# Patient Record
Sex: Male | Born: 1960
Health system: Southern US, Community
[De-identification: ages and names within clinical notes are randomized; demographics above are authoritative.]

## PROBLEM LIST (undated history)

## (undated) DIAGNOSIS — A630 Anogenital (venereal) warts: Secondary | ICD-10-CM

## (undated) DIAGNOSIS — D126 Benign neoplasm of colon, unspecified: Secondary | ICD-10-CM

## (undated) DIAGNOSIS — I482 Chronic atrial fibrillation, unspecified: Secondary | ICD-10-CM

## (undated) DIAGNOSIS — I517 Cardiomegaly: Secondary | ICD-10-CM

## (undated) DIAGNOSIS — Z7960 Long term (current) use of unspecified immunomodulators and immunosuppressants: Secondary | ICD-10-CM

## (undated) DIAGNOSIS — D649 Anemia, unspecified: Secondary | ICD-10-CM

## (undated) DIAGNOSIS — Z7901 Long term (current) use of anticoagulants: Secondary | ICD-10-CM

## (undated) DIAGNOSIS — I21A1 Myocardial infarction type 2: Secondary | ICD-10-CM

## (undated) DIAGNOSIS — N529 Male erectile dysfunction, unspecified: Secondary | ICD-10-CM

## (undated) DIAGNOSIS — G4730 Sleep apnea, unspecified: Secondary | ICD-10-CM

## (undated) DIAGNOSIS — Z974 Presence of external hearing-aid: Secondary | ICD-10-CM

## (undated) DIAGNOSIS — M109 Gout, unspecified: Secondary | ICD-10-CM

## (undated) DIAGNOSIS — I5042 Chronic combined systolic (congestive) and diastolic (congestive) heart failure: Secondary | ICD-10-CM

## (undated) DIAGNOSIS — J986 Disorders of diaphragm: Secondary | ICD-10-CM

## (undated) DIAGNOSIS — K859 Acute pancreatitis without necrosis or infection, unspecified: Secondary | ICD-10-CM

## (undated) DIAGNOSIS — M6530 Trigger finger, unspecified finger: Secondary | ICD-10-CM

## (undated) DIAGNOSIS — I2721 Secondary pulmonary arterial hypertension: Secondary | ICD-10-CM

## (undated) DIAGNOSIS — T7840XA Allergy, unspecified, initial encounter: Secondary | ICD-10-CM

## (undated) DIAGNOSIS — I350 Nonrheumatic aortic (valve) stenosis: Secondary | ICD-10-CM

## (undated) DIAGNOSIS — J189 Pneumonia, unspecified organism: Secondary | ICD-10-CM

## (undated) DIAGNOSIS — Z79899 Other long term (current) drug therapy: Secondary | ICD-10-CM

## (undated) DIAGNOSIS — K219 Gastro-esophageal reflux disease without esophagitis: Secondary | ICD-10-CM

## (undated) DIAGNOSIS — IMO0002 Reserved for concepts with insufficient information to code with codable children: Secondary | ICD-10-CM

## (undated) DIAGNOSIS — I255 Ischemic cardiomyopathy: Secondary | ICD-10-CM

## (undated) DIAGNOSIS — Z973 Presence of spectacles and contact lenses: Secondary | ICD-10-CM

## (undated) DIAGNOSIS — F329 Major depressive disorder, single episode, unspecified: Secondary | ICD-10-CM

## (undated) DIAGNOSIS — K5790 Diverticulosis of intestine, part unspecified, without perforation or abscess without bleeding: Secondary | ICD-10-CM

## (undated) DIAGNOSIS — Z9884 Bariatric surgery status: Secondary | ICD-10-CM

## (undated) DIAGNOSIS — D692 Other nonthrombocytopenic purpura: Secondary | ICD-10-CM

## (undated) DIAGNOSIS — E669 Obesity, unspecified: Secondary | ICD-10-CM

## (undated) DIAGNOSIS — G3184 Mild cognitive impairment, so stated: Secondary | ICD-10-CM

## (undated) DIAGNOSIS — F419 Anxiety disorder, unspecified: Secondary | ICD-10-CM

## (undated) DIAGNOSIS — N186 End stage renal disease: Secondary | ICD-10-CM

## (undated) DIAGNOSIS — I2510 Atherosclerotic heart disease of native coronary artery without angina pectoris: Secondary | ICD-10-CM

## (undated) DIAGNOSIS — F32A Depression, unspecified: Secondary | ICD-10-CM

## (undated) DIAGNOSIS — F39 Unspecified mood [affective] disorder: Secondary | ICD-10-CM

## (undated) DIAGNOSIS — R0600 Dyspnea, unspecified: Secondary | ICD-10-CM

## (undated) DIAGNOSIS — I872 Venous insufficiency (chronic) (peripheral): Secondary | ICD-10-CM

## (undated) DIAGNOSIS — I10 Essential (primary) hypertension: Secondary | ICD-10-CM

## (undated) DIAGNOSIS — Z940 Kidney transplant status: Secondary | ICD-10-CM

## (undated) DIAGNOSIS — E785 Hyperlipidemia, unspecified: Secondary | ICD-10-CM

## (undated) DIAGNOSIS — I700 Atherosclerosis of aorta: Secondary | ICD-10-CM

## (undated) DIAGNOSIS — J45909 Unspecified asthma, uncomplicated: Secondary | ICD-10-CM

## (undated) DIAGNOSIS — R011 Cardiac murmur, unspecified: Secondary | ICD-10-CM

## (undated) DIAGNOSIS — B259 Cytomegaloviral disease, unspecified: Secondary | ICD-10-CM

## (undated) HISTORY — DX: Venous insufficiency (chronic) (peripheral): I87.2

## (undated) HISTORY — DX: Essential (primary) hypertension: I10

## (undated) HISTORY — DX: Other nonthrombocytopenic purpura: D69.2

## (undated) HISTORY — DX: Dyspnea, unspecified: R06.00

## (undated) HISTORY — DX: Cytomegaloviral disease, unspecified: B25.9

## (undated) HISTORY — DX: Hyperlipidemia, unspecified: E78.5

## (undated) HISTORY — DX: Mild cognitive impairment of uncertain or unknown etiology: G31.84

## (undated) HISTORY — DX: Unspecified asthma, uncomplicated: J45.909

## (undated) HISTORY — DX: Chronic atrial fibrillation, unspecified: I48.20

## (undated) HISTORY — DX: Chronic combined systolic (congestive) and diastolic (congestive) heart failure: I50.42

## (undated) HISTORY — DX: Secondary pulmonary arterial hypertension: I27.21

## (undated) HISTORY — DX: Allergy, unspecified, initial encounter: T78.40XA

## (undated) HISTORY — DX: Male erectile dysfunction, unspecified: N52.9

## (undated) HISTORY — PX: BARIATRIC SURGERY: SHX1103

## (undated) HISTORY — DX: Diverticulosis of intestine, part unspecified, without perforation or abscess without bleeding: K57.90

## (undated) HISTORY — DX: Trigger finger, unspecified finger: M65.30

## (undated) HISTORY — DX: Gout, unspecified: M10.9

## (undated) HISTORY — PX: DG ANGIO AV SHUNT*L*: HXRAD382

## (undated) HISTORY — PX: VASECTOMY: SHX75

## (undated) HISTORY — DX: Nonrheumatic aortic (valve) stenosis: I35.0

## (undated) HISTORY — DX: Obesity, unspecified: E66.9

## (undated) HISTORY — DX: Benign neoplasm of colon, unspecified: D12.6

## (undated) HISTORY — DX: Reserved for concepts with insufficient information to code with codable children: IMO0002

## (undated) HISTORY — PX: CARDIAC CATHETERIZATION: SHX172

## (undated) HISTORY — DX: End stage renal disease: N18.6

## (undated) HISTORY — DX: Atherosclerotic heart disease of native coronary artery without angina pectoris: I25.10

---

## 1994-03-25 HISTORY — PX: CORONARY ANGIOPLASTY WITH STENT PLACEMENT: SHX49

## 2002-08-10 ENCOUNTER — Encounter: Payer: Self-pay | Admitting: Cardiology

## 2002-08-10 ENCOUNTER — Inpatient Hospital Stay (HOSPITAL_COMMUNITY): Admission: AD | Admit: 2002-08-10 | Discharge: 2002-08-24 | Payer: Self-pay | Admitting: Cardiology

## 2002-08-10 HISTORY — PX: CARDIAC CATHETERIZATION: SHX172

## 2002-08-11 ENCOUNTER — Encounter: Payer: Self-pay | Admitting: Cardiology

## 2002-08-12 ENCOUNTER — Encounter: Payer: Self-pay | Admitting: Thoracic Surgery (Cardiothoracic Vascular Surgery)

## 2002-08-13 ENCOUNTER — Encounter: Payer: Self-pay | Admitting: Thoracic Surgery (Cardiothoracic Vascular Surgery)

## 2002-08-13 DIAGNOSIS — Z951 Presence of aortocoronary bypass graft: Secondary | ICD-10-CM

## 2002-08-13 HISTORY — PX: CORONARY ARTERY BYPASS GRAFT: SHX141

## 2002-08-13 HISTORY — DX: Presence of aortocoronary bypass graft: Z95.1

## 2002-08-14 ENCOUNTER — Encounter: Payer: Self-pay | Admitting: Thoracic Surgery (Cardiothoracic Vascular Surgery)

## 2002-08-15 ENCOUNTER — Encounter: Payer: Self-pay | Admitting: Cardiothoracic Surgery

## 2002-08-16 ENCOUNTER — Encounter: Payer: Self-pay | Admitting: Thoracic Surgery (Cardiothoracic Vascular Surgery)

## 2002-08-16 ENCOUNTER — Encounter: Payer: Self-pay | Admitting: Nephrology

## 2002-08-18 ENCOUNTER — Encounter: Payer: Self-pay | Admitting: Thoracic Surgery (Cardiothoracic Vascular Surgery)

## 2002-08-20 ENCOUNTER — Encounter: Payer: Self-pay | Admitting: Thoracic Surgery (Cardiothoracic Vascular Surgery)

## 2002-09-03 ENCOUNTER — Encounter: Payer: Self-pay | Admitting: Cardiothoracic Surgery

## 2002-09-03 ENCOUNTER — Inpatient Hospital Stay (HOSPITAL_COMMUNITY): Admission: AD | Admit: 2002-09-03 | Discharge: 2002-09-08 | Payer: Self-pay | Admitting: Cardiothoracic Surgery

## 2002-09-04 ENCOUNTER — Encounter: Payer: Self-pay | Admitting: Thoracic Surgery (Cardiothoracic Vascular Surgery)

## 2002-09-06 ENCOUNTER — Encounter: Payer: Self-pay | Admitting: Thoracic Surgery (Cardiothoracic Vascular Surgery)

## 2002-09-07 ENCOUNTER — Encounter: Payer: Self-pay | Admitting: Cardiology

## 2002-09-19 ENCOUNTER — Ambulatory Visit (HOSPITAL_BASED_OUTPATIENT_CLINIC_OR_DEPARTMENT_OTHER): Admission: RE | Admit: 2002-09-19 | Discharge: 2002-09-19 | Payer: Self-pay | Admitting: Pulmonary Disease

## 2002-11-15 ENCOUNTER — Encounter
Admission: RE | Admit: 2002-11-15 | Discharge: 2002-11-15 | Payer: Self-pay | Admitting: Thoracic Surgery (Cardiothoracic Vascular Surgery)

## 2002-11-15 ENCOUNTER — Encounter: Payer: Self-pay | Admitting: Thoracic Surgery (Cardiothoracic Vascular Surgery)

## 2003-01-25 ENCOUNTER — Ambulatory Visit (HOSPITAL_COMMUNITY): Admission: RE | Admit: 2003-01-25 | Discharge: 2003-01-25 | Payer: Self-pay | Admitting: Cardiology

## 2003-01-26 ENCOUNTER — Inpatient Hospital Stay (HOSPITAL_COMMUNITY): Admission: AD | Admit: 2003-01-26 | Discharge: 2003-01-30 | Payer: Self-pay | Admitting: Nephrology

## 2003-01-29 ENCOUNTER — Encounter (INDEPENDENT_AMBULATORY_CARE_PROVIDER_SITE_OTHER): Payer: Self-pay | Admitting: *Deleted

## 2003-02-02 ENCOUNTER — Ambulatory Visit (HOSPITAL_COMMUNITY): Admission: RE | Admit: 2003-02-02 | Discharge: 2003-02-02 | Payer: Self-pay | Admitting: Nephrology

## 2003-02-03 ENCOUNTER — Inpatient Hospital Stay (HOSPITAL_COMMUNITY): Admission: RE | Admit: 2003-02-03 | Discharge: 2003-02-06 | Payer: Self-pay | Admitting: Nephrology

## 2003-02-03 ENCOUNTER — Encounter (INDEPENDENT_AMBULATORY_CARE_PROVIDER_SITE_OTHER): Payer: Self-pay | Admitting: *Deleted

## 2003-02-22 ENCOUNTER — Encounter: Admission: RE | Admit: 2003-02-22 | Discharge: 2003-02-22 | Payer: Self-pay | Admitting: Nephrology

## 2003-03-26 DIAGNOSIS — N186 End stage renal disease: Secondary | ICD-10-CM

## 2003-03-26 DIAGNOSIS — I2510 Atherosclerotic heart disease of native coronary artery without angina pectoris: Secondary | ICD-10-CM | POA: Insufficient documentation

## 2003-03-26 HISTORY — DX: Atherosclerotic heart disease of native coronary artery without angina pectoris: I25.10

## 2003-03-26 HISTORY — DX: End stage renal disease: N18.6

## 2003-03-26 HISTORY — PX: CORONARY ARTERY BYPASS GRAFT: SHX141

## 2003-04-05 ENCOUNTER — Ambulatory Visit (HOSPITAL_COMMUNITY): Admission: RE | Admit: 2003-04-05 | Discharge: 2003-04-05 | Payer: Self-pay | Admitting: Vascular Surgery

## 2003-04-22 ENCOUNTER — Encounter (HOSPITAL_COMMUNITY): Admission: RE | Admit: 2003-04-22 | Discharge: 2003-07-21 | Payer: Self-pay | Admitting: Nephrology

## 2004-05-28 ENCOUNTER — Ambulatory Visit: Payer: Self-pay | Admitting: Nephrology

## 2004-06-12 ENCOUNTER — Ambulatory Visit: Payer: Self-pay | Admitting: Nephrology

## 2005-04-18 ENCOUNTER — Ambulatory Visit: Payer: Self-pay | Admitting: Family Medicine

## 2006-05-15 ENCOUNTER — Other Ambulatory Visit: Payer: Self-pay

## 2006-05-15 ENCOUNTER — Emergency Department: Payer: Self-pay | Admitting: Emergency Medicine

## 2006-07-08 ENCOUNTER — Ambulatory Visit: Payer: Self-pay | Admitting: Internal Medicine

## 2007-01-15 ENCOUNTER — Ambulatory Visit: Payer: Self-pay | Admitting: *Deleted

## 2008-02-02 ENCOUNTER — Ambulatory Visit: Payer: Self-pay | Admitting: Unknown Physician Specialty

## 2008-02-23 ENCOUNTER — Encounter: Payer: Self-pay | Admitting: Internal Medicine

## 2008-02-29 ENCOUNTER — Encounter: Payer: Self-pay | Admitting: Internal Medicine

## 2008-03-01 ENCOUNTER — Encounter: Payer: Self-pay | Admitting: Internal Medicine

## 2008-03-07 ENCOUNTER — Ambulatory Visit: Payer: Self-pay | Admitting: Unknown Physician Specialty

## 2008-03-08 ENCOUNTER — Ambulatory Visit: Payer: Self-pay | Admitting: Unknown Physician Specialty

## 2008-07-18 ENCOUNTER — Emergency Department: Payer: Self-pay | Admitting: Emergency Medicine

## 2008-08-03 ENCOUNTER — Encounter: Payer: Self-pay | Admitting: Internal Medicine

## 2009-10-11 ENCOUNTER — Other Ambulatory Visit: Payer: Self-pay | Admitting: Nephrology

## 2010-03-07 ENCOUNTER — Inpatient Hospital Stay: Payer: Self-pay | Admitting: Internal Medicine

## 2010-03-09 ENCOUNTER — Encounter: Payer: Self-pay | Admitting: Internal Medicine

## 2010-03-25 DIAGNOSIS — Z940 Kidney transplant status: Secondary | ICD-10-CM

## 2010-03-25 HISTORY — PX: TYMPANIC MEMBRANE REPAIR: SHX294

## 2010-03-25 HISTORY — DX: Kidney transplant status: Z94.0

## 2010-04-05 ENCOUNTER — Ambulatory Visit: Admit: 2010-04-05 | Payer: Self-pay | Admitting: Family Medicine

## 2010-04-14 ENCOUNTER — Encounter: Payer: Self-pay | Admitting: Pulmonary Disease

## 2010-04-26 NOTE — Letter (Signed)
Summary: [facility]   Imported By: Edmonia James 03/20/2010 14:51:40  _____________________________________________________________________  External Attachment:    Type:   Image     Comment:   External Document  Appended Document: [facility] no longer my patient---please call [place] and have them take my name off his chart

## 2010-08-10 NOTE — Consult Note (Signed)
NAMESUFYAN, MEIDINGER                            ACCOUNT NO.:  [id number]   MEDICAL RECORD NO.:  [id number]                   PATIENT TYPE:  OIB   LOCATION:  2022                                 FACILITY:  [facility]   PHYSICIAN:  [name]. [name], M.D.              DATE OF BIRTH:  [birth year]   DATE OF CONSULTATION:  08/11/2002  DATE OF DISCHARGE:                                   CONSULTATION   REQUESTING PHYSICIAN:  Dr. Ethelle Lyon.   REASON FOR CONSULTATION:  Severe three-vessel coronary artery disease with  an abnormal stress Cardiolite exam.   HISTORY OF PRESENT ILLNESS:  The patient is a 50 year old obese male with  history of coronary artery disease, hypertension, type 2 diabetes mellitus,  and hyperlipidemia.  He originally presented with unstable angina in 1996  and underwent catheterization demonstrating high-grade stenosis of the mid  left anterior descending coronary artery.  He underwent percutaneous  coronary intervention and stenting of this vessel at that time.  This was in  [place], [place].  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  The patient has done  well since then, however.   Recently, the patient presented to Dr. Nyoka Cowden with an episode of productive  cough and shortness of breath at rest which awoke him from his sleep.  He  was noted to have a low-grade febrile illness and was treated for presumed  bronchitis.  A chest x-ray was performed at that time and demonstrated  enlarged cardiac silhouette.  The patient was counseled regarding the  potential need for cardiac stress test for risk stratification and followup.  He underwent a stress Cardiolite exam on Aug 05, 2002 at [facility].  This test was abnormal and notable for the development of chest pain  during administration of adenosine, as well as resting ejection fraction of  39% and hypoperfusion in the inferior wall from  base to apex, but also a  lesser degree of hypoperfusion in the distal septum.  The patient was  evaluated by Dr. Albertine Patricia and subsequently brought in for elective cardiac  catheterization.  This was performed yesterday and notable for the findings  of severe three-vessel coronary artery disease with 95% complex stenosis of  the left anterior descending coronary artery after takeoff of the large  first diagonal branch, with 70% restenosis in the stent which is placed  further downstream.  Left ventricular systolic function appears relatively  preserved.  The patient is felt not to be a candidate for percutaneous  intervention due to the complex nature of the left anterior descending  coronary stenosis and surgical consultation has been requested.   REVIEW OF SYSTEMS:  GENERAL:  The patient reports feeling relatively well,  although he has noted a tendency to get tired more easily and short of  breath more easily with activity.  He has a good appetite.  He has actually  lost 20 pounds in weight over the last 5 months, as he has started a recent  diet program.  CARDIAC:  Notable for the absence of any symptoms of chest  pain or chest tightness, either with activity or at rest.  The patient  reports mild exertional shortness of breath.  He denies problems with  resting shortness of breath, PND, orthopnea or palpitations.  The patient  denies any syncopal episodes.  He does have problems with worsening  bilateral lower extremity edema.  RESPIRATORY:  Notable only for exertional  shortness of breath.  The patient did have productive cough a couple of  weeks ago which resolved with a course of oral antibiotics prescribed by Dr.  Nyoka Cowden.  He denies any ongoing symptoms of productive cough, hemoptysis,  wheezing.  GASTROINTESTINAL:  Negative.  The patient reports no bowel  difficulties and specifically denies problems with constipation.  He has no  difficulty swallowing.  He reports no history of  hematochezia, hematemesis,  melena.  MUSCULOSKELETAL:  Negative.  The patient is up and on his feet all  day at work and denies problems with arthritis or arthralgias.  NEUROLOGIC:  Negative.  The patient does report occasional transient episodes of left arm  numbness which seem to occur at night when he is sleeping and he feels are  positional.  He otherwise denies any episodes of transient monocular  blindness or transient numbness or weakness involving either upper and lower  extremity.  HEENT:  Notable in that the patient has developed slow  progression of blurriness of vision in both eyes.  He has not had his eyes  checked in a long time.  He has no dental problems.  UROLOGIC:  Negative.  He denies any hematuria, dysuria, urinary urgency or frequency.  HEMATOLOGIC:  Negative.  The patient denies bleeding diathesis, frequent  oozing, or frequent episodes of epistaxis.  PSYCHIATRIC:  Negative.   PAST MEDICAL HISTORY:  Past medical history is as stated previously and  notable for history of hypertension, coronary artery disease, type 2  diabetes mellitus, hyperlipidemia.  The patient states he checks his blood  sugars every day at home and they have been under good control recently.  He  denies any history of congestive heart failure.   PAST SURGICAL HISTORY:  Past surgical history notable only for repair of  right common femoral artery pseudoaneurysm after cath in 1996.   FAMILY HISTORY:  Family history is notable for the absence of early-onset  coronary artery disease.   SOCIAL HISTORY:  The patient is married and lives with his wife.  They have  two children.  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  He has recently taken up racquetball.  He is a nonsmoker and denies any history of significant alcohol consumption.   MEDICATIONS PRIOR TO ADMISSION:  1. Avandia 8 mg p.o. once daily.  2. Glucophage 850 mg p.o. twice daily. 3.  Zocor 50 mg p.o. once daily.  4. Glucotrol XL 10 mg p.o. twice daily.  5. Norvasc 10 mg p.o. once daily.  6. Lopressor 50 mg p.o. twice daily.  7. Zestoretic 20/12.5 mg one tablet twice daily.  8. Diovan 320 mg p.o. once daily.  9. Aspirin 325 mg p.o. once daily.  10.      Lantus insulin each evening.   ALLERGIES:  The patient denies known drug allergies or sensitivities.  PHYSICAL EXAMINATION:  GENERAL:  Physical exam is notable for a well-  appearing, obese Hispanic male who appears his stated age, in no acute  distress.  He is 5-feet 11-inches tall and weighed 317 pounds on admission.  VITAL SIGNS:  His blood pressure has been elevated and recently measured  156/90.  He is in sinus rhythm.  HEENT:  Exam is essentially within normal limits.  NECK:  The neck is supple.  There is no cervical or supraclavicular  lymphadenopathy.  There is no jugular venous distention.  No carotid bruits  are noted.  CHEST:  Auscultation of the chest demonstrates clear and symmetrical breath  sounds bilaterally.  No wheezes or rhonchi are appreciated.  CARDIOVASCULAR:  Exam demonstrates regular rate and rhythm.  No murmurs,  rubs, or gallops are noted.  ABDOMEN:  The abdomen is obese, soft, and nontender.  Bowel sounds are  present.  There are no masses palpable.  EXTREMITIES:  The extremities are warm and well-perfused.  There is mild-to-  moderate bilateral lower extremity edema.  Distal pulses are easily palpable  in both lower legs at the ankles.  There is intact circulation of the palmar  arch on the left hand with brisk capillary refill during reperfusion while  maintaining occlusive pressure on the left radial pulse.  Skin is dry and  scaly and has changes of chronic diabetes and lower extremity edema in both  lower  legs.  There are no obvious varicose veins.  RECTAL AND GU:  Exam are both deferred.  NEUROLOGIC:  Examination is grossly nonfocal.   DIAGNOSTIC TESTS:  Cardiac catheterization  performed by Dr. Albertine Patricia is  reviewed.  This demonstrates complicated 42% stenosis of the mid left  anterior descending coronary artery just after takeoff of the first diagonal  branch.  There is 70% stenosis further downstream which is in-stent  restenosis in the previous stent.  There is 70% proximal stenosis of a large  first diagonal branch.  There is 80% stenosis of the distal left circumflex  coronary artery just before a very large circumflex marginal branch.  There  is 50-60% stenosis of the mid right coronary artery with right-dominant  coronary circulation.  Left ventricular function appears preserved with no  segmental wall motion abnormalities.  There is no mitral regurgitation.   IMPRESSION:  Severe three-vessel coronary artery disease with abnormal  stress Cardiolite exam.  The complicated high-grade stenosis of the left  anterior descending coronary artery would be relatively unfavorable for any sort of attempt at percutaneous coronary intervention.  I believe the  patient would best be treated with surgical revascularization.   PLAN:  I have outlined at length the indications, risks and potential  benefits of coronary artery bypass grafting with the patient, his wife, and  parents.  Alternative treatment strategies have been discussed.  They  understand and accept all associated risks of surgery including, but not  limited to, risks of death, stroke, myocardial infarction, respiratory  failure, pneumonia, congestive heart failure, acute renal failure,  arrhythmia, bleeding requiring blood transfusions, infections, recurrent  coronary artery disease.  All of their questions have been addressed.  We  tentatively plan to proceed with surgery on Friday, Aug 13, 2002, presuming  that the patient's renal function remains stable.  His serum creatinine  measures 1.8 at the time of admission and now it has increased to 1.9 today.  We will repeat this test tomorrow.  [name]. [name], M.D.    CHO/MEDQ  D:  08/11/2002  T:  08/12/2002  Job:  [id number]   cc:   Ethelle Lyon, M.D.   Hughie Closs.D.

## 2010-08-10 NOTE — Discharge Summary (Signed)
[name]                            ACCOUNT NO.:  [id number]   MEDICAL RECORD NO.:  [id number]                   PATIENT TYPE:  INP   LOCATION:  2005                                 FACILITY:   PHYSICIAN:  [name]. [name], M.D.              DATE OF BIRTH:  02/20/1961   DATE OF ADMISSION:  08/10/2002  DATE OF DISCHARGE:  08/24/2002                                 DISCHARGE SUMMARY   DISCHARGE DIAGNOSES:  1. Abnormal Cardiolite with exertional angina on admission.  2. Finding of severe three-vessel atherosclerotic coronary artery disease     with preserved left ventricular function.  3. Acute on chronic renal failure postoperatively, (ATN), nonoliguric,     resolving well by discharge.  4. Possible diabetic/hypertensive nephropathy.  5. Obstructive sleep apnea/obesity hypoventilation syndrome postoperatively     prolonging pulmonary recovery.  6. Acute blood loss anemia postoperatively requiring transfusions with a     coagulopathy noted immediately postoperatively resolving with transfusion     of packed red blood cells, two units of fresh frozen plasma and one eight     pack of platelets.   SECONDARY DIAGNOSES:  1. Hypertension.  2. Type 2 diabetes.  3. Morbid obesity.  4. Dyslipidemia.  5. History of coronary artery disease, status post coronary stenting of the     left anterior descending with left heart catheterization in 1996.  6. 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   PROCEDURE:  1. Aug 10, 2002.  Left heart catheterization, left ventriculogram and     coronary angiography by Dr. Sabino Snipes.  In the study, the left     anterior descending had a complex stenosis beginning immediately after     the single large diagonal.  A stent just downstream from the diagonal has     approximately 70% in-stent restenosis.  The left circumflex is a moderate-     size vessel with one large obtuse marginal.  There is an 80% stenosis in   the proximal portion of the marginal.  The right coronary artery is     dominant and has a 70% stenosis at mid vessel.  Renal arteries were     imaged and both were normal.  2. Aug 11, 2002.  Carotid duplex ultrasound.  Study was negative for     internal carotid artery stenosis bilaterally.  Ankle brachial indexes     were obtained and they were greater than 1.0 bilaterally.  3. Aug 13, 2002.  Coronary artery bypass graft surgery x4.  Dr. Darylene Price, surgeon. In this procedure the left internal mammary artery was     connected to the inside fashion to the left anterior descending coronary     artery.  The left radial artery was harvested and used for bypass to the     circumflex marginal.  Reversed  saphenous vein grafts were fashioned from  the aorta first to the diagonal and a second saphenous vein graft     fashioned from the aorta to the right coronary artery.  The patient     tolerated the procedure well and as mentioned above he did have     postoperative coagulopathy responding to one unit of packed red blood     cells, two units of fresh frozen plasma and one eight pack of platelets.   DISPOSITION:  [name] is ready for discharge postoperative day #11.  At the current time, he is doing very well.  He is on room air and has been  on room air since postoperative day #5.  His creatinine at the time of  discharge is 2.2.  His baseline was 1.8.  His zenith creatinine was 8.3.  On  postoperative day #3, he was seen by the renal service in consultation.  He  was hemodialyzed with CVVHD on postoperative day #2 for creatinine of 6.9.  He did not require any further hemodialysis this hospitalization. After  creatinine peak at postoperative day #3, it slowly trended downward.  He has  tolerated diuresis for volume overload and has had potassium correction  several times.  At the time of discharge, he is afebrile and he has  maintained a sinus rhythm in the postoperative  period.  His incisions are  healing fairly well although there are some necrotic skin edges at the left  forearm where the radial artery was harvested and this will bear monitoring.  He is ambulating independently.  His appetite is good.  He has full bowel  function.  He goes home with the following medications:  1. Oxycodone 5 mg one to two tablets every four to six hours p.r.n. pain.  2. Enteric coated aspirin 81 mg daily.  3. Protonix 40 mg daily.  4. Ferrous sulfate 325 mg daily with food.  5. Zocor 40 mg daily, preferably at bedtime.  6. Glucotrol 10 mg twice daily.  7. Imdur 30 mg daily for one month.  8. Norvasc 10 mg daily.  9. Metoprolol 50 mg p.o. b.i.d.  10.      Colace 100 mg two tabs at bedtime.  11.      Lantus 20 units at bedtime.  12.      He is asked to hold his Avandamet/lisinopril/Diovan at this time.   DISCHARGE ACTIVITIES:  Walk daily to build up his strength. He is asked not  to lift more than 10 pounds for six weeks.  He is not to drive until he sees  Dr. [name] in the office.   DISCHARGE DIET:  Low sodium, low cholesterol, ADA diet.  He may shower  daily. He is to call Dr. Guy Sandifer office if his incisions became inflamed,  break open or start actively draining.  He has several followup  appointments.  First, he will see Dr. Sabino Snipes at [facility]  Thursday June 24 at 10:30 in the morning.  He will  see Dr. Darylene Price  June 28, Monday at noon.  He is to bring a chest x-ray from [facility].  He will have a sleep study Sunday, September 19, 2002 at 8:30 in the  evening and they will mail information to [name].  He is to see Dr.  Braulio Conte after the sleep study on September 28, 2002 at 2:45 in the afternoon and  he will be seen by the  [facility] postoperatively  as an  outpatient.  He is to call [phone] to arrange the appointment.   BRIEF HISTORY:  [name] is a 50 year old obese male with a history of coronary artery disease.   He originally presented with unstable angina in  1996.  He underwent left heart catheterization which showed a high grade  stenosis in the mid left anterior descending coronary artery.  He underwent  percutaneous transluminal coronary angioplasty and stenting of the vessel at  that time.  His postprocedure progress was complicated by a right common  femoral artery pseudoaneurysm which required surgical repair.  Recently, the  patient presented to his primary care giver, Dr. Silvio Pate with an episode of  productive cough and shortness of breath at rest which awoke him from sleep.  He is noted to have a low grade febrile illness and was treated for presumed  bronchitis.  Chest x-ray was performed and showed an enlarged cardiac  silhouette.  The patient  was counseled regarding potential need for cardiac  stress test.  He underwent a stress Cardiolite exam on Aug 05, 2002 at  [facility].  The test was abnormal and the patient developed chest  pain during administration of adenosine.  He had a resting ejection fraction  at that time of 39% and hypoperfusion of the interior wall from base to  apex.  There was a lesser degree of hypoperfusion in the distal septum.  The  patient was evaluated by Dr. Albertine Patricia and will be scheduled for elective  cardiac catheterization for Aug 10, 2002.   HOSPITAL COURSE:  [name] was admitted to [facility] on  Aug 10, 2002. He underwent left heart catheterization the same day.  This  demonstrated severe three-vessel atherosclerotic coronary artery disease  with preserved left ventricular function.  He was seen by Dr. Darylene Price  in consultation for revascularization. The risks and benefits of the  procedure were prescribed to [name] and he elected to undergo this  procedure. This was done on May 21.  Four bypasses were placed.  The left  radial artery was used.  He had a postoperative coagulopathy requiring  transfusion of platelets,  fresh frozen plasma and red blood cells.  In the  immediate postoperative period, it was noted that his creatinine was rising.  His admission creatinine baseline was 1.8. He was seen in consultation by  the renal service for acute renal failure, nonoliguric.  He underwent acute  hemodialysis on postoperative day #2 with a creatinine of 6.9.  He did not  require any further hemodialysis, but was followed by the renal physicians  for his entire postoperative period for management of diuresis for volume  overload.  They will see him postoperatively.  He also had difficulty with  postoperative oxygenation.  His postoperative course was complicated by  obstructive sleep apnea and obesity and hypoventilation syndrome.  This  prolonged his pulmonary recovery.  He was seen in consultation by Dr. Danton Sewer who recommended BiPAP and an outpatient sleep study.  He was also  maintained on minimum sedation and active pulmonary toilet.  He was relieved of all supplemental oxygen support by postoperative day #5. He did have  acute blood loss anemia postoperatively and required two units of packed red  blood cells on postoperative day #3 for hemoglobin of 7.9 and a further unit  of packed red blood cells on postoperative day #4.  His hemoglobin has  stabilized after that.  He was maintained on total parenteral nutrition on  postoperative  day #4 secondary to his respiratory status.  This was  discontinued after 24 hours and the patient has been taking oral nourishment  well since that time.  [name] discharge is on August 24, 2002 with the  medications and followup as dictated above.  He is asked to hold his  Avandamet/lisinopril and Diovan until he sees Dr. Albertine Patricia in the office.     Sueanne Margarita, P.A.                    [name]. [name], M.D.    GM/MEDQ  D:  08/24/2002  T:  08/24/2002  Job:  [id number]   cc:   Dr. [name]   [facility]   Sleep study lab at [place], M.D. [facility]

## 2010-08-10 NOTE — Discharge Summary (Signed)
[name], [name]                            ACCOUNT NO.:  [id number]   MEDICAL RECORD NO.:  [id number]                   PATIENT TYPE:  INP   LOCATION:  2033                                 FACILITY:  [facility]   PHYSICIAN:  [name]. [name], M.D.              DATE OF BIRTH:  November 23, 1960   DATE OF ADMISSION:  09/03/2002  DATE OF DISCHARGE:  09/08/2002                                 DISCHARGE SUMMARY   PRIMARY ADMITTING DIAGNOSIS:  Shortness of breath.   ADDITIONAL/DISCHARGE DIAGNOSES:  1. Congestive heart failure.  2. Left pleural effusion.  3. Acute on chronic renal failure.  4. Type 2 diabetes mellitus.  5. Obesity.  6. Status post coronary artery disease status post coronary artery bypass     grafting Aug 13, 2002.  7. Hyperlipidemia.  8. Sleep apnea.  9. Postoperative anemia.  10.      Probable diabetic nephropathy.   HISTORY OF PRESENT ILLNESS:  The patient is a 50 year old Hispanic male who  is status post recent coronary artery bypass grafting on Aug 13, 2002.  He  returned to the CVTS office for follow-up on the date of this admission  complaining of shortness of breath which had progressively worsened over the  48 hours prior to this admission.  He also had associated lower extremity  edema.  He was seen in the office and was admitted for further evaluation  and treatment.   HOSPITAL COURSE:  He was admitted on September 03, 2002.  Chest x-ray was  performed which showed a large left pleural effusion.  He underwent a left  needle thoracentesis and approximately 2650 mL of serous fluid was drained.  He tolerated this without difficulty.  He was started on diuresis as well as  receiving his regular home medications.  He was also noted on his admission  labs to have an elevation of his white blood cell count to 13,000.  He was  started empirically on Avalox and was monitored closely.  His renal function  worsened following the initiation of his diuretics.  His creatinine  increased to 2.3.  A renal consult was obtained.  It was felt that he most  likely had a diabetic nephropathy which was causing the superimposed acute  on chronic renal failure.  It was recommended that he continue diuresis and  should undergo further work-up as an outpatient.  He was also seen in  consultation by cardiology who agreed with diuresis as well as addition of  an ACE inhibitor.  He continued to diurese well.  His shortness of breath  improved and his edema also improved.  His renal function stabilized and his  creatinine began to trend back downward and leveled off at 2.0 with BUN of  25.  Otherwise he has done well this admission.  A 2-D echocardiogram  was  performed which showed an ejection fraction between 50 and 55% with  mild  septal hypokinesis.  He remained stable otherwise.  His vital signs were  stable and he remained afebrile.  Surgical incision sites are healing well.  He is tolerating a regular diet and is having normal bowel and bladder  function.  He has been ambulating without difficulty and his saturations  have remained greater than 90% on room air.  He has undergone the collection  of a 24-hour urine specimen, the results of which will be reviewed and  recommendations made following discharge.  It is felt by all parties  involved in his care that he may be discharged home at this time with close  outpatient follow-up.   DISCHARGE MEDICATIONS:  1. Lasix 40 mg b.i.d. x7 days, then 40 mg daily thereafter.  2. K-Dur 20 mEq b.i.d. x7 days, then 20 mEq daily thereafter.  3. Norvasc 5 mg daily.  4. Avalox 400 mg daily x1 week.  5. Enteric coated aspirin 81 mg daily.  6. Protonix 40 mg daily.  7. Zocor 40 mg daily.  8. Lopressor 50 mg b.i.d.  9. Lantus 20 units daily.  10.      Glucotrol 10 mg b.i.d.  11.      Ferrous sulfate 325 mg daily.  12.      Tylox one to two q.4h. p.r.n. for pain.   DISCHARGE INSTRUCTIONS:  He is to refrain from driving, heavy lifting  or  strenuous activity.  He will continue a low fat, low sodium diet.  He may  shower daily and clean his incisions with soap and water.   FOLLOW UP:  He has a previously scheduled follow-up appointment with Dr.  Albertine Patricia on Thursday, September 16, 2002, at 10:30 a.m.  He will have a chest x-ray  at this visit.  He will also follow up with Dr. [name] on Monday, September 20, 2002, at noon and should bring his chest x-ray to Dr. [name] to review.  He is  asked to call and schedule a follow-up appointment the next week with  [facility].  A completion of his renal failure work-up will occur at  this visit.  Also previous labs performed in the hospital but results  pending at the time of discharge will be reviewed with him.  He is asked to  call our office if he experiences any problems or has any questions in the  interim.     [name], P.A.                        [name]. [name], M.D.    GC/MEDQ  D:  09/21/2002  T:  09/21/2002  Job:  [id number]   cc:   Ethelle Lyon, M.D.   Dr. Drenda Freeze, [place]    cc:   Ethelle Lyon, M.D.   Dr. Drenda Freeze, [place]

## 2010-08-10 NOTE — Consult Note (Signed)
[name], [name]                            ACCOUNT NO.:  [id number]   MEDICAL RECORD NO.:  [id number]                   PATIENT TYPE:  INP   LOCATION:  2314                                 FACILITY:  [facility]   PHYSICIAN:  Sol Blazing, M.D.             DATE OF BIRTH:  1961/03/20   DATE OF CONSULTATION:  DATE OF DISCHARGE:                                   CONSULTATION   REASON FOR CONSULTATION:  Rising creatinine post-CABG.   HISTORY OF PRESENT ILLNESS:  The patient is a 50 year old obese male with  history of coronary artery disease, hypertension, diabetes, and  hyperlipidemia.  He had a heart catheterization with stenting in 1996.  He  more recently had an abnormal Cardiolite and heart catheterization done on  Aug 10, 2002 showed severe three vessel disease.  He is currently stable  with a baseline creatinine of 1.8 which was stable postcatheterization.  He  underwent three vessel bypass grafting on Aug 13, 2002.   Postoperatively, his creatinine has risen over the past 48 hours from 3.4  yesterday to 6 today.  He is making about 100 to 200 cc of urine per shift.  Yesterday prior to [facility] removal, showed a PAD of 18 to 30.  The patient is  on BiPAP mostly for sleep apnea and obesity.  X-ray shows a small lung  volume.  There is no CHF.   PAST MEDICAL HISTORY:  As above with a history of right common femoral  artery pseudoaneurysm after a heart catheterization in 1996.   FAMILY HISTORY:  No renal disease.   SOCIAL HISTORY:  The patient is married and lives with his wife.  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  Does not  smoke.  No significant alcohol.   MEDICATIONS:  Avandia, Glucophage, Zocor, Glucotrol XL, Norvasc, Lopressor,  Zestoretic 20/1/12.5 b.i.d., Diovan 320 mg q.d., aspirin q.d., and Lantus  every evening.   CURRENT MEDICATIONS:  Pepcid, Protonix, Lantus, Albuterol, Atrovent, Lasix,  Maxipime, vancomycin, Lovenox.   ALLERGIES:  None.   REVIEW OF SYMPTOMS:  Not available.   PHYSICAL EXAMINATION:  GENERAL:  The patient is an obese Hispanic male on  BiPAP.  VITAL SIGNS:  Weight is 330 pounds.  It was 325 preoperatively.  Blood  pressure is 130/80, pulse is 110, respirations are 24.  O2 saturation is 95%  on 50% BiPAP.  SKIN:  Without rashes.  HEENT:  PERRL.  EOMI.  Throat is moist and clear.  NECK:  Obese.  CHEST:  Clear anteriorly and laterally.  CARDIOVASCULAR:  Regular rate and rhythm without rub or murmur.  ABDOMEN:  Soft, obese, and nontender.  EXTREMITIES:  There is 2+ edema of all four extremities.  NEUROLOGICAL:  Moves all four extremities equally.  Grossly nonfocal exam.   LABORATORY DATA:  Sodium 135, potassium 4.7, CO2 24, BUN 48, creatinine 6.9.  White blood count 23,000, hemoglobin 8.5, platelets 169, pH 7.34, pCO2 41,  pO2 74.  Chest x-ray as noted above.   IMPRESSION:  1. Acute on chronic renal failure due to postoperative acute tubular     necrosis after coronary artery bypass graft.  2. Chronic renal failure probably due to diabetic nephropathy with heavy     proteinuria on dipstick. Baseline creatinine 1.8.  3. Heart catheterization, May 18, status post coronary artery bypass graft,     May 21.  4. Diabetes.  5. Obesity hypoventilation syndrome.  6. Hypertension.  7. Edema and volume overload.   PLAN:  1. We will place femoral catheter for acute dialysis today.  Remove to 2 to     3 kg with no heparin.  2. Thereafter if the need arises, he may have further intermittent dialysis     or possibly CVHD if his blood pressure becomes an issue.                                               Sol Blazing, M.D.    RDS/MEDQ  D:  08/15/2002  T:  08/15/2002  Job:  [id number]   cc:   [name]. [name], M.D.  [address]  [place]  [place] [address]  Fax: [phone]

## 2010-08-10 NOTE — Op Note (Signed)
   NAME:  [name], [name]                          ACCOUNT NO.:  [id number]   MEDICAL RECORD NO.:  [id number]                   PATIENT TYPE:  OIB   LOCATION:  5511                                 FACILITY:  [facility]   PHYSICIAN:  Windy Kalata, M.D.          DATE OF BIRTH:  [birth year]   DATE OF PROCEDURE:  02/03/2003  DATE OF DISCHARGE:                                 OPERATIVE REPORT   PROCEDURE:  Left percutaneous renal biopsy.   SURGEON:  Windy Kalata, M.D.   REASON FOR BIOPSY:  Acute renal failure.   INFORMED CONSENT:  The procedure was explained to the patient and informed  consent obtained.  The patient was placed in the prone position and left  flank was prepped and draped in a sterile fashion.  Local anesthesia was  obtained with 15 mL of 1% Xylocaine.   DESCRIPTION OF PROCEDURE:  Under direct ultrasound guidance, a 15 gauge ASAP  biopsy needle was placed in the inferior fold of the kidney on three  separate occasions with return of three courses of renal tissue.  The  patient tolerated the procedure well.                                               Windy Kalata, M.D.    MTM/MEDQ  D:  02/03/2003  T:  02/03/2003  Job:  [id number]

## 2010-08-10 NOTE — H&P (Signed)
NAMERAYMON, SCHLARB                            ACCOUNT NO.:  [id number]   MEDICAL RECORD NO.:  [id number]                   PATIENT TYPE:  INP   LOCATION:  2033                                 FACILITY:  [facility]   PHYSICIAN:  [name]. [name], M.D.              DATE OF BIRTH:  11/09/1960   DATE OF ADMISSION:  09/03/2002  DATE OF DISCHARGE:                                HISTORY & PHYSICAL   CHIEF COMPLAINT:  Swelling and shortness of breath.   REFERRING PHYSICIAN:  Ethelle Lyon, M.D.   BRIEF HISTORY:  The patient is a 50 year old Hispanic male status post  coronary artery bypass grafting x5 Aug 13, 2002; also has a problem of adult  onset diabetes mellitus, acute and chronic renal failure, and obesity.  The  patient presented today with swelling and shortness of breath which became  severe and most notable Wednesday, September 01, 2002.  It has become more severe  over the last 48 hours and he finally came to our office for help.  The  patient notes that his CBG's at home have been in the 95-140 range.  He was  discharged home on  August 24, 2002.  Cardiac catheterization done prior to his CABG showed a 95%  LAD, a 70% diagonal, an 80% circumflex, a 50%-60% RCA with a normal EF.  Additional medical problems include adult onset diabetes mellitus, insulin  dependent on Lantus since 1996.  Hypertension since 1996.  Dyslipidemia,  coronary artery disease with prior stent placement in 1996.  He has sleep  apnea and hypoventilation syndrome secondary to his obesity.   PAST SURGICAL HISTORY:  1. PTCA and stent in 1996.  2. Right common femoral pseudoaneurysm repair and coronary artery bypass     grafting Aug 13, 2002.   CURRENT MEDICATIONS:  1. Ecotrin 81 mg daily.  2. Protonix 40 mg daily.  3. Ferrous sulfate 325 mg daily.  4. Zocor 40 mg daily.  5. Imdur 30 mg daily.  6. Norvasc 10 mg daily.  7. Lopressor 50 mg p.o. b.i.d.  8. Colace 2 p.o. daily.  9. Lantus 20 units subcu q.a.m.  10.      OxyContin 5 mg p.r.n.   ALLERGIES:  None.   REVIEW OF SYSTEMS:  The patient's weight on admission is 345 pounds.  Prior  to discharge, it was 329.  He has had this swelling since his surgery but it  has become worse.  He has a history of diabetes and kidney disease as noted  above.  No history of asthma or COPD.  No history of stroke or TIA's.  No  history of arrhythmias.  He did not have an MI.  No history of pulmonary  embolus or DVT.  No history of GI bleed.  No dysuria or hematuria.  He has  positive GERD.  He has congestive heart failure and hypertension.  He has  shortness of breath, dyspnea on exertion, PND, and orthopnea.  He does not  have a cough.  He has some problems with constipation.   FAMILY HISTORY:  Mother is living at age 75 in good health.  He has one  grandmother on his maternal side with diabetes.  He has a 79 year old father  who has hypertension who has had a kidney transplant, etiology is uncertain.  He has no brothers or sisters.   SOCIAL HISTORY:  He has been married for 10 years and has two children.  He  works as an Glass blower/designer.  He denies alcohol use or tobacco use.   PHYSICAL EXAMINATION:  GENERAL:  This is an obese 50 year old Hispanic male  with moderate dyspnea, O2 saturations in the 90s on 2-3 L nasal cannula.  VITAL SIGNS:  His blood pressure was in the 638V systolic when he first came  in.  It is down to the 180s now.  The pulse was 96 and regular.  HEENT:  Head:  Normocephalic.  Eyes:  PERLA.  EOM's intact.  Fundi not  visualized.  Ears, nose, throat, and mouth:  Grossly within normal limits.  NECK:  Supple.  No JVD, no bruits, no thyromegaly.  CHEST:  Clear to auscultation on the right, decreased breath sounds on the  left.  No wheezes, rhonchi, or rales.  CARDIAC:  Regular rate and rhythm.  No murmurs, rubs, or gallops.  The chest  incision is healing.  The chest tube sites are healing although the middle  chest tube site shows need  for some more granulation.  ABDOMEN:  Soft, nontender, bowel sounds are positive.  No masses or palpable  organomegaly.  GENITOURINARY/RECTAL:  Deferred.  EXTREMITIES:  No clubbing or cyanosis.  He has +2 edema in both lower  extremities.  Skin shows no ulcerations.  Pulses are +2 and equal throughout  the distribution.  NEUROLOGIC:  No focal deficits.   IMPRESSION:  1. Shortness of breath and swelling with a large left pleural effusion noted     on chest x-ray.  2. Status post coronary artery bypass grafting of the left internal mammary     artery to the left anterior descending, left radial to the circumflex     marginal, saphenous vein graft to the right coronary artery and diagonal.  3. Hypertension.  4. Adult onset diabetes mellitus, insulin dependent, type 2.  5. Morbid obesity with hypoventilation syndrome and possible sleep apnea.  6. Dyslipidemia.  7. Chronic renal insufficiency with acute renal failure after coronary     artery bypass graft, now resolved.   PLAN:  We plan to diurese the patient.  He will most likely undergo a  thoracentesis with further treatment as indicated.     Lydia Guiles, P.A.                 [name]. [name], M.D.    WDJ/MEDQ  D:  09/03/2002  T:  09/04/2002  Job:  [id number]

## 2010-08-10 NOTE — Consult Note (Signed)
[name], [name]                            ACCOUNT NO.:  [id number]   MEDICAL RECORD NO.:  [id number]                   PATIENT TYPE:  INP   LOCATION:  2033                                 FACILITY:  [facility]   PHYSICIAN:  Jacqulyn Ducking, M.D.               DATE OF BIRTH:  Aug 16, 1960   DATE OF CONSULTATION:  DATE OF DISCHARGE:                                   CONSULTATION   REFERRING PHYSICIAN:  [name]. [name], M.D.   PRIMARY CARDIOLOGIST:  Ethelle Lyon, M.D.   HISTORY OF PRESENT ILLNESS:  A 50 year old gentleman who recently underwent  CABG surgery and returned to the hospital with progressive dyspnea, fluid  retention, and a large pleural effusion.  [name] has improved  substantially with a 5-pound diuresis and drainage of his left pleural  effusion.  There is minimal laboratory data concerning that fluid - the  culture is negative to date.  With initial diuresis, there has been some  increase in creatinine from an admission value of 1.8 to a current value of  2.2.  creatinine was 1.7 when the patient first presented to hospital in  May.  [name] reports a marked improvement in his dyspnea.  There has  been no postoperative study of left ventricular function.  EF was 0.40-0.45  prior to cardiac surgery.   PAST MEDICAL HISTORY:  1. Notable for chronic renal insufficiency, as described above.  Serum     albumin was 2.3 on the patient's initial admission.  Urinalysis has been     positive for proteinuria.  I see no quantification of renal function or     proteinuria by a 24-hour collection.   1. The patient suffered a pseudoaneurysm prior to his recent operation,     which required surgical repair at the time of CABG.   1. He has had hypertension since 50.   1. Insulin-dependent diabetes was discovered at the same time.   1. He has a long history of obesity.   1. He has had dyslipidemia that has been treated since his initial     presentation with  coronary disease in 1996, at which time a stent was     placed.   1. He has a history of sleep apnea and hypoventilation related to obesity.   MEDICATIONS ON DISCHARGE:  1. Oxycodone.  2. Aspirin 81 mg q.d.  3. Protonix 40 mg q.d.  4. An iron supplement.  5. Simvastatin 40 mg q.d.  6. Glucotrol 10 mg b.i.d.  7. Isosorbide mononitrate 30 mg q.d. for one month.  8. Amlodipine 10 mg q.d.  9. Metoprolol 50 mg b.i.d.  10.      Lantus insulin 20 units q.h.s.  11.      Colace 200 mg q.d.   HOSPITAL COURSE:  Following coronary artery bypass surgery, the patient  developed acute and chronic renal insufficiency requiring a few  dialysis  procedures.  His renal function subsequently recovered to near baseline.   ALLERGIES:  None.   FAMILY HISTORY:  Positive for diabetes, hypertension and chronic renal  insufficiency - his father required renal transplantation.   SOCIAL HISTORY:  Married for 10 years.  Two children.  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  Denies alcohol or tobacco use.   REVIEW OF SYSTEMS:  Obesity has been chronic.  The patient reported his  weight as 345 prior to CABG surgery, but the hospital weight was 330.  Exercise tolerance was good before the operation - he has been fairly  inactive since surgery.  He has had some mild chronic edema that has been  worse recently.   PHYSICAL EXAMINATION:  GENERAL:  A pleasant alert overweight gentleman in no  acute distress.  VITAL SIGNS:  The weight is 340 pounds, 5 pounds less than on admission.  Blood pressure 140/85, heart rate 75 and regular, respirations 16, oxygen  saturation 92% on room air.  HEENT:  Anicteric sclerae.  NECK:  Mild jugular venous distention.  Normal carotid upstrokes without  bruits.  CHEST:  Stable sternum with healing mediastinotomy scar.  LUNGS:  Decreased breath sounds at the left base.  CARDIAC:  Normal first and second heart sounds.  Fourth heart sound present.  ABDOMEN:  Soft and nontender.   No organomegaly.  EXTREMITIES:  Distal pulses intact, 1+ ankle edema.   LABORATORY DATA:  EKG:  None available for review.   Chest x-ray:  Scattered infiltrates in the left lung with compression due to  a large left pleural effusion.  Subsequent post-tap film demonstrated  resolution of effusion.   IMPRESSION:  [name] has multiple medical problems, including diabetes,  chronic renal insufficiency, hypertension, dyslipidemia, and coronary  disease.  His symptoms were principally related to a large left pleural  effusion.  The relative contribution of his recent cardiac surgery versus  congestive heart failure is uncertain.  He has benefitted from diuresis.  His current dose of furosemide is appropriate at 40 mg b.i.d.  Amlodipine  may be contributing to peripheral edema.  His dose will be decreased to 5 mg  q.d.  An ACE inhibitor would be beneficial, at least with respect to  progression of renal disease in the setting of multiple acute problems.  Initiation of his medication will be deferred until after discharge.  An  echocardiogram will be obtained to verify that there has been no decrease in  left ventricle systolic function following cardiac surgery.   Controlled diabetes can be reassessed with a hemoglobin A1c level.  CBG  Levels have been quite low.   Chronic renal insufficiency is relatively stable.  The patient is  hypoalbuminemic with edema and renal insufficiency.  A 24-hour urine  collection will be obtained to quantify creatinine clearance and protein  excretion.    FOLLOW UP:  [name] already has an appointment to see Dr. Albertine Patricia in the  office.  We agree with the CVTS impression that further management can be  undertaken on an outpatient basis.                                                Jacqulyn Ducking, M.D.    RR/MEDQ  D:  09/06/2002  T:  09/06/2002  Job:  [id number]

## 2010-08-10 NOTE — Cardiovascular Report (Signed)
NAMEALEKS, NAWROT                            ACCOUNT NO.:  [id number]   MEDICAL RECORD NO.:  [id number]                   PATIENT TYPE:  OIB   LOCATION:  2022                                 FACILITY:  [facility]   PHYSICIAN:  Ethelle Lyon, M.D.             DATE OF BIRTH:  12-20-1960   DATE OF PROCEDURE:  08/10/2002  DATE OF DISCHARGE:                              CARDIAC CATHETERIZATION   PROCEDURE:  Left heart catheterization, left ventriculography, coronary  angiography, abdominal aortography without run-off, selective right renal  angiography.   INDICATION:  Mr. Gronau is a 50 year old gentleman with hypertension,  diabetes mellitus, dyslipidemia and obesity as well as coronary disease.  He  is status post stenting of his mid  LAD in 5 in [place], [place].  He now  presents having had an episode of acute onset dyspnea approximately 10 days  ago.  Dr. Silvio Pate ordered exercise tolerance test which was markedly positive  and was referred for diagnostic angiography.  The patient also has abnormal  creatinine at 1.8 with blood pressure poorly controlled despite five  medications.  Plan was therefore to proceed with renal angiography at the  time of the coronary angiography.   DIAGNOSTIC TECHNIQUE:  Informed consent was obtained.  Under 1% lidocaine  local anesthesia, a 6 French sheath was placed in the left femoral artery  using the modified Seldinger technique.  Diagnostic angiography and  ventriculography were performed using JL-4, JR-4, and pigtail catheters.  The pigtail catheter was then pulled back to the suprarenal abdominal aorta.  Abdominal aortography was performed by power injection.  There was a  questionable stenosis at the ostium of the right renal artery.  Therefore,  this artery was selectively engaged using the JR-4 catheter.  Selective  right renal angiography was then performed by hand injection.  The patient  tolerated the procedure well and was transferred to  the holding room in  stable condition.  Sheaths are to be removed there.   COMPLICATIONS:  None.   FINDINGS:  1. LV:  187/21/36. Unable to assess ejection fraction and mitral     regurgitation due to ventricular ectopy.  2. No aortic stenosis.  3. Left main:  Angiographically normal.  4. LAD:  The LAD is a large vessel giving rise to a single large diagonal     branch.  There is a 95% complex stenosis beginning immediately after the     diagonal.  The stent just downstream from diagonal has approximately 70%     in-stent restenosis.  5. Circumflex:  The circumflex is a moderate-size vessel giving rise to one     large obtuse marginal.  There is an 80% stenosis in the proximal portion     of his marginal.  6. RCA:  The RCA is  large dominant vessel.  There is a complex 70% stenosis     of the mid vessel.  7.  Single renal arteries bilaterally. Both are normal.   IMPRESSION/RECOMMENDATIONS:  A 50 year old gentleman with severe multivessel  disease in the setting of diabetes mellitus.  Will refer him to Dr. [name] for  consideration of coronary artery bypass grafting.                                               Ethelle Lyon, M.D.    WED/MEDQ  D:  08/10/2002  T:  08/11/2002  Job:  [id number]

## 2010-08-10 NOTE — Consult Note (Signed)
[name], [name]                            ACCOUNT NO.:  [id number]   MEDICAL RECORD NO.:  [id number]                   PATIENT TYPE:  INP   LOCATION:  2314                                 FACILITY:  [facility]   PHYSICIAN:  Danton Sewer, M.D. [facility]              DATE OF BIRTH:  24-Mar-1961   DATE OF CONSULTATION:  08/18/2002  DATE OF DISCHARGE:                                   CONSULTATION   HISTORY OF PRESENT ILLNESS:  The patient is a 50 year old gentleman who I  have been asked to see for possible obesity hypoventilation syndrome and  obstructive sleep apnea.  On this admission, the patient was found to have  significant coronary artery disease and is now status post CABG x 4 on Aug 13, 2002.  This has been complicated by acute renal failure, as well as  respiratory infection.  The patient is morbidly obese and has mild  hypercarbia.  He was noted to have elevated systolic pulmonary artery  pressures while a Swan was in place in the range of 30-40 mm.  However, his  pulmonary vascular resistance was minimally elevated.  He has had  preoperative PFTs that showed no obstruction, but there was definite  restriction secondary to his obesity and also a normal DLCO.  He was also  mildly hypercarbic with a pCO2 of 44 on admission with an elevated serum  bicarbonate which indicates chronic CO2 retention at baseline.  The patient  states that he is a loud snorer, but no one has ever commented on pauses in  his breathing during sleep.  His family is unavailable to verify this.  He  does have poor sleep efficiency and does have sleep pressure during the day  whenever he is not very active on his job.   PAST MEDICAL HISTORY:  Significant for:  1. Coronary artery disease.  2. History of hypertension.  3. History of diabetes.  4. History of dyslipidemia.   ALLERGIES:  The patient has no known drug allergies.   SOCIAL HISTORY:  He is married and has two children.  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  He does not smoke.   FAMILY HISTORY:  Unable to be obtained from the patient secondary to  sedative medications.  It was very difficult to get a history as well.   PHYSICAL EXAMINATION:  GENERAL APPEARANCE:  He is a morbidly obese white  male who weighed 325 pounds preoperatively.  VITAL SIGNS:  Blood pressure currently 130/80, pulse 94, respiratory rate  14, afebrile, O2 saturation on 4 L 100%.  HEENT:  Pupils equal, round, and reactive to light and accomodation.  Extraocular muscles intact.  Nares patent without discharge.  The oropharynx  is very difficult to examine because it is hard to get him to open his mouth  without dosing off.  He obviously has a very large tongue and  a small  posterior pharyngeal space as much as I can review.  NECK:  Large and without obvious thyromegaly or lymphadenopathy.  Difficult  to assess JVD because of its size.  CHEST:  Decreased breath sounds throughout with poor depth of inspiratory  effort.  CARDIAC:  Regular rate and rhythm with a 2/6 systolic murmur.  ABDOMEN:  Soft and nontender with good bowel sounds.  GENITALIA:  Not done and not indicated.  RECTAL:  Not done and not indicated.  BREASTS:  Not done and not indicated.  EXTREMITIES:  The lower extremities show some trace edema.  Pulses are  intact distally.  There is no calf tenderness.  Compression stockings are in  place.  NEUROLOGIC:  He is somewhat sleepy from medication, but oriented and able to  answer questions appropriately, but will doze off to sleep quite commonly.  He does move all four extremities.   LABORATORY DATA:  The chest x-ray shows small depth of inspiration with  compressed lung.   IMPRESSION:  1. Mild obesity hypoventilation syndrome with hypercarbia, probably at     baseline as well.  I agree with BIPAP to help with this and this will     also help with his atelectasis and low lung volumes.  2. Probable obstructive sleep  apnea.  The patient has snoring, sleep     pressure during the day, and unrested sleep in the  mornings upon     arising.  He clearly will need an outpatient sleep study after he is     discharged from the hospital.   PLAN:  1. Push out of bed and incentive spirometry.  2. Agree with h.s. BIPAP and also p.r.n. with naps.  3. Schedule outpatient sleep study.  4. Keep O2 as low as possible to maintain saturations in the 92-94% range.  5. Avoid sedatives as much as possible.                                               Danton Sewer, M.D. [facility]    KC/MEDQ  D:  08/18/2002  T:  08/18/2002  Job:  [id number]   cc:   Dr. Silvio Pate

## 2010-08-10 NOTE — Op Note (Signed)
NAME:  [name], [name]                          ACCOUNT NO.:  [id number]   MEDICAL RECORD NO.:  [id number]                   PATIENT TYPE:  OIB   LOCATION:  2852                                 FACILITY:  [facility]   PHYSICIAN:  Judeth Cornfield. Scot Dock, M.D.        DATE OF BIRTH:  [birth year]   DATE OF PROCEDURE:  04/05/2003  DATE OF DISCHARGE:                                 OPERATIVE REPORT   PREOPERATIVE DIAGNOSIS:  Chronic renal failure.   POSTOPERATIVE DIAGNOSIS:  Chronic renal failure.   OPERATION PERFORMED:  Placement of new right upper arm loop arteriovenous  graft.   SURGEON:  Judeth Cornfield. Scot Dock, M.D.   ASSISTANT:  Nurse.   ANESTHESIA:  Local with sedation.   DESCRIPTION OF PROCEDURE:  The patient was taken to the operating room,  sedated by anesthesia.  The right upper extremity was prepped and draped in  the usual sterile fashion.  After the skin was infiltrated with 1%  lidocaine, a transverse incision was made at the antecubital level and here  the cephalic vein was sclerotic and therefore he could not have an upper arm  fistula.  The artery was dissected free beneath the fascia; however, it was  quite small and on further interrogation this appeared to be a branch of the  brachial artery, thus the patient likely had a high bifurcation of his  brachial artery and it was elected therefore to place an upper arm loop  graft.  In addition, the veins here were quite small.   A separate longitudinal incision was made beneath the axilla after the skin  was anesthetized.  Here the high brachial vein was dissected free and this  was a decent sized vein.  The adjacent artery was also a good size.  The 4  to 7 mm graft was then tunneled in a loop fashion in the upper arm with the  arterial aspect of the graft along the radial aspect of the upper arm and  the venous aspect of the graft along the ulnar aspect of the upper arm.  The  patient was then heparinized. The brachial  artery was clamped proximally and  distally just distal to a _________ branch.  A longitudinal arteriotomy was  made. A short segment of the 4 mm end of the graft was excised.  The graft  slightly spatulated and sewn end-to-side to the artery using continuous 6-0  Prolene suture.  The graft was then pulled to the appropriate length for  anastomosis to the high brachial vein.  The vein was ligated distally and  spatulated proximally.  The graft was cut to the appropriate length and  spatulated and sewn end-to-end to the vein using continuous 6-0 Prolene  suture.  At the completion there was an excellent thrill in the graft and a  palpable radial pulse.  Hemostasis was obtained in the wounds.  The wounds  were closed with a deep layer of 3-0 Vicryl  and the skin closed with 4-0  Vicryl.  Sterile dressing was applied.  The patient tolerated the procedure  well and was transferred to the recovery room in satisfactory condition.  All sponge and needle counts were correct.   A sterile dressing was applied, the patient tolerated the procedure well and  was transferred to the recovery room in satisfactory condition.  All needle  and sponge counts were correct.                                              Judeth Cornfield. Scot Dock, M.D.   CSD/MEDQ  D:  04/05/2003  T:  04/05/2003  Job:  [id number]

## 2010-08-10 NOTE — Discharge Summary (Signed)
NAME:  [name], [name]                          ACCOUNT NO.:  [id number]   MEDICAL RECORD NO.:  [id number]                   PATIENT TYPE:  INP   LOCATION:  5530                                 FACILITY:  [facility]   PHYSICIAN:  Alvin C. Florene Glen, M.D.               DATE OF BIRTH:  Jul 25, 1960   DATE OF ADMISSION:  02/03/2003  DATE OF DISCHARGE:  02/06/2003                                 DISCHARGE SUMMARY   DIAGNOSES ON THIS ADMISSION:  1. Congestive heart failure exacerbation.  2. Diabetes.  3. Coronary artery disease.  4. Hyperlipidemia.  5. Acute renal failure.  6. Chronic renal insufficiency.  7. Obesity.  8. Hypertension.  9. Anemia.  10.      Gastroesophageal reflux disease.  11.      Shortness of breath and cough.   MEDICATIONS ON DISCHARGE:  1. Lasix 160 mg p.o. b.i.d.  2. Potassium 15 mEq p.o. b.i.d.  3. Norvasc 10 mg p.o. daily.  4. Zaroxolyn 5 mg p.o. daily.  5. Lopressor 75 mg p.o. b.i.d.  6. Aspirin 81 mg p.o. daily.  7. Zocor 40 mg p.o. q.h.s.  8. Lantus 6 units subcutaneous daily.  9. Glipizide 10 mg p.o. daily.  10.      Hemocyte one tablet p.o. b.i.d.  11.      Protonix 40 mg p.o. daily.  12.      Pain management - continue oxycodone as needed.   HISTORY OF PRESENT ILLNESS:  1. [name] is a 50 year old male with a past medical history significant     for CAD status post CABG, four vessel disease, in May of 2004.  2. Hypertension.  3. Chronic renal insufficiency with a baseline creatinine ranging between 2-     3.  4. Diabetes type 2.  5. Hyperlipidemia.   He presented to his cardiologist's office one day prior to admission with  worsening pleuritic chest pain and shortness of breath.  He also noticed  increased edema of the lower extremities for two weeks.  At this time, the  patient also complained of orthopnea, cough with no sputum, and he also  reports having increased his Lasix - however, without improvement in his  symptoms.  He also reports  taking a lot of salt products through canned food  and soda.  Labs were done as an outpatient which showed an increase in his  creatinine to 5.1, and a decrease in his potassium also.   PHYSICAL EXAMINATION ON ADMISSION:  VITAL SIGNS:  Temperature 98.4, heart  rate 66, respirations 24.  He was satting 95% on room air, and decreased to  92% with ambulation.  Blood pressure was 181/109.  GENERAL:  He was in no acute distress.  He had negative JVD.  HEART:  Regular rate and rhythm.  LUNGS:  He had no crackles, no rhonchi.  He has minimal scattered wheezing  with decreased air movement,  and he has negative egophony.  ABDOMEN:  Benign.  He has 3+ edema up to his knees, and he has water  blisters on both lower extremities.  Dorsalis pulses were palpable  bilaterally.  NEUROLOGIC:  He was intact.   HOSPITAL COURSE BY PROBLEM LIST:  #1.  Congestive heart failure  exacerbation.  This is believed to have been caused secondary to dietary  noncompliance and worsening renal failure.  The patient was given Lasix in  combination with Zaroxolyn, and put on a sodium-restricted diet, and this  helped with urine output.  He was negative about five liters overall on this  admission.  His edema decreased.  For completeness, we went ahead and  checked cardiac enzymes, which were negative.  We went ahead and checked a  BMP which was in the 2000 range, which is consistent with the CHF  exacerbation.  The patient was continued on his other medications, beta  blocker, and such.  He had an echo done as an outpatient by his cardiologist  which did not show any significant change in his ejection fraction, and it  was opted not to do it in the hospital again.  He had a TSH which was within  normal limits also, and an EKG without any new changes.  As mentioned  previously, the patient greatly improved with the new Lasix and diet  restrictions.  #2.  Chronic renal insufficiency with what appears to be superimposed  acute  renal failure.  The patient's creatinine was stable at about 2-3 following  acute renal failure in the month of October from an ACE inhibitor.  His  creatinine continued to rise during the hospitalization; however, he was  putting good urine output, and his potassium continued to be decreased.  The  patient did not require dialysis; however, the patient did want to go home,  and he was discharged, although his creatinine continued to rise with close  follow up as an outpatient.  #3.  Shortness of breath and cough.  Given that the patient had a recent  history of travel, we could not exclude the possibility of a pulmonary  embolus; however, given his renal failure, we could not do a contrasted CT.  Therefore, we went ahead and proceeded with a VQ scan, which was negative or  low probability for a pulmonary embolus; however, it did show that there was  a ventilation defect which could be consistent with a mass or mucous plug.  For that reason, pulmonary was consulted, and it was felt that it could be  followed up as an outpatient.  He was also ruled out for tuberculosis with  AFB sputum smears which were negative x2.  He also had a PPD placed, which  was negative.  The patient had an albuterol M.D.I. which appeared to help in  improving his symptoms, and he was oxygenating well on room air by the time  of discharge.  Chest x-ray remained unchanged during hospitalization.  #4.  Anemia.  He is chronically anemic, and actually on this admission his  hemoglobin had improved from what it was in the past.  It was stable.  He  continued him on his iron supplementation.  #5.  Coronary artery disease.  He remained stable during this  hospitalization.  #6.  Hypertension.  He had poor control when he initially came to the  hospital, possibly secondary to a volume overload.  By the day of discharge, his blood pressure was much better; however, he did  continue to have high  blood pressure of  first readings in the mornings, and this later stabilized  throughout the day.  #7.  Diabetes.  The patient was very well controlled with his Lantus 6  units, glipizide, and diet.   On the day of discharge, the patient was afebrile, heart rate was 66.  His  blood pressure was 154/95.  His sodium was 139, potassium 2.8, chloride 97,  bicarb 23, BUN 64, creatinine 6.1, glucose 70, hemoglobin 12.8.  The patient  was given supplementation for his low potassium, and his potassium was  checked again at 1600, and it was within normal limits.   FOLLOW UP:  The patient was instructed to follow up the following day at  [facility], and was instructed also to come back for a PPD reading.      Thornell Mule, M.D.                      Darrold Span. Florene Glen, M.D.    LC/MEDQ  D:  02/16/2003  T:  02/16/2003  Job:  [id number]

## 2010-08-10 NOTE — Op Note (Signed)
[name], [name]                            ACCOUNT NO.:  [id number]   MEDICAL RECORD NO.:  [id number]                   PATIENT TYPE:  INP   LOCATION:  2314                                 FACILITY:  [facility]   PHYSICIAN:  [name]. [name], M.D.              DATE OF BIRTH:  [birth year]   DATE OF PROCEDURE:  08/13/2002  DATE OF DISCHARGE:                                 OPERATIVE REPORT   PREOPERATIVE DIAGNOSIS:  Severe three-vessel coronary artery disease.   POSTOPERATIVE DIAGNOSIS:  Severe three-vessel coronary artery disease.   PROCEDURE:  Median sternotomy for coronary artery bypass grafting x4 (left  internal mammary artery to distal left anterior descending coronary artery,  left radial artery to circumflex marginal branch, saphenous vein graft to  first diagonal branch, saphenous vein graft to distal right coronary  artery), with endoscopic vein harvest.   SURGEON:  [name]. [name], M.D.   ASSISTANTS:  Sheliah [name], P.A., and [name], R.N.F.A.   ANESTHESIA:  General.   BRIEF CLINICAL NOTE:  The patient is a 50 year old obese Hispanic male  followed by Dr. Silvio Pate and referred by Dr. Albertine Patricia for management of coronary  artery disease.  The patient presents with exertional fatigue and a strongly  abnormal stress Cardiolite exam.  Cardiac catheterization performed by Dr.  Albertine Patricia demonstrates severe three-vessel coronary artery disease with normal  left ventricular function.  A full consultation note has been dictated  previously.   DESCRIPTION OF PROCEDURE:  Following full informed consent, the patient is  brought to the operating room on the above-mentioned date and invasive  hemodynamic monitoring is established by the anesthesia service under the  care and direction of [name]. [name], M.D.  Specifically, a Swan-Ganz  catheter is placed through the left internal jugular approach.  Attempts  were initially made at  placement through the right internal jugular  approach, and this was unsuccessful.  A right radial arterial line is  placed.  Intravenous antibiotics are administered.  Following induction with  general endotracheal anesthesia, a Foley catheter is placed.   The patient's left hand is carefully examined and a pulse oximetry probe  placed on his left index finger.  The pulse oximetry wave form is  continuously monitored while briefly occluding the radial pulse on that  side.  There is no significant change in the pulse oximetry wave form,  confirming the presence of intact palmar arch circulation.   The patient's chest, left upper extremity, abdomen, both groins, and both  lower extremities are prepared and draped in a sterile manner.  A median  sternotomy incision is performed and the left internal mammary artery is  dissected from the chest wall and prepared for bypass grafting.  The left  internal mammary artery is good-quality conduit.  Simultaneously saphenous  vein is obtained from the patient's right thigh using endoscopic vein  harvest technique.  The saphenous vein is  somewhat large-caliber but  otherwise good quality. Following this the left radial artery is harvested  from the left forearm through a longitudinal incision on the volar aspect of  the forearm.  Sharp dissection is utilized with the ultrasonic scalpel in  order to divide all intervening side branches of the artery and its  associated veins.  Care is taken to avoid the superficial thenar branch of  the left radial nerve.  The radial artery is mobilized from just above the  wrist to just after the takeoff of the large first perforating interosseous  branch below the antecubital fossa.  The patient is heparinized  systemically.  The radial artery is transected just above the wrist and the  distal stump is doubly oversewn with suture ligatures.  The proximal portion  of the conduit is then inspected for hemostasis and then the proximal end is  transected and the  stump doubly oversewn with suture ligatures.  The  arterial conduit is placed in a small container with the patient's  heparinized blood for storage.  The forearm incision is irrigated with  saline solution and then closed using a standard two-layer closure with  running absorbable suture.   The pericardium is opened.  The patient has an extremely large heart with  moderate left ventricular hypertrophy and global enlargement of the entire  heart.  This in combination with the patient's body habitus results in a  very foreshortened ascending thoracic aorta.  The ascending aorta is  cannulated for cardiopulmonary bypass.  A two-stage venous cannula is placed  through the right atrium.  Adequate heparinization is verified.   Cardiopulmonary bypass is begun and the surface of the heart is inspected.  Distal sites are selected for coronary bypass grafting.  There is moderate  left ventricular hypertrophy and severe enlargement of the entire heart.  Portions of saphenous vein, the left radial artery, and the left internal  mammary artery are all trimmed to appropriate length.  A temperature probe  is placed in the left ventricular septum and a cardioplegia catheter is  placed in the ascending aorta.   The patient is allowed to cool to 32 degrees systemic temperature.  The  aortic crossclamp is applied and cardioplegia is delivered in an antegrade  fashion through the aortic root.  Iced saline slush is applied for topical  hypothermia.  The initial cardioplegic arrest and myocardial cooling are  felt to be excellent. Repeat doses of cardioplegia are administered  intermittently throughout the crossclamp portion of the operation both  through the aortic root and down the subsequently-placed vein grafts to  maintain septal temperature below 15 degrees Centigrade.  The patient requires large volume of cardioplegia to keep his myocardial temperature  down during the procedure.  The following  distal coronary anastomoses are  performed:  (1) The distal right coronary artery is grafted with a saphenous  vein graft in an end-to-side fashion.  This coronary measures 3.0 mm in  diameter at the site of distal bypass and is of good quality.  (2) The first  diagonal branch off the left anterior descending coronary artery is grafted  with a saphenous vein graft in an end-to-side fashion.  This coronary  measures 2.2 mm in diameter and is of good quality at the site of distal  bypass.  (3) The circumflex marginal branch, which is really a  posterolateral branch off the distal left circumflex coronary artery, is  grafted with the left radial artery in an end-to-side fashion.  This  coronary measures 2.0 mm in diameter at the site of distal bypass and is of  good quality.  (4) The left anterior descending is grafted with the left  internal mammary artery in an end-to-side fashion.  This coronary measures  2.0 mm in diameter at the site of distal bypass and is of good quality.  Both proximal saphenous vein anastomoses are performed directly to the  ascending aorta prior to removal of the aortic crossclamp.  Although  initially felt to be long enough, the left radial artery will not reach all  the way to the ascending aorta due to the patient's massive cardiomegaly.  The aortic crossclamp is then removed after a total crossclamp time of 76  minutes.  The septal temperature is noted to rise rapidly and dramatically  upon reperfusion of the left internal mammary artery.   The heart begins to beat spontaneously.  A normal sinus rhythm resumes.  On  further inspection of the proximal end of the left radial conduit to the  distal left circumflex marginal branch, because of the discrepancy in length  a short interposition piece of saphenous vein was felt to be the optimal  solution.  An additional segment of saphenous vein is obtained from the  patient's right lower extremity beginning at the  incision from the previous  endoscopic vein harvest.  This vein is removed using sharp dissection and  then sewn in an end-to-end fashion to the proximal end of the radial artery  graft.  The proximal end of the saphenous vein interposition segment is now  sewn directly to the ascending aorta under a partial occlusion clamp.  All  proximal and distal anastomoses are inspected for hemostasis and appropriate  graft orientation.  Epicardial pacing wires are affixed to the right  ventricular outflow tract and to the right atrial appendage.  The patient is  weaned from cardiopulmonary bypass without difficulty.  The patient's rhythm  at separation from bypass is normal sinus rhythm.  The patient is weaned  from bypass on dopamine at 3 mcg/kg per minute.  The patient was known to  have renal insufficiency preoperatively and following induction with general anesthesia the patient's urine output decreased substantially, prompting  institution of renal-dose dopamine continuous infusion.  Total  cardiopulmonary bypass time for the operation is 143 minutes.   The venous and arterial cannulae are removed uneventfully.  Protamine is  administered to reverse the anticoagulation.  The mediastinum and the left  chest are irrigated with saline solution containing vancomycin.  Meticulous  surgical hemostasis is ascertained.  The mediastinum and the left chest are  drained with three chest tubes placed through separate stab incisions  inferiorly.  The median sternotomy is closed in a routine fashion, although  double-stranded wires are utilized to close the sternum.  The right lower  extremity incisions are closed in multiple layers in routine fashion.  All  skin incisions are closed with subcuticular skin closures.   The patient tolerates the procedure well and is transported to the surgical  intensive care unit in stable condition.  There are no intraoperative  complications.  All sponge, instrument, and  needle counts are verified  correct at completion of the operation.  No blood products were  administered.  [name]. [name], M.D.    CHO/MEDQ  D:  08/13/2002  T:  08/14/2002  Job:  [id number]   cc:   Ethelle Lyon, M.D.   [name], M.D.

## 2010-09-13 DIAGNOSIS — Z940 Kidney transplant status: Secondary | ICD-10-CM

## 2010-09-13 HISTORY — DX: Kidney transplant status: Z94.0

## 2010-09-13 HISTORY — PX: KIDNEY TRANSPLANT: SHX239

## 2010-10-02 ENCOUNTER — Ambulatory Visit (INDEPENDENT_AMBULATORY_CARE_PROVIDER_SITE_OTHER): Payer: PRIVATE HEALTH INSURANCE | Admitting: Internal Medicine

## 2010-10-02 ENCOUNTER — Encounter: Payer: Self-pay | Admitting: Internal Medicine

## 2010-10-02 VITALS — BP 152/80 | HR 67 | Temp 98.5°F | Ht 70.0 in | Wt 289.0 lb

## 2010-10-02 DIAGNOSIS — E119 Type 2 diabetes mellitus without complications: Secondary | ICD-10-CM

## 2010-10-02 DIAGNOSIS — N186 End stage renal disease: Secondary | ICD-10-CM | POA: Insufficient documentation

## 2010-10-02 DIAGNOSIS — E785 Hyperlipidemia, unspecified: Secondary | ICD-10-CM | POA: Insufficient documentation

## 2010-10-02 DIAGNOSIS — I10 Essential (primary) hypertension: Secondary | ICD-10-CM | POA: Insufficient documentation

## 2010-10-02 DIAGNOSIS — I2510 Atherosclerotic heart disease of native coronary artery without angina pectoris: Secondary | ICD-10-CM

## 2010-10-02 DIAGNOSIS — E1169 Type 2 diabetes mellitus with other specified complication: Secondary | ICD-10-CM | POA: Insufficient documentation

## 2010-10-02 NOTE — Patient Instructions (Signed)
Please call if your fasting blood sugars are consistently over 180 or if you have low sugar reactions.

## 2010-10-02 NOTE — Assessment & Plan Note (Signed)
BP: 152/80 mmHg   Not bad  No changes unless transplant team recommends

## 2010-10-02 NOTE — Assessment & Plan Note (Signed)
Control has been poor since on prednisone lantus is right med now May have to adjust dosing

## 2010-10-02 NOTE — Progress Notes (Signed)
Subjective:    Patient ID: [name], male    DOB: 1961/03/13, 50 y.o.   MRN: [id number]  HPI Reestablishing care with me Here with wife  Long standing DM from 1996 Glipizide in past but now added lantus Sugars have been higher on the prednisone post transplant so now on lantus Checks sugars bid for now  Renal failure and started dialysis 2005 Happened after CABG Cadaver transplant last month Now being followed at [facility] where surgery done So far everything looks good Hopefully can wean off the prednisone if biopsy shows no rejection  CAD first diagnosed ~2005 Had CABG soon after that No problems since that time  Current outpatient prescriptions:amLODipine (NORVASC) 10 MG tablet, Take 10 mg by mouth daily.  , Disp: , Rfl: ;  ciprofloxacin (CIPRO) 500 MG tablet, Take 500 mg by mouth daily. for 7 days , Disp: , Rfl: ;  docusate sodium (COLACE) 100 MG capsule, Take 100 mg by mouth 2 (two) times daily.  , Disp: , Rfl: ;  fluconazole (DIFLUCAN) 50 MG tablet, Take 50 mg by mouth daily.  , Disp: , Rfl:  furosemide (LASIX) 80 MG tablet, Take 80 mg by mouth daily.  , Disp: , Rfl: ;  glipiZIDE (GLUCOTROL) 10 MG tablet, Take 10 mg by mouth 2 (two) times daily before a meal.  , Disp: , Rfl: ;  HYDROcodone-acetaminophen (NORCO) 5-325 MG per tablet, Take 1-2 tablets by mouth every 6 (six) hours as needed.  , Disp: , Rfl: ;  insulin glargine (LANTUS) 100 UNIT/ML injection, Inject 20 Units into the skin at bedtime.  , Disp: , Rfl:  labetalol (NORMODYNE) 200 MG tablet, Take 200 mg by mouth 2 (two) times daily.  , Disp: , Rfl: ;  lactulose (CHRONULAC) 10 GM/15ML solution, 1 tablespoon daily for 7 days , Disp: , Rfl: ;  metoprolol tartrate (LOPRESSOR) 25 MG tablet, Take 25 mg by mouth 2 (two) times daily.  , Disp: , Rfl: ;  mometasone (NASONEX) 50 MCG/ACT nasal spray, Place 2 sprays into the nose as needed.  , Disp: , Rfl:  mycophenolate (MYFORTIC) 180 MG EC tablet, Take 720 mg by mouth 2 (two) times  daily.  , Disp: , Rfl: ;  omeprazole (PRILOSEC) 20 MG capsule, Take 20 mg by mouth daily.  , Disp: , Rfl: ;  polyethylene glycol (MIRALAX / GLYCOLAX) packet, Take 17 g by mouth daily.  , Disp: , Rfl: ;  potassium chloride (KLOR-CON) 20 MEQ packet, Take 20 mEq by mouth daily. for 7 days , Disp: , Rfl:  predniSONE (DELTASONE) 5 MG tablet, Take 10 mg by mouth as directed.  , Disp: , Rfl: ;  sulfamethoxazole-trimethoprim (BACTRIM,SEPTRA) 400-80 MG per tablet, Take 1 tablet by mouth. on Monday, Wednesday & Friday  , Disp: , Rfl: ;  tacrolimus (PROGRAF) 1 MG capsule, Take 2 capsule in the morning and 1 capsule in the evening , Disp: , Rfl: ;  traZODone (DESYREL) 50 MG tablet, Take 50 mg by mouth at bedtime.  , Disp: , Rfl:  valGANciclovir (VALCYTE) 450 MG tablet, Take 450 mg by mouth. on Monday, Wednesday & Friday  , Disp: , Rfl:   No current outpatient prescriptions on file prior to visit.    No Known Allergies  Past Medical History  Diagnosis Date  . Diabetes mellitus   . CAD (coronary artery disease) 2005    had CABG  . Hypertension   . Hyperlipidemia     low since renal failure  .  ESRD (end stage renal disease) 2005    Dialysis then renal transplant    Past Surgical History  Procedure Date  . Kidney transplant 6/12    cadaver--at [facility]  . Coronary artery bypass graft 2005  . Tympanic membrane repair 1/12    left    Family History  Problem Relation Age of Onset  . Heart disease Father   . Diabetes Maternal Grandmother     History   Social History  . Marital Status: Married    Spouse Name: N/A    Number of Children: 2  . Years of Education: N/A   Occupational History  . 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 Smoking status: Never Smoker   . Smokeless tobacco: Never Used  . Alcohol Use: No  . Drug Use: No  . Sexually Active: Not on file   Other Topics Concern  . Not on file   Social History Narrative   Has  living willWife is health care POAWould accept resuscitation attemptsHasn't considered tube feedings   Review of Systems  Constitutional: Positive for unexpected weight change.       Weight up a bit from his dry weight--lasix just restarted Wears seat belt  HENT: Negative for hearing loss, dental problem and tinnitus.   Eyes: Negative for visual disturbance.  Respiratory: Negative for cough, chest tightness and shortness of breath.   Cardiovascular: Positive for leg swelling. Negative for chest pain and palpitations.  Gastrointestinal: Positive for constipation. Negative for abdominal pain.  Genitourinary: Positive for dysuria and frequency.       On Rx for UTI now  Musculoskeletal: Negative for back pain, joint swelling and arthralgias.  Neurological: Negative for dizziness and syncope.  Psychiatric/Behavioral: Positive for suicidal ideas. Negative for dysphoric mood. The patient is not nervous/anxious.        Had crying post op but no persistent symptoms Sleep disturbed by nocturia       Objective:   Physical Exam  Constitutional: He appears well-developed and well-nourished. No distress.  Neck: Normal range of motion. Neck supple. No thyromegaly present.  Cardiovascular: Normal rate, regular rhythm, normal heart sounds and intact distal pulses.  Exam reveals no gallop.   No murmur heard. Pulmonary/Chest: Effort normal and breath sounds normal. No respiratory distress. He has no wheezes. He has no rales.  Abdominal: Soft.       Slightly tender over staples in RLQ surgery site---staples still in with mild redness  Musculoskeletal: Normal range of motion. He exhibits no edema.  Lymphadenopathy:    He has no cervical adenopathy.  Neurological:       Normal sensation in feet  Psychiatric: He has a normal mood and affect. His behavior is normal. Judgment and thought content normal.          Assessment & Plan:

## 2010-10-02 NOTE — Assessment & Plan Note (Signed)
Has been low since ESRD Will check with labs next time

## 2010-10-02 NOTE — Assessment & Plan Note (Signed)
Just got transplant---wonderful On immunosuppressive regimen

## 2010-10-02 NOTE — Assessment & Plan Note (Addendum)
Has been quiet since CABG No specific rx Is on beta blocker though

## 2010-12-04 ENCOUNTER — Ambulatory Visit: Payer: Medicare Other | Admitting: Internal Medicine

## 2010-12-17 DIAGNOSIS — Z79899 Other long term (current) drug therapy: Secondary | ICD-10-CM | POA: Insufficient documentation

## 2010-12-17 DIAGNOSIS — Z940 Kidney transplant status: Secondary | ICD-10-CM | POA: Insufficient documentation

## 2010-12-17 DIAGNOSIS — I10 Essential (primary) hypertension: Secondary | ICD-10-CM

## 2010-12-17 DIAGNOSIS — I2510 Atherosclerotic heart disease of native coronary artery without angina pectoris: Secondary | ICD-10-CM | POA: Insufficient documentation

## 2010-12-17 DIAGNOSIS — Z6841 Body Mass Index (BMI) 40.0 and over, adult: Secondary | ICD-10-CM | POA: Insufficient documentation

## 2010-12-17 DIAGNOSIS — E876 Hypokalemia: Secondary | ICD-10-CM | POA: Insufficient documentation

## 2010-12-17 HISTORY — DX: Essential (primary) hypertension: I10

## 2010-12-25 DIAGNOSIS — Z79899 Other long term (current) drug therapy: Secondary | ICD-10-CM | POA: Insufficient documentation

## 2011-02-26 DIAGNOSIS — R609 Edema, unspecified: Secondary | ICD-10-CM | POA: Insufficient documentation

## 2011-03-01 ENCOUNTER — Telehealth: Payer: Self-pay | Admitting: Internal Medicine

## 2011-03-01 DIAGNOSIS — E1129 Type 2 diabetes mellitus with other diabetic kidney complication: Secondary | ICD-10-CM

## 2011-03-01 DIAGNOSIS — E1165 Type 2 diabetes mellitus with hyperglycemia: Secondary | ICD-10-CM

## 2011-03-01 NOTE — Telephone Encounter (Signed)
Patient called and stated he would like a referral to lifestyle so they can help him with his diabetic medication.

## 2011-03-18 ENCOUNTER — Encounter: Payer: Self-pay | Admitting: Internal Medicine

## 2011-03-18 ENCOUNTER — Ambulatory Visit (INDEPENDENT_AMBULATORY_CARE_PROVIDER_SITE_OTHER): Payer: Medicare Other | Admitting: Internal Medicine

## 2011-03-18 DIAGNOSIS — E1165 Type 2 diabetes mellitus with hyperglycemia: Secondary | ICD-10-CM

## 2011-03-18 DIAGNOSIS — J069 Acute upper respiratory infection, unspecified: Secondary | ICD-10-CM | POA: Insufficient documentation

## 2011-03-18 DIAGNOSIS — E1129 Type 2 diabetes mellitus with other diabetic kidney complication: Secondary | ICD-10-CM

## 2011-03-18 MED ORDER — BENZONATATE 200 MG PO CAPS: 200.0000 mg | ORAL_CAPSULE | Freq: Three times a day (TID) | ORAL | Status: DC | PRN

## 2011-03-18 NOTE — Assessment & Plan Note (Signed)
Seems to have self limited viral infection Had benefited from benzonatate for cough---will refill (helps at night)

## 2011-03-18 NOTE — Patient Instructions (Signed)
Please get a copy of your last labs for me Please rotate the insulin sites and go up to 50 units now. If you inject in a new site, and are still running over 200, add 5 more units every 3 days till the sugars are under 180 regularly

## 2011-03-18 NOTE — Progress Notes (Signed)
Subjective:    Patient ID: [name], male    DOB: 11-16-1960, 50 y.o.   MRN: [id number]  HPI Seen for "bug" at his kidney center CXR okay, swab negative for influenza No meds for this Still some cough but seems to be easing up  Sugars have been very high since sick Running in the high 200's and even over 300 Had cut the lantus down to 30 due to low sugar reactions---over a month ago Had been 90-130 till he got sick  Checks BP daily also Nephrologists still adjusting BP meds Did have trouble with orthostasis so they are being careful  Did increase the lantus to 60 units and the sugars don't seem to be down any Injects in RLQ every day  Current Outpatient Prescriptions on File Prior to Visit  Medication Sig Dispense Refill  . furosemide (LASIX) 80 MG tablet Take 160 mg by mouth 2 (two) times daily.       Marland Kitchen glipiZIDE (GLUCOTROL) 10 MG tablet Take 10 mg by mouth 2 (two) times daily before a meal.        . insulin glargine (LANTUS) 100 UNIT/ML injection Inject 35 Units into the skin at bedtime.       . mometasone (NASONEX) 50 MCG/ACT nasal spray Place 2 sprays into the nose as needed.        . mycophenolate (MYFORTIC) 180 MG EC tablet Take 720 mg by mouth 2 (two) times daily.        Marland Kitchen omeprazole (PRILOSEC) 20 MG capsule Take 20 mg by mouth daily.        . potassium chloride (KLOR-CON) 20 MEQ packet Take 60 mEq by mouth 3 (three) times daily.       . tacrolimus (PROGRAF) 1 MG capsule Take 2 capsule in the morning and 1 capsule in the evening       . valGANciclovir (VALCYTE) 450 MG tablet Take 450 mg by mouth. on Monday, Wednesday & Friday          No Known Allergies  Past Medical History  Diagnosis Date  . Diabetes mellitus   . CAD (coronary artery disease) 2005    had CABG  . Hypertension   . Hyperlipidemia     low since renal failure  . ESRD (end stage renal disease) 2005    Dialysis then renal transplant    Past Surgical History  Procedure Date  . Kidney  transplant 6/12    cadaver--at [facility]  . Coronary artery bypass graft 2005  . Tympanic membrane repair 1/12    left    Family History  Problem Relation Age of Onset  . Heart disease Father   . Diabetes Maternal Grandmother     History   Social History  . Marital Status: Married    Spouse Name: N/A    Number of Children: 2  . Years of Education: N/A   Occupational History  . 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 Smoking status: Never Smoker   . Smokeless tobacco: Never Used  . Alcohol Use: No  . Drug Use: No  . Sexually Active: Not on file   Other Topics Concern  . Not on file   Social History Narrative   Has living willWife is health care POAWould accept resuscitation attemptsHasn't considered tube feedings   Review of Systems Weight had gone up 30# but has lost it mostly on furosemide Appetite is variable--tries to  eat well (other than occ holiday cheating)    Objective:   Physical Exam  Constitutional: He appears well-developed and well-nourished. No distress.  Neck: Normal range of motion. Neck supple.  Cardiovascular: Normal rate, regular rhythm, normal heart sounds and intact distal pulses.  Exam reveals no gallop.   No murmur heard. Pulmonary/Chest: Effort normal and breath sounds normal. No respiratory distress. He has no wheezes. He has no rales.  Musculoskeletal: He exhibits no edema and no tenderness.       Thick calves but no pitting  Lymphadenopathy:    He has no cervical adenopathy.  Skin:       Sclerotic area in RLQ  Psychiatric: He has a normal mood and affect. His behavior is normal. Judgment and thought content normal.          Assessment & Plan:

## 2011-03-18 NOTE — Assessment & Plan Note (Signed)
He just had his A1c tested at [facility] and he thinks it was 6.1% He will get those records for me I am concerned he is not absorbing the lantus well due to not rotating sites---will try other side of his abdomen Increase to 50 units for now

## 2011-03-20 ENCOUNTER — Ambulatory Visit: Payer: Medicare Other | Admitting: Internal Medicine

## 2011-03-26 ENCOUNTER — Ambulatory Visit: Payer: Self-pay | Admitting: Internal Medicine

## 2011-04-08 ENCOUNTER — Telehealth: Payer: Self-pay | Admitting: Internal Medicine

## 2011-04-08 NOTE — Telephone Encounter (Signed)
Patient called and stated he increased in Lantus to 50 units daily and his BS fasting is still running between 250-350.  Wanted to know your advice on what to do.

## 2011-04-08 NOTE — Telephone Encounter (Signed)
Okay to increase to 60 units and then increase by 3 more units every 3 days till sugars are under 200. Did he get the copy of the HgbA1c from [facility]? He has said he thought it was 6.1% and I was a little afraid to increase his insulin too much if he was that low

## 2011-04-09 NOTE — Telephone Encounter (Signed)
Spoke with patient and advised results, he will get a copy of labs from [facility] and fax to [name].

## 2011-04-19 ENCOUNTER — Ambulatory Visit (INDEPENDENT_AMBULATORY_CARE_PROVIDER_SITE_OTHER): Payer: Medicare Other | Admitting: Internal Medicine

## 2011-04-19 ENCOUNTER — Encounter: Payer: Self-pay | Admitting: Internal Medicine

## 2011-04-19 VITALS — BP 130/80 | HR 68 | Temp 98.3°F | Ht 70.0 in | Wt 297.0 lb

## 2011-04-19 DIAGNOSIS — E1165 Type 2 diabetes mellitus with hyperglycemia: Secondary | ICD-10-CM

## 2011-04-19 DIAGNOSIS — J019 Acute sinusitis, unspecified: Secondary | ICD-10-CM

## 2011-04-19 DIAGNOSIS — E1129 Type 2 diabetes mellitus with other diabetic kidney complication: Secondary | ICD-10-CM

## 2011-04-19 LAB — HEMOGLOBIN A1C: Hgb A1c MFr Bld: 11.4 % — ABNORMAL HIGH (ref 4.6–6.5)

## 2011-04-19 MED ORDER — INSULIN GLARGINE 100 UNIT/ML ~~LOC~~ SOLN: 63.0000 [IU] | Freq: Every day | SUBCUTANEOUS | Status: DC

## 2011-04-19 MED ORDER — AMOXICILLIN-POT CLAVULANATE 875-125 MG PO TABS: 1.0000 | ORAL_TABLET | Freq: Two times a day (BID) | ORAL | Status: AC

## 2011-04-19 MED ORDER — OMEPRAZOLE 20 MG PO CPDR: 20.0000 mg | DELAYED_RELEASE_CAPSULE | Freq: Every day | ORAL | Status: DC

## 2011-04-19 MED ORDER — INSULIN ASPART 100 UNIT/ML ~~LOC~~ SOLN: 5.0000 [IU] | Freq: Three times a day (TID) | SUBCUTANEOUS | Status: DC

## 2011-04-19 NOTE — Assessment & Plan Note (Signed)
Still running over 300 at times fasting Has rotated sites Up to 63 of lantus  P: will check A1c       Check sugars tid at least---use coverage with rapid acting for now     If A1c very high, or sugars not lower, will start regular tid ac rapid

## 2011-04-19 NOTE — Patient Instructions (Signed)
Please check your sugars before every meal. Take 5 units of the novolog rapid acting if over 200 and 10 units if it is over 300

## 2011-04-19 NOTE — Progress Notes (Signed)
Subjective:    Patient ID: [name], male    DOB: Jul 12, 1960, 51 y.o.   MRN: [id number]  HPI Diabetes is still way out of control Still with high peaks often over 300 Up to 63 units of lantus Rotating spots   Feels like he is sick Some fever last month but not recently Cough with yellow mucus CXR at [facility] last month No SOB  Current Outpatient Prescriptions on File Prior to Visit  Medication Sig Dispense Refill  . furosemide (LASIX) 80 MG tablet Take 160 mg by mouth 2 (two) times daily.       Marland Kitchen glipiZIDE (GLUCOTROL) 10 MG tablet Take 10 mg by mouth 2 (two) times daily before a meal.        . insulin glargine (LANTUS) 100 UNIT/ML injection Inject 50 Units into the skin at bedtime. Increase as directed if sugars stay over 180 in the morning      . labetalol (NORMODYNE) 300 MG tablet Take 150 mg by mouth 2 (two) times daily.        . metolazone (ZAROXOLYN) 2.5 MG tablet Take 2.5 mg by mouth daily.        . mometasone (NASONEX) 50 MCG/ACT nasal spray Place 2 sprays into the nose as needed.        . mycophenolate (MYFORTIC) 180 MG EC tablet Take 720 mg by mouth 2 (two) times daily.        . potassium chloride (KLOR-CON) 20 MEQ packet Take 60 mEq by mouth 3 (three) times daily.       . tacrolimus (PROGRAF) 1 MG capsule Take 1 mg by mouth 2 (two) times daily.         No Known Allergies  Past Medical History  Diagnosis Date  . Diabetes mellitus   . CAD (coronary artery disease) 2005    had CABG  . Hypertension   . Hyperlipidemia     low since renal failure  . ESRD (end stage renal disease) 2005    Dialysis then renal transplant    Past Surgical History  Procedure Date  . Kidney transplant 6/12    cadaver--at [facility]  . Coronary artery bypass graft 2005  . Tympanic membrane repair 1/12    left    Family History  Problem Relation Age of Onset  . Heart disease Father   . Diabetes Maternal Grandmother     History   Social History  . Marital Status: Married   Spouse Name: N/A    Number of Children: 2  . Years of Education: N/A   Occupational History  . 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 Smoking status: Never Smoker   . Smokeless tobacco: Never Used  . Alcohol Use: No  . Drug Use: No  . Sexually Active: Not on file   Other Topics Concern  . Not on file   Social History Narrative   Has living willWife is health care POAWould accept resuscitation attemptsHasn't considered tube feedings   Review of Systems Weight is stable BP is better Does hold fluid at times Kidney function is still good---latest creat 1.37    Objective:   Physical Exam  Constitutional: He appears well-developed and well-nourished. No distress.  HENT:  Mouth/Throat: Oropharynx is clear and moist. No oropharyngeal exudate.       No sinus tenderness Moderate nasal inflammation and some dark mucus  Neck: Normal range of motion. Neck supple.  Pulmonary/Chest: Effort normal and breath sounds normal. No respiratory distress. He has no wheezes. He has no rales.  Lymphadenopathy:    He has no cervical adenopathy.          Assessment & Plan:

## 2011-04-19 NOTE — Assessment & Plan Note (Signed)
Seems to have secondary bacterial infection in sinuses May be causing his sugars to be so high Will treat with augmentin

## 2011-04-22 ENCOUNTER — Other Ambulatory Visit: Payer: Self-pay | Admitting: Internal Medicine

## 2011-04-22 MED ORDER — INSULIN PEN NEEDLE 31G X 8 MM MISC: Status: DC

## 2011-04-22 NOTE — Telephone Encounter (Signed)
rx sent to pharmacy by e-script

## 2011-04-26 ENCOUNTER — Ambulatory Visit: Payer: Self-pay | Admitting: Internal Medicine

## 2011-05-10 ENCOUNTER — Telehealth: Payer: Self-pay | Admitting: Internal Medicine

## 2011-05-10 MED ORDER — GLUCOSE BLOOD VI STRP: ORAL_STRIP | Status: DC

## 2011-05-10 NOTE — Telephone Encounter (Signed)
Phone note

## 2011-05-14 ENCOUNTER — Other Ambulatory Visit: Payer: Self-pay | Admitting: *Deleted

## 2011-05-14 MED ORDER — INSULIN GLARGINE 100 UNIT/ML ~~LOC~~ SOLN: 70.0000 [IU] | Freq: Every day | SUBCUTANEOUS | Status: DC

## 2011-05-14 NOTE — Telephone Encounter (Signed)
Patient called stating that he needs a refill on his Lantus and wants to change over to the pen. Patient states that he is now using 70 units daily. Patient stated that the last time he got the Lantus the vial was sent in and the pen will be so much easier for him to use.  Pharmacy [facility] .

## 2011-05-14 NOTE — Telephone Encounter (Signed)
Go ahead and fill the pen for a year Make sure we send Rx for the needles needed with the pen

## 2011-05-14 NOTE — Telephone Encounter (Signed)
rx sent to pharmacy by e-script Left message for patient that rx was called in.

## 2011-05-24 ENCOUNTER — Ambulatory Visit: Payer: Self-pay | Admitting: Internal Medicine

## 2011-05-27 ENCOUNTER — Other Ambulatory Visit: Payer: Self-pay | Admitting: *Deleted

## 2011-05-27 MED ORDER — GLUCOSE BLOOD VI STRP: ORAL_STRIP | Status: DC

## 2011-05-27 NOTE — Telephone Encounter (Signed)
Received note from [facility] asking for frequency of testing and per pt he tests 3 times daily. rx sent to pharmacy by e-script

## 2011-07-08 ENCOUNTER — Other Ambulatory Visit: Payer: Self-pay

## 2011-07-08 MED ORDER — GLIPIZIDE 10 MG PO TABS: 10.0000 mg | ORAL_TABLET | Freq: Two times a day (BID) | ORAL | Status: DC

## 2011-07-08 NOTE — Telephone Encounter (Signed)
Pt request refill Glipizide 10 mg #60 x 11 sent to [facility]. Patient notified as instructed by telephone that rx sent.

## 2011-07-11 ENCOUNTER — Other Ambulatory Visit: Payer: Self-pay | Admitting: *Deleted

## 2011-07-11 NOTE — Telephone Encounter (Signed)
He is going to have to get this from his transplant team I really can't prescribe this for him

## 2011-07-11 NOTE — Telephone Encounter (Signed)
Spoke with patient and advised results   

## 2011-07-15 ENCOUNTER — Encounter: Payer: Self-pay | Admitting: Internal Medicine

## 2011-07-15 ENCOUNTER — Ambulatory Visit (INDEPENDENT_AMBULATORY_CARE_PROVIDER_SITE_OTHER): Payer: Medicare Other | Admitting: Internal Medicine

## 2011-07-15 VITALS — BP 142/80 | HR 65 | Temp 98.6°F | Wt 303.0 lb

## 2011-07-15 DIAGNOSIS — E1165 Type 2 diabetes mellitus with hyperglycemia: Secondary | ICD-10-CM

## 2011-07-15 DIAGNOSIS — N058 Unspecified nephritic syndrome with other morphologic changes: Secondary | ICD-10-CM

## 2011-07-15 DIAGNOSIS — E1129 Type 2 diabetes mellitus with other diabetic kidney complication: Secondary | ICD-10-CM

## 2011-07-15 DIAGNOSIS — J019 Acute sinusitis, unspecified: Secondary | ICD-10-CM

## 2011-07-15 MED ORDER — AMOXICILLIN-POT CLAVULANATE 875-125 MG PO TABS: 1.0000 | ORAL_TABLET | Freq: Two times a day (BID) | ORAL | Status: AC

## 2011-07-15 NOTE — Assessment & Plan Note (Signed)
Likely viral but he is immune compromised Will go ahead with augmentin--just in case

## 2011-07-15 NOTE — Patient Instructions (Addendum)
Please increase the lantus to 75 units daily. If your fasting sugars are still over 160 in 2 weeks, please increase to 80 units. Please check your sugars at least twice a day. Vary the times ----fasting, 1-2 hours after meals and before dinner-----and keep log. Make sure you bring it to your next visit

## 2011-07-15 NOTE — Assessment & Plan Note (Signed)
May be some better with at least bid novolog Due to high fasting sugars, will increase lantus Discussed keeping a log and we will review at next visit

## 2011-07-15 NOTE — Progress Notes (Signed)
Subjective:    Patient ID: [name], male    DOB: [birth year], 51 y.o.   MRN: [id number]  HPI Did get better after the last visit Wife got sick---needed z-pak He got her illness ~3 days ago Worsened yesterday---usually worse in PM but sig yesterday Had to spend day in bed for fatigue Felt cold and hot Pain in back of eyes Has sinus congestion and drainage No clear cut fever  Slight cough--worsening today No SOB No postnasal drip today---noted it yesterday No help with hot steam No sore throat  Some left ear pain  Tried OTC alka seltzer cold/flu--may have helped somewhat  Using the rapid acting insulin before some of the time Forgets lunch Fasting sugars 190-210  Current Outpatient Prescriptions on File Prior to Visit  Medication Sig Dispense Refill  . furosemide (LASIX) 80 MG tablet Take 160 mg by mouth 2 (two) times daily.       Marland Kitchen glipiZIDE (GLUCOTROL) 10 MG tablet Take 1 tablet (10 mg total) by mouth 2 (two) times daily before a meal.  60 tablet  11  . glucose blood test strip Test blood sugar 3 times per day.  Dx: 250.00  100 each  11  . insulin aspart (NOVOLOG FLEXPEN) 100 UNIT/ML injection Inject 5 Units into the skin 3 (three) times daily before meals.  10 cartridge  12  . insulin glargine (LANTUS SOLOSTAR) 100 UNIT/ML injection Inject 70 Units into the skin at bedtime.  30 mL  11  . Insulin Pen Needle 31G X 8 MM MISC Inject as directed 4 times daily as needed  400 each  3  . labetalol (NORMODYNE) 300 MG tablet Take 150 mg by mouth 2 (two) times daily.        . metolazone (ZAROXOLYN) 2.5 MG tablet Take 2.5 mg by mouth daily.        . mometasone (NASONEX) 50 MCG/ACT nasal spray Place 2 sprays into the nose as needed.        . mycophenolate (MYFORTIC) 180 MG EC tablet Take 720 mg by mouth 2 (two) times daily.        Marland Kitchen omeprazole (PRILOSEC) 20 MG capsule Take 1 capsule (20 mg total) by mouth daily.  30 capsule  11  . potassium chloride (KLOR-CON) 20 MEQ packet Take 60  mEq by mouth 3 (three) times daily.       . tacrolimus (PROGRAF) 1 MG capsule Take 1 mg by mouth 2 (two) times daily.         No Known Allergies  Past Medical History  Diagnosis Date  . Diabetes mellitus   . CAD (coronary artery disease) 2005    had CABG  . Hypertension   . Hyperlipidemia     low since renal failure  . ESRD (end stage renal disease) 2005    Dialysis then renal transplant    Past Surgical History  Procedure Date  . Kidney transplant 6/12    cadaver--at [facility]  . Coronary artery bypass graft 2005  . Tympanic membrane repair 1/12    left    Family History  Problem Relation Age of Onset  . Heart disease Father   . Diabetes Maternal Grandmother     History   Social History  . Marital Status: Married    Spouse Name: N/A    Number of Children: 2  . Years of Education: N/A   Occupational History  . 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 Smoking status: Never Smoker   . Smokeless tobacco: Never Used  . Alcohol Use: No  . Drug Use: No  . Sexually Active: Not on file   Other Topics Concern  . Not on file   Social History Narrative   Has living willWife is health care POAWould accept resuscitation attemptsHasn't considered tube feedings   Review of Systems No rash  No vomiting  Bowels are loose now but not diarrhea    Objective:   Physical Exam  Constitutional: He appears well-developed and well-nourished. No distress.  HENT:  Mouth/Throat: Oropharynx is clear and moist. No oropharyngeal exudate.       Mild frontal tenderness Mild nasal congestion TMs normal  Neck: Normal range of motion. Neck supple.  Pulmonary/Chest: Effort normal and breath sounds normal. No respiratory distress. He has no wheezes. He has no rales.  Lymphadenopathy:    He has no cervical adenopathy.          Assessment & Plan:

## 2011-09-25 ENCOUNTER — Telehealth: Payer: Self-pay

## 2011-09-25 DIAGNOSIS — M6530 Trigger finger, unspecified finger: Secondary | ICD-10-CM

## 2011-09-25 NOTE — Telephone Encounter (Signed)
For 1 month rt little finger feels tight and pulling back toward palm; now at 45 % angle; cannot straighten little finger. Rt ring finger starting to pull back also. Pt has appt 10/04/11 to see Dr Silvio Pate but wants to know if can get referral for hand specialist. Pt presently taking Lantus 110 units at bedtime and Novolog 10-15 units prior to lunch and dinner depending on BS. Today FBS 175 and at supper average BS 115-140.Please advise.

## 2011-09-25 NOTE — Telephone Encounter (Signed)
Referral made

## 2011-10-03 ENCOUNTER — Telehealth: Payer: Self-pay | Admitting: Internal Medicine

## 2011-10-03 NOTE — Telephone Encounter (Signed)
Dr. Levell July office was calling just to let you know that Mr. Santana cancelled his appointment for the referral.

## 2011-10-03 NOTE — Telephone Encounter (Signed)
Maybe he got better Can review at his next visit

## 2011-10-04 ENCOUNTER — Ambulatory Visit: Payer: Medicare Other | Admitting: Internal Medicine

## 2011-10-11 ENCOUNTER — Other Ambulatory Visit: Payer: Self-pay

## 2011-10-11 NOTE — Telephone Encounter (Signed)
Pt left v/m request refill of contour glucose test strips. Was not clear if pt needed glucometer also. Left v/m for pt to call back. [facility] outpt pharmacy

## 2011-10-15 MED ORDER — GLUCOSE BLOOD VI STRP: ORAL_STRIP | Status: DC

## 2011-10-15 NOTE — Telephone Encounter (Signed)
Pt does not need meter. Bayer contour test strips # 100 x 3 sent to [facility] pt notified done while on phone.

## 2011-11-01 LAB — BASIC METABOLIC PANEL: Creatinine: 1.1 mg/dL (ref <=1.3)

## 2011-11-12 ENCOUNTER — Ambulatory Visit (INDEPENDENT_AMBULATORY_CARE_PROVIDER_SITE_OTHER): Payer: PRIVATE HEALTH INSURANCE | Admitting: Internal Medicine

## 2011-11-12 ENCOUNTER — Encounter: Payer: Self-pay | Admitting: Internal Medicine

## 2011-11-12 VITALS — BP 142/80 | HR 84 | Temp 97.8°F | Ht 70.0 in | Wt 304.0 lb

## 2011-11-12 DIAGNOSIS — F4320 Adjustment disorder, unspecified: Secondary | ICD-10-CM

## 2011-11-12 DIAGNOSIS — Z1211 Encounter for screening for malignant neoplasm of colon: Secondary | ICD-10-CM

## 2011-11-12 DIAGNOSIS — Z0000 Encounter for general adult medical examination without abnormal findings: Secondary | ICD-10-CM

## 2011-11-12 DIAGNOSIS — Z23 Encounter for immunization: Secondary | ICD-10-CM

## 2011-11-12 DIAGNOSIS — N186 End stage renal disease: Secondary | ICD-10-CM

## 2011-11-12 DIAGNOSIS — E119 Type 2 diabetes mellitus without complications: Secondary | ICD-10-CM

## 2011-11-12 DIAGNOSIS — F4321 Adjustment disorder with depressed mood: Secondary | ICD-10-CM | POA: Insufficient documentation

## 2011-11-12 MED ORDER — TRAZODONE HCL 50 MG PO TABS: 25.0000 mg | ORAL_TABLET | Freq: Every day | ORAL | Status: DC

## 2011-11-12 NOTE — Assessment & Plan Note (Signed)
Very severe Doesn't fit MDD criteria since mood is related to losses Referred to bereavement counselor Iran Sizer at [facility] May be helpful to speak to someone from transplant program Recheck 1 month---consider meds if not moving through process

## 2011-11-12 NOTE — Assessment & Plan Note (Signed)
Last creat still normal

## 2011-11-12 NOTE — Assessment & Plan Note (Signed)
Better but still not great Will reassess after we get his mood better

## 2011-11-12 NOTE — Progress Notes (Signed)
Subjective:    Patient ID: [name], male    DOB: 09-Apr-1960, 51 y.o.   MRN: [id number]  HPI Still having trouble with sugars Up to 140 units of the lantus Does try to work out Sugars still close to 200  Has been depressed Lost a few buddies in transplant program Daily feelings of depression Does have anhedonia This goes back a couple of months Has noticed memory issues Worried about supporting family---did do summer job but otherwise unemployed No suicidal ideation  Started on testosterone by urologist Stopped due to increased sugars Did feel better than  Current Outpatient Prescriptions on File Prior to Visit  Medication Sig Dispense Refill  . furosemide (LASIX) 80 MG tablet Take 80 mg by mouth 2 (two) times daily.       Marland Kitchen glipiZIDE (GLUCOTROL) 10 MG tablet Take 1 tablet (10 mg total) by mouth 2 (two) times daily before a meal.  60 tablet  11  . glucose blood (BAYER CONTOUR NEXT TEST) test strip Check blood glucose three times daily and as directed.  100 each  11  . insulin glargine (LANTUS) 100 UNIT/ML injection Inject 140 Units into the skin at bedtime.       . Insulin Pen Needle 31G X 8 MM MISC Inject as directed 4 times daily as needed  400 each  3  . metolazone (ZAROXOLYN) 2.5 MG tablet Take 2.5 mg by mouth daily.        . mometasone (NASONEX) 50 MCG/ACT nasal spray Place 2 sprays into the nose as needed.        . mycophenolate (MYFORTIC) 180 MG EC tablet Take 720 mg by mouth 2 (two) times daily.        Marland Kitchen omeprazole (PRILOSEC) 20 MG capsule Take 1 capsule (20 mg total) by mouth daily.  30 capsule  11  . potassium chloride (KLOR-CON) 20 MEQ packet Take 60 mEq by mouth 3 (three) times daily.       . tacrolimus (PROGRAF) 1 MG capsule Take 1 mg by mouth 2 (two) times daily.       Marland Kitchen DISCONTD: insulin aspart (NOVOLOG FLEXPEN) 100 UNIT/ML injection Inject 5 Units into the skin 3 (three) times daily before meals.  10 cartridge  12    No Known Allergies  Past Medical  History  Diagnosis Date  . Diabetes mellitus   . CAD (coronary artery disease) 2005    had CABG  . Hypertension   . Hyperlipidemia     low since renal failure  . ESRD (end stage renal disease) 2005    Dialysis then renal transplant    Past Surgical History  Procedure Date  . Kidney transplant 6/12    cadaver--at [facility]  . Coronary artery bypass graft 2005  . Tympanic membrane repair 1/12    left    Family History  Problem Relation Age of Onset  . Heart disease Father   . Diabetes Maternal Grandmother     History   Social History  . Marital Status: Married    Spouse Name: N/A    Number of Children: 2  . Years of Education: N/A   Occupational History  . 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 Smoking status: Never Smoker   . Smokeless tobacco: Never Used  . Alcohol Use: No  . Drug Use: No  . Sexually Active: Not on file   Other Topics Concern  . Not  on file   Social History Narrative   Has living willWife is health care POAWould accept resuscitation attemptsHasn't considered tube feedings   Review of Systems  Constitutional: Positive for fatigue. Negative for unexpected weight change.       Wears seat belt  HENT: Positive for hearing loss, congestion and rhinorrhea. Negative for dental problem and tinnitus.        Uses the nasonex--not clearly helpful Due for dentist  Eyes: Negative for discharge and visual disturbance.       Just had eye appt  Respiratory: Positive for cough. Negative for chest tightness and shortness of breath.        Dry cough chronically  Cardiovascular: Negative for chest pain, palpitations and leg swelling.  Gastrointestinal: Negative for nausea, vomiting, abdominal pain, constipation and blood in stool.       No heartburn or nausea  Genitourinary: Negative for dysuria and difficulty urinating.       Sig decreased libido and ED Testosterone helped  Musculoskeletal: Negative for  back pain, joint swelling and arthralgias.  Skin: Negative for rash.       Itchy area on upper right back---like a "zit" Lump in low right calf  Neurological: Positive for dizziness and light-headedness. Negative for syncope and headaches.       Off labetalol due to low blood pressure and dizziness---better off the med  Hematological: Negative for adenopathy. Bruises/bleeds easily.  Psychiatric/Behavioral: Positive for disturbed wake/sleep cycle and dysphoric mood. The patient is nervous/anxious.        Objective:   Physical Exam  Constitutional: He appears well-developed and well-nourished. No distress.  HENT:  Head: Normocephalic and atraumatic.  Right Ear: External ear normal.  Left Ear: External ear normal.  Mouth/Throat: Oropharynx is clear and moist. No oropharyngeal exudate.  Eyes: Conjunctivae and EOM are normal.  Neck: Normal range of motion. Neck supple. No thyromegaly present.  Cardiovascular: Normal rate, regular rhythm, normal heart sounds and intact distal pulses.  Exam reveals no gallop.   No murmur heard.      Faint pedal pulses  Pulmonary/Chest: Effort normal and breath sounds normal. No respiratory distress. He has no wheezes. He has no rales.  Abdominal: Soft. There is no tenderness.  Musculoskeletal: He exhibits no edema and no tenderness.  Lymphadenopathy:    He has no cervical adenopathy.  Skin:       Cyst vs varicosity on left calf Early seb keratosis on right upper back  Psychiatric: His behavior is normal.       Depressed and tearful Appropriate affect          Assessment & Plan:

## 2011-11-12 NOTE — Addendum Note (Signed)
Addended by: [name] I on: 11/12/2011 12:16 PM   Modules accepted: Orders

## 2011-11-12 NOTE — Patient Instructions (Signed)
Please set up an appointment with a counselor through your transplant program. If this is not available, call and I can set you up with our psychologist here. You can also call [place] and ask to speak to a bereavement counselor.

## 2011-11-12 NOTE — Assessment & Plan Note (Signed)
Discussed PSA---will not do now Will do stool immunoassay

## 2011-11-12 NOTE — Addendum Note (Signed)
Addended by: Despina Hidden on: 11/12/2011 12:35 PM   Modules accepted: Orders

## 2011-11-14 ENCOUNTER — Other Ambulatory Visit: Payer: Self-pay

## 2011-11-14 MED ORDER — INSULIN GLARGINE 100 UNIT/ML ~~LOC~~ SOLN: 140.0000 [IU] | Freq: Every day | SUBCUTANEOUS | Status: DC

## 2011-11-14 NOTE — Telephone Encounter (Addendum)
Pt request refill Lantus taking 140 units to [facility]. Pt notified by v/m to ck with pharmacy.

## 2011-11-15 ENCOUNTER — Other Ambulatory Visit: Payer: Self-pay | Admitting: *Deleted

## 2011-11-15 MED ORDER — MOMETASONE FUROATE 50 MCG/ACT NA SUSP: 2.0000 | Freq: Every day | NASAL | Status: DC

## 2011-12-09 ENCOUNTER — Telehealth: Payer: Self-pay

## 2011-12-09 ENCOUNTER — Telehealth: Payer: Self-pay | Admitting: Internal Medicine

## 2011-12-09 NOTE — Telephone Encounter (Signed)
[name] with [facility]; pt type 2 diabetic with oral med and insulin, kidney transplant 2012. Pt has non productive cough with nasal discharge that drains down back of throat, feels hot, chills (pt thermometer is broken). [name] call Dr Alla German office for disposition. Dr Everardo Beals CMA advise appt for tomorrow and if cannot wait go to UC today. Pt scheduled appt 12/10/11 at 11:15 with Dr Silvio Pate. [name] will advise pt if conditon changes or worsens before appt to go to UC.

## 2011-12-09 NOTE — Telephone Encounter (Signed)
Caller: [name]/Patient; Patient Name: [name]; PCP: [name] [facility]); Best Callback Phone Number: [phone]. 12/09/11 - patient has had a bad cough, clear nasal discharge, fever and chills since early this morning. Does not have a thermometer to take his temperature, but states he feels very warm. Diabetic and had Kidney Transplant 08/2010.  Emergent Sign/Symptom of "any temperature elevation in an immunocompromised individual or frail elderly" (disposition - call provider immediately) per Upper Respiratory Infection Protocol. Spoke with Reena (triage nurse) who advised patient be seen tomorrow at the office or to go to Urgent Care if feels he needs to be seen before then. Appointment scheduled for  12/10/11 @ 11:15 with Dr. Silvio Pate. Home Care Advice Given. Call Back Parameters Given.

## 2011-12-09 NOTE — Telephone Encounter (Signed)
Will evaluate at the appt

## 2011-12-10 ENCOUNTER — Ambulatory Visit (INDEPENDENT_AMBULATORY_CARE_PROVIDER_SITE_OTHER): Payer: Medicare Other | Admitting: Internal Medicine

## 2011-12-10 ENCOUNTER — Encounter: Payer: Self-pay | Admitting: Internal Medicine

## 2011-12-10 VITALS — BP 140/80 | HR 98 | Temp 97.7°F | Resp 16 | Ht 70.0 in | Wt 303.0 lb

## 2011-12-10 DIAGNOSIS — J019 Acute sinusitis, unspecified: Secondary | ICD-10-CM

## 2011-12-10 DIAGNOSIS — F4321 Adjustment disorder with depressed mood: Secondary | ICD-10-CM

## 2011-12-10 MED ORDER — AMOXICILLIN-POT CLAVULANATE 875-125 MG PO TABS: 1.0000 | ORAL_TABLET | Freq: Two times a day (BID) | ORAL | Status: DC

## 2011-12-10 MED ORDER — HYDROCODONE-HOMATROPINE 5-1.5 MG/5ML PO SYRP: 5.0000 mL | ORAL_SOLUTION | Freq: Every evening | ORAL | Status: DC | PRN

## 2011-12-10 NOTE — Assessment & Plan Note (Signed)
Seems to have systemic symptoms and is on immunosuppressants Will treat with augmentin and cough syrup

## 2011-12-10 NOTE — Assessment & Plan Note (Signed)
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

## 2011-12-10 NOTE — Progress Notes (Signed)
Subjective:    Patient ID: [name], male    DOB: September 02, 1960, 51 y.o.   MRN: [id number]  HPI Here with wife, Mateo Flow  Has had bad cough  Goes back 3-4 days Eyes are hurting---pressure behind them Lots of PND---clear and now yellow (rhinorrhea also) Feels that his ears are clogged and having trouble hearing  No fever but has had chills and some sweats No SOB  Did talk to friend who is retired [employer] reverend Has been talking to him daily Did get call from [place] at hospice but they never hooked up since he was talking with friend  Current Outpatient Prescriptions on File Prior to Visit  Medication Sig Dispense Refill  . furosemide (LASIX) 80 MG tablet Take 80 mg by mouth 2 (two) times daily.       Marland Kitchen glipiZIDE (GLUCOTROL) 10 MG tablet Take 1 tablet (10 mg total) by mouth 2 (two) times daily before a meal.  60 tablet  11  . glucose blood (BAYER CONTOUR NEXT TEST) test strip Check blood glucose three times daily and as directed.  100 each  11  . insulin aspart (NOVOLOG) 100 UNIT/ML injection Inject 20 Units into the skin 3 (three) times daily before meals.      . insulin glargine (LANTUS) 100 UNIT/ML injection Inject 140 Units into the skin at bedtime.  10 mL  3  . Insulin Pen Needle 31G X 8 MM MISC Inject as directed 4 times daily as needed  400 each  3  . metolazone (ZAROXOLYN) 2.5 MG tablet Take 2.5 mg by mouth daily.        . mometasone (NASONEX) 50 MCG/ACT nasal spray Place 2 sprays into the nose daily.  17 g  11  . mycophenolate (MYFORTIC) 180 MG EC tablet Take 720 mg by mouth 2 (two) times daily.        Marland Kitchen omeprazole (PRILOSEC) 20 MG capsule Take 1 capsule (20 mg total) by mouth daily.  30 capsule  11  . potassium chloride (KLOR-CON) 20 MEQ packet Take 60 mEq by mouth 3 (three) times daily.       . tacrolimus (PROGRAF) 1 MG capsule Take 1 mg by mouth 2 (two) times daily.       . traZODone (DESYREL) 50 MG tablet Take 0.5-2 tablets (25-100 mg total) by mouth at bedtime.  60  tablet  11    No Known Allergies  Past Medical History  Diagnosis Date  . Diabetes mellitus   . CAD (coronary artery disease) 2005    had CABG  . Hypertension   . Hyperlipidemia     low since renal failure  . ESRD (end stage renal disease) 2005    Dialysis then renal transplant    Past Surgical History  Procedure Date  . Kidney transplant 6/12    cadaver--at [facility]  . Coronary artery bypass graft 2005  . Tympanic membrane repair 1/12    left    Family History  Problem Relation Age of Onset  . Heart disease Father   . Diabetes Maternal Grandmother     History   Social History  . Marital Status: Married    Spouse Name: N/A    Number of Children: 2  . Years of Education: N/A   Occupational History  . 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 Smoking status: Never Smoker   . Smokeless tobacco: Never Used  . Alcohol Use:  No  . Drug Use: No  . Sexually Active: Not on file   Other Topics Concern  . Not on file   Social History Narrative   Has living willWife is health care POAWould accept resuscitation attemptsHasn't considered tube feedings   Review of Systems No vomiting. Some nausea he relates to all the phlegm Loose, frequent stools (5-6 yesterday) Appetite is off but he is eating    Objective:   Physical Exam  Constitutional: He appears well-developed and well-nourished. No distress.  HENT:  Mouth/Throat: Oropharynx is clear and moist. No oropharyngeal exudate.       Bilateral maxillary tenderness Moderate nasal congestion TMs fine  Neck: Normal range of motion. Neck supple.  Pulmonary/Chest: Effort normal and breath sounds normal. No respiratory distress. He has no wheezes. He has no rales.  Lymphadenopathy:    He has no cervical adenopathy.  Psychiatric: He has a normal mood and affect. His behavior is normal.          Assessment & Plan:

## 2011-12-13 ENCOUNTER — Ambulatory Visit: Payer: Medicare Other | Admitting: Internal Medicine

## 2011-12-19 ENCOUNTER — Other Ambulatory Visit: Payer: Medicare Other

## 2011-12-19 DIAGNOSIS — Z1211 Encounter for screening for malignant neoplasm of colon: Secondary | ICD-10-CM

## 2011-12-19 LAB — FECAL OCCULT BLOOD, IMMUNOCHEMICAL: Fecal Occult Bld: NEGATIVE

## 2011-12-26 ENCOUNTER — Telehealth: Payer: Self-pay | Admitting: Internal Medicine

## 2011-12-26 NOTE — Telephone Encounter (Signed)
Refill- lantus solostar 100units. Inject 140 units(1.57m) subcutaneously into the skin at bedtime. Qty 460mlast fill 8.23.13

## 2011-12-27 MED ORDER — INSULIN GLARGINE 100 UNIT/ML ~~LOC~~ SOLN: 150.0000 [IU] | Freq: Every day | SUBCUTANEOUS | Status: DC

## 2011-12-27 NOTE — Telephone Encounter (Signed)
Patient called back and he uses 150 units & uses the insulin pen. I spoke with the pharmacist at [facility] and pt would need 15 pens for a 30day supply. rx sent to pharmacy by e-script

## 2011-12-27 NOTE — Telephone Encounter (Signed)
.  left message to have patient return my call, need to know how many units he uses and what kind of insulin? Vial or pen? Need pen needles?

## 2012-02-06 ENCOUNTER — Other Ambulatory Visit: Payer: Self-pay

## 2012-02-06 MED ORDER — INSULIN ASPART 100 UNIT/ML ~~LOC~~ SOLN: 20.0000 [IU] | Freq: Three times a day (TID) | SUBCUTANEOUS | Status: DC

## 2012-02-06 NOTE — Telephone Encounter (Signed)
rx sent to pharmacy by e-script Spoke with patient and advised number of surgeon, Dr.Kuzma [phone]

## 2012-02-06 NOTE — Telephone Encounter (Signed)
Pt request refill Novolog to [facility]; last A1c dated 01/30/12 was 9.3.Please advise. Pt also request name of hand specialist referred to pt wants to call and reschedule appt.

## 2012-02-12 ENCOUNTER — Encounter: Payer: Self-pay | Admitting: Internal Medicine

## 2012-02-12 NOTE — Telephone Encounter (Signed)
I have not seen a form and nothing is scanned into his chart. Please advise

## 2012-02-18 ENCOUNTER — Other Ambulatory Visit: Payer: Self-pay | Admitting: Orthopedic Surgery

## 2012-02-28 ENCOUNTER — Encounter (HOSPITAL_BASED_OUTPATIENT_CLINIC_OR_DEPARTMENT_OTHER): Payer: Self-pay | Admitting: *Deleted

## 2012-02-28 NOTE — Progress Notes (Signed)
Had labs this week [facility]-sees cardiology [place]-will get notes Denies any sleep apnea-said they want to do a test on him sometime soon-

## 2012-03-02 ENCOUNTER — Encounter (HOSPITAL_BASED_OUTPATIENT_CLINIC_OR_DEPARTMENT_OTHER): Payer: Self-pay | Admitting: *Deleted

## 2012-03-02 NOTE — Pre-Procedure Instructions (Signed)
Multiple records in [facility])

## 2012-03-03 ENCOUNTER — Ambulatory Visit (HOSPITAL_BASED_OUTPATIENT_CLINIC_OR_DEPARTMENT_OTHER): Payer: Medicare Other | Admitting: *Deleted

## 2012-03-03 ENCOUNTER — Encounter (HOSPITAL_BASED_OUTPATIENT_CLINIC_OR_DEPARTMENT_OTHER): Payer: Self-pay | Admitting: *Deleted

## 2012-03-03 ENCOUNTER — Encounter (HOSPITAL_BASED_OUTPATIENT_CLINIC_OR_DEPARTMENT_OTHER)
Admission: RE | Disposition: A | Discharge status: Home / Self Care | Payer: Self-pay | Source: Ambulatory Visit | Attending: Orthopedic Surgery

## 2012-03-03 ENCOUNTER — Ambulatory Visit (HOSPITAL_BASED_OUTPATIENT_CLINIC_OR_DEPARTMENT_OTHER)
Admission: RE | Admit: 2012-03-03 | Discharge: 2012-03-03 | Disposition: A | Discharge status: Home / Self Care | Payer: Medicare Other | Source: Ambulatory Visit | Attending: Orthopedic Surgery | Admitting: Orthopedic Surgery

## 2012-03-03 DIAGNOSIS — K219 Gastro-esophageal reflux disease without esophagitis: Secondary | ICD-10-CM | POA: Insufficient documentation

## 2012-03-03 DIAGNOSIS — Z951 Presence of aortocoronary bypass graft: Secondary | ICD-10-CM | POA: Insufficient documentation

## 2012-03-03 DIAGNOSIS — E785 Hyperlipidemia, unspecified: Secondary | ICD-10-CM | POA: Insufficient documentation

## 2012-03-03 DIAGNOSIS — Z6841 Body Mass Index (BMI) 40.0 and over, adult: Secondary | ICD-10-CM | POA: Insufficient documentation

## 2012-03-03 DIAGNOSIS — Z794 Long term (current) use of insulin: Secondary | ICD-10-CM | POA: Insufficient documentation

## 2012-03-03 DIAGNOSIS — M720 Palmar fascial fibromatosis [Dupuytren]: Secondary | ICD-10-CM | POA: Insufficient documentation

## 2012-03-03 DIAGNOSIS — Z833 Family history of diabetes mellitus: Secondary | ICD-10-CM | POA: Insufficient documentation

## 2012-03-03 DIAGNOSIS — N186 End stage renal disease: Secondary | ICD-10-CM | POA: Insufficient documentation

## 2012-03-03 DIAGNOSIS — Z8249 Family history of ischemic heart disease and other diseases of the circulatory system: Secondary | ICD-10-CM | POA: Insufficient documentation

## 2012-03-03 DIAGNOSIS — I2510 Atherosclerotic heart disease of native coronary artery without angina pectoris: Secondary | ICD-10-CM | POA: Insufficient documentation

## 2012-03-03 DIAGNOSIS — I120 Hypertensive chronic kidney disease with stage 5 chronic kidney disease or end stage renal disease: Secondary | ICD-10-CM | POA: Insufficient documentation

## 2012-03-03 DIAGNOSIS — Z940 Kidney transplant status: Secondary | ICD-10-CM | POA: Insufficient documentation

## 2012-03-03 DIAGNOSIS — E119 Type 2 diabetes mellitus without complications: Secondary | ICD-10-CM | POA: Insufficient documentation

## 2012-03-03 HISTORY — DX: Kidney transplant status: Z94.0

## 2012-03-03 HISTORY — DX: Major depressive disorder, single episode, unspecified: F32.9

## 2012-03-03 HISTORY — DX: Depression, unspecified: F32.A

## 2012-03-03 HISTORY — DX: Gastro-esophageal reflux disease without esophagitis: K21.9

## 2012-03-03 HISTORY — PX: FASCIOTOMY: SHX132

## 2012-03-03 LAB — GLUCOSE, CAPILLARY: Glucose-Capillary: 208 mg/dL — ABNORMAL HIGH (ref 70–99)

## 2012-03-03 LAB — POCT I-STAT, CHEM 8
BUN: 21 mg/dL (ref 6–23)
Chloride: 91 mEq/L — ABNORMAL LOW (ref 96–112)
Sodium: 138 mEq/L (ref 135–145)

## 2012-03-03 SURGERY — FASCIOTOMY, UPPER EXTREMITY
Anesthesia: Regional | Site: Finger | Laterality: Right | Wound class: Clean

## 2012-03-03 MED ORDER — CHLORHEXIDINE GLUCONATE 4 % EX LIQD: 60.0000 mL | Freq: Once | CUTANEOUS | Status: DC

## 2012-03-03 MED ORDER — BUPIVACAINE HCL (PF) 0.25 % IJ SOLN
INTRAMUSCULAR | Status: DC | PRN
  Administered 2012-03-03: 5 mL

## 2012-03-03 MED ORDER — HYDROCODONE-ACETAMINOPHEN 5-325 MG PO TABS: 1.0000 | ORAL_TABLET | Freq: Four times a day (QID) | ORAL | Status: DC | PRN

## 2012-03-03 MED ORDER — CEFAZOLIN SODIUM-DEXTROSE 2-3 GM-% IV SOLR
2.0000 g | INTRAVENOUS | Status: AC
  Administered 2012-03-03: 2 g via INTRAVENOUS

## 2012-03-03 MED ORDER — PROPOFOL 10 MG/ML IV EMUL
INTRAVENOUS | Status: DC | PRN
  Administered 2012-03-03: 200 ug/kg/min via INTRAVENOUS

## 2012-03-03 MED ORDER — LACTATED RINGERS IV SOLN: INTRAVENOUS | Status: DC

## 2012-03-03 MED ORDER — PROPOFOL 10 MG/ML IV BOLUS
INTRAVENOUS | Status: DC | PRN
  Administered 2012-03-03: 25 mg via INTRAVENOUS

## 2012-03-03 MED ORDER — FAMOTIDINE 20 MG PO TABS
20.0000 mg | ORAL_TABLET | Freq: Once | ORAL | Status: AC
  Administered 2012-03-03: 20 mg via ORAL

## 2012-03-03 MED ORDER — SODIUM CHLORIDE 0.9 % IV SOLN
INTRAVENOUS | Status: DC
  Administered 2012-03-03: 08:00:00 via INTRAVENOUS

## 2012-03-03 MED ORDER — FENTANYL CITRATE 0.05 MG/ML IJ SOLN
INTRAMUSCULAR | Status: DC | PRN
  Administered 2012-03-03: 50 ug via INTRAVENOUS
  Administered 2012-03-03: 25 ug via INTRAVENOUS

## 2012-03-03 MED ORDER — LIDOCAINE HCL (PF) 0.5 % IJ SOLN
INTRAMUSCULAR | Status: DC | PRN
  Administered 2012-03-03: 30 mL via INTRAVENOUS

## 2012-03-03 SURGICAL SUPPLY — 48 items
BANDAGE GAUZE ELAST BULKY 4 IN (GAUZE/BANDAGES/DRESSINGS) ×2 IMPLANT
BLADE MINI RND TIP GREEN BEAV (BLADE) ×2 IMPLANT
BLADE SURG 15 STRL LF DISP TIS (BLADE) ×1 IMPLANT
BLADE SURG 15 STRL SS (BLADE) ×1
BNDG CMPR 9X4 STRL LF SNTH (GAUZE/BANDAGES/DRESSINGS)
BNDG COHESIVE 3X5 TAN STRL LF (GAUZE/BANDAGES/DRESSINGS) ×2 IMPLANT
BNDG ESMARK 4X9 LF (GAUZE/BANDAGES/DRESSINGS) IMPLANT
CHLORAPREP W/TINT 26ML (MISCELLANEOUS) ×2 IMPLANT
CLOTH BEACON ORANGE TIMEOUT ST (SAFETY) ×2 IMPLANT
CORDS BIPOLAR (ELECTRODE) ×2 IMPLANT
COVER MAYO STAND STRL (DRAPES) ×2 IMPLANT
COVER TABLE BACK 60X90 (DRAPES) ×2 IMPLANT
CUFF TOURNIQUET SINGLE 18IN (TOURNIQUET CUFF) ×2 IMPLANT
DECANTER SPIKE VIAL GLASS SM (MISCELLANEOUS) IMPLANT
DRAPE EXTREMITY T 121X128X90 (DRAPE) ×2 IMPLANT
DRAPE SURG 17X23 STRL (DRAPES) ×2 IMPLANT
DRSG KUZMA FLUFF (GAUZE/BANDAGES/DRESSINGS) IMPLANT
GAUZE XEROFORM 1X8 LF (GAUZE/BANDAGES/DRESSINGS) ×2 IMPLANT
GLOVE BIO SURGEON STRL SZ 6.5 (GLOVE) ×2 IMPLANT
GLOVE BIO SURGEON STRL SZ7 (GLOVE) ×2 IMPLANT
GLOVE BIOGEL PI IND STRL 7.0 (GLOVE) ×2 IMPLANT
GLOVE BIOGEL PI INDICATOR 7.0 (GLOVE) ×2
GLOVE ECLIPSE 6.5 STRL STRAW (GLOVE) ×2 IMPLANT
GLOVE SURG ORTHO 8.0 STRL STRW (GLOVE) ×2 IMPLANT
GOWN BRE IMP PREV XXLGXLNG (GOWN DISPOSABLE) ×4 IMPLANT
GOWN PREVENTION PLUS XLARGE (GOWN DISPOSABLE) ×4 IMPLANT
LOOP VESSEL MAXI BLUE (MISCELLANEOUS) IMPLANT
NS IRRIG 1000ML POUR BTL (IV SOLUTION) ×2 IMPLANT
PACK BASIN DAY SURGERY FS (CUSTOM PROCEDURE TRAY) ×2 IMPLANT
PAD CAST 3X4 CTTN HI CHSV (CAST SUPPLIES) IMPLANT
PADDING CAST ABS 3INX4YD NS (CAST SUPPLIES)
PADDING CAST ABS 4INX4YD NS (CAST SUPPLIES) ×1
PADDING CAST ABS COTTON 3X4 (CAST SUPPLIES) IMPLANT
PADDING CAST ABS COTTON 4X4 ST (CAST SUPPLIES) ×1 IMPLANT
PADDING CAST COTTON 3X4 STRL (CAST SUPPLIES)
SLEEVE SCD COMPRESS KNEE MED (MISCELLANEOUS) IMPLANT
SPLINT PLASTER CAST XFAST 3X15 (CAST SUPPLIES) ×8 IMPLANT
SPLINT PLASTER XTRA FASTSET 3X (CAST SUPPLIES) ×8
SPONGE GAUZE 4X4 12PLY (GAUZE/BANDAGES/DRESSINGS) ×2 IMPLANT
STOCKINETTE 4X48 STRL (DRAPES) ×2 IMPLANT
SUT SILK 2 0 FS (SUTURE) IMPLANT
SUT VICRYL RAPID 5 0 P 3 (SUTURE) IMPLANT
SUT VICRYL RAPIDE 4/0 PS 2 (SUTURE) ×2 IMPLANT
SYR BULB 3OZ (MISCELLANEOUS) ×2 IMPLANT
SYR CONTROL 10ML LL (SYRINGE) ×2 IMPLANT
TOWEL OR 17X24 6PK STRL BLUE (TOWEL DISPOSABLE) ×4 IMPLANT
UNDERPAD 30X30 INCONTINENT (UNDERPADS AND DIAPERS) ×2 IMPLANT
WATER STERILE IRR 1000ML POUR (IV SOLUTION) IMPLANT

## 2012-03-03 NOTE — Op Note (Signed)
Other Dictation: Dictation Number [id number]

## 2012-03-03 NOTE — Anesthesia Postprocedure Evaluation (Signed)
Anesthesia Post Note  Patient: [name]  Procedure(s) Performed: Procedure(s) (LRB): FASCIOTOMY (Right)  Anesthesia type: MAC and bier block  Patient location: PACU  Post pain: Pain level controlled and Adequate analgesia  Post assessment: Post-op Vital signs reviewed, Patient's Cardiovascular Status Stable and Respiratory Function Stable  Last Vitals:  Filed Vitals:   03/03/12 1015  BP: 131/70  Pulse: 66  Temp:   Resp: 18    Post vital signs: Reviewed and stable  Level of consciousness: awake, alert  and oriented  Complications: No apparent anesthesia complications

## 2012-03-03 NOTE — Anesthesia Preprocedure Evaluation (Addendum)
Anesthesia Evaluation  Patient identified by MRN, date of birth, ID band Patient awake    Reviewed: Allergy & Precautions, H&P , NPO status , Patient's Chart, lab work & pertinent test results  Airway Mallampati: II  Neck ROM: full    Dental   Pulmonary          Cardiovascular hypertension, + CAD and + CABG     Neuro/Psych Depression    GI/Hepatic GERD-  ,  Endo/Other  diabetes, Type 2Morbid obesity  Renal/GU Renal diseaseS/p renal transplant in 2012     Musculoskeletal   Abdominal   Peds  Hematology   Anesthesia Other Findings   Reproductive/Obstetrics                           Anesthesia Physical Anesthesia Plan  ASA: III  Anesthesia Plan: Regional and Bier Block   Post-op Pain Management:    Induction: Intravenous  Airway Management Planned: Simple Face Mask  Additional Equipment:   Intra-op Plan:   Post-operative Plan:   Informed Consent: I have reviewed the patients History and Physical, chart, labs and discussed the procedure including the risks, benefits and alternatives for the proposed anesthesia with the patient or authorized representative who has indicated his/her understanding and acceptance.     Plan Discussed with: CRNA and Surgeon  Anesthesia Plan Comments:         Anesthesia Quick Evaluation

## 2012-03-03 NOTE — H&P (Signed)
:   [name] is a 51 year-old left-hand dominant male who comes in referred by Dr. Silvio Pate for consultation with respect to flexion deformities of his right small finger and cord to his right thumb.  He has had similar problems on his left side.  He states he fell approximately three months ago straightening out his left little finger. He has no prior history of injury.  He does have history of diabetes and a kidney transplant on 09/13/10.  He is complaining of mild discomfort, aching in nature to the little finger.  Massage has helped.  He has no history of thyroid problems, arthritis or gout.   ALLERGIES:   None.  MEDICATIONS:    Tacrolimus, mycophenolate sodium, valganciclovir, trimethoprim-sulfamethoxazole, omeprazole, labetalol, bupropion, furosemide, metolazone, glargine, glipizide, potassium chloride, potassium phosphate, MagOx, B complex-C-Folic acid and mometasone furoate.  SURGICAL HISTORY:    No other surgeries than previously mentioned.  FAMILY MEDICAL HISTORY:    Positive for diabetes, heart disease, high blood pressure.    SOCIAL HISTORY:      He does not smoke or drink.  He is married, disabled.   REVIEW OF SYSTEMS:     Positive for glasses, high blood pressure, kidney disease, depression, otherwise negative 14 points. [name] is an 51 y.o. male.   Chief Complaint: Dupuytren.s RSF HPI: see above  Past Medical History  Diagnosis Date  . Diabetes mellitus   . CAD (coronary artery disease) 2005    had CABG  . Hypertension   . Hyperlipidemia     low since renal failure  . ESRD (end stage renal disease) 2005    Dialysis then renal transplant  . Kidney transplant status, cadaveric 2012  . Depression   . GERD (gastroesophageal reflux disease)     Past Surgical History  Procedure Date  . Kidney transplant 09/13/2010    cadaver--at [facility]  . Coronary artery bypass graft 2005  . Tympanic membrane repair 1/12    left  . Dg angio av shunt*l*     right and left  upper arms    Family History  Problem Relation Age of Onset  . Heart disease Father   . Diabetes Maternal Grandmother    Social History:  reports that he has never smoked. He has never used smokeless tobacco. He reports that he does not drink alcohol or use illicit drugs.  Allergies: No Known Allergies  Medications Prior to Admission  Medication Sig Dispense Refill  . furosemide (LASIX) 80 MG tablet Take 80 mg by mouth 2 (two) times daily.       Marland Kitchen glipiZIDE (GLUCOTROL) 10 MG tablet Take 1 tablet (10 mg total) by mouth 2 (two) times daily before a meal.  60 tablet  11  . insulin aspart (NOVOLOG) 100 UNIT/ML injection Inject 20 Units into the skin 3 (three) times daily before meals.  2 vial  11  . insulin glargine (LANTUS) 100 UNIT/ML injection Inject 110 Units into the skin at bedtime.      . metolazone (ZAROXOLYN) 2.5 MG tablet Take 2.5 mg by mouth daily.        . mometasone (NASONEX) 50 MCG/ACT nasal spray Place 2 sprays into the nose daily.  17 g  11  . mycophenolate (MYFORTIC) 180 MG EC tablet Take 720 mg by mouth 2 (two) times daily.       Marland Kitchen omeprazole (PRILOSEC) 20 MG capsule Take 1 capsule (20 mg total) by mouth daily.  30 capsule  11  .  potassium chloride (KLOR-CON) 20 MEQ packet Take 60 mEq by mouth 3 (three) times daily.       . tacrolimus (PROGRAF) 1 MG capsule Take 1 mg by mouth 2 (two) times daily.       Marland Kitchen glucose blood (BAYER CONTOUR NEXT TEST) test strip Check blood glucose three times daily and as directed.  100 each  11  . HYDROcodone-homatropine (HYCODAN) 5-1.5 MG/5ML syrup Take 5 mLs by mouth at bedtime as needed for cough.  120 mL  0  . Insulin Pen Needle 31G X 8 MM MISC Inject as directed 4 times daily as needed  400 each  3  . traZODone (DESYREL) 50 MG tablet Take 25 mg by mouth at bedtime.        Results for orders placed during the hospital encounter of 03/03/12 (from the past 48 hour(s))  POCT I-STAT, CHEM 8     Status: Abnormal   Collection Time   03/03/12   7:30 AM      Component Value Range Comment   Sodium 138  135 - 145 mEq/L    Potassium 2.8 (*) 3.5 - 5.1 mEq/L    Chloride 91 (*) 96 - 112 mEq/L    BUN 21  6 - 23 mg/dL    Creatinine, Ser 1.20  0.50 - 1.35 mg/dL    Glucose, Bld 211 (*) 70 - 99 mg/dL    Calcium, Ion 1.15  1.12 - 1.23 mmol/L    TCO2 39  0 - 100 mmol/L    Hemoglobin 16.0  13.0 - 17.0 g/dL    HCT 47.0  39.0 - 52.0 %    Comment NOTIFIED PHYSICIAN       No results found.   Pertinent items are noted in HPI.  Blood pressure 149/81, pulse 63, temperature 98.1 F (36.7 C), temperature source Oral, resp. rate 20, height _0  (1.778 m), weight 143.96 kg (317 lb 6 oz), SpO2 93.00%.  General appearance: alert, cooperative and appears stated age Head: Normocephalic, without obvious abnormality Neck: no adenopathy Resp: clear to auscultation bilaterally Cardio: regular rate and rhythm, S1, S2 normal, no murmur, click, rub or gallop GI: soft, non-tender; bowel sounds normal; no masses,  no organomegaly Extremities: extremities normal, atraumatic, no cyanosis or edema Pulses: 2+ and symmetric Skin: Skin color, texture, turgor normal. No rashes or lesions Neurologic: Grossly normal Incision/Wound: na  Assessment/Plan DIAGNOSIS:     Dupuytren's contracture, right little finger.    RECOMMENDATIONS/PLAN:   We have discussed various treatment alternatives including aponeurotomy, Xiaflex injection and surgical excision with either a fasciotomy or fasciectomy, the risks and complications of each are discussed with them.  He is aware of the potential for recurrence, injury to arteries, nerves, tendons, incomplete relief of symptoms of each of the various treatment alternatives.  He has elected to undergo a fasciotomy, open.  This will be scheduled as an outpatient.  He is aware of the risks of injury to arteries, nerves, tendons, recurrence, splinting, open areas on his skin.  He wants no part of Xiaflex.  He is offered to be sent to  Dr. Gwyneth Sprout at [facility] for aponeurotomy should he elect.  He has elected for fasciotomy rather than fasciectomy.  This will be scheduled to his little finger.  Shannell Mikkelsen R 03/03/2012, 8:33 AM

## 2012-03-03 NOTE — Progress Notes (Signed)
Dr Marcie Bal aware of istat results

## 2012-03-03 NOTE — Anesthesia Procedure Notes (Signed)
Procedure Name: MAC Date/Time: 03/03/2012 9:02 AM Performed by: Carney Corners Pre-anesthesia Checklist: Patient identified, Emergency Drugs available, Suction available and Patient being monitored Patient Re-evaluated:Patient Re-evaluated prior to inductionOxygen Delivery Method: Simple face mask Preoxygenation: Pre-oxygenation with 100% oxygen

## 2012-03-03 NOTE — Op Note (Signed)
[name], [name]                ACCOUNT NO.:  [id number]  MEDICAL RECORD NO.:  [id number]  LOCATION:                                 FACILITY:  PHYSICIAN:  Daryll Brod, M.D.            DATE OF BIRTH:  DATE OF PROCEDURE:  03/03/2012 DATE OF DISCHARGE:                              OPERATIVE REPORT   PREOPERATIVE DIAGNOSIS:  Dupuytren contracture, right 5th finger.  POSTOPERATIVE DIAGNOSIS:  Dupuytren contracture, right 5th finger.  OPERATION:  Fasciotomies, right small finger.  SURGEON:  Daryll Brod, MD  ASSISTANT:  Leanora Cover, MD  ANESTHESIA:  Forearm-based IV regional with local infiltration.  ANESTHESIOLOGIST:  Albertha Ghee, MD  HISTORY:  The patient is a 51 year old male with history of Dupuytren contracture of his right little finger with significant deformity of the metacarpophalangeal joint primarily.  He has elected to undergo fasciotomy being aware of other alternatives including epineurotomy, Xiaflex injection, fasciectomy.  Pre, peri, and postoperative course have been discussed along with risks and complications.  He is aware of the potential for injury to arteries, nerves, tendons, incomplete relief of symptoms, dystrophy, possibility of recurrence, extension, complex regional pain open areas following the release.  In preoperative area, the patient is seen, the extremity marked by both patient and surgeon. Antibiotic given.  PROCEDURE:  The patient was brought to the operating room where a forearm-based IV regional anesthetic was carried out without difficulty. He was prepped using ChloraPrep, supine position, right arm free.  After adequate anesthesia was afforded, a transverse incision was made over the proximal aspect of the palmar fascia, the right small finger, carried down through subcutaneous tissue.  Retractors placed with sharp dissection.  Under magnification, the fascia was released proximally taking care to protect neurovascular structures.  A  2nd transverse incision was made distally over the metacarpal.  Again a blunt dissection carried down to the cord.  The cord identified, retractors placed protecting neurovascular structures and the cord released.  This allowed the finger to come nearly straight at the metacarpophalangeal joint.  The third transverse incision was made, deepened with a blunt dissection.  Retractors placed for protection of neurovascular bundles. The cord released allowing the finger to come fully straight.  We decided not to proceed onto the finger.  The wounds were copiously irrigated with saline and partially closed with interrupted 4-0 Vicryl Rapide sutures.  A sterile compressive dressing was applied after an injection of the area with a local anesthetic, 0.25% Marcaine without epinephrine 5 mL was used.  Sterile compressive dressing was applied. On deflation of the tourniquet, all fingers were immediately pinked.  He was taken to the recovery room for observation.  He will be discharged on Vicodin to return in 1 week.          ______________________________ Daryll Brod, M.D.     GK/MEDQ  D:  03/03/2012  T:  03/03/2012  Job:  [id number]

## 2012-03-03 NOTE — Transfer of Care (Signed)
Immediate Anesthesia Transfer of Care Note  Patient: [name]  Procedure(s) Performed: Procedure(s) (LRB) with comments: FASCIOTOMY (Right) - FASCIOTOMY RIGHT SMALL FINGER  Patient Location: PACU  Anesthesia Type:Bier block  Level of Consciousness: awake, alert  and oriented  Airway & Oxygen Therapy: Patient Spontanous Breathing and Patient connected to face mask oxygen  Post-op Assessment: Report given to PACU RN, Post -op Vital signs reviewed and stable and Patient moving all extremities  Post vital signs: Reviewed and stable  Complications: No apparent anesthesia complications

## 2012-03-03 NOTE — Brief Op Note (Signed)
03/03/2012  9:34 AM  PATIENT:  [name]  51 y.o. male  PRE-OPERATIVE DIAGNOSIS:  DEPUYTRENS CONTRACTURE RIGHT SMALL FINGER  POST-OPERATIVE DIAGNOSIS:  depuytrens contracture right small finger  PROCEDURE:  Procedure(s) (LRB) with comments: FASCIOTOMY (Right) - FASCIOTOMY RIGHT SMALL FINGER  SURGEON:  Surgeon(s) and Role:    * [name], MD - Primary    * Tennis Must, MD - Assisting  PHYSICIAN ASSISTANT:   ASSISTANTS: K Doralene Glanz,MD   ANESTHESIA:   local and regional  EBL:  Total I/O In: 400 [I.V.:400] Out: -   BLOOD ADMINISTERED:none  DRAINS: none   LOCAL MEDICATIONS USED:  MARCAINE     SPECIMEN:  No Specimen  DISPOSITION OF SPECIMEN:  N/A  COUNTS:  YES  TOURNIQUET:   Total Tourniquet Time Documented: Forearm (Right) - 18 minutes  DICTATION: .Other Dictation: Dictation Number [id number]  PLAN OF CARE: Discharge to home after PACU  PATIENT DISPOSITION:  PACU - hemodynamically stable.

## 2012-03-03 NOTE — Anesthesia Postprocedure Evaluation (Deleted)
Anesthesia Post Note  Patient: [name]  Procedure(s) Performed: Procedure(s) (LRB): FASCIOTOMY (Right)  Anesthesia type: General  Patient location: PACU  Post pain: Pain level controlled and Adequate analgesia  Post assessment: Post-op Vital signs reviewed, Patient's Cardiovascular Status Stable, Respiratory Function Stable, Patent Airway and Pain level controlled  Last Vitals:  Filed Vitals:   03/03/12 1015  BP: 131/70  Pulse: 66  Temp:   Resp: 18    Post vital signs: Reviewed and stable  Level of consciousness: awake, alert  and oriented  Complications: No apparent anesthesia complications

## 2012-03-04 ENCOUNTER — Encounter (HOSPITAL_BASED_OUTPATIENT_CLINIC_OR_DEPARTMENT_OTHER): Payer: Self-pay | Admitting: Orthopedic Surgery

## 2012-03-04 ENCOUNTER — Other Ambulatory Visit: Payer: Self-pay | Admitting: *Deleted

## 2012-03-04 MED ORDER — OMEPRAZOLE 20 MG PO CPDR: 20.0000 mg | DELAYED_RELEASE_CAPSULE | Freq: Every day | ORAL | Status: DC

## 2012-03-15 ENCOUNTER — Ambulatory Visit: Payer: Self-pay

## 2012-03-31 ENCOUNTER — Encounter: Payer: Self-pay | Admitting: Family Medicine

## 2012-03-31 ENCOUNTER — Ambulatory Visit (INDEPENDENT_AMBULATORY_CARE_PROVIDER_SITE_OTHER): Payer: 59 | Admitting: Family Medicine

## 2012-03-31 VITALS — BP 136/84 | HR 73 | Temp 98.1°F | Wt 324.2 lb

## 2012-03-31 DIAGNOSIS — R059 Cough, unspecified: Secondary | ICD-10-CM

## 2012-03-31 DIAGNOSIS — R05 Cough: Secondary | ICD-10-CM

## 2012-03-31 DIAGNOSIS — E662 Morbid (severe) obesity with alveolar hypoventilation: Secondary | ICD-10-CM | POA: Insufficient documentation

## 2012-03-31 HISTORY — DX: Morbid (severe) obesity with alveolar hypoventilation: E66.2

## 2012-03-31 LAB — POCT INFLUENZA A/B
Influenza A, POC: NEGATIVE
Influenza B, POC: NEGATIVE

## 2012-03-31 MED ORDER — AMOXICILLIN-POT CLAVULANATE 875-125 MG PO TABS: 1.0000 | ORAL_TABLET | Freq: Two times a day (BID) | ORAL | Status: AC

## 2012-03-31 MED ORDER — HYDROCODONE-HOMATROPINE 5-1.5 MG/5ML PO SYRP: 5.0000 mL | ORAL_SOLUTION | Freq: Every evening | ORAL | Status: DC | PRN

## 2012-03-31 NOTE — Assessment & Plan Note (Addendum)
With myalgias/arthralgias, will check for flu with nasal swab - neg Given head > chest sxs, anticipate acute sinusitis. Immunocompromised with prograf. Given progression and comorbidities, will treat aggressively with augmentin. Hycodan for cough. Return if red flags or concerns.

## 2012-03-31 NOTE — Patient Instructions (Signed)
I'm sorry you're not feeling well today. I do think you have return of sinusitis.  Treat with hycodan cough syrup and augmentin. Flu test negative today. Push fluids and plenty of rest. May use simple mucinex with plenty of fluid to help mobilize mucous. Please return if you are not improving as expected, or if you have high fevers (>101.5) or difficulty swallowing or worsening productive cough. Call clinic with questions.  Good to see you today.

## 2012-03-31 NOTE — Progress Notes (Signed)
  Subjective:    Patient ID: [name], male    DOB: 1961/02/03, 52 y.o.   MRN: [id number]  HPI CC: cough, fever, myalgias.  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  Yesterday subjective chills/fevers.  Ears clogged up.  +PNdrainage, ST.  Head congestion > chest congestion.  + myalgia/arthralgias and body aches.  Progressive onset.  + coughing fits that lead to gagging, sweating and dizziness.  So far has tried codeine cough med and robitussin, as well as alka seltzer plus.  No abd pain, ear or tooth pain, shortness of breath or wheezing.  Did receive flu shot this year. To leave next week for wedding to [facility]. Mother sick recently, son sick 2 wks ago, wife sick recently. No smokers at home. No h/o asthma, COPD  Feels like he felt with prior sinus infection 11/2011, treated with augmentin.  Past Medical History  Diagnosis Date  . Diabetes mellitus   . CAD (coronary artery disease) 2005    had CABG  . Hypertension   . Hyperlipidemia     low since renal failure  . ESRD (end stage renal disease) 2005    Dialysis then renal transplant  . Kidney transplant status, cadaveric 2012  . Depression   . GERD (gastroesophageal reflux disease)      Review of Systems Per HPI    Objective:   Physical Exam  Nursing note and vitals reviewed. Constitutional: He appears well-developed and well-nourished. No distress.  HENT:  Head: Normocephalic and atraumatic.  Right Ear: Hearing, tympanic membrane, external ear and ear canal normal.  Left Ear: Hearing, tympanic membrane, external ear and ear canal normal.  Nose: Mucosal edema present. No rhinorrhea. Right sinus exhibits maxillary sinus tenderness. Right sinus exhibits no frontal sinus tenderness. Left sinus exhibits maxillary sinus tenderness. Left sinus exhibits no frontal sinus tenderness.  Mouth/Throat: Uvula  is midline, oropharynx is clear and moist and mucous membranes are normal. No oropharyngeal exudate, posterior oropharyngeal edema, posterior oropharyngeal erythema or tonsillar abscesses.       Mild maxillary sinus discomfort Nasal congestion, purulent discharge present in nose  Eyes: Conjunctivae normal and EOM are normal. Pupils are equal, round, and reactive to light. No scleral icterus.  Neck: Normal range of motion. Neck supple.  Cardiovascular: Normal rate, regular rhythm, normal heart sounds and intact distal pulses.   No murmur heard. Pulmonary/Chest: Effort normal. No respiratory distress. He has no wheezes. He has no rales.  Lymphadenopathy:    He has no cervical adenopathy.  Skin: Skin is warm and dry. No rash noted.       Assessment & Plan:

## 2012-03-31 NOTE — Addendum Note (Signed)
Addended by: Ander Gaster B on: 03/31/2012 12:37 PM   Modules accepted: Orders

## 2012-04-10 ENCOUNTER — Ambulatory Visit: Payer: 59 | Admitting: Internal Medicine

## 2012-04-10 DIAGNOSIS — Z0289 Encounter for other administrative examinations: Secondary | ICD-10-CM

## 2012-04-20 ENCOUNTER — Ambulatory Visit (INDEPENDENT_AMBULATORY_CARE_PROVIDER_SITE_OTHER): Payer: 59 | Admitting: Internal Medicine

## 2012-04-20 ENCOUNTER — Encounter: Payer: Self-pay | Admitting: Internal Medicine

## 2012-04-20 VITALS — BP 122/66 | HR 71 | Temp 98.5°F | Wt 322.0 lb

## 2012-04-20 DIAGNOSIS — E785 Hyperlipidemia, unspecified: Secondary | ICD-10-CM

## 2012-04-20 DIAGNOSIS — F4321 Adjustment disorder with depressed mood: Secondary | ICD-10-CM

## 2012-04-20 DIAGNOSIS — R059 Cough, unspecified: Secondary | ICD-10-CM

## 2012-04-20 DIAGNOSIS — E1129 Type 2 diabetes mellitus with other diabetic kidney complication: Secondary | ICD-10-CM

## 2012-04-20 DIAGNOSIS — I10 Essential (primary) hypertension: Secondary | ICD-10-CM

## 2012-04-20 DIAGNOSIS — R05 Cough: Secondary | ICD-10-CM

## 2012-04-20 DIAGNOSIS — E1165 Type 2 diabetes mellitus with hyperglycemia: Secondary | ICD-10-CM

## 2012-04-20 NOTE — Assessment & Plan Note (Signed)
Still with cough ?persistent sinus infection If not improving, would try course of levaquin

## 2012-04-20 NOTE — Assessment & Plan Note (Signed)
Lab Results  Component Value Date   HGBA1C 9.3* 01/29/2012   This is better but still not at goal Should consider endocrine consult---he will check his insurance

## 2012-04-20 NOTE — Assessment & Plan Note (Signed)
BP Readings from Last 3 Encounters:  04/20/12 122/66  03/31/12 136/84  03/03/12 131/99   Good control  No changes needed

## 2012-04-20 NOTE — Assessment & Plan Note (Signed)
Seems to be better ?side effects with med---he will check on what he is on Probably needs to talk to [facility] doctors (transplant team)

## 2012-04-20 NOTE — Assessment & Plan Note (Signed)
Will check labs No recent check

## 2012-04-20 NOTE — Progress Notes (Signed)
Subjective:    Patient ID: [name], male    DOB: [birth year], 52 y.o.   MRN: [id number]  HPI Still fighting infection Finished the augmentin but still coughing up some phlegm No fever but feels hot then cold Not SOB Feels better but not well yet Decreased energy Voice is off at times Lots of drainage Ears are clogged  Sugars running pretty high in past 2 weeks 180 fasting, 220 post prandial Last A1c was 9.3% in [facility] No recent insulin adjustments  Transplant doctors want him to get yearly check up with dermatologist He is going to check his network  No chest pain No SOB Trying to walk---not with the cold weather Went to the gym at the hospital but now can't afford the $45 a month  Started on antidepressant at [facility] remember the name---may be bupropion Feels it is helping but he is in "a daze" since being on it  Current Outpatient Prescriptions on File Prior to Visit  Medication Sig Dispense Refill  . furosemide (LASIX) 80 MG tablet Take 80 mg by mouth 2 (two) times daily.       Marland Kitchen glipiZIDE (GLUCOTROL) 10 MG tablet Take 1 tablet (10 mg total) by mouth 2 (two) times daily before a meal.  60 tablet  11  . glucose blood (BAYER CONTOUR NEXT TEST) test strip Check blood glucose three times daily and as directed.  100 each  11  . insulin aspart (NOVOLOG) 100 UNIT/ML injection Inject 20 Units into the skin 3 (three) times daily before meals.  2 vial  11  . insulin glargine (LANTUS) 100 UNIT/ML injection Inject 110 Units into the skin at bedtime.      . Insulin Pen Needle 31G X 8 MM MISC Inject as directed 4 times daily as needed  400 each  3  . metolazone (ZAROXOLYN) 2.5 MG tablet Take 2.5 mg by mouth daily.        . mometasone (NASONEX) 50 MCG/ACT nasal spray Place 2 sprays into the nose daily.  17 g  11  . mycophenolate (MYFORTIC) 180 MG EC tablet Take 720 mg by mouth 2 (two) times daily.       Marland Kitchen  omeprazole (PRILOSEC) 20 MG capsule Take 1 capsule (20 mg total) by mouth daily.  30 capsule  11  . potassium chloride (KLOR-CON) 20 MEQ packet Take 60 mEq by mouth 3 (three) times daily.       . tacrolimus (PROGRAF) 1 MG capsule Take 1 mg by mouth 2 (two) times daily.         Allergies  Allergen Reactions  . Morphine     MORPHINE, puritis    Past Medical History  Diagnosis Date  . Diabetes mellitus   . CAD (coronary artery disease) 2005    had CABG  . Hypertension   . Hyperlipidemia     low since renal failure  . ESRD (end stage renal disease) 2005    Dialysis then renal transplant  . Kidney transplant status, cadaveric 2012  . Depression   . GERD (gastroesophageal reflux disease)     Past Surgical History  Procedure Date  . Kidney transplant 09/13/2010    cadaver--at [facility]  . Coronary artery bypass graft 2005  . Tympanic membrane repair 1/12    left  . Dg angio av shunt*l*     right and left upper arms  . Fasciotomy 03/03/2012    Procedure: FASCIOTOMY;  Surgeon: [name], MD;  Location: [place];  Service: Orthopedics;  Laterality: Right;  FASCIOTOMY RIGHT SMALL FINGER    Family History  Problem Relation Age of Onset  . Heart disease Father   . Diabetes Maternal Grandmother     History   Social History  . Marital Status: Married    Spouse Name: N/A    Number of Children: 2  . Years of Education: N/A   Occupational History  . 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 Smoking status: Never Smoker   . Smokeless tobacco: Never Used  . Alcohol Use: No  . Drug Use: No  . Sexually Active: Not on file   Other Topics Concern  . Not on file   Social History Narrative   Has living willWife is health care POAWould accept resuscitation attemptsHasn't considered tube feedings   Review of Systems Appetite is off somewhat---nothing appeals to him Weight stable lately--has some extra fluid  though Sleep is not great---had sleep study through [place]. Hasn't gotten the results    Objective:   Physical Exam  Constitutional: He appears well-developed. No distress.  Neck: Normal range of motion. Neck supple.  Cardiovascular: Normal rate, regular rhythm, normal heart sounds and intact distal pulses.  Exam reveals no gallop.   No murmur heard. Pulmonary/Chest: Effort normal and breath sounds normal. No respiratory distress. He has no wheezes. He has no rales.  Musculoskeletal: He exhibits no edema and no tenderness.  Lymphadenopathy:    He has no cervical adenopathy.  Neurological:       Normal sensation on plantar feet  Skin:       No foot lesions  Psychiatric: He has a normal mood and affect. His behavior is normal.          Assessment & Plan:

## 2012-04-21 LAB — LIPID PANEL
Total CHOL/HDL Ratio: 7
Triglycerides: 655 mg/dL — ABNORMAL HIGH (ref 0.0–149.0)

## 2012-04-21 LAB — LDL CHOLESTEROL, DIRECT: Direct LDL: 41.9 mg/dL

## 2012-04-22 ENCOUNTER — Telehealth: Payer: Self-pay

## 2012-04-22 DIAGNOSIS — E1129 Type 2 diabetes mellitus with other diabetic kidney complication: Secondary | ICD-10-CM

## 2012-04-22 DIAGNOSIS — E1165 Type 2 diabetes mellitus with hyperglycemia: Secondary | ICD-10-CM

## 2012-04-22 MED ORDER — AMOXICILLIN-POT CLAVULANATE 875-125 MG PO TABS: 1.0000 | ORAL_TABLET | Freq: Two times a day (BID) | ORAL | Status: DC

## 2012-04-22 NOTE — Telephone Encounter (Signed)
Discussed with patient [name] put in for consult which I had recommended  [name] treat with augmentin again as the levaquin may interact with the tacrolimus

## 2012-04-22 NOTE — Telephone Encounter (Signed)
Pt seen 04/20/12;  Productive cough with yellow phlegm is no better and wants antibiotic Dr Silvio Pate mentioned to [name] outpatient pharmacy.Please advise. Pt also request referral to Dr Gabriel Carina endocrinologist at [facility] discussed referral with Dr Brooke Bonito, pt was checking and Dr Gabriel Carina is in pt's network.

## 2012-05-09 ENCOUNTER — Observation Stay: Payer: Self-pay | Admitting: Specialist

## 2012-05-09 DIAGNOSIS — R079 Chest pain, unspecified: Secondary | ICD-10-CM

## 2012-05-09 LAB — COMPREHENSIVE METABOLIC PANEL
Anion Gap: 3 — ABNORMAL LOW (ref 7–16)
BUN: 17 mg/dL (ref 7–18)
Calcium, Total: 8.5 mg/dL (ref 8.5–10.1)
Chloride: 98 mmol/L (ref 98–107)
Creatinine: 1.2 mg/dL (ref 0.60–1.30)
EGFR (Non-African Amer.): >60
Glucose: 129 mg/dL — ABNORMAL HIGH (ref 65–99)
Osmolality: 279 (ref 275–301)
Sodium: 138 mmol/L (ref 136–145)
Total Protein: 6.7 g/dL (ref 6.4–8.2)

## 2012-05-09 LAB — CK TOTAL AND CKMB (NOT AT ARMC): CK-MB: 3.2 ng/mL (ref 0.5–3.6)

## 2012-05-09 LAB — CBC
HCT: 44.4 % (ref 40.0–52.0)
HGB: 14.4 g/dL (ref 13.0–18.0)
MCH: 23.8 pg — ABNORMAL LOW (ref 26.0–34.0)
MCV: 73 fL — ABNORMAL LOW (ref 80–100)
Platelet: 177 10*3/uL (ref 150–440)
RBC: 6.07 10*6/uL — ABNORMAL HIGH (ref 4.40–5.90)

## 2012-05-09 LAB — PRO B NATRIURETIC PEPTIDE: B-Type Natriuretic Peptide: 724 pg/mL — ABNORMAL HIGH (ref 0–125)

## 2012-05-09 LAB — TROPONIN I: Troponin-I: 0.04 ng/mL

## 2012-05-09 LAB — PROTIME-INR: Prothrombin Time: 14 secs (ref 11.5–14.7)

## 2012-05-09 LAB — CK-MB: CK-MB: 2.7 ng/mL (ref 0.5–3.6)

## 2012-05-09 LAB — APTT: Activated PTT: 31 secs (ref 23.6–35.9)

## 2012-05-10 LAB — CK-MB: CK-MB: 2.2 ng/mL (ref 0.5–3.6)

## 2012-05-10 LAB — MAGNESIUM: Magnesium: 1.5 mg/dL — ABNORMAL LOW

## 2012-05-10 LAB — POTASSIUM: Potassium: 3 mmol/L — ABNORMAL LOW (ref 3.5–5.1)

## 2012-05-11 ENCOUNTER — Telehealth: Payer: Self-pay

## 2012-05-11 NOTE — Telephone Encounter (Signed)
Message copied by [facility], Darcella Shiffman E on Mon May 11, 2012  3:04 PM ------      Message from: [name] A      Created: Mon May 11, 2012  2:59 PM       I saw him over the weekend at [facility] for chest pain.       Schedule him for a Lexiscan myoview at [facility] later this week or next week. It might need to be a 2 day study due to weight above 300 lbs.  ------

## 2012-05-12 NOTE — Telephone Encounter (Signed)
Pt willing to schedule lexi at [facility] next week I will call to arrange and will call him back with details Ht=5'11", wt=315 pounds

## 2012-05-12 NOTE — Telephone Encounter (Signed)
Verbal instructions given to pt re:2 day lexi Understanding verb

## 2012-05-12 NOTE — Telephone Encounter (Signed)
Scheduled for 2/24 and 2/25 for [name]

## 2012-05-15 ENCOUNTER — Other Ambulatory Visit: Payer: Self-pay

## 2012-05-15 DIAGNOSIS — R079 Chest pain, unspecified: Secondary | ICD-10-CM

## 2012-05-18 ENCOUNTER — Ambulatory Visit: Payer: Self-pay | Admitting: Cardiovascular Disease

## 2012-05-18 DIAGNOSIS — R079 Chest pain, unspecified: Secondary | ICD-10-CM

## 2012-05-19 ENCOUNTER — Other Ambulatory Visit: Payer: Self-pay | Admitting: *Deleted

## 2012-05-19 ENCOUNTER — Telehealth: Payer: Self-pay

## 2012-05-19 DIAGNOSIS — R079 Chest pain, unspecified: Secondary | ICD-10-CM

## 2012-05-19 NOTE — Telephone Encounter (Signed)
"  call pt to tell him his 1st part of stress test was normal and does not need to come for 2nd half" VO Dr. Mills Koller, RN Pt informed Understanding verb

## 2012-06-12 ENCOUNTER — Telehealth: Payer: Self-pay | Admitting: *Deleted

## 2012-06-12 NOTE — Telephone Encounter (Signed)
Left message asking pt to return my call, pt sent in disability forms to be filled out and per Dr.Letvak pt needs an office visit to have these filled out.

## 2012-06-12 NOTE — Telephone Encounter (Signed)
Patient called back and scheduled appt

## 2012-06-18 ENCOUNTER — Ambulatory Visit: Payer: 59 | Admitting: Internal Medicine

## 2012-06-22 ENCOUNTER — Ambulatory Visit: Payer: 59 | Admitting: Internal Medicine

## 2012-06-26 ENCOUNTER — Encounter: Payer: Self-pay | Admitting: Internal Medicine

## 2012-06-26 ENCOUNTER — Ambulatory Visit (INDEPENDENT_AMBULATORY_CARE_PROVIDER_SITE_OTHER): Payer: 59 | Admitting: Internal Medicine

## 2012-06-26 VITALS — BP 160/80 | HR 76 | Temp 97.7°F | Ht 70.0 in | Wt 329.0 lb

## 2012-06-26 DIAGNOSIS — R413 Other amnesia: Secondary | ICD-10-CM

## 2012-06-26 DIAGNOSIS — G3184 Mild cognitive impairment, so stated: Secondary | ICD-10-CM | POA: Insufficient documentation

## 2012-06-26 DIAGNOSIS — F4321 Adjustment disorder with depressed mood: Secondary | ICD-10-CM

## 2012-06-26 NOTE — Assessment & Plan Note (Signed)
May be related to his past dialysis Also, he continues to have emotional issues with survivor guilt, etc Will set up with [facility] for more formal neuropsych testing May benefit from counseling as well

## 2012-06-26 NOTE — Progress Notes (Signed)
Subjective:    Patient ID: [name], male    DOB: 1960/08/14, 52 y.o.   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 furosemide (LASIX) 80 MG tablet Take 80 mg by mouth 2 (two) times daily.       Marland Kitchen glipiZIDE (GLUCOTROL) 10 MG tablet Take 1 tablet (10 mg total) by mouth 2 (two) times daily before a meal.  60 tablet  11  . glucose blood (BAYER CONTOUR NEXT TEST) test strip Check blood glucose three times daily and as directed.  100 each  11  . insulin glargine (LANTUS) 100 UNIT/ML injection Inject 130 Units into the skin at bedtime.       . Insulin Pen Needle 31G X 8 MM MISC Inject as directed 4 times daily as needed  400 each  3  . metolazone (ZAROXOLYN) 2.5 MG tablet Take 2.5 mg by mouth daily.        . mometasone (NASONEX) 50 MCG/ACT nasal spray Place 2 sprays into the nose daily.  17 g  11  . mycophenolate (MYFORTIC) 180 MG EC tablet Take 720 mg by mouth 2 (two) times daily.       Marland Kitchen omeprazole (PRILOSEC) 20 MG capsule Take 1 capsule (20 mg total) by mouth daily.  30 capsule  11  . potassium chloride (KLOR-CON) 20 MEQ packet Take 60 mEq by mouth 3 (three) times daily.       . tacrolimus (PROGRAF) 1 MG capsule Take 1 mg by mouth 2 (two) times daily.        No current facility-administered medications on file prior to visit.    Allergies  Allergen Reactions  . Morphine     MORPHINE, puritis    Past Medical History  Diagnosis Date  . Diabetes mellitus   . CAD (coronary artery disease) 2005    had CABG  . Hypertension   .  Hyperlipidemia     low since renal failure  . ESRD (end stage renal disease) 2005    Dialysis then renal transplant  . Kidney transplant status, cadaveric 2012  . Depression   . GERD (gastroesophageal reflux disease)     Past Surgical History  Procedure Laterality Date  . Kidney transplant  09/13/2010    cadaver--at [facility]  . Coronary artery bypass graft  2005  . Tympanic membrane repair  1/12    left  . Dg angio av shunt*l*      right and left upper arms  . Fasciotomy  03/03/2012    Procedure: FASCIOTOMY;  Surgeon: [name], MD;  Location: [place];  Service: Orthopedics;  Laterality: Right;  FASCIOTOMY RIGHT SMALL FINGER    Family History  Problem Relation Age of Onset  . Heart disease Father   . Diabetes Maternal Grandmother     History   Social History  . Marital Status: Married    Spouse Name: N/A    Number of Children: 2  . Years of Education: N/A   Occupational History  . 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 Smoking status: Never Smoker   . Smokeless tobacco: Never Used  . Alcohol Use: No  . Drug Use: No  . Sexually Active: Not on file   Other Topics Concern  . Not on file   Social History Narrative   Has living will   Wife is health care POA   Would accept resuscitation attempts   Hasn't considered tube feedings   Review of Systems Trazodone in the past for sleep--made him very foggy so he stopped it Appetite is not great---"I have to force myself to eat"    Objective:   Physical Exam  Constitutional: He is oriented to person, place, and time.  Neurological: He is alert and oriented to person, place, and time.  April 2nd or 3rd--then corrected President-- "Obama, ?" [phone]? [id number]  "no that isn't right" Recall 2/3 Clock draw incomplete          Assessment & Plan:

## 2012-07-02 ENCOUNTER — Other Ambulatory Visit: Payer: Self-pay | Admitting: *Deleted

## 2012-07-02 MED ORDER — GLIPIZIDE 10 MG PO TABS: 10.0000 mg | ORAL_TABLET | Freq: Two times a day (BID) | ORAL | Status: DC

## 2012-07-21 ENCOUNTER — Ambulatory Visit (INDEPENDENT_AMBULATORY_CARE_PROVIDER_SITE_OTHER): Payer: 59 | Admitting: Psychology

## 2012-07-21 DIAGNOSIS — F331 Major depressive disorder, recurrent, moderate: Secondary | ICD-10-CM

## 2012-08-06 LAB — HEMOGLOBIN A1C: Hgb A1c MFr Bld: 8.1 % — AB (ref 4.0–6.0)

## 2012-08-25 ENCOUNTER — Encounter: Payer: Self-pay | Admitting: Internal Medicine

## 2012-08-25 ENCOUNTER — Ambulatory Visit: Payer: 59 | Admitting: Psychology

## 2012-08-31 ENCOUNTER — Encounter: Payer: Self-pay | Admitting: Internal Medicine

## 2012-09-09 DIAGNOSIS — R42 Dizziness and giddiness: Secondary | ICD-10-CM | POA: Insufficient documentation

## 2012-09-11 ENCOUNTER — Ambulatory Visit (INDEPENDENT_AMBULATORY_CARE_PROVIDER_SITE_OTHER): Payer: 59 | Admitting: Family Medicine

## 2012-09-11 ENCOUNTER — Encounter: Payer: Self-pay | Admitting: Family Medicine

## 2012-09-11 VITALS — HR 84 | Temp 98.0°F | Wt 330.2 lb

## 2012-09-11 DIAGNOSIS — M109 Gout, unspecified: Secondary | ICD-10-CM

## 2012-09-11 LAB — URIC ACID: Uric Acid, Serum: 15.8 mg/dL — ABNORMAL HIGH (ref 4.0–7.8)

## 2012-09-11 MED ORDER — OXYCODONE HCL 5 MG PO TABS: 5.0000 mg | ORAL_TABLET | Freq: Four times a day (QID) | ORAL | Status: DC | PRN

## 2012-09-11 MED ORDER — COLCHICINE 0.6 MG PO TABS: 0.6000 mg | ORAL_TABLET | Freq: Every day | ORAL | Status: DC | PRN

## 2012-09-11 NOTE — Progress Notes (Signed)
BP was 130s/60s yesterday.  We didn't check today due to L arm graft and R elbow pain.  A1c yesterday 7.1.    L elbow pain, with any movement.  No fevers.  No h/o gout.  Pain at the olecranon. No h/o similar.  No trauma.  Pain started yesterday AM.  H/o renal transplant.  Recent Cr reviewed.    Meds, vitals, and allergies reviewed.   ROS: See HPI.  Otherwise, noncontributory.  nad ncat Mmm rrr ctab abd soft R elbow red, diffusely ttp on the olecranon, even with light tough.  ROM limited by pain.  Distally NV intact

## 2012-09-11 NOTE — Patient Instructions (Addendum)
Go to the lab on the way out.  We'll contact you with your lab report.  Use the oxycodone for pain and take the colchicine once a day.  Limit both as much as possible.  Take care.

## 2012-09-13 DIAGNOSIS — M109 Gout, unspecified: Secondary | ICD-10-CM | POA: Insufficient documentation

## 2012-09-13 NOTE — Assessment & Plan Note (Signed)
Likely, see notes on labs.  Colchicine and oxycodone in meantime, avoid nsaids.  F/u with pcp prn. He agrees.  This doesn't appear traumatic, infectious.  Okay for outpatient f/u.

## 2012-09-18 ENCOUNTER — Encounter: Payer: Self-pay | Admitting: Radiology

## 2012-09-21 ENCOUNTER — Ambulatory Visit (INDEPENDENT_AMBULATORY_CARE_PROVIDER_SITE_OTHER): Payer: 59 | Admitting: Internal Medicine

## 2012-09-21 ENCOUNTER — Encounter: Payer: Self-pay | Admitting: Internal Medicine

## 2012-09-21 VITALS — BP 150/80 | HR 75 | Temp 97.9°F | Wt 333.0 lb

## 2012-09-21 DIAGNOSIS — M109 Gout, unspecified: Secondary | ICD-10-CM

## 2012-09-21 NOTE — Progress Notes (Signed)
Subjective:    Patient ID: [name], male    DOB: [birth year], 52 y.o.   MRN: [id number]  HPI Much better Elbow was better within 2 days Only took 1 colchicine a day for 2 days and it was better  Swelling is gone Able to move it normally Remembers having shrimp right before the pain started  Frustrated by his weight Walking 2 miles per day Holding onto fluid in abdomen Has tried to avoid the furosemide---but has some to take Considering gastric banding---suggested he needs to discuss with his transplant team first  Current Outpatient Prescriptions on File Prior to Visit  Medication Sig Dispense Refill  . aspirin 81 MG tablet Take 81 mg by mouth daily.      Marland Kitchen buPROPion (WELLBUTRIN XL) 150 MG 24 hr tablet Take 150 mg by mouth daily.      . colchicine 0.6 MG tablet Take 1 tablet (0.6 mg total) by mouth daily as needed (gout).  15 tablet  0  . furosemide (LASIX) 80 MG tablet Take 80 mg by mouth 2 (two) times daily.       Marland Kitchen glipiZIDE (GLUCOTROL) 10 MG tablet Take 1 tablet (10 mg total) by mouth 2 (two) times daily before a meal.  180 tablet  0  . glucose blood (BAYER CONTOUR NEXT TEST) test strip Check blood glucose three times daily and as directed.  100 each  11  . insulin aspart (NOVOLOG) 100 UNIT/ML injection Inject 30 Units into the skin 3 (three) times daily before meals.      . insulin glargine (LANTUS) 100 UNIT/ML injection Inject 130 Units into the skin at bedtime.       . Insulin Pen Needle 31G X 8 MM MISC Inject as directed 4 times daily as needed  400 each  3  . labetalol (NORMODYNE) 300 MG tablet Take 150-300 mg by mouth 2 (two) times daily.       . metolazone (ZAROXOLYN) 2.5 MG tablet Take 2.5 mg by mouth daily.        . mometasone (NASONEX) 50 MCG/ACT nasal spray Place 2 sprays into the nose daily.  17 g  11  . mycophenolate (MYFORTIC) 180 MG EC tablet Take 720 mg by mouth 2 (two) times daily.       Marland Kitchen omeprazole (PRILOSEC) 20 MG capsule Take 1 capsule (20 mg total) by  mouth daily.  30 capsule  11  . oxyCODONE (ROXICODONE) 5 MG immediate release tablet Take 1 tablet (5 mg total) by mouth every 6 (six) hours as needed for pain.  30 tablet  0  . potassium chloride (KLOR-CON) 20 MEQ packet Take 60 mEq by mouth 3 (three) times daily.       . tacrolimus (PROGRAF) 1 MG capsule Take 1 mg by mouth 2 (two) times daily.        No current facility-administered medications on file prior to visit.    Allergies  Allergen Reactions  . Morphine     MORPHINE, puritis    Past Medical History  Diagnosis Date  . Diabetes mellitus   . CAD (coronary artery disease) 2005    had CABG  . Hypertension   . Hyperlipidemia     low since renal failure  . ESRD (end stage renal disease) 2005    Dialysis then renal transplant  . Kidney transplant status, cadaveric 2012  . Depression   . GERD (gastroesophageal reflux disease)     Past Surgical History  Procedure Laterality Date  .  Kidney transplant  09/13/2010    cadaver--at [facility]  . Coronary artery bypass graft  2005  . Tympanic membrane repair  1/12    left  . Dg angio av shunt*l*      right and left upper arms  . Fasciotomy  03/03/2012    Procedure: FASCIOTOMY;  Surgeon: [name], MD;  Location: [place];  Service: Orthopedics;  Laterality: Right;  FASCIOTOMY RIGHT SMALL FINGER    Family History  Problem Relation Age of Onset  . Heart disease Father   . Diabetes Maternal Grandmother     History   Social History  . Marital Status: Married    Spouse Name: N/A    Number of Children: 2  . Years of Education: N/A   Occupational History  . 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 Smoking status: Never Smoker   . Smokeless tobacco: Never Used  . Alcohol Use: No  . Drug Use: No  . Sexually Active: Not on file   Other Topics Concern  . Not on file   Social History Narrative   Has living will   Wife is health care POA   Would  accept resuscitation attempts   Hasn't considered tube feedings   Review of Systems No nausea or vomiting with the med Appetite is fine     Objective:   Physical Exam  Constitutional: He appears well-developed and well-nourished. No distress.  Musculoskeletal:  Right elbow with no swelling, inflammation or tenderness No redness          Assessment & Plan:

## 2012-09-21 NOTE — Assessment & Plan Note (Signed)
First manifestation Uric acid over 15 Had striking quick response with colchicine  Discussed alternatives Will just use abortive Rx with colchicine if he has any recurrences If frequent, will consider allopurinol

## 2012-09-21 NOTE — Patient Instructions (Signed)
Please set up a follow up appt with Dr Rexene Edison

## 2012-09-22 LAB — HEMOGLOBIN A1C: Hgb A1c MFr Bld: 7.7 % — AB (ref 4.0–6.0)

## 2012-09-24 ENCOUNTER — Ambulatory Visit (INDEPENDENT_AMBULATORY_CARE_PROVIDER_SITE_OTHER)
Admission: RE | Admit: 2012-09-24 | Discharge: 2012-09-24 | Disposition: A | Discharge status: Home / Self Care | Payer: 59 | Source: Ambulatory Visit | Attending: Family Medicine | Admitting: Family Medicine

## 2012-09-24 ENCOUNTER — Ambulatory Visit (INDEPENDENT_AMBULATORY_CARE_PROVIDER_SITE_OTHER): Payer: 59 | Admitting: Family Medicine

## 2012-09-24 ENCOUNTER — Encounter: Payer: Self-pay | Admitting: *Deleted

## 2012-09-24 ENCOUNTER — Encounter: Payer: Self-pay | Admitting: Family Medicine

## 2012-09-24 VITALS — BP 148/100 | HR 76 | Temp 98.0°F | Wt 332.8 lb

## 2012-09-24 DIAGNOSIS — R0902 Hypoxemia: Secondary | ICD-10-CM | POA: Insufficient documentation

## 2012-09-24 DIAGNOSIS — G4734 Idiopathic sleep related nonobstructive alveolar hypoventilation: Secondary | ICD-10-CM | POA: Insufficient documentation

## 2012-09-24 DIAGNOSIS — R059 Cough, unspecified: Secondary | ICD-10-CM

## 2012-09-24 DIAGNOSIS — R05 Cough: Secondary | ICD-10-CM

## 2012-09-24 NOTE — Progress Notes (Addendum)
Pt seen and examined with PA student, Marlyce Huge.  Subjective:    Patient ID: [name], male    DOB: 1960-07-23, 52 y.o.   MRN: [id number]  HPI CC: cough  O2 sat 94-95% on 2L [place].  Drops to 80's off oxygen.   Nonproductive cough for last 10 days as well as rhinorrhea with clear nasal discharge.  Yesterday noticed increasing dyspnea on exertion and dyspnea at night.  O2 sat down to 75% last night - did improve after a coughing fit.  Notices dizziness, joint aches, and lack of energy.  Mild HA present as well from cough.  Some orthopnea.  Has noticed more trouble breathing with warmer humid weather as well recently.  Denies fevers/chills, ear pain, tooth pain, nausea/vomiting, diarrhea, swollen glands.  No abd pain, chest pain, new rashes. Denies leg swelling.  Takes nasonex regularly for seasonal allergies.  Has not tried anything else for this. Son recently ill. Denies smoking hx.  No h/o COPD.  No h/o asthma. Ho PNA in past.  Seeing ophthalmologist for L eye conjuctivitis.  Transplant doctor is Dr. Alford Highland at [facility].  H/o kidney transplant 08/2010 - on cellcept and prograf, h/o ESRD prior, DM, HTN, CAD s/p CABG, known cardiomegaly, HLD.  Past Medical History  Diagnosis Date  . Diabetes mellitus   . CAD (coronary artery disease) 2005    had CABG  . Hypertension   . Hyperlipidemia     low since renal failure  . ESRD (end stage renal disease) 2005    Dialysis then renal transplant  . Kidney transplant status, cadaveric 2012  . Depression   . GERD (gastroesophageal reflux disease)      Review of Systems Per HPI    Objective:   Physical Exam  Nursing note and vitals reviewed. Constitutional: He appears well-developed and well-nourished. No distress.  HENT:  Head: Normocephalic and atraumatic.  Right Ear: Hearing, tympanic membrane, external ear and ear canal normal.  Left Ear: Hearing, tympanic membrane, external ear and ear canal normal.  Nose: Nose normal. No  mucosal edema or rhinorrhea. Right sinus exhibits no maxillary sinus tenderness and no frontal sinus tenderness. Left sinus exhibits no maxillary sinus tenderness and no frontal sinus tenderness.  Mouth/Throat: Uvula is midline, oropharynx is clear and moist and mucous membranes are normal. No oropharyngeal exudate, posterior oropharyngeal edema, posterior oropharyngeal erythema or tonsillar abscesses.  Crusted nasal mucosa  Eyes: Conjunctivae and EOM are normal. Pupils are equal, round, and reactive to light. No scleral icterus.  Neck: Normal range of motion. Neck supple.  Cardiovascular: Normal rate, regular rhythm, normal heart sounds and intact distal pulses.   No murmur heard. Pulmonary/Chest: Effort normal. No respiratory distress. He has decreased breath sounds (left sided). He has no wheezes. He has rhonchi (bibasilarly). He has no rales.  Distant breath sounds 2/2 body habitus  Lymphadenopathy:    He has no cervical adenopathy.  Skin: Skin is warm and dry. No rash noted.       Assessment & Plan:

## 2012-09-24 NOTE — Progress Notes (Addendum)
Subjective:    Patient ID: [name], male    DOB: 05/13/1960, 52 y.o.   MRN: [id number]   Cough Associated symptoms include rhinorrhea and shortness of breath. Pertinent negatives include no chest pain, chills, ear pain or fever. His past medical history is significant for environmental allergies.   Patient presents with nonproductive cough that started about a week and a half ago.  Has also noticed rhinorrhea with clear nasal discharge that started around the same time.  Yesterday he noticed that he had increasing shortness of breath when walking around the mall, very unusual for him.  Had trouble sleeping last night due to inability to catch his breath.  02 sats dropped down to 75-85%.  Noticed that sats improve after coughing.  Has also experienced some joint aches and lack of energy.  Feels faint and develops headaches when he has "coughing spells".  Hasn't tried anything for his symptoms yet.  Takes Nasonex every evening for seasonal allergies.  Originally thought his symptoms may be due to allergies, but his regular medication has provided no relief.  Only sick contact is his son who was ill about 2-3 weeks ago.  Denies history of tobacco use and COPD.  Has been treated for pneumonia in the past.  Has not experienced changes like this to his O2 sats before.  Feels that he has been doing well since his kidney transplant a few years ago.     Review of Systems  Constitutional: Positive for activity change and fatigue. Negative for fever, chills and appetite change.  HENT: Positive for rhinorrhea. Negative for hearing loss, ear pain, congestion and sinus pressure.   Respiratory: Positive for cough and shortness of breath. Negative for chest tightness.   Cardiovascular: Negative for chest pain.  Gastrointestinal: Positive for constipation. Negative for nausea, vomiting and diarrhea.  Allergic/Immunologic: Positive for environmental allergies and immunocompromised state (post-transplant).   Neurological: Positive for light-headedness (with coughing fits).  Hematological: Negative for adenopathy.       Objective:   Physical Exam  Constitutional: He appears well-developed and well-nourished. No distress.  HENT:  Right Ear: Tympanic membrane and external ear normal.  Left Ear: Tympanic membrane and external ear normal.  Nose: Nose normal. Right sinus exhibits no maxillary sinus tenderness and no frontal sinus tenderness. Left sinus exhibits no maxillary sinus tenderness and no frontal sinus tenderness.  Mouth/Throat: Uvula is midline, oropharynx is clear and moist and mucous membranes are normal. No oropharyngeal exudate.  Eyes: Pupils are equal, round, and reactive to light.  Cardiovascular: Normal rate, regular rhythm and normal heart sounds.   No murmur heard. Pulmonary/Chest: Effort normal and breath sounds normal. No respiratory distress. He has no wheezes. He has no rales. He exhibits no tenderness.  02 sats in office between 95-98% on 2L oxygen  Lymphadenopathy:    He has no cervical adenopathy.  Skin: Skin is warm and dry. No rash noted. He is not diaphoretic. No pallor.          Assessment & Plan:  Opportunistic infection (systemic or pneumonia) vs bronchitis vs URI -cough and duration of symptoms consistent with bronchitis, but lack of sputum production and drop in 02 sats suggests a different cause -URI not likely to cause hypoxemia, patient should be feeling better after 10 days -due to immunocompromrised state, patient is more likely to have an opportunistic infection -history of recurrent pneumonia, and drop in 02 sats, supports pneumonia -suggest further evaluation at [facility] where patient is currently being monitored  post kidney transplant  -continue close monitoring of 02 needs, 02 sats above 95% ideal -consider empiric antibiotic treatment with ceftriaxone or azithromycin

## 2012-09-24 NOTE — Assessment & Plan Note (Addendum)
Nonproductive cough with hypoxia, in this 52 yo with multiple comorbidities including immunocompromised status (cellcept and prograf). First concern is community acquired pneumonia but CXR unrevealing for this.  Concerning for possible opportunistic infection. Not consistent with CHF today - no crackles, no leg swelling. I spoke with Dr. Royce Macadamia at [facility] and they recommend further evaluation today at [facility].  Will need further blood work and possibly CT/bronch.  Will send to ER - they are expecting him. I did not send via ambulance today - but via wife's care.  Lowest documented O2 sat in office was 87%, but lowest O2 sat last night was 75%.  Maintaining mid 90's on 2L.  Will send off oxygen while he arrives to [facility] ER. Pt agrees with plan.

## 2012-09-24 NOTE — Patient Instructions (Addendum)
To [place] spoken with Dr. Royce Macadamia and they will be expecting you.

## 2012-09-24 NOTE — Telephone Encounter (Signed)
Opened in error

## 2012-09-28 ENCOUNTER — Telehealth: Payer: Self-pay

## 2012-09-28 DIAGNOSIS — G4733 Obstructive sleep apnea (adult) (pediatric): Secondary | ICD-10-CM

## 2012-09-28 NOTE — Telephone Encounter (Signed)
Pt was hospitalized at [facility] over weekend; pt was told CO2 was too high and needs urgent order for CPAP. Pt is on continuous O2 at 2L. Pt request Dr Silvio Pate to order c pap. Pt is also supposed to flu after hospitalization. Pt said sleep study done at [facility]. Spoke with Janett Billow at [place] and Dr Mliss Sax ordered sleep study. Pt said Dr Quentin Cornwall was nephrologist at [facility] and he is no longer there. Requested copy of sleep study faxed to Dr Silvio Pate. Pt wants to know if he should f/u with Dr Silvio Pate or should he see a pulmonologist.Please advise.

## 2012-09-28 NOTE — Telephone Encounter (Signed)
Sleep apnea usually doesn't cause the CO2 to be high He definitely needs to see a pulmonologist/sleep specialist  I can approve the CPAP pending the pulmonary appt if needed I think it is better if he is seen in [place] where are sleep specialists are. Is that okay with him?

## 2012-09-28 NOTE — Telephone Encounter (Signed)
[name], It may be better for you to look into this

## 2012-09-29 DIAGNOSIS — G4733 Obstructive sleep apnea (adult) (pediatric): Secondary | ICD-10-CM

## 2012-09-29 HISTORY — DX: Obstructive sleep apnea (adult) (pediatric): G47.33

## 2012-09-29 NOTE — Telephone Encounter (Signed)
Dr. Silvio Pate pt wants the pulmonogist referral to be in [place] if possible, and also he had a sleep study done 02/2012, I will fax over to [facility] for a copy of that for [facility].

## 2012-09-29 NOTE — Telephone Encounter (Signed)
I wrote an order for a CPAP machine so he can get it ASAP---in case there is a delay in getting an appt

## 2012-09-29 NOTE — Telephone Encounter (Signed)
Copy of sleep study on your desk

## 2012-10-01 ENCOUNTER — Other Ambulatory Visit: Payer: Self-pay

## 2012-10-06 ENCOUNTER — Encounter: Payer: Self-pay | Admitting: Pulmonary Disease

## 2012-10-08 ENCOUNTER — Telehealth: Payer: Self-pay | Admitting: *Deleted

## 2012-10-08 NOTE — Telephone Encounter (Signed)
Message copied by Despina Hidden on Thu Oct 08, 2012 10:57 AM ------      Message from: Gilda Crease      Created: Fri Oct 02, 2012 11:19 AM      Regarding: RE: cpap order       Thank you!      Betsy            ----- Message -----         From: Despina Hidden, CMA         Sent: 10/01/2012   4:43 PM           To: Gilda Crease      Subject: RE: cpap order                                           Faxing now      ----- Message -----         From: Gilda Crease         Sent: 10/01/2012   4:15 PM           To: Despina Hidden, CMA      Subject: RE: cpap order                                           Can the NPI please be written on the Rx and refaxed?  This is an Insurance underwriter requirement.            I will see if we can reach out to the [facility] MD to get the office visit notes needed.      Thank you for your help!      Betsy            ----- Message -----         From: Despina Hidden, CMA         Sent: 10/01/2012   3:42 PM           To: Gilda Crease      Subject: RE: cpap order                                           Dr. Alla German NPI is [id number]      And we didn't order the sleep study, so we don't have notes to support the study done in 03/15/2012. It was ordered by Dr. Meda Klinefelter in [place].            ----- Message -----         From: Gilda Crease         Sent: 10/01/2012   2:48 PM           To: Despina Hidden, CMA      Subject: cpap order                                               We rec'd the order for patient to have a cpap.  There are two more things needed to process order.  Please add the NPI# to the Rx and we need a copy of the office visit note that refers the patient to go have a sleep study.  This would be sometime before 03/15/2012.            Can you please fax these items to [phone] Attn: Dimas Chyle.            Thank you      [place]                         ------

## 2012-10-08 NOTE — Telephone Encounter (Signed)
Michelle with [facility] request order with NPI # and office note prior to sleep study stating sleep study needed. [place] should have info requested.

## 2012-10-12 ENCOUNTER — Other Ambulatory Visit: Payer: Self-pay

## 2012-10-12 LAB — BASIC METABOLIC PANEL
Anion Gap: 0 — ABNORMAL LOW (ref 7–16)
BUN: 30 mg/dL — ABNORMAL HIGH (ref 7–18)
Chloride: 91 mmol/L — ABNORMAL LOW (ref 98–107)
Creatinine: 1.55 mg/dL — ABNORMAL HIGH (ref 0.60–1.30)
Glucose: 251 mg/dL — ABNORMAL HIGH (ref 65–99)
Osmolality: 283 (ref 275–301)
Potassium: 3.1 mmol/L — ABNORMAL LOW (ref 3.5–5.1)
Sodium: 134 mmol/L — ABNORMAL LOW (ref 136–145)

## 2012-10-14 ENCOUNTER — Encounter: Payer: Self-pay | Admitting: Internal Medicine

## 2012-10-19 ENCOUNTER — Encounter: Payer: Self-pay | Admitting: *Deleted

## 2012-10-20 ENCOUNTER — Encounter: Payer: Self-pay | Admitting: Internal Medicine

## 2012-10-20 ENCOUNTER — Ambulatory Visit (INDEPENDENT_AMBULATORY_CARE_PROVIDER_SITE_OTHER): Payer: 59 | Admitting: Internal Medicine

## 2012-10-20 VITALS — BP 160/90 | HR 68 | Temp 97.7°F | Ht 70.0 in | Wt 327.0 lb

## 2012-10-20 DIAGNOSIS — N529 Male erectile dysfunction, unspecified: Secondary | ICD-10-CM

## 2012-10-20 DIAGNOSIS — M109 Gout, unspecified: Secondary | ICD-10-CM

## 2012-10-20 DIAGNOSIS — Z0000 Encounter for general adult medical examination without abnormal findings: Secondary | ICD-10-CM

## 2012-10-20 DIAGNOSIS — E1129 Type 2 diabetes mellitus with other diabetic kidney complication: Secondary | ICD-10-CM

## 2012-10-20 DIAGNOSIS — Z125 Encounter for screening for malignant neoplasm of prostate: Secondary | ICD-10-CM

## 2012-10-20 DIAGNOSIS — Z1211 Encounter for screening for malignant neoplasm of colon: Secondary | ICD-10-CM

## 2012-10-20 DIAGNOSIS — I10 Essential (primary) hypertension: Secondary | ICD-10-CM

## 2012-10-20 LAB — BASIC METABOLIC PANEL
Anion Gap: 4 — ABNORMAL LOW (ref 7–16)
BUN: 25 mg/dL — ABNORMAL HIGH (ref 7–18)
Calcium, Total: 9.7 mg/dL (ref 8.5–10.1)
Chloride: 96 mmol/L — ABNORMAL LOW (ref 98–107)
Co2: 39 mmol/L — ABNORMAL HIGH (ref 21–32)
Creatinine: 1.2 mg/dL (ref 0.60–1.30)
EGFR (Non-African Amer.): >60
Glucose: 79 mg/dL (ref 65–99)
Osmolality: 281 (ref 275–301)
Potassium: 2.9 mmol/L — ABNORMAL LOW (ref 3.5–5.1)
Sodium: 139 mmol/L (ref 136–145)

## 2012-10-20 LAB — HM DIABETES FOOT EXAM

## 2012-10-20 MED ORDER — TADALAFIL 20 MG PO TABS: 10.0000 mg | ORAL_TABLET | ORAL | Status: DC | PRN

## 2012-10-20 MED ORDER — SILDENAFIL CITRATE 100 MG PO TABS: 50.0000 mg | ORAL_TABLET | Freq: Every day | ORAL | Status: DC | PRN

## 2012-10-20 NOTE — Assessment & Plan Note (Signed)
Quiet now on colchicine

## 2012-10-20 NOTE — Assessment & Plan Note (Signed)
Feels some better--?due to CPAP Interested in bariatric surgery--he will pursue at [facility] where his transplant team is  Discussed PSA---will check Fecal immunoassay

## 2012-10-20 NOTE — Progress Notes (Signed)
Subjective:    Patient ID: [name], male    DOB: 1960-08-04, 52 y.o.   MRN: [id number]  HPI Here for physical  Weight is down 5# Still waiting about disability evaluation Is on CPAP now---seems to be helping his sleep. Able to go 6 hours---does have ramp up time which helps Hard to tell if he is more rested in AM--- daytime somnolence is much improved (doesn't need the naps) Gets oxygen PRN during the day  Sees Dr Gabriel Carina for diabetes Last A1c down to 7.7% Diabetes educator still through [facility]  Yearly checks with transplant center Monthly checks with nephrologist   Gout is quiet  Current Outpatient Prescriptions on File Prior to Visit  Medication Sig Dispense Refill  . aspirin 81 MG tablet Take 81 mg by mouth daily.      Marland Kitchen buPROPion (WELLBUTRIN XL) 150 MG 24 hr tablet Take 150 mg by mouth daily.      . colchicine 0.6 MG tablet Take 1 tablet (0.6 mg total) by mouth daily as needed (gout).  15 tablet  0  . glipiZIDE (GLUCOTROL) 10 MG tablet Take 1 tablet (10 mg total) by mouth 2 (two) times daily before a meal.  180 tablet  0  . glucose blood (BAYER CONTOUR NEXT TEST) test strip Check blood glucose three times daily and as directed.  100 each  11  . insulin aspart (NOVOLOG) 100 UNIT/ML injection Inject 30 Units into the skin 3 (three) times daily before meals.      . insulin glargine (LANTUS) 100 UNIT/ML injection Inject 130 Units into the skin at bedtime.       . Insulin Pen Needle 31G X 8 MM MISC Inject as directed 4 times daily as needed  400 each  3  . labetalol (NORMODYNE) 300 MG tablet Take 150-300 mg by mouth 2 (two) times daily.       . metolazone (ZAROXOLYN) 2.5 MG tablet Take 2.5 mg by mouth daily.        . mometasone (NASONEX) 50 MCG/ACT nasal spray Place 2 sprays into the nose daily.  17 g  11  . mycophenolate (MYFORTIC) 180 MG EC tablet Take 720 mg by mouth 2 (two) times daily.       Marland Kitchen omeprazole (PRILOSEC) 20 MG capsule Take 1 capsule (20 mg total) by mouth daily.   30 capsule  11  . potassium chloride (KLOR-CON) 20 MEQ packet Take 60 mEq by mouth 3 (three) times daily.       . tacrolimus (PROGRAF) 1 MG capsule Take 1 mg by mouth 2 (two) times daily.        No current facility-administered medications on file prior to visit.    Allergies  Allergen Reactions  . Morphine     MORPHINE, puritis    Past Medical History  Diagnosis Date  . Diabetes mellitus   . CAD (coronary artery disease) 2005    had CABG  . Hypertension   . Hyperlipidemia     low since renal failure  . ESRD (end stage renal disease) 2005    Dialysis then renal transplant  . Kidney transplant status, cadaveric 2012  . Depression   . GERD (gastroesophageal reflux disease)     Past Surgical History  Procedure Laterality Date  . Kidney transplant  09/13/2010    cadaver--at [facility]  . Coronary artery bypass graft  2005  . Tympanic membrane repair  1/12    left  . Dg angio av shunt*l*  right and left upper arms  . Fasciotomy  03/03/2012    Procedure: FASCIOTOMY;  Surgeon: [name], MD;  Location: [facility];  Service: Orthopedics;  Laterality: Right;  FASCIOTOMY RIGHT SMALL FINGER    Family History  Problem Relation Age of Onset  . Heart disease Father   . Diabetes Maternal Grandmother     History   Social History  . Marital Status: Married    Spouse Name: N/A    Number of Children: 2  . Years of Education: N/A   Occupational History  . 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 Smoking status: Never Smoker   . Smokeless tobacco: Never Used  . Alcohol Use: No  . Drug Use: No  . Sexually Active: Not on file   Other Topics Concern  . Not on file   Social History Narrative   Has living will   Wife is health care POA   Would accept resuscitation attempts   Hasn't considered tube feedings   Review of Systems  Constitutional: Negative for fatigue.       Wears seat belt  HENT:  Positive for congestion and rhinorrhea. Negative for hearing loss, dental problem and tinnitus.        Regular with dentist--appt coming up  Eyes: Negative for visual disturbance.       Recent eye exam  Respiratory: Positive for shortness of breath. Negative for cough and chest tightness.        Episodic SOB---rest or exercise. Humid weather worsens Now has oxygen for prn  Cardiovascular: Positive for leg swelling. Negative for chest pain and palpitations.       Edema improved  Gastrointestinal: Positive for constipation. Negative for nausea, vomiting, abdominal pain and blood in stool.       No heartburn on med Increased constipation lately---miralax helps  Endocrine: Negative for cold intolerance and heat intolerance.  Genitourinary: Negative for frequency and difficulty urinating.       Has ED Interested in Rx  Musculoskeletal: Positive for back pain. Negative for joint swelling and arthralgias.       Chronic low back pain Trying to walk  Allergic/Immunologic: Positive for environmental allergies. Negative for immunocompromised state.       Satisfied with nasonex  Neurological: Positive for numbness and headaches. Negative for dizziness, syncope, weakness and light-headedness.       Some headaches with the CPAP Early numbness in feet  Hematological: Negative for adenopathy. Does not bruise/bleed easily.  Psychiatric/Behavioral: Positive for sleep disturbance and dysphoric mood. The patient is not nervous/anxious.        Intermittent depressed mood--- cries without explanation at times       Objective:   Physical Exam  Constitutional: He is oriented to person, place, and time. He appears well-developed and well-nourished. No distress.  HENT:  Head: Normocephalic and atraumatic.  Right Ear: External ear normal.  Left Ear: External ear normal.  Mouth/Throat: Oropharynx is clear and moist. No oropharyngeal exudate.  Eyes: Conjunctivae and EOM are normal. Pupils are equal, round,  and reactive to light.  Neck: Normal range of motion. Neck supple. No thyromegaly present.  Cardiovascular: Normal rate, regular rhythm, normal heart sounds and intact distal pulses.  Exam reveals no gallop.   No murmur heard. Pulmonary/Chest: Effort normal and breath sounds normal. No respiratory distress. He has no wheezes. He has no rales.  Abdominal: Soft. There is no tenderness.  Musculoskeletal: Normal range of motion. He exhibits no edema and no tenderness.  Lymphadenopathy:    He has no cervical adenopathy.  Neurological: He is alert and oriented to person, place, and time.  Skin: No rash noted. No erythema.  Psychiatric: He has a normal mood and affect. His behavior is normal.          Assessment & Plan:

## 2012-10-20 NOTE — Assessment & Plan Note (Signed)
BP Readings from Last 3 Encounters:  10/20/12 160/90  09/24/12 148/100  09/21/12 150/80   Has been better controlled Didn't take his meds this AM Followed by nephrologist

## 2012-10-20 NOTE — Assessment & Plan Note (Signed)
Control is better Dr Gabriel Carina follows

## 2012-10-20 NOTE — Assessment & Plan Note (Signed)
Will give coupons for viagra and cialis to try

## 2012-10-23 ENCOUNTER — Other Ambulatory Visit: Payer: Self-pay

## 2012-10-23 LAB — BASIC METABOLIC PANEL
BUN: 16 mg/dL (ref 7–18)
Calcium, Total: 9.1 mg/dL (ref 8.5–10.1)
Chloride: 102 mmol/L (ref 98–107)
Co2: 40 mmol/L (ref 21–32)
EGFR (Non-African Amer.): >60
Glucose: 103 mg/dL — ABNORMAL HIGH (ref 65–99)

## 2012-10-26 ENCOUNTER — Other Ambulatory Visit: Payer: Self-pay | Admitting: *Deleted

## 2012-10-26 MED ORDER — GLIPIZIDE 10 MG PO TABS: 10.0000 mg | ORAL_TABLET | Freq: Two times a day (BID) | ORAL | Status: DC

## 2012-10-27 ENCOUNTER — Encounter: Payer: Self-pay | Admitting: Pulmonary Disease

## 2012-10-27 ENCOUNTER — Ambulatory Visit (INDEPENDENT_AMBULATORY_CARE_PROVIDER_SITE_OTHER): Payer: 59 | Admitting: Pulmonary Disease

## 2012-10-27 VITALS — BP 132/86 | HR 84 | Temp 98.1°F | Ht 70.5 in | Wt 337.0 lb

## 2012-10-27 DIAGNOSIS — R0600 Dyspnea, unspecified: Secondary | ICD-10-CM

## 2012-10-27 DIAGNOSIS — E662 Morbid (severe) obesity with alveolar hypoventilation: Secondary | ICD-10-CM

## 2012-10-27 DIAGNOSIS — R0609 Other forms of dyspnea: Secondary | ICD-10-CM

## 2012-10-27 DIAGNOSIS — R0989 Other specified symptoms and signs involving the circulatory and respiratory systems: Secondary | ICD-10-CM

## 2012-10-27 DIAGNOSIS — G4733 Obstructive sleep apnea (adult) (pediatric): Secondary | ICD-10-CM

## 2012-10-27 NOTE — Assessment & Plan Note (Addendum)
This is likely related to obesity and deconditioning in additional to hypoxia from obesity hypoventilation.  Will arrange for pulmonary function tests to further assess >> he was noted to have bronchial thickening on CT chest from [facility].  Depending on these he may be a candidate for pulmonary rehab to improve his exercise tolerance.

## 2012-10-27 NOTE — Assessment & Plan Note (Signed)
He has severe sleep apnea.  He has been recently started on CPAP, and doing better.  Discussed importance of using his CPAP whenever he is asleep.  Will arrange for overnight oximetry with CPAP and then determine whether he needs to use oxygen with CPAP at night.

## 2012-10-27 NOTE — Progress Notes (Signed)
Chief Complaint  Patient presents with  . Snoring  . Shortness of Breath   PRIMARY NEPHROLOGIST: Shea Stakes, M.D. and Baruch Goldmann, M.D. at [facility]. ENDOCRINOLOGIST: Betsey Holiday. Solum, M.D. in [place].  History of Present Illness: [name] is a 52 y.o. male for evaluation of sleep problems and dyspnea.  He is followed at [facility] after his renal transplant.  He was noted to have trouble with his breathing and snoring.  He was also told he stops breathing while asleep, and was told his oxygen level was low.  He was started on nocturnal oxygen, and still has oxygen set up at home.  He had sleep study in December 2013 at [name], and was found to have severe sleep apnea.  He was started on CPAP about 1 week ago.  He sleep is better.  He still has trouble with his breathing.    He has a nasal CPAP mask.  This fits well.  He can't sleep on his back.  He denies mouth dryness.  He goes to sleep at between 10 pm and 1 am.  He falls asleep after 25 minutes.  He wakes up 3 to 6 times to use the bathroom.  He gets out of bed at 530 and 630 am.  He feels tired in the morning.  He denies morning headache.  He does not use anything to help him fall sleep or stay awake.  He tried [place] years ago >> this was complicated by confusional arousals.  He denies sleep walking, sleep talking, bruxism, or nightmares.  There is no history of restless legs.  He denies sleep hallucinations, sleep paralysis, or cataplexy.  The Epworth score is 10 out of 24.  He was told his CO2 level was high >> he was not told why this is elevated.  He never smoked.  He denies cough, wheeze, or sputum.  There is no history of thrombo-embolic disease.  He had recent CT chest, V/Q scan, and labs at [facility] >> there were unrevealing for cause of his dyspnea except for ABG showing chronic hypercapnia and hypoxia.  He reports having cardiac evaluation with stress test which was negative.  Tests: PSG 03/15/12 >>  AHI 57.9, low SpO2 78%.  CPAP 12 cm H2O CT chest [facility]) 09/24/12 >> central bronchial thickening, LUL pleural scarring V/Q scan [facility]) 09/24/12 >> matched defect Lt upper lobe ABG [name]) 09/25/12 >> pH 7.40, PaCO2 68.2, PaO2 61.5  Echo bubble study [facility]) 09/26/12 >> poor study, no identified shunt Room air SpO2 10/27/12 >> 87%, start 2 liters oxygen 24/7  [name]  has a past medical history of Diabetes mellitus; CAD (coronary artery disease) (2005); Hypertension; Hyperlipidemia; ESRD (end stage renal disease) (2005); Kidney transplant status, cadaveric (2012); Depression; and GERD (gastroesophageal reflux disease).  [name]  has past surgical history that includes Kidney transplant (09/13/2010); Coronary artery bypass graft (2005); Tympanic membrane repair (1/12); dg angio av shunt*l*; and Fasciotomy (03/03/2012).  Prior to Admission medications   Medication Sig Start Date End Date Taking? Authorizing Provider  aspirin 81 MG tablet Take 81 mg by mouth daily.   Yes Historical Provider, MD  buPROPion (WELLBUTRIN XL) 150 MG 24 hr tablet Take 150 mg by mouth daily.   Yes Historical Provider, MD  colchicine 0.6 MG tablet Take 1 tablet (0.6 mg total) by mouth daily as needed (gout). 09/11/12  Yes Tonia Ghent, MD  furosemide (LASIX) 40 MG tablet Take 80 mg by mouth  daily.  10/13/12  Yes Historical Provider, MD  glipiZIDE (GLUCOTROL) 10 MG tablet Take 1 tablet (10 mg total) by mouth 2 (two) times daily before a meal. 10/26/12  Yes [name], MD  glucose blood (BAYER CONTOUR NEXT TEST) test strip Check blood glucose three times daily and as directed. 10/15/11  Yes [name], MD  insulin aspart (NOVOLOG) 100 UNIT/ML injection Inject 30 Units into the skin 3 (three) times daily before meals. 02/06/12 02/05/13 Yes [name], MD  insulin glargine (LANTUS) 100 UNIT/ML injection Inject 130 Units into the skin at bedtime.  12/27/11  Yes [name], MD  Insulin Pen  Needle 31G X 8 MM MISC Inject as directed 4 times daily as needed 04/22/11  Yes [name], MD  labetalol (NORMODYNE) 300 MG tablet Take 150-300 mg by mouth 2 (two) times daily.    Yes Historical Provider, MD  metolazone (ZAROXOLYN) 2.5 MG tablet Take 2.5 mg by mouth daily.     Yes Historical Provider, MD  mometasone (NASONEX) 50 MCG/ACT nasal spray Place 2 sprays into the nose daily. 11/15/11  Yes [name], MD  mycophenolate (MYFORTIC) 180 MG EC tablet Take 720 mg by mouth 2 (two) times daily.    Yes Historical Provider, MD  omeprazole (PRILOSEC) 20 MG capsule Take 1 capsule (20 mg total) by mouth daily. 03/04/12  Yes [name], MD  potassium chloride (KLOR-CON) 20 MEQ packet Take 60 mEq by mouth 3 (three) times daily.    Yes Historical Provider, MD  sildenafil (VIAGRA) 100 MG tablet Take 0.5-1 tablets (50-100 mg total) by mouth daily as needed for erectile dysfunction. 10/20/12  Yes [name], MD  tacrolimus (PROGRAF) 1 MG capsule Take 1 mg by mouth 2 (two) times daily.    Yes Historical Provider, MD  tadalafil (CIALIS) 20 MG tablet Take 0.5-1 tablets (10-20 mg total) by mouth every other day as needed for erectile dysfunction. 10/20/12  Yes [name], MD    Allergies  Allergen Reactions  . Morphine     MORPHINE, puritis    His family history includes Diabetes in his maternal grandmother and Heart disease in his father.  He  reports that he has never smoked. He has never used smokeless tobacco. He reports that he does not drink alcohol or use illicit drugs.  Review of Systems  Constitutional: Negative for fever, chills, diaphoresis, activity change, appetite change, fatigue and unexpected weight change.  HENT: Positive for congestion and sinus pressure. Negative for hearing loss, ear pain, nosebleeds, sore throat, facial swelling, rhinorrhea, sneezing, mouth sores, trouble swallowing, neck pain, neck stiffness, dental problem, voice change, postnasal drip,  tinnitus and ear discharge.   Eyes: Negative for photophobia, discharge, itching and visual disturbance.  Respiratory: Positive for cough and shortness of breath. Negative for apnea, choking, chest tightness, wheezing and stridor.   Cardiovascular: Negative for chest pain, palpitations and leg swelling.  Gastrointestinal: Negative for nausea, vomiting, abdominal pain, constipation, blood in stool and abdominal distention.  Genitourinary: Negative for dysuria, urgency, frequency, hematuria, flank pain, decreased urine volume and difficulty urinating.  Musculoskeletal: Negative for myalgias, back pain, joint swelling, arthralgias and gait problem.  Skin: Negative for color change, pallor and rash.  Neurological: Negative for dizziness, tremors, seizures, syncope, speech difficulty, weakness, light-headedness, numbness and headaches.  Hematological: Negative for adenopathy. Does not bruise/bleed easily.  Psychiatric/Behavioral: Positive for dysphoric mood. Negative for confusion, sleep disturbance and agitation. The patient is not nervous/anxious.  Physical Exam:  General - No distress ENT - No sinus tenderness, no oral exudate, MP 4, enlarged tongue, no LAN, no thyromegaly, TM clear, pupils equal/reactive Cardiac - s1s2 regular, no murmur, pulses symmetric Chest - Decreased breath sounds, no wheeze/rales Back - No focal tenderness Abd - Soft, non-tender, no organomegaly, + bowel sounds Ext - No edema, old AV grafts in Rt upper arm and Lt forearm Neuro - Normal strength, cranial nerves intact Skin - No rashes Psych - Normal mood, and behavior  Assessment:  Chesley [name], MD [place] 10/28/2012, 7:51 AM Pager:  [phone] After 3pm call: [phone]

## 2012-10-27 NOTE — Patient Instructions (Addendum)
Will arrange for PFT's (breathing tests) Will arrange for overnight oxygen test with CPAP machine Follow up in 6 to 8 weeks

## 2012-10-27 NOTE — Assessment & Plan Note (Addendum)
His oxygen level was 87% on room air at rest today.  Explained importance of using oxygen 24/7 for now.  Explained how his weight is likely the main cause for his hypoxia.  Will arrange for ONO with CPAP.  Depending on results will determine if he needs CPAP with oxygen at night, or if he would be better served with either BiPAP or ASV.

## 2012-10-27 NOTE — Progress Notes (Deleted)
  Subjective:    Patient ID: [name], male    DOB: 05-Apr-1960, 52 y.o.   MRN: [id number]  HPI    Review of Systems  Constitutional: Negative for fever, chills, diaphoresis, activity change, appetite change, fatigue and unexpected weight change.  HENT: Positive for congestion and sinus pressure. Negative for hearing loss, ear pain, nosebleeds, sore throat, facial swelling, rhinorrhea, sneezing, mouth sores, trouble swallowing, neck pain, neck stiffness, dental problem, voice change, postnasal drip, tinnitus and ear discharge.   Eyes: Negative for photophobia, discharge, itching and visual disturbance.  Respiratory: Positive for cough and shortness of breath. Negative for apnea, choking, chest tightness, wheezing and stridor.   Cardiovascular: Negative for chest pain, palpitations and leg swelling.  Gastrointestinal: Negative for nausea, vomiting, abdominal pain, constipation, blood in stool and abdominal distention.  Genitourinary: Negative for dysuria, urgency, frequency, hematuria, flank pain, decreased urine volume and difficulty urinating.  Musculoskeletal: Negative for myalgias, back pain, joint swelling, arthralgias and gait problem.  Skin: Negative for color change, pallor and rash.  Neurological: Negative for dizziness, tremors, seizures, syncope, speech difficulty, weakness, light-headedness, numbness and headaches.  Hematological: Negative for adenopathy. Does not bruise/bleed easily.  Psychiatric/Behavioral: Positive for dysphoric mood. Negative for confusion, sleep disturbance and agitation. The patient is not nervous/anxious.        Objective:   Physical Exam        Assessment & Plan:

## 2012-10-28 ENCOUNTER — Telehealth: Payer: Self-pay | Admitting: Pulmonary Disease

## 2012-10-28 NOTE — Assessment & Plan Note (Signed)
His BMI is 47.65.  Most likely this is the main culprit causing most of his current health problems.

## 2012-10-28 NOTE — Telephone Encounter (Signed)
Pt is aware of VS response to his results.

## 2012-10-28 NOTE — Telephone Encounter (Signed)
CT chest [facility]) 09/24/12 >> central bronchial thickening, LUL pleural scarring  V/Q scan [facility]) 09/24/12 >> matched defect Lt upper lobe  ABG [facility]) 09/25/12 >> pH 7.40, PaCO2 68.2, PaO2 61.5  Echo bubble study [facility]) 09/26/12 >> poor study, no identified shunt  Will have my nurse inform pt that lab tests from [facility] were reviewed.  These were unremarkable, and did not provide a cause for his breathing problems.  He is to continue oxygen 24/7, CPAP at night, and proceed with PFT's.    Please ensure pt has portable oxygen set up, and confirm with patient the name of his home care company for oxygen and CPAP supplies.  Will call him back with results of PFT and overnight oximetry with CPAP.

## 2012-10-30 LAB — BASIC METABOLIC PANEL
BUN: 25 mg/dL — ABNORMAL HIGH (ref 7–18)
Calcium, Total: 8.8 mg/dL (ref 8.5–10.1)
Chloride: 96 mmol/L — ABNORMAL LOW (ref 98–107)
Creatinine: 1.51 mg/dL — ABNORMAL HIGH (ref 0.60–1.30)
EGFR (African American): >60
EGFR (Non-African Amer.): 52 — ABNORMAL LOW
Potassium: 3 mmol/L — ABNORMAL LOW (ref 3.5–5.1)
Sodium: 138 mmol/L (ref 136–145)

## 2012-11-02 ENCOUNTER — Telehealth: Payer: Self-pay | Admitting: Pulmonary Disease

## 2012-11-02 NOTE — Telephone Encounter (Signed)
Contacted patient and he states that he had the study from [facility] in [place]. Phone # [phone] or [phone]. Called [facility] to inquire if they had the study. Had to leave message for them to return my call to obtain their fax # to fax the medical release. Rhonda J Cobb

## 2012-11-02 NOTE — Telephone Encounter (Signed)
Faxed Medical Release to [facility] in [place] to fax # [phone]. Waiting on study. Rhonda J Cobb

## 2012-11-04 NOTE — Telephone Encounter (Signed)
Sleep Study received from [facility] in [place] and placed in Dr. Juanetta Gosling look at. [name]

## 2012-11-11 ENCOUNTER — Other Ambulatory Visit: Payer: Self-pay | Admitting: Bariatrics

## 2012-11-11 DIAGNOSIS — G4730 Sleep apnea, unspecified: Secondary | ICD-10-CM

## 2012-11-16 ENCOUNTER — Telehealth: Payer: Self-pay | Admitting: Pulmonary Disease

## 2012-11-16 DIAGNOSIS — E662 Morbid (severe) obesity with alveolar hypoventilation: Secondary | ICD-10-CM

## 2012-11-16 DIAGNOSIS — G4733 Obstructive sleep apnea (adult) (pediatric): Secondary | ICD-10-CM

## 2012-11-16 NOTE — Telephone Encounter (Signed)
LMOMTCB x1 for pt--need to advise will send message over to Dr. Halford Chessman regarding results.   Please advise ONO results Dr. Halford Chessman thanks

## 2012-11-16 NOTE — Telephone Encounter (Signed)
This is a duplicate message so I will sign off and continue in prev note dated 11-02-12. [place] Bing, CMA

## 2012-11-17 ENCOUNTER — Telehealth: Payer: Self-pay | Admitting: Pulmonary Disease

## 2012-11-17 DIAGNOSIS — E662 Morbid (severe) obesity with alveolar hypoventilation: Secondary | ICD-10-CM

## 2012-11-17 DIAGNOSIS — G4733 Obstructive sleep apnea (adult) (pediatric): Secondary | ICD-10-CM

## 2012-11-17 NOTE — Telephone Encounter (Signed)
Diagnoses    OSA (obstructive sleep apnea) - Primary    327.23    Obesity hypoventilation syndrome     278.03          Call Documentation    Chesley [name], MD at 11/17/2012 1:51 PM    Status: Signed             ONO with CPAP and RA 11/02/12 >> Test time 1 hr 22 min. Mean SpO2 76%, low SpO2 59%. Spent 1 hr 16 min with SpO2 < 88%.  Will have my nurse inform pt that ONO shows very low oxygen level with CPAP alone. He will need to use 2 liters oxygen with CPAP, and will repeat ONO with this setting. Will also get current CPAP download.   Order placed to [facility] for [facility].

## 2012-11-17 NOTE — Addendum Note (Signed)
Addended by: Chesley [name] on: 11/17/2012 01:54 PM   Modules accepted: Orders

## 2012-11-17 NOTE — Telephone Encounter (Signed)
ONO with CPAP and RA 11/02/12 >> Test time 1 hr 22 min.  Mean SpO2 76%, low SpO2 59%.  Spent 1 hr 16 min with SpO2 < 88%.  Will have my nurse inform pt that ONO shows very low oxygen level with CPAP alone.  He will need to use 2 liters oxygen with CPAP, and will repeat ONO with this setting.  Will also get current CPAP download.

## 2012-11-20 ENCOUNTER — Other Ambulatory Visit: Payer: Medicare Other

## 2012-11-20 ENCOUNTER — Telehealth: Payer: Self-pay | Admitting: Pulmonary Disease

## 2012-11-20 DIAGNOSIS — G4733 Obstructive sleep apnea (adult) (pediatric): Secondary | ICD-10-CM

## 2012-11-20 NOTE — Telephone Encounter (Signed)
Pt is aware of download results. Order for ONO will be placed.

## 2012-11-20 NOTE — Telephone Encounter (Addendum)
CPAP 10/15/12 to 11/13/12 >> used on 30 of 30 nights with average 4 hrs 22 min.  Average AHI 3.8 with CPAP 12 cm H2O.  Will have my nurse inform pt that CPAP report shows good control of sleep apnea.  He is to continue CPAP with 2 liters oxygen.  Will get ONO with 2 liters and CPAP and call him with results >> ordered on 11/17/12.

## 2012-11-23 ENCOUNTER — Other Ambulatory Visit: Payer: Self-pay

## 2012-11-24 ENCOUNTER — Ambulatory Visit
Admission: RE | Admit: 2012-11-24 | Discharge: 2012-11-24 | Disposition: A | Discharge status: Home / Self Care | Payer: Medicare Other | Source: Ambulatory Visit | Attending: Bariatrics | Admitting: Bariatrics

## 2012-11-24 DIAGNOSIS — G4730 Sleep apnea, unspecified: Secondary | ICD-10-CM

## 2012-11-24 NOTE — Telephone Encounter (Signed)
Pt aware of results, see phone note 11-20-12. [place] Bing, CMA

## 2012-11-25 ENCOUNTER — Telehealth: Payer: Self-pay | Admitting: Pulmonary Disease

## 2012-11-25 DIAGNOSIS — R0600 Dyspnea, unspecified: Secondary | ICD-10-CM

## 2012-11-25 NOTE — Telephone Encounter (Signed)
I spoke with [name] and she stated they called pt and was advised [name] is already getting O2 through another DME. [name]'s team looked and did not see anyone else billing medicare for the O2. She is not sure who the pt is using. I advised will call and find out.  I spoke with pt and [name] stated [name] is on the road and does not remember but will look when [name] gets home and call us back with this information. Will await pt clal back

## 2012-11-26 DIAGNOSIS — E8881 Metabolic syndrome: Secondary | ICD-10-CM | POA: Insufficient documentation

## 2012-11-27 ENCOUNTER — Ambulatory Visit (INDEPENDENT_AMBULATORY_CARE_PROVIDER_SITE_OTHER): Payer: Medicare Other | Admitting: Pulmonary Disease

## 2012-11-27 DIAGNOSIS — R0609 Other forms of dyspnea: Secondary | ICD-10-CM

## 2012-11-27 DIAGNOSIS — R0989 Other specified symptoms and signs involving the circulatory and respiratory systems: Secondary | ICD-10-CM

## 2012-11-27 DIAGNOSIS — R0600 Dyspnea, unspecified: Secondary | ICD-10-CM

## 2012-11-27 NOTE — Progress Notes (Signed)
PFT done today. 

## 2012-11-27 NOTE — Telephone Encounter (Signed)
lmtcb x1 for pt

## 2012-11-30 ENCOUNTER — Telehealth: Payer: Self-pay | Admitting: Pulmonary Disease

## 2012-11-30 MED ORDER — ALBUTEROL SULFATE HFA 108 (90 BASE) MCG/ACT IN AERS: 2.0000 | INHALATION_SPRAY | Freq: Four times a day (QID) | RESPIRATORY_TRACT | Status: DC | PRN

## 2012-11-30 MED ORDER — BECLOMETHASONE DIPROPIONATE 80 MCG/ACT IN AERS: 2.0000 | INHALATION_SPRAY | Freq: Two times a day (BID) | RESPIRATORY_TRACT | Status: DC

## 2012-11-30 NOTE — Telephone Encounter (Signed)
I spoke with patient about results and he verbalized understanding and had no questions. rx's have been sent. Nothing further needed

## 2012-11-30 NOTE — Telephone Encounter (Signed)
Returning call can be reached at [phone].[name]

## 2012-11-30 NOTE — Telephone Encounter (Signed)
PFT 11/27/12 >> FEV1 1.74, FEV1% 80%, TLC 4.16 (60%), DLCO 81%, +BD  Will have my nurse inform pt that PFT shows changes suggestive of asthma.  Please send order for Qvar 80 mcg two puffs twice per day, and instruct pt to rinse mouth after each use.  Please also send order for proair two puffs as needed up to four times per day for cough, wheezing or chest congestion.  Dispense one inhaler of each with 3 refills.  Will discuss in more detail at [facility] in October.

## 2012-11-30 NOTE — Telephone Encounter (Signed)
lmtcb

## 2012-12-02 NOTE — Telephone Encounter (Signed)
Spoke with pt and he thinks taht oxygen is through [place]. Called [facility] and they verified that oxygen is ordered 2L continous.  Pt states that he would like to have all his euqipment through [facility] to make things easier.  Melissa from [facility] states that she would need a new order giving specifics  ( O2 2L with cpap and does pt use oxygen during day).  LMOM for pt to call to clairfy if he uses oxygen during day, exertion and is he already bleeding this into cpap.

## 2012-12-03 ENCOUNTER — Telehealth: Payer: Self-pay | Admitting: Pulmonary Disease

## 2012-12-03 NOTE — Telephone Encounter (Signed)
ATC patient no answer LMOMTCB 

## 2012-12-03 NOTE — Telephone Encounter (Signed)
Spoke to [name] and she stated no one has tried to call pt.  I advised pt of this.  He verbalized his understanding and nothing further needed. [name]

## 2012-12-04 NOTE — Telephone Encounter (Signed)
LMTCB

## 2012-12-07 NOTE — Telephone Encounter (Signed)
Spoke with patient,patient states he uses 2L continuous w exertion though out the day Also spoke with [facility] to verify what orders they have for patients o2-- 2L via [place] continuous Order already placed on 8/26 for CPAP and o2 to bleed through machine Order placed now for patient o2 w exertion through [facility] and to switch from [facility] to [facility] Patient is aware and nothing further needed at this time

## 2012-12-14 ENCOUNTER — Encounter: Payer: Self-pay | Admitting: Pulmonary Disease

## 2012-12-16 ENCOUNTER — Other Ambulatory Visit: Payer: Self-pay | Admitting: Bariatrics

## 2012-12-16 ENCOUNTER — Other Ambulatory Visit: Payer: Self-pay

## 2012-12-16 LAB — CBC WITH DIFFERENTIAL/PLATELET
Basophil #: 0.1 10*3/uL (ref 0.0–0.1)
Basophil %: 0.9 %
Eosinophil #: 0.1 10*3/uL (ref 0.0–0.7)
Eosinophil %: 1.1 %
HCT: 44.1 % (ref 40.0–52.0)
Lymphocyte #: 0.8 10*3/uL — ABNORMAL LOW (ref 1.0–3.6)
Lymphocyte %: 8.6 %
MCV: 73 fL — ABNORMAL LOW (ref 80–100)
Monocyte #: 0.6 x10 3/mm (ref 0.2–1.0)
Neutrophil #: 7.3 10*3/uL — ABNORMAL HIGH (ref 1.4–6.5)

## 2012-12-16 LAB — COMPREHENSIVE METABOLIC PANEL
Albumin: 3.7 g/dL (ref 3.4–5.0)
Chloride: 96 mmol/L — ABNORMAL LOW (ref 98–107)
Co2: 37 mmol/L — ABNORMAL HIGH (ref 21–32)
Creatinine: 1.15 mg/dL (ref 0.60–1.30)
EGFR (Non-African Amer.): >60
Osmolality: 274 (ref 275–301)
Potassium: 3 mmol/L — ABNORMAL LOW (ref 3.5–5.1)
SGPT (ALT): 26 U/L (ref 12–78)
Sodium: 136 mmol/L (ref 136–145)

## 2012-12-16 LAB — FOLATE: Folic Acid: 27.3 ng/mL (ref 3.1–100.0)

## 2012-12-16 LAB — FERRITIN: Ferritin (ARMC): 28 ng/mL (ref 8–388)

## 2012-12-16 LAB — IRON AND TIBC
Iron: 53 ug/dL — ABNORMAL LOW (ref 65–175)
Unbound Iron-Bind.Cap.: 426 ug/dL

## 2012-12-16 LAB — PROTIME-INR: Prothrombin Time: 14.6 secs (ref 11.5–14.7)

## 2012-12-16 LAB — MAGNESIUM: Magnesium: 1.6 mg/dL — ABNORMAL LOW

## 2012-12-16 LAB — AMYLASE: Amylase: 26 U/L (ref 25–115)

## 2012-12-18 ENCOUNTER — Encounter: Payer: Self-pay | Admitting: Pulmonary Disease

## 2012-12-22 ENCOUNTER — Telehealth: Payer: Self-pay | Admitting: Pulmonary Disease

## 2012-12-22 NOTE — Telephone Encounter (Signed)
I spoke with Melissa at [facility] and she states that the pt has had his cpap with them but has decided to change his oxygen over to them as well but because he has medicare he needs qualifying sats and a face-to-face within 30 days of the order and we do not have this because order was done through a phone note. The pt has an appt on 01-06-13 but this is over 30 days. We can either bring the pt in earlier to get sats, wait until 10-15 and do sats and a new order then or move his appt up sooner with Dr. Halford Chessman. I called the pt to see if he still had his oxygen with his other company because I did not want him to go without. He states they are scheduled to come pick this up in a day or 2. So I advised the pt of what info was needed for his insurance and that we will need him to come in this week  For an OV and sats test for insurance so he does not go without his oxygen. Pt states he can come in on Friday. Appt set with TP. Jess is also aware of what is needed. [place] Bing, CMA

## 2012-12-22 NOTE — Telephone Encounter (Signed)
[id number]. [place] Bing, CMA

## 2012-12-22 NOTE — Telephone Encounter (Signed)
[name] returning call.

## 2012-12-23 ENCOUNTER — Other Ambulatory Visit: Payer: Self-pay

## 2012-12-25 ENCOUNTER — Ambulatory Visit (INDEPENDENT_AMBULATORY_CARE_PROVIDER_SITE_OTHER): Payer: Medicare Other | Admitting: Adult Health

## 2012-12-25 ENCOUNTER — Encounter: Payer: Self-pay | Admitting: Adult Health

## 2012-12-25 VITALS — BP 134/80 | HR 76 | Temp 98.0°F | Ht 70.5 in | Wt 335.8 lb

## 2012-12-25 DIAGNOSIS — E662 Morbid (severe) obesity with alveolar hypoventilation: Secondary | ICD-10-CM

## 2012-12-25 DIAGNOSIS — G4733 Obstructive sleep apnea (adult) (pediatric): Secondary | ICD-10-CM

## 2012-12-25 NOTE — Patient Instructions (Addendum)
We will refer you to pulmonary rehab in [place].  Work on Weight loss.  Wear CPAP with oxygen At bedtime   Wear Oxygen 2l/m continuously.  We will send order to DME for smaller portable tanks follow up Dr. Halford Chessman  In  3 months and As needed

## 2012-12-25 NOTE — Progress Notes (Signed)
Subjective:    Patient ID: [name], male    DOB: 1960-12-23, 52 y.o.   MRN: [id number]  HPI Initial consult 10/27/12  evaluation of sleep problems and dyspnea.  10/27/12 : IOV  He is followed at [facility] after his renal transplant.  He was noted to have trouble with his breathing and snoring.  He was also told he stops breathing while asleep, and was told his oxygen level was low.  He was started on nocturnal oxygen, and still has oxygen set up at home.  He had sleep study in December 2013 at [facility], and was found to have severe sleep apnea.  He was started on CPAP about 1 week ago.  He sleep is better.  He still has trouble with his breathing.    He has a nasal CPAP mask.  This fits well.  He can't sleep on his back.  He denies mouth dryness.  He goes to sleep at between 10 pm and 1 am.  He falls asleep after 25 minutes.  He wakes up 3 to 6 times to use the bathroom.  He gets out of bed at 530 and 630 am.  He feels tired in the morning.  He denies morning headache.  He does not use anything to help him fall sleep or stay awake.  He tried [place] years ago >> this was complicated by confusional arousals.  He denies sleep walking, sleep talking, bruxism, or nightmares.  There is no history of restless legs.  He denies sleep hallucinations, sleep paralysis, or cataplexy.  The Epworth score is 10 out of 24.  He was told his CO2 level was high >> he was not told why this is elevated.  He never smoked.  He denies cough, wheeze, or sputum.  There is no history of thrombo-embolic disease.  He had recent CT chest, V/Q scan, and labs at [facility] >> there were unrevealing for cause of his dyspnea except for ABG showing chronic hypercapnia and hypoxia.  He reports having cardiac evaluation with stress test which was negative. >>rec PFT    12/25/2012 Follow up  2 month follow up OSA w/ qualifying sats >> reports sleep is doing well, wearing CPAP every night x4-5 hrs.  does report some head congestion with  clear mucus, PND, dry cough x2 weeks. No discolored mucus or fever. Nasal stuffiness in am after CPAP removed.   PFT 11/27/12 >> FEV1 1.74, FEV1% 80%, TLC 4.16 (60%), DLCO 81%, +BD Started on QVAR for possible Asthma component  Does feel breathing may be better on QVAR w/ less dyspnea.   Discussed pulmonary rehab, wants to begin in [place] , closer to home.  ON RA 86% at rest, 87% RA with walking. 93% on 2l/m .  Did not wear O2 today. Encouraged on compliance and advised of dangers of persistent desats.  He verbalized understanding.  Needs more portable systems., tanks too big.     Tests: PSG 03/15/12 >> AHI 57.9, low SpO2 78%.  CPAP 12 cm H2O CT chest [facility]) 09/24/12 >> central bronchial thickening, LUL pleural scarring V/Q scan [facility]) 09/24/12 >> matched defect Lt upper lobe ABG [facility]) 09/25/12 >> pH 7.40, PaCO2 68.2, PaO2 61.5  Echo bubble study [facility]) 09/26/12 >> poor study, no identified shunt Room air SpO2 10/27/12 >> 87%, start 2 liters oxygen 24/7   Review of Systems Constitutional:   No  weight loss, night sweats,  Fevers, chills,  +fatigue, or  lassitude.  HEENT:   No headaches,  Difficulty swallowing,  Tooth/dental problems, or  Sore throat,                No sneezing, itching, ear ache,  +nasal congestion, post nasal drip,   CV:  No chest pain,  Orthopnea, PND, s  anasarca, dizziness, palpitations, syncope.   GI  No heartburn, indigestion, abdominal pain, nausea, vomiting, diarrhea, change in bowel habits, loss of appetite, bloody stools.   Resp:    No chest wall deformity  Skin: no rash or lesions.  GU: no dysuria, change in color of urine, no urgency or frequency.  No flank pain, no hematuria   MS:  No joint pain or swelling.  No decreased range of motion.  No back pain.  Psych:  No change in mood or affect. No depression or anxiety.  No memory loss.         Objective:   Physical Exam GEN: A/Ox3; pleasant , NAD, obese   HEENT:  [place]/AT,   EACs-clear, TMs-wnl, NOSE-clear, THROAT-clear, no lesions, no postnasal drip or exudate noted.   NECK:  Supple w/ fair ROM; no JVD; normal carotid impulses w/o bruits; no thyromegaly or nodules palpated; no lymphadenopathy.  RESP  Clear  P & A; w/o, wheezes/ rales/ or rhonchi.no accessory muscle use, no dullness to percussion  CARD:  RRR, no m/r/g  , tr-1+ peripheral edema, pulses intact, no cyanosis or clubbing.  GI:   Soft & nt; nml bowel sounds; no organomegaly or masses detected.  Musco: Warm bil, no deformities or joint swelling noted.   Neuro: alert, no focal deficits noted.    Skin: Warm, no lesions or rashes         Assessment & Plan:

## 2012-12-25 NOTE — Assessment & Plan Note (Signed)
We will refer you to pulmonary rehab in [place].  Work on Weight loss.  Wear CPAP with oxygen At bedtime   Wear Oxygen 2l/m continuously.  We will send order to DME for smaller portable tanks follow up Dr. Halford Chessman  In  3 months and As needed

## 2012-12-25 NOTE — Assessment & Plan Note (Signed)
We will refer you to pulmonary rehab in [place].  Work on Weight loss.  Wear CPAP with oxygen At bedtime   Wear Oxygen 2l/m continuously.  We will send order to DME for smaller portable tanks follow up Dr. Halford Chessman  In  3 months and As needed

## 2013-01-04 ENCOUNTER — Ambulatory Visit: Payer: Self-pay | Admitting: Bariatrics

## 2013-01-06 ENCOUNTER — Ambulatory Visit: Payer: Medicare Other | Admitting: Pulmonary Disease

## 2013-01-19 ENCOUNTER — Telehealth: Payer: Self-pay | Admitting: Pulmonary Disease

## 2013-01-19 NOTE — Telephone Encounter (Signed)
CPAP 11/16/12 to 12/15/12 >> Used on 30 of 30 nights with average 3 hrs 56 min.  Average AHI 3.7 with CPAP 12 cm H2O.  ONO with CPAP and 2 liters 11/27/12 >> Test time 4 hrs 39 min.  Basal SpO2 88%, low SpO2 67%.  Spent 117 min with SpO2 < 88%  Will have my nurse inform pt that CPAP report shows good control of sleep apnea with current settings.  However he also has hypoventilation (shallow breathing) while asleep.  His oxygen level is still low in spite of using CPAP and 2 liters oxygen.  He needs ROV to review treatment options in more detail (he was scheduled for ROV 01/06/13, but was not able to attend ROV then).

## 2013-01-19 NOTE — Telephone Encounter (Signed)
Pt is aware of CPAP download results. ROV has been scheduled for 02/16/13 at 9am.

## 2013-01-23 ENCOUNTER — Other Ambulatory Visit: Payer: Self-pay

## 2013-01-23 ENCOUNTER — Ambulatory Visit: Payer: Self-pay | Admitting: Bariatrics

## 2013-01-26 ENCOUNTER — Encounter: Payer: Self-pay | Admitting: Pulmonary Disease

## 2013-01-28 ENCOUNTER — Other Ambulatory Visit: Payer: Self-pay

## 2013-02-01 ENCOUNTER — Encounter: Payer: Self-pay | Admitting: Pulmonary Disease

## 2013-02-08 ENCOUNTER — Other Ambulatory Visit: Payer: Self-pay

## 2013-02-08 DIAGNOSIS — M109 Gout, unspecified: Secondary | ICD-10-CM

## 2013-02-08 MED ORDER — HYDROCODONE-ACETAMINOPHEN 5-325 MG PO TABS: 1.0000 | ORAL_TABLET | Freq: Four times a day (QID) | ORAL | Status: DC | PRN

## 2013-02-08 MED ORDER — COLCHICINE 0.6 MG PO TABS: 0.6000 mg | ORAL_TABLET | Freq: Two times a day (BID) | ORAL | Status: DC | PRN

## 2013-02-08 NOTE — Telephone Encounter (Signed)
Colchicine sent to his pharmacy Confirm that he wanted to use [name]  Hydrocodone printed

## 2013-02-08 NOTE — Telephone Encounter (Signed)
Pt left v/m requesting refill colchicine; pt having gout in rt ankle that started 02/07/13; rt ankle is red,swollen and painful. No red streaks or fever. Pt said very painful to walk on. Pt also request rx hydrocodone apap. Pt does not want to schedule appt; pt said he is sure it is gout. Pt request cb and wants pts mother to pick up hydrocodone.Please advise.

## 2013-02-09 NOTE — Telephone Encounter (Signed)
Spoke with patient and advised rx ready for pick-up and it will be at the front desk.  

## 2013-02-16 ENCOUNTER — Telehealth: Payer: Self-pay | Admitting: Pulmonary Disease

## 2013-02-16 ENCOUNTER — Ambulatory Visit: Payer: 59 | Admitting: Pulmonary Disease

## 2013-02-16 NOTE — Telephone Encounter (Signed)
Okay to double book visit with me, or arrange for ROV with Tammy Parrett.
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
## 2013-02-16 NOTE — Telephone Encounter (Signed)
I called and spoke with pt and is aware. appt scheduled with tp.

## 2013-02-22 ENCOUNTER — Encounter: Payer: Self-pay | Admitting: Pulmonary Disease

## 2013-02-22 ENCOUNTER — Ambulatory Visit: Payer: Self-pay | Admitting: Bariatrics

## 2013-02-25 ENCOUNTER — Ambulatory Visit (INDEPENDENT_AMBULATORY_CARE_PROVIDER_SITE_OTHER): Payer: Medicare Other | Admitting: Adult Health

## 2013-02-25 ENCOUNTER — Encounter: Payer: Self-pay | Admitting: Physician Assistant

## 2013-02-25 ENCOUNTER — Telehealth: Payer: Self-pay | Admitting: Internal Medicine

## 2013-02-25 ENCOUNTER — Encounter: Payer: Self-pay | Admitting: Adult Health

## 2013-02-25 ENCOUNTER — Encounter: Payer: Self-pay | Admitting: Gastroenterology

## 2013-02-25 VITALS — BP 130/68 | HR 91 | Temp 98.4°F | Ht 71.0 in | Wt 336.0 lb

## 2013-02-25 DIAGNOSIS — Z1211 Encounter for screening for malignant neoplasm of colon: Secondary | ICD-10-CM

## 2013-02-25 DIAGNOSIS — G4733 Obstructive sleep apnea (adult) (pediatric): Secondary | ICD-10-CM

## 2013-02-25 DIAGNOSIS — J45909 Unspecified asthma, uncomplicated: Secondary | ICD-10-CM

## 2013-02-25 DIAGNOSIS — J45998 Other asthma: Secondary | ICD-10-CM

## 2013-02-25 HISTORY — DX: Other asthma: J45.998

## 2013-02-25 NOTE — Assessment & Plan Note (Signed)
OSA/OHS  improved on CPAP  ? Mask causing eye isses-change to full face mask.  ONO with desats on CPAP w/ 2 l/m of O2- Will increased O2 3l/m , recheck ONO in 2 weeks  If cont desats will need BIPAP titration study in sleep lab

## 2013-02-25 NOTE — Addendum Note (Signed)
Addended by: Parke Poisson E on: 02/25/2013 05:10 PM   Modules accepted: Orders

## 2013-02-25 NOTE — Assessment & Plan Note (Signed)
Controlled on rx

## 2013-02-25 NOTE — Patient Instructions (Signed)
Continue good work at pulmonary rehab in [place].  Work on Weight loss.  Wear CPAP with oxygen At bedtime   Increase Oxygen 3l/m with CPAP At bedtime   We are setting you up for Oxygen check at bedtime with CPAP and O2.  Wear Oxygen at 2l/m with activity .  Follow up Dr. Halford Chessman  In  3 months and As needed

## 2013-02-25 NOTE — Telephone Encounter (Signed)
Pt called requesting a referral for a colonoscopy. He would prefer [place] if available.  Can you place the referral or does he need to make an apptmt w/you first.  Pt would like procedure prior to end of year if possible, even if it means going to [place].

## 2013-02-25 NOTE — Progress Notes (Signed)
Subjective:    Patient ID: [name], male    DOB: [birth year], 52 y.o.   MRN: [id number]  HPI Initial consult 10/27/12  evaluation of sleep problems and dyspnea.  10/27/12 : IOV  He is followed at [facility] after his renal transplant.  He was noted to have trouble with his breathing and snoring.  He was also told he stops breathing while asleep, and was told his oxygen level was low.  He was started on nocturnal oxygen, and still has oxygen set up at home.  He had sleep study in December 2013 at [facility], and was found to have severe sleep apnea.  He was started on CPAP about 1 week ago.  He sleep is better.  He still has trouble with his breathing.    He has a nasal CPAP mask.  This fits well.  He can't sleep on his back.  He denies mouth dryness.  He goes to sleep at between 10 pm and 1 am.  He falls asleep after 25 minutes.  He wakes up 3 to 6 times to use the bathroom.  He gets out of bed at 530 and 630 am.  He feels tired in the morning.  He denies morning headache.  He does not use anything to help him fall sleep or stay awake.  He tried [place] years ago >> this was complicated by confusional arousals.  He denies sleep walking, sleep talking, bruxism, or nightmares.  There is no history of restless legs.  He denies sleep hallucinations, sleep paralysis, or cataplexy.  The Epworth score is 10 out of 24.  He was told his CO2 level was high >> he was not told why this is elevated.  He never smoked.  He denies cough, wheeze, or sputum.  There is no history of thrombo-embolic disease.  He had recent CT chest, V/Q scan, and labs at [facility] >> there were unrevealing for cause of his dyspnea except for ABG showing chronic hypercapnia and hypoxia.  He reports having cardiac evaluation with stress test which was negative. >>rec PFT    12/25/12 Follow up  2 month follow up OSA w/ qualifying sats >> reports sleep is doing well, wearing CPAP every night x4-5 hrs.  does report some head congestion with  clear mucus, PND, dry cough x2 weeks. No discolored mucus or fever. Nasal stuffiness in am after CPAP removed.  >refer to pulm rehab   PFT 11/27/12 >> FEV1 1.74, FEV1% 80%, TLC 4.16 (60%), DLCO 81%, +BD Started on QVAR for possible Asthma component  Does feel breathing may be better on QVAR w/ less dyspnea.   Discussed pulmonary rehab, wants to begin in [place] , closer to home.  ON RA 86% at rest, 87% RA with walking. 93% on 2l/m .  Did not wear O2 today. Encouraged on compliance and advised of dangers of persistent desats.  He verbalized understanding.  Needs more portable systems., tanks too big.   02/25/2013 Follow up Patient returns for followup of sleep apnea, and asthma. Patient says that his breathing has been doing well on Qvar without any increased shortness, of breath, cough, or wheezing. No increased rescue inhaler use. Recent overnight oximetry showed. Patient had persistent desaturations despite CPAP with oxygen at 2 L. CPAP download showed good compliance. Patient says that he wears his CPAP every night, feels that he is rested. He denies any  Known mask leaks  Patient has been having some difficulty with his tear duct dryness and forced air from  mask into tear ducts.  Seen by Eye doctor with eye drops given.  Patient is going to pulmonary rehabilitation and states that he feels that is improving him quite a bit. He is planning a gastric bypass surgery for the spring of next year. He denies any chest pain, hemoptysis, orthopnea, PND, or leg swelling     Tests: PSG 03/15/12 >> AHI 57.9, low SpO2 78%.  CPAP 12 cm H2O CT chest [facility]) 09/24/12 >> central bronchial thickening, LUL pleural scarring V/Q scan [facility]) 09/24/12 >> matched defect Lt upper lobe ABG [facility]) 09/25/12 >> pH 7.40, PaCO2 68.2, PaO2 61.5  Echo bubble study [facility]) 09/26/12 >> poor study, no identified shunt Room air SpO2 10/27/12 >> 87%, start 2 liters oxygen 24/7 ONO with CPAP and 2 liters  11/27/12 >> Test time 4 hrs 39 min. Basal SpO2 88%, low SpO2 67%. Spent 117 min with SpO2 < 88%   Review of Systems  Constitutional:   No  weight loss, night sweats,  Fevers, chills,  +fatigue, or  lassitude.  HEENT:   No headaches,  Difficulty swallowing,  Tooth/dental problems, or  Sore throat,                No sneezing, itching, ear ache,  +nasal congestion, post nasal drip,   CV:  No chest pain,  Orthopnea, PND, s  anasarca, dizziness, palpitations, syncope.   GI  No heartburn, indigestion, abdominal pain, nausea, vomiting, diarrhea, change in bowel habits, loss of appetite, bloody stools.   Resp:    No chest wall deformity  Skin: no rash or lesions.  GU: no dysuria, change in color of urine, no urgency or frequency.  No flank pain, no hematuria   MS:  No joint pain or swelling.  No decreased range of motion.  No back pain.  Psych:  No change in mood or affect. No depression or anxiety.  No memory loss.         Objective:   Physical Exam  GEN: A/Ox3; pleasant , NAD, obese   HEENT:  [place]/AT,  EACs-clear, TMs-wnl, NOSE-clear, THROAT-clear, no lesions, no postnasal drip or exudate noted.   NECK:  Supple w/ fair ROM; no JVD; normal carotid impulses w/o bruits; no thyromegaly or nodules palpated; no lymphadenopathy.  RESP  Clear  P & A; w/o, wheezes/ rales/ or rhonchi.no accessory muscle use, no dullness to percussion  CARD:  RRR, no m/r/g  , tr-+ peripheral edema, pulses intact, no cyanosis or clubbing.  GI:   Soft & nt; nml bowel sounds; no organomegaly or masses detected.  Musco: Warm bil, no deformities or joint swelling noted.   Neuro: alert, no focal deficits noted.    Skin: Warm, no lesions or rashes         Assessment & Plan:

## 2013-02-25 NOTE — Progress Notes (Signed)
Reviewed and agree with assessment/plan. 

## 2013-03-01 ENCOUNTER — Telehealth: Payer: Self-pay | Admitting: Adult Health

## 2013-03-01 ENCOUNTER — Encounter: Payer: Self-pay | Admitting: Physician Assistant

## 2013-03-01 ENCOUNTER — Ambulatory Visit (INDEPENDENT_AMBULATORY_CARE_PROVIDER_SITE_OTHER): Payer: 59 | Admitting: Physician Assistant

## 2013-03-01 VITALS — BP 110/62 | HR 60 | Ht 70.0 in | Wt 336.0 lb

## 2013-03-01 DIAGNOSIS — Z1211 Encounter for screening for malignant neoplasm of colon: Secondary | ICD-10-CM

## 2013-03-01 DIAGNOSIS — G4733 Obstructive sleep apnea (adult) (pediatric): Secondary | ICD-10-CM

## 2013-03-01 DIAGNOSIS — Z940 Kidney transplant status: Secondary | ICD-10-CM

## 2013-03-01 HISTORY — DX: Kidney transplant status: Z94.0

## 2013-03-01 NOTE — Progress Notes (Signed)
I agree. With his multiple serious comorbid conditions, I don't think invasive colon cancer screening is best first options here.

## 2013-03-01 NOTE — Telephone Encounter (Signed)
Noted and will sign off message

## 2013-03-01 NOTE — Progress Notes (Signed)
Subjective:    Patient ID: [name], male    DOB: 10-Jun-1960, 52 y.o.   MRN: [id number]  HPI  Iris is a pleasant 52 year old Hispanic male referred today for consideration of screening colonoscopy. He is a patient of Dr. Alla German  and has been seeing a surgeon Dr. Duke Salvia  For pre- gastric bypass evaluation. It was suggested he consider screening colonoscopy. Patient currently has no GI symptoms with the exception of mild constipation which he manages with MiraLax as needed.  He is on numerous medications. He has history of severe sleep apnea and is supposed to be on oxygen 3 L continuously though he says he wears it at night. He also has congestive heart failure, morbid obesity, obesity hypo-.  ventilation syndrome, is status post renal transplant, has hypertension,insulin-dependent diabetes and hyperlipidemia. Family history is negative for colon cancer and polyps as far as he is aware. He has not had any prior colon screening.    Review of Systems  Constitutional: Negative.   HENT: Negative.   Eyes: Negative.   Respiratory: Positive for shortness of breath.   Cardiovascular: Negative.   Gastrointestinal: Negative.   Endocrine: Negative.   Genitourinary: Negative.   Musculoskeletal: Positive for arthralgias.  Skin: Negative.   Allergic/Immunologic: Negative.   Neurological: Negative.   Hematological: Negative.   Psychiatric/Behavioral: Negative.       Outpatient Prescriptions Prior to Visit  Medication Sig Dispense Refill  . albuterol (PROAIR HFA) 108 (90 BASE) MCG/ACT inhaler Inhale 2 puffs into the lungs 4 (four) times daily as needed for wheezing or shortness of breath (cough).  1 Inhaler  3  . aspirin 81 MG tablet Take 81 mg by mouth daily.      . beclomethasone (QVAR) 80 MCG/ACT inhaler Inhale 2 puffs into the lungs 2 (two) times daily.  1 Inhaler  3  . buPROPion (WELLBUTRIN XL) 150 MG 24 hr tablet Take 150 mg by mouth daily.      . colchicine 0.6 MG tablet Take 1  tablet (0.6 mg total) by mouth 2 (two) times daily as needed (gout).  60 tablet  0  . furosemide (LASIX) 40 MG tablet Take 80 mg by mouth daily.       Marland Kitchen glipiZIDE (GLUCOTROL) 10 MG tablet Take 1 tablet (10 mg total) by mouth 2 (two) times daily before a meal.  180 tablet  0  . glucose blood (BAYER CONTOUR NEXT TEST) test strip Check blood glucose three times daily and as directed.  100 each  11  . HYDROcodone-acetaminophen (NORCO) 5-325 MG per tablet Take 1 tablet by mouth every 6 (six) hours as needed.  30 tablet  0  . insulin aspart (NOVOLOG) 100 UNIT/ML injection Inject 10-20 Units into the skin 3 times daily before meals.      . insulin glargine (LANTUS) 100 UNIT/ML injection Inject 130 Units into the skin at bedtime.       . Insulin Pen Needle 31G X 8 MM MISC Inject as directed 4 times daily as needed  400 each  3  . labetalol (NORMODYNE) 300 MG tablet Take 150-300 mg by mouth 2 (two) times daily.       Marland Kitchen lisinopril (PRINIVIL,ZESTRIL) 5 MG tablet Take 5 mg by mouth at bedtime.      . magnesium oxide (MAGOX 400) 400 (241.3 MG) MG tablet Take 400 mg by mouth 2 (two) times daily.      . metolazone (ZAROXOLYN) 2.5 MG tablet Take 2.5 mg by mouth  daily.        . mometasone (NASONEX) 50 MCG/ACT nasal spray Place 2 sprays into the nose daily.  17 g  11  . mycophenolate (MYFORTIC) 180 MG EC tablet Take 720 mg by mouth 2 (two) times daily.       Marland Kitchen omeprazole (PRILOSEC) 20 MG capsule Take 1 capsule (20 mg total) by mouth daily.  30 capsule  11  . potassium chloride (KLOR-CON) 20 MEQ packet Take 60 mEq by mouth 3 (three) times daily.       . sildenafil (VIAGRA) 100 MG tablet Take 0.5-1 tablets (50-100 mg total) by mouth daily as needed for erectile dysfunction.  3 tablet  11  . tacrolimus (PROGRAF) 1 MG capsule Take 1 mg by mouth 2 (two) times daily.       . tadalafil (CIALIS) 20 MG tablet Take 0.5-1 tablets (10-20 mg total) by mouth every other day as needed for erectile dysfunction.  3 tablet  11   No  facility-administered medications prior to visit.   Allergies  Allergen Reactions  . Morphine     MORPHINE, puritis   Patient Active Problem List   Diagnosis Date Noted  . History of renal transplant 03/01/2013  . Unspecified asthma(493.90) 02/25/2013  . Severe obesity (BMI >= 40) 10/28/2012  . Dyspnea 10/27/2012  . Erectile dysfunction 10/20/2012  . Obstructive sleep apnea 09/29/2012  . Gout 09/13/2012  . 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 Obesity hypoventilation syndrome 03/31/2012  . Routine general medical examination at a health care facility 11/12/2011  . Grief reaction 11/12/2011  . Trigger finger 09/25/2011  . Type II or unspecified type diabetes mellitus with renal manifestations, not stated as uncontrolled(250.40) 03/01/2011  . CAD (coronary artery disease)   . Hypertension   . Hyperlipidemia   . ESRD (end stage renal disease)    History  Substance Use Topics  . Smoking status: Never Smoker   . Smokeless tobacco: Never Used  . Alcohol Use: No   family history includes Breast cancer in his maternal grandmother; Diabetes in his maternal grandmother; Heart disease in his father; Liver cancer in his paternal grandmother and paternal uncle.  Objective:   Physical Exam  Well-developed morbidly obese Hispanic male in no acute distress, pleasant blood pressure 110/62 pulse 60 height 5 foot 10 weight 336 BMI 48.2. HEENT; nontraumatic normocephalic EOMI PERRLA sclera anicteric, Supple ;no JVD, Cardiovascular; regular rate and rhythm with S1-S2 no murmur or gallop, Pulmonary; decreased breath sounds bilaterally, Abdomen ;morbidly obese nontender bowel sounds are active no palpable mass or hepatosplenomegaly, Extremities; no clubbing cyanosis or edema skin warm dry, Psych ;mood and affect appropriate.        Assessment & Plan:  #44  52 year old Hispanic male with multiple serious comorbidities referred for colon neoplasia screening. With  history of morbid obesity and severe sleep apnea and congestive heart failure he  it at high-risk for complications with sedation and at this time he'll risk outweighs benefit. #2 patient undergoing evaluation for gastric bypass surgery #3 end-stage renal disease status post renal transplant #4 insulin-dependent diabetes mellitus #5 severe sleep apnea /and  patient is supposed to be on continuous oxygen at 3 L/min  For  obesity hypoventilation syndrome #6 hyperlipidemia #7 hypertension #8 congestive heart failure  Plan;Will screen patient with Cologuard stool DNA testing which is at least 87% specific for detection of colon cancers and advanced adenomas. If DNA testing is positive then can further discuss risk-benefit of colonoscopy.  If DNA testing is negative, patient will not need any other colon neoplasia screening at this time.

## 2013-03-01 NOTE — Patient Instructions (Signed)
We have faxed the requisition form to [employer].    You will receive a call from [employer] and a package in the mail. We will call you once we have the results from [employer].  Amy Esterwood PA-C wrote a letter which we have provided you .

## 2013-03-02 ENCOUNTER — Other Ambulatory Visit: Payer: Self-pay | Admitting: *Deleted

## 2013-03-10 ENCOUNTER — Encounter: Payer: 59 | Admitting: Gastroenterology

## 2013-03-10 LAB — BASIC METABOLIC PANEL
Anion Gap: 2 — ABNORMAL LOW (ref 7–16)
BUN: 16 mg/dL (ref 7–18)
Calcium, Total: 9.7 mg/dL (ref 8.5–10.1)
Chloride: 97 mmol/L — ABNORMAL LOW (ref 98–107)
Co2: 39 mmol/L — ABNORMAL HIGH (ref 21–32)
Creatinine: 1.17 mg/dL (ref 0.60–1.30)
EGFR (African American): >60
EGFR (Non-African Amer.): >60
Glucose: 100 mg/dL — ABNORMAL HIGH (ref 65–99)
Osmolality: 277 (ref 275–301)
Potassium: 3.1 mmol/L — ABNORMAL LOW (ref 3.5–5.1)
Sodium: 138 mmol/L (ref 136–145)

## 2013-03-10 LAB — PHOSPHORUS: Phosphorus: 2.6 mg/dL (ref 2.5–4.9)

## 2013-03-15 ENCOUNTER — Other Ambulatory Visit: Payer: Self-pay | Admitting: *Deleted

## 2013-03-15 MED ORDER — OMEPRAZOLE 20 MG PO CPDR: 20.0000 mg | DELAYED_RELEASE_CAPSULE | Freq: Every day | ORAL | Status: DC

## 2013-03-15 NOTE — Telephone Encounter (Signed)
Received faxed refill request from pharmacy. Refill sent to pharmacy electronically.

## 2013-03-16 ENCOUNTER — Encounter: Payer: Self-pay | Admitting: Adult Health

## 2013-03-17 ENCOUNTER — Telehealth: Payer: Self-pay | Admitting: Adult Health

## 2013-03-17 NOTE — Telephone Encounter (Signed)
12.16.14 ONO on CPAP and 3lpm received from [facility] Per TP: better on 3lpm, only 18 mins <88%.  Continue on 3lpm w/ CPAP.  LMOM TCB x1 to advise of ONO results per TP. Message routed to back to my inbasket.

## 2013-03-23 ENCOUNTER — Encounter: Payer: Self-pay | Admitting: *Deleted

## 2013-03-23 NOTE — Telephone Encounter (Signed)
I spoke with patient about results and he verbalized understanding and had no questions 

## 2013-03-25 ENCOUNTER — Encounter: Payer: Self-pay | Admitting: Pulmonary Disease

## 2013-03-25 ENCOUNTER — Ambulatory Visit: Payer: Self-pay | Admitting: Bariatrics

## 2013-03-25 HISTORY — PX: ROUX-EN-Y GASTRIC BYPASS: SHX1104

## 2013-03-30 ENCOUNTER — Ambulatory Visit: Payer: Medicare Other | Admitting: Cardiovascular Disease

## 2013-03-31 ENCOUNTER — Telehealth: Payer: Self-pay

## 2013-03-31 DIAGNOSIS — Z1211 Encounter for screening for malignant neoplasm of colon: Secondary | ICD-10-CM

## 2013-03-31 DIAGNOSIS — R195 Other fecal abnormalities: Secondary | ICD-10-CM

## 2013-03-31 NOTE — Telephone Encounter (Signed)
Message copied by Barron Alvine on Wed Mar 31, 2013  9:24 AM ------      Message from: Nicoletta Ba S      Created: Wed Mar 31, 2013  9:01 AM      Regarding: FW: positive cologuard       Lateria Alderman..please call this pt and let him know the Cologuard stool test came back positive..so he does need to have a colonoscopy. Please get him scheduled with DR. Ardis Hughs at [place]  With [place] on his next available Thursday -thanks,Amy      ----- Message -----         From: [name], MD         Sent: 03/30/2013   1:57 PM           To: Amy S Esterwood, PA-C      Subject: RE: positive cologuard                                   I agree, he'll need colonoscopy at [facility] with MAC sedation during my next available Thursday.  Thanks                  ----- Message -----         From: Alfredia Ferguson, PA-C         Sent: 03/30/2013   1:34 PM           To: [name], MD      Subject: positive cologuard                                       Dan, please review my note on this pt, unfortunately his Cologuard is positive- he is a bad colonoscopy candidate-severe sleep apnea, on o2 for obesity hypoventilation, and BMI is 48... I guess will need a hospital procedure... Let me know             ------

## 2013-04-01 NOTE — Telephone Encounter (Signed)
Left message on machine to call back  

## 2013-04-02 ENCOUNTER — Other Ambulatory Visit: Payer: Self-pay

## 2013-04-02 DIAGNOSIS — Z1211 Encounter for screening for malignant neoplasm of colon: Secondary | ICD-10-CM

## 2013-04-02 DIAGNOSIS — R195 Other fecal abnormalities: Secondary | ICD-10-CM

## 2013-04-02 MED ORDER — MOVIPREP 100 G PO SOLR: 1.0000 | Freq: Once | ORAL | Status: DC

## 2013-04-02 NOTE — Telephone Encounter (Signed)
Pt will come in on Monday for instructions around 2 pm and ask for [facility]

## 2013-04-08 ENCOUNTER — Encounter (HOSPITAL_COMMUNITY): Payer: Self-pay | Admitting: *Deleted

## 2013-04-08 ENCOUNTER — Encounter (HOSPITAL_COMMUNITY): Payer: Self-pay | Admitting: Pharmacy Technician

## 2013-04-15 ENCOUNTER — Encounter: Payer: Self-pay | Admitting: Cardiovascular Disease

## 2013-04-15 ENCOUNTER — Ambulatory Visit (INDEPENDENT_AMBULATORY_CARE_PROVIDER_SITE_OTHER): Payer: 59 | Admitting: Cardiovascular Disease

## 2013-04-15 VITALS — BP 158/85 | HR 81 | Ht 70.5 in | Wt 329.5 lb

## 2013-04-15 DIAGNOSIS — R0989 Other specified symptoms and signs involving the circulatory and respiratory systems: Secondary | ICD-10-CM

## 2013-04-15 DIAGNOSIS — R0600 Dyspnea, unspecified: Secondary | ICD-10-CM

## 2013-04-15 DIAGNOSIS — Z01810 Encounter for preprocedural cardiovascular examination: Secondary | ICD-10-CM

## 2013-04-15 DIAGNOSIS — Z01818 Encounter for other preprocedural examination: Secondary | ICD-10-CM

## 2013-04-15 DIAGNOSIS — R0609 Other forms of dyspnea: Secondary | ICD-10-CM

## 2013-04-15 DIAGNOSIS — I10 Essential (primary) hypertension: Secondary | ICD-10-CM

## 2013-04-15 DIAGNOSIS — I2510 Atherosclerotic heart disease of native coronary artery without angina pectoris: Secondary | ICD-10-CM

## 2013-04-15 NOTE — Patient Instructions (Signed)
Your physician has requested that you have an echocardiogram. Echocardiography is a painless test that uses sound waves to create images of your heart. It provides your doctor with information about the size and shape of your heart and how well your heart's chambers and valves are working. This procedure takes approximately one hour. There are no restrictions for this procedure.   Your physician wants you to follow-up in: 6 months. You will receive a reminder letter in the mail two months in advance. If you don't receive a letter, please call our office to schedule the follow-up appointment.  

## 2013-04-17 ENCOUNTER — Encounter: Payer: Self-pay | Admitting: Cardiovascular Disease

## 2013-04-17 DIAGNOSIS — Z01810 Encounter for preprocedural cardiovascular examination: Secondary | ICD-10-CM | POA: Insufficient documentation

## 2013-04-17 NOTE — Progress Notes (Signed)
HPI  This is a pleasant 53 year old male who is here for preoperative cardiovascular evaluation before gastric bypass surgery. He has known history of coronary artery disease status post CABG in 2004 with no recurrent ischemic events since then, type 2 diabetes, end-stage renal disease previously on hemodialysis from 2004 2012 when he got a kidney transplant, hypertension, hyperlipidemia and sleep apnea. He was evaluated by me in February of 2014 when he was admitted to [facility] with atypical chest pain in the setting of bronchitis. He underwent an outpatient nuclear stress test which showed no evidence of ischemia. However, the ejection fraction was reported to be 31%. The patient reports diagnosis of congestive heart failure since his kidney transplant and has been on diuretic treatment. He denies any chest pain or significant shortness of breath. He exercises almost daily on a treadmill and stationary bike for about 40 minutes. His blood pressure is elevated today but he reports of blood pressure has been actually running on the low side lately.  Allergies  Allergen Reactions  . Morphine     MORPHINE, puritis     Current Outpatient Prescriptions on File Prior to Visit  Medication Sig Dispense Refill  . albuterol (PROAIR HFA) 108 (90 BASE) MCG/ACT inhaler Inhale 2 puffs into the lungs 4 (four) times daily as needed for wheezing or shortness of breath (cough).  1 Inhaler  3  . aspirin 81 MG tablet Take 81 mg by mouth daily.      . beclomethasone (QVAR) 80 MCG/ACT inhaler Inhale 2 puffs into the lungs 2 (two) times daily.  1 Inhaler  3  . buPROPion (WELLBUTRIN XL) 150 MG 24 hr tablet Take 150 mg by mouth every morning.       . furosemide (LASIX) 40 MG tablet Take 80 mg by mouth 2 (two) times daily.       Marland Kitchen glipiZIDE (GLUCOTROL) 10 MG tablet Take 1 tablet (10 mg total) by mouth 2 (two) times daily before a meal.  180 tablet  0  . insulin aspart (NOVOLOG) 100 UNIT/ML injection Inject 1-2 Units into  the skin as needed.       . insulin glargine (LANTUS) 100 UNIT/ML injection Inject 120 Units into the skin at bedtime.       Marland Kitchen labetalol (NORMODYNE) 300 MG tablet Take 300 mg by mouth 2 (two) times daily.       Marland Kitchen lisinopril (PRINIVIL,ZESTRIL) 5 MG tablet Take 5 mg by mouth at bedtime.      . magnesium oxide (MAGOX 400) 400 (241.3 MG) MG tablet Take 400 mg by mouth 2 (two) times daily.      . metolazone (ZAROXOLYN) 2.5 MG tablet Take 2.5 mg by mouth daily as needed (for excess fluid).       . mometasone (NASONEX) 50 MCG/ACT nasal spray Place 2 sprays into the nose daily.  17 g  11  . MOVIPREP 100 G SOLR Take 1 kit (200 g total) by mouth once.  1 kit  0  . Multiple Vitamin (MULTIVITAMIN WITH MINERALS) TABS tablet Take 1 tablet by mouth daily.      . mycophenolate (MYFORTIC) 180 MG EC tablet Take 720 mg by mouth 2 (two) times daily.       Marland Kitchen omeprazole (PRILOSEC) 20 MG capsule Take 1 capsule (20 mg total) by mouth daily.  30 capsule  5  . potassium chloride SA (K-DUR,KLOR-CON) 20 MEQ tablet Take 40 mEq by mouth 2 (two) times daily.      Marland Kitchen  sulfamethoxazole-trimethoprim (BACTRIM,SEPTRA) 400-80 MG per tablet Take 1 tablet by mouth every Monday, Wednesday, and Friday. Transplant patient      . tacrolimus (PROGRAF) 1 MG capsule Take 1 mg by mouth 2 (two) times daily.        No current facility-administered medications on file prior to visit.     Past Medical History  Diagnosis Date  . Hypertension   . Hyperlipidemia     low since renal failure  . Kidney transplant status, cadaveric 2012  . Depression   . GERD (gastroesophageal reflux disease)   . Diabetes mellitus   . Gout   . Asthma   . ESRD (end stage renal disease) 2005    Dialysis then renal transplant  . CAD (coronary artery disease) 2005    had CABG     Past Surgical History  Procedure Laterality Date  . Kidney transplant  09/13/2010    cadaver--at [facility]  . Tympanic membrane repair  1/12    left  . Dg angio av shunt*l*       right and left upper arms  . Fasciotomy  03/03/2012    Procedure: FASCIOTOMY;  Surgeon: [name], MD;  Location: [place];  Service: Orthopedics;  Laterality: Right;  FASCIOTOMY RIGHT SMALL FINGER  . Kidney transplant    . Cardiac catheterization      [facility]  . Coronary artery bypass graft  2005    X 4     Family History  Problem Relation Age of Onset  . Heart disease Father   . Diabetes Maternal Grandmother   . Breast cancer Maternal Grandmother   . Liver cancer Paternal Uncle   . Liver cancer Paternal Grandmother      History   Social History  . Marital Status: Married    Spouse Name: N/A    Number of Children: 2  . Years of Education: N/A   Occupational History  . 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 Smoking status: Never Smoker   . Smokeless tobacco: Never Used  . Alcohol Use: Yes     Comment: occasional  . Drug Use: No  . Sexual Activity: Not on file   Other Topics Concern  . Not on file   Social History Narrative   Has living will   Wife is health care POA   Would accept resuscitation attempts   Hasn't considered tube feedings      PHYSICAL EXAM   BP 158/85  Pulse 81  Ht 5' 10.5" (1.791 m)  Wt 329 lb 8 oz (149.46 kg)  BMI 46.59 kg/m2 Constitutional: He is oriented to person, place, and time. He appears well-developed and well-nourished. No distress.  HENT: No nasal discharge.  Head: Normocephalic and atraumatic.  Eyes: Pupils are equal and round.  No discharge. Neck: Normal range of motion. Neck supple. No JVD present. No thyromegaly present.  Cardiovascular: Normal rate, regular rhythm, normal heart sounds. Exam reveals no gallop and no friction rub. No murmur heard.  Pulmonary/Chest: Effort normal and breath sounds normal. No stridor. No respiratory distress. He has no wheezes. He has no rales. He exhibits no tenderness.  Abdominal: Soft. Bowel sounds are normal. He  exhibits no distension. There is no tenderness. There is no rebound and no guarding.  Musculoskeletal: Normal range of motion. He exhibits no edema and no tenderness.  Neurological: He is alert and oriented to person, place, and time. Coordination  normal.  Skin: Skin is warm and dry. No rash noted. He is not diaphoretic. No erythema. No pallor.  Psychiatric: He has a normal mood and affect. His behavior is normal. Judgment and thought content normal.       JYN:WGNFAO sinus rhythm with sinus arrhythmia Voltage criteria for LVH  (R(aVF) exceeds 2.00 mV).   -Right bundle branch block and right axis -possible right ventricular hypertrophy  -consider pulmonary disease.   -Seen with biventricular hypertrophy (strain) or digitalis effect.   ABNORMAL    ASSESSMENT AND PLAN

## 2013-04-17 NOTE — Assessment & Plan Note (Signed)
The patient has known history of coronary artery disease with previous CABG. Currently has no symptoms of angina. Functional capacity is good. Most recent ischemic evaluation in February of 2014 showed no evidence of ischemia. Thus, he has no contraindication for weight loss surgery. His ejection fraction was reported to be 31% on nuclear stress test. Thus, I will request an echocardiogram to evaluate this. If the echocardiogram does not show any significant valvular abnormalities, the patient can proceed with surgery at an overall moderate risk considering his multiple comorbidities.

## 2013-04-17 NOTE — Assessment & Plan Note (Signed)
Blood pressure is elevated but reports low blood pressure readings recently. Continue to monitor.

## 2013-04-17 NOTE — Assessment & Plan Note (Signed)
He reports no recurrent chest pain. Continue medical therapy.

## 2013-04-22 ENCOUNTER — Ambulatory Visit (HOSPITAL_COMMUNITY)
Admission: RE | Admit: 2013-04-22 | Discharge: 2013-04-22 | Disposition: A | Discharge status: Home / Self Care | Payer: Medicare Other | Source: Ambulatory Visit | Attending: Gastroenterology | Admitting: Gastroenterology

## 2013-04-22 ENCOUNTER — Encounter (HOSPITAL_COMMUNITY): Payer: Self-pay | Admitting: Anesthesiology

## 2013-04-22 ENCOUNTER — Encounter (HOSPITAL_COMMUNITY)
Admission: RE | Disposition: A | Discharge status: Home / Self Care | Payer: Self-pay | Source: Ambulatory Visit | Attending: Gastroenterology

## 2013-04-22 ENCOUNTER — Ambulatory Visit (HOSPITAL_COMMUNITY): Payer: Medicare Other | Admitting: Anesthesiology

## 2013-04-22 ENCOUNTER — Encounter (HOSPITAL_COMMUNITY): Payer: Medicare Other | Admitting: Anesthesiology

## 2013-04-22 DIAGNOSIS — I509 Heart failure, unspecified: Secondary | ICD-10-CM | POA: Insufficient documentation

## 2013-04-22 DIAGNOSIS — D378 Neoplasm of uncertain behavior of other specified digestive organs: Secondary | ICD-10-CM

## 2013-04-22 DIAGNOSIS — D371 Neoplasm of uncertain behavior of stomach: Secondary | ICD-10-CM | POA: Insufficient documentation

## 2013-04-22 DIAGNOSIS — K620 Anal polyp: Secondary | ICD-10-CM | POA: Insufficient documentation

## 2013-04-22 DIAGNOSIS — E785 Hyperlipidemia, unspecified: Secondary | ICD-10-CM | POA: Insufficient documentation

## 2013-04-22 DIAGNOSIS — K621 Rectal polyp: Secondary | ICD-10-CM

## 2013-04-22 DIAGNOSIS — K5730 Diverticulosis of large intestine without perforation or abscess without bleeding: Secondary | ICD-10-CM

## 2013-04-22 DIAGNOSIS — E119 Type 2 diabetes mellitus without complications: Secondary | ICD-10-CM | POA: Insufficient documentation

## 2013-04-22 DIAGNOSIS — K219 Gastro-esophageal reflux disease without esophagitis: Secondary | ICD-10-CM | POA: Insufficient documentation

## 2013-04-22 DIAGNOSIS — D126 Benign neoplasm of colon, unspecified: Secondary | ICD-10-CM

## 2013-04-22 DIAGNOSIS — G4730 Sleep apnea, unspecified: Secondary | ICD-10-CM | POA: Insufficient documentation

## 2013-04-22 DIAGNOSIS — R195 Other fecal abnormalities: Secondary | ICD-10-CM

## 2013-04-22 DIAGNOSIS — Z794 Long term (current) use of insulin: Secondary | ICD-10-CM | POA: Insufficient documentation

## 2013-04-22 DIAGNOSIS — Z940 Kidney transplant status: Secondary | ICD-10-CM | POA: Insufficient documentation

## 2013-04-22 DIAGNOSIS — D375 Neoplasm of uncertain behavior of rectum: Secondary | ICD-10-CM

## 2013-04-22 DIAGNOSIS — N186 End stage renal disease: Secondary | ICD-10-CM | POA: Insufficient documentation

## 2013-04-22 DIAGNOSIS — I2510 Atherosclerotic heart disease of native coronary artery without angina pectoris: Secondary | ICD-10-CM | POA: Insufficient documentation

## 2013-04-22 DIAGNOSIS — Z1211 Encounter for screening for malignant neoplasm of colon: Secondary | ICD-10-CM

## 2013-04-22 DIAGNOSIS — I120 Hypertensive chronic kidney disease with stage 5 chronic kidney disease or end stage renal disease: Secondary | ICD-10-CM | POA: Insufficient documentation

## 2013-04-22 DIAGNOSIS — Z951 Presence of aortocoronary bypass graft: Secondary | ICD-10-CM | POA: Insufficient documentation

## 2013-04-22 HISTORY — DX: Diverticulosis of large intestine without perforation or abscess without bleeding: K57.30

## 2013-04-22 HISTORY — PX: COLONOSCOPY WITH PROPOFOL: SHX5780

## 2013-04-22 HISTORY — DX: Sleep apnea, unspecified: G47.30

## 2013-04-22 HISTORY — DX: Pneumonia, unspecified organism: J18.9

## 2013-04-22 LAB — GLUCOSE, CAPILLARY: Glucose-Capillary: 131 mg/dL — ABNORMAL HIGH (ref 70–99)

## 2013-04-22 SURGERY — COLONOSCOPY WITH PROPOFOL
Anesthesia: Monitor Anesthesia Care

## 2013-04-22 MED ORDER — SODIUM CHLORIDE 0.9 % IV SOLN
INTRAVENOUS | Status: DC | PRN
  Administered 2013-04-22: 07:00:00 via INTRAVENOUS

## 2013-04-22 MED ORDER — PROPOFOL 10 MG/ML IV BOLUS
INTRAVENOUS | Status: DC | PRN
  Administered 2013-04-22: 50 mg via INTRAVENOUS
  Administered 2013-04-22: 25 mg via INTRAVENOUS
  Administered 2013-04-22: 50 mg via INTRAVENOUS
  Administered 2013-04-22 (×2): 25 mg via INTRAVENOUS

## 2013-04-22 MED ORDER — PROPOFOL 10 MG/ML IV BOLUS
INTRAVENOUS | Status: AC
  Filled 2013-04-22: qty 20

## 2013-04-22 MED ORDER — LACTATED RINGERS IV SOLN: INTRAVENOUS | Status: DC

## 2013-04-22 MED ORDER — PROMETHAZINE HCL 25 MG/ML IJ SOLN: 6.2500 mg | INTRAMUSCULAR | Status: DC | PRN

## 2013-04-22 MED ORDER — SODIUM CHLORIDE 0.9 % IV SOLN: INTRAVENOUS | Status: DC

## 2013-04-22 SURGICAL SUPPLY — 21 items

## 2013-04-22 NOTE — Anesthesia Preprocedure Evaluation (Signed)
Anesthesia Evaluation  Patient identified by MRN, date of birth, ID band Patient awake    Reviewed: Allergy & Precautions, H&P , NPO status , Patient's Chart, lab work & pertinent test results  Airway Mallampati: II TM Distance: >3 FB Neck ROM: Full    Dental no notable dental hx.    Pulmonary neg pulmonary ROS,  breath sounds clear to auscultation  + decreased breath sounds      Cardiovascular hypertension, Pt. on medications + CAD and + CABG Rhythm:Regular Rate:Normal     Neuro/Psych negative neurological ROS  negative psych ROS   GI/Hepatic negative GI ROS, Neg liver ROS,   Endo/Other  diabetes, Insulin DependentMorbid obesity  Renal/GU Kidney transplant status, cadaveric 2012   negative genitourinary   Musculoskeletal negative musculoskeletal ROS (+)   Abdominal   Peds negative pediatric ROS (+)  Hematology negative hematology ROS (+)   Anesthesia Other Findings   Reproductive/Obstetrics negative OB ROS                           Anesthesia Physical Anesthesia Plan  ASA: III  Anesthesia Plan: MAC   Post-op Pain Management:    Induction: Intravenous  Airway Management Planned: Nasal Cannula  Additional Equipment:   Intra-op Plan:   Post-operative Plan:   Informed Consent: I have reviewed the patients History and Physical, chart, labs and discussed the procedure including the risks, benefits and alternatives for the proposed anesthesia with the patient or authorized representative who has indicated his/her understanding and acceptance.   Dental advisory given  Plan Discussed with: CRNA and Surgeon  Anesthesia Plan Comments:         Anesthesia Quick Evaluation

## 2013-04-22 NOTE — Transfer of Care (Signed)
Immediate Anesthesia Transfer of Care Note  Patient: [name]  Procedure(s) Performed: Procedure(s): COLONOSCOPY WITH PROPOFOL (N/A)  Patient Location: PACU  Anesthesia Type:MAC  Level of Consciousness: awake, alert  and patient cooperative  Airway & Oxygen Therapy: Patient Spontanous Breathing and Patient connected to face mask oxygen  Post-op Assessment: Report given to PACU RN and Post -op Vital signs reviewed and stable  Post vital signs: Reviewed and stable  Complications: No apparent anesthesia complications

## 2013-04-22 NOTE — Anesthesia Postprocedure Evaluation (Signed)
  Anesthesia Post-op Note  Patient: [name]  Procedure(s) Performed: Procedure(s) (LRB): COLONOSCOPY WITH PROPOFOL (N/A)  Patient Location: PACU  Anesthesia Type: MAC  Level of Consciousness: awake and alert   Airway and Oxygen Therapy: Patient Spontanous Breathing  Post-op Pain: mild  Post-op Assessment: Post-op Vital signs reviewed, Patient's Cardiovascular Status Stable, Respiratory Function Stable, Patent Airway and No signs of Nausea or vomiting  Last Vitals:  Filed Vitals:   04/22/13 0819  BP: 136/72  Pulse:   Temp:   Resp: 20    Post-op Vital Signs: stable   Complications: No apparent anesthesia complications

## 2013-04-22 NOTE — H&P (Signed)
  HPI: This is a man with multiple comobidities, referred for colon cancer screening last month. Cologuard stool test was positive.  Here now for colonoscopy    Past Medical History  Diagnosis Date  . Hypertension   . Hyperlipidemia     low since renal failure  . Kidney transplant status, cadaveric 2012  . Depression   . GERD (gastroesophageal reflux disease)   . Diabetes mellitus   . Gout   . Asthma   . ESRD (end stage renal disease) 2005    Dialysis then renal transplant  . CAD (coronary artery disease) 2005    had CABG  . CHF (congestive heart failure)   . Sleep apnea   . Pneumonia     Past Surgical History  Procedure Laterality Date  . Kidney transplant  09/13/2010    cadaver--at [facility]  . Tympanic membrane repair  1/12    left  . Dg angio av shunt*l*      right and left upper arms  . Fasciotomy  03/03/2012    Procedure: FASCIOTOMY;  Surgeon: [name], MD;  Location: [place];  Service: Orthopedics;  Laterality: Right;  FASCIOTOMY RIGHT SMALL FINGER  . Kidney transplant    . Cardiac catheterization      [facility]  . Coronary artery bypass graft  2005    X 4    Current Facility-Administered Medications  Medication Dose Route Frequency Provider Last Rate Last Dose  . 0.9 %  sodium chloride infusion   Intravenous Continuous [name], MD      . lactated ringers infusion   Intravenous Continuous [name], MD      . promethazine [facility]) injection 6.25-12.5 mg  6.25-12.5 mg Intravenous Q15 min PRN Myrtie Soman, MD        Allergies as of 04/02/2013 - Review Complete 03/01/2013  Allergen Reaction Noted  . Morphine  03/31/2012    Family History  Problem Relation Age of Onset  . Heart disease Father   . Diabetes Maternal Grandmother   . Breast cancer Maternal Grandmother   . Liver cancer Paternal Uncle   . Liver cancer Paternal Grandmother     History   Social History  . Marital Status: Married    Spouse Name: N/A    Number  of Children: 2  . Years of Education: N/A   Occupational History  . 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 Smoking status: Never Smoker   . Smokeless tobacco: Never Used  . Alcohol Use: Yes     Comment: occasional  . Drug Use: No  . Sexual Activity: Not on file   Other Topics Concern  . Not on file   Social History Narrative   Has living will   Wife is health care POA   Would accept resuscitation attempts   Hasn't considered tube feedings      Physical Exam: Pulse 69  Temp(Src) 98.1 F (36.7 C) (Oral)  Resp 12  SpO2 93% Constitutional: generally well-appearing Psychiatric: alert and oriented x3 Abdomen: soft, nontender, nondistended, no obvious ascites, no peritoneal signs, normal bowel sounds     Assessment and plan: 53 y.o. male with elevated risk for colon cancer, + cologuard screening test   For colonoscopy today

## 2013-04-22 NOTE — Discharge Instructions (Signed)

## 2013-04-22 NOTE — Op Note (Signed)
[facility] [place], [address]   COLONOSCOPY PROCEDURE REPORT  PATIENT: Prestyn, Stanco  MR#: [id number] BIRTHDATE: [birth year] , 52  yrs. old GENDER: Male ENDOSCOPIST: [name], MD REFERRED SF:[id number] Silvio Pate, M.D. PROCEDURE DATE:  04/22/2013 PROCEDURE:   Colonoscopy with snare polypectomy First Screening Colonoscopy - Avg.  risk and is 50 yrs.  old or older - No.  Prior Negative Screening - Now for repeat screening. N/A  History of Adenoma - Now for follow-up colonoscopy & has been > or = to 3 yrs.  N/A  Polyps Removed Today? Yes. ASA CLASS:   Class III INDICATIONS:Cologuard + stool testing. MEDICATIONS: MAC sedation, administered by CRNA  DESCRIPTION OF PROCEDURE:   After the risks benefits and alternatives of the procedure were thoroughly explained, informed consent was obtained.  A digital rectal exam revealed no abnormalities of the rectum.   The EC-[id number] ([id number])  endoscope was introduced through the anus and advanced to the cecum, which was identified by both the appendix and ileocecal valve. No adverse events experienced.   The quality of the prep was good.  The instrument was then slowly withdrawn as the colon was fully examined.   COLON FINDINGS: Four polyps were found, removed and all were sent to pathology.  These were all sessile, 2-38m across, located in cecum, ascending and rectum segments, all were removed with cold snare. There were diverticulum throughout the colon.  The examination was otherwise normal.  Retroflexed views revealed no abnormalities. The time to cecum=3 minutes 00 seconds.  Withdrawal time=10 minutes 00 seconds.  The scope was withdrawn and the procedure completed. COMPLICATIONS: There were no complications.  ENDOSCOPIC IMPRESSION: Four polyps were found, removed and all were sent to pathology. These were all sessile, 2-575macross, located in cecum, ascending and rectum segments, all were removed  with cold snare.  There were diverticulum throughout the colon.  The examination was otherwise normal.  RECOMMENDATIONS: If the polyp(s) removed today are proven to be adenomatous (pre-cancerous) polyps, you will need a colonoscopy in 3-5 years. Otherwise you should continue to follow colorectal cancer screening guidelines for "routine risk" patients with a colonoscopy in 10 years.  You will receive a letter within 1-2 weeks with the results of your biopsy as well as final recommendations.  Please call my office if you have not received a letter after 3 weeks.   eSigned:  DaMilus BanisterMD 04/22/2013 8:08 AM   cc: Dr. TyDuke SalviaBariatric Surgeon)

## 2013-04-23 ENCOUNTER — Encounter (HOSPITAL_COMMUNITY): Payer: Self-pay | Admitting: Gastroenterology

## 2013-04-25 ENCOUNTER — Encounter: Payer: Self-pay | Admitting: Pulmonary Disease

## 2013-04-27 ENCOUNTER — Other Ambulatory Visit (INDEPENDENT_AMBULATORY_CARE_PROVIDER_SITE_OTHER): Payer: 59

## 2013-04-27 ENCOUNTER — Other Ambulatory Visit: Payer: Self-pay

## 2013-04-27 DIAGNOSIS — I2510 Atherosclerotic heart disease of native coronary artery without angina pectoris: Secondary | ICD-10-CM

## 2013-04-27 DIAGNOSIS — E669 Obesity, unspecified: Secondary | ICD-10-CM

## 2013-04-27 DIAGNOSIS — G4733 Obstructive sleep apnea (adult) (pediatric): Secondary | ICD-10-CM

## 2013-04-27 DIAGNOSIS — R0989 Other specified symptoms and signs involving the circulatory and respiratory systems: Secondary | ICD-10-CM

## 2013-04-27 DIAGNOSIS — R0600 Dyspnea, unspecified: Secondary | ICD-10-CM

## 2013-04-27 DIAGNOSIS — R0609 Other forms of dyspnea: Secondary | ICD-10-CM

## 2013-04-27 DIAGNOSIS — I25810 Atherosclerosis of coronary artery bypass graft(s) without angina pectoris: Secondary | ICD-10-CM

## 2013-04-27 MED ORDER — PERFLUTREN PROTEIN A MICROSPH IV SUSP
1.0000 mL | Freq: Once | INTRAVENOUS | Status: AC
  Administered 2013-04-27: 3 mL via INTRAVENOUS

## 2013-04-27 MED ORDER — PERFLUTREN PROTEIN A MICROSPH IV SUSP: 1.0000 mL | Freq: Once | INTRAVENOUS | Status: DC

## 2013-05-03 ENCOUNTER — Ambulatory Visit: Payer: Self-pay | Admitting: Bariatrics

## 2013-05-12 ENCOUNTER — Other Ambulatory Visit: Payer: Self-pay

## 2013-05-12 ENCOUNTER — Telehealth: Payer: Self-pay | Admitting: Pulmonary Disease

## 2013-05-12 NOTE — Telephone Encounter (Signed)
He might need BiPAP.  He will need to have ROV to discuss in more detail.  There are several measures that need to be assessed prior to prescribing BiPAP to ensure insurance approval for BiPAP.

## 2013-05-12 NOTE — Telephone Encounter (Signed)
Called spoke with patient who reported that his kidney transplant team at [facility] had informed him in the past that his CO2 levels were high and then he had an ABG drawn this morning at [facility] and was told the same thing.  Pt stated that his transplant team suggests a BiPAP machine and that TP had mentioned this as well at last ov.  Pt stated that Resp was to fax his labs for VS to review.  Advised pt will call [facility] for specific lab values and we will call him with any recs from VS.  Last ov 12.8.14 w/ TP and possible BiPAP was discussed with patient at that ov Pt is due for ov w/ VS in March, but none has been scheduled  Called respiratory at [facility] and spoke with [place] who provided the following info: [place] = 7.41, CO2 = 70,  PO2 <43, with a sat of 81% on RA right at entering the exam room.  Sat on RA at rest was 85%; pt was symptomatic.   Labs were faxed to the triage fax machine and received.  Labs walked to [place] and placed in [employer].  Dr Halford Chessman please advise, thank you.

## 2013-05-12 NOTE — Telephone Encounter (Signed)
Called, spoke with pt.  Explained below per VS to him.  He verbalized understanding.  Pt cannot come in tomorrow d/t weather.  Dr. Halford Chessman, your first available after tomorrow is March 12.  Pt doesn't want to wait this long.  Pls advise.  Thank you.  ** Pt ok with call back tomorrow.

## 2013-05-13 DIAGNOSIS — E873 Alkalosis: Secondary | ICD-10-CM | POA: Insufficient documentation

## 2013-05-13 NOTE — Telephone Encounter (Signed)
LMTCB

## 2013-05-13 NOTE — Telephone Encounter (Signed)
Please check which type of insurance he has.  For medicare/medicaid pts the following criteria need to be meet for him to qualify for BiPAP therapy based on his diagnosis of obstructive sleep apnea and sleep related hypoventilation:  1) An ABG while awake in usual resting state (ABG from hospital will not qualify since not usual resting state).  If he is using oxygen during the day, then he should wear his usual amount of oxygen when getting ABG checked. 2) Spirometry showing no evidence for COPD. 3) An in lab sleep study showing his oxygen is < 88% for more than 5 minutes that is not associated with airflow obstruction.  If he is agreeable, then please order 1) ABG with the patient using his prescribed amount of oxygen (if he is using oxygen during the day), 2) Spirometry, 3) CPAP titration study starting with room air and then change to BiPAP +/- supplemental oxygen as needed.  Will then call pt as these results become available, and discuss whether he is a candidate for BiPAP therapy.  Based on this will then determine when he would need ROV.

## 2013-05-13 NOTE — Telephone Encounter (Signed)
ONO is not sufficient based on medicare criteria.  He needs in lab study.  His PFT showed FEV1 < 50% predicted.  Per medicare guidelines he needs to have FEV1 > 50%.  He will need to have repeat spirometry.  If ABG was done in chronic, stable condition then he will not have to repeat this.

## 2013-05-13 NOTE — Telephone Encounter (Signed)
I called and spoke with the pt  He has medicare and [employer] ins  He reports had ABG done yesterday and has already had PFT before and after and sleep study was done in the past He also has had ONO So can we go ahead and order BIPAP? Please advise thanks!

## 2013-05-17 NOTE — Telephone Encounter (Signed)
Laureen RT @ pulm rehab @ [facility].  Calling to discuss patient.  [phone]

## 2013-05-17 NOTE — Telephone Encounter (Signed)
Spoke with [name]; wanted to let us know that patients O2 levels have been all over the place. Blood Gas was done last week there. VS we need to know when to add this patient in asap!!! I have put patient on Wednesday 05-19-13 schedule of VS. I had to leave a message regarding this and requested patient call and confirm this appt tomorrow. Will forward signed message to VS as well.

## 2013-05-17 NOTE — Telephone Encounter (Signed)
Or if you can't reach [place] call steven  [phone]

## 2013-05-19 ENCOUNTER — Encounter: Payer: Self-pay | Admitting: Pulmonary Disease

## 2013-05-19 ENCOUNTER — Ambulatory Visit (INDEPENDENT_AMBULATORY_CARE_PROVIDER_SITE_OTHER): Payer: 59 | Admitting: Pulmonary Disease

## 2013-05-19 VITALS — BP 130/82 | HR 86

## 2013-05-19 DIAGNOSIS — R5381 Other malaise: Secondary | ICD-10-CM | POA: Insufficient documentation

## 2013-05-19 DIAGNOSIS — E662 Morbid (severe) obesity with alveolar hypoventilation: Secondary | ICD-10-CM

## 2013-05-19 DIAGNOSIS — G4733 Obstructive sleep apnea (adult) (pediatric): Secondary | ICD-10-CM

## 2013-05-19 DIAGNOSIS — J45998 Other asthma: Secondary | ICD-10-CM

## 2013-05-19 DIAGNOSIS — I2510 Atherosclerotic heart disease of native coronary artery without angina pectoris: Secondary | ICD-10-CM

## 2013-05-19 DIAGNOSIS — J45909 Unspecified asthma, uncomplicated: Secondary | ICD-10-CM

## 2013-05-19 NOTE — Assessment & Plan Note (Signed)
This is likely contributing to his dyspnea.

## 2013-05-19 NOTE — Assessment & Plan Note (Signed)
Continue 2 liters oxygen 24/7 for now.  Will re-assess his home oxygen set up after review of his BiPAP titration study.

## 2013-05-19 NOTE — Assessment & Plan Note (Addendum)
He is being evaluated for gastric bypass surgery.  Clearly his weight is contributing to much of his respiratory conditions, and he would likely benefit significantly from weight loss.  He was recently evaluated by cardiology, and advised he could proceed with surgery.  Would like to optimize his regimen for sleep disordered breathing first before determining his fitness for bariatric surgery.

## 2013-05-19 NOTE — Assessment & Plan Note (Signed)
He has tried CPAP and supplemental oxygen.  He has tried mask refitting.  He has tried mask and CPAP desensitization regimens.  I think he would be better served with BiPAP therapy.    To further assess appropriate set up will arrange for BiPAP titration study in lab and then decide optimal supplemental oxygen setting at night.  He may also need back up rate for his device.

## 2013-05-19 NOTE — Progress Notes (Signed)
Chief Complaint  Patient presents with  . Acute Visit    Presents today to discuss decreased oxygen levels. Has trouble finished pulm rehab due to SOB.    PRIMARY NEPHROLOGIST: [name], M.D., Dr [name] at [facility]. ENDOCRINOLOGIST: [name]. Solum, M.D. in [place], [place].  History of Present Illness: [name] is a 53 y.o. male with OSA/OHS.  He has hx of renal transplant, asthma and combined heart failure.  He is being evaluated by Dr. Duke [name] for gastric bypass.  He is now being followed by Dr. Pati [name] with nephrology at [facility].  He has noticed more trouble with his breathing during activity.  He is not wearing 2 liters oxygen 24/7.  He denies cough, wheeze, or sputum.  He is using CPAP every night, but he still has trouble breathing at night.  He still feels sleepy during the day.  He has tried adjusting his mask.  He was seen by [name] with cardiology.  He had Echo done recently which showed combined heart failure.  Tests: PSG 03/15/12 >> AHI 57.9, low SpO2 78%.  CPAP 12 cm H2O CT chest [facility]) 09/24/12 >> central bronchial thickening, LUL pleural scarring V/Q scan [facility]) 09/24/12 >> matched defect Lt upper lobe ABG [facility]) 09/25/12 >> pH 7.40, PaCO2 68.2, PaO2 61.5  Echo bubble study [name]) 09/26/12 >> poor study, no identified shunt Room air SpO2 10/27/12 >> 87%, start 2 liters oxygen 24/7 ONO with CPAP and RA 11/02/12 >> Test time 1 hr 22 min. Mean SpO2 76%, low SpO2 59%. Spent 1 hr 16 min with SpO2 < 88%. CPAP 10/15/12 to 11/13/12 >> used on 30 of 30 nights with average 4 hrs 22 min. Average AHI 3.8 with CPAP 12 cm H2O. PFT 11/27/12 >> FEV1 1.74, FEV1% 80%, TLC 4.16 (60%), DLCO 81%, +BD CPAP 11/16/12 to 12/15/12 >> Used on 30 of 30 nights with average 3 hrs 56 min. Average AHI 3.7 with CPAP 12 cm H2O. ONO with CPAP and 2 liters 11/27/12 >> Test time 4 hrs 39 min. Basal SpO2 88%, low SpO2 67%. Spent 117 min with SpO2 < 88% Echo 04/27/13 >> EF 35 to  40%, mod LVH, grade 2 diastolic dysfunction, severe LA dilation, mod RA dilation ABG on room air 05/12/13 >> pH 7.41, PaCO2 70, PaO2 < [name]  has a past medical history of Hypertension; Hyperlipidemia; Kidney transplant status, cadaveric (2012); Depression; GERD (gastroesophageal reflux disease); Diabetes mellitus; Gout; Asthma; ESRD (end stage renal disease) (2005); CAD (coronary artery disease) (2005); CHF (congestive heart failure); Sleep apnea; and Pneumonia.  [name]  has past surgical history that includes Kidney transplant (09/13/2010); Tympanic membrane repair (1/12); dg angio av shunt*l*; Fasciotomy (03/03/2012); Kidney transplant; Cardiac catheterization; Coronary artery bypass graft (2005); and Colonoscopy with propofol (N/A, 04/22/2013).  Prior to Admission medications   Medication Sig Start Date End Date Taking? Authorizing Provider  aspirin 81 MG tablet Take 81 mg by mouth daily.   Yes Historical Provider, MD  buPROPion (WELLBUTRIN XL) 150 MG 24 hr tablet Take 150 mg by mouth daily.   Yes Historical Provider, MD  colchicine 0.6 MG tablet Take 1 tablet (0.6 mg total) by mouth daily as needed (gout). 09/11/12  Yes Tonia Ghent, MD  furosemide (LASIX) 40 MG tablet Take 80 mg by mouth daily.  10/13/12  Yes Historical Provider, MD  glipiZIDE (GLUCOTROL) 10 MG tablet Take 1 tablet (10 mg total) by mouth 2 (two) times daily before  a meal. 10/26/12  Yes [name], MD  glucose blood (BAYER CONTOUR NEXT TEST) test strip Check blood glucose three times daily and as directed. 10/15/11  Yes [name], MD  insulin aspart (NOVOLOG) 100 UNIT/ML injection Inject 30 Units into the skin 3 (three) times daily before meals. 02/06/12 02/05/13 Yes [name], MD  insulin glargine (LANTUS) 100 UNIT/ML injection Inject 130 Units into the skin at bedtime.  12/27/11  Yes [name], MD  Insulin Pen Needle 31G X 8 MM MISC Inject as directed 4 times daily as needed 04/22/11   Yes [name], MD  labetalol (NORMODYNE) 300 MG tablet Take 150-300 mg by mouth 2 (two) times daily.    Yes Historical Provider, MD  metolazone (ZAROXOLYN) 2.5 MG tablet Take 2.5 mg by mouth daily.     Yes Historical Provider, MD  mometasone (NASONEX) 50 MCG/ACT nasal spray Place 2 sprays into the nose daily. 11/15/11  Yes [name], MD  mycophenolate (MYFORTIC) 180 MG EC tablet Take 720 mg by mouth 2 (two) times daily.    Yes Historical Provider, MD  omeprazole (PRILOSEC) 20 MG capsule Take 1 capsule (20 mg total) by mouth daily. 03/04/12  Yes [name], MD  potassium chloride (KLOR-CON) 20 MEQ packet Take 60 mEq by mouth 3 (three) times daily.    Yes Historical Provider, MD  sildenafil (VIAGRA) 100 MG tablet Take 0.5-1 tablets (50-100 mg total) by mouth daily as needed for erectile dysfunction. 10/20/12  Yes [name], MD  tacrolimus (PROGRAF) 1 MG capsule Take 1 mg by mouth 2 (two) times daily.    Yes Historical Provider, MD  tadalafil (CIALIS) 20 MG tablet Take 0.5-1 tablets (10-20 mg total) by mouth every other day as needed for erectile dysfunction. 10/20/12  Yes [name], MD    Allergies  Allergen Reactions  . Morphine     MORPHINE, puritis    Physical Exam:  General - No distress ENT - No sinus tenderness, no oral exudate, MP 4, enlarged tongue Cardiac - s1s2 regular, no murmur Chest - Decreased breath sounds, no wheeze/rales Back - No focal tenderness Abd - Soft, non-tender Ext - No edema, old AV grafts in Rt upper arm and Lt forearm Neuro - Normal strength Skin - No rashes Psych - Normal mood, and behavior  Assessment:  Chesley [name], MD [name] 05/19/2013, 10:31 AM Pager:  [phone] After 3pm call: [phone]

## 2013-05-19 NOTE — Patient Instructions (Signed)
Will arrange for BiPAP titration sleep study Will call to arrange for follow up after sleep study reviewed

## 2013-05-19 NOTE — Assessment & Plan Note (Signed)
This does not appear to be much of an issue at present.  He is to continue Qvar and prn albuterol.

## 2013-05-23 ENCOUNTER — Encounter: Payer: Self-pay | Admitting: Pulmonary Disease

## 2013-05-24 ENCOUNTER — Other Ambulatory Visit: Payer: Self-pay | Admitting: Internal Medicine

## 2013-05-28 ENCOUNTER — Encounter: Payer: Self-pay | Admitting: Internal Medicine

## 2013-06-21 ENCOUNTER — Ambulatory Visit (HOSPITAL_BASED_OUTPATIENT_CLINIC_OR_DEPARTMENT_OTHER): Payer: Medicare Other | Attending: Pulmonary Disease | Admitting: Radiology

## 2013-06-21 VITALS — Ht 70.5 in

## 2013-06-21 DIAGNOSIS — R0683 Snoring: Secondary | ICD-10-CM

## 2013-06-21 DIAGNOSIS — Z9989 Dependence on other enabling machines and devices: Secondary | ICD-10-CM

## 2013-06-21 DIAGNOSIS — R0989 Other specified symptoms and signs involving the circulatory and respiratory systems: Secondary | ICD-10-CM | POA: Insufficient documentation

## 2013-06-21 DIAGNOSIS — E662 Morbid (severe) obesity with alveolar hypoventilation: Secondary | ICD-10-CM

## 2013-06-21 DIAGNOSIS — R0609 Other forms of dyspnea: Secondary | ICD-10-CM | POA: Insufficient documentation

## 2013-06-21 DIAGNOSIS — G4733 Obstructive sleep apnea (adult) (pediatric): Secondary | ICD-10-CM | POA: Insufficient documentation

## 2013-06-23 ENCOUNTER — Encounter: Payer: Self-pay | Admitting: Pulmonary Disease

## 2013-06-27 ENCOUNTER — Telehealth: Payer: Self-pay | Admitting: Pulmonary Disease

## 2013-06-27 DIAGNOSIS — G4733 Obstructive sleep apnea (adult) (pediatric): Secondary | ICD-10-CM

## 2013-06-27 DIAGNOSIS — R0989 Other specified symptoms and signs involving the circulatory and respiratory systems: Secondary | ICD-10-CM

## 2013-06-27 DIAGNOSIS — R0609 Other forms of dyspnea: Secondary | ICD-10-CM

## 2013-06-27 DIAGNOSIS — E662 Morbid (severe) obesity with alveolar hypoventilation: Secondary | ICD-10-CM

## 2013-06-27 NOTE — Telephone Encounter (Signed)
BiPAP 06/21/13 >> 14/9 cm H2O with 2 liters oxygen >> AHI 0.  Will have my nurse inform pt that BiPAP titration went well.  He needs BiPAP 14/9 cm H2O with 2 liters oxygen.  Please arrange for this set up.  He will need ROV 2 months after BiPAP set up.

## 2013-06-27 NOTE — Sleep Study (Signed)
[place]  NAME: [name] DATE OF BIRTH:  October 17, 1960 MEDICAL RECORD NUMBER [id number]  LOCATION: [facility]  PHYSICIAN: Chesley [name], M.D. DATE OF STUDY: 06/21/2013  SLEEP STUDY TYPE: Nocturnal Polysomnogram               REFERRING PHYSICIAN: Chesley [name], MD  INDICATION FOR STUDY:  53 year old male with history of OSA and OHS who has persistent nocturnal hypoxia in spite of compliance with CPAP therapy and supplemental oxygen.  He returns to the sleep lab for a BiPAP titration study.  EPWORTH SLEEPINESS SCORE: 4. HEIGHT: 5' 10.5" (179.1 cm)  WEIGHT:  (334lbs)    Body mass index is 0.00 kg/(m^2).  NECK SIZE: 21 in.  MEDICATIONS:  Current Outpatient Prescriptions on File Prior to Visit  Medication Sig Dispense Refill  . albuterol (PROAIR HFA) 108 (90 BASE) MCG/ACT inhaler Inhale 2 puffs into the lungs 4 (four) times daily as needed for wheezing or shortness of breath (cough).  1 Inhaler  3  . aspirin 81 MG tablet Take 81 mg by mouth daily.      . beclomethasone (QVAR) 80 MCG/ACT inhaler Inhale 2 puffs into the lungs 2 (two) times daily.  1 Inhaler  3  . buPROPion (WELLBUTRIN XL) 150 MG 24 hr tablet Take 150 mg by mouth every morning.       . furosemide (LASIX) 40 MG tablet Take 80 mg by mouth 2 (two) times daily.       Marland Kitchen glipiZIDE (GLUCOTROL) 10 MG tablet Take 1 tablet (10 mg total) by mouth 2 (two) times daily before a meal.  180 tablet  0  . insulin aspart (NOVOLOG) 100 UNIT/ML injection Inject 1-2 Units into the skin as needed.       . insulin glargine (LANTUS) 100 UNIT/ML injection Inject 120 Units into the skin at bedtime.       Marland Kitchen labetalol (NORMODYNE) 300 MG tablet Take 300 mg by mouth 2 (two) times daily.       Marland Kitchen lisinopril (PRINIVIL,ZESTRIL) 5 MG tablet Take 5 mg by mouth at bedtime.      . magnesium oxide (MAGOX 400) 400 (241.3 MG) MG tablet Take 400 mg by mouth 2 (two) times daily.      . metolazone (ZAROXOLYN) 2.5 MG tablet  Take 2.5 mg by mouth daily as needed (for excess fluid).       . Multiple Vitamin (MULTIVITAMIN WITH MINERALS) TABS tablet Take 1 tablet by mouth daily.      . mycophenolate (MYFORTIC) 180 MG EC tablet Take 720 mg by mouth 2 (two) times daily.       Marland Kitchen NASONEX 50 MCG/ACT nasal spray USE 2 SPRAYS IN EACH NOSTRIL DAILY  17 g  11  . omeprazole (PRILOSEC) 20 MG capsule Take 1 capsule (20 mg total) by mouth daily.  30 capsule  5  . potassium chloride SA (K-DUR,KLOR-CON) 20 MEQ tablet Take 40 mEq by mouth 2 (two) times daily.      . tacrolimus (PROGRAF) 1 MG capsule Take 1 mg by mouth 2 (two) times daily.        No current facility-administered medications on file prior to visit.    SLEEP ARCHITECTURE:  Total recording time: 380 minutes.  Total sleep time was: 201 minutes.  Sleep efficiency: 52.9%.  Sleep latency: 33.5 minutes.  REM latency: 194 minutes.  Stage N1: 21.1%.  Stage N2: 57.7%.  Stage N3: 0%.  Stage R:  21.1%.  Supine sleep: 3 minutes.  Non-supine sleep: 198 minutes.  RESPIRATORY DATA: Average respiratory rate: 22. Snoring: Moderate.  He was started on BiPAP 7/3 cm H2O and increased to 14/9 cm H2O.  With BiPAP 14/9 cm H2O the AHI was reduced to 0.  OXYGEN DATA:  Baseline oxygenation: 92%. Lowest SaO2: 82%. Time spent below SaO2 90%: 72.9 minutes. Supplemental oxygen used: 2 liters.  CARDIAC DATA:  Average heart rate: 72 beats per minute. Rhythm strip: Sinus rhythm.  MOVEMENT/PARASOMNIA:  Periodic limb movement: 31.  Period limb movements with arousals: 2.4. Restroom trips: three.  IMPRESSION/ RECOMMENDATION:   This was a successful BiPAP titration study.  He did well with BiPAP 14/9 cm H2O.  He required the use of 2 liters supplemental oxygen while using BiPAP.  He was fitted with a Fisher Paykel Simplus medium size full face mask with large headgear.     Chesley [name], M.D. Diplomate, Tax adviser of Sleep Medicine  ELECTRONICALLY SIGNED ON:  06/27/2013, 4:14  PM [place] PH: [phone]   FX: [phone] [place]

## 2013-06-28 NOTE — Telephone Encounter (Signed)
lmtcb x1 

## 2013-06-28 NOTE — Telephone Encounter (Signed)
Pt is aware of results. Order has been placed. Recall will be placed for 2 month ROV.

## 2013-07-01 ENCOUNTER — Other Ambulatory Visit: Payer: Self-pay

## 2013-07-15 LAB — HM DIABETES EYE EXAM

## 2013-07-22 LAB — BASIC METABOLIC PANEL
Anion Gap: 6 — ABNORMAL LOW (ref 7–16)
BUN: 21 mg/dL — ABNORMAL HIGH (ref 7–18)
CALCIUM: 9.3 mg/dL (ref 8.5–10.1)
CO2: 38 mmol/L — AB (ref 21–32)
Chloride: 95 mmol/L — ABNORMAL LOW (ref 98–107)
Creatinine: 1.13 mg/dL (ref 0.60–1.30)
EGFR (Non-African Amer.): >60
GLUCOSE: 123 mg/dL — AB (ref 65–99)
OSMOLALITY: 282 (ref 275–301)
POTASSIUM: 3.1 mmol/L — AB (ref 3.5–5.1)
Sodium: 139 mmol/L (ref 136–145)

## 2013-07-23 ENCOUNTER — Encounter: Payer: Self-pay | Admitting: Pulmonary Disease

## 2013-07-27 ENCOUNTER — Telehealth: Payer: Self-pay | Admitting: Internal Medicine

## 2013-07-27 MED ORDER — COLCHICINE 0.6 MG PO TABS: 0.6000 mg | ORAL_TABLET | Freq: Two times a day (BID) | ORAL | Status: DC | PRN

## 2013-07-27 NOTE — Telephone Encounter (Signed)
Spoke with patient and advised results rx sent to pharmacy by e-script

## 2013-07-27 NOTE — Telephone Encounter (Signed)
Pt called, having pain from gout.  Stated on a scale of 1-10 pain is a 10.  Offered appointment, pt states he is only able to crawl at this time and would like to have medication for pain.  Pharmacy of choice is [facility].  Best number to call pt is [phone]

## 2013-07-27 NOTE — Telephone Encounter (Signed)
Quickest acting med is colchicine He has had this before  rx 0.86m #60 x 2 1 bid prn for gout He can take a third if pain continues after the first 2 (which can be 1-2 hours apart), but it might cause stomach upset or diarrhea

## 2013-07-28 ENCOUNTER — Ambulatory Visit (INDEPENDENT_AMBULATORY_CARE_PROVIDER_SITE_OTHER): Payer: Medicare Other | Admitting: Internal Medicine

## 2013-07-28 ENCOUNTER — Encounter: Payer: Self-pay | Admitting: Internal Medicine

## 2013-07-28 VITALS — HR 96 | Temp 98.4°F | Wt 327.0 lb

## 2013-07-28 DIAGNOSIS — M109 Gout, unspecified: Secondary | ICD-10-CM

## 2013-07-28 DIAGNOSIS — I2510 Atherosclerotic heart disease of native coronary artery without angina pectoris: Secondary | ICD-10-CM

## 2013-07-28 MED ORDER — HYDROCODONE-ACETAMINOPHEN 5-325 MG PO TABS: 1.0000 | ORAL_TABLET | Freq: Four times a day (QID) | ORAL | Status: DC | PRN

## 2013-07-28 NOTE — Progress Notes (Signed)
Subjective:    Patient ID: [name], male    DOB: [birth year], 53 y.o.   MRN: [id number]  HPI  Pt presents to the clinic today with c/o right ankle and right elbow pain. This started 3 days ago. He is having a gout flare. He has noticed redness, warmth and swelling.  Dr. Silvio Pate called in colchicine yesterday. He has only had 2 doses but reports that it is not effective as it was before.  Review of Systems      Past Medical History  Diagnosis Date  . Hypertension   . Hyperlipidemia     low since renal failure  . Kidney transplant status, cadaveric 2012  . Depression   . GERD (gastroesophageal reflux disease)   . Diabetes mellitus   . Gout   . Asthma   . ESRD (end stage renal disease) 2005    Dialysis then renal transplant  . CAD (coronary artery disease) 2005    had CABG  . CHF (congestive heart failure)   . Sleep apnea   . Pneumonia     Current Outpatient Prescriptions  Medication Sig Dispense Refill  . albuterol (PROAIR HFA) 108 (90 BASE) MCG/ACT inhaler Inhale 2 puffs into the lungs 4 (four) times daily as needed for wheezing or shortness of breath (cough).  1 Inhaler  3  . aspirin 81 MG tablet Take 81 mg by mouth daily.      . beclomethasone (QVAR) 80 MCG/ACT inhaler Inhale 2 puffs into the lungs 2 (two) times daily.  1 Inhaler  3  . buPROPion (WELLBUTRIN XL) 150 MG 24 hr tablet Take 150 mg by mouth every morning.       . colchicine 0.6 MG tablet Take 1 tablet (0.6 mg total) by mouth 2 (two) times daily as needed.  60 tablet  2  . furosemide (LASIX) 40 MG tablet Take 80 mg by mouth 2 (two) times daily.       Marland Kitchen glipiZIDE (GLUCOTROL) 10 MG tablet Take 1 tablet (10 mg total) by mouth 2 (two) times daily before a meal.  180 tablet  0  . insulin aspart (NOVOLOG) 100 UNIT/ML injection Inject 1-2 Units into the skin as needed.       . insulin glargine (LANTUS) 100 UNIT/ML injection Inject 120 Units into the skin at bedtime.       Marland Kitchen labetalol (NORMODYNE) 300 MG tablet  Take 300 mg by mouth 2 (two) times daily.       Marland Kitchen lisinopril (PRINIVIL,ZESTRIL) 5 MG tablet Take 5 mg by mouth at bedtime.      . magnesium oxide (MAGOX 400) 400 (241.3 MG) MG tablet Take 400 mg by mouth 2 (two) times daily.      . metolazone (ZAROXOLYN) 2.5 MG tablet Take 2.5 mg by mouth daily as needed (for excess fluid).       . Multiple Vitamin (MULTIVITAMIN WITH MINERALS) TABS tablet Take 1 tablet by mouth daily.      . mycophenolate (MYFORTIC) 180 MG EC tablet Take 720 mg by mouth 2 (two) times daily.       Marland Kitchen NASONEX 50 MCG/ACT nasal spray USE 2 SPRAYS IN EACH NOSTRIL DAILY  17 g  11  . omeprazole (PRILOSEC) 20 MG capsule Take 1 capsule (20 mg total) by mouth daily.  30 capsule  5  . potassium chloride SA (K-DUR,KLOR-CON) 20 MEQ tablet Take 40 mEq by mouth 2 (two) times daily.      . tacrolimus (PROGRAF) 1  MG capsule Take 1 mg by mouth 2 (two) times daily.       Marland Kitchen HYDROcodone-acetaminophen (NORCO/VICODIN) 5-325 MG per tablet Take 1 tablet by mouth every 6 (six) hours as needed for moderate pain.  30 tablet  0   No current facility-administered medications for this visit.    Allergies  Allergen Reactions  . Morphine     MORPHINE, puritis    Family History  Problem Relation Age of Onset  . Heart disease Father   . Diabetes Maternal Grandmother   . Breast cancer Maternal Grandmother   . Liver cancer Paternal Uncle   . Liver cancer Paternal Grandmother     History   Social History  . Marital Status: Married    Spouse Name: N/A    Number of Children: 2  . Years of Education: N/A   Occupational History  . 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 Smoking status: Never Smoker   . Smokeless tobacco: Never Used  . Alcohol Use: Yes     Comment: occasional  . Drug Use: No  . Sexual Activity: Not on file   Other Topics Concern  . Not on file   Social History Narrative   Has living will   Wife is health care POA   Would  accept resuscitation attempts   Hasn't considered tube feedings     Constitutional: Denies fever, malaise, fatigue, headache or abrupt weight changes.  Musculoskeletal: Pt reports right elbow and right ankle pain. Denies muscle pain.  Skin: Pt reports redness and warmth of joint. No rashes, lesions or ulcercations.    No other specific complaints in a complete review of systems (except as listed in HPI above).  Objective:   Physical Exam    BP   Pulse 96  Temp(Src) 98.4 F (36.9 C) (Oral)  Wt 327 lb (148.326 kg)  SpO2 92% Wt Readings from Last 3 Encounters:  07/28/13 327 lb (148.326 kg)  04/15/13 329 lb 8 oz (149.46 kg)  03/01/13 336 lb (152.409 kg)    General: Appears their stated age, well developed, well nourished in NAD. Skin: Warm, dry and intact. Erythema noted of right elbow and right ankle, warm to touch. Musculoskeletal: Refused to let us exam ROM oh his right elbow or right ankle.  BMET    Component Value Date/Time   NA 138 03/03/2012 0730   K 2.8* 03/03/2012 0730   CL 91* 03/03/2012 0730   GLUCOSE 211* 03/03/2012 0730   BUN 21 03/03/2012 0730   CREATININE 1.20 03/03/2012 0730   CREATININE 1.1 11/01/2011    Lipid Panel     Component Value Date/Time   CHOL 158 04/20/2012 1618   TRIG 655.0* 04/20/2012 1618   HDL 21.80* 04/20/2012 1618   CHOLHDL 7 04/20/2012 1618   VLDL 131.0* 04/20/2012 1618    CBC    Component Value Date/Time   HGB 16.0 03/03/2012 0730   HCT 47.0 03/03/2012 0730    Hgb A1C Lab Results  Component Value Date   HGBA1C 7.7* 09/22/2012       Assessment & Plan:   Gout of right elbow and right ankle:  Discussed with Dr. Silvio Pate Will have him continue colcrys  Give a limited number of hydrocodone  RTC as needed or if symptoms persist or worsen

## 2013-07-28 NOTE — Progress Notes (Signed)
Pre visit review using our clinic review tool, if applicable. No additional management support is needed unless otherwise documented below in the visit note.

## 2013-07-28 NOTE — Patient Instructions (Signed)

## 2013-08-12 ENCOUNTER — Encounter (HOSPITAL_BASED_OUTPATIENT_CLINIC_OR_DEPARTMENT_OTHER): Payer: Self-pay | Admitting: *Deleted

## 2013-08-12 ENCOUNTER — Other Ambulatory Visit: Payer: Self-pay | Admitting: Orthopedic Surgery

## 2013-08-12 NOTE — Progress Notes (Signed)
Pt is a renal transplant at [facility]-recent labs there-sees dr soot for OSA-using bipap with 2l 02 at night-will bring-take am meds with water-will need istat-ekg done 1/15-echo 2/15 Had surgery here 12/13

## 2013-08-13 NOTE — Progress Notes (Signed)
Spoke with Dr Linna Caprice pt cleared for surgery

## 2013-08-17 ENCOUNTER — Encounter (HOSPITAL_BASED_OUTPATIENT_CLINIC_OR_DEPARTMENT_OTHER): Payer: Self-pay | Admitting: *Deleted

## 2013-08-17 ENCOUNTER — Encounter (HOSPITAL_BASED_OUTPATIENT_CLINIC_OR_DEPARTMENT_OTHER): Payer: Medicare Other | Admitting: Certified Registered"

## 2013-08-17 ENCOUNTER — Ambulatory Visit (HOSPITAL_BASED_OUTPATIENT_CLINIC_OR_DEPARTMENT_OTHER)
Admission: RE | Admit: 2013-08-17 | Discharge: 2013-08-17 | Disposition: A | Discharge status: Home / Self Care | Payer: Medicare Other | Source: Ambulatory Visit | Attending: Orthopedic Surgery | Admitting: Orthopedic Surgery

## 2013-08-17 ENCOUNTER — Ambulatory Visit (HOSPITAL_BASED_OUTPATIENT_CLINIC_OR_DEPARTMENT_OTHER): Payer: Medicare Other | Admitting: Certified Registered"

## 2013-08-17 ENCOUNTER — Encounter (HOSPITAL_BASED_OUTPATIENT_CLINIC_OR_DEPARTMENT_OTHER)
Admission: RE | Disposition: A | Discharge status: Home / Self Care | Payer: Self-pay | Source: Ambulatory Visit | Attending: Orthopedic Surgery

## 2013-08-17 DIAGNOSIS — F3289 Other specified depressive episodes: Secondary | ICD-10-CM | POA: Insufficient documentation

## 2013-08-17 DIAGNOSIS — I509 Heart failure, unspecified: Secondary | ICD-10-CM | POA: Insufficient documentation

## 2013-08-17 DIAGNOSIS — Z6841 Body Mass Index (BMI) 40.0 and over, adult: Secondary | ICD-10-CM | POA: Insufficient documentation

## 2013-08-17 DIAGNOSIS — J45909 Unspecified asthma, uncomplicated: Secondary | ICD-10-CM | POA: Insufficient documentation

## 2013-08-17 DIAGNOSIS — Z7982 Long term (current) use of aspirin: Secondary | ICD-10-CM | POA: Insufficient documentation

## 2013-08-17 DIAGNOSIS — Z9861 Coronary angioplasty status: Secondary | ICD-10-CM | POA: Insufficient documentation

## 2013-08-17 DIAGNOSIS — Z885 Allergy status to narcotic agent status: Secondary | ICD-10-CM | POA: Insufficient documentation

## 2013-08-17 DIAGNOSIS — E785 Hyperlipidemia, unspecified: Secondary | ICD-10-CM | POA: Insufficient documentation

## 2013-08-17 DIAGNOSIS — M720 Palmar fascial fibromatosis [Dupuytren]: Secondary | ICD-10-CM | POA: Insufficient documentation

## 2013-08-17 DIAGNOSIS — N186 End stage renal disease: Secondary | ICD-10-CM | POA: Insufficient documentation

## 2013-08-17 DIAGNOSIS — Z79899 Other long term (current) drug therapy: Secondary | ICD-10-CM | POA: Insufficient documentation

## 2013-08-17 DIAGNOSIS — Z794 Long term (current) use of insulin: Secondary | ICD-10-CM | POA: Insufficient documentation

## 2013-08-17 DIAGNOSIS — I2510 Atherosclerotic heart disease of native coronary artery without angina pectoris: Secondary | ICD-10-CM | POA: Insufficient documentation

## 2013-08-17 DIAGNOSIS — Z951 Presence of aortocoronary bypass graft: Secondary | ICD-10-CM | POA: Insufficient documentation

## 2013-08-17 DIAGNOSIS — F329 Major depressive disorder, single episode, unspecified: Secondary | ICD-10-CM | POA: Insufficient documentation

## 2013-08-17 DIAGNOSIS — Z940 Kidney transplant status: Secondary | ICD-10-CM | POA: Insufficient documentation

## 2013-08-17 DIAGNOSIS — G4730 Sleep apnea, unspecified: Secondary | ICD-10-CM | POA: Insufficient documentation

## 2013-08-17 DIAGNOSIS — M109 Gout, unspecified: Secondary | ICD-10-CM | POA: Insufficient documentation

## 2013-08-17 DIAGNOSIS — E119 Type 2 diabetes mellitus without complications: Secondary | ICD-10-CM | POA: Insufficient documentation

## 2013-08-17 DIAGNOSIS — I120 Hypertensive chronic kidney disease with stage 5 chronic kidney disease or end stage renal disease: Secondary | ICD-10-CM | POA: Insufficient documentation

## 2013-08-17 HISTORY — DX: Presence of spectacles and contact lenses: Z97.3

## 2013-08-17 HISTORY — PX: FASCIOTOMY: SHX132

## 2013-08-17 LAB — POCT I-STAT, CHEM 8
BUN: 20 mg/dL (ref 6–23)
CHLORIDE: 93 meq/L — AB (ref 96–112)
Calcium, Ion: 1.06 mmol/L — ABNORMAL LOW (ref 1.12–1.23)
Creatinine, Ser: 1.2 mg/dL (ref 0.50–1.35)
GLUCOSE: 144 mg/dL — AB (ref 70–99)
HEMATOCRIT: 52 % (ref 39.0–52.0)
HEMOGLOBIN: 17.7 g/dL — AB (ref 13.0–17.0)
POTASSIUM: 3.1 meq/L — AB (ref 3.7–5.3)
SODIUM: 141 meq/L (ref 137–147)
TCO2: 38 mmol/L (ref 0–100)

## 2013-08-17 LAB — GLUCOSE, CAPILLARY: Glucose-Capillary: 146 mg/dL — ABNORMAL HIGH (ref 70–99)

## 2013-08-17 SURGERY — FASCIOTOMY, UPPER EXTREMITY
Anesthesia: Monitor Anesthesia Care | Site: Finger | Laterality: Left

## 2013-08-17 MED ORDER — PROPOFOL INFUSION 10 MG/ML OPTIME
INTRAVENOUS | Status: DC | PRN
  Administered 2013-08-17: 75 ug/kg/min via INTRAVENOUS

## 2013-08-17 MED ORDER — CHLORHEXIDINE GLUCONATE 4 % EX LIQD: 60.0000 mL | Freq: Once | CUTANEOUS | Status: DC

## 2013-08-17 MED ORDER — FENTANYL CITRATE 0.05 MG/ML IJ SOLN
INTRAMUSCULAR | Status: AC
  Filled 2013-08-17: qty 6

## 2013-08-17 MED ORDER — DEXTROSE 5 % IV SOLN: 3.0000 g | INTRAVENOUS | Status: DC

## 2013-08-17 MED ORDER — ONDANSETRON HCL 4 MG/2ML IJ SOLN
INTRAMUSCULAR | Status: AC
  Filled 2013-08-17: qty 4

## 2013-08-17 MED ORDER — ONDANSETRON 8 MG/NS 50 ML IVPB
8.0000 mg | Freq: Once | INTRAVENOUS | Status: AC
  Administered 2013-08-17: 8 mg via INTRAVENOUS

## 2013-08-17 MED ORDER — MIDAZOLAM HCL 5 MG/5ML IJ SOLN
INTRAMUSCULAR | Status: DC | PRN
  Administered 2013-08-17: 1 mg via INTRAVENOUS

## 2013-08-17 MED ORDER — BUPIVACAINE HCL (PF) 0.25 % IJ SOLN
INTRAMUSCULAR | Status: AC
  Filled 2013-08-17: qty 30

## 2013-08-17 MED ORDER — ONDANSETRON HCL 4 MG/2ML IJ SOLN: 4.0000 mg | Freq: Once | INTRAMUSCULAR | Status: DC | PRN

## 2013-08-17 MED ORDER — CEFAZOLIN SODIUM 1-5 GM-% IV SOLN
INTRAVENOUS | Status: AC
  Filled 2013-08-17: qty 50

## 2013-08-17 MED ORDER — LIDOCAINE HCL (CARDIAC) 20 MG/ML IV SOLN
INTRAVENOUS | Status: DC | PRN
  Administered 2013-08-17: 30 mg via INTRAVENOUS

## 2013-08-17 MED ORDER — LACTATED RINGERS IV SOLN
INTRAVENOUS | Status: DC
  Administered 2013-08-17 (×2): via INTRAVENOUS

## 2013-08-17 MED ORDER — BUPIVACAINE HCL (PF) 0.25 % IJ SOLN
INTRAMUSCULAR | Status: DC | PRN
  Administered 2013-08-17: 6 mL

## 2013-08-17 MED ORDER — OXYCODONE HCL 5 MG PO TABS: 5.0000 mg | ORAL_TABLET | Freq: Once | ORAL | Status: DC | PRN

## 2013-08-17 MED ORDER — DEXTROSE 5 % IV SOLN
3.0000 g | INTRAVENOUS | Status: AC
  Administered 2013-08-17: 3 g via INTRAVENOUS

## 2013-08-17 MED ORDER — HYDROMORPHONE HCL PF 1 MG/ML IJ SOLN: 0.2500 mg | INTRAMUSCULAR | Status: DC | PRN

## 2013-08-17 MED ORDER — EPHEDRINE SULFATE 50 MG/ML IJ SOLN
INTRAMUSCULAR | Status: DC | PRN
  Administered 2013-08-17 (×2): 10 mg via INTRAVENOUS

## 2013-08-17 MED ORDER — MIDAZOLAM HCL 2 MG/2ML IJ SOLN: 1.0000 mg | INTRAMUSCULAR | Status: DC | PRN

## 2013-08-17 MED ORDER — OXYCODONE HCL 5 MG/5ML PO SOLN: 5.0000 mg | Freq: Once | ORAL | Status: DC | PRN

## 2013-08-17 MED ORDER — FENTANYL CITRATE 0.05 MG/ML IJ SOLN
INTRAMUSCULAR | Status: DC | PRN
  Administered 2013-08-17: 50 ug via INTRAVENOUS

## 2013-08-17 MED ORDER — ONDANSETRON HCL 4 MG/2ML IJ SOLN
INTRAMUSCULAR | Status: DC | PRN
  Administered 2013-08-17: 4 mg via INTRAVENOUS

## 2013-08-17 MED ORDER — CEFAZOLIN SODIUM-DEXTROSE 2-3 GM-% IV SOLR
INTRAVENOUS | Status: AC
  Filled 2013-08-17: qty 50

## 2013-08-17 MED ORDER — MIDAZOLAM HCL 2 MG/2ML IJ SOLN
INTRAMUSCULAR | Status: AC
  Filled 2013-08-17: qty 2

## 2013-08-17 MED ORDER — HYDROCODONE-ACETAMINOPHEN 5-325 MG PO TABS: 1.0000 | ORAL_TABLET | Freq: Four times a day (QID) | ORAL | Status: DC | PRN

## 2013-08-17 MED ORDER — FENTANYL CITRATE 0.05 MG/ML IJ SOLN: 50.0000 ug | INTRAMUSCULAR | Status: DC | PRN

## 2013-08-17 SURGICAL SUPPLY — 45 items
BLADE MINI RND TIP GREEN BEAV (BLADE) ×2 IMPLANT
BLADE SURG 15 STRL LF DISP TIS (BLADE) ×3 IMPLANT
BLADE SURG 15 STRL SS (BLADE) ×6
BNDG CMPR 9X4 STRL LF SNTH (GAUZE/BANDAGES/DRESSINGS) ×1
BNDG COHESIVE 3X5 TAN STRL LF (GAUZE/BANDAGES/DRESSINGS) ×2 IMPLANT
BNDG ESMARK 4X9 LF (GAUZE/BANDAGES/DRESSINGS) ×2 IMPLANT
BNDG GAUZE ELAST 4 BULKY (GAUZE/BANDAGES/DRESSINGS) ×2 IMPLANT
CHLORAPREP W/TINT 26ML (MISCELLANEOUS) ×2 IMPLANT
CORDS BIPOLAR (ELECTRODE) ×2 IMPLANT
COVER MAYO STAND STRL (DRAPES) ×2 IMPLANT
COVER TABLE BACK 60X90 (DRAPES) ×2 IMPLANT
CUFF TOURNIQUET SINGLE 18IN (TOURNIQUET CUFF) ×2 IMPLANT
DECANTER SPIKE VIAL GLASS SM (MISCELLANEOUS) IMPLANT
DRAPE EXTREMITY T 121X128X90 (DRAPE) ×2 IMPLANT
DRAPE SURG 17X23 STRL (DRAPES) ×2 IMPLANT
DRSG KUZMA FLUFF (GAUZE/BANDAGES/DRESSINGS) IMPLANT
DRSG PAD ABDOMINAL 8X10 ST (GAUZE/BANDAGES/DRESSINGS) IMPLANT
GAUZE SPONGE 4X4 12PLY STRL (GAUZE/BANDAGES/DRESSINGS) ×2 IMPLANT
GAUZE XEROFORM 1X8 LF (GAUZE/BANDAGES/DRESSINGS) ×2 IMPLANT
GLOVE BIOGEL PI IND STRL 8.5 (GLOVE) ×1 IMPLANT
GLOVE BIOGEL PI INDICATOR 8.5 (GLOVE) ×1
GLOVE SURG ORTHO 8.0 STRL STRW (GLOVE) ×2 IMPLANT
GOWN STRL REUS W/ TWL LRG LVL3 (GOWN DISPOSABLE) ×1 IMPLANT
GOWN STRL REUS W/TWL LRG LVL3 (GOWN DISPOSABLE) ×2
GOWN STRL REUS W/TWL XL LVL3 (GOWN DISPOSABLE) ×2 IMPLANT
NEEDLE 27GAX1X1/2 (NEEDLE) ×2 IMPLANT
NS IRRIG 1000ML POUR BTL (IV SOLUTION) ×2 IMPLANT
PACK BASIN DAY SURGERY FS (CUSTOM PROCEDURE TRAY) ×2 IMPLANT
PAD CAST 3X4 CTTN HI CHSV (CAST SUPPLIES) ×1 IMPLANT
PADDING CAST ABS 3INX4YD NS (CAST SUPPLIES)
PADDING CAST ABS 4INX4YD NS (CAST SUPPLIES) ×1
PADDING CAST ABS COTTON 3X4 (CAST SUPPLIES) IMPLANT
PADDING CAST ABS COTTON 4X4 ST (CAST SUPPLIES) ×1 IMPLANT
PADDING CAST COTTON 3X4 STRL (CAST SUPPLIES) ×2
SLEEVE SCD COMPRESS KNEE MED (MISCELLANEOUS) IMPLANT
SPLINT PLASTER CAST XFAST 3X15 (CAST SUPPLIES) IMPLANT
SPLINT PLASTER XTRA FASTSET 3X (CAST SUPPLIES)
STOCKINETTE 4X48 STRL (DRAPES) ×2 IMPLANT
SUT SILK 2 0 FS (SUTURE) IMPLANT
SUT VICRYL RAPID 5 0 P 3 (SUTURE) IMPLANT
SUT VICRYL RAPIDE 4/0 PS 2 (SUTURE) ×2 IMPLANT
SYR BULB 3OZ (MISCELLANEOUS) ×2 IMPLANT
SYR CONTROL 10ML LL (SYRINGE) ×2 IMPLANT
TOWEL OR 17X24 6PK STRL BLUE (TOWEL DISPOSABLE) ×2 IMPLANT
UNDERPAD 30X30 INCONTINENT (UNDERPADS AND DIAPERS) ×2 IMPLANT

## 2013-08-17 NOTE — Anesthesia Procedure Notes (Addendum)
Procedure Name: MAC Date/Time: 08/17/2013 2:05 PM Performed by: Demyan Fugate Pre-anesthesia Checklist: Patient identified, Emergency Drugs available, Suction available and Patient being monitored Patient Re-evaluated:Patient Re-evaluated prior to inductionOxygen Delivery Method: Simple face mask   Anesthesia Regional Block:  Bier block (IV Regional)  Pre-Anesthetic Checklist: ,, timeout performed, Correct Patient, Correct Site, Correct Laterality, Correct Procedure,, site marked, surgical consent,, at surgeon's request Needles:  Injection technique: Single-shot  Needle Type: Other      Needle Gauge: 20 and 20 G    Additional Needles: Bier block (IV Regional) Narrative:   Performed by: Personally

## 2013-08-17 NOTE — Discharge Instructions (Addendum)
[facility] Instructions Hand Surgery  Wound Care: Keep your hand elevated above the level of your heart.  Do not allow it to dangle by your side.  Keep the dressing dry and do not remove it unless your doctor advises you to do so.  He will usually change it at the time of your post-op visit.  Moving your fingers is advised to stimulate circulation but will depend on the site of your surgery.  If you have a splint applied, your doctor will advise you regarding movement.  Activity: Do not drive or operate machinery today.  Rest today and then you may return to your normal activity and work as indicated by your physician.  Diet:  Drink liquids today or eat a light diet.  You may resume a regular diet tomorrow.    General expectations: Pain for two to three days. Fingers may become slightly swollen.  Call your doctor if any of the following occur: Severe pain not relieved by pain medication. Elevated temperature. Dressing soaked with blood. Inability to move fingers. White or bluish color to fingers.   Post Anesthesia Home Care Instructions  Activity: Get plenty of rest for the remainder of the day. A responsible adult should stay with you for 24 hours following the procedure.  For the next 24 hours, DO NOT: -Drive a car -Paediatric nurse -Drink alcoholic beverages -Take any medication unless instructed by your physician -Make any legal decisions or sign important papers.  Meals: Start with liquid foods such as gelatin or soup. Progress to regular foods as tolerated. Avoid greasy, spicy, heavy foods. If nausea and/or vomiting occur, drink only clear liquids until the nausea and/or vomiting subsides. Call your physician if vomiting continues.  Special Instructions/Symptoms: Your throat may feel dry or sore from the anesthesia or the breathing tube placed in your throat during surgery. If this causes discomfort, gargle with warm salt water. The discomfort should disappear within 24  hours.

## 2013-08-17 NOTE — Anesthesia Preprocedure Evaluation (Addendum)
Anesthesia Evaluation  Patient identified by MRN, date of birth, ID band Patient awake    Reviewed: Allergy & Precautions, H&P , NPO status , Patient's Chart, lab work & pertinent test results  Airway Mallampati: II TM Distance: >3 FB Neck ROM: Full    Dental  (+) Teeth Intact, Dental Advisory Given   Pulmonary          Cardiovascular hypertension, CAD: Hx of CABG 2002, no problems since. Rhythm:Regular Rate:Normal     Neuro/Psych    GI/Hepatic   Endo/Other  diabetes, Well Controlled, Type 2, Oral Hypoglycemic Agents, Insulin DependentMorbid obesity  Renal/GU      Musculoskeletal   Abdominal   Peds  Hematology   Anesthesia Other Findings   Reproductive/Obstetrics                          Anesthesia Physical Anesthesia Plan  ASA: III  Anesthesia Plan: Bier Block and MAC   Post-op Pain Management:    Induction: Intravenous  Airway Management Planned: Simple Face Mask  Additional Equipment:   Intra-op Plan:   Post-operative Plan:   Informed Consent: I have reviewed the patients History and Physical, chart, labs and discussed the procedure including the risks, benefits and alternatives for the proposed anesthesia with the patient or authorized representative who has indicated his/her understanding and acceptance.   Dental advisory given  Plan Discussed with: CRNA, Anesthesiologist and Surgeon  Anesthesia Plan Comments:         Anesthesia Quick Evaluation

## 2013-08-17 NOTE — H&P (Signed)
[name] is a 53 year-old left-hand dominant patient who had Dupuytren's contracture operated on his left side in 2013 with fasciotomy.  He comes in with a contracture to his left small finger, not a cord to his ring, complaining of this gradually increasing with desires to have this treated as was the right side.   ALLERGIES:    Morphine.  MEDICATIONS:    Tacrolimus, mycophenolate sodium, valganciclovir, trimethoprim-sulfamethoxazole, omeprazole, labetalol, bupropion, furosemide, metolazone, glargine, glipizide, potassium chloride, potassium phosphate, magnesium, B complex/C/folic acid, mometasone furoate.   SURGICAL HISTORY:    He has had a stent bypass with harvesting of his radial artery on his left side and a kidney transplant.  FAMILY MEDICAL HISTORY:     Positive for diabetes, high blood pressure.    SOCIAL HISTORY:     He does not smoke or drink.  He is married.  Disabled.   REVIEW OF SYSTEMS:    Positive for glasses, high blood pressure, kidney disease, depression, sleep disorder, otherwise negative 14 points.  [name] is an 53 y.o. male.   Chief Complaint: Dupuytrens contracture left hand HPI: see above  Past Medical History  Diagnosis Date  . Hypertension   . Hyperlipidemia     low since renal failure  . Kidney transplant status, cadaveric 2012  . Depression   . GERD (gastroesophageal reflux disease)   . Diabetes mellitus   . Gout   . Asthma   . ESRD (end stage renal disease) 2005    Dialysis then renal transplant  . CAD (coronary artery disease) 2005    had CABG  . CHF (congestive heart failure)   . Pneumonia   . Wears glasses   . Sleep apnea     bipap with oxygen 2 l    Past Surgical History  Procedure Laterality Date  . Kidney transplant  09/13/2010    cadaver--at [facility]  . Tympanic membrane repair  1/12    left  . Dg angio av shunt*l*      right and left upper arms  . Fasciotomy  03/03/2012    Procedure: FASCIOTOMY;  Surgeon: [name],  MD;  Location: [place];  Service: Orthopedics;  Laterality: Right;  FASCIOTOMY RIGHT SMALL FINGER  . Kidney transplant    . Cardiac catheterization      [facility]  . Coronary artery bypass graft  2005    X 4  . Colonoscopy with propofol N/A 04/22/2013    Procedure: COLONOSCOPY WITH PROPOFOL;  Surgeon: [name], MD;  Location: [facility];  Service: Endoscopy;  Laterality: N/A;    Family History  Problem Relation Age of Onset  . Heart disease Father   . Diabetes Maternal Grandmother   . Breast cancer Maternal Grandmother   . Liver cancer Paternal Uncle   . Liver cancer Paternal Grandmother    Social History:  reports that he has never smoked. He has never used smokeless tobacco. He reports that he drinks alcohol. He reports that he does not use illicit drugs.  Allergies:  Allergies  Allergen Reactions  . Morphine     MORPHINE, puritis    Medications Prior to Admission  Medication Sig Dispense Refill  . albuterol (PROAIR HFA) 108 (90 BASE) MCG/ACT inhaler Inhale 2 puffs into the lungs 4 (four) times daily as needed for wheezing or shortness of breath (cough).  1 Inhaler  3  . buPROPion (WELLBUTRIN XL) 150 MG 24 hr tablet Take 150 mg by mouth every morning.       Marland Kitchen  colchicine 0.6 MG tablet Take 1 tablet (0.6 mg total) by mouth 2 (two) times daily as needed.  60 tablet  2  . furosemide (LASIX) 40 MG tablet Take 80 mg by mouth 2 (two) times daily.       Marland Kitchen glipiZIDE (GLUCOTROL) 10 MG tablet Take 1 tablet (10 mg total) by mouth 2 (two) times daily before a meal.  180 tablet  0  . insulin glargine (LANTUS) 100 UNIT/ML injection Inject 120 Units into the skin at bedtime.       Marland Kitchen lisinopril (PRINIVIL,ZESTRIL) 5 MG tablet Take 5 mg by mouth at bedtime.      . metolazone (ZAROXOLYN) 2.5 MG tablet Take 2.5 mg by mouth daily as needed (for excess fluid).       . Multiple Vitamin (MULTIVITAMIN WITH MINERALS) TABS tablet Take 1 tablet by mouth daily.      . mycophenolate  (MYFORTIC) 180 MG EC tablet Take 720 mg by mouth 2 (two) times daily.       Marland Kitchen NASONEX 50 MCG/ACT nasal spray USE 2 SPRAYS IN EACH NOSTRIL DAILY  17 g  11  . omeprazole (PRILOSEC) 20 MG capsule Take 1 capsule (20 mg total) by mouth daily.  30 capsule  5  . potassium chloride SA (K-DUR,KLOR-CON) 20 MEQ tablet Take 40 mEq by mouth 2 (two) times daily.      . tacrolimus (PROGRAF) 1 MG capsule Take 1 mg by mouth 2 (two) times daily.       Marland Kitchen aspirin 81 MG tablet Take 81 mg by mouth daily.      . beclomethasone (QVAR) 80 MCG/ACT inhaler Inhale 2 puffs into the lungs 2 (two) times daily.  1 Inhaler  3  . HYDROcodone-acetaminophen (NORCO/VICODIN) 5-325 MG per tablet Take 1 tablet by mouth every 6 (six) hours as needed for moderate pain.  30 tablet  0  . insulin aspart (NOVOLOG) 100 UNIT/ML injection Inject 1-2 Units into the skin as needed.       . labetalol (NORMODYNE) 300 MG tablet Take 300 mg by mouth 2 (two) times daily.       . magnesium oxide (MAGOX 400) 400 (241.3 MG) MG tablet Take 400 mg by mouth 2 (two) times daily.        Results for orders placed during the hospital encounter of 08/17/13 (from the past 48 hour(s))  POCT I-STAT, CHEM 8     Status: Abnormal   Collection Time    08/17/13 12:04 PM      Result Value Ref Range   Sodium 141  137 - 147 mEq/L   Potassium 3.1 (*) 3.7 - 5.3 mEq/L   Chloride 93 (*) 96 - 112 mEq/L   BUN 20  6 - 23 mg/dL   Creatinine, Ser 1.20  0.50 - 1.35 mg/dL   Glucose, Bld 144 (*) 70 - 99 mg/dL   Calcium, Ion 1.06 (*) 1.12 - 1.23 mmol/L   TCO2 38  0 - 100 mmol/L   Hemoglobin 17.7 (*) 13.0 - 17.0 g/dL   HCT 52.0  39.0 - 52.0 %    No results found.   Pertinent items are noted in HPI.  Blood pressure 155/84, pulse 70, temperature 98.1 F (36.7 C), temperature source Oral, resp. rate 20, height 5' 10.5" (1.791 m), weight 145.514 kg (320 lb 12.8 oz), SpO2 96.00%.  General appearance: alert, cooperative and appears stated age Head: Normocephalic, without  obvious abnormality Neck: no JVD Resp: clear to auscultation bilaterally Cardio: regular  rate and rhythm, S1, S2 normal, no murmur, click, rub or gallop GI: soft, non-tender; bowel sounds normal; no masses,  no organomegaly Extremities: scar from radial artery harvest left with cord ring and samml fingers Pulses: 2+ and symmetric Skin: Skin color, texture, turgor normal. No rashes or lesions Neurologic: Grossly normal Incision/Wound: na  Assessment/Plan RADIOGRAPHS:    X-rays reveal no significant degenerative changes.  DIAGNOSIS:     Dupuytren's contracture, left ring and small fingers.    RECOMMENDATIONS/PLAN:     We recommend fasciotomy to that side. He is aware of risks and complications including infection, recurrence, injury to arteries, nerves, tendons, incomplete relief of symptoms and dystrophy.   He is scheduled for fasciotomy ring and small finger, right hand as an outpatient under regional anesthesia.  [name] 08/17/2013, 1:00 PM

## 2013-08-17 NOTE — Op Note (Signed)
Dictation Number [id number]

## 2013-08-17 NOTE — Transfer of Care (Signed)
Immediate Anesthesia Transfer of Care Note  Patient: [name]  Procedure(s) Performed: Procedure(s): FASCIOTOMY LEFT RING (Left)  Patient Location: PACU  Anesthesia Type:Bier block  Level of Consciousness: awake, alert , oriented and patient cooperative  Airway & Oxygen Therapy: Patient Spontanous Breathing and Patient connected to face mask oxygen  Post-op Assessment: Report given to PACU RN and Post -op Vital signs reviewed and stable  Post vital signs: Reviewed and stable  Complications: No apparent anesthesia complications

## 2013-08-17 NOTE — Anesthesia Postprocedure Evaluation (Signed)
  Anesthesia Post-op Note  Patient: [name]  Procedure(s) Performed: Procedure(s): FASCIOTOMY LEFT RING (Left)  Patient Location: PACU  Anesthesia Type:MAC and Bier block  Level of Consciousness: awake, alert  and oriented  Airway and Oxygen Therapy: Patient Spontanous Breathing  Post-op Pain: none  Post-op Assessment: Post-op Vital signs reviewed  Post-op Vital Signs: Reviewed  Last Vitals:  Filed Vitals:   08/17/13 1515  BP: 132/62  Pulse: 87  Temp:   Resp: 23    Complications: No apparent anesthesia complications and Pt is discharged home with his wife with the knowledge he uses his BiPAP regularly and will use it in his usual fashion.

## 2013-08-17 NOTE — Brief Op Note (Signed)
08/17/2013  2:54 PM  PATIENT:  [name]  53 y.o. male  PRE-OPERATIVE DIAGNOSIS:  DUPUYTRIN'S CONTRACTURE LEFT RING SMALL  POST-OPERATIVE DIAGNOSIS:  DUPUYTRIN'S CONTRACTURE LEFT RING SMALL  PROCEDURE:  Procedure(s): FASCIOTOMY LEFT RING (Left)  SURGEON:  Surgeon(s) and Role:    * [name], MD - Primary  PHYSICIAN ASSISTANT:   ASSISTANTS: none   ANESTHESIA:   local and regional  EBL:  Total I/O In: 1000 [I.V.:1000] Out: -   BLOOD ADMINISTERED:none  DRAINS: none   LOCAL MEDICATIONS USED:  BUPIVICAINE   SPECIMEN:  No Specimen  DISPOSITION OF SPECIMEN:  N/A  COUNTS:  YES  TOURNIQUET:   Total Tourniquet Time Documented: Forearm (Left) - 44 minutes Total: Forearm (Left) - 44 minutes   DICTATION: .Other Dictation: Dictation Number [id number]  PLAN OF CARE: Discharge to home after PACU  PATIENT DISPOSITION:  PACU - hemodynamically stable.

## 2013-08-19 ENCOUNTER — Encounter (HOSPITAL_BASED_OUTPATIENT_CLINIC_OR_DEPARTMENT_OTHER): Payer: Self-pay | Admitting: Orthopedic Surgery

## 2013-08-19 ENCOUNTER — Telehealth: Payer: Self-pay

## 2013-08-19 NOTE — Telephone Encounter (Signed)
Crystal with Dr. Duke Salvia office needs clearance for bariatric surgery

## 2013-08-19 NOTE — Brief Op Note (Signed)
08/17/2013  2:20 PM  PATIENT:  [name]  53 y.o. male  PRE-OPERATIVE DIAGNOSIS:  DUPUYTRIN'S CONTRACTURE LEFT RING SMALL  POST-OPERATIVE DIAGNOSIS:  DUPUYTRIN'S CONTRACTURE LEFT RING SMALL  PROCEDURE:  Procedure(s): FASCIOTOMY LEFT RING (Left)  SURGEON:  Surgeon(s) and Role:    * [name], MD - Primary  PHYSICIAN ASSISTANT:   ASSISTANTS: none   ANESTHESIA:   local and regional  EBL:  Total I/O In: 1440 [P.O.:240; I.V.:1200] Out: -   BLOOD ADMINISTERED:none  DRAINS: none   LOCAL MEDICATIONS USED:  BUPIVICAINE   SPECIMEN:  No Specimen  DISPOSITION OF SPECIMEN:  N/A  COUNTS:  YES  TOURNIQUET:   Total Tourniquet Time Documented: Forearm (Left) - 44 minutes Total: Forearm (Left) - 44 minutes   DICTATION: .Other Dictation: Dictation Number [id number]  PLAN OF CARE: Discharge to home after PACU  PATIENT DISPOSITION:  PACU - hemodynamically stable.

## 2013-08-21 NOTE — Telephone Encounter (Signed)
This was done previously. See me last office note and echo.

## 2013-08-23 ENCOUNTER — Encounter: Payer: Self-pay | Admitting: Pulmonary Disease

## 2013-08-23 ENCOUNTER — Encounter: Payer: Self-pay | Admitting: *Deleted

## 2013-08-23 NOTE — Telephone Encounter (Signed)
Faxed cardiac clearance for bariatric surgery.

## 2013-08-26 NOTE — Op Note (Signed)
Dictated [id number]

## 2013-08-27 NOTE — Op Note (Signed)
[name], [name]                ACCOUNT NO.:  [id number]  MEDICAL RECORD NO.:  [id number]  LOCATION:                                 FACILITY:  PHYSICIAN:  Daryll Brod, M.D.       DATE OF BIRTH:  January 25, 1961  DATE OF PROCEDURE:  08/26/2013 DATE OF DISCHARGE:                              OPERATIVE REPORT   PREOPERATIVE DIAGNOSIS:  Dupuytren contracture, ring and small fingers right hand.  POSTOPERATIVE DIAGNOSIS:  Dupuytren contracture, ring and small fingers right hand.  OPERATION:  Fasciotomy ring and small fingers right hand.  SURGEON:  Daryll Brod, MD  ANESTHESIA:  Forearm block.  HISTORY:  The patient is a 53 year old male with a history of Dupuytren contracture, right hand.  He has undergone fasciotomy in the past.  He is admitted now for fasciotomy of the ring and small fingers right hand. Pre, peri and postoperative course have been discussed along with risks and complications.  She is aware that there is no guarantee with the surgery, possibility of infection, recurrence of injury to arteries, nerves, tendons, incomplete relief of symptoms, and dystrophy.  In preoperative area, the patient is seen, the extremity marked by both patient and surgeon.  Antibiotic given.  PROCEDURE IN DETAIL:  The patient was brought to the operating room, where a right arm block was carried out.  After adequate anesthesia was afforded in the supine position, he was prepped and draped using ChloraPrep.  A time-out taken confirming the patient and procedure.  An incision was made at the junction of the cord and the transverse fascia of the flexor tendons, carried down through subcutaneous tissue. Neurovascular structures were identified.  These were transected with sharp dissection taking care to protect down neurovascular structures beneath, this was to the ring and small fingers.  A second incision was made distally on the small finger.  Again, the cord, neurovascular bundles were  identified.  The cord was isolated.  This was transected with both sharp and blunt dissection.  A separate incision was then made on the ulnar aspect of the right small finger for an abductor digiti quinti cord.  This was isolated.  Neurovascular structures identified. These were protected and a segment of the cord was excised.  The wounds were copiously irrigated with saline.  They were then closed with interrupted 4-0 Vicryl Rapide sutures leaving small gaps open for drainage.  The area was then infiltrated with 0.25% Marcaine without epinephrine.  A sterile compressive dressing was applied.  On deflation of the tourniquet, all fingers immediately pinked.  He was taken to the recovery room for observation in satisfactory condition.          ______________________________ Daryll Brod, M.D.     GK/MEDQ  D:  08/26/2013  T:  08/27/2013  Job:  [id number]

## 2013-09-09 LAB — LIPID PANEL
Cholesterol: 178 mg/dL (ref 0–200)
HDL: 25 mg/dL — AB (ref 35–70)
LDL Cholesterol: 88 mg/dL
Triglycerides: 423 mg/dL — AB (ref 40–160)

## 2013-09-09 LAB — HEMOGLOBIN A1C: HEMOGLOBIN A1C: 8.3 % — AB (ref 4.0–6.0)

## 2013-09-16 ENCOUNTER — Telehealth: Payer: Self-pay | Admitting: Pulmonary Disease

## 2013-09-16 NOTE — Telephone Encounter (Signed)
Can double book with me, or schedule with Tammy Parrett or another provider.

## 2013-09-16 NOTE — Telephone Encounter (Signed)
Called spoke with pt. He reports he needs OV with VS prior to 10/06/13 for face to face regarding his O2. Nothing available. Please advise Dr. Halford Chessman if pt can be worked in? thanks

## 2013-09-17 NOTE — Telephone Encounter (Signed)
Spoke with the pt and scheduled appt with TP for 10/01/13

## 2013-09-21 ENCOUNTER — Other Ambulatory Visit (HOSPITAL_COMMUNITY): Payer: Self-pay | Admitting: *Deleted

## 2013-09-22 ENCOUNTER — Encounter: Payer: Self-pay | Admitting: Pulmonary Disease

## 2013-09-27 ENCOUNTER — Other Ambulatory Visit: Payer: Self-pay | Admitting: Internal Medicine

## 2013-09-27 NOTE — Telephone Encounter (Signed)
rx sent to pharmacy by e-script Left message on machine that pt needs to be seen

## 2013-09-27 NOTE — Telephone Encounter (Signed)
Okay #5 x 3 Needs follow up with me some time in the next few months

## 2013-10-01 ENCOUNTER — Telehealth: Payer: Self-pay | Admitting: *Deleted

## 2013-10-01 ENCOUNTER — Ambulatory Visit (INDEPENDENT_AMBULATORY_CARE_PROVIDER_SITE_OTHER): Payer: 59 | Admitting: Adult Health

## 2013-10-01 ENCOUNTER — Encounter: Payer: Self-pay | Admitting: Adult Health

## 2013-10-01 VITALS — BP 140/70 | HR 72 | Ht 71.0 in | Wt 328.2 lb

## 2013-10-01 DIAGNOSIS — I2510 Atherosclerotic heart disease of native coronary artery without angina pectoris: Secondary | ICD-10-CM

## 2013-10-01 DIAGNOSIS — J45998 Other asthma: Secondary | ICD-10-CM

## 2013-10-01 DIAGNOSIS — E662 Morbid (severe) obesity with alveolar hypoventilation: Secondary | ICD-10-CM

## 2013-10-01 DIAGNOSIS — J45909 Unspecified asthma, uncomplicated: Secondary | ICD-10-CM | POA: Diagnosis not present

## 2013-10-01 MED ORDER — BECLOMETHASONE DIPROPIONATE 80 MCG/ACT IN AERS: 2.0000 | INHALATION_SPRAY | Freq: Two times a day (BID) | RESPIRATORY_TRACT | Status: DC

## 2013-10-01 NOTE — Patient Instructions (Addendum)
Continue on QVAR 2 puffs Twice daily  , rinse after use.  Work on Weight loss.  Wear BIPAP at  bedtime   And naps  Continue with Oxygen at 2l/m  Follow up Dr. Halford Chessman  In  4 weeks and As needed

## 2013-10-01 NOTE — Progress Notes (Signed)
  Subjective:    Patient ID: [name], male    DOB: 05-08-1960, 53 y.o.   MRN: [id number]  HPI Initial consult 10/27/12  evaluation of sleep problems and dyspnea. Hx of renal transplant.    10/01/2013 Follow up Patient returns for followup of sleep apnea, and asthma. Pt is now on BIPAP,says he feels so much better now. Wears on avg 6 hr a night, every night.  Feels very rested.  Is undergoing workup for gastric bypass surgery . Will need preop clearance from pulmonary for surgery in future .  Download shows good 100% compliance w/ avg use ~4.42 min. AHI 1.4 .  Asthma under good control without flare of wheezing or cough.  Rare SABA use.     Tests: PSG 03/15/12 >> AHI 57.9, low SpO2 78%.  CPAP 12 cm H2O CT chest [facility]) 09/24/12 >> central bronchial thickening, LUL pleural scarring V/Q scan [facility]) 09/24/12 >> matched defect Lt upper lobe ABG [facility]) 09/25/12 >> pH 7.40, PaCO2 68.2, PaO2 61.5  Echo bubble study [facility]) 09/26/12 >> poor study, no identified shunt Room air SpO2 10/27/12 >> 87%, start 2 liters oxygen 24/7 ONO with CPAP and 2 liters 11/27/12 >> Test time 4 hrs 39 min. Basal SpO2 88%, low SpO2 67%. Spent 117 min with SpO2 < 88% Echo 04/27/13 >> EF 35 to 40%, mod LVH, grade 2 diastolic dysfunction, severe LA dilation, mod RA dilation ABG on room air 05/12/13 >> pH 7.41, PaCO2 70, PaO2 < 43   Review of Systems  Constitutional:   No  weight loss, night sweats,  Fevers, chills,  +fatigue, or  lassitude.  HEENT:   No headaches,  Difficulty swallowing,  Tooth/dental problems, or  Sore throat,                No sneezing, itching, ear ache,  +nasal congestion, post nasal drip,   CV:  No chest pain,  Orthopnea, PND, s  anasarca, dizziness, palpitations, syncope.   GI  No heartburn, indigestion, abdominal pain, nausea, vomiting, diarrhea, change in bowel habits, loss of appetite, bloody stools.   Resp:    No chest wall deformity  Skin: no rash or lesions.  GU: no  dysuria, change in color of urine, no urgency or frequency.  No flank pain, no hematuria   MS:  No joint pain or swelling.  No decreased range of motion.  No back pain.  Psych:  No change in mood or affect. No depression or anxiety.  No memory loss.         Objective:   Physical Exam  GEN: A/Ox3; pleasant , NAD, obese   HEENT:  [place]/AT,  EACs-clear, TMs-wnl, NOSE-clear, THROAT-clear, no lesions, no postnasal drip or exudate noted.   NECK:  Supple w/ fair ROM; no JVD; normal carotid impulses w/o bruits; no thyromegaly or nodules palpated; no lymphadenopathy.  RESP  Clear  P & A; w/o, wheezes/ rales/ or rhonchi.no accessory muscle use, no dullness to percussion  CARD:  RRR, no m/r/g  , tr-+ peripheral edema, pulses intact, no cyanosis or clubbing.  GI:   Soft & nt; nml bowel sounds; no organomegaly or masses detected.  Musco: Warm bil, no deformities or joint swelling noted.   Neuro: alert, no focal deficits noted.    Skin: Warm, no lesions or rashes         Assessment & Plan:

## 2013-10-01 NOTE — Telephone Encounter (Signed)
**  DIABETIC BUNDLE**  Left voicemail letting pt know he is due for a f/u appt with Dr. Silvio Pate with a fasting lab appt prior to check lipid profile and A1c

## 2013-10-01 NOTE — Assessment & Plan Note (Signed)
OHS/OSA  Good BIPAP compliance   Plan  Work on Weight loss.  Wear BIPAP at  bedtime   And naps  Continue with Oxygen at 2l/m  Follow up Dr. Halford Chessman  In  4 weeks and As needed

## 2013-10-01 NOTE — Assessment & Plan Note (Signed)
Controlled on rx   Plan  Continue on QVAR 2 puffs Twice daily  , rinse after use.  Continue with Oxygen at 2l/m  Follow up Dr. Halford Chessman  In  4 weeks and As needed

## 2013-10-04 ENCOUNTER — Other Ambulatory Visit: Payer: Self-pay | Admitting: Internal Medicine

## 2013-10-04 ENCOUNTER — Ambulatory Visit (INDEPENDENT_AMBULATORY_CARE_PROVIDER_SITE_OTHER): Payer: 59 | Admitting: Internal Medicine

## 2013-10-04 ENCOUNTER — Encounter: Payer: Self-pay | Admitting: Internal Medicine

## 2013-10-04 VITALS — BP 130/70 | HR 57 | Temp 97.7°F | Wt 325.0 lb

## 2013-10-04 DIAGNOSIS — F3289 Other specified depressive episodes: Secondary | ICD-10-CM

## 2013-10-04 DIAGNOSIS — J45998 Other asthma: Secondary | ICD-10-CM

## 2013-10-04 DIAGNOSIS — I2510 Atherosclerotic heart disease of native coronary artery without angina pectoris: Secondary | ICD-10-CM

## 2013-10-04 DIAGNOSIS — F3340 Major depressive disorder, recurrent, in remission, unspecified: Secondary | ICD-10-CM | POA: Insufficient documentation

## 2013-10-04 DIAGNOSIS — G3184 Mild cognitive impairment, so stated: Secondary | ICD-10-CM

## 2013-10-04 DIAGNOSIS — Z940 Kidney transplant status: Secondary | ICD-10-CM

## 2013-10-04 DIAGNOSIS — F32A Depression, unspecified: Secondary | ICD-10-CM

## 2013-10-04 DIAGNOSIS — E1129 Type 2 diabetes mellitus with other diabetic kidney complication: Secondary | ICD-10-CM

## 2013-10-04 DIAGNOSIS — E1165 Type 2 diabetes mellitus with hyperglycemia: Principal | ICD-10-CM

## 2013-10-04 DIAGNOSIS — F329 Major depressive disorder, single episode, unspecified: Secondary | ICD-10-CM | POA: Insufficient documentation

## 2013-10-04 DIAGNOSIS — J45909 Unspecified asthma, uncomplicated: Secondary | ICD-10-CM

## 2013-10-04 DIAGNOSIS — R413 Other amnesia: Secondary | ICD-10-CM

## 2013-10-04 HISTORY — DX: Major depressive disorder, recurrent, in remission, unspecified: F33.40

## 2013-10-04 HISTORY — DX: Major depressive disorder, single episode, unspecified: F32.9

## 2013-10-04 LAB — HM DIABETES FOOT EXAM

## 2013-10-04 NOTE — Assessment & Plan Note (Signed)
Originally as grief reaction Now in remission Will continue the med

## 2013-10-04 NOTE — Progress Notes (Signed)
Subjective:    Patient ID: [name], male    DOB: [birth year], 53 y.o.   MRN: [id number]  HPI Doing fairly well Still sees Dr Gabriel Carina for diabetes--- last A1c 8.3% Working with Dr Gabriel Carina for this  Renal transplant is working well  Has been fighting the gout lately Thinks he may be eating too much protein No preventative Uses the colchicine prn--- usually better in 1-2 days  Asthma tends to act up in the hot weather Does use qvar daily Has used the albuterol ~once a week Discussed using albuterol before exercise  Mood is up and down No recurrence of bad depression Satisfied with ongoing Rx  Current Outpatient Prescriptions on File Prior to Visit  Medication Sig Dispense Refill  . albuterol (PROAIR HFA) 108 (90 BASE) MCG/ACT inhaler Inhale 2 puffs into the lungs 4 (four) times daily as needed for wheezing or shortness of breath (cough).  1 Inhaler  3  . aspirin 81 MG tablet Take 81 mg by mouth daily.      . beclomethasone (QVAR) 80 MCG/ACT inhaler Inhale 2 puffs into the lungs 2 (two) times daily.  3 Inhaler  3  . buPROPion (WELLBUTRIN XL) 150 MG 24 hr tablet Take 150 mg by mouth every morning.       Marland Kitchen CIALIS 20 MG tablet TAKE 1/2 TO 1 TABLET EVERY OTHER DAY AS NEEDED for erectile dysfunction.  5 tablet  3  . colchicine 0.6 MG tablet Take 1 tablet (0.6 mg total) by mouth 2 (two) times daily as needed.  60 tablet  2  . furosemide (LASIX) 40 MG tablet Take 80 mg by mouth 2 (two) times daily.       Marland Kitchen glipiZIDE (GLUCOTROL) 10 MG tablet Take 1 tablet (10 mg total) by mouth 2 (two) times daily before a meal.  180 tablet  0  . HYDROcodone-acetaminophen (NORCO) 5-325 MG per tablet Take 1 tablet by mouth every 6 (six) hours as needed for moderate pain.  30 tablet  0  . insulin aspart (NOVOLOG) 100 UNIT/ML injection Inject 1-2 Units into the skin as needed.       . insulin glargine (LANTUS) 100 UNIT/ML injection Inject 120 Units into the skin at bedtime.       Marland Kitchen labetalol (NORMODYNE)  300 MG tablet Take 300 mg by mouth 2 (two) times daily.       Marland Kitchen lisinopril (PRINIVIL,ZESTRIL) 5 MG tablet Take 5 mg by mouth at bedtime.      . magnesium oxide (MAGOX 400) 400 (241.3 MG) MG tablet Take 400 mg by mouth 2 (two) times daily.      . metolazone (ZAROXOLYN) 2.5 MG tablet Take 2.5 mg by mouth daily as needed (for excess fluid).       . Multiple Vitamin (MULTIVITAMIN WITH MINERALS) TABS tablet Take 1 tablet by mouth daily.      . mycophenolate (MYFORTIC) 180 MG EC tablet Take 720 mg by mouth 2 (two) times daily.       Marland Kitchen NASONEX 50 MCG/ACT nasal spray USE 2 SPRAYS IN EACH NOSTRIL DAILY  17 g  11  . potassium chloride SA (K-DUR,KLOR-CON) 20 MEQ tablet Take 60 mEq by mouth 2 (two) times daily.       . tacrolimus (PROGRAF) 1 MG capsule Take 1 mg by mouth 2 (two) times daily.        No current facility-administered medications on file prior to visit.    Allergies  Allergen Reactions  .  Morphine     MORPHINE, puritis    Past Medical History  Diagnosis Date  . Hypertension   . Hyperlipidemia     low since renal failure  . Kidney transplant status, cadaveric 2012  . Depression   . GERD (gastroesophageal reflux disease)   . Diabetes mellitus   . Gout   . Asthma   . ESRD (end stage renal disease) 2005    Dialysis then renal transplant  . CAD (coronary artery disease) 2005    had CABG  . CHF (congestive heart failure)   . Pneumonia   . Wears glasses   . Sleep apnea     bipap with oxygen 2 l    Past Surgical History  Procedure Laterality Date  . Kidney transplant  09/13/2010    cadaver--[facility]  . Tympanic membrane repair  1/12    left  . Dg angio av shunt*l*      right and left upper arms  . Fasciotomy  03/03/2012    Procedure: FASCIOTOMY;  Surgeon: [name], MD;  Location: [place];  Service: Orthopedics;  Laterality: Right;  FASCIOTOMY RIGHT SMALL FINGER  . Kidney transplant    . Cardiac catheterization      [facility]  . Coronary artery bypass  graft  2005    X 4  . Colonoscopy with propofol N/A 04/22/2013    Procedure: COLONOSCOPY WITH PROPOFOL;  Surgeon: [name], MD;  Location: [facility];  Service: Endoscopy;  Laterality: N/A;  . Fasciotomy Left 08/17/2013    Procedure: FASCIOTOMY LEFT RING;  Surgeon: [name], MD;  Location: [place];  Service: Orthopedics;  Laterality: Left;    Family History  Problem Relation Age of Onset  . Heart disease Father   . Diabetes Maternal Grandmother   . Breast cancer Maternal Grandmother   . Liver cancer Paternal Uncle   . Liver cancer Paternal Grandmother     History   Social History  . Marital Status: Married    Spouse Name: N/A    Number of Children: 2  . Years of Education: N/A   Occupational History  . 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 Smoking status: Never Smoker   . Smokeless tobacco: Never Used  . Alcohol Use: Yes     Comment: occasional  . Drug Use: No  . Sexual Activity: Not on file   Other Topics Concern  . Not on file   Social History Narrative   Has living will   Wife is health care POA   Would accept resuscitation attempts   Hasn't considered tube feedings   Review of Systems Weight is up about 6# of fluid--relates to diet (recent hot dogs) Sleeps okay --now on BiPap. This has really helped his energy levels Bowels fine Voids okay    Objective:   Physical Exam  Constitutional: He appears well-developed and well-nourished. No distress.  Neck: Normal range of motion. Neck supple. No thyromegaly present.  Cardiovascular: Normal rate, regular rhythm, normal heart sounds and intact distal pulses.  Exam reveals no gallop.   No murmur heard. Pulmonary/Chest: Effort normal and breath sounds normal. No respiratory distress. He has no wheezes. He has no rales.  Musculoskeletal: He exhibits no edema.  Lymphadenopathy:    He has no cervical adenopathy.  Skin:  No foot  lesions  Psychiatric: He has a normal mood and affect. His behavior is  normal.          Assessment & Plan:

## 2013-10-04 NOTE — Assessment & Plan Note (Signed)
Rarely needs albuterol Doing okay

## 2013-10-04 NOTE — Assessment & Plan Note (Signed)
Continues to follow at [facility] He reports good status

## 2013-10-04 NOTE — Assessment & Plan Note (Signed)
No progression but still bothers him Uses his smart phone for reminders and as an adjunct to his faulty memory

## 2013-10-04 NOTE — Assessment & Plan Note (Signed)
A1c back up a little this past time Working with Dr Gabriel Carina on this

## 2013-10-04 NOTE — Assessment & Plan Note (Signed)
Doing okay now with prn colchicine

## 2013-10-27 DIAGNOSIS — E1139 Type 2 diabetes mellitus with other diabetic ophthalmic complication: Secondary | ICD-10-CM | POA: Insufficient documentation

## 2013-11-19 ENCOUNTER — Telehealth: Payer: Self-pay | Admitting: Pulmonary Disease

## 2013-11-19 ENCOUNTER — Ambulatory Visit (INDEPENDENT_AMBULATORY_CARE_PROVIDER_SITE_OTHER): Payer: Medicare Other | Admitting: Pulmonary Disease

## 2013-11-19 ENCOUNTER — Encounter: Payer: Self-pay | Admitting: Pulmonary Disease

## 2013-11-19 VITALS — BP 172/100 | HR 67 | Ht 71.0 in | Wt 326.4 lb

## 2013-11-19 DIAGNOSIS — I2510 Atherosclerotic heart disease of native coronary artery without angina pectoris: Secondary | ICD-10-CM

## 2013-11-19 DIAGNOSIS — E662 Morbid (severe) obesity with alveolar hypoventilation: Secondary | ICD-10-CM

## 2013-11-19 DIAGNOSIS — Z01811 Encounter for preprocedural respiratory examination: Secondary | ICD-10-CM

## 2013-11-19 DIAGNOSIS — J45909 Unspecified asthma, uncomplicated: Secondary | ICD-10-CM

## 2013-11-19 DIAGNOSIS — J45998 Other asthma: Secondary | ICD-10-CM

## 2013-11-19 DIAGNOSIS — G4733 Obstructive sleep apnea (adult) (pediatric): Secondary | ICD-10-CM

## 2013-11-19 NOTE — Progress Notes (Signed)
Chief Complaint  Patient presents with  . Follow-up    Pt using QVAR, BiPAP and O2 as directed. Pt states that he sees much improvement since last OV-- some bad days.     PRIMARY NEPHROLOGIST: Shea Stakes, M.D., Dr Pati Gallo at [facility]. ENDOCRINOLOGIST: Betsey Holiday. [place], M.D. in [place], [place].  History of Present Illness: [name] is a 53 y.o. male with OSA/OHS.  He has hx of renal transplant, asthma and combined heart failure.  He is surprised at home much better he feels since being on BiPAP and oxygen.  He is sleeping better, breathing better, and has more energy.  He is not having difficulty recently with cough, wheeze, chest pain, fever, or sputum.  He is being assessed for gastric bypass surgery with Dr. Verlin Fester of  [facility] in [place], [phone].   Tests: PSG 03/15/12 >> AHI 57.9, low SpO2 78%.  CPAP 12 cm H2O CT chest [facility]) 09/24/12 >> central bronchial thickening, LUL pleural scarring V/Q scan [facility]) 09/24/12 >> matched defect Lt upper lobe ABG [facility]) 09/25/12 >> pH 7.40, PaCO2 68.2, PaO2 61.5  Echo bubble study [facility]) 09/26/12 >> poor study, no identified shunt Room air SpO2 10/27/12 >> 87%, start 2 liters oxygen 24/7 ONO with CPAP and RA 11/02/12 >> Test time 1 hr 22 min. Mean SpO2 76%, low SpO2 59%. Spent 1 hr 16 min with SpO2 < 88%. CPAP 10/15/12 to 11/13/12 >> used on 30 of 30 nights with average 4 hrs 22 min. Average AHI 3.8 with CPAP 12 cm H2O. PFT 11/27/12 >> FEV1 1.74, FEV1% 80%, TLC 4.16 (60%), DLCO 81%, +BD CPAP 11/16/12 to 12/15/12 >> Used on 30 of 30 nights with average 3 hrs 56 min. Average AHI 3.7 with CPAP 12 cm H2O. ONO with CPAP and 2 liters 11/27/12 >> Test time 4 hrs 39 min. Basal SpO2 88%, low SpO2 67%. Spent 117 min with SpO2 < 88% Echo 04/27/13 >> EF 35 to 40%, mod LVH, grade 2 diastolic dysfunction, severe LA dilation, mod RA dilation ABG on room air 05/12/13 >> pH 7.41, PaCO2 70, PaO2 < 43 BiPAP 06/21/13 >> 14/9  cm H2O with 2 liters oxygen >> AHI 0.   He  has a past medical history of Hypertension; Hyperlipidemia; Kidney transplant status, cadaveric (2012); Depression; GERD (gastroesophageal reflux disease); Diabetes mellitus; Gout; Asthma; ESRD (end stage renal disease) (2005); CAD (coronary artery disease) (2005); CHF (congestive heart failure); Pneumonia; Wears glasses; and Sleep apnea.   He  has past surgical history that includes Kidney transplant (09/13/2010); Tympanic membrane repair (1/12); dg angio av shunt*l*; Fasciotomy (03/03/2012); Kidney transplant; Cardiac catheterization; Coronary artery bypass graft (2005); Colonoscopy with propofol (N/A, 04/22/2013); and Fasciotomy (Left, 08/17/2013).   His family history includes Breast cancer in his maternal grandmother; Diabetes in his maternal grandmother; Heart disease in his father; Liver cancer in his paternal grandmother and paternal uncle.   His  reports that he has never smoked. He has never used smokeless tobacco. He reports that he drinks alcohol. He reports that he does not use illicit drugs.   Allergies  Allergen Reactions  . Morphine     MORPHINE, puritis      Medication List       This list is accurate as of: 11/19/13 11:59 PM.  Always use your most recent med list.               albuterol 108 (90 BASE) MCG/ACT inhaler  Commonly known as:  PROAIR HFA  Inhale 2 puffs into the lungs 4 (four) times daily as needed for wheezing or shortness of breath (cough).     aspirin 81 MG tablet  Take 81 mg by mouth daily.     beclomethasone 80 MCG/ACT inhaler  Commonly known as:  QVAR  Inhale 2 puffs into the lungs 2 (two) times daily.     buPROPion 150 MG 24 hr tablet  Commonly known as:  WELLBUTRIN XL  Take 150 mg by mouth every morning.     CIALIS 20 MG tablet  Generic drug:  tadalafil  TAKE 1/2 TO 1 TABLET EVERY OTHER DAY AS NEEDED for erectile dysfunction.     colchicine 0.6 MG tablet  Take 1 tablet (0.6 mg total) by  mouth 2 (two) times daily as needed.     furosemide 40 MG tablet  Commonly known as:  LASIX  Take 80 mg by mouth 2 (two) times daily.     glipiZIDE 10 MG tablet  Commonly known as:  GLUCOTROL  Take 1 tablet (10 mg total) by mouth 2 (two) times daily before a meal.     HYDROcodone-acetaminophen 5-325 MG per tablet  Commonly known as:  NORCO  Take 1 tablet by mouth every 6 (six) hours as needed for moderate pain.     insulin aspart 100 UNIT/ML injection  Commonly known as:  novoLOG  Inject 1-2 Units into the skin as needed.     insulin glargine 100 UNIT/ML injection  Commonly known as:  LANTUS  Inject 120 Units into the skin at bedtime.     labetalol 300 MG tablet  Commonly known as:  NORMODYNE  Take 300 mg by mouth 2 (two) times daily.     lisinopril 5 MG tablet  Commonly known as:  PRINIVIL,ZESTRIL  Take 5 mg by mouth at bedtime.     metolazone 2.5 MG tablet  Commonly known as:  ZAROXOLYN  Take 2.5 mg by mouth daily as needed (for excess fluid).     multivitamin with minerals Tabs tablet  Take 1 tablet by mouth daily.     mycophenolate 180 MG EC tablet  Commonly known as:  MYFORTIC  Take 720 mg by mouth 2 (two) times daily.     NASONEX 50 MCG/ACT nasal spray  Generic drug:  mometasone  USE 2 SPRAYS IN EACH NOSTRIL DAILY     omeprazole 20 MG capsule  Commonly known as:  PRILOSEC  TAKE 1 CAPSULE BY MOUTH DAILY.     potassium chloride SA 20 MEQ tablet  Commonly known as:  K-DUR,KLOR-CON  Take 60 mEq by mouth 2 (two) times daily.     tacrolimus 1 MG capsule  Commonly known as:  PROGRAF  Take 1 mg by mouth 2 (two) times daily.         Physical Exam:  General - No distress ENT - No sinus tenderness, no oral exudate, MP 4, enlarged tongue Cardiac - s1s2 regular, no murmur Chest - Decreased breath sounds, no wheeze/rales Back - No focal tenderness Abd - Soft, non-tender Ext - No edema, old AV grafts in Rt upper arm and Lt forearm Neuro - Normal  strength Skin - No rashes Psych - Normal mood, and behavior  Assessment:  Chesley [name], MD [place] 11/19/2013, 3:58 PM Pager:  [phone] After 3pm call: [phone]

## 2013-11-19 NOTE — Patient Instructions (Signed)
Follow up in 4 months 

## 2013-11-19 NOTE — Telephone Encounter (Signed)
ATC patient-unable to reach him-NA and unable to leave a message will need to try again Monday AM.

## 2013-11-23 DIAGNOSIS — Z01811 Encounter for preprocedural respiratory examination: Secondary | ICD-10-CM | POA: Insufficient documentation

## 2013-11-23 LAB — HEMOGLOBIN A1C: HEMOGLOBIN A1C: 7.8 % — AB (ref 4.0–6.0)

## 2013-11-23 NOTE — Assessment & Plan Note (Signed)
Continue supplemental oxygen with BiPAP.

## 2013-11-23 NOTE — Assessment & Plan Note (Signed)
He is being evaluated for bariatric surgery.  Clearly, much of his respiratory conditions are related to his weight.  I think this would be a good option for him.  I have explained that his pulmonary regimen is optimized at this point, and he can proceed with surgery.    I did explain that he is at increased, but not prohibitive, risk of peri-operative pulmonary complications.  Main issues will be related to respiratory failure and prolonged need for ventilatory support and risk of pulmonary infections.  He should continue on his inhaler regimen during the peri-operative period.  He should also continue BiPAP with supplemental oxygen after surgery, and have close monitoring of his oxygenation in the peri-operative period.

## 2013-11-23 NOTE — Assessment & Plan Note (Signed)
He is compliant with therapy and reports benefit.  He is to continue BiPAP 14/9 cm H2O with 2 liters oxygen.

## 2013-11-23 NOTE — Assessment & Plan Note (Signed)
Stable.  He is to continue Qvar with prn proair.  Will re-assess his inhaler needs at next visit, and determine if he can step down his regimen.

## 2013-11-24 NOTE — Telephone Encounter (Signed)
ATC patient-unable to leave message as no voicemail set up. I called spouse's number and left message for her to have patient contact our office(spouse is listed on DPR to speak with)

## 2013-11-25 ENCOUNTER — Encounter: Payer: Self-pay | Admitting: *Deleted

## 2013-11-25 NOTE — Telephone Encounter (Signed)
VS, please advise if okay to switch to Dr Lake Bells since he lives in [place]  Thanks!

## 2013-11-25 NOTE — Telephone Encounter (Signed)
Fine by me  

## 2013-11-25 NOTE — Telephone Encounter (Signed)
Dr Lake Bells please advise if ok to accept pt switch from VS because he lives in [place].

## 2013-11-25 NOTE — Telephone Encounter (Signed)
[name] retunred [phone]

## 2013-11-25 NOTE — Telephone Encounter (Signed)
I am okay with switch, but most of his issues are related to sleep medicine >> so needs to be cleared with Dr. Lake Bells.

## 2013-11-26 NOTE — Telephone Encounter (Signed)
atc  Pt VM not set up.

## 2013-11-26 NOTE — Telephone Encounter (Signed)
ATC pt vm NOT SET UP YET. wcb

## 2013-12-01 NOTE — Telephone Encounter (Signed)
ATC  Pt VM not set up wcb

## 2013-12-02 NOTE — Telephone Encounter (Signed)
Called pt. No vm set up yet.

## 2013-12-03 NOTE — Telephone Encounter (Signed)
appt set for 01-03-14, pt aware. Tamar[place] Bing, CMA

## 2013-12-13 ENCOUNTER — Ambulatory Visit (INDEPENDENT_AMBULATORY_CARE_PROVIDER_SITE_OTHER): Payer: 59 | Admitting: Cardiovascular Disease

## 2013-12-13 ENCOUNTER — Encounter: Payer: Self-pay | Admitting: Cardiovascular Disease

## 2013-12-13 VITALS — BP 184/105 | HR 62 | Ht 71.0 in | Wt 319.5 lb

## 2013-12-13 DIAGNOSIS — I5022 Chronic systolic (congestive) heart failure: Secondary | ICD-10-CM

## 2013-12-13 DIAGNOSIS — R42 Dizziness and giddiness: Secondary | ICD-10-CM

## 2013-12-13 DIAGNOSIS — I10 Essential (primary) hypertension: Secondary | ICD-10-CM

## 2013-12-13 DIAGNOSIS — I2510 Atherosclerotic heart disease of native coronary artery without angina pectoris: Secondary | ICD-10-CM

## 2013-12-13 DIAGNOSIS — E785 Hyperlipidemia, unspecified: Secondary | ICD-10-CM

## 2013-12-13 MED ORDER — LISINOPRIL 10 MG PO TABS: 10.0000 mg | ORAL_TABLET | Freq: Every day | ORAL | Status: DC

## 2013-12-13 MED ORDER — CARVEDILOL 12.5 MG PO TABS: 12.5000 mg | ORAL_TABLET | Freq: Two times a day (BID) | ORAL | Status: DC

## 2013-12-13 NOTE — Assessment & Plan Note (Signed)
Blood pressure is elevated. Changes were made as outlined above.

## 2013-12-13 NOTE — Assessment & Plan Note (Signed)
Lab Results  Component Value Date   CHOL 178 09/09/2013   HDL 25* 09/09/2013   LDLCALC 88 09/09/2013   LDLDIRECT 41.9 04/20/2012   TRIG 423* 09/09/2013   CHOLHDL 7 04/20/2012   We should consider treatment with a small dose statin even though his LDL was not elevated given his known history of coronary artery disease and diabetes.

## 2013-12-13 NOTE — Assessment & Plan Note (Signed)
He is currently New [name] Heart Association class II. He appears to be euvolemic. His blood pressure has been uncontrolled and we need to optimal he controlled his hypertension. I switched to labetalol to carvedilol and increase the dose of lisinopril. He is status post kidney transplant being followed at [facility]. Most recent creatinine was 1.2.

## 2013-12-13 NOTE — Patient Instructions (Signed)
Stop Labetalol   Start Carvedilol 12.5 mg twice daily.  Increase Lisinopril to 10 mg once daily.   Follow up in 1 month.

## 2013-12-13 NOTE — Assessment & Plan Note (Signed)
He reports no symptoms suggestive of angina. Continue medical therapy.

## 2013-12-13 NOTE — Progress Notes (Signed)
HPI  This is a pleasant 53   -year-old male who is here for preoperative cardiovascular evaluation before gastric bypass surgery. He has known history of coronary artery disease status post CABG in 2004 with no recurrent ischemic events since then, chronic systolic heart failure with ejection fraction of 35-40 %, type 2 diabetes, end-stage renal disease previously on hemodialysis from 2004 2012 when he got a kidney transplant, hypertension, hyperlipidemia and sleep apnea. He was evaluated by me in February of 2014 when he was admitted to [facility] with atypical chest pain in the setting of bronchitis. He underwent an outpatient nuclear stress test which showed no evidence of ischemia. However, the ejection fraction was reported to be 31%.  Echocardiogram in February of 2015 showed an ejection fraction of 35-40% with grade 2 diastolic dysfunction and significantly dilated left atrium. He has been evaluated for gastric bypass surgery. He was having issues with hypercapnia and has been evaluated by pulmonary. He has been stable from a cardiac standpoint and denies any chest pain.   Allergies  Allergen Reactions  . Morphine     MORPHINE, puritis     Current Outpatient Prescriptions on File Prior to Visit  Medication Sig Dispense Refill  . albuterol (PROAIR HFA) 108 (90 BASE) MCG/ACT inhaler Inhale 2 puffs into the lungs 4 (four) times daily as needed for wheezing or shortness of breath (cough).  1 Inhaler  3  . aspirin 81 MG tablet Take 81 mg by mouth daily.      . beclomethasone (QVAR) 80 MCG/ACT inhaler Inhale 2 puffs into the lungs 2 (two) times daily.  3 Inhaler  3  . buPROPion (WELLBUTRIN XL) 150 MG 24 hr tablet Take 150 mg by mouth every morning.       Marland Kitchen CIALIS 20 MG tablet TAKE 1/2 TO 1 TABLET EVERY OTHER DAY AS NEEDED for erectile dysfunction.  5 tablet  3  . colchicine 0.6 MG tablet Take 1 tablet (0.6 mg total) by mouth 2 (two) times daily as needed.  60 tablet  2  . furosemide (LASIX) 40 MG  tablet Take 80 mg by mouth 2 (two) times daily.       Marland Kitchen glipiZIDE (GLUCOTROL) 10 MG tablet Take 1 tablet (10 mg total) by mouth 2 (two) times daily before a meal.  180 tablet  0  . HYDROcodone-acetaminophen (NORCO) 5-325 MG per tablet Take 1 tablet by mouth every 6 (six) hours as needed for moderate pain.  30 tablet  0  . insulin aspart (NOVOLOG) 100 UNIT/ML injection Inject 1-2 Units into the skin as needed.       . insulin glargine (LANTUS) 100 UNIT/ML injection Inject 120 Units into the skin at bedtime.       Marland Kitchen labetalol (NORMODYNE) 300 MG tablet Take 300 mg by mouth 2 (two) times daily.       . metolazone (ZAROXOLYN) 2.5 MG tablet Take 2.5 mg by mouth daily as needed (for excess fluid).       . Multiple Vitamin (MULTIVITAMIN WITH MINERALS) TABS tablet Take 1 tablet by mouth daily.      . mycophenolate (MYFORTIC) 180 MG EC tablet Take 720 mg by mouth 2 (two) times daily.       Marland Kitchen NASONEX 50 MCG/ACT nasal spray USE 2 SPRAYS IN EACH NOSTRIL DAILY  17 g  11  . omeprazole (PRILOSEC) 20 MG capsule TAKE 1 CAPSULE BY MOUTH DAILY.  30 capsule  5  . potassium chloride SA (K-DUR,KLOR-CON) 20 MEQ  tablet Take 60 mEq by mouth 2 (two) times daily.       . tacrolimus (PROGRAF) 1 MG capsule Take 1 mg by mouth 2 (two) times daily.       Marland Kitchen lisinopril (PRINIVIL,ZESTRIL) 5 MG tablet Take 5 mg by mouth at bedtime.       No current facility-administered medications on file prior to visit.     Past Medical History  Diagnosis Date  . Hypertension   . Hyperlipidemia     low since renal failure  . Kidney transplant status, cadaveric 2012  . Depression   . GERD (gastroesophageal reflux disease)   . Diabetes mellitus   . Gout   . Asthma   . ESRD (end stage renal disease) 2005    Dialysis then renal transplant  . CAD (coronary artery disease) 2005    had CABG  . CHF (congestive heart failure)   . Pneumonia   . Wears glasses   . Sleep apnea     bipap with oxygen 2 l     Past Surgical History    Procedure Laterality Date  . Kidney transplant  09/13/2010    cadaver--at [facility]  . Tympanic membrane repair  1/12    left  . Dg angio av shunt*l*      right and left upper arms  . Fasciotomy  03/03/2012    Procedure: FASCIOTOMY;  Surgeon: [name], MD;  Location: [place];  Service: Orthopedics;  Laterality: Right;  FASCIOTOMY RIGHT SMALL FINGER  . Kidney transplant    . Cardiac catheterization      [facility]  . Coronary artery bypass graft  2005    X 4  . Colonoscopy with propofol N/A 04/22/2013    Procedure: COLONOSCOPY WITH PROPOFOL;  Surgeon: [name], MD;  Location: [facility];  Service: Endoscopy;  Laterality: N/A;  . Fasciotomy Left 08/17/2013    Procedure: FASCIOTOMY LEFT RING;  Surgeon: [name], MD;  Location: [place];  Service: Orthopedics;  Laterality: Left;     Family History  Problem Relation Age of Onset  . Heart disease Father   . Diabetes Maternal Grandmother   . Breast cancer Maternal Grandmother   . Liver cancer Paternal Uncle   . Liver cancer Paternal Grandmother      History   Social History  . Marital Status: Married    Spouse Name: N/A    Number of Children: 2  . Years of Education: N/A   Occupational History  . 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 Smoking status: Never Smoker   . Smokeless tobacco: Never Used  . Alcohol Use: Yes     Comment: occasional  . Drug Use: No  . Sexual Activity: Not on file   Other Topics Concern  . Not on file   Social History Narrative   Has living will   Wife is health care POA   Would accept resuscitation attempts   Hasn't considered tube feedings      PHYSICAL EXAM   BP 184/105  Pulse 62  Ht _0  (1.803 m)  Wt 319 lb 8 oz (144.924 kg)  BMI 44.58 kg/m2 Constitutional: He is oriented to person, place, and time. He appears well-developed and well-nourished. No distress.  HENT: No nasal  discharge.  Head: Normocephalic and atraumatic.  Eyes: Pupils are equal and round.  No discharge. Neck: Normal range of motion.  Neck supple. No JVD present. No thyromegaly present.  Cardiovascular: Normal rate, regular rhythm, normal heart sounds. Exam reveals no gallop and no friction rub. No murmur heard.  Pulmonary/Chest: Effort normal and breath sounds normal. No stridor. No respiratory distress. He has no wheezes. He has no rales. He exhibits no tenderness.  Abdominal: Soft. Bowel sounds are normal. He exhibits no distension. There is no tenderness. There is no rebound and no guarding.  Musculoskeletal: Normal range of motion. He exhibits no edema and no tenderness.  Neurological: He is alert and oriented to person, place, and time. Coordination normal.  Skin: Skin is warm and dry. No rash noted. He is not diaphoretic. No erythema. No pallor.  Psychiatric: He has a normal mood and affect. His behavior is normal. Judgment and thought content normal.       OPR:AFOAD  Rhythm  -First degree A-V block  PRi = 240 -Left bundle branch block and left axis.   ABNORMAL    ASSESSMENT AND PLAN

## 2013-12-16 ENCOUNTER — Telehealth: Payer: Self-pay | Admitting: *Deleted

## 2013-12-16 NOTE — Telephone Encounter (Signed)
Patient stated he is currently in class  He started feeling dizzy, clammy and diaphoretic He said "he has never felt this bad before" He does not think it is diabetes related  He thinks it might be from the [place]  He is unsure of his current blood sugar   Discussed patient symptoms with [name]  [name] recommends patient go to the nearest urgent care to have his vitals and blood sugar checked  Patient verbalized understanding   Instructed patient to contact EMS if he feels his situation becomes emergent

## 2013-12-16 NOTE — Telephone Encounter (Signed)
Patient called and he thinks he is having a reaction to new medicine Coreg.

## 2013-12-20 ENCOUNTER — Telehealth: Payer: Self-pay | Admitting: *Deleted

## 2013-12-20 MED ORDER — CARVEDILOL 3.125 MG PO TABS: 3.1250 mg | ORAL_TABLET | Freq: Two times a day (BID) | ORAL | Status: DC

## 2013-12-20 NOTE — Telephone Encounter (Signed)
See 12/20/13 phone note

## 2013-12-20 NOTE — Telephone Encounter (Signed)
Patient still struggling with bp 89/50, sweating and dizzy. Please call.

## 2013-12-20 NOTE — Telephone Encounter (Signed)
Informed patient per Dr Fletcher Anon:  Hold Coreg and Lisinopril tonight  If blood pressure is back to normal tomorrow am  Restart Coreg at 3.125 mg BID  Continue to monitor  Call if BP remains low  Patient verbalized understanding

## 2013-12-20 NOTE — Telephone Encounter (Signed)
Patient called and stated he went to Urgent care last week as advised  His blood pressure was low  He bought a cuff and checked it over the weekend and his blood pressure was averaging 90/50 Today it is 89/50 and he is sweating and dizzy

## 2014-01-03 ENCOUNTER — Ambulatory Visit (INDEPENDENT_AMBULATORY_CARE_PROVIDER_SITE_OTHER): Payer: 59 | Admitting: Pulmonary Disease

## 2014-01-03 ENCOUNTER — Encounter: Payer: Self-pay | Admitting: Pulmonary Disease

## 2014-01-03 VITALS — BP 154/86 | HR 81 | Ht 71.0 in | Wt 323.0 lb

## 2014-01-03 DIAGNOSIS — E662 Morbid (severe) obesity with alveolar hypoventilation: Secondary | ICD-10-CM

## 2014-01-03 DIAGNOSIS — Z23 Encounter for immunization: Secondary | ICD-10-CM

## 2014-01-03 DIAGNOSIS — Z01811 Encounter for preprocedural respiratory examination: Secondary | ICD-10-CM

## 2014-01-03 DIAGNOSIS — J45998 Other asthma: Secondary | ICD-10-CM

## 2014-01-03 DIAGNOSIS — G4733 Obstructive sleep apnea (adult) (pediatric): Secondary | ICD-10-CM

## 2014-01-03 NOTE — Assessment & Plan Note (Signed)
This has been a stable interval for him. His flu shot is up-to-date.  It sounds like he could taper off of the Qvar in the summertime and in the winter as the spring and the fall are worse for him. We talked about this strategy and for now (because of all the ragweed out) he is going to continue 2 puffs twice a day.  Plan: -Continue Qvar as above -Provera vaccine given today -Followup 6 months

## 2014-01-03 NOTE — Assessment & Plan Note (Signed)
Continue BiPAP 14/9 with 2 L of oxygen bleed in. I will prescribe new tubing and a mask as these have been problematic for him recently. He needs to continue using this nightly as well as in the perioperative period.

## 2014-01-03 NOTE — Patient Instructions (Signed)
Keep using the QVar as you are doing Use your CPAP machine (BIPAP) every night. We will see you back in 6 month or sooner if needed

## 2014-01-03 NOTE — Progress Notes (Signed)
Subjective:    Patient ID: [name], male    DOB: [birth year], 53 y.o.   MRN: [id number]  Synopsis: [name] was previously followed by Dr. Halford Chessman for asthma and obesity hypoventilation syndrome with OSA.  History of renal transplant. PSG 03/15/12 >> AHI 57.9, low SpO2 78%. CPAP 12 cm H2O  CT chest [facility]) 09/24/12 >> central bronchial thickening, LUL pleural scarring  V/Q scan [facility]) 09/24/12 >> matched defect Lt upper lobe  ABG [facility]) 09/25/12 >> pH 7.40, PaCO2 68.2, PaO2 61.5  Echo bubble study [facility]) 09/26/12 >> poor study, no identified shunt  Room air SpO2 10/27/12 >> 87%, start 2 liters oxygen 24/7  ONO with CPAP and RA 11/02/12 >> Test time 1 hr 22 min. Mean SpO2 76%, low SpO2 59%. Spent 1 hr 16 min with SpO2 < 88%.  CPAP 10/15/12 to 11/13/12 >> used on 30 of 30 nights with average 4 hrs 22 min. Average AHI 3.8 with CPAP 12 cm H2O.  PFT 11/27/12 >> FEV1 1.74, FEV1% 80%, TLC 4.16 (60%), DLCO 81%, +BD  CPAP 11/16/12 to 12/15/12 >> Used on 30 of 30 nights with average 3 hrs 56 min. Average AHI 3.7 with CPAP 12 cm H2O.  ONO with CPAP and 2 liters 11/27/12 >> Test time 4 hrs 39 min. Basal SpO2 88%, low SpO2 67%. Spent 117 min with SpO2 < 88%  Echo 04/27/13 >> EF 35 to 40%, mod LVH, grade 2 diastolic dysfunction, severe LA dilation, mod RA dilation  ABG on room air 05/12/13 >> pH 7.41, PaCO2 70, PaO2 < 43  BiPAP 06/21/13 >> 14/9 cm H2O with 2 liters oxygen >> AHI 0.   HPI  Chief Complaint  Patient presents with  . Advice Only    Switching from VS to BQ.  Wearing cpap with 2l oxygen qhs.  States the hose connecting to his mask keeps popping off while he's sleeping X1 month.  No other complaints today.      01/03/2014 ROV > [name] has not been back to the bariatric surgeon.  His breathing has been stable.  He has been having trouble with the hose on his machine has been detaching from his machine.  He is using the QVar once a day and hasn't used albterol since June.  He typically  gets more asthma symptoms with chest congestion and dyspnea in the spring and late fall.  He also gets sinus congestion and in the spring and fall so he uses nascaort .  Lately he has not had much trouble with breathing. 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 He is planning to have bariatric surgery in January of 2015. He continues to use his BiPAP machine nightly. He is a bit frustrated by the tubing disconnected from the mask. Apparently he cannot get this changed for another month.  Past Medical History  Diagnosis Date  . Hypertension   . Hyperlipidemia     low since renal failure  . Kidney transplant status, cadaveric 2012  . Depression   . GERD (gastroesophageal reflux disease)   . Diabetes mellitus   . Gout   . Asthma   . ESRD (end stage renal disease) 2005    Dialysis then renal transplant  . CAD (coronary artery disease) 2005    had CABG  . CHF (congestive heart failure)   . Pneumonia   . Wears glasses   . Sleep apnea  bipap with oxygen 2 l     Review of Systems  Constitutional: Negative for fever and unexpected weight change.  HENT: Negative for congestion, dental problem, ear pain, nosebleeds, postnasal drip, rhinorrhea, sinus pressure, sneezing, sore throat and trouble swallowing.   Eyes: Negative for redness and itching.  Respiratory: Positive for shortness of breath. Negative for cough, chest tightness and wheezing.   Cardiovascular: Negative for palpitations and leg swelling.  Gastrointestinal: Negative for nausea and vomiting.  Genitourinary: Negative for dysuria.  Musculoskeletal: Negative for joint swelling.  Skin: Negative for rash.  Neurological: Negative for headaches.  Hematological: Does not bruise/bleed easily.  Psychiatric/Behavioral: Negative for dysphoric mood. The patient is not nervous/anxious.        Objective:   Physical Exam Filed Vitals:   01/03/14 0957  BP: 154/86  Pulse: 81  Height: _0   (1.803 m)  Weight: 323 lb (146.512 kg)  SpO2: 94%   RA  Gen: obese but well appearing, no acute distress HEENT: NCAT, EOMi, OP clear, neck supple without masses PULM: CTA B CV: RRR, no mgr, no JVD AB: BS+, soft, nontender, no hsm Ext: warm, trace chronic appearing pretibial edema, no clubbing, no cyanosis Derm: no rash or skin breakdown Neuro: A&Ox4, CN II-XII intact, strength 5/5 in all 4 extremities      Assessment & Plan:   Pre-operative respiratory examination He is considering bariatric surgery. Many of his respiratory symptoms are related directly to his weight. Specifically he has obesity hypoventilation syndrome and has asthma are related to his weight. I believe that surgery would be a good option for him. I agree with my partners previous assessment which stated that his pulmonary regimen is currently optimized. He is at slightly increased risk for perioperative pulmonary complications but these are not high enough for him to forego the procedure.  If he needs to undergo surgery then he should continue his inhaler regimen and should continue using BiPAP with 2 L of supplemental oxygen after surgery.  Asthma, persistent controlled This has been a stable interval for him. His flu shot is up-to-date.  It sounds like he could taper off of the Qvar in the summertime and in the winter as the spring and the fall are worse for him. We talked about this strategy and for now (because of all the ragweed out) he is going to continue 2 puffs twice a day.  Plan: -Continue Qvar as above -Provera vaccine given today -Followup 6 months  Obstructive sleep apnea Continue BiPAP 14/9 with 2 L of oxygen bleed in. I will prescribe new tubing and a mask as these have been problematic for him recently. He needs to continue using this nightly as well as in the perioperative period.  Obesity hypoventilation syndrome  As above    Updated Medication List Outpatient Encounter Prescriptions as  of 01/03/2014  Medication Sig  . albuterol (PROAIR HFA) 108 (90 BASE) MCG/ACT inhaler Inhale 2 puffs into the lungs 4 (four) times daily as needed for wheezing or shortness of breath (cough).  Marland Kitchen aspirin 81 MG tablet Take 81 mg by mouth daily.  . beclomethasone (QVAR) 80 MCG/ACT inhaler Inhale 2 puffs into the lungs 2 (two) times daily.  Marland Kitchen buPROPion (WELLBUTRIN XL) 150 MG 24 hr tablet Take 150 mg by mouth every morning.   . carvedilol (COREG) 3.125 MG tablet Take 1 tablet (3.125 mg total) by mouth 2 (two) times daily.  Marland Kitchen CIALIS 20 MG tablet TAKE 1/2 TO 1 TABLET EVERY OTHER DAY AS  NEEDED for erectile dysfunction.  . colchicine 0.6 MG tablet Take 1 tablet (0.6 mg total) by mouth 2 (two) times daily as needed.  . furosemide (LASIX) 40 MG tablet Take 80 mg by mouth 2 (two) times daily.   Marland Kitchen glipiZIDE (GLUCOTROL) 10 MG tablet Take 1 tablet (10 mg total) by mouth 2 (two) times daily before a meal.  . HYDROcodone-acetaminophen (NORCO) 5-325 MG per tablet Take 1 tablet by mouth every 6 (six) hours as needed for moderate pain.  Marland Kitchen insulin aspart (NOVOLOG) 100 UNIT/ML injection Inject 5 Units into the skin as needed.   . insulin glargine (LANTUS) 100 UNIT/ML injection Inject 120 Units into the skin at bedtime.   Marland Kitchen lisinopril (PRINIVIL,ZESTRIL) 10 MG tablet Take 1 tablet (10 mg total) by mouth daily.  . metolazone (ZAROXOLYN) 2.5 MG tablet Take 2.5 mg by mouth daily as needed (for excess fluid).   . Multiple Vitamin (MULTIVITAMIN WITH MINERALS) TABS tablet Take 1 tablet by mouth daily.  . mycophenolate (MYFORTIC) 180 MG EC tablet Take 720 mg by mouth 2 (two) times daily.   Marland Kitchen NASONEX 50 MCG/ACT nasal spray USE 2 SPRAYS IN EACH NOSTRIL DAILY  . omeprazole (PRILOSEC) 20 MG capsule TAKE 1 CAPSULE BY MOUTH DAILY.  Marland Kitchen potassium chloride SA (K-DUR,KLOR-CON) 20 MEQ tablet Take 60 mEq by mouth 2 (two) times daily.   . tacrolimus (PROGRAF) 1 MG capsule Take 1 mg by mouth 2 (two) times daily.

## 2014-01-03 NOTE — Assessment & Plan Note (Signed)
As above

## 2014-01-03 NOTE — Assessment & Plan Note (Signed)
He is considering bariatric surgery. Many of his respiratory symptoms are related directly to his weight. Specifically he has obesity hypoventilation syndrome and has asthma are related to his weight. I believe that surgery would be a good option for him. I agree with my partners previous assessment which stated that his pulmonary regimen is currently optimized. He is at slightly increased risk for perioperative pulmonary complications but these are not high enough for him to forego the procedure.  If he needs to undergo surgery then he should continue his inhaler regimen and should continue using BiPAP with 2 L of supplemental oxygen after surgery.

## 2014-01-17 ENCOUNTER — Ambulatory Visit (INDEPENDENT_AMBULATORY_CARE_PROVIDER_SITE_OTHER): Payer: Medicare Other | Admitting: Cardiovascular Disease

## 2014-01-17 ENCOUNTER — Encounter: Payer: Self-pay | Admitting: Cardiovascular Disease

## 2014-01-17 VITALS — BP 148/80 | HR 64 | Ht 71.5 in | Wt 318.5 lb

## 2014-01-17 DIAGNOSIS — E785 Hyperlipidemia, unspecified: Secondary | ICD-10-CM

## 2014-01-17 DIAGNOSIS — I2510 Atherosclerotic heart disease of native coronary artery without angina pectoris: Secondary | ICD-10-CM

## 2014-01-17 DIAGNOSIS — I10 Essential (primary) hypertension: Secondary | ICD-10-CM

## 2014-01-17 DIAGNOSIS — I5022 Chronic systolic (congestive) heart failure: Secondary | ICD-10-CM

## 2014-01-17 DIAGNOSIS — Z01818 Encounter for other preprocedural examination: Secondary | ICD-10-CM

## 2014-01-17 NOTE — Assessment & Plan Note (Signed)
Lab Results  Component Value Date   CHOL 178 09/09/2013   HDL 25* 09/09/2013   LDLCALC 88 09/09/2013   LDLDIRECT 41.9 04/20/2012   TRIG 423* 09/09/2013   CHOLHDL 7 04/20/2012

## 2014-01-17 NOTE — Progress Notes (Signed)
HPI  This is a pleasant 52 year-old male who is here today for a follow up visit. He has known history of coronary artery disease status post CABG in 2004 with no recurrent ischemic events since then, chronic systolic heart failure with ejection fraction of 35-40 %, type 2 diabetes, end-stage renal disease previously on hemodialysis from 2004 - 2012 when he got a kidney transplant, hypertension, hyperlipidemia and sleep apnea. He was evaluated by me in February of 2014 when he was admitted to [facility] with atypical chest pain in the setting of bronchitis. He underwent an outpatient nuclear stress test which showed no evidence of ischemia. However, the ejection fraction was reported to be 31%.  Echocardiogram in February of 2015 showed an ejection fraction of 35-40% with grade 2 diastolic dysfunction and significantly dilated left atrium. He has been evaluated for gastric bypass surgery. He was having issues with hypercapnia and has been evaluated by pulmonary. During last visit, I switched Labetalol to carvedilol and increased lisinopril due to elevated blood pressure. He is doing well and denies chest pain or significant dyspnea.    Allergies  Allergen Reactions  . Morphine     MORPHINE, puritis     Current Outpatient Prescriptions on File Prior to Visit  Medication Sig Dispense Refill  . albuterol (PROAIR HFA) 108 (90 BASE) MCG/ACT inhaler Inhale 2 puffs into the lungs 4 (four) times daily as needed for wheezing or shortness of breath (cough).  1 Inhaler  3  . aspirin 81 MG tablet Take 81 mg by mouth daily.      . beclomethasone (QVAR) 80 MCG/ACT inhaler Inhale 2 puffs into the lungs 2 (two) times daily.  3 Inhaler  3  . buPROPion (WELLBUTRIN XL) 150 MG 24 hr tablet Take 150 mg by mouth every morning.       . carvedilol (COREG) 3.125 MG tablet Take 1 tablet (3.125 mg total) by mouth 2 (two) times daily.  60 tablet  6  . CIALIS 20 MG tablet TAKE 1/2 TO 1 TABLET EVERY OTHER DAY AS NEEDED for  erectile dysfunction.  5 tablet  3  . colchicine 0.6 MG tablet Take 1 tablet (0.6 mg total) by mouth 2 (two) times daily as needed.  60 tablet  2  . furosemide (LASIX) 40 MG tablet Take 80 mg by mouth 2 (two) times daily.       Marland Kitchen glipiZIDE (GLUCOTROL) 10 MG tablet Take 1 tablet (10 mg total) by mouth 2 (two) times daily before a meal.  180 tablet  0  . HYDROcodone-acetaminophen (NORCO) 5-325 MG per tablet Take 1 tablet by mouth every 6 (six) hours as needed for moderate pain.  30 tablet  0  . insulin aspart (NOVOLOG) 100 UNIT/ML injection Inject 5 Units into the skin as needed.       . insulin glargine (LANTUS) 100 UNIT/ML injection Inject 120 Units into the skin at bedtime.       Marland Kitchen lisinopril (PRINIVIL,ZESTRIL) 10 MG tablet Take 1 tablet (10 mg total) by mouth daily.  30 tablet  6  . metolazone (ZAROXOLYN) 2.5 MG tablet Take 2.5 mg by mouth daily as needed (for excess fluid).       . Multiple Vitamin (MULTIVITAMIN WITH MINERALS) TABS tablet Take 1 tablet by mouth daily.      . mycophenolate (MYFORTIC) 180 MG EC tablet Take 720 mg by mouth 2 (two) times daily.       Marland Kitchen NASONEX 50 MCG/ACT nasal spray USE 2  SPRAYS IN EACH NOSTRIL DAILY  17 g  11  . omeprazole (PRILOSEC) 20 MG capsule TAKE 1 CAPSULE BY MOUTH DAILY.  30 capsule  5  . potassium chloride SA (K-DUR,KLOR-CON) 20 MEQ tablet Take 60 mEq by mouth 2 (two) times daily.       . tacrolimus (PROGRAF) 1 MG capsule Take 1 mg by mouth 2 (two) times daily.        No current facility-administered medications on file prior to visit.     Past Medical History  Diagnosis Date  . Hypertension   . Hyperlipidemia     low since renal failure  . Kidney transplant status, cadaveric 2012  . Depression   . GERD (gastroesophageal reflux disease)   . Diabetes mellitus   . Gout   . Asthma   . ESRD (end stage renal disease) 2005    Dialysis then renal transplant  . CAD (coronary artery disease) 2005    had CABG  . CHF (congestive heart failure)   .  Pneumonia   . Wears glasses   . Sleep apnea     bipap with oxygen 2 l     Past Surgical History  Procedure Laterality Date  . Kidney transplant  09/13/2010    cadaver--at [facility]  . Tympanic membrane repair  1/12    left  . Dg angio av shunt*l*      right and left upper arms  . Fasciotomy  03/03/2012    Procedure: FASCIOTOMY;  Surgeon: [name], MD;  Location: [facility];  Service: Orthopedics;  Laterality: Right;  FASCIOTOMY RIGHT SMALL FINGER  . Kidney transplant    . Cardiac catheterization      [facility]  . Coronary artery bypass graft  2005    X 4  . Colonoscopy with propofol N/A 04/22/2013    Procedure: COLONOSCOPY WITH PROPOFOL;  Surgeon: [name], MD;  Location: [facility];  Service: Endoscopy;  Laterality: N/A;  . Fasciotomy Left 08/17/2013    Procedure: FASCIOTOMY LEFT RING;  Surgeon: [name], MD;  Location: [place];  Service: Orthopedics;  Laterality: Left;     Family History  Problem Relation Age of Onset  . Heart disease Father   . Diabetes Maternal Grandmother   . Breast cancer Maternal Grandmother   . Liver cancer Paternal Uncle   . Liver cancer Paternal Grandmother      History   Social History  . Marital Status: Married    Spouse Name: N/A    Number of Children: 2  . Years of Education: N/A   Occupational History  . 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 Smoking status: Never Smoker   . Smokeless tobacco: Never Used  . Alcohol Use: Yes     Comment: occasional  . Drug Use: No  . Sexual Activity: Not on file   Other Topics Concern  . Not on file   Social History Narrative   Has living will   Wife is health care POA   Would accept resuscitation attempts   Hasn't considered tube feedings      PHYSICAL EXAM   BP 148/80  Pulse 64  Ht 5' 11.5" (1.816 m)  Wt 318 lb 8 oz (144.471 kg)  BMI 43.81 kg/m2 Constitutional: He is oriented to  person, place, and time. He appears well-developed and well-nourished. No distress.  HENT: No nasal discharge.  Head: Normocephalic  and atraumatic.  Eyes: Pupils are equal and round.  No discharge. Neck: Normal range of motion. Neck supple. No JVD present. No thyromegaly present.  Cardiovascular: Normal rate, regular rhythm, normal heart sounds. Exam reveals no gallop and no friction rub. No murmur heard.  Pulmonary/Chest: Effort normal and breath sounds normal. No stridor. No respiratory distress. He has no wheezes. He has no rales. He exhibits no tenderness.  Abdominal: Soft. Bowel sounds are normal. He exhibits no distension. There is no tenderness. There is no rebound and no guarding.  Musculoskeletal: Normal range of motion. He exhibits no edema and no tenderness.  Neurological: He is alert and oriented to person, place, and time. Coordination normal.  Skin: Skin is warm and dry. No rash noted. He is not diaphoretic. No erythema. No pallor.  Psychiatric: He has a normal mood and affect. His behavior is normal. Judgment and thought content normal.       FBS:JXCVG  Rhythm  -First degree A-V block  PRi = 236 -Intraventricular conduction defect and left axis -possible anterior fascicular block   consider ventricular hypertrophy.   ABNORMAL    ASSESSMENT AND PLAN

## 2014-01-17 NOTE — Assessment & Plan Note (Signed)
No angina. Continue medical therapy.

## 2014-01-17 NOTE — Patient Instructions (Signed)
You are at low to moderate risk for surgery from a cardiac standpoint.   Continue same medications.   Your physician wants you to follow-up in: 6 months.  You will receive a reminder letter in the mail two months in advance. If you don't receive a letter, please call our office to schedule the follow-up appointment.  Your next appointment will be scheduled in our new office located at :  [facility]  [address] [place]  [place], [place] [address]

## 2014-01-17 NOTE — Assessment & Plan Note (Signed)
BP is more controlled now.

## 2014-01-17 NOTE — Assessment & Plan Note (Signed)
He is currently New [name] Heart Association class II. He appears to be euvolemic. His blood pressure is more controlled after adjusting his BP meds.  He is at low to moderate risk from a cardiac standpoint for planned bariatric surgery.  He is status post kidney transplant being followed at [facility]. Most recent creatinine was 1.2.

## 2014-01-28 ENCOUNTER — Encounter: Payer: Self-pay | Admitting: Internal Medicine

## 2014-01-28 ENCOUNTER — Ambulatory Visit (INDEPENDENT_AMBULATORY_CARE_PROVIDER_SITE_OTHER): Payer: Medicare Other | Admitting: Internal Medicine

## 2014-01-28 ENCOUNTER — Other Ambulatory Visit: Payer: Self-pay | Admitting: Bariatrics

## 2014-01-28 ENCOUNTER — Ambulatory Visit: Payer: Self-pay | Admitting: Bariatrics

## 2014-01-28 VITALS — BP 130/70 | HR 76 | Temp 98.0°F | Wt 319.0 lb

## 2014-01-28 DIAGNOSIS — I2510 Atherosclerotic heart disease of native coronary artery without angina pectoris: Secondary | ICD-10-CM

## 2014-01-28 DIAGNOSIS — I8312 Varicose veins of left lower extremity with inflammation: Secondary | ICD-10-CM

## 2014-01-28 DIAGNOSIS — I8311 Varicose veins of right lower extremity with inflammation: Secondary | ICD-10-CM

## 2014-01-28 DIAGNOSIS — I872 Venous insufficiency (chronic) (peripheral): Secondary | ICD-10-CM

## 2014-01-28 DIAGNOSIS — I10 Essential (primary) hypertension: Secondary | ICD-10-CM

## 2014-01-28 LAB — AMYLASE: Amylase: 18 U/L — ABNORMAL LOW (ref 25–115)

## 2014-01-28 LAB — COMPREHENSIVE METABOLIC PANEL
ALBUMIN: 3.3 g/dL — AB (ref 3.4–5.0)
ALK PHOS: 141 U/L — AB
ALT: 34 U/L
ANION GAP: 8 (ref 7–16)
AST: 22 U/L (ref 15–37)
BUN: 34 mg/dL — AB (ref 7–18)
Bilirubin,Total: 0.5 mg/dL (ref 0.2–1.0)
CALCIUM: 8.7 mg/dL (ref 8.5–10.1)
CO2: 37 mmol/L — AB (ref 21–32)
CREATININE: 1.51 mg/dL — AB (ref 0.60–1.30)
Chloride: 89 mmol/L — ABNORMAL LOW (ref 98–107)
EGFR (African American): >60
EGFR (Non-African Amer.): 51 — ABNORMAL LOW
GLUCOSE: 417 mg/dL — AB (ref 65–99)
Osmolality: 294 (ref 275–301)
POTASSIUM: 2.9 mmol/L — AB (ref 3.5–5.1)
SODIUM: 134 mmol/L — AB (ref 136–145)
TOTAL PROTEIN: 7.3 g/dL (ref 6.4–8.2)

## 2014-01-28 LAB — CBC WITH DIFFERENTIAL/PLATELET
BASOS ABS: 0.1 10*3/uL (ref 0.0–0.1)
Basophil %: 1.2 %
Eosinophil #: 0.1 10*3/uL (ref 0.0–0.7)
Eosinophil %: 1.2 %
HCT: 47.9 % (ref 40.0–52.0)
HGB: 16.2 g/dL (ref 13.0–18.0)
Lymphocyte #: 0.8 10*3/uL — ABNORMAL LOW (ref 1.0–3.6)
Lymphocyte %: 10.3 %
MCH: 28.2 pg (ref 26.0–34.0)
MCHC: 33.9 g/dL (ref 32.0–36.0)
MCV: 83 fL (ref 80–100)
MONOS PCT: 7.7 %
Monocyte #: 0.6 x10 3/mm (ref 0.2–1.0)
Neutrophil #: 5.8 10*3/uL (ref 1.4–6.5)
Neutrophil %: 79.6 %
Platelet: 198 10*3/uL (ref 150–440)
RBC: 5.76 10*6/uL (ref 4.40–5.90)
RDW: 14.4 % (ref 11.5–14.5)
WBC: 7.3 10*3/uL (ref 3.8–10.6)

## 2014-01-28 LAB — HEPATIC FUNCTION PANEL A (ARMC)
Albumin: 3.4 g/dL (ref 3.4–5.0)
Alkaline Phosphatase: 141 U/L — ABNORMAL HIGH
BILIRUBIN TOTAL: 0.5 mg/dL (ref 0.2–1.0)
Bilirubin, Direct: <0.1 mg/dL (ref 0.0–0.2)
SGOT(AST): 32 U/L (ref 15–37)
SGPT (ALT): 34 U/L
Total Protein: 7.3 g/dL (ref 6.4–8.2)

## 2014-01-28 LAB — HEMOGLOBIN A1C: HEMOGLOBIN A1C: 9.7 % — AB (ref 4.2–6.3)

## 2014-01-28 LAB — PROTIME-INR
INR: 1.1
PROTHROMBIN TIME: 14.1 s (ref 11.5–14.7)

## 2014-01-28 LAB — IRON AND TIBC
IRON BIND. CAP.(TOTAL): 356 ug/dL (ref 250–450)
Iron Saturation: 15 %
Iron: 54 ug/dL — ABNORMAL LOW (ref 65–175)
UNBOUND IRON-BIND. CAP.: 302 ug/dL

## 2014-01-28 LAB — MAGNESIUM: MAGNESIUM: 1.9 mg/dL

## 2014-01-28 LAB — APTT: Activated PTT: 31.7 secs (ref 23.6–35.9)

## 2014-01-28 LAB — FERRITIN: Ferritin (ARMC): 47 ng/mL (ref 8–388)

## 2014-01-28 LAB — LIPASE, BLOOD: Lipase: 132 U/L (ref 73–393)

## 2014-01-28 LAB — TSH: Thyroid Stimulating Horm: 1.13 u[IU]/mL

## 2014-01-28 LAB — PHOSPHORUS: PHOSPHORUS: 2.5 mg/dL (ref 2.5–4.9)

## 2014-01-28 LAB — FOLATE: Folic Acid: 18.1 ng/mL (ref 3.1–100.0)

## 2014-01-28 NOTE — Assessment & Plan Note (Signed)
Reassured that there are no worrisome lesions Edema is better--- discussed elevation, compression socks for when he has prolonged standing

## 2014-01-28 NOTE — Progress Notes (Signed)
Subjective:    Patient ID: [name], male    DOB: Mar 14, 1961, 53 y.o.   MRN: [id number]  HPI Hopefully will have bariatric surgery next month  Having some spots on his legs "Black blemishes" Spot on right shoulder Worried about cancer Couldn't get in with dermatologist  Some swelling in legs--not bad No itchy Some inflammation around lesion on upper back (shoulder). May be getting larger  Current Outpatient Prescriptions on File Prior to Visit  Medication Sig Dispense Refill  . albuterol (PROAIR HFA) 108 (90 BASE) MCG/ACT inhaler Inhale 2 puffs into the lungs 4 (four) times daily as needed for wheezing or shortness of breath (cough). 1 Inhaler 3  . aspirin 81 MG tablet Take 81 mg by mouth daily.    . beclomethasone (QVAR) 80 MCG/ACT inhaler Inhale 2 puffs into the lungs 2 (two) times daily. 3 Inhaler 3  . buPROPion (WELLBUTRIN XL) 150 MG 24 hr tablet Take 150 mg by mouth every morning.     . carvedilol (COREG) 3.125 MG tablet Take 1 tablet (3.125 mg total) by mouth 2 (two) times daily. 60 tablet 6  . CIALIS 20 MG tablet TAKE 1/2 TO 1 TABLET EVERY OTHER DAY AS NEEDED for erectile dysfunction. 5 tablet 3  . colchicine 0.6 MG tablet Take 1 tablet (0.6 mg total) by mouth 2 (two) times daily as needed. 60 tablet 2  . furosemide (LASIX) 40 MG tablet Take 80 mg by mouth 2 (two) times daily.     Marland Kitchen glipiZIDE (GLUCOTROL) 10 MG tablet Take 1 tablet (10 mg total) by mouth 2 (two) times daily before a meal. 180 tablet 0  . HYDROcodone-acetaminophen (NORCO) 5-325 MG per tablet Take 1 tablet by mouth every 6 (six) hours as needed for moderate pain. 30 tablet 0  . insulin aspart (NOVOLOG) 100 UNIT/ML injection Inject 5 Units into the skin as needed.     . insulin glargine (LANTUS) 100 UNIT/ML injection Inject 120 Units into the skin at bedtime.     Marland Kitchen lisinopril (PRINIVIL,ZESTRIL) 10 MG tablet Take 1 tablet (10 mg total) by mouth daily. 30 tablet 6  . metolazone (ZAROXOLYN) 2.5 MG tablet Take  2.5 mg by mouth daily as needed (for excess fluid).     . Multiple Vitamin (MULTIVITAMIN WITH MINERALS) TABS tablet Take 1 tablet by mouth daily.    . mycophenolate (MYFORTIC) 180 MG EC tablet Take 720 mg by mouth 2 (two) times daily.     Marland Kitchen NASONEX 50 MCG/ACT nasal spray USE 2 SPRAYS IN EACH NOSTRIL DAILY 17 g 11  . omeprazole (PRILOSEC) 20 MG capsule TAKE 1 CAPSULE BY MOUTH DAILY. 30 capsule 5  . potassium chloride SA (K-DUR,KLOR-CON) 20 MEQ tablet Take 60 mEq by mouth 2 (two) times daily.     . tacrolimus (PROGRAF) 1 MG capsule Take 1 mg by mouth 2 (two) times daily.      No current facility-administered medications on file prior to visit.    Allergies  Allergen Reactions  . Morphine     MORPHINE, puritis    Past Medical History  Diagnosis Date  . Hypertension   . Hyperlipidemia     low since renal failure  . Kidney transplant status, cadaveric 2012  . Depression   . GERD (gastroesophageal reflux disease)   . Diabetes mellitus   . Gout   . Asthma   . ESRD (end stage renal disease) 2005    Dialysis then renal transplant  . CAD (coronary artery disease)  2005    had CABG  . CHF (congestive heart failure)   . Pneumonia   . Wears glasses   . Sleep apnea     bipap with oxygen 2 l    Past Surgical History  Procedure Laterality Date  . Kidney transplant  09/13/2010    cadaver--at [facility]  . Tympanic membrane repair  1/12    left  . Dg angio av shunt*l*      right and left upper arms  . Fasciotomy  03/03/2012    Procedure: FASCIOTOMY;  Surgeon: [name], MD;  Location: [facility];  Service: Orthopedics;  Laterality: Right;  FASCIOTOMY RIGHT SMALL FINGER  . Kidney transplant    . Cardiac catheterization      [facility]  . Coronary artery bypass graft  2005    X 4  . Colonoscopy with propofol N/A 04/22/2013    Procedure: COLONOSCOPY WITH PROPOFOL;  Surgeon: [name], MD;  Location: [facility];  Service: Endoscopy;  Laterality: N/A;  . Fasciotomy Left  08/17/2013    Procedure: FASCIOTOMY LEFT RING;  Surgeon: [name], MD;  Location: [facility] [place];  Service: Orthopedics;  Laterality: Left;    Family History  Problem Relation Age of Onset  . Heart disease Father   . Diabetes Maternal Grandmother   . Breast cancer Maternal Grandmother   . Liver cancer Paternal Uncle   . Liver cancer Paternal Grandmother     History   Social History  . Marital Status: Married    Spouse Name: N/A    Number of Children: 2  . Years of Education: N/A   Occupational History  . 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 Smoking status: Never Smoker   . Smokeless tobacco: Never Used  . Alcohol Use: Yes     Comment: occasional  . Drug Use: No  . Sexual Activity: Not on file   Other Topics Concern  . Not on file   Social History Narrative   Has living will   Wife is health care POA   Would accept resuscitation attempts   Hasn't considered tube feedings   Review of Systems  Diabetes okay Last A1c 7.8%     Objective:   Physical Exam  Constitutional: He appears well-developed. No distress.  Skin:  Stasis dermatitis with a couple of prominent macular lesions on mid right calf. Several benign nodules on left calf Many benign nevi (1-66m) on upper back          Assessment & Plan:

## 2014-01-28 NOTE — Progress Notes (Signed)
Pre visit review using our clinic review tool, if applicable. No additional management support is needed unless otherwise documented below in the visit note.

## 2014-01-31 ENCOUNTER — Telehealth: Payer: Self-pay | Admitting: Internal Medicine

## 2014-01-31 NOTE — Telephone Encounter (Signed)
emmi emailed °

## 2014-02-02 ENCOUNTER — Ambulatory Visit
Admission: RE | Admit: 2014-02-02 | Discharge: 2014-02-02 | Disposition: A | Discharge status: Home / Self Care | Payer: Medicare Other | Source: Ambulatory Visit | Attending: Bariatrics | Admitting: Bariatrics

## 2014-02-02 ENCOUNTER — Telehealth: Payer: Self-pay | Admitting: Cardiovascular Disease

## 2014-02-02 ENCOUNTER — Encounter: Payer: Self-pay | Admitting: *Deleted

## 2014-02-02 DIAGNOSIS — I10 Essential (primary) hypertension: Secondary | ICD-10-CM

## 2014-02-02 NOTE — Telephone Encounter (Signed)
Pt needs a clearance letter for bariatric surgery. Once done could we fax it over to [phone], [place]. Attention to crystal. Please call pt when done or if we have questions.

## 2014-02-02 NOTE — Telephone Encounter (Signed)
Clearance letter and office note faxed as requested

## 2014-02-02 NOTE — Telephone Encounter (Signed)
He is at low to moderate risk from a cardiac standpoint. Fax my last office note as well.

## 2014-02-21 ENCOUNTER — Encounter: Payer: Self-pay | Admitting: Internal Medicine

## 2014-02-21 LAB — HEMOGLOBIN A1C: HEMOGLOBIN A1C: 8.6

## 2014-02-22 ENCOUNTER — Ambulatory Visit: Payer: Self-pay

## 2014-03-07 ENCOUNTER — Ambulatory Visit: Payer: Self-pay | Admitting: Bariatrics

## 2014-03-15 ENCOUNTER — Inpatient Hospital Stay: Payer: Self-pay | Admitting: Bariatrics

## 2014-03-16 ENCOUNTER — Telehealth: Payer: Self-pay | Admitting: Pulmonary Disease

## 2014-03-16 DIAGNOSIS — G4733 Obstructive sleep apnea (adult) (pediatric): Secondary | ICD-10-CM

## 2014-03-16 LAB — BASIC METABOLIC PANEL
ANION GAP: 1 — AB (ref 7–16)
BUN: 25 mg/dL — ABNORMAL HIGH (ref 7–18)
CHLORIDE: 99 mmol/L (ref 98–107)
CREATININE: 1.62 mg/dL — AB (ref 0.60–1.30)
Calcium, Total: 8.4 mg/dL — ABNORMAL LOW (ref 8.5–10.1)
Co2: 39 mmol/L — ABNORMAL HIGH (ref 21–32)
GFR CALC AF AMER: 57 — AB
GFR CALC NON AF AMER: 47 — AB
Glucose: 208 mg/dL — ABNORMAL HIGH (ref 65–99)
Osmolality: 288 (ref 275–301)
POTASSIUM: 3.8 mmol/L (ref 3.5–5.1)
Sodium: 139 mmol/L (ref 136–145)

## 2014-03-16 LAB — CBC WITH DIFFERENTIAL/PLATELET
Basophil #: 0 10*3/uL (ref 0.0–0.1)
Basophil %: 0.5 %
EOS ABS: 0 10*3/uL (ref 0.0–0.7)
EOS PCT: 0.3 %
HCT: 44.4 % (ref 40.0–52.0)
HGB: 14.3 g/dL (ref 13.0–18.0)
LYMPHS PCT: 6.4 %
Lymphocyte #: 0.5 10*3/uL — ABNORMAL LOW (ref 1.0–3.6)
MCH: 27.3 pg (ref 26.0–34.0)
MCHC: 32.2 g/dL (ref 32.0–36.0)
MCV: 85 fL (ref 80–100)
MONO ABS: 0.7 x10 3/mm (ref 0.2–1.0)
Monocyte %: 8.4 %
NEUTROS PCT: 84.4 %
Neutrophil #: 7.2 10*3/uL — ABNORMAL HIGH (ref 1.4–6.5)
Platelet: 194 10*3/uL (ref 150–440)
RBC: 5.24 10*6/uL (ref 4.40–5.90)
RDW: 14.1 % (ref 11.5–14.5)
WBC: 8.5 10*3/uL (ref 3.8–10.6)

## 2014-03-16 NOTE — Telephone Encounter (Signed)
OK to switch by me, suggest [facility]

## 2014-03-16 NOTE — Telephone Encounter (Signed)
Called and spoke to pt. Pt is currently in the hospital and stated his CPAP has broken and has tried to contact [facility] with no luck with help. Called and spoke to the Merchant navy officer at [facility] and he stated a tech saw pt's machine yesterday (03/15/14) and the machine worked fine and the humidifier was replaced and a [facility] tech will not be able to see pt till 03/17/14. Advised the pt to ask his nurse at [facility] to ask for loaner CPAP while in hospital. Pt verbalized understanding. Pt stated he is unhappy with [facility] and their services and wants to switch to another DME company. Pt also aware to call back here before 5pm if is to be discharged today or with any issues with not being able to get CPAP at [facility].   Dr. Lake Bells please advise on DME switch. Thanks.

## 2014-03-16 NOTE — Telephone Encounter (Signed)
Called and spoke to pt. Informed pt of the recs per BQ. Pt verbalized understanding. Informed pt he will need to talk to his nurse at [facility] to assure he has a CPAP tonight while in hospital. Pt verbalized understanding and is aware to call back tomorrow if issues arise.   Will route to [place] to f/u on.

## 2014-03-16 NOTE — Telephone Encounter (Signed)
Pt called back again wanting to know if we have heard anything about switching DME. Pt advised that we are still waiting on Dr Sherrin [name] to respond and we will contact hm once he does. Pt also made aware by Daneil Dan during initial conversation that the hospital staff would/should help to ensure this is taken care of before D/C.  BQ-please advise.

## 2014-03-25 ENCOUNTER — Ambulatory Visit: Payer: Self-pay

## 2014-03-30 ENCOUNTER — Ambulatory Visit: Payer: Self-pay | Admitting: Bariatrics

## 2014-04-18 ENCOUNTER — Other Ambulatory Visit: Payer: Self-pay | Admitting: Internal Medicine

## 2014-04-25 ENCOUNTER — Ambulatory Visit: Payer: Self-pay | Admitting: Bariatrics

## 2014-04-25 DIAGNOSIS — Z9884 Bariatric surgery status: Secondary | ICD-10-CM | POA: Insufficient documentation

## 2014-05-18 ENCOUNTER — Other Ambulatory Visit: Payer: Self-pay | Admitting: Internal Medicine

## 2014-05-18 NOTE — Telephone Encounter (Signed)
Approved:  #[id number] x 11

## 2014-05-18 NOTE — Telephone Encounter (Signed)
Cialis refill request.  Patient last seen 01/28/2014.  Rx last filled 09/27/2013.  Please advise.

## 2014-05-18 NOTE — Telephone Encounter (Signed)
Cialis called into [facility] per Dr Silvio Pate approval.

## 2014-06-16 ENCOUNTER — Other Ambulatory Visit: Payer: Self-pay | Admitting: Bariatrics

## 2014-06-16 LAB — COMPREHENSIVE METABOLIC PANEL
ALK PHOS: 98 U/L
ALT: 9 U/L — AB
AST: 14 U/L — AB
Albumin: 3.6 g/dL
Anion Gap: 7 (ref 7–16)
BUN: 13 mg/dL
Bilirubin,Total: 1.9 mg/dL — ABNORMAL HIGH
CALCIUM: 9.1 mg/dL
CO2: 30 mmol/L
Chloride: 104 mmol/L
Creatinine: 0.83 mg/dL
EGFR (African American): >60
EGFR (Non-African Amer.): >60
GLUCOSE: 139 mg/dL — AB
Potassium: 3.4 mmol/L — ABNORMAL LOW
Sodium: 141 mmol/L
Total Protein: 6.9 g/dL

## 2014-06-16 LAB — CBC WITH DIFFERENTIAL/PLATELET
Basophil #: 0.1 10*3/uL (ref 0.0–0.1)
Basophil %: 1.1 %
EOS PCT: 1.1 %
Eosinophil #: 0.1 10*3/uL (ref 0.0–0.7)
HCT: 40.9 % (ref 40.0–52.0)
HGB: 12.9 g/dL — ABNORMAL LOW (ref 13.0–18.0)
Lymphocyte #: 0.5 10*3/uL — ABNORMAL LOW (ref 1.0–3.6)
Lymphocyte %: 9.9 %
MCH: 26.3 pg (ref 26.0–34.0)
MCHC: 31.6 g/dL — ABNORMAL LOW (ref 32.0–36.0)
MCV: 83 fL (ref 80–100)
Monocyte #: 0.5 x10 3/mm (ref 0.2–1.0)
Monocyte %: 10.6 %
Neutrophil #: 3.9 10*3/uL (ref 1.4–6.5)
Neutrophil %: 77.3 %
Platelet: 148 10*3/uL — ABNORMAL LOW (ref 150–440)
RBC: 4.93 10*6/uL (ref 4.40–5.90)
RDW: 14.9 % — AB (ref 11.5–14.5)
WBC: 5 10*3/uL (ref 3.8–10.6)

## 2014-06-16 LAB — FOLATE: Folic Acid: 14.5 ng/mL (ref 3.1–17.5)

## 2014-06-16 LAB — FERRITIN: Ferritin (ARMC): 84 ng/mL (ref 8–388)

## 2014-06-16 LAB — AMYLASE: AMYLASE: 23 U/L — AB

## 2014-06-16 LAB — IRON: IRON: 46 ug/dL — AB (ref 65–175)

## 2014-06-16 LAB — PHOSPHORUS: Phosphorus: 3 mg/dL

## 2014-06-16 LAB — MAGNESIUM: MAGNESIUM: 1.7 mg/dL

## 2014-06-20 ENCOUNTER — Telehealth: Payer: Self-pay | Admitting: Cardiovascular Disease

## 2014-06-20 NOTE — Telephone Encounter (Signed)
Patient called to scheduled 6 month fu with [name].  Patient complains of elevated BP 257-505'X systolic .  Patient scheduled for 1st available 07-18-14 .  Please call patient as he may need to be worked in sooner.

## 2014-06-21 NOTE — Telephone Encounter (Signed)
LVM 3/29

## 2014-06-22 NOTE — Telephone Encounter (Signed)
Patient stated lately his blood pressure has been elevated consistently His SBP has been 150-170's He is taking all of his current meds as ordered  He denies and symptoms or daily changes

## 2014-06-23 NOTE — Telephone Encounter (Signed)
Increase Coreg to 6.25 mg bid and Lisinopril to 20 mg once daily. Keep follow up appointment in April.

## 2014-06-24 MED ORDER — CARVEDILOL 6.25 MG PO TABS: 6.2500 mg | ORAL_TABLET | Freq: Two times a day (BID) | ORAL | Status: DC

## 2014-06-24 MED ORDER — LISINOPRIL 20 MG PO TABS: 20.0000 mg | ORAL_TABLET | Freq: Every day | ORAL | Status: DC

## 2014-06-24 NOTE — Telephone Encounter (Signed)
Spoke w/ pt.  Advised him of Dr. Tyrell Antonio recommendation.  He verbalizes understanding and will call back w/ any questions or concerns.

## 2014-06-24 NOTE — Telephone Encounter (Signed)
Sent to [facility].

## 2014-06-24 NOTE — Telephone Encounter (Signed)
Pt does not have vm set up will try back later

## 2014-06-27 ENCOUNTER — Telehealth: Payer: Self-pay

## 2014-06-27 MED ORDER — CARVEDILOL 6.25 MG PO TABS: 6.2500 mg | ORAL_TABLET | Freq: Two times a day (BID) | ORAL | Status: DC

## 2014-06-27 NOTE — Telephone Encounter (Signed)
Needs Clarification on Coreg on quantity

## 2014-06-27 NOTE — Telephone Encounter (Signed)
I called and spoke with the [facility] outpatient pharmacy. They were checking on the quantity for the patient's carvedilol dose. This was increased to 6.25 mg BID on 3/31- refill was sent in for # 30 tablets. I authorized the pharmacy to dispense # 60 tablets.

## 2014-07-11 ENCOUNTER — Ambulatory Visit (INDEPENDENT_AMBULATORY_CARE_PROVIDER_SITE_OTHER): Payer: 59 | Admitting: Family Medicine

## 2014-07-11 ENCOUNTER — Encounter: Payer: Self-pay | Admitting: Family Medicine

## 2014-07-11 VITALS — BP 142/84 | HR 76 | Temp 98.1°F | Wt 279.0 lb

## 2014-07-11 DIAGNOSIS — D692 Other nonthrombocytopenic purpura: Secondary | ICD-10-CM

## 2014-07-11 DIAGNOSIS — Z309 Encounter for contraceptive management, unspecified: Secondary | ICD-10-CM

## 2014-07-11 DIAGNOSIS — Z3009 Encounter for other general counseling and advice on contraception: Secondary | ICD-10-CM

## 2014-07-11 MED ORDER — CLOTRIMAZOLE 1 % EX CREA: 1.0000 "application " | TOPICAL_CREAM | Freq: Two times a day (BID) | CUTANEOUS | Status: DC

## 2014-07-11 NOTE — Progress Notes (Signed)
Pre visit review using our clinic review tool, if applicable. No additional management support is needed unless otherwise documented below in the visit note.

## 2014-07-11 NOTE — Patient Instructions (Signed)
Blood work today. Trial lotrimin for rash. Let us know if not improving for possible referral to derm. We will refer you to urologist as well.

## 2014-07-11 NOTE — Progress Notes (Signed)
BP 142/84 mmHg  Pulse 76  Temp(Src) 98.1 F (36.7 C) (Oral)  Wt 279 lb (126.554 kg)   CC: check leg  Subjective:    Patient ID: [name], male    DOB: [birth year], 54 y.o.   MRN: [id number]  HPI: Arcangel Minion Theurer is a 54 y.o. male presenting on 07/11/2014 for Check leg   Wants 2 spots on L leg checked. Started small, enlarging. Started 1 week ago. Now erythematous borders with central clearing. Not tender or itchy.  No other skin rashes or lesions, no oral lesions, no fevers/chills, no nausea/vomiting or joint pains  On prograf and mycophenolate for h/o kidney transplant since 2012. Next appt June 2016.  Oh by the way - requests referral to urology to discuss vasectomy.  Relevant past medical, surgical, family and social history reviewed and updated as indicated. Interim medical history since our last visit reviewed. Allergies and medications reviewed and updated. Current Outpatient Prescriptions on File Prior to Visit  Medication Sig  . albuterol (PROAIR HFA) 108 (90 BASE) MCG/ACT inhaler Inhale 2 puffs into the lungs 4 (four) times daily as needed for wheezing or shortness of breath (cough).  Marland Kitchen aspirin 81 MG tablet Take 81 mg by mouth daily.  . beclomethasone (QVAR) 80 MCG/ACT inhaler Inhale 2 puffs into the lungs 2 (two) times daily.  . carvedilol (COREG) 6.25 MG tablet Take 1 tablet (6.25 mg total) by mouth 2 (two) times daily.  Marland Kitchen CIALIS 20 MG tablet Take 1/2 to 1 tablet every other day as needed for erectile dysfunction.  . colchicine 0.6 MG tablet Take 1 tablet (0.6 mg total) by mouth 2 (two) times daily as needed.  . furosemide (LASIX) 40 MG tablet Take 80 mg by mouth every other day.   . insulin glargine (LANTUS) 100 UNIT/ML injection Inject 8 Units into the skin at bedtime.   Marland Kitchen lisinopril (PRINIVIL,ZESTRIL) 20 MG tablet Take 1 tablet (20 mg total) by mouth daily.  . metolazone (ZAROXOLYN) 2.5 MG tablet Take 2.5 mg by mouth daily as needed (for excess fluid).   .  Multiple Vitamin (MULTIVITAMIN WITH MINERALS) TABS tablet Take 1 tablet by mouth daily.  . mycophenolate (MYFORTIC) 180 MG EC tablet Take 720 mg by mouth 2 (two) times daily.   Marland Kitchen NASONEX 50 MCG/ACT nasal spray USE 2 SPRAYS IN EACH NOSTRIL DAILY  . omeprazole (PRILOSEC) 20 MG capsule TAKE 1 CAPSULE BY MOUTH DAILY.  Marland Kitchen potassium chloride SA (K-DUR,KLOR-CON) 20 MEQ tablet Take 60 mEq by mouth 2 (two) times daily.   . tacrolimus (PROGRAF) 1 MG capsule Take 1 mg by mouth 2 (two) times daily.    No current facility-administered medications on file prior to visit.    Review of Systems Per HPI unless specifically indicated above     Objective:    BP 142/84 mmHg  Pulse 76  Temp(Src) 98.1 F (36.7 C) (Oral)  Wt 279 lb (126.554 kg)  Wt Readings from Last 3 Encounters:  07/11/14 279 lb (126.554 kg)  01/28/14 319 lb (144.697 kg)  01/17/14 318 lb 8 oz (144.471 kg)    Physical Exam  Constitutional: He appears well-developed and well-nourished. No distress.  Skin: Skin is warm and dry. Rash noted. There is erythema.  nonblanching crescent shaped purpuric rash x2 on R lower leg with central clearing  Nursing note and vitals reviewed.  Results for orders placed or performed in visit on 02/21/14  Hemoglobin A1c  Result Value Ref Range   Hemoglobin A1C  8.6    Lab Results  Component Value Date   HGB 17.7* 08/17/2013   HCT 52.0 08/17/2013       Assessment & Plan:   Problem List Items Addressed This Visit    Purpura - Primary    ?purpuric rash vs atypical tinea corporis. Will check CBC and trial lotrimin. Update if spreading or worsening to consider referral to derm. Pt agrees with plan.  Reviewing side effects of tacrolimus and cellcept, thrombocytopenia is listed as is purpura.      Relevant Orders   CBC with Differential/Platelet    Other Visit Diagnoses    Vasectomy evaluation        Relevant Orders    Ambulatory referral to Urology        Follow up plan: Return if  symptoms worsen or fail to improve.

## 2014-07-11 NOTE — Assessment & Plan Note (Signed)
?  purpuric rash vs atypical tinea corporis. Will check CBC and trial lotrimin. Update if spreading or worsening to consider referral to derm. Pt agrees with plan.  Reviewing side effects of tacrolimus and cellcept, thrombocytopenia is listed as is purpura.

## 2014-07-12 LAB — CBC WITH DIFFERENTIAL/PLATELET
BASOS PCT: 0 % (ref 0.0–3.0)
Basophils Absolute: 0 10*3/uL (ref 0.0–0.1)
EOS PCT: 1.3 % (ref 0.0–5.0)
Eosinophils Absolute: 0.1 10*3/uL (ref 0.0–0.7)
HEMATOCRIT: 40.8 % (ref 39.0–52.0)
HEMOGLOBIN: 13.7 g/dL (ref 13.0–17.0)
Lymphocytes Relative: 9.8 % — ABNORMAL LOW (ref 12.0–46.0)
Lymphs Abs: 0.6 10*3/uL — ABNORMAL LOW (ref 0.7–4.0)
MCHC: 33.5 g/dL (ref 30.0–36.0)
MCV: 80.7 fl (ref 78.0–100.0)
Monocytes Absolute: 0.6 10*3/uL (ref 0.1–1.0)
Monocytes Relative: 9.4 % (ref 3.0–12.0)
Neutro Abs: 4.8 10*3/uL (ref 1.4–7.7)
Neutrophils Relative %: 79.5 % — ABNORMAL HIGH (ref 43.0–77.0)
Platelets: 175 10*3/uL (ref 150.0–400.0)
RBC: 5.06 Mil/uL (ref 4.22–5.81)
RDW: 15.8 % — ABNORMAL HIGH (ref 11.5–15.5)
WBC: 6 10*3/uL (ref 4.0–10.5)

## 2014-07-13 ENCOUNTER — Telehealth: Payer: Self-pay

## 2014-07-13 DIAGNOSIS — D692 Other nonthrombocytopenic purpura: Secondary | ICD-10-CM

## 2014-07-13 NOTE — Telephone Encounter (Signed)
Pt request recent lab results and pt advised per mychart note as instructed. Pt voiced understanding. Pt seen on 07/11/14; rash on lower leg is larger by 5-10 %; no itching. [facility] pharmacy. Pt request cb.

## 2014-07-13 NOTE — Telephone Encounter (Signed)
Is it spreading outward? Has he been using lotrimin? If seems to be spreading, would offer derm eval vs have him schedule check with transplant docs for their opinion. If not spreading, would continue to watch for now.

## 2014-07-14 NOTE — Telephone Encounter (Signed)
Referral placed. If rash resolves, recommend he cancel derm appt. Otherwise may schedule f/u with derm.

## 2014-07-14 NOTE — Telephone Encounter (Signed)
Spoke with patient. He said the rash is spreading, but it isn't as red as it was. He has been using the lotrimin as directed. He would like to go ahead with derm referral.

## 2014-07-14 NOTE — Telephone Encounter (Signed)
No answer and no VM available.

## 2014-07-15 NOTE — Discharge Summary (Signed)
PATIENT NAME:  [name], [name] MR#:  [id number] DATE OF BIRTH:  Aug 23, 1960  DATE OF ADMISSION:  05/09/2012 DATE OF DISCHARGE:  05/10/2012  For a detailed note, please take a look at the history and physical done on admission.  DISCHARGE DIAGNOSES: 1. Chest pain, likely musculoskeletal in nature, related to acute bronchitis. 2. Acute bronchitis.  3. History of renal transplant.  4. Diabetes.  5. Hypokalemia.  6. History of congestive heart failure.  7. Gastroesophageal reflux disease.   DIET: The patient is being discharged on a low-sodium, low-fat, American Diabetic Association diet.   ACTIVITY: As tolerated.   DISCHARGE FOLLOWUP: With Dr. [name] in the next 1 to 2 weeks. Also with Dr. Silvio Pate in the next 1 to 2 weeks.   DISCHARGE MEDICATIONS: 1. Labetalol 300 mg b.i.d.  2. Lantus 120 units at bedtime. 3. NovoLog 20 units t.i.d. before meals.  4. Tacrolimus 1 mg 1 tab b.i.d. 5. Myfortic 180 mg 4 tabs b.i.d.  6. Valganciclovir 450 mg 1 tab Monday, Wednesday and Friday. 7. Bactrim single-strength 1 tab on Monday, Wednesday and Friday. 8. Omeprazole 20 mg daily. 9. Bupropion 150 mg daily. 10. Lasix 80 mg b.i.d.  11. Glipizide 5 mg 2 tabs b.i.d.  12. Metolazone 2.5 mg weekly. 13. Nasonex 2 sprays to each nostril daily. 14. Aspirin 81 mg daily. 15. Levaquin 500 mg daily x 7 days. 16. Albuterol inhaler 2 puffs every 4 hours as needed for shortness of breath.   NOTE: The patient is to stop taking his Augmentin as mentioned.   [facility] [place] [facility] COURSE: Dr. [name] from cardiology.   PERTINENT STUDIES DONE DURING THE HOSPITAL COURSE: Chest x-ray done on admission showing shallow inspiration, findings which may represent a component of pulmonary vascular congestion versus mild edema.   HOSPITAL COURSE: This is a 54 year old male with medical problems, as mentioned above, who presented to the hospital with chest pain.  1. Chest pain. The patient does have  significant risk factors for CAD given his diabetes and history of bypass surgery. He was therefore observed overnight on telemetry, had 3 sets of cardiac markers checked, which were negative. He remained chest pain-free during the hospital course. He was seen in consultation by cardiology, with [name] who thought this could probably be related to musculoskeletal/related to acute bronchitis, although the patient would benefit from a stress test, which is going to be arranged for him as an outpatient, at Dr. Tyrell Antonio office.  2. Hypokalemia. This is secondary from him being on high dose diuretics. The patient was given some potassium supplements. His potassium has improved. He will continue his potassium supplements as stated.  3. Hypomagnesemia. Also probably related to high dose diuretic use. He is already on p.o. magnesium supplement, which he will resume upon discharge.  4. Diabetes. The patient had no evidence of any acute hypoglycemic episodes. He will continue his Lantus and sliding scale insulin coverage, as stated.  5. Acute bronchitis. The patient still continued to have a cough and some mild hypoxemia on ambulation. I switched him from p.o. Augmentin to Levaquin, as stated, and also gave him albuterol inhaler and told him to continue some Robitussin as needed for his bronchitis. This should hopefully improve within the next week to 10 days.  6. History of renal transplant. The patient is already on immunosuppressants including tacrolimus and Myfortic. He will continue that along with his valganciclovir and his Bactrim single strength, as stated.  7. History of congestive heart failure.  The patient had no evidence of congestive heart failure while in the hospital. He will continue his metolazone and Lasix, as stated.  8. GERD. The patient was maintained on his omeprazole and he will resume that upon discharge.   CODE STATUS: The patient is a FULL CODE.   TIME SPENT: 35 minutes.   ____________________________ Belia Heman. Verdell Carmine, MD vjs:sb D: 05/10/2012 14:40:18 ET T: 05/11/2012 09:11:32 ET JOB#: [id number]  cc: Belia Heman. Verdell Carmine, MD, <Dictator> Muhammad A. Fletcher Anon, MD [name], MD Henreitta Leber MD ELECTRONICALLY SIGNED 05/22/2012 1:00

## 2014-07-15 NOTE — H&P (Signed)
PATIENT NAME:  [name], [name] MR#:  [id number] DATE OF BIRTH:  [birth year]  DATE OF ADMISSION:  05/09/2012  PRIMARY CARE PHYSICIAN: [name], MD  CHIEF COMPLAINT: Chest pain.   HISTORY OF PRESENT ILLNESS: This is a 54 year old male who presented to the hospital with chest pain that began the past couple days. The patient says that he was at work yesterday and started developing this tightness in his chest. He went home. He took some aspirin, which did not alleviate his symptoms. He also suffered with the pain pretty much throughout the night, and therefore, around 3:00, he got a bit concerned and therefore came to the ER for further evaluation. The patient still continues to have some mild chest pain/tightness. He says that he has had a recent acute bronchitis and has been treated with antibiotics and has been having a cough that has been nonproductive over the past few days. He denies any shortness of breath with these chest pains. No nausea, no vomiting, no diaphoresis, no palpitations, no syncope. The patient was brought to the ER, given some more aspirin, given some nitro, which still has not alleviated symptoms. Since he continues to have chest pain and has a history of diabetes and bypass surgery, hospitalist services were contacted for treatment and evaluation.   REVIEW OF SYSTEMS:   CONSTITUTIONAL: No documented fever. No weight gain, no weight loss.  EYES: No blurred or double vision.  ENT: No tinnitus. No postnasal drip. No redness of the oropharynx.  RESPIRATORY: Positive cough. No wheeze, hemoptysis. Positive dyspnea.  CARDIOVASCULAR: Positive chest pain. No orthopnea, no palpitations, no syncope.  GASTROINTESTINAL: No nausea, no vomiting, no diarrhea, no abdominal pain, no melena, no hematochezia.  GENITOURINARY: No dysuria, no hematuria.  ENDOCRINE: No polyuria or nocturia. No heat or cold intolerance.  HEMATOLOGIC: No anemia, no bruising, no bleeding.  INTEGUMENT: No rashes.  No lesions.  MUSCULOSKELETAL: No arthritis. No swelling. No gout.  NEUROLOGIC: No numbness or tingling. No ataxia. No seizure-type activity.  PSYCHIATRIC: No anxiety, no insomnia, no ADD.   PAST MEDICAL HISTORY: Consistent with diabetes, hypertension, history of renal failure, status post renal transplant, GERD, history of coronary artery disease, status post CABG, history of recent bronchitis.   ALLERGIES: TO MORPHINE, WHICH CAUSES ITCHING.   SOCIAL HISTORY: No smoking. No alcohol abuse. No illicit drug abuse. Lives at home with his wife.   FAMILY HISTORY: Mother is alive and healthy. Father had a kidney transplant. He cannot recall what he died from.   CURRENT MEDICATIONS: As follows: Amoxicillin 875 mg daily for 3 more days, aspirin 81 mg daily, Bactrim double strength 1 tab daily, Lasix 80 mg b.i.d., glipizide 5 mg daily, labetalol 300 mg b.i.d., metolazone 2.5 mg daily, Myfortic 180 mg b.i.d., Nasonex 50 mcg spray 2 times b.i.d., omeprazole 20 mg daily, tacrolimus 1 mg b.i.d., valganciclovir 450 mg daily on Monday, Wednesday, Friday, bupropion 150 mg daily, Lantus 35 units at bedtime and potassium 60 mEq daily.   PHYSICAL EXAMINATION ON ADMISSION: As follows:  VITAL SIGNS: Noted to be temperature 97.8, pulse 72, respirations 20, blood pressure 137/66, sat is 97% on room air.  GENERAL: He is a pleasant-appearing male in no apparent distress.  HEAD, EYES, EARS, NOSE AND THROAT: Atraumatic, normocephalic. Extraocular muscles are intact. Pupils equal and reactive to light. Sclerae anicteric. No conjunctival injection. No pharyngeal erythema.  NECK: Supple. There is no jugular venous distention. No bruits. No lymphadenopathy. No thyromegaly.  HEART: Regular rate and rhythm. No  murmurs, rubs or clicks.  LUNGS: Clear to auscultation bilaterally. No rales or rhonchi, no wheezes.  ABDOMEN: Soft, flat, nontender, nondistended. Has good bowel sounds. No hepatosplenomegaly appreciated.  EXTREMITIES:  No evidence of any cyanosis, clubbing or peripheral edema. Has +2 pedal and radial pulses bilaterally.  NEUROLOGICAL: The patient is alert, awake and oriented x3, with no focal motor or sensory deficits appreciated bilaterally.  SKIN: Moist and warm, with no rashes appreciated.  LYMPHATIC: There is no cervical or axillary lymphadenopathy.   LABORATORY EXAMINATION: Shows serum glucose of 129, BUN 17, creatinine 1.2, sodium 138, potassium 2.8, chloride 98, bicarbonate 37. LFTs within normal limits. Troponin 0.04. White cell count 6.5, hemoglobin 14.4, hematocrit 44.4, platelet count 177. INR is 1.0.   The patient did have a chest x-ray done, which showed shallow inspiration, but no other acute abnormality. EKG showed normal sinus rhythm with left axis deviation and LVH by voltage criteria, but no other acute ST or T wave changes.   ASSESSMENT AND PLAN: This is a 54 year old male with a history of diabetes, gastroesophageal reflux disease, history of renal transplant, hypertension, recent bronchitis, coronary artery disease, status post coronary artery bypass grafting in 2004, who presents to the hospital with chest pain.   1. Chest pain. The patient does have significant risk factors given history of coronary artery disease status post CABG and diabetes. I will observe him overnight on telemetry. Continue aspirin, continue some nitroglycerin. Will cycle his cardiac markers, observe him on telemetry. Will get a cardiology consult. The patient as requested the [facility] group. I discussed the case with [name], who will see the patient. I suspect most likely the cause of the chest pain is probably bronchitis which he is recently suffering from, and his cough is probably reproducing the chest pain at this point.  2. Acute bronchitis. I will continue amoxicillin for now. Will add some Tussionex for cough.  3. Gastroesophageal reflux disease. Continue omeprazole.  4. Diabetes. Continue Lantus sliding scale  insulin and glipizide.  5. History of renal transplant. Continue tacrolimus and Myfortic.   6. Hypertension. Continue labetalol. 7. Hypokalemia, likely the result of him being on high-dose diuretics. I will replace his potassium accordingly and repeat it in the morning.   CODE STATUS: The patient is a full code.   TIME SPENT: 50 minutes.    ____________________________ Belia Heman. Verdell Carmine, MD vjs:OSi D: 05/09/2012 10:39:17 ET T: 05/09/2012 11:53:23 ET JOB#: [id number]  cc: Belia Heman. Verdell Carmine, MD, <Dictator> Henreitta Leber MD ELECTRONICALLY SIGNED 05/10/2012 20:55

## 2014-07-15 NOTE — Consult Note (Signed)
General Aspect This is a 54 year old male who presented to the hospital with chest pain that began the past couple days but worsend last night. He went to a resturant in evening and felt claustrophobic and anxious. He had chest pain and did not feel well.  He went home. He took some aspirin, which did not alleviate his symptoms. He also suffered with the pain pretty much throughout the night, and therefore, around 3:00, he got a bit concerned and therefore came to the ER for further evaluation. The patient still continues to have some mild chest pain/tightness. He says that he has had a recent acute bronchitis and has been treated with antibiotics and has been having a cough that has been nonproductive over the past few days. He denies any shortness of breath with these chest pains. No nausea, no vomiting, no diaphoresis, no palpitations, no syncope. The patient was brought to the ER, given some more aspirin, given some nitro, which still has not alleviated symptoms.  He has known history of CAD s/p CABG in 2004, type 2 diabetes, ESRD previously on HD from 2004-2012 when he got a kidney transplant. He had ischemic cardiac evaluation in 2012 before kidney transplant at [facility] and was told everything was OK. He is not sure if he had a cath at that time.   Physical Exam:  GEN no acute distress   NECK supple   RESP normal resp effort  clear BS   CARD Regular rate and rhythm  No murmur   ABD denies tenderness   EXTR negative cyanosis/clubbing, negative edema   SKIN normal to palpation   PSYCH alert, A+O to time, place, person   Review of Systems:  Subjective/Chief Complaint chest pain   General: Fatigue   Skin: No Complaints   ENT: No Complaints   Eyes: No Complaints   Neck: No Complaints   Respiratory: Frequent cough  Short of breath  Sputum   Cardiovascular: Chest pain or discomfort   Gastrointestinal: No Complaints   Genitourinary: No Complaints   Vascular: No Complaints    Musculoskeletal: No Complaints   Neurologic: No Complaints   Hematologic: No Complaints   Endocrine: No Complaints   Lab Results: Hepatic:  15-Feb-14 04:50   Bilirubin, Total -  Alkaline Phosphatase -  SGPT (ALT) -  SGOT (AST) -  Total Protein, Serum -  Albumin, Serum -    08:03   Bilirubin, Total 0.4  Alkaline Phosphatase 80  SGPT (ALT) 28  SGOT (AST) 32  Total Protein, Serum 6.7  Albumin, Serum  3.3  Cardiology:  15-Feb-14 04:07   Ventricular Rate 71  Atrial Rate 71  P-R Interval 236  QRS Duration 164  QT 474  QTc 515  P Axis 70  R Axis -62  T Axis 59  ECG interpretation Sinus rhythm with 1st degree A-V block Left axis deviation Non-specific intra-ventricular conduction block Abnormal ECG When compared with ECG of 07-Mar-2010 08:16, PR interval has increased QRS duration has increased ----------unconfirmed---------- Confirmed by OVERREAD, NOT ([id number]), editor PEARSON, BARBARA ([id number]) on 05/09/2012 9:06:16 AM  Routine Chem:  15-Feb-14 04:50   B-Type Natriuretic Peptide ([facility]) - (Result(s) reported on 09 May 2012 at 06:04AM.)  Glucose, Serum  2  BUN -  Creatinine (comp) -  Sodium, Serum -  Potassium, Serum -  Chloride, Serum -  CO2, Serum -  Calcium (Total), Serum -  Osmolality (calc) -  eGFR (African American) -  eGFR (Non-African American) - (eGFR values <58m/min/1.73 m2 may  be an indication of chronic kidney disease (CKD). Calculated eGFR is useful in patients with stable renal function. The eGFR calculation will not be reliable in acutely ill patients when serum creatinine is changing rapidly. It is not useful in  patients on dialysis. The eGFR calculation may not be applicable to patients at the low and high extremes of body sizes, pregnant women, and vegetarians.)  Anion Gap -  Result Comment all tests - cancelled. sample shows hemolysis.  Result(s) reported on 09 May 2012 at 06:00AM.    08:03   B-Type Natriuretic Peptide [facility])  724 (Result(s)  reported on 09 May 2012 at 08:43AM.)  Glucose, Serum  129  BUN 17  Creatinine (comp) 1.20  Sodium, Serum 138  Potassium, Serum  2.8  Chloride, Serum 98  CO2, Serum  37  Calcium (Total), Serum 8.5  Osmolality (calc) 279  eGFR (African American) >60  eGFR (Non-African American) >60 (eGFR values <45m/min/1.73 m2 may be an indication of chronic kidney disease (CKD). Calculated eGFR is useful in patients with stable renal function. The eGFR calculation will not be reliable in acutely ill patients when serum creatinine is changing rapidly. It is not useful in  patients on dialysis. The eGFR calculation may not be applicable to patients at the low and high extremes of body sizes, pregnant women, and vegetarians.)  Anion Gap  3  Cardiac:  15-Feb-14 04:50   Troponin I - (0.00-0.05 0.05 ng/mL or less: NEGATIVE  Repeat testing in 3-6 hrs  if clinically indicated. >0.05 ng/mL: POTENTIAL  MYOCARDIAL INJURY. Repeat  testing in 3-6 hrs if  clinically indicated. NOTE: An increase or decrease  of 30% or more on serial  testing suggests a  clinically important change)  CK, Total -  CPK-MB, Serum -    08:03   Troponin I 0.04 (0.00-0.05 0.05 ng/mL or less: NEGATIVE  Repeat testing in 3-6 hrs  if clinically indicated. >0.05 ng/mL: POTENTIAL  MYOCARDIAL INJURY. Repeat  testing in 3-6 hrs if  clinically indicated. NOTE: An increase or decrease  of 30% or more on serial  testing suggests a  clinically important change)  CK, Total 201  CPK-MB, Serum 3.2 (Result(s) reported on 09 May 2012 at 08:42AM.)  Routine Coag:  15-Feb-14 04:50   Activated PTT (APTT) - (A HCT value >55% may artifactually increase the APTT. In one study, the increase was an average of 19%. Reference: "Effect on Routine and Special Coagulation Testing Values of Citrate Anticoagulant Adjustment in Patients with High HCT Values." American Journal of Clinical Pathology 2006;126:400-405.)  Prothrombin -  INR - (INR  reference interval applies to patients on anticoagulant therapy. A single INR therapeutic range for coumarins is not optimal for all indications; however, the suggested range for most indications is 2.0 - 3.0. Exceptions to the INR Reference Range may include: Prosthetic heart valves, acute myocardial infarction, prevention of myocardial infarction, and combinations of aspirin and anticoagulant. The need for a higher or lower target INR must be assessed individually. Reference: The Pharmacology and Management of the Vitamin K  antagonists: the seventh ACCP Conference on Antithrombotic and Thrombolytic Therapy. CZVJKQ.2060Sept:126 (3suppl): 2N9146842 A HCT value >55% may artifactually increase the PT.  In one study,  the increase was an average of 25%. Reference:  "Effect on Routine and Special Coagulation Testing Values of Citrate Anticoagulant Adjustment in Patients with High HCT Values." American Journal of Clinical Pathology 2006;126:400-405.)    08:03   Activated PTT (APTT) 31.0 (A HCT value >55%  may artifactually increase the APTT. In one study, the increase was an average of 19%. Reference: "Effect on Routine and Special Coagulation Testing Values of Citrate Anticoagulant Adjustment in Patients with High HCT Values." American Journal of Clinical Pathology 2006;126:400-405.)  Prothrombin 14.0  INR 1.0 (INR reference interval applies to patients on anticoagulant therapy. A single INR therapeutic range for coumarins is not optimal for all indications; however, the suggested range for most indications is 2.0 - 3.0. Exceptions to the INR Reference Range may include: Prosthetic heart valves, acute myocardial infarction, prevention of myocardial infarction, and combinations of aspirin and anticoagulant. The need for a higher or lower target INR must be assessed individually. Reference: The Pharmacology and Management of the Vitamin K  antagonists: the seventh ACCP Conference on  Antithrombotic and Thrombolytic Therapy. MBPJP.2162 Sept:126 (3suppl): [id number]. A HCT value >55% may artifactually increase the PT.  In one study,  the increase was an average of 25%. Reference:  "Effect on Routine and Special Coagulation Testing Values of Citrate Anticoagulant Adjustment in Patients with High HCT Values." American Journal of Clinical Pathology 2006;126:400-405.)  Routine Hem:  15-Feb-14 04:50   WBC (CBC) 6.5  RBC (CBC)  6.07  Hemoglobin (CBC) 14.4  Hematocrit (CBC) 44.4  Platelet Count (CBC) 177 (Result(s) reported on 09 May 2012 at 05:29AM.)  MCV  73  MCH  23.8  MCHC 32.5  RDW  17.0   EKG:  EKG NSR   Interpretation IVCD    Morphine: Itching  Vital Signs/Nurse's Notes: **Vital Signs.:   15-Feb-14 12:00  Vital Signs Type Admission  Pulse Pulse 67  Respirations Respirations 18  Systolic BP Systolic BP 446  Diastolic BP (mmHg) Diastolic BP (mmHg) 75  Mean BP 95  Pulse Ox % Pulse Ox % 93  Pulse Ox Activity Level  At rest  Oxygen Delivery 2L; Nasal Cannula    Impression 1. Atypical chest pain.  2. Known CAD s/p CABG in 2004 with multiple risk factors for CAD.  3. S/P Kidney transplant which seem to be working well.  4. Hypertension. controlled.   Plan The chest pain is overall atypical. Continue medical therapy. Cycle cardiac enzymes. If remains negative without exertional chest pain, can discharge home tomorrow with outpatient Nuclear stress test.   Electronic Signatures: [name] (MD)  (Signed 15-Feb-14 12:42)  Authored: General Aspect/Present Illness, History and Physical Exam, Review of System, Labs, EKG , Allergies, Vital Signs/Nurse's Notes, Impression/Plan   Last Updated: 15-Feb-14 12:42 by [name] (MD)

## 2014-07-16 NOTE — Op Note (Signed)
PATIENT NAME:  [name], OTTAWAY MR#:  [id number] DATE OF BIRTH:  10/13/1960  DATE OF PROCEDURE:  03/15/2014  PROCEDURE PERFORMED: Laparoscopic sleeve gastrectomy with cholecystectomy.   PREOPERATIVE DIAGNOSIS: Long-standing morbid obesity with multiple comorbidities including diabetes mellitus, hypertension, obstructive sleep apnea, presence of cholelithiasis noted by preoperative ultrasound.  POSTOPERATIVE DIAGNOSIS:   Long-standing morbid obesity with multiple comorbidities including diabetes mellitus, hypertension, obstructive sleep apnea, presence of cholelithiasis noted by preoperative ultrasound with evidence of chronic cholecystitis with cholelithiasis.   PHYSICIAN IN ATTENDANCE: Legrand Como A. Duke Salvia, M.D.   ASSISTANT:  Fleeta Emmer. Drinkwater, PA-C.    DESCRIPTION OF PROCEDURE: The patient was brought to the Operating Room and placed in the supine position. General anesthesia was obtained with orotracheal intubation. TED hose and Thromboguards were applied and a foot board applied at the end of the operative bed. The chest and abdomen were sterilely prepped and draped. A 5 mm Optiview trocar was introduced under direct visualization in the left upper quadrant of the abdomen. Pneumoperitoneum was obtained with carbon dioxide. Three additional trocars introduced across the upper abdomen and a Nathanson liver retractor introduced through a subxiphoid defect. On gross inspection, there was no evidence of a hiatal hernia. A fairly prominent fat pad was released from the area of the angle of His and the upper stomach freed from the undersurface of the left hemidiaphragm. Again, no presence of a hiatal hernia appreciated. The pylorus was identified and beginning approximately 4 cm proximal to this, there was division of vascular pedicles along the greater curvature of the stomach with the ablation directed superiorly over a distance of approximately 8 cm.  Posterior attachments of the stomach to the pancreas then  released by use of the Harmonic scalpel.   Next, a series of GIA staple firings were placed to create a medially based gastric tube effect. The first firing was a black load staple placed in a relative transverse direction as well as the next gold load stapler. Efforts were made to avoid narrowing in the region of the incisura.   Next, a 42 French ViSiGi device was deployed into the area of the stapled antrum. Additional gold load stapler was then used to create an additional line of staples directed parallel to the lesser curvature of the stomach and as this was done, the ViSiGi device was gradually withdrawn to create a consistent tubular effect. The staple line ultimately brought out just lateral to the angle of His leaving a small dog ear of stomach present. The stomach was occluded distally and with the ViSiGi device deployed in the deeper stomach, insufflation performed. There was no air leak noted with a saline bath. The lateral stomach freed from the residual gastrosplenic and gastrocolic ligament, then retrieved through the right upper abdominal 12 mm trocar site.   The gallbladder was then addressed. Peritoneal attachments to the duodenum and surrounding periduodenal fatty tissue taken down by use of the Harmonic scalpel. The thickened peritoneum at the base of the gallbladder mobilized by use of the Harmonic scalpel and blunt dissection with eventual isolation of the cystic duct. Several small branches of the cystic artery were divided along the base of the gallbladder. A retrograde dissection of the gallbladder was then performed freeing it from the liver bed with excellent hemostasis. The cystic duct isolated circumferentially near its junction with the common bile duct where it was then ligated with 0 PDS Endoloop. The cystic duct was transected and distal to this. The gallbladder was brought out through the  12 mm trocar site after aspiration of its contents and retrieval of multiple stones. The  fascia and peritoneum of the defect were then closed with 0 Vicryl suture under direct visualization. The wounds injected with 0.25% Marcaine followed by 4-0 Monocryl in the dermis followed by Dermabond. The patient was allowed to recover, having tolerated the procedure well.     ____________________________ [name]. Duke Salvia, MD mat:by D: 03/15/2014 19:46:24 ET T: 03/15/2014 21:10:04 ET JOB#: [id number]  cc: Legrand Como A. Duke Salvia, MD, <Dictator> Ladora Daniel MD ELECTRONICALLY SIGNED 03/24/2014 11:08

## 2014-07-18 ENCOUNTER — Encounter: Payer: Self-pay | Admitting: Cardiovascular Disease

## 2014-07-18 ENCOUNTER — Ambulatory Visit (INDEPENDENT_AMBULATORY_CARE_PROVIDER_SITE_OTHER): Payer: 59 | Admitting: Cardiovascular Disease

## 2014-07-18 VITALS — BP 150/88 | HR 43 | Ht 71.0 in | Wt 280.8 lb

## 2014-07-18 DIAGNOSIS — I2510 Atherosclerotic heart disease of native coronary artery without angina pectoris: Secondary | ICD-10-CM | POA: Diagnosis not present

## 2014-07-18 DIAGNOSIS — I10 Essential (primary) hypertension: Secondary | ICD-10-CM | POA: Diagnosis not present

## 2014-07-18 DIAGNOSIS — I4891 Unspecified atrial fibrillation: Secondary | ICD-10-CM

## 2014-07-18 DIAGNOSIS — I5022 Chronic systolic (congestive) heart failure: Secondary | ICD-10-CM

## 2014-07-18 DIAGNOSIS — I482 Chronic atrial fibrillation, unspecified: Secondary | ICD-10-CM | POA: Insufficient documentation

## 2014-07-18 DIAGNOSIS — I480 Paroxysmal atrial fibrillation: Secondary | ICD-10-CM | POA: Insufficient documentation

## 2014-07-18 LAB — SURGICAL PATHOLOGY

## 2014-07-18 MED ORDER — LISINOPRIL 40 MG PO TABS: 40.0000 mg | ORAL_TABLET | Freq: Every day | ORAL | Status: DC

## 2014-07-18 MED ORDER — CARVEDILOL 3.125 MG PO TABS: 3.1250 mg | ORAL_TABLET | Freq: Two times a day (BID) | ORAL | Status: DC

## 2014-07-18 MED ORDER — APIXABAN 5 MG PO TABS: 5.0000 mg | ORAL_TABLET | Freq: Two times a day (BID) | ORAL | Status: DC

## 2014-07-18 NOTE — Progress Notes (Signed)
HPI  This is a pleasant 54 year-old male who is here today for a follow up visit. He has known history of coronary artery disease status post CABG in 2004 with no recurrent ischemic events since then, chronic systolic heart failure with ejection fraction of 35-40 %, type 2 diabetes, end-stage renal disease previously on hemodialysis from 2004 - 2012 when he got a kidney transplant, hypertension, hyperlipidemia and sleep apnea. He was evaluated by me in February of 2014 when he was admitted to [facility] with atypical chest pain in the setting of bronchitis. He underwent an outpatient nuclear stress test which showed no evidence of ischemia. However, the ejection fraction was reported to be 31%.  Echocardiogram in February of 2015 showed an ejection fraction of 35-40% with grade 2 diastolic dysfunction and significantly dilated left atrium.  She underwent gastric bypass in September 5701 without complications. He lost 50 pounds since then. He denies any chest pain or shortness of breath. He is noted to be in atrial fibrillation today with slow ventricular rate. This is a new diagnosis for him. No previous stroke. He denies dizziness.  Allergies  Allergen Reactions  . Morphine     MORPHINE, puritis     Current Outpatient Prescriptions on File Prior to Visit  Medication Sig Dispense Refill  . albuterol (PROAIR HFA) 108 (90 BASE) MCG/ACT inhaler Inhale 2 puffs into the lungs 4 (four) times daily as needed for wheezing or shortness of breath (cough). 1 Inhaler 3  . beclomethasone (QVAR) 80 MCG/ACT inhaler Inhale 2 puffs into the lungs 2 (two) times daily. 3 Inhaler 3  . buPROPion (WELLBUTRIN) 75 MG tablet Take 75 mg by mouth 2 (two) times daily.    Marland Kitchen CIALIS 20 MG tablet Take 1/2 to 1 tablet every other day as needed for erectile dysfunction. 6 tablet 11  . clotrimazole (LOTRIMIN AF) 1 % cream Apply 1 application topically 2 (two) times daily. 30 g 0  . colchicine 0.6 MG tablet Take 1 tablet (0.6 mg  total) by mouth 2 (two) times daily as needed. 60 tablet 2  . furosemide (LASIX) 40 MG tablet Take 80 mg by mouth every other day.     . insulin glargine (LANTUS) 100 UNIT/ML injection Inject 8 Units into the skin at bedtime.     . metolazone (ZAROXOLYN) 2.5 MG tablet Take 2.5 mg by mouth daily as needed (for excess fluid).     . Multiple Vitamin (MULTIVITAMIN WITH MINERALS) TABS tablet Take 1 tablet by mouth daily.    . mycophenolate (MYFORTIC) 180 MG EC tablet Take 720 mg by mouth 2 (two) times daily.     Marland Kitchen NASONEX 50 MCG/ACT nasal spray USE 2 SPRAYS IN EACH NOSTRIL DAILY 17 g 11  . omeprazole (PRILOSEC) 20 MG capsule TAKE 1 CAPSULE BY MOUTH DAILY. 30 capsule 5  . potassium chloride SA (K-DUR,KLOR-CON) 20 MEQ tablet Take 60 mEq by mouth 2 (two) times daily.     . tacrolimus (PROGRAF) 1 MG capsule Take 1 mg by mouth 2 (two) times daily.      No current facility-administered medications on file prior to visit.     Past Medical History  Diagnosis Date  . Hypertension   . Hyperlipidemia     low since renal failure  . Kidney transplant status, cadaveric 2012  . Depression   . GERD (gastroesophageal reflux disease)   . Diabetes mellitus   . Gout   . Asthma   . ESRD (end stage renal disease)  2005    Dialysis then renal transplant  . CAD (coronary artery disease) 2005    had CABG  . CHF (congestive heart failure)   . Pneumonia   . Wears glasses   . Sleep apnea     bipap with oxygen 2 l     Past Surgical History  Procedure Laterality Date  . Kidney transplant  09/13/2010    cadaver--at [facility]  . Tympanic membrane repair  1/12    left  . Dg angio av shunt*l*      right and left upper arms  . Fasciotomy  03/03/2012    Procedure: FASCIOTOMY;  Surgeon: [name], MD;  Location: [facility];  Service: Orthopedics;  Laterality: Right;  FASCIOTOMY RIGHT SMALL FINGER  . Kidney transplant    . Cardiac catheterization      [facility]  . Coronary artery bypass graft  2005      X 4  . Colonoscopy with propofol N/A 04/22/2013    Procedure: COLONOSCOPY WITH PROPOFOL;  Surgeon: [name], MD;  Location: [facility];  Service: Endoscopy;  Laterality: N/A;  . Fasciotomy Left 08/17/2013    Procedure: FASCIOTOMY LEFT RING;  Surgeon: [name], MD;  Location: [facility];  Service: Orthopedics;  Laterality: Left;  . Bariatric surgery       Family History  Problem Relation Age of Onset  . Heart disease Father   . Diabetes Maternal Grandmother   . Breast cancer Maternal Grandmother   . Liver cancer Paternal Uncle   . Liver cancer Paternal Grandmother      History   Social History  . Marital Status: Married    Spouse Name: N/A  . Number of Children: 2  . Years of Education: N/A   Occupational History  . 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 Smoking status: Never Smoker   . Smokeless tobacco: Never Used  . Alcohol Use: Yes     Comment: occasional  . Drug Use: No  . Sexual Activity: Not on file   Other Topics Concern  . Not on file   Social History Narrative   In school for culinary arts--- will finish 12/16      Has living will   Wife is health care POA   Would accept resuscitation attempts   Hasn't considered tube feedings      PHYSICAL EXAM   BP 150/88 mmHg  Pulse 43  Ht _0  (1.803 m)  Wt 280 lb 12 oz (127.347 kg)  BMI 39.17 kg/m2 Constitutional: He is oriented to person, place, and time. He appears well-developed and well-nourished. No distress.  HENT: No nasal discharge.  Head: Normocephalic and atraumatic.  Eyes: Pupils are equal and round.  No discharge. Neck: Normal range of motion. Neck supple. No JVD present. No thyromegaly present.  Cardiovascular: Bradycardic, irregular rhythm, normal heart sounds. Exam reveals no gallop and no friction rub. No murmur heard.  Pulmonary/Chest: Effort normal and breath sounds normal. No stridor. No respiratory  distress. He has no wheezes. He has no rales. He exhibits no tenderness.  Abdominal: Soft. Bowel sounds are normal. He exhibits no distension. There is no tenderness. There is no rebound and no guarding.  Musculoskeletal: Normal range of motion. He exhibits no edema and no tenderness.  Neurological: He is alert and oriented to person, place, and time. Coordination normal.  Skin: Skin is warm and dry. No  rash noted. He is not diaphoretic. No erythema. No pallor.  Psychiatric: He has a normal mood and affect. His behavior is normal. Judgment and thought content normal.       QIX:MDEKIY fibrillation  -Left bundle branch block and left axis.   ABNORMAL     ASSESSMENT AND PLAN

## 2014-07-18 NOTE — Assessment & Plan Note (Signed)
He appears to be euvolemic. Continue treatment with small dose carvedilol. I increased the dose of lisinopril to 40 mg once daily.

## 2014-07-18 NOTE — Assessment & Plan Note (Signed)
He has no symptoms of angina. I discontinued aspirin given the initiation of anticoagulation.

## 2014-07-18 NOTE — Assessment & Plan Note (Signed)
The patient is noted to be in atrial fibrillation today which is a new diagnosis for him and has not been noted on previous EKGs. Ventricular rate is actually slow. He seems to be asymptomatic from this. I decreased the dose of carvedilol to 3.125 mg twice daily. CHADS VASc score is 4. No previous bleeding complications. Thus, I recommend long-term anticoagulation in order to decrease thromboembolic complications of atrial fibrillation. I discussed different management options including warfarin versus NOACs. We decided to start Eliquis 5 mg twice daily. His kidney function in February was normal and recent CBC was unremarkable.

## 2014-07-18 NOTE — Patient Instructions (Signed)
Medication Instructions:  Decrease Coreg ( 3.125) mg twice a day Increase Lisinopril ( 40 mg ) daily Start Eliquis ( 5 mg ) twice a day Stop Asprin   Labwork: None  Testing/Procedures: None  Follow-Up: 2 months  Any Other Special Instructions Will Be Listed Below (If Applicable).

## 2014-07-22 ENCOUNTER — Encounter: Payer: Self-pay | Admitting: Internal Medicine

## 2014-09-02 ENCOUNTER — Ambulatory Visit (INDEPENDENT_AMBULATORY_CARE_PROVIDER_SITE_OTHER): Payer: 59 | Admitting: Urology

## 2014-09-02 ENCOUNTER — Encounter: Payer: Self-pay | Admitting: Urology

## 2014-09-02 VITALS — BP 138/67 | HR 67

## 2014-09-02 DIAGNOSIS — Z302 Encounter for sterilization: Secondary | ICD-10-CM

## 2014-09-02 MED ORDER — HYDROCODONE-ACETAMINOPHEN 5-325 MG PO TABS: 1.0000 | ORAL_TABLET | Freq: Four times a day (QID) | ORAL | Status: DC | PRN

## 2014-09-02 MED ORDER — CEPHALEXIN 500 MG PO CAPS: 500.0000 mg | ORAL_CAPSULE | Freq: Three times a day (TID) | ORAL | Status: DC

## 2014-09-02 NOTE — Patient Instructions (Signed)
Vasectomy                                    Patient Education and Post Care   If you were provided a sedative (Valium) you must have someone available to drive you home after your procedure. Avoid strenuous activity for 48 hours after your procedure. This includes any heavy lifting. You may return to work the next day, as long as no strenuous activity is required. Leave bandage in place for the next 24hrs. If you have any difficulty urinating remove you may remove bandage earlier. Then keep area clean and dry. You may shower after 24 hours. Do not take a bath or use a hot tub for five (5) days. You may apply ice or a cold pack to the scrotum as needed, for 10 minutes of every hours. (A bag of frozen peas will mold to the area). This may help reduce any pain or swelling. It may also be helpful to wear supportive underwear, such as briefs. Expect some mild pain, mild swelling of the scrotum and possible slight fluid leak from the site of the puncture. A gauze pad may be applied to the scrotum if there is any leakage from the puncture site. This drainage may continue for several days and is normal. You may continue your normal diet. After the procedure, you will be given 1-2 specimen cups. You need to do sperm checks at 6 and/or 12 weeks. Please bring the specimen back to our office to be sent out for analysis. You may resume having sex 1 week after procedure, if comfortable enough. Until you are told that you are sterile, it is essential that you use another form of birth control until otherwise notified by our office. Take Extra-Strength Tylenol, Motrin or Advil as needed for any pain or discomfort. Follow the package directions regarding dose. Stronger prescription pain medication is provided, but most people do not find that it is necessary. If you experience unusual or severe pain that is not relieved by pain medication, excessive bleeding  or drainage, excessive swelling or redness, foul odor or a fever over 101 F, please contact our office.  [facility] [address] [place], [place] [address] [phone]

## 2014-09-02 NOTE — Progress Notes (Signed)
09/02/2014 11:13 AM   [name] [birth year] [id number]  Referring provider: [name], MD [address] [place], [place] [address]  Chief Complaint  Patient presents with  . Sterilization    dizziness, lightheadedness, and letharcy after taking the Valium this morning.     HPI: 54 year old male who presents today for vasectomy.  He was previously seen in evaluation for the procedure and all the risks were reviewed. Consent was previously obtained verbally and his wife is present today who is able to sign his consent form. He has taken Valium for the procedure today.  He has 2 children and is currently married. His children are 19 and 17. He is certain that he does not desire any further biological pregnancies.  He has held his blood thinners 1 week. He does have a history of renal transplantation and is currently on immunosuppression.  PMH: Past Medical History  Diagnosis Date  . Hypertension   . Hyperlipidemia     low since renal failure  . Kidney transplant status, cadaveric 2012  . Depression   . GERD (gastroesophageal reflux disease)   . Diabetes mellitus   . Gout   . Asthma   . ESRD (end stage renal disease) 2005    Dialysis then renal transplant  . CAD (coronary artery disease) 2005    had CABG  . CHF (congestive heart failure)   . Pneumonia   . Wears glasses   . Sleep apnea     bipap with oxygen 2 l    Surgical History: Past Surgical History  Procedure Laterality Date  . Kidney transplant  09/13/2010    cadaver--at [facility]  . Tympanic membrane repair  1/12    left  . Dg angio av shunt*l*      right and left upper arms  . Fasciotomy  03/03/2012    Procedure: FASCIOTOMY;  Surgeon: [name], MD;  Location: [place];  Service: Orthopedics;  Laterality: Right;  FASCIOTOMY RIGHT SMALL FINGER  . Kidney transplant    . Cardiac catheterization      [facility]  . Coronary artery bypass graft  2005    X 4  . Colonoscopy with  propofol N/A 04/22/2013    Procedure: COLONOSCOPY WITH PROPOFOL;  Surgeon: [name], MD;  Location: [facility];  Service: Endoscopy;  Laterality: N/A;  . Fasciotomy Left 08/17/2013    Procedure: FASCIOTOMY LEFT RING;  Surgeon: [name], MD;  Location: [place];  Service: Orthopedics;  Laterality: Left;  . Bariatric surgery      Home Medications:    Medication List       This list is accurate as of: 09/02/14 11:13 AM.  Always use your most recent med list.               albuterol 108 (90 BASE) MCG/ACT inhaler  Commonly known as:  PROAIR HFA  Inhale 2 puffs into the lungs 4 (four) times daily as needed for wheezing or shortness of breath (cough).     apixaban 5 MG Tabs tablet  Commonly known as:  ELIQUIS  Take 1 tablet (5 mg total) by mouth 2 (two) times daily.     beclomethasone 80 MCG/ACT inhaler  Commonly known as:  QVAR  Inhale 2 puffs into the lungs 2 (two) times daily.     buPROPion 75 MG tablet  Commonly known as:  WELLBUTRIN  Take 75 mg by mouth 2 (two) times daily.  carvedilol 3.125 MG tablet  Commonly known as:  COREG  Take 1 tablet (3.125 mg total) by mouth 2 (two) times daily.     cephALEXin 500 MG capsule  Commonly known as:  KEFLEX  Take 1 capsule (500 mg total) by mouth 3 (three) times daily.     CIALIS 20 MG tablet  Generic drug:  tadalafil  Take 1/2 to 1 tablet every other day as needed for erectile dysfunction.     clotrimazole 1 % cream  Commonly known as:  LOTRIMIN AF  Apply 1 application topically 2 (two) times daily.     colchicine 0.6 MG tablet  Take 1 tablet (0.6 mg total) by mouth 2 (two) times daily as needed.     furosemide 40 MG tablet  Commonly known as:  LASIX  Take 80 mg by mouth every other day.     HYDROcodone-acetaminophen 5-325 MG per tablet  Commonly known as:  NORCO/VICODIN  Take 1-2 tablets by mouth every 6 (six) hours as needed for moderate pain.     insulin glargine 100 UNIT/ML injection    Commonly known as:  LANTUS  Inject 7 Units into the skin at bedtime.     lisinopril 40 MG tablet  Commonly known as:  PRINIVIL,ZESTRIL  Take 1 tablet (40 mg total) by mouth daily.     metolazone 2.5 MG tablet  Commonly known as:  ZAROXOLYN  Take 2.5 mg by mouth daily as needed (for excess fluid).     multivitamin with minerals Tabs tablet  Take 1 tablet by mouth daily.     mycophenolate 180 MG EC tablet  Commonly known as:  MYFORTIC  Take 720 mg by mouth 2 (two) times daily.     NASONEX 50 MCG/ACT nasal spray  Generic drug:  mometasone  USE 2 SPRAYS IN EACH NOSTRIL DAILY     omeprazole 20 MG capsule  Commonly known as:  PRILOSEC  TAKE 1 CAPSULE BY MOUTH DAILY.     potassium chloride SA 20 MEQ tablet  Commonly known as:  K-DUR,KLOR-CON  Take 60 mEq by mouth 2 (two) times daily.     tacrolimus 1 MG capsule  Commonly known as:  PROGRAF  Take 1 mg by mouth 2 (two) times daily.        Allergies:  Allergies  Allergen Reactions  . Morphine     MORPHINE, puritis    Family History: Family History  Problem Relation Age of Onset  . Heart disease Father   . Diabetes Maternal Grandmother   . Breast cancer Maternal Grandmother   . Liver cancer Paternal Uncle   . Liver cancer Paternal Grandmother     Social History:  reports that he quit smoking about 20 years ago. He has never used smokeless tobacco. He reports that he drinks about 0.6 oz of alcohol per week. He reports that he does not use illicit drugs.   Physical Exam: BP 138/67 mmHg  Pulse 67  Constitutional:  Alert and oriented, No acute distress. HEENT: [place] AT, moist mucus membranes.  Trachea midline, no masses. Cardiovascular: No clubbing, cyanosis, or edema. Respiratory: Normal respiratory effort, no increased work of breathing. GI: Abdomen is soft, nontender, nondistended, no abdominal masses GU: No CVA tenderness. Scrotum is quite full with thickened cords bilaterally.  Vasa palpable bilaterally. Phallus  is circumcised Skin: No rashes, bruises or suspicious lesions. Neurologic: Grossly intact, no focal deficits, moving all 4 extremities. Psychiatric: Normal mood and affect.  Laboratory Data: Lab Results  Component  Value Date   WBC 6.0 07/11/2014   HGB 13.7 07/11/2014   HCT 40.8 07/11/2014   MCV 80.7 07/11/2014   PLT 175.0 07/11/2014    Lab Results  Component Value Date   CREATININE 0.83 06/16/2014    Lab Results  Component Value Date   PSA 0.68 10/20/2012     Lab Results  Component Value Date   HGBA1C 8.6 01/03/2014    Bilateral Vasectomy Procedure  Pre-Procedure: - Patient's scrotum was prepped and draped for vasectomy. - The vas was palpated through the scrotal skin on the left. - 1% Xylocaine was injected into the skin and surrounding tissue for placement  - In a similar manner, the vas on the right was identified, anesthetized, and stabilized.  Procedure: - A bladeless technique was used to open the overlying skin (sharp dissection) - The left vas was isolated and brought up through the incision exposing that structure. - Bleeding points were cauterized as they occurred. - The vas was free from the surrounding structures and brought to the view.  Of note, his cord was relatively thickened left side greater than right with evidence of lipoma of the cord on the left.  - A segment was positioned for placement with a hemostat. - A second hemostat was placed and a small segment between the two hemostats and was removed for inspection. - Each end of the transected vas lumen was fulgurated/ obliterated using needlepoint electrocautery -A fascial interposition was performed on testicular end of the vas using #3-0 chromic suture -The same procedure was performed on the right. - A single suture of #3-0 chromic catgut was used to close each lateral scrotal skin incision - A dressing was applied.  Post-Procedure: - Patient was instructed in care of the operative area - A  specimen is to be delivered in 12 weeks   -Another form of contraception is to be used until    Assessment & Plan:  Status post uncomplicated vasectomy today. Post procedural instructions were reviewed. Given his history of immunosuppression. I would like to go ahead and treat him prophylactically with Keflex 3 days. Additionally, given his inability take NSAIDs for pain, I have prescribed him Vicodin times #5 tabs for pain control.    Patient advised to use alternative contraception of method until post vasectomy semen analysis. Instructions for semen analysis drop off in 3 months given.   Return if symptoms worsen or fail to improve.  Hollice Espy, MD  [facility] [address] [place], [place] [address] [phone]

## 2014-09-06 LAB — HM DIABETES EYE EXAM

## 2014-09-09 ENCOUNTER — Ambulatory Visit: Payer: Self-pay | Admitting: Urology

## 2014-09-09 ENCOUNTER — Encounter: Payer: Self-pay | Admitting: Internal Medicine

## 2014-09-09 DIAGNOSIS — E1165 Type 2 diabetes mellitus with hyperglycemia: Secondary | ICD-10-CM | POA: Insufficient documentation

## 2014-09-09 DIAGNOSIS — D849 Immunodeficiency, unspecified: Secondary | ICD-10-CM

## 2014-09-09 DIAGNOSIS — Z794 Long term (current) use of insulin: Secondary | ICD-10-CM | POA: Insufficient documentation

## 2014-09-09 DIAGNOSIS — D899 Disorder involving the immune mechanism, unspecified: Secondary | ICD-10-CM

## 2014-09-09 HISTORY — DX: Type 2 diabetes mellitus with hyperglycemia: E11.65

## 2014-09-09 HISTORY — DX: Immunodeficiency, unspecified: D84.9

## 2014-09-09 LAB — LIPID PANEL
HDL: 30 mg/dL — AB (ref 35–70)
LDL CALC: 92 mg/dL

## 2014-09-09 LAB — HEMOGLOBIN A1C: HEMOGLOBIN A1C: 7.2 % — AB (ref 4.0–6.0)

## 2014-09-09 LAB — BASIC METABOLIC PANEL: Creatinine: 0.9 mg/dL (ref <=1.3)

## 2014-09-12 ENCOUNTER — Ambulatory Visit: Payer: 59 | Admitting: Cardiovascular Disease

## 2014-09-13 ENCOUNTER — Other Ambulatory Visit: Payer: Self-pay

## 2014-09-23 ENCOUNTER — Encounter: Payer: Self-pay | Admitting: Urology

## 2014-09-23 ENCOUNTER — Ambulatory Visit (INDEPENDENT_AMBULATORY_CARE_PROVIDER_SITE_OTHER): Payer: 59 | Admitting: Urology

## 2014-09-23 VITALS — BP 164/81 | HR 59 | Ht 71.0 in | Wt 268.0 lb

## 2014-09-23 DIAGNOSIS — N529 Male erectile dysfunction, unspecified: Secondary | ICD-10-CM

## 2014-09-23 DIAGNOSIS — Z940 Kidney transplant status: Secondary | ICD-10-CM | POA: Diagnosis not present

## 2014-09-23 DIAGNOSIS — I2510 Atherosclerotic heart disease of native coronary artery without angina pectoris: Secondary | ICD-10-CM

## 2014-09-23 DIAGNOSIS — E291 Testicular hypofunction: Secondary | ICD-10-CM

## 2014-09-23 DIAGNOSIS — N401 Enlarged prostate with lower urinary tract symptoms: Secondary | ICD-10-CM | POA: Diagnosis not present

## 2014-09-23 DIAGNOSIS — N138 Other obstructive and reflux uropathy: Secondary | ICD-10-CM

## 2014-09-23 LAB — MICROSCOPIC EXAMINATION: Bacteria, UA: NONE SEEN

## 2014-09-23 LAB — URINALYSIS, COMPLETE
Bilirubin, UA: NEGATIVE
GLUCOSE, UA: NEGATIVE
Ketones, UA: NEGATIVE
LEUKOCYTES UA: NEGATIVE
Nitrite, UA: NEGATIVE
Specific Gravity, UA: >1.03 — ABNORMAL HIGH (ref 1.005–1.030)
UUROB: 4 mg/dL — AB (ref 0.2–1.0)
pH, UA: 5.5 (ref 5.0–7.5)

## 2014-09-23 NOTE — Progress Notes (Signed)
09/23/2014 9:16 AM   [name] Mar 08, 1961 [id number]  Referring provider: [name], MD [address] [place], [place] [address]  Chief Complaint  Patient presents with  . Hypogonadism    discuss testosterone results    HPI:   [name] is a 54 year old Hispanic male who presents today to discuss hypogonadism treatment.  His morning testosterone on 08/05/2014 was 244 ng/dL and repeated morning testosterone level was 213 on 08/11/2014. His FSH, LH and prolactin were within normal limits.  His wife reminded him that he had been on Testim 1% back in 2011 prescribed by Dr. Madelin Headings. He would like to discuss other options because the fragrance of the Testim cream was overpowering to him.  I discussed with the patient to be available therapies for testosterone, such as: Topicals, nasal spray, pellets, injections, patch and buccal pellets. He would like to have the pellets.  He does complain that his testicles were too large and I stated that they may reduce in size when he begins testosterone therapy. He has filled out the ADAM questionnaire.       Androgen Deficiency in the Aging Male      09/23/14 0900       Androgen Deficiency in the Aging Male   Do you have a decrease in libido (sex drive) Yes     Do you have lack of energy Yes     Do you have a decrease in strength and/or endurance Yes     Have you lost height Yes     Have you noticed a decreased "enjoyment of life" Yes     Are you sad and/or grumpy No     Are your erections less strong Yes     Have you noticed a recent deterioration in your ability to play sports Yes     Are you falling asleep after dinner No     Has there been a recent deterioration in your work performance No        We obtained a baseline IPSS today.  His score is 6/1, which is mild symptomatology.  He has mild urinary frequency, urgency, weak stream, straining and nocturia. He denies any dysuria, suprapubic pain and gross hematuria.  We  will draw a PSA today.     IPSS      09/23/14 0900       International Prostate Symptom Score   How often have you had the sensation of not emptying your bladder? Less than 1 in 5     How often have you had to urinate less than every two hours? Less than 1 in 5 times     How often have you found you stopped and started again several times when you urinated? Not at All     How often have you found it difficult to postpone urination? Less than 1 in 5 times     How often have you had a weak urinary stream? Less than 1 in 5 times     How often have you had to strain to start urination? Less than 1 in 5 times     How many times did you typically get up at night to urinate? 1 Time     Total IPSS Score 6     Quality of Life due to urinary symptoms   If you were to spend the rest of your life with your urinary condition just the way it is now how would you feel about that?  Pleased     PMH: Past Medical History  Diagnosis Date  . Hypertension   . Hyperlipidemia     low since renal failure  . Kidney transplant status, cadaveric 2012  . Depression   . GERD (gastroesophageal reflux disease)   . Diabetes mellitus   . Gout   . Asthma   . ESRD (end stage renal disease) 2005    Dialysis then renal transplant  . CAD (coronary artery disease) 2005    had CABG  . CHF (congestive heart failure)   . Pneumonia   . Wears glasses   . Sleep apnea     bipap with oxygen 2 l    Surgical History: Past Surgical History  Procedure Laterality Date  . Kidney transplant  09/13/2010    cadaver--at [facility]  . Tympanic membrane repair  1/12    left  . Dg angio av shunt*l*      right and left upper arms  . Fasciotomy  03/03/2012    Procedure: FASCIOTOMY;  Surgeon: [name], MD;  Location: [place];  Service: Orthopedics;  Laterality: Right;  FASCIOTOMY RIGHT SMALL FINGER  . Cardiac catheterization      [facility]  . Coronary artery bypass graft  2005    X 4  . Colonoscopy with  propofol N/A 04/22/2013    Procedure: COLONOSCOPY WITH PROPOFOL;  Surgeon: [name], MD;  Location: [facility];  Service: Endoscopy;  Laterality: N/A;  . Fasciotomy Left 08/17/2013    Procedure: FASCIOTOMY LEFT RING;  Surgeon: [name], MD;  Location: [place];  Service: Orthopedics;  Laterality: Left;  . Bariatric surgery      Home Medications:    Medication List       This list is accurate as of: 09/23/14  9:16 AM.  Always use your most recent med list.               albuterol 108 (90 BASE) MCG/ACT inhaler  Commonly known as:  PROAIR HFA  Inhale 2 puffs into the lungs 4 (four) times daily as needed for wheezing or shortness of breath (cough).     apixaban 5 MG Tabs tablet  Commonly known as:  ELIQUIS  Take 1 tablet (5 mg total) by mouth 2 (two) times daily.     beclomethasone 80 MCG/ACT inhaler  Commonly known as:  QVAR  Inhale 2 puffs into the lungs 2 (two) times daily.     buPROPion 75 MG tablet  Commonly known as:  WELLBUTRIN  Take 75 mg by mouth 2 (two) times daily.     carvedilol 3.125 MG tablet  Commonly known as:  COREG  Take 1 tablet (3.125 mg total) by mouth 2 (two) times daily.     CIALIS 20 MG tablet  Generic drug:  tadalafil  Take 1/2 to 1 tablet every other day as needed for erectile dysfunction.     colchicine 0.6 MG tablet  Take 1 tablet (0.6 mg total) by mouth 2 (two) times daily as needed.     furosemide 40 MG tablet  Commonly known as:  LASIX  Take 80 mg by mouth every other day.     HYDROcodone-acetaminophen 5-325 MG per tablet  Commonly known as:  NORCO/VICODIN  Take 1-2 tablets by mouth every 6 (six) hours as needed for moderate pain.     insulin glargine 100 UNIT/ML injection  Commonly known as:  LANTUS  Inject 7 Units into the skin at bedtime.  lisinopril 40 MG tablet  Commonly known as:  PRINIVIL,ZESTRIL  Take 1 tablet (40 mg total) by mouth daily.     metolazone 2.5 MG tablet  Commonly known as:   ZAROXOLYN  Take 2.5 mg by mouth daily as needed (for excess fluid).     multivitamin with minerals Tabs tablet  Take 1 tablet by mouth daily.     mycophenolate 180 MG EC tablet  Commonly known as:  MYFORTIC  Take 720 mg by mouth 2 (two) times daily.     NASONEX 50 MCG/ACT nasal spray  Generic drug:  mometasone  USE 2 SPRAYS IN EACH NOSTRIL DAILY     omeprazole 20 MG capsule  Commonly known as:  PRILOSEC  TAKE 1 CAPSULE BY MOUTH DAILY.     potassium chloride SA 20 MEQ tablet  Commonly known as:  K-DUR,KLOR-CON  Take 60 mEq by mouth 2 (two) times daily.     tacrolimus 1 MG capsule  Commonly known as:  PROGRAF  Take 1 mg by mouth 2 (two) times daily.        Allergies:  Allergies  Allergen Reactions  . Morphine     MORPHINE, puritis    Family History: Family History  Problem Relation Age of Onset  . Heart disease Father   . Diabetes Maternal Grandmother   . Breast cancer Maternal Grandmother   . Liver cancer Paternal Uncle   . Liver cancer Paternal Grandmother     Social History:  reports that he quit smoking about 20 years ago. He has never used smokeless tobacco. He reports that he drinks about 0.6 oz of alcohol per week. He reports that he does not use illicit drugs.  ROS: Urological Symptom Review  Patient is experiencing the following symptoms: Get up at night to urinate Erection problems (male only)   Review of Systems  Gastrointestinal (upper)  : Negative for upper GI symptoms  Gastrointestinal (lower) : Negative for lower GI symptoms  Constitutional : Fatigue  Skin: Negative for skin symptoms  Eyes: Negative for eye symptoms  Ear/Nose/Throat : Negative for Ear/Nose/Throat symptoms  Hematologic/Lymphatic: Negative for Hematologic/Lymphatic symptoms  Cardiovascular : Negative for cardiovascular symptoms  Respiratory : Negative for respiratory symptoms  Endocrine: Negative for endocrine symptoms  Musculoskeletal: Negative for  musculoskeletal symptoms  Neurological: Negative for neurological symptoms  Psychologic: Negative for psychiatric symptoms   Physical Exam: BP 164/81 mmHg  Pulse 59  Ht _0  (1.803 m)  Wt 268 lb (121.564 kg)  BMI 37.39 kg/m2   Laboratory Data:   Results for orders placed or performed in visit on 09/23/14  Microscopic Examination  Result Value Ref Range   WBC, UA 0-5 0 -  5 /hpf   RBC, UA 0-2 0 -  2 /hpf   Epithelial Cells (non renal) 0-10 0 - 10 /hpf   Bacteria, UA None seen None seen/Few  Urinalysis, Complete  Result Value Ref Range   Specific Gravity, UA >1.030 (H) 1.005 - 1.030   pH, UA 5.5 5.0 - 7.5   Color, UA Amber (A) Yellow   Appearance Ur Clear Clear   Leukocytes, UA Negative Negative   Protein, UA 2+ (A) Negative/Trace   Glucose, UA Negative Negative   Ketones, UA Negative Negative   RBC, UA Trace (A) Negative   Bilirubin, UA Negative Negative   Urobilinogen, Ur 4.0 (H) 0.2 - 1.0 mg/dL   Nitrite, UA Negative Negative   Microscopic Examination See below:   PSA  Result Value Ref  Range   Prostate Specific Ag, Serum 1.0 0.0 - 4.0 ng/mL   Lab Results  Component Value Date   WBC 6.0 07/11/2014   HGB 13.7 07/11/2014   HCT 40.8 07/11/2014   MCV 80.7 07/11/2014   PLT 175.0 07/11/2014    Lab Results  Component Value Date   CREATININE 0.83 06/16/2014    Lab Results  Component Value Date   PSA 0.68 10/20/2012    No results found for: TESTOSTERONE  Lab Results  Component Value Date   HGBA1C 8.6 01/03/2014    Urinalysis No results found for: COLORURINE, APPEARANCEUR, LABSPEC, PHURINE, GLUCOSEU, HGBUR, BILIRUBINUR, KETONESUR, PROTEINUR, UROBILINOGEN, NITRITE, LEUKOCYTESUR  Pertinent Imaging:   Assessment & Plan:    1. Hypogonadism:  Patient has filled out the Testopel reimbursement forms. I have given him the Testopel brochure. We will schedule Testopel if PSA levels return normal and patient agrees with his insurance coverage.   2. BPH  with LUTS:   Patient's baseline IPSS is 6/1, which is mild symptomatology. We will reevaluate in 6 months with a repeat IPSS score, DRE and PSA.  - Urinalysis, Complete - PSA  3. H/O kidney transplant:   Recent Cr 0.83 in 05/2014.  4. Erectile dysfunction:  Improved with Cialis 20 mg on-demand dosing.  5. Post vasectomy:  Patient underwent vasectomy on 09/02/2014 with Dr. Erlene Quan. He is reminded to use alternative forms of birth control and tell we received his 3 month specimen and it is clear of sperm.           No Follow-up on file.  Zara Council, [facility] [address], [place], [place] [address] [phone]

## 2014-09-24 LAB — PSA: PROSTATE SPECIFIC AG, SERUM: 1 ng/mL (ref 0.0–4.0)

## 2014-09-25 DIAGNOSIS — N401 Enlarged prostate with lower urinary tract symptoms: Secondary | ICD-10-CM

## 2014-09-25 DIAGNOSIS — N138 Other obstructive and reflux uropathy: Secondary | ICD-10-CM

## 2014-09-25 DIAGNOSIS — E291 Testicular hypofunction: Secondary | ICD-10-CM

## 2014-09-25 DIAGNOSIS — N400 Enlarged prostate without lower urinary tract symptoms: Secondary | ICD-10-CM | POA: Insufficient documentation

## 2014-09-25 HISTORY — DX: Testicular hypofunction: E29.1

## 2014-09-25 HISTORY — DX: Other obstructive and reflux uropathy: N13.8

## 2014-09-27 ENCOUNTER — Telehealth: Payer: Self-pay

## 2014-09-27 NOTE — Telephone Encounter (Signed)
-----  Message from Nori Riis, PA-C sent at 09/25/2014 10:08 PM EDT ----- PSA is normal.

## 2014-09-27 NOTE — Telephone Encounter (Signed)
Made pt aware of lab results. Pt voiced understanding. Cw,lpn

## 2014-10-06 ENCOUNTER — Encounter: Payer: 59 | Admitting: Internal Medicine

## 2014-10-12 ENCOUNTER — Other Ambulatory Visit: Payer: Self-pay

## 2014-10-12 VITALS — BP 150/84 | HR 52 | Ht 71.0 in | Wt 276.2 lb

## 2014-10-12 DIAGNOSIS — E118 Type 2 diabetes mellitus with unspecified complications: Secondary | ICD-10-CM

## 2014-10-12 NOTE — Patient Outreach (Signed)
[facility]) Care Management  [place]  10/12/2014   [name] [birth year] [id number]  Subjective: Patient comes for his regular visit.  He has seen a urologist for a vasectomy, his cardiac doctor for a regular check up, his transplant team but cancelled his appointment with his family MD.  He is not scheduled with an endocrinologist.  He feels well.  He is on a 3 week break from school.  He reports feeling very stressed with school, finding it difficult to fit in exercise.  He often does not feel hungry and has to remember to eat- he is drinking between 2-4 protein shakes per day and carries small snacks to school.  He wishes to lose 50 more lbs but doesn't know "how I'll o it- I probably need to figure how to fit in exercise".  Encouraged exercise for stress management and overall health.  Objective:  Filed Vitals:   10/12/14 0832  BP: 150/84  Pulse: 52   NO blood sugar meter- reports blood sugars 95-116m/dl; 949mdl in the am, 16045ml post meals  Current Medications:  Current Outpatient Prescriptions  Medication Sig Dispense Refill  . albuterol (PROAIR HFA) 108 (90 BASE) MCG/ACT inhaler Inhale 2 puffs into the lungs 4 (four) times daily as needed for wheezing or shortness of breath (cough). 1 Inhaler 3  . apixaban (ELIQUIS) 5 MG TABS tablet Take 1 tablet (5 mg total) by mouth 2 (two) times daily. 60 tablet 6  . beclomethasone (QVAR) 80 MCG/ACT inhaler Inhale 2 puffs into the lungs 2 (two) times daily. 3 Inhaler 3  . buPROPion (WELLBUTRIN) 75 MG tablet Take 75 mg by mouth 2 (two) times daily.    . carvedilol (COREG) 3.125 MG tablet Take 1 tablet (3.125 mg total) by mouth 2 (two) times daily. 60 tablet 6  . CIALIS 20 MG tablet Take 1/2 to 1 tablet every other day as needed for erectile dysfunction. 6 tablet 11  . colchicine 0.6 MG tablet Take 1 tablet (0.6 mg total) by mouth 2 (two) times daily as needed. (Patient taking differently: Take 0.6 mg by mouth 2  (two) times daily as needed. Has not required recently) 60 tablet 2  . furosemide (LASIX) 40 MG tablet Take 80 mg by mouth as needed.     . insulin glargine (LANTUS) 100 UNIT/ML injection Inject 7 Units into the skin at bedtime.     . lMarland Kitchensinopril (PRINIVIL,ZESTRIL) 40 MG tablet Take 1 tablet (40 mg total) by mouth daily. 30 tablet 6  . metolazone (ZAROXOLYN) 2.5 MG tablet Take 2.5 mg by mouth daily as needed (for excess fluid).     . Multiple Vitamin (MULTIVITAMIN WITH MINERALS) TABS tablet Take 1 tablet by mouth daily.    . mycophenolate (MYFORTIC) 180 MG EC tablet Take 720 mg by mouth 2 (two) times daily.     . NMarland KitchenSONEX 50 MCG/ACT nasal spray USE 2 SPRAYS IN EACH NOSTRIL DAILY 17 g 11  . omeprazole (PRILOSEC) 20 MG capsule TAKE 1 CAPSULE BY MOUTH DAILY. 30 capsule 5  . tacrolimus (PROGRAF) 1 MG capsule Take 1 mg by mouth 2 (two) times daily.     . HMarland KitchenDROcodone-acetaminophen (NORCO/VICODIN) 5-325 MG per tablet Take 1-2 tablets by mouth every 6 (six) hours as needed for moderate pain. (Patient not taking: Reported on 09/23/2014) 6 tablet 0  . potassium chloride SA (K-DUR,KLOR-CON) 20 MEQ tablet Take 60 mEq by mouth 2 (two) times daily.      No current facility-administered medications  for this visit.    Functional Status:  In your present state of health, do you have any difficulty performing the following activities: 10/12/2014  Hearing? N  Vision? N  Difficulty concentrating or making decisions? Y  Walking or climbing stairs? N  Dressing or bathing? N  Doing errands, shopping? N    Fall/Depression Screening: PHQ 2/9 Scores 10/12/2014  PHQ - 2 Score 0    Assessment: Patient is very busy with school and he's neglecting some preventative tests that he should have done.   Recommend 1. Re-schedule visit with Doctor Silvio Pate for September 2. Call [name] with current HR; 48-52bpm 3. Check BP and HR daily and bring report to [name] at the next MD visit 4. Make an appointment with the  dermatologist 5. Schedule an appointment with your dentist 6. Check blood sugars once a day and bring report to all MD visits 7. Create and carry med-list with you at all times 8. Continue to weigh daily 9. Schedule with me for October, 2016  Plan:  [place] Problem One        Patient Outreach from 10/12/2014 in [place] Problem One  Potential for long term complications with elevated A1C   Care Plan for Problem One  Active   [facility] Long Term Goal (31-90 days)  Patient will decrease A1C to less than 7.2%   [facility] Long Term Goal Start Date  10/12/14   Interventions for Problem One Long Term Goal  monitor blood sugar at least once a day     Follow up with me in three months.   [name], RN, BA, [place], [place] Direct Dial:  [phone]  Fax:  [phone] E-mail: [name] [address] [place],   [address]

## 2014-10-13 ENCOUNTER — Inpatient Hospital Stay (HOSPITAL_COMMUNITY)
Admit: 2014-10-13 | Discharge: 2014-10-13 | Disposition: A | Discharge status: Home / Self Care | Payer: 59 | Attending: Internal Medicine | Admitting: Internal Medicine

## 2014-10-13 ENCOUNTER — Emergency Department: Payer: 59

## 2014-10-13 ENCOUNTER — Inpatient Hospital Stay
Admission: EM | Admit: 2014-10-13 | Discharge: 2014-10-14 | DRG: 309 | Disposition: A | Discharge status: Home / Self Care | Payer: 59 | Attending: Internal Medicine | Admitting: Internal Medicine

## 2014-10-13 ENCOUNTER — Encounter: Payer: Self-pay | Admitting: Emergency Medicine

## 2014-10-13 DIAGNOSIS — R001 Bradycardia, unspecified: Principal | ICD-10-CM | POA: Diagnosis present

## 2014-10-13 DIAGNOSIS — R748 Abnormal levels of other serum enzymes: Secondary | ICD-10-CM | POA: Diagnosis present

## 2014-10-13 DIAGNOSIS — F329 Major depressive disorder, single episode, unspecified: Secondary | ICD-10-CM | POA: Diagnosis present

## 2014-10-13 DIAGNOSIS — I482 Chronic atrial fibrillation, unspecified: Secondary | ICD-10-CM | POA: Diagnosis present

## 2014-10-13 DIAGNOSIS — Z951 Presence of aortocoronary bypass graft: Secondary | ICD-10-CM | POA: Diagnosis not present

## 2014-10-13 DIAGNOSIS — R079 Chest pain, unspecified: Secondary | ICD-10-CM | POA: Diagnosis not present

## 2014-10-13 DIAGNOSIS — Z940 Kidney transplant status: Secondary | ICD-10-CM

## 2014-10-13 DIAGNOSIS — M109 Gout, unspecified: Secondary | ICD-10-CM | POA: Diagnosis present

## 2014-10-13 DIAGNOSIS — Z87891 Personal history of nicotine dependence: Secondary | ICD-10-CM

## 2014-10-13 DIAGNOSIS — E785 Hyperlipidemia, unspecified: Secondary | ICD-10-CM | POA: Diagnosis present

## 2014-10-13 DIAGNOSIS — I5022 Chronic systolic (congestive) heart failure: Secondary | ICD-10-CM | POA: Diagnosis present

## 2014-10-13 DIAGNOSIS — Z833 Family history of diabetes mellitus: Secondary | ICD-10-CM | POA: Diagnosis not present

## 2014-10-13 DIAGNOSIS — Z79899 Other long term (current) drug therapy: Secondary | ICD-10-CM

## 2014-10-13 DIAGNOSIS — I200 Unstable angina: Secondary | ICD-10-CM | POA: Diagnosis not present

## 2014-10-13 DIAGNOSIS — E876 Hypokalemia: Secondary | ICD-10-CM | POA: Diagnosis present

## 2014-10-13 DIAGNOSIS — G4730 Sleep apnea, unspecified: Secondary | ICD-10-CM | POA: Diagnosis present

## 2014-10-13 DIAGNOSIS — Z9884 Bariatric surgery status: Secondary | ICD-10-CM

## 2014-10-13 DIAGNOSIS — J45909 Unspecified asthma, uncomplicated: Secondary | ICD-10-CM | POA: Diagnosis present

## 2014-10-13 DIAGNOSIS — I472 Ventricular tachycardia: Secondary | ICD-10-CM | POA: Diagnosis present

## 2014-10-13 DIAGNOSIS — E669 Obesity, unspecified: Secondary | ICD-10-CM | POA: Diagnosis present

## 2014-10-13 DIAGNOSIS — Z794 Long term (current) use of insulin: Secondary | ICD-10-CM | POA: Diagnosis not present

## 2014-10-13 DIAGNOSIS — Z6838 Body mass index (BMI) 38.0-38.9, adult: Secondary | ICD-10-CM | POA: Diagnosis not present

## 2014-10-13 DIAGNOSIS — Z7902 Long term (current) use of antithrombotics/antiplatelets: Secondary | ICD-10-CM | POA: Diagnosis not present

## 2014-10-13 DIAGNOSIS — K219 Gastro-esophageal reflux disease without esophagitis: Secondary | ICD-10-CM | POA: Diagnosis present

## 2014-10-13 DIAGNOSIS — E119 Type 2 diabetes mellitus without complications: Secondary | ICD-10-CM | POA: Diagnosis present

## 2014-10-13 DIAGNOSIS — I10 Essential (primary) hypertension: Secondary | ICD-10-CM | POA: Diagnosis present

## 2014-10-13 DIAGNOSIS — I2510 Atherosclerotic heart disease of native coronary artery without angina pectoris: Secondary | ICD-10-CM | POA: Diagnosis present

## 2014-10-13 DIAGNOSIS — I4891 Unspecified atrial fibrillation: Secondary | ICD-10-CM

## 2014-10-13 DIAGNOSIS — R600 Localized edema: Secondary | ICD-10-CM | POA: Diagnosis present

## 2014-10-13 DIAGNOSIS — R0789 Other chest pain: Secondary | ICD-10-CM | POA: Diagnosis present

## 2014-10-13 DIAGNOSIS — Z8249 Family history of ischemic heart disease and other diseases of the circulatory system: Secondary | ICD-10-CM

## 2014-10-13 DIAGNOSIS — E1129 Type 2 diabetes mellitus with other diabetic kidney complication: Secondary | ICD-10-CM

## 2014-10-13 DIAGNOSIS — I480 Paroxysmal atrial fibrillation: Secondary | ICD-10-CM | POA: Diagnosis present

## 2014-10-13 LAB — CBC WITH DIFFERENTIAL/PLATELET
BASOS ABS: 0.1 10*3/uL (ref 0–0.1)
Basophils Relative: 1 %
EOS PCT: 1 %
Eosinophils Absolute: 0.1 10*3/uL (ref 0–0.7)
HCT: 43.9 % (ref 40.0–52.0)
Hemoglobin: 14.3 g/dL (ref 13.0–18.0)
LYMPHS ABS: 0.7 10*3/uL — AB (ref 1.0–3.6)
LYMPHS PCT: 12 %
MCH: 27.2 pg (ref 26.0–34.0)
MCHC: 32.6 g/dL (ref 32.0–36.0)
MCV: 83.3 fL (ref 80.0–100.0)
MONO ABS: 0.5 10*3/uL (ref 0.2–1.0)
Monocytes Relative: 9 %
Neutro Abs: 4.1 10*3/uL (ref 1.4–6.5)
Neutrophils Relative %: 77 %
Platelets: 146 10*3/uL — ABNORMAL LOW (ref 150–440)
RBC: 5.27 MIL/uL (ref 4.40–5.90)
RDW: 16.1 % — AB (ref 11.5–14.5)
WBC: 5.4 10*3/uL (ref 3.8–10.6)

## 2014-10-13 LAB — APTT
aPTT: 37 seconds — ABNORMAL HIGH (ref 24–36)
aPTT: 37 seconds — ABNORMAL HIGH (ref 24–36)
aPTT: 54 seconds — ABNORMAL HIGH (ref 24–36)

## 2014-10-13 LAB — COMPREHENSIVE METABOLIC PANEL
ALBUMIN: 4 g/dL (ref 3.5–5.0)
ALT: 10 U/L — ABNORMAL LOW (ref 17–63)
AST: 18 U/L (ref 15–41)
Alkaline Phosphatase: 155 U/L — ABNORMAL HIGH (ref 38–126)
Anion gap: 8 (ref 5–15)
BILIRUBIN TOTAL: 1.1 mg/dL (ref 0.3–1.2)
BUN: 15 mg/dL (ref 6–20)
CHLORIDE: 102 mmol/L (ref 101–111)
CO2: 31 mmol/L (ref 22–32)
Calcium: 8.7 mg/dL — ABNORMAL LOW (ref 8.9–10.3)
Creatinine, Ser: 1.07 mg/dL (ref 0.61–1.24)
GFR calc Af Amer: >60 mL/min (ref >60)
Glucose, Bld: 128 mg/dL — ABNORMAL HIGH (ref 65–99)
Potassium: 3.1 mmol/L — ABNORMAL LOW (ref 3.5–5.1)
Sodium: 141 mmol/L (ref 135–145)
Total Protein: 7.2 g/dL (ref 6.5–8.1)

## 2014-10-13 LAB — GLUCOSE, CAPILLARY
GLUCOSE-CAPILLARY: 115 mg/dL — AB (ref 65–99)
GLUCOSE-CAPILLARY: 128 mg/dL — AB (ref 65–99)
Glucose-Capillary: 128 mg/dL — ABNORMAL HIGH (ref 65–99)
Glucose-Capillary: 209 mg/dL — ABNORMAL HIGH (ref 65–99)

## 2014-10-13 LAB — HEPARIN LEVEL (UNFRACTIONATED): HEPARIN UNFRACTIONATED: 1.45 [IU]/mL — AB (ref 0.30–0.70)

## 2014-10-13 LAB — TROPONIN I
TROPONIN I: 0.04 ng/mL — AB (ref <0.031)
TROPONIN I: 0.03 ng/mL (ref <0.031)
Troponin I: 0.04 ng/mL — ABNORMAL HIGH (ref <0.031)
Troponin I: 0.03 ng/mL (ref <0.031)

## 2014-10-13 LAB — TSH: TSH: 1.176 u[IU]/mL (ref 0.350–4.500)

## 2014-10-13 LAB — PROTIME-INR
INR: 1.61
PROTHROMBIN TIME: 19.3 s — AB (ref 11.4–15.0)

## 2014-10-13 LAB — BRAIN NATRIURETIC PEPTIDE: B Natriuretic Peptide: 336 pg/mL — ABNORMAL HIGH (ref 0.0–100.0)

## 2014-10-13 MED ORDER — HEPARIN SODIUM (PORCINE) 5000 UNIT/ML IJ SOLN
INTRAMUSCULAR | Status: AC
  Filled 2014-10-13: qty 1

## 2014-10-13 MED ORDER — HEPARIN BOLUS VIA INFUSION
3000.0000 [IU] | Freq: Once | INTRAVENOUS | Status: AC
  Administered 2014-10-13: 3000 [IU] via INTRAVENOUS
  Filled 2014-10-13: qty 3000

## 2014-10-13 MED ORDER — ONDANSETRON HCL 4 MG/2ML IJ SOLN: 4.0000 mg | Freq: Four times a day (QID) | INTRAMUSCULAR | Status: DC | PRN

## 2014-10-13 MED ORDER — POTASSIUM CHLORIDE CRYS ER 20 MEQ PO TBCR
40.0000 meq | EXTENDED_RELEASE_TABLET | Freq: Once | ORAL | Status: AC
  Administered 2014-10-13: 40 meq via ORAL
  Filled 2014-10-13: qty 2

## 2014-10-13 MED ORDER — ACETAMINOPHEN 325 MG PO TABS: 650.0000 mg | ORAL_TABLET | Freq: Four times a day (QID) | ORAL | Status: DC | PRN

## 2014-10-13 MED ORDER — LISINOPRIL 20 MG PO TABS
40.0000 mg | ORAL_TABLET | Freq: Every day | ORAL | Status: DC
  Administered 2014-10-13 – 2014-10-14 (×2): 40 mg via ORAL
  Filled 2014-10-13 (×2): qty 2

## 2014-10-13 MED ORDER — HEPARIN SODIUM (PORCINE) 5000 UNIT/ML IJ SOLN: 5000.0000 [IU] | Freq: Once | INTRAMUSCULAR | Status: DC

## 2014-10-13 MED ORDER — ONDANSETRON HCL 4 MG PO TABS: 4.0000 mg | ORAL_TABLET | Freq: Four times a day (QID) | ORAL | Status: DC | PRN

## 2014-10-13 MED ORDER — BUDESONIDE 0.25 MG/2ML IN SUSP
0.2500 mg | Freq: Two times a day (BID) | RESPIRATORY_TRACT | Status: DC
  Filled 2014-10-13: qty 2

## 2014-10-13 MED ORDER — ALBUTEROL SULFATE (2.5 MG/3ML) 0.083% IN NEBU
2.5000 mg | INHALATION_SOLUTION | Freq: Four times a day (QID) | RESPIRATORY_TRACT | Status: DC | PRN
Start: 2014-10-13 — End: 2014-10-14

## 2014-10-13 MED ORDER — ASPIRIN EC 81 MG PO TBEC
81.0000 mg | DELAYED_RELEASE_TABLET | Freq: Every day | ORAL | Status: DC
  Administered 2014-10-14: 81 mg via ORAL
  Filled 2014-10-13: qty 1

## 2014-10-13 MED ORDER — BECLOMETHASONE DIPROPIONATE 80 MCG/ACT IN AERS: 2.0000 | INHALATION_SPRAY | Freq: Two times a day (BID) | RESPIRATORY_TRACT | Status: DC

## 2014-10-13 MED ORDER — FLUTICASONE PROPIONATE 50 MCG/ACT NA SUSP
1.0000 | Freq: Every day | NASAL | Status: DC
  Administered 2014-10-14: 1 via NASAL
  Filled 2014-10-13: qty 16

## 2014-10-13 MED ORDER — SODIUM CHLORIDE 0.9 % IJ SOLN
3.0000 mL | Freq: Two times a day (BID) | INTRAMUSCULAR | Status: DC
  Administered 2014-10-13: 3 mL via INTRAVENOUS

## 2014-10-13 MED ORDER — TACROLIMUS 1 MG PO CAPS
1.0000 mg | ORAL_CAPSULE | Freq: Two times a day (BID) | ORAL | Status: DC
  Administered 2014-10-13 – 2014-10-14 (×3): 1 mg via ORAL
  Filled 2014-10-13 (×4): qty 1

## 2014-10-13 MED ORDER — APIXABAN 5 MG PO TABS: 5.0000 mg | ORAL_TABLET | Freq: Two times a day (BID) | ORAL | Status: DC

## 2014-10-13 MED ORDER — HEPARIN (PORCINE) IN NACL 100-0.45 UNIT/ML-% IJ SOLN
1600.0000 [IU]/h | INTRAMUSCULAR | Status: DC
  Administered 2014-10-13: 1300 [IU]/h via INTRAVENOUS
  Filled 2014-10-13: qty 250

## 2014-10-13 MED ORDER — ADULT MULTIVITAMIN W/MINERALS CH
1.0000 | ORAL_TABLET | Freq: Every day | ORAL | Status: DC
  Administered 2014-10-13 – 2014-10-14 (×2): 1 via ORAL
  Filled 2014-10-13 (×2): qty 1

## 2014-10-13 MED ORDER — HYDROCODONE-ACETAMINOPHEN 5-325 MG PO TABS: 1.0000 | ORAL_TABLET | Freq: Four times a day (QID) | ORAL | Status: DC | PRN

## 2014-10-13 MED ORDER — SODIUM CHLORIDE 0.9 % IJ SOLN: 3.0000 mL | INTRAMUSCULAR | Status: DC | PRN

## 2014-10-13 MED ORDER — HEPARIN (PORCINE) IN NACL 100-0.45 UNIT/ML-% IJ SOLN: 16.0000 [IU]/kg/h | Freq: Once | INTRAMUSCULAR | Status: DC

## 2014-10-13 MED ORDER — SODIUM CHLORIDE 0.9 % IJ SOLN
3.0000 mL | Freq: Two times a day (BID) | INTRAMUSCULAR | Status: DC
Start: 2014-10-13 — End: 2014-10-14
  Administered 2014-10-13 (×2): 3 mL via INTRAVENOUS

## 2014-10-13 MED ORDER — BUPROPION HCL 75 MG PO TABS
75.0000 mg | ORAL_TABLET | Freq: Two times a day (BID) | ORAL | Status: DC
  Administered 2014-10-13 – 2014-10-14 (×2): 75 mg via ORAL
  Filled 2014-10-13 (×4): qty 1

## 2014-10-13 MED ORDER — INSULIN ASPART 100 UNIT/ML ~~LOC~~ SOLN
0.0000 [IU] | Freq: Three times a day (TID) | SUBCUTANEOUS | Status: DC
Start: 2014-10-13 — End: 2014-10-14
  Administered 2014-10-13: 2 [IU] via SUBCUTANEOUS
  Administered 2014-10-13: 5 [IU] via SUBCUTANEOUS
  Filled 2014-10-13: qty 2
  Filled 2014-10-13: qty 5

## 2014-10-13 MED ORDER — NITROGLYCERIN 0.4 MG SL SUBL
0.4000 mg | SUBLINGUAL_TABLET | SUBLINGUAL | Status: DC | PRN
  Administered 2014-10-13: 0.4 mg via SUBLINGUAL
  Filled 2014-10-13: qty 1

## 2014-10-13 MED ORDER — ALBUTEROL SULFATE HFA 108 (90 BASE) MCG/ACT IN AERS: 2.0000 | INHALATION_SPRAY | Freq: Four times a day (QID) | RESPIRATORY_TRACT | Status: DC | PRN

## 2014-10-13 MED ORDER — POTASSIUM CHLORIDE CRYS ER 20 MEQ PO TBCR
60.0000 meq | EXTENDED_RELEASE_TABLET | Freq: Two times a day (BID) | ORAL | Status: DC
  Administered 2014-10-13 – 2014-10-14 (×3): 60 meq via ORAL
  Filled 2014-10-13 (×3): qty 3

## 2014-10-13 MED ORDER — BUDESONIDE 0.5 MG/2ML IN SUSP
0.5000 mg | Freq: Two times a day (BID) | RESPIRATORY_TRACT | Status: DC
Start: 2014-10-13 — End: 2014-10-14
  Filled 2014-10-13 (×2): qty 2

## 2014-10-13 MED ORDER — FUROSEMIDE 40 MG PO TABS
40.0000 mg | ORAL_TABLET | Freq: Every day | ORAL | Status: DC
  Administered 2014-10-13 – 2014-10-14 (×2): 40 mg via ORAL
  Filled 2014-10-13 (×2): qty 1

## 2014-10-13 MED ORDER — PANTOPRAZOLE SODIUM 40 MG PO TBEC
40.0000 mg | DELAYED_RELEASE_TABLET | Freq: Every day | ORAL | Status: DC
  Administered 2014-10-13 – 2014-10-14 (×2): 40 mg via ORAL
  Filled 2014-10-13 (×2): qty 1

## 2014-10-13 MED ORDER — INSULIN GLARGINE 100 UNIT/ML ~~LOC~~ SOLN
7.0000 [IU] | Freq: Every day | SUBCUTANEOUS | Status: DC
  Administered 2014-10-13: 7 [IU] via SUBCUTANEOUS
  Filled 2014-10-13 (×2): qty 0.07

## 2014-10-13 MED ORDER — ACETAMINOPHEN 650 MG RE SUPP: 650.0000 mg | Freq: Four times a day (QID) | RECTAL | Status: DC | PRN

## 2014-10-13 MED ORDER — CARVEDILOL 3.125 MG PO TABS: 3.1250 mg | ORAL_TABLET | Freq: Two times a day (BID) | ORAL | Status: DC

## 2014-10-13 MED ORDER — ASPIRIN 81 MG PO TBEC: 81.0000 mg | DELAYED_RELEASE_TABLET | Freq: Every day | ORAL | Status: DC

## 2014-10-13 MED ORDER — MYCOPHENOLATE SODIUM 180 MG PO TBEC
720.0000 mg | DELAYED_RELEASE_TABLET | Freq: Two times a day (BID) | ORAL | Status: DC
  Administered 2014-10-13 – 2014-10-14 (×3): 720 mg via ORAL
  Filled 2014-10-13 (×5): qty 4

## 2014-10-13 MED ORDER — SODIUM CHLORIDE 0.9 % IV SOLN: 250.0000 mL | INTRAVENOUS | Status: DC | PRN

## 2014-10-13 MED ORDER — HEPARIN (PORCINE) IN NACL 100-0.45 UNIT/ML-% IJ SOLN
1300.0000 [IU]/h | INTRAMUSCULAR | Status: DC
  Administered 2014-10-13: 1300 [IU]/h via INTRAVENOUS
  Filled 2014-10-13: qty 250

## 2014-10-13 NOTE — Progress Notes (Signed)
ANTICOAGULATION CONSULT NOTE - Initial Consult  Pharmacy Consult for Heparin Indication: chest pain/ACS  Allergies  Allergen Reactions  . Morphine     MORPHINE, puritis    Patient Measurements: Height: _0  (180.3 cm) Weight: 275 lb (124.739 kg) IBW/kg (Calculated) : 75.3 Heparin Dosing Weight: 103.3 kg  Vital Signs: Temp: 98.4 F (36.9 C) (07/21 0750) Temp Source: Oral (07/21 0750) BP: 156/70 mmHg (07/21 1157) Pulse Rate: 46 (07/21 1157)  Labs:  Recent Labs  10/13/14 0350 10/13/14 0536 10/13/14 0947  HGB 14.3  --   --   HCT 43.9  --   --   PLT 146*  --   --   LABPROT  --  19.3*  --   INR  --  1.61  --   CREATININE 1.07  --   --   TROPONINI 0.04*  --  0.04*    Estimated Creatinine Clearance: 104.9 mL/min (by C-G formula based on Cr of 1.07).   Medical History: Past Medical History  Diagnosis Date  . Hypertension   . Hyperlipidemia     low since renal failure  . Kidney transplant status, cadaveric 2012  . Depression   . GERD (gastroesophageal reflux disease)   . Diabetes mellitus   . Gout   . Asthma   . ESRD (end stage renal disease) 2005    Dialysis then renal transplant  . CAD (coronary artery disease) 2005    had CABG  . CHF (congestive heart failure)   . Pneumonia   . Wears glasses   . Sleep apnea     bipap with oxygen 2 l  . Atrial fibrillation     Medications:  Prescriptions prior to admission  Medication Sig Dispense Refill Last Dose  . albuterol (PROAIR HFA) 108 (90 BASE) MCG/ACT inhaler Inhale 2 puffs into the lungs 4 (four) times daily as needed for wheezing or shortness of breath (cough). 1 Inhaler 3 Taking  . apixaban (ELIQUIS) 5 MG TABS tablet Take 1 tablet (5 mg total) by mouth 2 (two) times daily. 60 tablet 6 Taking  . beclomethasone (QVAR) 80 MCG/ACT inhaler Inhale 2 puffs into the lungs 2 (two) times daily. 3 Inhaler 3 Taking  . buPROPion (WELLBUTRIN) 75 MG tablet Take 75 mg by mouth 2 (two) times daily.   Taking  .  carvedilol (COREG) 3.125 MG tablet Take 1 tablet (3.125 mg total) by mouth 2 (two) times daily. 60 tablet 6 Taking  . CIALIS 20 MG tablet Take 1/2 to 1 tablet every other day as needed for erectile dysfunction. 6 tablet 11 Taking  . colchicine 0.6 MG tablet Take 1 tablet (0.6 mg total) by mouth 2 (two) times daily as needed. (Patient taking differently: Take 0.6 mg by mouth 2 (two) times daily as needed. Has not required recently) 60 tablet 2 Taking  . furosemide (LASIX) 40 MG tablet Take 80 mg by mouth as needed.    Taking  . HYDROcodone-acetaminophen (NORCO/VICODIN) 5-325 MG per tablet Take 1-2 tablets by mouth every 6 (six) hours as needed for moderate pain. (Patient not taking: Reported on 09/23/2014) 6 tablet 0 Not Taking  . insulin glargine (LANTUS) 100 UNIT/ML injection Inject 7 Units into the skin at bedtime.    Taking  . lisinopril (PRINIVIL,ZESTRIL) 40 MG tablet Take 1 tablet (40 mg total) by mouth daily. 30 tablet 6 Taking  . metolazone (ZAROXOLYN) 2.5 MG tablet Take 2.5 mg by mouth daily as needed (for excess fluid).    Taking  .  Multiple Vitamin (MULTIVITAMIN WITH MINERALS) TABS tablet Take 1 tablet by mouth daily.   Taking  . mycophenolate (MYFORTIC) 180 MG EC tablet Take 720 mg by mouth 2 (two) times daily.    Taking  . NASONEX 50 MCG/ACT nasal spray USE 2 SPRAYS IN EACH NOSTRIL DAILY 17 g 11 Taking  . omeprazole (PRILOSEC) 20 MG capsule TAKE 1 CAPSULE BY MOUTH DAILY. 30 capsule 5 Taking  . potassium chloride SA (K-DUR,KLOR-CON) 20 MEQ tablet Take 60 mEq by mouth 2 (two) times daily.    Not Taking  . tacrolimus (PROGRAF) 1 MG capsule Take 1 mg by mouth 2 (two) times daily.    Taking    Assessment: Patient previously started on heparin at 1300 units/hr at 7am, orderes d/c'd and then restarted by cardiologist. Per RN, heparin has continued at 1300 units/hr since initial order this morning.   Pt appears to have been on apixaban 5 mg PO BID at home.  Assume last dose taken the evening of  7/20, unclear.   Goal of Therapy:  Heparin level 0.3-0.7 units/ml Monitor platelets by anticoagulation protocol: Yes   Plan:  No bolus given, patient started on heparin 1300 units/hr at ~ 7am.  Will add baseline heparin level and APTT on to AM labs prior to heparin administration. Expect baseline HL to be elevated with rivaroxaban use.  Recheck APTT and HL 6 hours after drip start. Will dose based on APTT until the two values correlate.   Rexene Edison, PharmD Clinical Pharmacist  10/13/2014,1:12 PM

## 2014-10-13 NOTE — Consult Note (Signed)
CARDIOLOGY CONSULT NOTE  Patient ID: [name] MRN: [id number] DOB/AGE: 55-Jun-1962 55 y.o.  Admit date: 10/13/2014 Referring Physician Dr. Reece Levy Primary Physician  Dr. Silvio Pate Primary Cardiologist [name] Reason for Consultation Chest pain.   HPI: This is a pleasant 54 year old male who is well-known to me. He has known history of coronary artery disease status post CABG in 2004 with no recurrent ischemic events since then, chronic systolic heart failure with ejection fraction of 35-40%, type 2 diabetes, end-stage renal disease previously on hemodialysis from 2004 to 2012 when he got back kidney transplant, hypertension, hyperlipidemia, atrial fibrillation and sleep apnea. Previous nuclear stress test in February 2014 showed no evidence of ischemia with ejection fraction of 31%. He underwent gastric bypass in September 2015. He was most recently seen by me in April. He was noted to be in atrial fibrillation with slow ventricular response. I decreased the dose of carvedilol to 3.125 mg twice daily and started him on anticoagulation with Eliquis.  He went on a trip recently to [place] and was very active with walking and hiking. He had no exertional symptoms during that time. He woke up this morning with substernal chest tightness which lasted for about 45 minutes. He did not have nitroglycerin at home. This was associated with shortness of breath. He decided to come to the emergency room. His first troponin was 0.04. He was noted to be bradycardic with heart rates in the 40s. He is currently chest pain-free. Last dose of Eliquis Was last night. He denies syncope or presyncope.  Review of systems complete and found to be negative unless listed above   Past Medical History  Diagnosis Date  . Hypertension   . Hyperlipidemia     low since renal failure  . Kidney transplant status, cadaveric 2012  . Depression   . GERD (gastroesophageal reflux disease)   . Diabetes mellitus   . Gout     . Asthma   . ESRD (end stage renal disease) 2005    Dialysis then renal transplant  . CAD (coronary artery disease) 2005    had CABG  . CHF (congestive heart failure)   . Pneumonia   . Wears glasses   . Sleep apnea     bipap with oxygen 2 l  . Atrial fibrillation     Family History  Problem Relation Age of Onset  . Heart disease Father   . Diabetes Maternal Grandmother   . Breast cancer Maternal Grandmother   . Liver cancer Paternal Uncle   . Liver cancer Paternal Grandmother     History   Social History  . Marital Status: Married    Spouse Name: N/A  . Number of Children: 2  . Years of Education: N/A   Occupational History  . 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 Smoking status: Former Smoker    Types: Cigars    Quit date: 09/02/1994  . Smokeless tobacco: Never Used     Comment: cigar- once in a while  . Alcohol Use: 0.6 oz/week    1 Standard drinks or equivalent per week     Comment: occasional  . Drug Use: No  . Sexual Activity: Not on file   Other Topics Concern  . Not on file   Social History Narrative   In school for culinary arts--- will finish 12/16      Has living will   Wife is health care  POA   Would accept resuscitation attempts   Hasn't considered tube feedings    Past Surgical History  Procedure Laterality Date  . Kidney transplant  09/13/2010    cadaver--[facility]  . Tympanic membrane repair  1/12    left  . Dg angio av shunt*l*      right and left upper arms  . Fasciotomy  03/03/2012    Procedure: FASCIOTOMY;  Surgeon: [name], MD;  Location: [facility];  Service: Orthopedics;  Laterality: Right;  FASCIOTOMY RIGHT SMALL FINGER  . Cardiac catheterization      [facility]  . Coronary artery bypass graft  2005    X 4  . Colonoscopy with propofol N/A 04/22/2013    Procedure: COLONOSCOPY WITH PROPOFOL;  Surgeon: [name], MD;  Location: [facility];  Service:  Endoscopy;  Laterality: N/A;  . Fasciotomy Left 08/17/2013    Procedure: FASCIOTOMY LEFT RING;  Surgeon: [name], MD;  Location: [facility];  Service: Orthopedics;  Laterality: Left;  . Bariatric surgery       Prescriptions prior to admission  Medication Sig Dispense Refill Last Dose  . albuterol (PROAIR HFA) 108 (90 BASE) MCG/ACT inhaler Inhale 2 puffs into the lungs 4 (four) times daily as needed for wheezing or shortness of breath (cough). 1 Inhaler 3 Taking  . apixaban (ELIQUIS) 5 MG TABS tablet Take 1 tablet (5 mg total) by mouth 2 (two) times daily. 60 tablet 6 Taking  . beclomethasone (QVAR) 80 MCG/ACT inhaler Inhale 2 puffs into the lungs 2 (two) times daily. 3 Inhaler 3 Taking  . buPROPion (WELLBUTRIN) 75 MG tablet Take 75 mg by mouth 2 (two) times daily.   Taking  . carvedilol (COREG) 3.125 MG tablet Take 1 tablet (3.125 mg total) by mouth 2 (two) times daily. 60 tablet 6 Taking  . CIALIS 20 MG tablet Take 1/2 to 1 tablet every other day as needed for erectile dysfunction. 6 tablet 11 Taking  . colchicine 0.6 MG tablet Take 1 tablet (0.6 mg total) by mouth 2 (two) times daily as needed. (Patient taking differently: Take 0.6 mg by mouth 2 (two) times daily as needed. Has not required recently) 60 tablet 2 Taking  . furosemide (LASIX) 40 MG tablet Take 80 mg by mouth as needed.    Taking  . HYDROcodone-acetaminophen (NORCO/VICODIN) 5-325 MG per tablet Take 1-2 tablets by mouth every 6 (six) hours as needed for moderate pain. (Patient not taking: Reported on 09/23/2014) 6 tablet 0 Not Taking  . insulin glargine (LANTUS) 100 UNIT/ML injection Inject 7 Units into the skin at bedtime.    Taking  . lisinopril (PRINIVIL,ZESTRIL) 40 MG tablet Take 1 tablet (40 mg total) by mouth daily. 30 tablet 6 Taking  . metolazone (ZAROXOLYN) 2.5 MG tablet Take 2.5 mg by mouth daily as needed (for excess fluid).    Taking  . Multiple Vitamin (MULTIVITAMIN WITH MINERALS) TABS tablet Take 1  tablet by mouth daily.   Taking  . mycophenolate (MYFORTIC) 180 MG EC tablet Take 720 mg by mouth 2 (two) times daily.    Taking  . NASONEX 50 MCG/ACT nasal spray USE 2 SPRAYS IN EACH NOSTRIL DAILY 17 g 11 Taking  . omeprazole (PRILOSEC) 20 MG capsule TAKE 1 CAPSULE BY MOUTH DAILY. 30 capsule 5 Taking  . potassium chloride SA (K-DUR,KLOR-CON) 20 MEQ tablet Take 60 mEq by mouth 2 (two) times daily.    Not Taking  . tacrolimus (PROGRAF) 1  MG capsule Take 1 mg by mouth 2 (two) times daily.    Taking    Physical Exam: Blood pressure 154/65, pulse 36, temperature 98.4 F (36.9 C), temperature source Oral, resp. rate 16, height _0  (1.803 m), weight 275 lb (124.739 kg), SpO2 95 %.   Constitutional: He is oriented to person, place, and time. He appears well-developed and well-nourished. No distress.  HENT: No nasal discharge.  Head: Normocephalic and atraumatic.  Eyes: Pupils are equal and round.  No discharge. Neck: Normal range of motion. Neck supple. No JVD present. No thyromegaly present.  Cardiovascular: Bradycardic, irregular rhythm, normal heart sounds. Exam reveals no gallop and no friction rub. No murmur heard.  Pulmonary/Chest: Effort normal and breath sounds normal. No stridor. No respiratory distress. He has no wheezes. He has no rales. He exhibits no tenderness.  Abdominal: Soft. Bowel sounds are normal. He exhibits no distension. There is no tenderness. There is no rebound and no guarding.  Musculoskeletal: Normal range of motion. He exhibits no edema and no tenderness.  Neurological: He is alert and oriented to person, place, and time. Coordination normal.  Skin: Skin is warm and dry. No rash noted. He is not diaphoretic. No erythema. No pallor.  Psychiatric: He has a normal mood and affect. His behavior is normal. Judgment and thought content normal.      Labs:   Lab Results  Component Value Date   WBC 5.4 10/13/2014   HGB 14.3 10/13/2014   HCT 43.9 10/13/2014   MCV  83.3 10/13/2014   PLT 146* 10/13/2014    Recent Labs Lab 10/13/14 0350  NA 141  K 3.1*  CL 102  CO2 31  BUN 15  CREATININE 1.07  CALCIUM 8.7*  PROT 7.2  BILITOT 1.1  ALKPHOS 155*  ALT 10*  AST 18  GLUCOSE 128*   Lab Results  Component Value Date   TROPONINI 0.04* 10/13/2014     EKG:  Atrial fibrillation with slow ventricular response. Nonspecific IVCD.  ASSESSMENT AND PLAN:   1. Possible unstable angina: First troponin was borderline. There is a possibility that the chest pain might be triggered by significant bradycardia at night. The patient has been active lately with no anginal symptoms. Continue to cycle cardiac enzymes. Repeat echocardiogram. Further ischemic cardiac evaluation based on the above. The patient might require cardiac catheterization. Hold Eliquis today. Continue unfractionated heparin.  If cardiac catheterization is needed, it will be via the right femoral artery.  2. Atrial fibrillation with slow ventricular response: The patient is having significant bradycardia. I agree with holding carvedilol and continue telemetry to see if further intervention is needed. Check TSH.  3. Hypertension: Hold carvedilol and continue lisinopril.  4. Status post kidney transplant: Renal function is normal but we will have to be cautious with contrast.  Signed: [name] MD, [facility] 10/13/2014, 8:57 AM

## 2014-10-13 NOTE — Progress Notes (Signed)
[name] notified of bradycardia and several 2 sec pauses on telemetry; pt asymptomatic and resting in the bed. Acknowledged, no new orders at this time.

## 2014-10-13 NOTE — Care Management (Signed)
Consult present for CM assessment. Patient admitted with chest pain.  He has extensive medical history including CABG and renal transplant.  There is concern for possible nstemi with initial troponin of .04.  This has not been confirm at present.  He is on a heparin drip.  Cardiology consult pending.  Patient has had a documented 2 sec pause on telementry and a heart rate of 36

## 2014-10-13 NOTE — Progress Notes (Signed)
Called pharmacy regarding heparin drip. Order had been discontinued and then heparin pharmacy consult reordered by [name]. Per [name], continue heparin drip, but pharmacy has not yet put in order to continue drip and the rate at which to continue it. Pharmacy acknowledged and will put in orders. Patient has stayed on heparin drip since it was started, per Dr. Tyrell Antonio request.

## 2014-10-13 NOTE — H&P (Signed)
[facility] [place] NAME: [name]    MR#:  [id number]  DATE OF BIRTH:  01/03/1961  DATE OF ADMISSION:  10/13/2014  PRIMARY CARE PHYSICIAN: [name], MD   REQUESTING/REFERRING PHYSICIAN: Dr. Beather Arbour  CHIEF COMPLAINT:   Chief Complaint  Patient presents with  . Chest Pain    HISTORY OF PRESENT ILLNESS:  [name]  is a 54 y.o. male with a known history of multiple below mentioned problems presents to the emergency room with the complaints of left-sided chest pain that woke him up from sleep. No associated shortness of breath, palpitations, dizziness, loss of consciousness. No nausea, vomiting, diarrhea, abdominal pain, dysuria. No recent fever, cough or illness. In the emergency room patient was evaluated by the ED physician was found to be in chronic atrial fibrillation with slow ventricular rate which is chronic, was given sublingual nitroglycerin following which his chest pain resolved completely and has been chest pain-free since then. Workup revealed potassium of 3.1, troponin 0.04, BNP 336, chest x-ray mild pulmonary vascular congestion, chronic stable cardiomegaly. EKG atrial fibrillation with ventricular rate of 43 bpm. Patient was also noted to have ongoing left lower extremity swelling for the past 2 weeks, DVT study done in the ED-results pending at this time. Patient states he recently returned from a long trip in car. Patient is on chronic Eliquis and claims compliant with all medications. Patient was started on heparin drip by the ED physician and hospitalist service was consulted for further management.  PAST MEDICAL HISTORY:   Past Medical History  Diagnosis Date  . Hypertension   . Hyperlipidemia     low since renal failure  . Kidney transplant status, cadaveric 2012  . Depression   . GERD (gastroesophageal reflux disease)   . Diabetes mellitus   . Gout   . Asthma   . ESRD (end stage renal disease) 2005     Dialysis then renal transplant  . CAD (coronary artery disease) 2005    had CABG  . CHF (congestive heart failure)   . Pneumonia   . Wears glasses   . Sleep apnea     bipap with oxygen 2 l  . Atrial fibrillation     PAST SURGICAL HISTORY:   Past Surgical History  Procedure Laterality Date  . Kidney transplant  09/13/2010    cadaver--at [facility]  . Tympanic membrane repair  1/12    left  . Dg angio av shunt*l*      right and left upper arms  . Fasciotomy  03/03/2012    Procedure: FASCIOTOMY;  Surgeon: [name], MD;  Location: [place];  Service: Orthopedics;  Laterality: Right;  FASCIOTOMY RIGHT SMALL FINGER  . Cardiac catheterization      [facility]  . Coronary artery bypass graft  2005    X 4  . Colonoscopy with propofol N/A 04/22/2013    Procedure: COLONOSCOPY WITH PROPOFOL;  Surgeon: [name], MD;  Location: [facility];  Service: Endoscopy;  Laterality: N/A;  . Fasciotomy Left 08/17/2013    Procedure: FASCIOTOMY LEFT RING;  Surgeon: [name], MD;  Location: [facility];  Service: Orthopedics;  Laterality: Left;  . Bariatric surgery      SOCIAL HISTORY:   History  Substance Use Topics  . Smoking status: Former Smoker    Types: Cigars    Quit date: 09/02/1994  . Smokeless tobacco: Never Used     Comment: cigar-  once in a while  . Alcohol Use: 0.6 oz/week    1 Standard drinks or equivalent per week     Comment: occasional    FAMILY HISTORY:   Family History  Problem Relation Age of Onset  . Heart disease Father   . Diabetes Maternal Grandmother   . Breast cancer Maternal Grandmother   . Liver cancer Paternal Uncle   . Liver cancer Paternal Grandmother     DRUG ALLERGIES:   Allergies  Allergen Reactions  . Morphine     MORPHINE, puritis    REVIEW OF SYSTEMS:   Review of Systems  Constitutional: Negative for fever, chills and malaise/fatigue.  HENT: Negative for ear pain, hearing loss, nosebleeds, sore throat  and tinnitus.   Eyes: Negative for blurred vision, double vision, pain, discharge and redness.  Respiratory: Negative for cough, hemoptysis, sputum production, shortness of breath and wheezing.   Cardiovascular: Positive for chest pain and leg swelling. Negative for palpitations and orthopnea.  Gastrointestinal: Negative for nausea, vomiting, abdominal pain, diarrhea, constipation, blood in stool and melena.  Genitourinary: Negative for dysuria, urgency, frequency and hematuria.  Musculoskeletal: Negative for back pain, joint pain and neck pain.  Skin: Negative for itching and rash.  Neurological: Negative for dizziness, tingling, sensory change, focal weakness and seizures.  Endo/Heme/Allergies: Does not bruise/bleed easily.  Psychiatric/Behavioral: Negative for depression. The patient is not nervous/anxious.     MEDICATIONS AT HOME:   Prior to Admission medications   Medication Sig Start Date End Date Taking? Authorizing Provider  albuterol (PROAIR HFA) 108 (90 BASE) MCG/ACT inhaler Inhale 2 puffs into the lungs 4 (four) times daily as needed for wheezing or shortness of breath (cough). 11/30/12   Chesley [name], MD  apixaban (ELIQUIS) 5 MG TABS tablet Take 1 tablet (5 mg total) by mouth 2 (two) times daily. 07/18/14   [name], MD  beclomethasone (QVAR) 80 MCG/ACT inhaler Inhale 2 puffs into the lungs 2 (two) times daily. 10/01/13   Tammy S Parrett, NP  buPROPion (WELLBUTRIN) 75 MG tablet Take 75 mg by mouth 2 (two) times daily.    Historical Provider, MD  carvedilol (COREG) 3.125 MG tablet Take 1 tablet (3.125 mg total) by mouth 2 (two) times daily. 07/18/14   [name], MD  CIALIS 20 MG tablet Take 1/2 to 1 tablet every other day as needed for erectile dysfunction. 05/18/14   [name], MD  colchicine 0.6 MG tablet Take 1 tablet (0.6 mg total) by mouth 2 (two) times daily as needed. Patient taking differently: Take 0.6 mg by mouth 2 (two) times daily as needed. Has not  required recently 07/27/13   [name], MD  furosemide (LASIX) 40 MG tablet Take 80 mg by mouth as needed.  10/13/12   Historical Provider, MD  HYDROcodone-acetaminophen (NORCO/VICODIN) 5-325 MG per tablet Take 1-2 tablets by mouth every 6 (six) hours as needed for moderate pain. Patient not taking: Reported on 09/23/2014 09/02/14   Hollice Espy, MD  insulin glargine (LANTUS) 100 UNIT/ML injection Inject 7 Units into the skin at bedtime.  12/27/11   [name], MD  lisinopril (PRINIVIL,ZESTRIL) 40 MG tablet Take 1 tablet (40 mg total) by mouth daily. 07/18/14   [name], MD  metolazone (ZAROXOLYN) 2.5 MG tablet Take 2.5 mg by mouth daily as needed (for excess fluid).     Historical Provider, MD  Multiple Vitamin (MULTIVITAMIN WITH MINERALS) TABS tablet Take 1 tablet by mouth daily.  Historical Provider, MD  mycophenolate (MYFORTIC) 180 MG EC tablet Take 720 mg by mouth 2 (two) times daily.     Historical Provider, MD  NASONEX 50 MCG/ACT nasal spray USE 2 SPRAYS IN EACH NOSTRIL DAILY 05/24/13   [name], MD  omeprazole (PRILOSEC) 20 MG capsule TAKE 1 CAPSULE BY MOUTH DAILY. 04/18/14   [name], MD  potassium chloride SA (K-DUR,KLOR-CON) 20 MEQ tablet Take 60 mEq by mouth 2 (two) times daily.     Historical Provider, MD  tacrolimus (PROGRAF) 1 MG capsule Take 1 mg by mouth 2 (two) times daily.     Historical Provider, MD      VITAL SIGNS:  Blood pressure 125/64, pulse 44, temperature 97.8 F (36.6 C), temperature source Oral, resp. rate 18, height _0  (1.803 m), weight 124.739 kg (275 lb), SpO2 95 %.  PHYSICAL EXAMINATION:  Physical Exam  Constitutional: He is oriented to person, place, and time. He appears well-developed and well-nourished. No distress.  HENT:  Head: Normocephalic and atraumatic.  Right Ear: External ear normal.  Left Ear: External ear normal.  Nose: Nose normal.  Mouth/Throat: Oropharynx is clear and moist. No oropharyngeal exudate.   Eyes: EOM are normal. Pupils are equal, round, and reactive to light. No scleral icterus.  Neck: Normal range of motion. Neck supple. No JVD present. No thyromegaly present.  Cardiovascular: Normal rate, normal heart sounds and intact distal pulses.  Exam reveals no friction rub.   No murmur heard. Heart rhythm irregular  Respiratory: Effort normal and breath sounds normal. No respiratory distress. He has no wheezes. He has no rales. He exhibits no tenderness.  GI: Soft. Bowel sounds are normal. He exhibits no distension and no mass. There is no tenderness. There is no rebound and no guarding.  Musculoskeletal: Normal range of motion.  Left lower extremity edema +  Lymphadenopathy:    He has no cervical adenopathy.  Neurological: He is alert and oriented to person, place, and time. He has normal reflexes. He displays normal reflexes. No cranial nerve deficit. He exhibits normal muscle tone.  Skin: Skin is warm. No rash noted. No erythema.  Psychiatric: He has a normal mood and affect. His behavior is normal. Thought content normal.   LABORATORY PANEL:   CBC  Recent Labs Lab 10/13/14 0350  WBC 5.4  HGB 14.3  HCT 43.9  PLT 146*   ------------------------------------------------------------------------------------------------------------------  Chemistries   Recent Labs Lab 10/13/14 0350  NA 141  K 3.1*  CL 102  CO2 31  GLUCOSE 128*  BUN 15  CREATININE 1.07  CALCIUM 8.7*  AST 18  ALT 10*  ALKPHOS 155*  BILITOT 1.1   ------------------------------------------------------------------------------------------------------------------  Cardiac Enzymes  Recent Labs Lab 10/13/14 0350  TROPONINI 0.04*   ------------------------------------------------------------------------------------------------------------------  RADIOLOGY:  US Venous Img Lower Unilateral Left  10/13/2014   CLINICAL DATA:  Acute onset of left calf pain.  Initial encounter.  EXAM: LEFT LOWER  EXTREMITY VENOUS DOPPLER ULTRASOUND  TECHNIQUE: Gray-scale sonography with graded compression, as well as color Doppler and duplex ultrasound were performed to evaluate the lower extremity deep venous systems from the level of the common femoral vein and including the common femoral, femoral, profunda femoral, popliteal and calf veins including the posterior tibial, peroneal and gastrocnemius veins when visible. The superficial great saphenous vein was also interrogated. Spectral Doppler was utilized to evaluate flow at rest and with distal augmentation maneuvers in the common femoral, femoral and popliteal veins.  COMPARISON:  None.  FINDINGS: Contralateral Common Femoral Vein: Respiratory phasicity is normal and symmetric with the symptomatic side. No evidence of thrombus. Normal compressibility.  Common Femoral Vein: No evidence of thrombus. Normal compressibility, respiratory phasicity and response to augmentation.  Saphenofemoral Junction: No evidence of thrombus. Normal compressibility and flow on color Doppler imaging.  Profunda Femoral Vein: No evidence of thrombus. Normal compressibility and flow on color Doppler imaging.  Femoral Vein: No evidence of thrombus. Normal compressibility, respiratory phasicity and response to augmentation.  Popliteal Vein: No evidence of thrombus. Normal compressibility, respiratory phasicity and response to augmentation.  Calf Veins: No evidence of thrombus. Normal compressibility and flow on color Doppler imaging. The peroneal vein is not visualized on this study.  Superficial Great Saphenous Vein: No evidence of thrombus. Normal compressibility and flow on color Doppler imaging.  Venous Reflux:  None.  Other Findings:  None.  IMPRESSION: No evidence of deep venous thrombosis.   Electronically Signed   By: Garald Balding M.D.   On: 10/13/2014 06:02   Dg Chest Port 1 View  10/13/2014   CLINICAL DATA:  Initial evaluation for left-sided chest pain, difficulty breathing.   EXAM: PORTABLE CHEST - 1 VIEW  COMPARISON:  Prior radiograph from 05-19-2013  FINDINGS: Pronounced cardiomegaly is similar to previous study. Median sternotomy wires underlying surgical clips and CABG markers again noted, unchanged. Mediastinal silhouette within normal limits.  Lungs are normally inflated. There is central perihilar vascular congestion without overt pulmonary edema. No definite pleural effusion. No focal airspace disease identified. No pneumothorax.  No acute osseus abnormality.  IMPRESSION: 1. Stable cardiomegaly with mild central perihilar vascular congestion without overt pulmonary edema. 2. No other active cardiopulmonary disease.   Electronically Signed   By: Jeannine Boga M.D.   On: 10/13/2014 04:51    EKG:   Orders placed or performed in visit on 07/18/14  . EKG 12-Lead atrial fibrillation with ventricular rate of 43 bpm, left axis deviation     IMPRESSION AND PLAN:   1. Chest pain in a patient with history of coronary artery disease status post CABG, elevated troponin, concerning for non-STEMI. 2. Coronary artery disease status post CABG. 3. Chronic atrial fibrillation with slow ventricular rate which is chronic, patient stable clinically. Patient on Eliquis. Plan: Admit to telemetry, continue heparin drip, low-dose Coreg, aspirin, statin, NTG when necessary, cycle cardiac enzymes. Request echocardiogram and cardiology consultation for further advice. 4. Diabetes mellitus type 2 on Lantus, stable. Continue same along with SSI. 5. End-stage renal disease status post renal transplantation, patient stable. Continue home medications. 6. Hypertension stable on home medications. Continue same. 7 chronic systolic congestive heart failure, stable. Continue home medications. 8. Sleep apnea on BiPAP at home. Stable continue same. 9. Hypokalemia, mild. Replace potassium follow-up BMP. 10. Left lower extremity swelling, recent long distance travel by road. Rule out DVT.  Doppler study results pending. 11. Gout, stable. No acute problems. 12. Hyperlipidemia stable on statin. Continue same    All the records are reviewed and case discussed with ED provider. Management plans discussed with the patient, family and they are in agreement.  CODE STATUS: Full code  TOTAL TIME TAKING CARE OF THIS PATIENT: 50 minutes.    Azucena Freed N M.D on 10/13/2014 at 6:05 AM  Between 7am to 6pm - Pager - [phone]  After 6pm go to [email or web address] - password [email or web address] [facility]  Office  [phone]  CC: Primary care physician; [name], MD

## 2014-10-13 NOTE — Progress Notes (Addendum)
Pt. admitted to uni, [id number]. Oriented to room, call bell, Ascom phones and staff. Bed in low position. Fall safety plan reviewed, contract signed and placed on wall, blue non-skid socks in place. Full assessment to Epic. Will continue to monitor.   Notified pharmacy of need for tech to come complete PTA med list.  Paged Dr. Darvin Neighbours regarding low HR, instructed to hold Coreg but give lisinopril as ordered. Will also keep pt. NPO until cardiology sees him.

## 2014-10-13 NOTE — ED Provider Notes (Signed)
[facility] Emergency Department Provider Note  ____________________________________________  Time seen: Approximately 3:47 AM  I have reviewed the triage vital signs and the nursing notes.   HISTORY  Chief Complaint Chest Pain    HPI [name] is a 54 y.o. male who presents to the ED from home with complaints of chest pain. Patient has a complicated past medical history including hypertension, hyperlipidemia, kidney transplant, diabetes, CAD s/p CABG and congestive heart failure. Patient was recently diagnosed with atrial fibrillation on April visit with his cardiologist, Dr. Theone Stanley. He was noted to be in A. fib with slow ventricular rate 43 at that time. He was started on Eliquis for anticoagulation.His dose of carvedilol was decreased at that time.  Patient awoke from sleep at 2:30 this morning with left-sided chest pressure associated with shortness of breath. Patient denies nausea or diaphoresis. States he has felt more fatigued this week. Denies dyspnea on exertion. Notes left leg swelling for which he has been taking when necessary fluid pills. Patient also recently traveled to the [place] which was a 6 hour car ride.   Past Medical History  Diagnosis Date  . Hypertension   . Hyperlipidemia     low since renal failure  . Kidney transplant status, cadaveric 2012  . Depression   . GERD (gastroesophageal reflux disease)   . Diabetes mellitus   . Gout   . Asthma   . ESRD (end stage renal disease) 2005    Dialysis then renal transplant  . CAD (coronary artery disease) 2005    had CABG  . CHF (congestive heart failure)   . Pneumonia   . Wears glasses   . Sleep apnea     bipap with oxygen 2 l  . Atrial fibrillation     Patient Active Problem List   Diagnosis Date Noted  . BPH with obstruction/lower urinary tract symptoms 09/25/2014  . Hypogonadism in male 09/25/2014  . Diabetes mellitus, type 2 09/09/2014  . Immunosuppressed status  09/09/2014  . Atrial fibrillation 07/18/2014  . Purpura 07/11/2014  . Bariatric surgery status 04/25/2014  . Stasis dermatitis of both legs 01/28/2014  . Chronic systolic heart failure 17/00/1749  . Pre-operative respiratory examination 11/23/2013  . Diabetes 10/27/2013  . Depression 10/04/2013  . Physical deconditioning 05/19/2013  . Alkalosis, metabolic 44/96/7591  . Benign neoplasm of colon 04/22/2013  . Diverticulosis of colon (without mention of hemorrhage) 04/22/2013  . History of renal transplant 03/01/2013  . Asthma, persistent controlled 02/25/2013  . Dysmetabolic syndrome 63/84/6659  . Hypomagnesemia 11/11/2012  . Severe obesity (BMI >= 40) 10/28/2012  . Dyspnea 10/27/2012  . Erectile dysfunction 10/20/2012  . Obstructive sleep apnea 09/29/2012  . Hypoxia 09/24/2012  . Gout 09/13/2012  . Dizziness of unknown cause 09/09/2012  . 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 Obesity hypoventilation syndrome 03/31/2012  . Routine general medical examination at a health care facility 11/12/2011  . Trigger finger 09/25/2011  . Hypophosphatemia 09/10/2011  . Type II or unspecified type diabetes mellitus with renal manifestations, uncontrolled 03/01/2011  . Accumulation of fluid in tissues 02/26/2011  . Disorder of magnesium metabolism 12/17/2010  . Polypharmacy 12/17/2010  . Essential (primary) hypertension 12/17/2010  . Decreased potassium in the blood 12/17/2010  . H/O kidney transplant 12/17/2010  . Extreme obesity 12/17/2010  . CAD, multiple vessel 12/17/2010  . CAD (coronary artery disease)   . Hypertension   . Hyperlipidemia   . ESRD (end stage renal disease)  Past Surgical History  Procedure Laterality Date  . Kidney transplant  09/13/2010    cadaver--at [facility]  . Tympanic membrane repair  1/12    left  . Dg angio av shunt*l*      right and left upper arms  . Fasciotomy  03/03/2012    Procedure: FASCIOTOMY;  Surgeon: [name], MD;  Location: [place];  Service: Orthopedics;  Laterality: Right;  FASCIOTOMY RIGHT SMALL FINGER  . Cardiac catheterization      [facility]  . Coronary artery bypass graft  2005    X 4  . Colonoscopy with propofol N/A 04/22/2013    Procedure: COLONOSCOPY WITH PROPOFOL;  Surgeon: [name], MD;  Location: [facility];  Service: Endoscopy;  Laterality: N/A;  . Fasciotomy Left 08/17/2013    Procedure: FASCIOTOMY LEFT RING;  Surgeon: [name], MD;  Location: [place];  Service: Orthopedics;  Laterality: Left;  . Bariatric surgery      Current Outpatient Rx  Name  Route  Sig  Dispense  Refill  . albuterol (PROAIR HFA) 108 (90 BASE) MCG/ACT inhaler   Inhalation   Inhale 2 puffs into the lungs 4 (four) times daily as needed for wheezing or shortness of breath (cough).   1 Inhaler   3   . apixaban (ELIQUIS) 5 MG TABS tablet   Oral   Take 1 tablet (5 mg total) by mouth 2 (two) times daily.   60 tablet   6   . beclomethasone (QVAR) 80 MCG/ACT inhaler   Inhalation   Inhale 2 puffs into the lungs 2 (two) times daily.   3 Inhaler   3   . buPROPion (WELLBUTRIN) 75 MG tablet   Oral   Take 75 mg by mouth 2 (two) times daily.         . carvedilol (COREG) 3.125 MG tablet   Oral   Take 1 tablet (3.125 mg total) by mouth 2 (two) times daily.   60 tablet   6   . CIALIS 20 MG tablet      Take 1/2 to 1 tablet every other day as needed for erectile dysfunction.   6 tablet   11     Dispense as written.   . colchicine 0.6 MG tablet   Oral   Take 1 tablet (0.6 mg total) by mouth 2 (two) times daily as needed. Patient taking differently: Take 0.6 mg by mouth 2 (two) times daily as needed. Has not required recently   60 tablet   2   . furosemide (LASIX) 40 MG tablet   Oral   Take 80 mg by mouth as needed.          Marland Kitchen HYDROcodone-acetaminophen (NORCO/VICODIN) 5-325 MG per tablet   Oral   Take 1-2 tablets by mouth every 6 (six) hours as  needed for moderate pain. Patient not taking: Reported on 09/23/2014   6 tablet   0   . insulin glargine (LANTUS) 100 UNIT/ML injection   Subcutaneous   Inject 7 Units into the skin at bedtime.          Marland Kitchen lisinopril (PRINIVIL,ZESTRIL) 40 MG tablet   Oral   Take 1 tablet (40 mg total) by mouth daily.   30 tablet   6   . metolazone (ZAROXOLYN) 2.5 MG tablet   Oral   Take 2.5 mg by mouth daily as needed (for excess fluid).          . Multiple  Vitamin (MULTIVITAMIN WITH MINERALS) TABS tablet   Oral   Take 1 tablet by mouth daily.         . mycophenolate (MYFORTIC) 180 MG EC tablet   Oral   Take 720 mg by mouth 2 (two) times daily.          Marland Kitchen NASONEX 50 MCG/ACT nasal spray      USE 2 SPRAYS IN EACH NOSTRIL DAILY   17 g   11   . omeprazole (PRILOSEC) 20 MG capsule      TAKE 1 CAPSULE BY MOUTH DAILY.   30 capsule   5   . potassium chloride SA (K-DUR,KLOR-CON) 20 MEQ tablet   Oral   Take 60 mEq by mouth 2 (two) times daily.          . tacrolimus (PROGRAF) 1 MG capsule   Oral   Take 1 mg by mouth 2 (two) times daily.            Allergies Morphine  Family History  Problem Relation Age of Onset  . Heart disease Father   . Diabetes Maternal Grandmother   . Breast cancer Maternal Grandmother   . Liver cancer Paternal Uncle   . Liver cancer Paternal Grandmother     Social History History  Substance Use Topics  . Smoking status: Former Smoker    Types: Cigars    Quit date: 09/02/1994  . Smokeless tobacco: Never Used     Comment: cigar- once in a while  . Alcohol Use: 0.6 oz/week    1 Standard drinks or equivalent per week     Comment: occasional    Review of Systems Constitutional: No fever/chills Eyes: No visual changes. ENT: No sore throat. Cardiovascular: Positive for chest pain. Respiratory: Positive for shortness of breath. Gastrointestinal: No abdominal pain.  No nausea, no vomiting.  No diarrhea.  No constipation. Genitourinary:  Negative for dysuria. Musculoskeletal: Positive for left leg swelling. Negative for back pain. Skin: Negative for rash. Neurological: Negative for headaches, focal weakness or numbness.  10-point ROS otherwise negative.  ____________________________________________   PHYSICAL EXAM:  VITAL SIGNS: ED Triage Vitals  Enc Vitals Group     BP --      Pulse --      Resp --      Temp --      Temp src --      SpO2 --      Weight --      Height --      Head Cir --      Peak Flow --      Pain Score --      Pain Loc --      Pain Edu? --      Excl. in [place]? --     Constitutional: Alert and oriented. Well appearing and in no acute distress. Eyes: Conjunctivae are normal. PERRL. EOMI. Head: Atraumatic. Nose: No congestion/rhinnorhea. Mouth/Throat: Mucous membranes are moist.  Oropharynx non-erythematous. Neck: No stridor.   Cardiovascular: Bradycardic, irregularly irregular. Grossly normal heart sounds.  Good peripheral circulation. Respiratory: Normal respiratory effort.  No retractions. Lungs CTAB. Gastrointestinal: Soft and nontender. No distention. No abdominal bruits. No CVA tenderness. Musculoskeletal: No lower extremity tenderness. 1+ BLE edema. No Homans sign. No joint effusions. Neurologic:  Normal speech and language. No gross focal neurologic deficits are appreciated. No gait instability. Skin:  Skin is warm, dry and intact. No rash noted. Psychiatric: Mood and affect are normal. Speech and behavior are normal.  ____________________________________________   LABS (all labs ordered are listed, but only abnormal results are displayed)  Labs Reviewed  CBC WITH DIFFERENTIAL/PLATELET - Abnormal; Notable for the following:    RDW 16.1 (*)    Platelets 146 (*)    Lymphs Abs 0.7 (*)    All other components within normal limits  COMPREHENSIVE METABOLIC PANEL - Abnormal; Notable for the following:    Potassium 3.1 (*)    Glucose, Bld 128 (*)    Calcium 8.7 (*)    ALT 10  (*)    Alkaline Phosphatase 155 (*)    All other components within normal limits  BRAIN NATRIURETIC PEPTIDE - Abnormal; Notable for the following:    B Natriuretic Peptide 336.0 (*)    All other components within normal limits  TROPONIN I - Abnormal; Notable for the following:    Troponin I 0.04 (*)    All other components within normal limits   ____________________________________________  EKG  ED ECG REPORT I, Franky Reier J, the attending physician, personally viewed and interpreted this ECG.   Date: 10/13/2014  EKG Time: 0331  Rate: 43  Rhythm: atrial fibrillation, rate 43  Axis: LAD  Intervals:left bundle branch block  ST&T Change: Nonspecific EKG compared to 07/18/2014 without significant change  ____________________________________________  RADIOLOGY  Portable chest x-ray (viewed by me, interpreted by Dr. Jeannine Boga): 1. Stable cardiomegaly with mild central perihilar vascular congestion without overt pulmonary edema. 2. No other active cardiopulmonary disease.  ____________________________________________   PROCEDURES  Procedure(s) performed: None  Critical Care performed: Yes, see critical care note(s)  CRITICAL CARE Performed by: Paulette Blanch   Total critical care time: 30 minutes  Critical care time was exclusive of separately billable procedures and treating other patients.  Critical care was necessary to treat or prevent imminent or life-threatening deterioration.  Critical care was time spent personally by me on the following activities: development of treatment plan with patient and/or surrogate as well as nursing, discussions with consultants, evaluation of patient's response to treatment, examination of patient, obtaining history from patient or surrogate, ordering and performing treatments and interventions, ordering and review of laboratory studies, ordering and review of radiographic studies, pulse oximetry and re-evaluation of patient's  condition.  ____________________________________________   INITIAL IMPRESSION / ASSESSMENT AND PLAN / ED COURSE  Pertinent labs & imaging results that were available during my care of the patient were reviewed by me and considered in my medical decision making (see chart for details).  54 year old male with multiple medical issues presents with chest pain and A. fib with slow ventricular rate on Eliquis. Will start nitroglycerin, check screening lab work including troponin, obtain chest x-ray, obtain Doppler of left lower leg to evaluate for DVT; anticipate hospital admission.  ----------------------------------------- 5:07 AM on 10/13/2014 -----------------------------------------  Patient chest pain-free after nitroglycerin. Updated patient and spouse of laboratory results including hypokalemia and elevated troponin. Will initiate heparin drip. Will discuss with hospitalist for admission.  ____________________________________________   FINAL CLINICAL IMPRESSION(S) / ED DIAGNOSES  Final diagnoses:  Chest pain, unspecified chest pain type  Unstable angina  Hypokalemia  Symptomatic bradycardia  Atrial fibrillation with slow ventricular response      Paulette Blanch, MD 10/13/14 [phone]

## 2014-10-13 NOTE — Progress Notes (Signed)
PTA med list still not complete. Called pharmacy, they are aware and will come finish it.

## 2014-10-13 NOTE — ED Notes (Signed)
hospitalist at bedside

## 2014-10-13 NOTE — ED Notes (Signed)
Dr. Beather Arbour notified of elevated troponin 0.04

## 2014-10-13 NOTE — Progress Notes (Addendum)
ANTICOAGULATION CONSULT NOTE - Initial Consult  Pharmacy Consult for Heparin Indication: chest pain/ACS  Allergies  Allergen Reactions  . Morphine     MORPHINE, puritis    Patient Measurements: Height: _0  (180.3 cm) Weight: 275 lb (124.739 kg) IBW/kg (Calculated) : 75.3 Heparin Dosing Weight: 103.3 kg  Vital Signs: Temp: 97.8 F (36.6 C) (07/21 0356) Temp Source: Oral (07/21 0356) BP: 125/64 mmHg (07/21 0415) Pulse Rate: 44 (07/21 0415)  Labs:  Recent Labs  10/13/14 0350  HGB 14.3  HCT 43.9  PLT 146*  CREATININE 1.07  TROPONINI 0.04*    Estimated Creatinine Clearance: 104.9 mL/min (by C-G formula based on Cr of 1.07).   Medical History: Past Medical History  Diagnosis Date  . Hypertension   . Hyperlipidemia     low since renal failure  . Kidney transplant status, cadaveric 2012  . Depression   . GERD (gastroesophageal reflux disease)   . Diabetes mellitus   . Gout   . Asthma   . ESRD (end stage renal disease) 2005    Dialysis then renal transplant  . CAD (coronary artery disease) 2005    had CABG  . CHF (congestive heart failure)   . Pneumonia   . Wears glasses   . Sleep apnea     bipap with oxygen 2 l  . Atrial fibrillation     Medications:   (Not in a hospital admission)  Assessment: Pt appears to have been on apixaban 5 mg PO BID at home.   Goal of Therapy:  Heparin level 0.3-0.7 units/ml Monitor platelets by anticoagulation protocol: Yes   Plan:  Will not bolus this pt. Start heparin infusion at 1300 units/hr. Infusion started ~06:00. Will order 1st HL 6 hrs after start of drip on 7/21 @ 12:00.  Robbins,Jason D 10/13/2014,5:27 AM

## 2014-10-13 NOTE — ED Notes (Signed)
Patient transported to Ultrasound 

## 2014-10-13 NOTE — Progress Notes (Signed)
*  PRELIMINARY RESULTS* Echocardiogram 2D Echocardiogram has been performed.  [name] 10/13/2014, 9:33 AM

## 2014-10-13 NOTE — Progress Notes (Signed)
[place] for Heparin Indication: chest pain/ACS  Allergies  Allergen Reactions  . Morphine     MORPHINE, puritis    Patient Measurements: Height: _0  (180.3 cm) Weight: 275 lb (124.739 kg) IBW/kg (Calculated) : 75.3 Heparin Dosing Weight: 103.3 kg  Vital Signs: Temp: 98.4 F (36.9 C) (07/21 0750) Temp Source: Oral (07/21 0750) BP: 156/70 mmHg (07/21 1157) Pulse Rate: 46 (07/21 1157)  Labs:  Recent Labs  10/13/14 0350 10/13/14 0536 10/13/14 0947 10/13/14 1352  HGB 14.3  --   --   --   HCT 43.9  --   --   --   PLT 146*  --   --   --   APTT  --  37*  --  37*  LABPROT  --  19.3*  --   --   INR  --  1.61  --   --   HEPARINUNFRC  --   --   --  1.45*  CREATININE 1.07  --   --   --   TROPONINI 0.04*  --  0.04* 0.03    Estimated Creatinine Clearance: 104.9 mL/min (by C-G formula based on Cr of 1.07).   Medical History: Past Medical History  Diagnosis Date  . Hypertension   . Hyperlipidemia     low since renal failure  . Kidney transplant status, cadaveric 2012  . Depression   . GERD (gastroesophageal reflux disease)   . Diabetes mellitus   . Gout   . Asthma   . ESRD (end stage renal disease) 2005    Dialysis then renal transplant  . CAD (coronary artery disease) 2005    had CABG  . CHF (congestive heart failure)   . Pneumonia   . Wears glasses   . Sleep apnea     bipap with oxygen 2 l  . Atrial fibrillation     Medications:  Prescriptions prior to admission  Medication Sig Dispense Refill Last Dose  . albuterol (PROAIR HFA) 108 (90 BASE) MCG/ACT inhaler Inhale 2 puffs into the lungs 4 (four) times daily as needed for wheezing or shortness of breath (cough). 1 Inhaler 3 Taking  . apixaban (ELIQUIS) 5 MG TABS tablet Take 1 tablet (5 mg total) by mouth 2 (two) times daily. 60 tablet 6 Taking  . beclomethasone (QVAR) 80 MCG/ACT inhaler Inhale 2 puffs into the lungs 2 (two) times daily. 3 Inhaler 3 Taking  .  buPROPion (WELLBUTRIN) 75 MG tablet Take 75 mg by mouth 2 (two) times daily.   Taking  . carvedilol (COREG) 3.125 MG tablet Take 1 tablet (3.125 mg total) by mouth 2 (two) times daily. 60 tablet 6 Taking  . CIALIS 20 MG tablet Take 1/2 to 1 tablet every other day as needed for erectile dysfunction. 6 tablet 11 Taking  . colchicine 0.6 MG tablet Take 1 tablet (0.6 mg total) by mouth 2 (two) times daily as needed. (Patient taking differently: Take 0.6 mg by mouth 2 (two) times daily as needed. Has not required recently) 60 tablet 2 Taking  . furosemide (LASIX) 40 MG tablet Take 80 mg by mouth as needed.    Taking  . HYDROcodone-acetaminophen (NORCO/VICODIN) 5-325 MG per tablet Take 1-2 tablets by mouth every 6 (six) hours as needed for moderate pain. (Patient not taking: Reported on 09/23/2014) 6 tablet 0 Not Taking  . insulin glargine (LANTUS) 100 UNIT/ML injection Inject 7 Units into the skin at bedtime.    Taking  . lisinopril (PRINIVIL,ZESTRIL)  40 MG tablet Take 1 tablet (40 mg total) by mouth daily. 30 tablet 6 Taking  . metolazone (ZAROXOLYN) 2.5 MG tablet Take 2.5 mg by mouth daily as needed (for excess fluid).    Taking  . Multiple Vitamin (MULTIVITAMIN WITH MINERALS) TABS tablet Take 1 tablet by mouth daily.   Taking  . mycophenolate (MYFORTIC) 180 MG EC tablet Take 720 mg by mouth 2 (two) times daily.    Taking  . NASONEX 50 MCG/ACT nasal spray USE 2 SPRAYS IN EACH NOSTRIL DAILY 17 g 11 Taking  . omeprazole (PRILOSEC) 20 MG capsule TAKE 1 CAPSULE BY MOUTH DAILY. 30 capsule 5 Taking  . potassium chloride SA (K-DUR,KLOR-CON) 20 MEQ tablet Take 60 mEq by mouth 2 (two) times daily.    Not Taking  . tacrolimus (PROGRAF) 1 MG capsule Take 1 mg by mouth 2 (two) times daily.    Taking    Assessment: Patient previously started on heparin at 1300 units/hr at 7am, orderes d/c'd and then restarted by cardiologist. Per RN, heparin has continued at 1300 units/hr since initial order this morning.   Pt  appears to have been on apixaban 5 mg PO BID at home.  Assume last dose taken the evening of 7/20, unclear.   Goal of Therapy:  Heparin level 0.3-0.7 units/ml APTT 68-109 sec Will dose based on APTT until the two values correlate.     Plan:  No bolus given, patient started on heparin 1300 units/hr at ~ 7am.  Will add baseline heparin level and APTT on to AM labs prior to heparin administration. Expect baseline HL to be elevated with rivaroxaban use. -Not able to add HL to AM labs (too old).   PTT subtherapeutic, HL elevated as expected. Dosing on PTT for now - will give 3000 unit bolus, increase rate to 1400 units/hr and recheck PTT in 6 hrs. Continue to dose with PTT.   HL and CBC ordered for for tomorrow AM.   Rexene Edison, PharmD Clinical Pharmacist  10/13/2014,2:45 PM

## 2014-10-13 NOTE — Discharge Instructions (Signed)
DIET:  °Cardiac diet ° °DISCHARGE CONDITION:  °Stable ° °ACTIVITY:  °Activity as tolerated ° °OXYGEN:  °Home Oxygen: No. °  °Oxygen Delivery: room air ° °DISCHARGE LOCATION:  °home  ° °If you experience worsening of your admission symptoms, develop shortness of breath, life threatening emergency, suicidal or homicidal thoughts you must seek medical attention immediately by calling 911 or calling your MD immediately  if symptoms less severe. ° °You Must read complete instructions/literature along with all the possible adverse reactions/side effects for all the Medicines you take and that have been prescribed to you. Take any new Medicines after you have completely understood and accpet all the possible adverse reactions/side effects.  ° °Please note ° °You were cared for by a hospitalist during your hospital stay. If you have any questions about your discharge medications or the care you received while you were in the hospital after you are discharged, you can call the unit and asked to speak with the hospitalist on call if the hospitalist that took care of you is not available. Once you are discharged, your primary care physician will handle any further medical issues. Please note that NO REFILLS for any discharge medications will be authorized once you are discharged, as it is imperative that you return to your primary care physician (or establish a relationship with a primary care physician if you do not have one) for your aftercare needs so that they can reassess your need for medications and monitor your lab values. ° ° ° ° ° °

## 2014-10-13 NOTE — Progress Notes (Signed)
ANTICOAGULATION CONSULT NOTE - Follow Up Consult  Pharmacy Consult for Heparin Indication: chest pain/ACS  Allergies  Allergen Reactions  . Morphine Itching    Patient Measurements: Height: _0  (180.3 cm) Weight: 276 lb 9.6 oz (125.465 kg) IBW/kg (Calculated) : 75.3 Heparin Dosing Weight: 103.3 kg  Vital Signs: BP: 195/80 mmHg (07/21 1946) Pulse Rate: 139 (07/21 1946)  Labs:  Recent Labs  10/13/14 0350 10/13/14 0536 10/13/14 0947 10/13/14 1352 10/13/14 1954  HGB 14.3  --   --   --   --   HCT 43.9  --   --   --   --   PLT 146*  --   --   --   --   APTT  --  37*  --  37* 54*  LABPROT  --  19.3*  --   --   --   INR  --  1.61  --   --   --   HEPARINUNFRC  --   --   --  1.45*  --   CREATININE 1.07  --   --   --   --   TROPONINI 0.04*  --  0.04* 0.03 0.03    Estimated Creatinine Clearance: 105.3 mL/min (by C-G formula based on Cr of 1.07).   Medications:  Scheduled:  . aspirin EC  81 mg Oral Daily  . budesonide (PULMICORT) nebulizer solution  0.5 mg Nebulization BID  . buPROPion  75 mg Oral BID  . fluticasone  1 spray Each Nare Daily  . furosemide  40 mg Oral Daily  . insulin aspart  0-15 Units Subcutaneous TID WC  . insulin glargine  7 Units Subcutaneous QHS  . lisinopril  40 mg Oral Daily  . multivitamin with minerals  1 tablet Oral Daily  . mycophenolate  720 mg Oral BID  . pantoprazole  40 mg Oral Daily  . potassium chloride SA  60 mEq Oral BID  . sodium chloride  3 mL Intravenous Q12H  . sodium chloride  3 mL Intravenous Q12H  . tacrolimus  1 mg Oral BID   Infusions:  . heparin 1,400 Units/hr (10/13/14 1900)   PRN: sodium chloride, acetaminophen **OR** acetaminophen, albuterol, HYDROcodone-acetaminophen, nitroGLYCERIN, ondansetron **OR** ondansetron (ZOFRAN) IV, sodium chloride  Assessment: Pt appears to have been on apixaban 5 mg PO BID at home. Assume last dose taken the evening of 7/20, unclear. Dosing by aPTT with aPTT below goal.   Goal of  Therapy:  Heparin level 0.3-0.7 units/ml  APTT 68-109 sec Monitor platelets by anticoagulation protocol: Yes   Plan:  Will increase heparin infusion to 1500 units/hr and f/u AM HL/aPTT/CBC.   Ulice Dash D 10/13/2014,10:20 PM

## 2014-10-13 NOTE — ED Notes (Signed)
Patient presents to ED with complaint of left sided chest pain and shortness of breath with exertion and at rest beginning of 2:30 am today. Patient states "the bottom number of my blood pressure ranged 35-41." Patient alert and oriented x 4, skin warm and dry, respirations even and unlabored.

## 2014-10-14 ENCOUNTER — Telehealth: Payer: Self-pay

## 2014-10-14 ENCOUNTER — Inpatient Hospital Stay (HOSPITAL_COMMUNITY)
Admit: 2014-10-14 | Discharge: 2014-10-14 | Disposition: A | Discharge status: Home / Self Care | Payer: 59 | Attending: Cardiovascular Disease | Admitting: Cardiovascular Disease

## 2014-10-14 DIAGNOSIS — I4891 Unspecified atrial fibrillation: Secondary | ICD-10-CM

## 2014-10-14 DIAGNOSIS — R001 Bradycardia, unspecified: Principal | ICD-10-CM

## 2014-10-14 LAB — BASIC METABOLIC PANEL
Anion gap: 8 (ref 5–15)
BUN: 15 mg/dL (ref 6–20)
CALCIUM: 8.7 mg/dL — AB (ref 8.9–10.3)
CHLORIDE: 103 mmol/L (ref 101–111)
CO2: 29 mmol/L (ref 22–32)
Creatinine, Ser: 0.93 mg/dL (ref 0.61–1.24)
GFR calc non Af Amer: >60 mL/min (ref >60)
Glucose, Bld: 113 mg/dL — ABNORMAL HIGH (ref 65–99)
POTASSIUM: 3.6 mmol/L (ref 3.5–5.1)
SODIUM: 140 mmol/L (ref 135–145)

## 2014-10-14 LAB — GLUCOSE, CAPILLARY
GLUCOSE-CAPILLARY: 140 mg/dL — AB (ref 65–99)
Glucose-Capillary: 109 mg/dL — ABNORMAL HIGH (ref 65–99)

## 2014-10-14 LAB — MAGNESIUM: Magnesium: 1.7 mg/dL (ref 1.7–2.4)

## 2014-10-14 LAB — CBC
HCT: 39.6 % — ABNORMAL LOW (ref 40.0–52.0)
HEMOGLOBIN: 13 g/dL (ref 13.0–18.0)
MCH: 27.3 pg (ref 26.0–34.0)
MCHC: 32.9 g/dL (ref 32.0–36.0)
MCV: 83.1 fL (ref 80.0–100.0)
Platelets: 137 10*3/uL — ABNORMAL LOW (ref 150–440)
RBC: 4.76 MIL/uL (ref 4.40–5.90)
RDW: 16.4 % — ABNORMAL HIGH (ref 11.5–14.5)
WBC: 4.5 10*3/uL (ref 3.8–10.6)

## 2014-10-14 LAB — APTT: APTT: 52 s — AB (ref 24–36)

## 2014-10-14 LAB — HEPARIN LEVEL (UNFRACTIONATED): Heparin Unfractionated: 0.75 IU/mL — ABNORMAL HIGH (ref 0.30–0.70)

## 2014-10-14 MED ORDER — AMLODIPINE BESYLATE 5 MG PO TABS
5.0000 mg | ORAL_TABLET | Freq: Every day | ORAL | Status: DC
  Administered 2014-10-14: 5 mg via ORAL
  Filled 2014-10-14: qty 1

## 2014-10-14 MED ORDER — AMLODIPINE BESYLATE 5 MG PO TABS: 5.0000 mg | ORAL_TABLET | Freq: Every day | ORAL | Status: DC

## 2014-10-14 NOTE — Progress Notes (Addendum)
SUBJECTIVE: No recurrent chest pain. He walked in hall yesterday and today with no exertional symptoms.    Filed Vitals:   10/13/14 1946 10/14/14 0000 10/14/14 0052 10/14/14 0547  BP: 195/80  158/81 158/77  Pulse: 139  41 60  Temp:    97.9 F (36.6 C)  TempSrc:    Axillary  Resp: 18   18  Height:      Weight:    274 lb 12.8 oz (124.648 kg)  SpO2: 98% 97%  100%    Intake/Output Summary (Last 24 hours) at 10/14/14 0853 Last data filed at 10/14/14 0646  Gross per 24 hour  Intake 543.33 ml  Output   1150 ml  Net -606.67 ml    LABS: Basic Metabolic Panel:  Recent Labs  10/13/14 0350 10/14/14 0415  NA 141 140  K 3.1* 3.6  CL 102 103  CO2 31 29  GLUCOSE 128* 113*  BUN 15 15  CREATININE 1.07 0.93  CALCIUM 8.7* 8.7*  MG  --  1.7   Liver Function Tests:  Recent Labs  10/13/14 0350  AST 18  ALT 10*  ALKPHOS 155*  BILITOT 1.1  PROT 7.2  ALBUMIN 4.0   No results for input(s): LIPASE, AMYLASE in the last 72 hours. CBC:  Recent Labs  10/13/14 0350 10/14/14 0415  WBC 5.4 4.5  NEUTROABS 4.1  --   HGB 14.3 13.0  HCT 43.9 39.6*  MCV 83.3 83.1  PLT 146* 137*   Cardiac Enzymes:  Recent Labs  10/13/14 0947 10/13/14 1352 10/13/14 1954  TROPONINI 0.04* 0.03 0.03   BNP: Invalid input(s): POCBNP D-Dimer: No results for input(s): DDIMER in the last 72 hours. Hemoglobin A1C: No results for input(s): HGBA1C in the last 72 hours. Fasting Lipid Panel: No results for input(s): CHOL, HDL, LDLCALC, TRIG, CHOLHDL, LDLDIRECT in the last 72 hours. Thyroid Function Tests:  Recent Labs  10/13/14 0947  TSH 1.176   Anemia Panel: No results for input(s): VITAMINB12, FOLATE, FERRITIN, TIBC, IRON, RETICCTPCT in the last 72 hours.   PHYSICAL EXAM General: Well developed, well nourished, in no acute distress HEENT:  Normocephalic and atramatic Neck:  No JVD.  Lungs: Clear bilaterally to auscultation and percussion. Heart: irregular, bradycardiac . Normal S1  and S2 without gallops or murmurs.  Abdomen: Bowel sounds are positive, abdomen soft and non-tender  Msk:  Back normal, normal gait. Normal strength and tone for age. Extremities: No clubbing, cyanosis or edema.   Neuro: Alert and oriented X 3. Psych:  Good affect, responds appropriately  TELEMETRY: Reviewed telemetry pt in : a-fib with SVR. Pauses <2 seconds. 7 beat run of V-tach  ASSESSMENT AND PLAN:  1. Possible unstable angina: cardiac enzymes are negative. I suspect that the chest pain might was triggered by significant bradycardia at night. The patient has been active lately with no anginal symptoms. No recurrent symptoms with physical activities.  echocardiogram showed an EF of 35-40% which is unchanged from before.  Can discharge home from a cardiac standpoint with close outpatient follow up.  If recurrent symptoms, I plan on doing cath.   2. Atrial fibrillation with slow ventricular response: TSH was normal. Improved after stopping Coreg. Still bradycardiac but no indication for a PPM. Asymptomatic short run of V-tach. Resume Eliquis.  I will arrange for a 48 hour Holter monitor.   3. Hypertension: Hold carvedilol and continue lisinopril. Add Amlodipine 5 mg once daily.   4. Status post kidney transplant: Renal function is normal.  Patient has an appointment with me on 8/8 at 3 pm.     [name], MD, [facility] 10/14/2014 8:53 AM

## 2014-10-14 NOTE — Progress Notes (Signed)
RN called noticing short run of NSVT on monitor, pt asymptomatic, AAO. Advised close monitoring. Check bmp and mag levels.

## 2014-10-14 NOTE — Progress Notes (Addendum)
ANTICOAGULATION CONSULT NOTE - Follow Up Consult  Pharmacy Consult for Heparin Indication: chest pain/ACS  Allergies  Allergen Reactions  . Morphine Itching    Patient Measurements: Height: _0  (180.3 cm) Weight: 276 lb 9.6 oz (125.465 kg) IBW/kg (Calculated) : 75.3 Heparin Dosing Weight: 103.3 kg  Vital Signs: Temp: 97.9 F (36.6 C) (07/22 0547) BP: 158/77 mmHg (07/22 0547) Pulse Rate: 60 (07/22 0547)  Labs:  Recent Labs  10/13/14 0350  10/13/14 0536 10/13/14 0947 10/13/14 1352 10/13/14 1954 10/14/14 0415  HGB 14.3  --   --   --   --   --  13.0  HCT 43.9  --   --   --   --   --  39.6*  PLT 146*  --   --   --   --   --  137*  APTT  --   < > 37*  --  37* 54* 52*  LABPROT  --   --  19.3*  --   --   --   --   INR  --   --  1.61  --   --   --   --   HEPARINUNFRC  --   --   --   --  1.45*  --  0.75*  CREATININE 1.07  --   --   --   --   --   --   TROPONINI 0.04*  --   --  0.04* 0.03 0.03  --   < > = values in this interval not displayed.  Estimated Creatinine Clearance: 105.3 mL/min (by C-G formula based on Cr of 1.07).   Medications:  Scheduled:  . aspirin EC  81 mg Oral Daily  . budesonide (PULMICORT) nebulizer solution  0.5 mg Nebulization BID  . buPROPion  75 mg Oral BID  . fluticasone  1 spray Each Nare Daily  . furosemide  40 mg Oral Daily  . insulin aspart  0-15 Units Subcutaneous TID WC  . insulin glargine  7 Units Subcutaneous QHS  . lisinopril  40 mg Oral Daily  . multivitamin with minerals  1 tablet Oral Daily  . mycophenolate  720 mg Oral BID  . pantoprazole  40 mg Oral Daily  . potassium chloride SA  60 mEq Oral BID  . sodium chloride  3 mL Intravenous Q12H  . sodium chloride  3 mL Intravenous Q12H  . tacrolimus  1 mg Oral BID   Infusions:  . heparin 1,500 Units/hr (10/13/14 2231)   PRN: sodium chloride, acetaminophen **OR** acetaminophen, albuterol, HYDROcodone-acetaminophen, nitroGLYCERIN, ondansetron **OR** ondansetron (ZOFRAN) IV,  sodium chloride  Assessment: Pt appears to have been on apixaban 5 mg PO BID at home. Assume last dose taken the evening of 7/20, unclear. Dosing by aPTT with aPTT below goal.   Goal of Therapy:  Heparin level 0.3-0.7 units/ml  APTT 68-109 sec Monitor platelets by anticoagulation protocol: Yes   Plan:  Will increase heparin infusion to 1500 units/hr and f/u AM HL/aPTT/CBC.   7/22 :   HL @ 4:30 = 0.75             APTT = 52  Will increase heparin infusion to 1600 units/hr and recheck aPTT 6 hrs after rate change on 7/22 @ 12:00.  Will recheck HL on 7/23 with AM labs.   Zeeva Courser D 10/14/2014,5:53 AM

## 2014-10-14 NOTE — Progress Notes (Signed)
Communicated with Dr.reddy r/t pt's arrhthymias. Informed Dr. Reece Levy that patient has been running bradycardia and had 7 beats of Vtach.  New order for a met B, mag, and to continue to monitor.

## 2014-10-14 NOTE — Plan of Care (Signed)
Problem: Discharge Progression Outcomes Goal: Other Discharge Outcomes/Goals Outcome: Completed/Met Date Met:  10/14/14 Pt is alert and oriented x 4, denies pain, up ad lib independently, good appetite, vital signs stable, consulted with cardiology, pt is d/c to home. Follow up appointments schedulded for patient, Rx faxed to [facility] at patient request. Pt reports understanding of d/c instructions and has no further questions at this time.

## 2014-10-14 NOTE — Telephone Encounter (Signed)
Patient contacted regarding discharge from [facility] on 7/22.  Patient understands to follow up with provider Arida on 8/8 at 3pm at [facility] office. Patient understands discharge instructions? yes Patient understands medications and regiment? yes Patient understands to bring all medications to this visit? yes

## 2014-10-18 NOTE — Discharge Summary (Signed)
[place] [facility] [place] NAME: [name]    MR#:  [id number]  DATE OF BIRTH:  1960/06/21  DATE OF ADMISSION:  10/13/2014 ADMITTING PHYSICIAN: Juluis Mire, MD  DATE OF DISCHARGE: 10/14/2014  1:47 PM  PRIMARY CARE PHYSICIAN: [name], MD    ADMISSION DIAGNOSIS:  Unstable angina [I20.0] Hypokalemia [E87.6] Atrial fibrillation with slow ventricular response [I48.91] Symptomatic bradycardia [R00.1] Chest pain, unspecified chest pain type [R07.9]  DISCHARGE DIAGNOSIS:  Principal Problem:   Chest pain Active Problems:   CAD (coronary artery disease)   Hypertension   DM2 (diabetes mellitus, type 2)   Gout   History of renal transplant   Chronic systolic heart failure   Atrial fibrillation   Hypokalemia   Chronic a-fib   Bradycardia   SECONDARY DIAGNOSIS:   Past Medical History  Diagnosis Date  . Hypertension   . Hyperlipidemia     low since renal failure  . Kidney transplant status, cadaveric 2012  . Depression   . GERD (gastroesophageal reflux disease)   . Diabetes mellitus   . Gout   . Asthma   . ESRD (end stage renal disease) 2005    Dialysis then renal transplant  . CAD (coronary artery disease) 2005    had CABG  . CHF (congestive heart failure)   . Pneumonia   . Wears glasses   . Sleep apnea     bipap with oxygen 2 l  . Atrial fibrillation      ADMITTING HISTORY  [name] is a 54 y.o. male with a known history of multiple below mentioned problems presents to the emergency room with the complaints of left-sided chest pain that woke him up from sleep. No associated shortness of breath, palpitations, dizziness, loss of consciousness. No nausea, vomiting, diarrhea, abdominal pain, dysuria. No recent fever, cough or illness. In the emergency room patient was evaluated by the ED physician was found to be in chronic atrial fibrillation with slow ventricular rate which is chronic, was given sublingual  nitroglycerin following which his chest pain resolved completely and has been chest pain-free since then. Workup revealed potassium of 3.1, troponin 0.04, BNP 336, chest x-ray mild pulmonary vascular congestion, chronic stable cardiomegaly. EKG atrial fibrillation with ventricular rate of 43 bpm. Patient was also noted to have ongoing left lower extremity swelling for the past 2 weeks, DVT study done in the ED-results pending at this time. Patient states he recently returned from a long trip in car. Patient is on chronic Eliquis and claims compliant with all medications. Patient was started on heparin drip by the ED physician and hospitalist service was consulted for further management.   HOSPITAL COURSE:   Patient was admitted onto telemetry floor. 3 sets of troponin and his EKG showed nothing acute. Was seen by cardiology [name]. Patient did have bradycardia which was thought to be the cause for his chest pain. His beta blocker was stopped due to bradycardia. Patient's dizziness resolved. Norvasc was added for better control of blood pressure. Discharged home in a stable condition to follow-up with his cardiologist and primary care physician as outpatient.   CONSULTS OBTAINED:  Treatment Team:  [name], MD  DRUG ALLERGIES:   Allergies  Allergen Reactions  . Morphine Itching    DISCHARGE MEDICATIONS:   Discharge Medication List as of 10/14/2014 11:13 AM    START taking these medications   Details  amLODipine (NORVASC) 5 MG tablet Take 1 tablet (5  mg total) by mouth daily., Starting 10/14/2014, Until Discontinued, Print    aspirin EC 81 MG EC tablet Take 1 tablet (81 mg total) by mouth daily., Starting 10/13/2014, Until Discontinued, OTC      CONTINUE these medications which have NOT CHANGED   Details  albuterol (PROAIR HFA) 108 (90 BASE) MCG/ACT inhaler Inhale 2 puffs into the lungs 4 (four) times daily as needed for wheezing or shortness of breath (cough)., Starting  11/30/2012, Until Discontinued, Normal    apixaban (ELIQUIS) 5 MG TABS tablet Take 1 tablet (5 mg total) by mouth 2 (two) times daily., Starting 07/18/2014, Until Discontinued, Normal    beclomethasone (QVAR) 80 MCG/ACT inhaler Inhale 2 puffs into the lungs 2 (two) times daily., Starting 10/01/2013, Until Discontinued, Normal    buPROPion (WELLBUTRIN) 75 MG tablet Take 75 mg by mouth 2 (two) times daily., Until Discontinued, Historical Med    CIALIS 20 MG tablet Take 1/2 to 1 tablet every other day as needed for erectile dysfunction., Phone In    colchicine 0.6 MG tablet Take 1 tablet (0.6 mg total) by mouth 2 (two) times daily as needed., Starting 07/27/2013, Until Discontinued, Normal    furosemide (LASIX) 40 MG tablet Take 80 mg by mouth as needed for edema. , Until Discontinued, Historical Med    HYDROcodone-acetaminophen (NORCO/VICODIN) 5-325 MG per tablet Take 1-2 tablets by mouth every 6 (six) hours as needed for moderate pain., Starting 09/02/2014, Until Discontinued, Print    insulin glargine (LANTUS) 100 UNIT/ML injection Inject 7 Units into the skin at bedtime., Until Discontinued, Historical Med    lisinopril (PRINIVIL,ZESTRIL) 40 MG tablet Take 1 tablet (40 mg total) by mouth daily., Starting 07/18/2014, Until Discontinued, Normal    metolazone (ZAROXOLYN) 2.5 MG tablet Take 2.5 mg by mouth as needed (for excess fluid). , Until Discontinued, Historical Med    mometasone (NASONEX) 50 MCG/ACT nasal spray Place 2 sprays into the nose at bedtime., Until Discontinued, Historical Med    Multiple Vitamin (MULTIVITAMIN WITH MINERALS) TABS tablet Take 1 tablet by mouth daily., Until Discontinued, Historical Med    mycophenolate (MYFORTIC) 180 MG EC tablet Take 720 mg by mouth 2 (two) times daily. , Until Discontinued, Historical Med    omeprazole (PRILOSEC) 20 MG capsule Take 20 mg by mouth daily., Until Discontinued, Historical Med    potassium chloride SA (K-DUR,KLOR-CON) 20 MEQ tablet  Take 40 mEq by mouth as needed (when taking Furosemide or Metolazone.). , Until Discontinued, Historical Med    !! tacrolimus (PROGRAF) 0.5 MG capsule Take 0.5 mg by mouth every evening., Until Discontinued, Historical Med    !! tacrolimus (PROGRAF) 1 MG capsule Take 1 mg by mouth daily. , Until Discontinued, Historical Med     !! - Potential duplicate medications found. Please discuss with provider.    STOP taking these medications     carvedilol (COREG) 3.125 MG tablet          Today    VITAL SIGNS:  Blood pressure 145/61, pulse 64, temperature 97.5 F (36.4 C), temperature source Oral, resp. rate 18, height _0  (1.803 m), weight 124.648 kg (274 lb 12.8 oz), SpO2 96 %.  I/O:  No intake or output data in the 24 hours ending 10/18/14 1326  PHYSICAL EXAMINATION:  Physical Exam  GENERAL:  54 y.o.-year-old patient lying in the bed with no acute distress.  LUNGS: Normal breath sounds bilaterally, no wheezing, rales,rhonchi or crepitation. No use of accessory muscles of respiration.  CARDIOVASCULAR: S1, S2  normal. No murmurs, rubs, or gallops.  ABDOMEN: Soft, non-tender, non-distended. Bowel sounds present. No organomegaly or mass.  NEUROLOGIC: Moves all 4 extremities. PSYCHIATRIC: The patient is alert and oriented x 3.  SKIN: No obvious rash, lesion, or ulcer.   DATA REVIEW:   CBC  Recent Labs Lab 10/14/14 0415  WBC 4.5  HGB 13.0  HCT 39.6*  PLT 137*    Chemistries   Recent Labs Lab 10/13/14 0350 10/14/14 0415  NA 141 140  K 3.1* 3.6  CL 102 103  CO2 31 29  GLUCOSE 128* 113*  BUN 15 15  CREATININE 1.07 0.93  CALCIUM 8.7* 8.7*  MG  --  1.7  AST 18  --   ALT 10*  --   ALKPHOS 155*  --   BILITOT 1.1  --     Cardiac Enzymes  Recent Labs Lab 10/13/14 1954  TROPONINI 0.03    Microbiology Results  Results for orders placed or performed in visit on 09/23/14  Microscopic Examination     Status: None   Collection Time: 09/23/14  8:55 AM  Result  Value Ref Range Status   WBC, UA 0-5 0 -  5 /hpf Final   RBC, UA 0-2 0 -  2 /hpf Final   Epithelial Cells (non renal) 0-10 0 - 10 /hpf Final   Bacteria, UA None seen None seen/Few Final    RADIOLOGY:  No results found.    Follow up with PCP and cardiology in 1 week.  Management plans discussed with the patient, family and they are in agreement.  CODE STATUS:  Advance Directive Documentation        Most Recent Value   Type of Advance Directive  Healthcare Power of Attorney, Living will   Pre-existing out of facility DNR order (yellow form or pink MOST form)     "MOST" Form in Place?        TOTAL TIME TAKING CARE OF THIS PATIENT ON DAY OF DISCHARGE: more than 30 minutes.    Hillary Bow R M.D on 10/18/2014 at 1:26 PM  Between 7am to 6pm - Pager - [phone]  After 6pm go to [email or web address] - password [email or web address] [facility]  Office  [phone]  CC: Primary care physician; [name], MD

## 2014-10-19 ENCOUNTER — Ambulatory Visit: Payer: Medicare Other | Admitting: Urology

## 2014-10-26 ENCOUNTER — Ambulatory Visit: Payer: 59 | Admitting: Internal Medicine

## 2014-10-28 ENCOUNTER — Other Ambulatory Visit: Payer: Self-pay | Admitting: Internal Medicine

## 2014-10-31 ENCOUNTER — Encounter: Payer: Self-pay | Admitting: Cardiovascular Disease

## 2014-10-31 ENCOUNTER — Ambulatory Visit (INDEPENDENT_AMBULATORY_CARE_PROVIDER_SITE_OTHER): Payer: 59 | Admitting: Cardiovascular Disease

## 2014-10-31 VITALS — BP 154/92 | HR 63 | Ht 71.0 in | Wt 273.2 lb

## 2014-10-31 DIAGNOSIS — I2510 Atherosclerotic heart disease of native coronary artery without angina pectoris: Secondary | ICD-10-CM | POA: Diagnosis not present

## 2014-10-31 DIAGNOSIS — I482 Chronic atrial fibrillation, unspecified: Secondary | ICD-10-CM

## 2014-10-31 DIAGNOSIS — I10 Essential (primary) hypertension: Secondary | ICD-10-CM

## 2014-10-31 DIAGNOSIS — R0789 Other chest pain: Secondary | ICD-10-CM | POA: Diagnosis not present

## 2014-10-31 DIAGNOSIS — I5022 Chronic systolic (congestive) heart failure: Secondary | ICD-10-CM | POA: Diagnosis not present

## 2014-10-31 DIAGNOSIS — I200 Unstable angina: Secondary | ICD-10-CM

## 2014-10-31 NOTE — Assessment & Plan Note (Signed)
Recent episode of chest pain in the setting of severe bradycardia. He reports no further episodes since stopping carvedilol. He continues to be active and has no exertional symptoms. Continue medical therapy. I discontinued  aspirin given that he is on anticoagulation.

## 2014-10-31 NOTE — Assessment & Plan Note (Signed)
Ventricular rate is controlled without any medications. If anything, he had recent symptomatic bradycardia while on small dose carvedilol. This has improved since the medication was discontinued. Continue long-term anticoagulation as he is at high risk for thromboembolic complications. The patient did have nighttime bradycardia and pauses on Holter monitor but did not severe enough to warrant pacemaker placement at the present time. He is at risk for progression in the future given underlying left bundle branch block.

## 2014-10-31 NOTE — Assessment & Plan Note (Signed)
His blood pressure is mildly elevated. Amlodipine caused some worsening leg edema. He does not always adhere to low-sodium diet and this was discussed with him. One option in the future would be to switch him to a small dose thiazide diuretics. For now, I discussed with him the importance of low sodium diet, exercise and continued weight loss to improve his blood pressure control.

## 2014-10-31 NOTE — Patient Instructions (Signed)
Medication Instructions: Stop taking Aspirin.  Continue other medications.  Labwork: None.   Procedures/Testing: None.   Follow-Up: 3 months with [name].   Any Additional Special Instructions Will Be Listed Below (If Applicable).

## 2014-10-31 NOTE — Assessment & Plan Note (Signed)
Most recent ejection fraction was 35-40%. He appears to be euvolemic.

## 2014-10-31 NOTE — Progress Notes (Signed)
HPI  This is a pleasant 54 year-old male who is here today for a follow up visit. He has known history of coronary artery disease status post CABG in 2004 with no recurrent ischemic events since then, chronic systolic heart failure with ejection fraction of 35-40 %, type 2 diabetes, end-stage renal disease previously on hemodialysis from 2004 - 2012 when he got a kidney transplant, hypertension, hyperlipidemia, chronic atrial fibrillation and sleep apnea. He was evaluated by me in February of 2014 when he was admitted to [facility] with atypical chest pain in the setting of bronchitis. He underwent an outpatient nuclear stress test which showed no evidence of ischemia. However, the ejection fraction was reported to be 31%.  Echocardiogram in February of 2015 showed an ejection fraction of 35-40% with grade 2 diastolic dysfunction and significantly dilated left atrium.  She underwent gastric bypass in September 7026 without complications. He lost 50 pounds since then. He was diagnosed with atrial fibrillation few months ago and was started on anticoagulation. He was hospitalized recently at [facility] for chest pain that woke him up from sleep. He was noted to be bradycardic on presentation with heart rate in the 30s. Carvedilol was discontinued. Cardiac enzymes were negative. Amlodipine was added for blood pressure control. Holter monitor upon discharge showed atrial fibrillation with a mean heart rate of 57 bpm with night time bradycardia and pauses up to 2.88 seconds. The patient feels significantly better since hospital discharge. No chest pain or shortness of breath. He did notice worsening leg edema. He uses his CPAP regularly.   Allergies  Allergen Reactions  . Morphine Itching     Current Outpatient Prescriptions on File Prior to Visit  Medication Sig Dispense Refill  . albuterol (PROAIR HFA) 108 (90 BASE) MCG/ACT inhaler Inhale 2 puffs into the lungs 4 (four) times daily as needed for wheezing or  shortness of breath (cough). (Patient taking differently: Inhale 2 puffs into the lungs 4 (four) times daily as needed for wheezing or shortness of breath. ) 1 Inhaler 3  . amLODipine (NORVASC) 5 MG tablet Take 1 tablet (5 mg total) by mouth daily. 30 tablet 0  . apixaban (ELIQUIS) 5 MG TABS tablet Take 1 tablet (5 mg total) by mouth 2 (two) times daily. 60 tablet 6  . aspirin EC 81 MG EC tablet Take 1 tablet (81 mg total) by mouth daily. 30 tablet 0  . beclomethasone (QVAR) 80 MCG/ACT inhaler Inhale 2 puffs into the lungs 2 (two) times daily. (Patient taking differently: Inhale 2 puffs into the lungs 2 (two) times daily as needed (for shortness of breath). ) 3 Inhaler 3  . buPROPion (WELLBUTRIN) 75 MG tablet Take 75 mg by mouth 2 (two) times daily.    Marland Kitchen CIALIS 20 MG tablet Take 1/2 to 1 tablet every other day as needed for erectile dysfunction. (Patient taking differently: Take 10-20 mg by mouth as needed for erectile dysfunction. ) 6 tablet 11  . colchicine 0.6 MG tablet Take 1 tablet (0.6 mg total) by mouth 2 (two) times daily as needed. (Patient taking differently: Take 0.6 mg by mouth 2 (two) times daily as needed (for gout flares). ) 60 tablet 2  . furosemide (LASIX) 40 MG tablet Take 80 mg by mouth as needed for edema.     Marland Kitchen HYDROcodone-acetaminophen (NORCO/VICODIN) 5-325 MG per tablet Take 1-2 tablets by mouth every 6 (six) hours as needed for moderate pain. 6 tablet 0  . insulin glargine (LANTUS) 100 UNIT/ML injection Inject  7 Units into the skin at bedtime.    Marland Kitchen lisinopril (PRINIVIL,ZESTRIL) 40 MG tablet Take 1 tablet (40 mg total) by mouth daily. 30 tablet 6  . metolazone (ZAROXOLYN) 2.5 MG tablet Take 2.5 mg by mouth as needed (for excess fluid).     . mometasone (NASONEX) 50 MCG/ACT nasal spray Place 2 sprays into the nose at bedtime.    . Multiple Vitamin (MULTIVITAMIN WITH MINERALS) TABS tablet Take 1 tablet by mouth daily.    . mycophenolate (MYFORTIC) 180 MG EC tablet Take 720 mg by  mouth 2 (two) times daily.     Marland Kitchen omeprazole (PRILOSEC) 20 MG capsule TAKE 1 CAPSULE BY MOUTH DAILY. 90 capsule 3  . potassium chloride SA (K-DUR,KLOR-CON) 20 MEQ tablet Take 40 mEq by mouth as needed (when taking Furosemide or Metolazone.).     Marland Kitchen tacrolimus (PROGRAF) 0.5 MG capsule Take 0.5 mg by mouth every evening.    . tacrolimus (PROGRAF) 1 MG capsule Take 1 mg by mouth daily.      No current facility-administered medications on file prior to visit.     Past Medical History  Diagnosis Date  . Hypertension   . Hyperlipidemia     low since renal failure  . Kidney transplant status, cadaveric 2012  . Depression   . GERD (gastroesophageal reflux disease)   . Diabetes mellitus   . Gout   . Asthma   . ESRD (end stage renal disease) 2005    Dialysis then renal transplant  . CAD (coronary artery disease) 2005    had CABG  . CHF (congestive heart failure)   . Pneumonia   . Wears glasses   . Sleep apnea     bipap with oxygen 2 l  . Atrial fibrillation      Past Surgical History  Procedure Laterality Date  . Kidney transplant  09/13/2010    cadaver--at [facility]  . Tympanic membrane repair  1/12    left  . Dg angio av shunt*l*      right and left upper arms  . Fasciotomy  03/03/2012    Procedure: FASCIOTOMY;  Surgeon: [name], MD;  Location: [place];  Service: Orthopedics;  Laterality: Right;  FASCIOTOMY RIGHT SMALL FINGER  . Cardiac catheterization      [facility]  . Coronary artery bypass graft  2005    X 4  . Colonoscopy with propofol N/A 04/22/2013    Procedure: COLONOSCOPY WITH PROPOFOL;  Surgeon: [name], MD;  Location: [facility];  Service: Endoscopy;  Laterality: N/A;  . Fasciotomy Left 08/17/2013    Procedure: FASCIOTOMY LEFT RING;  Surgeon: [name], MD;  Location: [place];  Service: Orthopedics;  Laterality: Left;  . Bariatric surgery    . Vasectomy       Family History  Problem Relation Age of Onset  . Heart  disease Father   . Diabetes Maternal Grandmother   . Breast cancer Maternal Grandmother   . Liver cancer Paternal Uncle   . Liver cancer Paternal Grandmother      History   Social History  . Marital Status: Married    Spouse Name: N/A  . Number of Children: 2  . Years of Education: N/A   Occupational History  . 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 Smoking status: Former Smoker    Types: Cigars    Quit date: 09/02/1994  .  Smokeless tobacco: Never Used     Comment: cigar- once in a while  . Alcohol Use: No     Comment: occasional  . Drug Use: No  . Sexual Activity: Not on file   Other Topics Concern  . Not on file   Social History Narrative   In school for culinary arts--- will finish 12/16      Has living will   Wife is health care POA   Would accept resuscitation attempts   Hasn't considered tube feedings      PHYSICAL EXAM   BP 154/92 mmHg  Pulse 63  Ht _0  (1.803 m)  Wt 273 lb 4 oz (123.945 kg)  BMI 38.13 kg/m2 Constitutional: He is oriented to person, place, and time. He appears well-developed and well-nourished. No distress.  HENT: No nasal discharge.  Head: Normocephalic and atraumatic.  Eyes: Pupils are equal and round.  No discharge. Neck: Normal range of motion. Neck supple. No JVD present. No thyromegaly present.  Cardiovascular:Normal rate, irregular rhythm, normal heart sounds. Exam reveals no gallop and no friction rub. No murmur heard.  Pulmonary/Chest: Effort normal and breath sounds normal. No stridor. No respiratory distress. He has no wheezes. He has no rales. He exhibits no tenderness.  Abdominal: Soft. Bowel sounds are normal. He exhibits no distension. There is no tenderness. There is no rebound and no guarding.  Musculoskeletal: Normal range of motion. He exhibits+1  edema and no tenderness.  Neurological: He is alert and oriented to person, place, and time. Coordination normal.    Skin: Skin is warm and dry. No rash noted. He is not diaphoretic. No erythema. No pallor.  Psychiatric: He has a normal mood and affect. His behavior is normal. Judgment and thought content normal.       EKG:  atrial fibrillation  -Left bundle branch block and left axis.   ABNORMAL    ASSESSMENT AND PLAN

## 2014-11-02 ENCOUNTER — Encounter: Payer: Self-pay | Admitting: *Deleted

## 2014-11-02 ENCOUNTER — Ambulatory Visit (INDEPENDENT_AMBULATORY_CARE_PROVIDER_SITE_OTHER): Payer: 59 | Admitting: Internal Medicine

## 2014-11-02 VITALS — BP 160/70 | HR 70 | Temp 98.1°F | Wt 270.0 lb

## 2014-11-02 DIAGNOSIS — I5022 Chronic systolic (congestive) heart failure: Secondary | ICD-10-CM

## 2014-11-02 DIAGNOSIS — I482 Chronic atrial fibrillation, unspecified: Secondary | ICD-10-CM

## 2014-11-02 DIAGNOSIS — I25119 Atherosclerotic heart disease of native coronary artery with unspecified angina pectoris: Secondary | ICD-10-CM

## 2014-11-02 DIAGNOSIS — I200 Unstable angina: Secondary | ICD-10-CM

## 2014-11-02 DIAGNOSIS — F3340 Major depressive disorder, recurrent, in remission, unspecified: Secondary | ICD-10-CM

## 2014-11-02 DIAGNOSIS — Z940 Kidney transplant status: Secondary | ICD-10-CM | POA: Diagnosis not present

## 2014-11-02 DIAGNOSIS — E1121 Type 2 diabetes mellitus with diabetic nephropathy: Secondary | ICD-10-CM | POA: Diagnosis not present

## 2014-11-02 LAB — HM DIABETES FOOT EXAM

## 2014-11-02 MED ORDER — BUPROPION HCL 75 MG PO TABS: 75.0000 mg | ORAL_TABLET | Freq: Two times a day (BID) | ORAL | Status: DC

## 2014-11-02 NOTE — Assessment & Plan Note (Signed)
Follows at [facility] regularly

## 2014-11-02 NOTE — Progress Notes (Signed)
Pre visit review using our clinic review tool, if applicable. No additional management support is needed unless otherwise documented below in the visit note.

## 2014-11-02 NOTE — Assessment & Plan Note (Signed)
Recent angina probably due to bradycardia Resolved and rate better off beta blocker

## 2014-11-02 NOTE — Progress Notes (Signed)
Subjective:    Patient ID: [name], male    DOB: 05-Dec-1960, 54 y.o.   MRN: [id number]  HPI Here for hospital follow up  Hospitalization 2 weeks ago Had chest pain-- found to be bradycardic Off beta blocker--seems to be better Trying to walk nightly--hasn't noticed difference with this  No palpitations Has awoken with SOB in the past--- none since beta blocker off Still uses CPAP  Had bariatric surgery last year Lost 50% Had yearly [facility] check for the kidney transplant and labs done  Sugars are better Fasting 90-110 Postprandial up to 150 Checks three times a week or so No foot sores, pain or numbness  Depression is controlled now Continues on the wellbutrin  Current Outpatient Prescriptions on File Prior to Visit  Medication Sig Dispense Refill  . albuterol (PROAIR HFA) 108 (90 BASE) MCG/ACT inhaler Inhale 2 puffs into the lungs 4 (four) times daily as needed for wheezing or shortness of breath (cough). (Patient taking differently: Inhale 2 puffs into the lungs 4 (four) times daily as needed for wheezing or shortness of breath. ) 1 Inhaler 3  . amLODipine (NORVASC) 5 MG tablet Take 1 tablet (5 mg total) by mouth daily. 30 tablet 0  . apixaban (ELIQUIS) 5 MG TABS tablet Take 1 tablet (5 mg total) by mouth 2 (two) times daily. 60 tablet 6  . beclomethasone (QVAR) 80 MCG/ACT inhaler Inhale 2 puffs into the lungs 2 (two) times daily. (Patient taking differently: Inhale 2 puffs into the lungs 2 (two) times daily as needed (for shortness of breath). ) 3 Inhaler 3  . buPROPion (WELLBUTRIN) 75 MG tablet Take 75 mg by mouth 2 (two) times daily.    Marland Kitchen CIALIS 20 MG tablet Take 1/2 to 1 tablet every other day as needed for erectile dysfunction. (Patient taking differently: Take 10-20 mg by mouth as needed for erectile dysfunction. ) 6 tablet 11  . colchicine 0.6 MG tablet Take 1 tablet (0.6 mg total) by mouth 2 (two) times daily as needed. (Patient taking differently: Take 0.6  mg by mouth 2 (two) times daily as needed (for gout flares). ) 60 tablet 2  . furosemide (LASIX) 40 MG tablet Take 40 mg by mouth as needed for edema.     Marland Kitchen HYDROcodone-acetaminophen (NORCO/VICODIN) 5-325 MG per tablet Take 1-2 tablets by mouth every 6 (six) hours as needed for moderate pain. 6 tablet 0  . insulin glargine (LANTUS) 100 UNIT/ML injection Inject 7 Units into the skin at bedtime.    Marland Kitchen lisinopril (PRINIVIL,ZESTRIL) 40 MG tablet Take 1 tablet (40 mg total) by mouth daily. 30 tablet 6  . metolazone (ZAROXOLYN) 2.5 MG tablet Take 2.5 mg by mouth as needed (for excess fluid).     . mometasone (NASONEX) 50 MCG/ACT nasal spray Place 2 sprays into the nose at bedtime.    . Multiple Vitamin (MULTIVITAMIN WITH MINERALS) TABS tablet Take 1 tablet by mouth daily.    . mycophenolate (MYFORTIC) 180 MG EC tablet Take 720 mg by mouth 2 (two) times daily.     Marland Kitchen omeprazole (PRILOSEC) 20 MG capsule TAKE 1 CAPSULE BY MOUTH DAILY. 90 capsule 3  . potassium chloride SA (K-DUR,KLOR-CON) 20 MEQ tablet Take 40 mEq by mouth as needed (when taking Furosemide or Metolazone.).     Marland Kitchen tacrolimus (PROGRAF) 0.5 MG capsule Take 0.5 mg by mouth every evening.    . tacrolimus (PROGRAF) 1 MG capsule Take 1 mg by mouth daily.  No current facility-administered medications on file prior to visit.    Allergies  Allergen Reactions  . Morphine Itching    Past Medical History  Diagnosis Date  . Hypertension   . Hyperlipidemia     low since renal failure  . Kidney transplant status, cadaveric 2012  . Depression   . GERD (gastroesophageal reflux disease)   . Diabetes mellitus   . Gout   . Asthma   . ESRD (end stage renal disease) 2005    Dialysis then renal transplant  . CAD (coronary artery disease) 2005    had CABG  . CHF (congestive heart failure)   . Pneumonia   . Wears glasses   . Sleep apnea     bipap with oxygen 2 l  . Atrial fibrillation   . Trigger finger   . Amnestic MCI (mild cognitive  impairment with memory loss)   . Dyspnea   . Benign neoplasm of colon   . Diverticulosis   . Chronic venous stasis dermatitis of both lower extremities   . Purpura   . Erectile dysfunction   . Obesity     Past Surgical History  Procedure Laterality Date  . Kidney transplant  09/13/2010    cadaver--at [facility]  . Tympanic membrane repair  1/12    left  . Dg angio av shunt*l*      right and left upper arms  . Fasciotomy  03/03/2012    Procedure: FASCIOTOMY;  Surgeon: [name], MD;  Location: [place];  Service: Orthopedics;  Laterality: Right;  FASCIOTOMY RIGHT SMALL FINGER  . Cardiac catheterization      [facility]  . Coronary artery bypass graft  2005    X 4  . Colonoscopy with propofol N/A 04/22/2013    Procedure: COLONOSCOPY WITH PROPOFOL;  Surgeon: [name], MD;  Location: [facility];  Service: Endoscopy;  Laterality: N/A;  . Fasciotomy Left 08/17/2013    Procedure: FASCIOTOMY LEFT RING;  Surgeon: [name], MD;  Location: [place];  Service: Orthopedics;  Laterality: Left;  . Bariatric surgery    . Vasectomy      Family History  Problem Relation Age of Onset  . Heart disease Father   . Diabetes Maternal Grandmother   . Breast cancer Maternal Grandmother   . Liver cancer Paternal Uncle   . Liver cancer Paternal Grandmother   . Kidney failure Father     Social History   Social History  . Marital Status: Married    Spouse Name: N/A  . Number of Children: 2  . Years of Education: N/A   Occupational History  . 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 Smoking status: Former Smoker    Types: Cigars    Quit date: 09/02/1994  . Smokeless tobacco: Never Used     Comment: cigar- once in a while  . Alcohol Use: No     Comment: occasional  . Drug Use: No  . Sexual Activity: Not on file   Other Topics Concern  . Not on file   Social History Narrative   In school for  culinary arts--- will finish 12/16      Has living will   Wife is health care POA   Would accept resuscitation attempts   Hasn't considered tube feedings   Review of Systems Appetite is off--has to force himself to eat No dumping  No swallowing problems  No sig mood problems    Objective:   Physical Exam  Constitutional: He appears well-developed and well-nourished. No distress.  Neck: Normal range of motion. Neck supple. No thyromegaly present.  Cardiovascular: Normal rate and intact distal pulses.  Exam reveals no gallop.   No murmur heard. irregular Rate ion 60's  Pulmonary/Chest: Effort normal and breath sounds normal. No respiratory distress. He has no wheezes. He has no rales.  Musculoskeletal:  Trace ankle edema  Lymphadenopathy:    He has no cervical adenopathy.  Neurological:  Normal sensation in feet  Skin:  No foot lesions  Psychiatric: He has a normal mood and affect. His behavior is normal.          Assessment & Plan:

## 2014-11-02 NOTE — Assessment & Plan Note (Signed)
Neutral fluid status No changes needed

## 2014-11-02 NOTE — Assessment & Plan Note (Signed)
Now well controlled since bariatric surgery Lab Results  Component Value Date   HGBA1C 7.2* 09/09/2014

## 2014-11-02 NOTE — Assessment & Plan Note (Signed)
Rate is better now On eliquis

## 2014-11-02 NOTE — Assessment & Plan Note (Signed)
Will continue the medication

## 2014-11-04 ENCOUNTER — Other Ambulatory Visit: Payer: Self-pay | Admitting: *Deleted

## 2014-11-04 ENCOUNTER — Telehealth: Payer: Self-pay | Admitting: Cardiovascular Disease

## 2014-11-04 MED ORDER — AMLODIPINE BESYLATE 5 MG PO TABS: 5.0000 mg | ORAL_TABLET | Freq: Every day | ORAL | Status: DC

## 2014-11-04 NOTE — Telephone Encounter (Signed)
1. Which medications need to be refilled?  Norvasc 5 mg po once daily    2. Which pharmacy is medication to be sent to? [facility]   3. Do they need a 30 day or 90 day supply? 90  4. Would they like a call back once the medication has been sent to the pharmacy? Call if issues

## 2014-11-07 ENCOUNTER — Ambulatory Visit (INDEPENDENT_AMBULATORY_CARE_PROVIDER_SITE_OTHER): Payer: 59 | Admitting: Urology

## 2014-11-07 ENCOUNTER — Encounter: Payer: Self-pay | Admitting: Urology

## 2014-11-07 VITALS — BP 143/70 | HR 96 | Ht 71.0 in | Wt 276.4 lb

## 2014-11-07 DIAGNOSIS — E291 Testicular hypofunction: Secondary | ICD-10-CM | POA: Diagnosis not present

## 2014-11-07 MED ORDER — TESTOSTERONE 75 MG IL PLLT
75.0000 mg | PELLET | Freq: Once | Status: AC
  Administered 2014-11-07: 75 mg

## 2014-11-07 MED ORDER — LIDOCAINE-EPINEPHRINE 1 %-1:100000 IJ SOLN
10.0000 mL | Freq: Once | INTRAMUSCULAR | Status: AC
  Administered 2014-11-07: 10 mL via INTRADERMAL

## 2014-11-07 NOTE — Progress Notes (Signed)
This is a 54 -year-old male with hypogonadism and he is managed with Testopel. He presents today for Testopel insertion.  Patient is placed on the exam table in the right lateral jackknife position.  Identified upper outer quadrant of hip for insertion; prepped area with Betadine and injected 10 cc's of Lidocaine 1% with Epinephrine to anesthetize superficially and distally along trocar tract.  Made 3 mm incision using 15 blade of scalpel; trocar with sharp ended stylet was inserted into subcutaneous tissue in line with femur. Sharp stylet was withdrawn and 6 pellets were placed into trocar well. Testopel pellets advanced into tissue using blunt ended stylet. Trocar removed and incision closed using 6 Steri-Strips. Cleansed area to remove Betadine and covered Steri-Strips with outer Band-Aid.  Careful inspection of insertion is done and patient informed of post procedure instructions.  He will return in one month for serum testosterone before 9:00am.

## 2014-11-15 ENCOUNTER — Telehealth: Payer: Self-pay | Admitting: *Deleted

## 2014-11-15 MED ORDER — AMLODIPINE BESYLATE 5 MG PO TABS: 5.0000 mg | ORAL_TABLET | Freq: Every day | ORAL | Status: DC

## 2014-11-15 NOTE — Telephone Encounter (Signed)
  1. Which medications need to be refilled? Amlodipine  72m  2. [facility]  3. Do they need a 30 day or 90 day supply? 90 day supply  4. Would they like a call back once the medication has been sent to the pharmacy? Yes please

## 2014-11-15 NOTE — Telephone Encounter (Signed)
Refill sent for amlodipine.

## 2014-11-17 ENCOUNTER — Telehealth: Payer: Self-pay | Admitting: Internal Medicine

## 2014-11-17 NOTE — Telephone Encounter (Signed)
Patient Name: [name] DOB: [birth year] Initial Comment Caller states his leg is really red and hot Nurse Assessment Nurse: Mechele Dawley, RN, Amy Date/Time Eilene Ghazi Time): 11/17/2014 2:22:45 PM Confirm and document reason for call. If symptomatic, describe symptoms. ---Caller states that his leg is really red and hot. He states that his left leg on his shin. He scrapped his leg last week while he was working on some plumbing. He did clean it and put some Neopsporin on it. He is concerned as he is a diabetic. Leg is very warm to touch. Area of redness is about 5 inches long and 3 inches wide. Has the patient traveled out of the country within the last 30 days? ---Not Applicable Does the patient require triage? ---Yes Related visit to physician within the last 2 weeks? ---No Does the PT have any chronic conditions? (i.e. diabetes, asthma, etc.) ---Yes List chronic conditions. ---AFIB, DIABETIC, KIDNEY TRANSPLANT, Guidelines Guideline Title Affirmed Question Affirmed Notes Wound Infection Patient sounds very sick or weak to the triager Final Disposition User Go to ED Now (or PCP triage) Mechele Dawley, RN, Amy Referrals GO TO FACILITY OTHER - SPECIFY Disagree/Comply: Comply

## 2014-11-17 NOTE — Telephone Encounter (Signed)
I agree he should be seen right away Check on him tomorrow

## 2014-11-18 NOTE — Telephone Encounter (Signed)
Spoke with patient and he went to [facility] urgent care, he did have an infection in his leg, he was prescribed a cream and oral abx.

## 2014-11-19 NOTE — Telephone Encounter (Signed)
[name] he got taken care of

## 2014-11-21 ENCOUNTER — Other Ambulatory Visit: Payer: Self-pay

## 2014-11-21 NOTE — Patient Outreach (Signed)
[facility]) Care Management  11/21/2014  [name] 1961-01-02 [id number]   Sent an e-mail to the patient to schedule his next appointment to see me in September, 2016.   [name], RN, BA, [place], [place] Direct Dial:  [phone]  Fax:  [phone] E-mail: [name] [address], [place], [place]  [id number]

## 2014-12-06 ENCOUNTER — Other Ambulatory Visit: Payer: Medicare Other

## 2014-12-06 ENCOUNTER — Other Ambulatory Visit: Payer: Self-pay

## 2014-12-06 ENCOUNTER — Other Ambulatory Visit: Payer: Self-pay | Admitting: Obstetrics and Gynecology

## 2014-12-06 VITALS — BP 132/68 | Ht 71.0 in | Wt 270.4 lb

## 2014-12-06 DIAGNOSIS — E119 Type 2 diabetes mellitus without complications: Secondary | ICD-10-CM

## 2014-12-06 DIAGNOSIS — Z9852 Vasectomy status: Secondary | ICD-10-CM

## 2014-12-06 DIAGNOSIS — E291 Testicular hypofunction: Secondary | ICD-10-CM

## 2014-12-06 LAB — POCT GLYCOSYLATED HEMOGLOBIN (HGB A1C): Hemoglobin A1C: 6.7

## 2014-12-06 NOTE — Addendum Note (Signed)
Addended by: Wilson Singer on: 12/06/2014 05:33 PM   Modules accepted: Orders

## 2014-12-06 NOTE — Patient Outreach (Signed)
[name]) Care Management  [name]  12/06/2014   [name] May 05, 1960 [id number]  Subjective: Patient in for his regularly scheduled [facility] visit. He is very pleased with his weight loss but said "I've reached a plateau".  States he had an episode where he developed chest pain in the middle of the night and had to go to the hospital, the day after I last saw him.  When he was at the hospital, his heart rate was 43 bpm. He tells me he's been diagnosed with Atrial Fib.  His Carvedilol was stopped and he was started on Amlodipine.  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 He tells me he has scheduled his dermatology and dental appointments and has a follow up with Dr. Silvio Pate in October.  He reports only checking blood sugars three times a week. Denies chest pain or SOB since the hospital episode.   Objective:  Filed Vitals:   12/06/14 1402  BP: 132/68  Height: 1.803 m (_0 )  Weight: 270 lb 6.4 oz (122.653 kg)   POCT A1C 6.7%  Current Medications:  Current Outpatient Prescriptions  Medication Sig Dispense Refill  . albuterol (PROAIR HFA) 108 (90 BASE) MCG/ACT inhaler Inhale 2 puffs into the lungs 4 (four) times daily as needed for wheezing or shortness of breath (cough). (Patient taking differently: Inhale 2 puffs into the lungs 4 (four) times daily as needed for wheezing or shortness of breath. ) 1 Inhaler 3  . amLODipine (NORVASC) 5 MG tablet Take 1 tablet (5 mg total) by mouth daily. 90 tablet 3  . apixaban (ELIQUIS) 5 MG TABS tablet Take 1 tablet (5 mg total) by mouth 2 (two) times daily. 60 tablet 6  . beclomethasone (QVAR) 80 MCG/ACT inhaler Inhale 2 puffs into the lungs 2 (two) times daily. (Patient taking differently: Inhale 2 puffs into the lungs 2 (two) times daily as needed (for shortness of breath). ) 3 Inhaler 3  . buPROPion (WELLBUTRIN) 75 MG tablet Take 1 tablet (75 mg total) by mouth 2 (two)  times daily. 180 tablet 3  . CIALIS 20 MG tablet Take 1/2 to 1 tablet every other day as needed for erectile dysfunction. (Patient taking differently: Take 10-20 mg by mouth as needed for erectile dysfunction. ) 6 tablet 11  . colchicine 0.6 MG tablet Take 1 tablet (0.6 mg total) by mouth 2 (two) times daily as needed. (Patient taking differently: Take 0.6 mg by mouth 2 (two) times daily as needed (for gout flares). ) 60 tablet 2  . furosemide (LASIX) 40 MG tablet Take 40 mg by mouth as needed for edema.     Marland Kitchen HYDROcodone-acetaminophen (NORCO/VICODIN) 5-325 MG per tablet Take 1-2 tablets by mouth every 6 (six) hours as needed for moderate pain. 6 tablet 0  . insulin glargine (LANTUS) 100 UNIT/ML injection Inject 7 Units into the skin at bedtime.    . Multiple Vitamin (MULTIVITAMIN WITH MINERALS) TABS tablet Take 1 tablet by mouth daily.    . mycophenolate (MYFORTIC) 180 MG EC tablet Take 720 mg by mouth 2 (two) times daily.     Marland Kitchen omeprazole (PRILOSEC) 20 MG capsule TAKE 1 CAPSULE BY MOUTH DAILY. 90 capsule 3  . potassium chloride SA (K-DUR,KLOR-CON) 20 MEQ tablet Take 40 mEq by mouth as needed (when taking Furosemide or Metolazone.).     Marland Kitchen tacrolimus (PROGRAF) 0.5 MG capsule Take 0.5 mg by mouth every evening.    Marland Kitchen  tacrolimus (PROGRAF) 1 MG capsule Take 1 mg by mouth daily.     Marland Kitchen lisinopril (PRINIVIL,ZESTRIL) 40 MG tablet Take 1 tablet (40 mg total) by mouth daily. 30 tablet 6  . metolazone (ZAROXOLYN) 2.5 MG tablet Take 2.5 mg by mouth as needed (for excess fluid).     . mometasone (NASONEX) 50 MCG/ACT nasal spray Place 2 sprays into the nose at bedtime.     No current facility-administered medications for this visit.    Functional Status:  In your present state of health, do you have any difficulty performing the following activities: 12/06/2014 10/13/2014  Hearing? N N  Vision? N N  Difficulty concentrating or making decisions? N N  Walking or climbing stairs? N N  Dressing or bathing? N N   Doing errands, shopping? N N    Fall/Depression Screening: PHQ 2/9 Scores 12/06/2014 10/12/2014  PHQ - 2 Score 0 0    Assessment: Well controlled diabetes with an A1C of 6.7%.  [name] is engaged in managing his diabetes; taking medications as ordered and walking most days of the week to manage blood sugars and stress.  Reviewed the importance of eliminating simple sugars in his diet (brown sugar in cereal, honey in his tea).  Reviewed typical intake in a day- he believes he's getting about 60 grams of protein in per day.   Plan: Continue to exercise most days of the week. Check blood sugars daily. Continue to take Lantus 5-7 units daily- call Dr. Silvio Pate if you begin to have low blood sugars.   [facility] CM Care Plan Problem One        Most Recent Value   Care Plan for Problem One  Active   [facility] Long Term Goal Met Date  12/06/14    [facility] CM Care Plan Problem Two        Most Recent Value   Care Plan Problem Two  Potential for elevated A1C > 7%   Role Documenting the Problem Two  Care Management Coordinator   Care Plan for Problem Two  Active   Interventions for Problem Two Long Term Goal   1. Continue walking at least 4x/week for 30 minutes  2. continue to take Lantus at ordered   [facility] Long Term Goal (31-90) days  Patient will maintain A1C less than 7%   [facility] Long Term Goal Start Date  12/06/14     Follow up in 3 months.   [name], RN, BA, [place], [place] Direct Dial:  [phone]  Fax:  [phone] E-mail: [name] [address], [place], [place]  [address]

## 2014-12-06 NOTE — Addendum Note (Signed)
Addended by: Wilson Singer on: 12/06/2014 05:28 PM   Modules accepted: Orders

## 2014-12-07 ENCOUNTER — Telehealth: Payer: Self-pay

## 2014-12-07 DIAGNOSIS — E291 Testicular hypofunction: Secondary | ICD-10-CM

## 2014-12-07 LAB — TESTOSTERONE: TESTOSTERONE: 429 ng/dL (ref 348–1197)

## 2014-12-07 NOTE — Telephone Encounter (Signed)
LMOM-made pt aware for the need for a call back due to making sure paper work is complete.

## 2014-12-07 NOTE — Telephone Encounter (Signed)
Spoke with patient to let him know his paper work for the Testopel was good for the next insertion. Patient states he does not want testopel anymore. He was in pain for two weeks afterwards. He is interested in the injection. I let him know I would pass the information along to [facility] and give him a call back with her response. Patient ok with plan.

## 2014-12-07 NOTE — Addendum Note (Signed)
Addended by: Wilson Singer on: 12/07/2014 08:39 AM   Modules accepted: Orders

## 2014-12-07 NOTE — Telephone Encounter (Signed)
That is fine.  After we received his blood work in 3 months, he can begin the injections.

## 2014-12-07 NOTE — Telephone Encounter (Signed)
-----  Message from Nori Riis, PA-C sent at 12/07/2014  8:40 AM EDT ----- Testosterone is normal.  We will see him in 3 months for another Testopel insertion.  We will need a morning serum testosterone one week prior before 9 AM.

## 2014-12-09 LAB — POST-VAS SPERM EVALUATION,QUAL

## 2014-12-12 ENCOUNTER — Telehealth: Payer: Self-pay

## 2014-12-12 NOTE — Telephone Encounter (Signed)
LMOM- pt needs to provide another specimen due to not enough sample.

## 2014-12-12 NOTE — Telephone Encounter (Signed)
-----  Message from Roda Shutters, [place] sent at 12/09/2014  4:48 PM EDT ----- We please call [facility] and check on this and notify patient if he needs to come in to provide another semen specimen for analysis.

## 2014-12-26 NOTE — Telephone Encounter (Signed)
LMOM that Larene Beach states that is fine for not taking the testopel anymore but he will need to still come back in three months for blood work and when [employer] reviews results he can start the injections again. If he has any questions he can call office back. Orders placed for future Testosterone.

## 2014-12-26 NOTE — Addendum Note (Signed)
Addended by: Orlene Erm on: 12/26/2014 01:34 PM   Modules accepted: Orders

## 2015-02-03 ENCOUNTER — Encounter: Payer: Self-pay | Admitting: Cardiovascular Disease

## 2015-02-03 ENCOUNTER — Ambulatory Visit (INDEPENDENT_AMBULATORY_CARE_PROVIDER_SITE_OTHER): Payer: 59 | Admitting: Cardiovascular Disease

## 2015-02-03 VITALS — BP 150/86 | HR 68 | Ht 71.0 in | Wt 279.8 lb

## 2015-02-03 DIAGNOSIS — I10 Essential (primary) hypertension: Secondary | ICD-10-CM | POA: Diagnosis not present

## 2015-02-03 DIAGNOSIS — I5022 Chronic systolic (congestive) heart failure: Secondary | ICD-10-CM | POA: Diagnosis not present

## 2015-02-03 DIAGNOSIS — I200 Unstable angina: Secondary | ICD-10-CM

## 2015-02-03 DIAGNOSIS — I482 Chronic atrial fibrillation, unspecified: Secondary | ICD-10-CM

## 2015-02-03 NOTE — Assessment & Plan Note (Addendum)
Most recent ejection fraction was 35-40%. He appears to be euvolemic. He is no longer on a beta blocker due to symptomatic bradycardia while on small dose carvedilol.  given his elevated blood pressure, we should consider switching him from lisinopril to [facility].

## 2015-02-03 NOTE — Patient Instructions (Signed)
Medication Instructions: Continue same medications.   Labwork: None.   Procedures/Testing: None.   Follow-Up: 6 months with Dr. Arida.   Any Additional Special Instructions Will Be Listed Below (If Applicable).   

## 2015-02-03 NOTE — Progress Notes (Signed)
HPI  This is a pleasant 54 year-old male who is here today for a follow up visit. He has known history of coronary artery disease status post CABG in 2004 with no recurrent ischemic events since then, chronic systolic heart failure with ejection fraction of 35-40 %, type 2 diabetes, end-stage renal disease previously on hemodialysis from 2004 - 2012 when he got a kidney transplant, hypertension, hyperlipidemia, chronic atrial fibrillation and sleep apnea. He was evaluated by me in February of 2014 when he was admitted to [facility] with atypical chest pain in the setting of bronchitis. He underwent an outpatient nuclear stress test which showed no evidence of ischemia. However, the ejection fraction was reported to be 31%.  Echocardiogram in February of 2015 showed an ejection fraction of 35-40% with grade 2 diastolic dysfunction and significantly dilated left atrium.  She underwent gastric bypass in September 1610 without complications. He lost 60 pounds since then but he is not losing anymore.  he did have significant bradycardia while he was on small dose carvedilol which was discontinued.  he has been doing very well  With no chest pain, shortness of breath, palpitations or dizziness. Blood pressure continues to be mildly elevated. Leg edema seems to be stable and slightly improved from before.  Allergies  Allergen Reactions  . Morphine Itching     Current Outpatient Prescriptions on File Prior to Visit  Medication Sig Dispense Refill  . albuterol (PROAIR HFA) 108 (90 BASE) MCG/ACT inhaler Inhale 2 puffs into the lungs 4 (four) times daily as needed for wheezing or shortness of breath (cough). (Patient taking differently: Inhale 2 puffs into the lungs 4 (four) times daily as needed for wheezing or shortness of breath. ) 1 Inhaler 3  . amLODipine (NORVASC) 5 MG tablet Take 1 tablet (5 mg total) by mouth daily. 90 tablet 3  . apixaban (ELIQUIS) 5 MG TABS tablet Take 1 tablet (5 mg total) by mouth 2  (two) times daily. 60 tablet 6  . beclomethasone (QVAR) 80 MCG/ACT inhaler Inhale 2 puffs into the lungs 2 (two) times daily. (Patient taking differently: Inhale 2 puffs into the lungs 2 (two) times daily as needed (for shortness of breath). ) 3 Inhaler 3  . buPROPion (WELLBUTRIN) 75 MG tablet Take 1 tablet (75 mg total) by mouth 2 (two) times daily. 180 tablet 3  . CIALIS 20 MG tablet Take 1/2 to 1 tablet every other day as needed for erectile dysfunction. (Patient taking differently: Take 10-20 mg by mouth as needed for erectile dysfunction. ) 6 tablet 11  . colchicine 0.6 MG tablet Take 1 tablet (0.6 mg total) by mouth 2 (two) times daily as needed. (Patient taking differently: Take 0.6 mg by mouth 2 (two) times daily as needed (for gout flares). ) 60 tablet 2  . furosemide (LASIX) 40 MG tablet Take 40 mg by mouth as needed for edema.     Marland Kitchen HYDROcodone-acetaminophen (NORCO/VICODIN) 5-325 MG per tablet Take 1-2 tablets by mouth every 6 (six) hours as needed for moderate pain. 6 tablet 0  . insulin glargine (LANTUS) 100 UNIT/ML injection Inject 7 Units into the skin at bedtime.    Marland Kitchen lisinopril (PRINIVIL,ZESTRIL) 40 MG tablet Take 1 tablet (40 mg total) by mouth daily. 30 tablet 6  . metolazone (ZAROXOLYN) 2.5 MG tablet Take 2.5 mg by mouth as needed (for excess fluid).     . mometasone (NASONEX) 50 MCG/ACT nasal spray Place 2 sprays into the nose at bedtime.    Marland Kitchen  Multiple Vitamin (MULTIVITAMIN WITH MINERALS) TABS tablet Take 1 tablet by mouth daily.    . mycophenolate (MYFORTIC) 180 MG EC tablet Take 720 mg by mouth 2 (two) times daily.     Marland Kitchen omeprazole (PRILOSEC) 20 MG capsule TAKE 1 CAPSULE BY MOUTH DAILY. 90 capsule 3  . potassium chloride SA (K-DUR,KLOR-CON) 20 MEQ tablet Take 40 mEq by mouth as needed (when taking Furosemide or Metolazone.).     Marland Kitchen tacrolimus (PROGRAF) 0.5 MG capsule Take 0.5 mg by mouth every evening.    . tacrolimus (PROGRAF) 1 MG capsule Take 1 mg by mouth daily.      No  current facility-administered medications on file prior to visit.     Past Medical History  Diagnosis Date  . Hypertension   . Hyperlipidemia     low since renal failure  . Kidney transplant status, cadaveric 2012  . Depression   . GERD (gastroesophageal reflux disease)   . Diabetes mellitus   . Gout   . Asthma   . ESRD (end stage renal disease) ([place]) 2005    Dialysis then renal transplant  . CAD (coronary artery disease) 2005    had CABG  . CHF (congestive heart failure) ([place])   . Pneumonia   . Wears glasses   . Sleep apnea     bipap with oxygen 2 l  . Atrial fibrillation ([place])   . Trigger finger   . Amnestic MCI (mild cognitive impairment with memory loss)   . Dyspnea   . Benign neoplasm of colon   . Diverticulosis   . Chronic venous stasis dermatitis of both lower extremities   . Purpura ([place])   . Erectile dysfunction   . Obesity   . Atrial fibrillation ([place]) 2016     Past Surgical History  Procedure Laterality Date  . Kidney transplant  09/13/2010    cadaver--at [facility]  . Tympanic membrane repair  1/12    left  . Dg angio av shunt*l*      right and left upper arms  . Fasciotomy  03/03/2012    Procedure: FASCIOTOMY;  Surgeon: [name], MD;  Location: [facility];  Service: Orthopedics;  Laterality: Right;  FASCIOTOMY RIGHT SMALL FINGER  . Cardiac catheterization      [facility]  . Coronary artery bypass graft  2005    X 4  . Colonoscopy with propofol N/A 04/22/2013    Procedure: COLONOSCOPY WITH PROPOFOL;  Surgeon: [name], MD;  Location: [facility];  Service: Endoscopy;  Laterality: N/A;  . Fasciotomy Left 08/17/2013    Procedure: FASCIOTOMY LEFT RING;  Surgeon: [name], MD;  Location: [facility];  Service: Orthopedics;  Laterality: Left;  . Bariatric surgery    . Vasectomy       Family History  Problem Relation Age of Onset  . Heart disease Father   . Diabetes Maternal Grandmother   . Breast cancer Maternal  Grandmother   . Liver cancer Paternal Uncle   . Liver cancer Paternal Grandmother   . Kidney failure Father   . Prostate cancer Neg Hx      Social History   Social History  . Marital Status: Married    Spouse Name: N/A  . Number of Children: 2  . Years of Education: N/A   Occupational History  . 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 Smoking status: Former Smoker  Types: Cigars    Quit date: 09/02/1994  . Smokeless tobacco: Never Used     Comment: cigar- once in a while  . Alcohol Use: No     Comment: occasional  . Drug Use: No  . Sexual Activity: Not on file   Other Topics Concern  . Not on file   Social History Narrative   In school for culinary arts--- will finish 12/16      Has living will   Wife is health care POA   Would accept resuscitation attempts   Hasn't considered tube feedings      PHYSICAL EXAM   BP 150/86 mmHg  Pulse 68  Ht _0  (1.803 m)  Wt 279 lb 12 oz (126.894 kg)  BMI 39.03 kg/m2 Constitutional: He is oriented to person, place, and time. He appears well-developed and well-nourished. No distress.  HENT: No nasal discharge.  Head: Normocephalic and atraumatic.  Eyes: Pupils are equal and round.  No discharge. Neck: Normal range of motion. Neck supple. No JVD present. No thyromegaly present.  Cardiovascular:Normal rate, irregular rhythm, normal heart sounds. Exam reveals no gallop and no friction rub. No murmur heard.  Pulmonary/Chest: Effort normal and breath sounds normal. No stridor. No respiratory distress. He has no wheezes. He has no rales. He exhibits no tenderness.  Abdominal: Soft. Bowel sounds are normal. He exhibits no distension. There is no tenderness. There is no rebound and no guarding.  Musculoskeletal: Normal range of motion. He exhibits+1  edema and no tenderness.  Neurological: He is alert and oriented to person, place, and time. Coordination normal.  Skin: Skin is  warm and dry. No rash noted. He is not diaphoretic. No erythema. No pallor.  Psychiatric: He has a normal mood and affect. His behavior is normal. Judgment and thought content normal.       EKG:  atrial fibrillation  With PVCs -Left bundle branch block and left axis.   ABNORMAL    ASSESSMENT AND PLAN

## 2015-02-03 NOTE — Assessment & Plan Note (Signed)
Ventricular rate is well controlled without any medication. He is tolerating anticoagulation.

## 2015-02-03 NOTE — Assessment & Plan Note (Signed)
Blood pressure continues to be mildly elevated. He is on lisinopril and amlodipine.  continue to monitor for now and will  Consider making the change  Outlined above or  adding a small dose thiazide diuretic.

## 2015-02-15 ENCOUNTER — Other Ambulatory Visit: Payer: Self-pay | Admitting: Urology

## 2015-02-15 NOTE — Telephone Encounter (Signed)
LMOM  For patient that per Dr. Erlene Quan will not give anymore pain medication and his insulin needs to be given from Dr. Who handles his Diabetes. If any questions please call the office back.

## 2015-02-20 ENCOUNTER — Telehealth: Payer: Self-pay

## 2015-02-20 NOTE — Telephone Encounter (Signed)
No, he my not have a refill. Ramona LMOM for patient last week.  Hollice Espy, MD

## 2015-02-20 NOTE — Telephone Encounter (Signed)
Pt sent a message via my chart requesting a refill on norco. Please advise.

## 2015-02-23 ENCOUNTER — Other Ambulatory Visit: Payer: Self-pay | Admitting: Cardiovascular Disease

## 2015-02-28 ENCOUNTER — Telehealth: Payer: Self-pay | Admitting: Urology

## 2015-02-28 DIAGNOSIS — E291 Testicular hypofunction: Secondary | ICD-10-CM

## 2015-02-28 DIAGNOSIS — R972 Elevated prostate specific antigen [PSA]: Secondary | ICD-10-CM

## 2015-02-28 DIAGNOSIS — Z79899 Other long term (current) drug therapy: Secondary | ICD-10-CM

## 2015-02-28 NOTE — Telephone Encounter (Signed)
Patient is overdue for an office visit and he will need blood work prior to the visit.  He will need a TSH, HCT, PSA, hepatic panel, morning testosterone and lipids.

## 2015-02-28 NOTE — Telephone Encounter (Signed)
Patient is overdue for an office visit.  He will need

## 2015-03-01 NOTE — Telephone Encounter (Signed)
LMOM

## 2015-03-02 NOTE — Telephone Encounter (Signed)
[name] made pt appts and orders placed to lab.

## 2015-03-02 NOTE — Telephone Encounter (Signed)
LMOM- overdue for office visit and lab work

## 2015-03-07 ENCOUNTER — Other Ambulatory Visit: Payer: Medicare Other

## 2015-03-07 ENCOUNTER — Other Ambulatory Visit
Admission: RE | Admit: 2015-03-07 | Discharge: 2015-03-07 | Disposition: A | Discharge status: Home / Self Care | Payer: 59 | Source: Ambulatory Visit | Attending: Nephrology | Admitting: Nephrology

## 2015-03-07 DIAGNOSIS — R972 Elevated prostate specific antigen [PSA]: Secondary | ICD-10-CM

## 2015-03-07 DIAGNOSIS — Z940 Kidney transplant status: Secondary | ICD-10-CM | POA: Insufficient documentation

## 2015-03-07 DIAGNOSIS — Z79899 Other long term (current) drug therapy: Secondary | ICD-10-CM | POA: Insufficient documentation

## 2015-03-07 DIAGNOSIS — E291 Testicular hypofunction: Secondary | ICD-10-CM

## 2015-03-07 LAB — BASIC METABOLIC PANEL
Anion gap: 9 (ref 5–15)
BUN: 13 mg/dL (ref 6–20)
CO2: 32 mmol/L (ref 22–32)
CREATININE: 0.9 mg/dL (ref 0.61–1.24)
Calcium: 8.9 mg/dL (ref 8.9–10.3)
Chloride: 101 mmol/L (ref 101–111)
GFR calc Af Amer: >60 mL/min (ref >60)
GLUCOSE: 169 mg/dL — AB (ref 65–99)
POTASSIUM: 3.6 mmol/L (ref 3.5–5.1)
SODIUM: 142 mmol/L (ref 135–145)

## 2015-03-08 LAB — HEPATIC FUNCTION PANEL
ALT: 10 IU/L (ref 0–44)
AST: 17 IU/L (ref 0–40)
Albumin: 3.9 g/dL (ref 3.5–5.5)
Alkaline Phosphatase: 169 IU/L — ABNORMAL HIGH (ref 39–117)
Bilirubin Total: 1.2 mg/dL (ref 0.0–1.2)
Bilirubin, Direct: 0.7 mg/dL — ABNORMAL HIGH (ref 0.00–0.40)
TOTAL PROTEIN: 6.3 g/dL (ref 6.0–8.5)

## 2015-03-08 LAB — TSH: TSH: 1.7 u[IU]/mL (ref 0.450–4.500)

## 2015-03-08 LAB — HEMATOCRIT: HEMATOCRIT: 40.1 % (ref 37.5–51.0)

## 2015-03-08 LAB — LIPID PANEL
CHOL/HDL RATIO: 4.2 ratio (ref 0.0–5.0)
Cholesterol, Total: 161 mg/dL (ref 100–199)
HDL: 38 mg/dL — ABNORMAL LOW (ref >39)
LDL CALC: 105 mg/dL — AB (ref 0–99)
Triglycerides: 88 mg/dL (ref 0–149)
VLDL CHOLESTEROL CAL: 18 mg/dL (ref 5–40)

## 2015-03-08 LAB — TESTOSTERONE: Testosterone: 470 ng/dL (ref 348–1197)

## 2015-03-08 LAB — PSA: Prostate Specific Ag, Serum: 0.7 ng/mL (ref 0.0–4.0)

## 2015-03-09 LAB — TACROLIMUS LEVEL: TACROLIMUS (FK506) - LABCORP: 5.3 ng/mL (ref 2.0–20.0)

## 2015-03-10 ENCOUNTER — Ambulatory Visit (INDEPENDENT_AMBULATORY_CARE_PROVIDER_SITE_OTHER): Payer: 59 | Admitting: Urology

## 2015-03-10 ENCOUNTER — Encounter: Payer: Self-pay | Admitting: Urology

## 2015-03-10 VITALS — BP 157/80 | HR 61 | Ht 71.0 in | Wt 273.4 lb

## 2015-03-10 DIAGNOSIS — N401 Enlarged prostate with lower urinary tract symptoms: Secondary | ICD-10-CM | POA: Diagnosis not present

## 2015-03-10 DIAGNOSIS — I200 Unstable angina: Secondary | ICD-10-CM

## 2015-03-10 DIAGNOSIS — E291 Testicular hypofunction: Secondary | ICD-10-CM | POA: Diagnosis not present

## 2015-03-10 DIAGNOSIS — N138 Other obstructive and reflux uropathy: Secondary | ICD-10-CM

## 2015-03-10 DIAGNOSIS — N529 Male erectile dysfunction, unspecified: Secondary | ICD-10-CM | POA: Diagnosis not present

## 2015-03-10 MED ORDER — TESTOSTERONE 20.25 MG/1.25GM (1.62%) TD GEL: TRANSDERMAL | Status: DC

## 2015-03-10 NOTE — Progress Notes (Signed)
03/10/2015 8:35 PM   [name] 09-05-1960 [id number]  Referring provider: [name], MD [place], [place] [address]  Chief Complaint  Patient presents with  . Elevated PSA    follow up  . Hypogonadism    HPI: Patient is a 54 year old Hispanic male with hypogonadism, erectile dysfunction and BPH with LUTS who presents today for 6 month follow-up.  Hypogonadism Patient presented with the symptoms of reduced libido, erectile dysfunction, a reduced incidence of spontaneous erection, an increase of body fat, a decrease in physical and work performance, disturbances in sleep patterns, decreased energy and motivation, a decrease in cognitive function and mood changes.  This is indicated by his responses to the ADAM questionnaire  His pretreatment testosterone levels were 244 ng/dL on 08/05/2014 and 213 ng/dL on 08/11/2014.  He is currently managing his hypogonadism with Testopel insertion, but he has been unhappy with this mode of therapy.   He did not feel that he had any improvement in his energy levels or libido.      Androgen Deficiency in the Aging Male      03/10/15 1000       Androgen Deficiency in the Aging Male   Do you have a decrease in libido (sex drive) Yes     Do you have lack of energy Yes     Do you have a decrease in strength and/or endurance Yes     Have you lost height Yes     Have you noticed a decreased "enjoyment of life" Yes     Are you sad and/or grumpy Yes     Are your erections less strong Yes     Have you noticed a recent deterioration in your ability to play sports Yes     Are you falling asleep after dinner Yes     Has there been a recent deterioration in your work performance Yes        Erectile dysfunction His SHIM score is 14, which is mild to moderate ED.   He has been having difficulty with erections for about 5 years.   His major complaint is achieving and maintaining an erection.  His libido is diminished.   His  risk factors for ED are age, BPH, CAD, DM and a smoking history.  He denies any painful erections or curvatures with his erections.   He has tried Cialis 20 mg on demand dosing in the past and found it helpful.          SHIM      03/10/15 1053       SHIM: Over the last 6 months:   How do you rate your confidence that you could get and keep an erection? Low     When you had erections with sexual stimulation, how often were your erections hard enough for penetration (entering your partner)? Sometimes (about half the time)     During sexual intercourse, how often were you able to maintain your erection after you had penetrated (entered) your partner? Difficult     During sexual intercourse, how difficult was it to maintain your erection to completion of intercourse? Difficult     When you attempted sexual intercourse, how often was it satisfactory for you? Difficult     SHIM Total Score   SHIM 14        Score: 1-7 Severe ED 8-11 Moderate ED 12-16 Mild-Moderate ED 17-21 Mild ED 22-25 No ED   BPH WITH LUTS  His IPSS score today is 8, which is moderate lower urinary tract symptomatology. He is mostly satisfied with his quality life due to his urinary symptoms. He denies any dysuria, hematuria or suprapubic pain.  He also denies any recent fevers, chills, nausea or vomiting.  He does not have a family history of PCa.      IPSS      03/10/15 1000       International Prostate Symptom Score   How often have you had the sensation of not emptying your bladder? Less than 1 in 5     How often have you had to urinate less than every two hours? Less than half the time     How often have you found you stopped and started again several times when you urinated? Not at All     How often have you found it difficult to postpone urination? Less than 1 in 5 times     How often have you had a weak urinary stream? Less than 1 in 5 times     How often have you had to strain to start urination? Less  than 1 in 5 times     How many times did you typically get up at night to urinate? 2 Times     Total IPSS Score 8     Quality of Life due to urinary symptoms   If you were to spend the rest of your life with your urinary condition just the way it is now how would you feel about that? Mostly Satisfied        Score:  1-7 Mild 8-19 Moderate 20-35 Severe   PMH: Past Medical History  Diagnosis Date  . Hypertension   . Hyperlipidemia     low since renal failure  . Kidney transplant status, cadaveric 2012  . Depression   . GERD (gastroesophageal reflux disease)   . Diabetes mellitus   . Gout   . Asthma   . ESRD (end stage renal disease) ([place]) 2005    Dialysis then renal transplant  . CAD (coronary artery disease) 2005    had CABG  . CHF (congestive heart failure) ([place])   . Pneumonia   . Wears glasses   . Sleep apnea     bipap with oxygen 2 l  . Atrial fibrillation ([place])   . Trigger finger   . Amnestic MCI (mild cognitive impairment with memory loss)   . Dyspnea   . Benign neoplasm of colon   . Diverticulosis   . Chronic venous stasis dermatitis of both lower extremities   . Purpura ([place])   . Erectile dysfunction   . Obesity   . Atrial fibrillation ([place]) 2016    Surgical History: Past Surgical History  Procedure Laterality Date  . Kidney transplant  09/13/2010    cadaver--at [facility]  . Tympanic membrane repair  1/12    left  . Dg angio av shunt*l*      right and left upper arms  . Fasciotomy  03/03/2012    Procedure: FASCIOTOMY;  Surgeon: [name], MD;  Location: [facility];  Service: Orthopedics;  Laterality: Right;  FASCIOTOMY RIGHT SMALL FINGER  . Cardiac catheterization      [facility]  . Coronary artery bypass graft  2005    X 4  . Colonoscopy with propofol N/A 04/22/2013    Procedure: COLONOSCOPY WITH PROPOFOL;  Surgeon: [name], MD;  Location: [facility];  Service: Endoscopy;  Laterality: N/A;  .  Fasciotomy Left 08/17/2013     Procedure: FASCIOTOMY LEFT RING;  Surgeon: [name], MD;  Location: [facility];  Service: Orthopedics;  Laterality: Left;  . Bariatric surgery    . Vasectomy      Home Medications:    Medication List       This list is accurate as of: 03/10/15 11:59 PM.  Always use your most recent med list.               albuterol 108 (90 BASE) MCG/ACT inhaler  Commonly known as:  PROAIR HFA  Inhale 2 puffs into the lungs 4 (four) times daily as needed for wheezing or shortness of breath (cough).     amLODipine 5 MG tablet  Commonly known as:  NORVASC  Take 1 tablet (5 mg total) by mouth daily.     apixaban 5 MG Tabs tablet  Commonly known as:  ELIQUIS  Take 1 tablet (5 mg total) by mouth 2 (two) times daily.     aspirin EC 81 MG tablet  Reported on 03/10/2015     BAYER CONTOUR NEXT TEST test strip  Generic drug:  glucose blood  Use as instructed three times daily     beclomethasone 80 MCG/ACT inhaler  Commonly known as:  QVAR  Inhale 2 puffs into the lungs 2 (two) times daily.     buPROPion 75 MG tablet  Commonly known as:  WELLBUTRIN  Take 1 tablet (75 mg total) by mouth 2 (two) times daily.     CIALIS 20 MG tablet  Generic drug:  tadalafil  Take 1/2 to 1 tablet every other day as needed for erectile dysfunction.     colchicine 0.6 MG tablet  Take 1 tablet (0.6 mg total) by mouth 2 (two) times daily as needed.     furosemide 40 MG tablet  Commonly known as:  LASIX  Take 40 mg by mouth as needed for edema.     HYDROcodone-acetaminophen 5-325 MG tablet  Commonly known as:  NORCO/VICODIN  Take 1-2 tablets by mouth every 6 (six) hours as needed for moderate pain.     insulin aspart 100 UNIT/ML injection  Commonly known as:  novoLOG  Inject into the skin.     insulin glargine 100 UNIT/ML injection  Commonly known as:  LANTUS  Inject 7 Units into the skin at bedtime.     lisinopril 40 MG tablet  Commonly known as:  PRINIVIL,ZESTRIL  TAKE 1 TABLET (40  MG TOTAL) BY MOUTH DAILY.     metolazone 2.5 MG tablet  Commonly known as:  ZAROXOLYN  Take 2.5 mg by mouth as needed (for excess fluid).     mometasone 50 MCG/ACT nasal spray  Commonly known as:  NASONEX  Place 2 sprays into the nose at bedtime.     multivitamin with minerals Tabs tablet  Take 1 tablet by mouth daily.     mycophenolate 180 MG EC tablet  Commonly known as:  MYFORTIC  Take 720 mg by mouth 2 (two) times daily.     omeprazole 20 MG capsule  Commonly known as:  PRILOSEC  TAKE 1 CAPSULE BY MOUTH DAILY.     potassium chloride 10 MEQ CR capsule  Commonly known as:  MICRO-K  Take 30 mEq by mouth. Reported on 03/10/2015     potassium chloride SA 20 MEQ tablet  Commonly known as:  K-DUR,KLOR-CON  Take 40 mEq by mouth as needed (when taking Furosemide or Metolazone.).  tacrolimus 0.5 MG capsule  Commonly known as:  PROGRAF  Take 0.5 mg by mouth every evening. Reported on 03/10/2015     tacrolimus 1 MG capsule  Commonly known as:  PROGRAF  Take 1 mg by mouth daily.     Testosterone 20.25 MG/1.25GM (1.62%) Gel  Commonly known as:  ANDROGEL  2 pumps daily to dry, intact skin.  Not the scrotum, palms of the hands or the soles of the feet        Allergies:  Allergies  Allergen Reactions  . Morphine Itching    Family History: Family History  Problem Relation Age of Onset  . Heart disease Father   . Diabetes Maternal Grandmother   . Breast cancer Maternal Grandmother   . Liver cancer Paternal Uncle   . Liver cancer Paternal Grandmother   . Kidney failure Father   . Prostate cancer Neg Hx     Social History:  reports that he quit smoking about 20 years ago. His smoking use included Cigars. He has never used smokeless tobacco. He reports that he does not drink alcohol or use illicit drugs.  ROS: UROLOGY Frequent Urination?: No Hard to postpone urination?: No Burning/pain with urination?: No Get up at night to urinate?: No Leakage of urine?:  No Urine stream starts and stops?: No Trouble starting stream?: No Do you have to strain to urinate?: No Blood in urine?: No Urinary tract infection?: No Sexually transmitted disease?: No Injury to kidneys or bladder?: No Painful intercourse?: No Weak stream?: No Erection problems?: No Penile pain?: No  Gastrointestinal Nausea?: No Vomiting?: No Indigestion/heartburn?: No Diarrhea?: No Constipation?: No  Constitutional Fever: No Night sweats?: No Weight loss?: No Fatigue?: No  Skin Skin rash/lesions?: No Itching?: No  Eyes Blurred vision?: No Double vision?: No  Ears/Nose/Throat Sore throat?: No Sinus problems?: No  Hematologic/Lymphatic Swollen glands?: No Easy bruising?: No  Cardiovascular Leg swelling?: Yes Chest pain?: No  Respiratory Cough?: No Shortness of breath?: No  Endocrine Excessive thirst?: No  Musculoskeletal Back pain?: No Joint pain?: No  Neurological Headaches?: No Dizziness?: No  Psychologic Depression?: No Anxiety?: No  Physical Exam: BP 157/80 mmHg  Pulse 61  Ht _0  (1.803 m)  Wt 273 lb 6.4 oz (124.013 kg)  BMI 38.15 kg/m2  GU: No CVA tenderness.  No bladder fullness or masses.  Patient with circumcised phallus.   Urethral meatus is patent.  No penile discharge. No penile lesions or rashes. Scrotum without lesions, cysts, rashes and/or edema.  Testicles are located scrotally bilaterally. No masses are appreciated in the testicles. Left and right epididymis are normal. Rectal: Patient with  normal sphincter tone. Anus and perineum without scarring or rashes. No rectal masses are appreciated. Prostate is approximately 50 grams, no nodules are appreciated. Seminal vesicles are normal.   Laboratory Data: Lab Results  Component Value Date   WBC 4.5 10/14/2014   HGB 13.0 10/14/2014   HCT 40.1 03/07/2015   MCV 83.1 10/14/2014   PLT 137* 10/14/2014    Lab Results  Component Value Date   CREATININE 0.90 03/07/2015     Lab Results  Component Value Date   PSA 0.7 03/07/2015   PSA 1.0 09/23/2014   PSA 0.68 10/20/2012    Lab Results  Component Value Date   TESTOSTERONE 470 03/07/2015    Lab Results  Component Value Date   HGBA1C 6.7 12/06/2014     Assessment & Plan:    1. Hypogonadism in male:   Patient has  not been satisfied with the Testopel insertion therapy.  We discussed other treatment modalities, such as gels, patches, nasal spray, buccal mucosal system and injections.  We also discussed Clomid.  He would like to try a gel.  I sent a prescription for androgen gel to his pharmacy.  He will start that medication applying 2 pumps daily to his skin during clothing over the application sites to prevent any transference to women of childbearing age, children and teenagers. He will return in 1 month's time for a serum testosterone draw.  - Testosterone (ANDROGEL) 20.25 MG/1.25GM (1.62%) GEL; 2 pumps daily to dry, intact skin.  Not the scrotum, palms of the hands or the soles of the feet  Dispense: 1.25 g; Refill: 5  2. Erectile dysfunction:   SHIM score is 14.  He is doing well with the Cialis 20 mg on demand dosing. He will continue this medication. He'll return in 6 months for an SHIM score and exam.  3. BPH with LUTS:   Patient's IPSS score is 8/2.  We will continue to monitor.  He will follow up in 6 months for a PSA, exam and an IPSS score.      Return in about 1 month (around 04/10/2015) for serum testosterone only.  Zara Council, [facility] [address] [place], [place] [address] [phone]

## 2015-03-14 ENCOUNTER — Ambulatory Visit: Payer: Self-pay

## 2015-03-15 ENCOUNTER — Telehealth: Payer: Self-pay

## 2015-03-15 NOTE — Telephone Encounter (Signed)
-----  Message from Nori Riis, PA-C sent at 03/15/2015  1:53 PM EST ----- Labs are good.  We will see him in one month for a testosterone draw.

## 2015-03-15 NOTE — Telephone Encounter (Signed)
Spoke with pt in reference to lab results. Pt voiced understanding.

## 2015-03-22 ENCOUNTER — Other Ambulatory Visit: Payer: Self-pay

## 2015-03-22 ENCOUNTER — Telehealth: Payer: Self-pay | Admitting: Urology

## 2015-03-22 ENCOUNTER — Telehealth: Payer: Self-pay | Admitting: *Deleted

## 2015-03-22 VITALS — BP 170/84 | HR 68 | Ht 66.0 in | Wt 280.2 lb

## 2015-03-22 DIAGNOSIS — Z794 Long term (current) use of insulin: Secondary | ICD-10-CM

## 2015-03-22 DIAGNOSIS — Z940 Kidney transplant status: Secondary | ICD-10-CM

## 2015-03-22 DIAGNOSIS — E118 Type 2 diabetes mellitus with unspecified complications: Secondary | ICD-10-CM

## 2015-03-22 LAB — POCT GLYCOSYLATED HEMOGLOBIN (HGB A1C): HEMOGLOBIN A1C: 7

## 2015-03-22 NOTE — Telephone Encounter (Signed)
Pt called & LM on VM.  Wants to speak to the nurse re his Androgel Rx.  Please call.

## 2015-03-22 NOTE — Telephone Encounter (Signed)
Pt c/o BP issue: STAT if pt c/o blurred vision, one-sided weakness or slurred speech  1. What are your last 5 BP readings? 03/20/15 9:30am 155/89, 03/18/15 2:00pm 162/92, 03/22/15 1:30pm 170/88  2. Are you having any other symptoms (ex. Dizziness, headache, blurred vision, passed out)? No  3. What is your BP issue? Worried its too high with his heart issues.

## 2015-03-23 MED ORDER — SACUBITRIL-VALSARTAN 97-103 MG PO TABS: 1.0000 | ORAL_TABLET | Freq: Two times a day (BID) | ORAL | Status: DC

## 2015-03-23 NOTE — Telephone Encounter (Signed)
Can you take a make a recommendation since [name] is out of the office? He outlined a potential plan at [facility] 02/03/15, but didn't get specific.

## 2015-03-23 NOTE — Telephone Encounter (Signed)
BP is up.  Please have him stop lisinopril and call in Rx for entresto 97/103 mg, 1 PO BID to start 36 hrs after last lisinopril dose.  Assuming that he took lisinopril this AM, he would start entresto tomorrow evening.  He should continue to follow BP as it is likely that we may increase entresto further in the next 2-3 wks.  F/U bmet one week after starting entresto given his h/o renal transplant.  Murray Hodgkins, NP

## 2015-03-23 NOTE — Telephone Encounter (Signed)
Spoke w/ pt.  Advised him of Chris's recommendation. He verbalizes understanding and will come in next Friday 03/31/15 for labs.

## 2015-03-23 NOTE — Patient Outreach (Signed)
[facility]) Care Management  [place]  03/23/2015   [name] 1960/05/28 [id number]  Subjective: Met with patient for his [facility] to Wellness visit.  He did not bring his blood sugar meter with him but reports fasting blood sugars less than 1107m/dl and 2 hour post prandial blood sugars 150-1656mdl and one at 170110ml. His A1C POCT in my office was 7.0%  He is up 10lbs from his last visit with me, 3 months ago.  He reports having eaten a little more than usual over the Christmas holidays. He complains of elevated BP which he started taking last week when his BP was slightly elevated at his doctor's visit. He tells me it is consistently 150254-270astolic and 78-62-37astolic. He is currently not exercising and is not following the dietary guidelines or taking in protein as recommended when he had his gastric bypass.   Objective:  Filed Vitals:   03/22/15 1317  BP: 170/84  Pulse: 68  Height: 1.676 m (_0 )  Weight: 280 lb 3.2 oz (127.098 kg)  SpO2: 96%   POCT A1C 7.0%  Current Medications:  Current Outpatient Prescriptions  Medication Sig Dispense Refill  . albuterol (PROAIR HFA) 108 (90 BASE) MCG/ACT inhaler Inhale 2 puffs into the lungs 4 (four) times daily as needed for wheezing or shortness of breath (cough). (Patient taking differently: Inhale 2 puffs into the lungs 4 (four) times daily as needed for wheezing or shortness of breath. ) 1 Inhaler 3  . amLODipine (NORVASC) 5 MG tablet Take 1 tablet (5 mg total) by mouth daily. 90 tablet 3  . apixaban (ELIQUIS) 5 MG TABS tablet Take 1 tablet (5 mg total) by mouth 2 (two) times daily. 60 tablet 6  . beclomethasone (QVAR) 80 MCG/ACT inhaler Inhale 2 puffs into the lungs 2 (two) times daily. (Patient taking differently: Inhale 2 puffs into the lungs 2 (two) times daily as needed (for shortness of breath). ) 3 Inhaler 3  . buPROPion (WELLBUTRIN) 75 MG tablet Take 1 tablet (75 mg total) by mouth 2 (two) times  daily. 180 tablet 3  . CIALIS 20 MG tablet Take 1/2 to 1 tablet every other day as needed for erectile dysfunction. (Patient taking differently: Take 10-20 mg by mouth as needed for erectile dysfunction. ) 6 tablet 11  . colchicine 0.6 MG tablet Take 1 tablet (0.6 mg total) by mouth 2 (two) times daily as needed. (Patient taking differently: Take 0.6 mg by mouth 2 (two) times daily as needed (for gout flares). ) 60 tablet 2  . furosemide (LASIX) 40 MG tablet Take 40 mg by mouth as needed for edema.     . gMarland Kitchenucose blood (BAYER CONTOUR NEXT TEST) test strip Use as instructed three times daily    . insulin glargine (LANTUS) 100 UNIT/ML injection Inject 8 Units into the skin at bedtime.     . Multiple Vitamin (MULTIVITAMIN WITH MINERALS) TABS tablet Take 1 tablet by mouth daily.    . mycophenolate (MYFORTIC) 180 MG EC tablet Take 720 mg by mouth 2 (two) times daily.     . oMarland Kitcheneprazole (PRILOSEC) 20 MG capsule TAKE 1 CAPSULE BY MOUTH DAILY. 90 capsule 3  . potassium chloride (MICRO-K) 10 MEQ CR capsule Take 30 mEq by mouth. Reported on 03/10/2015    . tacrolimus (PROGRAF) 0.5 MG capsule Take 0.5 mg by mouth every evening. Reported on 03/10/2015    . tacrolimus (PROGRAF) 1 MG capsule Take 1 mg by mouth daily.     .Marland Kitchen  aspirin EC 81 MG tablet Reported on 03/22/2015    . HYDROcodone-acetaminophen (NORCO/VICODIN) 5-325 MG per tablet Take 1-2 tablets by mouth every 6 (six) hours as needed for moderate pain. (Patient not taking: Reported on 03/10/2015) 6 tablet 0  . insulin aspart (NOVOLOG) 100 UNIT/ML injection Inject into the skin. Reported on 03/22/2015    . metolazone (ZAROXOLYN) 2.5 MG tablet Take 2.5 mg by mouth as needed (for excess fluid). Reported on 03/22/2015    . mometasone (NASONEX) 50 MCG/ACT nasal spray Place 2 sprays into the nose at bedtime. Reported on 03/22/2015    . potassium chloride SA (K-DUR,KLOR-CON) 20 MEQ tablet Take 40 mEq by mouth as needed (when taking Furosemide or Metolazone.).  Reported on 03/22/2015    . sacubitril-valsartan (ENTRESTO) 97-103 MG Take 1 tablet by mouth 2 (two) times daily. 60 tablet 6  . Testosterone (ANDROGEL) 20.25 MG/1.25GM (1.62%) GEL 2 pumps daily to dry, intact skin.  Not the scrotum, palms of the hands or the soles of the feet (Patient not taking: Reported on 03/22/2015) 1.25 g 5   No current facility-administered medications for this visit.    Functional Status:  In your present state of health, do you have any difficulty performing the following activities: 03/22/2015 12/06/2014  Hearing? N N  Vision? N N  Difficulty concentrating or making decisions? N N  Walking or climbing stairs? N N  Dressing or bathing? N N  Doing errands, shopping? N N    Fall/Depression Screening: PHQ 2/9 Scores 12/06/2014 10/12/2014  PHQ - 2 Score 0 0    Assessment: Elevated BP, weight and blood sugars as a result of excessive dietary intake and lack of exercise (not currently doing any). He reports that he wants to be 235 lbs by June 2017 which will be unlikely if he continues on his current path.  Plan:  [facility] CM Care Plan Problem One        Most Recent Value   Care Plan Problem One  Potential for long term complications    Role Documenting the Problem One  Care Management [place] for Problem One  Active   [facility] Long Term Goal (31-90 days)  Patient will maintain A1C ,7% when it is rechecked in 3 months.    [facility] Long Term Goal Start Date  03/22/15   Interventions for Problem One Long Term Goal  1. exercise 3 times a week for at least 30 minutes  2. track dietary intake on a phone app 3. call MD about elevated BP     Follow up in 3 months   [name], RN, [place], [place], [place]:  [phone]  Fax:  [phone] E-mail: [name] [address] [place], [place]  [address]

## 2015-03-24 NOTE — Telephone Encounter (Signed)
Spoke with pt in reference to androgel. Made pt aware PA will be done when the PA is received.

## 2015-03-26 DIAGNOSIS — B259 Cytomegaloviral disease, unspecified: Secondary | ICD-10-CM

## 2015-03-26 HISTORY — DX: Cytomegaloviral disease, unspecified: B25.9

## 2015-03-28 DIAGNOSIS — G4733 Obstructive sleep apnea (adult) (pediatric): Secondary | ICD-10-CM | POA: Diagnosis not present

## 2015-03-28 DIAGNOSIS — E662 Morbid (severe) obesity with alveolar hypoventilation: Secondary | ICD-10-CM | POA: Diagnosis not present

## 2015-03-29 ENCOUNTER — Telehealth: Payer: Self-pay | Admitting: Urology

## 2015-03-29 ENCOUNTER — Telehealth: Payer: Self-pay | Admitting: *Deleted

## 2015-03-29 NOTE — Telephone Encounter (Signed)
Pt is calling needing [name] to prescribe another BP Medicaine for the one he is now is not covered under insurance  Please advise

## 2015-03-29 NOTE — Telephone Encounter (Signed)
Pt called and LM on VM.  Wants to know if he has gotten Biochemist, clinical for Androgel yet?  Please call.

## 2015-03-30 NOTE — Telephone Encounter (Signed)
Patient went to the pharmacy to have Entresto filled, he can't afford the medication. Please advise what other medication he can take instead of Entresto.

## 2015-03-30 NOTE — Telephone Encounter (Signed)
Please have him resume lisinopril for now.  Please work with entresto rep to see if we can get the medication covered.

## 2015-03-31 ENCOUNTER — Other Ambulatory Visit: Payer: Medicare Other

## 2015-03-31 NOTE — Telephone Encounter (Signed)
Patient needs prior authorization for [facility]. Thanks!

## 2015-03-31 NOTE — Telephone Encounter (Signed)
LMOM- PA has been done and waiting for insurance co to give results.

## 2015-03-31 NOTE — Patient Instructions (Signed)
Error

## 2015-04-04 ENCOUNTER — Telehealth: Payer: Self-pay

## 2015-04-04 NOTE — Telephone Encounter (Signed)
Prior auth sent to [place]. Patient informed.

## 2015-04-04 NOTE — Telephone Encounter (Signed)
Prior auth for [employer] 97-103 sent to [place].

## 2015-04-05 NOTE — Telephone Encounter (Signed)
PA for [facility] sent to [employer]

## 2015-04-06 NOTE — Telephone Encounter (Signed)
Prior auth for [facility] sent

## 2015-04-14 ENCOUNTER — Other Ambulatory Visit: Payer: Self-pay | Admitting: Cardiovascular Disease

## 2015-04-28 DIAGNOSIS — E662 Morbid (severe) obesity with alveolar hypoventilation: Secondary | ICD-10-CM | POA: Diagnosis not present

## 2015-04-28 DIAGNOSIS — G4733 Obstructive sleep apnea (adult) (pediatric): Secondary | ICD-10-CM | POA: Diagnosis not present

## 2015-05-11 ENCOUNTER — Ambulatory Visit: Payer: 59 | Admitting: Internal Medicine

## 2015-05-22 ENCOUNTER — Other Ambulatory Visit: Payer: Self-pay

## 2015-05-22 ENCOUNTER — Ambulatory Visit (INDEPENDENT_AMBULATORY_CARE_PROVIDER_SITE_OTHER): Payer: 59 | Admitting: Internal Medicine

## 2015-05-22 ENCOUNTER — Encounter: Payer: Self-pay | Admitting: Internal Medicine

## 2015-05-22 ENCOUNTER — Other Ambulatory Visit: Payer: Self-pay | Admitting: *Deleted

## 2015-05-22 VITALS — BP 138/82 | HR 62 | Temp 98.4°F | Wt 275.0 lb

## 2015-05-22 DIAGNOSIS — Z794 Long term (current) use of insulin: Secondary | ICD-10-CM

## 2015-05-22 DIAGNOSIS — D849 Immunodeficiency, unspecified: Secondary | ICD-10-CM

## 2015-05-22 DIAGNOSIS — E662 Morbid (severe) obesity with alveolar hypoventilation: Secondary | ICD-10-CM

## 2015-05-22 DIAGNOSIS — I5022 Chronic systolic (congestive) heart failure: Secondary | ICD-10-CM

## 2015-05-22 DIAGNOSIS — E1121 Type 2 diabetes mellitus with diabetic nephropathy: Secondary | ICD-10-CM

## 2015-05-22 DIAGNOSIS — I481 Persistent atrial fibrillation: Secondary | ICD-10-CM

## 2015-05-22 DIAGNOSIS — Z6841 Body Mass Index (BMI) 40.0 and over, adult: Secondary | ICD-10-CM

## 2015-05-22 DIAGNOSIS — I4819 Other persistent atrial fibrillation: Secondary | ICD-10-CM

## 2015-05-22 DIAGNOSIS — F3340 Major depressive disorder, recurrent, in remission, unspecified: Secondary | ICD-10-CM

## 2015-05-22 DIAGNOSIS — D899 Disorder involving the immune mechanism, unspecified: Secondary | ICD-10-CM

## 2015-05-22 DIAGNOSIS — N186 End stage renal disease: Secondary | ICD-10-CM

## 2015-05-22 MED ORDER — MOMETASONE FUROATE 50 MCG/ACT NA SUSP: 2.0000 | Freq: Every day | NASAL | Status: DC

## 2015-05-22 NOTE — Assessment & Plan Note (Addendum)
Seems to have good control Lab Results  Component Value Date   HGBA1C 7.0 03/22/2015

## 2015-05-22 NOTE — Assessment & Plan Note (Signed)
Monitored by his transplant team--- [facility]

## 2015-05-22 NOTE — Progress Notes (Signed)
Subjective:    Patient ID: [name], male    DOB: May 22, 1960, 55 y.o.   MRN: [id number]  HPI Here for follow up of chronic medical conditions  Tried androgel from urologist Made his sugars go way up Plans to check in with Ms Ernestine Conrad for alternatives Off for 3 weeks now Up to 220 but usually under 120 fasting off it No hypoglycemic spells that were severe----2 mild events, he just has to eat Only taking the novolog when sugars were high  Kidney is okay Seeing nephrologist tomorrow Sees some foam in urine  No chest pain  No palpitations No dizziness or syncope Back in school---so not as much exercise (but tries to walk 3 miles when he can)  Mood has been good Occasional down feelings Stress with school-- will graduate in July. (Associate degree in [employer])  Current Outpatient Prescriptions on File Prior to Visit  Medication Sig Dispense Refill  . amLODipine (NORVASC) 5 MG tablet Take 1 tablet (5 mg total) by mouth daily. 90 tablet 3  . buPROPion (WELLBUTRIN) 75 MG tablet Take 1 tablet (75 mg total) by mouth 2 (two) times daily. 180 tablet 3  . ELIQUIS 5 MG TABS tablet TAKE 1 TABLET BY MOUTH 2 TIMES DAILY. 60 tablet 3  . furosemide (LASIX) 40 MG tablet Take 40 mg by mouth as needed for edema.     Marland Kitchen glucose blood (BAYER CONTOUR NEXT TEST) test strip Use as instructed three times daily    . insulin aspart (NOVOLOG) 100 UNIT/ML injection Inject into the skin. Reported on 03/22/2015    . insulin glargine (LANTUS) 100 UNIT/ML injection Inject 8 Units into the skin at bedtime.     . metolazone (ZAROXOLYN) 2.5 MG tablet Take 2.5 mg by mouth as needed (for excess fluid). Reported on 03/22/2015    . mometasone (NASONEX) 50 MCG/ACT nasal spray Place 2 sprays into the nose at bedtime. Reported on 03/22/2015    . Multiple Vitamin (MULTIVITAMIN WITH MINERALS) TABS tablet Take 1 tablet by mouth daily.    . mycophenolate (MYFORTIC) 180 MG EC tablet Take 720 mg by mouth 2 (two)  times daily.     Marland Kitchen omeprazole (PRILOSEC) 20 MG capsule TAKE 1 CAPSULE BY MOUTH DAILY. 90 capsule 3  . potassium chloride (MICRO-K) 10 MEQ CR capsule Take 30 mEq by mouth. Reported on 03/10/2015    . potassium chloride SA (K-DUR,KLOR-CON) 20 MEQ tablet Take 40 mEq by mouth as needed (when taking Furosemide or Metolazone.). Reported on 03/22/2015    . tacrolimus (PROGRAF) 0.5 MG capsule Take 0.5 mg by mouth every evening. Reported on 03/10/2015    . tacrolimus (PROGRAF) 1 MG capsule Take 1 mg by mouth daily.      No current facility-administered medications on file prior to visit.    Allergies  Allergen Reactions  . Morphine Itching    Past Medical History  Diagnosis Date  . Hypertension   . Hyperlipidemia     low since renal failure  . Kidney transplant status, cadaveric 2012  . Depression   . GERD (gastroesophageal reflux disease)   . Diabetes mellitus   . Gout   . Asthma   . ESRD (end stage renal disease) ([place]) 2005    Dialysis then renal transplant  . CAD (coronary artery disease) 2005    had CABG  . CHF (congestive heart failure) ([place])   . Pneumonia   . Wears glasses   . Sleep apnea  bipap with oxygen 2 l  . Atrial fibrillation ([place])   . Trigger finger   . Amnestic MCI (mild cognitive impairment with memory loss)   . Dyspnea   . Benign neoplasm of colon   . Diverticulosis   . Chronic venous stasis dermatitis of both lower extremities   . Purpura ([place])   . Erectile dysfunction   . Obesity   . Atrial fibrillation ([place]) 2016    Past Surgical History  Procedure Laterality Date  . Kidney transplant  09/13/2010    cadaver--at [facility]  . Tympanic membrane repair  1/12    left  . Dg angio av shunt*l*      right and left upper arms  . Fasciotomy  03/03/2012    Procedure: FASCIOTOMY;  Surgeon: [name], MD;  Location: [place];  Service: Orthopedics;  Laterality: Right;  FASCIOTOMY RIGHT SMALL FINGER  . Cardiac catheterization      [facility]  .  Coronary artery bypass graft  2005    X 4  . Colonoscopy with propofol N/A 04/22/2013    Procedure: COLONOSCOPY WITH PROPOFOL;  Surgeon: [name], MD;  Location: [facility];  Service: Endoscopy;  Laterality: N/A;  . Fasciotomy Left 08/17/2013    Procedure: FASCIOTOMY LEFT RING;  Surgeon: [name], MD;  Location: [place];  Service: Orthopedics;  Laterality: Left;  . Bariatric surgery    . Vasectomy      Family History  Problem Relation Age of Onset  . Heart disease Father   . Diabetes Maternal Grandmother   . Breast cancer Maternal Grandmother   . Liver cancer Paternal Uncle   . Liver cancer Paternal Grandmother   . Kidney failure Father   . Prostate cancer Neg Hx     Social History   Social History  . Marital Status: Married    Spouse Name: N/A  . Number of Children: 2  . Years of Education: N/A   Occupational History  . 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 Smoking status: Former Smoker    Types: Cigars    Quit date: 09/02/1994  . Smokeless tobacco: Never Used     Comment: cigar- once in a while  . Alcohol Use: 0.0 oz/week    0 Standard drinks or equivalent per week     Comment: occasional- 3 in the past year  . Drug Use: No  . Sexual Activity: Not on file   Other Topics Concern  . Not on file   Social History Narrative   In school for culinary arts--- will finish 12/16      Has living will   Wife is health care POA   Would accept resuscitation attempts   Hasn't considered tube feedings   Review of Systems Sleeps well --uses CPAP to keep oxygen up and CO2 down Appetite is good--will forget meals at times if busy with school Weight fairly  Allergies are acting up already    Objective:   Physical Exam  Constitutional: He appears well-developed. No distress.  Neck: Normal range of motion. Neck supple.  Cardiovascular: Normal rate, normal heart sounds and intact distal  pulses.   No murmur heard. irregular  Pulmonary/Chest: Effort normal and breath sounds normal. No respiratory distress. He has no rales.  Musculoskeletal: He exhibits no edema or tenderness.  Lymphadenopathy:    He has no cervical adenopathy.  Skin:  No foot lesions  Psychiatric: He has a normal mood and affect. His behavior is normal.          Assessment & Plan:

## 2015-05-22 NOTE — Assessment & Plan Note (Signed)
Doing well with renal transplant

## 2015-05-22 NOTE — Assessment & Plan Note (Signed)
Neutral fluid status No med changes needed

## 2015-05-22 NOTE — Progress Notes (Signed)
Pre visit review using our clinic review tool, if applicable. No additional management support is needed unless otherwise documented below in the visit note.

## 2015-05-22 NOTE — Patient Outreach (Signed)
[facility]) Care Management  05/22/2015  [name] Oct 11, 1960 [id number]  Spoke to [name] on the phone to schedule a follow up visit. He tells me his testosterone was causing his blood sugars to be elevated so he stopped taking it.  Reports blood sugars 200-234m /dl.  He is on his way to his doctor's office and will discuss with the MD.  Follow up scheduled with me for 06/20/15 @ 1:30pm- notified by e-mail.   JGentry Fitz RN, BA, [place] Dial:  [phone] Fax:  [phone]-mail: [email or web address] [address] [place]  [address]

## 2015-05-22 NOTE — Assessment & Plan Note (Signed)
Does well with CPAP

## 2015-05-22 NOTE — Assessment & Plan Note (Signed)
Lost 50# but now stabilized since bariatric surgery

## 2015-05-22 NOTE — Assessment & Plan Note (Signed)
Good rate control On eliquis

## 2015-05-22 NOTE — Assessment & Plan Note (Signed)
Only occasional bad days Will continue the med

## 2015-05-23 DIAGNOSIS — Z940 Kidney transplant status: Secondary | ICD-10-CM | POA: Diagnosis not present

## 2015-05-23 DIAGNOSIS — I10 Essential (primary) hypertension: Secondary | ICD-10-CM | POA: Diagnosis not present

## 2015-05-23 DIAGNOSIS — Z79899 Other long term (current) drug therapy: Secondary | ICD-10-CM | POA: Diagnosis not present

## 2015-05-23 DIAGNOSIS — D899 Disorder involving the immune mechanism, unspecified: Secondary | ICD-10-CM | POA: Diagnosis not present

## 2015-05-23 DIAGNOSIS — I2510 Atherosclerotic heart disease of native coronary artery without angina pectoris: Secondary | ICD-10-CM | POA: Diagnosis not present

## 2015-05-23 DIAGNOSIS — E669 Obesity, unspecified: Secondary | ICD-10-CM | POA: Diagnosis not present

## 2015-05-23 DIAGNOSIS — Z4822 Encounter for aftercare following kidney transplant: Secondary | ICD-10-CM | POA: Diagnosis not present

## 2015-05-23 DIAGNOSIS — Z9884 Bariatric surgery status: Secondary | ICD-10-CM | POA: Diagnosis not present

## 2015-05-23 DIAGNOSIS — E119 Type 2 diabetes mellitus without complications: Secondary | ICD-10-CM | POA: Diagnosis not present

## 2015-05-23 DIAGNOSIS — Z87891 Personal history of nicotine dependence: Secondary | ICD-10-CM | POA: Diagnosis not present

## 2015-05-23 DIAGNOSIS — Z6838 Body mass index (BMI) 38.0-38.9, adult: Secondary | ICD-10-CM | POA: Diagnosis not present

## 2015-05-23 DIAGNOSIS — Z794 Long term (current) use of insulin: Secondary | ICD-10-CM | POA: Diagnosis not present

## 2015-05-25 DIAGNOSIS — Z940 Kidney transplant status: Secondary | ICD-10-CM | POA: Diagnosis not present

## 2015-05-26 DIAGNOSIS — E662 Morbid (severe) obesity with alveolar hypoventilation: Secondary | ICD-10-CM | POA: Diagnosis not present

## 2015-05-26 DIAGNOSIS — G4733 Obstructive sleep apnea (adult) (pediatric): Secondary | ICD-10-CM | POA: Diagnosis not present

## 2015-06-15 DIAGNOSIS — Z940 Kidney transplant status: Secondary | ICD-10-CM | POA: Diagnosis not present

## 2015-06-15 DIAGNOSIS — R809 Proteinuria, unspecified: Secondary | ICD-10-CM | POA: Diagnosis not present

## 2015-06-20 ENCOUNTER — Ambulatory Visit: Payer: Self-pay

## 2015-06-23 DIAGNOSIS — Z940 Kidney transplant status: Secondary | ICD-10-CM | POA: Diagnosis not present

## 2015-06-23 DIAGNOSIS — D899 Disorder involving the immune mechanism, unspecified: Secondary | ICD-10-CM | POA: Diagnosis not present

## 2015-06-23 DIAGNOSIS — E118 Type 2 diabetes mellitus with unspecified complications: Secondary | ICD-10-CM | POA: Diagnosis not present

## 2015-06-23 DIAGNOSIS — Z794 Long term (current) use of insulin: Secondary | ICD-10-CM | POA: Diagnosis not present

## 2015-06-23 DIAGNOSIS — F329 Major depressive disorder, single episode, unspecified: Secondary | ICD-10-CM | POA: Diagnosis not present

## 2015-06-23 DIAGNOSIS — E876 Hypokalemia: Secondary | ICD-10-CM | POA: Diagnosis not present

## 2015-06-23 DIAGNOSIS — Z7901 Long term (current) use of anticoagulants: Secondary | ICD-10-CM | POA: Diagnosis not present

## 2015-06-23 DIAGNOSIS — I4891 Unspecified atrial fibrillation: Secondary | ICD-10-CM | POA: Diagnosis not present

## 2015-06-23 DIAGNOSIS — R809 Proteinuria, unspecified: Secondary | ICD-10-CM | POA: Insufficient documentation

## 2015-06-23 DIAGNOSIS — Z79899 Other long term (current) drug therapy: Secondary | ICD-10-CM | POA: Diagnosis not present

## 2015-06-23 DIAGNOSIS — I10 Essential (primary) hypertension: Secondary | ICD-10-CM | POA: Diagnosis not present

## 2015-06-23 DIAGNOSIS — I2510 Atherosclerotic heart disease of native coronary artery without angina pectoris: Secondary | ICD-10-CM | POA: Diagnosis not present

## 2015-06-23 DIAGNOSIS — Z6837 Body mass index (BMI) 37.0-37.9, adult: Secondary | ICD-10-CM | POA: Diagnosis not present

## 2015-06-23 HISTORY — DX: Proteinuria, unspecified: R80.9

## 2015-06-26 DIAGNOSIS — E662 Morbid (severe) obesity with alveolar hypoventilation: Secondary | ICD-10-CM | POA: Diagnosis not present

## 2015-06-26 DIAGNOSIS — G4733 Obstructive sleep apnea (adult) (pediatric): Secondary | ICD-10-CM | POA: Diagnosis not present

## 2015-06-29 DIAGNOSIS — D899 Disorder involving the immune mechanism, unspecified: Secondary | ICD-10-CM | POA: Diagnosis not present

## 2015-06-29 DIAGNOSIS — Z885 Allergy status to narcotic agent status: Secondary | ICD-10-CM | POA: Diagnosis not present

## 2015-06-29 DIAGNOSIS — Z6838 Body mass index (BMI) 38.0-38.9, adult: Secondary | ICD-10-CM | POA: Diagnosis not present

## 2015-06-29 DIAGNOSIS — N186 End stage renal disease: Secondary | ICD-10-CM | POA: Diagnosis not present

## 2015-06-29 DIAGNOSIS — Z01818 Encounter for other preprocedural examination: Secondary | ICD-10-CM | POA: Diagnosis not present

## 2015-06-29 DIAGNOSIS — R809 Proteinuria, unspecified: Secondary | ICD-10-CM | POA: Diagnosis not present

## 2015-06-29 DIAGNOSIS — Z9884 Bariatric surgery status: Secondary | ICD-10-CM | POA: Diagnosis not present

## 2015-06-29 DIAGNOSIS — Z794 Long term (current) use of insulin: Secondary | ICD-10-CM | POA: Diagnosis not present

## 2015-06-29 DIAGNOSIS — G4733 Obstructive sleep apnea (adult) (pediatric): Secondary | ICD-10-CM | POA: Diagnosis not present

## 2015-06-29 DIAGNOSIS — Z4822 Encounter for aftercare following kidney transplant: Secondary | ICD-10-CM | POA: Diagnosis not present

## 2015-06-29 DIAGNOSIS — Z940 Kidney transplant status: Secondary | ICD-10-CM | POA: Diagnosis not present

## 2015-06-29 DIAGNOSIS — Z7901 Long term (current) use of anticoagulants: Secondary | ICD-10-CM | POA: Diagnosis not present

## 2015-06-29 DIAGNOSIS — I120 Hypertensive chronic kidney disease with stage 5 chronic kidney disease or end stage renal disease: Secondary | ICD-10-CM | POA: Diagnosis not present

## 2015-06-29 DIAGNOSIS — I10 Essential (primary) hypertension: Secondary | ICD-10-CM | POA: Diagnosis not present

## 2015-06-29 DIAGNOSIS — E872 Acidosis: Secondary | ICD-10-CM | POA: Diagnosis not present

## 2015-06-29 DIAGNOSIS — I2510 Atherosclerotic heart disease of native coronary artery without angina pectoris: Secondary | ICD-10-CM | POA: Diagnosis not present

## 2015-06-29 DIAGNOSIS — E118 Type 2 diabetes mellitus with unspecified complications: Secondary | ICD-10-CM | POA: Diagnosis not present

## 2015-06-29 DIAGNOSIS — E1122 Type 2 diabetes mellitus with diabetic chronic kidney disease: Secondary | ICD-10-CM | POA: Diagnosis not present

## 2015-06-29 DIAGNOSIS — Z79899 Other long term (current) drug therapy: Secondary | ICD-10-CM | POA: Diagnosis not present

## 2015-06-29 DIAGNOSIS — I4891 Unspecified atrial fibrillation: Secondary | ICD-10-CM | POA: Diagnosis not present

## 2015-07-03 ENCOUNTER — Other Ambulatory Visit: Payer: Self-pay

## 2015-07-03 NOTE — Patient Outreach (Signed)
[facility]) Care Management  07/03/2015  Breck KHALE NIGH 09-19-1960 [id number]   I have sent Corky a message this morning to reschedule his missed appointment on 06/20/15 within the next 2 weeks.   [name], RN, BA, [name], [place] Direct Dial:  [phone]  Fax:  [phone] E-mail: [name] [address], [place], [place]  [address]

## 2015-07-10 NOTE — Patient Outreach (Signed)
[facility]) Care Management  07/10/2015  [name] 11-18-1960 [id number]     Dear [name],  My records indicate you do not have an upcoming appointment scheduled with me. Your last appointment was 03/22/15.  We have attempted to contact you, but have been unsuccessful in reaching you regarding your Link to Wellness Program in which you are enrolled. In order to avoid cancellation of programs and benefits including reduced co-pays on qualifying medications and supplies, please call [phone] (toll free) within ten (10 business days) of this letter to schedule an appointment.  Once cancellation and termination of your Link To Wellness Program benefits have occurred, you must wait three (3) months before you are eligible for renewal.  If you have questions, or feel that you have received this letter in error, please contact me at [phone] or e-mail [email or web address].  Sincerely,   [name], RN, BA, [place], [place] Direct Dial: [phone]  Fax: [phone] E-mail: [name] [address] [place], [place]    Above letter sent to the patient.

## 2015-07-18 ENCOUNTER — Other Ambulatory Visit: Payer: Self-pay

## 2015-07-18 VITALS — BP 150/72 | Ht 71.0 in | Wt 261.1 lb

## 2015-07-18 DIAGNOSIS — Z794 Long term (current) use of insulin: Secondary | ICD-10-CM

## 2015-07-18 DIAGNOSIS — E118 Type 2 diabetes mellitus with unspecified complications: Secondary | ICD-10-CM

## 2015-07-18 NOTE — Patient Outreach (Signed)
[facility]) Care Management  [place]  07/18/2015   [name] March 24, 1961 [id number]  Subjective: Patient comes for his Link to Wellness diabetes visit. When I spoke to him in February, he complained of elevated blood sugars related to testosterone which he discussed with his MD in March and he subsequently came off of it. Today he reports that fasting blood sugars are still 170-180 mg/dl and post prandial blood sugars are 230-240 mg/dl. He tells me he's taking 10 units of Lantus q night and Novolog 2-3 units tid with meals but when pressed further he admits to only taking Novolog insulin with a meal if he checks his blood sugar, which is sometimes once a day.  He has dropped nearly 20 lbs since March 22, 2015.  He attributes this to stress and working out at the gym (4x/week for 150 minutes) but his A1C is 10.2% (POCT) . Patient denies SOB or chest pain.   Objective:  Filed Vitals:   07/18/15 1500  BP: 150/72  Height: 1.803 m (_0 )  Weight: 261 lb 1.6 oz (118.434 kg)   POCT 10.2%  Encounter Medications:  Outpatient Encounter Prescriptions as of 07/18/2015  Medication Sig Note  . amLODipine (NORVASC) 5 MG tablet Take 1 tablet (5 mg total) by mouth daily.   Marland Kitchen buPROPion (WELLBUTRIN) 75 MG tablet Take 1 tablet (75 mg total) by mouth 2 (two) times daily.   Marland Kitchen ELIQUIS 5 MG TABS tablet TAKE 1 TABLET BY MOUTH 2 TIMES DAILY.   . furosemide (LASIX) 40 MG tablet Take 40 mg by mouth as needed for edema. Taking q 2 days   . glucose blood (BAYER CONTOUR NEXT TEST) test strip Use as instructed three times daily 03/10/2015: Received from: [facility]  . insulin aspart (NOVOLOG) 100 UNIT/ML injection Inject 2-3 Units into the skin 3 (three) times daily with meals. Reported on 03/22/2015 03/10/2015: Received from: [place]  . insulin glargine (LANTUS) 100 UNIT/ML injection Inject 10 Units into the skin at bedtime.    . metolazone  (ZAROXOLYN) 2.5 MG tablet Take 2.5 mg by mouth as needed (for excess fluid). Reported on 03/22/2015   . mometasone (NASONEX) 50 MCG/ACT nasal spray Place 2 sprays into the nose at bedtime. Reported on 03/22/2015   . Multiple Vitamin (MULTIVITAMIN WITH MINERALS) TABS tablet Take 1 tablet by mouth daily.   . mycophenolate (MYFORTIC) 180 MG EC tablet Take 720 mg by mouth 2 (two) times daily.    Marland Kitchen omeprazole (PRILOSEC) 20 MG capsule TAKE 1 CAPSULE BY MOUTH DAILY.   Marland Kitchen potassium chloride (MICRO-K) 10 MEQ CR capsule Take 30 mEq by mouth. Reported on 03/10/2015 03/10/2015: Received from: [facility]  . tacrolimus (PROGRAF) 0.5 MG capsule Take 0.5 mg by mouth 2 (two) times daily. Reported on 03/10/2015   . potassium chloride SA (K-DUR,KLOR-CON) 20 MEQ tablet Take 40 mEq by mouth as needed (when taking Furosemide or Metolazone.). Reported on 07/18/2015   . tacrolimus (PROGRAF) 1 MG capsule Take 1 mg by mouth daily. Reported on 07/18/2015    No facility-administered encounter medications on file as of 07/18/2015.    Functional Status:  In your present state of health, do you have any difficulty performing the following activities: 07/18/2015 03/22/2015  Hearing? N N  Vision? Y N  Difficulty concentrating or making decisions? Y N  Walking or climbing stairs? N N  Dressing or bathing? N N  Doing errands, shopping? N  N    Fall/Depression Screening: PHQ 2/9 Scores 07/18/2015 12/06/2014 10/12/2014  PHQ - 2 Score 0 0 0    Assessment: Patient did not bring in his blood sugar meter- he reports elevated blood sugars but I cannot determine how often he's really checking or the accuracy of what he reports. . He reports he's taking in very little food.  He eats peanut butter crackers before going to the gym 4x/week. Excessive stress related to school. We reviewed the need for regular blood sugar checks, taking insulin regularly, and adequate dietary intake. I discussed the risk of elevated blood  sugars.   Plan:  Check blood sugar 3x/day every day- take Novolog as ordered based on a sliding scale.   Document blood sugars and units of insulin taken.  Take Lantus 10 units qhs.  Follow up with Dr. Gabriel Carina - take CBG and meter with you.    [name], RN, BA, [place], [place] Direct Dial:  [phone]  Fax:  [phone] E-mail: [name] [address], [place], [place]  [address]

## 2015-07-19 LAB — POCT GLYCOSYLATED HEMOGLOBIN (HGB A1C): Hemoglobin A1C: 10.2

## 2015-07-26 DIAGNOSIS — G4733 Obstructive sleep apnea (adult) (pediatric): Secondary | ICD-10-CM | POA: Diagnosis not present

## 2015-07-26 DIAGNOSIS — E662 Morbid (severe) obesity with alveolar hypoventilation: Secondary | ICD-10-CM | POA: Diagnosis not present

## 2015-07-27 ENCOUNTER — Ambulatory Visit (INDEPENDENT_AMBULATORY_CARE_PROVIDER_SITE_OTHER): Payer: Medicare Other | Admitting: Internal Medicine

## 2015-07-27 ENCOUNTER — Encounter: Payer: Self-pay | Admitting: Internal Medicine

## 2015-07-27 VITALS — BP 152/86 | HR 59 | Temp 97.8°F | Wt 263.2 lb

## 2015-07-27 DIAGNOSIS — E1165 Type 2 diabetes mellitus with hyperglycemia: Secondary | ICD-10-CM

## 2015-07-27 DIAGNOSIS — L089 Local infection of the skin and subcutaneous tissue, unspecified: Secondary | ICD-10-CM

## 2015-07-27 DIAGNOSIS — Z794 Long term (current) use of insulin: Secondary | ICD-10-CM | POA: Diagnosis not present

## 2015-07-27 DIAGNOSIS — D899 Disorder involving the immune mechanism, unspecified: Secondary | ICD-10-CM

## 2015-07-27 DIAGNOSIS — D849 Immunodeficiency, unspecified: Secondary | ICD-10-CM

## 2015-07-27 MED ORDER — LEVOFLOXACIN 500 MG PO TABS: 500.0000 mg | ORAL_TABLET | Freq: Every day | ORAL | Status: DC

## 2015-07-27 MED ORDER — AMOXICILLIN-POT CLAVULANATE 875-125 MG PO TABS: 1.0000 | ORAL_TABLET | Freq: Two times a day (BID) | ORAL | Status: DC

## 2015-07-27 NOTE — Patient Instructions (Signed)
Fingertip Infection When an infection is around the nail, it is called a paronychia. When it appears over the tip of the finger, it is called a felon. These infections are due to minor injuries or cracks in the skin. If they are not treated properly, they can lead to bone infection and permanent damage to the fingernail. Incision and drainage is necessary if a pus pocket (an abscess) has formed. Antibiotics and pain medicine may also be needed. Keep your hand elevated for the next 2-3 days to reduce swelling and pain. If a pack was placed in the abscess, it should be removed in 1-2 days by your caregiver. Soak the finger in warm water for 20 minutes 4 times daily to help promote drainage. Keep the hands as dry as possible. Wear protective gloves with cotton liners. See your caregiver for follow-up care as recommended.  HOME CARE INSTRUCTIONS   Keep wound clean, dry and dressed as suggested by your caregiver.  Soak in warm salt water for fifteen minutes, four times per day for bacterial infections.  Your caregiver will prescribe an antibiotic if a bacterial infection is suspected. Take antibiotics as directed and finish the prescription, even if the problem appears to be improving before the medicine is gone.  Only take over-the-counter or prescription medicines for pain, discomfort, or fever as directed by your caregiver. SEEK IMMEDIATE MEDICAL CARE IF:  There is redness, swelling, or increasing pain in the wound.  Pus or any other unusual drainage is coming from the wound.  An unexplained oral temperature above 102 F (38.9 C) develops.  You notice a foul smell coming from the wound or dressing. MAKE SURE YOU:   Understand these instructions.  Monitor your condition.  Contact your caregiver if you are getting worse or not improving.   This information is not intended to replace advice given to you by your health care provider. Make sure you discuss any questions you have with your  health care provider.   Document Released: 04/18/2004 Document Revised: 06/03/2011 Document Reviewed: 08/29/2014 Elsevier Interactive Patient Education [employer].

## 2015-07-27 NOTE — Progress Notes (Signed)
Subjective:    Patient ID: [name], male    DOB: 12/12/1960, 55 y.o.   MRN: [id number]  HPI  Pt presents to the clinic today with c/o a laceration on his right second finger. This occurred 2 weeks ago. He has been using triple antibiotic ointment and keeping it clean with peroxide. He reports throbbing pain, drainage, and some swelling at the siteg. He denies fever, streaking, or increased swelling. PMH is significant for DM2 (last A1C 10.2) and kidney transplant for which he is taking Prograf and Cellcept.   Review of Systems  Past Medical History  Diagnosis Date  . Hypertension   . Hyperlipidemia     low since renal failure  . Kidney transplant status, cadaveric 2012  . Depression   . GERD (gastroesophageal reflux disease)   . Diabetes mellitus   . Gout   . Asthma   . ESRD (end stage renal disease) ([place]) 2005    Dialysis then renal transplant  . CAD (coronary artery disease) 2005    had CABG  . CHF (congestive heart failure) ([place])   . Pneumonia   . Wears glasses   . Sleep apnea     bipap with oxygen 2 l  . Atrial fibrillation ([place])   . Trigger finger   . Amnestic MCI (mild cognitive impairment with memory loss)   . Dyspnea   . Benign neoplasm of colon   . Diverticulosis   . Chronic venous stasis dermatitis of both lower extremities   . Purpura ([place])   . Erectile dysfunction   . Obesity   . Atrial fibrillation ([place]) 2016    Current Outpatient Prescriptions  Medication Sig Dispense Refill  . amLODipine (NORVASC) 5 MG tablet Take 1 tablet (5 mg total) by mouth daily. 90 tablet 3  . buPROPion (WELLBUTRIN) 75 MG tablet Take 1 tablet (75 mg total) by mouth 2 (two) times daily. 180 tablet 3  . ELIQUIS 5 MG TABS tablet TAKE 1 TABLET BY MOUTH 2 TIMES DAILY. 60 tablet 3  . furosemide (LASIX) 40 MG tablet Take 40 mg by mouth as needed for edema. Taking q 2 days    . glucose blood (BAYER CONTOUR NEXT TEST) test strip Use as instructed three times daily    . insulin  aspart (NOVOLOG) 100 UNIT/ML injection Inject 2-3 Units into the skin 3 (three) times daily with meals. Reported on 03/22/2015    . insulin glargine (LANTUS) 100 UNIT/ML injection Inject 10 Units into the skin at bedtime.     . metolazone (ZAROXOLYN) 2.5 MG tablet Take 2.5 mg by mouth as needed (for excess fluid). Reported on 03/22/2015    . mometasone (NASONEX) 50 MCG/ACT nasal spray Place 2 sprays into the nose at bedtime. Reported on 03/22/2015 17 g 11  . Multiple Vitamin (MULTIVITAMIN WITH MINERALS) TABS tablet Take 1 tablet by mouth daily.    . mycophenolate (MYFORTIC) 180 MG EC tablet Take 720 mg by mouth 2 (two) times daily.     Marland Kitchen omeprazole (PRILOSEC) 20 MG capsule TAKE 1 CAPSULE BY MOUTH DAILY. 90 capsule 3  . potassium chloride (MICRO-K) 10 MEQ CR capsule Take 30 mEq by mouth. Reported on 03/10/2015    . potassium chloride SA (K-DUR,KLOR-CON) 20 MEQ tablet Take 40 mEq by mouth as needed (when taking Furosemide or Metolazone.). Reported on 07/18/2015    . tacrolimus (PROGRAF) 0.5 MG capsule Take 0.5 mg by mouth 2 (two) times daily. Reported on 03/10/2015    . tacrolimus (  PROGRAF) 1 MG capsule Take 1 mg by mouth daily. Reported on 07/18/2015    . amoxicillin-clavulanate (AUGMENTIN) 875-125 MG tablet Take 1 tablet by mouth 2 (two) times daily. 20 tablet 0  . levofloxacin (LEVAQUIN) 500 MG tablet Take 1 tablet (500 mg total) by mouth daily. 10 tablet 0   No current facility-administered medications for this visit.    Allergies  Allergen Reactions  . Morphine Itching    Family History  Problem Relation Age of Onset  . Heart disease Father   . Kidney failure Father   . Kidney disease Father   . Diabetes Maternal Grandmother   . Breast cancer Maternal Grandmother   . Liver cancer Paternal Uncle   . Liver cancer Paternal Grandmother   . Prostate cancer Neg Hx     Social History   Social History  . Marital Status: Married    Spouse Name: N/A  . Number of Children: 2  . Years of  Education: N/A   Occupational History  . 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 Smoking status: Former Smoker    Types: Cigars    Quit date: 09/02/1994  . Smokeless tobacco: Never Used     Comment: cigar- once in a while  . Alcohol Use: 0.0 - 0.6 oz/week    0-1 Standard drinks or equivalent per week     Comment: occasional- 3 in the past year  . Drug Use: No  . Sexual Activity: Not on file   Other Topics Concern  . Not on file   Social History Narrative   In school for culinary arts--- will finish 12/16      Has living will   Wife is health care POA   Would accept resuscitation attempts   Hasn't considered tube feedings     Constitutional: Denies fever, chills or body aches. Respiratory: Denies difficulty breathing, shortness of breath, or cough. Cardiovascular: Denies chest pain, chest tightness, or palpitations. Musculoskeletal: Pt reports joint pain and swelling. Denies decrease in range of motion.   Skin: Some redness and draining at the site of injury. Denies lesion or ulceration.  No other specific complaints in a complete review of systems (except as listed in HPI above).     Objective:   Physical Exam  BP 152/86 mmHg  Pulse 59  Temp(Src) 97.8 F (36.6 C) (Oral)  Wt 263 lb 4 oz (119.409 kg)  SpO2 98% Wt Readings from Last 3 Encounters:  07/27/15 263 lb 4 oz (119.409 kg)  07/18/15 261 lb 1.6 oz (118.434 kg)  05/22/15 275 lb (124.739 kg)    General: Appears his stated age, obese in NAD. Skin: Partially healed laveration to the nail bed of the 2nd right finger. Half of the nail remaining, and just above the edge there's an area of redness, pus and scabbing. No current drainage noted. Some duskiness from the DIP to the tip of the finger. No redness noted proximal to the DIP and no streaking noted.  Musculoskeletal: Normal flexion and extension of the 2nd right finger.  BMET    Component Value Date/Time     NA 142 03/07/2015 0931   NA 141 06/16/2014 0911   K 3.6 03/07/2015 0931   K 3.4* 06/16/2014 0911   CL 101 03/07/2015 0931   CL 104 06/16/2014 0911   CO2 32 03/07/2015 0931   CO2 30 06/16/2014 0911   GLUCOSE 169* 03/07/2015 0931   GLUCOSE  139* 06/16/2014 0911   BUN 13 03/07/2015 0931   BUN 13 06/16/2014 0911   CREATININE 0.90 03/07/2015 0931   CREATININE 0.9 09/09/2014   CREATININE 0.83 06/16/2014 0911   CALCIUM 8.9 03/07/2015 0931   CALCIUM 9.1 06/16/2014 0911   GFRNONAA >60 03/07/2015 0931   GFRNONAA >60 06/16/2014 0911   GFRNONAA 47* 03/16/2014 0655   GFRAA >60 03/07/2015 0931   GFRAA >60 06/16/2014 0911   GFRAA 57* 03/16/2014 0655    Lipid Panel     Component Value Date/Time   CHOL 161 03/07/2015 0823   CHOL 178 09/09/2013   TRIG 88 03/07/2015 0823   HDL 38* 03/07/2015 0823   HDL 30* 09/09/2014   CHOLHDL 4.2 03/07/2015 0823   CHOLHDL 7 04/20/2012 1618   VLDL 131.0* 04/20/2012 1618   LDLCALC 105* 03/07/2015 0823   LDLCALC 92 09/09/2014    CBC    Component Value Date/Time   WBC 4.5 10/14/2014 0415   WBC 5.0 06/16/2014 0911   RBC 4.76 10/14/2014 0415   RBC 4.93 06/16/2014 0911   HGB 13.0 10/14/2014 0415   HGB 12.9* 06/16/2014 0911   HCT 40.1 03/07/2015 0823   HCT 39.6* 10/14/2014 0415   HCT 40.9 06/16/2014 0911   PLT 137* 10/14/2014 0415   PLT 148* 06/16/2014 0911   MCV 83.1 10/14/2014 0415   MCV 83 06/16/2014 0911   MCH 27.3 10/14/2014 0415   MCH 26.3 06/16/2014 0911   MCHC 32.9 10/14/2014 0415   MCHC 31.6* 06/16/2014 0911   RDW 16.4* 10/14/2014 0415   RDW 14.9* 06/16/2014 0911   LYMPHSABS 0.7* 10/13/2014 0350   LYMPHSABS 0.5* 06/16/2014 0911   MONOABS 0.5 10/13/2014 0350   MONOABS 0.5 06/16/2014 0911   EOSABS 0.1 10/13/2014 0350   EOSABS 0.1 06/16/2014 0911   BASOSABS 0.1 10/13/2014 0350   BASOSABS 0.1 06/16/2014 0911    Hgb A1C Lab Results  Component Value Date   HGBA1C 10.2 07/19/2015         Assessment & Plan:   Infected  finger:  Given history of DM2 and immunocompromise, will double cover with abx eRx sent for Levaquin 500 mg daily x 10 days eRX sent for Augmentin BID x 10 days Advised him to soak the finger in Epsom salt Return precautions given  RTC as needed or if sxs worsen or persist

## 2015-07-27 NOTE — Progress Notes (Signed)
Pre visit review using our clinic review tool, if applicable. No additional management support is needed unless otherwise documented below in the visit note.

## 2015-07-31 ENCOUNTER — Other Ambulatory Visit: Payer: Self-pay

## 2015-07-31 NOTE — Patient Outreach (Signed)
[facility]) Care Management  07/31/2015  [name] [birth year] [id number]   Sent e-mail today requesting update re: blood sugar numbers and if he has re-started one of his diabetes medications.    [name], RN, BA, [place], [place] Direct Dial:  [phone]  Fax:  [phone] E-mail: [name] [address], [place], [place]  [address]

## 2015-07-31 NOTE — Patient Outreach (Signed)
[facility]) Care Management  07/31/2015  [name] [birth year] [id number]   I sent an e-mail to [facility] requesting he send me blood sugars since our last visit when A1C was so elevated.   [name], RN, BA, [place], [place] Direct Dial:  [phone]  Fax:  [phone] E-mail: [name] [address], [place], [place]  [address]

## 2015-08-17 LAB — HM DIABETES EYE EXAM

## 2015-08-18 ENCOUNTER — Ambulatory Visit
Admission: RE | Admit: 2015-08-18 | Discharge: 2015-08-18 | Disposition: A | Discharge status: Home / Self Care | Payer: 59 | Source: Ambulatory Visit | Attending: Internal Medicine | Admitting: Internal Medicine

## 2015-08-18 ENCOUNTER — Other Ambulatory Visit: Payer: Self-pay | Admitting: Internal Medicine

## 2015-08-18 DIAGNOSIS — Z794 Long term (current) use of insulin: Secondary | ICD-10-CM | POA: Diagnosis not present

## 2015-08-18 DIAGNOSIS — E1165 Type 2 diabetes mellitus with hyperglycemia: Secondary | ICD-10-CM | POA: Diagnosis not present

## 2015-08-18 DIAGNOSIS — M7989 Other specified soft tissue disorders: Secondary | ICD-10-CM | POA: Diagnosis not present

## 2015-08-18 DIAGNOSIS — E113213 Type 2 diabetes mellitus with mild nonproliferative diabetic retinopathy with macular edema, bilateral: Secondary | ICD-10-CM | POA: Diagnosis not present

## 2015-08-18 DIAGNOSIS — Z940 Kidney transplant status: Secondary | ICD-10-CM | POA: Diagnosis not present

## 2015-08-19 DIAGNOSIS — H1131 Conjunctival hemorrhage, right eye: Secondary | ICD-10-CM | POA: Diagnosis not present

## 2015-08-23 ENCOUNTER — Other Ambulatory Visit: Payer: Self-pay

## 2015-08-23 DIAGNOSIS — H1131 Conjunctival hemorrhage, right eye: Secondary | ICD-10-CM | POA: Diagnosis not present

## 2015-08-23 NOTE — Patient Outreach (Signed)
[facility]) Care Management  08/23/2015  ENGLISH CRAIGHEAD 1961-02-27 [id number]  Patient sent me a message today and said he increased his Novolog to 10 units at night and that his numbers are 110-253m/dl.  He saw Dr. SGabriel Carinaon 08/18/15.  I have asked him to call me and clarify his meds- Dr. SJoycie Peeknotes state different medication recommendations.   JGentry Fitz RN, BA, [place] Dial:  [phone] Fax:  [phone]-mail: [email or web address] [address] [place]  [address]

## 2015-08-26 DIAGNOSIS — E662 Morbid (severe) obesity with alveolar hypoventilation: Secondary | ICD-10-CM | POA: Diagnosis not present

## 2015-08-26 DIAGNOSIS — G4733 Obstructive sleep apnea (adult) (pediatric): Secondary | ICD-10-CM | POA: Diagnosis not present

## 2015-08-28 ENCOUNTER — Other Ambulatory Visit: Payer: Self-pay

## 2015-08-28 ENCOUNTER — Telehealth: Payer: Self-pay | Admitting: Internal Medicine

## 2015-08-28 NOTE — Telephone Encounter (Signed)
Agree that he should be evaluated given his immunosuppression Will await the ER report

## 2015-08-28 NOTE — Patient Outreach (Signed)
[name]) Care Management  08/28/2015  [name] [birth year] [id number]  Sent a second request to [name] to update me on his diabetes medications- he indicated in an e-mail dated 08/22/15 that he is taking 20 units of Novolog at night. No other diabetes medications noted.    [name], RN, BA, [place], [place] Direct Dial:  [phone]  Fax:  [phone] E-mail: [name] [address], [place],   [address]

## 2015-08-28 NOTE — Telephone Encounter (Signed)
[facility]  Patient Name: [name]  DOB: 05/19/1960    Initial Comment Has a low grade fever, loose BM, no appetite joints hurt. Just got back from [place] a week ago    Nurse Assessment  Nurse: Genoveva Ill, RN, Lattie Haw Date/Time (Eastern Time): 08/28/2015 2:11:37 PM  Confirm and document reason for call. If symptomatic, describe symptoms. You must click the next button to save text entered. ---pt states has a low grade fever, chills, diarrhea x 3 today, no appetite, bilateral knee and elbow pain; just got back from [place] a week ago and was there for 2 weeks ; sx began a few days after returning  Has the patient traveled out of the country within the last 30 days? ---Not Applicable  Does the patient have any new or worsening symptoms? ---Yes  Will a triage be completed? ---Yes  Related visit to physician within the last 2 weeks? ---No  Does the PT have any chronic conditions? (i.e. diabetes, asthma, etc.) ---Yes  List chronic conditions. ---kidney transplant 1 yr ago, afib, heart disease, diabetes  Is this a behavioral health or substance abuse call? ---No     Guidelines    Guideline Title Affirmed Question Affirmed Notes  Knee Pain [1] Swollen joint AND [2] fever    Final Disposition User   Go to ED Now [place], RN, [facility] - ED   Disagree/Comply: Comply

## 2015-08-30 ENCOUNTER — Emergency Department: Payer: 59

## 2015-08-30 ENCOUNTER — Encounter: Payer: Self-pay | Admitting: *Deleted

## 2015-08-30 ENCOUNTER — Inpatient Hospital Stay
Admission: EM | Admit: 2015-08-30 | Discharge: 2015-09-04 | DRG: 682 | Disposition: A | Discharge status: Home / Self Care | Payer: 59 | Attending: Internal Medicine | Admitting: Internal Medicine

## 2015-08-30 DIAGNOSIS — M109 Gout, unspecified: Secondary | ICD-10-CM | POA: Diagnosis present

## 2015-08-30 DIAGNOSIS — Z951 Presence of aortocoronary bypass graft: Secondary | ICD-10-CM

## 2015-08-30 DIAGNOSIS — Z940 Kidney transplant status: Secondary | ICD-10-CM | POA: Diagnosis not present

## 2015-08-30 DIAGNOSIS — E876 Hypokalemia: Secondary | ICD-10-CM | POA: Diagnosis not present

## 2015-08-30 DIAGNOSIS — F329 Major depressive disorder, single episode, unspecified: Secondary | ICD-10-CM | POA: Diagnosis present

## 2015-08-30 DIAGNOSIS — R0602 Shortness of breath: Secondary | ICD-10-CM | POA: Diagnosis not present

## 2015-08-30 DIAGNOSIS — Z7901 Long term (current) use of anticoagulants: Secondary | ICD-10-CM | POA: Diagnosis not present

## 2015-08-30 DIAGNOSIS — R509 Fever, unspecified: Secondary | ICD-10-CM | POA: Diagnosis present

## 2015-08-30 DIAGNOSIS — Z8249 Family history of ischemic heart disease and other diseases of the circulatory system: Secondary | ICD-10-CM | POA: Diagnosis not present

## 2015-08-30 DIAGNOSIS — E1122 Type 2 diabetes mellitus with diabetic chronic kidney disease: Secondary | ICD-10-CM | POA: Diagnosis present

## 2015-08-30 DIAGNOSIS — I2510 Atherosclerotic heart disease of native coronary artery without angina pectoris: Secondary | ICD-10-CM | POA: Diagnosis present

## 2015-08-30 DIAGNOSIS — G4730 Sleep apnea, unspecified: Secondary | ICD-10-CM | POA: Diagnosis present

## 2015-08-30 DIAGNOSIS — E871 Hypo-osmolality and hyponatremia: Secondary | ICD-10-CM | POA: Diagnosis present

## 2015-08-30 DIAGNOSIS — R0902 Hypoxemia: Secondary | ICD-10-CM | POA: Diagnosis present

## 2015-08-30 DIAGNOSIS — N390 Urinary tract infection, site not specified: Secondary | ICD-10-CM | POA: Diagnosis not present

## 2015-08-30 DIAGNOSIS — Z79899 Other long term (current) drug therapy: Secondary | ICD-10-CM | POA: Diagnosis not present

## 2015-08-30 DIAGNOSIS — K219 Gastro-esophageal reflux disease without esophagitis: Secondary | ICD-10-CM | POA: Diagnosis present

## 2015-08-30 DIAGNOSIS — Z833 Family history of diabetes mellitus: Secondary | ICD-10-CM | POA: Diagnosis not present

## 2015-08-30 DIAGNOSIS — R9431 Abnormal electrocardiogram [ECG] [EKG]: Secondary | ICD-10-CM | POA: Diagnosis not present

## 2015-08-30 DIAGNOSIS — N184 Chronic kidney disease, stage 4 (severe): Secondary | ICD-10-CM | POA: Diagnosis present

## 2015-08-30 DIAGNOSIS — Z841 Family history of disorders of kidney and ureter: Secondary | ICD-10-CM

## 2015-08-30 DIAGNOSIS — Z87891 Personal history of nicotine dependence: Secondary | ICD-10-CM | POA: Diagnosis not present

## 2015-08-30 DIAGNOSIS — I132 Hypertensive heart and chronic kidney disease with heart failure and with stage 5 chronic kidney disease, or end stage renal disease: Secondary | ICD-10-CM | POA: Diagnosis present

## 2015-08-30 DIAGNOSIS — E669 Obesity, unspecified: Secondary | ICD-10-CM | POA: Diagnosis present

## 2015-08-30 DIAGNOSIS — I5023 Acute on chronic systolic (congestive) heart failure: Secondary | ICD-10-CM | POA: Diagnosis present

## 2015-08-30 DIAGNOSIS — I482 Chronic atrial fibrillation: Secondary | ICD-10-CM | POA: Diagnosis present

## 2015-08-30 DIAGNOSIS — N179 Acute kidney failure, unspecified: Principal | ICD-10-CM | POA: Diagnosis present

## 2015-08-30 DIAGNOSIS — D899 Disorder involving the immune mechanism, unspecified: Secondary | ICD-10-CM | POA: Diagnosis not present

## 2015-08-30 DIAGNOSIS — E860 Dehydration: Secondary | ICD-10-CM | POA: Diagnosis present

## 2015-08-30 DIAGNOSIS — Z6837 Body mass index (BMI) 37.0-37.9, adult: Secondary | ICD-10-CM

## 2015-08-30 DIAGNOSIS — Z794 Long term (current) use of insulin: Secondary | ICD-10-CM | POA: Diagnosis not present

## 2015-08-30 DIAGNOSIS — J45909 Unspecified asthma, uncomplicated: Secondary | ICD-10-CM | POA: Diagnosis present

## 2015-08-30 DIAGNOSIS — E119 Type 2 diabetes mellitus without complications: Secondary | ICD-10-CM | POA: Diagnosis not present

## 2015-08-30 DIAGNOSIS — N170 Acute kidney failure with tubular necrosis: Secondary | ICD-10-CM | POA: Diagnosis present

## 2015-08-30 LAB — BASIC METABOLIC PANEL
ANION GAP: 9 (ref 5–15)
BUN: 22 mg/dL — ABNORMAL HIGH (ref 6–20)
CALCIUM: 8.3 mg/dL — AB (ref 8.9–10.3)
CHLORIDE: 98 mmol/L — AB (ref 101–111)
CO2: 25 mmol/L (ref 22–32)
Creatinine, Ser: 1.42 mg/dL — ABNORMAL HIGH (ref 0.61–1.24)
GFR calc non Af Amer: 54 mL/min — ABNORMAL LOW (ref >60)
Glucose, Bld: 164 mg/dL — ABNORMAL HIGH (ref 65–99)
Potassium: 2.9 mmol/L — CL (ref 3.5–5.1)
SODIUM: 132 mmol/L — AB (ref 135–145)

## 2015-08-30 LAB — URINALYSIS COMPLETE WITH MICROSCOPIC (ARMC ONLY)
BILIRUBIN URINE: NEGATIVE
GLUCOSE, UA: NEGATIVE mg/dL
HGB URINE DIPSTICK: NEGATIVE
KETONES UR: NEGATIVE mg/dL
LEUKOCYTES UA: NEGATIVE
Nitrite: NEGATIVE
Protein, ur: >500 mg/dL — AB
SPECIFIC GRAVITY, URINE: 1.019 (ref 1.005–1.030)
Squamous Epithelial / LPF: NONE SEEN
pH: 5 (ref 5.0–8.0)

## 2015-08-30 LAB — CBC
HCT: 37.9 % — ABNORMAL LOW (ref 40.0–52.0)
HEMOGLOBIN: 13.1 g/dL (ref 13.0–18.0)
MCH: 27.8 pg (ref 26.0–34.0)
MCHC: 34.5 g/dL (ref 32.0–36.0)
MCV: 80.6 fL (ref 80.0–100.0)
Platelets: 93 10*3/uL — ABNORMAL LOW (ref 150–440)
RBC: 4.7 MIL/uL (ref 4.40–5.90)
RDW: 14.8 % — ABNORMAL HIGH (ref 11.5–14.5)
WBC: 4.4 10*3/uL (ref 3.8–10.6)

## 2015-08-30 LAB — LACTIC ACID, PLASMA
Lactic Acid, Venous: 1.1 mmol/L (ref 0.5–2.0)
Lactic Acid, Venous: 0.8 mmol/L (ref 0.5–2.0)

## 2015-08-30 LAB — GLUCOSE, CAPILLARY: GLUCOSE-CAPILLARY: 156 mg/dL — AB (ref 65–99)

## 2015-08-30 MED ORDER — TACROLIMUS 0.5 MG PO CAPS
0.5000 mg | ORAL_CAPSULE | Freq: Two times a day (BID) | ORAL | Status: DC
  Filled 2015-08-30: qty 1

## 2015-08-30 MED ORDER — APIXABAN 5 MG PO TABS
5.0000 mg | ORAL_TABLET | Freq: Two times a day (BID) | ORAL | Status: DC
  Administered 2015-08-30 – 2015-09-04 (×10): 5 mg via ORAL
  Filled 2015-08-30 (×10): qty 1

## 2015-08-30 MED ORDER — AMLODIPINE BESYLATE 5 MG PO TABS
5.0000 mg | ORAL_TABLET | Freq: Every day | ORAL | Status: DC
  Administered 2015-09-01 – 2015-09-04 (×4): 5 mg via ORAL
  Filled 2015-08-30 (×4): qty 1

## 2015-08-30 MED ORDER — CEFTRIAXONE SODIUM 1 G IJ SOLR: 1.0000 g | Freq: Once | INTRAMUSCULAR | Status: DC

## 2015-08-30 MED ORDER — ACETAMINOPHEN 650 MG RE SUPP: 650.0000 mg | Freq: Four times a day (QID) | RECTAL | Status: DC | PRN

## 2015-08-30 MED ORDER — ENOXAPARIN SODIUM 40 MG/0.4ML ~~LOC~~ SOLN: 40.0000 mg | SUBCUTANEOUS | Status: DC

## 2015-08-30 MED ORDER — POTASSIUM CHLORIDE IN NACL 40-0.9 MEQ/L-% IV SOLN
INTRAVENOUS | Status: DC
  Administered 2015-08-30 – 2015-09-01 (×2): 100 mL/h via INTRAVENOUS
  Filled 2015-08-30 (×5): qty 1000

## 2015-08-30 MED ORDER — MYCOPHENOLATE SODIUM 180 MG PO TBEC
720.0000 mg | DELAYED_RELEASE_TABLET | Freq: Two times a day (BID) | ORAL | Status: DC
  Administered 2015-08-30 – 2015-09-02 (×7): 720 mg via ORAL
  Filled 2015-08-30 (×8): qty 4

## 2015-08-30 MED ORDER — BUPROPION HCL 75 MG PO TABS
75.0000 mg | ORAL_TABLET | Freq: Two times a day (BID) | ORAL | Status: DC
  Administered 2015-08-30 – 2015-09-04 (×10): 75 mg via ORAL
  Filled 2015-08-30 (×11): qty 1

## 2015-08-30 MED ORDER — SODIUM CHLORIDE 0.9 % IV BOLUS (SEPSIS)
1000.0000 mL | Freq: Once | INTRAVENOUS | Status: AC
  Administered 2015-08-30: 1000 mL via INTRAVENOUS

## 2015-08-30 MED ORDER — INSULIN ASPART 100 UNIT/ML ~~LOC~~ SOLN
10.0000 [IU] | Freq: Three times a day (TID) | SUBCUTANEOUS | Status: DC
  Filled 2015-08-30: qty 10

## 2015-08-30 MED ORDER — HYDROCODONE-ACETAMINOPHEN 5-325 MG PO TABS
1.0000 | ORAL_TABLET | Freq: Once | ORAL | Status: AC
  Administered 2015-08-30: 1 via ORAL
  Filled 2015-08-30: qty 1

## 2015-08-30 MED ORDER — HYDROCODONE-ACETAMINOPHEN 5-325 MG PO TABS
1.0000 | ORAL_TABLET | ORAL | Status: DC | PRN
  Administered 2015-08-30 – 2015-08-31 (×2): 2 via ORAL
  Filled 2015-08-30 (×2): qty 2

## 2015-08-30 MED ORDER — DEXTROSE 5 % IV SOLN
1.0000 g | Freq: Once | INTRAVENOUS | Status: AC
  Administered 2015-08-30: 1 g via INTRAVENOUS
  Filled 2015-08-30: qty 10

## 2015-08-30 MED ORDER — ONDANSETRON HCL 4 MG PO TABS: 4.0000 mg | ORAL_TABLET | Freq: Four times a day (QID) | ORAL | Status: DC | PRN

## 2015-08-30 MED ORDER — LISINOPRIL 20 MG PO TABS
40.0000 mg | ORAL_TABLET | Freq: Every day | ORAL | Status: DC
  Administered 2015-09-01 – 2015-09-04 (×3): 40 mg via ORAL
  Filled 2015-08-30 (×4): qty 2

## 2015-08-30 MED ORDER — ADULT MULTIVITAMIN W/MINERALS CH
1.0000 | ORAL_TABLET | Freq: Every day | ORAL | Status: DC
  Administered 2015-08-31 – 2015-09-03 (×4): 1 via ORAL
  Filled 2015-08-30 (×4): qty 1

## 2015-08-30 MED ORDER — DEXTROSE 5 % IV SOLN
1.0000 g | INTRAVENOUS | Status: DC
  Filled 2015-08-30: qty 10

## 2015-08-30 MED ORDER — PANTOPRAZOLE SODIUM 40 MG PO TBEC
40.0000 mg | DELAYED_RELEASE_TABLET | Freq: Every day | ORAL | Status: DC
  Administered 2015-08-31 – 2015-09-04 (×5): 40 mg via ORAL
  Filled 2015-08-30 (×5): qty 1

## 2015-08-30 MED ORDER — TACROLIMUS 1 MG PO CAPS
1.0000 mg | ORAL_CAPSULE | Freq: Every day | ORAL | Status: DC
  Filled 2015-08-30: qty 1

## 2015-08-30 MED ORDER — INSULIN GLARGINE 100 UNIT/ML ~~LOC~~ SOLN
20.0000 [IU] | Freq: Every day | SUBCUTANEOUS | Status: DC
  Administered 2015-08-30 – 2015-09-03 (×5): 20 [IU] via SUBCUTANEOUS
  Filled 2015-08-30 (×6): qty 0.2

## 2015-08-30 MED ORDER — ONDANSETRON HCL 4 MG/2ML IJ SOLN: 4.0000 mg | Freq: Four times a day (QID) | INTRAMUSCULAR | Status: DC | PRN

## 2015-08-30 MED ORDER — FLUTICASONE PROPIONATE 50 MCG/ACT NA SUSP
1.0000 | Freq: Every day | NASAL | Status: DC
  Administered 2015-08-31 – 2015-09-04 (×5): 1 via NASAL
  Filled 2015-08-30: qty 16

## 2015-08-30 MED ORDER — ACETAMINOPHEN 325 MG PO TABS
650.0000 mg | ORAL_TABLET | Freq: Four times a day (QID) | ORAL | Status: DC | PRN
Start: 2015-08-30 — End: 2015-09-04
  Administered 2015-08-31 – 2015-09-02 (×4): 650 mg via ORAL
  Filled 2015-08-30 (×4): qty 2

## 2015-08-30 MED ORDER — MORPHINE SULFATE (PF) 2 MG/ML IV SOLN
1.0000 mg | INTRAVENOUS | Status: DC | PRN
Start: 2015-08-30 — End: 2015-09-04

## 2015-08-30 MED ORDER — POTASSIUM CHLORIDE CRYS ER 20 MEQ PO TBCR
40.0000 meq | EXTENDED_RELEASE_TABLET | Freq: Once | ORAL | Status: AC
  Administered 2015-08-30: 40 meq via ORAL
  Filled 2015-08-30: qty 2

## 2015-08-30 MED ORDER — HYDROCODONE-ACETAMINOPHEN 5-325 MG PO TABS: 1.0000 | ORAL_TABLET | Freq: Four times a day (QID) | ORAL | Status: DC | PRN

## 2015-08-30 MED ORDER — TACROLIMUS 1 MG PO CAPS
1.0000 mg | ORAL_CAPSULE | Freq: Two times a day (BID) | ORAL | Status: DC
  Administered 2015-08-30 – 2015-09-04 (×10): 1 mg via ORAL
  Filled 2015-08-30 (×12): qty 1

## 2015-08-30 NOTE — H&P (Signed)
[place] [facility] [place] NAME: [name]    MR#:  [id number]  DATE OF BIRTH:  [birth year]  DATE OF ADMISSION:  08/30/2015  PRIMARY CARE PHYSICIAN: [name], MD   REQUESTING/REFERRING PHYSICIAN: Dr [name]  CHIEF COMPLAINT:  Generalized body aches, chills, poor by mouth intake, nausea for one day  HISTORY OF PRESENT ILLNESS:  [name]  is a 55 y.o. male with a known history ofEnd-stage renal disease who was on dialysis underwent renal transplant 5 years ago follows at [facility] transplant team, hypertension, diabetes comes to the emergency room with generalized body aches chills poor by mouth intake and nausea after he returned from a trip to [place] but it week to 10 days ago and thereafter went to [place] and just got back yesterday. Patient does not remember having any sick contacts. Denies any diarrhea chest pain, abdominal pain. He does have history of distinct border on his urine has a slight darker tinged. Patient was found to be dehydrated and are in acute renal failure with creatinine of 1.42 baseline creatinine 0.9 along with hyponatremia and low potassium. He was started on IV Rocephin after Dr. Reita [name] in the emergency room spoke with his transplant team.  PAST MEDICAL HISTORY:   Past Medical History  Diagnosis Date  . Hypertension   . Hyperlipidemia     low since renal failure  . Kidney transplant status, cadaveric 2012  . Depression   . GERD (gastroesophageal reflux disease)   . Diabetes mellitus   . Gout   . Asthma   . ESRD (end stage renal disease) ([place]) 2005    Dialysis then renal transplant  . CAD (coronary artery disease) 2005    had CABG  . CHF (congestive heart failure) ([place])   . Pneumonia   . Wears glasses   . Sleep apnea     bipap with oxygen 2 l  . Atrial fibrillation ([place])   . Trigger finger   . Amnestic MCI (mild cognitive impairment with memory loss)   . Dyspnea   . Benign neoplasm of colon   .  Diverticulosis   . Chronic venous stasis dermatitis of both lower extremities   . Purpura ([place])   . Erectile dysfunction   . Obesity   . Atrial fibrillation ([place]) 2016    PAST SURGICAL HISTOIRY:   Past Surgical History  Procedure Laterality Date  . Kidney transplant  09/13/2010    cadaver--at [facility]  . Tympanic membrane repair  1/12    left  . Dg angio av shunt*l*      right and left upper arms  . Fasciotomy  03/03/2012    Procedure: FASCIOTOMY;  Surgeon: [name], MD;  Location: [facility];  Service: Orthopedics;  Laterality: Right;  FASCIOTOMY RIGHT SMALL FINGER  . Cardiac catheterization      [facility]  . Coronary artery bypass graft  2005    X 4  . Colonoscopy with propofol N/A 04/22/2013    Procedure: COLONOSCOPY WITH PROPOFOL;  Surgeon: [name], MD;  Location: [facility];  Service: Endoscopy;  Laterality: N/A;  . Fasciotomy Left 08/17/2013    Procedure: FASCIOTOMY LEFT RING;  Surgeon: [name], MD;  Location: [place];  Service: Orthopedics;  Laterality: Left;  . Bariatric surgery    . Vasectomy      SOCIAL HISTORY:   Social History  Substance Use Topics  . Smoking status: Former Smoker  Types: Cigars    Quit date: 09/02/1994  . Smokeless tobacco: Never Used     Comment: cigar- once in a while  . Alcohol Use: 0.0 - 0.6 oz/week    0-1 Standard drinks or equivalent per week     Comment: occasional- 3 in the past year    FAMILY HISTORY:   Family History  Problem Relation Age of Onset  . Heart disease Father   . Kidney failure Father   . Kidney disease Father   . Diabetes Maternal Grandmother   . Breast cancer Maternal Grandmother   . Liver cancer Paternal Uncle   . Liver cancer Paternal Grandmother   . Prostate cancer Neg Hx     DRUG ALLERGIES:   Allergies  Allergen Reactions  . Morphine Itching    REVIEW OF SYSTEMS:  Review of Systems  Constitutional: Positive for chills. Negative for fever and  weight loss.  HENT: Negative for ear discharge, ear pain and nosebleeds.   Eyes: Negative for blurred vision, pain and discharge.  Respiratory: Negative for sputum production, shortness of breath, wheezing and stridor.   Cardiovascular: Negative for chest pain, palpitations, orthopnea and PND.  Gastrointestinal: Negative for nausea, vomiting, abdominal pain and diarrhea.  Genitourinary: Negative for urgency and frequency.  Musculoskeletal: Positive for joint pain. Negative for back pain.  Neurological: Positive for weakness. Negative for sensory change, speech change and focal weakness.  Psychiatric/Behavioral: Negative for depression and hallucinations. The patient is not nervous/anxious.   All other systems reviewed and are negative.    MEDICATIONS AT HOME:   Prior to Admission medications   Medication Sig Start Date End Date Taking? Authorizing Provider  amLODipine (NORVASC) 5 MG tablet Take 1 tablet (5 mg total) by mouth daily. 11/15/14  Yes [name], MD  apixaban (ELIQUIS) 5 MG TABS tablet Take 5 mg by mouth 2 (two) times daily.   Yes Historical Provider, MD  buPROPion (WELLBUTRIN) 75 MG tablet Take 1 tablet (75 mg total) by mouth 2 (two) times daily. 11/02/14  Yes [name], MD  HYDROcodone-acetaminophen (NORCO/VICODIN) 5-325 MG tablet Take 1-2 tablets by mouth every 6 (six) hours as needed for moderate pain.   Yes Historical Provider, MD  insulin aspart (NOVOLOG FLEXPEN) 100 UNIT/ML FlexPen Inject 10 Units into the skin 3 (three) times daily before meals.   Yes Historical Provider, MD  insulin glargine (LANTUS) 100 unit/mL SOPN Inject 20 Units into the skin at bedtime.   Yes Historical Provider, MD  lisinopril (PRINIVIL,ZESTRIL) 40 MG tablet Take 40 mg by mouth daily.   Yes Historical Provider, MD  amoxicillin-clavulanate (AUGMENTIN) 875-125 MG tablet Take 1 tablet by mouth 2 (two) times daily. Patient not taking: Reported on 08/30/2015 07/27/15   Carl Fenton, NP   furosemide (LASIX) 40 MG tablet Take 40 mg by mouth as needed for edema. Taking q 2 days    Historical Provider, MD  levofloxacin (LEVAQUIN) 500 MG tablet Take 1 tablet (500 mg total) by mouth daily. 07/27/15   Carl Fenton, NP  metolazone (ZAROXOLYN) 2.5 MG tablet Take 2.5 mg by mouth as needed (for excess fluid). Reported on 03/22/2015    Historical Provider, MD  mometasone (NASONEX) 50 MCG/ACT nasal spray Place 2 sprays into the nose at bedtime. Reported on 03/22/2015 05/22/15   [name], MD  Multiple Vitamin (MULTIVITAMIN WITH MINERALS) TABS tablet Take 1 tablet by mouth daily.    Historical Provider, MD  mycophenolate (MYFORTIC) 180 MG EC tablet Take 720  mg by mouth 2 (two) times daily.     Historical Provider, MD  omeprazole (PRILOSEC) 20 MG capsule TAKE 1 CAPSULE BY MOUTH DAILY. 10/28/14   [name], MD  potassium chloride (MICRO-K) 10 MEQ CR capsule Take 30 mEq by mouth. Reported on 03/10/2015 02/24/15   Historical Provider, MD  potassium chloride SA (K-DUR,KLOR-CON) 20 MEQ tablet Take 40 mEq by mouth as needed (when taking Furosemide or Metolazone.). Reported on 07/18/2015    Historical Provider, MD  tacrolimus (PROGRAF) 0.5 MG capsule Take 0.5 mg by mouth 2 (two) times daily. Reported on 03/10/2015    Historical Provider, MD  tacrolimus (PROGRAF) 1 MG capsule Take 1 mg by mouth daily. Reported on 07/18/2015    Historical Provider, MD      VITAL SIGNS:  Blood pressure 126/67, pulse 93, temperature 98.4 F (36.9 C), temperature source Oral, resp. rate 20, height 5' 11" (1.803 m), weight 117.028 kg (258 lb), SpO2 94 %.  PHYSICAL EXAMINATION:  GENERAL:  55 y.o.-year-old patient lying in the bed with no acute distress.  EYES: Pupils equal, round, reactive to light and accommodation. No scleral icterus. Extraocular muscles intact.  HEENT: Head atraumatic, normocephalic. Oropharynx and nasopharynx clear.  NECK:  Supple, no jugular venous distention. No thyroid enlargement, no  tenderness.  LUNGS: Normal breath sounds bilaterally, no wheezing, rales,rhonchi or crepitation. No use of accessory muscles of respiration.  CARDIOVASCULAR: S1, S2 normal. No murmurs, rubs, or gallops.  ABDOMEN: Soft, nontender, nondistended. Bowel sounds present. No organomegaly or mass.  EXTREMITIES: No pedal edema, cyanosis, or clubbing.  NEUROLOGIC: Cranial nerves II through XII are intact. Muscle strength 5/5 in all extremities. Sensation intact. Gait not checked.  PSYCHIATRIC: The patient is alert and oriented x 3.  SKIN: No obvious rash, lesion, or ulcer.   LABORATORY PANEL:   CBC  Recent Labs Lab 08/30/15 1531  WBC 4.4  HGB 13.1  HCT 37.9*  PLT 93*   ------------------------------------------------------------------------------------------------------------------  Chemistries   Recent Labs Lab 08/30/15 1531  NA 132*  K 2.9*  CL 98*  CO2 25  GLUCOSE 164*  BUN 22*  CREATININE 1.42*  CALCIUM 8.3*   ------------------------------------------------------------------------------------------------------------------  Cardiac Enzymes No results for input(s): TROPONINI in the last 168 hours. ------------------------------------------------------------------------------------------------------------------  RADIOLOGY:  Dg Chest 2 View  08/30/2015  CLINICAL DATA:  55 year old male with generalized weakness, dizziness, joint pain, fever and chills for the past week EXAM: CHEST  2 VIEW COMPARISON:  Prior chest x-ray 10/13/2014 FINDINGS: Stable cardiomegaly. Status post median sternotomy with evidence of prior multivessel CABG. Bulky calcified right mediastinal lymphadenopathy. Mild vascular congestion without overt edema appears stable air compared to prior. No focal airspace consolidation to suggest pneumonia. No pleural effusion or pneumothorax. Osseous structures are intact. Mild degenerative endplate changes. IMPRESSION: Stable chest x-ray without acute cardiopulmonary  process. Electronically Signed   By: Jacqulynn Cadet M.D.   On: 08/30/2015 16:43    EKG:    IMPRESSION AND PLAN:   Daaron Mcintyre  is a 55 y.o. male with a known history ofEnd-stage renal disease who was on dialysis underwent renal transplant 5 years ago follows at [facility] transplant team, hypertension, diabetes comes to the emergency room with generalized body aches chills poor by mouth intake and nausea after he returned from a trip to [place] but it week to 10 days ago and thereafter went to [place] and just got back yesterday.  1. Acute renal failure/dehydration appears prerenal azotemia -Baseline creatinine 0.9 -Patient presented with creatinine of 1.42  with hyponatremia and hypokalemia -Aggressive IV fluids -Hold off on diuretics -Monitor I's and O's 1 nephrotoxins -Consider nephrology consultation if needed  2. Fever chills body aches suspect viral syndrome with recent travel -No rash or tick bite per patient -IV Rocephin empiric for UTI -Transplant team at [facility] wants to send PCR DNA for EBV, CMV, BK virus. I have ordered these tests as lab miscellaneous -Urine culture  3. History of renal transplant -Continue tacrolimus and mycophenolate  4. Type 2 diabetes sliding scale insulin and home dose of insulin  5. History of chronic atrial fibrillation on oral anticoagulation  6. DVT prophylaxis patient already on home dose anticoagulation  Above was discussed with patient and patient's wife present in the emergency room  Dr Rica Mote [phone] Transplant team [phone]  All the records are reviewed and case discussed with ED provider. Management plans discussed with the patient, family and they are in agreement.  CODE STATUS:  Full  TOTAL TIME TAKING CARE OF THIS PATIENT:  50 minutes.    Kyliegh Jester M.D on 08/30/2015 at 6:49 PM  Between 7am to 6pm - Pager - [phone]  After 6pm go to [email or web address] - password [email or web address] [facility]  Office   [phone]  CC: Primary care physician; [name], MD

## 2015-08-30 NOTE — ED Provider Notes (Addendum)
[facility] Emergency Department Provider Note   ____________________________________________  Time seen: Approximately 4 PM I have reviewed the triage vital signs and the triage nursing note.  HISTORY  Chief Complaint Fever and Joint Pain   Historian Patient  HPI [name] is a 55 y.o. male with a history of kidney transplant 5 years ago, on prografand eliquis, here for body aches, subjective fever and chills for 2 days, achy joints all over, mild headache and mild cough.  They did not take his temperature. He's not had any skin rash. Denies vomiting or diarrhea. He was recently in [place] for 14 days in [place] and sprain, and returned 3 weeks ago.  No abdominal pain. No chest pain. Mild nonproductive cough. He has used inhalers in the past, but has not been wheezing.  Symptoms are mild to moderate. Nothing seems to make it worse or better.    Past Medical History  Diagnosis Date  . Hypertension   . Hyperlipidemia     low since renal failure  . Kidney transplant status, cadaveric 2012  . Depression   . GERD (gastroesophageal reflux disease)   . Diabetes mellitus   . Gout   . Asthma   . ESRD (end stage renal disease) ([place]) 2005    Dialysis then renal transplant  . CAD (coronary artery disease) 2005    had CABG  . CHF (congestive heart failure) ([place])   . Pneumonia   . Wears glasses   . Sleep apnea     bipap with oxygen 2 l  . Atrial fibrillation ([place])   . Trigger finger   . Amnestic MCI (mild cognitive impairment with memory loss)   . Dyspnea   . Benign neoplasm of colon   . Diverticulosis   . Chronic venous stasis dermatitis of both lower extremities   . Purpura ([place])   . Erectile dysfunction   . Obesity   . Atrial fibrillation ([place]) 2016    Patient Active Problem List   Diagnosis Date Noted  . BPH with obstruction/lower urinary tract symptoms 09/25/2014  . Hypogonadism in male 09/25/2014  . Immunosuppressed status ([place])  09/09/2014  . Atrial fibrillation ([place]) 07/18/2014  . Bariatric surgery status 04/25/2014  . Chronic systolic heart failure ([place]) 12/13/2013  . Pre-operative respiratory examination 11/23/2013  . MDD (recurrent major depressive disorder) in remission ([place]) 10/04/2013  . Benign neoplasm of colon 04/22/2013  . Diverticulosis of colon (without mention of hemorrhage) 04/22/2013  . History of renal transplant 03/01/2013  . Asthma, persistent controlled 02/25/2013  . Erectile dysfunction 10/20/2012  . Obstructive sleep apnea 09/29/2012  . Gout 09/13/2012  . Obesity hypoventilation syndrome ([place]) 03/31/2012  . Routine general medical examination at a health care facility 11/12/2011  . Trigger finger 09/25/2011  . Type 2 diabetes, controlled, with renal manifestation ([place]) 03/01/2011  . Essential (primary) hypertension 12/17/2010  . H/O kidney transplant 12/17/2010  . BMI 40.0-44.9, adult ([place]) 12/17/2010  . CAD, multiple vessel 12/17/2010  . CAD (coronary artery disease)   . Hypertension   . Hyperlipidemia   . ESRD (end stage renal disease) [facility])     Past Surgical History  Procedure Laterality Date  . Kidney transplant  09/13/2010    cadaver--at [facility]  . Tympanic membrane repair  1/12    left  . Dg angio av shunt*l*      right and left upper arms  . Fasciotomy  03/03/2012    Procedure: FASCIOTOMY;  Surgeon: [name], MD;  Location: [facility];  Service: Orthopedics;  Laterality: Right;  FASCIOTOMY RIGHT SMALL FINGER  . Cardiac catheterization      [facility]  . Coronary artery bypass graft  2005    X 4  . Colonoscopy with propofol N/A 04/22/2013    Procedure: COLONOSCOPY WITH PROPOFOL;  Surgeon: [name], MD;  Location: [facility];  Service: Endoscopy;  Laterality: N/A;  . Fasciotomy Left 08/17/2013    Procedure: FASCIOTOMY LEFT RING;  Surgeon: [name], MD;  Location: [facility];  Service: Orthopedics;  Laterality: Left;  . Bariatric  surgery    . Vasectomy      Current Outpatient Rx  Name  Route  Sig  Dispense  Refill  . amLODipine (NORVASC) 5 MG tablet   Oral   Take 1 tablet (5 mg total) by mouth daily.   90 tablet   3   . amoxicillin-clavulanate (AUGMENTIN) 875-125 MG tablet   Oral   Take 1 tablet by mouth 2 (two) times daily.   20 tablet   0   . buPROPion (WELLBUTRIN) 75 MG tablet   Oral   Take 1 tablet (75 mg total) by mouth 2 (two) times daily.   180 tablet   3   . ELIQUIS 5 MG TABS tablet      TAKE 1 TABLET BY MOUTH 2 TIMES DAILY.   60 tablet   3   . furosemide (LASIX) 40 MG tablet   Oral   Take 40 mg by mouth as needed for edema. Taking q 2 days         . glucose blood (BAYER CONTOUR NEXT TEST) test strip      Use as instructed three times daily         . insulin aspart (NOVOLOG) 100 UNIT/ML injection   Subcutaneous   Inject 2-3 Units into the skin 3 (three) times daily with meals. Reported on 03/22/2015         . insulin glargine (LANTUS) 100 UNIT/ML injection   Subcutaneous   Inject 10 Units into the skin at bedtime.          Marland Kitchen levofloxacin (LEVAQUIN) 500 MG tablet   Oral   Take 1 tablet (500 mg total) by mouth daily.   10 tablet   0   . metolazone (ZAROXOLYN) 2.5 MG tablet   Oral   Take 2.5 mg by mouth as needed (for excess fluid). Reported on 03/22/2015         . mometasone (NASONEX) 50 MCG/ACT nasal spray   Nasal   Place 2 sprays into the nose at bedtime. Reported on 03/22/2015   17 g   11   . Multiple Vitamin (MULTIVITAMIN WITH MINERALS) TABS tablet   Oral   Take 1 tablet by mouth daily.         . mycophenolate (MYFORTIC) 180 MG EC tablet   Oral   Take 720 mg by mouth 2 (two) times daily.          Marland Kitchen omeprazole (PRILOSEC) 20 MG capsule      TAKE 1 CAPSULE BY MOUTH DAILY.   90 capsule   3   . potassium chloride (MICRO-K) 10 MEQ CR capsule   Oral   Take 30 mEq by mouth. Reported on 03/10/2015         . potassium chloride SA (K-DUR,KLOR-CON)  20 MEQ tablet   Oral   Take 40 mEq by mouth as needed (when taking Furosemide or  Metolazone.). Reported on 07/18/2015         . tacrolimus (PROGRAF) 0.5 MG capsule   Oral   Take 0.5 mg by mouth 2 (two) times daily. Reported on 03/10/2015         . tacrolimus (PROGRAF) 1 MG capsule   Oral   Take 1 mg by mouth daily. Reported on 07/18/2015           Allergies Morphine  Family History  Problem Relation Age of Onset  . Heart disease Father   . Kidney failure Father   . Kidney disease Father   . Diabetes Maternal Grandmother   . Breast cancer Maternal Grandmother   . Liver cancer Paternal Uncle   . Liver cancer Paternal Grandmother   . Prostate cancer Neg Hx     Social History Social History  Substance Use Topics  . Smoking status: Former Smoker    Types: Cigars    Quit date: 09/02/1994  . Smokeless tobacco: Never Used     Comment: cigar- once in a while  . Alcohol Use: 0.0 - 0.6 oz/week    0-1 Standard drinks or equivalent per week     Comment: occasional- 3 in the past year    Review of Systems  Constitutional: Negative for Documented fever, but subjective fevers and chills. Eyes: Negative for visual changes. ENT: Negative for sore throat. Cardiovascular: Negative for chest pain. Respiratory: Negative for shortness of breath. Positive for mild nonproductive cough. Gastrointestinal: Negative for abdominal pain, vomiting and diarrhea. Genitourinary: Negative for dysuria. Dark urine Musculoskeletal: Negative for back pain. Skin: Negative for rash. Neurological: Positive for mild global headache. 10 point Review of Systems otherwise negative ____________________________________________   PHYSICAL EXAM:  VITAL SIGNS: ED Triage Vitals  Enc Vitals Group     BP 08/30/15 1530 126/67 mmHg     Pulse Rate 08/30/15 1530 93     Resp 08/30/15 1530 20     Temp 08/30/15 1530 98.4 F (36.9 C)     Temp Source 08/30/15 1530 Oral     SpO2 08/30/15 1530 94 %      Weight 08/30/15 1530 258 lb (117.028 kg)     Height 08/30/15 1530 _0  (1.803 m)     Head Cir --      Peak Flow --      Pain Score --      Pain Loc --      Pain Edu? --      Excl. in [place]? --      Constitutional: Alert and oriented. Well appearing and in no distress. HEENT   Head: Normocephalic and atraumatic.      Eyes: Conjunctivae are normal. PERRL. Normal extraocular movements.      Ears:         Nose: No congestion/rhinnorhea.   Mouth/Throat: Mucous membranes are moist.   Neck: No stridor. Cardiovascular/Chest: Normal rate, regular rhythm.  No murmurs, rubs, or gallops. Respiratory: Normal respiratory effort without tachypnea nor retractions. Breath sounds are clear and equal bilaterally. No wheezes/rales/rhonchi.Occasional cough that is dry. Gastrointestinal: Soft. No distention, no guarding, no rebound. Nontender.    Genitourinary/rectal:Deferred Musculoskeletal: Nontender with normal range of motion in all extremities. No joint effusions.  No lower extremity tenderness.  No edema. Neurologic:  Normal speech and language. No gross or focal neurologic deficits are appreciated. Skin:  Skin is warm, dry and intact. No rash noted. Psychiatric: Mood and affect are normal. Speech and behavior are normal. Patient exhibits appropriate insight and  judgment.  ____________________________________________   EKG I, Lisa Roca, MD, the attending physician have personally viewed and interpreted all ECGs.  87 bpm. Irregularly irregular consistent with atrial fibrillation. Left axis deviation. Nonspecific intraventricular conduction delay. ____________________________________________  LABS (pertinent positives/negatives)  Labs Reviewed  BASIC METABOLIC PANEL - Abnormal; Notable for the following:    Sodium 132 (*)    Potassium 2.9 (*)    Chloride 98 (*)    Glucose, Bld 164 (*)    BUN 22 (*)    Creatinine, Ser 1.42 (*)    Calcium 8.3 (*)    GFR calc non Af Amer 54 (*)     All other components within normal limits  CBC - Abnormal; Notable for the following:    HCT 37.9 (*)    RDW 14.8 (*)    Platelets 93 (*)    All other components within normal limits  URINALYSIS COMPLETEWITH MICROSCOPIC ([facility] ONLY) - Abnormal; Notable for the following:    Color, Urine YELLOW (*)    APPearance CLEAR (*)    Protein, ur >500 (*)    Bacteria, UA RARE (*)    All other components within normal limits  CULTURE, BLOOD (ROUTINE X 2)  CULTURE, BLOOD (ROUTINE X 2)  URINE CULTURE  CBG MONITORING, ED    ____________________________________________  RADIOLOGY All Xrays were viewed by me. Imaging interpreted by Radiologist.  Chest two-view: Stable chest x-ray without acute cardio pulmonary process. __________________________________________  PROCEDURES  Procedure(s) performed: None  Critical Care performed: None  ____________________________________________   ED COURSE / ASSESSMENT AND PLAN  Pertinent labs & imaging results that were available during my care of the patient were reviewed by me and considered in my medical decision making (see chart for details).   Stable vital signs, but patient with history of kidney transplant on Prograf and CellCept with symptoms concerning for possible systemic infection of subjective fever and chills and body aches.  Chest x-ray clear for pneumonia. White blood cell count 4.4, no neutropenia.  Urinalysis showing no squamous epithelial cells, but rare bacteria with 0-5 white blood cells, unclear if this may be contaminant versus true urinary tract infection. Given the patient's history, I'm inclined to go ahead and treat and car for urinary tract infection with Rocephin.  Metabolic panel significant for acute renal failure. Patient also found to have multiple electrolyte abnormalities and potassium was repleted.   Will be admitted here after speaking with nephrologist at [place]:  On-call  nephrologist, [facility] [name] - recommends treatment for likely uti and dehydration.  Send blood pcr CMV, EBV, and BK virus.  No need for transfer, ok for admission and treatment here. Recommends 1/2 dose of Myfortic during antibiotic treatment (approx 1 week?)   Patient / Family / Caregiver informed of clinical course, medical decision-making process, and agree with plan.   ___________________________________________   FINAL CLINICAL IMPRESSION(S) / ED DIAGNOSES   Final diagnoses:  UTI (lower urinary tract infection)  Hypokalemia  Hyponatremia  Acute renal failure, unspecified acute renal failure type [facility])              Note: This dictation was prepared with Dragon dictation. Any transcriptional errors that result from this process are unintentional   Lisa Roca, MD 08/30/15 1809  Lisa Roca, MD 08/30/15 [phone]

## 2015-08-30 NOTE — ED Notes (Signed)
Potassium 2.9, Dr.lord notified

## 2015-08-30 NOTE — ED Notes (Signed)
Pt complains of generalized weakness, joint pain, fever and chills for the last week, pt returned from a trip to [place] 3 weeks ago

## 2015-08-31 LAB — GASTROINTESTINAL PANEL BY PCR, STOOL (REPLACES STOOL CULTURE)

## 2015-08-31 LAB — URINE CULTURE: CULTURE: NO GROWTH

## 2015-08-31 LAB — BASIC METABOLIC PANEL
ANION GAP: 8 (ref 5–15)
BUN: 20 mg/dL (ref 6–20)
CALCIUM: 8.4 mg/dL — AB (ref 8.9–10.3)
CO2: 26 mmol/L (ref 22–32)
Chloride: 103 mmol/L (ref 101–111)
Creatinine, Ser: 1.26 mg/dL — ABNORMAL HIGH (ref 0.61–1.24)
GFR calc Af Amer: >60 mL/min (ref >60)
GFR calc non Af Amer: >60 mL/min (ref >60)
GLUCOSE: 126 mg/dL — AB (ref 65–99)
POTASSIUM: 3.2 mmol/L — AB (ref 3.5–5.1)
Sodium: 137 mmol/L (ref 135–145)

## 2015-08-31 LAB — HEPATIC FUNCTION PANEL
ALK PHOS: 128 U/L — AB (ref 38–126)
ALT: 27 U/L (ref 17–63)
AST: 46 U/L — AB (ref 15–41)
Albumin: 3.3 g/dL — ABNORMAL LOW (ref 3.5–5.0)
BILIRUBIN DIRECT: 0.6 mg/dL — AB (ref 0.1–0.5)
BILIRUBIN TOTAL: 1.5 mg/dL — AB (ref 0.3–1.2)
Indirect Bilirubin: 0.9 mg/dL (ref 0.3–0.9)
Total Protein: 6.1 g/dL — ABNORMAL LOW (ref 6.5–8.1)

## 2015-08-31 LAB — C DIFFICILE QUICK SCREEN W PCR REFLEX
C DIFFICILE (CDIFF) TOXIN: NEGATIVE
C Diff antigen: NEGATIVE
C Diff interpretation: NEGATIVE

## 2015-08-31 LAB — GLUCOSE, CAPILLARY
Glucose-Capillary: 131 mg/dL — ABNORMAL HIGH (ref 65–99)
Glucose-Capillary: 159 mg/dL — ABNORMAL HIGH (ref 65–99)
Glucose-Capillary: 165 mg/dL — ABNORMAL HIGH (ref 65–99)
Glucose-Capillary: 193 mg/dL — ABNORMAL HIGH (ref 65–99)

## 2015-08-31 MED ORDER — INSULIN ASPART 100 UNIT/ML ~~LOC~~ SOLN
0.0000 [IU] | Freq: Three times a day (TID) | SUBCUTANEOUS | Status: DC
  Administered 2015-08-31 – 2015-09-01 (×3): 2 [IU] via SUBCUTANEOUS
  Administered 2015-09-02: 1 [IU] via SUBCUTANEOUS
  Administered 2015-09-02: 3 [IU] via SUBCUTANEOUS
  Administered 2015-09-02 – 2015-09-04 (×4): 1 [IU] via SUBCUTANEOUS
  Filled 2015-08-31: qty 3
  Filled 2015-08-31 (×2): qty 2
  Filled 2015-08-31 (×5): qty 1
  Filled 2015-08-31: qty 2

## 2015-08-31 MED ORDER — DOXYCYCLINE HYCLATE 100 MG PO TABS
100.0000 mg | ORAL_TABLET | Freq: Two times a day (BID) | ORAL | Status: DC
  Administered 2015-08-31 – 2015-09-04 (×9): 100 mg via ORAL
  Filled 2015-08-31 (×9): qty 1

## 2015-08-31 MED ORDER — INSULIN ASPART 100 UNIT/ML ~~LOC~~ SOLN: 0.0000 [IU] | Freq: Every day | SUBCUTANEOUS | Status: DC

## 2015-08-31 MED ORDER — SODIUM CHLORIDE 0.9 % IV SOLN
3.0000 g | Freq: Four times a day (QID) | INTRAVENOUS | Status: DC
  Administered 2015-08-31 – 2015-09-04 (×16): 3 g via INTRAVENOUS
  Filled 2015-08-31 (×22): qty 3

## 2015-08-31 NOTE — Consult Note (Signed)
[name]     Reason for Consult:fever, immunosuppressed    Referring Physician: Claria Dice Date of Admission:  08/30/2015   Active Problems:   Acute renal failure (ARF) ([place])   HPI: [name] is a 55 y.o. male admitted 6/7 with 2-3 days of fevers, chills and NS in background of feeling "poorly" with fatigue, arthralgias for 2-3 weeks. He has a hx of renal transplant 4 years ago and is on immunosuppressives.  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  There he was mainly urban but did visit vineyards, and a goat cheese farm and did consume unpasteurized goat cheese.   He also has some mild headache, mild sore throat and post nasal drip symptoms and mild non productive cough. He has had loose stools 4-5 times a day since return from [place].   He had some swelling in L leg but had doppler done 5/26 negative.  No recent dental work, no recent dental infections, skin infections.   He has a history of HTN and DM obesity (s/p gastric sleeve 02/2014) and end-stage renal [name] who received a DDRT on 09/13/10, both he and donor were CMV negative. He is listed as being on Bactrim 3x a week in his Renal note from this year.  He is on tacrolimus and mycophenolic acid with no change in his meds recently. [facility] at [facility].   Past Medical History  Diagnosis Date  . Hypertension   . Hyperlipidemia     low since renal failure  . Kidney transplant status, cadaveric 2012  . Depression   . GERD (gastroesophageal reflux [name])   . Diabetes mellitus   . Gout   . Asthma   . ESRD (end stage renal [name]) ([place]) 2005    Dialysis then renal transplant  . CAD (coronary artery [name]) 2005    had CABG  . CHF (congestive heart failure) ([place])   . Pneumonia   . Wears glasses   . Sleep apnea     bipap with oxygen 2 l  . Atrial fibrillation ([place])   . Trigger finger   . Amnestic MCI (mild cognitive impairment with memory loss)   .  Dyspnea   . Benign neoplasm of colon   . Diverticulosis   . Chronic venous stasis dermatitis of both lower extremities   . Purpura ([place])   . Erectile dysfunction   . Obesity   . Atrial fibrillation ([place]) 2016   Past Surgical History  Procedure Laterality Date  . Kidney transplant  09/13/2010    cadaver--at [facility]  . Tympanic membrane repair  1/12    left  . Dg angio av shunt*l*      right and left upper arms  . Fasciotomy  03/03/2012    Procedure: FASCIOTOMY;  Surgeon: [name], MD;  Location: [facility];  Service: Orthopedics;  Laterality: Right;  FASCIOTOMY RIGHT SMALL FINGER  . Cardiac catheterization      [facility]  . Coronary artery bypass graft  2005    X 4  . Colonoscopy with propofol N/A 04/22/2013    Procedure: COLONOSCOPY WITH PROPOFOL;  Surgeon: [name], MD;  Location: [facility];  Service: Endoscopy;  Laterality: N/A;  . Fasciotomy Left 08/17/2013    Procedure: FASCIOTOMY LEFT RING;  Surgeon: [name], MD;  Location: [facility];  Service: Orthopedics;  Laterality: Left;  . Bariatric surgery    . Vasectomy  Social History  Substance Use Topics  . Smoking status: Former Smoker    Types: Cigars    Quit date: 09/02/1994  . Smokeless tobacco: Never Used     Comment: cigar- once in a while  . Alcohol Use: 0.0 - 0.6 oz/week    0-1 Standard drinks or equivalent per week     Comment: occasional- 3 in the past year   Family History  Problem Relation Age of Onset  . Heart [name] Father   . Kidney failure Father   . Kidney [name] Father   . Diabetes Maternal Grandmother   . Breast cancer Maternal Grandmother   . Liver cancer Paternal Uncle   . Liver cancer Paternal Grandmother   . Prostate cancer Neg Hx     Allergies:  Allergies  Allergen Reactions  . Morphine Itching    Current antibiotics: Antibiotics Given (last 72 hours)    None      MEDICATIONS: . amLODipine  5 mg Oral Daily  . apixaban  5 mg Oral  BID  . buPROPion  75 mg Oral BID  . cefTRIAXone (ROCEPHIN)  IV  1 g Intravenous Q24H  . fluticasone  1 spray Each Nare Daily  . insulin aspart  10 Units Subcutaneous TID AC  . insulin glargine  20 Units Subcutaneous QHS  . lisinopril  40 mg Oral Daily  . multivitamin with minerals  1 tablet Oral Q supper  . mycophenolate  720 mg Oral BID  . pantoprazole  40 mg Oral QAC breakfast  . tacrolimus  1 mg Oral BID    Review of Systems - 11 systems reviewed and negative per HPI  OBJECTIVE: Temp:  [98.4 F (36.9 C)-102.4 F (39.1 C)] 100.5 F (38.1 C) (06/08 0811) Pulse Rate:  [75-108] 88 (06/08 0900) Resp:  [16-20] 20 (06/08 0811) BP: (113-149)/(51-67) 115/51 mmHg (06/08 0900) SpO2:  [90 %-95 %] 95 % (06/08 0811) Weight:  [117.028 kg (258 lb)-120.158 kg (264 lb 14.4 oz)] 120.158 kg (264 lb 14.4 oz) (06/07 2119) Physical Exam  Constitutional: He is oriented to person, place, and time. He appears well-developed and well-nourished. No distress.  HENT: anicteric, L eye with ruptured vessel, mild surrounding yellow discoloration.  Mouth/Throat: Oropharynx is clear and moist. No oropharyngeal exudate. But tonsils are somewhat erythematous Cardiovascular: Normal rate, regular rhythm and normal heart sounds.  Pulmonary/Chest: Effort normal and breath sounds normal. No respiratory distress. He has no wheezes.  Abdominal: Soft. Bowel sounds are normal. He exhibits no distension. There is no tenderness. Multiple healed scars Lymphadenopathy:  He has no cervical adenopathy.  Neurological: He is alert and oriented to person, place, and time. CN intact, strength intact Skin: Skin is warm and dry. bil LE with some chronic hyperpigmentation Ext 1+ bil LE edema Psychiatric: He has a normal mood and affect. His behavior is normal.    LABS: Results for orders placed or performed during the hospital encounter of 08/30/15 (from the past 48 hour(s))  Basic metabolic panel     Status: Abnormal    Collection Time: 08/30/15  3:31 PM  Result Value Ref Range   Sodium 132 (L) 135 - 145 mmol/L   Potassium 2.9 (LL) 3.5 - 5.1 mmol/L    Comment: CRITICAL RESULT CALLED TO, READ BACK BY AND VERIFIED WITH  JILL COTRONE AT 1609 08/30/15 SDR    Chloride 98 (L) 101 - 111 mmol/L   CO2 25 22 - 32 mmol/L   Glucose, Bld 164 (H) 65 - 99 mg/dL  BUN 22 (H) 6 - 20 mg/dL   Creatinine, Ser 1.42 (H) 0.61 - 1.24 mg/dL   Calcium 8.3 (L) 8.9 - 10.3 mg/dL   GFR calc non Af Amer 54 (L) >60 mL/min   GFR calc Af Amer >60 >60 mL/min    Comment: (NOTE) The eGFR has been calculated using the CKD EPI equation. This calculation has not been validated in all clinical situations. eGFR's persistently <60 mL/min signify possible Chronic Kidney [name].    Anion gap 9 5 - 15  CBC     Status: Abnormal   Collection Time: 08/30/15  3:31 PM  Result Value Ref Range   WBC 4.4 3.8 - 10.6 K/uL   RBC 4.70 4.40 - 5.90 MIL/uL   Hemoglobin 13.1 13.0 - 18.0 g/dL   HCT 37.9 (L) 40.0 - 52.0 %   MCV 80.6 80.0 - 100.0 fL   MCH 27.8 26.0 - 34.0 pg   MCHC 34.5 32.0 - 36.0 g/dL   RDW 14.8 (H) 11.5 - 14.5 %   Platelets 93 (L) 150 - 440 K/uL  Urinalysis complete, with microscopic     Status: Abnormal   Collection Time: 08/30/15  3:31 PM  Result Value Ref Range   Color, Urine YELLOW (A) YELLOW   APPearance CLEAR (A) CLEAR   Glucose, UA NEGATIVE NEGATIVE mg/dL   Bilirubin Urine NEGATIVE NEGATIVE   Ketones, ur NEGATIVE NEGATIVE mg/dL   Specific Gravity, Urine 1.019 1.005 - 1.030   Hgb urine dipstick NEGATIVE NEGATIVE   pH 5.0 5.0 - 8.0   Protein, ur >500 (A) NEGATIVE mg/dL   Nitrite NEGATIVE NEGATIVE   Leukocytes, UA NEGATIVE NEGATIVE   RBC / HPF 0-5 0 - 5 RBC/hpf   WBC, UA 0-5 0 - 5 WBC/hpf   Bacteria, UA RARE (A) NONE SEEN   Squamous Epithelial / LPF NONE SEEN NONE SEEN   Mucous PRESENT    Hyaline Casts, UA PRESENT    Amorphous Crystal PRESENT   Culture, blood (routine x 2)     Status: None (Preliminary result)    Collection Time: 08/30/15  4:52 PM  Result Value Ref Range   Specimen Description BLOOD RIGHT ASSIST CONTROL    Special Requests BOTTLES DRAWN AEROBIC AND ANAEROBIC  13CC    Culture NO GROWTH < 24 HOURS    Report Status PENDING   Culture, blood (routine x 2)     Status: None (Preliminary result)   Collection Time: 08/30/15  4:52 PM  Result Value Ref Range   Specimen Description BLOOD RIGHT FATTY CASTS    Special Requests      BOTTLES DRAWN AEROBIC AND ANAEROBIC  AERO 4CC ANA 1CC   Culture NO GROWTH < 24 HOURS    Report Status PENDING   Lactic acid, plasma     Status: None   Collection Time: 08/30/15  4:57 PM  Result Value Ref Range   Lactic Acid, Venous 1.1 0.5 - 2.0 mmol/L  Glucose, capillary     Status: Abnormal   Collection Time: 08/30/15  9:21 PM  Result Value Ref Range   Glucose-Capillary 156 (H) 65 - 99 mg/dL  Lactic acid, plasma     Status: None   Collection Time: 08/30/15  9:30 PM  Result Value Ref Range   Lactic Acid, Venous 0.8 0.5 - 2.0 mmol/L  Basic metabolic panel     Status: Abnormal   Collection Time: 08/31/15  4:02 AM  Result Value Ref Range   Sodium 137 135 - 145  mmol/L   Potassium 3.2 (L) 3.5 - 5.1 mmol/L   Chloride 103 101 - 111 mmol/L   CO2 26 22 - 32 mmol/L   Glucose, Bld 126 (H) 65 - 99 mg/dL   BUN 20 6 - 20 mg/dL   Creatinine, Ser 1.26 (H) 0.61 - 1.24 mg/dL   Calcium 8.4 (L) 8.9 - 10.3 mg/dL   GFR calc non Af Amer >60 >60 mL/min   GFR calc Af Amer >60 >60 mL/min    Comment: (NOTE) The eGFR has been calculated using the CKD EPI equation. This calculation has not been validated in all clinical situations. eGFR's persistently <60 mL/min signify possible Chronic Kidney [name].    Anion gap 8 5 - 15  Glucose, capillary     Status: Abnormal   Collection Time: 08/31/15  7:33 AM  Result Value Ref Range   Glucose-Capillary 131 (H) 65 - 99 mg/dL  Glucose, capillary     Status: Abnormal   Collection Time: 08/31/15 11:31 AM  Result Value Ref Range    Glucose-Capillary 159 (H) 65 - 99 mg/dL   No components found for: ESR, C REACTIVE PROTEIN MICRO: Recent Results (from the past 720 hour(s))  Culture, blood (routine x 2)     Status: None (Preliminary result)   Collection Time: 08/30/15  4:52 PM  Result Value Ref Range Status   Specimen Description BLOOD RIGHT ASSIST CONTROL  Final   Special Requests BOTTLES DRAWN AEROBIC AND ANAEROBIC  13CC  Final   Culture NO GROWTH < 24 HOURS  Final   Report Status PENDING  Incomplete  Culture, blood (routine x 2)     Status: None (Preliminary result)   Collection Time: 08/30/15  4:52 PM  Result Value Ref Range Status   Specimen Description BLOOD RIGHT FATTY CASTS  Final   Special Requests   Final    BOTTLES DRAWN AEROBIC AND ANAEROBIC  AERO 4CC ANA 1CC   Culture NO GROWTH < 24 HOURS  Final   Report Status PENDING  Incomplete    IMAGING: Dg Chest 2 View  08/30/2015  CLINICAL DATA:  55 year old male with generalized weakness, dizziness, joint pain, fever and chills for the past week EXAM: CHEST  2 VIEW COMPARISON:  Prior chest x-ray 10/13/2014 FINDINGS: Stable cardiomegaly. Status post median sternotomy with evidence of prior multivessel CABG. Bulky calcified right mediastinal lymphadenopathy. Mild vascular congestion without overt edema appears stable air compared to prior. No focal airspace consolidation to suggest pneumonia. No pleural effusion or pneumothorax. Osseous structures are intact. Mild degenerative endplate changes. IMPRESSION: Stable chest x-ray without acute cardiopulmonary process. Electronically Signed   By: Jacqulynn Cadet M.D.   On: 08/30/2015 16:43   US Venous Img Lower Unilateral Left  08/18/2015  CLINICAL DATA:  Left leg swelling EXAM: LEFT LOWER EXTREMITY VENOUS DOPPLER ULTRASOUND TECHNIQUE: Gray-scale sonography with graded compression, as well as color Doppler and duplex ultrasound were performed to evaluate the lower extremity deep venous systems from the level of the common  femoral vein and including the common femoral, femoral, profunda femoral, popliteal and calf veins including the posterior tibial, peroneal and gastrocnemius veins when visible. The superficial great saphenous vein was also interrogated. Spectral Doppler was utilized to evaluate flow at rest and with distal augmentation maneuvers in the common femoral, femoral and popliteal veins. COMPARISON:  None. FINDINGS: Contralateral Common Femoral Vein: Respiratory phasicity is normal and symmetric with the symptomatic side. No evidence of thrombus. Normal compressibility. Common Femoral Vein: No evidence of thrombus.  Normal compressibility, respiratory phasicity and response to augmentation. Saphenofemoral Junction: No evidence of thrombus. Normal compressibility and flow on color Doppler imaging. Profunda Femoral Vein: No evidence of thrombus. Normal compressibility and flow on color Doppler imaging. Femoral Vein: No evidence of thrombus. Normal compressibility, respiratory phasicity and response to augmentation. Popliteal Vein: No evidence of thrombus. Normal compressibility, respiratory phasicity and response to augmentation. Calf Veins: No evidence of thrombus. Normal compressibility and flow on color Doppler imaging. Superficial Great Saphenous Vein: No evidence of thrombus. Normal compressibility and flow on color Doppler imaging. Venous Reflux:  None. Other Findings:  None. IMPRESSION: No evidence of deep venous thrombosis. Electronically Signed   By: Rolm Baptise M.D.   On: 08/18/2015 15:12    Assessment:   [name] is a 55 y.o. male with a febrile illness, in setting of immunosuppression for renal transplant. He has some mild HA, some liquid stools, mild ST and Post nasal drip, and some LE chronic venous stasis changes but no cellulitis.  Recent return from [place] with only unusual exposure at a goat farm and had unpasteurized goat cheese. CXR negative, no hypoxia. WBC nml, low plts (but has had in past).  UA unimpressive Pending BCX, UCX, CMV, ebv and BK virus PCRs.   Diff dx includes bacteremia, GI virus, listeriosis, CMV, EBV, BK virus, rejection.  Recommendations Check LFTs Change ceftriaxone to unasyn  Add doxycycline empirically Await further results.   Thank you very much for allowing me to participate in the care of this patient. Please call with questions.   Cheral Marker. Ola Spurr, MD

## 2015-08-31 NOTE — Progress Notes (Signed)
Pt's BP running 115/51, paged Dr. Posey Pronto and voiced concern about giving AM doses of Lisinopril and Norvasc. Order received to hold Am doses, will continue to monitor. Villa Herb RN-BC

## 2015-08-31 NOTE — Progress Notes (Signed)
[facility] [place] NAME: [name]    MR#:  [id number]  DATE OF BIRTH:  12-07-1960  SUBJECTIVE:  CHIEF COMPLAINT:   Chief Complaint  Patient presents with  . Fever  . Joint Pain   -Returned from his [place] trip about 3 weeks ago, arthralgias and large joints since then - fevers since admission, cultures pending - Feels weak  REVIEW OF SYSTEMS:  Review of Systems  Constitutional: Positive for fever, chills and malaise/fatigue.  HENT: Negative for ear discharge, ear pain and nosebleeds.   Eyes: Negative for blurred vision and double vision.  Respiratory: Negative for cough, shortness of breath and wheezing.   Cardiovascular: Negative for chest pain, palpitations and leg swelling.  Gastrointestinal: Negative for nausea, vomiting, abdominal pain, diarrhea and constipation.  Genitourinary: Negative for dysuria.  Musculoskeletal: Negative for myalgias.  Neurological: Negative for dizziness, seizures and headaches.  Psychiatric/Behavioral: Negative for depression.    DRUG ALLERGIES:   Allergies  Allergen Reactions  . Morphine Itching    VITALS:  Blood pressure 127/56, pulse 71, temperature 99 F (37.2 C), temperature source Oral, resp. rate 17, height _0  (1.803 m), weight 120.158 kg (264 lb 14.4 oz), SpO2 95 %.  PHYSICAL EXAMINATION:  Physical Exam  GENERAL:  55 y.o.-year-old patient lying in the bed with no acute distress.  EYES: Pupils equal, round, reactive to light and accommodation. No scleral icterus. Extraocular muscles intact. Nasolabial folds rash from CPAP mask HEENT: Head atraumatic, normocephalic. Oropharynx and nasopharynx clear.  NECK:  Supple, no jugular venous distention. No thyroid enlargement, no tenderness.  LUNGS: Normal breath sounds bilaterally, no wheezing, rales,rhonchi or crepitation. No use of accessory muscles of respiration.  CARDIOVASCULAR: S1, S2 normal. No murmurs, rubs, or gallops.   ABDOMEN: Soft, nontender, nondistended. Bowel sounds present. No organomegaly or mass.  EXTREMITIES: No pedal edema, cyanosis, or clubbing. No erythema or tenderness of the joints NEUROLOGIC: Cranial nerves II through XII are intact. Muscle strength 5/5 in all extremities. Sensation intact. Gait not checked.  PSYCHIATRIC: The patient is alert and oriented x 3.  SKIN: No obvious rash, lesion, or ulcer.    LABORATORY PANEL:   CBC  Recent Labs Lab 08/30/15 1531  WBC 4.4  HGB 13.1  HCT 37.9*  PLT 93*   ------------------------------------------------------------------------------------------------------------------  Chemistries   Recent Labs Lab 08/31/15 0402  NA 137  K 3.2*  CL 103  CO2 26  GLUCOSE 126*  BUN 20  CREATININE 1.26*  CALCIUM 8.4*  AST 46*  ALT 27  ALKPHOS 128*  BILITOT 1.5*   ------------------------------------------------------------------------------------------------------------------  Cardiac Enzymes No results for input(s): TROPONINI in the last 168 hours. ------------------------------------------------------------------------------------------------------------------  RADIOLOGY:  Dg Chest 2 View  08/30/2015  CLINICAL DATA:  55 year old male with generalized weakness, dizziness, joint pain, fever and chills for the past week EXAM: CHEST  2 VIEW COMPARISON:  Prior chest x-ray 10/13/2014 FINDINGS: Stable cardiomegaly. Status post median sternotomy with evidence of prior multivessel CABG. Bulky calcified right mediastinal lymphadenopathy. Mild vascular congestion without overt edema appears stable air compared to prior. No focal airspace consolidation to suggest pneumonia. No pleural effusion or pneumothorax. Osseous structures are intact. Mild degenerative endplate changes. IMPRESSION: Stable chest x-ray without acute cardiopulmonary process. Electronically Signed   By: Jacqulynn Cadet M.D.   On: 08/30/2015 16:43    EKG:   Orders placed or  performed during the hospital encounter of 08/30/15  . EKG 12-Lead  . EKG 12-Lead  .  ED EKG  . ED EKG    ASSESSMENT AND PLAN:   55 year old male with history of end-stage renal disease status post renal transplant, hypertension, diabetes, sleep apnea on CPAP presents to the hospital secondary to fevers and chills.  #1 fevers and chills-viral syndrome versus extreme anemia versus tic disease versus listeriosis -Appreciate infectious disease consult -Started on Unasyn and doxycycline. -EBV, CMV, BK virus PCR pending -Continue fluids and supportive treatment.  #2 acute renal failure-likely prerenal. Baseline creatinine around 0.9. -Hold off to urinate takes. Improving with fluids. -Continue to monitor. Nephrology consult if needed  #3 S/p Renal transplant- cont meds- tacrolimus and mycophenolate  #4 Chronic afib- rate controlled. On eliquis for anticoagulation.   #5 hypertension-on lisinopril, Norvasc  #6 diabetes mellitus-on Lantus and sliding scale insulin  #7 hypokalemia-potassium supplements in the fluids  #8 DVT prophylaxis-on eliquis    All the records are reviewed and case discussed with Care Management/Social Workerr. Management plans discussed with the patient, family and they are in agreement.  CODE STATUS: Full code  TOTAL TIME TAKING CARE OF THIS PATIENT: 37 minutes.   POSSIBLE D/C IN 1-2 DAYS, DEPENDING ON CLINICAL CONDITION.   Gladstone Lighter M.D on 08/31/2015 at 2:57 PM  Between 7am to 6pm - Pager - [phone]  After 6pm go to [email or web address] - password [email or web address] [facility]  Office  [phone]  CC: Primary care physician; [name], MD

## 2015-08-31 NOTE — Progress Notes (Signed)
Pharmacy Antibiotic Note  [name] is a 54 y.o. male with a h/o renal transplant admitted on 08/30/2015 with ARF and sepsis.  Pharmacy has been consulted for Unasyn dosing. Patient is also ordered doxycycline.   Plan: Unasyn 3 g iv q 6 hours.   Height: _0  (180.3 cm) Weight: 264 lb 14.4 oz (120.158 kg) IBW/kg (Calculated) : 75.3  Temp (24hrs), Avg:100.1 F (37.8 C), Min:98.4 F (36.9 C), Max:102.4 F (39.1 C)   Recent Labs Lab 08/30/15 1531 08/30/15 1657 08/30/15 2130 08/31/15 0402  WBC 4.4  --   --   --   CREATININE 1.42*  --   --  1.26*  LATICACIDVEN  --  1.1 0.8  --     Estimated Creatinine Clearance: 87.4 mL/min (by C-G formula based on Cr of 1.26).    Allergies  Allergen Reactions  . Morphine Itching    Antimicrobials this admission: ceftriaxone 6/7 >> 6/8 Unasyn 6/8 >>  Doxycycline 6/8 >>  Dose adjustments this admission:   Microbiology results: 6/7 BCx: NGTD 6/7 UCx: pending  GI panel: pending EBV: pending CMV: pending BK virus: pending  Thank you for allowing pharmacy to be a part of this patient's care.  Ulice Dash D 08/31/2015 1:07 PM

## 2015-09-01 ENCOUNTER — Inpatient Hospital Stay: Payer: 59

## 2015-09-01 LAB — CBC
HCT: 36.2 % — ABNORMAL LOW (ref 40.0–52.0)
HEMOGLOBIN: 12 g/dL — AB (ref 13.0–18.0)
MCH: 27.3 pg (ref 26.0–34.0)
MCHC: 33.3 g/dL (ref 32.0–36.0)
MCV: 82 fL (ref 80.0–100.0)
PLATELETS: 80 10*3/uL — AB (ref 150–440)
RBC: 4.41 MIL/uL (ref 4.40–5.90)
RDW: 15 % — ABNORMAL HIGH (ref 11.5–14.5)
WBC: 4.5 10*3/uL (ref 3.8–10.6)

## 2015-09-01 LAB — MISC LABCORP TEST (SEND OUT): Labcorp test code: 138289

## 2015-09-01 LAB — GLUCOSE, CAPILLARY
GLUCOSE-CAPILLARY: 117 mg/dL — AB (ref 65–99)
GLUCOSE-CAPILLARY: 154 mg/dL — AB (ref 65–99)
Glucose-Capillary: 169 mg/dL — ABNORMAL HIGH (ref 65–99)
Glucose-Capillary: 111 mg/dL — ABNORMAL HIGH (ref 65–99)

## 2015-09-01 LAB — CMV DNA BY PCR, QUALITATIVE: CMV DNA QL PCR: POSITIVE — AB

## 2015-09-01 LAB — BASIC METABOLIC PANEL
ANION GAP: 5 (ref 5–15)
BUN: 19 mg/dL (ref 6–20)
CALCIUM: 8.3 mg/dL — AB (ref 8.9–10.3)
CHLORIDE: 107 mmol/L (ref 101–111)
CO2: 24 mmol/L (ref 22–32)
CREATININE: 1.21 mg/dL (ref 0.61–1.24)
GFR calc Af Amer: >60 mL/min (ref >60)
GFR calc non Af Amer: >60 mL/min (ref >60)
GLUCOSE: 117 mg/dL — AB (ref 65–99)
Potassium: 3.7 mmol/L (ref 3.5–5.1)
Sodium: 136 mmol/L (ref 135–145)

## 2015-09-01 MED ORDER — SULFAMETHOXAZOLE-TRIMETHOPRIM 800-160 MG PO TABS
1.0000 | ORAL_TABLET | ORAL | Status: DC
  Administered 2015-09-01 – 2015-09-04 (×2): 1 via ORAL
  Filled 2015-09-01 (×2): qty 1

## 2015-09-01 MED ORDER — FUROSEMIDE 10 MG/ML IJ SOLN
20.0000 mg | Freq: Once | INTRAMUSCULAR | Status: AC
  Administered 2015-09-01: 20 mg via INTRAVENOUS
  Filled 2015-09-01: qty 2

## 2015-09-01 MED ORDER — POTASSIUM CHLORIDE CRYS ER 20 MEQ PO TBCR
20.0000 meq | EXTENDED_RELEASE_TABLET | Freq: Once | ORAL | Status: AC
  Administered 2015-09-01: 20 meq via ORAL
  Filled 2015-09-01: qty 1

## 2015-09-01 NOTE — Care Management Important Message (Signed)
Important Message  Patient Details  Name: [name] MRN: [id number] Date of Birth: [birth year]   Medicare Important Message Given:  Yes    Marshell Garfinkel, RN 09/01/2015, 2:58 PM

## 2015-09-01 NOTE — Progress Notes (Signed)
[name] is a 55 y.o. male with  Fever and renal transplant Active Problems:   Acute renal failure (ARF) (HCC)  Subjective: Still febrile, some hypoxia, CXR with pulm vasc congestion However he says he is feeling better  ROS  Eleven systems are reviewed and negative except per hpi  Medications:  Antibiotics Given (last 72 hours)    Date/Time Action Medication Dose Rate   08/31/15 1404 Given   Ampicillin-Sulbactam (UNASYN) 3 g in sodium chloride 0.9 % 100 mL IVPB 3 g 100 mL/hr   08/31/15 1409 Given   doxycycline (VIBRA-TABS) tablet 100 mg 100 mg    08/31/15 1703 Given   Ampicillin-Sulbactam (UNASYN) 3 g in sodium chloride 0.9 % 100 mL IVPB 3 g 100 mL/hr   08/31/15 2111 Given   doxycycline (VIBRA-TABS) tablet 100 mg 100 mg    09/01/15 0128 Given   Ampicillin-Sulbactam (UNASYN) 3 g in sodium chloride 0.9 % 100 mL IVPB 3 g 100 mL/hr   09/01/15 0935 Given   Ampicillin-Sulbactam (UNASYN) 3 g in sodium chloride 0.9 % 100 mL IVPB 3 g 100 mL/hr   09/01/15 0936 Given   doxycycline (VIBRA-TABS) tablet 100 mg 100 mg      . amLODipine  5 mg Oral Daily  . ampicillin-sulbactam (UNASYN) IV  3 g Intravenous Q6H  . apixaban  5 mg Oral BID  . buPROPion  75 mg Oral BID  . doxycycline  100 mg Oral Q12H  . fluticasone  1 spray Each Nare Daily  . furosemide  20 mg Intravenous Once  . insulin aspart  0-5 Units Subcutaneous QHS  . insulin aspart  0-9 Units Subcutaneous TID WC  . insulin glargine  20 Units Subcutaneous QHS  . lisinopril  40 mg Oral Daily  . multivitamin with minerals  1 tablet Oral Q supper  . mycophenolate  720 mg Oral BID  . pantoprazole  40 mg Oral QAC breakfast  . potassium chloride  20 mEq Oral Once  . tacrolimus  1 mg Oral BID    Objective: Vital signs in last 24 hours: Temp:  [99.1 F (37.3 C)-102.9 F (39.4 C)] 99.9 F (37.7 C) (06/09 0416) Pulse Rate:  [87-99] 87 (06/09  0416) Resp:  [18] 18 (06/09 0416) BP: (137-147)/(50-68) 137/50 mmHg (06/09 0416) SpO2:  [87 %-91 %] 91 % (06/09 0508) Weight:  [122.335 kg (269 lb 11.2 oz)] 122.335 kg (269 lb 11.2 oz) (06/09 0501) Constitutional: He is oriented to person, place, and time. He appears well-developed and well-nourished. No distress.  HENT: anicteric, L eye with ruptured vessel, mild surrounding yellow discoloration.  Mouth/Throat: Oropharynx is clear and moist. No oropharyngeal exudate. But tonsils are somewhat erythematous Cardiovascular: Normal rate, regular rhythm and normal heart sounds.  Pulmonary/Chest: Effort normal and breath sounds normal. No respiratory distress. He has no wheezes.  Abdominal: Soft. Bowel sounds are normal. He exhibits no distension. There is no tenderness. Multiple healed scars Lymphadenopathy: He has no cervical adenopathy.  Neurological: He is alert and oriented to person, place, and time. CN intact, strength intact Skin: Skin is warm and dry. bil LE with some chronic hyperpigmentation Ext 1+ bil LE edema Psychiatric: He has a normal mood and affect. His behavior is normal.   Lab Results  Recent Labs  08/30/15 1531 08/31/15 0402 09/01/15 0439  WBC 4.4  --  4.5  HGB 13.1  --  12.0*  HCT 37.9*  --  36.2*  NA 132* 137 136  K 2.9* 3.2* 3.7  CL 98* 103 107  CO2 _0 BUN 22* 20 19  CREATININE 1.42* 1.26* 1.21    Microbiology: Results for orders placed or performed during the hospital encounter of 08/30/15  Urine culture     Status: None   Collection Time: 08/30/15  3:31 PM  Result Value Ref Range Status   Specimen Description URINE, RANDOM  Final   Special Requests NONE  Final   Culture NO GROWTH Performed at [facility]   Final   Report Status 08/31/2015 FINAL  Final  Culture, blood (routine x 2)     Status: None (Preliminary result)   Collection Time: 08/30/15  4:52 PM  Result Value Ref Range Status   Specimen Description BLOOD RIGHT ASSIST  CONTROL  Final   Special Requests BOTTLES DRAWN AEROBIC AND ANAEROBIC  13CC  Final   Culture NO GROWTH 2 DAYS  Final   Report Status PENDING  Incomplete  Culture, blood (routine x 2)     Status: None (Preliminary result)   Collection Time: 08/30/15  4:52 PM  Result Value Ref Range Status   Specimen Description BLOOD RIGHT FATTY CASTS  Final   Special Requests   Final    BOTTLES DRAWN AEROBIC AND ANAEROBIC  AERO 4CC ANA [place]   Culture NO GROWTH 2 DAYS  Final   Report Status PENDING  Incomplete  Gastrointestinal Panel by PCR , Stool     Status: None   Collection Time: 08/31/15  2:48 PM  Result Value Ref Range Status   Campylobacter species NOT DETECTED NOT DETECTED Final   Plesimonas shigelloides NOT DETECTED NOT DETECTED Final   Salmonella species NOT DETECTED NOT DETECTED Final   Yersinia enterocolitica NOT DETECTED NOT DETECTED Final   Vibrio species NOT DETECTED NOT DETECTED Final   Vibrio cholerae NOT DETECTED NOT DETECTED Final   Enteroaggregative E coli (EAEC) NOT DETECTED NOT DETECTED Final   Enteropathogenic E coli (EPEC) NOT DETECTED NOT DETECTED Final   Enterotoxigenic E coli (ETEC) NOT DETECTED NOT DETECTED Final   Shiga like toxin producing E coli (STEC) NOT DETECTED NOT DETECTED Final   E. coli O157 NOT DETECTED NOT DETECTED Final   Shigella/Enteroinvasive E coli (EIEC) NOT DETECTED NOT DETECTED Final   Cryptosporidium NOT DETECTED NOT DETECTED Final   Cyclospora cayetanensis NOT DETECTED NOT DETECTED Final   Entamoeba histolytica NOT DETECTED NOT DETECTED Final   Giardia lamblia NOT DETECTED NOT DETECTED Final   Adenovirus F40/41 NOT DETECTED NOT DETECTED Final   Astrovirus NOT DETECTED NOT DETECTED Final   Norovirus GI/GII NOT DETECTED NOT DETECTED Final   Rotavirus A NOT DETECTED NOT DETECTED Final   Sapovirus (I, II, IV, and V) NOT DETECTED NOT DETECTED Final  C difficile quick scan w PCR reflex     Status: None   Collection Time: 08/31/15  2:48 PM  Result Value  Ref Range Status   C Diff antigen NEGATIVE NEGATIVE Final   C Diff toxin NEGATIVE NEGATIVE Final   C Diff interpretation Negative for C. difficile  Final    Studies/Results: Dg Chest 2 View  09/01/2015  CLINICAL DATA:  Fevers.  Shortness of breath. EXAM: CHEST  2 VIEW COMPARISON:  08/30/15 FINDINGS: There is moderate cardiac enlargement. Previous median sternotomy and CABG procedure. Pulmonary vascular congestion. No pleural effusion or edema. IMPRESSION: 1. Cardiac enlargement and pulmonary vascular congestion. Electronically Signed  By: Kerby Moors M.D.   On: 09/01/2015 08:33   Dg Chest 2 View  08/30/2015  CLINICAL DATA:  55 year old male with generalized weakness, dizziness, joint pain, fever and chills for the past week EXAM: CHEST  2 VIEW COMPARISON:  Prior chest x-ray 10/13/2014 FINDINGS: Stable cardiomegaly. Status post median sternotomy with evidence of prior multivessel CABG. Bulky calcified right mediastinal lymphadenopathy. Mild vascular congestion without overt edema appears stable air compared to prior. No focal airspace consolidation to suggest pneumonia. No pleural effusion or pneumothorax. Osseous structures are intact. Mild degenerative endplate changes. IMPRESSION: Stable chest x-ray without acute cardiopulmonary process. Electronically Signed   By: Jacqulynn Cadet M.D.   On: 08/30/2015 16:43    Assessment/Plan: BRAEDYN KAUK is a 55 y.o. male with a febrile illness, in setting of immunosuppression for renal transplant. He has some mild HA, some liquid stools, mild ST and Post nasal drip, and some LE chronic venous stasis changes but no cellulitis. Recent return from [place] with only unusual exposure at a goat farm and had unpasteurized goat cheese. CXR negative, no hypoxia. WBC nml, low plts (but has had in past). UA unimpressive Pending BCX, UCX, CMV, ebv and BK virus PCRs.  LFT with slight incrase ap, ast and TB.   He reports that he has been on suppresive bactrim MWF but  I do think PCP is still a possibility  Diff dx includes bacteremia, PCP GI virus, listeriosis, CMV, EBV, BK virus, rejection.   Recommendations Cont  unasyn and doxycycline empirically Restart MWF bactrim for PCP PPX Await further results.  If hypoxia worsens would check CT chest and consider starting treatment doses of bactrim for PCP Thank you very much for the consult. Will follow with you.  Delanee Xin P   09/01/2015, 2:03 PM

## 2015-09-01 NOTE — Progress Notes (Signed)
Pharmacy Antibiotic Note  [name] is a 55 y.o. male with a h/o renal transplant admitted on 08/30/2015 with ARF and sepsis.  Pharmacy has been consulted for Unasyn dosing. Patient is also ordered doxycycline.   Plan: Unasyn 3 g iv q 6 hours.   Height: _0  (180.3 cm) Weight: 269 lb 11.2 oz (122.335 kg) IBW/kg (Calculated) : 75.3  Temp (24hrs), Avg:100.9 F (38.3 C), Min:99.1 F (37.3 C), Max:102.9 F (39.4 C)   Recent Labs Lab 08/30/15 1531 08/30/15 1657 08/30/15 2130 08/31/15 0402 09/01/15 0439  WBC 4.4  --   --   --  4.5  CREATININE 1.42*  --   --  1.26* 1.21  LATICACIDVEN  --  1.1 0.8  --   --     Estimated Creatinine Clearance: 91.8 mL/min (by C-G formula based on Cr of 1.21).    Allergies  Allergen Reactions  . Morphine Itching    Antimicrobials this admission: ceftriaxone 6/7 >> 6/8 Unasyn 6/8 >>  Doxycycline 6/8 >>  Dose adjustments this admission:   Microbiology results: 6/7 BCx: NGTD 6/7 UCx: pending  GI panel: pending EBV: pending CMV: pending BK virus: pending  Thank you for allowing pharmacy to be a part of this patient's care.  Florence Antonelli C 09/01/2015 3:57 PM

## 2015-09-01 NOTE — Progress Notes (Addendum)
[place] at [place] NAME: [name]    MR#:  [id number]  DATE OF BIRTH:  1960/06/13  SUBJECTIVE:  CHIEF COMPLAINT:   Chief Complaint  Patient presents with  . Fever  . Joint Pain   -clinically feels better today. Hypoxic last night. Couldn't keep his CPAP on. -Joint pains are much better. Still spiking fevers, MAXIMUM TEMPERATURE of 102.49F last night  REVIEW OF SYSTEMS:  Review of Systems  Constitutional: Positive for fever, chills and malaise/fatigue.  HENT: Negative for ear discharge, ear pain and nosebleeds.   Eyes: Negative for blurred vision and double vision.  Respiratory: Positive for shortness of breath. Negative for cough and wheezing.   Cardiovascular: Negative for chest pain, palpitations and leg swelling.  Gastrointestinal: Negative for nausea, vomiting, abdominal pain, diarrhea and constipation.  Genitourinary: Negative for dysuria.  Musculoskeletal: Negative for myalgias.  Neurological: Negative for dizziness, seizures and headaches.  Psychiatric/Behavioral: Negative for depression.    DRUG ALLERGIES:   Allergies  Allergen Reactions  . Morphine Itching    VITALS:  Blood pressure 137/50, pulse 87, temperature 99.9 F (37.7 C), temperature source Oral, resp. rate 18, height 5' 11" (1.803 m), weight 122.335 kg (269 lb 11.2 oz), SpO2 91 %.  PHYSICAL EXAMINATION:  Physical Exam  GENERAL:  55 y.o.-year-old patient lying in the bed with no acute distress.  EYES: Pupils equal, round, reactive to light and accommodation. No scleral icterus. Extraocular muscles intact. Nasolabial folds rash from CPAP mask HEENT: Head atraumatic, normocephalic. Oropharynx and nasopharynx clear.  NECK:  Supple, no jugular venous distention. No thyroid enlargement, no tenderness.  LUNGS: Normal breath sounds bilaterally, no wheezing, rales,rhonchi or crepitation. No use of accessory muscles of respiration.  CARDIOVASCULAR: S1,  S2 normal. No murmurs, rubs, or gallops.  ABDOMEN: Soft, nontender, nondistended. Bowel sounds present. No organomegaly or mass.  EXTREMITIES: No pedal edema, cyanosis, or clubbing. No erythema or tenderness of the joints NEUROLOGIC: Cranial nerves II through XII are intact. Muscle strength 5/5 in all extremities. Sensation intact. Gait not checked.  PSYCHIATRIC: The patient is alert and oriented x 3.  SKIN: No obvious rash, lesion, or ulcer.    LABORATORY PANEL:   CBC  Recent Labs Lab 09/01/15 0439  WBC 4.5  HGB 12.0*  HCT 36.2*  PLT 80*   ------------------------------------------------------------------------------------------------------------------  Chemistries   Recent Labs Lab 08/31/15 0402 09/01/15 0439  NA 137 136  K 3.2* 3.7  CL 103 107  CO2 26 24  GLUCOSE 126* 117*  BUN 20 19  CREATININE 1.26* 1.21  CALCIUM 8.4* 8.3*  AST 46*  --   ALT 27  --   ALKPHOS 128*  --   BILITOT 1.5*  --    ------------------------------------------------------------------------------------------------------------------  Cardiac Enzymes No results for input(s): TROPONINI in the last 168 hours. ------------------------------------------------------------------------------------------------------------------  RADIOLOGY:  Dg Chest 2 View  09/01/2015  CLINICAL DATA:  Fevers.  Shortness of breath. EXAM: CHEST  2 VIEW COMPARISON:  08/30/15 FINDINGS: There is moderate cardiac enlargement. Previous median sternotomy and CABG procedure. Pulmonary vascular congestion. No pleural effusion or edema. IMPRESSION: 1. Cardiac enlargement and pulmonary vascular congestion. Electronically Signed   By: Kerby Moors M.D.   On: 09/01/2015 08:33   Dg Chest 2 View  08/30/2015  CLINICAL DATA:  55 year old male with generalized weakness, dizziness, joint pain, fever and chills for the past week EXAM: CHEST  2 VIEW COMPARISON:  Prior chest x-ray 10/13/2014 FINDINGS: Stable cardiomegaly. Status post median  sternotomy with evidence of prior multivessel CABG. Bulky calcified right mediastinal lymphadenopathy. Mild vascular congestion without overt edema appears stable air compared to prior. No focal airspace consolidation to suggest pneumonia. No pleural effusion or pneumothorax. Osseous structures are intact. Mild degenerative endplate changes. IMPRESSION: Stable chest x-ray without acute cardiopulmonary process. Electronically Signed   By: Jacqulynn Cadet M.D.   On: 08/30/2015 16:43    EKG:   Orders placed or performed during the hospital encounter of 08/30/15  . EKG 12-Lead  . EKG 12-Lead  . ED EKG  . ED EKG    ASSESSMENT AND PLAN:   55 year old male with history of end-stage renal disease status post renal transplant, hypertension, diabetes, sleep apnea on CPAP presents to the hospital secondary to fevers and chills.  #1 fevers and chills-viral syndrome versus  gastroenteritis versus listeriosis in the setting of immunosuppression -Appreciate infectious disease consult -on Unasyn and doxycycline. -EBV, CMV, BK virus PCR pending, blood cultures negative so far -Continue supportive treatment.  #2 acute renal failure-likely prerenal. Baseline creatinine around 0.9. - Improved upto 1.2, one dose of lasix today as pulm vascular congestion with IV fluids and hypoxia -Continue to monitor. Nephrology consult if needed  #3 S/p Renal transplant- cont meds- tacrolimus and mycophenolate  #4 Chronic afib- rate controlled. On eliquis for anticoagulation.  pulm vascular congestion on CXR- discontinue IV fluids and a small dose of lasix today  #5 hypertension-on lisinopril, Norvasc  #6 diabetes mellitus-on Lantus and sliding scale insulin  #7 hypokalemia-potassium supplements  #8 DVT prophylaxis-on eliquis   Discharge once fevers improve   All the records are reviewed and case discussed with Care Management/Social Workerr. Management plans discussed with the patient, family and they are  in agreement.  CODE STATUS: Full code  TOTAL TIME TAKING CARE OF THIS PATIENT: 37 minutes.   POSSIBLE D/C IN 1-2 DAYS, DEPENDING ON CLINICAL CONDITION.   Gladstone Lighter M.D on 09/01/2015 at 1:58 PM  Between 7am to 6pm - Pager - [phone]  After 6pm go to [email or web address] - password [email or web address] [facility]  Office  [phone]  CC: Primary care physician; [name], MD

## 2015-09-02 LAB — CBC
HCT: 36.8 % — ABNORMAL LOW (ref 40.0–52.0)
HEMOGLOBIN: 12.5 g/dL — AB (ref 13.0–18.0)
MCH: 28.3 pg (ref 26.0–34.0)
MCHC: 33.9 g/dL (ref 32.0–36.0)
MCV: 83.3 fL (ref 80.0–100.0)
Platelets: 82 10*3/uL — ABNORMAL LOW (ref 150–440)
RBC: 4.42 MIL/uL (ref 4.40–5.90)
RDW: 15.2 % — ABNORMAL HIGH (ref 11.5–14.5)
WBC: 4.6 10*3/uL (ref 3.8–10.6)

## 2015-09-02 LAB — BASIC METABOLIC PANEL
ANION GAP: 5 (ref 5–15)
BUN: 19 mg/dL (ref 6–20)
CALCIUM: 8.4 mg/dL — AB (ref 8.9–10.3)
CHLORIDE: 106 mmol/L (ref 101–111)
CO2: 24 mmol/L (ref 22–32)
Creatinine, Ser: 1.18 mg/dL (ref 0.61–1.24)
GFR calc non Af Amer: >60 mL/min (ref >60)
Glucose, Bld: 134 mg/dL — ABNORMAL HIGH (ref 65–99)
Potassium: 3.6 mmol/L (ref 3.5–5.1)
Sodium: 135 mmol/L (ref 135–145)

## 2015-09-02 LAB — GLUCOSE, CAPILLARY
GLUCOSE-CAPILLARY: 204 mg/dL — AB (ref 65–99)
GLUCOSE-CAPILLARY: 157 mg/dL — AB (ref 65–99)
Glucose-Capillary: 149 mg/dL — ABNORMAL HIGH (ref 65–99)

## 2015-09-02 MED ORDER — FUROSEMIDE 40 MG PO TABS
40.0000 mg | ORAL_TABLET | Freq: Every day | ORAL | Status: DC
  Administered 2015-09-02: 40 mg via ORAL
  Filled 2015-09-02: qty 1

## 2015-09-02 NOTE — Progress Notes (Signed)
[place] at [place] NAME: [name]    MR#:  [id number]  DATE OF BIRTH:  1960/04/01  SUBJECTIVE:   Patient is doing well. He had a fever last night. He still has 2-3 loose stools. He denies abdominal pain.   REVIEW OF SYSTEMS:    Review of Systems  Constitutional: Positive for fever. Negative for chills and malaise/fatigue.  HENT: Negative for ear discharge, ear pain, hearing loss, nosebleeds and sore throat.   Eyes: Negative for blurred vision and pain.  Respiratory: Negative for cough, hemoptysis, shortness of breath and wheezing.   Cardiovascular: Positive for leg swelling. Negative for chest pain and palpitations.  Gastrointestinal: Positive for diarrhea. Negative for nausea, vomiting, abdominal pain and blood in stool.  Genitourinary: Negative for dysuria.  Musculoskeletal: Negative for back pain.  Neurological: Negative for dizziness, tremors, speech change, focal weakness, seizures and headaches.  Endo/Heme/Allergies: Does not bruise/bleed easily.  Psychiatric/Behavioral: Negative for depression, suicidal ideas and hallucinations.    Tolerating Diet: yes      DRUG ALLERGIES:   Allergies  Allergen Reactions  . Morphine Itching    VITALS:  Blood pressure 150/65, pulse 89, temperature 98.9 F (37.2 C), temperature source Oral, resp. rate 18, height _0  (1.803 m), weight 122.335 kg (269 lb 11.2 oz), SpO2 91 %.  PHYSICAL EXAMINATION:   Physical Exam  Constitutional: He is oriented to person, place, and time and well-developed, well-nourished, and in no distress. No distress.  HENT:  Head: Normocephalic.  Eyes: No scleral icterus.  Neck: Normal range of motion. Neck supple. No JVD present. No tracheal deviation present.  Cardiovascular: Normal rate, regular rhythm and normal heart sounds.  Exam reveals no gallop and no friction rub.   No murmur heard. Pulmonary/Chest: Effort normal and breath sounds normal. No  respiratory distress. He has no wheezes. He has no rales. He exhibits no tenderness.  Abdominal: Soft. Bowel sounds are normal. He exhibits no distension and no mass. There is no tenderness. There is no rebound and no guarding.  Musculoskeletal: Normal range of motion. He exhibits edema.  Neurological: He is alert and oriented to person, place, and time.  Skin: Skin is warm. No rash noted. No erythema.  Psychiatric: Affect and judgment normal.      LABORATORY PANEL:   CBC  Recent Labs Lab 09/02/15 0528  WBC 4.6  HGB 12.5*  HCT 36.8*  PLT 82*   ------------------------------------------------------------------------------------------------------------------  Chemistries   Recent Labs Lab 08/31/15 0402  09/02/15 0528  NA 137  < > 135  K 3.2*  < > 3.6  CL 103  < > 106  CO2 26  < > 24  GLUCOSE 126*  < > 134*  BUN 20  < > 19  CREATININE 1.26*  < > 1.18  CALCIUM 8.4*  < > 8.4*  AST 46*  --   --   ALT 27  --   --   ALKPHOS 128*  --   --   BILITOT 1.5*  --   --   < > = values in this interval not displayed. ------------------------------------------------------------------------------------------------------------------  Cardiac Enzymes No results for input(s): TROPONINI in the last 168 hours. ------------------------------------------------------------------------------------------------------------------  RADIOLOGY:  Dg Chest 2 View  09/01/2015  CLINICAL DATA:  Fevers.  Shortness of breath. EXAM: CHEST  2 VIEW COMPARISON:  08/30/15 FINDINGS: There is moderate cardiac enlargement. Previous median sternotomy and CABG procedure. Pulmonary vascular congestion. No pleural effusion or edema. IMPRESSION: 1. Cardiac  enlargement and pulmonary vascular congestion. Electronically Signed   By: Kerby Moors M.D.   On: 09/01/2015 08:33     ASSESSMENT AND PLAN:    55 year old male with status post renal transplant, diabetes and sleep apnea who presented with fevers.  1.  Fevers:Diff dx includes bacteremia, PCP GI virus, listeriosis, CMV, EBV, BK virus, rejection.  Continue Unasyn and doxycycline empirically. Continue Bactrim for PCP prophylaxis Blood cultures thus far are negative. Appreciate ID consult. If patient remains afebrile then possible discharge tomorrow. If hypoxia worsens and check chest CT and consider starting treatment dose of Bactrim for PCP.  2. Acute kidney injury: This is due to prerenal azotemia.  3. Status post renal transplant: Continue tacrolimus and mycophenolate.  4. Essential hypertension: Continue Norvasc and lisinopril 5. Acute systolic heart failure: Restart Lasix and monitor creatinine  6. Diabetes: Continue Lantus and sliding scale insulin.  7. Depression: Continue Wellbutrin. Management plans discussed with the patient and he is in agreement.  CODE STATUS: FULL  TOTAL TIME TAKING CARE OF THIS PATIENT: 30 minutes.     POSSIBLE D/C tomorrow, DEPENDING ON CLINICAL CONDITION.   Assia Meanor M.D on 09/02/2015 at 12:09 PM  Between 7am to 6pm - Pager - [phone] After 6pm go to [email or web address] - password [email or web address] [facility]  Office  [phone]  CC: Primary care physician; [name], MD  Note: This dictation was prepared with Dragon dictation along with smaller phrase technology. Any transcriptional errors that result from this process are unintentional.

## 2015-09-03 LAB — BASIC METABOLIC PANEL
Anion gap: 6 (ref 5–15)
BUN: 20 mg/dL (ref 6–20)
CALCIUM: 8.3 mg/dL — AB (ref 8.9–10.3)
CO2: 23 mmol/L (ref 22–32)
CREATININE: 1.35 mg/dL — AB (ref 0.61–1.24)
Chloride: 108 mmol/L (ref 101–111)
GFR calc Af Amer: >60 mL/min (ref >60)
GFR, EST NON AFRICAN AMERICAN: 58 mL/min — AB (ref >60)
GLUCOSE: 115 mg/dL — AB (ref 65–99)
Potassium: 3.3 mmol/L — ABNORMAL LOW (ref 3.5–5.1)
Sodium: 137 mmol/L (ref 135–145)

## 2015-09-03 LAB — GLUCOSE, CAPILLARY
GLUCOSE-CAPILLARY: 145 mg/dL — AB (ref 65–99)
GLUCOSE-CAPILLARY: 142 mg/dL — AB (ref 65–99)
Glucose-Capillary: 155 mg/dL — ABNORMAL HIGH (ref 65–99)
Glucose-Capillary: 105 mg/dL — ABNORMAL HIGH (ref 65–99)
Glucose-Capillary: 117 mg/dL — ABNORMAL HIGH (ref 65–99)

## 2015-09-03 MED ORDER — VALGANCICLOVIR HCL 450 MG PO TABS
900.0000 mg | ORAL_TABLET | Freq: Two times a day (BID) | ORAL | Status: DC
Start: 2015-09-03 — End: 2015-09-04
  Administered 2015-09-03 – 2015-09-04 (×3): 900 mg via ORAL
  Filled 2015-09-03 (×4): qty 2

## 2015-09-03 MED ORDER — FUROSEMIDE 40 MG PO TABS
40.0000 mg | ORAL_TABLET | Freq: Two times a day (BID) | ORAL | Status: DC
  Administered 2015-09-03: 40 mg via ORAL
  Filled 2015-09-03: qty 1

## 2015-09-03 MED ORDER — MYCOPHENOLATE SODIUM 180 MG PO TBEC
360.0000 mg | DELAYED_RELEASE_TABLET | Freq: Two times a day (BID) | ORAL | Status: DC
  Administered 2015-09-03 – 2015-09-04 (×2): 360 mg via ORAL
  Filled 2015-09-03 (×2): qty 2

## 2015-09-03 MED ORDER — POTASSIUM CHLORIDE CRYS ER 10 MEQ PO TBCR: 30.0000 meq | EXTENDED_RELEASE_TABLET | Freq: Every day | ORAL | Status: DC

## 2015-09-03 MED ORDER — POTASSIUM CHLORIDE CRYS ER 20 MEQ PO TBCR
30.0000 meq | EXTENDED_RELEASE_TABLET | Freq: Every day | ORAL | Status: DC
  Administered 2015-09-04: 30 meq via ORAL
  Filled 2015-09-03: qty 1

## 2015-09-03 MED ORDER — POTASSIUM CHLORIDE 20 MEQ/15ML (10%) PO SOLN
40.0000 meq | Freq: Once | ORAL | Status: AC
  Administered 2015-09-03: 40 meq via ORAL
  Filled 2015-09-03: qty 30

## 2015-09-03 MED ORDER — FUROSEMIDE 40 MG PO TABS
40.0000 mg | ORAL_TABLET | Freq: Every day | ORAL | Status: DC
  Administered 2015-09-04: 40 mg via ORAL
  Filled 2015-09-03: qty 1

## 2015-09-03 NOTE — Progress Notes (Signed)
I called [name] and spoke with [name]. Patient continues to have fevers daily. CMV PCR DNA was positive. He is recommending decreasing dose of mycophenolate and starting the valciclovir 98m mouth twice a day I also d/w case with Dr FOla Spurrand family   Time 30 minutes

## 2015-09-03 NOTE — Progress Notes (Signed)
[place] [facility] at [place] NAME: [name]    MR#:  [id number]  DATE OF BIRTH:  30-Jul-1960  SUBJECTIVE:   Patient Had fever again yesterday. Patient continues to have 2-3 loose bowel movements.  REVIEW OF SYSTEMS:    Review of Systems  Constitutional: Positive for fever. Negative for chills and malaise/fatigue.  HENT: Negative for ear discharge, ear pain, hearing loss, nosebleeds and sore throat.   Eyes: Negative for blurred vision and pain.  Respiratory: Negative for cough, hemoptysis, shortness of breath and wheezing.   Cardiovascular: Positive for leg swelling. Negative for chest pain and palpitations.  Gastrointestinal: Positive for diarrhea. Negative for nausea, vomiting, abdominal pain and blood in stool.  Genitourinary: Negative for dysuria.  Musculoskeletal: Negative for back pain.  Neurological: Negative for dizziness, tremors, speech change, focal weakness, seizures and headaches.  Endo/Heme/Allergies: Does not bruise/bleed easily.  Psychiatric/Behavioral: Negative for depression, suicidal ideas and hallucinations.    Tolerating Diet: yes      DRUG ALLERGIES:   Allergies  Allergen Reactions  . Morphine Itching    VITALS:  Blood pressure 121/62, pulse 79, temperature 98.5 F (36.9 C), temperature source Oral, resp. rate 18, height _0  (1.803 m), weight 122.335 kg (269 lb 11.2 oz), SpO2 95 %.  PHYSICAL EXAMINATION:   Physical Exam  Constitutional: He is oriented to person, place, and time and well-developed, well-nourished, and in no distress. No distress.  HENT:  Head: Normocephalic.  Eyes: No scleral icterus.  Neck: Normal range of motion. Neck supple. No JVD present. No tracheal deviation present.  Cardiovascular: Normal rate, regular rhythm and normal heart sounds.  Exam reveals no gallop and no friction rub.   No murmur heard. Pulmonary/Chest: Effort normal and breath sounds normal. No respiratory distress. He  has no wheezes. He has no rales. He exhibits no tenderness.  Abdominal: Soft. Bowel sounds are normal. He exhibits no distension and no mass. There is no tenderness. There is no rebound and no guarding.  Musculoskeletal: Normal range of motion. He exhibits edema.  Neurological: He is alert and oriented to person, place, and time.  Skin: Skin is warm. No rash noted. No erythema.  Psychiatric: Affect and judgment normal.      LABORATORY PANEL:   CBC  Recent Labs Lab 09/02/15 0528  WBC 4.6  HGB 12.5*  HCT 36.8*  PLT 82*   ------------------------------------------------------------------------------------------------------------------  Chemistries   Recent Labs Lab 08/31/15 0402  09/03/15 0437  NA 137  < > 137  K 3.2*  < > 3.3*  CL 103  < > 108  CO2 26  < > 23  GLUCOSE 126*  < > 115*  BUN 20  < > 20  CREATININE 1.26*  < > 1.35*  CALCIUM 8.4*  < > 8.3*  AST 46*  --   --   ALT 27  --   --   ALKPHOS 128*  --   --   BILITOT 1.5*  --   --   < > = values in this interval not displayed. ------------------------------------------------------------------------------------------------------------------  Cardiac Enzymes No results for input(s): TROPONINI in the last 168 hours. ------------------------------------------------------------------------------------------------------------------  RADIOLOGY:  No results found.   ASSESSMENT AND PLAN:    55 year old male with status post renal transplant, diabetes and sleep apnea who presented with fevers.  1. FeversCMV PCR was positive. I will try to reach Dr. Ola Spurr today.  Continue Unasyn and doxycycline empirically. Continue Bactrim for PCP prophylaxis Blood cultures thus  far are negative. Appreciate ID consult. If hypoxia worsens and check chest CT and consider starting treatment dose of Bactrim for PCP.  2. Acute kidney injury: This is due to prerenal azotemia. Repeat BMP in a.m. 3. Status post renal  transplant: Continue tacrolimus and mycophenolate. May try to contact renal transplant service at [facility]  4. Essential hypertension: Continue Norvasc and lisinopril 5. Acute on chronic systolic heart failure: Monitor creatinine on Lasix.  6. Diabetes: Continue Lantus and sliding scale insulin. Blood sugars control. 7. Depression: Continue Wellbutrin. Management plans discussed with the patient and he is in agreement. Discussed with patient's wife CODE STATUS: FULL  TOTAL TIME TAKING CARE OF THIS PATIENT: 22 minutes.     POSSIBLE D/C tomorrow, DEPENDING ON CLINICAL CONDITION.   Rasheida Broden M.D on 09/03/2015 at 10:54 AM  Between 7am to 6pm - Pager - [phone] After 6pm go to [email or web address] - password [email or web address] [facility]  Office  [phone]  CC: Primary care physician; [name], MD  Note: This dictation was prepared with Dragon dictation along with smaller phrase technology. Any transcriptional errors that result from this process are unintentional.

## 2015-09-04 ENCOUNTER — Other Ambulatory Visit: Payer: Self-pay

## 2015-09-04 ENCOUNTER — Telehealth: Payer: Self-pay

## 2015-09-04 LAB — BASIC METABOLIC PANEL
Anion gap: 6 (ref 5–15)
BUN: 16 mg/dL (ref 6–20)
CALCIUM: 8.1 mg/dL — AB (ref 8.9–10.3)
CO2: 23 mmol/L (ref 22–32)
CREATININE: 1.24 mg/dL (ref 0.61–1.24)
Chloride: 107 mmol/L (ref 101–111)
Glucose, Bld: 106 mg/dL — ABNORMAL HIGH (ref 65–99)
Potassium: 3.4 mmol/L — ABNORMAL LOW (ref 3.5–5.1)
SODIUM: 136 mmol/L (ref 135–145)

## 2015-09-04 LAB — CULTURE, BLOOD (ROUTINE X 2)
CULTURE: NO GROWTH
Culture: NO GROWTH

## 2015-09-04 LAB — CBC
HCT: 35 % — ABNORMAL LOW (ref 40.0–52.0)
Hemoglobin: 11.9 g/dL — ABNORMAL LOW (ref 13.0–18.0)
MCH: 28 pg (ref 26.0–34.0)
MCHC: 34 g/dL (ref 32.0–36.0)
MCV: 82.5 fL (ref 80.0–100.0)
PLATELETS: 115 10*3/uL — AB (ref 150–440)
RBC: 4.24 MIL/uL — AB (ref 4.40–5.90)
RDW: 15.1 % — ABNORMAL HIGH (ref 11.5–14.5)
WBC: 5.7 10*3/uL (ref 3.8–10.6)

## 2015-09-04 LAB — GLUCOSE, CAPILLARY
GLUCOSE-CAPILLARY: 98 mg/dL (ref 65–99)
GLUCOSE-CAPILLARY: 132 mg/dL — AB (ref 65–99)
GLUCOSE-CAPILLARY: 122 mg/dL — AB (ref 65–99)

## 2015-09-04 MED ORDER — MYCOPHENOLATE SODIUM 360 MG PO TBEC: 360.0000 mg | DELAYED_RELEASE_TABLET | Freq: Two times a day (BID) | ORAL | Status: DC

## 2015-09-04 MED ORDER — FUROSEMIDE 10 MG/ML IJ SOLN
40.0000 mg | Freq: Once | INTRAMUSCULAR | Status: AC
  Administered 2015-09-04: 40 mg via INTRAVENOUS
  Filled 2015-09-04: qty 4

## 2015-09-04 MED ORDER — SULFAMETHOXAZOLE-TRIMETHOPRIM 800-160 MG PO TABS: 1.0000 | ORAL_TABLET | ORAL | Status: DC

## 2015-09-04 MED ORDER — VALGANCICLOVIR HCL 450 MG PO TABS: 900.0000 mg | ORAL_TABLET | Freq: Two times a day (BID) | ORAL | Status: DC

## 2015-09-04 NOTE — Progress Notes (Signed)
Patient discharged to home. Concerns addressed. IV site removed. Discharge summary given to patient. Patient awaiting spouse to pick up for discharge.

## 2015-09-04 NOTE — Progress Notes (Signed)
Patient discharged to home. Concerns addressed. Wheeled out with volunteer!

## 2015-09-04 NOTE — Care Management Important Message (Signed)
Important Message  Patient Details  Name: [name] MRN: [id number] Date of Birth: 1960-05-08   Medicare Important Message Given:  Yes    Beverly Sessions, RN 09/04/2015, 11:34 AM

## 2015-09-04 NOTE — Patient Outreach (Signed)
[facility]) Care Management  09/04/2015  HARMON BOMMARITO 09/26/1960 [id number]  Outreach to patient- saw in his notes that he had a recent admission to the hospital for "generalized body aches, chills, poor by mouth intake and nausea" .  When I reached him by phone, he was still in the hospital.  CBG were ideal.  Patient tells me he came to the hospital for "dehydration".  MD was arriving to his room while I was on the phone with him so he hung up the phone saying he'd return my call.    [name], RN, BA, [place], [place] Direct Dial:  [phone]  Fax:  [phone] E-mail: [name] [address] [place], [place]  [address]

## 2015-09-04 NOTE — Discharge Summary (Signed)
[place] [facility] [place] NAME: [name]    MR#:  [id number]  DATE OF BIRTH:  October 08, 1960  DATE OF ADMISSION:  08/30/2015 ADMITTING PHYSICIAN: Fritzi Mandes, MD  DATE OF DISCHARGE: *09/03/2105  PRIMARY CARE PHYSICIAN: [name], MD    ADMISSION DIAGNOSIS:  Hypokalemia [E87.6] Hyponatremia [E87.1] UTI (lower urinary tract infection) [N39.0] Acute renal failure, unspecified acute renal failure type ([place]) [N17.9]  DISCHARGE DIAGNOSIS:  Active Problems:   Acute renal failure (ARF) ([place])   SECONDARY DIAGNOSIS:   Past Medical History  Diagnosis Date  . Hypertension   . Hyperlipidemia     low since renal failure  . Kidney transplant status, cadaveric 2012  . Depression   . GERD (gastroesophageal reflux disease)   . Diabetes mellitus   . Gout   . Asthma   . ESRD (end stage renal disease) ([place]) 2005    Dialysis then renal transplant  . CAD (coronary artery disease) 2005    had CABG  . CHF (congestive heart failure) ([place])   . Pneumonia   . Wears glasses   . Sleep apnea     bipap with oxygen 2 l  . Atrial fibrillation ([place])   . Trigger finger   . Amnestic MCI (mild cognitive impairment with memory loss)   . Dyspnea   . Benign neoplasm of colon   . Diverticulosis   . Chronic venous stasis dermatitis of both lower extremities   . Purpura ([place])   . Erectile dysfunction   . Obesity   . Atrial fibrillation ([place]) 2016    HOSPITAL COURSE:   55 year old male with status post renal transplant, diabetes and sleep apnea who presented with fevers.  1. Fevers: Blood and urine culture were negative. CMV PCR was positive. Qualitative is pending He was empirically started on broad-spectrum antibiotics including Unasyn and doxycycline. He was also started on Bactrim for PCP prophylaxis as per ID consult. Patient has not had fevers for 24 hours. I spoke with Dr. Roxy Horseman at [facility] who recommended making adjustments to his  transplant medications and starting the patient onVALCYTE 900 mg twice a day.  2. Acute kidney injury: This was due to prerenal azotemia. Creatinine has normalized. Patient needs close follow up for his kidney function while on nephrotoxic agents including his renal transplant medications as well as Lasix.   3. Status post renal transplant: Continue tacrolimus and mycophenolate (dose was decreased as per Dr. Roxy Horseman). 4. Essential hypertension: Continue Norvasc and lisinopril 5. Acute on chronic systolic heart failure: Patient has lower extremity edema and weight gain. He was restarted on oral Lasix however did need IV Lasix. He will need follow-up for heart failure as well as a repeat BMP in 2-3 days.  6. Diabetes: Continue Lantus.Blood sugars were adequately controlled while in the hospital.  7. Depression: Continue Wellbutrin  8. Hypokalemia: This was repleted. Patient is on potassium supplement daily.   DISCHARGE CONDITIONS AND DIET:   Stable for discharge home on a heart healthy/diabetic diet  CONSULTS OBTAINED:  Treatment Team:  Leonel Ramsay, MD  DRUG ALLERGIES:   Allergies  Allergen Reactions  . Morphine Itching    DISCHARGE MEDICATIONS:   Current Discharge Medication List    START taking these medications   Details  sulfamethoxazole-trimethoprim (BACTRIM DS,SEPTRA DS) 800-160 MG tablet Take 1 tablet by mouth 3 (three) times a week. Qty: 30 tablet, Refills: 0    valGANciclovir (VALCYTE) 450 MG tablet Take 2 tablets (900 mg  total) by mouth 2 (two) times daily. Qty: 30 tablet, Refills: 0      CONTINUE these medications which have CHANGED   Details  mycophenolate (MYFORTIC) 360 MG TBEC EC tablet Take 1 tablet (360 mg total) by mouth 2 (two) times daily. Qty: 60 tablet, Refills: 0      CONTINUE these medications which have NOT CHANGED   Details  amLODipine (NORVASC) 5 MG tablet Take 1 tablet (5 mg total) by mouth daily. Qty: 90 tablet, Refills: 3     apixaban (ELIQUIS) 5 MG TABS tablet Take 5 mg by mouth 2 (two) times daily.    buPROPion (WELLBUTRIN) 75 MG tablet Take 1 tablet (75 mg total) by mouth 2 (two) times daily. Qty: 180 tablet, Refills: 3    furosemide (LASIX) 40 MG tablet Take 40 mg by mouth 2 (two) times daily.     HYDROcodone-acetaminophen (NORCO/VICODIN) 5-325 MG tablet Take 1-2 tablets by mouth every 6 (six) hours as needed for moderate pain.    insulin aspart (NOVOLOG FLEXPEN) 100 UNIT/ML FlexPen Inject 3-5 Units into the skin 3 (three) times daily with meals as needed for high blood sugar. Pt uses as needed per sliding scale.    insulin glargine (LANTUS) 100 unit/mL SOPN Inject 20 Units into the skin at bedtime.    lisinopril (PRINIVIL,ZESTRIL) 40 MG tablet Take 40 mg by mouth daily.    metolazone (ZAROXOLYN) 2.5 MG tablet Take 2.5 mg by mouth daily as needed (for edema).     mometasone (NASONEX) 50 MCG/ACT nasal spray Place 2 sprays into both nostrils at bedtime.    Multiple Vitamin (MULTIVITAMIN WITH MINERALS) TABS tablet Take 1 tablet by mouth daily.    omeprazole (PRILOSEC) 20 MG capsule Take 20 mg by mouth daily.    potassium chloride (K-DUR,KLOR-CON) 10 MEQ tablet Take 30 mEq by mouth daily.    tacrolimus (PROGRAF) 0.5 MG capsule Take 1 mg by mouth 2 (two) times daily.       STOP taking these medications     amoxicillin-clavulanate (AUGMENTIN) 875-125 MG tablet      levofloxacin (LEVAQUIN) 500 MG tablet               Today   CHIEF COMPLAINT:   Patient doing well this morning although does have increased fluid retention. Patient really wants to go home for centigrade graduation.   VITAL SIGNS:  Blood pressure 144/68, pulse 68, temperature 99.2 F (37.3 C), temperature source Oral, resp. rate 19, height _0  (1.803 m), weight 122.335 kg (269 lb 11.2 oz), SpO2 95 %.   REVIEW OF SYSTEMS:  Review of Systems  Constitutional: Negative for fever, chills and malaise/fatigue.  HENT:  Negative for ear discharge, ear pain, hearing loss, nosebleeds and sore throat.   Eyes: Negative for blurred vision and pain.  Respiratory: Negative for cough, hemoptysis, shortness of breath and wheezing.   Cardiovascular: Positive for leg swelling. Negative for chest pain and palpitations.  Gastrointestinal: Negative for nausea, vomiting, abdominal pain, diarrhea and blood in stool.  Genitourinary: Negative for dysuria.  Musculoskeletal: Negative for back pain.  Neurological: Negative for dizziness, tremors, speech change, focal weakness, seizures and headaches.  Endo/Heme/Allergies: Does not bruise/bleed easily.  Psychiatric/Behavioral: Negative for depression, suicidal ideas and hallucinations.     PHYSICAL EXAMINATION:  GENERAL:  55 y.o.-year-old patient lying in the bed with no acute distress.  NECK:  Supple, no jugular venous distention. No thyroid enlargement, no tenderness.  LUNGS: Normal breath sounds bilaterally, no wheezing, rales,rhonchi  No use of accessory muscles of respiration.  CARDIOVASCULAR: S1, S2 normal. No murmurs, rubs, or gallops.  ABDOMEN: Soft, non-tender, non-distended. Bowel sounds present. No organomegaly or mass.  EXTREMITIES: +2+ lower extremity edema no  cyanosis, or clubbing.  PSYCHIATRIC: The patient is alert and oriented x 3.  SKIN: No obvious rash, lesion, or ulcer.   DATA REVIEW:   CBC  Recent Labs Lab 09/04/15 0440  WBC 5.7  HGB 11.9*  HCT 35.0*  PLT 115*    Chemistries   Recent Labs Lab 08/31/15 0402  09/04/15 0440  NA 137  < > 136  K 3.2*  < > 3.4*  CL 103  < > 107  CO2 26  < > 23  GLUCOSE 126*  < > 106*  BUN 20  < > 16  CREATININE 1.26*  < > 1.24  CALCIUM 8.4*  < > 8.1*  AST 46*  --   --   ALT 27  --   --   ALKPHOS 128*  --   --   BILITOT 1.5*  --   --   < > = values in this interval not displayed.  Cardiac Enzymes No results for input(s): TROPONINI in the last 168 hours.  Microbiology Results   _0 @  RADIOLOGY:  No results found.    Management plans discussed with the patient and he is in agreement. Stable for discharge home D/w wife at bedside Patient should follow up with Dr Roxy Horseman Thursday or Friday for BMP   CODE STATUS:     Code Status Orders        Start     Ordered   08/30/15 2123  Full code   Continuous     08/30/15 2122    Code Status History    Date Active Date Inactive Code Status Order ID Comments User Context   10/13/2014  7:35 AM 10/14/2014  4:47 PM Full Code [id number]  Juluis Mire, MD Inpatient      TOTAL TIME TAKING CARE OF THIS PATIENT: 38 minutes.    Note: This dictation was prepared with Dragon dictation along with smaller phrase technology. Any transcriptional errors that result from this process are unintentional.  Sayra Frisby M.D on 09/04/2015 at 11:40 AM  Between 7am to 6pm - Pager - [phone] After 6pm go to [email or web address] - password [email or web address] [facility]  Office  [phone]  CC: Primary care physician; [name], MD

## 2015-09-04 NOTE — Telephone Encounter (Signed)
Spoke to pt. He is setting up with his transplant physicians as we were speaking. I advised him we did not need to see him if he was seeing the transplant physicians. Advised him to let us know if he needed anything

## 2015-09-05 LAB — CMV DNA BY PCR, QUALITATIVE: CMV DNA QL PCR: POSITIVE — AB

## 2015-09-06 DIAGNOSIS — Z9884 Bariatric surgery status: Secondary | ICD-10-CM | POA: Diagnosis not present

## 2015-09-06 DIAGNOSIS — Z940 Kidney transplant status: Secondary | ICD-10-CM | POA: Diagnosis not present

## 2015-09-06 DIAGNOSIS — I2510 Atherosclerotic heart disease of native coronary artery without angina pectoris: Secondary | ICD-10-CM | POA: Diagnosis not present

## 2015-09-06 DIAGNOSIS — Z4822 Encounter for aftercare following kidney transplant: Secondary | ICD-10-CM | POA: Diagnosis not present

## 2015-09-06 DIAGNOSIS — R809 Proteinuria, unspecified: Secondary | ICD-10-CM | POA: Diagnosis not present

## 2015-09-06 DIAGNOSIS — B259 Cytomegaloviral disease, unspecified: Secondary | ICD-10-CM | POA: Diagnosis not present

## 2015-09-06 DIAGNOSIS — D899 Disorder involving the immune mechanism, unspecified: Secondary | ICD-10-CM | POA: Diagnosis not present

## 2015-09-06 DIAGNOSIS — Z79899 Other long term (current) drug therapy: Secondary | ICD-10-CM | POA: Diagnosis not present

## 2015-09-06 DIAGNOSIS — G4733 Obstructive sleep apnea (adult) (pediatric): Secondary | ICD-10-CM | POA: Diagnosis not present

## 2015-09-06 DIAGNOSIS — N186 End stage renal disease: Secondary | ICD-10-CM | POA: Diagnosis not present

## 2015-09-06 DIAGNOSIS — I120 Hypertensive chronic kidney disease with stage 5 chronic kidney disease or end stage renal disease: Secondary | ICD-10-CM | POA: Diagnosis not present

## 2015-09-06 DIAGNOSIS — Z7901 Long term (current) use of anticoagulants: Secondary | ICD-10-CM | POA: Diagnosis not present

## 2015-09-06 DIAGNOSIS — Z794 Long term (current) use of insulin: Secondary | ICD-10-CM | POA: Diagnosis not present

## 2015-09-06 DIAGNOSIS — D8989 Other specified disorders involving the immune mechanism, not elsewhere classified: Secondary | ICD-10-CM | POA: Diagnosis not present

## 2015-09-06 DIAGNOSIS — E872 Acidosis: Secondary | ICD-10-CM | POA: Diagnosis not present

## 2015-09-06 DIAGNOSIS — I4891 Unspecified atrial fibrillation: Secondary | ICD-10-CM | POA: Diagnosis not present

## 2015-09-06 DIAGNOSIS — R197 Diarrhea, unspecified: Secondary | ICD-10-CM | POA: Diagnosis not present

## 2015-09-06 DIAGNOSIS — E8770 Fluid overload, unspecified: Secondary | ICD-10-CM | POA: Diagnosis not present

## 2015-09-06 DIAGNOSIS — E1122 Type 2 diabetes mellitus with diabetic chronic kidney disease: Secondary | ICD-10-CM | POA: Diagnosis not present

## 2015-09-06 DIAGNOSIS — E118 Type 2 diabetes mellitus with unspecified complications: Secondary | ICD-10-CM | POA: Diagnosis not present

## 2015-09-06 DIAGNOSIS — I10 Essential (primary) hypertension: Secondary | ICD-10-CM | POA: Diagnosis not present

## 2015-09-06 DIAGNOSIS — Z885 Allergy status to narcotic agent status: Secondary | ICD-10-CM | POA: Diagnosis not present

## 2015-09-06 LAB — MISC LABCORP TEST (SEND OUT): Labcorp test code: 138962

## 2015-09-07 DIAGNOSIS — B259 Cytomegaloviral disease, unspecified: Secondary | ICD-10-CM | POA: Insufficient documentation

## 2015-09-08 ENCOUNTER — Ambulatory Visit: Payer: 59 | Admitting: Internal Medicine

## 2015-09-12 DIAGNOSIS — R809 Proteinuria, unspecified: Secondary | ICD-10-CM | POA: Diagnosis not present

## 2015-09-12 DIAGNOSIS — B259 Cytomegaloviral disease, unspecified: Secondary | ICD-10-CM | POA: Diagnosis not present

## 2015-09-12 DIAGNOSIS — I10 Essential (primary) hypertension: Secondary | ICD-10-CM | POA: Diagnosis not present

## 2015-09-12 DIAGNOSIS — B349 Viral infection, unspecified: Secondary | ICD-10-CM | POA: Diagnosis not present

## 2015-09-12 DIAGNOSIS — Z79899 Other long term (current) drug therapy: Secondary | ICD-10-CM | POA: Diagnosis not present

## 2015-09-12 DIAGNOSIS — Z4822 Encounter for aftercare following kidney transplant: Secondary | ICD-10-CM | POA: Diagnosis not present

## 2015-09-12 DIAGNOSIS — E119 Type 2 diabetes mellitus without complications: Secondary | ICD-10-CM | POA: Diagnosis not present

## 2015-09-12 DIAGNOSIS — Z940 Kidney transplant status: Secondary | ICD-10-CM | POA: Diagnosis not present

## 2015-09-12 DIAGNOSIS — R42 Dizziness and giddiness: Secondary | ICD-10-CM | POA: Diagnosis not present

## 2015-09-12 DIAGNOSIS — Z9884 Bariatric surgery status: Secondary | ICD-10-CM | POA: Diagnosis not present

## 2015-09-15 ENCOUNTER — Other Ambulatory Visit: Payer: Self-pay | Admitting: Cardiovascular Disease

## 2015-09-21 DIAGNOSIS — B259 Cytomegaloviral disease, unspecified: Secondary | ICD-10-CM | POA: Diagnosis not present

## 2015-09-21 DIAGNOSIS — Z940 Kidney transplant status: Secondary | ICD-10-CM | POA: Diagnosis not present

## 2015-09-21 DIAGNOSIS — Z4822 Encounter for aftercare following kidney transplant: Secondary | ICD-10-CM | POA: Diagnosis not present

## 2015-09-21 DIAGNOSIS — I10 Essential (primary) hypertension: Secondary | ICD-10-CM | POA: Diagnosis not present

## 2015-09-21 DIAGNOSIS — G4733 Obstructive sleep apnea (adult) (pediatric): Secondary | ICD-10-CM | POA: Diagnosis not present

## 2015-09-21 DIAGNOSIS — R809 Proteinuria, unspecified: Secondary | ICD-10-CM | POA: Diagnosis not present

## 2015-09-21 DIAGNOSIS — Z79899 Other long term (current) drug therapy: Secondary | ICD-10-CM | POA: Diagnosis not present

## 2015-09-21 DIAGNOSIS — D8989 Other specified disorders involving the immune mechanism, not elsewhere classified: Secondary | ICD-10-CM | POA: Diagnosis not present

## 2015-09-21 DIAGNOSIS — N179 Acute kidney failure, unspecified: Secondary | ICD-10-CM | POA: Diagnosis not present

## 2015-09-21 DIAGNOSIS — E872 Acidosis: Secondary | ICD-10-CM | POA: Diagnosis not present

## 2015-09-21 DIAGNOSIS — Z794 Long term (current) use of insulin: Secondary | ICD-10-CM | POA: Diagnosis not present

## 2015-09-21 DIAGNOSIS — N058 Unspecified nephritic syndrome with other morphologic changes: Secondary | ICD-10-CM | POA: Diagnosis not present

## 2015-09-21 DIAGNOSIS — I4891 Unspecified atrial fibrillation: Secondary | ICD-10-CM | POA: Diagnosis not present

## 2015-09-21 DIAGNOSIS — E876 Hypokalemia: Secondary | ICD-10-CM | POA: Diagnosis not present

## 2015-09-21 DIAGNOSIS — E119 Type 2 diabetes mellitus without complications: Secondary | ICD-10-CM | POA: Diagnosis not present

## 2015-09-21 DIAGNOSIS — Z7901 Long term (current) use of anticoagulants: Secondary | ICD-10-CM | POA: Diagnosis not present

## 2015-09-25 DIAGNOSIS — E662 Morbid (severe) obesity with alveolar hypoventilation: Secondary | ICD-10-CM | POA: Diagnosis not present

## 2015-09-25 DIAGNOSIS — G4733 Obstructive sleep apnea (adult) (pediatric): Secondary | ICD-10-CM | POA: Diagnosis not present

## 2015-10-04 ENCOUNTER — Other Ambulatory Visit: Payer: Self-pay

## 2015-10-04 NOTE — Patient Outreach (Signed)
[facility]) Care Management  10/04/2015  [name] Sep 22, 1960 [id number]   Sent an e-mail to Broly to schedule a visit with me.  He was last seen in the spring and has recently been in the hospital.     [name], RN, BA, [place], [place]:  [phone]  Fax:  [phone] E-mail: [name] [address] [place],   [address]

## 2015-10-06 DIAGNOSIS — Z885 Allergy status to narcotic agent status: Secondary | ICD-10-CM | POA: Diagnosis not present

## 2015-10-06 DIAGNOSIS — D8989 Other specified disorders involving the immune mechanism, not elsewhere classified: Secondary | ICD-10-CM | POA: Diagnosis not present

## 2015-10-06 DIAGNOSIS — N269 Renal sclerosis, unspecified: Secondary | ICD-10-CM | POA: Diagnosis not present

## 2015-10-06 DIAGNOSIS — Z940 Kidney transplant status: Secondary | ICD-10-CM | POA: Diagnosis not present

## 2015-10-06 DIAGNOSIS — R600 Localized edema: Secondary | ICD-10-CM | POA: Diagnosis not present

## 2015-10-06 DIAGNOSIS — D899 Disorder involving the immune mechanism, unspecified: Secondary | ICD-10-CM | POA: Diagnosis not present

## 2015-10-06 DIAGNOSIS — I120 Hypertensive chronic kidney disease with stage 5 chronic kidney disease or end stage renal disease: Secondary | ICD-10-CM | POA: Diagnosis not present

## 2015-10-06 DIAGNOSIS — I4891 Unspecified atrial fibrillation: Secondary | ICD-10-CM | POA: Diagnosis not present

## 2015-10-06 DIAGNOSIS — E876 Hypokalemia: Secondary | ICD-10-CM | POA: Diagnosis not present

## 2015-10-06 DIAGNOSIS — I2510 Atherosclerotic heart disease of native coronary artery without angina pectoris: Secondary | ICD-10-CM | POA: Diagnosis not present

## 2015-10-06 DIAGNOSIS — N179 Acute kidney failure, unspecified: Secondary | ICD-10-CM | POA: Diagnosis not present

## 2015-10-06 DIAGNOSIS — Z9884 Bariatric surgery status: Secondary | ICD-10-CM | POA: Diagnosis not present

## 2015-10-06 DIAGNOSIS — G4733 Obstructive sleep apnea (adult) (pediatric): Secondary | ICD-10-CM | POA: Diagnosis not present

## 2015-10-06 DIAGNOSIS — R609 Edema, unspecified: Secondary | ICD-10-CM | POA: Diagnosis not present

## 2015-10-06 DIAGNOSIS — Z794 Long term (current) use of insulin: Secondary | ICD-10-CM | POA: Diagnosis not present

## 2015-10-06 DIAGNOSIS — E119 Type 2 diabetes mellitus without complications: Secondary | ICD-10-CM | POA: Diagnosis not present

## 2015-10-06 DIAGNOSIS — E873 Alkalosis: Secondary | ICD-10-CM | POA: Diagnosis not present

## 2015-10-06 DIAGNOSIS — E872 Acidosis: Secondary | ICD-10-CM | POA: Diagnosis not present

## 2015-10-06 DIAGNOSIS — Z79899 Other long term (current) drug therapy: Secondary | ICD-10-CM | POA: Diagnosis not present

## 2015-10-06 DIAGNOSIS — E1122 Type 2 diabetes mellitus with diabetic chronic kidney disease: Secondary | ICD-10-CM | POA: Diagnosis not present

## 2015-10-06 DIAGNOSIS — N186 End stage renal disease: Secondary | ICD-10-CM | POA: Diagnosis not present

## 2015-10-06 DIAGNOSIS — R809 Proteinuria, unspecified: Secondary | ICD-10-CM | POA: Diagnosis not present

## 2015-10-06 DIAGNOSIS — Z4822 Encounter for aftercare following kidney transplant: Secondary | ICD-10-CM | POA: Diagnosis not present

## 2015-10-06 DIAGNOSIS — B259 Cytomegaloviral disease, unspecified: Secondary | ICD-10-CM | POA: Diagnosis not present

## 2015-10-09 ENCOUNTER — Encounter: Payer: Self-pay | Admitting: Internal Medicine

## 2015-10-09 ENCOUNTER — Ambulatory Visit (INDEPENDENT_AMBULATORY_CARE_PROVIDER_SITE_OTHER): Payer: 59 | Admitting: Internal Medicine

## 2015-10-09 VITALS — BP 140/80 | HR 68 | Temp 98.2°F | Wt 271.0 lb

## 2015-10-09 DIAGNOSIS — L98492 Non-pressure chronic ulcer of skin of other sites with fat layer exposed: Secondary | ICD-10-CM

## 2015-10-09 DIAGNOSIS — I120 Hypertensive chronic kidney disease with stage 5 chronic kidney disease or end stage renal disease: Secondary | ICD-10-CM | POA: Diagnosis not present

## 2015-10-09 DIAGNOSIS — D899 Disorder involving the immune mechanism, unspecified: Secondary | ICD-10-CM | POA: Diagnosis not present

## 2015-10-09 DIAGNOSIS — R809 Proteinuria, unspecified: Secondary | ICD-10-CM | POA: Diagnosis not present

## 2015-10-09 DIAGNOSIS — I2510 Atherosclerotic heart disease of native coronary artery without angina pectoris: Secondary | ICD-10-CM | POA: Diagnosis not present

## 2015-10-09 DIAGNOSIS — N186 End stage renal disease: Secondary | ICD-10-CM | POA: Diagnosis not present

## 2015-10-09 DIAGNOSIS — Z4822 Encounter for aftercare following kidney transplant: Secondary | ICD-10-CM | POA: Diagnosis not present

## 2015-10-09 DIAGNOSIS — E1122 Type 2 diabetes mellitus with diabetic chronic kidney disease: Secondary | ICD-10-CM | POA: Diagnosis not present

## 2015-10-09 DIAGNOSIS — N179 Acute kidney failure, unspecified: Secondary | ICD-10-CM | POA: Diagnosis not present

## 2015-10-09 DIAGNOSIS — N269 Renal sclerosis, unspecified: Secondary | ICD-10-CM | POA: Diagnosis not present

## 2015-10-09 MED ORDER — CEPHALEXIN 500 MG PO CAPS: 500.0000 mg | ORAL_CAPSULE | Freq: Three times a day (TID) | ORAL | Status: DC

## 2015-10-09 MED ORDER — SILVER SULFADIAZINE 1 % EX CREA: 1.0000 "application " | TOPICAL_CREAM | Freq: Every day | CUTANEOUS | Status: DC

## 2015-10-09 NOTE — Progress Notes (Signed)
Pre visit review using our clinic review tool, if applicable. No additional management support is needed unless otherwise documented below in the visit note.

## 2015-10-09 NOTE — Progress Notes (Signed)
Subjective:    Patient ID: [name], male    DOB: 1960-05-09, 55 y.o.   MRN: [id number]  HPI Here to have left wound checked  Injured left calf on screw used to hold umbrella in place--last night Cleaned with peroxide Covered with neosporin Has tried ibuprofen--- pain is terrible No clear discharge--didn't check the bandage  Current Outpatient Prescriptions on File Prior to Visit  Medication Sig Dispense Refill  . amLODipine (NORVASC) 5 MG tablet Take 1 tablet (5 mg total) by mouth daily. 90 tablet 3  . apixaban (ELIQUIS) 5 MG TABS tablet Take 5 mg by mouth 2 (two) times daily.    Marland Kitchen buPROPion (WELLBUTRIN) 75 MG tablet Take 1 tablet (75 mg total) by mouth 2 (two) times daily. 180 tablet 3  . ELIQUIS 5 MG TABS tablet TAKE 1 TABLET BY MOUTH 2 TIMES DAILY. 60 tablet 0  . furosemide (LASIX) 40 MG tablet Take 40 mg by mouth 2 (two) times daily.     . insulin aspart (NOVOLOG FLEXPEN) 100 UNIT/ML FlexPen Inject 3-5 Units into the skin 3 (three) times daily with meals as needed for high blood sugar. Pt uses as needed per sliding scale.    . insulin glargine (LANTUS) 100 unit/mL SOPN Inject 20 Units into the skin at bedtime.    Marland Kitchen lisinopril (PRINIVIL,ZESTRIL) 40 MG tablet Take 40 mg by mouth daily.    . metolazone (ZAROXOLYN) 2.5 MG tablet Take 2.5 mg by mouth daily as needed (for edema).     . mometasone (NASONEX) 50 MCG/ACT nasal spray Place 2 sprays into both nostrils at bedtime.    . Multiple Vitamin (MULTIVITAMIN WITH MINERALS) TABS tablet Take 1 tablet by mouth daily.    Marland Kitchen omeprazole (PRILOSEC) 20 MG capsule Take 20 mg by mouth daily.    . potassium chloride (K-DUR,KLOR-CON) 10 MEQ tablet Take 30 mEq by mouth daily.    Marland Kitchen sulfamethoxazole-trimethoprim (BACTRIM DS,SEPTRA DS) 800-160 MG tablet Take 1 tablet by mouth 3 (three) times a week. 30 tablet 0  . tacrolimus (PROGRAF) 0.5 MG capsule Take 1 mg by mouth 2 (two) times daily.     . valGANciclovir (VALCYTE) 450 MG tablet Take 2  tablets (900 mg total) by mouth 2 (two) times daily. 30 tablet 0   No current facility-administered medications on file prior to visit.    Allergies  Allergen Reactions  . Morphine Itching    Past Medical History  Diagnosis Date  . Hypertension   . Hyperlipidemia     low since renal failure  . Kidney transplant status, cadaveric 2012  . Depression   . GERD (gastroesophageal reflux disease)   . Diabetes mellitus   . Gout   . Asthma   . ESRD (end stage renal disease) ([place]) 2005    Dialysis then renal transplant  . CAD (coronary artery disease) 2005    had CABG  . CHF (congestive heart failure) ([place])   . Pneumonia   . Wears glasses   . Sleep apnea     bipap with oxygen 2 l  . Atrial fibrillation ([place])   . Trigger finger   . Amnestic MCI (mild cognitive impairment with memory loss)   . Dyspnea   . Benign neoplasm of colon   . Diverticulosis   . Chronic venous stasis dermatitis of both lower extremities   . Purpura ([place])   . Erectile dysfunction   . Obesity   . Atrial fibrillation ([place]) 2016    Past Surgical History  Procedure Laterality Date  . Kidney transplant  09/13/2010    cadaver--at [facility]  . Tympanic membrane repair  1/12    left  . Dg angio av shunt*l*      right and left upper arms  . Fasciotomy  03/03/2012    Procedure: FASCIOTOMY;  Surgeon: [name], MD;  Location: [facility];  Service: Orthopedics;  Laterality: Right;  FASCIOTOMY RIGHT SMALL FINGER  . Cardiac catheterization      [facility]  . Coronary artery bypass graft  2005    X 4  . Colonoscopy with propofol N/A 04/22/2013    Procedure: COLONOSCOPY WITH PROPOFOL;  Surgeon: [name], MD;  Location: [facility];  Service: Endoscopy;  Laterality: N/A;  . Fasciotomy Left 08/17/2013    Procedure: FASCIOTOMY LEFT RING;  Surgeon: [name], MD;  Location: [facility];  Service: Orthopedics;  Laterality: Left;  . Bariatric surgery    . Vasectomy      Family  History  Problem Relation Age of Onset  . Heart disease Father   . Kidney failure Father   . Kidney disease Father   . Diabetes Maternal Grandmother   . Breast cancer Maternal Grandmother   . Liver cancer Paternal Uncle   . Liver cancer Paternal Grandmother   . Prostate cancer Neg Hx     Social History   Social History  . Marital Status: Married    Spouse Name: N/A  . Number of Children: 2  . Years of Education: N/A   Occupational History  . 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 Smoking status: Former Smoker    Types: Cigars    Quit date: 09/02/1994  . Smokeless tobacco: Never Used     Comment: cigar- once in a while  . Alcohol Use: 0.0 - 0.6 oz/week    0-1 Standard drinks or equivalent per week     Comment: occasional- 3 in the past year  . Drug Use: No  . Sexual Activity: Not on file   Other Topics Concern  . Not on file   Social History Narrative   In school for culinary arts--- will finish 12/16      Has living will   Wife is health care POA   Would accept resuscitation attempts   Hasn't considered tube feedings   Review of Systems  No fever No N/V     Objective:   Physical Exam  Skin:  Broad open area covered partially with devitalized skin on left lower anterior calf. Total size ~4 x 10cm. Very tender but not really red or warm          Assessment & Plan:

## 2015-10-09 NOTE — Assessment & Plan Note (Addendum)
Fairly superficial but I worry about infection given the level of pain and his immunosuppression. Debrided easily with sterile pickups and scalpel. Dressed with silvadene and DSD. Discussed daily changes at home. Will start empiric cephalexin UTD on tetanus

## 2015-10-10 ENCOUNTER — Other Ambulatory Visit: Payer: Self-pay

## 2015-10-10 ENCOUNTER — Encounter: Payer: Self-pay | Admitting: Internal Medicine

## 2015-10-10 ENCOUNTER — Telehealth: Payer: Self-pay | Admitting: Internal Medicine

## 2015-10-10 ENCOUNTER — Ambulatory Visit (INDEPENDENT_AMBULATORY_CARE_PROVIDER_SITE_OTHER): Payer: 59 | Admitting: Internal Medicine

## 2015-10-10 VITALS — BP 156/66 | Ht 71.0 in | Wt 267.8 lb

## 2015-10-10 VITALS — BP 140/74 | HR 72 | Temp 98.4°F | Wt 271.0 lb

## 2015-10-10 DIAGNOSIS — S81802D Unspecified open wound, left lower leg, subsequent encounter: Secondary | ICD-10-CM | POA: Diagnosis not present

## 2015-10-10 DIAGNOSIS — Z794 Long term (current) use of insulin: Secondary | ICD-10-CM

## 2015-10-10 DIAGNOSIS — E118 Type 2 diabetes mellitus with unspecified complications: Secondary | ICD-10-CM

## 2015-10-10 MED ORDER — HYDROCODONE-ACETAMINOPHEN 5-325 MG PO TABS: 1.0000 | ORAL_TABLET | Freq: Three times a day (TID) | ORAL | Status: DC | PRN

## 2015-10-10 NOTE — Telephone Encounter (Signed)
Pt has appt with R Baity NP 10/10/15 at 2 pm

## 2015-10-10 NOTE — Telephone Encounter (Signed)
[facility]  Patient Name: [name]  DOB: 1960-06-19    Initial Comment Caller States he went to change his bandage this morning and there was a lot of discharge was told to report it if that happened, is also having a lot of pain and owuld like something sent in    Nurse Assessment  Nurse: Wynetta Emery, RN, Baker Janus Date/Time Eilene Ghazi Time): 10/10/2015 8:59:16 AM  Confirm and document reason for call. If symptomatic, describe symptoms. You must click the next button to save text entered. ---[name] was injured working on his deck Sunday left leg has open wound - seen MD yesterday and has antibiotic cream applied and dressed this am redressed and found lot of yellow discharge from the wound and is having a lot of pain taking antibiotic given at this time pain is 6.5 to 7 and having hard time sleeping and walking on leg. requesting some thing for pain fells the top of the leg is more swollen at the top of the wound than yesterday  Has the patient traveled out of the country within the last 30 days? ---No  Does the patient have any new or worsening symptoms? ---Yes  Will a triage be completed? ---Yes  Related visit to physician within the last 2 weeks? ---Yes  Does the PT have any chronic conditions? (i.e. diabetes, asthma, etc.) ---No  Is this a behavioral health or substance abuse call? ---No     Guidelines    Guideline Title Affirmed Question Affirmed Notes  Wound Infection [1] Pus or cloudy fluid draining from wound AND [2] no fever    Final Disposition User   See Physician within 24 Hours Wynetta Emery, RN, Baker Janus    Comments  NOTE Nurse schedule appt with Edythe Clarity, NP 200pm 10-10-2015 for possilble infection of leg wound no available appts with Dr. Silvio Pate   Referrals  REFERRED TO PCP OFFICE   Disagree/Comply: Comply

## 2015-10-10 NOTE — Progress Notes (Signed)
Pre visit review using our clinic review tool, if applicable. No additional management support is needed unless otherwise documented below in the visit note.

## 2015-10-10 NOTE — Patient Instructions (Signed)
Deep Skin Avulsion A deep skin avulsion is a type of open wound. It often results from a severe injury (trauma) that tears away all layers of the skin or an entire body part. The areas of the body that are most often affected by a deep skin avulsion include the face, lips, ears, nose, and fingers. A deep skin avulsion may make structures below the skin become visible. You may be able to see muscle, bone, nerves, and blood vessels. A deep skin avulsion can also damage important structures beneath the skin. These include tendons, ligaments, nerves, or blood vessels. CAUSES Injuries that often cause a deep skin avulsion include:  Being crushed.  Falling against a jagged surface.  Animal bites.  Gunshot wounds.  Severe burns.  Injuries that involve being dragged, such as bicycle or motorcycle accidents. SYMPTOMS Symptoms of a deep skin avulsion include:  Pain.  Numbness.  Swelling.  A misshapen body part.  Bleeding, which may be heavy.  Fluid leaking from the wound. DIAGNOSIS This condition may be diagnosed with a medical history and physical exam. You may also have X-rays done. TREATMENT The treatment that is chosen for a deep skin avulsion depends on how large and deep the wound is and where it is located. Treatment for all types of avulsions usually starts with:  Controlling the bleeding.  Washing out the wound with a germ-free (sterile) salt-water solution.  Removing dead tissue from the wound. A wound may be closed or left open to heal. This depends on the size and location of the wound and whether it is likely to become infected. Wounds are usually covered or closed if they expose blood vessels, nerves, bone, or cartilage.  Wounds that are small and clean may be closed with stitches (sutures).  Wounds that cannot be closed with sutures may be covered with a piece of skin (graft) or a skin flap. Skin may be taken from on or near the wound, from another part of the body,  or from a donor.  Wounds may be left open if they are hard to close or they may become infected. These wounds heal over time from the bottom up. You may also receive medicine. This may include:  Antibiotics.  A tetanus shot.  Rabies vaccine. HOME CARE INSTRUCTIONS Medicines  Take or apply over-the-counter and prescription medicines only as told by your health care provider.  If you were prescribed an antibiotic, take or apply it as told by your health care provider. Do not stop taking the antibiotic even if your condition improves.  You may get anti-itch medicine while your wound is healing. Use it only as told by your health care provider. Wound Care  There are many ways to close and cover a wound. For example, a wound can be covered with sutures, skin glue, or adhesive strips. Follow instructions from your health care provider about:  How to take care of your wound.  When and how you should change your bandage (dressing).  When you should remove your dressing.  Removing whatever was used to close your wound.  Keep the dressing dry as told by your health care provider. Do not take baths, swim, use a hot tub, or do anything that would put your wound underwater until your health care provider approves.  Clean the wound each day or as told by your health care provider.  Wash the wound with mild soap and water.  Rinse the wound with water to remove all soap.  Pat the wound  dry with a clean towel. Do not rub it.  Do not scratch or pick at the wound.  Check your wound every day for signs of infection. Watch for:  Redness, swelling, or pain.  Fluid, blood, or pus. General Instructions  Raise (elevate) the injured area above the level of your heart while you are sitting or lying down.  Keep all follow-up visits as told by your health care provider. This is important. SEEK MEDICAL CARE IF:  You received a tetanus shot and you have swelling, severe pain, redness, or  bleeding at the injection site.  You have a fever.  Your pain is not controlled with medicine.  You have increased redness, swelling, or pain at the site of your wound.  You have fluid, blood, or pus coming from your wound.  You notice a bad smell coming from your wound or your dressing.  A wound that was closed breaks open.  You notice something coming out of the wound, such as wood or glass.  You notice a change in the color of your skin near your wound.  You develop a new rash.  You need to change the dressing frequently due to fluid, blood, or pus draining from the wound. SEEK IMMEDIATE MEDICAL CARE IF:  Your pain suddenly increases and is severe.  You develop severe swelling around the wound.  You develop numbness around the wound.  You have nausea and vomiting that does not go away after 24 hours.  You feel light-headed, weak, or faint.  You develop chest pain.  You have trouble breathing.  Your wound is on your hand or foot and you cannot properly move a finger or toe.  The wound is on your hand or foot and you notice that your fingers or toes look pale or bluish.  You have a red streak going away from your wound.   This information is not intended to replace advice given to you by your health care provider. Make sure you discuss any questions you have with your health care provider.   Document Released: 05/07/2006 Document Revised: 07/26/2014 Document Reviewed: 03/16/2014 Elsevier Interactive Patient Education [employer].

## 2015-10-10 NOTE — Progress Notes (Signed)
Subjective:    Patient ID: [name], male    DOB: 10-May-1960, 55 y.o.   MRN: [id number]  HPI  Pt presents to the clinic today with complaint of increased pain and swelling in left leg since appointment with Dr. Silvio Pate yesterday for infected open wound.  2 days ago, he scraped his leg against an umbrella stand. Yesterday, he underwent debridement and was prescribed Keflex. He has taken 2 doses of antibiotics so far, and he applied the Silver Sulfadiazene and changed the bandage this morning.  He noticed the bandage had yellow discharge and dried blood on it this morning, while the discharge was clear yesterday.  He reports the pain was a 4.5-5/10 yesterday and is now at a 6.5-7/10.  He is unable to describe the character of the pain, but admits the pain is worse when walking and putting weight on the leg.  He reports chills and inability to sleep secondary to pain last night, but denies fever, nausea, or vomiting.  He has tried elevating the leg and Ibuprofen without relief.      Review of Systems   Past Medical History  Diagnosis Date  . Hypertension   . Hyperlipidemia     low since renal failure  . Kidney transplant status, cadaveric 2012  . Depression   . GERD (gastroesophageal reflux disease)   . Diabetes mellitus   . Gout   . Asthma   . ESRD (end stage renal disease) ([place]) 2005    Dialysis then renal transplant  . CAD (coronary artery disease) 2005    had CABG  . CHF (congestive heart failure) ([place])   . Pneumonia   . Wears glasses   . Sleep apnea     bipap with oxygen 2 l  . Atrial fibrillation ([place])   . Trigger finger   . Amnestic MCI (mild cognitive impairment with memory loss)   . Dyspnea   . Benign neoplasm of colon   . Diverticulosis   . Chronic venous stasis dermatitis of both lower extremities   . Purpura ([name])   . Erectile dysfunction   . Obesity   . Atrial fibrillation ([place]) 2016    Current Outpatient Prescriptions  Medication Sig Dispense Refill  .  amLODipine (NORVASC) 5 MG tablet Take 1 tablet (5 mg total) by mouth daily. 90 tablet 3  . apixaban (ELIQUIS) 5 MG TABS tablet Take 5 mg by mouth 2 (two) times daily.    Marland Kitchen buPROPion (WELLBUTRIN) 75 MG tablet Take 1 tablet (75 mg total) by mouth 2 (two) times daily. 180 tablet 3  . cephALEXin (KEFLEX) 500 MG capsule Take 1 capsule (500 mg total) by mouth 3 (three) times daily. 21 capsule 0  . ELIQUIS 5 MG TABS tablet TAKE 1 TABLET BY MOUTH 2 TIMES DAILY. 60 tablet 0  . furosemide (LASIX) 40 MG tablet Take 40 mg by mouth 2 (two) times daily. Every other day    . insulin aspart (NOVOLOG FLEXPEN) 100 UNIT/ML FlexPen Inject 3-5 Units into the skin 3 (three) times daily with meals as needed for high blood sugar. Pt uses as needed per sliding scale.    . insulin glargine (LANTUS) 100 unit/mL SOPN Inject 20 Units into the skin at bedtime.    Marland Kitchen lisinopril (PRINIVIL,ZESTRIL) 40 MG tablet Take 40 mg by mouth daily.    . metolazone (ZAROXOLYN) 2.5 MG tablet Take 2.5 mg by mouth daily as needed (for edema).     . mometasone (NASONEX) 50 MCG/ACT nasal  spray Place 2 sprays into both nostrils at bedtime.    . Multiple Vitamin (MULTIVITAMIN WITH MINERALS) TABS tablet Take 1 tablet by mouth daily.    Marland Kitchen omeprazole (PRILOSEC) 20 MG capsule Take 20 mg by mouth daily.    . potassium chloride (K-DUR,KLOR-CON) 10 MEQ tablet Take 30 mEq by mouth daily.    . silver sulfADIAZINE (SILVADENE) 1 % cream Apply 1 application topically daily. 50 g 1  . sulfamethoxazole-trimethoprim (BACTRIM DS,SEPTRA DS) 800-160 MG tablet Take 1 tablet by mouth 3 (three) times a week. 30 tablet 0  . tacrolimus (PROGRAF) 0.5 MG capsule Take 1 mg by mouth 2 (two) times daily. 1 mg qam, 0.5 mg qpm    . valGANciclovir (VALCYTE) 450 MG tablet Take 2 tablets (900 mg total) by mouth 2 (two) times daily. 30 tablet 0  . HYDROcodone-acetaminophen (NORCO/VICODIN) 5-325 MG tablet Take 1 tablet by mouth every 8 (eight) hours as needed for moderate pain. 30  tablet 0   No current facility-administered medications for this visit.    Allergies  Allergen Reactions  . Morphine Itching    Family History  Problem Relation Age of Onset  . Heart disease Father   . Kidney failure Father   . Kidney disease Father   . Diabetes Maternal Grandmother   . Breast cancer Maternal Grandmother   . Liver cancer Paternal Uncle   . Liver cancer Paternal Grandmother   . Prostate cancer Neg Hx     Social History   Social History  . Marital Status: Married    Spouse Name: N/A  . Number of Children: 2  . Years of Education: N/A   Occupational History  . 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 Smoking status: Former Smoker    Types: Cigars    Quit date: 09/02/1994  . Smokeless tobacco: Never Used     Comment: cigar- once in a while  . Alcohol Use: 0.0 - 0.6 oz/week    0 Glasses of wine, 0-1 Standard drinks or equivalent per week     Comment: occasional- 3 in the past year  . Drug Use: No  . Sexual Activity: Not on file   Other Topics Concern  . Not on file   Social History Narrative   In school for culinary arts--- will finish 12/16      Has living will   Wife is health care POA   Would accept resuscitation attempts   Hasn't considered tube feedings     Const: Admits to chills. Denies fever. GI: Denies nausea and vomiting. Ext: Admits to increased pain and swelling in left leg.  No other specific complaints in a complete review of systems (except as listed in HPI above).      Objective:   Physical Exam  BP 140/74 mmHg  Pulse 72  Temp(Src) 98.4 F (36.9 C) (Oral)  Wt 271 lb (122.925 kg)  SpO2 97%   General: Appears in pain secondary to leg injury. Skin: 10 cm lesion left anterior leg, tender to palpation laterally and medially to wound, swelling proximally.   Pulm: Clear to auscultation bilaterally.  No wheezes, rales, or rhonchi. CV: Tachycardic.  No murmurs, rubs, or  gallops.       Assessment & Plan:   Open wound, left lower extremity:  Likely will not heal well due to DM 2 and vascular changes Rocephin 1g administered Continue Keflex as prescribed Continue Silver Sulfadiazine as  prescribed RX for Norco 5-325 mg for pain Follow-up with Dr. Silvio Pate on Friday  Return precautions given

## 2015-10-10 NOTE — Patient Outreach (Signed)
[facility]) Care Management  [place]  10/10/2015   [name] 01/23/1961 [id number]  Subjective: [name] was in for his regularly scheduled [facility] visit- he report his last A1C was 6.7%, down from 10.2% in April 2017.  He has been seeing his transplant MD q2weeks because he had CMV.  He walks into todays appointment, limping with a wrap on his lower left leg- he reports that he hit his leg on an outdoor umbrella stand and sheared the skin off a part of his leg.  He saw Dr. Silvio Pate yesterday and will see him again today.  [name] changed the dressing today and he said there was a fair amount of yellow drainage on the bandaid.  His toes are the same color as his other foot but his ankle and foot are swollen and warm to touch. He complains of great pain. Reports no hypoglycemia.  Fasting CBG 101-169m/dl and post meal blood sugars 122-2425mdl.  He tells me he had some bleeding in the right eye after his [place] and has seen the opthalmologic 2 x since June, 2017.   Objective:  Filed Vitals:   10/10/15 1049  BP: 156/66  Height: 1.803 m (_0 )  Weight: 267 lb 12.8 oz (121.473 kg)     Encounter Medications:  Outpatient Encounter Prescriptions as of 10/10/2015  Medication Sig  . amLODipine (NORVASC) 5 MG tablet Take 1 tablet (5 mg total) by mouth daily.  . Marland Kitchenpixaban (ELIQUIS) 5 MG TABS tablet Take 5 mg by mouth 2 (two) times daily.  . Marland KitchenuPROPion (WELLBUTRIN) 75 MG tablet Take 1 tablet (75 mg total) by mouth 2 (two) times daily.  . cephALEXin (KEFLEX) 500 MG capsule Take 1 capsule (500 mg total) by mouth 3 (three) times daily.  . Marland KitchenLIQUIS 5 MG TABS tablet TAKE 1 TABLET BY MOUTH 2 TIMES DAILY.  . furosemide (LASIX) 40 MG tablet Take 40 mg by mouth 2 (two) times daily. Every other day  . insulin aspart (NOVOLOG FLEXPEN) 100 UNIT/ML FlexPen Inject 3-5 Units into the skin 3 (three) times daily with meals as needed for high blood sugar. Pt uses as  needed per sliding scale.  . insulin glargine (LANTUS) 100 unit/mL SOPN Inject 20 Units into the skin at bedtime.  . Marland Kitchenisinopril (PRINIVIL,ZESTRIL) 40 MG tablet Take 40 mg by mouth daily.  . metolazone (ZAROXOLYN) 2.5 MG tablet Take 2.5 mg by mouth daily as needed (for edema).   . mometasone (NASONEX) 50 MCG/ACT nasal spray Place 2 sprays into both nostrils at bedtime.  . Multiple Vitamin (MULTIVITAMIN WITH MINERALS) TABS tablet Take 1 tablet by mouth daily.  . Marland Kitchenmeprazole (PRILOSEC) 20 MG capsule Take 20 mg by mouth daily.  . potassium chloride (K-DUR,KLOR-CON) 10 MEQ tablet Take 30 mEq by mouth daily.  . silver sulfADIAZINE (SILVADENE) 1 % cream Apply 1 application topically daily.  . Marland Kitchenulfamethoxazole-trimethoprim (BACTRIM DS,SEPTRA DS) 800-160 MG tablet Take 1 tablet by mouth 3 (three) times a week.  . tacrolimus (PROGRAF) 0.5 MG capsule Take 1 mg by mouth 2 (two) times daily. 1 mg qam, 0.5 mg qpm  . valGANciclovir (VALCYTE) 450 MG tablet Take 2 tablets (900 mg total) by mouth 2 (two) times daily.   No facility-administered encounter medications on file as of 10/10/2015.    Functional Status:  In your present state of health, do you have any difficulty performing the following activities: 08/30/2015 07/18/2015  Hearing? N N  Vision? N Y  Difficulty concentrating  or making decisions? N Y  Walking or climbing stairs? N N  Dressing or bathing? N N  Doing errands, shopping? N N    Fall/Depression Screening: PHQ 2/9 Scores 10/10/2015 07/18/2015 12/06/2014 10/12/2014  PHQ - 2 Score 2 0 0 0    Assessment: [name] is feeling better than he did compared to when he came back from [place]- his A1C has greatly improved and he is now taking Lantus 20 units qhs and reports he takes Novolog "sliding scale" insulin before every meal (1-2 units).   He is NOT checking sugars before each meal so sometimes the Novolog is being given without truly knowing how much insulin should be given.  I suspect if he was  checking sugars as requested, his post prandial blood sugars would be better.     Plan:  [facility] CM Care Plan Problem One        Most Recent Value   Care Plan Problem One  Potential for long term complications    Role Documenting the Problem One  Care Management [place] for Problem One  Not Active   [facility] Long Term Goal (31-90 days)  Patient will maintain A1C 7% when it is rechecked in 3 months.    [facility]N Long Term Goal Start Date  03/22/15   [facility] Long Term Goal Met Date  10/10/15    [facility] CM Care Plan Problem Two        Most Recent Value   Care Plan Problem Two  Potential for elevated A1C > 7%   Role Documenting the Problem Two  Care Management Coordinator   Care Plan for Problem Two  Active   Interventions for Problem Two Long Term Goal   1.  reviewed the need for a balanced meal including carbs and protein  2. see MD for follow up with MDs- transplant/endo/family  3. discussed the importance of checking sugar with each meal to give the correct dose of Novolog based on the sliding scale, not a guess   [facility]N Long Term Goal (31-90) days  Patient will maintain A1C less than 7%   [facility]N Long Term Goal Start Date  10/10/15     Follow up in September, 2017   [name], RN, [place], [place], [place]:  [phone]  Fax:  [phone] E-mail: [name] [address] [place], [place]  [address]

## 2015-10-12 ENCOUNTER — Telehealth: Payer: Self-pay

## 2015-10-12 ENCOUNTER — Emergency Department: Payer: 59

## 2015-10-12 ENCOUNTER — Inpatient Hospital Stay
Admission: EM | Admit: 2015-10-12 | Discharge: 2015-10-18 | DRG: 580 | Disposition: A | Discharge status: Home / Self Care | Payer: 59 | Attending: Internal Medicine | Admitting: Internal Medicine

## 2015-10-12 DIAGNOSIS — L089 Local infection of the skin and subcutaneous tissue, unspecified: Secondary | ICD-10-CM | POA: Insufficient documentation

## 2015-10-12 DIAGNOSIS — Z841 Family history of disorders of kidney and ureter: Secondary | ICD-10-CM

## 2015-10-12 DIAGNOSIS — G4733 Obstructive sleep apnea (adult) (pediatric): Secondary | ICD-10-CM | POA: Diagnosis present

## 2015-10-12 DIAGNOSIS — E669 Obesity, unspecified: Secondary | ICD-10-CM | POA: Diagnosis present

## 2015-10-12 DIAGNOSIS — I25810 Atherosclerosis of coronary artery bypass graft(s) without angina pectoris: Secondary | ICD-10-CM | POA: Diagnosis not present

## 2015-10-12 DIAGNOSIS — I110 Hypertensive heart disease with heart failure: Secondary | ICD-10-CM | POA: Diagnosis present

## 2015-10-12 DIAGNOSIS — Z8249 Family history of ischemic heart disease and other diseases of the circulatory system: Secondary | ICD-10-CM | POA: Diagnosis not present

## 2015-10-12 DIAGNOSIS — E119 Type 2 diabetes mellitus without complications: Secondary | ICD-10-CM | POA: Diagnosis not present

## 2015-10-12 DIAGNOSIS — Z87891 Personal history of nicotine dependence: Secondary | ICD-10-CM | POA: Diagnosis not present

## 2015-10-12 DIAGNOSIS — W228XXA Striking against or struck by other objects, initial encounter: Secondary | ICD-10-CM

## 2015-10-12 DIAGNOSIS — L03116 Cellulitis of left lower limb: Secondary | ICD-10-CM | POA: Diagnosis present

## 2015-10-12 DIAGNOSIS — I482 Chronic atrial fibrillation: Secondary | ICD-10-CM | POA: Diagnosis present

## 2015-10-12 DIAGNOSIS — Z803 Family history of malignant neoplasm of breast: Secondary | ICD-10-CM

## 2015-10-12 DIAGNOSIS — K219 Gastro-esophageal reflux disease without esophagitis: Secondary | ICD-10-CM | POA: Diagnosis present

## 2015-10-12 DIAGNOSIS — Z833 Family history of diabetes mellitus: Secondary | ICD-10-CM

## 2015-10-12 DIAGNOSIS — I878 Other specified disorders of veins: Secondary | ICD-10-CM | POA: Diagnosis present

## 2015-10-12 DIAGNOSIS — E1122 Type 2 diabetes mellitus with diabetic chronic kidney disease: Secondary | ICD-10-CM | POA: Diagnosis present

## 2015-10-12 DIAGNOSIS — I872 Venous insufficiency (chronic) (peripheral): Secondary | ICD-10-CM | POA: Diagnosis present

## 2015-10-12 DIAGNOSIS — Z885 Allergy status to narcotic agent status: Secondary | ICD-10-CM

## 2015-10-12 DIAGNOSIS — Z6841 Body Mass Index (BMI) 40.0 and over, adult: Secondary | ICD-10-CM

## 2015-10-12 DIAGNOSIS — S8010XA Contusion of unspecified lower leg, initial encounter: Secondary | ICD-10-CM | POA: Diagnosis not present

## 2015-10-12 DIAGNOSIS — I509 Heart failure, unspecified: Secondary | ICD-10-CM | POA: Diagnosis not present

## 2015-10-12 DIAGNOSIS — Z951 Presence of aortocoronary bypass graft: Secondary | ICD-10-CM | POA: Diagnosis not present

## 2015-10-12 DIAGNOSIS — S8012XA Contusion of left lower leg, initial encounter: Secondary | ICD-10-CM | POA: Diagnosis not present

## 2015-10-12 DIAGNOSIS — Z794 Long term (current) use of insulin: Secondary | ICD-10-CM

## 2015-10-12 DIAGNOSIS — L0390 Cellulitis, unspecified: Secondary | ICD-10-CM | POA: Diagnosis present

## 2015-10-12 DIAGNOSIS — Z940 Kidney transplant status: Secondary | ICD-10-CM | POA: Diagnosis not present

## 2015-10-12 DIAGNOSIS — E662 Morbid (severe) obesity with alveolar hypoventilation: Secondary | ICD-10-CM | POA: Diagnosis not present

## 2015-10-12 DIAGNOSIS — I2510 Atherosclerotic heart disease of native coronary artery without angina pectoris: Secondary | ICD-10-CM | POA: Diagnosis present

## 2015-10-12 DIAGNOSIS — M7981 Nontraumatic hematoma of soft tissue: Secondary | ICD-10-CM | POA: Diagnosis not present

## 2015-10-12 DIAGNOSIS — M25472 Effusion, left ankle: Secondary | ICD-10-CM | POA: Diagnosis present

## 2015-10-12 DIAGNOSIS — I10 Essential (primary) hypertension: Secondary | ICD-10-CM | POA: Diagnosis not present

## 2015-10-12 DIAGNOSIS — E876 Hypokalemia: Secondary | ICD-10-CM | POA: Diagnosis present

## 2015-10-12 DIAGNOSIS — T148XXA Other injury of unspecified body region, initial encounter: Secondary | ICD-10-CM

## 2015-10-12 DIAGNOSIS — M7989 Other specified soft tissue disorders: Secondary | ICD-10-CM | POA: Diagnosis not present

## 2015-10-12 DIAGNOSIS — Z79899 Other long term (current) drug therapy: Secondary | ICD-10-CM

## 2015-10-12 DIAGNOSIS — M109 Gout, unspecified: Secondary | ICD-10-CM | POA: Diagnosis present

## 2015-10-12 DIAGNOSIS — Z800 Family history of malignant neoplasm of digestive organs: Secondary | ICD-10-CM

## 2015-10-12 DIAGNOSIS — J45909 Unspecified asthma, uncomplicated: Secondary | ICD-10-CM | POA: Diagnosis present

## 2015-10-12 DIAGNOSIS — Z9889 Other specified postprocedural states: Secondary | ICD-10-CM

## 2015-10-12 DIAGNOSIS — L02416 Cutaneous abscess of left lower limb: Secondary | ICD-10-CM | POA: Diagnosis not present

## 2015-10-12 DIAGNOSIS — T148 Other injury of unspecified body region: Secondary | ICD-10-CM | POA: Diagnosis not present

## 2015-10-12 DIAGNOSIS — L0291 Cutaneous abscess, unspecified: Secondary | ICD-10-CM | POA: Insufficient documentation

## 2015-10-12 DIAGNOSIS — S80812A Abrasion, left lower leg, initial encounter: Secondary | ICD-10-CM | POA: Diagnosis not present

## 2015-10-12 LAB — BASIC METABOLIC PANEL
Anion gap: 6 (ref 5–15)
BUN: 17 mg/dL (ref 6–20)
CHLORIDE: 100 mmol/L — AB (ref 101–111)
CO2: 29 mmol/L (ref 22–32)
Calcium: 9 mg/dL (ref 8.9–10.3)
Creatinine, Ser: 0.85 mg/dL (ref 0.61–1.24)
GFR calc Af Amer: >60 mL/min (ref >60)
GFR calc non Af Amer: >60 mL/min (ref >60)
GLUCOSE: 140 mg/dL — AB (ref 65–99)
POTASSIUM: 3.1 mmol/L — AB (ref 3.5–5.1)
Sodium: 135 mmol/L (ref 135–145)

## 2015-10-12 LAB — CBC
HCT: 34.9 % — ABNORMAL LOW (ref 40.0–52.0)
HEMOGLOBIN: 12.2 g/dL — AB (ref 13.0–18.0)
MCH: 30.5 pg (ref 26.0–34.0)
MCHC: 35 g/dL (ref 32.0–36.0)
MCV: 87 fL (ref 80.0–100.0)
Platelets: 176 10*3/uL (ref 150–440)
RBC: 4.01 MIL/uL — AB (ref 4.40–5.90)
RDW: 17.7 % — ABNORMAL HIGH (ref 11.5–14.5)
WBC: 6.6 10*3/uL (ref 3.8–10.6)

## 2015-10-12 LAB — GLUCOSE, CAPILLARY: GLUCOSE-CAPILLARY: 134 mg/dL — AB (ref 65–99)

## 2015-10-12 LAB — CK: CK TOTAL: 35 U/L — AB (ref 49–397)

## 2015-10-12 MED ORDER — OXYCODONE HCL 5 MG PO TABS
10.0000 mg | ORAL_TABLET | ORAL | Status: DC | PRN
  Administered 2015-10-12 – 2015-10-18 (×19): 10 mg via ORAL
  Filled 2015-10-12 (×20): qty 2

## 2015-10-12 MED ORDER — TACROLIMUS 1 MG PO CAPS
1.0000 mg | ORAL_CAPSULE | Freq: Every morning | ORAL | Status: DC
  Administered 2015-10-13 – 2015-10-18 (×6): 1 mg via ORAL
  Filled 2015-10-12 (×7): qty 1

## 2015-10-12 MED ORDER — SODIUM CHLORIDE 0.9 % IV SOLN: 500.0000 mg | Freq: Once | INTRAVENOUS | Status: DC

## 2015-10-12 MED ORDER — SODIUM CHLORIDE 0.9 % IV SOLN
4.0000 mg/kg | INTRAVENOUS | Status: DC
  Filled 2015-10-12: qty 9.62

## 2015-10-12 MED ORDER — ADULT MULTIVITAMIN W/MINERALS CH
1.0000 | ORAL_TABLET | Freq: Every day | ORAL | Status: DC
  Administered 2015-10-14 – 2015-10-18 (×5): 1 via ORAL
  Filled 2015-10-12 (×7): qty 1

## 2015-10-12 MED ORDER — SODIUM CHLORIDE 0.9 % IV SOLN
1.0000 g | Freq: Three times a day (TID) | INTRAVENOUS | Status: DC
  Administered 2015-10-12 – 2015-10-17 (×14): 1 g via INTRAVENOUS
  Filled 2015-10-12 (×17): qty 1

## 2015-10-12 MED ORDER — POTASSIUM CHLORIDE CRYS ER 20 MEQ PO TBCR
40.0000 meq | EXTENDED_RELEASE_TABLET | Freq: Once | ORAL | Status: AC
  Administered 2015-10-12: 40 meq via ORAL
  Filled 2015-10-12 (×2): qty 2

## 2015-10-12 MED ORDER — INSULIN ASPART 100 UNIT/ML FLEXPEN: 3.0000 [IU] | PEN_INJECTOR | Freq: Three times a day (TID) | SUBCUTANEOUS | Status: DC | PRN

## 2015-10-12 MED ORDER — TACROLIMUS 1 MG PO CAPS: 1.0000 mg | ORAL_CAPSULE | Freq: Two times a day (BID) | ORAL | Status: DC

## 2015-10-12 MED ORDER — HYDROMORPHONE HCL 1 MG/ML IJ SOLN
INTRAMUSCULAR | Status: AC
  Administered 2015-10-12: 1 mg via INTRAVENOUS
  Filled 2015-10-12: qty 1

## 2015-10-12 MED ORDER — HYDROMORPHONE HCL 1 MG/ML IJ SOLN
0.5000 mg | Freq: Once | INTRAMUSCULAR | Status: AC
  Administered 2015-10-12: 0.5 mg via INTRAVENOUS

## 2015-10-12 MED ORDER — SULFAMETHOXAZOLE-TRIMETHOPRIM 800-160 MG PO TABS
1.0000 | ORAL_TABLET | ORAL | Status: DC
  Administered 2015-10-13 – 2015-10-18 (×3): 1 via ORAL
  Filled 2015-10-12 (×4): qty 1

## 2015-10-12 MED ORDER — HYDROMORPHONE HCL 1 MG/ML IJ SOLN
INTRAMUSCULAR | Status: AC
  Filled 2015-10-12: qty 1

## 2015-10-12 MED ORDER — LISINOPRIL 20 MG PO TABS
40.0000 mg | ORAL_TABLET | Freq: Every day | ORAL | Status: DC
  Administered 2015-10-13 – 2015-10-18 (×6): 40 mg via ORAL
  Filled 2015-10-12 (×6): qty 2

## 2015-10-12 MED ORDER — PANTOPRAZOLE SODIUM 40 MG PO TBEC
40.0000 mg | DELAYED_RELEASE_TABLET | Freq: Every day | ORAL | Status: DC
  Administered 2015-10-13 – 2015-10-18 (×6): 40 mg via ORAL
  Filled 2015-10-12 (×6): qty 1

## 2015-10-12 MED ORDER — HYDROMORPHONE HCL 1 MG/ML IJ SOLN
1.0000 mg | INTRAMUSCULAR | Status: DC | PRN
  Administered 2015-10-12 – 2015-10-17 (×7): 1 mg via INTRAVENOUS
  Filled 2015-10-12 (×6): qty 1

## 2015-10-12 MED ORDER — SODIUM CHLORIDE 0.9 % IV SOLN
4.0000 mg/kg | INTRAVENOUS | Status: DC
  Filled 2015-10-12: qty 7.35

## 2015-10-12 MED ORDER — APIXABAN 2.5 MG PO TABS
5.0000 mg | ORAL_TABLET | Freq: Two times a day (BID) | ORAL | Status: DC
  Administered 2015-10-12 – 2015-10-13 (×2): 5 mg via ORAL
  Filled 2015-10-12 (×2): qty 2

## 2015-10-12 MED ORDER — VALGANCICLOVIR HCL 450 MG PO TABS
900.0000 mg | ORAL_TABLET | Freq: Two times a day (BID) | ORAL | Status: DC
  Administered 2015-10-12 – 2015-10-18 (×12): 900 mg via ORAL
  Filled 2015-10-12 (×14): qty 2

## 2015-10-12 MED ORDER — AMLODIPINE BESYLATE 5 MG PO TABS
5.0000 mg | ORAL_TABLET | Freq: Every day | ORAL | Status: DC
  Administered 2015-10-13 – 2015-10-18 (×6): 5 mg via ORAL
  Filled 2015-10-12 (×6): qty 1

## 2015-10-12 MED ORDER — SILVER SULFADIAZINE 1 % EX CREA
1.0000 "application " | TOPICAL_CREAM | Freq: Every day | CUTANEOUS | Status: DC
  Filled 2015-10-12: qty 85

## 2015-10-12 MED ORDER — FLUTICASONE PROPIONATE 50 MCG/ACT NA SUSP
1.0000 | Freq: Every day | NASAL | Status: DC
  Administered 2015-10-12 – 2015-10-17 (×6): 1 via NASAL
  Filled 2015-10-12: qty 16

## 2015-10-12 MED ORDER — ACETAMINOPHEN 325 MG PO TABS: 650.0000 mg | ORAL_TABLET | Freq: Four times a day (QID) | ORAL | Status: DC | PRN

## 2015-10-12 MED ORDER — BUPROPION HCL 75 MG PO TABS
75.0000 mg | ORAL_TABLET | Freq: Two times a day (BID) | ORAL | Status: DC
  Administered 2015-10-12 – 2015-10-18 (×12): 75 mg via ORAL
  Filled 2015-10-12 (×13): qty 1

## 2015-10-12 MED ORDER — INSULIN ASPART 100 UNIT/ML ~~LOC~~ SOLN: 0.0000 [IU] | Freq: Every day | SUBCUTANEOUS | Status: DC

## 2015-10-12 MED ORDER — INSULIN ASPART 100 UNIT/ML ~~LOC~~ SOLN
0.0000 [IU] | Freq: Three times a day (TID) | SUBCUTANEOUS | Status: DC
  Administered 2015-10-13 – 2015-10-15 (×5): 1 [IU] via SUBCUTANEOUS
  Administered 2015-10-15: 2 [IU] via SUBCUTANEOUS
  Administered 2015-10-16: 1 [IU] via SUBCUTANEOUS
  Administered 2015-10-17 (×2): 2 [IU] via SUBCUTANEOUS
  Filled 2015-10-12 (×3): qty 1
  Filled 2015-10-12 (×3): qty 2
  Filled 2015-10-12 (×3): qty 1

## 2015-10-12 MED ORDER — TACROLIMUS 0.5 MG PO CAPS
0.5000 mg | ORAL_CAPSULE | Freq: Every day | ORAL | Status: DC
  Administered 2015-10-12 – 2015-10-17 (×6): 0.5 mg via ORAL
  Filled 2015-10-12 (×7): qty 1

## 2015-10-12 MED ORDER — INSULIN GLARGINE 100 UNIT/ML ~~LOC~~ SOLN
20.0000 [IU] | Freq: Every day | SUBCUTANEOUS | Status: DC
  Administered 2015-10-12 – 2015-10-13 (×2): 20 [IU] via SUBCUTANEOUS
  Filled 2015-10-12 (×3): qty 0.2

## 2015-10-12 MED ORDER — ACETAMINOPHEN 650 MG RE SUPP: 650.0000 mg | Freq: Four times a day (QID) | RECTAL | Status: DC | PRN

## 2015-10-12 NOTE — ED Provider Notes (Signed)
[facility] Emergency Department Provider Note        Time seen: ----------------------------------------- 5:10 PM on 10/12/2015 -----------------------------------------    I have reviewed the triage vital signs and the nursing notes.   HISTORY  Chief Complaint Wound Infection    HPI [name] is a 55 y.o. male who presents to ER being brought by private vehicle for left lower leg wound infection. Reportedly he hit his leg on a number list and Sunday night. He was placed on Keflex and Silvadene by his primary care doctor on Monday. Patient reports swelling and pain has persisted. He has had some discharge from the wound. He denies fevers or chills.Patient reports history of renal transplant. He typically has not had issues of wound infections in the past.   Past Medical History  Diagnosis Date  . Hypertension   . Hyperlipidemia     low since renal failure  . Kidney transplant status, cadaveric 2012  . Depression   . GERD (gastroesophageal reflux disease)   . Diabetes mellitus   . Gout   . Asthma   . ESRD (end stage renal disease) ([place]) 2005    Dialysis then renal transplant  . CAD (coronary artery disease) 2005    had CABG  . CHF (congestive heart failure) ([place])   . Pneumonia   . Wears glasses   . Sleep apnea     bipap with oxygen 2 l  . Atrial fibrillation ([place])   . Trigger finger   . Amnestic MCI (mild cognitive impairment with memory loss)   . Dyspnea   . Benign neoplasm of colon   . Diverticulosis   . Chronic venous stasis dermatitis of both lower extremities   . Purpura ([place])   . Erectile dysfunction   . Obesity   . Atrial fibrillation ([place]) 2016    Patient Active Problem List   Diagnosis Date Noted  . Skin ulcer ([place]) 10/09/2015  . Acute renal failure (ARF) ([place]) 08/30/2015  . BPH with obstruction/lower urinary tract symptoms 09/25/2014  . Hypogonadism in male 09/25/2014  . Immunosuppressed status ([place]) 09/09/2014  .  Atrial fibrillation ([place]) 07/18/2014  . Bariatric surgery status 04/25/2014  . Chronic systolic heart failure ([place]) 12/13/2013  . Pre-operative respiratory examination 11/23/2013  . MDD (recurrent major depressive disorder) in remission ([place]) 10/04/2013  . Benign neoplasm of colon 04/22/2013  . Diverticulosis of colon (without mention of hemorrhage) 04/22/2013  . History of renal transplant 03/01/2013  . Asthma, persistent controlled 02/25/2013  . Erectile dysfunction 10/20/2012  . Obstructive sleep apnea 09/29/2012  . Gout 09/13/2012  . Obesity hypoventilation syndrome ([place]) 03/31/2012  . Routine general medical examination at a health care facility 11/12/2011  . Trigger finger 09/25/2011  . Type 2 diabetes, controlled, with renal manifestation ([place]) 03/01/2011  . Essential (primary) hypertension 12/17/2010  . H/O kidney transplant 12/17/2010  . BMI 40.0-44.9, adult ([place]) 12/17/2010  . CAD, multiple vessel 12/17/2010  . CAD (coronary artery disease)   . Hypertension   . Hyperlipidemia   . ESRD (end stage renal disease) [facility])     Past Surgical History  Procedure Laterality Date  . Kidney transplant  09/13/2010    cadaver--at [facility]  . Tympanic membrane repair  1/12    left  . Dg angio av shunt*l*      right and left upper arms  . Fasciotomy  03/03/2012    Procedure: FASCIOTOMY;  Surgeon: [name], MD;  Location: [place];  Service: Orthopedics;  Laterality: Right;  FASCIOTOMY RIGHT SMALL FINGER  . Cardiac catheterization      [facility]  . Coronary artery bypass graft  2005    X 4  . Colonoscopy with propofol N/A 04/22/2013    Procedure: COLONOSCOPY WITH PROPOFOL;  Surgeon: [name], MD;  Location: [facility];  Service: Endoscopy;  Laterality: N/A;  . Fasciotomy Left 08/17/2013    Procedure: FASCIOTOMY LEFT RING;  Surgeon: [name], MD;  Location: [place];  Service: Orthopedics;  Laterality: Left;  . Bariatric surgery    .  Vasectomy      Allergies Morphine  Social History Social History  Substance Use Topics  . Smoking status: Former Smoker    Types: Cigars    Quit date: 09/02/1994  . Smokeless tobacco: Never Used     Comment: cigar- once in a while  . Alcohol Use: 0.0 - 0.6 oz/week    0 Glasses of wine, 0-1 Standard drinks or equivalent per week     Comment: occasional- 3 in the past year    Review of Systems Constitutional: Negative for fever. Cardiovascular: Negative for chest pain. Respiratory: Negative for shortness of breath. Gastrointestinal: Negative for abdominal pain, vomiting and diarrhea. Genitourinary: Negative for dysuria. Musculoskeletal: Positive for left leg pain Skin: Positive for abrasion left leg Neurological: Negative for headaches, focal weakness or numbness.  10-point ROS otherwise negative.  ____________________________________________   PHYSICAL EXAM:  VITAL SIGNS: ED Triage Vitals  Enc Vitals Group     BP 10/12/15 1633 168/78 mmHg     Pulse Rate 10/12/15 1633 97     Resp 10/12/15 1633 18     Temp 10/12/15 1633 98.3 F (36.8 C)     Temp Source 10/12/15 1633 Oral     SpO2 10/12/15 1633 94 %     Weight 10/12/15 1633 265 lb (120.203 kg)     Height 10/12/15 1633 5' 10" (1.778 m)     Head Cir --      Peak Flow --      Pain Score 10/12/15 1640 10     Pain Loc --      Pain Edu? --      Excl. in [place]? --    Constitutional: Alert and oriented. Mild distress Eyes: Conjunctivae are normal. PERRL. Normal extraocular movements. ENT   Head: Normocephalic and atraumatic.   Nose: No congestion/rhinnorhea.   Mouth/Throat: Mucous membranes are moist.   Neck: No stridor. Cardiovascular: Normal rate, regular rhythm. No murmurs, rubs, or gallops. Respiratory: Normal respiratory effort without tachypnea nor retractions. Breath sounds are clear and equal bilaterally. No wheezes/rales/rhonchi. Gastrointestinal: Soft and nontender. Normal bowel  sounds Musculoskeletal: Severe pain with range of motion of left lower extremity. There is severe tenderness located around the distal tibia anteriorly, large skin abrasion with surrounding hematoma and erythema Neurologic:  Normal speech and language. No gross focal neurologic deficits are appreciated.  Skin:  Abrasion, hematoma as noted above the left leg Psychiatric: Mood and affect are normal. Speech and behavior are normal.  ____________________________________________  ED COURSE:  Pertinent labs & imaging results that were available during my care of the patient were reviewed by me and considered in my medical decision making (see chart for details). Patient presents with pain out of proportion to the injury. I will assess with basic labs, imaging. ____________________________________________    LABS (pertinent positives/negatives)  Labs Reviewed  CBC - Abnormal; Notable for the following:    RBC 4.01 (*)  Hemoglobin 12.2 (*)    HCT 34.9 (*)    RDW 17.7 (*)    All other components within normal limits  BASIC METABOLIC PANEL - Abnormal; Notable for the following:    Potassium 3.1 (*)    Chloride 100 (*)    Glucose, Bld 140 (*)    All other components within normal limits  AEROBIC/ANAEROBIC CULTURE (SURGICAL/DEEP WOUND)    RADIOLOGY Images were viewed by me  Left tib-fib x-rays  IMPRESSION: Soft tissue swelling, more focally in the mid anterior shin region.  Peripheral atherosclerosis  No acute osseous finding. ____________________________________________  FINAL ASSESSMENT AND PLAN  Leg abrasion, wound infection  Plan: Patient with labs and imaging as dictated above. Patient is immunosuppressed and presents with pain out of proportion to examination. I'm concerned for worsening wound infection. I have discussed with infectious disease recommends daptomycin and meropenem. I will discuss with the hospitalist for admission.   Earleen Newport, MD   Note:  This dictation was prepared with Dragon dictation. Any transcriptional errors that result from this process are unintentional   Earleen Newport, MD 10/12/15 [phone]

## 2015-10-12 NOTE — H&P (Signed)
[facility] [place] NAME: [name]    MR#:  [id number]  DATE OF BIRTH:  09-Jun-1960  DATE OF ADMISSION:  10/12/2015  PRIMARY CARE PHYSICIAN: [name], MD   REQUESTING/REFERRING PHYSICIAN: Dr. Lenise Arena  CHIEF COMPLAINT:   Chief Complaint  Patient presents with  . Wound Infection    HISTORY OF PRESENT ILLNESS:  [name]  is a 55 y.o. male with a known history of  Hypertension, insulin-dependent diabetes mellitus, obstructive sleep apnea on CPAP, chronic atrial fibrillation, history of end-stage renal disease now status post renal transplant and normal kidney function, history of CAD status post CABG presents to the hospital secondary to worsening left shin wound. Patient was here last month for fevers of unknown origin and had CMV PCR positive. He has been doing fine at home since discharge. 4 days ago while walking, he bumped into an umbrella stand and since then has been having left shin pain. Focal infections started, has seen his PCP and was started on Keflex as outpatient. Now he has superficial skin tear with exposed underlying subcutaneous tissues with purulent discharge. Worsening swelling of the shin and extensive pain of the whole leg. -Due to his risk factors of being on immunosuppressants and being a diabetic, he is being admitted for IV antibiotics and failed outpatient antibiotics. Patient does complain of worsening fatigue, chills. Denies any fevers nausea or vomiting.  PAST MEDICAL HISTORY:   Past Medical History  Diagnosis Date  . Hypertension   . Hyperlipidemia     low since renal failure  . Kidney transplant status, cadaveric 2012  . Depression   . GERD (gastroesophageal reflux disease)   . Diabetes mellitus   . Gout   . Asthma   . ESRD (end stage renal disease) ([place]) 2005    Dialysis then renal transplant  . CAD (coronary artery disease) 2005    had CABG  . CHF (congestive heart  failure) ([place])   . Pneumonia   . Wears glasses   . Sleep apnea     bipap with oxygen 2 l  . Atrial fibrillation ([place])   . Trigger finger   . Amnestic MCI (mild cognitive impairment with memory loss)   . Dyspnea   . Benign neoplasm of colon   . Diverticulosis   . Chronic venous stasis dermatitis of both lower extremities   . Purpura ([place])   . Erectile dysfunction   . Obesity   . Atrial fibrillation ([place]) 2016    PAST SURGICAL HISTORY:   Past Surgical History  Procedure Laterality Date  . Kidney transplant  09/13/2010    cadaver--at [facility]  . Tympanic membrane repair  1/12    left  . Dg angio av shunt*l*      right and left upper arms  . Fasciotomy  03/03/2012    Procedure: FASCIOTOMY;  Surgeon: [name], MD;  Location: [place];  Service: Orthopedics;  Laterality: Right;  FASCIOTOMY RIGHT SMALL FINGER  . Cardiac catheterization      [facility]  . Coronary artery bypass graft  2005    X 4  . Colonoscopy with propofol N/A 04/22/2013    Procedure: COLONOSCOPY WITH PROPOFOL;  Surgeon: [name], MD;  [facility]: [facility];  Service: Endoscopy;  Laterality: N/A;  . Fasciotomy Left 08/17/2013    Procedure: FASCIOTOMY LEFT RING;  Surgeon: [name], MD;  Location: [place];  Service: Orthopedics;  Laterality:  Left;  . Bariatric surgery    . Vasectomy      SOCIAL HISTORY:   Social History  Substance Use Topics  . Smoking status: Former Smoker    Types: Cigars    Quit date: 09/02/1994  . Smokeless tobacco: Never Used     Comment: cigar- once in a while  . Alcohol Use: 0.0 - 0.6 oz/week    0 Glasses of wine, 0-1 Standard drinks or equivalent per week     Comment: occasional- 3 in the past year    FAMILY HISTORY:   Family History  Problem Relation Age of Onset  . Heart disease Father   . Kidney failure Father   . Kidney disease Father   . Diabetes Maternal Grandmother   . Breast cancer Maternal Grandmother   . Liver cancer  Paternal Uncle   . Liver cancer Paternal Grandmother   . Prostate cancer Neg Hx     DRUG ALLERGIES:   Allergies  Allergen Reactions  . Morphine Itching    REVIEW OF SYSTEMS:   Review of Systems  Constitutional: Positive for chills and malaise/fatigue. Negative for fever and weight loss.  HENT: Negative for ear discharge, ear pain, nosebleeds and tinnitus.   Eyes: Negative for blurred vision, double vision and photophobia.  Respiratory: Negative for cough, hemoptysis, shortness of breath and wheezing.   Cardiovascular: Positive for leg swelling. Negative for chest pain, palpitations and orthopnea.  Gastrointestinal: Negative for heartburn, nausea, vomiting, abdominal pain, diarrhea, constipation and melena.  Genitourinary: Negative for dysuria, urgency, frequency and hematuria.  Musculoskeletal: Positive for myalgias and joint pain. Negative for back pain and neck pain.  Skin: Negative for rash.  Neurological: Negative for dizziness, tingling, sensory change, speech change, focal weakness and headaches.  Endo/Heme/Allergies: Does not bruise/bleed easily.  Psychiatric/Behavioral: Negative for depression.    MEDICATIONS AT HOME:   Prior to Admission medications   Medication Sig Start Date End Date Taking? Authorizing Provider  amLODipine (NORVASC) 5 MG tablet Take 1 tablet (5 mg total) by mouth daily. 11/15/14   [name], MD  apixaban (ELIQUIS) 5 MG TABS tablet Take 5 mg by mouth 2 (two) times daily.    Historical Provider, MD  buPROPion (WELLBUTRIN) 75 MG tablet Take 1 tablet (75 mg total) by mouth 2 (two) times daily. 11/02/14   [name], MD  cephALEXin (KEFLEX) 500 MG capsule Take 1 capsule (500 mg total) by mouth 3 (three) times daily. 10/09/15   [name], MD  ELIQUIS 5 MG TABS tablet TAKE 1 TABLET BY MOUTH 2 TIMES DAILY. 09/15/15   [name], MD  furosemide (LASIX) 40 MG tablet Take 40 mg by mouth 2 (two) times daily. Every other day    Historical  Provider, MD  HYDROcodone-acetaminophen (NORCO/VICODIN) 5-325 MG tablet Take 1 tablet by mouth every 8 (eight) hours as needed for moderate pain. 10/10/15   Jearld Fenton, NP  insulin aspart (NOVOLOG FLEXPEN) 100 UNIT/ML FlexPen Inject 3-5 Units into the skin 3 (three) times daily with meals as needed for high blood sugar. Pt uses as needed per sliding scale.    Historical Provider, MD  insulin glargine (LANTUS) 100 unit/mL SOPN Inject 20 Units into the skin at bedtime.    Historical Provider, MD  lisinopril (PRINIVIL,ZESTRIL) 40 MG tablet Take 40 mg by mouth daily.    Historical Provider, MD  metolazone (ZAROXOLYN) 2.5 MG tablet Take 2.5 mg by mouth daily as needed (for edema).  Historical Provider, MD  mometasone (NASONEX) 50 MCG/ACT nasal spray Place 2 sprays into both nostrils at bedtime.    Historical Provider, MD  Multiple Vitamin (MULTIVITAMIN WITH MINERALS) TABS tablet Take 1 tablet by mouth daily.    Historical Provider, MD  omeprazole (PRILOSEC) 20 MG capsule Take 20 mg by mouth daily.    Historical Provider, MD  potassium chloride (K-DUR,KLOR-CON) 10 MEQ tablet Take 30 mEq by mouth daily.    Historical Provider, MD  silver sulfADIAZINE (SILVADENE) 1 % cream Apply 1 application topically daily. 10/09/15   [name], MD  sulfamethoxazole-trimethoprim (BACTRIM DS,SEPTRA DS) 800-160 MG tablet Take 1 tablet by mouth 3 (three) times a week. 09/04/15   Bettey Costa, MD  tacrolimus (PROGRAF) 0.5 MG capsule Take 1 mg by mouth 2 (two) times daily. 1 mg qam, 0.5 mg qpm    Historical Provider, MD  valGANciclovir (VALCYTE) 450 MG tablet Take 2 tablets (900 mg total) by mouth 2 (two) times daily. 09/04/15   Bettey Costa, MD      VITAL SIGNS:  Blood pressure 168/78, pulse 97, temperature 98.3 F (36.8 C), temperature source Oral, resp. rate 18, height _0  (1.778 m), weight 120.203 kg (265 lb), SpO2 94 %.  PHYSICAL EXAMINATION:   Physical Exam  GENERAL:  55 y.o.-year-old obese patient lying  in the bed with no acute distress.  EYES: Pupils equal, round, reactive to light and accommodation. No scleral icterus. Extraocular muscles intact.  HEENT: Head atraumatic, normocephalic. Oropharynx and nasopharynx clear.  NECK:  Supple, no jugular venous distention. No thyroid enlargement, no tenderness.  LUNGS: Normal breath sounds bilaterally, no wheezing, rales,rhonchi or crepitation. No use of accessory muscles of respiration.  CARDIOVASCULAR: S1, S2 normal. No murmurs, rubs, or gallops.  ABDOMEN: Soft, nontender, nondistended. Bowel sounds present. No organomegaly or mass.  EXTREMITIES: Right leg normal, good pulse, left leg shin has 6x3 cm open superficial wound with Purulent discharge, granulation tissue underneath. Swelling on the shin which is very tender. NEUROLOGIC: Cranial nerves II through XII are intact. Muscle strength 5/5 in all extremities- except left leg due to pain. Sensation intact. Gait not checked.  PSYCHIATRIC: The patient is alert and oriented x 3.  SKIN: No obvious rash, lesion, or ulcer.   LABORATORY PANEL:   CBC  Recent Labs Lab 10/12/15 1642  WBC 6.6  HGB 12.2*  HCT 34.9*  PLT 176   ------------------------------------------------------------------------------------------------------------------  Chemistries   Recent Labs Lab 10/12/15 1642  NA 135  K 3.1*  CL 100*  CO2 29  GLUCOSE 140*  BUN 17  CREATININE 0.85  CALCIUM 9.0   ------------------------------------------------------------------------------------------------------------------  Cardiac Enzymes No results for input(s): TROPONINI in the last 168 hours. ------------------------------------------------------------------------------------------------------------------  RADIOLOGY:  Dg Tibia/fibula Left  10/12/2015  CLINICAL DATA:  Left lower leg trauma, injury, superficial cellulitis. EXAM: LEFT TIBIA AND FIBULA - 2 VIEW COMPARISON:  None available FINDINGS: Peripheral atherosclerosis  noted. Mild diffuse soft tissue swelling. Left tibia and fibula appear intact. Focal mid anterior shin swelling noted. No underlying radiopaque foreign body. Negative for fracture. No acute osseous finding by plain radiography. IMPRESSION: Soft tissue swelling, more focally in the mid anterior shin region. Peripheral atherosclerosis No acute osseous finding. Electronically Signed   By: Jerilynn Mages.  Shick M.D.   On: 10/12/2015 17:34    EKG:   Orders placed or performed during the hospital encounter of 08/30/15  . EKG 12-Lead  . EKG 12-Lead  . ED EKG  . ED EKG    IMPRESSION  AND PLAN:   Perl Cuffe  is a 55 y.o. male with a known history of  Hypertension, insulin-dependent diabetes mellitus, obstructive sleep apnea on CPAP, chronic atrial fibrillation, history of end-stage renal disease now status post renal transplant and normal kidney function, history of CAD status post CABG presents to the hospital secondary to worsening left shin wound.  #1 left leg cellulitis with open ulcer-x-ray of the leg with no bone involvement. -Significant subcutaneous tissue swelling noted. -Consult with ID from [facility]- Dr. Johnnye Sima- recommended daptomycin ( less nephrotoxic than vancomycin) and meropenem -Follow blood and wound cultures. -Pain control recommended.  #2 hypertension-continue lisinopril and Norvasc  #3 hypokalemia-being replaced  #4 history of end-stage renal disease status post renal transplant-currently on Prograf and Valcyte -Continue Bactrim for PCP prophylaxis. Hold Lasix and metolazone for now.  #5 diabetes mellitus-continue Lantus and NovoLog. Added sliding scale insulin  #6 DVT prophylaxis-on eliquis for chronic A. fib. So continue.   All the records are reviewed and case discussed with ED provider. Management plans discussed with the patient, family and they are in agreement.  CODE STATUS: Full Code  TOTAL TIME TAKING CARE OF THIS PATIENT: 50 minutes.    Syrena Burges M.D on  10/12/2015 at 6:57 PM  Between 7am to 6pm - Pager - [phone]  After 6pm go to [email or web address] - password [email or web address]  Office  [phone]  CC: Primary care physician; [name], MD

## 2015-10-12 NOTE — Telephone Encounter (Signed)
Pt left v/m; leg has worsened; more drainage, fever,leg feels warm, 4 cm hematoma, cannot bear weight on leg and leg is very painful.  When I called pt back he is at [facility] ED. Pt said he is not sure he is staying at ED, but advised if in a lot of pain with other symptoms he needs to be seen. Pt said he would decide; he already has appt with Dr Silvio Pate on 10/13/15 at 10:15. FYI to Dr Silvio Pate.

## 2015-10-12 NOTE — Telephone Encounter (Signed)
I will see him tomorrow Would probably broaden coverage and consider rocephin

## 2015-10-12 NOTE — ED Notes (Signed)
Pt arrives to ER via POV c/o left lower leg wound and infection. Pt hit anterior left lower leg on umbrella stand Sunday night. Pt placed on Keflex and Caryl Never Monday by PCP, no better. Pt updated on tetanus shot this week. Wound yellow, purulent discharge, foul odor. Painful. Pt alert and oriented X4, active, cooperative, pt in NAD. RR even and unlabored, color WNL.

## 2015-10-12 NOTE — Consult Note (Addendum)
Pharmacy Antibiotic Note  [name] is a 55 y.o. male admitted on 10/12/2015 with leg wound infection.  Pharmacy has been consulted for daptomycin and meropenem dosing.  Plan: Pt has a hx of kidney transplant. Dr. Jimmye Norman spoke with Dr. Johnnye Sima of ID who recommended daptomycin.  Meropenem 1g q 8 hours daptomycin 57m/kg q 24 hours. Will get a baseline CPK and monitor CPK weekly  Height: 5' 10" (177.8 cm) Weight: 265 lb (120.203 kg) IBW/kg (Calculated) : 73  Temp (24hrs), Avg:98.3 F (36.8 C), Min:98.3 F (36.8 C), Max:98.3 F (36.8 C)   Recent Labs Lab 10/12/15 1642  WBC 6.6  CREATININE 0.85    Estimated Creatinine Clearance: 126.1 mL/min (by C-G formula based on Cr of 0.85).    Allergies  Allergen Reactions  . Morphine Itching    Antimicrobials this admission: daptomycin 7/20 >>  meropenem 7/20 >>   Dose adjustments this admission:   Microbiology results:   Thank you for allowing pharmacy to be a part of this patient's care.  MRamond Dial Pharm.D Clinical Pharmacist   10/12/2015 6:49 PM

## 2015-10-13 ENCOUNTER — Ambulatory Visit: Payer: 59 | Admitting: Internal Medicine

## 2015-10-13 ENCOUNTER — Inpatient Hospital Stay: Payer: 59

## 2015-10-13 DIAGNOSIS — L02416 Cutaneous abscess of left lower limb: Secondary | ICD-10-CM

## 2015-10-13 DIAGNOSIS — S8012XA Contusion of left lower leg, initial encounter: Secondary | ICD-10-CM

## 2015-10-13 DIAGNOSIS — L089 Local infection of the skin and subcutaneous tissue, unspecified: Secondary | ICD-10-CM | POA: Insufficient documentation

## 2015-10-13 DIAGNOSIS — L03116 Cellulitis of left lower limb: Secondary | ICD-10-CM

## 2015-10-13 DIAGNOSIS — T148XXA Other injury of unspecified body region, initial encounter: Secondary | ICD-10-CM

## 2015-10-13 LAB — BASIC METABOLIC PANEL
Anion gap: 4 — ABNORMAL LOW (ref 5–15)
BUN: 15 mg/dL (ref 6–20)
CO2: 31 mmol/L (ref 22–32)
Calcium: 8.8 mg/dL — ABNORMAL LOW (ref 8.9–10.3)
Chloride: 103 mmol/L (ref 101–111)
Creatinine, Ser: 0.8 mg/dL (ref 0.61–1.24)
GFR calc Af Amer: >60 mL/min (ref >60)
GLUCOSE: 106 mg/dL — AB (ref 65–99)
POTASSIUM: 3.4 mmol/L — AB (ref 3.5–5.1)
Sodium: 138 mmol/L (ref 135–145)

## 2015-10-13 LAB — CBC
HCT: 33.4 % — ABNORMAL LOW (ref 40.0–52.0)
Hemoglobin: 11.7 g/dL — ABNORMAL LOW (ref 13.0–18.0)
MCH: 30.1 pg (ref 26.0–34.0)
MCHC: 35 g/dL (ref 32.0–36.0)
MCV: 86 fL (ref 80.0–100.0)
PLATELETS: 160 10*3/uL (ref 150–440)
RBC: 3.89 MIL/uL — AB (ref 4.40–5.90)
RDW: 17.9 % — AB (ref 11.5–14.5)
WBC: 7.4 10*3/uL (ref 3.8–10.6)

## 2015-10-13 LAB — GLUCOSE, CAPILLARY
GLUCOSE-CAPILLARY: 102 mg/dL — AB (ref 65–99)
GLUCOSE-CAPILLARY: 142 mg/dL — AB (ref 65–99)
GLUCOSE-CAPILLARY: 135 mg/dL — AB (ref 65–99)
Glucose-Capillary: 167 mg/dL — ABNORMAL HIGH (ref 65–99)

## 2015-10-13 LAB — HEMOGLOBIN A1C: Hgb A1c MFr Bld: 7.3 % — ABNORMAL HIGH (ref 4.0–6.0)

## 2015-10-13 MED ORDER — SODIUM CHLORIDE 0.9 % IV SOLN
480.0000 mg | INTRAVENOUS | Status: AC
  Administered 2015-10-13: 480 mg via INTRAVENOUS
  Filled 2015-10-13: qty 9.6

## 2015-10-13 MED ORDER — POTASSIUM CHLORIDE CRYS ER 20 MEQ PO TBCR
40.0000 meq | EXTENDED_RELEASE_TABLET | Freq: Once | ORAL | Status: AC
Start: 2015-10-13 — End: 2015-10-13
  Administered 2015-10-13: 40 meq via ORAL
  Filled 2015-10-13: qty 2

## 2015-10-13 MED ORDER — MUPIROCIN CALCIUM 2 % EX CREA
TOPICAL_CREAM | Freq: Every day | CUTANEOUS | Status: DC
  Administered 2015-10-13 – 2015-10-16 (×3): via TOPICAL
  Filled 2015-10-13 (×2): qty 15

## 2015-10-13 MED ORDER — SODIUM CHLORIDE 0.9 % IV SOLN
480.0000 mg | INTRAVENOUS | Status: DC
  Administered 2015-10-14 – 2015-10-18 (×5): 480 mg via INTRAVENOUS
  Filled 2015-10-13 (×6): qty 9.6

## 2015-10-13 NOTE — Progress Notes (Signed)
Called Dr. Ara Kussmaul regarding putting in an order for a wound consult.  Doctor agreed and appropriate orders were placed.  [name]  10/13/2015  2:35 AM

## 2015-10-13 NOTE — Progress Notes (Signed)
[place] [facility] [place] NAME: [name]    MR#:  [id number]  DATE OF BIRTH:  Sep 27, 1960  SUBJECTIVE:   Pt. Here due to left lower ext. Wound.  Still having significant pain in the left lower extremity wound. Afebrile, minimal drainage from the wound.  REVIEW OF SYSTEMS:    Review of Systems  Constitutional: Negative for fever and chills.  HENT: Negative for congestion and tinnitus.   Eyes: Negative for blurred vision and double vision.  Respiratory: Negative for cough, shortness of breath and wheezing.   Cardiovascular: Negative for chest pain, orthopnea and PND.  Gastrointestinal: Negative for nausea, vomiting, abdominal pain and diarrhea.  Genitourinary: Negative for dysuria and hematuria.  Neurological: Negative for dizziness, sensory change and focal weakness.  All other systems reviewed and are negative.   Nutrition: Heart/Carb modified. Tolerating Diet: Yes Tolerating PT: Await Eval.    DRUG ALLERGIES:   Allergies  Allergen Reactions  . Morphine Itching    VITALS:  Blood pressure 132/67, pulse 72, temperature 98.1 F (36.7 C), temperature source Oral, resp. rate 18, height _0  (1.778 m), weight 120.203 kg (265 lb), SpO2 94 %.  PHYSICAL EXAMINATION:   Physical Exam  GENERAL:  55 y.o.-year-old patient lying in the bed in no acute distress.  EYES: Pupils equal, round, reactive to light and accommodation. No scleral icterus. Extraocular muscles intact.  HEENT: Head atraumatic, normocephalic. Oropharynx and nasopharynx clear.  NECK:  Supple, no jugular venous distention. No thyroid enlargement, no tenderness.  LUNGS: Normal breath sounds bilaterally, no wheezing, rales, rhonchi. No use of accessory muscles of respiration.  CARDIOVASCULAR: S1, S2 normal. No murmurs, rubs, or gallops.  ABDOMEN: Soft, nontender, nondistended. Bowel sounds present. No organomegaly or mass.  EXTREMITIES: No cyanosis, clubbing or edema b/l.   Left lower extremity wound. Small tense area which appears to be hematoma from trauma. Slight whitish discharge from the distal aspect of the wound.    NEUROLOGIC: Cranial nerves II through XII are intact. No focal Motor or sensory deficits b/l.   PSYCHIATRIC: The patient is alert and oriented x 3.  SKIN: No obvious rash, lesion, or ulcer.    LABORATORY PANEL:   CBC  Recent Labs Lab 10/13/15 0453  WBC 7.4  HGB 11.7*  HCT 33.4*  PLT 160   ------------------------------------------------------------------------------------------------------------------  Chemistries   Recent Labs Lab 10/13/15 0453  NA 138  K 3.4*  CL 103  CO2 31  GLUCOSE 106*  BUN 15  CREATININE 0.80  CALCIUM 8.8*   ------------------------------------------------------------------------------------------------------------------  Cardiac Enzymes No results for input(s): TROPONINI in the last 168 hours. ------------------------------------------------------------------------------------------------------------------  RADIOLOGY:  Dg Tibia/fibula Left  10/12/2015  CLINICAL DATA:  Left lower leg trauma, injury, superficial cellulitis. EXAM: LEFT TIBIA AND FIBULA - 2 VIEW COMPARISON:  None available FINDINGS: Peripheral atherosclerosis noted. Mild diffuse soft tissue swelling. Left tibia and fibula appear intact. Focal mid anterior shin swelling noted. No underlying radiopaque foreign body. Negative for fracture. No acute osseous finding by plain radiography. IMPRESSION: Soft tissue swelling, more focally in the mid anterior shin region. Peripheral atherosclerosis No acute osseous finding. Electronically Signed   By: Jerilynn Mages.  Shick M.D.   On: 10/12/2015 17:34     ASSESSMENT AND PLAN:   [name] is a 55 y.o. male with a known history of Hypertension, insulin-dependent diabetes mellitus, obstructive sleep apnea on CPAP, chronic atrial fibrillation, history of end-stage renal disease now status post renal  transplant and normal kidney function,  history of CAD status post CABG presents to the hospital secondary to worsening left shin wound.  #1 left leg cellulitis with open ulcer-x-ray of the leg -Significant subcutaneous tissue swelling noted. -Continue IV daptomycin, meropenem. Cultures so far negative. -I will get a CT scan without contrast to further look at the wound for possible underlying abscess. Also get surgical consult to see if it needs a incision and drainage. -Continue supportive care as mentioned above.  #2 hypertension-continue lisinopril and Norvasc  #3 hypokalemia- improved w/ supplementation and will monitor.   #4 history of end-stage renal disease status post renal transplant-currently on Prograf and Valcyte -Continue Bactrim for PCP prophylaxis. - cont. To hold diuretics for now.    #5 diabetes mellitus-continue Lantus and Novolog SS and will monitor.  #6. Hx of chronic a. Fib - rate controlled.    - cont. Eliquis.     All the records are reviewed and case discussed with Care Management/Social Workerr. Management plans discussed with the patient, family and they are in agreement.  CODE STATUS: Full  DVT Prophylaxis: Eliquis  TOTAL TIME TAKING CARE OF THIS PATIENT: 30 minutes.   POSSIBLE D/C IN 2-3 DAYS, DEPENDING ON CLINICAL CONDITION.   Henreitta Leber M.D on 10/13/2015 at 1:52 PM  Between 7am to 6pm - Pager - [phone]  After 6pm go to [email or web address] - password [email or web address] [facility]  Office  [phone]  CC: Primary care physician; [name], MD

## 2015-10-13 NOTE — Consult Note (Signed)
Patient ID: [name], male   DOB: 1960-06-16, 55 y.o.   MRN: [id number]  History of Present Illness [name] is a 55 y.o. male in consultation for left leg soft tissue infection with possible abscess.. Significant history of Hypertension, insulin-dependent diabetes mellitus, obstructive sleep apnea on CPAP, chronic atrial fibrillation, history of end-stage renal disease now status post renal transplant and normal kidney function, history of CAD status post CABG presents to the hospital secondary to worsening left shin wound. 4 days ago or so, he bumped into an umbrella stand and since then has been having left shin pain. Focal infections started, has seen his PCP and was started on Keflex as outpatient. Now he has superficial skin tear with exposed underlying subcutaneous tissues with purulent discharge. Worsening swelling of the shin and extensive pain of the whole leg. He complains of constant pain of his left leg moderate in intensity worsening with movement and it is a sharp pain, note the patient is anticoagulated on L [place] sizes last dose was this morning. He has also been on a regular diet. CT scan has personally been reviewed there is evidence of a collection and measuring 6 x 5 cm in size there is no evidence of necrotizing infection. There is no contrast and there is some limitations in this study. His kidneys also well functional with a normal creatinine and he has been well controlled on Prograf as his antirejection medication. He also has been started on broad-spectrum antibiotics including meropenem and daptomycin given his immunosuppressive state    .  Past Medical History Past Medical History  Diagnosis Date  . Hypertension   . Hyperlipidemia     low since renal failure  . Kidney transplant status, cadaveric 2012  . Depression   . GERD (gastroesophageal reflux disease)   . Diabetes mellitus   . Gout   . Asthma   . ESRD (end stage renal disease) ([place]) 2005   Dialysis then renal transplant  . CAD (coronary artery disease) 2005    had CABG  . CHF (congestive heart failure) ([place])   . Pneumonia   . Wears glasses   . Sleep apnea     bipap with oxygen 2 l  . Atrial fibrillation ([place])   . Trigger finger   . Amnestic MCI (mild cognitive impairment with memory loss)   . Dyspnea   . Benign neoplasm of colon   . Diverticulosis   . Chronic venous stasis dermatitis of both lower extremities   . Purpura ([place])   . Erectile dysfunction   . Obesity   . Atrial fibrillation ([place]) 2016     Past Surgical History  Procedure Laterality Date  . Kidney transplant  09/13/2010    cadaver--at [facility]  . Tympanic membrane repair  1/12    left  . Dg angio av shunt*l*      right and left upper arms  . Fasciotomy  03/03/2012    Procedure: FASCIOTOMY;  Surgeon: [name], MD;  Location: [facility];  Service: Orthopedics;  Laterality: Right;  FASCIOTOMY RIGHT SMALL FINGER  . Cardiac catheterization      [facility]  . Coronary artery bypass graft  2005    X 4  . Colonoscopy with propofol N/A 04/22/2013    Procedure: COLONOSCOPY WITH PROPOFOL;  Surgeon: [name], MD;  Location: [facility];  Service: Endoscopy;  Laterality: N/A;  . Fasciotomy Left 08/17/2013    Procedure: FASCIOTOMY LEFT RING;  Surgeon: Dillard Essex  Fredna Dow, MD;  Location: [facility];  Service: Orthopedics;  Laterality: Left;  . Bariatric surgery    . Vasectomy      Allergies  Allergen Reactions  . Morphine Itching    Current Facility-Administered Medications  Medication Dose Route Frequency Provider Last Rate Last Dose  . acetaminophen (TYLENOL) tablet 650 mg  650 mg Oral Q6H PRN Gladstone Lighter, MD       Or  . acetaminophen (TYLENOL) suppository 650 mg  650 mg Rectal Q6H PRN Gladstone Lighter, MD      . amLODipine (NORVASC) tablet 5 mg  5 mg Oral Daily Gladstone Lighter, MD   5 mg at 10/13/15 0957  . buPROPion [facility]) tablet 75 mg  75 mg Oral BID  Gladstone Lighter, MD   75 mg at 10/13/15 0957  . [START ON 10/14/2015] DAPTOmycin (CUBICIN) 480 mg in sodium chloride 0.9 % IVPB  480 mg Intravenous Q24H Crystal G Scarpena, RPH      . fluticasone (FLONASE) 50 MCG/ACT nasal spray 1 spray  1 spray Each Nare Daily Gladstone Lighter, MD   1 spray at 10/12/15 2032  . HYDROmorphone (DILAUDID) injection 1 mg  1 mg Intravenous Q4H PRN Gladstone Lighter, MD   1 mg at 10/13/15 1501  . insulin aspart (novoLOG) injection 0-5 Units  0-5 Units Subcutaneous QHS Gladstone Lighter, MD   0 Units at 10/12/15 2131  . insulin aspart (novoLOG) injection 0-9 Units  0-9 Units Subcutaneous TID WC Gladstone Lighter, MD   1 Units at 10/13/15 1222  . insulin glargine (LANTUS) injection 20 Units  20 Units Subcutaneous QHS Gladstone Lighter, MD   20 Units at 10/12/15 2302  . lisinopril (PRINIVIL,ZESTRIL) tablet 40 mg  40 mg Oral Daily Gladstone Lighter, MD   40 mg at 10/13/15 0957  . meropenem (MERREM) 1 g in sodium chloride 0.9 % 100 mL IVPB  1 g Intravenous Q8H Earleen Newport, MD 200 mL/hr at 10/13/15 0959 1 g at 10/13/15 0959  . multivitamin with minerals tablet 1 tablet  1 tablet Oral Daily Gladstone Lighter, MD   1 tablet at 10/13/15 1000  . mupirocin cream (BACTROBAN) 2 %   Topical Daily Henreitta Leber, MD      . oxyCODONE (Oxy IR/ROXICODONE) immediate release tablet 10 mg  10 mg Oral Q4H PRN Gladstone Lighter, MD   10 mg at 10/13/15 1232  . pantoprazole (PROTONIX) EC tablet 40 mg  40 mg Oral Daily Gladstone Lighter, MD   40 mg at 10/13/15 0957  . sulfamethoxazole-trimethoprim (BACTRIM DS,SEPTRA DS) 800-160 MG per tablet 1 tablet  1 tablet Oral Once per day on Mon Wed Fri Gladstone Lighter, MD   1 tablet at 10/13/15 0957  . tacrolimus (PROGRAF) capsule 0.5 mg  0.5 mg Oral QHS Gladstone Lighter, MD   0.5 mg at 10/12/15 2219  . tacrolimus (PROGRAF) capsule 1 mg  1 mg Oral q morning - 10a Gladstone Lighter, MD   1 mg at 10/13/15 0958  . valGANciclovir (VALCYTE) 450 MG  tablet TABS 900 mg  900 mg Oral BID Gladstone Lighter, MD   900 mg at 10/13/15 3151    Family History Family History  Problem Relation Age of Onset  . Heart disease Father   . Kidney failure Father   . Kidney disease Father   . Diabetes Maternal Grandmother   . Breast cancer Maternal Grandmother   . Liver cancer Paternal Uncle   . Liver cancer Paternal Grandmother   . Prostate  cancer Neg Hx      Social History Social History  Substance Use Topics  . Smoking status: Former Smoker    Types: Cigars    Quit date: 09/02/1994  . Smokeless tobacco: Never Used     Comment: cigar- once in a while  . Alcohol Use: 0.0 - 0.6 oz/week    0 Glasses of wine, 0-1 Standard drinks or equivalent per week     Comment: occasional- 3 in the past year     ROS 10 pt ROS is negative otherwise  Physical Exam Blood pressure 132/67, pulse 72, temperature 98.1 F (36.7 C), temperature source Oral, resp. rate 18, height _0  (1.778 m), weight 120.203 kg (265 lb), SpO2 94 %.  CONSTITUTIONAL: NAD. Non toxic EYES: Pupils equal, round, and reactive to light, Sclera non-icteric. EARS, NOSE, MOUTH AND THROAT: The oropharynx is clear. Oral mucosa is pink and moist. Hearing is intact to voice.  NECK: Trachea is midline, and there is no jugular venous distension. Thyroid is without palpable abnormalities. LYMPH NODES:  Lymph nodes in the neck are not enlarged. RESPIRATORY:  Lungs are clear, and breath sounds are equal bilaterally. Normal respiratory effort without pathologic use of accessory muscles. CARDIOVASCULAR: Heart is regular without murmurs, gallops, or rubs. GI: The abdomen is  soft, nontender, and nondistended. There were no palpable masses. There was no hepatosplenomegaly. There were normal bowel sounds. EXT: There is significant edema on the left lower leg. And there is fluctuance measuring about 4 x 2 cm in size and there is air superficial ulceration on the left leg with some granulation tissue  and some evidence of superinfected and tissue. There is no evidence of necrotizing fasciitis and there is no evidence of subcutaneous air. He is able to flex and extend his toes and there is no evidence of compartment syndrome of the leg Normal muscle strength and tone in all four extremities.   NEUROLOGIC:  Motor and sensation is grossly normal.  Cranial nerves are grossly intact. PSYCH:  Alert and oriented to person, place and time. Affect is normal.  Data Reviewed   I have personally reviewed the patient's imaging and medical records.    Assessment/Plan Infected hematoma of the left lower leg In an patient that is immunocompromised secondary to immunosuppression for a kidney transplant. At this point there is no evidence of necrotizing soft tissue infection or compartment syndrome. Since the patient is already anticoagulated and and a radiate I do think that the most prudent choice is to have this infected hematoma drained in the OR on Sunday morning. Discussed with the patient in detail about the procedure, risks, benefits and possible complications including but not limited to: Bleeding, infection, re-intervention, prolonged wound healing and pain. He understands and wishes to proceed. Discussed with primary team in detail about the planned. I have also discussed with him and given the option to be transferred to the tertiary facility where he was transplanted but he wishes to stay at [facility] at this time. I do think that discharge is reasonable but if he deteriorates or this becomes more complicated he may have to be transferred.  Caroleen Hamman, MD [place] 10/13/2015, 4:46 PM

## 2015-10-13 NOTE — Consult Note (Addendum)
Pharmacy Antibiotic Note  [name] is a 55 y.o. male admitted on 10/12/2015 with leg wound infection.  Pharmacy has been consulted for daptomycin and meropenem dosing.  Plan: Pt has a hx of kidney transplant. Dr. Jimmye Norman spoke with Dr. Johnnye Sima of ID who recommended daptomycin.  Meropenem 1g q 8 hours daptomycin 30m/kg q 24 hours. Will get a baseline CPK and monitor CPK weekly  Height: _0  (177.8 cm) Weight: 265 lb (120.203 kg) IBW/kg (Calculated) : 73  Temp (24hrs), Avg:98.1 F (36.7 C), Min:97.8 F (36.6 C), Max:98.3 F (36.8 C)   Recent Labs Lab 10/12/15 1642 10/13/15 0453  WBC 6.6 7.4  CREATININE 0.85 0.80    Estimated Creatinine Clearance: 134 mL/min (by C-G formula based on Cr of 0.8).    Allergies  Allergen Reactions  . Morphine Itching    Antimicrobials this admission: daptomycin 7/20 >>  meropenem 7/20 >>   Dose adjustments this admission:   Microbiology results:   Thank you for allowing pharmacy to be a part of this patient's care.  CMurrell Converse PharmD Clinical Pharmacist 10/13/2015

## 2015-10-13 NOTE — Consult Note (Signed)
[facility] Nurse wound consult note Reason for Consult: Trauma would to left anterior pretibial leg.  Cellulitis Wound type:Infectious Pressure Ulcer POA: N/A Measurement:10.5 cm x 3 cm x 0.2 cm  Wound OMA:YOKHT red, indurated Drainage (amount, consistency, odor) Minimal serosanguinous  Musty odor Periwound:Erythema and edema  Induration to periwound Dressing procedure/placement/frequency:Cleanse wound to left lower leg with NS and pat gently dry.  Apply Bactroban to wound bed.  Cover with Mepitel silicone contact layer.  Apply 4x4 gauze, kerlix and tape.  Change daily.  Will not follow at this time.  Please re-consult if needed.  Domenic Moras RN BSN [place] Pager [phone]

## 2015-10-13 NOTE — Progress Notes (Signed)
°   10/13/15 0800  Clinical Encounter Type  Visited With Patient  Visit Type Initial  Referral From Nurse;Patient  Consult/Referral To Chaplain  Recommendations Follow-up  Spiritual Encounters  Spiritual Needs Prayer  Stress Factors  Patient Stress Factors Health changes;Financial concerns  Family Stress Factors Not reviewed  Advance Directives (For Healthcare)  Does patient have an advance directive? Yes  Patient is in good spirits, anxious to get health issues resolved.  Expressed concern that present condition might aggravate an existing condition, but hopeful nonetheless.

## 2015-10-14 DIAGNOSIS — L0390 Cellulitis, unspecified: Secondary | ICD-10-CM

## 2015-10-14 DIAGNOSIS — L0291 Cutaneous abscess, unspecified: Secondary | ICD-10-CM | POA: Insufficient documentation

## 2015-10-14 LAB — GLUCOSE, CAPILLARY
GLUCOSE-CAPILLARY: 150 mg/dL — AB (ref 65–99)
GLUCOSE-CAPILLARY: 157 mg/dL — AB (ref 65–99)
Glucose-Capillary: 136 mg/dL — ABNORMAL HIGH (ref 65–99)
Glucose-Capillary: 92 mg/dL (ref 65–99)

## 2015-10-14 LAB — CBC
HEMATOCRIT: 34 % — AB (ref 40.0–52.0)
HEMOGLOBIN: 11.9 g/dL — AB (ref 13.0–18.0)
MCH: 32.1 pg (ref 26.0–34.0)
MCHC: 35 g/dL (ref 32.0–36.0)
MCV: 91.7 fL (ref 80.0–100.0)
Platelets: 164 10*3/uL (ref 150–440)
RBC: 3.71 MIL/uL — ABNORMAL LOW (ref 4.40–5.90)
RDW: 17.3 % — ABNORMAL HIGH (ref 11.5–14.5)
WBC: 6.1 10*3/uL (ref 3.8–10.6)

## 2015-10-14 LAB — MRSA PCR SCREENING: MRSA by PCR: NEGATIVE

## 2015-10-14 MED ORDER — CHLORHEXIDINE GLUCONATE CLOTH 2 % EX PADS: 6.0000 | MEDICATED_PAD | Freq: Once | CUTANEOUS | Status: DC

## 2015-10-14 MED ORDER — INSULIN GLARGINE 100 UNIT/ML ~~LOC~~ SOLN
10.0000 [IU] | Freq: Every day | SUBCUTANEOUS | Status: DC
  Administered 2015-10-14 – 2015-10-17 (×4): 10 [IU] via SUBCUTANEOUS
  Filled 2015-10-14 (×5): qty 0.1

## 2015-10-14 MED ORDER — CHLORHEXIDINE GLUCONATE CLOTH 2 % EX PADS
6.0000 | MEDICATED_PAD | Freq: Once | CUTANEOUS | Status: AC
  Administered 2015-10-14: 6 via TOPICAL

## 2015-10-14 NOTE — Progress Notes (Signed)
Infected hematoma on the left leg Afebrile, vss Still having significant leg pain.  PE : NAD , non toxic Ext: Left leg no evidence of compartment syndrome well-perfused. There is the infected hematoma on the left leg. No evidence of necrotizing infection   A/P infected left leg hematoma for I&D tomorrow. D with the patient in detail, no questions at this time

## 2015-10-14 NOTE — Progress Notes (Signed)
[facility] [place] NAME: [name]    MR#:  [id number]  DATE OF BIRTH:  08/15/1960  SUBJECTIVE:  complains of left  Leg  pain and waiting for drainage of hematoma by surgery tomorrow. No fever.    Pt. Here due to left lower ext. Wound.  Still having significant pain in the left lower extremity wound. Afebrile, minimal drainage from the wound.  REVIEW OF SYSTEMS:    Review of Systems  Constitutional: Negative for fever and chills.  HENT: Negative for congestion and tinnitus.   Eyes: Negative for blurred vision and double vision.  Respiratory: Negative for cough, shortness of breath and wheezing.   Cardiovascular: Negative for chest pain, orthopnea and PND.  Gastrointestinal: Negative for nausea, vomiting, abdominal pain and diarrhea.  Genitourinary: Negative for dysuria and hematuria.  Neurological: Negative for dizziness, sensory change and focal weakness.  All other systems reviewed and are negative. Left leg  redness, swelling, pain  Nutrition: Heart/Carb modified. Tolerating Diet: Yes Tolerating PT: Await Eval.    DRUG ALLERGIES:   Allergies  Allergen Reactions  . Morphine Itching    VITALS:  Blood pressure 132/80, pulse 89, temperature 98.4 F (36.9 C), temperature source Oral, resp. rate 16, height _0  (1.778 m), weight 120.203 kg (265 lb), SpO2 92 %.  PHYSICAL EXAMINATION:   Physical Exam  GENERAL:  55 y.o.-year-old patient lying in the bed in no acute distress.  EYES: Pupils equal, round, reactive to light and accommodation. No scleral icterus. Extraocular muscles intact.  HEENT: Head atraumatic, normocephalic. Oropharynx and nasopharynx clear.  NECK:  Supple, no jugular venous distention. No thyroid enlargement, no tenderness.  LUNGS: Normal breath sounds bilaterally, no wheezing, rales, rhonchi. No use of accessory muscles of respiration.  CARDIOVASCULAR: S1, S2 normal. No murmurs, rubs, or gallops.  ABDOMEN:  Soft, nontender, nondistended. Bowel sounds present. No organomegaly or mass.  EXTREMITIES: No cyanosis, clubbing or edema b/l.  Left lower extremity wound. Small tense area which appears to be hematoma from trauma. Slight whitish discharge from the distal aspect of the wound.    NEUROLOGIC: Cranial nerves II through XII are intact. No focal Motor or sensory deficits b/l.   PSYCHIATRIC: The patient is alert and oriented x 3.  SKIN: No obvious rash, lesion, or ulcer.    LABORATORY PANEL:   CBC  Recent Labs Lab 10/14/15 0521  WBC 6.1  HGB 11.9*  HCT 34.0*  PLT 164   ------------------------------------------------------------------------------------------------------------------  Chemistries   Recent Labs Lab 10/13/15 0453  NA 138  K 3.4*  CL 103  CO2 31  GLUCOSE 106*  BUN 15  CREATININE 0.80  CALCIUM 8.8*   ------------------------------------------------------------------------------------------------------------------  Cardiac Enzymes No results for input(s): TROPONINI in the last 168 hours. ------------------------------------------------------------------------------------------------------------------  RADIOLOGY:  Dg Tibia/fibula Left  10/12/2015  CLINICAL DATA:  Left lower leg trauma, injury, superficial cellulitis. EXAM: LEFT TIBIA AND FIBULA - 2 VIEW COMPARISON:  None available FINDINGS: Peripheral atherosclerosis noted. Mild diffuse soft tissue swelling. Left tibia and fibula appear intact. Focal mid anterior shin swelling noted. No underlying radiopaque foreign body. Negative for fracture. No acute osseous finding by plain radiography. IMPRESSION: Soft tissue swelling, more focally in the mid anterior shin region. Peripheral atherosclerosis No acute osseous finding. Electronically Signed   By: Jerilynn Mages.  Shick M.D.   On: 10/12/2015 17:34   Ct Tibia Fibula Left Wo Contrast  10/13/2015  CLINICAL DATA:  Right lower leg pain and swelling for 1 week  after striking a table.  EXAM: CT TIBIA FIBULA LEFT WITHOUT CONTRAST TECHNIQUE: Multidetector CT imaging was performed according to the standard protocol. Multiplanar CT image reconstructions were also generated. COMPARISON:  10/12/2015 FINDINGS: Vascular calcifications noted. Subcutaneous edema along the anterior calf. Along the superficial fascia margin of the anterior compartment just distal to the midshaft of the tibia, there is a 4.5 by 2.2 by 6.2 cm (volume = 31.9 cc), high density lesion compatible with hematoma. No foreign body identified.  Small knee effusion. IMPRESSION: 1. Approximately 32 cc hematoma along the superficial fascia margin of the anterior compartmental musculature just distal to the midshaft of the tibia. This could be a bland subcutaneous hematoma but if the lesion fails to resolve with conservative therapy, the possibility of a superficial fascia all shearing injury similar to a [employer] lesion cannot be completely excluded. 2. Subcutaneous edema tracking along the anterior calf. 3. No foreign body or fracture identified. 4. Atherosclerotic calcification. Electronically Signed   By: Van Clines M.D.   On: 10/13/2015 14:46     ASSESSMENT AND PLAN:   [name] is a 55 y.o. male with a known history of Hypertension, insulin-dependent diabetes mellitus, obstructive sleep apnea on CPAP, chronic atrial fibrillation, history of end-stage renal disease now status post renal transplant and normal kidney function, history of CAD status post CABG presents to the hospital secondary to worsening left shin wound.  #1 left leg Hematoma: Scheduled for I&D tomorrow by surgery, hold  ELiquis.-Continue IV daptomycin, meropenem. Cultures so far negative. --Continue supportive care as mentioned above.  #2 hypertension-continue lisinopril and Norvasc  #3 hypokalemia- improved w/ supplementation   #4 history of end-stage renal disease status post renal transplant-currently on Prograf and  Valcyte -Continue Bactrim for PCP prophylaxis. - cont. To hold diuretics for now.    #5 diabetes mellitus-continue Lantus and Novolog SS and will monitor. Use half dose Lantus tonight because he'll have surgery tomorrow and he'll be nothing by mouth after midnight.  #6. Hx of chronic a. Fib - rate controlled.    - cont. Eliquis.     All the records are reviewed and case discussed with Care Management/Social Workerr. Management plans discussed with the patient, family and they are in agreement.  CODE STATUS: Full  DVT Prophylaxis: Eliquis  TOTAL TIME TAKING CARE OF THIS PATIENT: 30 minutes.   POSSIBLE D/C IN 2-3 DAYS, DEPENDING ON CLINICAL CONDITION.   Epifanio Lesches M.D on 10/14/2015 at 8:38 AM  Between 7am to 6pm - Pager - [phone]  After 6pm go to [email or web address] - password [email or web address] [facility]  Office  [phone]  CC: Primary care physician; [name], MD

## 2015-10-14 NOTE — Care Management Important Message (Signed)
Important Message  Patient Details  Name: COHAN STIPES MRN: [id number] Date of Birth: April 19, 1960   Medicare Important Message Given:  Yes    Carles Collet, RN 10/14/2015, 12:08 [place] Message  Patient Details  Name: LERON STOFFERS MRN: [id number] Date of Birth: 05/01/1960   Medicare Important Message Given:  Yes    Carles Collet, RN 10/14/2015, 12:07 PM

## 2015-10-14 NOTE — Care Management Note (Addendum)
Case Management Note  Patient Details  Name: OPIE MACLAUGHLIN MRN: [id number] Date of Birth: Jul 31, 1960  Subjective/Objective:      55yo Mr Barkley Kratochvil was admitted 10/12/15 per left leg cellulitis. He resides at home with his wife who can assist with transportation to appointments if needed. PCP=Dr Letvek. Pharmacy=through [facility]. Wife is a Astronomer and he is on her insurance as well as Medicare.. No home oxygen. Assistive equipment is a cane only. No home health services. Mr Redmon reports that he is having I&D of his left leg tomorrow. Do home health needs anticipated but Case management will follow for discharge planning.               Action/Plan:   Expected Discharge Date:                  Expected Discharge Plan:     In-House Referral:     Discharge planning Services     Post Acute Care Choice:    Choice offered to:     DME Arranged:    DME Agency:     HH Arranged:    HH Agency:     Status of Service:     If discussed at [employer] of Stay Meetings, dates discussed:    Additional Comments:  Jash Wahlen A, RN 10/14/2015, 2:51 PM

## 2015-10-15 ENCOUNTER — Inpatient Hospital Stay: Payer: 59 | Admitting: Anesthesiology

## 2015-10-15 ENCOUNTER — Encounter: Payer: Self-pay | Admitting: Anesthesiology

## 2015-10-15 ENCOUNTER — Encounter
Admission: EM | Disposition: A | Discharge status: Home / Self Care | Payer: Self-pay | Source: Home / Self Care | Attending: Internal Medicine

## 2015-10-15 HISTORY — PX: INCISION AND DRAINAGE ABSCESS: SHX5864

## 2015-10-15 LAB — GLUCOSE, CAPILLARY
GLUCOSE-CAPILLARY: 99 mg/dL (ref 65–99)
GLUCOSE-CAPILLARY: 134 mg/dL — AB (ref 65–99)
GLUCOSE-CAPILLARY: 154 mg/dL — AB (ref 65–99)

## 2015-10-15 SURGERY — INCISION AND DRAINAGE, ABSCESS
Anesthesia: General | Laterality: Left

## 2015-10-15 MED ORDER — MIDAZOLAM HCL 5 MG/5ML IJ SOLN
INTRAMUSCULAR | Status: DC | PRN
  Administered 2015-10-15: 2 mg via INTRAVENOUS

## 2015-10-15 MED ORDER — FENTANYL CITRATE (PF) 100 MCG/2ML IJ SOLN
25.0000 ug | INTRAMUSCULAR | Status: DC | PRN
  Administered 2015-10-15 (×4): 25 ug via INTRAVENOUS

## 2015-10-15 MED ORDER — PROPOFOL 10 MG/ML IV BOLUS
INTRAVENOUS | Status: DC | PRN
  Administered 2015-10-15: 200 mg via INTRAVENOUS

## 2015-10-15 MED ORDER — LACTATED RINGERS IV SOLN
INTRAVENOUS | Status: DC | PRN
  Administered 2015-10-15: 08:00:00 via INTRAVENOUS

## 2015-10-15 MED ORDER — FENTANYL CITRATE (PF) 100 MCG/2ML IJ SOLN
INTRAMUSCULAR | Status: DC | PRN
  Administered 2015-10-15 (×2): 50 ug via INTRAVENOUS

## 2015-10-15 MED ORDER — ONDANSETRON HCL 4 MG/2ML IJ SOLN
INTRAMUSCULAR | Status: DC | PRN
  Administered 2015-10-15: 4 mg via INTRAVENOUS

## 2015-10-15 MED ORDER — LIDOCAINE HCL (CARDIAC) 20 MG/ML IV SOLN
INTRAVENOUS | Status: DC | PRN
  Administered 2015-10-15: 30 mg via INTRAVENOUS

## 2015-10-15 MED ORDER — ONDANSETRON HCL 4 MG/2ML IJ SOLN: 4.0000 mg | Freq: Once | INTRAMUSCULAR | Status: DC | PRN

## 2015-10-15 SURGICAL SUPPLY — 26 items
BLADE CLIPPER SURG (BLADE) IMPLANT
BLADE SURG 15 STRL LF DISP TIS (BLADE) ×1 IMPLANT
BLADE SURG 15 STRL SS (BLADE) ×2
BNDG COHESIVE 6X5 TAN STRL LF (GAUZE/BANDAGES/DRESSINGS) ×2 IMPLANT
BNDG GAUZE 1X2.1 STRL (MISCELLANEOUS) ×2 IMPLANT
BNDG GAUZE 4.5X4.1 6PLY STRL (MISCELLANEOUS) ×2 IMPLANT
CANISTER SUCT 3000ML (MISCELLANEOUS) ×2 IMPLANT
CHLORAPREP W/TINT 26ML (MISCELLANEOUS) ×2 IMPLANT
DRAPE EXTREMITY 106X87X128.5 (DRAPES) ×2 IMPLANT
DRAPE LAPAROTOMY 100X77 ABD (DRAPES) ×2 IMPLANT
DRESSING TELFA 4X3 1S ST N-ADH (GAUZE/BANDAGES/DRESSINGS) ×2 IMPLANT
ELECT REM PT RETURN 9FT ADLT (ELECTROSURGICAL) ×2
ELECTRODE REM PT RTRN 9FT ADLT (ELECTROSURGICAL) ×1 IMPLANT
GAUZE XEROFORM 4X4 STRL (GAUZE/BANDAGES/DRESSINGS) ×2 IMPLANT
GLOVE BIO SURGEON STRL SZ7 (GLOVE) ×6 IMPLANT
GOWN STRL REUS W/ TWL LRG LVL3 (GOWN DISPOSABLE) ×2 IMPLANT
GOWN STRL REUS W/TWL LRG LVL3 (GOWN DISPOSABLE) ×4
NEEDLE HYPO 22GX1.5 SAFETY (NEEDLE) ×2 IMPLANT
NS IRRIG 1000ML POUR BTL (IV SOLUTION) ×2 IMPLANT
PACK BASIN MINOR ARMC (MISCELLANEOUS) ×2 IMPLANT
SCRUB POVIDONE IODINE 4 OZ (MISCELLANEOUS) IMPLANT
SOL PREP PVP 2OZ (MISCELLANEOUS)
SOLUTION PREP PVP 2OZ (MISCELLANEOUS) IMPLANT
STOCKINETTE STRL 4IN 9604848 (GAUZE/BANDAGES/DRESSINGS) ×2 IMPLANT
SWAB DUAL CULTURE TRANS RED ST (MISCELLANEOUS) ×2 IMPLANT
SYR BULB IRRIG 60ML STRL (SYRINGE) ×2 IMPLANT

## 2015-10-15 NOTE — Anesthesia Preprocedure Evaluation (Signed)
Anesthesia Evaluation  Patient identified by MRN, date of birth, ID band Patient awake    Reviewed: Allergy & Precautions, NPO status , Patient's Chart, lab work & pertinent test results, reviewed documented beta blocker date and time   Airway Mallampati: III  TM Distance: >3 FB     Dental  (+) Chipped   Pulmonary shortness of breath, asthma , sleep apnea , former smoker,           Cardiovascular hypertension, + CAD, + CABG and +CHF       Neuro/Psych PSYCHIATRIC DISORDERS Depression    GI/Hepatic GERD  Controlled,  Endo/Other  diabetes, Type 2  Renal/GU Renal disease     Musculoskeletal   Abdominal   Peds  Hematology   Anesthesia Other Findings   Reproductive/Obstetrics                             Anesthesia Physical Anesthesia Plan  ASA: III  Anesthesia Plan: General   Post-op Pain Management:    Induction: Intravenous  Airway Management Planned: LMA  Additional Equipment:   Intra-op Plan:   Post-operative Plan:   Informed Consent: I have reviewed the patients History and Physical, chart, labs and discussed the procedure including the risks, benefits and alternatives for the proposed anesthesia with the patient or authorized representative who has indicated his/her understanding and acceptance.     Plan Discussed with: CRNA  Anesthesia Plan Comments: (He has had a kidney transplant.)        Anesthesia Quick Evaluation

## 2015-10-15 NOTE — Anesthesia Procedure Notes (Signed)
Procedure Name: LMA Insertion Date/Time: 10/15/2015 8:04 AM Performed by: Eliberto Ivory Pre-anesthesia Checklist: Patient identified, Patient being monitored, Timeout performed, Emergency Drugs available and Suction available Patient Re-evaluated:Patient Re-evaluated prior to inductionOxygen Delivery Method: Circle system utilized Preoxygenation: Pre-oxygenation with 100% oxygen Intubation Type: IV induction Ventilation: Mask ventilation without difficulty LMA: LMA inserted LMA Size: 4.0 Grade View: Grade III Tube type: Oral Number of attempts: 1 Placement Confirmation: positive ETCO2 and breath sounds checked- equal and bilateral Tube secured with: Tape Dental Injury: Teeth and Oropharynx as per pre-operative assessment

## 2015-10-15 NOTE — Op Note (Signed)
  10/12/2015 - 10/15/2015  8:34 AM  PATIENT:  [name]  55 y.o. male  PRE-OPERATIVE DIAGNOSIS:  Infected and symptomatic Left leg hematoma with superficial wound measuring 12x 2 cms  POST-OPERATIVE DIAGNOSIS:  Same  PROCEDURE:  1. Incision and drainage of complex hematoma                           2. Debridement of skin subcutaneous tissue 12x2 cm = 24 cm 2    SURGEON:  Surgeon(s) and Role:    * Diego F Pabon, MD - Primary    ANESTHESIA: GETA  INDICATIONS FOR PROCEDURE Infected Hematoma  DICTATION:  Patient was playing about proceeding detail, risk benefits possible complications and a consent was obtained. The patient taken to the operating room and placed in the prone position. Longitudinal elliptical incisions created w 15 blade knife.  There was complex luculations that we were able to lyse with a combination of finger fracture and suction device, the hematoma was drained and we obtained cultures.  Using a sharp curette we debrided the sub q tissue and skin  Hemostasis was obtained with electrocautery. Irrigation with normal saline and the wound was packed with inch packing.  Vaseline gauze placed on top of debrided skin and Kerlix wrap and coban applied for compression.  Needle and laparotomy counts were correct and there were no immediate complications. May resume Eloquis tomorrow  [name], MD

## 2015-10-15 NOTE — Progress Notes (Signed)
[name]    MR#:  [id number]  DATE OF BIRTH:  Aug 29, 1960  SUBJECTIVE:  Hematoma is drained  this morning.    Pt. Here due to left lower ext. Wound.  REVIEW OF SYSTEMS:    Review of Systems  Constitutional: Negative for chills and fever.  HENT: Negative for congestion, ear discharge and hearing loss.   Eyes: Negative for blurred vision, double vision, photophobia and pain.  Respiratory: Negative for cough and hemoptysis.   Cardiovascular: Negative for chest pain and palpitations.  Gastrointestinal: Negative for abdominal pain, blood in stool, constipation, diarrhea, heartburn, melena, nausea and vomiting.  Genitourinary: Negative for dysuria, frequency and urgency.  Musculoskeletal: Negative for falls, joint pain, myalgias and neck pain.  Neurological: Negative for dizziness, tingling, tremors, seizures and headaches.  Endo/Heme/Allergies: Negative for environmental allergies. Does not bruise/bleed easily.  Psychiatric/Behavioral: Negative for depression, hallucinations and suicidal ideas. The patient does not have insomnia.   All other systems reviewed and are negative. Left leg  redness, swelling, pain s/p drainage.  Nutrition: Heart/Carb modified. Tolerating Diet: Yes Tolerating PT: Await Eval.    DRUG ALLERGIES:   Allergies  Allergen Reactions  . Morphine Itching    VITALS:  Blood pressure 128/77, pulse 73, temperature 97.7 F (36.5 C), temperature source Oral, resp. rate 20, height _0  (1.778 m), weight 120.2 kg (265 lb), SpO2 100 %.  PHYSICAL EXAMINATION:   Physical Exam  GENERAL:  55 y.o.-year-old patient lying in the bed in no acute distress.  EYES: Pupils equal, round, reactive to light and accommodation. No scleral icterus. Extraocular muscles intact.  HEENT: Head atraumatic, normocephalic. Oropharynx and nasopharynx clear.  NECK:  Supple, no jugular venous distention. No thyroid  enlargement, no tenderness.  LUNGS: Normal breath sounds bilaterally, no wheezing, rales, rhonchi. No use of accessory muscles of respiration.  CARDIOVASCULAR: S1, S2 normal. No murmurs, rubs, or gallops.  ABDOMEN: Soft, nontender, nondistended. Bowel sounds present. No organomegaly or mass.  EXTREMITIES: No cyanosis, clubbing or edema. Left leg dressing present.  NEUROLOGIC: Cranial nerves II through XII are intact. No focal Motor or sensory deficits b/l.   PSYCHIATRIC: The patient is alert and oriented x 3.  SKIN: No obvious rash, lesion, or ulcer.    LABORATORY PANEL:   CBC  Recent Labs Lab 10/14/15 0521  WBC 6.1  HGB 11.9*  HCT 34.0*  PLT 164   ------------------------------------------------------------------------------------------------------------------  Chemistries   Recent Labs Lab 10/13/15 0453  NA 138  K 3.4*  CL 103  CO2 31  GLUCOSE 106*  BUN 15  CREATININE 0.80  CALCIUM 8.8*   ------------------------------------------------------------------------------------------------------------------  Cardiac Enzymes No results for input(s): TROPONINI in the last 168 hours. ------------------------------------------------------------------------------------------------------------------  RADIOLOGY:  Ct Tibia Fibula Left Wo Contrast  Result Date: 10/13/2015 CLINICAL DATA:  Right lower leg pain and swelling for 1 week after striking a table. EXAM: CT TIBIA FIBULA LEFT WITHOUT CONTRAST TECHNIQUE: Multidetector CT imaging was performed according to the standard protocol. Multiplanar CT image reconstructions were also generated. COMPARISON:  10/12/2015 FINDINGS: Vascular calcifications noted. Subcutaneous edema along the anterior calf. Along the superficial fascia margin of the anterior compartment just distal to the midshaft of the tibia, there is a 4.5 by 2.2 by 6.2 cm (volume = 31.9 cc), high density lesion compatible with hematoma. No foreign body identified.  Small  knee effusion. IMPRESSION: 1. Approximately 32 cc hematoma along the superficial fascia margin of the anterior compartmental musculature  just distal to the midshaft of the tibia. This could be a bland subcutaneous hematoma but if the lesion fails to resolve with conservative therapy, the possibility of a superficial fascia all shearing injury similar to a [name] lesion cannot be completely excluded. 2. Subcutaneous edema tracking along the anterior calf. 3. No foreign body or fracture identified. 4. Atherosclerotic calcification. Electronically Signed   By: Van Clines M.D.   On: 10/13/2015 14:46     ASSESSMENT AND PLAN:   [name] is a 55 y.o. male with a known history of Hypertension, insulin-dependent diabetes mellitus, obstructive sleep apnea on CPAP, chronic atrial fibrillation, history of end-stage renal disease now status post renal transplant and normal kidney function, history of CAD status post CABG presents to the hospital secondary to worsening left shin wound.  #1 left leg Hematoma: Status post incision and drainage by surgery today. Tolerated the procedure well. Continue IV vancomycin, meropenem today also. Watch overnight today to make sure he feels better and likely discharge tomorrow. With po abx.--Continue supportive care as mentioned above.  #2 hypertension-continue lisinopril and Norvasc  #3 hypokalemia- improved w/ supplementation   #4 history of end-stage renal disease status post renal transplant-currently on Prograf and Valcyte -Continue Bactrim for PCP prophylaxis. - cont. To hold diuretics for now.    #5 diabetes mellitus-continue Lantus and Novolog SS and will monitor.   #6. Hx of chronic a. Fib - rate controlled.    Can be started back on eliquis  Tomorrow as per surgery, D/w wife  All the records are reviewed and case discussed with Care Management/Social Workerr. Management plans discussed with the patient, family and they are in  agreement.  CODE STATUS: Full  DVT Prophylaxis: Eliquis  TOTAL TIME TAKING CARE OF THIS PATIENT: 30 minutes.   POSSIBLE D/C IN 2-3 DAYS, DEPENDING ON CLINICAL CONDITION.   Epifanio Lesches M.D on 10/15/2015 at 10:38 AM  Between 7am to 6pm - Pager - [phone]  After 6pm go to [email or web address] - password [email or web address] [facility]  Office  [phone]  CC: Primary care physician; [name], MD

## 2015-10-15 NOTE — Progress Notes (Signed)
Received report and picked up patient from Verne Carrow., RN 10/15/15 1400

## 2015-10-15 NOTE — Progress Notes (Signed)
Preoperative Review   Patient is met in the preoperative holding area. The history is reviewed in the chart and with the patient. I personally reviewed the options and rationale as well as the risks of this procedure that have been previously discussed with the patient. All questions asked by the patient and/or family were answered to their satisfaction.  Patient agrees to proceed with this procedure at this time.  Jazlyne Gauger M.D. FACS   

## 2015-10-15 NOTE — Transfer of Care (Signed)
Immediate Anesthesia Transfer of Care Note  Patient: [name]  Procedure(s) Performed: Procedure(s): INCISION AND DRAINAGE ABSCESS (Left)  Patient Location: PACU  Anesthesia Type:General  Level of Consciousness: Alert, Awake, Oriented  Airway & Oxygen Therapy: Patient Spontanous Breathing  Post-op Assessment: Report given to RN  Post vital signs: Reviewed and stable  Last Vitals:  Vitals:   10/15/15 0555 10/15/15 0842  BP: (!) 152/80 (!) 146/66  Pulse: 69 90  Resp: 18 16  Temp: 36.5 C 07.6 C    Complications: No apparent anesthesia complications

## 2015-10-15 NOTE — Anesthesia Postprocedure Evaluation (Signed)
Anesthesia Post Note  Patient: [name]  Procedure(s) Performed: Procedure(s) (LRB): INCISION AND DRAINAGE ABSCESS (Left)  Patient location during evaluation: PACU Anesthesia Type: General Level of consciousness: awake and alert Pain management: pain level controlled Vital Signs Assessment: post-procedure vital signs reviewed and stable Respiratory status: spontaneous breathing, nonlabored ventilation, respiratory function stable and patient connected to nasal cannula oxygen Cardiovascular status: blood pressure returned to baseline and stable Postop Assessment: no signs of nausea or vomiting Anesthetic complications: no    Last Vitals:  Vitals:   10/15/15 1015 10/15/15 1200  BP: 128/77 (!) 129/59  Pulse: 73 78  Resp:  18  Temp: 36.5 C 36.4 C    Last Pain:  Vitals:   10/15/15 1200  TempSrc: Oral  PainSc:                  Ermal Brzozowski S

## 2015-10-16 ENCOUNTER — Encounter: Payer: Self-pay | Admitting: Surgery

## 2015-10-16 LAB — GLUCOSE, CAPILLARY
GLUCOSE-CAPILLARY: 106 mg/dL — AB (ref 65–99)
GLUCOSE-CAPILLARY: 135 mg/dL — AB (ref 65–99)
Glucose-Capillary: 127 mg/dL — ABNORMAL HIGH (ref 65–99)
Glucose-Capillary: 164 mg/dL — ABNORMAL HIGH (ref 65–99)

## 2015-10-16 MED ORDER — CEFTRIAXONE SODIUM 1 G IJ SOLR
1.0000 g | Freq: Once | INTRAMUSCULAR | Status: DC
  Administered 2015-10-10: 1 g via INTRAMUSCULAR

## 2015-10-16 MED ORDER — APIXABAN 2.5 MG PO TABS
5.0000 mg | ORAL_TABLET | Freq: Two times a day (BID) | ORAL | Status: DC
  Administered 2015-10-16 – 2015-10-18 (×5): 5 mg via ORAL
  Filled 2015-10-16 (×6): qty 2

## 2015-10-16 NOTE — Addendum Note (Signed)
Addended by: Lurlean Nanny on: 10/16/2015 09:20 AM   Modules accepted: Orders

## 2015-10-16 NOTE — Progress Notes (Signed)
55 yr old male POD#1 from Left lower extremity hematoma incision and drainage.  Patient states the pain is much improved.  He was wondering if he could walk on the foot.  He has not tried since the operation.  He denies any fever, chills, increased pain or drainage.    Vitals:   10/16/15 0604 10/16/15 1420  BP: (!) 142/79 (!) 126/50  Pulse: 76 74  Resp: 20 18  Temp: 98.1 F (36.7 C) 98.7 F (37.1 C)   I/O last 3 completed shifts: In: 1160 [P.O.:360; I.V.:300; Other:400; IV Piggyback:100] Out: 1954 [XFGHW:2993; Blood:4] Total I/O In: 240 [P.O.:240] Out: 600 [Urine:600]   PE: Gen: NAD Res: CTAB/L  Cardio: RRR ZJI:RCVE,LF LLE: cavity with good granulation material, no erythema, repacked with iodoform gauze, some denuded skin to anterior tibia, minimal swelling to ankle and tenderness but no frank erythema or fluctuance.    A/P:  55 yr old male POD#1 from Left lower extremity hematoma incision and drainage  Wound healing, no erythema but some edema of ankle, will need to monitor for signs of increasing infection, if fluctuant or erythema progresses may need to incised at ankle.  Will continue to monitor, continue dapto and merrem.

## 2015-10-16 NOTE — Progress Notes (Signed)
[facility] [place] NAME: [name]    MR#:  [id number]  DATE OF BIRTH:  06-23-1960  SUBJECTIVE:  POD #1 s/p evacuation of hematoma Mild left ankle swelling and redness No fever  REVIEW OF SYSTEMS:   Review of Systems  Constitutional: Negative for chills, fever and weight loss.  HENT: Negative for ear discharge, ear pain and nosebleeds.   Eyes: Negative for blurred vision, pain and discharge.  Respiratory: Negative for sputum production, shortness of breath, wheezing and stridor.   Cardiovascular: Negative for chest pain, palpitations, orthopnea and PND.  Gastrointestinal: Negative for abdominal pain, diarrhea, nausea and vomiting.  Genitourinary: Negative for frequency and urgency.  Musculoskeletal: Negative for back pain and joint pain.  Neurological: Positive for weakness. Negative for sensory change, speech change and focal weakness.  Psychiatric/Behavioral: Negative for depression and hallucinations. The patient is not nervous/anxious.    Tolerating Diet:yes Tolerating PT: pending  DRUG ALLERGIES:   Allergies  Allergen Reactions  . Morphine Itching    VITALS:  Blood pressure (!) 142/79, pulse 76, temperature 98.1 F (36.7 C), temperature source Oral, resp. rate 20, height _0  (1.778 m), weight 120.2 kg (265 lb), SpO2 92 %.  PHYSICAL EXAMINATION:   Physical Exam  GENERAL:  55 y.o.-year-old patient lying in the bed with no acute distress.  EYES: Pupils equal, round, reactive to light and accommodation. No scleral icterus. Extraocular muscles intact.  HEENT: Head atraumatic, normocephalic. Oropharynx and nasopharynx clear.  NECK:  Supple, no jugular venous distention. No thyroid enlargement, no tenderness.  LUNGS: Normal breath sounds bilaterally, no wheezing, rales, rhonchi. No use of accessory muscles of respiration.  CARDIOVASCULAR: S1, S2 normal. No murmurs, rubs, or gallops.  ABDOMEN: Soft, nontender,  nondistended. Bowel sounds present. No organomegaly or mass.  EXTREMITIES: No cyanosis, clubbing or edema b/l.   Surgical dressing over the left leg NEUROLOGIC: Cranial nerves II through XII are intact. No focal Motor or sensory deficits b/l.   PSYCHIATRIC:  patient is alert and oriented x 3.  SKIN: No obvious rash, lesion, or ulcer.   LABORATORY PANEL:  CBC  Recent Labs Lab 10/14/15 0521  WBC 6.1  HGB 11.9*  HCT 34.0*  PLT 164    Chemistries   Recent Labs Lab 10/13/15 0453  NA 138  K 3.4*  CL 103  CO2 31  GLUCOSE 106*  BUN 15  CREATININE 0.80  CALCIUM 8.8*   Cardiac Enzymes No results for input(s): TROPONINI in the last 168 hours. RADIOLOGY:  No results found. ASSESSMENT AND PLAN:  [name] is a 55 y.o. male with a known history of Hypertension, insulin-dependent diabetes mellitus, obstructive sleep apnea on CPAP, chronic atrial fibrillation, history of end-stage renal disease now status post renal transplant and normal kidney function, history of CAD status post CABG presents to the hospital secondary to worsening left shin wound.  #1 left leg Hematoma: Status post incision and drainage by surgery POD #1 -Tolerated the procedure well.  -Continue IV daptomycin and  meropenem t -WC negative todate -BC negative -WBC normal.  -will curbside ID for need of abxs at d/c  #2 hypertension-continue lisinopril and Norvasc  #3 hypokalemia- improved w/ supplementation   #4 history of end-stage renal disease status post renal transplant-currently on Prograf and Valcyte -Continue Bactrim for PCP prophylaxis. - cont. To hold diuretics for now.    #5 diabetes mellitus-continue Lantus and Novolog SS and will monitor.   #6. Hx of chronic  a. Fib - rate controlled.     Case discussed with Care Management/Social Worker. Management plans discussed with the patient, family and they are in agreement.  CODE STATUS: full  DVT Prophylaxis: po eliquis  TOTAL TIME  TAKING CARE OF THIS PATIENT: 30 minutes.  >50% time spent on counselling and coordination of care  POSSIBLE D/C IN 1-2 DAYS, DEPENDING ON CLINICAL CONDITION.  Note: This dictation was prepared with Dragon dictation along with smaller phrase technology. Any transcriptional errors that result from this process are unintentional.  Howard Bunte M.D on 10/16/2015 at 1:55 PM  Between 7am to 6pm - Pager - [phone]  After 6pm go to [email or web address] - password [email or web address] [facility]  Office  [phone]  CC: Primary care physician; [name], MD

## 2015-10-16 NOTE — Consult Note (Signed)
Pharmacy Antibiotic Note  [name] is a 55 y.o. male admitted on 10/12/2015 with leg wound infection.  Pharmacy has been consulted for daptomycin and meropenem dosing.  Plan: Pt has a hx of kidney transplant. Dr. Jimmye Norman spoke with Dr. Johnnye Sima of ID who recommended daptomycin.  Continue: Meropenem 1g q 8 hours daptomycin 67m/kg q 24 hours. Will get a baseline CPK and monitor CPK weekly (next due 7/27)  Height: _0  (177.8 cm) Weight: 265 lb (120.2 kg) IBW/kg (Calculated) : 73  Temp (24hrs), Avg:98.1 F (36.7 C), Min:98.1 F (36.7 C), Max:98.1 F (36.7 C)   Recent Labs Lab 10/12/15 1642 10/13/15 0453 10/14/15 0521  WBC 6.6 7.4 6.1  CREATININE 0.85 0.80  --     Estimated Creatinine Clearance: 134 mL/min (by C-G formula based on SCr of 0.8 mg/dL).    Allergies  Allergen Reactions  . Morphine Itching    Antimicrobials this admission: daptomycin 7/21 >>  meropenem 7/20 >>   Dose adjustments this admission:   Microbiology results:   Thank you for allowing pharmacy to be a part of this patient's care.  MRamond Dial Pharm.D Clinical Pharmacist t 10/16/2015

## 2015-10-16 NOTE — Care Management Note (Addendum)
Case Management Note  Patient Details  Name: CORNELIUS MARULLO MRN: [id number] Date of Birth: January 10, 1961  Subjective/Objective:     Discussed discharge planning with Mr Salmons and his wife. They chose [facility] from a list of home health providers as their provider for [place]. A referral was called to [facility] with a request to advise about co-pays. No Co-pay for [facility] services per [facility]. Mr Lazarz has a home CPAP machine which he uses at night. Case Management will follow for discharge planning.                 Action/Plan:   Expected Discharge Date:                  Expected Discharge Plan:     In-House Referral:     Discharge planning Services     Post Acute Care Choice:    Choice offered to:     DME Arranged:    DME Agency:     HH Arranged:    HH Agency:     Status of Service:     If discussed at [employer] of Stay Meetings, dates discussed:    Additional Comments:  Sukanya Goldblatt A, RN 10/16/2015, 11:44 AM

## 2015-10-17 LAB — CULTURE, BLOOD (ROUTINE X 2)
CULTURE: NO GROWTH
CULTURE: NO GROWTH

## 2015-10-17 LAB — GLUCOSE, CAPILLARY
GLUCOSE-CAPILLARY: 100 mg/dL — AB (ref 65–99)
GLUCOSE-CAPILLARY: 185 mg/dL — AB (ref 65–99)
GLUCOSE-CAPILLARY: 159 mg/dL — AB (ref 65–99)
GLUCOSE-CAPILLARY: 156 mg/dL — AB (ref 65–99)
Glucose-Capillary: 95 mg/dL (ref 65–99)
Glucose-Capillary: 92 mg/dL (ref 65–99)

## 2015-10-17 LAB — AEROBIC/ANAEROBIC CULTURE (SURGICAL/DEEP WOUND): CULTURE: NO GROWTH

## 2015-10-17 LAB — AEROBIC/ANAEROBIC CULTURE W GRAM STAIN (SURGICAL/DEEP WOUND)

## 2015-10-17 LAB — SURGICAL PATHOLOGY

## 2015-10-17 MED ORDER — HYDROMORPHONE HCL 1 MG/ML IJ SOLN
2.0000 mg | INTRAMUSCULAR | Status: DC | PRN
  Administered 2015-10-18: 2 mg via INTRAVENOUS
  Filled 2015-10-17: qty 2

## 2015-10-17 MED ORDER — FUROSEMIDE 40 MG PO TABS
40.0000 mg | ORAL_TABLET | ORAL | Status: DC
  Administered 2015-10-17: 40 mg via ORAL
  Filled 2015-10-17: qty 1

## 2015-10-17 NOTE — Progress Notes (Signed)
Physical Therapy Evaluation Patient Details Name: [name] MRN: [id number] DOB: Aug 15, 1960 Today's Date: 10/17/2015   History of Present Illness  Pt is a 55 y/o male admitted with L LE cellulitis. Pt reports he bumped his L shin into an umbrella stand and the skin was coming off. Pt is s/p L LE hematoma incision and drainage with debridement. PMH includes HTN, DM, and chronic a-fib.  Clinical Impression  Pt is a pleasant and motivated 55 y/o male who is POD#2 for L LE hematoma drainage and debridement. PLOF: pt was independent with all household/community ambulation and ADLs. Pt reports 6/10 L LE pain at beginning of evaluation, was limited due to pain throughout evaluation. Pt was swollen and tender to palpation at L ankle and reports increase in pain when sock was placed on L foot. Pt is modified independent for bed mobility and requires +2 mod assist for sit/stand transfers. Pt reported an increase in L LE pain to 7/10 when sitting on EOB and 10/10 L LE pain shooting into toes when coming to standing. Pt demonstrates heavy breathing with changes in position, most likely due to L LE pain. O2 sats monitored, 93-96%  throughout session. Pt required verbal cueing for hand and foot placement with sit/stand transfers and relied on B UE support to decrease pressure on L LE. Lateral weight shifting with RW performed x 4 to each side with increase in L LE pain. RN notified of pt request for pain medication at end of evaluation. Pt will benefit from skilled PT in order to improve functional mobility, balance, and safety with DME use. At this time pt is appropriate for HHPT to address goals.     Follow Up Recommendations Home health PT    Equipment Recommendations  Rolling walker with 5" wheels    Recommendations for Other Services       Precautions / Restrictions Precautions Precautions: Fall Restrictions Weight Bearing Restrictions: No      Mobility  Bed Mobility Overal bed mobility:  Modified Independent             General bed mobility comments: Pt used B UE support to pull on rails to assist with sitting on EOB. Pt reports an increase in pain to 7/10 with L LE hanging off EOB. Slowly progressed to bringing L LE to fully rest on floor.    Transfers Overall transfer level: Needs assistance Equipment used: Rolling walker (2 wheeled) Transfers: Sit to/from Stand Sit to Stand: +2 physical assistance;Mod assist         General transfer comment: Pt required verbal cueing for anterior lean and foot placement for sit/stand transfer. Pt relies on B UE support through RW to assist with taking weight off of L LE. Slowly progressed to having L LE resting on floor.  Pt reports an increase in pain to 10/10 with shooting sensation into toes.  Ambulation/Gait Ambulation/Gait assistance:  (Not attempted due to pain)           General Gait Details: Not attempted due to pain. Will attempt at next date.  Stairs            Wheelchair Mobility    Modified Rankin (Stroke Patients Only)       Balance Overall balance assessment: Needs assistance Sitting-balance support: Feet supported;No upper extremity supported Sitting balance-Leahy Scale: Normal Sitting balance - Comments: Pt demonstrates good sitting balance. Pt required B UE support on bed to begin but was able to progress to no UE support on bed  once pain decreased,   Standing balance support: Bilateral upper extremity supported Standing balance-Leahy Scale: Good Standing balance comment: Pt requires B UE support on RW for standing balance. Pt slowly progressed to putting weight through B LE, increased L LE pain to 10/10.                              Pertinent Vitals/Pain Pain Assessment: 0-10 Pain Score: 6  Pain Location: L LE Pain Descriptors / Indicators: Shooting Pain Intervention(s): Limited activity within patient's tolerance;Monitored during session;Patient requesting pain meds-RN  notified    Home Living Family/patient expects to be discharged to:: Private residence Living Arrangements: Spouse/significant other;Children Available Help at Discharge: Family Type of Home: House Home Access: Stairs to enter Entrance Stairs-Rails: None (Post on L side to assist) Entrance Stairs-Number of Steps: 2 Home Layout: One level Home Equipment: Cane - single point (Father in [place] cane. Pt does not use.)      Prior Function Level of Independence: Independent         Comments: Prior to admission, pt was independent with all household and community ambulation. Pt was also independent with all ADLs.     Hand Dominance        Extremity/Trunk Assessment   Upper Extremity Assessment: Overall WFL for tasks assessed           Lower Extremity Assessment: Overall WFL for tasks assessed (Pt seemed to be limited mostly due to pain.)         Communication   Communication: No difficulties  Cognition Arousal/Alertness: Awake/alert Behavior During Therapy: WFL for tasks assessed/performed Overall Cognitive Status: Within Functional Limits for tasks assessed                      General Comments      Exercises        Assessment/Plan    PT Assessment Patient needs continued PT services  PT Diagnosis Acute pain;Difficulty walking   PT Problem List Decreased activity tolerance;Decreased balance;Decreased mobility;Decreased knowledge of use of DME;Pain  PT Treatment Interventions DME instruction;Gait training;Stair training;Functional mobility training;Therapeutic activities;Therapeutic exercise;Patient/family education   PT Goals (Current goals can be found in the Care Plan section) Acute Rehab PT Goals Patient Stated Goal: To return home and decrease pain PT Goal Formulation: With patient Time For Goal Achievement: 10/31/15 Potential to Achieve Goals: Good    Frequency Min 2X/week   Barriers to discharge        Co-evaluation                End of Session Equipment Utilized During Treatment: Gait belt Activity Tolerance: Patient limited by pain Patient left: in bed;with call bell/phone within reach;with bed alarm set;with family/visitor present Nurse Communication: Mobility status;Patient requests pain meds         Time: 1003-1029 PT Time Calculation (min) (ACUTE ONLY): 26 min   Charges:   PT Evaluation $PT Eval Moderate Complexity: 1 Procedure     PT G Codes:        Keeton Kassebaum Nov 05, 2015, 2:55 PM  Georgina Pillion, SPT [phone]

## 2015-10-17 NOTE — Progress Notes (Signed)
55 yr old male POD#2 from Left lower extremity hematoma incision and drainage.  Patient states the pain is much improved. He did put weight on it while going to the bathroom and stated that it was painful. Otherwise pain well controlled with meds, states that the area feels better today.    Vitals:   10/16/15 2335 10/17/15 0432  BP: 122/66 (!) 163/96  Pulse: 69 74  Resp: 17 20  Temp: 98.4 F (36.9 C) 97.9 F (36.6 C)   I/O last 3 completed shifts: In: 77 [P.O.:480; IV Piggyback:50] Out: 1450 [Urine:1450] Total I/O In: 240 [P.O.:240] Out: 250 [Urine:250]   PE: Gen: NAD LLE: cavity with good granulation material, no erythema, repacked with iodoform gauze, some denuded skin to anterior tibia, ankle edema much improved, almost completely resolved, some ecchymosis along ankle but no erythema   A/P:  55 yr old male POD#2 from Left lower extremity hematoma incision and drainage  Wound healing, edema improved, cultures negative, merrem d/c'd today, spoke with Dr. Posey Pronto, will have PT evaluate, continue dressing changes

## 2015-10-17 NOTE — Progress Notes (Signed)
[name] at [place] NAME: [name]    MR#:  [id number]  DATE OF BIRTH:  11-15-1960  SUBJECTIVE:  POD #2 s/p evacuation of hematoma Mild left ankle swelling and redness-stable No fever  REVIEW OF SYSTEMS:   Review of Systems  Constitutional: Negative for chills, fever and weight loss.  HENT: Negative for ear discharge, ear pain and nosebleeds.   Eyes: Negative for blurred vision, pain and discharge.  Respiratory: Negative for sputum production, shortness of breath, wheezing and stridor.   Cardiovascular: Negative for chest pain, palpitations, orthopnea and PND.  Gastrointestinal: Negative for abdominal pain, diarrhea, nausea and vomiting.  Genitourinary: Negative for frequency and urgency.  Musculoskeletal: Negative for back pain and joint pain.  Neurological: Positive for weakness. Negative for sensory change, speech change and focal weakness.  Psychiatric/Behavioral: Negative for depression and hallucinations. The patient is not nervous/anxious.    Tolerating Diet:yes Tolerating PT: HHPT  DRUG ALLERGIES:   Allergies  Allergen Reactions  . Morphine Itching    VITALS:  Blood pressure (!) 163/96, pulse 74, temperature 97.9 F (36.6 C), temperature source Oral, resp. rate 20, height 5' 10" (1.778 m), weight 120.2 kg (265 lb), SpO2 100 %.  PHYSICAL EXAMINATION:   Physical Exam  GENERAL:  55 y.o.-year-old patient lying in the bed with no acute distress.  EYES: Pupils equal, round, reactive to light and accommodation. No scleral icterus. Extraocular muscles intact.  HEENT: Head atraumatic, normocephalic. Oropharynx and nasopharynx clear.  NECK:  Supple, no jugular venous distention. No thyroid enlargement, no tenderness.  LUNGS: Normal breath sounds bilaterally, no wheezing, rales, rhonchi. No use of accessory muscles of respiration.  CARDIOVASCULAR: S1, S2 normal. No murmurs, rubs, or gallops.  ABDOMEN: Soft,  nontender, nondistended. Bowel sounds present. No organomegaly or mass.  EXTREMITIES: No cyanosis, clubbing or edema b/l.   Surgical dressing over the left leg NEUROLOGIC: Cranial nerves II through XII are intact. No focal Motor or sensory deficits b/l.   PSYCHIATRIC:  patient is alert and oriented x 3.  SKIN: No obvious rash, lesion, or ulcer.   LABORATORY PANEL:  CBC  Recent Labs Lab 10/14/15 0521  WBC 6.1  HGB 11.9*  HCT 34.0*  PLT 164    Chemistries   Recent Labs Lab 10/13/15 0453  NA 138  K 3.4*  CL 103  CO2 31  GLUCOSE 106*  BUN 15  CREATININE 0.80  CALCIUM 8.8*   Cardiac Enzymes No results for input(s): TROPONINI in the last 168 hours. RADIOLOGY:  No results found. ASSESSMENT AND PLAN:  [name] is a 55 y.o. male with a known history of Hypertension, insulin-dependent diabetes mellitus, obstructive sleep apnea on CPAP, chronic atrial fibrillation, history of end-stage renal disease now status post renal transplant and normal kidney function, history of CAD status post CABG presents to the hospital secondary to worsening left shin wound.  #1 left leg Hematoma: Status post incision and drainage by surgery POD #2 -Tolerated the procedure well.  -pt on IV daptomycin and  meropenem  WC from 10/15/15 pending. Spoke with ID on the phone. Ok to d/c meropenem -BC negative -WBC normal.   #2 hypertension-continue lisinopril and Norvasc  #3 hypokalemia- improved w/ supplementation    #4 history of end-stage renal disease status post renal transplant-currently on Prograf and Valcyclovir -Continue Bactrim for PCP prophylaxis. - cont. To hold diuretics for now.     #5 diabetes mellitus-continue Lantus and Novolog SS and will monitor.   #  6. Hx of chronic a. Fib - rate controlled.     #7 PT recommends HHPT  Case discussed with Care Management/Social Worker. Management plans discussed with the patient, family and they are in agreement.  CODE STATUS:  full  DVT Prophylaxis: po eliquis  TOTAL TIME TAKING CARE OF THIS PATIENT: 30 minutes.  >50% time spent on counselling and coordination of care  POSSIBLE D/C IN 1-2 DAYS, DEPENDING ON CLINICAL CONDITION.  Note: This dictation was prepared with Dragon dictation along with smaller phrase technology. Any transcriptional errors that result from this process are unintentional.  Jontavious Commons M.D on 10/17/2015 at 11:12 AM  Between 7am to 6pm - Pager - [phone]  After 6pm go to [email or web address] - password [email or web address] [facility]  Office  [phone]  CC: Primary care physician; [name], MD

## 2015-10-18 LAB — BASIC METABOLIC PANEL
Anion gap: 4 — ABNORMAL LOW (ref 5–15)
BUN: 16 mg/dL (ref 6–20)
CHLORIDE: 105 mmol/L (ref 101–111)
CO2: 31 mmol/L (ref 22–32)
Calcium: 8.8 mg/dL — ABNORMAL LOW (ref 8.9–10.3)
Creatinine, Ser: 0.74 mg/dL (ref 0.61–1.24)
GFR calc Af Amer: >60 mL/min (ref >60)
GFR calc non Af Amer: >60 mL/min (ref >60)
GLUCOSE: 105 mg/dL — AB (ref 65–99)
POTASSIUM: 4.2 mmol/L (ref 3.5–5.1)
Sodium: 140 mmol/L (ref 135–145)

## 2015-10-18 LAB — CBC
HEMATOCRIT: 33.6 % — AB (ref 40.0–52.0)
HEMOGLOBIN: 11.7 g/dL — AB (ref 13.0–18.0)
MCH: 29.9 pg (ref 26.0–34.0)
MCHC: 34.7 g/dL (ref 32.0–36.0)
MCV: 86.1 fL (ref 80.0–100.0)
Platelets: 165 10*3/uL (ref 150–440)
RBC: 3.9 MIL/uL — AB (ref 4.40–5.90)
RDW: 17.1 % — ABNORMAL HIGH (ref 11.5–14.5)
WBC: 5.3 10*3/uL (ref 3.8–10.6)

## 2015-10-18 LAB — GLUCOSE, CAPILLARY
GLUCOSE-CAPILLARY: 105 mg/dL — AB (ref 65–99)
Glucose-Capillary: 79 mg/dL (ref 65–99)

## 2015-10-18 MED ORDER — MUPIROCIN CALCIUM 2 % EX CREA: TOPICAL_CREAM | Freq: Every day | CUTANEOUS | 0 refills | Status: DC

## 2015-10-18 MED ORDER — HYDROMORPHONE HCL 2 MG PO TABS: 2.0000 mg | ORAL_TABLET | Freq: Three times a day (TID) | ORAL | Status: DC | PRN

## 2015-10-18 MED ORDER — HYDROMORPHONE HCL 2 MG PO TABS: 2.0000 mg | ORAL_TABLET | Freq: Three times a day (TID) | ORAL | 0 refills | Status: DC | PRN

## 2015-10-18 NOTE — Discharge Summary (Signed)
[facility] [place] NAME: [name]    MR#:  [id number]  DATE OF BIRTH:  1960-08-11  DATE OF ADMISSION:  10/12/2015 ADMITTING PHYSICIAN: Gladstone Lighter, MD  DATE OF DISCHARGE: 10/18/15  PRIMARY CARE PHYSICIAN: [name], MD    ADMISSION DIAGNOSIS:  Wound infection ([place]) [T14.8, L08.9]  DISCHARGE DIAGNOSIS:  Left leg hematoma after injury s/p evacuation on 10/15/15  SECONDARY DIAGNOSIS:   Past Medical History:  Diagnosis Date  . Amnestic MCI (mild cognitive impairment with memory loss)   . Asthma   . Atrial fibrillation ([place])   . Atrial fibrillation ([place]) 2016  . Benign neoplasm of colon   . CAD (coronary artery disease) 2005   had CABG  . CHF (congestive heart failure) ([place])   . Chronic venous stasis dermatitis of both lower extremities   . Depression   . Diabetes mellitus   . Diverticulosis   . Dyspnea   . Erectile dysfunction   . ESRD (end stage renal disease) ([place]) 2005   Dialysis then renal transplant  . GERD (gastroesophageal reflux disease)   . Gout   . Hyperlipidemia    low since renal failure  . Hypertension   . Kidney transplant status, cadaveric 2012  . Obesity   . Pneumonia   . Purpura ([place])   . Sleep apnea    bipap with oxygen 2 l  . Trigger finger   . Wears glasses     HOSPITAL COURSE:   Carl Garciais a 55 y.o. male with a known history of Hypertension, insulin-dependent diabetes mellitus, obstructive sleep apnea on CPAP, chronic atrial fibrillation, history of end-stage renal disease now status post renal transplant and normal kidney function, history of CAD status post CABG presents to the hospital secondary to worsening left shin wound.  #1 left leg Hematoma: Status post incision and drainage by surgery POD #3 -Tolerated the procedure well.  -recieved IV daptomycin and  meropenem  For 7 days -WC negative todate rare staph coag negative -BC negative -WBC normal.  -spoke with  ID at [facility]. No need for abxs at d/c  #2 hypertension-continue lisinopril and Norvasc  #3 hypokalemia- improved w/ supplementation   #4 history of end-stage renal disease status post renal transplant-currently on Prograf and Valcyte -Continue Bactrim for PCP prophylaxis.  #5 diabetes mellitus-continue Lantus and Novolog SS and will monitor.   #6. Hx of chronic a. Fib - rate controlled.   PT recommends HHPT  D/c today. Ok from surgery standpoint CONSULTS OBTAINED:  Treatment Team:  [name], MD  DRUG ALLERGIES:   Allergies  Allergen Reactions  . Morphine Itching    DISCHARGE MEDICATIONS:   Current Discharge Medication List    START taking these medications   Details  HYDROmorphone (DILAUDID) 2 MG tablet Take 1 tablet (2 mg total) by mouth every 8 (eight) hours as needed for severe pain. Qty: 25 tablet, Refills: 0    mupirocin cream (BACTROBAN) 2 % Apply topically daily. Qty: 15 g, Refills: 0      CONTINUE these medications which have NOT CHANGED   Details  amLODipine (NORVASC) 5 MG tablet Take 1 tablet (5 mg total) by mouth daily. Qty: 90 tablet, Refills: 3    buPROPion (WELLBUTRIN) 75 MG tablet Take 1 tablet (75 mg total) by mouth 2 (two) times daily. Qty: 180 tablet, Refills: 3    ELIQUIS 5 MG TABS tablet TAKE 1 TABLET BY MOUTH 2 TIMES DAILY. Qty: 60 tablet,  Refills: 0    furosemide (LASIX) 40 MG tablet Take 40 mg by mouth daily. Every other day    HYDROcodone-acetaminophen (NORCO/VICODIN) 5-325 MG tablet Take 1 tablet by mouth every 8 (eight) hours as needed for moderate pain. Qty: 30 tablet, Refills: 0   Associated Diagnoses: Wound of left lower extremity, subsequent encounter    insulin aspart (NOVOLOG FLEXPEN) 100 UNIT/ML FlexPen Inject 1-5 Units into the skin 3 (three) times daily with meals as needed for high blood sugar. Pt uses as needed per sliding scale.    insulin glargine (LANTUS) 100 unit/mL SOPN Inject 20 Units into the skin at  bedtime.    lisinopril (PRINIVIL,ZESTRIL) 40 MG tablet Take 40 mg by mouth daily.    metolazone (ZAROXOLYN) 2.5 MG tablet Take 2.5 mg by mouth daily as needed (for edema).     mometasone (NASONEX) 50 MCG/ACT nasal spray Place 2 sprays into both nostrils at bedtime.    Multiple Vitamin (MULTIVITAMIN WITH MINERALS) TABS tablet Take 1 tablet by mouth daily.    omeprazole (PRILOSEC) 20 MG capsule Take 20 mg by mouth daily.    potassium chloride (K-DUR,KLOR-CON) 10 MEQ tablet Take 30 mEq by mouth daily.    sulfamethoxazole-trimethoprim (BACTRIM DS,SEPTRA DS) 800-160 MG tablet Take 1 tablet by mouth 3 (three) times a week. Qty: 30 tablet, Refills: 0    tacrolimus (PROGRAF) 0.5 MG capsule Take 0.5-1 mg by mouth 2 (two) times daily. 1 mg qam, 0.5 mg qpm    valGANciclovir (VALCYTE) 450 MG tablet Take 2 tablets (900 mg total) by mouth 2 (two) times daily. Qty: 30 tablet, Refills: 0      STOP taking these medications     cephALEXin (KEFLEX) 500 MG capsule      silver sulfADIAZINE (SILVADENE) 1 % cream         If you experience worsening of your admission symptoms, develop shortness of breath, life threatening emergency, suicidal or homicidal thoughts you must seek medical attention immediately by calling 911 or calling your MD immediately  if symptoms less severe.  You Must read complete instructions/literature along with all the possible adverse reactions/side effects for all the Medicines you take and that have been prescribed to you. Take any new Medicines after you have completely understood and accept all the possible adverse reactions/side effects.   Please note  You were cared for by a hospitalist during your hospital stay. If you have any questions about your discharge medications or the care you received while you were in the hospital after you are discharged, you can call the unit and asked to speak with the hospitalist on call if the hospitalist that took care of you is not  available. Once you are discharged, your primary care physician will handle any further medical issues. Please note that NO REFILLS for any discharge medications will be authorized once you are discharged, as it is imperative that you return to your primary care physician (or establish a relationship with a primary care physician if you do not have one) for your aftercare needs so that they can reassess your need for medications and monitor your lab values. Today   SUBJECTIVE   Doing well. Some leg pain with activity  VITAL SIGNS:  Blood pressure 139/62, pulse 60, temperature 98.2 F (36.8 C), temperature source Oral, resp. rate 20, height _0  (1.778 m), weight 120.2 kg (265 lb), SpO2 95 %.  I/O:   Intake/Output Summary (Last 24 hours) at 10/18/15 1059 Last data filed at  10/18/15 0900  Gross per 24 hour  Intake              660 ml  Output             1475 ml  Net             -815 ml    PHYSICAL EXAMINATION:  GENERAL:  55 y.o.-year-old patient lying in the bed with no acute distress.  EYES: Pupils equal, round, reactive to light and accommodation. No scleral icterus. Extraocular muscles intact.  HEENT: Head atraumatic, normocephalic. Oropharynx and nasopharynx clear.  NECK:  Supple, no jugular venous distention. No thyroid enlargement, no tenderness.  LUNGS: Normal breath sounds bilaterally, no wheezing, rales,rhonchi or crepitation. No use of accessory muscles of respiration.  CARDIOVASCULAR: S1, S2 normal. No murmurs, rubs, or gallops.  ABDOMEN: Soft, non-tender, non-distended. Bowel sounds present. No organomegaly or mass.  EXTREMITIES: No pedal edema, cyanosis, or clubbing. Left leg surgical dressing NEUROLOGIC: Cranial nerves II through XII are intact. Muscle strength 5/5 in all extremities. Sensation intact. Gait not checked.  PSYCHIATRIC: The patient is alert and oriented x 3.  SKIN: No obvious rash, lesion, or ulcer.   DATA REVIEW:   CBC   Recent Labs Lab  10/14/15 0521  WBC 6.1  HGB 11.9*  HCT 34.0*  PLT 164    Chemistries   Recent Labs Lab 10/13/15 0453  NA 138  K 3.4*  CL 103  CO2 31  GLUCOSE 106*  BUN 15  CREATININE 0.80  CALCIUM 8.8*    Microbiology Results   Recent Results (from the past 240 hour(s))  Aerobic/Anaerobic Culture (surgical/deep wound)     Status: None   Collection Time: 10/12/15  6:08 PM  Result Value Ref Range Status   Specimen Description WOUND LEFT LEG  Final   Special Requests Immunocompromised  Final   Gram Stain   Final    FEW WBC PRESENT, PREDOMINANTLY PMN NO ORGANISMS SEEN    Culture   Final    No growth aerobically or anaerobically. Performed at [facility]    Report Status 10/17/2015 FINAL  Final  CULTURE, BLOOD (ROUTINE X 2) w Reflex to ID Panel     Status: None   Collection Time: 10/12/15  6:59 PM  Result Value Ref Range Status   Specimen Description BLOOD RT HAND  Final   Special Requests BOTTLES DRAWN AEROBIC AND ANAEROBIC 4CC  Final   Culture NO GROWTH 5 DAYS  Final   Report Status 10/17/2015 FINAL  Final  CULTURE, BLOOD (ROUTINE X 2) w Reflex to ID Panel     Status: None   Collection Time: 10/12/15  6:59 PM  Result Value Ref Range Status   Specimen Description BLOOD RAC  Final   Special Requests BOTTLES DRAWN AEROBIC AND ANAEROBIC 8CC  Final   Culture NO GROWTH 5 DAYS  Final   Report Status 10/17/2015 FINAL  Final  MRSA PCR Screening     Status: None   Collection Time: 10/14/15  3:30 PM  Result Value Ref Range Status   MRSA by PCR NEGATIVE NEGATIVE Final    Comment:        The GeneXpert MRSA Assay (FDA approved for NASAL specimens only), is one component of a comprehensive MRSA colonization surveillance program. It is not intended to diagnose MRSA infection nor to guide or monitor treatment for MRSA infections.   Aerobic/Anaerobic Culture (surgical/deep wound)     Status: None (Preliminary result)   Collection  Time: 10/15/15  8:28 AM  Result Value Ref  Range Status   Specimen Description WOUND LEFT LOWER LEG KNEE  Final   Special Requests PATIENT ON FOLLOWING  DAPTOMYCIN AND MEROPENEM   Final   Gram Stain   Final    FEW WBC PRESENT, PREDOMINANTLY PMN NO ORGANISMS SEEN Performed at [facility]    Culture   Final    RARE STAPHYLOCOCCUS SPECIES (COAGULASE NEGATIVE) NO ANAEROBES ISOLATED; CULTURE IN PROGRESS FOR 5 DAYS    Report Status PENDING  Incomplete    RADIOLOGY:  No results found.   Management plans discussed with the patient, family and they are in agreement.  CODE STATUS:     Code Status Orders        Start     Ordered   10/12/15 2006  Full code  Continuous     10/12/15 2005    Code Status History    Date Active Date Inactive Code Status Order ID Comments User Context   08/30/2015  9:22 PM 09/04/2015 10:17 PM Full Code [id number]  Fritzi Mandes, MD Inpatient   10/13/2014  7:35 AM 10/14/2014  4:47 PM Full Code [id number]  Juluis Mire, MD Inpatient    Advance Directive Documentation   Flowsheet Row Most Recent Value  Type of Advance Directive  Living will  Pre-existing out of facility DNR order (yellow form or pink MOST form)  No data  "MOST" Form in Place?  No data      TOTAL TIME TAKING CARE OF THIS PATIENT:40 minutes.    Javanni Maring M.D on 10/18/2015 at 10:59 AM  Between 7am to 6pm - Pager - [phone] After 6pm go to [email or web address] - password [email or web address] [place] [facility]  Office  [phone]  CC: Primary care physician; [name], MD

## 2015-10-18 NOTE — Care Management Important Message (Signed)
Important Message  Patient Details  Name: [name] MRN: [id number] Date of Birth: 03-18-1961   Medicare Important Message Given:  Yes    Beverly Sessions, RN 10/18/2015, 10:54 AM

## 2015-10-18 NOTE — Care Management (Signed)
Patient to discharge home today.  [name] with [facility] has been notified of discharge order for PT and RN.  RW walker to be delivered to room prior to discharge.  RNCM signing off.

## 2015-10-18 NOTE — Discharge Instructions (Signed)
Dressing changes per instructions

## 2015-10-18 NOTE — Progress Notes (Signed)
Pt A and O x 4. VSS. Pt tolerating diet well. No complaints of pain or nausea. IV removed intact, prescriptions given. Pt voiced understanding of discharge instructions with no further questions. Dressing change teaching performed. Pt had home PT and nursing setup. Pt discharged via wheelchair with axillary.

## 2015-10-19 DIAGNOSIS — I10 Essential (primary) hypertension: Secondary | ICD-10-CM | POA: Diagnosis not present

## 2015-10-19 DIAGNOSIS — Z48817 Encounter for surgical aftercare following surgery on the skin and subcutaneous tissue: Secondary | ICD-10-CM | POA: Diagnosis not present

## 2015-10-19 DIAGNOSIS — I872 Venous insufficiency (chronic) (peripheral): Secondary | ICD-10-CM | POA: Diagnosis not present

## 2015-10-19 DIAGNOSIS — I482 Chronic atrial fibrillation: Secondary | ICD-10-CM | POA: Diagnosis not present

## 2015-10-19 DIAGNOSIS — Z79899 Other long term (current) drug therapy: Secondary | ICD-10-CM | POA: Diagnosis not present

## 2015-10-19 DIAGNOSIS — Z7901 Long term (current) use of anticoagulants: Secondary | ICD-10-CM | POA: Diagnosis not present

## 2015-10-19 DIAGNOSIS — G4733 Obstructive sleep apnea (adult) (pediatric): Secondary | ICD-10-CM | POA: Diagnosis not present

## 2015-10-19 DIAGNOSIS — Z940 Kidney transplant status: Secondary | ICD-10-CM | POA: Diagnosis not present

## 2015-10-19 DIAGNOSIS — E119 Type 2 diabetes mellitus without complications: Secondary | ICD-10-CM | POA: Diagnosis not present

## 2015-10-19 DIAGNOSIS — J45909 Unspecified asthma, uncomplicated: Secondary | ICD-10-CM | POA: Diagnosis not present

## 2015-10-19 DIAGNOSIS — I2510 Atherosclerotic heart disease of native coronary artery without angina pectoris: Secondary | ICD-10-CM | POA: Diagnosis not present

## 2015-10-19 DIAGNOSIS — Z7951 Long term (current) use of inhaled steroids: Secondary | ICD-10-CM | POA: Diagnosis not present

## 2015-10-19 DIAGNOSIS — Z794 Long term (current) use of insulin: Secondary | ICD-10-CM | POA: Diagnosis not present

## 2015-10-19 DIAGNOSIS — S8012XD Contusion of left lower leg, subsequent encounter: Secondary | ICD-10-CM | POA: Diagnosis not present

## 2015-10-19 DIAGNOSIS — S81802D Unspecified open wound, left lower leg, subsequent encounter: Secondary | ICD-10-CM | POA: Diagnosis not present

## 2015-10-20 ENCOUNTER — Telehealth: Payer: Self-pay

## 2015-10-20 DIAGNOSIS — E1122 Type 2 diabetes mellitus with diabetic chronic kidney disease: Secondary | ICD-10-CM | POA: Diagnosis not present

## 2015-10-20 DIAGNOSIS — B259 Cytomegaloviral disease, unspecified: Secondary | ICD-10-CM | POA: Diagnosis not present

## 2015-10-20 DIAGNOSIS — E8770 Fluid overload, unspecified: Secondary | ICD-10-CM | POA: Diagnosis not present

## 2015-10-20 DIAGNOSIS — Z79899 Other long term (current) drug therapy: Secondary | ICD-10-CM | POA: Diagnosis not present

## 2015-10-20 DIAGNOSIS — E669 Obesity, unspecified: Secondary | ICD-10-CM | POA: Diagnosis not present

## 2015-10-20 DIAGNOSIS — S81802D Unspecified open wound, left lower leg, subsequent encounter: Secondary | ICD-10-CM | POA: Diagnosis not present

## 2015-10-20 DIAGNOSIS — Z4822 Encounter for aftercare following kidney transplant: Secondary | ICD-10-CM | POA: Diagnosis not present

## 2015-10-20 DIAGNOSIS — G4733 Obstructive sleep apnea (adult) (pediatric): Secondary | ICD-10-CM | POA: Diagnosis not present

## 2015-10-20 DIAGNOSIS — Z940 Kidney transplant status: Secondary | ICD-10-CM | POA: Diagnosis not present

## 2015-10-20 DIAGNOSIS — I872 Venous insufficiency (chronic) (peripheral): Secondary | ICD-10-CM | POA: Diagnosis not present

## 2015-10-20 DIAGNOSIS — I129 Hypertensive chronic kidney disease with stage 1 through stage 4 chronic kidney disease, or unspecified chronic kidney disease: Secondary | ICD-10-CM | POA: Diagnosis not present

## 2015-10-20 DIAGNOSIS — S8012XD Contusion of left lower leg, subsequent encounter: Secondary | ICD-10-CM | POA: Diagnosis not present

## 2015-10-20 DIAGNOSIS — Z7901 Long term (current) use of anticoagulants: Secondary | ICD-10-CM | POA: Diagnosis not present

## 2015-10-20 DIAGNOSIS — I10 Essential (primary) hypertension: Secondary | ICD-10-CM | POA: Diagnosis not present

## 2015-10-20 DIAGNOSIS — Z794 Long term (current) use of insulin: Secondary | ICD-10-CM | POA: Diagnosis not present

## 2015-10-20 DIAGNOSIS — E119 Type 2 diabetes mellitus without complications: Secondary | ICD-10-CM | POA: Diagnosis not present

## 2015-10-20 DIAGNOSIS — E876 Hypokalemia: Secondary | ICD-10-CM | POA: Diagnosis not present

## 2015-10-20 DIAGNOSIS — E872 Acidosis: Secondary | ICD-10-CM | POA: Diagnosis not present

## 2015-10-20 DIAGNOSIS — I4891 Unspecified atrial fibrillation: Secondary | ICD-10-CM | POA: Diagnosis not present

## 2015-10-20 DIAGNOSIS — R600 Localized edema: Secondary | ICD-10-CM | POA: Diagnosis not present

## 2015-10-20 DIAGNOSIS — N186 End stage renal disease: Secondary | ICD-10-CM | POA: Diagnosis not present

## 2015-10-20 DIAGNOSIS — Z48817 Encounter for surgical aftercare following surgery on the skin and subcutaneous tissue: Secondary | ICD-10-CM | POA: Diagnosis not present

## 2015-10-20 DIAGNOSIS — L03116 Cellulitis of left lower limb: Secondary | ICD-10-CM | POA: Diagnosis not present

## 2015-10-20 DIAGNOSIS — Z7951 Long term (current) use of inhaled steroids: Secondary | ICD-10-CM | POA: Diagnosis not present

## 2015-10-20 DIAGNOSIS — D8989 Other specified disorders involving the immune mechanism, not elsewhere classified: Secondary | ICD-10-CM | POA: Diagnosis not present

## 2015-10-20 DIAGNOSIS — I2510 Atherosclerotic heart disease of native coronary artery without angina pectoris: Secondary | ICD-10-CM | POA: Diagnosis not present

## 2015-10-20 DIAGNOSIS — Z6837 Body mass index (BMI) 37.0-37.9, adult: Secondary | ICD-10-CM | POA: Diagnosis not present

## 2015-10-20 LAB — AEROBIC/ANAEROBIC CULTURE (SURGICAL/DEEP WOUND)

## 2015-10-20 LAB — AEROBIC/ANAEROBIC CULTURE W GRAM STAIN (SURGICAL/DEEP WOUND)

## 2015-10-20 NOTE — Telephone Encounter (Signed)
Spoke with patient. Currently taking dilaudid 9m 1 tablet Q8 hours PRN pain. Not controlling pain especially when nurse comes to change dressing. Suggested start tylenol 5067mTID scheduled over weekend, ok to try dilaudid 75m50m1.5 tablets) Q8 hours PRN pain. Pt will update us [name] Monday.  Verified with patient he is currently not taking vicodin.

## 2015-10-20 NOTE — Telephone Encounter (Signed)
Transition Care Management Follow-up Telephone Call   Date discharged? 10/18/2015   How have you been since you were released from the hospital? Pt is experiencing uncontrolled pain 6-7/10 and feel discomfort to wound   Do you understand why you were in the hospital? Yes   Do you understand the discharge instructions? Yes   Where were you discharged to? Home; support system of wife and son   Items Reviewed:  Medications reviewed: Yes  Allergies reviewed: Yes  Dietary changes reviewed: Yes  Referrals reviewed: Yes   Functional Questionnaire:   Activities of Daily Living (ADLs):   Personal hygiene - independent/bathes in "bucket" beside bed Dressing - independent Eating - independent  Maintaining continence - continent  Transferring - uses walker for ambulation  Independent Activities of Daily Living (ADLs): Basic communication skills - independent  Transportation - wife or son does driving Meal preparation - wife or son prepares meals  Shopping - wife or son does shopping Housework - wife or son does housework Managing medications - patient and wife manage medications Managing personal finances - independent    Any transportation issues/concerns?: NO   Any patient concerns? NO   Confirmed importance and date/time of follow-up visits scheduled YES  Provider Appointment booked with Dr. Silvio Pate on 10/26/15 @ 1615  Confirmed with patient if condition begins to worsen call PCP or go to the ER.  Patient was given the office number and encouraged to call back with question or concerns.  : YES

## 2015-10-20 NOTE — Telephone Encounter (Signed)
Attempted to reach Dr. Posey Pronto by calling office and paging her. Unable to reach her to discuss patient concerns.

## 2015-10-21 DIAGNOSIS — I872 Venous insufficiency (chronic) (peripheral): Secondary | ICD-10-CM | POA: Diagnosis not present

## 2015-10-21 DIAGNOSIS — S8012XD Contusion of left lower leg, subsequent encounter: Secondary | ICD-10-CM | POA: Diagnosis not present

## 2015-10-21 DIAGNOSIS — G4733 Obstructive sleep apnea (adult) (pediatric): Secondary | ICD-10-CM | POA: Diagnosis not present

## 2015-10-21 DIAGNOSIS — S81802D Unspecified open wound, left lower leg, subsequent encounter: Secondary | ICD-10-CM | POA: Diagnosis not present

## 2015-10-21 DIAGNOSIS — Z48817 Encounter for surgical aftercare following surgery on the skin and subcutaneous tissue: Secondary | ICD-10-CM | POA: Diagnosis not present

## 2015-10-21 DIAGNOSIS — I10 Essential (primary) hypertension: Secondary | ICD-10-CM | POA: Diagnosis not present

## 2015-10-21 DIAGNOSIS — Z794 Long term (current) use of insulin: Secondary | ICD-10-CM | POA: Diagnosis not present

## 2015-10-21 DIAGNOSIS — Z7951 Long term (current) use of inhaled steroids: Secondary | ICD-10-CM | POA: Diagnosis not present

## 2015-10-21 DIAGNOSIS — Z940 Kidney transplant status: Secondary | ICD-10-CM | POA: Diagnosis not present

## 2015-10-21 DIAGNOSIS — E119 Type 2 diabetes mellitus without complications: Secondary | ICD-10-CM | POA: Diagnosis not present

## 2015-10-21 NOTE — Telephone Encounter (Signed)
Please check on him on Monday

## 2015-10-23 ENCOUNTER — Other Ambulatory Visit: Payer: Self-pay | Admitting: Cardiovascular Disease

## 2015-10-23 ENCOUNTER — Telehealth: Payer: Self-pay

## 2015-10-23 DIAGNOSIS — G4733 Obstructive sleep apnea (adult) (pediatric): Secondary | ICD-10-CM | POA: Diagnosis not present

## 2015-10-23 DIAGNOSIS — Z794 Long term (current) use of insulin: Secondary | ICD-10-CM | POA: Diagnosis not present

## 2015-10-23 DIAGNOSIS — Z48817 Encounter for surgical aftercare following surgery on the skin and subcutaneous tissue: Secondary | ICD-10-CM | POA: Diagnosis not present

## 2015-10-23 DIAGNOSIS — E119 Type 2 diabetes mellitus without complications: Secondary | ICD-10-CM | POA: Diagnosis not present

## 2015-10-23 DIAGNOSIS — I10 Essential (primary) hypertension: Secondary | ICD-10-CM | POA: Diagnosis not present

## 2015-10-23 DIAGNOSIS — S81802D Unspecified open wound, left lower leg, subsequent encounter: Secondary | ICD-10-CM | POA: Diagnosis not present

## 2015-10-23 DIAGNOSIS — S8012XD Contusion of left lower leg, subsequent encounter: Secondary | ICD-10-CM | POA: Diagnosis not present

## 2015-10-23 DIAGNOSIS — Z940 Kidney transplant status: Secondary | ICD-10-CM | POA: Diagnosis not present

## 2015-10-23 DIAGNOSIS — I872 Venous insufficiency (chronic) (peripheral): Secondary | ICD-10-CM | POA: Diagnosis not present

## 2015-10-23 DIAGNOSIS — Z7951 Long term (current) use of inhaled steroids: Secondary | ICD-10-CM | POA: Diagnosis not present

## 2015-10-23 NOTE — Telephone Encounter (Signed)
Left message to call office

## 2015-10-23 NOTE — Telephone Encounter (Signed)
Attempted to reach pt to discuss pain medication. Left message for patient stating he could take pain medication every 4 hrs instead of 8 hrs per Dr. Silvio Pate. Advised pt to contact office to schedule a same day appt if pain remains the same or worsens.

## 2015-10-24 NOTE — Telephone Encounter (Signed)
I do see that he has an appt 10-26-15

## 2015-10-24 NOTE — Telephone Encounter (Addendum)
Left message to call office, again. I also sent him a Therapist, music

## 2015-10-25 DIAGNOSIS — Z7951 Long term (current) use of inhaled steroids: Secondary | ICD-10-CM | POA: Diagnosis not present

## 2015-10-25 DIAGNOSIS — S81802D Unspecified open wound, left lower leg, subsequent encounter: Secondary | ICD-10-CM | POA: Diagnosis not present

## 2015-10-25 DIAGNOSIS — E119 Type 2 diabetes mellitus without complications: Secondary | ICD-10-CM | POA: Diagnosis not present

## 2015-10-25 DIAGNOSIS — Z48817 Encounter for surgical aftercare following surgery on the skin and subcutaneous tissue: Secondary | ICD-10-CM | POA: Diagnosis not present

## 2015-10-25 DIAGNOSIS — I872 Venous insufficiency (chronic) (peripheral): Secondary | ICD-10-CM | POA: Diagnosis not present

## 2015-10-25 DIAGNOSIS — I10 Essential (primary) hypertension: Secondary | ICD-10-CM | POA: Diagnosis not present

## 2015-10-25 DIAGNOSIS — Z940 Kidney transplant status: Secondary | ICD-10-CM | POA: Diagnosis not present

## 2015-10-25 DIAGNOSIS — S8012XD Contusion of left lower leg, subsequent encounter: Secondary | ICD-10-CM | POA: Diagnosis not present

## 2015-10-25 DIAGNOSIS — Z794 Long term (current) use of insulin: Secondary | ICD-10-CM | POA: Diagnosis not present

## 2015-10-25 DIAGNOSIS — G4733 Obstructive sleep apnea (adult) (pediatric): Secondary | ICD-10-CM | POA: Diagnosis not present

## 2015-10-26 ENCOUNTER — Encounter: Payer: Self-pay | Admitting: Internal Medicine

## 2015-10-26 ENCOUNTER — Ambulatory Visit (INDEPENDENT_AMBULATORY_CARE_PROVIDER_SITE_OTHER): Payer: 59 | Admitting: Internal Medicine

## 2015-10-26 DIAGNOSIS — S81802A Unspecified open wound, left lower leg, initial encounter: Secondary | ICD-10-CM | POA: Diagnosis not present

## 2015-10-26 DIAGNOSIS — G4733 Obstructive sleep apnea (adult) (pediatric): Secondary | ICD-10-CM | POA: Diagnosis not present

## 2015-10-26 DIAGNOSIS — L97923 Non-pressure chronic ulcer of unspecified part of left lower leg with necrosis of muscle: Secondary | ICD-10-CM

## 2015-10-26 DIAGNOSIS — L97929 Non-pressure chronic ulcer of unspecified part of left lower leg with unspecified severity: Secondary | ICD-10-CM | POA: Insufficient documentation

## 2015-10-26 DIAGNOSIS — E662 Morbid (severe) obesity with alveolar hypoventilation: Secondary | ICD-10-CM | POA: Diagnosis not present

## 2015-10-26 MED ORDER — HYDROMORPHONE HCL 2 MG PO TABS: 2.0000 mg | ORAL_TABLET | Freq: Three times a day (TID) | ORAL | 0 refills | Status: DC | PRN

## 2015-10-26 NOTE — Assessment & Plan Note (Signed)
Much better from before the hospital Reviewed all the hospital records Mostly granulated---and wound from hematoma removal seems to be getting smaller Cultures all negative No antibiotics now and doesn't look infected Pain is still out of context for the visual injury---?nerve damage Will refill the dilaudid Daily wound care--Rx for supplies

## 2015-10-26 NOTE — Progress Notes (Signed)
Pre visit review using our clinic review tool, if applicable. No additional management support is needed unless otherwise documented below in the visit note.

## 2015-10-26 NOTE — Progress Notes (Signed)
Subjective:    Patient ID: [name], male    DOB: [birth year], 55 y.o.   MRN: [id number]  HPI Here for hospital follow up Reviewed cultures (all negative) Pain is still burning and can be severe It did seem to be some better after the surgical drainage of the hematoma Has had some relief with the dilaudid --but has needed it every 4 hours  Has nurse 3 days per week--wife doing the other days Wound packing then dry dressing  No fever No sweats or chills No antibiotics now Has surgical follow up on 8/18  Current Outpatient Prescriptions on File Prior to Visit  Medication Sig Dispense Refill  . amLODipine (NORVASC) 5 MG tablet Take 1 tablet (5 mg total) by mouth daily. 90 tablet 3  . buPROPion (WELLBUTRIN) 75 MG tablet Take 1 tablet (75 mg total) by mouth 2 (two) times daily. 180 tablet 3  . ELIQUIS 5 MG TABS tablet TAKE 1 TABLET BY MOUTH 2 TIMES DAILY. 60 tablet 3  . furosemide (LASIX) 40 MG tablet Take 40 mg by mouth daily. Every other day    . HYDROcodone-acetaminophen (NORCO/VICODIN) 5-325 MG tablet Take 1 tablet by mouth every 8 (eight) hours as needed for moderate pain. (Patient taking differently: Take 1 tablet by mouth every 4 (four) hours as needed for moderate pain. ) 30 tablet 0  . HYDROmorphone (DILAUDID) 2 MG tablet Take 1 tablet (2 mg total) by mouth every 8 (eight) hours as needed for severe pain. 25 tablet 0  . insulin aspart (NOVOLOG FLEXPEN) 100 UNIT/ML FlexPen Inject 1-5 Units into the skin 3 (three) times daily with meals as needed for high blood sugar. Pt uses as needed per sliding scale.    . insulin glargine (LANTUS) 100 unit/mL SOPN Inject 20 Units into the skin at bedtime.    Marland Kitchen lisinopril (PRINIVIL,ZESTRIL) 40 MG tablet Take 40 mg by mouth daily.    . metolazone (ZAROXOLYN) 2.5 MG tablet Take 2.5 mg by mouth daily as needed (for edema).     . mometasone (NASONEX) 50 MCG/ACT nasal spray Place 2 sprays into both nostrils at bedtime.    . Multiple Vitamin  (MULTIVITAMIN WITH MINERALS) TABS tablet Take 1 tablet by mouth daily.    . mupirocin cream (BACTROBAN) 2 % Apply topically daily. 15 g 0  . omeprazole (PRILOSEC) 20 MG capsule Take 20 mg by mouth daily.    . potassium chloride (K-DUR,KLOR-CON) 10 MEQ tablet Take 30 mEq by mouth daily.    . tacrolimus (PROGRAF) 0.5 MG capsule Take 0.5-1 mg by mouth 2 (two) times daily. 1 mg qam, 0.5 mg qpm    . valGANciclovir (VALCYTE) 450 MG tablet Take 2 tablets (900 mg total) by mouth 2 (two) times daily. 30 tablet 0   No current facility-administered medications on file prior to visit.     Allergies  Allergen Reactions  . Morphine Itching    Past Medical History:  Diagnosis Date  . Amnestic MCI (mild cognitive impairment with memory loss)   . Asthma   . Atrial fibrillation ([place])   . Atrial fibrillation ([place]) 2016  . Benign neoplasm of colon   . CAD (coronary artery disease) 2005   had CABG  . CHF (congestive heart failure) ([place])   . Chronic venous stasis dermatitis of both lower extremities   . Depression   . Diabetes mellitus   . Diverticulosis   . Dyspnea   . Erectile dysfunction   . ESRD (end stage renal  disease) ([place]) 2005   Dialysis then renal transplant  . GERD (gastroesophageal reflux disease)   . Gout   . Hyperlipidemia    low since renal failure  . Hypertension   . Kidney transplant status, cadaveric 2012  . Obesity   . Pneumonia   . Purpura ([place])   . Sleep apnea    bipap with oxygen 2 l  . Trigger finger   . Wears glasses     Past Surgical History:  Procedure Laterality Date  . BARIATRIC SURGERY    . CARDIAC CATHETERIZATION     [facility]  . COLONOSCOPY WITH PROPOFOL N/A 04/22/2013   Procedure: COLONOSCOPY WITH PROPOFOL;  Surgeon: [name], MD;  Location: [facility];  Service: Endoscopy;  Laterality: N/A;  . CORONARY ARTERY BYPASS GRAFT  2005   X 4  . DG ANGIO AV SHUNT*L*     right and left upper arms  . FASCIOTOMY  03/03/2012   Procedure: FASCIOTOMY;   Surgeon: [name], MD;  Location: [facility];  Service: Orthopedics;  Laterality: Right;  FASCIOTOMY RIGHT SMALL FINGER  . FASCIOTOMY Left 08/17/2013   Procedure: FASCIOTOMY LEFT RING;  Surgeon: [name], MD;  Location: [facility];  Service: Orthopedics;  Laterality: Left;  . INCISION AND DRAINAGE ABSCESS Left 10/15/2015   Procedure: INCISION AND DRAINAGE ABSCESS;  Surgeon: [name], MD;  Location: [facility];  Service: General;  Laterality: Left;  . KIDNEY TRANSPLANT  09/13/2010   cadaver--at [place]  1/12   left  . VASECTOMY      Family History  Problem Relation Age of Onset  . Heart disease Father   . Kidney failure Father   . Kidney disease Father   . Diabetes Maternal Grandmother   . Breast cancer Maternal Grandmother   . Liver cancer Paternal Uncle   . Liver cancer Paternal Grandmother   . Prostate cancer Neg Hx     Social History   Social History  . Marital status: Married    Spouse name: N/A  . Number of children: 2  . Years of education: N/A   Occupational History  . 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 Smoking status: Former Smoker    Types: Cigars    Quit date: 09/02/1994  . Smokeless tobacco: Never Used     Comment: cigar- once in a while  . Alcohol use 0.0 - 0.6 oz/week     Comment: occasional- 3 in the past year  . Drug use: No  . Sexual activity: Not on file   Other Topics Concern  . Not on file   Social History Narrative   In school for culinary arts--- will finish 12/16      Has living will   Wife is health care POA   Would accept resuscitation attempts   Hasn't considered tube feedings   Review of Systems  Appetite is fine No N/V     Objective:   Physical Exam  Skin:  Left calf wound markedly better ~8cm top to bottom Almost all granulated except at area of hematoma removal--- this is packed (~5-45m in  depth) No inflammation          Assessment & Plan:

## 2015-10-27 DIAGNOSIS — I872 Venous insufficiency (chronic) (peripheral): Secondary | ICD-10-CM | POA: Diagnosis not present

## 2015-10-27 DIAGNOSIS — Z48817 Encounter for surgical aftercare following surgery on the skin and subcutaneous tissue: Secondary | ICD-10-CM | POA: Diagnosis not present

## 2015-10-27 DIAGNOSIS — S81802D Unspecified open wound, left lower leg, subsequent encounter: Secondary | ICD-10-CM | POA: Diagnosis not present

## 2015-10-27 DIAGNOSIS — S81802A Unspecified open wound, left lower leg, initial encounter: Secondary | ICD-10-CM | POA: Diagnosis not present

## 2015-10-27 DIAGNOSIS — Z794 Long term (current) use of insulin: Secondary | ICD-10-CM | POA: Diagnosis not present

## 2015-10-27 DIAGNOSIS — I10 Essential (primary) hypertension: Secondary | ICD-10-CM | POA: Diagnosis not present

## 2015-10-27 DIAGNOSIS — Z940 Kidney transplant status: Secondary | ICD-10-CM | POA: Diagnosis not present

## 2015-10-27 DIAGNOSIS — Z7951 Long term (current) use of inhaled steroids: Secondary | ICD-10-CM | POA: Diagnosis not present

## 2015-10-27 DIAGNOSIS — G4733 Obstructive sleep apnea (adult) (pediatric): Secondary | ICD-10-CM | POA: Diagnosis not present

## 2015-10-27 DIAGNOSIS — S8012XD Contusion of left lower leg, subsequent encounter: Secondary | ICD-10-CM | POA: Diagnosis not present

## 2015-10-27 DIAGNOSIS — E119 Type 2 diabetes mellitus without complications: Secondary | ICD-10-CM | POA: Diagnosis not present

## 2015-10-31 ENCOUNTER — Ambulatory Visit: Payer: 59 | Admitting: Internal Medicine

## 2015-10-31 DIAGNOSIS — S81802D Unspecified open wound, left lower leg, subsequent encounter: Secondary | ICD-10-CM | POA: Diagnosis not present

## 2015-10-31 DIAGNOSIS — E119 Type 2 diabetes mellitus without complications: Secondary | ICD-10-CM | POA: Diagnosis not present

## 2015-10-31 DIAGNOSIS — Z48817 Encounter for surgical aftercare following surgery on the skin and subcutaneous tissue: Secondary | ICD-10-CM | POA: Diagnosis not present

## 2015-11-01 DIAGNOSIS — G4733 Obstructive sleep apnea (adult) (pediatric): Secondary | ICD-10-CM | POA: Diagnosis not present

## 2015-11-01 DIAGNOSIS — Z940 Kidney transplant status: Secondary | ICD-10-CM | POA: Diagnosis not present

## 2015-11-01 DIAGNOSIS — S8012XD Contusion of left lower leg, subsequent encounter: Secondary | ICD-10-CM | POA: Diagnosis not present

## 2015-11-01 DIAGNOSIS — Z7951 Long term (current) use of inhaled steroids: Secondary | ICD-10-CM | POA: Diagnosis not present

## 2015-11-01 DIAGNOSIS — Z794 Long term (current) use of insulin: Secondary | ICD-10-CM | POA: Diagnosis not present

## 2015-11-01 DIAGNOSIS — S81802D Unspecified open wound, left lower leg, subsequent encounter: Secondary | ICD-10-CM | POA: Diagnosis not present

## 2015-11-01 DIAGNOSIS — Z48817 Encounter for surgical aftercare following surgery on the skin and subcutaneous tissue: Secondary | ICD-10-CM | POA: Diagnosis not present

## 2015-11-01 DIAGNOSIS — E119 Type 2 diabetes mellitus without complications: Secondary | ICD-10-CM | POA: Diagnosis not present

## 2015-11-01 DIAGNOSIS — I872 Venous insufficiency (chronic) (peripheral): Secondary | ICD-10-CM | POA: Diagnosis not present

## 2015-11-01 DIAGNOSIS — I10 Essential (primary) hypertension: Secondary | ICD-10-CM | POA: Diagnosis not present

## 2015-11-03 DIAGNOSIS — I2510 Atherosclerotic heart disease of native coronary artery without angina pectoris: Secondary | ICD-10-CM | POA: Diagnosis not present

## 2015-11-03 DIAGNOSIS — N186 End stage renal disease: Secondary | ICD-10-CM | POA: Diagnosis not present

## 2015-11-03 DIAGNOSIS — D899 Disorder involving the immune mechanism, unspecified: Secondary | ICD-10-CM | POA: Diagnosis not present

## 2015-11-03 DIAGNOSIS — B259 Cytomegaloviral disease, unspecified: Secondary | ICD-10-CM | POA: Diagnosis not present

## 2015-11-03 DIAGNOSIS — E8770 Fluid overload, unspecified: Secondary | ICD-10-CM | POA: Diagnosis not present

## 2015-11-03 DIAGNOSIS — I4891 Unspecified atrial fibrillation: Secondary | ICD-10-CM | POA: Diagnosis not present

## 2015-11-03 DIAGNOSIS — E1122 Type 2 diabetes mellitus with diabetic chronic kidney disease: Secondary | ICD-10-CM | POA: Diagnosis not present

## 2015-11-03 DIAGNOSIS — I10 Essential (primary) hypertension: Secondary | ICD-10-CM | POA: Diagnosis not present

## 2015-11-03 DIAGNOSIS — Z6837 Body mass index (BMI) 37.0-37.9, adult: Secondary | ICD-10-CM | POA: Diagnosis not present

## 2015-11-03 DIAGNOSIS — R609 Edema, unspecified: Secondary | ICD-10-CM | POA: Diagnosis not present

## 2015-11-03 DIAGNOSIS — E662 Morbid (severe) obesity with alveolar hypoventilation: Secondary | ICD-10-CM | POA: Diagnosis not present

## 2015-11-03 DIAGNOSIS — Z9889 Other specified postprocedural states: Secondary | ICD-10-CM | POA: Diagnosis not present

## 2015-11-03 DIAGNOSIS — E876 Hypokalemia: Secondary | ICD-10-CM | POA: Diagnosis not present

## 2015-11-03 DIAGNOSIS — I482 Chronic atrial fibrillation: Secondary | ICD-10-CM | POA: Diagnosis not present

## 2015-11-03 DIAGNOSIS — I120 Hypertensive chronic kidney disease with stage 5 chronic kidney disease or end stage renal disease: Secondary | ICD-10-CM | POA: Diagnosis not present

## 2015-11-03 DIAGNOSIS — Z7902 Long term (current) use of antithrombotics/antiplatelets: Secondary | ICD-10-CM | POA: Diagnosis not present

## 2015-11-03 DIAGNOSIS — E119 Type 2 diabetes mellitus without complications: Secondary | ICD-10-CM | POA: Diagnosis not present

## 2015-11-03 DIAGNOSIS — Z794 Long term (current) use of insulin: Secondary | ICD-10-CM | POA: Diagnosis not present

## 2015-11-03 DIAGNOSIS — L0390 Cellulitis, unspecified: Secondary | ICD-10-CM | POA: Diagnosis not present

## 2015-11-03 DIAGNOSIS — D8989 Other specified disorders involving the immune mechanism, not elsewhere classified: Secondary | ICD-10-CM | POA: Diagnosis not present

## 2015-11-03 DIAGNOSIS — Z79899 Other long term (current) drug therapy: Secondary | ICD-10-CM | POA: Diagnosis not present

## 2015-11-03 DIAGNOSIS — Z7982 Long term (current) use of aspirin: Secondary | ICD-10-CM | POA: Diagnosis not present

## 2015-11-03 DIAGNOSIS — G4733 Obstructive sleep apnea (adult) (pediatric): Secondary | ICD-10-CM | POA: Diagnosis not present

## 2015-11-03 DIAGNOSIS — Z4822 Encounter for aftercare following kidney transplant: Secondary | ICD-10-CM | POA: Diagnosis not present

## 2015-11-03 DIAGNOSIS — Z9989 Dependence on other enabling machines and devices: Secondary | ICD-10-CM | POA: Diagnosis not present

## 2015-11-03 DIAGNOSIS — Z940 Kidney transplant status: Secondary | ICD-10-CM | POA: Diagnosis not present

## 2015-11-08 ENCOUNTER — Ambulatory Visit (INDEPENDENT_AMBULATORY_CARE_PROVIDER_SITE_OTHER): Payer: 59 | Admitting: Surgery

## 2015-11-08 ENCOUNTER — Encounter: Payer: Self-pay | Admitting: Surgery

## 2015-11-08 VITALS — BP 143/71 | HR 67 | Temp 98.3°F | Ht 71.0 in | Wt 264.0 lb

## 2015-11-08 DIAGNOSIS — T8189XA Other complications of procedures, not elsewhere classified, initial encounter: Secondary | ICD-10-CM

## 2015-11-08 NOTE — Patient Instructions (Addendum)
Please continue to do your daily change of dressings. Remember to do wet to dry. Please do not apply anything to it just [place].  Please give [name] a call if you have any questions or concerns.  Tenga un buen dia!

## 2015-11-08 NOTE — Progress Notes (Signed)
Outpatient Surgical Follow Up  11/08/2015  [name] is an 55 y.o. male.   CC: Left leg wound  HPI: This a patient who is anticoagulated and bumped his leg resulting in a wound as well as a hematoma. Dr. Perrin Maltese took the patient to the operating room and drain the hematoma and he's been on wound care at home. He has no complaints at this time with the exceptions of some occasional shooting pain through the wound. He is diabetic and a chronic renal patient.  Past Medical History:  Diagnosis Date  . Amnestic MCI (mild cognitive impairment with memory loss)   . Asthma   . Atrial fibrillation ([place])   . Atrial fibrillation ([place]) 2016  . Benign neoplasm of colon   . CAD (coronary artery disease) 2005   had CABG  . CHF (congestive heart failure) ([place])   . Chronic venous stasis dermatitis of both lower extremities   . Depression   . Diabetes mellitus   . Diverticulosis   . Dyspnea   . Erectile dysfunction   . ESRD (end stage renal disease) ([place]) 2005   Dialysis then renal transplant  . GERD (gastroesophageal reflux disease)   . Gout   . Hyperlipidemia    low since renal failure  . Hypertension   . Kidney transplant status, cadaveric 2012  . Obesity   . Pneumonia   . Purpura ([place])   . Sleep apnea    bipap with oxygen 2 l  . Trigger finger   . Wears glasses     Past Surgical History:  Procedure Laterality Date  . BARIATRIC SURGERY    . CARDIAC CATHETERIZATION     [facility]  . COLONOSCOPY WITH PROPOFOL N/A 04/22/2013   Procedure: COLONOSCOPY WITH PROPOFOL;  Surgeon: [name], MD;  [facility];  Service: Endoscopy;  Laterality: N/A;  . CORONARY ARTERY BYPASS GRAFT  2005   X 4  . DG ANGIO AV SHUNT*L*     right and left upper arms  . FASCIOTOMY  03/03/2012   Procedure: FASCIOTOMY;  Surgeon: [name], MD;  Location: [place];  Service: Orthopedics;  Laterality: Right;  FASCIOTOMY RIGHT SMALL FINGER  . FASCIOTOMY Left 08/17/2013   Procedure:  FASCIOTOMY LEFT RING;  Surgeon: [name], MD;  Location: [place];  Service: Orthopedics;  Laterality: Left;  . INCISION AND DRAINAGE ABSCESS Left 10/15/2015   Procedure: INCISION AND DRAINAGE ABSCESS;  Surgeon: [name], MD;  Location: [facility];  Service: General;  Laterality: Left;  . KIDNEY TRANSPLANT  09/13/2010   cadaver--at [place]  1/12   left  . VASECTOMY      Family History  Problem Relation Age of Onset  . Heart disease Father   . Kidney failure Father   . Kidney disease Father   . Diabetes Maternal Grandmother   . Breast cancer Maternal Grandmother   . Liver cancer Paternal Uncle   . Liver cancer Paternal Grandmother   . Prostate cancer Neg Hx     Social History:  reports that he quit smoking about 21 years ago. His smoking use included Cigars. He has never used smokeless tobacco. He reports that he drinks alcohol. He reports that he does not use drugs.  Allergies:  Allergies  Allergen Reactions  . Morphine Itching    Medications reviewed.   Review of Systems:   Review of Systems  Constitutional: Negative.   Musculoskeletal: Negative.  Skin: Negative.   Endo/Heme/Allergies: Negative.      Physical Exam:  BP (!) 143/71 (BP Location: Right Arm, Patient Position: Sitting, Cuff Size: Large)   Pulse 67   Temp 98.3 F (36.8 C) (Oral)   Ht 5' 11" (1.803 m)   Wt 264 lb (119.7 kg)   BMI 36.82 kg/m   Physical Exam  Constitutional: He is oriented to person, place, and time and well-developed, well-nourished, and in no distress. No distress.  HENT:  Head: Normocephalic and atraumatic.  Musculoskeletal: He exhibits no edema or tenderness.  Upper extremity scars from dialysis catheter placement  Left lower extremity anterior shin wound with granulation tissue and no erythema no drainage no purulence  Treated the edges with silver nitrate.  Neurological: He is alert and oriented to person, place, and  time.  Skin: Skin is warm and dry. No rash noted. He is not diaphoretic. No erythema.  Vitals reviewed.     No results found for this or any previous visit (from the past 48 hour(s)). No results found.  Assessment/Plan:  Wound is doing well but with his diabetes and the location of this it may take several weeks to months for this to heal I vascular come back and see [name] once a week for wound care and I treated the edges today with silver nitrate.  Florene Glen, MD, FACS

## 2015-11-09 DIAGNOSIS — Z940 Kidney transplant status: Secondary | ICD-10-CM | POA: Diagnosis not present

## 2015-11-10 DIAGNOSIS — S81802D Unspecified open wound, left lower leg, subsequent encounter: Secondary | ICD-10-CM | POA: Diagnosis not present

## 2015-11-10 DIAGNOSIS — Z794 Long term (current) use of insulin: Secondary | ICD-10-CM | POA: Diagnosis not present

## 2015-11-10 DIAGNOSIS — I10 Essential (primary) hypertension: Secondary | ICD-10-CM | POA: Diagnosis not present

## 2015-11-10 DIAGNOSIS — S8012XD Contusion of left lower leg, subsequent encounter: Secondary | ICD-10-CM | POA: Diagnosis not present

## 2015-11-10 DIAGNOSIS — Z7951 Long term (current) use of inhaled steroids: Secondary | ICD-10-CM | POA: Diagnosis not present

## 2015-11-10 DIAGNOSIS — Z48817 Encounter for surgical aftercare following surgery on the skin and subcutaneous tissue: Secondary | ICD-10-CM | POA: Diagnosis not present

## 2015-11-10 DIAGNOSIS — I872 Venous insufficiency (chronic) (peripheral): Secondary | ICD-10-CM | POA: Diagnosis not present

## 2015-11-10 DIAGNOSIS — G4733 Obstructive sleep apnea (adult) (pediatric): Secondary | ICD-10-CM | POA: Diagnosis not present

## 2015-11-10 DIAGNOSIS — E119 Type 2 diabetes mellitus without complications: Secondary | ICD-10-CM | POA: Diagnosis not present

## 2015-11-10 DIAGNOSIS — Z940 Kidney transplant status: Secondary | ICD-10-CM | POA: Diagnosis not present

## 2015-11-14 DIAGNOSIS — E662 Morbid (severe) obesity with alveolar hypoventilation: Secondary | ICD-10-CM | POA: Diagnosis not present

## 2015-11-14 DIAGNOSIS — I482 Chronic atrial fibrillation: Secondary | ICD-10-CM | POA: Diagnosis not present

## 2015-11-14 DIAGNOSIS — L0390 Cellulitis, unspecified: Secondary | ICD-10-CM | POA: Diagnosis not present

## 2015-11-14 DIAGNOSIS — G4733 Obstructive sleep apnea (adult) (pediatric): Secondary | ICD-10-CM | POA: Diagnosis not present

## 2015-11-16 DIAGNOSIS — I10 Essential (primary) hypertension: Secondary | ICD-10-CM | POA: Diagnosis not present

## 2015-11-16 DIAGNOSIS — E119 Type 2 diabetes mellitus without complications: Secondary | ICD-10-CM | POA: Diagnosis not present

## 2015-11-16 DIAGNOSIS — E874 Mixed disorder of acid-base balance: Secondary | ICD-10-CM | POA: Diagnosis not present

## 2015-11-16 DIAGNOSIS — Z4822 Encounter for aftercare following kidney transplant: Secondary | ICD-10-CM | POA: Diagnosis not present

## 2015-11-16 DIAGNOSIS — E1122 Type 2 diabetes mellitus with diabetic chronic kidney disease: Secondary | ICD-10-CM | POA: Diagnosis not present

## 2015-11-16 DIAGNOSIS — Z7901 Long term (current) use of anticoagulants: Secondary | ICD-10-CM | POA: Diagnosis not present

## 2015-11-16 DIAGNOSIS — Z79899 Other long term (current) drug therapy: Secondary | ICD-10-CM | POA: Diagnosis not present

## 2015-11-16 DIAGNOSIS — I4891 Unspecified atrial fibrillation: Secondary | ICD-10-CM | POA: Diagnosis not present

## 2015-11-16 DIAGNOSIS — E876 Hypokalemia: Secondary | ICD-10-CM | POA: Diagnosis not present

## 2015-11-16 DIAGNOSIS — B259 Cytomegaloviral disease, unspecified: Secondary | ICD-10-CM | POA: Diagnosis not present

## 2015-11-16 DIAGNOSIS — G4733 Obstructive sleep apnea (adult) (pediatric): Secondary | ICD-10-CM | POA: Diagnosis not present

## 2015-11-16 DIAGNOSIS — R809 Proteinuria, unspecified: Secondary | ICD-10-CM | POA: Diagnosis not present

## 2015-11-16 DIAGNOSIS — Z9989 Dependence on other enabling machines and devices: Secondary | ICD-10-CM | POA: Diagnosis not present

## 2015-11-16 DIAGNOSIS — R609 Edema, unspecified: Secondary | ICD-10-CM | POA: Diagnosis not present

## 2015-11-16 DIAGNOSIS — N186 End stage renal disease: Secondary | ICD-10-CM | POA: Diagnosis not present

## 2015-11-16 DIAGNOSIS — Z940 Kidney transplant status: Secondary | ICD-10-CM | POA: Diagnosis not present

## 2015-11-16 DIAGNOSIS — D899 Disorder involving the immune mechanism, unspecified: Secondary | ICD-10-CM | POA: Diagnosis not present

## 2015-11-16 DIAGNOSIS — I120 Hypertensive chronic kidney disease with stage 5 chronic kidney disease or end stage renal disease: Secondary | ICD-10-CM | POA: Diagnosis not present

## 2015-11-16 DIAGNOSIS — B258 Other cytomegaloviral diseases: Secondary | ICD-10-CM | POA: Diagnosis not present

## 2015-11-17 DIAGNOSIS — S81802D Unspecified open wound, left lower leg, subsequent encounter: Secondary | ICD-10-CM | POA: Diagnosis not present

## 2015-11-17 DIAGNOSIS — Z794 Long term (current) use of insulin: Secondary | ICD-10-CM | POA: Diagnosis not present

## 2015-11-17 DIAGNOSIS — G4733 Obstructive sleep apnea (adult) (pediatric): Secondary | ICD-10-CM | POA: Diagnosis not present

## 2015-11-17 DIAGNOSIS — E119 Type 2 diabetes mellitus without complications: Secondary | ICD-10-CM | POA: Diagnosis not present

## 2015-11-17 DIAGNOSIS — Z940 Kidney transplant status: Secondary | ICD-10-CM | POA: Diagnosis not present

## 2015-11-17 DIAGNOSIS — I872 Venous insufficiency (chronic) (peripheral): Secondary | ICD-10-CM | POA: Diagnosis not present

## 2015-11-17 DIAGNOSIS — S8012XD Contusion of left lower leg, subsequent encounter: Secondary | ICD-10-CM | POA: Diagnosis not present

## 2015-11-17 DIAGNOSIS — Z48817 Encounter for surgical aftercare following surgery on the skin and subcutaneous tissue: Secondary | ICD-10-CM | POA: Diagnosis not present

## 2015-11-17 DIAGNOSIS — I10 Essential (primary) hypertension: Secondary | ICD-10-CM | POA: Diagnosis not present

## 2015-11-17 DIAGNOSIS — Z7951 Long term (current) use of inhaled steroids: Secondary | ICD-10-CM | POA: Diagnosis not present

## 2015-11-20 ENCOUNTER — Other Ambulatory Visit: Payer: Self-pay | Admitting: Internal Medicine

## 2015-11-20 ENCOUNTER — Other Ambulatory Visit: Payer: Self-pay | Admitting: Cardiovascular Disease

## 2015-11-20 DIAGNOSIS — Z794 Long term (current) use of insulin: Secondary | ICD-10-CM | POA: Diagnosis not present

## 2015-11-20 DIAGNOSIS — S8012XD Contusion of left lower leg, subsequent encounter: Secondary | ICD-10-CM | POA: Diagnosis not present

## 2015-11-20 DIAGNOSIS — Z9884 Bariatric surgery status: Secondary | ICD-10-CM | POA: Diagnosis not present

## 2015-11-20 DIAGNOSIS — E6609 Other obesity due to excess calories: Secondary | ICD-10-CM | POA: Diagnosis not present

## 2015-11-20 DIAGNOSIS — E1165 Type 2 diabetes mellitus with hyperglycemia: Secondary | ICD-10-CM | POA: Diagnosis not present

## 2015-11-21 ENCOUNTER — Other Ambulatory Visit: Payer: Self-pay

## 2015-11-22 DIAGNOSIS — Z7951 Long term (current) use of inhaled steroids: Secondary | ICD-10-CM | POA: Diagnosis not present

## 2015-11-22 DIAGNOSIS — E119 Type 2 diabetes mellitus without complications: Secondary | ICD-10-CM | POA: Diagnosis not present

## 2015-11-22 DIAGNOSIS — Z940 Kidney transplant status: Secondary | ICD-10-CM | POA: Diagnosis not present

## 2015-11-22 DIAGNOSIS — S8012XD Contusion of left lower leg, subsequent encounter: Secondary | ICD-10-CM | POA: Diagnosis not present

## 2015-11-22 DIAGNOSIS — I872 Venous insufficiency (chronic) (peripheral): Secondary | ICD-10-CM | POA: Diagnosis not present

## 2015-11-22 DIAGNOSIS — Z48817 Encounter for surgical aftercare following surgery on the skin and subcutaneous tissue: Secondary | ICD-10-CM | POA: Diagnosis not present

## 2015-11-22 DIAGNOSIS — I10 Essential (primary) hypertension: Secondary | ICD-10-CM | POA: Diagnosis not present

## 2015-11-22 DIAGNOSIS — S81802D Unspecified open wound, left lower leg, subsequent encounter: Secondary | ICD-10-CM | POA: Diagnosis not present

## 2015-11-22 DIAGNOSIS — Z794 Long term (current) use of insulin: Secondary | ICD-10-CM | POA: Diagnosis not present

## 2015-11-22 DIAGNOSIS — G4733 Obstructive sleep apnea (adult) (pediatric): Secondary | ICD-10-CM | POA: Diagnosis not present

## 2015-11-23 ENCOUNTER — Ambulatory Visit (INDEPENDENT_AMBULATORY_CARE_PROVIDER_SITE_OTHER): Payer: Medicare Other | Admitting: Surgery

## 2015-11-23 ENCOUNTER — Encounter: Payer: Self-pay | Admitting: Surgery

## 2015-11-23 VITALS — BP 167/80 | HR 79 | Temp 97.8°F | Ht 71.0 in | Wt 270.2 lb

## 2015-11-23 DIAGNOSIS — S81802D Unspecified open wound, left lower leg, subsequent encounter: Secondary | ICD-10-CM

## 2015-11-23 NOTE — Progress Notes (Signed)
S/p I/D and evacuation of infected left leg hematoma on a Diabetic pt w k transplant and immunosuppression Currently Vaseline gauze dressing  PE NAD Wound 4 cm x 1.4 cm elliptical wound, some fibrinous material at the base w good granulation tissues  A/P expect prolonged wound care given his immunosuppression and DM Wet/dry daily RTC 3 weeks Offered wound care consultation but wishes to f/u in a few weeks w [name]

## 2015-11-23 NOTE — Patient Instructions (Signed)
Please continue the wet to dry dressing changes twice daily until you are seen back in the office. Make sure that the wet guaze is just damp and it is only placed inside the wound bed, not on the normal skin around this. The wound may bleed when the guaze is pulled off, this is expected as it is pulling the old tissue and cleaning the wound bed. This is how this works to help heal! If you have severe pain, you may wet the guaze with a tiny bit of saline to help the pain.  We will have you follow-up in office in 3 weeks to see how you are progressing.  Please call with any questions or concerns prior to your next scheduled appointment.

## 2015-11-26 DIAGNOSIS — E662 Morbid (severe) obesity with alveolar hypoventilation: Secondary | ICD-10-CM | POA: Diagnosis not present

## 2015-11-26 DIAGNOSIS — G4733 Obstructive sleep apnea (adult) (pediatric): Secondary | ICD-10-CM | POA: Diagnosis not present

## 2015-11-28 ENCOUNTER — Other Ambulatory Visit: Payer: Self-pay | Admitting: Internal Medicine

## 2015-11-29 ENCOUNTER — Other Ambulatory Visit: Payer: Self-pay

## 2015-11-29 NOTE — Patient Outreach (Signed)
[facility]) Care Management  11/29/2015  ROME ECHAVARRIA [birth year] [id number]   Called patient today for Link to Wellness follow up.  I was unable to reach him by phone so I have left a message to have him return my call.    [name], RN, BA, [place], [place] Direct Dial:  [phone]  Fax:  [phone] E-mail: [name] [address], [place], [place]  [address]

## 2015-11-29 NOTE — Patient Outreach (Signed)
[facility]) Care Management  11/29/2015  [name] 07-28-1960 [id number]   Spoke to patient by phone this morning. He has been admitted to the hospital since the last time I saw him, has seen Dr. Gabriel Carina and has been to see a general surgeon.  His left shin wound remains open and he is dressing it once a day.  A nurse assesses it once a week. He remains mobile but has limitations in the amount of time he can stand because it is painful. He is not taking any  Pain medications because the Dilaudid makes him constipated, he can't take ibuprofen and Tylenol "doesn't work".   CBG OK- reports fasting CBG 98-190m/dl and 2 hour post prandial 130-1858mdl.  He is not having any hypoglycemia but was having some when he was on the Dilaudid.  Tells me he's eating 6-7 times per day.  Follow up appointment with Dr. SoGabriel Carinan October 2017.  [facility] Care Plan Problem Two   Flowsheet Row Most Recent Value  Care Plan Problem Two  Potential for elevated A1C > 7%  Role Documenting the Problem Two  Care Management Coordinator  Care Plan for Problem Two  Active  Interventions for Problem Two Long Term Goal   reviewed the importance of ideal blood sugar control for wound healing 2. reviewed MD orders for dressing change 2x/day pre MD note   [facility] Long Term Goal (31-90) days  Patient will maintain A1C less than 7%  [facility] Long Term Goal Start Date  11/29/15     I will follow up with Norah in a few weeks.    JuGentry FitzRN, BA, MHLake ShoreCDWare Placeirect Dial:  [phone]:  [phone]-mail: [email or web address] [address]  [id number]

## 2015-11-30 DIAGNOSIS — Z794 Long term (current) use of insulin: Secondary | ICD-10-CM | POA: Diagnosis not present

## 2015-11-30 DIAGNOSIS — Z940 Kidney transplant status: Secondary | ICD-10-CM | POA: Diagnosis not present

## 2015-11-30 DIAGNOSIS — E119 Type 2 diabetes mellitus without complications: Secondary | ICD-10-CM | POA: Diagnosis not present

## 2015-11-30 DIAGNOSIS — Z48817 Encounter for surgical aftercare following surgery on the skin and subcutaneous tissue: Secondary | ICD-10-CM | POA: Diagnosis not present

## 2015-11-30 DIAGNOSIS — G4733 Obstructive sleep apnea (adult) (pediatric): Secondary | ICD-10-CM | POA: Diagnosis not present

## 2015-11-30 DIAGNOSIS — I10 Essential (primary) hypertension: Secondary | ICD-10-CM | POA: Diagnosis not present

## 2015-11-30 DIAGNOSIS — S8012XD Contusion of left lower leg, subsequent encounter: Secondary | ICD-10-CM | POA: Diagnosis not present

## 2015-11-30 DIAGNOSIS — S81802D Unspecified open wound, left lower leg, subsequent encounter: Secondary | ICD-10-CM | POA: Diagnosis not present

## 2015-11-30 DIAGNOSIS — I872 Venous insufficiency (chronic) (peripheral): Secondary | ICD-10-CM | POA: Diagnosis not present

## 2015-11-30 DIAGNOSIS — Z7951 Long term (current) use of inhaled steroids: Secondary | ICD-10-CM | POA: Diagnosis not present

## 2015-12-01 DIAGNOSIS — G4733 Obstructive sleep apnea (adult) (pediatric): Secondary | ICD-10-CM | POA: Diagnosis not present

## 2015-12-05 ENCOUNTER — Encounter: Payer: Self-pay | Admitting: Internal Medicine

## 2015-12-05 ENCOUNTER — Ambulatory Visit (INDEPENDENT_AMBULATORY_CARE_PROVIDER_SITE_OTHER): Payer: 59 | Admitting: Internal Medicine

## 2015-12-05 VITALS — BP 142/80 | HR 64 | Temp 97.5°F | Ht 69.5 in | Wt 281.0 lb

## 2015-12-05 DIAGNOSIS — E1121 Type 2 diabetes mellitus with diabetic nephropathy: Secondary | ICD-10-CM | POA: Diagnosis not present

## 2015-12-05 DIAGNOSIS — F3340 Major depressive disorder, recurrent, in remission, unspecified: Secondary | ICD-10-CM

## 2015-12-05 DIAGNOSIS — I480 Paroxysmal atrial fibrillation: Secondary | ICD-10-CM

## 2015-12-05 DIAGNOSIS — I5022 Chronic systolic (congestive) heart failure: Secondary | ICD-10-CM | POA: Diagnosis not present

## 2015-12-05 DIAGNOSIS — Z23 Encounter for immunization: Secondary | ICD-10-CM

## 2015-12-05 DIAGNOSIS — Z794 Long term (current) use of insulin: Secondary | ICD-10-CM

## 2015-12-05 DIAGNOSIS — L97921 Non-pressure chronic ulcer of unspecified part of left lower leg limited to breakdown of skin: Secondary | ICD-10-CM

## 2015-12-05 DIAGNOSIS — Z0000 Encounter for general adult medical examination without abnormal findings: Secondary | ICD-10-CM | POA: Diagnosis not present

## 2015-12-05 LAB — HM DIABETES FOOT EXAM

## 2015-12-05 NOTE — Assessment & Plan Note (Signed)
Regular On eliquis

## 2015-12-05 NOTE — Addendum Note (Signed)
Addended by: Pilar Grammes on: 12/05/2015 10:34 AM   Modules accepted: Orders

## 2015-12-05 NOTE — Assessment & Plan Note (Signed)
occ brief crying, etc Basically still in remission

## 2015-12-05 NOTE — Assessment & Plan Note (Signed)
Doing okay Restarting his exercise program Flu vaccine today Colon negative in 2015 Defer PSA

## 2015-12-05 NOTE — Assessment & Plan Note (Signed)
Compensated No changes needed Uses the diuretic intermittently

## 2015-12-05 NOTE — Progress Notes (Signed)
Subjective:    Patient ID: [name], male    DOB: 1961-02-14, 55 y.o.   MRN: [id number]  HPI Here for physical  Left leg is getting better Ulcer much smaller--should be healed within the next 2-4 weeks Finally back to walking and light work in gym  Has some cough and post nasal drip--may just be allergies Needs to start his allergy meds No fever or SOB  Kidney seems to be okay  Recent A1c is good Continues with Dr Gabriel Carina Eye exam okay in May--[facility]  Current Outpatient Prescriptions on File Prior to Visit  Medication Sig Dispense Refill  . amLODipine (NORVASC) 5 MG tablet Take 1 tablet (5 mg total) by mouth daily. 90 tablet 3  . buPROPion (WELLBUTRIN) 75 MG tablet TAKE 1 TABLET BY MOUTH 2 TIMES DAILY. 180 tablet 0  . ELIQUIS 5 MG TABS tablet TAKE 1 TABLET BY MOUTH 2 TIMES DAILY. 60 tablet 3  . furosemide (LASIX) 40 MG tablet Take 40 mg by mouth daily. Every other day    . insulin aspart (NOVOLOG FLEXPEN) 100 UNIT/ML FlexPen Inject 5 Units into the skin 3 (three) times daily with meals as needed for high blood sugar. Pt uses as needed per sliding scale.    . insulin glargine (LANTUS) 100 unit/mL SOPN Inject 15 Units into the skin at bedtime.     Marland Kitchen lisinopril (PRINIVIL,ZESTRIL) 40 MG tablet TAKE 1 TABLET (40 MG TOTAL) BY MOUTH DAILY. 30 tablet 0  . metolazone (ZAROXOLYN) 2.5 MG tablet Take 2.5 mg by mouth daily as needed (for edema).     . mometasone (NASONEX) 50 MCG/ACT nasal spray Place 2 sprays into both nostrils at bedtime.    . Multiple Vitamin (MULTIVITAMIN WITH MINERALS) TABS tablet Take 1 tablet by mouth daily.    Marland Kitchen omeprazole (PRILOSEC) 20 MG capsule Take 20 mg by mouth daily.    . potassium chloride (K-DUR,KLOR-CON) 10 MEQ tablet Take 30 mEq by mouth daily.    . tacrolimus (PROGRAF) 0.5 MG capsule Take 0.5 mg by mouth 2 (two) times daily.     . valGANciclovir (VALCYTE) 450 MG tablet Take 1 tablet by mouth 2 (two) times daily.   3   No current  facility-administered medications on file prior to visit.     Allergies  Allergen Reactions  . Morphine Itching    Past Medical History:  Diagnosis Date  . Amnestic MCI (mild cognitive impairment with memory loss)   . Asthma   . Atrial fibrillation ([place])   . Atrial fibrillation ([place]) 2016  . Benign neoplasm of colon   . CAD (coronary artery disease) 2005   had CABG  . CHF (congestive heart failure) ([place])   . Chronic venous stasis dermatitis of both lower extremities   . Depression   . Diabetes mellitus   . Diverticulosis   . Dyspnea   . Erectile dysfunction   . ESRD (end stage renal disease) ([place]) 2005   Dialysis then renal transplant  . GERD (gastroesophageal reflux disease)   . Gout   . Hyperlipidemia    low since renal failure  . Hypertension   . Kidney transplant status, cadaveric 2012  . Obesity   . Pneumonia   . Purpura ([place])   . Sleep apnea    bipap with oxygen 2 l  . Trigger finger   . Wears glasses     Past Surgical History:  Procedure Laterality Date  . BARIATRIC SURGERY    . [facility]AC CATHE[facility]IZATION  [facility]  . COLONOSCOPY WITH PROPOFOL N/A 04/22/2013   Procedure: COLONOSCOPY WITH PROPOFOL;  Surgeon: [name], MD;  [facility];  Service: Endoscopy;  Laterality: N/A;  . CORONARY ARTERY BYPASS GRAFT  2005   X 4  . DG ANGIO AV SHUNT*L*     right and left upper arms  . FASCIOTOMY  03/03/2012   Procedure: FASCIOTOMY;  Surgeon: [name], MD;  Location: [place];  Service: Orthopedics;  Laterality: Right;  FASCIOTOMY RIGHT SMALL FINGER  . FASCIOTOMY Left 08/17/2013   Procedure: FASCIOTOMY LEFT RING;  Surgeon: [name], MD;  Location: [place];  Service: Orthopedics;  Laterality: Left;  . INCISION AND DRAINAGE ABSCESS Left 10/15/2015   Procedure: INCISION AND DRAINAGE ABSCESS;  Surgeon: [name], MD;  Location: [facility];  Service: General;  Laterality: Left;  . KIDNEY TRANSPLANT  09/13/2010    cadaver--at [place]  1/12   left  . VASECTOMY      Family History  Problem Relation Age of Onset  . Heart disease Father   . Kidney failure Father   . Kidney disease Father   . Diabetes Maternal Grandmother   . Breast cancer Maternal Grandmother   . Liver cancer Paternal Uncle   . Liver cancer Paternal Grandmother   . Prostate cancer Neg Hx     Social History   Social History  . Marital status: Married    Spouse name: N/A  . Number of children: 2  . Years of education: N/A   Occupational History  . 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 Smoking status: Former Smoker    Types: Cigars    Quit date: 09/02/1994  . Smokeless tobacco: Never Used     Comment: cigar- once in a while  . Alcohol use 0.0 - 0.6 oz/week     Comment: occasional- 3 in the past year  . Drug use: No  . Sexual activity: Not on file   Other Topics Concern  . Not on file   Social History Narrative   In school for culinary arts--- will finish 12/16      Has living will   Wife is health care POA   Would accept resuscitation attempts   Hasn't considered tube feedings      Review of Systems  Constitutional: Negative for fatigue.       Still has fluid changes Wears seat belt  HENT: Positive for hearing loss. Negative for dental problem and tinnitus.        Keeps up with dentist  Eyes: Negative for visual disturbance.       No diplopia or unilateral vision loss  Respiratory: Positive for cough. Negative for chest tightness and shortness of breath.   Cardiovascular: Positive for leg swelling. Negative for chest pain and palpitations.  Gastrointestinal: Negative for abdominal pain, blood in stool, constipation, nausea and vomiting.       Rectal tag--doesn't bother him  Endocrine: Negative for polydipsia and polyuria.  Genitourinary: Negative for difficulty urinating and urgency.       No sexual problems    Musculoskeletal: Negative for arthralgias, back pain and joint swelling.  Skin: Positive for wound. Negative for rash.  Allergic/Immunologic: Positive for environmental allergies. Negative for immunocompromised state.  Neurological: Negative for dizziness, syncope, light-headedness and headaches.  Hematological: Negative for adenopathy. Bruises/bleeds easily.  Psychiatric/Behavioral: Negative for sleep  disturbance. The patient is not nervous/anxious.        Still some mood issues--cries easy at times but no persistent symptoms       Objective:   Physical Exam  Constitutional: He appears well-developed and well-nourished. No distress.  HENT:  Head: Normocephalic and atraumatic.  Right Ear: External ear normal.  Left Ear: External ear normal.  Mouth/Throat: Oropharynx is clear and moist. No oropharyngeal exudate.  Eyes: Conjunctivae are normal. Pupils are equal, round, and reactive to light.  Neck: Normal range of motion. Neck supple. No thyromegaly present.  Cardiovascular: Normal rate, regular rhythm, normal heart sounds and intact distal pulses.  Exam reveals no gallop.   No murmur heard. Pulmonary/Chest: Effort normal and breath sounds normal. No respiratory distress. He has no wheezes. He has no rales.  Abdominal: Soft. There is no tenderness.  Musculoskeletal: He exhibits no edema or tenderness.  Lymphadenopathy:    He has no cervical adenopathy.  Neurological:  Normal sensation in feet  Skin:  Shallow ulcer now on left calf. Small amount of slough but not inflamed Venous stasis changes  Psychiatric: He has a normal mood and affect. His behavior is normal.          Assessment & Plan:

## 2015-12-05 NOTE — Assessment & Plan Note (Signed)
Continues to improve

## 2015-12-05 NOTE — Progress Notes (Signed)
Pre visit review using our clinic review tool, if applicable. No additional management support is needed unless otherwise documented below in the visit note.

## 2015-12-05 NOTE — Assessment & Plan Note (Signed)
Lab Results  Component Value Date   HGBA1C 7.3 (H) 10/12/2015   Good control Early retinopathy as well

## 2015-12-07 ENCOUNTER — Ambulatory Visit: Payer: Self-pay | Admitting: Surgery

## 2015-12-07 ENCOUNTER — Ambulatory Visit (INDEPENDENT_AMBULATORY_CARE_PROVIDER_SITE_OTHER): Payer: Medicare Other | Admitting: General Surgery

## 2015-12-07 ENCOUNTER — Encounter: Payer: Self-pay | Admitting: General Surgery

## 2015-12-07 VITALS — BP 165/71 | HR 82 | Temp 98.5°F | Wt 283.0 lb

## 2015-12-07 DIAGNOSIS — L97921 Non-pressure chronic ulcer of unspecified part of left lower leg limited to breakdown of skin: Secondary | ICD-10-CM

## 2015-12-07 MED ORDER — TRAMADOL HCL 50 MG PO TABS: 50.0000 mg | ORAL_TABLET | Freq: Four times a day (QID) | ORAL | 0 refills | Status: DC | PRN

## 2015-12-07 NOTE — Progress Notes (Signed)
Outpatient Surgical Follow Up  12/07/2015  [name] is an 55 y.o. male.   Chief Complaint  Patient presents with  . Routine Post Op    Left Leg Hematoma (required I&D 7/23)- Dr. Dahlia [name]    HPI: 55 year old male returns to clinic for follow-up of the left leg hematoma drainage. He reports area continues to improve and decrease in size. He denies any current fevers, chills, nausea, vomiting, chest pain, shortness of breath, diarrhea, cost patient. He does report some discomfort to the area and is asking for pain medications for it night. He has only been taking ibuprofen.  Past Medical History:  Diagnosis Date  . Amnestic MCI (mild cognitive impairment with memory loss)   . Asthma   . Atrial fibrillation ([place])   . Atrial fibrillation ([place]) 2016  . Benign neoplasm of colon   . CAD (coronary artery disease) 2005   had CABG  . CHF (congestive heart failure) ([place])   . Chronic venous stasis dermatitis of both lower extremities   . Depression   . Diabetes mellitus   . Diverticulosis   . Dyspnea   . Erectile dysfunction   . ESRD (end stage renal disease) ([place]) 2005   Dialysis then renal transplant  . GERD (gastroesophageal reflux disease)   . Gout   . Hyperlipidemia    low since renal failure  . Hypertension   . Kidney transplant status, cadaveric 2012  . Obesity   . Pneumonia   . Purpura ([place])   . Sleep apnea    bipap with oxygen 2 l  . Trigger finger   . Wears glasses     Past Surgical History:  Procedure Laterality Date  . BARIATRIC SURGERY    . CARD[facility]AC CATHETERIZATION     [facility]  . COLONOSCOPY WITH PROPOFOL N/A 04/22/2013   Procedure: COLONOSCOPY WITH PROPOFOL;  Surgeon: [name], MD;  [facility];  Service: Endoscopy;  Laterality: N/A;  . CORONARY ARTERY BYPASS GRAFT  2005   X 4  . DG ANGIO AV SHUNT*L*     right and left upper arms  . FASCIOTOMY  03/03/2012   Procedure: FASCIOTOMY;  Surgeon: [name], MD;  Location: [name];  Service: Orthopedics;  Laterality: Right;  FASCIOTOMY RIGHT SMALL FINGER  . FASCIOTOMY Left 08/17/2013   Procedure: FASCIOTOMY LEFT RING;  Surgeon: [name], MD;  Location: [place];  Service: Orthopedics;  Laterality: Left;  . INCISION AND DRAINAGE ABSCESS Left 10/15/2015   Procedure: INCISION AND DRAINAGE ABSCESS;  Surgeon: [name], MD;  Location: [facility];  Service: General;  Laterality: Left;  . KIDNEY TRANSPLANT  09/13/2010   cadaver--at [place]  1/12   left  . VASECTOMY      Family History  Problem Relation Age of Onset  . Heart disease Father   . Kidney failure Father   . Kidney disease Father   . Diabetes Maternal Grandmother   . Breast cancer Maternal Grandmother   . Liver cancer Paternal Uncle   . Liver cancer Paternal Grandmother   . Prostate cancer Neg Hx     Social History:  reports that he quit smoking about 21 years ago. His smoking use included Cigars. He has never used smokeless tobacco. He reports that he drinks alcohol. He reports that he does not use drugs.  Allergies:  Allergies  Allergen Reactions  . Morphine Itching    Medications reviewed.  ROS A multipoint review of systems was completed. All pertinent positives and negatives are documented within the history of present illness and the remainder are negative   BP (!) 165/71   Pulse 82   Temp 98.5 F (36.9 C) (Oral)   Wt 128.4 kg (283 lb)   BMI 41.19 kg/m   Physical Exam Gen.: No acute distress Chest: Clear to auscultation Heart: Regular rhythm Abdomen: Soft and nontender Extremities: Left extremity with a 4 x 2 cm healing ulcer with healthy granulation tissue within it. No evidence of purulence or spreading erythema.    No results found for this or any previous visit (from the past 48 hour(s)). No results found.  Assessment/Plan:  1. Chronic ulcer of left leg, limited to breakdown of skin ([place]) 56 year old male with a  chronic left lower chimney ulcer that currently measures 4 x 2 cm. Reiterated to the patient this would be a long process for it to heal. He is to continue the current outpatient management. Discussed the signs and symptoms of infection and return to clinic immediately should they occur. I will provide him with a small prescription for Ultram today to attempt for better pain control. He'll follow up with Dr. Dahlia [name] in 3 weeks for additional wound check.     Carl Pert, MD FACS General Surgeon  12/07/2015,11:20 AM

## 2015-12-08 DIAGNOSIS — I10 Essential (primary) hypertension: Secondary | ICD-10-CM | POA: Diagnosis not present

## 2015-12-08 DIAGNOSIS — Z7951 Long term (current) use of inhaled steroids: Secondary | ICD-10-CM | POA: Diagnosis not present

## 2015-12-08 DIAGNOSIS — G4733 Obstructive sleep apnea (adult) (pediatric): Secondary | ICD-10-CM | POA: Diagnosis not present

## 2015-12-08 DIAGNOSIS — Z794 Long term (current) use of insulin: Secondary | ICD-10-CM | POA: Diagnosis not present

## 2015-12-08 DIAGNOSIS — I872 Venous insufficiency (chronic) (peripheral): Secondary | ICD-10-CM | POA: Diagnosis not present

## 2015-12-08 DIAGNOSIS — Z48817 Encounter for surgical aftercare following surgery on the skin and subcutaneous tissue: Secondary | ICD-10-CM | POA: Diagnosis not present

## 2015-12-08 DIAGNOSIS — S8012XD Contusion of left lower leg, subsequent encounter: Secondary | ICD-10-CM | POA: Diagnosis not present

## 2015-12-08 DIAGNOSIS — S81802D Unspecified open wound, left lower leg, subsequent encounter: Secondary | ICD-10-CM | POA: Diagnosis not present

## 2015-12-08 DIAGNOSIS — Z940 Kidney transplant status: Secondary | ICD-10-CM | POA: Diagnosis not present

## 2015-12-08 DIAGNOSIS — E119 Type 2 diabetes mellitus without complications: Secondary | ICD-10-CM | POA: Diagnosis not present

## 2015-12-13 ENCOUNTER — Telehealth: Payer: Self-pay | Admitting: Surgery

## 2015-12-13 ENCOUNTER — Telehealth: Payer: Self-pay

## 2015-12-13 ENCOUNTER — Encounter: Payer: Self-pay | Admitting: Internal Medicine

## 2015-12-13 ENCOUNTER — Other Ambulatory Visit: Payer: Self-pay | Admitting: Internal Medicine

## 2015-12-13 DIAGNOSIS — G4733 Obstructive sleep apnea (adult) (pediatric): Secondary | ICD-10-CM | POA: Diagnosis not present

## 2015-12-13 DIAGNOSIS — Z794 Long term (current) use of insulin: Secondary | ICD-10-CM | POA: Diagnosis not present

## 2015-12-13 DIAGNOSIS — S81802D Unspecified open wound, left lower leg, subsequent encounter: Secondary | ICD-10-CM | POA: Diagnosis not present

## 2015-12-13 DIAGNOSIS — I872 Venous insufficiency (chronic) (peripheral): Secondary | ICD-10-CM | POA: Diagnosis not present

## 2015-12-13 DIAGNOSIS — S8012XD Contusion of left lower leg, subsequent encounter: Secondary | ICD-10-CM | POA: Diagnosis not present

## 2015-12-13 DIAGNOSIS — Z940 Kidney transplant status: Secondary | ICD-10-CM | POA: Diagnosis not present

## 2015-12-13 DIAGNOSIS — Z7951 Long term (current) use of inhaled steroids: Secondary | ICD-10-CM | POA: Diagnosis not present

## 2015-12-13 DIAGNOSIS — E119 Type 2 diabetes mellitus without complications: Secondary | ICD-10-CM | POA: Diagnosis not present

## 2015-12-13 DIAGNOSIS — Z48817 Encounter for surgical aftercare following surgery on the skin and subcutaneous tissue: Secondary | ICD-10-CM | POA: Diagnosis not present

## 2015-12-13 DIAGNOSIS — I10 Essential (primary) hypertension: Secondary | ICD-10-CM | POA: Diagnosis not present

## 2015-12-13 MED ORDER — GABAPENTIN 300 MG PO CAPS
300.0000 mg | ORAL_CAPSULE | Freq: Three times a day (TID) | ORAL | 0 refills | Status: DC
Start: 2015-12-13 — End: 2016-01-22

## 2015-12-13 NOTE — Telephone Encounter (Signed)
Patient of Dr Dahlia Byes. 55 year old male with a chronic left lower chimney ulcer that currently measures 4 x 2 cm. Saw Dr Adonis Huguenin in the office on 9/14 and was given a small prescription of Tramadol to attempt for better pain control. Patient is calling today because the Tramadol is not working. Would like a prescription for something else. Please call and advise.

## 2015-12-13 NOTE — Telephone Encounter (Signed)
Patient does have an Left Lower Extremity Ulcer currently.  Returned phone call to patient at this time. Patient is still having pain. Has tried 156m Tylenol at a time and Ultram per prescription instructions but neither has helped with pain.  He describes this as a burning pain and states that this feels like sharp "lightning bolts" in his leg.  I explained to patient that this sounds like nerve pain and I would speak with surgeon in the office today about prescribing Neurontin.

## 2015-12-13 NOTE — Telephone Encounter (Signed)
Mendel Ryder nurse with [facility] left v/m; pt was to be discharged today for wound care but wound has not completely healed and pt has started with pain at wound.Mendel Ryder wants to know if she should discharge pt or does Dr Silvio Pate want pt to be recertified. Need order for recertification for 2 more weeks if that is what Dr Silvio Pate request. Please advise.

## 2015-12-13 NOTE — Telephone Encounter (Signed)
I am not surprised it is not healed yet. I agree that recertifying for another 2 weeks is probably a good idea--okay to proceed

## 2015-12-13 NOTE — Telephone Encounter (Signed)
Left Detailed message on Lindsey's VM with verbal authorization

## 2015-12-13 NOTE — Telephone Encounter (Signed)
Medication sent to pharmacy.

## 2015-12-14 DIAGNOSIS — R609 Edema, unspecified: Secondary | ICD-10-CM | POA: Diagnosis not present

## 2015-12-14 DIAGNOSIS — Z79899 Other long term (current) drug therapy: Secondary | ICD-10-CM | POA: Diagnosis not present

## 2015-12-14 DIAGNOSIS — Z7901 Long term (current) use of anticoagulants: Secondary | ICD-10-CM | POA: Diagnosis not present

## 2015-12-14 DIAGNOSIS — I4891 Unspecified atrial fibrillation: Secondary | ICD-10-CM | POA: Diagnosis not present

## 2015-12-14 DIAGNOSIS — G4733 Obstructive sleep apnea (adult) (pediatric): Secondary | ICD-10-CM | POA: Diagnosis not present

## 2015-12-14 DIAGNOSIS — E1122 Type 2 diabetes mellitus with diabetic chronic kidney disease: Secondary | ICD-10-CM | POA: Diagnosis not present

## 2015-12-14 DIAGNOSIS — E876 Hypokalemia: Secondary | ICD-10-CM | POA: Diagnosis not present

## 2015-12-14 DIAGNOSIS — E874 Mixed disorder of acid-base balance: Secondary | ICD-10-CM | POA: Diagnosis not present

## 2015-12-14 DIAGNOSIS — Z4822 Encounter for aftercare following kidney transplant: Secondary | ICD-10-CM | POA: Diagnosis not present

## 2015-12-14 DIAGNOSIS — I120 Hypertensive chronic kidney disease with stage 5 chronic kidney disease or end stage renal disease: Secondary | ICD-10-CM | POA: Diagnosis not present

## 2015-12-14 DIAGNOSIS — N186 End stage renal disease: Secondary | ICD-10-CM | POA: Diagnosis not present

## 2015-12-14 DIAGNOSIS — Z9884 Bariatric surgery status: Secondary | ICD-10-CM | POA: Diagnosis not present

## 2015-12-14 DIAGNOSIS — B259 Cytomegaloviral disease, unspecified: Secondary | ICD-10-CM | POA: Diagnosis not present

## 2015-12-14 DIAGNOSIS — R809 Proteinuria, unspecified: Secondary | ICD-10-CM | POA: Diagnosis not present

## 2015-12-14 DIAGNOSIS — N059 Unspecified nephritic syndrome with unspecified morphologic changes: Secondary | ICD-10-CM | POA: Diagnosis not present

## 2015-12-15 DIAGNOSIS — G4733 Obstructive sleep apnea (adult) (pediatric): Secondary | ICD-10-CM | POA: Diagnosis not present

## 2015-12-15 DIAGNOSIS — I482 Chronic atrial fibrillation: Secondary | ICD-10-CM | POA: Diagnosis not present

## 2015-12-15 DIAGNOSIS — L0390 Cellulitis, unspecified: Secondary | ICD-10-CM | POA: Diagnosis not present

## 2015-12-15 DIAGNOSIS — E662 Morbid (severe) obesity with alveolar hypoventilation: Secondary | ICD-10-CM | POA: Diagnosis not present

## 2015-12-18 DIAGNOSIS — Z48817 Encounter for surgical aftercare following surgery on the skin and subcutaneous tissue: Secondary | ICD-10-CM | POA: Diagnosis not present

## 2015-12-18 DIAGNOSIS — S81802D Unspecified open wound, left lower leg, subsequent encounter: Secondary | ICD-10-CM | POA: Diagnosis not present

## 2015-12-18 DIAGNOSIS — S8012XD Contusion of left lower leg, subsequent encounter: Secondary | ICD-10-CM | POA: Diagnosis not present

## 2015-12-18 DIAGNOSIS — E119 Type 2 diabetes mellitus without complications: Secondary | ICD-10-CM | POA: Diagnosis not present

## 2015-12-19 ENCOUNTER — Other Ambulatory Visit: Payer: Self-pay | Admitting: Cardiovascular Disease

## 2015-12-26 DIAGNOSIS — E662 Morbid (severe) obesity with alveolar hypoventilation: Secondary | ICD-10-CM | POA: Diagnosis not present

## 2015-12-26 DIAGNOSIS — G4733 Obstructive sleep apnea (adult) (pediatric): Secondary | ICD-10-CM | POA: Diagnosis not present

## 2015-12-27 ENCOUNTER — Ambulatory Visit (INDEPENDENT_AMBULATORY_CARE_PROVIDER_SITE_OTHER): Payer: 59 | Admitting: Surgery

## 2015-12-27 ENCOUNTER — Encounter: Payer: Self-pay | Admitting: Surgery

## 2015-12-27 VITALS — BP 151/76 | HR 76 | Temp 98.1°F | Ht 71.0 in | Wt 287.4 lb

## 2015-12-27 DIAGNOSIS — Z09 Encounter for follow-up examination after completed treatment for conditions other than malignant neoplasm: Secondary | ICD-10-CM

## 2015-12-27 NOTE — Patient Instructions (Signed)
Please follow-up as scheduled below.

## 2015-12-27 NOTE — Progress Notes (Signed)
S/p left leg hematoma evacuation, now w chronic wound leg Doing well Some edema He is s/p kidney transplant   PE NAD Some leg edema Wound good granulation tissue , 3.5 x 2 cm, very superficial.  A/P continue BID dressing changes, Elevated LE to improve edema Offered him to be seen at the wound clinic but he wishes to stay with [name] F/U 4-6 weeks, no need for surgical intervention. Graft wound be too much in this situation and expose him at increase risk given his immunosupression

## 2015-12-28 DIAGNOSIS — T8189XA Other complications of procedures, not elsewhere classified, initial encounter: Secondary | ICD-10-CM | POA: Diagnosis not present

## 2015-12-28 DIAGNOSIS — Z940 Kidney transplant status: Secondary | ICD-10-CM | POA: Diagnosis not present

## 2015-12-29 DIAGNOSIS — S81802D Unspecified open wound, left lower leg, subsequent encounter: Secondary | ICD-10-CM | POA: Diagnosis not present

## 2015-12-29 DIAGNOSIS — S8012XD Contusion of left lower leg, subsequent encounter: Secondary | ICD-10-CM | POA: Diagnosis not present

## 2015-12-29 DIAGNOSIS — Z794 Long term (current) use of insulin: Secondary | ICD-10-CM | POA: Diagnosis not present

## 2015-12-29 DIAGNOSIS — Z7951 Long term (current) use of inhaled steroids: Secondary | ICD-10-CM | POA: Diagnosis not present

## 2015-12-29 DIAGNOSIS — G4733 Obstructive sleep apnea (adult) (pediatric): Secondary | ICD-10-CM | POA: Diagnosis not present

## 2015-12-29 DIAGNOSIS — Z940 Kidney transplant status: Secondary | ICD-10-CM | POA: Diagnosis not present

## 2015-12-29 DIAGNOSIS — Z48817 Encounter for surgical aftercare following surgery on the skin and subcutaneous tissue: Secondary | ICD-10-CM | POA: Diagnosis not present

## 2016-01-10 DIAGNOSIS — Z9989 Dependence on other enabling machines and devices: Secondary | ICD-10-CM | POA: Diagnosis not present

## 2016-01-10 DIAGNOSIS — G4733 Obstructive sleep apnea (adult) (pediatric): Secondary | ICD-10-CM | POA: Diagnosis not present

## 2016-01-10 DIAGNOSIS — E1122 Type 2 diabetes mellitus with diabetic chronic kidney disease: Secondary | ICD-10-CM | POA: Diagnosis not present

## 2016-01-10 DIAGNOSIS — L03116 Cellulitis of left lower limb: Secondary | ICD-10-CM | POA: Diagnosis not present

## 2016-01-10 DIAGNOSIS — N186 End stage renal disease: Secondary | ICD-10-CM | POA: Diagnosis not present

## 2016-01-10 DIAGNOSIS — Z7901 Long term (current) use of anticoagulants: Secondary | ICD-10-CM | POA: Diagnosis not present

## 2016-01-10 DIAGNOSIS — R601 Generalized edema: Secondary | ICD-10-CM | POA: Diagnosis not present

## 2016-01-10 DIAGNOSIS — R809 Proteinuria, unspecified: Secondary | ICD-10-CM | POA: Diagnosis not present

## 2016-01-10 DIAGNOSIS — I4891 Unspecified atrial fibrillation: Secondary | ICD-10-CM | POA: Diagnosis not present

## 2016-01-10 DIAGNOSIS — Z79899 Other long term (current) drug therapy: Secondary | ICD-10-CM | POA: Diagnosis not present

## 2016-01-10 DIAGNOSIS — Z4822 Encounter for aftercare following kidney transplant: Secondary | ICD-10-CM | POA: Diagnosis not present

## 2016-01-10 DIAGNOSIS — D8989 Other specified disorders involving the immune mechanism, not elsewhere classified: Secondary | ICD-10-CM | POA: Diagnosis not present

## 2016-01-10 DIAGNOSIS — E876 Hypokalemia: Secondary | ICD-10-CM | POA: Diagnosis not present

## 2016-01-10 DIAGNOSIS — Z6839 Body mass index (BMI) 39.0-39.9, adult: Secondary | ICD-10-CM | POA: Diagnosis not present

## 2016-01-10 DIAGNOSIS — Z794 Long term (current) use of insulin: Secondary | ICD-10-CM | POA: Diagnosis not present

## 2016-01-10 DIAGNOSIS — I10 Essential (primary) hypertension: Secondary | ICD-10-CM | POA: Diagnosis not present

## 2016-01-10 DIAGNOSIS — E119 Type 2 diabetes mellitus without complications: Secondary | ICD-10-CM | POA: Diagnosis not present

## 2016-01-10 DIAGNOSIS — I120 Hypertensive chronic kidney disease with stage 5 chronic kidney disease or end stage renal disease: Secondary | ICD-10-CM | POA: Diagnosis not present

## 2016-01-10 DIAGNOSIS — B259 Cytomegaloviral disease, unspecified: Secondary | ICD-10-CM | POA: Diagnosis not present

## 2016-01-10 DIAGNOSIS — I2510 Atherosclerotic heart disease of native coronary artery without angina pectoris: Secondary | ICD-10-CM | POA: Diagnosis not present

## 2016-01-10 DIAGNOSIS — N269 Renal sclerosis, unspecified: Secondary | ICD-10-CM | POA: Diagnosis not present

## 2016-01-10 DIAGNOSIS — E872 Acidosis: Secondary | ICD-10-CM | POA: Diagnosis not present

## 2016-01-10 DIAGNOSIS — Z940 Kidney transplant status: Secondary | ICD-10-CM | POA: Diagnosis not present

## 2016-01-17 ENCOUNTER — Telehealth: Payer: Self-pay

## 2016-01-17 ENCOUNTER — Ambulatory Visit: Payer: 59

## 2016-01-17 ENCOUNTER — Other Ambulatory Visit: Payer: Self-pay

## 2016-01-17 VITALS — BP 150/68 | Ht 71.0 in

## 2016-01-17 DIAGNOSIS — Z794 Long term (current) use of insulin: Secondary | ICD-10-CM

## 2016-01-17 DIAGNOSIS — E118 Type 2 diabetes mellitus with unspecified complications: Secondary | ICD-10-CM

## 2016-01-17 LAB — POCT GLYCOSYLATED HEMOGLOBIN (HGB A1C): Hemoglobin A1C: 7.2

## 2016-01-17 NOTE — Patient Outreach (Signed)
[place] [facility]) Care Management  [place]  01/17/2016   JAIMIN KRUPKA [birth year] [id number]  Subjective: Met with patient for his Link to Wellness visit.  His left lower front of the  leg has a 4"2" dressing on it and he tells me there is an open wound under the bandage but there is also a wet spot on the dressing which Saksham identifies as "new" spots that are popping up that are elevated and contain fluid.  One is visible on the front of his shin and he has numerous smaller ones on the back of his shin- none are open. They are not painful.  The skin on his lower leg is dark in color all the way down to his ankle bone.  His foot is not discoloured and he has no open areas or water blisters on his foot.  The skin is not hot but the skin on his lower leg is taut.   Terance reports that he has started back at the gym- spends about 2 hours there each morning and is doing cardio and weight lifting.  He reports fasting blood sugars are 90-112m/dl, before lunch are 120-1360mdl and 2 hour post meal are 160-20014ml.  He denies hypoglycemia- he does eat before he goes to the gym and carries glucose tablets with him at all times.   Objective: POCT A1C 7.2% Vitals:   01/17/16 1124  BP: (!) 150/68  Height: 1.803 m (5' 11")      Encounter Medications:  Outpatient Encounter Prescriptions as of 01/17/2016  Medication Sig Note  . amLODipine (NORVASC) 5 MG tablet Take 1 tablet (5 mg total) by mouth daily.   . bMarland KitchenPROPion (WELLBUTRIN) 75 MG tablet TAKE 1 TABLET BY MOUTH 2 TIMES DAILY.   . CMarland KitchenALIS 20 MG tablet TAKE 1/2 TO 1 TABLET BY MOUTH EVERY OTHER DAY AS NEEDED   . ELIQUIS 5 MG TABS tablet TAKE 1 TABLET BY MOUTH 2 TIMES DAILY.   . furosemide (LASIX) 40 MG tablet Take 40 mg by mouth daily. Every other day- 01/17/16- has been taking it every day   . insulin aspart (NOVOLOG FLEXPEN) 100 UNIT/ML FlexPen Inject 5 Units into the skin 3 (three) times daily with meals as needed for high  blood sugar. Pt uses as needed per sliding scale. Patient is taking 2-8 units tid   . insulin glargine (LANTUS) 100 unit/mL SOPN Inject 15 Units into the skin at bedtime.    . lMarland Kitchensinopril (PRINIVIL,ZESTRIL) 40 MG tablet TAKE 1 TABLET BY MOUTH DAILY.   . metolazone (ZAROXOLYN) 2.5 MG tablet Take 2.5 mg by mouth daily as needed (for edema).  10/12/2015: Takes as needed. Last dose last Thursday or Friday.  . mometasone (NASONEX) 50 MCG/ACT nasal spray Place 2 sprays into both nostrils at bedtime.   . Multiple Vitamin (MULTIVITAMIN WITH MINERALS) TABS tablet Take 1 tablet by mouth daily.   . oMarland Kitcheneprazole (PRILOSEC) 20 MG capsule Take 20 mg by mouth daily.   . potassium chloride (K-DUR,KLOR-CON) 10 MEQ tablet Take 30 mEq by mouth daily.   . tacrolimus (PROGRAF) 0.5 MG capsule Take 0.5 mg by mouth 2 (two) times daily.    . aMarland KitchenLODipine (NORVASC) 5 MG tablet TAKE 1 TABLET BY MOUTH DAILY (Patient not taking: Reported on 01/17/2016)   . gabapentin (NEURONTIN) 300 MG capsule Take 1 capsule (300 mg total) by mouth 3 (three) times daily. (Patient not taking: Reported on 01/17/2016)   . traMADol (ULTRAM) 50 MG tablet Take 1 tablet (  50 mg total) by mouth every 6 (six) hours as needed. (Patient not taking: Reported on 01/17/2016)   . valGANciclovir (VALCYTE) 450 MG tablet Take 1 tablet by mouth 2 (two) times daily.  11/21/2015: Received from: External Pharmacy   No facility-administered encounter medications on file as of 01/17/2016.     Functional Status:  In your present state of health, do you have any difficulty performing the following activities: 10/12/2015 08/30/2015  Hearing? N N  Vision? N N  Difficulty concentrating or making decisions? N N  Walking or climbing stairs? N N  Dressing or bathing? N N  Doing errands, shopping? N N  Some recent data might be hidden    Fall/Depression Screening: PHQ 2/9 Scores 01/17/2016 01/17/2016 11/29/2015 10/10/2015 07/18/2015 12/06/2014 10/12/2014  PHQ - 2 Score 0 0 0 2 0 0  0    Assessment: Now that the lower leg has almost healed, he is back at the gym and has begun to work. I have encouraged him to continue checking blood sugars and exercising. We reviewed sick day guidelines.    Plan:  [facility] CM Care Plan Problem Two   Flowsheet Row Most Recent Value  Care Plan Problem Two  Potential for elevated A1C > 7%  Role Documenting the Problem Two  Care Management Coordinator  Care Plan for Problem Two  Active  Interventions for Problem Two Long Term Goal   reviewed the importance of ideal blood sugar control for wound healing 2. reviewed the importance of daily foot exams 3. encouraged patient to follow up with MD re,  guidelines for exercise since patient has fluid blisters appearing on the lower left leg   THN Long Term Goal (31-90) days  Patient will maintain A1C less than 7%  THN Long Term Goal Start Date  01/17/16     Encouraged to check blood sugars at least 2 x/ day.  I have asked him to call the MD to discuss new blisters on the lower left leg.  Continue to do a foot exam daily.  Follow up in approximately 5 weeks.   [name], RN, BA, [place], [place] Direct Dial:  [phone]  Fax:  [phone] E-mail: [name] [address], [place], [place]  [address]

## 2016-01-17 NOTE — Telephone Encounter (Signed)
Received a note from [facility] on the pt. Dr Silvio Pate wanted to follow-up with the pt and see if he wanted an OV for his leg wound. I left a message for the pt to call the office to schedule an appt if he wanted.

## 2016-01-22 ENCOUNTER — Other Ambulatory Visit: Payer: Self-pay | Admitting: Surgery

## 2016-01-22 MED ORDER — GABAPENTIN 300 MG PO CAPS: 300.0000 mg | ORAL_CAPSULE | Freq: Three times a day (TID) | ORAL | 2 refills | Status: DC

## 2016-01-22 NOTE — Telephone Encounter (Signed)
Pt needs a refill on the Gabapentin. Pt uses the [facility] outpatient pharmacy. Please advise.

## 2016-01-22 NOTE — Telephone Encounter (Signed)
Medication sent to pharmacy at this time.

## 2016-01-26 DIAGNOSIS — G4733 Obstructive sleep apnea (adult) (pediatric): Secondary | ICD-10-CM | POA: Diagnosis not present

## 2016-01-26 DIAGNOSIS — E662 Morbid (severe) obesity with alveolar hypoventilation: Secondary | ICD-10-CM | POA: Diagnosis not present

## 2016-01-26 DIAGNOSIS — Z4822 Encounter for aftercare following kidney transplant: Secondary | ICD-10-CM | POA: Diagnosis not present

## 2016-01-26 DIAGNOSIS — Z940 Kidney transplant status: Secondary | ICD-10-CM | POA: Diagnosis not present

## 2016-02-02 ENCOUNTER — Ambulatory Visit (INDEPENDENT_AMBULATORY_CARE_PROVIDER_SITE_OTHER): Payer: 59 | Admitting: Surgery

## 2016-02-02 ENCOUNTER — Encounter: Payer: Self-pay | Admitting: Cardiovascular Disease

## 2016-02-02 ENCOUNTER — Ambulatory Visit (INDEPENDENT_AMBULATORY_CARE_PROVIDER_SITE_OTHER): Payer: 59 | Admitting: Cardiovascular Disease

## 2016-02-02 ENCOUNTER — Encounter: Payer: Self-pay | Admitting: Surgery

## 2016-02-02 VITALS — BP 154/88 | HR 62 | Temp 98.4°F | Wt 274.0 lb

## 2016-02-02 VITALS — BP 142/82 | HR 67 | Ht 70.0 in | Wt 275.0 lb

## 2016-02-02 DIAGNOSIS — I482 Chronic atrial fibrillation, unspecified: Secondary | ICD-10-CM

## 2016-02-02 DIAGNOSIS — I10 Essential (primary) hypertension: Secondary | ICD-10-CM

## 2016-02-02 DIAGNOSIS — I5022 Chronic systolic (congestive) heart failure: Secondary | ICD-10-CM | POA: Diagnosis not present

## 2016-02-02 DIAGNOSIS — Z09 Encounter for follow-up examination after completed treatment for conditions other than malignant neoplasm: Secondary | ICD-10-CM

## 2016-02-02 DIAGNOSIS — I2510 Atherosclerotic heart disease of native coronary artery without angina pectoris: Secondary | ICD-10-CM

## 2016-02-02 MED ORDER — ROSUVASTATIN CALCIUM 10 MG PO TABS: 10.0000 mg | ORAL_TABLET | Freq: Every day | ORAL | 3 refills | Status: DC

## 2016-02-02 MED ORDER — LISINOPRIL 40 MG PO TABS: 40.0000 mg | ORAL_TABLET | Freq: Every day | ORAL | 1 refills | Status: DC

## 2016-02-02 NOTE — Progress Notes (Signed)
Cardiology Office Note   Date:  02/02/2016   ID:  [name], [name] 1960-08-07, MRN [id number]  PCP:  [name], MD  Cardiologist:   [name], MD   No chief complaint on file.     History of Present Illness: [name] is a 55 y.o. male who presents for  a follow up visit. He has known history of coronary artery disease status post CABG in 2004 with no recurrent ischemic events since then, chronic systolic heart failure with ejection fraction of 35-40 %, type 2 diabetes, end-stage renal disease previously on hemodialysis from 2004 - 2012 when he got a kidney transplant, hypertension, hyperlipidemia, chronic atrial fibrillation and sleep apnea.  Echocardiogram in February of 2015 showed an ejection fraction of 35-40% with grade 2 diastolic dysfunction and significantly dilated left atrium.  She underwent gastric bypass in September 9622 without complications.  He did have significant bradycardia while he was on small dose carvedilol which was discontinued. He has been doing well overall with no chest pain or shortness of breath. He has been hospitalized twice since his last visit. The first time was for CMV infection. The second was for left leg hematoma after a puncture wound.   Past Medical History:  Diagnosis Date  . Amnestic MCI (mild cognitive impairment with memory loss)   . Asthma   . Atrial fibrillation ([place])   . Atrial fibrillation ([place]) 2016  . Benign neoplasm of colon   . CAD (coronary artery disease) 2005   had CABG  . CHF (congestive heart failure) ([place])   . Chronic venous stasis dermatitis of both lower extremities   . Cytomegaloviral disease ([place]) 2017  . Depression   . Diabetes mellitus   . Diverticulosis   . Dyspnea   . Erectile dysfunction   . ESRD (end stage renal disease) ([place]) 2005   Dialysis then renal transplant  . GERD (gastroesophageal reflux disease)   . Gout   . Hyperlipidemia    low since renal failure  . Hypertension   .  Kidney transplant status, cadaveric 2012  . Obesity   . Pneumonia   . Purpura ([place])   . Sleep apnea    bipap with oxygen 2 l  . Trigger finger   . Wears glasses     Past Surgical History:  Procedure Laterality Date  . BARIATRIC SURGERY    . CARDIAC CATHETERIZATION     [facility]  . COLONOSCOPY WITH PROPOFOL N/A 04/22/2013   Procedure: COLONOSCOPY WITH PROPOFOL;  Surgeon: [name], MD;  Location: [facility];  Service: Endoscopy;  Laterality: N/A;  . CORONARY ARTERY BYPASS GRAFT  2005   X 4  . DG ANGIO AV SHUNT*L*     right and left upper arms  . FASCIOTOMY  03/03/2012   Procedure: FASCIOTOMY;  Surgeon: [name], MD;  Location: [place];  Service: Orthopedics;  Laterality: Right;  FASCIOTOMY RIGHT SMALL FINGER  . FASCIOTOMY Left 08/17/2013   Procedure: FASCIOTOMY LEFT RING;  Surgeon: [name], MD;  Location: [place];  Service: Orthopedics;  Laterality: Left;  . INCISION AND DRAINAGE ABSCESS Left 10/15/2015   Procedure: INCISION AND DRAINAGE ABSCESS;  Surgeon: [name], MD;  Location: [facility];  Service: General;  Laterality: Left;  . KIDNEY TRANSPLANT  09/13/2010   cadaver--at [facility]  1/12   left  . VASECTOMY       Current Outpatient Prescriptions  Medication Sig Dispense Refill  . amLODipine (NORVASC) 5 MG tablet TAKE 1 TABLET BY MOUTH DAILY 90 tablet 1  . buPROPion (WELLBUTRIN) 75 MG tablet TAKE 1 TABLET BY MOUTH 2 TIMES DAILY. 180 tablet 0  . CIALIS 20 MG tablet TAKE 1/2 TO 1 TABLET BY MOUTH EVERY OTHER DAY AS NEEDED 5 tablet 11  . ELIQUIS 5 MG TABS tablet TAKE 1 TABLET BY MOUTH 2 TIMES DAILY. 60 tablet 3  . furosemide (LASIX) 40 MG tablet Take 40 mg by mouth daily. Every other day- 01/17/16- has been taking it every day    . insulin aspart (NOVOLOG FLEXPEN) 100 UNIT/ML FlexPen Inject 5 Units into the skin 3 (three) times daily with meals as needed for high blood sugar. Pt uses as needed per sliding  scale. Patient is taking 2-8 units tid    . insulin glargine (LANTUS) 100 unit/mL SOPN Inject 15 Units into the skin at bedtime.     Marland Kitchen lisinopril (PRINIVIL,ZESTRIL) 40 MG tablet Take 1 tablet (40 mg total) by mouth daily. 30 tablet 1  . metolazone (ZAROXOLYN) 2.5 MG tablet Take 2.5 mg by mouth daily as needed (for edema).     . mometasone (NASONEX) 50 MCG/ACT nasal spray Place 2 sprays into both nostrils at bedtime.    . Multiple Vitamin (MULTIVITAMIN WITH MINERALS) TABS tablet Take 1 tablet by mouth daily.    Marland Kitchen omeprazole (PRILOSEC) 20 MG capsule Take 20 mg by mouth daily.    . potassium chloride (K-DUR,KLOR-CON) 10 MEQ tablet Take 30 mEq by mouth daily.    . tacrolimus (PROGRAF) 0.5 MG capsule Take 0.5 mg by mouth 2 (two) times daily.     Marland Kitchen gabapentin (NEURONTIN) 300 MG capsule Take 300 mg by mouth 3 (three) times daily.    . mycophenolate (MYFORTIC) 180 MG EC tablet Take 180 mg by mouth 2 (two) times daily.    . rosuvastatin (CRESTOR) 10 MG tablet Take 1 tablet (10 mg total) by mouth daily. 90 tablet 3   No current facility-administered medications for this visit.     Allergies:   Morphine    Social History:  The patient  reports that he quit smoking about 21 years ago. His smoking use included Cigars. He has never used smokeless tobacco. He reports that he drinks alcohol. He reports that he does not use drugs.   Family History:  The patient's family history includes Breast cancer in his maternal grandmother; Diabetes in his maternal grandmother; Heart disease in his father; Kidney disease in his father; Kidney failure in his father; Liver cancer in his paternal grandmother and paternal uncle.    ROS:  Please see the history of present illness.   Otherwise, review of systems are positive for none.   All other systems are reviewed and negative.    PHYSICAL EXAM: VS:  BP (!) 142/82 (BP Location: Left Arm, Patient Position: Sitting, Cuff Size: Large)   Pulse 67   Ht _0  (1.778 m)    Wt 275 lb (124.7 kg)   BMI 39.46 kg/m  , BMI Body mass index is 39.46 kg/m. GEN: Well nourished, well developed, in no acute distress  HEENT: normal  Neck: no JVD, carotid bruits, or masses Cardiac: Irregularly irregular; no murmurs, rubs, or gallops,no edema  Respiratory:  clear to auscultation bilaterally, normal work of breathing GI: soft, nontender, nondistended, + BS MS: no deformity or atrophy  Skin: warm and dry, no rash Neuro:  Strength and sensation are intact Psych: euthymic  mood, full affect   EKG:  EKG is ordered today. The ekg ordered today demonstrates atrial fibrillation with nonspecific IVCD. PVC.   Recent Labs: 03/07/2015: TSH 1.700 08/31/2015: ALT 27 10/18/2015: BUN 16; Creatinine, Ser 0.74; Hemoglobin 11.7; Platelets 165; Potassium 4.2; Sodium 140    Lipid Panel    Component Value Date/Time   CHOL 161 03/07/2015 0823   TRIG 88 03/07/2015 0823   HDL 38 (L) 03/07/2015 0823   CHOLHDL 4.2 03/07/2015 0823   CHOLHDL 7 04/20/2012 1618   VLDL 131.0 (H) 04/20/2012 1618   LDLCALC 105 (H) 03/07/2015 0823   LDLDIRECT 41.9 04/20/2012 1618      Wt Readings from Last 3 Encounters:  02/02/16 274 lb (124.3 kg)  02/02/16 275 lb (124.7 kg)  12/27/15 287 lb 6.4 oz (130.4 kg)       No flowsheet data found.    ASSESSMENT AND PLAN:  1.  Chronic systolic heart failure: He appears to be euvolemic. He is not on carvedilol due to bradycardia while he was on small dose. I discussed with him the option of switching lisinopril to [facility]. However, he is concerned about cost as he is on multiple branded medications. Most recent ejection fraction was 35-40%.  2. Chronic atrial fibrillation: Rate is controlled without medications. He is tolerating anticoagulation with Eliquis.  3. Coronary artery disease involving native coronary arteries without angina: Continue medical therapy.  4. Hyperlipidemia: Most recent LDL was 105. Given coronary artery disease and diabetes, there  is a strong indication for treatment with a statin. I started him on small dose rosuvastatin. Check fasting lipid and liver profile in 6 weeks.   Disposition:   FU with me in 3 months  Signed,  [name], MD  02/02/2016 6:33 PM    [place]

## 2016-02-02 NOTE — Progress Notes (Signed)
F/u LEft leg sup wound, no pain, wound scbing over, no fevers, chills. On antirejection meds for KT  PE NAD Wound w scab in place, pinpoint opening, no infection. Covered w neosporin and wrap  A/P doing well almost completely healed wound RTC prn Continue dressing changes once small pinpoint opening closes may DC dressing changes

## 2016-02-02 NOTE — Patient Instructions (Signed)
Medication Instructions:  Your physician has recommended you make the following change in your medication:  START taking rosuvastatin 23m once daily   Labwork: Fasting lipid and liver profile in 6 weeks  Testing/Procedures: None  Follow-Up: .Your physician recommends that you schedule a follow-up appointment in: 3 months with Dr. AFletcher Anon    Any Other Special Instructions Will Be Listed Below (If Applicable).     If you need a refill on your cardiac medications before your next appointment, please call your pharmacy.

## 2016-02-08 DIAGNOSIS — G4733 Obstructive sleep apnea (adult) (pediatric): Secondary | ICD-10-CM | POA: Diagnosis not present

## 2016-02-08 DIAGNOSIS — N186 End stage renal disease: Secondary | ICD-10-CM | POA: Diagnosis not present

## 2016-02-08 DIAGNOSIS — I4891 Unspecified atrial fibrillation: Secondary | ICD-10-CM | POA: Diagnosis not present

## 2016-02-08 DIAGNOSIS — Z885 Allergy status to narcotic agent status: Secondary | ICD-10-CM | POA: Diagnosis not present

## 2016-02-08 DIAGNOSIS — E119 Type 2 diabetes mellitus without complications: Secondary | ICD-10-CM | POA: Diagnosis not present

## 2016-02-08 DIAGNOSIS — Z79899 Other long term (current) drug therapy: Secondary | ICD-10-CM | POA: Diagnosis not present

## 2016-02-08 DIAGNOSIS — R809 Proteinuria, unspecified: Secondary | ICD-10-CM | POA: Diagnosis not present

## 2016-02-08 DIAGNOSIS — Z7901 Long term (current) use of anticoagulants: Secondary | ICD-10-CM | POA: Diagnosis not present

## 2016-02-08 DIAGNOSIS — Z794 Long term (current) use of insulin: Secondary | ICD-10-CM | POA: Diagnosis not present

## 2016-02-08 DIAGNOSIS — I120 Hypertensive chronic kidney disease with stage 5 chronic kidney disease or end stage renal disease: Secondary | ICD-10-CM | POA: Diagnosis not present

## 2016-02-08 DIAGNOSIS — B259 Cytomegaloviral disease, unspecified: Secondary | ICD-10-CM | POA: Diagnosis not present

## 2016-02-08 DIAGNOSIS — Z4822 Encounter for aftercare following kidney transplant: Secondary | ICD-10-CM | POA: Diagnosis not present

## 2016-02-08 DIAGNOSIS — E1122 Type 2 diabetes mellitus with diabetic chronic kidney disease: Secondary | ICD-10-CM | POA: Diagnosis not present

## 2016-02-08 DIAGNOSIS — Z940 Kidney transplant status: Secondary | ICD-10-CM | POA: Diagnosis not present

## 2016-02-08 DIAGNOSIS — I10 Essential (primary) hypertension: Secondary | ICD-10-CM | POA: Diagnosis not present

## 2016-02-08 DIAGNOSIS — E872 Acidosis: Secondary | ICD-10-CM | POA: Diagnosis not present

## 2016-02-08 DIAGNOSIS — D8989 Other specified disorders involving the immune mechanism, not elsewhere classified: Secondary | ICD-10-CM | POA: Diagnosis not present

## 2016-02-13 DIAGNOSIS — E119 Type 2 diabetes mellitus without complications: Secondary | ICD-10-CM | POA: Diagnosis not present

## 2016-02-13 DIAGNOSIS — Z794 Long term (current) use of insulin: Secondary | ICD-10-CM | POA: Diagnosis not present

## 2016-02-21 ENCOUNTER — Other Ambulatory Visit: Payer: Self-pay | Admitting: Internal Medicine

## 2016-02-21 ENCOUNTER — Other Ambulatory Visit: Payer: Self-pay | Admitting: Cardiovascular Disease

## 2016-02-21 MED ORDER — BUPROPION HCL 75 MG PO TABS: 75.0000 mg | ORAL_TABLET | Freq: Two times a day (BID) | ORAL | 3 refills | Status: DC

## 2016-02-21 NOTE — Telephone Encounter (Signed)
Rx sent electronically 

## 2016-02-25 DIAGNOSIS — G4733 Obstructive sleep apnea (adult) (pediatric): Secondary | ICD-10-CM | POA: Diagnosis not present

## 2016-02-25 DIAGNOSIS — E662 Morbid (severe) obesity with alveolar hypoventilation: Secondary | ICD-10-CM | POA: Diagnosis not present

## 2016-02-26 ENCOUNTER — Other Ambulatory Visit: Payer: Self-pay

## 2016-02-26 VITALS — BP 170/92 | HR 63 | Ht 71.0 in | Wt 286.2 lb

## 2016-02-26 DIAGNOSIS — E118 Type 2 diabetes mellitus with unspecified complications: Secondary | ICD-10-CM

## 2016-02-26 DIAGNOSIS — Z794 Long term (current) use of insulin: Secondary | ICD-10-CM

## 2016-02-26 NOTE — Patient Outreach (Signed)
[name]) Care Management  [place]  02/26/2016   [name] [birth year] [id number]  Subjective: Met with [name] in the Link to Wellness diabetes program.  He did not bring his meter with him but reports that he checks sugars 2-3 times a day; he has had 2 eisodes of hypoglycemia, 1 while working and 1 at the gym.  Both times he had not eaten prior to the event. Reports fasting blood sugars 89-146m/dl, before meals 100-140 and after meals 140-1865mdl. He reports exercising 7 days a week for 1-2 hours.   Complains of feeling down and depressed several times a week, although he is consistently taking his Wellbutrin.  "Sometimes I find myself just crying"  Objective:  Vitals:   02/26/16 1414 02/26/16 1419  BP: (!) 164/94 (!) 170/92  Pulse: 63   SpO2: 96%   Weight: 286 lb 3.2 oz (129.8 kg)   Height: 1.803 m (_0 )    Weight is up 18.4lbs since last visit in July, 2017.  Has been out of Metolazone x 1 week.  Encounter Medications:  Outpatient Encounter Prescriptions as of 02/26/2016  Medication Sig Note  . amLODipine (NORVASC) 5 MG tablet TAKE 1 TABLET BY MOUTH DAILY   . buPROPion (WELLBUTRIN) 75 MG tablet Take 1 tablet (75 mg total) by mouth 2 (two) times daily.   . Marland KitchenIALIS 20 MG tablet TAKE 1/2 TO 1 TABLET BY MOUTH EVERY OTHER DAY AS NEEDED   . ELIQUIS 5 MG TABS tablet TAKE 1 TABLET BY MOUTH 2 TIMES DAILY.   . furosemide (LASIX) 40 MG tablet Take 40 mg by mouth daily.    . insulin aspart (NOVOLOG FLEXPEN) 100 UNIT/ML FlexPen Inject 5 Units into the skin 3 (three) times daily with meals as needed for high blood sugar. Pt uses as needed per sliding scale. Patient is taking 2-8 units tid   . insulin glargine (LANTUS) 100 unit/mL SOPN Inject 15 Units into the skin at bedtime.    . Marland Kitchenisinopril (PRINIVIL,ZESTRIL) 40 MG tablet Take 1 tablet (40 mg total) by mouth daily.   . metolazone (ZAROXOLYN) 2.5 MG tablet Take 2.5 mg by mouth every Monday, Wednesday, and  Friday.  10/12/2015: Takes as needed. Last dose last Thursday or Friday.  . mometasone (NASONEX) 50 MCG/ACT nasal spray Place 2 sprays into both nostrils at bedtime.   . Multiple Vitamin (MULTIVITAMIN WITH MINERALS) TABS tablet Take 1 tablet by mouth daily.   . mycophenolate (MYFORTIC) 180 MG EC tablet Take 540 mg by mouth 2 (two) times daily.    . Marland Kitchenmeprazole (PRILOSEC) 20 MG capsule Take 20 mg by mouth daily.   . potassium chloride (K-DUR,KLOR-CON) 10 MEQ tablet Take 30 mEq by mouth daily.   . rosuvastatin (CRESTOR) 10 MG tablet Take 1 tablet (10 mg total) by mouth daily.   . tacrolimus (PROGRAF) 0.5 MG capsule Take 0.5 mg by mouth 2 (two) times daily.    . Marland Kitchenabapentin (NEURONTIN) 300 MG capsule Take 300 mg by mouth 3 (three) times daily.   . Marland Kitchenmeprazole (PRILOSEC) 20 MG capsule TAKE 1 CAPSULE BY MOUTH ONCE DAILY (Patient not taking: Reported on 02/26/2016)    No facility-administered encounter medications on file as of 02/26/2016.     Functional Status:  In your present state of health, do you have any difficulty performing the following activities: 02/26/2016 10/12/2015  Hearing? N N  Vision? Y N  Difficulty concentrating or making decisions? N N  Walking or climbing stairs? N N  Dressing or bathing? N N  Doing errands, shopping? N N  Some recent data might be hidden    Fall/Depression Screening: PHQ 2/9 Scores 02/26/2016 01/17/2016 01/17/2016 11/29/2015 10/10/2015 07/18/2015 12/06/2014  PHQ - 2 Score 1 0 0 0 2 0 0    Assessment: Discussed the need for consistent medication usage. He told me he is taking 4 - 125m tabs of  Myforic - I have checked with the pharmacy and he is only supposed to be taking 3 tablets bid. I have notified him of this.   His left shin abrasion in now closed. There is a scab on the area (approx. 1" x 2").  The skin on both lower legs is very dark, nearly black around the scab on the left leg. He complains of no pain at the area or on his legs. He does complain of foot  pain when he works.   [facility]  [facility]   Flowsheet Row Most Recent Value  Care Plan Problem Two  Potential for elevated A1C > 7%  Role Documenting the Problem Two  Care Management Coordinator  Care Plan for Problem Two  Active  Interventions for Problem Two Long Term Goal   Discussed medication adherence, site rotation, the need for consistent meals.    [facility]N Long Term Goal (31-90) days  Patient will maintain A1C less than 7%  THN Long Term Goal Start Date  02/26/16     I have asked the patient to take his medications as ordered and to call the MD if he has not decreased his weight by 10 lbs by Friday, December 8th.   Follow up in approximately 1 month.    JGentry Fitz RN, BA, MNorth Bay Shore CLongDirect Dial:  [phone] Fax:  [phone]-mail: [email or web address] [address] [place]  [id number]

## 2016-02-28 ENCOUNTER — Telehealth: Payer: Self-pay

## 2016-02-28 NOTE — Telephone Encounter (Signed)
Received [facility] Note. Per Dr Silvio Pate: Call Pt. Make sure his fluid weight is coming down back on the metolazone   Left message to call office

## 2016-03-05 ENCOUNTER — Telehealth: Payer: Self-pay | Admitting: Cardiovascular Disease

## 2016-03-05 ENCOUNTER — Other Ambulatory Visit: Payer: Self-pay

## 2016-03-05 DIAGNOSIS — E785 Hyperlipidemia, unspecified: Secondary | ICD-10-CM

## 2016-03-05 NOTE — Telephone Encounter (Signed)
Pt calling stating he would like to do his labs at [facility] tomorrow  He is going out of town and it would be easier for him

## 2016-03-05 NOTE — Telephone Encounter (Signed)
Lab order changed to [place] outpatient lab. Left message on pt cell VM

## 2016-03-06 ENCOUNTER — Encounter: Payer: Self-pay | Admitting: Family Medicine

## 2016-03-06 ENCOUNTER — Other Ambulatory Visit
Admission: RE | Admit: 2016-03-06 | Discharge: 2016-03-06 | Disposition: A | Discharge status: Home / Self Care | Payer: 59 | Source: Ambulatory Visit | Attending: Cardiovascular Disease | Admitting: Cardiovascular Disease

## 2016-03-06 ENCOUNTER — Ambulatory Visit (INDEPENDENT_AMBULATORY_CARE_PROVIDER_SITE_OTHER): Payer: 59 | Admitting: Family Medicine

## 2016-03-06 VITALS — BP 144/92 | HR 73 | Temp 97.9°F | Ht 70.0 in | Wt 284.2 lb

## 2016-03-06 DIAGNOSIS — J069 Acute upper respiratory infection, unspecified: Secondary | ICD-10-CM | POA: Insufficient documentation

## 2016-03-06 DIAGNOSIS — I2510 Atherosclerotic heart disease of native coronary artery without angina pectoris: Secondary | ICD-10-CM

## 2016-03-06 DIAGNOSIS — Z940 Kidney transplant status: Secondary | ICD-10-CM | POA: Insufficient documentation

## 2016-03-06 DIAGNOSIS — R059 Cough, unspecified: Secondary | ICD-10-CM

## 2016-03-06 DIAGNOSIS — E785 Hyperlipidemia, unspecified: Secondary | ICD-10-CM | POA: Diagnosis not present

## 2016-03-06 DIAGNOSIS — R05 Cough: Secondary | ICD-10-CM

## 2016-03-06 LAB — HEPATIC FUNCTION PANEL
ALBUMIN: 3.8 g/dL (ref 3.5–5.0)
ALK PHOS: 120 U/L (ref 38–126)
ALT: 14 U/L — ABNORMAL LOW (ref 17–63)
AST: 27 U/L (ref 15–41)
BILIRUBIN INDIRECT: 0.8 mg/dL (ref 0.3–0.9)
Bilirubin, Direct: 0.6 mg/dL — ABNORMAL HIGH (ref 0.1–0.5)
TOTAL PROTEIN: 7.5 g/dL (ref 6.5–8.1)
Total Bilirubin: 1.4 mg/dL — ABNORMAL HIGH (ref 0.3–1.2)

## 2016-03-06 LAB — BASIC METABOLIC PANEL
ANION GAP: 6 (ref 5–15)
BUN: 16 mg/dL (ref 6–20)
CALCIUM: 8.9 mg/dL (ref 8.9–10.3)
CO2: 27 mmol/L (ref 22–32)
CREATININE: 0.97 mg/dL (ref 0.61–1.24)
Chloride: 105 mmol/L (ref 101–111)
Glucose, Bld: 87 mg/dL (ref 65–99)
Potassium: 3.5 mmol/L (ref 3.5–5.1)
SODIUM: 138 mmol/L (ref 135–145)

## 2016-03-06 LAB — LIPID PANEL
Cholesterol: 92 mg/dL (ref 0–200)
HDL: 34 mg/dL — AB (ref >40)
LDL Cholesterol: 43 mg/dL (ref 0–99)
TRIGLYCERIDES: 73 mg/dL (ref <150)
Total CHOL/HDL Ratio: 2.7 RATIO
VLDL: 15 mg/dL (ref 0–40)

## 2016-03-06 LAB — POCT INFLUENZA A/B
INFLUENZA A, POC: NEGATIVE
Influenza B, POC: NEGATIVE

## 2016-03-06 MED ORDER — AZITHROMYCIN 250 MG PO TABS: ORAL_TABLET | ORAL | 0 refills | Status: DC

## 2016-03-06 MED ORDER — HYDROCODONE-HOMATROPINE 5-1.5 MG/5ML PO SYRP: 5.0000 mL | ORAL_SOLUTION | Freq: Four times a day (QID) | ORAL | 0 refills | Status: DC | PRN

## 2016-03-06 MED ORDER — BENZONATATE 200 MG PO CAPS: 200.0000 mg | ORAL_CAPSULE | Freq: Three times a day (TID) | ORAL | 1 refills | Status: AC | PRN

## 2016-03-06 NOTE — Patient Instructions (Signed)
Try the tessalon for cough  And the hycodan for more severe cough - caution of sedation  Rest and fluids  Take the zithromax as directed (antibiotic) for upper respiratory infection  Update if not starting to improve in a week or if worsening

## 2016-03-06 NOTE — Progress Notes (Signed)
Pre visit review using our clinic review tool, if applicable. No additional management support is needed unless otherwise documented below in the visit note.

## 2016-03-06 NOTE — Assessment & Plan Note (Addendum)
Since sat in a diabetic pt with hx of renal transplant in the past and anti rejection medications Re assuring exam  Neg flu swab and no fever today Persistent cough keeping him up - px tessalon and hycodan (caution of sedation)  Fluids/rest mdi prn- alert [name] if inc wheezing  Cover with abx in light of medication status (zpak) Update if not starting to improve in a week or if worsening

## 2016-03-06 NOTE — Progress Notes (Signed)
Subjective:    Patient ID: [name], male    DOB: September 23, 1960, 55 y.o.   MRN: [id number]  HPI Here for uri symptoms  55 yo male pt of Dr Silvio Pate with hx of renal transplant and diabetic On Prograf  Also hx of asthma  Is utd on flu vaccine   Started on sat- very tired  Then cough started  pnd and cough keep him from sleeping  Prod- yellow phlegm  He wheezed a little Monday and Tuesday-has an inhaler  Has not needed it today   Fever -is worse at night  It was 101 max - makes him ache  No fever during the day   He gets bronchitis and pneumonia   L ear hurts  Congested  Throat is just a little sore - pnd makes it worse   Taking robitussin over the counter   In the past tessalon pearles  Hycodan in the past   Results for orders placed or performed in visit on 03/06/16  POCT Influenza A/B  Result Value Ref Range   Influenza A, POC Negative Negative   Influenza B, POC Negative Negative      Patient Active Problem List   Diagnosis Date Noted  . Acute upper respiratory infection 03/06/2016  . Chronic ulcer of left leg ([place]) 10/26/2015  . Cytomegalovirus (CMV) viremia ([place]) 09/07/2015  . Acute renal failure (ARF) ([place]) 08/30/2015  . Proteinuria 06/23/2015  . BPH with obstruction/lower urinary tract symptoms 09/25/2014  . Hypogonadism in male 09/25/2014  . Immunosuppressed status ([place]) 09/09/2014  . Atrial fibrillation ([place]) 07/18/2014  . Bariatric surgery status 04/25/2014  . Chronic systolic heart failure ([place]) 12/13/2013  . DM eye manif type II 10/27/2013  . MDD (recurrent major depressive disorder) in remission ([place]) 10/04/2013  . Benign neoplasm of colon 04/22/2013  . Diverticulosis of colon (without mention of hemorrhage) 04/22/2013  . History of renal transplant 03/01/2013  . Asthma, persistent controlled 02/25/2013  . Erectile dysfunction 10/20/2012  . Obstructive sleep apnea 09/29/2012  . Gout 09/13/2012  . Obesity hypoventilation syndrome ([place])  03/31/2012  . Routine general medical examination at a health care facility 11/12/2011  . Type 2 diabetes, controlled, with renal manifestation ([place]) 03/01/2011  . Essential (primary) hypertension 12/17/2010  . H/O kidney transplant 12/17/2010  . CAD, multiple vessel 12/17/2010  . CAD (coronary artery disease)   . Hypertension   . Hyperlipidemia   . ESRD (end stage renal disease) ([place])    Past Medical History:  Diagnosis Date  . Amnestic MCI (mild cognitive impairment with memory loss)   . Asthma   . Atrial fibrillation ([place])   . Atrial fibrillation ([place]) 2016  . Benign neoplasm of colon   . CAD (coronary artery disease) 2005   had CABG  . CHF (congestive heart failure) ([place])   . Chronic venous stasis dermatitis of both lower extremities   . Cytomegaloviral disease ([place]) 2017  . Depression   . Diabetes mellitus   . Diverticulosis   . Dyspnea   . Erectile dysfunction   . ESRD (end stage renal disease) ([place]) 2005   Dialysis then renal transplant  . GERD (gastroesophageal reflux disease)   . Gout   . Hyperlipidemia    low since renal failure  . Hypertension   . Kidney transplant status, cadaveric 2012  . Obesity   . Pneumonia   . Purpura ([place])   . Sleep apnea    bipap with oxygen 2 l  . Trigger finger   .  Wears glasses    Past Surgical History:  Procedure Laterality Date  . BARIATRIC SURGERY    . CARDIAC CATHETERIZATION     [facility]  . COLONOSCOPY WITH PROPOFOL N/A 04/22/2013   Procedure: COLONOSCOPY WITH PROPOFOL;  Surgeon: [name], MD;  Location: [facility];  Service: Endoscopy;  Laterality: N/A;  . CORONARY ARTERY BYPASS GRAFT  2005   X 4  . DG ANGIO AV SHUNT*L*     right and left upper arms  . FASCIOTOMY  03/03/2012   Procedure: FASCIOTOMY;  Surgeon: [name], MD;  Location: [facility];  Service: Orthopedics;  Laterality: Right;  FASCIOTOMY RIGHT SMALL FINGER  . FASCIOTOMY Left 08/17/2013   Procedure: FASCIOTOMY LEFT RING;  Surgeon:  [name], MD;  Location: [facility];  Service: Orthopedics;  Laterality: Left;  . INCISION AND DRAINAGE ABSCESS Left 10/15/2015   Procedure: INCISION AND DRAINAGE ABSCESS;  Surgeon: [name], MD;  Location: [facility];  Service: General;  Laterality: Left;  . KIDNEY TRANSPLANT  09/13/2010   cadaver--at [facility]  1/12   left  . VASECTOMY     Social History  Substance Use Topics  . Smoking status: Former Smoker    Types: Cigars    Quit date: 09/02/1994  . Smokeless tobacco: Never Used     Comment: cigar- once in a while  . Alcohol use 0.0 - 0.6 oz/week     Comment: occasional- 3 in the past year   Family History  Problem Relation Age of Onset  . Heart disease Father   . Kidney failure Father   . Kidney disease Father   . Diabetes Maternal Grandmother   . Breast cancer Maternal Grandmother   . Liver cancer Paternal Uncle   . Liver cancer Paternal Grandmother   . Prostate cancer Neg Hx    Allergies  Allergen Reactions  . Morphine Itching   Current Outpatient Prescriptions on File Prior to Visit  Medication Sig Dispense Refill  . amLODipine (NORVASC) 5 MG tablet TAKE 1 TABLET BY MOUTH DAILY 90 tablet 1  . buPROPion (WELLBUTRIN) 75 MG tablet Take 1 tablet (75 mg total) by mouth 2 (two) times daily. 180 tablet 3  . CIALIS 20 MG tablet TAKE 1/2 TO 1 TABLET BY MOUTH EVERY OTHER DAY AS NEEDED 5 tablet 11  . ELIQUIS 5 MG TABS tablet TAKE 1 TABLET BY MOUTH 2 TIMES DAILY. 60 tablet 3  . furosemide (LASIX) 40 MG tablet Take 40 mg by mouth daily.     Marland Kitchen gabapentin (NEURONTIN) 300 MG capsule Take 300 mg by mouth 3 (three) times daily.    . insulin aspart (NOVOLOG FLEXPEN) 100 UNIT/ML FlexPen Inject 5 Units into the skin 3 (three) times daily with meals as needed for high blood sugar. Pt uses as needed per sliding scale. Patient is taking 2-8 units tid    . insulin glargine (LANTUS) 100 unit/mL SOPN Inject 15 Units into the skin at bedtime.       Marland Kitchen lisinopril (PRINIVIL,ZESTRIL) 40 MG tablet Take 1 tablet (40 mg total) by mouth daily. 30 tablet 1  . metolazone (ZAROXOLYN) 2.5 MG tablet Take 2.5 mg by mouth every Monday, Wednesday, and Friday.     . mometasone (NASONEX) 50 MCG/ACT nasal spray Place 2 sprays into both nostrils at bedtime.    . Multiple Vitamin (MULTIVITAMIN WITH MINERALS) TABS tablet Take 1 tablet by mouth daily.    . mycophenolate (MYFORTIC)  180 MG EC tablet Take 540 mg by mouth 2 (two) times daily.     Marland Kitchen omeprazole (PRILOSEC) 20 MG capsule TAKE 1 CAPSULE BY MOUTH ONCE DAILY 90 capsule 3  . potassium chloride (K-DUR,KLOR-CON) 10 MEQ tablet Take 30 mEq by mouth daily.    . rosuvastatin (CRESTOR) 10 MG tablet Take 1 tablet (10 mg total) by mouth daily. 90 tablet 3  . tacrolimus (PROGRAF) 0.5 MG capsule Take 0.5 mg by mouth 2 (two) times daily.      No current facility-administered medications on file prior to visit.     Review of Systems  Constitutional: Positive for appetite change, fatigue and fever.  HENT: Positive for congestion, postnasal drip, rhinorrhea, sinus pressure, sneezing and sore throat. Negative for ear pain.   Eyes: Negative for pain and discharge.  Respiratory: Positive for cough. Negative for shortness of breath, wheezing and stridor.   Cardiovascular: Negative for chest pain.  Gastrointestinal: Negative for diarrhea, nausea and vomiting.  Genitourinary: Negative for frequency, hematuria and urgency.  Musculoskeletal: Negative for arthralgias and myalgias.  Skin: Negative for rash.  Neurological: Positive for headaches. Negative for dizziness, weakness and light-headedness.  Psychiatric/Behavioral: Negative for confusion and dysphoric mood.       Objective:   Physical Exam  Constitutional: He appears well-developed and well-nourished. No distress.  overwt and well appearing with persistent cough   HENT:  Head: Normocephalic and atraumatic.  Right Ear: External ear normal.  Left Ear: External  ear normal.  Mouth/Throat: Oropharynx is clear and moist.  Nares are injected and congested  No sinus tenderness Clear rhinorrhea and post nasal drip   Eyes: Conjunctivae and EOM are normal. Pupils are equal, round, and reactive to light. Right eye exhibits no discharge. Left eye exhibits no discharge.  Neck: Normal range of motion. Neck supple.  Cardiovascular: Normal rate and normal heart sounds.   Pulmonary/Chest: Effort normal and breath sounds normal. No respiratory distress. He has no wheezes. He has no rales. He exhibits no tenderness.  No wheeze Harsh bs Good air exch  Harsh persistent cough   Lymphadenopathy:    He has no cervical adenopathy.  Neurological: He is alert.  Skin: Skin is warm and dry. No rash noted.  Psychiatric: He has a normal mood and affect.  Pleasant and talkative           Assessment & Plan:   Problem List Items Addressed This Visit      Respiratory   Acute upper respiratory infection    Since sat in a diabetic pt with hx of renal transplant in the past and anti rejection medications Re assuring exam  Neg flu swab and no fever today Persistent cough keeping him up - px tessalon and hycodan (caution of sedation)  Fluids/rest mdi prn- alert [name] if inc wheezing  Cover with abx in light of medication status (zpak) Update if not starting to improve in a week or if worsening         Relevant Medications   azithromycin (ZITHROMAX Z-PAK) 250 MG tablet    Other Visit Diagnoses    Cough    -  Primary   Relevant Orders   POCT Influenza A/B (Completed)

## 2016-03-07 DIAGNOSIS — Z940 Kidney transplant status: Secondary | ICD-10-CM | POA: Diagnosis not present

## 2016-03-07 DIAGNOSIS — Z4822 Encounter for aftercare following kidney transplant: Secondary | ICD-10-CM | POA: Diagnosis not present

## 2016-03-08 ENCOUNTER — Other Ambulatory Visit: Payer: 59

## 2016-03-19 ENCOUNTER — Telehealth: Payer: Self-pay

## 2016-03-19 MED ORDER — HYDROCODONE-HOMATROPINE 5-1.5 MG/5ML PO SYRP: 5.0000 mL | ORAL_SOLUTION | Freq: Four times a day (QID) | ORAL | 0 refills | Status: DC | PRN

## 2016-03-19 MED ORDER — AZITHROMYCIN 250 MG PO TABS: ORAL_TABLET | ORAL | 0 refills | Status: DC

## 2016-03-19 NOTE — Telephone Encounter (Signed)
Pt notified Rx sent and that Rx for hycodan is ready for pick up and f/u with Dr. Silvio Pate scheduled

## 2016-03-19 NOTE — Telephone Encounter (Signed)
Pt left v/m; pt last seen 03/06/16; pt continues with excessive cough and is out of cough med, has prod cough with yellow phlegm,chills. Pt request refill of zpak and cough med. Pt request cb. [facility].

## 2016-03-19 NOTE — Telephone Encounter (Signed)
I sent in the zithromax The hycodan is printed  He needs f/u with a re check as well-please schedule this week with PCP or first available  He may need a chest xray  Will cc to pcp as well

## 2016-03-21 ENCOUNTER — Ambulatory Visit: Payer: 59 | Admitting: Internal Medicine

## 2016-03-27 DIAGNOSIS — D259 Leiomyoma of uterus, unspecified: Secondary | ICD-10-CM | POA: Diagnosis not present

## 2016-03-27 DIAGNOSIS — D252 Subserosal leiomyoma of uterus: Secondary | ICD-10-CM | POA: Diagnosis not present

## 2016-03-27 DIAGNOSIS — E662 Morbid (severe) obesity with alveolar hypoventilation: Secondary | ICD-10-CM | POA: Diagnosis not present

## 2016-03-27 DIAGNOSIS — G4733 Obstructive sleep apnea (adult) (pediatric): Secondary | ICD-10-CM | POA: Diagnosis not present

## 2016-03-27 DIAGNOSIS — D251 Intramural leiomyoma of uterus: Secondary | ICD-10-CM | POA: Diagnosis not present

## 2016-04-04 ENCOUNTER — Telehealth: Payer: Self-pay | Admitting: Pulmonary Disease

## 2016-04-04 DIAGNOSIS — G4733 Obstructive sleep apnea (adult) (pediatric): Secondary | ICD-10-CM

## 2016-04-04 NOTE — Telephone Encounter (Signed)
Pt is requesting an order to be sent to [facility] to renew cpap supplies. Pt was last seen in 2015. I offered pt an apt with VS, pt states he would prefer to see BQ as he is comfortable with him, as he had seen him previously in the [employer].  Pt has been scheduled to f/u with BQ on 05-10-16.  BQ please okay if okay with renewing cpap supplies. Thanks.

## 2016-04-07 NOTE — Telephone Encounter (Signed)
OK to order

## 2016-04-08 NOTE — Telephone Encounter (Signed)
Order placed to [facility]. Pt aware and voiced understanding. Nothing further needed.

## 2016-04-08 NOTE — Telephone Encounter (Signed)
Patient is returning phone call.  °

## 2016-04-08 NOTE — Telephone Encounter (Signed)
Lmtcb. Will place order after speaking with pt to verify DME.

## 2016-04-18 ENCOUNTER — Other Ambulatory Visit: Payer: Self-pay | Admitting: Cardiovascular Disease

## 2016-04-25 DIAGNOSIS — G4733 Obstructive sleep apnea (adult) (pediatric): Secondary | ICD-10-CM | POA: Diagnosis not present

## 2016-04-25 DIAGNOSIS — E662 Morbid (severe) obesity with alveolar hypoventilation: Secondary | ICD-10-CM | POA: Diagnosis not present

## 2016-04-25 DIAGNOSIS — L0390 Cellulitis, unspecified: Secondary | ICD-10-CM | POA: Diagnosis not present

## 2016-04-25 DIAGNOSIS — I482 Chronic atrial fibrillation: Secondary | ICD-10-CM | POA: Diagnosis not present

## 2016-04-27 DIAGNOSIS — G4733 Obstructive sleep apnea (adult) (pediatric): Secondary | ICD-10-CM | POA: Diagnosis not present

## 2016-04-27 DIAGNOSIS — E662 Morbid (severe) obesity with alveolar hypoventilation: Secondary | ICD-10-CM | POA: Diagnosis not present

## 2016-04-27 DIAGNOSIS — I482 Chronic atrial fibrillation: Secondary | ICD-10-CM | POA: Diagnosis not present

## 2016-05-02 DIAGNOSIS — I482 Chronic atrial fibrillation: Secondary | ICD-10-CM | POA: Diagnosis not present

## 2016-05-02 DIAGNOSIS — L0390 Cellulitis, unspecified: Secondary | ICD-10-CM | POA: Diagnosis not present

## 2016-05-02 DIAGNOSIS — E662 Morbid (severe) obesity with alveolar hypoventilation: Secondary | ICD-10-CM | POA: Diagnosis not present

## 2016-05-02 DIAGNOSIS — G4733 Obstructive sleep apnea (adult) (pediatric): Secondary | ICD-10-CM | POA: Diagnosis not present

## 2016-05-03 ENCOUNTER — Encounter: Payer: Self-pay | Admitting: Cardiovascular Disease

## 2016-05-03 ENCOUNTER — Ambulatory Visit (INDEPENDENT_AMBULATORY_CARE_PROVIDER_SITE_OTHER): Payer: 59 | Admitting: Cardiovascular Disease

## 2016-05-03 VITALS — BP 120/70 | HR 67 | Ht 71.0 in | Wt 291.8 lb

## 2016-05-03 DIAGNOSIS — E785 Hyperlipidemia, unspecified: Secondary | ICD-10-CM

## 2016-05-03 DIAGNOSIS — I482 Chronic atrial fibrillation, unspecified: Secondary | ICD-10-CM

## 2016-05-03 DIAGNOSIS — I2510 Atherosclerotic heart disease of native coronary artery without angina pectoris: Secondary | ICD-10-CM

## 2016-05-03 DIAGNOSIS — G4733 Obstructive sleep apnea (adult) (pediatric): Secondary | ICD-10-CM | POA: Diagnosis not present

## 2016-05-03 DIAGNOSIS — I5022 Chronic systolic (congestive) heart failure: Secondary | ICD-10-CM | POA: Diagnosis not present

## 2016-05-03 DIAGNOSIS — L0390 Cellulitis, unspecified: Secondary | ICD-10-CM | POA: Diagnosis not present

## 2016-05-03 DIAGNOSIS — E662 Morbid (severe) obesity with alveolar hypoventilation: Secondary | ICD-10-CM | POA: Diagnosis not present

## 2016-05-03 NOTE — Progress Notes (Signed)
Cardiology Office Note   Date:  05/03/2016   ID:  Hilliard, Borges 08-Nov-1960, MRN [id number]  PCP:  [name], MD  Cardiologist:   [name], MD   Chief Complaint  Patient presents with  . other    3 month follow up. Meds reviewed by the pt. verbally. "doing well."       History of Present Illness: [name] is a 56 y.o. male who presents for  a follow up visit. He has known history of coronary artery disease status post CABG in 2004 with no recurrent ischemic events since then, chronic systolic heart failure with ejection fraction of 35-40 %, type 2 diabetes, end-stage renal disease previously on hemodialysis from 2004 - 2012 when he got a kidney transplant, hypertension, hyperlipidemia, chronic atrial fibrillation and sleep apnea.   Most recent echocardiogram in July 2016 showed an EF of 35-40%, severely dilated left atrium and mild pulmonary hypertension. She underwent gastric bypass in September 5638 without complications.  He did have significant bradycardia while he was on small dose carvedilol which was discontinued. He has been doing well overall with no chest pain or shortness of breath. No recent hospitalizations. He exercises daily at the gym without significant limitations.   Past Medical History:  Diagnosis Date  . Amnestic MCI (mild cognitive impairment with memory loss)   . Asthma   . Atrial fibrillation ([place])   . Atrial fibrillation ([place]) 2016  . Benign neoplasm of colon   . CAD (coronary artery disease) 2005   had CABG  . CHF (congestive heart failure) ([place])   . Chronic venous stasis dermatitis of both lower extremities   . Cytomegaloviral disease ([place]) 2017  . Depression   . Diabetes mellitus   . Diverticulosis   . Dyspnea   . Erectile dysfunction   . ESRD (end stage renal disease) ([place]) 2005   Dialysis then renal transplant  . GERD (gastroesophageal reflux disease)   . Gout   . Hyperlipidemia    low since renal failure  .  Hypertension   . Kidney transplant status, cadaveric 2012  . Obesity   . Pneumonia   . Purpura ([place])   . Sleep apnea    bipap with oxygen 2 l  . Trigger finger   . Wears glasses     Past Surgical History:  Procedure Laterality Date  . BARIATRIC SURGERY    . CARDIAC CATHETERIZATION     [facility]  . COLONOSCOPY WITH PROPOFOL N/A 04/22/2013   Procedure: COLONOSCOPY WITH PROPOFOL;  Surgeon: [name], MD;  [facility]: [facility];  Service: Endoscopy;  Laterality: N/A;  . CORONARY ARTERY BYPASS GRAFT  2005   X 4  . DG ANGIO AV SHUNT*L*     right and left upper arms  . FASCIOTOMY  03/03/2012   Procedure: FASCIOTOMY;  Surgeon: [name], MD;  Location: [place];  Service: Orthopedics;  Laterality: Right;  FASCIOTOMY RIGHT SMALL FINGER  . FASCIOTOMY Left 08/17/2013   Procedure: FASCIOTOMY LEFT RING;  Surgeon: [name], MD;  Location: [facility];  Service: Orthopedics;  Laterality: Left;  . INCISION AND DRAINAGE ABSCESS Left 10/15/2015   Procedure: INCISION AND DRAINAGE ABSCESS;  Surgeon: [name], MD;  Location: [facility];  Service: General;  Laterality: Left;  . KIDNEY TRANSPLANT  09/13/2010   cadaver--at [facility]  1/12   left  . VASECTOMY  Current Outpatient Prescriptions  Medication Sig Dispense Refill  . amLODipine (NORVASC) 5 MG tablet TAKE 1 TABLET BY MOUTH DAILY 90 tablet 1  . buPROPion (WELLBUTRIN) 75 MG tablet Take 1 tablet (75 mg total) by mouth 2 (two) times daily. 180 tablet 3  . CIALIS 20 MG tablet TAKE 1/2 TO 1 TABLET BY MOUTH EVERY OTHER DAY AS NEEDED 5 tablet 11  . ELIQUIS 5 MG TABS tablet TAKE 1 TABLET BY MOUTH 2 TIMES DAILY. 60 tablet 3  . furosemide (LASIX) 40 MG tablet Take 40 mg by mouth daily.     Marland Kitchen gabapentin (NEURONTIN) 300 MG capsule Take 300 mg by mouth 3 (three) times daily.    . insulin aspart (NOVOLOG FLEXPEN) 100 UNIT/ML FlexPen Inject 5 Units into the skin 3 (three) times daily  with meals as needed for high blood sugar. Pt uses as needed per sliding scale. Patient is taking 2-8 units tid    . insulin glargine (LANTUS) 100 unit/mL SOPN Inject 15 Units into the skin at bedtime.     Marland Kitchen lisinopril (PRINIVIL,ZESTRIL) 40 MG tablet TAKE 1 TABLET BY MOUTH DAILY 30 tablet 0  . metolazone (ZAROXOLYN) 2.5 MG tablet Take 2.5 mg by mouth every Monday, Wednesday, and Friday.     . mometasone (NASONEX) 50 MCG/ACT nasal spray Place 2 sprays into both nostrils at bedtime.    . Multiple Vitamin (MULTIVITAMIN WITH MINERALS) TABS tablet Take 1 tablet by mouth daily.    . mycophenolate (MYFORTIC) 180 MG EC tablet Take 540 mg by mouth 2 (two) times daily.     Marland Kitchen omeprazole (PRILOSEC) 20 MG capsule TAKE 1 CAPSULE BY MOUTH ONCE DAILY 90 capsule 3  . potassium chloride (K-DUR,KLOR-CON) 10 MEQ tablet Take 30 mEq by mouth daily.    . tacrolimus (PROGRAF) 0.5 MG capsule Take 0.5 mg by mouth 2 (two) times daily.     . rosuvastatin (CRESTOR) 10 MG tablet Take 1 tablet (10 mg total) by mouth daily. 90 tablet 3   No current facility-administered medications for this visit.     Allergies:   Morphine    Social History:  The patient  reports that he quit smoking about 21 years ago. His smoking use included Cigars. He has never used smokeless tobacco. He reports that he drinks alcohol. He reports that he does not use drugs.   Family History:  The patient's family history includes Breast cancer in his maternal grandmother; Diabetes in his maternal grandmother; Heart disease in his father; Kidney disease in his father; Kidney failure in his father; Liver cancer in his paternal grandmother and paternal uncle.    ROS:  Please see the history of present illness.   Otherwise, review of systems are positive for none.   All other systems are reviewed and negative.    PHYSICAL EXAM: VS:  BP 120/70 (BP Location: Left Arm, Patient Position: Sitting, Cuff Size: Large)   Pulse 67   Ht _0  (1.803 m)   Wt 291  lb 12 oz (132.3 kg)   BMI 40.69 kg/m  , BMI Body mass index is 40.69 kg/m. GEN: Well nourished, well developed, in no acute distress  HEENT: normal  Neck: no JVD, carotid bruits, or masses Cardiac: Irregularly irregular; no  rubs, or gallops,no edema . One out of 6 systolic ejection murmur in the aortic area. Respiratory:  clear to auscultation bilaterally, normal work of breathing GI: soft, nontender, nondistended, + BS MS: no deformity or atrophy  Skin: warm and dry,  no rash Neuro:  Strength and sensation are intact Psych: euthymic mood, full affect   EKG:  EKG is ordered today. The ekg ordered today demonstrates atrial fibrillation with nonspecific IVCD. PVC.   Recent Labs: 10/18/2015: Hemoglobin 11.7; Platelets 165 03/06/2016: ALT 14; BUN 16; Creatinine, Ser 0.97; Potassium 3.5; Sodium 138    Lipid Panel    Component Value Date/Time   CHOL 92 03/06/2016 0736   CHOL 161 03/07/2015 0823   TRIG 73 03/06/2016 0736   HDL 34 (L) 03/06/2016 0736   HDL 38 (L) 03/07/2015 0823   CHOLHDL 2.7 03/06/2016 0736   VLDL 15 03/06/2016 0736   LDLCALC 43 03/06/2016 0736   LDLCALC 105 (H) 03/07/2015 0823   LDLDIRECT 41.9 04/20/2012 1618      Wt Readings from Last 3 Encounters:  05/03/16 291 lb 12 oz (132.3 kg)  03/06/16 284 lb 4 oz (128.9 kg)  02/26/16 286 lb 3.2 oz (129.8 kg)       No flowsheet data found.    ASSESSMENT AND PLAN:  1.  Chronic systolic heart failure: He appears to be euvolemic. He is not on carvedilol due to bradycardia while he was on small dose. Continue treatment with lisinopril . Most recent ejection fraction was 35-40%. We can consider adding spironolactone at some point.   2. Chronic atrial fibrillation: Rate is controlled without medications. He is tolerating anticoagulation with Eliquis.  3. Coronary artery disease involving native coronary arteries without angina: Continue medical therapy.  4. Hyperlipidemia:  Lipid profile improved significantly  with rosuvastatin 10 mg daily. LDL was 43.   Disposition:   FU with me in 6 months  Signed,  [name], MD  05/03/2016 10:34 AM    [place]

## 2016-05-03 NOTE — Patient Instructions (Signed)
Medication Instructions: Continue same medications.   Labwork: None.   Procedures/Testing: None.   Follow-Up: 6 months with [name].   Any Additional Special Instructions Will Be Listed Below (If Applicable).     If you need a refill on your cardiac medications before your next appointment, please call your pharmacy.

## 2016-05-10 ENCOUNTER — Encounter: Payer: Self-pay | Admitting: Pulmonary Disease

## 2016-05-10 ENCOUNTER — Ambulatory Visit (INDEPENDENT_AMBULATORY_CARE_PROVIDER_SITE_OTHER): Payer: 59 | Admitting: Pulmonary Disease

## 2016-05-10 VITALS — BP 134/78 | HR 63 | Ht 71.0 in | Wt 289.0 lb

## 2016-05-10 DIAGNOSIS — G4733 Obstructive sleep apnea (adult) (pediatric): Secondary | ICD-10-CM | POA: Diagnosis not present

## 2016-05-10 DIAGNOSIS — J45998 Other asthma: Secondary | ICD-10-CM | POA: Diagnosis not present

## 2016-05-10 DIAGNOSIS — E662 Morbid (severe) obesity with alveolar hypoventilation: Secondary | ICD-10-CM

## 2016-05-10 DIAGNOSIS — I2510 Atherosclerotic heart disease of native coronary artery without angina pectoris: Secondary | ICD-10-CM

## 2016-05-10 MED ORDER — BECLOMETHASONE DIPROPIONATE 80 MCG/ACT IN AERS: 2.0000 | INHALATION_SPRAY | Freq: Two times a day (BID) | RESPIRATORY_TRACT | 6 refills | Status: DC

## 2016-05-10 NOTE — Patient Instructions (Signed)
Use CPAP every night We will arrange for an overnight oxygen test We will request a download of your CPAP compliance Exercise regular, try to lose weight Take Qvar 2 puffs twice a day no matter how you feel We will see you back in one year in the [place] office

## 2016-05-10 NOTE — Assessment & Plan Note (Signed)
He remains compliant with his BiPAP therapy and has been feeling well while using it. The only question I have is whether or not he needs to be using oxygen at night because previously has overnight oximetry testing with BiPAP showed persistent hypoxemia.  Plan: Renew prescription for BiPAP supplies Check overnight oximetry while wearing BiPAP  Download report of compliance with BiPAP

## 2016-05-10 NOTE — Assessment & Plan Note (Signed)
We will renew his Qvar as he is typically more symptomatic in the spring. He continues Qvar 2 puffs twice a day during the springtime, then stop late summer. He was instructed to use albuterol on an as-needed basis for

## 2016-05-10 NOTE — Progress Notes (Signed)
Subjective:    Patient ID: [name], male    DOB: Oct 22, 1960, 56 y.o.   MRN: [id number]  Synopsis: [name] was previously followed by [name] for asthma and obesity hypoventilation syndrome with OSA.  History of renal transplant. PSG 03/15/12 >> AHI 57.9, low SpO2 78%. CPAP 12 cm H2O  CT chest [facility]) 09/24/12 >> central bronchial thickening, LUL pleural scarring  V/Q scan [facility]) 09/24/12 >> matched defect Lt upper lobe  ABG [facility]) 09/25/12 >> pH 7.40, PaCO2 68.2, PaO2 61.5  Echo bubble study [facility]) 09/26/12 >> poor study, no identified shunt  Room air SpO2 10/27/12 >> 87%, start 2 liters oxygen 24/7  ONO with CPAP and RA 11/02/12 >> Test time 1 hr 22 min. Mean SpO2 76%, low SpO2 59%. Spent 1 hr 16 min with SpO2 < 88%.  CPAP 10/15/12 to 11/13/12 >> used on 30 of 30 nights with average 4 hrs 22 min. Average AHI 3.8 with CPAP 12 cm H2O.  PFT 11/27/12 >> FEV1 1.74, FEV1% 80%, TLC 4.16 (60%), DLCO 81%, +BD  CPAP 11/16/12 to 12/15/12 >> Used on 30 of 30 nights with average 3 hrs 56 min. Average AHI 3.7 with CPAP 12 cm H2O.  ONO with CPAP and 2 liters 11/27/12 >> Test time 4 hrs 39 min. Basal SpO2 88%, low SpO2 67%. Spent 117 min with SpO2 < 88%  Echo 04/27/13 >> EF 35 to 40%, mod LVH, grade 2 diastolic dysfunction, severe LA dilation, mod RA dilation  ABG on room air 05/12/13 >> pH 7.41, PaCO2 70, PaO2 < 43  BiPAP 06/21/13 >> 14/9 cm H2O with 2 liters oxygen >> AHI 0.   HPI  Chief Complaint  Patient presents with  . Follow-up    last seen 12/2013- pt needs office visit for cpap supplies.  pt doing well on cpap, no complaints.  Pt uses [facility].     [name] is here to see me today to follow up with his obesity hypoventilation syndrome.  He says that he feels well rested and is sleeping 7.5 hours a night.  No morning headaches, napping or trouble sleeping.  He is still using BIPAP every night and he says it really helps.  He is not using oxygen at night.   No trouble breathing at all.   He says that he has not had an asthma flare up but he feels that his allergies are worsening.      Past Medical History:  Diagnosis Date  . Amnestic MCI (mild cognitive impairment with memory loss)   . Asthma   . Atrial fibrillation ([place])   . Atrial fibrillation ([place]) 2016  . Benign neoplasm of colon   . CAD (coronary artery disease) 2005   had CABG  . CHF (congestive heart failure) ([place])   . Chronic venous stasis dermatitis of both lower extremities   . Cytomegaloviral disease ([place]) 2017  . Depression   . Diabetes mellitus   . Diverticulosis   . Dyspnea   . Erectile dysfunction   . ESRD (end stage renal disease) ([place]) 2005   Dialysis then renal transplant  . GERD (gastroesophageal reflux disease)   . Gout   . Hyperlipidemia    low since renal failure  . Hypertension   . Kidney transplant status, cadaveric 2012  . Obesity   . Pneumonia   . Purpura ([place])   . Sleep apnea    bipap with oxygen 2 l  . Trigger finger   . Wears  glasses      Review of Systems  Constitutional: Negative for fever and unexpected weight change.  HENT: Negative for congestion, dental problem, ear pain, nosebleeds, postnasal drip, rhinorrhea, sinus pressure, sneezing, sore throat and trouble swallowing.   Eyes: Negative for redness and itching.  Respiratory: Positive for shortness of breath. Negative for cough, chest tightness and wheezing.   Cardiovascular: Negative for palpitations and leg swelling.  Gastrointestinal: Negative for nausea and vomiting.  Genitourinary: Negative for dysuria.  Musculoskeletal: Negative for joint swelling.  Skin: Negative for rash.  Neurological: Negative for headaches.  Hematological: Does not bruise/bleed easily.  Psychiatric/Behavioral: Negative for dysphoric mood. The patient is not nervous/anxious.        Objective:   Physical Exam Vitals:   05/10/16 1151  BP: 134/78  BP Location: Right Arm  Cuff Size: Normal  Pulse: 63  SpO2: 94%  Weight: 289 lb  (131.1 kg)  Height: 5' 11" (1.803 m)   RA  Gen: well appearing HENT: OP clear, TM's clear, neck supple PULM: CTA B, normal percussion CV: RRR, no mgr, trace edema GI: BS+, soft, nontender Derm: no cyanosis or rash Psyche: normal mood and affect       Assessment & Plan:   Asthma, persistent controlled We will renew his Qvar as he is typically more symptomatic in the spring. He continues Qvar 2 puffs twice a day during the springtime, then stop late summer. He was instructed to use albuterol on an as-needed basis for  Obesity hypoventilation syndrome ([place]) He remains compliant with his BiPAP therapy and has been feeling well while using it. The only question I have is whether or not he needs to be using oxygen at night because previously has overnight oximetry testing with BiPAP showed persistent hypoxemia.  Plan: Renew prescription for BiPAP supplies Check overnight oximetry while wearing BiPAP  Download report of compliance with BiPAP   Updated Medication List Outpatient Encounter Prescriptions as of 05/10/2016  Medication Sig  . amLODipine (NORVASC) 5 MG tablet TAKE 1 TABLET BY MOUTH DAILY  . buPROPion (WELLBUTRIN) 75 MG tablet Take 1 tablet (75 mg total) by mouth 2 (two) times daily.  Marland Kitchen CIALIS 20 MG tablet TAKE 1/2 TO 1 TABLET BY MOUTH EVERY OTHER DAY AS NEEDED  . ELIQUIS 5 MG TABS tablet TAKE 1 TABLET BY MOUTH 2 TIMES DAILY.  . furosemide (LASIX) 40 MG tablet Take 40 mg by mouth daily.   Marland Kitchen gabapentin (NEURONTIN) 300 MG capsule Take 300 mg by mouth 3 (three) times daily.  . insulin aspart (NOVOLOG FLEXPEN) 100 UNIT/ML FlexPen Inject 5 Units into the skin 3 (three) times daily with meals as needed for high blood sugar. Pt uses as needed per sliding scale. Patient is taking 2-8 units tid  . insulin glargine (LANTUS) 100 unit/mL SOPN Inject 15 Units into the skin at bedtime.   Marland Kitchen lisinopril (PRINIVIL,ZESTRIL) 40 MG tablet TAKE 1 TABLET BY MOUTH DAILY  . metolazone (ZAROXOLYN)  2.5 MG tablet Take 2.5 mg by mouth every Monday, Wednesday, and Friday.   . mometasone (NASONEX) 50 MCG/ACT nasal spray Place 2 sprays into both nostrils at bedtime.  . Multiple Vitamin (MULTIVITAMIN WITH MINERALS) TABS tablet Take 1 tablet by mouth daily.  . mycophenolate (MYFORTIC) 180 MG EC tablet Take 540 mg by mouth 2 (two) times daily.   Marland Kitchen omeprazole (PRILOSEC) 20 MG capsule TAKE 1 CAPSULE BY MOUTH ONCE DAILY  . potassium chloride (K-DUR,KLOR-CON) 10 MEQ tablet Take 30 mEq by mouth daily.  Marland Kitchen  tacrolimus (PROGRAF) 0.5 MG capsule Take 0.5 mg by mouth 2 (two) times daily.   . beclomethasone (QVAR) 80 MCG/ACT inhaler Inhale 2 puffs into the lungs 2 (two) times daily.  . rosuvastatin (CRESTOR) 10 MG tablet Take 1 tablet (10 mg total) by mouth daily.   No facility-administered encounter medications on file as of 05/10/2016.

## 2016-05-14 ENCOUNTER — Ambulatory Visit: Payer: 59 | Admitting: Internal Medicine

## 2016-05-14 DIAGNOSIS — E1122 Type 2 diabetes mellitus with diabetic chronic kidney disease: Secondary | ICD-10-CM | POA: Diagnosis not present

## 2016-05-14 DIAGNOSIS — Z4822 Encounter for aftercare following kidney transplant: Secondary | ICD-10-CM | POA: Diagnosis not present

## 2016-05-14 DIAGNOSIS — Z79899 Other long term (current) drug therapy: Secondary | ICD-10-CM | POA: Diagnosis not present

## 2016-05-14 DIAGNOSIS — I2510 Atherosclerotic heart disease of native coronary artery without angina pectoris: Secondary | ICD-10-CM | POA: Diagnosis not present

## 2016-05-14 DIAGNOSIS — Z792 Long term (current) use of antibiotics: Secondary | ICD-10-CM | POA: Diagnosis not present

## 2016-05-14 DIAGNOSIS — Z885 Allergy status to narcotic agent status: Secondary | ICD-10-CM | POA: Diagnosis not present

## 2016-05-14 DIAGNOSIS — N186 End stage renal disease: Secondary | ICD-10-CM | POA: Diagnosis not present

## 2016-05-14 DIAGNOSIS — R918 Other nonspecific abnormal finding of lung field: Secondary | ICD-10-CM | POA: Diagnosis not present

## 2016-05-14 DIAGNOSIS — D899 Disorder involving the immune mechanism, unspecified: Secondary | ICD-10-CM | POA: Diagnosis not present

## 2016-05-14 DIAGNOSIS — E119 Type 2 diabetes mellitus without complications: Secondary | ICD-10-CM | POA: Diagnosis not present

## 2016-05-14 DIAGNOSIS — I517 Cardiomegaly: Secondary | ICD-10-CM | POA: Diagnosis not present

## 2016-05-14 DIAGNOSIS — D8989 Other specified disorders involving the immune mechanism, not elsewhere classified: Secondary | ICD-10-CM | POA: Diagnosis not present

## 2016-05-14 DIAGNOSIS — J069 Acute upper respiratory infection, unspecified: Secondary | ICD-10-CM | POA: Diagnosis not present

## 2016-05-14 DIAGNOSIS — I1311 Hypertensive heart and chronic kidney disease without heart failure, with stage 5 chronic kidney disease, or end stage renal disease: Secondary | ICD-10-CM | POA: Diagnosis not present

## 2016-05-14 DIAGNOSIS — Z794 Long term (current) use of insulin: Secondary | ICD-10-CM | POA: Diagnosis not present

## 2016-05-14 DIAGNOSIS — I10 Essential (primary) hypertension: Secondary | ICD-10-CM | POA: Diagnosis not present

## 2016-05-14 DIAGNOSIS — Z7901 Long term (current) use of anticoagulants: Secondary | ICD-10-CM | POA: Diagnosis not present

## 2016-05-14 DIAGNOSIS — Z940 Kidney transplant status: Secondary | ICD-10-CM | POA: Diagnosis not present

## 2016-05-14 DIAGNOSIS — B9789 Other viral agents as the cause of diseases classified elsewhere: Secondary | ICD-10-CM | POA: Diagnosis not present

## 2016-05-21 ENCOUNTER — Other Ambulatory Visit: Payer: Self-pay | Admitting: Cardiovascular Disease

## 2016-05-23 ENCOUNTER — Ambulatory Visit (INDEPENDENT_AMBULATORY_CARE_PROVIDER_SITE_OTHER): Payer: 59 | Admitting: Internal Medicine

## 2016-05-23 ENCOUNTER — Encounter: Payer: Self-pay | Admitting: Internal Medicine

## 2016-05-23 VITALS — BP 144/80 | HR 77 | Temp 97.9°F | Wt 282.0 lb

## 2016-05-23 DIAGNOSIS — I2510 Atherosclerotic heart disease of native coronary artery without angina pectoris: Secondary | ICD-10-CM | POA: Diagnosis not present

## 2016-05-23 DIAGNOSIS — J101 Influenza due to other identified influenza virus with other respiratory manifestations: Secondary | ICD-10-CM

## 2016-05-23 DIAGNOSIS — J329 Chronic sinusitis, unspecified: Secondary | ICD-10-CM

## 2016-05-23 DIAGNOSIS — L97821 Non-pressure chronic ulcer of other part of left lower leg limited to breakdown of skin: Secondary | ICD-10-CM

## 2016-05-23 DIAGNOSIS — B9789 Other viral agents as the cause of diseases classified elsewhere: Secondary | ICD-10-CM

## 2016-05-23 DIAGNOSIS — E11622 Type 2 diabetes mellitus with other skin ulcer: Secondary | ICD-10-CM

## 2016-05-23 DIAGNOSIS — L97921 Non-pressure chronic ulcer of unspecified part of left lower leg limited to breakdown of skin: Secondary | ICD-10-CM

## 2016-05-23 DIAGNOSIS — E10622 Type 1 diabetes mellitus with other skin ulcer: Secondary | ICD-10-CM

## 2016-05-23 DIAGNOSIS — IMO0002 Reserved for concepts with insufficient information to code with codable children: Secondary | ICD-10-CM

## 2016-05-23 HISTORY — DX: Reserved for concepts with insufficient information to code with codable children: IMO0002

## 2016-05-23 MED ORDER — HYDROCODONE-HOMATROPINE 5-1.5 MG/5ML PO SYRP: 5.0000 mL | ORAL_SOLUTION | Freq: Three times a day (TID) | ORAL | 0 refills | Status: DC | PRN

## 2016-05-23 NOTE — Progress Notes (Signed)
Subjective:    Patient ID: [name], male    DOB: 15-Oct-1960, 56 y.o.   MRN: [id number]  HPI  Pt presents to the clinic today for ER followup. He went to the ER 05/14/16 with c/o fever, cough, shortness of breath and dizziness. He wad diagnosed with Influenza A. His chest xray was negative for pneumonia. They prescribed him Tamiflu and discharged him home. Since that time, he reports he started feeling better while on the Tamiflu. He started feeling bad again 1 week ago. He c/o fatigue and chills. He has associated runny nose and cough. He is blowing clear mucous out of his nose. The cough is productive of yellow mucous. He reports he has run fevers up to 101.0 degrees. He has tried Robitussin OTC with minimal relief. He has a history of CHF, asthma and DM 2. He is UTD with flu and pneumonia vaccines. He is immunosuppressed, kidney transplant.   Additionally, he would like me to look at a wound on his left anterior shin. He reports it started out as a blister 1 week ago. It popped, ulcerated and now won't heal. He has put Neosporin on it without any relief. He has a history of DM 2.  Review of Systems      Past Medical History:  Diagnosis Date  . Amnestic MCI (mild cognitive impairment with memory loss)   . Asthma   . Atrial fibrillation ([place])   . Atrial fibrillation ([place]) 2016  . Benign neoplasm of colon   . CAD (coronary artery disease) 2005   had CABG  . CHF (congestive heart failure) ([place])   . Chronic venous stasis dermatitis of both lower extremities   . Cytomegaloviral disease ([place]) 2017  . Depression   . Diabetes mellitus   . Diverticulosis   . Dyspnea   . Erectile dysfunction   . ESRD (end stage renal disease) ([place]) 2005   Dialysis then renal transplant  . GERD (gastroesophageal reflux disease)   . Gout   . Hyperlipidemia    low since renal failure  . Hypertension   . Kidney transplant status, cadaveric 2012  . Obesity   . Pneumonia   . Purpura ([place])   . Sleep  apnea    bipap with oxygen 2 l  . Trigger finger   . Wears glasses     Current Outpatient Prescriptions  Medication Sig Dispense Refill  . amLODipine (NORVASC) 5 MG tablet TAKE 1 TABLET BY MOUTH DAILY 90 tablet 1  . beclomethasone (QVAR) 80 MCG/ACT inhaler Inhale 2 puffs into the lungs 2 (two) times daily. 1 Inhaler 6  . buPROPion (WELLBUTRIN) 75 MG tablet Take 1 tablet (75 mg total) by mouth 2 (two) times daily. 180 tablet 3  . CIALIS 20 MG tablet TAKE 1/2 TO 1 TABLET BY MOUTH EVERY OTHER DAY AS NEEDED 5 tablet 11  . ELIQUIS 5 MG TABS tablet TAKE 1 TABLET BY MOUTH 2 TIMES DAILY. 60 tablet 3  . furosemide (LASIX) 40 MG tablet Take 40 mg by mouth daily.     Marland Kitchen gabapentin (NEURONTIN) 300 MG capsule Take 300 mg by mouth 3 (three) times daily.    . insulin aspart (NOVOLOG FLEXPEN) 100 UNIT/ML FlexPen Inject 5 Units into the skin 3 (three) times daily with meals as needed for high blood sugar. Pt uses as needed per sliding scale. Patient is taking 2-8 units tid    . insulin glargine (LANTUS) 100 unit/mL SOPN Inject 15 Units into the skin at  bedtime.     Marland Kitchen lisinopril (PRINIVIL,ZESTRIL) 40 MG tablet TAKE 1 TABLET BY MOUTH DAILY 30 tablet 0  . metolazone (ZAROXOLYN) 2.5 MG tablet Take 2.5 mg by mouth every Monday, Wednesday, and Friday.     . mometasone (NASONEX) 50 MCG/ACT nasal spray Place 2 sprays into both nostrils at bedtime.    . Multiple Vitamin (MULTIVITAMIN WITH MINERALS) TABS tablet Take 1 tablet by mouth daily.    . mycophenolate (MYFORTIC) 180 MG EC tablet Take 540 mg by mouth 2 (two) times daily.     Marland Kitchen omeprazole (PRILOSEC) 20 MG capsule TAKE 1 CAPSULE BY MOUTH ONCE DAILY 90 capsule 3  . potassium chloride (K-DUR,KLOR-CON) 10 MEQ tablet Take 30 mEq by mouth daily.    . tacrolimus (PROGRAF) 0.5 MG capsule Take 0.5 mg by mouth 2 (two) times daily.     . rosuvastatin (CRESTOR) 10 MG tablet Take 1 tablet (10 mg total) by mouth daily. 90 tablet 3   No current facility-administered  medications for this visit.     Allergies  Allergen Reactions  . Morphine Itching    Family History  Problem Relation Age of Onset  . Heart disease Father   . Kidney failure Father   . Kidney disease Father   . Diabetes Maternal Grandmother   . Breast cancer Maternal Grandmother   . Liver cancer Paternal Uncle   . Liver cancer Paternal Grandmother   . Prostate cancer Neg Hx     Social History   Social History  . Marital status: Married    Spouse name: N/A  . Number of children: 2  . Years of education: N/A   Occupational History  . 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 Smoking status: Former Smoker    Types: Cigars    Quit date: 09/02/1994  . Smokeless tobacco: Never Used     Comment: cigar- once in a while  . Alcohol use 0.0 - 0.6 oz/week     Comment: occasional- 3 in the past year  . Drug use: No  . Sexual activity: Not on file   Other Topics Concern  . Not on file   Social History Narrative   In school for culinary arts--- will finish 12/16      Has living will   Wife is health care POA   Would accept resuscitation attempts   Hasn't considered tube feedings     Constitutional: Pt reports fatigue, fever. Denies headache or abrupt weight changes.  HEENT: Pt reports runny nose. Denies eye pain, eye redness, ear pain, ringing in the ears, wax buildup, nasal congestion, bloody nose, or sore throat. Respiratory: Pt reports cough. Denies difficulty breathing, shortness of breath, or sputum production.   Skin: Pt reports wound to LLE. Denies rashes, lesions  No other specific complaints in a complete review of systems (except as listed in HPI above).  Objective:   Physical Exam  BP (!) 144/80   Pulse 77   Temp 97.9 F (36.6 C) (Oral)   Wt 282 lb (127.9 kg)   SpO2 98%   BMI 39.33 kg/m  Wt Readings from Last 3 Encounters:  05/23/16 282 lb (127.9 kg)  05/10/16 289 lb (131.1 kg)  05/03/16  291 lb 12 oz (132.3 kg)    General: Appears his stated age, obese in NAD. Skin: 2 cm open ulcer, limited to breakdown of skin, noted on left anterior shin. HEENT: Head: normal shape  and size, no sinus tenderness noted; Ears: Tm's gray and intact, normal light reflex; Nose: mucosa boggy (L>R) and moist, septum midline; Throat/Mouth: Teeth present, mucosa pink and moist, + PND, no exudate, lesions or ulcerations noted.  Neck:  No adenopathy noted Cardiovascular: Normal rate with irregular rhythm.  Pulmonary/Chest: Normal effort and positive vesicular breath sounds. No respiratory distress. No wheezes, rales or ronchi noted.    BMET    Component Value Date/Time   NA 138 03/06/2016 0736   NA 141 06/16/2014 0911   K 3.5 03/06/2016 0736   K 3.4 (L) 06/16/2014 0911   CL 105 03/06/2016 0736   CL 104 06/16/2014 0911   CO2 27 03/06/2016 0736   CO2 30 06/16/2014 0911   GLUCOSE 87 03/06/2016 0736   GLUCOSE 139 (H) 06/16/2014 0911   BUN 16 03/06/2016 0736   BUN 13 06/16/2014 0911   CREATININE 0.97 03/06/2016 0736   CREATININE 0.83 06/16/2014 0911   CALCIUM 8.9 03/06/2016 0736   CALCIUM 9.1 06/16/2014 0911   GFRNONAA >60 03/06/2016 0736   GFRNONAA >60 06/16/2014 0911   GFRAA >60 03/06/2016 0736   GFRAA >60 06/16/2014 0911    Lipid Panel     Component Value Date/Time   CHOL 92 03/06/2016 0736   CHOL 161 03/07/2015 0823   TRIG 73 03/06/2016 0736   HDL 34 (L) 03/06/2016 0736   HDL 38 (L) 03/07/2015 0823   CHOLHDL 2.7 03/06/2016 0736   VLDL 15 03/06/2016 0736   LDLCALC 43 03/06/2016 0736   LDLCALC 105 (H) 03/07/2015 0823    CBC    Component Value Date/Time   WBC 5.3 10/18/2015 1153   RBC 3.90 (L) 10/18/2015 1153   HGB 11.7 (L) 10/18/2015 1153   HGB 12.9 (L) 06/16/2014 0911   HCT 33.6 (L) 10/18/2015 1153   HCT 40.1 03/07/2015 0823   PLT 165 10/18/2015 1153   PLT 148 (L) 06/16/2014 0911   MCV 86.1 10/18/2015 1153   MCV 83 06/16/2014 0911   MCH 29.9 10/18/2015 1153   MCHC  34.7 10/18/2015 1153   RDW 17.1 (H) 10/18/2015 1153   RDW 14.9 (H) 06/16/2014 0911   LYMPHSABS 0.7 (L) 10/13/2014 0350   LYMPHSABS 0.5 (L) 06/16/2014 0911   MONOABS 0.5 10/13/2014 0350   MONOABS 0.5 06/16/2014 0911   EOSABS 0.1 10/13/2014 0350   EOSABS 0.1 06/16/2014 0911   BASOSABS 0.1 10/13/2014 0350   BASOSABS 0.1 06/16/2014 0911    Hgb A1C Lab Results  Component Value Date   HGBA1C 7.2 01/17/2016            Assessment & Plan:   ER follow up for Influenza:  ER notes, labs and imaging reviewed He has finished Tamiflu  Viral Sinusitis:  Start using your Nasonex Add in Zyrtec OTC RX for Hycodan cough syrup Return precautions discussed  Diabetic Ulcer:  No s/s of cellulitis or infection Referral placed to wound clinic  RTC as needed or if symptoms persist or worsen Zamyah Wiesman, NP

## 2016-05-23 NOTE — Patient Instructions (Signed)

## 2016-05-25 DIAGNOSIS — E662 Morbid (severe) obesity with alveolar hypoventilation: Secondary | ICD-10-CM | POA: Diagnosis not present

## 2016-05-25 DIAGNOSIS — I482 Chronic atrial fibrillation: Secondary | ICD-10-CM | POA: Diagnosis not present

## 2016-05-25 DIAGNOSIS — G4733 Obstructive sleep apnea (adult) (pediatric): Secondary | ICD-10-CM | POA: Diagnosis not present

## 2016-05-27 ENCOUNTER — Other Ambulatory Visit: Payer: Self-pay | Admitting: Internal Medicine

## 2016-05-31 DIAGNOSIS — R808 Other proteinuria: Secondary | ICD-10-CM | POA: Diagnosis not present

## 2016-05-31 DIAGNOSIS — Z7901 Long term (current) use of anticoagulants: Secondary | ICD-10-CM | POA: Diagnosis not present

## 2016-05-31 DIAGNOSIS — E119 Type 2 diabetes mellitus without complications: Secondary | ICD-10-CM | POA: Diagnosis not present

## 2016-05-31 DIAGNOSIS — I2510 Atherosclerotic heart disease of native coronary artery without angina pectoris: Secondary | ICD-10-CM | POA: Diagnosis not present

## 2016-05-31 DIAGNOSIS — Z4822 Encounter for aftercare following kidney transplant: Secondary | ICD-10-CM | POA: Diagnosis not present

## 2016-05-31 DIAGNOSIS — Z885 Allergy status to narcotic agent status: Secondary | ICD-10-CM | POA: Diagnosis not present

## 2016-05-31 DIAGNOSIS — R809 Proteinuria, unspecified: Secondary | ICD-10-CM | POA: Diagnosis not present

## 2016-05-31 DIAGNOSIS — Z940 Kidney transplant status: Secondary | ICD-10-CM | POA: Diagnosis not present

## 2016-05-31 DIAGNOSIS — Z9989 Dependence on other enabling machines and devices: Secondary | ICD-10-CM | POA: Diagnosis not present

## 2016-05-31 DIAGNOSIS — E872 Acidosis: Secondary | ICD-10-CM | POA: Diagnosis not present

## 2016-05-31 DIAGNOSIS — N186 End stage renal disease: Secondary | ICD-10-CM | POA: Diagnosis not present

## 2016-05-31 DIAGNOSIS — G4733 Obstructive sleep apnea (adult) (pediatric): Secondary | ICD-10-CM | POA: Diagnosis not present

## 2016-05-31 DIAGNOSIS — D8989 Other specified disorders involving the immune mechanism, not elsewhere classified: Secondary | ICD-10-CM | POA: Diagnosis not present

## 2016-05-31 DIAGNOSIS — I120 Hypertensive chronic kidney disease with stage 5 chronic kidney disease or end stage renal disease: Secondary | ICD-10-CM | POA: Diagnosis not present

## 2016-05-31 DIAGNOSIS — I4891 Unspecified atrial fibrillation: Secondary | ICD-10-CM | POA: Diagnosis not present

## 2016-05-31 DIAGNOSIS — Z79899 Other long term (current) drug therapy: Secondary | ICD-10-CM | POA: Diagnosis not present

## 2016-05-31 DIAGNOSIS — E1122 Type 2 diabetes mellitus with diabetic chronic kidney disease: Secondary | ICD-10-CM | POA: Diagnosis not present

## 2016-05-31 DIAGNOSIS — Z794 Long term (current) use of insulin: Secondary | ICD-10-CM | POA: Diagnosis not present

## 2016-05-31 DIAGNOSIS — B259 Cytomegaloviral disease, unspecified: Secondary | ICD-10-CM | POA: Diagnosis not present

## 2016-05-31 DIAGNOSIS — Z6841 Body Mass Index (BMI) 40.0 and over, adult: Secondary | ICD-10-CM | POA: Diagnosis not present

## 2016-05-31 DIAGNOSIS — I10 Essential (primary) hypertension: Secondary | ICD-10-CM | POA: Diagnosis not present

## 2016-05-31 DIAGNOSIS — D899 Disorder involving the immune mechanism, unspecified: Secondary | ICD-10-CM | POA: Diagnosis not present

## 2016-06-03 ENCOUNTER — Encounter: Payer: 59 | Attending: Surgery | Admitting: Surgery

## 2016-06-03 DIAGNOSIS — Z951 Presence of aortocoronary bypass graft: Secondary | ICD-10-CM | POA: Diagnosis not present

## 2016-06-03 DIAGNOSIS — H269 Unspecified cataract: Secondary | ICD-10-CM | POA: Insufficient documentation

## 2016-06-03 DIAGNOSIS — I509 Heart failure, unspecified: Secondary | ICD-10-CM | POA: Insufficient documentation

## 2016-06-03 DIAGNOSIS — I2510 Atherosclerotic heart disease of native coronary artery without angina pectoris: Secondary | ICD-10-CM | POA: Insufficient documentation

## 2016-06-03 DIAGNOSIS — I87312 Chronic venous hypertension (idiopathic) with ulcer of left lower extremity: Secondary | ICD-10-CM | POA: Insufficient documentation

## 2016-06-03 DIAGNOSIS — I132 Hypertensive heart and chronic kidney disease with heart failure and with stage 5 chronic kidney disease, or end stage renal disease: Secondary | ICD-10-CM | POA: Diagnosis not present

## 2016-06-03 DIAGNOSIS — Z794 Long term (current) use of insulin: Secondary | ICD-10-CM | POA: Diagnosis not present

## 2016-06-03 DIAGNOSIS — G4730 Sleep apnea, unspecified: Secondary | ICD-10-CM | POA: Insufficient documentation

## 2016-06-03 DIAGNOSIS — Z940 Kidney transplant status: Secondary | ICD-10-CM | POA: Insufficient documentation

## 2016-06-03 DIAGNOSIS — L97222 Non-pressure chronic ulcer of left calf with fat layer exposed: Secondary | ICD-10-CM | POA: Insufficient documentation

## 2016-06-03 DIAGNOSIS — E11622 Type 2 diabetes mellitus with other skin ulcer: Secondary | ICD-10-CM | POA: Insufficient documentation

## 2016-06-03 DIAGNOSIS — K219 Gastro-esophageal reflux disease without esophagitis: Secondary | ICD-10-CM | POA: Insufficient documentation

## 2016-06-03 DIAGNOSIS — Z87891 Personal history of nicotine dependence: Secondary | ICD-10-CM | POA: Diagnosis not present

## 2016-06-03 DIAGNOSIS — J45909 Unspecified asthma, uncomplicated: Secondary | ICD-10-CM | POA: Insufficient documentation

## 2016-06-03 DIAGNOSIS — E1122 Type 2 diabetes mellitus with diabetic chronic kidney disease: Secondary | ICD-10-CM | POA: Insufficient documentation

## 2016-06-03 DIAGNOSIS — M109 Gout, unspecified: Secondary | ICD-10-CM | POA: Insufficient documentation

## 2016-06-03 DIAGNOSIS — Z7901 Long term (current) use of anticoagulants: Secondary | ICD-10-CM | POA: Insufficient documentation

## 2016-06-03 DIAGNOSIS — Z79899 Other long term (current) drug therapy: Secondary | ICD-10-CM | POA: Diagnosis not present

## 2016-06-03 DIAGNOSIS — I890 Lymphedema, not elsewhere classified: Secondary | ICD-10-CM | POA: Diagnosis not present

## 2016-06-03 DIAGNOSIS — I4891 Unspecified atrial fibrillation: Secondary | ICD-10-CM | POA: Insufficient documentation

## 2016-06-03 DIAGNOSIS — N186 End stage renal disease: Secondary | ICD-10-CM | POA: Diagnosis not present

## 2016-06-03 DIAGNOSIS — L97822 Non-pressure chronic ulcer of other part of left lower leg with fat layer exposed: Secondary | ICD-10-CM | POA: Diagnosis not present

## 2016-06-03 DIAGNOSIS — Z885 Allergy status to narcotic agent status: Secondary | ICD-10-CM | POA: Insufficient documentation

## 2016-06-03 NOTE — Progress Notes (Signed)
[name], [name] ([id number]) Visit Report for 06/03/2016 Allergy List Details Patient Name: [name], [name]. Date of Service: 06/03/2016 9:45 AM Medical Record Number: [id number] Patient Account Number: [id number] Date of Birth/Sex: 04-22-1960 (56 y.o. Male) Treating RN: Ahmed Prima Primary Care Skylyn Slezak: [name] Other Clinician: Referring Starlina Lapre: [name] Treating Merrie Epler/Extender: Frann Rider in Treatment: 0 Allergies Active Allergies morphine Allergy Notes Electronic Signature(s) Signed: 06/03/2016 12:02:17 PM By: Alric Quan Entered By: Alric Quan on 06/03/2016 09:49:37 [name] ([id number]) -------------------------------------------------------------------------------- Arrival Information Details Patient Name: [name]. Date of Service: 06/03/2016 9:45 AM Medical Record Number: [id number] Patient Account Number: [id number] Date of Birth/Sex: [birth year] (56 y.o. Male) Treating RN: Ahmed Prima Primary Care Cordel Drewes: [name] Other Clinician: Referring Biviana Saddler: [name] Treating Derril Franek/Extender: Frann Rider in Treatment: 0 Visit Information Patient Arrived: Ambulatory Arrival Time: 09:42 Accompanied By: self Transfer Assistance: None Patient Identification Verified: Yes Secondary Verification Process Yes Completed: Patient Requires Transmission-Based No Precautions: Patient Has Alerts: Yes Patient Alerts: DM II Eliquis Electronic Signature(s) Signed: 06/03/2016 12:02:17 PM By: Alric Quan Entered By: Alric Quan on 06/03/2016 09:43:50 [name] ([id number]) -------------------------------------------------------------------------------- Clinic Level of Care Assessment Details Patient Name: [name]. Date of Service: 06/03/2016 9:45 AM Medical Record Number: [id number] Patient Account Number: [id number] Date of Birth/Sex: [birth year] (56 y.o. Male) Treating RN:  Ahmed Prima Primary Care Jayzon Taras: [name] Other Clinician: Referring Nikolas Casher: [name] Treating Rukiya Hodgkins/Extender: Frann Rider in Treatment: 0 Clinic Level of Care Assessment Items TOOL 1 Quantity Score X - Use when EandM and Procedure is performed on INITIAL visit 1 0 ASSESSMENTS - Nursing Assessment / Reassessment X - General Physical Exam (combine w/ comprehensive assessment (listed just 1 20 below) when performed on new pt. evals) X - Comprehensive Assessment (HX, ROS, Risk Assessments, Wounds Hx, etc.) 1 25 ASSESSMENTS - Wound and Skin Assessment / Reassessment _0  - Dermatologic / Skin Assessment (not related to wound area) 0 ASSESSMENTS - Ostomy and/or Continence Assessment and Care _1  - Incontinence Assessment and Management 0 _2  - Ostomy Care Assessment and Management (repouching, etc.) 0 PROCESS - Coordination of Care _3  - Simple Patient / Family Education for ongoing care 0 X - Complex (extensive) Patient / Family Education for ongoing care 1 20 X - Staff obtains Programmer, systems, Records, Test Results / Process Orders 1 10 _4  - Staff telephones HHA, Nursing Homes / Clarify orders / etc 0 _5  - Routine Transfer to another Facility (non-emergent condition) 0 _6  - Routine Hospital Admission (non-emergent condition) 0 X - New Admissions / Biomedical engineer / Ordering NPWT, Apligraf, etc. 1 15 _7  - Emergency Hospital Admission (emergent condition) 0 PROCESS - Special Needs _8  - Pediatric / Minor Patient Management 0 _9  - Isolation Patient Management 0 Duecker, Caydence F. ([id number]) _10  - Hearing / Language / Visual special needs 0 _11  - Assessment of Community assistance (transportation, D/C planning, etc.) 0 _12  - Additional assistance / Altered mentation 0 _13  - Support Surface(s) Assessment (bed, cushion, seat, etc.) 0 INTERVENTIONS - Miscellaneous _14  - External ear exam 0 _15  - Patient Transfer (multiple staff / Civil Service fast streamer / Similar devices) 0 _16  -  Simple Staple / Suture removal (25 or less) 0 _17  - Complex Staple / Suture removal (26 or more) 0 _18  - Hypo/Hyperglycemic Management (do not check if billed separately) 0 X - Ankle / Brachial Index (ABI) - do not check if billed separately 1 15 Has the patient been seen at the hospital within the last  three years: Yes Total Score: 105 Level Of Care: New/Established - Level 3 Electronic Signature(s) Signed: 06/03/2016 12:02:17 PM By: Alric Quan Entered By: Alric Quan on 06/03/2016 11:53:12 [name] ([id number]) -------------------------------------------------------------------------------- Encounter Discharge Information Details Patient Name: [name]. Date of Service: 06/03/2016 9:45 AM Medical Record Number: [id number] Patient Account Number: [id number] Date of Birth/Sex: 02-10-1961 (56 y.o. Male) Treating RN: Ahmed Prima Primary Care Tabby Beaston: [name] Other Clinician: Referring Carey Lafon: [name] Treating Kamera Dubas/Extender: Frann Rider in Treatment: 0 Encounter Discharge Information Items Discharge Pain Level: 0 Discharge Condition: Stable Ambulatory Status: Ambulatory Discharge Destination: Home Transportation: Private Auto Accompanied By: self Schedule Follow-up Appointment: Yes Medication Reconciliation completed and provided to Patient/Care No Kenyanna Grzesiak: Provided on Clinical Summary of Care: 06/03/2016 Form Type Recipient Paper Patient JG Electronic Signature(s) Signed: 06/03/2016 10:53:56 AM By: Ruthine Dose Entered By: Ruthine Dose on 06/03/2016 10:53:56 Martinek, [name] ([id number]) -------------------------------------------------------------------------------- Lower Extremity Assessment Details Patient Name: Carl Speller F. Date of Service: 06/03/2016 9:45 AM Medical Record Number: [id number] Patient Account Number: [id number] Date of Birth/Sex: 07/05/1960 (56 y.o. Male) Treating RN: Carolyne Fiscal, Debi Primary  Care Meleana Commerford: [name] Other Clinician: Referring Haily Caley: [name] Treating Jaxsin Bottomley/Extender: Frann Rider in Treatment: 0 Edema Assessment Assessed: Shirlyn Goltz: No] Patrice Paradise: No] Edema: [Left: Ye] [Right: s] Calf Left: Right: Point of Measurement: 32 cm From Medial Instep 42.2 cm cm Ankle Left: Right: Point of Measurement: 11 cm From Medial Instep 24.6 cm cm Vascular Assessment Pulses: Dorsalis Pedis Palpable: [Left:Yes] Doppler Audible: [Left:Yes] Posterior Tibial Extremity colors, hair growth, and conditions: Extremity Color: [Left:Hyperpigmented] Hair Growth on Extremity: [Left:Yes] Temperature of Extremity: [Left:Warm] Capillary Refill: [Left:< 3 seconds] Toe Nail Assessment Left: Right: Thick: Yes Discolored: No Deformed: No Improper Length and Hygiene: No Notes L ABI noncompressible Electronic Signature(s) Signed: 06/03/2016 12:02:17 PM By: Rocky Crafts, [name] ([id number]) Entered By: Alric Quan on 06/03/2016 10:06:40 [name] ([id number]) -------------------------------------------------------------------------------- Multi Wound Chart Details Patient Name: Carl Speller F. Date of Service: 06/03/2016 9:45 AM Medical Record Number: [id number] Patient Account Number: [id number] Date of Birth/Sex: April 09, 1960 (56 y.o. Male) Treating RN: Ahmed Prima Primary Care Ulyana Pitones: [name] Other Clinician: Referring Randee Huston: [name] Treating Amilio Zehnder/Extender: Frann Rider in Treatment: 0 Vital Signs Height(in): 70 Pulse(bpm): 62 Weight(lbs): 179 Blood Pressure 158/78 (mmHg): Body Mass Index(BMI): 26 Temperature(F): 97.7 Respiratory Rate 20 (breaths/min): Photos: [1:No Photos] [2:No Photos] [N/A:N/A] Wound Location: [1:Left Lower Leg - Anterior] [2:Left Lower Leg - Medial] [N/A:N/A] Wounding Event: [1:Trauma] [2:Blister] [N/A:N/A] Primary Etiology: [1:Diabetic Wound/Ulcer of the Lower  Extremity] [2:Diabetic Wound/Ulcer of the Lower Extremity] [N/A:N/A] Comorbid History: [1:Cataracts, Asthma, Sleep Apnea, Congestive Heart Failure, Coronary Artery Disease, Hypertension, Type II Diabetes, End Stage Renal Disease, Gout] [2:Cataracts, Asthma, Sleep Apnea, Congestive Heart Failure, Coronary Artery Disease,  Hypertension, Type II Diabetes, End Stage Renal Disease, Gout] [N/A:N/A] Date Acquired: [1:10/11/2015] [2:05/20/2016] [N/A:N/A] Weeks of Treatment: [1:0] [2:0] [N/A:N/A] Wound Status: [1:Open] [2:Open] [N/A:N/A] Measurements L x W x D 3x1.6x0.1 [2:2.7x3x0.1] [N/A:N/A] (cm) Area (cm) : [1:3.77] [2:6.362] [N/A:N/A] Volume (cm) : [1:0.377] [2:0.636] [N/A:N/A] Classification: [1:Grade 2] [2:Grade 2] [N/A:N/A] Exudate Amount: [1:None Present] [2:Large] [N/A:N/A] Exudate Type: [1:N/A] [2:Serosanguineous] [N/A:N/A] Exudate Color: [1:N/A] [2:red, brown] [N/A:N/A] Wound Margin: [1:Distinct, outline attached] [2:Distinct, outline attached] [N/A:N/A] Granulation Amount: [1:None Present (0%)] [2:None Present (0%)] [N/A:N/A] Necrotic Amount: [1:Large (67-100%)] [2:Large (67-100%)] [N/A:N/A] Necrotic Tissue: [1:Eschar] [2:Eschar, Adherent Slough] [N/A:N/A] Exposed Structures: [1:Fascia: No Fat Layer (Subcutaneous Tissue) Exposed: No] [2:Fascia: No Fat Layer (Subcutaneous Tissue) Exposed: No] [N/A:N/A]  Tendon: No Tendon: No Muscle: No Muscle: No Joint: No Joint: No Bone: No Bone: No Limited to Skin Limited to Skin Breakdown Breakdown Epithelialization: None None N/A Debridement: Debridement (51884- Debridement (16606- N/A 11047) 11047) Pre-procedure 10:39 10:39 N/A Verification/Time Out Taken: Pain Control: Lidocaine 4% Topical Lidocaine 4% Topical N/A Solution Solution Tissue Debrided: Fibrin/Slough, Exudates, Fibrin/Slough, Exudates, N/A Subcutaneous Subcutaneous Level: Skin/Subcutaneous Skin/Subcutaneous N/A Tissue Tissue Debridement Area (sq 4.8 8.1  N/A cm): Instrument: Curette Curette N/A Bleeding: Minimum Minimum N/A Hemostasis Achieved: Pressure Pressure N/A Procedural Pain: 0 0 N/A Post Procedural Pain: 0 0 N/A Debridement Treatment Procedure was tolerated Procedure was tolerated N/A Response: well well Post Debridement 1.5x0.8x0.2 2.7x3x0.1 N/A Measurements L x W x D (cm) Post Debridement 0.188 0.636 N/A Volume: (cm) Periwound Skin Texture: No Abnormalities Noted No Abnormalities Noted N/A Periwound Skin Dry/Scaly: Yes Dry/Scaly: Yes N/A Moisture: Periwound Skin Color: No Abnormalities Noted No Abnormalities Noted N/A Temperature: No Abnormality No Abnormality N/A Tenderness on Yes Yes N/A Palpation: Wound Preparation: Ulcer Cleansing: Ulcer Cleansing: N/A Rinsed/Irrigated with Rinsed/Irrigated with Saline Saline Topical Anesthetic Topical Anesthetic Applied: Other: lidocaine Applied: Other: lidocaine 4% 4% Procedures Performed: Debridement Debridement N/A Treatment Notes [name], [name] ([id number]) Wound #1 (Left, Anterior Lower Leg) 1. Cleansed with: Clean wound with Normal Saline 2. Anesthetic Topical Lidocaine 4% cream to wound bed prior to debridement 4. Dressing Applied: Aquacel Ag 5. Secondary Dressing Applied ABD Pad Kerlix/Conform 7. Secured with Tape Wound #2 (Left, Medial Lower Leg) 1. Cleansed with: Clean wound with Normal Saline 2. Anesthetic Topical Lidocaine 4% cream to wound bed prior to debridement 4. Dressing Applied: Aquacel Ag 5. Secondary Dressing Applied ABD Pad Kerlix/Conform 7. Secured with Recruitment consultant) Signed: 06/03/2016 11:35:07 AM By: Christin Fudge MD, FACS Entered By: Christin Fudge on 06/03/2016 11:35:07 KHA, HARI ([id number]) -------------------------------------------------------------------------------- [place] Details Patient Name: [name], ENKE. Date of Service: 06/03/2016 9:45 AM Medical Record Number:  [id number] Patient Account Number: [id number] Date of Birth/Sex: [birth year] (56 y.o. Male) Treating RN: Ahmed Prima Primary Care Shubh Chiara: [name] Other Clinician: Referring Sajad Glander: [name] Treating Emberley Kral/Extender: Frann Rider in Treatment: 0 Active Inactive ` Nutrition Nursing Diagnoses: Imbalanced nutrition Impaired glucose control: actual or potential Goals: Patient/caregiver agrees to and verbalizes understanding of need to use nutritional supplements and/or vitamins as prescribed Date Initiated: 06/03/2016 Target Resolution Date: 09/28/2016 Goal Status: Active Interventions: Assess patient nutrition upon admission and as needed per policy Notes: ` Orientation to the Wound Care Program Nursing Diagnoses: Knowledge deficit related to the wound healing center program Goals: Patient/caregiver will verbalize understanding of the [place] Date Initiated: 06/03/2016 Target Resolution Date: 06/29/2016 Goal Status: Active Interventions: Provide education on orientation to the wound center Notes: ` Pain, Acute or Chronic Nursing Diagnoses: MILFRED, KRAMMES ([id number]) Pain, acute or chronic: actual or potential Potential alteration in comfort, pain Goals: Patient/caregiver will verbalize adequate pain control between visits Date Initiated: 06/03/2016 Target Resolution Date: 08/31/2016 Goal Status: Active Interventions: Assess comfort goal upon admission Complete pain assessment as per visit requirements Notes: ` Wound/Skin Impairment Nursing Diagnoses: Impaired tissue integrity Knowledge deficit related to ulceration/compromised skin integrity Goals: Ulcer/skin breakdown will have a volume reduction of 80% by week 12 Date Initiated: 06/03/2016 Target Resolution Date: 09/21/2016 Goal Status: Active Interventions: Assess patient/caregiver ability to perform ulcer/skin care regimen upon admission and as needed Assess  ulceration(s) every visit Notes: Electronic Signature(s) Signed: 06/03/2016 12:02:17 PM By: Alric Quan Entered By: Alric Quan on 06/03/2016  10:17:52 RISHAN, OYAMA ([id number]) -------------------------------------------------------------------------------- Pain Assessment Details Patient Name: [name], KESSEN. Date of Service: 06/03/2016 9:45 AM Medical Record Number: [id number] Patient Account Number: [id number] Date of Birth/Sex: [birth year] (56 y.o. Male) Treating RN: Ahmed Prima Primary Care Madai Nuccio: [name] Other Clinician: Referring Dazia Lippold: [name] Treating Leodan Bolyard/Extender: Frann Rider in Treatment: 0 Active Problems Location of Pain Severity and Description of Pain Patient Has Paino No Site Locations With Dressing Change: No Pain Management and Medication Current Pain Management: Electronic Signature(s) Signed: 06/03/2016 12:02:17 PM By: Alric Quan Entered By: Alric Quan on 06/03/2016 09:43:59 [name], [name] ([id number]) -------------------------------------------------------------------------------- Patient/Caregiver Education Details Patient Name: [name]. Date of Service: 06/03/2016 9:45 AM Medical Record Number: [id number] Patient Account Number: [id number] Date of Birth/Gender: September 14, 1960 (56 y.o. Male) Treating RN: Ahmed Prima Primary Care Physician: [name] Other Clinician: Referring Physician: [name] Treating Physician/Extender: Frann Rider in Treatment: 0 Education Assessment Education Provided To: Patient Education Topics Provided Welcome To [facility]: Handouts: Welcome To [facility] Methods: Explain/Verbal Responses: State content correctly Wound/Skin Impairment: Handouts: Other: change dressing as ordered Methods: Demonstration, Explain/Verbal Responses: State content correctly Electronic Signature(s) Signed: 06/03/2016 12:02:17 PM By:  Alric Quan Entered By: Alric Quan on 06/03/2016 10:18:40 [name] ([id number]) -------------------------------------------------------------------------------- Wound Assessment Details Patient Name: Carl Speller F. Date of Service: 06/03/2016 9:45 AM Medical Record Number: [id number] Patient Account Number: [id number] Date of Birth/Sex: October 27, 1960 (56 y.o. Male) Treating RN: Ahmed Prima Primary Care Sire Poet: [name] Other Clinician: Referring Eshaal Duby: [name] Treating Acadia Thammavong/Extender: Frann Rider in Treatment: 0 Wound Status Wound Number: 1 Primary Diabetic Wound/Ulcer of the Lower Etiology: Extremity Wound Location: Left Lower Leg - Anterior Wound Open Wounding Event: Trauma Status: Date Acquired: 10/11/2015 Comorbid Cataracts, Asthma, Sleep Apnea, Weeks Of Treatment: 0 History: Congestive Heart Failure, Coronary Clustered Wound: No Artery Disease, Hypertension, Type II Diabetes, End Stage Renal Disease, Gout Photos Photo Uploaded By: Alric Quan on 06/03/2016 11:56:59 Wound Measurements Length: (cm) 3 Width: (cm) 1.6 Depth: (cm) 0.1 Area: (cm) 3.77 Volume: (cm) 0.377 % Reduction in Area: % Reduction in Volume: Epithelialization: None Tunneling: No Undermining: No Wound Description Classification: Grade 2 Foul Odor Afte Wound Margin: Distinct, outline attached Slough/Fibrino Exudate Amount: None Present r Cleansing: No No Wound Bed Granulation Amount: None Present (0%) Exposed Structure Necrotic Amount: Large (67-100%) Fascia Exposed: No Necrotic Quality: Eschar Fat Layer (Subcutaneous Tissue) Exposed: No Tendon Exposed: No Rudell, Ross F. ([id number]) Muscle Exposed: No Joint Exposed: No Bone Exposed: No Limited to Skin Breakdown Periwound Skin Texture Texture Color No Abnormalities Noted: No No Abnormalities Noted: No Moisture Temperature / Pain No Abnormalities Noted: No Temperature: No  Abnormality Dry / Scaly: Yes Tenderness on Palpation: Yes Wound Preparation Ulcer Cleansing: Rinsed/Irrigated with Saline Topical Anesthetic Applied: Other: lidocaine 4%, Treatment Notes Wound #1 (Left, Anterior Lower Leg) 1. Cleansed with: Clean wound with Normal Saline 2. Anesthetic Topical Lidocaine 4% cream to wound bed prior to debridement 4. Dressing Applied: Aquacel Ag 5. Secondary Dressing Applied ABD Pad Kerlix/Conform 7. Secured with Recruitment consultant) Signed: 06/03/2016 12:02:17 PM By: Alric Quan Entered By: Alric Quan on 06/03/2016 10:09:08 [name] ([id number]) -------------------------------------------------------------------------------- Wound Assessment Details Patient Name: [name], CHEVEZ. Date of Service: 06/03/2016 9:45 AM Medical Record Number: [id number] Patient Account Number: [id number] Date of Birth/Sex: 1961-01-19 (56 y.o. Male) Treating RN: Ahmed Prima Primary Care Tommy Goostree: [name] Other Clinician: Referring Levante Simones: [name] Treating Keiron Iodice/Extender: Frann Rider in  Treatment: 0 Wound Status Wound Number: 2 Primary Diabetic Wound/Ulcer of the Lower Etiology: Extremity Wound Location: Left Lower Leg - Medial Wound Open Wounding Event: Blister Status: Date Acquired: 05/20/2016 Comorbid Cataracts, Asthma, Sleep Apnea, Weeks Of Treatment: 0 History: Congestive Heart Failure, Coronary Clustered Wound: No Artery Disease, Hypertension, Type II Diabetes, End Stage Renal Disease, Gout Photos Photo Uploaded By: Alric Quan on 06/03/2016 11:56:59 Wound Measurements Length: (cm) 2.7 Width: (cm) 3 Depth: (cm) 0.1 Area: (cm) 6.362 Volume: (cm) 0.636 % Reduction in Area: % Reduction in Volume: Epithelialization: None Tunneling: No Undermining: No Wound Description Classification: Grade 2 Foul Odor Afte Wound Margin: Distinct, outline attached Slough/Fibrino Exudate Amount:  Large Exudate Type: Serosanguineous Exudate Color: red, brown r Cleansing: No Yes Wound Bed Granulation Amount: None Present (0%) Exposed Structure Necrotic Amount: Large (67-100%) Fascia Exposed: No [name], [name] ([id number]) Necrotic Quality: Eschar, Adherent Slough Fat Layer (Subcutaneous Tissue) Exposed: No Tendon Exposed: No Muscle Exposed: No Joint Exposed: No Bone Exposed: No Limited to Skin Breakdown Periwound Skin Texture Texture Color No Abnormalities Noted: No No Abnormalities Noted: No Moisture Temperature / Pain No Abnormalities Noted: No Temperature: No Abnormality Dry / Scaly: Yes Tenderness on Palpation: Yes Wound Preparation Ulcer Cleansing: Rinsed/Irrigated with Saline Topical Anesthetic Applied: Other: lidocaine 4%, Treatment Notes Wound #2 (Left, Medial Lower Leg) 1. Cleansed with: Clean wound with Normal Saline 2. Anesthetic Topical Lidocaine 4% cream to wound bed prior to debridement 4. Dressing Applied: Aquacel Ag 5. Secondary Dressing Applied ABD Pad Kerlix/Conform 7. Secured with Recruitment consultant) Signed: 06/03/2016 12:02:17 PM By: Alric Quan Entered By: Alric Quan on 06/03/2016 10:11:05 [name] ([id number]) -------------------------------------------------------------------------------- [place] Details Patient Name: [name]. Date of Service: 06/03/2016 9:45 AM Medical Record Number: [id number] Patient Account Number: [id number] Date of Birth/Sex: 09/22/1960 (56 y.o. Male) Treating RN: Ahmed Prima Primary Care Nicky Kras: [name] Other Clinician: Referring Ahnna Dungan: [name] Treating Dacen Frayre/Extender: Frann Rider in Treatment: 0 Vital Signs Time Taken: 09:44 Temperature (F): 97.7 Height (in): 70 Pulse (bpm): 62 Source: Stated Respiratory Rate (breaths/min): 20 Weight (lbs): 179 Blood Pressure (mmHg): 158/78 Source: Measured Reference Range: 80 - 120 mg /  dl Body Mass Index (BMI): 25.7 Electronic Signature(s) Signed: 06/03/2016 12:02:17 PM By: Alric Quan Entered By: Alric Quan on 06/03/2016 09:45:54

## 2016-06-03 NOTE — Progress Notes (Signed)
GER, RINGENBERG ([id number]) Visit Report for 06/03/2016 Chief Complaint Document Details Patient Name: [name], [name]. Date of Service: 06/03/2016 9:45 AM Medical Record Number: [id number] Patient Account Number: [id number] Date of Birth/Sex: [birth year] (56 y.o. Male) Treating RN: Ahmed Prima Primary Care Provider: [name] Other Clinician: Referring Provider: [name] Treating Provider/Extender: Frann Rider in Treatment: 0 Information Obtained from: Patient Chief Complaint Patient presents for treatment of an open ulcer due to venous insufficiency to the left lower extremity one on the anterior shin which has been there for about 8 months and one on the lower medial calf which has been there for about 2 weeks Electronic Signature(s) Signed: 06/03/2016 11:36:18 AM By: Christin Fudge MD, FACS Entered By: Christin Fudge on 06/03/2016 11:36:18 [name] ([id number]) -------------------------------------------------------------------------------- Debridement Details Patient Name: [name]. Date of Service: 06/03/2016 9:45 AM Medical Record Number: [id number] Patient Account Number: [id number] Date of Birth/Sex: 1961-02-17 (56 y.o. Male) Treating RN: Ahmed Prima Primary Care Provider: [name] Other Clinician: Referring Provider: [name] Treating Provider/Extender: Frann Rider in Treatment: 0 Debridement Performed for Wound #1 Left,Anterior Lower Leg Assessment: Performed By: Physician Christin Fudge, MD Debridement: Debridement Pre-procedure Yes - 10:39 Verification/Time Out Taken: Start Time: 10:42 Pain Control: Lidocaine 4% Topical Solution Level: Skin/Subcutaneous Tissue Total Area Debrided (L x 3 (cm) x 1.6 (cm) = 4.8 (cm) W): Tissue and other Viable, Non-Viable, Exudate, Fibrin/Slough, Subcutaneous material debrided: Instrument: Curette Bleeding: Minimum Hemostasis Achieved: Pressure End Time:  10:44 Procedural Pain: 0 Post Procedural Pain: 0 Response to Treatment: Procedure was tolerated well Post Debridement Measurements of Total Wound Length: (cm) 1.5 Width: (cm) 0.8 Depth: (cm) 0.2 Volume: (cm) 0.188 Character of Wound/Ulcer Post Requires Further Debridement Debridement: Severity of Tissue Post Debridement: Fat layer exposed Post Procedure Diagnosis Same as Pre-procedure Electronic Signature(s) Signed: 06/03/2016 11:35:33 AM By: Christin Fudge MD, FACS Signed: 06/03/2016 12:02:17 PM By: Alric Quan Entered By: Christin Fudge on 06/03/2016 11:35:33 COLETON, WOON ([id number] Braniff, Dorothea Glassman ([id number]) -------------------------------------------------------------------------------- Debridement Details Patient Name: [name]. Date of Service: 06/03/2016 9:45 AM Medical Record Number: [id number] Patient Account Number: [id number] Date of Birth/Sex: Apr 16, 1960 (56 y.o. Male) Treating RN: Ahmed Prima Primary Care Provider: [name] Other Clinician: Referring Provider: [name] Treating Provider/Extender: Frann Rider in Treatment: 0 Debridement Performed for Wound #2 Left,Medial Lower Leg Assessment: Performed By: Physician Christin Fudge, MD Debridement: Debridement Pre-procedure Yes - 10:39 Verification/Time Out Taken: Start Time: 10:40 Pain Control: Lidocaine 4% Topical Solution Level: Skin/Subcutaneous Tissue Total Area Debrided (L x 2.7 (cm) x 3 (cm) = 8.1 (cm) W): Tissue and other Viable, Non-Viable, Exudate, Fibrin/Slough, Subcutaneous material debrided: Instrument: Curette Bleeding: Minimum Hemostasis Achieved: Pressure End Time: 10:41 Procedural Pain: 0 Post Procedural Pain: 0 Response to Treatment: Procedure was tolerated well Post Debridement Measurements of Total Wound Length: (cm) 2.7 Width: (cm) 3 Depth: (cm) 0.1 Volume: (cm) 0.636 Character of Wound/Ulcer Post Requires Further  Debridement Debridement: Severity of Tissue Post Debridement: Fat layer exposed Post Procedure Diagnosis Same as Pre-procedure Electronic Signature(s) Signed: 06/03/2016 11:35:42 AM By: Christin Fudge MD, FACS Signed: 06/03/2016 12:02:17 PM By: Alric Quan Entered By: Christin Fudge on 06/03/2016 11:35:42 MILFERD, ANSELL ([id number] Dunsworth, Dorothea Glassman ([id number]) -------------------------------------------------------------------------------- HPI Details Patient Name: [name]. Date of Service: 06/03/2016 9:45 AM Medical Record Number: [id number] Patient Account Number: [id number] Date of Birth/Sex: Jul 01, 1960 (56 y.o. Male) Treating RN: Ahmed Prima Primary Care Provider: [name] Other Clinician: Referring Provider: [name] Treating Provider/Extender:  Sylvie Mifsud Weeks in Treatment: 0 History of Present Illness Location: left anterior shin and left medial calf Quality: Patient reports experiencing a dull pain to affected area(s). Severity: Patient states wound are getting worse. Duration: Patient has had the wound for > 3 months prior to seeking treatment at the wound center Timing: Pain in wound is Intermittent (comes and goes Context: The wound appeared gradually over time Modifying Factors: Other treatment(s) tried include:had surgery on the left anterior shin for a hematoma which has been nonhealing for the last 8 months Associated Signs and Symptoms: Patient reports having increase swelling. HPI Description: 56 year old diabetic was last hemoglobin A1c was 7.2% has been reviewed today with 2 ulcerated areas in his left lower extremity which she's had for about 8 months and the one most recently was started as a blister for 2 weeks. he has a past medical history of atrial fibrillation, coronary artery disease, CHF, chronic venous stasis dermatitis, diabetes mellitus, hypertension, sleep apnea and is status post kidney transplant in 2012. He is a  former smoker and quit cigars in 1996. In the remote past he's had some arterial and venous duplex studies done but does not recall the results no was ever treated for venous insufficiency. Electronic Signature(s) Signed: 06/03/2016 11:41:18 AM By: Christin Fudge MD, FACS Entered By: Christin Fudge on 06/03/2016 11:41:18 [name] ([id number]) -------------------------------------------------------------------------------- Physical Exam Details Patient Name: ALANDO, COLLERAN. Date of Service: 06/03/2016 9:45 AM Medical Record Number: [id number] Patient Account Number: [id number] Date of Birth/Sex: [birth year] (56 y.o. Male) Treating RN: Ahmed Prima Primary Care Provider: [name] Other Clinician: Referring Provider: [name] Treating Provider/Extender: Frann Rider in Treatment: 0 Constitutional . Pulse regular. Respirations normal and unlabored. Afebrile. . Eyes Nonicteric. Reactive to light. Ears, Nose, Mouth, and Throat Lips, teeth, and gums WNL.Marland Kitchen Moist mucosa without lesions. Neck supple and nontender. No palpable supraclavicular or cervical adenopathy. Normal sized without goiter. Respiratory WNL. No retractions.. Breath sounds WNL, No rubs, rales, rhonchi, or wheeze.. Cardiovascular Heart rhythm and rate regular, no murmur or gallop.. Pedal Pulses WNL. the ABI on the left was noncompressible. he has stage I lymphedema bilaterally and on the left lower extremity he has stigmata of venous disease. Chest Breasts symmetical and no nipple discharge.. Breast tissue WNL, no masses, lumps, or tenderness.. Gastrointestinal (GI) Abdomen without masses or tenderness.. No liver or spleen enlargement or tenderness.. Lymphatic No adneopathy. No adenopathy. No adenopathy. Musculoskeletal Adexa without tenderness or enlargement.. Digits and nails w/o clubbing, cyanosis, infection, petechiae, ischemia, or inflammatory conditions.. Integumentary (Hair, Skin) No  suspicious lesions. No crepitus or fluctuance. No peri-wound warmth or erythema. No masses.Marland Kitchen Psychiatric Judgement and insight Intact.. No evidence of depression, anxiety, or agitation.. Notes the patient has significant stigmata of venous disease in the left lower extremity with some evidence of lymphedema. The hardened eschar on the anterior shin was gently removed with a #3 curet and below this there is good healing with some area of subcutaneous debris and this was measured. On the lower medial calf he has a significant amount of debris with eschar and this was sharply removed to reveal a superficial ulceration. Electronic Signature(s) SHINE, MIKES ([id number]) Signed: 06/03/2016 11:42:36 AM By: Christin Fudge MD, FACS Entered By: Christin Fudge on 06/03/2016 11:42:35 COLBIN, JOVEL ([id number]) -------------------------------------------------------------------------------- Physician Orders Details Patient Name: HARRINGTON, JOBE. Date of Service: 06/03/2016 9:45 AM Medical Record Number: [id number] Patient Account Number: [id number] Date of Birth/Sex: 1960-09-16 (56 y.o. Male) Treating RN: Carolyne Fiscal, Debi Primary  Care Provider: [name] Other Clinician: Referring Provider: [name] Treating Provider/Extender: Frann Rider in Treatment: 0 Verbal / Phone Orders: Yes Clinician: Carolyne Fiscal, Debi Read Back and Verified: Yes Diagnosis Coding Wound Cleansing Wound #1 Left,Anterior Lower Leg o Clean wound with Normal Saline. o Cleanse wound with mild soap and water o May Shower, gently pat wound dry prior to applying new dressing. Wound #2 Left,Medial Lower Leg o Clean wound with Normal Saline. o Cleanse wound with mild soap and water o May Shower, gently pat wound dry prior to applying new dressing. Anesthetic Wound #1 Left,Anterior Lower Leg o Topical Lidocaine 4% cream applied to wound bed prior to debridement Wound #2 Left,Medial Lower  Leg o Topical Lidocaine 4% cream applied to wound bed prior to debridement Primary Wound Dressing Wound #1 Left,Anterior Lower Leg o Aquacel Ag Wound #2 Left,Medial Lower Leg o Aquacel Ag Secondary Dressing Wound #1 Left,Anterior Lower Leg o ABD and Kerlix/Conform Wound #2 Left,Medial Lower Leg o ABD and Kerlix/Conform Dressing Change Frequency Wound #1 Left,Anterior Lower Leg o Change dressing every day. LINDEL, MARCELL F. ([id number]) Wound #2 Left,Medial Lower Leg o Change dressing every day. Follow-up Appointments Wound #1 Left,Anterior Lower Leg o Return Appointment in 1 week. Wound #2 Left,Medial Lower Leg o Return Appointment in 1 week. Edema Control Wound #1 Left,Anterior Lower Leg o Elevate legs to the level of the heart and pump ankles as often as possible o Other: - pt to wear own compression stockings Wound #2 Left,Medial Lower Leg o Elevate legs to the level of the heart and pump ankles as often as possible o Other: - pt to wear own compression stockings Additional Orders / Instructions Wound #1 Left,Anterior Lower Leg o Increase protein intake. Wound #2 Left,Medial Lower Leg o Increase protein intake. Services and Therapies o Arterial Studies- Bilateral o Venous Studies -Bilateral Electronic Signature(s) Signed: 06/03/2016 12:02:17 PM By: Alric Quan Signed: 06/03/2016 2:21:07 PM By: Christin Fudge MD, FACS Entered By: Alric Quan on 06/03/2016 10:45:58 Lanahan, Dorothea Glassman ([id number]) -------------------------------------------------------------------------------- Problem List Details Patient Name: DAQUAVION, CATALA. Date of Service: 06/03/2016 9:45 AM Medical Record Number: [id number] Patient Account Number: [id number] Date of Birth/Sex: 11-20-1960 (56 y.o. Male) Treating RN: Ahmed Prima Primary Care Provider: [name] Other Clinician: Referring Provider: [name] Treating Provider/Extender: Frann Rider in Treatment: 0 Active Problems ICD-10 Encounter Code Description Active Date Diagnosis E11.622 Type 2 diabetes mellitus with other skin ulcer 06/03/2016 Yes I89.0 Lymphedema, not elsewhere classified 06/03/2016 Yes L97.222 Non-pressure chronic ulcer of left calf with fat layer 06/03/2016 Yes exposed I87.312 Chronic venous hypertension (idiopathic) with ulcer of left 06/03/2016 Yes lower extremity Z94.0 Kidney transplant status 06/03/2016 Yes Inactive Problems Resolved Problems Electronic Signature(s) Signed: 06/03/2016 11:36:39 AM By: Christin Fudge MD, FACS Previous Signature: 06/03/2016 11:34:57 AM Version By: Christin Fudge MD, FACS Entered By: Christin Fudge on 06/03/2016 11:36:38 [name] ([id number]) -------------------------------------------------------------------------------- Progress Note Details Patient Name: [name] Date of Service: 06/03/2016 9:45 AM Medical Record Number: [id number] Patient Account Number: [id number] Date of Birth/Sex: [birth year] (56 y.o. Male) Treating RN: Ahmed Prima Primary Care Provider: [name] Other Clinician: Referring Provider: [name] Treating Provider/Extender: Frann Rider in Treatment: 0 Subjective Chief Complaint Information obtained from Patient Patient presents for treatment of an open ulcer due to venous insufficiency to the left lower extremity one on the anterior shin which has been there for about 8 months and one on the lower medial calf which has been there for about  2 weeks History of Present Illness (HPI) The following HPI elements were documented for the patient's wound: Location: left anterior shin and left medial calf Quality: Patient reports experiencing a dull pain to affected area(s). Severity: Patient states wound are getting worse. Duration: Patient has had the wound for > 3 months prior to seeking treatment at the wound center Timing: Pain in wound is Intermittent  (comes and goes Context: The wound appeared gradually over time Modifying Factors: Other treatment(s) tried include:had surgery on the left anterior shin for a hematoma which has been nonhealing for the last 8 months Associated Signs and Symptoms: Patient reports having increase swelling. 56 year old diabetic was last hemoglobin A1c was 7.2% has been reviewed today with 2 ulcerated areas in his left lower extremity which she's had for about 8 months and the one most recently was started as a blister for 2 weeks. he has a past medical history of atrial fibrillation, coronary artery disease, CHF, chronic venous stasis dermatitis, diabetes mellitus, hypertension, sleep apnea and is status post kidney transplant in 2012. He is a former smoker and quit cigars in 1996. In the remote past he's had some arterial and venous duplex studies done but does not recall the results no was ever treated for venous insufficiency. Wound History Patient presents with 1 open wound that has been present for approximately 2 weeks. Patient has been treating wound in the following manner: neosporin. Laboratory tests have not been performed in the last month. Patient reportedly has not tested positive for an antibiotic resistant organism. Patient reportedly has not tested positive for osteomyelitis. Patient reportedly has had testing performed to evaluate circulation in the legs. Patient experiences the following problems associated with their wounds: swelling. Patient History Information obtained from Patient. Allergies MARRELL, DICAPRIO ([id number]) morphine Family History Cancer - Maternal Grandparents, Paternal Grandparents, Diabetes - Maternal Grandparents, Heart Disease - Father, Kidney Disease - Father. Social History Former smoker - occasional years ago, Alcohol Use - Never, Drug Use - No History, Caffeine Use - Daily. Medical History Eyes Patient has history of Cataracts Respiratory Patient has history  of Asthma, Sleep Apnea Cardiovascular Patient has history of Congestive Heart Failure, Coronary Artery Disease, Hypertension Endocrine Patient has history of Type II Diabetes Genitourinary Patient has history of End Stage Renal Disease Musculoskeletal Patient has history of Gout Patient is treated with Insulin. Blood sugar is tested. Review of Systems (ROS) Constitutional Symptoms (General Health) The patient has no complaints or symptoms. Eyes Complains or has symptoms of Glasses / Contacts. Hematologic/Lymphatic The patient has no complaints or symptoms. Respiratory Complains or has symptoms of Shortness of Breath - with exertion, chronic bronchitis Cardiovascular CABG chronic venoustasis Gastrointestinal gerd Genitourinary kidney transplant Immunological The patient has no complaints or symptoms. Integumentary (Skin) The patient has no complaints or symptoms. Neurologic The patient has no complaints or symptoms. Oncologic The patient has no complaints or symptoms. Psychiatric depression AH, BOTT. ([id number]) Medications gabapentin 300 mg capsule oral capsule oral lisinopril 40 mg tablet oral tablet oral amlodipine 5 mg tablet oral tablet oral Eliquis 5 mg tablet oral tablet oral Cialis 20 mg tablet oral tablet oral Qvar 80 mcg/actuation Metered Aerosol oral inhaler inhalation aerosol inhalation mycophenolate sodium 180 mg tablet,delayed release oral tablet,delayed release (DR/EC) oral tacrolimus 0.5 mg capsule oral capsule oral Lantus U-100 Insulin 100 unit/mL subcutaneous solution subcutaneous solution subcutaneous Novolog Flexpen U-100 Insulin aspart 100 unit/mL subcutaneous subcutaneous insulin pen subcutaneous furosemide 40 mg tablet oral tablet oral multivitamin with minerals tablet oral  tablet oral hydrocodone-homatropine 5 mg-1.5 mg/5 mL syrup oral syrup oral mometasone 50 mcg/actuation nasal spray nasal spray,non-aerosol nasal bupropion HCl 75 mg  tablet oral tablet oral potassium chloride ER 10 mEq tablet,extended release oral tablet extended release oral omeprazole 20 mg capsule,delayed release oral capsule,delayed release(DR/EC) oral metolazone 2.5 mg tablet oral tablet oral Objective Constitutional Pulse regular. Respirations normal and unlabored. Afebrile. Vitals Time Taken: 9:44 AM, Height: 70 in, Source: Stated, Weight: 179 lbs, Source: Measured, BMI: 25.7, Temperature: 97.7 F, Pulse: 62 bpm, Respiratory Rate: 20 breaths/min, Blood Pressure: 158/78 mmHg. Eyes Nonicteric. Reactive to light. Ears, Nose, Mouth, and Throat Lips, teeth, and gums WNL.Marland Kitchen Moist mucosa without lesions. Neck supple and nontender. No palpable supraclavicular or cervical adenopathy. Normal sized without goiter. Respiratory WNL. No retractions.. Breath sounds WNL, No rubs, rales, rhonchi, or wheeze.Marland Kitchen COLLIE, WERNICK F. ([id number]) Cardiovascular Heart rhythm and rate regular, no murmur or gallop.. Pedal Pulses WNL. the ABI on the left was noncompressible. he has stage I lymphedema bilaterally and on the left lower extremity he has stigmata of venous disease. Chest Breasts symmetical and no nipple discharge.. Breast tissue WNL, no masses, lumps, or tenderness.. Gastrointestinal (GI) Abdomen without masses or tenderness.. No liver or spleen enlargement or tenderness.. Lymphatic No adneopathy. No adenopathy. No adenopathy. Musculoskeletal Adexa without tenderness or enlargement.. Digits and nails w/o clubbing, cyanosis, infection, petechiae, ischemia, or inflammatory conditions.Marland Kitchen Psychiatric Judgement and insight Intact.. No evidence of depression, anxiety, or agitation.. General Notes: the patient has significant stigmata of venous disease in the left lower extremity with some evidence of lymphedema. The hardened eschar on the anterior shin was gently removed with a #3 curet and below this there is good healing with some area of subcutaneous debris  and this was measured. On the lower medial calf he has a significant amount of debris with eschar and this was sharply removed to reveal a superficial ulceration. Integumentary (Hair, Skin) No suspicious lesions. No crepitus or fluctuance. No peri-wound warmth or erythema. No masses.. Wound #1 status is Open. Original cause of wound was Trauma. The wound is located on the Left,Anterior Lower Leg. The wound measures 3cm length x 1.6cm width x 0.1cm depth; 3.77cm^2 area and 0.377cm^3 volume. The wound is limited to skin breakdown. There is no tunneling or undermining noted. There is a none present amount of drainage noted. The wound margin is distinct with the outline attached to the wound base. There is no granulation within the wound bed. There is a large (67-100%) amount of necrotic tissue within the wound bed including Eschar. The periwound skin appearance exhibited: Dry/Scaly. Periwound temperature was noted as No Abnormality. The periwound has tenderness on palpation. Wound #2 status is Open. Original cause of wound was Blister. The wound is located on the Left,Medial Lower Leg. The wound measures 2.7cm length x 3cm width x 0.1cm depth; 6.362cm^2 area and 0.636cm^3 volume. The wound is limited to skin breakdown. There is no tunneling or undermining noted. There is a large amount of serosanguineous drainage noted. The wound margin is distinct with the outline attached to the wound base. There is no granulation within the wound bed. There is a large (67-100%) amount of necrotic tissue within the wound bed including Eschar and Adherent Slough. The periwound skin appearance exhibited: Dry/Scaly. Periwound temperature was noted as No Abnormality. The periwound has tenderness on palpation. JAMESPAUL, SECRIST ([id number]) Assessment Active Problems ICD-10 E11.622 - Type 2 diabetes mellitus with other skin ulcer I89.0 - Lymphedema, not elsewhere classified J95.974 -  Non-pressure chronic ulcer of  left calf with fat layer exposed I87.312 - Chronic venous hypertension (idiopathic) with ulcer of left lower extremity Z94.0 - Kidney transplant status this patient was fairly well-controlled diabetes mellitus now has signs and symptoms of venous insufficiency in the left lower extremity which need workup. Since his ABIs noncompressible we cannot use adequate compression and at the present time I have recommended: 1. Silver alginate to be applied and lightly covered with a Kerlix gauze and he may use his own compression stockings which he has been using for several years. 2. elevation and exercise have been discussed with him 3. will not use appropriate compression to his ABI has been confirmed. 4. Arterial duplex study and venous reflux studies to be done 5. discussed good control of his diabetes mellitus we will see him back next week Procedures Wound #1 Wound #1 is a Diabetic Wound/Ulcer of the Lower Extremity located on the Left,Anterior Lower Leg . There was a Skin/Subcutaneous Tissue Debridement ([id number]) debridement with total area of 4.8 sq cm performed by Christin Fudge, MD. with the following instrument(s): Curette to remove Viable and Non-Viable tissue/material including Exudate, Fibrin/Slough, and Subcutaneous after achieving pain control using Lidocaine 4% Topical Solution. A time out was conducted at 10:39, prior to the start of the procedure. A Minimum amount of bleeding was controlled with Pressure. The procedure was tolerated well with a pain level of 0 throughout and a pain level of 0 following the procedure. Post Debridement Measurements: 1.5cm length x 0.8cm width x 0.2cm depth; 0.188cm^3 volume. Character of Wound/Ulcer Post Debridement requires further debridement. Severity of Tissue Post Debridement is: Fat layer exposed. Post procedure Diagnosis Wound #1: Same as Pre-Procedure Wound #2 Wound #2 is a Diabetic Wound/Ulcer of the Lower Extremity located on the  Left,Medial Lower Leg . There was a Skin/Subcutaneous Tissue Debridement ([id number]) debridement with total area of 8.1 sq cm performed by Christin Fudge, MD. with the following instrument(s): Curette to remove Viable and Non-Viable GERON, MULFORD F. ([id number]) tissue/material including Exudate, Fibrin/Slough, and Subcutaneous after achieving pain control using Lidocaine 4% Topical Solution. A time out was conducted at 10:39, prior to the start of the procedure. A Minimum amount of bleeding was controlled with Pressure. The procedure was tolerated well with a pain level of 0 throughout and a pain level of 0 following the procedure. Post Debridement Measurements: 2.7cm length x 3cm width x 0.1cm depth; 0.636cm^3 volume. Character of Wound/Ulcer Post Debridement requires further debridement. Severity of Tissue Post Debridement is: Fat layer exposed. Post procedure Diagnosis Wound #2: Same as Pre-Procedure Plan Wound Cleansing: Wound #1 Left,Anterior Lower Leg: Clean wound with Normal Saline. Cleanse wound with mild soap and water May Shower, gently pat wound dry prior to applying new dressing. Wound #2 Left,Medial Lower Leg: Clean wound with Normal Saline. Cleanse wound with mild soap and water May Shower, gently pat wound dry prior to applying new dressing. Anesthetic: Wound #1 Left,Anterior Lower Leg: Topical Lidocaine 4% cream applied to wound bed prior to debridement Wound #2 Left,Medial Lower Leg: Topical Lidocaine 4% cream applied to wound bed prior to debridement Primary Wound Dressing: Wound #1 Left,Anterior Lower Leg: Aquacel Ag Wound #2 Left,Medial Lower Leg: Aquacel Ag Secondary Dressing: Wound #1 Left,Anterior Lower Leg: ABD and Kerlix/Conform Wound #2 Left,Medial Lower Leg: ABD and Kerlix/Conform Dressing Change Frequency: Wound #1 Left,Anterior Lower Leg: Change dressing every day. Wound #2 Left,Medial Lower Leg: Change dressing every day. Follow-up  Appointments: Wound #1 Left,Anterior Lower Leg: Return Appointment  in 1 week. Wound #2 Left,Medial Lower Leg: Return Appointment in 1 week. RONEL, RODEHEAVER ([id number]) Edema Control: Wound #1 Left,Anterior Lower Leg: Elevate legs to the level of the heart and pump ankles as often as possible Other: - pt to wear own compression stockings Wound #2 Left,Medial Lower Leg: Elevate legs to the level of the heart and pump ankles as often as possible Other: - pt to wear own compression stockings Additional Orders / Instructions: Wound #1 Left,Anterior Lower Leg: Increase protein intake. Wound #2 Left,Medial Lower Leg: Increase protein intake. Services and Therapies ordered were: Arterial Studies- Bilateral, Venous Studies -Bilateral this patient was fairly well-controlled diabetes mellitus now has signs and symptoms of venous insufficiency in the left lower extremity which need workup. Since his ABIs noncompressible we cannot use adequate compression and at the present time I have recommended: 1. Silver alginate to be applied and lightly covered with a Kerlix gauze and he may use his own compression stockings which he has been using for several years. 2. elevation and exercise have been discussed with him 3. will not use appropriate compression to his ABI has been confirmed. 4. Arterial duplex study and venous reflux studies to be done 5. discussed good control of his diabetes mellitus we will see him back next week Electronic Signature(s) Signed: 06/03/2016 11:45:07 AM By: Christin Fudge MD, FACS Entered By: Christin Fudge on 06/03/2016 11:45:06 [name] ([id number]) -------------------------------------------------------------------------------- ROS/PFSH Details Patient Name: [name]. Date of Service: 06/03/2016 9:45 AM Medical Record Number: [id number] Patient Account Number: [id number] Date of Birth/Sex: 1960/08/07 (56 y.o. Male) Treating RN: Ahmed Prima Primary  Care Provider: [name] Other Clinician: Referring Provider: [name] Treating Provider/Extender: Frann Rider in Treatment: 0 Information Obtained From Patient Wound History Do you currently have one or more open woundso Yes How many open wounds do you currently haveo 1 Approximately how long have you had your woundso 2 weeks How have you been treating your wound(s) until nowo neosporin Has your wound(s) ever healed and then re-openedo No Have you had any lab work done in the past montho No Have you tested positive for an antibiotic resistant organism (MRSA, VRE)o No Have you tested positive for osteomyelitis (bone infection)o No Have you had any tests for circulation on your legso Yes Where was the test doneo [facility] Have you had other problems associated with your woundso Swelling Eyes Complaints and Symptoms: Positive for: Glasses / Contacts Medical History: Positive for: Cataracts Respiratory Complaints and Symptoms: Positive for: Shortness of Breath - with exertion Review of System Notes: chronic bronchitis Medical History: Positive for: Asthma; Sleep Apnea Constitutional Symptoms (General Health) Complaints and Symptoms: No Complaints or Symptoms Hematologic/Lymphatic JESSIAH, STEINHART ([id number]) Complaints and Symptoms: No Complaints or Symptoms Cardiovascular Complaints and Symptoms: Review of System Notes: CABG chronic venoustasis Medical History: Positive for: Congestive Heart Failure; Coronary Artery Disease; Hypertension Gastrointestinal Complaints and Symptoms: Review of System Notes: gerd Endocrine Medical History: Positive for: Type II Diabetes Time with diabetes: 1997 Treated with: Insulin Blood sugar tested every day: Yes Tested : 3xs a day Genitourinary Complaints and Symptoms: Review of System Notes: kidney transplant Medical History: Positive for: End Stage Renal Disease Immunological Complaints and Symptoms: No  Complaints or Symptoms Integumentary (Skin) Complaints and Symptoms: No Complaints or Symptoms Musculoskeletal Medical History: Positive for: Gout ARMONTE, TORTORELLA. ([id number]) Neurologic Complaints and Symptoms: No Complaints or Symptoms Oncologic Complaints and Symptoms: No Complaints or Symptoms Psychiatric Complaints and Symptoms: Review of System Notes:  depression HBO Extended History Items Eyes: Cataracts Immunizations Pneumococcal Vaccine: Received Pneumococcal Vaccination: Yes Family and Social History Cancer: Yes - Maternal Grandparents, Paternal Grandparents; Diabetes: Yes - Maternal Grandparents; Heart Disease: Yes - Father; Kidney Disease: Yes - Father; Former smoker - occasional years ago; Alcohol Use: Never; Drug Use: No History; Caffeine Use: Daily; Financial Concerns: No; Food, Clothing or Shelter Needs: No; Support System Lacking: No; Transportation Concerns: No; Advanced Directives: No; Patient does not want information on Advanced Directives; Do not resuscitate: No; Living Will: Yes (Not Provided); Medical Power of Attorney: Yes - valerie Eifert (Not Provided) Physician Affirmation I have reviewed and agree with the above information. Electronic Signature(s) Signed: 06/03/2016 12:02:17 PM By: Alric Quan Signed: 06/03/2016 2:21:07 PM By: Christin Fudge MD, FACS Entered By: Christin Fudge on 06/03/2016 10:29:03 [name] ([id number]) -------------------------------------------------------------------------------- SuperBill Details Patient Name: DOYL, BITTING. Date of Service: 06/03/2016 Medical Record Number: [id number] Patient Account Number: [id number] Date of Birth/Sex: 1961/01/16 (56 y.o. Male) Treating RN: Ahmed Prima Primary Care Provider: [name] Other Clinician: Referring Provider: [name] Treating Provider/Extender: Christin Fudge Service Line: Outpatient Weeks in Treatment: 0 Diagnosis Coding ICD-10 Codes Code  Description [phone] Type 2 diabetes mellitus with other skin ulcer I89.0 Lymphedema, not elsewhere classified L97.222 Non-pressure chronic ulcer of left calf with fat layer exposed I87.312 Chronic venous hypertension (idiopathic) with ulcer of left lower extremity Z94.0 Kidney transplant status Facility Procedures CPT4 Code Description: 43200379 99213 - WOUND CARE VISIT-LEV 3 EST PT Modifier: Quantity: 1 CPT4 Code Description: [id number] 11042 - DEB SUBQ TISSUE 20 SQ CM/< ICD-10 Description Diagnosis E11.622 Type 2 diabetes mellitus with other skin ulcer I89.0 Lymphedema, not elsewhere classified L97.222 Non-pressure chronic ulcer of left calf with fat lay  I87.312 Chronic venous hypertension (idiopathic) with ulcer Modifier: er exposed of left lower Quantity: 1 extremity Physician Procedures CPT4 Code Description: 2224114 64314 - WC PHYS LEVEL 4 - NEW PT ICD-10 Description Diagnosis E11.622 Type 2 diabetes mellitus with other skin ulcer I89.0 Lymphedema, not elsewhere classified L97.222 Non-pressure chronic ulcer of left calf with fat lay  I87.312 Chronic venous hypertension (idiopathic) with ulcer Modifier: 25 er exposed of left lower Quantity: 1 extremity CPT4 Code Description: 2767011 00349 - WC PHYS SUBQ TISS 20 SQ CM ICD-10 Description Diagnosis JAYDIS, DUCHENE ([id number]) Modifier: Quantity: 1 Electronic Signature(s) Signed: 06/03/2016 12:02:17 PM By: Alric Quan Signed: 06/03/2016 2:21:07 PM By: Christin Fudge MD, FACS Previous Signature: 06/03/2016 11:45:45 AM Version By: Christin Fudge MD, FACS Entered By: Alric Quan on 06/03/2016 11:53:21

## 2016-06-03 NOTE — Progress Notes (Signed)
[name], [name] ([id number]) Visit Report for 06/03/2016 Abuse/Suicide Risk Screen Details Patient Name: [name], [name]. Date of Service: 06/03/2016 9:45 AM Medical Record Number: [id number] Patient Account Number: [id number] Date of Birth/Sex: 1961/01/15 (56 y.o. Male) Treating RN: Ahmed Prima Primary Care Jezebelle Ledwell: [name] Other Clinician: Referring Porter Nakama: [name] Treating Birt Reinoso/Extender: Frann Rider in Treatment: 0 Abuse/Suicide Risk Screen Items Answer ABUSE/SUICIDE RISK SCREEN: Has anyone close to you tried to hurt or harm you recentlyo No Do you feel uncomfortable with anyone in your familyo No Has anyone forced you do things that you didnot want to doo No Do you have any thoughts of harming yourselfo No Patient displays signs or symptoms of abuse and/or neglect. No Electronic Signature(s) Signed: 06/03/2016 12:02:17 PM By: Alric Quan Entered By: Alric Quan on 06/03/2016 09:58:12 Leis, Dorothea Glassman ([id number]) -------------------------------------------------------------------------------- Activities of Daily Living Details Patient Name: [name], [name]. Date of Service: 06/03/2016 9:45 AM Medical Record Number: [id number] Patient Account Number: [id number] Date of Birth/Sex: Jul 15, 1960 (56 y.o. Male) Treating RN: Ahmed Prima Primary Care Rhyan Radler: [name] Other Clinician: Referring Elspeth Blucher: [name] Treating Terrye Dombrosky/Extender: Frann Rider in Treatment: 0 Activities of Daily Living Items Answer Activities of Daily Living (Please select one for each item) Drive Automobile Completely Able Take Medications Completely Able Use Telephone Completely Able Care for Appearance Completely Able Use Toilet Completely Able Bath / Shower Completely Able Dress Self Completely Able Feed Self Completely Able Walk Completely Able Get In / Out Bed Completely Able Housework Completely Able Prepare Meals Completely  Able Handle Money Completely Able Shop for Self Completely Able Electronic Signature(s) Signed: 06/03/2016 12:02:17 PM By: Alric Quan Entered By: Alric Quan on 06/03/2016 09:58:30 Defeo, Dorothea Glassman ([id number]) -------------------------------------------------------------------------------- Education Assessment Details Patient Name: [name]. Date of Service: 06/03/2016 9:45 AM Medical Record Number: [id number] Patient Account Number: [id number] Date of Birth/Sex: 1960-07-16 (56 y.o. Male) Treating RN: Ahmed Prima Primary Care Deola Rewis: [name] Other Clinician: Referring Elaf Clauson: [name] Treating Kiernan Farkas/Extender: Frann Rider in Treatment: 0 Primary Learner Assessed: Patient Learning Preferences/Education Level/Primary Language Learning Preference: Explanation Highest Education Level: College or Above Preferred Language: English Cognitive Barrier Assessment/Beliefs Language Barrier: No Translator Needed: No Memory Deficit: No Emotional Barrier: No Cultural/Religious Beliefs Affecting Medical No Care: Physical Barrier Assessment Impaired Vision: Yes Glasses Impaired Hearing: No Decreased Hand dexterity: No Knowledge/Comprehension Assessment Knowledge Level: Medium Comprehension Level: Medium Ability to understand written Medium instructions: Ability to understand verbal Medium instructions: Motivation Assessment Anxiety Level: Calm Cooperation: Cooperative Education Importance: Acknowledges Need Interest in Health Problems: Asks Questions Perception: Coherent Willingness to Engage in Self- Medium Management Activities: Readiness to Engage in Self- Medium Management Activities: Electronic Signature(s) [name], [name] ([id number]) Signed: 06/03/2016 12:02:17 PM By: Alric Quan Entered By: Alric Quan on 06/03/2016 09:58:49 [name], [name]  ([id number]) -------------------------------------------------------------------------------- Fall Risk Assessment Details Patient Name: [name]. Date of Service: 06/03/2016 9:45 AM Medical Record Number: [id number] Patient Account Number: [id number] Date of Birth/Sex: May 29, 1960 (56 y.o. Male) Treating RN: Ahmed Prima Primary Care Kloe Oates: [name] Other Clinician: Referring Pernell Lenoir: [name] Treating Chanin Frumkin/Extender: Frann Rider in Treatment: 0 Fall Risk Assessment Items Have you had 2 or more falls in the last 12 monthso 0 No Have you had any fall that resulted in injury in the last 12 monthso 0 No FALL RISK ASSESSMENT: History of falling - immediate or within 3 months 0 No Secondary diagnosis 0 No Ambulatory aid None/bed rest/wheelchair/nurse 0 No Crutches/cane/walker 0 No Furniture  0 No IV Access/Saline Lock 0 No Gait/Training Normal/bed rest/immobile 0 No Weak 0 No Impaired 0 No Mental Status Oriented to own ability 0 Yes Electronic Signature(s) Signed: 06/03/2016 12:02:17 PM By: Alric Quan Entered By: Alric Quan on 06/03/2016 09:59:02 Ascher, Dorothea Glassman ([id number]) -------------------------------------------------------------------------------- Foot Assessment Details Patient Name: Carl Speller F. Date of Service: 06/03/2016 9:45 AM Medical Record Number: [id number] Patient Account Number: [id number] Date of Birth/Sex: [birth year] (56 y.o. Male) Treating RN: Ahmed Prima Primary Care Suprina Mand[place]: [name] Other Clinician: Referring Malikhi Ogan: [name] Treating Lyssa Hackley/Extender: Frann Rider in Treatment: 0 Foot Assessment Items Site Locations + = Sensation present, - = Sensation absent, C = Callus, U = Ulcer R = Redness, W = Warmth, M = Maceration, PU = Pre-ulcerative lesion F = Fissure, S = Swelling, D = Dryness Assessment Right: Left: Other Deformity: No No Prior Foot Ulcer: No No Prior  Amputation: No No Charcot Joint: No No Ambulatory Status: Ambulatory Without Help Gait: Steady Electronic Signature(s) Signed: 06/03/2016 12:02:17 PM By: Alric Quan Entered By: Alric Quan on 06/03/2016 10:02:34 [name] ([id number]) -------------------------------------------------------------------------------- Nutrition Risk Assessment Details Patient Name: Carl Speller F. Date of Service: 06/03/2016 9:45 AM Medical Record Number: [id number] Patient Account Number: [id number] Date of Birth/Sex: [birth year] (56 y.o. Male) Treating RN: Ahmed Prima Primary Care Tydus Sanmiguel: [name] Other Clinician: Referring Jarred Purtee: [name] Treating Nahuel Wilbert/Extender: Frann Rider in Treatment: 0 Height (in): 70 Weight (lbs): 179 Body Mass Index (BMI): 25.7 Nutrition Risk Assessment Items NUTRITION RISK SCREEN: I have an illness or condition that made me change the kind and/or 0 No amount of food I eat I eat fewer than two meals per day 0 No I eat few fruits and vegetables, or milk products 0 No I have three or more drinks of beer, liquor or wine almost every day 0 No I have tooth or mouth problems that make it hard for me to eat 0 No I don't always have enough money to buy the food I need 0 No I eat alone most of the time 0 No I take three or more different prescribed or over-the-counter drugs a 1 Yes day Without wanting to, I have lost or gained 10 pounds in the last six 0 No months I am not always physically able to shop, cook and/or feed myself 0 No Nutrition Protocols Good Risk Protocol Moderate Risk Protocol Electronic Signature(s) Signed: 06/03/2016 12:02:17 PM By: Alric Quan Entered By: Alric Quan on 06/03/2016 09:59:13

## 2016-06-05 ENCOUNTER — Other Ambulatory Visit (INDEPENDENT_AMBULATORY_CARE_PROVIDER_SITE_OTHER): Payer: 59

## 2016-06-05 ENCOUNTER — Ambulatory Visit (INDEPENDENT_AMBULATORY_CARE_PROVIDER_SITE_OTHER): Payer: 59 | Admitting: Vascular Surgery

## 2016-06-05 ENCOUNTER — Other Ambulatory Visit: Payer: Self-pay | Admitting: Surgery

## 2016-06-05 ENCOUNTER — Encounter (INDEPENDENT_AMBULATORY_CARE_PROVIDER_SITE_OTHER): Payer: Self-pay | Admitting: Vascular Surgery

## 2016-06-05 VITALS — BP 142/73 | HR 66 | Resp 17 | Wt 283.0 lb

## 2016-06-05 DIAGNOSIS — I872 Venous insufficiency (chronic) (peripheral): Secondary | ICD-10-CM | POA: Diagnosis not present

## 2016-06-05 DIAGNOSIS — L97921 Non-pressure chronic ulcer of unspecified part of left lower leg limited to breakdown of skin: Secondary | ICD-10-CM

## 2016-06-05 DIAGNOSIS — IMO0001 Reserved for inherently not codable concepts without codable children: Secondary | ICD-10-CM

## 2016-06-05 DIAGNOSIS — I890 Lymphedema, not elsewhere classified: Secondary | ICD-10-CM

## 2016-06-05 DIAGNOSIS — I87312 Chronic venous hypertension (idiopathic) with ulcer of left lower extremity: Secondary | ICD-10-CM

## 2016-06-05 DIAGNOSIS — I2510 Atherosclerotic heart disease of native coronary artery without angina pectoris: Secondary | ICD-10-CM

## 2016-06-05 DIAGNOSIS — L97929 Non-pressure chronic ulcer of unspecified part of left lower leg with unspecified severity: Secondary | ICD-10-CM

## 2016-06-05 DIAGNOSIS — L97222 Non-pressure chronic ulcer of left calf with fat layer exposed: Secondary | ICD-10-CM | POA: Diagnosis not present

## 2016-06-05 HISTORY — DX: Venous insufficiency (chronic) (peripheral): I87.2

## 2016-06-05 NOTE — Progress Notes (Signed)
Subjective:    Patient ID: [name], male    DOB: 12-Jan-1961, 56 y.o.   MRN: [id number] Chief Complaint  Patient presents with  . Follow-up   Presents as a new patient referred by Dr. Con Memos for left lower extremity non-healing ulceration. Patient endorses a history of a ulceration located on his left shin since July of 2017. He also states of a new ulceration located on his LLE which started as a "water blister". This has been present for three weeks. He has been treated once by Dr. Con Memos - he returns to his office for continued wound care next week. Patient has been wearing medical grade one compression stockings and elevating his legs for months with little relief. He denies any fever, nausea or vomiting. The patient underwent an ABI which showed medical calcification, normal tibial artery doppler waveforms, PPG waveforms and toe brachial indicis suggest normal arterial perfusion to bilateral lower extremity (no previous for comparison). A bilateral venous duplex was notable for venous reflux in the left GSV.    Review of Systems  Constitutional: Negative.   HENT: Negative.   Eyes: Negative.   Respiratory: Negative.   Cardiovascular: Positive for leg swelling (Left Lower Extremity Swelling).  Gastrointestinal: Negative.   Endocrine: Negative.   Genitourinary:       ESRD  Musculoskeletal: Negative.   Skin:       Left Lower Extremity Wound  Allergic/Immunologic: Negative.   Neurological: Negative.   Hematological: Negative.   Psychiatric/Behavioral: Negative.       Objective:   Physical Exam  Constitutional: He is oriented to person, place, and time. He appears well-developed and well-nourished.  HENT:  Head: Normocephalic.  Right Ear: External ear normal.  Left Ear: External ear normal.  Eyes: Conjunctivae and EOM are normal. Pupils are equal, round, and reactive to light.  Neck: Normal range of motion.  Cardiovascular: Normal rate, regular rhythm, normal heart sounds  and intact distal pulses.   Pulses:      Radial pulses are 2+ on the right side, and 2+ on the left side.       Dorsalis pedis pulses are 2+ on the right side, and 2+ on the left side.       Posterior tibial pulses are 2+ on the right side, and 2+ on the left side.  Pulmonary/Chest: Effort normal and breath sounds normal.  Musculoskeletal: Normal range of motion. He exhibits edema (Left Lower Extremity Moderate Edema.).  Neurological: He is alert and oriented to person, place, and time.  Skin:  Two non-infected ulcerations noted on front of left shin. Severe statis dermatitis noted on LLE. Thinning of skin noted on shin of LLE. No cellulitis noted.   Psychiatric: He has a normal mood and affect. His behavior is normal. Judgment and thought content normal.   BP (!) 142/73   Pulse 66   Resp 17   Wt 283 lb (128.4 kg)   BMI 39.47 kg/m   Past Medical History:  Diagnosis Date  . Amnestic MCI (mild cognitive impairment with memory loss)   . Asthma   . Atrial fibrillation ([place])   . Atrial fibrillation ([place]) 2016  . Benign neoplasm of colon   . CAD (coronary artery disease) 2005   had CABG  . CHF (congestive heart failure) ([place])   . Chronic venous stasis dermatitis of both lower extremities   . Cytomegaloviral disease ([place]) 2017  . Depression   . Diabetes mellitus   . Diverticulosis   .  Dyspnea   . Erectile dysfunction   . ESRD (end stage renal disease) ([place]) 2005   Dialysis then renal transplant  . GERD (gastroesophageal reflux disease)   . Gout   . Hyperlipidemia    low since renal failure  . Hypertension   . Kidney transplant status, cadaveric 2012  . Obesity   . Pneumonia   . Purpura ([place])   . Sleep apnea    bipap with oxygen 2 l  . Trigger finger   . Wears glasses    Social History   Social History  . Marital status: Married    Spouse name: N/A  . Number of children: 2  . Years of education: N/A   Occupational History  . 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 Smoking status: Former Smoker    Types: Cigars    Quit date: 09/02/1994  . Smokeless tobacco: Never Used     Comment: cigar- once in a while  . Alcohol use 0.0 - 0.6 oz/week     Comment: occasional- 3 in the past year  . Drug use: No  . Sexual activity: Not on file   Other Topics Concern  . Not on file   Social History Narrative   In school for culinary arts--- will finish 12/16      Has living will   Wife is health care POA   Would accept resuscitation attempts   Hasn't considered tube feedings   Past Surgical History:  Procedure Laterality Date  . BARIATRIC SURGERY    . CARDIAC CATHETERIZATION     [facility]  . COLONOSCOPY WITH PROPOFOL N/A 04/22/2013   Procedure: COLONOSCOPY WITH PROPOFOL;  Surgeon: [name], MD;  Location: [facility];  Service: Endoscopy;  Laterality: N/A;  . CORONARY ARTERY BYPASS GRAFT  2005   X 4  . DG ANGIO AV SHUNT*L*     right and left upper arms  . FASCIOTOMY  03/03/2012   Procedure: FASCIOTOMY;  Surgeon: [name], MD;  Location: [facility];  Service: Orthopedics;  Laterality: Right;  FASCIOTOMY RIGHT SMALL FINGER  . FASCIOTOMY Left 08/17/2013   Procedure: FASCIOTOMY LEFT RING;  Surgeon: [name], MD;  Location: [facility];  Service: Orthopedics;  Laterality: Left;  . INCISION AND DRAINAGE ABSCESS Left 10/15/2015   Procedure: INCISION AND DRAINAGE ABSCESS;  Surgeon: [name], MD;  Location: [facility];  Service: General;  Laterality: Left;  . KIDNEY TRANSPLANT  09/13/2010   cadaver--at [place]  1/12   left  . VASECTOMY     Family History  Problem Relation Age of Onset  . Heart disease Father   . Kidney failure Father   . Kidney disease Father   . Diabetes Maternal Grandmother   . Breast cancer Maternal Grandmother   . Liver cancer Paternal Uncle   . Liver cancer Paternal Grandmother   . Prostate cancer Neg  Hx    Allergies  Allergen Reactions  . Morphine Itching      Assessment & Plan:  Presents as a new patient referred by Dr. Con Memos for left lower extremity non-healing ulceration. Patient endorses a history of a ulceration located on his left shin since July of 2017. He also states of a new ulceration located on his LLE which started as a "water blister". This has been present for three weeks. He has been treated once by  Dr. Con Memos - he returns to his office for continued wound care next week. Patient has been wearing medical grade one compression stockings and elevating his legs for months with little relief. He denies any fever, nausea or vomiting. The patient underwent an ABI which showed medical calcification, normal tibial artery doppler waveforms, PPG waveforms and toe brachial indicis suggest normal arterial perfusion to bilateral lower extremity (no previous for comparison). A bilateral venous duplex was notable for venous reflux in the left GSV.   1. Chronic venous insufficiency - New Patient with LLE venous reflux in setting of non-healing ulcerations with normal arterial blood flow. Since conservative therapy has not helped recommend LLE endovenous laser ablation. Patient to continue compression and elevation for now. Patient to continue to see Dr. Con Memos for wound care. Will call patient with insurance approval.   2. Chronic ulcer of left leg, limited to breakdown of skin ([place]) - New Patient with LLE venous reflux in setting of non-healing ulcerations with normal arterial blood flow. Since conservative therapy has not helped recommend LLE endovenous laser ablation. Patient to continue compression and elevation for now. Patient to continue to see Dr. Con Memos for wound care. Will call patient with insurance approval.   Current Outpatient Prescriptions on File Prior to Visit  Medication Sig Dispense Refill  . amLODipine (NORVASC) 5 MG tablet TAKE 1 TABLET BY MOUTH DAILY 90 tablet 1    . beclomethasone (QVAR) 80 MCG/ACT inhaler Inhale 2 puffs into the lungs 2 (two) times daily. 1 Inhaler 6  . buPROPion (WELLBUTRIN) 75 MG tablet Take 1 tablet (75 mg total) by mouth 2 (two) times daily. 180 tablet 3  . CIALIS 20 MG tablet TAKE 1/2 TO 1 TABLET BY MOUTH EVERY OTHER DAY AS NEEDED 5 tablet 11  . ELIQUIS 5 MG TABS tablet TAKE 1 TABLET BY MOUTH 2 TIMES DAILY. 60 tablet 3  . furosemide (LASIX) 40 MG tablet Take 40 mg by mouth daily.     Marland Kitchen gabapentin (NEURONTIN) 300 MG capsule Take 300 mg by mouth 3 (three) times daily.    Marland Kitchen HYDROcodone-homatropine (HYCODAN) 5-1.5 MG/5ML syrup Take 5 mLs by mouth every 8 (eight) hours as needed for cough. 75 mL 0  . insulin aspart (NOVOLOG FLEXPEN) 100 UNIT/ML FlexPen Inject 5 Units into the skin 3 (three) times daily with meals as needed for high blood sugar. Pt uses as needed per sliding scale. Patient is taking 2-8 units tid    . insulin glargine (LANTUS) 100 unit/mL SOPN Inject 15 Units into the skin at bedtime.     Marland Kitchen lisinopril (PRINIVIL,ZESTRIL) 40 MG tablet TAKE 1 TABLET BY MOUTH DAILY 30 tablet 0  . metolazone (ZAROXOLYN) 2.5 MG tablet Take 2.5 mg by mouth every Monday, Wednesday, and Friday.     . mometasone (NASONEX) 50 MCG/ACT nasal spray Place 2 sprays into both nostrils at bedtime.    . mometasone (NASONEX) 50 MCG/ACT nasal spray PLACE 2 SPRAYS INTO THE NOSE AT BEDTIME. 17 g 0  . Multiple Vitamin (MULTIVITAMIN WITH MINERALS) TABS tablet Take 1 tablet by mouth daily.    . mycophenolate (MYFORTIC) 180 MG EC tablet Take 540 mg by mouth 2 (two) times daily.     Marland Kitchen omeprazole (PRILOSEC) 20 MG capsule TAKE 1 CAPSULE BY MOUTH ONCE DAILY 90 capsule 3  . potassium chloride (K-DUR,KLOR-CON) 10 MEQ tablet Take 30 mEq by mouth daily.    . tacrolimus (PROGRAF) 0.5 MG capsule Take 0.5 mg by mouth 2 (two) times daily.     Marland Kitchen  rosuvastatin (CRESTOR) 10 MG tablet Take 1 tablet (10 mg total) by mouth daily. 90 tablet 3   No current facility-administered  medications on file prior to visit.    There are no Patient Instructions on file for this visit. Return for Will call with insurance approval.  Sela Hua, PA-C

## 2016-06-10 ENCOUNTER — Other Ambulatory Visit: Payer: Self-pay

## 2016-06-10 ENCOUNTER — Encounter: Payer: 59 | Admitting: Surgery

## 2016-06-10 DIAGNOSIS — I509 Heart failure, unspecified: Secondary | ICD-10-CM | POA: Diagnosis not present

## 2016-06-10 DIAGNOSIS — I872 Venous insufficiency (chronic) (peripheral): Secondary | ICD-10-CM | POA: Diagnosis not present

## 2016-06-10 DIAGNOSIS — I890 Lymphedema, not elsewhere classified: Secondary | ICD-10-CM | POA: Diagnosis not present

## 2016-06-10 DIAGNOSIS — I132 Hypertensive heart and chronic kidney disease with heart failure and with stage 5 chronic kidney disease, or end stage renal disease: Secondary | ICD-10-CM | POA: Diagnosis not present

## 2016-06-10 DIAGNOSIS — L97222 Non-pressure chronic ulcer of left calf with fat layer exposed: Secondary | ICD-10-CM | POA: Diagnosis not present

## 2016-06-10 DIAGNOSIS — Z940 Kidney transplant status: Secondary | ICD-10-CM | POA: Diagnosis not present

## 2016-06-10 DIAGNOSIS — I87312 Chronic venous hypertension (idiopathic) with ulcer of left lower extremity: Secondary | ICD-10-CM | POA: Diagnosis not present

## 2016-06-10 DIAGNOSIS — E11622 Type 2 diabetes mellitus with other skin ulcer: Secondary | ICD-10-CM | POA: Diagnosis not present

## 2016-06-10 DIAGNOSIS — I4891 Unspecified atrial fibrillation: Secondary | ICD-10-CM | POA: Diagnosis not present

## 2016-06-10 DIAGNOSIS — L97822 Non-pressure chronic ulcer of other part of left lower leg with fat layer exposed: Secondary | ICD-10-CM | POA: Diagnosis not present

## 2016-06-10 DIAGNOSIS — I2510 Atherosclerotic heart disease of native coronary artery without angina pectoris: Secondary | ICD-10-CM | POA: Diagnosis not present

## 2016-06-10 NOTE — Patient Outreach (Signed)
[facility]) Care Management  06/10/2016  [name] February 18, 1961 [id number]   E-mail sent on 06/09/2016 to patient requesting he see me today.  I have sent him a second e-mail today asking [facility] to let me know what date and times he has available next week.    [name], RN, BA, [place], [place] Direct Dial:  [phone]  Fax:  [phone] E-mail: [name] [address] [place], [place]  [address]

## 2016-06-10 NOTE — Progress Notes (Addendum)
JULIAN, MEDINA ([id number]) Visit Report for 06/10/2016 Chief Complaint Document Details Patient Name: [name], [name]. Date of Service: 06/10/2016 11:00 AM Medical Record Number: [id number] Patient Account Number: [id number] Date of Birth/Sex: [birth year] (56 y.o. Male) Treating RN: Ahmed Prima Primary Care Provider: [name] Other Clinician: Referring Provider: [name] Treating Provider/Extender: Frann Rider in Treatment: 1 Information Obtained from: Patient Chief Complaint Patient presents for treatment of an open ulcer due to venous insufficiency to the left lower extremity one on the anterior shin which has been there for about 8 months and one on the lower medial calf which has been there for about 2 weeks Electronic Signature(s) Signed: 06/10/2016 11:32:35 AM By: Christin Fudge MD, FACS Entered By: Christin Fudge on 06/10/2016 11:32:35 [name] ([id number]) -------------------------------------------------------------------------------- Debridement Details Patient Name: [name]. Date of Service: 06/10/2016 11:00 AM Medical Record Number: [id number] Patient Account Number: [id number] Date of Birth/Sex: 04-20-1960 (56 y.o. Male) Treating RN: Ahmed Prima Primary Care Provider: [name] Other Clinician: Referring Provider: [name] Treating Provider/Extender: Frann Rider in Treatment: 1 Debridement Performed for Wound #1 Left,Anterior Lower Leg Assessment: Performed By: Physician Christin Fudge, MD Debridement: Debridement Pre-procedure Yes - 11:18 Verification/Time Out Taken: Start Time: 11:20 Pain Control: Lidocaine 4% Topical Solution Level: Skin/Subcutaneous Tissue Total Area Debrided (L x 2.5 (cm) x 1 (cm) = 2.5 (cm) W): Tissue and other Viable, Non-Viable, Exudate, Fibrin/Slough, Subcutaneous material debrided: Instrument: Curette Bleeding: Minimum Hemostasis Achieved: Pressure End Time:  11:22 Procedural Pain: 0 Post Procedural Pain: 0 Response to Treatment: Procedure was tolerated well Post Debridement Measurements of Total Wound Length: (cm) 2.5 Width: (cm) 0.5 Depth: (cm) 0.1 Volume: (cm) 0.098 Character of Wound/Ulcer Post Requires Further Debridement Debridement: Severity of Tissue Post Debridement: Fat layer exposed Post Procedure Diagnosis Same as Pre-procedure Electronic Signature(s) Signed: 06/10/2016 11:32:21 AM By: Christin Fudge MD, FACS Signed: 06/10/2016 4:38:07 PM By: Alric Quan Entered By: Christin Fudge on 06/10/2016 11:32:21 [name], [name] ([id number] Galluzzo, [name] ([id number]) -------------------------------------------------------------------------------- Debridement Details Patient Name: [name]. Date of Service: 06/10/2016 11:00 AM Medical Record Number: [id number] Patient Account Number: [id number] Date of Birth/Sex: 05/25/1960 (56 y.o. Male) Treating RN: Ahmed Prima Primary Care Provider: [name] Other Clinician: Referring Provider: [name] Treating Provider/Extender: Frann Rider in Treatment: 1 Debridement Performed for Wound #2 Left,Medial Lower Leg Assessment: Performed By: Physician Christin Fudge, MD Debridement: Debridement Pre-procedure Yes - 11:18 Verification/Time Out Taken: Start Time: 11:19 Pain Control: Lidocaine 4% Topical Solution Level: Skin/Subcutaneous Tissue Total Area Debrided (L x 1.3 (cm) x 1.3 (cm) = 1.69 (cm) W): Tissue and other Viable, Non-Viable, Exudate, Fibrin/Slough, Subcutaneous material debrided: Instrument: Curette Bleeding: Minimum Hemostasis Achieved: Pressure End Time: 11:20 Procedural Pain: 0 Post Procedural Pain: 0 Response to Treatment: Procedure was tolerated well Post Debridement Measurements of Total Wound Length: (cm) 1.3 Width: (cm) 1.3 Depth: (cm) 0.1 Volume: (cm) 0.133 Character of Wound/Ulcer Post Requires Further  Debridement Debridement: Severity of Tissue Post Debridement: Fat layer exposed Post Procedure Diagnosis Same as Pre-procedure Electronic Signature(s) Signed: 06/10/2016 11:32:28 AM By: Christin Fudge MD, FACS Signed: 06/10/2016 4:38:07 PM By: Alric Quan Entered By: Christin Fudge on 06/10/2016 11:32:28 [name], [name] ([id number]) [name], [name] ([id number]) -------------------------------------------------------------------------------- HPI Details Patient Name: [name], [name]. Date of Service: 06/10/2016 11:00 AM Medical Record Number: [id number] Patient Account Number: [id number] Date of Birth/Sex: March 19, 1961 (56 y.o. Male) Treating RN: Ahmed Prima Primary Care Provider: [name] Other Clinician: Referring Provider: [name] Treating Provider/Extender:  Dakarai Mcglocklin Weeks in Treatment: 1 History of Present Illness Location: left anterior shin and left medial calf Quality: Patient reports experiencing a dull pain to affected area(s). Severity: Patient states wound are getting worse. Duration: Patient has had the wound for > 3 months prior to seeking treatment at the wound center Timing: Pain in wound is Intermittent (comes and goes Context: The wound appeared gradually over time Modifying Factors: Other treatment(s) tried include:had surgery on the left anterior shin for a hematoma which has been nonhealing for the last 8 months Associated Signs and Symptoms: Patient reports having increase swelling. HPI Description: 56 year old diabetic was last hemoglobin A1c was 7.2% has been reviewed today with 2 ulcerated areas in his left lower extremity which she's had for about 8 months and the one most recently was started as a blister for 2 weeks. he has a past medical history of atrial fibrillation, coronary artery disease, CHF, chronic venous stasis dermatitis, diabetes mellitus, hypertension, sleep apnea and is status post kidney transplant in 2012. He is a  former smoker and quit cigars in 1996. In the remote past he's had some arterial and venous duplex studies done but does not recall the results no was ever treated for venous insufficiency. 06/10/2016 -- -- had a office visit at [facility] seen by the physicianos assistant [facility]. Patient underwent an ABI which showed medial calcification, normal tibial artery Doppler waveform, PPG waveforms and toe brachial indices suggest normal arterial perfusion to bilateral lower extremities. right TBI was 0.97 and left was 0.84 Bilateral venous duplex was notable for venous reflux in the left greater saphenous vein and popliteal veins.. A left lower extremity endovenous laser ablation was recommended. Electronic Signature(s) Signed: 06/20/2016 8:13:20 AM By: Christin Fudge MD, FACS Previous Signature: 06/10/2016 12:07:01 PM Version By: Christin Fudge MD, FACS Previous Signature: 06/10/2016 11:32:45 AM Version By: Christin Fudge MD, FACS Previous Signature: 06/10/2016 11:16:47 AM Version By: Christin Fudge MD, FACS Entered By: Christin Fudge on 06/20/2016 08:13:20 [name], [name] ([id number]) -------------------------------------------------------------------------------- Physical Exam Details Patient Name: [name], [name]. Date of Service: 06/10/2016 11:00 AM Medical Record Number: [id number] Patient Account Number: [id number] Date of Birth/Sex: January 22, 1961 (56 y.o. Male) Treating RN: Ahmed Prima Primary Care Provider: [name] Other Clinician: Referring Provider: [name] Treating Provider/Extender: Frann Rider in Treatment: 1 Constitutional . Pulse regular. Respirations normal and unlabored. Afebrile. . Eyes Nonicteric. Reactive to light. Ears, Nose, Mouth, and Throat Lips, teeth, and gums WNL.Marland Kitchen Moist mucosa without lesions. Neck supple and nontender. No palpable supraclavicular or cervical adenopathy. Normal sized without goiter. Respiratory WNL.  No retractions.. Breath sounds WNL, No rubs, rales, rhonchi, or wheeze.. Cardiovascular Heart rhythm and rate regular, no murmur or gallop.. Pedal Pulses WNL. No clubbing, cyanosis or edema. Chest Breasts symmetical and no nipple discharge.. Breast tissue WNL, no masses, lumps, or tenderness.. Lymphatic No adneopathy. No adenopathy. No adenopathy. Musculoskeletal Adexa without tenderness or enlargement.. Digits and nails w/o clubbing, cyanosis, infection, petechiae, ischemia, or inflammatory conditions.. Integumentary (Hair, Skin) No suspicious lesions. No crepitus or fluctuance. No peri-wound warmth or erythema. No masses.Marland Kitchen Psychiatric Judgement and insight Intact.. No evidence of depression, anxiety, or agitation.. Notes the lymphedema persists and the eschar and subcutaneous debris on the left lower extremity was removed with a #3 curet and bleeding controlled with pressure. Electronic Signature(s) Signed: 06/10/2016 11:33:16 AM By: Christin Fudge MD, FACS Entered By: Christin Fudge on 06/10/2016 11:33:16 [name] ([id number]) -------------------------------------------------------------------------------- Physician Orders Details Patient Name: [name]  F. Date of Service: 06/10/2016 11:00 AM Medical Record Number: [id number] Patient Account Number: [id number] Date of Birth/Sex: [birth year] (56 y.o. Male) Treating RN: Ahmed Prima Primary Care Provider: [name] Other Clinician: Referring Provider: [name] Treating Provider/Extender: Frann Rider in Treatment: 1 Verbal / Phone Orders: Yes Clinician: Pinkerton, Debi Read Back and Verified: Yes Diagnosis Coding Wound Cleansing Wound #1 Left,Anterior Lower Leg o Clean wound with Normal Saline. o Cleanse wound with mild soap and water o May Shower, gently pat wound dry prior to applying new dressing. Wound #2 Left,Medial Lower Leg o Clean wound with Normal Saline. o Cleanse wound with  mild soap and water o May Shower, gently pat wound dry prior to applying new dressing. Anesthetic Wound #1 Left,Anterior Lower Leg o Topical Lidocaine 4% cream applied to wound bed prior to debridement Wound #2 Left,Medial Lower Leg o Topical Lidocaine 4% cream applied to wound bed prior to debridement Primary Wound Dressing Wound #1 Left,Anterior Lower Leg o Aquacel Ag Wound #2 Left,Medial Lower Leg o Aquacel Ag Secondary Dressing Wound #1 Left,Anterior Lower Leg o ABD pad Wound #2 Left,Medial Lower Leg o ABD pad Dressing Change Frequency Wound #1 Left,Anterior Lower Leg o Change dressing every week [name], [name]. ([id number]) Wound #2 Left,Medial Lower Leg o Change dressing every week Follow-up Appointments Wound #1 Left,Anterior Lower Leg o Return Appointment in 1 week. Wound #2 Left,Medial Lower Leg o Return Appointment in 1 week. Edema Control Wound #1 Left,Anterior Lower Leg o 3 Layer Compression System - Left Lower Extremity - unna to anchor o Elevate legs to the level of the heart and pump ankles as often as possible Wound #2 Left,Medial Lower Leg o 3 Layer Compression System - Left Lower Extremity - unna to anchor o Elevate legs to the level of the heart and pump ankles as often as possible Additional Orders / Instructions Wound #1 Left,Anterior Lower Leg o Increase protein intake. Wound #2 Left,Medial Lower Leg o Increase protein intake. Electronic Signature(s) Signed: 06/10/2016 4:38:07 PM By: Alric Quan Signed: 06/10/2016 4:53:16 PM By: Christin Fudge MD, FACS Entered By: Alric Quan on 06/10/2016 11:23:05 [name] ([id number]) -------------------------------------------------------------------------------- Problem List Details Patient Name: [name], [name]. Date of Service: 06/10/2016 11:00 AM Medical Record Number: [id number] Patient Account Number: [id number] Date of Birth/Sex: [birth year] (55 y.o.  Male) Treating RN: Ahmed Prima Primary Care Provider: [name] Other Clinician: Referring Provider: [name] Treating Provider/Extender: Frann Rider in Treatment: 1 Active Problems ICD-10 Encounter Code Description Active Date Diagnosis E11.622 Type 2 diabetes mellitus with other skin ulcer 06/03/2016 Yes I89.0 Lymphedema, not elsewhere classified 06/03/2016 Yes L97.222 Non-pressure chronic ulcer of left calf with fat layer 06/03/2016 Yes exposed I87.312 Chronic venous hypertension (idiopathic) with ulcer of left 06/03/2016 Yes lower extremity Z94.0 Kidney transplant status 06/03/2016 Yes Inactive Problems Resolved Problems Electronic Signature(s) Signed: 06/10/2016 11:32:09 AM By: Christin Fudge MD, FACS Entered By: Christin Fudge on 06/10/2016 11:32:08 [name] ([id number]) -------------------------------------------------------------------------------- Progress Note Details Patient Name: [name] F. Date of Service: 06/10/2016 11:00 AM Medical Record Number: [id number] Patient Account Number: [id number] Date of Birth/Sex: 1960/06/02 (56 y.o. Male) Treating RN: Ahmed Prima Primary Care Provider: [name] Other Clinician: Referring Provider: [name] Treating Provider/Extender: Frann Rider in Treatment: 1 Subjective Chief Complaint Information obtained from Patient Patient presents for treatment of an open ulcer due to venous insufficiency to the left lower extremity one on the anterior shin which has been there for about 8 months and one  on the lower medial calf which has been there for about 2 weeks History of Present Illness (HPI) The following HPI elements were documented for the patient's wound: Location: left anterior shin and left medial calf Quality: Patient reports experiencing a dull pain to affected area(s). Severity: Patient states wound are getting worse. Duration: Patient has had the wound for > 3  months prior to seeking treatment at the wound center Timing: Pain in wound is Intermittent (comes and goes Context: The wound appeared gradually over time Modifying Factors: Other treatment(s) tried include:had surgery on the left anterior shin for a hematoma which has been nonhealing for the last 8 months Associated Signs and Symptoms: Patient reports having increase swelling. 56 year old diabetic was last hemoglobin A1c was 7.2% has been reviewed today with 2 ulcerated areas in his left lower extremity which she's had for about 8 months and the one most recently was started as a blister for 2 weeks. he has a past medical history of atrial fibrillation, coronary artery disease, CHF, chronic venous stasis dermatitis, diabetes mellitus, hypertension, sleep apnea and is status post kidney transplant in 2012. He is a former smoker and quit cigars in 1996. In the remote past he's had some arterial and venous duplex studies done but does not recall the results no was ever treated for venous insufficiency. 06/10/2016 -- -- had a office visit at [facility] seen by the physician s assistant Marcelle Overlie. Patient underwent an ABI which showed medial calcification, normal tibial artery Doppler waveform, PPG waveforms and toe brachial indices suggest normal arterial perfusion to bilateral lower extremities. right TBI was 0.97 and left was 0.84 Bilateral venous duplex was notable for venous reflux in the left greater saphenous vein and popliteal veins.. A left lower extremity endovenous laser ablation was recommended. IDUS, RATHKE F. ([id number]) Objective Constitutional Pulse regular. Respirations normal and unlabored. Afebrile. Vitals Time Taken: 11:00 AM, Height: 70 in, Weight: 179 lbs, BMI: 25.7, Temperature: 97.5 F, Pulse: 56 bpm, Respiratory Rate: 20 breaths/min, Blood Pressure: 148/80 mmHg. Eyes Nonicteric. Reactive to light. Ears, Nose, Mouth, and Throat Lips, teeth,  and gums WNL.Marland Kitchen Moist mucosa without lesions. Neck supple and nontender. No palpable supraclavicular or cervical adenopathy. Normal sized without goiter. Respiratory WNL. No retractions.. Breath sounds WNL, No rubs, rales, rhonchi, or wheeze.. Cardiovascular Heart rhythm and rate regular, no murmur or gallop.. Pedal Pulses WNL. No clubbing, cyanosis or edema. Chest Breasts symmetical and no nipple discharge.. Breast tissue WNL, no masses, lumps, or tenderness.. Lymphatic No adneopathy. No adenopathy. No adenopathy. Musculoskeletal Adexa without tenderness or enlargement.. Digits and nails w/o clubbing, cyanosis, infection, petechiae, ischemia, or inflammatory conditions.Marland Kitchen Psychiatric Judgement and insight Intact.. No evidence of depression, anxiety, or agitation.. General Notes: the lymphedema persists and the eschar and subcutaneous debris on the left lower extremity was removed with a #3 curet and bleeding controlled with pressure. Integumentary (Hair, Skin) No suspicious lesions. No crepitus or fluctuance. No peri-wound warmth or erythema. No masses.. Wound #1 status is Open. Original cause of wound was Trauma. The wound is located on the Left,Anterior Lower Leg. The wound measures 2.5cm length x 1cm width x 0.1cm depth; 1.963cm^2 area and 0.196cm^3 volume. The wound is limited to skin breakdown. There is no tunneling or undermining noted. There is a none present amount of drainage noted. The wound margin is distinct with the outline attached to the wound base. There is no granulation within the wound bed. There is a large (67-100%) amount of Vaccarella, Franchot F. (  [id number]) necrotic tissue within the wound bed including Eschar. The periwound skin appearance exhibited: Dry/Scaly. Periwound temperature was noted as No Abnormality. The periwound has tenderness on palpation. Wound #2 status is Open. Original cause of wound was Blister. The wound is located on the Left,Medial Lower Leg. The  wound measures 1.3cm length x 1.3cm width x 0.1cm depth; 1.327cm^2 area and 0.133cm^3 volume. The wound is limited to skin breakdown. There is no tunneling or undermining noted. There is a large amount of serosanguineous drainage noted. The wound margin is distinct with the outline attached to the wound base. There is large (67-100%) red granulation within the wound bed. There is a small (1-33%) amount of necrotic tissue within the wound bed including Adherent Slough. The periwound skin appearance exhibited: Dry/Scaly. Periwound temperature was noted as No Abnormality. The periwound has tenderness on palpation. Assessment Active Problems ICD-10 E11.622 - Type 2 diabetes mellitus with other skin ulcer I89.0 - Lymphedema, not elsewhere classified L97.222 - Non-pressure chronic ulcer of left calf with fat layer exposed I87.312 - Chronic venous hypertension (idiopathic) with ulcer of left lower extremity Z94.0 - Kidney transplant status having recently reviewed his arterial and venous workup I have recommended: 1. silver alginate with a 3 layer Profore 2. Elevation and exercise 3. Await his venous ablation 4. continue good control of his diabetes mellitus 5. Regular visits to the wound center Procedures Wound #1 Wound #1 is a Diabetic Wound/Ulcer of the Lower Extremity located on the Left,Anterior Lower Leg . There was a Skin/Subcutaneous Tissue Debridement (52841-32440) debridement with total area of 2.5 sq cm performed by Christin Fudge, MD. with the following instrument(s): Curette to remove Viable and Non-Viable tissue/material including Exudate, Fibrin/Slough, and Subcutaneous after achieving pain control using Lidocaine 4% Topical Solution. A time out was conducted at 11:18, prior to the start of the procedure. A Minimum amount of bleeding was controlled with Pressure. The procedure was tolerated well with a pain level of 0 throughout and a pain level of 0 following the procedure. Post  Debridement Measurements: 2.5cm Berish, Russel F. ([id number]) length x 0.5cm width x 0.1cm depth; 0.098cm^3 volume. Character of Wound/Ulcer Post Debridement requires further debridement. Severity of Tissue Post Debridement is: Fat layer exposed. Post procedure Diagnosis Wound #1: Same as Pre-Procedure Wound #2 Wound #2 is a Diabetic Wound/Ulcer of the Lower Extremity located on the Left,Medial Lower Leg . There was a Skin/Subcutaneous Tissue Debridement ([id number]) debridement with total area of 1.69 sq cm performed by Christin Fudge, MD. with the following instrument(s): Curette to remove Viable and Non-Viable tissue/material including Exudate, Fibrin/Slough, and Subcutaneous after achieving pain control using Lidocaine 4% Topical Solution. A time out was conducted at 11:18, prior to the start of the procedure. A Minimum amount of bleeding was controlled with Pressure. The procedure was tolerated well with a pain level of 0 throughout and a pain level of 0 following the procedure. Post Debridement Measurements: 1.3cm length x 1.3cm width x 0.1cm depth; 0.133cm^3 volume. Character of Wound/Ulcer Post Debridement requires further debridement. Severity of Tissue Post Debridement is: Fat layer exposed. Post procedure Diagnosis Wound #2: Same as Pre-Procedure Plan Wound Cleansing: Wound #1 Left,Anterior Lower Leg: Clean wound with Normal Saline. Cleanse wound with mild soap and water May Shower, gently pat wound dry prior to applying new dressing. Wound #2 Left,Medial Lower Leg: Clean wound with Normal Saline. Cleanse wound with mild soap and water May Shower, gently pat wound dry prior to applying new dressing. Anesthetic: Wound #1 Left,Anterior Lower  Leg: Topical Lidocaine 4% cream applied to wound bed prior to debridement Wound #2 Left,Medial Lower Leg: Topical Lidocaine 4% cream applied to wound bed prior to debridement Primary Wound Dressing: Wound #1 Left,Anterior Lower  Leg: Aquacel Ag Wound #2 Left,Medial Lower Leg: Aquacel Ag Secondary Dressing: Wound #1 Left,Anterior Lower Leg: ABD pad Wound #2 Left,Medial Lower Leg: ABD pad QUINTON, VOTH F. ([id number]) Dressing Change Frequency: Wound #1 Left,Anterior Lower Leg: Change dressing every week Wound #2 Left,Medial Lower Leg: Change dressing every week Follow-up Appointments: Wound #1 Left,Anterior Lower Leg: Return Appointment in 1 week. Wound #2 Left,Medial Lower Leg: Return Appointment in 1 week. Edema Control: Wound #1 Left,Anterior Lower Leg: 3 Layer Compression System - Left Lower Extremity - unna to anchor Elevate legs to the level of the heart and pump ankles as often as possible Wound #2 Left,Medial Lower Leg: 3 Layer Compression System - Left Lower Extremity - unna to anchor Elevate legs to the level of the heart and pump ankles as often as possible Additional Orders / Instructions: Wound #1 Left,Anterior Lower Leg: Increase protein intake. Wound #2 Left,Medial Lower Leg: Increase protein intake. having recently reviewed his arterial and venous workup I have recommended: 1. silver alginate with a 3 layer Profore 2. Elevation and exercise 3. Await his venous ablation 4. continue good control of his diabetes mellitus 5. Regular visits to the wound center Electronic Signature(s) Signed: 06/20/2016 8:14:00 AM By: Christin Fudge MD, FACS Previous Signature: 06/10/2016 12:07:20 PM Version By: Christin Fudge MD, FACS Previous Signature: 06/10/2016 11:34:47 AM Version By: Christin Fudge MD, FACS Entered By: Christin Fudge on 06/20/2016 08:14:00 [name] ([id number]) -------------------------------------------------------------------------------- SuperBill Details Patient Name: INDIO, [name]. Date of Service: 06/10/2016 Medical Record Number: [id number] Patient Account Number: [id number] Date of Birth/Sex: 22-Dec-1960 (56 y.o. Male) Treating RN: Ahmed Prima Primary Care Provider:  [name] Other Clinician: Referring Provider: [name] Treating Provider/Extender: Frann Rider in Treatment: 1 Diagnosis Coding ICD-10 Codes Code Description [phone] Type 2 diabetes mellitus with other skin ulcer I89.0 Lymphedema, not elsewhere classified L97.222 Non-pressure chronic ulcer of left calf with fat layer exposed I87.312 Chronic venous hypertension (idiopathic) with ulcer of left lower extremity Z94.0 Kidney transplant status Facility Procedures CPT4 Code: [id number] Description: 37628 - DEB SUBQ TISSUE 20 SQ CM/< ICD-10 Description Diagnosis E11.622 Type 2 diabetes mellitus with other skin ulcer I89.0 Lymphedema, not elsewhere classified L97.222 Non-pressure chronic ulcer of left calf with fat l Modifier: ayer exposed Quantity: 1 Physician Procedures CPT4 Code: [id number] Description: 99213 - WC PHYS LEVEL 3 - EST PT ICD-10 Description Diagnosis E11.622 Type 2 diabetes mellitus with other skin ulcer I89.0 Lymphedema, not elsewhere classified L97.222 Non-pressure chronic ulcer of left calf with fat l Modifier: 25 ayer exposed Quantity: 1 CPT4 Code: 6073710 Pecor, Japheth Description: 11042 - WC PHYS SUBQ TISS 20 SQ CM ICD-10 Description Diagnosis E11.622 Type 2 diabetes mellitus with other skin ulcer I89.0 Lymphedema, not elsewhere classified 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 ([id number]) Modifier: ayer exposed Quantity: 1 Electronic Signature(s) Signed: 06/10/2016 11:35:06 AM By: Christin Fudge MD, FACS Entered By: Christin Fudge on 06/10/2016 11:35:05

## 2016-06-11 ENCOUNTER — Other Ambulatory Visit: Payer: Self-pay | Admitting: Cardiovascular Disease

## 2016-06-11 NOTE — Progress Notes (Signed)
[name], [name] ([id number]) Visit Report for 06/10/2016 Arrival Information Details Patient Name: [name], [name]. Date of Service: 06/10/2016 11:00 AM Medical Record Number: [id number] Patient Account Number: [id number] Date of Birth/Sex: [birth year] (56 y.o. Male) Treating RN: Ahmed Prima Primary Care Bucky Grigg: [name] Other Clinician: Referring Stefany Starace: [name] Treating Kaytelynn Scripter/Extender: Frann Rider in Treatment: 1 Visit Information History Since Last Visit All ordered tests and consults were completed: No Patient Arrived: Ambulatory Added or deleted any medications: No Arrival Time: 10:59 Any new allergies or adverse reactions: No Accompanied By: self Had a fall or experienced change in No Transfer Assistance: None activities of daily living that may affect Patient Identification Verified: Yes risk of falls: Secondary Verification Process Yes Signs or symptoms of abuse/neglect since last No Completed: visito Patient Requires Transmission-Based No Hospitalized since last visit: No Precautions: Has Dressing in Place as Prescribed: Yes Patient Has Alerts: Yes Has Compression in Place as Prescribed: Yes Patient Alerts: DM II Pain Present Now: No Eliquis Electronic Signature(s) Signed: 06/10/2016 4:38:07 PM By: Alric Quan Entered By: Alric Quan on 06/10/2016 11:00:17 [name] ([id number]) -------------------------------------------------------------------------------- Encounter Discharge Information Details Patient Name: [name]. Date of Service: 06/10/2016 11:00 AM Medical Record Number: [id number] Patient Account Number: [id number] Date of Birth/Sex: [birth year] (56 y.o. Male) Treating RN: Ahmed Prima Primary Care Ina Scrivens: [name] Other Clinician: Referring Jomaira Darr: [name] Treating Kailah Pennel/Extender: Frann Rider in Treatment: 1 Encounter Discharge Information Items Discharge Pain  Level: 0 Discharge Condition: Stable Ambulatory Status: Ambulatory Discharge Destination: Home Transportation: Private Auto Accompanied By: self Schedule Follow-up Appointment: Yes Medication Reconciliation completed and provided to Patient/Care No Inger Wiest: Provided on Clinical Summary of Care: 06/10/2016 Form Type Recipient Paper Patient JG Electronic Signature(s) Signed: 06/10/2016 11:38:50 AM By: Ruthine Dose Entered By: Ruthine Dose on 06/10/2016 11:38:50 [name], [name] ([id number]) -------------------------------------------------------------------------------- Lower Extremity Assessment Details Patient Name: Carl Speller F. Date of Service: 06/10/2016 11:00 AM Medical Record Number: [id number] Patient Account Number: [id number] Date of Birth/Sex: 1961/01/17 (56 y.o. Male) Treating RN: Ahmed Prima Primary Care Dreyden Rohrman: [name] Other Clinician: Referring Monita Swier: [name] Treating Vaden Becherer/Extender: Frann Rider in Treatment: 1 Vascular Assessment Pulses: Dorsalis Pedis Palpable: [Left:Yes] Posterior Tibial Extremity colors, hair growth, and conditions: Extremity Color: [Left:Hyperpigmented] Temperature of Extremity: [Left:Warm] Capillary Refill: [Left:< 3 seconds] Electronic Signature(s) Signed: 06/10/2016 4:38:07 PM By: Alric Quan Entered By: Alric Quan on 06/10/2016 11:09:17 [place], [name] ([id number]) -------------------------------------------------------------------------------- Multi Wound Chart Details Patient Name: Carl Speller F. Date of Service: 06/10/2016 11:00 AM Medical Record Number: [id number] Patient Account Number: [id number] Date of Birth/Sex: 27-Jun-1960 (56 y.o. Male) Treating RN: Ahmed Prima Primary Care Romy Mcgue: [name] Other Clinician: Referring Joselinne Lawal: [name] Treating Dejon Lukas/Extender: Frann Rider in Treatment: 1 Vital Signs Height(in): 70 Pulse(bpm):  56 Weight(lbs): 179 Blood Pressure 148/80 (mmHg): Body Mass Index(BMI): 26 Temperature(F): 97.5 Respiratory Rate 20 (breaths/min): Photos: [1:No Photos] [2:No Photos] [N/A:N/A] Wound Location: [1:Left Lower Leg - Anterior] [2:Left, Medial Lower Leg] [N/A:N/A] Wounding Event: [1:Trauma] [2:Blister] [N/A:N/A] Primary Etiology: [1:Diabetic Wound/Ulcer of the Lower Extremity] [2:Diabetic Wound/Ulcer of the Lower Extremity] [N/A:N/A] Comorbid History: [1:Cataracts, Asthma, Sleep Apnea, Congestive Heart Failure, Coronary Artery Disease, Hypertension, Type II Diabetes, End Stage Renal Disease, Gout] [2:Cataracts, Asthma, Sleep Apnea, Congestive Heart Failure, Coronary Artery Disease,  Hypertension, Type II Diabetes, End Stage Renal Disease, Gout] [N/A:N/A] Date Acquired: [1:10/11/2015] [2:05/20/2016] [N/A:N/A] Weeks of Treatment: [1:1] [2:1] [N/A:N/A] Wound Status: [1:Open] [2:Open] [N/A:N/A] Measurements L x W x D 2.5x1x0.1 [2:1.3x1.3x0.1] [N/A:N/A] (cm)  Area (cm) : [1:1.963] [2:1.327] [N/A:N/A] Volume (cm) : [1:0.196] [2:0.133] [N/A:N/A] % Reduction in Area: [1:47.90%] [2:79.10%] [N/A:N/A] % Reduction in Volume: 48.00% [2:79.10%] [N/A:N/A] Classification: [1:Grade 2] [2:Grade 2] [N/A:N/A] Exudate Amount: [1:None Present] [2:Large] [N/A:N/A] Exudate Type: [1:N/A] [2:Serosanguineous] [N/A:N/A] Exudate Color: [1:N/A] [2:red, brown] [N/A:N/A] Wound Margin: [1:Distinct, outline attached] [2:Distinct, outline attached] [N/A:N/A] Granulation Amount: [1:None Present (0%)] [2:Large (67-100%)] [N/A:N/A] Granulation Quality: [1:N/A] [2:Red] [N/A:N/A] Necrotic Amount: [1:Large (67-100%)] [2:Small (1-33%)] [N/A:N/A] Necrotic Tissue: [1:Eschar] [2:Adherent Slough] [N/A:N/A] Exposed Structures: Fascia: No Fascia: No N/A Fat Layer (Subcutaneous Fat Layer (Subcutaneous Tissue) Exposed: No Tissue) Exposed: No Tendon: No Tendon: No Muscle: No Muscle: No Joint: No Joint: No Bone: No Bone:  No Limited to Skin Limited to Skin Breakdown Breakdown Epithelialization: None None N/A Debridement: Debridement (38756- Debridement (43329- N/A 11047) 11047) Pre-procedure 11:18 11:18 N/A Verification/Time Out Taken: Pain Control: Lidocaine 4% Topical Lidocaine 4% Topical N/A Solution Solution Tissue Debrided: Fibrin/Slough, Exudates, Fibrin/Slough, Exudates, N/A Subcutaneous Subcutaneous Level: Skin/Subcutaneous Skin/Subcutaneous N/A Tissue Tissue Debridement Area (sq 2.5 1.69 N/A cm): Instrument: Curette Curette N/A Bleeding: Minimum Minimum N/A Hemostasis Achieved: Pressure Pressure N/A Procedural Pain: 0 0 N/A Post Procedural Pain: 0 0 N/A Debridement Treatment Procedure was tolerated Procedure was tolerated N/A Response: well well Post Debridement 2.5x0.5x0.1 1.3x1.3x0.1 N/A Measurements L x W x D (cm) Post Debridement 0.098 0.133 N/A Volume: (cm) Periwound Skin Texture: No Abnormalities Noted No Abnormalities Noted N/A Periwound Skin Dry/Scaly: Yes Dry/Scaly: Yes N/A Moisture: Periwound Skin Color: No Abnormalities Noted No Abnormalities Noted N/A Temperature: No Abnormality No Abnormality N/A Tenderness on Yes Yes N/A Palpation: Wound Preparation: Ulcer Cleansing: Ulcer Cleansing: N/A Rinsed/Irrigated with Rinsed/Irrigated with Saline Saline Topical Anesthetic Topical Anesthetic Applied: Other: lidocaine Applied: Other: lidocaine 4% 4% Procedures Performed: Debridement Debridement N/A [name], [name] ([id number]) Treatment Notes Electronic Signature(s) Signed: 06/10/2016 11:32:14 AM By: Christin Fudge MD, FACS Entered By: Christin Fudge on 06/10/2016 11:32:14 [name], [name] ([id number]) -------------------------------------------------------------------------------- [place] Details Patient Name: [name], [name]. Date of Service: 06/10/2016 11:00 AM Medical Record Number: [id number] Patient Account Number: [id number] Date of  Birth/Sex: 1960-05-13 (56 y.o. Male) Treating RN: Ahmed Prima Primary Care Kashmere Staffa: [name] Other Clinician: Referring Pooja Camuso: [name] Treating Samariyah Cowles/Extender: Frann Rider in Treatment: 1 Active Inactive ` Nutrition Nursing Diagnoses: Imbalanced nutrition Impaired glucose control: actual or potential Goals: Patient/caregiver agrees to and verbalizes understanding of need to use nutritional supplements and/or vitamins as prescribed Date Initiated: 06/03/2016 Target Resolution Date: 09/28/2016 Goal Status: Active Interventions: Assess patient nutrition upon admission and as needed per policy Notes: ` Orientation to the Wound Care Program Nursing Diagnoses: Knowledge deficit related to the wound healing center program Goals: Patient/caregiver will verbalize understanding of the [place] Date Initiated: 06/03/2016 Target Resolution Date: 06/29/2016 Goal Status: Active Interventions: Provide education on orientation to the wound center Notes: ` Pain, Acute or Chronic Nursing Diagnoses: [name], [name] ([id number]) Pain, acute or chronic: actual or potential Potential alteration in comfort, pain Goals: Patient/caregiver will verbalize adequate pain control between visits Date Initiated: 06/03/2016 Target Resolution Date: 08/31/2016 Goal Status: Active Interventions: Assess comfort goal upon admission Complete pain assessment as per visit requirements Notes: ` Wound/Skin Impairment Nursing Diagnoses: Impaired tissue integrity Knowledge deficit related to ulceration/compromised skin integrity Goals: Ulcer/skin breakdown will have a volume reduction of 80% by week 12 Date Initiated: 06/03/2016 Target Resolution Date: 09/21/2016 Goal Status: Active Interventions: Assess patient/caregiver ability to perform ulcer/skin care regimen upon admission and as needed Assess ulceration(s)  every visit Notes: Electronic  Signature(s) Signed: 06/10/2016 4:38:07 PM By: Alric Quan Entered By: Alric Quan on 06/10/2016 11:09:44 [name] ([id number]) -------------------------------------------------------------------------------- Pain Assessment Details Patient Name: [name], SEK. Date of Service: 06/10/2016 11:00 AM Medical Record Number: [id number] Patient Account Number: [id number] Date of Birth/Sex: 1960-04-28 (56 y.o. Male) Treating RN: Ahmed Prima Primary Care Kely Dohn: [name] Other Clinician: Referring Yadriel Kerrigan: [name] Treating Banyan Goodchild/Extender: Frann Rider in Treatment: 1 Active Problems Location of Pain Severity and Description of Pain Patient Has Paino No Site Locations With Dressing Change: No Pain Management and Medication Current Pain Management: Electronic Signature(s) Signed: 06/10/2016 4:38:07 PM By: Alric Quan Entered By: Alric Quan on 06/10/2016 11:00:26 [name] ([id number]) -------------------------------------------------------------------------------- Patient/Caregiver Education Details Patient Name: [name]. Date of Service: 06/10/2016 11:00 AM Medical Record Number: [id number] Patient Account Number: [id number] Date of Birth/Gender: 1960-09-08 (56 y.o. Male) Treating RN: Ahmed Prima Primary Care Physician: [name] Other Clinician: Referring Physician: [name] Treating Physician/Extender: Frann Rider in Treatment: 1 Education Assessment Education Provided To: Patient Education Topics Provided Wound/Skin Impairment: Handouts: Other: change dressing as ordered Methods: Demonstration, Explain/Verbal Responses: State content correctly Electronic Signature(s) Signed: 06/10/2016 4:38:07 PM By: Alric Quan Entered By: Alric Quan on 06/10/2016 11:12:05 [name]  ([id number]) -------------------------------------------------------------------------------- Wound Assessment Details Patient Name: Carl Speller F. Date of Service: 06/10/2016 11:00 AM Medical Record Number: [id number] Patient Account Number: [id number] Date of Birth/Sex: March 30, 1960 (56 y.o. Male) Treating RN: Ahmed Prima Primary Care Tiffanie Blassingame: [name] Other Clinician: Referring Jaivion Kingsley: [name] Treating Shamell Hittle/Extender: Frann Rider in Treatment: 1 Wound Status Wound Number: 1 Primary Diabetic Wound/Ulcer of the Lower Etiology: Extremity Wound Location: Left Lower Leg - Anterior Wound Open Wounding Event: Trauma Status: Date Acquired: 10/11/2015 Comorbid Cataracts, Asthma, Sleep Apnea, Weeks Of Treatment: 1 History: Congestive Heart Failure, Coronary Clustered Wound: No Artery Disease, Hypertension, Type II Diabetes, End Stage Renal Disease, Gout Photos Photo Uploaded By: Alric Quan on 06/10/2016 11:45:03 Wound Measurements Length: (cm) 2.5 Width: (cm) 1 Depth: (cm) 0.1 Area: (cm) 1.963 Volume: (cm) 0.196 % Reduction in Area: 47.9% % Reduction in Volume: 48% Epithelialization: None Tunneling: No Undermining: No Wound Description Classification: Grade 2 Foul Odor Afte Wound Margin: Distinct, outline attached Slough/Fibrino Exudate Amount: None Present r Cleansing: No No Wound Bed Granulation Amount: None Present (0%) Exposed Structure Necrotic Amount: Large (67-100%) Fascia Exposed: No Necrotic Quality: Eschar Fat Layer (Subcutaneous Tissue) Exposed: No Tendon Exposed: No [name], [name] ([id number]) Muscle Exposed: No Joint Exposed: No Bone Exposed: No Limited to Skin Breakdown Periwound Skin Texture Texture Color No Abnormalities Noted: No No Abnormalities Noted: No Moisture Temperature / Pain No Abnormalities Noted: No Temperature: No Abnormality Dry / Scaly: Yes Tenderness on Palpation: Yes Wound  Preparation Ulcer Cleansing: Rinsed/Irrigated with Saline Topical Anesthetic Applied: Other: lidocaine 4%, Treatment Notes Wound #1 (Left, Anterior Lower Leg) 1. Cleansed with: Clean wound with Normal Saline 2. Anesthetic Topical Lidocaine 4% cream to wound bed prior to debridement 4. Dressing Applied: Aquacel Ag 5. Secondary Dressing Applied ABD Pad 7. Secured with Tape 3 Layer Compression System - Left Lower Extremity Notes unna to anchor Electronic Signature(s) Signed: 06/10/2016 4:38:07 PM By: Alric Quan Entered By: Alric Quan on 06/10/2016 11:07:05 [name] ([id number]) -------------------------------------------------------------------------------- Wound Assessment Details Patient Name: Carl Speller F. Date of Service: 06/10/2016 11:00 AM Medical Record Number: [id number] Patient Account Number: [id number] Date of Birth/Sex: September 15, 1960 (56 y.o. Male) Treating RN: Ahmed Prima Primary Care Ashwath Lasch: Silvio Pate,  Richard Other Clinician: Referring Keawe Marcello: [name] Treating Zymere Patlan/Extender: Frann Rider in Treatment: 1 Wound Status Wound Number: 2 Primary Diabetic Wound/Ulcer of the Lower Etiology: Extremity Wound Location: Left, Medial Lower Leg Wound Open Wounding Event: Blister Status: Date Acquired: 05/20/2016 Comorbid Cataracts, Asthma, Sleep Apnea, Weeks Of Treatment: 1 History: Congestive Heart Failure, Coronary Clustered Wound: No Artery Disease, Hypertension, Type II Diabetes, End Stage Renal Disease, Gout Photos Photo Uploaded By: Alric Quan on 06/10/2016 11:45:04 Wound Measurements Length: (cm) 1.3 Width: (cm) 1.3 Depth: (cm) 0.1 Area: (cm) 1.327 Volume: (cm) 0.133 % Reduction in Area: 79.1% % Reduction in Volume: 79.1% Epithelialization: None Tunneling: No Undermining: No Wound Description Classification: Grade 2 Foul Odor Aft Wound Margin: Distinct, outline attached Slough/Fibrin Exudate Amount:  Large Exudate Type: Serosanguineous Exudate Color: red, brown er Cleansing: No o No Wound Bed Granulation Amount: Large (67-100%) Exposed Structure Granulation Quality: Red Fascia Exposed: No [name], [name] ([id number]) Necrotic Amount: Small (1-33%) Fat Layer (Subcutaneous Tissue) Exposed: No Necrotic Quality: Adherent Slough Tendon Exposed: No Muscle Exposed: No Joint Exposed: No Bone Exposed: No Limited to Skin Breakdown Periwound Skin Texture Texture Color No Abnormalities Noted: No No Abnormalities Noted: No Moisture Temperature / Pain No Abnormalities Noted: No Temperature: No Abnormality Dry / Scaly: Yes Tenderness on Palpation: Yes Wound Preparation Ulcer Cleansing: Rinsed/Irrigated with Saline Topical Anesthetic Applied: Other: lidocaine 4%, Treatment Notes Wound #2 (Left, Medial Lower Leg) 1. Cleansed with: Clean wound with Normal Saline 2. Anesthetic Topical Lidocaine 4% cream to wound bed prior to debridement 4. Dressing Applied: Aquacel Ag 5. Secondary Dressing Applied ABD Pad 7. Secured with Tape 3 Layer Compression System - Left Lower Extremity Notes unna to anchor Electronic Signature(s) Signed: 06/10/2016 4:38:07 PM By: Alric Quan Entered By: Alric Quan on 06/10/2016 11:26:47 [name], [name] ([id number]) -------------------------------------------------------------------------------- Vitals Details Patient Name: [name]. Date of Service: 06/10/2016 11:00 AM Medical Record Number: [id number] Patient Account Number: [id number] Date of Birth/Sex: [birth year] (56 y.o. Male) Treating RN: Ahmed Prima Primary Care Amiri Riechers: [name] Other Clinician: Referring Rayna Brenner: [name] Treating Chani Ghanem/Extender: Frann Rider in Treatment: 1 Vital Signs Time Taken: 11:00 Temperature (F): 97.5 Height (in): 70 Pulse (bpm): 56 Weight (lbs): 179 Respiratory Rate (breaths/min): 20 Body Mass Index (BMI):  25.7 Blood Pressure (mmHg): 148/80 Reference Range: 80 - 120 mg / dl Electronic Signature(s) Signed: 06/10/2016 4:38:07 PM By: Alric Quan Entered By: Alric Quan on 06/10/2016 11:01:42

## 2016-06-18 ENCOUNTER — Other Ambulatory Visit: Payer: Self-pay

## 2016-06-18 ENCOUNTER — Telehealth: Payer: Self-pay

## 2016-06-18 VITALS — BP 140/60 | HR 75 | Ht 71.0 in | Wt 283.2 lb

## 2016-06-18 DIAGNOSIS — E118 Type 2 diabetes mellitus with unspecified complications: Secondary | ICD-10-CM

## 2016-06-18 DIAGNOSIS — Z794 Long term (current) use of insulin: Secondary | ICD-10-CM

## 2016-06-18 NOTE — Patient Outreach (Signed)
[facility]) Care Management  [place]  06/18/2016   [name] May 05, 1960 [id number]  Subjective: Follow up appointment with patient.  He has been chronically unwell since the last time I saw him in late November- he currently c/o feeling down, lethargic, scared and generally unwell.  He has a cold and frequently coughed during our appointment.  He said he has missed a great deal of work over the past couple of weeks and has been unable to exercise x 1 month.  He admits to being down and depressed and wonders if he needs to be on additional anti depression medications. He complains of occasional SOB and wonders how much excess weight he is carrying. He complains of no pain.  His meter indicates a 14 day blood sugar average of 123m/dl with fasting blood sugars 94- 1668mdl.  He complains of occasional low blood sugars through the night- he treats them with a glass of juice followed by a package of peanut butter crackers. He does not check post prandial blood sugars and denies symptoms of hyperglycemia.   He has been to multiple MD visits over the past few months.  He had a new water blister develop near his left shin wound. He has seen vascular to assess and it has been determined that he has vascular insufficiency and may need to have surgery- "I'm scared about my legs". He had the flu in February.   Objective:  Vitals:   06/18/16 0842  BP: 140/60  Pulse: 75  SpO2: 95%  Weight: 283 lb 3.2 oz (128.5 kg)  Height: 1.803 m (_0 )     Encounter Medications:  Outpatient Encounter Prescriptions as of 06/18/2016  Medication Sig Note  . amLODipine (NORVASC) 5 MG tablet TAKE 1 TABLET BY MOUTH DAILY   . beclomethasone (QVAR) 80 MCG/ACT inhaler Inhale 2 puffs into the lungs 2 (two) times daily.   . Marland KitchenuPROPion (WELLBUTRIN) 75 MG tablet Take 1 tablet (75 mg total) by mouth 2 (two) times daily.   . Marland KitchenIALIS 20 MG tablet TAKE 1/2 TO 1 TABLET BY MOUTH EVERY OTHER DAY AS  NEEDED   . ELIQUIS 5 MG TABS tablet TAKE 1 TABLET BY MOUTH 2 TIMES DAILY.   . furosemide (LASIX) 40 MG tablet Take 40 mg by mouth 2 (two) times daily. Per Renal Doctor   . HYDROcodone-homatropine (HYCODAN) 5-1.5 MG/5ML syrup Take 5 mLs by mouth every 8 (eight) hours as needed for cough.   . insulin aspart (NOVOLOG FLEXPEN) 100 UNIT/ML FlexPen Inject 5 Units into the skin 3 (three) times daily with meals as needed for high blood sugar. Pt uses as needed per sliding scale. Patient is taking 0-5 units tid   . insulin glargine (LANTUS) 100 unit/mL SOPN Inject 20 Units into the skin at bedtime.    . Marland Kitchenisinopril (PRINIVIL,ZESTRIL) 40 MG tablet TAKE 1 TABLET BY MOUTH DAILY   . metolazone (ZAROXOLYN) 2.5 MG tablet Take 2.5 mg by mouth every Monday, Wednesday, and Friday.  10/12/2015: Takes as needed. Last dose last Thursday or Friday.  . mometasone (NASONEX) 50 MCG/ACT nasal spray Place 2 sprays into both nostrils at bedtime.   . Multiple Vitamin (MULTIVITAMIN WITH MINERALS) TABS tablet Take 1 tablet by mouth daily.   . mycophenolate (MYFORTIC) 180 MG EC tablet Take 540 mg by mouth 2 (two) times daily.    . Marland Kitchenmeprazole (PRILOSEC) 20 MG capsule TAKE 1 CAPSULE BY MOUTH ONCE DAILY   . tacrolimus (PROGRAF) 0.5 MG capsule Take  0.5 mg by mouth 2 (two) times daily. Three tabs qam, 2 tabs q evening   . gabapentin (NEURONTIN) 300 MG capsule Take 300 mg by mouth 3 (three) times daily.   . mometasone (NASONEX) 50 MCG/ACT nasal spray PLACE 2 SPRAYS INTO THE NOSE AT BEDTIME. (Patient not taking: Reported on 06/18/2016)   . potassium chloride (K-DUR,KLOR-CON) 10 MEQ tablet Take 40 mEq by mouth daily.    . rosuvastatin (CRESTOR) 10 MG tablet Take 1 tablet (10 mg total) by mouth daily. (Patient taking differently: Take 10 mg by mouth daily. taking)    No facility-administered encounter medications on file as of 06/18/2016.     Functional Status:  In your present state of health, do you have any difficulty performing the  following activities: 02/26/2016 10/12/2015  Hearing? N N  Vision? Y N  Difficulty concentrating or making decisions? N N  Walking or climbing stairs? N N  Dressing or bathing? N N  Doing errands, shopping? N N  Some recent data might be hidden    Fall/Depression Screening: PHQ 2/9 Scores 06/18/2016 02/26/2016 01/17/2016 01/17/2016 11/29/2015 10/10/2015 07/18/2015  PHQ - 2 Score 4 1 0 0 0 2 0  PHQ- 9 Score 22 - - - - - -    Assessment: Somnang is engaged with his health and attends wound clinic and MD visits as scheduled.  He does not have a follow up appointment scheduled with Dr. Gabriel Carina (diabetes). He is feeling down because his recent illnesses, the new open area on his leg, and the potential for vascular surgery.    He did not have a current medication list with him- I have asked him to keep one with him at all times and to update it regularly.  He is not 100% sure of the mg of the medications he is taking.   Plan:  [facility] CM Care Plan Problem Two     Most Recent Value  Care Plan Problem Two  Potential for elevated A1C > 7%  Role Documenting the Problem Two  Care Management Coordinator  Care Plan for Problem Two  Active  Interventions for Problem Two Long Term Goal   Discussed sick day care, treament of hypoglycemia, to discuss current feeling of depression with MD next Friday, June 27, 2016.  Encouraged to speak to D about weight (?fluid) . Encouraged to document daily weight and to bring it to his next MD visit  [place] Goal (31-90) days  Patient will maintain A1C less than 7%  [facility] Long Term Goal Start Date  06/18/16     I will follow up with Tc- the timing will be based on the A1C outcome. He is to email me the result so we can schedule a follow up.    [name], RN, BA, [place], [place] Direct Dial:  [phone]  Fax:  [phone] E-mail: [name] [address], [place],    [address]

## 2016-06-18 NOTE — Telephone Encounter (Signed)
Tried to call pt. VM was not set up

## 2016-06-18 NOTE — Telephone Encounter (Signed)
[name], MD  Pilar Grammes, CMA        Please check on him later----phone busy x 2 for me.  Checking after got note from Julie---she thought he was down some with the leg problems. Make sure he knows that the vascular problem with legs is the veins, not arteries (so we are not worried that he will lose his leg, the procedure is to help reduce swelling).

## 2016-06-19 NOTE — Telephone Encounter (Signed)
Tried to call pt, again. VM is full.

## 2016-06-20 ENCOUNTER — Encounter: Payer: 59 | Admitting: Surgery

## 2016-06-20 DIAGNOSIS — L97222 Non-pressure chronic ulcer of left calf with fat layer exposed: Secondary | ICD-10-CM | POA: Diagnosis not present

## 2016-06-20 DIAGNOSIS — I4891 Unspecified atrial fibrillation: Secondary | ICD-10-CM | POA: Diagnosis not present

## 2016-06-20 DIAGNOSIS — I132 Hypertensive heart and chronic kidney disease with heart failure and with stage 5 chronic kidney disease, or end stage renal disease: Secondary | ICD-10-CM | POA: Diagnosis not present

## 2016-06-20 DIAGNOSIS — I890 Lymphedema, not elsewhere classified: Secondary | ICD-10-CM | POA: Diagnosis not present

## 2016-06-20 DIAGNOSIS — I2510 Atherosclerotic heart disease of native coronary artery without angina pectoris: Secondary | ICD-10-CM | POA: Diagnosis not present

## 2016-06-20 DIAGNOSIS — I87312 Chronic venous hypertension (idiopathic) with ulcer of left lower extremity: Secondary | ICD-10-CM | POA: Diagnosis not present

## 2016-06-20 DIAGNOSIS — I509 Heart failure, unspecified: Secondary | ICD-10-CM | POA: Diagnosis not present

## 2016-06-20 DIAGNOSIS — L97822 Non-pressure chronic ulcer of other part of left lower leg with fat layer exposed: Secondary | ICD-10-CM | POA: Diagnosis not present

## 2016-06-20 DIAGNOSIS — Z940 Kidney transplant status: Secondary | ICD-10-CM | POA: Diagnosis not present

## 2016-06-20 DIAGNOSIS — E11622 Type 2 diabetes mellitus with other skin ulcer: Secondary | ICD-10-CM | POA: Diagnosis not present

## 2016-06-20 DIAGNOSIS — I872 Venous insufficiency (chronic) (peripheral): Secondary | ICD-10-CM | POA: Diagnosis not present

## 2016-06-21 ENCOUNTER — Other Ambulatory Visit: Payer: Self-pay | Admitting: Cardiovascular Disease

## 2016-06-21 NOTE — Progress Notes (Signed)
NORM, WRAY ([id number]) Visit Report for 06/20/2016 Arrival Information Details Patient Name: [name], [name]. Date of Service: 06/20/2016 8:00 AM Medical Record Number: [id number] Patient Account Number: [id number] Date of Birth/Sex: [birth year] (56 y.o. Male) Treating RN: Ahmed Prima Primary Care Terrin Meddaugh: [name] Other Clinician: Referring Eligha Kmetz: [name] Treating Miri Jose/Extender: Frann Rider in Treatment: 2 Visit Information History Since Last Visit All ordered tests and consults were completed: No Patient Arrived: Ambulatory Added or deleted any medications: No Arrival Time: 08:03 Any new allergies or adverse reactions: No Accompanied By: self Had a fall or experienced change in No Transfer Assistance: None activities of daily living that may affect Patient Identification Verified: Yes risk of falls: Secondary Verification Process Yes Signs or symptoms of abuse/neglect since last No Completed: visito Patient Requires Transmission-Based No Hospitalized since last visit: No Precautions: Has Dressing in Place as Prescribed: Yes Patient Has Alerts: Yes Has Compression in Place as Prescribed: Yes Patient Alerts: DM II Pain Present Now: No Eliquis Electronic Signature(s) Signed: 06/20/2016 5:02:31 PM By: Alric Quan Entered By: Alric Quan on 06/20/2016 08:05:13 [name] ([id number]) -------------------------------------------------------------------------------- Encounter Discharge Information Details Patient Name: [name]. Date of Service: 06/20/2016 8:00 AM Medical Record Number: [id number] Patient Account Number: [id number] Date of Birth/Sex: 1960/04/05 (56 y.o. Male) Treating RN: Ahmed Prima Primary Care Lin Glazier: [name] Other Clinician: Referring Ricky Doan: [name] Treating Moani Weipert/Extender: Frann Rider in Treatment: 2 Encounter Discharge Information Items Discharge Pain  Level: 0 Discharge Condition: Stable Ambulatory Status: Ambulatory Discharge Destination: Home Transportation: Private Auto Accompanied By: self Schedule Follow-up Appointment: Yes Medication Reconciliation completed and provided to Patient/Care No Taron Conrey: Provided on Clinical Summary of Care: 06/20/2016 Form Type Recipient Paper Patient JG Electronic Signature(s) Signed: 06/20/2016 8:48:59 AM By: Ruthine Dose Entered By: Ruthine Dose on 06/20/2016 08:48:59 Doane, Dorothea Glassman ([id number]) -------------------------------------------------------------------------------- Lower Extremity Assessment Details Patient Name: Carl Speller F. Date of Service: 06/20/2016 8:00 AM Medical Record Number: [id number] Patient Account Number: [id number] Date of Birth/Sex: March 06, 1961 (56 y.o. Male) Treating RN: Ahmed Prima Primary Care Eldor Conaway: [name] Other Clinician: Referring Kenyatta Keidel: [name] Treating Damarcus Reggio/Extender: Frann Rider in Treatment: 2 Electronic Signature(s) Signed: 06/20/2016 5:02:31 PM By: Alric Quan Entered By: Alric Quan on 06/20/2016 08:18:45 Virtue, Dorothea Glassman ([id number]) -------------------------------------------------------------------------------- Multi Wound Chart Details Patient Name: [name]. Date of Service: 06/20/2016 8:00 AM Medical Record Number: [id number] Patient Account Number: [id number] Date of Birth/Sex: March 20, 1961 (56 y.o. Male) Treating RN: Ahmed Prima Primary Care Jozie Wulf: [name] Other Clinician: Referring Muriel Wilber: [name] Treating Aquilla Shambley/Extender: Frann Rider in Treatment: 2 Vital Signs Height(in): 70 Pulse(bpm): 59 Weight(lbs): 179 Blood Pressure 153/72 (mmHg): Body Mass Index(BMI): 26 Temperature(F): 97.6 Respiratory Rate 20 (breaths/min): Photos: [1:No Photos] [2:No Photos] [N/A:N/A] Wound Location: [1:Left Lower Leg - Anterior] [2:Left Lower Leg - Medial]  [N/A:N/A] Wounding Event: [1:Trauma] [2:Blister] [N/A:N/A] Primary Etiology: [1:Diabetic Wound/Ulcer of the Lower Extremity] [2:Diabetic Wound/Ulcer of the Lower Extremity] [N/A:N/A] Comorbid History: [1:Cataracts, Asthma, Sleep Apnea, Congestive Heart Failure, Coronary Artery Disease, Hypertension, Type II Diabetes, End Stage Renal Disease, Gout] [2:Cataracts, Asthma, Sleep Apnea, Congestive Heart Failure, Coronary Artery Disease,  Hypertension, Type II Diabetes, End Stage Renal Disease, Gout] [N/A:N/A] Date Acquired: [1:10/11/2015] [2:05/20/2016] [N/A:N/A] Weeks of Treatment: [1:2] [2:2] [N/A:N/A] Wound Status: [1:Open] [2:Open] [N/A:N/A] Measurements L x W x D 2.3x1x0.1 [2:1.3x1.3x0.1] [N/A:N/A] (cm) Area (cm) : [1:1.806] [2:1.327] [N/A:N/A] Volume (cm) : [1:0.181] [2:0.133] [N/A:N/A] % Reduction in Area: [1:52.10%] [2:79.10%] [N/A:N/A] % Reduction in Volume: 52.00% [2:79.10%] [N/A:N/A]  Classification: [1:Grade 2] [2:Grade 2] [N/A:N/A] Exudate Amount: [1:None Present] [2:None Present] [N/A:N/A] Wound Margin: [1:Distinct, outline attached] [2:Distinct, outline attached] [N/A:N/A] Granulation Amount: [1:None Present (0%)] [2:None Present (0%)] [N/A:N/A] Necrotic Amount: [1:Large (67-100%)] [2:Large (67-100%)] [N/A:N/A] Necrotic Tissue: [1:Eschar] [2:Eschar] [N/A:N/A] Exposed Structures: [1:Fascia: No Fat Layer (Subcutaneous Tissue) Exposed: No] [2:Fascia: No Fat Layer (Subcutaneous Tissue) Exposed: No] [N/A:N/A] Tendon: No Tendon: No Muscle: No Muscle: No Joint: No Joint: No Bone: No Bone: No Limited to Skin Limited to Skin Breakdown Breakdown Epithelialization: None None N/A Debridement: Debridement ([id number] Debridement ([id number]- N/A 11047) 11047) Pre-procedure 08:22 08:22 N/A Verification/Time Out Taken: Pain Control: Lidocaine 4% Topical Lidocaine 4% Topical N/A Solution Solution Tissue Debrided: Fibrin/Slough, Exudates, Fibrin/Slough, Exudates, N/A Subcutaneous  Subcutaneous Level: Skin/Subcutaneous Skin/Subcutaneous N/A Tissue Tissue Debridement Area (sq 2.3 1.69 N/A cm): Instrument: Forceps Forceps N/A Bleeding: Minimum Minimum N/A Hemostasis Achieved: Pressure Pressure N/A Procedural Pain: 0 0 N/A Post Procedural Pain: 0 0 N/A Debridement Treatment Procedure was tolerated Procedure was tolerated N/A Response: well well Post Debridement 0.8x0.5x0.1 1x0.4x0.1 N/A Measurements L x W x D (cm) Post Debridement 0.031 0.031 N/A Volume: (cm) Periwound Skin Texture: No Abnormalities Noted No Abnormalities Noted N/A Periwound Skin Dry/Scaly: Yes Dry/Scaly: Yes N/A Moisture: Periwound Skin Color: No Abnormalities Noted No Abnormalities Noted N/A Temperature: No Abnormality No Abnormality N/A Tenderness on Yes Yes N/A Palpation: Wound Preparation: Ulcer Cleansing: Ulcer Cleansing: N/A Rinsed/Irrigated with Rinsed/Irrigated with Saline Saline Topical Anesthetic Topical Anesthetic Applied: Other: lidocaine Applied: Other: lidocaine 4% 4% Procedures Performed: Debridement Debridement N/A Treatment Notes TAKUMI, DIN F. ([id number]) Wound #1 (Left, Anterior Lower Leg) 1. Cleansed with: Clean wound with Normal Saline Cleanse wound with antibacterial soap and water 2. Anesthetic Topical Lidocaine 4% cream to wound bed prior to debridement 4. Dressing Applied: Prisma Ag 5. Secondary Dressing Applied ABD Pad Dry Gauze 7. Secured with Tape 4-Layer Compression System - Left Lower Extremity Notes unna to anchor Wound #2 (Left, Medial Lower Leg) 1. Cleansed with: Clean wound with Normal Saline Cleanse wound with antibacterial soap and water 2. Anesthetic Topical Lidocaine 4% cream to wound bed prior to debridement 4. Dressing Applied: Prisma Ag 5. Secondary Dressing Applied ABD Pad Dry Gauze 7. Secured with Tape 4-Layer Compression System - Left Lower Extremity Notes unna to anchor Electronic Signature(s) Signed: 06/20/2016  8:48:07 AM By: Christin Fudge MD, FACS Entered By: Christin Fudge on 06/20/2016 08:48:07 BRIXON, ZHEN ([id number]) -------------------------------------------------------------------------------- Dillon Details Patient Name: ORIE, BAXENDALE. Date of Service: 06/20/2016 8:00 AM Medical Record Number: [id number] Patient Account Number: [id number] Date of Birth/Sex: November 14, 1960 (56 y.o. Male) Treating RN: Ahmed Prima Primary Care Lasha Echeverria: [name] Other Clinician: Referring Helyn Schwan: [name] Treating Berta Denson/Extender: Frann Rider in Treatment: 2 Active Inactive ` Nutrition Nursing Diagnoses: Imbalanced nutrition Impaired glucose control: actual or potential Goals: Patient/caregiver agrees to and verbalizes understanding of need to use nutritional supplements and/or vitamins as prescribed Date Initiated: 06/03/2016 Target Resolution Date: 09/28/2016 Goal Status: Active Interventions: Assess patient nutrition upon admission and as needed per policy Notes: ` Orientation to the Wound Care Program Nursing Diagnoses: Knowledge deficit related to the wound healing center program Goals: Patient/caregiver will verbalize understanding of the [place] Date Initiated: 06/03/2016 Target Resolution Date: 06/29/2016 Goal Status: Active Interventions: Provide education on orientation to the wound center Notes: ` Pain, Acute or Chronic Nursing Diagnoses: BENTLEE, BENNINGFIELD. ([id number]) Pain, acute or chronic: actual or potential Potential alteration in comfort, pain Goals: Patient/caregiver will verbalize adequate pain  control between visits Date Initiated: 06/03/2016 Target Resolution Date: 08/31/2016 Goal Status: Active Interventions: Assess comfort goal upon admission Complete pain assessment as per visit requirements Notes: ` Wound/Skin Impairment Nursing Diagnoses: Impaired tissue integrity Knowledge deficit related  to ulceration/compromised skin integrity Goals: Ulcer/skin breakdown will have a volume reduction of 80% by week 12 Date Initiated: 06/03/2016 Target Resolution Date: 09/21/2016 Goal Status: Active Interventions: Assess patient/caregiver ability to perform ulcer/skin care regimen upon admission and as needed Assess ulceration(s) every visit Notes: Electronic Signature(s) Signed: 06/20/2016 5:02:31 PM By: Alric Quan Entered By: Alric Quan on 06/20/2016 08:18:49 Vandevelde, Dorothea Glassman ([id number]) -------------------------------------------------------------------------------- Pain Assessment Details Patient Name: [name]. Date of Service: 06/20/2016 8:00 AM Medical Record Number: [id number] Patient Account Number: [id number] Date of Birth/Sex: Jul 27, 1960 (56 y.o. Male) Treating RN: Ahmed Prima Primary Care Yarimar Lavis: [name] Other Clinician: Referring Jamaia Brum: [name] Treating Saabir Blyth/Extender: Frann Rider in Treatment: 2 Active Problems Location of Pain Severity and Description of Pain Patient Has Paino No Site Locations With Dressing Change: No Pain Management and Medication Current Pain Management: Electronic Signature(s) Signed: 06/20/2016 5:02:31 PM By: Alric Quan Entered By: Alric Quan on 06/20/2016 08:05:22 GARCIADorothea Glassman ([id number]) -------------------------------------------------------------------------------- Patient/Caregiver Education Details Patient Name: [name]. Date of Service: 06/20/2016 8:00 AM Medical Record Number: [id number] Patient Account Number: [id number] Date of Birth/Gender: 03-31-1960 (56 y.o. Male) Treating RN: Ahmed Prima Primary Care Physician: [name] Other Clinician: Referring Physician: [name] Treating Physician/Extender: Frann Rider in Treatment: 2 Education Assessment Education Provided To: Patient Education Topics Provided Wound/Skin  Impairment: Handouts: Other: change dressing as ordered Methods: Demonstration, Explain/Verbal Responses: State content correctly Electronic Signature(s) Signed: 06/20/2016 5:02:31 PM By: Alric Quan Entered By: Alric Quan on 06/20/2016 08:31:26 Caffee, Dorothea Glassman ([id number]) -------------------------------------------------------------------------------- Wound Assessment Details Patient Name: Carl Speller F. Date of Service: 06/20/2016 8:00 AM Medical Record Number: [id number] Patient Account Number: [id number] Date of Birth/Sex: April 04, 1960 (56 y.o. Male) Treating RN: Ahmed Prima Primary Care Donley Harland: [name] Other Clinician: Referring Aneya Daddona: [name] Treating Manaal Mandala/Extender: Frann Rider in Treatment: 2 Wound Status Wound Number: 1 Primary Diabetic Wound/Ulcer of the Lower Etiology: Extremity Wound Location: Left Lower Leg - Anterior Wound Open Wounding Event: Trauma Status: Date Acquired: 10/11/2015 Comorbid Cataracts, Asthma, Sleep Apnea, Weeks Of Treatment: 2 History: Congestive Heart Failure, Coronary Clustered Wound: No Artery Disease, Hypertension, Type II Diabetes, End Stage Renal Disease, Gout Photos Photo Uploaded By: Alric Quan on 06/20/2016 15:41:47 Wound Measurements Length: (cm) 2.3 Width: (cm) 1 Depth: (cm) 0.1 Area: (cm) 1.806 Volume: (cm) 0.181 % Reduction in Area: 52.1% % Reduction in Volume: 52% Epithelialization: None Tunneling: No Undermining: No Wound Description Classification: Grade 2 Foul Odor Afte Wound Margin: Distinct, outline attached Slough/Fibrino Exudate Amount: None Present r Cleansing: No No Wound Bed Granulation Amount: None Present (0%) Exposed Structure Necrotic Amount: Large (67-100%) Fascia Exposed: No Necrotic Quality: Eschar Fat Layer (Subcutaneous Tissue) Exposed: No Tendon Exposed: No Harshbarger, Darly F. ([id number]) Muscle Exposed: No Joint Exposed: No Bone  Exposed: No Limited to Skin Breakdown Periwound Skin Texture Texture Color No Abnormalities Noted: No No Abnormalities Noted: No Moisture Temperature / Pain No Abnormalities Noted: No Temperature: No Abnormality Dry / Scaly: Yes Tenderness on Palpation: Yes Wound Preparation Ulcer Cleansing: Rinsed/Irrigated with Saline Topical Anesthetic Applied: Other: lidocaine 4%, Treatment Notes Wound #1 (Left, Anterior Lower Leg) 1. Cleansed with: Clean wound with Normal Saline Cleanse wound with antibacterial soap and water 2. Anesthetic Topical Lidocaine 4% cream to wound bed  prior to debridement 4. Dressing Applied: Prisma Ag 5. Secondary Dressing Applied ABD Pad Dry Gauze 7. Secured with Tape 4-Layer Compression System - Left Lower Extremity Notes unna to anchor Electronic Signature(s) Signed: 06/20/2016 5:02:31 PM By: Alric Quan Entered By: Alric Quan on 06/20/2016 08:18:12 TARREN, VELARDI ([id number]) -------------------------------------------------------------------------------- Wound Assessment Details Patient Name: Carl Speller F. Date of Service: 06/20/2016 8:00 AM Medical Record Number: [id number] Patient Account Number: [id number] Date of Birth/Sex: 1960/11/05 (56 y.o. Male) Treating RN: Ahmed Prima Primary Care Dina Warbington: [name] Other Clinician: Referring Keyasia Jolliff: [name] Treating Chenoah Mcnally/Extender: Frann Rider in Treatment: 2 Wound Status Wound Number: 2 Primary Diabetic Wound/Ulcer of the Lower Etiology: Extremity Wound Location: Left Lower Leg - Medial Wound Open Wounding Event: Blister Status: Date Acquired: 05/20/2016 Comorbid Cataracts, Asthma, Sleep Apnea, Weeks Of Treatment: 2 History: Congestive Heart Failure, Coronary Clustered Wound: No Artery Disease, Hypertension, Type II Diabetes, End Stage Renal Disease, Gout Photos Photo Uploaded By: Alric Quan on 06/20/2016 15:41:48 Wound  Measurements Length: (cm) 1.3 Width: (cm) 1.3 Depth: (cm) 0.1 Area: (cm) 1.327 Volume: (cm) 0.133 % Reduction in Area: 79.1% % Reduction in Volume: 79.1% Epithelialization: None Tunneling: No Undermining: No Wound Description Classification: Grade 2 Foul Odor Afte Wound Margin: Distinct, outline attached Slough/Fibrino Exudate Amount: None Present r Cleansing: No No Wound Bed Granulation Amount: None Present (0%) Exposed Structure Necrotic Amount: Large (67-100%) Fascia Exposed: No Necrotic Quality: Eschar Fat Layer (Subcutaneous Tissue) Exposed: No Tendon Exposed: No Therrien, Diana F. ([id number]) Muscle Exposed: No Joint Exposed: No Bone Exposed: No Limited to Skin Breakdown Periwound Skin Texture Texture Color No Abnormalities Noted: No No Abnormalities Noted: No Moisture Temperature / Pain No Abnormalities Noted: No Temperature: No Abnormality Dry / Scaly: Yes Tenderness on Palpation: Yes Wound Preparation Ulcer Cleansing: Rinsed/Irrigated with Saline Topical Anesthetic Applied: Other: lidocaine 4%, Treatment Notes Wound #2 (Left, Medial Lower Leg) 1. Cleansed with: Clean wound with Normal Saline Cleanse wound with antibacterial soap and water 2. Anesthetic Topical Lidocaine 4% cream to wound bed prior to debridement 4. Dressing Applied: Prisma Ag 5. Secondary Dressing Applied ABD Pad Dry Gauze 7. Secured with Tape 4-Layer Compression System - Left Lower Extremity Notes unna to anchor Electronic Signature(s) Signed: 06/20/2016 5:02:31 PM By: Alric Quan Entered By: Alric Quan on 06/20/2016 08:18:38 Seibert, Dorothea Glassman ([id number]) -------------------------------------------------------------------------------- Vitals Details Patient Name: [name]. Date of Service: 06/20/2016 8:00 AM Medical Record Number: [id number] Patient Account Number: [id number] Date of Birth/Sex: 22-Mar-1961 (56 y.o. Male) Treating RN: Ahmed Prima Primary  Care Carlotta Telfair: [name] Other Clinician: Referring Shavone Nevers: [name] Treating Jomari Bartnik/Extender: Frann Rider in Treatment: 2 Vital Signs Time Taken: 08:05 Temperature (F): 97.6 Height (in): 70 Pulse (bpm): 59 Weight (lbs): 179 Respiratory Rate (breaths/min): 20 Body Mass Index (BMI): 25.7 Blood Pressure (mmHg): 153/72 Reference Range: 80 - 120 mg / dl Electronic Signature(s) Signed: 06/20/2016 5:02:31 PM By: Alric Quan Entered By: Alric Quan on 06/20/2016 08:07:01

## 2016-06-21 NOTE — Progress Notes (Signed)
[name] ([id number]) Visit Report for 06/20/2016 Chief Complaint Document Details Patient Name: [name], [name]. Date of Service: 06/20/2016 8:00 AM Medical Record Number: [id number] Patient Account Number: [id number] Date of Birth/Sex: 02-19-1961 (56 y.o. Male) Treating RN: Ahmed Prima Primary Care Provider: [name] Other Clinician: Referring Provider: [name] Treating Provider/Extender: Frann Rider in Treatment: 2 Information Obtained from: Patient Chief Complaint Patient presents for treatment of an open ulcer due to venous insufficiency to the left lower extremity one on the anterior shin which has been there for about 8 months and one on the lower medial calf which has been there for about 2 weeks Electronic Signature(s) Signed: 06/20/2016 8:48:29 AM By: Christin Fudge MD, FACS Entered By: Christin Fudge on 06/20/2016 08:48:29 Pindell, Dorothea Glassman ([id number]) -------------------------------------------------------------------------------- Debridement Details Patient Name: [name]. Date of Service: 06/20/2016 8:00 AM Medical Record Number: [id number] Patient Account Number: [id number] Date of Birth/Sex: 1960/06/17 (56 y.o. Male) Treating RN: Ahmed Prima Primary Care Provider: [name] Other Clinician: Referring Provider: [name] Treating Provider/Extender: Frann Rider in Treatment: 2 Debridement Performed for Wound #1 Left,Anterior Lower Leg Assessment: Performed By: Physician Christin Fudge, MD Debridement: Debridement Pre-procedure Yes - 08:22 Verification/Time Out Taken: Start Time: 08:25 Pain Control: Lidocaine 4% Topical Solution Level: Skin/Subcutaneous Tissue Total Area Debrided (L x 2.3 (cm) x 1 (cm) = 2.3 (cm) W): Tissue and other Viable, Non-Viable, Exudate, Fibrin/Slough, Subcutaneous material debrided: Instrument: Forceps Bleeding: Minimum Hemostasis Achieved: Pressure End Time:  08:27 Procedural Pain: 0 Post Procedural Pain: 0 Response to Treatment: Procedure was tolerated well Post Debridement Measurements of Total Wound Length: (cm) 0.8 Width: (cm) 0.5 Depth: (cm) 0.1 Volume: (cm) 0.031 Character of Wound/Ulcer Post Requires Further Debridement Debridement: Severity of Tissue Post Debridement: Fat layer exposed Post Procedure Diagnosis Same as Pre-procedure Electronic Signature(s) Signed: 06/20/2016 8:48:17 AM By: Christin Fudge MD, FACS Signed: 06/20/2016 5:02:31 PM By: Alric Quan Entered By: Christin Fudge on 06/20/2016 08:48:17 MEILECH, VIRTS ([id number]) Leadbetter, Dorothea Glassman ([id number]) -------------------------------------------------------------------------------- Debridement Details Patient Name: [name]. Date of Service: 06/20/2016 8:00 AM Medical Record Number: [id number] Patient Account Number: [id number] Date of Birth/Sex: 08/14/1960 (56 y.o. Male) Treating RN: Ahmed Prima Primary Care Provider: [name] Other Clinician: Referring Provider: [name] Treating Provider/Extender: Frann Rider in Treatment: 2 Debridement Performed for Wound #2 Left,Medial Lower Leg Assessment: Performed By: Physician Christin Fudge, MD Debridement: Debridement Pre-procedure Yes - 08:22 Verification/Time Out Taken: Start Time: 08:23 Pain Control: Lidocaine 4% Topical Solution Level: Skin/Subcutaneous Tissue Total Area Debrided (L x 1.3 (cm) x 1.3 (cm) = 1.69 (cm) W): Tissue and other Viable, Non-Viable, Exudate, Fibrin/Slough, Subcutaneous material debrided: Instrument: Forceps Bleeding: Minimum Hemostasis Achieved: Pressure End Time: 08:25 Procedural Pain: 0 Post Procedural Pain: 0 Response to Treatment: Procedure was tolerated well Post Debridement Measurements of Total Wound Length: (cm) 1 Width: (cm) 0.4 Depth: (cm) 0.1 Volume: (cm) 0.031 Character of Wound/Ulcer Post Requires Further  Debridement Debridement: Severity of Tissue Post Debridement: Fat layer exposed Post Procedure Diagnosis Same as Pre-procedure Electronic Signature(s) Signed: 06/20/2016 8:48:23 AM By: Christin Fudge MD, FACS Signed: 06/20/2016 5:02:31 PM By: Alric Quan Entered By: Christin Fudge on 06/20/2016 08:48:23 CASEY, FYE ([id number]) Fleishman, Dorothea Glassman ([id number]) -------------------------------------------------------------------------------- HPI Details Patient Name: [name]. Date of Service: 06/20/2016 8:00 AM Medical Record Number: [id number] Patient Account Number: [id number] Date of Birth/Sex: [birth year] (56 y.o. Male) Treating RN: Ahmed Prima Primary Care Provider: [name] Other Clinician: Referring Provider: [name] Treating Provider/Extender:  Dannae Kato Weeks in Treatment: 2 History of Present Illness Location: left anterior shin and left medial calf Quality: Patient reports experiencing a dull pain to affected area(s). Severity: Patient states wound are getting worse. Duration: Patient has had the wound for > 3 months prior to seeking treatment at the wound center Timing: Pain in wound is Intermittent (comes and goes Context: The wound appeared gradually over time Modifying Factors: Other treatment(s) tried include:had surgery on the left anterior shin for a hematoma which has been nonhealing for the last 8 months Associated Signs and Symptoms: Patient reports having increase swelling. HPI Description: 56 year old diabetic was last hemoglobin A1c was 7.2% has been reviewed today with 2 ulcerated areas in his left lower extremity which she's had for about 8 months and the one most recently was started as a blister for 2 weeks. he has a past medical history of atrial fibrillation, coronary artery disease, CHF, chronic venous stasis dermatitis, diabetes mellitus, hypertension, sleep apnea and is status post kidney transplant in 2012. He is a former  smoker and quit cigars in 1996. In the remote past he's had some arterial and venous duplex studies done but does not recall the results no was ever treated for venous insufficiency. 06/10/2016 -- -- had a office visit at [facility] seen by the physicianos assistant [facility]. Patient underwent an ABI which showed medial calcification, normal tibial artery Doppler waveform, PPG waveforms and toe brachial indices suggest normal arterial perfusion to bilateral lower extremities. right TBI was 0.97 and left was 0.84 Bilateral venous duplex was notable for venous reflux in the left greater saphenous vein and popliteal veins.. A left lower extremity endovenous laser ablation was recommended. Electronic Signature(s) Signed: 06/20/2016 8:48:33 AM By: Christin Fudge MD, FACS Entered By: Christin Fudge on 06/20/2016 08:48:33 ZAMIRE, WHITEHURST ([id number]) -------------------------------------------------------------------------------- Physical Exam Details Patient Name: ADIEL, ERNEY. Date of Service: 06/20/2016 8:00 AM Medical Record Number: [id number] Patient Account Number: [id number] Date of Birth/Sex: 02/23/1961 (56 y.o. Male) Treating RN: Ahmed Prima Primary Care Provider: [name] Other Clinician: Referring Provider: [name] Treating Provider/Extender: Frann Rider in Treatment: 2 Constitutional . Pulse regular. Respirations normal and unlabored. Afebrile. . Eyes Nonicteric. Reactive to light. Ears, Nose, Mouth, and Throat Lips, teeth, and gums WNL.Marland Kitchen Moist mucosa without lesions. Neck supple and nontender. No palpable supraclavicular or cervical adenopathy. Normal sized without goiter. Respiratory WNL. No retractions.. Breath sounds WNL, No rubs, rales, rhonchi, or wheeze.. Cardiovascular Heart rhythm and rate regular, no murmur or gallop.. Pedal Pulses WNL. No clubbing, cyanosis or edema. Chest Breasts symmetical and no nipple  discharge.. Breast tissue WNL, no masses, lumps, or tenderness.. Lymphatic No adneopathy. No adenopathy. No adenopathy. Musculoskeletal Adexa without tenderness or enlargement.. Digits and nails w/o clubbing, cyanosis, infection, petechiae, ischemia, or inflammatory conditions.. Integumentary (Hair, Skin) No suspicious lesions. No crepitus or fluctuance. No peri-wound warmth or erythema. No masses.Marland Kitchen Psychiatric Judgement and insight Intact.. No evidence of depression, anxiety, or agitation.. Notes debridement was done of the wounds and eschar and subcutaneous is debris was removed and there is good resolution of his ulceration in the lymphedema has gone down significantly. Electronic Signature(s) Signed: 06/20/2016 8:49:01 AM By: Christin Fudge MD, FACS Entered By: Christin Fudge on 06/20/2016 08:49:00 [name] ([id number]) -------------------------------------------------------------------------------- Physician Orders Details Patient Name: AVELARDO, REESMAN. Date of Service: 06/20/2016 8:00 AM Medical Record Number: [id number] Patient Account Number: [id number] Date of Birth/Sex: [birth year] (56 y.o. Male) Treating RN: Ahmed Prima Primary Care Provider:  [name] Other Clinician: Referring Provider: [name] Treating Provider/Extender: Frann Rider in Treatment: 2 Verbal / Phone Orders: Yes Clinician: Pinkerton, Debi Read Back and Verified: Yes Diagnosis Coding Wound Cleansing Wound #1 Left,Anterior Lower Leg o Clean wound with Normal Saline. o Cleanse wound with mild soap and water o May Shower, gently pat wound dry prior to applying new dressing. Wound #2 Left,Medial Lower Leg o Clean wound with Normal Saline. o Cleanse wound with mild soap and water o May Shower, gently pat wound dry prior to applying new dressing. Anesthetic Wound #1 Left,Anterior Lower Leg o Topical Lidocaine 4% cream applied to wound bed prior to  debridement Wound #2 Left,Medial Lower Leg o Topical Lidocaine 4% cream applied to wound bed prior to debridement Primary Wound Dressing Wound #1 Left,Anterior Lower Leg o Prisma Ag Wound #2 Left,Medial Lower Leg o Prisma Ag Secondary Dressing Wound #1 Left,Anterior Lower Leg o ABD pad o Dry Gauze Wound #2 Left,Medial Lower Leg o ABD pad o Dry Gauze Dressing Change Frequency Wound #1 Left,Anterior Lower Leg Diekmann, Bernie F. ([id number]) o Change dressing every week Wound #2 Left,Medial Lower Leg o Change dressing every week Follow-up Appointments Wound #1 Left,Anterior Lower Leg o Return Appointment in 1 week. Wound #2 Left,Medial Lower Leg o Return Appointment in 1 week. Edema Control Wound #1 Left,Anterior Lower Leg o 4-Layer Compression System - Left Lower Extremity - unna to anchor o Elevate legs to the level of the heart and pump ankles as often as possible Wound #2 Left,Medial Lower Leg o 4-Layer Compression System - Left Lower Extremity - unna to anchor o Elevate legs to the level of the heart and pump ankles as often as possible Additional Orders / Instructions Wound #1 Left,Anterior Lower Leg o Increase protein intake. Wound #2 Left,Medial Lower Leg o Increase protein intake. Electronic Signature(s) Signed: 06/20/2016 3:13:18 PM By: Christin Fudge MD, FACS Signed: 06/20/2016 5:02:31 PM By: Alric Quan Entered By: Alric Quan on 06/20/2016 08:29:07 [name] ([id number]) -------------------------------------------------------------------------------- Problem List Details Patient Name: PORTER, MOES. Date of Service: 06/20/2016 8:00 AM Medical Record Number: [id number] Patient Account Number: [id number] Date of Birth/Sex: May 19, 1960 (56 y.o. Male) Treating RN: Ahmed Prima Primary Care Provider: [name] Other Clinician: Referring Provider: [name] Treating Provider/Extender: Frann Rider in Treatment: 2 Active Problems ICD-10 Encounter Code Description Active Date Diagnosis E11.622 Type 2 diabetes mellitus with other skin ulcer 06/03/2016 Yes I89.0 Lymphedema, not elsewhere classified 06/03/2016 Yes L97.222 Non-pressure chronic ulcer of left calf with fat layer 06/03/2016 Yes exposed I87.312 Chronic venous hypertension (idiopathic) with ulcer of left 06/03/2016 Yes lower extremity Z94.0 Kidney transplant status 06/03/2016 Yes Inactive Problems Resolved Problems Electronic Signature(s) Signed: 06/20/2016 8:48:01 AM By: Christin Fudge MD, FACS Entered By: Christin Fudge on 06/20/2016 08:48:01 Zody, Dorothea Glassman ([id number]) -------------------------------------------------------------------------------- Progress Note Details Patient Name: [name] Date of Service: 06/20/2016 8:00 AM Medical Record Number: [id number] Patient Account Number: [id number] Date of Birth/Sex: [birth year] (56 y.o. Male) Treating RN: Ahmed Prima Primary Care Provider: [name] Other Clinician: Referring Provider: [name] Treating Provider/Extender: Frann Rider in Treatment: 2 Subjective Chief Complaint Information obtained from Patient Patient presents for treatment of an open ulcer due to venous insufficiency to the left lower extremity one on the anterior shin which has been there for about 8 months and one on the lower medial calf which has been there for about 2 weeks History of Present Illness (HPI) The following HPI elements were documented for  the patient's wound: Location: left anterior shin and left medial calf Quality: Patient reports experiencing a dull pain to affected area(s). Severity: Patient states wound are getting worse. Duration: Patient has had the wound for > 3 months prior to seeking treatment at the wound center Timing: Pain in wound is Intermittent (comes and goes Context: The wound appeared gradually over time Modifying  Factors: Other treatment(s) tried include:had surgery on the left anterior shin for a hematoma which has been nonhealing for the last 8 months Associated Signs and Symptoms: Patient reports having increase swelling. 56 year old diabetic was last hemoglobin A1c was 7.2% has been reviewed today with 2 ulcerated areas in his left lower extremity which she's had for about 8 months and the one most recently was started as a blister for 2 weeks. he has a past medical history of atrial fibrillation, coronary artery disease, CHF, chronic venous stasis dermatitis, diabetes mellitus, hypertension, sleep apnea and is status post kidney transplant in 2012. He is a former smoker and quit cigars in 1996. In the remote past he's had some arterial and venous duplex studies done but does not recall the results no was ever treated for venous insufficiency. 06/10/2016 -- -- had a office visit at [facility] seen by the physician s assistant Marcelle Overlie. Patient underwent an ABI which showed medial calcification, normal tibial artery Doppler waveform, PPG waveforms and toe brachial indices suggest normal arterial perfusion to bilateral lower extremities. right TBI was 0.97 and left was 0.84 Bilateral venous duplex was notable for venous reflux in the left greater saphenous vein and popliteal veins.. A left lower extremity endovenous laser ablation was recommended. SAHIL, MILNER F. ([id number]) Objective Constitutional Pulse regular. Respirations normal and unlabored. Afebrile. Vitals Time Taken: 8:05 AM, Height: 70 in, Weight: 179 lbs, BMI: 25.7, Temperature: 97.6 F, Pulse: 59 bpm, Respiratory Rate: 20 breaths/min, Blood Pressure: 153/72 mmHg. Eyes Nonicteric. Reactive to light. Ears, Nose, Mouth, and Throat Lips, teeth, and gums WNL.Marland Kitchen Moist mucosa without lesions. Neck supple and nontender. No palpable supraclavicular or cervical adenopathy. Normal sized without  goiter. Respiratory WNL. No retractions.. Breath sounds WNL, No rubs, rales, rhonchi, or wheeze.. Cardiovascular Heart rhythm and rate regular, no murmur or gallop.. Pedal Pulses WNL. No clubbing, cyanosis or edema. Chest Breasts symmetical and no nipple discharge.. Breast tissue WNL, no masses, lumps, or tenderness.. Lymphatic No adneopathy. No adenopathy. No adenopathy. Musculoskeletal Adexa without tenderness or enlargement.. Digits and nails w/o clubbing, cyanosis, infection, petechiae, ischemia, or inflammatory conditions.Marland Kitchen Psychiatric Judgement and insight Intact.. No evidence of depression, anxiety, or agitation.. General Notes: debridement was done of the wounds and eschar and subcutaneous is debris was removed and there is good resolution of his ulceration in the lymphedema has gone down significantly. Integumentary (Hair, Skin) No suspicious lesions. No crepitus or fluctuance. No peri-wound warmth or erythema. No masses.. Wound #1 status is Open. Original cause of wound was Trauma. The wound is located on the Left,Anterior Lower Leg. The wound measures 2.3cm length x 1cm width x 0.1cm depth; 1.806cm^2 area and 0.181cm^3 volume. The wound is limited to skin breakdown. There is no tunneling or undermining noted. There is a none present amount of drainage noted. The wound margin is distinct with the outline attached to the wound base. There is no granulation within the wound bed. There is a large (67-100%) amount of Stroebel, Daegon F. ([id number]) necrotic tissue within the wound bed including Eschar. The periwound skin appearance exhibited: Dry/Scaly. Periwound temperature was noted as No  Abnormality. The periwound has tenderness on palpation. Wound #2 status is Open. Original cause of wound was Blister. The wound is located on the Left,Medial Lower Leg. The wound measures 1.3cm length x 1.3cm width x 0.1cm depth; 1.327cm^2 area and 0.133cm^3 volume. The wound is limited to skin  breakdown. There is no tunneling or undermining noted. There is a none present amount of drainage noted. The wound margin is distinct with the outline attached to the wound base. There is no granulation within the wound bed. There is a large (67-100%) amount of necrotic tissue within the wound bed including Eschar. The periwound skin appearance exhibited: Dry/Scaly. Periwound temperature was noted as No Abnormality. The periwound has tenderness on palpation. Assessment Active Problems ICD-10 E11.622 - Type 2 diabetes mellitus with other skin ulcer I89.0 - Lymphedema, not elsewhere classified L97.222 - Non-pressure chronic ulcer of left calf with fat layer exposed I87.312 - Chronic venous hypertension (idiopathic) with ulcer of left lower extremity Z94.0 - Kidney transplant status Procedures Wound #1 Wound #1 is a Diabetic Wound/Ulcer of the Lower Extremity located on the Left,Anterior Lower Leg . There was a Skin/Subcutaneous Tissue Debridement ([id number]) debridement with total area of 2.3 sq cm performed by Christin Fudge, MD. with the following instrument(s): Forceps to remove Viable and Non-Viable tissue/material including Exudate, Fibrin/Slough, and Subcutaneous after achieving pain control using Lidocaine 4% Topical Solution. A time out was conducted at 08:22, prior to the start of the procedure. A Minimum amount of bleeding was controlled with Pressure. The procedure was tolerated well with a pain level of 0 throughout and a pain level of 0 following the procedure. Post Debridement Measurements: 0.8cm length x 0.5cm width x 0.1cm depth; 0.031cm^3 volume. Character of Wound/Ulcer Post Debridement requires further debridement. Severity of Tissue Post Debridement is: Fat layer exposed. Post procedure Diagnosis Wound #1: Same as Pre-Procedure Wound #2 Wound #2 is a Diabetic Wound/Ulcer of the Lower Extremity located on the Left,Medial Lower Leg . There KEYSTON, ARDOLINO.  ([id number]) was a Skin/Subcutaneous Tissue Debridement ([id number]) debridement with total area of 1.69 sq cm performed by Christin Fudge, MD. with the following instrument(s): Forceps to remove Viable and Non-Viable tissue/material including Exudate, Fibrin/Slough, and Subcutaneous after achieving pain control using Lidocaine 4% Topical Solution. A time out was conducted at 08:22, prior to the start of the procedure. A Minimum amount of bleeding was controlled with Pressure. The procedure was tolerated well with a pain level of 0 throughout and a pain level of 0 following the procedure. Post Debridement Measurements: 1cm length x 0.4cm width x 0.1cm depth; 0.031cm^3 volume. Character of Wound/Ulcer Post Debridement requires further debridement. Severity of Tissue Post Debridement is: Fat layer exposed. Post procedure Diagnosis Wound #2: Same as Pre-Procedure Plan Wound Cleansing: Wound #1 Left,Anterior Lower Leg: Clean wound with Normal Saline. Cleanse wound with mild soap and water May Shower, gently pat wound dry prior to applying new dressing. Wound #2 Left,Medial Lower Leg: Clean wound with Normal Saline. Cleanse wound with mild soap and water May Shower, gently pat wound dry prior to applying new dressing. Anesthetic: Wound #1 Left,Anterior Lower Leg: Topical Lidocaine 4% cream applied to wound bed prior to debridement Wound #2 Left,Medial Lower Leg: Topical Lidocaine 4% cream applied to wound bed prior to debridement Primary Wound Dressing: Wound #1 Left,Anterior Lower Leg: Prisma Ag Wound #2 Left,Medial Lower Leg: Prisma Ag Secondary Dressing: Wound #1 Left,Anterior Lower Leg: ABD pad Dry Gauze Wound #2 Left,Medial Lower Leg: ABD pad Dry Gauze Dressing Change  Frequency: Wound #1 Left,Anterior Lower Leg: Change dressing every week Wound #2 Left,Medial Lower Leg: Change dressing every week Follow-up Appointments: ADANTE, COURINGTON ([id number]) Wound #1 Left,Anterior  Lower Leg: Return Appointment in 1 week. Wound #2 Left,Medial Lower Leg: Return Appointment in 1 week. Edema Control: Wound #1 Left,Anterior Lower Leg: 4-Layer Compression System - Left Lower Extremity - unna to anchor Elevate legs to the level of the heart and pump ankles as often as possible Wound #2 Left,Medial Lower Leg: 4-Layer Compression System - Left Lower Extremity - unna to anchor Elevate legs to the level of the heart and pump ankles as often as possible Additional Orders / Instructions: Wound #1 Left,Anterior Lower Leg: Increase protein intake. Wound #2 Left,Medial Lower Leg: Increase protein intake. I have recommended: 1. silver alginate with a 4 layer Profore 2. Elevation and exercise 3. Await his venous ablation 4. continue good control of his diabetes mellitus 5. Regular visits to the wound center Electronic Signature(s) Signed: 06/20/2016 8:49:49 AM By: Christin Fudge MD, FACS Entered By: Christin Fudge on 06/20/2016 08:49:49 Tabor, Dorothea Glassman ([id number]) -------------------------------------------------------------------------------- SuperBill Details Patient Name: [name]. Date of Service: 06/20/2016 Medical Record Number: [id number] Patient Account Number: [id number] Date of Birth/Sex: 08/31/1960 (56 y.o. Male) Treating RN: Ahmed Prima Primary Care Provider: [name] Other Clinician: Referring Provider: [name] Treating Provider/Extender: Frann Rider in Treatment: 2 Diagnosis Coding ICD-10 Codes Code Description [phone] Type 2 diabetes mellitus with other skin ulcer I89.0 Lymphedema, not elsewhere classified L97.222 Non-pressure chronic ulcer of left calf with fat layer exposed I87.312 Chronic venous hypertension (idiopathic) with ulcer of left lower extremity Z94.0 Kidney transplant status Facility Procedures CPT4 Code Description: [id number] 11042 - DEB SUBQ TISSUE 20 SQ CM/< ICD-10 Description Diagnosis E11.622 Type 2  diabetes mellitus with other skin ulcer I89.0 Lymphedema, not elsewhere classified L97.222 Non-pressure chronic ulcer of left calf with fat lay  I87.312 Chronic venous hypertension (idiopathic) with ulcer Modifier: er exposed of left lower Quantity: 1 extremity Physician Procedures CPT4 Code Description: [id number] - WC PHYS SUBQ TISS 20 SQ CM ICD-10 Description Diagnosis E11.622 Type 2 diabetes mellitus with other skin ulcer I89.0 Lymphedema, not elsewhere classified L97.222 Non-pressure chronic ulcer of left calf with fat lay  I87.312 Chronic venous hypertension (idiopathic) with ulcer Modifier: er exposed of left lower Quantity: 1 extremity Electronic Signature(s) Signed: 06/20/2016 8:50:05 AM By: Christin Fudge MD, FACS Entered By: Christin Fudge on 06/20/2016 08:50:05

## 2016-06-25 DIAGNOSIS — I482 Chronic atrial fibrillation: Secondary | ICD-10-CM | POA: Diagnosis not present

## 2016-06-25 DIAGNOSIS — G4733 Obstructive sleep apnea (adult) (pediatric): Secondary | ICD-10-CM | POA: Diagnosis not present

## 2016-06-25 DIAGNOSIS — E662 Morbid (severe) obesity with alveolar hypoventilation: Secondary | ICD-10-CM | POA: Diagnosis not present

## 2016-06-27 ENCOUNTER — Encounter: Payer: 59 | Attending: Surgery | Admitting: Surgery

## 2016-06-27 DIAGNOSIS — I132 Hypertensive heart and chronic kidney disease with heart failure and with stage 5 chronic kidney disease, or end stage renal disease: Secondary | ICD-10-CM | POA: Insufficient documentation

## 2016-06-27 DIAGNOSIS — Z940 Kidney transplant status: Secondary | ICD-10-CM | POA: Insufficient documentation

## 2016-06-27 DIAGNOSIS — Z79899 Other long term (current) drug therapy: Secondary | ICD-10-CM | POA: Diagnosis not present

## 2016-06-27 DIAGNOSIS — K219 Gastro-esophageal reflux disease without esophagitis: Secondary | ICD-10-CM | POA: Insufficient documentation

## 2016-06-27 DIAGNOSIS — L97222 Non-pressure chronic ulcer of left calf with fat layer exposed: Secondary | ICD-10-CM | POA: Diagnosis not present

## 2016-06-27 DIAGNOSIS — I87312 Chronic venous hypertension (idiopathic) with ulcer of left lower extremity: Secondary | ICD-10-CM | POA: Diagnosis not present

## 2016-06-27 DIAGNOSIS — I2510 Atherosclerotic heart disease of native coronary artery without angina pectoris: Secondary | ICD-10-CM | POA: Diagnosis not present

## 2016-06-27 DIAGNOSIS — Z885 Allergy status to narcotic agent status: Secondary | ICD-10-CM | POA: Insufficient documentation

## 2016-06-27 DIAGNOSIS — I4891 Unspecified atrial fibrillation: Secondary | ICD-10-CM | POA: Insufficient documentation

## 2016-06-27 DIAGNOSIS — E1122 Type 2 diabetes mellitus with diabetic chronic kidney disease: Secondary | ICD-10-CM | POA: Diagnosis not present

## 2016-06-27 DIAGNOSIS — Z7901 Long term (current) use of anticoagulants: Secondary | ICD-10-CM | POA: Insufficient documentation

## 2016-06-27 DIAGNOSIS — Z87891 Personal history of nicotine dependence: Secondary | ICD-10-CM | POA: Insufficient documentation

## 2016-06-27 DIAGNOSIS — G4730 Sleep apnea, unspecified: Secondary | ICD-10-CM | POA: Diagnosis not present

## 2016-06-27 DIAGNOSIS — N186 End stage renal disease: Secondary | ICD-10-CM | POA: Insufficient documentation

## 2016-06-27 DIAGNOSIS — I890 Lymphedema, not elsewhere classified: Secondary | ICD-10-CM | POA: Insufficient documentation

## 2016-06-27 DIAGNOSIS — H269 Unspecified cataract: Secondary | ICD-10-CM | POA: Insufficient documentation

## 2016-06-27 DIAGNOSIS — I509 Heart failure, unspecified: Secondary | ICD-10-CM | POA: Insufficient documentation

## 2016-06-27 DIAGNOSIS — M109 Gout, unspecified: Secondary | ICD-10-CM | POA: Insufficient documentation

## 2016-06-27 DIAGNOSIS — Z951 Presence of aortocoronary bypass graft: Secondary | ICD-10-CM | POA: Insufficient documentation

## 2016-06-27 DIAGNOSIS — I872 Venous insufficiency (chronic) (peripheral): Secondary | ICD-10-CM | POA: Diagnosis not present

## 2016-06-27 DIAGNOSIS — J45909 Unspecified asthma, uncomplicated: Secondary | ICD-10-CM | POA: Diagnosis not present

## 2016-06-27 DIAGNOSIS — Z794 Long term (current) use of insulin: Secondary | ICD-10-CM | POA: Insufficient documentation

## 2016-06-27 DIAGNOSIS — L97822 Non-pressure chronic ulcer of other part of left lower leg with fat layer exposed: Secondary | ICD-10-CM | POA: Diagnosis not present

## 2016-06-27 DIAGNOSIS — E11622 Type 2 diabetes mellitus with other skin ulcer: Secondary | ICD-10-CM | POA: Diagnosis not present

## 2016-06-28 DIAGNOSIS — Z940 Kidney transplant status: Secondary | ICD-10-CM | POA: Diagnosis not present

## 2016-06-28 NOTE — Progress Notes (Signed)
PERETZ, THIEME ([id number]) Visit Report for 06/27/2016 Arrival Information Details Patient Name: [name], [name]. Date of Service: 06/27/2016 8:00 AM Medical Record Number: [id number] Patient Account Number: [id number] Date of Birth/Sex: April 06, 1960 (56 y.o. Male) Treating RN: Ahmed Prima Primary Care Bacilio Abascal: [name] Other Clinician: Referring Keli Buehner: [name] Treating Purva Vessell/Extender: Frann Rider in Treatment: 3 Visit Information History Since Last Visit All ordered tests and consults were completed: No Patient Arrived: Ambulatory Added or deleted any medications: No Arrival Time: 08:05 Any new allergies or adverse reactions: No Accompanied By: self Had a fall or experienced change in No Transfer Assistance: None activities of daily living that may affect Patient Identification Verified: Yes risk of falls: Secondary Verification Process Yes Signs or symptoms of abuse/neglect since last No Completed: visito Patient Requires Transmission-Based No Hospitalized since last visit: No Precautions: Has Dressing in Place as Prescribed: Yes Patient Has Alerts: Yes Has Compression in Place as Prescribed: Yes Patient Alerts: DM II Pain Present Now: No Eliquis Electronic Signature(s) Signed: 06/27/2016 5:04:07 PM By: Alric Quan Entered By: Alric Quan on 06/27/2016 08:05:37 [name], [name] -------------------------------------------------------------------------------- Encounter Discharge Information Details Patient Name: [name]. Date of Service: 06/27/2016 8:00 AM Medical Record Number: [id number] Patient Account Number: [id number] Date of Birth/Sex: 02/17/1961 (56 y.o. Male) Treating RN: Ahmed Prima Primary Care Chontel Warning: [name] Other Clinician: Referring Shere Eisenhart: [name] Treating Dariella Gillihan/Extender: Frann Rider in Treatment: 3 Encounter Discharge Information Items Discharge Pain Level:  0 Discharge Condition: Stable Ambulatory Status: Ambulatory Discharge Destination: Home Transportation: Private Auto Accompanied By: self Schedule Follow-up Appointment: Yes Medication Reconciliation completed and provided to Patient/Care No Catina Nuss: Provided on Clinical Summary of Care: 06/27/2016 Form Type Recipient Paper Patient JG Electronic Signature(s) Signed: 06/27/2016 8:51:10 AM By: Ruthine Dose Entered By: Ruthine Dose on 06/27/2016 08:51:10 [name], [name] -------------------------------------------------------------------------------- Lower Extremity Assessment Details Patient Name: Carl Speller F. Date of Service: 06/27/2016 8:00 AM Medical Record Number: [id number] Patient Account Number: [id number] Date of Birth/Sex: 1960/06/08 (56 y.o. Male) Treating RN: Ahmed Prima Primary Care Ron Junco: [name] Other Clinician: Referring Lanecia Sliva: [name] Treating Rhapsody Wolven/Extender: Frann Rider in Treatment: 3 Vascular Assessment Pulses: Dorsalis Pedis Palpable: [Left:Yes] Posterior Tibial Extremity colors, hair growth, and conditions: Extremity Color: [Left:Hyperpigmented] Temperature of Extremity: [Left:Warm] Capillary Refill: [Left:< 3 seconds] Toe Nail Assessment Left: Right: Thick: No Discolored: No Deformed: No Improper Length and Hygiene: No Electronic Signature(s) Signed: 06/27/2016 5:04:07 PM By: Alric Quan Entered By: Alric Quan on 06/27/2016 08:21:27 [name], [name] -------------------------------------------------------------------------------- Multi Wound Chart Details Patient Name: Carl Speller F. Date of Service: 06/27/2016 8:00 AM Medical Record Number: [id number] Patient Account Number: [id number] Date of Birth/Sex: 04-02-1960 (56 y.o. Male) Treating RN: Ahmed Prima Primary Care Tiernan Millikin: [name] Other Clinician: Referring Devika Dragovich: [name] Treating  Haidyn Chadderdon/Extender: Frann Rider in Treatment: 3 Vital Signs Height(in): 70 Pulse(bpm): 50 Weight(lbs): 179 Blood Pressure 150/73 (mmHg): Body Mass Index(BMI): 26 Temperature(F): 97.5 Respiratory Rate 20 (breaths/min): Photos: [1:No Photos] [2:No Photos] [N/A:N/A] Wound Location: [1:Left Lower Leg - Anterior] [2:Left Lower Leg - Medial] [N/A:N/A] Wounding Event: [1:Trauma] [2:Blister] [N/A:N/A] Primary Etiology: [1:Diabetic Wound/Ulcer of the Lower Extremity] [2:Diabetic Wound/Ulcer of the Lower Extremity] [N/A:N/A] Comorbid History: [1:Cataracts, Asthma, Sleep Apnea, Congestive Heart Failure, Coronary Artery Disease, Hypertension, Type II Diabetes, End Stage Renal Disease, Gout] [2:Cataracts, Asthma, Sleep Apnea, Congestive Heart Failure, Coronary Artery Disease,  Hypertension, Type II Diabetes, End Stage Renal Disease, Gout] [N/A:N/A] Date Acquired: [1:10/11/2015] [2:05/20/2016] [N/A:N/A] Weeks of Treatment: [1:3] [  2:3] [N/A:N/A] Wound Status: [1:Open] [2:Open] [N/A:N/A] Measurements L x W x D 1x0.5x0.1 [2:0x0x0] [N/A:N/A] (cm) Area (cm) : [1:0.393] [2:0] [N/A:N/A] Volume (cm) : [1:0.039] [2:0] [N/A:N/A] % Reduction in Area: [1:89.60%] [2:100.00%] [N/A:N/A] % Reduction in Volume: 89.70% [2:100.00%] [N/A:N/A] Classification: [1:Grade 2] [2:Grade 2] [N/A:N/A] Exudate Amount: [1:Large] [2:None Present] [N/A:N/A] Exudate Type: [1:Serosanguineous] [2:N/A] [N/A:N/A] Exudate Color: [1:red, brown] [2:N/A] [N/A:N/A] Wound Margin: [1:Distinct, outline attached] [2:Distinct, outline attached] [N/A:N/A] Granulation Amount: [1:None Present (0%)] [2:None Present (0%)] [N/A:N/A] Necrotic Amount: [1:Large (67-100%)] [2:None Present (0%)] [N/A:N/A] Necrotic Tissue: [1:Eschar] [2:N/A] [N/A:N/A] Exposed Structures: [N/A:N/A] Fascia: No Fascia: No Fat Layer (Subcutaneous Fat Layer (Subcutaneous Tissue) Exposed: No Tissue) Exposed: No Tendon: No Tendon: No Muscle: No Muscle:  No Joint: No Joint: No Bone: No Bone: No Limited to Skin Limited to Skin Breakdown Breakdown Epithelialization: None Large (67-100%) N/A Debridement: Debridement ([id number]- N/A N/A 11047) Pre-procedure 08:29 N/A N/A Verification/Time Out Taken: Pain Control: Lidocaine 4% Topical N/A N/A Solution Tissue Debrided: Fibrin/Slough, Exudates, N/A N/A Subcutaneous Level: Skin/Subcutaneous N/A N/A Tissue Debridement Area (sq 0.5 N/A N/A cm): Instrument: Curette N/A N/A Bleeding: Minimum N/A N/A Hemostasis Achieved: Pressure N/A N/A Procedural Pain: 0 N/A N/A Post Procedural Pain: 0 N/A N/A Debridement Treatment Procedure was tolerated N/A N/A Response: well Post Debridement 0.8x0.5x0.1 N/A N/A Measurements L x W x D (cm) Post Debridement 0.031 N/A N/A Volume: (cm) Periwound Skin Texture: No Abnormalities Noted No Abnormalities Noted N/A Periwound Skin Dry/Scaly: Yes Dry/Scaly: No N/A Moisture: Periwound Skin Color: No Abnormalities Noted No Abnormalities Noted N/A Temperature: No Abnormality No Abnormality N/A Tenderness on Yes No N/A Palpation: Wound Preparation: Ulcer Cleansing: Ulcer Cleansing: N/A Rinsed/Irrigated with Rinsed/Irrigated with Saline Saline Topical Anesthetic Topical Anesthetic Applied: Other: lidocaine Applied: None 4% Procedures Performed: Debridement N/A N/A [name], [name]. ([id number]) Treatment Notes Wound #1 (Left, Anterior Lower Leg) 1. Cleansed with: Clean wound with Normal Saline Cleanse wound with antibacterial soap and water 2. Anesthetic Topical Lidocaine 4% cream to wound bed prior to debridement 4. Dressing Applied: Prisma Ag 5. Secondary Dressing Applied ABD Pad Dry Gauze 7. Secured with Tape 4-Layer Compression System - Left Lower Extremity Notes unna to anchor Electronic Signature(s) Signed: 06/27/2016 9:07:06 AM By: Christin Fudge MD, FACS Entered By: Christin Fudge on 06/27/2016 09:07:06 [name], [name]  ([id number]) -------------------------------------------------------------------------------- [place] Details Patient Name: [name], NED. Date of Service: 06/27/2016 8:00 AM Medical Record Number: [id number] Patient Account Number: [id number] Date of Birth/Sex: [birth year] (56 y.o. Male) Treating RN: Ahmed Prima Primary Care Ellysia Char: [name] Other Clinician: Referring Mendy Chou: [name] Treating Karston Hyland/Extender: Frann Rider in Treatment: 3 Active Inactive ` Nutrition Nursing Diagnoses: Imbalanced nutrition Impaired glucose control: actual or potential Goals: Patient/caregiver agrees to and verbalizes understanding of need to use nutritional supplements and/or vitamins as prescribed Date Initiated: 06/03/2016 Target Resolution Date: 09/28/2016 Goal Status: Active Interventions: Assess patient nutrition upon admission and as needed per policy Notes: ` Orientation to the Wound Care Program Nursing Diagnoses: Knowledge deficit related to the wound healing center program Goals: Patient/caregiver will verbalize understanding of the [facility] Date Initiated: 06/03/2016 Target Resolution Date: 06/29/2016 Goal Status: Active Interventions: Provide education on orientation to the wound center Notes: ` Pain, Acute or Chronic Nursing Diagnoses: [name], [name] ([id number]) Pain, acute or chronic: actual or potential Potential alteration in comfort, pain Goals: Patient/caregiver will verbalize adequate pain control between visits Date Initiated: 06/03/2016 Target Resolution Date: 08/31/2016 Goal Status: Active Interventions: Assess comfort goal upon admission  Complete pain assessment as per visit requirements Notes: ` Wound/Skin Impairment Nursing Diagnoses: Impaired tissue integrity Knowledge deficit related to ulceration/compromised skin integrity Goals: Ulcer/skin breakdown will have a volume reduction of 80%  by week 12 Date Initiated: 06/03/2016 Target Resolution Date: 09/21/2016 Goal Status: Active Interventions: Assess patient/caregiver ability to perform ulcer/skin care regimen upon admission and as needed Assess ulceration(s) every visit Notes: Electronic Signature(s) Signed: 06/27/2016 5:04:07 PM By: Alric Quan Entered By: Alric Quan on 06/27/2016 08:22:42 Krinsky, [name] ([id number]) -------------------------------------------------------------------------------- Pain Assessment Details Patient Name: [name]. Date of Service: 06/27/2016 8:00 AM Medical Record Number: [id number] Patient Account Number: [id number] Date of Birth/Sex: May 29, 1960 (56 y.o. Male) Treating RN: Ahmed Prima Primary Care Mayuri Staples: [name] Other Clinician: Referring Eliam Snapp: [name] Treating Reynol Arnone/Extender: Frann Rider in Treatment: 3 Active Problems Location of Pain Severity and Description of Pain Patient Has Paino No Site Locations With Dressing Change: No Pain Management and Medication Current Pain Management: Electronic Signature(s) Signed: 06/27/2016 5:04:07 PM By: Alric Quan Entered By: Alric Quan on 06/27/2016 08:05:45 GARCIADorothea [name] ([id number]) -------------------------------------------------------------------------------- Patient/Caregiver Education Details Patient Name: [name]. Date of Service: 06/27/2016 8:00 AM Medical Record Number: [id number] Patient Account Number: [id number] Date of Birth/Gender: [birth year] (56 y.o. Male) Treating RN: Ahmed Prima Primary Care Physician: [name] Other Clinician: Referring Physician: [name] Treating Physician/Extender: Frann Rider in Treatment: 3 Education Assessment Education Provided To: Patient Education Topics Provided Wound/Skin Impairment: Handouts: Other: change dressing as ordered Methods: Demonstration, Explain/Verbal Responses: State content  correctly Electronic Signature(s) Signed: 06/27/2016 5:04:07 PM By: Alric Quan Entered By: Alric Quan on 06/27/2016 08:23:24 [name], [name] ([id number]) -------------------------------------------------------------------------------- Wound Assessment Details Patient Name: Carl Speller F. Date of Service: 06/27/2016 8:00 AM Medical Record Number: [id number] Patient Account Number: [id number] Date of Birth/Sex: 09-14-1960 (56 y.o. Male) Treating RN: Ahmed Prima Primary Care Corri Delapaz: [name] Other Clinician: Referring Hafsa Lohn: [name] Treating Jasline Buskirk/Extender: Frann Rider in Treatment: 3 Wound Status Wound Number: 1 Primary Diabetic Wound/Ulcer of the Lower Etiology: Extremity Wound Location: Left Lower Leg - Anterior Wound Open Wounding Event: Trauma Status: Date Acquired: 10/11/2015 Comorbid Cataracts, Asthma, Sleep Apnea, Weeks Of Treatment: 3 History: Congestive Heart Failure, Coronary Clustered Wound: No Artery Disease, Hypertension, Type II Diabetes, End Stage Renal Disease, Gout Photos Photo Uploaded By: Alric Quan on 06/27/2016 16:46:43 Wound Measurements Length: (cm) 1 Width: (cm) 0.5 Depth: (cm) 0.1 Area: (cm) 0.393 Volume: (cm) 0.039 % Reduction in Area: 89.6% % Reduction in Volume: 89.7% Epithelialization: None Tunneling: No Undermining: No Wound Description Classification: Grade 2 Foul Odor Afte Wound Margin: Distinct, outline attached Slough/Fibrino Exudate Amount: Large Exudate Type: Serosanguineous Exudate Color: red, brown r Cleansing: No No Wound Bed Granulation Amount: None Present (0%) Exposed Structure Necrotic Amount: Large (67-100%) Fascia Exposed: No Fristoe, Khaalid F. ([id number]) Necrotic Quality: Eschar Fat Layer (Subcutaneous Tissue) Exposed: No Tendon Exposed: No Muscle Exposed: No Joint Exposed: No Bone Exposed: No Limited to Skin Breakdown Periwound Skin Texture Texture  Color No Abnormalities Noted: No No Abnormalities Noted: No Moisture Temperature / Pain No Abnormalities Noted: No Temperature: No Abnormality Dry / Scaly: Yes Tenderness on Palpation: Yes Wound Preparation Ulcer Cleansing: Rinsed/Irrigated with Saline Topical Anesthetic Applied: Other: lidocaine 4%, Treatment Notes Wound #1 (Left, Anterior Lower Leg) 1. Cleansed with: Clean wound with Normal Saline Cleanse wound with antibacterial soap and water 2. Anesthetic Topical Lidocaine 4% cream to wound bed prior to debridement 4. Dressing Applied: Prisma Ag 5. Secondary Dressing Applied ABD  Pad Dry Gauze 7. Secured with Tape 4-Layer Compression System - Left Lower Extremity Notes unna to anchor Electronic Signature(s) Signed: 06/27/2016 5:04:07 PM By: Alric Quan Entered By: Alric Quan on 06/27/2016 08:20:26 ASHAN, CUEVA ([id number]) -------------------------------------------------------------------------------- Wound Assessment Details Patient Name: Carl Speller F. Date of Service: 06/27/2016 8:00 AM Medical Record Number: [id number] Patient Account Number: [id number] Date of Birth/Sex: 05/11/1960 (56 y.o. Male) Treating RN: Ahmed Prima Primary Care Inocencio Roy: [name] Other Clinician: Referring Ell Tiso: [name] Treating Ashawna Hanback/Extender: Frann Rider in Treatment: 3 Wound Status Wound Number: 2 Primary Diabetic Wound/Ulcer of the Lower Etiology: Extremity Wound Location: Left Lower Leg - Medial Wound Open Wounding Event: Blister Status: Date Acquired: 05/20/2016 Comorbid Cataracts, Asthma, Sleep Apnea, Weeks Of Treatment: 3 History: Congestive Heart Failure, Coronary Clustered Wound: No Artery Disease, Hypertension, Type II Diabetes, End Stage Renal Disease, Gout Photos Photo Uploaded By: Alric Quan on 06/27/2016 16:47:12 Wound Measurements Length: (cm) 0 % Reductio Width: (cm) 0 % Reductio Depth: (cm) 0  Epithelial Area: (cm) 0 Tunneling Volume: (cm) 0 Undermini n in Area: 100% n in Volume: 100% ization: Large (67-100%) : No ng: No Wound Description Classification: Grade 2 Wound Margin: Distinct, outline attached Exudate Amount: None Present Foul Odor After Cleansing: No Slough/Fibrino No Wound Bed Granulation Amount: None Present (0%) Exposed Structure Necrotic Amount: None Present (0%) Fascia Exposed: No Fat Layer (Subcutaneous Tissue) Exposed: No Tendon Exposed: No Settlemyre, Daishon F. ([id number]) Muscle Exposed: No Joint Exposed: No Bone Exposed: No Limited to Skin Breakdown Periwound Skin Texture Texture Color No Abnormalities Noted: No No Abnormalities Noted: No Moisture Temperature / Pain No Abnormalities Noted: No Temperature: No Abnormality Dry / Scaly: No Wound Preparation Ulcer Cleansing: Rinsed/Irrigated with Saline Topical Anesthetic Applied: None Electronic Signature(s) Signed: 06/27/2016 5:04:07 PM By: Alric Quan Entered By: Alric Quan on 06/27/2016 08:34:30 Anker, [name] ([id number]) -------------------------------------------------------------------------------- Vitals Details Patient Name: [name]. Date of Service: 06/27/2016 8:00 AM Medical Record Number: [id number] Patient Account Number: [id number] Date of Birth/Sex: 1960-07-14 (56 y.o. Male) Treating RN: Ahmed Prima Primary Care Tujuana Kilmartin: [name] Other Clinician: Referring Marvens Hollars: [name] Treating Khila Papp/Extender: Frann Rider in Treatment: 3 Vital Signs Time Taken: 08:05 Temperature (F): 97.5 Height (in): 70 Pulse (bpm): 50 Weight (lbs): 179 Respiratory Rate (breaths/min): 20 Body Mass Index (BMI): 25.7 Blood Pressure (mmHg): 150/73 Reference Range: 80 - 120 mg / dl Electronic Signature(s) Signed: 06/27/2016 5:04:07 PM By: Alric Quan Entered By: Alric Quan on 06/27/2016 08:07:31

## 2016-06-28 NOTE — Progress Notes (Signed)
[name] ([id number]) Visit Report for 06/27/2016 Chief Complaint Document Details Patient Name: [name], [name]. Date of Service: 06/27/2016 8:00 AM Medical Record Number: [id number] Patient Account Number: [id number] Date of Birth/Sex: 1960-10-12 (56 y.o. Male) Treating RN: Ahmed Prima Primary Care Provider: [name] Other Clinician: Referring Provider: [name] Treating Provider/Extender: Frann Rider in Treatment: 3 Information Obtained from: Patient Chief Complaint Patient presents for treatment of an open ulcer due to venous insufficiency to the left lower extremity one on the anterior shin which has been there for about 8 months and one on the lower medial calf which has been there for about 2 weeks Electronic Signature(s) Signed: 06/27/2016 9:07:22 AM By: Christin Fudge MD, FACS Entered By: Christin Fudge on 06/27/2016 09:07:22 [name] ([id number]) -------------------------------------------------------------------------------- Debridement Details Patient Name: [name]. Date of Service: 06/27/2016 8:00 AM Medical Record Number: [id number] Patient Account Number: [id number] Date of Birth/Sex: 1960/11/28 (56 y.o. Male) Treating RN: Ahmed Prima Primary Care Provider: [name] Other Clinician: Referring Provider: [name] Treating Provider/Extender: Frann Rider in Treatment: 3 Debridement Performed for Wound #1 Left,Anterior Lower Leg Assessment: Performed By: Physician Christin Fudge, MD Debridement: Debridement Pre-procedure Yes - 08:29 Verification/Time Out Taken: Start Time: 08:30 Pain Control: Lidocaine 4% Topical Solution Level: Skin/Subcutaneous Tissue Total Area Debrided (L x 1 (cm) x 0.5 (cm) = 0.5 (cm) W): Tissue and other Viable, Non-Viable, Exudate, Fibrin/Slough, Subcutaneous material debrided: Instrument: Curette Bleeding: Minimum Hemostasis Achieved: Pressure End Time: 08:33 Procedural  Pain: 0 Post Procedural Pain: 0 Response to Treatment: Procedure was tolerated well Post Debridement Measurements of Total Wound Length: (cm) 0.8 Width: (cm) 0.5 Depth: (cm) 0.1 Volume: (cm) 0.031 Character of Wound/Ulcer Post Requires Further Debridement Debridement: Severity of Tissue Post Debridement: Fat layer exposed Post Procedure Diagnosis Same as Pre-procedure Electronic Signature(s) Signed: 06/27/2016 9:07:15 AM By: Christin Fudge MD, FACS Signed: 06/27/2016 5:04:07 PM By: Alric Quan Entered By: Christin Fudge on 06/27/2016 09:07:15 CHEZ, BULNES ([id number] Kanouse, Dorothea Glassman ([id number]) -------------------------------------------------------------------------------- HPI Details Patient Name: [name]. Date of Service: 06/27/2016 8:00 AM Medical Record Number: [id number] Patient Account Number: [id number] Date of Birth/Sex: 21-Aug-1960 (56 y.o. Male) Treating RN: Ahmed Prima Primary Care Provider: [name] Other Clinician: Referring Provider: [name] Treating Provider/Extender: Frann Rider in Treatment: 3 History of Present Illness Location: left anterior shin and left medial calf Quality: Patient reports experiencing a dull pain to affected area(s). Severity: Patient states wound are getting worse. Duration: Patient has had the wound for > 3 months prior to seeking treatment at the wound center Timing: Pain in wound is Intermittent (comes and goes Context: The wound appeared gradually over time Modifying Factors: Other treatment(s) tried include:had surgery on the left anterior shin for a hematoma which has been nonhealing for the last 8 months Associated Signs and Symptoms: Patient reports having increase swelling. HPI Description: 56 year old diabetic was last hemoglobin A1c was 7.2% has been reviewed today with 2 ulcerated areas in his left lower extremity which she's had for about 8 months and the one most recently was started  as a blister for 2 weeks. he has a past medical history of atrial fibrillation, coronary artery disease, CHF, chronic venous stasis dermatitis, diabetes mellitus, hypertension, sleep apnea and is status post kidney transplant in 2012. He is a former smoker and quit cigars in 1996. In the remote past he's had some arterial and venous duplex studies done but does not recall the results no was ever treated for  venous insufficiency. 06/10/2016 -- -- had a office visit at [facility] seen by the physicianos assistant [facility]. Patient underwent an ABI which showed medial calcification, normal tibial artery Doppler waveform, PPG waveforms and toe brachial indices suggest normal arterial perfusion to bilateral lower extremities. right TBI was 0.97 and left was 0.84 Bilateral venous duplex was notable for venous reflux in the left greater saphenous vein and popliteal veins.. A left lower extremity endovenous laser ablation was recommended. 06/27/2016 -- I understand he spoke to his insurance company and there is a 3 month waiting period with conservative therapy prior to authorizing surgical intervention. Electronic Signature(s) Signed: 06/27/2016 9:07:51 AM By: Christin Fudge MD, FACS Entered By: Christin Fudge on 06/27/2016 09:07:51 [name] ([id number]) -------------------------------------------------------------------------------- Physical Exam Details Patient Name: [name]. Date of Service: 06/27/2016 8:00 AM Medical Record Number: [id number] Patient Account Number: [id number] Date of Birth/Sex: 1960/10/02 (56 y.o. Male) Treating RN: Ahmed Prima Primary Care Provider: [name] Other Clinician: Referring Provider: [name] Treating Provider/Extender: Frann Rider in Treatment: 3 Constitutional . Pulse regular. Respirations normal and unlabored. Afebrile. . Eyes Nonicteric. Reactive to light. Ears, Nose, Mouth, and Throat Lips,  teeth, and gums WNL.Marland Kitchen Moist mucosa without lesions. Neck supple and nontender. No palpable supraclavicular or cervical adenopathy. Normal sized without goiter. Respiratory WNL. No retractions.. Breath sounds WNL, No rubs, rales, rhonchi, or wheeze.. Cardiovascular Heart rhythm and rate regular, no murmur or gallop.. Pedal Pulses WNL. No clubbing, cyanosis or edema. Lymphatic No adneopathy. No adenopathy. No adenopathy. Musculoskeletal Adexa without tenderness or enlargement.. Digits and nails w/o clubbing, cyanosis, infection, petechiae, ischemia, or inflammatory conditions.. Integumentary (Hair, Skin) No suspicious lesions. No crepitus or fluctuance. No peri-wound warmth or erythema. No masses.Marland Kitchen Psychiatric Judgement and insight Intact.. No evidence of depression, anxiety, or agitation.. Notes lymphedema has come down significantly but he needed debridement of the lateral wound which is down to the subcutaneous tissue. The more medial wound has healed. Electronic Signature(s) Signed: 06/27/2016 9:08:16 AM By: Christin Fudge MD, FACS Entered By: Christin Fudge on 06/27/2016 09:08:16 [name] ([id number]) -------------------------------------------------------------------------------- Physician Orders Details Patient Name: NAFTALI, CARCHI. Date of Service: 06/27/2016 8:00 AM Medical Record Number: [id number] Patient Account Number: [id number] Date of Birth/Sex: Oct 28, 1960 (56 y.o. Male) Treating RN: Ahmed Prima Primary Care Provider: [name] Other Clinician: Referring Provider: [name] Treating Provider/Extender: Frann Rider in Treatment: 3 Verbal / Phone Orders: Yes Clinician: Pinkerton, Debi Read Back and Verified: Yes Diagnosis Coding Wound Cleansing Wound #1 Left,Anterior Lower Leg o Clean wound with Normal Saline. o Cleanse wound with mild soap and water o May Shower, gently pat wound dry prior to applying new  dressing. Anesthetic Wound #1 Left,Anterior Lower Leg o Topical Lidocaine 4% cream applied to wound bed prior to debridement Primary Wound Dressing Wound #1 Left,Anterior Lower Leg o Prisma Ag Secondary Dressing Wound #1 Left,Anterior Lower Leg o ABD pad o Dry Gauze Dressing Change Frequency Wound #1 Left,Anterior Lower Leg o Change dressing every week Follow-up Appointments Wound #1 Left,Anterior Lower Leg o Return Appointment in 1 week. Edema Control Wound #1 Left,Anterior Lower Leg o 4-Layer Compression System - Left Lower Extremity - unna to anchor o Elevate legs to the level of the heart and pump ankles as often as possible Additional Orders / Instructions Wound #1 Left,Anterior Lower Leg o Increase protein intake. WHITT, AULETTA ([id number]) Electronic Signature(s) Signed: 06/27/2016 4:30:26 PM By: Christin Fudge MD, FACS Signed: 06/27/2016 5:04:07 PM By: Carolyne Fiscal  Debra Entered By: Alric Quan on 06/27/2016 08:35:03 Bergin, Dorothea Glassman ([id number]) -------------------------------------------------------------------------------- Problem List Details Patient Name: YEHONATAN, GRANDISON F. Date of Service: 06/27/2016 8:00 AM Medical Record Number: [id number] Patient Account Number: [id number] Date of Birth/Sex: [birth year] (56 y.o. Male) Treating RN: Ahmed Prima Primary Care Provider: [name] Other Clinician: Referring Provider: [name] Treating Provider/Extender: Frann Rider in Treatment: 3 Active Problems ICD-10 Encounter Code Description Active Date Diagnosis E11.622 Type 2 diabetes mellitus with other skin ulcer 06/03/2016 Yes I89.0 Lymphedema, not elsewhere classified 06/03/2016 Yes L97.222 Non-pressure chronic ulcer of left calf with fat layer 06/03/2016 Yes exposed I87.312 Chronic venous hypertension (idiopathic) with ulcer of left 06/03/2016 Yes lower extremity Z94.0 Kidney transplant status 06/03/2016 Yes Inactive  Problems Resolved Problems Electronic Signature(s) Signed: 06/27/2016 9:07:02 AM By: Christin Fudge MD, FACS Entered By: Christin Fudge on 06/27/2016 [place] [name] ([id number]) -------------------------------------------------------------------------------- Progress Note Details Patient Name: [name] Date of Service: 06/27/2016 8:00 AM Medical Record Number: [id number] Patient Account Number: [id number] Date of Birth/Sex: 1960/08/31 (56 y.o. Male) Treating RN: Ahmed Prima Primary Care Provider: [name] Other Clinician: Referring Provider: [name] Treating Provider/Extender: Frann Rider in Treatment: 3 Subjective Chief Complaint Information obtained from Patient Patient presents for treatment of an open ulcer due to venous insufficiency to the left lower extremity one on the anterior shin which has been there for about 8 months and one on the lower medial calf which has been there for about 2 weeks History of Present Illness (HPI) The following HPI elements were documented for the patient's wound: Location: left anterior shin and left medial calf Quality: Patient reports experiencing a dull pain to affected area(s). Severity: Patient states wound are getting worse. Duration: Patient has had the wound for > 3 months prior to seeking treatment at the wound center Timing: Pain in wound is Intermittent (comes and goes Context: The wound appeared gradually over time Modifying Factors: Other treatment(s) tried include:had surgery on the left anterior shin for a hematoma which has been nonhealing for the last 8 months Associated Signs and Symptoms: Patient reports having increase swelling. 56 year old diabetic was last hemoglobin A1c was 7.2% has been reviewed today with 2 ulcerated areas in his left lower extremity which she's had for about 8 months and the one most recently was started as a blister for 2 weeks. he has a past medical history of  atrial fibrillation, coronary artery disease, CHF, chronic venous stasis dermatitis, diabetes mellitus, hypertension, sleep apnea and is status post kidney transplant in 2012. He is a former smoker and quit cigars in 1996. In the remote past he's had some arterial and venous duplex studies done but does not recall the results no was ever treated for venous insufficiency. 06/10/2016 -- -- had a office visit at [facility] seen by the physician s assistant Marcelle Overlie. Patient underwent an ABI which showed medial calcification, normal tibial artery Doppler waveform, PPG waveforms and toe brachial indices suggest normal arterial perfusion to bilateral lower extremities. right TBI was 0.97 and left was 0.84 Bilateral venous duplex was notable for venous reflux in the left greater saphenous vein and popliteal veins.. A left lower extremity endovenous laser ablation was recommended. 06/27/2016 -- I understand he spoke to his insurance company and there is a 3 month waiting period with conservative therapy prior to authorizing surgical intervention. LATREL, SZYMCZAK F. ([id number]) Objective Constitutional Pulse regular. Respirations normal and unlabored. Afebrile. Vitals Time Taken: 8:05 AM, Height: 70 in, Weight:  179 lbs, BMI: 25.7, Temperature: 97.5 F, Pulse: 50 bpm, Respiratory Rate: 20 breaths/min, Blood Pressure: 150/73 mmHg. Eyes Nonicteric. Reactive to light. Ears, Nose, Mouth, and Throat Lips, teeth, and gums WNL.Marland Kitchen Moist mucosa without lesions. Neck supple and nontender. No palpable supraclavicular or cervical adenopathy. Normal sized without goiter. Respiratory WNL. No retractions.. Breath sounds WNL, No rubs, rales, rhonchi, or wheeze.. Cardiovascular Heart rhythm and rate regular, no murmur or gallop.. Pedal Pulses WNL. No clubbing, cyanosis or edema. Lymphatic No adneopathy. No adenopathy. No adenopathy. Musculoskeletal Adexa without tenderness or  enlargement.. Digits and nails w/o clubbing, cyanosis, infection, petechiae, ischemia, or inflammatory conditions.Marland Kitchen Psychiatric Judgement and insight Intact.. No evidence of depression, anxiety, or agitation.. General Notes: lymphedema has come down significantly but he needed debridement of the lateral wound which is down to the subcutaneous tissue. The more medial wound has healed. Integumentary (Hair, Skin) No suspicious lesions. No crepitus or fluctuance. No peri-wound warmth or erythema. No masses.. Wound #1 status is Open. Original cause of wound was Trauma. The wound is located on the Left,Anterior Lower Leg. The wound measures 1cm length x 0.5cm width x 0.1cm depth; 0.393cm^2 area and 0.039cm^3 volume. The wound is limited to skin breakdown. There is no tunneling or undermining noted. There is a large amount of serosanguineous drainage noted. The wound margin is distinct with the outline attached to the wound base. There is no granulation within the wound bed. There is a large (67-100%) amount of necrotic tissue within the wound bed including Eschar. The periwound skin appearance ALYSSA, MANCERA. ([id number]) exhibited: Dry/Scaly. Periwound temperature was noted as No Abnormality. The periwound has tenderness on palpation. Wound #2 status is Open. Original cause of wound was Blister. The wound is located on the Left,Medial Lower Leg. The wound measures 0cm length x 0cm width x 0cm depth; 0cm^2 area and 0cm^3 volume. The wound is limited to skin breakdown. There is no tunneling or undermining noted. There is a none present amount of drainage noted. The wound margin is distinct with the outline attached to the wound base. There is no granulation within the wound bed. There is no necrotic tissue within the wound bed. The periwound skin appearance did not exhibit: Dry/Scaly. Periwound temperature was noted as No Abnormality. Assessment Active Problems ICD-10 E11.622 - Type 2 diabetes  mellitus with other skin ulcer I89.0 - Lymphedema, not elsewhere classified L97.222 - Non-pressure chronic ulcer of left calf with fat layer exposed I87.312 - Chronic venous hypertension (idiopathic) with ulcer of left lower extremity Z94.0 - Kidney transplant status Procedures Wound #1 Wound #1 is a Diabetic Wound/Ulcer of the Lower Extremity located on the Left,Anterior Lower Leg . There was a Skin/Subcutaneous Tissue Debridement ([id number]) debridement with total area of 0.5 sq cm performed by Christin Fudge, MD. with the following instrument(s): Curette to remove Viable and Non-Viable tissue/material including Exudate, Fibrin/Slough, and Subcutaneous after achieving pain control using Lidocaine 4% Topical Solution. A time out was conducted at 08:29, prior to the start of the procedure. A Minimum amount of bleeding was controlled with Pressure. The procedure was tolerated well with a pain level of 0 throughout and a pain level of 0 following the procedure. Post Debridement Measurements: 0.8cm length x 0.5cm width x 0.1cm depth; 0.031cm^3 volume. Character of Wound/Ulcer Post Debridement requires further debridement. Severity of Tissue Post Debridement is: Fat layer exposed. Post procedure Diagnosis Wound #1: Same as Pre-Procedure TOMI, PADDOCK. ([id number]) Plan Wound Cleansing: Wound #1 Left,Anterior Lower Leg: Clean wound with Normal  Saline. Cleanse wound with mild soap and water May Shower, gently pat wound dry prior to applying new dressing. Anesthetic: Wound #1 Left,Anterior Lower Leg: Topical Lidocaine 4% cream applied to wound bed prior to debridement Primary Wound Dressing: Wound #1 Left,Anterior Lower Leg: Prisma Ag Secondary Dressing: Wound #1 Left,Anterior Lower Leg: ABD pad Dry Gauze Dressing Change Frequency: Wound #1 Left,Anterior Lower Leg: Change dressing every week Follow-up Appointments: Wound #1 Left,Anterior Lower Leg: Return Appointment in 1  week. Edema Control: Wound #1 Left,Anterior Lower Leg: 4-Layer Compression System - Left Lower Extremity - unna to anchor Elevate legs to the level of the heart and pump ankles as often as possible Additional Orders / Instructions: Wound #1 Left,Anterior Lower Leg: Increase protein intake. I have recommended: 1. silver collagen with a 4 layer Profore 2. Elevation and exercise 3. Await his venous ablation -- will probably be 3 months before he gets his surgery 4. He has already got compression stockings from [place] and asked him to get them every time he visits [name] 5. continue good control of his diabetes mellitus 6. Regular visits to the wound center Electronic Signature(s) Signed: 06/27/2016 9:09:18 AM By: Christin Fudge MD, FACS PHILLIP, MAFFEI FMarland Kitchen ([id number]) Entered By: Christin Fudge on 06/27/2016 09:09:17 [name] ([id number]) -------------------------------------------------------------------------------- SuperBill Details Patient Name: [name]. Date of Service: 06/27/2016 Medical Record Number: [id number] Patient Account Number: [id number] Date of Birth/Sex: 07-12-1960 (56 y.o. Male) Treating RN: Ahmed Prima Primary Care Provider: [name] Other Clinician: Referring Provider: [name] Treating Provider/Extender: Frann Rider in Treatment: 3 Diagnosis Coding ICD-10 Codes Code Description [phone] Type 2 diabetes mellitus with other skin ulcer I89.0 Lymphedema, not elsewhere classified L97.222 Non-pressure chronic ulcer of left calf with fat layer exposed I87.312 Chronic venous hypertension (idiopathic) with ulcer of left lower extremity Z94.0 Kidney transplant status Facility Procedures CPT4 Code Description: [id number] 11042 - DEB SUBQ TISSUE 20 SQ CM/< ICD-10 Description Diagnosis E11.622 Type 2 diabetes mellitus with other skin ulcer I89.0 Lymphedema, not elsewhere classified L97.222 Non-pressure chronic ulcer of left calf with fat lay   I87.312 Chronic venous hypertension (idiopathic) with ulcer Modifier: er exposed of left lower Quantity: 1 extremity Physician Procedures CPT4 Code Description: [id number] - WC PHYS SUBQ TISS 20 SQ CM ICD-10 Description Diagnosis E11.622 Type 2 diabetes mellitus with other skin ulcer I89.0 Lymphedema, not elsewhere classified L97.222 Non-pressure chronic ulcer of left calf with fat lay  I87.312 Chronic venous hypertension (idiopathic) with ulcer Modifier: er exposed of left lower Quantity: 1 extremity Electronic Signature(s) Signed: 06/27/2016 9:09:28 AM By: Christin Fudge MD, FACS Entered By: Christin Fudge on 06/27/2016 12:45:80

## 2016-07-04 ENCOUNTER — Encounter: Payer: 59 | Admitting: Surgery

## 2016-07-04 DIAGNOSIS — I87312 Chronic venous hypertension (idiopathic) with ulcer of left lower extremity: Secondary | ICD-10-CM | POA: Diagnosis not present

## 2016-07-04 DIAGNOSIS — E11622 Type 2 diabetes mellitus with other skin ulcer: Secondary | ICD-10-CM | POA: Diagnosis not present

## 2016-07-04 DIAGNOSIS — I132 Hypertensive heart and chronic kidney disease with heart failure and with stage 5 chronic kidney disease, or end stage renal disease: Secondary | ICD-10-CM | POA: Diagnosis not present

## 2016-07-04 DIAGNOSIS — L97222 Non-pressure chronic ulcer of left calf with fat layer exposed: Secondary | ICD-10-CM | POA: Diagnosis not present

## 2016-07-04 DIAGNOSIS — L97822 Non-pressure chronic ulcer of other part of left lower leg with fat layer exposed: Secondary | ICD-10-CM | POA: Diagnosis not present

## 2016-07-04 DIAGNOSIS — I4891 Unspecified atrial fibrillation: Secondary | ICD-10-CM | POA: Diagnosis not present

## 2016-07-04 DIAGNOSIS — Z940 Kidney transplant status: Secondary | ICD-10-CM | POA: Diagnosis not present

## 2016-07-04 DIAGNOSIS — I509 Heart failure, unspecified: Secondary | ICD-10-CM | POA: Diagnosis not present

## 2016-07-04 DIAGNOSIS — I2510 Atherosclerotic heart disease of native coronary artery without angina pectoris: Secondary | ICD-10-CM | POA: Diagnosis not present

## 2016-07-04 DIAGNOSIS — I872 Venous insufficiency (chronic) (peripheral): Secondary | ICD-10-CM | POA: Diagnosis not present

## 2016-07-04 DIAGNOSIS — I890 Lymphedema, not elsewhere classified: Secondary | ICD-10-CM | POA: Diagnosis not present

## 2016-07-06 NOTE — Progress Notes (Signed)
RITIK, STAVOLA ([id number]) Visit Report for 07/04/2016 Arrival Information Details Patient Name: [name], [name]. Date of Service: 07/04/2016 8:00 AM Medical Record Number: [id number] Patient Account Number: [id number] Date of Birth/Sex: Feb 12, 1961 (56 y.o. Male) Treating RN: Ahmed Prima Primary Care Graceyn Fodor: [name] Other Clinician: Referring Heidemarie Goodnow: [name] Treating Kalee Broxton/Extender: Frann Rider in Treatment: 4 Visit Information History Since Last Visit All ordered tests and consults were completed: No Patient Arrived: Ambulatory Added or deleted any medications: No Arrival Time: 08:08 Any new allergies or adverse reactions: No Accompanied By: self Had a fall or experienced change in No Transfer Assistance: None activities of daily living that may affect Patient Identification Verified: Yes risk of falls: Secondary Verification Process Yes Signs or symptoms of abuse/neglect since last No Completed: visito Patient Requires Transmission-Based No Hospitalized since last visit: No Precautions: Has Dressing in Place as Prescribed: Yes Patient Has Alerts: Yes Has Compression in Place as Prescribed: Yes Patient Alerts: DM II Pain Present Now: No Eliquis Electronic Signature(s) Signed: 07/04/2016 4:33:22 PM By: Alric Quan Entered By: Alric Quan on 07/04/2016 08:08:38 [name] ([id number]) -------------------------------------------------------------------------------- Encounter Discharge Information Details Patient Name: [name]. Date of Service: 07/04/2016 8:00 AM Medical Record Number: [id number] Patient Account Number: [id number] Date of Birth/Sex: [birth year] (56 y.o. Male) Treating RN: Ahmed Prima Primary Care Ramey Ketcherside: [name] Other Clinician: Referring Kirkland Figg: [name] Treating Landrie Beale/Extender: Frann Rider in Treatment: 4 Encounter Discharge Information Items Discharge Pain  Level: 0 Discharge Condition: Stable Ambulatory Status: Ambulatory Discharge Destination: Home Transportation: Private Auto Accompanied By: self Schedule Follow-up Appointment: Yes Medication Reconciliation completed and provided to Patient/Care No Arina Torry: Provided on Clinical Summary of Care: 07/04/2016 Form Type Recipient Paper Patient JG Electronic Signature(s) Signed: 07/04/2016 8:44:47 AM By: Ruthine Dose Entered By: Ruthine Dose on 07/04/2016 08:44:47 [name], [name] ([id number]) -------------------------------------------------------------------------------- Lower Extremity Assessment Details Patient Name: [name] F. Date of Service: 07/04/2016 8:00 AM Medical Record Number: [id number] Patient Account Number: [id number] Date of Birth/Sex: December 03, 1960 (56 y.o. Male) Treating RN: Ahmed Prima Primary Care Tareek Sabo: [name] Other Clinician: Referring Laelah Siravo: [name] Treating Ermal Brzozowski/Extender: Frann Rider in Treatment: 4 Edema Assessment Assessed: Shirlyn Goltz: No] Patrice Paradise: No] E[Left: dema] [Right: :] Calf Left: Right: Point of Measurement: 32 cm From Medial Instep 38 cm cm Ankle Left: Right: Point of Measurement: 11 cm From Medial Instep 23 cm cm Vascular Assessment Pulses: Dorsalis Pedis Palpable: [Left:Yes] Posterior Tibial Extremity colors, hair growth, and conditions: Extremity Color: [Left:Hyperpigmented] Temperature of Extremity: [Left:Warm] Capillary Refill: [Left:< 3 seconds] Electronic Signature(s) Signed: 07/04/2016 4:33:22 PM By: Alric Quan Entered By: Alric Quan on 07/04/2016 08:32:27 [name], [name] ([id number]) -------------------------------------------------------------------------------- Multi Wound Chart Details Patient Name: [name] F. Date of Service: 07/04/2016 8:00 AM Medical Record Number: [id number] Patient Account Number: [id number] Date of Birth/Sex: Aug 28, 1960 (56 y.o. Male) Treating RN:  Ahmed Prima Primary Care Damica Gravlin: [name] Other Clinician: Referring Aldea Avis: [name] Treating Tinea Nobile/Extender: Frann Rider in Treatment: 4 Vital Signs Height(in): 70 Pulse(bpm): 55 Weight(lbs): 179 Blood Pressure 144/71 (mmHg): Body Mass Index(BMI): 26 Temperature(F): 97.6 Respiratory Rate 20 (breaths/min): Photos: [1:No Photos] [N/A:N/A] Wound Location: [1:Left Lower Leg - Anterior] [N/A:N/A] Wounding Event: [1:Trauma] [N/A:N/A] Primary Etiology: [1:Diabetic Wound/Ulcer of the Lower Extremity] [N/A:N/A] Comorbid History: [1:Cataracts, Asthma, Sleep Apnea, Congestive Heart Failure, Coronary Artery Disease, Hypertension, Type II Diabetes, End Stage Renal Disease, Gout] [N/A:N/A] Date Acquired: [1:10/11/2015] [N/A:N/A] Weeks of Treatment: [1:4] [N/A:N/A] Wound Status: [1:Open] [N/A:N/A] Measurements L x W x D 0.1x0.1x0.1 [  N/A:N/A] (cm) Area (cm) : [1:0.008] [N/A:N/A] Volume (cm) : [1:0.001] [N/A:N/A] % Reduction in Area: [1:99.80%] [N/A:N/A] % Reduction in Volume: 99.70% [N/A:N/A] Classification: [1:Grade 2] [N/A:N/A] Exudate Amount: [1:Large] [N/A:N/A] Exudate Type: [1:Serosanguineous] [N/A:N/A] Exudate Color: [1:red, brown] [N/A:N/A] Wound Margin: [1:Distinct, outline attached] [N/A:N/A] Granulation Amount: [1:None Present (0%)] [N/A:N/A] Necrotic Amount: [1:Large (67-100%)] [N/A:N/A] Necrotic Tissue: [1:Eschar] [N/A:N/A] Exposed Structures: [N/A:N/A] Fascia: No Fat Layer (Subcutaneous Tissue) Exposed: No Tendon: No Muscle: No Joint: No Bone: No Limited to Skin Breakdown Epithelialization: None N/A N/A Debridement: Debridement ([id number]- N/A N/A 11047) Pre-procedure 08:24 N/A N/A Verification/Time Out Taken: Pain Control: Lidocaine 4% Topical N/A N/A Solution Tissue Debrided: Fibrin/Slough, Exudates, N/A N/A Subcutaneous Level: Skin/Subcutaneous N/A N/A Tissue Debridement Area (sq 0.01 N/A N/A cm): Instrument: Forceps  N/A N/A Bleeding: Minimum N/A N/A Hemostasis Achieved: Pressure N/A N/A Procedural Pain: 0 N/A N/A Post Procedural Pain: 0 N/A N/A Debridement Treatment Procedure was tolerated N/A N/A Response: well Post Debridement 0.2x0.2x0.1 N/A N/A Measurements L x W x D (cm) Post Debridement 0.003 N/A N/A Volume: (cm) Periwound Skin Texture: No Abnormalities Noted N/A N/A Periwound Skin Dry/Scaly: Yes N/A N/A Moisture: Periwound Skin Color: No Abnormalities Noted N/A N/A Temperature: No Abnormality N/A N/A Tenderness on Yes N/A N/A Palpation: Wound Preparation: Ulcer Cleansing: N/A N/A Rinsed/Irrigated with Saline Topical Anesthetic Applied: Other: lidocaine 4% Procedures Performed: Debridement N/A N/A [name], SCHULTES. ([id number]) Treatment Notes Wound #1 (Left, Anterior Lower Leg) 1. Cleansed with: Clean wound with Normal Saline Cleanse wound with antibacterial soap and water 2. Anesthetic Topical Lidocaine 4% cream to wound bed prior to debridement 4. Dressing Applied: Other dressing (specify in notes) 7. Secured with Tape 4-Layer Compression System - Left Lower Extremity Notes unna to anchor, restore foam Electronic Signature(s) Signed: 07/04/2016 8:48:56 AM By: Christin Fudge MD, FACS Entered By: Christin Fudge on 07/04/2016 08:48:56 [name], [name] ([id number]) -------------------------------------------------------------------------------- [place] Details Patient Name: [name], [name]. Date of Service: 07/04/2016 8:00 AM Medical Record Number: [id number] Patient Account Number: [id number] Date of Birth/Sex: 1960-09-22 (56 y.o. Male) Treating RN: Ahmed Prima Primary Care Edmundo Tedesco: [name] Other Clinician: Referring Richetta Cubillos: [name] Treating Devany Aja/Extender: Frann Rider in Treatment: 4 Active Inactive ` Nutrition Nursing Diagnoses: Imbalanced nutrition Impaired glucose control: actual or  potential Goals: Patient/caregiver agrees to and verbalizes understanding of need to use nutritional supplements and/or vitamins as prescribed Date Initiated: 06/03/2016 Target Resolution Date: 09/28/2016 Goal Status: Active Interventions: Assess patient nutrition upon admission and as needed per policy Notes: ` Orientation to the Wound Care Program Nursing Diagnoses: Knowledge deficit related to the wound healing center program Goals: Patient/caregiver will verbalize understanding of the [place] Date Initiated: 06/03/2016 Target Resolution Date: 06/29/2016 Goal Status: Active Interventions: Provide education on orientation to the wound center Notes: ` Pain, Acute or Chronic Nursing Diagnoses: [name], [name] ([id number]) Pain, acute or chronic: actual or potential Potential alteration in comfort, pain Goals: Patient/caregiver will verbalize adequate pain control between visits Date Initiated: 06/03/2016 Target Resolution Date: 08/31/2016 Goal Status: Active Interventions: Assess comfort goal upon admission Complete pain assessment as per visit requirements Notes: ` Wound/Skin Impairment Nursing Diagnoses: Impaired tissue integrity Knowledge deficit related to ulceration/compromised skin integrity Goals: Ulcer/skin breakdown will have a volume reduction of 80% by week 12 Date Initiated: 06/03/2016 Target Resolution Date: 09/21/2016 Goal Status: Active Interventions: Assess patient/caregiver ability to perform ulcer/skin care regimen upon admission and as needed Assess ulceration(s) every visit Notes: Electronic Signature(s) Signed: 07/04/2016 4:33:22 PM By: Carolyne Fiscal,  Debra Entered By: Alric Quan on 07/04/2016 08:27:33 [name], [name] ([id number]) -------------------------------------------------------------------------------- Pain Assessment Details Patient Name: [name], [name]. Date of Service: 07/04/2016 8:00 AM Medical Record Number:  [id number] Patient Account Number: [id number] Date of Birth/Sex: 13-May-1960 (56 y.o. Male) Treating RN: Ahmed Prima Primary Care Timohty Renbarger: [name] Other Clinician: Referring Jorey Dollard: [name] Treating Randell Detter/Extender: Frann Rider in Treatment: 4 Active Problems Location of Pain Severity and Description of Pain Patient Has Paino No Site Locations With Dressing Change: No Pain Management and Medication Current Pain Management: Electronic Signature(s) Signed: 07/04/2016 4:33:22 PM By: Alric Quan Entered By: Alric Quan on 07/04/2016 08:08:43 [name] ([id number]) -------------------------------------------------------------------------------- Patient/Caregiver Education Details Patient Name: [name]. Date of Service: 07/04/2016 8:00 AM Medical Record Number: [id number] Patient Account Number: [id number] Date of Birth/Gender: 05/11/1960 (56 y.o. Male) Treating RN: Ahmed Prima Primary Care Physician: [name] Other Clinician: Referring Physician: [name] Treating Physician/Extender: Frann Rider in Treatment: 4 Education Assessment Education Provided To: Patient Education Topics Provided Wound/Skin Impairment: Handouts: Other: keep wrap dry Methods: Demonstration, Explain/Verbal Responses: State content correctly Electronic Signature(s) Signed: 07/04/2016 4:33:22 PM By: Alric Quan Entered By: Alric Quan on 07/04/2016 08:28:44 [name] ([id number]) -------------------------------------------------------------------------------- Wound Assessment Details Patient Name: [name] F. Date of Service: 07/04/2016 8:00 AM Medical Record Number: [id number] Patient Account Number: [id number] Date of Birth/Sex: 05-16-1960 (56 y.o. Male) Treating RN: Ahmed Prima Primary Care Yue Glasheen: [name] Other Clinician: Referring Mohd Clemons: [name] Treating Triston Lisanti/Extender:  Frann Rider in Treatment: 4 Wound Status Wound Number: 1 Primary Diabetic Wound/Ulcer of the Lower Etiology: Extremity Wound Location: Left Lower Leg - Anterior Wound Open Wounding Event: Trauma Status: Date Acquired: 10/11/2015 Comorbid Cataracts, Asthma, Sleep Apnea, Weeks Of Treatment: 4 History: Congestive Heart Failure, Coronary Clustered Wound: No Artery Disease, Hypertension, Type II Diabetes, End Stage Renal Disease, Gout Photos Photo Uploaded By: Alric Quan on 07/04/2016 16:02:40 Wound Measurements Length: (cm) 0.1 Width: (cm) 0.1 Depth: (cm) 0.1 Area: (cm) 0.008 Volume: (cm) 0.001 % Reduction in Area: 99.8% % Reduction in Volume: 99.7% Epithelialization: None Tunneling: No Undermining: No Wound Description Classification: Grade 2 Foul Odor Afte Wound Margin: Distinct, outline attached Slough/Fibrino Exudate Amount: Large Exudate Type: Serosanguineous Exudate Color: red, brown r Cleansing: No No Wound Bed Granulation Amount: None Present (0%) Exposed Structure Necrotic Amount: Large (67-100%) Fascia Exposed: No [name], [name] ([id number]) Necrotic Quality: Eschar Fat Layer (Subcutaneous Tissue) Exposed: No Tendon Exposed: No Muscle Exposed: No Joint Exposed: No Bone Exposed: No Limited to Skin Breakdown Periwound Skin Texture Texture Color No Abnormalities Noted: No No Abnormalities Noted: No Moisture Temperature / Pain No Abnormalities Noted: No Temperature: No Abnormality Dry / Scaly: Yes Tenderness on Palpation: Yes Wound Preparation Ulcer Cleansing: Rinsed/Irrigated with Saline Topical Anesthetic Applied: Other: lidocaine 4%, Treatment Notes Wound #1 (Left, Anterior Lower Leg) 1. Cleansed with: Clean wound with Normal Saline Cleanse wound with antibacterial soap and water 2. Anesthetic Topical Lidocaine 4% cream to wound bed prior to debridement 4. Dressing Applied: Other dressing (specify in notes) 7. Secured  with Tape 4-Layer Compression System - Left Lower Extremity Notes unna to anchor, restore foam Electronic Signature(s) Signed: 07/04/2016 4:33:22 PM By: Alric Quan Entered By: Alric Quan on 07/04/2016 08:23:44 [name], [name] ([id number]) -------------------------------------------------------------------------------- Vitals Details Patient Name: [name]. Date of Service: 07/04/2016 8:00 AM Medical Record Number: [id number] Patient Account Number: [id number] Date of Birth/Sex: 29-Jul-1960 (56 y.o. Male) Treating RN: Ahmed Prima Primary Care Lea Baine: [name] Other  Clinician: Referring Shaneya Taketa: [name] Treating Areen Trautner/Extender: Frann Rider in Treatment: 4 Vital Signs Time Taken: 08:08 Temperature (F): 97.6 Height (in): 70 Pulse (bpm): 55 Weight (lbs): 179 Respiratory Rate (breaths/min): 20 Body Mass Index (BMI): 25.7 Blood Pressure (mmHg): 144/71 Reference Range: 80 - 120 mg / dl Electronic Signature(s) Signed: 07/04/2016 4:33:22 PM By: Alric Quan Entered By: Alric Quan on 07/04/2016 08:10:36

## 2016-07-06 NOTE — Progress Notes (Signed)
Carl, [name] ([id number]) Visit Report for 07/04/2016 Chief Complaint Document Details Patient Name: [name], [name]. Date of Service: 07/04/2016 8:00 AM Medical Record Number: [id number] Patient Account Number: [id number] Date of Birth/Sex: 06-26-1960 (56 y.o. Male) Treating RN: Ahmed Prima Primary Care Provider: [name] Other Clinician: Referring Provider: [name] Treating Provider/Extender: Frann Rider in Treatment: 4 Information Obtained from: Patient Chief Complaint Patient presents for treatment of an open ulcer due to venous insufficiency to the left lower extremity one on the anterior shin which has been there for about 8 months and one on the lower medial calf which has been there for about 2 weeks Electronic Signature(s) Signed: 07/04/2016 8:49:11 AM By: Christin Fudge MD, FACS Entered By: Christin Fudge on 07/04/2016 08:49:11 [name] -------------------------------------------------------------------------------- Debridement Details Patient Name: [name]. Date of Service: 07/04/2016 8:00 AM Medical Record Number: [id number] Patient Account Number: [id number] Date of Birth/Sex: March 30, 1960 (56 y.o. Male) Treating RN: Ahmed Prima Primary Care Provider: [name] Other Clinician: Referring Provider: [name] Treating Provider/Extender: Frann Rider in Treatment: 4 Debridement Performed for Wound #1 Left,Anterior Lower Leg Assessment: Performed By: Physician Christin Fudge, MD Debridement: Debridement Pre-procedure Yes - 08:24 Verification/Time Out Taken: Start Time: 08:25 Pain Control: Lidocaine 4% Topical Solution Level: Skin/Subcutaneous Tissue Total Area Debrided (L x 0.1 (cm) x 0.1 (cm) = 0.01 (cm) W): Tissue and other Viable, Non-Viable, Exudate, Fibrin/Slough, Subcutaneous material debrided: Instrument: Forceps Bleeding: Minimum Hemostasis Achieved: Pressure End Time:  08:26 Procedural Pain: 0 Post Procedural Pain: 0 Response to Treatment: Procedure was tolerated well Post Debridement Measurements of Total Wound Length: (cm) 0.2 Width: (cm) 0.2 Depth: (cm) 0.1 Volume: (cm) 0.003 Character of Wound/Ulcer Post Requires Further Debridement Debridement: Severity of Tissue Post Debridement: Fat layer exposed Post Procedure Diagnosis Same as Pre-procedure Electronic Signature(s) Signed: 07/04/2016 8:49:05 AM By: Christin Fudge MD, FACS Signed: 07/04/2016 4:33:22 PM By: Alric Quan Entered By: Christin Fudge on 07/04/2016 08:49:05 VIRAT, PRATHER ([id number] Sedler, Dorothea Glassman ([id number]) -------------------------------------------------------------------------------- HPI Details Patient Name: Carl, [name]. Date of Service: 07/04/2016 8:00 AM Medical Record Number: [id number] Patient Account Number: [id number] Date of Birth/Sex: 03-24-1961 (56 y.o. Male) Treating RN: Ahmed Prima Primary Care Provider: [name] Other Clinician: Referring Provider: [name] Treating Provider/Extender: Frann Rider in Treatment: 4 History of Present Illness Location: left anterior shin and left medial calf Quality: Patient reports experiencing a dull pain to affected area(s). Severity: Patient states wound are getting worse. Duration: Patient has had the wound for > 3 months prior to seeking treatment at the wound center Timing: Pain in wound is Intermittent (comes and goes Context: The wound appeared gradually over time Modifying Factors: Other treatment(s) tried include:had surgery on the left anterior shin for a hematoma which has been nonhealing for the last 8 months Associated Signs and Symptoms: Patient reports having increase swelling. HPI Description: 56 year old diabetic was last hemoglobin A1c was 7.2% has been reviewed today with 2 ulcerated areas in his left lower extremity which she's had for about 8 months and the one most  recently was started as a blister for 2 weeks. he has a past medical history of atrial fibrillation, coronary artery disease, CHF, chronic venous stasis dermatitis, diabetes mellitus, hypertension, sleep apnea and is status post kidney transplant in 2012. He is a former smoker and quit cigars in 1996. In the remote past he's had some arterial and venous duplex studies done but does not recall the results no was ever treated for  venous insufficiency. 06/10/2016 -- -- had a office visit at [facility] seen by the physicianos assistant [facility]. Patient underwent an ABI which showed medial calcification, normal tibial artery Doppler waveform, PPG waveforms and toe brachial indices suggest normal arterial perfusion to bilateral lower extremities. right TBI was 0.97 and left was 0.84 Bilateral venous duplex was notable for venous reflux in the left greater saphenous vein and popliteal veins.. A left lower extremity endovenous laser ablation was recommended. 06/27/2016 -- I understand he spoke to his insurance company and there is a 3 month waiting period with conservative therapy prior to authorizing surgical intervention. Electronic Signature(s) Signed: 07/04/2016 8:49:16 AM By: Christin Fudge MD, FACS Entered By: Christin Fudge on 07/04/2016 08:49:16 DERRIEN, ANSCHUTZ ([id number]) -------------------------------------------------------------------------------- Physical Exam Details Patient Name: [name], Carl. Date of Service: 07/04/2016 8:00 AM Medical Record Number: [id number] Patient Account Number: [id number] Date of Birth/Sex: March 11, 1961 (56 y.o. Male) Treating RN: Ahmed Prima Primary Care Provider: [name] Other Clinician: Referring Provider: [name] Treating Provider/Extender: Frann Rider in Treatment: 4 Constitutional . Pulse regular. Respirations normal and unlabored. Afebrile. . Eyes Nonicteric. Reactive to light. Ears, Nose,  Mouth, and Throat Lips, teeth, and gums WNL.Marland Kitchen Moist mucosa without lesions. Neck supple and nontender. No palpable supraclavicular or cervical adenopathy. Normal sized without goiter. Respiratory WNL. No retractions.. Breath sounds WNL, No rubs, rales, rhonchi, or wheeze.. Cardiovascular Heart rhythm and rate regular, no murmur or gallop.. Pedal Pulses WNL. No clubbing, cyanosis or edema. Chest Breasts symmetical and no nipple discharge.. Breast tissue WNL, no masses, lumps, or tenderness.. Lymphatic No adneopathy. No adenopathy. No adenopathy. Musculoskeletal Adexa without tenderness or enlargement.. Digits and nails w/o clubbing, cyanosis, infection, petechiae, ischemia, or inflammatory conditions.. Integumentary (Hair, Skin) No suspicious lesions. No crepitus or fluctuance. No peri-wound warmth or erythema. No masses.Marland Kitchen Psychiatric Judgement and insight Intact.. No evidence of depression, anxiety, or agitation.. Notes the lymphedema has gone down very nicely and his wound had a lot of adherent eschar and exudate and some subcutaneous debris which have sharply removed and bleeding controlled with pressure. Electronic Signature(s) Signed: 07/04/2016 8:49:41 AM By: Christin Fudge MD, FACS Entered By: Christin Fudge on 07/04/2016 08:49:41 [name] ([id number]) -------------------------------------------------------------------------------- Physician Orders Details Patient Name: TREYSHAWN, MULDREW. Date of Service: 07/04/2016 8:00 AM Medical Record Number: [id number] Patient Account Number: [id number] Date of Birth/Sex: 1960-10-26 (55 y.o. Male) Treating RN: Ahmed Prima Primary Care Provider: [name] Other Clinician: Referring Provider: [name] Treating Provider/Extender: Frann Rider in Treatment: 4 Verbal / Phone Orders: Yes Clinician: Carolyne Fiscal, Debi Read Back and Verified: Yes Diagnosis Coding Wound Cleansing Wound #1 Left,Anterior Lower Leg o  Clean wound with Normal Saline. o Cleanse wound with mild soap and water o May Shower, gently pat wound dry prior to applying new dressing. Anesthetic Wound #1 Left,Anterior Lower Leg o Topical Lidocaine 4% cream applied to wound bed prior to debridement Primary Wound Dressing Wound #1 Left,Anterior Lower Leg o Foam - Restore Dressing Change Frequency Wound #1 Left,Anterior Lower Leg o Change dressing every week Follow-up Appointments Wound #1 Left,Anterior Lower Leg o Return Appointment in 1 week. Edema Control Wound #1 Left,Anterior Lower Leg o 4-Layer Compression System - Left Lower Extremity - unna to anchor o Elevate legs to the level of the heart and pump ankles as often as possible Additional Orders / Instructions Wound #1 Left,Anterior Lower Leg o Increase protein intake. Electronic Signature(s) Signed: 07/04/2016 2:17:24 PM By: Christin Fudge MD, FACS Signed: 07/04/2016  4:33:22 PM By: Rocky Crafts, Dorothea Glassman ([id number]) Entered By: Alric Quan on 07/04/2016 08:27:23 [name] ([id number]) -------------------------------------------------------------------------------- Problem List Details Patient Name: ASTON, LIESKE F. Date of Service: 07/04/2016 8:00 AM Medical Record Number: [id number] Patient Account Number: [id number] Date of Birth/Sex: 1960-09-12 (56 y.o. Male) Treating RN: Ahmed Prima Primary Care Provider: [name] Other Clinician: Referring Provider: [name] Treating Provider/Extender: Frann Rider in Treatment: 4 Active Problems ICD-10 Encounter Code Description Active Date Diagnosis E11.622 Type 2 diabetes mellitus with other skin ulcer 06/03/2016 Yes I89.0 Lymphedema, not elsewhere classified 06/03/2016 Yes L97.222 Non-pressure chronic ulcer of left calf with fat layer 06/03/2016 Yes exposed I87.312 Chronic venous hypertension (idiopathic) with ulcer of left 06/03/2016 Yes lower  extremity Z94.0 Kidney transplant status 06/03/2016 Yes Inactive Problems Resolved Problems Electronic Signature(s) Signed: 07/04/2016 8:48:52 AM By: Christin Fudge MD, FACS Entered By: Christin Fudge on 07/04/2016 08:48:51 Shakir, Dorothea Glassman ([id number]) -------------------------------------------------------------------------------- Progress Note Details Patient Name: [name] Date of Service: 07/04/2016 8:00 AM Medical Record Number: [id number] Patient Account Number: [id number] Date of Birth/Sex: 1960/06/12 (56 y.o. Male) Treating RN: Ahmed Prima Primary Care Provider: [name] Other Clinician: Referring Provider: [name] Treating Provider/Extender: Frann Rider in Treatment: 4 Subjective Chief Complaint Information obtained from Patient Patient presents for treatment of an open ulcer due to venous insufficiency to the left lower extremity one on the anterior shin which has been there for about 8 months and one on the lower medial calf which has been there for about 2 weeks History of Present Illness (HPI) The following HPI elements were documented for the patient's wound: Location: left anterior shin and left medial calf Quality: Patient reports experiencing a dull pain to affected area(s). Severity: Patient states wound are getting worse. Duration: Patient has had the wound for > 3 months prior to seeking treatment at the wound center Timing: Pain in wound is Intermittent (comes and goes Context: The wound appeared gradually over time Modifying Factors: Other treatment(s) tried include:had surgery on the left anterior shin for a hematoma which has been nonhealing for the last 8 months Associated Signs and Symptoms: Patient reports having increase swelling. 56 year old diabetic was last hemoglobin A1c was 7.2% has been reviewed today with 2 ulcerated areas in his left lower extremity which she's had for about 8 months and the one most recently was  started as a blister for 2 weeks. he has a past medical history of atrial fibrillation, coronary artery disease, CHF, chronic venous stasis dermatitis, diabetes mellitus, hypertension, sleep apnea and is status post kidney transplant in 2012. He is a former smoker and quit cigars in 1996. In the remote past he's had some arterial and venous duplex studies done but does not recall the results no was ever treated for venous insufficiency. 06/10/2016 -- -- had a office visit at [facility] seen by the physician s assistant Marcelle Overlie. Patient underwent an ABI which showed medial calcification, normal tibial artery Doppler waveform, PPG waveforms and toe brachial indices suggest normal arterial perfusion to bilateral lower extremities. right TBI was 0.97 and left was 0.84 Bilateral venous duplex was notable for venous reflux in the left greater saphenous vein and popliteal veins.. A left lower extremity endovenous laser ablation was recommended. 06/27/2016 -- I understand he spoke to his insurance company and there is a 3 month waiting period with conservative therapy prior to authorizing surgical intervention. ALEK, BORGES F. ([id number]) Objective Constitutional Pulse regular. Respirations normal and unlabored. Afebrile. Vitals  Time Taken: 8:08 AM, Height: 70 in, Weight: 179 lbs, BMI: 25.7, Temperature: 97.6 F, Pulse: 55 bpm, Respiratory Rate: 20 breaths/min, Blood Pressure: 144/71 mmHg. Eyes Nonicteric. Reactive to light. Ears, Nose, Mouth, and Throat Lips, teeth, and gums WNL.Marland Kitchen Moist mucosa without lesions. Neck supple and nontender. No palpable supraclavicular or cervical adenopathy. Normal sized without goiter. Respiratory WNL. No retractions.. Breath sounds WNL, No rubs, rales, rhonchi, or wheeze.. Cardiovascular Heart rhythm and rate regular, no murmur or gallop.. Pedal Pulses WNL. No clubbing, cyanosis or edema. Chest Breasts symmetical and no nipple  discharge.. Breast tissue WNL, no masses, lumps, or tenderness.. Lymphatic No adneopathy. No adenopathy. No adenopathy. Musculoskeletal Adexa without tenderness or enlargement.. Digits and nails w/o clubbing, cyanosis, infection, petechiae, ischemia, or inflammatory conditions.Marland Kitchen Psychiatric Judgement and insight Intact.. No evidence of depression, anxiety, or agitation.. General Notes: the lymphedema has gone down very nicely and his wound had a lot of adherent eschar and exudate and some subcutaneous debris which have sharply removed and bleeding controlled with pressure. Integumentary (Hair, Skin) No suspicious lesions. No crepitus or fluctuance. No peri-wound warmth or erythema. No masses.. Wound #1 status is Open. Original cause of wound was Trauma. The wound is located on the Left,Anterior Lower Leg. The wound measures 0.1cm length x 0.1cm width x 0.1cm depth; 0.008cm^2 area and Leggio, Demitrios F. ([id number]) 0.001cm^3 volume. The wound is limited to skin breakdown. There is no tunneling or undermining noted. There is a large amount of serosanguineous drainage noted. The wound margin is distinct with the outline attached to the wound base. There is no granulation within the wound bed. There is a large (67-100%) amount of necrotic tissue within the wound bed including Eschar. The periwound skin appearance exhibited: Dry/Scaly. Periwound temperature was noted as No Abnormality. The periwound has tenderness on palpation. Assessment Active Problems ICD-10 E11.622 - Type 2 diabetes mellitus with other skin ulcer I89.0 - Lymphedema, not elsewhere classified L97.222 - Non-pressure chronic ulcer of left calf with fat layer exposed I87.312 - Chronic venous hypertension (idiopathic) with ulcer of left lower extremity Z94.0 - Kidney transplant status Procedures Wound #1 Wound #1 is a Diabetic Wound/Ulcer of the Lower Extremity located on the Left,Anterior Lower Leg . There was a  Skin/Subcutaneous Tissue Debridement ([id number]) debridement with total area of 0.01 sq cm performed by Christin Fudge, MD. with the following instrument(s): Forceps to remove Viable and Non-Viable tissue/material including Exudate, Fibrin/Slough, and Subcutaneous after achieving pain control using Lidocaine 4% Topical Solution. A time out was conducted at 08:24, prior to the start of the procedure. A Minimum amount of bleeding was controlled with Pressure. The procedure was tolerated well with a pain level of 0 throughout and a pain level of 0 following the procedure. Post Debridement Measurements: 0.2cm length x 0.2cm width x 0.1cm depth; 0.003cm^3 volume. Character of Wound/Ulcer Post Debridement requires further debridement. Severity of Tissue Post Debridement is: Fat layer exposed. Post procedure Diagnosis Wound #1: Same as Pre-Procedure Plan Wound Cleansing: ATHEL, MERRIWEATHER F. ([id number]) Wound #1 Left,Anterior Lower Leg: Clean wound with Normal Saline. Cleanse wound with mild soap and water May Shower, gently pat wound dry prior to applying new dressing. Anesthetic: Wound #1 Left,Anterior Lower Leg: Topical Lidocaine 4% cream applied to wound bed prior to debridement Primary Wound Dressing: Wound #1 Left,Anterior Lower Leg: Foam - Restore Dressing Change Frequency: Wound #1 Left,Anterior Lower Leg: Change dressing every week Follow-up Appointments: Wound #1 Left,Anterior Lower Leg: Return Appointment in 1 week. Edema Control: Wound #1 Left,Anterior  Lower Leg: 4-Layer Compression System - Left Lower Extremity - unna to anchor Elevate legs to the level of the heart and pump ankles as often as possible Additional Orders / Instructions: Wound #1 Left,Anterior Lower Leg: Increase protein intake. I have recommended: 1. Foam with a 4 layer Profore 2. Elevation and exercise 3. Await his venous ablation -- will probably be 3 months before he gets his surgery 4. He has already got  compression stockings from [place] and asked him to get them every time he visits [name] 5. continue good control of his diabetes mellitus 6. Regular visits to the wound center - I anticipate discharge soon Electronic Signature(s) Signed: 07/04/2016 8:50:28 AM By: Christin Fudge MD, FACS Entered By: Christin Fudge on 07/04/2016 08:50:28 AC, COLAN ([id number]) -------------------------------------------------------------------------------- SuperBill Details Patient Name: [name]. Date of Service: 07/04/2016 Medical Record Number: [id number] Patient Account Number: [id number] Date of Birth/Sex: Apr 12, 1960 (56 y.o. Male) Treating RN: Ahmed Prima Primary Care Provider: [name] Other Clinician: Referring Provider: [name] Treating Provider/Extender: Frann Rider in Treatment: 4 Diagnosis Coding ICD-10 Codes Code Description [phone] Type 2 diabetes mellitus with other skin ulcer I89.0 Lymphedema, not elsewhere classified L97.222 Non-pressure chronic ulcer of left calf with fat layer exposed I87.312 Chronic venous hypertension (idiopathic) with ulcer of left lower extremity Z94.0 Kidney transplant status Facility Procedures CPT4 Code Description: [id number] - DEB SUBQ TISSUE 20 SQ CM/< ICD-10 Description Diagnosis E11.622 Type 2 diabetes mellitus with other skin ulcer I89.0 Lymphedema, not elsewhere classified L97.222 Non-pressure chronic ulcer of left calf with fat lay  I87.312 Chronic venous hypertension (idiopathic) with ulcer Modifier: er exposed of left lower Quantity: 1 extremity Physician Procedures CPT4 Code Description: [id number] - WC PHYS SUBQ TISS 20 SQ CM ICD-10 Description Diagnosis E11.622 Type 2 diabetes mellitus with other skin ulcer I89.0 Lymphedema, not elsewhere classified L97.222 Non-pressure chronic ulcer of left calf with fat lay  I87.312 Chronic venous hypertension (idiopathic) with ulcer Modifier: er exposed of left  lower Quantity: 1 extremity Electronic Signature(s) Signed: 07/04/2016 8:50:44 AM By: Christin Fudge MD, FACS Entered By: Christin Fudge on 07/04/2016 08:50:43

## 2016-07-08 ENCOUNTER — Other Ambulatory Visit: Payer: Self-pay | Admitting: Cardiovascular Disease

## 2016-07-11 ENCOUNTER — Ambulatory Visit: Payer: 59 | Admitting: Surgery

## 2016-07-12 ENCOUNTER — Encounter: Payer: 59 | Admitting: Nurse Practitioner

## 2016-07-12 DIAGNOSIS — L97829 Non-pressure chronic ulcer of other part of left lower leg with unspecified severity: Secondary | ICD-10-CM | POA: Diagnosis not present

## 2016-07-12 DIAGNOSIS — I87312 Chronic venous hypertension (idiopathic) with ulcer of left lower extremity: Secondary | ICD-10-CM | POA: Diagnosis not present

## 2016-07-12 DIAGNOSIS — I890 Lymphedema, not elsewhere classified: Secondary | ICD-10-CM | POA: Diagnosis not present

## 2016-07-12 DIAGNOSIS — I4891 Unspecified atrial fibrillation: Secondary | ICD-10-CM | POA: Diagnosis not present

## 2016-07-12 DIAGNOSIS — L97222 Non-pressure chronic ulcer of left calf with fat layer exposed: Secondary | ICD-10-CM | POA: Diagnosis not present

## 2016-07-12 DIAGNOSIS — I509 Heart failure, unspecified: Secondary | ICD-10-CM | POA: Diagnosis not present

## 2016-07-12 DIAGNOSIS — E11622 Type 2 diabetes mellitus with other skin ulcer: Secondary | ICD-10-CM | POA: Diagnosis not present

## 2016-07-12 DIAGNOSIS — Z940 Kidney transplant status: Secondary | ICD-10-CM | POA: Diagnosis not present

## 2016-07-12 DIAGNOSIS — I132 Hypertensive heart and chronic kidney disease with heart failure and with stage 5 chronic kidney disease, or end stage renal disease: Secondary | ICD-10-CM | POA: Diagnosis not present

## 2016-07-12 DIAGNOSIS — I2510 Atherosclerotic heart disease of native coronary artery without angina pectoris: Secondary | ICD-10-CM | POA: Diagnosis not present

## 2016-07-14 NOTE — Progress Notes (Signed)
JADORE, VEALS ([id number]) Visit Report for 07/12/2016 Chief Complaint Document Details Patient Name: [name], [name]. Date of Service: 07/12/2016 8:45 AM Medical Record Number: [id number] Patient Account Number: [id number] Date of Birth/Sex: 1960/12/31 (56 y.o. Male) Treating RN: Ahmed Prima Primary Care Provider: [name] Other Clinician: Referring Provider: [name] Treating Provider/Extender: Cathie Olden in Treatment: 5 Information Obtained from: Patient Chief Complaint patient is here for follow-up evaluation of a left lower extremity ulcer Electronic Signature(s) Signed: 07/12/2016 9:09:00 AM By: Lawanda Cousins Entered By: Lawanda Cousins on 07/12/2016 09:09:00 [name] ([id number]) -------------------------------------------------------------------------------- HPI Details Patient Name: [name]. Date of Service: 07/12/2016 8:45 AM Medical Record Number: [id number] Patient Account Number: [id number] Date of Birth/Sex: Dec 12, 1960 (56 y.o. Male) Treating RN: Ahmed Prima Primary Care Provider: [name] Other Clinician: Referring Provider: [name] Treating Provider/Extender: Cathie Olden in Treatment: 5 History of Present Illness Location: left anterior shin and left medial calf Quality: Patient reports experiencing a dull pain to affected area(s). Severity: Patient states wound are getting worse. Duration: Patient has had the wound for > 3 months prior to seeking treatment at the wound center Timing: Pain in wound is Intermittent (comes and goes Context: The wound appeared gradually over time Modifying Factors: Other treatment(s) tried include:had surgery on the left anterior shin for a hematoma which has been nonhealing for the last 8 months Associated Signs and Symptoms: Patient reports having increase swelling. HPI Description: 56 year old diabetic was last hemoglobin A1c was 7.2% has been reviewed today with  2 ulcerated areas in his left lower extremity which she's had for about 8 months and the one most recently was started as a blister for 2 weeks. he has a past medical history of atrial fibrillation, coronary artery disease, CHF, chronic venous stasis dermatitis, diabetes mellitus, hypertension, sleep apnea and is status post kidney transplant in 2012. He is a former smoker and quit cigars in 1996. In the remote past he's had some arterial and venous duplex studies done but does not recall the results no was ever treated for venous insufficiency. 06/10/2016 -- -- had a office visit at [facility] seen by the physicianos assistant [facility]. Patient underwent an ABI which showed medial calcification, normal tibial artery Doppler waveform, PPG waveforms and toe brachial indices suggest normal arterial perfusion to bilateral lower extremities. right TBI was 0.97 and left was 0.84 Bilateral venous duplex was notable for venous reflux in the left greater saphenous vein and popliteal veins.. A left lower extremity endovenous laser ablation was recommended. 06/27/2016 -- I understand he spoke to his insurance company and there is a 3 month waiting period with conservative therapy prior to authorizing surgical intervention. 07/12/16- he is here for follow-up evaluation of a left lower extremity ulcer. He did bring his compression stockings today. He is voicing no complaints or concerns Electronic Signature(s) Signed: 07/12/2016 9:09:25 AM By: Lawanda Cousins Entered By: Lawanda Cousins on 07/12/2016 09:09:25 ESSAM, LOWDERMILK ([id number]) -------------------------------------------------------------------------------- Physical Exam Details Patient Name: [name]. Date of Service: 07/12/2016 8:45 AM Medical Record Number: [id number] Patient Account Number: [id number] Date of Birth/Sex: [birth year] (56 y.o. Male) Treating RN: Ahmed Prima Primary Care Provider: [name] Other Clinician: Referring Provider: [name] Treating Provider/Extender: Cathie Olden in Treatment: 5 Musculoskeletal ambulated without assistance; steady gait. Integumentary (Hair, Skin) LLE- anterior aspect ulcer is completely epithelialized. Psychiatric appears to make sound judgement and have accurate insight regarding healthcare. oriented to time, place, person and situation. calm,  pleasant, conversive. Electronic Signature(s) Signed: 07/12/2016 9:09:52 AM By: Lawanda Cousins Entered By: Lawanda Cousins on 07/12/2016 09:09:52 [name] ([id number]) -------------------------------------------------------------------------------- Physician Orders Details Patient Name: [name]. Date of Service: 07/12/2016 8:45 AM Medical Record Number: [id number] Patient Account Number: [id number] Date of Birth/Sex: 1960/08/15 (56 y.o. Male) Treating RN: Ahmed Prima Primary Care Provider: [name] Other Clinician: Referring Provider: [name] Treating Provider/Extender: Cathie Olden in Treatment: 5 Verbal / Phone Orders: Yes Clinician: Carolyne Fiscal, Debi Read Back and Verified: Yes Diagnosis Coding Discharge From [facility] Services o Discharge from [place] you compression stockings everyday. Please call our office if you have any questions or concerns. Electronic Signature(s) Signed: 07/12/2016 2:28:52 PM By: Lawanda Cousins Signed: 07/12/2016 4:12:19 PM By: Alric Quan Entered By: Alric Quan on 07/12/2016 09:08:33 [name] ([id number]) -------------------------------------------------------------------------------- Problem List Details Patient Name: [name]. Date of Service: 07/12/2016 8:45 AM Medical Record Number: [id number] Patient Account Number: [id number] Date of Birth/Sex: [birth year] (56 y.o. Male) Treating RN: Ahmed Prima Primary Care Provider: [name] Other Clinician: Referring  Provider: [name] Treating Provider/Extender: Cathie Olden in Treatment: 5 Active Problems ICD-10 Encounter Code Description Active Date Diagnosis E11.622 Type 2 diabetes mellitus with other skin ulcer 06/03/2016 Yes I89.0 Lymphedema, not elsewhere classified 06/03/2016 Yes L97.222 Non-pressure chronic ulcer of left calf with fat layer 06/03/2016 Yes exposed I87.312 Chronic venous hypertension (idiopathic) with ulcer of left 06/03/2016 Yes lower extremity Z94.0 Kidney transplant status 06/03/2016 Yes Inactive Problems Resolved Problems Electronic Signature(s) Signed: 07/12/2016 9:08:39 AM By: Lawanda Cousins Entered By: Lawanda Cousins on 07/12/2016 09:08:38 [name] ([id number]) -------------------------------------------------------------------------------- Progress Note Details Patient Name: [name] Date of Service: 07/12/2016 8:45 AM Medical Record Number: [id number] Patient Account Number: [id number] Date of Birth/Sex: 07-17-1960 (56 y.o. Male) Treating RN: Ahmed Prima Primary Care Provider: [name] Other Clinician: Referring Provider: [name] Treating Provider/Extender: Cathie Olden in Treatment: 5 Subjective Chief Complaint Information obtained from Patient patient is here for follow-up evaluation of a left lower extremity ulcer History of Present Illness (HPI) The following HPI elements were documented for the patient's wound: Location: left anterior shin and left medial calf Quality: Patient reports experiencing a dull pain to affected area(s). Severity: Patient states wound are getting worse. Duration: Patient has had the wound for > 3 months prior to seeking treatment at the wound center Timing: Pain in wound is Intermittent (comes and goes Context: The wound appeared gradually over time Modifying Factors: Other treatment(s) tried include:had surgery on the left anterior shin for a hematoma which has been nonhealing  for the last 8 months Associated Signs and Symptoms: Patient reports having increase swelling. 56 year old diabetic was last hemoglobin A1c was 7.2% has been reviewed today with 2 ulcerated areas in his left lower extremity which she's had for about 8 months and the one most recently was started as a blister for 2 weeks. he has a past medical history of atrial fibrillation, coronary artery disease, CHF, chronic venous stasis dermatitis, diabetes mellitus, hypertension, sleep apnea and is status post kidney transplant in 2012. He is a former smoker and quit cigars in 1996. In the remote past he's had some arterial and venous duplex studies done but does not recall the results no was ever treated for venous insufficiency. 06/10/2016 -- -- had a office visit at [facility] seen by the physician s assistant Marcelle Overlie. Patient underwent an ABI which showed medial calcification, normal tibial artery Doppler  waveform, PPG waveforms and toe brachial indices suggest normal arterial perfusion to bilateral lower extremities. right TBI was 0.97 and left was 0.84 Bilateral venous duplex was notable for venous reflux in the left greater saphenous vein and popliteal veins.. A left lower extremity endovenous laser ablation was recommended. 06/27/2016 -- I understand he spoke to his insurance company and there is a 3 month waiting period with conservative therapy prior to authorizing surgical intervention. 07/12/16- he is here for follow-up evaluation of a left lower extremity ulcer. He did bring his compression stockings today. He is voicing no complaints or concerns LAMON, ROTUNDO. ([id number]) Objective Constitutional Vitals Time Taken: 8:48 AM, Height: 70 in, Weight: 179 lbs, BMI: 25.7, Temperature: 97.5 F, Pulse: 59 bpm, Respiratory Rate: 20 breaths/min, Blood Pressure: 155/81 mmHg. Musculoskeletal ambulated without assistance; steady gait. Psychiatric appears to make sound  judgement and have accurate insight regarding healthcare. oriented to time, place, person and situation. calm, pleasant, conversive. Integumentary (Hair, Skin) LLE- anterior aspect ulcer is completely epithelialized. Wound #1 status is Open. Original cause of wound was Trauma. The wound is located on the Left,Anterior Lower Leg. The wound measures 0cm length x 0cm width x 0cm depth; 0cm^2 area and 0cm^3 volume. The wound is limited to skin breakdown. There is no tunneling or undermining noted. There is a none present amount of drainage noted. The wound margin is distinct with the outline attached to the wound base. There is no granulation within the wound bed. There is no necrotic tissue within the wound bed. The periwound skin appearance did not exhibit: Dry/Scaly. Periwound temperature was noted as No Abnormality. Assessment Active Problems ICD-10 E11.622 - Type 2 diabetes mellitus with other skin ulcer I89.0 - Lymphedema, not elsewhere classified L97.222 - Non-pressure chronic ulcer of left calf with fat layer exposed I87.312 - Chronic venous hypertension (idiopathic) with ulcer of left lower extremity Z94.0 - Kidney transplant status AJENE, CARCHI. ([id number]) - his left lower extremity ulcer is completely epithelialized. He did bring his compression stockings today and will be discharged from the wound care center. He has been advised to keep this area covered with a foam border, that we will place today, until Monday. He was informed to assess for any type of drainage. - We discussed the need for continued use with compression stockings; on first thing in the morning, off prior to going to bed Plan Discharge From [facility] Services: Discharge from [place] you compression stockings everyday. Please call our office if you have any questions or concerns. 1. Foam border, to be removed on Monday 2. Compression stockings-on in a.m., off in p.m. 3. May discharge from the  wound care center-healed Electronic Signature(s) Signed: 07/12/2016 9:12:02 AM By: Lawanda Cousins Entered By: Lawanda Cousins on 07/12/2016 09:12:02 [name] ([id number]) -------------------------------------------------------------------------------- [place] Details Patient Name: [name]. Date of Service: 07/12/2016 Medical Record Number: [id number] Patient Account Number: [id number] Date of Birth/Sex: Jul 03, 1960 (56 y.o. Male) Treating RN: Carolyne Fiscal, Debi Primary Care Provider: [name] Other Clinician: Referring Provider: [name] Treating Provider/Extender: Cathie Olden in Treatment: 5 Diagnosis Coding ICD-10 Codes Code Description [phone] Type 2 diabetes mellitus with other skin ulcer I89.0 Lymphedema, not elsewhere classified L97.222 Non-pressure chronic ulcer of left calf with fat layer exposed I87.312 Chronic venous hypertension (idiopathic) with ulcer of left lower extremity Z94.0 Kidney transplant status Facility Procedures CPT4 Code: [id number] Description: 54650 - WOUND CARE VISIT-LEV 2 EST PT Modifier: Quantity: 1 Physician Procedures CPT4 Code Description: [id number]  59977 - WC PHYS LEVEL 2 - EST PT ICD-10 Description Diagnosis I87.312 Chronic venous hypertension (idiopathic) with ulcer E11.622 Type 2 diabetes mellitus with other skin ulcer Modifier: of left lower Quantity: 1 extremity Electronic Signature(s) Signed: 07/12/2016 2:28:52 PM By: Lawanda Cousins Signed: 07/12/2016 4:12:19 PM By: Alric Quan Previous Signature: 07/12/2016 9:12:22 AM Version By: Lawanda Cousins Entered By: Alric Quan on 07/12/2016 09:26:20

## 2016-07-14 NOTE — Progress Notes (Signed)
[facility], [facility] ([id number]) Visit Report for 07/12/2016 Arrival Information Details Patient Name: [name], [name]. Date of Service: 07/12/2016 8:45 AM Medical Record Number: [id number] Patient Account Number: [id number] Date of Birth/Sex: Sep 28, 1960 (56 y.o. Male) Treating RN: Ahmed Prima Primary Care Duilio Heritage: [name] Other Clinician: Referring Tayshon Winker: [name] Treating Orlandus Borowski/Extender: Cathie Olden in Treatment: 5 Visit Information History Since Last Visit All ordered tests and consults were completed: No Patient Arrived: Ambulatory Added or deleted any medications: No Arrival Time: 08:47 Any new allergies or adverse reactions: No Accompanied By: self Had a fall or experienced change in No Transfer Assistance: EasyPivot activities of daily living that may affect Patient Lift risk of falls: Patient Identification Verified: Yes Signs or symptoms of abuse/neglect since last No Secondary Verification Process Yes visito Completed: Hospitalized since last visit: No Patient Requires Transmission- No Has Dressing in Place as Prescribed: Yes Based Precautions: Has Compression in Place as Prescribed: Yes Patient Has Alerts: Yes Pain Present Now: No Patient Alerts: DM II Eliquis Electronic Signature(s) Signed: 07/12/2016 4:12:19 PM By: Alric Quan Entered By: Alric Quan on 07/12/2016 08:48:30 Walsh, [name] ([id number]) -------------------------------------------------------------------------------- Clinic Level of Care Assessment Details Patient Name: [name]. Date of Service: 07/12/2016 8:45 AM Medical Record Number: [id number] Patient Account Number: [id number] Date of Birth/Sex: 1960-06-18 (56 y.o. Male) Treating RN: Ahmed Prima Primary Care Kaija Kovacevic: [name] Other Clinician: Referring Wilfrid Hyser: [name] Treating Treasure Ingrum/Extender: Cathie Olden in Treatment: 5 Clinic Level of Care Assessment  Items TOOL 4 Quantity Score X - Use when only an EandM is performed on FOLLOW-UP visit 1 0 ASSESSMENTS - Nursing Assessment / Reassessment X - Reassessment of Co-morbidities (includes updates in patient status) 1 10 X - Reassessment of Adherence to Treatment Plan 1 5 ASSESSMENTS - Wound and Skin Assessment / Reassessment X - Simple Wound Assessment / Reassessment - one wound 1 5 _0  - Complex Wound Assessment / Reassessment - multiple wounds 0 _1  - Dermatologic / Skin Assessment (not related to wound area) 0 ASSESSMENTS - Focused Assessment X - Circumferential Edema Measurements - multi extremities 1 5 _2  - Nutritional Assessment / Counseling / Intervention 0 _3  - Lower Extremity Assessment (monofilament, tuning fork, pulses) 0 _4  - Peripheral Arterial Disease Assessment (using hand held doppler) 0 ASSESSMENTS - Ostomy and/or Continence Assessment and Care _5  - Incontinence Assessment and Management 0 _6  - Ostomy Care Assessment and Management (repouching, etc.) 0 PROCESS - Coordination of Care X - Simple Patient / Family Education for ongoing care 1 15 _7  - Complex (extensive) Patient / Family Education for ongoing care 0 _8  - Staff obtains Programmer, systems, Records, Test Results / Process Orders 0 _9  - Staff telephones HHA, Nursing Homes / Clarify orders / etc 0 _10  - Routine Transfer to another Facility (non-emergent condition) 0 [name], [name] ([id number]) _11  - Routine Hospital Admission (non-emergent condition) 0 _12  - New Admissions / Biomedical engineer / Ordering NPWT, Apligraf, etc. 0 _13  - Emergency Hospital Admission (emergent condition) 0 X - Simple Discharge Coordination 1 10 _14  - Complex (extensive) Discharge Coordination 0 PROCESS - Special Needs _15  - Pediatric / Minor Patient Management 0 _16  - Isolation Patient Management 0 _17  - Hearing / Language / Visual special needs 0 _18  - Assessment of Community assistance (transportation, D/C planning, etc.) 0 _19  - Additional  assistance / Altered mentation 0 _20  - Support Surface(s) Assessment (bed, cushion, seat, etc.) 0 INTERVENTIONS - Wound Cleansing / Measurement _21  - Simple Wound Cleansing - one wound 0 _22  -  Complex Wound Cleansing - multiple wounds 0 X - Wound Imaging (photographs - any number of wounds) 1 5 _0  - Wound Tracing (instead of photographs) 0 _1  - Simple Wound Measurement - one wound 0 _2  - Complex Wound Measurement - multiple wounds 0 INTERVENTIONS - Wound Dressings _3  - Small Wound Dressing one or multiple wounds 0 _4  - Medium Wound Dressing one or multiple wounds 0 _5  - Large Wound Dressing one or multiple wounds 0 <ZOXWRUEAVWUJWJXB>_1<\/YNWGNFAOZHYQMVHQ>_4  - Application of Medications - topical 0 <ONGEXBMWUXLKGMWN>_0<\/UVOZDGUYQIHKVQQV>_9  - Application of Medications - injection 0 INTERVENTIONS - Miscellaneous _8  - External ear exam 0 [name], [name] ([id number]) _9  - Specimen Collection (cultures, biopsies, blood, body fluids, etc.) 0 _10  - Specimen(s) / Culture(s) sent or taken to Lab for analysis 0 _11  - Patient Transfer (multiple staff / Harrel Lemon Lift / Similar devices) 0 _12  - Simple Staple / Suture removal (25 or less) 0 _13  - Complex Staple / Suture removal (26 or more) 0 _14  - Hypo / Hyperglycemic Management (close monitor of Blood Glucose) 0 _15  - Ankle / Brachial Index (ABI) - do not check if billed separately 0 X - Vital Signs 1 5 Has the patient been seen at the hospital within the last three years: Yes Total Score: 60 Level Of Care: New/Established - Level 2 Electronic Signature(s) Signed: 07/12/2016 4:12:19 PM By: Alric Quan Entered By: Alric Quan on 07/12/2016 [name] ([id number]) -------------------------------------------------------------------------------- Encounter Discharge Information Details Patient Name: [name]. Date of Service: 07/12/2016 8:45 AM Medical Record Number: [id number] Patient Account Number: [id number] Date of Birth/Sex: 1961-03-24 (56 y.o. Male) Treating RN: Ahmed Prima Primary Care  Rashid Whitenight: [name] Other Clinician: Referring Madolyn Ackroyd: [name] Treating Kurstyn Larios/Extender: Cathie Olden in Treatment: 5 Encounter Discharge Information Items Discharge Pain Level: 0 Discharge Condition: Stable Ambulatory Status: Ambulatory Discharge Destination: Home Transportation: Private Auto Accompanied By: self Schedule Follow-up Appointment: Yes Medication Reconciliation completed and provided to Patient/Care No Criss Bartles: Provided on Clinical Summary of Care: 07/12/2016 Form Type Recipient Paper Patient JG Electronic Signature(s) Signed: 07/12/2016 9:17:16 AM By: Ruthine Dose Entered By: Ruthine Dose on 07/12/2016 09:17:16 Zara, [name] ([id number]) -------------------------------------------------------------------------------- Lower Extremity Assessment Details Patient Name: Carl Speller F. Date of Service: 07/12/2016 8:45 AM Medical Record Number: [id number] Patient Account Number: [id number] Date of Birth/Sex: September 19, 1960 (56 y.o. Male) Treating RN: Ahmed Prima Primary Care Julyana Woolverton: [name] Other Clinician: Referring Capricia Serda: [name] Treating Valkyrie Guardiola/Extender: Cathie Olden in Treatment: 5 Edema Assessment Assessed: [Left: No] [Right: No] E[Left: dema] [Right: :] Calf Left: Right: Point of Measurement: 32 cm From Medial Instep 38 cm cm Ankle Left: Right: Point of Measurement: 11 cm From Medial Instep 23 cm cm Vascular Assessment Pulses: Dorsalis Pedis Palpable: [Left:Yes] Posterior Tibial Extremity colors, hair growth, and conditions: Extremity Color: [Left:Hyperpigmented] Temperature of Extremity: [Left:Warm] Capillary Refill: [Left:< 3 seconds] Toe Nail Assessment Left: Right: Thick: Yes Discolored: Yes Deformed: No Improper Length and Hygiene: No Electronic Signature(s) Signed: 07/12/2016 4:12:19 PM By: Alric Quan Entered By: Alric Quan on 07/12/2016 08:59:19 Motta, Melchor F.  ([id number]) -------------------------------------------------------------------------------- Multi Wound Chart Details Patient Name: Carl Speller F. Date of Service: 07/12/2016 8:45 AM Medical Record Number: [id number] Patient Account Number: [id number] Date of Birth/Sex: [birth year] (56 y.o. Male) Treating RN: Ahmed Prima Primary Care Chayton Murata: [name] Other Clinician: Referring Sudie Bandel: [name] Treating Tiauna Whisnant/Extender: Cathie Olden in Treatment: 5 Vital Signs Height(in): 70 Pulse(bpm): 59 Weight(lbs): 179 Blood Pressure 155/81 (mmHg): Body Mass Index(BMI): 26 Temperature(F): 97.5 Respiratory Rate 20 (  breaths/min): Photos: [1:No Photos] [N/A:N/A] Wound Location: [1:Left Lower Leg - Anterior] [N/A:N/A] Wounding Event: [1:Trauma] [N/A:N/A] Primary Etiology: [1:Diabetic Wound/Ulcer of the Lower Extremity] [N/A:N/A] Comorbid History: [1:Cataracts, Asthma, Sleep Apnea, Congestive Heart Failure, Coronary Artery Disease, Hypertension, Type II Diabetes, End Stage Renal Disease, Gout] [N/A:N/A] Date Acquired: [1:10/11/2015] [N/A:N/A] Weeks of Treatment: [1:5] [N/A:N/A] Wound Status: [1:Open] [N/A:N/A] Measurements L x W x D 0x0x0 [N/A:N/A] (cm) Area (cm) : [1:0] [N/A:N/A] Volume (cm) : [1:0] [N/A:N/A] % Reduction in Area: [1:100.00%] [N/A:N/A] % Reduction in Volume: 100.00% [N/A:N/A] Classification: [1:Grade 2] [N/A:N/A] Exudate Amount: [1:None Present] [N/A:N/A] Wound Margin: [1:Distinct, outline attached] [N/A:N/A] Granulation Amount: [1:None Present (0%)] [N/A:N/A] Necrotic Amount: [1:None Present (0%)] [N/A:N/A] Exposed Structures: [1:Fascia: No Fat Layer (Subcutaneous Tissue) Exposed: No Tendon: No] [N/A:N/A] Muscle: No Joint: No Bone: No Limited to Skin Breakdown Epithelialization: Large (67-100%) N/A N/A Periwound Skin Texture: No Abnormalities Noted N/A N/A Periwound Skin Dry/Scaly: No N/A N/A Moisture: Periwound Skin Color: No  Abnormalities Noted N/A N/A Temperature: No Abnormality N/A N/A Tenderness on No N/A N/A Palpation: Wound Preparation: Ulcer Cleansing: N/A N/A Rinsed/Irrigated with Saline Topical Anesthetic Applied: None Treatment Notes Electronic Signature(s) Signed: 07/12/2016 9:08:44 AM By: Lawanda Cousins Entered By: Lawanda Cousins on 07/12/2016 09:08:44 [name] ([id number]) -------------------------------------------------------------------------------- [place] Details Patient Name: [name]. Date of Service: 07/12/2016 8:45 AM Medical Record Number: [id number] Patient Account Number: [id number] Date of Birth/Sex: 04/03/1960 (56 y.o. Male) Treating RN: Ahmed Prima Primary Care Leiby Pigeon: [name] Other Clinician: Referring Tonnette Zwiebel: [name] Treating Coben Godshall/Extender: Cathie Olden in Treatment: 5 Active Inactive Electronic Signature(s) Signed: 07/12/2016 4:12:19 PM By: Alric Quan Entered By: Alric Quan on 07/12/2016 09:09:45 [name], [name] ([id number]) -------------------------------------------------------------------------------- Pain Assessment Details Patient Name: [name], [name]. Date of Service: 07/12/2016 8:45 AM Medical Record Number: [id number] Patient Account Number: [id number] Date of Birth/Sex: 13-Jun-1960 (56 y.o. Male) Treating RN: Ahmed Prima Primary Care Zacharias Ridling: [name] Other Clinician: Referring Kendalynn Wideman: [name] Treating Kaileigh Viswanathan/Extender: Cathie Olden in Treatment: 5 Active Problems Location of Pain Severity and Description of Pain Patient Has Paino No Site Locations With Dressing Change: No Pain Management and Medication Current Pain Management: Electronic Signature(s) Signed: 07/12/2016 4:12:19 PM By: Alric Quan Entered By: Alric Quan on 07/12/2016 08:48:37 [name], [name]  ([id number]) -------------------------------------------------------------------------------- Patient/Caregiver Education Details Patient Name: [name]. Date of Service: 07/12/2016 8:45 AM Medical Record Number: [id number] Patient Account Number: [id number] Date of Birth/Gender: 1961/01/01 (56 y.o. Male) Treating RN: Ahmed Prima Primary Care Physician: [name] Other Clinician: Referring Physician: [name] Treating Physician/Extender: Cathie Olden in Treatment: 5 Education Assessment Education Provided To: Patient Education Topics Provided Wound/Skin Impairment: Other: Wear you compression stockings everyday. Please call our office if you have any Handouts: questions or concern Methods: Explain/Verbal Responses: State content correctly Electronic Signature(s) Signed: 07/12/2016 4:12:19 PM By: Alric Quan Entered By: Alric Quan on 07/12/2016 09:09:15 [name], [name] ([id number]) -------------------------------------------------------------------------------- Wound Assessment Details Patient Name: Carl Speller F. Date of Service: 07/12/2016 8:45 AM Medical Record Number: [id number] Patient Account Number: [id number] Date of Birth/Sex: 1960/06/28 (56 y.o. Male) Treating RN: Ahmed Prima Primary Care Iniko Robles: [name] Other Clinician: Referring Adasha Boehme: [name] Treating Doris Gruhn/Extender: Cathie Olden in Treatment: 5 Wound Status Wound Number: 1 Primary Diabetic Wound/Ulcer of the Lower Etiology: Extremity Wound Location: Left Lower Leg - Anterior Wound Open Wounding Event: Trauma Status: Date Acquired: 10/11/2015 Comorbid Cataracts, Asthma, Sleep Apnea, Weeks Of Treatment: 5 History: Congestive Heart Failure, Coronary Clustered Wound:  No Artery Disease, Hypertension, Type II Diabetes, End Stage Renal Disease, Gout Photos Photo Uploaded By: Alric Quan on 07/12/2016 14:26:35 Wound  Measurements Length: (cm) 0 % Reductio Width: (cm) 0 % Reductio Depth: (cm) 0 Epithelial Area: (cm) 0 Tunneling Volume: (cm) 0 Undermini n in Area: 100% n in Volume: 100% ization: Large (67-100%) : No ng: No Wound Description Classification: Grade 2 Wound Margin: Distinct, outline attached Exudate Amount: None Present Foul Odor After Cleansing: No Slough/Fibrino No Wound Bed Granulation Amount: None Present (0%) Exposed Structure Necrotic Amount: None Present (0%) Fascia Exposed: No Fat Layer (Subcutaneous Tissue) Exposed: No Tendon Exposed: No Melamed, Carles F. ([id number]) Muscle Exposed: No Joint Exposed: No Bone Exposed: No Limited to Skin Breakdown Periwound Skin Texture Texture Color No Abnormalities Noted: No No Abnormalities Noted: No Moisture Temperature / Pain No Abnormalities Noted: No Temperature: No Abnormality Dry / Scaly: No Wound Preparation Ulcer Cleansing: Rinsed/Irrigated with Saline Topical Anesthetic Applied: None Electronic Signature(s) Signed: 07/12/2016 4:12:19 PM By: Alric Quan Entered By: Alric Quan on 07/12/2016 09:07:34 [name], [name] ([id number]) -------------------------------------------------------------------------------- Vitals Details Patient Name: [name]. Date of Service: 07/12/2016 8:45 AM Medical Record Number: [id number] Patient Account Number: [id number] Date of Birth/Sex: 10-10-1960 (56 y.o. Male) Treating RN: Ahmed Prima Primary Care Benuel Ly: [name] Other Clinician: Referring Jakhia Buxton: [name] Treating Leshawn Straka/Extender: Cathie Olden in Treatment: 5 Vital Signs Time Taken: 08:48 Temperature (F): 97.5 Height (in): 70 Pulse (bpm): 59 Weight (lbs): 179 Respiratory Rate (breaths/min): 20 Body Mass Index (BMI): 25.7 Blood Pressure (mmHg): 155/81 Reference Range: 80 - 120 mg / dl Electronic Signature(s) Signed: 07/12/2016 4:12:19 PM By: Alric Quan Entered By:  Alric Quan on 07/12/2016 08:50:20

## 2016-07-15 DIAGNOSIS — M79671 Pain in right foot: Secondary | ICD-10-CM | POA: Diagnosis not present

## 2016-07-15 DIAGNOSIS — M7672 Peroneal tendinitis, left leg: Secondary | ICD-10-CM | POA: Diagnosis not present

## 2016-07-15 DIAGNOSIS — M7671 Peroneal tendinitis, right leg: Secondary | ICD-10-CM | POA: Diagnosis not present

## 2016-07-15 DIAGNOSIS — M79672 Pain in left foot: Secondary | ICD-10-CM | POA: Diagnosis not present

## 2016-07-15 DIAGNOSIS — E119 Type 2 diabetes mellitus without complications: Secondary | ICD-10-CM | POA: Diagnosis not present

## 2016-07-18 ENCOUNTER — Ambulatory Visit: Payer: 59 | Admitting: Surgery

## 2016-07-25 DIAGNOSIS — G4733 Obstructive sleep apnea (adult) (pediatric): Secondary | ICD-10-CM | POA: Diagnosis not present

## 2016-07-25 DIAGNOSIS — E662 Morbid (severe) obesity with alveolar hypoventilation: Secondary | ICD-10-CM | POA: Diagnosis not present

## 2016-07-25 DIAGNOSIS — I482 Chronic atrial fibrillation: Secondary | ICD-10-CM | POA: Diagnosis not present

## 2016-08-02 DIAGNOSIS — D8989 Other specified disorders involving the immune mechanism, not elsewhere classified: Secondary | ICD-10-CM | POA: Diagnosis not present

## 2016-08-02 DIAGNOSIS — Z7901 Long term (current) use of anticoagulants: Secondary | ICD-10-CM | POA: Diagnosis not present

## 2016-08-02 DIAGNOSIS — Z09 Encounter for follow-up examination after completed treatment for conditions other than malignant neoplasm: Secondary | ICD-10-CM | POA: Diagnosis not present

## 2016-08-02 DIAGNOSIS — N186 End stage renal disease: Secondary | ICD-10-CM | POA: Diagnosis not present

## 2016-08-02 DIAGNOSIS — R808 Other proteinuria: Secondary | ICD-10-CM | POA: Diagnosis not present

## 2016-08-02 DIAGNOSIS — G4733 Obstructive sleep apnea (adult) (pediatric): Secondary | ICD-10-CM | POA: Diagnosis not present

## 2016-08-02 DIAGNOSIS — E1122 Type 2 diabetes mellitus with diabetic chronic kidney disease: Secondary | ICD-10-CM | POA: Diagnosis not present

## 2016-08-02 DIAGNOSIS — R300 Dysuria: Secondary | ICD-10-CM | POA: Diagnosis not present

## 2016-08-02 DIAGNOSIS — D649 Anemia, unspecified: Secondary | ICD-10-CM | POA: Diagnosis not present

## 2016-08-02 DIAGNOSIS — I10 Essential (primary) hypertension: Secondary | ICD-10-CM | POA: Diagnosis not present

## 2016-08-02 DIAGNOSIS — E876 Hypokalemia: Secondary | ICD-10-CM | POA: Diagnosis not present

## 2016-08-02 DIAGNOSIS — I4891 Unspecified atrial fibrillation: Secondary | ICD-10-CM | POA: Diagnosis not present

## 2016-08-02 DIAGNOSIS — E872 Acidosis: Secondary | ICD-10-CM | POA: Diagnosis not present

## 2016-08-02 DIAGNOSIS — E785 Hyperlipidemia, unspecified: Secondary | ICD-10-CM | POA: Diagnosis not present

## 2016-08-02 DIAGNOSIS — R600 Localized edema: Secondary | ICD-10-CM | POA: Diagnosis not present

## 2016-08-02 DIAGNOSIS — E119 Type 2 diabetes mellitus without complications: Secondary | ICD-10-CM | POA: Diagnosis not present

## 2016-08-02 DIAGNOSIS — Z794 Long term (current) use of insulin: Secondary | ICD-10-CM | POA: Diagnosis not present

## 2016-08-02 DIAGNOSIS — Z940 Kidney transplant status: Secondary | ICD-10-CM | POA: Diagnosis not present

## 2016-08-12 ENCOUNTER — Other Ambulatory Visit: Payer: Self-pay

## 2016-08-12 NOTE — Patient Outreach (Signed)
[facility]) Care Management  08/12/2016  [name] Aug 23, 1960 [id number]   Unable to reach patient by phone so I sent him an e-mail message. "I have not heard from you in a while. The last time I saw you, you said you would call me with your A1C results and we'd go from there.  I see in your chart, you have been going to the doctor.  I tried to reach you by phone today but your mailbox is full.  Please let me know if I can see you this Wednesday after 11am."    [name], RN, BA, [place], [place]:  [phone]  Fax:  [phone] E-mail: [name] [address] [place], [place]  [address]

## 2016-08-12 NOTE — Patient Outreach (Signed)
[facility]) Care Management  08/12/2016  [name] April 03, 1960 [id number]   Called [name] to get an update on his health because I have not heard from him.  Unable to leave a voice message because his mailbox is full.    [name], RN, BA, [place], [place] Direct Dial:  [phone]  Fax:  [phone] E-mail: [name] [address], [place], [place]  [address]
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
## 2016-08-22 DIAGNOSIS — M7672 Peroneal tendinitis, left leg: Secondary | ICD-10-CM | POA: Diagnosis not present

## 2016-08-22 DIAGNOSIS — M7671 Peroneal tendinitis, right leg: Secondary | ICD-10-CM | POA: Diagnosis not present

## 2016-08-25 DIAGNOSIS — E662 Morbid (severe) obesity with alveolar hypoventilation: Secondary | ICD-10-CM | POA: Diagnosis not present

## 2016-08-25 DIAGNOSIS — G4733 Obstructive sleep apnea (adult) (pediatric): Secondary | ICD-10-CM | POA: Diagnosis not present

## 2016-09-24 DIAGNOSIS — I482 Chronic atrial fibrillation: Secondary | ICD-10-CM | POA: Diagnosis not present

## 2016-09-24 DIAGNOSIS — E662 Morbid (severe) obesity with alveolar hypoventilation: Secondary | ICD-10-CM | POA: Diagnosis not present

## 2016-09-24 DIAGNOSIS — G4733 Obstructive sleep apnea (adult) (pediatric): Secondary | ICD-10-CM | POA: Diagnosis not present

## 2016-10-09 ENCOUNTER — Telehealth: Payer: Self-pay | Admitting: Cardiovascular Disease

## 2016-10-09 DIAGNOSIS — Z79899 Other long term (current) drug therapy: Secondary | ICD-10-CM | POA: Diagnosis not present

## 2016-10-09 DIAGNOSIS — Z885 Allergy status to narcotic agent status: Secondary | ICD-10-CM | POA: Diagnosis not present

## 2016-10-09 DIAGNOSIS — E785 Hyperlipidemia, unspecified: Secondary | ICD-10-CM | POA: Diagnosis not present

## 2016-10-09 DIAGNOSIS — I2510 Atherosclerotic heart disease of native coronary artery without angina pectoris: Secondary | ICD-10-CM | POA: Diagnosis not present

## 2016-10-09 DIAGNOSIS — E872 Acidosis: Secondary | ICD-10-CM | POA: Diagnosis not present

## 2016-10-09 DIAGNOSIS — Z9884 Bariatric surgery status: Secondary | ICD-10-CM | POA: Diagnosis not present

## 2016-10-09 DIAGNOSIS — B259 Cytomegaloviral disease, unspecified: Secondary | ICD-10-CM | POA: Diagnosis not present

## 2016-10-09 DIAGNOSIS — Z4822 Encounter for aftercare following kidney transplant: Secondary | ICD-10-CM | POA: Diagnosis not present

## 2016-10-09 DIAGNOSIS — R601 Generalized edema: Secondary | ICD-10-CM | POA: Diagnosis not present

## 2016-10-09 DIAGNOSIS — Z794 Long term (current) use of insulin: Secondary | ICD-10-CM | POA: Diagnosis not present

## 2016-10-09 DIAGNOSIS — I120 Hypertensive chronic kidney disease with stage 5 chronic kidney disease or end stage renal disease: Secondary | ICD-10-CM | POA: Diagnosis not present

## 2016-10-09 DIAGNOSIS — E876 Hypokalemia: Secondary | ICD-10-CM | POA: Diagnosis not present

## 2016-10-09 DIAGNOSIS — Z7901 Long term (current) use of anticoagulants: Secondary | ICD-10-CM | POA: Diagnosis not present

## 2016-10-09 DIAGNOSIS — Z951 Presence of aortocoronary bypass graft: Secondary | ICD-10-CM | POA: Diagnosis not present

## 2016-10-09 DIAGNOSIS — R809 Proteinuria, unspecified: Secondary | ICD-10-CM | POA: Diagnosis not present

## 2016-10-09 DIAGNOSIS — Z01818 Encounter for other preprocedural examination: Secondary | ICD-10-CM | POA: Diagnosis not present

## 2016-10-09 DIAGNOSIS — Z940 Kidney transplant status: Secondary | ICD-10-CM | POA: Diagnosis not present

## 2016-10-09 DIAGNOSIS — D899 Disorder involving the immune mechanism, unspecified: Secondary | ICD-10-CM | POA: Diagnosis not present

## 2016-10-09 DIAGNOSIS — N186 End stage renal disease: Secondary | ICD-10-CM | POA: Diagnosis not present

## 2016-10-09 DIAGNOSIS — E1122 Type 2 diabetes mellitus with diabetic chronic kidney disease: Secondary | ICD-10-CM | POA: Diagnosis not present

## 2016-10-09 DIAGNOSIS — E8881 Metabolic syndrome: Secondary | ICD-10-CM | POA: Diagnosis not present

## 2016-10-09 DIAGNOSIS — I4891 Unspecified atrial fibrillation: Secondary | ICD-10-CM | POA: Diagnosis not present

## 2016-10-09 DIAGNOSIS — D631 Anemia in chronic kidney disease: Secondary | ICD-10-CM | POA: Diagnosis not present

## 2016-10-09 DIAGNOSIS — G4733 Obstructive sleep apnea (adult) (pediatric): Secondary | ICD-10-CM | POA: Diagnosis not present

## 2016-10-09 DIAGNOSIS — E119 Type 2 diabetes mellitus without complications: Secondary | ICD-10-CM | POA: Diagnosis not present

## 2016-10-09 DIAGNOSIS — I10 Essential (primary) hypertension: Secondary | ICD-10-CM | POA: Diagnosis not present

## 2016-10-09 LAB — HEMOGLOBIN A1C: Hemoglobin A1C: 6.1

## 2016-10-09 NOTE — Telephone Encounter (Signed)
Pt calling stating he's been having some swelling going on for about 3-4week but can't seem to get it under control  Would like some advise on this  Please advise.

## 2016-10-09 NOTE — Telephone Encounter (Signed)
Pt reports bilateral lower edema and abdominal swelling for 3-4 weeks.  He has been wearing compression stockings which helps LE edema, takes lasix 89m qd and metolazone 2.555mWed and Saturday. Lasix was increased to 6024mt today's OV with his kidney transplant team. He takes an extra lasix PRN if SOB and denies these sx at this time. Fluid is restricted to 60 oz/day; measures intake and output carefully.  Kidney transplant team reports "kidneys looked ok" at today's OV. Suggested he f/u w/cardiology for further evaluation and possible echo as it was last done in 2016.   He weighs daily but is unsure of his dry weight as he has been eating healthier and thinks he has lost weight.  Requests 6 month OV appt. Advised pt to continue wearing compression stockings, monitor I/O, low sodium diet, and continue meds as prescribed. Will forward to scheduling for appt and MD for further recommendations.

## 2016-10-10 ENCOUNTER — Telehealth: Payer: Self-pay

## 2016-10-10 NOTE — Telephone Encounter (Signed)
Okay; thanks.

## 2016-10-10 NOTE — Telephone Encounter (Signed)
Pt scheduled to come in tomorrow 10/11/16 to see [name]

## 2016-10-10 NOTE — Telephone Encounter (Signed)
-----  Message from Georgiana Shore, RN sent at 10/09/2016  5:34 PM EDT ----- Can you call him for f/u OV w/Arida or APP? Due for 6 month f/u. Experiencing LE edema.

## 2016-10-10 NOTE — Telephone Encounter (Signed)
unbale to leave message, VM full

## 2016-10-11 ENCOUNTER — Encounter: Payer: Self-pay | Admitting: Cardiovascular Disease

## 2016-10-11 ENCOUNTER — Ambulatory Visit (INDEPENDENT_AMBULATORY_CARE_PROVIDER_SITE_OTHER): Payer: 59 | Admitting: Cardiovascular Disease

## 2016-10-11 VITALS — BP 154/64 | HR 65 | Ht 71.0 in | Wt 285.5 lb

## 2016-10-11 DIAGNOSIS — I5023 Acute on chronic systolic (congestive) heart failure: Secondary | ICD-10-CM | POA: Diagnosis not present

## 2016-10-11 DIAGNOSIS — I2510 Atherosclerotic heart disease of native coronary artery without angina pectoris: Secondary | ICD-10-CM

## 2016-10-11 DIAGNOSIS — E785 Hyperlipidemia, unspecified: Secondary | ICD-10-CM

## 2016-10-11 DIAGNOSIS — I482 Chronic atrial fibrillation, unspecified: Secondary | ICD-10-CM

## 2016-10-11 DIAGNOSIS — H43813 Vitreous degeneration, bilateral: Secondary | ICD-10-CM | POA: Diagnosis not present

## 2016-10-11 DIAGNOSIS — H35372 Puckering of macula, left eye: Secondary | ICD-10-CM | POA: Diagnosis not present

## 2016-10-11 LAB — HM DIABETES EYE EXAM

## 2016-10-11 MED ORDER — EPLERENONE 25 MG PO TABS: 25.0000 mg | ORAL_TABLET | Freq: Every day | ORAL | 3 refills | Status: DC

## 2016-10-11 NOTE — Patient Instructions (Addendum)
Medication Instructions:  Your physician has recommended you make the following change in your medication:  START taking eplerenone 65m once daily    [facility] necessary  Testing/Procedures: Your physician has requested that you have an echocardiogram. Echocardiography is a painless test that uses sound waves to create images of your heart. It provides your doctor with information about the size and shape of your heart and how well your heart's chambers and valves are working. This procedure takes approximately one hour. There are no restrictions for this procedure.     Follow-Up: Your physician recommends that you schedule a follow-up appointment with Dr. AFletcher Anonafter echo is complete.    Any Other Special Instructions Will Be Listed Below (If Applicable).     If you need a refill on your cardiac medications before your next appointment, please call your pharmacy.  Echocardiogram An echocardiogram, or echocardiography, uses sound waves (ultrasound) to produce an image of your heart. The echocardiogram is simple, painless, obtained within a short period of time, and offers valuable information to your health care provider. The images from an echocardiogram can provide information such as:  Evidence of coronary artery disease (CAD).  Heart size.  Heart muscle function.  Heart valve function.  Aneurysm detection.  Evidence of a past heart attack.  Fluid buildup around the heart.  Heart muscle thickening.  Assess heart valve function.  Tell a health care provider about:  Any allergies you have.  All medicines you are taking, including vitamins, herbs, eye drops, creams, and over-the-counter medicines.  Any problems you or family members have had with anesthetic medicines.  Any blood disorders you have.  Any surgeries you have had.  Any medical conditions you have.  Whether you are pregnant or may be  pregnant. What happens before the procedure? No special preparation is needed. Eat and drink normally. What happens during the procedure?  In order to produce an image of your heart, gel will be applied to your chest and a wand-like tool (transducer) will be moved over your chest. The gel will help transmit the sound waves from the transducer. The sound waves will harmlessly bounce off your heart to allow the heart images to be captured in real-time motion. These images will then be recorded.  You may need an IV to receive a medicine that improves the quality of the pictures. What happens after the procedure? You may return to your normal schedule including diet, activities, and medicines, unless your health care provider tells you otherwise. This information is not intended to replace advice given to you by your health care provider. Make sure you discuss any questions you have with your health care provider. Document Released: 03/08/2000 Document Revised: 10/28/2015 Document Reviewed: 11/16/2012 Elsevier Interactive Patient Education  2017 [employer]

## 2016-10-11 NOTE — Progress Notes (Signed)
Cardiology Office Note   Date:  10/11/2016   ID:  [name], [name] 08/15/1960, MRN [id number]  PCP:  [name], MD  Cardiologist:   [name], MD   Chief Complaint  Patient presents with  . other    6 month f/u c/o fluid retention and weight gain. Meds reviewed verbally with pt.      History of Present Illness: [name] is a 56 y.o. male who presents for  a follow up visit. He has known history of coronary artery disease status post CABG in 2004 with no recurrent ischemic events since then, chronic systolic heart failure with ejection fraction of 35-40 %, type 2 diabetes, end-stage renal disease previously on hemodialysis from 2004 - 2012 when he got a kidney transplant, hypertension, hyperlipidemia, chronic atrial fibrillation and sleep apnea.   Most recent echocardiogram in July 2016 showed an EF of 35-40%, severely dilated left atrium and mild pulmonary hypertension. She underwent gastric bypass in September 1761 without complications.  He did have significant bradycardia while he was on small dose carvedilol which was discontinued.  Over the last few months, he has experienced worsening shortness of breath, abdominal swelling and leg edema with weight gain. This is in spite of being more careful with his diet. No chest pain. He saw his nephrologist recently and his renal function was stable with creatinine of 1.03 and potassium of 3.3. He was taking furosemide 40 mg once daily. The dose was increased to 60 mg once daily which he started today. Metolazone was increased from twice weekly to 3 times weekly. He doesn't feel he is having good urine output even with 60 mg of furosemide.    Past Medical History:  Diagnosis Date  . Allergy    seasonal  . Amnestic MCI (mild cognitive impairment with memory loss)   . Asthma   . Atrial fibrillation ([place])   . Atrial fibrillation ([place]) 2016  . Benign neoplasm of colon   . CAD (coronary artery disease) 2005   had  CABG  . CHF (congestive heart failure) ([place])   . Chronic venous stasis dermatitis of both lower extremities   . Cytomegaloviral disease ([place]) 2017  . Depression   . Diabetes mellitus   . Diverticulosis   . Dyspnea   . Erectile dysfunction   . ESRD (end stage renal disease) ([place]) 2005   Dialysis then renal transplant  . GERD (gastroesophageal reflux disease)   . Gout   . Hyperlipidemia    low since renal failure  . Hypertension   . Kidney transplant status, cadaveric 2012  . Obesity   . Pneumonia   . Purpura ([place])   . Sleep apnea    bipap with oxygen 2 l  . Trigger finger   . Ulcer 05/2016   Left shin  . Wears glasses     Past Surgical History:  Procedure Laterality Date  . BARIATRIC SURGERY    . CARDIAC CATHETERIZATION     [facility]  . COLONOSCOPY WITH PROPOFOL N/A 04/22/2013   Procedure: COLONOSCOPY WITH PROPOFOL;  Surgeon: [name], MD;  [facility];  Service: Endoscopy;  Laterality: N/A;  . CORONARY ARTERY BYPASS GRAFT  2005   X 4  . DG ANGIO AV SHUNT*L*     right and left upper arms  . FASCIOTOMY  03/03/2012   Procedure: FASCIOTOMY;  Surgeon: [name], MD;  Location: [facility];  Service: Orthopedics;  Laterality: Right;  FASCIOTOMY  RIGHT SMALL FINGER  . FASCIOTOMY Left 08/17/2013   Procedure: FASCIOTOMY LEFT RING;  Surgeon: [name], MD;  Location: [facility];  Service: Orthopedics;  Laterality: Left;  . INCISION AND DRAINAGE ABSCESS Left 10/15/2015   Procedure: INCISION AND DRAINAGE ABSCESS;  Surgeon: [name], MD;  Location: [facility];  Service: General;  Laterality: Left;  . KIDNEY TRANSPLANT  09/13/2010   cadaver--at [facility]  1/12   left  . VASECTOMY       Current Outpatient Prescriptions  Medication Sig Dispense Refill  . amLODipine (NORVASC) 5 MG tablet TAKE 1 TABLET BY MOUTH DAILY 90 tablet 3  . beclomethasone (QVAR) 80 MCG/ACT inhaler Inhale 2 puffs into the lungs 2  (two) times daily. 1 Inhaler 6  . buPROPion (WELLBUTRIN) 75 MG tablet Take 1 tablet (75 mg total) by mouth 2 (two) times daily. 180 tablet 3  . CIALIS 20 MG tablet TAKE 1/2 TO 1 TABLET BY MOUTH EVERY OTHER DAY AS NEEDED 5 tablet 11  . ELIQUIS 5 MG TABS tablet TAKE 1 TABLET BY MOUTH 2 TIMES DAILY 60 tablet 3  . furosemide (LASIX) 40 MG tablet Take 40 mg by mouth 2 (two) times daily. Per Renal Doctor    . insulin aspart (NOVOLOG FLEXPEN) 100 UNIT/ML FlexPen Inject 5 Units into the skin 3 (three) times daily with meals as needed for high blood sugar. Pt uses as needed per sliding scale. Patient is taking 0-5 units tid    . insulin glargine (LANTUS) 100 unit/mL SOPN Inject 20 Units into the skin at bedtime.     Marland Kitchen lisinopril (PRINIVIL,ZESTRIL) 40 MG tablet TAKE 1 TABLET BY MOUTH DAILY 30 tablet 3  . metolazone (ZAROXOLYN) 2.5 MG tablet Take 2.5 mg by mouth every Monday, Wednesday, and Friday.     . mometasone (NASONEX) 50 MCG/ACT nasal spray PLACE 2 SPRAYS INTO THE NOSE AT BEDTIME. 17 g 0  . Multiple Vitamin (MULTIVITAMIN WITH MINERALS) TABS tablet Take 1 tablet by mouth daily.    . mycophenolate (MYFORTIC) 180 MG EC tablet Take 540 mg by mouth 2 (two) times daily.     Marland Kitchen omeprazole (PRILOSEC) 20 MG capsule TAKE 1 CAPSULE BY MOUTH ONCE DAILY 90 capsule 3  . potassium chloride (K-DUR,KLOR-CON) 10 MEQ tablet Take 40 mEq by mouth daily.     . rosuvastatin (CRESTOR) 10 MG tablet Take 1 tablet (10 mg total) by mouth daily. (Patient taking differently: Take 10 mg by mouth daily. taking) 90 tablet 3  . tacrolimus (PROGRAF) 1 MG capsule Take 1 mg by mouth 2 (two) times daily.     No current facility-administered medications for this visit.     Allergies:   Morphine    Social History:  The patient  reports that he quit smoking about 22 years ago. His smoking use included Cigars. He has never used smokeless tobacco. He reports that he drinks alcohol. He reports that he does not use drugs.   Family History:   The patient's family history includes Breast cancer in his maternal grandmother; Diabetes in his maternal grandmother; Heart disease in his father; Kidney disease in his father; Kidney failure in his father; Liver cancer in his paternal grandmother and paternal uncle.    ROS:  Please see the history of present illness.   Otherwise, review of systems are positive for none.   All other systems are reviewed and negative.    PHYSICAL EXAM: VS:  BP (!) 154/64 (  BP Location: Right Arm, Patient Position: Sitting, Cuff Size: Normal)   Pulse 65   Ht _0  (1.803 m)   Wt 285 lb 8 oz (129.5 kg)   BMI 39.82 kg/m  , BMI Body mass index is 39.82 kg/m. GEN: Well nourished, well developed, in no acute distress  HEENT: normal  Neck:Significant  JVD, carotid bruits, or masses Cardiac: Irregularly irregular; no  rubs, or gallops, moderate leg  edema . 2/6 systolic ejection murmur in the aortic area. Respiratory:  clear to auscultation bilaterally, normal work of breathing GI: soft, nontender, , + BS. Mildly distended abdomen with possible ascites.  MS: no deformity or atrophy  Skin: warm and dry, no rash Neuro:  Strength and sensation are intact Psych: euthymic mood, full affect   EKG:  EKG is ordered today. The ekg ordered today demonstrates atrial fibrillation with nonspecific IVCD.  Ventricular rate is 65 bpm.   Recent Labs: 10/18/2015: Hemoglobin 11.7; Platelets 165 03/06/2016: ALT 14; BUN 16; Creatinine, Ser 0.97; Potassium 3.5; Sodium 138    Lipid Panel    Component Value Date/Time   CHOL 92 03/06/2016 0736   CHOL 161 03/07/2015 0823   TRIG 73 03/06/2016 0736   HDL 34 (L) 03/06/2016 0736   HDL 38 (L) 03/07/2015 0823   CHOLHDL 2.7 03/06/2016 0736   VLDL 15 03/06/2016 0736   LDLCALC 43 03/06/2016 0736   LDLCALC 105 (H) 03/07/2015 0823   LDLDIRECT 41.9 04/20/2012 1618      Wt Readings from Last 3 Encounters:  10/11/16 285 lb 8 oz (129.5 kg)  06/18/16 283 lb 3.2 oz (128.5 kg)    06/05/16 283 lb (128.4 kg)       No flowsheet data found.    ASSESSMENT AND PLAN:  1. Acute on chronic systolic heart failure:  The patient appears to be significantly volume overloaded. His weight today is 285 pounds. His dry weight is probably close to 275 pounds. He is showing mostly signs of right heart failure. I requested an echocardiogram for evaluation. Given his elevated blood pressure and hypokalemia, I elected to add eplerenone which hopefully will assist with diuresis. We have to be careful with aggressive diuresis given previous kidney transplant.  Check basic metabolic profile in one week. If he does not improve with current regimen, we should strongly consider switching from furosemide to torsemide.  Continue treatment with lisinopril. He is not a beta blocker due to bradycardia.   2. Chronic atrial fibrillation: Rate is controlled without medications. He is tolerating anticoagulation with Eliquis.  3. Coronary artery disease involving native coronary arteries without angina: Continue medical therapy.   4. Hyperlipidemia: Continue treatment with rosuvastatin 10 mg daily. Most recent LDL was 43.   Disposition:   FU with me in one month.  Signed,  [name], MD  10/11/2016 3:57 PM    [place]

## 2016-10-14 ENCOUNTER — Ambulatory Visit (INDEPENDENT_AMBULATORY_CARE_PROVIDER_SITE_OTHER): Payer: 59 | Admitting: Family Medicine

## 2016-10-14 ENCOUNTER — Encounter: Payer: Self-pay | Admitting: Family Medicine

## 2016-10-14 VITALS — BP 136/78 | HR 72 | Temp 97.8°F | Ht 71.0 in | Wt 287.8 lb

## 2016-10-14 DIAGNOSIS — I2510 Atherosclerotic heart disease of native coronary artery without angina pectoris: Secondary | ICD-10-CM

## 2016-10-14 DIAGNOSIS — R229 Localized swelling, mass and lump, unspecified: Secondary | ICD-10-CM | POA: Diagnosis not present

## 2016-10-14 DIAGNOSIS — IMO0002 Reserved for concepts with insufficient information to code with codable children: Secondary | ICD-10-CM | POA: Insufficient documentation

## 2016-10-14 MED ORDER — CEPHALEXIN 500 MG PO CAPS: 500.0000 mg | ORAL_CAPSULE | Freq: Three times a day (TID) | ORAL | 0 refills | Status: DC

## 2016-10-14 NOTE — Progress Notes (Signed)
Subjective:    Patient ID: [name], male    DOB: 10/15/1960, 56 y.o.   MRN: [id number]  HPI Here for a lump on R buttock cheek  (low at jxn with thigh) Noticed in the past week on vacation  Started small- getting bigger and tender (feels like he is sitting on golf ball)  No drainage   Has not put anything on it   Hx of a fib and CHF with diabetes and renal failure s/p renal transplant  Is immunocompromised  Kidney works well   On eliquis  No trauma or excessive bruising or bleeding    Patient Active Problem List   Diagnosis Date Noted  . Lump 10/14/2016  . Chronic venous insufficiency 06/05/2016  . Acute upper respiratory infection 03/06/2016  . Chronic ulcer of left leg ([place]) 10/26/2015  . Cytomegalovirus (CMV) viremia ([place]) 09/07/2015  . Acute renal failure (ARF) ([place]) 08/30/2015  . Proteinuria 06/23/2015  . BPH with obstruction/lower urinary tract symptoms 09/25/2014  . Hypogonadism in male 09/25/2014  . Immunosuppressed status ([place]) 09/09/2014  . Atrial fibrillation ([place]) 07/18/2014  . Bariatric surgery status 04/25/2014  . Chronic systolic heart failure ([place]) 12/13/2013  . DM eye manif type II 10/27/2013  . MDD (recurrent major depressive disorder) in remission ([place]) 10/04/2013  . Benign neoplasm of colon 04/22/2013  . Diverticulosis of colon (without mention of hemorrhage) 04/22/2013  . History of renal transplant 03/01/2013  . Asthma, persistent controlled 02/25/2013  . Erectile dysfunction 10/20/2012  . Obstructive sleep apnea 09/29/2012  . Gout 09/13/2012  . Obesity hypoventilation syndrome ([place]) 03/31/2012  . Routine general medical examination at a health care facility 11/12/2011  . Type 2 diabetes, controlled, with renal manifestation ([place]) 03/01/2011  . Essential (primary) hypertension 12/17/2010  . H/O kidney transplant 12/17/2010  . CAD, multiple vessel 12/17/2010  . CAD (coronary artery disease)   . Hypertension   . Hyperlipidemia   . ESRD  (end stage renal disease) ([place])    Past Medical History:  Diagnosis Date  . Allergy    seasonal  . Amnestic MCI (mild cognitive impairment with memory loss)   . Asthma   . Atrial fibrillation ([place])   . Atrial fibrillation ([place]) 2016  . Benign neoplasm of colon   . CAD (coronary artery disease) 2005   had CABG  . CHF (congestive heart failure) ([place])   . Chronic venous stasis dermatitis of both lower extremities   . Cytomegaloviral disease ([place]) 2017  . Depression   . Diabetes mellitus   . Diverticulosis   . Dyspnea   . Erectile dysfunction   . ESRD (end stage renal disease) ([place]) 2005   Dialysis then renal transplant  . GERD (gastroesophageal reflux disease)   . Gout   . Hyperlipidemia    low since renal failure  . Hypertension   . Kidney transplant status, cadaveric 2012  . Obesity   . Pneumonia   . Purpura ([place])   . Sleep apnea    bipap with oxygen 2 l  . Trigger finger   . Ulcer 05/2016   Left shin  . Wears glasses    Past Surgical History:  Procedure Laterality Date  . BARIATRIC SURGERY    . CARDIAC CATHETERIZATION     [facility]  . COLONOSCOPY WITH PROPOFOL N/A 04/22/2013   Procedure: COLONOSCOPY WITH PROPOFOL;  Surgeon: [name], MD;  [facility];  Service: Endoscopy;  Laterality: N/A;  . CORONARY ARTERY BYPASS GRAFT  2005  X 4  . DG ANGIO AV SHUNT*L*     right and left upper arms  . FASCIOTOMY  03/03/2012   Procedure: FASCIOTOMY;  Surgeon: [name], MD;  Location: [facility];  Service: Orthopedics;  Laterality: Right;  FASCIOTOMY RIGHT SMALL FINGER  . FASCIOTOMY Left 08/17/2013   Procedure: FASCIOTOMY LEFT RING;  Surgeon: [name], MD;  Location: [facility];  Service: Orthopedics;  Laterality: Left;  . INCISION AND DRAINAGE ABSCESS Left 10/15/2015   Procedure: INCISION AND DRAINAGE ABSCESS;  Surgeon: [name], MD;  Location: [facility];  Service: General;  Laterality: Left;  . KIDNEY TRANSPLANT  09/13/2010    cadaver--at [facility]  1/12   left  . VASECTOMY     Social History  Substance Use Topics  . Smoking status: Former Smoker    Types: Cigars    Quit date: 09/02/1994  . Smokeless tobacco: Never Used  . Alcohol use 0.0 - 0.6 oz/week     Comment: occasional- 3 in the past year   Family History  Problem Relation Age of Onset  . Heart disease Father   . Kidney failure Father   . Kidney disease Father   . Diabetes Maternal Grandmother   . Breast cancer Maternal Grandmother   . Liver cancer Paternal Uncle   . Liver cancer Paternal Grandmother   . Prostate cancer Neg Hx    Allergies  Allergen Reactions  . Morphine Itching   Current Outpatient Prescriptions on File Prior to Visit  Medication Sig Dispense Refill  . amLODipine (NORVASC) 5 MG tablet TAKE 1 TABLET BY MOUTH DAILY 90 tablet 3  . beclomethasone (QVAR) 80 MCG/ACT inhaler Inhale 2 puffs into the lungs 2 (two) times daily. 1 Inhaler 6  . buPROPion (WELLBUTRIN) 75 MG tablet Take 1 tablet (75 mg total) by mouth 2 (two) times daily. 180 tablet 3  . CIALIS 20 MG tablet TAKE 1/2 TO 1 TABLET BY MOUTH EVERY OTHER DAY AS NEEDED 5 tablet 11  . ELIQUIS 5 MG TABS tablet TAKE 1 TABLET BY MOUTH 2 TIMES DAILY 60 tablet 3  . eplerenone (INSPRA) 25 MG tablet Take 1 tablet (25 mg total) by mouth daily. 90 tablet 3  . furosemide (LASIX) 40 MG tablet Take 60 mg by mouth daily. Per Renal Doctor    . insulin aspart (NOVOLOG FLEXPEN) 100 UNIT/ML FlexPen Inject 5 Units into the skin 3 (three) times daily with meals as needed for high blood sugar. Pt uses as needed per sliding scale. Patient is taking 0-5 units tid    . insulin glargine (LANTUS) 100 unit/mL SOPN Inject 20 Units into the skin at bedtime.     Marland Kitchen lisinopril (PRINIVIL,ZESTRIL) 40 MG tablet TAKE 1 TABLET BY MOUTH DAILY 30 tablet 3  . metolazone (ZAROXOLYN) 2.5 MG tablet Take 2.5 mg by mouth every Monday, Wednesday, and Friday.     . mometasone (NASONEX) 50  MCG/ACT nasal spray PLACE 2 SPRAYS INTO THE NOSE AT BEDTIME. 17 g 0  . Multiple Vitamin (MULTIVITAMIN WITH MINERALS) TABS tablet Take 1 tablet by mouth daily.    . mycophenolate (MYFORTIC) 180 MG EC tablet Take 540 mg by mouth 2 (two) times daily.     Marland Kitchen omeprazole (PRILOSEC) 20 MG capsule TAKE 1 CAPSULE BY MOUTH ONCE DAILY 90 capsule 3  . potassium chloride (K-DUR,KLOR-CON) 10 MEQ tablet Take 20 mEq by mouth 2 (two) times daily.     . tacrolimus (  PROGRAF) 1 MG capsule Take 1 mg by mouth 2 (two) times daily.    . rosuvastatin (CRESTOR) 10 MG tablet Take 1 tablet (10 mg total) by mouth daily. (Patient taking differently: Take 10 mg by mouth daily. taking) 90 tablet 3   No current facility-administered medications on file prior to visit.     Review of Systems    Review of Systems  Constitutional: Negative for fever, appetite change, fatigue and unexpected weight change.  Eyes: Negative for pain and visual disturbance.  Respiratory: Negative for cough and shortness of breath.   Cardiovascular: Negative for cp or palpitations    Gastrointestinal: Negative for nausea, diarrhea and constipation.  Genitourinary: Negative for urgency and frequency.  Skin: Negative for pallor or rash  pos for lump in R buttock that is tender  Neurological: Negative for weakness, light-headedness, numbness and headaches.  Hematological: Negative for adenopathy. Does not bruise/bleed easily.  Psychiatric/Behavioral: Negative for dysphoric mood. The patient is not nervous/anxious.      Objective:   Physical Exam  Constitutional:  obese and well appearing   HENT:  Head: Normocephalic and atraumatic.  Eyes: Pupils are equal, round, and reactive to light. Conjunctivae and EOM are normal. No scleral icterus.  Neck: Normal range of motion. Neck supple.  Cardiovascular: Normal rate and regular rhythm.   Pulmonary/Chest: Effort normal and breath sounds normal.  Musculoskeletal: He exhibits no edema.  Lymphadenopathy:     He has no cervical adenopathy.  Neurological: He is alert.  Skin: Skin is warm and dry. No rash noted. No erythema. No pallor.  Deep firm mobile lump felt at lower buttock (R) at jxn with thigh  (approx 1.5 cm diameter and round) slt tender No redness/scab/drainage or warmth     Psychiatric: He has a normal mood and affect.          Assessment & Plan:   Problem List Items Addressed This Visit      Other   Lump    Suspect a deep cyst or abscess -but no erythema or drainage, but mildly tender Pt is immunocompromised  Lower R buttock  Nl rom hip  Recommend warm compress and tx with keflex  Update if not starting to improve in a week or if worsening   Consider imaging and labs

## 2016-10-14 NOTE — Assessment & Plan Note (Signed)
Suspect a deep cyst or abscess -but no erythema or drainage, but mildly tender Pt is immunocompromised  Lower R buttock  Nl rom hip  Recommend warm compress and tx with keflex  Update if not starting to improve in a week or if worsening   Consider imaging and labs

## 2016-10-14 NOTE — Patient Instructions (Signed)
I think you may have an abscess/boil developing that is deep  Use warm compresses as often as can  Take the keflex as directed  If worse or no improvement in 2-3 days  Follow up if needed

## 2016-10-16 ENCOUNTER — Ambulatory Visit (INDEPENDENT_AMBULATORY_CARE_PROVIDER_SITE_OTHER): Payer: 59

## 2016-10-16 ENCOUNTER — Other Ambulatory Visit: Payer: Self-pay

## 2016-10-16 DIAGNOSIS — I5023 Acute on chronic systolic (congestive) heart failure: Secondary | ICD-10-CM

## 2016-10-18 ENCOUNTER — Other Ambulatory Visit
Admission: RE | Admit: 2016-10-18 | Discharge: 2016-10-18 | Disposition: A | Discharge status: Home / Self Care | Payer: 59 | Source: Ambulatory Visit | Attending: Cardiovascular Disease | Admitting: Cardiovascular Disease

## 2016-10-18 ENCOUNTER — Telehealth: Payer: Self-pay | Admitting: Cardiovascular Disease

## 2016-10-18 ENCOUNTER — Other Ambulatory Visit: Payer: Self-pay

## 2016-10-18 DIAGNOSIS — I5022 Chronic systolic (congestive) heart failure: Secondary | ICD-10-CM

## 2016-10-18 DIAGNOSIS — I5023 Acute on chronic systolic (congestive) heart failure: Secondary | ICD-10-CM | POA: Diagnosis not present

## 2016-10-18 LAB — BASIC METABOLIC PANEL
ANION GAP: 7 (ref 5–15)
BUN: 27 mg/dL — ABNORMAL HIGH (ref 6–20)
CHLORIDE: 103 mmol/L (ref 101–111)
CO2: 28 mmol/L (ref 22–32)
CREATININE: 1.33 mg/dL — AB (ref 0.61–1.24)
Calcium: 9 mg/dL (ref 8.9–10.3)
GFR calc non Af Amer: 58 mL/min — ABNORMAL LOW (ref >60)
Glucose, Bld: 98 mg/dL (ref 65–99)
POTASSIUM: 3.7 mmol/L (ref 3.5–5.1)
SODIUM: 138 mmol/L (ref 135–145)

## 2016-10-18 MED ORDER — TORSEMIDE 20 MG PO TABS: 20.0000 mg | ORAL_TABLET | Freq: Two times a day (BID) | ORAL | 3 refills | Status: DC

## 2016-10-18 NOTE — Telephone Encounter (Signed)
Pt wife calling back Per wife please call [facility]

## 2016-10-18 NOTE — Telephone Encounter (Signed)
Was unable to reach pt with lab results and recommendations.   Called wife's cell number (ok per DPR). No answer, VM full.  Pt called back d/t missed call.  Reviewed results and recommendations w/pt who verbalized understanding.

## 2016-10-21 ENCOUNTER — Telehealth: Payer: Self-pay | Admitting: Cardiovascular Disease

## 2016-10-21 NOTE — Telephone Encounter (Signed)
Pt calling asking for a coupon to help pay for his Eliquis  He was told by pharmacy it would be $76 a month Please advise.

## 2016-10-22 NOTE — Telephone Encounter (Signed)
S/w patient. He said he just needed Eliquis Coupon. He does have Pharmacist, community. Coupon and 30-day free card left at front desk for patient to pick up at his earliest convenience. He was appreciative.

## 2016-10-25 ENCOUNTER — Other Ambulatory Visit
Admission: RE | Admit: 2016-10-25 | Discharge: 2016-10-25 | Disposition: A | Discharge status: Home / Self Care | Payer: 59 | Source: Ambulatory Visit | Attending: Cardiovascular Disease | Admitting: Cardiovascular Disease

## 2016-10-25 DIAGNOSIS — I482 Chronic atrial fibrillation: Secondary | ICD-10-CM | POA: Diagnosis not present

## 2016-10-25 DIAGNOSIS — E662 Morbid (severe) obesity with alveolar hypoventilation: Secondary | ICD-10-CM | POA: Diagnosis not present

## 2016-10-25 DIAGNOSIS — I5022 Chronic systolic (congestive) heart failure: Secondary | ICD-10-CM | POA: Diagnosis not present

## 2016-10-25 DIAGNOSIS — G4733 Obstructive sleep apnea (adult) (pediatric): Secondary | ICD-10-CM | POA: Diagnosis not present

## 2016-10-25 LAB — BASIC METABOLIC PANEL
Anion gap: 8 (ref 5–15)
BUN: 22 mg/dL — AB (ref 6–20)
CO2: 27 mmol/L (ref 22–32)
CREATININE: 1.4 mg/dL — AB (ref 0.61–1.24)
Calcium: 9 mg/dL (ref 8.9–10.3)
Chloride: 105 mmol/L (ref 101–111)
GFR calc Af Amer: >60 mL/min (ref >60)
GFR, EST NON AFRICAN AMERICAN: 54 mL/min — AB (ref >60)
Glucose, Bld: 97 mg/dL (ref 65–99)
POTASSIUM: 4 mmol/L (ref 3.5–5.1)
Sodium: 140 mmol/L (ref 135–145)

## 2016-10-28 ENCOUNTER — Other Ambulatory Visit: Payer: Self-pay

## 2016-10-28 ENCOUNTER — Ambulatory Visit: Payer: Self-pay | Admitting: Internal Medicine

## 2016-10-28 NOTE — Patient Outreach (Signed)
[facility]) Care Management  10/28/2016  [name] Dec 07, 1960 [id number]  Patient called to cancel this am's visit- he is feeling SOB.  He tells me his fluid pills were changed "about 2 weeks ago" and "they're just not working".  He complains of having swelling in his legs.  He has called [name] and the transplant team to discuss current state- he is awaiting a return call.   I have asked him to follow up with MD (by phone) if he has not heard back by 3pm or to go to the emergency room if he develops increased SOB or chest pain.  I have asked him to give me follow up call later this week to give me an update.   [name], RN, BA, [place], [place] Direct Dial:  [phone]  Fax:  [phone] E-mail: [name] [address] [place], [place]  [id number]

## 2016-10-29 ENCOUNTER — Encounter: Payer: Self-pay | Admitting: Internal Medicine

## 2016-10-29 DIAGNOSIS — E1122 Type 2 diabetes mellitus with diabetic chronic kidney disease: Secondary | ICD-10-CM | POA: Diagnosis not present

## 2016-10-29 DIAGNOSIS — N186 End stage renal disease: Secondary | ICD-10-CM | POA: Diagnosis not present

## 2016-10-29 DIAGNOSIS — Z4822 Encounter for aftercare following kidney transplant: Secondary | ICD-10-CM | POA: Diagnosis not present

## 2016-10-29 DIAGNOSIS — E119 Type 2 diabetes mellitus without complications: Secondary | ICD-10-CM | POA: Diagnosis not present

## 2016-10-29 DIAGNOSIS — I4891 Unspecified atrial fibrillation: Secondary | ICD-10-CM | POA: Diagnosis not present

## 2016-10-29 DIAGNOSIS — D631 Anemia in chronic kidney disease: Secondary | ICD-10-CM | POA: Diagnosis not present

## 2016-10-29 DIAGNOSIS — E876 Hypokalemia: Secondary | ICD-10-CM | POA: Diagnosis not present

## 2016-10-29 DIAGNOSIS — D8989 Other specified disorders involving the immune mechanism, not elsewhere classified: Secondary | ICD-10-CM | POA: Diagnosis not present

## 2016-10-29 DIAGNOSIS — Z885 Allergy status to narcotic agent status: Secondary | ICD-10-CM | POA: Diagnosis not present

## 2016-10-29 DIAGNOSIS — R808 Other proteinuria: Secondary | ICD-10-CM | POA: Diagnosis not present

## 2016-10-29 DIAGNOSIS — G4733 Obstructive sleep apnea (adult) (pediatric): Secondary | ICD-10-CM | POA: Diagnosis not present

## 2016-10-29 DIAGNOSIS — Z79899 Other long term (current) drug therapy: Secondary | ICD-10-CM | POA: Diagnosis not present

## 2016-10-29 DIAGNOSIS — Z7901 Long term (current) use of anticoagulants: Secondary | ICD-10-CM | POA: Diagnosis not present

## 2016-10-29 DIAGNOSIS — E785 Hyperlipidemia, unspecified: Secondary | ICD-10-CM | POA: Diagnosis not present

## 2016-10-29 DIAGNOSIS — Z9989 Dependence on other enabling machines and devices: Secondary | ICD-10-CM | POA: Diagnosis not present

## 2016-10-29 DIAGNOSIS — Z6841 Body Mass Index (BMI) 40.0 and over, adult: Secondary | ICD-10-CM | POA: Diagnosis not present

## 2016-10-29 DIAGNOSIS — Z940 Kidney transplant status: Secondary | ICD-10-CM | POA: Diagnosis not present

## 2016-10-29 DIAGNOSIS — B259 Cytomegaloviral disease, unspecified: Secondary | ICD-10-CM | POA: Diagnosis not present

## 2016-10-29 DIAGNOSIS — E872 Acidosis: Secondary | ICD-10-CM | POA: Diagnosis not present

## 2016-10-29 DIAGNOSIS — Z794 Long term (current) use of insulin: Secondary | ICD-10-CM | POA: Diagnosis not present

## 2016-10-29 DIAGNOSIS — I120 Hypertensive chronic kidney disease with stage 5 chronic kidney disease or end stage renal disease: Secondary | ICD-10-CM | POA: Diagnosis not present

## 2016-10-29 DIAGNOSIS — I10 Essential (primary) hypertension: Secondary | ICD-10-CM | POA: Diagnosis not present

## 2016-10-29 DIAGNOSIS — D899 Disorder involving the immune mechanism, unspecified: Secondary | ICD-10-CM | POA: Diagnosis not present

## 2016-10-29 DIAGNOSIS — I2510 Atherosclerotic heart disease of native coronary artery without angina pectoris: Secondary | ICD-10-CM | POA: Diagnosis not present

## 2016-10-29 DIAGNOSIS — E669 Obesity, unspecified: Secondary | ICD-10-CM | POA: Diagnosis not present

## 2016-10-29 DIAGNOSIS — Z951 Presence of aortocoronary bypass graft: Secondary | ICD-10-CM | POA: Diagnosis not present

## 2016-11-04 ENCOUNTER — Other Ambulatory Visit: Payer: Self-pay

## 2016-11-04 ENCOUNTER — Other Ambulatory Visit: Payer: Self-pay | Admitting: Cardiovascular Disease

## 2016-11-04 VITALS — BP 132/66 | HR 69 | Ht 71.0 in | Wt 290.1 lb

## 2016-11-04 DIAGNOSIS — Z794 Long term (current) use of insulin: Secondary | ICD-10-CM

## 2016-11-04 DIAGNOSIS — E118 Type 2 diabetes mellitus with unspecified complications: Secondary | ICD-10-CM

## 2016-11-04 NOTE — Patient Outreach (Signed)
[name]) Care Management  [place]  11/04/2016   [name] 1961-02-15 [id number]  Subjective:   Objective:   Encounter Medications:  Outpatient Encounter Prescriptions as of 11/04/2016  Medication Sig Note  . amLODipine (NORVASC) 5 MG tablet TAKE 1 TABLET BY MOUTH DAILY   . beclomethasone (QVAR) 80 MCG/ACT inhaler Inhale 2 puffs into the lungs 2 (two) times daily. 10/11/2016: PRN  . buPROPion (WELLBUTRIN) 75 MG tablet Take 1 tablet (75 mg total) by mouth 2 (two) times daily.   Marland Kitchen ELIQUIS 5 MG TABS tablet TAKE 1 TABLET BY MOUTH 2 TIMES DAILY   . insulin aspart (NOVOLOG FLEXPEN) 100 UNIT/ML FlexPen Inject 5 Units into the skin 3 (three) times daily with meals as needed for high blood sugar. Pt uses as needed per sliding scale. Patient is taking 0-5 units tid   . insulin glargine (LANTUS) 100 unit/mL SOPN Inject 20 Units into the skin at bedtime. Currently using 15 units   . lisinopril (PRINIVIL,ZESTRIL) 40 MG tablet TAKE 1 TABLET BY MOUTH DAILY   . mometasone (NASONEX) 50 MCG/ACT nasal spray PLACE 2 SPRAYS INTO THE NOSE AT BEDTIME. 10/11/2016: PRN   . Multiple Vitamin (MULTIVITAMIN WITH MINERALS) TABS tablet Take 1 tablet by mouth daily.   . mycophenolate (MYFORTIC) 180 MG EC tablet Take 540 mg by mouth 2 (two) times daily.    Marland Kitchen omeprazole (PRILOSEC) 20 MG capsule TAKE 1 CAPSULE BY MOUTH ONCE DAILY   . potassium chloride (K-DUR,KLOR-CON) 10 MEQ tablet Take 20 mEq by mouth 2 (two) times daily.    . tacrolimus (PROGRAF) 1 MG capsule Take 1 mg by mouth 2 (two) times daily.   . cephALEXin (KEFLEX) 500 MG capsule Take 1 capsule (500 mg total) by mouth 3 (three) times daily. (Patient not taking: Reported on 11/04/2016)   . CIALIS 20 MG tablet TAKE 1/2 TO 1 TABLET BY MOUTH EVERY OTHER DAY AS NEEDED (Patient not taking: Reported on 11/04/2016)   . eplerenone (INSPRA) 25 MG tablet Take 1 tablet (25 mg total) by mouth daily. (Patient not taking: Reported on 11/04/2016)    . furosemide (LASIX) 40 MG tablet Take 80 mg by mouth once.   . rosuvastatin (CRESTOR) 10 MG tablet Take 1 tablet (10 mg total) by mouth daily. (Patient taking differently: Take 10 mg by mouth daily. taking)   . torsemide (DEMADEX) 20 MG tablet Take 1 tablet (20 mg total) by mouth 2 (two) times daily. (Patient not taking: Reported on 11/04/2016)    No facility-administered encounter medications on file as of 11/04/2016.     Functional Status:  In your present state of health, do you have any difficulty performing the following activities: 11/04/2016 02/26/2016  Hearing? N N  Vision? N Y  Difficulty concentrating or making decisions? Y N  Walking or climbing stairs? Y N  Comment when he is carrying fluid -  Dressing or bathing? N N  Doing errands, shopping? N N  Some recent data might be hidden    Fall/Depression Screening: Fall Risk  11/04/2016 06/18/2016 02/26/2016  Falls in the past year? No Yes -  Comment - July 2017- ran into a table -  Number falls in past yr: - 1 (No Data)  Comment - at the gym- tripped on a ledge july 2017  Injury with Fall? - No -  Risk for fall due to : - History of fall(s);Medication side effect Impaired balance/gait;History of fall(s)   PHQ 2/9 Scores 11/04/2016 06/18/2016 02/26/2016  01/17/2016 01/17/2016 11/29/2015 10/10/2015  PHQ - 2 Score _0 0 0 0 2  PHQ- 9 Score 6 22 - - - - -    Assessment:   Plan:

## 2016-11-04 NOTE — Patient Outreach (Signed)
[name] [facility]) Care Management  [place]  11/04/2016   [name] June 12, 1960 [id number]  Subjective: Met with patient today for his [facility] diabetes visit.  He looks well , his vital signs stable, review of recent labs- excellent but patient has gained 6.9lbs since our last visit (05/2016) and c/o SOB when he is lying down.  He is having multiple low blood sugars per week(4-5) and has decreased his Lantus from 20 units a day, down to 15 units /day.  He is NOT requiring any Novolog.Taking all other medications as ordered.  Recent A1C 6.1%. Reports fasting blood sugars "no higher than 170m/dl".   No c/o chest pain.  Is walking daily for 20 minutes. He reports that all the skin on his legs is intact and his feet are healthy.  He saw a podiatrist last week for tendontis in his left foot- no pain today.   Objective:  Vitals:   11/04/16 0952  BP: 132/66  Pulse: 69  SpO2: 98%    Encounter Medications:  Outpatient Encounter Prescriptions as of 11/04/2016  Medication Sig Note  . amLODipine (NORVASC) 5 MG tablet TAKE 1 TABLET BY MOUTH DAILY   . beclomethasone (QVAR) 80 MCG/ACT inhaler Inhale 2 puffs into the lungs 2 (two) times daily. 10/11/2016: PRN  . buPROPion (WELLBUTRIN) 75 MG tablet Take 1 tablet (75 mg total) by mouth 2 (two) times daily.   .Marland KitchenELIQUIS 5 MG TABS tablet TAKE 1 TABLET BY MOUTH 2 TIMES DAILY   . insulin aspart (NOVOLOG FLEXPEN) 100 UNIT/ML FlexPen Inject 5 Units into the skin 3 (three) times daily with meals as needed for high blood sugar. Pt uses as needed per sliding scale. Patient is taking 0-5 units tid   . insulin glargine (LANTUS) 100 unit/mL SOPN Inject 20 Units into the skin at bedtime. Currently using 15 units   . lisinopril (PRINIVIL,ZESTRIL) 40 MG tablet TAKE 1 TABLET BY MOUTH DAILY   . mometasone (NASONEX) 50 MCG/ACT nasal spray PLACE 2 SPRAYS INTO THE NOSE AT BEDTIME. 10/11/2016: PRN   . Multiple Vitamin (MULTIVITAMIN WITH  MINERALS) TABS tablet Take 1 tablet by mouth daily.   . mycophenolate (MYFORTIC) 180 MG EC tablet Take 540 mg by mouth 2 (two) times daily.    .Marland Kitchenomeprazole (PRILOSEC) 20 MG capsule TAKE 1 CAPSULE BY MOUTH ONCE DAILY   . potassium chloride (K-DUR,KLOR-CON) 10 MEQ tablet Take 20 mEq by mouth 2 (two) times daily.    . tacrolimus (PROGRAF) 1 MG capsule Take 1 mg by mouth 2 (two) times daily.   . cephALEXin (KEFLEX) 500 MG capsule Take 1 capsule (500 mg total) by mouth 3 (three) times daily. (Patient not taking: Reported on 11/04/2016)   . CIALIS 20 MG tablet TAKE 1/2 TO 1 TABLET BY MOUTH EVERY OTHER DAY AS NEEDED (Patient not taking: Reported on 11/04/2016)   . eplerenone (INSPRA) 25 MG tablet Take 1 tablet (25 mg total) by mouth daily. (Patient not taking: Reported on 11/04/2016)   . furosemide (LASIX) 40 MG tablet Take 80 mg by mouth once.   . rosuvastatin (CRESTOR) 10 MG tablet Take 1 tablet (10 mg total) by mouth daily. (Patient taking differently: Take 10 mg by mouth daily. taking)   . torsemide (DEMADEX) 20 MG tablet Take 1 tablet (20 mg total) by mouth 2 (two) times daily. (Patient not taking: Reported on 11/04/2016)    No facility-administered encounter medications on file as of 11/04/2016.     Functional  Status:  In your present state of health, do you have any difficulty performing the following activities: 11/04/2016 02/26/2016  Hearing? N N  Vision? N Y  Difficulty concentrating or making decisions? Y N  Walking or climbing stairs? Y N  Comment when he is carrying fluid -  Dressing or bathing? N N  Doing errands, shopping? N N  Some recent data might be hidden    Fall/Depression Screening: Fall Risk  11/04/2016 06/18/2016 02/26/2016  Falls in the past year? No Yes -  Comment - July 2017- ran into a table -  Number falls in past yr: - 1 (No Data)  Comment - at the gym- tripped on a ledge july 2017  Injury with Fall? - No -  Risk for fall due to : - History of fall(s);Medication side  effect Impaired balance/gait;History of fall(s)   PHQ 2/9 Scores 11/04/2016 06/18/2016 02/26/2016 01/17/2016 01/17/2016 11/29/2015 10/10/2015  PHQ - 2 Score _0 0 0 0 2  PHQ- 9 Score 6 22 - - - - -    Assessment: Engaged patient who is concerned about how he feels. He is active and able to function but "don't feel right".  He is having multiple low blood sugars.  Plan:  [facility] CM Care Plan Problem Two     Most Recent Value  Care Plan Problem Two  Potential for elevated A1C > 7%  Role Documenting the Problem Two  Care Management Coordinator  Care Plan for Problem Two  Active  Interventions for Problem Two Long Term Goal   Exercise 150 minutes/week, continue checking blood sugars at least once a day, eat a snack with carbohydrate and protein at hs if bedtime blood sugar is less than 141m/dl, write down your weight daily  [facility] Long Term Goal  Patient will maintain A1C less than 7%  [facility] Long Term Goal Start Date  11/04/16     Follow up in 3 months or as needed.   JGentry Fitz RN, BA, [place] CN[place]Direct Dial:  [phone] Fax:  [phone]-mail: [email or web address] [address] [place]  [id number]

## 2016-11-11 ENCOUNTER — Encounter: Payer: Self-pay | Admitting: Cardiovascular Disease

## 2016-11-11 ENCOUNTER — Inpatient Hospital Stay
Admission: AD | Admit: 2016-11-11 | Discharge: 2016-11-16 | DRG: 287 | Disposition: A | Discharge status: Home / Self Care | Payer: 59 | Source: Ambulatory Visit | Attending: Internal Medicine | Admitting: Internal Medicine

## 2016-11-11 ENCOUNTER — Inpatient Hospital Stay: Payer: 59

## 2016-11-11 ENCOUNTER — Ambulatory Visit (INDEPENDENT_AMBULATORY_CARE_PROVIDER_SITE_OTHER): Payer: 59 | Admitting: Cardiovascular Disease

## 2016-11-11 VITALS — BP 138/78 | HR 58 | Ht 71.0 in | Wt 297.2 lb

## 2016-11-11 DIAGNOSIS — R0602 Shortness of breath: Secondary | ICD-10-CM | POA: Diagnosis not present

## 2016-11-11 DIAGNOSIS — Z8249 Family history of ischemic heart disease and other diseases of the circulatory system: Secondary | ICD-10-CM

## 2016-11-11 DIAGNOSIS — Z951 Presence of aortocoronary bypass graft: Secondary | ICD-10-CM

## 2016-11-11 DIAGNOSIS — Z800 Family history of malignant neoplasm of digestive organs: Secondary | ICD-10-CM | POA: Diagnosis not present

## 2016-11-11 DIAGNOSIS — D638 Anemia in other chronic diseases classified elsewhere: Secondary | ICD-10-CM | POA: Diagnosis present

## 2016-11-11 DIAGNOSIS — I4891 Unspecified atrial fibrillation: Secondary | ICD-10-CM | POA: Diagnosis not present

## 2016-11-11 DIAGNOSIS — I272 Pulmonary hypertension, unspecified: Secondary | ICD-10-CM | POA: Diagnosis present

## 2016-11-11 DIAGNOSIS — K219 Gastro-esophageal reflux disease without esophagitis: Secondary | ICD-10-CM | POA: Diagnosis present

## 2016-11-11 DIAGNOSIS — R079 Chest pain, unspecified: Secondary | ICD-10-CM | POA: Diagnosis not present

## 2016-11-11 DIAGNOSIS — I482 Chronic atrial fibrillation, unspecified: Secondary | ICD-10-CM

## 2016-11-11 DIAGNOSIS — I5023 Acute on chronic systolic (congestive) heart failure: Secondary | ICD-10-CM | POA: Diagnosis not present

## 2016-11-11 DIAGNOSIS — I2510 Atherosclerotic heart disease of native coronary artery without angina pectoris: Secondary | ICD-10-CM

## 2016-11-11 DIAGNOSIS — I50813 Acute on chronic right heart failure: Secondary | ICD-10-CM | POA: Diagnosis present

## 2016-11-11 DIAGNOSIS — E1169 Type 2 diabetes mellitus with other specified complication: Secondary | ICD-10-CM | POA: Diagnosis present

## 2016-11-11 DIAGNOSIS — I110 Hypertensive heart disease with heart failure: Principal | ICD-10-CM | POA: Diagnosis present

## 2016-11-11 DIAGNOSIS — Z803 Family history of malignant neoplasm of breast: Secondary | ICD-10-CM

## 2016-11-11 DIAGNOSIS — Z841 Family history of disorders of kidney and ureter: Secondary | ICD-10-CM | POA: Diagnosis not present

## 2016-11-11 DIAGNOSIS — I509 Heart failure, unspecified: Secondary | ICD-10-CM

## 2016-11-11 DIAGNOSIS — N186 End stage renal disease: Secondary | ICD-10-CM | POA: Diagnosis present

## 2016-11-11 DIAGNOSIS — R001 Bradycardia, unspecified: Secondary | ICD-10-CM | POA: Diagnosis not present

## 2016-11-11 DIAGNOSIS — R601 Generalized edema: Secondary | ICD-10-CM | POA: Diagnosis not present

## 2016-11-11 DIAGNOSIS — I10 Essential (primary) hypertension: Secondary | ICD-10-CM

## 2016-11-11 DIAGNOSIS — Z6838 Body mass index (BMI) 38.0-38.9, adult: Secondary | ICD-10-CM

## 2016-11-11 DIAGNOSIS — Z7901 Long term (current) use of anticoagulants: Secondary | ICD-10-CM

## 2016-11-11 DIAGNOSIS — I5022 Chronic systolic (congestive) heart failure: Secondary | ICD-10-CM | POA: Diagnosis not present

## 2016-11-11 DIAGNOSIS — I25118 Atherosclerotic heart disease of native coronary artery with other forms of angina pectoris: Secondary | ICD-10-CM | POA: Diagnosis not present

## 2016-11-11 DIAGNOSIS — Z87891 Personal history of nicotine dependence: Secondary | ICD-10-CM

## 2016-11-11 DIAGNOSIS — Z940 Kidney transplant status: Secondary | ICD-10-CM | POA: Diagnosis not present

## 2016-11-11 DIAGNOSIS — N179 Acute kidney failure, unspecified: Secondary | ICD-10-CM | POA: Diagnosis not present

## 2016-11-11 DIAGNOSIS — I428 Other cardiomyopathies: Secondary | ICD-10-CM | POA: Diagnosis present

## 2016-11-11 DIAGNOSIS — I480 Paroxysmal atrial fibrillation: Secondary | ICD-10-CM | POA: Diagnosis present

## 2016-11-11 DIAGNOSIS — Z9884 Bariatric surgery status: Secondary | ICD-10-CM

## 2016-11-11 DIAGNOSIS — E669 Obesity, unspecified: Secondary | ICD-10-CM | POA: Diagnosis present

## 2016-11-11 DIAGNOSIS — Z794 Long term (current) use of insulin: Secondary | ICD-10-CM | POA: Diagnosis not present

## 2016-11-11 DIAGNOSIS — Z833 Family history of diabetes mellitus: Secondary | ICD-10-CM

## 2016-11-11 DIAGNOSIS — Z8619 Personal history of other infectious and parasitic diseases: Secondary | ICD-10-CM | POA: Diagnosis not present

## 2016-11-11 DIAGNOSIS — N289 Disorder of kidney and ureter, unspecified: Secondary | ICD-10-CM | POA: Diagnosis not present

## 2016-11-11 DIAGNOSIS — I872 Venous insufficiency (chronic) (peripheral): Secondary | ICD-10-CM | POA: Diagnosis present

## 2016-11-11 DIAGNOSIS — E1129 Type 2 diabetes mellitus with other diabetic kidney complication: Secondary | ICD-10-CM | POA: Diagnosis present

## 2016-11-11 DIAGNOSIS — E876 Hypokalemia: Secondary | ICD-10-CM | POA: Diagnosis not present

## 2016-11-11 DIAGNOSIS — E785 Hyperlipidemia, unspecified: Secondary | ICD-10-CM | POA: Diagnosis present

## 2016-11-11 DIAGNOSIS — E1121 Type 2 diabetes mellitus with diabetic nephropathy: Secondary | ICD-10-CM | POA: Diagnosis not present

## 2016-11-11 DIAGNOSIS — Z885 Allergy status to narcotic agent status: Secondary | ICD-10-CM

## 2016-11-11 DIAGNOSIS — Z79899 Other long term (current) drug therapy: Secondary | ICD-10-CM | POA: Diagnosis not present

## 2016-11-11 DIAGNOSIS — M791 Myalgia: Secondary | ICD-10-CM | POA: Diagnosis present

## 2016-11-11 DIAGNOSIS — E119 Type 2 diabetes mellitus without complications: Secondary | ICD-10-CM | POA: Diagnosis present

## 2016-11-11 DIAGNOSIS — I5033 Acute on chronic diastolic (congestive) heart failure: Secondary | ICD-10-CM | POA: Diagnosis not present

## 2016-11-11 LAB — URINALYSIS, COMPLETE (UACMP) WITH MICROSCOPIC
BACTERIA UA: NONE SEEN
BILIRUBIN URINE: NEGATIVE
GLUCOSE, UA: NEGATIVE mg/dL
Ketones, ur: NEGATIVE mg/dL
LEUKOCYTES UA: NEGATIVE
Nitrite: NEGATIVE
PROTEIN: NEGATIVE mg/dL
SQUAMOUS EPITHELIAL / LPF: NONE SEEN
Specific Gravity, Urine: 1.009 (ref 1.005–1.030)
pH: 5 (ref 5.0–8.0)

## 2016-11-11 LAB — COMPREHENSIVE METABOLIC PANEL
ALK PHOS: 125 U/L (ref 38–126)
ALT: 13 U/L — AB (ref 17–63)
AST: 27 U/L (ref 15–41)
Albumin: 3.8 g/dL (ref 3.5–5.0)
Anion gap: 7 (ref 5–15)
BILIRUBIN TOTAL: 1.2 mg/dL (ref 0.3–1.2)
BUN: 21 mg/dL — ABNORMAL HIGH (ref 6–20)
CALCIUM: 9.2 mg/dL (ref 8.9–10.3)
CO2: 29 mmol/L (ref 22–32)
CREATININE: 1.09 mg/dL (ref 0.61–1.24)
Chloride: 105 mmol/L (ref 101–111)
GFR calc non Af Amer: >60 mL/min (ref >60)
Glucose, Bld: 91 mg/dL (ref 65–99)
Potassium: 3.7 mmol/L (ref 3.5–5.1)
SODIUM: 141 mmol/L (ref 135–145)
TOTAL PROTEIN: 8.2 g/dL — AB (ref 6.5–8.1)

## 2016-11-11 LAB — TSH: TSH: 2.513 u[IU]/mL (ref 0.350–4.500)

## 2016-11-11 LAB — HEPARIN LEVEL (UNFRACTIONATED): Heparin Unfractionated: 1.64 IU/mL — ABNORMAL HIGH (ref 0.30–0.70)

## 2016-11-11 LAB — GLUCOSE, CAPILLARY: GLUCOSE-CAPILLARY: 142 mg/dL — AB (ref 65–99)

## 2016-11-11 LAB — CBC
HEMATOCRIT: 34.9 % — AB (ref 40.0–52.0)
HEMOGLOBIN: 11.1 g/dL — AB (ref 13.0–18.0)
MCH: 23 pg — AB (ref 26.0–34.0)
MCHC: 31.9 g/dL — AB (ref 32.0–36.0)
MCV: 72 fL — AB (ref 80.0–100.0)
Platelets: 155 10*3/uL (ref 150–440)
RBC: 4.85 MIL/uL (ref 4.40–5.90)
RDW: 18.1 % — ABNORMAL HIGH (ref 11.5–14.5)
WBC: 4.9 10*3/uL (ref 3.8–10.6)

## 2016-11-11 LAB — APTT: aPTT: 44 seconds — ABNORMAL HIGH (ref 24–36)

## 2016-11-11 LAB — PROTIME-INR
INR: 1.77
PROTHROMBIN TIME: 20.8 s — AB (ref 11.4–15.2)

## 2016-11-11 LAB — CK: Total CK: 79 U/L (ref 49–397)

## 2016-11-11 MED ORDER — AMLODIPINE BESYLATE 5 MG PO TABS
5.0000 mg | ORAL_TABLET | Freq: Every day | ORAL | Status: DC
  Administered 2016-11-11 – 2016-11-16 (×6): 5 mg via ORAL
  Filled 2016-11-11 (×6): qty 1

## 2016-11-11 MED ORDER — SULFAMETHOXAZOLE-TRIMETHOPRIM 400-80 MG PO TABS
1.0000 | ORAL_TABLET | ORAL | Status: DC
  Administered 2016-11-11 – 2016-11-15 (×3): 1 via ORAL
  Filled 2016-11-11 (×3): qty 1

## 2016-11-11 MED ORDER — PANTOPRAZOLE SODIUM 40 MG PO TBEC
40.0000 mg | DELAYED_RELEASE_TABLET | Freq: Every day | ORAL | Status: DC
  Administered 2016-11-12 – 2016-11-16 (×5): 40 mg via ORAL
  Filled 2016-11-11 (×5): qty 1

## 2016-11-11 MED ORDER — INSULIN GLARGINE 100 UNITS/ML SOLOSTAR PEN
15.0000 [IU] | PEN_INJECTOR | Freq: Every day | SUBCUTANEOUS | Status: DC
  Filled 2016-11-11: qty 3

## 2016-11-11 MED ORDER — SPIRONOLACTONE 25 MG PO TABS
25.0000 mg | ORAL_TABLET | Freq: Every day | ORAL | Status: DC
  Administered 2016-11-13 – 2016-11-16 (×4): 25 mg via ORAL
  Filled 2016-11-11 (×6): qty 1

## 2016-11-11 MED ORDER — POTASSIUM CHLORIDE CRYS ER 10 MEQ PO TBCR
20.0000 meq | EXTENDED_RELEASE_TABLET | Freq: Two times a day (BID) | ORAL | Status: DC
  Filled 2016-11-11 (×2): qty 1

## 2016-11-11 MED ORDER — INSULIN GLARGINE 100 UNIT/ML ~~LOC~~ SOLN
15.0000 [IU] | Freq: Every day | SUBCUTANEOUS | Status: DC
  Administered 2016-11-11 – 2016-11-12 (×2): 15 [IU] via SUBCUTANEOUS
  Filled 2016-11-11 (×3): qty 0.15

## 2016-11-11 MED ORDER — ONDANSETRON HCL 4 MG/2ML IJ SOLN
4.0000 mg | Freq: Four times a day (QID) | INTRAMUSCULAR | Status: DC | PRN
  Administered 2016-11-14: 4 mg via INTRAVENOUS
  Filled 2016-11-11: qty 2

## 2016-11-11 MED ORDER — LISINOPRIL 20 MG PO TABS
40.0000 mg | ORAL_TABLET | Freq: Every day | ORAL | Status: DC
  Administered 2016-11-12 – 2016-11-16 (×5): 40 mg via ORAL
  Filled 2016-11-11 (×5): qty 2

## 2016-11-11 MED ORDER — FLUTICASONE PROPIONATE 50 MCG/ACT NA SUSP
2.0000 | Freq: Every day | NASAL | Status: DC | PRN
  Filled 2016-11-11: qty 16

## 2016-11-11 MED ORDER — MYCOPHENOLATE SODIUM 180 MG PO TBEC
360.0000 mg | DELAYED_RELEASE_TABLET | Freq: Two times a day (BID) | ORAL | Status: DC
  Administered 2016-11-11 – 2016-11-16 (×10): 360 mg via ORAL
  Filled 2016-11-11 (×11): qty 2

## 2016-11-11 MED ORDER — INSULIN ASPART 100 UNIT/ML ~~LOC~~ SOLN: 0.0000 [IU] | Freq: Every day | SUBCUTANEOUS | Status: DC

## 2016-11-11 MED ORDER — ACETAMINOPHEN 325 MG PO TABS: 650.0000 mg | ORAL_TABLET | Freq: Four times a day (QID) | ORAL | Status: DC | PRN

## 2016-11-11 MED ORDER — HEPARIN (PORCINE) IN NACL 100-0.45 UNIT/ML-% IJ SOLN
1500.0000 [IU]/h | INTRAMUSCULAR | Status: DC
  Administered 2016-11-11: 1250 [IU]/h via INTRAVENOUS
  Administered 2016-11-12: 1500 [IU]/h via INTRAVENOUS
  Filled 2016-11-11 (×2): qty 250

## 2016-11-11 MED ORDER — SODIUM CHLORIDE 0.9% FLUSH
3.0000 mL | Freq: Two times a day (BID) | INTRAVENOUS | Status: DC
  Administered 2016-11-11 – 2016-11-15 (×8): 3 mL via INTRAVENOUS

## 2016-11-11 MED ORDER — ACETAMINOPHEN 650 MG RE SUPP
650.0000 mg | Freq: Four times a day (QID) | RECTAL | Status: DC | PRN
Start: 2016-11-11 — End: 2016-11-16

## 2016-11-11 MED ORDER — FUROSEMIDE 10 MG/ML IJ SOLN
40.0000 mg | Freq: Two times a day (BID) | INTRAMUSCULAR | Status: DC
  Administered 2016-11-11 – 2016-11-13 (×4): 40 mg via INTRAVENOUS
  Filled 2016-11-11 (×4): qty 4

## 2016-11-11 MED ORDER — INSULIN ASPART 100 UNIT/ML ~~LOC~~ SOLN
0.0000 [IU] | Freq: Three times a day (TID) | SUBCUTANEOUS | Status: DC
  Administered 2016-11-12: 1 [IU] via SUBCUTANEOUS
  Administered 2016-11-16: 2 [IU] via SUBCUTANEOUS
  Filled 2016-11-11 (×2): qty 1

## 2016-11-11 MED ORDER — ONDANSETRON HCL 4 MG PO TABS: 4.0000 mg | ORAL_TABLET | Freq: Four times a day (QID) | ORAL | Status: DC | PRN

## 2016-11-11 MED ORDER — INSULIN ASPART 100 UNIT/ML FLEXPEN: 5.0000 [IU] | PEN_INJECTOR | Freq: Three times a day (TID) | SUBCUTANEOUS | Status: DC | PRN

## 2016-11-11 MED ORDER — BUPROPION HCL 75 MG PO TABS
75.0000 mg | ORAL_TABLET | Freq: Two times a day (BID) | ORAL | Status: DC
  Administered 2016-11-11 – 2016-11-16 (×10): 75 mg via ORAL
  Filled 2016-11-11 (×12): qty 1

## 2016-11-11 MED ORDER — ADULT MULTIVITAMIN W/MINERALS CH
1.0000 | ORAL_TABLET | Freq: Every day | ORAL | Status: DC
  Administered 2016-11-12 – 2016-11-16 (×5): 1 via ORAL
  Filled 2016-11-11 (×5): qty 1

## 2016-11-11 MED ORDER — TACROLIMUS 1 MG PO CAPS
1.0000 mg | ORAL_CAPSULE | Freq: Two times a day (BID) | ORAL | Status: DC
  Administered 2016-11-11 – 2016-11-16 (×10): 1 mg via ORAL
  Filled 2016-11-11 (×11): qty 1

## 2016-11-11 MED ORDER — INSULIN GLARGINE 100 UNITS/ML SOLOSTAR PEN: 20.0000 [IU] | PEN_INJECTOR | Freq: Every day | SUBCUTANEOUS | Status: DC

## 2016-11-11 NOTE — Progress Notes (Signed)
Cardiology Office Note   Date:  11/11/2016   ID:  [name], [name] 1960-06-28, MRN [id number]  PCP:  [name], MD  Cardiologist:   [name], MD   Chief Complaint  Patient presents with  . other    follow up from ECHO. Patient c/o retaining fluid and not being able to breath at night. Meds reviewed verbally with patient.       History of Present Illness: [name] is a 56 y.o. male who presents for  a follow up visit. He has known history of coronary artery disease status post CABG in 2004 with no recurrent ischemic events since then, chronic systolic heart failure with ejection fraction of 35-40 %, type 2 diabetes, end-stage renal disease previously on hemodialysis from 2004 - 2012 when he got a kidney transplant, hypertension, hyperlipidemia, chronic atrial fibrillation and sleep apnea.   She underwent gastric bypass in September 6773 without complications.  He did have significant bradycardia while he was on small dose carvedilol which was discontinued.  He had significant worsening of right-sided heart failure over the last few months with significant abdominal swelling, leg edema and weight gain. I repeated his echocardiogram that was a difficult study overall. It showed an EF of 73%, diastolic function could not be determined and pulmonary pressure could not be calculated accurately.  I added eplerenone and switched furosemide to torsemide 20 mg twice daily. He had no significant improvement. He was seen by his transplant team recently and was switched back to furosemide 80 mg once daily. In spite of that, he continued to go in weight with significant swelling in his abdomen. He is more out of breath with severe orthopnea. He is having hard time sleeping at night. No chest pain.     Past Medical History:  Diagnosis Date  . Allergy    seasonal  . Amnestic MCI (mild cognitive impairment with memory loss)   . Asthma   . Atrial fibrillation ([place])   .  Atrial fibrillation ([place]) 2016  . Benign neoplasm of colon   . CAD (coronary artery disease) 2005   had CABG  . CHF (congestive heart failure) ([place])   . Chronic venous stasis dermatitis of both lower extremities   . Cytomegaloviral disease ([place]) 2017  . Depression   . Diabetes mellitus   . Diverticulosis   . Dyspnea   . Erectile dysfunction   . ESRD (end stage renal disease) ([place]) 2005   Dialysis then renal transplant  . GERD (gastroesophageal reflux disease)   . Gout   . Hyperlipidemia    low since renal failure  . Hypertension   . Kidney transplant status, cadaveric 2012  . Obesity   . Pneumonia   . Purpura ([place])   . Sleep apnea    bipap with oxygen 2 l  . Trigger finger   . Ulcer 05/2016   Left shin  . Wears glasses     Past Surgical History:  Procedure Laterality Date  . BARIATRIC SURGERY    . CARDIAC CATHETERIZATION     [facility]  . COLONOSCOPY WITH PROPOFOL N/A 04/22/2013   Procedure: COLONOSCOPY WITH PROPOFOL;  Surgeon: [name], MD;  Location: [facility];  Service: Endoscopy;  Laterality: N/A;  . CORONARY ARTERY BYPASS GRAFT  2005   X 4  . DG ANGIO AV SHUNT*L*     right and left upper arms  . FASCIOTOMY  03/03/2012   Procedure: FASCIOTOMY;  Surgeon: [name],  MD;  Location: [facility];  Service: Orthopedics;  Laterality: Right;  FASCIOTOMY RIGHT SMALL FINGER  . FASCIOTOMY Left 08/17/2013   Procedure: FASCIOTOMY LEFT RING;  Surgeon: [name], MD;  Location: [place];  Service: Orthopedics;  Laterality: Left;  . INCISION AND DRAINAGE ABSCESS Left 10/15/2015   Procedure: INCISION AND DRAINAGE ABSCESS;  Surgeon: [name], MD;  Location: [facility] ORS;  Service: General;  Laterality: Left;  . KIDNEY TRANSPLANT  09/13/2010   cadaver--at [facility]  1/12   left  . VASECTOMY       Current Outpatient Prescriptions  Medication Sig Dispense Refill  . amLODipine (NORVASC) 5 MG tablet TAKE 1 TABLET  BY MOUTH DAILY 90 tablet 3  . beclomethasone (QVAR) 80 MCG/ACT inhaler Inhale 2 puffs into the lungs 2 (two) times daily. 1 Inhaler 6  . buPROPion (WELLBUTRIN) 75 MG tablet Take 1 tablet (75 mg total) by mouth 2 (two) times daily. 180 tablet 3  . cephALEXin (KEFLEX) 500 MG capsule Take 1 capsule (500 mg total) by mouth 3 (three) times daily. 30 capsule 0  . CIALIS 20 MG tablet TAKE 1/2 TO 1 TABLET BY MOUTH EVERY OTHER DAY AS NEEDED 5 tablet 11  . ELIQUIS 5 MG TABS tablet TAKE 1 TABLET BY MOUTH 2 TIMES DAILY 60 tablet 3  . eplerenone (INSPRA) 25 MG tablet Take 1 tablet (25 mg total) by mouth daily. 90 tablet 3  . furosemide (LASIX) 40 MG tablet Take 80 mg by mouth once.  0  . insulin aspart (NOVOLOG FLEXPEN) 100 UNIT/ML FlexPen Inject 5 Units into the skin 3 (three) times daily with meals as needed for high blood sugar. Pt uses as needed per sliding scale. Patient is taking 0-5 units tid    . insulin glargine (LANTUS) 100 unit/mL SOPN Inject 20 Units into the skin at bedtime. Currently using 15 units    . lisinopril (PRINIVIL,ZESTRIL) 40 MG tablet TAKE 1 TABLET BY MOUTH DAILY 30 tablet 3  . mometasone (NASONEX) 50 MCG/ACT nasal spray PLACE 2 SPRAYS INTO THE NOSE AT BEDTIME. 17 g 0  . Multiple Vitamin (MULTIVITAMIN WITH MINERALS) TABS tablet Take 1 tablet by mouth daily.    . mycophenolate (MYFORTIC) 180 MG EC tablet Take 540 mg by mouth 2 (two) times daily.     Marland Kitchen omeprazole (PRILOSEC) 20 MG capsule TAKE 1 CAPSULE BY MOUTH ONCE DAILY 90 capsule 3  . potassium chloride (K-DUR,KLOR-CON) 10 MEQ tablet Take 20 mEq by mouth 2 (two) times daily.     . tacrolimus (PROGRAF) 1 MG capsule Take 1 mg by mouth 2 (two) times daily.    Marland Kitchen torsemide (DEMADEX) 20 MG tablet Take 1 tablet (20 mg total) by mouth 2 (two) times daily. 60 tablet 3  . rosuvastatin (CRESTOR) 10 MG tablet Take 1 tablet (10 mg total) by mouth daily. (Patient taking differently: Take 10 mg by mouth daily. taking) 90 tablet 3   No current  facility-administered medications for this visit.     Allergies:   Morphine    Social History:  The patient  reports that he quit smoking about 22 years ago. His smoking use included Cigars. He has never used smokeless tobacco. He reports that he drinks alcohol. He reports that he does not use drugs.   Family History:  The patient's family history includes Breast cancer in his maternal grandmother; Diabetes in his maternal grandmother; Heart disease in his father; Kidney disease  in his father; Kidney failure in his father; Liver cancer in his paternal grandmother and paternal uncle; Valvular heart disease in his mother.    ROS:  Please see the history of present illness.   Otherwise, review of systems are positive for none.   All other systems are reviewed and negative.    PHYSICAL EXAM: VS:  BP 138/78 (BP Location: Right Arm, Patient Position: Sitting, Cuff Size: Large)   Pulse (!) 58   Ht _0  (1.803 m)   Wt 297 lb 4 oz (134.8 kg)   BMI 41.46 kg/m  , BMI Body mass index is 41.46 kg/m. GEN: Well nourished, well developed, in no acute distress  HEENT: normal  Neck: Significant  JVD to earlobe,  no carotid bruits, or masses Cardiac: Irregularly irregular; no  rubs, or gallops, moderate leg  edema . 2/6 systolic ejection murmur in the aortic area. Respiratory:  clear to auscultation bilaterally, normal work of breathing GI: soft, nontender, , + BS.  Moderately distended abdomen with possible ascites.  MS: no deformity or atrophy  Skin: warm and dry, no rash Neuro:  Strength and sensation are intact Psych: euthymic mood, full affect   EKG:  EKG is ordered today. The ekg ordered today demonstrates atrial fibrillation with nonspecific IVCD.  Ventricular rate is 59 bpm.   Recent Labs: 03/06/2016: ALT 14 10/25/2016: BUN 22; Creatinine, Ser 1.40; Potassium 4.0; Sodium 140    Lipid Panel    Component Value Date/Time   CHOL 92 03/06/2016 0736   CHOL 161 03/07/2015 0823   TRIG 73  03/06/2016 0736   HDL 34 (L) 03/06/2016 0736   HDL 38 (L) 03/07/2015 0823   CHOLHDL 2.7 03/06/2016 0736   VLDL 15 03/06/2016 0736   LDLCALC 43 03/06/2016 0736   LDLCALC 105 (H) 03/07/2015 0823   LDLDIRECT 41.9 04/20/2012 1618      Wt Readings from Last 3 Encounters:  11/11/16 297 lb 4 oz (134.8 kg)  11/04/16 290 lb 1.6 oz (131.6 kg)  10/14/16 287 lb 12 oz (130.5 kg)       No flowsheet data found.    ASSESSMENT AND PLAN:  1. Acute on chronic systolic heart failure:  The patient appears to be significantly volume overloaded.He is 22 pounds above his dry weight. We have failed with outpatient diuresis including oral torsemide and furosemide.  Due to that, I recommend hospital admission for IV diuresis with furosemide 40 mg twice daily to start with. He might require a higher dose depending on his response. We should try to get his weight down to around 275 pounds. No need to repeat echocardiogram. He is not on a beta blocker due to previous bradycardia on carvedilol. Continue lisinopril 40 mg daily.  I recommend a right heart catheterization in few days after diuresis and holding Eliquis. I am concerned about the degree of his right-sided heart failure and we might have to look for other etiologies for this.  2. Chronic atrial fibrillation: Rate is controlled without medications. He is tolerating anticoagulation with Eliquis. I don't think atrial fibrillation is responsible for his recent volume overload as he has been in atrial fibrillation since 2016. Hold Eliquis and start heparin drip tomorrow in anticipation of right heart catheterization.  3. Coronary artery disease involving native coronary arteries without angina: Continue medical therapy.   4. Hyperlipidemia: Continue treatment with rosuvastatin 10 mg daily. Most recent LDL was 43.   5. Status post kidney transplant: Recommend nephrology consultation to assist with his  care in ruling out significant proteinuria or  hypoalbuminemia causing some of his issues.  Disposition:   Direct hospital admission to telemetry. I discussed the case with Dr. Naaman Plummer   Signed,  [name], MD  11/11/2016 2:56 PM    [place]

## 2016-11-11 NOTE — Care Management (Signed)
Screen due to diagnosis.  Patient presents form home where is independent in all his adls, no issues accessing medical care, transportation or obtaining meds.  Direct admit for fluid overloads that did not respond to outpatient attempts to resolve.  S/p kidney transplant approximately 5 years prior.

## 2016-11-11 NOTE — Patient Instructions (Signed)
Direct admit to telemetry.

## 2016-11-11 NOTE — H&P (Signed)
[name] NAME: [name]    MR#:  [id number]  DATE OF BIRTH:  Jun 23, 1960  DATE OF ADMISSION:  11/11/2016  PRIMARY CARE PHYSICIAN: [name], MD   REQUESTING/REFERRING PHYSICIAN: Dr. Annia Belt  CHIEF COMPLAINT:  Sent in for weight gain  HISTORY OF PRESENT ILLNESS:  Shaft Dise  is a 56 y.o. male with a known history of renal transplant, diabetes, coronary artery disease, congestive heart failure and atrial fibrillation. He has been gaining weight over the last few weeks. He has met with his transplant team and cardiology team. He has gained 22 pounds. His diuretics haven't been working. He had a cut back on his diabetic medication secondary to low blood sugars. He's now been feeling short of breath and short of breath with exertion. He also cannot lie flat without feeling uncomfortable.  PAST MEDICAL HISTORY:   Past Medical History:  Diagnosis Date  . Allergy    seasonal  . Amnestic MCI (mild cognitive impairment with memory loss)   . Asthma   . Atrial fibrillation ([place])   . Atrial fibrillation ([place]) 2016  . Benign neoplasm of colon   . CAD (coronary artery disease) 2005   had CABG  . CHF (congestive heart failure) ([place])   . Chronic venous stasis dermatitis of both lower extremities   . Cytomegaloviral disease ([place]) 2017  . Depression   . Diabetes mellitus   . Diverticulosis   . Dyspnea   . Erectile dysfunction   . ESRD (end stage renal disease) ([place]) 2005   Dialysis then renal transplant  . GERD (gastroesophageal reflux disease)   . Gout   . Hyperlipidemia    low since renal failure  . Hypertension   . Kidney transplant status, cadaveric 2012  . Obesity   . Pneumonia   . Purpura ([place])   . Sleep apnea    bipap with oxygen 2 l  . Trigger finger   . Ulcer 05/2016   Left shin  . Wears glasses     PAST SURGICAL HISTORY:   Past Surgical History:  Procedure Laterality Date  .  BARIATRIC SURGERY    . CARDIAC CATHETERIZATION     [facility]  . COLONOSCOPY WITH PROPOFOL N/A 04/22/2013   Procedure: COLONOSCOPY WITH PROPOFOL;  Surgeon: [name], MD;  Location: [facility];  Service: Endoscopy;  Laterality: N/A;  . CORONARY ARTERY BYPASS GRAFT  2005   X 4  . DG ANGIO AV SHUNT*L*     right and left upper arms  . FASCIOTOMY  03/03/2012   Procedure: FASCIOTOMY;  Surgeon: [name], MD;  Location: [facility];  Service: Orthopedics;  Laterality: Right;  FASCIOTOMY RIGHT SMALL FINGER  . FASCIOTOMY Left 08/17/2013   Procedure: FASCIOTOMY LEFT RING;  Surgeon: [name], MD;  Location: [facility];  Service: Orthopedics;  Laterality: Left;  . INCISION AND DRAINAGE ABSCESS Left 10/15/2015   Procedure: INCISION AND DRAINAGE ABSCESS;  Surgeon: [name], MD;  Location: [facility];  Service: General;  Laterality: Left;  . KIDNEY TRANSPLANT  09/13/2010   cadaver--at [place]  1/12   left  . VASECTOMY      SOCIAL HISTORY:   Social History  Substance Use Topics  . Smoking status: Former Smoker    Types: Cigars    Quit date: 09/02/1994  . Smokeless tobacco: Never Used  . Alcohol use 0.0 - 0.6  oz/week     Comment: occasional- 3 in the past year    FAMILY HISTORY:   Family History  Problem Relation Age of Onset  . Heart disease Father   . Kidney failure Father   . Kidney disease Father   . Diabetes Maternal Grandmother   . Breast cancer Maternal Grandmother   . Valvular heart disease Mother   . Liver cancer Paternal Uncle   . Liver cancer Paternal Grandmother   . Prostate cancer Neg Hx     DRUG ALLERGIES:   Allergies  Allergen Reactions  . Morphine Itching    REVIEW OF SYSTEMS:  CONSTITUTIONAL: No fever, fatigue or weakness. Positive for chills. Positive weight gain. EYES: No blurred or double vision. Has cataracts. EARS, NOSE, AND THROAT: No tinnitus or ear pain. No sore throat. Positive for  runny nose RESPIRATORY: positive for cough with lying flat, positive for shortness of breath. no wheezing or hemoptysis.  CARDIOVASCULAR: No chest pain, positive for orthopnea, positive for edema.  GASTROINTESTINAL: No nausea, vomiting, diarrhea or abdominal pain. No blood in bowel movements. Positive for abdominal bloating. GENITOURINARY: No dysuria, hematuria.  ENDOCRINE: No polyuria, nocturia,  HEMATOLOGY: No anemia, easy bruising or bleeding SKIN: No rash or lesion. MUSCULOSKELETAL: No joint pain or arthritis.  Positive for muscle pain. NEUROLOGIC: No tingling, numbness, weakness.  PSYCHIATRY: No anxiety. Occasional depression.   MEDICATIONS AT HOME:   Prior to Admission medications   Medication Sig Start Date End Date Taking? Authorizing Provider  amLODipine (NORVASC) 5 MG tablet TAKE 1 TABLET BY MOUTH DAILY 06/21/16  Yes [name], MD  buPROPion (WELLBUTRIN) 75 MG tablet Take 1 tablet (75 mg total) by mouth 2 (two) times daily. 02/21/16  Yes [name], MD  ELIQUIS 5 MG TABS tablet TAKE 1 TABLET BY MOUTH 2 TIMES DAILY 07/08/16  Yes [name], MD  eplerenone (INSPRA) 25 MG tablet Take 1 tablet (25 mg total) by mouth daily. 10/11/16  Yes [name], MD  furosemide (LASIX) 40 MG tablet Take 80 mg by mouth once. 10/09/16  Yes [provider]  insulin glargine (LANTUS) 100 unit/mL SOPN Inject 20 Units into the skin at bedtime. Currently using 15 units   Yes [provider]  lisinopril (PRINIVIL,ZESTRIL) 40 MG tablet TAKE 1 TABLET BY MOUTH DAILY 11/04/16  Yes [name], MD  metolazone (ZAROXOLYN) 2.5 MG tablet Take 2.5 mg by mouth daily. Take one tablet by mouth every Wednesday and Saturday as needed.   Yes [provider]  mycophenolate (MYFORTIC) 180 MG EC tablet Take 360 mg by mouth 2 (two) times daily.    Yes [provider]  potassium chloride (K-DUR,KLOR-CON) 10 MEQ tablet Take 20 mEq by mouth 2 (two) times daily.     Yes [provider]  rosuvastatin (CRESTOR) 10 MG tablet Take 1 tablet (10 mg total) by mouth daily. Patient taking differently: Take 10 mg by mouth daily. taking 02/02/16 11/11/16 Yes [name], MD  sulfamethoxazole-trimethoprim (BACTRIM,SEPTRA) 400-80 MG tablet Take 1 tablet by mouth 3 (three) times a week.   Yes [provider]  tacrolimus (PROGRAF) 0.5 MG capsule Take 1 mg by mouth 2 (two) times daily.   Yes [provider]  torsemide (DEMADEX) 20 MG tablet Take 1 tablet (20 mg total) by mouth 2 (two) times daily. 10/18/16 01/16/17 Yes [name], MD  beclomethasone (QVAR) 80 MCG/ACT inhaler Inhale 2 puffs into the lungs 2 (two) times daily. 05/10/16  [name], MD  CIALIS 20 MG tablet TAKE 1/2 TO 1 TABLET BY MOUTH EVERY OTHER DAY AS NEEDED 12/14/15   [name], MD  insulin aspart (NOVOLOG FLEXPEN) 100 UNIT/ML FlexPen Inject 5 Units into the skin 3 (three) times daily with meals as needed for high blood sugar. Pt uses as needed per sliding scale. Patient is taking 0-5 units tid    [provider]  mometasone (NASONEX) 50 MCG/ACT nasal spray PLACE 2 SPRAYS INTO THE NOSE AT BEDTIME. 05/27/16   [name], MD  Multiple Vitamin (MULTIVITAMIN WITH MINERALS) TABS tablet Take 1 tablet by mouth daily.    [provider]  omeprazole (PRILOSEC) 20 MG capsule TAKE 1 CAPSULE BY MOUTH ONCE DAILY 02/21/16   [name] I, MD      VITAL SIGNS:  Blood pressure (!) 170/77, pulse 65, temperature 98.1 F (36.7 C), resp. rate 17, height 5' 11" (1.803 m), SpO2 94 %.  PHYSICAL EXAMINATION:  GENERAL:  56 y.o.-year-old patient lying in the bed with no acute distress.  EYES: Pupils equal, round, reactive to light and accommodation. No scleral icterus. Extraocular muscles intact.  HEENT: Head atraumatic, normocephalic. Oropharynx and nasopharynx clear.  NECK:  Supple, no jugular venous distention. No thyroid enlargement, no  tenderness.  LUNGS: decreased breath sounds bilaterally, no wheezing, positive rales at the bases. No use of accessory muscles of respiration.  CARDIOVASCULAR: S1, S2 normal. No murmurs, rubs, or gallops.  ABDOMEN: Soft, nontender, nondistended. Bowel sounds present. No organomegaly or mass.  EXTREMITIES: 2+edema, no cyanosis, or clubbing.  NEUROLOGIC: Cranial nerves II through XII are intact. Muscle strength 5/5 in all extremities. Sensation intact. Gait not checked.  PSYCHIATRIC: The patient is alert and oriented x 3.  SKIN: No rash, lesion, or ulcer.   LABORATORY PANEL:   Laboratory data ordered by me  RADIOLOGY:  Chest x-ray ordered by me  EKG:   EKG ordered by me  IMPRESSION AND PLAN:   1. Acute mid range EF Congestive heart Failure with 22 pound weight gain. Start Lasix 40 mg IV twice a day. Daily weights. Strict ins and outs. 2. Atrial fibrillation rate controlled.hold Eliquis since cardiology is planning a cardiac catheterization. Start heparin drip tomorrow. 3. History of renal transplant continue immunosuppressive's. 4. Essential hypertension given extra dose of Norvasc this evening 5. Muscle pain and weakness. Hold Crestor and check a CPK 6. GERD on omeprazole 7. Type 2 diabetes mellitus. Sliding scale insulin and lower dose Lantus 15 units   All the records are reviewed and case discussed with ED provider. Management plans discussed with the patient, and he is in agreement.  CODE STATUS: full code  TOTAL TIME TAKING CARE OF THIS PATIENT: 50 minutes.    Loletha Grayer M.D on 11/11/2016 at 5:13 PM  Between 7am to 6pm - Pager - [phone]  After 6pm call admission pager [phone]  [facility]  [phone]  CC: Primary care physician; [name], MD

## 2016-11-11 NOTE — Progress Notes (Signed)
ANTICOAGULATION CONSULT NOTE - Initial Consult  Pharmacy Consult for heparin Indication: atrial fibrillation  Allergies  Allergen Reactions  . Morphine Itching    Patient Measurements: Height: 5' 11" (180.3 cm) Weight: 297 lb 4 oz (134.8 kg) IBW/kg (Calculated) : 75.3 Heparin Dosing Weight: 106 kg  Vital Signs: Temp: 98.2 F (36.8 C) (08/20 1913) Temp Source: Oral (08/20 1913) BP: 146/81 (08/20 1913) Pulse Rate: 66 (08/20 1913)  Labs:  Recent Labs  11/11/16 1649  HGB 11.1*  HCT 34.9*  PLT 155  APTT 44*  LABPROT 20.8*  INR 1.77  CREATININE 1.09  CKTOTAL 79    Estimated Creatinine Clearance: 104.8 mL/min (by C-G formula based on SCr of 1.09 mg/dL).   Medical History: Past Medical History:  Diagnosis Date  . Allergy    seasonal  . Amnestic MCI (mild cognitive impairment with memory loss)   . Asthma   . Atrial fibrillation ([place])   . Atrial fibrillation ([place]) 2016  . Benign neoplasm of colon   . CAD (coronary artery disease) 2005   had CABG  . CHF (congestive heart failure) ([place])   . Chronic venous stasis dermatitis of both lower extremities   . Cytomegaloviral disease ([place]) 2017  . Depression   . Diabetes mellitus   . Diverticulosis   . Dyspnea   . Erectile dysfunction   . ESRD (end stage renal disease) ([place]) 2005   Dialysis then renal transplant  . GERD (gastroesophageal reflux disease)   . Gout   . Hyperlipidemia    low since renal failure  . Hypertension   . Kidney transplant status, cadaveric 2012  . Obesity   . Pneumonia   . Purpura ([place])   . Sleep apnea    bipap with oxygen 2 l  . Trigger finger   . Ulcer 05/2016   Left shin  . Wears glasses     Assessment: Pharmacy consulted to dose and monitor heparin drip in this 56 year old male for atrial fibrillation.   Patient was taking apixaban for atrial fibrillation PTA and is now being converted to a heparin drip. Last recorded apixaban dose was 8/20 at 0830.  Consult initially entered  for heparin drip to begin tomorrow morning. EKG reads atrial fibrillation on admission. Per discussion with MD, will begin heparin drip tonight at least 12 hours post last apixaban dose with no bolus.  Baseline labs ordered. Baseline heparin level ordered as add-on.   Goal of Therapy:  Heparin level 0.3-0.7 units/ml aPTT 66-102 seconds Monitor platelets by anticoagulation protocol: Yes   Plan:  No bolus Begin heparin 1250 units/hr at 2130 this evening Will check HL and APTT 6 hours after infusion begins. If baseline HL is elevated, will need to dose off of APTT. CBC ordered with AM labs tomorrow.  Lenis Noon, PharmD Clinical Pharmacist 11/11/2016,7:39 PM

## 2016-11-12 DIAGNOSIS — I5033 Acute on chronic diastolic (congestive) heart failure: Secondary | ICD-10-CM

## 2016-11-12 DIAGNOSIS — I2510 Atherosclerotic heart disease of native coronary artery without angina pectoris: Secondary | ICD-10-CM

## 2016-11-12 DIAGNOSIS — N186 End stage renal disease: Secondary | ICD-10-CM

## 2016-11-12 DIAGNOSIS — Z940 Kidney transplant status: Secondary | ICD-10-CM

## 2016-11-12 DIAGNOSIS — I482 Chronic atrial fibrillation: Secondary | ICD-10-CM

## 2016-11-12 LAB — BASIC METABOLIC PANEL
ANION GAP: 5 (ref 5–15)
BUN: 22 mg/dL — ABNORMAL HIGH (ref 6–20)
CALCIUM: 8.8 mg/dL — AB (ref 8.9–10.3)
CO2: 29 mmol/L (ref 22–32)
Chloride: 109 mmol/L (ref 101–111)
Creatinine, Ser: 1.11 mg/dL (ref 0.61–1.24)
Glucose, Bld: 105 mg/dL — ABNORMAL HIGH (ref 65–99)
Potassium: 3.5 mmol/L (ref 3.5–5.1)
Sodium: 143 mmol/L (ref 135–145)

## 2016-11-12 LAB — CBC
HCT: 32.5 % — ABNORMAL LOW (ref 40.0–52.0)
HEMOGLOBIN: 10.5 g/dL — AB (ref 13.0–18.0)
MCH: 23.3 pg — ABNORMAL LOW (ref 26.0–34.0)
MCHC: 32.3 g/dL (ref 32.0–36.0)
MCV: 72.3 fL — ABNORMAL LOW (ref 80.0–100.0)
Platelets: 128 10*3/uL — ABNORMAL LOW (ref 150–440)
RBC: 4.51 MIL/uL (ref 4.40–5.90)
RDW: 17.8 % — ABNORMAL HIGH (ref 11.5–14.5)
WBC: 4.2 10*3/uL (ref 3.8–10.6)

## 2016-11-12 LAB — GLUCOSE, CAPILLARY
GLUCOSE-CAPILLARY: 97 mg/dL (ref 65–99)
Glucose-Capillary: 88 mg/dL (ref 65–99)
Glucose-Capillary: 143 mg/dL — ABNORMAL HIGH (ref 65–99)
Glucose-Capillary: 135 mg/dL — ABNORMAL HIGH (ref 65–99)

## 2016-11-12 LAB — APTT
APTT: 56 s — AB (ref 24–36)
aPTT: 55 seconds — ABNORMAL HIGH (ref 24–36)
aPTT: 61 seconds — ABNORMAL HIGH (ref 24–36)

## 2016-11-12 LAB — HIV ANTIBODY (ROUTINE TESTING W REFLEX): HIV SCREEN 4TH GENERATION: NONREACTIVE

## 2016-11-12 LAB — HEPARIN LEVEL (UNFRACTIONATED): HEPARIN UNFRACTIONATED: 1.18 [IU]/mL — AB (ref 0.30–0.70)

## 2016-11-12 MED ORDER — POTASSIUM CHLORIDE CRYS ER 10 MEQ PO TBCR
20.0000 meq | EXTENDED_RELEASE_TABLET | Freq: Two times a day (BID) | ORAL | Status: DC
  Administered 2016-11-13: 20 meq via ORAL
  Filled 2016-11-12: qty 1
  Filled 2016-11-12 (×2): qty 2
  Filled 2016-11-12: qty 1
  Filled 2016-11-12: qty 2
  Filled 2016-11-12: qty 1
  Filled 2016-11-12: qty 2
  Filled 2016-11-12: qty 1
  Filled 2016-11-12 (×2): qty 2
  Filled 2016-11-12: qty 1
  Filled 2016-11-12: qty 2

## 2016-11-12 MED ORDER — OXYCODONE-ACETAMINOPHEN 5-325 MG PO TABS
1.0000 | ORAL_TABLET | Freq: Four times a day (QID) | ORAL | Status: DC | PRN
  Administered 2016-11-12 (×2): 1 via ORAL
  Filled 2016-11-12 (×2): qty 1

## 2016-11-12 MED ORDER — ROSUVASTATIN CALCIUM 10 MG PO TABS
10.0000 mg | ORAL_TABLET | Freq: Every day | ORAL | Status: DC
Start: 2016-11-12 — End: 2016-11-16
  Administered 2016-11-12 – 2016-11-16 (×5): 10 mg via ORAL
  Filled 2016-11-12 (×5): qty 1

## 2016-11-12 MED ORDER — HEPARIN (PORCINE) IN NACL 100-0.45 UNIT/ML-% IJ SOLN
1850.0000 [IU]/h | INTRAMUSCULAR | Status: DC
  Administered 2016-11-12: 1650 [IU]/h via INTRAVENOUS
  Administered 2016-11-13: 1750 [IU]/h via INTRAVENOUS
  Filled 2016-11-12: qty 250

## 2016-11-12 MED ORDER — POTASSIUM CHLORIDE ER 10 MEQ PO CPCR: 20.0000 meq | ORAL_CAPSULE | Freq: Two times a day (BID) | ORAL | Status: DC

## 2016-11-12 MED ORDER — POTASSIUM CHLORIDE CRYS ER 10 MEQ PO TBCR: 20.0000 meq | EXTENDED_RELEASE_TABLET | Freq: Two times a day (BID) | ORAL | Status: DC

## 2016-11-12 NOTE — Consult Note (Signed)
Date: 11/12/2016                  Patient Name:  [name]  MRN: [id number]  DOB: 1960-08-07  Age / Sex: 56 y.o., male         PCP: [name], MD                 Service Requesting Consult: Hospitalist/ Fritzi Mandes, MD                 Reason for Consult: Anasarca            History of Present Illness: Patient is a 56 y.o. male  who was admitted to [facility] on 11/11/2016 for evaluation of worsening fluid overload. Patient has history of deceased donor renal transplant on 09/13/2010. Other medical problems include hypertension, diabetes, coronary disease, CABG, obesity, Gastric sleeve surgery 03/14/2014 and end-stage renal disease. Transplant history: Kidney show immunosuppression induction with Campath, now maintenance with tacrolimus and mycophenolate.  1 month allograft biopsy July 2012- recovering ATN with early tubular atrophy, interstitial fibrosis Repeat biopsy March 2017 for increasing proteinuria- no acute rejection, showed diabetic, neuropathy, FSGS  Hospitalization for CMV viremia in summer 2017. Treated with valganciclovir. Hospitalization for cellulitis on left shin again in 2017.  This hospitalization is for increasing fluid retention. Patient was seen by his transplant team as well as cardiology. Diuretics were changed from Lasix to oral torsemide. Also continued on eplerenone.  He has significant lower extremity edema and abdominal distention. He has been started on IV Lasix this admission.     Medications: Outpatient medications: Prescriptions Prior to Admission  Medication Sig Dispense Refill Last Dose  . amLODipine (NORVASC) 5 MG tablet TAKE 1 TABLET BY MOUTH DAILY 90 tablet 3 11/11/2016 at 0830  . beclomethasone (QVAR) 80 MCG/ACT inhaler Inhale 2 puffs into the lungs 2 (two) times daily. 1 Inhaler 6 prn at prn  . buPROPion (WELLBUTRIN) 75 MG tablet Take 1 tablet (75 mg total) by mouth 2 (two) times daily. 180 tablet 3 11/11/2016 at 0830  . ELIQUIS 5 MG  TABS tablet TAKE 1 TABLET BY MOUTH 2 TIMES DAILY 60 tablet 3 11/11/2016 at 0830  . eplerenone (INSPRA) 25 MG tablet Take 1 tablet (25 mg total) by mouth daily. 90 tablet 3 11/06/2016  . furosemide (LASIX) 40 MG tablet Take 80 mg by mouth once.  0 11/11/2016 at 0830  . insulin glargine (LANTUS) 100 unit/mL SOPN Inject 20 Units into the skin at bedtime. Currently using 15 units   11/10/2016 at 2200  . lisinopril (PRINIVIL,ZESTRIL) 40 MG tablet TAKE 1 TABLET BY MOUTH DAILY 30 tablet 3 11/11/2016 at 0830  . mometasone (NASONEX) 50 MCG/ACT nasal spray PLACE 2 SPRAYS INTO THE NOSE AT BEDTIME. 17 g 0 prn at prn  . Multiple Vitamin (MULTIVITAMIN WITH MINERALS) TABS tablet Take 1 tablet by mouth daily.   11/11/2016 at 0830  . mycophenolate (MYFORTIC) 180 MG EC tablet Take 360 mg by mouth 2 (two) times daily.    11/11/2016 at 0830  . omeprazole (PRILOSEC) 20 MG capsule TAKE 1 CAPSULE BY MOUTH ONCE DAILY 90 capsule 3 11/11/2016 at 0830  . potassium chloride (MICRO-K) 10 MEQ CR capsule Take 20 mEq by mouth 2 (two) times daily.   11/11/2016 at 0830  . rosuvastatin (CRESTOR) 10 MG tablet Take 1 tablet (10 mg total) by mouth daily. (Patient taking differently: Take 10 mg by mouth daily. taking) 90 tablet 3 11/11/2016 at 0830  .  sulfamethoxazole-trimethoprim (BACTRIM,SEPTRA) 400-80 MG tablet Take 1 tablet by mouth 3 (three) times a week.   11/11/2016 at 0830  . tacrolimus (PROGRAF) 0.5 MG capsule Take 1 mg by mouth 2 (two) times daily.   11/11/2016 at 0830  . CIALIS 20 MG tablet TAKE 1/2 TO 1 TABLET BY MOUTH EVERY OTHER DAY AS NEEDED (Patient not taking: Reported on 11/11/2016) 5 tablet 11 Not Taking at Unknown time  . insulin aspart (NOVOLOG FLEXPEN) 100 UNIT/ML FlexPen Inject 5 Units into the skin 3 (three) times daily with meals as needed for high blood sugar. Pt uses as needed per sliding scale. Patient is taking 0-5 units tid   Not Taking at Unknown time  . metolazone (ZAROXOLYN) 2.5 MG tablet Take 2.5 mg by mouth daily.  Take one tablet by mouth every Wednesday and Saturday as needed.   Not Taking at Unknown time  . torsemide (DEMADEX) 20 MG tablet Take 1 tablet (20 mg total) by mouth 2 (two) times daily. (Patient not taking: Reported on 11/11/2016) 60 tablet 3 Not Taking at Unknown time  . [DISCONTINUED] cephALEXin (KEFLEX) 500 MG capsule Take 1 capsule (500 mg total) by mouth 3 (three) times daily. (Patient not taking: Reported on 11/11/2016) 30 capsule 0 Completed Course at Unknown time    Current medications: Current Facility-Administered Medications  Medication Dose Route Frequency Provider Last Rate Last Dose  . acetaminophen (TYLENOL) tablet 650 mg  650 mg Oral Q6H PRN Loletha Grayer, MD       Or  . acetaminophen (TYLENOL) suppository 650 mg  650 mg Rectal Q6H PRN Wieting, Richard, MD      . amLODipine (NORVASC) tablet 5 mg  5 mg Oral Daily Loletha Grayer, MD   5 mg at 11/12/16 0933  . buPROPion [facility]) tablet 75 mg  75 mg Oral BID Loletha Grayer, MD   75 mg at 11/12/16 0934  . fluticasone (FLONASE) 50 MCG/ACT nasal spray 2 spray  2 spray Each Nare Daily PRN Wieting, Richard, MD      . furosemide (LASIX) injection 40 mg  40 mg Intravenous BID Loletha Grayer, MD   40 mg at 11/12/16 0932  . heparin ADULT infusion 100 units/mL (25000 units/247m sodium chloride 0.45%)  1,350 Units/hr Intravenous Continuous WLoletha Grayer MD 13.5 mL/hr at 11/12/16 0544 1,350 Units/hr at 11/12/16 0544  . insulin aspart (novoLOG) injection 0-5 Units  0-5 Units Subcutaneous QHS Wieting, Richard, MD      . insulin aspart (novoLOG) injection 0-9 Units  0-9 Units Subcutaneous TID WC Wieting, Richard, MD      . insulin glargine (LANTUS) injection 15 Units  15 Units Subcutaneous QHS WLoletha Grayer MD   15 Units at 11/11/16 2154  . lisinopril (PRINIVIL,ZESTRIL) tablet 40 mg  40 mg Oral Daily WLoletha Grayer MD   40 mg at 11/12/16 0933  . multivitamin with minerals tablet 1 tablet  1 tablet Oral Daily WLoletha Grayer MD   1 tablet at 11/12/16 0933  . mycophenolate (MYFORTIC) EC tablet 360 mg  360 mg Oral BID WLoletha Grayer MD   360 mg at 11/12/16 0934  . ondansetron (ZOFRAN) tablet 4 mg  4 mg Oral Q6H PRN Wieting, Richard, MD       Or  . ondansetron (ZOFRAN) injection 4 mg  4 mg Intravenous Q6H PRN Wieting, Richard, MD      . oxyCODONE-acetaminophen (PERCOCET/ROXICET) 5-325 MG per tablet 1-2 tablet  1-2 tablet Oral Q6H PRN PSaundra Shelling MD   1 tablet at  11/12/16 0935  . pantoprazole (PROTONIX) EC tablet 40 mg  40 mg Oral Daily Loletha Grayer, MD   40 mg at 11/12/16 0933  . potassium chloride (K-DUR,KLOR-CON) CR tablet 20 mEq  20 mEq Oral BID Fritzi Mandes, MD      . sodium chloride flush (NS) 0.9 % injection 3 mL  3 mL Intravenous Q12H Wieting, Richard, MD   3 mL at 11/12/16 0935  . spironolactone (ALDACTONE) tablet 25 mg  25 mg Oral Daily Wieting, Richard, MD      . sulfamethoxazole-trimethoprim (BACTRIM,SEPTRA) 400-80 MG per tablet 1 tablet  1 tablet Oral Once per day on Mon Wed Fri Loletha Grayer, MD   1 tablet at 11/11/16 1826  . tacrolimus (PROGRAF) capsule 1 mg  1 mg Oral BID Loletha Grayer, MD   1 mg at 11/12/16 8299      Allergies: Allergies  Allergen Reactions  . Morphine Itching      Past Medical History: Past Medical History:  Diagnosis Date  . Allergy    seasonal  . Amnestic MCI (mild cognitive impairment with memory loss)   . Asthma   . Atrial fibrillation ([place])   . Atrial fibrillation ([place]) 2016  . Benign neoplasm of colon   . CAD (coronary artery disease) 2005   had CABG  . CHF (congestive heart failure) ([place])   . Chronic venous stasis dermatitis of both lower extremities   . Cytomegaloviral disease ([place]) 2017  . Depression   . Diabetes mellitus   . Diverticulosis   . Dyspnea   . Erectile dysfunction   . ESRD (end stage renal disease) ([place]) 2005   Dialysis then renal transplant  . GERD (gastroesophageal reflux disease)   . Gout   . Hyperlipidemia     low since renal failure  . Hypertension   . Kidney transplant status, cadaveric 2012  . Obesity   . Pneumonia   . Purpura ([place])   . Sleep apnea    bipap with oxygen 2 l  . Trigger finger   . Ulcer 05/2016   Left shin  . Wears glasses      Past Surgical History: Past Surgical History:  Procedure Laterality Date  . BARIATRIC SURGERY    . CARDIAC CATHETERIZATION     [facility]  . COLONOSCOPY WITH PROPOFOL N/A 04/22/2013   Procedure: COLONOSCOPY WITH PROPOFOL;  Surgeon: [name], MD;  Location: [facility];  Service: Endoscopy;  Laterality: N/A;  . CORONARY ARTERY BYPASS GRAFT  2005   X 4  . DG ANGIO AV SHUNT*L*     right and left upper arms  . FASCIOTOMY  03/03/2012   Procedure: FASCIOTOMY;  Surgeon: [name], MD;  Location: [facility];  Service: Orthopedics;  Laterality: Right;  FASCIOTOMY RIGHT SMALL FINGER  . FASCIOTOMY Left 08/17/2013   Procedure: FASCIOTOMY LEFT RING;  Surgeon: [name], MD;  Location: [facility];  Service: Orthopedics;  Laterality: Left;  . INCISION AND DRAINAGE ABSCESS Left 10/15/2015   Procedure: INCISION AND DRAINAGE ABSCESS;  Surgeon: [name], MD;  Location: [facility];  Service: General;  Laterality: Left;  . KIDNEY TRANSPLANT  09/13/2010   cadaver--at [place]  1/12   left  . VASECTOMY       Family History: Family History  Problem Relation Age of Onset  . Heart disease Father   . Kidney failure Father   . Kidney disease Father   . Diabetes Maternal  Grandmother   . Breast cancer Maternal Grandmother   . Valvular heart disease Mother   . Liver cancer Paternal Uncle   . Liver cancer Paternal Grandmother   . Prostate cancer Neg Hx      Social History: Social History   Social History  . Marital status: Married    Spouse name: N/A  . Number of children: 2  . Years of education: N/A   Occupational History  . 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 Smoking status: Former Smoker    Types: Cigars    Quit date: 09/02/1994  . Smokeless tobacco: Never Used  . Alcohol use 0.0 - 0.6 oz/week     Comment: occasional- 3 in the past year  . Drug use: No  . Sexual activity: Not on file   Other Topics Concern  . Not on file   Social History Narrative   In school for culinary arts--- will finish 12/16      Has living will   Wife is health care POA   Would accept resuscitation attempts   Hasn't considered tube feedings     Review of Systems: Gen: No fevers or chills HEENT: No vision or hearing problems CV: Shortness of breath with exertion Resp: No cough or sputum GI: Appetite is good GU : No problems reported with voiding. No blood in the urine MS: No acute complaints Derm:  No complaints Psych: No complaints Heme: No complaints Neuro: No complaints Endocrine. No complaints  Vital Signs: Blood pressure (!) 147/73, pulse 68, temperature 97.9 F (36.6 C), temperature source Oral, resp. rate 18, height _0  (1.803 m), weight (!) 136.5 kg (300 lb 14.4 oz), SpO2 93 %.   Intake/Output Summary (Last 24 hours) at 11/12/16 1124 Last data filed at 11/12/16 0928  Gross per 24 hour  Intake           564.17 ml  Output              900 ml  Net          -335.83 ml    Weight trends: Filed Weights   11/11/16 1700 11/12/16 0402  Weight: 134.8 kg (297 lb 4 oz) (!) 136.5 kg (300 lb 14.4 oz)    Physical Exam: General:  No acute distress, sitting up in the chair   HEENT Anicteric, moist oral mucous membranes   Neck:  Supple, no masses   Lungs: Mild diffuse wheezing, mild basilar crackles   Heart::  Irregular, no rub   Abdomen: Soft, distended   Extremities:  2-3+ pitting edema   Neurologic: Alert, oriented   Skin: No acute rashes              Lab results: Basic Metabolic Panel:  Recent Labs Lab 11/11/16 1649 11/12/16 0417  NA 141 143  K 3.7 3.5  CL 105 109  CO2 29 29   GLUCOSE 91 105*  BUN 21* 22*  CREATININE 1.09 1.11  CALCIUM 9.2 8.8*    Liver Function Tests:  Recent Labs Lab 11/11/16 1649  AST 27  ALT 13*  ALKPHOS 125  BILITOT 1.2  PROT 8.2*  ALBUMIN 3.8   No results for input(s): LIPASE, AMYLASE in the last 168 hours. No results for input(s): AMMONIA in the last 168 hours.  CBC:  Recent Labs Lab 11/11/16 1649 11/12/16 0417  WBC 4.9 4.2  HGB 11.1* 10.5*  HCT 34.9* 32.5*  MCV 72.0* 72.3*  PLT 155 128*    Cardiac Enzymes:  Recent Labs Lab 11/11/16 1649  CKTOTAL 79    BNP: Invalid input(s): POCBNP  CBG:  Recent Labs Lab 11/11/16 2023 11/12/16 0708  GLUCAP 142* 88    Microbiology: No results found for this or any previous visit (from the past 720 hour(s)).   Coagulation Studies:  Recent Labs  11/11/16 1649  LABPROT 20.8*  INR 1.77    Urinalysis:  Recent Labs  11/11/16 2107  COLORURINE YELLOW*  LABSPEC 1.009  PHURINE 5.0  GLUCOSEU NEGATIVE  HGBUR SMALL*  BILIRUBINUR NEGATIVE  KETONESUR NEGATIVE  PROTEINUR NEGATIVE  NITRITE NEGATIVE  LEUKOCYTESUR NEGATIVE        Imaging: Dg Chest 1 View  Result Date: 11/11/2016 CLINICAL DATA:  Admitted for fluid overload.  Shortness of breath. EXAM: CHEST 1 VIEW COMPARISON:  September 01, 2015 FINDINGS: A calcified lymph node is seen in the mediastinum, unchanged. Cardiomegaly. The hila and mediastinum are unremarkable. No overt edema. Pulmonary venous congestion identified. No other acute abnormalities. IMPRESSION: Cardiomegaly.  Mild pulmonary venous congestion without overt edema. Electronically Signed   By: Dorise Bullion III M.D   On: 11/11/2016 18:47      Assessment & Plan: Pt is a 56 y.o.   male with Diabetes, hypertension, Coronary disease, CABG, deceased renal transplant, was admitted on 11/11/2016 with worsening fluid overload.   1. Anasarca, worsening fluid overload Agree with IV Lasix twice a day. Agree with continuing Aldactone - Discussed  low-salt diet with patient - Daily weights - Assess response of current therapy - Urinalysis is negative for protein.  2. Renal transplant in June 2012 - Continue home dose of immunosuppression Currently mycophenolate 360 mg twice a day Tacrolimus 1 mg twice a day

## 2016-11-12 NOTE — Consult Note (Signed)
Cardiology Consultation Note  Patient ID: [name], MRN: [id number], DOB/AGE: 57-Jul-1962 57 y.o. Admit date: 11/11/2016   Date of Consult: 11/12/2016 Primary Physician: [name], MD Primary Cardiologist: [name], MD Requesting Physician: Dr. Leslye Peer, MD  Chief Complaint: SOB/LE swelling Reason for Consult: Acute on chronic systolic CHF  HPI: [name] is a 56 y.o. male who is being seen today for the evaluation of acute on chronic systolic CHF at the request of Dr. Leslye Peer, MD. Patient has a h/o CAD s/p CABG in 2004 with no recurrent ischemic events since, chronic systolic CHF/ICM with prior EF of 35-40% now improved to 45% by TTE in 09/2016, chronic Afib on Eliquis with prior significant bradycardia on small dose Coreg leading to discontinuation, ESRD previously on HD from 2004-2012 now s/p kidney transplant, anemia of chronic disease, DM2, HTN, HLD, obesity s/p gastric bypass in 05/2990 without complications, and OSA on CPAP who was directly admitted from our office on 8/20 with volume overload.   He had significant worsening of right-sided heart failure over the last few months with significant abdominal swelling, leg edema and weight gain. Repeat echocardiogram in 09/2016, that was a difficult study overall showed an EF of 42%, diastolic function could not be determined and pulmonary pressure could not be calculated accurately. Eplerenone was added and he was switched furosemide to torsemide 20 mg twice daily. He had no significant improvement. He was seen by his transplant team recently and was switched back to furosemide 80 mg once daily. In spite of that, he continued to go in weight with significant swelling in his abdomen. In the office on 8/20, he was more out of breath with severe orthopnea, having hard time sleeping at night. Never with chest pain. He was noted to be at least 22 pounds volume overloaded. Because he had failed outpatient diuresis with both Lasix and  torsemide, it was felt he would benefit from inpatient IV diuresis and he was directly admitted and placed on IV Lasix 40 mg bid with documented UOP of 800 mL for the past 24 hours, though patient reports he has voided more. Goal dry weight is currently at 275 pounds (patient's current admission weight is 300 pounds). Anemia of chronic disease has remained stable. Renal function has remained stable with diuresis. His Eliquis was stopped and he was placed on a heparin gtt in preparation for possible RHC prior to discharge. CXR showed vascular congestion. EKG as below. Currently, he feels like his breathing is improving.    Past Medical History:  Diagnosis Date  . Allergy    seasonal  . Amnestic MCI (mild cognitive impairment with memory loss)   . Asthma   . Atrial fibrillation ([place])   . Atrial fibrillation ([place]) 2016  . Benign neoplasm of colon   . CAD (coronary artery disease) 2005   had CABG  . CHF (congestive heart failure) ([place])   . Chronic venous stasis dermatitis of both lower extremities   . Cytomegaloviral disease ([place]) 2017  . Depression   . Diabetes mellitus   . Diverticulosis   . Dyspnea   . Erectile dysfunction   . ESRD (end stage renal disease) ([place]) 2005   Dialysis then renal transplant  . GERD (gastroesophageal reflux disease)   . Gout   . Hyperlipidemia    low since renal failure  . Hypertension   . Kidney transplant status, cadaveric 2012  . Obesity   . Pneumonia   . Purpura ([place])   .  Sleep apnea    bipap with oxygen 2 l  . Trigger finger   . Ulcer 05/2016   Left shin  . Wears glasses       Most Recent Cardiac Studies: As above   Surgical History:  Past Surgical History:  Procedure Laterality Date  . BARIATRIC SURGERY    . CARDIAC CATHETERIZATION     [facility]  . COLONOSCOPY WITH PROPOFOL N/A 04/22/2013   Procedure: COLONOSCOPY WITH PROPOFOL;  Surgeon: [name], MD;  Location: [facility];  Service: Endoscopy;  Laterality: N/A;  . CORONARY ARTERY  BYPASS GRAFT  2005   X 4  . DG ANGIO AV SHUNT*L*     right and left upper arms  . FASCIOTOMY  03/03/2012   Procedure: FASCIOTOMY;  Surgeon: [name], MD;  Location: [facility];  Service: Orthopedics;  Laterality: Right;  FASCIOTOMY RIGHT SMALL FINGER  . FASCIOTOMY Left 08/17/2013   Procedure: FASCIOTOMY LEFT RING;  Surgeon: [name], MD;  Location: [facility];  Service: Orthopedics;  Laterality: Left;  . INCISION AND DRAINAGE ABSCESS Left 10/15/2015   Procedure: INCISION AND DRAINAGE ABSCESS;  Surgeon: [name], MD;  Location: [facility];  Service: General;  Laterality: Left;  . KIDNEY TRANSPLANT  09/13/2010   cadaver--at [place]  1/12   left  . VASECTOMY       Home Meds: Prior to Admission medications   Medication Sig Start Date End Date Taking? Authorizing Provider  amLODipine (NORVASC) 5 MG tablet TAKE 1 TABLET BY MOUTH DAILY 06/21/16  Yes [name], MD  beclomethasone (QVAR) 80 MCG/ACT inhaler Inhale 2 puffs into the lungs 2 (two) times daily. 05/10/16  Yes [name], MD  buPROPion (WELLBUTRIN) 75 MG tablet Take 1 tablet (75 mg total) by mouth 2 (two) times daily. 02/21/16  Yes [name], MD  ELIQUIS 5 MG TABS tablet TAKE 1 TABLET BY MOUTH 2 TIMES DAILY 07/08/16  Yes [name], MD  eplerenone (INSPRA) 25 MG tablet Take 1 tablet (25 mg total) by mouth daily. 10/11/16  Yes [name], MD  furosemide (LASIX) 40 MG tablet Take 80 mg by mouth once. 10/09/16  Yes [provider]  insulin glargine (LANTUS) 100 unit/mL SOPN Inject 20 Units into the skin at bedtime. Currently using 15 units   Yes [provider]  lisinopril (PRINIVIL,ZESTRIL) 40 MG tablet TAKE 1 TABLET BY MOUTH DAILY 11/04/16  Yes [name], MD  mometasone (NASONEX) 50 MCG/ACT nasal spray PLACE 2 SPRAYS INTO THE NOSE AT BEDTIME. 05/27/16  Yes [name], MD  Multiple Vitamin (MULTIVITAMIN WITH  MINERALS) TABS tablet Take 1 tablet by mouth daily.   Yes [provider]  mycophenolate (MYFORTIC) 180 MG EC tablet Take 360 mg by mouth 2 (two) times daily.    Yes [provider]  omeprazole (PRILOSEC) 20 MG capsule TAKE 1 CAPSULE BY MOUTH ONCE DAILY 02/21/16  Yes [name] I, MD  potassium chloride (MICRO-K) 10 MEQ CR capsule Take 20 mEq by mouth 2 (two) times daily.   Yes [provider]  rosuvastatin (CRESTOR) 10 MG tablet Take 1 tablet (10 mg total) by mouth daily. Patient taking differently: Take 10 mg by mouth daily. taking 02/02/16 11/11/16 Yes [name], MD  sulfamethoxazole-trimethoprim (BACTRIM,SEPTRA) 400-80 MG tablet Take 1 tablet by mouth 3 (three) times a week.   Yes [provider]  tacrolimus (PROGRAF) 0.5  MG capsule Take 1 mg by mouth 2 (two) times daily.   Yes [provider]  CIALIS 20 MG tablet TAKE 1/2 TO 1 TABLET BY MOUTH EVERY OTHER DAY AS NEEDED Patient not taking: Reported on 11/11/2016 12/14/15   [name], MD  insulin aspart (NOVOLOG FLEXPEN) 100 UNIT/ML FlexPen Inject 5 Units into the skin 3 (three) times daily with meals as needed for high blood sugar. Pt uses as needed per sliding scale. Patient is taking 0-5 units tid    [provider]  metolazone (ZAROXOLYN) 2.5 MG tablet Take 2.5 mg by mouth daily. Take one tablet by mouth every Wednesday and Saturday as needed.    [provider]  torsemide (DEMADEX) 20 MG tablet Take 1 tablet (20 mg total) by mouth 2 (two) times daily. Patient not taking: Reported on 11/11/2016 10/18/16 01/16/17  [name], MD    Inpatient Medications:  . amLODipine  5 mg Oral Daily  . buPROPion  75 mg Oral BID  . furosemide  40 mg Intravenous BID  . insulin aspart  0-5 Units Subcutaneous QHS  . insulin aspart  0-9 Units Subcutaneous TID WC  . insulin glargine  15 Units Subcutaneous QHS  . lisinopril  40 mg Oral Daily  . multivitamin with minerals  1  tablet Oral Daily  . mycophenolate  360 mg Oral BID  . pantoprazole  40 mg Oral Daily  . potassium chloride  20 mEq Oral BID  . sodium chloride flush  3 mL Intravenous Q12H  . spironolactone  25 mg Oral Daily  . sulfamethoxazole-trimethoprim  1 tablet Oral Once per day on Mon Wed Fri  . tacrolimus  1 mg Oral BID   . heparin 1,350 Units/hr (11/12/16 0544)    Allergies:  Allergies  Allergen Reactions  . Morphine Itching    Social History   Social History  . Marital status: Married    Spouse name: N/A  . Number of children: 2  . Years of education: N/A   Occupational History  . 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 Smoking status: Former Smoker    Types: Cigars    Quit date: 09/02/1994  . Smokeless tobacco: Never Used  . Alcohol use 0.0 - 0.6 oz/week     Comment: occasional- 3 in the past year  . Drug use: No  . Sexual activity: Not on file   Other Topics Concern  . Not on file   Social History Narrative   In school for culinary arts--- will finish 12/16      Has living will   Wife is health care POA   Would accept resuscitation attempts   Hasn't considered tube feedings     Family History  Problem Relation Age of Onset  . Heart disease Father   . Kidney failure Father   . Kidney disease Father   . Diabetes Maternal Grandmother   . Breast cancer Maternal Grandmother   . Valvular heart disease Mother   . Liver cancer Paternal Uncle   . Liver cancer Paternal Grandmother   . Prostate cancer Neg Hx      Review of Systems: Review of Systems  Constitutional: Positive for malaise/fatigue. Negative for chills, diaphoresis, fever and weight loss.  HENT: Negative for congestion.   Eyes: Negative for discharge and redness.  Respiratory: Positive for cough. Negative for hemoptysis, sputum production, shortness of breath and wheezing.   Cardiovascular:  Positive for orthopnea, leg swelling and PND.  Negative for chest pain, palpitations and claudication.  Gastrointestinal: Negative for abdominal pain, blood in stool, heartburn, melena, nausea and vomiting.  Genitourinary: Negative for hematuria.  Musculoskeletal: Negative for falls and myalgias.  Skin: Negative for rash.  Neurological: Positive for weakness. Negative for dizziness, tingling, tremors, sensory change, speech change, focal weakness and loss of consciousness.  Endo/Heme/Allergies: Does not bruise/bleed easily.  Psychiatric/Behavioral: Negative for substance abuse. The patient is not nervous/anxious.   All other systems reviewed and are negative.   Labs:  Recent Labs  11/11/16 1649  CKTOTAL 79   Lab Results  Component Value Date   WBC 4.2 11/12/2016   HGB 10.5 (L) 11/12/2016   HCT 32.5 (L) 11/12/2016   MCV 72.3 (L) 11/12/2016   PLT 128 (L) 11/12/2016     Recent Labs Lab 11/11/16 1649 11/12/16 0417  NA 141 143  K 3.7 3.5  CL 105 109  CO2 29 29  BUN 21* 22*  CREATININE 1.09 1.11  CALCIUM 9.2 8.8*  PROT 8.2*  --   BILITOT 1.2  --   ALKPHOS 125  --   ALT 13*  --   AST 27  --   GLUCOSE 91 105*   Lab Results  Component Value Date   CHOL 92 03/06/2016   HDL 34 (L) 03/06/2016   LDLCALC 43 03/06/2016   TRIG 73 03/06/2016   No results found for: DDIMER  Radiology/Studies:  Dg Chest 1 View  Result Date: 11/11/2016 CLINICAL DATA:  Admitted for fluid overload.  Shortness of breath. EXAM: CHEST 1 VIEW COMPARISON:  September 01, 2015 FINDINGS: A calcified lymph node is seen in the mediastinum, unchanged. Cardiomegaly. The hila and mediastinum are unremarkable. No overt edema. Pulmonary venous congestion identified. No other acute abnormalities. IMPRESSION: Cardiomegaly.  Mild pulmonary venous congestion without overt edema. Electronically Signed   By: Dorise Bullion III M.D   On: 11/11/2016 18:47    EKG: Interpreted by me showed: Afib, 67 bpm, nonspecific IVCD, poor R wave progression Telemetry: Interpreted  by me showed: Afib with bradycardic rates into the 40s bpm overnight   Weights: Filed Weights   11/11/16 1700 11/12/16 0402  Weight: 297 lb 4 oz (134.8 kg) (!) 300 lb 14.4 oz (136.5 kg)     Physical Exam: Blood pressure (!) 147/73, pulse 68, temperature 97.9 F (36.6 C), temperature source Oral, resp. rate 18, height _0  (1.803 m), weight (!) 300 lb 14.4 oz (136.5 kg), SpO2 93 %. Body mass index is 41.97 kg/m. General: Well developed, well nourished, in no acute distress. Head: Normocephalic, atraumatic, sclera non-icteric, no xanthomas, nares are without discharge.  Neck: Negative for carotid bruits. JVD elevated to the angle of the mandible. Lungs: Bibasilar crackles. Breathing is unlabored. Heart: Irregularly irregular, mildly bradycardic with S1 S2. No murmurs, rubs, or gallops appreciated. Abdomen: Soft, non-tender, distended with normoactive bowel sounds. No hepatomegaly. No rebound/guarding. No obvious abdominal masses. Msk:  Strength and tone appear normal for age. Extremities: No clubbing or cyanosis. 1+ bilateral pitting edema. Distal pedal pulses are 2+ and equal bilaterally. Neuro: Alert and oriented X 3. No facial asymmetry. No focal deficit. Moves all extremities spontaneously. Psych:  Responds to questions appropriately with a normal affect.    Assessment and Plan:  Principal Problem:   Acute on chronic systolic CHF (congestive heart failure) (HCC) Active Problems:   CAD (coronary artery disease)   Hypertension   Hyperlipidemia   ESRD (end stage renal  disease) ([place])   Type 2 diabetes, controlled, with renal manifestation ([place])   Chronic atrial fibrillation ([place])   H/O kidney transplant   Chronic venous insufficiency    1. Acute on chronic systolic CHF with RV failure: -He continues to appear volume overloaded, dry weight ~ 275 pounds -Continue with IV diuresis, consider increasing Lasix to 60 mg bid -Continue KCl repletion -No indication to repeat echo  given recent study from 09/2016 -After he is adequately diuresed, plan for RHC prior to discharge  -Not on beta blocker given prior issue with significant bradycardia with Coreg -Continue lisinopril and spironolactone given his cardiomyopathy -Daily weights, strict Is and Os  2. Chronic Afib: -Rates currently bradycardic, asymptomatic  -Not on beta blocker given bradycardia -Eliquis on hold given plans for RHC prior to discharge -Heparin gtt -Less likely his Afib is playing a role in his CHF  3. CAD: -Without angina -Continue current medical therapy  4. ESRD s/p renal transplant: -Nephrology on board  5. HLD: -Statin  Signed, Christell Faith, PA-C [facility] Pager: [phone] 11/12/2016, 9:54 AM

## 2016-11-12 NOTE — Plan of Care (Signed)
Problem: Fluid Volume: Goal: Ability to maintain a balanced intake and output will improve Outcome: Progressing Remains edematous but continues to diurese without difficulty.

## 2016-11-12 NOTE — Progress Notes (Signed)
Pt alert and oriented x4, no complaints of pain or discomfort.  Bed in low position, call bell within reach.  Bed alarms on and functioning.  Assessment done and charted.  Will continue to monitor and do hourly rounding throughout the shift

## 2016-11-12 NOTE — Progress Notes (Signed)
ANTICOAGULATION CONSULT NOTE - Initial Consult  Pharmacy Consult for heparin Indication: atrial fibrillation  Allergies  Allergen Reactions  . Morphine Itching    Patient Measurements: Height: 5' 11" (180.3 cm) Weight: (!) 300 lb 14.4 oz (136.5 kg) IBW/kg (Calculated) : 75.3 Heparin Dosing Weight: 106 kg  Vital Signs: Temp: 98.2 F (36.8 C) (08/21 1140) Temp Source: Oral (08/21 0826) BP: 150/79 (08/21 1140) Pulse Rate: 56 (08/21 1140)  Labs:  Recent Labs  11/11/16 1649 11/11/16 1943 11/12/16 0417 11/12/16 1207  HGB 11.1*  --  10.5*  --   HCT 34.9*  --  32.5*  --   PLT 155  --  128*  --   APTT 44*  --  56* 55*  LABPROT 20.8*  --   --   --   INR 1.77  --   --   --   HEPARINUNFRC  --  1.64* 1.18*  --   CREATININE 1.09  --  1.11  --   CKTOTAL 79  --   --   --     Estimated Creatinine Clearance: 103.6 mL/min (by C-G formula based on SCr of 1.11 mg/dL).   Medical History: Past Medical History:  Diagnosis Date  . Allergy    seasonal  . Amnestic MCI (mild cognitive impairment with memory loss)   . Asthma   . Atrial fibrillation ([place])   . Atrial fibrillation ([place]) 2016  . Benign neoplasm of colon   . CAD (coronary artery disease) 2005   had CABG  . CHF (congestive heart failure) ([place])   . Chronic venous stasis dermatitis of both lower extremities   . Cytomegaloviral disease ([place]) 2017  . Depression   . Diabetes mellitus   . Diverticulosis   . Dyspnea   . Erectile dysfunction   . ESRD (end stage renal disease) ([place]) 2005   Dialysis then renal transplant  . GERD (gastroesophageal reflux disease)   . Gout   . Hyperlipidemia    low since renal failure  . Hypertension   . Kidney transplant status, cadaveric 2012  . Obesity   . Pneumonia   . Purpura ([place])   . Sleep apnea    bipap with oxygen 2 l  . Trigger finger   . Ulcer 05/2016   Left shin  . Wears glasses     Assessment: Pharmacy consulted to dose and monitor heparin drip in this 56 year old  male for atrial fibrillation.   Patient was taking apixaban for atrial fibrillation PTA and is now being converted to a heparin drip. Last recorded apixaban dose was 8/20 at 0830.  Consult initially entered for heparin drip to begin tomorrow morning. EKG reads atrial fibrillation on admission. Per discussion with MD, will begin heparin drip tonight at least 12 hours post last apixaban dose with no bolus.  Baseline labs ordered. Baseline heparin level ordered as add-on.   Goal of Therapy:  Heparin level 0.3-0.7 units/ml aPTT 66-102 seconds Monitor platelets by anticoagulation protocol: Yes   Plan:  APTT resulted at 55 Will increase rate to 1500 units/hr and recheck in 6 hours  Ramond Dial, PharmD Clinical Pharmacist 11/12/2016,1:00 PM

## 2016-11-12 NOTE — Progress Notes (Addendum)
ANTICOAGULATION CONSULT NOTE - Initial Consult  Pharmacy Consult for heparin Indication: atrial fibrillation  Allergies  Allergen Reactions  . Morphine Itching    Patient Measurements: Height: _0  (180.3 cm) Weight: (!) 300 lb 14.4 oz (136.5 kg) IBW/kg (Calculated) : 75.3 Heparin Dosing Weight: 106 kg  Vital Signs: Temp: 98.2 F (36.8 C) (08/21 1140) Temp Source: Oral (08/21 0826) BP: 146/57 (08/21 1732) Pulse Rate: 56 (08/21 1140)  Labs:  Recent Labs  11/11/16 1649 11/11/16 1943 11/12/16 0417 11/12/16 1207 11/12/16 1853  HGB 11.1*  --  10.5*  --   --   HCT 34.9*  --  32.5*  --   --   PLT 155  --  128*  --   --   APTT 44*  --  56* 55* 61*  LABPROT 20.8*  --   --   --   --   INR 1.77  --   --   --   --   HEPARINUNFRC  --  1.64* 1.18*  --   --   CREATININE 1.09  --  1.11  --   --   CKTOTAL 79  --   --   --   --     Estimated Creatinine Clearance: 103.6 mL/min (by C-G formula based on SCr of 1.11 mg/dL).   Medical History: Past Medical History:  Diagnosis Date  . Allergy    seasonal  . Amnestic MCI (mild cognitive impairment with memory loss)   . Asthma   . Atrial fibrillation ([place])   . Atrial fibrillation ([place]) 2016  . Benign neoplasm of colon   . CAD (coronary artery disease) 2005   had CABG  . CHF (congestive heart failure) ([place])   . Chronic venous stasis dermatitis of both lower extremities   . Cytomegaloviral disease ([place]) 2017  . Depression   . Diabetes mellitus   . Diverticulosis   . Dyspnea   . Erectile dysfunction   . ESRD (end stage renal disease) ([place]) 2005   Dialysis then renal transplant  . GERD (gastroesophageal reflux disease)   . Gout   . Hyperlipidemia    low since renal failure  . Hypertension   . Kidney transplant status, cadaveric 2012  . Obesity   . Pneumonia   . Purpura ([place])   . Sleep apnea    bipap with oxygen 2 l  . Trigger finger   . Ulcer 05/2016   Left shin  . Wears glasses     Assessment: Pharmacy  consulted to dose and monitor heparin drip in this 56 year old male for atrial fibrillation. Baseline labs ordered.  Patient was taking apixaban for atrial fibrillation PTA and is now being converted to a heparin drip. Last recorded apixaban dose was 8/20 at 0830.  Consult initially entered for heparin drip to begin tomorrow morning. EKG reads atrial fibrillation on admission. Per discussion with MD, will begin heparin drip tonight at least 12 hours post last apixaban dose with no bolus.  Goal of Therapy:  Heparin level 0.3-0.7 units/ml aPTT 66-102 seconds Monitor platelets by anticoagulation protocol: Yes   Plan:  APTT = 61 seconds (below goal of 66-102 seconds). Will increase heparin infusion to 1650 units/hr and check APTT and HL in 6 hours. Once APTT and HL correlate, can monitor using HL. CBC ordered with AM labs tomorrow.  Lenis Noon, PharmD, BCPS Clinical Pharmacist 11/12/2016,7:49 PM    8/22 0300 aPTT 64, heparin level 0.54. Increase rate to 1750 units/hr and  recheck aPTT in 6 hours.  Sim Boast, PharmD, BCPS  11/13/16 4:02 AM

## 2016-11-12 NOTE — Plan of Care (Signed)
Problem: Physical Regulation: Goal: Ability to maintain clinical measurements within normal limits will improve Outcome: Not Progressing Afib with slow Ventricular rates while sleeping (40-50s).  Pt asleep with CPAP in place, arouses easily without complaints.

## 2016-11-12 NOTE — Progress Notes (Signed)
Report from [facility]. Patient sitting in chair, working on laptop. No distress or complaints. Will continue to monitor.

## 2016-11-12 NOTE — Progress Notes (Signed)
[name] [place] NAME: [name]    MR#:  [id number]  DATE OF BIRTH:  1961/02/07  SUBJECTIVE:  Came in with increasing shortness of breath and weight gain found to have CHF  REVIEW OF SYSTEMS:   Review of Systems  Constitutional: Negative for chills, fever and weight loss.  HENT: Negative for ear discharge, ear pain and nosebleeds.   Eyes: Negative for blurred vision, pain and discharge.  Respiratory: Positive for shortness of breath. Negative for sputum production, wheezing and stridor.   Cardiovascular: Positive for orthopnea and leg swelling. Negative for chest pain, palpitations and PND.  Gastrointestinal: Negative for abdominal pain, diarrhea, nausea and vomiting.  Genitourinary: Negative for frequency and urgency.  Musculoskeletal: Negative for back pain and joint pain.  Neurological: Positive for weakness. Negative for sensory change, speech change and focal weakness.  Psychiatric/Behavioral: Negative for depression and hallucinations. The patient is not nervous/anxious.    Tolerating Diet:yes Tolerating PT: not needed  DRUG ALLERGIES:   Allergies  Allergen Reactions  . Morphine Itching    VITALS:  Blood pressure (!) 150/79, pulse (!) 56, temperature 98.2 F (36.8 C), resp. rate 17, height _0  (1.803 m), weight (!) 136.5 kg (300 lb 14.4 oz), SpO2 91 %.  PHYSICAL EXAMINATION:   Physical Exam  GENERAL:  56 y.o.-year-old patient lying in the bed with no acute distress.  EYES: Pupils equal, round, reactive to light and accommodation. No scleral icterus. Extraocular muscles intact.  HEENT: Head atraumatic, normocephalic. Oropharynx and nasopharynx clear.  NECK:  Supple, no jugular venous distention. No thyroid enlargement, no tenderness.  LUNGS: Normal breath sounds bilaterally, no wheezing, rales, rhonchi. No use of accessory muscles of respiration.  CARDIOVASCULAR: S1, S2 normal. No murmurs, rubs, or  gallops.  ABDOMEN: Soft, nontender, nondistended. Bowel sounds present. No organomegaly or mass.  EXTREMITIES: No cyanosis, clubbing ,++ edema b/l.    NEUROLOGIC: Cranial nerves II through XII are intact. No focal Motor or sensory deficits b/l.   PSYCHIATRIC:  patient is alert and oriented x 3.  SKIN: No obvious rash, lesion, or ulcer.   LABORATORY PANEL:  CBC  Recent Labs Lab 11/12/16 0417  WBC 4.2  HGB 10.5*  HCT 32.5*  PLT 128*    Chemistries   Recent Labs Lab 11/11/16 1649 11/12/16 0417  NA 141 143  K 3.7 3.5  CL 105 109  CO2 29 29  GLUCOSE 91 105*  BUN 21* 22*  CREATININE 1.09 1.11  CALCIUM 9.2 8.8*  AST 27  --   ALT 13*  --   ALKPHOS 125  --   BILITOT 1.2  --    Cardiac Enzymes No results for input(s): TROPONINI in the last 168 hours. RADIOLOGY:  Dg Chest 1 View  Result Date: 11/11/2016 CLINICAL DATA:  Admitted for fluid overload.  Shortness of breath. EXAM: CHEST 1 VIEW COMPARISON:  September 01, 2015 FINDINGS: A calcified lymph node is seen in the mediastinum, unchanged. Cardiomegaly. The hila and mediastinum are unremarkable. No overt edema. Pulmonary venous congestion identified. No other acute abnormalities. IMPRESSION: Cardiomegaly.  Mild pulmonary venous congestion without overt edema. Electronically Signed   By: Dorise Bullion III M.D   On: 11/11/2016 18:47   ASSESSMENT AND PLAN:  [name]  is a 56 y.o. male with a known history of renal transplant, diabetes, coronary artery disease, congestive heart failure and atrial fibrillation. He has been gaining weight over the last few weeks. He has  met with his transplant team and cardiology team. He has gained 22 pounds. His diuretics haven't been working.  1. Acute Systolic Congestive heart Failure with 22 pound weight gain. -on Lasix 40 mg IV twice a day. Daily weights. Strict ins and outs.  2. Atrial fibrillation rate controlled.hold Eliquis since cardiology is planning a cardiac catheterization. Start  heparin drip   3. History of renal transplant continue immunosuppressive's.  4. Essential hypertension - resume home meds A   5. Muscle pain and weakness. resume Crestor  6. GERD on omeprazole  7. Type 2 diabetes mellitus. Sliding scale insulin and lower dose Lantus 15 units  Case discussed with Care Management/Social Worker. Management plans discussed with the patient, family and they are in agreement.  CODE STATUS: Full  DVT Prophylaxis: Heparin drip  TOTAL TIME TAKING CARE OF THIS PATIENT: 30 minutes.  >50% time spent on counselling and coordination of care patient and wife  POSSIBLE D/C IN 1-2* DAYS, DEPENDING ON CLINICAL CONDITION.  Note: This dictation was prepared with Dragon dictation along with smaller phrase technology. Any transcriptional errors that result from this process are unintentional.  [name] M.D on 11/12/2016 at 2:23 PM  Between 7am to 6pm - Pager - [phone]  After 6pm go to [email or web address] - password [email or web address] [facility]  Office  [phone]  CC: Primary care physician; [name], MD

## 2016-11-12 NOTE — Progress Notes (Signed)
ANTICOAGULATION CONSULT NOTE - Initial Consult  Pharmacy Consult for heparin Indication: atrial fibrillation  Allergies  Allergen Reactions  . Morphine Itching    Patient Measurements: Height: _0  (180.3 cm) Weight: (!) 300 lb 14.4 oz (136.5 kg) IBW/kg (Calculated) : 75.3 Heparin Dosing Weight: 106 kg  Vital Signs: Temp: 98.5 F (36.9 C) (08/21 0402) Temp Source: Oral (08/21 0402) BP: 130/65 (08/21 0402) Pulse Rate: 59 (08/21 0402)  Labs:  Recent Labs  11/11/16 1649 11/11/16 1943 11/12/16 0417  HGB 11.1*  --  10.5*  HCT 34.9*  --  32.5*  PLT 155  --  128*  APTT 44*  --  56*  LABPROT 20.8*  --   --   INR 1.77  --   --   HEPARINUNFRC  --  1.64*  --   CREATININE 1.09  --  1.11  CKTOTAL 79  --   --     Estimated Creatinine Clearance: 103.6 mL/min (by C-G formula based on SCr of 1.11 mg/dL).   Medical History: Past Medical History:  Diagnosis Date  . Allergy    seasonal  . Amnestic MCI (mild cognitive impairment with memory loss)   . Asthma   . Atrial fibrillation ([place])   . Atrial fibrillation ([place]) 2016  . Benign neoplasm of colon   . CAD (coronary artery disease) 2005   had CABG  . CHF (congestive heart failure) ([place])   . Chronic venous stasis dermatitis of both lower extremities   . Cytomegaloviral disease ([place]) 2017  . Depression   . Diabetes mellitus   . Diverticulosis   . Dyspnea   . Erectile dysfunction   . ESRD (end stage renal disease) ([place]) 2005   Dialysis then renal transplant  . GERD (gastroesophageal reflux disease)   . Gout   . Hyperlipidemia    low since renal failure  . Hypertension   . Kidney transplant status, cadaveric 2012  . Obesity   . Pneumonia   . Purpura ([place])   . Sleep apnea    bipap with oxygen 2 l  . Trigger finger   . Ulcer 05/2016   Left shin  . Wears glasses     Assessment: Pharmacy consulted to dose and monitor heparin drip in this 56 year old male for atrial fibrillation.   Patient was taking apixaban  for atrial fibrillation PTA and is now being converted to a heparin drip. Last recorded apixaban dose was 8/20 at 0830.  Consult initially entered for heparin drip to begin tomorrow morning. EKG reads atrial fibrillation on admission. Per discussion with MD, will begin heparin drip tonight at least 12 hours post last apixaban dose with no bolus.  Baseline labs ordered. Baseline heparin level ordered as add-on.   Goal of Therapy:  Heparin level 0.3-0.7 units/ml aPTT 66-102 seconds Monitor platelets by anticoagulation protocol: Yes   Plan:  No bolus Begin heparin 1250 units/hr at 2130 this evening Will check HL and APTT 6 hours after infusion begins. If baseline HL is elevated, will need to dose off of APTT. CBC ordered with AM labs tomorrow.  8/21 0400 aPTT 56. Increase to 1350 units/hr and Recheck aPTT in 6 hours.  Eloise Harman, PharmD Clinical Pharmacist 11/12/2016,5:36 AM

## 2016-11-12 NOTE — Discharge Instructions (Signed)
Heart Failure Clinic appointment on November 19 2016 at 9:00am with Darylene Price, [place]. Please call [phone] to reschedule.

## 2016-11-13 DIAGNOSIS — E1121 Type 2 diabetes mellitus with diabetic nephropathy: Secondary | ICD-10-CM

## 2016-11-13 DIAGNOSIS — I5023 Acute on chronic systolic (congestive) heart failure: Secondary | ICD-10-CM

## 2016-11-13 DIAGNOSIS — Z794 Long term (current) use of insulin: Secondary | ICD-10-CM

## 2016-11-13 DIAGNOSIS — I25118 Atherosclerotic heart disease of native coronary artery with other forms of angina pectoris: Secondary | ICD-10-CM

## 2016-11-13 LAB — APTT
APTT: 64 s — AB (ref 24–36)
aPTT: 58 seconds — ABNORMAL HIGH (ref 24–36)
aPTT: 62 seconds — ABNORMAL HIGH (ref 24–36)

## 2016-11-13 LAB — CBC
HCT: 32.7 % — ABNORMAL LOW (ref 40.0–52.0)
HEMOGLOBIN: 10.5 g/dL — AB (ref 13.0–18.0)
MCH: 23 pg — ABNORMAL LOW (ref 26.0–34.0)
MCHC: 32.1 g/dL (ref 32.0–36.0)
MCV: 71.5 fL — ABNORMAL LOW (ref 80.0–100.0)
Platelets: 131 10*3/uL — ABNORMAL LOW (ref 150–440)
RBC: 4.58 MIL/uL (ref 4.40–5.90)
RDW: 17.9 % — ABNORMAL HIGH (ref 11.5–14.5)
WBC: 4 10*3/uL (ref 3.8–10.6)

## 2016-11-13 LAB — HEPARIN LEVEL (UNFRACTIONATED): HEPARIN UNFRACTIONATED: 0.54 [IU]/mL (ref 0.30–0.70)

## 2016-11-13 LAB — GLUCOSE, CAPILLARY
GLUCOSE-CAPILLARY: 65 mg/dL (ref 65–99)
GLUCOSE-CAPILLARY: 110 mg/dL — AB (ref 65–99)
GLUCOSE-CAPILLARY: 116 mg/dL — AB (ref 65–99)
Glucose-Capillary: 74 mg/dL (ref 65–99)
Glucose-Capillary: 107 mg/dL — ABNORMAL HIGH (ref 65–99)
Glucose-Capillary: 98 mg/dL (ref 65–99)

## 2016-11-13 LAB — BASIC METABOLIC PANEL
Anion gap: 5 (ref 5–15)
BUN: 23 mg/dL — ABNORMAL HIGH (ref 6–20)
CALCIUM: 8.6 mg/dL — AB (ref 8.9–10.3)
CO2: 28 mmol/L (ref 22–32)
Chloride: 106 mmol/L (ref 101–111)
Creatinine, Ser: 1.18 mg/dL (ref 0.61–1.24)
GLUCOSE: 83 mg/dL (ref 65–99)
Potassium: 3.4 mmol/L — ABNORMAL LOW (ref 3.5–5.1)
SODIUM: 139 mmol/L (ref 135–145)

## 2016-11-13 MED ORDER — HEPARIN (PORCINE) IN NACL 100-0.45 UNIT/ML-% IJ SOLN
2100.0000 [IU]/h | INTRAMUSCULAR | Status: DC
  Administered 2016-11-13 – 2016-11-14 (×2): 1900 [IU]/h via INTRAVENOUS
  Filled 2016-11-13 (×3): qty 250

## 2016-11-13 MED ORDER — FUROSEMIDE 10 MG/ML IJ SOLN
60.0000 mg | Freq: Two times a day (BID) | INTRAMUSCULAR | Status: DC
  Administered 2016-11-13: 60 mg via INTRAVENOUS
  Filled 2016-11-13 (×2): qty 6

## 2016-11-13 MED ORDER — INSULIN GLARGINE 100 UNIT/ML ~~LOC~~ SOLN
10.0000 [IU] | Freq: Every day | SUBCUTANEOUS | Status: DC
  Administered 2016-11-13 – 2016-11-15 (×3): 10 [IU] via SUBCUTANEOUS
  Filled 2016-11-13 (×4): qty 0.1

## 2016-11-13 MED ORDER — POTASSIUM CHLORIDE ER 8 MEQ PO CPCR: 40.0000 meq | ORAL_CAPSULE | Freq: Two times a day (BID) | ORAL | Status: DC

## 2016-11-13 MED ORDER — POTASSIUM CHLORIDE ER 10 MEQ PO CPCR
40.0000 meq | ORAL_CAPSULE | Freq: Two times a day (BID) | ORAL | Status: DC
  Administered 2016-11-13 – 2016-11-14 (×2): 40 meq via ORAL
  Filled 2016-11-13 (×2): qty 4

## 2016-11-13 MED ORDER — POTASSIUM CHLORIDE CRYS ER 10 MEQ PO TBCR
40.0000 meq | EXTENDED_RELEASE_TABLET | Freq: Two times a day (BID) | ORAL | Status: DC
  Filled 2016-11-13: qty 4

## 2016-11-13 MED ORDER — POTASSIUM CHLORIDE CRYS ER 10 MEQ PO TBCR
20.0000 meq | EXTENDED_RELEASE_TABLET | Freq: Once | ORAL | Status: AC
  Administered 2016-11-13: 20 meq via ORAL

## 2016-11-13 NOTE — Progress Notes (Addendum)
ANTICOAGULATION CONSULT NOTE - Initial Consult  Pharmacy Consult for heparin Indication: atrial fibrillation  Allergies  Allergen Reactions  . Morphine Itching    Patient Measurements: Height: _0  (180.3 cm) Weight: 296 lb 8 oz (134.5 kg) IBW/kg (Calculated) : 75.3 Heparin Dosing Weight: 106 kg  Vital Signs: Temp: 97.9 F (36.6 C) (08/22 2007) Temp Source: Oral (08/22 2007) BP: 147/71 (08/22 2007) Pulse Rate: 45 (08/22 2007)  Labs:  Recent Labs  11/11/16 1649 11/11/16 1943 11/12/16 0417  11/13/16 0253 11/13/16 1118 11/13/16 1910  HGB 11.1*  --  10.5*  --  10.5*  --   --   HCT 34.9*  --  32.5*  --  32.7*  --   --   PLT 155  --  128*  --  131*  --   --   APTT 44*  --  56*  < > 64* 58* 62*  LABPROT 20.8*  --   --   --   --   --   --   INR 1.77  --   --   --   --   --   --   HEPARINUNFRC  --  1.64* 1.18*  --  0.54  --   --   CREATININE 1.09  --  1.11  --  1.18  --   --   CKTOTAL 79  --   --   --   --   --   --   < > = values in this interval not displayed.  Estimated Creatinine Clearance: 96.7 mL/min (by C-G formula based on SCr of 1.18 mg/dL).   Medical History: Past Medical History:  Diagnosis Date  . Allergy    seasonal  . Amnestic MCI (mild cognitive impairment with memory loss)   . Asthma   . Atrial fibrillation ([place])   . Atrial fibrillation ([place]) 2016  . Benign neoplasm of colon   . CAD (coronary artery disease) 2005   had CABG  . CHF (congestive heart failure) ([place])   . Chronic venous stasis dermatitis of both lower extremities   . Cytomegaloviral disease ([place]) 2017  . Depression   . Diabetes mellitus   . Diverticulosis   . Dyspnea   . Erectile dysfunction   . ESRD (end stage renal disease) ([place]) 2005   Dialysis then renal transplant  . GERD (gastroesophageal reflux disease)   . Gout   . Hyperlipidemia    low since renal failure  . Hypertension   . Kidney transplant status, cadaveric 2012  . Obesity   . Pneumonia   . Purpura ([place])    . Sleep apnea    bipap with oxygen 2 l  . Trigger finger   . Ulcer 05/2016   Left shin  . Wears glasses     Assessment: Pharmacy consulted to dose and monitor heparin drip in this 56 year old male for atrial fibrillation. Was taking apixaban PTA but is being converted to heparin drip.  Goal of Therapy:  Heparin level 0.3-0.7 units/ml aPTT 66-102 seconds Monitor platelets by anticoagulation protocol: Yes   Plan:  APTT = 62 seconds (slightly below goal of 66-102 seconds). Will increase heparin infusion to 1900 units/hr and check APTT and HL in 6 hours.  Once APTT and HL correlate, can monitor using HL. CBC ordered with AM labs tomorrow.  Lenis Noon, PharmD, BCPS Clinical Pharmacist 11/13/2016,8:12 PM   8/23 0200 aPTT 69, heparin level 0.31. Now correlating in therapeutic range. Will continue to  follow by heparin level only. Recheck heparin level and CBC with tomorrow AM labs.  Sim Boast, PharmD, BCPS  11/14/16 3:56 AM

## 2016-11-13 NOTE — Consult Note (Signed)
Patient admitted to hospital with dx of Acute on Chronic CHF.  Last EF from TEE on 10/16/2016 was 45 to 50%.  Reviewed booklet, "Living Better with Heart Failure" with patient. Reviewed diagnosis and definition of HF with patient.  Upon asking patient about daily weights, but stated, "I weigh myself everyday and that's how I knew I was fluid overloaded.  The lasix just wasn't working."  Discussed the HF zones with patient and symptoms of HF.  Patient stated he tried to tell his physicians he was overloaded, but no one would listen to him. As a result, patient stated he is switching his nephrologist to Dr. Candiss Norse.  Patient's cardiologist is [name]. Patient very knowledgeable about his medications and rationale for taking them.  Discussed diet with patient.  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  He stated he has a herb garden at home and uses all kinds of seasonings including Mrs. Dash when cooking.  Discussed exercise with patient.  Patient stated prior to the recent CHF exacerbation patient was exercising 5 x / week at [employer].  With the weight gain and SOB he has not been able to exercise. Patient stated he understands the importance of exercise for  his heart and overall health.  (Patient does not smoke.)  Discussed Cardiac Rehab with patient.  Patient stated he participated in Cardiac Rehab when he had CABG in 2004. Benefits of Cardiac Rehab discussed.    Patient is scheduled for a cardiac cath tomorrow.    I informed patient I would be back to see him tomorrow after his cath and at that time we would discuss Cardiac Rehab vs Pulmonary Rehab depending on the findings of the cath.  Heart Failure Clinic was discussed with patient.  Patient has an appointment to be seen in [facility] next week.  Patient asking questions about the vest for measuring fluid in the lungs.  Patient expressed interest in this vest as he would  like to use all resources to determine if he is volume overloaded.  He also stated no one could tell him what his dry weight really is or should be.  Emailed [place], NP in [facility], to obtain information on fluid detection vest to share with patient.  Patient thanked me for coming in to speak with him.    Roanna Epley, RN, BSN, [facility] Cardiovascular and Pulmonary Nurse Navigator Total time spent educating patient = 75 minutes.

## 2016-11-13 NOTE — Progress Notes (Signed)
[facility], [place] 11/13/16  Subjective:  Doing better today States he is not coughing as much and is able to take deep breaths No wheezing Weight is lower by 1.5 kg   Objective:  Vital signs in last 24 hours:  Temp:  [97.7 F (36.5 C)-98.2 F (36.8 C)] 97.7 F (36.5 C) (08/22 0454) Pulse Rate:  [54-71] 71 (08/22 0725) Resp:  [17-18] 18 (08/22 0725) BP: (124-150)/(57-79) 133/63 (08/22 0725) SpO2:  [91 %-95 %] 94 % (08/22 0725) Weight:  [134.5 kg (296 lb 8 oz)] 134.5 kg (296 lb 8 oz) (08/22 0454)  Weight change: -0.34 kg (-12 oz) Filed Weights   11/11/16 1700 11/12/16 0402 11/13/16 0454  Weight: 134.8 kg (297 lb 4 oz) (!) 136.5 kg (300 lb 14.4 oz) 134.5 kg (296 lb 8 oz)    Intake/Output:    Intake/Output Summary (Last 24 hours) at 11/13/16 0454 Last data filed at 11/13/16 0935  Gross per 24 hour  Intake            934.3 ml  Output             2170 ml  Net          -1235.7 ml     Physical Exam: General: NAD, sitting up in chair  HEENT anicteric  Neck supple  Pulm/lungs Improved exam, mild crackles at bases  CVS/Heart Regular, no rub  Abdomen:  Distended, non tender  Extremities: + edema, wearing compression socks  Neurologic: Alert, oreinted  Skin: No acute lesions  Access:  Left arm AVF       Basic Metabolic Panel:   Recent Labs Lab 11/11/16 1649 11/12/16 0417 11/13/16 0253  NA 141 143 139  K 3.7 3.5 3.4*  CL 105 109 106  CO2 _0 GLUCOSE 91 105* 83  BUN 21* 22* 23*  CREATININE 1.09 1.11 1.18  CALCIUM 9.2 8.8* 8.6*     CBC:  Recent Labs Lab 11/11/16 1649 11/12/16 0417 11/13/16 0253  WBC 4.9 4.2 4.0  HGB 11.1* 10.5* 10.5*  HCT 34.9* 32.5* 32.7*  MCV 72.0* 72.3* 71.5*  PLT 155 128* 131*     No results found for: HEPBSAG, HEPBSAB, HEPBIGM    Microbiology:  No results found for this or any previous visit (from the past 240 hour(s)).  Coagulation Studies:  Recent Labs  11/11/16 1649   LABPROT 20.8*  INR 1.77    Urinalysis:  Recent Labs  11/11/16 2107  COLORURINE YELLOW*  LABSPEC 1.009  PHURINE 5.0  GLUCOSEU NEGATIVE  HGBUR SMALL*  BILIRUBINUR NEGATIVE  KETONESUR NEGATIVE  PROTEINUR NEGATIVE  NITRITE NEGATIVE  LEUKOCYTESUR NEGATIVE      Imaging: Dg Chest 1 View  Result Date: 11/11/2016 CLINICAL DATA:  Admitted for fluid overload.  Shortness of breath. EXAM: CHEST 1 VIEW COMPARISON:  September 01, 2015 FINDINGS: A calcified lymph node is seen in the mediastinum, unchanged. Cardiomegaly. The hila and mediastinum are unremarkable. No overt edema. Pulmonary venous congestion identified. No other acute abnormalities. IMPRESSION: Cardiomegaly.  Mild pulmonary venous congestion without overt edema. Electronically Signed   By: Dorise Bullion III M.D   On: 11/11/2016 18:47     Medications:   . heparin 1,750 Units/hr (11/13/16 0411)   . amLODipine  5 mg Oral Daily  . buPROPion  75 mg Oral BID  . furosemide  40 mg Intravenous BID  . insulin aspart  0-5 Units Subcutaneous QHS  . insulin aspart  0-9 Units Subcutaneous  TID WC  . insulin glargine  15 Units Subcutaneous QHS  . lisinopril  40 mg Oral Daily  . multivitamin with minerals  1 tablet Oral Daily  . mycophenolate  360 mg Oral BID  . pantoprazole  40 mg Oral Daily  . potassium chloride  20 mEq Oral BID  . rosuvastatin  10 mg Oral Daily  . sodium chloride flush  3 mL Intravenous Q12H  . spironolactone  25 mg Oral Daily  . sulfamethoxazole-trimethoprim  1 tablet Oral Once per day on Mon Wed Fri  . tacrolimus  1 mg Oral BID   acetaminophen **OR** acetaminophen, fluticasone, ondansetron **OR** ondansetron (ZOFRAN) IV, oxyCODONE-acetaminophen  Assessment/ Plan:  56 y.o. male with Diabetes, hypertension, Coronary disease, CABG, deceased donor renal transplant 09/13/2010, Gastric sleeve surgery 03/14/2014  was admitted on 11/11/2016 with worsening fluid overload. 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  Treated with valganciclovir. Hospitalization for cellulitis on left shin again in 2017.  1. Anasarca, worsening fluid overload Agree with IV Lasix twice a day. Agree with continuing Aldactone - Discussed low-salt diet with patient - Daily weights - Assess response of current therapy - Urinalysis is negative for protein.  2. Renal transplant in June 2012 immunosuppression induction with Campath, now maintenance with tacrolimus and mycophenolate.  1 month allograft biopsy July 2012- recovering ATN with early tubular atrophy, interstitial fibrosis Repeat biopsy March 2017 for increasing proteinuria- no acute rejection, showed diabetic, neuropathy, FSGS  - Continue home dose of immunosuppression Currently mycophenolate 360 mg twice a day Tacrolimus 1 mg twice a day   LOS: 2 [facility] 8/22/20189:38 [place], [place]

## 2016-11-13 NOTE — Progress Notes (Signed)
ANTICOAGULATION CONSULT NOTE - Initial Consult  Pharmacy Consult for heparin Indication: atrial fibrillation  Allergies  Allergen Reactions  . Morphine Itching    Patient Measurements: Height: _0  (180.3 cm) Weight: 296 lb 8 oz (134.5 kg) IBW/kg (Calculated) : 75.3 Heparin Dosing Weight: 106 kg  Vital Signs: Temp: 97.7 F (36.5 C) (08/22 0454) Temp Source: Oral (08/22 0454) BP: 152/83 (08/22 1131) Pulse Rate: 86 (08/22 1131)  Labs:  Recent Labs  11/11/16 1649 11/11/16 1943 11/12/16 0417  11/12/16 1853 11/13/16 0253 11/13/16 1118  HGB 11.1*  --  10.5*  --   --  10.5*  --   HCT 34.9*  --  32.5*  --   --  32.7*  --   PLT 155  --  128*  --   --  131*  --   APTT 44*  --  56*  < > 61* 64* 58*  LABPROT 20.8*  --   --   --   --   --   --   INR 1.77  --   --   --   --   --   --   HEPARINUNFRC  --  1.64* 1.18*  --   --  0.54  --   CREATININE 1.09  --  1.11  --   --  1.18  --   CKTOTAL 79  --   --   --   --   --   --   < > = values in this interval not displayed.  Estimated Creatinine Clearance: 96.7 mL/min (by C-G formula based on SCr of 1.18 mg/dL).   Medical History: Past Medical History:  Diagnosis Date  . Allergy    seasonal  . Amnestic MCI (mild cognitive impairment with memory loss)   . Asthma   . Atrial fibrillation ([place])   . Atrial fibrillation ([name]) 2016  . Benign neoplasm of colon   . CAD (coronary artery disease) 2005   had CABG  . CHF (congestive heart failure) ([place])   . Chronic venous stasis dermatitis of both lower extremities   . Cytomegaloviral disease ([place]) 2017  . Depression   . Diabetes mellitus   . Diverticulosis   . Dyspnea   . Erectile dysfunction   . ESRD (end stage renal disease) ([place]) 2005   Dialysis then renal transplant  . GERD (gastroesophageal reflux disease)   . Gout   . Hyperlipidemia    low since renal failure  . Hypertension   . Kidney transplant status, cadaveric 2012  . Obesity   . Pneumonia   . Purpura ([place])    . Sleep apnea    bipap with oxygen 2 l  . Trigger finger   . Ulcer 05/2016   Left shin  . Wears glasses     Assessment: Pharmacy consulted to dose and monitor heparin drip in this 56 year old male for atrial fibrillation. Baseline labs ordered.  Patient was taking apixaban for atrial fibrillation PTA and is now being converted to a heparin drip. Last recorded apixaban dose was 8/20 at 0830.  Consult initially entered for heparin drip to begin tomorrow morning. EKG reads atrial fibrillation on admission. Per discussion with MD, will begin heparin drip tonight at least 12 hours post last apixaban dose with no bolus.  Goal of Therapy:  Heparin level 0.3-0.7 units/ml aPTT 66-102 seconds Monitor platelets by anticoagulation protocol: Yes   Plan:  APTT = 58 seconds (below goal of 66-102 seconds). Will increase heparin infusion  to 1850 units/hr and check APTT and HL in 6 hours. RN confirmed that drip has not been turned off. Once APTT and HL correlate, can monitor using HL. As of this AM did not correlate. CBC ordered with AM labs tomorrow.  Ramond Dial, PharmD, BCPS Clinical Pharmacist 11/13/2016,12:58 PM

## 2016-11-13 NOTE — Consult Note (Signed)
Followed-up with patient regarding his questions about the vest for HF patients in determining the fluid status in lungs.  I explained to patient that I had reached out to [facility], NP in [facility], regarding vest for fluid volume, who explained there is a vest in the [facility] HF Clinic which gives an immediate reading in the office.  This reading guides the provider as to whether the symptoms are related to HF or not.  If the reading is normal then the symptoms are not HF related.  The reading does not determine dry weight as the reading is a percentage.  I also reached out to [employer], PA-C, regarding the patient's interest in this vest. Per Christell Faith, PA-C , Mr. Cedar would not qualify given his BMI is greater than 35. In addition, the vest is greater than $20,000 and would not benefit him given his BMI. This vest appears to have fallen out of favor given issues with comorbidities including BMI and COPD in some patients.   I shared this information provided by Christell Faith, PA-C with Mr. Bail.  I also informed the patient of his appointment scheduled in the HF Clinic next week.  I explained to him our protocol for HF patients is to have them seen in the HF Clinic within 7 days of discharge from hospital.  Explained to patient the HF Clinic appointment does not replace his cardiologist, PCP, nor his nephrologist.  Patient asked appropriate questions and wanted to know what to expect during the initial visit.  Questions answered.  Informed patient I would follow-up with him again tomorrow after his cardiac cath.  Patient thanked me for the information.    Roanna Epley, RN, BSN, [facility] Cardiovascular and Pulmonary Nurse Navigator  15:20 - 15:46

## 2016-11-13 NOTE — Care Management (Signed)
Barrier- requiring cardiac cath and eliquis has to be on hold for three days.

## 2016-11-13 NOTE — Progress Notes (Signed)
Patient Name: [name] Date of Encounter: 11/13/2016  Primary Cardiologist: [facility] Problem List     Principal Problem:   Acute on chronic systolic CHF (congestive heart failure) (HCC) Active Problems:   CAD (coronary artery disease)   Hypertension   Hyperlipidemia   ESRD (end stage renal disease) (HCC)   Type 2 diabetes, controlled, with renal manifestation (HCC)   Chronic atrial fibrillation (HCC)   H/O kidney transplant   Chronic venous insufficiency     Subjective   Ambulating in the halls without issues. SOB improving, though continues to note abdominal swelling and LE swelling. Documented UOP 2.3 L for the admission, 775 mL for the past 24 hours. Weight down to 296 pounds (dry weight ~ 275 pounds). Renal function stable at 1.18, K+ low at 3.4, HGB 10.5 (stable), PLT 131 (stable), WBC 4.0  Inpatient Medications    Scheduled Meds: . amLODipine  5 mg Oral Daily  . buPROPion  75 mg Oral BID  . furosemide  40 mg Intravenous BID  . insulin aspart  0-5 Units Subcutaneous QHS  . insulin aspart  0-9 Units Subcutaneous TID WC  . insulin glargine  15 Units Subcutaneous QHS  . lisinopril  40 mg Oral Daily  . multivitamin with minerals  1 tablet Oral Daily  . mycophenolate  360 mg Oral BID  . pantoprazole  40 mg Oral Daily  . potassium chloride  20 mEq Oral BID  . rosuvastatin  10 mg Oral Daily  . sodium chloride flush  3 mL Intravenous Q12H  . spironolactone  25 mg Oral Daily  . sulfamethoxazole-trimethoprim  1 tablet Oral Once per day on Mon Wed Fri  . tacrolimus  1 mg Oral BID   Continuous Infusions: . heparin 1,750 Units/hr (11/13/16 0411)   PRN Meds: acetaminophen **OR** acetaminophen, fluticasone, ondansetron **OR** ondansetron (ZOFRAN) IV, oxyCODONE-acetaminophen   Vital Signs    Vitals:   11/12/16 1732 11/12/16 2003 11/13/16 0454 11/13/16 0725  BP: (!) 146/57 (!) 145/63 124/68 133/63  Pulse:  (!) 55 (!) 54 71  Resp:  18  18  Temp:  97.7 F  (36.5 C) 97.7 F (36.5 C)   TempSrc:  Oral Oral   SpO2:  95% 94% 94%  Weight:   296 lb 8 oz (134.5 kg)   Height:        Intake/Output Summary (Last 24 hours) at 11/13/16 0857 Last data filed at 11/13/16 0854  Gross per 24 hour  Intake           1174.3 ml  Output             2170 ml  Net           -995.7 ml   Filed Weights   11/11/16 1700 11/12/16 0402 11/13/16 0454  Weight: 297 lb 4 oz (134.8 kg) (!) 300 lb 14.4 oz (136.5 kg) 296 lb 8 oz (134.5 kg)    Physical Exam    GEN: Well nourished, well developed, in no acute distress.  HEENT: Grossly normal.  Neck: Supple, JVD elevated to the angle of the mandible, no carotid bruits, or masses. Cardiac: Irregularly irregular, II/VI systolic murmur, no rubs, or gallops. No clubbing, cyanosis, 1+ bilateral pitting edema.  Radials/DP/PT 2+ and equal bilaterally.  Respiratory:  Diminished breath sounds bilaterally with bibasilar crackles. GI: Soft, nontender, distended, BS + x 4. MS: no deformity or atrophy. Skin: warm and dry, no rash. Neuro:  Strength and sensation are intact.  Psych: AAOx3.  Normal affect.  Labs    CBC  Recent Labs  11/12/16 0417 11/13/16 0253  WBC 4.2 4.0  HGB 10.5* 10.5*  HCT 32.5* 32.7*  MCV 72.3* 71.5*  PLT 128* 419*   Basic Metabolic Panel  Recent Labs  11/12/16 0417 11/13/16 0253  NA 143 139  K 3.5 3.4*  CL 109 106  CO2 29 28  GLUCOSE 105* 83  BUN 22* 23*  CREATININE 1.11 1.18  CALCIUM 8.8* 8.6*   Liver Function Tests  Recent Labs  11/11/16 1649  AST 27  ALT 13*  ALKPHOS 125  BILITOT 1.2  PROT 8.2*  ALBUMIN 3.8   No results for input(s): LIPASE, AMYLASE in the last 72 hours. Cardiac Enzymes  Recent Labs  11/11/16 1649  CKTOTAL 79   BNP Invalid input(s): POCBNP D-Dimer No results for input(s): DDIMER in the last 72 hours. Hemoglobin A1C No results for input(s): HGBA1C in the last 72 hours. Fasting Lipid Panel No results for input(s): CHOL, HDL, LDLCALC, TRIG,  CHOLHDL, LDLDIRECT in the last 72 hours. Thyroid Function Tests  Recent Labs  11/11/16 1649  TSH 2.513    Telemetry    Afib, 50s to 60s bpm - Personally Reviewed  ECG    n/a - Personally Reviewed  Radiology    Dg Chest 1 View  Result Date: 11/11/2016 IMPRESSION: Cardiomegaly.  Mild pulmonary venous congestion without overt edema. Electronically Signed   By: Dorise Bullion III M.D   On: 11/11/2016 18:47    Cardiac Studies   TTE 10/16/2016: Study Conclusions  - Left ventricle: The cavity size was normal. There was moderate   concentric hypertrophy. Systolic function was mildly reduced. The   estimated ejection fraction was in the range of 45% to 50%. The   study is not technically sufficient to allow evaluation of LV   diastolic function. - Left atrium: The atrium was moderately dilated. - Right ventricle: Systolic function was normal. - Pulmonary arteries: Systolic pressure was within the normal   range.  Impressions:  - Very challenging image quality.  Patient Profile     56 y.o. male with history of CAD s/p CABG in 2004 with no recurrent ischemic events since, chronic systolic CHF/ICM with prior EF of 35-40% now improved to 45% by TTE in 09/2016, chronic Afib on Eliquis with prior significant bradycardia on small dose Coreg leading to discontinuation, ESRD previously on HD from 2004-2012 now s/p kidney transplant, anemia of chronic disease, DM2, HTN, HLD, obesity s/p gastric bypass in 08/2227 without complications, and OSA on CPAP who was directly admitted from our office on 8/20 with volume overload.  Assessment & Plan    1. Acute on chronic systolic CHF with RV failure: -Improving, patient feels like he is "50%" improved -He continues to appear volume overloaded, dry weight ~ 275 pounds -Increase  IV diuresis, with IV Lasix 60 mg bid -Continue KCl repletion -No indication to repeat echo given recent study from 09/2016 -Would not perform RHC yet given he  continues to be volume overloaded -After he is adequately diuresed, plan for RHC prior to discharge (will need a couple more days of IV diuresis prior to [place] given weights)  -Not on beta blocker given prior issue with significant bradycardia with Coreg -Continue lisinopril and spironolactone given his cardiomyopathy -Daily weights, strict Is and Os  2. Chronic Afib: -Rates currently well controlled -Asymptomatic   -Not on beta blocker given bradycardia -Eliquis on hold given plans for RHC prior  to discharge -Heparin gtt -Less likely his Afib is playing a role in his CHF  3. CAD: -Without angina -Continue current medical therapy  4. ESRD s/p renal transplant: -Nephrology on board -Renal function stable   5. HLD: -Statin  Signed, Christell Faith, PA-C [facility] Pager: [phone] 11/13/2016, 8:57 AM

## 2016-11-13 NOTE — Progress Notes (Signed)
[facility] at [place] NAME: [name]    MR#:  [id number]  DATE OF BIRTH:  1960-05-02  SUBJECTIVE:  Came in with increasing shortness of breath and weight gain found to have CHF Patient feels a whole lot better. He had 2100 cc urine output yesterday and almost 700 so far REVIEW OF SYSTEMS:   Review of Systems  Constitutional: Negative for chills, fever and weight loss.  HENT: Negative for ear discharge, ear pain and nosebleeds.   Eyes: Negative for blurred vision, pain and discharge.  Respiratory: Positive for shortness of breath. Negative for sputum production, wheezing and stridor.   Cardiovascular: Positive for orthopnea and leg swelling. Negative for chest pain, palpitations and PND.  Gastrointestinal: Negative for abdominal pain, diarrhea, nausea and vomiting.  Genitourinary: Negative for frequency and urgency.  Musculoskeletal: Negative for back pain and joint pain.  Neurological: Positive for weakness. Negative for sensory change, speech change and focal weakness.  Psychiatric/Behavioral: Negative for depression and hallucinations. The patient is not nervous/anxious.    Tolerating Diet:yes Tolerating PT: not needed  DRUG ALLERGIES:   Allergies  Allergen Reactions  . Morphine Itching    VITALS:  Blood pressure (!) 152/83, pulse 86, temperature 97.7 F (36.5 C), temperature source Oral, resp. rate 17, height _0  (1.803 m), weight 134.5 kg (296 lb 8 oz), SpO2 100 %.  PHYSICAL EXAMINATION:   Physical Exam  GENERAL:  56 y.o.-year-old patient lying in the bed with no acute distress.  EYES: Pupils equal, round, reactive to light and accommodation. No scleral icterus. Extraocular muscles intact.  HEENT: Head atraumatic, normocephalic. Oropharynx and nasopharynx clear.  NECK:  Supple, no jugular venous distention. No thyroid enlargement, no tenderness.  LUNGS: Normal breath sounds bilaterally, no wheezing, rales,  rhonchi. No use of accessory muscles of respiration.  CARDIOVASCULAR: S1, S2 normal. No murmurs, rubs, or gallops.  ABDOMEN: Soft, nontender, nondistended. Bowel sounds present. No organomegaly or mass.  EXTREMITIES: No cyanosis, clubbing ,++ edema b/l.    NEUROLOGIC: Cranial nerves II through XII are intact. No focal Motor or sensory deficits b/l.   PSYCHIATRIC:  patient is alert and oriented x 3.  SKIN: No obvious rash, lesion, or ulcer.   LABORATORY PANEL:  CBC  Recent Labs Lab 11/13/16 0253  WBC 4.0  HGB 10.5*  HCT 32.7*  PLT 131*    Chemistries   Recent Labs Lab 11/11/16 1649  11/13/16 0253  NA 141  < > 139  K 3.7  < > 3.4*  CL 105  < > 106  CO2 29  < > 28  GLUCOSE 91  < > 83  BUN 21*  < > 23*  CREATININE 1.09  < > 1.18  CALCIUM 9.2  < > 8.6*  AST 27  --   --   ALT 13*  --   --   ALKPHOS 125  --   --   BILITOT 1.2  --   --   < > = values in this interval not displayed. Cardiac Enzymes No results for input(s): TROPONINI in the last 168 hours. RADIOLOGY:  Dg Chest 1 View  Result Date: 11/11/2016 CLINICAL DATA:  Admitted for fluid overload.  Shortness of breath. EXAM: CHEST 1 VIEW COMPARISON:  September 01, 2015 FINDINGS: A calcified lymph node is seen in the mediastinum, unchanged. Cardiomegaly. The hila and mediastinum are unremarkable. No overt edema. Pulmonary venous congestion identified. No other acute abnormalities. IMPRESSION: Cardiomegaly.  Mild pulmonary  venous congestion without overt edema. Electronically Signed   By: Dorise Bullion III M.D   On: 11/11/2016 18:47   ASSESSMENT AND PLAN:  [name]  is a 56 y.o. male with a known history of renal transplant, diabetes, coronary artery disease, congestive heart failure and atrial fibrillation. He has been gaining weight over the last few weeks. He has met with his transplant team and cardiology team. He has gained 22 pounds. His diuretics haven't been working.  1. Acute Systolic Congestive heart Failure with  22 pound weight gain. -on Lasix 40 mg IV twice a day. Daily weights. Strict ins and outs. -Good urine output more than 3.5 L  2. Atrial fibrillation rate controlled.hold Eliquis since cardiology is planning a cardiac catheterization.  -  on IVheparin drip --- awaiting final decision for cardiology to see if they will be doing cardiac cath.  3. History of renal transplant - continue immunosuppressive's.  4. Essential hypertension - resume home meds   5. Hyperlipidemia -On  Crestor  6. GERD on omeprazole  7. Type 2 diabetes mellitus. Sliding scale insulin and lower dose Lantus 10 units  Case discussed with Care Management/Social Worker. Management plans discussed with the patient, family and they are in agreement.  CODE STATUS: Full  DVT Prophylaxis: Heparin drip  TOTAL TIME TAKING CARE OF THIS PATIENT: 30 minutes.  >50% time spent on counselling and coordination of care patient and wife  POSSIBLE D/C IN 1-2* DAYS, DEPENDING ON CLINICAL CONDITION.  Note: This dictation was prepared with Dragon dictation along with smaller phrase technology. Any transcriptional errors that result from this process are unintentional.  Tanya Crothers M.D on 11/13/2016 at 1:55 PM  Between 7am to 6pm - Pager - [phone]  After 6pm go to [email or web address] - password [email or web address] [facility]  Office  [phone]  CC: Primary care physician; [name], MD

## 2016-11-14 LAB — GLUCOSE, CAPILLARY
GLUCOSE-CAPILLARY: 91 mg/dL (ref 65–99)
GLUCOSE-CAPILLARY: 136 mg/dL — AB (ref 65–99)
Glucose-Capillary: 83 mg/dL (ref 65–99)
Glucose-Capillary: 81 mg/dL (ref 65–99)

## 2016-11-14 LAB — HEPARIN LEVEL (UNFRACTIONATED)
Heparin Unfractionated: 0.31 IU/mL (ref 0.30–0.70)
Heparin Unfractionated: 0.24 IU/mL — ABNORMAL LOW (ref 0.30–0.70)
Heparin Unfractionated: 0.38 IU/mL (ref 0.30–0.70)

## 2016-11-14 LAB — BASIC METABOLIC PANEL
ANION GAP: 5 (ref 5–15)
BUN: 25 mg/dL — ABNORMAL HIGH (ref 6–20)
CHLORIDE: 106 mmol/L (ref 101–111)
CO2: 28 mmol/L (ref 22–32)
Calcium: 8.9 mg/dL (ref 8.9–10.3)
Creatinine, Ser: 1.29 mg/dL — ABNORMAL HIGH (ref 0.61–1.24)
GFR calc non Af Amer: 60 mL/min — ABNORMAL LOW (ref >60)
Glucose, Bld: 187 mg/dL — ABNORMAL HIGH (ref 65–99)
Potassium: 4.4 mmol/L (ref 3.5–5.1)
Sodium: 139 mmol/L (ref 135–145)

## 2016-11-14 LAB — TROPONIN I
Troponin I: <0.03 ng/mL (ref <0.03)
Troponin I: <0.03 ng/mL (ref <0.03)
Troponin I: <0.03 ng/mL (ref <0.03)

## 2016-11-14 LAB — APTT: APTT: 69 s — AB (ref 24–36)

## 2016-11-14 MED ORDER — NITROGLYCERIN 0.4 MG SL SUBL
0.4000 mg | SUBLINGUAL_TABLET | SUBLINGUAL | Status: DC | PRN
  Administered 2016-11-14: 0.4 mg via SUBLINGUAL

## 2016-11-14 MED ORDER — HEPARIN BOLUS VIA INFUSION
1500.0000 [IU] | Freq: Once | INTRAVENOUS | Status: AC
  Administered 2016-11-14: 1500 [IU] via INTRAVENOUS
  Filled 2016-11-14: qty 1500

## 2016-11-14 MED ORDER — SODIUM CHLORIDE 0.9 % IV SOLN: 250.0000 mL | INTRAVENOUS | Status: DC | PRN

## 2016-11-14 MED ORDER — SODIUM CHLORIDE 0.9% FLUSH: 3.0000 mL | INTRAVENOUS | Status: DC | PRN

## 2016-11-14 MED ORDER — POTASSIUM CHLORIDE ER 10 MEQ PO TBCR
40.0000 meq | EXTENDED_RELEASE_TABLET | Freq: Two times a day (BID) | ORAL | Status: DC
  Administered 2016-11-15 – 2016-11-16 (×3): 40 meq via ORAL
  Filled 2016-11-14: qty 2
  Filled 2016-11-14: qty 4

## 2016-11-14 MED ORDER — SODIUM CHLORIDE 0.9% FLUSH: 3.0000 mL | Freq: Two times a day (BID) | INTRAVENOUS | Status: DC

## 2016-11-14 MED ORDER — METOLAZONE 5 MG PO TABS
5.0000 mg | ORAL_TABLET | Freq: Once | ORAL | Status: AC
  Administered 2016-11-14: 5 mg via ORAL
  Filled 2016-11-14: qty 1

## 2016-11-14 MED ORDER — FUROSEMIDE 10 MG/ML IJ SOLN
8.0000 mg/h | INTRAVENOUS | Status: DC
  Administered 2016-11-14: 8 mg/h via INTRAVENOUS
  Filled 2016-11-14: qty 25

## 2016-11-14 NOTE — Progress Notes (Signed)
[name] at [place] NAME: [name]    MR#:  [id number]  DATE OF BIRTH:  Jan 04, 1961  SUBJECTIVE:  Came in with increasing shortness of breath and weight gain found to have CHF Patient feels a whole lot better. He had 1200 cc urine output yesterday   REVIEW OF SYSTEMS:   Review of Systems  Constitutional: Negative for chills, fever and weight loss.  HENT: Negative for ear discharge, ear pain and nosebleeds.   Eyes: Negative for blurred vision, pain and discharge.  Respiratory: Positive for shortness of breath. Negative for sputum production, wheezing and stridor.   Cardiovascular: Positive for orthopnea and leg swelling. Negative for chest pain, palpitations and PND.  Gastrointestinal: Negative for abdominal pain, diarrhea, nausea and vomiting.  Genitourinary: Negative for frequency and urgency.  Musculoskeletal: Negative for back pain and joint pain.  Neurological: Positive for weakness. Negative for sensory change, speech change and focal weakness.  Psychiatric/Behavioral: Negative for depression and hallucinations. The patient is not nervous/anxious.    Tolerating Diet:yes Tolerating PT: not needed  DRUG ALLERGIES:   Allergies  Allergen Reactions  . Morphine Itching    VITALS:  Blood pressure 136/70, pulse 61, temperature 97.8 F (36.6 C), temperature source Oral, resp. rate 18, height 5' 11" (1.803 m), weight 133.4 kg (294 lb 1.6 oz), SpO2 92 %.  PHYSICAL EXAMINATION:   Physical Exam  GENERAL:  56 y.o.-year-old patient lying in the bed with no acute distress.  EYES: Pupils equal, round, reactive to light and accommodation. No scleral icterus. Extraocular muscles intact.  HEENT: Head atraumatic, normocephalic. Oropharynx and nasopharynx clear.  NECK:  Supple, no jugular venous distention. No thyroid enlargement, no tenderness.  LUNGS: Normal breath sounds bilaterally, no wheezing, rales, rhonchi. No use of accessory  muscles of respiration.  CARDIOVASCULAR: S1, S2 normal. No murmurs, rubs, or gallops.  ABDOMEN: Soft, nontender, nondistended. Bowel sounds present. No organomegaly or mass.  EXTREMITIES: No cyanosis, clubbing ,++ edema b/l.    NEUROLOGIC: Cranial nerves II through XII are intact. No focal Motor or sensory deficits b/l.   PSYCHIATRIC:  patient is alert and oriented x 3.  SKIN: No obvious rash, lesion, or ulcer.   LABORATORY PANEL:  CBC  Recent Labs Lab 11/13/16 0253  WBC 4.0  HGB 10.5*  HCT 32.7*  PLT 131*    Chemistries   Recent Labs Lab 11/11/16 1649  11/13/16 0253  NA 141  < > 139  K 3.7  < > 3.4*  CL 105  < > 106  CO2 29  < > 28  GLUCOSE 91  < > 83  BUN 21*  < > 23*  CREATININE 1.09  < > 1.18  CALCIUM 9.2  < > 8.6*  AST 27  --   --   ALT 13*  --   --   ALKPHOS 125  --   --   BILITOT 1.2  --   --   < > = values in this interval not displayed. Cardiac Enzymes  Recent Labs Lab 11/14/16 0728  TROPONINI <0.03   RADIOLOGY:  No results found. ASSESSMENT AND PLAN:  [name]  is a 56 y.o. male with a known history of renal transplant, diabetes, coronary artery disease, congestive heart failure and atrial fibrillation. He has been gaining weight over the last few weeks. He has met with his transplant team and cardiology team. He has gained 22 pounds. His diuretics haven't been working.  1. Acute  Systolic Congestive heart Failure with 22 pound weight gain. -on Lasix 40 mg IV twice a day. Daily weights. Strict ins and outs. -Patient still feels significantly volume overloaded. Cardiology recommends to see if he can be given IV Lasix drip. Await Dr. Candiss Norse to evaluate.  2. Atrial fibrillation rate controlled.hold Eliquis since cardiology is planning a cardiac catheterization.  -  on IVheparin drip --- awaiting final decision for cardiology to see if they will be doing cardiac cath.  3. History of renal transplant - continue immunosuppressive's.  4. Essential  hypertension - resume home meds   5. Hyperlipidemia -On  Crestor  6. GERD on omeprazole  7. Type 2 diabetes mellitus.  -Sliding scale insulin and lower dose Lantus 10 units  Case discussed with Care Management/Social Worker. Management plans discussed with the patient, family and they are in agreement.  CODE STATUS: Full  DVT Prophylaxis: Heparin drip  TOTAL TIME TAKING CARE OF THIS PATIENT: 30 minutes.  >50% time spent on counselling and coordination of care patient and wife  POSSIBLE D/C IN 1-2* DAYS, DEPENDING ON CLINICAL CONDITION.  Note: This dictation was prepared with Dragon dictation along with smaller phrase technology. Any transcriptional errors that result from this process are unintentional.  [name] M.D on 11/14/2016 at 11:55 AM  Between 7am to 6pm - Pager - [phone]  After 6pm go to [email or web address] - password [email or web address] [facility]  Office  [phone]  CC: Primary care physician; Carl Carbon, MD

## 2016-11-14 NOTE — Care Management (Signed)
Barrier- to be started on lasix drip

## 2016-11-14 NOTE — Progress Notes (Signed)
Patient complaining of sudden mid sternal chest pain that awakened him from sleep, rating pain 7/10. VSS. Sublingual nitro given. Some relief noted, pain 2/10. EKG unremarkable. MD Marcille Blanco aware, MD to place orders. Patient began to experience some nausea, IV Zofran given. Patient states that he felt a lot better after dry heaving. No other other need expressed. Will continue to monitor.   [name]

## 2016-11-14 NOTE — Progress Notes (Addendum)
Patient is very frustrated and concerned that his output has dramatically decreased today after being on the lasix gtt.  He is requesting to speak to a doctor.  Wife is at bedside.  Dr. Candiss Norse has been paged, awaiting callback.   Spoke to Dr. Candiss Norse.  He advised to continue the lasix gtt for now and potentially increase or switch back to IV push tomorrow based off of results.    Spoke with Dr. Rockey Situ.  Right heart cath will be planned for tomorrow AM with [name] as long as patient is agreeable.

## 2016-11-14 NOTE — Progress Notes (Signed)
[facility] [place] 11/14/16  Subjective:  Doing better today States he is not coughing as much and is able to take deep breaths No wheezing Weight is lower 134.4->133.4 kg   Objective:  Vital signs in last 24 hours:  Temp:  [97.8 F (36.6 C)-98 F (36.7 C)] 97.8 F (36.6 C) (08/23 1144) Pulse Rate:  [45-61] 61 (08/23 1144) Resp:  [18-19] 18 (08/23 1144) BP: (128-147)/(64-71) 136/70 (08/23 1144) SpO2:  [92 %-97 %] 92 % (08/23 1144) Weight:  [133.4 kg (294 lb 1.6 oz)] 133.4 kg (294 lb 1.6 oz) (08/23 0321)  Weight change: -1.089 kg (-2 lb 6.4 oz) Filed Weights   11/12/16 0402 11/13/16 0454 11/14/16 0321  Weight: (!) 136.5 kg (300 lb 14.4 oz) 134.5 kg (296 lb 8 oz) 133.4 kg (294 lb 1.6 oz)    Intake/Output:    Intake/Output Summary (Last 24 hours) at 11/14/16 1232 Last data filed at 11/14/16 1017  Gross per 24 hour  Intake           521.32 ml  Output              480 ml  Net            41.32 ml     Physical Exam: General: NAD, sitting up in chair  HEENT anicteric  Neck supple  Pulm/lungs Improved exam, mild crackles at bases  CVS/Heart Regular, no rub  Abdomen:  Distended, non tender  Extremities: + edema, wearing compression socks  Neurologic: Alert, oreinted  Skin: No acute lesions  Access:  Left arm AVF       Basic Metabolic Panel:   Recent Labs Lab 11/11/16 1649 11/12/16 0417 11/13/16 0253  NA 141 143 139  K 3.7 3.5 3.4*  CL 105 109 106  CO2 _0 GLUCOSE 91 105* 83  BUN 21* 22* 23*  CREATININE 1.09 1.11 1.18  CALCIUM 9.2 8.8* 8.6*     CBC:  Recent Labs Lab 11/11/16 1649 11/12/16 0417 11/13/16 0253  WBC 4.9 4.2 4.0  HGB 11.1* 10.5* 10.5*  HCT 34.9* 32.5* 32.7*  MCV 72.0* 72.3* 71.5*  PLT 155 128* 131*     No results found for: HEPBSAG, HEPBSAB, HEPBIGM    Microbiology:  No results found for this or any previous visit (from the past 240 hour(s)).  Coagulation Studies:  Recent Labs  11/11/16 1649  LABPROT 20.8*  INR 1.77    Urinalysis:  Recent Labs  11/11/16 2107  COLORURINE YELLOW*  LABSPEC 1.009  PHURINE 5.0  GLUCOSEU NEGATIVE  HGBUR SMALL*  BILIRUBINUR NEGATIVE  KETONESUR NEGATIVE  PROTEINUR NEGATIVE  NITRITE NEGATIVE  LEUKOCYTESUR NEGATIVE      Imaging: No results found.   Medications:   . furosemide (LASIX) infusion 8 mg/hr (11/14/16 1120)  . heparin 1,900 Units/hr (11/14/16 0151)   . amLODipine  5 mg Oral Daily  . buPROPion  75 mg Oral BID  . insulin aspart  0-5 Units Subcutaneous QHS  . insulin aspart  0-9 Units Subcutaneous TID WC  . insulin glargine  10 Units Subcutaneous QHS  . lisinopril  40 mg Oral Daily  . multivitamin with minerals  1 tablet Oral Daily  . mycophenolate  360 mg Oral BID  . pantoprazole  40 mg Oral Daily  . potassium chloride  40 mEq Oral BID  . rosuvastatin  10 mg Oral Daily  . sodium chloride flush  3 mL Intravenous Q12H  . spironolactone  25 mg  Oral Daily  . sulfamethoxazole-trimethoprim  1 tablet Oral Once per day on Mon Wed Fri  . tacrolimus  1 mg Oral BID   acetaminophen **OR** acetaminophen, fluticasone, nitroGLYCERIN, ondansetron **OR** ondansetron (ZOFRAN) IV, oxyCODONE-acetaminophen  Assessment/ Plan:  56 y.o. male with Diabetes, hypertension, Coronary disease, CABG, deceased donor renal transplant 09/13/2010, Gastric sleeve surgery 03/14/2014  was admitted on 11/11/2016 with worsening fluid overload. 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 Treated with valganciclovir. Hospitalization for cellulitis on left shin again in 2017.  1. Anasarca, worsening fluid overload Agree with IV Lasix infusion. Agree with continuing Aldactone - Discussed low-salt diet with patient - Daily weights - Assess response of current therapy - Urinalysis is negative for protein. - Daily BMP  2. Renal transplant in June 2012 immunosuppression induction with Campath, now maintenance with tacrolimus and  mycophenolate.  1 month allograft biopsy July 2012- recovering ATN with early tubular atrophy, interstitial fibrosis Repeat biopsy March 2017 for increasing proteinuria- no acute rejection, showed diabetic, neuropathy, FSGS  - Continue home dose of immunosuppression Currently mycophenolate 360 mg twice a day Tacrolimus 1 mg twice a day  3. Hypokalemia - Spironolactone    LOS: 3 [facility] 8/23/201812:32 PM  [place], [place]

## 2016-11-14 NOTE — Progress Notes (Signed)
[place] for heparin Indication: atrial fibrillation   Pharmacy consulted to dose and monitor heparin drip in this 56 year old male for atrial fibrillation. Was taking apixaban PTA but is being converted to heparin drip. Patient currently receiving heparin at 1900 units/hr. APTT and Anti-Xa levels now correlate,   Goal of Therapy:  Heparin level 0.3-0.7 units/ml aPTT 66-102 seconds Monitor platelets by anticoagulation protocol: Yes   Plan:  Will bolus 1500 units x 1 and increase heparin rate to 2100 units/hr. Will recheck anti-Xa level at 2130.   Allergies  Allergen Reactions  . Morphine Itching    Patient Measurements: Height: _0  (180.3 cm) Weight: 294 lb 1.6 oz (133.4 kg) IBW/kg (Calculated) : 75.3 Heparin Dosing Weight: 106 kg  Vital Signs: Temp: 97.8 F (36.6 C) (08/23 1144) Temp Source: Oral (08/23 1144) BP: 136/70 (08/23 1144) Pulse Rate: 61 (08/23 1144)  Labs:  Recent Labs  11/11/16 1649  11/12/16 0417  11/13/16 0253 11/13/16 1118 11/13/16 1910 11/14/16 0218 11/14/16 0353 11/14/16 0728 11/14/16 1129 11/14/16 1350  HGB 11.1*  --  10.5*  --  10.5*  --   --   --   --   --   --   --   HCT 34.9*  --  32.5*  --  32.7*  --   --   --   --   --   --   --   PLT 155  --  128*  --  131*  --   --   --   --   --   --   --   APTT 44*  --  56*  < > 64* 58* 62* 69*  --   --   --   --   LABPROT 20.8*  --   --   --   --   --   --   --   --   --   --   --   INR 1.77  --   --   --   --   --   --   --   --   --   --   --   HEPARINUNFRC  --   < > 1.18*  --  0.54  --   --  0.31  --   --   --  0.24*  CREATININE 1.09  --  1.11  --  1.18  --   --   --   --   --   --  1.29*  CKTOTAL 79  --   --   --   --   --   --   --   --   --   --   --   TROPONINI  --   --   --   --   --   --   --   --  <0.03 <0.03 <0.03  --   < > = values in this interval not displayed.  Estimated Creatinine Clearance: 88 mL/min (A) (by C-G formula based on SCr of  1.29 mg/dL (H)).   Medical History: Past Medical History:  Diagnosis Date  . Allergy    seasonal  . Amnestic MCI (mild cognitive impairment with memory loss)   . Asthma   . Atrial fibrillation ([place])   . Atrial fibrillation ([place]) 2016  . Benign neoplasm of colon   . CAD (coronary artery disease) 2005   had CABG  . CHF (  congestive heart failure) ([place])   . Chronic venous stasis dermatitis of both lower extremities   . Cytomegaloviral disease ([place]) 2017  . Depression   . Diabetes mellitus   . Diverticulosis   . Dyspnea   . Erectile dysfunction   . ESRD (end stage renal disease) ([place]) 2005   Dialysis then renal transplant  . GERD (gastroesophageal reflux disease)   . Gout   . Hyperlipidemia    low since renal failure  . Hypertension   . Kidney transplant status, cadaveric 2012  . Obesity   . Pneumonia   . Purpura ([place])   . Sleep apnea    bipap with oxygen 2 l  . Trigger finger   . Ulcer 05/2016   Left shin  . Wears glasses     Pharmacy will continue to monitor and adjust per consult.   MLS 11/14/16  3:20 PM

## 2016-11-14 NOTE — Progress Notes (Signed)
Patient alert and oriented, vss, no complaints of pain during shift.  No acute events during shift.  Remains in afib with rate around 50-60.  Family at bedside.  Planning to have catheterization tomorrow AM.  Remains on heparin gtt and lasix gtt at this time.  Continue to monitor strict I&0s.

## 2016-11-14 NOTE — Progress Notes (Signed)
  Patient Name: [name] Date of Encounter: 11/14/2016  Primary Cardiologist: Ar[facility]  Hospital Problem List     Principal Problem:   Acute on chronic systolic CHF (congestive heart failure) (HCC) Active Problems:   CAD (coronary artery disease)   Hypertension   Hyperlipidemia   ESRD (end stage renal disease) (HCC)   Type 2 diabetes, controlled, with renal manifestation (HCC)   Chronic atrial fibrillation (HCC)   H/O kidney transplant   Chronic venous insufficiency     Subjective   Does not feel like he is making much progress. Documented UOP of 586 mL for the past 24 hours and 2.3 L for the admission. He feels like this is fairly accurate. bmet pending this morning. Weight 294 pounds (dry weight ~ 275 pounds). Had episode of chest pain overnight with nausea and dry heaving. Troponin negative. EKG not acute. No current chest pain.   Inpatient Medications    Scheduled Meds: . amLODipine  5 mg Oral Daily  . buPROPion  75 mg Oral BID  . furosemide  60 mg Intravenous BID  . insulin aspart  0-5 Units Subcutaneous QHS  . insulin aspart  0-9 Units Subcutaneous TID WC  . insulin glargine  10 Units Subcutaneous QHS  . lisinopril  40 mg Oral Daily  . multivitamin with minerals  1 tablet Oral Daily  . mycophenolate  360 mg Oral BID  . pantoprazole  40 mg Oral Daily  . potassium chloride  40 mEq Oral BID  . rosuvastatin  10 mg Oral Daily  . sodium chloride flush  3 mL Intravenous Q12H  . spironolactone  25 mg Oral Daily  . sulfamethoxazole-trimethoprim  1 tablet Oral Once per day on Mon Wed Fri  . tacrolimus  1 mg Oral BID   Continuous Infusions: . heparin 1,900 Units/hr (11/14/16 0151)   PRN Meds: acetaminophen **OR** acetaminophen, fluticasone, nitroGLYCERIN, ondansetron **OR** ondansetron (ZOFRAN) IV, oxyCODONE-acetaminophen   Vital Signs    Vitals:   11/14/16 0321 11/14/16 0331 11/14/16 0337 11/14/16 0353  BP: 128/69  139/64 139/64  Pulse: (!) 49 61 (!) 56 (!)  56  Resp: 19     Temp: 98 F (36.7 C)     TempSrc:      SpO2: 97%     Weight: 294 lb 1.6 oz (133.4 kg)     Height:        Intake/Output Summary (Last 24 hours) at 11/14/16 0834 Last data filed at 11/14/16 0400  Gross per 24 hour  Intake           641.32 ml  Output             1210 ml  Net          -568.68 ml   Filed Weights   11/12/16 0402 11/13/16 0454 11/14/16 0321  Weight: (!) 300 lb 14.4 oz (136.5 kg) 296 lb 8 oz (134.5 kg) 294 lb 1.6 oz (133.4 kg)    Physical Exam    GEN: Well nourished, well developed, in no acute distress.  HEENT: Grossly normal.  Neck: Supple, no JVD, carotid bruits, or masses. Cardiac: Irregularly irregular, no murmurs, rubs, or gallops. No clubbing, cyanosis, 1+ bilateral pitting edema.  Radials/DP/PT 2+ and equal bilaterally.  Respiratory:  Bibasilar crackles bilaterally. GI: Soft, nontender, distended, BS + x 4. MS: no deformity or atrophy. Skin: warm and dry, no rash. Neuro:  Strength and sensation are intact. Psych: AAOx3.  Normal affect.  Labs      CBC  Recent Labs  11/12/16 0417 11/13/16 0253  WBC 4.2 4.0  HGB 10.5* 10.5*  HCT 32.5* 32.7*  MCV 72.3* 71.5*  PLT 128* 131*   Basic Metabolic Panel  Recent Labs  11/12/16 0417 11/13/16 0253  NA 143 139  K 3.5 3.4*  CL 109 106  CO2 29 28  GLUCOSE 105* 83  BUN 22* 23*  CREATININE 1.11 1.18  CALCIUM 8.8* 8.6*   Liver Function Tests  Recent Labs  11/11/16 1649  AST 27  ALT 13*  ALKPHOS 125  BILITOT 1.2  PROT 8.2*  ALBUMIN 3.8   No results for input(s): LIPASE, AMYLASE in the last 72 hours. Cardiac Enzymes  Recent Labs  11/11/16 1649 11/14/16 0353 11/14/16 0728  CKTOTAL 79  --   --   TROPONINI  --  <0.03 <0.03   BNP Invalid input(s): POCBNP D-Dimer No results for input(s): DDIMER in the last 72 hours. Hemoglobin A1C No results for input(s): HGBA1C in the last 72 hours. Fasting Lipid Panel No results for input(s): CHOL, HDL, LDLCALC, TRIG, CHOLHDL,  LDLDIRECT in the last 72 hours. Thyroid Function Tests  Recent Labs  11/11/16 1649  TSH 2.513    Telemetry    Afib - Personally Reviewed  ECG    n/a - Personally Reviewed  Radiology    No results found.  Cardiac Studies   TTE 10/16/2016: Study Conclusions  - Left ventricle: The cavity size was normal. There was moderate concentric hypertrophy. Systolic function was mildly reduced. The estimated ejection fraction was in the range of 45% to 50%. The study is not technically sufficient to allow evaluation of LV diastolic function. - Left atrium: The atrium was moderately dilated. - Right ventricle: Systolic function was normal. - Pulmonary arteries: Systolic pressure was within the normal range.  Impressions:  - Very challenging image quality.  Patient Profile     56 y.o. male with history of CAD s/p CABG in 2004 with no recurrent ischemic events since, chronic systolic CHF/ICM with prior EF of 35-40% now improved to 45% by TTE in 09/2016, chronic Afib on Eliquis with prior significant bradycardia on small dose Coreg leading to discontinuation, ESRD previously on HD from 2004-2012 now s/p kidney transplant, anemia of chronic disease, DM2, HTN, HLD, obesity s/p gastric bypass in 11/2013 without complications, and OSA on CPAP who was directly admitted from our office on 8/20 with volume overload.  Assessment & Plan    1. Acute on chronic systolic CHF with RV failure: -Feels like he is not making much progress -He continues to appear volume overloaded, dry weight ~ 275 pounds -Discussed with renal, will start Lasix infusion -Continue KCl repletion -If minimal urine output is noted on Lasix infusion consider right heart catheterization on 8/24 -If patient has adequate urine output on Lasix infusion would continued throughout the weekend with possible right heart catheterization on 8/27 -No indication to repeat echo given recent study from 09/2016 -Not on beta  blocker given prior issue with significant bradycardia with Coreg -Continue lisinopril and spironolactone given his cardiomyopathy -Daily weights, strict Is and Os  2. Chronic Afib: -Rates currently well controlled -Asymptomatic   -Not on beta blocker given bradycardia -Eliquis on hold given plans for RHC prior to discharge -Heparin gtt -Less likely his Afib is playing a role in his CHF  3. CAD: -Without angina -Continue current medical therapy  4. ESRD s/p renal transplant: -Nephrology on board -Renal function stable   5. HLD: -Statin  Signed, Ryan   Dunn, [place] Pager: [phone] 11/14/2016, 8:34 AM   Attending Note Patient seen and examined, agree with detailed note above,  Patient presentation and plan discussed on rounds.  EKG lab work, chest x-ray, echocardiogram reviewed independently by myself  Improved breathing, cough is improved still with  shortness of breath, abdominal bloating Leg swelling persists but improving  on Lasix 60 IV twice a day but did not make much urine,  Neg 5-600 only  On exam, unable to estimate JVP, lungs with dullness to bases otherwise clear, heart sounds regular with normal S1-S2 no murmurs appreciated, abdomen obese soft nontender, trace minimally pitting lower extremity edema  Lab work showing creatinine 1.29, BUN 25  A/P 1) acute on chronic systolic CHF Case discussed with hospitalist service, would consider Lasix infusion given decreased urine output Continue lisinopril, Aldactone If urine output continues to run low on Lasix infusion, consider right heart catheterization tomorrow  2) chronic atrial fibrillation On heparin, rate well controlled Not on beta blocker secondary to bradycardia  3)CAD Currently with no symptoms of angina. No further workup at this time. Continue current medication regimen.  4) end-stage renal disease, history of transplant Nephrology following, monitor BMP  daily  Greater than 50% was spent in counseling and coordination of care with patient Total encounter time 25 minutes or more   Signed: Esmond Plants  M.D., Ph.D. [facility]

## 2016-11-14 NOTE — Consult Note (Signed)
Rounded on Mr. Smithey.  Patient now on lasix gtt and heparin gtt.  Patient is hopeful lasix gtt will help get some of the fluid off.  Patient stated when he was at [facility] the lasix gtt took about 8 hours before fluid started coming off.  Patient is scheduled for cardiac cath tomorrow.  Patient stated he does not have any questions that I can help him with at this time.  Will round on patient tomorrow.    Roanna Epley, RN, BSN, [facility]  Cardiovascular and Pulmonary Nurse Navigator 12:15 - 12:25

## 2016-11-15 ENCOUNTER — Encounter
Admission: AD | Disposition: A | Discharge status: Home / Self Care | Payer: Self-pay | Source: Ambulatory Visit | Attending: Internal Medicine

## 2016-11-15 ENCOUNTER — Encounter: Payer: Self-pay | Admitting: Cardiovascular Disease

## 2016-11-15 HISTORY — PX: RIGHT HEART CATH: CATH118263

## 2016-11-15 LAB — BASIC METABOLIC PANEL
ANION GAP: 9 (ref 5–15)
Anion gap: 5 (ref 5–15)
BUN: 28 mg/dL — ABNORMAL HIGH (ref 6–20)
BUN: 29 mg/dL — ABNORMAL HIGH (ref 6–20)
CALCIUM: 9.4 mg/dL (ref 8.9–10.3)
CHLORIDE: 103 mmol/L (ref 101–111)
CHLORIDE: 99 mmol/L — AB (ref 101–111)
CO2: 32 mmol/L (ref 22–32)
CO2: 32 mmol/L (ref 22–32)
CREATININE: 1.52 mg/dL — AB (ref 0.61–1.24)
Calcium: 9.1 mg/dL (ref 8.9–10.3)
Creatinine, Ser: 1.44 mg/dL — ABNORMAL HIGH (ref 0.61–1.24)
GFR calc non Af Amer: 49 mL/min — ABNORMAL LOW (ref >60)
GFR calc non Af Amer: 53 mL/min — ABNORMAL LOW (ref >60)
GFR, EST AFRICAN AMERICAN: 57 mL/min — AB (ref >60)
GLUCOSE: 91 mg/dL (ref 65–99)
Glucose, Bld: 191 mg/dL — ABNORMAL HIGH (ref 65–99)
Potassium: 4.1 mmol/L (ref 3.5–5.1)
Potassium: 3.9 mmol/L (ref 3.5–5.1)
Sodium: 140 mmol/L (ref 135–145)
Sodium: 140 mmol/L (ref 135–145)

## 2016-11-15 LAB — CBC
HCT: 32.6 % — ABNORMAL LOW (ref 40.0–52.0)
Hemoglobin: 10.6 g/dL — ABNORMAL LOW (ref 13.0–18.0)
MCH: 23.5 pg — AB (ref 26.0–34.0)
MCHC: 32.4 g/dL (ref 32.0–36.0)
MCV: 72.5 fL — AB (ref 80.0–100.0)
PLATELETS: 138 10*3/uL — AB (ref 150–440)
RBC: 4.5 MIL/uL (ref 4.40–5.90)
RDW: 17.8 % — ABNORMAL HIGH (ref 11.5–14.5)
WBC: 4.5 10*3/uL (ref 3.8–10.6)

## 2016-11-15 LAB — GLUCOSE, CAPILLARY
Glucose-Capillary: 79 mg/dL (ref 65–99)
Glucose-Capillary: 77 mg/dL (ref 65–99)
Glucose-Capillary: 101 mg/dL — ABNORMAL HIGH (ref 65–99)
Glucose-Capillary: 85 mg/dL (ref 65–99)
Glucose-Capillary: 95 mg/dL (ref 65–99)

## 2016-11-15 LAB — HEPARIN LEVEL (UNFRACTIONATED): Heparin Unfractionated: 0.31 IU/mL (ref 0.30–0.70)

## 2016-11-15 SURGERY — RIGHT HEART CATH
Anesthesia: Moderate Sedation

## 2016-11-15 MED ORDER — SODIUM CHLORIDE 0.9% FLUSH
3.0000 mL | Freq: Two times a day (BID) | INTRAVENOUS | Status: DC
  Administered 2016-11-15 – 2016-11-16 (×2): 3 mL via INTRAVENOUS

## 2016-11-15 MED ORDER — LIDOCAINE HCL (PF) 1 % IJ SOLN
INTRAMUSCULAR | Status: DC | PRN
  Administered 2016-11-15: 8 mL via INTRADERMAL

## 2016-11-15 MED ORDER — METOLAZONE 5 MG PO TABS
5.0000 mg | ORAL_TABLET | Freq: Once | ORAL | Status: AC
  Administered 2016-11-15: 5 mg via ORAL
  Filled 2016-11-15: qty 1

## 2016-11-15 MED ORDER — FENTANYL CITRATE (PF) 100 MCG/2ML IJ SOLN
INTRAMUSCULAR | Status: AC
  Filled 2016-11-15: qty 2

## 2016-11-15 MED ORDER — SODIUM CHLORIDE 0.9% FLUSH: 3.0000 mL | INTRAVENOUS | Status: DC | PRN

## 2016-11-15 MED ORDER — HEPARIN (PORCINE) IN NACL 2-0.9 UNIT/ML-% IJ SOLN
INTRAMUSCULAR | Status: AC
  Filled 2016-11-15: qty 500

## 2016-11-15 MED ORDER — FENTANYL CITRATE (PF) 100 MCG/2ML IJ SOLN
INTRAMUSCULAR | Status: DC | PRN
  Administered 2016-11-15: 25 ug via INTRAVENOUS

## 2016-11-15 MED ORDER — DEXTROSE 5 % IV SOLN
10.0000 mg/h | INTRAVENOUS | Status: DC
  Administered 2016-11-15: 10 mg/h via INTRAVENOUS
  Filled 2016-11-15: qty 25

## 2016-11-15 MED ORDER — APIXABAN 5 MG PO TABS
5.0000 mg | ORAL_TABLET | Freq: Two times a day (BID) | ORAL | Status: DC
  Administered 2016-11-15 – 2016-11-16 (×2): 5 mg via ORAL
  Filled 2016-11-15 (×4): qty 1

## 2016-11-15 MED ORDER — LIDOCAINE HCL (PF) 1 % IJ SOLN
INTRAMUSCULAR | Status: AC
  Filled 2016-11-15: qty 30

## 2016-11-15 MED ORDER — MIDAZOLAM HCL 2 MG/2ML IJ SOLN
INTRAMUSCULAR | Status: AC
  Filled 2016-11-15: qty 2

## 2016-11-15 MED ORDER — MIDAZOLAM HCL 2 MG/2ML IJ SOLN
INTRAMUSCULAR | Status: DC | PRN
  Administered 2016-11-15: 1 mg via INTRAVENOUS

## 2016-11-15 MED ORDER — SODIUM CHLORIDE 0.9 % IV SOLN: 250.0000 mL | INTRAVENOUS | Status: DC | PRN

## 2016-11-15 SURGICAL SUPPLY — 9 items
CATH SWANZ 7F THERMO (CATHETERS) ×2 IMPLANT
GLIDESHEATH SLEND SS 6F .021 (SHEATH) IMPLANT
GUIDEWIRE EMER 3M J .025X150CM (WIRE) ×2 IMPLANT
KIT MANI 3VAL PERCEP (MISCELLANEOUS) ×2 IMPLANT
KIT RIGHT HEART (MISCELLANEOUS) IMPLANT
NEEDLE PERC 18GX7CM (NEEDLE) ×2 IMPLANT
PACK CARDIAC CATH (CUSTOM PROCEDURE TRAY) ×2 IMPLANT
SHEATH AVANTI 5FR X 11CM (SHEATH) IMPLANT
SHEATH PINNACLE 7F 10CM (SHEATH) ×2 IMPLANT

## 2016-11-15 NOTE — Interval H&P Note (Signed)
History and Physical Interval Note:  11/15/2016 8:07 AM  [name]  has presented today for surgery, with the diagnosis of acute on chronic diastolic chf  The various methods of treatment have been discussed with the patient and family. After consideration of risks, benefits and other options for treatment, the patient has consented to  Procedure(s): RIGHT HEART CATH (N/A) as a surgical intervention .  The patient's history has been reviewed, patient examined, no change in status, stable for surgery.  I have reviewed the patient's chart and labs.  Questions were answered to the patient's satisfaction.     [name]

## 2016-11-15 NOTE — Progress Notes (Signed)
Patient Name: [name] Date of Encounter: 11/15/2016  Primary Cardiologist: [facility] Problem List     Principal Problem:   Acute on chronic systolic CHF (congestive heart failure) (HCC) Active Problems:   CAD (coronary artery disease)   Hypertension   Hyperlipidemia   ESRD (end stage renal disease) (HCC)   Type 2 diabetes, controlled, with renal manifestation (HCC)   Chronic atrial fibrillation ([place])   H/O kidney transplant   Chronic venous insufficiency     Subjective   He is -7 L but his weight did not go down significantly. Right heart catheterization was done today which showed severely elevated filling pressures, severe pulmonary hypertension and low normal cardiac output.  Inpatient Medications    Scheduled Meds: . [MAR Hold] amLODipine  5 mg Oral Daily  . apixaban  5 mg Oral BID  . [MAR Hold] buPROPion  75 mg Oral BID  . [MAR Hold] insulin aspart  0-5 Units Subcutaneous QHS  . [MAR Hold] insulin aspart  0-9 Units Subcutaneous TID WC  . [MAR Hold] insulin glargine  10 Units Subcutaneous QHS  . [MAR Hold] lisinopril  40 mg Oral Daily  . [MAR Hold] multivitamin with minerals  1 tablet Oral Daily  . [MAR Hold] mycophenolate  360 mg Oral BID  . [MAR Hold] pantoprazole  40 mg Oral Daily  . [MAR Hold] potassium chloride  40 mEq Oral BID  . [MAR Hold] rosuvastatin  10 mg Oral Daily  . [MAR Hold] sodium chloride flush  3 mL Intravenous Q12H  . sodium chloride flush  3 mL Intravenous Q12H  . sodium chloride flush  3 mL Intravenous Q12H  . [MAR Hold] spironolactone  25 mg Oral Daily  . [MAR Hold] sulfamethoxazole-trimethoprim  1 tablet Oral Once per day on Mon Wed Fri  . [MAR Hold] tacrolimus  1 mg Oral BID   Continuous Infusions: . sodium chloride    . sodium chloride    . furosemide (LASIX) infusion 10 mg/hr (11/15/16 0856)  . heparin 2,100 Units/hr (11/14/16 1550)   PRN Meds: sodium chloride, sodium chloride, [MAR Hold] acetaminophen **OR** [MAR  Hold] acetaminophen, [MAR Hold] fluticasone, [MAR Hold] nitroGLYCERIN, [MAR Hold] ondansetron **OR** [MAR Hold] ondansetron (ZOFRAN) IV, [MAR Hold] oxyCODONE-acetaminophen, sodium chloride flush, sodium chloride flush   Vital Signs    Vitals:   11/15/16 0920 11/15/16 0925 11/15/16 0930 11/15/16 0935  BP:   (!) 143/73   Pulse: (!) 58 62 65 69  Resp: _0 Temp:      TempSrc:      SpO2: 95% 93% 93% 93%  Weight:      Height:        Intake/Output Summary (Last 24 hours) at 11/15/16 0951 Last data filed at 11/15/16 0900  Gross per 24 hour  Intake           790.69 ml  Output             2955 ml  Net         -2164.31 ml   Filed Weights   11/13/16 0454 11/14/16 0321 11/15/16 0406  Weight: 296 lb 8 oz (134.5 kg) 294 lb 1.6 oz (133.4 kg) (!) 300 lb 14.4 oz (136.5 kg)    Physical Exam    GEN: Well nourished, well developed, in no acute distress.  HEENT: Grossly normal.  Neck: Supple, no JVD, carotid bruits, or masses. Cardiac: Irregularly irregular, no murmurs, rubs, or gallops. No clubbing, cyanosis,  1+ bilateral pitting edema.  Radials/DP/PT 2+ and equal bilaterally.  Respiratory:  Bibasilar crackles bilaterally. GI: Soft, nontender, distended, BS + x 4. MS: no deformity or atrophy. Skin: warm and dry, no rash. Neuro:  Strength and sensation are intact. Psych: AAOx3.  Normal affect.  Labs    CBC  Recent Labs  11/13/16 0253 11/15/16 0514  WBC 4.0 4.5  HGB 10.5* 10.6*  HCT 32.7* 32.6*  MCV 71.5* 72.5*  PLT 131* 527*   Basic Metabolic Panel  Recent Labs  11/14/16 1350 11/15/16 0514  NA 139 140  K 4.4 4.1  CL 106 103  CO2 28 32  GLUCOSE 187* 91  BUN 25* 28*  CREATININE 1.29* 1.52*  CALCIUM 8.9 9.1   Liver Function Tests No results for input(s): AST, ALT, ALKPHOS, BILITOT, PROT, ALBUMIN in the last 72 hours. No results for input(s): LIPASE, AMYLASE in the last 72 hours. Cardiac Enzymes  Recent Labs  11/14/16 0353 11/14/16 0728 11/14/16 1129    TROPONINI <0.03 <0.03 <0.03   BNP Invalid input(s): POCBNP D-Dimer No results for input(s): DDIMER in the last 72 hours. Hemoglobin A1C No results for input(s): HGBA1C in the last 72 hours. Fasting Lipid Panel No results for input(s): CHOL, HDL, LDLCALC, TRIG, CHOLHDL, LDLDIRECT in the last 72 hours. Thyroid Function Tests No results for input(s): TSH, T4TOTAL, T3FREE, THYROIDAB in the last 72 hours.  Invalid input(s): FREET3  Telemetry    Afib - Personally Reviewed  ECG    n/a - Personally Reviewed  Radiology    No results found.  Cardiac Studies   TTE 10/16/2016: Study Conclusions  - Left ventricle: The cavity size was normal. There was moderate concentric hypertrophy. Systolic function was mildly reduced. The estimated ejection fraction was in the range of 45% to 50%. The study is not technically sufficient to allow evaluation of LV diastolic function. - Left atrium: The atrium was moderately dilated. - Right ventricle: Systolic function was normal. - Pulmonary arteries: Systolic pressure was within the normal range.  Impressions:  - Very challenging image quality.  Patient Profile     56 y.o. male with history of CAD s/p CABG in 2004 with no recurrent ischemic events since, chronic systolic CHF/ICM with prior EF of 35-40% now improved to 45% by TTE in 09/2016, chronic Afib on Eliquis with prior significant bradycardia on small dose Coreg leading to discontinuation, ESRD previously on HD from 2004-2012 now s/p kidney transplant, anemia of chronic disease, DM2, HTN, HLD, obesity s/p gastric bypass in 09/8240 without complications, and OSA on CPAP who was directly admitted from our office on 8/20 with volume overload.  Assessment & Plan    1. Acute on chronic systolic CHF with RV failure: --Not on beta blocker given prior issue with significant bradycardia with Coreg -Continue lisinopril and spironolactone given his cardiomyopathy - his dry weight  is around 275 pounds. Right heart catheterization today showed severely elevated filling pressures and severe pulmonary hypertension in spite of multiple days of diuresis. Pulmonary capillary wedge pressure was 31 mmHg. I increased furosemide drip to 10 mg per hour. I'm going to add metolazone to enhance diuresis but we will have to monitor renal function closely given his prior kidney transplant. His cardiac output was close to normal and thus inotropes might not be very helpful in this situation.  2. Chronic Afib: -Rates currently well controlled -Asymptomatic   -Not on beta blocker given bradycardia - resume Eliquis this evening.  3. CAD: -Without angina -Continue current  medical therapy  4. ESRD s/p renal transplant: -Nephrology on board - slight worsening of renal function. Continue to monitor closely.  5. HLD: -Statin  Signed, [name], MD  [facility] 11/15/2016, 9:51 AM

## 2016-11-15 NOTE — Care Management (Addendum)
Cardiac cath showed significant pulmonary hypertension.  There is documentation that patient may benefit from [facility] referral.  Remains on lasix drip. [facility] referral consideration

## 2016-11-15 NOTE — Progress Notes (Addendum)
[name] for heparin Indication: atrial fibrillation   Pharmacy consulted to dose and monitor heparin drip in this 56 year old male for atrial fibrillation. Was taking apixaban PTA but is being converted to heparin drip. Patient currently receiving heparin at 1900 units/hr. APTT and Anti-Xa levels now correlate,   Goal of Therapy:  Heparin level 0.3-0.7 units/ml aPTT 66-102 seconds Monitor platelets by anticoagulation protocol: Yes   Plan:  Will bolus 1500 units x 1 and increase heparin rate to 2100 units/hr. Will recheck anti-Xa level at 2130.  8/23 2130 heparin level 0.38. Continue current regimen. Check confirmatory heparin level and CBC with tomorrow AM labs.   8/24 0500 heparin level 0.31. Continue current regimen. Recheck heparin level and CBC with tomorrow AM labs.   Allergies  Allergen Reactions  . Morphine Itching    Patient Measurements: Height: 5' 11" (180.3 cm) Weight: 294 lb 1.6 oz (133.4 kg) IBW/kg (Calculated) : 75.3 Heparin Dosing Weight: 106 kg  Vital Signs: Temp: 98.2 F (36.8 C) (08/23 1936) Temp Source: Oral (08/23 1936) BP: 116/56 (08/23 1936) Pulse Rate: 56 (08/23 1936)  Labs:  Recent Labs  11/12/16 0417  11/13/16 0253 11/13/16 1118 11/13/16 1910 11/14/16 0218 11/14/16 0353 11/14/16 0728 11/14/16 1129 11/14/16 1350 11/14/16 2130  HGB 10.5*  --  10.5*  --   --   --   --   --   --   --   --   HCT 32.5*  --  32.7*  --   --   --   --   --   --   --   --   PLT 128*  --  131*  --   --   --   --   --   --   --   --   APTT 56*  < > 64* 58* 62* 69*  --   --   --   --   --   HEPARINUNFRC 1.18*  --  0.54  --   --  0.31  --   --   --  0.24* 0.38  CREATININE 1.11  --  1.18  --   --   --   --   --   --  1.29*  --   TROPONINI  --   --   --   --   --   --  <0.03 <0.03 <0.03  --   --   < > = values in this interval not displayed.  Estimated Creatinine Clearance: 88 mL/min (A) (by C-G formula based on SCr of 1.29  mg/dL (H)).   Medical History: Past Medical History:  Diagnosis Date  . Allergy    seasonal  . Amnestic MCI (mild cognitive impairment with memory loss)   . Asthma   . Atrial fibrillation ([place])   . Atrial fibrillation ([place]) 2016  . Benign neoplasm of colon   . CAD (coronary artery disease) 2005   had CABG  . CHF (congestive heart failure) ([place])   . Chronic venous stasis dermatitis of both lower extremities   . Cytomegaloviral disease ([place]) 2017  . Depression   . Diabetes mellitus   . Diverticulosis   . Dyspnea   . Erectile dysfunction   . ESRD (end stage renal disease) ([place]) 2005   Dialysis then renal transplant  . GERD (gastroesophageal reflux disease)   . Gout   . Hyperlipidemia    low since renal failure  . Hypertension   .  Kidney transplant status, cadaveric 2012  . Obesity   . Pneumonia   . Purpura ([place])   . Sleep apnea    bipap with oxygen 2 l  . Trigger finger   . Ulcer 05/2016   Left shin  . Wears glasses     Pharmacy will continue to monitor and adjust per consult.   Sim Boast, PharmD, BCPS  11/15/16 6:28 AM

## 2016-11-15 NOTE — Progress Notes (Signed)
[name] [place] NAME: [name]    MR#:  [id number]  DATE OF BIRTH:  1960/04/23  SUBJECTIVE:  Came in with increasing shortness of breath and weight gain found to have CHF Patient feels a whole lot better. He had 1800 cc urine output yesterday   REVIEW OF SYSTEMS:   Review of Systems  Constitutional: Negative for chills, fever and weight loss.  HENT: Negative for ear discharge, ear pain and nosebleeds.   Eyes: Negative for blurred vision, pain and discharge.  Respiratory: Positive for shortness of breath. Negative for sputum production, wheezing and stridor.   Cardiovascular: Positive for orthopnea and leg swelling. Negative for chest pain, palpitations and PND.  Gastrointestinal: Negative for abdominal pain, diarrhea, nausea and vomiting.  Genitourinary: Negative for frequency and urgency.  Musculoskeletal: Negative for back pain and joint pain.  Neurological: Positive for weakness. Negative for sensory change, speech change and focal weakness.  Psychiatric/Behavioral: Negative for depression and hallucinations. The patient is not nervous/anxious.    Tolerating Diet:yes Tolerating PT: not needed  DRUG ALLERGIES:   Allergies  Allergen Reactions  . Morphine Itching    VITALS:  Blood pressure (!) 156/71, pulse 63, temperature 98 F (36.7 C), temperature source Oral, resp. rate 18, height 5' 11" (1.803 m), weight (!) 136.5 kg (300 lb 14.4 oz), SpO2 96 %.  PHYSICAL EXAMINATION:   Physical Exam  GENERAL:  56 y.o.-year-old patient lying in the bed with no acute distress.  EYES: Pupils equal, round, reactive to light and accommodation. No scleral icterus. Extraocular muscles intact.  HEENT: Head atraumatic, normocephalic. Oropharynx and nasopharynx clear.  NECK:  Supple, no jugular venous distention. No thyroid enlargement, no tenderness.  LUNGS: Normal breath sounds bilaterally, no wheezing, rales, rhonchi. No use of  accessory muscles of respiration.  CARDIOVASCULAR: S1, S2 normal. No murmurs, rubs, or gallops.  ABDOMEN: Soft, nontender, nondistended. Bowel sounds present. No organomegaly or mass.  EXTREMITIES: No cyanosis, clubbing ,++ edema b/l.    NEUROLOGIC: Cranial nerves II through XII are intact. No focal Motor or sensory deficits b/l.   PSYCHIATRIC:  patient is alert and oriented x 3.  SKIN: No obvious rash, lesion, or ulcer.   LABORATORY PANEL:  CBC  Recent Labs Lab 11/15/16 0514  WBC 4.5  HGB 10.6*  HCT 32.6*  PLT 138*    Chemistries   Recent Labs Lab 11/11/16 1649  11/15/16 1437  NA 141  < > 140  K 3.7  < > 3.9  CL 105  < > 99*  CO2 29  < > 32  GLUCOSE 91  < > 191*  BUN 21*  < > 29*  CREATININE 1.09  < > 1.44*  CALCIUM 9.2  < > 9.4  AST 27  --   --   ALT 13*  --   --   ALKPHOS 125  --   --   BILITOT 1.2  --   --   < > = values in this interval not displayed. Cardiac Enzymes  Recent Labs Lab 11/14/16 1129  TROPONINI <0.03   RADIOLOGY:  No results found. ASSESSMENT AND PLAN:  [name]  is a 56 y.o. male with a known history of renal transplant, diabetes, coronary artery disease, congestive heart failure and atrial fibrillation. He has been gaining weight over the last few weeks. He has met with his transplant team and cardiology team. He has gained 22 pounds. His diuretics haven't been working.  1. Acute Systolic Congestive heart Failure with 22 pound weight gain. -was on Lasix 40 mg IV twice a day. Daily weights. Strict ins and outs. -Patient still feels significantly volume overloaded. Cardiology recommends to see if he can be given IV Lasix drip. -s/p RHC showed increased right sided pressures and wedge pressure -Added metolazone for aggressive diuresis  2. Atrial fibrillation rate controlled.hold Eliquis since cardiology is planning a cardiac catheterization.  -  Now off  IVheparin drip - resuemd eliquis  3. History of renal transplant - continue  immunosuppressive's.  4. Essential hypertension - resume home meds   5. Hyperlipidemia -On  Crestor  6. GERD on omeprazole  7. Type 2 diabetes mellitus.  -Sliding scale insulin and lower dose Lantus 10 units  Case discussed with Care Management/Social Worker. Management plans discussed with the patient, family and they are in agreement.  CODE STATUS: Full  DVT Prophylaxis: eliquis  TOTAL TIME TAKING CARE OF THIS PATIENT: 30 minutes.  >50% time spent on counselling and coordination of care patient and wife  POSSIBLE D/C IN 1-2* DAYS, DEPENDING ON CLINICAL CONDITION.  Note: This dictation was prepared with Dragon dictation along with smaller phrase technology. Any transcriptional errors that result from this process are unintentional.  [name] M.D on 11/15/2016 at 4:25 PM  Between 7am to 6pm - Pager - [phone]  After 6pm go to [email or web address] - password [email or web address] [facility]  Office  [phone]  CC: Primary care physician; [name], MD

## 2016-11-15 NOTE — H&P (View-Only) (Signed)
Patient Name: [name] Date of Encounter: 11/14/2016  Primary Cardiologist: [facility] Problem List     Principal Problem:   Acute on chronic systolic CHF (congestive heart failure) (HCC) Active Problems:   CAD (coronary artery disease)   Hypertension   Hyperlipidemia   ESRD (end stage renal disease) (HCC)   Type 2 diabetes, controlled, with renal manifestation (HCC)   Chronic atrial fibrillation (HCC)   H/O kidney transplant   Chronic venous insufficiency     Subjective   Does not feel like he is making much progress. Documented UOP of 586 mL for the past 24 hours and 2.3 L for the admission. He feels like this is fairly accurate. bmet pending this morning. Weight 294 pounds (dry weight ~ 275 pounds). Had episode of chest pain overnight with nausea and dry heaving. Troponin negative. EKG not acute. No current chest pain.   Inpatient Medications    Scheduled Meds: . amLODipine  5 mg Oral Daily  . buPROPion  75 mg Oral BID  . furosemide  60 mg Intravenous BID  . insulin aspart  0-5 Units Subcutaneous QHS  . insulin aspart  0-9 Units Subcutaneous TID WC  . insulin glargine  10 Units Subcutaneous QHS  . lisinopril  40 mg Oral Daily  . multivitamin with minerals  1 tablet Oral Daily  . mycophenolate  360 mg Oral BID  . pantoprazole  40 mg Oral Daily  . potassium chloride  40 mEq Oral BID  . rosuvastatin  10 mg Oral Daily  . sodium chloride flush  3 mL Intravenous Q12H  . spironolactone  25 mg Oral Daily  . sulfamethoxazole-trimethoprim  1 tablet Oral Once per day on Mon Wed Fri  . tacrolimus  1 mg Oral BID   Continuous Infusions: . heparin 1,900 Units/hr (11/14/16 0151)   PRN Meds: acetaminophen **OR** acetaminophen, fluticasone, nitroGLYCERIN, ondansetron **OR** ondansetron (ZOFRAN) IV, oxyCODONE-acetaminophen   Vital Signs    Vitals:   11/14/16 0321 11/14/16 0331 11/14/16 0337 11/14/16 0353  BP: 128/69  139/64 139/64  Pulse: (!) 49 61 (!) 56 (!)  56  Resp: 19     Temp: 98 F (36.7 C)     TempSrc:      SpO2: 97%     Weight: 294 lb 1.6 oz (133.4 kg)     Height:        Intake/Output Summary (Last 24 hours) at 11/14/16 0834 Last data filed at 11/14/16 0400  Gross per 24 hour  Intake           641.32 ml  Output             1210 ml  Net          -568.68 ml   Filed Weights   11/12/16 0402 11/13/16 0454 11/14/16 0321  Weight: (!) 300 lb 14.4 oz (136.5 kg) 296 lb 8 oz (134.5 kg) 294 lb 1.6 oz (133.4 kg)    Physical Exam    GEN: Well nourished, well developed, in no acute distress.  HEENT: Grossly normal.  Neck: Supple, no JVD, carotid bruits, or masses. Cardiac: Irregularly irregular, no murmurs, rubs, or gallops. No clubbing, cyanosis, 1+ bilateral pitting edema.  Radials/DP/PT 2+ and equal bilaterally.  Respiratory:  Bibasilar crackles bilaterally. GI: Soft, nontender, distended, BS + x 4. MS: no deformity or atrophy. Skin: warm and dry, no rash. Neuro:  Strength and sensation are intact. Psych: AAOx3.  Normal affect.  Labs  CBC  Recent Labs  11/12/16 0417 11/13/16 0253  WBC 4.2 4.0  HGB 10.5* 10.5*  HCT 32.5* 32.7*  MCV 72.3* 71.5*  PLT 128* 478*   Basic Metabolic Panel  Recent Labs  11/12/16 0417 11/13/16 0253  NA 143 139  K 3.5 3.4*  CL 109 106  CO2 29 28  GLUCOSE 105* 83  BUN 22* 23*  CREATININE 1.11 1.18  CALCIUM 8.8* 8.6*   Liver Function Tests  Recent Labs  11/11/16 1649  AST 27  ALT 13*  ALKPHOS 125  BILITOT 1.2  PROT 8.2*  ALBUMIN 3.8   No results for input(s): LIPASE, AMYLASE in the last 72 hours. Cardiac Enzymes  Recent Labs  11/11/16 1649 11/14/16 0353 11/14/16 0728  CKTOTAL 79  --   --   TROPONINI  --  <0.03 <0.03   BNP Invalid input(s): POCBNP D-Dimer No results for input(s): DDIMER in the last 72 hours. Hemoglobin A1C No results for input(s): HGBA1C in the last 72 hours. Fasting Lipid Panel No results for input(s): CHOL, HDL, LDLCALC, TRIG, CHOLHDL,  LDLDIRECT in the last 72 hours. Thyroid Function Tests  Recent Labs  11/11/16 1649  TSH 2.513    Telemetry    Afib - Personally Reviewed  ECG    n/a - Personally Reviewed  Radiology    No results found.  Cardiac Studies   TTE 10/16/2016: Study Conclusions  - Left ventricle: The cavity size was normal. There was moderate concentric hypertrophy. Systolic function was mildly reduced. The estimated ejection fraction was in the range of 45% to 50%. The study is not technically sufficient to allow evaluation of LV diastolic function. - Left atrium: The atrium was moderately dilated. - Right ventricle: Systolic function was normal. - Pulmonary arteries: Systolic pressure was within the normal range.  Impressions:  - Very challenging image quality.  Patient Profile     56 y.o. male with history of CAD s/p CABG in 2004 with no recurrent ischemic events since, chronic systolic CHF/ICM with prior EF of 35-40% now improved to 45% by TTE in 09/2016, chronic Afib on Eliquis with prior significant bradycardia on small dose Coreg leading to discontinuation, ESRD previously on HD from 2004-2012 now s/p kidney transplant, anemia of chronic disease, DM2, HTN, HLD, obesity s/p gastric bypass in 04/9560 without complications, and OSA on CPAP who was directly admitted from our office on 8/20 with volume overload.  Assessment & Plan    1. Acute on chronic systolic CHF with RV failure: -Feels like he is not making much progress -He continues to appear volume overloaded, dry weight ~ 275 pounds -Discussed with renal, will start Lasix infusion -Continue KCl repletion -If minimal urine output is noted on Lasix infusion consider right heart catheterization on 8/24 -If patient has adequate urine output on Lasix infusion would continued throughout the weekend with possible right heart catheterization on 8/27 -No indication to repeat echo given recent study from 09/2016 -Not on beta  blocker given prior issue with significant bradycardia with Coreg -Continue lisinopril and spironolactone given his cardiomyopathy -Daily weights, strict Is and Os  2. Chronic Afib: -Rates currently well controlled -Asymptomatic   -Not on beta blocker given bradycardia -Eliquis on hold given plans for RHC prior to discharge -Heparin gtt -Less likely his Afib is playing a role in his CHF  3. CAD: -Without angina -Continue current medical therapy  4. ESRD s/p renal transplant: -Nephrology on board -Renal function stable   5. HLD: -Statin  Signed, Thurmond Butts  [place], [place] Pager: [phone] 11/14/2016, 8:34 AM   Attending Note Patient seen and examined, agree with detailed note above,  Patient presentation and plan discussed on rounds.  EKG lab work, chest x-ray, echocardiogram reviewed independently by myself  Improved breathing, cough is improved still with  shortness of breath, abdominal bloating Leg swelling persists but improving  on Lasix 60 IV twice a day but did not make much urine,  Neg 5-600 only  On exam, unable to estimate JVP, lungs with dullness to bases otherwise clear, heart sounds regular with normal S1-S2 no murmurs appreciated, abdomen obese soft nontender, trace minimally pitting lower extremity edema  Lab work showing creatinine 1.29, BUN 25  A/P 1) acute on chronic systolic CHF Case discussed with hospitalist service, would consider Lasix infusion given decreased urine output Continue lisinopril, Aldactone If urine output continues to run low on Lasix infusion, consider right heart catheterization tomorrow  2) chronic atrial fibrillation On heparin, rate well controlled Not on beta blocker secondary to bradycardia  3)CAD Currently with no symptoms of angina. No further workup at this time. Continue current medication regimen.  4) end-stage renal disease, history of transplant Nephrology following, monitor BMP  daily  Greater than 50% was spent in counseling and coordination of care with patient Total encounter time 25 minutes or more   Signed: Esmond Plants  M.D., Ph.D. [facility]

## 2016-11-15 NOTE — Progress Notes (Addendum)
[place] for electrolyte management   Pharmacy consulted for electrolyte management for 56 yo male patient with history significant for Heart Failure, Atrial fibrillation, and Renal Transplant. Patient's furosemide drip advanced to 59m/hr this am.   Plan:  Electrolytes are currently within normal limits. In setting of increased rate, will recheck potassium at 1800 and BMP with am labs on 8/25. Will limit replacement route to oral.   Allergies  Allergen Reactions  . Morphine Itching    Patient Measurements: Height: 5' 11" (180.3 cm) Weight: (!) 300 lb 14.4 oz (136.5 kg) IBW/kg (Calculated) : 75.3  Vital Signs: Temp: 98 F (36.7 C) (08/24 0736) Temp Source: Oral (08/24 0406) BP: 143/73 (08/24 0930) Pulse Rate: 69 (08/24 0935) Intake/Output from previous day: 08/23 0701 - 08/24 0700 In: 790.7 [P.O.:120; I.V.:670.7] Out: 1825 [Urine:1825] Intake/Output from this shift: Total I/O In: -  Out: 1330 [Urine:1330]  Labs:  Recent Labs  11/13/16 0253 11/13/16 1118 11/13/16 1910 11/14/16 0218 11/14/16 1350 11/15/16 0514  WBC 4.0  --   --   --   --  4.5  HGB 10.5*  --   --   --   --  10.6*  HCT 32.7*  --   --   --   --  32.6*  PLT 131*  --   --   --   --  138*  APTT 64* 58* 62* 69*  --   --   CREATININE 1.18  --   --   --  1.29* 1.52*   Estimated Creatinine Clearance: 75.7 mL/min (A) (by C-G formula based on SCr of 1.52 mg/dL (H)). Medications:  Scheduled:  . amLODipine  5 mg Oral Daily  . apixaban  5 mg Oral BID  . buPROPion  75 mg Oral BID  . insulin aspart  0-5 Units Subcutaneous QHS  . insulin aspart  0-9 Units Subcutaneous TID WC  . insulin glargine  10 Units Subcutaneous QHS  . lisinopril  40 mg Oral Daily  . multivitamin with minerals  1 tablet Oral Daily  . mycophenolate  360 mg Oral BID  . pantoprazole  40 mg Oral Daily  . potassium chloride  40 mEq Oral BID  . rosuvastatin  10 mg Oral Daily  . sodium chloride  flush  3 mL Intravenous Q12H  . sodium chloride flush  3 mL Intravenous Q12H  . spironolactone  25 mg Oral Daily  . sulfamethoxazole-trimethoprim  1 tablet Oral Once per day on Mon Wed Fri  . tacrolimus  1 mg Oral BID   Infusions:  . sodium chloride    . furosemide (LASIX) infusion 10 mg/hr (11/15/16 0856)    Pharmacy will continue to monitor and adjust per consult.   Rojelio Uhrich L 11/15/2016,10:21 AM

## 2016-11-15 NOTE — Progress Notes (Signed)
[facility], [place] 11/15/16  Subjective:  Doing fair today States he is not coughing as much and is able to take deep breaths No wheezing. Lower extremity edema is slightly improved. Weight is lower 134.4->133.4-> 136.4 kg Patient underwent right heart catheterization today. Severe pulmonary hypertension and elevated wedge pressure was noted.  Objective:  Vital signs in last 24 hours:  Temp:  [98 F (36.7 C)-98.4 F (36.9 C)] 98 F (36.7 C) (08/24 1132) Pulse Rate:  [42-69] 63 (08/24 1132) Resp:  [14-20] 18 (08/24 1132) BP: (116-159)/(56-86) 156/71 (08/24 1132) SpO2:  [91 %-96 %] 96 % (08/24 1132) Weight:  [136.5 kg (300 lb 14.4 oz)] 136.5 kg (300 lb 14.4 oz) (08/24 0406)  Weight change: 3.084 kg (6 lb 12.8 oz) Filed Weights   11/13/16 0454 11/14/16 0321 11/15/16 0406  Weight: 134.5 kg (296 lb 8 oz) 133.4 kg (294 lb 1.6 oz) (!) 136.5 kg (300 lb 14.4 oz)    Intake/Output:    Intake/Output Summary (Last 24 hours) at 11/15/16 1410 Last data filed at 11/15/16 1051  Gross per 24 hour  Intake           670.69 ml  Output             3080 ml  Net         -2409.31 ml     Physical Exam: General: NAD, sitting up in chair  HEENT anicteric  Neck supple  Pulm/lungs Improved exam, mild crackles at bases  CVS/Heart Regular, no rub  Abdomen:  Distended, non tender  Extremities: + edema,   Neurologic: Alert, oreinted  Skin: No acute lesions, discoloration from chronic venous stasis   Access:  Left arm AVF       Basic Metabolic Panel:   Recent Labs Lab 11/11/16 1649 11/12/16 0417 11/13/16 0253 11/14/16 1350 11/15/16 0514  NA 141 143 139 139 140  K 3.7 3.5 3.4* 4.4 4.1  CL 105 109 106 106 103  CO2 _0 32  GLUCOSE 91 105* 83 187* 91  BUN 21* 22* 23* 25* 28*  CREATININE 1.09 1.11 1.18 1.29* 1.52*  CALCIUM 9.2 8.8* 8.6* 8.9 9.1     CBC:  Recent Labs Lab 11/11/16 1649 11/12/16 0417 11/13/16 0253 11/15/16 0514  WBC 4.9  4.2 4.0 4.5  HGB 11.1* 10.5* 10.5* 10.6*  HCT 34.9* 32.5* 32.7* 32.6*  MCV 72.0* 72.3* 71.5* 72.5*  PLT 155 128* 131* 138*     No results found for: HEPBSAG, HEPBSAB, HEPBIGM    Microbiology:  No results found for this or any previous visit (from the past 240 hour(s)).  Coagulation Studies: No results for input(s): LABPROT, INR in the last 72 hours.  Urinalysis: No results for input(s): COLORURINE, LABSPEC, PHURINE, GLUCOSEU, HGBUR, BILIRUBINUR, KETONESUR, PROTEINUR, UROBILINOGEN, NITRITE, LEUKOCYTESUR in the last 72 hours.  Invalid input(s): APPERANCEUR    Imaging: No results found.   Medications:   . sodium chloride    . furosemide (LASIX) infusion 10 mg/hr (11/15/16 0856)   . amLODipine  5 mg Oral Daily  . apixaban  5 mg Oral BID  . buPROPion  75 mg Oral BID  . insulin aspart  0-5 Units Subcutaneous QHS  . insulin aspart  0-9 Units Subcutaneous TID WC  . insulin glargine  10 Units Subcutaneous QHS  . lisinopril  40 mg Oral Daily  . multivitamin with minerals  1 tablet Oral Daily  . mycophenolate  360 mg Oral BID  .  pantoprazole  40 mg Oral Daily  . potassium chloride  40 mEq Oral BID  . rosuvastatin  10 mg Oral Daily  . sodium chloride flush  3 mL Intravenous Q12H  . sodium chloride flush  3 mL Intravenous Q12H  . spironolactone  25 mg Oral Daily  . sulfamethoxazole-trimethoprim  1 tablet Oral Once per day on Mon Wed Fri  . tacrolimus  1 mg Oral BID   sodium chloride, acetaminophen **OR** acetaminophen, fluticasone, nitroGLYCERIN, ondansetron **OR** ondansetron (ZOFRAN) IV, oxyCODONE-acetaminophen, sodium chloride flush  Assessment/ Plan:  56 y.o. male with Diabetes, hypertension, Coronary disease, CABG, deceased donor renal transplant 09/13/2010, Gastric sleeve surgery 03/14/2014  was admitted on 11/11/2016 with worsening fluid overload. 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 Treated with valganciclovir. Hospitalization for cellulitis on left shin  again in 2017.  1. Anasarca, worsening fluid overload Agree with IV Lasix infusion. Agree with continuing Aldactone - Discussed low-salt diet with patient - Daily weights - Assess response of current therapy - Urinalysis is negative for protein. - Daily BMP - If IV diuretics continue to result in increasing serum creatinine, will consider ultrafiltration via with HD via AV access  2. ARF, Renal transplant in June 2012 immunosuppression induction with Campath, now maintenance with tacrolimus and mycophenolate.  1 month allograft biopsy July 2012- recovering ATN with early tubular atrophy, interstitial fibrosis Repeat biopsy March 2017 for increasing proteinuria- no acute rejection, showed diabetic, neuropathy, FSGS  - Continue home dose of immunosuppression Currently mycophenolate 360 mg twice a day Tacrolimus 1 mg twice a day  Concern over increasing serum creatinine/ ARF with IV diuresis. Repeat level this afternoon and tomorrow morning  3. Hypokalemia - Spironolactone    LOS: [place] 8/24/20182:10 PM  [facility] [place], [place]

## 2016-11-16 LAB — MAGNESIUM: Magnesium: 1.7 mg/dL (ref 1.7–2.4)

## 2016-11-16 LAB — BASIC METABOLIC PANEL
Anion gap: 6 (ref 5–15)
BUN: 34 mg/dL — AB (ref 6–20)
CO2: 35 mmol/L — AB (ref 22–32)
Calcium: 9.4 mg/dL (ref 8.9–10.3)
Chloride: 99 mmol/L — ABNORMAL LOW (ref 101–111)
Creatinine, Ser: 1.45 mg/dL — ABNORMAL HIGH (ref 0.61–1.24)
GFR calc Af Amer: >60 mL/min (ref >60)
GFR, EST NON AFRICAN AMERICAN: 52 mL/min — AB (ref >60)
GLUCOSE: 102 mg/dL — AB (ref 65–99)
POTASSIUM: 3.9 mmol/L (ref 3.5–5.1)
Sodium: 140 mmol/L (ref 135–145)

## 2016-11-16 LAB — CBC
HEMATOCRIT: 34.9 % — AB (ref 40.0–52.0)
Hemoglobin: 11.2 g/dL — ABNORMAL LOW (ref 13.0–18.0)
MCH: 23.5 pg — AB (ref 26.0–34.0)
MCHC: 32.2 g/dL (ref 32.0–36.0)
MCV: 73.1 fL — AB (ref 80.0–100.0)
PLATELETS: 130 10*3/uL — AB (ref 150–440)
RBC: 4.78 MIL/uL (ref 4.40–5.90)
RDW: 18.2 % — ABNORMAL HIGH (ref 11.5–14.5)
WBC: 4.5 10*3/uL (ref 3.8–10.6)

## 2016-11-16 LAB — GLUCOSE, CAPILLARY
Glucose-Capillary: 92 mg/dL (ref 65–99)
Glucose-Capillary: 151 mg/dL — ABNORMAL HIGH (ref 65–99)

## 2016-11-16 MED ORDER — FUROSEMIDE 40 MG PO TABS: 40.0000 mg | ORAL_TABLET | Freq: Two times a day (BID) | ORAL | 0 refills | Status: DC

## 2016-11-16 MED ORDER — SPIRONOLACTONE 25 MG PO TABS: 25.0000 mg | ORAL_TABLET | Freq: Every day | ORAL | 11 refills | Status: DC

## 2016-11-16 MED ORDER — INSULIN GLARGINE 100 UNITS/ML SOLOSTAR PEN: 10.0000 [IU] | PEN_INJECTOR | Freq: Every day | SUBCUTANEOUS | 11 refills | Status: DC

## 2016-11-16 NOTE — Progress Notes (Signed)
[facility], [place] 11/16/16  Subjective:  Doing better today States he is not coughing as much and is able to take deep breaths No wheezing. Lower extremity edema is improved. UOP 7700 cc Weight is lower 134.4->133.4-> 136.4->126 kg Severe pulmonary hypertension and elevated wedge pressure was noted on Rt heart Cath.  Objective:  Vital signs in last 24 hours:  Temp:  [97.8 F (36.6 C)-98.5 F (36.9 C)] 98 F (36.7 C) (08/25 0805) Pulse Rate:  [57-75] 57 (08/25 0805) Resp:  [18] 18 (08/25 0805) BP: (140-148)/(65-84) 141/65 (08/25 0805) SpO2:  [90 %-96 %] 91 % (08/25 0805) Weight:  [126 kg (277 lb 12.8 oz)-129 kg (284 lb 8 oz)] 126 kg (277 lb 12.8 oz) (08/25 0434)  Weight change: -7.439 kg (-16 lb 6.4 oz) Filed Weights   11/15/16 0406 11/15/16 2034 11/16/16 0434  Weight: (!) 136.5 kg (300 lb 14.4 oz) 129 kg (284 lb 8 oz) 126 kg (277 lb 12.8 oz)    Intake/Output:    Intake/Output Summary (Last 24 hours) at 11/16/16 1201 Last data filed at 11/16/16 1157  Gross per 24 hour  Intake           780.67 ml  Output             9125 ml  Net         -8344.33 ml     Physical Exam: General: NAD, sitting up in chair  HEENT anicteric  Neck supple  Pulm/lungs Improved exam,    CVS/Heart Regular, no rub  Abdomen:  Distended, non tender  Extremities: + edema, compression socks  Neurologic: Alert, oreinted  Skin: No acute lesions, discoloration from chronic venous stasis   Access:  Left arm AVF       Basic Metabolic Panel:   Recent Labs Lab 11/13/16 0253 11/14/16 1350 11/15/16 0514 11/15/16 1437 11/16/16 0434  NA 139 139 140 140 140  K 3.4* 4.4 4.1 3.9 3.9  CL 106 106 103 99* 99*  CO2 28 28 32 32 35*  GLUCOSE 83 187* 91 191* 102*  BUN 23* 25* 28* 29* 34*  CREATININE 1.18 1.29* 1.52* 1.44* 1.45*  CALCIUM 8.6* 8.9 9.1 9.4 9.4  MG  --   --   --   --  1.7     CBC:  Recent Labs Lab 11/11/16 1649 11/12/16 0417 11/13/16 0253  11/15/16 0514 11/16/16 0434  WBC 4.9 4.2 4.0 4.5 4.5  HGB 11.1* 10.5* 10.5* 10.6* 11.2*  HCT 34.9* 32.5* 32.7* 32.6* 34.9*  MCV 72.0* 72.3* 71.5* 72.5* 73.1*  PLT 155 128* 131* 138* 130*     No results found for: HEPBSAG, HEPBSAB, HEPBIGM    Microbiology:  No results found for this or any previous visit (from the past 240 hour(s)).  Coagulation Studies: No results for input(s): LABPROT, INR in the last 72 hours.  Urinalysis: No results for input(s): COLORURINE, LABSPEC, PHURINE, GLUCOSEU, HGBUR, BILIRUBINUR, KETONESUR, PROTEINUR, UROBILINOGEN, NITRITE, LEUKOCYTESUR in the last 72 hours.  Invalid input(s): APPERANCEUR    Imaging: No results found.   Medications:   . sodium chloride    . furosemide (LASIX) infusion 10 mg/hr (11/15/16 1639)   . amLODipine  5 mg Oral Daily  . apixaban  5 mg Oral BID  . buPROPion  75 mg Oral BID  . insulin aspart  0-5 Units Subcutaneous QHS  . insulin aspart  0-9 Units Subcutaneous TID WC  . insulin glargine  10 Units Subcutaneous QHS  .  lisinopril  40 mg Oral Daily  . multivitamin with minerals  1 tablet Oral Daily  . mycophenolate  360 mg Oral BID  . pantoprazole  40 mg Oral Daily  . potassium chloride  40 mEq Oral BID  . rosuvastatin  10 mg Oral Daily  . sodium chloride flush  3 mL Intravenous Q12H  . sodium chloride flush  3 mL Intravenous Q12H  . spironolactone  25 mg Oral Daily  . sulfamethoxazole-trimethoprim  1 tablet Oral Once per day on Mon Wed Fri  . tacrolimus  1 mg Oral BID   sodium chloride, acetaminophen **OR** acetaminophen, fluticasone, nitroGLYCERIN, ondansetron **OR** ondansetron (ZOFRAN) IV, oxyCODONE-acetaminophen, sodium chloride flush  Assessment/ Plan:  56 y.o. male with Diabetes, hypertension, Coronary disease, CABG, deceased donor renal transplant 09/13/2010, Gastric sleeve surgery 03/14/2014  was admitted on 11/11/2016 with worsening fluid overload. 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 Treated  with valganciclovir. Hospitalization for cellulitis on left shin again in 2017.  1. Anasarca, worsening fluid overload  - Discussed low-salt diet with patient, Daily weights - Urinalysis is negative for protein. - Good response to iv diuresis; weight down to 126 kg today - consider changing diuretics to 40 mg PO BID; continue spironolactone - Patient wants to follow up locally in [place], will set up follow up.  - outpatient labs on Tue/wed, Business card provided.   2. ARF, Renal transplant in June 2012 immunosuppression induction with Campath, now maintenance with tacrolimus and mycophenolate.  1 month allograft biopsy July 2012- recovering ATN with early tubular atrophy, interstitial fibrosis Repeat biopsy March 2017 for increasing proteinuria- no acute rejection, showed diabetic, neuropathy, FSGS  - Continue home dose of immunosuppression Currently mycophenolate 360 mg twice a day Tacrolimus 1 mg twice a day  3. Hypokalemia - continue low dose Spironolactone    LOS: 5 [facility] 8/25/201812:01 PM  [place], [place]

## 2016-11-16 NOTE — Progress Notes (Signed)
Progress Note  Patient Name: [name] Date of Encounter: 11/16/2016  Primary Cardiologist: Fletcher Anon    Subjective   Breathing better " I feel like I could run a 5K'  No CP    Inpatient Medications    Scheduled Meds: . amLODipine  5 mg Oral Daily  . apixaban  5 mg Oral BID  . buPROPion  75 mg Oral BID  . insulin aspart  0-5 Units Subcutaneous QHS  . insulin aspart  0-9 Units Subcutaneous TID WC  . insulin glargine  10 Units Subcutaneous QHS  . lisinopril  40 mg Oral Daily  . multivitamin with minerals  1 tablet Oral Daily  . mycophenolate  360 mg Oral BID  . pantoprazole  40 mg Oral Daily  . potassium chloride  40 mEq Oral BID  . rosuvastatin  10 mg Oral Daily  . sodium chloride flush  3 mL Intravenous Q12H  . sodium chloride flush  3 mL Intravenous Q12H  . spironolactone  25 mg Oral Daily  . sulfamethoxazole-trimethoprim  1 tablet Oral Once per day on Mon Wed Fri  . tacrolimus  1 mg Oral BID   Continuous Infusions: . sodium chloride    . furosemide (LASIX) infusion 10 mg/hr (11/15/16 1639)   PRN Meds: sodium chloride, acetaminophen **OR** acetaminophen, fluticasone, nitroGLYCERIN, ondansetron **OR** ondansetron (ZOFRAN) IV, oxyCODONE-acetaminophen, sodium chloride flush   Vital Signs    Vitals:   11/15/16 1755 11/15/16 2034 11/16/16 0434 11/16/16 0805  BP: (!) 141/84 (!) 148/83 140/76 (!) 141/65  Pulse: 65 75 (!) 58 (!) 57  Resp: _0 Temp: 98.5 F (36.9 C) 97.8 F (36.6 C) 98 F (36.7 C) 98 F (36.7 C)  TempSrc: Oral Oral Oral Oral  SpO2: 96% 96% 90% 91%  Weight:  284 lb 8 oz (129 kg) 277 lb 12.8 oz (126 kg)   Height:        Intake/Output Summary (Last 24 hours) at 11/16/16 0911 Last data filed at 11/16/16 0430  Gross per 24 hour  Intake           540.67 ml  Output             6450 ml  Net         -5909.33 ml    Net neg 10.4 L   Filed Weights   11/15/16 0406 11/15/16 2034 11/16/16 0434  Weight: (!) 300 lb 14.4 oz (136.5 kg) 284 lb 8 oz  (129 kg) 277 lb 12.8 oz (126 kg)    Telemetry    Atrial fib  - Personally Reviewed  ECG      Physical Exam   GEN: No acute distress.   Neck: JVP is increased   Cardiac: Irreg irreg  II/VI systolic murmur , no , rubs, or gallops.  Respiratory: Clear to auscultation bilaterally. GI: Soft, nontender, non-distended  MS: 1+edema; No deformity. Neuro:  Nonfocal  Psych: Normal affect   Labs    Chemistry Recent Labs Lab 11/11/16 1649  11/15/16 0514 11/15/16 1437 11/16/16 0434  NA 141  < > 140 140 140  K 3.7  < > 4.1 3.9 3.9  CL 105  < > 103 99* 99*  CO2 29  < > 32 32 35*  GLUCOSE 91  < > 91 191* 102*  BUN 21*  < > 28* 29* 34*  CREATININE 1.09  < > 1.52* 1.44* 1.45*  CALCIUM 9.2  < > 9.1 9.4 9.4  PROT 8.2*  --   --   --   --  ALBUMIN 3.8  --   --   --   --   AST 27  --   --   --   --   ALT 13*  --   --   --   --   ALKPHOS 125  --   --   --   --   BILITOT 1.2  --   --   --   --   GFRNONAA >60  < > 49* 53* 52*  GFRAA >60  < > 57* >60 >60  ANIONGAP 7  < > _0 < > = values in this interval not displayed.   Hematology Recent Labs Lab 11/13/16 0253 11/15/16 0514 11/16/16 0434  WBC 4.0 4.5 4.5  RBC 4.58 4.50 4.78  HGB 10.5* 10.6* 11.2*  HCT 32.7* 32.6* 34.9*  MCV 71.5* 72.5* 73.1*  MCH 23.0* 23.5* 23.5*  MCHC 32.1 32.4 32.2  RDW 17.9* 17.8* 18.2*  PLT 131* 138* 130*    Cardiac Enzymes Recent Labs Lab 11/14/16 0353 11/14/16 0728 11/14/16 1129  TROPONINI <0.03 <0.03 <0.03   No results for input(s): TROPIPOC in the last 168 hours.   BNPNo results for input(s): BNP, PROBNP in the last 168 hours.   DDimer No results for input(s): DDIMER in the last 168 hours.   Radiology    No results found.  Cardiac Studies  RHC 8/24:  PCWP 31    PAP 94/38  RV 96/11    Patient Profile     56 y.o. male with history of CAD s/p CABG in 2004 with no recurrent ischemic events since, chronic systolic CHF/ICM with prior EF of 35-40% now improved to 45% by TTE in  09/2016, chronic Afib on Eliquis with prior significant bradycardia on small dose Coreg leading to discontinuation, ESRD previously on HD from 2004-2012 now s/p kidney transplant, anemia of chronic disease, DM2, HTN, HLD, obesity s/p gastric bypass in 09/4079 without complications, and OSA on CPAP who was directly admitted from our office on 8/20 with volume overload.  Assessment & Plan    1  Acute on chronic systolic CHF  Marked volume overload on admit  Yesterday diuresed 7 L Feeling much better  Still with some volume increase on exam No on lasix gtt  Plan to switch to po 40 bid  (was on 40 1qd in Feb 2018)  Alos on aldactone Not at dry weight yet  Patient said a few months ago he was 56   OK to d/c later today   Will make sure he has close outpt f/u  2  Chronic atrial fib  Rate control  Eliquis   3  CAD  No symptoms of agina  4  ESRD  S/p transplant  Will f/u with Dr Candiss Norse in 1 wk    5  HL  Continue statin    5  HL    Signed, Dorris Carnes, MD  11/16/2016, 9:11 AM

## 2016-11-16 NOTE — Progress Notes (Signed)
Discharge instructions explained to pt and pts family/ verbalized an understanding/ iv and tele removed/ / RX given to pt/ home med returned to pt/ transported off unit via wheelchair.

## 2016-11-16 NOTE — Care Management Note (Signed)
Case Management Note  Patient Details  Name: [name] MRN: [id number] Date of Birth: [birth year]  Subjective/Objective:            Has a follow-up appointment with [facility] for 02/03/17 at 9:30am at  [facility].       Action/Plan:   Expected Discharge Date:  11/16/16               Expected Discharge Plan:     In-House Referral:     Discharge planning Services     Post Acute Care Choice:    Choice offered to:     DME Arranged:    DME Agency:     HH Arranged:    HH Agency:     Status of Service:     If discussed at [employer], dates discussed:    Additional Comments:  Dicky Boer A, RN 11/16/2016, 3:30 PM

## 2016-11-16 NOTE — Discharge Summary (Addendum)
[place] [facility] [place] NAME: [name]    MR#:  [id number]  DATE OF BIRTH:  1960-07-17  DATE OF ADMISSION:  11/11/2016 ADMITTING PHYSICIAN: Hillary Bow, MD  DATE OF DISCHARGE: 11/16/2016  PRIMARY CARE PHYSICIAN: [name], MD    ADMISSION DIAGNOSIS:  Acute on chronic systolic heart failure acute on chronic diastolic chf  DISCHARGE DIAGNOSIS:  Acute on chronic congestive heart failure systolic  SECONDARY DIAGNOSIS:   Past Medical History:  Diagnosis Date  . Allergy    seasonal  . Amnestic MCI (mild cognitive impairment with memory loss)   . Asthma   . Atrial fibrillation ([place])   . Atrial fibrillation ([place]) 2016  . Benign neoplasm of colon   . CAD (coronary artery disease) 2005   had CABG  . CHF (congestive heart failure) ([place])   . Chronic venous stasis dermatitis of both lower extremities   . Cytomegaloviral disease ([place]) 2017  . Depression   . Diabetes mellitus   . Diverticulosis   . Dyspnea   . Erectile dysfunction   . ESRD (end stage renal disease) ([place]) 2005   Dialysis then renal transplant  . GERD (gastroesophageal reflux disease)   . Gout   . Hyperlipidemia    low since renal failure  . Hypertension   . Kidney transplant status, cadaveric 2012  . Obesity   . Pneumonia   . Purpura ([place])   . Sleep apnea    bipap with oxygen 2 l  . Trigger finger   . Ulcer 05/2016   Left shin  . Wears glasses     HOSPITAL COURSE:   JesusGarciais a 56 y.o.malewith a known history of renal transplant, diabetes, coronary artery disease, congestive heart failure and atrial fibrillation. He has been gaining weight over the last few weeks. He has met with his transplant team and cardiology team. He has gained 22 pounds. His diuretics haven't been working.  1. Acute Systolic Congestive heart Failure with 22 pound weight gain. -Patient still feels significantly volume overloaded. C -Patient initially was on  IV Lasix 40 mg twice a day---changed to IV Lasix drip 10 mg per hour along with Zaroxolyn----diuresed very well---now changed back to oral Lasix 40 twice a day + spironolactone 25 mg daily (new) -Patient's weight is at baseline -s/p RHC showed increased right sided pressures and wedge pressure  2. Atrial fibrillation rate controlled. -  Now off  IVheparin drip - resuemd eliquis  3. History of renal transplant - continue immunosuppressive's.  4. Essential hypertension - resume home meds   5. Hyperlipidemia -On  Crestor  6. GERD on omeprazole  7. Type 2 diabetes mellitus.  -Sliding scale insulin and lower dose Lantus 10 units  Overall at baseline. Patient will discharge later home today.  Case discussed with Care Management/Social Worker. Management plans discussed with the patient, family and they are in agreement.  CODE STATUS: Full  DVT Prophylaxis: eliquis CONSULTS OBTAINED:  Treatment Team:  [name], MD Murlean Iba, MD  DRUG ALLERGIES:   Allergies  Allergen Reactions  . Morphine Itching    DISCHARGE MEDICATIONS:   Current Discharge Medication List    START taking these medications   Details  spironolactone (ALDACTONE) 25 MG tablet Take 1 tablet (25 mg total) by mouth daily. Qty: 30 tablet, Refills: 11      CONTINUE these medications which have CHANGED   Details  furosemide (LASIX) 40 MG tablet Take 1 tablet (40 mg  total) by mouth 2 (two) times daily. Qty: 30 tablet, Refills: 0    insulin glargine (LANTUS) 100 unit/mL SOPN Inject 0.1 mLs (10 Units total) into the skin at bedtime. Currently using 15 units Qty: 15 mL, Refills: 11      CONTINUE these medications which have NOT CHANGED   Details  amLODipine (NORVASC) 5 MG tablet TAKE 1 TABLET BY MOUTH DAILY Qty: 90 tablet, Refills: 3    beclomethasone (QVAR) 80 MCG/ACT inhaler Inhale 2 puffs into the lungs 2 (two) times daily. Qty: 1 Inhaler, Refills: 6    buPROPion (WELLBUTRIN)  75 MG tablet Take 1 tablet (75 mg total) by mouth 2 (two) times daily. Qty: 180 tablet, Refills: 3    ELIQUIS 5 MG TABS tablet TAKE 1 TABLET BY MOUTH 2 TIMES DAILY Qty: 60 tablet, Refills: 3    eplerenone (INSPRA) 25 MG tablet Take 1 tablet (25 mg total) by mouth daily. Qty: 90 tablet, Refills: 3    lisinopril (PRINIVIL,ZESTRIL) 40 MG tablet TAKE 1 TABLET BY MOUTH DAILY Qty: 30 tablet, Refills: 3    mometasone (NASONEX) 50 MCG/ACT nasal spray PLACE 2 SPRAYS INTO THE NOSE AT BEDTIME. Qty: 17 g, Refills: 0    Multiple Vitamin (MULTIVITAMIN WITH MINERALS) TABS tablet Take 1 tablet by mouth daily.    mycophenolate (MYFORTIC) 180 MG EC tablet Take 360 mg by mouth 2 (two) times daily.     omeprazole (PRILOSEC) 20 MG capsule TAKE 1 CAPSULE BY MOUTH ONCE DAILY Qty: 90 capsule, Refills: 3    potassium chloride (MICRO-K) 10 MEQ CR capsule Take 20 mEq by mouth 2 (two) times daily.    rosuvastatin (CRESTOR) 10 MG tablet Take 1 tablet (10 mg total) by mouth daily. Qty: 90 tablet, Refills: 3    sulfamethoxazole-trimethoprim (BACTRIM,SEPTRA) 400-80 MG tablet Take 1 tablet by mouth 3 (three) times a week.    tacrolimus (PROGRAF) 0.5 MG capsule Take 1 mg by mouth 2 (two) times daily.    CIALIS 20 MG tablet TAKE 1/2 TO 1 TABLET BY MOUTH EVERY OTHER DAY AS NEEDED Qty: 5 tablet, Refills: 11      STOP taking these medications     insulin aspart (NOVOLOG FLEXPEN) 100 UNIT/ML FlexPen      metolazone (ZAROXOLYN) 2.5 MG tablet      torsemide (DEMADEX) 20 MG tablet         If you experience worsening of your admission symptoms, develop shortness of breath, life threatening emergency, suicidal or homicidal thoughts you must seek medical attention immediately by calling 911 or calling your MD immediately  if symptoms less severe.  You Must read complete instructions/literature along with all the possible adverse reactions/side effects for all the Medicines you take and that have been prescribed  to you. Take any new Medicines after you have completely understood and accept all the possible adverse reactions/side effects.   Please note  You were cared for by a hospitalist during your hospital stay. If you have any questions about your discharge medications or the care you received while you were in the hospital after you are discharged, you can call the unit and asked to speak with the hospitalist on call if the hospitalist that took care of you is not available. Once you are discharged, your primary care physician will handle any further medical issues. Please note that NO REFILLS for any discharge medications will be authorized once you are discharged, as it is imperative that you return to your primary care physician (or  establish a relationship with a primary care physician if you do not have one) for your aftercare needs so that they can reassess your need for medications and monitor your lab values. Today   SUBJECTIVE    Feels very good. VITAL SIGNS:  Blood pressure (!) 141/65, pulse (!) 57, temperature 98 F (36.7 C), temperature source Oral, resp. rate 18, height _0  (1.803 m), weight 126 kg (277 lb 12.8 oz), SpO2 91 %.  I/O:    Intake/Output Summary (Last 24 hours) at 11/16/16 1508 Last data filed at 11/16/16 1405  Gross per 24 hour  Intake              720 ml  Output             8525 ml  Net            -7805 ml    PHYSICAL EXAMINATION:  GENERAL:  56 y.o.-year-old patient lying in the bed with no acute distress.  EYES: Pupils equal, round, reactive to light and accommodation. No scleral icterus. Extraocular muscles intact.  HEENT: Head atraumatic, normocephalic. Oropharynx and nasopharynx clear.  NECK:  Supple, no jugular venous distention. No thyroid enlargement, no tenderness.  LUNGS: Normal breath sounds bilaterally, no wheezing, rales,rhonchi or crepitation. No use of accessory muscles of respiration.  CARDIOVASCULAR: S1, S2 normal. No murmurs, rubs, or  gallops.  ABDOMEN: Soft, non-tender, non-distended. Bowel sounds present. No organomegaly or mass.  EXTREMITIES: No pedal edema, cyanosis, or clubbing.  NEUROLOGIC: Cranial nerves II through XII are intact. Muscle strength 5/5 in all extremities. Sensation intact. Gait not checked.  PSYCHIATRIC: The patient is alert and oriented x 3.  SKIN: No obvious rash, lesion, or ulcer.   DATA REVIEW:   CBC   Recent Labs Lab 11/16/16 0434  WBC 4.5  HGB 11.2*  HCT 34.9*  PLT 130*    Chemistries   Recent Labs Lab 11/11/16 1649  11/16/16 0434  NA 141  < > 140  K 3.7  < > 3.9  CL 105  < > 99*  CO2 29  < > 35*  GLUCOSE 91  < > 102*  BUN 21*  < > 34*  CREATININE 1.09  < > 1.45*  CALCIUM 9.2  < > 9.4  MG  --   --  1.7  AST 27  --   --   ALT 13*  --   --   ALKPHOS 125  --   --   BILITOT 1.2  --   --   < > = values in this interval not displayed.  Microbiology Results   No results found for this or any previous visit (from the past 240 hour(s)).  RADIOLOGY:  No results found.   Management plans discussed with the patient, family and they are in agreement.  CODE STATUS:     Code Status Orders        Start     Ordered   11/11/16 1637  Full code  Continuous     11/11/16 1637    Code Status History    Date Active Date Inactive Code Status Order ID Comments User Context   10/12/2015  8:05 PM 10/18/2015  4:46 PM Full Code [id number]  Gladstone Lighter, MD Inpatient   08/30/2015  9:22 PM 09/04/2015 10:17 PM Full Code [id number]  Fritzi Mandes, MD Inpatient   10/13/2014  7:35 AM 10/14/2014  4:47 PM Full Code [id number]  Juluis Mire, MD Inpatient    Advance  Directive Documentation     Most Recent Value  Type of Advance Directive  Healthcare Power of Attorney  Pre-existing out of facility DNR order (yellow form or pink MOST form)  -  "MOST" Form in Place?  -      TOTAL TIME TAKING CARE OF THIS PATIENT: **40 minutes.    Adriane Guglielmo M.D on 11/16/2016 at 3:08 PM  Between 7am  to 6pm - Pager - [phone] After 6pm go to [email or web address] - password [email or web address] [facility]  Office  [phone]  CC: Primary care physician; [name], MD

## 2016-11-16 NOTE — Progress Notes (Signed)
Notified Dr. Posey Pronto about patient's complaints of cramping since yesterday, patient on lasix drip, order to check magnesium level and if low, to call pharmacy to treat. No other concern at the moment. RN will continue to monitor.

## 2016-11-16 NOTE — Progress Notes (Signed)
[place] for electrolyte management   Pharmacy consulted for electrolyte management for 56 yo male patient with history significant for Heart Failure, Atrial fibrillation, and Renal Transplant. Patient's furosemide drip advanced to 34m/hr this am.   Plan:  Electrolytes are currently within normal limits. In setting of increased rate, will recheck potassium at 1800 and BMP with am labs on 8/25. Will limit replacement route to oral.   Allergies  Allergen Reactions  . Morphine Itching    Patient Measurements: Height: 5' 11" (180.3 cm) Weight: 277 lb 12.8 oz (126 kg) IBW/kg (Calculated) : 75.3  Vital Signs: Temp: 98 F (36.7 C) (08/25 0434) Temp Source: Oral (08/25 0434) BP: 140/76 (08/25 0434) Pulse Rate: 58 (08/25 0434) Intake/Output from previous day: 08/24 0701 - 08/25 0700 In: 540.7 [P.O.:480; I.V.:60.7] Out: 7780 [Urine:7780] Intake/Output from this shift: Total I/O In: -  Out: 3125 [Urine:3125]  Labs:  Recent Labs  11/13/16 1118 11/13/16 1910 11/14/16 0218  11/15/16 0514 11/15/16 1437 11/16/16 0434  WBC  --   --   --   --  4.5  --  4.5  HGB  --   --   --   --  10.6*  --  11.2*  HCT  --   --   --   --  32.6*  --  34.9*  PLT  --   --   --   --  138*  --  130*  APTT 58* 62* 69*  --   --   --   --   CREATININE  --   --   --   < > 1.52* 1.44* 1.45*  < > = values in this interval not displayed. Estimated Creatinine Clearance: 76 mL/min (A) (by C-G formula based on SCr of 1.45 mg/dL (H)). Medications:  Scheduled:  . amLODipine  5 mg Oral Daily  . apixaban  5 mg Oral BID  . buPROPion  75 mg Oral BID  . insulin aspart  0-5 Units Subcutaneous QHS  . insulin aspart  0-9 Units Subcutaneous TID WC  . insulin glargine  10 Units Subcutaneous QHS  . lisinopril  40 mg Oral Daily  . multivitamin with minerals  1 tablet Oral Daily  . mycophenolate  360 mg Oral BID  . pantoprazole  40 mg Oral Daily  . potassium chloride  40  mEq Oral BID  . rosuvastatin  10 mg Oral Daily  . sodium chloride flush  3 mL Intravenous Q12H  . sodium chloride flush  3 mL Intravenous Q12H  . spironolactone  25 mg Oral Daily  . sulfamethoxazole-trimethoprim  1 tablet Oral Once per day on Mon Wed Fri  . tacrolimus  1 mg Oral BID   Infusions:  . sodium chloride    . furosemide (LASIX) infusion 10 mg/hr (11/15/16 1639)    Pharmacy will continue to monitor and adjust per consult.   8/25 Electrolyte panel: no replacement indicated. F/u BMP tomorrow AM.  Alexxia Stankiewicz S 11/16/2016,6:21 AM

## 2016-11-19 ENCOUNTER — Encounter: Payer: Self-pay | Admitting: Family

## 2016-11-19 ENCOUNTER — Ambulatory Visit: Payer: 59 | Attending: Family | Admitting: Family

## 2016-11-19 ENCOUNTER — Telehealth: Payer: Self-pay

## 2016-11-19 VITALS — BP 140/77 | HR 60 | Resp 20 | Ht 71.0 in | Wt 268.2 lb

## 2016-11-19 DIAGNOSIS — I2510 Atherosclerotic heart disease of native coronary artery without angina pectoris: Secondary | ICD-10-CM | POA: Diagnosis not present

## 2016-11-19 DIAGNOSIS — Z951 Presence of aortocoronary bypass graft: Secondary | ICD-10-CM | POA: Diagnosis not present

## 2016-11-19 DIAGNOSIS — I5032 Chronic diastolic (congestive) heart failure: Secondary | ICD-10-CM | POA: Diagnosis not present

## 2016-11-19 DIAGNOSIS — I10 Essential (primary) hypertension: Secondary | ICD-10-CM

## 2016-11-19 DIAGNOSIS — R42 Dizziness and giddiness: Secondary | ICD-10-CM | POA: Insufficient documentation

## 2016-11-19 DIAGNOSIS — I272 Pulmonary hypertension, unspecified: Secondary | ICD-10-CM | POA: Diagnosis not present

## 2016-11-19 DIAGNOSIS — J45909 Unspecified asthma, uncomplicated: Secondary | ICD-10-CM | POA: Diagnosis not present

## 2016-11-19 DIAGNOSIS — G3184 Mild cognitive impairment, so stated: Secondary | ICD-10-CM | POA: Insufficient documentation

## 2016-11-19 DIAGNOSIS — Z940 Kidney transplant status: Secondary | ICD-10-CM | POA: Insufficient documentation

## 2016-11-19 DIAGNOSIS — Z9889 Other specified postprocedural states: Secondary | ICD-10-CM | POA: Insufficient documentation

## 2016-11-19 DIAGNOSIS — Z803 Family history of malignant neoplasm of breast: Secondary | ICD-10-CM | POA: Diagnosis not present

## 2016-11-19 DIAGNOSIS — I5042 Chronic combined systolic (congestive) and diastolic (congestive) heart failure: Secondary | ICD-10-CM | POA: Insufficient documentation

## 2016-11-19 DIAGNOSIS — F329 Major depressive disorder, single episode, unspecified: Secondary | ICD-10-CM | POA: Diagnosis not present

## 2016-11-19 DIAGNOSIS — Z8249 Family history of ischemic heart disease and other diseases of the circulatory system: Secondary | ICD-10-CM | POA: Diagnosis not present

## 2016-11-19 DIAGNOSIS — N186 End stage renal disease: Secondary | ICD-10-CM | POA: Insufficient documentation

## 2016-11-19 DIAGNOSIS — Z87891 Personal history of nicotine dependence: Secondary | ICD-10-CM | POA: Diagnosis not present

## 2016-11-19 DIAGNOSIS — E669 Obesity, unspecified: Secondary | ICD-10-CM | POA: Insufficient documentation

## 2016-11-19 DIAGNOSIS — Z833 Family history of diabetes mellitus: Secondary | ICD-10-CM | POA: Insufficient documentation

## 2016-11-19 DIAGNOSIS — E785 Hyperlipidemia, unspecified: Secondary | ICD-10-CM | POA: Diagnosis not present

## 2016-11-19 DIAGNOSIS — M109 Gout, unspecified: Secondary | ICD-10-CM | POA: Diagnosis not present

## 2016-11-19 DIAGNOSIS — I4891 Unspecified atrial fibrillation: Secondary | ICD-10-CM | POA: Insufficient documentation

## 2016-11-19 DIAGNOSIS — I872 Venous insufficiency (chronic) (peripheral): Secondary | ICD-10-CM | POA: Insufficient documentation

## 2016-11-19 DIAGNOSIS — I132 Hypertensive heart and chronic kidney disease with heart failure and with stage 5 chronic kidney disease, or end stage renal disease: Secondary | ICD-10-CM | POA: Insufficient documentation

## 2016-11-19 DIAGNOSIS — Z992 Dependence on renal dialysis: Secondary | ICD-10-CM | POA: Diagnosis not present

## 2016-11-19 DIAGNOSIS — I5022 Chronic systolic (congestive) heart failure: Secondary | ICD-10-CM | POA: Insufficient documentation

## 2016-11-19 DIAGNOSIS — Z885 Allergy status to narcotic agent status: Secondary | ICD-10-CM | POA: Insufficient documentation

## 2016-11-19 DIAGNOSIS — D692 Other nonthrombocytopenic purpura: Secondary | ICD-10-CM | POA: Diagnosis not present

## 2016-11-19 DIAGNOSIS — Z794 Long term (current) use of insulin: Secondary | ICD-10-CM | POA: Insufficient documentation

## 2016-11-19 DIAGNOSIS — Z79899 Other long term (current) drug therapy: Secondary | ICD-10-CM | POA: Insufficient documentation

## 2016-11-19 DIAGNOSIS — I482 Chronic atrial fibrillation, unspecified: Secondary | ICD-10-CM

## 2016-11-19 DIAGNOSIS — G4733 Obstructive sleep apnea (adult) (pediatric): Secondary | ICD-10-CM | POA: Insufficient documentation

## 2016-11-19 DIAGNOSIS — Z800 Family history of malignant neoplasm of digestive organs: Secondary | ICD-10-CM | POA: Insufficient documentation

## 2016-11-19 NOTE — Patient Instructions (Signed)
Continue weighing daily and call for an overnight weight gain of > 2 pounds or a weekly weight gain of >5 pounds. 

## 2016-11-19 NOTE — Progress Notes (Addendum)
Patient ID: [name], male    DOB: 11-11-1960, 56 y.o.   MRN: [id number]  HPI  [name] is a 56 y/o with a history of asthma, atrial fibrillation, CAD (CABG), chronic venous stastis of lower legs, depression, DM, renal disease (s/p renal transplant), GERD, HTN, hyperlipidemia, obstructive sleep apnea (with oxygen), remote tobacco use and chronic heart failure.   Echo done 10/16/16 showed an EF of 45-50% along with normal pulmonary artery systolic pressure. EF has improved from 35-40% back in July 2016. Right cardiac catheterization done 11/15/16 showed severely elevated filling pressure, severe pulmonary HTN and low normal cardiac output. Pulmonary wedge pressure still elevated at 31 mmHg.   Admitted 11/11/16 with HF exacerbation. Had a 22 pound weight gain prior to admission. Initially needed IV diuretics and then an IV lasix drip with metolazone. Cardiology and nephrology consult obtained. Cardiac catheterization done. Discharged home after 5 days.   He presents today for his initial visit with a chief complaint of minimal edema in bilateral lower legs. Notices it after he's been standing/sitting for long periods of time with resolution after leg elevation. Has associated intermittent light-headedness along with this. Denies any fatigue, chest pain, shortness of breath or weight gain.   Past Medical History:  Diagnosis Date  . Allergy    seasonal  . Amnestic MCI (mild cognitive impairment with memory loss)   . Asthma   . Atrial fibrillation ([place])   . Atrial fibrillation ([place]) 2016  . Benign neoplasm of colon   . CAD (coronary artery disease) 2005   had CABG  . CHF (congestive heart failure) ([place])   . Chronic venous stasis dermatitis of both lower extremities   . Cytomegaloviral disease ([place]) 2017  . Depression   . Diabetes mellitus   . Diverticulosis   . Dyspnea   . Erectile dysfunction   . ESRD (end stage renal disease) ([place]) 2005   Dialysis then renal transplant  . GERD  (gastroesophageal reflux disease)   . Gout   . Hyperlipidemia    low since renal failure  . Hypertension   . Kidney transplant status, cadaveric 2012  . Obesity   . Pneumonia   . Purpura ([place])   . Sleep apnea    bipap with oxygen 2 l  . Trigger finger   . Ulcer 05/2016   Left shin  . Wears glasses    Past Surgical History:  Procedure Laterality Date  . BARIATRIC SURGERY    . CARDIAC CATHETERIZATION     [facility]  . COLONOSCOPY WITH PROPOFOL N/A 04/22/2013   Procedure: COLONOSCOPY WITH PROPOFOL;  Surgeon: [name], MD;  [facility];  Service: Endoscopy;  Laterality: N/A;  . CORONARY ARTERY BYPASS GRAFT  2005   X 4  . DG ANGIO AV SHUNT*L*     right and left upper arms  . FASCIOTOMY  03/03/2012   Procedure: FASCIOTOMY;  Surgeon: [name], MD;  Location: [place];  Service: Orthopedics;  Laterality: Right;  FASCIOTOMY RIGHT SMALL FINGER  . FASCIOTOMY Left 08/17/2013   Procedure: FASCIOTOMY LEFT RING;  Surgeon: [name], MD;  Location: [place];  Service: Orthopedics;  Laterality: Left;  . INCISION AND DRAINAGE ABSCESS Left 10/15/2015   Procedure: INCISION AND DRAINAGE ABSCESS;  Surgeon: [name], MD;  Location: [facility];  Service: General;  Laterality: Left;  . KIDNEY TRANSPLANT  09/13/2010   cadaver--at [facility]  . RIGHT HEART CATH N/A 11/15/2016  Procedure: RIGHT HEART CATH;  Surgeon: [name], MD;  Location: [place] [facility];  Service: Cardiovascular;  Laterality: N/A;  . TYMPANIC MEMBRANE REPAIR  1/12   left  . VASECTOMY     Family History  Problem Relation Age of Onset  . Heart disease Father   . Kidney failure Father   . Kidney disease Father   . Diabetes Maternal Grandmother   . Breast cancer Maternal Grandmother   . Valvular heart disease Mother   . Liver cancer Paternal Uncle   . Liver cancer Paternal Grandmother   . Prostate cancer Neg Hx    Social History  Substance Use Topics  . Smoking  status: Former Smoker    Types: Cigars    Quit date: 09/02/1994  . Smokeless tobacco: Never Used  . Alcohol use 0.0 - 0.6 oz/week     Comment: occasional- 3 in the past year   Allergies  Allergen Reactions  . Morphine Itching   Prior to Admission medications   Medication Sig Start Date End Date Taking? Authorizing Provider  amLODipine (NORVASC) 5 MG tablet TAKE 1 TABLET BY MOUTH DAILY 06/21/16  Yes [name], MD  beclomethasone (QVAR) 80 MCG/ACT inhaler Inhale 2 puffs into the lungs 2 (two) times daily. 05/10/16  Yes [name], MD  buPROPion (WELLBUTRIN) 75 MG tablet Take 1 tablet (75 mg total) by mouth 2 (two) times daily. 02/21/16  Yes [name], MD  ELIQUIS 5 MG TABS tablet TAKE 1 TABLET BY MOUTH 2 TIMES DAILY 07/08/16  Yes [name], MD  furosemide (LASIX) 40 MG tablet Take 1 tablet (40 mg total) by mouth 2 (two) times daily. 11/16/16  Yes Fritzi Mandes, MD  insulin glargine (LANTUS) 100 unit/mL SOPN Inject 0.1 mLs (10 Units total) into the skin at bedtime. Currently using 15 units 11/16/16  Yes Fritzi Mandes, MD  lisinopril (PRINIVIL,ZESTRIL) 40 MG tablet TAKE 1 TABLET BY MOUTH DAILY 11/04/16  Yes [name], MD  mometasone (NASONEX) 50 MCG/ACT nasal spray PLACE 2 SPRAYS INTO THE NOSE AT BEDTIME. 05/27/16  Yes [name], MD  Multiple Vitamin (MULTIVITAMIN WITH MINERALS) TABS tablet Take 1 tablet by mouth daily.   Yes [provider]  mycophenolate (MYFORTIC) 180 MG EC tablet Take 360 mg by mouth 2 (two) times daily.    Yes [provider]  omeprazole (PRILOSEC) 20 MG capsule TAKE 1 CAPSULE BY MOUTH ONCE DAILY 02/21/16  Yes [name] I, MD  potassium chloride (MICRO-K) 10 MEQ CR capsule Take 20 mEq by mouth 2 (two) times daily.   Yes [provider]  rosuvastatin (CRESTOR) 10 MG tablet Take 1 tablet (10 mg total) by mouth daily. Patient taking differently: Take 10 mg by mouth daily. taking 02/02/16 11/19/16 Yes [name], MD  spironolactone (ALDACTONE) 25 MG tablet Take 1 tablet (25 mg total) by mouth daily. 11/16/16 11/16/17 Yes Fritzi Mandes, MD  sulfamethoxazole-trimethoprim (BACTRIM,SEPTRA) 400-80 MG tablet Take 1 tablet by mouth 3 (three) times a week.   Yes [provider]  tacrolimus (PROGRAF) 0.5 MG capsule Take 1 mg by mouth 2 (two) times daily.   Yes [provider]    Review of Systems  Constitutional: Negative for appetite change and fatigue.  HENT: Negative for congestion, postnasal drip and sore throat.   Eyes: Negative.   Respiratory: Negative for chest tightness and shortness of breath.   Cardiovascular: Positive for leg swelling (minimal). Negative for chest pain and  palpitations.  Gastrointestinal: Negative for abdominal distention and abdominal pain.  Endocrine: Negative.   Genitourinary: Negative.   Musculoskeletal: Negative for back pain and neck pain.  Skin: Negative.   Allergic/Immunologic: Negative.   Neurological: Positive for light-headedness (in the morning when changing positions too quickly). Negative for dizziness.  Hematological: Negative for adenopathy. Does not bruise/bleed easily.  Psychiatric/Behavioral: Negative for dysphoric mood and sleep disturbance. The patient is not nervous/anxious.    Vitals:   11/19/16 0906  BP: 140/77  Pulse: 60  Resp: 20  SpO2: 96%  Weight: 268 lb 4 oz (121.7 kg)  Height: _0  (1.803 m)   Wt Readings from Last 3 Encounters:  11/19/16 268 lb 4 oz (121.7 kg)  11/16/16 277 lb 12.8 oz (126 kg)  11/11/16 297 lb 4 oz (134.8 kg)   Lab Results  Component Value Date   CREATININE 1.45 (H) 11/16/2016   CREATININE 1.44 (H) 11/15/2016   CREATININE 1.52 (H) 11/15/2016   Physical Exam  Constitutional: He is oriented to person, place, and time. He appears well-developed and well-nourished.  HENT:  Head: Normocephalic and atraumatic.  Neck: Normal range of motion. Neck supple. No JVD present.  Cardiovascular:  Normal rate and regular rhythm.   Pulmonary/Chest: Effort normal. He has no wheezes. He has no rales.  Abdominal: Soft. He exhibits no distension. There is no tenderness.  Musculoskeletal: He exhibits no edema or tenderness.  Neurological: He is alert and oriented to person, place, and time.  Skin: Skin is warm and dry.  Psychiatric: He has a normal mood and affect. His behavior is normal. Thought content normal.  Nursing note and vitals reviewed.   Assessment & Plan:  1: Chronic heart failure with preserved ejection fraction- - NYHA class I - euvolemic today - already weighing daily and he says that his weight has been stable. Reminded to call for an overnight weight gain of >2 pounds or a weekly weight gain of >5 pounds - not adding salt and has been reading food labels. Discussed the importance of closely following a 2062m sodium diet - exercising often and will use the treadmill for 30 minutes and then do ~ 1 hr of weights - is on spironolactone and potassium - sees cardiologist (Fletcher Anon 12/04/16; will need a BMP done to assess potassium level - ReDS vest reading was 49% but patient had a broad chest size even though BMI was 37.41 - PharmD went in and reviewed medications with the patient - [place] [facility] [place]  2: HTN- - BP looks good today - has to schedule an appointment with PCP (Silvio Pate  3: Atrial fibrillation- - currently rate controlled at this time - on apixaban and amlodipine  Patient did not bring his medications nor a list. Each medication was verbally reviewed with the patient and he was encouraged to bring the bottles to every visit to confirm accuracy of list.  Return in 1 month or sooner for any questions/problems before then.

## 2016-11-19 NOTE — Telephone Encounter (Signed)
Tried to call pt to see how he was doing after recent extensive stay in the hospital but his VM was full. Dr Silvio Pate said he did not need an OV with him at this time.

## 2016-11-20 ENCOUNTER — Other Ambulatory Visit: Payer: Self-pay

## 2016-11-20 DIAGNOSIS — Z794 Long term (current) use of insulin: Secondary | ICD-10-CM

## 2016-11-20 DIAGNOSIS — R601 Generalized edema: Secondary | ICD-10-CM | POA: Diagnosis not present

## 2016-11-20 DIAGNOSIS — Z940 Kidney transplant status: Secondary | ICD-10-CM | POA: Diagnosis not present

## 2016-11-20 DIAGNOSIS — E118 Type 2 diabetes mellitus with unspecified complications: Secondary | ICD-10-CM

## 2016-11-20 NOTE — Patient Outreach (Signed)
[facility]) Care Management  11/20/2016  [name] 05/20/1960 [id number]   Spoke to patient by phone today as a follow up to his hospital admission last week.  He reports 30 lbs of fluid were removed from him and he has some low blood sugars as a result of Lantus 15 units qday- it was decreased to Lantus 10 units q day- [name] continues to use 10 units and is not requiring Novolog at meals.  Fasting blood sugars 80-117 mg/dl and after meal blood sugars around 130 mg/dl.  He reports his current weight as 263 lbs, down from 290.1lbs when I saw him on November 04, 2016.   Patient has been in to see cardiologist and nephrologist since discharge- he has a follow up appointment with Nephrology tomorrow. He reports feeling very well- has been to the gym each day since Monday, 11/18/16 and is walking 30 minutes per day.  C/o No SOB, or pain.  He is weighting daily- he has been instructed by the CHF clinic to call them with any concerns and to call if he gains 4lbs or more a day.     [name], RN, BA, [place], [place] Direct Dial:  [phone]  Fax:  [phone] E-mail: [name] [address] [place], [place]  [address]

## 2016-11-21 DIAGNOSIS — E876 Hypokalemia: Secondary | ICD-10-CM | POA: Diagnosis not present

## 2016-11-21 DIAGNOSIS — R601 Generalized edema: Secondary | ICD-10-CM | POA: Diagnosis not present

## 2016-11-21 DIAGNOSIS — Z940 Kidney transplant status: Secondary | ICD-10-CM | POA: Diagnosis not present

## 2016-11-25 DIAGNOSIS — G4733 Obstructive sleep apnea (adult) (pediatric): Secondary | ICD-10-CM | POA: Diagnosis not present

## 2016-11-25 DIAGNOSIS — I482 Chronic atrial fibrillation: Secondary | ICD-10-CM | POA: Diagnosis not present

## 2016-11-25 DIAGNOSIS — E662 Morbid (severe) obesity with alveolar hypoventilation: Secondary | ICD-10-CM | POA: Diagnosis not present

## 2016-11-26 ENCOUNTER — Other Ambulatory Visit: Payer: Self-pay | Admitting: Internal Medicine

## 2016-11-26 ENCOUNTER — Other Ambulatory Visit: Payer: Self-pay | Admitting: Cardiovascular Disease

## 2016-11-27 ENCOUNTER — Other Ambulatory Visit: Payer: Self-pay

## 2016-11-27 DIAGNOSIS — I5042 Chronic combined systolic (congestive) and diastolic (congestive) heart failure: Secondary | ICD-10-CM

## 2016-11-27 NOTE — Progress Notes (Unsigned)
BMET ordered per [facility].

## 2016-11-27 NOTE — Patient Outreach (Addendum)
[facility]) Care Management  11/27/2016  EVERETTE MALL 12/11/1960 [id number]   Follow up phone call to Jasiyah to inquire re: progress post discharge from hospital.  Patient weight stable- 265lbs.  He has met with the nephrologist on 11/21/16- no medication changes were made. Continues to take Lantus 10 units qday but has not required Novolog insulin. Reports highest fasting blood sugars 165m/dl and highest post meal blood sugar 1680mdl.  Denies SOB or chest pain. Exercising 40 minutes per day on the treadmill. Reports no open lesions on his legs or feet and denies depression- he continues to take all medications as ordered.   Follow up appointments scheduled with the transplant team on 11/29/16. Reminded to ask for a flu vaccine at that visit.    JuGentry FitzRN, BA, MHA, CDE Diabetes Coordinator Inpatient Diabetes Program  [phone] Pager) [phone]/07/2016 10:51 AM

## 2016-11-29 DIAGNOSIS — I10 Essential (primary) hypertension: Secondary | ICD-10-CM | POA: Diagnosis not present

## 2016-11-29 DIAGNOSIS — R809 Proteinuria, unspecified: Secondary | ICD-10-CM | POA: Diagnosis not present

## 2016-11-29 DIAGNOSIS — Z9989 Dependence on other enabling machines and devices: Secondary | ICD-10-CM | POA: Diagnosis not present

## 2016-11-29 DIAGNOSIS — E872 Acidosis: Secondary | ICD-10-CM | POA: Diagnosis not present

## 2016-11-29 DIAGNOSIS — Z792 Long term (current) use of antibiotics: Secondary | ICD-10-CM | POA: Diagnosis not present

## 2016-11-29 DIAGNOSIS — Z9884 Bariatric surgery status: Secondary | ICD-10-CM | POA: Diagnosis not present

## 2016-11-29 DIAGNOSIS — G4733 Obstructive sleep apnea (adult) (pediatric): Secondary | ICD-10-CM | POA: Diagnosis not present

## 2016-11-29 DIAGNOSIS — Z794 Long term (current) use of insulin: Secondary | ICD-10-CM | POA: Diagnosis not present

## 2016-11-29 DIAGNOSIS — Z4822 Encounter for aftercare following kidney transplant: Secondary | ICD-10-CM | POA: Diagnosis not present

## 2016-11-29 DIAGNOSIS — E785 Hyperlipidemia, unspecified: Secondary | ICD-10-CM | POA: Diagnosis not present

## 2016-11-29 DIAGNOSIS — Z940 Kidney transplant status: Secondary | ICD-10-CM | POA: Diagnosis not present

## 2016-11-29 DIAGNOSIS — Z7952 Long term (current) use of systemic steroids: Secondary | ICD-10-CM | POA: Diagnosis not present

## 2016-11-29 DIAGNOSIS — E119 Type 2 diabetes mellitus without complications: Secondary | ICD-10-CM | POA: Diagnosis not present

## 2016-11-29 DIAGNOSIS — I50810 Right heart failure, unspecified: Secondary | ICD-10-CM | POA: Diagnosis not present

## 2016-11-29 DIAGNOSIS — E669 Obesity, unspecified: Secondary | ICD-10-CM | POA: Diagnosis not present

## 2016-11-29 DIAGNOSIS — Z79899 Other long term (current) drug therapy: Secondary | ICD-10-CM | POA: Diagnosis not present

## 2016-11-29 DIAGNOSIS — B259 Cytomegaloviral disease, unspecified: Secondary | ICD-10-CM | POA: Diagnosis not present

## 2016-11-29 DIAGNOSIS — I4891 Unspecified atrial fibrillation: Secondary | ICD-10-CM | POA: Diagnosis not present

## 2016-11-29 DIAGNOSIS — Z5181 Encounter for therapeutic drug level monitoring: Secondary | ICD-10-CM | POA: Diagnosis not present

## 2016-11-29 DIAGNOSIS — E876 Hypokalemia: Secondary | ICD-10-CM | POA: Diagnosis not present

## 2016-12-05 ENCOUNTER — Ambulatory Visit: Payer: 59 | Admitting: Cardiovascular Disease

## 2016-12-05 ENCOUNTER — Telehealth: Payer: Self-pay | Admitting: Cardiovascular Disease

## 2016-12-05 ENCOUNTER — Encounter: Payer: Self-pay | Admitting: Cardiovascular Disease

## 2016-12-05 NOTE — Telephone Encounter (Signed)
S/w pt who was unaware his Friday appt w/[name] had been moved to today.  He reports recent 6lbs weight gain. Instructed by MD to take extra lasix today. Pt agreeable and understands he will not be charged a no show fee as appt change was not confirmed.  S/w Hiraa who will call pt to reschedule.

## 2016-12-05 NOTE — Progress Notes (Deleted)
Cardiology Office Note   Date:  12/05/2016   ID:  [name], [name] Oct 29, 1960, MRN [id number]  PCP:  [name], MD  Cardiologist:   [name], MD   No chief complaint on file.     History of Present Illness: [name] is a 56 y.o. male who presents for  a follow up visit. He has known history of coronary artery disease status post CABG in 2004 with no recurrent ischemic events since then, chronic systolic heart failure with ejection fraction of 35-40 %, type 2 diabetes, end-stage renal disease previously on hemodialysis from 2004 - 2012 when he got a kidney transplant, hypertension, hyperlipidemia, chronic atrial fibrillation and sleep apnea.   She underwent gastric bypass in September 3329 without complications.  He did have significant bradycardia while he was on small dose carvedilol which was discontinued.  He had significant worsening of right-sided heart failure over the last few months with significant abdominal swelling, leg edema and weight gain. Echocardiogram in July showed an EF of 51%, diastolic function could not be determined and pulmonary pressure could not be calculated accurately.  In spite of aggressive oral diuresis, the patient continued to deteriorate with massive volume overload. Thus, I admitted him directly from clinic last time for intravenous diuresis. I performed right heart catheterization on August 24 which showed severely elevated filling pressures, severe pulmonary hypertension and low normal cardiac output. The patient required furosemide drip and metolazone. His admission weight was 297 pounds   Past Medical History:  Diagnosis Date  . Allergy    seasonal  . Amnestic MCI (mild cognitive impairment with memory loss)   . Asthma   . Atrial fibrillation ([place])   . Atrial fibrillation ([place]) 2016  . Benign neoplasm of colon   . CAD (coronary artery disease) 2005   had CABG  . CHF (congestive heart failure) ([place])   . Chronic  venous stasis dermatitis of both lower extremities   . Cytomegaloviral disease ([place]) 2017  . Depression   . Diabetes mellitus   . Diverticulosis   . Dyspnea   . Erectile dysfunction   . ESRD (end stage renal disease) ([place]) 2005   Dialysis then renal transplant  . GERD (gastroesophageal reflux disease)   . Gout   . Hyperlipidemia    low since renal failure  . Hypertension   . Kidney transplant status, cadaveric 2012  . Obesity   . Pneumonia   . Purpura ([place])   . Sleep apnea    bipap with oxygen 2 l  . Trigger finger   . Ulcer 05/2016   Left shin  . Wears glasses     Past Surgical History:  Procedure Laterality Date  . BARIATRIC SURGERY    . CARDIAC CATHETERIZATION     [facility]  . COLONOSCOPY WITH PROPOFOL N/A 04/22/2013   Procedure: COLONOSCOPY WITH PROPOFOL;  Surgeon: [name], MD;  [facility];  Service: Endoscopy;  Laterality: N/A;  . CORONARY ARTERY BYPASS GRAFT  2005   X 4  . DG ANGIO AV SHUNT*L*     right and left upper arms  . FASCIOTOMY  03/03/2012   Procedure: FASCIOTOMY;  Surgeon: [name], MD;  Location: [place];  Service: Orthopedics;  Laterality: Right;  FASCIOTOMY RIGHT SMALL FINGER  . FASCIOTOMY Left 08/17/2013   Procedure: FASCIOTOMY LEFT RING;  Surgeon: [name], MD;  Location: [place];  Service: Orthopedics;  Laterality: Left;  .  INCISION AND DRAINAGE ABSCESS Left 10/15/2015   Procedure: INCISION AND DRAINAGE ABSCESS;  Surgeon: [name], MD;  Location: [facility];  Service: General;  Laterality: Left;  . KIDNEY TRANSPLANT  09/13/2010   cadaver--at [facility]  . RIGHT HEART CATH N/A 11/15/2016   Procedure: RIGHT HEART CATH;  Surgeon: [name], MD;  Location: [place] [facility];  Service: Cardiovascular;  Laterality: N/A;  . TYMPANIC MEMBRANE REPAIR  1/12   left  . VASECTOMY       Current Outpatient Prescriptions  Medication Sig Dispense Refill  . amLODipine (NORVASC) 5 MG tablet TAKE  1 TABLET BY MOUTH DAILY 90 tablet 3  . beclomethasone (QVAR) 80 MCG/ACT inhaler Inhale 2 puffs into the lungs 2 (two) times daily. 1 Inhaler 6  . buPROPion (WELLBUTRIN) 75 MG tablet Take 1 tablet (75 mg total) by mouth 2 (two) times daily. 180 tablet 3  . ELIQUIS 5 MG TABS tablet TAKE 1 TABLET BY MOUTH 2 TIMES DAILY 60 tablet 3  . furosemide (LASIX) 40 MG tablet Take 1 tablet (40 mg total) by mouth 2 (two) times daily. 30 tablet 0  . insulin glargine (LANTUS) 100 unit/mL SOPN Inject 0.1 mLs (10 Units total) into the skin at bedtime. Currently using 15 units (Patient taking differently: Inject 10 Units into the skin at bedtime. Currently using 10 units) 15 mL 11  . lisinopril (PRINIVIL,ZESTRIL) 40 MG tablet TAKE 1 TABLET BY MOUTH DAILY 30 tablet 3  . mometasone (NASONEX) 50 MCG/ACT nasal spray PLACE 2 SPRAYS INTO THE NOSE AT BEDTIME. 17 g 11  . Multiple Vitamin (MULTIVITAMIN WITH MINERALS) TABS tablet Take 1 tablet by mouth daily.    . mycophenolate (MYFORTIC) 180 MG EC tablet Take 360 mg by mouth 2 (two) times daily.     Marland Kitchen omeprazole (PRILOSEC) 20 MG capsule TAKE 1 CAPSULE BY MOUTH ONCE DAILY 90 capsule 3  . potassium chloride (MICRO-K) 10 MEQ CR capsule Take 20 mEq by mouth 2 (two) times daily.    . rosuvastatin (CRESTOR) 10 MG tablet Take 1 tablet (10 mg total) by mouth daily. (Patient taking differently: Take 10 mg by mouth daily. taking) 90 tablet 3  . spironolactone (ALDACTONE) 25 MG tablet Take 1 tablet (25 mg total) by mouth daily. 30 tablet 11  . sulfamethoxazole-trimethoprim (BACTRIM,SEPTRA) 400-80 MG tablet Take 1 tablet by mouth 3 (three) times a week.    . tacrolimus (PROGRAF) 0.5 MG capsule Take 1 mg by mouth 2 (two) times daily.     No current facility-administered medications for this visit.     Allergies:   Morphine    Social History:  The patient  reports that he quit smoking about 22 years ago. His smoking use included Cigars. He has never used smokeless tobacco. He reports  that he drinks alcohol. He reports that he does not use drugs.   Family History:  The patient's family history includes Breast cancer in his maternal grandmother; Diabetes in his maternal grandmother; Heart disease in his father; Kidney disease in his father; Kidney failure in his father; Liver cancer in his paternal grandmother and paternal uncle; Valvular heart disease in his mother.    ROS:  Please see the history of present illness.   Otherwise, review of systems are positive for none.   All other systems are reviewed and negative.    PHYSICAL EXAM: VS:  There were no vitals taken for this visit. , BMI There is no height or weight on file to  calculate BMI. GEN: Well nourished, well developed, in no acute distress  HEENT: normal  Neck: Significant  JVD to earlobe,  no carotid bruits, or masses Cardiac: Irregularly irregular; no  rubs, or gallops, moderate leg  edema . 2/6 systolic ejection murmur in the aortic area. Respiratory:  clear to auscultation bilaterally, normal work of breathing GI: soft, nontender, , + BS.  Moderately distended abdomen with possible ascites.  MS: no deformity or atrophy  Skin: warm and dry, no rash Neuro:  Strength and sensation are intact Psych: euthymic mood, full affect   EKG:  EKG is ordered today. The ekg ordered today demonstrates atrial fibrillation with nonspecific IVCD.  Ventricular rate is 59 bpm.   Recent Labs: 11/11/2016: ALT 13; TSH 2.513 11/16/2016: BUN 34; Creatinine, Ser 1.45; Hemoglobin 11.2; Magnesium 1.7; Platelets 130; Potassium 3.9; Sodium 140    Lipid Panel    Component Value Date/Time   CHOL 92 03/06/2016 0736   CHOL 161 03/07/2015 0823   TRIG 73 03/06/2016 0736   HDL 34 (L) 03/06/2016 0736   HDL 38 (L) 03/07/2015 0823   CHOLHDL 2.7 03/06/2016 0736   VLDL 15 03/06/2016 0736   LDLCALC 43 03/06/2016 0736   LDLCALC 105 (H) 03/07/2015 0823   LDLDIRECT 41.9 04/20/2012 1618      Wt Readings from Last 3 Encounters:  11/19/16  268 lb 4 oz (121.7 kg)  11/16/16 277 lb 12.8 oz (126 kg)  11/11/16 297 lb 4 oz (134.8 kg)       No flowsheet data found.    ASSESSMENT AND PLAN:  1. Acute on chronic systolic heart failure:  The patient appears to be significantly volume overloaded.He is 22 pounds above his dry weight. We have failed with outpatient diuresis including oral torsemide and furosemide.  Due to that, I recommend hospital admission for IV diuresis with furosemide 40 mg twice daily to start with. He might require a higher dose depending on his response. We should try to get his weight down to around 275 pounds. No need to repeat echocardiogram. He is not on a beta blocker due to previous bradycardia on carvedilol. Continue lisinopril 40 mg daily.  I recommend a right heart catheterization in few days after diuresis and holding Eliquis. I am concerned about the degree of his right-sided heart failure and we might have to look for other etiologies for this.  2. Chronic atrial fibrillation: Rate is controlled without medications. He is tolerating anticoagulation with Eliquis. I don't think atrial fibrillation is responsible for his recent volume overload as he has been in atrial fibrillation since 2016. Hold Eliquis and start heparin drip tomorrow in anticipation of right heart catheterization.  3. Coronary artery disease involving native coronary arteries without angina: Continue medical therapy.   4. Hyperlipidemia: Continue treatment with rosuvastatin 10 mg daily. Most recent LDL was 43.   5. Status post kidney transplant: Recommend nephrology consultation to assist with his care in ruling out significant proteinuria or hypoalbuminemia causing some of his issues.  Disposition:   Direct hospital admission to telemetry. I discussed the case with Dr. Naaman Plummer   Signed,  [name], MD  12/05/2016 7:58 AM    [place]

## 2016-12-06 ENCOUNTER — Ambulatory Visit: Payer: 59 | Admitting: Cardiovascular Disease

## 2016-12-09 ENCOUNTER — Ambulatory Visit: Payer: Medicare Other | Admitting: Internal Medicine

## 2016-12-11 ENCOUNTER — Ambulatory Visit: Payer: Medicare Other | Admitting: Internal Medicine

## 2016-12-20 ENCOUNTER — Telehealth: Payer: Self-pay | Admitting: Family

## 2016-12-20 ENCOUNTER — Ambulatory Visit: Payer: 59 | Admitting: Family

## 2016-12-20 ENCOUNTER — Telehealth: Payer: Self-pay | Admitting: Cardiovascular Disease

## 2016-12-20 DIAGNOSIS — Z4822 Encounter for aftercare following kidney transplant: Secondary | ICD-10-CM | POA: Diagnosis not present

## 2016-12-20 DIAGNOSIS — Z792 Long term (current) use of antibiotics: Secondary | ICD-10-CM | POA: Diagnosis not present

## 2016-12-20 DIAGNOSIS — Z79899 Other long term (current) drug therapy: Secondary | ICD-10-CM | POA: Diagnosis not present

## 2016-12-20 DIAGNOSIS — Z5181 Encounter for therapeutic drug level monitoring: Secondary | ICD-10-CM | POA: Diagnosis not present

## 2016-12-20 DIAGNOSIS — Z7952 Long term (current) use of systemic steroids: Secondary | ICD-10-CM | POA: Diagnosis not present

## 2016-12-20 NOTE — Telephone Encounter (Signed)
Pt states he had a tooth extraction on Tues 10/2, and asks about stopping his blood thinner. Please call and advise

## 2016-12-20 NOTE — Telephone Encounter (Signed)
Reviewed w/[name]. Per verbal order, pt should stop Eliquis 2 days prior to single tooth extraction. Reviewed instructions with pt who verbalized understanding.  He confirms only one tooth will be pulled.

## 2016-12-20 NOTE — Telephone Encounter (Signed)
Patient did not show for his Heart Failure Clinic appointment on 12/20/16. Will attempt to reschedule.

## 2016-12-25 ENCOUNTER — Encounter: Payer: Self-pay | Admitting: Internal Medicine

## 2016-12-25 ENCOUNTER — Ambulatory Visit (INDEPENDENT_AMBULATORY_CARE_PROVIDER_SITE_OTHER): Payer: 59 | Admitting: Internal Medicine

## 2016-12-25 VITALS — BP 136/74 | HR 78 | Temp 97.7°F | Ht 71.0 in | Wt 279.0 lb

## 2016-12-25 DIAGNOSIS — M24542 Contracture, left hand: Secondary | ICD-10-CM | POA: Diagnosis not present

## 2016-12-25 DIAGNOSIS — E1121 Type 2 diabetes mellitus with diabetic nephropathy: Secondary | ICD-10-CM | POA: Diagnosis not present

## 2016-12-25 DIAGNOSIS — Z940 Kidney transplant status: Secondary | ICD-10-CM | POA: Diagnosis not present

## 2016-12-25 DIAGNOSIS — I5022 Chronic systolic (congestive) heart failure: Secondary | ICD-10-CM | POA: Diagnosis not present

## 2016-12-25 DIAGNOSIS — Z794 Long term (current) use of insulin: Secondary | ICD-10-CM | POA: Diagnosis not present

## 2016-12-25 DIAGNOSIS — Z23 Encounter for immunization: Secondary | ICD-10-CM | POA: Diagnosis not present

## 2016-12-25 DIAGNOSIS — E662 Morbid (severe) obesity with alveolar hypoventilation: Secondary | ICD-10-CM | POA: Diagnosis not present

## 2016-12-25 DIAGNOSIS — I482 Chronic atrial fibrillation, unspecified: Secondary | ICD-10-CM

## 2016-12-25 DIAGNOSIS — G4733 Obstructive sleep apnea (adult) (pediatric): Secondary | ICD-10-CM | POA: Diagnosis not present

## 2016-12-25 DIAGNOSIS — I2510 Atherosclerotic heart disease of native coronary artery without angina pectoris: Secondary | ICD-10-CM

## 2016-12-25 LAB — HM DIABETES FOOT EXAM

## 2016-12-25 NOTE — Assessment & Plan Note (Signed)
Doing well Follows at [facility]

## 2016-12-25 NOTE — Assessment & Plan Note (Signed)
Lab Results  Component Value Date   HGBA1C 6.1 10/09/2016   Good control Will recheck in 6 months or so

## 2016-12-25 NOTE — Progress Notes (Signed)
Subjective:    Patient ID: [name], male    DOB: 11-Jul-1960, 56 y.o.   MRN: [id number]  HPI Here for hospital follow up  Feels much better from the hospital stay Currently has 4-5# extra--skipped fluid pill last night after dental extraction (and still bleeding)--has to go back Weighing daily and closely monitoring at home Furosemide does work at night (25m) but doesn't seem to work in day Has been seen at CHF clinic--though missed last appt Breathing is okay No chest pain No palpitations Fluid retention was in legs and abdomen Now on spironolactone [facility]  Continues on the eliquis--but stopped 2 days before the dental extractions  Hasn't seen Carl [name] A1c 6.1% on 7/18 Had to adjust insulin down due to low sugar reactions  Current Outpatient Prescriptions on File Prior to Visit  Medication Sig Dispense Refill  . amLODipine (NORVASC) 5 MG tablet TAKE 1 TABLET BY MOUTH DAILY 90 tablet 3  . beclomethasone (QVAR) 80 MCG/ACT inhaler Inhale 2 puffs into the lungs 2 (two) times daily. 1 Inhaler 6  . buPROPion (WELLBUTRIN) 75 MG tablet Take 1 tablet (75 mg total) by mouth 2 (two) times daily. 180 tablet 3  . ELIQUIS 5 MG TABS tablet TAKE 1 TABLET BY MOUTH 2 TIMES DAILY 60 tablet 3  . furosemide (LASIX) 40 MG tablet Take 1 tablet (40 mg total) by mouth 2 (two) times daily. 30 tablet 0  . insulin glargine (LANTUS) 100 unit/mL SOPN Inject 0.1 mLs (10 Units total) into the skin at bedtime. Currently using 15 units (Patient taking differently: Inject 10 Units into the skin at bedtime. Currently using 10 units) 15 mL 11  . lisinopril (PRINIVIL,ZESTRIL) 40 MG tablet TAKE 1 TABLET BY MOUTH DAILY 30 tablet 3  . mometasone (NASONEX) 50 MCG/ACT nasal spray PLACE 2 SPRAYS INTO THE NOSE AT BEDTIME. 17 g 11  . Multiple Vitamin (MULTIVITAMIN WITH MINERALS) TABS tablet Take 1 tablet by mouth daily.    . mycophenolate  (MYFORTIC) 180 MG EC tablet Take 360 mg by mouth 2 (two) times daily.     .Marland Kitchenomeprazole (PRILOSEC) 20 MG capsule TAKE 1 CAPSULE BY MOUTH ONCE DAILY 90 capsule 3  . potassium chloride (MICRO-K) 10 MEQ CR capsule Take 20 mEq by mouth 2 (two) times daily.    .Marland Kitchenspironolactone (ALDACTONE) 25 MG tablet Take 1 tablet (25 mg total) by mouth daily. 30 tablet 11  . sulfamethoxazole-trimethoprim (BACTRIM,SEPTRA) 400-80 MG tablet Take 1 tablet by mouth 3 (three) times a week.    . tacrolimus (PROGRAF) 0.5 MG capsule Take 1 mg by mouth 2 (two) times daily.    . rosuvastatin (CRESTOR) 10 MG tablet Take 1 tablet (10 mg total) by mouth daily. (Patient taking differently: Take 10 mg by mouth daily. taking) 90 tablet 3   No current facility-administered medications on file prior to visit.     Allergies  Allergen Reactions  . Morphine Itching    Past Medical History:  Diagnosis Date  . Allergy    seasonal  . Amnestic MCI (mild cognitive impairment with memory loss)   . Asthma   . Atrial fibrillation ([place]   . At[place] fibrillation [place] 2016  . Benign neoplasm of colon   . CAD (coronary artery disease) 2005   had CABG  . [place]estive heart failure) ([place]   . Chronic venous stasis dermatitis of both lower extremities   . Cytomegaloviral  disease ([place]) 2017  . Depression   . Diabetes mellitus   . Diverticulosis   . Dyspnea   . Erectile dysfunction   . ESRD (end stage renal disease) ([place]) 2005   Dialysis then renal transplant  . GERD (gastroesophageal reflux disease)   . Gout   . Hyperlipidemia    low since renal failure  . Hypertension   . Kidney transplant status, cadaveric 2012  . Obesity   . Pneumonia   . Purpura ([place])   . Sleep apnea    bipap with oxygen 2 l  . Trigger finger   . Ulcer 05/2016   Left shin  . Wears glasses     Past Surgical History:  Procedure Laterality Date  . BARIATRIC SURGERY    . CARDIAC CATHETERIZATION     [facility]  . COLONOSCOPY WITH PROPOFOL N/A 04/22/2013     Procedure: COLONOSCOPY WITH PROPOFOL;  Surgeon: [name], MD;  [facility];  Service: Endoscopy;  Laterality: N/A;  . CORONARY ARTERY BYPASS GRAFT  2005   X 4  . DG ANGIO AV SHUNT*L*     right and left upper arms  . FASCIOTOMY  03/03/2012   Procedure: FASCIOTOMY;  Surgeon: [name], MD;  Location: [place];  Service: Orthopedics;  Laterality: Right;  FASCIOTOMY RIGHT SMALL FINGER  . FASCIOTOMY Left 08/17/2013   Procedure: FASCIOTOMY LEFT RING;  Surgeon: [name], MD;  Location: [place];  Service: Orthopedics;  Laterality: Left;  . INCISION AND DRAINAGE ABSCESS Left 10/15/2015   Procedure: INCISION AND DRAINAGE ABSCESS;  Surgeon: [name], MD;  Location: [facility];  Service: General;  Laterality: Left;  . KIDNEY TRANSPLANT  09/13/2010   cadaver--at [facility]  . RIGHT HEART CATH N/A 11/15/2016   Procedure: RIGHT HEART CATH;  Surgeon: [name], MD;  Location: [place] [facility];  Service: Cardiovascular;  Laterality: N/A;  . TYMPANIC MEMBRANE REPAIR  1/12   left  . VASECTOMY      Family History  Problem Relation Age of Onset  . Heart disease Father   . Kidney failure Father   . Kidney disease Father   . Diabetes Maternal Grandmother   . Breast cancer Maternal Grandmother   . Valvular heart disease Mother   . Liver cancer Paternal Uncle   . Liver cancer Paternal Grandmother   . Prostate cancer Neg Hx     Social History   Social History  . Marital status: Married    Spouse name: N/A  . Number of children: 2  . Years of education: N/A   Occupational History  . 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 Smoking status: Former Smoker    Types: Cigars    Quit date: 09/02/1994  . Smokeless tobacco: Never Used  . Alcohol use 0.0 - 0.6 oz/week     Comment: occasional- 3 in the past year  . Drug use: No  . Sexual activity: Not on file   Other Topics  Concern  . Not on file   Social History Narrative   In school for culinary arts--- will finish 12/16      Has living will   Wife is health care POA   Would accept resuscitation attempts   Hasn't considered tube feedings   Review of Systems Gets to gym daily--- walks, light weights, etc Didn't sleep well last night--normally she does okay Wears  compression socks--though needs help getting them up Contracture left 5th finger---needs referral    Objective:   Physical Exam  Constitutional: No distress.  Neck: No thyromegaly present.  Cardiovascular: Normal rate and intact distal pulses.  Exam reveals no gallop.   No murmur heard. irregular  Pulmonary/Chest: Effort normal and breath sounds normal. No respiratory distress. He has no wheezes. He has no rales.  Lymphadenopathy:    He has no cervical adenopathy.  Neurological:  Normal sensation in feet  Skin:  Stasis changes in feet/calves but no ulcers or other lesions          Assessment & Plan:

## 2016-12-25 NOTE — Assessment & Plan Note (Signed)
Compensated Doing better with current furosemide and spironolactone Recent labs are fine

## 2016-12-25 NOTE — Assessment & Plan Note (Signed)
Rate control fine On eliquis in general (on hold till dental bleeding stops)

## 2016-12-25 NOTE — Addendum Note (Signed)
Addended by: Pilar Grammes on: 12/25/2016 11:36 AM   Modules accepted: Orders

## 2017-01-02 ENCOUNTER — Telehealth: Payer: Self-pay

## 2017-01-02 NOTE — Telephone Encounter (Signed)
Okay to send sildenafil 100  #10 x 11 1/2-1 tab daily prn

## 2017-01-02 NOTE — Telephone Encounter (Signed)
Call from pt requesting rx change from Cialis to Viagra. Says insurance no longer covers Cialis but does cover Viagra.

## 2017-01-03 MED ORDER — SILDENAFIL CITRATE 100 MG PO TABS: 50.0000 mg | ORAL_TABLET | Freq: Every day | ORAL | 11 refills | Status: DC | PRN

## 2017-01-03 NOTE — Telephone Encounter (Signed)
New rx sent in to [facility]

## 2017-01-08 DIAGNOSIS — M720 Palmar fascial fibromatosis [Dupuytren]: Secondary | ICD-10-CM | POA: Diagnosis not present

## 2017-01-20 ENCOUNTER — Ambulatory Visit
Admission: RE | Admit: 2017-01-20 | Discharge: 2017-01-20 | Disposition: A | Discharge status: Home / Self Care | Payer: 59 | Source: Ambulatory Visit | Attending: Family | Admitting: Family

## 2017-01-20 ENCOUNTER — Other Ambulatory Visit: Payer: Self-pay | Admitting: Family

## 2017-01-20 ENCOUNTER — Ambulatory Visit: Payer: 59 | Admitting: Family

## 2017-01-20 ENCOUNTER — Encounter: Payer: Self-pay | Admitting: Family

## 2017-01-20 VITALS — BP 162/77 | HR 74 | Resp 18 | Ht 71.0 in | Wt 290.4 lb

## 2017-01-20 DIAGNOSIS — F329 Major depressive disorder, single episode, unspecified: Secondary | ICD-10-CM | POA: Insufficient documentation

## 2017-01-20 DIAGNOSIS — I482 Chronic atrial fibrillation, unspecified: Secondary | ICD-10-CM

## 2017-01-20 DIAGNOSIS — I132 Hypertensive heart and chronic kidney disease with heart failure and with stage 5 chronic kidney disease, or end stage renal disease: Secondary | ICD-10-CM | POA: Insufficient documentation

## 2017-01-20 DIAGNOSIS — G3184 Mild cognitive impairment, so stated: Secondary | ICD-10-CM | POA: Insufficient documentation

## 2017-01-20 DIAGNOSIS — I4891 Unspecified atrial fibrillation: Secondary | ICD-10-CM | POA: Diagnosis not present

## 2017-01-20 DIAGNOSIS — I5031 Acute diastolic (congestive) heart failure: Secondary | ICD-10-CM

## 2017-01-20 DIAGNOSIS — E785 Hyperlipidemia, unspecified: Secondary | ICD-10-CM | POA: Insufficient documentation

## 2017-01-20 DIAGNOSIS — I5041 Acute combined systolic (congestive) and diastolic (congestive) heart failure: Secondary | ICD-10-CM

## 2017-01-20 DIAGNOSIS — Z841 Family history of disorders of kidney and ureter: Secondary | ICD-10-CM | POA: Diagnosis not present

## 2017-01-20 DIAGNOSIS — Z7901 Long term (current) use of anticoagulants: Secondary | ICD-10-CM | POA: Insufficient documentation

## 2017-01-20 DIAGNOSIS — Z87891 Personal history of nicotine dependence: Secondary | ICD-10-CM | POA: Diagnosis not present

## 2017-01-20 DIAGNOSIS — Z940 Kidney transplant status: Secondary | ICD-10-CM | POA: Diagnosis not present

## 2017-01-20 DIAGNOSIS — Z951 Presence of aortocoronary bypass graft: Secondary | ICD-10-CM | POA: Insufficient documentation

## 2017-01-20 DIAGNOSIS — Z79899 Other long term (current) drug therapy: Secondary | ICD-10-CM | POA: Insufficient documentation

## 2017-01-20 DIAGNOSIS — Z9889 Other specified postprocedural states: Secondary | ICD-10-CM | POA: Insufficient documentation

## 2017-01-20 DIAGNOSIS — Z885 Allergy status to narcotic agent status: Secondary | ICD-10-CM | POA: Diagnosis not present

## 2017-01-20 DIAGNOSIS — Z8249 Family history of ischemic heart disease and other diseases of the circulatory system: Secondary | ICD-10-CM | POA: Insufficient documentation

## 2017-01-20 DIAGNOSIS — G4733 Obstructive sleep apnea (adult) (pediatric): Secondary | ICD-10-CM | POA: Insufficient documentation

## 2017-01-20 DIAGNOSIS — I2510 Atherosclerotic heart disease of native coronary artery without angina pectoris: Secondary | ICD-10-CM | POA: Insufficient documentation

## 2017-01-20 DIAGNOSIS — Z833 Family history of diabetes mellitus: Secondary | ICD-10-CM | POA: Diagnosis not present

## 2017-01-20 DIAGNOSIS — J45909 Unspecified asthma, uncomplicated: Secondary | ICD-10-CM | POA: Diagnosis not present

## 2017-01-20 DIAGNOSIS — I272 Pulmonary hypertension, unspecified: Secondary | ICD-10-CM | POA: Diagnosis not present

## 2017-01-20 DIAGNOSIS — Z803 Family history of malignant neoplasm of breast: Secondary | ICD-10-CM | POA: Diagnosis not present

## 2017-01-20 DIAGNOSIS — I872 Venous insufficiency (chronic) (peripheral): Secondary | ICD-10-CM | POA: Insufficient documentation

## 2017-01-20 DIAGNOSIS — M109 Gout, unspecified: Secondary | ICD-10-CM | POA: Insufficient documentation

## 2017-01-20 DIAGNOSIS — Z794 Long term (current) use of insulin: Secondary | ICD-10-CM | POA: Insufficient documentation

## 2017-01-20 DIAGNOSIS — Z808 Family history of malignant neoplasm of other organs or systems: Secondary | ICD-10-CM | POA: Insufficient documentation

## 2017-01-20 DIAGNOSIS — E669 Obesity, unspecified: Secondary | ICD-10-CM | POA: Diagnosis not present

## 2017-01-20 DIAGNOSIS — I10 Essential (primary) hypertension: Secondary | ICD-10-CM

## 2017-01-20 LAB — BASIC METABOLIC PANEL
ANION GAP: 5 (ref 5–15)
BUN: 18 mg/dL (ref 6–20)
CO2: 27 mmol/L (ref 22–32)
CREATININE: 1.19 mg/dL (ref 0.61–1.24)
Calcium: 9 mg/dL (ref 8.9–10.3)
Chloride: 108 mmol/L (ref 101–111)
GFR calc Af Amer: >60 mL/min (ref >60)
GFR calc non Af Amer: >60 mL/min (ref >60)
GLUCOSE: 101 mg/dL — AB (ref 65–99)
Potassium: 3.8 mmol/L (ref 3.5–5.1)
Sodium: 140 mmol/L (ref 135–145)

## 2017-01-20 LAB — BRAIN NATRIURETIC PEPTIDE: B Natriuretic Peptide: 363 pg/mL — ABNORMAL HIGH (ref 0.0–100.0)

## 2017-01-20 MED ORDER — FUROSEMIDE 10 MG/ML IJ SOLN
80.0000 mg | Freq: Once | INTRAMUSCULAR | Status: AC
  Administered 2017-01-20: 80 mg via INTRAVENOUS

## 2017-01-20 MED ORDER — POTASSIUM CHLORIDE CRYS ER 20 MEQ PO TBCR: 40.0000 meq | EXTENDED_RELEASE_TABLET | Freq: Once | ORAL | Status: DC

## 2017-01-20 MED ORDER — POTASSIUM CHLORIDE CRYS ER 20 MEQ PO TBCR
EXTENDED_RELEASE_TABLET | ORAL | Status: AC
  Filled 2017-01-20: qty 2

## 2017-01-20 MED ORDER — SODIUM CHLORIDE 0.9 % IJ SOLN
INTRAMUSCULAR | Status: AC
  Filled 2017-01-20: qty 10

## 2017-01-20 MED ORDER — FUROSEMIDE 10 MG/ML IJ SOLN
INTRAMUSCULAR | Status: AC
  Administered 2017-01-20: 80 mg via INTRAVENOUS
  Filled 2017-01-20: qty 8

## 2017-01-20 MED ORDER — FUROSEMIDE 40 MG PO TABS: 40.0000 mg | ORAL_TABLET | Freq: Two times a day (BID) | ORAL | 3 refills | Status: DC

## 2017-01-20 NOTE — Patient Instructions (Signed)
Continue weighing daily and call for an overnight weight gain of > 2 pounds or a weekly weight gain of >5 pounds. 

## 2017-01-20 NOTE — Progress Notes (Signed)
Patient ID: [name], male    DOB: 1960-03-27, 56 y.o.   MRN: [id number]  HPI  [name] is a 56 y/o with a history of asthma, atrial fibrillation, CAD (CABG), chronic venous stastis of lower legs, depression, DM, renal disease (s/p renal transplant), GERD, HTN, hyperlipidemia, obstructive sleep apnea (with oxygen), remote tobacco use and chronic heart failure.   Echo done 10/16/16 showed an EF of 45-50% along with normal pulmonary artery systolic pressure. EF has improved from 35-40% back in July 2016. Right cardiac catheterization done 11/15/16 showed severely elevated filling pressure, severe pulmonary HTN and low normal cardiac output. Pulmonary wedge pressure still elevated at 31 mmHg.   Admitted 11/11/16 with HF exacerbation. Had a 22 pound weight gain prior to admission. Initially needed IV diuretics and then an IV lasix drip with metolazone. Cardiology and nephrology consult obtained. Cardiac catheterization done. Discharged home after 5 days.   He presents today for his follow-up visit with a chief complaint of abdominal distention. He describes this as chronic having been present for several years but has worsened considerably over the last few days. He has associated fatigue, cough, shortness of breath, pedal edema, weight gain and difficulty sleeping. He denies any chest pain, palpitations or dizziness.   Past Medical History:  Diagnosis Date  . Allergy    seasonal  . Amnestic MCI (mild cognitive impairment with memory loss)   . Asthma   . Atrial fibrillation ([place])   . Atrial fibrillation ([place]) 2016  . Benign neoplasm of colon   . CAD (coronary artery disease) 2005   had CABG  . CHF (congestive heart failure) ([place])   . Chronic venous stasis dermatitis of both lower extremities   . Cytomegaloviral disease ([place]) 2017  . Depression   . Diabetes mellitus   . Diverticulosis   . Dyspnea   . Erectile dysfunction   . ESRD (end stage renal disease) ([place]) 2005   Dialysis then  renal transplant  . GERD (gastroesophageal reflux disease)   . Gout   . Hyperlipidemia    low since renal failure  . Hypertension   . Kidney transplant status, cadaveric 2012  . Obesity   . Pneumonia   . Purpura ([place])   . Sleep apnea    bipap with oxygen 2 l  . Trigger finger   . Ulcer 05/2016   Left shin  . Wears glasses    Past Surgical History:  Procedure Laterality Date  . BARIATRIC SURGERY    . CARDIAC CATHETERIZATION     [facility]  . COLONOSCOPY WITH PROPOFOL N/A 04/22/2013   Procedure: COLONOSCOPY WITH PROPOFOL;  Surgeon: [name], MD;  [facility];  Service: Endoscopy;  Laterality: N/A;  . CORONARY ARTERY BYPASS GRAFT  2005   X 4  . DG ANGIO AV SHUNT*L*     right and left upper arms  . FASCIOTOMY  03/03/2012   Procedure: FASCIOTOMY;  Surgeon: [name], MD;  Location: [place];  Service: Orthopedics;  Laterality: Right;  FASCIOTOMY RIGHT SMALL FINGER  . FASCIOTOMY Left 08/17/2013   Procedure: FASCIOTOMY LEFT RING;  Surgeon: [name], MD;  Location: [place];  Service: Orthopedics;  Laterality: Left;  . INCISION AND DRAINAGE ABSCESS Left 10/15/2015   Procedure: INCISION AND DRAINAGE ABSCESS;  Surgeon: [name], MD;  Location: [facility];  Service: General;  Laterality: Left;  . KIDNEY TRANSPLANT  09/13/2010   cadaver--at [facility]  . RIGHT HEART  CATH N/A 11/15/2016   Procedure: RIGHT HEART CATH;  Surgeon: [name], MD;  Location: [place] CV [facility];  Service: Cardiovascular;  Laterality: N/A;  . TYMPANIC MEMBRANE REPAIR  1/12   left  . VASECTOMY     Family History  Problem Relation Age of Onset  . Heart disease Father   . Kidney failure Father   . Kidney disease Father   . Diabetes Maternal Grandmother   . Breast cancer Maternal Grandmother   . Valvular heart disease Mother   . Liver cancer Paternal Uncle   . Liver cancer Paternal Grandmother   . Prostate cancer Neg Hx    Social History   Substance Use Topics  . Smoking status: Former Smoker    Types: Cigars    Quit date: 09/02/1994  . Smokeless tobacco: Never Used  . Alcohol use 0.0 - 0.6 oz/week     Comment: occasional- 3 in the past year   Allergies  Allergen Reactions  . Morphine Itching   Prior to Admission medications   Medication Sig Start Date End Date Taking? Authorizing Provider  beclomethasone (QVAR) 80 MCG/ACT inhaler Inhale 2 puffs into the lungs 2 (two) times daily. 05/10/16  Yes [name], MD  buPROPion (WELLBUTRIN) 75 MG tablet Take 1 tablet (75 mg total) by mouth 2 (two) times daily. 02/21/16  Yes [name], MD  ELIQUIS 5 MG TABS tablet TAKE 1 TABLET BY MOUTH 2 TIMES DAILY 11/26/16  Yes [name], MD  insulin glargine (LANTUS) 100 unit/mL SOPN Inject 0.1 mLs (10 Units total) into the skin at bedtime. Currently using 15 units Patient taking differently: Inject 10 Units into the skin at bedtime. Currently using 10 units 11/16/16  Yes Fritzi Mandes, MD  lisinopril (PRINIVIL,ZESTRIL) 40 MG tablet TAKE 1 TABLET BY MOUTH DAILY 11/04/16  Yes [name], MD  mometasone (NASONEX) 50 MCG/ACT nasal spray PLACE 2 SPRAYS INTO THE NOSE AT BEDTIME. 11/26/16  Yes [name], MD  Multiple Vitamin (MULTIVITAMIN WITH MINERALS) TABS tablet Take 1 tablet by mouth daily.   Yes [provider]  mycophenolate (MYFORTIC) 180 MG EC tablet Take 360 mg by mouth 2 (two) times daily.    Yes [provider]  omeprazole (PRILOSEC) 20 MG capsule TAKE 1 CAPSULE BY MOUTH ONCE DAILY 02/21/16  Yes [name] I, MD  potassium chloride (MICRO-K) 10 MEQ CR capsule Take 20 mEq by mouth 2 (two) times daily.   Yes [provider]  rosuvastatin (CRESTOR) 10 MG tablet Take 1 tablet (10 mg total) by mouth daily. Patient taking differently: Take 10 mg by mouth daily. taking 02/02/16 01/20/17 Yes [name], MD  sildenafil (VIAGRA) 100 MG tablet Take 0.5-1 tablets (50-100 mg total) by  mouth daily as needed for erectile dysfunction. 01/03/17  Yes [name], MD  spironolactone (ALDACTONE) 25 MG tablet Take 1 tablet (25 mg total) by mouth daily. 11/16/16 11/16/17 Yes Fritzi Mandes, MD  sulfamethoxazole-trimethoprim (BACTRIM,SEPTRA) 400-80 MG tablet Take 1 tablet by mouth 3 (three) times a week.   Yes [provider]  tacrolimus (PROGRAF) 0.5 MG capsule Take 1 mg by mouth 2 (two) times daily.   Yes [provider]  amLODipine (NORVASC) 5 MG tablet TAKE 1 TABLET BY MOUTH DAILY 06/21/16   [name], MD  furosemide (LASIX) 40 MG tablet Take 1 tablet (40 mg total) by mouth 2 (two) times daily. Take an additional 16m in the PM as needed for weight  gain. 01/20/17 04/20/17  Alisa Graff, FNP     Review of Systems  Constitutional: Positive for fatigue. Negative for appetite change.  HENT: Negative for congestion, postnasal drip and sore throat.   Eyes: Negative.   Respiratory: Positive for cough and shortness of breath. Negative for chest tightness.   Cardiovascular: Positive for leg swelling. Negative for chest pain and palpitations.  Gastrointestinal: Positive for abdominal distention. Negative for abdominal pain.  Endocrine: Negative.   Genitourinary: Negative.   Musculoskeletal: Negative for back pain and neck pain.  Skin: Negative.   Allergic/Immunologic: Negative.   Neurological: Negative for dizziness and light-headedness.  Hematological: Negative for adenopathy. Does not bruise/bleed easily.  Psychiatric/Behavioral: Positive for sleep disturbance. Negative for dysphoric mood. The patient is not nervous/anxious.    Vitals:   01/20/17 1118  BP: (!) 162/77  Pulse: 74  Resp: 18  SpO2: 95%  Weight: 290 lb 6 oz (131.7 kg)  Height: _0  (1.803 m)   Wt Readings from Last 3 Encounters:  01/20/17 290 lb 6 oz (131.7 kg)  12/25/16 279 lb (126.6 kg)  11/19/16 268 lb 4 oz (121.7 kg)    Lab Results  Component Value Date   CREATININE 1.45  (H) 11/16/2016   CREATININE 1.44 (H) 11/15/2016   CREATININE 1.52 (H) 11/15/2016   Physical Exam  Constitutional: He is oriented to person, place, and time. He appears well-developed and well-nourished.  HENT:  Head: Normocephalic and atraumatic.  Neck: Normal range of motion. Neck supple. No JVD present.  Cardiovascular: Normal rate and regular rhythm.   Pulmonary/Chest: Effort normal. He has no wheezes. He has no rales.  Abdominal: Soft. He exhibits distension. There is no tenderness.  Musculoskeletal: He exhibits edema (1+ bilateral lower legs). He exhibits no tenderness.  Neurological: He is alert and oriented to person, place, and time.  Skin: Skin is warm and dry.  Psychiatric: He has a normal mood and affect. His behavior is normal. Thought content normal.  Nursing note and vitals reviewed.   Assessment & Plan:  1: Acute heart failure with preserved ejection fraction- - NYHA class III - moderately fluid overloaded today - already weighing daily and he says that his weight has gradually gone up. Reminded to call for an overnight weight gain of >2 pounds or a weekly weight gain of >5 pounds - weight up 22 pounds since he was last here August 2018 - ReDS vest reading done and was 54% - will send to same day surgery this afternoon for IV diuretic protocol (IV furosemide, PO potassium, BNP, BMP) - RX sent in for furosemide 68m BID and can take an additional 464min the PM as needed  - not adding salt and has been reading food labels. Discussed the importance of closely following a 200038modium diet - has been unable to exercise because he feels "so bad" - is on spironolactone and potassium - [facility] sodium 138, potassium 3.7 and GFR 58 - he will return 01/23/17 for a recheck and for repeat lab work at that time  2: HTN- - BP elevated today but he's 22 pounds heavier - saw PCP (LeSilvio Pate0/3/18  3: Atrial fibrillation- -  currently rate controlled at this time - on apixaban and amlodipine  Patient did not bring his medications nor a list. Each medication was verbally reviewed with the patient and he was encouraged to bring the bottles to every visit to confirm accuracy of list.  Return  in 3 days or sooner for any questions/problems before then.

## 2017-01-21 ENCOUNTER — Telehealth: Payer: Self-pay | Admitting: Family

## 2017-01-21 DIAGNOSIS — I509 Heart failure, unspecified: Secondary | ICD-10-CM | POA: Insufficient documentation

## 2017-01-21 NOTE — Telephone Encounter (Signed)
Patient called to update on his weight etc. He received the 42m IV lasix yesterday and says that his weight is down 7 pounds. He still has abdominal swelling and was wondering if his diuretics need to be adjusted. Reviewed labs that were drawn yesterday and renal function is normal (GFR >60).  He is currently taking furosemide 473mtwice daily. Advised him to take an additional 4018mf furosemide QPM for today and tomorrow and we will reassess on 01/23/17.  Once he gets back to his dry weight, will need to a get a BNP to establish a baseline as his current value is elevated at 363.0.

## 2017-01-23 ENCOUNTER — Encounter: Payer: Self-pay | Admitting: Family

## 2017-01-23 ENCOUNTER — Ambulatory Visit: Payer: 59 | Attending: Family | Admitting: Family

## 2017-01-23 VITALS — BP 154/77 | HR 65 | Resp 18 | Ht 71.0 in | Wt 283.2 lb

## 2017-01-23 DIAGNOSIS — Z940 Kidney transplant status: Secondary | ICD-10-CM | POA: Diagnosis not present

## 2017-01-23 DIAGNOSIS — E1121 Type 2 diabetes mellitus with diabetic nephropathy: Secondary | ICD-10-CM

## 2017-01-23 DIAGNOSIS — F329 Major depressive disorder, single episode, unspecified: Secondary | ICD-10-CM | POA: Diagnosis not present

## 2017-01-23 DIAGNOSIS — Z794 Long term (current) use of insulin: Secondary | ICD-10-CM | POA: Diagnosis not present

## 2017-01-23 DIAGNOSIS — G4733 Obstructive sleep apnea (adult) (pediatric): Secondary | ICD-10-CM | POA: Diagnosis not present

## 2017-01-23 DIAGNOSIS — E1122 Type 2 diabetes mellitus with diabetic chronic kidney disease: Secondary | ICD-10-CM | POA: Insufficient documentation

## 2017-01-23 DIAGNOSIS — I5031 Acute diastolic (congestive) heart failure: Secondary | ICD-10-CM | POA: Diagnosis not present

## 2017-01-23 DIAGNOSIS — Z7901 Long term (current) use of anticoagulants: Secondary | ICD-10-CM | POA: Diagnosis not present

## 2017-01-23 DIAGNOSIS — Z87891 Personal history of nicotine dependence: Secondary | ICD-10-CM | POA: Insufficient documentation

## 2017-01-23 DIAGNOSIS — Z79899 Other long term (current) drug therapy: Secondary | ICD-10-CM | POA: Insufficient documentation

## 2017-01-23 DIAGNOSIS — I2510 Atherosclerotic heart disease of native coronary artery without angina pectoris: Secondary | ICD-10-CM | POA: Diagnosis not present

## 2017-01-23 DIAGNOSIS — I872 Venous insufficiency (chronic) (peripheral): Secondary | ICD-10-CM | POA: Diagnosis not present

## 2017-01-23 DIAGNOSIS — G3184 Mild cognitive impairment, so stated: Secondary | ICD-10-CM | POA: Diagnosis not present

## 2017-01-23 DIAGNOSIS — R600 Localized edema: Secondary | ICD-10-CM | POA: Insufficient documentation

## 2017-01-23 DIAGNOSIS — K219 Gastro-esophageal reflux disease without esophagitis: Secondary | ICD-10-CM | POA: Insufficient documentation

## 2017-01-23 DIAGNOSIS — I4891 Unspecified atrial fibrillation: Secondary | ICD-10-CM | POA: Diagnosis not present

## 2017-01-23 DIAGNOSIS — Z9889 Other specified postprocedural states: Secondary | ICD-10-CM | POA: Insufficient documentation

## 2017-01-23 DIAGNOSIS — I10 Essential (primary) hypertension: Secondary | ICD-10-CM

## 2017-01-23 DIAGNOSIS — Z951 Presence of aortocoronary bypass graft: Secondary | ICD-10-CM | POA: Insufficient documentation

## 2017-01-23 DIAGNOSIS — I132 Hypertensive heart and chronic kidney disease with heart failure and with stage 5 chronic kidney disease, or end stage renal disease: Secondary | ICD-10-CM | POA: Insufficient documentation

## 2017-01-23 DIAGNOSIS — E785 Hyperlipidemia, unspecified: Secondary | ICD-10-CM | POA: Diagnosis not present

## 2017-01-23 DIAGNOSIS — J45909 Unspecified asthma, uncomplicated: Secondary | ICD-10-CM | POA: Insufficient documentation

## 2017-01-23 DIAGNOSIS — M109 Gout, unspecified: Secondary | ICD-10-CM | POA: Insufficient documentation

## 2017-01-23 DIAGNOSIS — N186 End stage renal disease: Secondary | ICD-10-CM | POA: Insufficient documentation

## 2017-01-23 MED ORDER — METOLAZONE 5 MG PO TABS: 5.0000 mg | ORAL_TABLET | Freq: Every day | ORAL | 0 refills | Status: DC

## 2017-01-23 NOTE — Progress Notes (Signed)
Patient ID: [name], male    DOB: Nov 13, 1960, 56 y.o.   MRN: [id number]  HPI  Mr Zingg is a 56 y/o with a history of asthma, atrial fibrillation, CAD (CABG), chronic venous stastis of lower legs, depression, DM, renal disease (s/p renal transplant), GERD, HTN, hyperlipidemia, obstructive sleep apnea (with oxygen), remote tobacco use and chronic heart failure.   Echo done 10/16/16 showed an EF of 45-50% along with normal pulmonary artery systolic pressure. EF has improved from 35-40% back in July 2016. Right cardiac catheterization done 11/15/16 showed severely elevated filling pressure, severe pulmonary HTN and low normal cardiac output. Pulmonary wedge pressure still elevated at 31 mmHg.   Admitted 11/11/16 with HF exacerbation. Had a 22 pound weight gain prior to admission. Initially needed IV diuretics and then an IV lasix drip with metolazone. Cardiology and nephrology consult obtained. Cardiac catheterization done. Discharged home after 5 days.   He presents today for his follow-up visit with a chief complaint of continued edema in his legs/abdomen. Has had edema intermittently for years with varying levels of severity. Does feel like it's improved slightly since getting the IV furosemide but is still well above his dry weight. Has associated fatigue, cough, shortness of breath and difficulty sleeping. Denies any chest pain or dizziness.   Past Medical History:  Diagnosis Date  . Allergy    seasonal  . Amnestic MCI (mild cognitive impairment with memory loss)   . Asthma   . Atrial fibrillation ([place])   . Atrial fibrillation ([place]) 2016  . Benign neoplasm of colon   . CAD (coronary artery disease) 2005   had CABG  . CHF (congestive heart failure) ([place])   . Chronic venous stasis dermatitis of both lower extremities   . Cytomegaloviral disease ([place]) 2017  . Depression   . Diabetes mellitus   . Diverticulosis   . Dyspnea   . Erectile dysfunction   . ESRD (end stage renal disease)  ([place]) 2005   Dialysis then renal transplant  . GERD (gastroesophageal reflux disease)   . Gout   . Hyperlipidemia    low since renal failure  . Hypertension   . Kidney transplant status, cadaveric 2012  . Obesity   . Pneumonia   . Purpura ([place])   . Sleep apnea    bipap with oxygen 2 l  . Trigger finger   . Ulcer 05/2016   Left shin  . Wears glasses    Past Surgical History:  Procedure Laterality Date  . BARIATRIC SURGERY    . CARDIAC CATHETERIZATION     [facility]  . COLONOSCOPY WITH PROPOFOL N/A 04/22/2013   Procedure: COLONOSCOPY WITH PROPOFOL;  Surgeon: [name], MD;  Location: [facility];  Service: Endoscopy;  Laterality: N/A;  . CORONARY ARTERY BYPASS GRAFT  2005   X 4  . DG ANGIO AV SHUNT*L*     right and left upper arms  . FASCIOTOMY  03/03/2012   Procedure: FASCIOTOMY;  Surgeon: [name], MD;  Location: [facility];  Service: Orthopedics;  Laterality: Right;  FASCIOTOMY RIGHT SMALL FINGER  . FASCIOTOMY Left 08/17/2013   Procedure: FASCIOTOMY LEFT RING;  Surgeon: [name], MD;  Location: [facility];  Service: Orthopedics;  Laterality: Left;  . INCISION AND DRAINAGE ABSCESS Left 10/15/2015   Procedure: INCISION AND DRAINAGE ABSCESS;  Surgeon: [name], MD;  Location: [facility];  Service: General;  Laterality: Left;  . KIDNEY TRANSPLANT  09/13/2010  cadaver--at [facility]  . RIGHT HEART CATH N/A 11/15/2016   Procedure: RIGHT HEART CATH;  Surgeon: [name], MD;  Location: [facility];  Service: Cardiovascular;  Laterality: N/A;  . TYMPANIC MEMBRANE REPAIR  1/12   left  . VASECTOMY     Family History  Problem Relation Age of Onset  . Heart disease Father   . Kidney failure Father   . Kidney disease Father   . Diabetes Maternal Grandmother   . Breast cancer Maternal Grandmother   . Valvular heart disease Mother   . Liver cancer Paternal Uncle   . Liver cancer Paternal Grandmother   . Prostate cancer Neg Hx     Social History  Substance Use Topics  . Smoking status: Former Smoker    Types: Cigars    Quit date: 09/02/1994  . Smokeless tobacco: Never Used  . Alcohol use 0.0 - 0.6 oz/week     Comment: occasional- 3 in the past year   Allergies  Allergen Reactions  . Morphine Itching    Prior to Admission medications   Medication Sig Start Date End Date Taking? Authorizing Provider  amLODipine (NORVASC) 5 MG tablet TAKE 1 TABLET BY MOUTH DAILY 06/21/16  Yes [name], MD  beclomethasone (QVAR) 80 MCG/ACT inhaler Inhale 2 puffs into the lungs 2 (two) times daily. 05/10/16  Yes [name], MD  buPROPion (WELLBUTRIN) 75 MG tablet Take 1 tablet (75 mg total) by mouth 2 (two) times daily. 02/21/16  Yes [name], MD  ELIQUIS 5 MG TABS tablet TAKE 1 TABLET BY MOUTH 2 TIMES DAILY 11/26/16  Yes [name], MD  furosemide (LASIX) 40 MG tablet Take 1 tablet (40 mg total) by mouth 2 (two) times daily. Take an additional 45m in the PM as needed for weight gain. 01/20/17 04/20/17 Yes Marybel Alcott, [name], FNP  insulin glargine (LANTUS) 100 unit/mL SOPN Inject 0.1 mLs (10 Units total) into the skin at bedtime. Currently using 15 units Patient taking differently: Inject 10 Units into the skin at bedtime. Currently using 10 units 11/16/16  Yes [name] MD  lisinopril (PRINIVIL,ZESTRIL) 40 MG tablet TAKE 1 TABLET BY MOUTH DAILY 11/04/16  Yes AWellington Hampshire MD  mometasone (NASONEX) 50 MCG/ACT nasal spray PLACE 2 SPRAYS INTO THE NOSE AT BEDTIME. 11/26/16  Yes LVenia Carbon MD  Multiple Vitamin (MULTIVITAMIN WITH MINERALS) TABS tablet Take 1 tablet by mouth daily.   Yes [provider]  mycophenolate (MYFORTIC) 180 MG EC tablet Take 360 mg by mouth 2 (two) times daily.    Yes [provider]  omeprazole (PRILOSEC) 20 MG capsule TAKE 1 CAPSULE BY MOUTH ONCE DAILY 02/21/16  Yes LViviana SimplerI, MD  potassium chloride (MICRO-K) 10 MEQ CR capsule Take 20 mEq by mouth 2  (two) times daily.   Yes [provider]  sildenafil (VIAGRA) 100 MG tablet Take 0.5-1 tablets (50-100 mg total) by mouth daily as needed for erectile dysfunction. 01/03/17  Yes LVenia Carbon MD  spironolactone (ALDACTONE) 25 MG tablet Take 1 tablet (25 mg total) by mouth daily. 11/16/16 11/16/17 Yes [name] MD  sulfamethoxazole-trimethoprim (BACTRIM,SEPTRA) 400-80 MG tablet Take 1 tablet by mouth 3 (three) times a week.   Yes [provider]  tacrolimus (PROGRAF) 0.5 MG capsule Take 1 mg by mouth 2 (two) times daily.   Yes [provider]  rosuvastatin (CRESTOR) 10 MG tablet Take 1 tablet (10 mg total) by mouth daily. Patient taking  differently: Take 10 mg by mouth daily. taking 02/02/16 01/20/17  [name], MD   Review of Systems  Constitutional: Positive for fatigue. Negative for appetite change.  HENT: Negative for congestion, postnasal drip and sore throat.   Eyes: Negative.   Respiratory: Positive for cough and shortness of breath. Negative for chest tightness.   Cardiovascular: Positive for leg swelling. Negative for chest pain and palpitations.  Gastrointestinal: Positive for abdominal distention ("better"). Negative for abdominal pain.  Endocrine: Negative.   Genitourinary: Negative.   Musculoskeletal: Negative for back pain and neck pain.  Skin: Negative.   Allergic/Immunologic: Negative.   Neurological: Negative for dizziness and light-headedness.  Hematological: Negative for adenopathy. Does not bruise/bleed easily.  Psychiatric/Behavioral: Positive for sleep disturbance. Negative for dysphoric mood. The patient is not nervous/anxious.    Vitals:   01/23/17 1254  BP: (!) 154/77  Pulse: 65  Resp: 18  SpO2: 100%  Weight: 283 lb 4 oz (128.5 kg)  Height: _0  (1.803 m)   Wt Readings from Last 3 Encounters:  01/23/17 283 lb 4 oz (128.5 kg)  01/20/17 290 lb 6 oz (131.7 kg)  12/25/16 279 lb (126.6 kg)    Lab Results  Component  Value Date   CREATININE 1.19 01/20/2017   CREATININE 1.45 (H) 11/16/2016   CREATININE 1.44 (H) 11/15/2016   Physical Exam  Constitutional: He is oriented to person, place, and time. He appears well-developed and well-nourished.  HENT:  Head: Normocephalic and atraumatic.  Neck: Normal range of motion. Neck supple. No JVD present.  Cardiovascular: Normal rate and regular rhythm.   Pulmonary/Chest: Effort normal. He has no wheezes. He has no rales.  Abdominal: Soft. He exhibits distension (softer). There is no tenderness.  Musculoskeletal: He exhibits edema (2+ bilateral lower legs). He exhibits no tenderness.  Neurological: He is alert and oriented to person, place, and time.  Skin: Skin is warm and dry.  Psychiatric: He has a normal mood and affect. His behavior is normal. Thought content normal.  Nursing note and vitals reviewed.   Assessment & Plan:  1: Acute heart failure with preserved ejection fraction- - NYHA class III - remains fluid overloaded today - already weighing daily and he says that he's plateaud at 281 pounds. Reminded to call for an overnight weight gain of >2 pounds or a weekly weight gain of >5 pounds - weight down 7 pounds since he was last here a few days ago - received IV furosemide 92m on 01/20/17 - not adding salt and has been reading food labels. Discussed the importance of closely following a 20050msodium diet - has been unable to exercise due to symptoms - is on spironolactone and potassium - BMP from 01/20/17 reviewed and shows sodium 140, potassium 3.8 and GFR >60 - will add 10m41metolazone daily for up to the next 5 days. Instructed him to take it 1/2 hour prior to furosemide if possible and to take an additional potassium tablet every day that he takes the metolazone - should he respond dramatically to the metolazone, he's to stop taking it or skip a day and he verbalizes understanding - will not check a BMP today since most recent labs looked great  but will check one next week when he returns  2: HTN- - BP better - saw PCP (LeSilvio Pate0/3/18  3: Diabetes- - patient reports glucose at home this morning was 100  Patient did not bring his medications nor a list. Each medication was verbally reviewed with the  patient and he was encouraged to bring the bottles to every visit to confirm accuracy of list.  Return in 1 one week for recheck of symptoms and for lab work

## 2017-01-23 NOTE — Patient Instructions (Addendum)
Continue weighing daily and call for an overnight weight gain of > 2 pounds or a weekly weight gain of >5 pounds.  Take metolazone daily for up to the next 5 days. Best to take it 1/2 hour prior to your furosemide if able. If you drop a substantial amount of weight, stop taking the metolazone or skip a day.   While taking the metolazone, take an additional potassium tablet every day as well.

## 2017-01-25 DIAGNOSIS — E662 Morbid (severe) obesity with alveolar hypoventilation: Secondary | ICD-10-CM | POA: Diagnosis not present

## 2017-01-25 DIAGNOSIS — G4733 Obstructive sleep apnea (adult) (pediatric): Secondary | ICD-10-CM | POA: Diagnosis not present

## 2017-01-25 DIAGNOSIS — I482 Chronic atrial fibrillation: Secondary | ICD-10-CM | POA: Diagnosis not present

## 2017-01-29 ENCOUNTER — Other Ambulatory Visit
Admission: RE | Admit: 2017-01-29 | Discharge: 2017-01-29 | Disposition: A | Discharge status: Home / Self Care | Payer: Medicare Other | Source: Ambulatory Visit | Attending: Family | Admitting: Family

## 2017-01-29 ENCOUNTER — Encounter: Payer: Self-pay | Admitting: Family

## 2017-01-29 ENCOUNTER — Ambulatory Visit: Payer: 59 | Attending: Family | Admitting: Family

## 2017-01-29 ENCOUNTER — Other Ambulatory Visit: Payer: Self-pay

## 2017-01-29 ENCOUNTER — Telehealth: Payer: Self-pay | Admitting: Family

## 2017-01-29 VITALS — BP 153/75 | HR 64 | Resp 18 | Ht 71.0 in | Wt 266.5 lb

## 2017-01-29 DIAGNOSIS — E1122 Type 2 diabetes mellitus with diabetic chronic kidney disease: Secondary | ICD-10-CM | POA: Insufficient documentation

## 2017-01-29 DIAGNOSIS — G3184 Mild cognitive impairment, so stated: Secondary | ICD-10-CM | POA: Insufficient documentation

## 2017-01-29 DIAGNOSIS — I5032 Chronic diastolic (congestive) heart failure: Secondary | ICD-10-CM | POA: Insufficient documentation

## 2017-01-29 DIAGNOSIS — Z803 Family history of malignant neoplasm of breast: Secondary | ICD-10-CM | POA: Diagnosis not present

## 2017-01-29 DIAGNOSIS — Z841 Family history of disorders of kidney and ureter: Secondary | ICD-10-CM | POA: Insufficient documentation

## 2017-01-29 DIAGNOSIS — Z800 Family history of malignant neoplasm of digestive organs: Secondary | ICD-10-CM | POA: Insufficient documentation

## 2017-01-29 DIAGNOSIS — I132 Hypertensive heart and chronic kidney disease with heart failure and with stage 5 chronic kidney disease, or end stage renal disease: Secondary | ICD-10-CM | POA: Diagnosis not present

## 2017-01-29 DIAGNOSIS — Z9889 Other specified postprocedural states: Secondary | ICD-10-CM | POA: Diagnosis not present

## 2017-01-29 DIAGNOSIS — I872 Venous insufficiency (chronic) (peripheral): Secondary | ICD-10-CM | POA: Diagnosis not present

## 2017-01-29 DIAGNOSIS — E669 Obesity, unspecified: Secondary | ICD-10-CM | POA: Diagnosis not present

## 2017-01-29 DIAGNOSIS — I2510 Atherosclerotic heart disease of native coronary artery without angina pectoris: Secondary | ICD-10-CM | POA: Diagnosis not present

## 2017-01-29 DIAGNOSIS — Z940 Kidney transplant status: Secondary | ICD-10-CM | POA: Insufficient documentation

## 2017-01-29 DIAGNOSIS — Z7901 Long term (current) use of anticoagulants: Secondary | ICD-10-CM | POA: Insufficient documentation

## 2017-01-29 DIAGNOSIS — Z951 Presence of aortocoronary bypass graft: Secondary | ICD-10-CM | POA: Insufficient documentation

## 2017-01-29 DIAGNOSIS — N186 End stage renal disease: Secondary | ICD-10-CM | POA: Insufficient documentation

## 2017-01-29 DIAGNOSIS — I272 Pulmonary hypertension, unspecified: Secondary | ICD-10-CM | POA: Diagnosis not present

## 2017-01-29 DIAGNOSIS — Z992 Dependence on renal dialysis: Secondary | ICD-10-CM | POA: Diagnosis not present

## 2017-01-29 DIAGNOSIS — J45909 Unspecified asthma, uncomplicated: Secondary | ICD-10-CM | POA: Insufficient documentation

## 2017-01-29 DIAGNOSIS — F329 Major depressive disorder, single episode, unspecified: Secondary | ICD-10-CM | POA: Insufficient documentation

## 2017-01-29 DIAGNOSIS — I4891 Unspecified atrial fibrillation: Secondary | ICD-10-CM | POA: Insufficient documentation

## 2017-01-29 DIAGNOSIS — Z87891 Personal history of nicotine dependence: Secondary | ICD-10-CM | POA: Insufficient documentation

## 2017-01-29 DIAGNOSIS — Z794 Long term (current) use of insulin: Secondary | ICD-10-CM | POA: Insufficient documentation

## 2017-01-29 DIAGNOSIS — R634 Abnormal weight loss: Secondary | ICD-10-CM | POA: Diagnosis not present

## 2017-01-29 DIAGNOSIS — I10 Essential (primary) hypertension: Secondary | ICD-10-CM

## 2017-01-29 DIAGNOSIS — G4733 Obstructive sleep apnea (adult) (pediatric): Secondary | ICD-10-CM | POA: Diagnosis not present

## 2017-01-29 DIAGNOSIS — Z79899 Other long term (current) drug therapy: Secondary | ICD-10-CM | POA: Insufficient documentation

## 2017-01-29 DIAGNOSIS — M109 Gout, unspecified: Secondary | ICD-10-CM | POA: Diagnosis not present

## 2017-01-29 DIAGNOSIS — E785 Hyperlipidemia, unspecified: Secondary | ICD-10-CM | POA: Insufficient documentation

## 2017-01-29 DIAGNOSIS — E1121 Type 2 diabetes mellitus with diabetic nephropathy: Secondary | ICD-10-CM

## 2017-01-29 DIAGNOSIS — Z888 Allergy status to other drugs, medicaments and biological substances status: Secondary | ICD-10-CM | POA: Insufficient documentation

## 2017-01-29 DIAGNOSIS — Z8249 Family history of ischemic heart disease and other diseases of the circulatory system: Secondary | ICD-10-CM | POA: Diagnosis not present

## 2017-01-29 LAB — BASIC METABOLIC PANEL
Anion gap: 9 (ref 5–15)
BUN: 32 mg/dL — ABNORMAL HIGH (ref 6–20)
CHLORIDE: 98 mmol/L — AB (ref 101–111)
CO2: 30 mmol/L (ref 22–32)
Calcium: 9.5 mg/dL (ref 8.9–10.3)
Creatinine, Ser: 1.21 mg/dL (ref 0.61–1.24)
Glucose, Bld: 125 mg/dL — ABNORMAL HIGH (ref 65–99)
POTASSIUM: 3.3 mmol/L — AB (ref 3.5–5.1)
SODIUM: 137 mmol/L (ref 135–145)

## 2017-01-29 NOTE — Telephone Encounter (Signed)
LM on patient's voicemail regarding lab work that was done today (01/29/17). Renal function is stable with a creatinine of 1.21 and GFR >60. Potassium has dropped to 3.3 with taking 2 days of metolazone. He is currently taking 34mq potassium BID along with spironolactone 269mdaily.   Advised patient to take an additional 2031mpotassium for the next 3 days and if he takes any more metolazone, he's to take an additional 14m32motassium each time he takes the metolazone. (he takes the 10me58mtassium capsules)

## 2017-01-29 NOTE — Progress Notes (Signed)
Patient ID: [name], male    DOB: 1960/06/11, 56 y.o.   MRN: [id number]  HPI  [name] is a 56 y/o with a history of asthma, atrial fibrillation, CAD (CABG), chronic venous stastis of lower legs, depression, DM, renal disease (s/p renal transplant), GERD, HTN, hyperlipidemia, obstructive sleep apnea (with oxygen), remote tobacco use and chronic heart failure.   Echo done 10/16/16 showed an EF of 45-50% along with normal pulmonary artery systolic pressure. EF has improved from 35-40% back in July 2016. Right cardiac catheterization done 11/15/16 showed severely elevated filling pressure, severe pulmonary HTN and low normal cardiac output. Pulmonary wedge pressure still elevated at 31 mmHg.   Admitted 11/11/16 with HF exacerbation. Had a 22 pound weight gain prior to admission. Initially needed IV diuretics and then an IV lasix drip with metolazone. Cardiology and nephrology consult obtained. Cardiac catheterization done. Discharged home after 5 days.   He presents today for his follow-up visit with a chief complaint of edema in lower legs. He describes this as chronic in nature having been present for several years with varying levels of severity. He does feel like the edema is improving since taking some metolazone. He has associated abdominal edema but feels like that is improving as well. He denies any fatigue, chest pain, shortness of breath, palpitations, dizziness or weight gain.   Past Medical History:  Diagnosis Date  . Allergy    seasonal  . Amnestic MCI (mild cognitive impairment with memory loss)   . Asthma   . Atrial fibrillation ([place])   . Atrial fibrillation ([place]) 2016  . Benign neoplasm of colon   . CAD (coronary artery disease) 2005   had CABG  . CHF (congestive heart failure) ([place])   . Chronic venous stasis dermatitis of both lower extremities   . Cytomegaloviral disease ([place]) 2017  . Depression   . Diabetes mellitus   . Diverticulosis   . Dyspnea   . Erectile  dysfunction   . ESRD (end stage renal disease) ([place]) 2005   Dialysis then renal transplant  . GERD (gastroesophageal reflux disease)   . Gout   . Hyperlipidemia    low since renal failure  . Hypertension   . Kidney transplant status, cadaveric 2012  . Obesity   . Pneumonia   . Purpura ([place])   . Sleep apnea    bipap with oxygen 2 l  . Trigger finger   . Ulcer 05/2016   Left shin  . Wears glasses    Past Surgical History:  Procedure Laterality Date  . BARIATRIC SURGERY    . CARDIAC CATHETERIZATION     [facility]  . CORONARY ARTERY BYPASS GRAFT  2005   X 4  . DG ANGIO AV SHUNT*L*     right and left upper arms  . KIDNEY TRANSPLANT  09/13/2010   cadaver--at [facility]  1/12   left  . VASECTOMY     Family History  Problem Relation Age of Onset  . Heart disease Father   . Kidney failure Father   . Kidney disease Father   . Diabetes Maternal Grandmother   . Breast cancer Maternal Grandmother   . Valvular heart disease Mother   . Liver cancer Paternal Uncle   . Liver cancer Paternal Grandmother   . Prostate cancer Neg Hx    Social History   Tobacco Use  . Smoking status: Former Smoker    Types: Cigars    Last attempt to  quit: 09/02/1994    Years since quitting: 22.4  . Smokeless tobacco: Never Used  Substance Use Topics  . Alcohol use: Yes    Alcohol/week: 0.0 - 0.6 oz    Comment: occasional- 3 in the past year   Allergies  Allergen Reactions  . Morphine Itching   Prior to Admission medications   Medication Sig Start Date End Date Taking? Authorizing Provider  amLODipine (NORVASC) 5 MG tablet TAKE 1 TABLET BY MOUTH DAILY 06/21/16  Yes [name], MD  beclomethasone (QVAR) 80 MCG/ACT inhaler Inhale 2 puffs into the lungs 2 (two) times daily. 05/10/16  Yes [name], MD  buPROPion (WELLBUTRIN) 75 MG tablet Take 1 tablet (75 mg total) by mouth 2 (two) times daily. 02/21/16  Yes [name], MD  ELIQUIS 5 MG TABS tablet TAKE  1 TABLET BY MOUTH 2 TIMES DAILY 11/26/16  Yes [name], MD  furosemide (LASIX) 40 MG tablet Take 1 tablet (40 mg total) by mouth 2 (two) times daily. Take an additional 53m in the PM as needed for weight gain. 01/20/17 04/20/17 Yes Adrianne Shackleton, [name], FNP  insulin glargine (LANTUS) 100 unit/mL SOPN Inject 0.1 mLs (10 Units total) into the skin at bedtime. Currently using 15 units Patient taking differently: Inject 10 Units into the skin at bedtime. Currently using 10 units 11/16/16  Yes [name] MD  lisinopril (PRINIVIL,ZESTRIL) 40 MG tablet TAKE 1 TABLET BY MOUTH DAILY 11/04/16  Yes AWellington Hampshire MD  metolazone (ZAROXOLYN) 5 MG tablet Take 1 tablet (5 mg total) by mouth daily. 01/23/17 04/23/17 Yes Abhay Godbolt, TAura Fey FNP  mometasone (NASONEX) 50 MCG/ACT nasal spray PLACE 2 SPRAYS INTO THE NOSE AT BEDTIME. 11/26/16  Yes LVenia Carbon MD  Multiple Vitamin (MULTIVITAMIN WITH MINERALS) TABS tablet Take 1 tablet by mouth daily.   Yes [provider]  mycophenolate (MYFORTIC) 180 MG EC tablet Take 360 mg by mouth 2 (two) times daily.    Yes [provider]  omeprazole (PRILOSEC) 20 MG capsule TAKE 1 CAPSULE BY MOUTH ONCE DAILY 02/21/16  Yes LViviana SimplerI, MD  potassium chloride (MICRO-K) 10 MEQ CR capsule Take 20 mEq by mouth 2 (two) times daily.   Yes [provider]  rosuvastatin (CRESTOR) 10 MG tablet Take 1 tablet (10 mg total) by mouth daily. Patient taking differently: Take 10 mg by mouth daily. taking 02/02/16 01/29/17 Yes AWellington Hampshire MD  sildenafil (VIAGRA) 100 MG tablet Take 0.5-1 tablets (50-100 mg total) by mouth daily as needed for erectile dysfunction. 01/03/17  Yes LVenia Carbon MD  spironolactone (ALDACTONE) 25 MG tablet Take 1 tablet (25 mg total) by mouth daily. 11/16/16 11/16/17 Yes [name] MD  sulfamethoxazole-trimethoprim (BACTRIM,SEPTRA) 400-80 MG tablet Take 1 tablet by mouth 3 (three) times a week.   Yes [provider]   tacrolimus (PROGRAF) 0.5 MG capsule Take 1 mg by mouth 2 (two) times daily.   Yes [provider]   Review of Systems  Constitutional: Negative for appetite change and fatigue.  HENT: Negative for congestion, postnasal drip and sore throat.   Eyes: Negative.   Respiratory: Negative for cough, chest tightness and shortness of breath.   Cardiovascular: Positive for leg swelling (better). Negative for chest pain and palpitations.  Gastrointestinal: Positive for abdominal distention ("better"). Negative for abdominal pain.  Endocrine: Negative.   Genitourinary: Negative.   Musculoskeletal: Negative for back pain and neck pain.  Skin: Negative.   Allergic/Immunologic: Negative.  Neurological: Negative for dizziness and light-headedness.  Hematological: Negative for adenopathy. Does not bruise/bleed easily.  Psychiatric/Behavioral: Positive for sleep disturbance. Negative for dysphoric mood. The patient is not nervous/anxious.    Vitals:   01/29/17 0819  BP: (!) 153/75  Pulse: 64  Resp: 18  SpO2: 100%  Weight: 266 lb 8 oz (120.9 kg)  Height: _0  (1.803 m)   Wt Readings from Last 3 Encounters:  01/29/17 266 lb 8 oz (120.9 kg)  01/23/17 283 lb 4 oz (128.5 kg)  01/20/17 290 lb 6 oz (131.7 kg)    Lab Results  Component Value Date   CREATININE 1.19 01/20/2017   CREATININE 1.45 (H) 11/16/2016   CREATININE 1.44 (H) 11/15/2016   Physical Exam  Constitutional: He is oriented to person, place, and time. He appears well-developed and well-nourished.  HENT:  Head: Normocephalic and atraumatic.  Neck: Normal range of motion. Neck supple. No JVD present.  Cardiovascular: Normal rate. An irregular rhythm present.  Pulmonary/Chest: Effort normal. He has no wheezes. He has no rales.  Abdominal: Soft. He exhibits distension (softer). There is no tenderness.  Musculoskeletal: He exhibits edema (1+ bilateral lower legs). He exhibits no tenderness.  Neurological: He is alert and  oriented to person, place, and time.  Skin: Skin is warm and dry.  Psychiatric: He has a normal mood and affect. His behavior is normal. Thought content normal.  Nursing note and vitals reviewed.   Assessment & Plan:  1: Chronic heart failure with preserved ejection fraction- - NYHA class I - mildly fluid overloaded today - continues to weigh daily and notes a gradual weight loss. Reminded to call for an overnight weight gain of >2 pounds or a weekly weight gain of >5 pounds; he feels like his dry weight is close to 156 pounds - weight down 16.6 pounds since he was last here a week ago - received IV furosemide 59m on 01/20/17 - not adding salt and has been reading food labels. Discussed the importance of closely following a 20066msodium diet - low sodium cookbook given to him today - ReDS vest reading was 50% today (previous reading was 54%) - has resumed going to the gym - is on spironolactone and potassium - BMP from 01/20/17 reviewed and shows sodium 140, potassium 3.8 and GFR >60 - has taken 2 doses of 77m2metolazone in the last week; may need PRN metolazone - BMP drawn today  2: HTN- - BP better - saw PCP (LeSilvio Pate0/3/18  3: Diabetes- - patient reports glucose at home this morning was 89 - A1c on 10/09/16 reviewed and was 6.1%  Patient did not bring his medications nor a list. Each medication was verbally reviewed with the patient and he was encouraged to bring the bottles to every visit to confirm accuracy of list.  Return in 1 week or sooner for any questions/problems before then.

## 2017-01-29 NOTE — Patient Instructions (Signed)
Continue weighing daily and call for an overnight weight gain of > 2 pounds or a weekly weight gain of >5 pounds. 

## 2017-02-03 ENCOUNTER — Ambulatory Visit: Payer: Self-pay

## 2017-02-05 ENCOUNTER — Encounter: Payer: Self-pay | Admitting: Family

## 2017-02-05 ENCOUNTER — Ambulatory Visit: Payer: 59 | Attending: Family | Admitting: Family

## 2017-02-05 ENCOUNTER — Telehealth: Payer: Self-pay | Admitting: Family

## 2017-02-05 ENCOUNTER — Other Ambulatory Visit: Payer: Self-pay

## 2017-02-05 ENCOUNTER — Other Ambulatory Visit
Admission: RE | Admit: 2017-02-05 | Discharge: 2017-02-05 | Disposition: A | Discharge status: Home / Self Care | Payer: Medicare Other | Source: Ambulatory Visit | Attending: Family | Admitting: Family

## 2017-02-05 VITALS — BP 151/79 | HR 52 | Resp 20 | Ht 71.0 in | Wt 267.1 lb

## 2017-02-05 DIAGNOSIS — Z87891 Personal history of nicotine dependence: Secondary | ICD-10-CM | POA: Diagnosis not present

## 2017-02-05 DIAGNOSIS — Z8249 Family history of ischemic heart disease and other diseases of the circulatory system: Secondary | ICD-10-CM | POA: Diagnosis not present

## 2017-02-05 DIAGNOSIS — Z833 Family history of diabetes mellitus: Secondary | ICD-10-CM | POA: Diagnosis not present

## 2017-02-05 DIAGNOSIS — Z9889 Other specified postprocedural states: Secondary | ICD-10-CM | POA: Diagnosis not present

## 2017-02-05 DIAGNOSIS — Z803 Family history of malignant neoplasm of breast: Secondary | ICD-10-CM | POA: Diagnosis not present

## 2017-02-05 DIAGNOSIS — I5032 Chronic diastolic (congestive) heart failure: Secondary | ICD-10-CM

## 2017-02-05 DIAGNOSIS — Z794 Long term (current) use of insulin: Secondary | ICD-10-CM | POA: Insufficient documentation

## 2017-02-05 DIAGNOSIS — Z808 Family history of malignant neoplasm of other organs or systems: Secondary | ICD-10-CM | POA: Diagnosis not present

## 2017-02-05 DIAGNOSIS — E876 Hypokalemia: Secondary | ICD-10-CM | POA: Insufficient documentation

## 2017-02-05 DIAGNOSIS — E119 Type 2 diabetes mellitus without complications: Secondary | ICD-10-CM | POA: Insufficient documentation

## 2017-02-05 DIAGNOSIS — N186 End stage renal disease: Secondary | ICD-10-CM | POA: Diagnosis not present

## 2017-02-05 DIAGNOSIS — I132 Hypertensive heart and chronic kidney disease with heart failure and with stage 5 chronic kidney disease, or end stage renal disease: Secondary | ICD-10-CM | POA: Insufficient documentation

## 2017-02-05 DIAGNOSIS — I509 Heart failure, unspecified: Secondary | ICD-10-CM | POA: Insufficient documentation

## 2017-02-05 DIAGNOSIS — I482 Chronic atrial fibrillation, unspecified: Secondary | ICD-10-CM

## 2017-02-05 DIAGNOSIS — Z79899 Other long term (current) drug therapy: Secondary | ICD-10-CM | POA: Insufficient documentation

## 2017-02-05 DIAGNOSIS — I4891 Unspecified atrial fibrillation: Secondary | ICD-10-CM | POA: Insufficient documentation

## 2017-02-05 DIAGNOSIS — I10 Essential (primary) hypertension: Secondary | ICD-10-CM

## 2017-02-05 LAB — BASIC METABOLIC PANEL
ANION GAP: 10 (ref 5–15)
BUN: 23 mg/dL — ABNORMAL HIGH (ref 6–20)
CALCIUM: 9.1 mg/dL (ref 8.9–10.3)
CHLORIDE: 100 mmol/L — AB (ref 101–111)
CO2: 28 mmol/L (ref 22–32)
Creatinine, Ser: 1.1 mg/dL (ref 0.61–1.24)
GFR calc non Af Amer: >60 mL/min (ref >60)
Glucose, Bld: 98 mg/dL (ref 65–99)
Potassium: 3.5 mmol/L (ref 3.5–5.1)
SODIUM: 138 mmol/L (ref 135–145)

## 2017-02-05 MED ORDER — METOLAZONE 5 MG PO TABS: 5.0000 mg | ORAL_TABLET | ORAL | 0 refills | Status: DC | PRN

## 2017-02-05 NOTE — Telephone Encounter (Signed)
LM on patient's voicemail about his lab results obtained today (02/05/17). Potassium level is now normal (3.5) and renal function looks great (GFR >60/ creatinine 1.10). Continue medications at this time.

## 2017-02-05 NOTE — Patient Instructions (Signed)
Continue weighing daily and call for an overnight weight gain of > 2 pounds or a weekly weight gain of >5 pounds. 

## 2017-02-05 NOTE — Progress Notes (Signed)
Patient ID: [name], male    DOB: 1961-01-28, 56 y.o.   MRN: [id number]  HPI  [name] is a 56 y/o with a history of asthma, atrial fibrillation, CAD (CABG), chronic venous stastis of lower legs, depression, DM, renal disease (s/p renal transplant), GERD, HTN, hyperlipidemia, obstructive sleep apnea (with oxygen), remote tobacco use and chronic heart failure.   Echo done 10/16/16 showed an EF of 45-50% along with normal pulmonary artery systolic pressure. EF has improved from 35-40% back in July 2016. Right cardiac catheterization done 11/15/16 showed severely elevated filling pressure, severe pulmonary HTN and low normal cardiac output. Pulmonary wedge pressure still elevated at 31 mmHg.   Admitted 11/11/16 with HF exacerbation. Had a 22 pound weight gain prior to admission. Initially needed IV diuretics and then an IV lasix drip with metolazone. Cardiology and nephrology consult obtained. Cardiac catheterization done. Discharged home after 5 days.   He presents today for his follow-up visit with a chief complaint of minimal edema in lower legs. He describes this as chronic in nature having been present for several years with varying levels of severity. Feels like the edema is improving, however. He has associated abdominal distention and light-headedness along with this. He denies any fatigue, chest pain, shortness of breath, palpitations, weight gain or difficulty sleeping.   Past Medical History:  Diagnosis Date  . Allergy    seasonal  . Amnestic MCI (mild cognitive impairment with memory loss)   . Asthma   . Atrial fibrillation ([place])   . Atrial fibrillation ([place]) 2016  . Benign neoplasm of colon   . CAD (coronary artery disease) 2005   had CABG  . CHF (congestive heart failure) ([place])   . Chronic venous stasis dermatitis of both lower extremities   . Cytomegaloviral disease ([place]) 2017  . Depression   . Diabetes mellitus   . Diverticulosis   . Dyspnea   . Erectile dysfunction    . ESRD (end stage renal disease) ([place]) 2005   Dialysis then renal transplant  . GERD (gastroesophageal reflux disease)   . Gout   . Hyperlipidemia    low since renal failure  . Hypertension   . Kidney transplant status, cadaveric 2012  . Obesity   . Pneumonia   . Purpura ([place])   . Sleep apnea    bipap with oxygen 2 l  . Trigger finger   . Ulcer 05/2016   Left shin  . Wears glasses    Past Surgical History:  Procedure Laterality Date  . BARIATRIC SURGERY    . CARDIAC CATHETERIZATION     [facility]  . CORONARY ARTERY BYPASS GRAFT  2005   X 4  . DG ANGIO AV SHUNT*L*     right and left upper arms  . KIDNEY TRANSPLANT  09/13/2010   cadaver--at [place]  1/12   left  . VASECTOMY     Family History  Problem Relation Age of Onset  . Heart disease Father   . Kidney failure Father   . Kidney disease Father   . Diabetes Maternal Grandmother   . Breast cancer Maternal Grandmother   . Valvular heart disease Mother   . Liver cancer Paternal Uncle   . Liver cancer Paternal Grandmother   . Prostate cancer Neg Hx    Social History   Tobacco Use  . Smoking status: Former Smoker    Types: Cigars    Last attempt to quit: 09/02/1994    Years  since quitting: 22.4  . Smokeless tobacco: Never Used  Substance Use Topics  . Alcohol use: Yes    Alcohol/week: 0.0 - 0.6 oz    Comment: occasional- 3 in the past year   Allergies  Allergen Reactions  . Morphine Itching   Prior to Admission medications   Medication Sig Start Date End Date Taking? Authorizing Provider  amLODipine (NORVASC) 5 MG tablet TAKE 1 TABLET BY MOUTH DAILY 06/21/16  Yes [name], MD  beclomethasone (QVAR) 80 MCG/ACT inhaler Inhale 2 puffs into the lungs 2 (two) times daily. 05/10/16  Yes [name], MD  buPROPion (WELLBUTRIN) 75 MG tablet Take 1 tablet (75 mg total) by mouth 2 (two) times daily. 02/21/16  Yes [name], MD  ELIQUIS 5 MG TABS tablet TAKE 1 TABLET BY  MOUTH 2 TIMES DAILY 11/26/16  Yes [name], MD  furosemide (LASIX) 40 MG tablet Take 1 tablet (40 mg total) by mouth 2 (two) times daily. Take an additional 73m in the PM as needed for weight gain. 01/20/17 04/20/17 Yes [name], [name], FNP  insulin glargine (LANTUS) 100 unit/mL SOPN Inject 0.1 mLs (10 Units total) into the skin at bedtime. Currently using 15 units Patient taking differently: Inject 10 Units into the skin at bedtime. Currently using 10 units 11/16/16  Yes [name] MD  lisinopril (PRINIVIL,ZESTRIL) 40 MG tablet TAKE 1 TABLET BY MOUTH DAILY 11/04/16  Yes AWellington Hampshire MD  metolazone (ZAROXOLYN) 5 MG tablet Take 1 tablet (5 mg total) as needed by mouth. 02/05/17 05/06/17 Yes [name], TAura Fey FNP  mometasone (NASONEX) 50 MCG/ACT nasal spray PLACE 2 SPRAYS INTO THE NOSE AT BEDTIME. 11/26/16  Yes LVenia Carbon MD  Multiple Vitamin (MULTIVITAMIN WITH MINERALS) TABS tablet Take 1 tablet by mouth daily.   Yes [provider]  mycophenolate (MYFORTIC) 180 MG EC tablet Take 360 mg by mouth 2 (two) times daily.    Yes [provider]  omeprazole (PRILOSEC) 20 MG capsule TAKE 1 CAPSULE BY MOUTH ONCE DAILY 02/21/16  Yes LViviana SimplerI, MD  potassium chloride (MICRO-K) 10 MEQ CR capsule Take 20 mEq by mouth 2 (two) times daily.   Yes [provider]  rosuvastatin (CRESTOR) 10 MG tablet Take 1 tablet (10 mg total) by mouth daily. Patient taking differently: Take 10 mg by mouth daily. taking 02/02/16 02/05/17 Yes AWellington Hampshire MD  sildenafil (VIAGRA) 100 MG tablet Take 0.5-1 tablets (50-100 mg total) by mouth daily as needed for erectile dysfunction. 01/03/17  Yes LVenia Carbon MD  spironolactone (ALDACTONE) 25 MG tablet Take 1 tablet (25 mg total) by mouth daily. 11/16/16 11/16/17 Yes [name] MD  sulfamethoxazole-trimethoprim (BACTRIM,SEPTRA) 400-80 MG tablet Take 1 tablet by mouth 3 (three) times a week.   Yes [provider]   tacrolimus (PROGRAF) 0.5 MG capsule Take 1 mg by mouth 2 (two) times daily.   Yes [provider]    Review of Systems  Constitutional: Negative for appetite change and fatigue.  HENT: Negative for congestion, postnasal drip and sore throat.   Eyes: Negative.   Respiratory: Negative for cough, chest tightness and shortness of breath.   Cardiovascular: Positive for leg swelling (minimal). Negative for chest pain and palpitations.  Gastrointestinal: Positive for abdominal distention ("better"). Negative for abdominal pain.  Endocrine: Negative.   Genitourinary: Negative.   Musculoskeletal: Negative for back pain and neck pain.  Skin: Negative.   Allergic/Immunologic: Negative.   Neurological: Positive for  light-headedness (when bending over). Negative for dizziness.  Hematological: Negative for adenopathy. Does not bruise/bleed easily.  Psychiatric/Behavioral: Positive for sleep disturbance. Negative for dysphoric mood. The patient is not nervous/anxious.    Vitals:   02/05/17 0851  BP: (!) 151/79  Pulse: (!) 52  Resp: 20  SpO2: 97%  Weight: 267 lb 2 oz (121.2 kg)  Height: _0  (1.803 m)   Wt Readings from Last 3 Encounters:  02/05/17 267 lb 2 oz (121.2 kg)  01/29/17 266 lb 8 oz (120.9 kg)  01/23/17 283 lb 4 oz (128.5 kg)    Lab Results  Component Value Date   CREATININE 1.21 01/29/2017   CREATININE 1.19 01/20/2017   CREATININE 1.45 (H) 11/16/2016   Physical Exam  Constitutional: He is oriented to person, place, and time. He appears well-developed and well-nourished.  HENT:  Head: Normocephalic and atraumatic.  Neck: Normal range of motion. Neck supple. No JVD present.  Cardiovascular: An irregular rhythm present. Bradycardia present.  Pulmonary/Chest: Effort normal. He has no wheezes. He has no rales.  Abdominal: Soft. He exhibits distension (softer). There is no tenderness.  Musculoskeletal: He exhibits edema (tracein  bilateral lower legs). He exhibits no  tenderness.  Neurological: He is alert and oriented to person, place, and time.  Skin: Skin is warm and dry.  Psychiatric: He has a normal mood and affect. His behavior is normal. Thought content normal.  Nursing note and vitals reviewed.   Assessment & Plan:  1: Chronic heart failure with preserved ejection fraction- - NYHA class I - mildly fluid overloaded today - continues to weigh daily and notes a gradual weight loss. Reminded to call for an overnight weight gain of >2 pounds or a weekly weight gain of >5 pounds; he feels like his dry weight is close to 156 pounds - weight stable since he was last here a week ago - received IV furosemide 366m on 01/20/17 - not adding salt and has been reading food labels. Discussed the importance of closely following a 20023msodium diet - took 1 dose of 66m52metolazone a few days ago along with extra 71m59motassium; depending on today's lab results, may be able to take metolazone PRN - has resumed going to the gym - is on spironolactone and potassium - BMP from 01/29/17 reviewed and shows sodium 137, potassium 3.3 and GFR >60  2: HTN- - BP better - saw PCP (LetSilvio Pate/3/18  3: Atrial fibrillation- - currently rate controlled at this time - on amlodipine, apixaban - going to speak with cardiology on 04/08/17 regarding whether he's a good candidate for cardioversion or not  4: Hypokalemia- - BMP drawn today  Patient did not bring his medications nor a list. Each medication was verbally reviewed with the patient and he was encouraged to bring the bottles to every visit to confirm accuracy of list.  Return in 1 week or sooner for any questions/problems before then.

## 2017-02-10 ENCOUNTER — Telehealth: Payer: Self-pay | Admitting: Internal Medicine

## 2017-02-10 ENCOUNTER — Ambulatory Visit (INDEPENDENT_AMBULATORY_CARE_PROVIDER_SITE_OTHER)
Admission: RE | Admit: 2017-02-10 | Discharge: 2017-02-10 | Disposition: A | Discharge status: Home / Self Care | Payer: 59 | Source: Ambulatory Visit | Attending: Internal Medicine | Admitting: Internal Medicine

## 2017-02-10 ENCOUNTER — Ambulatory Visit: Payer: Self-pay

## 2017-02-10 ENCOUNTER — Encounter: Payer: Self-pay | Admitting: Internal Medicine

## 2017-02-10 ENCOUNTER — Ambulatory Visit (INDEPENDENT_AMBULATORY_CARE_PROVIDER_SITE_OTHER): Payer: 59 | Admitting: Internal Medicine

## 2017-02-10 ENCOUNTER — Other Ambulatory Visit: Payer: Self-pay

## 2017-02-10 VITALS — BP 130/74 | HR 80 | Temp 98.0°F | Resp 24 | Wt 268.0 lb

## 2017-02-10 DIAGNOSIS — I2510 Atherosclerotic heart disease of native coronary artery without angina pectoris: Secondary | ICD-10-CM

## 2017-02-10 DIAGNOSIS — R05 Cough: Secondary | ICD-10-CM

## 2017-02-10 DIAGNOSIS — R059 Cough, unspecified: Secondary | ICD-10-CM | POA: Insufficient documentation

## 2017-02-10 MED ORDER — AMOXICILLIN 500 MG PO TABS: 1000.0000 mg | ORAL_TABLET | Freq: Two times a day (BID) | ORAL | 1 refills | Status: DC

## 2017-02-10 MED ORDER — ALBUTEROL SULFATE HFA 108 (90 BASE) MCG/ACT IN AERS: 2.0000 | INHALATION_SPRAY | Freq: Four times a day (QID) | RESPIRATORY_TRACT | 2 refills | Status: DC | PRN

## 2017-02-10 MED ORDER — BENZONATATE 200 MG PO CAPS: 200.0000 mg | ORAL_CAPSULE | Freq: Three times a day (TID) | ORAL | 0 refills | Status: DC | PRN

## 2017-02-10 NOTE — Telephone Encounter (Signed)
I corrected my saved Rx

## 2017-02-10 NOTE — Patient Outreach (Signed)
[facility]) Care Management  02/10/2017  [name] Feb 27, 1961 [id number]   Spoke to the patient by phone this afternoon.  I received a message from the patient saying he was unable to attend the appointment as he had a fever.    We discussed the transition of care to the [place] [facility] for 2019.  I have encouraged him to call benefits or the pharmacy for questions regarding medications and over benefits.    [name], RN, BA, [place], [place] Direct Dial:  [phone]  Fax:  [phone] E-mail: [name] [address] [place], [place]  [address]

## 2017-02-10 NOTE — Telephone Encounter (Signed)
Copied from [place] [id number]. Topic: Quick Communication - See Telephone Encounter >> Feb 10, 2017  2:45 PM Ether Griffins B wrote: CRM for notification. See Telephone encounter for:  Pam at [facility] calling in regards to medication being called in the quality 28. Instructions state take two capsules twice a day for 10 days. Pharmacy not sure if its suppose to be for 10 days or 7 days. Call back [phone]  02/10/17.

## 2017-02-10 NOTE — Assessment & Plan Note (Addendum)
Fairly severe and he is immunosuppressed Asthmatic sound but intolerant of prednisone Seems mostly sinus related CXR--no pneumonia (will await the radiologist to confirm)  Will treat with benzonatate for cough Amoxil for apparent sinus infection Refill albuterol

## 2017-02-10 NOTE — Progress Notes (Signed)
Subjective:    Patient ID: [name], male    DOB: 05-Feb-1961, 56 y.o.   MRN: [id number]  HPI Here due to respiratory infection Started with runny nose 2 days ago--clear mucus Then "everything hit" Now coughing up yellow phlegm  May have fever--bad chills yesterday Unable to sleep yesterday Some SOB now--?from head congestion  Some sore throat---may be from drainage Bad pressure behind his eyes No ear pain but feels congestion Has to sleep sitting up (otherwise worse head drainage)  Using robitussin cough--?some help  Current Outpatient Medications on File Prior to Visit  Medication Sig Dispense Refill  . amLODipine (NORVASC) 5 MG tablet TAKE 1 TABLET BY MOUTH DAILY 90 tablet 3  . beclomethasone (QVAR) 80 MCG/ACT inhaler Inhale 2 puffs into the lungs 2 (two) times daily. 1 Inhaler 6  . buPROPion (WELLBUTRIN) 75 MG tablet Take 1 tablet (75 mg total) by mouth 2 (two) times daily. 180 tablet 3  . ELIQUIS 5 MG TABS tablet TAKE 1 TABLET BY MOUTH 2 TIMES DAILY 60 tablet 3  . furosemide (LASIX) 40 MG tablet Take 1 tablet (40 mg total) by mouth 2 (two) times daily. Take an additional 1m in the PM as needed for weight gain. 100 tablet 3  . insulin glargine (LANTUS) 100 unit/mL SOPN Inject 0.1 mLs (10 Units total) into the skin at bedtime. Currently using 15 units (Patient taking differently: Inject 10 Units into the skin at bedtime. Currently using 10 units) 15 mL 11  . lisinopril (PRINIVIL,ZESTRIL) 40 MG tablet TAKE 1 TABLET BY MOUTH DAILY 30 tablet 3  . metolazone (ZAROXOLYN) 5 MG tablet Take 1 tablet (5 mg total) as needed by mouth. 15 tablet 0  . mometasone (NASONEX) 50 MCG/ACT nasal spray PLACE 2 SPRAYS INTO THE NOSE AT BEDTIME. 17 g 11  . Multiple Vitamin (MULTIVITAMIN WITH MINERALS) TABS tablet Take 1 tablet by mouth daily.    . mycophenolate (MYFORTIC) 180 MG EC tablet Take 360 mg by mouth 2 (two) times daily.     .Marland Kitchenomeprazole (PRILOSEC) 20 MG capsule TAKE 1 CAPSULE BY MOUTH  ONCE DAILY 90 capsule 3  . potassium chloride (MICRO-K) 10 MEQ CR capsule Take 20 mEq by mouth 2 (two) times daily.    . sildenafil (VIAGRA) 100 MG tablet Take 0.5-1 tablets (50-100 mg total) by mouth daily as needed for erectile dysfunction. 10 tablet 11  . spironolactone (ALDACTONE) 25 MG tablet Take 1 tablet (25 mg total) by mouth daily. 30 tablet 11  . sulfamethoxazole-trimethoprim (BACTRIM,SEPTRA) 400-80 MG tablet Take 1 tablet by mouth 3 (three) times a week.    . tacrolimus (PROGRAF) 0.5 MG capsule Take 1 mg by mouth 2 (two) times daily.    . rosuvastatin (CRESTOR) 10 MG tablet Take 1 tablet (10 mg total) by mouth daily. (Patient taking differently: Take 10 mg by mouth daily. taking) 90 tablet 3   No current facility-administered medications on file prior to visit.     Allergies  Allergen Reactions  . Morphine Itching    Past Medical History:  Diagnosis Date  . Allergy    seasonal  . Amnestic MCI (mild cognitive impairment with memory loss)   . Asthma   . Atrial fibrillation ([place]   . Atrial fibrillation ([place] 2016  . Benign neoplasm of colon   . CAD (coronary artery disease) 2005   had CABG  . CHF (congestive heart failure) ([place]   . Chronic venous stasis dermatitis of both lower extremities   .  Cytomegaloviral disease ([place]) 2017  . Depression   . Diabetes mellitus   . Diverticulosis   . Dyspnea   . Erectile dysfunction   . ESRD (end stage renal disease) ([place]) 2005   Dialysis then renal transplant  . GERD (gastroesophageal reflux disease)   . Gout   . Hyperlipidemia    low since renal failure  . Hypertension   . Kidney transplant status, cadaveric 2012  . Obesity   . Pneumonia   . Purpura ([place])   . Sleep apnea    bipap with oxygen 2 l  . Trigger finger   . Ulcer 05/2016   Left shin  . Wears glasses     Past Surgical History:  Procedure Laterality Date  . BARIATRIC SURGERY    . CARDIAC CATHETERIZATION     [facility]  . COLONOSCOPY WITH PROPOFOL N/A  04/22/2013   Performed by [name], MD at [facility]  . CORONARY ARTERY BYPASS GRAFT  2005   X 4  . DG ANGIO AV SHUNT*L*     right and left upper arms  . FASCIOTOMY Right 03/03/2012   Performed by [name], MD at [facility]  . FASCIOTOMY LEFT RING Left 08/17/2013   Performed by Daryll Brod, MD at [facility]  . INCISION AND DRAINAGE ABSCESS Left 10/15/2015   Performed by [name], MD at [facility]  . KIDNEY TRANSPLANT  09/13/2010   cadaver--at [facility]  . RIGHT HEART CATH N/A 11/15/2016   Performed by [name], MD at [facility]  . TYMPANIC MEMBRANE REPAIR  1/12   left  . VASECTOMY      Family History  Problem Relation Age of Onset  . Heart disease Father   . Kidney failure Father   . Kidney disease Father   . Diabetes Maternal Grandmother   . Breast cancer Maternal Grandmother   . Valvular heart disease Mother   . Liver cancer Paternal Uncle   . Liver cancer Paternal Grandmother   . Prostate cancer Neg Hx     Social History   Socioeconomic History  . Marital status: Married    Spouse name: Not on file  . Number of children: 2  . Years of education: Not on file  . Highest education level: Not on file  Social Needs  . Financial resource strain: Not on file  . Food insecurity - worry: Not on file  . Food insecurity - inability: Not on file  . Transportation needs - medical: Not on file  . Transportation needs - non-medical: Not on file  Occupational History  . Occupation: Academic librarian: unemployed    Comment: disabled due to kidney failure  Tobacco Use  . Smoking status: Former Smoker    Types: Cigars    Last attempt to quit: 09/02/1994    Years since quitting: 22.4  . Smokeless tobacco: Never Used  Substance and Sexual Activity  . Alcohol use: Yes    Alcohol/week: 0.0 - 0.6 oz    Comment: occasional- 3 in the past year  . Drug use: No  . Sexual activity: Not on file  Other  Topics Concern  . Not on file  Social History Narrative   In school for culinary arts--- will finish 12/16      Has living will   Wife is health care POA   Would accept resuscitation attempts   Hasn't considered tube feedings   Review  of Systems No vomiting but some nausea (5-6 days) Appetite is off No rash    Objective:   Physical Exam  Constitutional: No distress.  Tight frequent cough  HENT:  bilat maxillary tenderness Moderate nasal congestion TMs normal Pharynx without injection  Neck: No thyromegaly present.  Pulmonary/Chest: Effort normal.  Fair air movement Slight rhonchi at bases Slightly increased expiratory phase  Musculoskeletal: He exhibits no edema.  Lymphadenopathy:    He has no cervical adenopathy.          Assessment & Plan:

## 2017-02-10 NOTE — Telephone Encounter (Signed)
Spoke to [employer] at [facility]. It is a 7 day supply.

## 2017-02-10 NOTE — Telephone Encounter (Signed)
Patients wife calling and states she is waiting at the pharmacy for this medication to be filled.

## 2017-02-17 ENCOUNTER — Telehealth: Payer: Self-pay | Admitting: *Deleted

## 2017-02-17 MED ORDER — AMOXICILLIN-POT CLAVULANATE 875-125 MG PO TABS: 1.0000 | ORAL_TABLET | Freq: Two times a day (BID) | ORAL | 0 refills | Status: AC

## 2017-02-17 NOTE — Telephone Encounter (Signed)
Spoke to pt. He said he was not SOB or having fever. Wants to try Augmentin. I have sent in the rx. He did not think he needed the OV tomorrow.

## 2017-02-17 NOTE — Telephone Encounter (Signed)
I would probably just change him to augmentin 875 bid (#14 x 0) unless he is SOB or sig fever. If needed, can add him on tomorrow as well (12:15)

## 2017-02-17 NOTE — Telephone Encounter (Signed)
Copied from [place]. Topic: Inquiry >> Feb 17, 2017 10:44 AM Conception Chancy, NT wrote: Reason for CRM: pt is calling back states he seen Dr. Silvio Pate last week, patient still has a really bad cough, and is now getting nose bleeds often, patient would like to be fit in asap. Please call pt

## 2017-02-18 ENCOUNTER — Telehealth: Payer: Self-pay

## 2017-02-18 ENCOUNTER — Telehealth: Payer: Self-pay | Admitting: Internal Medicine

## 2017-02-18 NOTE — Telephone Encounter (Signed)
Attempted to cal pt again regarding nosebleeds. No answer, left voicemail to return call to discuss symptoms further

## 2017-02-18 NOTE — Telephone Encounter (Signed)
Copied from CRM #[id number]. Topic: Quick Communication - See Telephone Encounter >> Feb 18, 2017 10:25 AM [name], [name] wrote: CRM for notification. See Telephone encounter for: 02/18/17. Pt is wanting to talk with a nurse regarding  consistant nose bleeds   Best number [phone]   

## 2017-02-18 NOTE — Telephone Encounter (Signed)
Nurse triage has attempted to call patient back twice today.  See telephone encounters

## 2017-02-18 NOTE — Telephone Encounter (Signed)
Copied from [place]. Topic: Quick Communication - See Telephone Encounter >> Feb 18, 2017 10:25 AM Hewitt Shorts wrote: CRM for notification. See Telephone encounter for: 02/18/17. Pt is wanting to talk with a nurse regarding  consistant nose bleeds   Best number [phone]

## 2017-02-19 NOTE — Telephone Encounter (Signed)
Copied from [place] [phone]. Topic: Inquiry >> Feb 18, 2017  5:59 PM Corie Chiquito, [place] wrote: Reason for CRM: Patient had a missed call from the office and would like a call back at [phone]  >> Feb 19, 2017 10:38 AM Pilar Grammes, CMA wrote: [facility] Nurse has been trying to reach pt. No one at [facility] has called him. / Attempted to call patient again in regards to his C/O nosebleeds.  Left message that if he is still having nose bleeds and is concerned, he could call back to speak with a nurse OR schedule an office visit.  If this situation has cleared up there is no need for him to call us back at this time.

## 2017-02-24 DIAGNOSIS — I482 Chronic atrial fibrillation: Secondary | ICD-10-CM | POA: Diagnosis not present

## 2017-02-24 DIAGNOSIS — G4733 Obstructive sleep apnea (adult) (pediatric): Secondary | ICD-10-CM | POA: Diagnosis not present

## 2017-02-24 DIAGNOSIS — E662 Morbid (severe) obesity with alveolar hypoventilation: Secondary | ICD-10-CM | POA: Diagnosis not present

## 2017-03-06 ENCOUNTER — Other Ambulatory Visit: Payer: Self-pay | Admitting: Cardiovascular Disease

## 2017-03-06 NOTE — Telephone Encounter (Signed)
Please review for refill, thanks !

## 2017-03-06 NOTE — Progress Notes (Signed)
Patient ID: [name], male    DOB: 24-Feb-1961, 56 y.o.   MRN: [id number]  HPI  [name] is a 56 y/o with a history of asthma, atrial fibrillation, CAD (CABG), chronic venous stastis of lower legs, depression, DM, renal disease (s/p renal transplant), GERD, HTN, hyperlipidemia, obstructive sleep apnea (with oxygen), remote tobacco use and chronic heart failure.   Echo done 10/16/16 showed an EF of 45-50% along with normal pulmonary artery systolic pressure. EF has improved from 35-40% back in July 2016. Right cardiac catheterization done 11/15/16 showed severely elevated filling pressure, severe pulmonary HTN and low normal cardiac output. Pulmonary wedge pressure still elevated at 31 mmHg.   Admitted 11/11/16 with HF exacerbation. Had a 22 pound weight gain prior to admission. Initially needed IV diuretics and then an IV lasix drip with metolazone. Cardiology and nephrology consult obtained. Cardiac catheterization done. Discharged home after 5 days.   He presents today for his follow-up visit with a chief complaint of mild fatigue upon moderate exertion. He describes this as chronic in nature having been present for several years with varying levels of severity. He has associated edema, abdominal distention, difficulty sleeping and weight gain along with this. He denies any chest pain, shortness of breath or dizziness.   Past Medical History:  Diagnosis Date  . Allergy    seasonal  . Amnestic MCI (mild cognitive impairment with memory loss)   . Asthma   . Atrial fibrillation ([place])   . Atrial fibrillation ([place]) 2016  . Benign neoplasm of colon   . CAD (coronary artery disease) 2005   had CABG  . CHF (congestive heart failure) ([place])   . Chronic venous stasis dermatitis of both lower extremities   . Cytomegaloviral disease ([place]) 2017  . Depression   . Diabetes mellitus   . Diverticulosis   . Dyspnea   . Erectile dysfunction   . ESRD (end stage renal disease) ([place]) 2005   Dialysis  then renal transplant  . GERD (gastroesophageal reflux disease)   . Gout   . Hyperlipidemia    low since renal failure  . Hypertension   . Kidney transplant status, cadaveric 2012  . Obesity   . Pneumonia   . Purpura ([place])   . Sleep apnea    bipap with oxygen 2 l  . Trigger finger   . Ulcer 05/2016   Left shin  . Wears glasses    Past Surgical History:  Procedure Laterality Date  . BARIATRIC SURGERY    . CARDIAC CATHETERIZATION     [facility]  . COLONOSCOPY WITH PROPOFOL N/A 04/22/2013   Procedure: COLONOSCOPY WITH PROPOFOL;  Surgeon: [name], MD;  [facility];  Service: Endoscopy;  Laterality: N/A;  . CORONARY ARTERY BYPASS GRAFT  2005   X 4  . DG ANGIO AV SHUNT*L*     right and left upper arms  . FASCIOTOMY  03/03/2012   Procedure: FASCIOTOMY;  Surgeon: [name], MD;  Location: [place];  Service: Orthopedics;  Laterality: Right;  FASCIOTOMY RIGHT SMALL FINGER  . FASCIOTOMY Left 08/17/2013   Procedure: FASCIOTOMY LEFT RING;  Surgeon: [name], MD;  Location: [place];  Service: Orthopedics;  Laterality: Left;  . INCISION AND DRAINAGE ABSCESS Left 10/15/2015   Procedure: INCISION AND DRAINAGE ABSCESS;  Surgeon: [name], MD;  Location: [facility];  Service: General;  Laterality: Left;  . KIDNEY TRANSPLANT  09/13/2010   cadaver--at [facility]  .  RIGHT HEART CATH N/A 11/15/2016   Procedure: RIGHT HEART CATH;  Surgeon: [name], MD;  Location: [facility];  Service: Cardiovascular;  Laterality: N/A;  . TYMPANIC MEMBRANE REPAIR  1/12   left  . VASECTOMY     Family History  Problem Relation Age of Onset  . Heart disease Father   . Kidney failure Father   . Kidney disease Father   . Diabetes Maternal Grandmother   . Breast cancer Maternal Grandmother   . Valvular heart disease Mother   . Liver cancer Paternal Uncle   . Liver cancer Paternal Grandmother   . Prostate cancer Neg Hx    Social History    Tobacco Use  . Smoking status: Former Smoker    Types: Cigars    Last attempt to quit: 09/02/1994    Years since quitting: 22.5  . Smokeless tobacco: Never Used  Substance Use Topics  . Alcohol use: Yes    Alcohol/week: 0.0 - 0.6 oz    Comment: occasional- 3 in the past year   Allergies  Allergen Reactions  . Morphine Itching   Prior to Admission medications   Medication Sig Start Date End Date Taking? Authorizing Provider  albuterol (PROVENTIL HFA;VENTOLIN HFA) 108 (90 Base) MCG/ACT inhaler Inhale 2 puffs every 6 (six) hours as needed into the lungs for wheezing or shortness of breath. 02/10/17  Yes [name] I, MD  amLODipine (NORVASC) 5 MG tablet TAKE 1 TABLET BY MOUTH DAILY 06/21/16  Yes [name], MD  beclomethasone (QVAR) 80 MCG/ACT inhaler Inhale 2 puffs into the lungs 2 (two) times daily. 05/10/16  Yes [name], MD  benzonatate (TESSALON) 200 MG capsule Take 1 capsule (200 mg total) 3 (three) times daily as needed by mouth for cough. 02/10/17  Yes [name], MD  buPROPion (WELLBUTRIN) 75 MG tablet Take 1 tablet (75 mg total) by mouth 2 (two) times daily. 02/21/16  Yes [name], MD  ELIQUIS 5 MG TABS tablet TAKE 1 TABLET BY MOUTH 2 TIMES DAILY 03/06/17  Yes [name], MD  furosemide (LASIX) 40 MG tablet Take 1 tablet (40 mg total) by mouth 2 (two) times daily. Take an additional 75m in the PM as needed for weight gain. 01/20/17 04/20/17 Yes Edson Deridder, [name], FNP  insulin glargine (LANTUS) 100 unit/mL SOPN Inject 0.1 mLs (10 Units total) into the skin at bedtime. Currently using 15 units Patient taking differently: Inject 10 Units into the skin at bedtime. Currently using 10 units 11/16/16  Yes [name] MD  lisinopril (PRINIVIL,ZESTRIL) 40 MG tablet TAKE 1 TABLET BY MOUTH DAILY 11/04/16  Yes AWellington Hampshire MD  metolazone (ZAROXOLYN) 5 MG tablet Take 1 tablet (5 mg total) as needed by mouth. 02/05/17 05/06/17 Yes Delrose Rohwer, TAura Fey  FNP  mometasone (NASONEX) 50 MCG/ACT nasal spray PLACE 2 SPRAYS INTO THE NOSE AT BEDTIME. 11/26/16  Yes LVenia Carbon MD  Multiple Vitamin (MULTIVITAMIN WITH MINERALS) TABS tablet Take 1 tablet by mouth daily.   Yes [provider]  mycophenolate (MYFORTIC) 180 MG EC tablet Take 360 mg by mouth 2 (two) times daily.    Yes [provider]  omeprazole (PRILOSEC) 20 MG capsule TAKE 1 CAPSULE BY MOUTH ONCE DAILY 02/21/16  Yes LViviana SimplerI, MD  potassium chloride (MICRO-K) 10 MEQ CR capsule Take 20 mEq by mouth 2 (two) times daily.   Yes [provider]  rosuvastatin (CRESTOR) 10 MG tablet Take 10 mg  by mouth daily.   Yes [provider]  sildenafil (VIAGRA) 100 MG tablet Take 0.5-1 tablets (50-100 mg total) by mouth daily as needed for erectile dysfunction. 01/03/17  Yes [name], MD  spironolactone (ALDACTONE) 25 MG tablet Take 1 tablet (25 mg total) by mouth daily. 11/16/16 11/16/17 Yes Fritzi Mandes, MD  sulfamethoxazole-trimethoprim (BACTRIM,SEPTRA) 400-80 MG tablet Take 1 tablet by mouth 3 (three) times a week.   Yes [provider]  tacrolimus (PROGRAF) 0.5 MG capsule Take 1 mg by mouth 2 (two) times daily.   Yes [provider]    Review of Systems  Constitutional: Positive for fatigue. Negative for appetite change.  HENT: Negative for congestion, postnasal drip and sore throat.   Eyes: Negative.   Respiratory: Negative for cough, chest tightness and shortness of breath.   Cardiovascular: Positive for leg swelling (minimal). Negative for chest pain and palpitations.  Gastrointestinal: Positive for abdominal distention ("better"). Negative for abdominal pain.  Endocrine: Negative.   Genitourinary: Negative.   Musculoskeletal: Negative for back pain and neck pain.  Skin: Negative.   Allergic/Immunologic: Negative.   Neurological: Negative for dizziness and light-headedness.  Hematological: Negative for adenopathy. Does not  bruise/bleed easily.  Psychiatric/Behavioral: Positive for sleep disturbance. Negative for dysphoric mood. The patient is not nervous/anxious.    Vitals:   03/07/17 0826  BP: (!) 142/76  Pulse: 61  Resp: 18  SpO2: 99%  Weight: 270 lb 8 oz (122.7 kg)  Height: _0  (1.803 m)   Wt Readings from Last 3 Encounters:  03/07/17 270 lb 8 oz (122.7 kg)  02/10/17 268 lb (121.6 kg)  02/05/17 267 lb 2 oz (121.2 kg)    Lab Results  Component Value Date   CREATININE 1.10 02/05/2017   CREATININE 1.21 01/29/2017   CREATININE 1.19 01/20/2017   Physical Exam  Constitutional: He is oriented to person, place, and time. He appears well-developed and well-nourished.  HENT:  Head: Normocephalic and atraumatic.  Neck: Normal range of motion. Neck supple. No JVD present.  Cardiovascular: An irregular rhythm present. Bradycardia present.  Pulmonary/Chest: Effort normal. He has no wheezes. He has no rales.  Abdominal: Soft. He exhibits distension (softer). There is no tenderness.  Musculoskeletal: He exhibits edema (trace in  bilateral lower legs). He exhibits no tenderness.  Neurological: He is alert and oriented to person, place, and time.  Skin: Skin is warm and dry.  Psychiatric: He has a normal mood and affect. His behavior is normal. Thought content normal.  Nursing note and vitals reviewed.   Assessment & Plan:  1: Chronic heart failure with preserved ejection fraction- - NYHA class II - mildly fluid overloaded today - continues to weigh daily and notes a gradual weight gain. Reminded to call for an overnight weight gain of >2 pounds or a weekly weight gain of >5 pounds; he feels like his dry weight is close to 156 pounds - weight up 3 pounds since he was last here a week ago - received IV furosemide 12m on 01/20/17 - not adding salt and has been reading food labels. Discussed the importance of closely following a 20025msodium diet - has been taking metolazone ~1 time/week - hasn't  been going to the gym as much due to the weather - is on spironolactone and potassium; has a history of hypokalemia - BMP from 02/05/17 reviewed and shows sodium 138, potassium 3.5 and GFR >60 - plan on rechecking BMP at his next visit if not done before then -  sees cardiologist Fletcher Anon) 04/08/17 - has tried torsemide in the past without much relief  2: HTN- - BP looks good today - saw PCP Silvio Pate) 02/10/17  3: Atrial fibrillation- - currently rate controlled at this time - on amlodipine, apixaban - going to speak with cardiology on 04/08/17 regarding whether he's a good candidate for cardioversion or not   Patient did not bring his medications nor a list. Each medication was verbally reviewed with the patient and he was encouraged to bring the bottles to every visit to confirm accuracy of list.  Return in 2 months or sooner for any questions/problems before then. Letter written today for his employer stating that he may need frequent appointments due to health status.

## 2017-03-07 ENCOUNTER — Ambulatory Visit: Payer: 59 | Attending: Family | Admitting: Family

## 2017-03-07 ENCOUNTER — Encounter: Payer: Self-pay | Admitting: Family

## 2017-03-07 VITALS — BP 142/76 | HR 61 | Resp 18 | Ht 71.0 in | Wt 270.5 lb

## 2017-03-07 DIAGNOSIS — Z794 Long term (current) use of insulin: Secondary | ICD-10-CM | POA: Diagnosis not present

## 2017-03-07 DIAGNOSIS — Z7901 Long term (current) use of anticoagulants: Secondary | ICD-10-CM | POA: Insufficient documentation

## 2017-03-07 DIAGNOSIS — F329 Major depressive disorder, single episode, unspecified: Secondary | ICD-10-CM | POA: Diagnosis not present

## 2017-03-07 DIAGNOSIS — E669 Obesity, unspecified: Secondary | ICD-10-CM | POA: Insufficient documentation

## 2017-03-07 DIAGNOSIS — Z885 Allergy status to narcotic agent status: Secondary | ICD-10-CM | POA: Insufficient documentation

## 2017-03-07 DIAGNOSIS — Z8249 Family history of ischemic heart disease and other diseases of the circulatory system: Secondary | ICD-10-CM | POA: Insufficient documentation

## 2017-03-07 DIAGNOSIS — I482 Chronic atrial fibrillation, unspecified: Secondary | ICD-10-CM

## 2017-03-07 DIAGNOSIS — G3184 Mild cognitive impairment, so stated: Secondary | ICD-10-CM | POA: Diagnosis not present

## 2017-03-07 DIAGNOSIS — Z841 Family history of disorders of kidney and ureter: Secondary | ICD-10-CM | POA: Diagnosis not present

## 2017-03-07 DIAGNOSIS — K219 Gastro-esophageal reflux disease without esophagitis: Secondary | ICD-10-CM | POA: Diagnosis not present

## 2017-03-07 DIAGNOSIS — N529 Male erectile dysfunction, unspecified: Secondary | ICD-10-CM | POA: Insufficient documentation

## 2017-03-07 DIAGNOSIS — E785 Hyperlipidemia, unspecified: Secondary | ICD-10-CM | POA: Insufficient documentation

## 2017-03-07 DIAGNOSIS — I110 Hypertensive heart disease with heart failure: Secondary | ICD-10-CM | POA: Diagnosis not present

## 2017-03-07 DIAGNOSIS — I4891 Unspecified atrial fibrillation: Secondary | ICD-10-CM | POA: Diagnosis not present

## 2017-03-07 DIAGNOSIS — I10 Essential (primary) hypertension: Secondary | ICD-10-CM

## 2017-03-07 DIAGNOSIS — I5032 Chronic diastolic (congestive) heart failure: Secondary | ICD-10-CM | POA: Diagnosis not present

## 2017-03-07 DIAGNOSIS — G4733 Obstructive sleep apnea (adult) (pediatric): Secondary | ICD-10-CM | POA: Insufficient documentation

## 2017-03-07 DIAGNOSIS — J45909 Unspecified asthma, uncomplicated: Secondary | ICD-10-CM | POA: Insufficient documentation

## 2017-03-07 DIAGNOSIS — I509 Heart failure, unspecified: Secondary | ICD-10-CM | POA: Diagnosis present

## 2017-03-07 DIAGNOSIS — Z951 Presence of aortocoronary bypass graft: Secondary | ICD-10-CM | POA: Insufficient documentation

## 2017-03-07 DIAGNOSIS — I2510 Atherosclerotic heart disease of native coronary artery without angina pectoris: Secondary | ICD-10-CM | POA: Insufficient documentation

## 2017-03-07 DIAGNOSIS — E119 Type 2 diabetes mellitus without complications: Secondary | ICD-10-CM | POA: Insufficient documentation

## 2017-03-07 DIAGNOSIS — Z940 Kidney transplant status: Secondary | ICD-10-CM | POA: Diagnosis not present

## 2017-03-07 DIAGNOSIS — Z87891 Personal history of nicotine dependence: Secondary | ICD-10-CM | POA: Insufficient documentation

## 2017-03-07 DIAGNOSIS — Z6837 Body mass index (BMI) 37.0-37.9, adult: Secondary | ICD-10-CM | POA: Insufficient documentation

## 2017-03-07 DIAGNOSIS — Z8701 Personal history of pneumonia (recurrent): Secondary | ICD-10-CM | POA: Insufficient documentation

## 2017-03-07 DIAGNOSIS — Z79899 Other long term (current) drug therapy: Secondary | ICD-10-CM | POA: Diagnosis not present

## 2017-03-07 DIAGNOSIS — Z9884 Bariatric surgery status: Secondary | ICD-10-CM | POA: Diagnosis not present

## 2017-03-07 DIAGNOSIS — Z7951 Long term (current) use of inhaled steroids: Secondary | ICD-10-CM | POA: Diagnosis not present

## 2017-03-07 DIAGNOSIS — I872 Venous insufficiency (chronic) (peripheral): Secondary | ICD-10-CM | POA: Insufficient documentation

## 2017-03-07 DIAGNOSIS — Z86010 Personal history of colonic polyps: Secondary | ICD-10-CM | POA: Insufficient documentation

## 2017-03-07 NOTE — Patient Instructions (Signed)
Continue weighing daily and call for an overnight weight gain of > 2 pounds or a weekly weight gain of >5 pounds. 

## 2017-03-11 DIAGNOSIS — R040 Epistaxis: Secondary | ICD-10-CM | POA: Diagnosis not present

## 2017-03-11 DIAGNOSIS — T45515A Adverse effect of anticoagulants, initial encounter: Secondary | ICD-10-CM | POA: Diagnosis not present

## 2017-03-13 ENCOUNTER — Other Ambulatory Visit: Payer: Self-pay | Admitting: Cardiovascular Disease

## 2017-03-13 ENCOUNTER — Other Ambulatory Visit: Payer: Self-pay | Admitting: Family

## 2017-03-19 ENCOUNTER — Other Ambulatory Visit: Payer: Self-pay

## 2017-03-19 ENCOUNTER — Telehealth: Payer: Self-pay | Admitting: Internal Medicine

## 2017-03-19 ENCOUNTER — Ambulatory Visit (INDEPENDENT_AMBULATORY_CARE_PROVIDER_SITE_OTHER): Payer: 59 | Admitting: Family Medicine

## 2017-03-19 ENCOUNTER — Encounter: Payer: Self-pay | Admitting: Family Medicine

## 2017-03-19 VITALS — BP 118/60 | HR 69 | Temp 98.3°F | Ht 71.0 in | Wt 281.0 lb

## 2017-03-19 DIAGNOSIS — Z940 Kidney transplant status: Secondary | ICD-10-CM

## 2017-03-19 DIAGNOSIS — D899 Disorder involving the immune mechanism, unspecified: Secondary | ICD-10-CM | POA: Diagnosis not present

## 2017-03-19 DIAGNOSIS — I2510 Atherosclerotic heart disease of native coronary artery without angina pectoris: Secondary | ICD-10-CM | POA: Diagnosis not present

## 2017-03-19 DIAGNOSIS — D849 Immunodeficiency, unspecified: Secondary | ICD-10-CM

## 2017-03-19 DIAGNOSIS — J0100 Acute maxillary sinusitis, unspecified: Secondary | ICD-10-CM | POA: Diagnosis not present

## 2017-03-19 MED ORDER — HYDROCODONE-HOMATROPINE 5-1.5 MG/5ML PO SYRP: ORAL_SOLUTION | ORAL | 0 refills | Status: DC

## 2017-03-19 MED ORDER — AMOXICILLIN-POT CLAVULANATE 875-125 MG PO TABS: 1.0000 | ORAL_TABLET | Freq: Two times a day (BID) | ORAL | 0 refills | Status: AC

## 2017-03-19 MED ORDER — AMOXICILLIN 500 MG PO CAPS: 1000.0000 mg | ORAL_CAPSULE | Freq: Two times a day (BID) | ORAL | 0 refills | Status: DC

## 2017-03-19 NOTE — Telephone Encounter (Signed)
Pt. Returned call - states he has an appointment for today for his cough and congestion. Requesting "something be called in." Instructed to keep today's appointment for evaluation. Verbalizes understanding.

## 2017-03-19 NOTE — Telephone Encounter (Signed)
Copied from [place]. Topic: Quick Communication - See Telephone Encounter >> Mar 19, 2017  8:11 AM Oneta Rack wrote: CRM for notification. See Telephone encounter for:   03/19/17.   Relation to PO:[facility] Call back number: [phone] Pharmacy: [place], [facility] [phone] (Phone) [phone] (Fax)    Reason for call:  Patient experiencing coughing,congestion, low grade fever for 5x, patient states in the past antibiotics and cough syrup was prescribed without appointment, please advise if Rx can be prescribed.

## 2017-03-19 NOTE — Telephone Encounter (Signed)
Patient has an appointment today with Dr. Lorelei Pont

## 2017-03-19 NOTE — Progress Notes (Signed)
Dr. Frederico Hamman T. Rolan Wrightsman, MD, [facility] [place], [address] Phone: [phone] Fax: [phone]  03/19/2017  Patient: [name], MRN: [id number], DOB: 06-20-1960, 56 y.o.  Primary Physician:  [name], MD   Chief Complaint  Patient presents with  . Cough    with yellow phelgm  . Chills  . Dizziness  . Nasal Congestion  . Fatigue   Subjective:   [name] is a 56 y.o. very pleasant male patient who presents with the following:  Congestion, coughing - no sleep. Feels like no energy. Had about one month ago. Son recently sick. Started on sat.   Pleasant 56 year old gentleman status post renal transplant 6 years ago who was on some immunosuppressants who presents with coughing productive of significant amounts of yellow sputum, chills, dizziness, congestion, fatigue, and exposure to his son who is also been sick.  He started feeling sick about 5 or 6 days ago, he is continued to feel poorly throughout this time.  There is no improvement in his symptoms.  He is now having some facial pain.  Past Medical History, Surgical History, Social History, Family History, Problem List, Medications, and Allergies have been reviewed and updated if relevant.  Patient Active Problem List   Diagnosis Date Noted  . Cough 02/10/2017  . Chronic diastolic heart failure ([place]) 11/19/2016  . Lump 10/14/2016  . Chronic venous insufficiency 06/05/2016  . Chronic ulcer of left leg ([place]) 10/26/2015  . Cytomegalovirus (CMV) viremia ([place]) 09/07/2015  . Proteinuria 06/23/2015  . Hypokalemia 10/13/2014  . BPH with obstruction/lower urinary tract symptoms 09/25/2014  . Hypogonadism in male 09/25/2014  . Immunosuppressed status ([place]) 09/09/2014  . Chronic atrial fibrillation ([name]) 07/18/2014  . Bariatric surgery status 04/25/2014  . DM eye manif type II 10/27/2013  . MDD (recurrent major depressive disorder) in remission  ([place]) 10/04/2013  . Benign neoplasm of colon 04/22/2013  . Diverticulosis of colon (without mention of hemorrhage) 04/22/2013  . History of renal transplant 03/01/2013  . Asthma, persistent controlled 02/25/2013  . Erectile dysfunction 10/20/2012  . Obstructive sleep apnea 09/29/2012  . Gout 09/13/2012  . Obesity hypoventilation syndrome ([place]) 03/31/2012  . Routine general medical examination at a health care facility 11/12/2011  . Type 2 diabetes, controlled, with renal manifestation ([place]) 03/01/2011  . Essential (primary) hypertension 12/17/2010  . H/O kidney transplant 12/17/2010  . CAD, multiple vessel 12/17/2010  . Hyperlipidemia   . ESRD (end stage renal disease) ([place])     Past Medical History:  Diagnosis Date  . Allergy    seasonal  . Amnestic MCI (mild cognitive impairment with memory loss)   . Asthma   . Atrial fibrillation ([place])   . Atrial fibrillation ([place]) 2016  . Benign neoplasm of colon   . CAD (coronary artery disease) 2005   had CABG  . CHF (congestive heart failure) ([place])   . Chronic venous stasis dermatitis of both lower extremities   . Cytomegaloviral disease ([place]) 2017  . Depression   . Diabetes mellitus   . Diverticulosis   . Dyspnea   . Erectile dysfunction   . ESRD (end stage renal disease) ([place]) 2005   Dialysis then renal transplant  . GERD (gastroesophageal reflux disease)   . Gout   . Hyperlipidemia    low since renal failure  . Hypertension   . Kidney transplant status, cadaveric 2012  . Obesity   . Pneumonia   .  Purpura ([place])   . Sleep apnea    bipap with oxygen 2 l  . Trigger finger   . Ulcer 05/2016   Left shin  . Wears glasses     Past Surgical History:  Procedure Laterality Date  . BARIATRIC SURGERY    . CARDIAC CATHETERIZATION     [facility]  . COLONOSCOPY WITH PROPOFOL N/A 04/22/2013   Procedure: COLONOSCOPY WITH PROPOFOL;  Surgeon: [name], MD;  [facility];  Service: Endoscopy;  Laterality: N/A;  . CORONARY  ARTERY BYPASS GRAFT  2005   X 4  . DG ANGIO AV SHUNT*L*     right and left upper arms  . FASCIOTOMY  03/03/2012   Procedure: FASCIOTOMY;  Surgeon: [name], MD;  Location: [name];  Service: Orthopedics;  Laterality: Right;  FASCIOTOMY RIGHT SMALL FINGER  . FASCIOTOMY Left 08/17/2013   Procedure: FASCIOTOMY LEFT RING;  Surgeon: [name], MD;  Location: [facility];  Service: Orthopedics;  Laterality: Left;  . INCISION AND DRAINAGE ABSCESS Left 10/15/2015   Procedure: INCISION AND DRAINAGE ABSCESS;  Surgeon: [name], MD;  Location: [facility];  Service: General;  Laterality: Left;  . KIDNEY TRANSPLANT  09/13/2010   cadaver--at [facility]  . RIGHT HEART CATH N/A 11/15/2016   Procedure: RIGHT HEART CATH;  Surgeon: [name], MD;  Location: [facility];  Service: Cardiovascular;  Laterality: N/A;  . TYMPANIC MEMBRANE REPAIR  1/12   left  . VASECTOMY      Social History   Socioeconomic History  . Marital status: Married    Spouse name: Not on file  . Number of children: 2  . Years of education: Not on file  . Highest education level: Not on file  Social Needs  . Financial resource strain: Not on file  . Food insecurity - worry: Not on file  . Food insecurity - inability: Not on file  . Transportation needs - medical: Not on file  . Transportation needs - non-medical: Not on file  Occupational History  . Occupation: Academic librarian: unemployed    Comment: disabled due to kidney failure  Tobacco Use  . Smoking status: Former Smoker    Types: Cigars    Last attempt to quit: 09/02/1994    Years since quitting: 22.5  . Smokeless tobacco: Never Used  Substance and Sexual Activity  . Alcohol use: Yes    Alcohol/week: 0.0 - 0.6 oz    Comment: occasional- 3 in the past year  . Drug use: No  . Sexual activity: Not on file  Other Topics Concern  . Not on file  Social History Narrative   In school for culinary  arts--- will finish 12/16      Has living will   Wife is health care POA   Would accept resuscitation attempts   Hasn't considered tube feedings    Family History  Problem Relation Age of Onset  . Heart disease Father   . Kidney failure Father   . Kidney disease Father   . Diabetes Maternal Grandmother   . Breast cancer Maternal Grandmother   . Valvular heart disease Mother   . Liver cancer Paternal Uncle   . Liver cancer Paternal Grandmother   . Prostate cancer Neg Hx     Allergies  Allergen Reactions  . Morphine Itching    Medication list reviewed and updated in full in [place].  ROS: GEN:  Acute illness details above GI: Tolerating PO intake GU: maintaining adequate hydration and urination Pulm: No SOB Interactive and getting along well at home.  Otherwise, ROS is as per the HPI.  Objective:   BP 118/60   Pulse 69   Temp 98.3 F (36.8 C) (Oral)   Ht _0  (1.803 m)   Wt 281 lb (127.5 kg)   BMI 39.19 kg/m    Gen: WDWN, NAD; alert,appropriate and cooperative throughout exam    HEENT: Normocephalic and atraumatic. Throat clear, w/o exudate, no LAD, R TM clear, L TM - good landmarks, No fluid present. rhinnorhea.  Left frontal and maxillary sinuses: Tender, max Right frontal and maxillary sinuses: Tender, max   Neck: No ant or post LAD  CV: RRR, No M/G/R  Pulm: Breathing comfortably in no resp distress. no w/c/r  Abd: S,NT,ND,+BS Extr: no c/c/e Psych: full affect, pleasant    Laboratory and Imaging Data:  Assessment and Plan:   Acute non-recurrent maxillary sinusitis  History of renal transplant  Immunosuppressed status (HCC)  High risk patient.  Treat the patient with Augmentin.  Supportive care and Hycodan as needed.  Follow-up: No Follow-up on file.  Meds ordered this encounter  Medications  . DISCONTD: HYDROcodone-homatropine (HYCODAN) 5-1.5 MG/5ML syrup    Sig: 1 tsp po at night before bed prn cough    Dispense:  120 mL     Refill:  0  . DISCONTD: amoxicillin (AMOXIL) 500 MG capsule    Sig: Take 2 capsules (1,000 mg total) by mouth 2 (two) times daily for 10 days.    Dispense:  40 capsule    Refill:  0  . amoxicillin-clavulanate (AUGMENTIN) 875-125 MG tablet    Sig: Take 1 tablet by mouth 2 (two) times daily for 10 days.    Dispense:  20 tablet    Refill:  0  . HYDROcodone-homatropine (HYCODAN) 5-1.5 MG/5ML syrup    Sig: 1 tsp po at night before bed prn cough    Dispense:  120 mL    Refill:  0   Signed,  Cheyeanne Roadcap T. Jacquelinne Speak, MD   Allergies as of 03/19/2017      Reactions   Morphine Itching      Medication List        Accurate as of 03/19/17  1:32 PM. Always use your most recent med list.          albuterol 108 (90 Base) MCG/ACT inhaler Commonly known as:  PROVENTIL HFA;VENTOLIN HFA Inhale 2 puffs every 6 (six) hours as needed into the lungs for wheezing or shortness of breath.   amLODipine 5 MG tablet Commonly known as:  NORVASC TAKE 1 TABLET BY MOUTH DAILY   amoxicillin-clavulanate 875-125 MG tablet Commonly known as:  AUGMENTIN Take 1 tablet by mouth 2 (two) times daily for 10 days.   beclomethasone 80 MCG/ACT inhaler Commonly known as:  QVAR Inhale 2 puffs into the lungs 2 (two) times daily.   benzonatate 200 MG capsule Commonly known as:  TESSALON Take 1 capsule (200 mg total) 3 (three) times daily as needed by mouth for cough.   buPROPion 75 MG tablet Commonly known as:  WELLBUTRIN Take 1 tablet (75 mg total) by mouth 2 (two) times daily.   ELIQUIS 5 MG Tabs tablet Generic drug:  apixaban TAKE 1 TABLET BY MOUTH 2 TIMES DAILY   furosemide 40 MG tablet Commonly known as:  LASIX Take 1 tablet (40 mg total) by mouth 2 (two) times daily. Take an  additional 68m in the PM as needed for weight gain.   HYDROcodone-homatropine 5-1.5 MG/5ML syrup Commonly known as:  HYCODAN 1 tsp po at night before bed prn cough   insulin glargine 100 unit/mL Sopn Commonly known as:   LANTUS Inject 0.1 mLs (10 Units total) into the skin at bedtime. Currently using 15 units   lisinopril 40 MG tablet Commonly known as:  PRINIVIL,ZESTRIL TAKE 1 TABLET BY MOUTH DAILY   metolazone 5 MG tablet Commonly known as:  ZAROXOLYN TAKE 1 TABLET BY MOUTH AS NEEDED   mometasone 50 MCG/ACT nasal spray Commonly known as:  NASONEX PLACE 2 SPRAYS INTO THE NOSE AT BEDTIME.   multivitamin with minerals Tabs tablet Take 1 tablet by mouth daily.   MYFORTIC 180 MG EC tablet Generic drug:  mycophenolate Take 360 mg by mouth 2 (two) times daily.   omeprazole 20 MG capsule Commonly known as:  PRILOSEC TAKE 1 CAPSULE BY MOUTH ONCE DAILY   potassium chloride 10 MEQ CR capsule Commonly known as:  MICRO-K Take 20 mEq by mouth 2 (two) times daily.   rosuvastatin 10 MG tablet Commonly known as:  CRESTOR Take 10 mg by mouth daily.   rosuvastatin 10 MG tablet Commonly known as:  CRESTOR TAKE 1 TABLET BY MOUTH DAILY   sildenafil 100 MG tablet Commonly known as:  VIAGRA Take 0.5-1 tablets (50-100 mg total) by mouth daily as needed for erectile dysfunction.   spironolactone 25 MG tablet Commonly known as:  ALDACTONE Take 1 tablet (25 mg total) by mouth daily.   sulfamethoxazole-trimethoprim 400-80 MG tablet Commonly known as:  BACTRIM,SEPTRA Take 1 tablet by mouth 3 (three) times a week.   tacrolimus 0.5 MG capsule Commonly known as:  PROGRAF Take 1 mg by mouth 2 (two) times daily.

## 2017-03-19 NOTE — Telephone Encounter (Signed)
Left  Message  On  Patients  Answering  Machine  To  Discuss   Symptoms

## 2017-03-27 DIAGNOSIS — G4733 Obstructive sleep apnea (adult) (pediatric): Secondary | ICD-10-CM | POA: Diagnosis not present

## 2017-03-27 DIAGNOSIS — I482 Chronic atrial fibrillation: Secondary | ICD-10-CM | POA: Diagnosis not present

## 2017-03-27 DIAGNOSIS — E662 Morbid (severe) obesity with alveolar hypoventilation: Secondary | ICD-10-CM | POA: Diagnosis not present

## 2017-03-28 ENCOUNTER — Telehealth: Payer: Self-pay | Admitting: Internal Medicine

## 2017-03-28 NOTE — Telephone Encounter (Signed)
Patient states that Dr Silvio Pate will refill again, please advise

## 2017-03-28 NOTE — Telephone Encounter (Signed)
Needs appt for re-eval if not improving after antibiotics.

## 2017-03-28 NOTE — Telephone Encounter (Signed)
Please check on him Monday morning. He would have had 10 days of augmentin--if that didtn' help, a refill isn't necessarily the right idea. Given his immunosuppresion, if he is not better---he will probably need to be seen

## 2017-03-28 NOTE — Telephone Encounter (Signed)
Dr. Silvio Pate is out of the office until next week. Will forward to Dr. Silvio Pate.

## 2017-03-28 NOTE — Telephone Encounter (Signed)
Copied from [place] [id number]. Topic: Quick Communication - See Telephone Encounter >> Mar 28, 2017 11:59 AM Conception Chancy, NT wrote: CRM for notification. See Telephone encounter for:  03/28/17.  Patient states he is still not feeling well from being seen on 12/26 and he is requesting a refill on augmentin and  hydrocodone cough syrup. Please advise. If can not refill please contact pt.

## 2017-03-31 ENCOUNTER — Emergency Department: Payer: 59

## 2017-03-31 ENCOUNTER — Other Ambulatory Visit: Payer: Self-pay

## 2017-03-31 ENCOUNTER — Emergency Department
Admission: EM | Admit: 2017-03-31 | Discharge: 2017-03-31 | Disposition: A | Discharge status: Home / Self Care | Payer: 59 | Attending: Emergency Medicine | Admitting: Emergency Medicine

## 2017-03-31 DIAGNOSIS — I132 Hypertensive heart and chronic kidney disease with heart failure and with stage 5 chronic kidney disease, or end stage renal disease: Secondary | ICD-10-CM | POA: Diagnosis not present

## 2017-03-31 DIAGNOSIS — R509 Fever, unspecified: Secondary | ICD-10-CM | POA: Diagnosis present

## 2017-03-31 DIAGNOSIS — N186 End stage renal disease: Secondary | ICD-10-CM | POA: Insufficient documentation

## 2017-03-31 DIAGNOSIS — E119 Type 2 diabetes mellitus without complications: Secondary | ICD-10-CM | POA: Insufficient documentation

## 2017-03-31 DIAGNOSIS — B349 Viral infection, unspecified: Secondary | ICD-10-CM | POA: Insufficient documentation

## 2017-03-31 DIAGNOSIS — Z940 Kidney transplant status: Secondary | ICD-10-CM | POA: Insufficient documentation

## 2017-03-31 DIAGNOSIS — R05 Cough: Secondary | ICD-10-CM | POA: Diagnosis not present

## 2017-03-31 DIAGNOSIS — Z87891 Personal history of nicotine dependence: Secondary | ICD-10-CM | POA: Insufficient documentation

## 2017-03-31 DIAGNOSIS — Z79899 Other long term (current) drug therapy: Secondary | ICD-10-CM | POA: Diagnosis not present

## 2017-03-31 DIAGNOSIS — R0981 Nasal congestion: Secondary | ICD-10-CM | POA: Diagnosis not present

## 2017-03-31 DIAGNOSIS — R197 Diarrhea, unspecified: Secondary | ICD-10-CM | POA: Insufficient documentation

## 2017-03-31 DIAGNOSIS — J45909 Unspecified asthma, uncomplicated: Secondary | ICD-10-CM | POA: Diagnosis not present

## 2017-03-31 DIAGNOSIS — I5032 Chronic diastolic (congestive) heart failure: Secondary | ICD-10-CM | POA: Diagnosis not present

## 2017-03-31 LAB — COMPREHENSIVE METABOLIC PANEL
ALBUMIN: 3.7 g/dL (ref 3.5–5.0)
ALT: 15 U/L — ABNORMAL LOW (ref 17–63)
ANION GAP: 5 (ref 5–15)
AST: 27 U/L (ref 15–41)
Alkaline Phosphatase: 125 U/L (ref 38–126)
BILIRUBIN TOTAL: 1.1 mg/dL (ref 0.3–1.2)
BUN: 22 mg/dL — ABNORMAL HIGH (ref 6–20)
CHLORIDE: 102 mmol/L (ref 101–111)
CO2: 31 mmol/L (ref 22–32)
Calcium: 9.4 mg/dL (ref 8.9–10.3)
Creatinine, Ser: 1.12 mg/dL (ref 0.61–1.24)
GFR calc Af Amer: >60 mL/min (ref >60)
GFR calc non Af Amer: >60 mL/min (ref >60)
GLUCOSE: 124 mg/dL — AB (ref 65–99)
POTASSIUM: 4.1 mmol/L (ref 3.5–5.1)
Sodium: 138 mmol/L (ref 135–145)
Total Protein: 8.2 g/dL — ABNORMAL HIGH (ref 6.5–8.1)

## 2017-03-31 LAB — URINALYSIS, COMPLETE (UACMP) WITH MICROSCOPIC
Bacteria, UA: NONE SEEN
Bilirubin Urine: NEGATIVE
Glucose, UA: NEGATIVE mg/dL
HGB URINE DIPSTICK: NEGATIVE
Ketones, ur: NEGATIVE mg/dL
LEUKOCYTES UA: NEGATIVE
Nitrite: NEGATIVE
PH: 7 (ref 5.0–8.0)
Protein, ur: 30 mg/dL — AB
SPECIFIC GRAVITY, URINE: 1.014 (ref 1.005–1.030)
Squamous Epithelial / LPF: NONE SEEN

## 2017-03-31 LAB — INFLUENZA PANEL BY PCR (TYPE A & B)
INFLAPCR: NEGATIVE
Influenza B By PCR: NEGATIVE

## 2017-03-31 LAB — CBC WITH DIFFERENTIAL/PLATELET
BASOS ABS: 0 10*3/uL (ref 0–0.1)
BASOS PCT: 1 %
EOS ABS: 0 10*3/uL (ref 0–0.7)
EOS PCT: 0 %
HEMATOCRIT: 34.2 % — AB (ref 40.0–52.0)
Hemoglobin: 11.1 g/dL — ABNORMAL LOW (ref 13.0–18.0)
Lymphocytes Relative: 27 %
Lymphs Abs: 1.6 10*3/uL (ref 1.0–3.6)
MCH: 23.1 pg — ABNORMAL LOW (ref 26.0–34.0)
MCHC: 32.4 g/dL (ref 32.0–36.0)
MCV: 71.3 fL — ABNORMAL LOW (ref 80.0–100.0)
MONO ABS: 0.4 10*3/uL (ref 0.2–1.0)
MONOS PCT: 7 %
Neutro Abs: 3.8 10*3/uL (ref 1.4–6.5)
Neutrophils Relative %: 65 %
PLATELETS: 192 10*3/uL (ref 150–440)
RBC: 4.79 MIL/uL (ref 4.40–5.90)
RDW: 18.8 % — AB (ref 11.5–14.5)
WBC: 5.8 10*3/uL (ref 3.8–10.6)

## 2017-03-31 MED ORDER — HYDROCOD POLST-CPM POLST ER 10-8 MG/5ML PO SUER: 5.0000 mL | Freq: Two times a day (BID) | ORAL | 0 refills | Status: DC | PRN

## 2017-03-31 MED ORDER — SODIUM CHLORIDE 0.9 % IV BOLUS (SEPSIS)
1000.0000 mL | Freq: Once | INTRAVENOUS | Status: AC
  Administered 2017-03-31: 1000 mL via INTRAVENOUS

## 2017-03-31 NOTE — Telephone Encounter (Signed)
Spoke to pt. He went to the ER [facility]) last night because he felt so bad.

## 2017-03-31 NOTE — ED Notes (Addendum)
See triage note  Presents with a 3 weeks hx of cough  States cough has been intermittent and prod at times  Also has been weak  Was seen by PCP and placed on antibiotics times 2   Afebrile on arrival but states he is cold

## 2017-03-31 NOTE — ED Triage Notes (Signed)
Chills and sinus drainage x 2 weeks.

## 2017-03-31 NOTE — ED Triage Notes (Signed)
Pt reports feeling weak for the last 3 weeks; has been on 2 rounds of antibiotics in the last 4 weeks; fever at home last night was 101, has not checked temp today; reports intermittent productive cough of "bright yellow" sputum; pt chilled in triage, shivering,  but oral temp 98.4

## 2017-03-31 NOTE — Discharge Instructions (Signed)
Follow-up with your primary care provider if any continued problems. Call your transplant team at [facility] if any continued or worsening symptoms. For the next 12 hours remain on clear liquids as discussed. When diarrhea has decreased or stopped human May begin with bananas, rice, applesauce or taste. Advance your diet from there if no diarrhea. Tussionex as needed for cough.

## 2017-03-31 NOTE — ED Provider Notes (Signed)
[facility] Emergency Department Provider Note  ____________________________________________   First MD Initiated Contact with Patient 03/31/17 [phone]     (approximate)  I have reviewed the triage vital signs and the nursing notes.   HISTORY  Chief Complaint Weakness; Fever; Cough; and Headache   HPI [name] is a 57 y.o. male is here with  complains of chills and sinus drainage for 2 weeks. Patient states that originally this started approximately 4 weeks ago. Initially he was given amoxicillin to treat for a sinus infection and when he did not improve on this he was given Augmentin. He states that he had temperature 100.1 last evening He reports intermittent productive cough. He also gives history of watery diarrhea5 intermittently for the last 2 weeks. atient has not called his transplant coordinator at [facility] with the symptoms. No one in the family is sick with similar symptoms at this time.   Past Medical History:  Diagnosis Date  . Allergy    seasonal  . Amnestic MCI (mild cognitive impairment with memory loss)   . Asthma   . Atrial fibrillation ([place])   . Atrial fibrillation ([place]) 2016  . Benign neoplasm of colon   . CAD (coronary artery disease) 2005   had CABG  . CHF (congestive heart failure) ([place])   . Chronic venous stasis dermatitis of both lower extremities   . Cytomegaloviral disease ([place]) 2017  . Depression   . Diabetes mellitus   . Diverticulosis   . Dyspnea   . Erectile dysfunction   . ESRD (end stage renal disease) ([place]) 2005   Dialysis then renal transplant  . GERD (gastroesophageal reflux disease)   . Gout   . Hyperlipidemia    low since renal failure  . Hypertension   . Kidney transplant status, cadaveric 2012  . Obesity   . Pneumonia   . Purpura ([place])   . Sleep apnea    bipap with oxygen 2 l  . Trigger finger   . Ulcer 05/2016   Left shin  . Wears glasses     Patient Active Problem List   Diagnosis Date  Noted  . Cough 02/10/2017  . Chronic diastolic heart failure ([place]) 11/19/2016  . Lump 10/14/2016  . Chronic venous insufficiency 06/05/2016  . Chronic ulcer of left leg ([place]) 10/26/2015  . Cytomegalovirus (CMV) viremia ([place]) 09/07/2015  . Proteinuria 06/23/2015  . Hypokalemia 10/13/2014  . BPH with obstruction/lower urinary tract symptoms 09/25/2014  . Hypogonadism in male 09/25/2014  . Immunosuppressed status ([place]) 09/09/2014  . Chronic atrial fibrillation ([place]) 07/18/2014  . Bariatric surgery status 04/25/2014  . DM eye manif type II 10/27/2013  . MDD (recurrent major depressive disorder) in remission ([place]) 10/04/2013  . Benign neoplasm of colon 04/22/2013  . Diverticulosis of colon (without mention of hemorrhage) 04/22/2013  . History of renal transplant 03/01/2013  . Asthma, persistent controlled 02/25/2013  . Erectile dysfunction 10/20/2012  . Obstructive sleep apnea 09/29/2012  . Gout 09/13/2012  . Obesity hypoventilation syndrome ([place]) 03/31/2012  . Routine general medical examination at a health care facility 11/12/2011  . Type 2 diabetes, controlled, with renal manifestation ([place]) 03/01/2011  . Essential (primary) hypertension 12/17/2010  . H/O kidney transplant 12/17/2010  . CAD, multiple vessel 12/17/2010  . Hyperlipidemia   . ESRD (end stage renal disease) [facility])     Past Surgical History:  Procedure Laterality Date  . BARIATRIC SURGERY    . CARDIAC CATHETERIZATION     [facility]  .  COLONOSCOPY WITH PROPOFOL N/A 04/22/2013   Procedure: COLONOSCOPY WITH PROPOFOL;  Surgeon: [name], MD;  Location: [facility];  Service: Endoscopy;  Laterality: N/A;  . CORONARY ARTERY BYPASS GRAFT  2005   X 4  . DG ANGIO AV SHUNT*L*     right and left upper arms  . FASCIOTOMY  03/03/2012   Procedure: FASCIOTOMY;  Surgeon: [name], MD;  Location: [facility];  Service: Orthopedics;  Laterality: Right;  FASCIOTOMY RIGHT SMALL FINGER  . FASCIOTOMY Left 08/17/2013    Procedure: FASCIOTOMY LEFT RING;  Surgeon: [name], MD;  Location: [facility];  Service: Orthopedics;  Laterality: Left;  . INCISION AND DRAINAGE ABSCESS Left 10/15/2015   Procedure: INCISION AND DRAINAGE ABSCESS;  Surgeon: [name], MD;  Location: [facility];  Service: General;  Laterality: Left;  . KIDNEY TRANSPLANT  09/13/2010   cadaver--at [facility]  . RIGHT HEART CATH N/A 11/15/2016   Procedure: RIGHT HEART CATH;  Surgeon: [name], MD;  Location: [facility];  Service: Cardiovascular;  Laterality: N/A;  . TYMPANIC MEMBRANE REPAIR  1/12   left  . VASECTOMY      Prior to Admission medications   Medication Sig Start Date End Date Taking? Authorizing Provider  albuterol (PROVENTIL HFA;VENTOLIN HFA) 108 (90 Base) MCG/ACT inhaler Inhale 2 puffs every 6 (six) hours as needed into the lungs for wheezing or shortness of breath. 02/10/17   [name], MD  amLODipine (NORVASC) 5 MG tablet TAKE 1 TABLET BY MOUTH DAILY 06/21/16   [name], MD  beclomethasone (QVAR) 80 MCG/ACT inhaler Inhale 2 puffs into the lungs 2 (two) times daily. 05/10/16   [name], MD  buPROPion (WELLBUTRIN) 75 MG tablet Take 1 tablet (75 mg total) by mouth 2 (two) times daily. 02/21/16   [name], MD  chlorpheniramine-HYDROcodone (TUSSIONEX PENNKINETIC ER) 10-8 MG/5ML SUER Take 5 mLs by mouth every 12 (twelve) hours as needed for cough. 03/31/17   Summers, Rhonda L, PA-C  ELIQUIS 5 MG TABS tablet TAKE 1 TABLET BY MOUTH 2 TIMES DAILY 03/06/17   [name], MD  furosemide (LASIX) 40 MG tablet Take 1 tablet (40 mg total) by mouth 2 (two) times daily. Take an additional 13m in the PM as needed for weight gain. 01/20/17 04/20/17  HAlisa Graff FNP  HYDROcodone-homatropine (HYCODAN) 5-1.5 MG/5ML syrup 1 tsp po at night before bed prn cough 03/19/17   Copland, SFrederico Hamman MD  insulin glargine (LANTUS) 100 unit/mL SOPN Inject 0.1 mLs (10 Units total) into the  skin at bedtime. Currently using 15 units Patient taking differently: Inject 10 Units into the skin at bedtime. Currently using 10 units 11/16/16   [name] MD  lisinopril (PRINIVIL,ZESTRIL) 40 MG tablet TAKE 1 TABLET BY MOUTH DAILY 11/04/16   AWellington Hampshire MD  metolazone (ZAROXOLYN) 5 MG tablet TAKE 1 TABLET BY MOUTH AS NEEDED 03/13/17   HDarylene PriceA, FNP  mometasone (NASONEX) 50 MCG/ACT nasal spray PLACE 2 SPRAYS INTO THE NOSE AT BEDTIME. 11/26/16   LVenia Carbon MD  Multiple Vitamin (MULTIVITAMIN WITH MINERALS) TABS tablet Take 1 tablet by mouth daily.    [provider]  mycophenolate (MYFORTIC) 180 MG EC tablet Take 360 mg by mouth 2 (two) times daily.     [provider]  omeprazole (PRILOSEC) 20 MG capsule TAKE 1 CAPSULE BY MOUTH ONCE DAILY 02/21/16   LVenia Carbon MD  potassium  chloride (MICRO-K) 10 MEQ CR capsule Take 20 mEq by mouth 2 (two) times daily.    [provider]  rosuvastatin (CRESTOR) 10 MG tablet Take 10 mg by mouth daily.    [provider]  rosuvastatin (CRESTOR) 10 MG tablet TAKE 1 TABLET BY MOUTH DAILY 03/13/17   [name], MD  sildenafil (VIAGRA) 100 MG tablet Take 0.5-1 tablets (50-100 mg total) by mouth daily as needed for erectile dysfunction. 01/03/17   [name], MD  spironolactone (ALDACTONE) 25 MG tablet Take 1 tablet (25 mg total) by mouth daily. 11/16/16 11/16/17  Fritzi Mandes, MD  sulfamethoxazole-trimethoprim (BACTRIM,SEPTRA) 400-80 MG tablet Take 1 tablet by mouth 3 (three) times a week.    [provider]  tacrolimus (PROGRAF) 0.5 MG capsule Take 1 mg by mouth 2 (two) times daily.    [provider]    Allergies Morphine  Family History  Problem Relation Age of Onset  . Heart disease Father   . Kidney failure Father   . Kidney disease Father   . Diabetes Maternal Grandmother   . Breast cancer Maternal Grandmother   . Valvular heart disease Mother   . Liver cancer  Paternal Uncle   . Liver cancer Paternal Grandmother   . Prostate cancer Neg Hx     Social History Social History   Tobacco Use  . Smoking status: Former Smoker    Types: Cigars    Last attempt to quit: 09/02/1994    Years since quitting: 22.5  . Smokeless tobacco: Never Used  Substance Use Topics  . Alcohol use: Yes    Alcohol/week: 0.0 - 0.6 oz    Comment: occasional- 3 in the past year  . Drug use: No    Review of Systems Constitutional: positive fever/chills Eyes: No visual changes. ENT: No sore throat. Positive for nasal congestion. Cardiovascular: Denies chest pain. Respiratory: Denies shortness of breath. Positive for productive cough. Gastrointestinal: No abdominal pain.  No nausea, no vomiting.  positive diarrhea.  No constipation. Genitourinary: Negative for dysuria. Musculoskeletal: Negative for back pain. Skin: Negative for rash. Neurological: Negative for headaches, focal weakness or numbness. ____________________________________________   PHYSICAL EXAM:  VITAL SIGNS: ED Triage Vitals  Enc Vitals Group     BP 03/31/17 0621 (!) 174/72     Pulse Rate 03/31/17 0621 69     Resp 03/31/17 0621 20     Temp 03/31/17 0621 98.4 F (36.9 C)     Temp Source 03/31/17 0621 Oral     SpO2 03/31/17 0621 95 %     Weight 03/31/17 0622 281 lb (127.5 kg)     Height 03/31/17 0622 _0  (1.803 m)     Head Circumference --      Peak Flow --      Pain Score 03/31/17 0620 5     Pain Loc --      Pain Edu? --      Excl. in [place]? --    Constitutional: Alert and oriented. Well appearing and in no acute distress. Eyes: Conjunctivae are normal.  Head: Atraumatic. Nose: mild congestion/no rhinnorhea.  TMs are dull bilaterally. Mouth/Throat: Mucous membranes are moist.  Oropharynx non-erythematous. Posterior drainage noted. No exudate. Neck: No stridor.   Hematological/Lymphatic/Immunilogical: No cervical lymphadenopathy. Cardiovascular: Normal rate, regular rhythm. Grossly  normal heart sounds.  Good peripheral circulation. Respiratory: Normal respiratory effort.  No retractions. Lungs CTAB. Gastrointestinal: Soft and nontender. No distention.  Musculoskeletal: moves upper and lower extremities without any difficulty.  Patient is able to ambulate without assistance at times is sitting in the chair next to the stretcher. Neurologic:  Normal speech and language. No gross focal neurologic deficits are appreciated. No gait instability. Skin:  Skin is warm, dry and intact. No rash noted. Psychiatric: Mood and affect are normal. Speech and behavior are normal.  ____________________________________________   LABS (all labs ordered are listed, but only abnormal results are displayed)  Labs Reviewed  CBC WITH DIFFERENTIAL/PLATELET - Abnormal; Notable for the following components:      Result Value   Hemoglobin 11.1 (*)    HCT 34.2 (*)    MCV 71.3 (*)    MCH 23.1 (*)    RDW 18.8 (*)    All other components within normal limits  COMPREHENSIVE METABOLIC PANEL - Abnormal; Notable for the following components:   Glucose, Bld 124 (*)    BUN 22 (*)    Total Protein 8.2 (*)    ALT 15 (*)    All other components within normal limits  URINALYSIS, COMPLETE (UACMP) WITH MICROSCOPIC - Abnormal; Notable for the following components:   Color, Urine YELLOW (*)    APPearance CLEAR (*)    Protein, ur 30 (*)    All other components within normal limits  INFLUENZA PANEL BY PCR (TYPE A & B)  CMV QUANT DNA PCR (URINE)  CMV DNA, QUANTITATIVE, PCR  CBG MONITORING, ED    RADIOLOGY  Dg Chest 2 View  Result Date: 03/31/2017 CLINICAL DATA:  Cough.  Sinus drainage. EXAM: CHEST  2 VIEW COMPARISON:  02/10/2017 FINDINGS: Midline trachea. Moderate cardiomegaly. Tortuous, possibly ectatic thoracic aorta. Similar over multiple prior exams including back to 05/03/2013. No pleural effusion or pneumothorax. No congestive failure. Low lung volumes with resultant pulmonary interstitial  prominence. No lobar consolidation. IMPRESSION: Cardiomegaly and low lung volumes.  No acute findings. Electronically Signed   By: Abigail Miyamoto M.D.   On: 03/31/2017 07:50    ____________________________________________   PROCEDURES  Procedure(s) performed: None  Procedures  Critical Care performed: No  ____________________________________________   INITIAL IMPRESSION / ASSESSMENT AND PLAN / ED COURSE Discussed lab findings with the transplant team at [facility]. They would like to have a CMP PCR test done which was sent. Laboratory reports that this is a send out and that it will not be resulted for approximately 4 days. Transplant team recommended that we treat the symptoms of cough and diarrhea at this time Should symptoms get worse are not resolving he is to call the transplant team. Patient was given a prescription for tussionex1 teaspoon twice a day as needed for cough. Increase fluids. Patient may also follow-up with his local doctoras needed.  Patient and wife voices understanding.  ____________________________________________   FINAL CLINICAL IMPRESSION(S) / ED DIAGNOSES  Final diagnoses:  Viral illness  Diarrhea, unspecified type     ED Discharge Orders        Ordered    chlorpheniramine-HYDROcodone (TUSSIONEX PENNKINETIC ER) 10-8 MG/5ML SUER  Every 12 hours PRN     03/31/17 1206       Note:  This document was prepared using Dragon voice recognition software and may include unintentional dictation errors.    [name], PA-C 03/31/17 1305    Harvest Dark, MD 03/31/17 [phone]

## 2017-03-31 NOTE — Telephone Encounter (Signed)
CXR normal and diagnosed with viral infection. Given tussionex for cough and recommended fluids for his diarrhea. They spoke to his transplant team as well  Please check on him again Monday to be sure he is improving

## 2017-04-01 LAB — CMV DNA, QUANTITATIVE, PCR
CMV DNA QUANT: NEGATIVE [IU]/mL
Log10 CMV Qn DNA Pl: UNDETERMINED log10 IU/mL

## 2017-04-03 LAB — CMV QUANT DNA PCR (URINE)
CMV QUANT DNA PCR (URINE): 1500 {copies}/mL
Log10 CMV Qn DCA Ur: 3.176 log10copy/mL

## 2017-04-03 NOTE — Telephone Encounter (Signed)
Called to check on pt, again. He said he still has very little energy. Said the cough medicine is working for his cough.

## 2017-04-07 ENCOUNTER — Other Ambulatory Visit: Payer: Self-pay | Admitting: Cardiovascular Disease

## 2017-04-08 ENCOUNTER — Ambulatory Visit (INDEPENDENT_AMBULATORY_CARE_PROVIDER_SITE_OTHER): Payer: 59 | Admitting: Cardiovascular Disease

## 2017-04-08 ENCOUNTER — Encounter: Payer: Self-pay | Admitting: Cardiovascular Disease

## 2017-04-08 VITALS — BP 144/82 | HR 56 | Ht 71.0 in | Wt 274.5 lb

## 2017-04-08 DIAGNOSIS — I482 Chronic atrial fibrillation, unspecified: Secondary | ICD-10-CM

## 2017-04-08 DIAGNOSIS — I5022 Chronic systolic (congestive) heart failure: Secondary | ICD-10-CM

## 2017-04-08 DIAGNOSIS — E785 Hyperlipidemia, unspecified: Secondary | ICD-10-CM | POA: Diagnosis not present

## 2017-04-08 DIAGNOSIS — I2510 Atherosclerotic heart disease of native coronary artery without angina pectoris: Secondary | ICD-10-CM | POA: Diagnosis not present

## 2017-04-08 MED ORDER — APIXABAN 5 MG PO TABS: 5.0000 mg | ORAL_TABLET | Freq: Two times a day (BID) | ORAL | 3 refills | Status: DC

## 2017-04-08 NOTE — Progress Notes (Signed)
Cardiology Office Note   Date:  04/08/2017   ID:  [name] 03-07-1961, MRN [id number]  PCP:  [name], MD  Cardiologist:   [name], MD   Chief Complaint  Patient presents with  . other    Pt. was at [facility] ER with a viral infection and is still c/o a cough. Meds reviewed by the pt. verbally.       History of Present Illness: [name] is a 57 y.o. male who presents for  a follow up visit. He has known history of coronary artery disease status post CABG in 2004 with no recurrent ischemic events since then, chronic systolic heart failure with ejection fraction of 35-40 %, type 2 diabetes, end-stage renal disease previously on hemodialysis from 2004 - 2012 when he got a kidney transplant, hypertension, hyperlipidemia, chronic atrial fibrillation and sleep apnea.   She underwent gastric bypass in September 4193 without complications.  He did have significant bradycardia while he was on small dose carvedilol which was discontinued.  He had significant worsening of right-sided heart failure last year.  I repeated his echocardiogram that was a difficult study overall. It showed an EF of 79%, diastolic function.  He was hospitalized in August for that reason for IV diuresis which gradually improved.  Right heart catheterization showed severely elevated filling pressures, severe pulmonary hypertension and low normal cardiac output after several days of diuresis.  His pulmonary wedge pressure was 31 mmHg.  He was diuresed with furosemide drip.  His weight at the time of discharge was 277 pounds. He has been following with the heart failure clinic since then. He has been doing reasonably well with stable weight.  His weight is around 265-275 pounds at home.  He takes furosemide 40 mg twice daily and uses metolazone as needed for weight gain and fluid overload.  He has not had to use it in the last 2 weeks. He reports no chest pain and significant improvement in  exertional dyspnea.  He has been able to go to exercise regularly until recently when he developed a viral illness.  That has improved significantly over the last few days.   Past Medical History:  Diagnosis Date  . Allergy    seasonal  . Amnestic MCI (mild cognitive impairment with memory loss)   . Asthma   . Atrial fibrillation ([place])   . Atrial fibrillation ([place]) 2016  . Benign neoplasm of colon   . CAD (coronary artery disease) 2005   had CABG  . CHF (congestive heart failure) ([place])   . Chronic venous stasis dermatitis of both lower extremities   . Cytomegaloviral disease ([place]) 2017  . Depression   . Diabetes mellitus   . Diverticulosis   . Dyspnea   . Erectile dysfunction   . ESRD (end stage renal disease) ([place]) 2005   Dialysis then renal transplant  . GERD (gastroesophageal reflux disease)   . Gout   . Hyperlipidemia    low since renal failure  . Hypertension   . Kidney transplant status, cadaveric 2012  . Obesity   . Pneumonia   . Purpura ([place])   . Sleep apnea    bipap with oxygen 2 l  . Trigger finger   . Ulcer 05/2016   Left shin  . Wears glasses     Past Surgical History:  Procedure Laterality Date  . BARIATRIC SURGERY    . CARDIAC CATHETERIZATION     [facility]  . COLONOSCOPY WITH  PROPOFOL N/A 04/22/2013   Procedure: COLONOSCOPY WITH PROPOFOL;  Surgeon: [name], MD;  Location[facility];  Service: Endoscopy;  Laterality: N/A;  . CORONARY ARTERY BYPASS GRAFT  2005   X 4  . DG ANGIO AV SHUNT*L*     right and left upper arms  . FASCIOTOMY  03/03/2012   Procedure: FASCIOTOMY;  Surgeon: [name], MD;  Location: [place];  Service: Orthopedics;  Laterality: Right;  FASCIOTOMY RIGHT SMALL FINGER  . FASCIOTOMY Left 08/17/2013   Procedure: FASCIOTOMY LEFT RING;  Surgeon: [name], MD;  Location: [place];  Service: Orthopedics;  Laterality: Left;  . INCISION AND DRAINAGE ABSCESS Left 10/15/2015   Procedure: INCISION  AND DRAINAGE ABSCESS;  Surgeon: [name], MD;  Location: [facility];  Service: General;  Laterality: Left;  . KIDNEY TRANSPLANT  09/13/2010   cadaver--at [facility]  . RIGHT HEART CATH N/A 11/15/2016   Procedure: RIGHT HEART CATH;  Surgeon: [name], MD;  Location: [place] [facility];  Service: Cardiovascular;  Laterality: N/A;  . TYMPANIC MEMBRANE REPAIR  1/12   left  . VASECTOMY       Current Outpatient Medications  Medication Sig Dispense Refill  . albuterol (PROVENTIL HFA;VENTOLIN HFA) 108 (90 Base) MCG/ACT inhaler Inhale 2 puffs every 6 (six) hours as needed into the lungs for wheezing or shortness of breath. 1 Inhaler 2  . amLODipine (NORVASC) 5 MG tablet TAKE 1 TABLET BY MOUTH DAILY 90 tablet 3  . beclomethasone (QVAR) 80 MCG/ACT inhaler Inhale 2 puffs into the lungs 2 (two) times daily. 1 Inhaler 6  . buPROPion (WELLBUTRIN) 75 MG tablet Take 1 tablet (75 mg total) by mouth 2 (two) times daily. 180 tablet 3  . chlorpheniramine-HYDROcodone (TUSSIONEX PENNKINETIC ER) 10-8 MG/5ML SUER Take 5 mLs by mouth every 12 (twelve) hours as needed for cough. 140 mL 0  . ELIQUIS 5 MG TABS tablet TAKE 1 TABLET BY MOUTH 2 TIMES DAILY 60 tablet 3  . furosemide (LASIX) 40 MG tablet Take 1 tablet (40 mg total) by mouth 2 (two) times daily. Take an additional 18m in the PM as needed for weight gain. 100 tablet 3  . insulin glargine (LANTUS) 100 unit/mL SOPN Inject 0.1 mLs (10 Units total) into the skin at bedtime. Currently using 15 units (Patient taking differently: Inject 10 Units into the skin at bedtime. Currently using 10 units) 15 mL 11  . lisinopril (PRINIVIL,ZESTRIL) 40 MG tablet TAKE 1 TABLET BY MOUTH DAILY 30 tablet 3  . metolazone (ZAROXOLYN) 5 MG tablet TAKE 1 TABLET BY MOUTH AS NEEDED 15 tablet 0  . mometasone (NASONEX) 50 MCG/ACT nasal spray PLACE 2 SPRAYS INTO THE NOSE AT BEDTIME. 17 g 11  . Multiple Vitamin (MULTIVITAMIN WITH MINERALS) TABS tablet Take 1 tablet by mouth daily.      . mycophenolate (MYFORTIC) 180 MG EC tablet Take 360 mg by mouth 2 (two) times daily.     .Marland Kitchenomeprazole (PRILOSEC) 20 MG capsule TAKE 1 CAPSULE BY MOUTH ONCE DAILY 90 capsule 3  . potassium chloride (MICRO-K) 10 MEQ CR capsule Take 20 mEq by mouth 2 (two) times daily.    . rosuvastatin (CRESTOR) 10 MG tablet Take 10 mg by mouth daily.    . sildenafil (VIAGRA) 100 MG tablet Take 0.5-1 tablets (50-100 mg total) by mouth daily as needed for erectile dysfunction. 10 tablet 11  . spironolactone (ALDACTONE) 25 MG tablet Take 1 tablet (25 mg  total) by mouth daily. 30 tablet 11  . sulfamethoxazole-trimethoprim (BACTRIM,SEPTRA) 400-80 MG tablet Take 1 tablet by mouth 3 (three) times a week.    . tacrolimus (PROGRAF) 0.5 MG capsule Take 1 mg by mouth 2 (two) times daily.     No current facility-administered medications for this visit.     Allergies:   Morphine    Social History:  The patient  reports that he quit smoking about 22 years ago. His smoking use included cigars. he has never used smokeless tobacco. He reports that he drinks alcohol. He reports that he does not use drugs.   Family History:  The patient's family history includes Breast cancer in his maternal grandmother; Diabetes in his maternal grandmother; Heart disease in his father; Kidney disease in his father; Kidney failure in his father; Liver cancer in his paternal grandmother and paternal uncle; Valvular heart disease in his mother.    ROS:  Please see the history of present illness.   Otherwise, review of systems are positive for none.   All other systems are reviewed and negative.    PHYSICAL EXAM: VS:  BP (!) 144/82 (BP Location: Right Arm, Patient Position: Sitting, Cuff Size: Normal)   Pulse (!) 56   Ht _0  (1.803 m)   Wt 274 lb 8 oz (124.5 kg)   BMI 38.28 kg/m  , BMI Body mass index is 38.28 kg/m. GEN: Well nourished, well developed, in no acute distress  HEENT: normal  Neck: No JVD,  no carotid bruits, or  masses Cardiac: Irregularly irregular; no  rubs, or gallops, trace leg  edema . 2/6 systolic ejection murmur in the aortic area. Respiratory:  clear to auscultation bilaterally, normal work of breathing GI: soft, nontender, , + BS.  Nondistended. MS: no deformity or atrophy  Skin: warm and dry, no rash Neuro:  Strength and sensation are intact Psych: euthymic mood, full affect   EKG:  EKG is ordered today. The ekg ordered today demonstrates atrial fibrillation with nonspecific IVCD.  Ventricular rate is 56 bpm.   Recent Labs: 11/11/2016: TSH 2.513 11/16/2016: Magnesium 1.7 01/20/2017: B Natriuretic Peptide 363.0 03/31/2017: ALT 15; BUN 22; Creatinine, Ser 1.12; Hemoglobin 11.1; Platelets 192; Potassium 4.1; Sodium 138    Lipid Panel    Component Value Date/Time   CHOL 92 03/06/2016 0736   CHOL 161 03/07/2015 0823   TRIG 73 03/06/2016 0736   HDL 34 (L) 03/06/2016 0736   HDL 38 (L) 03/07/2015 0823   CHOLHDL 2.7 03/06/2016 0736   VLDL 15 03/06/2016 0736   LDLCALC 43 03/06/2016 0736   LDLCALC 105 (H) 03/07/2015 0823   LDLDIRECT 41.9 04/20/2012 1618      Wt Readings from Last 3 Encounters:  04/08/17 274 lb 8 oz (124.5 kg)  03/31/17 281 lb (127.5 kg)  03/19/17 281 lb (127.5 kg)       No flowsheet data found.    ASSESSMENT AND PLAN:  1.  Chronic systolic heart failure:   The patient appears to be euvolemic and has been stable overall.  Renal function has been good with most recent creatinine of 1.12.  Continue same medications. He is not on a beta blocker due to previous bradycardia on carvedilol. Continue lisinopril 40 mg daily.  2. Chronic atrial fibrillation: Rate is controlled without medications. He is tolerating anticoagulation with Eliquis.  He has been in atrial fibrillation since 2016.  3. Coronary artery disease involving native coronary arteries without angina: Continue medical therapy.   4. Hyperlipidemia:  Continue treatment with rosuvastatin 10 mg daily.  Most recent LDL was 43.   5. Status post kidney transplant: Normal renal function.  Disposition:   Continue follow-up in the heart failure clinic for follow-up with me in 6 months.  Signed,  [name], MD  04/08/2017 4:37 PM    [place]

## 2017-04-08 NOTE — Patient Instructions (Signed)
Medication Instructions: Continue same medications.   Labwork: None.   Procedures/Testing: None.   Follow-Up: 6 months with Dr. Isael Stille.   Any Additional Special Instructions Will Be Listed Below (If Applicable).     If you need a refill on your cardiac medications before your next appointment, please call your pharmacy.   

## 2017-04-14 ENCOUNTER — Other Ambulatory Visit: Payer: Self-pay | Admitting: Internal Medicine

## 2017-04-27 DIAGNOSIS — I482 Chronic atrial fibrillation: Secondary | ICD-10-CM | POA: Diagnosis not present

## 2017-04-27 DIAGNOSIS — G4733 Obstructive sleep apnea (adult) (pediatric): Secondary | ICD-10-CM | POA: Diagnosis not present

## 2017-04-27 DIAGNOSIS — E662 Morbid (severe) obesity with alveolar hypoventilation: Secondary | ICD-10-CM | POA: Diagnosis not present

## 2017-05-02 ENCOUNTER — Other Ambulatory Visit: Payer: Self-pay | Admitting: Family

## 2017-05-02 MED ORDER — POTASSIUM CHLORIDE ER 10 MEQ PO CPCR: 20.0000 meq | ORAL_CAPSULE | Freq: Two times a day (BID) | ORAL | 4 refills | Status: DC

## 2017-05-13 NOTE — Progress Notes (Signed)
Patient ID: [name], male    DOB: [birth year], 57 y.o.   MRN: [id number]  HPI  [name] is a 57 y/o with a history of asthma, atrial fibrillation, CAD (CABG), chronic venous stastis of lower legs, depression, DM, renal disease (s/p renal transplant), GERD, HTN, hyperlipidemia, obstructive sleep apnea (with oxygen), remote tobacco use and chronic heart failure.   Echo done 10/16/16 showed an EF of 45-50% along with normal pulmonary artery systolic pressure. EF has improved from 35-40% back in July 2016. Right cardiac catheterization done 11/15/16 showed severely elevated filling pressure, severe pulmonary HTN and low normal cardiac output. Pulmonary wedge pressure still elevated at 31 mmHg.   Was in the ED 03/31/17 due to viral illness and diarrhea where he was treated and released.   He presents today for his follow-up visit with a chief complaint of minimal shortness of breath upon moderate exertion. He describes this as chronic in nature. Feels like his breathing is worse after he coughs quite a bit. He has associated fatigue, cough, edema, abdominal distention and light-headedness after coughing. He denies any chest pain, palpitations weight gain or difficulty sleeping. Has been treated with a couple of different antibiotics for this head cold.   Past Medical History:  Diagnosis Date  . Allergy    seasonal  . Amnestic MCI (mild cognitive impairment with memory loss)   . Asthma   . Atrial fibrillation ([place])   . Atrial fibrillation ([place]) 2016  . Benign neoplasm of colon   . CAD (coronary artery disease) 2005   had CABG  . CHF (congestive heart failure) ([place])   . Chronic venous stasis dermatitis of both lower extremities   . Cytomegaloviral disease ([place]) 2017  . Depression   . Diabetes mellitus   . Diverticulosis   . Dyspnea   . Erectile dysfunction   . ESRD (end stage renal disease) ([place]) 2005   Dialysis then renal transplant  . GERD (gastroesophageal reflux disease)   . Gout    . Hyperlipidemia    low since renal failure  . Hypertension   . Kidney transplant status, cadaveric 2012  . Obesity   . Pneumonia   . Purpura ([place])   . Sleep apnea    bipap with oxygen 2 l  . Trigger finger   . Ulcer 05/2016   Left shin  . Wears glasses    Past Surgical History:  Procedure Laterality Date  . BARIATRIC SURGERY    . CARDIAC CATHETERIZATION     [facility]  . COLONOSCOPY WITH PROPOFOL N/A 04/22/2013   Procedure: COLONOSCOPY WITH PROPOFOL;  Surgeon: [name], MD;  [facility];  Service: Endoscopy;  Laterality: N/A;  . CORONARY ARTERY BYPASS GRAFT  2005   X 4  . DG ANGIO AV SHUNT*L*     right and left upper arms  . FASCIOTOMY  03/03/2012   Procedure: FASCIOTOMY;  Surgeon: [name], MD;  Location: [place];  Service: Orthopedics;  Laterality: Right;  FASCIOTOMY RIGHT SMALL FINGER  . FASCIOTOMY Left 08/17/2013   Procedure: FASCIOTOMY LEFT RING;  Surgeon: [name], MD;  Location: [place];  Service: Orthopedics;  Laterality: Left;  . INCISION AND DRAINAGE ABSCESS Left 10/15/2015   Procedure: INCISION AND DRAINAGE ABSCESS;  Surgeon: [name], MD;  Location: [facility];  Service: General;  Laterality: Left;  . KIDNEY TRANSPLANT  09/13/2010   cadaver--at [facility]  . RIGHT HEART CATH N/A 11/15/2016  Procedure: RIGHT HEART CATH;  Surgeon: [name], MD;  Location: [place] [facility];  Service: Cardiovascular;  Laterality: N/A;  . TYMPANIC MEMBRANE REPAIR  1/12   left  . VASECTOMY     Family History  Problem Relation Age of Onset  . Heart disease Father   . Kidney failure Father   . Kidney disease Father   . Diabetes Maternal Grandmother   . Breast cancer Maternal Grandmother   . Valvular heart disease Mother   . Liver cancer Paternal Uncle   . Liver cancer Paternal Grandmother   . Prostate cancer Neg Hx    Social History   Tobacco Use  . Smoking status: Former Smoker    Types: Cigars    Last  attempt to quit: 09/02/1994    Years since quitting: 22.7  . Smokeless tobacco: Never Used  Substance Use Topics  . Alcohol use: Yes    Alcohol/week: 0.0 - 0.6 oz    Comment: occasional- 3 in the past year   Allergies  Allergen Reactions  . Morphine Itching   Prior to Admission medications   Medication Sig Start Date End Date Taking? Authorizing Provider  albuterol (PROVENTIL HFA;VENTOLIN HFA) 108 (90 Base) MCG/ACT inhaler Inhale 2 puffs every 6 (six) hours as needed into the lungs for wheezing or shortness of breath. 02/10/17  Yes [name] I, MD  amLODipine (NORVASC) 5 MG tablet TAKE 1 TABLET BY MOUTH DAILY 06/21/16  Yes [name], MD  apixaban (ELIQUIS) 5 MG TABS tablet Take 1 tablet (5 mg total) by mouth 2 (two) times daily. 04/08/17  Yes [name], MD  beclomethasone (QVAR) 80 MCG/ACT inhaler Inhale 2 puffs into the lungs 2 (two) times daily. 05/10/16  Yes [name], MD  buPROPion (WELLBUTRIN) 75 MG tablet TAKE 1 TABLET BY MOUTH TWICE DAILY 04/14/17  Yes [name], MD  chlorpheniramine-HYDROcodone [facility] PENNKINETIC ER) 10-8 MG/5ML SUER Take 5 mLs by mouth every 12 (twelve) hours as needed for cough. 03/31/17  Yes [name], PA-C  furosemide (LASIX) 40 MG tablet Take 1 tablet (40 mg total) by mouth 2 (two) times daily. Take an additional 39m in the PM as needed for weight gain. 01/20/17 05/14/17 Yes Jeanni Allshouse, [name], FNP  insulin glargine (LANTUS) 100 unit/mL SOPN Inject 0.1 mLs (10 Units total) into the skin at bedtime. Currently using 15 units Patient taking differently: Inject 10 Units into the skin at bedtime. Currently using 10 units 11/16/16  Yes [name] MD  lisinopril (PRINIVIL,ZESTRIL) 40 MG tablet TAKE 1 TABLET BY MOUTH DAILY 04/07/17  Yes AWellington Hampshire MD  metolazone (ZAROXOLYN) 5 MG tablet TAKE 1 TABLET BY MOUTH AS NEEDED 03/13/17  Yes HDarylene PriceA, FNP  mometasone (NASONEX) 50 MCG/ACT nasal spray PLACE 2 SPRAYS INTO THE  NOSE AT BEDTIME. 11/26/16  Yes LVenia Carbon MD  Multiple Vitamin (MULTIVITAMIN WITH MINERALS) TABS tablet Take 1 tablet by mouth daily.   Yes [provider]  mycophenolate (MYFORTIC) 180 MG EC tablet Take 360 mg by mouth 2 (two) times daily.    Yes [provider]  omeprazole (PRILOSEC) 20 MG capsule TAKE 1 CAPSULE BY MOUTH ONCE DAILY 02/21/16  Yes LVenia Carbon MD  potassium chloride (MICRO-K) 10 MEQ CR capsule Take 2 capsules (20 mEq total) by mouth 2 (two) times daily. 05/02/17  Yes HDarylene PriceA, FNP  rosuvastatin (CRESTOR) 10 MG tablet Take 10 mg by mouth daily.   Yes [provider]  sildenafil (VIAGRA) 100 MG tablet Take 0.5-1 tablets (50-100 mg total) by mouth daily as needed for erectile dysfunction. 01/03/17  Yes [name], MD  spironolactone (ALDACTONE) 25 MG tablet Take 1 tablet (25 mg total) by mouth daily. 11/16/16 11/16/17 Yes Fritzi Mandes, MD  sulfamethoxazole-trimethoprim (BACTRIM,SEPTRA) 400-80 MG tablet Take 1 tablet by mouth 3 (three) times a week.   Yes [provider]  tacrolimus (PROGRAF) 0.5 MG capsule Take 1 mg by mouth 2 (two) times daily.   Yes [provider]    Review of Systems  Constitutional: Positive for fatigue. Negative for appetite change and fever.  HENT: Positive for congestion. Negative for postnasal drip and sore throat.   Eyes: Negative.   Respiratory: Positive for cough (productive) and shortness of breath (at times when coughing). Negative for chest tightness.   Cardiovascular: Positive for leg swelling (minimal). Negative for chest pain and palpitations.  Gastrointestinal: Positive for abdominal distention ("better"). Negative for abdominal pain.  Endocrine: Negative.   Genitourinary: Negative.   Musculoskeletal: Negative for back pain and neck pain.  Skin: Negative.   Allergic/Immunologic: Negative.   Neurological: Positive for light-headedness (when coughing). Negative for dizziness.   Hematological: Negative for adenopathy. Does not bruise/bleed easily.  Psychiatric/Behavioral: Negative for dysphoric mood and sleep disturbance. The patient is not nervous/anxious.    Vitals:   05/14/17 0823  BP: 139/76  Pulse: 68  Resp: 18  SpO2: 99%  Weight: 268 lb 2 oz (121.6 kg)  Height: _0  (1.803 m)   Wt Readings from Last 3 Encounters:  05/14/17 268 lb 2 oz (121.6 kg)  04/08/17 274 lb 8 oz (124.5 kg)  03/31/17 281 lb (127.5 kg)   Lab Results  Component Value Date   CREATININE 1.12 03/31/2017   CREATININE 1.10 02/05/2017   CREATININE 1.21 01/29/2017    Physical Exam  Constitutional: He is oriented to person, place, and time. He appears well-developed and well-nourished.  HENT:  Head: Normocephalic and atraumatic.  Neck: Normal range of motion. Neck supple. No JVD present.  Cardiovascular: Normal rate. An irregular rhythm present.  Pulmonary/Chest: Effort normal. He has no wheezes. He has no rales.  Abdominal: Soft. He exhibits distension. There is no tenderness.  Musculoskeletal: He exhibits edema (trace in  bilateral lower legs). He exhibits no tenderness.  Neurological: He is alert and oriented to person, place, and time.  Skin: Skin is warm and dry.  Psychiatric: He has a normal mood and affect. His behavior is normal. Thought content normal.  Nursing note and vitals reviewed.   Assessment & Plan:  1: Chronic heart failure with preserved ejection fraction- - NYHA class II - mildly fluid overloaded today but stable - continues to weigh daily and notes a gradual weight loss. Reminded to call for an overnight weight gain of >2 pounds or a weekly weight gain of >5 pounds; he feels like his dry weight is close to 256 pounds - weight down 6 pounds since he was last here 04/08/17 - not adding salt and has been reading food labels. Discussed the importance of closely following a 2029m sodium diet - has metolazone that he can take PRN. Says that he last took it  last week - hasn't been going to the gym as much but is trying to be active - is on spironolactone and potassium; has a history of hypokalemia - BMP from 03/31/17 reviewed and shows sodium 138, potassium 4.1 and GFR >60 - saw cardiologist (Fletcher Anon 04/08/17 -  has tried torsemide in the past without much relief  2: HTN- - BP looks good today - saw PCP Silvio Pate) 02/10/17 - follows with nephrology Candiss Norse) and has an upcoming appointment with him; also continues to follow [facility] due to past kidney transplant  3: Atrial fibrillation- - currently rate controlled at this time - on amlodipine, apixaban - not a cardioversion candidate at this time   Patient did not bring his medications nor a list. Each medication was verbally reviewed with the patient and he was encouraged to bring the bottles to every visit to confirm accuracy of list.  Return in 3 months or sooner for any questions/problems before then.

## 2017-05-14 ENCOUNTER — Encounter: Payer: Self-pay | Admitting: Family

## 2017-05-14 ENCOUNTER — Ambulatory Visit: Payer: 59 | Attending: Family | Admitting: Family

## 2017-05-14 VITALS — BP 139/76 | HR 68 | Resp 18 | Ht 71.0 in | Wt 268.1 lb

## 2017-05-14 DIAGNOSIS — I5032 Chronic diastolic (congestive) heart failure: Secondary | ICD-10-CM | POA: Insufficient documentation

## 2017-05-14 DIAGNOSIS — F329 Major depressive disorder, single episode, unspecified: Secondary | ICD-10-CM | POA: Diagnosis not present

## 2017-05-14 DIAGNOSIS — N186 End stage renal disease: Secondary | ICD-10-CM | POA: Insufficient documentation

## 2017-05-14 DIAGNOSIS — I132 Hypertensive heart and chronic kidney disease with heart failure and with stage 5 chronic kidney disease, or end stage renal disease: Secondary | ICD-10-CM | POA: Diagnosis not present

## 2017-05-14 DIAGNOSIS — G3184 Mild cognitive impairment, so stated: Secondary | ICD-10-CM | POA: Insufficient documentation

## 2017-05-14 DIAGNOSIS — E1122 Type 2 diabetes mellitus with diabetic chronic kidney disease: Secondary | ICD-10-CM | POA: Diagnosis not present

## 2017-05-14 DIAGNOSIS — I10 Essential (primary) hypertension: Secondary | ICD-10-CM

## 2017-05-14 DIAGNOSIS — Z951 Presence of aortocoronary bypass graft: Secondary | ICD-10-CM | POA: Insufficient documentation

## 2017-05-14 DIAGNOSIS — Z79899 Other long term (current) drug therapy: Secondary | ICD-10-CM | POA: Diagnosis not present

## 2017-05-14 DIAGNOSIS — Z885 Allergy status to narcotic agent status: Secondary | ICD-10-CM | POA: Insufficient documentation

## 2017-05-14 DIAGNOSIS — Z940 Kidney transplant status: Secondary | ICD-10-CM | POA: Diagnosis not present

## 2017-05-14 DIAGNOSIS — E785 Hyperlipidemia, unspecified: Secondary | ICD-10-CM | POA: Diagnosis not present

## 2017-05-14 DIAGNOSIS — I872 Venous insufficiency (chronic) (peripheral): Secondary | ICD-10-CM | POA: Insufficient documentation

## 2017-05-14 DIAGNOSIS — Z794 Long term (current) use of insulin: Secondary | ICD-10-CM | POA: Diagnosis not present

## 2017-05-14 DIAGNOSIS — I4891 Unspecified atrial fibrillation: Secondary | ICD-10-CM | POA: Diagnosis not present

## 2017-05-14 DIAGNOSIS — I2510 Atherosclerotic heart disease of native coronary artery without angina pectoris: Secondary | ICD-10-CM | POA: Diagnosis not present

## 2017-05-14 DIAGNOSIS — J45909 Unspecified asthma, uncomplicated: Secondary | ICD-10-CM | POA: Insufficient documentation

## 2017-05-14 DIAGNOSIS — Z87891 Personal history of nicotine dependence: Secondary | ICD-10-CM | POA: Diagnosis not present

## 2017-05-14 DIAGNOSIS — I482 Chronic atrial fibrillation, unspecified: Secondary | ICD-10-CM

## 2017-05-14 NOTE — Patient Instructions (Signed)
Continue weighing daily and call for an overnight weight gain of > 2 pounds or a weekly weight gain of >5 pounds. 

## 2017-05-21 ENCOUNTER — Encounter: Payer: Self-pay | Admitting: Internal Medicine

## 2017-05-21 ENCOUNTER — Ambulatory Visit (INDEPENDENT_AMBULATORY_CARE_PROVIDER_SITE_OTHER)
Admission: RE | Admit: 2017-05-21 | Discharge: 2017-05-21 | Disposition: A | Discharge status: Home / Self Care | Payer: 59 | Source: Ambulatory Visit | Attending: Internal Medicine | Admitting: Internal Medicine

## 2017-05-21 ENCOUNTER — Ambulatory Visit (INDEPENDENT_AMBULATORY_CARE_PROVIDER_SITE_OTHER): Payer: 59 | Admitting: Internal Medicine

## 2017-05-21 VITALS — BP 138/76 | HR 70 | Temp 97.8°F | Wt 272.0 lb

## 2017-05-21 DIAGNOSIS — I2510 Atherosclerotic heart disease of native coronary artery without angina pectoris: Secondary | ICD-10-CM | POA: Diagnosis not present

## 2017-05-21 DIAGNOSIS — J4541 Moderate persistent asthma with (acute) exacerbation: Secondary | ICD-10-CM | POA: Diagnosis not present

## 2017-05-21 DIAGNOSIS — J4540 Moderate persistent asthma, uncomplicated: Secondary | ICD-10-CM | POA: Insufficient documentation

## 2017-05-21 DIAGNOSIS — R05 Cough: Secondary | ICD-10-CM | POA: Diagnosis not present

## 2017-05-21 MED ORDER — IPRATROPIUM-ALBUTEROL 0.5-2.5 (3) MG/3ML IN SOLN
3.0000 mL | Freq: Once | RESPIRATORY_TRACT | Status: AC
  Administered 2017-05-21: 3 mL via RESPIRATORY_TRACT

## 2017-05-21 MED ORDER — PREDNISONE 20 MG PO TABS: 40.0000 mg | ORAL_TABLET | Freq: Every day | ORAL | 0 refills | Status: DC

## 2017-05-21 NOTE — Progress Notes (Signed)
Subjective:    Patient ID: [name], male    DOB: 1960/06/18, 57 y.o.   MRN: [id number]  HPI Here due to ongoing cough Treated by me about 3 months ago---improved Then recurred last month Hasn't been better since then Even had cough syncope yesterday  Ongoing SOB Some wheezing at times O2 went down to 84 at home  Q-var bid Using albuterol --but not often (unclear if it helps)  Low grade fever-- 100.9 three days ago Clear to yellow to dark sputum Using the cough medication--helps at times (especially for sleep)  Current Outpatient Medications on File Prior to Visit  Medication Sig Dispense Refill  . albuterol (PROVENTIL HFA;VENTOLIN HFA) 108 (90 Base) MCG/ACT inhaler Inhale 2 puffs every 6 (six) hours as needed into the lungs for wheezing or shortness of breath. 1 Inhaler 2  . amLODipine (NORVASC) 5 MG tablet TAKE 1 TABLET BY MOUTH DAILY 90 tablet 3  . apixaban (ELIQUIS) 5 MG TABS tablet Take 1 tablet (5 mg total) by mouth 2 (two) times daily. 180 tablet 3  . beclomethasone (QVAR) 80 MCG/ACT inhaler Inhale 2 puffs into the lungs 2 (two) times daily. 1 Inhaler 6  . buPROPion (WELLBUTRIN) 75 MG tablet TAKE 1 TABLET BY MOUTH TWICE DAILY 180 tablet 3  . insulin glargine (LANTUS) 100 unit/mL SOPN Inject 0.1 mLs (10 Units total) into the skin at bedtime. Currently using 15 units (Patient taking differently: Inject 10 Units into the skin at bedtime. Currently using 10 units) 15 mL 11  . lisinopril (PRINIVIL,ZESTRIL) 40 MG tablet TAKE 1 TABLET BY MOUTH DAILY 30 tablet 3  . metolazone (ZAROXOLYN) 5 MG tablet TAKE 1 TABLET BY MOUTH AS NEEDED 15 tablet 0  . mometasone (NASONEX) 50 MCG/ACT nasal spray PLACE 2 SPRAYS INTO THE NOSE AT BEDTIME. 17 g 11  . Multiple Vitamin (MULTIVITAMIN WITH MINERALS) TABS tablet Take 1 tablet by mouth daily.    . mycophenolate (MYFORTIC) 180 MG EC tablet Take 360 mg by mouth 2 (two) times daily.     Marland Kitchen omeprazole (PRILOSEC) 20 MG capsule TAKE 1 CAPSULE BY  MOUTH ONCE DAILY 90 capsule 3  . potassium chloride (MICRO-K) 10 MEQ CR capsule Take 2 capsules (20 mEq total) by mouth 2 (two) times daily. 360 capsule 4  . rosuvastatin (CRESTOR) 10 MG tablet Take 10 mg by mouth daily.    . sildenafil (VIAGRA) 100 MG tablet Take 0.5-1 tablets (50-100 mg total) by mouth daily as needed for erectile dysfunction. 10 tablet 11  . spironolactone (ALDACTONE) 25 MG tablet Take 1 tablet (25 mg total) by mouth daily. 30 tablet 11  . tacrolimus (PROGRAF) 0.5 MG capsule Take 1 mg by mouth 2 (two) times daily.    . furosemide (LASIX) 40 MG tablet Take 1 tablet (40 mg total) by mouth 2 (two) times daily. Take an additional 64m in the PM as needed for weight gain. 100 tablet 3   No current facility-administered medications on file prior to visit.     Allergies  Allergen Reactions  . Morphine Itching    Past Medical History:  Diagnosis Date  . Allergy    seasonal  . Amnestic MCI (mild cognitive impairment with memory loss)   . Asthma   . Atrial fibrillation ([facility]   . Atrial fibrillation ([facility] 2016  . Benign neoplasm of colon   . CAD (coronary artery disease) 2005   had CABG  . CHF (congestive heart failure) ([facility]   . Chronic venous stasis  dermatitis of both lower extremities   . Cytomegaloviral disease ([place]) 2017  . Depression   . Diabetes mellitus   . Diverticulosis   . Dyspnea   . Erectile dysfunction   . ESRD (end stage renal disease) ([name]) 2005   Dialysis then renal transplant  . GERD (gastroesophageal reflux disease)   . Gout   . Hyperlipidemia    low since renal failure  . Hypertension   . Kidney transplant status, cadaveric 2012  . Obesity   . Pneumonia   . Purpura ([place])   . Sleep apnea    bipap with oxygen 2 l  . Trigger finger   . Ulcer 05/2016   Left shin  . Wears glasses     Past Surgical History:  Procedure Laterality Date  . BARIATRIC SURGERY    . CARDIAC CATHETERIZATION     [facility]  . COLONOSCOPY WITH PROPOFOL N/A 04/22/2013     Procedure: COLONOSCOPY WITH PROPOFOL;  Surgeon: [name], MD;  [facility];  Service: Endoscopy;  Laterality: N/A;  . CORONARY ARTERY BYPASS GRAFT  2005   X 4  . DG ANGIO AV SHUNT*L*     right and left upper arms  . FASCIOTOMY  03/03/2012   Procedure: FASCIOTOMY;  Surgeon: [name], MD;  Location: [place];  Service: Orthopedics;  Laterality: Right;  FASCIOTOMY RIGHT SMALL FINGER  . FASCIOTOMY Left 08/17/2013   Procedure: FASCIOTOMY LEFT RING;  Surgeon: [name], MD;  Location: [place];  Service: Orthopedics;  Laterality: Left;  . INCISION AND DRAINAGE ABSCESS Left 10/15/2015   Procedure: INCISION AND DRAINAGE ABSCESS;  Surgeon: [name], MD;  Location: [facility];  Service: General;  Laterality: Left;  . KIDNEY TRANSPLANT  09/13/2010   cadaver--at [facility]  . RIGHT HEART CATH N/A 11/15/2016   Procedure: RIGHT HEART CATH;  Surgeon: [name], MD;  Location: [place] [facility];  Service: Cardiovascular;  Laterality: N/A;  . TYMPANIC MEMBRANE REPAIR  1/12   left  . VASECTOMY      Family History  Problem Relation Age of Onset  . Heart disease Father   . Kidney failure Father   . Kidney disease Father   . Diabetes Maternal Grandmother   . Breast cancer Maternal Grandmother   . Valvular heart disease Mother   . Liver cancer Paternal Uncle   . Liver cancer Paternal Grandmother   . Prostate cancer Neg Hx     Social History   Socioeconomic History  . Marital status: Married    Spouse name: Not on file  . Number of children: 2  . Years of education: Not on file  . Highest education level: Not on file  Social Needs  . Financial resource strain: Not on file  . Food insecurity - worry: Not on file  . Food insecurity - inability: Not on file  . Transportation needs - medical: Not on file  . Transportation needs - non-medical: Not on file  Occupational History  . Occupation: Academic librarian:  unemployed    Comment: disabled due to kidney failure  Tobacco Use  . Smoking status: Former Smoker    Types: Cigars    Last attempt to quit: 09/02/1994    Years since quitting: 22.7  . Smokeless tobacco: Never Used  Substance and Sexual Activity  . Alcohol use: Yes    Alcohol/week: 0.0 - 0.6 oz    Comment: occasional- 3 in the  past year  . Drug use: No  . Sexual activity: Not on file  Other Topics Concern  . Not on file  Social History Narrative   In school for culinary arts--- will finish 12/16      Has living will   Wife is health care POA   Would accept resuscitation attempts   Hasn't considered tube feedings   Review of Systems  CHF seems to be controlled Appetite is off Sleeping in recliner     Objective:   Physical Exam  Constitutional:  Frequent coarse cough--some sputum  HENT:  Mouth/Throat: Oropharynx is clear and moist. No oropharyngeal exudate.  Neck: No thyromegaly present.  Cardiovascular: Normal rate, regular rhythm and normal heart sounds. Exam reveals no gallop.  No murmur heard. Pulmonary/Chest:  Cough paroxysms with deep breath Prolonged expiratory phase with rhonchi  Lymphadenopathy:    He has no cervical adenopathy.          Assessment & Plan:

## 2017-05-21 NOTE — Assessment & Plan Note (Addendum)
Will try duoneb treatment and check CXR Will need pulmonary reevaluation  Much better after duoneb--not coughing so much Air movement much better on exam and fairly normal expiratory phase (slight rhonchi) CXR doesn't seem to show any pneumonia, just chronic vascular congestion  I don't think this is infectious and his CHF seems controlled mostly Discussed using the albuterol inhaler more with spacer Prednisone burst No antibiotic Will try to get him back in with Dr [place] Bells as soon as possible

## 2017-05-21 NOTE — Patient Instructions (Signed)
Please call Dr Luane School office for a follow up appointment

## 2017-05-25 DIAGNOSIS — E662 Morbid (severe) obesity with alveolar hypoventilation: Secondary | ICD-10-CM | POA: Diagnosis not present

## 2017-05-25 DIAGNOSIS — I482 Chronic atrial fibrillation: Secondary | ICD-10-CM | POA: Diagnosis not present

## 2017-05-25 DIAGNOSIS — G4733 Obstructive sleep apnea (adult) (pediatric): Secondary | ICD-10-CM | POA: Diagnosis not present

## 2017-05-26 ENCOUNTER — Encounter: Payer: Self-pay | Admitting: Internal Medicine

## 2017-05-26 ENCOUNTER — Ambulatory Visit (INDEPENDENT_AMBULATORY_CARE_PROVIDER_SITE_OTHER): Payer: 59 | Admitting: Internal Medicine

## 2017-05-26 VITALS — BP 158/92 | HR 73 | Ht 71.0 in | Wt 260.0 lb

## 2017-05-26 DIAGNOSIS — I2510 Atherosclerotic heart disease of native coronary artery without angina pectoris: Secondary | ICD-10-CM

## 2017-05-26 DIAGNOSIS — J45998 Other asthma: Secondary | ICD-10-CM | POA: Diagnosis not present

## 2017-05-26 DIAGNOSIS — G4733 Obstructive sleep apnea (adult) (pediatric): Secondary | ICD-10-CM

## 2017-05-26 MED ORDER — BECLOMETHASONE DIPROPIONATE 80 MCG/ACT IN AERS: 2.0000 | INHALATION_SPRAY | Freq: Two times a day (BID) | RESPIRATORY_TRACT | 6 refills | Status: DC

## 2017-05-26 MED ORDER — MONTELUKAST SODIUM 10 MG PO TABS: 10.0000 mg | ORAL_TABLET | Freq: Every day | ORAL | 5 refills | Status: DC

## 2017-05-26 NOTE — Progress Notes (Signed)
[facility]     Assessment and Plan:  Acute asthma exacerbation. -Patient had quite a severe asthma exacerbation, currently improved on the current prednisone taper. -He has not been taking Qvar regularly, discussed the importance of taking Qvar 2 puffs twice daily and rinsing mouth after use to prevent future asthma exacerbations and to prevent this current exacerbation from recurring. -Discussed the importance of taking a rescue inhaler as needed.  We also reviewed his inhaler technique today.  Allergic rhinitis. -Persistent nasal allergies particular in the spring and summertime.  We discussed that nasal drainage can contribute to asthma flareups, therefore it is important to treat this going forwards. -I have started him on Singulair nightly.  Obstructive sleep apnea. -Doing very well on BiPAP 14/9, encouraged to continue.  Meds ordered this encounter  Medications  . beclomethasone (QVAR) 80 MCG/ACT inhaler    Sig: Inhale 2 puffs into the lungs 2 (two) times daily.    Dispense:  1 Inhaler    Refill:  6  . montelukast (SINGULAIR) 10 MG tablet    Sig: Take 1 tablet (10 mg total) by mouth at bedtime.    Dispense:  30 tablet    Refill:  5   Return in about 3 months (around 08/26/2017).    Date: 05/26/2017  MRN# [id number] [name] 09-17-1960    [name] is a 57 y.o. old male seen in consultation for chief complaint of:    Chief Complaint  Patient presents with  . Follow-up    OSA and asthma: doing well on CPAP: passed out week ago at from coughing: wheezing and chest tightness    HPI:   Synopsis: Mr. Sterkel was previously followed by Dr. Halford Chessman for asthma and obesity hypoventilation syndrome with OSA.  History of renal transplant.  She has been maintained on BiPAP for OHS, and Qvar for asthma.He feels that he is breathing better, he has been having constant symptoms since November, then after getting a course of prednisone and got a breathing  treatment and notices a day and night difference. He is in the middle of a 10 day course.  He has qvar 80, he does not really use regularly. He is using the albuterol twice daily.   He is not a smoker.  He denies reflux he does have sinus drainage is on nasonex.  He has never been tested for allergies, he notes that spring and fall come out he has a lot of nasal and eye congestion.   He feels that he is doing well with his cpap, he is much more awake since starting it. He is on a full face mask.   Review of download data, 30 days as of 05/22/17.  Average usage on days used is 7 hours 9 minutes, uses greater than 4 hours is 30/30 days.  Pressure setting is BiPAP 14/9.  Residual AHI is 2  PSG 03/15/12 >> AHI 57.9, low SpO2 78%. CPAP 12 cm H2O  CT chest [facility]) 09/24/12 >> central bronchial thickening, LUL pleural scarring  V/Q scan [facility]) 09/24/12 >> matched defect Lt upper lobe  ABG [facility]) 09/25/12 >> pH 7.40, PaCO2 68.2, PaO2 61.5  Echo bubble study [facility]) 09/26/12 >> poor study, no identified shunt  Room air SpO2 10/27/12 >> 87%, start 2 liters oxygen 24/7  ONO with CPAP and RA 11/02/12 >> Test time 1 hr 22 min. Mean SpO2 76%, low SpO2 59%. Spent 1 hr 16 min with SpO2 < 88%.  CPAP 10/15/12  to 11/13/12 >> used on 30 of 30 nights with average 4 hrs 22 min. Average AHI 3.8 with CPAP 12 cm H2O.  PFT 11/27/12 >> FEV1 1.74, FEV1% 80%, TLC 4.16 (60%), DLCO 81%, +BD  CPAP 11/16/12 to 12/15/12 >> Used on 30 of 30 nights with average 3 hrs 56 min. Average AHI 3.7 with CPAP 12 cm H2O.  ONO with CPAP and 2 liters 11/27/12 >> Test time 4 hrs 39 min. Basal SpO2 88%, low SpO2 67%. Spent 117 min with SpO2 < 88%  Echo 04/27/13 >> EF 35 to 40%, mod LVH, grade 2 diastolic dysfunction, severe LA dilation, mod RA dilation  ABG on room air 05/12/13 >> pH 7.41, PaCO2 70, PaO2 < 43  BiPAP 06/21/13 >> 14/9 cm H2O with 2 liters oxygen >> AHI 0.   Medication:    Current Outpatient Medications:  .  albuterol  (PROVENTIL HFA;VENTOLIN HFA) 108 (90 Base) MCG/ACT inhaler, Inhale 2 puffs every 6 (six) hours as needed into the lungs for wheezing or shortness of breath., Disp: 1 Inhaler, Rfl: 2 .  amLODipine (NORVASC) 5 MG tablet, TAKE 1 TABLET BY MOUTH DAILY, Disp: 90 tablet, Rfl: 3 .  apixaban (ELIQUIS) 5 MG TABS tablet, Take 1 tablet (5 mg total) by mouth 2 (two) times daily., Disp: 180 tablet, Rfl: 3 .  beclomethasone (QVAR) 80 MCG/ACT inhaler, Inhale 2 puffs into the lungs 2 (two) times daily., Disp: 1 Inhaler, Rfl: 6 .  buPROPion (WELLBUTRIN) 75 MG tablet, TAKE 1 TABLET BY MOUTH TWICE DAILY, Disp: 180 tablet, Rfl: 3 .  insulin glargine (LANTUS) 100 unit/mL SOPN, Inject 0.1 mLs (10 Units total) into the skin at bedtime. Currently using 15 units (Patient taking differently: Inject 10 Units into the skin at bedtime. Currently using 10 units), Disp: 15 mL, Rfl: 11 .  lisinopril (PRINIVIL,ZESTRIL) 40 MG tablet, TAKE 1 TABLET BY MOUTH DAILY, Disp: 30 tablet, Rfl: 3 .  metolazone (ZAROXOLYN) 5 MG tablet, TAKE 1 TABLET BY MOUTH AS NEEDED, Disp: 15 tablet, Rfl: 0 .  mometasone (NASONEX) 50 MCG/ACT nasal spray, PLACE 2 SPRAYS INTO THE NOSE AT BEDTIME., Disp: 17 g, Rfl: 11 .  Multiple Vitamin (MULTIVITAMIN WITH MINERALS) TABS tablet, Take 1 tablet by mouth daily., Disp: , Rfl:  .  mycophenolate (MYFORTIC) 180 MG EC tablet, Take 360 mg by mouth 2 (two) times daily. , Disp: , Rfl:  .  omeprazole (PRILOSEC) 20 MG capsule, TAKE 1 CAPSULE BY MOUTH ONCE DAILY, Disp: 90 capsule, Rfl: 3 .  potassium chloride (MICRO-K) 10 MEQ CR capsule, Take 2 capsules (20 mEq total) by mouth 2 (two) times daily., Disp: 360 capsule, Rfl: 4 .  predniSONE (DELTASONE) 20 MG tablet, Take 2 tablets (40 mg total) by mouth daily. For 5 days, then 1 daily for 5 days, Disp: 15 tablet, Rfl: 0 .  rosuvastatin (CRESTOR) 10 MG tablet, Take 10 mg by mouth daily., Disp: , Rfl:  .  sildenafil (VIAGRA) 100 MG tablet, Take 0.5-1 tablets (50-100 mg total) by  mouth daily as needed for erectile dysfunction., Disp: 10 tablet, Rfl: 11 .  spironolactone (ALDACTONE) 25 MG tablet, Take 1 tablet (25 mg total) by mouth daily., Disp: 30 tablet, Rfl: 11 .  tacrolimus (PROGRAF) 0.5 MG capsule, Take 1 mg by mouth 2 (two) times daily., Disp: , Rfl:  .  furosemide (LASIX) 40 MG tablet, Take 1 tablet (40 mg total) by mouth 2 (two) times daily. Take an additional 56m in the PM as needed for weight  gain., Disp: 100 tablet, Rfl: 3   Allergies:  Morphine  Review of Systems: Gen:  Denies  fever, sweats, chills HEENT: Denies blurred vision, double vision. bleeds, sore throat Cvc:  No dizziness, chest pain. Resp:   Denies cough or sputum production, shortness of breath Gi: Denies swallowing difficulty, stomach pain. Gu:  Denies bladder incontinence, burning urine Ext:   No Joint pain, stiffness. Skin: No skin rash,  hives  Endoc:  No polyuria, polydipsia. Psych: No depression, insomnia. Other:  All other systems were reviewed with the patient and were negative other that what is mentioned in the HPI.   Physical Examination:   VS: BP (!) 158/92 (BP Location: Right Arm, Cuff Size: Normal)   Pulse 73   Ht 5' 11" (1.803 m)   Wt 260 lb (117.9 kg)   SpO2 92%   BMI 36.26 kg/m   General Appearance: No distress  Neuro:without focal findings,  speech normal,  HEENT: PERRLA, EOM intact.   Pulmonary: normal breath sounds, No wheezing.  CardiovascularNormal S1,S2.  No m/r/g.   Abdomen: Benign, Soft, non-tender. Renal:  No costovertebral tenderness  GU:  No performed at this time. Endoc: No evident thyromegaly, no signs of acromegaly. Skin:   warm, no rashes, no ecchymosis  Extremities: normal, no cyanosis, clubbing.  Other findings:    LABORATORY PANEL:   CBC No results for input(s): WBC, HGB, HCT, PLT in the last 168 hours. ------------------------------------------------------------------------------------------------------------------  Chemistries    No results for input(s): NA, K, CL, CO2, GLUCOSE, BUN, CREATININE, CALCIUM, MG, AST, ALT, ALKPHOS, BILITOT in the last 168 hours.  Invalid input(s): GFRCGP ------------------------------------------------------------------------------------------------------------------  Cardiac Enzymes No results for input(s): TROPONINI in the last 168 hours. ------------------------------------------------------------  RADIOLOGY:  No results found.     Thank  you for the consultation and for allowing [facility] to assist in the care of your patient. Our recommendations are noted above.  Please contact us if we can be of further service.   Marda Stalker, MD.  Board Certified in Internal Medicine, Pulmonary Medicine, [place], and Sleep Medicine.  [facility] Office Number: [phone]  Patricia Pesa, M.D.  Merton Border, M.D  05/26/2017

## 2017-05-26 NOTE — Patient Instructions (Addendum)
Go back to using Qvar 2 puffs twice per day, make sure to rinse mouth after use.  Use albuterol 2 puffs as needed.  Will start singulair tablet every night.

## 2017-06-19 DIAGNOSIS — E1129 Type 2 diabetes mellitus with other diabetic kidney complication: Secondary | ICD-10-CM | POA: Diagnosis not present

## 2017-06-19 DIAGNOSIS — Z940 Kidney transplant status: Secondary | ICD-10-CM | POA: Diagnosis not present

## 2017-06-19 DIAGNOSIS — R601 Generalized edema: Secondary | ICD-10-CM | POA: Diagnosis not present

## 2017-06-19 DIAGNOSIS — E876 Hypokalemia: Secondary | ICD-10-CM | POA: Diagnosis not present

## 2017-06-19 DIAGNOSIS — R600 Localized edema: Secondary | ICD-10-CM | POA: Diagnosis not present

## 2017-06-20 DIAGNOSIS — E876 Hypokalemia: Secondary | ICD-10-CM | POA: Diagnosis not present

## 2017-06-20 DIAGNOSIS — Z940 Kidney transplant status: Secondary | ICD-10-CM | POA: Diagnosis not present

## 2017-06-20 DIAGNOSIS — R600 Localized edema: Secondary | ICD-10-CM | POA: Diagnosis not present

## 2017-06-20 DIAGNOSIS — E1129 Type 2 diabetes mellitus with other diabetic kidney complication: Secondary | ICD-10-CM | POA: Diagnosis not present

## 2017-06-20 LAB — BASIC METABOLIC PANEL: CREATININE: 1.2 (ref 0.6–1.3)

## 2017-06-20 LAB — CBC AND DIFFERENTIAL
HEMATOCRIT: 35 — AB (ref 41–53)
Hemoglobin: 11.1 — AB (ref 13.5–17.5)

## 2017-06-20 LAB — HEMOGLOBIN A1C: HEMOGLOBIN A1C: 7.5

## 2017-06-25 DIAGNOSIS — E662 Morbid (severe) obesity with alveolar hypoventilation: Secondary | ICD-10-CM | POA: Diagnosis not present

## 2017-06-25 DIAGNOSIS — G4733 Obstructive sleep apnea (adult) (pediatric): Secondary | ICD-10-CM | POA: Diagnosis not present

## 2017-06-25 DIAGNOSIS — I482 Chronic atrial fibrillation: Secondary | ICD-10-CM | POA: Diagnosis not present

## 2017-07-10 ENCOUNTER — Encounter: Payer: Self-pay | Admitting: Internal Medicine

## 2017-07-15 ENCOUNTER — Other Ambulatory Visit: Payer: Self-pay | Admitting: Family

## 2017-07-18 ENCOUNTER — Telehealth: Payer: Self-pay | Admitting: Cardiovascular Disease

## 2017-07-18 NOTE — Telephone Encounter (Signed)
The patient dropped off an applications for "renewal of disability parking placard."  Placed in Dr. Tyrell Antonio box to review.

## 2017-07-18 NOTE — Telephone Encounter (Signed)
Pt wife dropped application for Renewal of disability paperwork   Placed in nurses bin for completion

## 2017-07-25 DIAGNOSIS — G4733 Obstructive sleep apnea (adult) (pediatric): Secondary | ICD-10-CM | POA: Diagnosis not present

## 2017-07-25 DIAGNOSIS — I482 Chronic atrial fibrillation: Secondary | ICD-10-CM | POA: Diagnosis not present

## 2017-07-25 DIAGNOSIS — E662 Morbid (severe) obesity with alveolar hypoventilation: Secondary | ICD-10-CM | POA: Diagnosis not present

## 2017-07-29 NOTE — Telephone Encounter (Signed)
Patients wife came into office to pick up signed DMV Placard.

## 2017-08-08 DIAGNOSIS — H5213 Myopia, bilateral: Secondary | ICD-10-CM | POA: Diagnosis not present

## 2017-08-08 DIAGNOSIS — E113553 Type 2 diabetes mellitus with stable proliferative diabetic retinopathy, bilateral: Secondary | ICD-10-CM | POA: Diagnosis not present

## 2017-08-08 LAB — HM DIABETES EYE EXAM

## 2017-08-10 NOTE — Progress Notes (Deleted)
Patient ID: [name], male    DOB: 10-29-1960, 57 y.o.   MRN: [id number]  HPI  [name] is a 57 y/o with a history of asthma, atrial fibrillation, CAD (CABG), chronic venous stastis of lower legs, depression, DM, renal disease (s/p renal transplant), GERD, HTN, hyperlipidemia, obstructive sleep apnea (with oxygen), remote tobacco use and chronic heart failure.   Echo done 10/16/16 showed an EF of 45-50% along with normal pulmonary artery systolic pressure. EF has improved from 35-40% back in July 2016. Right cardiac catheterization done 11/15/16 showed severely elevated filling pressure, severe pulmonary HTN and low normal cardiac output. Pulmonary wedge pressure still elevated at 31 mmHg.   Was in the ED 03/31/17 due to viral illness and diarrhea where he was treated and released.   He presents today for his follow-up visit with a chief complaint of   Past Medical History:  Diagnosis Date  . Allergy    seasonal  . Amnestic MCI (mild cognitive impairment with memory loss)   . Asthma   . Atrial fibrillation ([place])   . Atrial fibrillation ([place]) 2016  . Benign neoplasm of colon   . CAD (coronary artery disease) 2005   had CABG  . CHF (congestive heart failure) ([place])   . Chronic venous stasis dermatitis of both lower extremities   . Cytomegaloviral disease ([place]) 2017  . Depression   . Diabetes mellitus   . Diverticulosis   . Dyspnea   . Erectile dysfunction   . ESRD (end stage renal disease) ([place]) 2005   Dialysis then renal transplant  . GERD (gastroesophageal reflux disease)   . Gout   . Hyperlipidemia    low since renal failure  . Hypertension   . Kidney transplant status, cadaveric 2012  . Obesity   . Pneumonia   . Purpura ([place])   . Sleep apnea    bipap with oxygen 2 l  . Trigger finger   . Ulcer 05/2016   Left shin  . Wears glasses    Past Surgical History:  Procedure Laterality Date  . BARIATRIC SURGERY    . CARDIAC CATHETERIZATION     [facility]  . COLONOSCOPY  WITH PROPOFOL N/A 04/22/2013   Procedure: COLONOSCOPY WITH PROPOFOL;  Surgeon: [name], MD;  [facility];  Service: Endoscopy;  Laterality: N/A;  . CORONARY ARTERY BYPASS GRAFT  2005   X 4  . DG ANGIO AV SHUNT*L*     right and left upper arms  . FASCIOTOMY  03/03/2012   Procedure: FASCIOTOMY;  Surgeon: [name], MD;  Location: [place];  Service: Orthopedics;  Laterality: Right;  FASCIOTOMY RIGHT SMALL FINGER  . FASCIOTOMY Left 08/17/2013   Procedure: FASCIOTOMY LEFT RING;  Surgeon: [name], MD;  Location: [place];  Service: Orthopedics;  Laterality: Left;  . INCISION AND DRAINAGE ABSCESS Left 10/15/2015   Procedure: INCISION AND DRAINAGE ABSCESS;  Surgeon: [name], MD;  Location: [facility];  Service: General;  Laterality: Left;  . KIDNEY TRANSPLANT  09/13/2010   cadaver--at [facility]  . RIGHT HEART CATH N/A 11/15/2016   Procedure: RIGHT HEART CATH;  Surgeon: [name], MD;  Location: [place] [facility];  Service: Cardiovascular;  Laterality: N/A;  . TYMPANIC MEMBRANE REPAIR  1/12   left  . VASECTOMY     Family History  Problem Relation Age of Onset  . Heart disease Father   . Kidney failure Father   . Kidney  disease Father   . Diabetes Maternal Grandmother   . Breast cancer Maternal Grandmother   . Valvular heart disease Mother   . Liver cancer Paternal Uncle   . Liver cancer Paternal Grandmother   . Prostate cancer Neg Hx    Social History   Tobacco Use  . Smoking status: Former Smoker    Types: Cigars    Last attempt to quit: 09/02/1994    Years since quitting: 22.9  . Smokeless tobacco: Never Used  Substance Use Topics  . Alcohol use: Yes    Alcohol/week: 0.0 - 0.6 oz    Comment: occasional- 3 in the past year   Allergies  Allergen Reactions  . Morphine Itching     Review of Systems  Constitutional: Positive for fatigue. Negative for appetite change and fever.  HENT: Positive for congestion.  Negative for postnasal drip and sore throat.   Eyes: Negative.   Respiratory: Positive for cough (productive) and shortness of breath (at times when coughing). Negative for chest tightness.   Cardiovascular: Positive for leg swelling (minimal). Negative for chest pain and palpitations.  Gastrointestinal: Positive for abdominal distention ("better"). Negative for abdominal pain.  Endocrine: Negative.   Genitourinary: Negative.   Musculoskeletal: Negative for back pain and neck pain.  Skin: Negative.   Allergic/Immunologic: Negative.   Neurological: Positive for light-headedness (when coughing). Negative for dizziness.  Hematological: Negative for adenopathy. Does not bruise/bleed easily.  Psychiatric/Behavioral: Negative for dysphoric mood and sleep disturbance. The patient is not nervous/anxious.      Physical Exam  Constitutional: He is oriented to person, place, and time. He appears well-developed and well-nourished.  HENT:  Head: Normocephalic and atraumatic.  Neck: Normal range of motion. Neck supple. No JVD present.  Cardiovascular: Normal rate. An irregular rhythm present.  Pulmonary/Chest: Effort normal. He has no wheezes. He has no rales.  Abdominal: Soft. He exhibits distension. There is no tenderness.  Musculoskeletal: He exhibits edema (trace in  bilateral lower legs). He exhibits no tenderness.  Neurological: He is alert and oriented to person, place, and time.  Skin: Skin is warm and dry.  Psychiatric: He has a normal mood and affect. His behavior is normal. Thought content normal.  Nursing note and vitals reviewed.   Assessment & Plan:  1: Chronic heart failure with preserved ejection fraction- - NYHA class II - mildly fluid overloaded today but stable - continues to weigh daily and notes a gradual weight loss. Reminded to call for an overnight weight gain of >2 pounds or a weekly weight gain of >5 pounds; he feels like his dry weight is close to 256 pounds - weight  down 6 pounds since he was last here 04/08/17 - not adding salt and has been reading food labels. Discussed the importance of closely following a 2027m sodium diet - has metolazone that he can take PRN. Says that he last took it last week - hasn't been going to the gym as much but is trying to be active - is on spironolactone and potassium; has a history of hypokalemia - BMP from 03/31/17 reviewed and shows sodium 138, potassium 4.1 and GFR >60 - saw cardiologist (Fletcher Anon 04/08/17 - has tried torsemide in the past without much relief - saw pulmonologist (Ashby Dawes 05/26/17  2: HTN- - BP looks good today - saw PCP (Silvio Pate 05/21/17 - follows with nephrology (Candiss Norse and has an upcoming appointment with him; [facility]  3: Atrial fibrillation- -  currently rate controlled at this time - on amlodipine, apixaban - not a cardioversion candidate at this time   Patient did not bring his medications nor a list. Each medication was verbally reviewed with the patient and he was encouraged to bring the bottles to every visit to confirm accuracy of list.

## 2017-08-11 ENCOUNTER — Ambulatory Visit: Payer: 59 | Admitting: Family

## 2017-08-11 LAB — HM DIABETES EYE EXAM

## 2017-08-12 ENCOUNTER — Encounter: Payer: Self-pay | Admitting: Internal Medicine

## 2017-08-13 ENCOUNTER — Telehealth: Payer: Self-pay | Admitting: Internal Medicine

## 2017-08-13 ENCOUNTER — Encounter: Payer: Self-pay | Admitting: Internal Medicine

## 2017-08-13 ENCOUNTER — Ambulatory Visit
Admission: RE | Admit: 2017-08-13 | Discharge: 2017-08-13 | Disposition: A | Discharge status: Home / Self Care | Payer: 59 | Source: Ambulatory Visit | Attending: Internal Medicine | Admitting: Internal Medicine

## 2017-08-13 ENCOUNTER — Other Ambulatory Visit
Admission: RE | Admit: 2017-08-13 | Discharge: 2017-08-13 | Disposition: A | Discharge status: Home / Self Care | Payer: 59 | Source: Ambulatory Visit | Attending: Internal Medicine | Admitting: Internal Medicine

## 2017-08-13 ENCOUNTER — Ambulatory Visit (INDEPENDENT_AMBULATORY_CARE_PROVIDER_SITE_OTHER): Payer: 59 | Admitting: Internal Medicine

## 2017-08-13 VITALS — BP 106/60 | HR 64

## 2017-08-13 DIAGNOSIS — I2510 Atherosclerotic heart disease of native coronary artery without angina pectoris: Secondary | ICD-10-CM

## 2017-08-13 DIAGNOSIS — J45998 Other asthma: Secondary | ICD-10-CM | POA: Diagnosis not present

## 2017-08-13 DIAGNOSIS — R0609 Other forms of dyspnea: Secondary | ICD-10-CM | POA: Insufficient documentation

## 2017-08-13 DIAGNOSIS — E662 Morbid (severe) obesity with alveolar hypoventilation: Secondary | ICD-10-CM | POA: Diagnosis not present

## 2017-08-13 DIAGNOSIS — I517 Cardiomegaly: Secondary | ICD-10-CM | POA: Insufficient documentation

## 2017-08-13 DIAGNOSIS — J4551 Severe persistent asthma with (acute) exacerbation: Secondary | ICD-10-CM

## 2017-08-13 DIAGNOSIS — G4733 Obstructive sleep apnea (adult) (pediatric): Secondary | ICD-10-CM | POA: Diagnosis not present

## 2017-08-13 DIAGNOSIS — L0390 Cellulitis, unspecified: Secondary | ICD-10-CM | POA: Diagnosis not present

## 2017-08-13 DIAGNOSIS — J4541 Moderate persistent asthma with (acute) exacerbation: Secondary | ICD-10-CM | POA: Diagnosis not present

## 2017-08-13 DIAGNOSIS — R0602 Shortness of breath: Secondary | ICD-10-CM | POA: Diagnosis not present

## 2017-08-13 DIAGNOSIS — I482 Chronic atrial fibrillation: Secondary | ICD-10-CM | POA: Diagnosis not present

## 2017-08-13 DIAGNOSIS — R0600 Dyspnea, unspecified: Secondary | ICD-10-CM

## 2017-08-13 DIAGNOSIS — J45901 Unspecified asthma with (acute) exacerbation: Secondary | ICD-10-CM

## 2017-08-13 LAB — CBC WITH DIFFERENTIAL/PLATELET
BASOS PCT: 1 %
Basophils Absolute: 0 10*3/uL (ref 0–0.1)
EOS ABS: 0.1 10*3/uL (ref 0–0.7)
Eosinophils Relative: 3 %
HCT: 33.9 % — ABNORMAL LOW (ref 40.0–52.0)
HEMOGLOBIN: 10.9 g/dL — AB (ref 13.0–18.0)
Lymphocytes Relative: 28 %
Lymphs Abs: 1.3 10*3/uL (ref 1.0–3.6)
MCH: 23.3 pg — ABNORMAL LOW (ref 26.0–34.0)
MCHC: 32 g/dL (ref 32.0–36.0)
MCV: 72.7 fL — ABNORMAL LOW (ref 80.0–100.0)
Monocytes Absolute: 0.7 10*3/uL (ref 0.2–1.0)
Monocytes Relative: 14 %
NEUTROS PCT: 54 %
Neutro Abs: 2.6 10*3/uL (ref 1.4–6.5)
Platelets: 126 10*3/uL — ABNORMAL LOW (ref 150–440)
RBC: 4.66 MIL/uL (ref 4.40–5.90)
RDW: 18.6 % — ABNORMAL HIGH (ref 11.5–14.5)
WBC: 4.8 10*3/uL (ref 3.8–10.6)

## 2017-08-13 MED ORDER — DOXYCYCLINE HYCLATE 100 MG PO CAPS: 100.0000 mg | ORAL_CAPSULE | Freq: Two times a day (BID) | ORAL | 0 refills | Status: DC

## 2017-08-13 MED ORDER — IPRATROPIUM-ALBUTEROL 0.5-2.5 (3) MG/3ML IN SOLN
3.0000 mL | Freq: Four times a day (QID) | RESPIRATORY_TRACT | Status: DC
  Administered 2017-08-13: 3 mL via RESPIRATORY_TRACT

## 2017-08-13 MED ORDER — ALBUTEROL SULFATE (2.5 MG/3ML) 0.083% IN NEBU: 2.5000 mg | INHALATION_SOLUTION | Freq: Four times a day (QID) | RESPIRATORY_TRACT | 12 refills | Status: DC | PRN

## 2017-08-13 MED ORDER — AMBULATORY NON FORMULARY MEDICATION: 0 refills | Status: DC

## 2017-08-13 MED ORDER — PREDNISONE 10 MG (21) PO TBPK: ORAL_TABLET | ORAL | 0 refills | Status: DC

## 2017-08-13 NOTE — Telephone Encounter (Signed)
Pt states the rx for nebulizer is not correct. Please call .

## 2017-08-13 NOTE — Telephone Encounter (Signed)
Referral will be faxed once signed by provider.

## 2017-08-13 NOTE — Telephone Encounter (Signed)
Entered DME referral as well for nebulizer.

## 2017-08-13 NOTE — Addendum Note (Signed)
Addended by: Santiago Bur on: 08/13/2017 10:45 AM   Modules accepted: Orders

## 2017-08-13 NOTE — Patient Instructions (Signed)
Will give prescription for nebulizer with medications to be used 3-4 times a day until you feel better. We will get blood work and chest x-ray today, will also send in prescriptions for steroids and antibiotics.  If not feeling better or feeling worse over the next 24 hours you should go to the ED.

## 2017-08-13 NOTE — Progress Notes (Signed)
[facility]     Assessment and Plan:  Acute asthma exacerbation with acute hypoxic respiratory failure. -Asthma exacerbation with hypoxemia and acute respiratory failure.  -Patient has not been using Qvar inhaler properly, discussed how he can use this regularly by using it properly 2 puffs twice daily, patient was again demonstrated its technique today. --Will check IgE, CBC with diff, CXR.  Will give a nebulizer machine with medications.  Give course of doxycycline and 6-day prednisone taper. --We discussed potentially going to the ED, will see how he does over next 24 hours, if doing better we may be able to avoid, if not doing better or does worse, then pt should go to ED.   Allergic rhinitis. -Persistent nasal allergies particular in the spring and summertime.  We discussed that nasal drainage can contribute to asthma flareups, therefore it is important to treat this going forwards. -Continue Singulair nightly.  Obstructive sleep apnea. -Doing very well on BiPAP 14/9, encouraged to continue.  Meds ordered this encounter  Medications  . predniSONE (STERAPRED UNI-PAK 21 TAB) 10 MG (21) TBPK tablet    Sig: Take as directed.    Dispense:  21 tablet    Refill:  0  . doxycycline (VIBRAMYCIN) 100 MG capsule    Sig: Take 1 capsule (100 mg total) by mouth 2 (two) times daily.    Dispense:  10 capsule    Refill:  0  . ipratropium-albuterol (DUONEB) 0.5-2.5 (3) MG/3ML nebulizer solution 3 mL   Orders Placed This Encounter  Procedures  . DME Nebulizer machine  . DG Chest 2 View  . CBC with Differential/Platelet  . IgE    Return in about 3 months (around 11/13/2017).    Date: 08/13/2017  MRN# [id number] [name] 03-Dec-1960    [name] is a 57 y.o. old male seen in consultation for chief complaint of:    Chief Complaint  Patient presents with  . Acute Visit    pt very fatigue started Monday.   . Cough    non productive  . Shortness of Breath     pt has Qvar which he has not used due to not knowing how to use. He has Proair and Albuterol.  . Chest Pain    HPI:   Synopsis: [name] was previously followed by Dr. Halford Chessman for asthma and obesity hypoventilation syndrome with OSA.  History of renal transplant.  She has been maintained on BiPAP for OHS, and Qvar for asthma.  At last visit the patient was recovering from a recent asthma exacerbation, and was on a prednisone taper.  It was noted that he would not really been using Qvar regularly, therefore this was reinforced. He comes in today as an acute visit. He has been having trouble breathing, he has been exposed to a lot of allergens, and his AC broke as well so they had to open the windows. He feels that his breathing is rough and his chest is tight with sneezing.  He has oxygen at home with his bipap but he was not on it during the day. Today his oxygen was at 84% upon walking in, when he has been checking at home it has been as low as in the 70's. Currently his sat is 89%.  He is currently using albuterol 2 to 3 times per day. He has not been using qvar because he could not figure out how.  He is using his CPAP regularly every night, he is much more  awake than before starting it.  He is not a smoker.  He denies reflux he does have sinus drainage is on nasonex.  He has never been tested for allergies, he notes that spring and fall come out he has a lot of nasal and eye congestion.   **Review of download data 30 days as of 08/12/2017.  Uses greater than 4 hours is 29/30 days.  Average usage on days used 7 hours 12 minutes.  Settings is 14/9.  Residual AHI is 2.6.  Overall this shows excellent compliance with CPAP, with excellent control of obstructive sleep apnea on BiPAP at 14/9.   PSG 03/15/12 >> AHI 57.9, low SpO2 78%. CPAP 12 cm H2O  CT chest [facility]) 09/24/12 >> central bronchial thickening, LUL pleural scarring  V/Q scan [facility]) 09/24/12 >> matched defect Lt upper lobe  ABG  [facility]) 09/25/12 >> pH 7.40, PaCO2 68.2, PaO2 61.5  Echo bubble study [facility]) 09/26/12 >> poor study, no identified shunt  Room air SpO2 10/27/12 >> 87%, start 2 liters oxygen 24/7  ONO with CPAP and RA 11/02/12 >> Test time 1 hr 22 min. Mean SpO2 76%, low SpO2 59%. Spent 1 hr 16 min with SpO2 < 88%.  CPAP 10/15/12 to 11/13/12 >> used on 30 of 30 nights with average 4 hrs 22 min. Average AHI 3.8 with CPAP 12 cm H2O.  PFT 11/27/12 >> FEV1 1.74, FEV1% 80%, TLC 4.16 (60%), DLCO 81%, +BD  CPAP 11/16/12 to 12/15/12 >> Used on 30 of 30 nights with average 3 hrs 56 min. Average AHI 3.7 with CPAP 12 cm H2O.  ONO with CPAP and 2 liters 11/27/12 >> Test time 4 hrs 39 min. Basal SpO2 88%, low SpO2 67%. Spent 117 min with SpO2 < 88%  Echo 04/27/13 >> EF 35 to 40%, mod LVH, grade 2 diastolic dysfunction, severe LA dilation, mod RA dilation  ABG on room air 05/12/13 >> pH 7.41, PaCO2 70, PaO2 < 43  BiPAP 06/21/13 >> 14/9 cm H2O with 2 liters oxygen >> AHI 0.   Medication:    Current Outpatient Medications:  .  albuterol (PROVENTIL HFA;VENTOLIN HFA) 108 (90 Base) MCG/ACT inhaler, Inhale 2 puffs every 6 (six) hours as needed into the lungs for wheezing or shortness of breath., Disp: 1 Inhaler, Rfl: 2 .  amLODipine (NORVASC) 5 MG tablet, TAKE 1 TABLET BY MOUTH DAILY, Disp: 90 tablet, Rfl: 3 .  apixaban (ELIQUIS) 5 MG TABS tablet, Take 1 tablet (5 mg total) by mouth 2 (two) times daily., Disp: 180 tablet, Rfl: 3 .  buPROPion (WELLBUTRIN) 75 MG tablet, TAKE 1 TABLET BY MOUTH TWICE DAILY, Disp: 180 tablet, Rfl: 3 .  furosemide (LASIX) 40 MG tablet, Take 1 tablet (40 mg total) by mouth 2 (two) times daily. Take an additional 87m in the PM as needed for weight gain., Disp: 100 tablet, Rfl: 3 .  insulin glargine (LANTUS) 100 unit/mL SOPN, Inject 0.1 mLs (10 Units total) into the skin at bedtime. Currently using 15 units (Patient taking differently: Inject 10 Units into the skin at bedtime. Currently using 10 units), Disp: 15  mL, Rfl: 11 .  lisinopril (PRINIVIL,ZESTRIL) 40 MG tablet, TAKE 1 TABLET BY MOUTH DAILY, Disp: 30 tablet, Rfl: 3 .  metolazone (ZAROXOLYN) 5 MG tablet, TAKE 1 TABLET BY MOUTH AS NEEDED, Disp: 15 tablet, Rfl: 0 .  mometasone (NASONEX) 50 MCG/ACT nasal spray, PLACE 2 SPRAYS INTO THE NOSE AT BEDTIME., Disp: 17 g, Rfl: 11 .  montelukast (SINGULAIR) 10 MG  tablet, Take 1 tablet (10 mg total) by mouth at bedtime., Disp: 30 tablet, Rfl: 5 .  Multiple Vitamin (MULTIVITAMIN WITH MINERALS) TABS tablet, Take 1 tablet by mouth daily., Disp: , Rfl:  .  mycophenolate (MYFORTIC) 180 MG EC tablet, Take 360 mg by mouth 2 (two) times daily. , Disp: , Rfl:  .  omeprazole (PRILOSEC) 20 MG capsule, TAKE 1 CAPSULE BY MOUTH ONCE DAILY, Disp: 90 capsule, Rfl: 3 .  potassium chloride (MICRO-K) 10 MEQ CR capsule, Take 2 capsules (20 mEq total) by mouth 2 (two) times daily., Disp: 360 capsule, Rfl: 4 .  rosuvastatin (CRESTOR) 10 MG tablet, Take 10 mg by mouth daily., Disp: , Rfl:  .  sildenafil (VIAGRA) 100 MG tablet, Take 0.5-1 tablets (50-100 mg total) by mouth daily as needed for erectile dysfunction., Disp: 10 tablet, Rfl: 11 .  spironolactone (ALDACTONE) 25 MG tablet, Take 1 tablet (25 mg total) by mouth daily., Disp: 30 tablet, Rfl: 11 .  tacrolimus (PROGRAF) 0.5 MG capsule, Take 1 mg by mouth 2 (two) times daily., Disp: , Rfl:  .  beclomethasone (QVAR) 80 MCG/ACT inhaler, Inhale 2 puffs into the lungs 2 (two) times daily. (Patient not taking: Reported on 08/13/2017), Disp: 1 Inhaler, Rfl: 6   Allergies:  Morphine  Review of Systems: Gen:  Denies  fever, sweats, chills HEENT: Denies blurred vision, double vision. bleeds, sore throat Cvc:  No dizziness, chest pain. Resp:   Denies cough or sputum production, shortness of breath Gi: Denies swallowing difficulty, stomach pain. Gu:  Denies bladder incontinence, burning urine Ext:   No Joint pain, stiffness. Skin: No skin rash,  hives  Endoc:  No polyuria,  polydipsia. Psych: No depression, insomnia. Other:  All other systems were reviewed with the patient and were negative other that what is mentioned in the HPI.   Physical Examination:   VS: BP 106/60 (BP Location: Left Arm, Cuff Size: Large)   Pulse 64   SpO2 (!) 88%   General Appearance: No distress  Neuro:without focal findings,  speech normal,  HEENT: PERRLA, EOM intact.   Pulmonary: normal breath sounds, No wheezing.  CardiovascularNormal S1,S2.  No m/r/g.   Abdomen: Benign, Soft, non-tender. Renal:  No costovertebral tenderness  GU:  No performed at this time. Endoc: No evident thyromegaly, no signs of acromegaly. Skin:   warm, no rashes, no ecchymosis  Extremities: normal, no cyanosis, clubbing.  Other findings:    LABORATORY PANEL:   CBC No results for input(s): WBC, HGB, HCT, PLT in the last 168 hours. ------------------------------------------------------------------------------------------------------------------  Chemistries  No results for input(s): NA, K, CL, CO2, GLUCOSE, BUN, CREATININE, CALCIUM, MG, AST, ALT, ALKPHOS, BILITOT in the last 168 hours.  Invalid input(s): GFRCGP ------------------------------------------------------------------------------------------------------------------  Cardiac Enzymes No results for input(s): TROPONINI in the last 168 hours. ------------------------------------------------------------  RADIOLOGY:  No results found.     Thank  you for the consultation and for allowing [facility] to assist in the care of your patient. Our recommendations are noted above.  Please contact us if we can be of further service.   Marda Stalker, MD.  Board Certified in Internal Medicine, Pulmonary Medicine, [place], and Sleep Medicine.  [facility] Office Number: [phone]  Patricia Pesa, M.D.  Merton Border, M.D  08/13/2017

## 2017-08-19 ENCOUNTER — Other Ambulatory Visit: Payer: Self-pay | Admitting: Cardiovascular Disease

## 2017-08-19 LAB — IGE: IgE (Immunoglobulin E), Serum: <2 IU/mL — ABNORMAL LOW (ref 6–495)

## 2017-08-19 NOTE — Telephone Encounter (Signed)
Please review for refill, Thanks !  

## 2017-08-22 ENCOUNTER — Ambulatory Visit: Payer: 59 | Attending: Family | Admitting: Family

## 2017-08-22 ENCOUNTER — Encounter: Payer: Self-pay | Admitting: Family

## 2017-08-22 VITALS — BP 137/70 | HR 62 | Resp 18 | Ht 71.0 in | Wt 266.0 lb

## 2017-08-22 DIAGNOSIS — Z794 Long term (current) use of insulin: Secondary | ICD-10-CM | POA: Diagnosis not present

## 2017-08-22 DIAGNOSIS — K219 Gastro-esophageal reflux disease without esophagitis: Secondary | ICD-10-CM | POA: Insufficient documentation

## 2017-08-22 DIAGNOSIS — I110 Hypertensive heart disease with heart failure: Secondary | ICD-10-CM | POA: Diagnosis not present

## 2017-08-22 DIAGNOSIS — I4891 Unspecified atrial fibrillation: Secondary | ICD-10-CM | POA: Insufficient documentation

## 2017-08-22 DIAGNOSIS — M7989 Other specified soft tissue disorders: Secondary | ICD-10-CM | POA: Diagnosis present

## 2017-08-22 DIAGNOSIS — Z7901 Long term (current) use of anticoagulants: Secondary | ICD-10-CM | POA: Diagnosis not present

## 2017-08-22 DIAGNOSIS — E785 Hyperlipidemia, unspecified: Secondary | ICD-10-CM | POA: Diagnosis not present

## 2017-08-22 DIAGNOSIS — Z951 Presence of aortocoronary bypass graft: Secondary | ICD-10-CM | POA: Diagnosis not present

## 2017-08-22 DIAGNOSIS — I5032 Chronic diastolic (congestive) heart failure: Secondary | ICD-10-CM | POA: Diagnosis not present

## 2017-08-22 DIAGNOSIS — I482 Chronic atrial fibrillation, unspecified: Secondary | ICD-10-CM

## 2017-08-22 DIAGNOSIS — I2510 Atherosclerotic heart disease of native coronary artery without angina pectoris: Secondary | ICD-10-CM | POA: Diagnosis not present

## 2017-08-22 DIAGNOSIS — J45909 Unspecified asthma, uncomplicated: Secondary | ICD-10-CM | POA: Insufficient documentation

## 2017-08-22 DIAGNOSIS — E1121 Type 2 diabetes mellitus with diabetic nephropathy: Secondary | ICD-10-CM

## 2017-08-22 DIAGNOSIS — Z87891 Personal history of nicotine dependence: Secondary | ICD-10-CM | POA: Diagnosis not present

## 2017-08-22 DIAGNOSIS — I10 Essential (primary) hypertension: Secondary | ICD-10-CM

## 2017-08-22 DIAGNOSIS — Z940 Kidney transplant status: Secondary | ICD-10-CM | POA: Insufficient documentation

## 2017-08-22 DIAGNOSIS — E119 Type 2 diabetes mellitus without complications: Secondary | ICD-10-CM | POA: Insufficient documentation

## 2017-08-22 DIAGNOSIS — Z79899 Other long term (current) drug therapy: Secondary | ICD-10-CM | POA: Diagnosis not present

## 2017-08-22 DIAGNOSIS — G4733 Obstructive sleep apnea (adult) (pediatric): Secondary | ICD-10-CM | POA: Diagnosis not present

## 2017-08-22 LAB — MAGNESIUM: Magnesium: 2.1 mg/dL (ref 1.7–2.4)

## 2017-08-22 LAB — BASIC METABOLIC PANEL
Anion gap: 8 (ref 5–15)
BUN: 33 mg/dL — ABNORMAL HIGH (ref 6–20)
CHLORIDE: 103 mmol/L (ref 101–111)
CO2: 30 mmol/L (ref 22–32)
CREATININE: 1.03 mg/dL (ref 0.61–1.24)
Calcium: 9.4 mg/dL (ref 8.9–10.3)
GFR calc non Af Amer: >60 mL/min (ref >60)
GLUCOSE: 158 mg/dL — AB (ref 65–99)
Potassium: 3.8 mmol/L (ref 3.5–5.1)
Sodium: 141 mmol/L (ref 135–145)

## 2017-08-22 NOTE — Patient Instructions (Signed)
Continue weighing daily and call for an overnight weight gain of > 2 pounds or a weekly weight gain of >5 pounds.

## 2017-08-22 NOTE — Progress Notes (Signed)
Patient ID: [name], male    DOB: 1961-03-22, 57 y.o.   MRN   MRN: [id number]  HPI  [name] is a 57 y/o with a history of asthma, atrial fibrillation, CAD (CABG), chronic venous stastis of lower legs, depression, DM, renal disease (s/p renal transplant), GERD, HTN, hyperlipidemia, obstructive sleep apnea (with oxygen), remote tobacco use and chronic heart failure.   Echo done 10/16/16 showed an EF of 45-50% along with normal pulmonary artery systolic pressure. EF has improved from 35-40% back in July 2016. Right cardiac catheterization done 11/15/16 showed severely elevated filling pressure, severe pulmonary HTN and low normal cardiac output. Pulmonary wedge pressure still elevated at 31 mmHg.   Was in the ED 03/31/17 due to viral illness and diarrhea where he was treated and released.   08/13/17 saw Dr. Ashby Dawes for management of obstructive sleep apnea, allergic rhinitis, and acute asthma exacerbation. He received re-education on proper technique for use of Qvar Patient is on BiPAP at home for OSA. Was given doxycyline and short course of prednisone.  He presents today for his follow-up visit with a chief complaint of minimal lower extremity swelling but holding onto some fluid in his abdomen. He endorses some SOB but attributes this to allergies. He has had some weight gain since his last recorded weight on 05/26/17. He states that he stopped taking his lasix twice a day and switched to once a day because he had to stop working to go to the bathroom so often. He finished the course of doxycyline that Dr. Juanell Fairly prescribed and stopped taking the prednisone because it made his blood sugar increase. He states his SOB is better than when he last saw Dr. Juanell Fairly as he now understands how to use his inhalers better.    Past Medical History:  Diagnosis Date  . Allergy    seasonal  . Amnestic MCI (mild cognitive impairment with memory loss)   . Asthma   . Atrial fibrillation ([name])   . Atrial fibrillation  ([name]) 2016  . Benign neoplasm of colon   . CAD (coronary artery disease) 2005   had CABG  . CHF (congestive heart failure) ([place])   . Chronic venous stasis dermatitis of both lower extremities   . Cytomegaloviral disease ([name]) 2017  . Depression   . Diabetes mellitus   . Diverticulosis   . Dyspnea   . Erectile dysfunction   . ESRD (end stage renal disease) ([name]) 2005   Dialysis then renal transplant  . GERD (gastroesophageal reflux disease)   . Gout   . Hyperlipidemia    low since renal failure  . Hypertension   . Kidney transplant status, cadaveric 2012  . Obesity   . Pneumonia   . Purpura ([place])   . Sleep apnea    bipap with oxygen 2 l  . Trigger finger   . Ulcer 05/2016   Left shin  . Wears glasses    Past Surgical History:  Procedure Laterality Date  . BARIATRIC SURGERY    . CARDIAC CATHETERIZATION     [facility]  . COLONOSCOPY WITH PROPOFOL N/A 04/22/2013   Procedure: COLONOSCOPY WITH PROPOFOL;  Surgeon: [name], MD;  Location: [facility];  Service: Endoscopy;  Laterality: N/A;  . CORONARY ARTERY BYPASS GRAFT  2005   X 4  . DG ANGIO AV SHUNT*L*     right and left upper arms  . FASCIOTOMY  03/03/2012   Procedure: FASCIOTOMY;  Surgeon: [name], MD;  Location: [facility];  Service: Orthopedics;  Laterality: Right;  FASCIOTOMY RIGHT SMALL FINGER  . FASCIOTOMY Left 08/17/2013   Procedure: FASCIOTOMY LEFT RING;  Surgeon: [name], MD;  Location: [facility];  Service: Orthopedics;  Laterality: Left;  . INCISION AND DRAINAGE ABSCESS Left 10/15/2015   Procedure: INCISION AND DRAINAGE ABSCESS;  Surgeon: [name], MD;  Location: [facility];  Service: General;  Laterality: Left;  . KIDNEY TRANSPLANT  09/13/2010   [facility]ada[facility]--at [facility]  . RIGHT HEART CATH N/A 11/15/2016   Procedure: RIGHT HEART CATH;  Surgeon: [name], MD;  Location: [facility];  Service: Cardiovascular;  Laterality: N/A;  . TYMPANIC MEMBRANE REPAIR   1/12   left  . VASECTOMY     Family History  Problem Relation Age of Onset  . Heart disease Father   . Kidney failure Father   . Kidney disease Father   . Diabetes Maternal Grandmother   . Breast cancer Maternal Grandmother   . Valvular heart disease Mother   . Liver cancer Paternal Uncle   . Liver cancer Paternal Grandmother   . Prostate cancer Neg Hx    Social History   Tobacco Use  . Smoking status: Former Smoker    Types: Cigars    Last attempt to quit: 09/02/1994    Years since quitting: 22.9  . Smokeless tobacco: Never Used  Substance Use Topics  . Alcohol use: Yes    Alcohol/week: 0.0 - 0.6 oz    Comment: occasional- 3 in the past year   Allergies  Allergen Reactions  . Morphine Itching   Prior to Admission medications   Medication Sig Start Date End Date Taking? Authorizing Provider  albuterol (PROVENTIL HFA;VENTOLIN HFA) 108 (90 Base) MCG/ACT inhaler Inhale 2 puffs every 6 (six) hours as needed into the lungs for wheezing or shortness of breath. 02/10/17  Yes [name] I, MD  amLODipine (NORVASC) 5 MG tablet TAKE 1 TABLET BY MOUTH DAILY 06/21/16  Yes [name], MD  apixaban (ELIQUIS) 5 MG TABS tablet Take 1 tablet (5 mg total) by mouth 2 (two) times daily. 04/08/17  Yes [name], MD  beclomethasone (QVAR) 80 MCG/ACT inhaler Inhale 2 puffs into the lungs 2 (two) times daily. 05/10/16  Yes [name], MD  buPROPion (WELLBUTRIN) 75 MG tablet TAKE 1 TABLET BY MOUTH TWICE DAILY 04/14/17  Yes [name], MD  chlorpheniramine-HYDROcodone [facility] PENNKINETIC ER) 10-8 MG/5ML SUER Take 5 mLs by mouth every 12 (twelve) hours as needed for cough. 03/31/17  Yes [name], PA-C  furosemide (LASIX) 40 MG tablet Take 1 tablet (40 mg total) by mouth 2 (two) times daily. Take an additional 25m in the PM as needed for weight gain. 01/20/17 05/14/17 Yes [name], [name], FNP  insulin glargine (LANTUS) 100 unit/mL SOPN Inject 0.1 mLs (10 Units  total) into the skin at bedtime. Currently using 15 units Patient taking differently: Inject 10 Units into the skin at bedtime. Currently using 10 units 11/16/16  Yes [name] MD  lisinopril (PRINIVIL,ZESTRIL) 40 MG tablet TAKE 1 TABLET BY MOUTH DAILY 04/07/17  Yes AWellington Hampshire MD  metolazone (ZAROXOLYN) 5 MG tablet TAKE 1 TABLET BY MOUTH AS NEEDED 03/13/17  Yes HDarylene PriceA, FNP  mometasone (NASONEX) 50 MCG/ACT nasal spray PLACE 2 SPRAYS INTO THE NOSE AT BEDTIME. 11/26/16  Yes LVenia Carbon MD  Multiple Vitamin (MULTIVITAMIN WITH MINERALS) TABS tablet Take 1 tablet by mouth daily.  Yes [provider]  mycophenolate (MYFORTIC) 180 MG EC tablet Take 360 mg by mouth 2 (two) times daily.    Yes [provider]  omeprazole (PRILOSEC) 20 MG capsule TAKE 1 CAPSULE BY MOUTH ONCE DAILY 02/21/16  Yes [name], MD  potassium chloride (MICRO-K) 10 MEQ CR capsule Take 2 capsules (20 mEq total) by mouth 2 (two) times daily. 05/02/17  Yes Darylene Price A, FNP  rosuvastatin (CRESTOR) 10 MG tablet Take 10 mg by mouth daily.   Yes [provider]  sildenafil (VIAGRA) 100 MG tablet Take 0.5-1 tablets (50-100 mg total) by mouth daily as needed for erectile dysfunction. 01/03/17  Yes [name], MD  spironolactone (ALDACTONE) 25 MG tablet Take 1 tablet (25 mg total) by mouth daily. 11/16/16 11/16/17 Yes Fritzi Mandes, MD  sulfamethoxazole-trimethoprim (BACTRIM,SEPTRA) 400-80 MG tablet Take 1 tablet by mouth 3 (three) times a week.   Yes [provider]  tacrolimus (PROGRAF) 0.5 MG capsule Take 1 mg by mouth 2 (two) times daily.   Yes [provider]    Review of Systems  Constitutional: Negative for appetite change, fatigue and fever.  HENT: Positive for congestion. Negative for postnasal drip and sore throat.   Eyes: Negative.   Respiratory: Positive for cough (better). Negative for chest tightness and shortness of breath.   Cardiovascular:  Negative for chest pain, palpitations and leg swelling.  Gastrointestinal: Positive for abdominal distention. Negative for abdominal pain.  Endocrine: Negative.   Genitourinary: Negative.   Musculoskeletal: Negative for back pain and neck pain.  Skin: Negative.   Allergic/Immunologic: Negative.   Neurological: Negative for dizziness and light-headedness.       Cramping in hands at times  Hematological: Negative for adenopathy. Does not bruise/bleed easily.  Psychiatric/Behavioral: Negative for dysphoric mood and sleep disturbance (wearing bipap nightly). The patient is not nervous/anxious.    Vitals:   08/22/17 0850  BP: 137/70  Pulse: 62  Resp: 18  SpO2: 97%  Weight: 266 lb (120.7 kg)  Height: _0  (1.803 m)   Wt Readings from Last 3 Encounters:  08/22/17 266 lb (120.7 kg)  05/26/17 260 lb (117.9 kg)  05/21/17 272 lb (123.4 kg)   Lab Results  Component Value Date   CREATININE 1.2 06/20/2017   CREATININE 1.12 03/31/2017   CREATININE 1.10 02/05/2017    Physical Exam  Constitutional: He is oriented to person, place, and time. He appears well-developed and well-nourished.  HENT:  Head: Normocephalic and atraumatic.  Neck: Normal range of motion. Neck supple. No JVD present.  Cardiovascular: Normal rate. An irregular rhythm present.  Pulmonary/Chest: Effort normal. He has no wheezes. He has no rales.  Abdominal: Soft. He exhibits distension. There is no tenderness.  Musculoskeletal: He exhibits no edema or tenderness.  Neurological: He is alert and oriented to person, place, and time.  Skin: Skin is warm and dry.  Psychiatric: He has a normal mood and affect. His behavior is normal. Thought content normal.  Nursing note and vitals reviewed.   Assessment & Plan:  1: Chronic heart failure with preserved ejection fraction- - NYHA class II - mildly fluid overloaded today but stable - continues to weigh daily. Reminded to call for an overnight weight gain of >2 pounds or  a weekly weight gain of >5 pounds; he states his weight has ranged over 258 - 266 over the past couple of weeks - weight down 2 pounds since he was last here 05/14/17 but up 6 pounds since  his visit with Dr. Juanell Fairly on 05/26/17 - not adding salt and has been reading food labels. Discussed the importance of closely following a 2053m sodium diet - now on vegan diet  - has metolazone that he can take PRN. Instructed him to take this as he is holding onto some fluid in his abdomen - instructed him to start taking lasix twice daily again as it is summertime and he can get to the bathroom more easily - he started taking K 328m BID because he was having some cramping and felt his K was low - hasn't been going to the gym as much but is trying to be active - is on spironolactone and potassium; has a history of hypokalemia - BMP from 03/31/17 reviewed and shows sodium 138, potassium 4.1 and GFR >60 - Scr 1.2 on 06/20/17  - getting a BMP and Mg level today - saw cardiologist (AFletcher Anon1/15/19 - has tried torsemide in the past without much relief  2: HTN- - BP looks good today - saw PCP (LSilvio Pate11/19/18 - follows with nephrology (SCandiss Norseand has an upcoming appointment with him; [facility]  3: Atrial fibrillation- - currently rate controlled at this time - on amlodipine, apixaban - not a cardioversion candidate at this time  4: Diabetes- - checks blood sugar twice a day (AM and PM after dinner) and states his normal range is high 90's to 120 - on lantus 10 units at bedtime - also using SSI novolog. States he uses 1 unit if is 10 above "normal" and 5 if is 50 above "normal" - says he is using SSI everyday - this morning used 2 units - encouraged him to follow up with PCP who manages DM to determine if lantus dose needs to be slightly increased. Patient was nervous about having any hypoglycemia episodes but I assured him that it would be a  gradual increase to find the right dose of lantus  - also states his sugars were higher because of short course of prednisone which he stopped taking  - last A1c was 7.5% from 06/20/17  Patient did not bring his medications nor a list. Each medication was verbally reviewed with the patient and he was encouraged to bring the bottles to every visit to confirm accuracy of list.  Return in 4 months or sooner for any questions/problems before then.   HaLendon KaPharmD Pharmacy Resident

## 2017-08-25 DIAGNOSIS — G4733 Obstructive sleep apnea (adult) (pediatric): Secondary | ICD-10-CM | POA: Diagnosis not present

## 2017-08-25 DIAGNOSIS — E662 Morbid (severe) obesity with alveolar hypoventilation: Secondary | ICD-10-CM | POA: Diagnosis not present

## 2017-08-25 DIAGNOSIS — I482 Chronic atrial fibrillation: Secondary | ICD-10-CM | POA: Diagnosis not present

## 2017-09-10 ENCOUNTER — Other Ambulatory Visit: Payer: Self-pay | Admitting: Cardiovascular Disease

## 2017-09-16 ENCOUNTER — Other Ambulatory Visit: Payer: Self-pay | Admitting: Family

## 2017-09-24 DIAGNOSIS — G4733 Obstructive sleep apnea (adult) (pediatric): Secondary | ICD-10-CM | POA: Diagnosis not present

## 2017-09-24 DIAGNOSIS — E662 Morbid (severe) obesity with alveolar hypoventilation: Secondary | ICD-10-CM | POA: Diagnosis not present

## 2017-09-24 DIAGNOSIS — I482 Chronic atrial fibrillation: Secondary | ICD-10-CM | POA: Diagnosis not present

## 2017-10-09 ENCOUNTER — Encounter: Payer: Self-pay | Admitting: Internal Medicine

## 2017-10-09 DIAGNOSIS — E119 Type 2 diabetes mellitus without complications: Secondary | ICD-10-CM | POA: Diagnosis not present

## 2017-10-09 DIAGNOSIS — Z940 Kidney transplant status: Secondary | ICD-10-CM | POA: Diagnosis not present

## 2017-10-09 DIAGNOSIS — D649 Anemia, unspecified: Secondary | ICD-10-CM | POA: Diagnosis not present

## 2017-10-09 DIAGNOSIS — Z125 Encounter for screening for malignant neoplasm of prostate: Secondary | ICD-10-CM | POA: Diagnosis not present

## 2017-10-09 DIAGNOSIS — G4733 Obstructive sleep apnea (adult) (pediatric): Secondary | ICD-10-CM | POA: Diagnosis not present

## 2017-10-09 DIAGNOSIS — I110 Hypertensive heart disease with heart failure: Secondary | ICD-10-CM | POA: Diagnosis not present

## 2017-10-09 DIAGNOSIS — Z792 Long term (current) use of antibiotics: Secondary | ICD-10-CM | POA: Diagnosis not present

## 2017-10-09 DIAGNOSIS — R39198 Other difficulties with micturition: Secondary | ICD-10-CM | POA: Diagnosis not present

## 2017-10-09 DIAGNOSIS — B259 Cytomegaloviral disease, unspecified: Secondary | ICD-10-CM | POA: Diagnosis not present

## 2017-10-09 DIAGNOSIS — Z79899 Other long term (current) drug therapy: Secondary | ICD-10-CM | POA: Diagnosis not present

## 2017-10-09 DIAGNOSIS — D8989 Other specified disorders involving the immune mechanism, not elsewhere classified: Secondary | ICD-10-CM | POA: Diagnosis not present

## 2017-10-09 DIAGNOSIS — E8779 Other fluid overload: Secondary | ICD-10-CM | POA: Diagnosis not present

## 2017-10-09 DIAGNOSIS — I4891 Unspecified atrial fibrillation: Secondary | ICD-10-CM | POA: Diagnosis not present

## 2017-10-09 DIAGNOSIS — I2729 Other secondary pulmonary hypertension: Secondary | ICD-10-CM | POA: Diagnosis not present

## 2017-10-09 DIAGNOSIS — Z7901 Long term (current) use of anticoagulants: Secondary | ICD-10-CM | POA: Diagnosis not present

## 2017-10-09 DIAGNOSIS — E785 Hyperlipidemia, unspecified: Secondary | ICD-10-CM | POA: Diagnosis not present

## 2017-10-09 DIAGNOSIS — Z5181 Encounter for therapeutic drug level monitoring: Secondary | ICD-10-CM | POA: Diagnosis not present

## 2017-10-09 DIAGNOSIS — Z794 Long term (current) use of insulin: Secondary | ICD-10-CM | POA: Diagnosis not present

## 2017-10-09 DIAGNOSIS — R809 Proteinuria, unspecified: Secondary | ICD-10-CM | POA: Diagnosis not present

## 2017-10-09 DIAGNOSIS — I50810 Right heart failure, unspecified: Secondary | ICD-10-CM | POA: Diagnosis not present

## 2017-10-09 DIAGNOSIS — Z4822 Encounter for aftercare following kidney transplant: Secondary | ICD-10-CM | POA: Diagnosis not present

## 2017-10-09 DIAGNOSIS — K439 Ventral hernia without obstruction or gangrene: Secondary | ICD-10-CM | POA: Diagnosis not present

## 2017-10-09 DIAGNOSIS — Z7952 Long term (current) use of systemic steroids: Secondary | ICD-10-CM | POA: Diagnosis not present

## 2017-10-09 LAB — LIPID PANEL
CHOLESTEROL: 89 (ref 0–200)
LDL Cholesterol: 49

## 2017-10-09 LAB — HEMOGLOBIN A1C: HEMOGLOBIN A1C: 7.5

## 2017-10-10 DIAGNOSIS — Z940 Kidney transplant status: Secondary | ICD-10-CM | POA: Diagnosis not present

## 2017-10-10 DIAGNOSIS — Z48298 Encounter for aftercare following other organ transplant: Secondary | ICD-10-CM | POA: Diagnosis not present

## 2017-10-21 ENCOUNTER — Other Ambulatory Visit: Payer: Self-pay

## 2017-10-21 ENCOUNTER — Encounter: Payer: Self-pay | Admitting: *Deleted

## 2017-10-21 NOTE — Anesthesia Preprocedure Evaluation (Addendum)
Anesthesia Evaluation  Patient identified by MRN, date of birth, ID band Patient awake    History of Anesthesia Complications Negative for: history of anesthetic complications  Airway Mallampati: II  TM Distance: >3 FB     Dental   Pulmonary shortness of breath, asthma , sleep apnea (BIPAP with oxygen) , pneumonia, former smoker,    Pulmonary exam normal        Cardiovascular hypertension, + CAD, + Peripheral Vascular Disease and +CHF  Normal cardiovascular exam+ dysrhythmias Atrial Fibrillation      Neuro/Psych PSYCHIATRIC DISORDERS Depression    GI/Hepatic GERD  ,  Endo/Other  diabetes  Renal/GU Renal diseaseS/p renal transplant     Musculoskeletal   Abdominal   Peds  Hematology negative hematology ROS (+)   Anesthesia Other Findings   Reproductive/Obstetrics                            Anesthesia Physical Anesthesia Plan  ASA: III  Anesthesia Plan: MAC   Post-op Pain Management:    Induction:   PONV Risk Score and Plan:   Airway Management Planned:   Additional Equipment:   Intra-op Plan:   Post-operative Plan:   Informed Consent: I have reviewed the patients History and Physical, chart, labs and discussed the procedure including the risks, benefits and alternatives for the proposed anesthesia with the patient or authorized representative who has indicated his/her understanding and acceptance.   Dental Advisory Given  Plan Discussed with: CRNA  Anesthesia Plan Comments:         Anesthesia Quick Evaluation

## 2017-10-22 NOTE — Discharge Instructions (Signed)
Cataract Surgery, Care After °Refer to this sheet in the next few weeks. These instructions provide you with information about caring for yourself after your procedure. Your health care provider may also give you more specific instructions. Your treatment has been planned according to current medical practices, but problems sometimes occur. Call your health care provider if you have any problems or questions after your procedure. °What can I expect after the procedure? °After the procedure, it is common to have: °· Itching. °· Discomfort. °· Fluid discharge. °· Sensitivity to light and to touch. °· Bruising. ° °Follow these instructions at home: °Eye Care °· Check your eye every day for signs of infection. Watch for: °? Redness, swelling, or pain. °? Fluid, blood, or pus. °? Warmth. °? Bad smell. °Activity °· Avoid strenuous activities, such as playing contact sports, for as long as told by your health care provider. °· Do not drive or operate heavy machinery until your health care provider approves. °· Do not bend or lift heavy objects . Bending increases pressure in the eye. You can walk, climb stairs, and do light household chores. °· Ask your health care provider when you can return to work. If you work in a dusty environment, you may be advised to wear protective eyewear for a period of time. °General instructions °· Take or apply over-the-counter and prescription medicines only as told by your health care provider. This includes eye drops. °· Do not touch or rub your eyes. °· If you were given a protective shield, wear it as told by your health care provider. If you were not given a protective shield, wear sunglasses as told by your health care provider to protect your eyes. °· Keep the area around your eye clean and dry. Avoid swimming or allowing water to hit you directly in the face while showering until told by your health care provider. Keep soap and shampoo out of your eyes. °· Do not put a contact lens  into the affected eye or eyes until your health care provider approves. °· Keep all follow-up visits as told by your health care provider. This is important. °Contact a health care provider if: ° °· You have increased bruising around your eye. °· You have pain that is not helped with medicine. °· You have a fever. °· You have redness, swelling, or pain in your eye. °· You have fluid, blood, or pus coming from your incision. °· Your vision gets worse. °Get help right away if: °· You have sudden vision loss. °This information is not intended to replace advice given to you by your health care provider. Make sure you discuss any questions you have with your health care provider. °Document Released: 09/28/2004 Document Revised: 07/20/2015 Document Reviewed: 01/19/2015 °Elsevier Interactive Patient Education © 2018 [employer] °General Anesthesia, Adult, Care After °These instructions provide you with information about caring for yourself after your procedure. Your health care provider may also give you more specific instructions. Your treatment has been planned according to current medical practices, but problems sometimes occur. Call your health care provider if you have any problems or questions after your procedure. °What can I expect after the procedure? °After the procedure, it is common to have: °· Vomiting. °· A sore throat. °· Mental slowness. ° °It is common to feel: °· Nauseous. °· Cold or shivery. °· Sleepy. °· Tired. °· Sore or achy, even in parts of your body where you did not have surgery. ° °Follow these instructions at home: °For at least   24 hours after the procedure: °· Do not: °? Participate in activities where you could fall or become injured. °? Drive. °? Use heavy machinery. °? Drink alcohol. °? Take sleeping pills or medicines that cause drowsiness. °? Make important decisions or sign legal documents. °? Take care of children on your own. °· Rest. °Eating and drinking °· If you vomit, drink water,  juice, or soup when you can drink without vomiting. °· Drink enough fluid to keep your urine clear or pale yellow. °· Make sure you have little or no nausea before eating solid foods. °· Follow the diet recommended by your health care provider. °General instructions °· Have a responsible adult stay with you until you are awake and alert. °· Return to your normal activities as told by your health care provider. Ask your health care provider what activities are safe for you. °· Take over-the-counter and prescription medicines only as told by your health care provider. °· If you smoke, do not smoke without supervision. °· Keep all follow-up visits as told by your health care provider. This is important. °Contact a health care provider if: °· You continue to have nausea or vomiting at home, and medicines are not helpful. °· You cannot drink fluids or start eating again. °· You cannot urinate after 8-12 hours. °· You develop a skin rash. °· You have fever. °· You have increasing redness at the site of your procedure. °Get help right away if: °· You have difficulty breathing. °· You have chest pain. °· You have unexpected bleeding. °· You feel that you are having a life-threatening or urgent problem. °This information is not intended to replace advice given to you by your health care provider. Make sure you discuss any questions you have with your health care provider. °Document Released: 06/17/2000 Document Revised: 08/14/2015 Document Reviewed: 02/23/2015 °Elsevier Interactive Patient Education © 2018 [employer] ° °

## 2017-10-23 DIAGNOSIS — Z9841 Cataract extraction status, right eye: Secondary | ICD-10-CM

## 2017-10-23 HISTORY — DX: Cataract extraction status, right eye: Z98.41

## 2017-10-25 DIAGNOSIS — I482 Chronic atrial fibrillation: Secondary | ICD-10-CM | POA: Diagnosis not present

## 2017-10-25 DIAGNOSIS — E662 Morbid (severe) obesity with alveolar hypoventilation: Secondary | ICD-10-CM | POA: Diagnosis not present

## 2017-10-25 DIAGNOSIS — G4733 Obstructive sleep apnea (adult) (pediatric): Secondary | ICD-10-CM | POA: Diagnosis not present

## 2017-10-28 DIAGNOSIS — Z4822 Encounter for aftercare following kidney transplant: Secondary | ICD-10-CM | POA: Diagnosis not present

## 2017-10-29 ENCOUNTER — Ambulatory Visit: Payer: 59 | Admitting: Anesthesiology

## 2017-10-29 ENCOUNTER — Ambulatory Visit
Admission: RE | Admit: 2017-10-29 | Discharge: 2017-10-29 | Disposition: A | Discharge status: Home / Self Care | Payer: 59 | Source: Ambulatory Visit | Attending: Ophthalmology | Admitting: Ophthalmology

## 2017-10-29 ENCOUNTER — Encounter
Admission: RE | Disposition: A | Discharge status: Home / Self Care | Payer: Self-pay | Source: Ambulatory Visit | Attending: Ophthalmology

## 2017-10-29 DIAGNOSIS — H2511 Age-related nuclear cataract, right eye: Secondary | ICD-10-CM | POA: Insufficient documentation

## 2017-10-29 DIAGNOSIS — I4891 Unspecified atrial fibrillation: Secondary | ICD-10-CM | POA: Insufficient documentation

## 2017-10-29 DIAGNOSIS — I509 Heart failure, unspecified: Secondary | ICD-10-CM | POA: Diagnosis not present

## 2017-10-29 DIAGNOSIS — Z87891 Personal history of nicotine dependence: Secondary | ICD-10-CM | POA: Diagnosis not present

## 2017-10-29 DIAGNOSIS — I2510 Atherosclerotic heart disease of native coronary artery without angina pectoris: Secondary | ICD-10-CM | POA: Insufficient documentation

## 2017-10-29 DIAGNOSIS — I110 Hypertensive heart disease with heart failure: Secondary | ICD-10-CM | POA: Diagnosis not present

## 2017-10-29 DIAGNOSIS — Z951 Presence of aortocoronary bypass graft: Secondary | ICD-10-CM | POA: Diagnosis not present

## 2017-10-29 DIAGNOSIS — Z940 Kidney transplant status: Secondary | ICD-10-CM | POA: Diagnosis not present

## 2017-10-29 DIAGNOSIS — Z955 Presence of coronary angioplasty implant and graft: Secondary | ICD-10-CM | POA: Insufficient documentation

## 2017-10-29 DIAGNOSIS — H25811 Combined forms of age-related cataract, right eye: Secondary | ICD-10-CM | POA: Diagnosis not present

## 2017-10-29 DIAGNOSIS — G4730 Sleep apnea, unspecified: Secondary | ICD-10-CM | POA: Insufficient documentation

## 2017-10-29 DIAGNOSIS — E1151 Type 2 diabetes mellitus with diabetic peripheral angiopathy without gangrene: Secondary | ICD-10-CM | POA: Insufficient documentation

## 2017-10-29 DIAGNOSIS — Z9884 Bariatric surgery status: Secondary | ICD-10-CM | POA: Insufficient documentation

## 2017-10-29 HISTORY — PX: CATARACT EXTRACTION W/PHACO: SHX586

## 2017-10-29 LAB — GLUCOSE, CAPILLARY
Glucose-Capillary: 160 mg/dL — ABNORMAL HIGH (ref 70–99)
Glucose-Capillary: 143 mg/dL — ABNORMAL HIGH (ref 70–99)

## 2017-10-29 SURGERY — PHACOEMULSIFICATION, CATARACT, WITH IOL INSERTION
Anesthesia: Monitor Anesthesia Care | Site: Eye | Laterality: Right | Wound class: "Clean "

## 2017-10-29 MED ORDER — OXYCODONE HCL 5 MG PO TABS: 5.0000 mg | ORAL_TABLET | Freq: Once | ORAL | Status: DC | PRN

## 2017-10-29 MED ORDER — NA HYALUR & NA CHOND-NA HYALUR 0.4-0.35 ML IO KIT
PACK | INTRAOCULAR | Status: DC | PRN
  Administered 2017-10-29: 1 mL via INTRAOCULAR

## 2017-10-29 MED ORDER — MIDAZOLAM HCL 2 MG/2ML IJ SOLN
INTRAMUSCULAR | Status: DC | PRN
  Administered 2017-10-29: 2 mg via INTRAVENOUS

## 2017-10-29 MED ORDER — FENTANYL CITRATE (PF) 100 MCG/2ML IJ SOLN
INTRAMUSCULAR | Status: DC | PRN
  Administered 2017-10-29: 100 ug via INTRAVENOUS

## 2017-10-29 MED ORDER — FENTANYL CITRATE (PF) 100 MCG/2ML IJ SOLN: 25.0000 ug | INTRAMUSCULAR | Status: DC | PRN

## 2017-10-29 MED ORDER — CEFUROXIME OPHTHALMIC INJECTION 1 MG/0.1 ML
INJECTION | OPHTHALMIC | Status: DC | PRN
  Administered 2017-10-29: 0.1 mL via OPHTHALMIC

## 2017-10-29 MED ORDER — BRIMONIDINE TARTRATE-TIMOLOL 0.2-0.5 % OP SOLN
OPHTHALMIC | Status: DC | PRN
  Administered 2017-10-29: 1 [drp] via OPHTHALMIC

## 2017-10-29 MED ORDER — LIDOCAINE HCL (PF) 2 % IJ SOLN
INTRAOCULAR | Status: DC | PRN
  Administered 2017-10-29: 1 mL

## 2017-10-29 MED ORDER — OXYCODONE HCL 5 MG/5ML PO SOLN: 5.0000 mg | Freq: Once | ORAL | Status: DC | PRN

## 2017-10-29 MED ORDER — EPINEPHRINE PF 1 MG/ML IJ SOLN
INTRAOCULAR | Status: DC | PRN
  Administered 2017-10-29: 72 mL via OPHTHALMIC

## 2017-10-29 MED ORDER — PROMETHAZINE HCL 25 MG/ML IJ SOLN: 6.2500 mg | INTRAMUSCULAR | Status: DC | PRN

## 2017-10-29 MED ORDER — PHENYLEPHRINE HCL 10 % OP SOLN
1.0000 [drp] | OPHTHALMIC | Status: DC | PRN
  Administered 2017-10-29 (×2): 1 [drp] via OPHTHALMIC

## 2017-10-29 MED ORDER — TETRACAINE HCL 0.5 % OP SOLN
1.0000 [drp] | OPHTHALMIC | Status: DC | PRN
  Administered 2017-10-29 (×2): 1 [drp] via OPHTHALMIC

## 2017-10-29 MED ORDER — CYCLOPENTOLATE HCL 2 % OP SOLN
1.0000 [drp] | OPHTHALMIC | Status: DC | PRN
  Administered 2017-10-29 (×3): 1 [drp] via OPHTHALMIC

## 2017-10-29 MED ORDER — MEPERIDINE HCL 25 MG/ML IJ SOLN: 6.2500 mg | INTRAMUSCULAR | Status: DC | PRN

## 2017-10-29 MED ORDER — LACTATED RINGERS IV SOLN: INTRAVENOUS | Status: DC

## 2017-10-29 MED ORDER — MOXIFLOXACIN HCL 0.5 % OP SOLN
1.0000 [drp] | OPHTHALMIC | Status: DC | PRN
  Administered 2017-10-29 (×3): 1 [drp] via OPHTHALMIC

## 2017-10-29 SURGICAL SUPPLY — 27 items
CANNULA ANT/CHMB 27G (MISCELLANEOUS) ×1 IMPLANT
CANNULA ANT/CHMB 27GA (MISCELLANEOUS) ×2 IMPLANT
CARTRIDGE ABBOTT (MISCELLANEOUS) IMPLANT
GLOVE SURG LX 7.5 STRW (GLOVE) ×1
GLOVE SURG LX STRL 7.5 STRW (GLOVE) ×1 IMPLANT
GLOVE SURG TRIUMPH 8.0 PF LTX (GLOVE) ×2 IMPLANT
GOWN STRL REUS W/ TWL LRG LVL3 (GOWN DISPOSABLE) ×2 IMPLANT
GOWN STRL REUS W/TWL LRG LVL3 (GOWN DISPOSABLE) ×2
LENS IOL TECNIS ITEC 19.5 (Intraocular Lens) ×1 IMPLANT
MARKER SKIN DUAL TIP RULER LAB (MISCELLANEOUS) ×2 IMPLANT
NDL FILTER BLUNT 18X1 1/2 (NEEDLE) ×1 IMPLANT
NDL RETROBULBAR .5 NSTRL (NEEDLE) IMPLANT
NEEDLE FILTER BLUNT 18X 1/2SAF (NEEDLE) ×1
NEEDLE FILTER BLUNT 18X1 1/2 (NEEDLE) ×1 IMPLANT
PACK CATARACT BRASINGTON (MISCELLANEOUS) ×2 IMPLANT
PACK EYE AFTER SURG (MISCELLANEOUS) ×2 IMPLANT
PACK OPTHALMIC (MISCELLANEOUS) ×2 IMPLANT
RING MALYGIN 7.0 (MISCELLANEOUS) IMPLANT
SUT ETHILON 10-0 CS-B-6CS-B-6 (SUTURE)
SUT VICRYL  9 0 (SUTURE)
SUT VICRYL 9 0 (SUTURE) IMPLANT
SUTURE EHLN 10-0 CS-B-6CS-B-6 (SUTURE) IMPLANT
SYR 3ML LL SCALE MARK (SYRINGE) ×2 IMPLANT
SYR 5ML LL (SYRINGE) ×2 IMPLANT
SYR TB 1ML LUER SLIP (SYRINGE) ×2 IMPLANT
WATER STERILE IRR 500ML POUR (IV SOLUTION) ×2 IMPLANT
WIPE NON LINTING 3.25X3.25 (MISCELLANEOUS) ×2 IMPLANT

## 2017-10-29 NOTE — Anesthesia Procedure Notes (Signed)
Procedure Name: MAC Date/Time: 10/29/2017 7:36 AM Performed by: Janna Arch, CRNA Pre-anesthesia Checklist: Patient identified, Emergency Drugs available, Suction available and Patient being monitored Patient Re-evaluated:Patient Re-evaluated prior to induction Oxygen Delivery Method: Nasal cannula

## 2017-10-29 NOTE — H&P (Signed)
The History and Physical notes are on paper, have been signed, and are to be scanned. The patient remains stable and unchanged from the H&P.   Previous H&P reviewed, patient examined, and there are no changes.  [name] 10/29/2017 7:26 AM

## 2017-10-29 NOTE — Transfer of Care (Signed)
Immediate Anesthesia Transfer of Care Note  Patient: [name]  Procedure(s) Performed: CATARACT EXTRACTION PHACO AND INTRAOCULAR LENS PLACEMENT (IOC)  RIGHT DIABETIC (Right Eye)  Patient Location: PACU  Anesthesia Type: MAC  Level of Consciousness: awake, alert  and patient cooperative  Airway and Oxygen Therapy: Patient Spontanous Breathing and Patient connected to supplemental oxygen  Post-op Assessment: Post-op Vital signs reviewed, Patient's Cardiovascular Status Stable, Respiratory Function Stable, Patent Airway and No signs of Nausea or vomiting  Post-op Vital Signs: Reviewed and stable  Complications: No apparent anesthesia complications

## 2017-10-29 NOTE — Op Note (Signed)
LOCATION:  [facility]   PREOPERATIVE DIAGNOSIS:    Nuclear sclerotic cataract right eye. H25.11   POSTOPERATIVE DIAGNOSIS:  Nuclear sclerotic cataract right eye.     PROCEDURE:  Phacoemusification with posterior chamber intraocular lens placement of the right eye   LENS:   Implant Name Type Inv. Item Serial No. Manufacturer Lot No. LRB No. Used  LENS IOL DIOP 19.5 - [id number] Intraocular Lens LENS IOL DIOP 19.5 [id number] AMO  Right 1        ULTRASOUND TIME: 24 % of 0 minutes, 29 seconds.  CDE 7.0   SURGEON:  Wyonia Hough, MD   ANESTHESIA:  Topical with tetracaine drops and 2% Xylocaine jelly, augmented with 1% preservative-free intracameral lidocaine.    COMPLICATIONS:  None.   DESCRIPTION OF PROCEDURE:  The patient was identified in the holding room and transported to the operating room and placed in the supine position under the operating microscope.  The right eye was identified as the operative eye and it was prepped and draped in the usual sterile ophthalmic fashion.   A 1 millimeter clear-corneal paracentesis was made at the 12:00 position.  0.5 ml of preservative-free 1% lidocaine was injected into the anterior chamber. The anterior chamber was filled with Viscoat viscoelastic.  A 2.4 millimeter keratome was used to make a near-clear corneal incision at the 9:00 position.  A curvilinear capsulorrhexis was made with a cystotome and capsulorrhexis forceps.  Balanced salt solution was used to hydrodissect and hydrodelineate the nucleus.   Phacoemulsification was then used in stop and chop fashion to remove the lens nucleus and epinucleus.  The remaining cortex was then removed using the irrigation and aspiration handpiece. Provisc was then placed into the capsular bag to distend it for lens placement.  A lens was then injected into the capsular bag.  The remaining viscoelastic was aspirated.   Wounds were hydrated with balanced salt solution.  The anterior  chamber was inflated to a physiologic pressure with balanced salt solution.  No wound leaks were noted. Cefuroxime 0.1 ml of a 63m/ml solution was injected into the anterior chamber for a dose of 1 mg of intracameral antibiotic at the completion of the case.   Timolol and Brimonidine drops were applied to the eye.  The patient was taken to the recovery room in stable condition without complications of anesthesia or surgery.   Camden Knotek 10/29/2017, 7:50 AM

## 2017-10-29 NOTE — Anesthesia Postprocedure Evaluation (Signed)
Anesthesia Post Note  Patient: [name]  Procedure(s) Performed: CATARACT EXTRACTION PHACO AND INTRAOCULAR LENS PLACEMENT (IOC)  RIGHT DIABETIC (Right Eye)  Patient location during evaluation: PACU Anesthesia Type: MAC Level of consciousness: awake and alert Pain management: pain level controlled Vital Signs Assessment: post-procedure vital signs reviewed and stable Respiratory status: spontaneous breathing, nonlabored ventilation, respiratory function stable and patient connected to nasal cannula oxygen Cardiovascular status: blood pressure returned to baseline and stable Postop Assessment: no apparent nausea or vomiting Anesthetic complications: no    Davinity Fanara ELAINE

## 2017-11-04 ENCOUNTER — Encounter: Payer: Self-pay | Admitting: Internal Medicine

## 2017-11-10 DIAGNOSIS — H2512 Age-related nuclear cataract, left eye: Secondary | ICD-10-CM | POA: Diagnosis not present

## 2017-11-11 ENCOUNTER — Encounter: Payer: Self-pay | Admitting: *Deleted

## 2017-11-11 ENCOUNTER — Other Ambulatory Visit: Payer: Self-pay

## 2017-11-11 NOTE — Anesthesia Preprocedure Evaluation (Addendum)
Anesthesia Evaluation  Patient identified by MRN, date of birth, ID band Patient awake    Reviewed: Allergy & Precautions, H&P , NPO status , Patient's Chart, lab work & pertinent test results, reviewed documented beta blocker date and time   History of Anesthesia Complications Negative for: history of anesthetic complications  Airway Mallampati: II  TM Distance: >3 FB Neck ROM: full    Dental no notable dental hx.    Pulmonary neg pulmonary ROS, shortness of breath, asthma , sleep apnea (BIPAP with oxygen) , pneumonia, former smoker,    Pulmonary exam normal breath sounds clear to auscultation       Cardiovascular Exercise Tolerance: Good hypertension, + CAD, + Peripheral Vascular Disease and +CHF  negative cardio ROS  + dysrhythmias Atrial Fibrillation  Rhythm:regular Rate:Normal     Neuro/Psych PSYCHIATRIC DISORDERS Depression negative neurological ROS  negative psych ROS   GI/Hepatic negative GI ROS, Neg liver ROS, GERD  ,  Endo/Other  negative endocrine ROSdiabetes  Renal/GU Renal diseasenegative Renal ROSS/p renal transplant  negative genitourinary   Musculoskeletal   Abdominal   Peds  Hematology negative hematology ROS (+)   Anesthesia Other Findings   Reproductive/Obstetrics negative OB ROS                            Anesthesia Physical  Anesthesia Plan  ASA: III  Anesthesia Plan: MAC   Post-op Pain Management:    Induction:   PONV Risk Score and Plan:   Airway Management Planned:   Additional Equipment:   Intra-op Plan:   Post-operative Plan:   Informed Consent: I have reviewed the patients History and Physical, chart, labs and discussed the procedure including the risks, benefits and alternatives for the proposed anesthesia with the patient or authorized representative who has indicated his/her understanding and acceptance.   Dental Advisory Given  Plan  Discussed with: CRNA  Anesthesia Plan Comments:        Anesthesia Quick Evaluation

## 2017-11-13 NOTE — Discharge Instructions (Signed)
Cataract Surgery, Care After °Refer to this sheet in the next few weeks. These instructions provide you with information about caring for yourself after your procedure. Your health care provider may also give you more specific instructions. Your treatment has been planned according to current medical practices, but problems sometimes occur. Call your health care provider if you have any problems or questions after your procedure. °What can I expect after the procedure? °After the procedure, it is common to have: °· Itching. °· Discomfort. °· Fluid discharge. °· Sensitivity to light and to touch. °· Bruising. ° °Follow these instructions at home: °Eye Care °· Check your eye every day for signs of infection. Watch for: °? Redness, swelling, or pain. °? Fluid, blood, or pus. °? Warmth. °? Bad smell. °Activity °· Avoid strenuous activities, such as playing contact sports, for as long as told by your health care provider. °· Do not drive or operate heavy machinery until your health care provider approves. °· Do not bend or lift heavy objects . Bending increases pressure in the eye. You can walk, climb stairs, and do light household chores. °· Ask your health care provider when you can return to work. If you work in a dusty environment, you may be advised to wear protective eyewear for a period of time. °General instructions °· Take or apply over-the-counter and prescription medicines only as told by your health care provider. This includes eye drops. °· Do not touch or rub your eyes. °· If you were given a protective shield, wear it as told by your health care provider. If you were not given a protective shield, wear sunglasses as told by your health care provider to protect your eyes. °· Keep the area around your eye clean and dry. Avoid swimming or allowing water to hit you directly in the face while showering until told by your health care provider. Keep soap and shampoo out of your eyes. °· Do not put a contact lens  into the affected eye or eyes until your health care provider approves. °· Keep all follow-up visits as told by your health care provider. This is important. °Contact a health care provider if: ° °· You have increased bruising around your eye. °· You have pain that is not helped with medicine. °· You have a fever. °· You have redness, swelling, or pain in your eye. °· You have fluid, blood, or pus coming from your incision. °· Your vision gets worse. °Get help right away if: °· You have sudden vision loss. °This information is not intended to replace advice given to you by your health care provider. Make sure you discuss any questions you have with your health care provider. °Document Released: 09/28/2004 Document Revised: 07/20/2015 Document Reviewed: 01/19/2015 °Elsevier Interactive Patient Education © 2018 [employer] °General Anesthesia, Adult, Care After °These instructions provide you with information about caring for yourself after your procedure. Your health care provider may also give you more specific instructions. Your treatment has been planned according to current medical practices, but problems sometimes occur. Call your health care provider if you have any problems or questions after your procedure. °What can I expect after the procedure? °After the procedure, it is common to have: °· Vomiting. °· A sore throat. °· Mental slowness. ° °It is common to feel: °· Nauseous. °· Cold or shivery. °· Sleepy. °· Tired. °· Sore or achy, even in parts of your body where you did not have surgery. ° °Follow these instructions at home: °For at least   24 hours after the procedure: °· Do not: °? Participate in activities where you could fall or become injured. °? Drive. °? Use heavy machinery. °? Drink alcohol. °? Take sleeping pills or medicines that cause drowsiness. °? Make important decisions or sign legal documents. °? Take care of children on your own. °· Rest. °Eating and drinking °· If you vomit, drink water,  juice, or soup when you can drink without vomiting. °· Drink enough fluid to keep your urine clear or pale yellow. °· Make sure you have little or no nausea before eating solid foods. °· Follow the diet recommended by your health care provider. °General instructions °· Have a responsible adult stay with you until you are awake and alert. °· Return to your normal activities as told by your health care provider. Ask your health care provider what activities are safe for you. °· Take over-the-counter and prescription medicines only as told by your health care provider. °· If you smoke, do not smoke without supervision. °· Keep all follow-up visits as told by your health care provider. This is important. °Contact a health care provider if: °· You continue to have nausea or vomiting at home, and medicines are not helpful. °· You cannot drink fluids or start eating again. °· You cannot urinate after 8-12 hours. °· You develop a skin rash. °· You have fever. °· You have increasing redness at the site of your procedure. °Get help right away if: °· You have difficulty breathing. °· You have chest pain. °· You have unexpected bleeding. °· You feel that you are having a life-threatening or urgent problem. °This information is not intended to replace advice given to you by your health care provider. Make sure you discuss any questions you have with your health care provider. °Document Released: 06/17/2000 Document Revised: 08/14/2015 Document Reviewed: 02/23/2015 °Elsevier Interactive Patient Education © 2018 [employer] ° °

## 2017-11-18 ENCOUNTER — Ambulatory Visit
Admission: RE | Admit: 2017-11-18 | Discharge: 2017-11-18 | Disposition: A | Discharge status: Home / Self Care | Payer: 59 | Source: Ambulatory Visit | Attending: Ophthalmology | Admitting: Ophthalmology

## 2017-11-18 ENCOUNTER — Encounter
Admission: RE | Disposition: A | Discharge status: Home / Self Care | Payer: Self-pay | Source: Ambulatory Visit | Attending: Ophthalmology

## 2017-11-18 ENCOUNTER — Ambulatory Visit: Payer: 59 | Admitting: Anesthesiology

## 2017-11-18 DIAGNOSIS — H2512 Age-related nuclear cataract, left eye: Secondary | ICD-10-CM | POA: Insufficient documentation

## 2017-11-18 DIAGNOSIS — I110 Hypertensive heart disease with heart failure: Secondary | ICD-10-CM | POA: Insufficient documentation

## 2017-11-18 DIAGNOSIS — E7800 Pure hypercholesterolemia, unspecified: Secondary | ICD-10-CM | POA: Diagnosis not present

## 2017-11-18 DIAGNOSIS — G4730 Sleep apnea, unspecified: Secondary | ICD-10-CM | POA: Diagnosis not present

## 2017-11-18 DIAGNOSIS — I509 Heart failure, unspecified: Secondary | ICD-10-CM | POA: Diagnosis not present

## 2017-11-18 DIAGNOSIS — J45909 Unspecified asthma, uncomplicated: Secondary | ICD-10-CM | POA: Diagnosis not present

## 2017-11-18 DIAGNOSIS — Z9989 Dependence on other enabling machines and devices: Secondary | ICD-10-CM | POA: Diagnosis not present

## 2017-11-18 DIAGNOSIS — Z9884 Bariatric surgery status: Secondary | ICD-10-CM | POA: Diagnosis not present

## 2017-11-18 DIAGNOSIS — I739 Peripheral vascular disease, unspecified: Secondary | ICD-10-CM | POA: Diagnosis not present

## 2017-11-18 DIAGNOSIS — Z794 Long term (current) use of insulin: Secondary | ICD-10-CM | POA: Diagnosis not present

## 2017-11-18 DIAGNOSIS — I4891 Unspecified atrial fibrillation: Secondary | ICD-10-CM | POA: Insufficient documentation

## 2017-11-18 DIAGNOSIS — Z87891 Personal history of nicotine dependence: Secondary | ICD-10-CM | POA: Diagnosis not present

## 2017-11-18 DIAGNOSIS — Z951 Presence of aortocoronary bypass graft: Secondary | ICD-10-CM | POA: Diagnosis not present

## 2017-11-18 DIAGNOSIS — K219 Gastro-esophageal reflux disease without esophagitis: Secondary | ICD-10-CM | POA: Diagnosis not present

## 2017-11-18 DIAGNOSIS — Z79899 Other long term (current) drug therapy: Secondary | ICD-10-CM | POA: Diagnosis not present

## 2017-11-18 DIAGNOSIS — I2510 Atherosclerotic heart disease of native coronary artery without angina pectoris: Secondary | ICD-10-CM | POA: Insufficient documentation

## 2017-11-18 DIAGNOSIS — Z940 Kidney transplant status: Secondary | ICD-10-CM | POA: Diagnosis not present

## 2017-11-18 DIAGNOSIS — Z885 Allergy status to narcotic agent status: Secondary | ICD-10-CM | POA: Diagnosis not present

## 2017-11-18 DIAGNOSIS — Z955 Presence of coronary angioplasty implant and graft: Secondary | ICD-10-CM | POA: Insufficient documentation

## 2017-11-18 DIAGNOSIS — F329 Major depressive disorder, single episode, unspecified: Secondary | ICD-10-CM | POA: Insufficient documentation

## 2017-11-18 DIAGNOSIS — H25812 Combined forms of age-related cataract, left eye: Secondary | ICD-10-CM | POA: Diagnosis not present

## 2017-11-18 HISTORY — PX: CATARACT EXTRACTION W/PHACO: SHX586

## 2017-11-18 LAB — GLUCOSE, CAPILLARY
Glucose-Capillary: 137 mg/dL — ABNORMAL HIGH (ref 70–99)
Glucose-Capillary: 142 mg/dL — ABNORMAL HIGH (ref 70–99)

## 2017-11-18 SURGERY — PHACOEMULSIFICATION, CATARACT, WITH IOL INSERTION
Anesthesia: Monitor Anesthesia Care | Site: Eye | Laterality: Left | Wound class: Clean

## 2017-11-18 MED ORDER — FENTANYL CITRATE (PF) 100 MCG/2ML IJ SOLN
INTRAMUSCULAR | Status: DC | PRN
  Administered 2017-11-18: 100 ug via INTRAVENOUS

## 2017-11-18 MED ORDER — EPINEPHRINE PF 1 MG/ML IJ SOLN
INTRAOCULAR | Status: DC | PRN
  Administered 2017-11-18: 08:00:00 via OPHTHALMIC

## 2017-11-18 MED ORDER — MOXIFLOXACIN HCL 0.5 % OP SOLN
1.0000 [drp] | OPHTHALMIC | Status: DC | PRN
  Administered 2017-11-18 (×3): 1 [drp] via OPHTHALMIC

## 2017-11-18 MED ORDER — LIDOCAINE HCL (PF) 2 % IJ SOLN
INTRAOCULAR | Status: DC | PRN
  Administered 2017-11-18: 1 mL

## 2017-11-18 MED ORDER — ERYTHROMYCIN 5 MG/GM OP OINT
TOPICAL_OINTMENT | OPHTHALMIC | Status: DC | PRN
  Administered 2017-11-18: 1 via OPHTHALMIC

## 2017-11-18 MED ORDER — LACTATED RINGERS IV SOLN: INTRAVENOUS | Status: DC

## 2017-11-18 MED ORDER — NA HYALUR & NA CHOND-NA HYALUR 0.4-0.35 ML IO KIT
PACK | INTRAOCULAR | Status: DC | PRN
  Administered 2017-11-18: 1 mL via INTRAOCULAR

## 2017-11-18 MED ORDER — CEFUROXIME OPHTHALMIC INJECTION 1 MG/0.1 ML
INJECTION | OPHTHALMIC | Status: DC | PRN
  Administered 2017-11-18: 0.1 mL via OPHTHALMIC

## 2017-11-18 MED ORDER — BRIMONIDINE TARTRATE-TIMOLOL 0.2-0.5 % OP SOLN
OPHTHALMIC | Status: DC | PRN
  Administered 2017-11-18: 1 [drp] via OPHTHALMIC

## 2017-11-18 MED ORDER — MIDAZOLAM HCL 2 MG/2ML IJ SOLN
INTRAMUSCULAR | Status: DC | PRN
  Administered 2017-11-18: 2 mg via INTRAVENOUS

## 2017-11-18 MED ORDER — ARMC OPHTHALMIC DILATING DROPS
1.0000 | OPHTHALMIC | Status: DC | PRN
Start: 2017-11-18 — End: 2017-11-18
  Administered 2017-11-18 (×3): 1 via OPHTHALMIC

## 2017-11-18 SURGICAL SUPPLY — 25 items
CANNULA ANT/CHMB 27GA (MISCELLANEOUS) ×2 IMPLANT
CARTRIDGE ABBOTT (MISCELLANEOUS) IMPLANT
GLOVE SURG LX 7.5 STRW (GLOVE) ×1
GLOVE SURG LX STRL 7.5 STRW (GLOVE) ×1 IMPLANT
GLOVE SURG TRIUMPH 8.0 PF LTX (GLOVE) ×2 IMPLANT
GOWN STRL REUS W/ TWL LRG LVL3 (GOWN DISPOSABLE) ×2 IMPLANT
GOWN STRL REUS W/TWL LRG LVL3 (GOWN DISPOSABLE) ×2
LENS IOL TECNIS ITEC 20.0 (Intraocular Lens) ×2 IMPLANT
MARKER SKIN DUAL TIP RULER LAB (MISCELLANEOUS) ×2 IMPLANT
NDL RETROBULBAR .5 NSTRL (NEEDLE) IMPLANT
NEEDLE FILTER BLUNT 18X 1/2SAF (NEEDLE) ×1
NEEDLE FILTER BLUNT 18X1 1/2 (NEEDLE) ×1 IMPLANT
PACK CATARACT BRASINGTON (MISCELLANEOUS) ×2 IMPLANT
PACK EYE AFTER SURG (MISCELLANEOUS) ×2 IMPLANT
PACK OPTHALMIC (MISCELLANEOUS) ×2 IMPLANT
RING MALYGIN 7.0 (MISCELLANEOUS) IMPLANT
SUT ETHILON 10-0 CS-B-6CS-B-6 (SUTURE)
SUT VICRYL  9 0 (SUTURE)
SUT VICRYL 9 0 (SUTURE) IMPLANT
SUTURE EHLN 10-0 CS-B-6CS-B-6 (SUTURE) IMPLANT
SYR 3ML LL SCALE MARK (SYRINGE) ×2 IMPLANT
SYR 5ML LL (SYRINGE) ×2 IMPLANT
SYR TB 1ML LUER SLIP (SYRINGE) ×2 IMPLANT
WATER STERILE IRR 500ML POUR (IV SOLUTION) ×2 IMPLANT
WIPE NON LINTING 3.25X3.25 (MISCELLANEOUS) ×2 IMPLANT

## 2017-11-18 NOTE — Anesthesia Postprocedure Evaluation (Signed)
Anesthesia Post Note  Patient: [name]  Procedure(s) Performed: CATARACT EXTRACTION PHACO AND INTRAOCULAR LENS PLACEMENT (IOC) LEFT IVA/TOPICAL (Left Eye)  Patient location during evaluation: PACU Anesthesia Type: MAC Level of consciousness: awake and alert Pain management: pain level controlled Vital Signs Assessment: post-procedure vital signs reviewed and stable Respiratory status: spontaneous breathing, nonlabored ventilation, respiratory function stable and patient connected to nasal cannula oxygen Cardiovascular status: stable and blood pressure returned to baseline Postop Assessment: no apparent nausea or vomiting Anesthetic complications: no    Alisa Graff

## 2017-11-18 NOTE — Transfer of Care (Signed)
Immediate Anesthesia Transfer of Care Note  Patient: [name]  Procedure(s) Performed: CATARACT EXTRACTION PHACO AND INTRAOCULAR LENS PLACEMENT (IOC) LEFT IVA/TOPICAL (Left Eye)  Patient Location: PACU  Anesthesia Type: MAC  Level of Consciousness: awake, alert  and patient cooperative  Airway and Oxygen Therapy: Patient Spontanous Breathing and Patient connected to supplemental oxygen  Post-op Assessment: Post-op Vital signs reviewed, Patient's Cardiovascular Status Stable, Respiratory Function Stable, Patent Airway and No signs of Nausea or vomiting  Post-op Vital Signs: Reviewed and stable  Complications: No apparent anesthesia complications

## 2017-11-18 NOTE — Op Note (Signed)
OPERATIVE NOTE  FILMORE MOLYNEUX [id number] 11/18/2017   PREOPERATIVE DIAGNOSIS:  Nuclear sclerotic cataract left eye. H25.12   POSTOPERATIVE DIAGNOSIS:    Nuclear sclerotic cataract left eye.     PROCEDURE:  Phacoemusification with posterior chamber intraocular lens placement of the left eye   LENS:   Implant Name Type Inv. Item Serial No. Manufacturer Lot No. LRB No. Used  LENS IOL DIOP 20.0 - [id number] Intraocular Lens LENS IOL DIOP 20.0 [id number] AMO  Left 1        ULTRASOUND TIME: 12  % of 0 minutes 35 seconds, CDE 4.3  SURGEON:  Wyonia Hough, MD   ANESTHESIA:  Topical with tetracaine drops and 2% Xylocaine jelly, augmented with 1% preservative-free intracameral lidocaine.    COMPLICATIONS:  None.   DESCRIPTION OF PROCEDURE:  The patient was identified in the holding room and transported to the operating room and placed in the supine position under the operating microscope.  The left eye was identified as the operative eye and it was prepped and draped in the usual sterile ophthalmic fashion.   A 1 millimeter clear-corneal paracentesis was made at the 1:30 position.  0.5 ml of preservative-free 1% lidocaine was injected into the anterior chamber.  The anterior chamber was filled with Viscoat viscoelastic.  A 2.4 millimeter keratome was used to make a near-clear corneal incision at the 10:30 position.  .  A curvilinear capsulorrhexis was made with a cystotome and capsulorrhexis forceps.  Balanced salt solution was used to hydrodissect and hydrodelineate the nucleus.   Phacoemulsification was then used in stop and chop fashion to remove the lens nucleus and epinucleus.  The remaining cortex was then removed using the irrigation and aspiration handpiece. Provisc was then placed into the capsular bag to distend it for lens placement.  A lens was then injected into the capsular bag.  The remaining viscoelastic was aspirated.   Wounds were hydrated with balanced salt  solution.  The anterior chamber was inflated to a physiologic pressure with balanced salt solution.  No wound leaks were noted. Cefuroxime 0.1 ml of a 47m/ml solution was injected into the anterior chamber for a dose of 1 mg of intracameral antibiotic at the completion of the case.   Timolol and Brimonidine drops and erythromycin ointment were applied to the eye.  The patient was taken to the recovery room in stable condition without complications of anesthesia or surgery.  Nur Rabold 11/18/2017, 7:58 AM

## 2017-11-18 NOTE — H&P (Signed)
The History and Physical notes are on paper, have been signed, and are to be scanned. The patient remains stable and unchanged from the H&P.   Previous H&P reviewed, patient examined, and there are no changes.  [name] 11/18/2017 7:34 AM

## 2017-11-18 NOTE — Anesthesia Procedure Notes (Signed)
Procedure Name: MAC Date/Time: 11/18/2017 7:37 AM Performed by: Janna Arch, CRNA Pre-anesthesia Checklist: Patient identified, Emergency Drugs available, Suction available and Patient being monitored Patient Re-evaluated:Patient Re-evaluated prior to induction Oxygen Delivery Method: Nasal cannula

## 2017-11-19 ENCOUNTER — Encounter: Payer: Self-pay | Admitting: Ophthalmology

## 2017-11-25 ENCOUNTER — Other Ambulatory Visit: Payer: Self-pay | Admitting: Internal Medicine

## 2017-11-25 DIAGNOSIS — E662 Morbid (severe) obesity with alveolar hypoventilation: Secondary | ICD-10-CM | POA: Diagnosis not present

## 2017-11-25 DIAGNOSIS — I482 Chronic atrial fibrillation: Secondary | ICD-10-CM | POA: Diagnosis not present

## 2017-11-25 DIAGNOSIS — G4733 Obstructive sleep apnea (adult) (pediatric): Secondary | ICD-10-CM | POA: Diagnosis not present

## 2017-12-02 ENCOUNTER — Other Ambulatory Visit: Payer: Self-pay | Admitting: Family

## 2017-12-02 NOTE — Progress Notes (Deleted)
[facility]     Assessment and Plan:  Acute asthma exacerbation with acute hypoxic respiratory failure. -Asthma exacerbation with hypoxemia and acute respiratory failure.  -Patient has not been using Qvar inhaler properly, discussed how he can use this regularly by using it properly 2 puffs twice daily, patient was again demonstrated its technique today. --Will check IgE, CBC with diff, CXR.  Will give a nebulizer machine with medications.  Give course of doxycycline and 6-day prednisone taper. --We discussed potentially going to the ED, will see how he does over next 24 hours, if doing better we may be able to avoid, if not doing better or does worse, then pt should go to ED.   Allergic rhinitis. -Persistent nasal allergies particular in the spring and summertime.  We discussed that nasal drainage can contribute to asthma flareups, therefore it is important to treat this going forwards. -Continue Singulair nightly.  Obstructive sleep apnea. -Doing very well on BiPAP 14/9, encouraged to continue.  No orders of the defined types were placed in this encounter.  No orders of the defined types were placed in this encounter.   No follow-ups on file.    Date: 12/02/2017  MRN# [id number] CORDARREL STIEFEL October 04, 1960    [name] is a 57 y.o. old male seen in consultation for chief complaint of:    No chief complaint on file.   HPI:   The patient is a 57 yo male with a history of asthma and obesity hypoventilation syndrome with OSA.  History of renal transplant.  He  has been maintained on BiPAP for OHS, and Qvar for asthma.  At last visit he had not been using his qvar and he was having an exacerbation. IGe and eos were negative. He was given doxy and prednisone taper.  He is using his CPAP regularly every night, he is much more awake than before starting it.  He is not a smoker.  He denies reflux he does have sinus drainage is on nasonex.  He has never  been tested for allergies, he notes that spring and fall come out he has a lot of nasal and eye congestion.    **CBC 08/13/17>>CBC Abs eosinophils 100.  **IgE 08/13/17>> negative.  **CXR 08/13/17>>Images personally reviewed; bilateral infiltrates, elevated right diaphragm, cardiomegaly.  **Review of download data 30 days as of 08/12/2017>> Uses greater than 4 hours is 29/30 days.  Average usage on days used 7 hours 12 minutes.  Settings is 14/9.  Residual AHI is 2.6.  Overall this shows excellent compliance with CPAP, with excellent control of obstructive sleep apnea on BiPAP at 14/9.   PSG 03/15/12 >> AHI 57.9, low SpO2 78%. CPAP 12 cm H2O  CT chest [facility]) 09/24/12 >> central bronchial thickening, LUL pleural scarring  V/Q scan [facility]) 09/24/12 >> matched defect Lt upper lobe  ABG [facility]) 09/25/12 >> pH 7.40, PaCO2 68.2, PaO2 61.5  Echo bubble study [facility]) 09/26/12 >> poor study, no identified shunt  Room air SpO2 10/27/12 >> 87%, start 2 liters oxygen 24/7  ONO with CPAP and RA 11/02/12 >> Test time 1 hr 22 min. Mean SpO2 76%, low SpO2 59%. Spent 1 hr 16 min with SpO2 < 88%.  CPAP 10/15/12 to 11/13/12 >> used on 30 of 30 nights with average 4 hrs 22 min. Average AHI 3.8 with CPAP 12 cm H2O.  PFT 11/27/12 >> FEV1 1.74, FEV1% 80%, TLC 4.16 (60%), DLCO 81%, +BD  CPAP 11/16/12 to 12/15/12 >> Used on  30 of 30 nights with average 3 hrs 56 min. Average AHI 3.7 with CPAP 12 cm H2O.  ONO with CPAP and 2 liters 11/27/12 >> Test time 4 hrs 39 min. Basal SpO2 88%, low SpO2 67%. Spent 117 min with SpO2 < 88%  Echo 04/27/13 >> EF 35 to 40%, mod LVH, grade 2 diastolic dysfunction, severe LA dilation, mod RA dilation  ABG on room air 05/12/13 >> pH 7.41, PaCO2 70, PaO2 < 43  BiPAP 06/21/13 >> 14/9 cm H2O with 2 liters oxygen >> AHI 0.   Medication:    Current Outpatient Medications:  .  albuterol (PROVENTIL HFA;VENTOLIN HFA) 108 (90 Base) MCG/ACT inhaler, Inhale 2 puffs every 6 (six) hours as needed into the  lungs for wheezing or shortness of breath., Disp: 1 Inhaler, Rfl: 2 .  albuterol (PROVENTIL) (2.5 MG/3ML) 0.083% nebulizer solution, Take 3 mLs (2.5 mg total) by nebulization every 6 (six) hours as needed for wheezing or shortness of breath. (Patient not taking: Reported on 10/29/2017), Disp: 75 mL, Rfl: 12 .  AMBULATORY NON FORMULARY MEDICATION, Medication Name: Nebulizer, Disp: 1 each, Rfl: 0 .  amLODipine (NORVASC) 5 MG tablet, TAKE 1 TABLET BY MOUTH DAILY, Disp: 90 tablet, Rfl: 0 .  apixaban (ELIQUIS) 5 MG TABS tablet, Take 1 tablet (5 mg total) by mouth 2 (two) times daily., Disp: 180 tablet, Rfl: 3 .  beclomethasone (QVAR) 80 MCG/ACT inhaler, Inhale 2 puffs into the lungs 2 (two) times daily., Disp: 1 Inhaler, Rfl: 6 .  buPROPion (WELLBUTRIN) 75 MG tablet, TAKE 1 TABLET BY MOUTH TWICE DAILY, Disp: 180 tablet, Rfl: 3 .  doxycycline (VIBRAMYCIN) 100 MG capsule, Take 1 capsule (100 mg total) by mouth 2 (two) times daily. (Patient not taking: Reported on 08/22/2017), Disp: 10 capsule, Rfl: 0 .  furosemide (LASIX) 40 MG tablet, Take 1 tablet (40 mg total) by mouth 2 (two) times daily. Take an additional 75m in the PM as needed for weight gain., Disp: 100 tablet, Rfl: 3 .  insulin aspart (NOVOLOG) 100 UNIT/ML injection, Inject 1-5 Units into the skin once. Using as sliding scale. Takes 1 unit if is 10 above normal, 5 units if is 50 above normal. Upper limit of normal for him is 120., Disp: , Rfl:  .  insulin glargine (LANTUS) 100 unit/mL SOPN, Inject 0.1 mLs (10 Units total) into the skin at bedtime. Currently using 15 units (Patient taking differently: Inject 10 Units into the skin at bedtime. Currently using 10 units), Disp: 15 mL, Rfl: 11 .  lisinopril (PRINIVIL,ZESTRIL) 40 MG tablet, TAKE 1 TABLET BY MOUTH DAILY, Disp: 30 tablet, Rfl: 2 .  magnesium 30 MG tablet, Take 30 mg by mouth daily., Disp: , Rfl:  .  metolazone (ZAROXOLYN) 5 MG tablet, TAKE 1 TABLET BY MOUTH AS NEEDED, Disp: 15 tablet, Rfl:  0 .  mometasone (NASONEX) 50 MCG/ACT nasal spray, PLACE 2 SPRAYS INTO THE NOSE AT BEDTIME., Disp: 17 g, Rfl: 11 .  montelukast (SINGULAIR) 10 MG tablet, TAKE 1 TABLET BY MOUTH AT BEDTIME., Disp: 30 tablet, Rfl: 5 .  Multiple Vitamin (MULTIVITAMIN WITH MINERALS) TABS tablet, Take 1 tablet by mouth daily., Disp: , Rfl:  .  mycophenolate (MYFORTIC) 180 MG EC tablet, Take 360 mg by mouth 2 (two) times daily. , Disp: , Rfl:  .  omeprazole (PRILOSEC) 20 MG capsule, TAKE 1 CAPSULE BY MOUTH ONCE DAILY, Disp: 90 capsule, Rfl: 3 .  potassium chloride (MICRO-K) 10 MEQ CR capsule, Take 2 capsules (20 mEq  total) by mouth 2 (two) times daily. (Patient taking differently: Take 30 mEq by mouth 2 (two) times daily. ), Disp: 360 capsule, Rfl: 4 .  rosuvastatin (CRESTOR) 10 MG tablet, Take 10 mg by mouth daily., Disp: , Rfl:  .  sildenafil (VIAGRA) 100 MG tablet, Take 0.5-1 tablets (50-100 mg total) by mouth daily as needed for erectile dysfunction., Disp: 10 tablet, Rfl: 11 .  spironolactone (ALDACTONE) 25 MG tablet, Take 1 tablet (25 mg total) by mouth daily., Disp: 30 tablet, Rfl: 11 .  tacrolimus (PROGRAF) 0.5 MG capsule, Take 1 mg by mouth 2 (two) times daily., Disp: , Rfl:   Current Facility-Administered Medications:  .  ipratropium-albuterol (DUONEB) 0.5-2.5 (3) MG/3ML nebulizer solution 3 mL, 3 mL, Nebulization, Q6H, Sabino Denning, MD, 3 mL at 08/13/17 0956   Allergies:  Morphine      LABORATORY PANEL:   CBC No results for input(s): WBC, HGB, HCT, PLT in the last 168 hours. ------------------------------------------------------------------------------------------------------------------  Chemistries  No results for input(s): NA, K, CL, CO2, GLUCOSE, BUN, CREATININE, CALCIUM, MG, AST, ALT, ALKPHOS, BILITOT in the last 168 hours.  Invalid input(s): GFRCGP ------------------------------------------------------------------------------------------------------------------  Cardiac  Enzymes No results for input(s): TROPONINI in the last 168 hours. ------------------------------------------------------------  RADIOLOGY:  No results found.     Thank  you for the consultation and for allowing [facility] to assist in the care of your patient. Our recommendations are noted above.  Please contact us if we can be of further service.  Marda Stalker, M.D., F.C.C.P.  Board Certified in Internal Medicine, Pulmonary Medicine, [place], and Sleep Medicine.  [facility] Office Number: [phone]   12/02/2017

## 2017-12-03 ENCOUNTER — Ambulatory Visit: Payer: 59 | Admitting: Internal Medicine

## 2017-12-04 ENCOUNTER — Encounter: Payer: Self-pay | Admitting: Internal Medicine

## 2017-12-05 ENCOUNTER — Encounter: Payer: Self-pay | Admitting: Family

## 2017-12-11 ENCOUNTER — Other Ambulatory Visit: Payer: Self-pay | Admitting: Family

## 2017-12-11 NOTE — Progress Notes (Signed)
Patient called to say that he's been having some cramping and wondered if his potassium level was low. Will get a BMP tomorrow.

## 2017-12-12 ENCOUNTER — Encounter
Admission: RE | Admit: 2017-12-12 | Discharge: 2017-12-12 | Disposition: A | Discharge status: Home / Self Care | Payer: 59 | Source: Ambulatory Visit | Attending: Family | Admitting: Family

## 2017-12-12 DIAGNOSIS — Z01812 Encounter for preprocedural laboratory examination: Secondary | ICD-10-CM | POA: Insufficient documentation

## 2017-12-12 LAB — BASIC METABOLIC PANEL
ANION GAP: 5 (ref 5–15)
BUN: 36 mg/dL — ABNORMAL HIGH (ref 6–20)
CHLORIDE: 109 mmol/L (ref 98–111)
CO2: 24 mmol/L (ref 22–32)
CREATININE: 1.23 mg/dL (ref 0.61–1.24)
Calcium: 9 mg/dL (ref 8.9–10.3)
GFR calc non Af Amer: >60 mL/min (ref >60)
Glucose, Bld: 172 mg/dL — ABNORMAL HIGH (ref 70–99)
Potassium: 4.7 mmol/L (ref 3.5–5.1)
Sodium: 138 mmol/L (ref 135–145)

## 2017-12-16 ENCOUNTER — Other Ambulatory Visit: Payer: Self-pay | Admitting: Cardiovascular Disease

## 2017-12-21 NOTE — Progress Notes (Signed)
Patient ID: [name], male    DOB: [birth year], 56 y.o.   MRN: [id number]  HPI  [name] is a 57 y/o with a history of asthma, atrial fibrillation, CAD (CABG), chronic venous stastis of lower legs, depression, DM, renal disease (s/p renal transplant), GERD, HTN, hyperlipidemia, obstructive sleep apnea (with oxygen), remote tobacco use and chronic heart failure.   Echo done 10/16/16 showed an EF of 45-50% along with normal pulmonary artery systolic pressure. EF has improved from 35-40% back in July 2016. Right cardiac catheterization done 11/15/16 showed severely elevated filling pressure, severe pulmonary HTN and low normal cardiac output. Pulmonary wedge pressure still elevated at 31 mmHg.   Has not been admitted or been in the ED in the last 6 months.    He presents today for his follow-up visit with a chief complaint of minimal shortness of breath upon moderate exertion. He describes this as chronic in nature having been present for several years although he does feel like it's a little worse over the last few day. He has associated light-headedness, cough, pedal edema, abdominal distention and gradual weight gain. He denies any difficulty sleeping, palpitations or chest pain. Has metolazone that he can use PRN and says that he took a dose about 5 days ago. 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 Feels like he may have been drinking too much fluids trying to stay hydrated.   Past Medical History:  Diagnosis Date  . Allergy    seasonal  . Amnestic MCI (mild cognitive impairment with memory loss)   . Asthma   . Atrial fibrillation ([place])   . Atrial fibrillation ([place]) 2016  . Benign neoplasm of colon   . CAD (coronary artery disease) 2005   had CABG  . CHF (congestive heart failure) ([place])   . Chronic venous stasis dermatitis of both lower extremities   . Cytomegaloviral  disease ([place]) 2017  . Depression   . Diabetes mellitus   . Diverticulosis   . Dyspnea   . Erectile dysfunction   . ESRD (end stage renal disease) ([place]) 2005   Dialysis then renal transplant  . GERD (gastroesophageal reflux disease)   . Gout   . Hyperlipidemia    low since renal failure  . Hypertension   . Kidney transplant status, cadaveric 2012  . Obesity   . Pneumonia   . Purpura ([place])   . Sleep apnea    bipap with oxygen 2 l  . Trigger finger   . Ulcer 05/2016   Left shin  . Wears glasses    Past Surgical History:  Procedure Laterality Date  . BARIATRIC SURGERY    . CARDIAC CATHETERIZATION     [facility]  . CATARACT EXTRACTION W/PHACO Right 10/29/2017   Procedure: CATARACT EXTRACTION PHACO AND INTRAOCULAR LENS PLACEMENT ([place])  RIGHT DIABETIC;  Surgeon: [name], MD;  Location: [place];  Service: Ophthalmology;  Laterality: Right;  Diabetic - insulin  . CATARACT EXTRACTION W/PHACO Left 11/18/2017   Procedure: CATARACT EXTRACTION PHACO AND INTRAOCULAR LENS PLACEMENT ([place]) LEFT IVA/TOPICAL;  Surgeon: [name], MD;  Location: [place];  Service: Ophthalmology;  Laterality: Left;  DIABETES - insulin sleep apnea  . COLONOSCOPY WITH PROPOFOL N/A 04/22/2013   Procedure: COLONOSCOPY WITH PROPOFOL;  Surgeon: [name], MD;  Location: [facility];  Service: Endoscopy;  Laterality: N/A;  .  CORONARY ARTERY BYPASS GRAFT  2005   X 4  . DG ANGIO AV SHUNT*L*     right and left upper arms  . FASCIOTOMY  03/03/2012   Procedure: FASCIOTOMY;  Surgeon: [name], MD;  Location: [place];  Service: Orthopedics;  Laterality: Right;  FASCIOTOMY RIGHT SMALL FINGER  . FASCIOTOMY Left 08/17/2013   Procedure: FASCIOTOMY LEFT RING;  Surgeon: [name], MD;  Location: [place];  Service: Orthopedics;  Laterality: Left;  . INCISION AND DRAINAGE ABSCESS Left 10/15/2015   Procedure: INCISION AND DRAINAGE ABSCESS;   Surgeon: [name], MD;  Location: [facility];  Service: General;  Laterality: Left;  . KIDNEY TRANSPLANT  09/13/2010   cadaver--at [facility]  . RIGHT HEART CATH N/A 11/15/2016   Procedure: RIGHT HEART CATH;  Surgeon: [name], MD;  Location: [place] [facility];  Service: Cardiovascular;  Laterality: N/A;  . TYMPANIC MEMBRANE REPAIR  1/12   left  . VASECTOMY     Family History  Problem Relation Age of Onset  . Heart disease Father   . Kidney failure Father   . Kidney disease Father   . Diabetes Maternal Grandmother   . Breast cancer Maternal Grandmother   . Valvular heart disease Mother   . Liver cancer Paternal Uncle   . Liver cancer Paternal Grandmother   . Prostate cancer Neg Hx    Social History   Tobacco Use  . Smoking status: Former Smoker    Types: Cigars    Last attempt to quit: 09/02/1994    Years since quitting: 23.3  . Smokeless tobacco: Never Used  Substance Use Topics  . Alcohol use: Yes    Alcohol/week: 0.0 - 1.0 standard drinks    Comment: occasional- 3 in the past year   Allergies  Allergen Reactions  . Morphine Itching   Prior to Admission medications   Medication Sig Start Date End Date Taking? Authorizing Provider  albuterol (PROVENTIL HFA;VENTOLIN HFA) 108 (90 Base) MCG/ACT inhaler Inhale 2 puffs every 6 (six) hours as needed into the lungs for wheezing or shortness of breath. 02/10/17  Yes [name], MD  albuterol (PROVENTIL) (2.5 MG/3ML) 0.083% nebulizer solution Take 3 mLs (2.5 mg total) by nebulization every 6 (six) hours as needed for wheezing or shortness of breath. 08/13/17  Yes Laverle Hobby, MD  AMBULATORY NON FORMULARY MEDICATION Medication Name: Nebulizer 08/13/17  Yes Laverle Hobby, MD  apixaban (ELIQUIS) 5 MG TABS tablet Take 1 tablet (5 mg total) by mouth 2 (two) times daily. 04/08/17  Yes [name], MD  beclomethasone (QVAR) 80 MCG/ACT inhaler Inhale 2 puffs into the lungs 2 (two) times daily. 05/26/17   Yes Laverle Hobby, MD  buPROPion (WELLBUTRIN) 75 MG tablet TAKE 1 TABLET BY MOUTH TWICE DAILY 04/14/17  Yes [name], MD  furosemide (LASIX) 40 MG tablet TAKE 1 TABLET BY MOUTH 2 TIMES DAILY. TAKE AN ADDITIONAL 80MG IN THE PM AS NEEDED FOR WEIGHT GAIN. 12/03/17  Yes Darleene Cumpian, Otila Kluver A, FNP  insulin aspart (NOVOLOG) 100 UNIT/ML injection Inject 1-5 Units into the skin once. Using as sliding scale. Takes 1 unit if is 10 above normal, 5 units if is 50 above normal. Upper limit of normal for him is 120.   Yes [provider]  insulin glargine (LANTUS) 100 unit/mL SOPN Inject 0.1 mLs (10 Units total) into the skin at bedtime. Currently using 15 units Patient taking differently: Inject 10 Units into the  skin at bedtime. Currently using 10 units 11/16/16  Yes Fritzi Mandes, MD  lisinopril (PRINIVIL,ZESTRIL) 40 MG tablet TAKE 1 TABLET BY MOUTH DAILY 08/19/17  Yes [name], MD  magnesium 30 MG tablet Take 30 mg by mouth daily.   Yes [provider]  metolazone (ZAROXOLYN) 5 MG tablet TAKE 1 TABLET BY MOUTH AS NEEDED 12/03/17  Yes Darylene Price A, FNP  mometasone (NASONEX) 50 MCG/ACT nasal spray PLACE 2 SPRAYS INTO THE NOSE AT BEDTIME. 11/26/16  Yes [name] I, MD  montelukast (SINGULAIR) 10 MG tablet TAKE 1 TABLET BY MOUTH AT BEDTIME. 11/25/17  Yes Laverle Hobby, MD  Multiple Vitamin (MULTIVITAMIN WITH MINERALS) TABS tablet Take 1 tablet by mouth daily.   Yes [provider]  mycophenolate (MYFORTIC) 180 MG EC tablet Take 360 mg by mouth 2 (two) times daily.    Yes [provider]  omeprazole (PRILOSEC) 20 MG capsule TAKE 1 CAPSULE BY MOUTH ONCE DAILY 02/21/16  Yes [name], MD  potassium chloride (MICRO-K) 10 MEQ CR capsule Take 2 capsules (20 mEq total) by mouth 2 (two) times daily. Patient taking differently: Take 30 mEq by mouth 2 (two) times daily.  05/02/17  Yes Darylene Price A, FNP  rosuvastatin (CRESTOR) 10 MG tablet Take 10 mg by  mouth daily.   Yes [provider]  sildenafil (VIAGRA) 100 MG tablet Take 0.5-1 tablets (50-100 mg total) by mouth daily as needed for erectile dysfunction. 01/03/17  Yes [name], MD  spironolactone (ALDACTONE) 25 MG tablet Take 1 tablet (25 mg total) by mouth daily. 11/16/16 12/22/17 Yes Fritzi Mandes, MD  tacrolimus (PROGRAF) 0.5 MG capsule Take 1 mg by mouth 2 (two) times daily.   Yes [provider]  amLODipine (NORVASC) 5 MG tablet Take 1 tablet (5 mg total) by mouth daily. 12/22/17   Alisa Graff, FNP    Review of Systems  Constitutional: Negative for appetite change, fatigue and fever.  HENT: Positive for congestion. Negative for postnasal drip and sore throat.   Eyes: Negative.   Respiratory: Positive for cough (better) and shortness of breath. Negative for chest tightness.   Cardiovascular: Positive for leg swelling. Negative for chest pain and palpitations.  Gastrointestinal: Positive for abdominal distention. Negative for abdominal pain.  Endocrine: Negative.   Genitourinary: Negative.   Musculoskeletal: Negative for back pain and neck pain.  Skin: Negative.   Allergic/Immunologic: Negative.   Neurological: Positive for light-headedness (when bending over too quickly). Negative for dizziness.       Cramping in hands at times  Hematological: Negative for adenopathy. Does not bruise/bleed easily.  Psychiatric/Behavioral: Negative for dysphoric mood and sleep disturbance (wearing bipap nightly). The patient is not nervous/anxious.    Vitals:   12/22/17 1552  BP: (!) 151/56  Pulse: (!) 53  Resp: 18  SpO2: 97%  Weight: 267 lb 4 oz (121.2 kg)   Wt Readings from Last 3 Encounters:  12/22/17 267 lb 4 oz (121.2 kg)  11/18/17 270 lb (122.5 kg)  10/29/17 262 lb (118.8 kg)   Lab Results  Component Value Date   CREATININE 1.23 12/12/2017   CREATININE 1.03 08/22/2017   CREATININE 1.2 06/20/2017   Physical Exam  Constitutional: He is oriented to  person, place, and time. He appears well-developed and well-nourished.  HENT:  Head: Normocephalic and atraumatic.  Neck: Normal range of motion. Neck supple. No JVD present.  Cardiovascular: Normal rate. An irregular rhythm present.  Pulmonary/Chest: Effort normal. He has  no wheezes. He has no rales.  Abdominal: Soft. He exhibits distension. There is no tenderness.  Musculoskeletal: He exhibits tenderness (1+ pitting edema bilateral lower legs). He exhibits no edema.  Neurological: He is alert and oriented to person, place, and time.  Skin: Skin is warm and dry.  Psychiatric: He has a normal mood and affect. His behavior is normal. Thought content normal.  Nursing note and vitals reviewed.   Assessment & Plan:  1: Chronic heart failure with preserved ejection fraction- - NYHA class II - mildly fluid overloaded today  - continues to weigh daily. Reminded to call for an overnight weight gain of >2 pounds or a weekly weight gain of >5 pounds - weight unchanged from previous visit 4 months ago - not adding salt and has been reading food labels. Discussed the importance of closely following a 2025m sodium diet - has metolazone that he can take PRN and he says that he's planning to take one tomorrow. - is on spironolactone and potassium; has a history of hypokalemia - was on epleronone, potassium and spironolactone. Will not renew the epleronone explaining that that medication and spironolactone are similar medications.  - saw cardiologist (Fletcher Anon 04/08/17 - has tried torsemide in the past without much relief  2: HTN- - BP mildly elevated today but did just come from working and has been out of amlodipine for the last 4 days - saw PCP (Silvio Pate 05/21/17 - BMP from 12/12/17 reviewed and shows sodium 138, potassium 4.7, creatinine 1.23 and GFR >60 - follows with nephrology (Candiss Norse and has an upcoming appointment with him; [facility]; last seen 10/28/17  3: Diabetes- - checks blood sugar twice a day (AM and PM after dinner)  - on lantus 10 units at bedtime - also using SSI novolog. States he uses 1 unit if is 10 above "normal" and 5 if is 50 above "normal" - last A1c was 7.5% from 06/20/17  Patient did not bring his medications nor a list. Each medication was verbally reviewed with the patient and he was encouraged to bring the bottles to every visit to confirm accuracy of list.  Return in 3 months or sooner for any questions/problems before then.

## 2017-12-22 ENCOUNTER — Ambulatory Visit: Payer: 59 | Attending: Family | Admitting: Family

## 2017-12-22 ENCOUNTER — Encounter: Payer: Self-pay | Admitting: Family

## 2017-12-22 VITALS — BP 151/56 | HR 53 | Resp 18 | Wt 267.2 lb

## 2017-12-22 DIAGNOSIS — I872 Venous insufficiency (chronic) (peripheral): Secondary | ICD-10-CM | POA: Insufficient documentation

## 2017-12-22 DIAGNOSIS — I132 Hypertensive heart and chronic kidney disease with heart failure and with stage 5 chronic kidney disease, or end stage renal disease: Secondary | ICD-10-CM | POA: Diagnosis not present

## 2017-12-22 DIAGNOSIS — I4891 Unspecified atrial fibrillation: Secondary | ICD-10-CM | POA: Diagnosis not present

## 2017-12-22 DIAGNOSIS — E669 Obesity, unspecified: Secondary | ICD-10-CM | POA: Insufficient documentation

## 2017-12-22 DIAGNOSIS — M109 Gout, unspecified: Secondary | ICD-10-CM | POA: Diagnosis not present

## 2017-12-22 DIAGNOSIS — Z951 Presence of aortocoronary bypass graft: Secondary | ICD-10-CM | POA: Insufficient documentation

## 2017-12-22 DIAGNOSIS — Z794 Long term (current) use of insulin: Secondary | ICD-10-CM | POA: Insufficient documentation

## 2017-12-22 DIAGNOSIS — I10 Essential (primary) hypertension: Secondary | ICD-10-CM

## 2017-12-22 DIAGNOSIS — Z7901 Long term (current) use of anticoagulants: Secondary | ICD-10-CM | POA: Insufficient documentation

## 2017-12-22 DIAGNOSIS — F329 Major depressive disorder, single episode, unspecified: Secondary | ICD-10-CM | POA: Diagnosis not present

## 2017-12-22 DIAGNOSIS — I272 Pulmonary hypertension, unspecified: Secondary | ICD-10-CM | POA: Insufficient documentation

## 2017-12-22 DIAGNOSIS — Z940 Kidney transplant status: Secondary | ICD-10-CM | POA: Diagnosis not present

## 2017-12-22 DIAGNOSIS — E876 Hypokalemia: Secondary | ICD-10-CM | POA: Insufficient documentation

## 2017-12-22 DIAGNOSIS — I2510 Atherosclerotic heart disease of native coronary artery without angina pectoris: Secondary | ICD-10-CM | POA: Insufficient documentation

## 2017-12-22 DIAGNOSIS — I5032 Chronic diastolic (congestive) heart failure: Secondary | ICD-10-CM | POA: Diagnosis not present

## 2017-12-22 DIAGNOSIS — F17290 Nicotine dependence, other tobacco product, uncomplicated: Secondary | ICD-10-CM | POA: Insufficient documentation

## 2017-12-22 DIAGNOSIS — G4733 Obstructive sleep apnea (adult) (pediatric): Secondary | ICD-10-CM | POA: Insufficient documentation

## 2017-12-22 DIAGNOSIS — J45909 Unspecified asthma, uncomplicated: Secondary | ICD-10-CM | POA: Insufficient documentation

## 2017-12-22 DIAGNOSIS — Z79899 Other long term (current) drug therapy: Secondary | ICD-10-CM | POA: Diagnosis not present

## 2017-12-22 DIAGNOSIS — Z885 Allergy status to narcotic agent status: Secondary | ICD-10-CM | POA: Insufficient documentation

## 2017-12-22 DIAGNOSIS — E1121 Type 2 diabetes mellitus with diabetic nephropathy: Secondary | ICD-10-CM

## 2017-12-22 DIAGNOSIS — E1122 Type 2 diabetes mellitus with diabetic chronic kidney disease: Secondary | ICD-10-CM | POA: Diagnosis not present

## 2017-12-22 DIAGNOSIS — N186 End stage renal disease: Secondary | ICD-10-CM | POA: Insufficient documentation

## 2017-12-22 DIAGNOSIS — E785 Hyperlipidemia, unspecified: Secondary | ICD-10-CM | POA: Insufficient documentation

## 2017-12-22 MED ORDER — AMLODIPINE BESYLATE 5 MG PO TABS: 5.0000 mg | ORAL_TABLET | Freq: Every day | ORAL | 3 refills | Status: DC

## 2017-12-22 NOTE — Patient Instructions (Signed)
Continue weighing daily and call for an overnight weight gain of > 2 pounds or a weekly weight gain of >5 pounds. 

## 2017-12-25 DIAGNOSIS — E662 Morbid (severe) obesity with alveolar hypoventilation: Secondary | ICD-10-CM | POA: Diagnosis not present

## 2017-12-25 DIAGNOSIS — I4891 Unspecified atrial fibrillation: Secondary | ICD-10-CM | POA: Diagnosis not present

## 2017-12-25 DIAGNOSIS — G4733 Obstructive sleep apnea (adult) (pediatric): Secondary | ICD-10-CM | POA: Diagnosis not present

## 2018-01-02 DIAGNOSIS — E785 Hyperlipidemia, unspecified: Secondary | ICD-10-CM | POA: Diagnosis not present

## 2018-01-02 DIAGNOSIS — R809 Proteinuria, unspecified: Secondary | ICD-10-CM | POA: Diagnosis not present

## 2018-01-02 DIAGNOSIS — D649 Anemia, unspecified: Secondary | ICD-10-CM | POA: Diagnosis not present

## 2018-01-02 DIAGNOSIS — E119 Type 2 diabetes mellitus without complications: Secondary | ICD-10-CM | POA: Diagnosis not present

## 2018-01-02 DIAGNOSIS — Z9989 Dependence on other enabling machines and devices: Secondary | ICD-10-CM | POA: Diagnosis not present

## 2018-01-02 DIAGNOSIS — E872 Acidosis: Secondary | ICD-10-CM | POA: Diagnosis not present

## 2018-01-02 DIAGNOSIS — I50810 Right heart failure, unspecified: Secondary | ICD-10-CM | POA: Diagnosis not present

## 2018-01-02 DIAGNOSIS — Z79899 Other long term (current) drug therapy: Secondary | ICD-10-CM | POA: Diagnosis not present

## 2018-01-02 DIAGNOSIS — G4733 Obstructive sleep apnea (adult) (pediatric): Secondary | ICD-10-CM | POA: Diagnosis not present

## 2018-01-02 DIAGNOSIS — Z7901 Long term (current) use of anticoagulants: Secondary | ICD-10-CM | POA: Diagnosis not present

## 2018-01-02 DIAGNOSIS — I110 Hypertensive heart disease with heart failure: Secondary | ICD-10-CM | POA: Diagnosis not present

## 2018-01-02 DIAGNOSIS — Z940 Kidney transplant status: Secondary | ICD-10-CM | POA: Diagnosis not present

## 2018-01-02 DIAGNOSIS — Z4822 Encounter for aftercare following kidney transplant: Secondary | ICD-10-CM | POA: Diagnosis not present

## 2018-01-02 DIAGNOSIS — B259 Cytomegaloviral disease, unspecified: Secondary | ICD-10-CM | POA: Diagnosis not present

## 2018-01-02 DIAGNOSIS — I4891 Unspecified atrial fibrillation: Secondary | ICD-10-CM | POA: Diagnosis not present

## 2018-01-06 DIAGNOSIS — D2372 Other benign neoplasm of skin of left lower limb, including hip: Secondary | ICD-10-CM | POA: Diagnosis not present

## 2018-01-06 DIAGNOSIS — D235 Other benign neoplasm of skin of trunk: Secondary | ICD-10-CM | POA: Diagnosis not present

## 2018-01-06 DIAGNOSIS — D485 Neoplasm of uncertain behavior of skin: Secondary | ICD-10-CM | POA: Diagnosis not present

## 2018-01-25 DIAGNOSIS — G4733 Obstructive sleep apnea (adult) (pediatric): Secondary | ICD-10-CM | POA: Diagnosis not present

## 2018-01-25 DIAGNOSIS — E662 Morbid (severe) obesity with alveolar hypoventilation: Secondary | ICD-10-CM | POA: Diagnosis not present

## 2018-01-27 DIAGNOSIS — Z23 Encounter for immunization: Secondary | ICD-10-CM | POA: Diagnosis not present

## 2018-01-28 ENCOUNTER — Other Ambulatory Visit: Payer: Self-pay | Admitting: Internal Medicine

## 2018-01-28 DIAGNOSIS — B309 Viral conjunctivitis, unspecified: Secondary | ICD-10-CM | POA: Diagnosis not present

## 2018-01-28 NOTE — Telephone Encounter (Signed)
Approved:okay x 1 year and have him set up PE in the next few months

## 2018-01-28 NOTE — Telephone Encounter (Signed)
Pt has had several acute OVs but no CPE or regular OV for a year.

## 2018-02-03 ENCOUNTER — Other Ambulatory Visit: Payer: Self-pay | Admitting: Internal Medicine

## 2018-02-17 DIAGNOSIS — Z794 Long term (current) use of insulin: Secondary | ICD-10-CM | POA: Diagnosis not present

## 2018-02-17 DIAGNOSIS — E669 Obesity, unspecified: Secondary | ICD-10-CM | POA: Diagnosis not present

## 2018-02-17 DIAGNOSIS — E1169 Type 2 diabetes mellitus with other specified complication: Secondary | ICD-10-CM | POA: Diagnosis not present

## 2018-02-17 DIAGNOSIS — E113293 Type 2 diabetes mellitus with mild nonproliferative diabetic retinopathy without macular edema, bilateral: Secondary | ICD-10-CM | POA: Diagnosis not present

## 2018-02-17 DIAGNOSIS — I10 Essential (primary) hypertension: Secondary | ICD-10-CM | POA: Diagnosis not present

## 2018-02-17 DIAGNOSIS — Z940 Kidney transplant status: Secondary | ICD-10-CM | POA: Diagnosis not present

## 2018-02-17 DIAGNOSIS — E1165 Type 2 diabetes mellitus with hyperglycemia: Secondary | ICD-10-CM | POA: Diagnosis not present

## 2018-02-24 DIAGNOSIS — I482 Chronic atrial fibrillation, unspecified: Secondary | ICD-10-CM | POA: Diagnosis not present

## 2018-02-24 DIAGNOSIS — Z5181 Encounter for therapeutic drug level monitoring: Secondary | ICD-10-CM | POA: Diagnosis not present

## 2018-02-24 DIAGNOSIS — Z8619 Personal history of other infectious and parasitic diseases: Secondary | ICD-10-CM | POA: Diagnosis not present

## 2018-02-24 DIAGNOSIS — G4733 Obstructive sleep apnea (adult) (pediatric): Secondary | ICD-10-CM | POA: Diagnosis not present

## 2018-02-24 DIAGNOSIS — Z6839 Body mass index (BMI) 39.0-39.9, adult: Secondary | ICD-10-CM | POA: Diagnosis not present

## 2018-02-24 DIAGNOSIS — Z7901 Long term (current) use of anticoagulants: Secondary | ICD-10-CM | POA: Diagnosis not present

## 2018-02-24 DIAGNOSIS — I272 Pulmonary hypertension, unspecified: Secondary | ICD-10-CM | POA: Diagnosis not present

## 2018-02-24 DIAGNOSIS — I5020 Unspecified systolic (congestive) heart failure: Secondary | ICD-10-CM | POA: Diagnosis not present

## 2018-02-24 DIAGNOSIS — I480 Paroxysmal atrial fibrillation: Secondary | ICD-10-CM | POA: Diagnosis not present

## 2018-02-24 DIAGNOSIS — R399 Unspecified symptoms and signs involving the genitourinary system: Secondary | ICD-10-CM | POA: Diagnosis not present

## 2018-02-24 DIAGNOSIS — E669 Obesity, unspecified: Secondary | ICD-10-CM | POA: Diagnosis not present

## 2018-02-24 DIAGNOSIS — Z951 Presence of aortocoronary bypass graft: Secondary | ICD-10-CM | POA: Diagnosis not present

## 2018-02-24 DIAGNOSIS — Z4822 Encounter for aftercare following kidney transplant: Secondary | ICD-10-CM | POA: Diagnosis not present

## 2018-02-24 DIAGNOSIS — I2510 Atherosclerotic heart disease of native coronary artery without angina pectoris: Secondary | ICD-10-CM | POA: Diagnosis not present

## 2018-02-24 DIAGNOSIS — N401 Enlarged prostate with lower urinary tract symptoms: Secondary | ICD-10-CM | POA: Diagnosis not present

## 2018-02-24 DIAGNOSIS — D509 Iron deficiency anemia, unspecified: Secondary | ICD-10-CM | POA: Diagnosis not present

## 2018-02-24 DIAGNOSIS — I129 Hypertensive chronic kidney disease with stage 1 through stage 4 chronic kidney disease, or unspecified chronic kidney disease: Secondary | ICD-10-CM | POA: Diagnosis not present

## 2018-02-24 DIAGNOSIS — N183 Chronic kidney disease, stage 3 (moderate): Secondary | ICD-10-CM | POA: Diagnosis not present

## 2018-02-24 DIAGNOSIS — Z79899 Other long term (current) drug therapy: Secondary | ICD-10-CM | POA: Diagnosis not present

## 2018-02-24 DIAGNOSIS — E662 Morbid (severe) obesity with alveolar hypoventilation: Secondary | ICD-10-CM | POA: Diagnosis not present

## 2018-02-24 DIAGNOSIS — I110 Hypertensive heart disease with heart failure: Secondary | ICD-10-CM | POA: Diagnosis not present

## 2018-02-24 DIAGNOSIS — Z792 Long term (current) use of antibiotics: Secondary | ICD-10-CM | POA: Diagnosis not present

## 2018-02-24 DIAGNOSIS — R809 Proteinuria, unspecified: Secondary | ICD-10-CM | POA: Diagnosis not present

## 2018-02-24 DIAGNOSIS — Z940 Kidney transplant status: Secondary | ICD-10-CM | POA: Diagnosis not present

## 2018-02-24 DIAGNOSIS — I5089 Other heart failure: Secondary | ICD-10-CM | POA: Diagnosis not present

## 2018-02-24 DIAGNOSIS — N400 Enlarged prostate without lower urinary tract symptoms: Secondary | ICD-10-CM | POA: Diagnosis not present

## 2018-02-25 DIAGNOSIS — Z951 Presence of aortocoronary bypass graft: Secondary | ICD-10-CM | POA: Diagnosis not present

## 2018-02-25 DIAGNOSIS — Z794 Long term (current) use of insulin: Secondary | ICD-10-CM | POA: Diagnosis not present

## 2018-02-25 DIAGNOSIS — M542 Cervicalgia: Secondary | ICD-10-CM | POA: Diagnosis not present

## 2018-02-25 DIAGNOSIS — M50322 Other cervical disc degeneration at C5-C6 level: Secondary | ICD-10-CM | POA: Diagnosis not present

## 2018-02-25 DIAGNOSIS — S0990XA Unspecified injury of head, initial encounter: Secondary | ICD-10-CM | POA: Diagnosis not present

## 2018-02-25 DIAGNOSIS — Z941 Heart transplant status: Secondary | ICD-10-CM | POA: Diagnosis not present

## 2018-02-25 DIAGNOSIS — Z7901 Long term (current) use of anticoagulants: Secondary | ICD-10-CM | POA: Diagnosis not present

## 2018-02-25 DIAGNOSIS — Z79899 Other long term (current) drug therapy: Secondary | ICD-10-CM | POA: Diagnosis not present

## 2018-02-25 DIAGNOSIS — S199XXA Unspecified injury of neck, initial encounter: Secondary | ICD-10-CM | POA: Diagnosis not present

## 2018-02-25 DIAGNOSIS — I4891 Unspecified atrial fibrillation: Secondary | ICD-10-CM | POA: Diagnosis not present

## 2018-02-25 DIAGNOSIS — R51 Headache: Secondary | ICD-10-CM | POA: Diagnosis not present

## 2018-02-25 DIAGNOSIS — M545 Low back pain: Secondary | ICD-10-CM | POA: Diagnosis not present

## 2018-02-25 DIAGNOSIS — E119 Type 2 diabetes mellitus without complications: Secondary | ICD-10-CM | POA: Diagnosis not present

## 2018-02-25 DIAGNOSIS — M47812 Spondylosis without myelopathy or radiculopathy, cervical region: Secondary | ICD-10-CM | POA: Diagnosis not present

## 2018-03-02 ENCOUNTER — Encounter: Payer: Self-pay | Admitting: Internal Medicine

## 2018-03-02 ENCOUNTER — Ambulatory Visit (INDEPENDENT_AMBULATORY_CARE_PROVIDER_SITE_OTHER): Payer: 59 | Admitting: Internal Medicine

## 2018-03-02 VITALS — BP 134/78 | HR 66 | Temp 97.5°F | Ht 71.0 in | Wt 270.0 lb

## 2018-03-02 DIAGNOSIS — M9902 Segmental and somatic dysfunction of thoracic region: Secondary | ICD-10-CM | POA: Diagnosis not present

## 2018-03-02 DIAGNOSIS — I2510 Atherosclerotic heart disease of native coronary artery without angina pectoris: Secondary | ICD-10-CM

## 2018-03-02 DIAGNOSIS — S139XXA Sprain of joints and ligaments of unspecified parts of neck, initial encounter: Secondary | ICD-10-CM | POA: Insufficient documentation

## 2018-03-02 DIAGNOSIS — M5134 Other intervertebral disc degeneration, thoracic region: Secondary | ICD-10-CM | POA: Diagnosis not present

## 2018-03-02 DIAGNOSIS — M62830 Muscle spasm of back: Secondary | ICD-10-CM | POA: Diagnosis not present

## 2018-03-02 DIAGNOSIS — M9901 Segmental and somatic dysfunction of cervical region: Secondary | ICD-10-CM | POA: Diagnosis not present

## 2018-03-02 HISTORY — DX: Sprain of joints and ligaments of unspecified parts of neck, initial encounter: S13.9XXA

## 2018-03-02 NOTE — Assessment & Plan Note (Signed)
After direct trauma in fall in bus Also upper thoracic Reviewed CT scans--nothing of concern Discussed time course---- tylenol. Cyclobenzaprine for sleep

## 2018-03-02 NOTE — Patient Instructions (Signed)
You can call Dr Daryll Brod about your hand issues.
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
## 2018-03-03 DIAGNOSIS — M9901 Segmental and somatic dysfunction of cervical region: Secondary | ICD-10-CM | POA: Diagnosis not present

## 2018-03-03 DIAGNOSIS — M5134 Other intervertebral disc degeneration, thoracic region: Secondary | ICD-10-CM | POA: Diagnosis not present

## 2018-03-03 DIAGNOSIS — M62830 Muscle spasm of back: Secondary | ICD-10-CM | POA: Diagnosis not present

## 2018-03-03 DIAGNOSIS — M9902 Segmental and somatic dysfunction of thoracic region: Secondary | ICD-10-CM | POA: Diagnosis not present

## 2018-03-04 DIAGNOSIS — Z940 Kidney transplant status: Secondary | ICD-10-CM | POA: Diagnosis not present

## 2018-03-04 DIAGNOSIS — Z4822 Encounter for aftercare following kidney transplant: Secondary | ICD-10-CM | POA: Diagnosis not present

## 2018-03-04 DIAGNOSIS — D899 Disorder involving the immune mechanism, unspecified: Secondary | ICD-10-CM | POA: Diagnosis not present

## 2018-03-04 DIAGNOSIS — Z5181 Encounter for therapeutic drug level monitoring: Secondary | ICD-10-CM | POA: Diagnosis not present

## 2018-03-04 DIAGNOSIS — Z79899 Other long term (current) drug therapy: Secondary | ICD-10-CM | POA: Diagnosis not present

## 2018-03-04 DIAGNOSIS — I10 Essential (primary) hypertension: Secondary | ICD-10-CM | POA: Diagnosis not present

## 2018-03-05 DIAGNOSIS — M9901 Segmental and somatic dysfunction of cervical region: Secondary | ICD-10-CM | POA: Diagnosis not present

## 2018-03-05 DIAGNOSIS — M62830 Muscle spasm of back: Secondary | ICD-10-CM | POA: Diagnosis not present

## 2018-03-05 DIAGNOSIS — M9902 Segmental and somatic dysfunction of thoracic region: Secondary | ICD-10-CM | POA: Diagnosis not present

## 2018-03-05 DIAGNOSIS — M5134 Other intervertebral disc degeneration, thoracic region: Secondary | ICD-10-CM | POA: Diagnosis not present

## 2018-03-06 DIAGNOSIS — M5134 Other intervertebral disc degeneration, thoracic region: Secondary | ICD-10-CM | POA: Diagnosis not present

## 2018-03-06 DIAGNOSIS — M9902 Segmental and somatic dysfunction of thoracic region: Secondary | ICD-10-CM | POA: Diagnosis not present

## 2018-03-06 DIAGNOSIS — M9901 Segmental and somatic dysfunction of cervical region: Secondary | ICD-10-CM | POA: Diagnosis not present

## 2018-03-06 DIAGNOSIS — M62830 Muscle spasm of back: Secondary | ICD-10-CM | POA: Diagnosis not present

## 2018-03-09 DIAGNOSIS — M62830 Muscle spasm of back: Secondary | ICD-10-CM | POA: Diagnosis not present

## 2018-03-09 DIAGNOSIS — M5134 Other intervertebral disc degeneration, thoracic region: Secondary | ICD-10-CM | POA: Diagnosis not present

## 2018-03-09 DIAGNOSIS — M9902 Segmental and somatic dysfunction of thoracic region: Secondary | ICD-10-CM | POA: Diagnosis not present

## 2018-03-09 DIAGNOSIS — M9901 Segmental and somatic dysfunction of cervical region: Secondary | ICD-10-CM | POA: Diagnosis not present

## 2018-03-12 DIAGNOSIS — M9901 Segmental and somatic dysfunction of cervical region: Secondary | ICD-10-CM | POA: Diagnosis not present

## 2018-03-12 DIAGNOSIS — M62830 Muscle spasm of back: Secondary | ICD-10-CM | POA: Diagnosis not present

## 2018-03-12 DIAGNOSIS — M5134 Other intervertebral disc degeneration, thoracic region: Secondary | ICD-10-CM | POA: Diagnosis not present

## 2018-03-12 DIAGNOSIS — M9902 Segmental and somatic dysfunction of thoracic region: Secondary | ICD-10-CM | POA: Diagnosis not present

## 2018-03-13 ENCOUNTER — Ambulatory Visit: Payer: 59 | Attending: Family | Admitting: Family

## 2018-03-13 ENCOUNTER — Encounter: Payer: Self-pay | Admitting: Family

## 2018-03-13 VITALS — BP 157/79 | HR 71 | Resp 18 | Ht 71.0 in | Wt 271.0 lb

## 2018-03-13 DIAGNOSIS — I110 Hypertensive heart disease with heart failure: Secondary | ICD-10-CM | POA: Diagnosis not present

## 2018-03-13 DIAGNOSIS — Z951 Presence of aortocoronary bypass graft: Secondary | ICD-10-CM | POA: Insufficient documentation

## 2018-03-13 DIAGNOSIS — Z8249 Family history of ischemic heart disease and other diseases of the circulatory system: Secondary | ICD-10-CM | POA: Insufficient documentation

## 2018-03-13 DIAGNOSIS — Z885 Allergy status to narcotic agent status: Secondary | ICD-10-CM | POA: Insufficient documentation

## 2018-03-13 DIAGNOSIS — Z841 Family history of disorders of kidney and ureter: Secondary | ICD-10-CM | POA: Insufficient documentation

## 2018-03-13 DIAGNOSIS — I5032 Chronic diastolic (congestive) heart failure: Secondary | ICD-10-CM | POA: Insufficient documentation

## 2018-03-13 DIAGNOSIS — F329 Major depressive disorder, single episode, unspecified: Secondary | ICD-10-CM | POA: Insufficient documentation

## 2018-03-13 DIAGNOSIS — E119 Type 2 diabetes mellitus without complications: Secondary | ICD-10-CM | POA: Insufficient documentation

## 2018-03-13 DIAGNOSIS — Z7901 Long term (current) use of anticoagulants: Secondary | ICD-10-CM | POA: Diagnosis not present

## 2018-03-13 DIAGNOSIS — Z79899 Other long term (current) drug therapy: Secondary | ICD-10-CM | POA: Insufficient documentation

## 2018-03-13 DIAGNOSIS — Z794 Long term (current) use of insulin: Secondary | ICD-10-CM | POA: Insufficient documentation

## 2018-03-13 DIAGNOSIS — Z940 Kidney transplant status: Secondary | ICD-10-CM | POA: Diagnosis not present

## 2018-03-13 DIAGNOSIS — J45909 Unspecified asthma, uncomplicated: Secondary | ICD-10-CM | POA: Insufficient documentation

## 2018-03-13 DIAGNOSIS — I872 Venous insufficiency (chronic) (peripheral): Secondary | ICD-10-CM | POA: Insufficient documentation

## 2018-03-13 DIAGNOSIS — E669 Obesity, unspecified: Secondary | ICD-10-CM | POA: Insufficient documentation

## 2018-03-13 DIAGNOSIS — G4733 Obstructive sleep apnea (adult) (pediatric): Secondary | ICD-10-CM | POA: Diagnosis not present

## 2018-03-13 DIAGNOSIS — E785 Hyperlipidemia, unspecified: Secondary | ICD-10-CM | POA: Diagnosis not present

## 2018-03-13 DIAGNOSIS — E1121 Type 2 diabetes mellitus with diabetic nephropathy: Secondary | ICD-10-CM

## 2018-03-13 DIAGNOSIS — Z87891 Personal history of nicotine dependence: Secondary | ICD-10-CM | POA: Diagnosis not present

## 2018-03-13 DIAGNOSIS — Z7951 Long term (current) use of inhaled steroids: Secondary | ICD-10-CM | POA: Diagnosis not present

## 2018-03-13 DIAGNOSIS — I4891 Unspecified atrial fibrillation: Secondary | ICD-10-CM | POA: Insufficient documentation

## 2018-03-13 DIAGNOSIS — G3184 Mild cognitive impairment, so stated: Secondary | ICD-10-CM | POA: Insufficient documentation

## 2018-03-13 DIAGNOSIS — Z6837 Body mass index (BMI) 37.0-37.9, adult: Secondary | ICD-10-CM | POA: Insufficient documentation

## 2018-03-13 DIAGNOSIS — I10 Essential (primary) hypertension: Secondary | ICD-10-CM

## 2018-03-13 DIAGNOSIS — I2510 Atherosclerotic heart disease of native coronary artery without angina pectoris: Secondary | ICD-10-CM | POA: Diagnosis not present

## 2018-03-13 DIAGNOSIS — I509 Heart failure, unspecified: Secondary | ICD-10-CM | POA: Diagnosis present

## 2018-03-13 NOTE — Patient Instructions (Signed)
Continue weighing daily and call for an overnight weight gain of > 2 pounds or a weekly weight gain of >5 pounds. 
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
## 2018-03-15 ENCOUNTER — Encounter: Payer: Self-pay | Admitting: Family

## 2018-03-16 DIAGNOSIS — M9902 Segmental and somatic dysfunction of thoracic region: Secondary | ICD-10-CM | POA: Diagnosis not present

## 2018-03-16 DIAGNOSIS — M5134 Other intervertebral disc degeneration, thoracic region: Secondary | ICD-10-CM | POA: Diagnosis not present

## 2018-03-16 DIAGNOSIS — M62830 Muscle spasm of back: Secondary | ICD-10-CM | POA: Diagnosis not present

## 2018-03-16 DIAGNOSIS — M9901 Segmental and somatic dysfunction of cervical region: Secondary | ICD-10-CM | POA: Diagnosis not present

## 2018-03-19 ENCOUNTER — Other Ambulatory Visit: Payer: Self-pay | Admitting: Cardiovascular Disease

## 2018-03-20 NOTE — Telephone Encounter (Signed)
Please advise if ok to refill Crestor.

## 2018-03-26 ENCOUNTER — Other Ambulatory Visit: Payer: Self-pay | Admitting: Cardiovascular Disease

## 2018-03-27 DIAGNOSIS — G4733 Obstructive sleep apnea (adult) (pediatric): Secondary | ICD-10-CM | POA: Diagnosis not present

## 2018-03-27 DIAGNOSIS — I482 Chronic atrial fibrillation, unspecified: Secondary | ICD-10-CM | POA: Diagnosis not present

## 2018-03-27 DIAGNOSIS — E662 Morbid (severe) obesity with alveolar hypoventilation: Secondary | ICD-10-CM | POA: Diagnosis not present

## 2018-03-30 ENCOUNTER — Ambulatory Visit (INDEPENDENT_AMBULATORY_CARE_PROVIDER_SITE_OTHER): Payer: 59 | Admitting: Internal Medicine

## 2018-03-30 ENCOUNTER — Encounter: Payer: Self-pay | Admitting: Internal Medicine

## 2018-03-30 ENCOUNTER — Ambulatory Visit: Payer: 59 | Admitting: Internal Medicine

## 2018-03-30 VITALS — HR 65 | Temp 97.7°F | Ht 71.0 in | Wt 268.0 lb

## 2018-03-30 DIAGNOSIS — M10021 Idiopathic gout, right elbow: Secondary | ICD-10-CM

## 2018-03-30 DIAGNOSIS — M109 Gout, unspecified: Secondary | ICD-10-CM

## 2018-03-30 HISTORY — DX: Gout, unspecified: M10.9

## 2018-03-30 MED ORDER — COLCHICINE 0.6 MG PO TABS: 0.6000 mg | ORAL_TABLET | Freq: Two times a day (BID) | ORAL | 1 refills | Status: DC | PRN

## 2018-03-30 MED ORDER — METHYLPREDNISOLONE ACETATE 80 MG/ML IJ SUSP
80.0000 mg | Freq: Once | INTRAMUSCULAR | Status: AC
  Administered 2018-03-30: 80 mg via INTRAMUSCULAR

## 2018-03-30 NOTE — Assessment & Plan Note (Signed)
Clearly doesn't seem to be infected and the severity of the pain is classic for gout Will give depomedrol 15m IM Rx for colchicine (he had but then it expired)

## 2018-03-30 NOTE — Addendum Note (Signed)
Addended by: Pilar Grammes on: 03/30/2018 03:09 PM   Modules accepted: Orders

## 2018-03-30 NOTE — Progress Notes (Signed)
Subjective:    Patient ID: [name], male    DOB: 1960/04/02, 58 y.o.   MRN: [id number]  HPI Here with wife due to right elbow pain Started yesterday---just faint at first in the morning and quickly built up Tylenol not touching it Splinting/holding his arm  Hot and inflamed Terrible pain just putting a sheet on it Similar to gout pain---but never had in elbow before  Current Outpatient Medications on File Prior to Visit  Medication Sig Dispense Refill  . albuterol (PROVENTIL HFA;VENTOLIN HFA) 108 (90 Base) MCG/ACT inhaler Inhale 2 puffs every 6 (six) hours as needed into the lungs for wheezing or shortness of breath. 1 Inhaler 2  . AMBULATORY NON FORMULARY MEDICATION Medication Name: Nebulizer 1 each 0  . amLODipine (NORVASC) 5 MG tablet Take 1 tablet (5 mg total) by mouth daily. 90 tablet 3  . apixaban (ELIQUIS) 5 MG TABS tablet Take 1 tablet (5 mg total) by mouth 2 (two) times daily. 180 tablet 3  . beclomethasone (QVAR) 80 MCG/ACT inhaler Inhale 2 puffs into the lungs 2 (two) times daily. 1 Inhaler 6  . buPROPion (WELLBUTRIN) 75 MG tablet TAKE 1 TABLET BY MOUTH TWICE DAILY 180 tablet 3  . furosemide (LASIX) 40 MG tablet TAKE 1 TABLET BY MOUTH 2 TIMES DAILY. TAKE AN ADDITIONAL 80MG IN THE PM AS NEEDED FOR WEIGHT GAIN. 240 tablet 3  . insulin aspart (NOVOLOG) 100 UNIT/ML injection Inject 1-5 Units into the skin once. Using as sliding scale. Takes 1 unit if is 10 above normal, 5 units if is 50 above normal. Upper limit of normal for him is 120.    Marland Kitchen insulin glargine (LANTUS) 100 unit/mL SOPN Inject 0.1 mLs (10 Units total) into the skin at bedtime. Currently using 15 units (Patient taking differently: Inject 10 Units into the skin at bedtime. Currently using 10 units) 15 mL 11  . lisinopril (PRINIVIL,ZESTRIL) 40 MG tablet TAKE 1 TABLET BY MOUTH DAILY 30 tablet 2  . magnesium 30 MG tablet Take 30 mg by mouth daily.    . metolazone (ZAROXOLYN) 5 MG tablet TAKE 1 TABLET BY MOUTH AS  NEEDED 30 tablet 3  . mometasone (NASONEX) 50 MCG/ACT nasal spray PLACE 2 SPRAYS INTO THE NOSE AT BEDTIME. 17 g 11  . montelukast (SINGULAIR) 10 MG tablet TAKE 1 TABLET BY MOUTH AT BEDTIME. 30 tablet 5  . Multiple Vitamin (MULTIVITAMIN WITH MINERALS) TABS tablet Take 1 tablet by mouth daily.    . mycophenolate (MYFORTIC) 180 MG EC tablet Take 360 mg by mouth 2 (two) times daily.     Marland Kitchen omeprazole (PRILOSEC) 20 MG capsule TAKE 1 CAPSULE BY MOUTH ONCE DAILY 90 capsule 3  . potassium chloride (MICRO-K) 10 MEQ CR capsule Take 2 capsules (20 mEq total) by mouth 2 (two) times daily. (Patient taking differently: Take 30 mEq by mouth 2 (two) times daily. ) 360 capsule 4  . rosuvastatin (CRESTOR) 10 MG tablet TAKE 1 TABLET BY MOUTH DAILY 90 tablet 0  . sildenafil (VIAGRA) 100 MG tablet TAKE 0.5-1 TABLETS (50-100 MG TOTAL) BY MOUTH DAILY AS NEEDED FOR ERECTILE DYSFUNCTION. 10 tablet 11  . tacrolimus (PROGRAF) 0.5 MG capsule Take 1 mg by mouth 2 (two) times daily.    . tamsulosin (FLOMAX) 0.4 MG CAPS capsule Take 0.4 mg by mouth.    . spironolactone (ALDACTONE) 25 MG tablet Take 1 tablet (25 mg total) by mouth daily. 30 tablet 11   Current Facility-Administered Medications on File Prior to  Visit  Medication Dose Route Frequency Provider Last Rate Last Dose  . ipratropium-albuterol (DUONEB) 0.5-2.5 (3) MG/3ML nebulizer solution 3 mL  3 mL Nebulization Q6H Laverle Hobby, MD   3 mL at 08/13/17 0956    Allergies  Allergen Reactions  . Morphine Itching    Past Medical History:  Diagnosis Date  . Allergy    seasonal  . Amnestic MCI (mild cognitive impairment with memory loss)   . Asthma   . Atrial fibrillation ([place])   . Atrial fibrillation ([place]) 2016  . Benign neoplasm of colon   . CAD (coronary artery disease) 2005   had CABG  . CHF (congestive heart failure) ([place])   . Chronic venous stasis dermatitis of both lower extremities   . Cytomegaloviral disease ([place]) 2017  . Depression   .  Diabetes mellitus   . Diverticulosis   . Dyspnea   . Erectile dysfunction   . ESRD (end stage renal disease) ([place]) 2005   Dialysis then renal transplant  . GERD (gastroesophageal reflux disease)   . Gout   . Hyperlipidemia    low since renal failure  . Hypertension   . Kidney transplant status, cadaveric 2012  . Obesity   . Pneumonia   . Purpura ([place])   . Sleep apnea    bipap with oxygen 2 l  . Trigger finger   . Ulcer 05/2016   Left shin  . Wears glasses     Past Surgical History:  Procedure Laterality Date  . BARIATRIC SURGERY    . CARDIAC CATHETERIZATION     [facility]  . CATARACT EXTRACTION W/PHACO Right 10/29/2017   Procedure: CATARACT EXTRACTION PHACO AND INTRAOCULAR LENS PLACEMENT ([place])  RIGHT DIABETIC;  Surgeon: [name], MD;  Location: [place];  Service: Ophthalmology;  Laterality: Right;  Diabetic - insulin  . CATARACT EXTRACTION W/PHACO Left 11/18/2017   Procedure: CATARACT EXTRACTION PHACO AND INTRAOCULAR LENS PLACEMENT ([place]) LEFT IVA/TOPICAL;  Surgeon: [name], MD;  Location: [place];  Service: Ophthalmology;  Laterality: Left;  DIABETES - insulin sleep apnea  . COLONOSCOPY WITH PROPOFOL N/A 04/22/2013   Procedure: COLONOSCOPY WITH PROPOFOL;  Surgeon: [name], MD;  [facility];  Service: Endoscopy;  Laterality: N/A;  . CORONARY ARTERY BYPASS GRAFT  2005   X 4  . DG ANGIO AV SHUNT*L*     right and left upper arms  . FASCIOTOMY  03/03/2012   Procedure: FASCIOTOMY;  Surgeon: [name], MD;  Location: [place];  Service: Orthopedics;  Laterality: Right;  FASCIOTOMY RIGHT SMALL FINGER  . FASCIOTOMY Left 08/17/2013   Procedure: FASCIOTOMY LEFT RING;  Surgeon: [name], MD;  Location: [place];  Service: Orthopedics;  Laterality: Left;  . INCISION AND DRAINAGE ABSCESS Left 10/15/2015   Procedure: INCISION AND DRAINAGE ABSCESS;  Surgeon: [name], MD;  Location: [facility];  Service: General;  Laterality: Left;  . KIDNEY TRANSPLANT  09/13/2010   cadaver--at [facility]  . RIGHT HEART CATH N/A 11/15/2016   Procedure: RIGHT HEART CATH;  Surgeon: [name], MD;  Location: [facility];  Service: Cardiovascular;  Laterality: N/A;  . TYMPANIC MEMBRANE REPAIR  1/12   left  . VASECTOMY      Family History  Problem Relation Age of Onset  . Heart disease Father   . Kidney failure Father   . Kidney disease Father   . Diabetes Maternal Grandmother   . Breast cancer Maternal  Grandmother   . Valvular heart disease Mother   . Liver cancer Paternal Uncle   . Liver cancer Paternal Grandmother   . Prostate cancer Neg Hx     Social History   Socioeconomic History  . Marital status: Married    Spouse name: Not on file  . Number of children: 2  . Years of education: Not on file  . Highest education level: Not on file  Occupational History  . Occupation: Academic librarian: unemployed    Comment: disabled due to kidney failure  Social Needs  . Financial resource strain: Not on file  . Food insecurity:    Worry: Not on file    Inability: Not on file  . Transportation needs:    Medical: Not on file    Non-medical: Not on file  Tobacco Use  . Smoking status: Former Smoker    Types: Cigars    Last attempt to quit: 09/02/1994    Years since quitting: 23.5  . Smokeless tobacco: Never Used  Substance and Sexual Activity  . Alcohol use: Yes    Alcohol/week: 0.0 - 1.0 standard drinks    Comment: occasional- 3 in the past year  . Drug use: No  . Sexual activity: Not on file  Lifestyle  . Physical activity:    Days per week: Not on file    Minutes per session: Not on file  . Stress: Not on file  Relationships  . Social connections:    Talks on phone: Not on file    Gets together: Not on file    Attends religious service: Not on file    Active member of club or organization: Not on file    Attends meetings of clubs or  organizations: Not on file    Relationship status: Not on file  . Intimate partner violence:    Fear of current or ex partner: Not on file    Emotionally abused: Not on file    Physically abused: Not on file    Forced sexual activity: Not on file  Other Topics Concern  . Not on file  Social History Narrative   In school for culinary arts--- will finish 12/16      Has living will   Wife is health care POA   Would accept resuscitation attempts   Hasn't considered tube feedings   Review of Systems No fever No injury     Objective:   Physical Exam  Constitutional:  Suffering in distress holding right elbow  Musculoskeletal:     Comments: Hard even to pull up his sleeve Exquisitely sensitive No major redness but tender right over the bursa especially           Assessment & Plan:

## 2018-04-01 NOTE — Progress Notes (Signed)
Cardiology Office Note Date:  04/02/2018  Patient ID:  [name] Feb 22, 1961, MRN [id number] PCP:  [name], MD  Cardiologist:  [name], MD    Chief Complaint: Follow-up  History of Present Illness: [name] is a 58 y.o. male with history of CAD status post CABG in 2004, chronic combined systolic and diastolic CHF secondary to ICM with prior EF of 35 to 40%, chronic A. fib on Eliquis, pulmonary hypertension, ESRD previously on hemodialysis from 2004 through 2012 status post kidney transplant in 2012, anemia of chronic disease, DM2, HTN, HLD, beta-blocker induced bradycardia, obesity status post gastric bypass surgery in 04/252 without complications, and sleep apnea who presents for follow-up of his CAD, CHF, and A. fib.  Patient underwent four-vessel bypass in 2005 without any ischemic events since.  Patient was noted to have worsening shortness of breath with right-sided heart failure in the summer 2018.  Echo in 09/2016 was an overall difficult study.  EF was noted to be improved at 45-50%, moderately dilated left atrium, RV systolic function normal, PASP normal.  He was admitted in 10/2016 requiring IV diuresis.  RHC showed a severely elevated filling pressures, severe pulmonary hypertension and low normal cardiac output after several days of diuresis.  His pulmonary wedge pressure was 31 mmHg.  He was diuresed with a Lasix drip.  His weight at time of discharge was 277 pounds.  Since then, he has been following with the [facility] CHF clinic.  When he was last seen in our office in 03/2017, his weight remained around 265 to 275 pounds at home.  He was taking Lasix 40 mg twice daily with metolazone as needed.  He was most recently seen by the [facility] CHF clinic on 03/13/2018 with a BP of 157/79, weight 271 pounds.  No changes were made.  Labs: 11/2017 - potassium 4.7, glucose 172, BUN 36, serum creatinine 1.23 (baseline approximately 1.1-1.2) 09/2017 - LDL 49, A1c  7.5  Patient comes in today noting a 22-monthhistory of increased fatigue and shortness of breath.  He indicates he becomes quite short of breath after walking up a flight of stairs and is now becoming more short of breath when walking on flat ground.  He reports his weight fluctuates between 265 and 270 pounds.  He has been compliant with a low-sodium diet and drinks less than 2 L of fluids daily.  He is compliant with all medications.  Over the past several months he has needed to take an additional 80 mg of Lasix 1 time.  Since 02/22/2018 he has taken PRN metolazone twice.  [facility].  He notes stable, mild lower extremity swelling.  No orthopnea or PND.  He notes his appetite is not as vigorous as it previously was.  During the fall 2019 he was hiking in the mountains and noted some exertional chest tightness that improved with resting for approximately 5 to 10 minutes.  Following this rest, he was able to continue his hike up the mountain without any symptoms.  Since then, he has not had any exertional chest tightness though has noted intermittent tightness that is not related to exertion.  He does not describe this as a pain.  He is uncertain if this feels similar to how he felt leading up to his bypass as he states he was without pain prior to his bypass.  He is currently  without chest pain/tightness/or shortness of breath.  Past Medical History:  Diagnosis Date  . Allergy    seasonal  . Amnestic MCI (mild cognitive impairment with memory loss)   . Asthma   . Benign neoplasm of colon   . CAD (coronary artery disease) 2005   a.  Status post four-vessel CABG in 2005  . Chronic atrial fibrillation    a. CHADS2VASc 4 (CHF, HTN, DM, vascular disease); b. Eliquis  . Chronic combined systolic and diastolic CHF (congestive heart failure) ([place])    a.  TTE 7/18: EF 45-50%, moderately dilated  LA, RVSF normal, PASP normal  . Chronic venous stasis dermatitis of both lower extremities   . Cytomegaloviral disease ([place]) 2017  . Depression   . Diabetes mellitus   . Diverticulosis   . Dyspnea   . Erectile dysfunction   . ESRD (end stage renal disease) ([place]) 2005   a. HD 2004-2012; b. S/p renal transplant in 2012  . GERD (gastroesophageal reflux disease)   . Gout   . Hyperlipidemia    low since renal failure  . Hypertension   . Kidney transplant status, cadaveric 2012  . Obesity   . Pneumonia   . Purpura ([place])   . Sleep apnea    bipap with oxygen 2 l  . Trigger finger   . Ulcer 05/2016   Left shin  . Wears glasses     Past Surgical History:  Procedure Laterality Date  . BARIATRIC SURGERY    . CARDIAC CATHETERIZATION     [facility]  . CATARACT EXTRACTION W/PHACO Right 10/29/2017   Procedure: CATARACT EXTRACTION PHACO AND INTRAOCULAR LENS PLACEMENT ([place])  RIGHT DIABETIC;  Surgeon: [name], MD;  Location: [place];  Service: Ophthalmology;  Laterality: Right;  Diabetic - insulin  . CATARACT EXTRACTION W/PHACO Left 11/18/2017   Procedure: CATARACT EXTRACTION PHACO AND INTRAOCULAR LENS PLACEMENT ([place]) LEFT IVA/TOPICAL;  Surgeon: [name], MD;  Location: [place];  Service: Ophthalmology;  Laterality: Left;  DIABETES - insulin sleep apnea  . COLONOSCOPY WITH PROPOFOL N/A 04/22/2013   Procedure: COLONOSCOPY WITH PROPOFOL;  Surgeon: [name], MD;  [facility];  Service: Endoscopy;  Laterality: N/A;  . CORONARY ARTERY BYPASS GRAFT  2005   X 4  . DG ANGIO AV SHUNT*L*     right and left upper arms  . FASCIOTOMY  03/03/2012   Procedure: FASCIOTOMY;  Surgeon: [name], MD;  Location: [place];  Service: Orthopedics;  Laterality: Right;  FASCIOTOMY RIGHT SMALL FINGER  . FASCIOTOMY Left 08/17/2013   Procedure: FASCIOTOMY LEFT RING;  Surgeon: [name], MD;  Location: [place];  Service:  Orthopedics;  Laterality: Left;  . INCISION AND DRAINAGE ABSCESS Left 10/15/2015   Procedure: INCISION AND DRAINAGE ABSCESS;  Surgeon: [name], MD;  Location: [facility];  Service: General;  Laterality: Left;  . KIDNEY TRANSPLANT  09/13/2010   cadaver--at [facility]  . RIGHT HEART CATH N/A 11/15/2016   Procedure: RIGHT HEART CATH;  Surgeon: [name], MD;  Location: [place] [facility];  Service: Cardiovascular;  Laterality: N/A;  . TYMPANIC MEMBRANE REPAIR  1/12   left  . VASECTOMY      Current Meds  Medication Sig  . albuterol (PROVENTIL HFA;VENTOLIN HFA) 108 (90 Base) MCG/ACT inhaler Inhale 2 puffs every 6 (six) hours as needed into the lungs for wheezing or shortness of breath.  . AMBULATORY NON FORMULARY MEDICATION Medication Name: Nebulizer  .  amLODipine (NORVASC) 5 MG tablet Take 1 tablet (5 mg total) by mouth daily.  Marland Kitchen apixaban (ELIQUIS) 5 MG TABS tablet Take 1 tablet (5 mg total) by mouth 2 (two) times daily.  . beclomethasone (QVAR) 80 MCG/ACT inhaler Inhale 2 puffs into the lungs 2 (two) times daily. (Patient taking differently: Inhale 2 puffs into the lungs 2 (two) times daily as needed. )  . buPROPion (WELLBUTRIN) 75 MG tablet TAKE 1 TABLET BY MOUTH TWICE DAILY  . colchicine 0.6 MG tablet Take 1 tablet (0.6 mg total) by mouth 2 (two) times daily as needed.  . furosemide (LASIX) 40 MG tablet TAKE 1 TABLET BY MOUTH 2 TIMES DAILY. TAKE AN ADDITIONAL 80MG IN THE PM AS NEEDED FOR WEIGHT GAIN.  Marland Kitchen insulin aspart (NOVOLOG) 100 UNIT/ML injection Inject 1-5 Units into the skin once. Using as sliding scale. Takes 1 unit if is 10 above normal, 5 units if is 50 above normal. Upper limit of normal for him is 120.  Marland Kitchen insulin glargine (LANTUS) 100 unit/mL SOPN Inject 0.1 mLs (10 Units total) into the skin at bedtime. Currently using 15 units (Patient taking differently: Inject 10 Units into the skin at bedtime. Currently using 10 units)  . lisinopril (PRINIVIL,ZESTRIL) 40 MG tablet TAKE 1  TABLET BY MOUTH DAILY  . magnesium 30 MG tablet Take 30 mg by mouth daily.  . metolazone (ZAROXOLYN) 5 MG tablet TAKE 1 TABLET BY MOUTH AS NEEDED  . mometasone (NASONEX) 50 MCG/ACT nasal spray PLACE 2 SPRAYS INTO THE NOSE AT BEDTIME.  . montelukast (SINGULAIR) 10 MG tablet TAKE 1 TABLET BY MOUTH AT BEDTIME.  . Multiple Vitamin (MULTIVITAMIN WITH MINERALS) TABS tablet Take 1 tablet by mouth daily.  . mycophenolate (MYFORTIC) 180 MG EC tablet Take 360 mg by mouth 2 (two) times daily.   Marland Kitchen omeprazole (PRILOSEC) 20 MG capsule TAKE 1 CAPSULE BY MOUTH ONCE DAILY  . potassium chloride (MICRO-K) 10 MEQ CR capsule Take 2 capsules (20 mEq total) by mouth 2 (two) times daily. (Patient taking differently: Take 30 mEq by mouth 2 (two) times daily. )  . rosuvastatin (CRESTOR) 10 MG tablet TAKE 1 TABLET BY MOUTH DAILY  . sildenafil (VIAGRA) 100 MG tablet TAKE 0.5-1 TABLETS (50-100 MG TOTAL) BY MOUTH DAILY AS NEEDED FOR ERECTILE DYSFUNCTION.  Marland Kitchen tacrolimus (PROGRAF) 0.5 MG capsule Take 1 mg by mouth 2 (two) times daily.  . tamsulosin (FLOMAX) 0.4 MG CAPS capsule Take 0.4 mg by mouth daily.    Current Facility-Administered Medications for the 04/02/18 encounter (Office Visit) with [name], PA-C  Medication  . ipratropium-albuterol (DUONEB) 0.5-2.5 (3) MG/3ML nebulizer solution 3 mL    Allergies:   Morphine   Social History:  The patient  reports that he quit smoking about 23 years ago. His smoking use included cigars. He has never used smokeless tobacco. He reports current alcohol use. He reports that he does not use drugs.   Family History:  The patient's family history includes Breast cancer in his maternal grandmother; Diabetes in his maternal grandmother; Heart disease in his father; Kidney disease in his father; Kidney failure in his father; Liver cancer in his paternal grandmother and paternal uncle; Valvular heart disease in his mother.  ROS:   Review of Systems  Constitutional: Positive for  malaise/fatigue. Negative for chills, diaphoresis, fever and weight loss.  HENT: Negative for congestion.   Eyes: Negative for discharge and redness.  Respiratory: Positive for cough and shortness of breath. Negative for hemoptysis, sputum production  and wheezing.   Cardiovascular: Positive for chest pain, palpitations and leg swelling. Negative for orthopnea, claudication and PND.  Gastrointestinal: Negative for abdominal pain, blood in stool, heartburn, melena, nausea and vomiting.  Genitourinary: Negative for hematuria.  Musculoskeletal: Negative for falls and myalgias.  Skin: Negative for rash.  Neurological: Positive for dizziness and weakness. Negative for tingling, tremors, sensory change, speech change, focal weakness and loss of consciousness.  Endo/Heme/Allergies: Does not bruise/bleed easily.  Psychiatric/Behavioral: Negative for substance abuse. The patient is not nervous/anxious.   All other systems reviewed and are negative.    PHYSICAL EXAM:  VS:  BP 130/70 (BP Location: Right Arm, Patient Position: Sitting, Cuff Size: Large)   Pulse (!) 55   Ht _0  (1.803 m)   Wt 267 lb 12 oz (121.5 kg)   BMI 37.34 kg/m  BMI: Body mass index is 37.34 kg/m.  Physical Exam  Constitutional: He is oriented to person, place, and time. He appears well-developed and well-nourished.  HENT:  Head: Normocephalic and atraumatic.  Eyes: Right eye exhibits no discharge. Left eye exhibits no discharge.  Neck: Normal range of motion. No JVD present.  Cardiovascular: S1 normal, S2 normal and normal heart sounds. An irregularly irregular rhythm present. Bradycardia present. Exam reveals no distant heart sounds, no friction rub, no midsystolic click and no opening snap.  No murmur heard. Pulses:      Posterior tibial pulses are 2+ on the right side and 2+ on the left side.  Pulmonary/Chest: Effort normal and breath sounds normal. No respiratory distress. He has no decreased breath sounds. He has no  wheezes. He has no rales. He exhibits no tenderness.  Abdominal: Soft. He exhibits no distension. There is no abdominal tenderness.  Musculoskeletal:        General: Edema present.     Comments: Trace bilateral pretibial edema  Neurological: He is alert and oriented to person, place, and time.  Skin: Skin is warm and dry. No cyanosis. Nails show no clubbing.  Psychiatric: He has a normal mood and affect. His speech is normal and behavior is normal. Judgment and thought content normal.     EKG:  Was ordered and interpreted by me today. Shows A. fib with slow ventricular response, 55 bpm, left axis deviation, nonspecific IVCD (unchanged from prior)  Recent Labs: 08/13/2017: Hemoglobin 10.9; Platelets 126 08/22/2017: Magnesium 2.1 12/12/2017: BUN 36; Creatinine, Ser 1.23; Potassium 4.7; Sodium 138  10/09/2017: Cholesterol 89; LDL Cholesterol 49   CrCl cannot be calculated (Patient's most recent lab result is older than the maximum 21 days allowed.).   Wt Readings from Last 3 Encounters:  04/02/18 267 lb 12 oz (121.5 kg)  03/30/18 268 lb (121.6 kg)  03/13/18 271 lb (122.9 kg)     Other studies reviewed: Additional studies/records reviewed today include: summarized above  ASSESSMENT AND PLAN:  1. CAD status post CABG with chest tightness: Currently chest pain/tightness free.  Schedule Lexiscan Myoview to evaluate for high risk ischemia.  On Eliquis in place of aspirin.  Continue Crestor as below.  Not on beta-blocker secondary to prior beta-blocker induced bradycardia.  Aggressive risk factor modification and secondary prevention.  2. Exertional dyspnea/fatigue: Schedule echocardiogram and Lexiscan Myoview.  Check CBC.  Cannot exclude the patient's morbid obesity and physical deconditioning.  3. Chronic systolic CHF: He appears well compensated and euvolemic on exam today.  He does have trace bilateral lower extremity swelling which he reports is at his baseline.  This certainly could be  related  to venous insufficiency and calcium channel blocker usage.  Renal function has been stable on recent check.  Continue Lasix 40 mg daily and PRN metolazone.  He is not on a beta-blocker secondary to prior beta-blocker induced bradycardia on carvedilol.  Remains on lisinopril and spironolactone.  Check echocardiogram as above.  4. Chronic atrial fibrillation: Bradycardic rate of 55 bpm not on rate limiting medications.  Continue Eliquis.  5. Hyperlipidemia: Recent LDL of 49 from 09/2017.  Remains on Crestor.  6. Hypertension: Blood pressure is reasonably controlled today.  Continue Norvasc, Lasix, lisinopril, and spironolactone.  7. ESRD status post renal transplant: Followed [facility]rology at [facility] with stable renal function on recent labs.  Disposition: F/u with [name] or an APP in 1 month.   Current medicines are reviewed at length with the patient today.  The patient did not have any concerns regarding medicines.  Signed, Christell Faith, PA-C 04/02/2018 8:16 AM     [facility] [address] [place] [address] [place], [place] [address] [phone]

## 2018-04-02 ENCOUNTER — Encounter: Payer: Self-pay | Admitting: Physician Assistant

## 2018-04-02 ENCOUNTER — Ambulatory Visit (INDEPENDENT_AMBULATORY_CARE_PROVIDER_SITE_OTHER): Payer: 59 | Admitting: Physician Assistant

## 2018-04-02 VITALS — BP 130/70 | HR 55 | Ht 71.0 in | Wt 267.8 lb

## 2018-04-02 DIAGNOSIS — I10 Essential (primary) hypertension: Secondary | ICD-10-CM

## 2018-04-02 DIAGNOSIS — I482 Chronic atrial fibrillation, unspecified: Secondary | ICD-10-CM | POA: Diagnosis not present

## 2018-04-02 DIAGNOSIS — R0609 Other forms of dyspnea: Secondary | ICD-10-CM

## 2018-04-02 DIAGNOSIS — N186 End stage renal disease: Secondary | ICD-10-CM

## 2018-04-02 DIAGNOSIS — R5383 Other fatigue: Secondary | ICD-10-CM | POA: Diagnosis not present

## 2018-04-02 DIAGNOSIS — Z940 Kidney transplant status: Secondary | ICD-10-CM

## 2018-04-02 DIAGNOSIS — R0600 Dyspnea, unspecified: Secondary | ICD-10-CM

## 2018-04-02 DIAGNOSIS — I5032 Chronic diastolic (congestive) heart failure: Secondary | ICD-10-CM

## 2018-04-02 DIAGNOSIS — R0789 Other chest pain: Secondary | ICD-10-CM | POA: Diagnosis not present

## 2018-04-02 DIAGNOSIS — I5022 Chronic systolic (congestive) heart failure: Secondary | ICD-10-CM

## 2018-04-02 DIAGNOSIS — R0602 Shortness of breath: Secondary | ICD-10-CM | POA: Diagnosis not present

## 2018-04-02 DIAGNOSIS — I2510 Atherosclerotic heart disease of native coronary artery without angina pectoris: Secondary | ICD-10-CM

## 2018-04-02 DIAGNOSIS — E785 Hyperlipidemia, unspecified: Secondary | ICD-10-CM | POA: Diagnosis not present

## 2018-04-02 NOTE — Patient Instructions (Addendum)
Medication Instructions:  Your physician recommends that you continue on your current medications as directed. Please refer to the Current Medication list given to you today.  If you need a refill on your cardiac medications before your next appointment, please call your pharmacy.   Lab work: Your physician recommends that you return for lab work today (CBC)  If you have labs (blood work) drawn today and your tests are completely normal, you will receive your results only by: Marland Kitchen MyChart Message (if you have MyChart) OR . A paper copy in the mail If you have any lab test that is abnormal or we need to change your treatment, we will call you to review the results.  Testing/Procedures: 1- Echo Your physician has requested that you have an echocardiogram. Echocardiography is a painless test that uses sound waves to create images of your heart. It provides your doctor with information about the size and shape of your heart and how well your heart's chambers and valves are working. This procedure takes approximately one hour. There are no restrictions for this procedure.  2- Lexi [facility]  Your caregiver has ordered a Stress Test with nuclear imaging. The purpose of this test is to evaluate the blood supply to your heart muscle. This procedure is referred to as a "Non-Invasive Stress Test." This is because other than having an IV started in your vein, nothing is inserted or "invades" your body. Cardiac stress tests are done to find areas of poor blood flow to the heart by determining the extent of coronary artery disease (CAD). Some patients exercise on a treadmill, which naturally increases the blood flow to your heart, while others who are  unable to walk on a treadmill due to physical limitations have a pharmacologic/chemical stress agent called Lexiscan . This medicine will mimic walking on a treadmill by temporarily increasing your coronary blood flow.   Please note: these test may take  anywhere between 2-4 hours to complete  PLEASE REPORT TO [facility] AT THE FIRST DESK WILL DIRECT YOU WHERE TO GO  Date of Procedure:_____________________________________  Arrival Time for Procedure:______________________________  Instructions regarding medication:   __x__ : Hold diabetes medication/insulin morning of procedure, take 1/2 dose long acting insulin the night prior ____:  Hold other medications as follows:_____Lasix (fluid pill)______________  PLEASE NOTIFY THE OFFICE AT LEAST 24 HOURS IN ADVANCE IF YOU ARE UNABLE TO KEEP YOUR APPOINTMENT.  [phone] AND  PLEASE NOTIFY NUCLEAR MEDICINE AT [facility] AT LEAST 24 HOURS IN ADVANCE IF YOU ARE UNABLE TO KEEP YOUR APPOINTMENT. [phone]  How to prepare for your Myoview test:  1. Do not eat or drink after midnight 2. No caffeine for 24 hours prior to test 3. No smoking 24 hours prior to test. 4. Your medication may be taken with water.  If your doctor stopped a medication because of this test, do not take that medication. 5. Ladies, please do not wear dresses.  Skirts or pants are appropriate. Please wear a short sleeve shirt. 6. No perfume, cologne or lotion. 7. Wear comfortable walking shoes. No heels!   Follow-Up: At [facility], you and your health needs are our priority.  As part of our continuing mission to provide you with exceptional heart care, we have created designated Provider Care Teams.  These Care Teams include your primary Cardiologist (physician) and Advanced Practice Providers (APPs -  Physician Assistants and Nurse Practitioners) who all work together to provide you with the care you  need, when you need it. You will need a follow up appointment in 1 months.  You may see [name] or one of the following Advanced Practice Providers on your designated Care Team:   Murray Hodgkins, NP Christell Faith, PA-C . Marrianne Mood, PA-C

## 2018-04-03 ENCOUNTER — Telehealth: Payer: Self-pay

## 2018-04-03 LAB — CBC
HEMOGLOBIN: 11 g/dL — AB (ref 13.0–17.7)
Hematocrit: 34.5 % — ABNORMAL LOW (ref 37.5–51.0)
MCH: 23.5 pg — AB (ref 26.6–33.0)
MCHC: 31.9 g/dL (ref 31.5–35.7)
MCV: 74 fL — AB (ref 79–97)
Platelets: 188 10*3/uL (ref 150–450)
RBC: 4.68 x10E6/uL (ref 4.14–5.80)
RDW: 14.8 % (ref 11.6–15.4)
WBC: 5 10*3/uL (ref 3.4–10.8)

## 2018-04-03 NOTE — Telephone Encounter (Signed)
Call attempted. LMTCB

## 2018-04-03 NOTE — Telephone Encounter (Signed)
-----  Message from [name], PA-C sent at 04/03/2018  9:27 AM EST ----- HGB low, though stable. Follow up with PCP as directed given anemia.

## 2018-04-03 NOTE — Telephone Encounter (Signed)
Left detailed message regarding labs as requested by patient. Advised pt to call for any further questions or concerns

## 2018-04-03 NOTE — Telephone Encounter (Signed)
Patient returning call for results - says if unavailable ok to leave a detailed message

## 2018-04-10 ENCOUNTER — Encounter
Admission: RE | Admit: 2018-04-10 | Discharge: 2018-04-10 | Disposition: A | Discharge status: Home / Self Care | Payer: 59 | Source: Ambulatory Visit | Attending: Physician Assistant | Admitting: Physician Assistant

## 2018-04-10 DIAGNOSIS — R0609 Other forms of dyspnea: Secondary | ICD-10-CM | POA: Diagnosis not present

## 2018-04-10 DIAGNOSIS — R0600 Dyspnea, unspecified: Secondary | ICD-10-CM

## 2018-04-10 DIAGNOSIS — R0789 Other chest pain: Secondary | ICD-10-CM | POA: Insufficient documentation

## 2018-04-10 LAB — NM MYOCAR MULTI W/SPECT W/WALL MOTION / EF
CHL CUP RESTING HR STRESS: 67 {beats}/min
CSEPEW: 1 METS
Exercise duration (min): 0 min
Exercise duration (sec): 0 s
LV dias vol: 219 mL (ref 62–150)
LV sys vol: 116 mL
MPHR: 162 {beats}/min
Peak HR: 78 {beats}/min
Percent HR: 48 %
SDS: 1
SRS: 1
SSS: 1
TID: 1

## 2018-04-10 LAB — GLUCOSE, CAPILLARY: Glucose-Capillary: 146 mg/dL — ABNORMAL HIGH (ref 70–99)

## 2018-04-10 MED ORDER — TECHNETIUM TC 99M TETROFOSMIN IV KIT
10.0000 | PACK | Freq: Once | INTRAVENOUS | Status: AC | PRN
  Administered 2018-04-10: 10.13 via INTRAVENOUS

## 2018-04-10 MED ORDER — TECHNETIUM TC 99M TETROFOSMIN IV KIT
30.0000 | PACK | Freq: Once | INTRAVENOUS | Status: AC | PRN
  Administered 2018-04-10: 30.26 via INTRAVENOUS

## 2018-04-10 MED ORDER — REGADENOSON 0.4 MG/5ML IV SOLN
0.4000 mg | Freq: Once | INTRAVENOUS | Status: AC
  Administered 2018-04-10: 0.4 mg via INTRAVENOUS

## 2018-04-14 ENCOUNTER — Telehealth: Payer: Self-pay

## 2018-04-14 IMAGING — CR DG CHEST 2V
2 series · 2 of 2 positions shown · non-contrast
Comparison: Prior chest x-ray 10/13/2014

CLINICAL DATA: 54-year-old male with generalized weakness,
dizziness, joint pain, fever and chills for the past week

EXAM:
CHEST  2 VIEW

[chest pa]
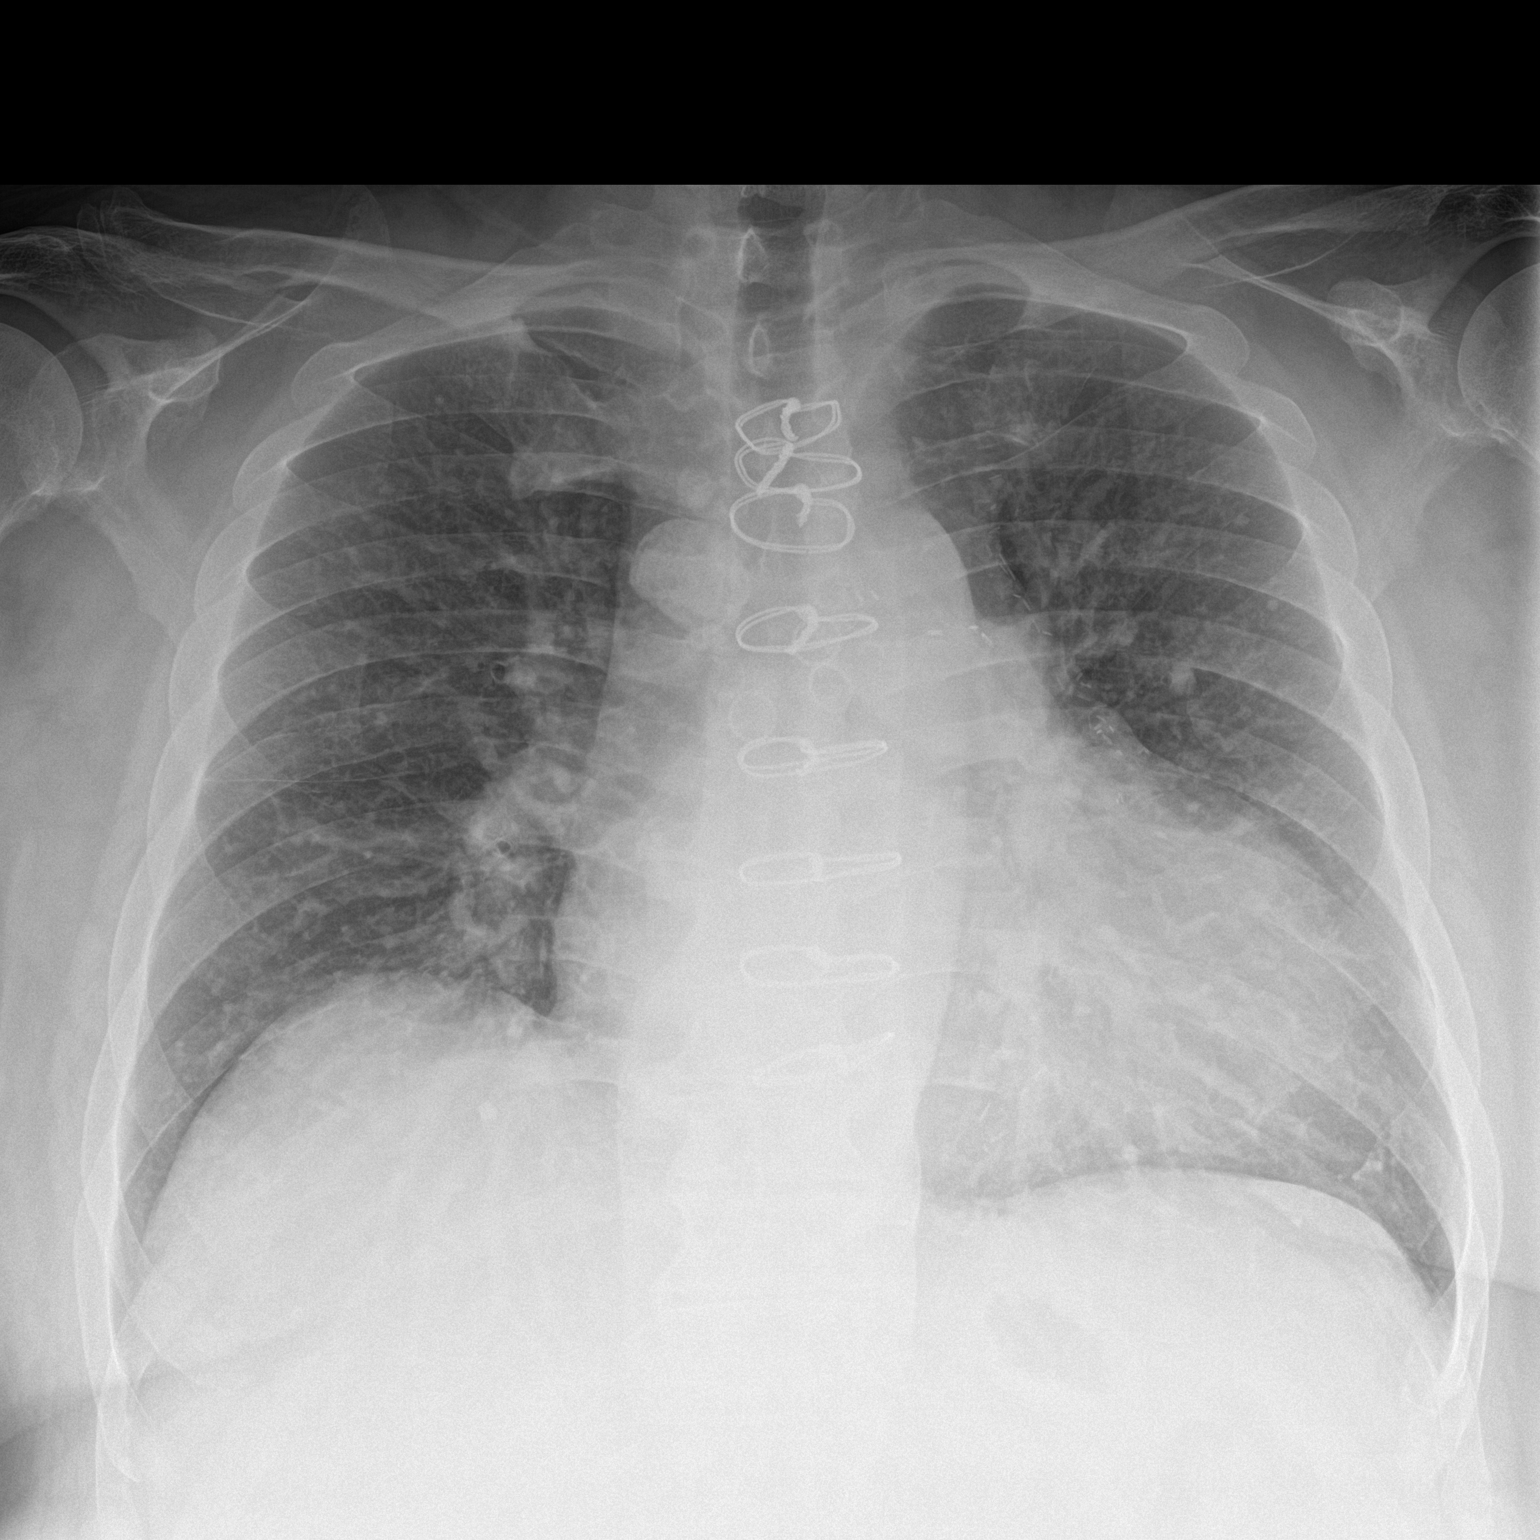

[chest lat]
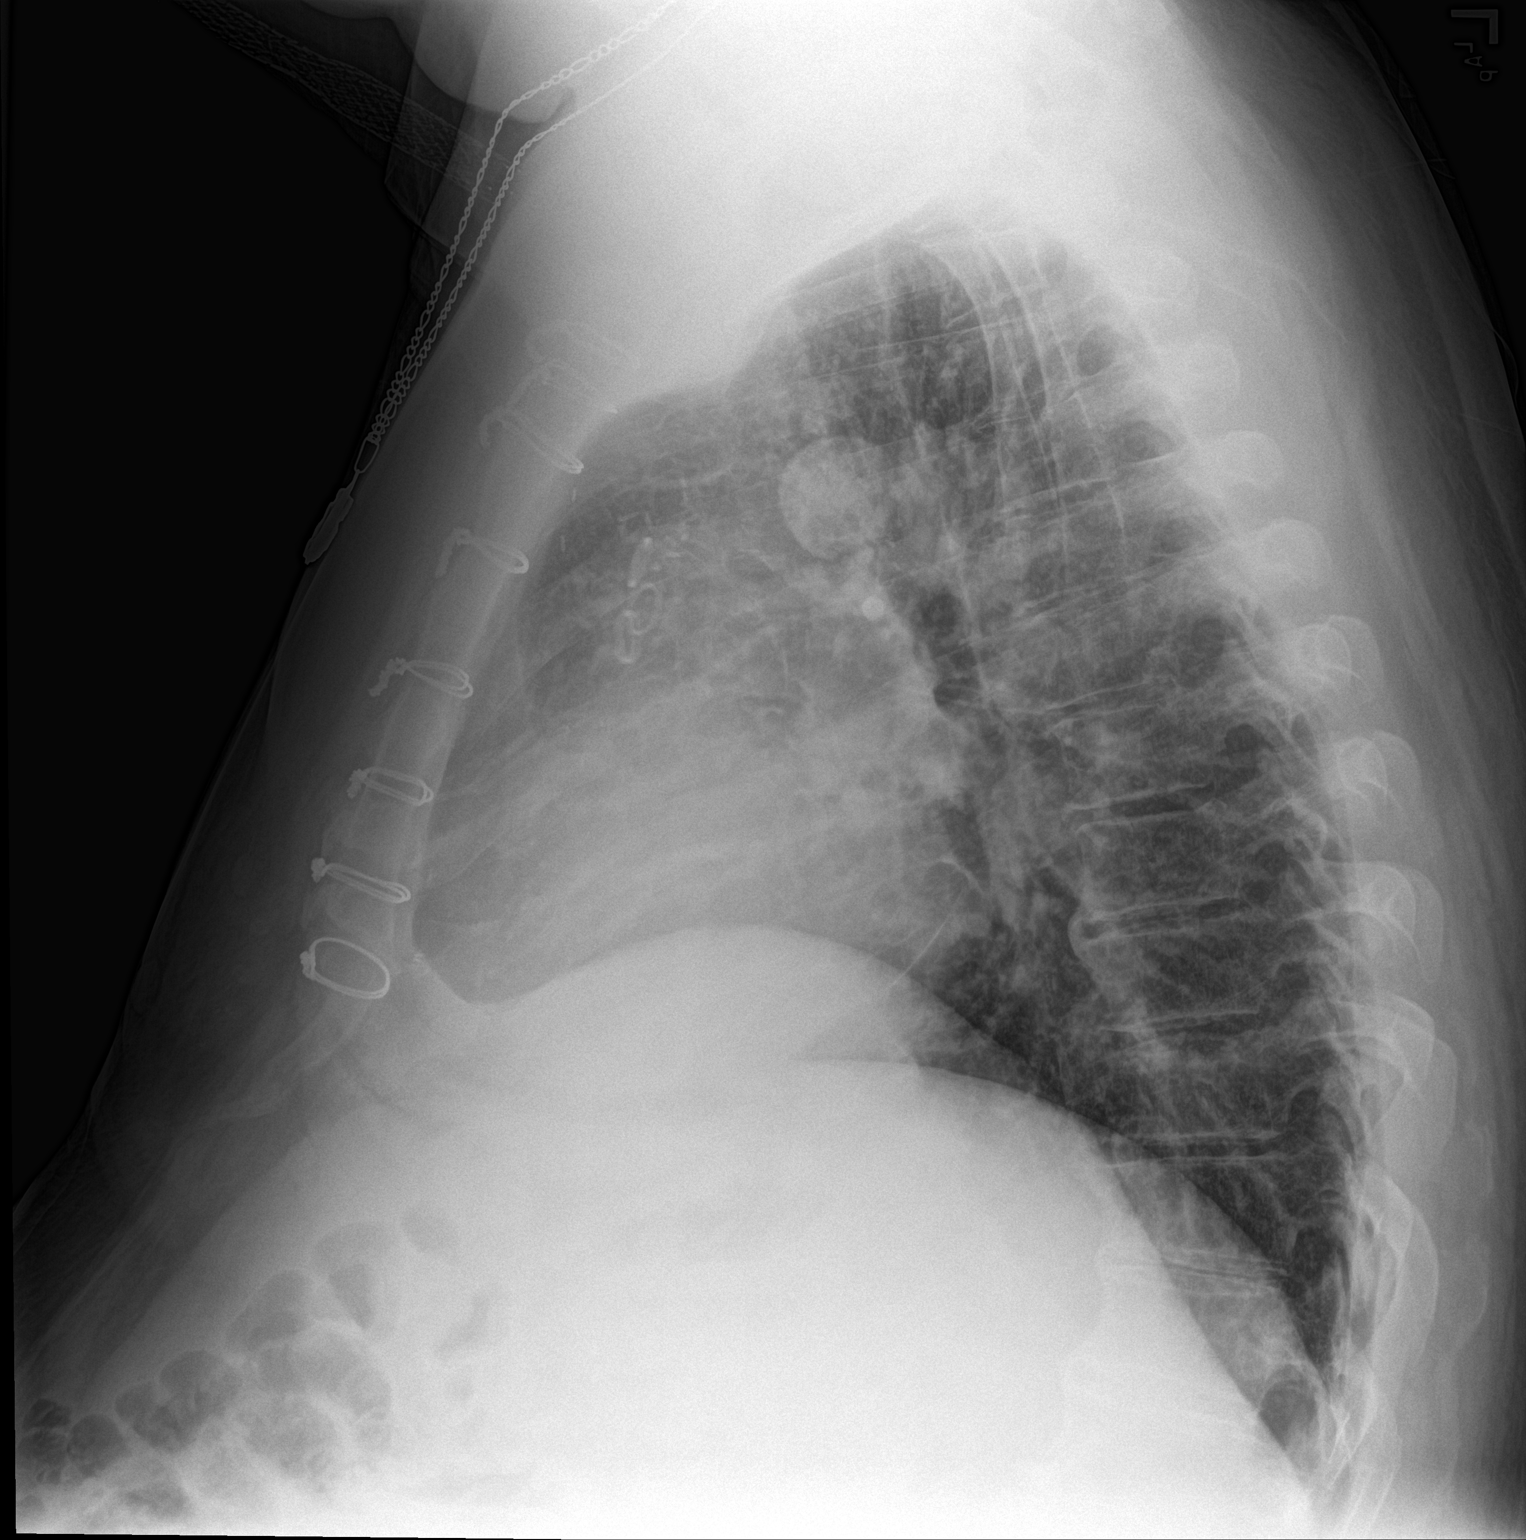

[2 of 2 positions shown; findings below may reference images not displayed]

FINDINGS: Stable cardiomegaly. Status post median sternotomy with evidence of
prior multivessel CABG. Bulky calcified right mediastinal
lymphadenopathy. Mild vascular congestion without overt edema
appears stable air compared to prior. No focal airspace
consolidation to suggest pneumonia. No pleural effusion or
pneumothorax. Osseous structures are intact. Mild degenerative
endplate changes.
IMPRESSION: Stable chest x-ray without acute cardiopulmonary process.

## 2018-04-14 NOTE — Telephone Encounter (Signed)
Call to pt to review stress test results. Pt verbalized understanding and will continue with echo as scheduled.  Advised pt to call for any further questions or concerns.

## 2018-04-14 NOTE — Telephone Encounter (Signed)
-----  Message from [name], PA-C sent at 04/12/2018  2:23 PM EST ----- Stress test showed no significant ischemia with an EF of 47%. Low risk scan.  Continue with planned echo later this month to confirm EF.

## 2018-04-16 IMAGING — CR DG CHEST 2V
1 series · 2 of 2 positions shown · non-contrast
Comparison: 08/30/15

CLINICAL DATA: Fevers.  Shortness of breath.

EXAM:
CHEST  2 VIEW

[Series 1: dg chest 2 view · 0.14mm/px · 2 of 2 slices shown]
[im 1/2]
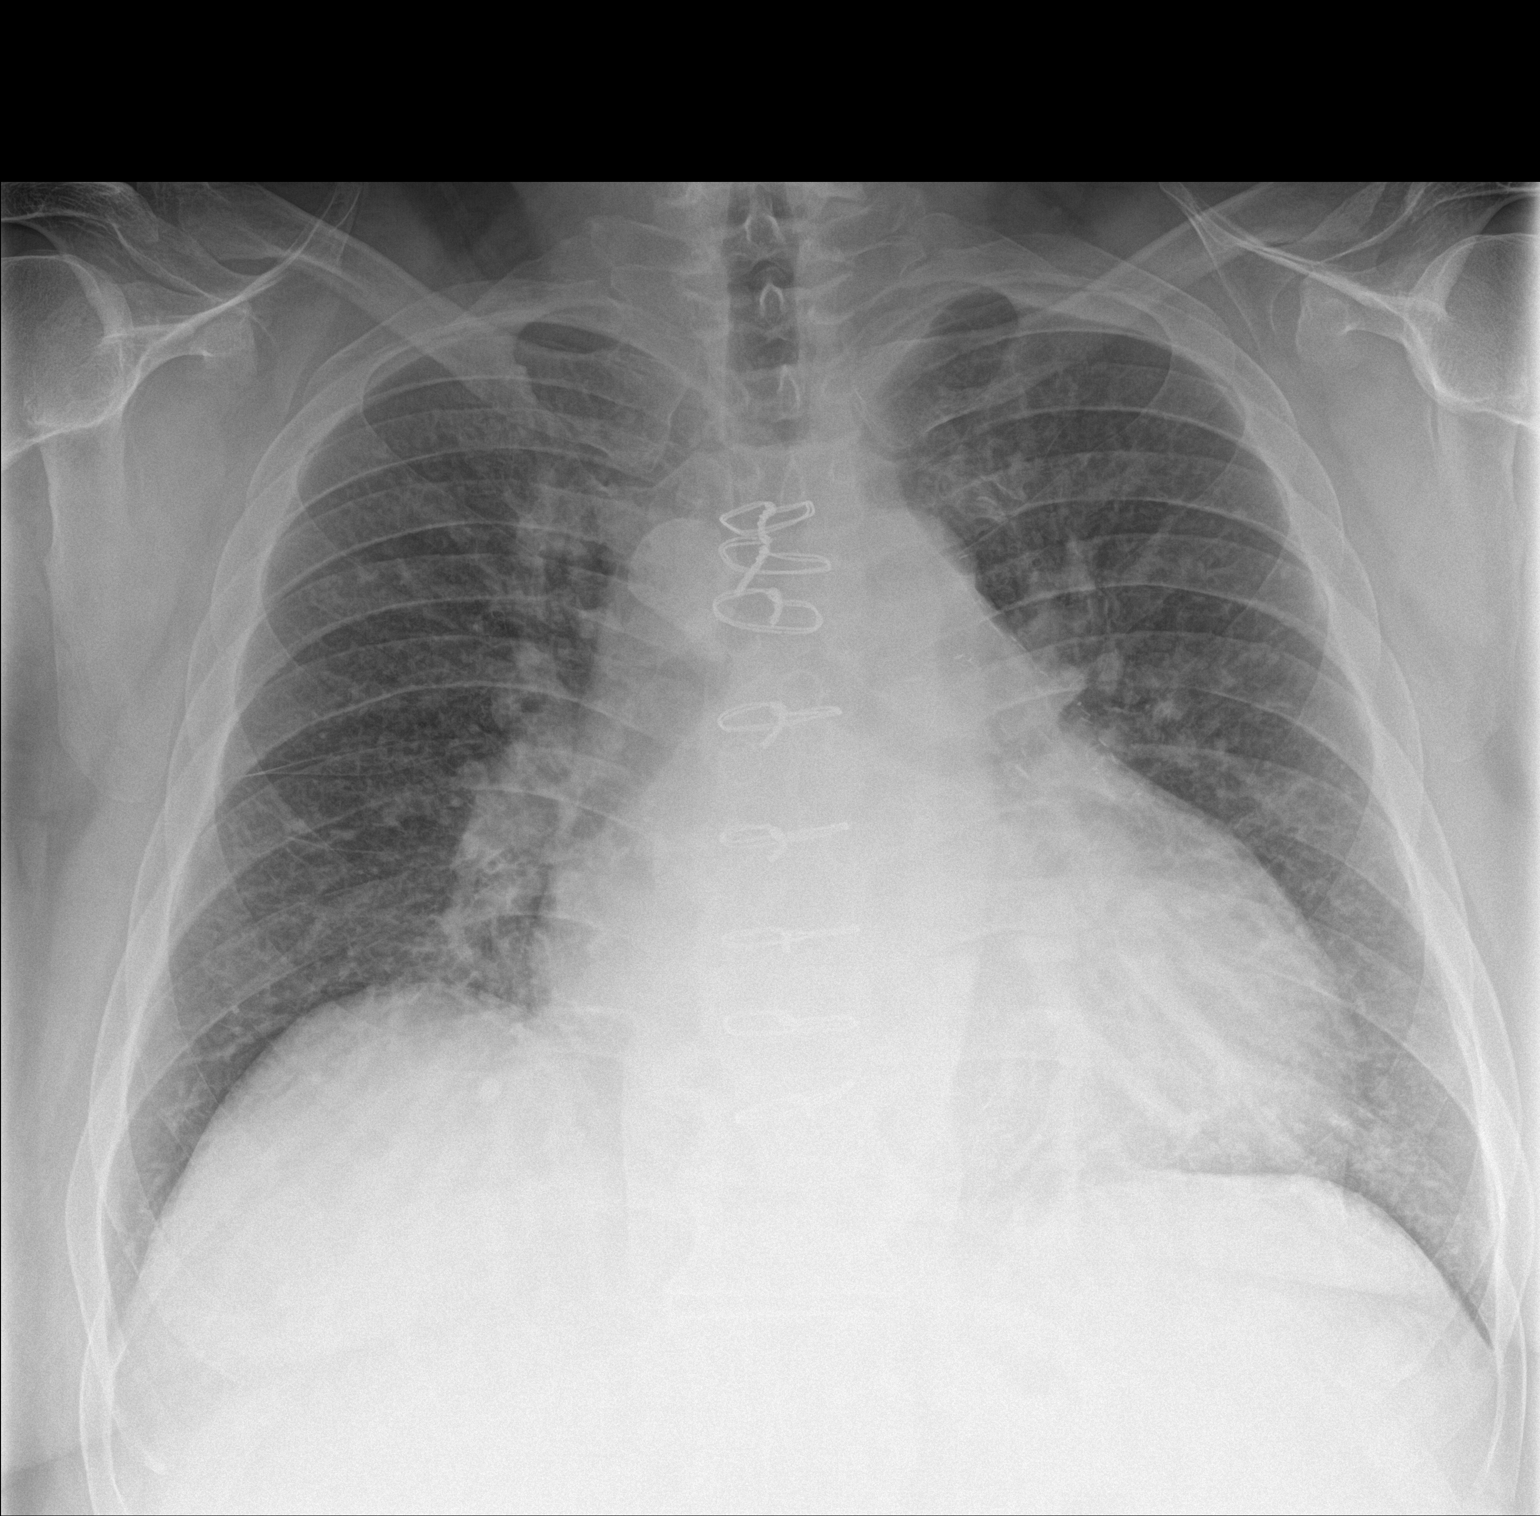
[im 2/2]
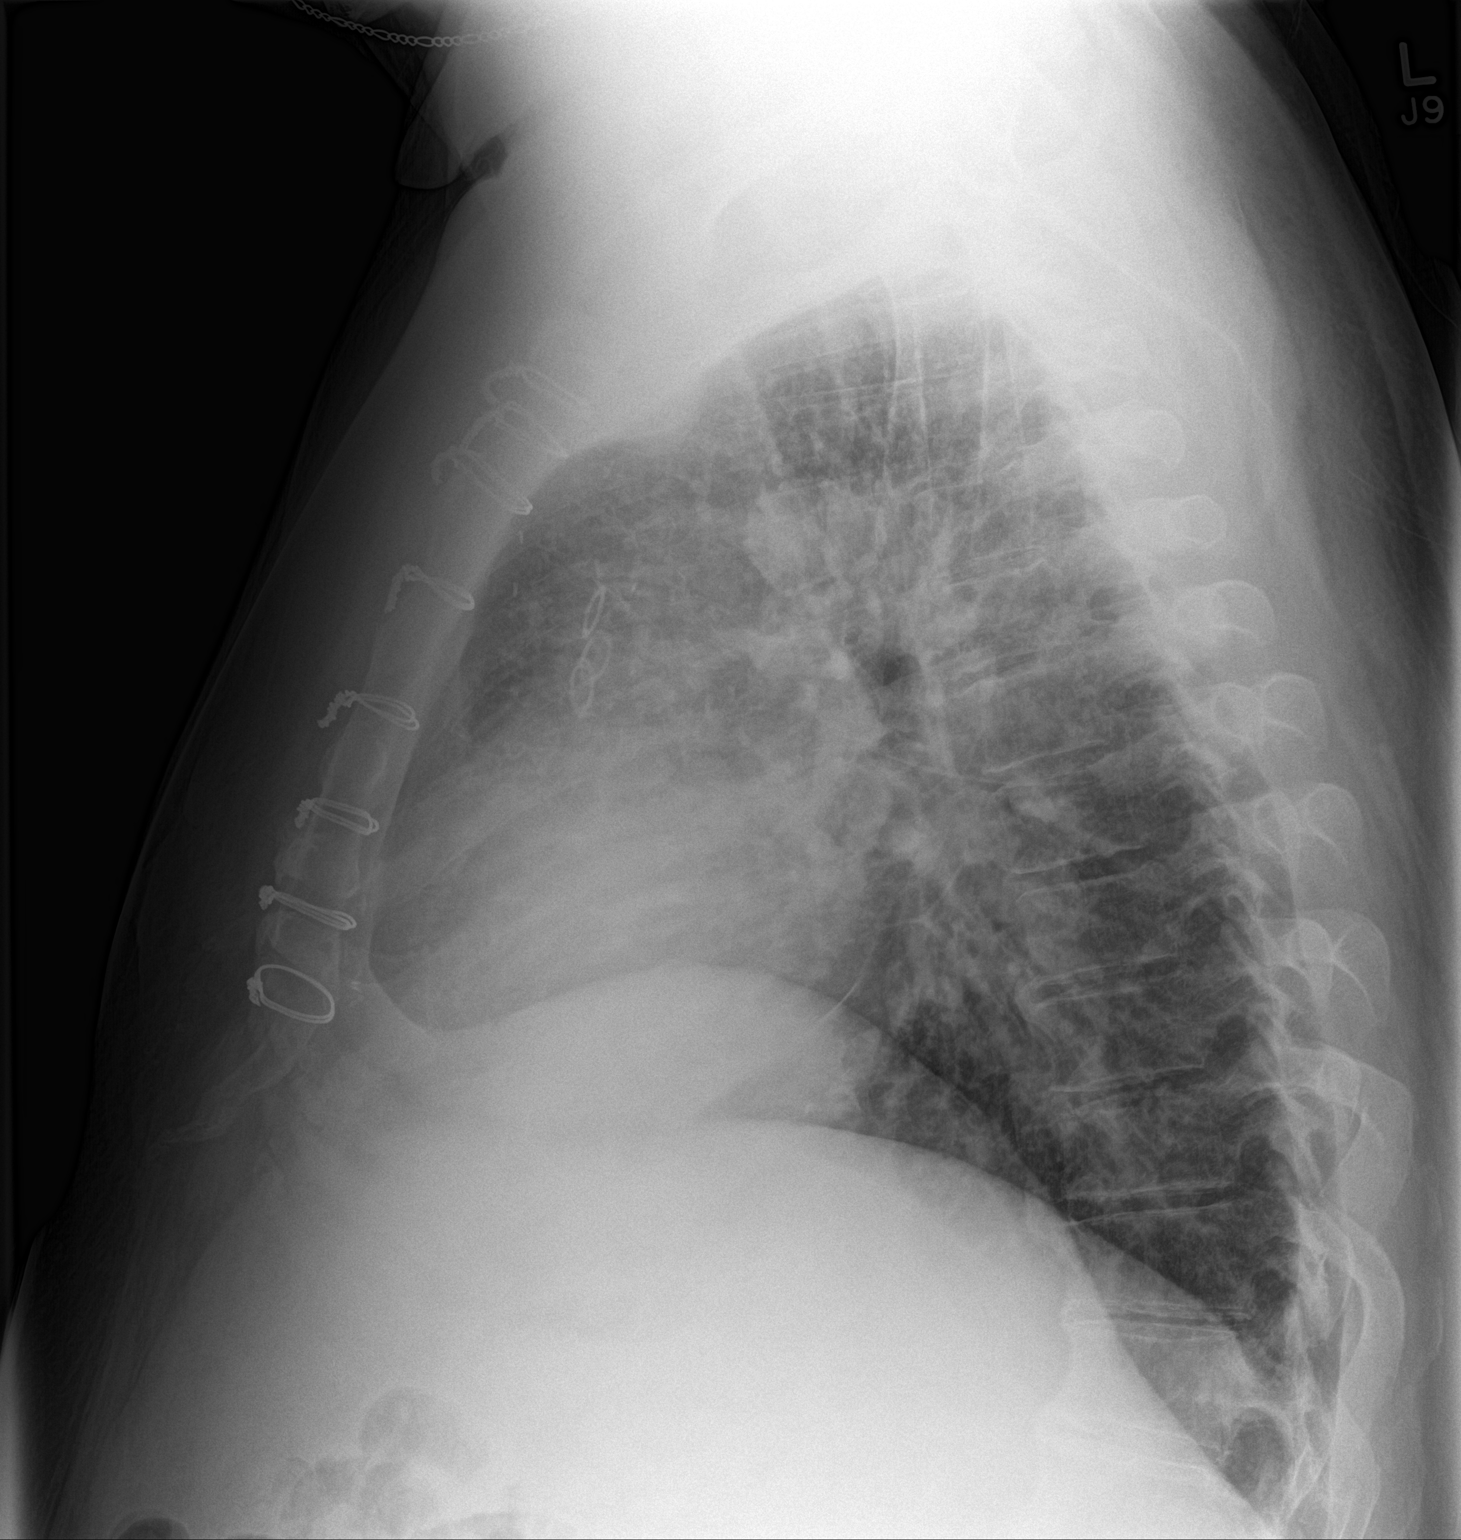

[2 of 2 positions shown; findings below may reference images not displayed]

FINDINGS: There is moderate cardiac enlargement. Previous median sternotomy
and CABG procedure. Pulmonary vascular congestion. No pleural
effusion or edema.
IMPRESSION: 1. Cardiac enlargement and pulmonary vascular congestion.

## 2018-04-24 ENCOUNTER — Ambulatory Visit (INDEPENDENT_AMBULATORY_CARE_PROVIDER_SITE_OTHER): Payer: 59

## 2018-04-24 DIAGNOSIS — R0600 Dyspnea, unspecified: Secondary | ICD-10-CM

## 2018-04-24 DIAGNOSIS — R0609 Other forms of dyspnea: Secondary | ICD-10-CM

## 2018-04-24 MED ORDER — PERFLUTREN LIPID MICROSPHERE
1.0000 mL | INTRAVENOUS | Status: AC | PRN
  Administered 2018-04-24: 2 mL via INTRAVENOUS

## 2018-04-27 ENCOUNTER — Telehealth: Payer: Self-pay

## 2018-04-27 DIAGNOSIS — I482 Chronic atrial fibrillation, unspecified: Secondary | ICD-10-CM | POA: Diagnosis not present

## 2018-04-27 DIAGNOSIS — G4733 Obstructive sleep apnea (adult) (pediatric): Secondary | ICD-10-CM | POA: Diagnosis not present

## 2018-04-27 DIAGNOSIS — E662 Morbid (severe) obesity with alveolar hypoventilation: Secondary | ICD-10-CM | POA: Diagnosis not present

## 2018-04-27 NOTE — Telephone Encounter (Signed)
Incoming call from patient. I went over Echo results from Ignacia Bayley, NP. Pt verbalized understanding. He reported weight loss since last OV and is now 255lbs. He feels like some of his weight loss is d/t impaired taste that started in late dec.  Pt has continued SOB, bilateral LE swelling that is consistent from last OV 1/9.  He reports "doubling up on Lasix at times".   He confirmed appt for next Monday 2/10 with Christell Faith, PA.  Advised pt to call for any further questions or concerns.   Routed to [employer], [place].

## 2018-04-27 NOTE — Telephone Encounter (Signed)
-----  Message from Theora Gianotti, NP sent at 04/25/2018  7:39 AM EST ----- Heart squeezing fxn on low end of nl (50-55%), which is better than what it was in 2018 and also better than what was seen on stress test.  No significant valvular dzs.  His right ventricular pressure was markedly elevated - likely representing volume overload (pulmonary pressure was nl on echo in 2018).  Has wt been stable?  Also be sure he is compliant with eliquis as RV pressures will also elevate with pulmonary emboli.

## 2018-04-27 NOTE — Telephone Encounter (Signed)
Attempted to call patient. LMTCB 2/3

## 2018-04-28 ENCOUNTER — Ambulatory Visit: Payer: 59 | Admitting: Internal Medicine

## 2018-04-28 ENCOUNTER — Ambulatory Visit: Payer: Self-pay

## 2018-04-28 NOTE — Telephone Encounter (Signed)
Pt. Reports he started noticing difficulty with focus and concentrating this past Jan. Comes and goes. States having difficulty at work concentrating. The only other symptom he has is dizziness that comes and goes," feels off balance." No headaches or vision difficulty. Asked to triage pt. Per [place]. No availability with Dr. Silvio Pate. Appointment made with Ms. Garnette Gunner.  Reason for Disposition . [1] Weakness of arm / hand, or leg / foot AND [2] is a chronic symptom (recurrent or ongoing AND present > 4 weeks) . Nursing judgment or information in reference  Answer Assessment - Initial Assessment Questions 1. SYMPTOM: "What is the main symptom you are concerned about?" (e.g., weakness, numbness)     Difficulty concentrating 2. ONSET: "When did this start?" (minutes, hours, days; while sleeping)     In Jan. 2020 3. LAST NORMAL: "When was the last time you were normal (no symptoms)?"     Last year in December 4. PATTERN "Does this come and go, or has it been constant since it started?"  "Is it present now?"     Comes and goes 5. CARDIAC SYMPTOMS: "Have you had any of the following symptoms: chest pain, difficulty breathing, palpitations?"     A fib, CHF 6. NEUROLOGIC SYMPTOMS: "Have you had any of the following symptoms: headache, dizziness, vision loss, double vision, changes in speech, unsteady on your feet?"     Dizziness that comes and goes 7. OTHER SYMPTOMS: "Do you have any other symptoms?"     Feels off balance - comes and goes 8. PREGNANCY: "Is there any chance you are pregnant?" "When was your last menstrual period?"     n/a  Answer Assessment - Initial Assessment Questions 1. REASON FOR CALL: "What is your main concern right now?"      Having difficulty concentrating and with focus 2. ONSET: "When did the Focus start?"     Beginning of Jan. 2020 3. SEVERITY: "How bad is the Decreased focu?"      Moderate 4. FEVER: "Do you have a fever?"     No 5. OTHER SYMPTOMS: "Do you have any  other new symptoms?"     Some dizziness that comes and goes 6. INTERVENTIONS AND RESPONSE: "What have you done so far to try to make this better? What medications have you used?"     Nothing 7. PREGNANCY: "Is there any chance you are pregnant?"     n/a  Protocols used: NEUROLOGIC DEFICIT-A-AH, NO GUIDELINE AVAILABLE-A-AH

## 2018-04-28 NOTE — Telephone Encounter (Signed)
Patient scheduled at an inappropriate time for today and had to be rescheduled to see Dr. Silvio Pate tomorrow at 1230.

## 2018-04-29 ENCOUNTER — Ambulatory Visit (INDEPENDENT_AMBULATORY_CARE_PROVIDER_SITE_OTHER): Payer: 59 | Admitting: Internal Medicine

## 2018-04-29 ENCOUNTER — Encounter: Payer: Self-pay | Admitting: Internal Medicine

## 2018-04-29 VITALS — BP 158/76 | HR 64 | Temp 97.5°F | Ht 71.0 in | Wt 264.0 lb

## 2018-04-29 DIAGNOSIS — F320 Major depressive disorder, single episode, mild: Secondary | ICD-10-CM | POA: Insufficient documentation

## 2018-04-29 DIAGNOSIS — I2510 Atherosclerotic heart disease of native coronary artery without angina pectoris: Secondary | ICD-10-CM

## 2018-04-29 MED ORDER — BUPROPION HCL ER (SR) 150 MG PO TB12: 150.0000 mg | ORAL_TABLET | Freq: Two times a day (BID) | ORAL | 3 refills | Status: DC

## 2018-04-29 NOTE — Patient Instructions (Signed)
Increase the bupropion to 139m twice a day. Let me know if you have any side effects from this.

## 2018-04-29 NOTE — Telephone Encounter (Signed)
Will see today

## 2018-04-29 NOTE — Assessment & Plan Note (Signed)
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

## 2018-04-29 NOTE — Progress Notes (Signed)
Subjective:    Patient ID: [name], male    DOB: [birth year], 59 y.o.   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 albuterol (PROVENTIL HFA;VENTOLIN HFA) 108 (90 Base) MCG/ACT inhaler Inhale 2 puffs every 6 (six) hours as needed into the lungs for wheezing or shortness of breath. 1 Inhaler 2  . AMBULATORY NON FORMULARY MEDICATION Medication Name: Nebulizer 1 each 0  . amLODipine (NORVASC) 5 MG tablet Take 1 tablet (5 mg total) by mouth daily. 90 tablet 3  . apixaban (ELIQUIS) 5 MG TABS tablet Take 1 tablet (5 mg total) by mouth 2 (two) times daily. 180 tablet 3  . beclomethasone (QVAR) 80 MCG/ACT inhaler Inhale 2 puffs into the lungs 2 (two) times daily. (Patient taking differently: Inhale 2 puffs into the lungs 2 (two) times daily as needed. ) 1 Inhaler 6  . buPROPion (WELLBUTRIN) 75 MG tablet TAKE 1 TABLET BY MOUTH TWICE DAILY 180 tablet 3  . colchicine 0.6 MG tablet Take 1 tablet (0.6 mg total) by mouth 2 (two) times daily as needed. 60 tablet 1  . furosemide (LASIX) 40 MG tablet TAKE 1 TABLET BY MOUTH 2 TIMES DAILY. TAKE AN ADDITIONAL 80MG IN THE PM AS NEEDED FOR WEIGHT GAIN. 240 tablet 3  . insulin aspart (NOVOLOG) 100 UNIT/ML injection Inject 1-5 Units into the skin once. Using as sliding scale. Takes 1 unit if is 10  above normal, 5 units if is 50 above normal. Upper limit of normal for him is 120.    Marland Kitchen insulin glargine (LANTUS) 100 unit/mL SOPN Inject 0.1 mLs (10 Units total) into the skin at bedtime. Currently using 15 units (Patient taking differently: Inject 10 Units into the skin at bedtime. Currently using 10 units) 15 mL 11  . lisinopril (PRINIVIL,ZESTRIL) 40 MG tablet TAKE 1 TABLET BY MOUTH DAILY 30 tablet 2  . magnesium 30 MG tablet Take 30 mg by mouth daily.    . metolazone (ZAROXOLYN) 5 MG tablet TAKE 1 TABLET BY MOUTH AS NEEDED 30 tablet 3  . mometasone (NASONEX) 50 MCG/ACT nasal spray PLACE 2 SPRAYS INTO THE NOSE AT BEDTIME. 17 g 11  . montelukast (SINGULAIR) 10 MG tablet TAKE 1 TABLET BY MOUTH AT BEDTIME. 30 tablet 5  . Multiple Vitamin (MULTIVITAMIN WITH MINERALS) TABS tablet Take 1 tablet by mouth daily.    . mycophenolate (MYFORTIC) 180 MG EC tablet Take 360 mg by mouth 2 (two) times daily.     Marland Kitchen omeprazole (PRILOSEC) 20 MG capsule TAKE 1 CAPSULE BY MOUTH ONCE DAILY 90 capsule 3  . potassium chloride (MICRO-K) 10 MEQ CR capsule Take 2 capsules (20 mEq total) by mouth 2 (two) times daily. (Patient taking differently: Take 30 mEq by mouth 2 (two) times daily. ) 360 capsule 4  . rosuvastatin (CRESTOR) 10 MG tablet  TAKE 1 TABLET BY MOUTH DAILY 90 tablet 0  . sildenafil (VIAGRA) 100 MG tablet TAKE 0.5-1 TABLETS (50-100 MG TOTAL) BY MOUTH DAILY AS NEEDED FOR ERECTILE DYSFUNCTION. 10 tablet 11  . tacrolimus (PROGRAF) 0.5 MG capsule Take 1 mg by mouth 2 (two) times daily.    . tamsulosin (FLOMAX) 0.4 MG CAPS capsule Take 0.4 mg by mouth daily.     Marland Kitchen spironolactone (ALDACTONE) 25 MG tablet Take 1 tablet (25 mg total) by mouth daily. 30 tablet 11   No current facility-administered medications on file prior to visit.     Allergies  Allergen Reactions  . Morphine Itching    Past Medical History:  Diagnosis Date  . Allergy    seasonal  . Amnestic MCI (mild cognitive impairment with memory loss)     . Asthma   . Benign neoplasm of colon   . CAD (coronary artery disease) 2005   a.  Status post four-vessel CABG in 2005  . Chronic atrial fibrillation    a. CHADS2VASc 4 (CHF, HTN, DM, vascular disease); b. Eliquis  . Chronic combined systolic and diastolic CHF (congestive heart failure) ([place])    a.  TTE 7/18: EF 45-50%, moderately dilated LA, RVSF normal, PASP normal  . Chronic venous stasis dermatitis of both lower extremities   . Cytomegaloviral disease ([place]) 2017  . Depression   . Diabetes mellitus   . Diverticulosis   . Dyspnea   . Erectile dysfunction   . ESRD (end stage renal disease) ([place]) 2005   a. HD 2004-2012; b. S/p renal transplant in 2012  . GERD (gastroesophageal reflux disease)   . Gout   . Hyperlipidemia    low since renal failure  . Hypertension   . Kidney transplant status, cadaveric 2012  . Obesity   . Pneumonia   . Purpura ([place])   . Sleep apnea    bipap with oxygen 2 l  . Trigger finger   . Ulcer 05/2016   Left shin  . Wears glasses     Past Surgical History:  Procedure Laterality Date  . BARIATRIC SURGERY    . CARDIAC CATHETERIZATION     [facility]  . CATARACT EXTRACTION W/PHACO Right 10/29/2017   Procedure: CATARACT EXTRACTION PHACO AND INTRAOCULAR LENS PLACEMENT ([place])  RIGHT DIABETIC;  Surgeon: [name], MD;  Location: [place];  Service: Ophthalmology;  Laterality: Right;  Diabetic - insulin  . CATARACT EXTRACTION W/PHACO Left 11/18/2017   Procedure: CATARACT EXTRACTION PHACO AND INTRAOCULAR LENS PLACEMENT ([place]) LEFT IVA/TOPICAL;  Surgeon: [name], MD;  Location: [place];  Service: Ophthalmology;  Laterality: Left;  DIABETES - insulin sleep apnea  . COLONOSCOPY WITH PROPOFOL N/A 04/22/2013   Procedure: COLONOSCOPY WITH PROPOFOL;  Surgeon: [name], MD;  Location: [facility];  Service: Endoscopy;  Laterality: N/A;  . CORONARY ARTERY BYPASS GRAFT  2005   X 4  . DG ANGIO AV SHUNT*L*     right and  left upper arms  . FASCIOTOMY  03/03/2012   Procedure: FASCIOTOMY;  Surgeon: [name], MD;  Location: [place];  Service: Orthopedics;  Laterality: Right;  FASCIOTOMY RIGHT SMALL FINGER  . FASCIOTOMY Left 08/17/2013   Procedure: FASCIOTOMY LEFT RING;  Surgeon: [name], MD;  Location: [place];  Service: Orthopedics;  Laterality: Left;  . INCISION AND DRAINAGE ABSCESS Left 10/15/2015   Procedure: INCISION AND DRAINAGE ABSCESS;  Surgeon: [name], MD;  Location: [facility];  Service:  General;  Laterality: Left;  . KIDNEY TRANSPLANT  09/13/2010   cadaver--at [facility]  . RIGHT HEART CATH N/A 11/15/2016   Procedure: RIGHT HEART CATH;  Surgeon: [name], MD;  Location: [place] [facility];  Service: Cardiovascular;  Laterality: N/A;  . TYMPANIC MEMBRANE REPAIR  1/12   left  . VASECTOMY      Family History  Problem Relation Age of Onset  . Heart disease Father   . Kidney failure Father   . Kidney disease Father   . Diabetes Maternal Grandmother   . Breast cancer Maternal Grandmother   . Valvular heart disease Mother   . Liver cancer Paternal Uncle   . Liver cancer Paternal Grandmother   . Prostate cancer Neg Hx     Social History   Socioeconomic History  . Marital status: Married    Spouse name: Not on file  . Number of children: 2  . Years of education: Not on file  . Highest education level: Not on file  Occupational History  . Occupation: Academic librarian: unemployed    Comment: disabled due to kidney failure  Social Needs  . Financial resource strain: Not on file  . Food insecurity:    Worry: Not on file    Inability: Not on file  . Transportation needs:    Medical: Not on file    Non-medical: Not on file  Tobacco Use  . Smoking status: Former Smoker    Types: Cigars    Last attempt to quit: 09/02/1994    Years since quitting: 23.6  . Smokeless tobacco: Never Used  Substance and Sexual Activity  .  Alcohol use: Yes    Alcohol/week: 0.0 - 1.0 standard drinks    Comment: occasional- 3 in the past year  . Drug use: No  . Sexual activity: Not on file  Lifestyle  . Physical activity:    Days per week: Not on file    Minutes per session: Not on file  . Stress: Not on file  Relationships  . Social connections:    Talks on phone: Not on file    Gets together: Not on file    Attends religious service: Not on file    Active member of club or organization: Not on file    Attends meetings of clubs or organizations: Not on file    Relationship status: Not on file  . Intimate partner violence:    Fear of current or ex partner: Not on file    Emotionally abused: Not on file    Physically abused: Not on file    Forced sexual activity: Not on file  Other Topics Concern  . Not on file  Social History Narrative   In school for culinary arts--- will finish 12/16      Has living will   Wife is health care POA   Would accept resuscitation attempts   Hasn't considered tube feedings   Review of Systems  No fluid retention Breathing has been okay Sleep is variable---up every hour about half the nights Feels tired--but generally okay if he does something Appetite is not there----ongoing issue ("I have to remember to eat") Sugars highly variable---A1c up to 9.4 at recent check at [place] issues--especially names    Objective:   Physical Exam  Constitutional: He appears well-developed. No distress.  Neck: No thyromegaly present.  Cardiovascular: Normal rate, regular rhythm and normal heart sounds. Exam reveals no gallop.  No murmur heard.  Respiratory: Effort normal and breath sounds normal. No respiratory distress. He has no wheezes. He has no rales.  GI: Soft. There is no abdominal tenderness.  Musculoskeletal:        General: No edema.  Lymphadenopathy:    He has no cervical adenopathy.  Psychiatric:  Normal interaction Speech and appearance are normal Depressed but  appropriate affect           Assessment & Plan:

## 2018-05-01 NOTE — Progress Notes (Signed)
Cardiology Office Note Date:  05/04/2018  Patient ID:  [name], [name] 08-08-1960, MRN [id number] PCP:  [name], MD  Cardiologist:  [name], MD    Chief Complaint: Follow up  History of Present Illness: [name] is a 58 y.o. male with history of CAD status post CABG in 2005, chronic combined systolic and diastolic CHF secondary to ICM with prior EF of 35 to 40% now improved to 50 to 55% by echo in 03/2018, chronic A. fib on Eliquis, pulmonary hypertension, ESRD previously on hemodialysis from 2004 through 2012 status post kidney transplant in 2012, anemia of chronic disease, DM2, HTN, HLD, beta-blocker induced bradycardia, obesity status post gastric bypass surgery in 11/2013, and sleep apnea on BiPAP who presents for follow-up of his recent Myoview and echo.  Patient underwent four-vessel bypass in 2005 without any ischemic events since.  Patient was noted to have worsening shortness of breath with right-sided heart failure in the summer 2018.  Echo in 09/2016 was an overall difficult study.  EF was noted to be improved at 45-50%, moderately dilated left atrium, RV systolic function normal, PASP normal.  He was admitted in 10/2016 requiring IV diuresis.  RHC showed a severely elevated filling pressures, severe pulmonary hypertension and low normal cardiac output after several days of diuresis.  His pulmonary wedge pressure was 31 mmHg.  He was diuresed with a Lasix drip.  His weight at time of discharge was 277 pounds.  Since then, he has been following with the [facility] CHF clinic.  When he was last seen in our office in 03/2017, his weight remained around 265 to 275 pounds at home.  He was taking Lasix 40 mg twice daily with metolazone as needed.  He was seen by the [facility] CHF clinic on 03/13/2018 with a BP of 157/79, weight 271 pounds.  No changes were made.  I saw him on 04/02/2018 with a complaint of a 6 month history of increased fatigue and SOB. His weight was  fluctuating between 265-270 pounds. Stress test on 04/10/2018 showed no significant ischemia, small region of fixed perfusion defect in the inferoapical and apical septal region possibly attenuation artifact, EF 47%, low risk study. He underwent echo on 04/24/2018 that showed an EF 50-55%, moderately dilated LV/RV with moderately reduced RVSF, moderately dilated left atrium, mild MR, severely elevated RVSP of 72/6 mmHg, and moderate TR. When compared to echo in 2018, his EF was improved from 45-50%.   Phone note on 2/3 indicated a weight loss down to 255 pounds, that the patient attributed to impaired taste with continued SOB and bilateral lower extremity swelling resulting in the doubling of his Lasix at times.   Labs: 11/2017 - potassium 4.7, glucose 172, BUN 36, serum creatinine 1.33 (baseline approximately 1.1-1.2) 09/2017 - LDL 49, A1c 7.5  He comes in doing well from a cardiac perspective.  Overall, he feels improved compared to his last office visit though does still feel like he is holding onto some fluid.  Weight at home has been improving and was down to 255 pounds in his "birthday suit."  He continues to take Lasix 40 mg twice daily and if he notes a weight greater than 260 pounds x 530 p.m. he will take an additional 40 mg of Lasix.  He last had to do this 1 week prior.  He has not needed any PRN metolazone.  Blood pressure remains well controlled.  He denies any lower extremity swelling, abdominal distention, orthopnea, PND,  or early satiety.  No chest pain, dizziness, presyncope, or syncope.  He is compliant with BiPAP.  He has rejoined the gym and has begun a low intensity treadmill program which he has tolerated well.  Past Medical History:  Diagnosis Date  . Allergy    seasonal  . Amnestic MCI (mild cognitive impairment with memory loss)   . Asthma   . Benign neoplasm of colon   . CAD (coronary artery disease) 2005   a.  Status post four-vessel CABG in 2005  . Chronic atrial  fibrillation    a. CHADS2VASc 4 (CHF, HTN, DM, vascular disease); b. Eliquis  . Chronic combined systolic and diastolic CHF (congestive heart failure) ([place])    a.  TTE 7/18: EF 45-50%, moderately dilated LA, RVSF normal, PASP normal  . Chronic venous stasis dermatitis of both lower extremities   . Cytomegaloviral disease ([place]) 2017  . Depression   . Diabetes mellitus   . Diverticulosis   . Dyspnea   . Erectile dysfunction   . ESRD (end stage renal disease) ([place]) 2005   a. HD 2004-2012; b. S/p renal transplant in 2012  . GERD (gastroesophageal reflux disease)   . Gout   . Hyperlipidemia    low since renal failure  . Hypertension   . Kidney transplant status, cadaveric 2012  . Obesity   . Pneumonia   . Purpura ([place])   . Sleep apnea    bipap with oxygen 2 l  . Trigger finger   . Ulcer 05/2016   Left shin  . Wears glasses     Past Surgical History:  Procedure Laterality Date  . BARIATRIC SURGERY    . CARDIAC CATHETERIZATION     [facility]  . CATARACT EXTRACTION W/PHACO Right 10/29/2017   Procedure: CATARACT EXTRACTION PHACO AND INTRAOCULAR LENS PLACEMENT ([place])  RIGHT DIABETIC;  Surgeon: [name], MD;  Location: [place];  Service: Ophthalmology;  Laterality: Right;  Diabetic - insulin  . CATARACT EXTRACTION W/PHACO Left 11/18/2017   Procedure: CATARACT EXTRACTION PHACO AND INTRAOCULAR LENS PLACEMENT ([place]) LEFT IVA/TOPICAL;  Surgeon: [name], MD;  Location: [place];  Service: Ophthalmology;  Laterality: Left;  DIABETES - insulin sleep apnea  . COLONOSCOPY WITH PROPOFOL N/A 04/22/2013   Procedure: COLONOSCOPY WITH PROPOFOL;  Surgeon: [name], MD;  Location: [facility];  Service: Endoscopy;  Laterality: N/A;  . CORONARY ARTERY BYPASS GRAFT  2005   X 4  . DG ANGIO AV SHUNT*L*     right and left upper arms  . FASCIOTOMY  03/03/2012   Procedure: FASCIOTOMY;  Surgeon: [name], MD;  Location: [place];  Service:  Orthopedics;  Laterality: Right;  FASCIOTOMY RIGHT SMALL FINGER  . FASCIOTOMY Left 08/17/2013   Procedure: FASCIOTOMY LEFT RING;  Surgeon: [name], MD;  Location: [place];  Service: Orthopedics;  Laterality: Left;  . INCISION AND DRAINAGE ABSCESS Left 10/15/2015   Procedure: INCISION AND DRAINAGE ABSCESS;  Surgeon: [name], MD;  Location: [facility];  Service: General;  Laterality: Left;  . KIDNEY TRANSPLANT  09/13/2010   cadaver--at [facility]  . RIGHT HEART CATH N/A 11/15/2016   Procedure: RIGHT HEART CATH;  Surgeon: [name], MD;  Location: [place] [facility];  Service: Cardiovascular;  Laterality: N/A;  . TYMPANIC MEMBRANE REPAIR  1/12   left  . VASECTOMY      Current Meds  Medication Sig  . albuterol (PROVENTIL HFA;VENTOLIN HFA) 108 (90 Base)  MCG/ACT inhaler Inhale 2 puffs every 6 (six) hours as needed into the lungs for wheezing or shortness of breath.  . AMBULATORY NON FORMULARY MEDICATION Medication Name: Nebulizer  . amLODipine (NORVASC) 5 MG tablet Take 1 tablet (5 mg total) by mouth daily.  Marland Kitchen apixaban (ELIQUIS) 5 MG TABS tablet Take 1 tablet (5 mg total) by mouth 2 (two) times daily.  . beclomethasone (QVAR) 80 MCG/ACT inhaler Inhale 2 puffs into the lungs 2 (two) times daily. (Patient taking differently: Inhale 2 puffs into the lungs 2 (two) times daily as needed. )  . buPROPion (WELLBUTRIN SR) 150 MG 12 hr tablet Take 1 tablet (150 mg total) by mouth 2 (two) times daily.  . colchicine 0.6 MG tablet Take 1 tablet (0.6 mg total) by mouth 2 (two) times daily as needed.  . furosemide (LASIX) 40 MG tablet TAKE 1 TABLET BY MOUTH 2 TIMES DAILY. TAKE AN ADDITIONAL 80MG IN THE PM AS NEEDED FOR WEIGHT GAIN.  Marland Kitchen insulin aspart (NOVOLOG) 100 UNIT/ML injection Inject 1-5 Units into the skin once. Using as sliding scale. Takes 1 unit if is 10 above normal, 5 units if is 50 above normal. Upper limit of normal for him is 120.  Marland Kitchen insulin glargine (LANTUS) 100  unit/mL SOPN Inject 0.1 mLs (10 Units total) into the skin at bedtime. Currently using 15 units (Patient taking differently: Inject 10 Units into the skin at bedtime. Currently using 10 units)  . lisinopril (PRINIVIL,ZESTRIL) 40 MG tablet TAKE 1 TABLET BY MOUTH DAILY  . magnesium 30 MG tablet Take 30 mg by mouth daily.  . metolazone (ZAROXOLYN) 5 MG tablet TAKE 1 TABLET BY MOUTH AS NEEDED  . mometasone (NASONEX) 50 MCG/ACT nasal spray PLACE 2 SPRAYS INTO THE NOSE AT BEDTIME.  . montelukast (SINGULAIR) 10 MG tablet TAKE 1 TABLET BY MOUTH AT BEDTIME.  . Multiple Vitamin (MULTIVITAMIN WITH MINERALS) TABS tablet Take 1 tablet by mouth daily.  . mycophenolate (MYFORTIC) 180 MG EC tablet Take 360 mg by mouth 2 (two) times daily.   Marland Kitchen omeprazole (PRILOSEC) 20 MG capsule TAKE 1 CAPSULE BY MOUTH ONCE DAILY  . potassium chloride (MICRO-K) 10 MEQ CR capsule Take 2 capsules (20 mEq total) by mouth 2 (two) times daily. (Patient taking differently: Take 30 mEq by mouth 2 (two) times daily. )  . rosuvastatin (CRESTOR) 10 MG tablet TAKE 1 TABLET BY MOUTH DAILY  . sildenafil (VIAGRA) 100 MG tablet TAKE 0.5-1 TABLETS (50-100 MG TOTAL) BY MOUTH DAILY AS NEEDED FOR ERECTILE DYSFUNCTION.  Marland Kitchen tacrolimus (PROGRAF) 0.5 MG capsule Take 1 mg by mouth 2 (two) times daily.  . tamsulosin (FLOMAX) 0.4 MG CAPS capsule Take 0.4 mg by mouth daily.     Allergies:   Morphine   Social History:  The patient  reports that he quit smoking about 23 years ago. His smoking use included cigars. He has never used smokeless tobacco. He reports current alcohol use. He reports that he does not use drugs.   Family History:  The patient's family history includes Breast cancer in his maternal grandmother; Diabetes in his maternal grandmother; Heart disease in his father; Kidney disease in his father; Kidney failure in his father; Liver cancer in his paternal grandmother and paternal uncle; Valvular heart disease in his mother.  ROS:   Review of  Systems  Constitutional: Positive for malaise/fatigue. Negative for chills, diaphoresis, fever and weight loss.  HENT: Negative for congestion.   Eyes: Negative for discharge and redness.  Respiratory: Positive for  shortness of breath. Negative for cough, hemoptysis, sputum production and wheezing.   Cardiovascular: Negative for chest pain, palpitations, orthopnea, claudication, leg swelling and PND.  Gastrointestinal: Negative for abdominal pain, blood in stool, heartburn, melena, nausea and vomiting.  Genitourinary: Negative for hematuria.  Musculoskeletal: Negative for falls and myalgias.  Skin: Negative for rash.  Neurological: Negative for dizziness, tingling, tremors, sensory change, speech change, focal weakness, loss of consciousness and weakness.  Endo/Heme/Allergies: Does not bruise/bleed easily.  Psychiatric/Behavioral: Negative for substance abuse. The patient is not nervous/anxious.   All other systems reviewed and are negative.    PHYSICAL EXAM:  VS:  BP 140/80 (BP Location: Left Arm, Patient Position: Sitting, Cuff Size: Normal)   Pulse 67   Ht 5' 10.5" (1.791 m)   Wt 266 lb (120.7 kg)   BMI 37.63 kg/m  BMI: Body mass index is 37.63 kg/m.  Physical Exam  Constitutional: He is oriented to person, place, and time. He appears well-developed and well-nourished.  HENT:  Head: Normocephalic and atraumatic.  Eyes: Right eye exhibits no discharge. Left eye exhibits no discharge.  Neck: Normal range of motion. No JVD present.  Cardiovascular: Normal rate, S1 normal, S2 normal and normal heart sounds. An irregularly irregular rhythm present. Exam reveals no distant heart sounds, no friction rub, no midsystolic click and no opening snap.  No murmur heard. Pulses:      Posterior tibial pulses are 2+ on the right side and 2+ on the left side.  Pulmonary/Chest: Effort normal and breath sounds normal. No respiratory distress. He has no decreased breath sounds. He has no wheezes. He  has no rales. He exhibits no tenderness.  Abdominal: Soft. He exhibits no distension. There is no abdominal tenderness.  Musculoskeletal:        General: No edema.  Neurological: He is alert and oriented to person, place, and time.  Skin: Skin is warm and dry. No cyanosis. Nails show no clubbing.  Psychiatric: He has a normal mood and affect. His speech is normal and behavior is normal. Judgment and thought content normal.     EKG:  Was ordered and interpreted by me today. Shows A. fib, 67 bpm, left axis deviation, rare PVCs, nonspecific IVCD  Recent Labs: 08/22/2017: Magnesium 2.1 12/12/2017: BUN 36; Creatinine, Ser 1.23; Potassium 4.7; Sodium 138 04/02/2018: Hemoglobin 11.0; Platelets 188  10/09/2017: Cholesterol 89; LDL Cholesterol 49   CrCl cannot be calculated (Patient's most recent lab result is older than the maximum 21 days allowed.).   Wt Readings from Last 3 Encounters:  05/04/18 266 lb (120.7 kg)  04/29/18 264 lb (119.7 kg)  04/02/18 267 lb 12 oz (121.5 kg)     Other studies reviewed: Additional studies/records reviewed today include: summarized above  ASSESSMENT AND PLAN:  1. CAD involving the native coronary arteries status post CABG without angina: Currently chest pain-free.  Recent Lexiscan Myoview low risk.  Continue Eliquis in place of aspirin.  Remains on Crestor as below.  Not on beta-blocker secondary to prior beta-blocker induced bradycardia.  Aggressive risk factor modification and secondary prevention.  2. Chronic combined CHF/pulmonary hypertension: I suspect he remains fluid up though it is improved from 1 month prior.  He notes he is much improved when compared to how he felt in the summer 2018 when he required inpatient diuresis.  We did discuss repeating a limited echocardiogram to evaluate his RV/PA pressures following diuresis versus at a minimum right heart catheterization to estimate his fluid status.  He prefers to  avoid invasive studies at this time and  in this setting we will continue outpatient diuresis with Lasix 40 mg twice daily with an added dose as needed shortness of breath or weight gain.  Ultimately, I do think he would benefit from evaluation from the advanced heart failure service and he is agreeable to this.  We will further diurese him as outlined above with plans to refer to the advanced heart failure service in follow-up based on his symptoms and noninvasive imaging.  I feel like a left heart cath is less needed at this time given his renal transplant status, recent low risk Myoview and in the setting of him rejoining a gym and walking on the treadmill without any symptoms suggestive of angina.  3. Chronic A. Fib: Ventricular rate is well controlled.  Not on any rate limiting medication secondary to prior bradycardic heart rates.  Continue Eliquis.  4. ESRD status post renal transplant: Followed by nephrology at [facility] with a stable renal function on recent labs.  Check BMP today.  5. Hypertension: Blood pressure is reasonably controlled today.  Continue current medication.  6. Hyperlipidemia: LDL of 49 from 09/2017.  Continue Crestor.  7. Obesity with OSA: Weight loss is advised.  He is compliant with BiPAP.  Disposition: F/u with [name] in 4 to 6 weeks.  Current medicines are reviewed at length with the patient today.  The patient did not have any concerns regarding medicines.  Signed, Christell Faith, PA-C 05/04/2018 3:27 PM     [facility] [address] [place], [place] [address] [phone]

## 2018-05-04 ENCOUNTER — Encounter: Payer: Self-pay | Admitting: Physician Assistant

## 2018-05-04 ENCOUNTER — Ambulatory Visit (INDEPENDENT_AMBULATORY_CARE_PROVIDER_SITE_OTHER): Payer: 59 | Admitting: Physician Assistant

## 2018-05-04 VITALS — BP 140/80 | HR 67 | Ht 70.5 in | Wt 266.0 lb

## 2018-05-04 DIAGNOSIS — I2510 Atherosclerotic heart disease of native coronary artery without angina pectoris: Secondary | ICD-10-CM

## 2018-05-04 DIAGNOSIS — I10 Essential (primary) hypertension: Secondary | ICD-10-CM | POA: Diagnosis not present

## 2018-05-04 DIAGNOSIS — G4733 Obstructive sleep apnea (adult) (pediatric): Secondary | ICD-10-CM | POA: Diagnosis not present

## 2018-05-04 DIAGNOSIS — I482 Chronic atrial fibrillation, unspecified: Secondary | ICD-10-CM | POA: Diagnosis not present

## 2018-05-04 DIAGNOSIS — I5042 Chronic combined systolic (congestive) and diastolic (congestive) heart failure: Secondary | ICD-10-CM | POA: Diagnosis not present

## 2018-05-04 DIAGNOSIS — Z940 Kidney transplant status: Secondary | ICD-10-CM

## 2018-05-04 DIAGNOSIS — I272 Pulmonary hypertension, unspecified: Secondary | ICD-10-CM | POA: Diagnosis not present

## 2018-05-04 DIAGNOSIS — N186 End stage renal disease: Secondary | ICD-10-CM | POA: Diagnosis not present

## 2018-05-04 DIAGNOSIS — E785 Hyperlipidemia, unspecified: Secondary | ICD-10-CM

## 2018-05-04 NOTE — Patient Instructions (Signed)
Medication Instructions:  No changes If you need a refill on your cardiac medications before your next appointment, please call your pharmacy.   Lab work: Your provider would like for you to have the following labs today: BMET  If you have labs (blood work) drawn today and your tests are completely normal, you will receive your results only by: Marland Kitchen MyChart Message (if you have MyChart) OR . A paper copy in the mail If you have any lab test that is abnormal or we need to change your treatment, we will call you to review the results.  Testing/Procedures: Your physician has requested that you have an limited echocardiogram in one month. Echocardiography is a painless test that uses sound waves to create images of your heart. It provides your doctor with information about the size and shape of your heart and how well your heart's chambers and valves are working. You may receive an ultrasound enhancing agent through an IV if needed to better visualize your heart during the echo.This procedure takes approximately one hour. There are no restrictions for this procedure. This will take place at the [facility] clinic.    Follow-Up: Follow up with [name] after the limited echo in one month.

## 2018-05-05 ENCOUNTER — Telehealth: Payer: Self-pay

## 2018-05-05 LAB — BASIC METABOLIC PANEL
BUN/Creatinine Ratio: 21 — ABNORMAL HIGH (ref 9–20)
BUN: 30 mg/dL — ABNORMAL HIGH (ref 6–24)
CO2: 25 mmol/L (ref 20–29)
Calcium: 9.3 mg/dL (ref 8.7–10.2)
Chloride: 100 mmol/L (ref 96–106)
Creatinine, Ser: 1.43 mg/dL — ABNORMAL HIGH (ref 0.76–1.27)
GFR calc Af Amer: 62 mL/min/{1.73_m2} (ref >59)
GFR calc non Af Amer: 54 mL/min/{1.73_m2} — ABNORMAL LOW (ref >59)
Glucose: 216 mg/dL — ABNORMAL HIGH (ref 65–99)
Potassium: 3.7 mmol/L (ref 3.5–5.2)
Sodium: 137 mmol/L (ref 134–144)

## 2018-05-05 NOTE — Telephone Encounter (Signed)
Patient returning call

## 2018-05-05 NOTE — Telephone Encounter (Signed)
-----  Message from [name], PA-C sent at 05/05/2018  7:13 AM EST ----- Renal function is slightly higher than recent check at [facility] (1.33).  Potassium is reasonable.  I would not escalate Lasix any further at this point.  Follow up as directed.

## 2018-05-05 NOTE — Telephone Encounter (Signed)
Attempted to call patient. LMTCB 2/11

## 2018-05-05 NOTE — Telephone Encounter (Signed)
Call to patient to review results and provider comments. Pt verbalized understanding and has no further questions at this time.   Advised pt to call for any further questions or concerns.

## 2018-05-18 ENCOUNTER — Other Ambulatory Visit: Payer: Self-pay | Admitting: Family

## 2018-05-26 DIAGNOSIS — G4733 Obstructive sleep apnea (adult) (pediatric): Secondary | ICD-10-CM | POA: Diagnosis not present

## 2018-05-26 DIAGNOSIS — E662 Morbid (severe) obesity with alveolar hypoventilation: Secondary | ICD-10-CM | POA: Diagnosis not present

## 2018-06-01 ENCOUNTER — Other Ambulatory Visit: Payer: Self-pay | Admitting: Internal Medicine

## 2018-06-02 ENCOUNTER — Ambulatory Visit (INDEPENDENT_AMBULATORY_CARE_PROVIDER_SITE_OTHER): Payer: 59

## 2018-06-02 DIAGNOSIS — I272 Pulmonary hypertension, unspecified: Secondary | ICD-10-CM

## 2018-06-02 MED ORDER — PERFLUTREN LIPID MICROSPHERE
1.0000 mL | INTRAVENOUS | Status: AC | PRN
  Administered 2018-06-02 – 2018-06-03 (×2): 2 mL via INTRAVENOUS

## 2018-06-03 MED ORDER — PERFLUTREN LIPID MICROSPHERE: 1.0000 mL | INTRAVENOUS | Status: AC | PRN

## 2018-06-04 ENCOUNTER — Telehealth: Payer: Self-pay | Admitting: *Deleted

## 2018-06-04 DIAGNOSIS — E1165 Type 2 diabetes mellitus with hyperglycemia: Secondary | ICD-10-CM | POA: Diagnosis not present

## 2018-06-04 DIAGNOSIS — I5042 Chronic combined systolic (congestive) and diastolic (congestive) heart failure: Secondary | ICD-10-CM

## 2018-06-04 DIAGNOSIS — I272 Pulmonary hypertension, unspecified: Secondary | ICD-10-CM

## 2018-06-04 DIAGNOSIS — Z794 Long term (current) use of insulin: Secondary | ICD-10-CM | POA: Diagnosis not present

## 2018-06-04 NOTE — Telephone Encounter (Signed)
-----  Message from Carl Mu, PA-C sent at 06/04/2018  7:06 AM EDT ----- Echo showed a pump function of 60-65% with low-normal right ventricular function with a right side pressure of 78 and a moderately dilated left atrium. We referred him to the advanced heart failure service at his last visit. I do not see that he has an appointment with them. Can we check on this?

## 2018-06-04 NOTE — Telephone Encounter (Signed)
No answer. Left message to call back.   Referral to Advanced Heart Failure clinic entered into Epic.   Message sent to scheduling.

## 2018-06-08 ENCOUNTER — Ambulatory Visit: Payer: 59 | Admitting: Internal Medicine

## 2018-06-09 ENCOUNTER — Other Ambulatory Visit: Payer: Self-pay | Admitting: Cardiovascular Disease

## 2018-06-09 NOTE — Telephone Encounter (Signed)
Refill Request.  

## 2018-06-15 ENCOUNTER — Ambulatory Visit: Payer: 59 | Admitting: Internal Medicine

## 2018-06-15 ENCOUNTER — Other Ambulatory Visit: Payer: Self-pay

## 2018-06-15 ENCOUNTER — Ambulatory Visit (INDEPENDENT_AMBULATORY_CARE_PROVIDER_SITE_OTHER): Payer: 59 | Admitting: Internal Medicine

## 2018-06-15 DIAGNOSIS — I2510 Atherosclerotic heart disease of native coronary artery without angina pectoris: Secondary | ICD-10-CM | POA: Diagnosis not present

## 2018-06-15 DIAGNOSIS — G4733 Obstructive sleep apnea (adult) (pediatric): Secondary | ICD-10-CM | POA: Diagnosis not present

## 2018-06-15 DIAGNOSIS — J45901 Unspecified asthma with (acute) exacerbation: Secondary | ICD-10-CM

## 2018-06-15 DIAGNOSIS — R0609 Other forms of dyspnea: Secondary | ICD-10-CM | POA: Diagnosis not present

## 2018-06-15 DIAGNOSIS — R0600 Dyspnea, unspecified: Secondary | ICD-10-CM

## 2018-06-15 MED ORDER — BECLOMETHASONE DIPROP HFA 80 MCG/ACT IN AERB: 2.0000 | INHALATION_SPRAY | Freq: Two times a day (BID) | RESPIRATORY_TRACT | 2 refills | Status: AC

## 2018-06-15 MED ORDER — MONTELUKAST SODIUM 10 MG PO TABS: 10.0000 mg | ORAL_TABLET | Freq: Every day | ORAL | 3 refills | Status: DC

## 2018-06-15 NOTE — Progress Notes (Signed)
[facility]      Virtual Visit via Telephone Note  I connected with [name] on 06/15/18 at  8:30 AM EDT by telephone and verified that I am speaking with the correct person using two identifiers.   I discussed the limitations, risks, security and privacy concerns of performing an evaluation and management service by telephone and the availability of in person appointments. I also discussed with the patient that there may be a patient responsible charge related to this service. The patient expressed understanding and agreed to proceed.  See the note below.    I discussed the assessment and treatment plan with the patient. The patient was provided an opportunity to ask questions and all were answered. The patient agreed with the plan and demonstrated an understanding of the instructions.   The patient was advised to call back or seek an in-person evaluation if the symptoms worsen or if the condition fails to improve as anticipated.  I provided 25 minutes of non-face-to-face time during this encounter.   Laverle Hobby, MD       Assessment and Plan:  Persistent asthma, with history of exacerbations. - Patient is reminded today to use his Qvar.  Allergic rhinitis. -Persistent nasal allergies particular in the spring and summertime.  We discussed that nasal drainage can contribute to asthma flareups, therefore it is important to treat this going forwards. -Continue Singulair nightly. --Start taking claritin daily, in addition to nasonex.   Obstructive sleep apnea with obesity hypoventilation. -Doing very well on BiPAP 14/9, encouraged to continue. -Continue diuresis.  Meds ordered this encounter  Medications  . montelukast (SINGULAIR) 10 MG tablet    Sig: Take 1 tablet (10 mg total) by mouth at bedtime.    Dispense:  90 tablet    Refill:  3  . beclomethasone (QVAR REDIHALER) 80 MCG/ACT inhaler    Sig: Inhale 2 puffs into the lungs 2 (two)  times daily. Rinse mouth after use.    Dispense:  1 Inhaler    Refill:  2     Follow up in 6 months.     Date: 06/15/2018  MRN# [id number] [name] 29-Jun-1960    [name] is a 58 y.o. old male seen in consultation for chief complaint of:      HPI:   Synopsis: Mr. Ballo was previously followed by Dr. Halford Chessman for asthma and obesity hypoventilation syndrome with OSA.  History of renal transplant.  She has been maintained on BiPAP for OHS, and Qvar for asthma.  At last visit the patient was recovering from a recent asthma exacerbation, and was on a prednisone taper.  It was noted that he would not really been using Qvar regularly, therefore this was reinforced. Today he notes that he has continued to do well well. He is has albuterol but he has not used it in "a while". He does not use any regular inhalers currently.  He has continued to use bipap every night and is more awake during the day. He remains on singulair every night. But he does continue to have allergy symptoms cough coughing, runny nose.  He does not take any antihistamines currently.    He is not a smoker.  He denies reflux he does have sinus drainage is on nasonex.  He has never been tested for allergies, he notes that spring and fall come out he has a lot of nasal and eye congestion.   **Echocardiogram 06/02/2018>> EF is 60%. **Chest x-ray 08/13/2017>>  imaging personally reviewed, there is cardiomegaly with mild interstitial edema. **CBC 08/13/2017>> absolute eosinophil count 100. **IgE 08/13/2017>> less than 2. **Review of download data 05/12/2018-06/10/2018>> tracings personally reviewed usage greater than 4 hours is 29/30 days.  Average usage on days used is 6 hours 22 minutes.  Setting is 14/9.  Leaks are within normal limits.  Residual AHI is 2.  Overall this shows very good compliance with excellent control of obstructive sleep apnea. **Review of download data 30 days as of 08/12/2017.  Uses greater than 4 hours  is 29/30 days.  Average usage on days used 7 hours 12 minutes.  Settings is 14/9.  Residual AHI is 2.6.  Overall this shows excellent compliance with CPAP, with excellent control of obstructive sleep apnea on BiPAP at 14/9.  PSG 03/15/12 >> AHI 57.9, low SpO2 78%. CPAP 12 cm H2O  CT chest [facility]) 09/24/12 >> central bronchial thickening, LUL pleural scarring  V/Q scan [facility]) 09/24/12 >> matched defect Lt upper lobe  ABG [facility]) 09/25/12 >> pH 7.40, PaCO2 68.2, PaO2 61.5  Echo bubble study [facility]) 09/26/12 >> poor study, no identified shunt  Room air SpO2 10/27/12 >> 87%, start 2 liters oxygen 24/7  ONO with CPAP and RA 11/02/12 >> Test time 1 hr 22 min. Mean SpO2 76%, low SpO2 59%. Spent 1 hr 16 min with SpO2 < 88%.  CPAP 10/15/12 to 11/13/12 >> used on 30 of 30 nights with average 4 hrs 22 min. Average AHI 3.8 with CPAP 12 cm H2O.  PFT 11/27/12 >> FEV1 1.74, FEV1% 80%, TLC 4.16 (60%), DLCO 81%, +BD  CPAP 11/16/12 to 12/15/12 >> Used on 30 of 30 nights with average 3 hrs 56 min. Average AHI 3.7 with CPAP 12 cm H2O.  ONO with CPAP and 2 liters 11/27/12 >> Test time 4 hrs 39 min. Basal SpO2 88%, low SpO2 67%. Spent 117 min with SpO2 < 88%  Echo 04/27/13 >> EF 35 to 40%, mod LVH, grade 2 diastolic dysfunction, severe LA dilation, mod RA dilation  ABG on room air 05/12/13 >> pH 7.41, PaCO2 70, PaO2 < 43  BiPAP 06/21/13 >> 14/9 cm H2O with 2 liters oxygen >> AHI 0.   Medication:    Current Outpatient Medications:  .  albuterol (PROVENTIL HFA;VENTOLIN HFA) 108 (90 Base) MCG/ACT inhaler, Inhale 2 puffs every 6 (six) hours as needed into the lungs for wheezing or shortness of breath., Disp: 1 Inhaler, Rfl: 2 .  AMBULATORY NON FORMULARY MEDICATION, Medication Name: Nebulizer, Disp: 1 each, Rfl: 0 .  amLODipine (NORVASC) 5 MG tablet, Take 1 tablet (5 mg total) by mouth daily., Disp: 90 tablet, Rfl: 3 .  beclomethasone (QVAR) 80 MCG/ACT inhaler, Inhale 2 puffs into the lungs 2 (two) times daily. (Patient  taking differently: Inhale 2 puffs into the lungs 2 (two) times daily as needed. ), Disp: 1 Inhaler, Rfl: 6 .  buPROPion (WELLBUTRIN SR) 150 MG 12 hr tablet, Take 1 tablet (150 mg total) by mouth 2 (two) times daily., Disp: 60 tablet, Rfl: 3 .  colchicine 0.6 MG tablet, Take 1 tablet (0.6 mg total) by mouth 2 (two) times daily as needed., Disp: 60 tablet, Rfl: 1 .  ELIQUIS 5 MG TABS tablet, TAKE 1 TABLET (5 MG TOTAL) BY MOUTH 2 (TWO) TIMES DAILY., Disp: 180 tablet, Rfl: 1 .  furosemide (LASIX) 40 MG tablet, TAKE 1 TABLET BY MOUTH 2 TIMES DAILY. TAKE AN ADDITIONAL 80MG IN THE PM AS NEEDED FOR WEIGHT GAIN., Disp: 240 tablet,  Rfl: 3 .  insulin aspart (NOVOLOG) 100 UNIT/ML injection, Inject 1-5 Units into the skin once. Using as sliding scale. Takes 1 unit if is 10 above normal, 5 units if is 50 above normal. Upper limit of normal for him is 120., Disp: , Rfl:  .  insulin glargine (LANTUS) 100 unit/mL SOPN, Inject 0.1 mLs (10 Units total) into the skin at bedtime. Currently using 15 units (Patient taking differently: Inject 10 Units into the skin at bedtime. Currently using 10 units), Disp: 15 mL, Rfl: 11 .  lisinopril (PRINIVIL,ZESTRIL) 40 MG tablet, TAKE 1 TABLET BY MOUTH DAILY, Disp: 30 tablet, Rfl: 2 .  magnesium 30 MG tablet, Take 30 mg by mouth daily., Disp: , Rfl:  .  metolazone (ZAROXOLYN) 5 MG tablet, TAKE 1 TABLET BY MOUTH AS NEEDED, Disp: 30 tablet, Rfl: 3 .  mometasone (NASONEX) 50 MCG/ACT nasal spray, PLACE 2 SPRAYS INTO THE NOSE AT BEDTIME., Disp: 17 g, Rfl: 11 .  montelukast (SINGULAIR) 10 MG tablet, TAKE 1 TABLET BY MOUTH AT BEDTIME, Disp: 30 tablet, Rfl: 0 .  Multiple Vitamin (MULTIVITAMIN WITH MINERALS) TABS tablet, Take 1 tablet by mouth daily., Disp: , Rfl:  .  mycophenolate (MYFORTIC) 180 MG EC tablet, Take 360 mg by mouth 2 (two) times daily. , Disp: , Rfl:  .  omeprazole (PRILOSEC) 20 MG capsule, TAKE 1 CAPSULE BY MOUTH ONCE DAILY, Disp: 90 capsule, Rfl: 3 .  potassium chloride  (MICRO-K) 10 MEQ CR capsule, TAKE 2 CAPSULES (20 MEQ TOTAL) BY MOUTH 2 (TWO) TIMES DAILY., Disp: 360 capsule, Rfl: 4 .  rosuvastatin (CRESTOR) 10 MG tablet, TAKE 1 TABLET BY MOUTH DAILY, Disp: 90 tablet, Rfl: 3 .  sildenafil (VIAGRA) 100 MG tablet, TAKE 0.5-1 TABLETS (50-100 MG TOTAL) BY MOUTH DAILY AS NEEDED FOR ERECTILE DYSFUNCTION., Disp: 10 tablet, Rfl: 11 .  spironolactone (ALDACTONE) 25 MG tablet, Take 1 tablet (25 mg total) by mouth daily., Disp: 30 tablet, Rfl: 11 .  tacrolimus (PROGRAF) 0.5 MG capsule, Take 1 mg by mouth 2 (two) times daily., Disp: , Rfl:  .  tamsulosin (FLOMAX) 0.4 MG CAPS capsule, Take 0.4 mg by mouth daily. , Disp: , Rfl:    Allergies:  Morphine   Review of Systems:  Constitutional: Feels well. Cardiovascular: Denies chest pain, exertional chest pain.  Pulmonary: Denies hemoptysis, pleuritic chest pain.   The remainder of systems were reviewed and were found to be negative other than what is documented in the HPI.      LABORATORY PANEL:   CBC No results for input(s): WBC, HGB, HCT, PLT in the last 168 hours. ------------------------------------------------------------------------------------------------------------------  Chemistries  No results for input(s): NA, K, CL, CO2, GLUCOSE, BUN, CREATININE, CALCIUM, MG, AST, ALT, ALKPHOS, BILITOT in the last 168 hours.  Invalid input(s): GFRCGP ------------------------------------------------------------------------------------------------------------------  Cardiac Enzymes No results for input(s): TROPONINI in the last 168 hours. ------------------------------------------------------------  RADIOLOGY:  No results found.     Thank  you for the consultation and for allowing [facility] to assist in the care of your patient. Our recommendations are noted above.  Please contact us if we can be of further service.  Marda Stalker, M.D., F.C.C.P.  Board Certified in Internal  Medicine, Pulmonary Medicine, [place], and Sleep Medicine.  [facility] Office Number: [phone]  06/15/2018

## 2018-06-16 NOTE — Telephone Encounter (Signed)
Attempted to call the patient- I left a message of the patient to please call back for results.

## 2018-06-18 ENCOUNTER — Encounter (HOSPITAL_COMMUNITY): Payer: Self-pay | Admitting: *Deleted

## 2018-06-18 NOTE — Telephone Encounter (Signed)
I spoke with the patient regarding his echo results. He voices understanding.  He states he has not heard back from the Pulmonary HTN clinic regarding an appointment.  I advised that in light of the COVID-19 situation that we are trying to limit patient's coming in to the office/ hospital environments as much as possible.  The patient is aware that his referral has been placed and I will reach out to the nurse at the Pulmonary HTN clinic to make sure he doesn't get lost to follow up during this pandemic.   The patient voices understanding and is agreeable.

## 2018-06-18 NOTE — Telephone Encounter (Signed)
Spoke w/pt, he is agreeable to a Tele-health visit, sch for 3/27 at 9:30 with Dr Aundra Dubin

## 2018-06-19 ENCOUNTER — Other Ambulatory Visit: Payer: Self-pay

## 2018-06-19 ENCOUNTER — Ambulatory Visit (HOSPITAL_COMMUNITY)
Admission: RE | Admit: 2018-06-19 | Discharge: 2018-06-19 | Disposition: A | Discharge status: Home / Self Care | Payer: Medicare Other | Source: Ambulatory Visit | Attending: Cardiology | Admitting: Cardiology

## 2018-06-19 ENCOUNTER — Encounter (HOSPITAL_COMMUNITY): Payer: Self-pay

## 2018-06-19 DIAGNOSIS — I130 Hypertensive heart and chronic kidney disease with heart failure and stage 1 through stage 4 chronic kidney disease, or unspecified chronic kidney disease: Secondary | ICD-10-CM | POA: Diagnosis not present

## 2018-06-19 DIAGNOSIS — I2510 Atherosclerotic heart disease of native coronary artery without angina pectoris: Secondary | ICD-10-CM | POA: Diagnosis not present

## 2018-06-19 DIAGNOSIS — G4733 Obstructive sleep apnea (adult) (pediatric): Secondary | ICD-10-CM | POA: Diagnosis not present

## 2018-06-19 DIAGNOSIS — I5042 Chronic combined systolic (congestive) and diastolic (congestive) heart failure: Secondary | ICD-10-CM

## 2018-06-19 DIAGNOSIS — I272 Pulmonary hypertension, unspecified: Secondary | ICD-10-CM

## 2018-06-19 DIAGNOSIS — Z79899 Other long term (current) drug therapy: Secondary | ICD-10-CM

## 2018-06-19 DIAGNOSIS — N183 Chronic kidney disease, stage 3 (moderate): Secondary | ICD-10-CM | POA: Diagnosis not present

## 2018-06-19 DIAGNOSIS — I482 Chronic atrial fibrillation, unspecified: Secondary | ICD-10-CM

## 2018-06-19 DIAGNOSIS — I5032 Chronic diastolic (congestive) heart failure: Secondary | ICD-10-CM

## 2018-06-19 MED ORDER — TORSEMIDE 20 MG PO TABS: 60.0000 mg | ORAL_TABLET | Freq: Every day | ORAL | 3 refills | Status: DC

## 2018-06-19 NOTE — Patient Instructions (Signed)
Lab work will need to be done in 1 week. This will be done at your primary care office. Please call them to set up a visit. They should have to order already.  STOP Furosemide (Lasix)  START Torsemide 64m (3 tabs) daily. This has been sent in as a 90 day supply  You have a telehealth follow up visit scheduled for 07/03/18 at 2:40pm.

## 2018-06-21 NOTE — Progress Notes (Addendum)
Heart Failure TeleHealth Note  Due to national recommendations of social distancing due to [place] 19, Audio/video telehealth visit is felt to be most appropriate for this patient at this time.  See MyChart message from today for patient consent regarding telehealth for [facility].  Date:  06/21/2018   ID:  [name], [name] 08-25-1960, MRN [id number]  Location: Home  Provider location: [address] [place] Type of Visit:  Established patient  PCP:  [name], MD  Cardiologist:  [name] Primary HF: [name]  Chief Complaint: Shortness of breath   History of Present Illness: [name] is a 58 y.o. male referred by [name] for evaluation of CHF who presents via audio/video conferencing for a new patient telehealth visit today.     Today,  he denies symptoms of cough, fevers, chills, or new SOB worrisome for COVID 19.    Patient has history of CAD s/p CABG 2005, chronic atrial fibrillation on Eliquis, ESRD s/p renal transplant 2012, anc chronic diastolic CHF.  His renal function has been fairly stable, most recent creatinine 1.43.  He has been struggling with volume overload/CHF.  He has a history of ischemic cardiomyopathy with EF as low as 35-40% in the past but last echo in 3/20 showed EF 60-65%.  There is evidence for RV failure with pulmonary hypertension by echo.    He has been on Lasix for a long time, but feels like it is not working as well.  He has take metolazone once a week for the last 2 wks due to perception of volume overload.  Weight has come down some.  He is short of breath walking up an incline or doing more strenuous activity. No dyspnea walking on flat ground. He is using his bipap nightly.  No orthopnea/PND.  +Bendopnea.  Occasional mild lightheadedness when he stands up but BP at home has been ok, most recently 140/89 on his check.  HR in 50s-60s.   ECG (personally reviewed, from 2/20): atrial fibrillation, IVCD 166 msec  Labs  (7/19): LDL 49 Labs (2/20): K 3.7, creatinine 1.43  PMH: 1. CAD: s/p CABG x 4 in [place] in 2005.  - Cardiolite (1/20): EF 47%, small fixed apical septal defect, no ischemia.  Low risk study.  2. Asthma 3. Gout 4. Atrial fibrillation: Chronic.  5. ESRD s/p renal transplantation at [facility] in 2012.  6. Anemia of chronic disease 7. Type II diabetes 8. HTN 9. Hyperlipidemia 10. OSA: On bipap.  11. Ischemic cardiomyopathy: EF 35-40% in past.  - Cardiolite (1/20) with EF 47%.  - Echo (1/20): EF 50-55%, mild MR, RV moderately dilated with moderately decreased systolic function, PASP 73 mmHg, moderate TR.   - Echo (3/20): EF 60-65%, PASP 75 mmHg, mildly dilated RV with mildly decreased systolic function.  12. Obesity: s/p gastric bypass in 2015.  13. Bradycardia with beta blocker.   Past Surgical History:  Procedure Laterality Date  . BARIATRIC SURGERY    . CARDIAC CATHETERIZATION     [facility]  . CATARACT EXTRACTION W/PHACO Right 10/29/2017   Procedure: CATARACT EXTRACTION PHACO AND INTRAOCULAR LENS PLACEMENT ([place])  RIGHT DIABETIC;  Surgeon: [name], MD;  Location: [place];  Service: Ophthalmology;  Laterality: Right;  Diabetic - insulin  . CATARACT EXTRACTION W/PHACO Left 11/18/2017   Procedure: CATARACT EXTRACTION PHACO AND INTRAOCULAR LENS PLACEMENT ([place]) LEFT IVA/TOPICAL;  Surgeon: [name], MD;  Location: [place];  Service: Ophthalmology;  Laterality: Left;  DIABETES - insulin sleep apnea  . COLONOSCOPY WITH PROPOFOL N/A 04/22/2013   Procedure: COLONOSCOPY WITH PROPOFOL;  Surgeon: [name], MD;  Location: [facility];  Service: Endoscopy;  Laterality: N/A;  . CORONARY ARTERY BYPASS GRAFT  2005   X 4  . DG ANGIO AV SHUNT*L*     right and left upper arms  . FASCIOTOMY  03/03/2012   Procedure: FASCIOTOMY;  Surgeon: [name], MD;  Location: [place];  Service: Orthopedics;  Laterality: Right;  FASCIOTOMY RIGHT  SMALL FINGER  . FASCIOTOMY Left 08/17/2013   Procedure: FASCIOTOMY LEFT RING;  Surgeon: [name], MD;  Location: [place];  Service: Orthopedics;  Laterality: Left;  . INCISION AND DRAINAGE ABSCESS Left 10/15/2015   Procedure: INCISION AND DRAINAGE ABSCESS;  Surgeon: [name], MD;  Location: [facility];  Service: General;  Laterality: Left;  . KIDNEY TRANSPLANT  09/13/2010   cadaver--at [facility]  . RIGHT HEART CATH N/A 11/15/2016   Procedure: RIGHT HEART CATH;  Surgeon: [name], MD;  Location: [facility] [facility];  Service: Cardiovascular;  Laterality: N/A;  . TYMPANIC MEMBRANE REPAIR  1/12   left  . VASECTOMY       Current Outpatient Medications  Medication Sig Dispense Refill  . albuterol (PROVENTIL HFA;VENTOLIN HFA) 108 (90 Base) MCG/ACT inhaler Inhale 2 puffs every 6 (six) hours as needed into the lungs for wheezing or shortness of breath. 1 Inhaler 2  . AMBULATORY NON FORMULARY MEDICATION Medication Name: Nebulizer 1 each 0  . amLODipine (NORVASC) 5 MG tablet Take 1 tablet (5 mg total) by mouth daily. 90 tablet 3  . beclomethasone (QVAR REDIHALER) 80 MCG/ACT inhaler Inhale 2 puffs into the lungs 2 (two) times daily. Rinse mouth after use. 1 Inhaler 2  . beclomethasone (QVAR) 80 MCG/ACT inhaler Inhale 2 puffs into the lungs 2 (two) times daily. (Patient taking differently: Inhale 2 puffs into the lungs 2 (two) times daily as needed. ) 1 Inhaler 6  . buPROPion (WELLBUTRIN SR) 150 MG 12 hr tablet Take 1 tablet (150 mg total) by mouth 2 (two) times daily. 60 tablet 3  . colchicine 0.6 MG tablet Take 1 tablet (0.6 mg total) by mouth 2 (two) times daily as needed. 60 tablet 1  . ELIQUIS 5 MG TABS tablet TAKE 1 TABLET (5 MG TOTAL) BY MOUTH 2 (TWO) TIMES DAILY. 180 tablet 1  . insulin aspart (NOVOLOG) 100 UNIT/ML injection Inject 1-5 Units into the skin once. Using as sliding scale. Takes 1 unit if is 10 above normal, 5 units if is 50 above normal. Upper limit of  normal for him is 120.    Marland Kitchen insulin glargine (LANTUS) 100 unit/mL SOPN Inject 0.1 mLs (10 Units total) into the skin at bedtime. Currently using 15 units (Patient taking differently: Inject 10 Units into the skin at bedtime. Currently using 10 units) 15 mL 11  . lisinopril (PRINIVIL,ZESTRIL) 40 MG tablet TAKE 1 TABLET BY MOUTH DAILY 30 tablet 2  . magnesium 30 MG tablet Take 30 mg by mouth daily.    . metolazone (ZAROXOLYN) 5 MG tablet TAKE 1 TABLET BY MOUTH AS NEEDED 30 tablet 3  . mometasone (NASONEX) 50 MCG/ACT nasal spray PLACE 2 SPRAYS INTO THE NOSE AT BEDTIME. 17 g 11  . montelukast (SINGULAIR) 10 MG tablet TAKE 1 TABLET BY MOUTH AT BEDTIME 30 tablet 0  . montelukast (SINGULAIR) 10 MG tablet Take 1 tablet (10 mg  total) by mouth at bedtime. 90 tablet 3  . Multiple Vitamin (MULTIVITAMIN WITH MINERALS) TABS tablet Take 1 tablet by mouth daily.    . mycophenolate (MYFORTIC) 180 MG EC tablet Take 360 mg by mouth 2 (two) times daily.     Marland Kitchen omeprazole (PRILOSEC) 20 MG capsule TAKE 1 CAPSULE BY MOUTH ONCE DAILY 90 capsule 3  . potassium chloride (MICRO-K) 10 MEQ CR capsule TAKE 2 CAPSULES (20 MEQ TOTAL) BY MOUTH 2 (TWO) TIMES DAILY. 360 capsule 4  . rosuvastatin (CRESTOR) 10 MG tablet TAKE 1 TABLET BY MOUTH DAILY 90 tablet 3  . sildenafil (VIAGRA) 100 MG tablet TAKE 0.5-1 TABLETS (50-100 MG TOTAL) BY MOUTH DAILY AS NEEDED FOR ERECTILE DYSFUNCTION. 10 tablet 11  . spironolactone (ALDACTONE) 25 MG tablet Take 1 tablet (25 mg total) by mouth daily. 30 tablet 11  . tacrolimus (PROGRAF) 0.5 MG capsule Take 1 mg by mouth 2 (two) times daily.    . tamsulosin (FLOMAX) 0.4 MG CAPS capsule Take 0.4 mg by mouth daily.     Marland Kitchen torsemide (DEMADEX) 20 MG tablet Take 3 tablets (60 mg total) by mouth daily. 270 tablet 3   No current facility-administered medications for this encounter.     Allergies:   Morphine   Social History:  The patient  reports that he quit smoking about 23 years ago. His smoking use  included cigars. He has never used smokeless tobacco. He reports current alcohol use. He reports that he does not use drugs.   Family History:  The patient's family history includes Breast cancer in his maternal grandmother; Diabetes in his maternal grandmother; Heart disease in his father; Kidney disease in his father; Kidney failure in his father; Liver cancer in his paternal grandmother and paternal uncle; Valvular heart disease in his mother.   ROS:  Please see the history of present illness.   All other systems are personally reviewed and negative.   Exam:  (Video/Tele Health Call; Exam is subjective and or/visual.) BP 140/89, P 60 (patient check) General:  Well appearing. No resp difficulty. HEENT: Normal Neck: JVP 12-14 observing on camera Lungs: Normal respiratory effort with conversation.  Abdomen: Non-distended. Pt denies tenderness with self palpation.  Extremities: 1+ edema to knees Neuro: Alert & oriented x 3.   Recent Labs: 08/22/2017: Magnesium 2.1 04/02/2018: Hemoglobin 11.0; Platelets 188 05/04/2018: BUN 30; Creatinine, Ser 1.43; Potassium 3.7; Sodium 137  Personally reviewed   Wt Readings from Last 3 Encounters:  05/04/18 120.7 kg (266 lb)  04/29/18 119.7 kg (264 lb)  04/02/18 121.5 kg (267 lb 12 oz)      ASSESSMENT AND PLAN:  1. Chronic diastolic CHF: With prominent RV failure.  Echo in 3/20 with EF 60-65%, mildly dilated RV with mildly decreased systolic function, severe pulmonary hypertension.  On exam, he is volume overloaded (JVD, edema observable by video). NYHA class II symptoms but somewhat worse than in the past.  Will need to be careful with diuresis given history of renal transplantation.  - I will stop Lasix and start him on torsemide 60 mg daily.  He will need BMET in 1 week.  - He will need eventual RHC.  We discussed this today, will plan to do it when COVID restrictions relaxed.   - Continue current lisinopril and spironolactone.  2. Atrial  fibrillation: Chronic.  He will continue Eliquis. Not on beta blocker with history of bradycardia.  Rate is controlled.  3. CAD: S/p CABG 2005.  Low risk Cardiolite in 1/20.   -  No ASA given Eliquis use.  - Continue Crestor 10 mg daily, good lipids in 7/19.  4. CKD: Stage 3.  S/p renal transplantation in 2012. He remains on mycophenolate and tacrolimus.  - Follow creatinine closely with diuresis.  5. OSA: On Bipap.  6. HTN: BP mildly elevated, follow for now.   COVID screen The patient does not have any symptoms that suggest any further testing/ screening at this time.  Social distancing reinforced today.  Recommended follow-up:  2 wks, telehealth  Relevant cardiac medications were reviewed at length with the patient today.   The patient does not have concerns regarding their medications at this time.   Patient Risk: After full review of this patients clinical status, I feel that they are at moderate risk for cardiac decompensation at this time.  Today, I have spent 24 minutes with the patient with telehealth technology discussing CHF .    Signed, Loralie Champagne, MD  06/21/2018  [facility] [address] [facility] [place] [address] [phone] (office) [phone] (fax)

## 2018-06-22 ENCOUNTER — Telehealth: Payer: Self-pay | Admitting: Cardiology

## 2018-06-22 DIAGNOSIS — I5032 Chronic diastolic (congestive) heart failure: Secondary | ICD-10-CM

## 2018-06-22 NOTE — Telephone Encounter (Signed)
Pt would like to have labs, please change orders to medical mall [facility]

## 2018-06-22 NOTE — Telephone Encounter (Signed)
Spoke with the [name]. Adv him that I have updated the lab order (bmet) requested by [name]. Adv the [name] that he can go to the medical mall [facility] on any [facility] location to have his labs drawn.

## 2018-06-24 ENCOUNTER — Other Ambulatory Visit
Admission: RE | Admit: 2018-06-24 | Discharge: 2018-06-24 | Disposition: A | Discharge status: Home / Self Care | Payer: 59 | Source: Ambulatory Visit | Attending: Cardiology | Admitting: Cardiology

## 2018-06-24 DIAGNOSIS — I5032 Chronic diastolic (congestive) heart failure: Secondary | ICD-10-CM | POA: Insufficient documentation

## 2018-06-24 LAB — BASIC METABOLIC PANEL
Anion gap: 7 (ref 5–15)
BUN: 26 mg/dL — ABNORMAL HIGH (ref 6–20)
CO2: 29 mmol/L (ref 22–32)
Calcium: 9.4 mg/dL (ref 8.9–10.3)
Chloride: 103 mmol/L (ref 98–111)
Creatinine, Ser: 1.24 mg/dL (ref 0.61–1.24)
GFR calc Af Amer: >60 mL/min (ref >60)
GFR calc non Af Amer: >60 mL/min (ref >60)
Glucose, Bld: 252 mg/dL — ABNORMAL HIGH (ref 70–99)
Potassium: 4.3 mmol/L (ref 3.5–5.1)
Sodium: 139 mmol/L (ref 135–145)

## 2018-06-24 NOTE — Addendum Note (Signed)
Addended by: Santiago Bur on: 06/24/2018 09:43 AM   Modules accepted: Orders

## 2018-06-26 DIAGNOSIS — E662 Morbid (severe) obesity with alveolar hypoventilation: Secondary | ICD-10-CM | POA: Diagnosis not present

## 2018-06-26 DIAGNOSIS — G4733 Obstructive sleep apnea (adult) (pediatric): Secondary | ICD-10-CM | POA: Diagnosis not present

## 2018-06-30 ENCOUNTER — Telehealth: Payer: Self-pay | Admitting: *Deleted

## 2018-06-30 ENCOUNTER — Other Ambulatory Visit: Payer: Self-pay

## 2018-06-30 ENCOUNTER — Encounter: Payer: Self-pay | Admitting: Internal Medicine

## 2018-06-30 ENCOUNTER — Ambulatory Visit (INDEPENDENT_AMBULATORY_CARE_PROVIDER_SITE_OTHER): Payer: 59 | Admitting: Internal Medicine

## 2018-06-30 VITALS — BP 140/87 | Ht 70.5 in | Wt 258.0 lb

## 2018-06-30 DIAGNOSIS — I5032 Chronic diastolic (congestive) heart failure: Secondary | ICD-10-CM | POA: Diagnosis not present

## 2018-06-30 DIAGNOSIS — I2510 Atherosclerotic heart disease of native coronary artery without angina pectoris: Secondary | ICD-10-CM

## 2018-06-30 DIAGNOSIS — Z794 Long term (current) use of insulin: Secondary | ICD-10-CM

## 2018-06-30 DIAGNOSIS — F320 Major depressive disorder, single episode, mild: Secondary | ICD-10-CM | POA: Diagnosis not present

## 2018-06-30 DIAGNOSIS — E1121 Type 2 diabetes mellitus with diabetic nephropathy: Secondary | ICD-10-CM

## 2018-06-30 NOTE — Telephone Encounter (Signed)
Consent sent through MyChart as well

## 2018-06-30 NOTE — Assessment & Plan Note (Signed)
Last A1c up to 9.5% Discussed that lifestyle changes that go along with worsening diabetes control could be a factor in his concentration problems Urged him to work harder on healthy lifestyle

## 2018-06-30 NOTE — Telephone Encounter (Signed)
Call placed to the patient about switching his office visit to a web visit. The patient stated that this would be fine but would prefer consent and instructions be sent to his email address.  The appointment will stay 4/9 at 9:40      TELEPHONE CALL NOTE  [name] has been deemed a candidate for a follow-up tele-health visit to limit community exposure during the Covid-19 pandemic. I spoke with the patient via phone to ensure availability of phone/video source, confirm preferred email & phone number, and discuss instructions and expectations.  I reminded [name] to be prepared with any vital sign and/or heart rhythm information that could potentially be obtained via home monitoring, at the time of his visit. I reminded [name] to expect a phone call at the time of his visit if his visit.  Did the patient verbally acknowledge consent to treatment? Consent sent via his email  Ricci Barker, RN 06/30/2018 3:25 PM   DOWNLOADING [place], go to [employer] and type in WebEx in the search bar. [place] [employer], the blue/green circle. The app is free but as with any other app downloads, their phone may require them to verify saved payment information or Apple password. The patient does NOT have to create an account.  - If Android, ask patient to go to [employer] and type in WebEx in the search bar. [place] [employer], the blue/green circle. The app is free but as with any other app downloads, their phone may require them to verify saved payment information or Android password. The patient does NOT have to create an account.   CONSENT FOR TELE-HEALTH VISIT - PLEASE REVIEW  I hereby voluntarily request, consent and authorize [facility] and its employed or contracted physicians, physician assistants, nurse practitioners or other licensed health care professionals (the Practitioner), to provide me with  telemedicine health care services (the "Services") as deemed necessary by the treating Practitioner. I acknowledge and consent to receive the Services by the Practitioner via telemedicine. I understand that the telemedicine visit will involve communicating with the Practitioner through live audiovisual communication technology and the disclosure of certain medical information by electronic transmission. I acknowledge that I have been given the opportunity to request an in-person assessment or other available alternative prior to the telemedicine visit and am voluntarily participating in the telemedicine visit.  I understand that I have the right to withhold or withdraw my consent to the use of telemedicine in the course of my care at any time, without affecting my right to future care or treatment, and that the Practitioner or I may terminate the telemedicine visit at any time. I understand that I have the right to inspect all information obtained and/or recorded in the course of the telemedicine visit and may receive copies of available information for a reasonable fee.  I understand that some of the potential risks of receiving the Services via telemedicine include:  Marland Kitchen Delay or interruption in medical evaluation due to technological equipment failure or disruption; . Information transmitted may not be sufficient (e.g. poor resolution of images) to allow for appropriate medical decision making by the Practitioner; and/or  . In rare instances, security protocols could fail, causing a breach of personal health information.  Furthermore, I acknowledge that it is my responsibility to provide information about my medical history, conditions and care that is complete and accurate to the best of my  ability. I acknowledge that Practitioner's advice, recommendations, and/or decision may be based on factors not within their control, such as incomplete or inaccurate data provided by me or distortions of diagnostic  images or specimens that may result from electronic transmissions. I understand that the practice of medicine is not an exact science and that Practitioner makes no warranties or guarantees regarding treatment outcomes. I acknowledge that I will receive a copy of this consent concurrently upon execution via email to the email address I last provided but may also request a printed copy by calling the office of [place].    I understand that my insurance will be billed for this visit.   I have read or had this consent read to me. . I understand the contents of this consent, which adequately explains the benefits and risks of the Services being provided via telemedicine.  . I have been provided ample opportunity to ask questions regarding this consent and the Services and have had my questions answered to my satisfaction. . I give my informed consent for the services to be provided through the use of telemedicine in my medical care  By participating in this telemedicine visit I agree to the above.

## 2018-06-30 NOTE — Progress Notes (Signed)
Subjective:    Patient ID: [name], male    DOB: 20-Apr-1960, 58 y.o.   MRN: [id number]  HPI Virtual visit for review of depression and mood issues Identification done and discussed billing  Still having problems with his concentration Still can't finish one project before going to the next  Not working still Wife is working Mood is better but still with concentration problems---goes from one project to another. "I was never like that"  Some anxiety with the COVID Not really anxious otherwise He feels the depression is better since increasing the bupropion  Has started doing Tai Chi Only the second week---does enjoy it  Current Outpatient Medications on File Prior to Visit  Medication Sig Dispense Refill  . albuterol (PROVENTIL HFA;VENTOLIN HFA) 108 (90 Base) MCG/ACT inhaler Inhale 2 puffs every 6 (six) hours as needed into the lungs for wheezing or shortness of breath. 1 Inhaler 2  . AMBULATORY NON FORMULARY MEDICATION Medication Name: Nebulizer 1 each 0  . amLODipine (NORVASC) 5 MG tablet Take 1 tablet (5 mg total) by mouth daily. 90 tablet 3  . beclomethasone (QVAR REDIHALER) 80 MCG/ACT inhaler Inhale 2 puffs into the lungs 2 (two) times daily. Rinse mouth after use. 1 Inhaler 2  . beclomethasone (QVAR) 80 MCG/ACT inhaler Inhale 2 puffs into the lungs 2 (two) times daily. (Patient taking differently: Inhale 2 puffs into the lungs 2 (two) times daily as needed. ) 1 Inhaler 6  . buPROPion (WELLBUTRIN SR) 150 MG 12 hr tablet Take 1 tablet (150 mg total) by mouth 2 (two) times daily. 60 tablet 3  . colchicine 0.6 MG tablet Take 1 tablet (0.6 mg total) by mouth 2 (two) times daily as needed. 60 tablet 1  . ELIQUIS 5 MG TABS tablet TAKE 1 TABLET (5 MG TOTAL) BY MOUTH 2 (TWO) TIMES DAILY. 180 tablet 1  . insulin aspart (NOVOLOG) 100 UNIT/ML injection Inject 1-5 Units into the skin once. Using as sliding scale. Takes 1 unit if is 10 above normal, 5 units if is 50 above normal.  Upper limit of normal for him is 120.    Marland Kitchen insulin glargine (LANTUS) 100 unit/mL SOPN Inject 0.1 mLs (10 Units total) into the skin at bedtime. Currently using 15 units (Patient taking differently: Inject 10 Units into the skin at bedtime. Currently using 10 units) 15 mL 11  . lisinopril (PRINIVIL,ZESTRIL) 40 MG tablet TAKE 1 TABLET BY MOUTH DAILY 30 tablet 2  . magnesium 30 MG tablet Take 30 mg by mouth daily.    . metolazone (ZAROXOLYN) 5 MG tablet TAKE 1 TABLET BY MOUTH AS NEEDED 30 tablet 3  . mometasone (NASONEX) 50 MCG/ACT nasal spray PLACE 2 SPRAYS INTO THE NOSE AT BEDTIME. 17 g 11  . montelukast (SINGULAIR) 10 MG tablet TAKE 1 TABLET BY MOUTH AT BEDTIME 30 tablet 0  . montelukast (SINGULAIR) 10 MG tablet Take 1 tablet (10 mg total) by mouth at bedtime. 90 tablet 3  . Multiple Vitamin (MULTIVITAMIN WITH MINERALS) TABS tablet Take 1 tablet by mouth daily.    . mycophenolate (MYFORTIC) 180 MG EC tablet Take 360 mg by mouth 2 (two) times daily.     Marland Kitchen omeprazole (PRILOSEC) 20 MG capsule TAKE 1 CAPSULE BY MOUTH ONCE DAILY 90 capsule 3  . potassium chloride (MICRO-K) 10 MEQ CR capsule TAKE 2 CAPSULES (20 MEQ TOTAL) BY MOUTH 2 (TWO) TIMES DAILY. 360 capsule 4  . rosuvastatin (CRESTOR) 10 MG tablet TAKE 1 TABLET BY MOUTH DAILY  90 tablet 3  . sildenafil (VIAGRA) 100 MG tablet TAKE 0.5-1 TABLETS (50-100 MG TOTAL) BY MOUTH DAILY AS NEEDED FOR ERECTILE DYSFUNCTION. 10 tablet 11  . tacrolimus (PROGRAF) 0.5 MG capsule Take 1 mg by mouth 2 (two) times daily.    . tamsulosin (FLOMAX) 0.4 MG CAPS capsule Take 0.4 mg by mouth daily.     Marland Kitchen torsemide (DEMADEX) 20 MG tablet Take 3 tablets (60 mg total) by mouth daily. 270 tablet 3  . spironolactone (ALDACTONE) 25 MG tablet Take 1 tablet (25 mg total) by mouth daily. 30 tablet 11   No current facility-administered medications on file prior to visit.     Allergies  Allergen Reactions  . Morphine Itching    Past Medical History:  Diagnosis Date  .  Allergy    seasonal  . Amnestic MCI (mild cognitive impairment with memory loss)   . Asthma   . Benign neoplasm of colon   . CAD (coronary artery disease) 2005   a.  Status post four-vessel CABG in 2005  . Chronic atrial fibrillation    a. CHADS2VASc 4 (CHF, HTN, DM, vascular disease); b. Eliquis  . Chronic combined systolic and diastolic CHF (congestive heart failure) ([place])    a.  TTE 7/18: EF 45-50%, moderately dilated LA, RVSF normal, PASP normal  . Chronic venous stasis dermatitis of both lower extremities   . Cytomegaloviral disease ([place]) 2017  . Depression   . Diabetes mellitus   . Diverticulosis   . Dyspnea   . Erectile dysfunction   . ESRD (end stage renal disease) ([place]) 2005   a. HD 2004-2012; b. S/p renal transplant in 2012  . GERD (gastroesophageal reflux disease)   . Gout   . Hyperlipidemia    low since renal failure  . Hypertension   . Kidney transplant status, cadaveric 2012  . Obesity   . Pneumonia   . Purpura ([place])   . Sleep apnea    bipap with oxygen 2 l  . Trigger finger   . Ulcer 05/2016   Left shin  . Wears glasses     Past Surgical History:  Procedure Laterality Date  . BARIATRIC SURGERY    . CARDIAC CATHETERIZATION     [facility]  . CATARACT EXTRACTION W/PHACO Right 10/29/2017   Procedure: CATARACT EXTRACTION PHACO AND INTRAOCULAR LENS PLACEMENT ([place])  RIGHT DIABETIC;  Surgeon: [name], MD;  Location: [place];  Service: Ophthalmology;  Laterality: Right;  Diabetic - insulin  . CATARACT EXTRACTION W/PHACO Left 11/18/2017   Procedure: CATARACT EXTRACTION PHACO AND INTRAOCULAR LENS PLACEMENT ([place]) LEFT IVA/TOPICAL;  Surgeon: [name], MD;  Location: [place];  Service: Ophthalmology;  Laterality: Left;  DIABETES - insulin sleep apnea  . COLONOSCOPY WITH PROPOFOL N/A 04/22/2013   Procedure: COLONOSCOPY WITH PROPOFOL;  Surgeon: [name], MD;  Location: [facility];  Service: Endoscopy;  Laterality: N/A;  .  CORONARY ARTERY BYPASS GRAFT  2005   X 4  . DG ANGIO AV SHUNT*L*     right and left upper arms  . FASCIOTOMY  03/03/2012   Procedure: FASCIOTOMY;  Surgeon: [name], MD;  Location: [facility];  Service: Orthopedics;  Laterality: Right;  FASCIOTOMY RIGHT SMALL FINGER  . FASCIOTOMY Left 08/17/2013   Procedure: FASCIOTOMY LEFT RING;  Surgeon: [name], MD;  Location: [facility];  Service: Orthopedics;  Laterality: Left;  . INCISION AND DRAINAGE ABSCESS Left 10/15/2015   Procedure: INCISION AND DRAINAGE ABSCESS;  Surgeon: [name], MD;  Location: [facility];  Service: General;  Laterality: Left;  . KIDNEY TRANSPLANT  09/13/2010   cadaver--at [facility]  . RIGHT HEART CATH N/A 11/15/2016   Procedure: RIGHT HEART CATH;  Surgeon: [name], MD;  Location: [facility];  Service: Cardiovascular;  Laterality: N/A;  . TYMPANIC MEMBRANE REPAIR  1/12   left  . VASECTOMY      Family History  Problem Relation Age of Onset  . Heart disease Father   . Kidney failure Father   . Kidney disease Father   . Diabetes Maternal Grandmother   . Breast cancer Maternal Grandmother   . Valvular heart disease Mother   . Liver cancer Paternal Uncle   . Liver cancer Paternal Grandmother   . Prostate cancer Neg Hx     Social History   Socioeconomic History  . Marital status: Married    Spouse name: Not on file  . Number of children: 2  . Years of education: Not on file  . Highest education level: Not on file  Occupational History  . Occupation: Academic librarian: unemployed    Comment: disabled due to kidney failure  Social Needs  . Financial resource strain: Not on file  . Food insecurity:    Worry: Not on file    Inability: Not on file  . Transportation needs:    Medical: Not on file    Non-medical: Not on file  Tobacco Use  . Smoking status: Former Smoker    Types: Cigars    Last attempt to quit: 09/02/1994    Years since  quitting: 23.8  . Smokeless tobacco: Never Used  Substance and Sexual Activity  . Alcohol use: Yes    Alcohol/week: 0.0 - 1.0 standard drinks    Comment: occasional- 3 in the past year  . Drug use: No  . Sexual activity: Not on file  Lifestyle  . Physical activity:    Days per week: Not on file    Minutes per session: Not on file  . Stress: Not on file  Relationships  . Social connections:    Talks on phone: Not on file    Gets together: Not on file    Attends religious service: Not on file    Active member of club or organization: Not on file    Attends meetings of clubs or organizations: Not on file    Relationship status: Not on file  . Intimate partner violence:    Fear of current or ex partner: Not on file    Emotionally abused: Not on file    Physically abused: Not on file    Forced sexual activity: Not on file  Other Topics Concern  . Not on file  Social History Narrative   In school for culinary arts--- will finish 12/16      Has living will   Wife is health care POA   Would accept resuscitation attempts   Hasn't considered tube feedings   Review of Systems Sleeps 6-7 hours a night--that is better than in the past Gets up around 5am Eating okay--healthier Weight is okay Sugars okay---170 this morning but fasting usually 130-140 Did have fever and respiratory illness about a month ago---but resolved completely    Objective:   Physical Exam  Constitutional: He appears well-developed. No distress.  Psychiatric:  Not depressed Appropriate affect Normal speech and appearance           Assessment &  Plan:

## 2018-06-30 NOTE — Assessment & Plan Note (Signed)
Depression is better with the increased bupropion No need for additional medications Still frustrated by the lack of focus and being distracted and leaving tasks Discussed mind control with the [place], lists and working on eventually finishing tasks even if he gets distracted and moves to something different at first

## 2018-06-30 NOTE — Assessment & Plan Note (Signed)
Nothing to suggest exacerbation

## 2018-07-01 NOTE — Telephone Encounter (Signed)
Consent received through [place].

## 2018-07-02 ENCOUNTER — Telehealth (INDEPENDENT_AMBULATORY_CARE_PROVIDER_SITE_OTHER): Payer: 59 | Admitting: Cardiovascular Disease

## 2018-07-02 ENCOUNTER — Encounter: Payer: Self-pay | Admitting: Cardiovascular Disease

## 2018-07-02 ENCOUNTER — Other Ambulatory Visit: Payer: Self-pay

## 2018-07-02 VITALS — HR 78 | Ht 70.5 in | Wt 256.0 lb

## 2018-07-02 DIAGNOSIS — I5032 Chronic diastolic (congestive) heart failure: Secondary | ICD-10-CM

## 2018-07-02 DIAGNOSIS — I2510 Atherosclerotic heart disease of native coronary artery without angina pectoris: Secondary | ICD-10-CM | POA: Diagnosis not present

## 2018-07-02 NOTE — Patient Instructions (Signed)
Medication Instructions:  No change in medication If you need a refill on your cardiac medications before your next appointment, please call your pharmacy.   Lab work: None If you have labs (blood work) drawn today and your tests are completely normal, you will receive your results only by: Marland Kitchen MyChart Message (if you have MyChart) OR . A paper copy in the mail If you have any lab test that is abnormal or we need to change your treatment, we will call you to review the results.  Testing/Procedures: None  Follow-Up: At [facility], you and your health needs are our priority.  As part of our continuing mission to provide you with exceptional heart care, we have created designated Provider Care Teams.  These Care Teams include your primary Cardiologist (physician) and Advanced Practice Providers (APPs -  Physician Assistants and Nurse Practitioners) who all work together to provide you with the care you need, when you need it. You will need a follow up appointment in 3 months.  Please call our office 2 months in advance to schedule this appointment.  You may see [name], MD or one of the following Advanced Practice Providers on your designated Care Team:   Murray Hodgkins, NP Christell Faith, PA-C . Marrianne Mood, PA-C

## 2018-07-02 NOTE — Progress Notes (Signed)
Virtual Visit via Video Note   This visit type was conducted due to national recommendations for restrictions regarding the COVID-19 Pandemic (e.g. social distancing) in an effort to limit this patient's exposure and mitigate transmission in our community.  Due to his co-morbid illnesses, this patient is at least at moderate risk for complications without adequate follow up.  This format is felt to be most appropriate for this patient at this time.  All issues noted in this document were discussed and addressed.  A limited physical exam was performed with this format.  Please refer to the patient's chart for his consent to telehealth for [facility].   Evaluation Performed:  Follow-up visit  Date:  07/02/2018   ID:  [name], [name] 07-30-1960, MRN [id number]  Patient Location: Home  Provider Location: Office  PCP:  [name], MD  Cardiologist:  [name], MD  Electrophysiologist:  None   Chief Complaint: Follow-up visit regarding chronic systolic heart failure  History of Present Illness:    [name] is a 58 y.o. male who presents via audio/video conferencing for a telehealth visit today.   This is a follow-up visit. He has history of CAD status post CABG in 2005, chronic combined systolic and diastolic CHF secondary to ICM with prior EF of 35 to 40% now improved to 50 to 55% by echo in 03/2018, chronic A. fib on Eliquis,pulmonary hypertension, ESRD previously on hemodialysis from 2004 through 2012 status post kidney transplantin 2012, anemia of chronic disease, DM2, HTN, HLD, beta-blocker induced bradycardia, obesity status post gastric bypass surgery in 11/2013, and sleep apnea on BiPAP. 71 pounds. No changes were made.   Stress test on 04/10/2018 showed no significant ischemia, small region of fixed perfusion defect in the inferoapical and apical septal region possibly attenuation artifact, EF 47%, low risk study.    Echocardiogram in March showed normal  ejection fraction with severe pulmonary hypertension.  Estimated systolic pulmonary pressure was 74 mmHg.  The patient had issues with volume overload recently.  He was seen by [name].  He was switched to torsemide 60 mg twice daily but he reports poor response to that.  He went back on furosemide 40 mg once daily and was able to get his weight down to the mid 250 pounds range which is close to his dry weight.  He feels better overall.  His labs showed stable renal Function with a creatinine of 1.24.   The patient does not have symptoms concerning for COVID-19 infection (fever, chills, cough, or new shortness of breath).    Past Medical History:  Diagnosis Date  . Allergy    seasonal  . Amnestic MCI (mild cognitive impairment with memory loss)   . Asthma   . Benign neoplasm of colon   . CAD (coronary artery disease) 2005   a.  Status post four-vessel CABG in 2005  . Chronic atrial fibrillation    a. CHADS2VASc 4 (CHF, HTN, DM, vascular disease); b. Eliquis  . Chronic combined systolic and diastolic CHF (congestive heart failure) ([place])    a.  TTE 7/18: EF 45-50%, moderately dilated LA, RVSF normal, PASP normal  . Chronic venous stasis dermatitis of both lower extremities   . Cytomegaloviral disease ([place]) 2017  . Depression   . Diabetes mellitus   . Diverticulosis   . Dyspnea   . Erectile dysfunction   . ESRD (end stage renal disease) ([place]) 2005   a. HD 2004-2012; b. S/p renal transplant in  2012  . GERD (gastroesophageal reflux disease)   . Gout   . Hyperlipidemia    low since renal failure  . Hypertension   . Kidney transplant status, cadaveric 2012  . Obesity   . Pneumonia   . Purpura ([place])   . Sleep apnea    bipap with oxygen 2 l  . Trigger finger   . Ulcer 05/2016   Left shin  . Wears glasses    Past Surgical History:  Procedure Laterality Date  . BARIATRIC SURGERY    . CARDIAC CATHETERIZATION     [facility]  . CATARACT EXTRACTION W/PHACO Right 10/29/2017    Procedure: CATARACT EXTRACTION PHACO AND INTRAOCULAR LENS PLACEMENT ([place])  RIGHT DIABETIC;  Surgeon: [name], MD;  Location: [place];  Service: Ophthalmology;  Laterality: Right;  Diabetic - insulin  . CATARACT EXTRACTION W/PHACO Left 11/18/2017   Procedure: CATARACT EXTRACTION PHACO AND INTRAOCULAR LENS PLACEMENT ([place]) LEFT IVA/TOPICAL;  Surgeon: [name], MD;  Location: [place];  Service: Ophthalmology;  Laterality: Left;  DIABETES - insulin sleep apnea  . COLONOSCOPY WITH PROPOFOL N/A 04/22/2013   Procedure: COLONOSCOPY WITH PROPOFOL;  Surgeon: [name], MD;  [facility];  Service: Endoscopy;  Laterality: N/A;  . CORONARY ARTERY BYPASS GRAFT  2005   X 4  . DG ANGIO AV SHUNT*L*     right and left upper arms  . FASCIOTOMY  03/03/2012   Procedure: FASCIOTOMY;  Surgeon: [name], MD;  Location: [place];  Service: Orthopedics;  Laterality: Right;  FASCIOTOMY RIGHT SMALL FINGER  . FASCIOTOMY Left 08/17/2013   Procedure: FASCIOTOMY LEFT RING;  Surgeon: [name], MD;  Location: [place];  Service: Orthopedics;  Laterality: Left;  . INCISION AND DRAINAGE ABSCESS Left 10/15/2015   Procedure: INCISION AND DRAINAGE ABSCESS;  Surgeon: [name], MD;  Location: [facility];  Service: General;  Laterality: Left;  . KIDNEY TRANSPLANT  09/13/2010   cadaver--at [facility]  . RIGHT HEART CATH N/A 11/15/2016   Procedure: RIGHT HEART CATH;  Surgeon: [name], MD;  Location: [place] [facility];  Service: Cardiovascular;  Laterality: N/A;  . TYMPANIC MEMBRANE REPAIR  1/12   left  . VASECTOMY       Current Meds  Medication Sig  . albuterol (PROVENTIL HFA;VENTOLIN HFA) 108 (90 Base) MCG/ACT inhaler Inhale 2 puffs every 6 (six) hours as needed into the lungs for wheezing or shortness of breath.  . AMBULATORY NON FORMULARY MEDICATION Medication Name: Nebulizer  . amLODipine (NORVASC) 5 MG tablet Take  1 tablet (5 mg total) by mouth daily.  . beclomethasone (QVAR REDIHALER) 80 MCG/ACT inhaler Inhale 2 puffs into the lungs 2 (two) times daily. Rinse mouth after use.  Marland Kitchen buPROPion (WELLBUTRIN SR) 150 MG 12 hr tablet Take 1 tablet (150 mg total) by mouth 2 (two) times daily.  . colchicine 0.6 MG tablet Take 1 tablet (0.6 mg total) by mouth 2 (two) times daily as needed.  Marland Kitchen ELIQUIS 5 MG TABS tablet TAKE 1 TABLET (5 MG TOTAL) BY MOUTH 2 (TWO) TIMES DAILY.  Marland Kitchen insulin aspart (NOVOLOG) 100 UNIT/ML injection Inject 1-5 Units into the skin once. Using as sliding scale. Takes 1 unit if is 10 above normal, 5 units if is 50 above normal. Upper limit of normal for him is 120.  Marland Kitchen insulin glargine (LANTUS) 100 unit/mL SOPN Inject 0.1 mLs (10 Units total) into the skin at bedtime. Currently using 15 units (  Patient taking differently: Inject 10 Units into the skin at bedtime. Currently using 10 units)  . lisinopril (PRINIVIL,ZESTRIL) 40 MG tablet TAKE 1 TABLET BY MOUTH DAILY  . magnesium 30 MG tablet Take 30 mg by mouth daily.  . metolazone (ZAROXOLYN) 5 MG tablet TAKE 1 TABLET BY MOUTH AS NEEDED  . mometasone (NASONEX) 50 MCG/ACT nasal spray PLACE 2 SPRAYS INTO THE NOSE AT BEDTIME.  . montelukast (SINGULAIR) 10 MG tablet TAKE 1 TABLET BY MOUTH AT BEDTIME  . Multiple Vitamin (MULTIVITAMIN WITH MINERALS) TABS tablet Take 1 tablet by mouth daily.  . mycophenolate (MYFORTIC) 180 MG EC tablet Take 360 mg by mouth 2 (two) times daily.   Marland Kitchen omeprazole (PRILOSEC) 20 MG capsule TAKE 1 CAPSULE BY MOUTH ONCE DAILY  . potassium chloride (MICRO-K) 10 MEQ CR capsule TAKE 2 CAPSULES (20 MEQ TOTAL) BY MOUTH 2 (TWO) TIMES DAILY.  . rosuvastatin (CRESTOR) 10 MG tablet TAKE 1 TABLET BY MOUTH DAILY  . sildenafil (VIAGRA) 100 MG tablet TAKE 0.5-1 TABLETS (50-100 MG TOTAL) BY MOUTH DAILY AS NEEDED FOR ERECTILE DYSFUNCTION.  Marland Kitchen spironolactone (ALDACTONE) 25 MG tablet Take 1 tablet (25 mg total) by mouth daily.  . tacrolimus (PROGRAF) 0.5  MG capsule Take 1 mg by mouth 2 (two) times daily.  . tamsulosin (FLOMAX) 0.4 MG CAPS capsule Take 0.4 mg by mouth daily.   Marland Kitchen torsemide (DEMADEX) 20 MG tablet Take 3 tablets (60 mg total) by mouth daily.     Allergies:   Morphine   Social History   Tobacco Use  . Smoking status: Former Smoker    Types: Cigars    Last attempt to quit: 09/02/1994    Years since quitting: 23.8  . Smokeless tobacco: Never Used  Substance Use Topics  . Alcohol use: Yes    Alcohol/week: 0.0 - 1.0 standard drinks    Comment: occasional- 3 in the past year  . Drug use: No     Family Hx: The patient's family history includes Breast cancer in his maternal grandmother; Diabetes in his maternal grandmother; Heart disease in his father; Kidney disease in his father; Kidney failure in his father; Liver cancer in his paternal grandmother and paternal uncle; Valvular heart disease in his mother. There is no history of Prostate cancer.  ROS:   Please see the history of present illness.     All other systems reviewed and are negative.   Prior CV studies:   The following studies were reviewed today:  Reviewed recent echocardiogram with him.  Labs/Other Tests and Data Reviewed:    EKG:  No ECG reviewed.  Recent Labs: 08/22/2017: Magnesium 2.1 04/02/2018: Hemoglobin 11.0; Platelets 188 06/24/2018: BUN 26; Creatinine, Ser 1.24; Potassium 4.3; Sodium 139   Recent Lipid Panel Lab Results  Component Value Date/Time   CHOL 89 10/09/2017   CHOL 161 03/07/2015 08:23 AM   TRIG 73 03/06/2016 07:36 AM   HDL 34 (L) 03/06/2016 07:36 AM   HDL 38 (L) 03/07/2015 08:23 AM   CHOLHDL 2.7 03/06/2016 07:36 AM   LDLCALC 49 10/09/2017   LDLCALC 105 (H) 03/07/2015 08:23 AM   LDLDIRECT 41.9 04/20/2012 04:18 PM    Wt Readings from Last 3 Encounters:  07/02/18 256 lb (116.1 kg)  06/30/18 258 lb (117 kg)  05/04/18 266 lb (120.7 kg)     Objective:    Vital Signs:  Pulse 78   Ht 5' 10.5" (1.791 m)   Wt 256 lb (116.1 kg)    SpO2 96%  BMI 36.21 kg/m    Well nourished, well developed male in no acute distress.   ASSESSMENT & PLAN:    1. CAD involving the native coronary arteries status post CABG without angina: Currently chest pain-free.  Recent Lexiscan Myoview low risk.  Continue Eliquis in place of aspirin.  Remains on Crestor as below.  Not on beta-blocker secondary to prior beta-blocker induced bradycardia.  Aggressive risk factor modification and secondary prevention.  2.   Chronic combined CHF/pulmonary hypertension: Echocardiogram from March showed severe pulmonary hypertension likely due to significant diastolic dysfunction.  He reports improvement in volume overload and his weight is close to his dry weight.  He might be still volume overloaded nonetheless.  I think he benefits from a right heart catheterization in the near future to clarify his volume status and pulmonary pressure.  3. Chronic A. Fib: Ventricular rate is well controlled.  Not on any rate limiting medication secondary to prior bradycardic heart rates.  Continue Eliquis.  4. ESRD status post renal transplant: Followed by nephrology at [facility] with a stable renal function on recent labs.  Check BMP today.  5. Hypertension: Blood pressure has been reasonably controlled.  .  6. Hyperlipidemia: LDL of 49 from 09/2017.  Continue Crestor.    COVID-19 Education: The signs and symptoms of COVID-19 were discussed with the patient and how to seek care for testing (follow up with PCP or arrange E-visit).  The importance of social distancing was discussed today.  Time:   Today, I have spent 22 minutes with the patient with telehealth technology discussing the above problems.     Medication Adjustments/Labs and Tests Ordered: Current medicines are reviewed at length with the patient today.  Concerns regarding medicines are outlined above.  Tests Ordered: No orders of the defined types were placed  in this encounter.  Medication Changes: No orders of the defined types were placed in this encounter.   Disposition:  Follow up in 3 month(s)  Signed, [name], MD  07/02/2018 9:47 AM    [facility]

## 2018-07-03 ENCOUNTER — Ambulatory Visit (HOSPITAL_COMMUNITY)
Admission: RE | Admit: 2018-07-03 | Discharge: 2018-07-03 | Disposition: A | Discharge status: Home / Self Care | Payer: Medicare Other | Source: Ambulatory Visit | Attending: Cardiology | Admitting: Cardiology

## 2018-07-03 ENCOUNTER — Other Ambulatory Visit: Payer: Self-pay

## 2018-07-03 DIAGNOSIS — I5032 Chronic diastolic (congestive) heart failure: Secondary | ICD-10-CM

## 2018-07-03 MED ORDER — FUROSEMIDE 40 MG PO TABS: ORAL_TABLET | ORAL | 3 refills | Status: DC

## 2018-07-03 NOTE — Progress Notes (Signed)
Spoke to patient about after visit summary instructions. Pt verbalized understand and denies further needs.

## 2018-07-03 NOTE — Patient Instructions (Signed)
Lab work to be done in 2 weeks. This will be done at [facility].  INCREASE Furosemide 58m (1.5 tab) two times daily FOR 3 DAYS ONLY. Then start Furosemide 682m(1.5 tab) every morning AND 407m1 tab) every evening.  Please keep follow up telehealth visit on 08/13/18 11:20am.

## 2018-07-03 NOTE — Progress Notes (Signed)
Heart Failure TeleHealth Note  Due to national recommendations of social distancing due to [place] 19, Audio/video telehealth visit is felt to be most appropriate for this patient at this time.  See MyChart message from today for patient consent regarding telehealth for [name].  Date:  07/03/2018   ID:  [name], Devan [birth year], MRN [id number]  Location: Home  Provider location: [address] [place] Type of Visit:  Established patient  PCP:  [name], MD  Cardiologist:  [name] Primary HF: [name]  Chief Complaint: Shortness of breath   History of Present Illness: [name] is a 58 y.o. male referred by [name] for evaluation of CHF who presents via audio/video conferencing for a  telehealth visit today.     Today,  he denies symptoms of cough, fevers, chills, or new SOB worrisome for COVID 19.    Patient has history of CAD s/p CABG 2005, chronic atrial fibrillation on Eliquis, ESRD s/p renal transplant 2012, anc chronic diastolic CHF.  His renal function has been fairly stable, most recent creatinine 1.43.  He has been struggling with volume overload/CHF.  He has a history of ischemic cardiomyopathy with EF as low as 35-40% in the past but last echo in 3/20 showed EF 60-65%.  There is evidence for RV failure with pulmonary hypertension by echo.    He has been on Lasix for a long time. At initial appointment, he reported increased fluid retention and said Lasix did not seem to be working as well as in the past.  I had him stop Lasix and start torsemide 60 mg daily. However, he said that he did not have much urine output with torsemide.  Therefore, he started back on his prior Lasix dose, 40 mg bid, and says that he somewhat paradoxically diuresed well and lost about 8 lbs.  He continues to take Lasix 40 mg bid.  He has dyspnea walking up stairs or up a hill.  He was able to go fly fishing last weekend without much problem, main difficulty  being dyspnea walking up hills.  His ankles still get quite swollen by the end of the day.    Labs (7/19): LDL 49 Labs (2/20): K 3.7, creatinine 1.43 Labs (4/20): K 4.3, creatinine 1.24  PMH: 1. CAD: s/p CABG x 4 in [place] in 2005.  - Cardiolite (1/20): EF 47%, small fixed apical septal defect, no ischemia.  Low risk study.  2. Asthma 3. Gout 4. Atrial fibrillation: Chronic.  5. ESRD s/p renal transplantation at [facility] in 2012.  6. Anemia of chronic disease 7. Type II diabetes 8. HTN 9. Hyperlipidemia 10. OSA: On bipap.  11. Ischemic cardiomyopathy: EF 35-40% in past.  - Cardiolite (1/20) with EF 47%.  - Echo (1/20): EF 50-55%, mild MR, RV moderately dilated with moderately decreased systolic function, PASP 73 mmHg, moderate TR.   - Echo (3/20): EF 60-65%, PASP 75 mmHg, mildly dilated RV with mildly decreased systolic function.  12. Obesity: s/p gastric bypass in 2015.  13. Bradycardia with beta blocker.   Past Surgical History:  Procedure Laterality Date   BARIATRIC SURGERY     CARDIAC CATHETERIZATION     [facility]   CATARACT EXTRACTION W/PHACO Right 10/29/2017   Procedure: CATARACT EXTRACTION PHACO AND INTRAOCULAR LENS PLACEMENT ([place])  RIGHT DIABETIC;  Surgeon: [name], MD;  Location: [place];  Service: Ophthalmology;  Laterality: Right;  Diabetic - insulin   CATARACT  EXTRACTION W/PHACO Left 11/18/2017   Procedure: CATARACT EXTRACTION PHACO AND INTRAOCULAR LENS PLACEMENT ([facility]) LEFT IVA/TOPICAL;  Surgeon: [name], MD;  Location: [place];  Service: Ophthalmology;  Laterality: Left;  DIABETES - insulin sleep apnea   COLONOSCOPY WITH PROPOFOL N/A 04/22/2013   Procedure: COLONOSCOPY WITH PROPOFOL;  Surgeon: [name], MD;  [facility];  Service: Endoscopy;  Laterality: N/A;   CORONARY ARTERY BYPASS GRAFT  2005   X 4   DG ANGIO AV SHUNT*L*     right and left upper arms   FASCIOTOMY  03/03/2012   Procedure:  FASCIOTOMY;  Surgeon: [name], MD;  Location: [place];  Service: Orthopedics;  Laterality: Right;  FASCIOTOMY RIGHT SMALL FINGER   FASCIOTOMY Left 08/17/2013   Procedure: FASCIOTOMY LEFT RING;  Surgeon: [name], MD;  Location: [place];  Service: Orthopedics;  Laterality: Left;   INCISION AND DRAINAGE ABSCESS Left 10/15/2015   Procedure: INCISION AND DRAINAGE ABSCESS;  Surgeon: [name], MD;  Location: [facility];  Service: General;  Laterality: Left;   KIDNEY TRANSPLANT  09/13/2010   cadaver--at [facility] HEART CATH N/A 11/15/2016   Procedure: RIGHT HEART CATH;  Surgeon: [name], MD;  Location: [place] [facility];  Service: Cardiovascular;  Laterality: N/A;   TYMPANIC MEMBRANE REPAIR  1/12   left   VASECTOMY       Current Outpatient Medications  Medication Sig Dispense Refill   albuterol (PROVENTIL HFA;VENTOLIN HFA) 108 (90 Base) MCG/ACT inhaler Inhale 2 puffs every 6 (six) hours as needed into the lungs for wheezing or shortness of breath. 1 Inhaler 2   AMBULATORY NON FORMULARY MEDICATION Medication Name: Nebulizer 1 each 0   amLODipine (NORVASC) 5 MG tablet Take 1 tablet (5 mg total) by mouth daily. 90 tablet 3   beclomethasone (QVAR REDIHALER) 80 MCG/ACT inhaler Inhale 2 puffs into the lungs 2 (two) times daily. Rinse mouth after use. 1 Inhaler 2   buPROPion (WELLBUTRIN SR) 150 MG 12 hr tablet Take 1 tablet (150 mg total) by mouth 2 (two) times daily. 60 tablet 3   colchicine 0.6 MG tablet Take 1 tablet (0.6 mg total) by mouth 2 (two) times daily as needed. 60 tablet 1   ELIQUIS 5 MG TABS tablet TAKE 1 TABLET (5 MG TOTAL) BY MOUTH 2 (TWO) TIMES DAILY. 180 tablet 1   furosemide (LASIX) 40 MG tablet Take 1.5 tablets (60 mg total) by mouth every morning AND 1 tablet (40 mg total) every evening. 225 tablet 3   insulin aspart (NOVOLOG) 100 UNIT/ML injection Inject 1-5 Units into the skin once. Using as sliding scale.  Takes 1 unit if is 10 above normal, 5 units if is 50 above normal. Upper limit of normal for him is 120.     insulin glargine (LANTUS) 100 unit/mL SOPN Inject 0.1 mLs (10 Units total) into the skin at bedtime. Currently using 15 units (Patient taking differently: Inject 10 Units into the skin at bedtime. Currently using 10 units) 15 mL 11   lisinopril (PRINIVIL,ZESTRIL) 40 MG tablet TAKE 1 TABLET BY MOUTH DAILY 30 tablet 2   magnesium 30 MG tablet Take 30 mg by mouth daily.     metolazone (ZAROXOLYN) 5 MG tablet TAKE 1 TABLET BY MOUTH AS NEEDED 30 tablet 3   mometasone (NASONEX) 50 MCG/ACT nasal spray PLACE 2 SPRAYS INTO THE NOSE AT BEDTIME. 17 g 11   montelukast (SINGULAIR) 10 MG  tablet TAKE 1 TABLET BY MOUTH AT BEDTIME 30 tablet 0   Multiple Vitamin (MULTIVITAMIN WITH MINERALS) TABS tablet Take 1 tablet by mouth daily.     mycophenolate (MYFORTIC) 180 MG EC tablet Take 360 mg by mouth 2 (two) times daily.      omeprazole (PRILOSEC) 20 MG capsule TAKE 1 CAPSULE BY MOUTH ONCE DAILY 90 capsule 3   potassium chloride (MICRO-K) 10 MEQ CR capsule TAKE 2 CAPSULES (20 MEQ TOTAL) BY MOUTH 2 (TWO) TIMES DAILY. 360 capsule 4   rosuvastatin (CRESTOR) 10 MG tablet TAKE 1 TABLET BY MOUTH DAILY 90 tablet 3   sildenafil (VIAGRA) 100 MG tablet TAKE 0.5-1 TABLETS (50-100 MG TOTAL) BY MOUTH DAILY AS NEEDED FOR ERECTILE DYSFUNCTION. 10 tablet 11   spironolactone (ALDACTONE) 25 MG tablet Take 1 tablet (25 mg total) by mouth daily. 30 tablet 11   tacrolimus (PROGRAF) 0.5 MG capsule Take 1 mg by mouth 2 (two) times daily.     tamsulosin (FLOMAX) 0.4 MG CAPS capsule Take 0.4 mg by mouth daily.      torsemide (DEMADEX) 20 MG tablet Take 3 tablets (60 mg total) by mouth daily. (Patient not taking: Reported on 07/02/2018) 270 tablet 3   No current facility-administered medications for this encounter.     Allergies:   Morphine   Social History:  The patient  reports that he quit smoking about 23 years  ago. His smoking use included cigars. He has never used smokeless tobacco. He reports current alcohol use. He reports that he does not use drugs.   Family History:  The patient's family history includes Breast cancer in his maternal grandmother; Diabetes in his maternal grandmother; Heart disease in his father; Kidney disease in his father; Kidney failure in his father; Liver cancer in his paternal grandmother and paternal uncle; Valvular heart disease in his mother.   ROS:  Please see the history of present illness.   All other systems are personally reviewed and negative.   Exam:  (Video/Tele Health Call; Exam is subjective and or/visual.) General:  Well appearing. No resp difficulty. HEENT: Normal Neck: JVP 8-9 cm observing on camera Lungs: Normal respiratory effort with conversation.  Abdomen: Non-distended. Pt denies tenderness with self palpation.  Extremities: 1+ ankle edema.  Neuro: Alert & oriented x 3.   Recent Labs: 08/22/2017: Magnesium 2.1 04/02/2018: Hemoglobin 11.0; Platelets 188 06/24/2018: BUN 26; Creatinine, Ser 1.24; Potassium 4.3; Sodium 139  Personally reviewed   Wt Readings from Last 3 Encounters:  07/02/18 116.1 kg (256 lb)  06/30/18 117 kg (258 lb)  05/04/18 120.7 kg (266 lb)    ASSESSMENT AND PLAN:  1. Chronic diastolic CHF: With prominent RV failure/pulmonary hypertension. ?If RV failure is related to severe OSA, he was a remote smoker.  Echo in 3/20 with EF 60-65%, mildly dilated RV with mildly decreased systolic function, severe pulmonary hypertension.  NYHA class II symptoms currently but somewhat worse than in the past.  Will need to be careful with diuresis given history of renal transplantation. I tried him on torsemide after last appointment, but paradoxically he feels like Lasix worked better and he is back on his prior Lasix dose 40 mg bid.  On exam, he still looks mildly volume overloaded but better than at last visit.  - Increase Lasix to 60 mg bid x 3  days, then 60 qam/40 qpm.  BMET in 2 wks.  - He will need eventual RHC.  We discussed this again today, will plan to do it  when COVID restrictions relaxed.   - Continue current lisinopril and spironolactone.  2. Atrial fibrillation: Chronic.  He will continue Eliquis. Not on beta blocker with history of bradycardia.  Rate is controlled.  3. CAD: S/p CABG 2005.  Low risk Cardiolite in 1/20.   - No ASA given Eliquis use.  - Continue Crestor 10 mg daily, good lipids in 7/19.  4. CKD: Stage 3.  S/p renal transplantation in 2012. He remains on mycophenolate and tacrolimus.  - Follow creatinine closely with diuresis, BMET 2 wks.  5. OSA: On Bipap.  6. HTN: BP controlled.  7. Pulmonary hypertension: Severe pulmonary hypertension by 3/20 echo.  Given his gender, most likely this is a combination of group 2 (pulmonary venous hypertension) and group 3 (OSA, ?unrecognized OHS).    COVID screen The patient does not have any symptoms that suggest any further testing/ screening at this time.  Social distancing reinforced today.  Recommended follow-up:  1 month via telehealth.   Relevant cardiac medications were reviewed at length with the patient today.   The patient does not have concerns regarding their medications at this time.   Patient Risk: After full review of this patients clinical status, I feel that they are at moderate risk for cardiac decompensation at this time.  Today, I have spent 21 minutes with the patient with telehealth technology discussing CHF .    Signed, Loralie Champagne, MD  07/03/2018  [facility] [address] [facility] [place] [address] [phone] (office) [phone] (fax)

## 2018-07-15 ENCOUNTER — Ambulatory Visit: Payer: 59 | Admitting: Family

## 2018-07-16 ENCOUNTER — Other Ambulatory Visit: Payer: Self-pay | Admitting: *Deleted

## 2018-07-16 MED FILL — OMEPRAZOLE 20 MG CPDR: 20 | 30 days supply | Qty: 30 | Fill #0

## 2018-07-16 MED FILL — TACROLIMUS ANHYDROUS 0.5MG: 0.5 | 30 days supply | Qty: 180 | Fill #0

## 2018-07-16 NOTE — Telephone Encounter (Signed)
This needs to be done by his transplant team

## 2018-07-16 NOTE — Telephone Encounter (Signed)
Last office visit 06/30/2018 fir MDD.  Tacrolimus is listed as historical medication.  Ok to refill?

## 2018-07-26 DIAGNOSIS — G4733 Obstructive sleep apnea (adult) (pediatric): Secondary | ICD-10-CM | POA: Diagnosis not present

## 2018-07-26 DIAGNOSIS — E662 Morbid (severe) obesity with alveolar hypoventilation: Secondary | ICD-10-CM | POA: Diagnosis not present

## 2018-08-03 MED FILL — SILDENAFIL CITRATE 100 MG T: 100 | 30 days supply | Qty: 6 | Fill #0

## 2018-08-04 MED FILL — BUPROPION HCL SR 150 MG TAB: 150 | 30 days supply | Qty: 60 | Fill #0

## 2018-08-04 MED FILL — OMEPRAZOLE 20 MG CPDR: 20 | 30 days supply | Qty: 30 | Fill #1

## 2018-08-06 ENCOUNTER — Telehealth: Payer: Self-pay | Admitting: Cardiovascular Disease

## 2018-08-06 ENCOUNTER — Telehealth: Payer: Self-pay | Admitting: Family

## 2018-08-06 NOTE — Telephone Encounter (Signed)
STAT if patient feels like he/she is going to faint   1) Are you dizzy now? yes  2) Do you feel faint or have you passed out? Feel faint has not passed out   3) Do you have any other symptoms? Jittery o2 sat 88-94   4) Have you checked your HR and BP (record if available)?  HR 50's-60's  BP cuff not working

## 2018-08-06 NOTE — Telephone Encounter (Signed)
Patient called to say that he doesn't feel good and that his oxygen levels have been running low. He says that earlier today, his oxygen level ranged from 78-85% and is now up to 93%. Does not wear oxygen. Says that he feels a little dizzy and a little short of breath as well. Denies any chest pain, swelling or weight gain. Says that his weight today was 255 pounds. No recent medication changes and he says that he's been eating/ drinking like he normally does. Says that symptoms just started recently. Denies any fever or flu-like symptoms.   Discussed that if his oxygen levels are truly that low, that he may need to go to the ED which he doesn't really want to do. Advised him to change the batteries in his pulse oximeter to make sure that he's getting accurate readings but also to call Dr. Tyrell Antonio office to report symptoms. Patient verbalized understanding and was agreeable to this.

## 2018-08-06 NOTE — Telephone Encounter (Signed)
Spoke with the pt. Pt sts that today after lunch he experienced of dizziness and feeling as though he was going to faint. Pt denies syncope, palpitations, sob, chest pain, diaphoresis. His BP cuff does not work and he is unable to ck his BP. During the episode he placed his pulse ox on his finger the reading HR:44bpm O2:79. I asked the pt to recheck while I was on the line, pt reports 60bpm, 94O2 and 78bpm, 92O2. Pt is a diabetic and had not checked his BS. I asked him to while I held the line. Pt reports his BS 290, pt then self adm 10 units of novolog.  Pt does has a hx of chronic AFIB, he is not able to tell when he is out of rhythm. Adv the pt that his pulse ox reading of a 79 O2 may not have been accurate due to decreased perfusion if he is out of rhythm and him denying sob during the episode. Pt is at home today. Adv him to take it easy, if reoccurrence of dizziness with ambulation he should sit and relax until it subsides. Adv the pt that I will fwd an update to [facility] and call back with his recommendation. Pt agreeable with the plan and verbalized understanding.

## 2018-08-07 NOTE — Telephone Encounter (Addendum)
Spoke with the pt and made him aware of Dr.Arida's recommendation. Pt sts that he did have a reoccurrence of lightheadedness and dizziness this morning that was short in duration. Pt sts that he is currently asymptomatic. He is reluctant to go to the ED for evaluation. Per Dr.Arida in office appt scheduled with him for 08/11/18 @ 2pm. Pt made aware of appt date and time. Pt also made aware of entry and screening for [facility] and to give sufficient time to arrive  For the appt. Pre-visit COVID-19 questionnaire completed.  Adv pt that if symptoms return or worsen his is to report to the ED for evaluation. Pt verbalized understanding and voiced appreciation for the call.        COVID-19 Pre-Screening Questions:  . In the past 7 to 10 days have you had a cough,  shortness of breath, headache, congestion, fever, body aches, chills, sore throat, or sudden loss of taste or sense of smell? No . Have you been around anyone with known Covid 19? No . Have you been around anyone who is awaiting Covid 19 test results in the past 7 to 10 days? No . Have you been around anyone who has been exposed to Covid 19, or has mentioned symptoms of Covid 19 within the past 7 to 10 days? No

## 2018-08-07 NOTE — Telephone Encounter (Signed)
I do not think we can handle this over the phone.  If he is having recurrent symptoms, he might need to go to the ED for evaluation.  Otherwise if he is feeling better he should be seen in clinic next week.

## 2018-08-11 ENCOUNTER — Encounter: Payer: Self-pay | Admitting: Cardiovascular Disease

## 2018-08-11 ENCOUNTER — Other Ambulatory Visit: Payer: Self-pay

## 2018-08-11 ENCOUNTER — Ambulatory Visit (INDEPENDENT_AMBULATORY_CARE_PROVIDER_SITE_OTHER): Payer: 59 | Admitting: Cardiovascular Disease

## 2018-08-11 VITALS — BP 140/74 | HR 61 | Ht 70.5 in | Wt 261.0 lb

## 2018-08-11 DIAGNOSIS — I482 Chronic atrial fibrillation, unspecified: Secondary | ICD-10-CM | POA: Diagnosis not present

## 2018-08-11 DIAGNOSIS — I2510 Atherosclerotic heart disease of native coronary artery without angina pectoris: Secondary | ICD-10-CM

## 2018-08-11 DIAGNOSIS — R55 Syncope and collapse: Secondary | ICD-10-CM | POA: Diagnosis not present

## 2018-08-11 DIAGNOSIS — I5022 Chronic systolic (congestive) heart failure: Secondary | ICD-10-CM

## 2018-08-11 NOTE — Patient Instructions (Addendum)
Medication Instructions:  Your physician recommends that you continue on your current medications as directed. Please refer to the Current Medication list given to you today.  If you need a refill on your cardiac medications before your next appointment, please call your pharmacy.   Lab work: None ordered If you have labs (blood work) drawn today and your tests are completely normal, you will receive your results only by: Marland Kitchen MyChart Message (if you have MyChart) OR . A paper copy in the mail If you have any lab test that is abnormal or we need to change your treatment, we will call you to review the results.  Testing/Procedures: Your physician has recommended that you wear an zio-monitor live telemtry. Ziot monitors are medical devices that record the heart's electrical activity. Doctors most often [name] these monitors to diagnose arrhythmias. Arrhythmias are problems with the speed or rhythm of the heartbeat. The monitor is a small, portable device. You can wear one while you do your normal daily activities. This is usually used to diagnose what is causing palpitations/syncope (passing out).     Follow-Up: At [facility], you and your health needs are our priority.  As part of our continuing mission to provide you with exceptional heart care, we have created designated Provider Care Teams.  These Care Teams include your primary Cardiologist (physician) and Advanced Practice Providers (APPs -  Physician Assistants and Nurse Practitioners) who all work together to provide you with the care you need, when you need it. You will need a follow up appointment in 3 months.  Please call our office 2 months in advance to schedule this appointment.  You may see [name], MD or one of the following Advanced Practice Providers on your designated Care Team:   Murray Hodgkins, NP Christell Faith, PA-C . Marrianne Mood, PA-C

## 2018-08-11 NOTE — Progress Notes (Signed)
Cardiology Office Note   Date:  08/11/2018   ID:  Karriem, Muench 1960-10-10, MRN [id number]  PCP:  [name], MD  Cardiologist:   [name], MD   Chief Complaint  Patient presents with  . Other    pt having dizzy spells and reports low O2. Meds reviewed verbally with patient.       History of Present Illness: [name] is a 58 y.o. male  who presents for a follow-up visit and urgent evaluation regarding bradycardia and presyncope.   He hashistory of CAD status post CABG in 2005, chronic combined systolic and diastolic CHF secondary to ICM with prior EF of 35 to 40%now improved to 50 to 55% by echo in 03/2018, chronic A. fib on Eliquis,pulmonary hypertension, ESRD previously on hemodialysis from 2004 through 2012 status post kidney transplantin 2012, anemia of chronic disease, DM2, HTN, HLD, beta-blocker induced bradycardia, obesity status post gastric bypass surgery in 11/2013, and sleep apneaon BiPAP.  Stress test on 04/10/2018 showed no significant ischemia, small region of fixed perfusion defect in the inferoapical and apical septal region possibly attenuation artifact, EF 47%, low risk study.   Echocardiogram in March showed normal ejection fraction with severe pulmonary hypertension.  Estimated systolic pulmonary pressure was 74 mmHg.  He called [name] last week with intermittent episodes of extreme dizziness.  He checked his heart rate and it was in the high 40s.  No syncope.  No chest pain or worsening dyspnea.    Past Medical History:  Diagnosis Date  . Allergy    seasonal  . Amnestic MCI (mild cognitive impairment with memory loss)   . Asthma   . Benign neoplasm of colon   . CAD (coronary artery disease) 2005   a.  Status post four-vessel CABG in 2005  . Chronic atrial fibrillation    a. CHADS2VASc 4 (CHF, HTN, DM, vascular disease); b. Eliquis  . Chronic combined systolic and diastolic CHF (congestive heart failure) ([place])    a.  TTE 7/18: EF  45-50%, moderately dilated LA, RVSF normal, PASP normal  . Chronic venous stasis dermatitis of both lower extremities   . Cytomegaloviral disease ([place]) 2017  . Depression   . Diabetes mellitus   . Diverticulosis   . Dyspnea   . Erectile dysfunction   . ESRD (end stage renal disease) ([place]) 2005   a. HD 2004-2012; b. S/p renal transplant in 2012  . GERD (gastroesophageal reflux disease)   . Gout   . Hyperlipidemia    low since renal failure  . Hypertension   . Kidney transplant status, cadaveric 2012  . Obesity   . Pneumonia   . Purpura ([place])   . Sleep apnea    bipap with oxygen 2 l  . Trigger finger   . Ulcer 05/2016   Left shin  . Wears glasses     Past Surgical History:  Procedure Laterality Date  . BARIATRIC SURGERY    . CARDIAC CATHETERIZATION     [facility]  . CATARACT EXTRACTION W/PHACO Right 10/29/2017   Procedure: CATARACT EXTRACTION PHACO AND INTRAOCULAR LENS PLACEMENT ([place])  RIGHT DIABETIC;  Surgeon: [name], MD;  Location: [place];  Service: Ophthalmology;  Laterality: Right;  Diabetic - insulin  . CATARACT EXTRACTION W/PHACO Left 11/18/2017   Procedure: CATARACT EXTRACTION PHACO AND INTRAOCULAR LENS PLACEMENT ([place]) LEFT IVA/TOPICAL;  Surgeon: [name], MD;  Location: [place];  Service: Ophthalmology;  Laterality: Left;  DIABETES - insulin sleep  apnea  . COLONOSCOPY WITH PROPOFOL N/A 04/22/2013   Procedure: COLONOSCOPY WITH PROPOFOL;  Surgeon: [name], MD;  Location: [facility];  Service: Endoscopy;  Laterality: N/A;  . CORONARY ARTERY BYPASS GRAFT  2005   X 4  . DG ANGIO AV SHUNT*L*     right and left upper arms  . FASCIOTOMY  03/03/2012   Procedure: FASCIOTOMY;  Surgeon: [name], MD;  Location: [place];  Service: Orthopedics;  Laterality: Right;  FASCIOTOMY RIGHT SMALL FINGER  . FASCIOTOMY Left 08/17/2013   Procedure: FASCIOTOMY LEFT RING;  Surgeon: [name], MD;  Location: [place];  Service: Orthopedics;  Laterality: Left;  . INCISION AND DRAINAGE ABSCESS Left 10/15/2015   Procedure: INCISION AND DRAINAGE ABSCESS;  Surgeon: [name], MD;  Location: [facility];  Service: General;  Laterality: Left;  . KIDNEY TRANSPLANT  09/13/2010   cadaver--at [facility]  . RIGHT HEART CATH N/A 11/15/2016   Procedure: RIGHT HEART CATH;  Surgeon: [name], MD;  Location: [place] [facility];  Service: Cardiovascular;  Laterality: N/A;  . TYMPANIC MEMBRANE REPAIR  1/12   left  . VASECTOMY       Current Outpatient Medications  Medication Sig Dispense Refill  . albuterol (PROVENTIL HFA;VENTOLIN HFA) 108 (90 Base) MCG/ACT inhaler Inhale 2 puffs every 6 (six) hours as needed into the lungs for wheezing or shortness of breath. 1 Inhaler 2  . AMBULATORY NON FORMULARY MEDICATION Medication Name: Nebulizer 1 each 0  . amLODipine (NORVASC) 5 MG tablet Take 1 tablet (5 mg total) by mouth daily. 90 tablet 3  . beclomethasone (QVAR REDIHALER) 80 MCG/ACT inhaler Inhale 2 puffs into the lungs 2 (two) times daily. Rinse mouth after use. 1 Inhaler 2  . buPROPion (WELLBUTRIN SR) 150 MG 12 hr tablet Take 1 tablet (150 mg total) by mouth 2 (two) times daily. 60 tablet 3  . colchicine 0.6 MG tablet Take 1 tablet (0.6 mg total) by mouth 2 (two) times daily as needed. 60 tablet 1  . ELIQUIS 5 MG TABS tablet TAKE 1 TABLET (5 MG TOTAL) BY MOUTH 2 (TWO) TIMES DAILY. 180 tablet 1  . furosemide (LASIX) 40 MG tablet Take 1.5 tablets (60 mg total) by mouth every morning AND 1 tablet (40 mg total) every evening. 225 tablet 3  . insulin aspart (NOVOLOG) 100 UNIT/ML injection Inject 1-5 Units into the skin once. Using as sliding scale. Takes 1 unit if is 10 above normal, 5 units if is 50 above normal. Upper limit of normal for him is 120.    Marland Kitchen insulin glargine (LANTUS) 100 unit/mL SOPN Inject 0.1 mLs (10 Units total) into the skin at bedtime. Currently using 15 units (Patient taking differently:  Inject 10 Units into the skin at bedtime. Currently using 10 units) 15 mL 11  . lisinopril (PRINIVIL,ZESTRIL) 40 MG tablet TAKE 1 TABLET BY MOUTH DAILY 30 tablet 2  . magnesium 30 MG tablet Take 30 mg by mouth daily.    . metolazone (ZAROXOLYN) 5 MG tablet TAKE 1 TABLET BY MOUTH AS NEEDED 30 tablet 3  . mometasone (NASONEX) 50 MCG/ACT nasal spray PLACE 2 SPRAYS INTO THE NOSE AT BEDTIME. 17 g 11  . montelukast (SINGULAIR) 10 MG tablet TAKE 1 TABLET BY MOUTH AT BEDTIME 30 tablet 0  . Multiple Vitamin (MULTIVITAMIN WITH MINERALS) TABS tablet Take 1 tablet by mouth daily.    . mycophenolate (MYFORTIC) 180 MG EC tablet Take 360  mg by mouth 2 (two) times daily.     Marland Kitchen omeprazole (PRILOSEC) 20 MG capsule TAKE 1 CAPSULE BY MOUTH ONCE DAILY 90 capsule 3  . potassium chloride (MICRO-K) 10 MEQ CR capsule TAKE 2 CAPSULES (20 MEQ TOTAL) BY MOUTH 2 (TWO) TIMES DAILY. 360 capsule 4  . rosuvastatin (CRESTOR) 10 MG tablet TAKE 1 TABLET BY MOUTH DAILY 90 tablet 3  . sildenafil (VIAGRA) 100 MG tablet TAKE 0.5-1 TABLETS (50-100 MG TOTAL) BY MOUTH DAILY AS NEEDED FOR ERECTILE DYSFUNCTION. 10 tablet 11  . spironolactone (ALDACTONE) 25 MG tablet Take 1 tablet (25 mg total) by mouth daily. 30 tablet 11  . tacrolimus (PROGRAF) 0.5 MG capsule Take 1 mg by mouth 2 (two) times daily.    . tamsulosin (FLOMAX) 0.4 MG CAPS capsule Take 0.4 mg by mouth daily.     Marland Kitchen torsemide (DEMADEX) 20 MG tablet Take 3 tablets (60 mg total) by mouth daily. (Patient not taking: Reported on 07/02/2018) 270 tablet 3   No current facility-administered medications for this visit.     Allergies:   Morphine    Social History:  The patient  reports that he quit smoking about 23 years ago. His smoking use included cigars. He has never used smokeless tobacco. He reports current alcohol use. He reports that he does not use drugs.   Family History:  The patient's family history includes Breast cancer in his maternal grandmother; Diabetes in his  maternal grandmother; Heart disease in his father; Kidney disease in his father; Kidney failure in his father; Liver cancer in his paternal grandmother and paternal uncle; Valvular heart disease in his mother.    ROS:  Please see the history of present illness.   Otherwise, review of systems are positive for none.   All other systems are reviewed and negative.    PHYSICAL EXAM: VS:  BP 140/74 (BP Location: Left Arm, Patient Position: Sitting, Cuff Size: Normal)   Pulse 61   Ht 5' 10.5" (1.791 m)   Wt 261 lb (118.4 kg)   SpO2 96%   BMI 36.92 kg/m  , BMI Body mass index is 36.92 kg/m. GEN: Well nourished, well developed, in no acute distress  HEENT: normal  Neck: No JVD,  no carotid bruits, or masses Cardiac: Irregularly irregular; no  rubs, or gallops, trace leg  edema . 2/6 systolic ejection murmur in the aortic area. Respiratory:  clear to auscultation bilaterally, normal work of breathing GI: soft, nontender, , + BS.  Nondistended. MS: no deformity or atrophy  Skin: warm and dry, no rash Neuro:  Strength and sensation are intact Psych: euthymic mood, full affect   EKG:  EKG is ordered today. The ekg ordered today demonstrates atrial fibrillation with nonspecific IVCD.  Ventricular rate is 61 bpm.   Recent Labs: 08/22/2017: Magnesium 2.1 04/02/2018: Hemoglobin 11.0; Platelets 188 06/24/2018: BUN 26; Creatinine, Ser 1.24; Potassium 4.3; Sodium 139    Lipid Panel    Component Value Date/Time   CHOL 89 10/09/2017   CHOL 161 03/07/2015 0823   TRIG 73 03/06/2016 0736   HDL 34 (L) 03/06/2016 0736   HDL 38 (L) 03/07/2015 0823   CHOLHDL 2.7 03/06/2016 0736   VLDL 15 03/06/2016 0736   LDLCALC 49 10/09/2017   LDLCALC 105 (H) 03/07/2015 0823   LDLDIRECT 41.9 04/20/2012 1618      Wt Readings from Last 3 Encounters:  08/11/18 261 lb (118.4 kg)  07/02/18 256 lb (116.1 kg)  06/30/18 258 lb (117 kg)  No flowsheet data found.    ASSESSMENT AND PLAN:  1.  Presyncope  and intermittent bradycardia: The patient had bradycardia before on beta-blockers but he is currently not on any medication that can cause bradycardia.  He does have IVCD with left bundle branch block morphology and thus he is at high risk for significant bradycardic events.  He is not bradycardic today.  I requested a 2-week ZIO patch monitor with low threshold to refer to EP if needed.  2.  Chronic systolic heart failure:   The patient appears to be euvolemic and has been stable overall.  He is not on a beta blocker due to previous bradycardia on carvedilol. Continue lisinopril 40 mg daily.  3. Chronic atrial fibrillation: Rate is controlled without medications. He is tolerating anticoagulation with Eliquis.  He has been in atrial fibrillation since 2016.  4. Coronary artery disease involving native coronary arteries without angina: Continue medical therapy.   5. Hyperlipidemia: Continue treatment with rosuvastatin 10 mg daily. Most recent LDL was 43.   6.  Status post kidney transplant: Most recent creatinine was 1.24   Disposition: Follow-up in 3 months  Signed,  [name], MD  08/11/2018 2:21 PM    [facility]

## 2018-08-13 ENCOUNTER — Other Ambulatory Visit: Payer: Self-pay

## 2018-08-13 ENCOUNTER — Telehealth (HOSPITAL_COMMUNITY): Payer: 59 | Admitting: Cardiology

## 2018-08-13 ENCOUNTER — Ambulatory Visit (HOSPITAL_COMMUNITY)
Admission: RE | Admit: 2018-08-13 | Discharge: 2018-08-13 | Disposition: A | Discharge status: Home / Self Care | Payer: Medicare Other | Source: Ambulatory Visit | Attending: Cardiology | Admitting: Cardiology

## 2018-08-19 ENCOUNTER — Other Ambulatory Visit: Payer: Self-pay | Admitting: Family

## 2018-08-19 ENCOUNTER — Telehealth: Payer: Self-pay | Admitting: Cardiovascular Disease

## 2018-08-19 MED FILL — TACROLIMUS ANHYDROUS 0.5MG: 0.5 | 30 days supply | Qty: 180 | Fill #1

## 2018-08-19 MED FILL — LISINOPRIL 40 MG TABLET: 40 | 90 days supply | Qty: 90 | Fill #0

## 2018-08-19 MED FILL — POTASSIUM CL ER 10 MEQ CAP: 10 | 90 days supply | Qty: 360 | Fill #0

## 2018-08-19 MED FILL — LANTUS SOLOSTAR 100 UNITS/M: 100 | 80 days supply | Qty: 12 | Fill #0

## 2018-08-19 NOTE — Telephone Encounter (Signed)
[employer] states it has been over 72 hours and monitor has not been activated.

## 2018-08-19 NOTE — Telephone Encounter (Signed)
Attempted to call the patient. No answer- I left a message for him to please call back.

## 2018-08-20 ENCOUNTER — Telehealth: Payer: Self-pay | Admitting: Cardiovascular Disease

## 2018-08-20 ENCOUNTER — Ambulatory Visit (INDEPENDENT_AMBULATORY_CARE_PROVIDER_SITE_OTHER): Payer: 59

## 2018-08-20 DIAGNOSIS — R55 Syncope and collapse: Secondary | ICD-10-CM

## 2018-08-20 DIAGNOSIS — I482 Chronic atrial fibrillation, unspecified: Secondary | ICD-10-CM | POA: Diagnosis not present

## 2018-08-20 NOTE — Telephone Encounter (Signed)
I spoke with the patient. He states he just got back from [place] last night after ~ 1.5 weeks.   He states he does have a box that he has not opened yet. I have asked to please see if this is his monitor and if so, could he please place this today.  He states he will do so.   I have contacted [employer] as well to notify them as to why the monitor had not yet been placed.

## 2018-08-20 NOTE — Telephone Encounter (Addendum)
Call received from [facility] with [employer] rep [employer]. Baseline transmission received this morning @ 9:47am. ECG demonstrates Atrial fibrillation with a slow rate of 46-62 bpm. Ran[facility] will upload strips for review. Pt contacted by [employer] and reports being asymptomatic. No action needed at this time.

## 2018-08-20 NOTE — Telephone Encounter (Signed)
[employer] calling with an abnormal EKG report Transferred to [employer]

## 2018-08-20 NOTE — Telephone Encounter (Signed)
See 08/19/18 telephone encounter for documentation.

## 2018-08-24 ENCOUNTER — Other Ambulatory Visit: Payer: Self-pay

## 2018-08-24 ENCOUNTER — Encounter: Payer: Self-pay | Admitting: Family

## 2018-08-24 ENCOUNTER — Ambulatory Visit: Payer: 59 | Attending: Family | Admitting: Family

## 2018-08-24 VITALS — BP 150/73 | HR 53 | Resp 18 | Ht 70.0 in | Wt 266.4 lb

## 2018-08-24 DIAGNOSIS — R140 Abdominal distension (gaseous): Secondary | ICD-10-CM | POA: Insufficient documentation

## 2018-08-24 DIAGNOSIS — R0602 Shortness of breath: Secondary | ICD-10-CM | POA: Diagnosis not present

## 2018-08-24 DIAGNOSIS — I132 Hypertensive heart and chronic kidney disease with heart failure and with stage 5 chronic kidney disease, or end stage renal disease: Secondary | ICD-10-CM | POA: Insufficient documentation

## 2018-08-24 DIAGNOSIS — Z940 Kidney transplant status: Secondary | ICD-10-CM | POA: Insufficient documentation

## 2018-08-24 DIAGNOSIS — I482 Chronic atrial fibrillation, unspecified: Secondary | ICD-10-CM | POA: Diagnosis not present

## 2018-08-24 DIAGNOSIS — Z79899 Other long term (current) drug therapy: Secondary | ICD-10-CM | POA: Insufficient documentation

## 2018-08-24 DIAGNOSIS — E876 Hypokalemia: Secondary | ICD-10-CM | POA: Insufficient documentation

## 2018-08-24 DIAGNOSIS — F329 Major depressive disorder, single episode, unspecified: Secondary | ICD-10-CM | POA: Diagnosis not present

## 2018-08-24 DIAGNOSIS — G4733 Obstructive sleep apnea (adult) (pediatric): Secondary | ICD-10-CM | POA: Insufficient documentation

## 2018-08-24 DIAGNOSIS — I5032 Chronic diastolic (congestive) heart failure: Secondary | ICD-10-CM

## 2018-08-24 DIAGNOSIS — I5042 Chronic combined systolic (congestive) and diastolic (congestive) heart failure: Secondary | ICD-10-CM | POA: Insufficient documentation

## 2018-08-24 DIAGNOSIS — F419 Anxiety disorder, unspecified: Secondary | ICD-10-CM | POA: Insufficient documentation

## 2018-08-24 DIAGNOSIS — M109 Gout, unspecified: Secondary | ICD-10-CM | POA: Diagnosis not present

## 2018-08-24 DIAGNOSIS — Z7901 Long term (current) use of anticoagulants: Secondary | ICD-10-CM | POA: Diagnosis not present

## 2018-08-24 DIAGNOSIS — E1121 Type 2 diabetes mellitus with diabetic nephropathy: Secondary | ICD-10-CM

## 2018-08-24 DIAGNOSIS — F17290 Nicotine dependence, other tobacco product, uncomplicated: Secondary | ICD-10-CM | POA: Insufficient documentation

## 2018-08-24 DIAGNOSIS — R42 Dizziness and giddiness: Secondary | ICD-10-CM | POA: Insufficient documentation

## 2018-08-24 DIAGNOSIS — E1122 Type 2 diabetes mellitus with diabetic chronic kidney disease: Secondary | ICD-10-CM | POA: Insufficient documentation

## 2018-08-24 DIAGNOSIS — R0789 Other chest pain: Secondary | ICD-10-CM | POA: Diagnosis not present

## 2018-08-24 DIAGNOSIS — N186 End stage renal disease: Secondary | ICD-10-CM | POA: Diagnosis not present

## 2018-08-24 DIAGNOSIS — Z8249 Family history of ischemic heart disease and other diseases of the circulatory system: Secondary | ICD-10-CM | POA: Insufficient documentation

## 2018-08-24 DIAGNOSIS — I872 Venous insufficiency (chronic) (peripheral): Secondary | ICD-10-CM | POA: Diagnosis not present

## 2018-08-24 DIAGNOSIS — J45909 Unspecified asthma, uncomplicated: Secondary | ICD-10-CM | POA: Diagnosis not present

## 2018-08-24 DIAGNOSIS — Z794 Long term (current) use of insulin: Secondary | ICD-10-CM | POA: Diagnosis not present

## 2018-08-24 DIAGNOSIS — E785 Hyperlipidemia, unspecified: Secondary | ICD-10-CM | POA: Diagnosis not present

## 2018-08-24 DIAGNOSIS — Z951 Presence of aortocoronary bypass graft: Secondary | ICD-10-CM | POA: Insufficient documentation

## 2018-08-24 DIAGNOSIS — I10 Essential (primary) hypertension: Secondary | ICD-10-CM

## 2018-08-24 DIAGNOSIS — I272 Pulmonary hypertension, unspecified: Secondary | ICD-10-CM | POA: Insufficient documentation

## 2018-08-24 DIAGNOSIS — I2510 Atherosclerotic heart disease of native coronary artery without angina pectoris: Secondary | ICD-10-CM | POA: Diagnosis not present

## 2018-08-24 NOTE — Patient Instructions (Signed)
Contact the [facility] [phone]

## 2018-08-24 NOTE — Progress Notes (Signed)
Patient ID: [name], male    DOB: November 09, 1960, 58 y.o.   MRN: [id number]  HPI  [name] is a 58 y/o with a history of asthma, atrial fibrillation, CAD (CABG), chronic venous stastis of lower legs, depression, DM, renal disease (s/p renal transplant), GERD, HTN, hyperlipidemia, obstructive sleep apnea (with oxygen), remote tobacco use and chronic heart failure.   Echo done 06/02/2018 reviewed and showed an EF of 60-65% with low-normal right ventricular function with a right side pressure of 78 and a moderate dilated left atrium. Echo done 10/16/16 showed an EF of 45-50% along with normal pulmonary artery systolic pressure. EF has improved from 35-40% back in July 2016. Right cardiac catheterization done 11/15/16 showed severely elevated filling pressure, severe pulmonary HTN and low normal cardiac output. Pulmonary wedge pressure still elevated at 31 mmHg.   Was in the ED 02/25/18 due to a fall while riding in a bus. He was evaluated and released.    He presents today for his follow-up visit with a chief complaint of minimal shortness of breath upon moderate exertion. Says that this has been present for several years and will vary in intensity. His breathing is a little bit better than it was a few weeks ago. He has associated cough, chest tightness, pedal edema, abdominal distention and dizziness along with this. He denies any difficulty sleeping, palpitations, fatigue or weight gain. Weight has been fluctuating. He does report feeling some anxiety regarding returning to work at [employer] in the fall. Was placed on torsemide but he didn't feel like it was working so he was put back on furosemide by cardiology.   [facility] and missed his last appointment and needs to R/S it.   Past Medical History:  Diagnosis Date  . Allergy    seasonal  . Amnestic MCI (mild cognitive impairment with memory loss)   . Asthma   . Benign neoplasm of colon   . CAD (coronary  artery disease) 2005   a.  Status post four-vessel CABG in 2005  . Chronic atrial fibrillation    a. CHADS2VASc 4 (CHF, HTN, DM, vascular disease); b. Eliquis  . Chronic combined systolic and diastolic CHF (congestive heart failure) ([place])    a.  TTE 7/18: EF 45-50%, moderately dilated LA, RVSF normal, PASP normal  . Chronic venous stasis dermatitis of both lower extremities   . Cytomegaloviral disease ([place]) 2017  . Depression   . Diabetes mellitus   . Diverticulosis   . Dyspnea   . Erectile dysfunction   . ESRD (end stage renal disease) ([place]) 2005   a. HD 2004-2012; b. S/p renal transplant in 2012  . GERD (gastroesophageal reflux disease)   . Gout   . Hyperlipidemia    low since renal failure  . Hypertension   . Kidney transplant status, cadaveric 2012  . Obesity   . Pneumonia   . Purpura ([place])   . Sleep apnea    bipap with oxygen 2 l  . Trigger finger   . Ulcer 05/2016   Left shin  . Wears glasses    Past Surgical History:  Procedure Laterality Date  . BARIATRIC SURGERY    . CARDIAC CATHETERIZATION     [facility]  . CATARACT EXTRACTION W/PHACO Right 10/29/2017   Procedure: CATARACT EXTRACTION PHACO AND INTRAOCULAR LENS PLACEMENT ([place])  RIGHT DIABETIC;  Surgeon: [name], MD;  Location: [place];  Service: Ophthalmology;  Laterality: Right;  Diabetic -  insulin  . CATARACT EXTRACTION W/PHACO Left 11/18/2017   Procedure: CATARACT EXTRACTION PHACO AND INTRAOCULAR LENS PLACEMENT ([name]) LEFT IVA/TOPICAL;  Surgeon: [name], MD;  Location: [place];  Service: Ophthalmology;  Laterality: Left;  DIABETES - insulin sleep apnea  . COLONOSCOPY WITH PROPOFOL N/A 04/22/2013   Procedure: COLONOSCOPY WITH PROPOFOL;  Surgeon: [name], MD;  [facility];  Service: Endoscopy;  Laterality: N/A;  . CORONARY ARTERY BYPASS GRAFT  2005   X 4  . DG ANGIO AV SHUNT*L*     right and left upper arms  . FASCIOTOMY  03/03/2012   Procedure:  FASCIOTOMY;  Surgeon: [name], MD;  Location: [place];  Service: Orthopedics;  Laterality: Right;  FASCIOTOMY RIGHT SMALL FINGER  . FASCIOTOMY Left 08/17/2013   Procedure: FASCIOTOMY LEFT RING;  Surgeon: [name], MD;  Location: [place];  Service: Orthopedics;  Laterality: Left;  . INCISION AND DRAINAGE ABSCESS Left 10/15/2015   Procedure: INCISION AND DRAINAGE ABSCESS;  Surgeon: [name], MD;  Location: [facility];  Service: General;  Laterality: Left;  . KIDNEY TRANSPLANT  09/13/2010   cadaver--at [facility]  . RIGHT HEART CATH N/A 11/15/2016   Procedure: RIGHT HEART CATH;  Surgeon: [name], MD;  Location: [place] [facility];  Service: Cardiovascular;  Laterality: N/A;  . TYMPANIC MEMBRANE REPAIR  1/12   left  . VASECTOMY     Family History  Problem Relation Age of Onset  . Heart disease Father   . Kidney failure Father   . Kidney disease Father   . Diabetes Maternal Grandmother   . Breast cancer Maternal Grandmother   . Valvular heart disease Mother   . Liver cancer Paternal Uncle   . Liver cancer Paternal Grandmother   . Prostate cancer Neg Hx    Social History   Tobacco Use  . Smoking status: Former Smoker    Types: Cigars    Last attempt to quit: 09/02/1994    Years since quitting: 23.9  . Smokeless tobacco: Never Used  Substance Use Topics  . Alcohol use: Yes    Alcohol/week: 0.0 - 1.0 standard drinks    Comment: occasional- 3 in the past year   Allergies  Allergen Reactions  . Morphine Itching   Prior to Admission medications   Medication Sig Start Date End Date Taking? Authorizing Provider  albuterol (PROVENTIL HFA;VENTOLIN HFA) 108 (90 Base) MCG/ACT inhaler Inhale 2 puffs every 6 (six) hours as needed into the lungs for wheezing or shortness of breath. 02/10/17  Yes [name], MD  AMBULATORY NON FORMULARY MEDICATION Medication Name: Nebulizer 08/13/17  Yes Laverle Hobby, MD  amLODipine (NORVASC)  5 MG tablet Take 1 tablet (5 mg total) by mouth daily. 12/22/17  Yes Yoshino Broccoli, Otila Kluver A, FNP  beclomethasone (QVAR REDIHALER) 80 MCG/ACT inhaler Inhale 2 puffs into the lungs 2 (two) times daily. Rinse mouth after use. 06/15/18 06/15/19 Yes Laverle Hobby, MD  buPROPion (WELLBUTRIN SR) 150 MG 12 hr tablet Take 1 tablet (150 mg total) by mouth 2 (two) times daily. 04/29/18  Yes [name], MD  colchicine 0.6 MG tablet Take 1 tablet (0.6 mg total) by mouth 2 (two) times daily as needed. 03/30/18  Yes [name], MD  ELIQUIS 5 MG TABS tablet TAKE 1 TABLET (5 MG TOTAL) BY MOUTH 2 (TWO) TIMES DAILY. 06/09/18  Yes [name], MD  furosemide (LASIX) 40 MG tablet Take 1.5 tablets (60  mg total) by mouth every morning AND 1 tablet (40 mg total) every evening. Patient taking differently: Take 2 tablets (97m total) by mouth every morning AND 1 tablet (40 mg total) every evening. 07/03/18  Yes MLarey Dresser MD  insulin aspart (NOVOLOG) 100 UNIT/ML injection Inject 1-5 Units into the skin once. Using as sliding scale. Takes 1 unit if is 10 above normal, 5 units if is 50 above normal. Upper limit of normal for him is 120.   Yes [provider]  insulin glargine (LANTUS) 100 unit/mL SOPN Inject 0.1 mLs (10 Units total) into the skin at bedtime. Currently using 15 units 11/16/16  Yes [name] MD  lisinopril (ZESTRIL) 40 MG tablet TAKE 1 TABLET BY MOUTH ONCE DAILY 08/19/18  Yes HDarylene PriceA, FNP  magnesium 30 MG tablet Take 30 mg by mouth daily.   Yes [provider]  metolazone (ZAROXOLYN) 5 MG tablet TAKE 1 TABLET BY MOUTH AS NEEDED 12/03/17  Yes HDarylene PriceA, FNP  mometasone (NASONEX) 50 MCG/ACT nasal spray PLACE 2 SPRAYS INTO THE NOSE AT BEDTIME. 02/03/18  Yes LVenia Carbon MD  montelukast (SINGULAIR) 10 MG tablet TAKE 1 TABLET BY MOUTH AT BEDTIME 06/01/18  Yes RLaverle Hobby MD  Multiple Vitamin (MULTIVITAMIN WITH MINERALS) TABS tablet Take 1 tablet by mouth  daily.   Yes [provider]  mycophenolate (MYFORTIC) 180 MG EC tablet Take 360 mg by mouth 2 (two) times daily.    Yes [provider]  omeprazole (PRILOSEC) 20 MG capsule TAKE 1 CAPSULE BY MOUTH ONCE DAILY 02/21/16  Yes LViviana SimplerI, MD  potassium chloride (MICRO-K) 10 MEQ CR capsule TAKE 2 CAPSULES (20 MEQ TOTAL) BY MOUTH 2 (TWO) TIMES DAILY. 05/18/18  Yes Jen Eppinger, [name], FNP  rosuvastatin (CRESTOR) 10 MG tablet TAKE 1 TABLET BY MOUTH DAILY 06/09/18  Yes AWellington Hampshire MD  sildenafil (VIAGRA) 100 MG tablet TAKE 0.5-1 TABLETS (50-100 MG TOTAL) BY MOUTH DAILY AS NEEDED FOR ERECTILE DYSFUNCTION. 01/28/18  Yes LVenia Carbon MD  spironolactone (ALDACTONE) 25 MG tablet Take 1 tablet (25 mg total) by mouth daily. 11/16/16 08/24/18 Yes [name] MD  tacrolimus (PROGRAF) 0.5 MG capsule Take 1 mg by mouth 2 (two) times daily.   Yes [provider]  tamsulosin (FLOMAX) 0.4 MG CAPS capsule Take 0.4 mg by mouth daily.    Yes [provider]    Review of Systems  Constitutional: Negative for appetite change, fatigue and fever.  HENT: Positive for congestion. Negative for postnasal drip and sore throat.   Eyes: Negative.   Respiratory: Positive for cough (better) and shortness of breath. Negative for chest tightness.   Cardiovascular: Positive for chest pain (at times) and leg swelling. Negative for palpitations.  Gastrointestinal: Positive for abdominal distention. Negative for abdominal pain.  Endocrine: Negative.   Genitourinary: Negative.   Musculoskeletal: Negative for back pain and neck pain.  Skin: Negative.   Allergic/Immunologic: Negative.   Neurological: Positive for dizziness and light-headedness (when bending over too quickly).  Hematological: Negative for adenopathy. Does not bruise/bleed easily.  Psychiatric/Behavioral: Negative for dysphoric mood and sleep disturbance (wearing bipap nightly). The patient is not nervous/anxious.    Vitals:    08/24/18 0858  BP: (!) 150/73  Pulse: (!) 53  Resp: 18  SpO2: 99%  Weight: 266 lb 6 oz (120.8 kg)  Height: _0  (1.778 m)   Wt Readings from Last 3 Encounters:  08/24/18 266 lb 6 oz (120.8 kg)  08/11/18 261 lb (118.4 kg)  07/02/18 256 lb (116.1 kg)   Lab Results  Component Value Date   CREATININE 1.24 06/24/2018   CREATININE 1.43 (H) 05/04/2018   CREATININE 1.23 12/12/2017    Physical Exam  Constitutional: He is oriented to person, place, and time. He appears well-developed and well-nourished.  HENT:  Head: Normocephalic and atraumatic.  Neck: Normal range of motion. Neck supple. No JVD present.  Cardiovascular: Normal rate. An irregular rhythm present.  Pulmonary/Chest: Effort normal. He has no wheezes. He has no rales.  Abdominal: Soft. He exhibits distension. There is no abdominal tenderness.  Musculoskeletal:        General: Edema (trace pitting edema bilateral lower legs) present. No tenderness.  Neurological: He is alert and oriented to person, place, and time.  Skin: Skin is warm and dry.  Psychiatric: He has a normal mood and affect. His behavior is normal. Thought content normal.  Nursing note and vitals reviewed.   Assessment & Plan:  1: Chronic heart failure with preserved ejection fraction- - NYHA class II - euvolemic today based on patient's description of symptoms - continues to weigh daily. Reminded to call for an overnight weight gain of >2 pounds or a weekly weight gain of >5 pounds - weight down 4 pounds from last visit here 6 months ago - hasn't been able to go to the gym due to [place] restrictions - not adding salt and has been reading food labels. Discussed the importance of closely following a 2075m sodium diet - is on spironolactone and potassium; has a history of hypokalemia - saw cardiologist (Fletcher Anon 08/11/2018 - saw [facility] (Aundra Dubin 07/03/2018 (telemedicine) - missed his last visit with Dr. MAundra Dubinas he fell asleep and can't find his #.  Telephone # given to him and he says that he'll call them to schedule an appointment  2: HTN- - BP mildly elevated today  - saw PCP (Silvio Pate 06/30/2018 (telemedicine) - BMP 06/24/2018 reviewed and shows sodium 139, potassium 4.3, creatinine 1.24 and GFR >60 - follows with nephrology (Candiss Norse and has an upcoming appointment with him; [facility]; last seen 01/02/18  3: Diabetes- - saw endocrinologist (SSobieski 02/17/18 - glucose at home yesterday was 240 - A1c was 9.5% from 06/04/2018  Patient did not bring his medications nor a list. Each medication was verbally reviewed with the patient and he was encouraged to bring the bottles to every visit to confirm accuracy of list.  [facility], will not make a follow-up appointment. Advised patient that we could make an [place]

## 2018-08-26 ENCOUNTER — Other Ambulatory Visit: Payer: 59

## 2018-08-26 ENCOUNTER — Encounter: Payer: Self-pay | Admitting: Family Medicine

## 2018-08-26 ENCOUNTER — Telehealth: Payer: Self-pay | Admitting: *Deleted

## 2018-08-26 ENCOUNTER — Ambulatory Visit (INDEPENDENT_AMBULATORY_CARE_PROVIDER_SITE_OTHER): Payer: 59 | Admitting: Family Medicine

## 2018-08-26 VITALS — HR 62

## 2018-08-26 DIAGNOSIS — R05 Cough: Secondary | ICD-10-CM | POA: Diagnosis not present

## 2018-08-26 DIAGNOSIS — J309 Allergic rhinitis, unspecified: Secondary | ICD-10-CM

## 2018-08-26 DIAGNOSIS — E662 Morbid (severe) obesity with alveolar hypoventilation: Secondary | ICD-10-CM | POA: Diagnosis not present

## 2018-08-26 DIAGNOSIS — R6889 Other general symptoms and signs: Secondary | ICD-10-CM | POA: Diagnosis not present

## 2018-08-26 DIAGNOSIS — Z20822 Contact with and (suspected) exposure to covid-19: Secondary | ICD-10-CM

## 2018-08-26 DIAGNOSIS — I2510 Atherosclerotic heart disease of native coronary artery without angina pectoris: Secondary | ICD-10-CM | POA: Diagnosis not present

## 2018-08-26 DIAGNOSIS — G4733 Obstructive sleep apnea (adult) (pediatric): Secondary | ICD-10-CM | POA: Diagnosis not present

## 2018-08-26 DIAGNOSIS — R059 Cough, unspecified: Secondary | ICD-10-CM

## 2018-08-26 MED ORDER — FLUTICASONE PROPIONATE 50 MCG/ACT NA SUSP: 2.0000 | Freq: Every day | NASAL | 6 refills | Status: DC

## 2018-08-26 NOTE — Progress Notes (Signed)
Virtual Visit via Video Note  I connected with [name] on 08/26/18 at  2:30 PM EDT by a video enabled telemedicine application and verified that I am speaking with the correct person using two identifiers.  Location: Patient: [name] Provider: [place]   I discussed the limitations of evaluation and management by telemedicine and the availability of Carl person appointments. The patient expressed understanding and agreed to proceed.  History of Present Illness: This is a 58 year old male who requests virtual visit today to discuss recent onset symptoms.  He has had a cough with clear sputum, sore throat, nasal congestion and subjective fever with chills for 2 days.  He feels little more short of breath than normal.  He takes Singulair for his asthma as well as Nasonex nasal spray for seasonal allergies.  He has been outside some this week.  His wife works at [employer].  He was seen Carl the [facility] heart failure clinic 2 days ago, this was prior to his cough starting.  His lungs were clear at that time. He reports that Nasonex gives him a bad smell Carl his nose and he requests something different.  He does not believe he is ever tried fluticasone before.  Past Medical History:  Diagnosis Date  . Allergy    seasonal  . Amnestic MCI (mild cognitive impairment with memory loss)   . Asthma   . Benign neoplasm of colon   . CAD (coronary artery disease) 2005   a.  Status post four-vessel CABG Carl 2005  . Chronic atrial fibrillation    a. CHADS2VASc 4 (CHF, HTN, DM, vascular disease); b. Eliquis  . Chronic combined systolic and diastolic CHF (congestive heart failure) ([place])    a.  TTE 7/18: EF 45-50%, moderately dilated LA, RVSF normal, PASP normal  . Chronic venous stasis dermatitis of both lower extremities   . Cytomegaloviral disease ([place]) 2017  . Depression   . Diabetes mellitus   . Diverticulosis   . Dyspnea   . Erectile dysfunction   . ESRD (end stage renal disease) ([place]) 2005    a. HD 2004-2012; b. S/p renal transplant Carl 2012  . GERD (gastroesophageal reflux disease)   . Gout   . Hyperlipidemia    low since renal failure  . Hypertension   . Kidney transplant status, cadaveric 2012  . Obesity   . Pneumonia   . Purpura ([place])   . Sleep apnea    bipap with oxygen 2 l  . Trigger finger   . Ulcer 05/2016   Left shin  . Wears glasses    Past Surgical History:  Procedure Laterality Date  . BARIATRIC SURGERY    . CARDIAC CATHETERIZATION     [facility]  . CATARACT EXTRACTION W/PHACO Right 10/29/2017   Procedure: CATARACT EXTRACTION PHACO AND INTRAOCULAR LENS PLACEMENT ([place])  RIGHT DIABETIC;  Surgeon: [name], MD;  Location: [place];  Service: Ophthalmology;  Laterality: Right;  Diabetic - insulin  . CATARACT EXTRACTION W/PHACO Left 11/18/2017   Procedure: CATARACT EXTRACTION PHACO AND INTRAOCULAR LENS PLACEMENT ([place]) LEFT IVA/TOPICAL;  Surgeon: [name], MD;  Location: [place];  Service: Ophthalmology;  Laterality: Left;  DIABETES - insulin sleep apnea  . COLONOSCOPY WITH PROPOFOL N/A 04/22/2013   Procedure: COLONOSCOPY WITH PROPOFOL;  Surgeon: [name], MD;  [facility];  Service: Endoscopy;  Laterality: N/A;  . CORONARY ARTERY BYPASS GRAFT  2005   X 4  . DG ANGIO AV SHUNT*L*  right and left upper arms  . FASCIOTOMY  03/03/2012   Procedure: FASCIOTOMY;  Surgeon: [name], MD;  Location: [place];  Service: Orthopedics;  Laterality: Right;  FASCIOTOMY RIGHT SMALL FINGER  . FASCIOTOMY Left 08/17/2013   Procedure: FASCIOTOMY LEFT RING;  Surgeon: [name], MD;  Location: [place];  Service: Orthopedics;  Laterality: Left;  . INCISION AND DRAINAGE ABSCESS Left 10/15/2015   Procedure: INCISION AND DRAINAGE ABSCESS;  Surgeon: [name], MD;  Location: [facility];  Service: General;  Laterality: Left;  . KIDNEY TRANSPLANT  09/13/2010   cadaver--at [facility]  . RIGHT  HEART CATH N/A 11/15/2016   Procedure: RIGHT HEART CATH;  Surgeon: [name], MD;  Location: [place] [facility];  Service: Cardiovascular;  Laterality: N/A;  . TYMPANIC MEMBRANE REPAIR  1/12   left  . VASECTOMY     Family History  Problem Relation Age of Onset  . Heart disease Father   . Kidney failure Father   . Kidney disease Father   . Diabetes Maternal Grandmother   . Breast cancer Maternal Grandmother   . Valvular heart disease Mother   . Liver cancer Paternal Uncle   . Liver cancer Paternal Grandmother   . Prostate cancer Neg Hx    Social History   Tobacco Use  . Smoking status: Former Smoker    Types: Cigars    Last attempt to quit: 09/02/1994    Years since quitting: 23.9  . Smokeless tobacco: Never Used  Substance Use Topics  . Alcohol use: Yes    Alcohol/week: 0.0 - 1.0 standard drinks    Comment: occasional- 3 Carl the past year  . Drug use: No      Observations/Objective: Patient is alert and answers questions appropriately.  Visible skin is unremarkable.  He is normally conversive without shortness of breath.  No audible wheezes.  Witnessed occasional cough.  Mood and affect are appropriate. Pulse 62 Comment: per patient's [name] pulse ox  SpO2 95%   Wt Readings from Last 3 Encounters:  08/24/18 266 lb 6 oz (120.8 kg)  08/11/18 261 lb (118.4 kg)  07/02/18 256 lb (116.1 kg)   BP Readings from Last 3 Encounters:  08/24/18 (!) 150/73  08/11/18 140/74  06/30/18 140/87    Assessment and Plan: 1. Cough -Unclear etiology do not suspect bacterial nature.  Symptoms are concerning for possible coronavirus.  Referral placed for him to have testing. -Strict follow-up precautions were reviewed with patient as well as sent him via his MyChart including worsening shortness of breath. -Recommendations for over-the-counter cough suppressant sent to patient via my chart - activated mychart [name] monitoring  2. Allergic rhinitis, unspecified seasonality,  unspecified trigger - fluticasone (FLONASE) 50 MCG/ACT nasal spray; Place 2 sprays into both nostrils daily.  Dispense: 16 g; Refill: [place], FNP-BC  [facility], [place]  08/26/2018 3:58 PM   Follow Up Instructions: Visit recap sent to patient via my chart.   I discussed the assessment and treatment plan with the patient. The patient was provided an opportunity to ask questions and all were answered. The patient agreed with the plan and demonstrated an understanding of the instructions.   The patient was advised to call back or seek an Carl-person evaluation if the symptoms worsen or if the condition fails to improve as anticipated.   [place]by [name], FNP

## 2018-08-26 NOTE — Telephone Encounter (Signed)
Received request via Epic from Dahlia Client, FNP-BC requesting this pt be tested for cough, sore throat, subjective fever and chills for 2 days.  He is high risk with renal transplant, diabetes, and CHF  I scheduled him at the [facility] [place] for today at 3:45.   I made him aware to stay in car and wear a mask.  I entered the order.

## 2018-08-28 LAB — NOVEL CORONAVIRUS, NAA: SARS-CoV-2, NAA: NOT DETECTED

## 2018-09-03 ENCOUNTER — Other Ambulatory Visit: Payer: Self-pay | Admitting: Internal Medicine

## 2018-09-03 DIAGNOSIS — E113293 Type 2 diabetes mellitus with mild nonproliferative diabetic retinopathy without macular edema, bilateral: Secondary | ICD-10-CM | POA: Diagnosis not present

## 2018-09-03 DIAGNOSIS — I10 Essential (primary) hypertension: Secondary | ICD-10-CM | POA: Diagnosis not present

## 2018-09-03 DIAGNOSIS — E669 Obesity, unspecified: Secondary | ICD-10-CM | POA: Diagnosis not present

## 2018-09-03 DIAGNOSIS — Z794 Long term (current) use of insulin: Secondary | ICD-10-CM | POA: Diagnosis not present

## 2018-09-03 DIAGNOSIS — E1165 Type 2 diabetes mellitus with hyperglycemia: Secondary | ICD-10-CM | POA: Diagnosis not present

## 2018-09-03 DIAGNOSIS — E1169 Type 2 diabetes mellitus with other specified complication: Secondary | ICD-10-CM | POA: Diagnosis not present

## 2018-09-18 MED FILL — ELIQUIS 5 MG TABLET: 5 | 90 days supply | Qty: 180 | Fill #0

## 2018-09-18 MED FILL — ROSUVASTATIN CALCIUM 10 MG: 10 | 90 days supply | Qty: 90 | Fill #0

## 2018-09-18 MED FILL — AMLODIPINE BESYLATE 5 MG TA: 5 | 90 days supply | Qty: 90 | Fill #0

## 2018-09-18 MED FILL — MONTELUKAST SOD 10 MG TAB: 10 | 90 days supply | Qty: 90 | Fill #0

## 2018-09-18 MED FILL — TACROLIMUS ANHYDROUS 0.5MG: 0.5 | 30 days supply | Qty: 180 | Fill #2

## 2018-09-25 DIAGNOSIS — E662 Morbid (severe) obesity with alveolar hypoventilation: Secondary | ICD-10-CM | POA: Diagnosis not present

## 2018-09-25 DIAGNOSIS — G4733 Obstructive sleep apnea (adult) (pediatric): Secondary | ICD-10-CM | POA: Diagnosis not present

## 2018-09-28 ENCOUNTER — Other Ambulatory Visit: Payer: Self-pay | Admitting: *Deleted

## 2018-09-28 ENCOUNTER — Other Ambulatory Visit: Payer: Self-pay

## 2018-09-28 MED FILL — FLUTICASONE PROP 50 MCG SPR: 50 | 90 days supply | Qty: 48 | Fill #0

## 2018-10-14 MED FILL — FARXIGA 10 MG TABLET: 10 | 90 days supply | Qty: 90 | Fill #0

## 2018-10-14 MED FILL — TAMSULOSIN HCL 0.4 MG CAP: 0.4 | 30 days supply | Qty: 30 | Fill #0

## 2018-10-14 MED FILL — OMEPRAZOLE 20 MG CPDR: 20 | 90 days supply | Qty: 90 | Fill #0

## 2018-10-14 MED FILL — BUPROPION HCL SR 150 MG TAB: 150 | 30 days supply | Qty: 60 | Fill #0

## 2018-10-15 DIAGNOSIS — Z4822 Encounter for aftercare following kidney transplant: Secondary | ICD-10-CM | POA: Diagnosis not present

## 2018-10-15 DIAGNOSIS — Z7901 Long term (current) use of anticoagulants: Secondary | ICD-10-CM | POA: Diagnosis not present

## 2018-10-15 DIAGNOSIS — N2889 Other specified disorders of kidney and ureter: Secondary | ICD-10-CM | POA: Diagnosis not present

## 2018-10-15 DIAGNOSIS — D649 Anemia, unspecified: Secondary | ICD-10-CM | POA: Diagnosis not present

## 2018-10-15 DIAGNOSIS — Z79899 Other long term (current) drug therapy: Secondary | ICD-10-CM | POA: Diagnosis not present

## 2018-10-15 DIAGNOSIS — E119 Type 2 diabetes mellitus without complications: Secondary | ICD-10-CM | POA: Diagnosis not present

## 2018-10-15 DIAGNOSIS — Z940 Kidney transplant status: Secondary | ICD-10-CM | POA: Diagnosis not present

## 2018-10-15 DIAGNOSIS — I110 Hypertensive heart disease with heart failure: Secondary | ICD-10-CM | POA: Diagnosis not present

## 2018-10-15 DIAGNOSIS — I5032 Chronic diastolic (congestive) heart failure: Secondary | ICD-10-CM | POA: Diagnosis not present

## 2018-10-15 DIAGNOSIS — I151 Hypertension secondary to other renal disorders: Secondary | ICD-10-CM | POA: Diagnosis not present

## 2018-10-15 DIAGNOSIS — G4733 Obstructive sleep apnea (adult) (pediatric): Secondary | ICD-10-CM | POA: Diagnosis not present

## 2018-10-15 DIAGNOSIS — E1165 Type 2 diabetes mellitus with hyperglycemia: Secondary | ICD-10-CM | POA: Diagnosis not present

## 2018-10-15 DIAGNOSIS — Z5181 Encounter for therapeutic drug level monitoring: Secondary | ICD-10-CM | POA: Diagnosis not present

## 2018-10-15 DIAGNOSIS — Z794 Long term (current) use of insulin: Secondary | ICD-10-CM | POA: Diagnosis not present

## 2018-10-15 DIAGNOSIS — E876 Hypokalemia: Secondary | ICD-10-CM | POA: Diagnosis not present

## 2018-10-15 DIAGNOSIS — I482 Chronic atrial fibrillation, unspecified: Secondary | ICD-10-CM | POA: Diagnosis not present

## 2018-10-21 DIAGNOSIS — I509 Heart failure, unspecified: Secondary | ICD-10-CM | POA: Diagnosis not present

## 2018-10-21 DIAGNOSIS — E119 Type 2 diabetes mellitus without complications: Secondary | ICD-10-CM | POA: Diagnosis not present

## 2018-10-21 DIAGNOSIS — Z4822 Encounter for aftercare following kidney transplant: Secondary | ICD-10-CM | POA: Diagnosis not present

## 2018-10-21 DIAGNOSIS — I272 Pulmonary hypertension, unspecified: Secondary | ICD-10-CM | POA: Diagnosis not present

## 2018-10-26 DIAGNOSIS — G4733 Obstructive sleep apnea (adult) (pediatric): Secondary | ICD-10-CM | POA: Diagnosis not present

## 2018-10-26 DIAGNOSIS — E662 Morbid (severe) obesity with alveolar hypoventilation: Secondary | ICD-10-CM | POA: Diagnosis not present

## 2018-11-05 ENCOUNTER — Emergency Department: Payer: 59

## 2018-11-05 ENCOUNTER — Inpatient Hospital Stay
Admission: EM | Admit: 2018-11-05 | Discharge: 2018-11-06 | DRG: 292 | Disposition: A | Discharge status: Home / Self Care | Payer: 59 | Attending: Internal Medicine | Admitting: Internal Medicine

## 2018-11-05 ENCOUNTER — Inpatient Hospital Stay: Payer: 59

## 2018-11-05 ENCOUNTER — Encounter: Payer: Self-pay | Admitting: Emergency Medicine

## 2018-11-05 ENCOUNTER — Other Ambulatory Visit: Payer: Self-pay

## 2018-11-05 DIAGNOSIS — E785 Hyperlipidemia, unspecified: Secondary | ICD-10-CM | POA: Diagnosis present

## 2018-11-05 DIAGNOSIS — I10 Essential (primary) hypertension: Secondary | ICD-10-CM | POA: Diagnosis not present

## 2018-11-05 DIAGNOSIS — I454 Nonspecific intraventricular block: Secondary | ICD-10-CM | POA: Diagnosis present

## 2018-11-05 DIAGNOSIS — R531 Weakness: Secondary | ICD-10-CM | POA: Diagnosis not present

## 2018-11-05 DIAGNOSIS — Z951 Presence of aortocoronary bypass graft: Secondary | ICD-10-CM | POA: Diagnosis not present

## 2018-11-05 DIAGNOSIS — I482 Chronic atrial fibrillation, unspecified: Secondary | ICD-10-CM | POA: Diagnosis present

## 2018-11-05 DIAGNOSIS — Z79899 Other long term (current) drug therapy: Secondary | ICD-10-CM | POA: Diagnosis not present

## 2018-11-05 DIAGNOSIS — I2510 Atherosclerotic heart disease of native coronary artery without angina pectoris: Secondary | ICD-10-CM | POA: Diagnosis present

## 2018-11-05 DIAGNOSIS — E662 Morbid (severe) obesity with alveolar hypoventilation: Secondary | ICD-10-CM | POA: Diagnosis present

## 2018-11-05 DIAGNOSIS — I509 Heart failure, unspecified: Secondary | ICD-10-CM

## 2018-11-05 DIAGNOSIS — M109 Gout, unspecified: Secondary | ICD-10-CM | POA: Diagnosis present

## 2018-11-05 DIAGNOSIS — R509 Fever, unspecified: Secondary | ICD-10-CM

## 2018-11-05 DIAGNOSIS — Z833 Family history of diabetes mellitus: Secondary | ICD-10-CM | POA: Diagnosis not present

## 2018-11-05 DIAGNOSIS — Z20828 Contact with and (suspected) exposure to other viral communicable diseases: Secondary | ICD-10-CM | POA: Diagnosis present

## 2018-11-05 DIAGNOSIS — I5033 Acute on chronic diastolic (congestive) heart failure: Secondary | ICD-10-CM | POA: Diagnosis present

## 2018-11-05 DIAGNOSIS — I872 Venous insufficiency (chronic) (peripheral): Secondary | ICD-10-CM | POA: Diagnosis present

## 2018-11-05 DIAGNOSIS — I110 Hypertensive heart disease with heart failure: Principal | ICD-10-CM | POA: Diagnosis present

## 2018-11-05 DIAGNOSIS — R0602 Shortness of breath: Secondary | ICD-10-CM | POA: Diagnosis not present

## 2018-11-05 DIAGNOSIS — Z940 Kidney transplant status: Secondary | ICD-10-CM | POA: Diagnosis not present

## 2018-11-05 DIAGNOSIS — E1139 Type 2 diabetes mellitus with other diabetic ophthalmic complication: Secondary | ICD-10-CM | POA: Diagnosis present

## 2018-11-05 DIAGNOSIS — R51 Headache: Secondary | ICD-10-CM | POA: Diagnosis not present

## 2018-11-05 DIAGNOSIS — Z7901 Long term (current) use of anticoagulants: Secondary | ICD-10-CM

## 2018-11-05 DIAGNOSIS — R05 Cough: Secondary | ICD-10-CM | POA: Diagnosis not present

## 2018-11-05 DIAGNOSIS — Z794 Long term (current) use of insulin: Secondary | ICD-10-CM | POA: Diagnosis not present

## 2018-11-05 DIAGNOSIS — Z6838 Body mass index (BMI) 38.0-38.9, adult: Secondary | ICD-10-CM

## 2018-11-05 DIAGNOSIS — F329 Major depressive disorder, single episode, unspecified: Secondary | ICD-10-CM | POA: Diagnosis not present

## 2018-11-05 DIAGNOSIS — Z7951 Long term (current) use of inhaled steroids: Secondary | ICD-10-CM | POA: Diagnosis not present

## 2018-11-05 DIAGNOSIS — K219 Gastro-esophageal reflux disease without esophagitis: Secondary | ICD-10-CM | POA: Diagnosis present

## 2018-11-05 DIAGNOSIS — Z87891 Personal history of nicotine dependence: Secondary | ICD-10-CM

## 2018-11-05 DIAGNOSIS — G4730 Sleep apnea, unspecified: Secondary | ICD-10-CM | POA: Diagnosis present

## 2018-11-05 DIAGNOSIS — J45909 Unspecified asthma, uncomplicated: Secondary | ICD-10-CM | POA: Diagnosis not present

## 2018-11-05 DIAGNOSIS — R42 Dizziness and giddiness: Secondary | ICD-10-CM | POA: Diagnosis not present

## 2018-11-05 DIAGNOSIS — E119 Type 2 diabetes mellitus without complications: Secondary | ICD-10-CM | POA: Diagnosis not present

## 2018-11-05 HISTORY — DX: Heart failure, unspecified: I50.9

## 2018-11-05 LAB — CBC WITH DIFFERENTIAL/PLATELET
Abs Immature Granulocytes: 0.03 10*3/uL (ref 0.00–0.07)
Basophils Absolute: 0 10*3/uL (ref 0.0–0.1)
Basophils Relative: 1 %
Eosinophils Absolute: 0 10*3/uL (ref 0.0–0.5)
Eosinophils Relative: 0 %
HCT: 36.6 % — ABNORMAL LOW (ref 39.0–52.0)
Hemoglobin: 11.1 g/dL — ABNORMAL LOW (ref 13.0–17.0)
Immature Granulocytes: 1 %
Lymphocytes Relative: 19 %
Lymphs Abs: 1.3 10*3/uL (ref 0.7–4.0)
MCH: 22.5 pg — ABNORMAL LOW (ref 26.0–34.0)
MCHC: 30.3 g/dL (ref 30.0–36.0)
MCV: 74.1 fL — ABNORMAL LOW (ref 80.0–100.0)
Monocytes Absolute: 0.5 10*3/uL (ref 0.1–1.0)
Monocytes Relative: 7 %
Neutro Abs: 4.7 10*3/uL (ref 1.7–7.7)
Neutrophils Relative %: 72 %
Platelets: 133 10*3/uL — ABNORMAL LOW (ref 150–400)
RBC: 4.94 MIL/uL (ref 4.22–5.81)
RDW: 16.8 % — ABNORMAL HIGH (ref 11.5–15.5)
WBC: 6.5 10*3/uL (ref 4.0–10.5)
nRBC: 0 % (ref 0.0–0.2)

## 2018-11-05 LAB — PROCALCITONIN: Procalcitonin: <0.1 ng/mL

## 2018-11-05 LAB — SARS CORONAVIRUS 2 BY RT PCR (HOSPITAL ORDER, PERFORMED IN ~~LOC~~ HOSPITAL LAB): SARS Coronavirus 2: NEGATIVE

## 2018-11-05 LAB — URINALYSIS, COMPLETE (UACMP) WITH MICROSCOPIC
Bacteria, UA: NONE SEEN
Bilirubin Urine: NEGATIVE
Glucose, UA: >=500 mg/dL — AB
Ketones, ur: NEGATIVE mg/dL
Leukocytes,Ua: NEGATIVE
Nitrite: NEGATIVE
Protein, ur: 30 mg/dL — AB
Specific Gravity, Urine: 1.017 (ref 1.005–1.030)
Squamous Epithelial / HPF: NONE SEEN (ref 0–5)
pH: 5 (ref 5.0–8.0)

## 2018-11-05 LAB — HEMOGLOBIN A1C
Hgb A1c MFr Bld: 10.4 % — ABNORMAL HIGH (ref 4.8–5.6)
Mean Plasma Glucose: 251.78 mg/dL

## 2018-11-05 LAB — COMPREHENSIVE METABOLIC PANEL
ALT: 12 U/L (ref 0–44)
AST: 19 U/L (ref 15–41)
Albumin: 3.8 g/dL (ref 3.5–5.0)
Alkaline Phosphatase: 83 U/L (ref 38–126)
Anion gap: 12 (ref 5–15)
BUN: 40 mg/dL — ABNORMAL HIGH (ref 6–20)
CO2: 29 mmol/L (ref 22–32)
Calcium: 9.7 mg/dL (ref 8.9–10.3)
Chloride: 95 mmol/L — ABNORMAL LOW (ref 98–111)
Creatinine, Ser: 1.55 mg/dL — ABNORMAL HIGH (ref 0.61–1.24)
GFR calc Af Amer: 56 mL/min — ABNORMAL LOW (ref >60)
GFR calc non Af Amer: 48 mL/min — ABNORMAL LOW (ref >60)
Glucose, Bld: 228 mg/dL — ABNORMAL HIGH (ref 70–99)
Potassium: 3.9 mmol/L (ref 3.5–5.1)
Sodium: 136 mmol/L (ref 135–145)
Total Bilirubin: 1.1 mg/dL (ref 0.3–1.2)
Total Protein: 7.7 g/dL (ref 6.5–8.1)

## 2018-11-05 LAB — LACTIC ACID, PLASMA
Lactic Acid, Venous: 1 mmol/L (ref 0.5–1.9)
Lactic Acid, Venous: 1.2 mmol/L (ref 0.5–1.9)

## 2018-11-05 LAB — TROPONIN I (HIGH SENSITIVITY)
Troponin I (High Sensitivity): 34 ng/L — ABNORMAL HIGH (ref <18)
Troponin I (High Sensitivity): 35 ng/L — ABNORMAL HIGH (ref <18)

## 2018-11-05 LAB — GLUCOSE, CAPILLARY
Glucose-Capillary: 254 mg/dL — ABNORMAL HIGH (ref 70–99)
Glucose-Capillary: 201 mg/dL — ABNORMAL HIGH (ref 70–99)

## 2018-11-05 MED ORDER — SODIUM CHLORIDE 0.9 % IV SOLN
2.0000 g | Freq: Once | INTRAVENOUS | Status: AC
  Administered 2018-11-05: 2 g via INTRAVENOUS
  Filled 2018-11-05: qty 2

## 2018-11-05 MED ORDER — SODIUM CHLORIDE 0.9 % IV SOLN
500.0000 mg | Freq: Once | INTRAVENOUS | Status: AC
  Administered 2018-11-05: 500 mg via INTRAVENOUS
  Filled 2018-11-05: qty 500

## 2018-11-05 MED ORDER — ALBUTEROL SULFATE (2.5 MG/3ML) 0.083% IN NEBU: 3.0000 mL | INHALATION_SOLUTION | Freq: Four times a day (QID) | RESPIRATORY_TRACT | Status: DC | PRN

## 2018-11-05 MED ORDER — SODIUM CHLORIDE 0.9% FLUSH: 3.0000 mL | INTRAVENOUS | Status: DC | PRN

## 2018-11-05 MED ORDER — FENTANYL CITRATE (PF) 100 MCG/2ML IJ SOLN
50.0000 ug | Freq: Once | INTRAMUSCULAR | Status: AC
  Administered 2018-11-05: 50 ug via INTRAVENOUS
  Filled 2018-11-05: qty 2

## 2018-11-05 MED ORDER — ACETAMINOPHEN 325 MG PO TABS
650.0000 mg | ORAL_TABLET | ORAL | Status: DC | PRN
  Administered 2018-11-05 – 2018-11-06 (×3): 650 mg via ORAL
  Filled 2018-11-05 (×3): qty 2

## 2018-11-05 MED ORDER — SPIRONOLACTONE 25 MG PO TABS
25.0000 mg | ORAL_TABLET | Freq: Every day | ORAL | Status: DC
  Administered 2018-11-05 – 2018-11-06 (×2): 25 mg via ORAL
  Filled 2018-11-05 (×2): qty 1

## 2018-11-05 MED ORDER — BUDESONIDE 0.25 MG/2ML IN SUSP
0.2500 mg | Freq: Two times a day (BID) | RESPIRATORY_TRACT | Status: DC
  Administered 2018-11-05 – 2018-11-06 (×2): 0.25 mg via RESPIRATORY_TRACT
  Filled 2018-11-05 (×2): qty 2

## 2018-11-05 MED ORDER — MYCOPHENOLATE SODIUM 180 MG PO TBEC
360.0000 mg | DELAYED_RELEASE_TABLET | Freq: Two times a day (BID) | ORAL | Status: DC
  Administered 2018-11-05 – 2018-11-06 (×3): 360 mg via ORAL
  Filled 2018-11-05 (×4): qty 2

## 2018-11-05 MED ORDER — APIXABAN 5 MG PO TABS
5.0000 mg | ORAL_TABLET | Freq: Two times a day (BID) | ORAL | Status: DC
  Administered 2018-11-05 – 2018-11-06 (×3): 5 mg via ORAL
  Filled 2018-11-05 (×3): qty 1

## 2018-11-05 MED ORDER — SODIUM CHLORIDE 0.9 % IV SOLN
250.0000 mL | INTRAVENOUS | Status: DC | PRN
  Administered 2018-11-05: 250 mL via INTRAVENOUS

## 2018-11-05 MED ORDER — MAGNESIUM 30 MG PO TABS: 30.0000 mg | ORAL_TABLET | Freq: Every day | ORAL | Status: DC

## 2018-11-05 MED ORDER — ONDANSETRON HCL 4 MG/2ML IJ SOLN: 4.0000 mg | Freq: Four times a day (QID) | INTRAMUSCULAR | Status: DC | PRN

## 2018-11-05 MED ORDER — MONTELUKAST SODIUM 10 MG PO TABS
10.0000 mg | ORAL_TABLET | Freq: Every day | ORAL | Status: DC
  Administered 2018-11-05: 10 mg via ORAL
  Filled 2018-11-05: qty 1

## 2018-11-05 MED ORDER — VANCOMYCIN HCL 1.5 G IV SOLR
1500.0000 mg | Freq: Once | INTRAVENOUS | Status: AC
  Administered 2018-11-05: 1500 mg via INTRAVENOUS
  Filled 2018-11-05: qty 1500

## 2018-11-05 MED ORDER — BECLOMETHASONE DIPROP HFA 80 MCG/ACT IN AERB: 2.0000 | INHALATION_SPRAY | Freq: Two times a day (BID) | RESPIRATORY_TRACT | Status: DC

## 2018-11-05 MED ORDER — VANCOMYCIN HCL IN DEXTROSE 1-5 GM/200ML-% IV SOLN
1000.0000 mg | Freq: Once | INTRAVENOUS | Status: AC
  Administered 2018-11-05: 1000 mg via INTRAVENOUS
  Filled 2018-11-05: qty 200

## 2018-11-05 MED ORDER — SODIUM CHLORIDE 0.9 % IV SOLN
1.0000 g | INTRAVENOUS | Status: DC
  Administered 2018-11-05: 1 g via INTRAVENOUS
  Filled 2018-11-05: qty 10
  Filled 2018-11-05: qty 1

## 2018-11-05 MED ORDER — INSULIN GLARGINE 100 UNIT/ML ~~LOC~~ SOLN
10.0000 [IU] | Freq: Every day | SUBCUTANEOUS | Status: DC
  Administered 2018-11-05: 10 [IU] via SUBCUTANEOUS
  Filled 2018-11-05 (×2): qty 0.1

## 2018-11-05 MED ORDER — INSULIN ASPART 100 UNIT/ML ~~LOC~~ SOLN
0.0000 [IU] | Freq: Three times a day (TID) | SUBCUTANEOUS | Status: DC
  Administered 2018-11-05: 5 [IU] via SUBCUTANEOUS
  Administered 2018-11-06: 3 [IU] via SUBCUTANEOUS
  Administered 2018-11-06: 2 [IU] via SUBCUTANEOUS
  Filled 2018-11-05 (×3): qty 1

## 2018-11-05 MED ORDER — FUROSEMIDE 10 MG/ML IJ SOLN
20.0000 mg | Freq: Two times a day (BID) | INTRAMUSCULAR | Status: DC
  Administered 2018-11-05: 20 mg via INTRAVENOUS
  Filled 2018-11-05: qty 2

## 2018-11-05 MED ORDER — SODIUM CHLORIDE 0.9 % IV BOLUS
500.0000 mL | Freq: Once | INTRAVENOUS | Status: AC
  Administered 2018-11-05: 500 mL via INTRAVENOUS

## 2018-11-05 MED ORDER — ADULT MULTIVITAMIN W/MINERALS CH
1.0000 | ORAL_TABLET | Freq: Every day | ORAL | Status: DC
  Administered 2018-11-05 – 2018-11-06 (×2): 1 via ORAL
  Filled 2018-11-05 (×2): qty 1

## 2018-11-05 MED ORDER — INSULIN ASPART 100 UNIT/ML ~~LOC~~ SOLN
0.0000 [IU] | Freq: Every day | SUBCUTANEOUS | Status: DC
  Administered 2018-11-05: 2 [IU] via SUBCUTANEOUS
  Filled 2018-11-05: qty 1

## 2018-11-05 MED ORDER — BUPROPION HCL ER (SR) 150 MG PO TB12
150.0000 mg | ORAL_TABLET | Freq: Two times a day (BID) | ORAL | Status: DC
  Administered 2018-11-05 – 2018-11-06 (×3): 150 mg via ORAL
  Filled 2018-11-05 (×4): qty 1

## 2018-11-05 MED ORDER — TAMSULOSIN HCL 0.4 MG PO CAPS
0.4000 mg | ORAL_CAPSULE | Freq: Every day | ORAL | Status: DC
  Administered 2018-11-05 – 2018-11-06 (×2): 0.4 mg via ORAL
  Filled 2018-11-05 (×2): qty 1

## 2018-11-05 MED ORDER — PANTOPRAZOLE SODIUM 40 MG PO TBEC
40.0000 mg | DELAYED_RELEASE_TABLET | Freq: Every day | ORAL | Status: DC
  Administered 2018-11-05 – 2018-11-06 (×2): 40 mg via ORAL
  Filled 2018-11-05 (×2): qty 1

## 2018-11-05 MED ORDER — TACROLIMUS 1 MG PO CAPS
1.0000 mg | ORAL_CAPSULE | Freq: Two times a day (BID) | ORAL | Status: DC
  Administered 2018-11-05 – 2018-11-06 (×3): 1 mg via ORAL
  Filled 2018-11-05 (×4): qty 1

## 2018-11-05 MED ORDER — LISINOPRIL 20 MG PO TABS
40.0000 mg | ORAL_TABLET | Freq: Every day | ORAL | Status: DC
  Administered 2018-11-05 – 2018-11-06 (×2): 40 mg via ORAL
  Filled 2018-11-05 (×2): qty 2

## 2018-11-05 MED ORDER — SILDENAFIL CITRATE 100 MG PO TABS: 50.0000 mg | ORAL_TABLET | Freq: Every day | ORAL | Status: DC | PRN

## 2018-11-05 MED ORDER — SODIUM CHLORIDE 0.9 % IV SOLN
500.0000 mg | INTRAVENOUS | Status: DC
  Administered 2018-11-06: 500 mg via INTRAVENOUS
  Filled 2018-11-05: qty 500

## 2018-11-05 MED ORDER — SODIUM CHLORIDE 0.9% FLUSH
3.0000 mL | Freq: Two times a day (BID) | INTRAVENOUS | Status: DC
  Administered 2018-11-05 – 2018-11-06 (×3): 3 mL via INTRAVENOUS

## 2018-11-05 MED ORDER — POTASSIUM CHLORIDE CRYS ER 10 MEQ PO TBCR
40.0000 meq | EXTENDED_RELEASE_TABLET | Freq: Every day | ORAL | Status: DC
  Administered 2018-11-05 – 2018-11-06 (×2): 40 meq via ORAL
  Filled 2018-11-05: qty 4
  Filled 2018-11-05: qty 2

## 2018-11-05 MED ORDER — AMLODIPINE BESYLATE 5 MG PO TABS
5.0000 mg | ORAL_TABLET | Freq: Every day | ORAL | Status: DC
  Administered 2018-11-05 – 2018-11-06 (×2): 5 mg via ORAL
  Filled 2018-11-05 (×2): qty 1

## 2018-11-05 MED ORDER — FLUTICASONE PROPIONATE 50 MCG/ACT NA SUSP
2.0000 | Freq: Every day | NASAL | Status: DC
  Administered 2018-11-05 – 2018-11-06 (×2): 2 via NASAL
  Filled 2018-11-05: qty 16

## 2018-11-05 MED ORDER — ROSUVASTATIN CALCIUM 10 MG PO TABS
10.0000 mg | ORAL_TABLET | Freq: Every day | ORAL | Status: DC
  Administered 2018-11-05: 10 mg via ORAL
  Filled 2018-11-05: qty 1

## 2018-11-05 NOTE — ED Notes (Signed)
ED TO INPATIENT HANDOFF REPORT  ED Nurse Name and Phone #: Mitch [phone]  S Name/Age/Gender [name] 58 y.o. male Room/Bed: ED06A/ED06A  Code Status   Code Status: Prior  Home/SNF/Other Home Patient oriented to: self, place, time and situation Is this baseline? Yes   Triage Complete: Triage complete  Chief Complaint Fever, SOB, Dizziness, Chest Pain ( Dull Pain)  Triage Note Pt presents to ED with c/o worsening headache, fever, sob, non-productive cough since yesterday. Pt reports mid chest pain this morning upon waking. +dizziness per his wife.    Allergies Allergies  Allergen Reactions  . Morphine Itching  . Ibuprofen Other (See Comments)    Due to kidney transplant    Level of Care/Admitting Diagnosis ED Disposition    ED Disposition Condition [facility]: [place] [[id number]]  Level of Care: Telemetry [5]  Covid Evaluation: Asymptomatic Screening Protocol (No Symptoms)  Diagnosis: CHF (congestive heart failure) [facility]) [[id number]]  Admitting Physician: Dustin Flock [[id number]]  Attending Physician: Dustin Flock [[id number]]  Estimated length of stay: past midnight tomorrow  Certification:: I certify this patient will need inpatient services for at least 2 midnights  PT Class (Do Not Modify): Inpatient [101]  PT Acc Code (Do Not Modify): Private [1]       B Medical/Surgery History Past Medical History:  Diagnosis Date  . Allergy    seasonal  . Amnestic MCI (mild cognitive impairment with memory loss)   . Asthma   . Benign neoplasm of colon   . CAD (coronary artery disease) 2005   a.  Status post four-vessel CABG in 2005  . Chronic atrial fibrillation    a. CHADS2VASc 4 (CHF, HTN, DM, vascular disease); b. Eliquis  . Chronic combined systolic and diastolic CHF (congestive heart failure) ([place])    a.  TTE 7/18: EF 45-50%, moderately dilated LA, RVSF normal, PASP normal  . Chronic venous stasis dermatitis of both  lower extremities   . Cytomegaloviral disease ([place]) 2017  . Depression   . Diabetes mellitus   . Diverticulosis   . Dyspnea   . Erectile dysfunction   . ESRD (end stage renal disease) ([place]) 2005   a. HD 2004-2012; b. S/p renal transplant in 2012  . GERD (gastroesophageal reflux disease)   . Gout   . Hyperlipidemia    low since renal failure  . Hypertension   . Kidney transplant status, cadaveric 2012  . Obesity   . Pneumonia   . Purpura ([place])   . Sleep apnea    bipap with oxygen 2 l  . Trigger finger   . Ulcer 05/2016   Left shin  . Wears glasses    Past Surgical History:  Procedure Laterality Date  . BARIATRIC SURGERY    . CARDIAC CATHETERIZATION     [facility]  . CATARACT EXTRACTION W/PHACO Right 10/29/2017   Procedure: CATARACT EXTRACTION PHACO AND INTRAOCULAR LENS PLACEMENT ([place])  RIGHT DIABETIC;  Surgeon: [name], MD;  Location: [place];  Service: Ophthalmology;  Laterality: Right;  Diabetic - insulin  . CATARACT EXTRACTION W/PHACO Left 11/18/2017   Procedure: CATARACT EXTRACTION PHACO AND INTRAOCULAR LENS PLACEMENT ([place]) LEFT IVA/TOPICAL;  Surgeon: [name], MD;  Location: [place];  Service: Ophthalmology;  Laterality: Left;  DIABETES - insulin sleep apnea  . COLONOSCOPY WITH PROPOFOL N/A 04/22/2013   Procedure: COLONOSCOPY WITH PROPOFOL;  Surgeon: [name], MD;  Location: [facility];  Service: Endoscopy;  Laterality: N/A;  .  CORONARY ARTERY BYPASS GRAFT  2005   X 4  . DG ANGIO AV SHUNT*L*     right and left upper arms  . FASCIOTOMY  03/03/2012   Procedure: FASCIOTOMY;  Surgeon: [name], MD;  Location: [place];  Service: Orthopedics;  Laterality: Right;  FASCIOTOMY RIGHT SMALL FINGER  . FASCIOTOMY Left 08/17/2013   Procedure: FASCIOTOMY LEFT RING;  Surgeon: [name], MD;  Location: [place];  Service: Orthopedics;  Laterality: Left;  . INCISION AND DRAINAGE ABSCESS Left  10/15/2015   Procedure: INCISION AND DRAINAGE ABSCESS;  Surgeon: [name], MD;  Location: [facility];  Service: General;  Laterality: Left;  . KIDNEY TRANSPLANT  09/13/2010   cadaver--at [facility]  . RIGHT HEART CATH N/A 11/15/2016   Procedure: RIGHT HEART CATH;  Surgeon: [name], MD;  Location: [place] [facility];  Service: Cardiovascular;  Laterality: N/A;  . TYMPANIC MEMBRANE REPAIR  1/12   left  . VASECTOMY       A IV Location/Drains/Wounds Patient Lines/Drains/Airways Status   Active Line/Drains/Airways    Name:   Placement date:   Placement time:   Site:   Days:   Peripheral IV 11/05/18 Right Forearm   11/05/18    0705    Forearm   less than 1   Peripheral IV 11/05/18 Right Hand   11/05/18    0706    Hand   less than 1   Fistula / Graft Left Upper arm Arteriovenous fistula   -    -    Upper arm      Fistula / Graft Left Upper arm Arteriovenous fistula   -    -    Upper arm      Incision (Closed) 10/15/15 Leg Left   10/15/15    1116     1117   Incision (Closed) 10/29/17 Eye Right   10/29/17    0746     372   Incision (Closed) 11/18/17 Eye Left   11/18/17    0742     352   Wound / Incision (Open or Dehisced) 10/12/15 Incision - Open Leg Left;Lower   10/12/15    2038    Leg   1120          Intake/Output Last 24 hours  Intake/Output Summary (Last 24 hours) at 11/05/2018 1122 Last data filed at 11/05/2018 1031 Gross per 24 hour  Intake 600 ml  Output -  Net 600 ml    Labs/Imaging Results for orders placed or performed during the hospital encounter of 11/05/18 (from the past 48 hour(s))  Lactic acid, plasma     Status: None   Collection Time: 11/05/18  6:48 AM  Result Value Ref Range   Lactic Acid, Venous 1.0 0.5 - 1.9 mmol/L    Comment: Performed at [facility], [address] [name], [place] [address]  CBC with Differential     Status: Abnormal   Collection Time: 11/05/18  6:48 AM  Result Value Ref Range   WBC 6.5 4.0 - 10.5 K/uL   RBC 4.94  4.22 - 5.81 MIL/uL   Hemoglobin 11.1 (L) 13.0 - 17.0 g/dL   HCT 36.6 (L) 39.0 - 52.0 %   MCV 74.1 (L) 80.0 - 100.0 fL   MCH 22.5 (L) 26.0 - 34.0 pg   MCHC 30.3 30.0 - 36.0 g/dL   RDW 16.8 (H) 11.5 - 15.5 %   Platelets 133 (L) 150 -  400 K/uL   nRBC 0.0 0.0 - 0.2 %   Neutrophils Relative % 72 %   Neutro Abs 4.7 1.7 - 7.7 K/uL   Lymphocytes Relative 19 %   Lymphs Abs 1.3 0.7 - 4.0 K/uL   Monocytes Relative 7 %   Monocytes Absolute 0.5 0.1 - 1.0 K/uL   Eosinophils Relative 0 %   Eosinophils Absolute 0.0 0.0 - 0.5 K/uL   Basophils Relative 1 %   Basophils Absolute 0.0 0.0 - 0.1 K/uL   Immature Granulocytes 1 %   Abs Immature Granulocytes 0.03 0.00 - 0.07 K/uL    Comment: Performed at [facility], [address] [place], [place] [address]  Comprehensive metabolic panel     Status: Abnormal   Collection Time: 11/05/18  6:48 AM  Result Value Ref Range   Sodium 136 135 - 145 mmol/L   Potassium 3.9 3.5 - 5.1 mmol/L   Chloride 95 (L) 98 - 111 mmol/L   CO2 29 22 - 32 mmol/L   Glucose, Bld 228 (H) 70 - 99 mg/dL   BUN 40 (H) 6 - 20 mg/dL   Creatinine, Ser 1.55 (H) 0.61 - 1.24 mg/dL   Calcium 9.7 8.9 - 10.3 mg/dL   Total Protein 7.7 6.5 - 8.1 g/dL   Albumin 3.8 3.5 - 5.0 g/dL   AST 19 15 - 41 U/L   ALT 12 0 - 44 U/L   Alkaline Phosphatase 83 38 - 126 U/L   Total Bilirubin 1.1 0.3 - 1.2 mg/dL   GFR calc non Af Amer 48 (L) >60 mL/min   GFR calc Af Amer 56 (L) >60 mL/min   Anion gap 12 5 - 15    Comment: Performed at [facility], [place], [place] [address]  Troponin I (High Sensitivity)     Status: Abnormal   Collection Time: 11/05/18  6:48 AM  Result Value Ref Range   Troponin I (High Sensitivity) 34 (H) <18 ng/L    Comment: (NOTE) Elevated high sensitivity troponin I (hsTnI) values and significant  changes across serial measurements may suggest ACS but many other  chronic and acute conditions are known to elevate hsTnI results.  Refer to the  "Links" section for chest pain algorithms and additional  guidance. Performed at [facility], [address] [place], [place] [address]   SARS Coronavirus 2 [facility] order, Performed in [facility] hospital lab) Nasopharyngeal Nasopharyngeal Swab     Status: None   Collection Time: 11/05/18  6:49 AM   Specimen: Nasopharyngeal Swab  Result Value Ref Range   SARS Coronavirus 2 NEGATIVE NEGATIVE    Comment: (NOTE) If result is NEGATIVE SARS-CoV-2 target nucleic acids are NOT DETECTED. The SARS-CoV-2 RNA is generally detectable in upper and lower  respiratory specimens during the acute phase of infection. The lowest  concentration of SARS-CoV-2 viral copies this assay can detect is 250  copies / mL. A negative result does not preclude SARS-CoV-2 infection  and should not be used as the sole basis for treatment or other  patient management decisions.  A negative result may occur with  improper specimen collection / handling, submission of specimen other  than nasopharyngeal swab, presence of viral mutation(s) within the  areas targeted by this assay, and inadequate number of viral copies  (<250 copies / mL). A negative result must be combined with clinical  observations, patient history, and epidemiological information. If result is POSITIVE SARS-CoV-2 target nucleic acids are DETECTED. The  SARS-CoV-2 RNA is generally detectable in upper and lower  respiratory specimens dur ing the acute phase of infection.  Positive  results are indicative of active infection with SARS-CoV-2.  Clinical  correlation with patient history and other diagnostic information is  necessary to determine patient infection status.  Positive results do  not rule out bacterial infection or co-infection with other viruses. If result is PRESUMPTIVE POSTIVE SARS-CoV-2 nucleic acids MAY BE PRESENT.   A presumptive positive result was obtained on the submitted specimen  and confirmed on repeat testing.   While 2019 novel coronavirus  (SARS-CoV-2) nucleic acids may be present in the submitted sample  additional confirmatory testing may be necessary for epidemiological  and / or clinical management purposes  to differentiate between  SARS-CoV-2 and other Sarbecovirus currently known to infect humans.  If clinically indicated additional testing with an alternate test  methodology [phone]) is advised. The SARS-CoV-2 RNA is generally  detectable in upper and lower respiratory sp ecimens during the acute  phase of infection. The expected result is Negative. Fact Sheet for Patients:  StrictlyIdeas.no Fact Sheet for Healthcare Providers: BankingDealers.co.za This test is not yet approved or cleared by the [place] FDA and has been authorized for detection and/or diagnosis of SARS-CoV-2 by FDA under an Emergency Use Authorization (EUA).  This EUA will remain in effect (meaning this test can be used) for the duration of the COVID-19 declaration under Section 564(b)(1) of the Act, 21 U.S.C. section 360bbb-3(b)(1), unless the authorization is terminated or revoked sooner. Performed at [facility], [address] [place], [place] [address]   Urinalysis, Complete w Microscopic     Status: Abnormal   Collection Time: 11/05/18  7:01 AM  Result Value Ref Range   Color, Urine YELLOW (A) YELLOW   APPearance CLEAR (A) CLEAR   Specific Gravity, Urine 1.017 1.005 - 1.030   pH 5.0 5.0 - 8.0   Glucose, UA >=500 (A) NEGATIVE mg/dL   Hgb urine dipstick SMALL (A) NEGATIVE   Bilirubin Urine NEGATIVE NEGATIVE   Ketones, ur NEGATIVE NEGATIVE mg/dL   Protein, ur 30 (A) NEGATIVE mg/dL   Nitrite NEGATIVE NEGATIVE   Leukocytes,Ua NEGATIVE NEGATIVE   RBC / HPF 0-5 0 - 5 RBC/hpf   WBC, UA 0-5 0 - 5 WBC/hpf   Bacteria, UA NONE SEEN NONE SEEN   Squamous Epithelial / LPF NONE SEEN 0 - 5    Comment: Performed at [facility], [address] [place], [place] [address]  Lactic acid, plasma     Status: None   Collection Time: 11/05/18  9:01 AM  Result Value Ref Range   Lactic Acid, Venous 1.2 0.5 - 1.9 mmol/L    Comment: Performed at [facility], [place], [place] [address]  Troponin I (High Sensitivity)     Status: Abnormal   Collection Time: 11/05/18  9:01 AM  Result Value Ref Range   Troponin I (High Sensitivity) 35 (H) <18 ng/L    Comment: (NOTE) Elevated high sensitivity troponin I (hsTnI) values and significant  changes across serial measurements may suggest ACS but many other  chronic and acute conditions are known to elevate hsTnI results.  Refer to the "Links" section for chest pain algorithms and additional  guidance. Performed at [facility], [place], [place] [address]    Ct Head Wo Contrast  Result Date: 11/05/2018 CLINICAL DATA:  Headache EXAM: CT HEAD WITHOUT CONTRAST TECHNIQUE: Contiguous axial images were  obtained from the base of the skull through the vertex without intravenous contrast. COMPARISON:  Report for head CT 08/16/2002 (images unavailable). FINDINGS: Brain: There is no acute intracranial hemorrhage or demarcated territorial infarction.No evidence of intracranial mass.No midline shift or extra-axial collection. Minimal scattered ill-defined hypoattenuation within the cerebral white matter is nonspecific, but consistent with chronic small vessel ischemic disease. Cerebral volume is age appropriate. Vascular: No hyperdense vessel. Atherosclerotic calcification of the carotid artery siphons and vertebrobasilar system. Skull: Nonacute appearing defect within the left lateral pterygoid plate. Otherwise unremarkable. Sinuses/Orbits: The imaged globes and orbits demonstrate no acute abnormality. Mild partial opacification of left ethmoid air cells. No significant mastoid effusion. IMPRESSION: No evidence of acute intracranial abnormality. Minimal ill-defined  hypoattenuation within the cerebral white matter is nonspecific, but consistent with chronic small vessel ischemic disease. Mild left ethmoid paranasal sinus disease as described. Electronically Signed   By: Kellie Simmering   On: 11/05/2018 09:45   Dg Chest Port 1 View  Result Date: 11/05/2018 CLINICAL DATA:  Cough and fever EXAM: PORTABLE CHEST 1 VIEW COMPARISON:  Aug 13, 2017 FINDINGS: There is no edema or consolidation. There is cardiomegaly with pulmonary venous hypertension. Patient is status post coronary artery bypass grafting. No adenopathy. A calcified azygos lymph node is a stable finding. No bone lesions. IMPRESSION: Cardiomegaly with pulmonary vascular congestion. Status post coronary artery bypass grafting. No edema consolidation. Calcification in the azygos region is felt to be indicative of calcified azygos lymph node. Electronically Signed   By: Lowella Grip III M.D.   On: 11/05/2018 06:58    Pending Labs Unresulted Labs (From admission, onward)    Start     Ordered   11/05/18 1028  Procalcitonin - Baseline  ONCE - STAT,   STAT     11/05/18 1027   11/05/18 0627  Urine culture  Once,   STAT    Question:  Patient immune status  Answer:  Immunocompromised   11/05/18 4196   11/05/18 0626  Culture, blood (routine x 2)  BLOOD CULTURE X 2,   STAT    Question:  Patient immune status  Answer:  Immunocompromised   11/05/18 0627   Signed and Held  HIV antibody (Routine Testing)  Once,   R     Signed and Held   Signed and Held  Basic metabolic panel  Daily,   R     Signed and Held          Vitals/Pain Today's Vitals   11/05/18 0930 11/05/18 0931 11/05/18 0945 11/05/18 1000  BP: (!) 134/57   139/63  Pulse:   65 61  Resp:      Temp:      TempSrc:      SpO2:   99% 100%  Weight:      Height:      PainSc:  2       Isolation Precautions No active isolations  Medications Medications  vancomycin (VANCOCIN) IVPB 1000 mg/200 mL premix (1,000 mg Intravenous New Bag/Given  11/05/18 1036)  azithromycin (ZITHROMAX) 500 mg in sodium chloride 0.9 % 250 mL IVPB (has no administration in time range)  vancomycin (VANCOCIN) 1,500 mg in sodium chloride 0.9 % 500 mL IVPB (has no administration in time range)  cefTRIAXone (ROCEPHIN) 1 g in sodium chloride 0.9 % 100 mL IVPB (has no administration in time range)  azithromycin (ZITHROMAX) 500 mg in sodium chloride 0.9 % 250 mL IVPB (has no administration in time range)  sodium chloride 0.9 %  bolus 500 mL (0 mLs Intravenous Stopped 11/05/18 1030)  fentaNYL (SUBLIMAZE) injection 50 mcg (50 mcg Intravenous Given 11/05/18 0855)  ceFEPIme (MAXIPIME) 2 g in sodium chloride 0.9 % 100 mL IVPB (0 g Intravenous Stopped 11/05/18 1031)    Mobility walks Low fall risk   Focused Assessments Cardiac Assessment Handoff:    Lab Results  Component Value Date   CKTOTAL 79 11/11/2016   CKMB 2.2 05/10/2012   TROPONINI <0.03 11/14/2016   No results found for: DDIMER Does the Patient currently have chest pain? Yes     R Recommendations: See Admitting Provider Note  Report given to:

## 2018-11-05 NOTE — H&P (Signed)
[facility] NAME: [name]    MR#:  [id number]  DATE OF BIRTH:  1960-10-03  DATE OF ADMISSION:  11/05/2018  PRIMARY CARE PHYSICIAN: [name], MD   REQUESTING/REFERRING PHYSICIAN: Vanessa Ivanhoe, MD  CHIEF COMPLAINT:   Chief Complaint  Patient presents with  . Chest Pain  . Fever  . Shortness of Breath    HISTORY OF PRESENT ILLNESS: [name]  is a 58 y.o. male with a known history of multiple medical problems including asthma, coronary artery disease, chronic atrial fibrillation, chronic combined systolic and diastolic CHF, depression, diabetes type 2, status post renal transplant who is on chronic immunosuppression therapy who is presenting to the hospital with complaint of fever, chest pain and shortness of breath.  Patient states that he was doing fine up till yesterday when he started developing the symptoms.  He describes the pain in the chest is a centrally located sharp.  No radiation of the pain.  He did not have any nausea vomiting patient states that he also had low-grade fevers.  In the emergency room patient was evaluated and because of his low-grade fevers he is admitted to the hospital he is been treated for possible pneumonia however his chest x-ray is negative his urinalysis is negative.  His COVID testing is negative.      PAST MEDICAL HISTORY:   Past Medical History:  Diagnosis Date  . Allergy    seasonal  . Amnestic MCI (mild cognitive impairment with memory loss)   . Asthma   . Benign neoplasm of colon   . CAD (coronary artery disease) 2005   a.  Status post four-vessel CABG in 2005  . Chronic atrial fibrillation    a. CHADS2VASc 4 (CHF, HTN, DM, vascular disease); b. Eliquis  . Chronic combined systolic and diastolic CHF (congestive heart failure) ([place])    a.  TTE 7/18: EF 45-50%, moderately dilated LA, RVSF normal, PASP normal  . Chronic venous stasis dermatitis of both lower extremities   .  Cytomegaloviral disease ([place]) 2017  . Depression   . Diabetes mellitus   . Diverticulosis   . Dyspnea   . Erectile dysfunction   . ESRD (end stage renal disease) ([place]) 2005   a. HD 2004-2012; b. S/p renal transplant in 2012  . GERD (gastroesophageal reflux disease)   . Gout   . Hyperlipidemia    low since renal failure  . Hypertension   . Kidney transplant status, cadaveric 2012  . Obesity   . Pneumonia   . Purpura ([place])   . Sleep apnea    bipap with oxygen 2 l  . Trigger finger   . Ulcer 05/2016   Left shin  . Wears glasses     PAST SURGICAL HISTORY:  Past Surgical History:  Procedure Laterality Date  . BARIATRIC SURGERY    . CARDIAC CATHETERIZATION     [facility]  . CATARACT EXTRACTION W/PHACO Right 10/29/2017   Procedure: CATARACT EXTRACTION PHACO AND INTRAOCULAR LENS PLACEMENT ([place])  RIGHT DIABETIC;  Surgeon: [name], MD;  Location: [facility];  Service: Ophthalmology;  Laterality: Right;  Diabetic - insulin  . CATARACT EXTRACTION W/PHACO Left 11/18/2017   Procedure: CATARACT EXTRACTION PHACO AND INTRAOCULAR LENS PLACEMENT ([place]) LEFT IVA/TOPICAL;  Surgeon: [name], MD;  Location: [facility];  Service: Ophthalmology;  Laterality: Left;  DIABETES - insulin sleep apnea  . COLONOSCOPY WITH PROPOFOL N/A 04/22/2013   Procedure: COLONOSCOPY WITH  PROPOFOL;  Surgeon: [name], MD;  Location: [facility];  Service: Endoscopy;  Laterality: N/A;  . CORONARY ARTERY BYPASS GRAFT  2005   X 4  . DG ANGIO AV SHUNT*L*     right and left upper arms  . FASCIOTOMY  03/03/2012   Procedure: FASCIOTOMY;  Surgeon: [name], MD;  Location: [facility];  Service: Orthopedics;  Laterality: Right;  FASCIOTOMY RIGHT SMALL FINGER  . FASCIOTOMY Left 08/17/2013   Procedure: FASCIOTOMY LEFT RING;  Surgeon: [name], MD;  Location: [facility];  Service: Orthopedics;  Laterality: Left;  . INCISION AND DRAINAGE ABSCESS Left  10/15/2015   Procedure: INCISION AND DRAINAGE ABSCESS;  Surgeon: [name], MD;  Location: [facility];  Service: General;  Laterality: Left;  . KIDNEY TRANSPLANT  09/13/2010   cadaver--at [facility]  . RIGHT HEART CATH N/A 11/15/2016   Procedure: RIGHT HEART CATH;  Surgeon: [name], MD;  Location: [facility];  Service: Cardiovascular;  Laterality: N/A;  . TYMPANIC MEMBRANE REPAIR  1/12   left  . VASECTOMY      SOCIAL HISTORY:  Social History   Tobacco Use  . Smoking status: Former Smoker    Types: Cigars    Quit date: 09/02/1994    Years since quitting: 24.1  . Smokeless tobacco: Never Used  Substance Use Topics  . Alcohol use: Yes    Alcohol/week: 0.0 - 1.0 standard drinks    Comment: occasional- 3 in the past year    FAMILY HISTORY:  Family History  Problem Relation Age of Onset  . Heart disease Father   . Kidney failure Father   . Kidney disease Father   . Diabetes Maternal Grandmother   . Breast cancer Maternal Grandmother   . Valvular heart disease Mother   . Liver cancer Paternal Uncle   . Liver cancer Paternal Grandmother   . Prostate cancer Neg Hx     DRUG ALLERGIES:  Allergies  Allergen Reactions  . Morphine Itching  . Ibuprofen Other (See Comments)    Due to kidney transplant    REVIEW OF SYSTEMS:   CONSTITUTIONAL: Positive fever, fatigue or weakness.  EYES: No blurred or double vision.  EARS, NOSE, AND THROAT: No tinnitus or ear pain.  RESPIRATORY: No cough, positive shortness of breath, wheezing or hemoptysis.  CARDIOVASCULAR: Positive chest pain, orthopnea, edema.  GASTROINTESTINAL: No nausea, vomiting, diarrhea or abdominal pain.  GENITOURINARY: No dysuria, hematuria.  ENDOCRINE: No polyuria, nocturia,  HEMATOLOGY: No anemia, easy bruising or bleeding SKIN: No rash or lesion. MUSCULOSKELETAL: No joint pain or arthritis.   NEUROLOGIC: No tingling, numbness, weakness.  PSYCHIATRY: No anxiety or depression.   MEDICATIONS AT  HOME:  Prior to Admission medications   Medication Sig Start Date End Date Taking? Authorizing Provider  amLODipine (NORVASC) 5 MG tablet Take 1 tablet (5 mg total) by mouth daily. 12/22/17  Yes [name], Otila Kluver A, FNP  beclomethasone (QVAR REDIHALER) 80 MCG/ACT inhaler Inhale 2 puffs into the lungs 2 (two) times daily. Rinse mouth after use. 06/15/18 06/15/19 Yes Laverle Hobby, MD  buPROPion (WELLBUTRIN SR) 150 MG 12 hr tablet TAKE 1 TABLET BY MOUTH TWICE DAILY 09/03/18  Yes [name] I, MD  colchicine 0.6 MG tablet Take 1 tablet (0.6 mg total) by mouth 2 (two) times daily as needed. 03/30/18  Yes [name], MD  ELIQUIS 5 MG TABS tablet TAKE 1 TABLET (5 MG TOTAL) BY MOUTH 2 (TWO) TIMES  DAILY. 06/09/18  Yes Arida, Mertie Clause, MD  FARXIGA 10 MG TABS tablet Take 1 tablet by mouth daily. 10/14/18  Yes [provider]  fluticasone (FLONASE) 50 MCG/ACT nasal spray Place 2 sprays into both nostrils daily. 08/26/18  Yes Elby [name], FNP  furosemide (LASIX) 40 MG tablet Take 1.5 tablets (60 mg total) by mouth every morning AND 1 tablet (40 mg total) every evening. Patient taking differently: Take 2 tablets (39m total) by mouth every morning AND 1 tablet (40 mg total) every evening. 07/03/18  Yes MLarey Dresser MD  HUMALOG KWIKPEN 100 UNIT/ML KwikPen Inject 5-10 Units into the skin 3 (three) times daily.  09/01/18  Yes [provider]  insulin glargine (LANTUS) 100 unit/mL SOPN Inject 0.1 mLs (10 Units total) into the skin at bedtime. Currently using 15 units Patient taking differently: Inject 20 Units into the skin at bedtime.  11/16/16  Yes [name] MD  lisinopril (ZESTRIL) 40 MG tablet TAKE 1 TABLET BY MOUTH ONCE DAILY 08/19/18  Yes HDarylene PriceA, FNP  magnesium 30 MG tablet Take 30 mg by mouth daily.   Yes [provider]  Multiple Vitamin (MULTIVITAMIN WITH MINERALS) TABS tablet Take 1 tablet by mouth daily.   Yes [provider]  mycophenolate  (MYFORTIC) 180 MG EC tablet Take 360 mg by mouth 2 (two) times daily.    Yes [provider]  omeprazole (PRILOSEC) 20 MG capsule TAKE 1 CAPSULE BY MOUTH ONCE DAILY 02/21/16  Yes LViviana SimplerI, MD  potassium chloride (MICRO-K) 10 MEQ CR capsule TAKE 2 CAPSULES (20 MEQ TOTAL) BY MOUTH 2 (TWO) TIMES DAILY. 05/18/18  Yes HDarylene PriceA, FNP  rosuvastatin (CRESTOR) 10 MG tablet TAKE 1 TABLET BY MOUTH DAILY 06/09/18  Yes AWellington Hampshire MD  spironolactone (ALDACTONE) 25 MG tablet Take 1 tablet (25 mg total) by mouth daily. 11/16/16 11/05/18 Yes [name] MD  tacrolimus (PROGRAF) 0.5 MG capsule Take 1 mg by mouth 2 (two) times daily.   Yes [provider]  tamsulosin (FLOMAX) 0.4 MG CAPS capsule Take 0.4 mg by mouth daily.    Yes [provider]  albuterol (PROVENTIL HFA;VENTOLIN HFA) 108 (90 Base) MCG/ACT inhaler Inhale 2 puffs every 6 (six) hours as needed into the lungs for wheezing or shortness of breath. 02/10/17   LVenia Carbon MD  metolazone (ZAROXOLYN) 5 MG tablet TAKE 1 TABLET BY MOUTH AS NEEDED 12/03/17   HDarylene PriceA, FNP  montelukast (SINGULAIR) 10 MG tablet TAKE 1 TABLET BY MOUTH AT BEDTIME 06/01/18   RLaverle Hobby MD  sildenafil (VIAGRA) 100 MG tablet TAKE 0.5-1 TABLETS (50-100 MG TOTAL) BY MOUTH DAILY AS NEEDED FOR ERECTILE DYSFUNCTION. 01/28/18   LVenia Carbon MD      PHYSICAL EXAMINATION:   VITAL SIGNS: Blood pressure (!) 164/75, pulse 75, temperature 99.4 F (37.4 C), temperature source Oral, resp. rate 20, height _0  (1.778 m), weight 118.4 kg, SpO2 96 %.  GENERAL:  58y.o.-year-old patient lying in the bed with no acute distress.  EYES: Pupils equal, round, reactive to light and accommodation. No scleral icterus. Extraocular muscles intact.  HEENT: Head atraumatic, normocephalic. Oropharynx and nasopharynx clear.  NECK:  Supple, no jugular venous distention. No thyroid enlargement, no tenderness.  LUNGS: Decreased breath  sites bilaterally CARDIOVASCULAR: S1, S2 normal. No murmurs, rubs, or gallops.  ABDOMEN: Soft, nontender, nondistended. Bowel sounds present. No organomegaly or mass.  EXTREMITIES: Trace pedal edema, cyanosis, or clubbing.  NEUROLOGIC:  Cranial nerves II through XII are intact. Muscle strength 5/5 in all extremities. Sensation intact. Gait not checked.  PSYCHIATRIC: The patient is alert and oriented x 3.  SKIN: No obvious rash, lesion, or ulcer.   LABORATORY PANEL:   CBC Recent Labs  Lab 11/05/18 0648  WBC 6.5  HGB 11.1*  HCT 36.6*  PLT 133*  MCV 74.1*  MCH 22.5*  MCHC 30.3  RDW 16.8*  LYMPHSABS 1.3  MONOABS 0.5  EOSABS 0.0  BASOSABS 0.0   ------------------------------------------------------------------------------------------------------------------  Chemistries  Recent Labs  Lab 11/05/18 0648  NA 136  K 3.9  CL 95*  CO2 29  GLUCOSE 228*  BUN 40*  CREATININE 1.55*  CALCIUM 9.7  AST 19  ALT 12  ALKPHOS 83  BILITOT 1.1   ------------------------------------------------------------------------------------------------------------------ estimated creatinine clearance is 66.2 mL/min (A) (by C-G formula based on SCr of 1.55 mg/dL (H)). ------------------------------------------------------------------------------------------------------------------ No results for input(s): TSH, T4TOTAL, T3FREE, THYROIDAB in the last 72 hours.  Invalid input(s): FREET3   Coagulation profile No results for input(s): INR, PROTIME in the last 168 hours. ------------------------------------------------------------------------------------------------------------------- No results for input(s): DDIMER in the last 72 hours. -------------------------------------------------------------------------------------------------------------------  Cardiac Enzymes No results for input(s): CKMB, TROPONINI, MYOGLOBIN in the last 168 hours.  Invalid input(s):  CK ------------------------------------------------------------------------------------------------------------------ Invalid input(s): POCBNP  ---------------------------------------------------------------------------------------------------------------  Urinalysis    Component Value Date/Time   COLORURINE YELLOW (A) 11/05/2018 0701   APPEARANCEUR CLEAR (A) 11/05/2018 0701   APPEARANCEUR Clear 09/23/2014 0855   LABSPEC 1.017 11/05/2018 0701   PHURINE 5.0 11/05/2018 0701   GLUCOSEU >=500 (A) 11/05/2018 0701   HGBUR SMALL (A) 11/05/2018 0701   BILIRUBINUR NEGATIVE 11/05/2018 0701   BILIRUBINUR Negative 09/23/2014 0855   KETONESUR NEGATIVE 11/05/2018 0701   PROTEINUR 30 (A) 11/05/2018 0701   NITRITE NEGATIVE 11/05/2018 0701   LEUKOCYTESUR NEGATIVE 11/05/2018 0701     RADIOLOGY: Ct Head Wo Contrast  Result Date: 11/05/2018 CLINICAL DATA:  Headache EXAM: CT HEAD WITHOUT CONTRAST TECHNIQUE: Contiguous axial images were obtained from the base of the skull through the vertex without intravenous contrast. COMPARISON:  Report for head CT 08/16/2002 (images unavailable). FINDINGS: Brain: There is no acute intracranial hemorrhage or demarcated territorial infarction.No evidence of intracranial mass.No midline shift or extra-axial collection. Minimal scattered ill-defined hypoattenuation within the cerebral white matter is nonspecific, but consistent with chronic small vessel ischemic disease. Cerebral volume is age appropriate. Vascular: No hyperdense vessel. Atherosclerotic calcification of the carotid artery siphons and vertebrobasilar system. Skull: Nonacute appearing defect within the left lateral pterygoid plate. Otherwise unremarkable. Sinuses/Orbits: The imaged globes and orbits demonstrate no acute abnormality. Mild partial opacification of left ethmoid air cells. No significant mastoid effusion. IMPRESSION: No evidence of acute intracranial abnormality. Minimal ill-defined hypoattenuation  within the cerebral white matter is nonspecific, but consistent with chronic small vessel ischemic disease. Mild left ethmoid paranasal sinus disease as described. Electronically Signed   By: Kellie Simmering   On: 11/05/2018 09:45   Ct Chest Wo Contrast  Result Date: 11/05/2018 CLINICAL DATA:  Pneumonia. Shortness of breath. Cough and fever. EXAM: CT CHEST WITHOUT CONTRAST TECHNIQUE: Multidetector CT imaging of the chest was performed following the standard protocol without IV contrast. COMPARISON:  Chest radiograph November 05, 2018 FINDINGS: Cardiovascular: Enlarged heart. Calcific atherosclerotic disease of the coronary arteries. No pericardial effusion. Post CABG. Mediastinum/Nodes: Calcified right paratracheal lymph nodal mass measures 3 cm. Smaller calcified right hilar and peribronchial lymph nodes. Lungs/Pleura: Punctate calcifications throughout both lungs likely represent granulomatous disease. Mild atelectasis of the  lingula. Upper Abdomen: Vascular calcifications of the splenic artery. Post partial gastrectomy. Musculoskeletal: Stigmata of diffuse idiopathic skeletal hyperostosis of the thoracic spine. IMPRESSION: 1. Enlarged heart. 2. Calcific atherosclerotic disease of the coronary arteries and aorta. 3. Sequelae of prior granulomatous disease. No evidence of acute airspace consolidation. 4. Mild atelectasis of the lingula. Aortic Atherosclerosis (ICD10-I70.0). Electronically Signed   By: Fidela Salisbury M.D.   On: 11/05/2018 11:33   Dg Chest Port 1 View  Result Date: 11/05/2018 CLINICAL DATA:  Cough and fever EXAM: PORTABLE CHEST 1 VIEW COMPARISON:  Aug 13, 2017 FINDINGS: There is no edema or consolidation. There is cardiomegaly with pulmonary venous hypertension. Patient is status post coronary artery bypass grafting. No adenopathy. A calcified azygos lymph node is a stable finding. No bone lesions. IMPRESSION: Cardiomegaly with pulmonary vascular congestion. Status post coronary artery bypass  grafting. No edema consolidation. Calcification in the azygos region is felt to be indicative of calcified azygos lymph node. Electronically Signed   By: Lowella Grip III M.D.   On: 11/05/2018 06:58    EKG: Orders placed or performed during the hospital encounter of 11/05/18  . EKG 12-Lead  . EKG 12-Lead  . EKG 12-Lead  . EKG 12-Lead    IMPRESSION AND PLAN: Patient is 58 year old male presenting with fever chest pressure and shortness of breath  1.  Fever etiology unclear patient's chest x-ray shows showed possible congestive heart failure therefore I ordered a CT scan of the chest which is negative for any pneumonia.  For now we will continue antibiotics follow his temperature  2.  Acute on chronic diastolic/systolic CHF we will treat with low-dose IV Lasix   3. Chest pressure appears to be atypical in nature troponin slightly abnormal will recheck troponin  4.  Diabetes type 2 we will place on sliding scale insulin continue his home regimen  5  Essential hypertension continue lisinopril   6.  History of renal transplant continue his immunosuppressant medication  7. Misc: lovenox for dvt proph   All the records are reviewed and case discussed with ED provider. Management plans discussed with the patient, family and they are in agreement.  CODE STATUS:    Code Status Orders  (From admission, onward)         Start     Ordered   11/05/18 1225  Full code  Continuous     11/05/18 1224        Code Status History    Date Active Date Inactive Code Status Order ID Comments User Context   11/11/2016 1637 11/16/2016 1833 Full Code [id number]  Loletha Grayer, MD Inpatient   10/12/2015 2005 10/18/2015 1646 Full Code [id number]  Gladstone Lighter, MD Inpatient   08/30/2015 2122 09/04/2015 2217 Full Code [id number]  Fritzi Mandes, MD Inpatient   10/13/2014 0735 10/14/2014 1647 Full Code [id number]  Juluis Mire, MD Inpatient   Advance Care Planning Activity    Advance  Directive Documentation     Most Recent Value  Type of Advance Directive  Healthcare Power of Attorney, Living will  Pre-existing out of facility DNR order (yellow form or pink MOST form)  -  "MOST" Form in Place?  -       TOTAL TIME TAKING CARE OF THIS PATIENT: 68mnutes.    SDustin FlockM.D on 11/05/2018 at 1:48 PM  Between 7am to 6pm - Pager - [phone]  After 6pm go to [email or web address] - [email or web address] [facility]  [phone] CC: Primary  care physician; [name], MD

## 2018-11-05 NOTE — ED Notes (Signed)
Patient transported to CT 

## 2018-11-05 NOTE — ED Triage Notes (Addendum)
Pt presents to ED with c/o worsening headache, fever, sob, non-productive cough since yesterday. Pt reports mid chest pain this morning upon waking. +dizziness per his wife.

## 2018-11-05 NOTE — ED Provider Notes (Addendum)
[facility] Emergency Department Provider Note   ____________________________________________   First MD Initiated Contact with Patient 11/05/18 0606     (approximate)  I have reviewed the triage vital signs and the nursing notes.   HISTORY  Chief Complaint Chest Pain, Fever, and Shortness of Breath    HPI [name] is a 58 y.o. male who presents to the ED from home with a chief complaint of fever, generalized weakness, shortness of breath and chest pain.  Patient has a history of renal transplant on immunosuppressant, CAD status post CABG, atrial fibrillation on Eliquis, diabetes, hyperlipidemia.  Complains of frontal headache, fever to 102 F, nonproductive cough and shortness of breath since yesterday.  Has oxygen at home which she does not usually wear but had to wear it last night.  Awoke this morning with midsternal chest pain and dizziness.  Denies abdominal pain, nausea or vomiting.  Denies recent travel, trauma or exposure to persons diagnosed with coronavirus.      Past Medical History:  Diagnosis Date   Allergy    seasonal   Amnestic MCI (mild cognitive impairment with memory loss)    Asthma    Benign neoplasm of colon    CAD (coronary artery disease) 2005   a.  Status post four-vessel CABG in 2005   Chronic atrial fibrillation    a. CHADS2VASc 4 (CHF, HTN, DM, vascular disease); b. Eliquis   Chronic combined systolic and diastolic CHF (congestive heart failure) ([place])    a.  TTE 7/18: EF 45-50%, moderately dilated LA, RVSF normal, PASP normal   Chronic venous stasis dermatitis of both lower extremities    Cytomegaloviral disease ([place]) 2017   Depression    Diabetes mellitus    Diverticulosis    Dyspnea    Erectile dysfunction    ESRD (end stage renal disease) ([place]) 2005   a. HD 2004-2012; b. S/p renal transplant in 2012   GERD (gastroesophageal reflux disease)    Gout    Hyperlipidemia    low since renal  failure   Hypertension    Kidney transplant status, cadaveric 2012   Obesity    Pneumonia    Purpura ([place])    Sleep apnea    bipap with oxygen 2 l   Trigger finger    Ulcer 05/2016   Left shin   Wears glasses     Patient Active Problem List   Diagnosis Date Noted   MDD (major depressive disorder), single episode, mild ([place]) 04/29/2018   Acute gout 03/30/2018   Cervical sprain, initial encounter 03/02/2018   Moderate persistent asthma 05/21/2017   Cough 02/10/2017   Chronic diastolic heart failure ([place]) 11/19/2016   Chronic venous insufficiency 06/05/2016   Chronic ulcer of left leg ([place]) 10/26/2015   Cytomegalovirus (CMV) viremia ([place]) 09/07/2015   Proteinuria 06/23/2015   Hypokalemia 10/13/2014   BPH with obstruction/lower urinary tract symptoms 09/25/2014   Hypogonadism in male 09/25/2014   Immunosuppressed status ([place]) 09/09/2014   Chronic atrial fibrillation 07/18/2014   DM eye manif type II 10/27/2013   MDD (recurrent major depressive disorder) in remission ([place]) 10/04/2013   Benign neoplasm of colon 04/22/2013   Diverticulosis of colon (without mention of hemorrhage) 04/22/2013   History of renal transplant 03/01/2013   Asthma, persistent controlled 02/25/2013   Erectile dysfunction 10/20/2012   Obstructive sleep apnea 09/29/2012   Obesity hypoventilation syndrome ([place]) 03/31/2012   Routine general medical examination at a health care facility 11/12/2011   Type 2  diabetes, controlled, with renal manifestation ([place]) 03/01/2011   Essential (primary) hypertension 12/17/2010   CAD, multiple vessel 12/17/2010   Hyperlipidemia    ESRD (end stage renal disease) [facility])     Past Surgical History:  Procedure Laterality Date   BARIATRIC SURGERY     CARDIAC CATHETERIZATION     [facility]   CATARACT EXTRACTION W/PHACO Right 10/29/2017   Procedure: CATARACT EXTRACTION PHACO AND INTRAOCULAR LENS PLACEMENT ([place])  RIGHT DIABETIC;  Surgeon:  [name], MD;  Location: [place];  Service: Ophthalmology;  Laterality: Right;  Diabetic - insulin   CATARACT EXTRACTION W/PHACO Left 11/18/2017   Procedure: CATARACT EXTRACTION PHACO AND INTRAOCULAR LENS PLACEMENT ([place]) LEFT IVA/TOPICAL;  Surgeon: [name], MD;  Location: [place];  Service: Ophthalmology;  Laterality: Left;  DIABETES - insulin sleep apnea   COLONOSCOPY WITH PROPOFOL N/A 04/22/2013   Procedure: COLONOSCOPY WITH PROPOFOL;  Surgeon: [name], MD;  Location: [facility];  Service: Endoscopy;  Laterality: N/A;   CORONARY ARTERY BYPASS GRAFT  2005   X 4   DG ANGIO AV SHUNT*L*     right and left upper arms   FASCIOTOMY  03/03/2012   Procedure: FASCIOTOMY;  Surgeon: [name], MD;  Location: [place];  Service: Orthopedics;  Laterality: Right;  FASCIOTOMY RIGHT SMALL FINGER   FASCIOTOMY Left 08/17/2013   Procedure: FASCIOTOMY LEFT RING;  Surgeon: [name], MD;  Location: [place];  Service: Orthopedics;  Laterality: Left;   INCISION AND DRAINAGE ABSCESS Left 10/15/2015   Procedure: INCISION AND DRAINAGE ABSCESS;  Surgeon: [name], MD;  Location: [facility]S;  Service: General;  Laterality: Left;   KIDNEY TRANSPLANT  09/13/2010   cadaver--at [facility] HEART CATH N/A 11/15/2016   Procedure: RIGHT HEART CATH;  Surgeon: [name], MD;  Location: [place] CV LAB;  Service: Cardiovascular;  Laterality: N/A;   TYMPANIC MEMBRANE REPAIR  1/12   left   VASECTOMY      Prior to Admission medications   Medication Sig Start Date End Date Taking? Authorizing Provider  albuterol (PROVENTIL HFA;VENTOLIN HFA) 108 (90 Base) MCG/ACT inhaler Inhale 2 puffs every 6 (six) hours as needed into the lungs for wheezing or shortness of breath. 02/10/17   [name], MD  AMBULATORY NON FORMULARY MEDICATION Medication Name: Nebulizer 08/13/17   Laverle Hobby, MD    amLODipine (NORVASC) 5 MG tablet Take 1 tablet (5 mg total) by mouth daily. 12/22/17   Alisa Graff, FNP  beclomethasone (QVAR REDIHALER) 80 MCG/ACT inhaler Inhale 2 puffs into the lungs 2 (two) times daily. Rinse mouth after use. 06/15/18 06/15/19  Laverle Hobby, MD  buPROPion [facility] SR) 150 MG 12 hr tablet TAKE 1 TABLET BY MOUTH TWICE DAILY 09/03/18   [name] I, MD  colchicine 0.6 MG tablet Take 1 tablet (0.6 mg total) by mouth 2 (two) times daily as needed. 03/30/18   [name], MD  ELIQUIS 5 MG TABS tablet TAKE 1 TABLET (5 MG TOTAL) BY MOUTH 2 (TWO) TIMES DAILY. 06/09/18   [name], MD  fluticasone (FLONASE) 50 MCG/ACT nasal spray Place 2 sprays into both nostrils daily. 08/26/18   [place] [name], FNP  furosemide (LASIX) 40 MG tablet Take 1.5 tablets (60 mg total) by mouth every morning AND 1 tablet (40 mg total) every evening. Patient taking differently: Take 2 tablets (60m total) by mouth every morning AND 1 tablet (40 mg total)  every evening. 07/03/18   [name], MD  insulin aspart (NOVOLOG) 100 UNIT/ML injection Inject 1-5 Units into the skin once. Using as sliding scale. Takes 1 unit if is 10 above normal, 5 units if is 50 above normal. Upper limit of normal for him is 120.    [provider]  insulin glargine (LANTUS) 100 unit/mL SOPN Inject 0.1 mLs (10 Units total) into the skin at bedtime. Currently using 15 units 11/16/16   Fritzi Mandes, MD  lisinopril (ZESTRIL) 40 MG tablet TAKE 1 TABLET BY MOUTH ONCE DAILY 08/19/18   Darylene Price A, FNP  magnesium 30 MG tablet Take 30 mg by mouth daily.    [provider]  metolazone (ZAROXOLYN) 5 MG tablet TAKE 1 TABLET BY MOUTH AS NEEDED 12/03/17   Darylene Price A, FNP  montelukast (SINGULAIR) 10 MG tablet TAKE 1 TABLET BY MOUTH AT BEDTIME 06/01/18   Laverle Hobby, MD  Multiple Vitamin (MULTIVITAMIN WITH MINERALS) TABS tablet Take 1 tablet by mouth daily.    [provider]   mycophenolate (MYFORTIC) 180 MG EC tablet Take 360 mg by mouth 2 (two) times daily.     [provider]  omeprazole (PRILOSEC) 20 MG capsule TAKE 1 CAPSULE BY MOUTH ONCE DAILY 02/21/16   [name] I, MD  potassium chloride (MICRO-K) 10 MEQ CR capsule TAKE 2 CAPSULES (20 MEQ TOTAL) BY MOUTH 2 (TWO) TIMES DAILY. 05/18/18   Alisa Graff, FNP  rosuvastatin (CRESTOR) 10 MG tablet TAKE 1 TABLET BY MOUTH DAILY 06/09/18   [name], MD  sildenafil (VIAGRA) 100 MG tablet TAKE 0.5-1 TABLETS (50-100 MG TOTAL) BY MOUTH DAILY AS NEEDED FOR ERECTILE DYSFUNCTION. 01/28/18   [name], MD  spironolactone (ALDACTONE) 25 MG tablet Take 1 tablet (25 mg total) by mouth daily. 11/16/16 08/24/18  Fritzi Mandes, MD  tacrolimus (PROGRAF) 0.5 MG capsule Take 1 mg by mouth 2 (two) times daily.    [provider]  tamsulosin (FLOMAX) 0.4 MG CAPS capsule Take 0.4 mg by mouth daily.     [provider]    Allergies Morphine  Family History  Problem Relation Age of Onset   Heart disease Father    Kidney failure Father    Kidney disease Father    Diabetes Maternal Grandmother    Breast cancer Maternal Grandmother    Valvular heart disease Mother    Liver cancer Paternal Uncle    Liver cancer Paternal Grandmother    Prostate cancer Neg Hx     Social History Social History   Tobacco Use   Smoking status: Former Smoker    Types: Cigars    Quit date: 09/02/1994    Years since quitting: 24.1   Smokeless tobacco: Never Used  Substance Use Topics   Alcohol use: Yes    Alcohol/week: 0.0 - 1.0 standard drinks    Comment: occasional- 3 in the past year   Drug use: No    Review of Systems  Constitutional: Positive for fever Eyes: No visual changes. ENT: No sore throat. Cardiovascular: Positive for chest pain. Respiratory: Positive for nonproductive cough.  Denies shortness of breath. Gastrointestinal: No abdominal pain.  No nausea, no vomiting.  No  diarrhea.  No constipation. Genitourinary: Negative for dysuria. Musculoskeletal: Negative for back pain. Skin: Negative for rash. Neurological: Positive for headache and dizziness.  Negative for focal weakness or numbness.   ____________________________________________   PHYSICAL EXAM:  VITAL SIGNS: ED Triage Vitals [11/05/18 0550]  Enc Vitals  Group     BP (!) 149/72     Pulse Rate 79     Resp (!) 24     Temp 99.9 F (37.7 C)     Temp Source Oral     SpO2 92 %     Weight 260 lb (117.9 kg)     Height _0  (1.803 m)     Head Circumference      Peak Flow      Pain Score 6     Pain Loc      Pain Edu?      Excl. in [place]?     Constitutional: Alert and oriented.  Fatigued appearing and in mild acute distress. Eyes: Conjunctivae are normal. PERRL. EOMI. Head: Atraumatic. Nose: No congestion/rhinnorhea. Mouth/Throat: Mucous membranes are moist.  Oropharynx non-erythematous. Neck: No stridor.   Cardiovascular: Normal rate, irregular rhythm. Grossly normal heart sounds.  Good peripheral circulation. Respiratory: Increased respiratory effort.  No retractions. Lungs diminished bibasilarly. Gastrointestinal: Soft and nontender to light or deep palpation. No distention. No abdominal bruits. No CVA tenderness. Musculoskeletal: No lower extremity tenderness nor edema.  Chronic venous stasis skin color changes.  No joint effusions. Neurologic:  Normal speech and language. No gross focal neurologic deficits are appreciated. No gait instability. Skin:  Skin is warm, dry and intact. No rash noted.  No petechiae. Psychiatric: Mood and affect are normal. Speech and behavior are normal.  ____________________________________________   LABS (all labs ordered are listed, but only abnormal results are displayed)  Labs Reviewed  CULTURE, BLOOD (ROUTINE X 2)  CULTURE, BLOOD (ROUTINE X 2)  URINE CULTURE  SARS CORONAVIRUS 2 (HOSPITAL ORDER, [place] [facility])  LACTIC  ACID, PLASMA  LACTIC ACID, PLASMA  CBC WITH DIFFERENTIAL/PLATELET  COMPREHENSIVE METABOLIC PANEL  URINALYSIS, COMPLETE (UACMP) WITH MICROSCOPIC  TROPONIN I (HIGH SENSITIVITY)   ____________________________________________  EKG  ED ECG REPORT I, Valisa Karpel J, the attending physician, personally viewed and interpreted this ECG.   Date: 11/05/2018  EKG Time: 0553  Rate: 82  Rhythm: atrial fibrillation, rate 82  Axis: LAD  Intervals:nonspecific intraventricular conduction delay  ST&T Change: Nonspecific No significant change from 08/11/2018 ____________________________________________  RADIOLOGY  ED MD interpretation: Pending  Official radiology report(s): No results found.  ____________________________________________   PROCEDURES  Procedure(s) performed (including Critical Care):  Procedures   ____________________________________________   INITIAL IMPRESSION / ASSESSMENT AND PLAN / ED COURSE  As part of my medical decision making, I reviewed the following data within the [place] History obtained from family, Nursing notes reviewed and incorporated, EKG interpreted, Old EKG reviewed, Old chart reviewed, Patient signed out to Dr. Jari Pigg and Notes from prior ED visits     [name] was evaluated in Emergency Department on 11/05/2018 for the symptoms described in the history of present illness. He was evaluated in the context of the global COVID-19 pandemic, which necessitated consideration that the patient might be at risk for infection with the SARS-CoV-2 virus that causes COVID-19. Institutional protocols and algorithms that pertain to the evaluation of patients at risk for COVID-19 are in a state of rapid change based on information released by regulatory bodies including the CDC and federal and state organizations. These policies and algorithms were followed during the patient's care in the ED.   58 year old male with the above complex medical  issues who presents with fever, cough, chest pain and generalized weakness.  Differential diagnosis includes but is not limited to viral process, particularly  COVID-19, CAP, ACS, infectious, metabolic etiologies, etc.  Will obtain septic work-up, rapid COVID swab, CT head for headache, chest x-ray.  Anticipate hospitalization to this facility if patient is COVID negative.  If positive, he will require transfer to [facility].  Clinical Course as of Nov 16 5  Thu Nov 05, 2018  0647 Nurse reports patient ambulated a short distance to the commode and was quite fatigued and tachypneic on return to his bed.  2 L nasal cannula oxygen applied to room air saturation 91%.   [JS]  [id number] Care transferred to Dr. Jari Pigg at change of shift.  Laboratory, imaging results pending.   [JS]  0822 Creatinine(!): 1.55 [MF]  0955 Troponin I (High Sensitivity)(!): 35 [MF]    Clinical Course User Index [JS] Paulette Blanch, MD [MF] Vanessa Quinby, MD     ____________________________________________   FINAL CLINICAL IMPRESSION(S) / ED DIAGNOSES  Final diagnoses:  Generalized weakness  Shortness of breath  Fever, unspecified fever cause     ED Discharge Orders    None       Note:  This document was prepared using Dragon voice recognition software and may include unintentional dictation errors.   Paulette Blanch, MD 11/05/18 4373    Paulette Blanch, MD 11/17/18 [phone]

## 2018-11-05 NOTE — Progress Notes (Signed)
"  Living Better with Heart Failure" packet given to patient. Went over medications. Patient eating dinner at this time.No complaints, will continue to monitor.

## 2018-11-05 NOTE — ED Notes (Addendum)
Note made in error

## 2018-11-05 NOTE — Progress Notes (Signed)
PHARMACY -  BRIEF ANTIBIOTIC NOTE   Pharmacy has received consult(s) for Vancomycin and Cefepime from an ED provider.  The patient's profile has been reviewed for ht/wt/allergies/indication/available labs.    One time order(s) placed for Vancomycin and Cefepime  Further antibiotics/pharmacy consults should be ordered by admitting physician if indicated.                       Thank you, Vira Blanco 11/05/2018  10:25 AM

## 2018-11-05 NOTE — Plan of Care (Signed)
  Problem: Education: Goal: Knowledge of General Education information will improve Description: Including pain rating scale, medication(s)/side effects and non-pharmacologic comfort measures Outcome: Progressing   Problem: Activity: Goal: Risk for activity intolerance will decrease Outcome: Progressing   Problem: Nutrition: Goal: Adequate nutrition will be maintained Outcome: Progressing

## 2018-11-05 NOTE — ED Provider Notes (Addendum)
7:02 AM Assumed care for off going team.   Blood pressure (!) 149/72, pulse 79, temperature 99.9 F (37.7 C), temperature source Oral, resp. rate (!) 24, height 5' 11" (1.803 m), weight 117.9 kg, SpO2 92 %.  See their HPI for full report but in brief   CAD prior CABG, renal transplant on antirejection meds (at [facility]), using oxygen now (new) for SOB, cough. Pending labs, CT head. Plan for admission. If COVID + needs to go to [facility] given comorbidity.   Coronavirus testing is negative.  Urine without evidence of UTI.  Initial troponin was 34.  Patient does have a little bit of a bump in the creatinine at 1.55.  Chest x-ray shows some cardiomegaly but no obvious infiltrate.  9:25 AM discussed with patient's wife and he did have a temperature overnight of 100.5.  Patient was given Tylenol prior to arrival which is why the temperature was 99.  Given this and patient's symptoms of shortness of breath and cough and on immunosuppressants will cover broadly for now discussed with hospital team for admission for possible pneumonia versus bacteremia.  EKG atrial fibrillation rate of 56, no ST elevation, no T wave inversion, normal intervals  Admit to the hospital team.         Vanessa Ross, MD 11/05/18 0165    Vanessa [place], MD 11/05/18 1012

## 2018-11-06 DIAGNOSIS — R0602 Shortness of breath: Secondary | ICD-10-CM | POA: Diagnosis present

## 2018-11-06 LAB — HIV ANTIBODY (ROUTINE TESTING W REFLEX): HIV Screen 4th Generation wRfx: NONREACTIVE

## 2018-11-06 LAB — URINE CULTURE: Culture: <10000 — AB

## 2018-11-06 LAB — BASIC METABOLIC PANEL
Anion gap: 7 (ref 5–15)
BUN: 26 mg/dL — ABNORMAL HIGH (ref 6–20)
CO2: 31 mmol/L (ref 22–32)
Calcium: 9.5 mg/dL (ref 8.9–10.3)
Chloride: 98 mmol/L (ref 98–111)
Creatinine, Ser: 1.23 mg/dL (ref 0.61–1.24)
GFR calc Af Amer: >60 mL/min (ref >60)
GFR calc non Af Amer: >60 mL/min (ref >60)
Glucose, Bld: 197 mg/dL — ABNORMAL HIGH (ref 70–99)
Potassium: 3.7 mmol/L (ref 3.5–5.1)
Sodium: 136 mmol/L (ref 135–145)

## 2018-11-06 LAB — GLUCOSE, CAPILLARY: Glucose-Capillary: 205 mg/dL — ABNORMAL HIGH (ref 70–99)

## 2018-11-06 MED ORDER — CEFUROXIME AXETIL 500 MG PO TABS: 500.0000 mg | ORAL_TABLET | Freq: Two times a day (BID) | ORAL | 0 refills | Status: AC

## 2018-11-06 MED ORDER — TRAZODONE HCL 100 MG PO TABS
100.0000 mg | ORAL_TABLET | Freq: Every day | ORAL | Status: DC
  Administered 2018-11-06: 100 mg via ORAL
  Filled 2018-11-06: qty 1

## 2018-11-06 NOTE — Discharge Summary (Signed)
[name] at [facility], 58 y.o., DOB [birth year], MRN [id number]. Admission date: 11/05/2018 Discharge Date 11/06/2018 Primary MD [name], MD Admitting Physician Dustin Flock, MD  Admission Diagnosis  Shortness of breath [R06.02] Generalized weakness [R53.1] Fever, unspecified fever cause [R50.9]  Discharge Diagnosis   Active Problems:  acute on chronic diastolic CHF Fever likely viral in nature cultures negative so far COVID is negative Chest pressure Diabetes type 2 Essential hypertension History of renal transplant     [name]  is a 58 y.o. male with a known history of multiple medical problems including asthma, coronary artery disease, chronic atrial fibrillation, chronic combined systolic and diastolic CHF, depression, diabetes type 2, status post renal transplant who is on chronic immunosuppression therapy who is presenting to the hospital with complaint of fever, chest pain and shortness of breath.  Patient had a low-grade temperature.  Evaluation included negative chest x-ray urinalysis was negative.  Due to his history of transplant he was admitted and placed on antibiotics.  He had no further            Consults  None  Significant Tests:  See full reports for all details     Ct Head Wo Contrast  Result Date: 11/05/2018 CLINICAL DATA:  Headache EXAM: CT HEAD WITHOUT CONTRAST TECHNIQUE: Contiguous axial images were obtained from the base of the skull through the vertex without intravenous contrast. COMPARISON:  Report for head CT 08/16/2002 (images unavailable). FINDINGS: Brain: There is no acute intracranial hemorrhage or demarcated territorial infarction.No evidence of intracranial mass.No midline shift or extra-axial collection. Minimal scattered ill-defined hypoattenuation within the cerebral white matter is nonspecific, but consistent with chronic small vessel ischemic disease. Cerebral  volume is age appropriate. Vascular: No hyperdense vessel. Atherosclerotic calcification of the carotid artery siphons and vertebrobasilar system. Skull: Nonacute appearing defect within the left lateral pterygoid plate. Otherwise unremarkable. Sinuses/Orbits: The imaged globes and orbits demonstrate no acute abnormality. Mild partial opacification of left ethmoid air cells. No significant mastoid effusion. IMPRESSION: No evidence of acute intracranial abnormality. Minimal ill-defined hypoattenuation within the cerebral white matter is nonspecific, but consistent with chronic small vessel ischemic disease. Mild left ethmoid paranasal sinus disease as described. Electronically Signed   By: Kellie Simmering   On: 11/05/2018 09:45   Ct Chest Wo Contrast  Result Date: 11/05/2018 CLINICAL DATA:  Pneumonia. Shortness of breath. Cough and fever. EXAM: CT CHEST WITHOUT CONTRAST TECHNIQUE: Multidetector CT imaging of the chest was performed following the standard protocol without IV contrast. COMPARISON:  Chest radiograph November 05, 2018 FINDINGS: Cardiovascular: Enlarged heart. Calcific atherosclerotic disease of the coronary arteries. No pericardial effusion. Post CABG. Mediastinum/Nodes: Calcified right paratracheal lymph nodal mass measures 3 cm. Smaller calcified right hilar and peribronchial lymph nodes. Lungs/Pleura: Punctate calcifications throughout both lungs likely represent granulomatous disease. Mild atelectasis of the lingula. Upper Abdomen: Vascular calcifications of the splenic artery. Post partial gastrectomy. Musculoskeletal: Stigmata of diffuse idiopathic skeletal hyperostosis of the thoracic spine. IMPRESSION: 1. Enlarged heart. 2. Calcific atherosclerotic disease of the coronary arteries and aorta. 3. Sequelae of prior granulomatous disease. No evidence of acute airspace consolidation. 4. Mild atelectasis of the lingula. Aortic Atherosclerosis (ICD10-I70.0). Electronically Signed   By: Fidela Salisbury M.D.   On: 11/05/2018 11:33   Dg Chest Port 1 View  Result Date: 11/05/2018 CLINICAL DATA:  Cough and fever EXAM: PORTABLE CHEST 1 VIEW COMPARISON:  Aug 13, 2017 FINDINGS: There is no edema  or consolidation. There is cardiomegaly with pulmonary venous hypertension. Patient is status post coronary artery bypass grafting. No adenopathy. A calcified azygos lymph node is a stable finding. No bone lesions. IMPRESSION: Cardiomegaly with pulmonary vascular congestion. Status post coronary artery bypass grafting. No edema consolidation. Calcification in the azygos region is felt to be indicative of calcified azygos lymph node. Electronically Signed   By: Lowella Grip III M.D.   On: 11/05/2018 06:58       Today   Subjective:   [name]  Has some headache  Feels better compare to yest   Objective:   Blood pressure (!) 147/65, pulse (!) 59, temperature 98.4 F (36.9 C), temperature source Oral, resp. rate 16, height _0  (1.778 m), weight 112.4 kg, SpO2 97 %.  .  Intake/Output Summary (Last 24 hours) at 11/06/2018 1310 Last data filed at 11/06/2018 1307 Gross per 24 hour  Intake 572.91 ml  Output 1845 ml  Net -1272.09 ml    Exam VITAL SIGNS: Blood pressure (!) 147/65, pulse (!) 59, temperature 98.4 F (36.9 C), temperature source Oral, resp. rate 16, height _1  (1.778 m), weight 112.4 kg, SpO2 97 %.  GENERAL:  57 y.o.-year-old patient lying in the bed with no acute distress.  EYES: Pupils equal, round, reactive to light and accommodation. No scleral icterus. Extraocular muscles intact.  HEENT: Head atraumatic, normocephalic. Oropharynx and nasopharynx clear.  NECK:  Supple, no jugular venous distention. No thyroid enlargement, no tenderness.  LUNGS: Normal breath sounds bilaterally, no wheezing, rales,rhonchi or crepitation. No use of accessory muscles of respiration.  CARDIOVASCULAR: S1, S2 normal. No murmurs, rubs, or gallops.  ABDOMEN: Soft, nontender,  nondistended. Bowel sounds present. No organomegaly or mass.  EXTREMITIES: No pedal edema, cyanosis, or clubbing.  NEUROLOGIC: Cranial nerves II through XII are intact. Muscle strength 5/5 in all extremities. Sensation intact. Gait not checked.  PSYCHIATRIC: The patient is alert and oriented x 3.  SKIN: No obvious rash, lesion, or ulcer.   Data Review     CBC w Diff:  Lab Results  Component Value Date   WBC 6.5 11/05/2018   HGB 11.1 (L) 11/05/2018   HGB 11.0 (L) 04/02/2018   HCT 36.6 (L) 11/05/2018   HCT 34.5 (L) 04/02/2018   PLT 133 (L) 11/05/2018   PLT 188 04/02/2018   LYMPHOPCT 19 11/05/2018   LYMPHOPCT 9.9 06/16/2014   MONOPCT 7 11/05/2018   MONOPCT 10.6 06/16/2014   EOSPCT 0 11/05/2018   EOSPCT 1.1 06/16/2014   BASOPCT 1 11/05/2018   BASOPCT 1.1 06/16/2014   CMP:  Lab Results  Component Value Date   NA 136 11/06/2018   NA 137 05/04/2018   NA 141 06/16/2014   K 3.7 11/06/2018   K 3.4 (L) 06/16/2014   CL 98 11/06/2018   CL 104 06/16/2014   CO2 31 11/06/2018   CO2 30 06/16/2014   BUN 26 (H) 11/06/2018   BUN 30 (H) 05/04/2018   BUN 13 06/16/2014   CREATININE 1.23 11/06/2018   CREATININE 0.83 06/16/2014   PROT 7.7 11/05/2018   PROT 6.3 03/07/2015   PROT 6.9 06/16/2014   ALBUMIN 3.8 11/05/2018   ALBUMIN 3.9 03/07/2015   ALBUMIN 3.6 06/16/2014   BILITOT 1.1 11/05/2018   BILITOT 1.2 03/07/2015   BILITOT 1.9 (H) 06/16/2014   ALKPHOS 83 11/05/2018   ALKPHOS 98 06/16/2014   AST 19 11/05/2018   AST 14 (L) 06/16/2014   ALT 12 11/05/2018   ALT 9 (L) 06/16/2014  .  Micro Results Recent Results (from the past 240 hour(s))  Culture, blood (routine x 2)     Status: None (Preliminary result)   Collection Time: 11/05/18  6:48 AM   Specimen: BLOOD  Result Value Ref Range Status   Specimen Description BLOOD RIGHT FA  Final   Special Requests   Final    BOTTLES DRAWN AEROBIC AND ANAEROBIC Blood Culture adequate volume   Culture   Final    NO GROWTH 1  DAY Performed at [facility], [address] [place], [place] [address]    Report Status PENDING  Incomplete  Culture, blood (routine x 2)     Status: None (Preliminary result)   Collection Time: 11/05/18  6:49 AM   Specimen: BLOOD  Result Value Ref Range Status   Specimen Description BLOOD RIGHT HAND  Final   Special Requests   Final    BOTTLES DRAWN AEROBIC AND ANAEROBIC Blood Culture adequate volume   Culture   Final    NO GROWTH 1 DAY Performed at [facility], [address], [place], [place] [address]    Report Status PENDING  Incomplete  SARS Coronavirus 2 [facility] order, Performed in [place] hospital lab) Nasopharyngeal Nasopharyngeal Swab     Status: None   Collection Time: 11/05/18  6:49 AM   Specimen: Nasopharyngeal Swab  Result Value Ref Range Status   SARS Coronavirus 2 NEGATIVE NEGATIVE Final    Comment: (NOTE) If result is NEGATIVE SARS-CoV-2 target nucleic acids are NOT DETECTED. The SARS-CoV-2 RNA is generally detectable in upper and lower  respiratory specimens during the acute phase of infection. The lowest  concentration of SARS-CoV-2 viral copies this assay can detect is 250  copies / mL. A negative result does not preclude SARS-CoV-2 infection  and should not be used as the sole basis for treatment or other  patient management decisions.  A negative result may occur with  improper specimen collection / handling, submission of specimen other  than nasopharyngeal swab, presence of viral mutation(s) within the  areas targeted by this assay, and inadequate number of viral copies  (<250 copies / mL). A negative result must be combined with clinical  observations, patient history, and epidemiological information. If result is POSITIVE SARS-CoV-2 target nucleic acids are DETECTED. The SARS-CoV-2 RNA is generally detectable in upper and lower  respiratory specimens dur ing the acute phase of infection.  Positive  results are indicative  of active infection with SARS-CoV-2.  Clinical  correlation with patient history and other diagnostic information is  necessary to determine patient infection status.  Positive results do  not rule out bacterial infection or co-infection with other viruses. If result is PRESUMPTIVE POSTIVE SARS-CoV-2 nucleic acids MAY BE PRESENT.   A presumptive positive result was obtained on the submitted specimen  and confirmed on repeat testing.  While 2019 novel coronavirus  (SARS-CoV-2) nucleic acids may be present in the submitted sample  additional confirmatory testing may be necessary for epidemiological  and / or clinical management purposes  to differentiate between  SARS-CoV-2 and other Sarbecovirus currently known to infect humans.  If clinically indicated additional testing with an alternate test  methodology [phone]) is advised. The SARS-CoV-2 RNA is generally  detectable in upper and lower respiratory sp ecimens during the acute  phase of infection. The expected result is Negative. Fact Sheet for Patients:  StrictlyIdeas.no Fact Sheet for Healthcare Providers: BankingDealers.co.za This test is not yet approved or cleared by the [place] FDA and  has been authorized for detection and/or diagnosis of SARS-CoV-2 by FDA under an Emergency Use Authorization (EUA).  This EUA will remain in effect (meaning this test can be used) for the duration of the COVID-19 declaration under Section 564(b)(1) of the Act, 21 U.S.C. section 360bbb-3(b)(1), unless the authorization is terminated or revoked sooner. Performed at [facility], [name] [place], [place] [address]   Urine culture     Status: Abnormal   Collection Time: 11/05/18  7:01 AM   Specimen: Urine, Random  Result Value Ref Range Status   Specimen Description   Final    URINE, RANDOM Performed at [facility], [address] [place], [place] [address]     Special Requests   Final    Immunocompromised Performed at [facility], [address] [place], [place] [address]    Culture (A)  Final    <10,000 COLONIES/mL INSIGNIFICANT GROWTH Performed at [facility], [place] [address] [place], [place] [address]    Report Status 11/06/2018 FINAL  Final        Code Status Orders  (From admission, onward)         Start     Ordered   11/05/18 1225  Full code  Continuous     11/05/18 1224        Code Status History    Date Active Date Inactive Code Status Order ID Comments User Context   11/11/2016 1637 11/16/2016 1833 Full Code [id number]  Loletha Grayer, MD Inpatient   10/12/2015 2005 10/18/2015 1646 Full Code [id number]  Gladstone Lighter, MD Inpatient   08/30/2015 2122 09/04/2015 2217 Full Code [id number]  Fritzi Mandes, MD Inpatient   10/13/2014 0735 10/14/2014 1647 Full Code [id number]  Juluis Mire, MD Inpatient   Advance Care Planning Activity    Advance Directive Documentation     Most Recent Value  Type of Advance Directive  Healthcare Power of [place], Living will  Pre-existing out of facility DNR order (yellow form or pink MOST form)  -  "MOST" Form in Place?  -          Follow-up Information    [name], MD. Go on 11/12/2018.   Specialties: Internal Medicine, Pediatrics Why: appointment at 2:15pm Contact information: [place] [address] [phone]        [name], MD. Go on 11/16/2018.   Specialty: Cardiology Why: appointment at 1:30pm Contact information: [place] [address] [phone]        [name], MD. Go on 12/10/2018.   Specialty: Cardiology Why: virtual visit at 9:40am Contact information: [place] [address] [phone]           Discharge Medications   Allergies as of 11/06/2018      Reactions   Morphine Itching   Ibuprofen Other (See Comments)   Due to kidney  transplant      Medication List    TAKE these medications   albuterol 108 (90 Base) MCG/ACT inhaler Commonly known as: VENTOLIN HFA Inhale 2 puffs every 6 (six) hours as needed into the lungs for wheezing or shortness of breath.   amLODipine 5 MG tablet Commonly known as: NORVASC Take 1 tablet (5 mg total) by mouth daily.   beclomethasone 80 MCG/ACT inhaler Commonly known as: Qvar RediHaler Inhale 2 puffs into the lungs 2 (two) times daily. Rinse mouth after use.   buPROPion 150  MG 12 hr tablet Commonly known as: WELLBUTRIN SR TAKE 1 TABLET BY MOUTH TWICE DAILY   cefUROXime 500 MG tablet Commonly known as: CEFTIN Take 1 tablet (500 mg total) by mouth 2 (two) times daily for 5 days.   colchicine 0.6 MG tablet Take 1 tablet (0.6 mg total) by mouth 2 (two) times daily as needed.   Eliquis 5 MG Tabs tablet Generic drug: apixaban TAKE 1 TABLET (5 MG TOTAL) BY MOUTH 2 (TWO) TIMES DAILY.   Farxiga 10 MG Tabs tablet Generic drug: dapagliflozin propanediol Take 1 tablet by mouth daily.   fluticasone 50 MCG/ACT nasal spray Commonly known as: FLONASE Place 2 sprays into both nostrils daily.   furosemide 40 MG tablet Commonly known as: LASIX Take 1.5 tablets (60 mg total) by mouth every morning AND 1 tablet (40 mg total) every evening. What changed: See the new instructions.   HumaLOG KwikPen 100 UNIT/ML KwikPen Generic drug: insulin lispro Inject 5-10 Units into the skin 3 (three) times daily.   insulin glargine 100 unit/mL Sopn Commonly known as: LANTUS Inject 0.1 mLs (10 Units total) into the skin at bedtime. Currently using 15 units What changed:   how much to take  additional instructions   lisinopril 40 MG tablet Commonly known as: ZESTRIL TAKE 1 TABLET BY MOUTH ONCE DAILY   magnesium 30 MG tablet Take 30 mg by mouth daily.   metolazone 5 MG tablet Commonly known as: ZAROXOLYN TAKE 1 TABLET BY MOUTH AS NEEDED   montelukast 10 MG tablet Commonly known  as: SINGULAIR TAKE 1 TABLET BY MOUTH AT BEDTIME   multivitamin with minerals Tabs tablet Take 1 tablet by mouth daily.   Myfortic 180 MG EC tablet Generic drug: mycophenolate Take 360 mg by mouth 2 (two) times daily.   omeprazole 20 MG capsule Commonly known as: PRILOSEC TAKE 1 CAPSULE BY MOUTH ONCE DAILY   potassium chloride 10 MEQ CR capsule Commonly known as: MICRO-K TAKE 2 CAPSULES (20 MEQ TOTAL) BY MOUTH 2 (TWO) TIMES DAILY.   rosuvastatin 10 MG tablet Commonly known as: CRESTOR TAKE 1 TABLET BY MOUTH DAILY   sildenafil 100 MG tablet Commonly known as: VIAGRA TAKE 0.5-1 TABLETS (50-100 MG TOTAL) BY MOUTH DAILY AS NEEDED FOR ERECTILE DYSFUNCTION.   spironolactone 25 MG tablet Commonly known as: Aldactone Take 1 tablet (25 mg total) by mouth daily.   tacrolimus 0.5 MG capsule Commonly known as: PROGRAF Take 1 mg by mouth 2 (two) times daily.   tamsulosin 0.4 MG Caps capsule Commonly known as: FLOMAX Take 0.4 mg by mouth daily.          Total Time in preparing paper work, data evaluation and todays exam - 66 minutes  Dustin Flock M.D on 11/06/2018 at 1:10 PM [place]  [phone]

## 2018-11-06 NOTE — Plan of Care (Signed)
  Problem: Education: Goal: Knowledge of General Education information will improve Description: Including pain rating scale, medication(s)/side effects and non-pharmacologic comfort measures Outcome: Adequate for Discharge   Problem: Health Behavior/Discharge Planning: Goal: Ability to manage health-related needs will improve Outcome: Adequate for Discharge   Problem: Clinical Measurements: Goal: Ability to maintain clinical measurements within normal limits will improve Outcome: Adequate for Discharge Goal: Will remain free from infection Outcome: Adequate for Discharge Goal: Diagnostic test results will improve Outcome: Adequate for Discharge Goal: Respiratory complications will improve Outcome: Adequate for Discharge Goal: Cardiovascular complication will be avoided Outcome: Adequate for Discharge   Problem: Activity: Goal: Risk for activity intolerance will decrease Outcome: Adequate for Discharge   Problem: Nutrition: Goal: Adequate nutrition will be maintained Outcome: Adequate for Discharge   Problem: Coping: Goal: Level of anxiety will decrease Outcome: Adequate for Discharge   Problem: Elimination: Goal: Will not experience complications related to bowel motility Outcome: Adequate for Discharge Goal: Will not experience complications related to urinary retention Outcome: Adequate for Discharge   Problem: Pain Managment: Goal: General experience of comfort will improve Outcome: Adequate for Discharge   Problem: Skin Integrity: Goal: Risk for impaired skin integrity will decrease Outcome: Adequate for Discharge   Problem: Education: Goal: Ability to demonstrate management of disease process will improve Outcome: Adequate for Discharge Goal: Ability to verbalize understanding of medication therapies will improve Outcome: Adequate for Discharge Goal: Individualized Educational Video(s) Outcome: Adequate for Discharge   Problem: Activity: Goal: Capacity to  carry out activities will improve Outcome: Adequate for Discharge   Problem: Cardiac: Goal: Ability to achieve and maintain adequate cardiopulmonary perfusion will improve Outcome: Adequate for Discharge

## 2018-11-06 NOTE — Plan of Care (Signed)
  Problem: Education: Goal: Knowledge of General Education information will improve Description: Including pain rating scale, medication(s)/side effects and non-pharmacologic comfort measures Outcome: Progressing   Problem: Health Behavior/Discharge Planning: Goal: Ability to manage health-related needs will improve Outcome: Not Progressing Note: Patient is lethargic. Little interest in heart failure education   Problem: Activity: Goal: Risk for activity intolerance will decrease Outcome: Not Progressing   Problem: Safety: Goal: Ability to remain free from injury will improve Outcome: Completed/Met

## 2018-11-06 NOTE — Progress Notes (Signed)
Patient given discharge instructions with antibiotic prescription. IV's taken out and tele monitor off. Patient verbalized understanding without any questions or concerns. Patient's wife picking him up.

## 2018-11-06 NOTE — Progress Notes (Signed)
Inpatient Diabetes Program Recommendations  AACE/ADA: New Consensus Statement on Inpatient Glycemic Control   Target Ranges:  Prepandial:   less than 140 mg/dL      Peak postprandial:   less than 180 mg/dL (1-2 hours)      Critically ill patients:  140 - 180 mg/dL   Results for Carl, [name] (MRN [id number]) as of 11/06/2018 09:57  Ref. Range 11/05/2018 16:32 11/05/2018 21:27  Glucose-Capillary Latest Ref Range: 70 - 99 mg/dL 254 (H) 201 (H)  Results for Carl, [name] (MRN [id number]) as of 11/06/2018 09:57  Ref. Range 11/05/2018 06:48 11/06/2018 06:12  Glucose Latest Ref Range: 70 - 99 mg/dL 228 (H) 197 (H)  Hemoglobin A1C Latest Ref Range: 4.8 - 5.6 % 10.4 (H)    Review of Glycemic Control  Diabetes history: DM2 Outpatient Diabetes medications: Lantus 20 units QHS, Humalog 5-10 units TID, Farxiga 10 mg daily Current orders for Inpatient glycemic control: Lantus 10 units QHS, Novolog 0-9 units TID with meals, Novolog 0-5 units QHS  Inpatient Diabetes Program Recommendations:   HgbA1C: A1C 10.4% on 11/05/18 indicating an average glucose of 252 mg/dl. Prior A1C in Care Everywhere 9.5% on 06/04/18. Patient sees Dr. Gabriel Carina (Endocrinologist) and last seen on 09/03/18 and has upcoming appointment on 12/01/18.  NOTE: In reviewing chart noted patient sees Dr. Gabriel Carina for DM management and was last seen on 09/03/18. Per office note by Dr. Gabriel Carina on 09/03/18, patient is prescribed  Farxiga 10 mg daily,Lantus 28 units QHS, Humalog 5-21 units TID before meals:  Adjust the Humalog insulin BEFORE meals to: Take 5 units of Humalog If your sugar is 100-200 Take 7 units of Humalog if your sugar is 150-200 Take 9 units of Humalog if your sugar is 201-250 Take 12 units of Humalog if your sugar is 251-300 Take 15 units of Humalog if your sugar is 301-350 Take 18 units of Humalog if your sugar is 351-400 Take 21 units of Humalog if your sugar is over 401  Current listed outpatient medications different that what  is prescribed by Dr. Gabriel Carina. Per Care Everywhere, patient's next appointment with Dr. Gabriel Carina is on 12/01/18.  Spoke with patient regarding DM control and outpatient DM medications. Patient reports that he is taking Lantus 20 units QHS, Humalog 3-14 units TID, and Farxiga 10 mg daily for DM control. Patient reports that his glucose is usually in the 200's mg/dl and he is frustrated with DM control as he has been walking daily and eating healthier but glucose remains elevated. Explained that per Dr. Joycie Peek office note on 09/03/18, she prescribed Lantus 28 units QHS and Humalog 5-21 units based on glucose, and Farxiga 10 mg daily for DM control. Patient states he was not aware that he was suppose to be taking Lantus 28 units and he states that he has the Humalog correction scale at home she has prescribed but he is not using as much as prescribed. Encouraged patient to take Lantus, Humalog, and Farxiga as prescribed by Dr. Gabriel Carina and to follow up with Dr. Gabriel Carina regarding glycemic control. Discussed FreeStyle Libre and explained how it could help provide more information for him and for Dr. Gabriel Carina to get DM under better control. Encouraged patient to talk with Dr. Gabriel Carina about the [facility] if he is interested. Discussed current A1C of 10.4% on 11/05/18 and explained that A1C indicates an average glucose of 252 mg/dl which correlates with patients reported glucose trends.  Discussed A1C and glucose goals and stressed importance of improving  DM control to decrease risk of further complications from uncontrolled DM. Patient verbalized understanding of information and states that he does not have any questions at this time related to DM.  Thanks, Barnie Alderman, RN, MSN, CDE Diabetes Coordinator Inpatient Diabetes Program [phone] (Team Pager from 8am to 5pm)

## 2018-11-07 LAB — GLUCOSE, CAPILLARY: Glucose-Capillary: 185 mg/dL — ABNORMAL HIGH (ref 70–99)

## 2018-11-09 ENCOUNTER — Other Ambulatory Visit: Payer: Self-pay | Admitting: *Deleted

## 2018-11-09 ENCOUNTER — Telehealth: Payer: Self-pay

## 2018-11-09 NOTE — Patient Outreach (Addendum)
[facility]) Care Management  11/09/2018  [name] 11/16/1960 [id number]   Transition of care telephone call  Referral received: 11/06/18 Initial outreach: 11/09/18 Insurance: [place]  Initial unsuccessful telephone call to patient's mobile number in order to complete transition of care assessment; no answer, left HIPAA compliant voicemail message requesting return call.   Objective: Per the electronic medical record, [name] was hospitalized at [facility] from 8/13-8/14/20 for generalized weakness, dyspnea and fever. He was negative for the Covid 19 virus.  Comorbidities include: CHF- he is seen at the heart failure clinic, chronic atrial fibrillation, IDDM- type 2 with most recent Hgb A1C= 10.4% on 11/05/18, HTN, CAD, asthma, OSA, GERD, hypogonadism, mild cognitive impairment,  ESRD s/p renal transplant, s/p gastric bypass surgery, depression, gout, hyperlipidemia, morbid obesity  He was discharged to home on 11/06/18 without the need for home health services or durable medical equipment per the discharge summary.   Plan: If no return call from the patient by end of business day today, this RNCM will route unsuccessful outreach letter with [place] Management pamphlet and 24 hour Nurse Advice Line Magnet to [place] Management clinical pool to be mailed to patient's home address. This RNCM will attempt another outreach within 4 business days.  Barrington Ellison RN,CCM,CDE [place] Management Coordinator Office Phone [phone] Office Fax [phone]

## 2018-11-09 NOTE — Telephone Encounter (Signed)
Transition Care Management Follow-up Telephone Call   Date discharged? 11/06/18   How have you been since you were released from the hospital?    Do you understand why you were in the hospital? No did not get clear answers.   Do you understand the discharge instructions? Yes  Where were you discharged to? Home   Items Reviewed:  ( all below to be reviewed at follow up tomorrow within 48 hour window).   Medications reviewed:   Allergies reviewed: }  Dietary changes reviewed: {  Referrals reviewed: {   Functional Questionnaire:   Activities of Daily Living (ADLs):   He states they are independent in the following: dressing, bathing, toileting grooming, feeding meal prep, meds and ambulation. States they require assistance with the following: requires no assistance.    Any transportation issues/concerns?: none   Any patient concerns? Yes, do not understand why I am so sick.  Still having a lot of headaches and severe dizziness.  I did not get any answers from the hospital and am concerned.   Confirmed importance and date/time of follow-up visits scheduled Yes  Provider Appointment booked with  Dr. Silvio Pate - moved appointment up from 8/20 to tomorrow 8/18 due to questions and concerns.   Confirmed with patient if condition begins to worsen call PCP or go to the ER.  Patient was given the office number and encouraged to call back with question or concerns.  : Yes

## 2018-11-10 ENCOUNTER — Other Ambulatory Visit: Payer: Self-pay

## 2018-11-10 ENCOUNTER — Ambulatory Visit (INDEPENDENT_AMBULATORY_CARE_PROVIDER_SITE_OTHER): Payer: 59 | Admitting: Internal Medicine

## 2018-11-10 ENCOUNTER — Encounter: Payer: Self-pay | Admitting: Internal Medicine

## 2018-11-10 DIAGNOSIS — R51 Headache: Secondary | ICD-10-CM

## 2018-11-10 DIAGNOSIS — R509 Fever, unspecified: Secondary | ICD-10-CM | POA: Diagnosis not present

## 2018-11-10 DIAGNOSIS — R5383 Other fatigue: Secondary | ICD-10-CM | POA: Diagnosis not present

## 2018-11-10 LAB — CULTURE, BLOOD (ROUTINE X 2)
Culture: NO GROWTH
Culture: NO GROWTH
Special Requests: ADEQUATE
Special Requests: ADEQUATE

## 2018-11-10 NOTE — Progress Notes (Signed)
Subjective:    Patient ID: [name], male    DOB: 01-Oct-1960, 58 y.o.   MRN: [id number]  HPI Virtual visit for hospital follow up Identification done Reviewed billing and he gave consent He is at home and I am in my office  Reviewed discharge summary, lab and radiologic testing and cultures (all negative) I also spoke to him while he was in the hospital  Developed fever-- highest was 101 Chills and couldn't get warm---and bad headache Went to ER--worried about COVID Everything was negative No pneumonia on CT scan Some improvement --sent home on antibiotic  Still has some chills--some better than before Some headache--across forehead to back of head Tylenol helps for a couple of hours No cough or SOB  Current Outpatient Medications on File Prior to Visit  Medication Sig Dispense Refill  . albuterol (PROVENTIL HFA;VENTOLIN HFA) 108 (90 Base) MCG/ACT inhaler Inhale 2 puffs every 6 (six) hours as needed into the lungs for wheezing or shortness of breath. 1 Inhaler 2  . amLODipine (NORVASC) 5 MG tablet Take 1 tablet (5 mg total) by mouth daily. 90 tablet 3  . beclomethasone (QVAR REDIHALER) 80 MCG/ACT inhaler Inhale 2 puffs into the lungs 2 (two) times daily. Rinse mouth after use. 1 Inhaler 2  . buPROPion (WELLBUTRIN SR) 150 MG 12 hr tablet TAKE 1 TABLET BY MOUTH TWICE DAILY 60 tablet 5  . cefUROXime (CEFTIN) 500 MG tablet Take 1 tablet (500 mg total) by mouth 2 (two) times daily for 5 days. 10 tablet 0  . colchicine 0.6 MG tablet Take 1 tablet (0.6 mg total) by mouth 2 (two) times daily as needed. 60 tablet 1  . ELIQUIS 5 MG TABS tablet TAKE 1 TABLET (5 MG TOTAL) BY MOUTH 2 (TWO) TIMES DAILY. 180 tablet 1  . FARXIGA 10 MG TABS tablet Take 1 tablet by mouth daily.    . fluticasone (FLONASE) 50 MCG/ACT nasal spray Place 2 sprays into both nostrils daily. 16 g 6  . furosemide (LASIX) 40 MG tablet Take 1.5 tablets (60 mg total) by mouth every morning AND 1 tablet (40 mg total)  every evening. (Patient taking differently: Take 2 tablets (3m total) by mouth every morning AND 1 tablet (40 mg total) every evening.) 225 tablet 3  . HUMALOG KWIKPEN 100 UNIT/ML KwikPen Inject 5-10 Units into the skin 3 (three) times daily.     . insulin glargine (LANTUS) 100 unit/mL SOPN Inject 0.1 mLs (10 Units total) into the skin at bedtime. Currently using 15 units (Patient taking differently: Inject 28 Units into the skin at bedtime. ) 15 mL 11  . lisinopril (ZESTRIL) 40 MG tablet TAKE 1 TABLET BY MOUTH ONCE DAILY 90 tablet 3  . magnesium 30 MG tablet Take 30 mg by mouth daily.    . metolazone (ZAROXOLYN) 5 MG tablet TAKE 1 TABLET BY MOUTH AS NEEDED 30 tablet 3  . montelukast (SINGULAIR) 10 MG tablet TAKE 1 TABLET BY MOUTH AT BEDTIME 30 tablet 0  . Multiple Vitamin (MULTIVITAMIN WITH MINERALS) TABS tablet Take 1 tablet by mouth daily.    . mycophenolate (MYFORTIC) 180 MG EC tablet Take 360 mg by mouth 2 (two) times daily.     .Marland Kitchenomeprazole (PRILOSEC) 20 MG capsule TAKE 1 CAPSULE BY MOUTH ONCE DAILY 90 capsule 3  . potassium chloride (MICRO-K) 10 MEQ CR capsule TAKE 2 CAPSULES (20 MEQ TOTAL) BY MOUTH 2 (TWO) TIMES DAILY. 360 capsule 4  . rosuvastatin (CRESTOR) 10 MG tablet TAKE  1 TABLET BY MOUTH DAILY 90 tablet 3  . sildenafil (VIAGRA) 100 MG tablet TAKE 0.5-1 TABLETS (50-100 MG TOTAL) BY MOUTH DAILY AS NEEDED FOR ERECTILE DYSFUNCTION. 10 tablet 11  . tacrolimus (PROGRAF) 0.5 MG capsule Take 1 mg by mouth 2 (two) times daily.    . tamsulosin (FLOMAX) 0.4 MG CAPS capsule Take 0.4 mg by mouth daily.     Marland Kitchen spironolactone (ALDACTONE) 25 MG tablet Take 1 tablet (25 mg total) by mouth daily. 30 tablet 11   No current facility-administered medications on file prior to visit.     Allergies  Allergen Reactions  . Morphine Itching  . Ibuprofen Other (See Comments)    Due to kidney transplant    Past Medical History:  Diagnosis Date  . Allergy    seasonal  . Amnestic MCI (mild cognitive  impairment with memory loss)   . Asthma   . Benign neoplasm of colon   . CAD (coronary artery disease) 2005   a.  Status post four-vessel CABG in 2005  . Chronic atrial fibrillation    a. CHADS2VASc 4 (CHF, HTN, DM, vascular disease); b. Eliquis  . Chronic combined systolic and diastolic CHF (congestive heart failure) ([place])    a.  TTE 7/18: EF 45-50%, moderately dilated LA, RVSF normal, PASP normal  . Chronic venous stasis dermatitis of both lower extremities   . Cytomegaloviral disease ([place]) 2017  . Depression   . Diabetes mellitus   . Diverticulosis   . Dyspnea   . Erectile dysfunction   . ESRD (end stage renal disease) ([place]) 2005   a. HD 2004-2012; b. S/p renal transplant in 2012  . GERD (gastroesophageal reflux disease)   . Gout   . Hyperlipidemia    low since renal failure  . Hypertension   . Kidney transplant status, cadaveric 2012  . Obesity   . Pneumonia   . Purpura ([place])   . Sleep apnea    bipap with oxygen 2 l  . Trigger finger   . Ulcer 05/2016   Left shin  . Wears glasses     Past Surgical History:  Procedure Laterality Date  . BARIATRIC SURGERY    . CARDIAC CATHETERIZATION     [facility]  . CATARACT EXTRACTION W/PHACO Right 10/29/2017   Procedure: CATARACT EXTRACTION PHACO AND INTRAOCULAR LENS PLACEMENT ([place])  RIGHT DIABETIC;  Surgeon: [name], MD;  Location: [place];  Service: Ophthalmology;  Laterality: Right;  Diabetic - insulin  . CATARACT EXTRACTION W/PHACO Left 11/18/2017   Procedure: CATARACT EXTRACTION PHACO AND INTRAOCULAR LENS PLACEMENT ([place]) LEFT IVA/TOPICAL;  Surgeon: [name], MD;  Location: [place];  Service: Ophthalmology;  Laterality: Left;  DIABETES - insulin sleep apnea  . COLONOSCOPY WITH PROPOFOL N/A 04/22/2013   Procedure: COLONOSCOPY WITH PROPOFOL;  Surgeon: [name], MD;  [facility];  Service: Endoscopy;  Laterality: N/A;  . CORONARY ARTERY BYPASS GRAFT  2005   X 4  . DG  ANGIO AV SHUNT*L*     right and left upper arms  . FASCIOTOMY  03/03/2012   Procedure: FASCIOTOMY;  Surgeon: [name], MD;  Location: [place];  Service: Orthopedics;  Laterality: Right;  FASCIOTOMY RIGHT SMALL FINGER  . FASCIOTOMY Left 08/17/2013   Procedure: FASCIOTOMY LEFT RING;  Surgeon: [name], MD;  Location: [place];  Service: Orthopedics;  Laterality: Left;  . INCISION AND DRAINAGE ABSCESS Left 10/15/2015   Procedure: INCISION AND DRAINAGE ABSCESS;  Surgeon: [name], MD;  Location: [facility];  Service: General;  Laterality: Left;  . KIDNEY TRANSPLANT  09/13/2010   cadaver--at [facility]  . RIGHT HEART CATH N/A 11/15/2016   Procedure: RIGHT HEART CATH;  Surgeon: [name], MD;  Location: [place] [facility];  Service: Cardiovascular;  Laterality: N/A;  . TYMPANIC MEMBRANE REPAIR  1/12   left  . VASECTOMY      Family History  Problem Relation Age of Onset  . Heart disease Father   . Kidney failure Father   . Kidney disease Father   . Diabetes Maternal Grandmother   . Breast cancer Maternal Grandmother   . Valvular heart disease Mother   . Liver cancer Paternal Uncle   . Liver cancer Paternal Grandmother   . Prostate cancer Neg Hx     Social History   Socioeconomic History  . Marital status: Married    Spouse name: Not on file  . Number of children: 2  . Years of education: Not on file  . Highest education level: Not on file  Occupational History  . Occupation: Academic librarian: unemployed    Comment: disabled due to kidney failure  Social Needs  . Financial resource strain: Not on file  . Food insecurity    Worry: Not on file    Inability: Not on file  . Transportation needs    Medical: Not on file    Non-medical: Not on file  Tobacco Use  . Smoking status: Former Smoker    Types: Cigars    Quit date: 09/02/1994    Years since quitting: 24.2  . Smokeless tobacco: Never Used  Substance and  Sexual Activity  . Alcohol use: Yes    Alcohol/week: 0.0 - 1.0 standard drinks    Comment: occasional- 3 in the past year  . Drug use: No  . Sexual activity: Not on file  Lifestyle  . Physical activity    Days per week: Not on file    Minutes per session: Not on file  . Stress: Not on file  Relationships  . Social Herbalist on phone: Not on file    Gets together: Not on file    Attends religious service: Not on file    Active member of club or organization: Not on file    Attends meetings of clubs or organizations: Not on file    Relationship status: Not on file  . Intimate partner violence    Fear of current or ex partner: Not on file    Emotionally abused: Not on file    Physically abused: Not on file    Forced sexual activity: Not on file  Other Topics Concern  . Not on file  Social History Narrative   In school for culinary arts--- will finish 12/16      Has living will   Wife is health care POA   Would accept resuscitation attempts   Hasn't considered tube feedings   Review of Systems No known tick or mosquito bites No rash Urinary stream is slower--slight burning now No N/V Appetite is off--eating a little though Bowels are fine    Objective:   Physical Exam  Constitutional: He appears well-developed. No distress.  Looks and sounds normal now  Respiratory: Effort normal. No respiratory distress.  Psychiatric: He has a normal mood and affect. His behavior is normal.           Assessment & Plan:

## 2018-11-10 NOTE — Assessment & Plan Note (Signed)
With prominent headache, fatigue and chills He wondered about recurrence of his CMV---unlikely Picture more consistent with mosquito borne illness---but clearly improving now and no treatment anyway---so would not recommend further testing Asked him to let me know if he isn't mostly better by next week He will continue tylenol for headache prn

## 2018-11-12 ENCOUNTER — Ambulatory Visit: Payer: 59 | Admitting: Internal Medicine

## 2018-11-12 ENCOUNTER — Ambulatory Visit: Payer: Self-pay | Admitting: *Deleted

## 2018-11-12 ENCOUNTER — Other Ambulatory Visit: Payer: Self-pay | Admitting: *Deleted

## 2018-11-12 NOTE — Patient Outreach (Addendum)
[facility]) Care Management  11/12/2018  CEBERT DETTMANN 1960-10-26 [id number]   Transition of care telephone call  Referral received: 11/06/18 Initial outreach: 11/09/18 Insurance: [place]  Second unsuccessful telephone call to patient's mobile number in order to complete transition of care assessment; no answer, left HIPAA compliant voicemail message requesting return call.   Objective: Per the electronic medical record, Mr. Overby was hospitalized at [facility] from 8/13-8/14/20 for generalized weakness, dyspnea and fever. He was negative for the Covid 19 virus.  Comorbidities include: CHF- he is seen at the heart failure clinic, chronic atrial fibrillation, IDDM- type 2 with most recent Hgb A1C= 10.4% on 11/05/18, HTN, CAD, asthma, OSA, GERD, hypogonadism, mild cognitive impairment,  ESRD s/p renal transplant, s/p gastric bypass surgery, depression, gout, hyperlipidemia, morbid obesity  He was discharged to home on 11/06/18 without the need for home health services or durable medical equipment per the discharge summary.  He completed his post hospital follow up appointment with his primary care provider on 11/10/18.   Plan: If no return call from the patient, this RNCM will attempt another outreach within 4 business days.  Barrington Ellison RN,CCM,CDE [place] Management Coordinator Office Phone [phone] Office Fax [phone]

## 2018-11-16 ENCOUNTER — Encounter (HOSPITAL_COMMUNITY): Payer: 59

## 2018-11-17 ENCOUNTER — Ambulatory Visit (HOSPITAL_COMMUNITY)
Admission: RE | Admit: 2018-11-17 | Discharge: 2018-11-17 | Disposition: A | Discharge status: Home / Self Care | Payer: 59 | Source: Ambulatory Visit | Attending: Internal Medicine | Admitting: Internal Medicine

## 2018-11-17 ENCOUNTER — Ambulatory Visit: Payer: Self-pay | Admitting: *Deleted

## 2018-11-17 ENCOUNTER — Other Ambulatory Visit: Payer: Self-pay | Admitting: *Deleted

## 2018-11-17 ENCOUNTER — Encounter (HOSPITAL_COMMUNITY): Payer: Self-pay

## 2018-11-17 ENCOUNTER — Other Ambulatory Visit: Payer: Self-pay

## 2018-11-17 VITALS — BP 142/80 | HR 62 | Wt 260.4 lb

## 2018-11-17 DIAGNOSIS — Z7951 Long term (current) use of inhaled steroids: Secondary | ICD-10-CM | POA: Insufficient documentation

## 2018-11-17 DIAGNOSIS — Z886 Allergy status to analgesic agent status: Secondary | ICD-10-CM | POA: Insufficient documentation

## 2018-11-17 DIAGNOSIS — Z8249 Family history of ischemic heart disease and other diseases of the circulatory system: Secondary | ICD-10-CM | POA: Diagnosis not present

## 2018-11-17 DIAGNOSIS — N183 Chronic kidney disease, stage 3 (moderate): Secondary | ICD-10-CM | POA: Insufficient documentation

## 2018-11-17 DIAGNOSIS — Z841 Family history of disorders of kidney and ureter: Secondary | ICD-10-CM | POA: Insufficient documentation

## 2018-11-17 DIAGNOSIS — Z9841 Cataract extraction status, right eye: Secondary | ICD-10-CM | POA: Insufficient documentation

## 2018-11-17 DIAGNOSIS — I272 Pulmonary hypertension, unspecified: Secondary | ICD-10-CM | POA: Insufficient documentation

## 2018-11-17 DIAGNOSIS — I10 Essential (primary) hypertension: Secondary | ICD-10-CM | POA: Diagnosis not present

## 2018-11-17 DIAGNOSIS — Z87891 Personal history of nicotine dependence: Secondary | ICD-10-CM | POA: Insufficient documentation

## 2018-11-17 DIAGNOSIS — Z885 Allergy status to narcotic agent status: Secondary | ICD-10-CM | POA: Insufficient documentation

## 2018-11-17 DIAGNOSIS — G4733 Obstructive sleep apnea (adult) (pediatric): Secondary | ICD-10-CM | POA: Diagnosis not present

## 2018-11-17 DIAGNOSIS — I5032 Chronic diastolic (congestive) heart failure: Secondary | ICD-10-CM

## 2018-11-17 DIAGNOSIS — E785 Hyperlipidemia, unspecified: Secondary | ICD-10-CM | POA: Insufficient documentation

## 2018-11-17 DIAGNOSIS — Z79899 Other long term (current) drug therapy: Secondary | ICD-10-CM | POA: Diagnosis not present

## 2018-11-17 DIAGNOSIS — E1122 Type 2 diabetes mellitus with diabetic chronic kidney disease: Secondary | ICD-10-CM | POA: Insufficient documentation

## 2018-11-17 DIAGNOSIS — I2510 Atherosclerotic heart disease of native coronary artery without angina pectoris: Secondary | ICD-10-CM | POA: Insufficient documentation

## 2018-11-17 DIAGNOSIS — Z7901 Long term (current) use of anticoagulants: Secondary | ICD-10-CM | POA: Insufficient documentation

## 2018-11-17 DIAGNOSIS — I482 Chronic atrial fibrillation, unspecified: Secondary | ICD-10-CM

## 2018-11-17 DIAGNOSIS — Z951 Presence of aortocoronary bypass graft: Secondary | ICD-10-CM | POA: Diagnosis not present

## 2018-11-17 DIAGNOSIS — Z794 Long term (current) use of insulin: Secondary | ICD-10-CM | POA: Diagnosis not present

## 2018-11-17 DIAGNOSIS — Z9842 Cataract extraction status, left eye: Secondary | ICD-10-CM | POA: Insufficient documentation

## 2018-11-17 DIAGNOSIS — I130 Hypertensive heart and chronic kidney disease with heart failure and stage 1 through stage 4 chronic kidney disease, or unspecified chronic kidney disease: Secondary | ICD-10-CM | POA: Insufficient documentation

## 2018-11-17 DIAGNOSIS — J45909 Unspecified asthma, uncomplicated: Secondary | ICD-10-CM | POA: Insufficient documentation

## 2018-11-17 DIAGNOSIS — Z961 Presence of intraocular lens: Secondary | ICD-10-CM | POA: Insufficient documentation

## 2018-11-17 DIAGNOSIS — M109 Gout, unspecified: Secondary | ICD-10-CM | POA: Diagnosis not present

## 2018-11-17 DIAGNOSIS — R0602 Shortness of breath: Secondary | ICD-10-CM | POA: Diagnosis present

## 2018-11-17 DIAGNOSIS — Z940 Kidney transplant status: Secondary | ICD-10-CM | POA: Insufficient documentation

## 2018-11-17 DIAGNOSIS — Z9884 Bariatric surgery status: Secondary | ICD-10-CM | POA: Insufficient documentation

## 2018-11-17 NOTE — Progress Notes (Signed)
Heart Failure Note   Date:  11/17/2018   ID:  Carl, [name] 28-Sep-1960, MRN [id number]  Location: Home  Provider location: [address] [place] Type of Visit:  Established patient  PCP:  [name], MD  Cardiologist:  [name] Primary HF: [name]  Chief Complaint: Shortness of breath   History of Present Illness: [name] is a 58 y.o. male referred by [name] for evaluation of CHF who presents via audio/video conferencing for a  telehealth visit today.     Today,  he denies symptoms of cough, fevers, chills, or new SOB worrisome for COVID 19.    Patient has history of CAD s/p CABG 2005, chronic atrial fibrillation on Eliquis, ESRD s/p renal transplant 2012, anc chronic diastolic CHF.  His renal function has been fairly stable, most recent creatinine 1.43.  He has been struggling with volume overload/CHF.  He has a history of ischemic cardiomyopathy with EF as low as 35-40% in the past but last echo in 3/20 showed EF 60-65%.  There is evidence for RV failure with pulmonary hypertension by echo.    Admitted 11/05/18 low grade fever and shortness of breath. HS Trop negative. Covid 19 negative. Blood CX negative. Placed on antibiotics and discharged to home the next day.   Today he returns for HF follow up. Overall feeling mcuh fine. Denies SOB/PND/Orthopnea. Able to walk 4 miles. Appetite ok. No fever or chills. Weight at home has been stable. Says he takes lasix when his weight 260-262 pounds. Using BiPap every night.  Taking all medications.  Labs (7/19): LDL 49 Labs (2/20): K 3.7, creatinine 1.43 Labs (4/20): K 4.3, creatinine 1.24 Labs (11/06/18): K 3.7 creatinine 1.23   PMH: 1. CAD: s/p CABG x 4 in [place] in 2005.  - Cardiolite (1/20): EF 47%, small fixed apical septal defect, no ischemia.  Low risk study.  2. Asthma 3. Gout 4. Atrial fibrillation: Chronic.  5. ESRD s/p renal transplantation at [facility] in 2012.  6. Anemia of chronic  disease 7. Type II diabetes 8. HTN 9. Hyperlipidemia 10. OSA: On bipap.  11. Ischemic cardiomyopathy: EF 35-40% in past.  - Cardiolite (1/20) with EF 47%.  - Echo (1/20): EF 50-55%, mild MR, RV moderately dilated with moderately decreased systolic function, PASP 73 mmHg, moderate TR.   - Echo (3/20): EF 60-65%, PASP 75 mmHg, mildly dilated RV with mildly decreased systolic function.  12. Obesity: s/p gastric bypass in 2015.  13. Bradycardia with beta blocker.   Past Surgical History:  Procedure Laterality Date  . BARIATRIC SURGERY    . CARDIAC CATHETERIZATION     [facility]  . CATARACT EXTRACTION W/PHACO Right 10/29/2017   Procedure: CATARACT EXTRACTION PHACO AND INTRAOCULAR LENS PLACEMENT ([place])  RIGHT DIABETIC;  Surgeon: [name], MD;  Location: [place];  Service: Ophthalmology;  Laterality: Right;  Diabetic - insulin  . CATARACT EXTRACTION W/PHACO Left 11/18/2017   Procedure: CATARACT EXTRACTION PHACO AND INTRAOCULAR LENS PLACEMENT ([place]) LEFT IVA/TOPICAL;  Surgeon: [name], MD;  Location: [place];  Service: Ophthalmology;  Laterality: Left;  DIABETES - insulin sleep apnea  . COLONOSCOPY WITH PROPOFOL N/A 04/22/2013   Procedure: COLONOSCOPY WITH PROPOFOL;  Surgeon: [name], MD;  [facility];  Service: Endoscopy;  Laterality: N/A;  . CORONARY ARTERY BYPASS GRAFT  2005   X 4  . DG ANGIO AV SHUNT*L*     right and left upper arms  .  FASCIOTOMY  03/03/2012   Procedure: FASCIOTOMY;  Surgeon: [name], MD;  Location: [place];  Service: Orthopedics;  Laterality: Right;  FASCIOTOMY RIGHT SMALL FINGER  . FASCIOTOMY Left 08/17/2013   Procedure: FASCIOTOMY LEFT RING;  Surgeon: [name], MD;  Location: [place];  Service: Orthopedics;  Laterality: Left;  . INCISION AND DRAINAGE ABSCESS Left 10/15/2015   Procedure: INCISION AND DRAINAGE ABSCESS;  Surgeon: [name], MD;  Location: [facility];   Service: General;  Laterality: Left;  . KIDNEY TRANSPLANT  09/13/2010   cadaver--at [facility]  . RIGHT HEART CATH N/A 11/15/2016   Procedure: RIGHT HEART CATH;  Surgeon: [name], MD;  Location: [place] [facility];  Service: Cardiovascular;  Laterality: N/A;  . TYMPANIC MEMBRANE REPAIR  1/12   left  . VASECTOMY       Current Outpatient Medications  Medication Sig Dispense Refill  . albuterol (PROVENTIL HFA;VENTOLIN HFA) 108 (90 Base) MCG/ACT inhaler Inhale 2 puffs every 6 (six) hours as needed into the lungs for wheezing or shortness of breath. 1 Inhaler 2  . amLODipine (NORVASC) 5 MG tablet Take 1 tablet (5 mg total) by mouth daily. 90 tablet 3  . beclomethasone (QVAR REDIHALER) 80 MCG/ACT inhaler Inhale 2 puffs into the lungs 2 (two) times daily. Rinse mouth after use. 1 Inhaler 2  . buPROPion (WELLBUTRIN SR) 150 MG 12 hr tablet TAKE 1 TABLET BY MOUTH TWICE DAILY 60 tablet 5  . colchicine 0.6 MG tablet Take 1 tablet (0.6 mg total) by mouth 2 (two) times daily as needed. 60 tablet 1  . ELIQUIS 5 MG TABS tablet TAKE 1 TABLET (5 MG TOTAL) BY MOUTH 2 (TWO) TIMES DAILY. 180 tablet 1  . FARXIGA 10 MG TABS tablet Take 1 tablet by mouth daily.    . fluticasone (FLONASE) 50 MCG/ACT nasal spray Place 2 sprays into both nostrils daily. 16 g 6  . furosemide (LASIX) 40 MG tablet Take 40 mg by mouth. 51m in the AM and 40 mg in the PM    . HUMALOG KWIKPEN 100 UNIT/ML KwikPen Inject 5-10 Units into the skin 3 (three) times daily.     . insulin glargine (LANTUS) 100 unit/mL SOPN Inject 0.1 mLs (10 Units total) into the skin at bedtime. Currently using 15 units (Patient taking differently: Inject 28 Units into the skin at bedtime. ) 15 mL 11  . lisinopril (ZESTRIL) 40 MG tablet TAKE 1 TABLET BY MOUTH ONCE DAILY 90 tablet 3  . magnesium 30 MG tablet Take 30 mg by mouth daily.    . metolazone (ZAROXOLYN) 5 MG tablet TAKE 1 TABLET BY MOUTH AS NEEDED 30 tablet 3  . montelukast (SINGULAIR) 10 MG tablet  TAKE 1 TABLET BY MOUTH AT BEDTIME 30 tablet 0  . Multiple Vitamin (MULTIVITAMIN WITH MINERALS) TABS tablet Take 1 tablet by mouth daily.    . mycophenolate (MYFORTIC) 180 MG EC tablet Take 360 mg by mouth 2 (two) times daily.     .Marland Kitchenomeprazole (PRILOSEC) 20 MG capsule TAKE 1 CAPSULE BY MOUTH ONCE DAILY 90 capsule 3  . potassium chloride (MICRO-K) 10 MEQ CR capsule TAKE 2 CAPSULES (20 MEQ TOTAL) BY MOUTH 2 (TWO) TIMES DAILY. 360 capsule 4  . rosuvastatin (CRESTOR) 10 MG tablet TAKE 1 TABLET BY MOUTH DAILY 90 tablet 3  . sildenafil (VIAGRA) 100 MG tablet TAKE 0.5-1 TABLETS (50-100 MG TOTAL) BY MOUTH DAILY AS NEEDED FOR ERECTILE DYSFUNCTION. 10 tablet 11  .  spironolactone (ALDACTONE) 25 MG tablet Take 1 tablet (25 mg total) by mouth daily. 30 tablet 11  . tacrolimus (PROGRAF) 0.5 MG capsule Take 1 mg by mouth 2 (two) times daily.    . tamsulosin (FLOMAX) 0.4 MG CAPS capsule Take 0.4 mg by mouth daily.      No current facility-administered medications for this encounter.     Allergies:   Morphine and Ibuprofen   Social History:  The patient  reports that he quit smoking about 24 years ago. His smoking use included cigars. He has never used smokeless tobacco. He reports current alcohol use. He reports that he does not use drugs.   Family History:  The patient's family history includes Breast cancer in his maternal grandmother; Diabetes in his maternal grandmother; Heart disease in his father; Kidney disease in his father; Kidney failure in his father; Liver cancer in his paternal grandmother and paternal uncle; Valvular heart disease in his mother.   ROS:  Please see the history of present illness.   All other systems are personally reviewed and negative.  Vitals:   11/17/18 1346  BP: (!) 142/80  Pulse: 62  SpO2: 98%    Exam:   General:  Well appearing. No resp difficulty HEENT: normal Neck: supple. no JVD. Carotids 2+ bilat; no bruits. No lymphadenopathy or thryomegaly appreciated. Cor:  PMI nondisplaced. Regular rate & rhythm. No rubs, gallops or murmurs. Lungs: clear Abdomen: soft, nontender, nondistended. No hepatosplenomegaly. No bruits or masses. Good bowel sounds. Extremities: no cyanosis, clubbing, rash, edema.   Neuro: alert & orientedx3, cranial nerves grossly intact. moves all 4 extremities w/o difficulty. Affect pleasant  Recent Labs: 11/05/2018: ALT 12; Hemoglobin 11.1; Platelets 133 11/06/2018: BUN 26; Creatinine, Ser 1.23; Potassium 3.7; Sodium 136  Personally reviewed   Wt Readings from Last 3 Encounters:  11/17/18 118.1 kg (260 lb 6.4 oz)  11/10/18 116.6 kg (257 lb)  11/06/18 112.4 kg (247 lb 11.7 oz)    ASSESSMENT AND PLAN:  1. Chronic diastolic CHF: With prominent RV failure/pulmonary hypertension. ?If RV failure is related to severe OSA, he was a remote smoker.  Echo in 3/20 with EF 60-65%, mildly dilated RV with mildly decreased systolic function, severe pulmonary hypertension.    Will need to be careful with diuresis given history of renal transplantation. He has been  tried on torsemide , but paradoxically he feels like Lasix worked better.  NYHAII. Volume status stable. Continue current diuretic regimen.   Continue current lisinopril and spironolactone.  2. Atrial fibrillation: Chronic. Rate controlled.  He will continue Eliquis. Not on beta blocker with history of bradycardia.  Rate is controlled.  3. CAD: S/p CABG 2005.  Low risk Cardiolite in 1/20.   - No ASA given Eliquis use.  - Continue Crestor 10 mg daily, good lipids in 7/19.  4. CKD: Stage 3.  S/p renal transplantation in 2012. He remains on mycophenolate and tacrolimus.   5. OSA: On Bipap. Continue nightly.  6. HTN: Stable.  7. Pulmonary hypertension: Severe pulmonary hypertension by 3/20 echo.  Given his gender, most likely this is a combination of group 2 (pulmonary venous hypertension) and group 3 (OSA, ?unrecognized OHS).    Stable today. Follow up with Dr Aundra Dubin in 6 months.    Jeanmarie Hubert, NP  11/17/2018  [facility] [address] [place] [address] [phone] (office) [phone] (fax)

## 2018-11-17 NOTE — Patient Instructions (Signed)
Please follow up with the [facility] in 6 months. We currently do not have that schedule so if you have not heard from [name] by January, please call us at [phone] in order to make that appointment.  At the [facility], you and your health needs are our priority. As part of our continuing mission to provide you with exceptional heart care, we have created designated Provider Care Teams. These Care Teams include your primary Cardiologist (physician) and Advanced Practice Providers (APPs- Physician Assistants and Nurse Practitioners) who all work together to provide you with the care you need, when you need it.   You may see any of the following providers on your designated Care Team at your next follow up: Marland Kitchen Dr Glori Bickers . Dr Loralie Champagne . Darrick Grinder, NP   Please be sure to bring in all your medications bottles to every appointment.

## 2018-11-17 NOTE — Patient Outreach (Signed)
[name]) Care Management  11/17/2018  [name] [birth year] [id number]  Transition of care telephone call  Referral received:11/06/18 Initial outreach:11/09/18 Insurance: [employer] unsuccessful telephone call to patient'smobilenumber in order to complete transition of care assessment; no answer, left HIPAA compliant voicemail message requesting return call.   Objective: Per the electronic medical record, [name] hospitalized [facility] from8/13-8/14/3fr generalized weakness, dyspnea and fever. He was negative for the Covid 19 virus. Comorbidities include:CHF- he is seen at the heart failure clinic, chronic atrial fibrillation, IDDM- type 2 with most recent Hgb A1C= 10.4% on 11/05/18, HTN, CAD, asthma, OSA, GERD, hypogonadism, mild cognitive impairment, ESRD s/p renal transplant, s/p gastric bypass surgery, depression, gout, hyperlipidemia, morbid obesity  He was discharged to home on8/14/20 without the need for home health services or durable medical equipment per the discharge summary.  He completed his post [name] follow up appointment with his primary care provider on 11/10/18.  He has an appointment with the [facility] scheduled today at 2:00 pm.  Plan: If no return call from patient, will close [place] in 10 business days after initial post [name] discharge outreach, on 11/20/18.  JBarrington EllisonRN,CCM,CDE TEwingManagement Coordinator Office Phone [phone] Fax [phone]

## 2018-11-20 ENCOUNTER — Other Ambulatory Visit: Payer: Self-pay | Admitting: *Deleted

## 2018-11-20 NOTE — Patient Outreach (Signed)
[facility]) Care Management  11/20/2018  MAREO PORTILLA Dec 20, 1960 [id number]  Transition of Care Case Closure- Unsuccessful Outreach Insurance: [employer] Choice Plan  Objective: Per the electronic medical record, Mr. Spainhour hospitalized [facility] from8/13-8/14/92fr generalized weakness, dyspnea and fever. He was negative for the Covid 19 virus. Comorbidities include:CHF- he is seen at the heart failure clinic, chronic atrial fibrillation, IDDM- type 2 with most recent Hgb A1C= 10.4% on 11/05/18, HTN, CAD, asthma, OSA, GERD, hypogonadism, mild cognitive impairment,ESRDs/p renal transplant, s/p gastric bypass surgery, depression, gout, hyperlipidemia, morbid obesity  He was discharged to home on8/14/20 without the need for home health services or durable medical equipment per the discharge summary. He completed his post hospital follow up appointment with his primary care provider on 11/10/18. He completed his post hospital  appointment with the [facility] on 11/17/18.  Assessment: Unable to complete post hospital discharge transition of care assessment; no return call from patient after 3 attempts and no response to request to contact this RNCM in unsuccessful outreach letter mailed to patient's home on 11/09/18.   Plan: Case closed [place] services as it has been 10 business days since initial post hospital discharge outreach attempt.  JBarrington EllisonRN,CCM,CDE TMcDonaldManagement Coordinator Office Phone [phone] Fax [phone]

## 2018-11-24 ENCOUNTER — Encounter: Payer: 59 | Admitting: Internal Medicine

## 2018-11-24 DIAGNOSIS — Z794 Long term (current) use of insulin: Secondary | ICD-10-CM | POA: Diagnosis not present

## 2018-11-24 DIAGNOSIS — E1165 Type 2 diabetes mellitus with hyperglycemia: Secondary | ICD-10-CM | POA: Diagnosis not present

## 2018-11-26 DIAGNOSIS — E662 Morbid (severe) obesity with alveolar hypoventilation: Secondary | ICD-10-CM | POA: Diagnosis not present

## 2018-11-26 DIAGNOSIS — G4733 Obstructive sleep apnea (adult) (pediatric): Secondary | ICD-10-CM | POA: Diagnosis not present

## 2018-12-01 DIAGNOSIS — N183 Chronic kidney disease, stage 3 (moderate): Secondary | ICD-10-CM | POA: Diagnosis not present

## 2018-12-01 DIAGNOSIS — E1169 Type 2 diabetes mellitus with other specified complication: Secondary | ICD-10-CM | POA: Diagnosis not present

## 2018-12-01 DIAGNOSIS — Z9289 Personal history of other medical treatment: Secondary | ICD-10-CM | POA: Diagnosis not present

## 2018-12-01 DIAGNOSIS — E1165 Type 2 diabetes mellitus with hyperglycemia: Secondary | ICD-10-CM | POA: Diagnosis not present

## 2018-12-01 DIAGNOSIS — E1142 Type 2 diabetes mellitus with diabetic polyneuropathy: Secondary | ICD-10-CM | POA: Diagnosis not present

## 2018-12-01 DIAGNOSIS — E669 Obesity, unspecified: Secondary | ICD-10-CM | POA: Diagnosis not present

## 2018-12-01 DIAGNOSIS — E1122 Type 2 diabetes mellitus with diabetic chronic kidney disease: Secondary | ICD-10-CM | POA: Diagnosis not present

## 2018-12-01 DIAGNOSIS — Z794 Long term (current) use of insulin: Secondary | ICD-10-CM | POA: Diagnosis not present

## 2018-12-01 DIAGNOSIS — I10 Essential (primary) hypertension: Secondary | ICD-10-CM | POA: Diagnosis not present

## 2018-12-01 DIAGNOSIS — E113293 Type 2 diabetes mellitus with mild nonproliferative diabetic retinopathy without macular edema, bilateral: Secondary | ICD-10-CM | POA: Diagnosis not present

## 2018-12-10 ENCOUNTER — Encounter: Payer: Self-pay | Admitting: Cardiovascular Disease

## 2018-12-10 ENCOUNTER — Telehealth (INDEPENDENT_AMBULATORY_CARE_PROVIDER_SITE_OTHER): Payer: 59 | Admitting: Cardiovascular Disease

## 2018-12-10 ENCOUNTER — Other Ambulatory Visit: Payer: Self-pay

## 2018-12-10 VITALS — BP 150/89 | HR 83 | Ht 71.0 in | Wt 260.0 lb

## 2018-12-10 DIAGNOSIS — I2510 Atherosclerotic heart disease of native coronary artery without angina pectoris: Secondary | ICD-10-CM | POA: Diagnosis not present

## 2018-12-10 DIAGNOSIS — E785 Hyperlipidemia, unspecified: Secondary | ICD-10-CM

## 2018-12-10 DIAGNOSIS — I482 Chronic atrial fibrillation, unspecified: Secondary | ICD-10-CM

## 2018-12-10 DIAGNOSIS — I5022 Chronic systolic (congestive) heart failure: Secondary | ICD-10-CM

## 2018-12-10 NOTE — Progress Notes (Signed)
Virtual Visit via Video Note   This visit type was conducted due to national recommendations for restrictions regarding the COVID-19 Pandemic (e.g. social distancing) in an effort to limit this patient's exposure and mitigate transmission in our community.  Due to his co-morbid illnesses, this patient is at least at moderate risk for complications without adequate follow up.  This format is felt to be most appropriate for this patient at this time.  All issues noted in this document were discussed and addressed.  A limited physical exam was performed with this format.  Please refer to the patient's chart for his consent to telehealth for [facility].   Date:  12/10/2018   ID:  Carl, Utz [name], MRN [id number]  Patient Location: Home Provider Location: Home  PCP:  [name], MD  Cardiologist:  [name], MD  Electrophysiologist:  None   Evaluation Performed:  Follow-Up Visit  Chief Complaint: Leg edema   History of Present Illness:    [name] is a 58 y.o. male who was seen via video visit for follow-up regarding complex cardiac issues.  He hashistory of CAD status post CABG in 2005, chronic combined systolic and diastolic CHF secondary to ICM with prior EF of 35 to 40%now improved to 50 to 55% by echo in 03/2018, chronic A. fib on Eliquis,pulmonary hypertension, ESRD previously on hemodialysis from 2004 through 2012 status post kidney transplantin 2012, anemia of chronic disease, DM2, HTN, HLD, beta-blocker induced bradycardia, obesity status post gastric bypass surgery in 11/2013, and sleep apneaon BiPAP.  Stress test on 04/10/2018 showed no significant ischemia, small region of fixed perfusion defect in the inferoapical and apical septal region possibly attenuation artifact, EF 47%, low risk study.   Echocardiogram in March showed normal ejection fraction with severe pulmonary hypertension.  Estimated systolic pulmonary pressure was 74 mmHg.  He  was seen few months ago for intermittent episodes of extreme dizziness with heart rate in the high 40s.  No syncope.  He had a ZIO Patch monitor done which showed no significant bradycardic events except during sleep time when his heart rate was in 30s.  Overall, there was no indication for a pacemaker.  He has been doing reasonably well overall.  He was admitted briefly in August with fatigue and low-grade fever and shortness of breath.  He is doing better now.  He is 4 pounds above his dry weight and he has doubled the dose of furosemide.  No chest pain.  No further episodes of dizziness.  The patient does not have symptoms concerning for COVID-19 infection (fever, chills, cough, or new shortness of breath).    Past Medical History:  Diagnosis Date  . Allergy    seasonal  . Amnestic MCI (mild cognitive impairment with memory loss)   . Asthma   . Benign neoplasm of colon   . CAD (coronary artery disease) 2005   a.  Status post four-vessel CABG in 2005  . Chronic atrial fibrillation    a. CHADS2VASc 4 (CHF, HTN, DM, vascular disease); b. Eliquis  . Chronic combined systolic and diastolic CHF (congestive heart failure) ([place])    a.  TTE 7/18: EF 45-50%, moderately dilated LA, RVSF normal, PASP normal  . Chronic venous stasis dermatitis of both lower extremities   . Cytomegaloviral disease ([name]) 2017  . Depression   . Diabetes mellitus   . Diverticulosis   . Dyspnea   . Erectile dysfunction   . ESRD (end stage renal disease) (  [place]) 2005   a. HD 2004-2012; b. S/p renal transplant in 2012  . GERD (gastroesophageal reflux disease)   . Gout   . Hyperlipidemia    low since renal failure  . Hypertension   . Kidney transplant status, cadaveric 2012  . Obesity   . Pneumonia   . Purpura ([place])   . Sleep apnea    bipap with oxygen 2 l  . Trigger finger   . Ulcer 05/2016   Left shin  . Wears glasses    Past Surgical History:  Procedure Laterality Date  . BARIATRIC SURGERY    . CARDIAC  CATHETERIZATION     [facility]  . CATARACT EXTRACTION W/PHACO Right 10/29/2017   Procedure: CATARACT EXTRACTION PHACO AND INTRAOCULAR LENS PLACEMENT ([place])  RIGHT DIABETIC;  Surgeon: [name], MD;  Location: [place];  Service: Ophthalmology;  Laterality: Right;  Diabetic - insulin  . CATARACT EXTRACTION W/PHACO Left 11/18/2017   Procedure: CATARACT EXTRACTION PHACO AND INTRAOCULAR LENS PLACEMENT ([place]) LEFT IVA/TOPICAL;  Surgeon: [name], MD;  Location: [place];  Service: Ophthalmology;  Laterality: Left;  DIABETES - insulin sleep apnea  . COLONOSCOPY WITH PROPOFOL N/A 04/22/2013   Procedure: COLONOSCOPY WITH PROPOFOL;  Surgeon: [name], MD;  Location: [facility];  Service: Endoscopy;  Laterality: N/A;  . CORONARY ARTERY BYPASS GRAFT  2005   X 4  . DG ANGIO AV SHUNT*L*     right and left upper arms  . FASCIOTOMY  03/03/2012   Procedure: FASCIOTOMY;  Surgeon: [name], MD;  Location: [place];  Service: Orthopedics;  Laterality: Right;  FASCIOTOMY RIGHT SMALL FINGER  . FASCIOTOMY Left 08/17/2013   Procedure: FASCIOTOMY LEFT RING;  Surgeon: [name], MD;  Location: [place];  Service: Orthopedics;  Laterality: Left;  . INCISION AND DRAINAGE ABSCESS Left 10/15/2015   Procedure: INCISION AND DRAINAGE ABSCESS;  Surgeon: [name], MD;  Location: [facility];  Service: General;  Laterality: Left;  . KIDNEY TRANSPLANT  09/13/2010   cadaver--at [facility]  . RIGHT HEART CATH N/A 11/15/2016   Procedure: RIGHT HEART CATH;  Surgeon: [name], MD;  Location: [place] [facility];  Service: Cardiovascular;  Laterality: N/A;  . TYMPANIC MEMBRANE REPAIR  1/12   left  . VASECTOMY       No outpatient medications have been marked as taking for the 12/10/18 encounter (Telemedicine) with [name], MD.     Allergies:   Morphine and Ibuprofen   Social History   Tobacco Use  . Smoking status: Former  Smoker    Types: Cigars    Quit date: 09/02/1994    Years since quitting: 24.2  . Smokeless tobacco: Never Used  Substance Use Topics  . Alcohol use: Yes    Alcohol/week: 0.0 - 1.0 standard drinks    Comment: occasional- 3 in the past year  . Drug use: No     Family Hx: The patient's family history includes Breast cancer in his maternal grandmother; Diabetes in his maternal grandmother; Heart disease in his father; Kidney disease in his father; Kidney failure in his father; Liver cancer in his paternal grandmother and paternal uncle; Valvular heart disease in his mother. There is no history of Prostate cancer.  ROS:   Please see the history of present illness.     All other systems reviewed and are negative.   Prior CV studies:   The following studies were reviewed today:  Labs/Other Tests and Data Reviewed:    EKG:  No ECG reviewed.  Recent Labs: 11/05/2018: ALT 12; Hemoglobin 11.1; Platelets 133 11/06/2018: BUN 26; Creatinine, Ser 1.23; Potassium 3.7; Sodium 136   Recent Lipid Panel Lab Results  Component Value Date/Time   CHOL 89 10/09/2017   CHOL 161 03/07/2015 08:23 AM   TRIG 73 03/06/2016 07:36 AM   HDL 34 (L) 03/06/2016 07:36 AM   HDL 38 (L) 03/07/2015 08:23 AM   CHOLHDL 2.7 03/06/2016 07:36 AM   LDLCALC 49 10/09/2017   LDLCALC 105 (H) 03/07/2015 08:23 AM   LDLDIRECT 41.9 04/20/2012 04:18 PM    Wt Readings from Last 3 Encounters:  11/17/18 260 lb 6.4 oz (118.1 kg)  11/10/18 257 lb (116.6 kg)  11/06/18 247 lb 11.7 oz (112.4 kg)     Objective:    Vital Signs:  There were no vitals taken for this visit.   VITAL SIGNS:  reviewed GEN:  no acute distress EYES:  sclerae anicteric, EOMI - Extraocular Movements Intact RESPIRATORY:  normal respiratory effort, symmetric expansion SKIN:  no rash, lesions or ulcers. MUSCULOSKELETAL:  no obvious deformities. NEURO:  alert and oriented x 3, no obvious focal deficit PSYCH:  normal affect  ASSESSMENT & PLAN:      1.   intermittent bradycardia: Outpatient monitor showed no significant bradycardia requiring pacemaker. He does have IVCD with left bundle branch block morphology and thus he is at high risk for significant bradycardic events.  Continue to monitor closely.  2.  Chronic systolic heart failure:   Most recent EF was normal but he does have significant diastolic heart failure and severe pulmonary hypertension.  He is 4 pounds above his dry weight and has doubled the dose of furosemide until his weight goes back to baseline.  3. Chronic atrial fibrillation: Rate is controlled without medications. He is tolerating anticoagulation with Eliquis.  He has been in atrial fibrillation since 2016.  4. Coronary artery disease involving native coronary arteries without angina: Continue medical therapy.   5. Hyperlipidemia: Continue treatment with rosuvastatin 10 mg daily. Most recent LDL was 43.   6.  Status post kidney transplant: Most recent creatinine was 1.24    COVID-19 Education: The signs and symptoms of COVID-19 were discussed with the patient and how to seek care for testing (follow up with PCP or arrange E-visit).  The importance of social distancing was discussed today.  Time:   Today, I have spent 8 minutes with the patient with telehealth technology discussing the above problems.     Medication Adjustments/Labs and Tests Ordered: Current medicines are reviewed at length with the patient today.  Concerns regarding medicines are outlined above.   Tests Ordered: No orders of the defined types were placed in this encounter.   Medication Changes: No orders of the defined types were placed in this encounter.   Follow Up:  In Person in 3 month(s)  Signed, [name], MD  12/10/2018 9:37 AM    [place]

## 2018-12-10 NOTE — Patient Instructions (Signed)
Medication Instructions:  Continue same medications.  If you need a refill on your cardiac medications before your next appointment, please call your pharmacy.   Lab work: none If you have labs (blood work) drawn today and your tests are completely normal, you will receive your results only by: Marland Kitchen MyChart Message (if you have MyChart) OR . A paper copy in the mail If you have any lab test that is abnormal or we need to change your treatment, we will call you to review the results.  Testing/Procedures: none  Follow-Up: At [facility], you and your health needs are our priority.  As part of our continuing mission to provide you with exceptional heart care, we have created designated Provider Care Teams.  These Care Teams include your primary Cardiologist (physician) and Advanced Practice Providers (APPs -  Physician Assistants and Nurse Practitioners) who all work together to provide you with the care you need, when you need it. You will need a follow up appointment in 3 months.  Please call our office 2 months in advance to schedule this appointment.  You may see [name], MD or one of the following Advanced Practice Providers on your designated Care Team:   Murray Hodgkins, NP Christell Faith, PA-C . Marrianne Mood, PA-C  Any Other Special Instructions Will Be Listed Below (If Applicable).

## 2018-12-15 ENCOUNTER — Other Ambulatory Visit: Payer: Self-pay | Admitting: Family

## 2018-12-17 ENCOUNTER — Telehealth: Payer: Self-pay

## 2018-12-17 MED ORDER — AMLODIPINE BESYLATE 5 MG PO TABS: 5.0000 mg | ORAL_TABLET | Freq: Every day | ORAL | 3 refills | Status: DC

## 2018-12-17 NOTE — Telephone Encounter (Signed)
Pharmacy requested refill of amlodipine , rx sent to [place]. [name]

## 2018-12-23 ENCOUNTER — Other Ambulatory Visit: Payer: Self-pay

## 2018-12-23 ENCOUNTER — Ambulatory Visit (INDEPENDENT_AMBULATORY_CARE_PROVIDER_SITE_OTHER): Payer: 59 | Admitting: Internal Medicine

## 2018-12-23 ENCOUNTER — Encounter: Payer: Self-pay | Admitting: Internal Medicine

## 2018-12-23 VITALS — BP 130/74 | HR 61 | Temp 98.4°F | Ht 71.0 in | Wt 272.0 lb

## 2018-12-23 DIAGNOSIS — I809 Phlebitis and thrombophlebitis of unspecified site: Secondary | ICD-10-CM | POA: Insufficient documentation

## 2018-12-23 DIAGNOSIS — Z23 Encounter for immunization: Secondary | ICD-10-CM

## 2018-12-23 DIAGNOSIS — I2510 Atherosclerotic heart disease of native coronary artery without angina pectoris: Secondary | ICD-10-CM

## 2018-12-23 DIAGNOSIS — M65359 Trigger finger, unspecified little finger: Secondary | ICD-10-CM

## 2018-12-23 DIAGNOSIS — I8002 Phlebitis and thrombophlebitis of superficial vessels of left lower extremity: Secondary | ICD-10-CM

## 2018-12-23 HISTORY — DX: Trigger finger, unspecified little finger: M65.359

## 2018-12-23 HISTORY — DX: Phlebitis and thrombophlebitis of unspecified site: I80.9

## 2018-12-23 MED ORDER — TADALAFIL 20 MG PO TABS: 10.0000 mg | ORAL_TABLET | ORAL | 11 refills | Status: DC | PRN

## 2018-12-23 NOTE — Addendum Note (Signed)
Addended by: Pilar Grammes on: 12/23/2018 12:37 PM   Modules accepted: Orders

## 2018-12-23 NOTE — Progress Notes (Signed)
Subjective:    Patient ID: [name], male    DOB: [birth year], 59 y.o.   MRN: [id number]  HPI Having trouble with trigger fingers--prompted this visit  Left 5th finger now flexed completely at PIP Increasing pain Right 5th finger operated on in the past---but now starting to flex again at PIP Some pain there as well  Has knot in left thigh "Hurts like crazy" Started 2 days ago No trauma  Current Outpatient Medications on File Prior to Visit  Medication Sig Dispense Refill  . albuterol (PROVENTIL HFA;VENTOLIN HFA) 108 (90 Base) MCG/ACT inhaler Inhale 2 puffs every 6 (six) hours as needed into the lungs for wheezing or shortness of breath. 1 Inhaler 2  . amLODipine (NORVASC) 5 MG tablet Take 1 tablet (5 mg total) by mouth daily. 90 tablet 3  . beclomethasone (QVAR REDIHALER) 80 MCG/ACT inhaler Inhale 2 puffs into the lungs 2 (two) times daily. Rinse mouth after use. 1 Inhaler 2  . buPROPion (WELLBUTRIN SR) 150 MG 12 hr tablet TAKE 1 TABLET BY MOUTH TWICE DAILY 60 tablet 5  . colchicine 0.6 MG tablet Take 1 tablet (0.6 mg total) by mouth 2 (two) times daily as needed. 60 tablet 1  . ELIQUIS 5 MG TABS tablet TAKE 1 TABLET (5 MG TOTAL) BY MOUTH 2 (TWO) TIMES DAILY. 180 tablet 1  . FARXIGA 10 MG TABS tablet Take 1 tablet by mouth daily.    . fluticasone (FLONASE) 50 MCG/ACT nasal spray Place 2 sprays into both nostrils daily. 16 g 6  . furosemide (LASIX) 40 MG tablet Take 40 mg by mouth. 24m in the AM and 40 mg in the PM    . gabapentin (NEURONTIN) 300 MG capsule Take 300 mg by mouth at bedtime.     .Marland KitchenHUMALOG KWIKPEN 100 UNIT/ML KwikPen Inject 5-10 Units into the skin 3 (three) times daily.     . insulin aspart (NOVOLOG FLEXPEN) 100 UNIT/ML FlexPen Inject into the skin 3 (three) times daily with meals.     . insulin glargine (LANTUS) 100 unit/mL SOPN Inject 0.1 mLs (10 Units total) into the skin at bedtime. Currently using 15 units (Patient taking differently: Inject 30 Units into  the skin at bedtime. ) 15 mL 11  . lisinopril (ZESTRIL) 40 MG tablet TAKE 1 TABLET BY MOUTH ONCE DAILY 90 tablet 3  . magnesium 30 MG tablet Take 30 mg by mouth daily.    . metolazone (ZAROXOLYN) 5 MG tablet TAKE 1 TABLET BY MOUTH AS NEEDED 30 tablet 3  . montelukast (SINGULAIR) 10 MG tablet TAKE 1 TABLET BY MOUTH AT BEDTIME 30 tablet 0  . Multiple Vitamin (MULTIVITAMIN WITH MINERALS) TABS tablet Take 1 tablet by mouth daily.    . mycophenolate (MYFORTIC) 180 MG EC tablet Take 360 mg by mouth 2 (two) times daily.     .Marland Kitchenomeprazole (PRILOSEC) 20 MG capsule TAKE 1 CAPSULE BY MOUTH ONCE DAILY 90 capsule 3  . potassium chloride (MICRO-K) 10 MEQ CR capsule TAKE 2 CAPSULES (20 MEQ TOTAL) BY MOUTH 2 (TWO) TIMES DAILY. 360 capsule 4  . rosuvastatin (CRESTOR) 10 MG tablet TAKE 1 TABLET BY MOUTH DAILY 90 tablet 3  . sildenafil (VIAGRA) 100 MG tablet TAKE 0.5-1 TABLETS (50-100 MG TOTAL) BY MOUTH DAILY AS NEEDED FOR ERECTILE DYSFUNCTION. 10 tablet 11  . tacrolimus (PROGRAF) 0.5 MG capsule Take 1 mg by mouth 2 (two) times daily.    . tamsulosin (FLOMAX) 0.4 MG CAPS capsule Take 0.4 mg by  mouth daily.     Marland Kitchen spironolactone (ALDACTONE) 25 MG tablet Take 1 tablet (25 mg total) by mouth daily. 30 tablet 11   No current facility-administered medications on file prior to visit.     Allergies  Allergen Reactions  . Morphine Itching  . Ibuprofen Other (See Comments)    Due to kidney transplant    Past Medical History:  Diagnosis Date  . Allergy    seasonal  . Amnestic MCI (mild cognitive impairment with memory loss)   . Asthma   . Benign neoplasm of colon   . CAD (coronary artery disease) 2005   a.  Status post four-vessel CABG in 2005  . Chronic atrial fibrillation    a. CHADS2VASc 4 (CHF, HTN, DM, vascular disease); b. Eliquis  . Chronic combined systolic and diastolic CHF (congestive heart failure) ([place])    a.  TTE 7/18: EF 45-50%, moderately dilated LA, RVSF normal, PASP normal  . Chronic venous  stasis dermatitis of both lower extremities   . Cytomegaloviral disease ([place]) 2017  . Depression   . Diabetes mellitus   . Diverticulosis   . Dyspnea   . Erectile dysfunction   . ESRD (end stage renal disease) ([place]) 2005   a. HD 2004-2012; b. S/p renal transplant in 2012  . GERD (gastroesophageal reflux disease)   . Gout   . Hyperlipidemia    low since renal failure  . Hypertension   . Kidney transplant status, cadaveric 2012  . Obesity   . Pneumonia   . Purpura ([place])   . Sleep apnea    bipap with oxygen 2 l  . Trigger finger   . Ulcer 05/2016   Left shin  . Wears glasses     Past Surgical History:  Procedure Laterality Date  . BARIATRIC SURGERY    . CARDIAC CATHETERIZATION     [facility]  . CATARACT EXTRACTION W/PHACO Right 10/29/2017   Procedure: CATARACT EXTRACTION PHACO AND INTRAOCULAR LENS PLACEMENT ([place])  RIGHT DIABETIC;  Surgeon: [name], MD;  Location: [place];  Service: Ophthalmology;  Laterality: Right;  Diabetic - insulin  . CATARACT EXTRACTION W/PHACO Left 11/18/2017   Procedure: CATARACT EXTRACTION PHACO AND INTRAOCULAR LENS PLACEMENT ([place]) LEFT IVA/TOPICAL;  Surgeon: [name], MD;  Location: [place];  Service: Ophthalmology;  Laterality: Left;  DIABETES - insulin sleep apnea  . COLONOSCOPY WITH PROPOFOL N/A 04/22/2013   Procedure: COLONOSCOPY WITH PROPOFOL;  Surgeon: [name], MD;  [facility];  Service: Endoscopy;  Laterality: N/A;  . CORONARY ARTERY BYPASS GRAFT  2005   X 4  . DG ANGIO AV SHUNT*L*     right and left upper arms  . FASCIOTOMY  03/03/2012   Procedure: FASCIOTOMY;  Surgeon: [name], MD;  Location: [place];  Service: Orthopedics;  Laterality: Right;  FASCIOTOMY RIGHT SMALL FINGER  . FASCIOTOMY Left 08/17/2013   Procedure: FASCIOTOMY LEFT RING;  Surgeon: [name], MD;  Location: [place];  Service: Orthopedics;  Laterality: Left;  . INCISION AND  DRAINAGE ABSCESS Left 10/15/2015   Procedure: INCISION AND DRAINAGE ABSCESS;  Surgeon: [name], MD;  Location: [facility];  Service: General;  Laterality: Left;  . KIDNEY TRANSPLANT  09/13/2010   cadaver--at [facility]  . RIGHT HEART CATH N/A 11/15/2016   Procedure: RIGHT HEART CATH;  Surgeon: [name], MD;  Location: [place] [facility];  Service: Cardiovascular;  Laterality: N/A;  . TYMPANIC MEMBRANE REPAIR  1/12   left  . VASECTOMY      Family History  Problem Relation Age of Onset  . Heart disease Father   . Kidney failure Father   . Kidney disease Father   . Diabetes Maternal Grandmother   . Breast cancer Maternal Grandmother   . Valvular heart disease Mother   . Liver cancer Paternal Uncle   . Liver cancer Paternal Grandmother   . Prostate cancer Neg Hx     Social History   Socioeconomic History  . Marital status: Married    Spouse name: Not on file  . Number of children: 2  . Years of education: Not on file  . Highest education level: Not on file  Occupational History  . Occupation: Academic librarian: unemployed    Comment: disabled due to kidney failure  Social Needs  . Financial resource strain: Not on file  . Food insecurity    Worry: Not on file    Inability: Not on file  . Transportation needs    Medical: Not on file    Non-medical: Not on file  Tobacco Use  . Smoking status: Former Smoker    Types: Cigars    Quit date: 09/02/1994    Years since quitting: 24.3  . Smokeless tobacco: Never Used  Substance and Sexual Activity  . Alcohol use: Yes    Alcohol/week: 0.0 - 1.0 standard drinks    Comment: occasional- 3 in the past year  . Drug use: No  . Sexual activity: Not on file  Lifestyle  . Physical activity    Days per week: Not on file    Minutes per session: Not on file  . Stress: Not on file  Relationships  . Social Herbalist on phone: Not on file    Gets together: Not on file    Attends religious service:  Not on file    Active member of club or organization: Not on file    Attends meetings of clubs or organizations: Not on file    Relationship status: Not on file  . Intimate partner violence    Fear of current or ex partner: Not on file    Emotionally abused: Not on file    Physically abused: Not on file    Forced sexual activity: Not on file  Other Topics Concern  . Not on file  Social History Narrative   In school for culinary arts--- will finish 12/16      Has living will   Wife is health care POA   Would accept resuscitation attempts   Hasn't considered tube feedings   Review of Systems  No fever Not sick No change in mild foot edema     Objective:   Physical Exam  Musculoskeletal:     Comments: Contractures at both 5th finger PIP---90 degrees on left, and mild on right Sig thickening/fibrosis in tendon sheaths  Skin:  Apparent swollen, tender superficial vein in medial mid left thigh No cords or sig tenderness otherwise Slight calf swelling--- right > left           Assessment & Plan:

## 2018-12-23 NOTE — Assessment & Plan Note (Signed)
Fairly severe and increasing pain I suspect cortisone injections will not be effective Will set up with hand surgeon

## 2018-12-23 NOTE — Assessment & Plan Note (Signed)
Discussed heat Wears support socks but doesn't think he would tolerate hose

## 2018-12-26 DIAGNOSIS — E662 Morbid (severe) obesity with alveolar hypoventilation: Secondary | ICD-10-CM | POA: Diagnosis not present

## 2018-12-26 DIAGNOSIS — G4733 Obstructive sleep apnea (adult) (pediatric): Secondary | ICD-10-CM | POA: Diagnosis not present

## 2018-12-31 ENCOUNTER — Other Ambulatory Visit: Payer: Self-pay | Admitting: Cardiovascular Disease

## 2018-12-31 NOTE — Telephone Encounter (Signed)
Eliquis 34m refill request received; pt is 58yrs old, weight-123.4kg, Crea-1.23 on 11/06/2018, Diagnosis-Afib, and last seen by Dr. AFletcher Anonon 12/10/2018. Dose is appropriate based on dosing criteria. Will send in refill to requested pharmacy.

## 2018-12-31 NOTE — Telephone Encounter (Signed)
Please review for refill. Thanks!

## 2019-01-01 ENCOUNTER — Other Ambulatory Visit: Payer: Self-pay | Admitting: Family Medicine

## 2019-01-01 DIAGNOSIS — J309 Allergic rhinitis, unspecified: Secondary | ICD-10-CM

## 2019-01-04 ENCOUNTER — Telehealth: Payer: Self-pay | Admitting: Cardiovascular Disease

## 2019-01-04 NOTE — Telephone Encounter (Signed)
Patient calling Would like to know if we have and if we can provide patient with an Eliquis discount card Please call to discuss

## 2019-01-04 NOTE — Telephone Encounter (Signed)
Spoke with the patient. Adv him that I did locate an Eliquis $10 co-pay card. It will be left at the front desk for pick. Patient verbalized understanding and voiced appreciation for the assistance.

## 2019-01-05 DIAGNOSIS — M720 Palmar fascial fibromatosis [Dupuytren]: Secondary | ICD-10-CM | POA: Diagnosis not present

## 2019-01-15 ENCOUNTER — Other Ambulatory Visit: Payer: Self-pay

## 2019-01-15 ENCOUNTER — Encounter: Payer: Self-pay | Admitting: Intensive Care

## 2019-01-15 ENCOUNTER — Inpatient Hospital Stay
Admission: EM | Admit: 2019-01-15 | Discharge: 2019-01-16 | DRG: 313 | Disposition: A | Discharge status: Home / Self Care | Payer: 59 | Attending: Internal Medicine | Admitting: Internal Medicine

## 2019-01-15 ENCOUNTER — Emergency Department: Payer: 59

## 2019-01-15 DIAGNOSIS — Z8249 Family history of ischemic heart disease and other diseases of the circulatory system: Secondary | ICD-10-CM

## 2019-01-15 DIAGNOSIS — I2510 Atherosclerotic heart disease of native coronary artery without angina pectoris: Secondary | ICD-10-CM | POA: Diagnosis not present

## 2019-01-15 DIAGNOSIS — E669 Obesity, unspecified: Secondary | ICD-10-CM | POA: Diagnosis not present

## 2019-01-15 DIAGNOSIS — I872 Venous insufficiency (chronic) (peripheral): Secondary | ICD-10-CM | POA: Diagnosis present

## 2019-01-15 DIAGNOSIS — I878 Other specified disorders of veins: Secondary | ICD-10-CM | POA: Diagnosis present

## 2019-01-15 DIAGNOSIS — I700 Atherosclerosis of aorta: Secondary | ICD-10-CM | POA: Diagnosis not present

## 2019-01-15 DIAGNOSIS — Z9884 Bariatric surgery status: Secondary | ICD-10-CM

## 2019-01-15 DIAGNOSIS — Z940 Kidney transplant status: Secondary | ICD-10-CM | POA: Diagnosis not present

## 2019-01-15 DIAGNOSIS — J302 Other seasonal allergic rhinitis: Secondary | ICD-10-CM | POA: Diagnosis present

## 2019-01-15 DIAGNOSIS — Z886 Allergy status to analgesic agent status: Secondary | ICD-10-CM

## 2019-01-15 DIAGNOSIS — Z560 Unemployment, unspecified: Secondary | ICD-10-CM

## 2019-01-15 DIAGNOSIS — I4821 Permanent atrial fibrillation: Secondary | ICD-10-CM | POA: Diagnosis not present

## 2019-01-15 DIAGNOSIS — Z9981 Dependence on supplemental oxygen: Secondary | ICD-10-CM

## 2019-01-15 DIAGNOSIS — I482 Chronic atrial fibrillation, unspecified: Secondary | ICD-10-CM | POA: Diagnosis not present

## 2019-01-15 DIAGNOSIS — N179 Acute kidney failure, unspecified: Secondary | ICD-10-CM | POA: Diagnosis not present

## 2019-01-15 DIAGNOSIS — E876 Hypokalemia: Secondary | ICD-10-CM | POA: Diagnosis present

## 2019-01-15 DIAGNOSIS — E785 Hyperlipidemia, unspecified: Secondary | ICD-10-CM | POA: Diagnosis not present

## 2019-01-15 DIAGNOSIS — Z833 Family history of diabetes mellitus: Secondary | ICD-10-CM

## 2019-01-15 DIAGNOSIS — I5022 Chronic systolic (congestive) heart failure: Secondary | ICD-10-CM | POA: Diagnosis present

## 2019-01-15 DIAGNOSIS — Z79899 Other long term (current) drug therapy: Secondary | ICD-10-CM

## 2019-01-15 DIAGNOSIS — M109 Gout, unspecified: Secondary | ICD-10-CM | POA: Diagnosis present

## 2019-01-15 DIAGNOSIS — Z7951 Long term (current) use of inhaled steroids: Secondary | ICD-10-CM

## 2019-01-15 DIAGNOSIS — F329 Major depressive disorder, single episode, unspecified: Secondary | ICD-10-CM | POA: Diagnosis not present

## 2019-01-15 DIAGNOSIS — Z951 Presence of aortocoronary bypass graft: Secondary | ICD-10-CM

## 2019-01-15 DIAGNOSIS — J4540 Moderate persistent asthma, uncomplicated: Secondary | ICD-10-CM | POA: Diagnosis present

## 2019-01-15 DIAGNOSIS — Z794 Long term (current) use of insulin: Secondary | ICD-10-CM

## 2019-01-15 DIAGNOSIS — Z7901 Long term (current) use of anticoagulants: Secondary | ICD-10-CM

## 2019-01-15 DIAGNOSIS — G4733 Obstructive sleep apnea (adult) (pediatric): Secondary | ICD-10-CM | POA: Diagnosis not present

## 2019-01-15 DIAGNOSIS — I272 Pulmonary hypertension, unspecified: Secondary | ICD-10-CM | POA: Diagnosis present

## 2019-01-15 DIAGNOSIS — K219 Gastro-esophageal reflux disease without esophagitis: Secondary | ICD-10-CM | POA: Diagnosis not present

## 2019-01-15 DIAGNOSIS — I4819 Other persistent atrial fibrillation: Secondary | ICD-10-CM | POA: Diagnosis not present

## 2019-01-15 DIAGNOSIS — I110 Hypertensive heart disease with heart failure: Secondary | ICD-10-CM | POA: Diagnosis present

## 2019-01-15 DIAGNOSIS — R0789 Other chest pain: Secondary | ICD-10-CM | POA: Diagnosis not present

## 2019-01-15 DIAGNOSIS — Z20828 Contact with and (suspected) exposure to other viral communicable diseases: Secondary | ICD-10-CM | POA: Diagnosis not present

## 2019-01-15 DIAGNOSIS — E1165 Type 2 diabetes mellitus with hyperglycemia: Secondary | ICD-10-CM | POA: Diagnosis not present

## 2019-01-15 DIAGNOSIS — R0602 Shortness of breath: Secondary | ICD-10-CM | POA: Diagnosis not present

## 2019-01-15 DIAGNOSIS — R739 Hyperglycemia, unspecified: Secondary | ICD-10-CM

## 2019-01-15 DIAGNOSIS — I5023 Acute on chronic systolic (congestive) heart failure: Secondary | ICD-10-CM | POA: Diagnosis not present

## 2019-01-15 DIAGNOSIS — Z885 Allergy status to narcotic agent status: Secondary | ICD-10-CM

## 2019-01-15 DIAGNOSIS — Z841 Family history of disorders of kidney and ureter: Secondary | ICD-10-CM

## 2019-01-15 DIAGNOSIS — R079 Chest pain, unspecified: Secondary | ICD-10-CM | POA: Diagnosis not present

## 2019-01-15 DIAGNOSIS — Z6838 Body mass index (BMI) 38.0-38.9, adult: Secondary | ICD-10-CM

## 2019-01-15 DIAGNOSIS — Z87891 Personal history of nicotine dependence: Secondary | ICD-10-CM

## 2019-01-15 LAB — BASIC METABOLIC PANEL
Anion gap: 12 (ref 5–15)
BUN: 47 mg/dL — ABNORMAL HIGH (ref 6–20)
CO2: 30 mmol/L (ref 22–32)
Calcium: 9.2 mg/dL (ref 8.9–10.3)
Chloride: 90 mmol/L — ABNORMAL LOW (ref 98–111)
Creatinine, Ser: 1.53 mg/dL — ABNORMAL HIGH (ref 0.61–1.24)
GFR calc Af Amer: 57 mL/min — ABNORMAL LOW (ref >60)
GFR calc non Af Amer: 49 mL/min — ABNORMAL LOW (ref >60)
Glucose, Bld: 520 mg/dL (ref 70–99)
Potassium: 3.4 mmol/L — ABNORMAL LOW (ref 3.5–5.1)
Sodium: 132 mmol/L — ABNORMAL LOW (ref 135–145)

## 2019-01-15 LAB — PROTIME-INR
INR: 1.5 — ABNORMAL HIGH (ref 0.8–1.2)
Prothrombin Time: 17.9 seconds — ABNORMAL HIGH (ref 11.4–15.2)

## 2019-01-15 LAB — CBC
HCT: 35.3 % — ABNORMAL LOW (ref 39.0–52.0)
Hemoglobin: 10.8 g/dL — ABNORMAL LOW (ref 13.0–17.0)
MCH: 22.8 pg — ABNORMAL LOW (ref 26.0–34.0)
MCHC: 30.6 g/dL (ref 30.0–36.0)
MCV: 74.6 fL — ABNORMAL LOW (ref 80.0–100.0)
Platelets: 137 10*3/uL — ABNORMAL LOW (ref 150–400)
RBC: 4.73 MIL/uL (ref 4.22–5.81)
RDW: 15.8 % — ABNORMAL HIGH (ref 11.5–15.5)
WBC: 5.4 10*3/uL (ref 4.0–10.5)
nRBC: 0 % (ref 0.0–0.2)

## 2019-01-15 LAB — TROPONIN I (HIGH SENSITIVITY)
Troponin I (High Sensitivity): 28 ng/L — ABNORMAL HIGH (ref <18)
Troponin I (High Sensitivity): 26 ng/L — ABNORMAL HIGH (ref <18)

## 2019-01-15 LAB — GLUCOSE, CAPILLARY: Glucose-Capillary: 246 mg/dL — ABNORMAL HIGH (ref 70–99)

## 2019-01-15 MED ORDER — NITROGLYCERIN 0.4 MG SL SUBL: 0.4000 mg | SUBLINGUAL_TABLET | SUBLINGUAL | Status: DC | PRN

## 2019-01-15 MED ORDER — INSULIN GLARGINE 100 UNIT/ML ~~LOC~~ SOLN
15.0000 [IU] | Freq: Every day | SUBCUTANEOUS | Status: DC
  Administered 2019-01-15: 15 [IU] via SUBCUTANEOUS
  Filled 2019-01-15 (×2): qty 0.15

## 2019-01-15 MED ORDER — INSULIN ASPART 100 UNIT/ML ~~LOC~~ SOLN
0.0000 [IU] | Freq: Every day | SUBCUTANEOUS | Status: DC
  Administered 2019-01-15: 2 [IU] via SUBCUTANEOUS
  Filled 2019-01-15: qty 1

## 2019-01-15 MED ORDER — INSULIN ASPART 100 UNIT/ML ~~LOC~~ SOLN
5.0000 [IU] | Freq: Once | SUBCUTANEOUS | Status: AC
  Administered 2019-01-15: 5 [IU] via INTRAVENOUS
  Filled 2019-01-15: qty 1

## 2019-01-15 MED ORDER — ACETAMINOPHEN 325 MG PO TABS: 650.0000 mg | ORAL_TABLET | ORAL | Status: DC | PRN

## 2019-01-15 MED ORDER — CLOPIDOGREL BISULFATE 75 MG PO TABS: 75.0000 mg | ORAL_TABLET | Freq: Every day | ORAL | Status: DC

## 2019-01-15 MED ORDER — ONDANSETRON HCL 4 MG/2ML IJ SOLN: 4.0000 mg | Freq: Four times a day (QID) | INTRAMUSCULAR | Status: DC | PRN

## 2019-01-15 MED ORDER — INSULIN ASPART 100 UNIT/ML ~~LOC~~ SOLN
0.0000 [IU] | Freq: Three times a day (TID) | SUBCUTANEOUS | Status: DC
  Administered 2019-01-16: 7 [IU] via SUBCUTANEOUS
  Administered 2019-01-16: 3 [IU] via SUBCUTANEOUS
  Filled 2019-01-15 (×2): qty 1

## 2019-01-15 NOTE — ED Notes (Signed)
Date and time results received: 01/15/19 1735   Test:glucose Critical Value: [facility] Received? Or Actions Taken?: Charge made aware, waiting on room.

## 2019-01-15 NOTE — ED Notes (Signed)
Report from offgoing RN.

## 2019-01-15 NOTE — H&P (Addendum)
[name] [place] NAME: [name]    MR#:  [id number]  DATE OF BIRTH:  09-25-1960  DATE OF ADMISSION:  01/15/2019  PRIMARY CARE PHYSICIAN: [name], MD   REQUESTING/REFERRING PHYSICIAN: Blake Divine, MD  CHIEF COMPLAINT:   Chief Complaint  Patient presents with  . Chest Pain    HISTORY OF PRESENT ILLNESS:  58 y.o. male with pertinent past medical history of renal transplant, diabetes mellitus, CAD s/p 4V CABG, hypertension, OSA on CPAP, CKD, atrial fibrillation, hyperlipidemia, chronic diastolic heart failure senting to the ED with chief complaints of chest pain.  Patient report onset of chest pain since 3:30 PM this evening, he reports that he was walking to his car when he had sudden onset of pain in the center of his chest.  Describes symptoms of " my chest giving him" associated with shortness of breath and nausea.  Denies diaphoresis, vomiting, pain radiating to any extremity or his neck, fevers or chills, abdominal pain, diarrhea or any other associated symptoms.  Patient states pain persisted for about 30 minutes before resolving on its own.  On arrival to the ED, he was afebrile with blood pressure 148/74 mm Hg and pulse rate 75 beats/min. There were no focal neurological deficits; he was alert and oriented x4, and he did not complain of any chest pain.  This revealed sodium 132, potassium 3.4, glucose 520, BUN 47, creatinine 1.53, globin 10.8, platelets 137, showed troponin 28, repeat 26, PT 17.9 INR 1.5.  COVID-19 pending.  Chest x-ray showed pulmonary vascular congestion. Hospitalist ask to admit for further evaluation.  PAST MEDICAL HISTORY:   Past Medical History:  Diagnosis Date  . Allergy    seasonal  . Amnestic MCI (mild cognitive impairment with memory loss)   . Asthma   . Benign neoplasm of colon   . CAD (coronary artery disease) 2005   a.  Status post four-vessel CABG in 2005  . Chronic atrial  fibrillation ([place])    a. CHADS2VASc 4 (CHF, HTN, DM, vascular disease); b. Eliquis  . Chronic combined systolic and diastolic CHF (congestive heart failure) ([place])    a.  TTE 7/18: EF 45-50%, moderately dilated LA, RVSF normal, PASP normal  . Chronic venous stasis dermatitis of both lower extremities   . Cytomegaloviral disease ([place]) 2017  . Depression   . Diabetes mellitus   . Diverticulosis   . Dyspnea   . Erectile dysfunction   . ESRD (end stage renal disease) ([place]) 2005   a. HD 2004-2012; b. S/p renal transplant in 2012  . GERD (gastroesophageal reflux disease)   . Gout   . Hyperlipidemia    low since renal failure  . Hypertension   . Kidney transplant status, cadaveric 2012  . Obesity   . Pneumonia   . Purpura ([place])   . Sleep apnea    bipap with oxygen 2 l  . Trigger finger   . Ulcer 05/2016   Left shin  . Wears glasses     PAST SURGICAL HISTORY:   Past Surgical History:  Procedure Laterality Date  . BARIATRIC SURGERY    . CARDIAC CATHETERIZATION     [facility]  . CATARACT EXTRACTION W/PHACO Right 10/29/2017   Procedure: CATARACT EXTRACTION PHACO AND INTRAOCULAR LENS PLACEMENT ([place])  RIGHT DIABETIC;  Surgeon: [name], MD;  Location: [facility];  Service: Ophthalmology;  Laterality: Right;  Diabetic - insulin  . CATARACT EXTRACTION W/PHACO Left 11/18/2017  Procedure: CATARACT EXTRACTION PHACO AND INTRAOCULAR LENS PLACEMENT ([place]) LEFT IVA/TOPICAL;  Surgeon: [name], MD;  Location: [facility];  Service: Ophthalmology;  Laterality: Left;  DIABETES - insulin sleep apnea  . COLONOSCOPY WITH PROPOFOL N/A 04/22/2013   Procedure: COLONOSCOPY WITH PROPOFOL;  Surgeon: [name], MD;  Location: [facility];  Service: Endoscopy;  Laterality: N/A;  . CORONARY ARTERY BYPASS GRAFT  2005   X 4  . DG ANGIO AV SHUNT*L*     right and left upper arms  . FASCIOTOMY  03/03/2012   Procedure: FASCIOTOMY;  Surgeon: [name], MD;  Location:  [facility];  Service: Orthopedics;  Laterality: Right;  FASCIOTOMY RIGHT SMALL FINGER  . FASCIOTOMY Left 08/17/2013   Procedure: FASCIOTOMY LEFT RING;  Surgeon: [name], MD;  Location: [facility];  Service: Orthopedics;  Laterality: Left;  . INCISION AND DRAINAGE ABSCESS Left 10/15/2015   Procedure: INCISION AND DRAINAGE ABSCESS;  Surgeon: [name], MD;  Location: [facility];  Service: General;  Laterality: Left;  . KIDNEY TRANSPLANT  09/13/2010   cadaver--at [facility]  . RIGHT HEART CATH N/A 11/15/2016   Procedure: RIGHT HEART CATH;  Surgeon: [name], MD;  Location: [facility];  Service: Cardiovascular;  Laterality: N/A;  . TYMPANIC MEMBRANE REPAIR  1/12   left  . VASECTOMY      SOCIAL HISTORY:   Social History   Tobacco Use  . Smoking status: Former Smoker    Types: Cigars    Quit date: 09/02/1994    Years since quitting: 24.3  . Smokeless tobacco: Never Used  Substance Use Topics  . Alcohol use: Yes    Alcohol/week: 0.0 - 1.0 standard drinks    Comment: occasional- 3 in the past year    FAMILY HISTORY:   Family History  Problem Relation Age of Onset  . Heart disease Father   . Kidney failure Father   . Kidney disease Father   . Diabetes Maternal Grandmother   . Breast cancer Maternal Grandmother   . Valvular heart disease Mother   . Liver cancer Paternal Uncle   . Liver cancer Paternal Grandmother   . Prostate cancer Neg Hx     DRUG ALLERGIES:   Allergies  Allergen Reactions  . Morphine Itching  . Ibuprofen Other (See Comments)    Due to kidney transplant    REVIEW OF SYSTEMS:   Review of Systems  Constitutional: Negative for chills, fever, malaise/fatigue and weight loss.  HENT: Negative for congestion, hearing loss and sore throat.   Eyes: Negative for blurred vision and double vision.  Respiratory: Positive for shortness of breath. Negative for cough and wheezing.   Cardiovascular: Positive for chest  pain. Negative for palpitations and orthopnea.  Gastrointestinal: Positive for nausea. Negative for abdominal pain, diarrhea and vomiting.  Genitourinary: Negative for dysuria and urgency.  Musculoskeletal: Negative for myalgias.  Skin: Negative for rash.  Neurological: Negative for dizziness, sensory change, speech change, focal weakness and headaches.  Psychiatric/Behavioral: Negative for depression.    MEDICATIONS AT HOME:   Prior to Admission medications   Medication Sig Start Date End Date Taking? Authorizing Provider  albuterol (PROVENTIL HFA;VENTOLIN HFA) 108 (90 Base) MCG/ACT inhaler Inhale 2 puffs every 6 (six) hours as needed into the lungs for wheezing or shortness of breath. 02/10/17   [name], MD  amLODipine (NORVASC) 5 MG tablet Take 1 tablet (5 mg total) by mouth daily. 12/17/18  [name], MD  beclomethasone (QVAR REDIHALER) 80 MCG/ACT inhaler Inhale 2 puffs into the lungs 2 (two) times daily. Rinse mouth after use. 06/15/18 06/15/19  Laverle Hobby, MD  buPROPion [facility] SR) 150 MG 12 hr tablet TAKE 1 TABLET BY MOUTH TWICE DAILY 09/03/18   [name] I, MD  colchicine 0.6 MG tablet Take 1 tablet (0.6 mg total) by mouth 2 (two) times daily as needed. 03/30/18   [name], MD  ELIQUIS 5 MG TABS tablet TAKE 1 TABLET BY MOUTH TWICE DAILY 12/31/18   [name], MD  FARXIGA 10 MG TABS tablet Take 1 tablet by mouth daily. 10/14/18   [provider]  fluticasone (FLONASE) 50 MCG/ACT nasal spray PLACE 2 SPRAYS INTO BOTH NOSTRILS DAILY. 01/01/19   [name], MD  furosemide (LASIX) 40 MG tablet Take 40 mg by mouth. 61m in the AM and 40 mg in the PM    [provider]  gabapentin (NEURONTIN) 300 MG capsule Take 300 mg by mouth at bedtime.  12/01/18 12/01/19  [provider]  HUMALOG KWIKPEN 100 UNIT/ML KwikPen Inject 5-10 Units into the skin 3 (three) times daily.  09/01/18   [provider]  insulin aspart  (NOVOLOG FLEXPEN) 100 UNIT/ML FlexPen Inject into the skin 3 (three) times daily with meals.     [provider]  insulin glargine (LANTUS) 100 unit/mL SOPN Inject 0.1 mLs (10 Units total) into the skin at bedtime. Currently using 15 units Patient taking differently: Inject 30 Units into the skin at bedtime.  11/16/16   [name] MD  lisinopril (ZESTRIL) 40 MG tablet TAKE 1 TABLET BY MOUTH ONCE DAILY 08/19/18   HDarylene PriceA, FNP  magnesium 30 MG tablet Take 30 mg by mouth daily.    [provider]  metolazone (ZAROXOLYN) 5 MG tablet TAKE 1 TABLET BY MOUTH AS NEEDED 12/03/17   HDarylene PriceA, FNP  montelukast (SINGULAIR) 10 MG tablet TAKE 1 TABLET BY MOUTH AT BEDTIME 06/01/18   RLaverle Hobby MD  Multiple Vitamin (MULTIVITAMIN WITH MINERALS) TABS tablet Take 1 tablet by mouth daily.    [provider]  mycophenolate (MYFORTIC) 180 MG EC tablet Take 360 mg by mouth 2 (two) times daily.     [provider]  omeprazole (PRILOSEC) 20 MG capsule TAKE 1 CAPSULE BY MOUTH ONCE DAILY 02/21/16   LViviana SimplerI, MD  potassium chloride (MICRO-K) 10 MEQ CR capsule TAKE 2 CAPSULES (20 MEQ TOTAL) BY MOUTH 2 (TWO) TIMES DAILY. 05/18/18   HAlisa Graff FNP  rosuvastatin (CRESTOR) 10 MG tablet TAKE 1 TABLET BY MOUTH DAILY 06/09/18   AWellington Hampshire MD  sildenafil (VIAGRA) 100 MG tablet TAKE 0.5-1 TABLETS (50-100 MG TOTAL) BY MOUTH DAILY AS NEEDED FOR ERECTILE DYSFUNCTION. 01/28/18   LVenia Carbon MD  spironolactone (ALDACTONE) 25 MG tablet Take 1 tablet (25 mg total) by mouth daily. 11/16/16 12/10/18  [name] MD  tacrolimus (PROGRAF) 0.5 MG capsule Take 1 mg by mouth 2 (two) times daily.    [provider]  tadalafil (CIALIS) 20 MG tablet Take 0.5-1 tablets (10-20 mg total) by mouth every other day as needed for erectile dysfunction. 12/23/18   LVenia Carbon MD  tamsulosin (FLOMAX) 0.4 MG CAPS capsule Take 0.4 mg by mouth daily.     [provider]      VITAL SIGNS:  Blood pressure 129/67, pulse 67, temperature 98.4 F (36.9 C), temperature source Oral,  resp. rate 18, height 5' 10.5" (1.791 m), weight 118.4 kg, SpO2 96 %.  PHYSICAL EXAMINATION:   Physical Exam  GENERAL:  58 y.o.-year-old patient lying in the bed with no acute distress.  EYES: Pupils equal, round, reactive to light and accommodation. No scleral icterus. Extraocular muscles intact.  HEENT: Head atraumatic, normocephalic. Oropharynx and nasopharynx clear.  NECK:  Supple, no jugular venous distention. No thyroid enlargement, no tenderness.  LUNGS: Normal breath sounds bilaterally, no wheezing, rales,rhonchi or crepitation. No use of accessory muscles of respiration.  CARDIOVASCULAR: S1, S2 normal. No murmurs, rubs, or gallops.  ABDOMEN: Soft, nontender, nondistended. Bowel sounds present. No organomegaly or mass.  EXTREMITIES: No pedal edema, cyanosis, or clubbing.  NEUROLOGIC: Cranial nerves II through XII are intact. Muscle strength 5/5 in all extremities. Sensation intact. Gait not checked.  PSYCHIATRIC: The patient is alert and oriented x 3.  SKIN: No obvious rash, lesion, or ulcer.   DATA REVIEWED:  LABORATORY PANEL:   CBC Recent Labs  Lab 01/15/19 1645  WBC 5.4  HGB 10.8*  HCT 35.3*  PLT 137*   ------------------------------------------------------------------------------------------------------------------  Chemistries  Recent Labs  Lab 01/15/19 1645  NA 132*  K 3.4*  CL 90*  CO2 30  GLUCOSE 520*  BUN 47*  CREATININE 1.53*  CALCIUM 9.2   ------------------------------------------------------------------------------------------------------------------  Cardiac Enzymes No results for input(s): TROPONINI in the last 168 hours. ------------------------------------------------------------------------------------------------------------------  RADIOLOGY:  Dg Chest 2 View  Result Date: 01/15/2019 CLINICAL DATA:  Hervey Ard,  tight chest pain and shortness of breath. EXAM: CHEST - 2 VIEW COMPARISON:  11/05/2018 FINDINGS: Post median sternotomy with stable appearance of cardiomediastinal contours. Lungs are clear with pulmonary vascular congestion. Stable calcified density projecting over right mediastinal contour. IMPRESSION: Pulmonary vascular congestion. No other interval changes. Electronically Signed   By: Zetta Bills M.D.   On: 01/15/2019 17:37    EKG:  EKG: normal EKG, normal sinus rhythm, unchanged from previous tracings, atrial fibrillation, rate 71, LBBB.  IMPRESSION AND PLAN:   58 y.o. male with pertinent past medical history of renal transplant, diabetes mellitus, CAD s/p 4V CABG, hypertension, OSA on CPAP, CKD, atrial fibrillation, hyperlipidemia, chronic diastolic heart failure presenting to the ED with chief complaints of chest pain.  1. Atypical chest pain -no dynamic EKG changes with abnormal troponin Hx CAD s/p 4V CABG - Admit to telemetry unit - Trend troponin - Check Echocardiogram - CT chest without contrast - Hold anticoagulation at this time as patient is pain-free and troponin trending down - NTG + morphine PRN chest pain - Cardiology consult, and follows with [name] on outpatient basis  2. Chronic atrial fibrillation: Rate controlled without medication - Continue anticoagulation with Eliquis  3. AKI superimposed on CKD -BUN/creatinine slightly elevated above baseline - s/p kidney transplant on tacrolimus - Hold nephrotoxins - Continue to monitor renal function  4. Chronic systolic heart failure - Recent EF normal but with significant diastolic heart failure and severe pulmonary hypertension - Continue home Lasix - Check BNP - Echocardiogram pending - Low salt diet  - Check daily weight - Strict I&Os - CHF Teaching - Cardiology Consult  4. HLD  - Rosuvastatin 71m PO qhs  5. HTN  + Goal BP <130/80 -Hold lisinopril in the setting of mild AKI  6. Diabetes mellitus  type 2 - A1c 10.4 - Continue home Lantus - SSI  7.OSA on CPAP   DVT prophylaxis patient therapeutically anticoagulation on Eliquis   All the records are reviewed and case discussed  with ED provider. Management plans discussed with the patient, family and they are in agreement.  CODE STATUS: FULL  TOTAL TIME TAKING CARE OF THIS PATIENT: 50 minutes.    on 01/15/2019 at 8:09 PM   Rufina Falco, DNP, FNP-BC [facility] Nurse Practitioner Between 7am to 6pm - Pager [phone]  After 6pm go to [email or web address] - password [email or web address] [facility]  Office  [phone]  CC: Primary care physician; [name], MD

## 2019-01-15 NOTE — ED Notes (Signed)
Ice chips provided.

## 2019-01-15 NOTE — ED Notes (Signed)
ED TO INPATIENT HANDOFF REPORT  ED Nurse Name and Phone #: Kiki Bivens [phone]   S Name/Age/Gender [name] 58 y.o. male Room/Bed: ED16A/ED16A  Code Status   Code Status: Full Code  Home/SNF/Other Home Patient oriented to: self, place, time and situation Is this baseline? Yes   Triage Complete: Triage complete  Chief Complaint chest pain  Triage Note Pt c/o sharp/tight chest pain with SOB that started around 3:30pm. Takes eliquis daily   Allergies Allergies  Allergen Reactions  . Morphine Itching  . Ibuprofen Other (See Comments)    Due to kidney transplant    Level of Care/Admitting Diagnosis ED Disposition    ED Disposition Condition [place]: [name] [[id number]]  Level of Care: Telemetry [5]  Covid Evaluation: Asymptomatic Screening Protocol (No Symptoms)  Diagnosis: Chest pain with high risk of acute coronary syndrome [176160]  Admitting Physician: Lang Snow [[id number]]  Attending Physician: [name] [[id number]]  Estimated length of stay: past midnight tomorrow  Certification:: I certify this patient will need inpatient services for at least 2 midnights  PT Class (Do Not Modify): Inpatient [101]  PT Acc Code (Do Not Modify): Private [1]       B Medical/Surgery History Past Medical History:  Diagnosis Date  . Allergy    seasonal  . Amnestic MCI (mild cognitive impairment with memory loss)   . Asthma   . Benign neoplasm of colon   . CAD (coronary artery disease) 2005   a.  Status post four-vessel CABG in 2005  . Chronic atrial fibrillation ([place])    a. CHADS2VASc 4 (CHF, HTN, DM, vascular disease); b. Eliquis  . Chronic combined systolic and diastolic CHF (congestive heart failure) ([place])    a.  TTE 7/18: EF 45-50%, moderately dilated LA, RVSF normal, PASP normal  . Chronic venous stasis dermatitis of both lower extremities   . Cytomegaloviral disease ([place]) 2017  . Depression   .  Diabetes mellitus   . Diverticulosis   . Dyspnea   . Erectile dysfunction   . ESRD (end stage renal disease) ([place]) 2005   a. HD 2004-2012; b. S/p renal transplant in 2012  . GERD (gastroesophageal reflux disease)   . Gout   . Hyperlipidemia    low since renal failure  . Hypertension   . Kidney transplant status, cadaveric 2012  . Obesity   . Pneumonia   . Purpura ([place])   . Sleep apnea    bipap with oxygen 2 l  . Trigger finger   . Ulcer 05/2016   Left shin  . Wears glasses    Past Surgical History:  Procedure Laterality Date  . BARIATRIC SURGERY    . CARDIAC CATHETERIZATION     [facility]  . CATARACT EXTRACTION W/PHACO Right 10/29/2017   Procedure: CATARACT EXTRACTION PHACO AND INTRAOCULAR LENS PLACEMENT ([place])  RIGHT DIABETIC;  Surgeon: [name], MD;  Location: [facility];  Service: Ophthalmology;  Laterality: Right;  Diabetic - insulin  . CATARACT EXTRACTION W/PHACO Left 11/18/2017   Procedure: CATARACT EXTRACTION PHACO AND INTRAOCULAR LENS PLACEMENT ([place]) LEFT IVA/TOPICAL;  Surgeon: [name], MD;  Location: [facility];  Service: Ophthalmology;  Laterality: Left;  DIABETES - insulin sleep apnea  . COLONOSCOPY WITH PROPOFOL N/A 04/22/2013   Procedure: COLONOSCOPY WITH PROPOFOL;  Surgeon: [name], MD;  Location: [facility];  Service: Endoscopy;  Laterality: N/A;  . CORONARY ARTERY BYPASS GRAFT  2005   X 4  .  DG ANGIO AV SHUNT*L*     right and left upper arms  . FASCIOTOMY  03/03/2012   Procedure: FASCIOTOMY;  Surgeon: [name], MD;  Location: [facility];  Service: Orthopedics;  Laterality: Right;  FASCIOTOMY RIGHT SMALL FINGER  . FASCIOTOMY Left 08/17/2013   Procedure: FASCIOTOMY LEFT RING;  Surgeon: [name], MD;  Location: [facility];  Service: Orthopedics;  Laterality: Left;  . INCISION AND DRAINAGE ABSCESS Left 10/15/2015   Procedure: INCISION AND DRAINAGE ABSCESS;  Surgeon: [name], MD;   Location: [facility];  Service: General;  Laterality: Left;  . KIDNEY TRANSPLANT  09/13/2010   cadaver--at [facility]  . RIGHT HEART CATH N/A 11/15/2016   Procedure: RIGHT HEART CATH;  Surgeon: [name], MD;  Location: [facility];  Service: Cardiovascular;  Laterality: N/A;  . TYMPANIC MEMBRANE REPAIR  1/12   left  . VASECTOMY       A IV Location/Drains/Wounds Patient Lines/Drains/Airways Status   Active Line/Drains/Airways    Name:   Placement date:   Placement time:   Site:   Days:   Peripheral IV 01/15/19 Right Forearm   01/15/19    1816    Forearm   less than 1   Fistula / Graft Left Upper arm Arteriovenous fistula   -    -    Upper arm      Fistula / Graft Left Upper arm Arteriovenous fistula   -    -    Upper arm      Fistula / Graft Left Upper arm Arteriovenous vein graft   11/06/18    -    Upper arm   70   Incision (Closed) 10/15/15 Leg Left   10/15/15    1116     1188   Incision (Closed) 10/29/17 Eye Right   10/29/17    0746     443   Incision (Closed) 11/18/17 Eye Left   11/18/17    0742     423   Wound / Incision (Open or Dehisced) 10/12/15 Incision - Open Leg Left;Lower   10/12/15    2038    Leg   1191          Intake/Output Last 24 hours No intake or output data in the 24 hours ending 01/15/19 2011  Labs/Imaging Results for orders placed or performed during the hospital encounter of 01/15/19 (from the past 48 hour(s))  Basic metabolic panel     Status: Abnormal   Collection Time: 01/15/19  4:45 PM  Result Value Ref Range   Sodium 132 (L) 135 - 145 mmol/L   Potassium 3.4 (L) 3.5 - 5.1 mmol/L   Chloride 90 (L) 98 - 111 mmol/L   CO2 30 22 - 32 mmol/L   Glucose, Bld 520 (HH) 70 - 99 mg/dL    Comment: CRITICAL RESULT CALLED TO, READ BACK BY AND VERIFIED WITH COLLYN GELYPSIE _0  01/15/19 MJU    BUN 47 (H) 6 - 20 mg/dL   Creatinine, Ser 1.53 (H) 0.61 - 1.24 mg/dL   Calcium 9.2 8.9 - 10.3 mg/dL   GFR calc non Af Amer 49 (L) >60 mL/min   GFR calc Af  Amer 57 (L) >60 mL/min   Anion gap 12 5 - 15    Comment: Performed at [facility], [address] [place], [place] [address]  CBC     Status: Abnormal   Collection Time: 01/15/19  4:45 PM  Result Value Ref Range   WBC 5.4 4.0 - 10.5 K/uL   RBC 4.73 4.22 - 5.81 MIL/uL   Hemoglobin 10.8 (L) 13.0 - 17.0 g/dL   HCT 35.3 (L) 39.0 - 52.0 %   MCV 74.6 (L) 80.0 - 100.0 fL   MCH 22.8 (L) 26.0 - 34.0 pg   MCHC 30.6 30.0 - 36.0 g/dL   RDW 15.8 (H) 11.5 - 15.5 %   Platelets 137 (L) 150 - 400 K/uL   nRBC 0.0 0.0 - 0.2 %    Comment: Performed at [facility], [place], [place] [address]  Troponin I (High Sensitivity)     Status: Abnormal   Collection Time: 01/15/19  4:45 PM  Result Value Ref Range   Troponin I (High Sensitivity) 28 (H) <18 ng/L    Comment: (NOTE) Elevated high sensitivity troponin I (hsTnI) values and significant  changes across serial measurements may suggest ACS but many other  chronic and acute conditions are known to elevate hsTnI results.  Refer to the "Links" section for chest pain algorithms and additional  guidance. Performed at [facility], [address] [place], [place] [address]   Protime-INR (order if Patient is taking Coumadin / Warfarin)     Status: Abnormal   Collection Time: 01/15/19  4:45 PM  Result Value Ref Range   Prothrombin Time 17.9 (H) 11.4 - 15.2 seconds   INR 1.5 (H) 0.8 - 1.2    Comment: (NOTE) INR goal varies based on device and disease states. Performed at [facility], [place], [place] [address]   Troponin I (High Sensitivity)     Status: Abnormal   Collection Time: 01/15/19  6:33 PM  Result Value Ref Range   Troponin I (High Sensitivity) 26 (H) <18 ng/L    Comment: (NOTE) Elevated high sensitivity troponin I (hsTnI) values and significant  changes across serial measurements may suggest ACS but many other  chronic and acute conditions are known to elevate hsTnI  results.  Refer to the "Links" section for chest pain algorithms and additional  guidance. Performed at [facility], [address] [place], [place] [address]    Dg Chest 2 View  Result Date: 01/15/2019 CLINICAL DATA:  Hervey Ard, tight chest pain and shortness of breath. EXAM: CHEST - 2 VIEW COMPARISON:  11/05/2018 FINDINGS: Post median sternotomy with stable appearance of cardiomediastinal contours. Lungs are clear with pulmonary vascular congestion. Stable calcified density projecting over right mediastinal contour. IMPRESSION: Pulmonary vascular congestion. No other interval changes. Electronically Signed   By: Zetta Bills M.D.   On: 01/15/2019 17:37    Pending Labs Unresulted Labs (From admission, onward)    Start     Ordered   01/15/19 2010  Hemoglobin A1c  Add-on,   AD    Comments: To assess prior glycemic control    01/15/19 2009   01/15/19 1910  SARS CORONAVIRUS 2 (TAT 6-24 HRS) Nasopharyngeal Nasopharyngeal Swab  (Asymptomatic/Tier 2 Patients Labs)  Once,   STAT    Question Answer Comment  Is this test for diagnosis or screening Screening   Symptomatic for COVID-19 as defined by CDC No   Hospitalized for COVID-19 No   Admitted to ICU for COVID-19 No   Previously tested for COVID-19 Yes   Resident in a congregate (group) care setting No   Employed in healthcare setting No      01/15/19 1909          Vitals/Pain  Today's Vitals   01/15/19 1642 01/15/19 1900 01/15/19 1930  BP: (!) 148/74 138/66 129/67  Pulse: 75 68 67  Resp: _0 Temp: 98.4 F (36.9 C)    TempSrc: Oral    SpO2: 96% 95% 96%  Weight: 118.4 kg    Height: 5' 10.5" (1.791 m)    PainSc: 8       Isolation Precautions No active isolations  Medications Medications  nitroGLYCERIN (NITROSTAT) SL tablet 0.4 mg (has no administration in time range)  acetaminophen (TYLENOL) tablet 650 mg (has no administration in time range)  ondansetron (ZOFRAN) injection 4 mg (has no administration  in time range)  clopidogrel (PLAVIX) tablet 75 mg (has no administration in time range)  insulin glargine (LANTUS) injection 15 Units (has no administration in time range)  insulin aspart (novoLOG) injection 0-9 Units (has no administration in time range)  insulin aspart (novoLOG) injection 0-5 Units (has no administration in time range)  insulin aspart (novoLOG) injection 5 Units (5 Units Intravenous Given 01/15/19 1920)    Mobility walks Low fall risk   Focused Assessments Cardiac Assessment Handoff:  Cardiac Rhythm: Normal sinus rhythm Lab Results  Component Value Date   CKTOTAL 79 11/11/2016   CKMB 2.2 05/10/2012   TROPONINI <0.03 11/14/2016   No results found for: DDIMER Does the Patient currently have chest pain? Yes     R Recommendations: See Admitting Provider Note  Report given to:   Additional Notes:

## 2019-01-15 NOTE — ED Triage Notes (Addendum)
Pt c/o sharp/tight chest pain with SOB that started around 3:30pm. Takes eliquis daily

## 2019-01-15 NOTE — ED Notes (Signed)
Pt provided with urinal

## 2019-01-15 NOTE — ED Notes (Signed)
Bed adjusted for comfort.

## 2019-01-15 NOTE — ED Provider Notes (Signed)
[facility] Emergency Department Provider Note   ____________________________________________   First MD Initiated Contact with Patient 01/15/19 1813     (approximate)  I have reviewed the triage vital signs and the nursing notes.   HISTORY  Chief Complaint Chest Pain    HPI [name] is a 58 y.o. male with past medical history of renal transplant, CAD status post CABG, chronic A. fib, CHF, and diabetes who presents to the ED complaining of chest pain.  Patient reports that he was walking to his car earlier this evening when he had sudden onset of pain in the center of his chest.  He describes this as "like my chest was caving in".  It persisted for about 30 minutes before resolving on its own.  It was associated with some shortness of breath, but he denies any nausea, vomiting, or diaphoresis.  He states he was feeling well earlier in the day with no recent fevers or cough.  He has not noticed any pain or swelling in his legs.  Pain in his chest as well as difficulty breathing have resolved upon his arrival to the ED.        Past Medical History:  Diagnosis Date   Allergy    seasonal   Amnestic MCI (mild cognitive impairment with memory loss)    Asthma    Benign neoplasm of colon    CAD (coronary artery disease) 2005   a.  Status post four-vessel CABG in 2005   Chronic atrial fibrillation ([place])    a. CHADS2VASc 4 (CHF, HTN, DM, vascular disease); b. Eliquis   Chronic combined systolic and diastolic CHF (congestive heart failure) ([place])    a.  TTE 7/18: EF 45-50%, moderately dilated LA, RVSF normal, PASP normal   Chronic venous stasis dermatitis of both lower extremities    Cytomegaloviral disease ([place]) 2017   Depression    Diabetes mellitus    Diverticulosis    Dyspnea    Erectile dysfunction    ESRD (end stage renal disease) ([place]) 2005   a. HD 2004-2012; b. S/p renal transplant in 2012   GERD (gastroesophageal reflux  disease)    Gout    Hyperlipidemia    low since renal failure   Hypertension    Kidney transplant status, cadaveric 2012   Obesity    Pneumonia    Purpura ([place])    Sleep apnea    bipap with oxygen 2 l   Trigger finger    Ulcer 05/2016   Left shin   Wears glasses     Patient Active Problem List   Diagnosis Date Noted   Chest pain with high risk of acute coronary syndrome 01/15/2019   Trigger finger, unspecified little finger 12/23/2018   Superficial thrombophlebitis 12/23/2018   Fever 11/10/2018   SOB (shortness of breath) 11/06/2018   CHF (congestive heart failure) ([place]) 11/05/2018   MDD (major depressive disorder), single episode, mild ([place]) 04/29/2018   Acute gout 03/30/2018   Cervical sprain, initial encounter 03/02/2018   Moderate persistent asthma 05/21/2017   Cough 02/10/2017   Chronic diastolic heart failure ([place]) 11/19/2016   Chronic venous insufficiency 06/05/2016   Chronic ulcer of left leg ([place]) 10/26/2015   Cytomegalovirus (CMV) viremia ([place]) 09/07/2015   Proteinuria 06/23/2015   Hypokalemia 10/13/2014   BPH with obstruction/lower urinary tract symptoms 09/25/2014   Hypogonadism in male 09/25/2014   Immunosuppressed status ([place]) 09/09/2014   Type 2 diabetes mellitus with hyperglycemia, with long-term current use of  insulin ([place]) 09/09/2014   Chronic atrial fibrillation ([place]) 07/18/2014   DM eye manif type II 10/27/2013   MDD (recurrent major depressive disorder) in remission ([place]) 10/04/2013   Benign neoplasm of colon 04/22/2013   Diverticulosis of colon (without mention of hemorrhage) 04/22/2013   History of renal transplant 03/01/2013   Asthma, persistent controlled 02/25/2013   Erectile dysfunction 10/20/2012   Obstructive sleep apnea 09/29/2012   Obesity hypoventilation syndrome ([place]) 03/31/2012   Routine general medical examination at a health care facility 11/12/2011   Type 2 diabetes, controlled, with  renal manifestation ([name]) 03/01/2011   Essential (primary) hypertension 12/17/2010   CAD, multiple vessel 12/17/2010   Hyperlipidemia    ESRD (end stage renal disease) [facility])     Past Surgical History:  Procedure Laterality Date   BARIATRIC SURGERY     CARDIAC CATHETERIZATION     [facility]   CATARACT EXTRACTION W/PHACO Right 10/29/2017   Procedure: CATARACT EXTRACTION PHACO AND INTRAOCULAR LENS PLACEMENT ([place])  RIGHT DIABETIC;  Surgeon: [name], MD;  Location: [place];  Service: Ophthalmology;  Laterality: Right;  Diabetic - insulin   CATARACT EXTRACTION W/PHACO Left 11/18/2017   Procedure: CATARACT EXTRACTION PHACO AND INTRAOCULAR LENS PLACEMENT ([place]) LEFT IVA/TOPICAL;  Surgeon: [name], MD;  Location: [place];  Service: Ophthalmology;  Laterality: Left;  DIABETES - insulin sleep apnea   COLONOSCOPY WITH PROPOFOL N/A 04/22/2013   Procedure: COLONOSCOPY WITH PROPOFOL;  Surgeon: [name], MD;  Location: [facility];  Service: Endoscopy;  Laterality: N/A;   CORONARY ARTERY BYPASS GRAFT  2005   X 4   DG ANGIO AV SHUNT*L*     right and left upper arms   FASCIOTOMY  03/03/2012   Procedure: FASCIOTOMY;  Surgeon: [name], MD;  Location: [place];  Service: Orthopedics;  Laterality: Right;  FASCIOTOMY RIGHT SMALL FINGER   FASCIOTOMY Left 08/17/2013   Procedure: FASCIOTOMY LEFT RING;  Surgeon: [name], MD;  Location: [place];  Service: Orthopedics;  Laterality: Left;   INCISION AND DRAINAGE ABSCESS Left 10/15/2015   Procedure: INCISION AND DRAINAGE ABSCESS;  Surgeon: [name], MD;  Location: [facility] ORS;  Service: General;  Laterality: Left;   KIDNEY TRANSPLANT  09/13/2010   cadaver--at [facility] HEART CATH N/A 11/15/2016   Procedure: RIGHT HEART CATH;  Surgeon: [name], MD;  Location: [place] CV LAB;  Service: Cardiovascular;  Laterality: N/A;   TYMPANIC MEMBRANE  REPAIR  1/12   left   VASECTOMY      Prior to Admission medications   Medication Sig Start Date End Date Taking? Authorizing Provider  albuterol (PROVENTIL HFA;VENTOLIN HFA) 108 (90 Base) MCG/ACT inhaler Inhale 2 puffs every 6 (six) hours as needed into the lungs for wheezing or shortness of breath. 02/10/17  Yes [name] I, MD  amLODipine (NORVASC) 5 MG tablet Take 1 tablet (5 mg total) by mouth daily. 12/17/18  Yes [name], MD  beclomethasone (QVAR REDIHALER) 80 MCG/ACT inhaler Inhale 2 puffs into the lungs 2 (two) times daily. Rinse mouth after use. 06/15/18 06/15/19 Yes Laverle Hobby, MD  buPROPion (WELLBUTRIN SR) 150 MG 12 hr tablet TAKE 1 TABLET BY MOUTH TWICE DAILY Patient taking differently: Take 150 mg by mouth 2 (two) times daily.  09/03/18  Yes [name] I, MD  colchicine 0.6 MG tablet Take 1 tablet (0.6 mg total) by mouth 2 (two) times daily as needed. Patient taking differently: Take  0.6 mg by mouth 2 (two) times daily as needed (gout flares).  03/30/18  Yes [name], MD  ELIQUIS 5 MG TABS tablet TAKE 1 TABLET BY MOUTH TWICE DAILY Patient taking differently: Take 5 mg by mouth 2 (two) times daily.  12/31/18  Yes [name], MD  fluticasone (FLONASE) 50 MCG/ACT nasal spray PLACE 2 SPRAYS INTO BOTH NOSTRILS DAILY. 01/01/19  Yes [name], MD  furosemide (LASIX) 40 MG tablet Take 40-80 mg by mouth See admin instructions. Take 2 tablets (70m) by mouth every morning and take 1 tablet (45m by mouth every afternoon   Yes [provider]  gabapentin (NEURONTIN) 300 MG capsule Take 300 mg by mouth at bedtime.  12/01/18 12/01/19 Yes [provider]  HUMALOG KWIKPEN 100 UNIT/ML KwikPen Inject 5-10 Units into the skin 3 (three) times daily.  09/01/18  Yes [provider]  insulin glargine (LANTUS) 100 unit/mL SOPN Inject 0.1 mLs (10 Units total) into the skin at bedtime. Currently using 15 units Patient taking differently:  Inject 30 Units into the skin at bedtime.  11/16/16  Yes PaFritzi MandesMD  lisinopril (ZESTRIL) 40 MG tablet TAKE 1 TABLET BY MOUTH ONCE DAILY Patient taking differently: Take 40 mg by mouth daily.  08/19/18  Yes HaDarylene Price, FNP  magnesium 30 MG tablet Take 30 mg by mouth daily.   Yes [provider]  metolazone (ZAROXOLYN) 5 MG tablet TAKE 1 TABLET BY MOUTH AS NEEDED Patient taking differently: Take 5 mg by mouth daily as needed (excessive fluid retention).  12/03/17  Yes [name], Tina A, FNP  montelukast (SINGULAIR) 10 MG tablet TAKE 1 TABLET BY MOUTH AT BEDTIME Patient taking differently: Take 10 mg by mouth at bedtime.  06/01/18  Yes RaLaverle HobbyMD  Multiple Vitamin (MULTIVITAMIN WITH MINERALS) TABS tablet Take 1 tablet by mouth daily.   Yes [provider]  mycophenolate (MYFORTIC) 180 MG EC tablet Take 360 mg by mouth 2 (two) times daily.    Yes [provider]  omeprazole (PRILOSEC) 20 MG capsule TAKE 1 CAPSULE BY MOUTH ONCE DAILY Patient taking differently: Take 20 mg by mouth daily.  02/21/16  Yes LeViviana Simpler, MD  potassium chloride (MICRO-K) 10 MEQ CR capsule TAKE 2 CAPSULES (20 MEQ TOTAL) BY MOUTH 2 (TWO) TIMES DAILY. 05/18/18  Yes [name], TiOtila Kluver, FNP  rosuvastatin (CRESTOR) 10 MG tablet TAKE 1 TABLET BY MOUTH DAILY Patient taking differently: Take 10 mg by mouth daily.  06/09/18  Yes Ar[place] HampshireMD  tacrolimus (PROGRAF) 0.5 MG capsule Take 1 mg by mouth 2 (two) times daily.   Yes [provider]  tadalafil (CIALIS) 20 MG tablet Take 0.5-1 tablets (10-20 mg total) by mouth every other day as needed for erectile dysfunction. 12/23/18  Yes LeViviana Simpler, MD  tamsulosin (FLOMAX) 0.4 MG CAPS capsule Take 0.4 mg by mouth daily.    Yes [provider]  sildenafil (VIAGRA) 100 MG tablet TAKE 0.5-1 TABLETS (50-100 MG TOTAL) BY MOUTH DAILY AS NEEDED FOR ERECTILE DYSFUNCTION. Patient not taking: Reported on 01/15/2019 01/28/18    LeVenia CarbonMD  spironolactone (ALDACTONE) 25 MG tablet Take 1 tablet (25 mg total) by mouth daily. 11/16/16 12/10/18  PaFritzi MandesMD    Allergies Morphine and Ibuprofen  Family History  Problem Relation Age of Onset   Heart disease Father    Kidney failure Father    Kidney disease Father    Diabetes Maternal Grandmother  Breast cancer Maternal Grandmother    Valvular heart disease Mother    Liver cancer Paternal Uncle    Liver cancer Paternal Grandmother    Prostate cancer Neg Hx     Social History Social History   Tobacco Use   Smoking status: Former Smoker    Types: Cigars    Quit date: 09/02/1994    Years since quitting: 24.3   Smokeless tobacco: Never Used  Substance Use Topics   Alcohol use: Yes    Alcohol/week: 0.0 - 1.0 standard drinks    Comment: occasional- 3 in the past year   Drug use: No    Review of Systems  Constitutional: No fever/chills Eyes: No visual changes. ENT: No sore throat. Cardiovascular: Positive for chest pain. Respiratory: Positive for shortness of breath. Gastrointestinal: No abdominal pain.  No nausea, no vomiting.  No diarrhea.  No constipation. Genitourinary: Negative for dysuria. Musculoskeletal: Negative for back pain. Skin: Negative for rash. Neurological: Negative for headaches, focal weakness or numbness.  ____________________________________________   PHYSICAL EXAM:  VITAL SIGNS: ED Triage Vitals [01/15/19 1642]  Enc Vitals Group     BP (!) 148/74     Pulse Rate 75     Resp 18     Temp 98.4 F (36.9 C)     Temp Source Oral     SpO2 96 %     Weight 261 lb (118.4 kg)     Height 5' 10.5" (1.791 m)     Head Circumference      Peak Flow      Pain Score 8     Pain Loc      Pain Edu?      Excl. in [place]?     Constitutional: Alert and oriented. Eyes: Conjunctivae are normal. Head: Atraumatic. Nose: No congestion/rhinnorhea. Mouth/Throat: Mucous membranes are moist. Neck: Normal  ROM Cardiovascular: Normal rate, irregularly irregular rhythm. Grossly normal heart sounds. Respiratory: Normal respiratory effort.  No retractions. Lungs CTAB. Gastrointestinal: Soft and nontender. No distention. Genitourinary: deferred Musculoskeletal: No lower extremity tenderness nor edema. Neurologic:  Normal speech and language. No gross focal neurologic deficits are appreciated. Skin:  Skin is warm, dry and intact. No rash noted. Psychiatric: Mood and affect are normal. Speech and behavior are normal.  ____________________________________________   LABS (all labs ordered are listed, but only abnormal results are displayed)  Labs Reviewed  BASIC METABOLIC PANEL - Abnormal; Notable for the following components:      Result Value   Sodium 132 (*)    Potassium 3.4 (*)    Chloride 90 (*)    Glucose, Bld 520 (*)    BUN 47 (*)    Creatinine, Ser 1.53 (*)    GFR calc non Af Amer 49 (*)    GFR calc Af Amer 57 (*)    All other components within normal limits  CBC - Abnormal; Notable for the following components:   Hemoglobin 10.8 (*)    HCT 35.3 (*)    MCV 74.6 (*)    MCH 22.8 (*)    RDW 15.8 (*)    Platelets 137 (*)    All other components within normal limits  PROTIME-INR - Abnormal; Notable for the following components:   Prothrombin Time 17.9 (*)    INR 1.5 (*)    All other components within normal limits  GLUCOSE, CAPILLARY - Abnormal; Notable for the following components:   Glucose-Capillary 246 (*)    All other components within normal limits  TROPONIN I (  HIGH SENSITIVITY) - Abnormal; Notable for the following components:   Troponin I (High Sensitivity) 28 (*)    All other components within normal limits  TROPONIN I (HIGH SENSITIVITY) - Abnormal; Notable for the following components:   Troponin I (High Sensitivity) 26 (*)    All other components within normal limits  SARS CORONAVIRUS 2 (TAT 6-24 HRS)  HEMOGLOBIN A1C    ____________________________________________  EKG  ED ECG REPORT I, Blake Divine, the attending physician, personally viewed and interpreted this ECG.   Date: 01/15/2019  EKG Time: 16:38  Rate: 71  Rhythm: atrial fibrillation, rate 71  Axis: LAD  Intervals:left bundle branch block  ST&T Change: Negative [place]   PROCEDURES  Procedure(s) performed (including Critical Care):  Procedures   ____________________________________________   INITIAL IMPRESSION / ASSESSMENT AND PLAN / ED COURSE       58 year old male with history of CAD status post CABG and renal transplant presents to the ED complaining of acute onset of pain in the center of his chest described as "like my chest was caving in".  Pain has now resolved upon his arrival to the ED.  EKG shows no acute ischemic changes, left bundle branch block similar to prior.  Initial troponin is mildly elevated, however this is likely attributable to his chronic kidney disease.  Remainder of labs are significant for hyperglycemia without evidence of DKA.  Chest x-ray negative for acute process.  Patient has numerous risk factors and will admit for further ACS rule out.  Case discussed with hospitalist, who accepts patient for admission.      ____________________________________________   FINAL CLINICAL IMPRESSION(S) / ED DIAGNOSES  Final diagnoses:  Chest pain, unspecified type  S/P CABG (coronary artery bypass graft)  Hyperglycemia     ED Discharge Orders    None       Note:  This document was prepared using Dragon voice recognition software and may include unintentional dictation errors.   Blake Divine, MD 01/15/19 2118

## 2019-01-16 ENCOUNTER — Inpatient Hospital Stay (HOSPITAL_COMMUNITY)
Admit: 2019-01-16 | Discharge: 2019-01-16 | Disposition: A | Discharge status: Home / Self Care | Payer: 59 | Attending: Nurse Practitioner | Admitting: Nurse Practitioner

## 2019-01-16 ENCOUNTER — Inpatient Hospital Stay: Payer: 59

## 2019-01-16 DIAGNOSIS — I4819 Other persistent atrial fibrillation: Secondary | ICD-10-CM | POA: Diagnosis not present

## 2019-01-16 DIAGNOSIS — R079 Chest pain, unspecified: Secondary | ICD-10-CM

## 2019-01-16 DIAGNOSIS — I2510 Atherosclerotic heart disease of native coronary artery without angina pectoris: Secondary | ICD-10-CM | POA: Diagnosis not present

## 2019-01-16 LAB — COMPREHENSIVE METABOLIC PANEL
ALT: 11 U/L (ref 0–44)
AST: 14 U/L — ABNORMAL LOW (ref 15–41)
Albumin: 3.4 g/dL — ABNORMAL LOW (ref 3.5–5.0)
Alkaline Phosphatase: 87 U/L (ref 38–126)
Anion gap: 9 (ref 5–15)
BUN: 46 mg/dL — ABNORMAL HIGH (ref 6–20)
CO2: 33 mmol/L — ABNORMAL HIGH (ref 22–32)
Calcium: 8.9 mg/dL (ref 8.9–10.3)
Chloride: 96 mmol/L — ABNORMAL LOW (ref 98–111)
Creatinine, Ser: 1.55 mg/dL — ABNORMAL HIGH (ref 0.61–1.24)
GFR calc Af Amer: 56 mL/min — ABNORMAL LOW (ref >60)
GFR calc non Af Amer: 48 mL/min — ABNORMAL LOW (ref >60)
Glucose, Bld: 267 mg/dL — ABNORMAL HIGH (ref 70–99)
Potassium: 3.2 mmol/L — ABNORMAL LOW (ref 3.5–5.1)
Sodium: 138 mmol/L (ref 135–145)
Total Bilirubin: 0.7 mg/dL (ref 0.3–1.2)
Total Protein: 7.1 g/dL (ref 6.5–8.1)

## 2019-01-16 LAB — MAGNESIUM: Magnesium: 1.9 mg/dL (ref 1.7–2.4)

## 2019-01-16 LAB — HEMOGLOBIN A1C
Hgb A1c MFr Bld: 10 % — ABNORMAL HIGH (ref 4.8–5.6)
Mean Plasma Glucose: 240.3 mg/dL

## 2019-01-16 LAB — ECHOCARDIOGRAM COMPLETE
Height: 70 in
Weight: 4312 oz

## 2019-01-16 LAB — GLUCOSE, CAPILLARY
Glucose-Capillary: 234 mg/dL — ABNORMAL HIGH (ref 70–99)
Glucose-Capillary: 307 mg/dL — ABNORMAL HIGH (ref 70–99)

## 2019-01-16 LAB — SARS CORONAVIRUS 2 (TAT 6-24 HRS): SARS Coronavirus 2: NEGATIVE

## 2019-01-16 LAB — BRAIN NATRIURETIC PEPTIDE: B Natriuretic Peptide: 125 pg/mL — ABNORMAL HIGH (ref 0.0–100.0)

## 2019-01-16 MED ORDER — ALUM & MAG HYDROXIDE-SIMETH 200-200-20 MG/5ML PO SUSP
30.0000 mL | Freq: Once | ORAL | Status: AC
  Administered 2019-01-16: 30 mL via ORAL
  Filled 2019-01-16: qty 30

## 2019-01-16 MED ORDER — TACROLIMUS 1 MG PO CAPS
1.0000 mg | ORAL_CAPSULE | Freq: Two times a day (BID) | ORAL | Status: DC
  Administered 2019-01-16 (×2): 1 mg via ORAL
  Filled 2019-01-16 (×3): qty 1

## 2019-01-16 MED ORDER — MONTELUKAST SODIUM 10 MG PO TABS
10.0000 mg | ORAL_TABLET | Freq: Every day | ORAL | Status: DC
  Administered 2019-01-16: 10 mg via ORAL
  Filled 2019-01-16: qty 1

## 2019-01-16 MED ORDER — BUPROPION HCL ER (SR) 150 MG PO TB12
150.0000 mg | ORAL_TABLET | Freq: Two times a day (BID) | ORAL | Status: DC
  Administered 2019-01-16: 150 mg via ORAL
  Filled 2019-01-16 (×2): qty 1

## 2019-01-16 MED ORDER — POTASSIUM CHLORIDE CRYS ER 20 MEQ PO TBCR
40.0000 meq | EXTENDED_RELEASE_TABLET | Freq: Once | ORAL | Status: AC
  Administered 2019-01-16: 40 meq via ORAL
  Filled 2019-01-16: qty 2

## 2019-01-16 MED ORDER — BUDESONIDE 0.25 MG/2ML IN SUSP
0.2500 mg | Freq: Two times a day (BID) | RESPIRATORY_TRACT | Status: DC
  Administered 2019-01-16: 0.25 mg via RESPIRATORY_TRACT
  Filled 2019-01-16: qty 2

## 2019-01-16 MED ORDER — ALBUTEROL SULFATE (2.5 MG/3ML) 0.083% IN NEBU: 2.5000 mg | INHALATION_SOLUTION | Freq: Four times a day (QID) | RESPIRATORY_TRACT | Status: DC | PRN

## 2019-01-16 MED ORDER — FUROSEMIDE 40 MG PO TABS
40.0000 mg | ORAL_TABLET | Freq: Two times a day (BID) | ORAL | Status: DC
  Administered 2019-01-16: 40 mg via ORAL
  Filled 2019-01-16: qty 1

## 2019-01-16 MED ORDER — AMLODIPINE BESYLATE 5 MG PO TABS
5.0000 mg | ORAL_TABLET | Freq: Every day | ORAL | Status: DC
  Administered 2019-01-16: 5 mg via ORAL
  Filled 2019-01-16: qty 1

## 2019-01-16 MED ORDER — SPIRONOLACTONE 25 MG PO TABS
25.0000 mg | ORAL_TABLET | Freq: Every day | ORAL | Status: DC
  Administered 2019-01-16: 25 mg via ORAL
  Filled 2019-01-16: qty 1

## 2019-01-16 MED ORDER — ADULT MULTIVITAMIN W/MINERALS CH
1.0000 | ORAL_TABLET | Freq: Every day | ORAL | Status: DC
  Administered 2019-01-16: 1 via ORAL
  Filled 2019-01-16: qty 1

## 2019-01-16 MED ORDER — MYCOPHENOLATE SODIUM 180 MG PO TBEC
360.0000 mg | DELAYED_RELEASE_TABLET | Freq: Two times a day (BID) | ORAL | Status: DC
  Administered 2019-01-16 (×2): 360 mg via ORAL
  Filled 2019-01-16 (×3): qty 2

## 2019-01-16 MED ORDER — FENTANYL CITRATE (PF) 100 MCG/2ML IJ SOLN: 50.0000 ug | INTRAMUSCULAR | Status: DC | PRN

## 2019-01-16 MED ORDER — GABAPENTIN 300 MG PO CAPS
300.0000 mg | ORAL_CAPSULE | Freq: Every day | ORAL | Status: DC
  Administered 2019-01-16: 300 mg via ORAL
  Filled 2019-01-16: qty 1

## 2019-01-16 MED ORDER — PANTOPRAZOLE SODIUM 40 MG PO TBEC
40.0000 mg | DELAYED_RELEASE_TABLET | Freq: Every day | ORAL | Status: DC
  Administered 2019-01-16: 40 mg via ORAL
  Filled 2019-01-16: qty 1

## 2019-01-16 MED ORDER — APIXABAN 5 MG PO TABS
5.0000 mg | ORAL_TABLET | Freq: Two times a day (BID) | ORAL | Status: DC
  Administered 2019-01-16 (×2): 5 mg via ORAL
  Filled 2019-01-16 (×2): qty 1

## 2019-01-16 MED ORDER — FLUTICASONE PROPIONATE 50 MCG/ACT NA SUSP
2.0000 | Freq: Every day | NASAL | Status: DC
  Administered 2019-01-16: 2 via NASAL
  Filled 2019-01-16: qty 16

## 2019-01-16 MED ORDER — MAGNESIUM OXIDE 400 (241.3 MG) MG PO TABS
200.0000 mg | ORAL_TABLET | Freq: Every day | ORAL | Status: DC
  Administered 2019-01-16: 200 mg via ORAL
  Filled 2019-01-16: qty 1

## 2019-01-16 MED ORDER — OXYCODONE-ACETAMINOPHEN 5-325 MG PO TABS
1.0000 | ORAL_TABLET | ORAL | Status: DC | PRN
  Administered 2019-01-16: 2 via ORAL
  Filled 2019-01-16: qty 2

## 2019-01-16 MED ORDER — POTASSIUM CHLORIDE CRYS ER 20 MEQ PO TBCR
20.0000 meq | EXTENDED_RELEASE_TABLET | Freq: Every day | ORAL | Status: DC
  Administered 2019-01-16: 20 meq via ORAL
  Filled 2019-01-16: qty 1

## 2019-01-16 MED ORDER — FUROSEMIDE 40 MG PO TABS: 40.0000 mg | ORAL_TABLET | Freq: Two times a day (BID) | ORAL | Status: DC

## 2019-01-16 MED ORDER — TAMSULOSIN HCL 0.4 MG PO CAPS
0.4000 mg | ORAL_CAPSULE | Freq: Every day | ORAL | Status: DC
  Administered 2019-01-16: 0.4 mg via ORAL
  Filled 2019-01-16: qty 1

## 2019-01-16 MED ORDER — COLCHICINE 0.6 MG PO TABS: 0.6000 mg | ORAL_TABLET | Freq: Two times a day (BID) | ORAL | Status: DC | PRN

## 2019-01-16 MED ORDER — PERFLUTREN LIPID MICROSPHERE
3.0000 mL | INTRAVENOUS | Status: AC | PRN
  Administered 2019-01-16: 3 mL via INTRAVENOUS
  Filled 2019-01-16: qty 4

## 2019-01-16 MED ORDER — ROSUVASTATIN CALCIUM 10 MG PO TABS: 10.0000 mg | ORAL_TABLET | Freq: Every day | ORAL | Status: DC

## 2019-01-16 NOTE — Progress Notes (Signed)
Patient discharged to home with wife. Tele and IV d/c'd. Patient verbalizes understanding of discharge instructions.

## 2019-01-16 NOTE — Consult Note (Addendum)
Cardiology Consultation:   Patient ID: [name] MRN: [id number]; DOB: Aug 01, 1960  Admit date: 01/15/2019 Date of Consult: 01/16/2019  Primary Care Provider: [name], MD Primary Cardiologist: Carl Sacramento, MD  Primary Electrophysiologist:  None    Patient Profile:   [name] is a 58 y.o. male with a hx of hypertension, CAD/CABG x4 who is being seen today for the evaluation of chestpain at the request of Carl Falco, NP.  History of Present Illness:   [name] is a 58 year old male with a history of CAD status post CABG in 2005, chronic atrial fibrillation on Eliquis, pulmonary hypertension, end-stage renal disease was on dialysis from 2004-2012, status post kidney transplant in 2012, diabetes, hypertension, hyperlipidemia, beta-blocker induced bradycardia, obesity status post bypass in September 2015, sleep apnea on BiPAP.  Patient presents to the hospital due to chest pain starting at 3 PM yesterday.  He was walking to his car trying to get to the grocery store when symptoms began.  He noted sharp chest discomfort, rates it as a 10 out of 10, associated with shortness of breath.  Upon symptom onset, patient walked back into his house and told his wife who drove him to the emergency department.  Pain resolved when the patient got to the emergency department about 20 minutes later.  In the emergency room, x-ray showed pulmonary congestion.  EKG showed atrial fibrillation.  High-sensitivity troponins originally was 28, next value was 26.  Creatinine on admission was 1.53.  Patient was started on Lasix 40 twice daily orally.  He currently has no symptoms of chest pain.  He recently had a stress test in January of this year which was normal.  Last echocardiogram was also normal.    Heart Pathway Score:     Past Medical History:  Diagnosis Date  . Allergy    seasonal  . Amnestic MCI (mild cognitive impairment with memory loss)   . Asthma   . Benign neoplasm of  colon   . CAD (coronary artery disease) 2005   a.  Status post four-vessel CABG in 2005  . Chronic atrial fibrillation ([place])    a. CHADS2VASc 4 (CHF, HTN, DM, vascular disease); b. Eliquis  . Chronic combined systolic and diastolic CHF (congestive heart failure) ([place])    a.  TTE 7/18: EF 45-50%, moderately dilated LA, RVSF normal, PASP normal  . Chronic venous stasis dermatitis of both lower extremities   . Cytomegaloviral disease ([place]) 2017  . Depression   . Diabetes mellitus   . Diverticulosis   . Dyspnea   . Erectile dysfunction   . ESRD (end stage renal disease) ([place]) 2005   a. HD 2004-2012; b. S/p renal transplant in 2012  . GERD (gastroesophageal reflux disease)   . Gout   . Hyperlipidemia    low since renal failure  . Hypertension   . Kidney transplant status, cadaveric 2012  . Obesity   . Pneumonia   . Purpura ([place])   . Sleep apnea    bipap with oxygen 2 l  . Trigger finger   . Ulcer 05/2016   Left shin  . Wears glasses     Past Surgical History:  Procedure Laterality Date  . BARIATRIC SURGERY    . CARDIAC CATHETERIZATION     [facility]  . CATARACT EXTRACTION W/PHACO Right 10/29/2017   Procedure: CATARACT EXTRACTION PHACO AND INTRAOCULAR LENS PLACEMENT ([place])  RIGHT DIABETIC;  Surgeon: [name], MD;  Location: [place];  Service: Ophthalmology;  Laterality: Right;  Diabetic - insulin  . CATARACT EXTRACTION W/PHACO Left 11/18/2017   Procedure: CATARACT EXTRACTION PHACO AND INTRAOCULAR LENS PLACEMENT ([place]) LEFT IVA/TOPICAL;  Surgeon: [name], MD;  Location: [place];  Service: Ophthalmology;  Laterality: Left;  DIABETES - insulin sleep apnea  . COLONOSCOPY WITH PROPOFOL N/A 04/22/2013   Procedure: COLONOSCOPY WITH PROPOFOL;  Surgeon: [name], MD;  Location: [facility];  Service: Endoscopy;  Laterality: N/A;  . CORONARY ARTERY BYPASS GRAFT  2005   X 4  . DG ANGIO AV SHUNT*L*     right and left upper arms  . FASCIOTOMY   03/03/2012   Procedure: FASCIOTOMY;  Surgeon: [name], MD;  Location: [place];  Service: Orthopedics;  Laterality: Right;  FASCIOTOMY RIGHT SMALL FINGER  . FASCIOTOMY Left 08/17/2013   Procedure: FASCIOTOMY LEFT RING;  Surgeon: [name], MD;  Location: [place];  Service: Orthopedics;  Laterality: Left;  . INCISION AND DRAINAGE ABSCESS Left 10/15/2015   Procedure: INCISION AND DRAINAGE ABSCESS;  Surgeon: [name], MD;  Location: [facility];  Service: General;  Laterality: Left;  . KIDNEY TRANSPLANT  09/13/2010   cadaver--at [facility]  . RIGHT HEART CATH N/A 11/15/2016   Procedure: RIGHT HEART CATH;  Surgeon: [name], MD;  Location: [place] [facility];  Service: Cardiovascular;  Laterality: N/A;  . TYMPANIC MEMBRANE REPAIR  1/12   left  . VASECTOMY       Home Medications:  Prior to Admission medications   Medication Sig Start Date End Date Taking? Authorizing Provider  albuterol (PROVENTIL HFA;VENTOLIN HFA) 108 (90 Base) MCG/ACT inhaler Inhale 2 puffs every 6 (six) hours as needed into the lungs for wheezing or shortness of breath. 02/10/17  Yes [name] I, MD  amLODipine (NORVASC) 5 MG tablet Take 1 tablet (5 mg total) by mouth daily. 12/17/18  Yes [name], MD  beclomethasone (QVAR REDIHALER) 80 MCG/ACT inhaler Inhale 2 puffs into the lungs 2 (two) times daily. Rinse mouth after use. 06/15/18 06/15/19 Yes Laverle Hobby, MD  buPROPion (WELLBUTRIN SR) 150 MG 12 hr tablet TAKE 1 TABLET BY MOUTH TWICE DAILY Patient taking differently: Take 150 mg by mouth 2 (two) times daily.  09/03/18  Yes [name] I, MD  colchicine 0.6 MG tablet Take 1 tablet (0.6 mg total) by mouth 2 (two) times daily as needed. Patient taking differently: Take 0.6 mg by mouth 2 (two) times daily as needed (gout flares).  03/30/18  Yes Carl Carbon, MD  ELIQUIS 5 MG TABS tablet TAKE 1 TABLET BY MOUTH TWICE DAILY Patient taking differently:  Take 5 mg by mouth 2 (two) times daily.  12/31/18  Yes [name], MD  fluticasone (FLONASE) 50 MCG/ACT nasal spray PLACE 2 SPRAYS INTO BOTH NOSTRILS DAILY. 01/01/19  Yes Carl Carbon, MD  furosemide (LASIX) 40 MG tablet Take 40-80 mg by mouth See admin instructions. Take 2 tablets (67m) by mouth every morning and take 1 tablet (433m by mouth every afternoon   Yes [provider]  gabapentin (NEURONTIN) 300 MG capsule Take 300 mg by mouth at bedtime.  12/01/18 12/01/19 Yes [provider]  HUMALOG KWIKPEN 100 UNIT/ML KwikPen Inject 5-10 Units into the skin 3 (three) times daily.  09/01/18  Yes [provider]  insulin glargine (LANTUS) 100 unit/mL SOPN Inject 0.1 mLs (10 Units total) into the skin at bedtime. Currently using 15 units Patient taking differently: Inject 30  Units into the skin at bedtime.  11/16/16  Yes Fritzi Mandes, MD  lisinopril (ZESTRIL) 40 MG tablet TAKE 1 TABLET BY MOUTH ONCE DAILY Patient taking differently: Take 40 mg by mouth daily.  08/19/18  Yes Darylene Price A, FNP  magnesium 30 MG tablet Take 30 mg by mouth daily.   Yes [provider]  metolazone (ZAROXOLYN) 5 MG tablet TAKE 1 TABLET BY MOUTH AS NEEDED Patient taking differently: Take 5 mg by mouth daily as needed (excessive fluid retention).  12/03/17  Yes [name], Tina A, FNP  montelukast (SINGULAIR) 10 MG tablet TAKE 1 TABLET BY MOUTH AT BEDTIME Patient taking differently: Take 10 mg by mouth at bedtime.  06/01/18  Yes Laverle Hobby, MD  Multiple Vitamin (MULTIVITAMIN WITH MINERALS) TABS tablet Take 1 tablet by mouth daily.   Yes [provider]  mycophenolate (MYFORTIC) 180 MG EC tablet Take 360 mg by mouth 2 (two) times daily.    Yes [provider]  omeprazole (PRILOSEC) 20 MG capsule TAKE 1 CAPSULE BY MOUTH ONCE DAILY Patient taking differently: Take 20 mg by mouth daily.  02/21/16  Yes [name] I, MD  potassium chloride (MICRO-K) 10 MEQ CR  capsule TAKE 2 CAPSULES (20 MEQ TOTAL) BY MOUTH 2 (TWO) TIMES DAILY. 05/18/18  Yes [name], Otila Kluver A, FNP  rosuvastatin (CRESTOR) 10 MG tablet TAKE 1 TABLET BY MOUTH DAILY Patient taking differently: Take 10 mg by mouth daily.  06/09/18  Yes [name], MD  tacrolimus (PROGRAF) 0.5 MG capsule Take 1 mg by mouth 2 (two) times daily.   Yes [provider]  tadalafil (CIALIS) 20 MG tablet Take 0.5-1 tablets (10-20 mg total) by mouth every other day as needed for erectile dysfunction. 12/23/18  Yes [name] I, MD  tamsulosin (FLOMAX) 0.4 MG CAPS capsule Take 0.4 mg by mouth daily.    Yes [provider]  sildenafil (VIAGRA) 100 MG tablet TAKE 0.5-1 TABLETS (50-100 MG TOTAL) BY MOUTH DAILY AS NEEDED FOR ERECTILE DYSFUNCTION. Patient not taking: Reported on 01/15/2019 01/28/18   Carl Carbon, MD  spironolactone (ALDACTONE) 25 MG tablet Take 1 tablet (25 mg total) by mouth daily. 11/16/16 12/10/18  Fritzi Mandes, MD    Inpatient Medications: Scheduled Meds: . amLODipine  5 mg Oral Daily  . apixaban  5 mg Oral BID  . budesonide (PULMICORT) nebulizer solution  0.25 mg Nebulization BID  . buPROPion  150 mg Oral BID  . fluticasone  2 spray Each Nare Daily  . furosemide  40 mg Oral BID  . gabapentin  300 mg Oral QHS  . insulin aspart  0-5 Units Subcutaneous QHS  . insulin aspart  0-9 Units Subcutaneous TID WC  . insulin glargine  15 Units Subcutaneous QHS  . magnesium oxide  200 mg Oral Daily  . montelukast  10 mg Oral QHS  . multivitamin with minerals  1 tablet Oral Daily  . mycophenolate  360 mg Oral BID  . pantoprazole  40 mg Oral Daily  . potassium chloride  20 mEq Oral Daily  . rosuvastatin  10 mg Oral Daily  . spironolactone  25 mg Oral Daily  . tacrolimus  1 mg Oral BID  . tamsulosin  0.4 mg Oral Daily   Continuous Infusions:  PRN Meds: acetaminophen, albuterol, colchicine, fentaNYL (SUBLIMAZE) injection, nitroGLYCERIN, ondansetron (ZOFRAN) IV,  oxyCODONE-acetaminophen  Allergies:    Allergies  Allergen Reactions  . Morphine Itching  . Ibuprofen Other (See Comments)    Due to  kidney transplant    Social History:   Social History   Socioeconomic History  . Marital status: Married    Spouse name: Not on file  . Number of children: 2  . Years of education: Not on file  . Highest education level: Not on file  Occupational History  . Occupation: Academic librarian: unemployed    Comment: disabled due to kidney failure  Social Needs  . Financial resource strain: Not on file  . Food insecurity    Worry: Not on file    Inability: Not on file  . Transportation needs    Medical: Not on file    Non-medical: Not on file  Tobacco Use  . Smoking status: Former Smoker    Types: Cigars    Quit date: 09/02/1994    Years since quitting: 24.3  . Smokeless tobacco: Never Used  Substance and Sexual Activity  . Alcohol use: Yes    Alcohol/week: 0.0 - 1.0 standard drinks    Comment: occasional- 3 in the past year  . Drug use: No  . Sexual activity: Not on file  Lifestyle  . Physical activity    Days per week: Not on file    Minutes per session: Not on file  . Stress: Not on file  Relationships  . Social Herbalist on phone: Not on file    Gets together: Not on file    Attends religious service: Not on file    Active member of club or organization: Not on file    Attends meetings of clubs or organizations: Not on file    Relationship status: Not on file  . Intimate partner violence    Fear of current or ex partner: Not on file    Emotionally abused: Not on file    Physically abused: Not on file    Forced sexual activity: Not on file  Other Topics Concern  . Not on file  Social History Narrative   In school for culinary arts--- will finish 12/16      Has living will   Wife is health care POA   Would accept resuscitation attempts   Hasn't considered tube feedings    Family History:     Family History  Problem Relation Age of Onset  . Heart disease Father   . Kidney failure Father   . Kidney disease Father   . Diabetes Maternal Grandmother   . Breast cancer Maternal Grandmother   . Valvular heart disease Mother   . Liver cancer Paternal Uncle   . Liver cancer Paternal Grandmother   . Prostate cancer Neg Hx      ROS:  Please see the history of present illness.   All other ROS reviewed and negative.     Physical Exam/Data:   Vitals:   01/15/19 2116 01/16/19 0125 01/16/19 0418 01/16/19 0804  BP: (!) 147/80  (!) 143/79 129/69  Pulse: 64  (!) 51 (!) 51  Resp:   18 16  Temp: 98.9 F (37.2 C)  98.2 F (36.8 C) 97.8 F (36.6 C)  TempSrc: Oral  Oral Oral  SpO2: 95% 96% 97% 98%  Weight: 121.1 kg  122.2 kg   Height: _0  (1.778 m)       Intake/Output Summary (Last 24 hours) at 01/16/2019 1039 Last data filed at 01/16/2019 0538 Gross per 24 hour  Intake -  Output 400 ml  Net -400 ml   Last 3 Weights 01/16/2019 01/15/2019  01/15/2019  Weight (lbs) 269 lb 8 oz 266 lb 14.4 oz 261 lb  Weight (kg) 122.244 kg 121.065 kg 118.389 kg  Some encounter information is confidential and restricted. Go to Review Flowsheets activity to see all data.     Body mass index is 38.67 kg/m.  General:  Well nourished, well developed, in no acute distress HEENT: normal Lymph: no adenopathy Neck: no JVD Endocrine:  No thryomegaly Vascular: No carotid bruits; FA pulses 2+ bilaterally without bruits  Cardiac:  Irregular, irregular, no murmur , distant heart sounds Lungs:  clear to auscultation bilaterally, decreased breath sounds at bases Abd: soft, nontender, no hepatomegaly  Ext: no edema Musculoskeletal:  No deformities, BUE and BLE strength normal and equal Skin: warm and dry  Neuro:  CNs 2-12 intact, no focal abnormalities noted Psych:  Normal affect   EKG:  The EKG was personally reviewed and demonstrates: Atrial fibrillation, nonspecific intraventricular conduction  delay. Telemetry:  Telemetry was personally reviewed and demonstrates: Atrial fibrillation.  Relevant CV Studies: Echo 06/02/2018   1. The left ventricle has normal systolic function, with an ejection fraction of 60-65%. The cavity size was normal. Left ventricular diastolic function could not be evaluated.  2. The right ventricle has low normal systolic function. The cavity was mildly enlarged. There is no increase in right ventricular wall thickness. Right ventricular systolic pressure is severely elevated with an estimated pressure of 74.8 mmHg.  3. Left atrial size was moderately dilated.  4. The inferior vena cava was dilated in size with <50% respiratory variability.  lexiscan 04/10/2018 Pharmacological myocardial perfusion imaging study with no significant  Ischemia Small mild region fixed perfusion defect noted in the inferoapical and apical septal region, unable to exclude prior MI, though would consider attenuation artifact, even nonspecific intraventricular conduction abnormality GI uptake artifact noted Normal wall motion, EF estimated at 47% No EKG changes concerning for ischemia at peak stress or in recovery. Low risk scan  Laboratory Data:  High Sensitivity Troponin:   Recent Labs  Lab 01/15/19 1645 01/15/19 1833  TROPONINIHS 28* 26*     Chemistry Recent Labs  Lab 01/15/19 1645 01/16/19 0534  NA 132* 138  K 3.4* 3.2*  CL 90* 96*  CO2 30 33*  GLUCOSE 520* 267*  BUN 47* 46*  CREATININE 1.53* 1.55*  CALCIUM 9.2 8.9  GFRNONAA 49* 48*  GFRAA 57* 56*  ANIONGAP 12 9    Recent Labs  Lab 01/16/19 0534  PROT 7.1  ALBUMIN 3.4*  AST 14*  ALT 11  ALKPHOS 87  BILITOT 0.7   Hematology Recent Labs  Lab 01/15/19 1645  WBC 5.4  RBC 4.73  HGB 10.8*  HCT 35.3*  MCV 74.6*  MCH 22.8*  MCHC 30.6  RDW 15.8*  PLT 137*   BNP Recent Labs  Lab 01/16/19 0534  BNP 125.0*    DDimer No results for input(s): DDIMER in the last 168 hours.    Radiology/Studies:  Dg Chest 2 View  Result Date: 01/15/2019 CLINICAL DATA:  Hervey Ard, tight chest pain and shortness of breath. EXAM: CHEST - 2 VIEW COMPARISON:  11/05/2018 FINDINGS: Post median sternotomy with stable appearance of cardiomediastinal contours. Lungs are clear with pulmonary vascular congestion. Stable calcified density projecting over right mediastinal contour. IMPRESSION: Pulmonary vascular congestion. No other interval changes. Electronically Signed   By: Zetta Bills M.D.   On: 01/15/2019 17:37    Assessment and Plan:  Patient with history of CAD as stated above presenting with chest pain.  Chronic kidney disease with troponin of 1.5.  Initial high-sensitivity troponin revealed 28-26.  I do not think patient is having an acute coronary syndrome.  His last ultrasound of the heart and pharmacologic stress test were within normal limits.  A repeat echocardiogram has been ordered.  1. Chest pain, history of CAD status post CABG -We will evaluate echo. -Optimization of antianginal therapy is limited by patient's prior history of bradycardia with beta-blockers.  Also adding nitrates is contraindicated as patient is taking sildenafil for pulmonary hypertension.  If echo is normal, patient can be discharged on current medications with consideration of adding Ranexa as outpatient.  2. pulm htn -On sildenafil as outpatient.  Continue on DC  3. afib -Persistent/permanent A. fib.  Currently in A. fib.  Heart rate well controlled. continue Eliquis as prescribed.  4.  Chronic kidney disease status post renal transplant -Continue PTA medications as prescribed.    Signed, Kate Sable, MD  01/16/2019 10:39 AM

## 2019-01-16 NOTE — Discharge Summary (Signed)
[facility] NAME: [name]    MR#:  [id number]  DATE OF BIRTH:  Dec 07, 1960  DATE OF ADMISSION:  01/15/2019 ADMITTING PHYSICIAN: Lang Snow, NP  DATE OF DISCHARGE: 01/16/2019  PRIMARY CARE PHYSICIAN: [name], MD    ADMISSION DIAGNOSIS:  Hyperglycemia [R73.9] S/P CABG (coronary artery bypass graft) [Z95.1] Chest pain, unspecified type [R07.9]  DISCHARGE DIAGNOSIS:  Active Problems:   Chest pain with high risk of acute coronary syndrome   SECONDARY DIAGNOSIS:   Past Medical History:  Diagnosis Date  . Allergy    seasonal  . Amnestic MCI (mild cognitive impairment with memory loss)   . Asthma   . Benign neoplasm of colon   . CAD (coronary artery disease) 2005   a.  Status post four-vessel CABG in 2005  . Chronic atrial fibrillation ([place])    a. CHADS2VASc 4 (CHF, HTN, DM, vascular disease); b. Eliquis  . Chronic combined systolic and diastolic CHF (congestive heart failure) ([place])    a.  TTE 7/18: EF 45-50%, moderately dilated LA, RVSF normal, PASP normal  . Chronic venous stasis dermatitis of both lower extremities   . Cytomegaloviral disease ([place]) 2017  . Depression   . Diabetes mellitus   . Diverticulosis   . Dyspnea   . Erectile dysfunction   . ESRD (end stage renal disease) ([place]) 2005   a. HD 2004-2012; b. S/p renal transplant in 2012  . GERD (gastroesophageal reflux disease)   . Gout   . Hyperlipidemia    low since renal failure  . Hypertension   . Kidney transplant status, cadaveric 2012  . Obesity   . Pneumonia   . Purpura ([place])   . Sleep apnea    bipap with oxygen 2 l  . Trigger finger   . Ulcer 05/2016   Left shin  . Wears glasses     HOSPITAL COURSE:   1.  Atypical chest pain, borderline troponin of 28 and 26.  Patient feeling fine today.  Patient had a negative stress test back in January.  Seen by cardiology.  Echocardiogram showed a normal EF.  Patient ready to be  discharged home. 2.  Slight epigastric pain.  No signs of bleeding.  Taking his Eliquis twice a day.  Taking omeprazole.  I gave him 1 dose of Maalox today and seemed to resolve his pain. 3.  Chronic atrial fibrillation rate controlled without medication on Eliquis.  Patient actually had bradycardia episodes in the high 40s and low 50s. 4.  Acute kidney injury on chronic kidney disease.  Patient with kidney transplant on tacrolimus.  Lasix dose cut down to 40 mg twice a day. 5.  Chronic systolic congestive heart failure with pulmonary hypertension.  Continue Lasix 40 mg twice a day.  No beta-blocker secondary to bradycardia.  On lisinopril and spironolactone. 6.  Hyperlipidemia on Crestor 7.  Hyperlipidemia on Crestor 8.  Type 2 diabetes mellitus on Lantus and short acting insulin 9.  Obstructive sleep apnea on CPAP at night. 10.  Hypokalemia replace potassium extra dose today.  DISCHARGE CONDITIONS:   Satisfactory  CONSULTS OBTAINED:  Treatment Team:  Kate Sable, MD  DRUG ALLERGIES:   Allergies  Allergen Reactions  . Morphine Itching  . Ibuprofen Other (See Comments)    Due to kidney transplant    DISCHARGE MEDICATIONS:   Allergies as of 01/16/2019      Reactions   Morphine Itching   Ibuprofen Other (See  Comments)   Due to kidney transplant      Medication List    STOP taking these medications   metolazone 5 MG tablet Commonly known as: ZAROXOLYN     TAKE these medications   albuterol 108 (90 Base) MCG/ACT inhaler Commonly known as: VENTOLIN HFA Inhale 2 puffs every 6 (six) hours as needed into the lungs for wheezing or shortness of breath.   amLODipine 5 MG tablet Commonly known as: NORVASC Take 1 tablet (5 mg total) by mouth daily.   beclomethasone 80 MCG/ACT inhaler Commonly known as: Qvar RediHaler Inhale 2 puffs into the lungs 2 (two) times daily. Rinse mouth after use.   buPROPion 150 MG 12 hr tablet Commonly known as: WELLBUTRIN SR TAKE 1  TABLET BY MOUTH TWICE DAILY   colchicine 0.6 MG tablet Take 1 tablet (0.6 mg total) by mouth 2 (two) times daily as needed. What changed: reasons to take this   Eliquis 5 MG Tabs tablet Generic drug: apixaban TAKE 1 TABLET BY MOUTH TWICE DAILY What changed: how much to take   fluticasone 50 MCG/ACT nasal spray Commonly known as: FLONASE PLACE 2 SPRAYS INTO BOTH NOSTRILS DAILY.   furosemide 40 MG tablet Commonly known as: LASIX Take 1 tablet (40 mg total) by mouth 2 (two) times daily. What changed:   how much to take  when to take this  additional instructions   gabapentin 300 MG capsule Commonly known as: NEURONTIN Take 300 mg by mouth at bedtime.   HumaLOG KwikPen 100 UNIT/ML KwikPen Generic drug: insulin lispro Inject 5-10 Units into the skin 3 (three) times daily.   insulin glargine 100 unit/mL Sopn Commonly known as: LANTUS Inject 0.1 mLs (10 Units total) into the skin at bedtime. Currently using 15 units What changed:   how much to take  additional instructions   lisinopril 40 MG tablet Commonly known as: ZESTRIL TAKE 1 TABLET BY MOUTH ONCE DAILY   magnesium 30 MG tablet Take 30 mg by mouth daily.   montelukast 10 MG tablet Commonly known as: SINGULAIR TAKE 1 TABLET BY MOUTH AT BEDTIME   multivitamin with minerals Tabs tablet Take 1 tablet by mouth daily.   Myfortic 180 MG EC tablet Generic drug: mycophenolate Take 360 mg by mouth 2 (two) times daily.   omeprazole 20 MG capsule Commonly known as: PRILOSEC TAKE 1 CAPSULE BY MOUTH ONCE DAILY   potassium chloride 10 MEQ CR capsule Commonly known as: MICRO-K TAKE 2 CAPSULES (20 MEQ TOTAL) BY MOUTH 2 (TWO) TIMES DAILY.   rosuvastatin 10 MG tablet Commonly known as: CRESTOR TAKE 1 TABLET BY MOUTH DAILY   sildenafil 100 MG tablet Commonly known as: VIAGRA TAKE 0.5-1 TABLETS (50-100 MG TOTAL) BY MOUTH DAILY AS NEEDED FOR ERECTILE DYSFUNCTION.   spironolactone 25 MG tablet Commonly known as:  Aldactone Take 1 tablet (25 mg total) by mouth daily.   tacrolimus 0.5 MG capsule Commonly known as: PROGRAF Take 1 mg by mouth 2 (two) times daily.   tadalafil 20 MG tablet Commonly known as: CIALIS Take 0.5-1 tablets (10-20 mg total) by mouth every other day as needed for erectile dysfunction.   tamsulosin 0.4 MG Caps capsule Commonly known as: FLOMAX Take 0.4 mg by mouth daily.        DISCHARGE INSTRUCTIONS:   Follow-up PMD 5 days, follow-up cardiology 1 week  If you experience worsening of your admission symptoms, develop shortness of breath, life threatening emergency, suicidal or homicidal thoughts you must seek medical attention immediately  by calling 911 or calling your MD immediately  if symptoms less severe.  You Must read complete instructions/literature along with all the possible adverse reactions/side effects for all the Medicines you take and that have been prescribed to you. Take any new Medicines after you have completely understood and accept all the possible adverse reactions/side effects.   Please note  You were cared for by a hospitalist during your hospital stay. If you have any questions about your discharge medications or the care you received while you were in the hospital after you are discharged, you can call the unit and asked to speak with the hospitalist on call if the hospitalist that took care of you is not available. Once you are discharged, your primary care physician will handle any further medical issues. Please note that NO REFILLS for any discharge medications will be authorized once you are discharged, as it is imperative that you return to your primary care physician (or establish a relationship with a primary care physician if you do not have one) for your aftercare needs so that they can reassess your need for medications and monitor your lab values.    Today   CHIEF COMPLAINT:   Chief Complaint  Patient presents with  . Chest Pain     HISTORY OF PRESENT ILLNESS:  Jhace Kennerly  is a 58 y.o. male presented with chest pain   VITAL SIGNS:  Blood pressure 129/69, pulse (!) 51, temperature 97.8 F (36.6 C), temperature source Oral, resp. rate 16, height _0  (1.778 m), weight 122.2 kg, SpO2 98 %.   PHYSICL EXAMINATION:  GENERAL:  58 y.o.-year-old patient lying in the bed with no acute distress.  EYES: Pupils equal, round, reactive to light and accommodation. No scleral icterus. Extraocular muscles intact.  HEENT: Head atraumatic, normocephalic. Oropharynx and nasopharynx clear.  NECK:  Supple, no jugular venous distention. No thyroid enlargement, no tenderness.  LUNGS: Normal breath sounds bilaterally, no wheezing, rales,rhonchi or crepitation. No use of accessory muscles of respiration.  CARDIOVASCULAR: S1, S2 bradycardia. No murmurs, rubs, or gallops.  ABDOMEN: Soft, non-tender, non-distended. Bowel sounds present. No organomegaly or mass.  EXTREMITIES: No pedal edema, cyanosis, or clubbing.  NEUROLOGIC: Cranial nerves II through XII are intact. Muscle strength 5/5 in all extremities. Sensation intact. Gait not checked.  PSYCHIATRIC: The patient is alert and oriented x 3.  SKIN: No obvious rash, lesion, or ulcer.   DATA REVIEW:   CBC Recent Labs  Lab 01/15/19 1645  WBC 5.4  HGB 10.8*  HCT 35.3*  PLT 137*    Chemistries  Recent Labs  Lab 01/16/19 0534  NA 138  K 3.2*  CL 96*  CO2 33*  GLUCOSE 267*  BUN 46*  CREATININE 1.55*  CALCIUM 8.9  MG 1.9  AST 14*  ALT 11  ALKPHOS 87  BILITOT 0.7    Microbiology Results  Results for orders placed or performed during the hospital encounter of 01/15/19  SARS CORONAVIRUS 2 (TAT 6-24 HRS) Nasopharyngeal Nasopharyngeal Swab     Status: None   Collection Time: 01/15/19  7:22 PM   Specimen: Nasopharyngeal Swab  Result Value Ref Range Status   SARS Coronavirus 2 NEGATIVE NEGATIVE Final    Comment: (NOTE) SARS-CoV-2 target nucleic acids are NOT  DETECTED. The SARS-CoV-2 RNA is generally detectable in upper and lower respiratory specimens during the acute phase of infection. Negative results do not preclude SARS-CoV-2 infection, do not rule out co-infections with other pathogens, and should not be used  as the sole basis for treatment or other patient management decisions. Negative results must be combined with clinical observations, patient history, and epidemiological information. The expected result is Negative. Fact Sheet for Patients: SugarRoll.be Fact Sheet for Healthcare Providers: https://www.woods-mathews.com/ This test is not yet approved or cleared by the [place] FDA and  has been authorized for detection and/or diagnosis of SARS-CoV-2 by FDA under an Emergency Use Authorization (EUA). This EUA will remain  in effect (meaning this test can be used) for the duration of the COVID-19 declaration under Section 56 4(b)(1) of the Act, 21 U.S.C. section 360bbb-3(b)(1), unless the authorization is terminated or revoked sooner. Performed at [facility], [place] [address], [place], [place] [address]     RADIOLOGY:  Dg Chest 2 View  Result Date: 01/15/2019 CLINICAL DATA:  Hervey Ard, tight chest pain and shortness of breath. EXAM: CHEST - 2 VIEW COMPARISON:  11/05/2018 FINDINGS: Post median sternotomy with stable appearance of cardiomediastinal contours. Lungs are clear with pulmonary vascular congestion. Stable calcified density projecting over right mediastinal contour. IMPRESSION: Pulmonary vascular congestion. No other interval changes. Electronically Signed   By: Zetta Bills M.D.   On: 01/15/2019 17:37   Ct Chest Wo Contrast  Result Date: 01/16/2019 CLINICAL DATA:  Shortness of breath EXAM: CT CHEST WITHOUT CONTRAST TECHNIQUE: Multidetector CT imaging of the chest was performed following the standard protocol without IV contrast. COMPARISON:  Chest CT dated 11/05/2018  FINDINGS: Cardiovascular: Vascular calcifications are seen in the coronary arteries and aortic arch. The heart remains enlarged. There is no pericardial effusion. Mediastinum/Nodes: Calcified mediastinal and right hilar lymph nodes are likely sequela of prior granulomatous disease. There is no axillary lymphadenopathy. Thyroid gland, trachea, and esophagus demonstrate no significant findings. Lungs/Pleura: Punctate pulmonary calcifications are sequela of prior granulomatous disease. There is minimal right basilar atelectasis. A small amount of scarring is seen at the apex of the left lower lobe. No pleural effusion or pneumothorax. Upper Abdomen: The patient is status post a sleeve gastrectomy. Musculoskeletal: No chest wall mass or suspicious bone lesions identified. IMPRESSION: 1. No acute findings are evident in the chest. 2. Cardiomegaly. 3. Coronary and aortic atherosclerosis. Aortic Atherosclerosis (ICD10-I70.0). Electronically Signed   By: Zerita Boers M.D.   On: 01/16/2019 12:07       Management plans discussed with the patient, and he is in agreement.  CODE STATUS:     Code Status Orders  (From admission, onward)         Start     Ordered   01/15/19 2006  Full code  Continuous     01/15/19 2008        Code Status History    Date Active Date Inactive Code Status Order ID Comments User Context   11/05/2018 1224 11/06/2018 1638 Full Code [id number]  Dustin Flock, MD Inpatient   11/11/2016 1637 11/16/2016 1833 Full Code [id number]  Loletha Grayer, MD Inpatient   10/12/2015 2005 10/18/2015 1646 Full Code [id number]  Gladstone Lighter, MD Inpatient   08/30/2015 2122 09/04/2015 2217 Full Code [id number]  Fritzi Mandes, MD Inpatient   10/13/2014 0735 10/14/2014 1647 Full Code [id number]  Juluis Mire, MD Inpatient   Advance Care Planning Activity    Advance Directive Documentation     Most Recent Value  Type of Advance Directive  Healthcare Power of Attorney, Living will  Pre-existing  out of facility DNR order (yellow form or pink MOST form)  -  "MOST" Form in Place?  -  TOTAL TIME TAKING CARE OF THIS PATIENT: 35 minutes.    Loletha Grayer M.D on 01/16/2019 at 2:54 PM  Between 7am to 6pm - Pager - [phone]  After 6pm go to [email or web address] - password [email or web address]  [employer] Office  [phone]  CC: Primary care physician; [name], MD

## 2019-01-18 ENCOUNTER — Telehealth: Payer: Self-pay

## 2019-01-18 NOTE — Progress Notes (Signed)
Cardiology Office Note    Date:  01/19/2019   ID:  Theophilus, Walz 09/27/1960, MRN [id number]  PCP:  [name], MD  Cardiologist:  [name], MD  Electrophysiologist:  None   Chief Complaint: Hospital follow-up  History of Present Illness:   [name] is a 58 y.o. male with history of CAD status post CABG in 2005, chronic combined systolic and diastolic CHF secondary to ICM with prior EF of 35 to 40% subsequently improved to 55% by echo in 03/2018, chronic A. fib on Eliquis, pulmonary hypertension, ESRD previously on hemodialysis from 2004 through 2012 status post kidney transplant in 2012, anemia of chronic disease, poorly controlled DM2, HTN, HLD, beta-blocker induced bradycardia, obesity status post gastric bypass surgery in 11/2013, and sleep apnea on BiPAP who presents for hospital follow-up as detailed below.  Patient underwent four-vessel bypass in 2005. Patient was noted to have worsening shortness of breath with right-sided heart failure in the summer 2018. Echo in 09/2016 was an overall difficult study. EFwas noted to be improved at 45-50%,moderately dilated left atrium, RV systolic function normal, PASP normal. He was admitted in 10/2016 requiring IV diuresis. RHC showed a severely elevated filling pressures, severe pulmonary hypertension and low normal cardiac output after several days of diuresis. His pulmonary wedge pressure was 31 mmHg. He was diuresed with a Lasix drip.  Stress testing in 03/2018 showed no significant ischemia with a small region of fixed perfusion defect in the inferior apical and apical septal region possibly felt to be secondary to attenuation artifact with an EF of 47%.  Overall, this was a low risk study.  Echo in 05/2018 showed normal LV systolic function with severe pulmonary hypertension with an estimated PASP of 74 mmHg.  He was referred to the advanced heart failure service given his severe pulmonary hypertension with evidence of  RV failure.  Outpatient cardiac monitoring in 07/2018, done for intermittent episodes of dizziness with reported heart rate in the high 40s without syncope showed chronic A. fib with no significant bradycardic events except during sleep when his heart rate was in the 30s bpm.  There was no indication for pacemaker at that time.  He was admitted to the hospital in 10/2018 with low-grade fever shortness of breath with high-sensitivity troponin being negative, COVID-19 negative, blood culture negative.  He was placed on empiric antibiotics and discharged home.  He was most recently seen by our office virtually on 12/10/2018 and was doing reasonably well.  His weight was 4 pounds above his dry weight, but the patient reported weight of 260 pounds at that time, leading the patient to have doubled his Lasix.  Patient was admitted to the hospital from 10/23 through 10/24 with 10 out of 10 chest pain that began while walking to his car to go to the grocery store and was described as sharp with associated shortness of breath.  Symptoms resolved upon arrival to the ED approximately 20 minutes later.  High-sensitivity troponin initially 28 with a delta of 26.  Serum creatinine 1.53.  Chest x-ray showed pulmonary vascular congestion.  EKG showed rate controlled A. fib.  Patient was started on oral Lasix 40 mg twice daily.  Echo on 01/16/2019 showed an EF of 50 to 55%, RV was not well visualized, severe biatrial enlargement, severe calcification of the posterior mitral valve leaflet with trace mitral valve regurgitation.  Given reassuring inpatient work-up patient was discharged to outpatient follow-up.  He comes in doing reasonably well from  a cardiac perspective.  He has not had any further chest pain.  He does note significant volume overload with a weight that is up 10 to 15 pounds (15 pounds by our scale when compared to last in person office visit).  He has been compliant with all medications, fluid intake, and low-sodium  diet.  He notes mild lower extremity swelling with significant abdominal distention.  He has stable two-pillow orthopnea and denies any early satiety.  Mild shortness of breath without dizziness, presyncope, or syncope.  No falls, BRBPR, or melena.  He does note a decrease in functional status secondary to right knee pain for which she will be seeing his PCP later this week.  He does ask about the possibility of using IV Lasix in the office today.   Labs: 12/2018 -BNP 125, magnesium 1.9, potassium 3.2, BUN 46, serum creatinine 1.55, albumin 3.4, AST/ALT normal, A1c 10.0, Hgb 10.8, PLT 137  Past Medical History:  Diagnosis Date   Allergy    seasonal   Amnestic MCI (mild cognitive impairment with memory loss)    Asthma    Benign neoplasm of colon    CAD (coronary artery disease) 2005   a.  Status post four-vessel CABG in 2005   Chronic atrial fibrillation ([place])    a. CHADS2VASc 4 (CHF, HTN, DM, vascular disease); b. Eliquis   Chronic combined systolic and diastolic CHF (congestive heart failure) ([place])    a.  TTE 7/18: EF 45-50%, moderately dilated LA, RVSF normal, PASP normal   Chronic venous stasis dermatitis of both lower extremities    Cytomegaloviral disease ([place]) 2017   Depression    Diabetes mellitus    Diverticulosis    Dyspnea    Erectile dysfunction    ESRD (end stage renal disease) ([place]) 2005   a. HD 2004-2012; b. S/p renal transplant in 2012   GERD (gastroesophageal reflux disease)    Gout    Hyperlipidemia    low since renal failure   Hypertension    Kidney transplant status, cadaveric 2012   Obesity    Pneumonia    Purpura ([place])    Sleep apnea    bipap with oxygen 2 l   Trigger finger    Ulcer 05/2016   Left shin   Wears glasses     Past Surgical History:  Procedure Laterality Date   BARIATRIC SURGERY     CARDIAC CATHETERIZATION     [facility]   CATARACT EXTRACTION W/PHACO Right 10/29/2017   Procedure: CATARACT EXTRACTION PHACO AND  INTRAOCULAR LENS PLACEMENT ([place])  RIGHT DIABETIC;  Surgeon: [name], MD;  Location: [place];  Service: Ophthalmology;  Laterality: Right;  Diabetic - insulin   CATARACT EXTRACTION W/PHACO Left 11/18/2017   Procedure: CATARACT EXTRACTION PHACO AND INTRAOCULAR LENS PLACEMENT ([place]) LEFT IVA/TOPICAL;  Surgeon: [name], MD;  Location: [place];  Service: Ophthalmology;  Laterality: Left;  DIABETES - insulin sleep apnea   COLONOSCOPY WITH PROPOFOL N/A 04/22/2013   Procedure: COLONOSCOPY WITH PROPOFOL;  Surgeon: [name], MD;  Location: [facility];  Service: Endoscopy;  Laterality: N/A;   CORONARY ARTERY BYPASS GRAFT  2005   X 4   DG ANGIO AV SHUNT*L*     right and left upper arms   FASCIOTOMY  03/03/2012   Procedure: FASCIOTOMY;  Surgeon: [name], MD;  Location: [place];  Service: Orthopedics;  Laterality: Right;  FASCIOTOMY RIGHT SMALL FINGER   FASCIOTOMY Left 08/17/2013   Procedure: FASCIOTOMY LEFT  RING;  Surgeon: [name], MD;  Location: [place];  Service: Orthopedics;  Laterality: Left;   INCISION AND DRAINAGE ABSCESS Left 10/15/2015   Procedure: INCISION AND DRAINAGE ABSCESS;  Surgeon: [name], MD;  Location: [facility];  Service: General;  Laterality: Left;   KIDNEY TRANSPLANT  09/13/2010   cadaver--[facility] HEART CATH N/A 11/15/2016   Procedure: RIGHT HEART CATH;  Surgeon: [name], MD;  Location: [place] [facility];  Service: Cardiovascular;  Laterality: N/A;   TYMPANIC MEMBRANE REPAIR  1/12   left   VASECTOMY      Current Medications: Current Meds  Medication Sig   albuterol (PROVENTIL HFA;VENTOLIN HFA) 108 (90 Base) MCG/ACT inhaler Inhale 2 puffs every 6 (six) hours as needed into the lungs for wheezing or shortness of breath.   amLODipine (NORVASC) 5 MG tablet Take 1 tablet (5 mg total) by mouth daily.   beclomethasone (QVAR REDIHALER) 80 MCG/ACT  inhaler Inhale 2 puffs into the lungs 2 (two) times daily. Rinse mouth after use.   buPROPion (WELLBUTRIN SR) 150 MG 12 hr tablet TAKE 1 TABLET BY MOUTH TWICE DAILY (Patient taking differently: Take 150 mg by mouth 2 (two) times daily. )   colchicine 0.6 MG tablet Take 1 tablet (0.6 mg total) by mouth 2 (two) times daily as needed. (Patient taking differently: Take 0.6 mg by mouth 2 (two) times daily as needed (gout flares). )   ELIQUIS 5 MG TABS tablet TAKE 1 TABLET BY MOUTH TWICE DAILY (Patient taking differently: Take 5 mg by mouth 2 (two) times daily. )   fluticasone (FLONASE) 50 MCG/ACT nasal spray PLACE 2 SPRAYS INTO BOTH NOSTRILS DAILY.   furosemide (LASIX) 40 MG tablet Take 1 tablet (40 mg total) by mouth 2 (two) times daily.   gabapentin (NEURONTIN) 300 MG capsule Take 300 mg by mouth at bedtime.    HUMALOG KWIKPEN 100 UNIT/ML KwikPen Inject 5-10 Units into the skin 3 (three) times daily.    insulin glargine (LANTUS) 100 unit/mL SOPN Inject 0.1 mLs (10 Units total) into the skin at bedtime. Currently using 15 units (Patient taking differently: Inject 30 Units into the skin at bedtime. )   lisinopril (ZESTRIL) 40 MG tablet TAKE 1 TABLET BY MOUTH ONCE DAILY (Patient taking differently: Take 40 mg by mouth daily. )   magnesium 30 MG tablet Take 30 mg by mouth daily.   montelukast (SINGULAIR) 10 MG tablet TAKE 1 TABLET BY MOUTH AT BEDTIME (Patient taking differently: Take 10 mg by mouth at bedtime. )   Multiple Vitamin (MULTIVITAMIN WITH MINERALS) TABS tablet Take 1 tablet by mouth daily.   mycophenolate (MYFORTIC) 180 MG EC tablet Take 360 mg by mouth 2 (two) times daily.    omeprazole (PRILOSEC) 20 MG capsule TAKE 1 CAPSULE BY MOUTH ONCE DAILY (Patient taking differently: Take 20 mg by mouth daily. )   potassium chloride (MICRO-K) 10 MEQ CR capsule TAKE 2 CAPSULES (20 MEQ TOTAL) BY MOUTH 2 (TWO) TIMES DAILY.   rosuvastatin (CRESTOR) 10 MG tablet TAKE 1 TABLET BY MOUTH DAILY  (Patient taking differently: Take 10 mg by mouth daily. )   sildenafil (VIAGRA) 100 MG tablet TAKE 0.5-1 TABLETS (50-100 MG TOTAL) BY MOUTH DAILY AS NEEDED FOR ERECTILE DYSFUNCTION.   spironolactone (ALDACTONE) 25 MG tablet Take 1 tablet (25 mg total) by mouth daily.   tacrolimus (PROGRAF) 0.5 MG capsule Take 1 mg by mouth 2 (two) times daily.   tadalafil (CIALIS)  20 MG tablet Take 0.5-1 tablets (10-20 mg total) by mouth every other day as needed for erectile dysfunction.   tamsulosin (FLOMAX) 0.4 MG CAPS capsule Take 0.4 mg by mouth daily.     Allergies:   Morphine and Ibuprofen   Social History   Socioeconomic History   Marital status: Married    Spouse name: Not on file   Number of children: 2   Years of education: Not on file   Highest education level: Not on file  Occupational History   Occupation: Academic librarian: unemployed    Comment: disabled due to kidney failure  Social Needs   Emergency planning/management officer strain: Not on file   Food insecurity    Worry: Not on file    Inability: Not on file   Transportation needs    Medical: Not on file    Non-medical: Not on file  Tobacco Use   Smoking status: Former Smoker    Types: Cigars    Quit date: 09/02/1994    Years since quitting: 24.3   Smokeless tobacco: Never Used  Substance and Sexual Activity   Alcohol use: Yes    Alcohol/week: 0.0 - 1.0 standard drinks    Comment: occasional- 3 in the past year   Drug use: No   Sexual activity: Not on file  Lifestyle   Physical activity    Days per week: Not on file    Minutes per session: Not on file   Stress: Not on file  Relationships   Social connections    Talks on phone: Not on file    Gets together: Not on file    Attends religious service: Not on file    Active member of club or organization: Not on file    Attends meetings of clubs or organizations: Not on file    Relationship status: Not on file  Other Topics Concern   Not on  file  Social History Narrative   In school for culinary arts--- will finish 12/16      Has living will   Wife is health care POA   Would accept resuscitation attempts   Hasn't considered tube feedings     Family History:  The patient's family history includes Breast cancer in his maternal grandmother; Diabetes in his maternal grandmother; Heart disease in his father; Kidney disease in his father; Kidney failure in his father; Liver cancer in his paternal grandmother and paternal uncle; Valvular heart disease in his mother. There is no history of Prostate cancer.  ROS:   Review of Systems  Constitutional: Positive for malaise/fatigue. Negative for chills, diaphoresis, fever and weight loss.  HENT: Negative for congestion.   Eyes: Negative for discharge and redness.  Respiratory: Positive for shortness of breath. Negative for cough, hemoptysis, sputum production and wheezing.   Cardiovascular: Positive for chest pain and leg swelling. Negative for palpitations, orthopnea, claudication and PND.  Gastrointestinal: Negative for abdominal pain, blood in stool, heartburn, melena, nausea and vomiting.       Abdominal swelling  Genitourinary: Negative for hematuria.  Musculoskeletal: Positive for joint pain. Negative for falls and myalgias.       Right knee pain  Skin: Negative for rash.  Neurological: Positive for weakness. Negative for dizziness, tingling, tremors, sensory change, speech change, focal weakness and loss of consciousness.  Endo/Heme/Allergies: Does not bruise/bleed easily.  Psychiatric/Behavioral: Negative for substance abuse. The patient is not nervous/anxious.   All other systems reviewed and are negative.  EKGs/Labs/Other Studies Reviewed:    Studies reviewed were summarized above. The additional studies were reviewed today: As above.   EKG:  EKG is ordered today.  The EKG ordered today demonstrates A. fib with slow ventricular response, 58 bpm, nonspecific IVCD  with left bundle branch block morphology, grossly unchanged from prior  Recent Labs: 01/15/2019: Hemoglobin 10.8; Platelets 137 01/16/2019: ALT 11; B Natriuretic Peptide 125.0; BUN 46; Creatinine, Ser 1.55; Magnesium 1.9; Potassium 3.2; Sodium 138  Recent Lipid Panel    Component Value Date/Time   CHOL 89 10/09/2017   CHOL 161 03/07/2015 0823   TRIG 73 03/06/2016 0736   HDL 34 (L) 03/06/2016 0736   HDL 38 (L) 03/07/2015 0823   CHOLHDL 2.7 03/06/2016 0736   VLDL 15 03/06/2016 0736   LDLCALC 49 10/09/2017   LDLCALC 105 (H) 03/07/2015 0823   LDLDIRECT 41.9 04/20/2012 1618    PHYSICAL EXAM:    VS:  BP (!) 160/84 (BP Location: Left Arm, Patient Position: Sitting, Cuff Size: Large)    Pulse (!) 58    Ht _0  (1.778 m)    Wt 276 lb 8 oz (125.4 kg)    SpO2 98%    BMI 39.67 kg/m   BMI: Body mass index is 39.67 kg/m.  Physical Exam  Constitutional: He is oriented to person, place, and time. He appears well-developed and well-nourished.  HENT:  Head: Normocephalic and atraumatic.  Eyes: Right eye exhibits no discharge. Left eye exhibits no discharge.  Neck: Normal range of motion. JVD present.  JVD elevated 8 cm.   Cardiovascular: S1 normal and S2 normal. An irregularly irregular rhythm present. Bradycardia present. Exam reveals no distant heart sounds, no friction rub, no midsystolic click and no opening snap.  Murmur heard.  Harsh midsystolic murmur is present with a grade of 2/6 at the upper right sternal border radiating to the neck. Pulses:      Posterior tibial pulses are 2+ on the right side and 2+ on the left side.  Pulmonary/Chest: Effort normal and breath sounds normal. No respiratory distress. He has no decreased breath sounds. He has no wheezes. He has no rales. He exhibits no tenderness.  Abdominal: Soft. He exhibits distension. There is no abdominal tenderness.  Musculoskeletal:        General: Edema present.     Comments: Trace bilateral pre-tibial edema    Neurological: He is alert and oriented to person, place, and time.  Skin: Skin is warm and dry. No cyanosis. Nails show no clubbing.  Psychiatric: He has a normal mood and affect. His speech is normal and behavior is normal. Judgment and thought content normal.    Wt Readings from Last 3 Encounters:  01/19/19 276 lb 8 oz (125.4 kg)  01/16/19 269 lb 8 oz (122.2 kg)  12/23/18 272 lb (123.4 kg)     ASSESSMENT & PLAN:   1. CAD involving native coronary arteries status post CABG with stable angina: Currently chest pain-free.  Unable to add beta-blocker for antianginal effect secondary to bradycardic events as outlined below.  Ranexa may not be ideal given underlying left bundle branch block morphology with prior noted bradycardic events.  Patient notes he became worried as his neighbor recently passed away from an MI.  Patient take sildenafil on an as-needed basis for erectile dysfunction.  Cannot exclude some of the patient's symptoms being related to volume overload with his weight being up 15 pounds from baseline.  We will initially plan for IV diuresis as outlined  below with close follow up.  On Eliquis in place of ASA.    2. Acute on chronic combined systolic and diastolic CHF/severe pulmonary hypertension: Patient is NYHA class II.  Patient has had subsequent normalization of LV systolic function as outlined above.  His weight is up 15 pounds when compared to his last in-person office visit with [name] in 07/2018 with a weight trend from 261 to 276 pounds today.  He notes some increase shortness of breath but predominantly is noticing increased abdominal distention.  He does ask for IV Lasix in the office today.  Given he does not show signs of volume overload on exam with mild lower extremity swelling and abdominal tension with an elevated JVD we will get the patient IV Lasix 40 mg x 1 today.  We will check a BMP today and again at the end of the week to assess his potassium and renal function.  Not on  beta-blocker therapy secondary to underlying bradycardic rates as outlined below.  Continue lisinopril and spironolactone.  Of note, the patient does not take sildenafil daily for pulmonary hypertension, this is an as needed medicine for erectile dysfunction.  3. Permanent A. Fib: He has been in A. fib since 2016 and demonstrates a controlled ventricular response.  Continue anticoagulation with Eliquis given CHADS2VASc of at least 4 (CHF, HTN, DM, vascular disease).  Not requiring AV nodal blocking agents in the setting of prior bradycardia.  4. ESRD: Status post kidney transplant with most recent serum creatinine of 1.55 which is slightly above his baseline.  Check BMP as outlined above.  Follow-up with nephrology as directed.  5. Intermittent bradycardia: Prior outpatient cardiac monitoring showed no significant bradycardic events which would require pacemaker.  Patient does have an underlying IVCD with LBBB morphology.  In this setting, AV nodal agents have been deferred as he is at high risk for significant bradycardic events.  Continue to monitor.  Low threshold to refer to EP.  6. HLD: Most recent LDL 43.  Continue Crestor 10 mg daily.  7. HTN: Blood pressure is elevated today.  Given IV Lasix in the office as above.  Continue lisinopril and spironolactone.   Disposition: F/u with [name] or an APP in 1-2 weeks.   Medication Adjustments/Labs and Tests Ordered: Current medicines are reviewed at length with the patient today.  Concerns regarding medicines are outlined above. Medication changes, Labs and Tests ordered today are summarized above and listed in the Patient Instructions accessible in Encounters.   Signed, Christell Faith, PA-C 01/19/2019 8:41 AM     [facility] [address] [place], [place] [address] [phone]

## 2019-01-18 NOTE — Telephone Encounter (Signed)
Transition Care Management Follow-up Telephone Call   Date discharged? 01/16/2019   How have you been since you were released from the hospital? Patient states he is doing ok. No more chest pain or any other symptoms at this time.   Do you understand why you were in the hospital? yes   Do you understand the discharge instructions? yes   Where were you discharged to? home   Items Reviewed:  Medications reviewed: yes  Allergies reviewed: yes  Dietary changes reviewed: yes  Referrals reviewed: cardiology referral needs to be set up   Functional Questionnaire:   Activities of Daily Living (ADLs):   He states they are independent in the following: ambulation, dressing, hygiene, toileting, fixing food and medication, bathing. States they require assistance with the following:none at this time   Any transportation issues/concerns?: no   Any patient concerns? Not at this time   Confirmed importance and date/time of follow-up visits scheduled yes  Provider Appointment booked with Dr Silvio Pate on 01/20/2019.  Confirmed with patient if condition begins to worsen call PCP or go to the ER.  Patient was given the office number and encouraged to call back with question or concerns.  : yes

## 2019-01-19 ENCOUNTER — Other Ambulatory Visit: Payer: Self-pay

## 2019-01-19 ENCOUNTER — Ambulatory Visit (INDEPENDENT_AMBULATORY_CARE_PROVIDER_SITE_OTHER): Payer: 59 | Admitting: Physician Assistant

## 2019-01-19 ENCOUNTER — Encounter: Payer: Self-pay | Admitting: Physician Assistant

## 2019-01-19 VITALS — BP 160/84 | HR 58 | Ht 70.0 in | Wt 276.5 lb

## 2019-01-19 DIAGNOSIS — N186 End stage renal disease: Secondary | ICD-10-CM | POA: Diagnosis not present

## 2019-01-19 DIAGNOSIS — Z940 Kidney transplant status: Secondary | ICD-10-CM | POA: Diagnosis not present

## 2019-01-19 DIAGNOSIS — I272 Pulmonary hypertension, unspecified: Secondary | ICD-10-CM | POA: Diagnosis not present

## 2019-01-19 DIAGNOSIS — I2510 Atherosclerotic heart disease of native coronary artery without angina pectoris: Secondary | ICD-10-CM

## 2019-01-19 DIAGNOSIS — I10 Essential (primary) hypertension: Secondary | ICD-10-CM

## 2019-01-19 DIAGNOSIS — E785 Hyperlipidemia, unspecified: Secondary | ICD-10-CM | POA: Diagnosis not present

## 2019-01-19 DIAGNOSIS — I482 Chronic atrial fibrillation, unspecified: Secondary | ICD-10-CM

## 2019-01-19 DIAGNOSIS — R001 Bradycardia, unspecified: Secondary | ICD-10-CM | POA: Diagnosis not present

## 2019-01-19 DIAGNOSIS — I5042 Chronic combined systolic (congestive) and diastolic (congestive) heart failure: Secondary | ICD-10-CM | POA: Diagnosis not present

## 2019-01-19 MED ORDER — FUROSEMIDE 10 MG/ML IJ SOLN
40.0000 mg | Freq: Once | INTRAMUSCULAR | Status: AC
  Administered 2019-01-19: 40 mg via INTRAVENOUS

## 2019-01-19 NOTE — Patient Instructions (Signed)
Medication Instructions:  Your physician recommends that you continue on your current medications as directed. Please refer to the Current Medication list given to you today.  *If you need a refill on your cardiac medications before your next appointment, please call your pharmacy*  Lab Work: Your physician recommends that you have lab work today(BMET)  If you have labs (blood work) drawn today and your tests are completely normal, you will receive your results only by: Marland Kitchen MyChart Message (if you have MyChart) OR . A paper copy in the mail If you have any lab test that is abnormal or we need to change your treatment, we will call you to review the results.  Testing/Procedures: None ordered   Follow-Up: At [facility], you and your health needs are our priority.  As part of our continuing mission to provide you with exceptional heart care, we have created designated Provider Care Teams.  These Care Teams include your primary Cardiologist (physician) and Advanced Practice Providers (APPs -  Physician Assistants and Nurse Practitioners) who all work together to provide you with the care you need, when you need it.  Your next appointment:   2 weeks  The format for your next appointment:   In Person  Provider:    You may see [name], MD or one of the following Advanced Practice Providers on your designated Care Team:    Murray Hodgkins, NP  Christell Faith, PA-C  Marrianne Mood, PA-C

## 2019-01-20 ENCOUNTER — Telehealth: Payer: Self-pay

## 2019-01-20 ENCOUNTER — Encounter: Payer: Self-pay | Admitting: Internal Medicine

## 2019-01-20 ENCOUNTER — Ambulatory Visit (INDEPENDENT_AMBULATORY_CARE_PROVIDER_SITE_OTHER): Payer: 59 | Admitting: Internal Medicine

## 2019-01-20 ENCOUNTER — Ambulatory Visit (INDEPENDENT_AMBULATORY_CARE_PROVIDER_SITE_OTHER)
Admission: RE | Admit: 2019-01-20 | Discharge: 2019-01-20 | Disposition: A | Discharge status: Home / Self Care | Payer: 59 | Source: Ambulatory Visit | Attending: Internal Medicine | Admitting: Internal Medicine

## 2019-01-20 VITALS — BP 122/76 | HR 79 | Temp 98.3°F | Ht 70.0 in | Wt 273.0 lb

## 2019-01-20 DIAGNOSIS — I5032 Chronic diastolic (congestive) heart failure: Secondary | ICD-10-CM | POA: Diagnosis not present

## 2019-01-20 DIAGNOSIS — N401 Enlarged prostate with lower urinary tract symptoms: Secondary | ICD-10-CM | POA: Diagnosis not present

## 2019-01-20 DIAGNOSIS — N138 Other obstructive and reflux uropathy: Secondary | ICD-10-CM | POA: Diagnosis not present

## 2019-01-20 DIAGNOSIS — M25461 Effusion, right knee: Secondary | ICD-10-CM | POA: Diagnosis not present

## 2019-01-20 DIAGNOSIS — I2722 Pulmonary hypertension due to left heart disease: Secondary | ICD-10-CM

## 2019-01-20 DIAGNOSIS — M25561 Pain in right knee: Secondary | ICD-10-CM

## 2019-01-20 HISTORY — DX: Pulmonary hypertension due to left heart disease: I27.22

## 2019-01-20 LAB — BASIC METABOLIC PANEL
BUN/Creatinine Ratio: 32 — ABNORMAL HIGH (ref 9–20)
BUN: 47 mg/dL — ABNORMAL HIGH (ref 6–24)
CO2: 27 mmol/L (ref 20–29)
Calcium: 9.4 mg/dL (ref 8.7–10.2)
Chloride: 98 mmol/L (ref 96–106)
Creatinine, Ser: 1.46 mg/dL — ABNORMAL HIGH (ref 0.76–1.27)
GFR calc Af Amer: 60 mL/min/{1.73_m2} (ref >59)
GFR calc non Af Amer: 52 mL/min/{1.73_m2} — ABNORMAL LOW (ref >59)
Glucose: 411 mg/dL — ABNORMAL HIGH (ref 65–99)
Potassium: 4 mmol/L (ref 3.5–5.2)
Sodium: 138 mmol/L (ref 134–144)

## 2019-01-20 MED ORDER — TADALAFIL 20 MG PO TABS: 10.0000 mg | ORAL_TABLET | ORAL | 11 refills | Status: DC | PRN

## 2019-01-20 NOTE — Telephone Encounter (Signed)
-----  Message from [name], PA-C sent at 01/20/2019  8:22 AM EDT ----- Random glucose is significantly elevated.  Kidney function is slightly improved and stable, consistent with his baseline.  Potassium is at goal.   -No changes with diuresis.  -Follow up with PCP as previously directed regarding diabetes.

## 2019-01-20 NOTE — Progress Notes (Signed)
Subjective:    Patient ID: [name], male    DOB: 08-Jul-1960, 58 y.o.   MRN: [id number]  HPI Here for hospital follow up Reviewed hospital course and discharge summary Went to cardiology yesterday----highly detailed review of all the issues by Mr Idolina Primer  Scared by severe pressure "like someone on top of me" Did have some SOB on the way to the hospital Improved in the ER and resolved Echo showed improved EF but severe elevations of PA pressures  Seen by cardiology and got IV furosemide yesterday---- did diurese a lot (he lost 7# on his own scale) No more chest pain Breathing is okay Very little edema  Still having right knee pain Hard to walk due to pain at times Started 4-6 weeks ago--though no injury Seems to respond to the weather  Current Outpatient Medications on File Prior to Visit  Medication Sig Dispense Refill  . albuterol (PROVENTIL HFA;VENTOLIN HFA) 108 (90 Base) MCG/ACT inhaler Inhale 2 puffs every 6 (six) hours as needed into the lungs for wheezing or shortness of breath. 1 Inhaler 2  . amLODipine (NORVASC) 5 MG tablet Take 1 tablet (5 mg total) by mouth daily. 90 tablet 3  . beclomethasone (QVAR REDIHALER) 80 MCG/ACT inhaler Inhale 2 puffs into the lungs 2 (two) times daily. Rinse mouth after use. 1 Inhaler 2  . buPROPion (WELLBUTRIN SR) 150 MG 12 hr tablet TAKE 1 TABLET BY MOUTH TWICE DAILY (Patient taking differently: Take 150 mg by mouth 2 (two) times daily. ) 60 tablet 5  . colchicine 0.6 MG tablet Take 1 tablet (0.6 mg total) by mouth 2 (two) times daily as needed. (Patient taking differently: Take 0.6 mg by mouth 2 (two) times daily as needed (gout flares). ) 60 tablet 1  . ELIQUIS 5 MG TABS tablet TAKE 1 TABLET BY MOUTH TWICE DAILY (Patient taking differently: Take 5 mg by mouth 2 (two) times daily. ) 180 tablet 2  . fluticasone (FLONASE) 50 MCG/ACT nasal spray PLACE 2 SPRAYS INTO BOTH NOSTRILS DAILY. 16 g 0  . furosemide (LASIX) 40 MG tablet Take 1 tablet  (40 mg total) by mouth 2 (two) times daily. 30 tablet   . gabapentin (NEURONTIN) 300 MG capsule Take 300 mg by mouth at bedtime.     Marland Kitchen HUMALOG KWIKPEN 100 UNIT/ML KwikPen Inject 5-10 Units into the skin 3 (three) times daily.     . insulin glargine (LANTUS) 100 unit/mL SOPN Inject 0.1 mLs (10 Units total) into the skin at bedtime. Currently using 15 units (Patient taking differently: Inject 30 Units into the skin at bedtime. ) 15 mL 11  . lisinopril (ZESTRIL) 40 MG tablet TAKE 1 TABLET BY MOUTH ONCE DAILY (Patient taking differently: Take 40 mg by mouth daily. ) 90 tablet 3  . magnesium 30 MG tablet Take 30 mg by mouth daily.    . montelukast (SINGULAIR) 10 MG tablet TAKE 1 TABLET BY MOUTH AT BEDTIME (Patient taking differently: Take 10 mg by mouth at bedtime. ) 30 tablet 0  . Multiple Vitamin (MULTIVITAMIN WITH MINERALS) TABS tablet Take 1 tablet by mouth daily.    . mycophenolate (MYFORTIC) 180 MG EC tablet Take 360 mg by mouth 2 (two) times daily.     Marland Kitchen omeprazole (PRILOSEC) 20 MG capsule TAKE 1 CAPSULE BY MOUTH ONCE DAILY (Patient taking differently: Take 20 mg by mouth daily. ) 90 capsule 3  . potassium chloride (MICRO-K) 10 MEQ CR capsule TAKE 2 CAPSULES (20 MEQ TOTAL) BY MOUTH  2 (TWO) TIMES DAILY. 360 capsule 4  . rosuvastatin (CRESTOR) 10 MG tablet TAKE 1 TABLET BY MOUTH DAILY (Patient taking differently: Take 10 mg by mouth daily. ) 90 tablet 3  . tacrolimus (PROGRAF) 0.5 MG capsule Take 1 mg by mouth 2 (two) times daily.    . tadalafil (CIALIS) 20 MG tablet Take 0.5-1 tablets (10-20 mg total) by mouth every other day as needed for erectile dysfunction. 5 tablet 11  . tamsulosin (FLOMAX) 0.4 MG CAPS capsule Take 0.4 mg by mouth daily.     Marland Kitchen spironolactone (ALDACTONE) 25 MG tablet Take 1 tablet (25 mg total) by mouth daily. 30 tablet 11   No current facility-administered medications on file prior to visit.     Allergies  Allergen Reactions  . Morphine Itching  . Ibuprofen Other (See  Comments)    Due to kidney transplant    Past Medical History:  Diagnosis Date  . Allergy    seasonal  . Amnestic MCI (mild cognitive impairment with memory loss)   . Asthma   . Benign neoplasm of colon   . CAD (coronary artery disease) 2005   a.  Status post four-vessel CABG in 2005  . Chronic atrial fibrillation ([place])    a. CHADS2VASc 4 (CHF, HTN, DM, vascular disease); b. Eliquis  . Chronic combined systolic and diastolic CHF (congestive heart failure) ([place])    a.  TTE 7/18: EF 45-50%, moderately dilated LA, RVSF normal, PASP normal  . Chronic venous stasis dermatitis of both lower extremities   . Cytomegaloviral disease ([place]) 2017  . Depression   . Diabetes mellitus   . Diverticulosis   . Dyspnea   . Erectile dysfunction   . ESRD (end stage renal disease) ([place]) 2005   a. HD 2004-2012; b. S/p renal transplant in 2012  . GERD (gastroesophageal reflux disease)   . Gout   . Hyperlipidemia    low since renal failure  . Hypertension   . Kidney transplant status, cadaveric 2012  . Obesity   . Pneumonia   . Purpura ([place])   . Sleep apnea    bipap with oxygen 2 l  . Trigger finger   . Ulcer 05/2016   Left shin  . Wears glasses     Past Surgical History:  Procedure Laterality Date  . BARIATRIC SURGERY    . CARDIAC CATHETERIZATION     [facility]  . CATARACT EXTRACTION W/PHACO Right 10/29/2017   Procedure: CATARACT EXTRACTION PHACO AND INTRAOCULAR LENS PLACEMENT ([place])  RIGHT DIABETIC;  Surgeon: [name], MD;  Location: [facility];  Service: Ophthalmology;  Laterality: Right;  Diabetic - insulin  . CATARACT EXTRACTION W/PHACO Left 11/18/2017   Procedure: CATARACT EXTRACTION PHACO AND INTRAOCULAR LENS PLACEMENT ([place]) LEFT IVA/TOPICAL;  Surgeon: [name], MD;  Location: [facility];  Service: Ophthalmology;  Laterality: Left;  DIABETES - insulin sleep apnea  . COLONOSCOPY WITH PROPOFOL N/A 04/22/2013   Procedure: COLONOSCOPY WITH PROPOFOL;   Surgeon: [name], MD;  Location: [facility];  Service: Endoscopy;  Laterality: N/A;  . CORONARY ARTERY BYPASS GRAFT  2005   X 4  . DG ANGIO AV SHUNT*L*     right and left upper arms  . FASCIOTOMY  03/03/2012   Procedure: FASCIOTOMY;  Surgeon: [name], MD;  Location: [facility];  Service: Orthopedics;  Laterality: Right;  FASCIOTOMY RIGHT SMALL FINGER  . FASCIOTOMY Left 08/17/2013   Procedure: FASCIOTOMY LEFT RING;  Surgeon: [name], MD;  Location: [facility];  Service: Orthopedics;  Laterality: Left;  . INCISION AND DRAINAGE ABSCESS Left 10/15/2015   Procedure: INCISION AND DRAINAGE ABSCESS;  Surgeon: [name], MD;  Location: [facility];  Service: General;  Laterality: Left;  . KIDNEY TRANSPLANT  09/13/2010   cadaver--at [facility]  . RIGHT HEART CATH N/A 11/15/2016   Procedure: RIGHT HEART CATH;  Surgeon: [name], MD;  Location: [facility];  Service: Cardiovascular;  Laterality: N/A;  . TYMPANIC MEMBRANE REPAIR  1/12   left  . VASECTOMY      Family History  Problem Relation Age of Onset  . Heart disease Father   . Kidney failure Father   . Kidney disease Father   . Diabetes Maternal Grandmother   . Breast cancer Maternal Grandmother   . Valvular heart disease Mother   . Liver cancer Paternal Uncle   . Liver cancer Paternal Grandmother   . Prostate cancer Neg Hx     Social History   Socioeconomic History  . Marital status: Married    Spouse name: Not on file  . Number of children: 2  . Years of education: Not on file  . Highest education level: Not on file  Occupational History  . Occupation: Academic librarian: unemployed    Comment: disabled due to kidney failure  Social Needs  . Financial resource strain: Not on file  . Food insecurity    Worry: Not on file    Inability: Not on file  . Transportation needs    Medical: Not on file    Non-medical: Not on file  Tobacco Use  . Smoking  status: Former Smoker    Types: Cigars    Quit date: 09/02/1994    Years since quitting: 24.4  . Smokeless tobacco: Never Used  Substance and Sexual Activity  . Alcohol use: Yes    Alcohol/week: 0.0 - 1.0 standard drinks    Comment: occasional- 3 in the past year  . Drug use: No  . Sexual activity: Not on file  Lifestyle  . Physical activity    Days per week: Not on file    Minutes per session: Not on file  . Stress: Not on file  Relationships  . Social Herbalist on phone: Not on file    Gets together: Not on file    Attends religious service: Not on file    Active member of club or organization: Not on file    Attends meetings of clubs or organizations: Not on file    Relationship status: Not on file  . Intimate partner violence    Fear of current or ex partner: Not on file    Emotionally abused: Not on file    Physically abused: Not on file    Forced sexual activity: Not on file  Other Topics Concern  . Not on file  Social History Narrative   In school for culinary arts--- will finish 12/16      Has living will   Wife is health care POA   Would accept resuscitation attempts   Hasn't considered tube feedings   Review of Systems Sleeps in bed---flat. No PND Appetite is not great---eats some Happy with the tadalafil---77m ---helps him with sex but he just feels better! ("I feel more alive") Notes trouble finishing urinary stream---dribbles    Objective:   Physical Exam  Constitutional: No distress.  Cardiovascular: Normal heart sounds. Exam reveals no gallop.  No murmur heard. irregular  Respiratory: Effort normal and breath sounds normal. No respiratory distress. He has no wheezes. He has no rales.  Musculoskeletal:     Comments: No sig edema with support socks on No right knee effusion No ligament instability or meniscus findings. Slight catch with patellar ROM but full ROM  Neurological:  Normal weight bearing on right knee            Assessment & Plan:

## 2019-01-20 NOTE — Telephone Encounter (Signed)
Call to patient to discuss lab results and POC from Christell Faith, [place].   Pt verbalized understanding and will reach out to PCP for further BG mgmt.   Advised pt to call for any further questions or concerns.   No further orders at this time.

## 2019-01-20 NOTE — Assessment & Plan Note (Signed)
EF is better Did improve with IV lasix yesterday Unclear if CHF caused his severe but brief chest pain Stable now

## 2019-01-20 NOTE — Assessment & Plan Note (Signed)
Some increase in dribbling Frustrating The tadalafil should help

## 2019-01-20 NOTE — Assessment & Plan Note (Signed)
He has noticed feeling more "alive" when he takes the tadalafil I told him this is used for [facility], but I am not sure about when related to heart Will refer back to cardiology to decide if daily rx may be appropriate for him (usually 26m also)

## 2019-01-20 NOTE — Assessment & Plan Note (Signed)
Doesn't seem to be ligament or torn meniscus issue May have some OA and then mild injury Will check x-ray

## 2019-01-25 ENCOUNTER — Telehealth: Payer: Self-pay | Admitting: Internal Medicine

## 2019-01-25 DIAGNOSIS — M25561 Pain in right knee: Secondary | ICD-10-CM

## 2019-01-25 MED ORDER — TRAMADOL HCL 50 MG PO TABS: 50.0000 mg | ORAL_TABLET | Freq: Three times a day (TID) | ORAL | 0 refills | Status: DC | PRN

## 2019-01-25 NOTE — Telephone Encounter (Signed)
Spoke to pt. He would like to go ahead and see an orthopedist. Arrey would be better.   Also, asking for something for the pain. Having a hard time with steps. Send to [facility] pharmacy

## 2019-01-25 NOTE — Telephone Encounter (Signed)
Pt called wanting shannon to call him regarding xray results

## 2019-01-25 NOTE — Telephone Encounter (Signed)
Spoke to pt.

## 2019-01-25 NOTE — Telephone Encounter (Signed)
Let him know I sent a prescription and made the referral

## 2019-01-26 DIAGNOSIS — M25561 Pain in right knee: Secondary | ICD-10-CM | POA: Diagnosis not present

## 2019-01-26 DIAGNOSIS — G4733 Obstructive sleep apnea (adult) (pediatric): Secondary | ICD-10-CM | POA: Diagnosis not present

## 2019-01-26 DIAGNOSIS — E662 Morbid (severe) obesity with alveolar hypoventilation: Secondary | ICD-10-CM | POA: Diagnosis not present

## 2019-01-26 DIAGNOSIS — M1711 Unilateral primary osteoarthritis, right knee: Secondary | ICD-10-CM | POA: Diagnosis not present

## 2019-02-03 ENCOUNTER — Encounter: Payer: Self-pay | Admitting: Nurse Practitioner

## 2019-02-03 ENCOUNTER — Other Ambulatory Visit: Payer: Self-pay

## 2019-02-03 ENCOUNTER — Ambulatory Visit (INDEPENDENT_AMBULATORY_CARE_PROVIDER_SITE_OTHER): Payer: 59 | Admitting: Nurse Practitioner

## 2019-02-03 VITALS — BP 150/90 | HR 73 | Temp 98.2°F | Ht 71.0 in | Wt 273.2 lb

## 2019-02-03 DIAGNOSIS — I10 Essential (primary) hypertension: Secondary | ICD-10-CM

## 2019-02-03 DIAGNOSIS — I2510 Atherosclerotic heart disease of native coronary artery without angina pectoris: Secondary | ICD-10-CM

## 2019-02-03 DIAGNOSIS — I5032 Chronic diastolic (congestive) heart failure: Secondary | ICD-10-CM

## 2019-02-03 DIAGNOSIS — I2721 Secondary pulmonary arterial hypertension: Secondary | ICD-10-CM | POA: Diagnosis not present

## 2019-02-03 DIAGNOSIS — E785 Hyperlipidemia, unspecified: Secondary | ICD-10-CM

## 2019-02-03 DIAGNOSIS — Z940 Kidney transplant status: Secondary | ICD-10-CM

## 2019-02-03 MED ORDER — TADALAFIL (PAH) 20 MG PO TABS: 40.0000 mg | ORAL_TABLET | Freq: Every day | ORAL | 6 refills | Status: DC

## 2019-02-03 NOTE — Progress Notes (Signed)
Office Visit    Patient Name: [name] Date of Encounter: 02/03/2019  Primary Care Provider:  [name], MD Primary Cardiologist:  [name], MD  Chief Complaint    58 year old male with a history of CAD status post CABG in 2005 with nonischemic stress testing in January 2020, chronic combined systolic and diastolic congestive heart failure secondary ischemic cardiomyopathy with prior EF of 35 to 40% with subsequent normalization, permanent atrial fibrillation on Eliquis, pulmonary hypertension, end-stage renal disease previously on hemodialysis status post renal transplant in 2012, anemia of chronic disease, type 2 diabetes mellitus, hypertension, hyperlipidemia, beta-blocker induced bradycardia, obesity status post gastric bypass in September 2015, and obstructive sleep apnea on BiPAP, who presents for follow-up related to dyspnea and heart failure.  Past Medical History    Past Medical History:  Diagnosis Date   Allergy    seasonal   Amnestic MCI (mild cognitive impairment with memory loss)    Asthma    Benign neoplasm of colon    CAD (coronary artery disease) 2005   a.  Status post four-vessel CABG in 2005; b. 03/2018 MV: Small, fixed inferoapical and apical septal defect. No ischemia. EF 47% (60-65% by 05/2018 Echo).   Chronic atrial fibrillation ([place])    a. CHADS2VASc 4 (CHF, HTN, DM, vascular disease)--> Eliquis; b. 09/2018 [place]: Chronic Afib.   Chronic combined systolic and diastolic CHF (congestive heart failure) ([place])    a.  7/18 Echo: EF 45-50%; b. 05/2018 Echo: EF 60-65%, RVSP 74.8; c. 12/2018 Echo: EF 50-55%. Sev dil LA.   Chronic venous stasis dermatitis of both lower extremities    Cytomegaloviral disease ([place]) 2017   Depression    Diabetes mellitus    Diverticulosis    Dyspnea    Erectile dysfunction    ESRD (end stage renal disease) ([place]) 2005   a. HD 2004-2012; b. S/p renal transplant in 2012   GERD (gastroesophageal reflux  disease)    Gout    Hyperlipidemia    low since renal failure   Hypertension    Kidney transplant status, cadaveric 2012   Obesity    PAH (pulmonary artery hypertension) ([place])    a. 05/2018 Echo: RVSP 74.23mHg; b. 12/2018 RV not well visualized.   Pneumonia    Purpura ([place]    Sleep apnea    bipap with oxygen 2 l   Trigger finger    Ulcer 05/2016   Left shin   Wears glasses    Past Surgical History:  Procedure Laterality Date   BARIATRIC SURGERY     CARDIAC CATHETERIZATION     [facility]   CATARACT EXTRACTION W/PHACO Right 10/29/2017   Procedure: CATARACT EXTRACTION PHACO AND INTRAOCULAR LENS PLACEMENT ([place]  RIGHT DIABETIC;  Surgeon: BLeandrew Koyanagi MD;  [place] [facility]  Service: Ophthalmology;  Laterality: Right;  Diabetic - insulin   CATARACT EXTRACTION W/PHACO Left 11/18/2017   Procedure: CATARACT EXTRACTION PHACO AND INTRAOCULAR LENS PLACEMENT ([place] LEFT IVA/TOPICAL;  Surgeon: BLeandrew Koyanagi MD;  [facility]: [facility]  Service: Ophthalmology;  Laterality: Left;  DIABETES - insulin sleep apnea   COLONOSCOPY WITH PROPOFOL N/A 04/22/2013   Procedure: COLONOSCOPY WITH PROPOFOL;  Surgeon: DMilus Banister MD;  [facility];  Service: Endoscopy;  Laterality: N/A;   CORONARY ARTERY BYPASS GRAFT  2005   X 4   DG ANGIO AV SHUNT*L*     right and left upper arms   FASCIOTOMY  03/03/2012   Procedure: FASCIOTOMY;  Surgeon: GDominica Severin  Beola Cord, MD;  Location: [place];  Service: Orthopedics;  Laterality: Right;  FASCIOTOMY RIGHT SMALL FINGER   FASCIOTOMY Left 08/17/2013   Procedure: FASCIOTOMY LEFT RING;  Surgeon: [name], MD;  Location: [place];  Service: Orthopedics;  Laterality: Left;   INCISION AND DRAINAGE ABSCESS Left 10/15/2015   Procedure: INCISION AND DRAINAGE ABSCESS;  Surgeon: [name], MD;  Location: [facility];  Service: General;  Laterality: Left;   KIDNEY TRANSPLANT  09/13/2010    cadaver--at [facility] HEART CATH N/A 11/15/2016   Procedure: RIGHT HEART CATH;  Surgeon: [name], MD;  Location: [place] CV LAB;  Service: Cardiovascular;  Laterality: N/A;   TYMPANIC MEMBRANE REPAIR  1/12   left   VASECTOMY      Allergies  Allergies  Allergen Reactions   Morphine Itching   Ibuprofen Other (See Comments)    Due to kidney transplant    History of Present Illness    58 year old male with a history of CAD status post CABG in 2005 with nonischemic stress testing in January 2020, chronic combined systolic and diastolic congestive heart failure secondary ischemic cardiomyopathy with prior EF of 35 to 40% with subsequent normalization, permanent atrial fibrillation on Eliquis, pulmonary hypertension, end-stage renal disease previously on hemodialysis status post renal transplant in 2012, anemia of chronic disease, type 2 diabetes mellitus, hypertension, hyperlipidemia, beta-blocker induced bradycardia, obesity status post gastric bypass in September 2015, and obstructive sleep apnea on BiPAP.  As noted above, he has a history of four-vessel bypass in 2005 with nonischemic stress testing in January 2020.  EF previously as low as 35 to 40% however, in July 2018, EF was found to be 45 to 50% with normal RV systolic function and normal PA pressures.  In August 2018, right heart catheterization was performed in the setting of ongoing volume overload requiring admission, showing a PA pressure of 94/38 (59), and a wedge of 31 despite several days of diuresis prior to catheterization.  Patient eventually did improve with additional IV diuresis.  Follow-up echocardiography in March 2020 showed normal LV function with severe pulmonary hypertension and an RVSP of 74.8 mmHg.  In May 2020, he underwent event monitoring in the setting of intermittent dizziness.  This showed persistent atrial fibrillation without significant bradycardia with the exception of rates down to  the 30s and 40s during periods of sleep.  No indication for pacing.  He was most recently admitted October 23 for chest pain with trivial high-sensitivity troponin elevation (28  26).  Chest x-ray showed pulmonary vascular congestion.  EKG showed rate controlled A. fib without acute changes.  He was placed on Lasix 40 mg twice daily.  Echocardiogram during admission showed EF of 50 to 55% with severe biatrial enlargement.  The RV was not well seen and degree of pulmonary hypertension was unable to be calculated.  At follow-up on October 27, weight was up 10 to 15 pounds and he noted mild lower extremity swelling with significant abdominal distention.  He was given Lasix 40 mg IV x1.  Since then, his weight is down 3 pounds on our scale.  His weight has been trending to 67 on his home scale and he notes a dry weight of roughly 262-263.  He has been feeling better and notes that he went for a 2 mile walk in the woods with his wife yesterday and tolerated it quite well.  His biggest limiting factor was hip pain related  to arthritis but he was not experiencing any chest pain or dyspnea.  He has noted some abdominal bloating but overall this is improved.  He has not had any significant lower extremity edema denies palpitations, PND, orthopnea, dizziness, syncope, or early satiety.  We did discuss using taldalafil 40 mg daily in the setting of pulmonary hypertension and is he is interested in pursuing this as he has previously noted that when he uses taldalafil for ED, he notes improved breathing.  Home Medications    Prior to Admission medications   Medication Sig Start Date End Date Taking? Authorizing Provider  albuterol (PROVENTIL HFA;VENTOLIN HFA) 108 (90 Base) MCG/ACT inhaler Inhale 2 puffs every 6 (six) hours as needed into the lungs for wheezing or shortness of breath. 02/10/17   [name], MD  amLODipine (NORVASC) 5 MG tablet Take 1 tablet (5 mg total) by mouth daily. 12/17/18   [name],  MD  beclomethasone (QVAR REDIHALER) 80 MCG/ACT inhaler Inhale 2 puffs into the lungs 2 (two) times daily. Rinse mouth after use. 06/15/18 06/15/19  Laverle Hobby, MD  buPROPion (WELLBUTRIN SR) 150 MG 12 hr tablet TAKE 1 TABLET BY MOUTH TWICE DAILY Patient taking differently: Take 150 mg by mouth 2 (two) times daily.  09/03/18   [name], MD  colchicine 0.6 MG tablet Take 1 tablet (0.6 mg total) by mouth 2 (two) times daily as needed. Patient taking differently: Take 0.6 mg by mouth 2 (two) times daily as needed (gout flares).  03/30/18   [name], MD  ELIQUIS 5 MG TABS tablet TAKE 1 TABLET BY MOUTH TWICE DAILY Patient taking differently: Take 5 mg by mouth 2 (two) times daily.  12/31/18   [name], MD  fluticasone (FLONASE) 50 MCG/ACT nasal spray PLACE 2 SPRAYS INTO BOTH NOSTRILS DAILY. 01/01/19   [name], MD  furosemide (LASIX) 40 MG tablet Take 1 tablet (40 mg total) by mouth 2 (two) times daily. 01/16/19   Loletha Grayer, MD  gabapentin (NEURONTIN) 300 MG capsule Take 300 mg by mouth at bedtime.  12/01/18 12/01/19  [provider]  HUMALOG KWIKPEN 100 UNIT/ML KwikPen Inject 5-10 Units into the skin 3 (three) times daily.  09/01/18   [provider]  insulin glargine (LANTUS) 100 unit/mL SOPN Inject 0.1 mLs (10 Units total) into the skin at bedtime. Currently using 15 units Patient taking differently: Inject 30 Units into the skin at bedtime.  11/16/16   Fritzi Mandes, MD  lisinopril (ZESTRIL) 40 MG tablet TAKE 1 TABLET BY MOUTH ONCE DAILY Patient taking differently: Take 40 mg by mouth daily.  08/19/18   Alisa Graff, FNP  magnesium 30 MG tablet Take 30 mg by mouth daily.    [provider]  montelukast (SINGULAIR) 10 MG tablet TAKE 1 TABLET BY MOUTH AT BEDTIME Patient taking differently: Take 10 mg by mouth at bedtime.  06/01/18   Laverle Hobby, MD  Multiple Vitamin (MULTIVITAMIN WITH MINERALS) TABS tablet Take 1 tablet by mouth  daily.    [provider]  mycophenolate (MYFORTIC) 180 MG EC tablet Take 360 mg by mouth 2 (two) times daily.     [provider]  omeprazole (PRILOSEC) 20 MG capsule TAKE 1 CAPSULE BY MOUTH ONCE DAILY Patient taking differently: Take 20 mg by mouth daily.  02/21/16   [name], MD  potassium chloride (MICRO-K) 10 MEQ CR capsule TAKE 2 CAPSULES (20 MEQ TOTAL) BY MOUTH 2 (TWO) TIMES DAILY. 05/18/18  Darylene Price A, FNP  rosuvastatin (CRESTOR) 10 MG tablet TAKE 1 TABLET BY MOUTH DAILY Patient taking differently: Take 10 mg by mouth daily.  06/09/18   [name], MD  spironolactone (ALDACTONE) 25 MG tablet Take 1 tablet (25 mg total) by mouth daily. 11/16/16 01/19/19  Fritzi Mandes, MD  tacrolimus (PROGRAF) 0.5 MG capsule Take 1 mg by mouth 2 (two) times daily.    [provider]  tadalafil (CIALIS) 20 MG tablet Take 0.5-1 tablets (10-20 mg total) by mouth every other day as needed for erectile dysfunction. 01/20/19   [name], MD  tamsulosin (FLOMAX) 0.4 MG CAPS capsule Take 0.4 mg by mouth daily.     [provider]  traMADol (ULTRAM) 50 MG tablet Take 1 tablet (50 mg total) by mouth 3 (three) times daily as needed. 01/25/19   [name], MD    Review of Systems    Noted good exercise tolerance yesterday.  Still with mild abdominal distention but denies chest pain, dyspnea, PND, orthopnea, dizziness, syncope, edema, or early satiety.  All other systems reviewed and are otherwise negative except as noted above.  Physical Exam    VS:  BP (!) 150/90 (BP Location: Right Arm, Patient Position: Sitting, Cuff Size: Large) Comment (BP Location): unable to use left arm   Pulse 73    Temp 98.2 F (36.8 C)    Ht _0  (1.803 m)    Wt 273 lb 4 oz (123.9 kg)    SpO2 95%    BMI 38.11 kg/m  , BMI Body mass index is 38.11 kg/m. GEN: Well nourished, well developed, in no acute distress. HEENT: normal. Neck: Supple, JVP approximately 12 cm.  No  carotid bruits, or masses. Cardiac: Irregularly irregular with a 2/6 systolic murmur heard loudest at the left lower sternal border, no rubs, or gallops. No clubbing, cyanosis, trace bilateral anterior tibial edema.  Radials/PT 2+ and equal bilaterally.  Respiratory:  Respirations regular and unlabored, clear to auscultation bilaterally. GI: Obese, soft, nontender, nondistended, BS + x 4. MS: no deformity or atrophy. Skin: warm and dry, no rash. Neuro:  Strength and sensation are intact. Psych: Normal affect.  Accessory Clinical Findings    ECG personally reviewed by me today -atrial fibrillation, 73, right axis deviation, poor R wave progression, ivcd - no acute changes.  Lab Results  Component Value Date   WBC 5.4 01/15/2019   HGB 10.8 (L) 01/15/2019   HCT 35.3 (L) 01/15/2019   MCV 74.6 (L) 01/15/2019   PLT 137 (L) 01/15/2019   Lab Results  Component Value Date   CREATININE 1.46 (H) 01/19/2019   BUN 47 (H) 01/19/2019   NA 138 01/19/2019   K 4.0 01/19/2019   CL 98 01/19/2019   CO2 27 01/19/2019   Lab Results  Component Value Date   ALT 11 01/16/2019   AST 14 (L) 01/16/2019   ALKPHOS 87 01/16/2019   BILITOT 0.7 01/16/2019   Lab Results  Component Value Date   CHOL 89 10/09/2017   HDL 34 (L) 03/06/2016   LDLCALC 49 10/09/2017   LDLDIRECT 41.9 04/20/2012   TRIG 73 03/06/2016   CHOLHDL 2.7 03/06/2016     Assessment & Plan    1.  Chronic combined systolic and diastolic congestive heart failure/pulmonary arterial hypertension: Patient has been doing better since his last visit here on October 27.  He did require a dose of IV Lasix at that time.  His weight is down 3  pounds from that visit on our scale.  His weight has been trending at 267 pounds on his home scale which is about 5 pounds above his dry weight.  He has noted improvement in exercise tolerance over the past 2 weeks and was able to hike 2 miles with his wife yesterday without any significant limitations.  On  examination, he has trace lower extremity edema.  His abdomen is soft today and neck veins are elevated approximately 12 cm in the setting of pulmonary hypertension.  As he has previously noted improvement in respiratory symptoms when he uses as needed taldalafil, I am going to prescribe taldalafil 88m daily for management of pulmonary hypertension.  As I am adding this today, I will not adjust diuretics or antihypertensives at this time in order to prevent hypotension.  2.  Coronary artery disease: Status post prior bypass surgery in 2005 with a low risk stress test in January 2020.  He denies any chest pain or dyspnea at this time.  He remains on statin therapy.  No beta-blocker in the setting of prior history of bradycardia.  No aspirin in the setting of ongoing Eliquis therapy.  3.  Permanent atrial fibrillation: This is well rate controlled in the absence of an AV nodal blocking agent.  CHA2DS2-VASc equals 4 and he remains on Eliquis therapy.  4.  History of end-stage renal disease: Status post renal transplant with stable creatinine of 1.46 at last evaluation October 27.  No change to diuretic dosing today.  5.  Intermittent bradycardia: Monitoring early this year showed no significant bradycardic events.  6.  Hyperlipidemia: Remains on low-dose Crestor therapy with an LDL of 49 last July.  7.  Essential hypertension: Blood pressure is elevated today at 150/90.  As I am adding daily taldalfil, I will not make any other adjustments in order to avoid hypotension and right to left shunting.  8.  Disposition:  F/u in clinic in 1 month or sooner if necessary.   CMurray Hodgkins NP 02/03/2019, 11:52 AM

## 2019-02-03 NOTE — Patient Instructions (Addendum)
Medication Instructions:  - Your physician has recommended you make the following change in your medication:   1) Start Taldalafil 20 mg- take 2 tablets (40 mg) by mouth once daily  *If you need a refill on your cardiac medications before your next appointment, please call your pharmacy*  Lab Work: - none ordered  If you have labs (blood work) drawn today and your tests are completely normal, you will receive your results only by: Marland Kitchen MyChart Message (if you have MyChart) OR . A paper copy in the mail If you have any lab test that is abnormal or we need to change your treatment, we will call you to review the results.  Testing/Procedures: - none ordered  Follow-Up: At [facility], you and your health needs are our priority.  As part of our continuing mission to provide you with exceptional heart care, we have created designated Provider Care Teams.  These Care Teams include your primary Cardiologist (physician) and Advanced Practice Providers (APPs -  Physician Assistants and Nurse Practitioners) who all work together to provide you with the care you need, when you need it.  Your next appointment:   1 month  The format for your next appointment:   In Person  Provider:   [name], MD (only per Ignacia Bayley, NP)  Other Instructions - n/a

## 2019-02-10 ENCOUNTER — Telehealth: Payer: Self-pay

## 2019-02-10 ENCOUNTER — Telehealth: Payer: Self-pay | Admitting: Cardiovascular Disease

## 2019-02-10 NOTE — Telephone Encounter (Signed)
Patient pharmacy [employer] employee pharmacy is needing PA for Cialis .   Please call patient when approved

## 2019-02-10 NOTE — Telephone Encounter (Signed)
PA started through Covermymeds.  Awaiting decision.

## 2019-02-10 NOTE — Telephone Encounter (Signed)
PA started through covermymeds.   Syncere Buis Key: [id number] - PA Case ID: [id number] - Rx #: [id number]

## 2019-02-12 ENCOUNTER — Telehealth: Payer: Self-pay | Admitting: Cardiovascular Disease

## 2019-02-12 NOTE — Telephone Encounter (Addendum)
Spoke with [name]) at [place] the prior authorization for Tadalafil 20 mg has been approved from 02/10/2019 to 02/09/2020. Prior Auth Ref Number: [id number]

## 2019-02-12 NOTE — Telephone Encounter (Signed)
Spoke with the patient. Our CMA's have been working on the patients PA for tadalafil and may have left a message regarding. Advised the patient that PA has been approved and his pharmacy has been notified. Patient voiced appreciation for the assistance.

## 2019-02-12 NOTE — Telephone Encounter (Signed)
Patient is returnin the nurse's call.

## 2019-02-23 DIAGNOSIS — M720 Palmar fascial fibromatosis [Dupuytren]: Secondary | ICD-10-CM | POA: Diagnosis not present

## 2019-02-25 ENCOUNTER — Other Ambulatory Visit: Payer: Self-pay | Admitting: Internal Medicine

## 2019-02-25 DIAGNOSIS — E662 Morbid (severe) obesity with alveolar hypoventilation: Secondary | ICD-10-CM | POA: Diagnosis not present

## 2019-02-25 DIAGNOSIS — G4733 Obstructive sleep apnea (adult) (pediatric): Secondary | ICD-10-CM | POA: Diagnosis not present

## 2019-03-02 DIAGNOSIS — M720 Palmar fascial fibromatosis [Dupuytren]: Secondary | ICD-10-CM | POA: Diagnosis not present

## 2019-03-09 ENCOUNTER — Ambulatory Visit: Payer: 59 | Admitting: Cardiovascular Disease

## 2019-03-15 DIAGNOSIS — M720 Palmar fascial fibromatosis [Dupuytren]: Secondary | ICD-10-CM | POA: Diagnosis not present

## 2019-03-16 DIAGNOSIS — Z760 Encounter for issue of repeat prescription: Secondary | ICD-10-CM | POA: Diagnosis not present

## 2019-03-24 DIAGNOSIS — I5032 Chronic diastolic (congestive) heart failure: Secondary | ICD-10-CM | POA: Diagnosis not present

## 2019-03-24 DIAGNOSIS — E1165 Type 2 diabetes mellitus with hyperglycemia: Secondary | ICD-10-CM | POA: Diagnosis not present

## 2019-03-24 DIAGNOSIS — E8779 Other fluid overload: Secondary | ICD-10-CM | POA: Diagnosis not present

## 2019-03-24 DIAGNOSIS — B258 Other cytomegaloviral diseases: Secondary | ICD-10-CM | POA: Diagnosis not present

## 2019-03-24 DIAGNOSIS — Z794 Long term (current) use of insulin: Secondary | ICD-10-CM | POA: Diagnosis not present

## 2019-03-24 DIAGNOSIS — I5023 Acute on chronic systolic (congestive) heart failure: Secondary | ICD-10-CM | POA: Diagnosis not present

## 2019-03-24 DIAGNOSIS — D84821 Immunodeficiency due to drugs: Secondary | ICD-10-CM | POA: Diagnosis not present

## 2019-03-24 DIAGNOSIS — I517 Cardiomegaly: Secondary | ICD-10-CM | POA: Diagnosis not present

## 2019-03-24 DIAGNOSIS — I4891 Unspecified atrial fibrillation: Secondary | ICD-10-CM | POA: Diagnosis not present

## 2019-03-24 DIAGNOSIS — Z6841 Body Mass Index (BMI) 40.0 and over, adult: Secondary | ICD-10-CM | POA: Diagnosis not present

## 2019-03-24 DIAGNOSIS — E872 Acidosis: Secondary | ICD-10-CM | POA: Diagnosis not present

## 2019-03-24 DIAGNOSIS — I509 Heart failure, unspecified: Secondary | ICD-10-CM | POA: Diagnosis not present

## 2019-03-24 DIAGNOSIS — Z940 Kidney transplant status: Secondary | ICD-10-CM | POA: Diagnosis not present

## 2019-03-24 DIAGNOSIS — I110 Hypertensive heart disease with heart failure: Secondary | ICD-10-CM | POA: Diagnosis not present

## 2019-03-24 DIAGNOSIS — I482 Chronic atrial fibrillation, unspecified: Secondary | ICD-10-CM | POA: Diagnosis not present

## 2019-03-24 DIAGNOSIS — I272 Pulmonary hypertension, unspecified: Secondary | ICD-10-CM | POA: Diagnosis not present

## 2019-03-24 DIAGNOSIS — Z79899 Other long term (current) drug therapy: Secondary | ICD-10-CM | POA: Diagnosis not present

## 2019-03-24 DIAGNOSIS — E119 Type 2 diabetes mellitus without complications: Secondary | ICD-10-CM | POA: Diagnosis not present

## 2019-03-24 DIAGNOSIS — I4510 Unspecified right bundle-branch block: Secondary | ICD-10-CM | POA: Diagnosis not present

## 2019-03-24 DIAGNOSIS — Z7901 Long term (current) use of anticoagulants: Secondary | ICD-10-CM | POA: Diagnosis not present

## 2019-03-24 DIAGNOSIS — D8989 Other specified disorders involving the immune mechanism, not elsewhere classified: Secondary | ICD-10-CM | POA: Diagnosis not present

## 2019-03-24 DIAGNOSIS — I5043 Acute on chronic combined systolic (congestive) and diastolic (congestive) heart failure: Secondary | ICD-10-CM | POA: Diagnosis not present

## 2019-03-24 DIAGNOSIS — E876 Hypokalemia: Secondary | ICD-10-CM | POA: Diagnosis not present

## 2019-03-26 DIAGNOSIS — Z6841 Body Mass Index (BMI) 40.0 and over, adult: Secondary | ICD-10-CM | POA: Diagnosis not present

## 2019-03-26 DIAGNOSIS — G4733 Obstructive sleep apnea (adult) (pediatric): Secondary | ICD-10-CM | POA: Diagnosis not present

## 2019-03-26 DIAGNOSIS — E876 Hypokalemia: Secondary | ICD-10-CM | POA: Diagnosis not present

## 2019-03-26 DIAGNOSIS — Z7901 Long term (current) use of anticoagulants: Secondary | ICD-10-CM | POA: Diagnosis not present

## 2019-03-26 DIAGNOSIS — Z940 Kidney transplant status: Secondary | ICD-10-CM | POA: Diagnosis not present

## 2019-03-26 DIAGNOSIS — E662 Morbid (severe) obesity with alveolar hypoventilation: Secondary | ICD-10-CM | POA: Diagnosis not present

## 2019-03-26 DIAGNOSIS — I110 Hypertensive heart disease with heart failure: Secondary | ICD-10-CM | POA: Diagnosis not present

## 2019-03-26 DIAGNOSIS — E872 Acidosis: Secondary | ICD-10-CM | POA: Diagnosis not present

## 2019-03-26 DIAGNOSIS — Z794 Long term (current) use of insulin: Secondary | ICD-10-CM | POA: Diagnosis not present

## 2019-03-26 DIAGNOSIS — D84821 Immunodeficiency due to drugs: Secondary | ICD-10-CM | POA: Diagnosis not present

## 2019-03-26 DIAGNOSIS — Z79899 Other long term (current) drug therapy: Secondary | ICD-10-CM | POA: Diagnosis not present

## 2019-03-26 DIAGNOSIS — I482 Chronic atrial fibrillation, unspecified: Secondary | ICD-10-CM | POA: Diagnosis not present

## 2019-03-26 DIAGNOSIS — E1165 Type 2 diabetes mellitus with hyperglycemia: Secondary | ICD-10-CM | POA: Diagnosis not present

## 2019-03-26 DIAGNOSIS — E119 Type 2 diabetes mellitus without complications: Secondary | ICD-10-CM | POA: Diagnosis not present

## 2019-03-26 DIAGNOSIS — I5023 Acute on chronic systolic (congestive) heart failure: Secondary | ICD-10-CM | POA: Diagnosis not present

## 2019-03-26 DIAGNOSIS — I5043 Acute on chronic combined systolic (congestive) and diastolic (congestive) heart failure: Secondary | ICD-10-CM | POA: Diagnosis not present

## 2019-03-26 DIAGNOSIS — I272 Pulmonary hypertension, unspecified: Secondary | ICD-10-CM | POA: Diagnosis not present

## 2019-03-26 DIAGNOSIS — I4891 Unspecified atrial fibrillation: Secondary | ICD-10-CM | POA: Diagnosis not present

## 2019-03-28 MED ORDER — ROSUVASTATIN CALCIUM 5 MG PO TABS
10.00 | ORAL_TABLET | ORAL | Status: DC
Start: 2019-03-29 — End: 2019-03-28

## 2019-03-28 MED ORDER — PHENYLEPHRINE-GUAIFENESIN 30-400 MG PO CP12
10.00 | ORAL_CAPSULE | ORAL | Status: DC
Start: 2019-03-29 — End: 2019-03-28

## 2019-03-28 MED ORDER — APIXABAN 5 MG PO TABS
5.00 | ORAL_TABLET | ORAL | Status: DC
Start: 2019-03-28 — End: 2019-03-28

## 2019-03-28 MED ORDER — INSULIN LISPRO 100 UNIT/ML ~~LOC~~ SOLN
2.00 | SUBCUTANEOUS | Status: DC
Start: 2019-03-28 — End: 2019-03-28

## 2019-03-28 MED ORDER — STRI-DEX MAXIMUM STRENGTH 2 % EX PADS
125.00 | MEDICATED_PAD | CUTANEOUS | Status: DC
Start: ? — End: 2019-03-28

## 2019-03-28 MED ORDER — CROMOLYN SODIUM (ANTI-INFLAMMATORY AGENTS)
540.00 | Status: DC
Start: 2019-03-28 — End: 2019-03-28

## 2019-03-28 MED ORDER — SPIRONOLACTONE 25 MG PO TABS
25.00 | ORAL_TABLET | ORAL | Status: DC
Start: 2019-03-29 — End: 2019-03-28

## 2019-03-28 MED ORDER — FUROSEMIDE 40 MG PO TABS
80.00 | ORAL_TABLET | ORAL | Status: DC
Start: 2019-03-29 — End: 2019-03-28

## 2019-03-28 MED ORDER — LISINOPRIL 20 MG PO TABS
40.00 | ORAL_TABLET | ORAL | Status: DC
Start: 2019-03-29 — End: 2019-03-28

## 2019-03-28 MED ORDER — SULFAMETHOXAZOLE-TRIMETHOPRIM 400-80 MG PO TABS
1.00 | ORAL_TABLET | ORAL | Status: DC
Start: 2019-03-29 — End: 2019-03-28

## 2019-03-28 MED ORDER — FERROSOFERRIC OXIDE
150.00 | Status: DC
Start: 2019-03-28 — End: 2019-03-28

## 2019-03-28 MED ORDER — PANTOPRAZOLE SODIUM 40 MG PO TBEC
40.00 | DELAYED_RELEASE_TABLET | ORAL | Status: DC
Start: 2019-03-29 — End: 2019-03-28

## 2019-03-28 MED ORDER — TACROLIMUS 1 MG PO CAPS
2.00 | ORAL_CAPSULE | ORAL | Status: DC
Start: 2019-03-28 — End: 2019-03-28

## 2019-03-28 MED ORDER — TADALAFIL (PAH) 20 MG PO TABS
40.00 | ORAL_TABLET | ORAL | Status: DC
Start: 2019-03-29 — End: 2019-03-28

## 2019-03-28 MED ORDER — GABAPENTIN 300 MG PO CAPS
300.00 | ORAL_CAPSULE | ORAL | Status: DC
Start: 2019-03-28 — End: 2019-03-28

## 2019-03-28 MED ORDER — TAMSULOSIN HCL 0.4 MG PO CAPS
0.40 | ORAL_CAPSULE | ORAL | Status: DC
Start: 2019-03-29 — End: 2019-03-28

## 2019-03-28 MED ORDER — INSULIN GLARGINE 100 UNIT/ML SOLOSTAR PEN
7.00 | PEN_INJECTOR | SUBCUTANEOUS | Status: DC
Start: 2019-03-28 — End: 2019-03-28

## 2019-03-28 MED ORDER — GLUCOSE 40 % PO GEL
15.00 | ORAL | Status: DC
Start: ? — End: 2019-03-28

## 2019-03-28 MED ORDER — IPRATROPIUM-ALBUTEROL 0.5-2.5 (3) MG/3ML IN SOLN
3.00 | RESPIRATORY_TRACT | Status: DC
Start: ? — End: 2019-03-28

## 2019-03-29 ENCOUNTER — Other Ambulatory Visit: Payer: Self-pay | Admitting: Internal Medicine

## 2019-03-30 DIAGNOSIS — I5042 Chronic combined systolic (congestive) and diastolic (congestive) heart failure: Secondary | ICD-10-CM | POA: Diagnosis not present

## 2019-03-30 DIAGNOSIS — D509 Iron deficiency anemia, unspecified: Secondary | ICD-10-CM | POA: Diagnosis not present

## 2019-03-30 DIAGNOSIS — E876 Hypokalemia: Secondary | ICD-10-CM | POA: Diagnosis not present

## 2019-03-30 DIAGNOSIS — Z9989 Dependence on other enabling machines and devices: Secondary | ICD-10-CM | POA: Diagnosis not present

## 2019-03-30 DIAGNOSIS — Z79899 Other long term (current) drug therapy: Secondary | ICD-10-CM | POA: Diagnosis not present

## 2019-03-30 DIAGNOSIS — I110 Hypertensive heart disease with heart failure: Secondary | ICD-10-CM | POA: Diagnosis not present

## 2019-03-30 DIAGNOSIS — Z6841 Body Mass Index (BMI) 40.0 and over, adult: Secondary | ICD-10-CM | POA: Diagnosis not present

## 2019-03-30 DIAGNOSIS — I2723 Pulmonary hypertension due to lung diseases and hypoxia: Secondary | ICD-10-CM | POA: Diagnosis not present

## 2019-03-30 DIAGNOSIS — I2510 Atherosclerotic heart disease of native coronary artery without angina pectoris: Secondary | ICD-10-CM | POA: Diagnosis not present

## 2019-03-30 DIAGNOSIS — Z951 Presence of aortocoronary bypass graft: Secondary | ICD-10-CM | POA: Diagnosis not present

## 2019-03-30 DIAGNOSIS — I480 Paroxysmal atrial fibrillation: Secondary | ICD-10-CM | POA: Diagnosis not present

## 2019-03-30 DIAGNOSIS — N2581 Secondary hyperparathyroidism of renal origin: Secondary | ICD-10-CM | POA: Diagnosis not present

## 2019-03-30 DIAGNOSIS — D72818 Other decreased white blood cell count: Secondary | ICD-10-CM | POA: Diagnosis not present

## 2019-03-30 DIAGNOSIS — Z940 Kidney transplant status: Secondary | ICD-10-CM | POA: Diagnosis not present

## 2019-03-30 DIAGNOSIS — Z4822 Encounter for aftercare following kidney transplant: Secondary | ICD-10-CM | POA: Diagnosis not present

## 2019-03-30 DIAGNOSIS — Z5181 Encounter for therapeutic drug level monitoring: Secondary | ICD-10-CM | POA: Diagnosis not present

## 2019-03-30 DIAGNOSIS — Z7901 Long term (current) use of anticoagulants: Secondary | ICD-10-CM | POA: Diagnosis not present

## 2019-03-30 DIAGNOSIS — M109 Gout, unspecified: Secondary | ICD-10-CM | POA: Insufficient documentation

## 2019-03-30 DIAGNOSIS — N400 Enlarged prostate without lower urinary tract symptoms: Secondary | ICD-10-CM | POA: Diagnosis not present

## 2019-03-30 DIAGNOSIS — R809 Proteinuria, unspecified: Secondary | ICD-10-CM | POA: Diagnosis not present

## 2019-03-30 DIAGNOSIS — Z792 Long term (current) use of antibiotics: Secondary | ICD-10-CM | POA: Diagnosis not present

## 2019-03-30 DIAGNOSIS — I10 Essential (primary) hypertension: Secondary | ICD-10-CM | POA: Diagnosis not present

## 2019-03-30 DIAGNOSIS — G4733 Obstructive sleep apnea (adult) (pediatric): Secondary | ICD-10-CM | POA: Diagnosis not present

## 2019-03-31 ENCOUNTER — Other Ambulatory Visit: Payer: Self-pay | Admitting: *Deleted

## 2019-03-31 ENCOUNTER — Encounter: Payer: Self-pay | Admitting: *Deleted

## 2019-03-31 NOTE — Patient Outreach (Addendum)
[facility]) Care Management  03/31/2019  [name] [birth year] [id number]   Transition of care call/case closure   Referral received:03/31/19 Initial outreach:03/31/19 Insurance: [employer] [employer]    Subjective: Initial successful telephone call to patient's preferred number in order to complete transition of care assessment; 2 HIPAA identifiers verified. Explained purpose of call and completed transition of care assessment.  Patient reports driving now and request return call later today.  1245 Successful return call to patient   He states that he is okay just feeling a little tired , states that he just found out that his iron level is low. He discussed attending post discharge visit with nephrology on today and has been ordered iron. He denies shortness cough , swelling , reports that he continues to monitor weights daily , and today's weight is 272.5, no sudden weight gain increase, his hospital discharge weight was 273. He verbalizes daily fluid restrictions of 64 ounces and less than 2 grams of sodium understanding. He verifies understanding action plan for heart failure sudden weight parameters , metolazone action for weight gain of 3 lbs in one day  and notifying MD. He states  tolerating diet, denies bowel or bladder problems. He discussed missing cardiology visit in December but plans to reschedule. He reports being independent with care at home , with support.   He discussed history of chronic conditions of Diabetes (lasted noted A1c 10/1 on 11/24/18), heart failure and hypertension , Discussed [place] chronic condition management programs and is agreeable to referral if not already enrolled.  He says being unsure if wife has hospital indemnity plan but she would have called to file claim.  [facility]  He denies educational needs related to staying safe during the COVID 19 pandemic.  Patient discussed reason  loss of his mother to Covid 53 in November, and his brother is currently in hospital with conditions. Discussed [place] benefit of employee assistance program for counseling support, he declined contact information at this time, states he is getting good support from his priest.    Objective:  [name] was hospitalized at [facility]  From 03/24/19 - 03/28/19  For Heart Failure  Comorbidities include: Type 2 Diabetes, OSA with Bipap at night , Hypertension, CABG in 2004, Pulmonary hypertension , Heart failure, Atrial Fib, s/p Kidney transplant 2012.   He/She was discharged to home on 03/28/19 without the need for home health services or DME.   Assessment:  Patient voices good understanding of all discharge instructions.  See transition of care flowsheet for assessment details.   Plan:  Reviewed hospital discharge diagnosis of Heart Failure   and discharge treatment plan using hospital discharge instructions, assessing medication adherence, reviewing problems requiring provider notification, and discussing the importance of follow up with surgeon, primary care provider and/or specialists as directed. Reviewed [place] healthy lifestyle program information to keep premiums low for 2022:  Step 1: Get annual physical between March 25, 2018 and September 23, 2019; Step 2: Complete your health assessment between March 26, 2019 and November 24, 2019 at [email or web address] Step 3:Identify your current health status and complete the corresponding action step between January 1, and November 24, 2019.   Using [place] website, verified that patient is an active participate in [place]'s Active Health Management chronic disease management program, will verify with Active Health Management sent email to Adele Dan at Active health management .  Patient agreeable to enrolling if not already.   No ongoing care management needs identified so  will close case to [place] Management services and route successful outreach letter with [place] Management pamphlet and 24 Hour Nurse Line Magnet to [place] Management clinical pool to be mailed to patient's home address.   Joylene Draft, RN, [place] Management Coordinator  [phone]- Mobile [phone]- Toll Free Main Office

## 2019-04-05 ENCOUNTER — Other Ambulatory Visit: Payer: Self-pay | Admitting: Cardiovascular Disease

## 2019-04-05 NOTE — Telephone Encounter (Signed)
Pt's age 59, wt 123.9 kg, SCr 1.46, CrCl 95.47, last ov w/ Carl Bayley, NP on 02/03/19.

## 2019-04-05 NOTE — Telephone Encounter (Signed)
Refill Request.

## 2019-04-06 DIAGNOSIS — Z794 Long term (current) use of insulin: Secondary | ICD-10-CM | POA: Diagnosis not present

## 2019-04-06 DIAGNOSIS — M65311 Trigger thumb, right thumb: Secondary | ICD-10-CM | POA: Diagnosis not present

## 2019-04-06 DIAGNOSIS — E1142 Type 2 diabetes mellitus with diabetic polyneuropathy: Secondary | ICD-10-CM | POA: Diagnosis not present

## 2019-04-06 DIAGNOSIS — E669 Obesity, unspecified: Secondary | ICD-10-CM | POA: Diagnosis not present

## 2019-04-06 DIAGNOSIS — E1169 Type 2 diabetes mellitus with other specified complication: Secondary | ICD-10-CM | POA: Diagnosis not present

## 2019-04-06 DIAGNOSIS — I5032 Chronic diastolic (congestive) heart failure: Secondary | ICD-10-CM | POA: Diagnosis not present

## 2019-04-06 DIAGNOSIS — E113293 Type 2 diabetes mellitus with mild nonproliferative diabetic retinopathy without macular edema, bilateral: Secondary | ICD-10-CM | POA: Diagnosis not present

## 2019-04-06 DIAGNOSIS — Z9289 Personal history of other medical treatment: Secondary | ICD-10-CM | POA: Diagnosis not present

## 2019-04-06 DIAGNOSIS — I10 Essential (primary) hypertension: Secondary | ICD-10-CM | POA: Diagnosis not present

## 2019-04-06 DIAGNOSIS — E1165 Type 2 diabetes mellitus with hyperglycemia: Secondary | ICD-10-CM | POA: Diagnosis not present

## 2019-04-07 ENCOUNTER — Ambulatory Visit (INDEPENDENT_AMBULATORY_CARE_PROVIDER_SITE_OTHER): Payer: 59 | Admitting: Internal Medicine

## 2019-04-07 ENCOUNTER — Encounter: Payer: Self-pay | Admitting: Internal Medicine

## 2019-04-07 ENCOUNTER — Other Ambulatory Visit: Payer: Self-pay

## 2019-04-07 DIAGNOSIS — I482 Chronic atrial fibrillation, unspecified: Secondary | ICD-10-CM | POA: Diagnosis not present

## 2019-04-07 DIAGNOSIS — I5032 Chronic diastolic (congestive) heart failure: Secondary | ICD-10-CM | POA: Diagnosis not present

## 2019-04-07 DIAGNOSIS — M1000 Idiopathic gout, unspecified site: Secondary | ICD-10-CM

## 2019-04-07 MED ORDER — COLCHICINE 0.6 MG PO TABS: 0.6000 mg | ORAL_TABLET | Freq: Two times a day (BID) | ORAL | 11 refills | Status: DC | PRN

## 2019-04-07 NOTE — Patient Instructions (Signed)
Please check with the transplant team about taking allopurinol daily to prevent the gout. If they don't think you should take this, you can try the colchicine daily as a preventative.

## 2019-04-07 NOTE — Assessment & Plan Note (Signed)
Recent exacerbation and hospitalization Now will take the metolazone any time his weight goes up Discussed that this can affect his kidney function though---but still needs to keep the fluid off Will see cardiology later this week

## 2019-04-07 NOTE — Assessment & Plan Note (Signed)
Recurrent spells  Asked him to check with transplant team to see if he can take allopurinol Otherwise, may want to use the colchicine daily as preventative

## 2019-04-07 NOTE — Progress Notes (Signed)
Subjective:    Patient ID: [name], male    DOB: [birth year], 59 y.o.   MRN: [id number]  HPI Here for hospital follow up Full hospital records not visualized--but reviewed recent transplant and endocrine follow up notes  Seen in transplant clinic 12/30 Had SOB and couldn't eat or drink due to bloat Hospitalized and given IV furosemide Lost about 8# in hospital  Now back at his baseline weight at home Stays on furosemide 80 bid If weight up 3#--takes metolazone 2.52m Still has some increased DOE from his baseline  No chest pain No dizziness or syncope  Current Outpatient Medications on File Prior to Visit  Medication Sig Dispense Refill  . albuterol (PROVENTIL HFA;VENTOLIN HFA) 108 (90 Base) MCG/ACT inhaler Inhale 2 puffs every 6 (six) hours as needed into the lungs for wheezing or shortness of breath. 1 Inhaler 2  . amLODipine (NORVASC) 5 MG tablet Take 1 tablet (5 mg total) by mouth daily. (Patient taking differently: Take 10 mg by mouth daily. ) 90 tablet 3  . apixaban (ELIQUIS) 5 MG TABS tablet Take 1 tablet (5 mg total) by mouth 2 (two) times daily. 180 tablet 1  . beclomethasone (QVAR REDIHALER) 80 MCG/ACT inhaler Inhale 2 puffs into the lungs 2 (two) times daily. Rinse mouth after use. 1 Inhaler 2  . buPROPion (WELLBUTRIN SR) 150 MG 12 hr tablet TAKE 1 TABLET BY MOUTH TWICE DAILY 180 tablet 3  . colchicine 0.6 MG tablet Take 1 tablet (0.6 mg total) by mouth 2 (two) times daily as needed. (Patient taking differently: Take 0.6 mg by mouth 2 (two) times daily as needed (gout flares). ) 60 tablet 1  . fluticasone (FLONASE) 50 MCG/ACT nasal spray PLACE 2 SPRAYS INTO BOTH NOSTRILS DAILY. 16 g 0  . furosemide (LASIX) 40 MG tablet Take 2 tablets by mouth 2 (two) times daily.    .Marland Kitchengabapentin (NEURONTIN) 300 MG capsule Take 300 mg by mouth at bedtime.     .Marland KitchenHUMALOG KWIKPEN 100 UNIT/ML KwikPen Inject 5-10 Units into the skin 3 (three) times daily.     . insulin glargine (LANTUS)  100 unit/mL SOPN Inject 0.1 mLs (10 Units total) into the skin at bedtime. Currently using 15 units (Patient taking differently: Inject 30 Units into the skin at bedtime. ) 15 mL 11  . lisinopril (ZESTRIL) 40 MG tablet TAKE 1 TABLET BY MOUTH ONCE DAILY (Patient taking differently: Take 40 mg by mouth daily. ) 90 tablet 3  . magnesium 30 MG tablet Take 30 mg by mouth daily.    . montelukast (SINGULAIR) 10 MG tablet TAKE 1 TABLET BY MOUTH AT BEDTIME (Patient taking differently: Take 10 mg by mouth at bedtime. ) 30 tablet 0  . Multiple Vitamin (MULTIVITAMIN WITH MINERALS) TABS tablet Take 1 tablet by mouth daily.    . mycophenolate (MYFORTIC) 180 MG EC tablet Take 360 mg by mouth 2 (two) times daily.     .Marland Kitchenomeprazole (PRILOSEC) 20 MG capsule TAKE 1 CAPSULE BY MOUTH ONCE DAILY (Patient taking differently: Take 20 mg by mouth daily. ) 90 capsule 3  . potassium chloride (MICRO-K) 10 MEQ CR capsule TAKE 2 CAPSULES (20 MEQ TOTAL) BY MOUTH 2 (TWO) TIMES DAILY. (Patient taking differently: Take 20 mEq by mouth daily. ) 360 capsule 4  . rosuvastatin (CRESTOR) 10 MG tablet TAKE 1 TABLET BY MOUTH DAILY (Patient taking differently: Take 10 mg by mouth daily. ) 90 tablet 3  . spironolactone (ALDACTONE) 25 MG tablet Take  1 tablet (25 mg total) by mouth daily. 30 tablet 11  . tacrolimus (PROGRAF) 0.5 MG capsule Take 1 mg by mouth 2 (two) times daily.    . tadalafil (CIALIS) 20 MG tablet Take 0.5-1 tablets (10-20 mg total) by mouth every other day as needed for erectile dysfunction. 15 tablet 11  . tadalafil, PAH, (ADCIRCA) 20 MG tablet Take 2 tablets (40 mg total) by mouth daily. 60 tablet 6  . tamsulosin (FLOMAX) 0.4 MG CAPS capsule Take 0.4 mg by mouth daily.     . traMADol (ULTRAM) 50 MG tablet Take 1 tablet (50 mg total) by mouth 3 (three) times daily as needed. 20 tablet 0   No current facility-administered medications on file prior to visit.    Allergies  Allergen Reactions  . Morphine Itching  .  Acetaminophen Other (See Comments)    BC of transplant  . Ibuprofen Other (See Comments)    Due to kidney transplant    Past Medical History:  Diagnosis Date  . Allergy    seasonal  . Amnestic MCI (mild cognitive impairment with memory loss)   . Asthma   . Benign neoplasm of colon   . CAD (coronary artery disease) 2005   a.  Status post four-vessel CABG in 2005; b. 03/2018 MV: Small, fixed inferoapical and apical septal defect. No ischemia. EF 47% (60-65% by 05/2018 Echo).  . Chronic atrial fibrillation ([place])    a. CHADS2VASc 4 (CHF, HTN, DM, vascular disease)--> Eliquis; b. 09/2018 Zio: Chronic Afib.  . Chronic combined systolic and diastolic CHF (congestive heart failure) ([place])    a.  7/18 Echo: EF 45-50%; b. 05/2018 Echo: EF 60-65%, RVSP 74.8; c. 12/2018 Echo: EF 50-55%. Sev dil LA.  Marland Kitchen Chronic venous stasis dermatitis of both lower extremities   . Cytomegaloviral disease ([place]) 2017  . Depression   . Diabetes mellitus   . Diverticulosis   . Dyspnea   . Erectile dysfunction   . ESRD (end stage renal disease) ([place]) 2005   a. HD 2004-2012; b. S/p renal transplant in 2012  . GERD (gastroesophageal reflux disease)   . Gout   . Hyperlipidemia    low since renal failure  . Hypertension   . Kidney transplant status, cadaveric 2012  . Obesity   . PAH (pulmonary artery hypertension) ([place])    a. 05/2018 Echo: RVSP 74.56mHg; b. 12/2018 RV not well visualized.  . Pneumonia   . [place]   . Sleep apnea    bipap with oxygen 2 l  . Trigger finger   . Ulcer 05/2016   Left shin  . Wears glasses     Past Surgical History:  Procedure Laterality Date  . BARIATRIC SURGERY    . CARDIAC CATHETERIZATION     [facility]  . CATARACT EXTRACTION W/PHACO Right 10/29/2017   Procedure: CATARACT EXTRACTION PHACO AND INTRAOCULAR LENS PLACEMENT ([place]  RIGHT DIABETIC;  Surgeon: BLeandrew Koyanagi MD;  [place]  Service: Ophthalmology;  Laterality: Right;  Diabetic - insulin  .  CATARACT EXTRACTION W/PHACO Left 11/18/2017   Procedure: CATARACT EXTRACTION PHACO AND INTRAOCULAR LENS PLACEMENT ([place] LEFT IVA/TOPICAL;  Surgeon: BLeandrew Koyanagi MD;  [place]  Service: Ophthalmology;  Laterality: Left;  DIABETES - insulin sleep apnea  . COLONOSCOPY WITH PROPOFOL N/A 04/22/2013   Procedure: COLONOSCOPY WITH PROPOFOL;  Surgeon: DMilus Banister MD;  Location: [facility];  Service: Endoscopy;  Laterality: N/A;  . CORONARY ARTERY BYPASS GRAFT  2005   X  4  . DG ANGIO AV SHUNT*L*     right and left upper arms  . FASCIOTOMY  03/03/2012   Procedure: FASCIOTOMY;  Surgeon: [name], MD;  Location: [place];  Service: Orthopedics;  Laterality: Right;  FASCIOTOMY RIGHT SMALL FINGER  . FASCIOTOMY Left 08/17/2013   Procedure: FASCIOTOMY LEFT RING;  Surgeon: [name], MD;  Location: [place];  Service: Orthopedics;  Laterality: Left;  . INCISION AND DRAINAGE ABSCESS Left 10/15/2015   Procedure: INCISION AND DRAINAGE ABSCESS;  Surgeon: [name], MD;  Location: [facility];  Service: General;  Laterality: Left;  . KIDNEY TRANSPLANT  09/13/2010   cadaver--at [facility]  . RIGHT HEART CATH N/A 11/15/2016   Procedure: RIGHT HEART CATH;  Surgeon: [name], MD;  Location: [facility];  Service: Cardiovascular;  Laterality: N/A;  . TYMPANIC MEMBRANE REPAIR  1/12   left  . VASECTOMY      Family History  Problem Relation Age of Onset  . Heart disease Father   . Kidney failure Father   . Kidney disease Father   . Diabetes Maternal Grandmother   . Breast cancer Maternal Grandmother   . Valvular heart disease Mother   . Liver cancer Paternal Uncle   . Liver cancer Paternal Grandmother   . Prostate cancer Neg Hx     Social History   Socioeconomic History  . Marital status: Married    Spouse name: Not on file  . Number of children: 2  . Years of education: Not on file  . Highest education level: Not  on file  Occupational History  . Occupation: Academic librarian: unemployed    Comment: disabled due to kidney failure  Tobacco Use  . Smoking status: Former Smoker    Types: Cigars    Quit date: 09/02/1994    Years since quitting: 24.6  . Smokeless tobacco: Never Used  Substance and Sexual Activity  . Alcohol use: Yes    Alcohol/week: 0.0 - 1.0 standard drinks    Comment: occasional- 3 in the past year  . Drug use: No  . Sexual activity: Not on file  Other Topics Concern  . Not on file  Social History Narrative   In school for culinary arts      Has living will   Wife is health care POA   Would accept resuscitation attempts   Hasn't considered tube feedings   Social Determinants of Health   Financial Resource Strain:   . Difficulty of Paying Living Expenses: Not on file  Food Insecurity:   . Worried About Charity fundraiser in the Last Year: Not on file  . Ran Out of Food in the Last Year: Not on file  Transportation Needs:   . Lack of Transportation (Medical): Not on file  . Lack of Transportation (Non-Medical): Not on file  Physical Activity:   . Days of Exercise per Week: Not on file  . Minutes of Exercise per Session: Not on file  Stress:   . Feeling of Stress : Not on file  Social Connections:   . Frequency of Communication with Friends and Family: Not on file  . Frequency of Social Gatherings with Friends and Family: Not on file  . Attends Religious Services: Not on file  . Active Member of Clubs or Organizations: Not on file  . Attends Archivist Meetings: Not on file  . Marital Status: Not on file  Intimate Partner Violence:   . Fear of Current or Ex-Partner: Not on file  . Emotionally Abused: Not on file  . Physically Abused: Not on file  . Sexually Abused: Not on file   Review of Systems Hasn't slept well recently--gout in elbow Colchicine helps--but takes a while Eating better now    Objective:   Physical Exam  Neck:  No thyromegaly present.  Cardiovascular: Normal heart sounds.  No murmur heard. Slightly irregular  Respiratory: Effort normal. No respiratory distress. He has no wheezes. He has no rales.  Decreased breath sounds but clear  Musculoskeletal:        General: No edema.     Comments: Mild tenderness at right elbow  Lymphadenopathy:    He has no cervical adenopathy.           Assessment & Plan:

## 2019-04-07 NOTE — Assessment & Plan Note (Signed)
Rate is fine On eliquis

## 2019-04-09 ENCOUNTER — Telehealth (INDEPENDENT_AMBULATORY_CARE_PROVIDER_SITE_OTHER): Payer: 59 | Admitting: Physician Assistant

## 2019-04-09 ENCOUNTER — Other Ambulatory Visit: Payer: Self-pay

## 2019-04-09 ENCOUNTER — Encounter: Payer: Self-pay | Admitting: Physician Assistant

## 2019-04-09 VITALS — Ht 70.5 in | Wt 268.0 lb

## 2019-04-09 DIAGNOSIS — I5032 Chronic diastolic (congestive) heart failure: Secondary | ICD-10-CM | POA: Diagnosis not present

## 2019-04-09 DIAGNOSIS — E1121 Type 2 diabetes mellitus with diabetic nephropathy: Secondary | ICD-10-CM

## 2019-04-09 DIAGNOSIS — I252 Old myocardial infarction: Secondary | ICD-10-CM

## 2019-04-09 DIAGNOSIS — I2510 Atherosclerotic heart disease of native coronary artery without angina pectoris: Secondary | ICD-10-CM

## 2019-04-09 DIAGNOSIS — R001 Bradycardia, unspecified: Secondary | ICD-10-CM

## 2019-04-09 DIAGNOSIS — N186 End stage renal disease: Secondary | ICD-10-CM

## 2019-04-09 DIAGNOSIS — I4821 Permanent atrial fibrillation: Secondary | ICD-10-CM

## 2019-04-09 DIAGNOSIS — I2721 Secondary pulmonary arterial hypertension: Secondary | ICD-10-CM

## 2019-04-09 DIAGNOSIS — Z794 Long term (current) use of insulin: Secondary | ICD-10-CM

## 2019-04-09 DIAGNOSIS — G4733 Obstructive sleep apnea (adult) (pediatric): Secondary | ICD-10-CM

## 2019-04-09 DIAGNOSIS — Z940 Kidney transplant status: Secondary | ICD-10-CM

## 2019-04-09 DIAGNOSIS — I10 Essential (primary) hypertension: Secondary | ICD-10-CM

## 2019-04-09 DIAGNOSIS — E785 Hyperlipidemia, unspecified: Secondary | ICD-10-CM

## 2019-04-09 NOTE — Progress Notes (Signed)
Virtual Visit via Telephone Note   This visit type was conducted due to national recommendations for restrictions regarding the COVID-19 Pandemic (e.g. social distancing) in an effort to limit this patient's exposure and mitigate transmission in our community.  Due to his co-morbid illnesses, this patient is at least at moderate risk for complications without adequate follow up.  This format is felt to be most appropriate for this patient at this time.  The patient did not have access to video technology/had technical difficulties with video requiring transitioning to audio format only (telephone).  All issues noted in this document were discussed and addressed.  No physical exam could be performed with this format.  Please refer to the patient's chart for his  consent to telehealth for [name].   Date:  04/09/2019   ID:  [name], [name] [birth year], MRN [id number]  Patient Location: Home Provider Location: Office  PCP:  [name], MD  Cardiologist:  [name], MD  Electrophysiologist:  None   Evaluation Performed:  Follow-Up Visit  Chief Complaint:   59 y.o. male with history of CAD s/p CABG in 2005 with nonischemic stress testing in January 2020, chronic combined systolic and diastolic congestive heart failure 2/2 ICM with prior EF of 35 to 40% with subsequent normalization, permanent atrial fibrillation on Eliquis, pulmonary hypertension, ESRD previously on hemodialysis s/p renal transplant in 2012, anemia of chronic disease, type 2 diabetes mellitus, hypertension, hyperlipidemia, bradycardia 2/2 beta-blocker, obesity s/p P gastric bypass in September 2015, and obstructive sleep apnea on BiPAP, and who presents for follow-up related to dyspnea and heart failure.  History of Present Illness:    [name] is a 59 y.o. male with PMH as above and who presents for follow-up related to dyspnea and heart failure.  He has a history of end-stage renal disease s/p DDRT  08/2010. He underwent biopsy 05/2015 for increased proteinuria which revealed FSGS and diabetic glomerulopathy. He follows with nephrology at [name]. He has a history of four-vessel bypass in 2005 with a nonischemic stress testing in January 2020.  EF previously low at 35 to 40%; however, in July 2018, EF was found to be 45 to 50% with normal RV systolic function and normal PA pressures.  In August 2018, right heart catheterization was performed in the setting of ongoing volume overload requiring admission, showing PA pressure of 94/38 (59), and a wedge pressure of 31 despite several days of diuresis prior to catheterization.  Eventually, patient did improve on IV diuresis.  Follow-up echo 05/2018 showed normal LV function with severe pulmonary hypertension and RVSP of 74.8 mmHg.  In May 2020, he underwent event monitoring in the set of intermittent dizziness.  This showed persistent atrial fibrillation without significant bradycardia with the exception of rates down in the 30s and 40s during periods of sleep.  No indication for pacing.  He was admitted October 23 for chest pain with trivial high-sensitivity troponin elevation.  He was placed on Lasix 40 mg twice daily.  Echo during admission showed EF 50 to 55% with severe biatrial enlargement  The RV was not well seen and degree of pulmonary hypertension was unable to be calculated.    At follow-up on October 27, weight was up 10 to 15 pounds and he noted mild bilateral lower extremity edema with significant abdominal distention.  He was given Lasix 40 mg IV x1. After that time, notes indicate his weight was down 3 pounds on the clinic scale.  His  weight was trending 267 on his home scale and he noted a dry weight of roughly 262 to 263 pounds.  As of his last visit, he was feeling well and went for 2 mile walk in the woods with his wife and tolerated it well.  He felt his biggest limiting factor was hip pain related to arthritis but was not experiencing any  chest pain or dyspnea.  He noted some abdominal bloating but felt this was improved. Taldalafil 40 mg daily was discussed in the setting of pulmonary hypertension, and with patient expressing interest, this was added to his medications.   On 03/24/2019, the patient was admitted for acute exacerbation of chronic combined left and right heart failure.  He presented to [facility] with report of increased abdominal distention, dyspnea on exertion, orthopnea, PND, and progressive bilateral peripheral edema.  He reported taking Lasix 40 mg p.o. in the a.m. and 80 mg p.o. in the p.m.  At admission, his weight was 279 pounds.  BNP on admission was 119.  03/25/2019 echo was performed and showed moderate LVE, mild LVH, low normal LV SF with EF 50 to 55%, abnormal septal wall motion, moderate LAE, mild RAE, mild aortic stenosis, and estimated RA pressure 15 mmHg.  EKG showed Afib, RBBB with QRS 130m. He was IV diuresed with IV Lasix 120 mg twice daily for 2 days then transition to 80 mg p.o. Lasix the day prior to discharge.  He was discharged on a similar regimen with metolazone as needed for 3 pound weight gain in a day.  Discharge weight was 273 pounds.  Given increasing blood pressure, his home dose of amlodipine was increased from 5 mg to 10 mg.  BP at discharge was 143/87.  Baseline creatinine appeared 1.2-1.4. He was discharged home on spironolactone 231mdaily. increased dose amlodipine 1066maily, increased dose Lasix 80 mg twice daily, potassium 10 M EQ, Eliquis 5 mg twice daily, lisinopril 40 mg, rosuvastatin 10 mg daily, tadalafil, and tamsulosin. It was recommended he stop his inhalers, including albuterol.   He was seen by nephrology in early March 30, 2019 with recommendation to start iron supplementation.  It was noted he was weighing himself daily with weight stable and had not needed to take his metolazone. Documented home weight was 272 lbs. It was noted his home BP cuff had  broken. It was felt the patient was euvolemic on exam though elevated BP was noted. CBC showed leukopenia with recommendation to repeat labs in 1 month, and if leukopenia persisted, decrease MPA dose.  He was seen by endocrinology 04/06/19 with notes indicating that his diabetes remained uncontrolled and complicated by diabetic retinopathy. It was also noted his weight was up 14lbs from his previous visit. His BP was noted to be controlled though no documented pressures on review of vitals.   Since that time, the patient reports worsening dyspnea on exertion.  Specifically, he notes that he becomes short of breath when walking back from a store after a recent air and and will often need to rest up to 5 minutes in his car before feeling back to his normal self.  In addition, he reports paroxysmal nocturnal dyspnea despite using his BiPAP at night for his OSA.  He denies any CP He is asymptomatic in his Afib and denies any racing HR or palpitations. He continues to use his BiPAP; however, he reports waking short of breath despite this use.  He does admit to the occasional cigar but  otherwise reports not tobacco use. He reports that he weighs himself daily and actually has noted that he is approximately 10 lbs under his discharge weight ([id number]) from that of [facility] discharge. He has noted both abdominal distention and bilateral lower extremity edema. He reports that he usually takes his blood pressure; however, his cuff has not been working properly lately and gives labile pressures including 150/130. The cuff is a wrist cuff. He reports medication compliance on those medications as outlined above. He takes 40m BID and metolazone 2.556mif weight up 3 lbs from baseline. No s/sx of acute bleeding.   The patient does have symptoms concerning for COVID-19 infection (fever, chills, cough, or new shortness of breath) though this is likely related to his pulmonary edema in the setting of known pulmonary HTN with  worsening volume status as outlined above in the HPI. Discussed staying safe and the 3 Ws, as well as the importance of social distancing.    Past Medical History:  Diagnosis Date  . Allergy    seasonal  . Amnestic MCI (mild cognitive impairment with memory loss)   . Asthma   . Benign neoplasm of colon   . CAD (coronary artery disease) 2005   a.  Status post four-vessel CABG in 2005; b. 03/2018 MV: Small, fixed inferoapical and apical septal defect. No ischemia. EF 47% (60-65% by 05/2018 Echo).  . Chronic atrial fibrillation (HCGray   a. CHADS2VASc 4 (CHF, HTN, DM, vascular disease)--> Eliquis; b. 09/2018 Zio: Chronic Afib.  . Chronic combined systolic and diastolic CHF (congestive heart failure) (HCU[place]   a.  7/18 Echo: EF 45-50%; b. 05/2018 Echo: EF 60-65%, RVSP 74.8; c. 12/2018 Echo: EF 50-55%. Sev dil LA.  . Marland Kitchenhronic venous stasis dermatitis of both lower extremities   . Cytomegaloviral disease (HCFirth2017  . Depression   . Diabetes mellitus   . Diverticulosis   . Dyspnea   . Erectile dysfunction   . ESRD (end stage renal disease) (HCAthens2005   a. HD 2004-2012; b. S/p renal transplant in 2012  . GERD (gastroesophageal reflux disease)   . Gout   . Hyperlipidemia    low since renal failure  . Hypertension   . Kidney transplant status, cadaveric 2012  . Obesity   . PAH (pulmonary artery hypertension) (HCMar[place]   a. 05/2018 Echo: RVSP 74.71m32m; b. 12/2018 RV not well visualized.  . Pneumonia   . Purpura (HCCMount Auburn . Sleep apnea    bipap with oxygen 2 l  . Trigger finger   . Ulcer 05/2016   Left shin  . Wears glasses    Past Surgical History:  Procedure Laterality Date  . BARIATRIC SURGERY    . CARDIAC CATHETERIZATION     [facility]  . CATARACT EXTRACTION W/PHACO Right 10/29/2017   Procedure: CATARACT EXTRACTION PHACO AND INTRAOCULAR LENS PLACEMENT (IOCHillsboroRIGHT DIABETIC;  Surgeon: BraLeandrew KoyanagiD;  [place]: Ophthalmology;  Laterality: Right;  Diabetic  - insulin  . CATARACT EXTRACTION W/PHACO Left 11/18/2017   Procedure: CATARACT EXTRACTION PHACO AND INTRAOCULAR LENS PLACEMENT (IOCBirminghamEFT IVA/TOPICAL;  Surgeon: BraLeandrew KoyanagiD;  Location: MEBOpalService: Ophthalmology;  Laterality: Left;  DIABETES - insulin sleep apnea  . COLONOSCOPY WITH PROPOFOL N/A 04/22/2013   Procedure: COLONOSCOPY WITH PROPOFOL;  Surgeon: DanMilus BanisterD;  [facility];  Service: Endoscopy;  Laterality: N/A;  . CORONARY ARTERY BYPASS GRAFT  2005   X 4  .  DG ANGIO AV SHUNT*L*     right and left upper arms  . FASCIOTOMY  03/03/2012   Procedure: FASCIOTOMY;  Surgeon: [name], MD;  Location: [place];  Service: Orthopedics;  Laterality: Right;  FASCIOTOMY RIGHT SMALL FINGER  . FASCIOTOMY Left 08/17/2013   Procedure: FASCIOTOMY LEFT RING;  Surgeon: [name], MD;  Location: [place];  Service: Orthopedics;  Laterality: Left;  . INCISION AND DRAINAGE ABSCESS Left 10/15/2015   Procedure: INCISION AND DRAINAGE ABSCESS;  Surgeon: [name], MD;  Location: [facility] ORS;  Service: General;  Laterality: Left;  . KIDNEY TRANSPLANT  09/13/2010   cadaver--at [facility]  . RIGHT HEART CATH N/A 11/15/2016   Procedure: RIGHT HEART CATH;  Surgeon: [name], MD;  Location: [place] CV LAB;  Service: Cardiovascular;  Laterality: N/A;  . TYMPANIC MEMBRANE REPAIR  1/12   left  . VASECTOMY       Current Meds  Medication Sig  . albuterol (PROVENTIL HFA;VENTOLIN HFA) 108 (90 Base) MCG/ACT inhaler Inhale 2 puffs every 6 (six) hours as needed into the lungs for wheezing or shortness of breath.  Marland Kitchen amLODipine (NORVASC) 5 MG tablet Take 1 tablet (5 mg total) by mouth daily.  Marland Kitchen apixaban (ELIQUIS) 5 MG TABS tablet Take 1 tablet (5 mg total) by mouth 2 (two) times daily.  . beclomethasone (QVAR REDIHALER) 80 MCG/ACT inhaler Inhale 2 puffs into the lungs 2 (two) times daily. Rinse mouth after use. (Patient taking  differently: Inhale 2 puffs into the lungs as needed. Rinse mouth after use.)  . buPROPion (WELLBUTRIN SR) 150 MG 12 hr tablet TAKE 1 TABLET BY MOUTH TWICE DAILY  . colchicine 0.6 MG tablet Take 1 tablet (0.6 mg total) by mouth 2 (two) times daily as needed (gout flares).  . ferrous sulfate 325 (65 FE) MG EC tablet Take 325 mg by mouth 2 (two) times daily.  . fluticasone (FLONASE) 50 MCG/ACT nasal spray PLACE 2 SPRAYS INTO BOTH NOSTRILS DAILY.  . furosemide (LASIX) 80 MG tablet Take 80 mg by mouth 2 (two) times daily.  Marland Kitchen gabapentin (NEURONTIN) 300 MG capsule Take 300 mg by mouth at bedtime.   Marland Kitchen HUMALOG KWIKPEN 100 UNIT/ML KwikPen Inject 5-10 Units into the skin 3 (three) times daily.   . insulin glargine (LANTUS) 100 unit/mL SOPN Inject 0.1 mLs (10 Units total) into the skin at bedtime. Currently using 15 units (Patient taking differently: Inject 30 Units into the skin at bedtime. )  . lisinopril (ZESTRIL) 40 MG tablet TAKE 1 TABLET BY MOUTH ONCE DAILY (Patient taking differently: Take 40 mg by mouth daily. )  . metolazone (ZAROXOLYN) 2.5 MG tablet Take 2.5 mg by mouth daily as needed.  . montelukast (SINGULAIR) 10 MG tablet TAKE 1 TABLET BY MOUTH AT BEDTIME (Patient taking differently: Take 10 mg by mouth at bedtime. )  . Multiple Vitamin (MULTIVITAMIN WITH MINERALS) TABS tablet Take 1 tablet by mouth daily.  . mycophenolate (MYFORTIC) 180 MG EC tablet Take 360 mg by mouth 2 (two) times daily.   Marland Kitchen omeprazole (PRILOSEC) 20 MG capsule TAKE 1 CAPSULE BY MOUTH ONCE DAILY (Patient taking differently: Take 20 mg by mouth daily. )  . potassium chloride (MICRO-K) 10 MEQ CR capsule TAKE 2 CAPSULES (20 MEQ TOTAL) BY MOUTH 2 (TWO) TIMES DAILY.  . rosuvastatin (CRESTOR) 10 MG tablet TAKE 1 TABLET BY MOUTH DAILY (Patient taking differently: Take 10 mg by mouth daily. )  . spironolactone (ALDACTONE) 25  MG tablet Take 1 tablet (25 mg total) by mouth daily.  . tacrolimus (PROGRAF) 1 MG capsule Take 1 mg by mouth  2 (two) times daily.  . tadalafil, PAH, (ADCIRCA) 20 MG tablet Take 2 tablets (40 mg total) by mouth daily.  . tamsulosin (FLOMAX) 0.4 MG CAPS capsule Take 0.4 mg by mouth daily.   . traMADol (ULTRAM) 50 MG tablet Take 1 tablet (50 mg total) by mouth 3 (three) times daily as needed.     Allergies:   Morphine, Acetaminophen, and Ibuprofen   Social History   Tobacco Use  . Smoking status: Former Smoker    Types: Cigars    Quit date: 09/02/1994    Years since quitting: 24.6  . Smokeless tobacco: Never Used  Substance Use Topics  . Alcohol use: Yes    Alcohol/week: 0.0 - 1.0 standard drinks    Comment: occasional- 3 in the past year  . Drug use: No     Family Hx: The patient's family history includes Breast cancer in his maternal grandmother; Diabetes in his maternal grandmother; Heart disease in his father; Kidney disease in his father; Kidney failure in his father; Liver cancer in his paternal grandmother and paternal uncle; Valvular heart disease in his mother. There is no history of Prostate cancer.  ROS:   Please see the history of present illness.     He denies chest pain, palpitations, orthopnea, n, v, dizziness, syncope, or early satiety. He reports DOE, pnd, edema, and recent weight gain though reports now he is down 10 lbs from that of hospital admission.   All other systems reviewed and are negative.   Prior CV studies:   The following studies were reviewed today:  10/2016 [facility] Severely elevated filling pressures, severe pulmonary hypertension and low normal cardiac output. Recommendations: In spite of several days of diuresis, pulmonary capillary wedge pressure is still severely elevated at 31 mmHg. I increased furosemide drip to 10 mg per hour. He is going to require IV diuresis for another few days. Monitor renal function closely. He ultimately will need to follow-up in the heart failure clinic at [name] given complexity of illness. If renal function worsens with  diuresis, we might need to transfer him to an advanced heart failure service.   09/2018 Monitor Chronic atrial fibrillation with an average heart rate 64 bpm. Lowest heart rate was 33 bpm during early morning hours. Rare PVCs.  01/16/2019 Echo  1. Left ventricular ejection fraction, by visual estimation, is 50 to 55%. The left ventricle has normal function. Normal left ventricular size. There is no left ventricular hypertrophy.  2. Definity contrast agent was given IV to delineate the left ventricular endocardial borders.  3. Global right ventricle was not well visualized.The right ventricular size is not well visualized. Right vetricular wall thickness was not assessed.  4. Left atrial size was severely dilated.  5. Right atrial size was severely dilated.  6. Severe calcification of the posterior mitral valve leaflet(s).  7. The mitral valve is degenerative. Trace mitral valve regurgitation.  8. The tricuspid valve is not well visualized. Tricuspid valve regurgitation was not assessed by color flow Doppler.  9. The aortic valve is grossly normal Aortic valve regurgitation was not visualized by color flow Doppler. 10. The pulmonic valve was not well visualized. Pulmonic valve regurgitation is not visualized by color flow Doppler.  [place] echo 03/24/20 SUMMARY The left ventricle is moderately dilated. Mild left ventricular hypertrophy Left ventricular systolic function is low normal.  LV ejection fraction = 50-55%. Unable to fully assess LV regional wall motion Abnormal septal motion noted The right ventricle is normal in size and function. The left atrium is moderately dilated. The right atrium is mildly to moderately dilated. There is mild aortic stenosis. There is mild tricuspid regurgitation. There was insufficient TR detected to calculate RV systolic pressure. Estimated right atrial pressure is 15 mmHg.Marland Kitchen There is no pericardial effusion. Unable to compare with prior  TTE - FINDINGS: LEFT VENTRICLE The left ventricle is moderately dilated. Mild left ventricular hypertrophy. Left ventricular systolic function is low normal. LV ejection fraction = 50-55%. Left ventricular filling pattern is indeterminate. Unable to fully assess LV regional wall motion. Abnormal septal motion noted.   Labs/Other Tests and Data Reviewed:    EKG:  No ECG reviewed. Did review the print out data re: EKGs performed during [place], as well as the echo results as outlined above and previous cardiac studies.   Recent Labs: 01/15/2019: Hemoglobin 10.8; Platelets 137 01/16/2019: ALT 11; B Natriuretic Peptide 125.0; Magnesium 1.9 01/19/2019: BUN 47; Creatinine, Ser 1.46; Potassium 4.0; Sodium 138   Recent Lipid Panel Lab Results  Component Value Date/Time   CHOL 89 10/09/2017 12:00 AM   CHOL 161 03/07/2015 08:23 AM   TRIG 73 03/06/2016 07:36 AM   HDL 34 (L) 03/06/2016 07:36 AM   HDL 38 (L) 03/07/2015 08:23 AM   CHOLHDL 2.7 03/06/2016 07:36 AM   LDLCALC 49 10/09/2017 12:00 AM   LDLCALC 105 (H) 03/07/2015 08:23 AM   LDLDIRECT 41.9 04/20/2012 04:18 PM    Wt Readings from Last 3 Encounters:  04/09/19 268 lb (121.6 kg)  04/07/19 282 lb 8 oz (128.1 kg)  02/03/19 273 lb 4 oz (123.9 kg)     Objective:    Vital Signs:  Ht 5' 10.5" (1.791 m)   Wt 268 lb (121.6 kg)   BMI 37.91 kg/m      Recent Labs from admission: Magnesium 2.0, sodium 144, potassium 4.2, creatinine 1.67, BUN 46, glucose 134, albumin 3.9, calcium 9.4, AST 15, ALT 7, transferrin 342, iron 21, ferritin 17, WBC 3.7, RBC 4.55, hemoglobin 10.1, hematocrit 32.1, platelets 101 09/2017 LDL 49 2018 TSH 2.513  VITAL SIGNS:  reviewed PSYCH:  normal affect  ASSESSMENT & PLAN:    Chronic combined systolic and diastolic congestive heart failure / PAHTN --Reports DOE that improves with rest, LEE, abdominal distention, and PND. No CP. Most recent [facility] and [place] studies as above and outline his  history of HFpEF and pulmonary HTN. Suspect that he is still volume up from his baseline dry weight despite being down 10 lbs from that of his [facility] admission. His weight today is reported as 269lbs, which is reportedly down 10lbs from that of [name] but still up from that of his previously reported dry weight on review of previous clinic notes (~262lbs). In addition, he reports less exercise tolerance than his previous clinic weight and progressive symptoms from that of his previous ROS. Given his pulmonary hypertension, preference is to keep him on the dry side; however, this has been complicated by his renal function and transplant status.  --Given the medication changes (as outlined below under medication management), preference at this time would be to obtain follow-up labs including BMET to monitor his renal function closely s/p a bump in diuresis, as well as get him into clinic to physically assess his volume status and get clinic weights /vitals. Will also obtain a CBC given progressive DOE. He  was last seen by our heart failure clinic ~6 months ago with recommendation to follow-up at this time. Will reach out to the heart failure clinic to get him an appointment as soon as possible. Instructed the patient to present to the ED if his symptoms progressed before that time.  Medication Management --Since last seen in our clinic, his medications have changed as outlined  above and including increasing his lasix from lasix 26m BID to lasix 851mBID (without subsequent increase in potassium supplementation) and increasing amlodipine 91m28maily to 81m12mily. He remains on lisinopril 40mg44mly, taldalafil 40mg 59my, and spironolactone 291mg d70m. He also now has metolazone 2.91mg [id number] for 3# weigh increase. As outlined above, he will need a follow-up BMET after these medication changes with recommendation to reassess renal function and potassium schedule his 6 month follow-up in our heart failure clinic  (due to occur now). Present to the ED with any worsening sx.  Chart Review  --[facility] and labs and compared these findings with that of his previous findings. Reviewed all findings and answered any questions.   HTN --Suspect that BP not well controlled based on cuff readings. He intends to purchase a new arm cuff off of [employer] (current cuff is wrist cuff). Given I am not clear on his BP at this time, no adjustments made in anti-hypertensive regimen given [facility] as outlined above and to prevent any prerenal AKI / avoid hypotension and right to left shunting.   CAD --S/p bypass in 2005. Low risk stress test. No CP but does report dyspnea. Reviewed the septal wall findings on echo as above. Patient is aware to present to the ED with any concerning CP or worsening DOE, as cannot completely rule out cardiac ischemic event. Continue statin therapy. No BB in the setting of previous BB induced bradycardia. No ASA in the setting of Eliquis.   Permanent atrial fibrillation --No symptoms of racing HR or palpitations. No BB in the setting of previous BB induced bradycardia. CHA2DS2VASc score of at least  4. Continue Elqiuis. No s/sx of bleeding.   History of ESRD s/p transplant --Recommend updated labs with BMET and Mg as outlined above and s/p [name] and BUN on all of his previous [place] and [place]ent bradycardia --Previous monitor as outlined above in HPI without significant bradycardic events. No recent presyncope or syncope.  HLD --Continue statin therapy.  Disposition: Follow-up in our HF clinic / in person as soon as appointment becomes available (lost to follow-up). Obtain BMET given medication changes and CBC given progressive DOE. Present to ED with any worsening sx. He will receive a call from our office to coordinate. Further recommendations pending  BMET and CBC results.    COVID-19 Education: The signs and symptoms of COVID-19 were discussed with the patient and how to seek care for testing (follow up with PCP or arrange E-visit).  The importance of social distancing was discussed today.  Time:   Today, I have spent 45 minutes with the patient with telehealth technology discussing the above problems.     Medication Adjustments/Labs and Tests Ordered: Current medicines are reviewed at length with the patient today.  Concerns regarding medicines are outlined above.   Tests Ordered: Orders Placed This Encounter  Procedures  . CBC w/Diff  . Basic Metabolic Panel (BMET)    Medication Changes: No orders of the defined types  were placed in this encounter.   Follow Up:  In Person as soon as possible in our heart failure clinic or within 1 week in our office based on availability of heart failure clinic with further recommendations pending labs results.   Signed, Arvil Chaco, PA-C  04/09/2019 12:43 PM    [facility]

## 2019-04-09 NOTE — Patient Instructions (Signed)
Medication Instructions:   No medication changes.  *If you need a refill on your cardiac medications before your next appointment, please call your pharmacy*  Lab Work:  You need to have a CBC and BMET done. [facility] is open Monday through Friday 7 am to 6 pm.  You do not need an appointment. Your order has been placed in the computer.  If you have labs (blood work) drawn today and your tests are completely normal, you will receive your results only by: Marland Kitchen MyChart Message (if you have MyChart) OR . A paper copy in the mail If you have any lab test that is abnormal or we need to change your treatment, we will call you to review the results.  Testing/Procedures: None yet  Follow-Up: At [facility], you and your health needs are our priority.  As part of our continuing mission to provide you with exceptional heart care, we have created designated Provider Care Teams.  These Care Teams include your primary Cardiologist (physician) and Advanced Practice Providers (APPs -  Physician Assistants and Nurse Practitioners) who all work together to provide you with the care you need, when you need it.  Your next appointment:   2 month(s)  The format for your next appointment:   In Person  Provider:    You may see [name], MD or one of the following Advanced Practice Providers on your designated Care Team:    Murray Hodgkins, NP  Christell Faith, PA-C  Marrianne Mood, PA-C   Other Instructions  You need to call and schedule an appointment with the [facility] clinic.  We have sent them a message that you need to be seen.  The phone number is [phone].

## 2019-04-13 ENCOUNTER — Encounter (HOSPITAL_COMMUNITY): Payer: 59

## 2019-04-22 ENCOUNTER — Encounter (HOSPITAL_COMMUNITY): Payer: 59

## 2019-04-22 ENCOUNTER — Other Ambulatory Visit: Payer: Self-pay | Admitting: Internal Medicine

## 2019-04-22 DIAGNOSIS — J309 Allergic rhinitis, unspecified: Secondary | ICD-10-CM

## 2019-04-26 ENCOUNTER — Other Ambulatory Visit: Payer: Self-pay

## 2019-04-26 ENCOUNTER — Ambulatory Visit (HOSPITAL_COMMUNITY)
Admission: RE | Admit: 2019-04-26 | Discharge: 2019-04-26 | Disposition: A | Discharge status: Home / Self Care | Payer: 59 | Source: Ambulatory Visit | Attending: Cardiology | Admitting: Cardiology

## 2019-04-26 ENCOUNTER — Encounter (HOSPITAL_COMMUNITY): Payer: Self-pay

## 2019-04-26 VITALS — BP 138/87 | HR 66 | Wt 281.8 lb

## 2019-04-26 DIAGNOSIS — Z9884 Bariatric surgery status: Secondary | ICD-10-CM | POA: Insufficient documentation

## 2019-04-26 DIAGNOSIS — E1122 Type 2 diabetes mellitus with diabetic chronic kidney disease: Secondary | ICD-10-CM | POA: Insufficient documentation

## 2019-04-26 DIAGNOSIS — Z9841 Cataract extraction status, right eye: Secondary | ICD-10-CM | POA: Insufficient documentation

## 2019-04-26 DIAGNOSIS — Z951 Presence of aortocoronary bypass graft: Secondary | ICD-10-CM | POA: Diagnosis not present

## 2019-04-26 DIAGNOSIS — Z79899 Other long term (current) drug therapy: Secondary | ICD-10-CM | POA: Diagnosis not present

## 2019-04-26 DIAGNOSIS — Z7951 Long term (current) use of inhaled steroids: Secondary | ICD-10-CM | POA: Insufficient documentation

## 2019-04-26 DIAGNOSIS — D649 Anemia, unspecified: Secondary | ICD-10-CM

## 2019-04-26 DIAGNOSIS — I2510 Atherosclerotic heart disease of native coronary artery without angina pectoris: Secondary | ICD-10-CM | POA: Insufficient documentation

## 2019-04-26 DIAGNOSIS — Z885 Allergy status to narcotic agent status: Secondary | ICD-10-CM | POA: Insufficient documentation

## 2019-04-26 DIAGNOSIS — Z794 Long term (current) use of insulin: Secondary | ICD-10-CM | POA: Insufficient documentation

## 2019-04-26 DIAGNOSIS — Z940 Kidney transplant status: Secondary | ICD-10-CM | POA: Diagnosis not present

## 2019-04-26 DIAGNOSIS — I5032 Chronic diastolic (congestive) heart failure: Secondary | ICD-10-CM | POA: Diagnosis present

## 2019-04-26 DIAGNOSIS — Z87891 Personal history of nicotine dependence: Secondary | ICD-10-CM | POA: Insufficient documentation

## 2019-04-26 DIAGNOSIS — I482 Chronic atrial fibrillation, unspecified: Secondary | ICD-10-CM | POA: Insufficient documentation

## 2019-04-26 DIAGNOSIS — N183 Chronic kidney disease, stage 3 unspecified: Secondary | ICD-10-CM | POA: Insufficient documentation

## 2019-04-26 DIAGNOSIS — I509 Heart failure, unspecified: Secondary | ICD-10-CM

## 2019-04-26 DIAGNOSIS — Z8249 Family history of ischemic heart disease and other diseases of the circulatory system: Secondary | ICD-10-CM | POA: Diagnosis not present

## 2019-04-26 DIAGNOSIS — I5033 Acute on chronic diastolic (congestive) heart failure: Secondary | ICD-10-CM

## 2019-04-26 DIAGNOSIS — E785 Hyperlipidemia, unspecified: Secondary | ICD-10-CM | POA: Diagnosis not present

## 2019-04-26 DIAGNOSIS — I272 Pulmonary hypertension, unspecified: Secondary | ICD-10-CM | POA: Insufficient documentation

## 2019-04-26 DIAGNOSIS — Z9842 Cataract extraction status, left eye: Secondary | ICD-10-CM | POA: Insufficient documentation

## 2019-04-26 DIAGNOSIS — Z841 Family history of disorders of kidney and ureter: Secondary | ICD-10-CM | POA: Insufficient documentation

## 2019-04-26 DIAGNOSIS — Z886 Allergy status to analgesic agent status: Secondary | ICD-10-CM | POA: Insufficient documentation

## 2019-04-26 DIAGNOSIS — I130 Hypertensive heart and chronic kidney disease with heart failure and stage 1 through stage 4 chronic kidney disease, or unspecified chronic kidney disease: Secondary | ICD-10-CM | POA: Diagnosis not present

## 2019-04-26 DIAGNOSIS — Z7901 Long term (current) use of anticoagulants: Secondary | ICD-10-CM | POA: Diagnosis not present

## 2019-04-26 DIAGNOSIS — M109 Gout, unspecified: Secondary | ICD-10-CM | POA: Insufficient documentation

## 2019-04-26 DIAGNOSIS — Z961 Presence of intraocular lens: Secondary | ICD-10-CM | POA: Insufficient documentation

## 2019-04-26 DIAGNOSIS — E669 Obesity, unspecified: Secondary | ICD-10-CM | POA: Insufficient documentation

## 2019-04-26 DIAGNOSIS — J45909 Unspecified asthma, uncomplicated: Secondary | ICD-10-CM | POA: Insufficient documentation

## 2019-04-26 DIAGNOSIS — Z6839 Body mass index (BMI) 39.0-39.9, adult: Secondary | ICD-10-CM | POA: Insufficient documentation

## 2019-04-26 DIAGNOSIS — G4733 Obstructive sleep apnea (adult) (pediatric): Secondary | ICD-10-CM | POA: Insufficient documentation

## 2019-04-26 LAB — BASIC METABOLIC PANEL
Anion gap: 8 (ref 5–15)
BUN: 63 mg/dL — ABNORMAL HIGH (ref 6–20)
CO2: 25 mmol/L (ref 22–32)
Calcium: 9.4 mg/dL (ref 8.9–10.3)
Chloride: 107 mmol/L (ref 98–111)
Creatinine, Ser: 1.79 mg/dL — ABNORMAL HIGH (ref 0.61–1.24)
GFR calc Af Amer: 47 mL/min — ABNORMAL LOW (ref >60)
GFR calc non Af Amer: 41 mL/min — ABNORMAL LOW (ref >60)
Glucose, Bld: 157 mg/dL — ABNORMAL HIGH (ref 70–99)
Potassium: 4.1 mmol/L (ref 3.5–5.1)
Sodium: 140 mmol/L (ref 135–145)

## 2019-04-26 LAB — CBC
HCT: 32.4 % — ABNORMAL LOW (ref 39.0–52.0)
Hemoglobin: 9.4 g/dL — ABNORMAL LOW (ref 13.0–17.0)
MCH: 21.9 pg — ABNORMAL LOW (ref 26.0–34.0)
MCHC: 29 g/dL — ABNORMAL LOW (ref 30.0–36.0)
MCV: 75.5 fL — ABNORMAL LOW (ref 80.0–100.0)
Platelets: 137 10*3/uL — ABNORMAL LOW (ref 150–400)
RBC: 4.29 MIL/uL (ref 4.22–5.81)
RDW: 17.8 % — ABNORMAL HIGH (ref 11.5–15.5)
WBC: 5.5 10*3/uL (ref 4.0–10.5)
nRBC: 0 % (ref 0.0–0.2)

## 2019-04-26 MED ORDER — FUROSEMIDE 10 MG/ML IJ SOLN
80.0000 mg | Freq: Once | INTRAMUSCULAR | Status: AC
  Administered 2019-04-26: 80 mg via INTRAVENOUS
  Filled 2019-04-26: qty 8

## 2019-04-26 MED ORDER — POTASSIUM CHLORIDE ER 10 MEQ PO CPCR: ORAL_CAPSULE | ORAL | 4 refills | Status: DC

## 2019-04-26 MED ORDER — TORSEMIDE 20 MG PO TABS: ORAL_TABLET | ORAL | 3 refills | Status: DC

## 2019-04-26 NOTE — Progress Notes (Addendum)
Heart Failure Note   Date:  04/26/2019   ID:  Levin, Dagostino [birth year], MRN [id number]  Location: Home  Provider location: [address] [place] Type of Visit:  Established patient  PCP:  [name], MD  Cardiologist:  [name] Primary HF: [name]  Reason for Visit : F/u for Chronic Diastolic CHF    History of Present Illness: [name] is a 59 y.o. male presenting to clinic for f/u for chronic diastolic CHF.     Patient has history of CAD s/p CABG 2005, chronic atrial fibrillation on Eliquis, ESRD s/p renal transplant 2012, and chronic diastolic CHF.  His renal function has been fairly stable, most recent creatinine 1.46.  He has been struggling with volume overload/CHF.  He has a history of ischemic cardiomyopathy with EF as low as 35-40% in the past but last echo in 3/20 showed EF 60-65%.  There is evidence for RV failure with pulmonary hypertension by echo.    Admitted 11/05/18 w/ low grade fever and shortness of breath. HS Trop negative. Covid 19 negative. Blood CX negative. Placed on antibiotics and discharged to home the next day.   Today in f/u, he reports increased exertional dyspnea over the last 2 weeks. No resting dyspnea but has experience recent orthopnea/ PND despite wearing BiPAP every night. No associated CP.  Volume elevated, ReDs clip measurement high at 51%. Abdomen more distended than usual. Reports full compliance w/ home meds/ diuretics. Reports no notable change in UOP. His wt has been fairly stable ~273-276 lb on home scale (reports his home wt was 271 lb today). He takes metolazone if wt > 276 lb. Last took metolazone 3 days ago.    Labs (7/19): LDL 49 Labs (2/20): K 3.7, creatinine 1.43 Labs (4/20): K 4.3, creatinine 1.24 Labs (11/06/18): K 3.7 creatinine 1.23  Labs (01/19/19): K 4.0, creatinine 1.46  PMH: 1. CAD: s/p CABG x 4 in [place] in 2005.  - Cardiolite (1/20): EF 47%, small fixed apical septal defect, no ischemia.   Low risk study.  2. Asthma 3. Gout 4. Atrial fibrillation: Chronic.  5. ESRD s/p renal transplantation at [facility] in 2012.  6. Anemia of chronic disease 7. Type II diabetes 8. HTN 9. Hyperlipidemia 10. OSA: On bipap.  11. Ischemic cardiomyopathy: EF 35-40% in past.  - Cardiolite (1/20) with EF 47%.  - Echo (1/20): EF 50-55%, mild MR, RV moderately dilated with moderately decreased systolic function, PASP 73 mmHg, moderate TR.   - Echo (3/20): EF 60-65%, PASP 75 mmHg, mildly dilated RV with mildly decreased systolic function.  12. Obesity: s/p gastric bypass in 2015.  13. Bradycardia with beta blocker.   Past Surgical History:  Procedure Laterality Date   BARIATRIC SURGERY     CARDIAC CATHETERIZATION     [facility]   CATARACT EXTRACTION W/PHACO Right 10/29/2017   Procedure: CATARACT EXTRACTION PHACO AND INTRAOCULAR LENS PLACEMENT ([place])  RIGHT DIABETIC;  Surgeon: [name], MD;  Location: [place];  Service: Ophthalmology;  Laterality: Right;  Diabetic - insulin   CATARACT EXTRACTION W/PHACO Left 11/18/2017   Procedure: CATARACT EXTRACTION PHACO AND INTRAOCULAR LENS PLACEMENT ([place]) LEFT IVA/TOPICAL;  Surgeon: [name], MD;  Location: [place];  Service: Ophthalmology;  Laterality: Left;  DIABETES - insulin sleep apnea   COLONOSCOPY WITH PROPOFOL N/A 04/22/2013   Procedure: COLONOSCOPY WITH PROPOFOL;  Surgeon: [name], MD;  Location: [facility];  Service: Endoscopy;  Laterality:  N/A;   CORONARY ARTERY BYPASS GRAFT  2005   X 4   DG ANGIO AV SHUNT*L*     right and left upper arms   FASCIOTOMY  03/03/2012   Procedure: FASCIOTOMY;  Surgeon: [name], MD;  Location: [place];  Service: Orthopedics;  Laterality: Right;  FASCIOTOMY RIGHT SMALL FINGER   FASCIOTOMY Left 08/17/2013   Procedure: FASCIOTOMY LEFT RING;  Surgeon: [name], MD;  Location: [place];  Service: Orthopedics;  Laterality:  Left;   INCISION AND DRAINAGE ABSCESS Left 10/15/2015   Procedure: INCISION AND DRAINAGE ABSCESS;  Surgeon: [name], MD;  Location: [facility];  Service: General;  Laterality: Left;   KIDNEY TRANSPLANT  09/13/2010   cadaver--[facility] HEART CATH N/A 11/15/2016   Procedure: RIGHT HEART CATH;  Surgeon: [name], MD;  Location: [place] [facility];  Service: Cardiovascular;  Laterality: N/A;   TYMPANIC MEMBRANE REPAIR  1/12   left   VASECTOMY       Current Outpatient Medications  Medication Sig Dispense Refill   albuterol (PROVENTIL HFA;VENTOLIN HFA) 108 (90 Base) MCG/ACT inhaler Inhale 2 puffs every 6 (six) hours as needed into the lungs for wheezing or shortness of breath. 1 Inhaler 2   amLODipine (NORVASC) 5 MG tablet Take 1 tablet (5 mg total) by mouth daily. 90 tablet 3   apixaban (ELIQUIS) 5 MG TABS tablet Take 1 tablet (5 mg total) by mouth 2 (two) times daily. 180 tablet 1   beclomethasone (QVAR REDIHALER) 80 MCG/ACT inhaler Inhale 2 puffs into the lungs 2 (two) times daily. Rinse mouth after use. 1 Inhaler 2   buPROPion (WELLBUTRIN SR) 150 MG 12 hr tablet TAKE 1 TABLET BY MOUTH TWICE DAILY 180 tablet 3   colchicine 0.6 MG tablet Take 1 tablet (0.6 mg total) by mouth 2 (two) times daily as needed (gout flares). 60 tablet 11   ferrous sulfate 325 (65 FE) MG EC tablet Take 325 mg by mouth 2 (two) times daily.     fluticasone (FLONASE) 50 MCG/ACT nasal spray PLACE 2 SPRAYS INTO BOTH NOSTRILS DAILY. 16 g 0   gabapentin (NEURONTIN) 300 MG capsule Take 300 mg by mouth at bedtime.      HUMALOG KWIKPEN 100 UNIT/ML KwikPen Inject 5-10 Units into the skin 3 (three) times daily.      insulin glargine (LANTUS) 100 unit/mL SOPN Inject 0.1 mLs (10 Units total) into the skin at bedtime. Currently using 15 units (Patient taking differently: Inject 30 Units into the skin at bedtime. ) 15 mL 11   lisinopril (ZESTRIL) 40 MG tablet TAKE 1 TABLET BY MOUTH ONCE DAILY  (Patient taking differently: Take 40 mg by mouth daily. ) 90 tablet 3   metolazone (ZAROXOLYN) 2.5 MG tablet Take 2.5 mg by mouth daily as needed.     montelukast (SINGULAIR) 10 MG tablet TAKE 1 TABLET BY MOUTH AT BEDTIME 30 tablet 0   Multiple Vitamin (MULTIVITAMIN WITH MINERALS) TABS tablet Take 1 tablet by mouth daily.     mycophenolate (MYFORTIC) 180 MG EC tablet Take 360 mg by mouth 2 (two) times daily.      omeprazole (PRILOSEC) 20 MG capsule TAKE 1 CAPSULE BY MOUTH ONCE DAILY 90 capsule 3   potassium chloride (MICRO-K) 10 MEQ CR capsule TAKE 2 CAPSULES (20 MEQ TOTAL) BY MOUTH 2 (TWO) TIMES DAILY. 360 capsule 4   rosuvastatin (CRESTOR) 10 MG tablet TAKE 1 TABLET BY MOUTH DAILY  90 tablet 3   spironolactone (ALDACTONE) 25 MG tablet Take 1 tablet (25 mg total) by mouth daily. 30 tablet 11   tacrolimus (PROGRAF) 1 MG capsule Take 1 mg by mouth 2 (two) times daily.     tadalafil, PAH, (ADCIRCA) 20 MG tablet Take 2 tablets (40 mg total) by mouth daily. 60 tablet 6   tamsulosin (FLOMAX) 0.4 MG CAPS capsule Take 0.4 mg by mouth daily.      traMADol (ULTRAM) 50 MG tablet Take 1 tablet (50 mg total) by mouth 3 (three) times daily as needed. 20 tablet 0   [START ON 04/27/2019] torsemide (DEMADEX) 20 MG tablet Take 4 tablets (80 mg total) by mouth every morning AND 2 tablets (40 mg total) every evening. 210 tablet 3   Current Facility-Administered Medications  Medication Dose Route Frequency Provider Last Rate Last Admin   furosemide (LASIX) injection 80 mg  80 mg Intravenous Once Lyda Jester M, PA-C        Allergies:   Morphine, Acetaminophen, and Ibuprofen   Social History:  The patient  reports that he quit smoking about 24 years ago. His smoking use included cigars. He has never used smokeless tobacco. He reports current alcohol use. He reports that he does not use drugs.   Family History:  The patient's family history includes Breast cancer in his maternal grandmother;  Diabetes in his maternal grandmother; Heart disease in his father; Kidney disease in his father; Kidney failure in his father; Liver cancer in his paternal grandmother and paternal uncle; Valvular heart disease in his mother.   ROS:  Please see the history of present illness.   All other systems are personally reviewed and negative.  Vitals:   04/26/19 0953  BP: 138/87  Pulse: 66  SpO2: 94%    Exam:   ReDs Clip 51% General:  Well appearing, obese male. No resp difficulty HEENT: normal Neck: supple. Elevated JVD. Carotids 2+ bilat; no bruits. No lymphadenopathy or thryomegaly appreciated. Cor: PMI nondisplaced. irregularly irregular rhythm, regular rate. No rubs, gallops or murmurs. Lungs: decreased BS at the bases Abdomen: distended but nontender No hepatosplenomegaly. No bruits or masses. Good bowel sounds. Extremities: no cyanosis, clubbing, rash, trace bilateral LEE edema.   Neuro: alert & orientedx3, cranial nerves grossly intact. moves all 4 extremities w/o difficulty. Affect pleasant  Recent Labs: 01/16/2019: ALT 11; B Natriuretic Peptide 125.0; Magnesium 1.9 01/19/2019: BUN 47; Creatinine, Ser 1.46; Potassium 4.0; Sodium 138 04/26/2019: Hemoglobin 9.4; Platelets 137  Personally reviewed   Wt Readings from Last 3 Encounters:  04/26/19 127.8 kg (281 lb 12.8 oz)  04/09/19 121.6 kg (268 lb)  04/07/19 128.1 kg (282 lb 8 oz)    ASSESSMENT AND PLAN:  1. Acute on Chronic diastolic CHF: With prominent RV failure/pulmonary hypertension. ?If RV failure is related to severe OSA, he was a remote smoker.  Echo in 3/20 with EF 60-65%, mildly dilated RV with mildly decreased systolic function, severe pulmonary hypertension.  Repeat Echo 10/20, EF mildly reduced from prior, down to 50-55%. No LVH. RV not well visualized.  - Now w/ progressive HF symptoms, NYHA Class III w/ volume overload, ReDs clip measurement 51% and abdomen more distended compared to usual baseline. Notes recent  orthopnea/PND. No resting dyspnea.   - I've discussed plan w/ [name] today. Will give dose of 80 IV Lasix x 1 in clinic today to help jumpstart diuresis. Check BMP today.  - Change PO diuretic from Lasix to torsemide. Starting 2/2, take Torsemide  80 mg qam and 40 mg qpm and increase Kdur to 40 qam/ 20 qpm.  - Continue Spironolactone 25 mg daily - Plan RHC next week w/ [name] to assess filling pressures. I've discussed risk/benifits w/ pt.  2. Atrial fibrillation: Chronic. Rate controlled.  - not on  blocker due to h/o bradycardia  - continue Eliquis for a/c. Will check CBC today  3. CAD: S/p CABG 2005.  Low risk Cardiolite in 1/20.  Denies CP.  - No ASA given Eliquis use.  - Continue Crestor 10 mg daily - no  blocker due to h/o bradycardia 4. CKD: Stage 3.  S/p renal transplantation in 2012, followed by nephrology. -  He remains on mycophenolate and tacrolimus.   - Check BMP today and again at end of the week w/ planned diuretic adjustment  5. OSA: On Bipap, reports full compliance - Continue Bipap QHS   6. HTN: Stable. 138/87 today  - Continue current regimen - Check BMP today  7. Pulmonary hypertension: Severe pulmonary hypertension by 3/20 echo.  Given his gender, most likely this is a combination of group 2 (pulmonary venous hypertension) and group 3 (OSA, ?unrecognized OHS).   - volume up. IV lasix 80 mg x 1 in clinic today, then change PO diuretic to torsemide 80/40.  - Plan RHC on 2/8 w/ [name]  - continue bipap qhs   Plan RHC next week w/ [name]. F/u in 2 weeks after RHC.     Signed, Lyda Jester, PA-C  04/26/2019  [facility] [address] [facility] [place] [address] [phone] (office) [phone] (fax)

## 2019-04-26 NOTE — H&P (View-Only) (Signed)
Heart Failure Note   Date:  04/26/2019   ID:  [name], Reddy Jun 25, 1960, MRN [id number]  Location: Home  Provider location: [address], [place] Type of Visit:  Established patient  PCP:  [name], MD  Cardiologist:  [name] Primary HF: [name]  Reason for Visit : F/u for Chronic Diastolic CHF    History of Present Illness: [name] is a 59 y.o. male presenting to clinic for f/u for chronic diastolic CHF.     Patient has history of CAD s/p CABG 2005, chronic atrial fibrillation on Eliquis, ESRD s/p renal transplant 2012, and chronic diastolic CHF.  His renal function has been fairly stable, most recent creatinine 1.46.  He has been struggling with volume overload/CHF.  He has a history of ischemic cardiomyopathy with EF as low as 35-40% in the past but last echo in 3/20 showed EF 60-65%.  There is evidence for RV failure with pulmonary hypertension by echo.    Admitted 11/05/18 w/ low grade fever and shortness of breath. HS Trop negative. Covid 19 negative. Blood CX negative. Placed on antibiotics and discharged to home the next day.   Today in f/u, he reports increased exertional dyspnea over the last 2 weeks. No resting dyspnea but has experience recent orthopnea/ PND despite wearing BiPAP every night. No associated CP.  Volume elevated, ReDs clip measurement high at 51%. Abdomen more distended than usual. Reports full compliance w/ home meds/ diuretics. Reports no notable change in UOP. His wt has been fairly stable ~273-276 lb on home scale (reports his home wt was 271 lb today). He takes metolazone if wt > 276 lb. Last took metolazone 3 days ago.    Labs (7/19): LDL 49 Labs (2/20): K 3.7, creatinine 1.43 Labs (4/20): K 4.3, creatinine 1.24 Labs (11/06/18): K 3.7 creatinine 1.23  Labs (01/19/19): K 4.0, creatinine 1.46  PMH: 1. CAD: s/p CABG x 4 in [place] in 2005.  - Cardiolite (1/20): EF 47%, small fixed apical septal defect, no ischemia.   Low risk study.  2. Asthma 3. Gout 4. Atrial fibrillation: Chronic.  5. ESRD s/p renal transplantation at [facility] in 2012.  6. Anemia of chronic disease 7. Type II diabetes 8. HTN 9. Hyperlipidemia 10. OSA: On bipap.  11. Ischemic cardiomyopathy: EF 35-40% in past.  - Cardiolite (1/20) with EF 47%.  - Echo (1/20): EF 50-55%, mild MR, RV moderately dilated with moderately decreased systolic function, PASP 73 mmHg, moderate TR.   - Echo (3/20): EF 60-65%, PASP 75 mmHg, mildly dilated RV with mildly decreased systolic function.  12. Obesity: s/p gastric bypass in 2015.  13. Bradycardia with beta blocker.   Past Surgical History:  Procedure Laterality Date  . BARIATRIC SURGERY    . CARDIAC CATHETERIZATION     [facility]  . CATARACT EXTRACTION W/PHACO Right 10/29/2017   Procedure: CATARACT EXTRACTION PHACO AND INTRAOCULAR LENS PLACEMENT ([place])  RIGHT DIABETIC;  Surgeon: [name], MD;  Location: [place];  Service: Ophthalmology;  Laterality: Right;  Diabetic - insulin  . CATARACT EXTRACTION W/PHACO Left 11/18/2017   Procedure: CATARACT EXTRACTION PHACO AND INTRAOCULAR LENS PLACEMENT ([place]) LEFT IVA/TOPICAL;  Surgeon: [name], MD;  Location: [place];  Service: Ophthalmology;  Laterality: Left;  DIABETES - insulin sleep apnea  . COLONOSCOPY WITH PROPOFOL N/A 04/22/2013   Procedure: COLONOSCOPY WITH PROPOFOL;  Surgeon: [name], MD;  Location: [facility];  Service: Endoscopy;  Laterality:  N/A;  . CORONARY ARTERY BYPASS GRAFT  2005   X 4  . DG ANGIO AV SHUNT*L*     right and left upper arms  . FASCIOTOMY  03/03/2012   Procedure: FASCIOTOMY;  Surgeon: [name], MD;  Location: [place];  Service: Orthopedics;  Laterality: Right;  FASCIOTOMY RIGHT SMALL FINGER  . FASCIOTOMY Left 08/17/2013   Procedure: FASCIOTOMY LEFT RING;  Surgeon: [name], MD;  Location: [place];  Service: Orthopedics;  Laterality:  Left;  . INCISION AND DRAINAGE ABSCESS Left 10/15/2015   Procedure: INCISION AND DRAINAGE ABSCESS;  Surgeon: [name], MD;  Location: [facility];  Service: General;  Laterality: Left;  . KIDNEY TRANSPLANT  09/13/2010   cadaver--at [facility]  . RIGHT HEART CATH N/A 11/15/2016   Procedure: RIGHT HEART CATH;  Surgeon: [name], MD;  Location: [place] [facility];  Service: Cardiovascular;  Laterality: N/A;  . TYMPANIC MEMBRANE REPAIR  1/12   left  . VASECTOMY       Current Outpatient Medications  Medication Sig Dispense Refill  . albuterol (PROVENTIL HFA;VENTOLIN HFA) 108 (90 Base) MCG/ACT inhaler Inhale 2 puffs every 6 (six) hours as needed into the lungs for wheezing or shortness of breath. 1 Inhaler 2  . amLODipine (NORVASC) 5 MG tablet Take 1 tablet (5 mg total) by mouth daily. 90 tablet 3  . apixaban (ELIQUIS) 5 MG TABS tablet Take 1 tablet (5 mg total) by mouth 2 (two) times daily. 180 tablet 1  . beclomethasone (QVAR REDIHALER) 80 MCG/ACT inhaler Inhale 2 puffs into the lungs 2 (two) times daily. Rinse mouth after use. 1 Inhaler 2  . buPROPion (WELLBUTRIN SR) 150 MG 12 hr tablet TAKE 1 TABLET BY MOUTH TWICE DAILY 180 tablet 3  . colchicine 0.6 MG tablet Take 1 tablet (0.6 mg total) by mouth 2 (two) times daily as needed (gout flares). 60 tablet 11  . ferrous sulfate 325 (65 FE) MG EC tablet Take 325 mg by mouth 2 (two) times daily.    . fluticasone (FLONASE) 50 MCG/ACT nasal spray PLACE 2 SPRAYS INTO BOTH NOSTRILS DAILY. 16 g 0  . gabapentin (NEURONTIN) 300 MG capsule Take 300 mg by mouth at bedtime.     Marland Kitchen HUMALOG KWIKPEN 100 UNIT/ML KwikPen Inject 5-10 Units into the skin 3 (three) times daily.     . insulin glargine (LANTUS) 100 unit/mL SOPN Inject 0.1 mLs (10 Units total) into the skin at bedtime. Currently using 15 units (Patient taking differently: Inject 30 Units into the skin at bedtime. ) 15 mL 11  . lisinopril (ZESTRIL) 40 MG tablet TAKE 1 TABLET BY MOUTH ONCE DAILY  (Patient taking differently: Take 40 mg by mouth daily. ) 90 tablet 3  . metolazone (ZAROXOLYN) 2.5 MG tablet Take 2.5 mg by mouth daily as needed.    . montelukast (SINGULAIR) 10 MG tablet TAKE 1 TABLET BY MOUTH AT BEDTIME 30 tablet 0  . Multiple Vitamin (MULTIVITAMIN WITH MINERALS) TABS tablet Take 1 tablet by mouth daily.    . mycophenolate (MYFORTIC) 180 MG EC tablet Take 360 mg by mouth 2 (two) times daily.     Marland Kitchen omeprazole (PRILOSEC) 20 MG capsule TAKE 1 CAPSULE BY MOUTH ONCE DAILY 90 capsule 3  . potassium chloride (MICRO-K) 10 MEQ CR capsule TAKE 2 CAPSULES (20 MEQ TOTAL) BY MOUTH 2 (TWO) TIMES DAILY. 360 capsule 4  . rosuvastatin (CRESTOR) 10 MG tablet TAKE 1 TABLET BY MOUTH DAILY  90 tablet 3  . spironolactone (ALDACTONE) 25 MG tablet Take 1 tablet (25 mg total) by mouth daily. 30 tablet 11  . tacrolimus (PROGRAF) 1 MG capsule Take 1 mg by mouth 2 (two) times daily.    . tadalafil, PAH, (ADCIRCA) 20 MG tablet Take 2 tablets (40 mg total) by mouth daily. 60 tablet 6  . tamsulosin (FLOMAX) 0.4 MG CAPS capsule Take 0.4 mg by mouth daily.     . traMADol (ULTRAM) 50 MG tablet Take 1 tablet (50 mg total) by mouth 3 (three) times daily as needed. 20 tablet 0  . [START ON 04/27/2019] torsemide (DEMADEX) 20 MG tablet Take 4 tablets (80 mg total) by mouth every morning AND 2 tablets (40 mg total) every evening. 210 tablet 3   Current Facility-Administered Medications  Medication Dose Route Frequency Provider Last Rate Last Admin  . furosemide (LASIX) injection 80 mg  80 mg Intravenous Once Lyda Jester M, PA-C        Allergies:   Morphine, Acetaminophen, and Ibuprofen   Social History:  The patient  reports that he quit smoking about 24 years ago. His smoking use included cigars. He has never used smokeless tobacco. He reports current alcohol use. He reports that he does not use drugs.   Family History:  The patient's family history includes Breast cancer in his maternal grandmother;  Diabetes in his maternal grandmother; Heart disease in his father; Kidney disease in his father; Kidney failure in his father; Liver cancer in his paternal grandmother and paternal uncle; Valvular heart disease in his mother.   ROS:  Please see the history of present illness.   All other systems are personally reviewed and negative.  Vitals:   04/26/19 0953  BP: 138/87  Pulse: 66  SpO2: 94%    Exam:   ReDs Clip 51% General:  Well appearing, obese male. No resp difficulty HEENT: normal Neck: supple. Elevated JVD. Carotids 2+ bilat; no bruits. No lymphadenopathy or thryomegaly appreciated. Cor: PMI nondisplaced. irregularly irregular rhythm, regular rate. No rubs, gallops or murmurs. Lungs: decreased BS at the bases Abdomen: distended but nontender No hepatosplenomegaly. No bruits or masses. Good bowel sounds. Extremities: no cyanosis, clubbing, rash, trace bilateral LEE edema.   Neuro: alert & orientedx3, cranial nerves grossly intact. moves all 4 extremities w/o difficulty. Affect pleasant  Recent Labs: 01/16/2019: ALT 11; B Natriuretic Peptide 125.0; Magnesium 1.9 01/19/2019: BUN 47; Creatinine, Ser 1.46; Potassium 4.0; Sodium 138 04/26/2019: Hemoglobin 9.4; Platelets 137  Personally reviewed   Wt Readings from Last 3 Encounters:  04/26/19 127.8 kg (281 lb 12.8 oz)  04/09/19 121.6 kg (268 lb)  04/07/19 128.1 kg (282 lb 8 oz)    ASSESSMENT AND PLAN:  1. Acute on Chronic diastolic CHF: With prominent RV failure/pulmonary hypertension. ?If RV failure is related to severe OSA, he was a remote smoker.  Echo in 3/20 with EF 60-65%, mildly dilated RV with mildly decreased systolic function, severe pulmonary hypertension.  Repeat Echo 10/20, EF mildly reduced from prior, down to 50-55%. No LVH. RV not well visualized.  - Now w/ progressive HF symptoms, NYHA Class III w/ volume overload, ReDs clip measurement 51% and abdomen more distended compared to usual baseline. Notes recent  orthopnea/PND. No resting dyspnea.   - I've discussed plan w/ [name] today. Will give dose of 80 IV Lasix x 1 in clinic today to help jumpstart diuresis. Check BMP today.  - Change PO diuretic from Lasix to torsemide. Starting 2/2, take Torsemide  80 mg qam and 40 mg qpm and increase Kdur to 40 qam/ 20 qpm.  - Continue Spironolactone 25 mg daily - Plan RHC next week w/ [name] to assess filling pressures. I've discussed risk/benifits w/ pt.  2. Atrial fibrillation: Chronic. Rate controlled.  - not on  blocker due to h/o bradycardia  - continue Eliquis for a/c. Will check CBC today  3. CAD: S/p CABG 2005.  Low risk Cardiolite in 1/20.  Denies CP.  - No ASA given Eliquis use.  - Continue Crestor 10 mg daily - no  blocker due to h/o bradycardia 4. CKD: Stage 3.  S/p renal transplantation in 2012, followed by nephrology. -  He remains on mycophenolate and tacrolimus.   - Check BMP today and again at end of the week w/ planned diuretic adjustment  5. OSA: On Bipap, reports full compliance - Continue Bipap QHS   6. HTN: Stable. 138/87 today  - Continue current regimen - Check BMP today  7. Pulmonary hypertension: Severe pulmonary hypertension by 3/20 echo.  Given his gender, most likely this is a combination of group 2 (pulmonary venous hypertension) and group 3 (OSA, ?unrecognized OHS).   - volume up. IV lasix 80 mg x 1 in clinic today, then change PO diuretic to torsemide 80/40.  - Plan RHC on 2/8 w/ [name]  - continue bipap qhs   Plan RHC next week w/ [name]. F/u in 2 weeks after RHC.     Signed, Lyda Jester, PA-C  04/26/2019  [facility] [address] [facility] and [place] [address] [phone] (office) [phone] (fax)

## 2019-04-26 NOTE — Addendum Note (Signed)
Encounter addended by: Marlise Eves, RN on: 04/26/2019 3:57 PM  Actions taken: LDA properties accepted

## 2019-04-26 NOTE — Progress Notes (Signed)
ReDS Vest / Clip - 04/26/19 1000      ReDS Vest / Clip   Station Marker  D    Ruler Value  38    ReDS Value Range  (!) High volume overload    ReDS Actual Value  51    Anatomical Comments  sitting

## 2019-04-26 NOTE — Addendum Note (Signed)
Encounter addended by: Marlise Eves, RN on: 04/26/2019 12:12 PM  Actions taken: MAR administration accepted, Clinical Note Signed

## 2019-04-26 NOTE — Addendum Note (Signed)
Encounter addended by: Marlise Eves, RN on: 04/26/2019 12:11 PM  Actions taken: Visit diagnoses modified, Order list changed, Diagnosis association updated

## 2019-04-26 NOTE — Patient Instructions (Signed)
STOP Lasix START Torsemide 80 mg (4 tabs) in the AM and 40 mg (2 tabs) in the PM  On 04/27/2019  Labs today We will only contact you if something comes back abnormal or we need to make some changes. Otherwise no news is good news!        You are scheduled for a Cardiac Catheterization on Monday, February 8 with Dr. Loralie Champagne.  1. Please arrive at the [facility] (Main Entrance A) at [facility]: [address] [place], [place] [address] at 8:00 AM (This time is two hours before your procedure to ensure your preparation). Free valet parking service is available.   Special note: Every effort is made to have your procedure done on time. Please understand that emergencies sometimes delay scheduled procedures.  2. Diet: Do not eat solid foods after midnight.  The patient may have clear liquids until 5am upon the day of the procedure.  3. Labs: You will need to have blood drawn on Friday, February 5 at  11am in the [facility]. You do not need to be fasting.  You will need a pre procedure COVID test    WHEN: Friday February 5 at  1200 noon WHERE: [facility]  [place] [address]  This is a drive thru testing site, you will remain in your car. Be sure to get in the line FOR PROCEDURES Once you have been swabbed you will need to remain home in quarantine until you return for your procedure.   4. Medication instructions in preparation for your procedure:   Contrast Allergy: No   Stop taking Eliquis (Apixiban) on Friday, February 5.  Hold Torsemide On Monday, February 8  Take only 15 units of insulin the night before your procedure. Do not take any insulin on the day of the procedure.    On the morning of your procedure, take your Aspirin and any morning medicines NOT listed above.  You may use sips of water.  5. Plan for one night stay--bring personal belongings. 6. Bring a current list of your medications and current  insurance cards. 7. You MUST have a responsible person to drive you home. 8. Someone MUST be with you the first 24 hours after you arrive home or your discharge will be delayed. 9. Please wear clothes that are easy to get on and off and wear slip-on shoes.  Thank you for allowing [name] to care for you!   -- [facility]

## 2019-04-26 NOTE — Progress Notes (Signed)
5m IV lasix given in right hand. Tolerated well.

## 2019-04-26 NOTE — Addendum Note (Signed)
Encounter addended by: Marlise Eves, RN on: 04/26/2019 12:49 PM  Actions taken: Pharmacy for encounter modified, Order list changed

## 2019-04-26 NOTE — Addendum Note (Signed)
Encounter addended by: Marlise Eves, RN on: 04/26/2019 4:18 PM  Actions taken: Visit diagnoses modified, Order list changed, Diagnosis association updated

## 2019-04-28 DIAGNOSIS — M25562 Pain in left knee: Secondary | ICD-10-CM | POA: Diagnosis not present

## 2019-04-28 DIAGNOSIS — E662 Morbid (severe) obesity with alveolar hypoventilation: Secondary | ICD-10-CM | POA: Diagnosis not present

## 2019-04-28 DIAGNOSIS — G4733 Obstructive sleep apnea (adult) (pediatric): Secondary | ICD-10-CM | POA: Diagnosis not present

## 2019-04-28 DIAGNOSIS — M25561 Pain in right knee: Secondary | ICD-10-CM | POA: Diagnosis not present

## 2019-04-30 ENCOUNTER — Other Ambulatory Visit: Payer: Self-pay

## 2019-04-30 ENCOUNTER — Ambulatory Visit (HOSPITAL_COMMUNITY)
Admission: RE | Admit: 2019-04-30 | Discharge: 2019-04-30 | Disposition: A | Discharge status: Home / Self Care | Payer: 59 | Source: Ambulatory Visit | Attending: Cardiology | Admitting: Cardiology

## 2019-04-30 ENCOUNTER — Other Ambulatory Visit (HOSPITAL_COMMUNITY)
Admission: RE | Admit: 2019-04-30 | Discharge: 2019-04-30 | Disposition: A | Discharge status: Home / Self Care | Payer: 59 | Source: Ambulatory Visit | Attending: Cardiology | Admitting: Cardiology

## 2019-04-30 DIAGNOSIS — Z20822 Contact with and (suspected) exposure to covid-19: Secondary | ICD-10-CM | POA: Insufficient documentation

## 2019-04-30 DIAGNOSIS — Z01812 Encounter for preprocedural laboratory examination: Secondary | ICD-10-CM | POA: Diagnosis not present

## 2019-04-30 DIAGNOSIS — I5033 Acute on chronic diastolic (congestive) heart failure: Secondary | ICD-10-CM | POA: Insufficient documentation

## 2019-04-30 DIAGNOSIS — D649 Anemia, unspecified: Secondary | ICD-10-CM | POA: Diagnosis not present

## 2019-04-30 LAB — CBC
HCT: 34 % — ABNORMAL LOW (ref 39.0–52.0)
Hemoglobin: 9.8 g/dL — ABNORMAL LOW (ref 13.0–17.0)
MCH: 22.2 pg — ABNORMAL LOW (ref 26.0–34.0)
MCHC: 28.8 g/dL — ABNORMAL LOW (ref 30.0–36.0)
MCV: 76.9 fL — ABNORMAL LOW (ref 80.0–100.0)
Platelets: 149 10*3/uL — ABNORMAL LOW (ref 150–400)
RBC: 4.42 MIL/uL (ref 4.22–5.81)
RDW: 17.9 % — ABNORMAL HIGH (ref 11.5–15.5)
WBC: 5.9 10*3/uL (ref 4.0–10.5)
nRBC: 0 % (ref 0.0–0.2)

## 2019-04-30 LAB — BASIC METABOLIC PANEL
Anion gap: 9 (ref 5–15)
BUN: 65 mg/dL — ABNORMAL HIGH (ref 6–20)
CO2: 24 mmol/L (ref 22–32)
Calcium: 9.5 mg/dL (ref 8.9–10.3)
Chloride: 106 mmol/L (ref 98–111)
Creatinine, Ser: 1.89 mg/dL — ABNORMAL HIGH (ref 0.61–1.24)
GFR calc Af Amer: 44 mL/min — ABNORMAL LOW (ref >60)
GFR calc non Af Amer: 38 mL/min — ABNORMAL LOW (ref >60)
Glucose, Bld: 150 mg/dL — ABNORMAL HIGH (ref 70–99)
Potassium: 4.5 mmol/L (ref 3.5–5.1)
Sodium: 139 mmol/L (ref 135–145)

## 2019-04-30 LAB — SARS CORONAVIRUS 2 (TAT 6-24 HRS): SARS Coronavirus 2: NEGATIVE

## 2019-04-30 LAB — FERRITIN: Ferritin: 37 ng/mL (ref 24–336)

## 2019-04-30 LAB — IRON AND TIBC
Iron: 32 ug/dL — ABNORMAL LOW (ref 45–182)
Saturation Ratios: 7 % — ABNORMAL LOW (ref 17.9–39.5)
TIBC: 430 ug/dL (ref 250–450)
UIBC: 398 ug/dL

## 2019-04-30 LAB — PROTIME-INR
INR: 2 — ABNORMAL HIGH (ref 0.8–1.2)
Prothrombin Time: 22.6 seconds — ABNORMAL HIGH (ref 11.4–15.2)

## 2019-05-01 ENCOUNTER — Other Ambulatory Visit: Payer: Self-pay

## 2019-05-01 ENCOUNTER — Emergency Department
Admission: EM | Admit: 2019-05-01 | Discharge: 2019-05-01 | Disposition: A | Discharge status: Home / Self Care | Payer: 59 | Attending: Emergency Medicine | Admitting: Emergency Medicine

## 2019-05-01 ENCOUNTER — Emergency Department: Payer: 59

## 2019-05-01 DIAGNOSIS — I5032 Chronic diastolic (congestive) heart failure: Secondary | ICD-10-CM | POA: Insufficient documentation

## 2019-05-01 DIAGNOSIS — R0902 Hypoxemia: Secondary | ICD-10-CM | POA: Diagnosis not present

## 2019-05-01 DIAGNOSIS — M11261 Other chondrocalcinosis, right knee: Secondary | ICD-10-CM | POA: Insufficient documentation

## 2019-05-01 DIAGNOSIS — I8000 Phlebitis and thrombophlebitis of superficial vessels of unspecified lower extremity: Secondary | ICD-10-CM | POA: Insufficient documentation

## 2019-05-01 DIAGNOSIS — Z79899 Other long term (current) drug therapy: Secondary | ICD-10-CM | POA: Diagnosis not present

## 2019-05-01 DIAGNOSIS — M79661 Pain in right lower leg: Secondary | ICD-10-CM | POA: Diagnosis not present

## 2019-05-01 DIAGNOSIS — I132 Hypertensive heart and chronic kidney disease with heart failure and with stage 5 chronic kidney disease, or end stage renal disease: Secondary | ICD-10-CM | POA: Insufficient documentation

## 2019-05-01 DIAGNOSIS — Z87891 Personal history of nicotine dependence: Secondary | ICD-10-CM | POA: Diagnosis not present

## 2019-05-01 DIAGNOSIS — Z940 Kidney transplant status: Secondary | ICD-10-CM | POA: Insufficient documentation

## 2019-05-01 DIAGNOSIS — E1122 Type 2 diabetes mellitus with diabetic chronic kidney disease: Secondary | ICD-10-CM | POA: Diagnosis not present

## 2019-05-01 DIAGNOSIS — M25461 Effusion, right knee: Secondary | ICD-10-CM | POA: Insufficient documentation

## 2019-05-01 DIAGNOSIS — M25561 Pain in right knee: Secondary | ICD-10-CM | POA: Diagnosis not present

## 2019-05-01 DIAGNOSIS — N186 End stage renal disease: Secondary | ICD-10-CM | POA: Diagnosis not present

## 2019-05-01 DIAGNOSIS — Z7901 Long term (current) use of anticoagulants: Secondary | ICD-10-CM | POA: Insufficient documentation

## 2019-05-01 DIAGNOSIS — R609 Edema, unspecified: Secondary | ICD-10-CM | POA: Diagnosis not present

## 2019-05-01 DIAGNOSIS — R52 Pain, unspecified: Secondary | ICD-10-CM | POA: Diagnosis not present

## 2019-05-01 DIAGNOSIS — M109 Gout, unspecified: Secondary | ICD-10-CM | POA: Diagnosis not present

## 2019-05-01 LAB — CBC WITH DIFFERENTIAL/PLATELET
Abs Immature Granulocytes: 0.04 10*3/uL (ref 0.00–0.07)
Basophils Absolute: 0 10*3/uL (ref 0.0–0.1)
Basophils Relative: 0 %
Eosinophils Absolute: 0 10*3/uL (ref 0.0–0.5)
Eosinophils Relative: 0 %
HCT: 33.7 % — ABNORMAL LOW (ref 39.0–52.0)
Hemoglobin: 10 g/dL — ABNORMAL LOW (ref 13.0–17.0)
Immature Granulocytes: 1 %
Lymphocytes Relative: 13 %
Lymphs Abs: 1.1 10*3/uL (ref 0.7–4.0)
MCH: 22.2 pg — ABNORMAL LOW (ref 26.0–34.0)
MCHC: 29.7 g/dL — ABNORMAL LOW (ref 30.0–36.0)
MCV: 74.9 fL — ABNORMAL LOW (ref 80.0–100.0)
Monocytes Absolute: 0.9 10*3/uL (ref 0.1–1.0)
Monocytes Relative: 11 %
Neutro Abs: 6 10*3/uL (ref 1.7–7.7)
Neutrophils Relative %: 75 %
Platelets: 145 10*3/uL — ABNORMAL LOW (ref 150–400)
RBC: 4.5 MIL/uL (ref 4.22–5.81)
RDW: 18.1 % — ABNORMAL HIGH (ref 11.5–15.5)
WBC: 8 10*3/uL (ref 4.0–10.5)
nRBC: 0 % (ref 0.0–0.2)

## 2019-05-01 LAB — COMPREHENSIVE METABOLIC PANEL
ALT: 10 U/L (ref 0–44)
AST: 16 U/L (ref 15–41)
Albumin: 4 g/dL (ref 3.5–5.0)
Alkaline Phosphatase: 106 U/L (ref 38–126)
Anion gap: 11 (ref 5–15)
BUN: 58 mg/dL — ABNORMAL HIGH (ref 6–20)
CO2: 23 mmol/L (ref 22–32)
Calcium: 9.6 mg/dL (ref 8.9–10.3)
Chloride: 104 mmol/L (ref 98–111)
Creatinine, Ser: 1.63 mg/dL — ABNORMAL HIGH (ref 0.61–1.24)
GFR calc Af Amer: 53 mL/min — ABNORMAL LOW (ref >60)
GFR calc non Af Amer: 45 mL/min — ABNORMAL LOW (ref >60)
Glucose, Bld: 92 mg/dL (ref 70–99)
Potassium: 4.7 mmol/L (ref 3.5–5.1)
Sodium: 138 mmol/L (ref 135–145)
Total Bilirubin: 1.3 mg/dL — ABNORMAL HIGH (ref 0.3–1.2)
Total Protein: 8 g/dL (ref 6.5–8.1)

## 2019-05-01 LAB — SYNOVIAL CELL COUNT + DIFF, W/ CRYSTALS
Eosinophils-Synovial: 0 %
Lymphocytes-Synovial Fld: 0 %
Monocyte-Macrophage-Synovial Fluid: 3 %
Neutrophil, Synovial: 97 %
Other Cells-SYN: 0
WBC, Synovial: 8561 /mm3 — ABNORMAL HIGH (ref 0–200)

## 2019-05-01 LAB — GLUCOSE, CAPILLARY: Glucose-Capillary: 86 mg/dL (ref 70–99)

## 2019-05-01 MED ORDER — FENTANYL CITRATE (PF) 100 MCG/2ML IJ SOLN
100.0000 ug | Freq: Once | INTRAMUSCULAR | Status: AC
  Administered 2019-05-01: 100 ug via INTRAVENOUS
  Filled 2019-05-01: qty 2

## 2019-05-01 MED ORDER — ORPHENADRINE CITRATE 30 MG/ML IJ SOLN
60.0000 mg | Freq: Two times a day (BID) | INTRAMUSCULAR | Status: DC
  Administered 2019-05-01: 60 mg via INTRAVENOUS
  Filled 2019-05-01: qty 2

## 2019-05-01 MED ORDER — HYDROMORPHONE HCL 1 MG/ML IJ SOLN
1.0000 mg | Freq: Once | INTRAMUSCULAR | Status: AC
  Administered 2019-05-01: 1 mg via INTRAVENOUS
  Filled 2019-05-01: qty 1

## 2019-05-01 MED ORDER — OXYCODONE HCL 5 MG PO TABS
5.0000 mg | ORAL_TABLET | Freq: Once | ORAL | Status: AC
  Administered 2019-05-01: 20:00:00 5 mg via ORAL
  Filled 2019-05-01: qty 1

## 2019-05-01 MED ORDER — DEXAMETHASONE SODIUM PHOSPHATE 10 MG/ML IJ SOLN
10.0000 mg | Freq: Once | INTRAMUSCULAR | Status: AC
  Administered 2019-05-01: 10 mg via INTRAVENOUS
  Filled 2019-05-01: qty 1

## 2019-05-01 MED ORDER — OXYCODONE HCL 5 MG PO TABS: 5.0000 mg | ORAL_TABLET | Freq: Three times a day (TID) | ORAL | 0 refills | Status: DC | PRN

## 2019-05-01 MED ORDER — LIDOCAINE HCL (PF) 1 % IJ SOLN
5.0000 mL | Freq: Once | INTRAMUSCULAR | Status: DC
  Filled 2019-05-01: qty 5

## 2019-05-01 NOTE — Discharge Instructions (Signed)
Please call and schedule an appointment with orthopedics. Return to the ER for symptoms that change or worsen if unable to schedule an appointment with primary care or orthopedics.

## 2019-05-01 NOTE — ED Notes (Signed)
[name] at bedside

## 2019-05-01 NOTE — ED Provider Notes (Signed)
[facility] Emergency Department Provider Note ____________________________________________   First MD Initiated Contact with Patient 05/01/19 1236     (approximate)  I have reviewed the triage vital signs and the nursing notes.   HISTORY  Chief Complaint Knee Pain  HPI [name] is a 59 y.o. male who presents to the emergency department for treatment and evaluation of acute on chronic right knee pain.  He had received a cortisone injection Wednesday for some underlying osteoarthritis.  The 2 days following the pain had improved but today became much worse.  No new injury.  No relief with tramadol.      Past Medical History:  Diagnosis Date  . Allergy    seasonal  . Amnestic MCI (mild cognitive impairment with memory loss)   . Asthma   . Benign neoplasm of colon   . CAD (coronary artery disease) 2005   a.  Status post four-vessel CABG in 2005; b. 03/2018 MV: Small, fixed inferoapical and apical septal defect. No ischemia. EF 47% (60-65% by 05/2018 Echo).  . Chronic atrial fibrillation ([place])    a. CHADS2VASc 4 (CHF, HTN, DM, vascular disease)--> Eliquis; b. 09/2018 Zio: Chronic Afib.  . Chronic combined systolic and diastolic CHF (congestive heart failure) ([place])    a.  7/18 Echo: EF 45-50%; b. 05/2018 Echo: EF 60-65%, RVSP 74.8; c. 12/2018 Echo: EF 50-55%. Sev dil LA.  Marland Kitchen Chronic venous stasis dermatitis of both lower extremities   . Cytomegaloviral disease ([place]) 2017  . Depression   . Diabetes mellitus   . Diverticulosis   . Dyspnea   . Erectile dysfunction   . ESRD (end stage renal disease) ([place]) 2005   a. HD 2004-2012; b. S/p renal transplant in 2012  . GERD (gastroesophageal reflux disease)   . Gout   . Hyperlipidemia    low since renal failure  . Hypertension   . Kidney transplant status, cadaveric 2012  . Obesity   . PAH (pulmonary artery hypertension) ([place])    a. 05/2018 Echo: RVSP 74.90mHg; b. 12/2018 RV not well visualized.  .  Pneumonia   . Purpura ([place]   . Sleep apnea    bipap with oxygen 2 l  . Trigger finger   . Ulcer 05/2016   Left shin  . Wears glasses     Patient Active Problem List   Diagnosis Date Noted  . Pulmonary hypertension due to left heart disease ([place] 01/20/2019  . Right knee pain 01/20/2019  . Chest pain with high risk of acute coronary syndrome 01/15/2019  . Trigger finger, unspecified little finger 12/23/2018  . Superficial thrombophlebitis 12/23/2018  . Fever 11/10/2018  . SOB (shortness of breath) 11/06/2018  . CHF (congestive heart failure) ([place] 11/05/2018  . MDD (major depressive disorder), single episode, mild ([place] 04/29/2018  . Acute gout 03/30/2018  . Cervical sprain, initial encounter 03/02/2018  . Moderate persistent asthma 05/21/2017  . Cough 02/10/2017  . Chronic diastolic heart failure ([place] 11/19/2016  . Chronic venous insufficiency 06/05/2016  . Chronic ulcer of left leg ([facility] 10/26/2015  . Cytomegalovirus (CMV) viremia ([facility] 09/07/2015  . Proteinuria 06/23/2015  . Hypokalemia 10/13/2014  . BPH with obstruction/lower urinary tract symptoms 09/25/2014  . Hypogonadism in male 09/25/2014  . Immunosuppressed status ([place] 09/09/2014  . Type 2 diabetes mellitus with hyperglycemia, with long-term current use of insulin ([place] 09/09/2014  . Chronic atrial fibrillation ([place] 07/18/2014  . DM eye manif type II 10/27/2013  . MDD (recurrent major depressive disorder)  in remission ([place]) 10/04/2013  . Benign neoplasm of colon 04/22/2013  . Diverticulosis of colon (without mention of hemorrhage) 04/22/2013  . History of renal transplant 03/01/2013  . Asthma, persistent controlled 02/25/2013  . Erectile dysfunction 10/20/2012  . Obstructive sleep apnea 09/29/2012  . Obesity hypoventilation syndrome ([place]) 03/31/2012  . Routine general medical examination at a health care facility 11/12/2011  . Type 2 diabetes, controlled, with renal manifestation ([place]) 03/01/2011  .  Essential (primary) hypertension 12/17/2010  . CAD, multiple vessel 12/17/2010  . Hyperlipidemia   . ESRD (end stage renal disease) [facility])     Past Surgical History:  Procedure Laterality Date  . BARIATRIC SURGERY    . CARDIAC CATHETERIZATION     [facility]  . CATARACT EXTRACTION W/PHACO Right 10/29/2017   Procedure: CATARACT EXTRACTION PHACO AND INTRAOCULAR LENS PLACEMENT ([place])  RIGHT DIABETIC;  Surgeon: [name], MD;  Location: [place];  Service: Ophthalmology;  Laterality: Right;  Diabetic - insulin  . CATARACT EXTRACTION W/PHACO Left 11/18/2017   Procedure: CATARACT EXTRACTION PHACO AND INTRAOCULAR LENS PLACEMENT ([place]) LEFT IVA/TOPICAL;  Surgeon: [name], MD;  Location: [place];  Service: Ophthalmology;  Laterality: Left;  DIABETES - insulin sleep apnea  . COLONOSCOPY WITH PROPOFOL N/A 04/22/2013   Procedure: COLONOSCOPY WITH PROPOFOL;  Surgeon: [name], MD;  Location[facility];  Service: Endoscopy;  Laterality: N/A;  . CORONARY ARTERY BYPASS GRAFT  2005   X 4  . DG ANGIO AV SHUNT*L*     right and left upper arms  . FASCIOTOMY  03/03/2012   Procedure: FASCIOTOMY;  Surgeon: [name], MD;  Location: [facility];  Service: Orthopedics;  Laterality: Right;  FASCIOTOMY RIGHT SMALL FINGER  . FASCIOTOMY Left 08/17/2013   Procedure: FASCIOTOMY LEFT RING;  Surgeon: [name], MD;  Location: [facility];  Service: Orthopedics;  Laterality: Left;  . INCISION AND DRAINAGE ABSCESS Left 10/15/2015   Procedure: INCISION AND DRAINAGE ABSCESS;  Surgeon: [name], MD;  Location: [facility];  Service: General;  Laterality: Left;  . KIDNEY TRANSPLANT  09/13/2010   cadaver--at [facility]  . RIGHT HEART CATH N/A 11/15/2016   Procedure: RIGHT HEART CATH;  Surgeon: [name], MD;  Location: [facility];  Service: Cardiovascular;  Laterality: N/A;  . TYMPANIC MEMBRANE REPAIR  1/12   left  . VASECTOMY       Prior to Admission medications   Medication Sig Start Date End Date Taking? Authorizing Provider  albuterol (PROVENTIL HFA;VENTOLIN HFA) 108 (90 Base) MCG/ACT inhaler Inhale 2 puffs every 6 (six) hours as needed into the lungs for wheezing or shortness of breath. 02/10/17   [name], MD  amLODipine (NORVASC) 5 MG tablet Take 1 tablet (5 mg total) by mouth daily. 12/17/18   [name], MD  apixaban (ELIQUIS) 5 MG TABS tablet Take 1 tablet (5 mg total) by mouth 2 (two) times daily. 04/05/19   [name], MD  beclomethasone (QVAR REDIHALER) 80 MCG/ACT inhaler Inhale 2 puffs into the lungs 2 (two) times daily. Rinse mouth after use. Patient taking differently: Inhale 2 puffs into the lungs daily as needed (allergies). Rinse mouth after use. 06/15/18 06/15/19  Laverle Hobby, MD  buPROPion (WELLBUTRIN SR) 150 MG 12 hr tablet TAKE 1 TABLET BY MOUTH TWICE DAILY Patient taking differently: Take 150 mg by mouth 2 (two) times daily.  03/30/19   [name], MD  colchicine 0.6 MG tablet  Take 1 tablet (0.6 mg total) by mouth 2 (two) times daily as needed (gout flares). 04/07/19   [name], MD  ferrous sulfate 325 (65 FE) MG EC tablet Take 650 mg by mouth 2 (two) times daily.     [provider]  fluticasone (FLONASE) 50 MCG/ACT nasal spray PLACE 2 SPRAYS INTO BOTH NOSTRILS DAILY. Patient taking differently: Place into both nostrils daily.  04/22/19   [name], MD  gabapentin (NEURONTIN) 300 MG capsule Take 300 mg by mouth at bedtime.  12/01/18 12/01/19  [provider]  HUMALOG KWIKPEN 100 UNIT/ML KwikPen Inject 5-10 Units into the skin 3 (three) times daily. Sliding scale 09/01/18   [provider]  insulin glargine (LANTUS) 100 unit/mL SOPN Inject 0.1 mLs (10 Units total) into the skin at bedtime. Currently using 15 units Patient taking differently: Inject 28 Units into the skin at bedtime.  11/16/16   Fritzi Mandes, MD  lisinopril  (ZESTRIL) 40 MG tablet TAKE 1 TABLET BY MOUTH ONCE DAILY Patient taking differently: Take 40 mg by mouth daily.  08/19/18   Alisa Graff, FNP  metolazone (ZAROXOLYN) 2.5 MG tablet Take 2.5 mg by mouth daily as needed (fluid retention).     [provider]  montelukast (SINGULAIR) 10 MG tablet TAKE 1 TABLET BY MOUTH AT BEDTIME Patient taking differently: Take 10 mg by mouth at bedtime.  06/01/18   Laverle Hobby, MD  Multiple Vitamin (MULTIVITAMIN WITH MINERALS) TABS tablet Take 1 tablet by mouth daily.    [provider]  mycophenolate (MYFORTIC) 180 MG EC tablet Take 360 mg by mouth 2 (two) times daily.     [provider]  omeprazole (PRILOSEC) 20 MG capsule TAKE 1 CAPSULE BY MOUTH ONCE DAILY Patient taking differently: Take 20 mg by mouth daily.  02/21/16   [name], MD  oxyCODONE (ROXICODONE) 5 MG immediate release tablet Take 1 tablet (5 mg total) by mouth every 8 (eight) hours as needed. 05/01/19 04/30/20  Shihab States, Johnette Abraham B, FNP  potassium chloride (MICRO-K) 10 MEQ CR capsule Take 4 capsules (40 mEq total) by mouth every morning AND 2 capsules (20 mEq total) every evening. 04/26/19   Lyda Jester M, PA-C  rosuvastatin (CRESTOR) 10 MG tablet TAKE 1 TABLET BY MOUTH DAILY Patient taking differently: Take 10 mg by mouth daily.  06/09/18   [name], MD  spironolactone (ALDACTONE) 25 MG tablet Take 1 tablet (25 mg total) by mouth daily. 11/16/16 02/03/28  Fritzi Mandes, MD  tacrolimus (PROGRAF) 1 MG capsule Take 1 mg by mouth 2 (two) times daily.    [provider]  tadalafil, PAH, (ADCIRCA) 20 MG tablet Take 2 tablets (40 mg total) by mouth daily. Patient taking differently: Take 20 mg by mouth daily.  02/03/19   Theora Gianotti, NP  tamsulosin (FLOMAX) 0.4 MG CAPS capsule Take 0.4 mg by mouth daily.     [provider]  torsemide (DEMADEX) 20 MG tablet Take 4 tablets (80 mg total) by mouth every morning AND 2 tablets (40  mg total) every evening. 04/27/19 07/26/19  Lyda Jester M, PA-C  traMADol (ULTRAM) 50 MG tablet Take 1 tablet (50 mg total) by mouth 3 (three) times daily as needed. Patient taking differently: Take 50 mg by mouth 3 (three) times daily as needed for moderate pain.  01/25/19   [name], MD    Allergies Morphine, Acetaminophen, and Ibuprofen  Family History  Problem Relation Age of Onset  .  Heart disease Father   . Kidney failure Father   . Kidney disease Father   . Diabetes Maternal Grandmother   . Breast cancer Maternal Grandmother   . Valvular heart disease Mother   . Liver cancer Paternal Uncle   . Liver cancer Paternal Grandmother   . Prostate cancer Neg Hx     Social History Social History   Tobacco Use  . Smoking status: Former Smoker    Types: Cigars    Quit date: 09/02/1994    Years since quitting: 24.6  . Smokeless tobacco: Never Used  Substance Use Topics  . Alcohol use: Yes    Alcohol/week: 0.0 - 1.0 standard drinks    Comment: occasional- 3 in the past year  . Drug use: No    Review of Systems  Constitutional: No fever/chills Eyes: No visual changes. ENT: No sore throat. Cardiovascular: Denies chest pain. Respiratory: Denies shortness of breath. Gastrointestinal: No abdominal pain.  No nausea, no vomiting.  No diarrhea.  No constipation. Genitourinary: Negative for dysuria. Musculoskeletal: Positive for right knee pain  skin: Negative for rash. Neurological: Negative for headaches, focal weakness or numbness.   ____________________________________________   PHYSICAL EXAM:  VITAL SIGNS: ED Triage Vitals  Enc Vitals Group     BP 05/01/19 1237 140/73     Pulse Rate 05/01/19 1237 83     Resp 05/01/19 1237 20     Temp 05/01/19 1237 98.8 F (37.1 C)     Temp Source 05/01/19 1237 Oral     SpO2 05/01/19 1237 98 %     Weight 05/01/19 1236 265 lb (120.2 kg)     Height 05/01/19 1236 _0  (1.778 m)     Head Circumference --      Peak  Flow --      Pain Score 05/01/19 1234 10     Pain Loc --      Pain Edu? --      Excl. in [place]? --     Constitutional: Alert and oriented. Well appearing and in no acute distress. Eyes: Conjunctivae are normal. PERRL. EOMI. Head: Atraumatic. Nose: No congestion/rhinnorhea. Mouth/Throat: Mucous membranes are moist.  Oropharynx non-erythematous. Neck: No stridor.   Hematological/Lymphatic/Immunilogical: No cervical lymphadenopathy. Cardiovascular: Normal rate, regular rhythm. Grossly normal heart sounds.  Good peripheral circulation. Respiratory: Normal respiratory effort.  No retractions. Lungs CTAB. Gastrointestinal: Soft and nontender. No distention. No abdominal bruits. No CVA tenderness. Genitourinary: Exam deferred Musculoskeletal: Focal tenderness on the medial and popliteal aspect of the right knee with noted suprapatella joint effusion. Neurologic:  Normal speech and language. No gross focal neurologic deficits are appreciated. No gait instability. Skin:  Skin is warm, dry and intact. No rash noted. Psychiatric: Mood and affect are normal. Speech and behavior are normal.  ____________________________________________   LABS (all labs ordered are listed, but only abnormal results are displayed)  Labs Reviewed  CBC WITH DIFFERENTIAL/PLATELET - Abnormal; Notable for the following components:      Result Value   Hemoglobin 10.0 (*)    HCT 33.7 (*)    MCV 74.9 (*)    MCH 22.2 (*)    MCHC 29.7 (*)    RDW 18.1 (*)    Platelets 145 (*)    All other components within normal limits  COMPREHENSIVE METABOLIC PANEL - Abnormal; Notable for the following components:   BUN 58 (*)    Creatinine, Ser 1.63 (*)    Total Bilirubin 1.3 (*)    GFR calc non Af Wyvonnia Lora  45 (*)    GFR calc Af Amer 53 (*)    All other components within normal limits  SYNOVIAL CELL COUNT + DIFF, W/ CRYSTALS - Abnormal; Notable for the following components:   Appearance-Synovial CLOUDY (*)    WBC, Synovial 8,561  (*)    All other components within normal limits  GLUCOSE, CAPILLARY  CBG MONITORING, ED   ____________________________________________  EKG  Not indicated ____________________________________________  RADIOLOGY  ED MD interpretation:    Small suprapatellar joint effusion without fracture or dislocation.  Degenerative joint disease is noted.  I, Sherrie George, personally viewed and evaluated these images (plain radiographs) as part of my medical decision making, as well as reviewing the written report by the radiologist.  Official radiology report(s): US Venous Img Lower Unilateral Right  Result Date: 05/01/2019 CLINICAL DATA:  Pain in the popliteal region. EXAM: RIGHT LOWER EXTREMITY VENOUS DOPPLER ULTRASOUND TECHNIQUE: Gray-scale sonography with compression, as well as color and duplex ultrasound, were performed to evaluate the deep venous system(s) from the level of the common femoral vein through the popliteal and proximal calf veins. COMPARISON:  None. FINDINGS: VENOUS Normal compressibility of the common femoral, superficial femoral, and popliteal veins, as well as the visualized calf veins. Visualized portions of profunda femoral vein and great saphenous vein unremarkable. No filling defects to suggest DVT on grayscale or color Doppler imaging. Doppler waveforms show normal direction of venous flow, normal respiratory phasicity and response to augmentation. Limited views of the contralateral common femoral vein are unremarkable. OTHER None. Limitations: none IMPRESSION: No femoropopliteal DVT nor evidence of DVT within the visualized calf veins. If clinical symptoms are inconsistent or if there are persistent or worsening symptoms, further imaging (possibly involving the iliac veins) may be warranted. Electronically Signed   By: Misty Stanley M.D.   On: 05/01/2019 13:51   DG Knee Complete 4 Views Right  Result Date: 05/01/2019 CLINICAL DATA:  Acute on chronic right knee pain. EXAM:  RIGHT KNEE - COMPLETE 4+ VIEW COMPARISON:  January 20, 2019. FINDINGS: No fracture or dislocation is noted. Vascular calcifications are noted. Mild suprapatellar joint effusion is noted. Chondrocalcinosis seen involving lateral joint space. No significant joint space narrowing is noted. IMPRESSION: Mild suprapatellar joint effusion. No fracture or dislocation is noted. Mild chondrocalcinosis is noted involving lateral joint space suggesting degenerative joint disease. Electronically Signed   By: Marijo Conception M.D.   On: 05/01/2019 14:17    ____________________________________________   PROCEDURES  Procedure(s) performed (including Critical Care):  .Joint Aspiration/Arthrocentesis  Date/Time: 05/01/2019 7:03 PM Performed by: Victorino Dike, FNP Authorized by: Victorino Dike, FNP   Consent:    Consent obtained:  Verbal   Consent given by:  Patient   Risks discussed:  Bleeding, infection and pain Location:    Location:  Knee Anesthesia (see MAR for exact dosages):    Anesthesia method:  Local infiltration   Local anesthetic:  Lidocaine 1% w/o epi Procedure details:    Preparation: Patient was prepped and draped in usual sterile fashion     Needle gauge:  18 G   Ultrasound guidance: no     Approach:  Lateral   Aspirate amount:  50   Aspirate characteristics:  Yellow   Steroid injected: no     Specimen collected: yes   Post-procedure details:    Dressing:  Sterile dressing   Patient tolerance of procedure:  Tolerated well, no immediate complications   ____________________________________________   INITIAL IMPRESSION / ASSESSMENT AND PLAN  59 year old male presenting to the emergency department for treatment and evaluation of acute onset right knee pain without injury.  Plan will be to perform an ultrasound given the patient's comorbidities and likely hypercoagulable state.  X-ray also to be completed.  Patient is in obvious pain and Dilaudid has been  ordered.  DIFFERENTIAL DIAGNOSIS  Steroid flare, degenerative joint disease, septic joint, idiopathic knee pain, gout  ED COURSE  Dilaudid provided significant relief of pain.  Patient was able to tolerate having the ultrasound and x-ray.  Patient continues to complain of significant pain.  Obvious muscle spasms in the right quadricep is noted.  Norflex was ordered.  Patient now resting easily.  No DVT according to ultrasound and no acute findings on the x-ray with the exception of a suprapatellar joint effusion.  Plan will be to place the patient in a knee immobilizer and see if he is able to ambulate or at least bear weight.  If so, the patient will be discharged home to follow-up with his orthopedist.  ----------------------------------------- 4:24 PM on 05/01/2019 -----------------------------------------  Patient unable to bear any weight on the right leg even with assistance of the walker. Will consult with Dr. Sabra Heck to decide on how to proceed.  ----------------------------------------- 7:04 PM on 05/01/2019 -----------------------------------------  Dr. Sabra Heck had recommended knee immobilizer, ice, rest, elevation, and follow-up on Monday.  Just to ensure that there is no evidence of infection, arthrocentesis completed as above.  50 mL of straw-colored synovial fluid was withdrawn.  Specimen sent to lab for cell count plus differential and crystals. Patient expresses relief of pressure and now able to move a little easier.   ----------------------------------------- 7:40 PM on 05/01/2019 -----------------------------------------  Synovial cell count with differential and crystals show calcium pyrophosphate crystals and monosodium urate crystals.  He is unable to use NSAIDs secondary to kidney transplant.  He had been given some Decadron earlier which should help with inflammation.  He is to call orthopedics on Monday to schedule an appointment for recheck and follow-up.  He was  able to stand and take 1 step forward with the assistance of a walker.  He will be discharged home.  Prescription for oxycodone will be prescribed.  ____________________________________________   FINAL CLINICAL IMPRESSION(S) / ED DIAGNOSES  Final diagnoses:  Pseudogout of right knee     ED Discharge Orders         Ordered    oxyCODONE (ROXICODONE) 5 MG immediate release tablet  Every 8 hours PRN     05/01/19 1951           Alrick F Tadros was evaluated in Emergency Department on 05/01/2019 for the symptoms described in the history of present illness. He was evaluated in the context of the global COVID-19 pandemic, which necessitated consideration that the patient might be at risk for infection with the SARS-CoV-2 virus that causes COVID-19. Institutional protocols and algorithms that pertain to the evaluation of patients at risk for COVID-19 are in a state of rapid change based on information released by regulatory bodies including the CDC and federal and state organizations. These policies and algorithms were followed during the patient's care in the ED.   Note:  This document was prepared using Dragon voice recognition software and may include unintentional dictation errors.   Victorino Dike, FNP 05/01/19 1952    Arta Silence, MD 05/02/19 0102    Arta Silence, MD 05/02/19 [phone]

## 2019-05-01 NOTE — ED Notes (Signed)
Xray at bedside

## 2019-05-01 NOTE — ED Notes (Signed)
Pt moving much easier in the bed, still having pain trying to put weight on it trying to walk with a walker.

## 2019-05-01 NOTE — ED Triage Notes (Addendum)
Pt arrives [facility] from home for R knee pain. Received a cortisone shot Wednesday, felt some relief Thursday, but c/o that pain has returned. Pt appears uncomfortable. Hx of cardiac bypass, kidney transplant, DM. Pt arrives [facility], speaking in complete sentences. States last dose pain medication was 2 hours ago, took tramadol. Scheduled for heart cath on Monday. Negative COVID test yesterday.

## 2019-05-03 ENCOUNTER — Encounter (HOSPITAL_COMMUNITY)
Admission: RE | Disposition: A | Discharge status: Home Health | Payer: Self-pay | Source: Home / Self Care | Attending: Cardiology

## 2019-05-03 ENCOUNTER — Telehealth: Payer: Self-pay

## 2019-05-03 ENCOUNTER — Other Ambulatory Visit: Payer: Self-pay

## 2019-05-03 ENCOUNTER — Inpatient Hospital Stay (HOSPITAL_COMMUNITY)
Admission: RE | Admit: 2019-05-03 | Discharge: 2019-05-07 | DRG: 286 | Disposition: A | Discharge status: Home Health | Payer: 59 | Attending: Cardiology | Admitting: Cardiology

## 2019-05-03 ENCOUNTER — Inpatient Hospital Stay (HOSPITAL_COMMUNITY): Payer: 59

## 2019-05-03 DIAGNOSIS — I5021 Acute systolic (congestive) heart failure: Secondary | ICD-10-CM | POA: Diagnosis not present

## 2019-05-03 DIAGNOSIS — I130 Hypertensive heart and chronic kidney disease with heart failure and stage 1 through stage 4 chronic kidney disease, or unspecified chronic kidney disease: Principal | ICD-10-CM | POA: Diagnosis present

## 2019-05-03 DIAGNOSIS — E669 Obesity, unspecified: Secondary | ICD-10-CM | POA: Diagnosis present

## 2019-05-03 DIAGNOSIS — E785 Hyperlipidemia, unspecified: Secondary | ICD-10-CM | POA: Diagnosis present

## 2019-05-03 DIAGNOSIS — M25561 Pain in right knee: Secondary | ICD-10-CM | POA: Diagnosis present

## 2019-05-03 DIAGNOSIS — Z833 Family history of diabetes mellitus: Secondary | ICD-10-CM

## 2019-05-03 DIAGNOSIS — N183 Chronic kidney disease, stage 3 unspecified: Secondary | ICD-10-CM | POA: Diagnosis present

## 2019-05-03 DIAGNOSIS — I5033 Acute on chronic diastolic (congestive) heart failure: Secondary | ICD-10-CM | POA: Diagnosis present

## 2019-05-03 DIAGNOSIS — D631 Anemia in chronic kidney disease: Secondary | ICD-10-CM | POA: Diagnosis present

## 2019-05-03 DIAGNOSIS — E1122 Type 2 diabetes mellitus with diabetic chronic kidney disease: Secondary | ICD-10-CM | POA: Diagnosis present

## 2019-05-03 DIAGNOSIS — G4733 Obstructive sleep apnea (adult) (pediatric): Secondary | ICD-10-CM | POA: Diagnosis present

## 2019-05-03 DIAGNOSIS — Z79899 Other long term (current) drug therapy: Secondary | ICD-10-CM | POA: Diagnosis not present

## 2019-05-03 DIAGNOSIS — I5031 Acute diastolic (congestive) heart failure: Secondary | ICD-10-CM

## 2019-05-03 DIAGNOSIS — Z951 Presence of aortocoronary bypass graft: Secondary | ICD-10-CM

## 2019-05-03 DIAGNOSIS — J45909 Unspecified asthma, uncomplicated: Secondary | ICD-10-CM | POA: Diagnosis present

## 2019-05-03 DIAGNOSIS — Z7901 Long term (current) use of anticoagulants: Secondary | ICD-10-CM

## 2019-05-03 DIAGNOSIS — Z794 Long term (current) use of insulin: Secondary | ICD-10-CM

## 2019-05-03 DIAGNOSIS — Z885 Allergy status to narcotic agent status: Secondary | ICD-10-CM | POA: Diagnosis not present

## 2019-05-03 DIAGNOSIS — Z886 Allergy status to analgesic agent status: Secondary | ICD-10-CM | POA: Diagnosis not present

## 2019-05-03 DIAGNOSIS — R001 Bradycardia, unspecified: Secondary | ICD-10-CM | POA: Diagnosis present

## 2019-05-03 DIAGNOSIS — Z87891 Personal history of nicotine dependence: Secondary | ICD-10-CM

## 2019-05-03 DIAGNOSIS — Z6836 Body mass index (BMI) 36.0-36.9, adult: Secondary | ICD-10-CM | POA: Diagnosis not present

## 2019-05-03 DIAGNOSIS — Z800 Family history of malignant neoplasm of digestive organs: Secondary | ICD-10-CM

## 2019-05-03 DIAGNOSIS — E1165 Type 2 diabetes mellitus with hyperglycemia: Secondary | ICD-10-CM | POA: Diagnosis present

## 2019-05-03 DIAGNOSIS — Z9981 Dependence on supplemental oxygen: Secondary | ICD-10-CM

## 2019-05-03 DIAGNOSIS — Q219 Congenital malformation of cardiac septum, unspecified: Secondary | ICD-10-CM

## 2019-05-03 DIAGNOSIS — Z9884 Bariatric surgery status: Secondary | ICD-10-CM | POA: Diagnosis not present

## 2019-05-03 DIAGNOSIS — Z8249 Family history of ischemic heart disease and other diseases of the circulatory system: Secondary | ICD-10-CM

## 2019-05-03 DIAGNOSIS — Z940 Kidney transplant status: Secondary | ICD-10-CM

## 2019-05-03 DIAGNOSIS — I272 Pulmonary hypertension, unspecified: Secondary | ICD-10-CM | POA: Diagnosis present

## 2019-05-03 DIAGNOSIS — I509 Heart failure, unspecified: Secondary | ICD-10-CM

## 2019-05-03 DIAGNOSIS — M109 Gout, unspecified: Secondary | ICD-10-CM | POA: Diagnosis present

## 2019-05-03 DIAGNOSIS — I4821 Permanent atrial fibrillation: Secondary | ICD-10-CM | POA: Diagnosis present

## 2019-05-03 DIAGNOSIS — Z20822 Contact with and (suspected) exposure to covid-19: Secondary | ICD-10-CM | POA: Diagnosis present

## 2019-05-03 DIAGNOSIS — Z841 Family history of disorders of kidney and ureter: Secondary | ICD-10-CM

## 2019-05-03 DIAGNOSIS — Z803 Family history of malignant neoplasm of breast: Secondary | ICD-10-CM

## 2019-05-03 DIAGNOSIS — Z560 Unemployment, unspecified: Secondary | ICD-10-CM

## 2019-05-03 DIAGNOSIS — Z9989 Dependence on other enabling machines and devices: Secondary | ICD-10-CM

## 2019-05-03 HISTORY — PX: RIGHT HEART CATH: CATH118263

## 2019-05-03 HISTORY — PX: CARDIAC CATHETERIZATION: SHX172

## 2019-05-03 LAB — POCT I-STAT EG7
Acid-base deficit: 1 mmol/L (ref 0.0–2.0)
Acid-base deficit: 1 mmol/L (ref 0.0–2.0)
Bicarbonate: 24.8 mmol/L (ref 20.0–28.0)
Bicarbonate: 24.9 mmol/L (ref 20.0–28.0)
Calcium, Ion: 1.29 mmol/L (ref 1.15–1.40)
Calcium, Ion: 1.35 mmol/L (ref 1.15–1.40)
HCT: 31 % — ABNORMAL LOW (ref 39.0–52.0)
HCT: 32 % — ABNORMAL LOW (ref 39.0–52.0)
Hemoglobin: 10.5 g/dL — ABNORMAL LOW (ref 13.0–17.0)
Hemoglobin: 10.9 g/dL — ABNORMAL LOW (ref 13.0–17.0)
O2 Saturation: 64 %
O2 Saturation: 65 %
Potassium: 4.7 mmol/L (ref 3.5–5.1)
Potassium: 4.9 mmol/L (ref 3.5–5.1)
Sodium: 140 mmol/L (ref 135–145)
Sodium: 141 mmol/L (ref 135–145)
TCO2: 26 mmol/L (ref 22–32)
TCO2: 26 mmol/L (ref 22–32)
pCO2, Ven: 43.7 mmHg — ABNORMAL LOW (ref 44.0–60.0)
pCO2, Ven: 44.3 mmHg (ref 44.0–60.0)
pH, Ven: 7.358 (ref 7.250–7.430)
pH, Ven: 7.362 (ref 7.250–7.430)
pO2, Ven: 35 mmHg (ref 32.0–45.0)
pO2, Ven: 35 mmHg (ref 32.0–45.0)

## 2019-05-03 LAB — GLUCOSE, CAPILLARY
Glucose-Capillary: 249 mg/dL — ABNORMAL HIGH (ref 70–99)
Glucose-Capillary: 253 mg/dL — ABNORMAL HIGH (ref 70–99)
Glucose-Capillary: 340 mg/dL — ABNORMAL HIGH (ref 70–99)
Glucose-Capillary: 335 mg/dL — ABNORMAL HIGH (ref 70–99)
Glucose-Capillary: 305 mg/dL — ABNORMAL HIGH (ref 70–99)

## 2019-05-03 LAB — RESPIRATORY PANEL BY PCR

## 2019-05-03 LAB — ECHOCARDIOGRAM COMPLETE
Height: 70 in
Weight: 4256 oz

## 2019-05-03 LAB — COMPREHENSIVE METABOLIC PANEL
ALT: 10 U/L (ref 0–44)
AST: 15 U/L (ref 15–41)
Albumin: 3.3 g/dL — ABNORMAL LOW (ref 3.5–5.0)
Alkaline Phosphatase: 105 U/L (ref 38–126)
Anion gap: 13 (ref 5–15)
BUN: 87 mg/dL — ABNORMAL HIGH (ref 6–20)
CO2: 22 mmol/L (ref 22–32)
Calcium: 9.6 mg/dL (ref 8.9–10.3)
Chloride: 101 mmol/L (ref 98–111)
Creatinine, Ser: 1.86 mg/dL — ABNORMAL HIGH (ref 0.61–1.24)
GFR calc Af Amer: 45 mL/min — ABNORMAL LOW (ref >60)
GFR calc non Af Amer: 39 mL/min — ABNORMAL LOW (ref >60)
Glucose, Bld: 351 mg/dL — ABNORMAL HIGH (ref 70–99)
Potassium: 4.8 mmol/L (ref 3.5–5.1)
Sodium: 136 mmol/L (ref 135–145)
Total Bilirubin: 0.7 mg/dL (ref 0.3–1.2)
Total Protein: 7.1 g/dL (ref 6.5–8.1)

## 2019-05-03 LAB — CBC WITH DIFFERENTIAL/PLATELET
Abs Immature Granulocytes: 0.02 10*3/uL (ref 0.00–0.07)
Basophils Absolute: 0 10*3/uL (ref 0.0–0.1)
Basophils Relative: 0 %
Eosinophils Absolute: 0 10*3/uL (ref 0.0–0.5)
Eosinophils Relative: 0 %
HCT: 32.7 % — ABNORMAL LOW (ref 39.0–52.0)
Hemoglobin: 9.8 g/dL — ABNORMAL LOW (ref 13.0–17.0)
Immature Granulocytes: 0 %
Lymphocytes Relative: 15 %
Lymphs Abs: 0.7 10*3/uL (ref 0.7–4.0)
MCH: 22.3 pg — ABNORMAL LOW (ref 26.0–34.0)
MCHC: 30 g/dL (ref 30.0–36.0)
MCV: 74.5 fL — ABNORMAL LOW (ref 80.0–100.0)
Monocytes Absolute: 0.4 10*3/uL (ref 0.1–1.0)
Monocytes Relative: 8 %
Neutro Abs: 3.6 10*3/uL (ref 1.7–7.7)
Neutrophils Relative %: 77 %
Platelets: 146 10*3/uL — ABNORMAL LOW (ref 150–400)
RBC: 4.39 MIL/uL (ref 4.22–5.81)
RDW: 18 % — ABNORMAL HIGH (ref 11.5–15.5)
WBC: 4.7 10*3/uL (ref 4.0–10.5)
nRBC: 0 % (ref 0.0–0.2)

## 2019-05-03 LAB — BRAIN NATRIURETIC PEPTIDE: B Natriuretic Peptide: 109.2 pg/mL — ABNORMAL HIGH (ref 0.0–100.0)

## 2019-05-03 LAB — TSH: TSH: 1.615 u[IU]/mL (ref 0.350–4.500)

## 2019-05-03 SURGERY — RIGHT HEART CATH
Anesthesia: LOCAL

## 2019-05-03 MED ORDER — SODIUM CHLORIDE 0.9 % IV SOLN: 250.0000 mL | INTRAVENOUS | Status: DC | PRN

## 2019-05-03 MED ORDER — ADULT MULTIVITAMIN W/MINERALS CH
1.0000 | ORAL_TABLET | Freq: Every day | ORAL | Status: DC
  Administered 2019-05-04 – 2019-05-07 (×4): 1 via ORAL
  Filled 2019-05-03 (×4): qty 1

## 2019-05-03 MED ORDER — ALBUTEROL SULFATE (2.5 MG/3ML) 0.083% IN NEBU: 3.0000 mL | INHALATION_SOLUTION | Freq: Four times a day (QID) | RESPIRATORY_TRACT | Status: DC | PRN

## 2019-05-03 MED ORDER — INSULIN GLARGINE 100 UNIT/ML ~~LOC~~ SOLN
28.0000 [IU] | Freq: Every day | SUBCUTANEOUS | Status: DC
  Administered 2019-05-03 – 2019-05-06 (×4): 28 [IU] via SUBCUTANEOUS
  Filled 2019-05-03 (×6): qty 0.28

## 2019-05-03 MED ORDER — SODIUM CHLORIDE 0.9% FLUSH
3.0000 mL | Freq: Two times a day (BID) | INTRAVENOUS | Status: DC
  Administered 2019-05-03 – 2019-05-07 (×5): 3 mL via INTRAVENOUS

## 2019-05-03 MED ORDER — COLCHICINE 0.6 MG PO TABS: 0.6000 mg | ORAL_TABLET | Freq: Two times a day (BID) | ORAL | Status: DC | PRN

## 2019-05-03 MED ORDER — FERROUS SULFATE 325 (65 FE) MG PO TABS
650.0000 mg | ORAL_TABLET | Freq: Two times a day (BID) | ORAL | Status: DC
  Administered 2019-05-03 – 2019-05-07 (×8): 650 mg via ORAL
  Filled 2019-05-03 (×8): qty 2

## 2019-05-03 MED ORDER — BUPROPION HCL ER (SR) 150 MG PO TB12
150.0000 mg | ORAL_TABLET | Freq: Two times a day (BID) | ORAL | Status: DC
  Administered 2019-05-03 – 2019-05-07 (×8): 150 mg via ORAL
  Filled 2019-05-03 (×8): qty 1

## 2019-05-03 MED ORDER — SPIRONOLACTONE 25 MG PO TABS
25.0000 mg | ORAL_TABLET | Freq: Every day | ORAL | Status: DC
  Administered 2019-05-04 – 2019-05-07 (×4): 25 mg via ORAL
  Filled 2019-05-03 (×4): qty 1

## 2019-05-03 MED ORDER — LIDOCAINE HCL (PF) 1 % IJ SOLN
INTRAMUSCULAR | Status: DC | PRN
  Administered 2019-05-03: 2 mL via SUBCUTANEOUS

## 2019-05-03 MED ORDER — LIDOCAINE HCL (PF) 1 % IJ SOLN
INTRAMUSCULAR | Status: AC
  Filled 2019-05-03: qty 30

## 2019-05-03 MED ORDER — FUROSEMIDE 10 MG/ML IJ SOLN
80.0000 mg | Freq: Two times a day (BID) | INTRAMUSCULAR | Status: DC
  Administered 2019-05-04 – 2019-05-07 (×7): 80 mg via INTRAVENOUS
  Filled 2019-05-03 (×8): qty 8

## 2019-05-03 MED ORDER — ONDANSETRON HCL 4 MG/2ML IJ SOLN: 4.0000 mg | Freq: Four times a day (QID) | INTRAMUSCULAR | Status: DC | PRN

## 2019-05-03 MED ORDER — TRAMADOL HCL 50 MG PO TABS
50.0000 mg | ORAL_TABLET | Freq: Two times a day (BID) | ORAL | Status: DC | PRN
  Administered 2019-05-04 – 2019-05-06 (×5): 50 mg via ORAL
  Filled 2019-05-03 (×5): qty 1

## 2019-05-03 MED ORDER — MONTELUKAST SODIUM 10 MG PO TABS
10.0000 mg | ORAL_TABLET | Freq: Every day | ORAL | Status: DC
  Administered 2019-05-03 – 2019-05-06 (×4): 10 mg via ORAL
  Filled 2019-05-03 (×4): qty 1

## 2019-05-03 MED ORDER — FUROSEMIDE 10 MG/ML IJ SOLN
INTRAMUSCULAR | Status: AC
  Filled 2019-05-03: qty 8

## 2019-05-03 MED ORDER — MIDAZOLAM HCL 2 MG/2ML IJ SOLN
INTRAMUSCULAR | Status: AC
  Filled 2019-05-03: qty 2

## 2019-05-03 MED ORDER — APIXABAN 5 MG PO TABS
5.0000 mg | ORAL_TABLET | Freq: Two times a day (BID) | ORAL | Status: DC
  Administered 2019-05-03 – 2019-05-07 (×8): 5 mg via ORAL
  Filled 2019-05-03 (×9): qty 1

## 2019-05-03 MED ORDER — TAMSULOSIN HCL 0.4 MG PO CAPS
0.4000 mg | ORAL_CAPSULE | Freq: Every day | ORAL | Status: DC
  Administered 2019-05-04 – 2019-05-07 (×4): 0.4 mg via ORAL
  Filled 2019-05-03 (×4): qty 1

## 2019-05-03 MED ORDER — GABAPENTIN 300 MG PO CAPS
300.0000 mg | ORAL_CAPSULE | Freq: Every day | ORAL | Status: DC
  Administered 2019-05-03 – 2019-05-06 (×4): 300 mg via ORAL
  Filled 2019-05-03 (×4): qty 1

## 2019-05-03 MED ORDER — SODIUM CHLORIDE 0.9% FLUSH
3.0000 mL | INTRAVENOUS | Status: DC | PRN
  Administered 2019-05-06: 3 mL via INTRAVENOUS

## 2019-05-03 MED ORDER — LISINOPRIL 20 MG PO TABS
20.0000 mg | ORAL_TABLET | Freq: Every day | ORAL | Status: DC
  Administered 2019-05-04 – 2019-05-07 (×4): 20 mg via ORAL
  Filled 2019-05-03 (×4): qty 1

## 2019-05-03 MED ORDER — SODIUM CHLORIDE 0.9% FLUSH: 3.0000 mL | INTRAVENOUS | Status: DC | PRN

## 2019-05-03 MED ORDER — HEPARIN (PORCINE) IN NACL 1000-0.9 UT/500ML-% IV SOLN
INTRAVENOUS | Status: AC
  Filled 2019-05-03: qty 500

## 2019-05-03 MED ORDER — ACETAMINOPHEN 325 MG PO TABS
650.0000 mg | ORAL_TABLET | ORAL | Status: DC | PRN
  Administered 2019-05-07: 650 mg via ORAL
  Filled 2019-05-03: qty 2

## 2019-05-03 MED ORDER — PANTOPRAZOLE SODIUM 40 MG PO TBEC
40.0000 mg | DELAYED_RELEASE_TABLET | Freq: Every day | ORAL | Status: DC
  Administered 2019-05-04 – 2019-05-07 (×4): 40 mg via ORAL
  Filled 2019-05-03 (×4): qty 1

## 2019-05-03 MED ORDER — HEPARIN (PORCINE) IN NACL 1000-0.9 UT/500ML-% IV SOLN
INTRAVENOUS | Status: DC | PRN
  Administered 2019-05-03: 500 mL

## 2019-05-03 MED ORDER — ROSUVASTATIN CALCIUM 5 MG PO TABS
10.0000 mg | ORAL_TABLET | Freq: Every day | ORAL | Status: DC
  Administered 2019-05-04 – 2019-05-07 (×4): 10 mg via ORAL
  Filled 2019-05-03 (×4): qty 2

## 2019-05-03 MED ORDER — SODIUM CHLORIDE 0.9% FLUSH
3.0000 mL | Freq: Two times a day (BID) | INTRAVENOUS | Status: DC
  Administered 2019-05-04 – 2019-05-05 (×3): 3 mL via INTRAVENOUS

## 2019-05-03 MED ORDER — TACROLIMUS 1 MG PO CAPS
1.0000 mg | ORAL_CAPSULE | Freq: Two times a day (BID) | ORAL | Status: DC
  Administered 2019-05-03 – 2019-05-07 (×8): 1 mg via ORAL
  Filled 2019-05-03 (×9): qty 1

## 2019-05-03 MED ORDER — MIDAZOLAM HCL 2 MG/2ML IJ SOLN
INTRAMUSCULAR | Status: DC | PRN
  Administered 2019-05-03: 0.5 mg via INTRAVENOUS

## 2019-05-03 MED ORDER — INSULIN ASPART 100 UNIT/ML ~~LOC~~ SOLN
0.0000 [IU] | Freq: Three times a day (TID) | SUBCUTANEOUS | Status: DC
  Administered 2019-05-03: 11 [IU] via SUBCUTANEOUS
  Administered 2019-05-04 – 2019-05-05 (×4): 5 [IU] via SUBCUTANEOUS
  Administered 2019-05-05: 2 [IU] via SUBCUTANEOUS
  Administered 2019-05-05 – 2019-05-06 (×2): 3 [IU] via SUBCUTANEOUS
  Administered 2019-05-06 (×2): 5 [IU] via SUBCUTANEOUS
  Administered 2019-05-07: 2 [IU] via SUBCUTANEOUS

## 2019-05-03 MED ORDER — SODIUM CHLORIDE 0.9 % IV SOLN: INTRAVENOUS | Status: DC

## 2019-05-03 MED ORDER — AMLODIPINE BESYLATE 5 MG PO TABS: 5.0000 mg | ORAL_TABLET | Freq: Every day | ORAL | Status: DC

## 2019-05-03 MED ORDER — TADALAFIL 20 MG PO TABS
40.0000 mg | ORAL_TABLET | Freq: Every day | ORAL | Status: DC
  Administered 2019-05-04 – 2019-05-06 (×3): 40 mg via ORAL
  Filled 2019-05-03 (×5): qty 2

## 2019-05-03 MED ORDER — MYCOPHENOLATE SODIUM 180 MG PO TBEC
360.0000 mg | DELAYED_RELEASE_TABLET | Freq: Two times a day (BID) | ORAL | Status: DC
  Administered 2019-05-03 – 2019-05-07 (×8): 360 mg via ORAL
  Filled 2019-05-03 (×9): qty 2

## 2019-05-03 MED ORDER — ASPIRIN 81 MG PO CHEW
81.0000 mg | CHEWABLE_TABLET | ORAL | Status: AC
  Administered 2019-05-03: 81 mg via ORAL
  Filled 2019-05-03: qty 1

## 2019-05-03 MED ORDER — FUROSEMIDE 10 MG/ML IJ SOLN
INTRAMUSCULAR | Status: DC | PRN
  Administered 2019-05-03: 80 mg via INTRAVENOUS

## 2019-05-03 MED ORDER — PERFLUTREN LIPID MICROSPHERE
1.0000 mL | INTRAVENOUS | Status: AC | PRN
  Administered 2019-05-03: 3 mL via INTRAVENOUS
  Filled 2019-05-03: qty 10

## 2019-05-03 MED ORDER — FUROSEMIDE 10 MG/ML IJ SOLN
80.0000 mg | Freq: Once | INTRAMUSCULAR | Status: AC
  Administered 2019-05-03: 80 mg via INTRAVENOUS

## 2019-05-03 SURGICAL SUPPLY — 6 items
CATH BALLN WEDGE 5F 110CM (CATHETERS) ×2 IMPLANT
PACK CARDIAC CATHETERIZATION (CUSTOM PROCEDURE TRAY) ×2 IMPLANT
SHEATH GLIDE SLENDER 4/5FR (SHEATH) ×2 IMPLANT
TRANSDUCER W/STOPCOCK (MISCELLANEOUS) ×2 IMPLANT
TUBING ART PRESS 72  MALE/FEM (TUBING) ×1
TUBING ART PRESS 72 MALE/FEM (TUBING) ×1 IMPLANT

## 2019-05-03 NOTE — Progress Notes (Signed)
  Echocardiogram 2D Echocardiogram has been performed.  Geoffery Lyons Swaim 05/03/2019, 2:16 PM

## 2019-05-03 NOTE — Telephone Encounter (Signed)
[place] Night - Client TELEPHONE ADVICE RECORD AccessNurse Patient Name: [name] Gender: Male DOB: 09/10/1968 Age: 59 Y 20 M 19 D Return Phone Number: [phone] (Primary), [phone] (Secondary) Address: City/State/Zip: [place] [address] Client [facility] Night - Client Client Site [place] Physician [name] - MD Contact Type Call Who Is Calling Patient / Member / Family / Caregiver Call Type Triage / Clinical Relationship To Patient Self Return Phone Number [phone] (Primary) Chief Complaint Walking difficulty Reason for Call Symptomatic / Request for [place] states he is a lot of pain from his knee. Unable to walk correctly. He got a cortisone shot on Wednesday and is unable to move it without feeling excruciating pain. Diagnosed with severe arthritis. Translation No Nurse Assessment Nurse: Harlow Mares, RN, Suanne Marker Date/Time (Eastern Time): 05/01/2019 9:09:43 AM Confirm and document reason for call. If symptomatic, describe symptoms. ---Caller states he is a lot of pain from his knee. Unable to walk correctly. He got a cortisone shot on Wednesday and is unable to move it without feeling excruciating pain. Diagnosed with severe arthritis. Has the patient had close contact with a person known or suspected to have the novel coronavirus illness OR traveled / lives in area with major community spread (including international travel) in the last 14 days from the onset of symptoms? * If Asymptomatic, screen for exposure and travel within the last 14 days. ---No Does the patient have any new or worsening symptoms? ---Yes Will a triage be completed? ---Yes Related visit to physician within the last 2 weeks? ---Yes Does the PT have any chronic conditions? (i.e. diabetes, asthma, this includes High risk factors for pregnancy, etc.) ---Yes List  chronic conditions. ---arthritis; kidney transplant recipient; diabetes; HTN; CAD; gout Is this a behavioral health or substance abuse call? ---No PLEASE NOTE: All timestamps contained within this report are represented as [place] Standard Time. CONFIDENTIALTY NOTICE: This fax transmission is intended only for the addressee. It contains information that is legally privileged, confidential or otherwise protected from use or disclosure. If you are not the intended recipient, you are strictly prohibited from reviewing, disclosing, copying using or disseminating any of this information or taking any action in reliance on or regarding this information. If you have received this fax in error, please notify us immediately by telephone so that we can arrange for its return to [name]. Phone: [phone], Toll-Free: [phone], Fax: [phone] Page: 2 of 2 Call Id: [id number] Guidelines Guideline Title Affirmed Question Affirmed Notes Nurse Date/Time Eilene Ghazi Time) Knee Pain [1] Can't move swollen joint at all AND [2] no fever Harlow Mares, RN, Suanne Marker 05/01/2019 9:11:41 AM Disp. Time Eilene Ghazi Time) Disposition Final User 05/01/2019 9:25:37 AM Send To RN Personal Harlow Mares, RN, Rhonda 05/01/2019 9:14:47 AM See HCP within 4 Hours (or PCP triage) Yes Harlow Mares, RN, Rosalyn Charters Disagree/Comply Comply Caller Understands Yes PreDisposition Call Doctor Care Advice Given Per Guideline SEE HCP WITHIN 4 HOURS (OR PCP TRIAGE): * IF OFFICE WILL BE OPEN: You need to be seen within the next 3 or 4 hours. Call your doctor (or NP/PA) now or as soon as the office opens. CALL BACK IF: * You become worse. CARE ADVICE given per Knee Pain (Adult) guideline. Referrals [facility]

## 2019-05-03 NOTE — H&P (Signed)
Advanced Heart Failure Team History and Physical Note   PCP:  [name], MD  PCP-Cardiology: [name], MD     Reason for Admission: CHF   HPI:    Patient has history of CAD s/p CABG 2005, chronic atrial fibrillation on Eliquis, ESRD s/p renal transplant 2012, and chronic diastolic CHF.  Last creatinine 1.63, stable.  He has been struggling with volume overload/CHF.  He has a history of ischemic cardiomyopathy with EF as low as 35-40% in the past but echo in 3/20 showed EF 60-65%.  There is evidence for RV failure with pulmonary hypertension by echo.    Admitted 11/05/18 w/ low grade fever and shortness of breath. HS Trop negative. Covid 19 negative. Blood CX negative. Placed on antibiotics and discharged to home the next day.   He was started on tadalafil by Ignacia Bayley in fall 2020.   He was admitted with acute/chronic diastolic CHF at [name] in 47/34 and diuresed.   He was seen last week by PA in CHF clinic, abdomen distended and REDS clip measurement markedly high.  He noted significant orthopnea, dyspnea with moderate exertion.  Lasix stopped and torsemide 80 qam/40 qpm started.  He was set up for [place] today.  RHC today showed significantly elevated left and right heart filling pressures, so patient will be admitted for IV diuresis.   RHC Procedural Findings: Hemodynamics (mmHg) RA mean 19 RV 80/20 PA 78/31, 51 PCWP mean 26 Oxygen saturations: PA 65% AO 95% Cardiac Output (Fick) 7.69  Cardiac Index (Fick) 3.26 PVR 3.25 WU  Review of Systems: All systems reviewed and negative except as per HPI.   Home Medications Prior to Admission medications   Medication Sig Start Date End Date Taking? Authorizing Provider  albuterol (PROVENTIL HFA;VENTOLIN HFA) 108 (90 Base) MCG/ACT inhaler Inhale 2 puffs every 6 (six) hours as needed into the lungs for wheezing or shortness of breath. 02/10/17  Yes [name] I, MD  amLODipine (NORVASC) 5 MG tablet Take 1 tablet (5  mg total) by mouth daily. 12/17/18  Yes [name], MD  apixaban (ELIQUIS) 5 MG TABS tablet Take 1 tablet (5 mg total) by mouth 2 (two) times daily. 04/05/19  Yes [name], MD  beclomethasone (QVAR REDIHALER) 80 MCG/ACT inhaler Inhale 2 puffs into the lungs 2 (two) times daily. Rinse mouth after use. Patient taking differently: Inhale 2 puffs into the lungs daily as needed (allergies). Rinse mouth after use. 06/15/18 06/15/19 Yes Laverle Hobby, MD  buPROPion (WELLBUTRIN SR) 150 MG 12 hr tablet TAKE 1 TABLET BY MOUTH TWICE DAILY Patient taking differently: Take 150 mg by mouth 2 (two) times daily.  03/30/19  Yes [name], MD  colchicine 0.6 MG tablet Take 1 tablet (0.6 mg total) by mouth 2 (two) times daily as needed (gout flares). 04/07/19  Yes [name] I, MD  ferrous sulfate 325 (65 FE) MG EC tablet Take 650 mg by mouth 2 (two) times daily.    Yes [provider]  fluticasone (FLONASE) 50 MCG/ACT nasal spray PLACE 2 SPRAYS INTO BOTH NOSTRILS DAILY. Patient taking differently: Place into both nostrils daily.  04/22/19  Yes [name], MD  gabapentin (NEURONTIN) 300 MG capsule Take 300 mg by mouth at bedtime.  12/01/18 12/01/19 Yes [provider]  HUMALOG KWIKPEN 100 UNIT/ML KwikPen Inject 5-10 Units into the skin 3 (three) times daily. Sliding scale 09/01/18  Yes [provider]  insulin glargine (LANTUS) 100 unit/mL SOPN Inject  0.1 mLs (10 Units total) into the skin at bedtime. Currently using 15 units Patient taking differently: Inject 28 Units into the skin at bedtime.  11/16/16  Yes Fritzi Mandes, MD  lisinopril (ZESTRIL) 40 MG tablet TAKE 1 TABLET BY MOUTH ONCE DAILY Patient taking differently: Take 40 mg by mouth daily.  08/19/18  Yes [name], Otila Kluver A, FNP  metolazone (ZAROXOLYN) 2.5 MG tablet Take 2.5 mg by mouth daily as needed (fluid retention).    Yes [provider]  montelukast (SINGULAIR) 10 MG tablet TAKE 1 TABLET BY  MOUTH AT BEDTIME Patient taking differently: Take 10 mg by mouth at bedtime.  06/01/18  Yes Laverle Hobby, MD  Multiple Vitamin (MULTIVITAMIN WITH MINERALS) TABS tablet Take 1 tablet by mouth daily.   Yes [provider]  mycophenolate (MYFORTIC) 180 MG EC tablet Take 360 mg by mouth 2 (two) times daily.    Yes [provider]  omeprazole (PRILOSEC) 20 MG capsule TAKE 1 CAPSULE BY MOUTH ONCE DAILY Patient taking differently: Take 20 mg by mouth daily.  02/21/16  Yes [name], MD  oxyCODONE (ROXICODONE) 5 MG immediate release tablet Take 1 tablet (5 mg total) by mouth every 8 (eight) hours as needed. 05/01/19 04/30/20 Yes Triplett, Cari B, FNP  potassium chloride (MICRO-K) 10 MEQ CR capsule Take 4 capsules (40 mEq total) by mouth every morning AND 2 capsules (20 mEq total) every evening. 04/26/19  Yes Simmons, Brittainy M, PA-C  rosuvastatin (CRESTOR) 10 MG tablet TAKE 1 TABLET BY MOUTH DAILY Patient taking differently: Take 10 mg by mouth daily.  06/09/18  Yes [name], MD  spironolactone (ALDACTONE) 25 MG tablet Take 1 tablet (25 mg total) by mouth daily. 11/16/16 02/03/28 Yes Fritzi Mandes, MD  tacrolimus (PROGRAF) 1 MG capsule Take 1 mg by mouth 2 (two) times daily.   Yes [provider]  tadalafil, PAH, (ADCIRCA) 20 MG tablet Take 2 tablets (40 mg total) by mouth daily. Patient taking differently: Take 20 mg by mouth daily.  02/03/19  Yes Theora Gianotti, NP  tamsulosin (FLOMAX) 0.4 MG CAPS capsule Take 0.4 mg by mouth daily.    Yes [provider]  torsemide (DEMADEX) 20 MG tablet Take 4 tablets (80 mg total) by mouth every morning AND 2 tablets (40 mg total) every evening. 04/27/19 07/26/19 Yes Simmons, Brittainy M, PA-C  traMADol (ULTRAM) 50 MG tablet Take 1 tablet (50 mg total) by mouth 3 (three) times daily as needed. Patient taking differently: Take 50 mg by mouth 3 (three) times daily as needed for moderate pain.  01/25/19  Yes  [name], MD    Past Medical History: 1. CAD: s/p CABG x 4 in [place] in 2005.  - Cardiolite (1/20): EF 47%, small fixed apical septal defect, no ischemia.  Low risk study.  2. Asthma 3. Gout 4. Atrial fibrillation: Chronic.  5. ESRD s/p renal transplantation at [facility] in 2012.  6. Anemia of chronic disease 7. Type II diabetes 8. HTN 9. Hyperlipidemia 10. OSA: On bipap.  11. Ischemic cardiomyopathy: EF 35-40% in past.  - Cardiolite (1/20) with EF 47%.  - Echo (1/20): EF 50-55%, mild MR, RV moderately dilated with moderately decreased systolic function, PASP 73 mmHg, moderate TR.   - Echo (3/20): EF 60-65%, PASP 75 mmHg, mildly dilated RV with mildly decreased systolic function.  - Echo (10/20): EF 50-55%, RV poorly visualized, severe biatrial enlargement, unable to estimate PA pressure.  12. Obesity: s/p gastric bypass  in 2015.  13. Bradycardia with beta blocker.  14. Pulmonary hypertension: Primarily pulmonary venous hypertension.   Past Surgical History: Past Surgical History:  Procedure Laterality Date  . BARIATRIC SURGERY    . CARDIAC CATHETERIZATION     [facility]  . CATARACT EXTRACTION W/PHACO Right 10/29/2017   Procedure: CATARACT EXTRACTION PHACO AND INTRAOCULAR LENS PLACEMENT ([place])  RIGHT DIABETIC;  Surgeon: [name], MD;  Location: [place];  Service: Ophthalmology;  Laterality: Right;  Diabetic - insulin  . CATARACT EXTRACTION W/PHACO Left 11/18/2017   Procedure: CATARACT EXTRACTION PHACO AND INTRAOCULAR LENS PLACEMENT ([place]) LEFT IVA/TOPICAL;  Surgeon: [name], MD;  Location: [place];  Service: Ophthalmology;  Laterality: Left;  DIABETES - insulin sleep apnea  . COLONOSCOPY WITH PROPOFOL N/A 04/22/2013   Procedure: COLONOSCOPY WITH PROPOFOL;  Surgeon: [name], MD;  Location: [facility];  Service: Endoscopy;  Laterality: N/A;  . CORONARY ARTERY BYPASS GRAFT  2005   X 4  . DG ANGIO AV SHUNT*L*     right and  left upper arms  . FASCIOTOMY  03/03/2012   Procedure: FASCIOTOMY;  Surgeon: [name], MD;  Location: [place];  Service: Orthopedics;  Laterality: Right;  FASCIOTOMY RIGHT SMALL FINGER  . FASCIOTOMY Left 08/17/2013   Procedure: FASCIOTOMY LEFT RING;  Surgeon: [name], MD;  Location: [place];  Service: Orthopedics;  Laterality: Left;  . INCISION AND DRAINAGE ABSCESS Left 10/15/2015   Procedure: INCISION AND DRAINAGE ABSCESS;  Surgeon: [name], MD;  Location: [facility];  Service: General;  Laterality: Left;  . KIDNEY TRANSPLANT  09/13/2010   cadaver--at [facility]  . RIGHT HEART CATH N/A 11/15/2016   Procedure: RIGHT HEART CATH;  Surgeon: [name], MD;  Location: [place] [facility];  Service: Cardiovascular;  Laterality: N/A;  . TYMPANIC MEMBRANE REPAIR  1/12   left  . VASECTOMY      Family History: Family History  Problem Relation Age of Onset  . Heart disease Father   . Kidney failure Father   . Kidney disease Father   . Diabetes Maternal Grandmother   . Breast cancer Maternal Grandmother   . Valvular heart disease Mother   . Liver cancer Paternal Uncle   . Liver cancer Paternal Grandmother   . Prostate cancer Neg Hx     Social History: Social History   Socioeconomic History  . Marital status: Married    Spouse name: Not on file  . Number of children: 2  . Years of education: Not on file  . Highest education level: Not on file  Occupational History  . Occupation: Academic librarian: unemployed    Comment: disabled due to kidney failure  Tobacco Use  . Smoking status: Former Smoker    Types: Cigars    Quit date: 09/02/1994    Years since quitting: 24.6  . Smokeless tobacco: Never Used  Substance and Sexual Activity  . Alcohol use: Yes    Alcohol/week: 0.0 - 1.0 standard drinks    Comment: occasional- 3 in the past year  . Drug use: No  . Sexual activity: Not on file  Other Topics Concern  . Not  on file  Social History Narrative   In school for culinary arts      Has living will   Wife is health care POA   Would accept resuscitation attempts   Hasn't considered tube feedings   Social Determinants of Health  Financial Resource Strain:   . Difficulty of Paying Living Expenses: Not on file  Food Insecurity:   . Worried About Charity fundraiser in the Last Year: Not on file  . Ran Out of Food in the Last Year: Not on file  Transportation Needs:   . Lack of Transportation (Medical): Not on file  . Lack of Transportation (Non-Medical): Not on file  Physical Activity:   . Days of Exercise per Week: Not on file  . Minutes of Exercise per Session: Not on file  Stress:   . Feeling of Stress : Not on file  Social Connections:   . Frequency of Communication with Friends and Family: Not on file  . Frequency of Social Gatherings with Friends and Family: Not on file  . Attends Religious Services: Not on file  . Active Member of Clubs or Organizations: Not on file  . Attends Archivist Meetings: Not on file  . Marital Status: Not on file    Allergies:  Allergies  Allergen Reactions  . Morphine Itching  . Acetaminophen Other (See Comments)    BC of transplant  . Ibuprofen Other (See Comments)    Due to kidney transplant    Objective:    Vital Signs:   Temp:  [98.2 F (36.8 C)] 98.2 F (36.8 C) (02/08 0753) Pulse Rate:  [67-91] 75 (02/08 0949) Resp:  [8-26] 24 (02/08 0949) BP: (109-182)/(56-92) 116/61 (02/08 0949) SpO2:  [95 %-99 %] 96 % (02/08 0949) Weight:  [120.7 kg] 120.7 kg (02/08 0753)   Filed Weights   05/03/19 0753  Weight: 120.7 kg     Physical Exam     General:  Well appearing. No respiratory difficulty HEENT: Normal Neck: Thick, JVP 16 cm. Carotids 2+ bilat; no bruits. No lymphadenopathy or thyromegaly appreciated. Cor: PMI nondisplaced. Irregular rate & rhythm. No rubs, gallops or murmurs. Lungs: Decreased at bases.  Abdomen: Soft,  nontender, nondistended. No hepatosplenomegaly. No bruits or masses. Good bowel sounds. Extremities: No cyanosis, clubbing, rash. 1+ ankle edema.  Neuro: Alert & oriented x 3, cranial nerves grossly intact. moves all 4 extremities w/o difficulty. Affect pleasant.   Telemetry   Atrial fibrillation in 70s (personally reviewed)  EKG   Atrial fibrillation, IVCD 140 msec personally reviewed  Labs     Basic Metabolic Panel: Recent Labs  Lab 04/26/19 1029 04/30/19 1105 05/01/19 1252  NA 140 139 138  K 4.1 4.5 4.7  CL 107 106 104  CO2 _0 GLUCOSE 157* 150* 92  BUN 63* 65* 58*  CREATININE 1.79* 1.89* 1.63*  CALCIUM 9.4 9.5 9.6    Liver Function Tests: Recent Labs  Lab 05/01/19 1252  AST 16  ALT 10  ALKPHOS 106  BILITOT 1.3*  PROT 8.0  ALBUMIN 4.0   No results for input(s): LIPASE, AMYLASE in the last 168 hours. No results for input(s): AMMONIA in the last 168 hours.  CBC: Recent Labs  Lab 04/26/19 1031 04/30/19 1105 05/01/19 1252  WBC 5.5 5.9 8.0  NEUTROABS  --   --  6.0  HGB 9.4* 9.8* 10.0*  HCT 32.4* 34.0* 33.7*  MCV 75.5* 76.9* 74.9*  PLT 137* 149* 145*    Cardiac Enzymes: No results for input(s): CKTOTAL, CKMB, CKMBINDEX, TROPONINI in the last 168 hours.  BNP: BNP (last 3 results) Recent Labs    01/16/19 0534  BNP 125.0*    ProBNP (last 3 results) No results for input(s): PROBNP in the last 8760 hours.  CBG: Recent Labs  Lab 05/01/19 1242 05/03/19 0753  GLUCAP 86 249*    Coagulation Studies: Recent Labs    04/30/19 1105  LABPROT 22.6*  INR 2.0*    Imaging: CARDIAC CATHETERIZATION  Result Date: 05/03/2019 1. Severely elevated right and left heart filling pressures. 2. Severe pulmonary hypertension, likely primarily pulmonary venous hypertension. 3. Preserved cardiac output.   US Venous Img Lower Unilateral Right  Result Date: 05/01/2019 CLINICAL DATA:  Pain in the popliteal region. EXAM: RIGHT LOWER EXTREMITY VENOUS  DOPPLER ULTRASOUND TECHNIQUE: Gray-scale sonography with compression, as well as color and duplex ultrasound, were performed to evaluate the deep venous system(s) from the level of the common femoral vein through the popliteal and proximal calf veins. COMPARISON:  None. FINDINGS: VENOUS Normal compressibility of the common femoral, superficial femoral, and popliteal veins, as well as the visualized calf veins. Visualized portions of profunda femoral vein and great saphenous vein unremarkable. No filling defects to suggest DVT on grayscale or color Doppler imaging. Doppler waveforms show normal direction of venous flow, normal respiratory phasicity and response to augmentation. Limited views of the contralateral common femoral vein are unremarkable. OTHER None. Limitations: none IMPRESSION: No femoropopliteal DVT nor evidence of DVT within the visualized calf veins. If clinical symptoms are inconsistent or if there are persistent or worsening symptoms, further imaging (possibly involving the iliac veins) may be warranted. Electronically Signed   By: Misty Stanley M.D.   On: 05/01/2019 13:51   DG Knee Complete 4 Views Right  Result Date: 05/01/2019 CLINICAL DATA:  Acute on chronic right knee pain. EXAM: RIGHT KNEE - COMPLETE 4+ VIEW COMPARISON:  January 20, 2019. FINDINGS: No fracture or dislocation is noted. Vascular calcifications are noted. Mild suprapatellar joint effusion is noted. Chondrocalcinosis seen involving lateral joint space. No significant joint space narrowing is noted. IMPRESSION: Mild suprapatellar joint effusion. No fracture or dislocation is noted. Mild chondrocalcinosis is noted involving lateral joint space suggesting degenerative joint disease. Electronically Signed   By: Marijo Conception M.D.   On: 05/01/2019 14:17       Assessment/Plan   1. Acute on chronic diastolic CHF: With prominent RV failure/pulmonary hypertension. ?If RV failure is related to severe OSA, he was a remote  smoker.  Echo in 3/20 with EF 60-65%, mildly dilated RV with mildly decreased systolic function, severe pulmonary hypertension.  Repeat Echo 10/20, EF mildly reduced from prior, down to 50-55%, RV not well visualized.  Recently, he has had progressive CHF symptoms with worsening orthopnea.  RHC today with significantly elevated right and left heart filling pressures and primarily pulmonary venous hypertension, CO preserved.  - I will admit him for IV diuresis, start Lasix 80 mg IV bid and follow response.   - Continue Spironolactone 25 mg daily - Consider Cardiomems placement.  - CXR 2. Atrial fibrillation: Chronic. Rate controlled.  - not on ? blocker due to h/o bradycardia  - continue Eliquis, restart post-cath.   3. CAD: S/p CABG 2005.  Low risk Cardiolite in 1/20.  Denies CP.  - No ASA given Eliquis use.  - Continue Crestor 10 mg daily - no ? blocker due to h/o bradycardia 4. CKD: Stage 3.  S/p renal transplantation in 2012, followed by nephrology. - He remains on mycophenolate and tacrolimus, will continue.   - Follow BMET closely with diuresis.  5. OSA: On Bipap, reports full compliance - Continue Bipap QHS   6. HTN: BP stable.   - Will continue amlodipine 5 mg daily. -  Decrease lisinopril to 20 mg daily while aggressively diuresing until we see how creatinine trends.  7. Pulmonary hypertension: Severe pulmonary hypertension by 3/20 echo.  Given his gender, most likely this is a combination of group 2 (pulmonary venous hypertension) and group 3 (OSA, ?unrecognized OHS).  On RHC today, primarily pulmonary venous hypertension.  Of note, he was started on tadafil at the [facility] cardiology clinic.  - Can continue tadalafil for now but would hold off on additional [place] medications as I do not think this is likely group 1 PH.   Loralie Champagne, MD 05/03/2019, 10:05 AM  Advanced Heart Failure Team Pager [phone] (M-F; 7a - 4p)  Please contact [facility] for night-coverage after hours (4p -7a )  and weekends on [email or web address]

## 2019-05-03 NOTE — Interval H&P Note (Signed)
History and Physical Interval Note:  05/03/2019 9:25 AM  [name]  has presented today for surgery, with the diagnosis of heart failure.  The various methods of treatment have been discussed with the patient and family. After consideration of risks, benefits and other options for treatment, the patient has consented to  Procedure(s): RIGHT HEART CATH (N/A) as a surgical intervention.  The patient's history has been reviewed, patient examined, no change in status, stable for surgery.  I have reviewed the patient's chart and labs.  Questions were answered to the patient's satisfaction.     Dallin Mccorkel [employer]

## 2019-05-03 NOTE — Plan of Care (Signed)
  Problem: Education: Goal: Knowledge of General Education information will improve Description: Including pain rating scale, medication(s)/side effects and non-pharmacologic comfort measures Outcome: Progressing   Problem: Clinical Measurements: Goal: Will remain free from infection Outcome: Progressing   Problem: Activity: Goal: Risk for activity intolerance will decrease Outcome: Progressing   Problem: Coping: Goal: Level of anxiety will decrease Outcome: Progressing   Problem: Pain Managment: Goal: General experience of comfort will improve Outcome: Progressing   Elesa Hacker, RN

## 2019-05-03 NOTE — Telephone Encounter (Signed)
He is currently in the hospital with exacerbation of CHF. I will try to reach him--and recommend he try to get an orthopedist to see him while he is there (if still needed)

## 2019-05-03 NOTE — Telephone Encounter (Signed)
Pt was seen in the ER for knee pain on 05/01/19.

## 2019-05-04 ENCOUNTER — Inpatient Hospital Stay (HOSPITAL_COMMUNITY): Payer: 59

## 2019-05-04 DIAGNOSIS — I5021 Acute systolic (congestive) heart failure: Secondary | ICD-10-CM

## 2019-05-04 LAB — BASIC METABOLIC PANEL WITH GFR
Anion gap: 10 (ref 5–15)
BUN: 85 mg/dL — ABNORMAL HIGH (ref 6–20)
CO2: 26 mmol/L (ref 22–32)
Calcium: 9.8 mg/dL (ref 8.9–10.3)
Chloride: 103 mmol/L (ref 98–111)
Creatinine, Ser: 1.71 mg/dL — ABNORMAL HIGH (ref 0.61–1.24)
GFR calc Af Amer: 50 mL/min — ABNORMAL LOW (ref >60)
GFR calc non Af Amer: 43 mL/min — ABNORMAL LOW (ref >60)
Glucose, Bld: 286 mg/dL — ABNORMAL HIGH (ref 70–99)
Potassium: 4.4 mmol/L (ref 3.5–5.1)
Sodium: 139 mmol/L (ref 135–145)

## 2019-05-04 LAB — GLUCOSE, CAPILLARY
Glucose-Capillary: 204 mg/dL — ABNORMAL HIGH (ref 70–99)
Glucose-Capillary: 227 mg/dL — ABNORMAL HIGH (ref 70–99)
Glucose-Capillary: 239 mg/dL — ABNORMAL HIGH (ref 70–99)
Glucose-Capillary: 202 mg/dL — ABNORMAL HIGH (ref 70–99)

## 2019-05-04 LAB — HEMOGLOBIN A1C
Hgb A1c MFr Bld: 8.2 % — ABNORMAL HIGH (ref 4.8–5.6)
Mean Plasma Glucose: 189 mg/dL

## 2019-05-04 MED ORDER — COLCHICINE 0.6 MG PO TABS
0.6000 mg | ORAL_TABLET | Freq: Every day | ORAL | Status: DC
  Administered 2019-05-04 – 2019-05-07 (×4): 0.6 mg via ORAL
  Filled 2019-05-04 (×4): qty 1

## 2019-05-04 MED ORDER — SODIUM CHLORIDE 0.9 % IV SOLN
510.0000 mg | Freq: Once | INTRAVENOUS | Status: AC
  Administered 2019-05-04: 510 mg via INTRAVENOUS
  Filled 2019-05-04: qty 17

## 2019-05-04 MED ORDER — AMLODIPINE BESYLATE 10 MG PO TABS
10.0000 mg | ORAL_TABLET | Freq: Every day | ORAL | Status: DC
  Administered 2019-05-04 – 2019-05-07 (×4): 10 mg via ORAL
  Filled 2019-05-04 (×4): qty 1

## 2019-05-04 NOTE — Progress Notes (Signed)
Inpatient Diabetes Program Recommendations  AACE/ADA: New Consensus Statement on Inpatient Glycemic Control (2015)  Target Ranges:  Prepandial:   less than 140 mg/dL      Peak postprandial:   less than 180 mg/dL (1-2 hours)      Critically ill patients:  140 - 180 mg/dL   Lab Results  Component Value Date   GLUCAP 204 (H) 05/04/2019   HGBA1C 10.0 (H) 01/15/2019    Review of Glycemic Control Results for [name], [name] (MRN [id number]) as of 05/04/2019 09:04  Ref. Range 05/03/2019 12:19 05/03/2019 16:18 05/03/2019 22:13 05/04/2019 07:53  Glucose-Capillary Latest Ref Range: 70 - 99 mg/dL 340 (H) 335 (H) 305 (H) 204 (H)   Diabetes history: Type 2 DM Outpatient Diabetes medications: Humalog 5-10 units TID, Lantus 28 units QHS Current orders for Inpatient glycemic control: Novolog 0-15 units TID, Lantus 28 units QHS Decadron 10 mg x 1 on 05/02/19  Inpatient Diabetes Program Recommendations:    A1C pending. Followed by Dr Gabriel Carina, outpatient endocrinology with last appointment on 04/15/19. At that time, A1C was 8.8%.  Consider adding Novolog 4 units TID (assuming patient is consuming >50% of meal).   Thanks, Bronson Curb, MSN, RNC-OB Diabetes Coordinator [phone] (8a-5p)

## 2019-05-04 NOTE — Consult Note (Signed)
   [facility] Inpatient Consult   05/04/2019  [name] Nov 24, 1960 [id number]   [facility] active status: Pending  Patient was assessed for post hospital follow up in the [place] with [employer] member benefits.  Patient has been previously outreached by [place] Management Coordinator.  .  Patient will receive an outreach follow up call after transition for assessment of needs for disease and care management.  MD notes reviewed for history and physical. Will follow with inpatient [facility] staff for post hospital needs at transition.    Of note, [facility] Care Management services does not replace or interfere with any services that are arranged by inpatient case management or social work.   For additional questions or referrals please contact:  Natividad Brood, RN BSN [facility] Liaison  [phone] business mobile phone Toll free office [phone]  Fax number: [phone] [name]

## 2019-05-04 NOTE — Progress Notes (Addendum)
Advanced Heart Failure Rounding Note  PCP-Cardiologist: [name], MD   Subjective:    59 y/o male w/ h/o CAD s/p CABG 2005, chronic atrial fibrillation on Eliquis, ESRD s/p renal transplant 2012, andchronic diastolic CHF + RV failure and pulmonary hypertension, brought in for elective RHC given worsening volume overload and poor response to escalation of outpatient diuretics.  RHC 2/8 w/ significantly elevated filling pressures. Admitted for IV diuresis.   Good response to 80 IV lasix bid. -3.7 L out.   SCr improved, 1.86>>1.71.   Feels better today. No resting dyspnea. Abdomen less distended.   BP elevated. SBPs 160s-180s.   Objective:   Weight Range: 120.7 kg Body mass index is 38.17 kg/m.   Vital Signs:   Temp:  [97.8 F (36.6 C)-98.1 F (36.7 C)] 97.8 F (36.6 C) (02/09 0658) Pulse Rate:  [54-91] 66 (02/09 0658) Resp:  [8-27] 20 (02/09 0658) BP: (109-176)/(49-103) 169/70 (02/09 0658) SpO2:  [94 %-98 %] 96 % (02/09 0658) FiO2 (%):  [21 %] 21 % (02/08 0959)    Weight change: Filed Weights   05/03/19 0753  Weight: 120.7 kg    Intake/Output:   Intake/Output Summary (Last 24 hours) at 05/04/2019 0813 Last data filed at 05/04/2019 0517 Gross per 24 hour  Intake 187.17 ml  Output 3725 ml  Net -3537.83 ml      Physical Exam    General:  Well appearing male. No resp difficulty HEENT: Normal Neck: Supple. Mildly elevated JVP . Carotids 2+ bilat; no bruits. No lymphadenopathy or thyromegaly appreciated. Cor: PMI nondisplaced. irregularly irregular rhythm, regular rate. No rubs, gallops or murmurs. Lungs: Clear Abdomen: obese, soft, nontender, mildly distended but improved. No hepatosplenomegaly. No bruits or masses. Good bowel sounds. Extremities: No cyanosis, clubbing, rash, no LEE edema, + bilateral LE venous stasis dermatitis, + AVF in LUE + thrill  Neuro: Alert & orientedx3, cranial nerves grossly intact. moves all 4 extremities w/o difficulty.  Affect pleasant   Telemetry   Atrial fibrillation 80s-90s   EKG    No new EKG to review   Labs    CBC Recent Labs    05/01/19 1252 05/03/19 0940 05/03/19 0944 05/03/19 1307  WBC 8.0  --   --  4.7  NEUTROABS 6.0  --   --  3.6  HGB 10.0*   < > 10.5* 9.8*  HCT 33.7*   < > 31.0* 32.7*  MCV 74.9*  --   --  74.5*  PLT 145*  --   --  146*   < > = values in this interval not displayed.   Basic Metabolic Panel Recent Labs    05/03/19 1307 05/04/19 0319  NA 136 139  K 4.8 4.4  CL 101 103  CO2 22 26  GLUCOSE 351* 286*  BUN 87* 85*  CREATININE 1.86* 1.71*  CALCIUM 9.6 9.8   Liver Function Tests Recent Labs    05/01/19 1252 05/03/19 1307  AST 16 15  ALT 10 10  ALKPHOS 106 105  BILITOT 1.3* 0.7  PROT 8.0 7.1  ALBUMIN 4.0 3.3*   No results for input(s): LIPASE, AMYLASE in the last 72 hours. Cardiac Enzymes No results for input(s): CKTOTAL, CKMB, CKMBINDEX, TROPONINI in the last 72 hours.  BNP: BNP (last 3 results) Recent Labs    01/16/19 0534 05/03/19 1307  BNP 125.0* 109.2*    ProBNP (last 3 results) No results for input(s): PROBNP in the last 8760 hours.   D-Dimer No results for  input(s): DDIMER in the last 72 hours. Hemoglobin A1C No results for input(s): HGBA1C in the last 72 hours. Fasting Lipid Panel No results for input(s): CHOL, HDL, LDLCALC, TRIG, CHOLHDL, LDLDIRECT in the last 72 hours. Thyroid Function Tests Recent Labs    05/03/19 1307  TSH 1.615    Other results:   Imaging    CARDIAC CATHETERIZATION  Result Date: 05/03/2019 1. Severely elevated right and left heart filling pressures. 2. Severe pulmonary hypertension, likely primarily pulmonary venous hypertension. 3. Preserved cardiac output.   ECHOCARDIOGRAM COMPLETE  Result Date: 05/03/2019    ECHOCARDIOGRAM REPORT   Patient Name:   [name] Date of Exam: 05/03/2019 Medical Rec #:  [id number]      Height:       70.0 in Accession #:    [id number]     Weight:       266.0  lb Date of Birth:  1960/08/25      BSA:          2.36 m Patient Age:    77 years       BP:           128/64 mmHg Patient Gender: M              HR:           65 bpm. Exam Location:  Inpatient Procedure: 2D Echo, Cardiac Doppler, Color Doppler and Intracardiac            Opacification Agent Indications:    CHF- Acute Diastolic 503.54  History:        Patient has prior history of Echocardiogram examinations, most                 recent 01/16/2019. CHF, CAD, Prior CABG, Pulmonary HTN,                 Arrythmias:Atrial Fibrillation; Risk Factors:Dyslipidemia,                 Diabetes, Hypertension and Former Smoker.  Sonographer:    Vickie Epley RDCS Referring Phys: [place]  1. Left ventricular ejection fraction, by estimation, is 45 to 50%. The left ventricle has mildly decreased function. The left ventrical demonstrates global hypokinesis. The left ventricular internal cavity size was mildly dilated. Left ventricular diastolic parameters are indeterminate.  2. Right ventricular systolic function is moderately reduced. The right ventricular size is moderately enlarged. There is moderately elevated pulmonary artery systolic pressure. The estimated right ventricular systolic pressure is 65.6 mmHg. D-shaped interventricular septum suggests RV pressure/volume overload.  3. The aortic valve is tricuspid. No significant regurgitation or stenosis.  4. The mitral valve is degenerative. No significant regurgitation or stenosis.  5. Dilated IVC suggestive of RA pressure 15 mmHg. FINDINGS  Left Ventricle: Left ventricular ejection fraction, by estimation, is 45 to 50%. The left ventricle has mildly decreased function. The left ventricle demonstrates global hypokinesis. Definity contrast agent was given IV to delineate the left ventricular  endocardial borders. The left ventricular internal cavity size was mildly dilated. There is no left ventricular hypertrophy. Right Ventricle: The right ventricular size  is moderately enlarged. No increase in right ventricular wall thickness. Right ventricular systolic function is moderately reduced. There is moderately elevated pulmonary artery systolic pressure. The tricuspid  regurgitant velocity is 3.11 m/s, and with an assumed right atrial pressure of 15 mmHg, the estimated right ventricular systolic pressure is 81.2 mmHg. Left Atrium: Left atrial size was moderately dilated. Right  Atrium: Right atrial size was mildly dilated. Pericardium: There is no evidence of pericardial effusion. Mitral Valve: The mitral valve is degenerative in appearance. Moderate mitral annular calcification. No evidence of mitral valve regurgitation. No evidence of mitral valve stenosis. MV peak gradient, 12.0 mmHg. The mean mitral valve gradient is 4.0 mmHg. Tricuspid Valve: The tricuspid valve is normal in structure. Tricuspid valve regurgitation is trivial. Aortic Valve: The aortic valve is tricuspid. Aortic valve regurgitation is not visualized. Mild aortic valve sclerosis is present, with no evidence of aortic valve stenosis. Pulmonic Valve: The pulmonic valve was normal in structure. Pulmonic valve regurgitation is not visualized. Aorta: The aortic root is normal in size and structure. Venous: The inferior vena cava is dilated in size with less than 50% respiratory variability, suggesting right atrial pressure of 15 mmHg. IAS/Shunts: No atrial level shunt detected by color flow Doppler.  LEFT VENTRICLE PLAX 2D LVIDd:         6.52 cm LVIDs:         5.24 cm LV PW:         1.05 cm LV IVS:        1.02 cm LVOT diam:     2.10 cm LV SV:         79.32 ml LV SV Index:   34.40 LVOT Area:     3.46 cm  LV Volumes (MOD) LV area d, A2C:    53.70 cm LV area d, A4C:    51.20 cm LV area s, A2C:    39.60 cm LV area s, A4C:    37.20 cm LV major d, A2C:   9.87 cm LV major d, A4C:   9.83 cm LV major s, A2C:   9.26 cm LV major s, A4C:   8.65 cm LV vol d, MOD A2C: 240.0 ml LV vol d, MOD A4C: 220.0 ml LV vol s, MOD  A2C: 145.0 ml LV vol s, MOD A4C: 133.0 ml LV SV MOD A2C:     95.0 ml LV SV MOD A4C:     220.0 ml LV SV MOD BP:      87.1 ml RIGHT VENTRICLE TAPSE (M-mode): 1.1 cm LEFT ATRIUM              Index       RIGHT ATRIUM           Index LA diam:        5.90 cm  2.50 cm/m  RA Area:     24.20 cm LA Vol (A2C):   91.6 ml  38.87 ml/m RA Volume:   77.00 ml  32.68 ml/m LA Vol (A4C):   100.0 ml 42.44 ml/m LA Biplane Vol: 95.6 ml  40.57 ml/m  AORTIC VALVE LVOT Vmax:   118.00 cm/s LVOT Vmean:  76.900 cm/s LVOT VTI:    0.229 m  AORTA Ao Root diam: 3.20 cm MITRAL VALVE            TRICUSPID VALVE MV Peak grad: 12.0 mmHg TR Peak grad:   38.7 mmHg MV Mean grad: 4.0 mmHg  TR Vmax:        311.00 cm/s MV Vmax:      1.73 m/s MV Vmean:     86.3 cm/s SHUNTS                         Systemic VTI:  0.23 m  Systemic Diam: 2.10 cm Loralie Champagne MD Electronically signed by Loralie Champagne MD Signature Date/Time: 05/03/2019/6:17:08 PM    Final       Medications:     Scheduled Medications: . amLODipine  5 mg Oral Daily  . apixaban  5 mg Oral BID  . buPROPion  150 mg Oral BID  . ferrous sulfate  650 mg Oral BID WC  . furosemide  80 mg Intravenous BID  . gabapentin  300 mg Oral QHS  . insulin aspart  0-15 Units Subcutaneous TID WC  . insulin glargine  28 Units Subcutaneous QHS  . lisinopril  20 mg Oral Daily  . montelukast  10 mg Oral QHS  . multivitamin with minerals  1 tablet Oral Daily  . mycophenolate  360 mg Oral BID  . pantoprazole  40 mg Oral Daily  . rosuvastatin  10 mg Oral Daily  . sodium chloride flush  3 mL Intravenous Q12H  . sodium chloride flush  3 mL Intravenous Q12H  . spironolactone  25 mg Oral Daily  . tacrolimus  1 mg Oral BID  . tadalafil  40 mg Oral Daily  . tamsulosin  0.4 mg Oral Daily     Infusions: . sodium chloride    . sodium chloride 10 mL/hr at 05/03/19 0817  . sodium chloride       PRN Medications:  sodium chloride, sodium chloride, acetaminophen, albuterol,  colchicine, ondansetron (ZOFRAN) IV, sodium chloride flush, sodium chloride flush, traMADol    Assessment/Plan   1.Acute onchronic diastolic CHF: With prominent RV failure/pulmonary hypertension. ?If RV failure is related to severe OSA, he was a remote smoker. Echo in 3/20 with EF 60-65%, mildly dilated RV with mildly decreased systolic function, severe pulmonary hypertension. Repeat Echo 10/20, EF mildly reduced from prior, down to 50-55%, RV not well visualized.  Recently, he has had progressive CHF symptoms with worsening orthopnea.  RHC 2/8 with significantly elevated right and left heart filling pressures and primarily pulmonary venous hypertension, CO preserved.  - Responding well to IV Lasix 80 bid, -3.7L in UOP. SCr improving.  - Continue IV Lasix today.  - Continue Spironolactone 25 mg daily - Consider Cardiomems placement (already on a/c) - CXR today  Pending - Once we get him near euvolemic w/ reassess w/ ReDs Clip to ensure he is adequately diuresed prior to d/c (ReDs clip measurement was 51% on 2/1) 2. Atrial fibrillation: Chronic. Rate controlled. - not on?blocker due to h/o bradycardia  - continue Eliquis, 5 mg bid  3. CAD: S/p CABG 2005. Low risk Cardiolite in 1/20. Denies CP. - No ASA given Eliquis use.  - Continue Crestor 10 mg daily - no?blocker due to h/o bradycardia 4. CKD: Stage 3. S/p renal transplantation in 2012, followed by nephrology. -He remains on mycophenolate and tacrolimus, will continue.  - SCr trending down w/ diuresis, 1.9>>1.7. Continue BMET daily  5. OSA: On Bipap, reports full compliance -ContinueBipap QHS 6. HTN: BP elevated. SBP 160s-180s.  Lisinopril dose reduced down to 20 mg to allow for agressive IV diuresis (CKD) - Can consider increasing amlodipine to 10 mg daily for better control. - Continue lower dose lisinopril, 20 mg daily, while aggressively diuresing until we see how creatinine trends.  7. Pulmonary hypertension:  Severe pulmonary hypertension by 3/20 echo. Given his gender, most likely this is a combination of group 2 (pulmonary venous hypertension) and group 3 (OSA, ?unrecognized OHS). On RHC 2/8, primarily pulmonary venous hypertension.  Of note, he was started on  tadafil at the [facility] cardiology clinic.  - Can continue tadalafil for now but would hold off on additional [place] medications as I do not think this is likely group 1 PH.  8. DM: CBGs elevated 200-300 range. HgA1c pending.  - Continue insulin.  ? Addition of SGLT2i given concomitant HF (GFR 45) 9. IDA: chronic. Hgb 9.8 (baseline 10-11) - Fe low at 32. Sats ratio 7%. Ferritin nl at 37 - on ferrous sulfate as OP - Consider dose of feraheme    Length of Stay: 1  Brittainy Simmons, PA-C  05/04/2019, 8:13 AM  Advanced Heart Failure Team Pager [phone] (M-F; 7a - 4p)  Please contact [facility] for night-coverage after hours (4p -7a ) and weekends on [email or web address]  Patient seen with PA, agree with the above note.   RHC yesterday with elevated right and left heart filling pressures.    Echo: EF 45-50%, mild LV dilation, moderate RV dilation with moderately decreased systolic function.  D-shaped septum. IVC dilated.  PASP 54 mmHg.   He diuresed well yesterday, creatinine stable at 1.7 and weight coming down.   Gout pain in right knee.   General: NAD Neck: JVP 12-14 cm, no thyromegaly or thyroid nodule.  Lungs: Clear to auscultation bilaterally with normal respiratory effort. CV: Nondisplaced PMI.  Heart irregular S1/S2, no S3/S4, no murmur.  No peripheral edema.   Abdomen: Soft, nontender, no hepatosplenomegaly, mild distention.  Skin: Intact without lesions or rashes.  Neurologic: Alert and oriented x 3.  Psych: Normal affect. Extremities: No clubbing or cyanosis.  HEENT: Normal.   He is still volume overloaded, continue Lasix 80 mg IV bid today and follow creatinine closely.   BP is significantly elevated, increase amlodipine to 10  mg daily .   Will start colchicine for gout in knee (was diagnosed by synovial fluid evaluation).   Loralie Champagne 05/04/2019 11:56 AM

## 2019-05-05 ENCOUNTER — Encounter (HOSPITAL_COMMUNITY): Payer: Self-pay | Admitting: Cardiology

## 2019-05-05 LAB — GLUCOSE, CAPILLARY
Glucose-Capillary: 129 mg/dL — ABNORMAL HIGH (ref 70–99)
Glucose-Capillary: 155 mg/dL — ABNORMAL HIGH (ref 70–99)
Glucose-Capillary: 244 mg/dL — ABNORMAL HIGH (ref 70–99)
Glucose-Capillary: 146 mg/dL — ABNORMAL HIGH (ref 70–99)

## 2019-05-05 LAB — BASIC METABOLIC PANEL
Anion gap: 9 (ref 5–15)
BUN: 73 mg/dL — ABNORMAL HIGH (ref 6–20)
CO2: 27 mmol/L (ref 22–32)
Calcium: 9.6 mg/dL (ref 8.9–10.3)
Chloride: 102 mmol/L (ref 98–111)
Creatinine, Ser: 1.54 mg/dL — ABNORMAL HIGH (ref 0.61–1.24)
GFR calc Af Amer: 56 mL/min — ABNORMAL LOW (ref >60)
GFR calc non Af Amer: 49 mL/min — ABNORMAL LOW (ref >60)
Glucose, Bld: 196 mg/dL — ABNORMAL HIGH (ref 70–99)
Potassium: 3.8 mmol/L (ref 3.5–5.1)
Sodium: 138 mmol/L (ref 135–145)

## 2019-05-05 NOTE — Progress Notes (Addendum)
Advanced Heart Failure Rounding Note  PCP-Cardiologist: [name], MD   Subjective:    59 y/o male w/ h/o CAD s/p CABG 2005, chronic atrial fibrillation on Eliquis, ESRD s/p renal transplant 2012, andchronic diastolic CHF + RV failure and pulmonary hypertension, brought in for elective RHC given worsening volume overload and poor response to escalation of outpatient diuretics.  RHC 2/8 w/ significantly elevated filling pressures. Admitted for IV diuresis.   Marked improvement. Good response to IV Lasix w/ additional -4.2L out yesterday.  Wt down 9 lb. Abdomen less distended. Dyspnea improved.   SCr continues to improve, 1.86>>1.71>>1.54  Got dose of feraheme yesterday. Energy much improved.    Objective:   Weight Range: 116.6 kg Body mass index is 36.89 kg/m.   Vital Signs:   Temp:  [97.7 F (36.5 C)-98.2 F (36.8 C)] 97.7 F (36.5 C) (02/10 0458) Pulse Rate:  [62-67] 62 (02/10 0458) Resp:  [17-18] 18 (02/10 0458) BP: (123-145)/(56-73) 130/73 (02/10 0458) SpO2:  [95 %-97 %] 96 % (02/10 0458) Weight:  [116.6 kg] 116.6 kg (02/10 0500) Last BM Date: 05/04/19  Weight change: Filed Weights   05/03/19 0753 05/04/19 0918 05/05/19 0500  Weight: 120.7 kg 119.7 kg 116.6 kg    Intake/Output:   Intake/Output Summary (Last 24 hours) at 05/05/2019 1114 Last data filed at 05/05/2019 0100 Gross per 24 hour  Intake 600 ml  Output 4200 ml  Net -3600 ml      Physical Exam    General:  Well appearing male. NAD HEENT: Normal Neck: Supple. Mildly elevated JVP . Carotids 2+ bilat; no bruits. No lymphadenopathy or thyromegaly appreciated. Cor: PMI nondisplaced. irregularly irregular rhythm, regular rate. No rubs, gallops or murmurs. Lungs: Clear Abdomen: , soft, nontender, nondistended No hepatosplenomegaly. No bruits or masses. Good bowel sounds. Extremities: No cyanosis, clubbing, rash, no LEE edema, + bilateral LE venous stasis dermatitis, + AVF in LUE + thrill    Neuro: Alert & orientedx3, cranial nerves grossly intact. moves all 4 extremities w/o difficulty. Affect pleasant   Telemetry   Atrial fibrillation 80s-90s   EKG    No new EKG to review   Labs    CBC Recent Labs    05/03/19 0944 05/03/19 1307  WBC  --  4.7  NEUTROABS  --  3.6  HGB 10.5* 9.8*  HCT 31.0* 32.7*  MCV  --  74.5*  PLT  --  546*   Basic Metabolic Panel Recent Labs    05/04/19 0319 05/05/19 0404  NA 139 138  K 4.4 3.8  CL 103 102  CO2 26 27  GLUCOSE 286* 196*  BUN 85* 73*  CREATININE 1.71* 1.54*  CALCIUM 9.8 9.6   Liver Function Tests Recent Labs    05/03/19 1307  AST 15  ALT 10  ALKPHOS 105  BILITOT 0.7  PROT 7.1  ALBUMIN 3.3*   No results for input(s): LIPASE, AMYLASE in the last 72 hours. Cardiac Enzymes No results for input(s): CKTOTAL, CKMB, CKMBINDEX, TROPONINI in the last 72 hours.  BNP: BNP (last 3 results) Recent Labs    01/16/19 0534 05/03/19 1307  BNP 125.0* 109.2*    ProBNP (last 3 results) No results for input(s): PROBNP in the last 8760 hours.   D-Dimer No results for input(s): DDIMER in the last 72 hours. Hemoglobin A1C Recent Labs    05/04/19 0319  HGBA1C 8.2*   Fasting Lipid Panel No results for input(s): CHOL, HDL, LDLCALC, TRIG, CHOLHDL, LDLDIRECT in the last  72 hours. Thyroid Function Tests Recent Labs    05/03/19 1307  TSH 1.615    Other results:   Imaging    No results found.   Medications:     Scheduled Medications: . amLODipine  10 mg Oral Daily  . apixaban  5 mg Oral BID  . buPROPion  150 mg Oral BID  . colchicine  0.6 mg Oral Daily  . ferrous sulfate  650 mg Oral BID WC  . furosemide  80 mg Intravenous BID  . gabapentin  300 mg Oral QHS  . insulin aspart  0-15 Units Subcutaneous TID WC  . insulin glargine  28 Units Subcutaneous QHS  . lisinopril  20 mg Oral Daily  . montelukast  10 mg Oral QHS  . multivitamin with minerals  1 tablet Oral Daily  . mycophenolate  360 mg Oral  BID  . pantoprazole  40 mg Oral Daily  . rosuvastatin  10 mg Oral Daily  . sodium chloride flush  3 mL Intravenous Q12H  . sodium chloride flush  3 mL Intravenous Q12H  . spironolactone  25 mg Oral Daily  . tacrolimus  1 mg Oral BID  . tadalafil  40 mg Oral Daily  . tamsulosin  0.4 mg Oral Daily    Infusions: . sodium chloride    . sodium chloride 10 mL/hr at 05/03/19 0817  . sodium chloride      PRN Medications: sodium chloride, sodium chloride, acetaminophen, albuterol, ondansetron (ZOFRAN) IV, sodium chloride flush, sodium chloride flush, traMADol    Assessment/Plan   1.Acute onchronic diastolic CHF: With prominent RV failure/pulmonary hypertension. ?If RV failure is related to severe OSA, he was a remote smoker. Echo in 3/20 with EF 60-65%, mildly dilated RV with mildly decreased systolic function, severe pulmonary hypertension. Repeat Echo 10/20, EF mildly reduced from prior, down to 50-55%, RV not well visualized.  Recently, he has had progressive CHF symptoms with worsening orthopnea.  RHC 2/8 with significantly elevated right and left heart filling pressures and primarily pulmonary venous hypertension, CO preserved. Admitted for IV diuretics. Echo this admit: EF 45-50%, mild LV dilation, moderate RV dilation with moderately decreased systolic function.  D-shaped septum. IVC dilated.  PASP 54 mmHg.  - Responding well to IV Lasix 80 bid, additional 4.2L in UOP. SCr improving.  - Close to euvolemia but will continue IV Lasix today 80 bid through today.  - Will repeat ReDs clip measurement tomorrow - Continue Spironolactone 25 mg daily - Will start approval process for Cardiomems placement  2. Atrial fibrillation: Chronic. Rate controlled. - not on?blocker due to h/o bradycardia  - continue Eliquis, 5 mg bid  3. CAD: S/p CABG 2005. Low risk Cardiolite in 1/20. Denies CP. - No ASA given Eliquis use.  - Continue Crestor 10 mg daily - no?blocker due to h/o  bradycardia 4. CKD: Stage 3. S/p renal transplantation in 2012, followed by nephrology. -He remains on mycophenolate and tacrolimus, will continue.  - SCr trending down w/ diuresis, 1.9>>1.7>1.5. Continue BMET daily  5. OSA: On Bipap, reports full compliance -ContinueBipap QHS 6. HTN: BP improved w/ amlodipine increase to 10 mg daily.  Lisinopril dose reduced down to 20 mg to allow for agressive IV diuresis (CKD) 7. Pulmonary hypertension: Severe pulmonary hypertension by 3/20 echo. Given his gender, most likely this is a combination of group 2 (pulmonary venous hypertension) and group 3 (OSA, ?unrecognized OHS). On RHC 2/8, primarily pulmonary venous hypertension.  Of note, he was started on tadafil at  the [facility] cardiology clinic.  - Can continue tadalafil for now but would hold off on additional [name] medications as I do not think this is likely group 1 PH.  8. DM: poorly controlled. CBGs elevated 200-300 range. HgA1c 8.2  - Continue insulin.  ? Addition of SGLT2i given concomitant HF (GFR 49) 9. IDA: chronic. Hgb 9.8 (baseline 10-11) - Fe low at 32. Sats ratio 7%. Ferritin nl at 37 - on ferrous sulfate as OP - received dose of feraheme 2/9 - f/u w/ PCP post discharge  10. Gout: rt knee.  - continue colchicine    Length of Stay: 2  Lyda Jester, PA-C  05/05/2019, 11:14 AM  Advanced Heart Failure Team Pager [phone] (M-F; 7a - 4p)  Please contact [facility] for night-coverage after hours (4p -7a ) and weekends on [email or web address]  Patient seen with PA, agree with the above note.   He is doing better today.  Weight coming down, creatinine lower at 1.5.  Gout pain in knee decreased.   On exam, still JVP 12-14 cm.  No edema. Irregular S1S2.   I will plan on giving him one more day of Lasix 80 mg IV bid then check REDS clip tomorrow.    BP now controlled.   Will work on [employer] for outpatient.   Loralie Champagne 05/05/2019 12:48 PM

## 2019-05-06 LAB — GLUCOSE, CAPILLARY
Glucose-Capillary: 185 mg/dL — ABNORMAL HIGH (ref 70–99)
Glucose-Capillary: 223 mg/dL — ABNORMAL HIGH (ref 70–99)
Glucose-Capillary: 247 mg/dL — ABNORMAL HIGH (ref 70–99)
Glucose-Capillary: 180 mg/dL — ABNORMAL HIGH (ref 70–99)

## 2019-05-06 LAB — BASIC METABOLIC PANEL
Anion gap: 10 (ref 5–15)
BUN: 67 mg/dL — ABNORMAL HIGH (ref 6–20)
CO2: 28 mmol/L (ref 22–32)
Calcium: 9.9 mg/dL (ref 8.9–10.3)
Chloride: 103 mmol/L (ref 98–111)
Creatinine, Ser: 1.51 mg/dL — ABNORMAL HIGH (ref 0.61–1.24)
GFR calc Af Amer: 58 mL/min — ABNORMAL LOW (ref >60)
GFR calc non Af Amer: 50 mL/min — ABNORMAL LOW (ref >60)
Glucose, Bld: 203 mg/dL — ABNORMAL HIGH (ref 70–99)
Potassium: 3.8 mmol/L (ref 3.5–5.1)
Sodium: 141 mmol/L (ref 135–145)

## 2019-05-06 MED ORDER — POTASSIUM CHLORIDE CRYS ER 20 MEQ PO TBCR
20.0000 meq | EXTENDED_RELEASE_TABLET | Freq: Once | ORAL | Status: AC
  Administered 2019-05-06: 20 meq via ORAL
  Filled 2019-05-06: qty 1

## 2019-05-06 NOTE — Progress Notes (Addendum)
Advanced Heart Failure Rounding Note  PCP-Cardiologist: [name], MD   Subjective:    59 y/o male w/ h/o CAD s/p CABG 2005, chronic atrial fibrillation on Eliquis, ESRD s/p renal transplant 2012, andchronic diastolic CHF + RV failure and pulmonary hypertension, brought in for elective RHC given worsening volume overload and poor response to escalation of outpatient diuretics.  RHC 2/8 w/ significantly elevated filling pressures. Admitted for IV diuresis.   Marked improvement. Good response to IV Lasix w/ additional 3.4 out yesterday.  Wt down another 4 lb. 13 lb total. Abdomen less distended. Dyspnea improved but ReDs clip measurement still elevated, although improved, down from 51% last week to 48%.   SCr continues to improve, 1.86>>1.71>>1.54>>1.51.     Still w/ pain in knee despite colchicine.    Objective:   Weight Range: 114.9 kg Body mass index is 36.35 kg/m.   Vital Signs:   Temp:  [97.7 F (36.5 C)-98.2 F (36.8 C)] 97.7 F (36.5 C) (02/11 0451) Pulse Rate:  [60-67] 63 (02/11 0451) Resp:  [15-20] 20 (02/11 0451) BP: (121-153)/(67-80) 153/80 (02/11 0451) SpO2:  [96 %-99 %] 96 % (02/11 0451) Weight:  [114.9 kg] 114.9 kg (02/11 0451) Last BM Date: 05/04/19  Weight change: Filed Weights   05/04/19 0918 05/05/19 0500 05/06/19 0451  Weight: 119.7 kg 116.6 kg 114.9 kg    Intake/Output:   Intake/Output Summary (Last 24 hours) at 05/06/2019 1137 Last data filed at 05/06/2019 1021 Gross per 24 hour  Intake 620 ml  Output 3375 ml  Net -2755 ml      Physical Exam    General:  Well appearing male. Mildly obese. NAD HEENT: Normal Neck: Supple. Mildly elevated JVP . Carotids 2+ bilat; no bruits. No lymphadenopathy or thyromegaly appreciated. Cor: PMI nondisplaced. irregularly irregular rhythm, regular rate. No rubs, gallops or murmurs. Lungs: CTA  Abdomen: soft, nontender, nondistended No hepatosplenomegaly. No bruits or masses. Good bowel  sounds. Extremities: No cyanosis, clubbing, rash, no LEE edema, + bilateral LE venous stasis dermatitis Neuro: Alert & orientedx3, cranial nerves grossly intact. moves all 4 extremities w/o difficulty. Affect pleasant   Telemetry   Atrial fibrillation 60s-70s   EKG    No new EKG to review   Labs    CBC Recent Labs    05/03/19 1307  WBC 4.7  NEUTROABS 3.6  HGB 9.8*  HCT 32.7*  MCV 74.5*  PLT 159*   Basic Metabolic Panel Recent Labs    05/05/19 0404 05/06/19 0347  NA 138 141  K 3.8 3.8  CL 102 103  CO2 27 28  GLUCOSE 196* 203*  BUN 73* 67*  CREATININE 1.54* 1.51*  CALCIUM 9.6 9.9   Liver Function Tests Recent Labs    05/03/19 1307  AST 15  ALT 10  ALKPHOS 105  BILITOT 0.7  PROT 7.1  ALBUMIN 3.3*   No results for input(s): LIPASE, AMYLASE in the last 72 hours. Cardiac Enzymes No results for input(s): CKTOTAL, CKMB, CKMBINDEX, TROPONINI in the last 72 hours.  BNP: BNP (last 3 results) Recent Labs    01/16/19 0534 05/03/19 1307  BNP 125.0* 109.2*    ProBNP (last 3 results) No results for input(s): PROBNP in the last 8760 hours.   D-Dimer No results for input(s): DDIMER in the last 72 hours. Hemoglobin A1C Recent Labs    05/04/19 0319  HGBA1C 8.2*   Fasting Lipid Panel No results for input(s): CHOL, HDL, LDLCALC, TRIG, CHOLHDL, LDLDIRECT in the last 72  hours. Thyroid Function Tests Recent Labs    05/03/19 1307  TSH 1.615    Other results:   Imaging    No results found.   Medications:     Scheduled Medications: . amLODipine  10 mg Oral Daily  . apixaban  5 mg Oral BID  . buPROPion  150 mg Oral BID  . colchicine  0.6 mg Oral Daily  . ferrous sulfate  650 mg Oral BID WC  . furosemide  80 mg Intravenous BID  . gabapentin  300 mg Oral QHS  . insulin aspart  0-15 Units Subcutaneous TID WC  . insulin glargine  28 Units Subcutaneous QHS  . lisinopril  20 mg Oral Daily  . montelukast  10 mg Oral QHS  . multivitamin with  minerals  1 tablet Oral Daily  . mycophenolate  360 mg Oral BID  . pantoprazole  40 mg Oral Daily  . rosuvastatin  10 mg Oral Daily  . sodium chloride flush  3 mL Intravenous Q12H  . sodium chloride flush  3 mL Intravenous Q12H  . spironolactone  25 mg Oral Daily  . tacrolimus  1 mg Oral BID  . tadalafil  40 mg Oral Daily  . tamsulosin  0.4 mg Oral Daily    Infusions: . sodium chloride    . sodium chloride 10 mL/hr at 05/03/19 0817  . sodium chloride      PRN Medications: sodium chloride, sodium chloride, acetaminophen, albuterol, ondansetron (ZOFRAN) IV, sodium chloride flush, sodium chloride flush, traMADol    Assessment/Plan   1.Acute onchronic diastolic CHF: With prominent RV failure/pulmonary hypertension. ?If RV failure is related to severe OSA, he was a remote smoker. Echo in 3/20 with EF 60-65%, mildly dilated RV with mildly decreased systolic function, severe pulmonary hypertension. Repeat Echo 10/20, EF mildly reduced from prior, down to 50-55%, RV not well visualized.  Recently, he has had progressive CHF symptoms with worsening orthopnea.  RHC 2/8 with significantly elevated right and left heart filling pressures and primarily pulmonary venous hypertension, CO preserved. Admitted for IV diuretics. Echo this admit: EF 45-50%, mild LV dilation, moderate RV dilation with moderately decreased systolic function.  D-shaped septum. IVC dilated.  PASP 54 mmHg.  - Responding well to IV Lasix 80 bid, additional 3.4L out in UOP. SCr improving.  - ReDs Clip measurement still high at 48%. - Continue IV Lasix 80 bid   - Continue Spironolactone 25 mg daily - ? Addition of an SGLT2i  - Will start approval process for Cardiomems placement  2. Atrial fibrillation: Chronic. Rate controlled. - not on?blocker due to h/o bradycardia  - continue Eliquis, 5 mg bid  3. CAD: S/p CABG 2005. Low risk Cardiolite in 1/20. Denies CP. - No ASA given Eliquis use.  - Continue Crestor 10 mg  daily - no?blocker due to h/o bradycardia 4. CKD: Stage 3. S/p renal transplantation in 2012, followed by nephrology. -He remains on mycophenolate and tacrolimus, ill continue.  - SCr trending down w/ diuresis, 1.9>>1.7>1.5. Continue BMET daily  5. OSA: On Bipap, reports full compliance -ContinueBipap QHS 6. HTN: BP improved w/ amlodipine increase to 10 mg daily.  Lisinopril dose reduced down to 20 mg to allow for agressive IV diuresis (CKD) 7. Pulmonary hypertension: Severe pulmonary hypertension by 3/20 echo. Given his gender, most likely this is a combination of group 2 (pulmonary venous hypertension) and group 3 (OSA, ?unrecognized OHS). On RHC 2/8, primarily pulmonary venous hypertension.  Of note, he was started on tadafil  at the [facility] cardiology clinic.  - Can continue tadalafil for now but would hold off on additional [place] medications as I do not think this is likely group 1 PH.  8. DM: poorly controlled. CBGs elevated 200-300 range. HgA1c 8.2  - Continue insulin.  ? Addition of SGLT2i given concomitant HF (GFR 49) 9. IDA: chronic. Hgb 9.8 (baseline 10-11) - Fe low at 32. Sats ratio 7%. Ferritin nl at 37 - on ferrous sulfate as OP - received dose of feraheme 2/9 - f/u w/ PCP post discharge  10. Gout: rt knee. - little improvement w colchicine. Continues w/ knee pain - consider increasing dose to 0.6 mg bid vs switch to Prednisone  40 mg qd x 3 days    Length of Stay: [address], PA-C  05/06/2019, 11:37 AM  Advanced Heart Failure Team Pager [phone] (M-F; 7a - 4p)  Please contact [facility] for night-coverage after hours (4p -7a ) and weekends on [email or web address]  Patient seen with PA, agree with the above note.    He diuresed well again with weight coming down.  REDS clip still high at 48%. Breathing getting better.  Creatinine lower.   General: NAD Neck: JVP 12 cm, no thyromegaly or thyroid nodule.  Lungs: Clear to auscultation bilaterally with normal  respiratory effort. CV: Nondisplaced PMI.  Heart irregular S1/S2, no S3/S4, no murmur.  No peripheral edema.   Abdomen: Soft, nontender, no hepatosplenomegaly, no distention.  Skin: Intact without lesions or rashes.  Neurologic: Alert and oriented x 3.  Psych: Normal affect. Extremities: No clubbing or cyanosis.  HEENT: Normal.   He is diuresing steadily and feeling better, still with volume overload by exam and REDS vest.   - Continue IV Lasix today.  - Recheck REDS vest tomorrow.  - Would like to start dapagliflozin 10 mg daily.   Loralie Champagne 05/06/2019 1:34 PM

## 2019-05-06 NOTE — Plan of Care (Signed)
  Problem: Clinical Measurements: Goal: Ability to maintain clinical measurements within normal limits will improve Outcome: Progressing Goal: Cardiovascular complication will be avoided Outcome: Progressing

## 2019-05-06 NOTE — Progress Notes (Signed)
ReDS Vest / Clip - 05/06/19 1134      ReDS Vest / Clip   Station Marker  D    Ruler Value  35    ReDS Value Range  (!) High volume overload    ReDS Actual Value  48    Anatomical Comments  sitting

## 2019-05-06 NOTE — TOC Initial Note (Signed)
Transition of Care [facility]) - Initial/Assessment Note    Patient Details  Name: [name] MRN: [id number] Date of Birth: 20-May-1960  Transition of Care [facility]) CM/SW Contact:    Bethena Roys, RN Phone Number: 05/06/2019, 2:13 PM  Clinical Narrative:   Patient presented for CHF. Prior to admission patient was from home with spouse that works during the day. Patient has transportation to appointments when spouse is off. We did discuss FMLA for wife and they have been working on that. Patient uses durable medical equipment cane. Patient gets his medications via [facility]. Patient discussed that he may need some physical therapy in the home-having difficulty moving due to gout. Case Manager did ask staff RN to place PT/OT consult. Staff RN to print out information for patient regarding diet with gout. Case Manager to continue to follow for additional transition of care needs.            Expected Discharge Plan: [place] Barriers to Discharge: No Barriers Identified   Patient Goals and CMS Choice Patient states their goals for this hospitalization and ongoing recovery are:: "to take a shower"      Expected Discharge Plan and Services Expected Discharge Plan: [place] In-house Referral: NA Discharge Planning Services: CM Consult   Living arrangements for the past 2 months: Single Family Home                   Prior Living Arrangements/Services Living arrangements for the past 2 months: Single Family Home Lives with:: Spouse Patient language and need for interpreter reviewed:: Yes Do you feel safe going back to the place where you live?: Yes      Need for Family Participation in Patient Care: Yes (Comment) Care giver support system in place?: Yes (comment)      Activities of Daily Living Home Assistive Devices/Equipment: Cane (specify quad or straight), Eyeglasses ADL Screening (condition at time  of admission) Patient's cognitive ability adequate to safely complete daily activities?: Yes Is the patient deaf or have difficulty hearing?: No Does the patient have difficulty seeing, even when wearing glasses/contacts?: No Does the patient have difficulty concentrating, remembering, or making decisions?: No Patient able to express need for assistance with ADLs?: Yes Does the patient have difficulty dressing or bathing?: No Independently performs ADLs?: Yes (appropriate for developmental age) Does the patient have difficulty walking or climbing stairs?: Yes Weakness of Legs: Both Weakness of Arms/Hands: None  Permission Sought/Granted Permission sought to share information with : Family Supports, Case Manager      Emotional Assessment Appearance:: Appears stated age Attitude/Demeanor/Rapport: Gracious Affect (typically observed): Appropriate Orientation: : Oriented to Situation, Oriented to  Time, Oriented to Place, Oriented to Self Alcohol / Substance Use: Not Applicable Psych Involvement: No (comment)  Admission diagnosis:  Acute systolic CHF (congestive heart failure) ([place]) [I50.21] Patient Active Problem List   Diagnosis Date Noted  . Acute systolic CHF (congestive heart failure) ([place]) 05/03/2019  . Pulmonary hypertension due to left heart disease ([place]) 01/20/2019  . Right knee pain 01/20/2019  . Chest pain with high risk of acute coronary syndrome 01/15/2019  . Trigger finger, unspecified little finger 12/23/2018  . Superficial thrombophlebitis 12/23/2018  . Fever 11/10/2018  . SOB (shortness of breath) 11/06/2018  . CHF (congestive heart failure) ([place]) 11/05/2018  . MDD (major depressive disorder), single episode, mild ([place]) 04/29/2018  . Acute gout 03/30/2018  . Cervical sprain, initial encounter 03/02/2018  .  Moderate persistent asthma 05/21/2017  . Cough 02/10/2017  . Chronic diastolic heart failure ([place]) 11/19/2016  . Chronic venous insufficiency 06/05/2016  .  Chronic ulcer of left leg ([place]) 10/26/2015  . Cytomegalovirus (CMV) viremia ([place]) 09/07/2015  . Proteinuria 06/23/2015  . Hypokalemia 10/13/2014  . BPH with obstruction/lower urinary tract symptoms 09/25/2014  . Hypogonadism in male 09/25/2014  . Immunosuppressed status ([place]) 09/09/2014  . Type 2 diabetes mellitus with hyperglycemia, with long-term current use of insulin ([place]) 09/09/2014  . Chronic atrial fibrillation ([place]) 07/18/2014  . DM eye manif type II 10/27/2013  . MDD (recurrent major depressive disorder) in remission ([place]) 10/04/2013  . Benign neoplasm of colon 04/22/2013  . Diverticulosis of colon (without mention of hemorrhage) 04/22/2013  . History of renal transplant 03/01/2013  . Asthma, persistent controlled 02/25/2013  . Erectile dysfunction 10/20/2012  . Obstructive sleep apnea 09/29/2012  . Obesity hypoventilation syndrome ([place]) 03/31/2012  . Routine general medical examination at a health care facility 11/12/2011  . Type 2 diabetes, controlled, with renal manifestation ([place]) 03/01/2011  . Essential (primary) hypertension 12/17/2010  . CAD, multiple vessel 12/17/2010  . Hyperlipidemia   . ESRD (end stage renal disease) ([place])    PCP:  [name], MD Pharmacy:   [facility] [place] [address] Phone: [phone] Fax: [phone]     Social Determinants of Health (SDOH) Interventions    Readmission Risk Interventions Readmission Risk Prevention Plan 05/06/2019 01/16/2019  Transportation Screening Complete Complete  HRI or Home Care Consult Complete -  Social Work Consult for [place] Planning/Counseling Complete -  Palliative Care Screening Not Applicable Not Applicable  Medication Review Press photographer) Complete Complete  Some recent data might be hidden

## 2019-05-07 LAB — BASIC METABOLIC PANEL
Anion gap: 13 (ref 5–15)
BUN: 68 mg/dL — ABNORMAL HIGH (ref 6–20)
CO2: 28 mmol/L (ref 22–32)
Calcium: 9.6 mg/dL (ref 8.9–10.3)
Chloride: 101 mmol/L (ref 98–111)
Creatinine, Ser: 1.5 mg/dL — ABNORMAL HIGH (ref 0.61–1.24)
GFR calc Af Amer: 58 mL/min — ABNORMAL LOW (ref >60)
GFR calc non Af Amer: 50 mL/min — ABNORMAL LOW (ref >60)
Glucose, Bld: 185 mg/dL — ABNORMAL HIGH (ref 70–99)
Potassium: 3.8 mmol/L (ref 3.5–5.1)
Sodium: 142 mmol/L (ref 135–145)

## 2019-05-07 LAB — GLUCOSE, CAPILLARY: Glucose-Capillary: 144 mg/dL — ABNORMAL HIGH (ref 70–99)

## 2019-05-07 MED ORDER — TORSEMIDE 20 MG PO TABS: ORAL_TABLET | ORAL | 3 refills | Status: DC

## 2019-05-07 MED ORDER — COLCHICINE 0.6 MG PO TABS: 0.6000 mg | ORAL_TABLET | Freq: Every day | ORAL | 11 refills | Status: DC

## 2019-05-07 MED ORDER — LISINOPRIL 40 MG PO TABS: 20.0000 mg | ORAL_TABLET | Freq: Every day | ORAL | 3 refills | Status: DC

## 2019-05-07 MED ORDER — AMLODIPINE BESYLATE 10 MG PO TABS: 10.0000 mg | ORAL_TABLET | Freq: Every day | ORAL | 6 refills | Status: DC

## 2019-05-07 MED ORDER — DAPAGLIFLOZIN PROPANEDIOL 10 MG PO TABS: 10.0000 mg | ORAL_TABLET | Freq: Every day | ORAL | 6 refills | Status: DC

## 2019-05-07 NOTE — Evaluation (Signed)
Occupational Therapy Evaluation Patient Details Name: [name] MRN: [id number] DOB: 1960-10-24 Today's Date: 05/07/2019    History of Present Illness 59yo male stuggling with CHF/volume overload. ECHO shows right ventricular failure and pulmonary HTN. Reds clip measures are high. Received right heart cath 2/8. Admitted for acute on chronic CHF management. PMH CAD s/p CABGx4, gout, A-fib, ESRD, DM, HTN, HLD, obesity, bariatric surgery, cardiac cath   Clinical Impression   Patient is a 59 year old male that lives with his spouse in a single level home with 4 steps to enter. Patient reports he is mostly independent with self care, spouse assists with socks/shoes. OT educate and provide demo of reacher and sock aid for LB dressing, patient return demo. Patient states he wears mostly compression socks, educate patient on compression sock aid and where to purchase. Patient also reports difficulty getting up from toilet at home. Provide visual demo for use of 3 in 1 commode and safe transfer technique for commode over the toilet as well as using in shower as patient is having gout flare in R knee making mobility more difficult. Patient very appreciative. Recommend continued acute OT to maximize patient safety and independence with self care and transfers.    Follow Up Recommendations  No OT follow up    Equipment Recommendations  3 in 1 bedside commode;Other (comment)(Long handled AE including compression sock aid)       Precautions / Restrictions Precautions Precautions: Other (comment) Precaution Comments: R knee pain (easily aggravated) Restrictions Weight Bearing Restrictions: No      Mobility Bed Mobility               General bed mobility comments: out of bed upon arrival  Transfers Overall transfer level: Modified independent Equipment used: Straight cane Transfers: Sit to/from Stand Sit to Stand: Supervision         General transfer comment: increased time and  effort due to R knee pain    Balance Overall balance assessment: No apparent balance deficits (not formally assessed)                                         ADL either performed or assessed with clinical judgement   ADL Overall ADL's : Needs assistance/impaired Eating/Feeding: Independent   Grooming: Modified independent;Standing   Upper Body Bathing: Independent;Sitting   Lower Body Bathing: Minimal assistance;Moderate assistance;Sitting/lateral leans;Sit to/from stand Lower Body Bathing Details (indicate cue type and reason): difficulty reaching feet due to knee pain Upper Body Dressing : Independent;Sitting   Lower Body Dressing: Minimal assistance;Moderate assistance;Sitting/lateral leans Lower Body Dressing Details (indicate cue type and reason): patient reports difficulty with compression socks and shoes at baseline. Demo AE use for LB dressing and discuss with patient compression sock aid Toilet Transfer: Modified Independent;Ambulation;Comfort height toilet Toilet Transfer Details (indicate cue type and reason): patient ambulate into bathroom and transfer onto toilet without physical assist         Functional mobility during ADLs: Modified independent;Cane       Vision Baseline Vision/History: Wears glasses Wears Glasses: At all times              Pertinent Vitals/Pain Pain Assessment: Faces Pain Score: 3  Faces Pain Scale: Hurts a little bit Pain Location: R knee Pain Descriptors / Indicators: Aching Pain Intervention(s): Monitored during session     Hand Dominance Right   Extremity/Trunk Assessment Upper  Extremity Assessment Upper Extremity Assessment: Overall WFL for tasks assessed   Lower Extremity Assessment Lower Extremity Assessment: Defer to PT evaluation   Cervical / Trunk Assessment Cervical / Trunk Assessment: Kyphotic   Communication Communication Communication: No difficulties   Cognition Arousal/Alertness:  Awake/alert Behavior During Therapy: WFL for tasks assessed/performed Overall Cognitive Status: Within Functional Limits for tasks assessed                                                Home Living Family/patient expects to be discharged to:: Private residence Living Arrangements: Spouse/significant other Available Help at Discharge: Family;Available PRN/intermittently Type of Home: House Home Access: Stairs to enter [employer] of Steps: 4 Entrance Stairs-Rails: None Home Layout: One level     Bathroom Shower/Tub: Occupational psychologist: Standard     Home Equipment: Cane - single point          Prior Functioning/Environment Level of Independence: Independent with assistive device(s)        Comments: patient reports he did get stuck on the toilet for an hour because he was unable to get up        OT Problem List: Pain;Decreased knowledge of use of DME or AE;Decreased activity tolerance      OT Treatment/Interventions: Self-care/ADL training;DME and/or AE instruction;Patient/family education;Therapeutic activities    OT Goals(Current goals can be found in the care plan section) Acute Rehab OT Goals Patient Stated Goal: less pain in R knee OT Goal Formulation: With patient Time For Goal Achievement: 05/21/19 Potential to Achieve Goals: Good  OT Frequency: Min 2X/week    AM-PAC OT "6 Clicks" Daily Activity     Outcome Measure Help from another person eating meals?: None Help from another person taking care of personal grooming?: None Help from another person toileting, which includes using toliet, bedpan, or urinal?: None Help from another person bathing (including washing, rinsing, drying)?: A Little Help from another person to put on and taking off regular upper body clothing?: None Help from another person to put on and taking off regular lower body clothing?: A Little 6 Click Score: 22   End of Session Equipment  Utilized During Treatment: Other (comment)(cane) Nurse Communication: Mobility status  Activity Tolerance: Patient tolerated treatment well Patient left: in chair;with call bell/phone within reach;with nursing/sitter in room  OT Visit Diagnosis: Other abnormalities of gait and mobility (R26.89);Pain Pain - Right/Left: Right Pain - part of body: Knee                Time: 3235-5732 OT Time Calculation (min): 14 min Charges:  OT General Charges $OT Visit: 1 Visit OT Evaluation $OT Eval Low Complexity: [place] OT OT office: [place] 05/07/2019, 12:21 PM

## 2019-05-07 NOTE — TOC Transition Note (Addendum)
Transition of Care [facility]) - CM/SW Discharge Note     Patient Details  Name: [name] MRN: [id number] Date of Birth: 06/17/1960  [facility]) CM/SW Contact:  Curlene Labrum, RN Phone Number:  [phone] 05/07/2019, 2:48 PM   Clinical Narrative:    Patient is stable to transition to home today with wife.  Wife is to assist patient with needs at home.  Spouse is a Furniture conservator/restorer and making plans to establish FMLA to assist patient with transportation to physician appointments.  Patient receptive to receiving home health PT assistance at home.  Patient give Medicare choice list - [facility] (per patient choice) unable to provide services due to staffing and [facility] PT/RN.  Call made to [facility] with [facility] and referral accepted for PT and RN to CHF.  Patient has all needed Home DME equipment and denied need for further assistance.  Patient has established Primary Care physician and pharmacy for medications.  No OT needed per OT assessment in notes.  Plan for discharge today once [name] is able to setup PT if possible. Patient filling meds at [facility] outpatient pharmacy.  Final next level of care: [place] Barriers to Discharge: No Barriers Identified   Patient Goals and CMS Choice Patient states their goals for this hospitalization and ongoing recovery are:: Patient wants to get better by ambulating better and learning food not to consume that may worsten gout symptoms. CMS Medicare.gov Compare Post Acute Care list provided to:: Patient Choice offered to / list presented to : Patient  Discharge Placement                 Home with home health PT.        Discharge Plan and Services In-house Referral: NA Discharge Planning Services: CM Consult Post Acute Care Choice: Home Health          DME Arranged: N/A DME Agency: NA       HH Arranged: PT HH Agency: [place] Date [facility] Agency Contacted: 05/07/19 Time  [place]: [id number] Representative spoke with at [place]: Theresia Lo with [place] Determinants of Health ([place]) Interventions     Readmission Risk Interventions Readmission Risk Prevention Plan 05/06/2019 01/16/2019  Transportation Screening Complete Complete  HRI or Home Care Consult Complete -  Social Work Consult for [place] Planning/Counseling Complete -  [place] Screening Not Applicable Not Applicable  Medication Review Press photographer) Complete Complete  Some recent data might be hidden

## 2019-05-07 NOTE — Progress Notes (Signed)
Patient ID: [name], male   DOB: Jun 22, 1960, 59 y.o.   MRN: [id number]     Advanced Heart Failure Rounding Note  PCP-Cardiologist: [name], MD   Subjective:    59 y/o male w/ h/o CAD s/p CABG 2005, chronic atrial fibrillation on Eliquis, ESRD s/p renal transplant 2012, andchronic diastolic CHF + RV failure and pulmonary hypertension, brought in for elective RHC given worsening volume overload and poor response to escalation of outpatient diuretics.  RHC 2/8 w/ significantly elevated filling pressures. Admitted for IV diuresis.   He continues to diurese well with weight coming down.  REDS clip measurement improved.   SCr continues to improve, 1.86>>1.71>>1.54>>1.51>>1.5.     Still w/ pain in knee despite colchicine but able to walk.    Objective:   Weight Range: 114.4 kg Body mass index is 36.19 kg/m.   Vital Signs:   Temp:  [98 F (36.7 C)-98.2 F (36.8 C)] 98 F (36.7 C) (02/12 0324) Pulse Rate:  [52-65] 52 (02/12 0324) Resp:  [16-19] 18 (02/12 0324) BP: (114-132)/(61-66) 114/66 (02/12 0324) SpO2:  [97 %-100 %] 100 % (02/12 0324) Weight:  [114.4 kg] 114.4 kg (02/12 0324) Last BM Date: 05/04/19  Weight change: Filed Weights   05/05/19 0500 05/06/19 0451 05/07/19 0324  Weight: 116.6 kg 114.9 kg 114.4 kg    Intake/Output:   Intake/Output Summary (Last 24 hours) at 05/07/2019 1001 Last data filed at 05/07/2019 0320 Gross per 24 hour  Intake 1040 ml  Output 3875 ml  Net -2835 ml      Physical Exam    General: NAD Neck: No JVD, no thyromegaly or thyroid nodule.  Lungs: Clear to auscultation bilaterally with normal respiratory effort. CV: Nondisplaced PMI.  Heart irregular S1/S2, no S3/S4, no murmur.  No peripheral edema.   Abdomen: Soft, nontender, no hepatosplenomegaly, no distention.  Skin: Intact without lesions or rashes.  Neurologic: Alert and oriented x 3.  Psych: Normal affect. Extremities: No clubbing or cyanosis.  HEENT: Normal.     Telemetry   Atrial fibrillation 60s-70s   EKG    No new EKG to review   Labs    CBC No results for input(s): WBC, NEUTROABS, HGB, HCT, MCV, PLT in the last 72 hours. Basic Metabolic Panel Recent Labs    05/06/19 0347 05/07/19 0301  NA 141 142  K 3.8 3.8  CL 103 101  CO2 28 28  GLUCOSE 203* 185*  BUN 67* 68*  CREATININE 1.51* 1.50*  CALCIUM 9.9 9.6   Liver Function Tests No results for input(s): AST, ALT, ALKPHOS, BILITOT, PROT, ALBUMIN in the last 72 hours. No results for input(s): LIPASE, AMYLASE in the last 72 hours. Cardiac Enzymes No results for input(s): CKTOTAL, CKMB, CKMBINDEX, TROPONINI in the last 72 hours.  BNP: BNP (last 3 results) Recent Labs    01/16/19 0534 05/03/19 1307  BNP 125.0* 109.2*    ProBNP (last 3 results) No results for input(s): PROBNP in the last 8760 hours.   D-Dimer No results for input(s): DDIMER in the last 72 hours. Hemoglobin A1C No results for input(s): HGBA1C in the last 72 hours. Fasting Lipid Panel No results for input(s): CHOL, HDL, LDLCALC, TRIG, CHOLHDL, LDLDIRECT in the last 72 hours. Thyroid Function Tests No results for input(s): TSH, T4TOTAL, T3FREE, THYROIDAB in the last 72 hours.  Invalid input(s): FREET3  Other results:   Imaging    No results found.   Medications:     Scheduled Medications: . amLODipine  10 mg Oral Daily  . apixaban  5 mg Oral BID  . buPROPion  150 mg Oral BID  . colchicine  0.6 mg Oral Daily  . ferrous sulfate  650 mg Oral BID WC  . furosemide  80 mg Intravenous BID  . gabapentin  300 mg Oral QHS  . insulin aspart  0-15 Units Subcutaneous TID WC  . insulin glargine  28 Units Subcutaneous QHS  . lisinopril  20 mg Oral Daily  . montelukast  10 mg Oral QHS  . multivitamin with minerals  1 tablet Oral Daily  . mycophenolate  360 mg Oral BID  . pantoprazole  40 mg Oral Daily  . rosuvastatin  10 mg Oral Daily  . sodium chloride flush  3 mL Intravenous Q12H  . sodium  chloride flush  3 mL Intravenous Q12H  . spironolactone  25 mg Oral Daily  . tacrolimus  1 mg Oral BID  . tadalafil  40 mg Oral Daily  . tamsulosin  0.4 mg Oral Daily    Infusions: . sodium chloride    . sodium chloride 10 mL/hr at 05/03/19 0817  . sodium chloride      PRN Medications: sodium chloride, sodium chloride, acetaminophen, albuterol, ondansetron (ZOFRAN) IV, sodium chloride flush, sodium chloride flush, traMADol    Assessment/Plan   1.Acute onchronic diastolic CHF: With prominent RV failure/pulmonary hypertension. ?If RV failure is related to severe OSA, he was a remote smoker. Echo in 3/20 with EF 60-65%, mildly dilated RV with mildly decreased systolic function, severe pulmonary hypertension. Repeat Echo 10/20, EF mildly reduced from prior, down to 50-55%, RV not well visualized.  Recently, he has had progressive CHF symptoms with worsening orthopnea.  RHC 2/8 with significantly elevated right and left heart filling pressures and primarily pulmonary venous hypertension, CO preserved. Admitted for IV diuretics. Echo this admit: EF 45-50%, mild LV dilation, moderate RV dilation with moderately decreased systolic function, D-shaped septum, IVC dilated, PASP 54 mmHg. He has diuresed well with weight down, REDS clip reading not ideal but down significantly.  Creatinine stable at 1.5.   - Will give 1 more dose of Lasix 80 mg IV this morning.  Will start torsemide 80 qam/40 qpm for home.  - Continue Spironolactone 25 mg daily - Start Farxiga 10 mg daily at home.  - Need to start approval process for Cardiomems placement  2. Atrial fibrillation: Chronic. Rate controlled. - not on?blocker due to h/o bradycardia  - continue Eliquis, 5 mg bid  3. CAD: S/p CABG 2005. Low risk Cardiolite in 1/20. Denies CP. - No ASA given Eliquis use.  - Continue Crestor 10 mg daily - no?blocker due to h/o bradycardia 4. CKD: Stage 3. S/p renal transplantation in 2012, followed by  nephrology.  Creatinine stable at 1.5.  -He remains on mycophenolate and tacrolimus, will continue.  5. OSA: On Bipap, reports full compliance -ContinueBipap QHS 6. HTN: BP improved w/ amlodipine increase to 10 mg daily.  Lisinopril dose reduced down to 20 mg to allow for agressive IV diuresis (CKD).  7. Pulmonary hypertension: Severe pulmonary hypertension by 3/20 echo. Given his gender, most likely this is a combination of group 2 (pulmonary venous hypertension) and group 3 (OSA, ?unrecognized OHS). On RHC 2/8, primarily pulmonary venous hypertension.  Of note, he was started on tadafil at the [facility] cardiology clinic.  - Can continue tadalafil for now but would hold off on additional [place] medications as I do not think this is likely group 1 PH.  May stop in future.  8. DM: poorly controlled. CBGs elevated 200-300 range. HgA1c 8.2  - Continue insulin.  - As above, will start [place] as outpatient.  9. IDA: chronic. Hgb 9.8 (baseline 10-11).   Fe low at 32. Sats ratio 7%. Ferritin nl at 37.  - on ferrous sulfate as OP - received dose of feraheme 2/9 10. Gout: rt knee. Will send home with colchicine to be used until pain improves.  11. Disposition: Home today.  Needs close followup in CHF clinic.  Work on [employer].  Cardiac meds for home: Torsemide 80 qam/40 qpm, amlodipine 10 mg daily, lisinopril 20 mg daily, apixaban 5 mg bid, colchicine 0.6 daily prn gout pain, Farxiga 10 mg daily (see if we can get this for him at home now), Crestor 10 daily, spironolactone 25 daily, tadalafil 40 mg daily, KCl 40 daily.     Length of Stay: [place], [place]  05/07/2019, 10:01 AM  Advanced Heart Failure Team Pager [phone] (M-F; 7a - 4p)  Please contact [facility] for night-coverage after hours (4p -7a ) and weekends on [email or web address]

## 2019-05-07 NOTE — Discharge Summary (Signed)
Advanced Heart Failure Team  Discharge Summary   Patient ID: [name] MRN: [id number], DOB/AGE: 28-Nov-1960 59 y.o. Admit date: 05/03/2019 D/C date:     05/07/2019   Primary Discharge Diagnoses:  1.Acute onchronic diastolic CHF 2. Atrial fibrillation 3. CAD: S/p CABG 2005 4. CKD: Stage 3. S/p renal transplantation in 2012, followed by nephrology. 5. OSA 6. HTN 7. Pulmonary hypertension 8. DM 9. IDA 10. Gout  Hospital Course: 59 y/o male w/ h/o CAD s/p CABG 2005, chronic atrial fibrillation on Eliquis, ESRD s/p renal transplant 2012, andchronic diastolic CHF + RV failure and pulmonary hypertension, brought in for elective RHC given worsening volume overload and poor response to escalation of outpatient diuretics.  RHC 2/8 w/ significantly elevated filling pressures. Admitted for IV diuresis and at discharge transitioned to torsemide 80 mg /40 mg. Renal function was followed closely and remained stable.   Referred to [facility] for PT. No DME was needed. He will continue to be followed in the HF clinic.    See below for detailed problem list.    1.Acute onchronic diastolic CHF: With prominent RV failure/pulmonary hypertension. ?If RV failure is related to severe OSA, he was a remote smoker. Echo in 3/20 with EF 60-65%, mildly dilated RV with mildly decreased systolic function, severe pulmonary hypertension. Repeat Echo 10/20, EF mildly reduced from prior, down to 50-55%, RV not well visualized.Recently, he has had progressive CHF symptoms with worsening orthopnea. RHC 2/8 with significantly elevated right and left heart filling pressures and primarily pulmonary venous hypertension, CO preserved. Admitted for IV diuretics. Echo this admit: EF 45-50%, mild LV dilation, moderate RV dilation with moderately decreased systolic function, D-shaped septum, IVC dilated, PASP 54 mmHg. He has diuresed well with weight down, REDS clip reading not ideal but down significantly.  Creatinine stable at  1.5.   - Placed on torsemide 80 qam/40 qpm for home.  - Continue Spironolactone 25 mg daily - Start Farxiga 10 mg daily at home. CO-Pay 15.00  - Need to start approval process for Cardiomems placement. He will sign consent at his follow up.  2. Atrial fibrillation: Chronic. Rate controlled. - not on?blocker due to h/o bradycardia  - continue Eliquis, 5 mg bid  3. CAD: S/p CABG 2005. Low risk Cardiolite in 1/20. Denies CP. - No ASA given Eliquis use.  - Continue Crestor 10 mg daily - no?blocker due to h/o bradycardia 4. CKD: Stage 3. S/p renal transplantation in 2012, followed by nephrology.  Creatinine stable at 1.5.  -He remains on mycophenolate and tacrolimus, will continue.  5. OSA: On Bipap, reports full compliance -ContinueBipap QHS 6. HTN:BP improved w/ amlodipine increase to 10 mg daily.  Lisinopril dose reduced down to 20 mg to allow for agressive IV diuresis (CKD).  7. Pulmonary hypertension: Severe pulmonary hypertension by 3/20 echo. Given his gender, most likely this is a combination of group 2 (pulmonary venous hypertension) and group 3 (OSA, ?unrecognized OHS). On RHC 2/8, primarily pulmonary venous hypertension. Of note, he was started on tadafil at the [facility] cardiology clinic.  - Can continue tadalafil for now but would hold off on additional [place] medications as I do not think this is likely group 1 PH.May stop in future.  8. DM: poorly controlled. CBGs elevated 200-300 range. HgA1c 8.2  - Continue insulin.  - As above, will start [place] as outpatient.  9. IDA: chronic. Hgb 9.8 (baseline 10-11).   Fe low at 32. Sats ratio 7%. Ferritin nl at 37.  - on  ferrous sulfate as OP - received dose of feraheme 2/9 10. Gout: rt knee. Will send home with colchicine to be used until pain improves.   Discharge Vitals: Blood pressure (!) 145/92, pulse (!) 52, temperature 98 F (36.7 C), temperature source Oral, resp. rate 18, height _0  (1.778 m), weight 114.4 kg, SpO2  100 %.  Labs: Lab Results  Component Value Date   WBC 4.7 05/03/2019   HGB 9.8 (L) 05/03/2019   HCT 32.7 (L) 05/03/2019   MCV 74.5 (L) 05/03/2019   PLT 146 (L) 05/03/2019    Recent Labs  Lab 05/03/19 1307 05/04/19 0319 05/07/19 0301  NA 136   < > 142  K 4.8   < > 3.8  CL 101   < > 101  CO2 22   < > 28  BUN 87*   < > 68*  CREATININE 1.86*   < > 1.50*  CALCIUM 9.6   < > 9.6  PROT 7.1  --   --   BILITOT 0.7  --   --   ALKPHOS 105  --   --   ALT 10  --   --   AST 15  --   --   GLUCOSE 351*   < > 185*   < > = values in this interval not displayed.   Lab Results  Component Value Date   CHOL 89 10/09/2017   HDL 34 (L) 03/06/2016   LDLCALC 49 10/09/2017   TRIG 73 03/06/2016   BNP (last 3 results) Recent Labs    01/16/19 0534 05/03/19 1307  BNP 125.0* 109.2*    ProBNP (last 3 results) No results for input(s): PROBNP in the last 8760 hours.   Diagnostic Studies/Procedures  EHC 05/03/19  RA mean 19 RV 80/20 PA 78/31, 51 PCWP mean 26  Oxygen saturations: PA 65% AO 95%  Cardiac Output (Fick) 7.69  Cardiac Index (Fick) 3.26 PVR 3.25 WU  Discharge Medications   Allergies as of 05/07/2019      Reactions   Morphine Itching   Acetaminophen Other (See Comments)   BC of transplant   Ibuprofen Other (See Comments)   Due to kidney transplant      Medication List    TAKE these medications   albuterol 108 (90 Base) MCG/ACT inhaler Commonly known as: VENTOLIN HFA Inhale 2 puffs every 6 (six) hours as needed into the lungs for wheezing or shortness of breath.   amLODipine 10 MG tablet Commonly known as: NORVASC Take 1 tablet (10 mg total) by mouth daily. Start taking on: May 08, 2019 What changed:   medication strength  how much to take   apixaban 5 MG Tabs tablet Commonly known as: Eliquis Take 1 tablet (5 mg total) by mouth 2 (two) times daily.   beclomethasone 80 MCG/ACT inhaler Commonly known as: Qvar RediHaler Inhale 2 puffs into the  lungs 2 (two) times daily. Rinse mouth after use. What changed:   when to take this  reasons to take this   buPROPion 150 MG 12 hr tablet Commonly known as: WELLBUTRIN SR TAKE 1 TABLET BY MOUTH TWICE DAILY   colchicine 0.6 MG tablet Take 1 tablet (0.6 mg total) by mouth daily. What changed:   when to take this  reasons to take this   dapagliflozin propanediol 10 MG Tabs tablet Commonly known as: FARXIGA Take 10 mg by mouth daily before breakfast.   ferrous sulfate 325 (65 FE) MG EC tablet Take 650 mg by mouth  2 (two) times daily.   fluticasone 50 MCG/ACT nasal spray Commonly known as: FLONASE PLACE 2 SPRAYS INTO BOTH NOSTRILS DAILY. What changed: how much to take   gabapentin 300 MG capsule Commonly known as: NEURONTIN Take 300 mg by mouth at bedtime.   HumaLOG KwikPen 100 UNIT/ML KwikPen Generic drug: insulin lispro Inject 5-10 Units into the skin 3 (three) times daily. Sliding scale   insulin glargine 100 unit/mL Sopn Commonly known as: LANTUS Inject 0.1 mLs (10 Units total) into the skin at bedtime. Currently using 15 units What changed:   how much to take  additional instructions   lisinopril 40 MG tablet Commonly known as: ZESTRIL Take 0.5 tablets (20 mg total) by mouth daily. What changed: how much to take   metolazone 2.5 MG tablet Commonly known as: ZAROXOLYN Take 2.5 mg by mouth daily as needed (fluid retention).   montelukast 10 MG tablet Commonly known as: SINGULAIR TAKE 1 TABLET BY MOUTH AT BEDTIME   multivitamin with minerals Tabs tablet Take 1 tablet by mouth daily.   Myfortic 180 MG EC tablet Generic drug: mycophenolate Take 360 mg by mouth 2 (two) times daily.   omeprazole 20 MG capsule Commonly known as: PRILOSEC TAKE 1 CAPSULE BY MOUTH ONCE DAILY   oxyCODONE 5 MG immediate release tablet Commonly known as: Roxicodone Take 1 tablet (5 mg total) by mouth every 8 (eight) hours as needed.   potassium chloride 10 MEQ CR  capsule Commonly known as: MICRO-K Take 4 capsules (40 mEq total) by mouth every morning AND 2 capsules (20 mEq total) every evening.   rosuvastatin 10 MG tablet Commonly known as: CRESTOR TAKE 1 TABLET BY MOUTH DAILY   spironolactone 25 MG tablet Commonly known as: Aldactone Take 1 tablet (25 mg total) by mouth daily.   tacrolimus 1 MG capsule Commonly known as: PROGRAF Take 1 mg by mouth 2 (two) times daily.   tadalafil (PAH) 20 MG tablet Commonly known as: ADCIRCA Take 2 tablets (40 mg total) by mouth daily. What changed: how much to take   tamsulosin 0.4 MG Caps capsule Commonly known as: FLOMAX Take 0.4 mg by mouth daily.   torsemide 20 MG tablet Commonly known as: DEMADEX Take torsemide 80 mg in am and 40 mg in pm What changed: See the new instructions.   traMADol 50 MG tablet Commonly known as: ULTRAM Take 1 tablet (50 mg total) by mouth 3 (three) times daily as needed. What changed: reasons to take this            Durable Medical Equipment  (From admission, onward)         Start     Ordered   05/07/19 1238  Heart failure home health orders  (Heart failure home health orders / Face to face)  Once    Comments: Heart Failure Follow-up Care:  Verify follow-up appointments per Patient Discharge Instructions. Confirm transportation arranged. Reconcile home medications with discharge medication list. Remove discontinued medications from use. Assist patient/caregiver to manage medications using pill box. Reinforce low sodium food selection Assessments: Vital signs and oxygen saturation at each visit. Assess home environment for safety concerns, caregiver support and availability of low-sodium foods. Consult Education officer, museum, PT/OT, Dietitian, and CNA based on assessments. Perform comprehensive cardiopulmonary assessment. Notify MD for any change in condition or weight gain of 3 pounds in one day or 5 pounds in one week with symptoms. Daily Weights and Symptom  Monitoring: Ensure patient has access to scales. Teach patient/caregiver to  weigh daily before breakfast and after voiding using same scale and record.    Teach patient/caregiver to track weight and symptoms and when to notify Provider. Activity: Develop individualized activity plan with patient/caregiver.  HHPT  Question Answer Comment  Heart Failure Follow-up Care Advanced Heart Failure (AHF) Clinic at [phone]   Obtain the following labs Basic Metabolic Panel   Lab frequency Other see comments   Fax lab results to AHF Clinic at [phone]   Diet Low Sodium Heart Healthy   Fluid restrictions: 2000 mL Fluid      05/07/19 1238          Disposition   The patient will be discharged in stable condition to home. Discharge Instructions    (HEART FAILURE PATIENTS) Call MD:  Anytime you have any of the following symptoms: 1) 3 pound weight gain in 24 hours or 5 pounds in 1 week 2) shortness of breath, with or without a dry hacking cough 3) swelling in the hands, feet or stomach 4) if you have to sleep on extra pillows at night in order to breathe.   Complete by: As directed    Diet - low sodium heart healthy   Complete by: As directed    Heart Failure patients record your daily weight using the same scale at the same time of day   Complete by: As directed    Increase activity slowly   Complete by: As directed      Follow-up Information    [facility] Follow up on 05/18/2019.   Specialty: Cardiology Why: Advanced Heart Failure Clinic _0 :[place] Contact information: [address] [id number] [place] [phone]            Duration of Discharge Encounter: Greater than 35 minutes   Signed, Irja Wheless NP_C  05/07/2019, 1:26 PM

## 2019-05-07 NOTE — Evaluation (Signed)
Physical Therapy Evaluation Patient Details Name: [name] MRN: [id number] DOB: October 25, 1960 Today's Date: 05/07/2019   History of Present Illness  59yo male stuggling with CHF/volume overload. ECHO shows right ventricular failure and pulmonary HTN. Reds clip measures are high. Received right heart cath 2/8. Admitted for acute on chronic CHF management. PMH CAD s/p CABGx4, gout, A-fib, ESRD, DM, HTN, HLD, obesity, bariatric surgery, cardiac cath  Clinical Impression   Patient received in chair, pleasant and willing to work with PT but concerned about his R knee pain- he reports that it has gotten to the point where it is quite limiting for him at home. See below for mobility/assist levels. Able to ambulate in hallway with increased time and effort, limited by R knee aggravation and pain. Practiced steps with crutches, which he was able to perform safely with VC for sequencing without difficulty. Also discussed use of 3 in 1 to raise up the toilet and assist in functional transfers at home. He was left sitting up in the chair with RN present and attending. Currently feel he can return home with HHPT.     Follow Up Recommendations Home health PT    Equipment Recommendations  Crutches;3in1 (PT)    Recommendations for Other Services       Precautions / Restrictions Precautions Precautions: Other (comment) Precaution Comments: R knee pain (easily aggravated) Restrictions Weight Bearing Restrictions: No      Mobility  Bed Mobility               General bed mobility comments: OOB in chair  Transfers Overall transfer level: Needs assistance Equipment used: None Transfers: Sit to/from Stand Sit to Stand: Supervision         General transfer comment: increased time and effort due to R knee pain  Ambulation/Gait Ambulation/Gait assistance: Modified independent (Device/Increase time) Gait Distance (Feet): 250 Feet Assistive device: Straight cane Gait Pattern/deviations:  Step-through pattern;Decreased step length - left;Decreased stance time - right;Decreased dorsiflexion - right;Antalgic Gait velocity: decreased   General Gait Details: gait pattern mildly antalgic due to R knee pain; good balance and sequencing with SPC  Stairs Stairs: Yes Stairs assistance: Supervision Stair Management: No rails;Forwards;With crutches Number of Stairs: 2 General stair comments: VC for correct sequencing and safety, able to do so with S without significant difficulty  Wheelchair Mobility    Modified Rankin (Stroke Patients Only)       Balance Overall balance assessment: No apparent balance deficits (not formally assessed)                                           Pertinent Vitals/Pain Pain Assessment: 0-10 Pain Score: 3  Pain Location: R knee Pain Descriptors / Indicators: Aching Pain Intervention(s): Limited activity within patient's tolerance;Monitored during session    Home Living Family/patient expects to be discharged to:: Private residence Living Arrangements: Spouse/significant other Available Help at Discharge: Family;Available PRN/intermittently Type of Home: House Home Access: Stairs to enter Entrance Stairs-Rails: None Entrance Stairs-Number of Steps: 4 Home Layout: One level Home Equipment: Shower seat;Cane - single point      Prior Function Level of Independence: Independent with assistive device(s)         Comments: doing well with mobility, has been needing to use cane due to knee pain     Hand Dominance        Extremity/Trunk Assessment   Upper  Extremity Assessment Upper Extremity Assessment: Defer to OT evaluation    Lower Extremity Assessment Lower Extremity Assessment: Generalized weakness    Cervical / Trunk Assessment Cervical / Trunk Assessment: Kyphotic  Communication   Communication: No difficulties  Cognition Arousal/Alertness: Awake/alert Behavior During Therapy: WFL for tasks  assessed/performed Overall Cognitive Status: Within Functional Limits for tasks assessed                                        General Comments      Exercises     Assessment/Plan    PT Assessment Patient needs continued PT services  PT Problem List Decreased strength;Decreased range of motion;Decreased knowledge of use of DME;Obesity;Decreased activity tolerance;Pain;Decreased mobility       PT Treatment Interventions DME instruction;Gait training;Stair training;Functional mobility training;Patient/family education;Therapeutic activities;Therapeutic exercise    PT Goals (Current goals can be found in the Care Plan section)  Acute Rehab PT Goals Patient Stated Goal: less pain in R knee PT Goal Formulation: With patient Time For Goal Achievement: 05/21/19 Potential to Achieve Goals: Good    Frequency Min 3X/week   Barriers to discharge        Co-evaluation               AM-PAC PT "6 Clicks" Mobility  Outcome Measure Help needed turning from your back to your side while in a flat bed without using bedrails?: None Help needed moving from lying on your back to sitting on the side of a flat bed without using bedrails?: None Help needed moving to and from a bed to a chair (including a wheelchair)?: A Little Help needed standing up from a chair using your arms (e.g., wheelchair or bedside chair)?: A Little Help needed to walk in hospital room?: A Little Help needed climbing 3-5 steps with a railing? : A Little 6 Click Score: 20    End of Session   Activity Tolerance: Patient tolerated treatment well Patient left: in chair;with call bell/phone within reach;with nursing/sitter in room Nurse Communication: Mobility status PT Visit Diagnosis: Muscle weakness (generalized) (M62.81);Other abnormalities of gait and mobility (R26.89);Pain;Difficulty in walking, not elsewhere classified (R26.2) Pain - Right/Left: Right Pain - part of body: Knee    Time:  3976-7341 PT Time Calculation (min) (ACUTE ONLY): 39 min   Charges:   PT Evaluation $PT Eval Moderate Complexity: 1 Mod PT Treatments $Gait Training: 23-37 mins       Windell Norfolk, DPT, PN1   Supplemental Physical Therapist [place]    Pager [phone] Acute Rehab Office [phone]

## 2019-05-10 ENCOUNTER — Other Ambulatory Visit: Payer: Self-pay | Admitting: *Deleted

## 2019-05-10 NOTE — Patient Outreach (Signed)
[facility]) Care Management  05/10/2019  [name] 11-21-1960 [id number]    Transition of care call  Referral received:05/04/19 Initial outreach:05/10/19 Insurance: [employer] UMR   Subjective: Initial successful telephone call to patient's preferred number in order to complete transition of care assessment; No answer able to leave a HIPAA compliant message for return call.  Placed call to patient wife [name] , [place],  2 HIPAA identifiers verified. Explained purpose of call and to complete  transition of care assessment. She reports patient is home he maybe busy, she will text him and have him return call to me.  Discussed with wife as she is subscriber of insurance plan any concerns . She discussed making contact with [employer] regarding intermittent FMLA  Initial paperwork sent to [facility] Nephrology  department, wife states were  willing to complete forms. Wife states communicating with one representative at [facility] and that had questions about form, wife reports she plans to follow up that representative supervisor as well as [facility] Benefits office. She reports that she does have the Hospital indemnity plans and contact information for filing a claim.  Objective:  [name]  was hospitalized at [facility] from 7/6-2/26/33 for Acute systolic Heart failure  Comorbidities include: Chronic diastolic heart failure, hypertension, pulmonary hypertension, Chronic atrial fib, Type 2 Diabetes A1c 8.2 on 05/04/19, s/p kidney transplant 2012, Gout right knee . He was discharged to home on 05/07/19 with orders for home health physical therapy, RN for CHF management no equipment needs    Plan:  Will await patient return call , if no response will plan return call in the next 4 business days.  Will send unsuccessful outreach letter.     Joylene Draft, RN, BSN  [place] Management Coordinator  [phone]- Mobile [phone]- Toll  Free Main Office

## 2019-05-11 ENCOUNTER — Telehealth: Payer: Self-pay | Admitting: Internal Medicine

## 2019-05-11 ENCOUNTER — Encounter: Payer: Self-pay | Admitting: Internal Medicine

## 2019-05-11 ENCOUNTER — Other Ambulatory Visit: Payer: Self-pay | Admitting: *Deleted

## 2019-05-11 DIAGNOSIS — R269 Unspecified abnormalities of gait and mobility: Secondary | ICD-10-CM

## 2019-05-11 DIAGNOSIS — M109 Gout, unspecified: Secondary | ICD-10-CM

## 2019-05-11 NOTE — Telephone Encounter (Signed)
Kim with [facility] called today She stated she has been following the patient  Patient stated that he has been having problems since discharged from the hospital with Gout in his foot. He is requesting a bedside toilet 3 in 1 to help since he is having trouble getting up and down.     Can you look at this since Dr Silvio Pate is out of office.

## 2019-05-11 NOTE — Telephone Encounter (Signed)
Spoke to pt. He said Dr Silvio Pate had called. He said he is doing better. Knee is still a problem.

## 2019-05-11 NOTE — Telephone Encounter (Signed)
Abnormal gait. R26.9 Gout M10.9.   Thanks.

## 2019-05-11 NOTE — Patient Outreach (Signed)
[name] - [place]) Care Management  05/11/2019  [name] Jun 23, 1960 [id number]   Transition of care call  Referral received:05/06/19 Initial outreach:05/10/19 Insurance: UMR    Subjective: Received return call from patient after outreach attempt on 05/10/19 he reports having some phone difficulties on yesterday.   2 HIPAA identifiers verified. Explained purpose of call and completed transition of care assessment.  [name] states he is doing okay. He reports some improvement in right knee discomfort. He denies shortness of breath , increased in swelling or heart failure symptoms when reviewed.He is able to teachback worsening symptoms of heart to notify clinic of. He denies sudden weight gain, weight yesterday, 250 and today 249 lbs. He reports tolerating diet , report not using salt in cooking, using mrs dash, and other no salt natural seasoning. He discussed having some difficulty with getting down to use commode , asking about commode chair as demonstrated use during therapy session in [name]. Asked patient about home health services arranged at discharge for physical therapy and RN, he states he has not received a phone call yet. He is using a crutch at home for mobility.  He reports missing previously scheduled orthopedic appointment during hospitalization and plans to reschedule.  Spouse is assisting with his recovery.  He discussed chronic conditions of heart failure, diabetes , hypertension he is unsure if enrolled in health coach program with chronic condition management, unable to recall getting a call yet. He is agreeable to referral if not already active.   In speaking with patient wife on 2/15 she reports having  the [name] indemnity plan and contact information .  Patient discussed concern with wife being able to get intermittent FMLA , forms have been initially sent to Nephrology office for short term FMLA , wife has followed up with [employer] and been told  not approved , she is requesting intermittent FMLA to be available for patient appointments. She is attempting contact with supervisor in [employer] to contact. Patient is asking for Fax number for Heart failure clinic as he plans to have forms sent to that office.  He uses a Ambulance person, Lexicographer .  He denies educational needs related to staying safe during the COVID 19 pandemic.    Objective:  [name] was hospitalized [facility] from 4/5-6/25/63 for Acute systolic Heart failure  Comorbidities include: Chronic diastolic heart failure, hypertension, pulmonary hypertension, Chronic atrial fib, Type 2 Diabetes A1c 8.2 on 05/04/19, s/p kidney transplant 2012, Gout right knee . He was discharged to home on 05/07/19 with orders for home health physical therapy, RN for CHF management no equipment needs   Assessment:  Patient voices good understanding of all discharge instructions.  See transition of care flowsheet for assessment details.   Plan:  Reviewed [name] discharge diagnosis of Acute systolic heart failure   and discharge treatment plan using [name] discharge instructions, assessing medication adherence, reviewing problems requiring provider notification, and discussing the importance of follow up with  primary care provider and/or specialists as directed. Reviewed with wife on 05/10/19 [place] healthy lifestyle program information to receive discounted premium for  2022  Step 1: Get annual physical between March 25, 2018 and September 23, 2019; Step 2: Complete your health assessment between March 26, 2019 and November 24, 2019 at [email or web address] Step 3:Identify your current health status and complete the corresponding action step between January 1, and November 24, 2019.   Using [facility] website, in attempt  verify  that patient is an active participate in [place] [employer]  chronic disease management program, sent email message to Adele Dan at Active health management to verify.    Placed call to [facility] home health to verify orders received for home health , spoke with representative Jacqlyn Larsen that verifies they have referral and in process of scheduling start date, verified they have patient contact number.Return call to patient to update Also provided with contact number for benefits specialist that may Myra Rude [phone] or benefits at [email or web address].  they may be able to provide further direction on [employer] situation  Placed call to [facility] office for patient request for bedside commode , 3 in 1 to use over commode due to right knee gout discomfort.  Will plan  call to patient within a week or sooner regarding care coordination follow up.  Unsuccessful outreach letter with [place] Management information has been sent to patient home address on 05/10/19 outreach attempt.  1620 received return call from [place] at [facility] office, they are agreeable to bedside commode order , explained  MD order will need to go to DME agency to supply.   Joylene Draft, RN, BSN  [place] Management Coordinator  [phone]- Mobile [phone]- Toll Free Main Office

## 2019-05-11 NOTE — Telephone Encounter (Signed)
Pt returned dr Alla German call

## 2019-05-11 NOTE — Telephone Encounter (Signed)
Agreed, please give the order.  Thanks.

## 2019-05-11 NOTE — Telephone Encounter (Signed)
[facility] does not order this.  Order will need to be placed with [facility].  Please give diagnosis and I will send the request.

## 2019-05-13 ENCOUNTER — Other Ambulatory Visit: Payer: Self-pay | Admitting: *Deleted

## 2019-05-13 NOTE — Patient Outreach (Signed)
[facility]) Care Management  05/13/2019  [name] [birth year] [id number]   Telephone follow up transition of care    Referral received:05/04/19 Initial outreach:05/10/19 Insurance: [place] UMR   Subjective: Successful follow up call to patient, he reports feeling pretty good. Tolerating mobility in home, reports right knee still bothers him but he is able to get up easier.   Follow up with patient regarding [facility] home health contact with him, he reports that he has not received a phone call yet.   Placed phone call to [facility] home health spoke with representative that states they have attempted unsuccessfully to make contact with patient, his telephone number verified.  Representative will follow up on referral and return call to me   Discussed with patient [employer] management as benefit of [place] insurance plan for continued education and support , patient states he is interested. Discussed unsuccessful attempt from [facility] to contact patient ,he is agreeable to receiving contact number to contact them provided telephone number [phone]. Updated patient PCP office has ordered 3in1 bedside commode from DME agency, encouraged to answer phone for any follow up.   1600  Received return call from [name] at [place], outreach call to patient to verify he is agreeable to speak with [facility] regarding ordered services he is agreeable., 3 way call connection.  Per [name] at [facility] since patient is agreeable to services they will need a new order since it has been over 48 hours  since previous order written.    Plan  Placed call to  Advanced heart failure clinic able to leave a message  of need for update of discharge orders for  home health orders be sent to [facility] [phone] if agreeable, explained [name] had difficulty with making contact with patient post discharge and will need updated order prior to initial visit .  Will plan follow up  call on next identify additional care coordination needs prior to case closure.   Joylene Draft, RN, BSN  [place] Management Coordinator  [phone]- Mobile [phone]- Toll Free Main Office

## 2019-05-14 ENCOUNTER — Telehealth (HOSPITAL_COMMUNITY): Payer: Self-pay | Admitting: *Deleted

## 2019-05-14 DIAGNOSIS — I5032 Chronic diastolic (congestive) heart failure: Secondary | ICD-10-CM

## 2019-05-14 NOTE — Telephone Encounter (Signed)
Kim left VM that pt was d/c'd from hospital last week w/orders for home PT w/[facility], she states that [facility] had been unable to reach pt and have just now gotten in touch with him to sch visit.  She states since it has been >48 hrs since D/C [facility] needs new orders.  Orders placed per Dr Aundra Dubin and faxed to [facility] at [phone]

## 2019-05-18 ENCOUNTER — Encounter (HOSPITAL_COMMUNITY): Payer: Self-pay

## 2019-05-18 ENCOUNTER — Ambulatory Visit (HOSPITAL_COMMUNITY)
Admission: RE | Admit: 2019-05-18 | Discharge: 2019-05-18 | Disposition: A | Discharge status: Home / Self Care | Payer: 59 | Source: Ambulatory Visit | Attending: Adult Health | Admitting: Adult Health

## 2019-05-18 ENCOUNTER — Other Ambulatory Visit: Payer: Self-pay

## 2019-05-18 ENCOUNTER — Telehealth: Payer: Self-pay | Admitting: Internal Medicine

## 2019-05-18 VITALS — BP 146/82 | HR 84 | Wt 260.6 lb

## 2019-05-18 DIAGNOSIS — I10 Essential (primary) hypertension: Secondary | ICD-10-CM | POA: Diagnosis not present

## 2019-05-18 DIAGNOSIS — R42 Dizziness and giddiness: Secondary | ICD-10-CM | POA: Insufficient documentation

## 2019-05-18 DIAGNOSIS — G4733 Obstructive sleep apnea (adult) (pediatric): Secondary | ICD-10-CM | POA: Insufficient documentation

## 2019-05-18 DIAGNOSIS — Z8249 Family history of ischemic heart disease and other diseases of the circulatory system: Secondary | ICD-10-CM | POA: Diagnosis not present

## 2019-05-18 DIAGNOSIS — E1122 Type 2 diabetes mellitus with diabetic chronic kidney disease: Secondary | ICD-10-CM | POA: Diagnosis not present

## 2019-05-18 DIAGNOSIS — I255 Ischemic cardiomyopathy: Secondary | ICD-10-CM | POA: Diagnosis not present

## 2019-05-18 DIAGNOSIS — E785 Hyperlipidemia, unspecified: Secondary | ICD-10-CM | POA: Diagnosis not present

## 2019-05-18 DIAGNOSIS — J45909 Unspecified asthma, uncomplicated: Secondary | ICD-10-CM | POA: Diagnosis not present

## 2019-05-18 DIAGNOSIS — I272 Pulmonary hypertension, unspecified: Secondary | ICD-10-CM | POA: Insufficient documentation

## 2019-05-18 DIAGNOSIS — I5032 Chronic diastolic (congestive) heart failure: Secondary | ICD-10-CM | POA: Diagnosis not present

## 2019-05-18 DIAGNOSIS — M109 Gout, unspecified: Secondary | ICD-10-CM | POA: Diagnosis not present

## 2019-05-18 DIAGNOSIS — Z940 Kidney transplant status: Secondary | ICD-10-CM | POA: Diagnosis not present

## 2019-05-18 DIAGNOSIS — I132 Hypertensive heart and chronic kidney disease with heart failure and with stage 5 chronic kidney disease, or end stage renal disease: Secondary | ICD-10-CM | POA: Diagnosis not present

## 2019-05-18 DIAGNOSIS — Z7901 Long term (current) use of anticoagulants: Secondary | ICD-10-CM | POA: Diagnosis not present

## 2019-05-18 DIAGNOSIS — Z794 Long term (current) use of insulin: Secondary | ICD-10-CM | POA: Diagnosis not present

## 2019-05-18 DIAGNOSIS — I509 Heart failure, unspecified: Secondary | ICD-10-CM | POA: Diagnosis not present

## 2019-05-18 DIAGNOSIS — Z9884 Bariatric surgery status: Secondary | ICD-10-CM | POA: Insufficient documentation

## 2019-05-18 DIAGNOSIS — E669 Obesity, unspecified: Secondary | ICD-10-CM | POA: Insufficient documentation

## 2019-05-18 DIAGNOSIS — I4821 Permanent atrial fibrillation: Secondary | ICD-10-CM | POA: Diagnosis not present

## 2019-05-18 DIAGNOSIS — N186 End stage renal disease: Secondary | ICD-10-CM | POA: Insufficient documentation

## 2019-05-18 DIAGNOSIS — I482 Chronic atrial fibrillation, unspecified: Secondary | ICD-10-CM | POA: Diagnosis not present

## 2019-05-18 DIAGNOSIS — Z87891 Personal history of nicotine dependence: Secondary | ICD-10-CM | POA: Diagnosis not present

## 2019-05-18 DIAGNOSIS — I2510 Atherosclerotic heart disease of native coronary artery without angina pectoris: Secondary | ICD-10-CM | POA: Diagnosis not present

## 2019-05-18 DIAGNOSIS — Z951 Presence of aortocoronary bypass graft: Secondary | ICD-10-CM | POA: Diagnosis not present

## 2019-05-18 LAB — BASIC METABOLIC PANEL
Anion gap: 9 (ref 5–15)
BUN: 49 mg/dL — ABNORMAL HIGH (ref 6–20)
CO2: 25 mmol/L (ref 22–32)
Calcium: 9.5 mg/dL (ref 8.9–10.3)
Chloride: 106 mmol/L (ref 98–111)
Creatinine, Ser: 1.74 mg/dL — ABNORMAL HIGH (ref 0.61–1.24)
GFR calc Af Amer: 49 mL/min — ABNORMAL LOW (ref >60)
GFR calc non Af Amer: 42 mL/min — ABNORMAL LOW (ref >60)
Glucose, Bld: 110 mg/dL — ABNORMAL HIGH (ref 70–99)
Potassium: 4.5 mmol/L (ref 3.5–5.1)
Sodium: 140 mmol/L (ref 135–145)

## 2019-05-18 NOTE — Progress Notes (Signed)
ReDS Vest / Clip - 05/18/19 0900      ReDS Vest / Clip   Station Marker  D    Ruler Value  42    ReDS Value Range  Low volume    ReDS Actual Value  35    Anatomical Comments  sitting

## 2019-05-18 NOTE — Telephone Encounter (Signed)
[name], [name], called.  Patient has declined home physical therapy.

## 2019-05-18 NOTE — Patient Instructions (Signed)
Lab work done today. We will notify you of any abnormal lab work. No news is good news!  HOLD Torsemide dose tonight.  Please follow up with the [facility] in 3 months.  At the [facility], you and your health needs are our priority. As part of our continuing mission to provide you with exceptional heart care, we have created designated Provider Care Teams. These Care Teams include your primary Cardiologist (physician) and Advanced Practice Providers (APPs- Physician Assistants and Nurse Practitioners) who all work together to provide you with the care you need, when you need it.   You may see any of the following providers on your designated Care Team at your next follow up: Marland Kitchen Dr Glori Bickers . Dr Loralie Champagne . Darrick Grinder, NP . Lyda Jester, PA . Audry Riles, PharmD   Please be sure to bring in all your medications bottles to every appointment.

## 2019-05-18 NOTE — Progress Notes (Signed)
Heart Failure Note   Date:  05/18/2019   ID:  [name]., DOB 1960-11-10, MRN [id number]  Location: Home  Provider location: [address] [place] Type of Visit:  Established patient  PCP:  [name], MD  Cardiologist:  [name] Primary HF: [name]  Chief Complaint: Shortness of breath   History of Present Illness: [name]. is a 59 y.o. male referred by [name] for evaluation of CHF who presents via audio/video conferencing for a  telehealth visit today.     Today,  he denies symptoms of cough, fevers, chills, or new SOB worrisome for COVID 19.    Patient has history of CAD s/p CABG 2005, chronic atrial fibrillation on Eliquis, ESRD s/p renal transplant 2012, anc chronic diastolic CHF.  His renal function has been fairly stable, most recent creatinine 1.43.  He has been struggling with volume overload/CHF.  He has a history of ischemic cardiomyopathy with EF as low as 35-40% in the past but last echo in 3/20 showed EF 60-65%.  There is evidence for RV failure with pulmonary hypertension by echo.    Admitted 11/05/18 low grade fever and shortness of breath. HS Trop negative. Covid 19 negative. Blood CX negative. Placed on antibiotics and discharged to home the next day.   Admitted 10/26/64 with A/C Diastolic HF. Diuresed with IV lasix and transitioned to torsemide 80/40 mg . Discharge weight 252 pounds.   Today he returns for HF follow up. Overall feeling fine. Occasionally dizzy.  Denies SOB/PND/Orthopnea. Using BiPap every night. No chest pain.  Appetite ok. No fever or chills. Weight at home 253 pounds. Walking 1500 steps. Taking all medications.  Labs (7/19): LDL 49 Labs (2/20): K 3.7, creatinine 1.43 Labs (4/20): K 4.3, creatinine 1.24 Labs (11/06/18): K 3.7 creatinine 1.23  Labs (05/07/19): K 3.8 Creatinine 1.5   PMH: 1. CAD: s/p CABG x 4 in [place] in 2005.  - Cardiolite (1/20): EF 47%, small fixed apical septal defect, no  ischemia.  Low risk study.  2. Asthma 3. Gout 4. Atrial fibrillation: Chronic.  5. ESRD s/p renal transplantation at [facility] in 2012.  6. Anemia of chronic disease 7. Type II diabetes 8. HTN 9. Hyperlipidemia 10. OSA: On bipap.  11. Ischemic cardiomyopathy: EF 35-40% in past.  - Cardiolite (1/20) with EF 47%.  - Echo (1/20): EF 50-55%, mild MR, RV moderately dilated with moderately decreased systolic function, PASP 73 mmHg, moderate TR.   - Echo (3/20): EF 60-65%, PASP 75 mmHg, mildly dilated RV with mildly decreased systolic function.  12. Obesity: s/p gastric bypass in 2015.  13. Bradycardia with beta blocker.   Past Surgical History:  Procedure Laterality Date  . BARIATRIC SURGERY    . CARDIAC CATHETERIZATION     [facility]  . CARDIAC CATHETERIZATION  05/03/2019  . CATARACT EXTRACTION W/PHACO Right 10/29/2017   Procedure: CATARACT EXTRACTION PHACO AND INTRAOCULAR LENS PLACEMENT ([place])  RIGHT DIABETIC;  Surgeon: [name], MD;  Location: [name];  Service: Ophthalmology;  Laterality: Right;  Diabetic - insulin  . CATARACT EXTRACTION W/PHACO Left 11/18/2017   Procedure: CATARACT EXTRACTION PHACO AND INTRAOCULAR LENS PLACEMENT ([place]) LEFT IVA/TOPICAL;  Surgeon: [name], MD;  Location: [place];  Service: Ophthalmology;  Laterality: Left;  DIABETES - insulin sleep apnea  . COLONOSCOPY WITH PROPOFOL N/A 04/22/2013   Procedure: COLONOSCOPY WITH PROPOFOL;  Surgeon: [name], MD;  Location: [facility];  Service:  Endoscopy;  Laterality: N/A;  . CORONARY ARTERY BYPASS GRAFT  2005   X 4  . DG ANGIO AV SHUNT*L*     right and left upper arms  . FASCIOTOMY  03/03/2012   Procedure: FASCIOTOMY;  Surgeon: [name], MD;  Location: [place];  Service: Orthopedics;  Laterality: Right;  FASCIOTOMY RIGHT SMALL FINGER  . FASCIOTOMY Left 08/17/2013   Procedure: FASCIOTOMY LEFT RING;  Surgeon: [name], MD;  Location: [place];  Service: Orthopedics;  Laterality: Left;  . INCISION AND DRAINAGE ABSCESS Left 10/15/2015   Procedure: INCISION AND DRAINAGE ABSCESS;  Surgeon: [name], MD;  Location: [facility];  Service: General;  Laterality: Left;  . KIDNEY TRANSPLANT  09/13/2010   cadaver--at [facility]  . RIGHT HEART CATH N/A 11/15/2016   Procedure: RIGHT HEART CATH;  Surgeon: [name], MD;  Location: [place] [facility];  Service: Cardiovascular;  Laterality: N/A;  . RIGHT HEART CATH N/A 05/03/2019   Procedure: RIGHT HEART CATH;  Surgeon: [name], MD;  Location: [place] [facility];  Service: Cardiovascular;  Laterality: N/A;  . TYMPANIC MEMBRANE REPAIR  1/12   left  . VASECTOMY       Current Outpatient Medications  Medication Sig Dispense Refill  . albuterol (PROVENTIL HFA;VENTOLIN HFA) 108 (90 Base) MCG/ACT inhaler Inhale 2 puffs every 6 (six) hours as needed into the lungs for wheezing or shortness of breath. 1 Inhaler 2  . amLODipine (NORVASC) 10 MG tablet Take 1 tablet (10 mg total) by mouth daily. 30 tablet 6  . apixaban (ELIQUIS) 5 MG TABS tablet Take 1 tablet (5 mg total) by mouth 2 (two) times daily. 180 tablet 1  . beclomethasone (QVAR REDIHALER) 80 MCG/ACT inhaler Inhale 2 puffs into the lungs 2 (two) times daily. Rinse mouth after use. (Patient taking differently: Inhale 2 puffs into the lungs daily as needed (allergies). Rinse mouth after use.) 1 Inhaler 2  . buPROPion (WELLBUTRIN SR) 150 MG 12 hr tablet TAKE 1 TABLET BY MOUTH TWICE DAILY (Patient taking differently: Take 150 mg by mouth 2 (two) times daily. ) 180 tablet 3  . colchicine 0.6 MG tablet Take 1 tablet (0.6 mg total) by mouth daily. 60 tablet 11  . dapagliflozin propanediol (FARXIGA) 10 MG TABS tablet Take 10 mg by mouth daily before breakfast. 30 tablet 6  . ferrous sulfate 325 (65 FE) MG EC tablet Take 650 mg by mouth 2 (two) times daily.     . fluticasone (FLONASE) 50 MCG/ACT nasal spray PLACE 2 SPRAYS INTO  BOTH NOSTRILS DAILY. (Patient taking differently: Place into both nostrils daily. ) 16 g 0  . gabapentin (NEURONTIN) 300 MG capsule Take 300 mg by mouth at bedtime.     Marland Kitchen HUMALOG KWIKPEN 100 UNIT/ML KwikPen Inject 5-10 Units into the skin 3 (three) times daily. Sliding scale    . insulin glargine (LANTUS) 100 unit/mL SOPN Inject 0.1 mLs (10 Units total) into the skin at bedtime. Currently using 15 units (Patient taking differently: Inject 28 Units into the skin at bedtime. ) 15 mL 11  . lisinopril (ZESTRIL) 40 MG tablet Take 0.5 tablets (20 mg total) by mouth daily. 90 tablet 3  . metolazone (ZAROXOLYN) 2.5 MG tablet Take 2.5 mg by mouth daily as needed (fluid retention).     . montelukast (SINGULAIR) 10 MG tablet TAKE 1 TABLET BY MOUTH AT BEDTIME (Patient taking differently: Take 10 mg by mouth at bedtime. )  30 tablet 0  . Multiple Vitamin (MULTIVITAMIN WITH MINERALS) TABS tablet Take 1 tablet by mouth daily.    . mycophenolate (MYFORTIC) 180 MG EC tablet Take 360 mg by mouth 2 (two) times daily.     Marland Kitchen omeprazole (PRILOSEC) 20 MG capsule TAKE 1 CAPSULE BY MOUTH ONCE DAILY (Patient taking differently: Take 20 mg by mouth daily. ) 90 capsule 3  . oxyCODONE (ROXICODONE) 5 MG immediate release tablet Take 1 tablet (5 mg total) by mouth every 8 (eight) hours as needed. 20 tablet 0  . potassium chloride (MICRO-K) 10 MEQ CR capsule Take 4 capsules (40 mEq total) by mouth every morning AND 2 capsules (20 mEq total) every evening. 360 capsule 4  . rosuvastatin (CRESTOR) 10 MG tablet TAKE 1 TABLET BY MOUTH DAILY (Patient taking differently: Take 10 mg by mouth daily. ) 90 tablet 3  . spironolactone (ALDACTONE) 25 MG tablet Take 1 tablet (25 mg total) by mouth daily. 30 tablet 11  . tacrolimus (PROGRAF) 1 MG capsule Take 1 mg by mouth 2 (two) times daily.    . tadalafil, PAH, (ADCIRCA) 20 MG tablet Take 2 tablets (40 mg total) by mouth daily. (Patient taking differently: Take 20 mg by mouth daily. ) 60 tablet 6   . tamsulosin (FLOMAX) 0.4 MG CAPS capsule Take 0.4 mg by mouth daily.     Marland Kitchen torsemide (DEMADEX) 20 MG tablet Take torsemide 80 mg in am and 40 mg in pm 210 tablet 3  . traMADol (ULTRAM) 50 MG tablet Take 1 tablet (50 mg total) by mouth 3 (three) times daily as needed. (Patient taking differently: Take 50 mg by mouth 3 (three) times daily as needed for moderate pain. ) 20 tablet 0   No current facility-administered medications for this encounter.    Allergies:   Morphine, Acetaminophen, and Ibuprofen   Social History:  The patient  reports that he quit smoking about 24 years ago. His smoking use included cigars. He has never used smokeless tobacco. He reports current alcohol use. He reports that he does not use drugs.   Family History:  The patient's family history includes Breast cancer in his maternal grandmother; Diabetes in his maternal grandmother; Heart disease in his father; Kidney disease in his father; Kidney failure in his father; Liver cancer in his paternal grandmother and paternal uncle; Valvular heart disease in his mother.   ROS:  Please see the history of present illness.   All other systems are personally reviewed and negative.  Vitals:   05/18/19 0927  BP: (!) 146/82  Pulse: 84  SpO2: 96%   Wt Readings from Last 3 Encounters:  05/18/19 118.2 kg  05/07/19 114.4 kg  05/01/19 120.2 kg    Exam:   General:  Well appearing. No resp difficulty HEENT: normal Neck: supple. no JVD. Carotids 2+ bilat; no bruits. No lymphadenopathy or thryomegaly appreciated. Cor: PMI nondisplaced. Irregular rate & rhythm. No rubs, gallops or murmurs. Lungs: clear Abdomen: soft, nontender, nondistended. No hepatosplenomegaly. No bruits or masses. Good bowel sounds. Extremities: no cyanosis, clubbing, rash, edema Neuro: alert & orientedx3, cranial nerves grossly intact. moves all 4 extremities w/o difficulty. Affect pleasant  Recent Labs: 01/16/2019: Magnesium 1.9 05/03/2019: ALT 10; B  Natriuretic Peptide 109.2; Hemoglobin 9.8; Platelets 146; TSH 1.615 05/07/2019: BUN 68; Creatinine, Ser 1.50; Potassium 3.8; Sodium 142  Personally reviewed   Wt Readings from Last 3 Encounters:  05/18/19 118.2 kg (260 lb 9.6 oz)  05/07/19 114.4 kg (252 lb 3.3 oz)  05/01/19 120.2 kg (265 lb)    ASSESSMENT AND PLAN:  1. Chronic diastolic CHF: With prominent RV failure/pulmonary hypertension. ?If RV failure is related to severe OSA, he was a remote smoker.  Echo in 3/20 with EF 60-65%, mildly dilated RV with mildly decreased systolic function, severe pulmonary hypertension.    Will need to be careful with diuresis given history of renal transplantation.  - NYHA II. Volume status stable. Continue torsemide 80 mg in am and hold evening torsemide.  - Check BMET today. May need to cut back.  Continue current lisinopril and spironolactone.  2. Atrial fibrillation: Chronic. Rate controlled.   He will continue Eliquis. Not on beta blocker with history of bradycardia.   3. CAD: S/p CABG 2005.  Low risk Cardiolite in 1/20.   - No ASA given Eliquis use.  - Continue Crestor 10 mg daily, good lipids in 7/19.  4. CKD: Stage 3.  S/p renal transplantation in 2012. He remains on mycophenolate and tacrolimus.   Check BMET  5. OSA: Continue Bipap every night.  6. HTN: Stable.  7. Pulmonary hypertension: Severe pulmonary hypertension by 3/20 echo.  Given his gender, most likely this is a combination of group 2 (pulmonary venous hypertension) and group 3 (OSA, ?unrecognized OHS).    BMET today. Encouraged to continue increasing steps. Goal 10,000 steps per day.  Follow up in 3 months with Dr Aundra Dubin.  Jeanmarie Hubert, NP  05/18/2019  [facility] [place] [address] [phone] (office) [phone] (fax)

## 2019-05-19 ENCOUNTER — Other Ambulatory Visit: Payer: Self-pay | Admitting: *Deleted

## 2019-05-19 NOTE — Telephone Encounter (Signed)
noted 

## 2019-05-19 NOTE — Patient Outreach (Signed)
[facility]) Care Management  05/19/2019  [name] November 18, 1960 [id number]   Transition of care /Case Closure    Referral received:05/04/19 Initial outreach:05/10/19 Insurance: [facility]   Subjective : Successful follow up call to patient , he reports that he is getting along much better. He reports having a good visit at heart failure clinic.  Patient reports improvement in mobility, using a cane. He states he declined home health services with [facility]. States he was told since he not home bound he would not be eligible for home health services as he went to visit his son in the [place] recently.Discussed having some difficulty with getting in and out of the car.  He reports having a friend install grab bars in bathroom and declines need for bedside commode offered to follow up with office regarding order requested and he declined.   Patient discussed that he has been able to speak with Active health management regarding enrolling in condition management for chronic health disease support and education .   Objective: Mr. [name] hospitalized [facility]IDH6/8-6/16/83 for Acute systolic Heart failureComorbidities include: Chronic diastolic heart failure, hypertension, pulmonary hypertension, Chronic atrial fib, Type 2 Diabetes A1c 8.2 on 05/04/19, s/p kidney transplant 2012, Gout right knee . He was discharged to home on 05/07/19 with orders for home health physical therapy, RN for CHF management no equipment needs    Plan Will close case to [facility] care management services Verified through Active Adv[employer] view that patient is enrolled in Active health management chronic  disease management program.    Joylene Draft, RN, BSN  [place] Management Coordinator  [phone]- Mobile [phone]- [place]

## 2019-05-20 ENCOUNTER — Encounter (HOSPITAL_COMMUNITY): Payer: 59

## 2019-05-21 ENCOUNTER — Telehealth (HOSPITAL_COMMUNITY): Payer: Self-pay

## 2019-05-21 ENCOUNTER — Encounter (HOSPITAL_COMMUNITY): Payer: Self-pay

## 2019-05-21 DIAGNOSIS — I5043 Acute on chronic combined systolic (congestive) and diastolic (congestive) heart failure: Secondary | ICD-10-CM | POA: Insufficient documentation

## 2019-05-21 NOTE — Telephone Encounter (Signed)
FMLA forms filled out for wife and faxed to [employer] with confirmation received. Original to be scanned into chart.

## 2019-05-25 ENCOUNTER — Other Ambulatory Visit: Payer: Self-pay | Admitting: Internal Medicine

## 2019-05-25 DIAGNOSIS — J309 Allergic rhinitis, unspecified: Secondary | ICD-10-CM

## 2019-05-26 DIAGNOSIS — E662 Morbid (severe) obesity with alveolar hypoventilation: Secondary | ICD-10-CM | POA: Diagnosis not present

## 2019-05-26 DIAGNOSIS — G4733 Obstructive sleep apnea (adult) (pediatric): Secondary | ICD-10-CM | POA: Diagnosis not present

## 2019-06-10 ENCOUNTER — Encounter: Payer: Self-pay | Admitting: Cardiovascular Disease

## 2019-06-10 ENCOUNTER — Ambulatory Visit (INDEPENDENT_AMBULATORY_CARE_PROVIDER_SITE_OTHER): Payer: 59 | Admitting: Cardiovascular Disease

## 2019-06-10 ENCOUNTER — Other Ambulatory Visit: Payer: Self-pay

## 2019-06-10 VITALS — BP 132/62 | HR 82 | Ht 70.5 in | Wt 262.0 lb

## 2019-06-10 DIAGNOSIS — I482 Chronic atrial fibrillation, unspecified: Secondary | ICD-10-CM | POA: Diagnosis not present

## 2019-06-10 DIAGNOSIS — I2510 Atherosclerotic heart disease of native coronary artery without angina pectoris: Secondary | ICD-10-CM | POA: Diagnosis not present

## 2019-06-10 DIAGNOSIS — I5042 Chronic combined systolic (congestive) and diastolic (congestive) heart failure: Secondary | ICD-10-CM | POA: Diagnosis not present

## 2019-06-10 DIAGNOSIS — Z862 Personal history of diseases of the blood and blood-forming organs and certain disorders involving the immune mechanism: Secondary | ICD-10-CM | POA: Diagnosis not present

## 2019-06-10 DIAGNOSIS — I10 Essential (primary) hypertension: Secondary | ICD-10-CM

## 2019-06-10 DIAGNOSIS — I252 Old myocardial infarction: Secondary | ICD-10-CM | POA: Diagnosis not present

## 2019-06-10 MED ORDER — AMLODIPINE BESYLATE 5 MG PO TABS: 5.0000 mg | ORAL_TABLET | Freq: Every day | ORAL | 2 refills | Status: DC

## 2019-06-10 NOTE — Progress Notes (Signed)
Cardiology Office Note   Date:  06/10/2019   ID:  [name]., DOB 1960/10/01, MRN [id number]  PCP:  Carl Carbon, MD  Cardiologist:   Carl Sacramento, MD   Chief Complaint  Patient presents with  . OTHER    2 month f/u c/o low BP and dizziness. Meds reviewed verbally with pt.      History of Present Illness: [name]. is a 59 y.o. male   who is here today regarding complex cardiac issues.  He hashistory of CAD status post CABG in 2005, chronic combined systolic and diastolic CHF secondary to ICM with prior EF of 35 to 40%now improved to 50 to 55% by echo in 03/2018, chronic A. fib on Eliquis,pulmonary hypertension, ESRD previously on hemodialysis from 2004 through 2012 status post kidney transplantin 2012, anemia of chronic disease, DM2, HTN, HLD, beta-blocker induced bradycardia, obesity status post gastric bypass surgery in 11/2013, and sleep apneaon BiPAP.  Stress test on 04/10/2018 showed no significant ischemia, small region of fixed perfusion defect in the inferoapical and apical septal region possibly attenuation artifact, EF 47%, low risk study.   He had episodes of intermittent episodes of extreme dizziness with heart rate in the high 40s.  No syncope.  He had a ZIO Patch monitor done which showed no significant bradycardic events except during sleep time when his heart rate was in 30s.  Overall, there was no indication for a pacemaker.  He is followed by the advanced heart failure clinic.  He was most recently hospitalized in February with heart failure.  Echo showed an EF of 45 to 50% with moderate pulmonary hypertension.  Right heart catheterization done by [name] showed severely elevated right and left heart filling pressures with severe pulmonary hypertension likely venous hypertension.  He was aggressively diuresed and transitioned to oral torsemide upon discharge.  Discharge weight was 252 pounds.  He was discharged on torsemide 80 mg  in the morning and 40 mg in the evening.  However, he noted intermittent hypotension at home and thus he decrease the dose to 40 mg twice daily.  His weight has been maintaining between 250 to 253 pounds.  He feels much better than before with significant improvement in shortness of breath.  He does complain of orthostatic dizziness and occasional episodes of hypotension.  He uses BiPAP regularly for sleep apnea.     Past Medical History:  Diagnosis Date  . Allergy    seasonal  . Amnestic MCI (mild cognitive impairment with memory loss)   . Asthma   . Benign neoplasm of colon   . CAD (coronary artery disease) 2005   a.  Status post four-vessel CABG in 2005; b. 03/2018 MV: Small, fixed inferoapical and apical septal defect. No ischemia. EF 47% (60-65% by 05/2018 Echo).  . Chronic atrial fibrillation ([place])    a. CHADS2VASc 4 (CHF, HTN, DM, vascular disease)--> Eliquis; b. 09/2018 Zio: Chronic Afib.  . Chronic combined systolic and diastolic CHF (congestive heart failure) ([place])    a.  7/18 Echo: EF 45-50%; b. 05/2018 Echo: EF 60-65%, RVSP 74.8; c. 12/2018 Echo: EF 50-55%. Sev dil LA.  Marland Kitchen Chronic venous stasis dermatitis of both lower extremities   . Cytomegaloviral disease ([place]) 2017  . Depression   . Diabetes mellitus   . Diverticulosis   . Dyspnea   . Erectile dysfunction   . ESRD (end stage renal disease) ([place]) 2005   a. HD 2004-2012; b. S/p renal transplant  in 2012  . GERD (gastroesophageal reflux disease)   . Gout   . Hyperlipidemia    low since renal failure  . Hypertension   . Kidney transplant status, cadaveric 2012  . Obesity   . PAH (pulmonary artery hypertension) ([place])    a. 05/2018 Echo: RVSP 74.47mHg; b. 12/2018 RV not well visualized.  . Pneumonia   . Purpura ([place]   . Sleep apnea    bipap with oxygen 2 l  . Trigger finger   . Ulcer 05/2016   Left shin  . Wears glasses     Past Surgical History:  Procedure Laterality Date  . BARIATRIC SURGERY    . CARDIAC  CATHETERIZATION     [facility]  . CARDIAC CATHETERIZATION  05/03/2019  . CATARACT EXTRACTION W/PHACO Right 10/29/2017   Procedure: CATARACT EXTRACTION PHACO AND INTRAOCULAR LENS PLACEMENT ([place]  RIGHT DIABETIC;  Surgeon: BLeandrew Koyanagi MD;  [facility]  Service: Ophthalmology;  Laterality: Right;  Diabetic - insulin  . CATARACT EXTRACTION W/PHACO Left 11/18/2017   Procedure: CATARACT EXTRACTION PHACO AND INTRAOCULAR LENS PLACEMENT ([place] IVA/TOPICAL;  Surgeon: BLeandrew Koyanagi MD;  [facility]  Service: Ophthalmology;  Laterality: Left;  DIABETES - insulin sleep apnea  . COLONOSCOPY WITH PROPOFOL N/A 04/22/2013   Procedure: COLONOSCOPY WITH PROPOFOL;  Surgeon: DMilus Banister MD;  [facility];  Service: Endoscopy;  Laterality: N/A;  . CORONARY ARTERY BYPASS GRAFT  2005   X 4  . DG ANGIO AV SHUNT*L*     right and left upper arms  . FASCIOTOMY  03/03/2012   Procedure: FASCIOTOMY;  Surgeon: GWynonia Sours MD;  [facility]  Service: Orthopedics;  Laterality: Right;  FASCIOTOMY RIGHT SMALL FINGER  . FASCIOTOMY Left 08/17/2013   Procedure: FASCIOTOMY LEFT RING;  Surgeon: GWynonia Sours MD;  [facility]  Service: Orthopedics;  Laterality: Left;  . INCISION AND DRAINAGE ABSCESS Left 10/15/2015   Procedure: INCISION AND DRAINAGE ABSCESS;  Surgeon: DJules Husbands MD;  Location: [facility];  Service: General;  Laterality: Left;  . KIDNEY TRANSPLANT  09/13/2010   cadaver--at [facility]  . RIGHT HEART CATH N/A 11/15/2016   Procedure: RIGHT HEART CATH;  Surgeon: AWellington Hampshire MD;  [facility];  Service: Cardiovascular;  Laterality: N/A;  . RIGHT HEART CATH N/A 05/03/2019   Procedure: RIGHT HEART CATH;  Surgeon: MLarey Dresser MD;  [facility];  Service: Cardiovascular;  Laterality: N/A;  . TYMPANIC MEMBRANE REPAIR  1/12   left  . VASECTOMY       Current Outpatient  Medications  Medication Sig Dispense Refill  . albuterol (PROVENTIL HFA;VENTOLIN HFA) 108 (90 Base) MCG/ACT inhaler Inhale 2 puffs every 6 (six) hours as needed into the lungs for wheezing or shortness of breath. 1 Inhaler 2  . amLODipine (NORVASC) 10 MG tablet Take 1 tablet (10 mg total) by mouth daily. 30 tablet 6  . apixaban (ELIQUIS) 5 MG TABS tablet Take 1 tablet (5 mg total) by mouth 2 (two) times daily. 180 tablet 1  . beclomethasone (QVAR REDIHALER) 80 MCG/ACT inhaler Inhale 2 puffs into the lungs 2 (two) times daily. Rinse mouth after use. (Patient taking differently: Inhale 2 puffs into the lungs daily as needed (allergies). Rinse mouth after use.) 1 Inhaler 2  . buPROPion (WELLBUTRIN SR) 150 MG 12 hr tablet TAKE 1 TABLET BY MOUTH TWICE DAILY (Patient taking differently: Take 150 mg  by mouth 2 (two) times daily. ) 180 tablet 3  . colchicine 0.6 MG tablet Take 1 tablet (0.6 mg total) by mouth daily. 60 tablet 11  . dapagliflozin propanediol (FARXIGA) 10 MG TABS tablet Take 10 mg by mouth daily before breakfast. 30 tablet 6  . ferrous sulfate 325 (65 FE) MG EC tablet Take 650 mg by mouth 2 (two) times daily.     . fluticasone (FLONASE) 50 MCG/ACT nasal spray PLACE 2 SPRAYS INTO BOTH NOSTRILS DAILY. 16 g 11  . gabapentin (NEURONTIN) 300 MG capsule Take 300 mg by mouth at bedtime.     Marland Kitchen HUMALOG KWIKPEN 100 UNIT/ML KwikPen Inject 5-10 Units into the skin 3 (three) times daily. Sliding scale    . insulin glargine (LANTUS) 100 unit/mL SOPN Inject 0.1 mLs (10 Units total) into the skin at bedtime. Currently using 15 units (Patient taking differently: Inject 28 Units into the skin at bedtime. ) 15 mL 11  . lisinopril (ZESTRIL) 40 MG tablet Take 0.5 tablets (20 mg total) by mouth daily. 90 tablet 3  . metolazone (ZAROXOLYN) 2.5 MG tablet Take 2.5 mg by mouth daily as needed (fluid retention).     . montelukast (SINGULAIR) 10 MG tablet TAKE 1 TABLET BY MOUTH AT BEDTIME (Patient taking differently:  Take 10 mg by mouth at bedtime. ) 30 tablet 0  . Multiple Vitamin (MULTIVITAMIN WITH MINERALS) TABS tablet Take 1 tablet by mouth daily.    . mycophenolate (MYFORTIC) 180 MG EC tablet Take 360 mg by mouth 2 (two) times daily.     Marland Kitchen omeprazole (PRILOSEC) 20 MG capsule TAKE 1 CAPSULE BY MOUTH ONCE DAILY (Patient taking differently: Take 20 mg by mouth daily. ) 90 capsule 3  . potassium chloride (MICRO-K) 10 MEQ CR capsule Take 4 capsules (40 mEq total) by mouth every morning AND 2 capsules (20 mEq total) every evening. 360 capsule 4  . rosuvastatin (CRESTOR) 10 MG tablet TAKE 1 TABLET BY MOUTH DAILY (Patient taking differently: Take 10 mg by mouth daily. ) 90 tablet 3  . tacrolimus (PROGRAF) 1 MG capsule Take 1 mg by mouth 2 (two) times daily.    . tadalafil, PAH, (ADCIRCA) 20 MG tablet Take 2 tablets (40 mg total) by mouth daily. (Patient taking differently: Take 20 mg by mouth daily. ) 60 tablet 6  . tamsulosin (FLOMAX) 0.4 MG CAPS capsule Take 0.4 mg by mouth daily.     Marland Kitchen torsemide (DEMADEX) 20 MG tablet Take torsemide 80 mg in am and 40 mg in pm (Patient taking differently: Take torsemide 40 mg in am and 40 mg in pm) 210 tablet 3  . traMADol (ULTRAM) 50 MG tablet Take 1 tablet (50 mg total) by mouth 3 (three) times daily as needed. (Patient taking differently: Take 50 mg by mouth 3 (three) times daily as needed for moderate pain. ) 20 tablet 0   No current facility-administered medications for this visit.    Allergies:   Morphine, Acetaminophen, and Ibuprofen    Social History:  The patient  reports that he quit smoking about 24 years ago. His smoking use included cigars. He has never used smokeless tobacco. He reports current alcohol use. He reports that he does not use drugs.   Family History:  The patient's family history includes Breast cancer in his maternal grandmother; Diabetes in his maternal grandmother; Heart disease in his father; Kidney disease in his father; Kidney failure in his  father; Liver cancer in his paternal grandmother and paternal  uncle; Valvular heart disease in his mother.    ROS:  Please see the history of present illness.   Otherwise, review of systems are positive for none.   All other systems are reviewed and negative.    PHYSICAL EXAM: VS:  BP 132/62 (BP Location: Left Arm, Patient Position: Sitting, Cuff Size: Large)   Pulse 82   Ht 5' 10.5" (1.791 m)   Wt 262 lb (118.8 kg)   SpO2 97%   BMI 37.06 kg/m  , BMI Body mass index is 37.06 kg/m. GEN: Well nourished, well developed, in no acute distress  HEENT: normal  Neck: No JVD,  no carotid bruits, or masses Cardiac: Irregularly irregular; no  rubs, or gallops, trace leg  edema . 2/6 systolic ejection murmur in the aortic area. Respiratory:  clear to auscultation bilaterally, normal work of breathing GI: soft, nontender, , + BS.  Nondistended. MS: no deformity or atrophy  Skin: warm and dry, no rash Neuro:  Strength and sensation are intact Psych: euthymic mood, full affect   EKG:  EKG is ordered today. The ekg ordered today demonstrates atrial fibrillation with nonspecific IVCD.  Ventricular rate is 82 bpm.   Recent Labs: 01/16/2019: Magnesium 1.9 05/03/2019: ALT 10; B Natriuretic Peptide 109.2; Hemoglobin 9.8; Platelets 146; TSH 1.615 05/18/2019: BUN 49; Creatinine, Ser 1.74; Potassium 4.5; Sodium 140    Lipid Panel    Component Value Date/Time   CHOL 89 10/09/2017 0000   CHOL 161 03/07/2015 0823   TRIG 73 03/06/2016 0736   HDL 34 (L) 03/06/2016 0736   HDL 38 (L) 03/07/2015 0823   CHOLHDL 2.7 03/06/2016 0736   VLDL 15 03/06/2016 0736   LDLCALC 49 10/09/2017 0000   LDLCALC 105 (H) 03/07/2015 0823   LDLDIRECT 41.9 04/20/2012 1618      Wt Readings from Last 3 Encounters:  06/10/19 262 lb (118.8 kg)  05/18/19 260 lb 9.6 oz (118.2 kg)  05/07/19 252 lb 3.3 oz (114.4 kg)       No flowsheet data found.    ASSESSMENT AND PLAN:  1.  Chronic systolic/diastolic heart  failure: He is feeling significantly better after recent diuresis and he seems to be euvolemic on current dose of torsemide 40 mg twice daily.  I requested basic metabolic profile.  Red vest reading today is 28.  He is no longer on a beta-blocker due to previous bradycardia.  2. Chronic atrial fibrillation: Rate is controlled without medications. He is tolerating anticoagulation with Eliquis.    3. Coronary artery disease involving native coronary arteries without angina: Continue medical therapy.   4. Hyperlipidemia: Continue treatment with rosuvastatin 10 mg daily. Most recent LDL was 43.   5.  Status post kidney transplant: Gradual decline in renal function with most recent creatinine of 1.74.  We will have to be cautious with diuresis.  6.  Essential hypertension: The patient is having orthostatic dizziness and intermittent hypotension which I think is likely related to his volume status being optimal compared to before.  I decreased amlodipine to 5 mg daily.   Disposition: Continue follow-up with the heart failure clinic and follow-up with me in 6 months.  Signed,  Carl Sacramento, MD  06/10/2019 9:59 AM    [place]

## 2019-06-10 NOTE — Patient Instructions (Addendum)
Medication Instructions:  Your physician has recommended you make the following change in your medication:   REDUCE Amlodipine to 5 mg daily. An Rx has been sent to your pharmacy.  *If you need a refill on your cardiac medications before your next appointment, please call your pharmacy*   Lab Work: Bmet, Cbc, Iron panel today  If you have labs (blood work) drawn today and your tests are completely normal, you will receive your results only by: Marland Kitchen MyChart Message (if you have MyChart) OR . A paper copy in the mail If you have any lab test that is abnormal or we need to change your treatment, we will call you to review the results.   Testing/Procedures: None ordered   Follow-Up: At [facility], you and your health needs are our priority.  As part of our continuing mission to provide you with exceptional heart care, we have created designated Provider Care Teams.  These Care Teams include your primary Cardiologist (physician) and Advanced Practice Providers (APPs -  Physician Assistants and Nurse Practitioners) who all work together to provide you with the care you need, when you need it.  We recommend signing up for the patient portal called "MyChart".  Sign up information is provided on this After Visit Summary.  MyChart is used to connect with patients for Virtual Visits (Telemedicine).  Patients are able to view lab/test results, encounter notes, upcoming appointments, etc.  Non-urgent messages can be sent to your provider as well.   To learn more about what you can do with MyChart, go to [email or web address].    Your next appointment:   6 month(s)  The format for your next appointment:   In Person  Provider:    You may see [name], MD or one of the following Advanced Practice Providers on your designated Care Team:    Murray Hodgkins, NP  Christell Faith, PA-C  Marrianne Mood, PA-C    Other Instructions N/A

## 2019-06-11 ENCOUNTER — Ambulatory Visit (INDEPENDENT_AMBULATORY_CARE_PROVIDER_SITE_OTHER): Payer: 59 | Admitting: Internal Medicine

## 2019-06-11 ENCOUNTER — Encounter: Payer: Self-pay | Admitting: Internal Medicine

## 2019-06-11 VITALS — BP 98/56 | HR 64 | Temp 97.6°F | Ht 71.0 in | Wt 259.0 lb

## 2019-06-11 DIAGNOSIS — N186 End stage renal disease: Secondary | ICD-10-CM | POA: Diagnosis not present

## 2019-06-11 DIAGNOSIS — M659 Synovitis and tenosynovitis, unspecified: Secondary | ICD-10-CM | POA: Diagnosis not present

## 2019-06-11 DIAGNOSIS — M65949 Unspecified synovitis and tenosynovitis, unspecified hand: Secondary | ICD-10-CM

## 2019-06-11 DIAGNOSIS — I252 Old myocardial infarction: Secondary | ICD-10-CM | POA: Diagnosis not present

## 2019-06-11 DIAGNOSIS — I2510 Atherosclerotic heart disease of native coronary artery without angina pectoris: Secondary | ICD-10-CM

## 2019-06-11 DIAGNOSIS — I482 Chronic atrial fibrillation, unspecified: Secondary | ICD-10-CM | POA: Diagnosis not present

## 2019-06-11 DIAGNOSIS — I5042 Chronic combined systolic (congestive) and diastolic (congestive) heart failure: Secondary | ICD-10-CM

## 2019-06-11 HISTORY — DX: Synovitis and tenosynovitis, unspecified: M65.9

## 2019-06-11 HISTORY — DX: Unspecified synovitis and tenosynovitis, unspecified hand: M65.949

## 2019-06-11 LAB — CBC WITH DIFFERENTIAL/PLATELET
Basophils Absolute: 0 10*3/uL (ref 0.0–0.2)
Basos: 1 %
EOS (ABSOLUTE): 0 10*3/uL (ref 0.0–0.4)
Eos: 1 %
Hematocrit: 41.6 % (ref 37.5–51.0)
Hemoglobin: 13 g/dL (ref 13.0–17.7)
Immature Grans (Abs): 0 10*3/uL (ref 0.0–0.1)
Immature Granulocytes: 0 %
Lymphocytes Absolute: 1.4 10*3/uL (ref 0.7–3.1)
Lymphs: 21 %
MCH: 24.4 pg — ABNORMAL LOW (ref 26.6–33.0)
MCHC: 31.3 g/dL — ABNORMAL LOW (ref 31.5–35.7)
MCV: 78 fL — ABNORMAL LOW (ref 79–97)
Monocytes Absolute: 0.4 10*3/uL (ref 0.1–0.9)
Monocytes: 7 %
Neutrophils Absolute: 4.6 10*3/uL (ref 1.4–7.0)
Neutrophils: 70 %
Platelets: 154 10*3/uL (ref 150–450)
RBC: 5.33 x10E6/uL (ref 4.14–5.80)
RDW: 21.8 % — ABNORMAL HIGH (ref 11.6–15.4)
WBC: 6.5 10*3/uL (ref 3.4–10.8)

## 2019-06-11 LAB — BASIC METABOLIC PANEL
BUN/Creatinine Ratio: 28 — ABNORMAL HIGH (ref 9–20)
BUN: 48 mg/dL — ABNORMAL HIGH (ref 6–24)
CO2: 25 mmol/L (ref 20–29)
Calcium: 9.5 mg/dL (ref 8.7–10.2)
Chloride: 96 mmol/L (ref 96–106)
Creatinine, Ser: 1.7 mg/dL — ABNORMAL HIGH (ref 0.76–1.27)
GFR calc Af Amer: 50 mL/min/{1.73_m2} — ABNORMAL LOW (ref >59)
GFR calc non Af Amer: 43 mL/min/{1.73_m2} — ABNORMAL LOW (ref >59)
Glucose: 289 mg/dL — ABNORMAL HIGH (ref 65–99)
Potassium: 4.1 mmol/L (ref 3.5–5.2)
Sodium: 137 mmol/L (ref 134–144)

## 2019-06-11 LAB — IRON,TIBC AND FERRITIN PANEL
Ferritin: 66 ng/mL (ref 30–400)
Iron Saturation: 19 % (ref 15–55)
Iron: 63 ug/dL (ref 38–169)
Total Iron Binding Capacity: 330 ug/dL (ref 250–450)
UIBC: 267 ug/dL (ref 111–343)

## 2019-06-11 LAB — SEDIMENTATION RATE: Sed Rate: 93 mm/hr — ABNORMAL HIGH (ref 0–20)

## 2019-06-11 NOTE — Assessment & Plan Note (Signed)
Fortunately renal function stable despite more effective diuresis

## 2019-06-11 NOTE — Assessment & Plan Note (Signed)
Compensated now Near lowest weight Discussed that he should accept mild dizziness and low BP

## 2019-06-11 NOTE — Assessment & Plan Note (Signed)
Is on the eliquis Rare symptoms---not clear exacerbations

## 2019-06-11 NOTE — Assessment & Plan Note (Addendum)
Symmetric polyarthritis in PIP Suggestive of RA Does have sister with RA (and brother) Will set up with rheum if serology is positive ([facility] or [place])

## 2019-06-11 NOTE — Progress Notes (Signed)
Subjective:    Patient ID: Carl Philippe Gang., male    DOB: 02/23/1961, 59 y.o.   MRN: [id number]  HPI Here for hospital follow up This visit occurred during the SARS-CoV-2 public health emergency.  Safety protocols were in place, including screening questions prior to the visit, additional usage of staff PPE, and extensive cleaning of exam room while observing appropriate contact time as indicated for disinfecting solutions.   Things are looking good BP is running low---amlodipine was decreased to 51m Still some dizziness this morning--just walking around Did check BP then--92/60 Took 5-10 minutes of holding on till it went away  Weighing daily Close to lowest weight now---then notices the dizziness is more noticeable Slightly down now from his discharge weight  No chest pain Gets occasional palpitations--feels fast/irregular. Also more when weight is down Edema is gone  Now having more pain in fingers Hard to even hold something in hands  Current Outpatient Medications on File Prior to Visit  Medication Sig Dispense Refill  . albuterol (PROVENTIL HFA;VENTOLIN HFA) 108 (90 Base) MCG/ACT inhaler Inhale 2 puffs every 6 (six) hours as needed into the lungs for wheezing or shortness of breath. 1 Inhaler 2  . amLODipine (NORVASC) 5 MG tablet Take 1 tablet (5 mg total) by mouth daily. 90 tablet 2  . apixaban (ELIQUIS) 5 MG TABS tablet Take 1 tablet (5 mg total) by mouth 2 (two) times daily. 180 tablet 1  . beclomethasone (QVAR REDIHALER) 80 MCG/ACT inhaler Inhale 2 puffs into the lungs 2 (two) times daily. Rinse mouth after use. (Patient taking differently: Inhale 2 puffs into the lungs daily as needed (allergies). Rinse mouth after use.) 1 Inhaler 2  . buPROPion (WELLBUTRIN SR) 150 MG 12 hr tablet TAKE 1 TABLET BY MOUTH TWICE DAILY (Patient taking differently: Take 150 mg by mouth 2 (two) times daily. ) 180 tablet 3  . colchicine 0.6 MG tablet Take 1 tablet (0.6 mg total) by mouth  daily. 60 tablet 11  . dapagliflozin propanediol (FARXIGA) 10 MG TABS tablet Take 10 mg by mouth daily before breakfast. 30 tablet 6  . ferrous sulfate 325 (65 FE) MG EC tablet Take 650 mg by mouth 2 (two) times daily.     . fluticasone (FLONASE) 50 MCG/ACT nasal spray PLACE 2 SPRAYS INTO BOTH NOSTRILS DAILY. 16 g 11  . gabapentin (NEURONTIN) 300 MG capsule Take 300 mg by mouth at bedtime.     .Marland KitchenHUMALOG KWIKPEN 100 UNIT/ML KwikPen Inject 5-10 Units into the skin 3 (three) times daily. Sliding scale    . insulin glargine (LANTUS) 100 unit/mL SOPN Inject 0.1 mLs (10 Units total) into the skin at bedtime. Currently using 15 units (Patient taking differently: Inject 28 Units into the skin at bedtime. ) 15 mL 11  . lisinopril (ZESTRIL) 40 MG tablet Take 0.5 tablets (20 mg total) by mouth daily. 90 tablet 3  . metolazone (ZAROXOLYN) 2.5 MG tablet Take 2.5 mg by mouth daily as needed (fluid retention).     . montelukast (SINGULAIR) 10 MG tablet TAKE 1 TABLET BY MOUTH AT BEDTIME (Patient taking differently: Take 10 mg by mouth at bedtime. ) 30 tablet 0  . Multiple Vitamin (MULTIVITAMIN WITH MINERALS) TABS tablet Take 1 tablet by mouth daily.    . mycophenolate (MYFORTIC) 180 MG EC tablet Take 360 mg by mouth 2 (two) times daily.     .Marland Kitchenomeprazole (PRILOSEC) 20 MG capsule TAKE 1 CAPSULE BY MOUTH ONCE DAILY (Patient taking differently: Take  20 mg by mouth daily. ) 90 capsule 3  . potassium chloride (MICRO-K) 10 MEQ CR capsule Take 4 capsules (40 mEq total) by mouth every morning AND 2 capsules (20 mEq total) every evening. 360 capsule 4  . rosuvastatin (CRESTOR) 10 MG tablet TAKE 1 TABLET BY MOUTH DAILY (Patient taking differently: Take 10 mg by mouth daily. ) 90 tablet 3  . tacrolimus (PROGRAF) 1 MG capsule Take 1 mg by mouth 2 (two) times daily.    . tadalafil, PAH, (ADCIRCA) 20 MG tablet Take 2 tablets (40 mg total) by mouth daily. (Patient taking differently: Take 20 mg by mouth daily. ) 60 tablet 6  .  tamsulosin (FLOMAX) 0.4 MG CAPS capsule Take 0.4 mg by mouth daily.     Marland Kitchen torsemide (DEMADEX) 20 MG tablet Take torsemide 80 mg in am and 40 mg in pm (Patient taking differently: Take torsemide 40 mg in am and 40 mg in pm) 210 tablet 3  . traMADol (ULTRAM) 50 MG tablet Take 1 tablet (50 mg total) by mouth 3 (three) times daily as needed. (Patient taking differently: Take 50 mg by mouth 3 (three) times daily as needed for moderate pain. ) 20 tablet 0   No current facility-administered medications on file prior to visit.    Allergies  Allergen Reactions  . Morphine Itching  . Acetaminophen Other (See Comments)    BC of transplant  . Ibuprofen Other (See Comments)    Due to kidney transplant    Past Medical History:  Diagnosis Date  . Allergy    seasonal  . Amnestic MCI (mild cognitive impairment with memory loss)   . Asthma   . Benign neoplasm of colon   . CAD (coronary artery disease) 2005   a.  Status post four-vessel CABG in 2005; b. 03/2018 MV: Small, fixed inferoapical and apical septal defect. No ischemia. EF 47% (60-65% by 05/2018 Echo).  . Chronic atrial fibrillation ([place])    a. CHADS2VASc 4 (CHF, HTN, DM, vascular disease)--> Eliquis; b. 09/2018 Zio: Chronic Afib.  . Chronic combined systolic and diastolic CHF (congestive heart failure) ([place])    a.  7/18 Echo: EF 45-50%; b. 05/2018 Echo: EF 60-65%, RVSP 74.8; c. 12/2018 Echo: EF 50-55%. Sev dil LA.  Marland Kitchen Chronic venous stasis dermatitis of both lower extremities   . Cytomegaloviral disease ([place]) 2017  . Depression   . Diabetes mellitus   . Diverticulosis   . Dyspnea   . Erectile dysfunction   . ESRD (end stage renal disease) ([place]) 2005   a. HD 2004-2012; b. S/p renal transplant in 2012  . GERD (gastroesophageal reflux disease)   . Gout   . Hyperlipidemia    low since renal failure  . Hypertension   . Kidney transplant status, cadaveric 2012  . Obesity   . PAH (pulmonary artery hypertension) ([place])    a. 05/2018 Echo: RVSP  74.21mHg; b. 12/2018 RV not well visualized.  . Pneumonia   . [place]   . Sleep apnea    bipap with oxygen 2 l  . Trigger finger   . Ulcer 05/2016   Left shin  . Wears glasses     Past Surgical History:  Procedure Laterality Date  . BARIATRIC SURGERY    . CARDIAC CATHETERIZATION     [facility]  . CARDIAC CATHETERIZATION  05/03/2019  . CATARACT EXTRACTION W/PHACO Right 10/29/2017   Procedure: CATARACT EXTRACTION PHACO AND INTRAOCULAR LENS PLACEMENT [place]  [place] DIABETIC;  Surgeon: BLeandrew Koyanagi MD;  Location: [place];  Service: Ophthalmology;  Laterality: Right;  Diabetic - insulin  . CATARACT EXTRACTION W/PHACO Left 11/18/2017   Procedure: CATARACT EXTRACTION PHACO AND INTRAOCULAR LENS PLACEMENT ([place]) LEFT IVA/TOPICAL;  Surgeon: [name], MD;  Location: [place];  Service: Ophthalmology;  Laterality: Left;  DIABETES - insulin sleep apnea  . COLONOSCOPY WITH PROPOFOL N/A 04/22/2013   Procedure: COLONOSCOPY WITH PROPOFOL;  Surgeon: [name], MD;  Location: [facility];  Service: Endoscopy;  Laterality: N/A;  . CORONARY ARTERY BYPASS GRAFT  2005   X 4  . DG ANGIO AV SHUNT*L*     right and left upper arms  . FASCIOTOMY  03/03/2012   Procedure: FASCIOTOMY;  Surgeon: [name], MD;  Location: [place];  Service: Orthopedics;  Laterality: Right;  FASCIOTOMY RIGHT SMALL FINGER  . FASCIOTOMY Left 08/17/2013   Procedure: FASCIOTOMY LEFT RING;  Surgeon: [name], MD;  Location: [place];  Service: Orthopedics;  Laterality: Left;  . INCISION AND DRAINAGE ABSCESS Left 10/15/2015   Procedure: INCISION AND DRAINAGE ABSCESS;  Surgeon: [name], MD;  Location: [facility];  Service: General;  Laterality: Left;  . KIDNEY TRANSPLANT  09/13/2010   cadaver--at [facility]  . RIGHT HEART CATH N/A 11/15/2016   Procedure: RIGHT HEART CATH;  Surgeon: [name], MD;  Location: [place] [facility];  Service:  Cardiovascular;  Laterality: N/A;  . RIGHT HEART CATH N/A 05/03/2019   Procedure: RIGHT HEART CATH;  Surgeon: [name], MD;  Location: [place] [facility];  Service: Cardiovascular;  Laterality: N/A;  . TYMPANIC MEMBRANE REPAIR  1/12   left  . VASECTOMY      Family History  Problem Relation Age of Onset  . Heart disease Father   . Kidney failure Father   . Kidney disease Father   . Diabetes Maternal Grandmother   . Breast cancer Maternal Grandmother   . Valvular heart disease Mother   . Liver cancer Paternal Uncle   . Liver cancer Paternal Grandmother   . Prostate cancer Neg Hx     Social History   Socioeconomic History  . Marital status: Married    Spouse name: Not on file  . Number of children: 2  . Years of education: Not on file  . Highest education level: Not on file  Occupational History  . Occupation: Academic librarian: unemployed    Comment: disabled due to kidney failure  Tobacco Use  . Smoking status: Former Smoker    Types: Cigars    Quit date: 09/02/1994    Years since quitting: 24.7  . Smokeless tobacco: Never Used  Substance and Sexual Activity  . Alcohol use: Yes    Alcohol/week: 0.0 - 1.0 standard drinks    Comment: occasional- 3 in the past year  . Drug use: No  . Sexual activity: Not on file  Other Topics Concern  . Not on file  Social History Narrative   In school for culinary arts      Has living will   Wife is health care POA   Would accept resuscitation attempts   Hasn't considered tube feedings   Social Determinants of Health   Financial Resource Strain:   . Difficulty of Paying Living Expenses:   Food Insecurity:   . Worried About Charity fundraiser in the Last Year:   . Arboriculturist in the Last Year:   Transportation Needs:   .  Lack of Transportation (Medical):   Marland Kitchen Lack of Transportation (Non-Medical):   Physical Activity:   . Days of Exercise per Week:   . Minutes of Exercise per Session:   Stress:     . Feeling of Stress :   Social Connections:   . Frequency of Communication with Friends and Family:   . Frequency of Social Gatherings with Friends and Family:   . Attends Religious Services:   . Active Member of Clubs or Organizations:   . Attends Archivist Meetings:   Marland Kitchen Marital Status:   Intimate Partner Violence:   . Fear of Current or Ex-Partner:   . Emotionally Abused:   Marland Kitchen Physically Abused:   . Sexually Abused:    Review of Systems Sleeps in bed---flat. No PND. Sleeps fairly well Appetite is okay Sugars reasonably controlled. 99-180 fasting    Objective:   Physical Exam  Constitutional: No distress.  Neck: No thyromegaly present.  Cardiovascular: Normal rate and regular rhythm. Exam reveals no gallop.  No murmur heard. Respiratory: Effort normal and breath sounds normal. No respiratory distress. He has no wheezes. He has no rales.  Musculoskeletal:        General: No edema.     Comments: Symmetric swelling and tenderness in hand PIPs Not in wrist or MCPs  Lymphadenopathy:    He has no cervical adenopathy.  Psychiatric: He has a normal mood and affect. His behavior is normal.           Assessment & Plan:

## 2019-06-13 ENCOUNTER — Other Ambulatory Visit: Payer: Self-pay | Admitting: Internal Medicine

## 2019-06-13 DIAGNOSIS — M05741 Rheumatoid arthritis with rheumatoid factor of right hand without organ or systems involvement: Secondary | ICD-10-CM

## 2019-06-13 NOTE — Progress Notes (Signed)
rheum

## 2019-06-15 LAB — CYCLIC CITRUL PEPTIDE ANTIBODY, IGG: Cyclic Citrullin Peptide Ab: <16 UNITS

## 2019-06-15 LAB — RHEUMATOID FACTOR: Rheumatoid fact SerPl-aCnc: 23 IU/mL — ABNORMAL HIGH (ref <14)

## 2019-06-24 DIAGNOSIS — I82612 Acute embolism and thrombosis of superficial veins of left upper extremity: Secondary | ICD-10-CM

## 2019-06-24 HISTORY — DX: Acute embolism and thrombosis of superficial veins of left upper extremity: I82.612

## 2019-06-26 DIAGNOSIS — E662 Morbid (severe) obesity with alveolar hypoventilation: Secondary | ICD-10-CM | POA: Diagnosis not present

## 2019-06-26 DIAGNOSIS — G4733 Obstructive sleep apnea (adult) (pediatric): Secondary | ICD-10-CM | POA: Diagnosis not present

## 2019-06-30 ENCOUNTER — Emergency Department: Payer: 59

## 2019-06-30 ENCOUNTER — Inpatient Hospital Stay
Admission: EM | Admit: 2019-06-30 | Discharge: 2019-07-04 | DRG: 871 | Disposition: A | Discharge status: Home / Self Care | Payer: 59 | Attending: Internal Medicine | Admitting: Internal Medicine

## 2019-06-30 ENCOUNTER — Other Ambulatory Visit: Payer: Self-pay

## 2019-06-30 DIAGNOSIS — Z800 Family history of malignant neoplasm of digestive organs: Secondary | ICD-10-CM | POA: Diagnosis not present

## 2019-06-30 DIAGNOSIS — I4821 Permanent atrial fibrillation: Secondary | ICD-10-CM | POA: Diagnosis present

## 2019-06-30 DIAGNOSIS — G4730 Sleep apnea, unspecified: Secondary | ICD-10-CM | POA: Diagnosis present

## 2019-06-30 DIAGNOSIS — J9601 Acute respiratory failure with hypoxia: Secondary | ICD-10-CM | POA: Diagnosis present

## 2019-06-30 DIAGNOSIS — N1831 Chronic kidney disease, stage 3a: Secondary | ICD-10-CM | POA: Diagnosis present

## 2019-06-30 DIAGNOSIS — A419 Sepsis, unspecified organism: Secondary | ICD-10-CM

## 2019-06-30 DIAGNOSIS — Z79899 Other long term (current) drug therapy: Secondary | ICD-10-CM

## 2019-06-30 DIAGNOSIS — I130 Hypertensive heart and chronic kidney disease with heart failure and stage 1 through stage 4 chronic kidney disease, or unspecified chronic kidney disease: Secondary | ICD-10-CM | POA: Diagnosis present

## 2019-06-30 DIAGNOSIS — Z803 Family history of malignant neoplasm of breast: Secondary | ICD-10-CM | POA: Diagnosis not present

## 2019-06-30 DIAGNOSIS — U071 COVID-19: Secondary | ICD-10-CM | POA: Diagnosis present

## 2019-06-30 DIAGNOSIS — J189 Pneumonia, unspecified organism: Secondary | ICD-10-CM | POA: Diagnosis present

## 2019-06-30 DIAGNOSIS — A4189 Other specified sepsis: Principal | ICD-10-CM | POA: Diagnosis present

## 2019-06-30 DIAGNOSIS — I10 Essential (primary) hypertension: Secondary | ICD-10-CM

## 2019-06-30 DIAGNOSIS — F329 Major depressive disorder, single episode, unspecified: Secondary | ICD-10-CM | POA: Diagnosis present

## 2019-06-30 DIAGNOSIS — Z951 Presence of aortocoronary bypass graft: Secondary | ICD-10-CM

## 2019-06-30 DIAGNOSIS — Z885 Allergy status to narcotic agent status: Secondary | ICD-10-CM

## 2019-06-30 DIAGNOSIS — E871 Hypo-osmolality and hyponatremia: Secondary | ICD-10-CM | POA: Diagnosis present

## 2019-06-30 DIAGNOSIS — Z833 Family history of diabetes mellitus: Secondary | ICD-10-CM | POA: Diagnosis not present

## 2019-06-30 DIAGNOSIS — E1122 Type 2 diabetes mellitus with diabetic chronic kidney disease: Secondary | ICD-10-CM | POA: Diagnosis present

## 2019-06-30 DIAGNOSIS — I2510 Atherosclerotic heart disease of native coronary artery without angina pectoris: Secondary | ICD-10-CM | POA: Diagnosis present

## 2019-06-30 DIAGNOSIS — Z940 Kidney transplant status: Secondary | ICD-10-CM | POA: Diagnosis not present

## 2019-06-30 DIAGNOSIS — Z794 Long term (current) use of insulin: Secondary | ICD-10-CM

## 2019-06-30 DIAGNOSIS — J810 Acute pulmonary edema: Secondary | ICD-10-CM | POA: Diagnosis not present

## 2019-06-30 DIAGNOSIS — R001 Bradycardia, unspecified: Secondary | ICD-10-CM

## 2019-06-30 DIAGNOSIS — Z841 Family history of disorders of kidney and ureter: Secondary | ICD-10-CM

## 2019-06-30 DIAGNOSIS — R05 Cough: Secondary | ICD-10-CM | POA: Diagnosis not present

## 2019-06-30 DIAGNOSIS — Z8616 Personal history of COVID-19: Secondary | ICD-10-CM

## 2019-06-30 DIAGNOSIS — N400 Enlarged prostate without lower urinary tract symptoms: Secondary | ICD-10-CM | POA: Diagnosis present

## 2019-06-30 DIAGNOSIS — I2721 Secondary pulmonary arterial hypertension: Secondary | ICD-10-CM | POA: Diagnosis present

## 2019-06-30 DIAGNOSIS — Z8249 Family history of ischemic heart disease and other diseases of the circulatory system: Secondary | ICD-10-CM

## 2019-06-30 DIAGNOSIS — R0602 Shortness of breath: Secondary | ICD-10-CM | POA: Diagnosis not present

## 2019-06-30 DIAGNOSIS — Z886 Allergy status to analgesic agent status: Secondary | ICD-10-CM

## 2019-06-30 DIAGNOSIS — I25810 Atherosclerosis of coronary artery bypass graft(s) without angina pectoris: Secondary | ICD-10-CM | POA: Diagnosis not present

## 2019-06-30 DIAGNOSIS — I5043 Acute on chronic combined systolic (congestive) and diastolic (congestive) heart failure: Secondary | ICD-10-CM

## 2019-06-30 DIAGNOSIS — Z87891 Personal history of nicotine dependence: Secondary | ICD-10-CM

## 2019-06-30 DIAGNOSIS — K219 Gastro-esophageal reflux disease without esophagitis: Secondary | ICD-10-CM | POA: Diagnosis present

## 2019-06-30 DIAGNOSIS — Z7901 Long term (current) use of anticoagulants: Secondary | ICD-10-CM

## 2019-06-30 DIAGNOSIS — J1282 Pneumonia due to coronavirus disease 2019: Secondary | ICD-10-CM | POA: Diagnosis present

## 2019-06-30 DIAGNOSIS — R6520 Severe sepsis without septic shock: Secondary | ICD-10-CM | POA: Diagnosis not present

## 2019-06-30 HISTORY — DX: Personal history of COVID-19: Z86.16

## 2019-06-30 LAB — CBC WITH DIFFERENTIAL/PLATELET
Abs Immature Granulocytes: 0.02 10*3/uL (ref 0.00–0.07)
Basophils Absolute: 0 10*3/uL (ref 0.0–0.1)
Basophils Relative: 0 %
Eosinophils Absolute: 0 10*3/uL (ref 0.0–0.5)
Eosinophils Relative: 0 %
HCT: 36.1 % — ABNORMAL LOW (ref 39.0–52.0)
Hemoglobin: 11.4 g/dL — ABNORMAL LOW (ref 13.0–17.0)
Immature Granulocytes: 0 %
Lymphocytes Relative: 19 %
Lymphs Abs: 0.9 10*3/uL (ref 0.7–4.0)
MCH: 24.7 pg — ABNORMAL LOW (ref 26.0–34.0)
MCHC: 31.6 g/dL (ref 30.0–36.0)
MCV: 78.1 fL — ABNORMAL LOW (ref 80.0–100.0)
Monocytes Absolute: 0.4 10*3/uL (ref 0.1–1.0)
Monocytes Relative: 8 %
Neutro Abs: 3.4 10*3/uL (ref 1.7–7.7)
Neutrophils Relative %: 73 %
Platelets: 98 10*3/uL — ABNORMAL LOW (ref 150–400)
RBC: 4.62 MIL/uL (ref 4.22–5.81)
RDW: 20.5 % — ABNORMAL HIGH (ref 11.5–15.5)
WBC: 4.8 10*3/uL (ref 4.0–10.5)
nRBC: 0 % (ref 0.0–0.2)

## 2019-06-30 LAB — COMPREHENSIVE METABOLIC PANEL
ALT: 12 U/L (ref 0–44)
AST: 23 U/L (ref 15–41)
Albumin: 3 g/dL — ABNORMAL LOW (ref 3.5–5.0)
Alkaline Phosphatase: 98 U/L (ref 38–126)
Anion gap: 8 (ref 5–15)
BUN: 47 mg/dL — ABNORMAL HIGH (ref 6–20)
CO2: 23 mmol/L (ref 22–32)
Calcium: 8.5 mg/dL — ABNORMAL LOW (ref 8.9–10.3)
Chloride: 103 mmol/L (ref 98–111)
Creatinine, Ser: 1.78 mg/dL — ABNORMAL HIGH (ref 0.61–1.24)
GFR calc Af Amer: 47 mL/min — ABNORMAL LOW (ref >60)
GFR calc non Af Amer: 41 mL/min — ABNORMAL LOW (ref >60)
Glucose, Bld: 149 mg/dL — ABNORMAL HIGH (ref 70–99)
Potassium: 4.5 mmol/L (ref 3.5–5.1)
Sodium: 134 mmol/L — ABNORMAL LOW (ref 135–145)
Total Bilirubin: 1.2 mg/dL (ref 0.3–1.2)
Total Protein: 7.4 g/dL (ref 6.5–8.1)

## 2019-06-30 LAB — PROTIME-INR
INR: 1.7 — ABNORMAL HIGH (ref 0.8–1.2)
Prothrombin Time: 19.5 seconds — ABNORMAL HIGH (ref 11.4–15.2)

## 2019-06-30 LAB — GLUCOSE, CAPILLARY: Glucose-Capillary: 138 mg/dL — ABNORMAL HIGH (ref 70–99)

## 2019-06-30 LAB — LACTIC ACID, PLASMA: Lactic Acid, Venous: 0.9 mmol/L (ref 0.5–1.9)

## 2019-06-30 MED ORDER — SODIUM CHLORIDE 0.9 % IV BOLUS
500.0000 mL | Freq: Once | INTRAVENOUS | Status: AC
  Administered 2019-06-30: 500 mL via INTRAVENOUS

## 2019-06-30 MED ORDER — FENTANYL CITRATE (PF) 100 MCG/2ML IJ SOLN
100.0000 ug | INTRAMUSCULAR | Status: DC | PRN
  Administered 2019-06-30: 100 ug via INTRAVENOUS
  Filled 2019-06-30: qty 2

## 2019-06-30 MED ORDER — VANCOMYCIN HCL IN DEXTROSE 1-5 GM/200ML-% IV SOLN
1000.0000 mg | Freq: Once | INTRAVENOUS | Status: AC
  Administered 2019-06-30: 1000 mg via INTRAVENOUS
  Filled 2019-06-30: qty 200

## 2019-06-30 MED ORDER — METRONIDAZOLE IN NACL 5-0.79 MG/ML-% IV SOLN
500.0000 mg | Freq: Once | INTRAVENOUS | Status: AC
  Administered 2019-06-30: 500 mg via INTRAVENOUS
  Filled 2019-06-30: qty 100

## 2019-06-30 MED ORDER — VANCOMYCIN HCL 1250 MG/250ML IV SOLN
1250.0000 mg | Freq: Once | INTRAVENOUS | Status: AC
  Administered 2019-07-01: 1250 mg via INTRAVENOUS
  Filled 2019-06-30: qty 250

## 2019-06-30 MED ORDER — ONDANSETRON HCL 4 MG/2ML IJ SOLN
4.0000 mg | Freq: Once | INTRAMUSCULAR | Status: AC
  Administered 2019-06-30: 4 mg via INTRAVENOUS
  Filled 2019-06-30: qty 2

## 2019-06-30 MED ORDER — SODIUM CHLORIDE 0.9 % IV SOLN
2.0000 g | Freq: Once | INTRAVENOUS | Status: AC
  Administered 2019-06-30: 2 g via INTRAVENOUS
  Filled 2019-06-30: qty 2

## 2019-06-30 MED ORDER — ACETAMINOPHEN 325 MG PO TABS
650.0000 mg | ORAL_TABLET | Freq: Once | ORAL | Status: AC
  Administered 2019-06-30: 650 mg via ORAL
  Filled 2019-06-30: qty 2

## 2019-06-30 NOTE — Progress Notes (Signed)
PHARMACY -  BRIEF ANTIBIOTIC NOTE   Pharmacy has received consult(s) for vancomycin from an ED provider.  The patient's profile has been reviewed for ht/wt/allergies/indication/available labs.    One time order(s) placed for vancomycin 2.25g IV load  Further antibiotics/pharmacy consults should be ordered by admitting physician if indicated.                       Thank you,  Tobie Lords, PharmD, BCPS Clinical Pharmacist 06/30/2019  10:42 PM

## 2019-06-30 NOTE — ED Notes (Signed)
Pt requesting CBG done because hes nauseated.  BS 138

## 2019-06-30 NOTE — ED Provider Notes (Signed)
[facility] Emergency Department Provider Note    First MD Initiated Contact with Patient 06/30/19 2201     (approximate)  I have reviewed the triage vital signs and the nursing notes.   HISTORY  Chief Complaint Weakness   HPI Carl Xachary Hambly. is a 59 y.o. male   [place] past medical history presents to the ER for generalized fatigue malaise decreased p.o. intake nausea vomiting epigastric discomfort progressively worsening over the past several days.  He is having productive cough as well.  He is on immunomodulatory therapy for history of kidney transplant.  No recent antibiotics.  Is having multiple episodes of nonbloody nonbilious diarrhea.  This he feels very dehydrated and states that he is 7 kg down from his typical weight.   Past Medical History:  Diagnosis Date  . Allergy    seasonal  . Amnestic MCI (mild cognitive impairment with memory loss)   . Asthma   . Benign neoplasm of colon   . CAD (coronary artery disease) 2005   a.  Status post four-vessel CABG in 2005; b. 03/2018 MV: Small, fixed inferoapical and apical septal defect. No ischemia. EF 47% (60-65% by 05/2018 Echo).  . Chronic atrial fibrillation ([place])    a. CHADS2VASc 4 (CHF, HTN, DM, vascular disease)--> Eliquis; b. 09/2018 Zio: Chronic Afib.  . Chronic combined systolic and diastolic CHF (congestive heart failure) ([place])    a.  7/18 Echo: EF 45-50%; b. 05/2018 Echo: EF 60-65%, RVSP 74.8; c. 12/2018 Echo: EF 50-55%. Sev dil LA.  Marland Kitchen Chronic venous stasis dermatitis of both lower extremities   . Cytomegaloviral disease ([place]) 2017  . Depression   . Diabetes mellitus   . Diverticulosis   . Dyspnea   . Erectile dysfunction   . ESRD (end stage renal disease) ([place]) 2005   a. HD 2004-2012; b. S/p renal transplant in 2012  . GERD (gastroesophageal reflux disease)   . Gout   . Hyperlipidemia    low since renal failure  . Hypertension   . Kidney transplant status, cadaveric 2012  .  Obesity   . PAH (pulmonary artery hypertension) ([place])    a. 05/2018 Echo: RVSP 74.72mHg; b. 12/2018 RV not well visualized.  . Pneumonia   . Purpura (HGas   . Sleep apnea    bipap with oxygen 2 l  . Trigger finger   . Ulcer 05/2016   Left shin  . Wears glasses    Family History  Problem Relation Age of Onset  . Heart disease Father   . Kidney failure Father   . Kidney disease Father   . Diabetes Maternal Grandmother   . Breast cancer Maternal Grandmother   . Valvular heart disease Mother   . Liver cancer Paternal Uncle   . Liver cancer Paternal Grandmother   . Prostate cancer Neg Hx    Past Surgical History:  Procedure Laterality Date  . BARIATRIC SURGERY    . CARDIAC CATHETERIZATION     [facility]  . CARDIAC CATHETERIZATION  05/03/2019  . CATARACT EXTRACTION W/PHACO Right 10/29/2017   Procedure: CATARACT EXTRACTION PHACO AND INTRAOCULAR LENS PLACEMENT ([place]  RIGHT DIABETIC;  Surgeon: BLeandrew Koyanagi MD;  [place]  Service: Ophthalmology;  Laterality: Right;  Diabetic - insulin  . CATARACT EXTRACTION W/PHACO Left 11/18/2017   Procedure: CATARACT EXTRACTION PHACO AND INTRAOCULAR LENS PLACEMENT ([place] LEFT IVA/TOPICAL;  Surgeon: BLeandrew Koyanagi MD;  [place]  Service: Ophthalmology;  Laterality: Left;  DIABETES -  insulin sleep apnea  . COLONOSCOPY WITH PROPOFOL N/A 04/22/2013   Procedure: COLONOSCOPY WITH PROPOFOL;  Surgeon: [name], MD;  [facility]: [facility];  Service: Endoscopy;  Laterality: N/A;  . CORONARY ARTERY BYPASS GRAFT  2005   X 4  . DG ANGIO AV SHUNT*L*     right and left upper arms  . FASCIOTOMY  03/03/2012   Procedure: FASCIOTOMY;  Surgeon: [name], MD;  Location: [place];  Service: Orthopedics;  Laterality: Right;  FASCIOTOMY RIGHT SMALL FINGER  . FASCIOTOMY Left 08/17/2013   Procedure: FASCIOTOMY LEFT RING;  Surgeon: [name], MD;  Location: [place];  Service:  Orthopedics;  Laterality: Left;  . INCISION AND DRAINAGE ABSCESS Left 10/15/2015   Procedure: INCISION AND DRAINAGE ABSCESS;  Surgeon: [name], MD;  Location: [facility];  Service: General;  Laterality: Left;  . KIDNEY TRANSPLANT  09/13/2010   cadaver--at [facility]  . RIGHT HEART CATH N/A 11/15/2016   Procedure: RIGHT HEART CATH;  Surgeon: [name], MD;  Location: [place] [facility];  Service: Cardiovascular;  Laterality: N/A;  . RIGHT HEART CATH N/A 05/03/2019   Procedure: RIGHT HEART CATH;  Surgeon: [name], MD;  Location: [place] [facility];  Service: Cardiovascular;  Laterality: N/A;  . TYMPANIC MEMBRANE REPAIR  1/12   left  . VASECTOMY     Patient Active Problem List   Diagnosis Date Noted  . Synovitis of finger 06/11/2019  . Chronic combined systolic and diastolic CHF (congestive heart failure) ([place])   . Acute systolic CHF (congestive heart failure) ([place]) 05/03/2019  . Pulmonary hypertension due to left heart disease ([place]) 01/20/2019  . Right knee pain 01/20/2019  . Chest pain with high risk of acute coronary syndrome 01/15/2019  . Trigger finger, unspecified little finger 12/23/2018  . Superficial thrombophlebitis 12/23/2018  . Fever 11/10/2018  . SOB (shortness of breath) 11/06/2018  . CHF (congestive heart failure) ([place]) 11/05/2018  . MDD (major depressive disorder), single episode, mild ([place]) 04/29/2018  . Acute gout 03/30/2018  . Cervical sprain, initial encounter 03/02/2018  . Moderate persistent asthma 05/21/2017  . Cough 02/10/2017  . Chronic diastolic heart failure ([place]) 11/19/2016  . Chronic venous insufficiency 06/05/2016  . Chronic ulcer of left leg ([place]) 10/26/2015  . Cytomegalovirus (CMV) viremia ([place]) 09/07/2015  . Proteinuria 06/23/2015  . Hypokalemia 10/13/2014  . BPH with obstruction/lower urinary tract symptoms 09/25/2014  . Hypogonadism in male 09/25/2014  . Immunosuppressed status ([place]) 09/09/2014  . Type 2 diabetes mellitus with  hyperglycemia, with long-term current use of insulin ([place]) 09/09/2014  . Chronic atrial fibrillation ([place]) 07/18/2014  . DM eye manif type II 10/27/2013  . MDD (recurrent major depressive disorder) in remission ([place]) 10/04/2013  . Benign neoplasm of colon 04/22/2013  . Diverticulosis of colon (without mention of hemorrhage) 04/22/2013  . History of renal transplant 03/01/2013  . Asthma, persistent controlled 02/25/2013  . Erectile dysfunction 10/20/2012  . Obstructive sleep apnea 09/29/2012  . Obesity hypoventilation syndrome ([place]) 03/31/2012  . Routine general medical examination at a health care facility 11/12/2011  . Type 2 diabetes, controlled, with renal manifestation ([place]) 03/01/2011  . Essential (primary) hypertension 12/17/2010  . CAD, multiple vessel 12/17/2010  . Hyperlipidemia   . ESRD (end stage renal disease) ([place])       Prior to Admission medications   Medication Sig Start Date End Date Taking? Authorizing Provider  albuterol (PROVENTIL HFA;VENTOLIN HFA) 108 (90 Base) MCG/ACT  inhaler Inhale 2 puffs every 6 (six) hours as needed into the lungs for wheezing or shortness of breath. 02/10/17   [name], MD  amLODipine (NORVASC) 5 MG tablet Take 1 tablet (5 mg total) by mouth daily. 06/10/19   [name], MD  apixaban (ELIQUIS) 5 MG TABS tablet Take 1 tablet (5 mg total) by mouth 2 (two) times daily. 04/05/19   [name], MD  buPROPion (WELLBUTRIN SR) 150 MG 12 hr tablet TAKE 1 TABLET BY MOUTH TWICE DAILY Patient taking differently: Take 150 mg by mouth 2 (two) times daily.  03/30/19   [name], MD  colchicine 0.6 MG tablet Take 1 tablet (0.6 mg total) by mouth daily. 05/07/19   Clegg, Amy D, NP  dapagliflozin propanediol (FARXIGA) 10 MG TABS tablet Take 10 mg by mouth daily before breakfast. 05/07/19   Clegg, Amy D, NP  ferrous sulfate 325 (65 FE) MG EC tablet Take 650 mg by mouth 2 (two) times daily.     [provider]  fluticasone  (FLONASE) 50 MCG/ACT nasal spray PLACE 2 SPRAYS INTO BOTH NOSTRILS DAILY. 05/25/19   [name], MD  gabapentin (NEURONTIN) 300 MG capsule Take 300 mg by mouth at bedtime.  12/01/18 12/01/19  [provider]  HUMALOG KWIKPEN 100 UNIT/ML KwikPen Inject 5-10 Units into the skin 3 (three) times daily. Sliding scale 09/01/18   [provider]  insulin glargine (LANTUS) 100 unit/mL SOPN Inject 0.1 mLs (10 Units total) into the skin at bedtime. Currently using 15 units Patient taking differently: Inject 28 Units into the skin at bedtime.  11/16/16   Fritzi Mandes, MD  lisinopril (ZESTRIL) 40 MG tablet Take 0.5 tablets (20 mg total) by mouth daily. 05/07/19   Clegg, Amy D, NP  metolazone (ZAROXOLYN) 2.5 MG tablet Take 2.5 mg by mouth daily as needed (fluid retention).     [provider]  montelukast (SINGULAIR) 10 MG tablet TAKE 1 TABLET BY MOUTH AT BEDTIME Patient taking differently: Take 10 mg by mouth at bedtime.  06/01/18   Laverle Hobby, MD  Multiple Vitamin (MULTIVITAMIN WITH MINERALS) TABS tablet Take 1 tablet by mouth daily.    [provider]  mycophenolate (MYFORTIC) 180 MG EC tablet Take 360 mg by mouth 2 (two) times daily.     [provider]  omeprazole (PRILOSEC) 20 MG capsule TAKE 1 CAPSULE BY MOUTH ONCE DAILY Patient taking differently: Take 20 mg by mouth daily.  02/21/16   [name], MD  potassium chloride (MICRO-K) 10 MEQ CR capsule Take 4 capsules (40 mEq total) by mouth every morning AND 2 capsules (20 mEq total) every evening. 04/26/19   Lyda Jester M, PA-C  rosuvastatin (CRESTOR) 10 MG tablet TAKE 1 TABLET BY MOUTH DAILY Patient taking differently: Take 10 mg by mouth daily.  06/09/18   [name], MD  tacrolimus (PROGRAF) 1 MG capsule Take 1 mg by mouth 2 (two) times daily.    [provider]  tadalafil, PAH, (ADCIRCA) 20 MG tablet Take 2 tablets (40 mg total) by mouth daily. Patient taking differently: Take  20 mg by mouth daily.  02/03/19   Theora Gianotti, NP  tamsulosin (FLOMAX) 0.4 MG CAPS capsule Take 0.4 mg by mouth daily.     [provider]  torsemide (DEMADEX) 20 MG tablet Take torsemide 80 mg in am and 40 mg in pm Patient taking differently: Take torsemide 40 mg in am and 40 mg in  pm 05/07/19   Clegg, Amy D, NP  traMADol (ULTRAM) 50 MG tablet Take 1 tablet (50 mg total) by mouth 3 (three) times daily as needed. Patient taking differently: Take 50 mg by mouth 3 (three) times daily as needed for moderate pain.  01/25/19   [name], MD    Allergies Morphine, Acetaminophen, and Ibuprofen    Social History Social History   Tobacco Use  . Smoking status: Former Smoker    Types: Cigars    Quit date: 09/02/1994    Years since quitting: 24.8  . Smokeless tobacco: Never Used  Substance Use Topics  . Alcohol use: Yes    Alcohol/week: 0.0 - 1.0 standard drinks    Comment: occasional- 3 in the past year  . Drug use: No    Review of Systems Patient denies headaches, rhinorrhea, blurry vision, numbness, shortness of breath, chest pain, edema, cough, abdominal pain, nausea, vomiting, diarrhea, dysuria, fevers, rashes or hallucinations unless otherwise stated above in HPI. ____________________________________________   PHYSICAL EXAM:  VITAL SIGNS: Vitals:   06/30/19 1956 06/30/19 2256  BP: 130/65 (!) 115/59  Pulse: 90 89  Resp: (!) 22 16  Temp: (!) 101.7 F (38.7 C) (!) 101.2 F (38.4 C)  SpO2: 93% 96%    Constitutional: Alert and oriented. Protecting airway Eyes: Conjunctivae are normal.  Head: Atraumatic. Nose: No congestion/rhinnorhea. Mouth/Throat: Mucous membranes are moist.   Neck: No stridor. Painless ROM.  Cardiovascular: Normal rate, regular rhythm. Grossly normal heart sounds.  Good peripheral circulation. Respiratory: coarse bibasilar breathsounds Gastrointestinal: Soft and nontender. No distention. No abdominal bruits. No CVA  tenderness. Genitourinary:  Musculoskeletal: No lower extremity tenderness,trace bilateral edema,  Chronic venous stasis changes. No joint effusions. Neurologic:  Normal speech and language. No gross focal neurologic deficits are appreciated. No facial droop Skin:  Skin is warm, dry and intact. No rash noted. Psychiatric: Mood and affect are normal. Speech and behavior are normal.  ____________________________________________   LABS (all labs ordered are listed, but only abnormal results are displayed)  Results for orders placed or performed during the hospital encounter of 06/30/19 (from the past 24 hour(s))  Comprehensive metabolic panel     Status: Abnormal   Collection Time: 06/30/19  8:06 PM  Result Value Ref Range   Sodium 134 (L) 135 - 145 mmol/L   Potassium 4.5 3.5 - 5.1 mmol/L   Chloride 103 98 - 111 mmol/L   CO2 23 22 - 32 mmol/L   Glucose, Bld 149 (H) 70 - 99 mg/dL   BUN 47 (H) 6 - 20 mg/dL   Creatinine, Ser 1.78 (H) 0.61 - 1.24 mg/dL   Calcium 8.5 (L) 8.9 - 10.3 mg/dL   Total Protein 7.4 6.5 - 8.1 g/dL   Albumin 3.0 (L) 3.5 - 5.0 g/dL   AST 23 15 - 41 U/L   ALT 12 0 - 44 U/L   Alkaline Phosphatase 98 38 - 126 U/L   Total Bilirubin 1.2 0.3 - 1.2 mg/dL   GFR calc non Af Amer 41 (L) >60 mL/min   GFR calc Af Amer 47 (L) >60 mL/min   Anion gap 8 5 - 15  CBC with Differential     Status: Abnormal   Collection Time: 06/30/19  8:06 PM  Result Value Ref Range   WBC 4.8 4.0 - 10.5 K/uL   RBC 4.62 4.22 - 5.81 MIL/uL   Hemoglobin 11.4 (L) 13.0 - 17.0 g/dL   HCT 36.1 (L) 39.0 - 52.0 %  MCV 78.1 (L) 80.0 - 100.0 fL   MCH 24.7 (L) 26.0 - 34.0 pg   MCHC 31.6 30.0 - 36.0 g/dL   RDW 20.5 (H) 11.5 - 15.5 %   Platelets 98 (L) 150 - 400 K/uL   nRBC 0.0 0.0 - 0.2 %   Neutrophils Relative % 73 %   Neutro Abs 3.4 1.7 - 7.7 K/uL   Lymphocytes Relative 19 %   Lymphs Abs 0.9 0.7 - 4.0 K/uL   Monocytes Relative 8 %   Monocytes Absolute 0.4 0.1 - 1.0 K/uL   Eosinophils Relative 0  %   Eosinophils Absolute 0.0 0.0 - 0.5 K/uL   Basophils Relative 0 %   Basophils Absolute 0.0 0.0 - 0.1 K/uL   Immature Granulocytes 0 %   Abs Immature Granulocytes 0.02 0.00 - 0.07 K/uL  Protime-INR     Status: Abnormal   Collection Time: 06/30/19  8:06 PM  Result Value Ref Range   Prothrombin Time 19.5 (H) 11.4 - 15.2 seconds   INR 1.7 (H) 0.8 - 1.2  Lactic acid, plasma     Status: None   Collection Time: 06/30/19  8:07 PM  Result Value Ref Range   Lactic Acid, Venous 0.9 0.5 - 1.9 mmol/L  Glucose, capillary     Status: Abnormal   Collection Time: 06/30/19  9:37 PM  Result Value Ref Range   Glucose-Capillary 138 (H) 70 - 99 mg/dL   ____________________________________________  EKG My review and personal interpretation at Time: 23:24   Indication: fever, sob  Rate: 85  Rhythm: a fibrillation Axis: normal Other: lvh, nonspecific st abn ____________________________________________  RADIOLOGY  I personally reviewed all radiographic images ordered to evaluate for the above acute complaints and reviewed radiology reports and findings.  These findings were personally discussed with the patient.  Please see medical record for radiology report.  ____________________________________________   PROCEDURES  Procedure(s) performed:  .Critical Care Performed by: Merlyn Lot, MD Authorized by: Merlyn Lot, MD   Critical care provider statement:    Critical care time (minutes):  35   Critical care time was exclusive of:  Separately billable procedures and treating other patients   Critical care was necessary to treat or prevent imminent or life-threatening deterioration of the following conditions:  Sepsis and respiratory failure   Critical care was time spent personally by me on the following activities:  Development of treatment plan with patient or surrogate, discussions with consultants, evaluation of patient's response to treatment, examination of patient, obtaining  history from patient or surrogate, ordering and performing treatments and interventions, ordering and review of laboratory studies, ordering and review of radiographic studies, pulse oximetry, re-evaluation of patient's condition and review of old charts      Critical Care performed: yes ____________________________________________   INITIAL IMPRESSION / ASSESSMENT AND PLAN / ED COURSE  Pertinent labs & imaging results that were available during my care of the patient were reviewed by me and considered in my medical decision making (see chart for details).   DDX: Pneumonia, CHF, enteritis, Covid, electrolyte abnormality, UTI, bacteremia  Carl Arman Loy. is a 59 y.o. who presents to the ED with symptoms as described above.  Patient currently protecting his airway but is febrile.  Presentation is complicated by his history of transplant on immuno suppressive therapy.  Patient noted to be hypoxic requiring supplemental oxygen.  He is 7 kg down from his baseline therefore of a lower suspicion for CHF.  X-ray does show evidence of infiltrate  and given the fever I suspect that this is pneumonia.  Possible covid.  Will cover with broad-spectrum antibiotics.  Will give gentle IV hydration.  His abdominal exam is soft benign.  Will check urinalysis as well.  Patient will require hospitalization.     The patient was evaluated in Emergency Department today for the symptoms described in the history of present illness. He/she was evaluated in the context of the global COVID-19 pandemic, which necessitated consideration that the patient might be at risk for infection with the SARS-CoV-2 virus that causes COVID-19. Institutional protocols and algorithms that pertain to the evaluation of patients at risk for COVID-19 are in a state of rapid change based on information released by regulatory bodies including the CDC and federal and state organizations. These policies and algorithms were followed during  the patient's care in the ED.  As part of my medical decision making, I reviewed the following data within the [place] notes reviewed and incorporated, Labs reviewed, notes from prior ED visits and [place] Controlled Substance Database   ____________________________________________   FINAL CLINICAL IMPRESSION(S) / ED DIAGNOSES  Final diagnoses:  Sepsis with acute hypoxic respiratory failure without septic shock, due to unspecified organism [facility])      NEW MEDICATIONS STARTED DURING THIS VISIT:  New Prescriptions   No medications on file     Note:  This document was prepared using Dragon voice recognition software and may include unintentional dictation errors.    Merlyn Lot, MD 06/30/19 [phone]

## 2019-07-01 ENCOUNTER — Other Ambulatory Visit: Payer: Self-pay

## 2019-07-01 ENCOUNTER — Inpatient Hospital Stay: Payer: 59

## 2019-07-01 ENCOUNTER — Encounter: Payer: Self-pay | Admitting: Internal Medicine

## 2019-07-01 ENCOUNTER — Other Ambulatory Visit: Payer: 59

## 2019-07-01 DIAGNOSIS — I4821 Permanent atrial fibrillation: Secondary | ICD-10-CM | POA: Diagnosis present

## 2019-07-01 DIAGNOSIS — N400 Enlarged prostate without lower urinary tract symptoms: Secondary | ICD-10-CM | POA: Diagnosis present

## 2019-07-01 DIAGNOSIS — E871 Hypo-osmolality and hyponatremia: Secondary | ICD-10-CM | POA: Diagnosis present

## 2019-07-01 DIAGNOSIS — K219 Gastro-esophageal reflux disease without esophagitis: Secondary | ICD-10-CM | POA: Diagnosis present

## 2019-07-01 DIAGNOSIS — I2721 Secondary pulmonary arterial hypertension: Secondary | ICD-10-CM | POA: Diagnosis present

## 2019-07-01 DIAGNOSIS — R6520 Severe sepsis without septic shock: Secondary | ICD-10-CM | POA: Diagnosis not present

## 2019-07-01 DIAGNOSIS — I2510 Atherosclerotic heart disease of native coronary artery without angina pectoris: Secondary | ICD-10-CM | POA: Diagnosis present

## 2019-07-01 DIAGNOSIS — Z803 Family history of malignant neoplasm of breast: Secondary | ICD-10-CM | POA: Diagnosis not present

## 2019-07-01 DIAGNOSIS — I130 Hypertensive heart and chronic kidney disease with heart failure and stage 1 through stage 4 chronic kidney disease, or unspecified chronic kidney disease: Secondary | ICD-10-CM | POA: Diagnosis present

## 2019-07-01 DIAGNOSIS — I25810 Atherosclerosis of coronary artery bypass graft(s) without angina pectoris: Secondary | ICD-10-CM

## 2019-07-01 DIAGNOSIS — U071 COVID-19: Secondary | ICD-10-CM | POA: Diagnosis present

## 2019-07-01 DIAGNOSIS — J189 Pneumonia, unspecified organism: Secondary | ICD-10-CM

## 2019-07-01 DIAGNOSIS — R001 Bradycardia, unspecified: Secondary | ICD-10-CM | POA: Diagnosis present

## 2019-07-01 DIAGNOSIS — Z951 Presence of aortocoronary bypass graft: Secondary | ICD-10-CM | POA: Diagnosis not present

## 2019-07-01 DIAGNOSIS — N1831 Chronic kidney disease, stage 3a: Secondary | ICD-10-CM | POA: Diagnosis present

## 2019-07-01 DIAGNOSIS — Z841 Family history of disorders of kidney and ureter: Secondary | ICD-10-CM | POA: Diagnosis not present

## 2019-07-01 DIAGNOSIS — Z8249 Family history of ischemic heart disease and other diseases of the circulatory system: Secondary | ICD-10-CM | POA: Diagnosis not present

## 2019-07-01 DIAGNOSIS — I10 Essential (primary) hypertension: Secondary | ICD-10-CM | POA: Diagnosis not present

## 2019-07-01 DIAGNOSIS — Z800 Family history of malignant neoplasm of digestive organs: Secondary | ICD-10-CM | POA: Diagnosis not present

## 2019-07-01 DIAGNOSIS — E1122 Type 2 diabetes mellitus with diabetic chronic kidney disease: Secondary | ICD-10-CM | POA: Diagnosis present

## 2019-07-01 DIAGNOSIS — J810 Acute pulmonary edema: Secondary | ICD-10-CM

## 2019-07-01 DIAGNOSIS — F329 Major depressive disorder, single episode, unspecified: Secondary | ICD-10-CM | POA: Diagnosis present

## 2019-07-01 DIAGNOSIS — A4189 Other specified sepsis: Secondary | ICD-10-CM | POA: Diagnosis present

## 2019-07-01 DIAGNOSIS — A419 Sepsis, unspecified organism: Secondary | ICD-10-CM | POA: Diagnosis not present

## 2019-07-01 DIAGNOSIS — I5043 Acute on chronic combined systolic (congestive) and diastolic (congestive) heart failure: Secondary | ICD-10-CM | POA: Diagnosis present

## 2019-07-01 DIAGNOSIS — J1282 Pneumonia due to coronavirus disease 2019: Secondary | ICD-10-CM | POA: Diagnosis present

## 2019-07-01 DIAGNOSIS — Z940 Kidney transplant status: Secondary | ICD-10-CM | POA: Diagnosis not present

## 2019-07-01 DIAGNOSIS — Z833 Family history of diabetes mellitus: Secondary | ICD-10-CM | POA: Diagnosis not present

## 2019-07-01 DIAGNOSIS — J9601 Acute respiratory failure with hypoxia: Secondary | ICD-10-CM | POA: Diagnosis present

## 2019-07-01 DIAGNOSIS — G4730 Sleep apnea, unspecified: Secondary | ICD-10-CM | POA: Diagnosis present

## 2019-07-01 HISTORY — DX: Pneumonia, unspecified organism: J18.9

## 2019-07-01 LAB — URINALYSIS, COMPLETE (UACMP) WITH MICROSCOPIC
Bacteria, UA: NONE SEEN
Bilirubin Urine: NEGATIVE
Glucose, UA: 50 mg/dL — AB
Ketones, ur: NEGATIVE mg/dL
Leukocytes,Ua: NEGATIVE
Nitrite: NEGATIVE
Protein, ur: 100 mg/dL — AB
Specific Gravity, Urine: 1.015 (ref 1.005–1.030)
Squamous Epithelial / HPF: NONE SEEN (ref 0–5)
pH: 5 (ref 5.0–8.0)

## 2019-07-01 LAB — COMPREHENSIVE METABOLIC PANEL WITH GFR
ALT: 12 U/L (ref 0–44)
AST: 20 U/L (ref 15–41)
Albumin: 2.8 g/dL — ABNORMAL LOW (ref 3.5–5.0)
Alkaline Phosphatase: 87 U/L (ref 38–126)
Anion gap: 9 (ref 5–15)
BUN: 52 mg/dL — ABNORMAL HIGH (ref 6–20)
CO2: 22 mmol/L (ref 22–32)
Calcium: 8.4 mg/dL — ABNORMAL LOW (ref 8.9–10.3)
Chloride: 104 mmol/L (ref 98–111)
Creatinine, Ser: 1.9 mg/dL — ABNORMAL HIGH (ref 0.61–1.24)
GFR calc Af Amer: 44 mL/min — ABNORMAL LOW
GFR calc non Af Amer: 38 mL/min — ABNORMAL LOW
Glucose, Bld: 231 mg/dL — ABNORMAL HIGH (ref 70–99)
Potassium: 4.1 mmol/L (ref 3.5–5.1)
Sodium: 135 mmol/L (ref 135–145)
Total Bilirubin: 0.8 mg/dL (ref 0.3–1.2)
Total Protein: 6.7 g/dL (ref 6.5–8.1)

## 2019-07-01 LAB — RESPIRATORY PANEL BY RT PCR (FLU A&B, COVID)
Influenza A by PCR: NEGATIVE
Influenza B by PCR: NEGATIVE
SARS Coronavirus 2 by RT PCR: POSITIVE — AB

## 2019-07-01 LAB — CBC
HCT: 34.3 % — ABNORMAL LOW (ref 39.0–52.0)
HCT: 34 % — ABNORMAL LOW (ref 39.0–52.0)
Hemoglobin: 10.6 g/dL — ABNORMAL LOW (ref 13.0–17.0)
Hemoglobin: 10.7 g/dL — ABNORMAL LOW (ref 13.0–17.0)
MCH: 24.8 pg — ABNORMAL LOW (ref 26.0–34.0)
MCH: 24.7 pg — ABNORMAL LOW (ref 26.0–34.0)
MCHC: 30.9 g/dL (ref 30.0–36.0)
MCHC: 31.5 g/dL (ref 30.0–36.0)
MCV: 80.1 fL (ref 80.0–100.0)
MCV: 78.5 fL — ABNORMAL LOW (ref 80.0–100.0)
Platelets: 97 10*3/uL — ABNORMAL LOW (ref 150–400)
Platelets: 99 10*3/uL — ABNORMAL LOW (ref 150–400)
RBC: 4.28 MIL/uL (ref 4.22–5.81)
RBC: 4.33 MIL/uL (ref 4.22–5.81)
RDW: 20.6 % — ABNORMAL HIGH (ref 11.5–15.5)
RDW: 20.4 % — ABNORMAL HIGH (ref 11.5–15.5)
WBC: 4.4 10*3/uL (ref 4.0–10.5)
WBC: 3.6 10*3/uL — ABNORMAL LOW (ref 4.0–10.5)
nRBC: 0 % (ref 0.0–0.2)
nRBC: 0 % (ref 0.0–0.2)

## 2019-07-01 LAB — BLOOD CULTURE ID PANEL (REFLEXED)

## 2019-07-01 LAB — MAGNESIUM: Magnesium: 2.1 mg/dL (ref 1.7–2.4)

## 2019-07-01 LAB — LACTIC ACID, PLASMA: Lactic Acid, Venous: 0.8 mmol/L (ref 0.5–1.9)

## 2019-07-01 LAB — COMPREHENSIVE METABOLIC PANEL
ALT: 11 U/L (ref 0–44)
AST: 20 U/L (ref 15–41)
Albumin: 2.7 g/dL — ABNORMAL LOW (ref 3.5–5.0)
Alkaline Phosphatase: 84 U/L (ref 38–126)
Anion gap: 7 (ref 5–15)
BUN: 48 mg/dL — ABNORMAL HIGH (ref 6–20)
CO2: 24 mmol/L (ref 22–32)
Calcium: 8.2 mg/dL — ABNORMAL LOW (ref 8.9–10.3)
Chloride: 106 mmol/L (ref 98–111)
Creatinine, Ser: 1.88 mg/dL — ABNORMAL HIGH (ref 0.61–1.24)
GFR calc Af Amer: 44 mL/min — ABNORMAL LOW (ref >60)
GFR calc non Af Amer: 38 mL/min — ABNORMAL LOW (ref >60)
Glucose, Bld: 174 mg/dL — ABNORMAL HIGH (ref 70–99)
Potassium: 4.3 mmol/L (ref 3.5–5.1)
Sodium: 137 mmol/L (ref 135–145)
Total Bilirubin: 1.2 mg/dL (ref 0.3–1.2)
Total Protein: 6.6 g/dL (ref 6.5–8.1)

## 2019-07-01 LAB — EXPECTORATED SPUTUM ASSESSMENT W GRAM STAIN, RFLX TO RESP C

## 2019-07-01 LAB — TROPONIN I (HIGH SENSITIVITY)
Troponin I (High Sensitivity): 62 ng/L — ABNORMAL HIGH (ref <18)
Troponin I (High Sensitivity): 57 ng/L — ABNORMAL HIGH (ref <18)

## 2019-07-01 LAB — GLUCOSE, CAPILLARY
Glucose-Capillary: 146 mg/dL — ABNORMAL HIGH (ref 70–99)
Glucose-Capillary: 154 mg/dL — ABNORMAL HIGH (ref 70–99)
Glucose-Capillary: 180 mg/dL — ABNORMAL HIGH (ref 70–99)
Glucose-Capillary: 107 mg/dL — ABNORMAL HIGH (ref 70–99)

## 2019-07-01 LAB — C-REACTIVE PROTEIN: CRP: 16.7 mg/dL — ABNORMAL HIGH (ref <1.0)

## 2019-07-01 LAB — PROCALCITONIN: Procalcitonin: 0.54 ng/mL

## 2019-07-01 LAB — LACTATE DEHYDROGENASE
LDH: 155 U/L (ref 98–192)
LDH: 158 U/L (ref 98–192)

## 2019-07-01 LAB — ABO/RH: ABO/RH(D): O POS

## 2019-07-01 LAB — FIBRIN DERIVATIVES D-DIMER (ARMC ONLY): Fibrin derivatives D-dimer (ARMC): 1204.23 ng/mL (FEU) — ABNORMAL HIGH (ref 0.00–499.00)

## 2019-07-01 LAB — PHOSPHORUS: Phosphorus: 3.8 mg/dL (ref 2.5–4.6)

## 2019-07-01 LAB — BRAIN NATRIURETIC PEPTIDE: B Natriuretic Peptide: 246 pg/mL — ABNORMAL HIGH (ref 0.0–100.0)

## 2019-07-01 LAB — MRSA PCR SCREENING: MRSA by PCR: NEGATIVE

## 2019-07-01 MED ORDER — COLCHICINE 0.6 MG PO TABS
0.6000 mg | ORAL_TABLET | Freq: Every day | ORAL | Status: DC
  Filled 2019-07-01 (×2): qty 1

## 2019-07-01 MED ORDER — ONDANSETRON HCL 4 MG PO TABS: 4.0000 mg | ORAL_TABLET | Freq: Four times a day (QID) | ORAL | Status: DC | PRN

## 2019-07-01 MED ORDER — DEXAMETHASONE 4 MG PO TABS
6.0000 mg | ORAL_TABLET | Freq: Every day | ORAL | Status: DC
  Administered 2019-07-02 – 2019-07-04 (×3): 6 mg via ORAL
  Filled 2019-07-01 (×2): qty 2
  Filled 2019-07-01: qty 1.5
  Filled 2019-07-01: qty 2
  Filled 2019-07-01: qty 1.5

## 2019-07-01 MED ORDER — APIXABAN 5 MG PO TABS
5.0000 mg | ORAL_TABLET | Freq: Two times a day (BID) | ORAL | Status: DC
  Administered 2019-07-01 – 2019-07-04 (×6): 5 mg via ORAL
  Filled 2019-07-01 (×6): qty 1

## 2019-07-01 MED ORDER — ADULT MULTIVITAMIN W/MINERALS CH
1.0000 | ORAL_TABLET | Freq: Every day | ORAL | Status: DC
  Administered 2019-07-01 – 2019-07-04 (×3): 1 via ORAL
  Filled 2019-07-01 (×4): qty 1

## 2019-07-01 MED ORDER — PANTOPRAZOLE SODIUM 40 MG PO TBEC
40.0000 mg | DELAYED_RELEASE_TABLET | Freq: Every day | ORAL | Status: DC
  Administered 2019-07-01 – 2019-07-04 (×4): 40 mg via ORAL
  Filled 2019-07-01 (×4): qty 1

## 2019-07-01 MED ORDER — TRAMADOL HCL 50 MG PO TABS
50.0000 mg | ORAL_TABLET | Freq: Four times a day (QID) | ORAL | Status: DC | PRN
  Administered 2019-07-01 – 2019-07-03 (×2): 50 mg via ORAL
  Filled 2019-07-01 (×2): qty 1

## 2019-07-01 MED ORDER — FLUTICASONE PROPIONATE 50 MCG/ACT NA SUSP
2.0000 | Freq: Every day | NASAL | Status: DC
  Administered 2019-07-01 – 2019-07-04 (×4): 2 via NASAL
  Filled 2019-07-01 (×2): qty 16

## 2019-07-01 MED ORDER — ADULT MULTIVITAMIN W/MINERALS CH
1.0000 | ORAL_TABLET | Freq: Every day | ORAL | Status: DC
  Administered 2019-07-01 – 2019-07-02 (×2): 1 via ORAL
  Filled 2019-07-01 (×3): qty 1

## 2019-07-01 MED ORDER — THIAMINE HCL 100 MG PO TABS
100.0000 mg | ORAL_TABLET | Freq: Every day | ORAL | Status: DC
  Administered 2019-07-01 – 2019-07-04 (×4): 100 mg via ORAL
  Filled 2019-07-01 (×5): qty 1

## 2019-07-01 MED ORDER — BUPROPION HCL ER (SR) 150 MG PO TB12
150.0000 mg | ORAL_TABLET | Freq: Two times a day (BID) | ORAL | Status: DC
  Administered 2019-07-01 – 2019-07-04 (×6): 150 mg via ORAL
  Filled 2019-07-01 (×9): qty 1

## 2019-07-01 MED ORDER — SODIUM CHLORIDE 0.9 % IV SOLN
2.0000 g | INTRAVENOUS | Status: DC
  Administered 2019-07-01 – 2019-07-04 (×4): 2 g via INTRAVENOUS
  Filled 2019-07-01 (×2): qty 2
  Filled 2019-07-01 (×2): qty 20

## 2019-07-01 MED ORDER — SODIUM CHLORIDE 0.9 % IV SOLN: 500.0000 mg | INTRAVENOUS | Status: DC

## 2019-07-01 MED ORDER — TAMSULOSIN HCL 0.4 MG PO CAPS
0.4000 mg | ORAL_CAPSULE | Freq: Every day | ORAL | Status: DC
  Administered 2019-07-01 – 2019-07-04 (×4): 0.4 mg via ORAL
  Filled 2019-07-01 (×4): qty 1

## 2019-07-01 MED ORDER — CEFTRIAXONE SODIUM 1 G IJ SOLR: 1.0000 g | INTRAMUSCULAR | Status: DC

## 2019-07-01 MED ORDER — LISINOPRIL 20 MG PO TABS
20.0000 mg | ORAL_TABLET | Freq: Every day | ORAL | Status: DC
  Administered 2019-07-01 – 2019-07-02 (×2): 20 mg via ORAL
  Filled 2019-07-01: qty 2
  Filled 2019-07-01: qty 1

## 2019-07-01 MED ORDER — SODIUM CHLORIDE 0.9% FLUSH
3.0000 mL | Freq: Two times a day (BID) | INTRAVENOUS | Status: DC
  Administered 2019-07-01 – 2019-07-04 (×6): 3 mL via INTRAVENOUS

## 2019-07-01 MED ORDER — MYCOPHENOLATE SODIUM 180 MG PO TBEC
360.0000 mg | DELAYED_RELEASE_TABLET | Freq: Two times a day (BID) | ORAL | Status: DC
  Administered 2019-07-01 – 2019-07-04 (×6): 360 mg via ORAL
  Filled 2019-07-01 (×9): qty 2

## 2019-07-01 MED ORDER — ROSUVASTATIN CALCIUM 10 MG PO TABS
10.0000 mg | ORAL_TABLET | Freq: Every day | ORAL | Status: DC
  Administered 2019-07-01 – 2019-07-04 (×4): 10 mg via ORAL
  Filled 2019-07-01 (×6): qty 1

## 2019-07-01 MED ORDER — FOLIC ACID 1 MG PO TABS
1.0000 mg | ORAL_TABLET | Freq: Every day | ORAL | Status: DC
  Administered 2019-07-01 – 2019-07-04 (×4): 1 mg via ORAL
  Filled 2019-07-01 (×4): qty 1

## 2019-07-01 MED ORDER — SENNOSIDES-DOCUSATE SODIUM 8.6-50 MG PO TABS: 1.0000 | ORAL_TABLET | Freq: Every evening | ORAL | Status: DC | PRN

## 2019-07-01 MED ORDER — AMLODIPINE BESYLATE 5 MG PO TABS
5.0000 mg | ORAL_TABLET | Freq: Every day | ORAL | Status: DC
  Administered 2019-07-01 – 2019-07-02 (×2): 5 mg via ORAL
  Filled 2019-07-01 (×2): qty 1

## 2019-07-01 MED ORDER — TACROLIMUS 1 MG PO CAPS
1.0000 mg | ORAL_CAPSULE | Freq: Two times a day (BID) | ORAL | Status: DC
  Administered 2019-07-01 – 2019-07-04 (×6): 1 mg via ORAL
  Filled 2019-07-01 (×9): qty 1

## 2019-07-01 MED ORDER — SODIUM CHLORIDE 0.9 % IV SOLN
200.0000 mg | Freq: Once | INTRAVENOUS | Status: AC
  Administered 2019-07-01: 200 mg via INTRAVENOUS
  Filled 2019-07-01: qty 40

## 2019-07-01 MED ORDER — MONTELUKAST SODIUM 10 MG PO TABS
10.0000 mg | ORAL_TABLET | Freq: Every day | ORAL | Status: DC
  Administered 2019-07-01 – 2019-07-03 (×3): 10 mg via ORAL
  Filled 2019-07-01 (×4): qty 1

## 2019-07-01 MED ORDER — POTASSIUM CHLORIDE CRYS ER 10 MEQ PO TBCR
10.0000 meq | EXTENDED_RELEASE_TABLET | Freq: Every day | ORAL | Status: DC
  Administered 2019-07-01 – 2019-07-04 (×4): 10 meq via ORAL
  Filled 2019-07-01 (×4): qty 1

## 2019-07-01 MED ORDER — GABAPENTIN 300 MG PO CAPS
300.0000 mg | ORAL_CAPSULE | Freq: Every day | ORAL | Status: DC
  Administered 2019-07-01 – 2019-07-03 (×3): 300 mg via ORAL
  Filled 2019-07-01 (×3): qty 1

## 2019-07-01 MED ORDER — GUAIFENESIN-DM 100-10 MG/5ML PO SYRP: 5.0000 mL | ORAL_SOLUTION | ORAL | Status: DC | PRN

## 2019-07-01 MED ORDER — TADALAFIL (PAH) 20 MG PO TABS
40.0000 mg | ORAL_TABLET | Freq: Every day | ORAL | Status: DC
  Administered 2019-07-01 – 2019-07-04 (×4): 40 mg via ORAL
  Filled 2019-07-01 (×5): qty 2

## 2019-07-01 MED ORDER — INSULIN ASPART 100 UNIT/ML ~~LOC~~ SOLN
0.0000 [IU] | Freq: Three times a day (TID) | SUBCUTANEOUS | Status: DC
  Administered 2019-07-01: 2 [IU] via SUBCUTANEOUS
  Administered 2019-07-01: 1 [IU] via SUBCUTANEOUS
  Administered 2019-07-01: 2 [IU] via SUBCUTANEOUS
  Administered 2019-07-02: 1 [IU] via SUBCUTANEOUS
  Administered 2019-07-02: 7 [IU] via SUBCUTANEOUS
  Administered 2019-07-02: 1 [IU] via SUBCUTANEOUS
  Filled 2019-07-01 (×6): qty 1

## 2019-07-01 MED ORDER — SODIUM CHLORIDE 0.9 % IV BOLUS
250.0000 mL | Freq: Once | INTRAVENOUS | Status: AC
  Administered 2019-07-01: 250 mL via INTRAVENOUS

## 2019-07-01 MED ORDER — ALBUTEROL SULFATE HFA 108 (90 BASE) MCG/ACT IN AERS
2.0000 | INHALATION_SPRAY | Freq: Four times a day (QID) | RESPIRATORY_TRACT | Status: DC | PRN
  Filled 2019-07-01: qty 6.7

## 2019-07-01 MED ORDER — ONDANSETRON HCL 4 MG/2ML IJ SOLN: 4.0000 mg | Freq: Four times a day (QID) | INTRAMUSCULAR | Status: DC | PRN

## 2019-07-01 MED ORDER — TORSEMIDE 20 MG PO TABS
20.0000 mg | ORAL_TABLET | Freq: Two times a day (BID) | ORAL | Status: DC
  Administered 2019-07-01 – 2019-07-03 (×2): 20 mg via ORAL
  Filled 2019-07-01 (×4): qty 1

## 2019-07-01 MED ORDER — SODIUM CHLORIDE 0.9 % IV SOLN
100.0000 mg | Freq: Two times a day (BID) | INTRAVENOUS | Status: DC
  Administered 2019-07-01 – 2019-07-02 (×3): 100 mg via INTRAVENOUS
  Filled 2019-07-01 (×5): qty 100

## 2019-07-01 MED ORDER — SODIUM CHLORIDE 0.9 % IV SOLN
100.0000 mg | Freq: Every day | INTRAVENOUS | Status: DC
  Administered 2019-07-02 – 2019-07-04 (×3): 100 mg via INTRAVENOUS
  Filled 2019-07-01: qty 100
  Filled 2019-07-01: qty 20
  Filled 2019-07-01: qty 100

## 2019-07-01 MED ORDER — GUAIFENESIN ER 600 MG PO TB12
600.0000 mg | ORAL_TABLET | Freq: Two times a day (BID) | ORAL | Status: DC
  Administered 2019-07-01 – 2019-07-04 (×6): 600 mg via ORAL
  Filled 2019-07-01 (×7): qty 1

## 2019-07-01 MED ORDER — INSULIN GLARGINE 100 UNIT/ML ~~LOC~~ SOLN
10.0000 [IU] | Freq: Every day | SUBCUTANEOUS | Status: DC
  Administered 2019-07-01 – 2019-07-03 (×3): 10 [IU] via SUBCUTANEOUS
  Filled 2019-07-01 (×4): qty 0.1

## 2019-07-01 NOTE — ED Notes (Signed)
This Rn at bedside to attend to call bell to reposition pt. Pt provided with lunch tray.

## 2019-07-01 NOTE — Consult Note (Signed)
Remdesivir - Pharmacy Brief Note   A/P:  Remdesivir 200 mg IVPB once followed by 100 mg IVPB daily x 4 days.   Eleonore Chiquito, PharmD, BCPS 07/01/2019 12:40 PM

## 2019-07-01 NOTE — ED Notes (Signed)
Awaiting on pharmacy to verify and send medication due at 1630.

## 2019-07-01 NOTE — ED Notes (Signed)
Pharmacy messaged pharmacy requesting missing meds.

## 2019-07-01 NOTE — ED Notes (Signed)
MD Chattejee at bedside.

## 2019-07-01 NOTE — Progress Notes (Signed)
PHARMACY - PHYSICIAN COMMUNICATION CRITICAL VALUE ALERT - BLOOD CULTURE IDENTIFICATION (BCID)  Results for orders placed or performed during the hospital encounter of 06/30/19  Blood Culture ID Panel (Reflexed) (Collected: 06/30/2019  8:07 PM)  Result Value Ref Range   Enterococcus species NOT DETECTED NOT DETECTED   Listeria monocytogenes NOT DETECTED NOT DETECTED   Staphylococcus species DETECTED (A) NOT DETECTED   Staphylococcus aureus (BCID) NOT DETECTED NOT DETECTED   Methicillin resistance NOT DETECTED NOT DETECTED   Streptococcus species NOT DETECTED NOT DETECTED   Streptococcus agalactiae NOT DETECTED NOT DETECTED   Streptococcus pneumoniae NOT DETECTED NOT DETECTED   Streptococcus pyogenes NOT DETECTED NOT DETECTED   Acinetobacter baumannii NOT DETECTED NOT DETECTED   Enterobacteriaceae species NOT DETECTED NOT DETECTED   Enterobacter cloacae complex NOT DETECTED NOT DETECTED   Escherichia coli NOT DETECTED NOT DETECTED   Klebsiella oxytoca NOT DETECTED NOT DETECTED   Klebsiella pneumoniae NOT DETECTED NOT DETECTED   Proteus species NOT DETECTED NOT DETECTED   Serratia marcescens NOT DETECTED NOT DETECTED   Haemophilus influenzae NOT DETECTED NOT DETECTED   Neisseria meningitidis NOT DETECTED NOT DETECTED   Pseudomonas aeruginosa NOT DETECTED NOT DETECTED   Candida albicans NOT DETECTED NOT DETECTED   Candida glabrata NOT DETECTED NOT DETECTED   Candida krusei NOT DETECTED NOT DETECTED   Candida parapsilosis NOT DETECTED NOT DETECTED   Candida tropicalis NOT DETECTED NOT DETECTED   Current Antibiotics: ceftriaxone and doxycycline for PNA  Name of physician (or Provider) Contacted: Dr Jamse Arn  Changes to prescribed antibiotics required: at this point the organism is being considered a contaminant and no changes to antibiotics are recommended. Dr Jamse Arn has formulated the plan and is aware of the culture results  [name] 07/01/2019  2:37 PM

## 2019-07-01 NOTE — ED Notes (Signed)
Pt ambulated to the bathroom with a steady gait. Oxygen tank at bedside at this time.

## 2019-07-01 NOTE — H&P (Signed)
Chief Complaint: I have generalized weakness and joint pains HPI: [name]. is an 59 y.o. male with multiple medical comorbidities that include history of coronary disease status post four-vessel CABG in 2005, chronic atrial fibrillation on Eliquis, combined systolic and diastolic congestive heart failure with EF of 45 to 50%, history of end-stage renal disease status post renal transplant in 2012 and currently on Prograf.  Other comorbidities include pulmonary artery hypertension.  Patient has been in his usual state of health and is status post first dose of Covid vaccine 3 weeks ago.  He is scheduled for his next dose in the coming days.  Over the past 3 days, patient has experienced generalized malaise, multiple joint pains, loss of appetite and new onset diarrhea.  Onset of symptoms was sudden.  No known sick contacts except for wife who works in the hospital.  He has had poor oral intake over this period and states he is below his dry weight and feels dehydrated.  Past Medical History:  Diagnosis Date  . Allergy    seasonal  . Amnestic MCI (mild cognitive impairment with memory loss)   . Asthma   . Benign neoplasm of colon   . CAD (coronary artery disease) 2005   a.  Status post four-vessel CABG in 2005; b. 03/2018 MV: Small, fixed inferoapical and apical septal defect. No ischemia. EF 47% (60-65% by 05/2018 Echo).  . Chronic atrial fibrillation ([place])    a. CHADS2VASc 4 (CHF, HTN, DM, vascular disease)--> Eliquis; b. 09/2018 Zio: Chronic Afib.  . Chronic combined systolic and diastolic CHF (congestive heart failure) ([place])    a.  7/18 Echo: EF 45-50%; b. 05/2018 Echo: EF 60-65%, RVSP 74.8; c. 12/2018 Echo: EF 50-55%. Sev dil LA.  Marland Kitchen Chronic venous stasis dermatitis of both lower extremities   . Cytomegaloviral disease ([place]) 2017  . Depression   . Diabetes mellitus   . Diverticulosis   . Dyspnea   . Erectile dysfunction   . ESRD (end stage renal disease) ([name]) 2005   a. HD  2004-2012; b. S/p renal transplant in 2012  . GERD (gastroesophageal reflux disease)   . Gout   . Hyperlipidemia    low since renal failure  . Hypertension   . Kidney transplant status, cadaveric 2012  . Obesity   . PAH (pulmonary artery hypertension) ([place])    a. 05/2018 Echo: RVSP 74.58mHg; b. 12/2018 RV not well visualized.  . Pneumonia   . [place]pura ([place]   . Sleep apnea    bipap with oxygen 2 l  . Trigger finger   . Ulcer 05/2016   Left shin  . Wears glasses     Past Surgical History:  Procedure Laterality Date  . BARIATRIC SURGERY    . CARDIAC CATHETERIZATION     [facility]  . CARDIAC CATHETERIZATION  05/03/2019  . CATARACT EXTRACTION W/PHACO Right 10/29/2017   Procedure: CATARACT EXTRACTION PHACO AND INTRAOCULAR LENS PLACEMENT ([place]  RIGHT DIABETIC;  Surgeon: BLeandrew Koyanagi MD;  [place]  Service: Ophthalmology;  Laterality: Right;  Diabetic - insulin  . CATARACT EXTRACTION W/PHACO Left 11/18/2017   Procedure: CATARACT EXTRACTION PHACO AND INTRAOCULAR LENS PLACEMENT ([place] LEFT IVA/TOPICAL;  Surgeon: BLeandrew Koyanagi MD;  [place]  Service: Ophthalmology;  Laterality: Left;  DIABETES - insulin sleep apnea  . COLONOSCOPY WITH PROPOFOL N/A 04/22/2013   Procedure: COLONOSCOPY WITH PROPOFOL;  Surgeon: DMilus Banister MD;  [facility];  Service: Endoscopy;  Laterality: N/A;  .  CORONARY ARTERY BYPASS GRAFT  2005   X 4  . DG ANGIO AV SHUNT*L*     right and left upper arms  . FASCIOTOMY  03/03/2012   Procedure: FASCIOTOMY;  Surgeon: [name], MD;  Location: [place];  Service: Orthopedics;  Laterality: Right;  FASCIOTOMY RIGHT SMALL FINGER  . FASCIOTOMY Left 08/17/2013   Procedure: FASCIOTOMY LEFT RING;  Surgeon: [name], MD;  Location: [place];  Service: Orthopedics;  Laterality: Left;  . INCISION AND DRAINAGE ABSCESS Left 10/15/2015   Procedure: INCISION AND DRAINAGE ABSCESS;   Surgeon: [name], MD;  Location: [facility];  Service: General;  Laterality: Left;  . KIDNEY TRANSPLANT  09/13/2010   cadaver--at [facility]  . RIGHT HEART CATH N/A 11/15/2016   Procedure: RIGHT HEART CATH;  Surgeon: [name], MD;  Location: [place] [facility];  Service: Cardiovascular;  Laterality: N/A;  . RIGHT HEART CATH N/A 05/03/2019   Procedure: RIGHT HEART CATH;  Surgeon: [name], MD;  Location: [place] [facility];  Service: Cardiovascular;  Laterality: N/A;  . TYMPANIC MEMBRANE REPAIR  1/12   left  . VASECTOMY      Family History  Problem Relation Age of Onset  . Heart disease Father   . Kidney failure Father   . Kidney disease Father   . Diabetes Maternal Grandmother   . Breast cancer Maternal Grandmother   . Valvular heart disease Mother   . Liver cancer Paternal Uncle   . Liver cancer Paternal Grandmother   . Prostate cancer Neg Hx    Social History:  reports that he quit smoking about 24 years ago. His smoking use included cigars. He has never used smokeless tobacco. He reports current alcohol use. He reports that he does not use drugs.  Allergies:  Allergies  Allergen Reactions  . Morphine Itching  . Acetaminophen Other (See Comments)    BC of transplant  . Ibuprofen Other (See Comments)    Due to kidney transplant    (Not in a hospital admission)   Results for orders placed or performed during the hospital encounter of 06/30/19 (from the past 48 hour(s))  Comprehensive metabolic panel     Status: Abnormal   Collection Time: 06/30/19  8:06 PM  Result Value Ref Range   Sodium 134 (L) 135 - 145 mmol/L   Potassium 4.5 3.5 - 5.1 mmol/L   Chloride 103 98 - 111 mmol/L   CO2 23 22 - 32 mmol/L   Glucose, Bld 149 (H) 70 - 99 mg/dL    Comment: Glucose reference range applies only to samples taken after fasting for at least 8 hours.   BUN 47 (H) 6 - 20 mg/dL   Creatinine, Ser 1.78 (H) 0.61 - 1.24 mg/dL   Calcium 8.5 (L) 8.9 - 10.3 mg/dL   Total  Protein 7.4 6.5 - 8.1 g/dL   Albumin 3.0 (L) 3.5 - 5.0 g/dL   AST 23 15 - 41 U/L   ALT 12 0 - 44 U/L   Alkaline Phosphatase 98 38 - 126 U/L   Total Bilirubin 1.2 0.3 - 1.2 mg/dL   GFR calc non Af Amer 41 (L) >60 mL/min   GFR calc Af Amer 47 (L) >60 mL/min   Anion gap 8 5 - 15    Comment: Performed at [facility], [address], [place], [place] [address]  CBC with Differential     Status: Abnormal   Collection  Time: 06/30/19  8:06 PM  Result Value Ref Range   WBC 4.8 4.0 - 10.5 K/uL   RBC 4.62 4.22 - 5.81 MIL/uL   Hemoglobin 11.4 (L) 13.0 - 17.0 g/dL   HCT 36.1 (L) 39.0 - 52.0 %   MCV 78.1 (L) 80.0 - 100.0 fL   MCH 24.7 (L) 26.0 - 34.0 pg   MCHC 31.6 30.0 - 36.0 g/dL   RDW 20.5 (H) 11.5 - 15.5 %   Platelets 98 (L) 150 - 400 K/uL    Comment: Immature Platelet Fraction may be clinically indicated, consider ordering this additional test [id number]    nRBC 0.0 0.0 - 0.2 %   Neutrophils Relative % 73 %   Neutro Abs 3.4 1.7 - 7.7 K/uL   Lymphocytes Relative 19 %   Lymphs Abs 0.9 0.7 - 4.0 K/uL   Monocytes Relative 8 %   Monocytes Absolute 0.4 0.1 - 1.0 K/uL   Eosinophils Relative 0 %   Eosinophils Absolute 0.0 0.0 - 0.5 K/uL   Basophils Relative 0 %   Basophils Absolute 0.0 0.0 - 0.1 K/uL   Immature Granulocytes 0 %   Abs Immature Granulocytes 0.02 0.00 - 0.07 K/uL    Comment: Performed at [facility], [address] [place], [place] [address]  Protime-INR     Status: Abnormal   Collection Time: 06/30/19  8:06 PM  Result Value Ref Range   Prothrombin Time 19.5 (H) 11.4 - 15.2 seconds   INR 1.7 (H) 0.8 - 1.2    Comment: (NOTE) INR goal varies based on device and disease states. Performed at [facility], [address] [place], [place] [address]   Lactic acid, plasma     Status: None   Collection Time: 06/30/19  8:07 PM  Result Value Ref Range   Lactic Acid, Venous 0.9 0.5 - 1.9 mmol/L    Comment: Performed at [facility],  [address] [place], [place] [address]  Glucose, capillary     Status: Abnormal   Collection Time: 06/30/19  9:37 PM  Result Value Ref Range   Glucose-Capillary 138 (H) 70 - 99 mg/dL    Comment: Glucose reference range applies only to samples taken after fasting for at least 8 hours.   DG Chest Port 1 View  Result Date: 06/30/2019 CLINICAL DATA:  59 year old male with cough and congestion. EXAM: PORTABLE CHEST 1 VIEW COMPARISON:  Chest radiograph dated 05/04/2019. FINDINGS: There is cardiomegaly with vascular congestion and probable edema. Diffuse interstitial nodularity may be related to vascular congestion although developing infiltrate is not excluded clinical correlation is recommended. There is no pleural effusion or pneumothorax. Calcified right paratracheal granuloma. Median sternotomy wires and CABG vascular clips. No acute osseous pathology. IMPRESSION: Cardiomegaly with findings of CHF. Pneumonia is not excluded. Electronically Signed   By: Anner Crete M.D.   On: 06/30/2019 22:34    Review of Systems  Constitutional: Positive for appetite change, fatigue and unexpected weight change.  HENT: Negative.   Respiratory: Positive for shortness of breath.   Cardiovascular: Negative for chest pain, palpitations and leg swelling.  Gastrointestinal: Negative.   Musculoskeletal: Negative.   Neurological: Negative.   Hematological: Negative.   Psychiatric/Behavioral: Negative.     Blood pressure (!) 126/58, pulse 74, temperature (!) 101.2 F (38.4 C), temperature source Oral, resp. rate 15, height _0  (1.803 m), weight 112 kg, SpO2 97 %. Physical Exam  Constitutional: He is oriented to person, place, and time. He appears well-developed  and well-nourished.  Cardiovascular: Normal rate and regular rhythm. Exam reveals distant heart sounds.  Respiratory: No accessory muscle usage. No respiratory distress. He has decreased breath sounds in the right lower field and the left lower  field.  GI: Soft. Bowel sounds are normal.  Neurological: He is alert and oriented to person, place, and time. He has normal reflexes.  Skin: Skin is warm and dry.     Assessment/Plan  #1.  Acute generalized weakness associated with fever plus chest x-ray findings, concerning for viral pneumonia.  Covid testing pending.  Patient will be kept in isolation until results are confirmed.  Patient is status post 1 dose of vaccine 3 weeks ago.  #2.  Suspected bilateral pneumonia likely viral versus bacterial etiology.  Due to immunosuppressed status of patient, patient will be empirically covered with IV antibiotics.  We will follow up with sputum and blood cultures as appropriate.  Tailor antibiotics accordingly.  ID consult may be warranted in a.m.  #3.  Chronic combined CHF.  Patient is on diuretics at home.  At this time, no evidence of decompensation at this time except for chest x-ray findings.  We will continue with p.o diuretics as per home regimen.  Patient feels dehydrated but renal function within normal limits.  #4.  Chronic kidney disease stage IIIa-stable renal function.  Avoid nephrotoxic medication.  #5.  Chronic atrial fibrillation, rate controlled.EKG reviewed.  Patient on chronic anticoagulation based on chads vasc score.  We will continue with same.  #6.  History of coronary disease status post CABG in the past.  EKG shows chronic atrial fibrillation without any ST-T wave changes.  Will obtain cardiac markers at this time.  Follow-up with high sensitive troponin level.  Physician if any abnormal findings.  #7.  Diarrhea, present on admission: We will consider gentle fluids if unable to tolerate p.o. intake.  If diarrhea persists, patient may need to be considered for stool work-up.  #8.  DVT prophylaxis: Patient already on apixaban.  #9.  Mild hyponatremia:  CODE STATUS full code.  Patient remains high risk due to multiple medical comorbidities.   [place],  MD 07/01/2019, 12:59 AM

## 2019-07-01 NOTE — ED Notes (Signed)
Pt standing at bedside with urinal.

## 2019-07-01 NOTE — ED Notes (Signed)
Pt provided with crackers, water and grape juice by pt's request.

## 2019-07-01 NOTE — Progress Notes (Signed)
Briefly, patient is a 59 year old man with multiple medical problems including status post renal transplantation 8 years ago presently on Prograf, HFpEF, atrial fibrillation on Eliquis, CAD status post CABG in 2005 denies MI and pulmonary artery hypertension who was admitted earlier this morning for malaise, fatigue, loss of taste and smell and shortness of breath.  Patient states he lost his mother to Covid earlier this year.  He had his first Covid vaccine 3 weeks previously.  Chest x-ray was notable for bilateral infiltrates suggestive of CHF versus pneumonia.  Patient himself tells me he is sure this is not CHF as he is 6 pounds under his dry weight which is 153 pounds.  Patient weighed himself earlier and he was 146.  Given procalcitonin of 0.54 patient was started on ceftriaxone which was continued. Patient's Covid test has now come back positive. Patient has been started on dexamethasone and remdesivir day #1 CRP is 17.  When I went to see the patient he appeared comfortable and was sitting up in bed in no acute distress. His biggest concern was anxiety about how he would do as well as grief at the loss of his mother. He was able to speak in full sentences without difficulty, no accessory muscle use mild tachypnea.

## 2019-07-01 NOTE — ED Notes (Signed)
Pt able to ambulatory independently to toilet to have a BM. Pt steady on his feet. Pt placed back on bed safetly with monitor in placed.

## 2019-07-01 NOTE — ED Notes (Signed)
Pt provided with dinner tray and water by this RN

## 2019-07-01 NOTE — ED Notes (Signed)
Pt provided with a breakfast tray.

## 2019-07-01 NOTE — ED Notes (Signed)
Pt provided with a lunch tray at this time.

## 2019-07-02 LAB — CBC WITH DIFFERENTIAL/PLATELET
Abs Immature Granulocytes: 0.01 10*3/uL (ref 0.00–0.07)
Basophils Absolute: 0 10*3/uL (ref 0.0–0.1)
Basophils Relative: 1 %
Eosinophils Absolute: 0 10*3/uL (ref 0.0–0.5)
Eosinophils Relative: 1 %
HCT: 33.3 % — ABNORMAL LOW (ref 39.0–52.0)
Hemoglobin: 10.2 g/dL — ABNORMAL LOW (ref 13.0–17.0)
Immature Granulocytes: 0 %
Lymphocytes Relative: 26 %
Lymphs Abs: 0.9 10*3/uL (ref 0.7–4.0)
MCH: 24.5 pg — ABNORMAL LOW (ref 26.0–34.0)
MCHC: 30.6 g/dL (ref 30.0–36.0)
MCV: 80 fL (ref 80.0–100.0)
Monocytes Absolute: 0.4 10*3/uL (ref 0.1–1.0)
Monocytes Relative: 11 %
Neutro Abs: 2.1 10*3/uL (ref 1.7–7.7)
Neutrophils Relative %: 61 %
Platelets: 100 10*3/uL — ABNORMAL LOW (ref 150–400)
RBC: 4.16 MIL/uL — ABNORMAL LOW (ref 4.22–5.81)
RDW: 20.4 % — ABNORMAL HIGH (ref 11.5–15.5)
WBC: 3.4 10*3/uL — ABNORMAL LOW (ref 4.0–10.5)
nRBC: 0 % (ref 0.0–0.2)

## 2019-07-02 LAB — COMPREHENSIVE METABOLIC PANEL
ALT: 10 U/L (ref 0–44)
AST: 17 U/L (ref 15–41)
Albumin: 2.7 g/dL — ABNORMAL LOW (ref 3.5–5.0)
Alkaline Phosphatase: 86 U/L (ref 38–126)
Anion gap: 8 (ref 5–15)
BUN: 55 mg/dL — ABNORMAL HIGH (ref 6–20)
CO2: 23 mmol/L (ref 22–32)
Calcium: 8.4 mg/dL — ABNORMAL LOW (ref 8.9–10.3)
Chloride: 104 mmol/L (ref 98–111)
Creatinine, Ser: 1.67 mg/dL — ABNORMAL HIGH (ref 0.61–1.24)
GFR calc Af Amer: 51 mL/min — ABNORMAL LOW (ref >60)
GFR calc non Af Amer: 44 mL/min — ABNORMAL LOW (ref >60)
Glucose, Bld: 112 mg/dL — ABNORMAL HIGH (ref 70–99)
Potassium: 4.1 mmol/L (ref 3.5–5.1)
Sodium: 135 mmol/L (ref 135–145)
Total Bilirubin: 0.8 mg/dL (ref 0.3–1.2)
Total Protein: 6.4 g/dL — ABNORMAL LOW (ref 6.5–8.1)

## 2019-07-02 LAB — C-REACTIVE PROTEIN: CRP: 16.8 mg/dL — ABNORMAL HIGH (ref <1.0)

## 2019-07-02 LAB — HEMOGLOBIN A1C
Hgb A1c MFr Bld: 10.3 % — ABNORMAL HIGH (ref 4.8–5.6)
Mean Plasma Glucose: 249 mg/dL

## 2019-07-02 LAB — GLUCOSE, CAPILLARY
Glucose-Capillary: 127 mg/dL — ABNORMAL HIGH (ref 70–99)
Glucose-Capillary: 137 mg/dL — ABNORMAL HIGH (ref 70–99)
Glucose-Capillary: 309 mg/dL — ABNORMAL HIGH (ref 70–99)
Glucose-Capillary: 404 mg/dL — ABNORMAL HIGH (ref 70–99)

## 2019-07-02 LAB — MAGNESIUM: Magnesium: 2.4 mg/dL (ref 1.7–2.4)

## 2019-07-02 LAB — URINE CULTURE: Culture: <10000 — AB

## 2019-07-02 LAB — PHOSPHORUS: Phosphorus: 3.6 mg/dL (ref 2.5–4.6)

## 2019-07-02 LAB — FIBRIN DERIVATIVES D-DIMER (ARMC ONLY): Fibrin derivatives D-dimer (ARMC): 1170.5 ng/mL (FEU) — ABNORMAL HIGH (ref 0.00–499.00)

## 2019-07-02 MED ORDER — MELATONIN 5 MG PO TABS
5.0000 mg | ORAL_TABLET | Freq: Every day | ORAL | Status: DC
  Administered 2019-07-02 – 2019-07-03 (×3): 5 mg via ORAL
  Filled 2019-07-02 (×4): qty 1

## 2019-07-02 MED ORDER — INSULIN ASPART 100 UNIT/ML ~~LOC~~ SOLN
0.0000 [IU] | Freq: Three times a day (TID) | SUBCUTANEOUS | Status: DC
  Administered 2019-07-02 – 2019-07-03 (×2): 15 [IU] via SUBCUTANEOUS
  Administered 2019-07-03 (×2): 5 [IU] via SUBCUTANEOUS
  Administered 2019-07-03: 18:00:00 15 [IU] via SUBCUTANEOUS
  Administered 2019-07-04: 5 [IU] via SUBCUTANEOUS
  Filled 2019-07-02 (×6): qty 1

## 2019-07-02 MED ORDER — SPIRONOLACTONE 25 MG PO TABS
25.0000 mg | ORAL_TABLET | Freq: Every day | ORAL | Status: DC
  Administered 2019-07-03: 09:00:00 25 mg via ORAL
  Filled 2019-07-02: qty 1

## 2019-07-02 NOTE — Progress Notes (Signed)
PROGRESS NOTE    [name].  EOF:[id number]  DOB: 27-Jan-1961  DOA: 06/30/2019 PCP: [name], MD Outpatient Specialists:   Hospital course:  Briefly, patient is a 59 year old man with multiple medical problems including status post renal transplantation 8 years ago presently on Prograf, HFpEF, atrial fibrillation on Eliquis, CAD status post CABG in 2005 denies MI and pulmonary artery hypertension who was admitted 07/01/2019 for malaise, fatigue, loss of taste and smell and shortness of breath.  Patient had received first Covid vaccine 3 weeks previously, he had lost his mother to Covid earlier this year.  Chest x-ray was notable for bilateral infiltrates suggestive of CHF versus pneumonia.  Patient himself told  me he is sure this is not CHF as he is 6 pounds under his dry weight of 153 pounds.  Initial procalcitonin was 0.54, patient was started on ceftriaxone.  Initial CRP was 17.   Subjective:  Patient states he feels much better than he did yesterday.  Has significantly less malaise and myalgias.  His appetite is returning a little bit too.  Notes his breathing is fine as long as he is on oxygen.  Has not tried to get up out of bed.   Objective: Vitals:   07/01/19 2153 07/01/19 2155 07/01/19 2200 07/02/19 0800  BP:  (!) 126/57 (!) 124/49 123/64  Pulse:  73 (!) 57 (!) 58  Resp:  (!) _0 Temp:  98.8 F (37.1 C)  98.3 F (36.8 C)  TempSrc:  Oral  Oral  SpO2:  96%  98%  Weight: 118.1 kg     Height: _1  (1.803 m)       Intake/Output Summary (Last 24 hours) at 07/02/2019 1106 Last data filed at 07/02/2019 0830 Gross per 24 hour  Intake 602.66 ml  Output 1050 ml  Net -447.34 ml   Filed Weights   06/30/19 1957 07/01/19 2153  Weight: 112 kg 118.1 kg     Exam:  General: Tired appearing man sitting up in bed in no acute distress, looking brighter than yesterday but still quite tired. Eyes: sclera anicteric, conjuctiva mild injection  bilaterally CVS: S1-S2, regular  Respiratory:  decreased air entry bilaterally secondary to body habitus.  No rales.   GI: NABS, soft, NT  LE: No edema.  Neuro: A/O x 3, Moving all extremities equally with normal strength, CN 3-12 intact, grossly nonfocal.  Psych: patient is logical and coherent, judgement and insight appear normal, mood and affect appropriate to situation.   Data Reviewed: Assessment & Plan:   59 year old man with known combined CHF, status post renal transplant on tacrolimus, CAD and atrial fibrillation was admitted 07/01/19 with hypoxia and bilateral fluffy infiltrates.  Covid PCR is positive.  Pneumonia thought to be secondary to SARS-CoV-2 infection, however procalcitonin was 0.54 so patient is also on ceftriaxone.  Pneumonia likely secondary to COVID-19 versus CAP Patient was admitted with shortness of breath and bilateral fluffy infiltrates on chest x-ray suggestive of CHF. Patient himself is very clear that this is not CHF as he is 6 pounds below his dry weight. Given procalcitonin of 0.54, patient was also started on ceftriaxone. Patient feels improved today on present course of treatment. Continue ceftriaxone day #2. Continue remdesivir 2/5 Continue steroids 2/10  CHF Patient follows his dry weight very carefully and knows how to tailor his diuretics to weight. Patient wants to weigh himself today so he can follow whether he is gaining weight while in house. Patient  is eating and drinking reasonably well, not on any IV fluids Metolazone and torsemide are continued  Status post renal transplant/CKD 3A Continue Prograf/tacrolimus Pressure is within normal limits, will continue present meds Creatinine is 1.7 today which patient states is his baseline.  HTN Continue amlodipine and lisinopril with good control of blood pressure  Atrial fibrillation Patient does not appear to be on any rate control medications per med rec however heart rate is low  normal. Continue Eliquis  CAD Patient has remote history of CABG, denies history of MI in past Patient does not appear to be on a beta-blocker on admission, possibly secondary to baseline bradycardia I do not see any antiplatelet agents on med rec, this can be addressed as an outpatient as warranted.  DM 2 On admission patient was placed on glargine 10 and Farxiga was continued Blood sugars under good control without hypoglycemia, continue present regimen  Pulmonary hypertension Continue tadalafil  Depression Continue Wellbutrin  BPH Continue Flomax   DVT prophylaxis: On Eliquis Code Status: Full Family Communication: Patient states family knows he is here Disposition Plan:   Patient is from: Home  Anticipated Discharge Location: Home  Barriers to Discharge: Ongoing acute Covid pneumonia  Is patient medically stable for Discharge: No   Consultants:  None  Procedures:  None  Antimicrobials:  Ceftriaxone started on 03/25/1733   Basic Metabolic Panel: Recent Labs  Lab 06/30/19 2006 07/01/19 0309 07/01/19 1754 07/02/19 0657  NA 134* 137 135 135  K 4.5 4.3 4.1 4.1  CL 103 106 104 104  CO2 _0 GLUCOSE 149* 174* 231* 112*  BUN 47* 48* 52* 55*  CREATININE 1.78* 1.88* 1.90* 1.67*  CALCIUM 8.5* 8.2* 8.4* 8.4*  MG  --  2.1  --  2.4  PHOS  --  3.8  --  3.6   Liver Function Tests: Recent Labs  Lab 06/30/19 2006 07/01/19 0309 07/01/19 1754 07/02/19 0657  AST _1 ALT _2 ALKPHOS 98 84 87 86  BILITOT 1.2 1.2 0.8 0.8  PROT 7.4 6.6 6.7 6.4*  ALBUMIN 3.0* 2.7* 2.8* 2.7*   No results for input(s): LIPASE, AMYLASE in the last 168 hours. No results for input(s): AMMONIA in the last 168 hours. CBC: Recent Labs  Lab 06/30/19 2006 07/01/19 0309 07/01/19 1754 07/02/19 0657  WBC 4.8 4.4 3.6* 3.4*  NEUTROABS 3.4  --   --  2.1  HGB 11.4* 10.6* 10.7* 10.2*  HCT 36.1* 34.3* 34.0* 33.3*  MCV 78.1* 80.1 78.5* 80.0  PLT 98* 97* 99*  100*   Cardiac Enzymes: No results for input(s): CKTOTAL, CKMB, CKMBINDEX, TROPONINI in the last 168 hours. BNP (last 3 results) No results for input(s): PROBNP in the last 8760 hours. CBG: Recent Labs  Lab 07/01/19 1001 07/01/19 1326 07/01/19 1744 07/01/19 2215 07/02/19 0836  GLUCAP 146* 154* 180* 107* 127*    Recent Results (from the past 240 hour(s))  Culture, blood (Routine x 2)     Status: None (Preliminary result)   Collection Time: 06/30/19  8:07 PM   Specimen: BLOOD  Result Value Ref Range Status   Specimen Description BLOOD BLOOD RIGHT FOREARM  Final   Special Requests   Final    BOTTLES DRAWN AEROBIC AND ANAEROBIC Blood Culture results may not be optimal due to an excessive volume of blood received in culture bottles Performed at [facility], [address] [place], [place] [address]    Culture  GRAM POSITIVE COCCI  Final   Report Status PENDING  Incomplete  Culture, blood (Routine x 2)     Status: None (Preliminary result)   Collection Time: 06/30/19  8:07 PM   Specimen: BLOOD  Result Value Ref Range Status   Specimen Description BLOOD BLOOD RIGHT WRIST  Final   Special Requests   Final    BOTTLES DRAWN AEROBIC AND ANAEROBIC Blood Culture adequate volume   Culture   Final    NO GROWTH < 12 HOURS Performed at [facility], [address] [place], [place] [address]    Report Status PENDING  Incomplete  Blood Culture ID Panel (Reflexed)     Status: Abnormal   Collection Time: 06/30/19  8:07 PM  Result Value Ref Range Status   Enterococcus species NOT DETECTED NOT DETECTED Final   Listeria monocytogenes NOT DETECTED NOT DETECTED Final   Staphylococcus species DETECTED (A) NOT DETECTED Final    Comment: Methicillin (oxacillin) susceptible coagulase negative staphylococcus. Possible blood culture contaminant (unless isolated from more than one blood culture draw or clinical case suggests pathogenicity). No antibiotic treatment is indicated for  blood  culture contaminants. CRITICAL RESULT CALLED TO, READ BACK BY AND VERIFIED WITH: CHRISTINE KATSOUDAS 07/01/19 @ 3559  [place]    Staphylococcus aureus (BCID) NOT DETECTED NOT DETECTED Final   Methicillin resistance NOT DETECTED NOT DETECTED Final   Streptococcus species NOT DETECTED NOT DETECTED Final   Streptococcus agalactiae NOT DETECTED NOT DETECTED Final   Streptococcus pneumoniae NOT DETECTED NOT DETECTED Final   Streptococcus pyogenes NOT DETECTED NOT DETECTED Final   Acinetobacter baumannii NOT DETECTED NOT DETECTED Final   Enterobacteriaceae species NOT DETECTED NOT DETECTED Final   Enterobacter cloacae complex NOT DETECTED NOT DETECTED Final   Escherichia coli NOT DETECTED NOT DETECTED Final   Klebsiella oxytoca NOT DETECTED NOT DETECTED Final   Klebsiella pneumoniae NOT DETECTED NOT DETECTED Final   Proteus species NOT DETECTED NOT DETECTED Final   Serratia marcescens NOT DETECTED NOT DETECTED Final   Haemophilus influenzae NOT DETECTED NOT DETECTED Final   Neisseria meningitidis NOT DETECTED NOT DETECTED Final   Pseudomonas aeruginosa NOT DETECTED NOT DETECTED Final   Candida albicans NOT DETECTED NOT DETECTED Final   Candida glabrata NOT DETECTED NOT DETECTED Final   Candida krusei NOT DETECTED NOT DETECTED Final   Candida parapsilosis NOT DETECTED NOT DETECTED Final   Candida tropicalis NOT DETECTED NOT DETECTED Final    Comment: Performed at [facility], [address] [place], [place] [address]  Respiratory Panel by RT PCR (Flu A&B, Covid) - Nasopharyngeal Swab     Status: Abnormal   Collection Time: 06/30/19 11:56 PM   Specimen: Nasopharyngeal Swab  Result Value Ref Range Status   SARS Coronavirus 2 by RT PCR POSITIVE (A) NEGATIVE Final    Comment: RESULT CALLED TO, READ BACK BY AND VERIFIED WITH: Verneda Skill RN 0120 07/01/19 HNM (NOTE) SARS-CoV-2 target nucleic acids are DETECTED. SARS-CoV-2 RNA is generally detectable in upper respiratory specimens   during the acute phase of infection. Positive results are indicative of the presence of the identified virus, but do not rule out bacterial infection or co-infection with other pathogens not detected by the test. Clinical correlation with patient history and other diagnostic information is necessary to determine patient infection status. The expected result is Negative. Fact Sheet for Patients:  PinkCheek.be Fact Sheet for Healthcare Providers: GravelBags.it This test is not yet approved or cleared by the [place] and  has been authorized for detection and/or diagnosis of SARS-CoV-2 by FDA under an Emergency Use Authorization (EUA).  This EUA will remain in effect (meaning this test can be used) for  the duration of  the COVID-19 declaration under Section 564(b)(1) of the Act, 21 U.S.C. section 360bbb-3(b)(1), unless the authorization is terminated or revoked sooner.    Influenza A by PCR NEGATIVE NEGATIVE Final   Influenza B by PCR NEGATIVE NEGATIVE Final    Comment: (NOTE) The Xpert Xpress SARS-CoV-2/FLU/RSV assay is intended as an aid in  the diagnosis of influenza from Nasopharyngeal swab specimens and  should not be used as a sole basis for treatment. Nasal washings and  aspirates are unacceptable for Xpert Xpress SARS-CoV-2/FLU/RSV  testing. Fact Sheet for Patients: PinkCheek.be Fact Sheet for Healthcare Providers: GravelBags.it This test is not yet approved or cleared by the [place] FDA and  has been authorized for detection and/or diagnosis of SARS-CoV-2 by  FDA under an Emergency Use Authorization (EUA). This EUA will remain  in effect (meaning this test can be used) for the duration of the  Covid-19 declaration under Section 564(b)(1) of the Act, 21  U.S.C. section 360bbb-3(b)(1), unless the authorization is  terminated or revoked. Performed  at [facility], [address] [place], [name] [address]   Urine culture     Status: Abnormal   Collection Time: 07/01/19  1:29 AM   Specimen: Urine, Random  Result Value Ref Range Status   Specimen Description   Final    URINE, RANDOM Performed at [facility], [address] [place], [place] [address]    Special Requests   Final    NONE Performed at [facility], [address] [place], [place] [address]    Culture (A)  Final    <10,000 COLONIES/mL INSIGNIFICANT GROWTH Performed at [facility], [place] [address] [place], [name] [address]    Report Status 07/02/2019 FINAL  Final  MRSA PCR Screening     Status: None   Collection Time: 07/01/19  5:54 PM   Specimen: Nasal Mucosa; Nasopharyngeal  Result Value Ref Range Status   MRSA by PCR NEGATIVE NEGATIVE Final    Comment:        The GeneXpert MRSA Assay (FDA approved for NASAL specimens only), is one component of a comprehensive MRSA colonization surveillance program. It is not intended to diagnose MRSA infection nor to guide or monitor treatment for MRSA infections. Performed at [facility], [name] [place], [place] [address]   Culture, sputum-assessment     Status: None   Collection Time: 07/01/19  5:54 PM   Specimen: Sputum  Result Value Ref Range Status   Specimen Description SPUTUM  Final   Special Requests NONE  Final   Sputum evaluation   Final    THIS SPECIMEN IS ACCEPTABLE FOR SPUTUM CULTURE Performed at [facility], [address], [place],  [address]    Report Status 07/01/2019 FINAL  Final      Studies: CT CHEST WO CONTRAST  Result Date: 07/01/2019 CLINICAL DATA:  Hypoxia.  COVID-19 positive EXAM: CT CHEST WITHOUT CONTRAST TECHNIQUE: Multidetector CT imaging of the chest was performed following the standard protocol without IV contrast. COMPARISON:  January 16, 2019 chest CT; chest radiograph June 30, 2018 FINDINGS:  Cardiovascular: There is no thoracic aortic aneurysm. There are scattered foci of aortic atherosclerosis as well as scattered foci of great vessel calcification. Patient is status post coronary artery bypass  grafting. There is extensive native coronary artery calcification. There is cardiomegaly with calcification in the mitral valve. There is no pericardial effusion or pericardial thickening. There is prominence of the main pulmonary outflow tract measuring 3.5 cm. Mediastinum/Nodes: Visualized thyroid appears normal. There are multiple calcified lymph nodes consistent with prior granulomatous disease. A right paratracheal lymph node measures 3.1 x 2.8 cm with diffuse calcification, consistent with granulomatous disease change. Other smaller calcified lymph nodes noted as well. There are multiple noncalcified subcentimeter in size lymph nodes. No noncalcified lymph nodes present meeting size criteria for pathologic significance. Small hiatal hernia present. Lungs/Pleura: There is extensive ground-glass type opacity throughout the lungs bilaterally with involvement to varying degrees of most lobes in segments. Both upper and lower lobe areas of ground-glass type opacity noted. There is mild consolidation in the posterior segment right upper lobe. No other appreciable consolidation noted. No appreciable pleural effusions. Upper Abdomen: In the visualized upper abdomen, there is evidence of previous gastric surgery without wall thickening. Incomplete visualization of spleen. Spleen measures 17.9 cm from anterior to posterior dimension, enlarged. There are multiple foci of arterial vascular calcification in the upper abdominal aorta and multiple mesenteric arteries. Musculoskeletal: Status post median sternotomy. Multiple foci of degenerative change noted in the lower thoracic spine. No blastic or lytic bone lesions. No evident chest wall lesions. IMPRESSION: 1. Multifocal pneumonia, likely of atypical organism  etiology. Mild consolidation posterior segment right upper lobe. Most areas of opacification have a ground-glass type opacity appearance. 2. Evidence of prior granulomatous disease. Enlarged right paratracheal lymph node is calcified consistent with prior granulomatous disease. No frank adenopathy by size criteria involving noncalcified lymph nodes. 3. Aortic atherosclerosis. Multiple foci of native coronary artery calcification with evidence of previous coronary artery bypass grafting. Foci of aortic atherosclerosis as well as calcification in great vessels and multiple mesenteric arterial vessels noted. 4. Enlargement of the main pulmonary outflow tract is indicative of a degree of pulmonary arterial hypertension. 5.  Cardiomegaly.  Calcification of mitral valve. 6. Apparent splenomegaly with spleen incompletely visualized on this study. Aortic Atherosclerosis (ICD10-I70.0). Electronically Signed   By: Lowella Grip III M.D.   On: 07/01/2019 11:23   DG Chest Port 1 View  Result Date: 06/30/2019 CLINICAL DATA:  59 year old male with cough and congestion. EXAM: PORTABLE CHEST 1 VIEW COMPARISON:  Chest radiograph dated 05/04/2019. FINDINGS: There is cardiomegaly with vascular congestion and probable edema. Diffuse interstitial nodularity may be related to vascular congestion although developing infiltrate is not excluded clinical correlation is recommended. There is no pleural effusion or pneumothorax. Calcified right paratracheal granuloma. Median sternotomy wires and CABG vascular clips. No acute osseous pathology. IMPRESSION: Cardiomegaly with findings of CHF. Pneumonia is not excluded. Electronically Signed   By: Anner Crete M.D.   On: 06/30/2019 22:34     Scheduled Meds:  amLODipine  5 mg Oral Daily   apixaban  5 mg Oral BID   buPROPion  150 mg Oral BID   colchicine  0.6 mg Oral Daily   dexamethasone  6 mg Oral Daily   fluticasone  2 spray Each Nare Daily   folic acid  1 mg Oral  Daily   gabapentin  300 mg Oral QHS   guaiFENesin  600 mg Oral BID   insulin aspart  0-9 Units Subcutaneous TID WC   insulin glargine  10 Units Subcutaneous QHS   lisinopril  20 mg Oral Daily   melatonin  5 mg Oral QHS   montelukast  10 mg  Oral QHS   multivitamin with minerals  1 tablet Oral Daily   multivitamin with minerals  1 tablet Oral Daily   mycophenolate  360 mg Oral BID   pantoprazole  40 mg Oral Daily   potassium chloride  10 mEq Oral Daily   rosuvastatin  10 mg Oral Daily   sodium chloride flush  3 mL Intravenous Q12H   tacrolimus  1 mg Oral BID   tadalafil (PAH)  40 mg Oral Daily   tamsulosin  0.4 mg Oral Daily   thiamine  100 mg Oral Daily   torsemide  20 mg Oral BID   Continuous Infusions:  cefTRIAXone (ROCEPHIN)  IV 2 g (07/02/19 0944)   doxycycline (VIBRAMYCIN) IV 100 mg (07/02/19 0416)   remdesivir 100 mg in NS 100 mL 100 mg (07/02/19 0937)    Principal Problem:   Pneumonia due to COVID-19 virus Active Problems:   Pneumonia   Atypical pneumonia     Phuong Moffatt Derek Jack, [facility]  If 7PM-7AM, please contact night-coverage [email or web address] Password [email or web address] 07/02/2019, 11:06 AM    LOS: 1 day

## 2019-07-02 NOTE — Progress Notes (Signed)
Call received from CCMD re: pt HR dipping into the 30s, hitting a low of 37. Pt BP stable 134/59,. Asymptomatic. Hospitalist made aware, no new orders. Will CTM.

## 2019-07-03 DIAGNOSIS — I2510 Atherosclerotic heart disease of native coronary artery without angina pectoris: Secondary | ICD-10-CM

## 2019-07-03 DIAGNOSIS — I4821 Permanent atrial fibrillation: Secondary | ICD-10-CM

## 2019-07-03 DIAGNOSIS — R001 Bradycardia, unspecified: Secondary | ICD-10-CM

## 2019-07-03 LAB — COMPREHENSIVE METABOLIC PANEL
ALT: 10 U/L (ref 0–44)
AST: 14 U/L — ABNORMAL LOW (ref 15–41)
Albumin: 2.6 g/dL — ABNORMAL LOW (ref 3.5–5.0)
Alkaline Phosphatase: 91 U/L (ref 38–126)
Anion gap: 7 (ref 5–15)
BUN: 70 mg/dL — ABNORMAL HIGH (ref 6–20)
CO2: 22 mmol/L (ref 22–32)
Calcium: 8.6 mg/dL — ABNORMAL LOW (ref 8.9–10.3)
Chloride: 105 mmol/L (ref 98–111)
Creatinine, Ser: 1.92 mg/dL — ABNORMAL HIGH (ref 0.61–1.24)
GFR calc Af Amer: 43 mL/min — ABNORMAL LOW (ref >60)
GFR calc non Af Amer: 37 mL/min — ABNORMAL LOW (ref >60)
Glucose, Bld: 232 mg/dL — ABNORMAL HIGH (ref 70–99)
Potassium: 4.5 mmol/L (ref 3.5–5.1)
Sodium: 134 mmol/L — ABNORMAL LOW (ref 135–145)
Total Bilirubin: 0.6 mg/dL (ref 0.3–1.2)
Total Protein: 6.7 g/dL (ref 6.5–8.1)

## 2019-07-03 LAB — CBC WITH DIFFERENTIAL/PLATELET
Abs Immature Granulocytes: 0.02 10*3/uL (ref 0.00–0.07)
Basophils Absolute: 0 10*3/uL (ref 0.0–0.1)
Basophils Relative: 0 %
Eosinophils Absolute: 0 10*3/uL (ref 0.0–0.5)
Eosinophils Relative: 0 %
HCT: 35.2 % — ABNORMAL LOW (ref 39.0–52.0)
Hemoglobin: 10.9 g/dL — ABNORMAL LOW (ref 13.0–17.0)
Immature Granulocytes: 1 %
Lymphocytes Relative: 23 %
Lymphs Abs: 0.6 10*3/uL — ABNORMAL LOW (ref 0.7–4.0)
MCH: 24.2 pg — ABNORMAL LOW (ref 26.0–34.0)
MCHC: 31 g/dL (ref 30.0–36.0)
MCV: 78 fL — ABNORMAL LOW (ref 80.0–100.0)
Monocytes Absolute: 0.2 10*3/uL (ref 0.1–1.0)
Monocytes Relative: 7 %
Neutro Abs: 1.8 10*3/uL (ref 1.7–7.7)
Neutrophils Relative %: 69 %
Platelets: 122 10*3/uL — ABNORMAL LOW (ref 150–400)
RBC: 4.51 MIL/uL (ref 4.22–5.81)
RDW: 20 % — ABNORMAL HIGH (ref 11.5–15.5)
WBC: 2.6 10*3/uL — ABNORMAL LOW (ref 4.0–10.5)
nRBC: 0 % (ref 0.0–0.2)

## 2019-07-03 LAB — PHOSPHORUS: Phosphorus: 3.9 mg/dL (ref 2.5–4.6)

## 2019-07-03 LAB — GLUCOSE, CAPILLARY
Glucose-Capillary: 210 mg/dL — ABNORMAL HIGH (ref 70–99)
Glucose-Capillary: 244 mg/dL — ABNORMAL HIGH (ref 70–99)
Glucose-Capillary: 372 mg/dL — ABNORMAL HIGH (ref 70–99)
Glucose-Capillary: 368 mg/dL — ABNORMAL HIGH (ref 70–99)

## 2019-07-03 LAB — CULTURE, BLOOD (ROUTINE X 2)

## 2019-07-03 LAB — C-REACTIVE PROTEIN: CRP: 12.9 mg/dL — ABNORMAL HIGH (ref <1.0)

## 2019-07-03 LAB — FIBRIN DERIVATIVES D-DIMER (ARMC ONLY): Fibrin derivatives D-dimer (ARMC): 1040.21 ng/mL (FEU) — ABNORMAL HIGH (ref 0.00–499.00)

## 2019-07-03 LAB — MAGNESIUM: Magnesium: 2.4 mg/dL (ref 1.7–2.4)

## 2019-07-03 MED ORDER — SPIRONOLACTONE 25 MG PO TABS
12.5000 mg | ORAL_TABLET | Freq: Every day | ORAL | Status: DC
  Filled 2019-07-03: qty 0.5

## 2019-07-03 MED ORDER — LISINOPRIL 5 MG PO TABS: 5.0000 mg | ORAL_TABLET | Freq: Every day | ORAL | Status: DC

## 2019-07-03 NOTE — Consult Note (Signed)
Cardiology Consultation:   Patient ID: [name]. MRN: [id number]; DOB: [birth year]  Admit date: 06/30/2019 Date of Consult: 07/03/2019  Primary Care Provider: [name], MD Primary Cardiologist: [name], MD  Primary Electrophysiologist:  None    Patient Profile:   [name]. is a 59 y.o. male with a hx of CAD status post CABG, ischemic cardiomyopathy last EF45-50%, chronic A. fib on Eliquis, end-stage renal disease who is being seen today for the evaluation of bradycardia at the request of Dr. Manuella Ghazi.  History of Present Illness:   [name] is a 59 year old male with history of CAD status post CABG 2015, ischemic cardiomyopathy last EF 45-50% on 03/2018, chronic A. fib on Eliquis, end-stage renal disease status post renal transplant in 2012, diabetes, hypertension who presents to the hospital due to malaise.  Patient states feeling fatigue, loss of smell, loss of taste about a week ago.  Symptoms persisted and 3 days ago is noticed productive cough with greenish and blood-tinged sputum.  He presented to the emergency room 2 days ago due to worsening symptoms.  He was due for his second dose of Covid vaccine a day before arrival but did not get vaccine due to his illness.  On admit, COVID-19 PCR was positive.  Chest x-ray showed pulmonary infiltrates.  Patient was started on antibiotics and treated for COVID-19 pneumonia and also community-acquired pneumonia.  He is vitals today showed a heart rate of 40 bpm.  Upon my exam, patient states feeling much better after taking a shower.  Of note, patient states his blood pressure had been running low starting 2 weeks ago and was planning on seeing primary cardiologist [name] in the office for titration of blood pressure medications.  He has not taken his Lasix for the past week or so due to being below his ideal weight.   Past Medical History:  Diagnosis Date  . Allergy    seasonal  . Amnestic MCI (mild  cognitive impairment with memory loss)   . Asthma   . Benign neoplasm of colon   . CAD (coronary artery disease) 2005   a.  Status post four-vessel CABG in 2005; b. 03/2018 MV: Small, fixed inferoapical and apical septal defect. No ischemia. EF 47% (60-65% by 05/2018 Echo).  . Chronic atrial fibrillation ([place])    a. CHADS2VASc 4 (CHF, HTN, DM, vascular disease)--> Eliquis; b. 09/2018 Zio: Chronic Afib.  . Chronic combined systolic and diastolic CHF (congestive heart failure) ([place])    a.  7/18 Echo: EF 45-50%; b. 05/2018 Echo: EF 60-65%, RVSP 74.8; c. 12/2018 Echo: EF 50-55%. Sev dil LA.  Marland Kitchen Chronic venous stasis dermatitis of both lower extremities   . Cytomegaloviral disease ([place]) 2017  . Depression   . Diabetes mellitus   . Diverticulosis   . Dyspnea   . Erectile dysfunction   . ESRD (end stage renal disease) ([place]) 2005   a. HD 2004-2012; b. S/p renal transplant in 2012  . GERD (gastroesophageal reflux disease)   . Gout   . Hyperlipidemia    low since renal failure  . Hypertension   . Kidney transplant status, cadaveric 2012  . Obesity   . PAH (pulmonary artery hypertension) ([place])    a. 05/2018 Echo: RVSP 74.40mHg; b. 12/2018 RV not well visualized.  . Pneumonia   . [place]   . Sleep apnea    bipap with oxygen 2 l  . Trigger finger   . Ulcer 05/2016  Left shin  . Wears glasses     Past Surgical History:  Procedure Laterality Date  . BARIATRIC SURGERY    . CARDIAC CATHETERIZATION     [facility]  . CARDIAC CATHETERIZATION  05/03/2019  . CATARACT EXTRACTION W/PHACO Right 10/29/2017   Procedure: CATARACT EXTRACTION PHACO AND INTRAOCULAR LENS PLACEMENT ([place])  RIGHT DIABETIC;  Surgeon: [name], MD;  Location: [facility];  Service: Ophthalmology;  Laterality: Right;  Diabetic - insulin  . CATARACT EXTRACTION W/PHACO Left 11/18/2017   Procedure: CATARACT EXTRACTION PHACO AND INTRAOCULAR LENS PLACEMENT ([place]) LEFT IVA/TOPICAL;  Surgeon: [name], MD;   Location: [place];  Service: Ophthalmology;  Laterality: Left;  DIABETES - insulin sleep apnea  . COLONOSCOPY WITH PROPOFOL N/A 04/22/2013   Procedure: COLONOSCOPY WITH PROPOFOL;  Surgeon: [name], MD;  [facility];  Service: Endoscopy;  Laterality: N/A;  . CORONARY ARTERY BYPASS GRAFT  2005   X 4  . DG ANGIO AV SHUNT*L*     right and left upper arms  . FASCIOTOMY  03/03/2012   Procedure: FASCIOTOMY;  Surgeon: [name], MD;  Location: [place];  Service: Orthopedics;  Laterality: Right;  FASCIOTOMY RIGHT SMALL FINGER  . FASCIOTOMY Left 08/17/2013   Procedure: FASCIOTOMY LEFT RING;  Surgeon: [name], MD;  Location: [place];  Service: Orthopedics;  Laterality: Left;  . INCISION AND DRAINAGE ABSCESS Left 10/15/2015   Procedure: INCISION AND DRAINAGE ABSCESS;  Surgeon: [name], MD;  Location: [facility];  Service: General;  Laterality: Left;  . KIDNEY TRANSPLANT  09/13/2010   cadaver--at [facility]  . RIGHT HEART CATH N/A 11/15/2016   Procedure: RIGHT HEART CATH;  Surgeon: [name], MD;  Location: [place] [facility];  Service: Cardiovascular;  Laterality: N/A;  . RIGHT HEART CATH N/A 05/03/2019   Procedure: RIGHT HEART CATH;  Surgeon: [name], MD;  Location: [place] [facility];  Service: Cardiovascular;  Laterality: N/A;  . TYMPANIC MEMBRANE REPAIR  1/12   left  . VASECTOMY       Home Medications:  Prior to Admission medications   Medication Sig Start Date End Date Taking? Authorizing Provider  amLODipine (NORVASC) 5 MG tablet Take 1 tablet (5 mg total) by mouth daily. 06/10/19  Yes [name], MD  apixaban (ELIQUIS) 5 MG TABS tablet Take 1 tablet (5 mg total) by mouth 2 (two) times daily. 04/05/19  Yes [name], MD  beclomethasone (QVAR) 80 MCG/ACT inhaler Inhale 2 puffs into the lungs 2 (two) times daily. 10/01/13  Yes [provider]  buPROPion (WELLBUTRIN SR) 150 MG 12 hr  tablet TAKE 1 TABLET BY MOUTH TWICE DAILY Patient taking differently: Take 150 mg by mouth 2 (two) times daily.  03/30/19  Yes [name] I, MD  colchicine 0.6 MG tablet Take 1 tablet (0.6 mg total) by mouth daily. Patient taking differently: Take 0.6 mg by mouth daily as needed (gout flare).  05/07/19  Yes Clegg, Amy D, NP  dapagliflozin propanediol (FARXIGA) 10 MG TABS tablet Take 10 mg by mouth daily before breakfast. 05/07/19  Yes Clegg, Amy D, NP  ferrous sulfate 325 (65 FE) MG EC tablet Take 650 mg by mouth 2 (two) times daily.    Yes [provider]  fluticasone (FLONASE) 50 MCG/ACT nasal spray PLACE 2 SPRAYS INTO BOTH NOSTRILS DAILY. 05/25/19  Yes [name], MD  gabapentin (NEURONTIN) 300 MG capsule Take 300 mg by  mouth at bedtime.  12/01/18 12/01/19 Yes [provider]  HUMALOG KWIKPEN 100 UNIT/ML KwikPen Inject 5-10 Units into the skin 3 (three) times daily. Sliding scale 09/01/18  Yes [provider]  insulin glargine (LANTUS) 100 unit/mL SOPN Inject 0.1 mLs (10 Units total) into the skin at bedtime. Currently using 15 units Patient taking differently: Inject 28 Units into the skin at bedtime.  11/16/16  Yes Fritzi Mandes, MD  lisinopril (ZESTRIL) 40 MG tablet Take 0.5 tablets (20 mg total) by mouth daily. 05/07/19  Yes Clegg, Amy D, NP  metolazone (ZAROXOLYN) 2.5 MG tablet Take 2.5 mg by mouth daily as needed (fluid retention).    Yes [provider]  montelukast (SINGULAIR) 10 MG tablet TAKE 1 TABLET BY MOUTH AT BEDTIME Patient taking differently: Take 10 mg by mouth at bedtime.  06/01/18  Yes Laverle Hobby, MD  Multiple Vitamin (MULTIVITAMIN WITH MINERALS) TABS tablet Take 1 tablet by mouth daily.   Yes [provider]  mycophenolate (MYFORTIC) 180 MG EC tablet Take 360 mg by mouth 2 (two) times daily.    Yes [provider]  omeprazole (PRILOSEC) 20 MG capsule TAKE 1 CAPSULE BY MOUTH ONCE DAILY Patient taking differently: Take  20 mg by mouth daily.  02/21/16  Yes [name], MD  potassium chloride (MICRO-K) 10 MEQ CR capsule Take 4 capsules (40 mEq total) by mouth every morning AND 2 capsules (20 mEq total) every evening. 04/26/19  Yes Simmons, Brittainy M, PA-C  rosuvastatin (CRESTOR) 10 MG tablet TAKE 1 TABLET BY MOUTH DAILY Patient taking differently: Take 10 mg by mouth daily.  06/09/18  Yes [name], MD  spironolactone (ALDACTONE) 25 MG tablet Take 25 mg by mouth daily. 11/16/16  Yes [provider]  tacrolimus (PROGRAF) 1 MG capsule Take 1 mg by mouth 2 (two) times daily.   Yes [provider]  tadalafil (CIALIS) 20 MG tablet Take 10-20 mg by mouth every other day as needed for erectile dysfunction. 01/20/19  Yes [provider]  tadalafil, PAH, (ADCIRCA) 20 MG tablet Take 2 tablets (40 mg total) by mouth daily. Patient taking differently: Take 20 mg by mouth daily.  02/03/19  Yes Theora Gianotti, NP  tamsulosin (FLOMAX) 0.4 MG CAPS capsule Take 0.4 mg by mouth daily.    Yes [provider]  torsemide (DEMADEX) 20 MG tablet Take torsemide 80 mg in am and 40 mg in pm Patient taking differently: Take torsemide 80 mg in am and 40 mg in pm 05/07/19  Yes Clegg, Amy D, NP  traMADol (ULTRAM) 50 MG tablet Take 1 tablet (50 mg total) by mouth 3 (three) times daily as needed. Patient taking differently: Take 50 mg by mouth 3 (three) times daily as needed for moderate pain.  01/25/19  Yes [name], MD    Inpatient Medications: Scheduled Meds: . amLODipine  5 mg Oral Daily  . apixaban  5 mg Oral BID  . buPROPion  150 mg Oral BID  . dexamethasone  6 mg Oral Daily  . fluticasone  2 spray Each Nare Daily  . folic acid  1 mg Oral Daily  . gabapentin  300 mg Oral QHS  . guaiFENesin  600 mg Oral BID  . insulin aspart  0-15 Units Subcutaneous TID AC & HS  . insulin glargine  10 Units Subcutaneous QHS  . melatonin  5 mg Oral QHS  . montelukast  10 mg Oral QHS    . multivitamin with  minerals  1 tablet Oral Daily  . mycophenolate  360 mg Oral BID  . pantoprazole  40 mg Oral Daily  . potassium chloride  10 mEq Oral Daily  . rosuvastatin  10 mg Oral Daily  . sodium chloride flush  3 mL Intravenous Q12H  . tacrolimus  1 mg Oral BID  . tadalafil (PAH)  40 mg Oral Daily  . tamsulosin  0.4 mg Oral Daily  . thiamine  100 mg Oral Daily  . torsemide  20 mg Oral BID   Continuous Infusions: . cefTRIAXone (ROCEPHIN)  IV 2 g (07/03/19 0945)  . remdesivir 100 mg in NS 100 mL 100 mg (07/03/19 0946)   PRN Meds: albuterol, fentaNYL (SUBLIMAZE) injection, guaiFENesin-dextromethorphan, ondansetron **OR** ondansetron (ZOFRAN) IV, senna-docusate, traMADol  Allergies:    Allergies  Allergen Reactions  . Morphine Itching  . Acetaminophen Other (See Comments)    BC of transplant  . Ibuprofen Other (See Comments)    Due to kidney transplant    Social History:   Social History   Socioeconomic History  . Marital status: Married    Spouse name: Not on file  . Number of children: 2  . Years of education: Not on file  . Highest education level: Not on file  Occupational History  . Occupation: Academic librarian: unemployed    Comment: disabled due to kidney failure  Tobacco Use  . Smoking status: Former Smoker    Types: Cigars    Quit date: 09/02/1994    Years since quitting: 24.8  . Smokeless tobacco: Never Used  Substance and Sexual Activity  . Alcohol use: Yes    Alcohol/week: 0.0 - 1.0 standard drinks    Comment: occasional- 3 in the past year  . Drug use: No  . Sexual activity: Not on file  Other Topics Concern  . Not on file  Social History Narrative   In school for culinary arts      Has living will   Wife is health care POA   Would accept resuscitation attempts   Hasn't considered tube feedings   Social Determinants of Health   Financial Resource Strain:   . Difficulty of Paying Living Expenses:   Food Insecurity:    . Worried About Charity fundraiser in the Last Year:   . Arboriculturist in the Last Year:   Transportation Needs:   . Film/video editor (Medical):   Marland Kitchen Lack of Transportation (Non-Medical):   Physical Activity:   . Days of Exercise per Week:   . Minutes of Exercise per Session:   Stress:   . Feeling of Stress :   Social Connections:   . Frequency of Communication with Friends and Family:   . Frequency of Social Gatherings with Friends and Family:   . Attends Religious Services:   . Active Member of Clubs or Organizations:   . Attends Archivist Meetings:   Marland Kitchen Marital Status:   Intimate Partner Violence:   . Fear of Current or Ex-Partner:   . Emotionally Abused:   Marland Kitchen Physically Abused:   . Sexually Abused:     Family History:    Family History  Problem Relation Age of Onset  . Heart disease Father   . Kidney failure Father   . Kidney disease Father   . Diabetes Maternal Grandmother   . Breast cancer Maternal Grandmother   . Valvular heart disease Mother   . Liver cancer Paternal Uncle   .  Liver cancer Paternal Grandmother   . Prostate cancer Neg Hx      ROS:  Please see the history of present illness.   All other ROS reviewed and negative.     Physical Exam/Data:   Vitals:   07/03/19 0500 07/03/19 0517 07/03/19 0522 07/03/19 0700  BP:   (!) 115/37 (!) 110/45  Pulse: (!) 44  (!) 47 (!) 40  Resp: (!) _0 Temp:   97.8 F (36.6 C) 97.6 F (36.4 C)  TempSrc:   Oral Oral  SpO2: 91%  95% 97%  Weight:  119.8 kg    Height:        Intake/Output Summary (Last 24 hours) at 07/03/2019 1448 Last data filed at 07/03/2019 0600 Gross per 24 hour  Intake 186.1 ml  Output --  Net 186.1 ml   Last 3 Weights 07/03/2019 07/01/2019 06/30/2019  Weight (lbs) 264 lb 1.8 oz 260 lb 5.8 oz 247 lb  Weight (kg) 119.8 kg 118.1 kg 112.038 kg  Some encounter information is confidential and restricted. Go to Review Flowsheets activity to see all data.     Body mass  index is 36.84 kg/m.  General:  Well nourished, well developed, in no acute distress HEENT: normal Lymph: no adenopathy Neck: no JVD Endocrine:  No thryomegaly Vascular: No carotid bruits; FA pulses 2+ bilaterally without bruits  Cardiac:  normal S1, S2; irregularly irregular; no murmur Lungs:  clear to auscultation bilaterally, no wheezing, rhonchi or rales  Abd: soft, nontender, no hepatomegaly  Ext: no edema Musculoskeletal:  No deformities, BUE and BLE strength normal and equal Skin: warm and dry  Neuro:  CNs 2-12 intact, no focal abnormalities noted Psych:  Normal affect   EKG:  The EKG was personally reviewed and demonstrates: Atrial fibrillation Telemetry:  Telemetry was personally reviewed and demonstrates: Atrial fibrillation heart rate 47-55 today.  Relevant CV Studies: TTE 04/2019 1. Left ventricular ejection fraction, by estimation, is 45 to 50%. The  left ventricle has mildly decreased function. The left ventrical  demonstrates global hypokinesis. The left ventricular internal cavity size  was mildly dilated. Left ventricular  diastolic parameters are indeterminate.  2. Right ventricular systolic function is moderately reduced. The right  ventricular size is moderately enlarged. There is moderately elevated  pulmonary artery systolic pressure. The estimated right ventricular  systolic pressure is 97.6 mmHg. D-shaped  interventricular septum suggests RV pressure/volume overload.  3. The aortic valve is tricuspid. No significant regurgitation or  stenosis.  4. The mitral valve is degenerative. No significant regurgitation or  stenosis.  5. Dilated IVC suggestive of RA pressure 15 mmHg.   Laboratory Data:  High Sensitivity Troponin:   Recent Labs  Lab 07/01/19 0309 07/01/19 0546  TROPONINIHS 62* 57*     Chemistry Recent Labs  Lab 07/01/19 1754 07/02/19 0657 07/03/19 0715  NA 135 135 134*  K 4.1 4.1 4.5  CL 104 104 105  CO2 _1 GLUCOSE 231*  112* 232*  BUN 52* 55* 70*  CREATININE 1.90* 1.67* 1.92*  CALCIUM 8.4* 8.4* 8.6*  GFRNONAA 38* 44* 37*  GFRAA 44* 51* 43*  ANIONGAP _2 Recent Labs  Lab 07/01/19 1754 07/02/19 0657 07/03/19 0715  PROT 6.7 6.4* 6.7  ALBUMIN 2.8* 2.7* 2.6*  AST 20 17 14*  ALT _3 ALKPHOS 87 86 91  BILITOT 0.8 0.8 0.6   Hematology Recent Labs  Lab 07/01/19 1754 07/02/19 0657 07/03/19  0715  WBC 3.6* 3.4* 2.6*  RBC 4.33 4.16* 4.51  HGB 10.7* 10.2* 10.9*  HCT 34.0* 33.3* 35.2*  MCV 78.5* 80.0 78.0*  MCH 24.7* 24.5* 24.2*  MCHC 31.5 30.6 31.0  RDW 20.4* 20.4* 20.0*  PLT 99* 100* 122*   BNPNo results for input(s): BNP, PROBNP in the last 168 hours.  DDimer No results for input(s): DDIMER in the last 168 hours.   Radiology/Studies:  CT CHEST WO CONTRAST  Result Date: 07/01/2019 CLINICAL DATA:  Hypoxia.  COVID-19 positive EXAM: CT CHEST WITHOUT CONTRAST TECHNIQUE: Multidetector CT imaging of the chest was performed following the standard protocol without IV contrast. COMPARISON:  January 16, 2019 chest CT; chest radiograph June 30, 2018 FINDINGS: Cardiovascular: There is no thoracic aortic aneurysm. There are scattered foci of aortic atherosclerosis as well as scattered foci of great vessel calcification. Patient is status post coronary artery bypass grafting. There is extensive native coronary artery calcification. There is cardiomegaly with calcification in the mitral valve. There is no pericardial effusion or pericardial thickening. There is prominence of the main pulmonary outflow tract measuring 3.5 cm. Mediastinum/Nodes: Visualized thyroid appears normal. There are multiple calcified lymph nodes consistent with prior granulomatous disease. A right paratracheal lymph node measures 3.1 x 2.8 cm with diffuse calcification, consistent with granulomatous disease change. Other smaller calcified lymph nodes noted as well. There are multiple noncalcified subcentimeter in size lymph nodes. No  noncalcified lymph nodes present meeting size criteria for pathologic significance. Small hiatal hernia present. Lungs/Pleura: There is extensive ground-glass type opacity throughout the lungs bilaterally with involvement to varying degrees of most lobes in segments. Both upper and lower lobe areas of ground-glass type opacity noted. There is mild consolidation in the posterior segment right upper lobe. No other appreciable consolidation noted. No appreciable pleural effusions. Upper Abdomen: In the visualized upper abdomen, there is evidence of previous gastric surgery without wall thickening. Incomplete visualization of spleen. Spleen measures 17.9 cm from anterior to posterior dimension, enlarged. There are multiple foci of arterial vascular calcification in the upper abdominal aorta and multiple mesenteric arteries. Musculoskeletal: Status post median sternotomy. Multiple foci of degenerative change noted in the lower thoracic spine. No blastic or lytic bone lesions. No evident chest wall lesions. IMPRESSION: 1. Multifocal pneumonia, likely of atypical organism etiology. Mild consolidation posterior segment right upper lobe. Most areas of opacification have a ground-glass type opacity appearance. 2. Evidence of prior granulomatous disease. Enlarged right paratracheal lymph node is calcified consistent with prior granulomatous disease. No frank adenopathy by size criteria involving noncalcified lymph nodes. 3. Aortic atherosclerosis. Multiple foci of native coronary artery calcification with evidence of previous coronary artery bypass grafting. Foci of aortic atherosclerosis as well as calcification in great vessels and multiple mesenteric arterial vessels noted. 4. Enlargement of the main pulmonary outflow tract is indicative of a degree of pulmonary arterial hypertension. 5.  Cardiomegaly.  Calcification of mitral valve. 6. Apparent splenomegaly with spleen incompletely visualized on this study. Aortic  Atherosclerosis (ICD10-I70.0). Electronically Signed   By: Lowella Grip III M.D.   On: 07/01/2019 11:23   DG Chest Port 1 View  Result Date: 06/30/2019 CLINICAL DATA:  59 year old male with cough and congestion. EXAM: PORTABLE CHEST 1 VIEW COMPARISON:  Chest radiograph dated 05/04/2019. FINDINGS: There is cardiomegaly with vascular congestion and probable edema. Diffuse interstitial nodularity may be related to vascular congestion although developing infiltrate is not excluded clinical correlation is recommended. There is no pleural effusion or pneumothorax. Calcified right paratracheal granuloma. Median  sternotomy wires and CABG vascular clips. No acute osseous pathology. IMPRESSION: Cardiomegaly with findings of CHF. Pneumonia is not excluded. Electronically Signed   By: Anner Crete M.D.   On: 06/30/2019 22:34         Assessment and Plan:   1.  Bradycardia -Patient is asymptomatic. -Feels well after taking shower today.  Telemetry shows heart rates of 47 to 55 bpm -Of of note, patient noted to have low heart rates upon med review for the past 2 years.  Heart rates have been as low as 45. -He is on no AV nodal blocking agents. -His acute illness might have caused a vagal response with further decreasing heart rate -Indication for therapy at this point.  Continue to avoid AV nodal blocking agents.  Patient has a history of severe bradycardia with beta-blockers. -Continue to monitor on telemetry.  2.  Heart failure reduced ejection fraction -Patient is currently euvolemic -Pressure is low normal. -Decrease lisinopril to 5 mg daily, decrease Aldactone to 12.5 mg daily -Not on beta-blockers due to bradycardia  3.  History of CAD/CABG -Statin, Eliquis  4.  Permanent A. Fib -Rate controlled on no AV blocking agents -Continue PTA Eliquis  5.  Pneumonia, COVID-19 positive -Likely cause of symptoms of decreased appetite, loss of taste, loss of smell, productive cough -Management  as per primary team   Signed, Kate Sable, MD  07/03/2019 2:48 PM

## 2019-07-03 NOTE — Progress Notes (Signed)
PROGRESS NOTE    [name].  UMP:[id number]  DOB: 03-Aug-1960  DOA: 06/30/2019 PCP: [name], MD Outpatient Specialists:   Hospital course:  Briefly, patient is a 59 year old man with multiple medical problems including status post renal transplantation 8 years ago presently on Prograf, HFpEF, atrial fibrillation on Eliquis, CAD status post CABG in 2005 denies MI and pulmonary artery hypertension who was admitted 07/01/2019 for malaise, fatigue, loss of taste and smell and shortness of breath.  Patient had received first Covid vaccine 3 weeks previously, he had lost his mother to Covid earlier this year.  Chest x-ray was notable for bilateral infiltrates suggestive of CHF versus pneumonia.  Patient himself told  me he is sure this is not CHF as he is 6 pounds under his dry weight of 153 pounds.  Initial procalcitonin was 0.54, patient was started on ceftriaxone.  Initial CRP was 17.   Subjective:  Sitting in the chair.  Feeling much better.  Had some choking episode while taking oral pills at once.  Would like to go home tomorrow if possible   Objective: Vitals:   07/03/19 0500 07/03/19 0517 07/03/19 0522 07/03/19 0700  BP:   (!) 115/37 (!) 110/45  Pulse: (!) 44  (!) 47 (!) 40  Resp: (!) _0 Temp:   97.8 F (36.6 C) 97.6 F (36.4 C)  TempSrc:   Oral Oral  SpO2: 91%  95% 97%  Weight:  119.8 kg    Height:        Intake/Output Summary (Last 24 hours) at 07/03/2019 1454 Last data filed at 07/03/2019 0600 Gross per 24 hour  Intake 186.1 ml  Output --  Net 186.1 ml   Filed Weights   06/30/19 1957 07/01/19 2153 07/03/19 0517  Weight: 112 kg 118.1 kg 119.8 kg     Exam:  General: Tired appearing man sitting up in bed in no acute distress, looking brighter than yesterday but still quite tired. Eyes: sclera anicteric, conjuctiva mild injection bilaterally CVS: S1-S2, regular  Respiratory:  decreased air entry bilaterally secondary to body habitus.   No rales.   GI: NABS, soft, NT  LE: No edema.  Neuro: A/O x 3, Moving all extremities equally with normal strength, CN 3-12 intact, grossly nonfocal.  Psych: patient is logical and coherent, judgement and insight appear normal, mood and affect appropriate to situation.   Data Reviewed: Assessment & Plan:   59 year old man with known combined CHF, status post renal transplant on tacrolimus, CAD and atrial fibrillation was admitted 07/01/19 with hypoxia and bilateral fluffy infiltrates.  Covid PCR is positive.  Pneumonia thought to be secondary to SARS-CoV-2 infection, however procalcitonin was 0.54 so patient is also on ceftriaxone.  Pneumonia likely secondary to COVID-19 versus CAP Patient was admitted with shortness of breath and bilateral fluffy infiltrates on chest x-ray suggestive of CHF. Patient himself is very clear that this is not CHF as he is 6 pounds below his dry weight. Given procalcitonin of 0.54, patient was also started on ceftriaxone. Patient feels improved today on present course of treatment. Continue ceftriaxone day #3 Continue remdesivir 3/5 Continue steroids 3/10  CHF Patient follows his dry weight very carefully and knows how to tailor his diuretics to weight. Patient wants to weigh himself today so he can follow whether he is gaining weight while in house. Patient is eating and drinking reasonably well, not on any IV fluids Metolazone continued. Hold torsemide has renal function getting worse  Status post renal transplant/CKD 3A Continue Prograf/tacrolimus Pressure is within normal limits, will continue present meds Creatinine is 1.7->1.9 today -hold torsemide  HTN Continue amlodipine and lisinopril with good control of blood pressure  Atrial fibrillation Patient does not appear to be on any rate control medications per med rec however heart rate is low normal. Continue Eliquis  CAD Patient has remote history of CABG, denies history of MI in past Patient  does not appear to be on a beta-blocker on admission, possibly secondary to baseline bradycardia I do not see any antiplatelet agents on med rec, this can be addressed as an outpatient as warranted.  DM 2 On admission patient was placed on glargine 10 and Farxiga was continued Blood sugars were higher likely due to being on steroids.  Continue monitoring  Pulmonary hypertension Continue tadalafil  Depression Continue Wellbutrin  BPH Continue Flomax   DVT prophylaxis: On Eliquis Code Status: Full Family Communication: Patient states family knows he is here Disposition Plan:   Patient is from: Home  Anticipated Discharge Location: Home  Barriers to Discharge: Ongoing acute Covid pneumonia.  He needs 2 more doses of remdesivir before discharge.  I am trying to coordinate outpatient remdesivir clinic on Monday that way he can go home tomorrow after fourth dose.  Is patient medically stable for Discharge: No   Consultants:  None  Procedures:  None  Antimicrobials:  Ceftriaxone started on 07/29/3891   Basic Metabolic Panel: Recent Labs  Lab 06/30/19 2006 07/01/19 0309 07/01/19 1754 07/02/19 0657 07/03/19 0715  NA 134* 137 135 135 134*  K 4.5 4.3 4.1 4.1 4.5  CL 103 106 104 104 105  CO2 _0 GLUCOSE 149* 174* 231* 112* 232*  BUN 47* 48* 52* 55* 70*  CREATININE 1.78* 1.88* 1.90* 1.67* 1.92*  CALCIUM 8.5* 8.2* 8.4* 8.4* 8.6*  MG  --  2.1  --  2.4 2.4  PHOS  --  3.8  --  3.6 3.9   Liver Function Tests: Recent Labs  Lab 06/30/19 2006 07/01/19 0309 07/01/19 1754 07/02/19 0657 07/03/19 0715  AST _1 14*  ALT _2 ALKPHOS 98 84 87 86 91  BILITOT 1.2 1.2 0.8 0.8 0.6  PROT 7.4 6.6 6.7 6.4* 6.7  ALBUMIN 3.0* 2.7* 2.8* 2.7* 2.6*   No results for input(s): LIPASE, AMYLASE in the last 168 hours. No results for input(s): AMMONIA in the last 168 hours. CBC: Recent Labs  Lab 06/30/19 2006 07/01/19 0309 07/01/19 1754 07/02/19 0657  07/03/19 0715  WBC 4.8 4.4 3.6* 3.4* 2.6*  NEUTROABS 3.4  --   --  2.1 1.8  HGB 11.4* 10.6* 10.7* 10.2* 10.9*  HCT 36.1* 34.3* 34.0* 33.3* 35.2*  MCV 78.1* 80.1 78.5* 80.0 78.0*  PLT 98* 97* 99* 100* 122*   Cardiac Enzymes: No results for input(s): CKTOTAL, CKMB, CKMBINDEX, TROPONINI in the last 168 hours. BNP (last 3 results) No results for input(s): PROBNP in the last 8760 hours. CBG: Recent Labs  Lab 07/02/19 1207 07/02/19 1739 07/02/19 2132 07/03/19 0807 07/03/19 1226  GLUCAP 137* 309* 404* 210* 244*    Recent Results (from the past 240 hour(s))  Culture, blood (Routine x 2)     Status: Abnormal   Collection Time: 06/30/19  8:07 PM   Specimen: BLOOD  Result Value Ref Range Status   Specimen Description   Final    BLOOD BLOOD RIGHT FOREARM Performed at [facility], [address] [place], [name] [address]    Special Requests   Final    BOTTLES DRAWN AEROBIC AND ANAEROBIC Blood Culture results may not be optimal due to an excessive volume of blood received in culture bottles Performed at [facility], [address] [place], [place] [address]    Culture  Setup Time   Final    GRAM POSITIVE COCCI ANAEROBIC BOTTLE ONLY CRITICAL RESULT CALLED TO, READ BACK BY AND VERIFIED WITH: CHRISTINE KATSOUDAS 07/01/19 @ 1354  [place]    Culture (A)  Final    STAPHYLOCOCCUS SPECIES (COAGULASE NEGATIVE) THE SIGNIFICANCE OF ISOLATING THIS ORGANISM FROM A SINGLE SET OF BLOOD CULTURES WHEN MULTIPLE SETS ARE DRAWN IS UNCERTAIN. PLEASE NOTIFY THE MICROBIOLOGY DEPARTMENT WITHIN ONE WEEK IF SPECIATION AND SENSITIVITIES ARE REQUIRED. Performed at [facility], [place] [address] [place], [place] [address]    Report Status 07/03/2019 FINAL  Final  Culture, blood (Routine x 2)     Status: None (Preliminary result)   Collection Time: 06/30/19  8:07 PM   Specimen: BLOOD  Result Value Ref Range Status   Specimen Description BLOOD BLOOD RIGHT WRIST  Final   Special  Requests   Final    BOTTLES DRAWN AEROBIC AND ANAEROBIC Blood Culture adequate volume   Culture   Final    NO GROWTH 3 DAYS Performed at [facility], [address], [place], [place] [address]    Report Status PENDING  Incomplete  Blood Culture ID Panel (Reflexed)     Status: Abnormal   Collection Time: 06/30/19  8:07 PM  Result Value Ref Range Status   Enterococcus species NOT DETECTED NOT DETECTED Final   Listeria monocytogenes NOT DETECTED NOT DETECTED Final   Staphylococcus species DETECTED (A) NOT DETECTED Final    Comment: Methicillin (oxacillin) susceptible coagulase negative staphylococcus. Possible blood culture contaminant (unless isolated from more than one blood culture draw or clinical case suggests pathogenicity). No antibiotic treatment is indicated for blood  culture contaminants. CRITICAL RESULT CALLED TO, READ BACK BY AND VERIFIED WITH: CHRISTINE KATSOUDAS 07/01/19 @ 8115  [place]    Staphylococcus aureus (BCID) NOT DETECTED NOT DETECTED Final   Methicillin resistance NOT DETECTED NOT DETECTED Final   Streptococcus species NOT DETECTED NOT DETECTED Final   Streptococcus agalactiae NOT DETECTED NOT DETECTED Final   Streptococcus pneumoniae NOT DETECTED NOT DETECTED Final   Streptococcus pyogenes NOT DETECTED NOT DETECTED Final   Acinetobacter baumannii NOT DETECTED NOT DETECTED Final   Enterobacteriaceae species NOT DETECTED NOT DETECTED Final   Enterobacter cloacae complex NOT DETECTED NOT DETECTED Final   Escherichia coli NOT DETECTED NOT DETECTED Final   Klebsiella oxytoca NOT DETECTED NOT DETECTED Final   Klebsiella pneumoniae NOT DETECTED NOT DETECTED Final   Proteus species NOT DETECTED NOT DETECTED Final   Serratia marcescens NOT DETECTED NOT DETECTED Final   Haemophilus influenzae NOT DETECTED NOT DETECTED Final   Neisseria meningitidis NOT DETECTED NOT DETECTED Final   Pseudomonas aeruginosa NOT DETECTED NOT DETECTED Final   Candida albicans NOT  DETECTED NOT DETECTED Final   Candida glabrata NOT DETECTED NOT DETECTED Final   Candida krusei NOT DETECTED NOT DETECTED Final   Candida parapsilosis NOT DETECTED NOT DETECTED Final   Candida tropicalis NOT DETECTED NOT DETECTED Final    Comment: Performed at [facility], [address] [place], [place] [address]  Respiratory Panel by RT PCR (Flu A&B, Covid) - Nasopharyngeal Swab     Status: Abnormal   Collection  Time: 06/30/19 11:56 PM   Specimen: Nasopharyngeal Swab  Result Value Ref Range Status   SARS Coronavirus 2 by RT PCR POSITIVE (A) NEGATIVE Final    Comment: RESULT CALLED TO, READ BACK BY AND VERIFIED WITH: Verneda Skill RN 2130 07/01/19 HNM (NOTE) SARS-CoV-2 target nucleic acids are DETECTED. SARS-CoV-2 RNA is generally detectable in upper respiratory specimens  during the acute phase of infection. Positive results are indicative of the presence of the identified virus, but do not rule out bacterial infection or co-infection with other pathogens not detected by the test. Clinical correlation with patient history and other diagnostic information is necessary to determine patient infection status. The expected result is Negative. Fact Sheet for Patients:  PinkCheek.be Fact Sheet for Healthcare Providers: GravelBags.it This test is not yet approved or cleared by the [place] FDA and  has been authorized for detection and/or diagnosis of SARS-CoV-2 by FDA under an Emergency Use Authorization (EUA).  This EUA will remain in effect (meaning this test can be used) for  the duration of  the COVID-19 declaration under Section 564(b)(1) of the Act, 21 U.S.C. section 360bbb-3(b)(1), unless the authorization is terminated or revoked sooner.    Influenza A by PCR NEGATIVE NEGATIVE Final   Influenza B by PCR NEGATIVE NEGATIVE Final    Comment: (NOTE) The Xpert Xpress SARS-CoV-2/FLU/RSV assay is intended as an  aid in  the diagnosis of influenza from Nasopharyngeal swab specimens and  should not be used as a sole basis for treatment. Nasal washings and  aspirates are unacceptable for Xpert Xpress SARS-CoV-2/FLU/RSV  testing. Fact Sheet for Patients: PinkCheek.be Fact Sheet for Healthcare Providers: GravelBags.it This test is not yet approved or cleared by the [place] FDA and  has been authorized for detection and/or diagnosis of SARS-CoV-2 by  FDA under an Emergency Use Authorization (EUA). This EUA will remain  in effect (meaning this test can be used) for the duration of the  Covid-19 declaration under Section 564(b)(1) of the Act, 21  U.S.C. section 360bbb-3(b)(1), unless the authorization is  terminated or revoked. Performed at [facility], [address], [place], [place] [address]   Urine culture     Status: Abnormal   Collection Time: 07/01/19  1:29 AM   Specimen: Urine, Random  Result Value Ref Range Status   Specimen Description   Final    URINE, RANDOM Performed at [facility], [address] [place], [place] [address]    Special Requests   Final    NONE Performed at [facility], [address] [place], [place] [address]    Culture (A)  Final    <10,000 COLONIES/mL INSIGNIFICANT GROWTH Performed at [facility], [place] [address] [place], [place] [address]    Report Status 07/02/2019 FINAL  Final  MRSA PCR Screening     Status: None   Collection Time: 07/01/19  5:54 PM   Specimen: Nasal Mucosa; Nasopharyngeal  Result Value Ref Range Status   MRSA by PCR NEGATIVE NEGATIVE Final    Comment:        The GeneXpert MRSA Assay (FDA approved for NASAL specimens only), is one component of a comprehensive MRSA colonization surveillance program. It is not intended to diagnose MRSA infection nor to guide or monitor treatment for MRSA infections. Performed at [facility], [address] [place],  [address]   Culture, sputum-assessment     Status: None   Collection Time: 07/01/19  5:54 PM  Specimen: Sputum  Result Value Ref Range Status   Specimen Description SPUTUM  Final   Special Requests NONE  Final   Sputum evaluation   Final    THIS SPECIMEN IS ACCEPTABLE FOR SPUTUM CULTURE Performed at [facility], [address] [place], [place] [address]    Report Status 07/01/2019 FINAL  Final  Culture, respiratory     Status: None (Preliminary result)   Collection Time: 07/01/19  5:54 PM   Specimen: SPU  Result Value Ref Range Status   Specimen Description   Final    SPUTUM Performed at [facility], [address] [place], [place] [address]    Special Requests   Final    NONE Reflexed from [id number] Performed at [facility], [address] [place], [place] [address]    Gram Stain   Final    FEW WBC PRESENT, PREDOMINANTLY PMN RARE GRAM POSITIVE COCCI IN CLUSTERS RARE GRAM POSITIVE RODS    Culture   Final    CULTURE REINCUBATED FOR BETTER GROWTH Performed at [facility], [name] [address], [place], [place] [address]    Report Status PENDING  Incomplete      Studies: No results found.   Scheduled Meds: . apixaban  5 mg Oral BID  . buPROPion  150 mg Oral BID  . dexamethasone  6 mg Oral Daily  . fluticasone  2 spray Each Nare Daily  . folic acid  1 mg Oral Daily  . gabapentin  300 mg Oral QHS  . guaiFENesin  600 mg Oral BID  . insulin aspart  0-15 Units Subcutaneous TID AC & HS  . insulin glargine  10 Units Subcutaneous QHS  . melatonin  5 mg Oral QHS  . montelukast  10 mg Oral QHS  . multivitamin with minerals  1 tablet Oral Daily  . mycophenolate  360 mg Oral BID  . pantoprazole  40 mg Oral Daily  . potassium chloride  10 mEq Oral Daily  . rosuvastatin  10 mg Oral Daily  . sodium chloride flush  3 mL Intravenous Q12H  . tacrolimus  1 mg Oral BID  . tadalafil (PAH)  40 mg  Oral Daily  . tamsulosin  0.4 mg Oral Daily  . thiamine  100 mg Oral Daily  . torsemide  20 mg Oral BID   Continuous Infusions: . cefTRIAXone (ROCEPHIN)  IV 2 g (07/03/19 0945)  . remdesivir 100 mg in NS 100 mL 100 mg (07/03/19 0946)    Principal Problem:   Pneumonia due to COVID-19 virus Active Problems:   Pneumonia   Atypical pneumonia     Raychelle Hudman Manuella Ghazi, [facility]  If 7PM-7AM, please contact night-coverage [email or web address] Password [email or web address] 07/03/2019, 2:54 PM    LOS: 2 days

## 2019-07-04 DIAGNOSIS — J9601 Acute respiratory failure with hypoxia: Secondary | ICD-10-CM

## 2019-07-04 DIAGNOSIS — A419 Sepsis, unspecified organism: Secondary | ICD-10-CM

## 2019-07-04 DIAGNOSIS — I5043 Acute on chronic combined systolic (congestive) and diastolic (congestive) heart failure: Secondary | ICD-10-CM

## 2019-07-04 DIAGNOSIS — R6520 Severe sepsis without septic shock: Secondary | ICD-10-CM

## 2019-07-04 DIAGNOSIS — I10 Essential (primary) hypertension: Secondary | ICD-10-CM

## 2019-07-04 LAB — COMPREHENSIVE METABOLIC PANEL
ALT: 10 U/L (ref 0–44)
AST: 13 U/L — ABNORMAL LOW (ref 15–41)
Albumin: 2.6 g/dL — ABNORMAL LOW (ref 3.5–5.0)
Alkaline Phosphatase: 85 U/L (ref 38–126)
Anion gap: 7 (ref 5–15)
BUN: 86 mg/dL — ABNORMAL HIGH (ref 6–20)
CO2: 23 mmol/L (ref 22–32)
Calcium: 8.7 mg/dL — ABNORMAL LOW (ref 8.9–10.3)
Chloride: 105 mmol/L (ref 98–111)
Creatinine, Ser: 1.87 mg/dL — ABNORMAL HIGH (ref 0.61–1.24)
GFR calc Af Amer: 45 mL/min — ABNORMAL LOW (ref >60)
GFR calc non Af Amer: 38 mL/min — ABNORMAL LOW (ref >60)
Glucose, Bld: 264 mg/dL — ABNORMAL HIGH (ref 70–99)
Potassium: 4.5 mmol/L (ref 3.5–5.1)
Sodium: 135 mmol/L (ref 135–145)
Total Bilirubin: 0.6 mg/dL (ref 0.3–1.2)
Total Protein: 6.3 g/dL — ABNORMAL LOW (ref 6.5–8.1)

## 2019-07-04 LAB — CULTURE, RESPIRATORY W GRAM STAIN: Culture: NORMAL

## 2019-07-04 LAB — CBC WITH DIFFERENTIAL/PLATELET
Abs Immature Granulocytes: 0.03 10*3/uL (ref 0.00–0.07)
Basophils Absolute: 0 10*3/uL (ref 0.0–0.1)
Basophils Relative: 0 %
Eosinophils Absolute: 0 10*3/uL (ref 0.0–0.5)
Eosinophils Relative: 0 %
HCT: 32.6 % — ABNORMAL LOW (ref 39.0–52.0)
Hemoglobin: 10.5 g/dL — ABNORMAL LOW (ref 13.0–17.0)
Immature Granulocytes: 1 %
Lymphocytes Relative: 17 %
Lymphs Abs: 0.7 10*3/uL (ref 0.7–4.0)
MCH: 24.4 pg — ABNORMAL LOW (ref 26.0–34.0)
MCHC: 32.2 g/dL (ref 30.0–36.0)
MCV: 75.6 fL — ABNORMAL LOW (ref 80.0–100.0)
Monocytes Absolute: 0.2 10*3/uL (ref 0.1–1.0)
Monocytes Relative: 6 %
Neutro Abs: 2.9 10*3/uL (ref 1.7–7.7)
Neutrophils Relative %: 76 %
Platelets: 154 10*3/uL (ref 150–400)
RBC: 4.31 MIL/uL (ref 4.22–5.81)
RDW: 20.2 % — ABNORMAL HIGH (ref 11.5–15.5)
WBC: 3.8 10*3/uL — ABNORMAL LOW (ref 4.0–10.5)
nRBC: 0 % (ref 0.0–0.2)

## 2019-07-04 LAB — GLUCOSE, CAPILLARY
Glucose-Capillary: 225 mg/dL — ABNORMAL HIGH (ref 70–99)
Glucose-Capillary: 298 mg/dL — ABNORMAL HIGH (ref 70–99)

## 2019-07-04 LAB — PHOSPHORUS: Phosphorus: 3.3 mg/dL (ref 2.5–4.6)

## 2019-07-04 LAB — MAGNESIUM: Magnesium: 2.4 mg/dL (ref 1.7–2.4)

## 2019-07-04 LAB — C-REACTIVE PROTEIN: CRP: 7.1 mg/dL — ABNORMAL HIGH (ref <1.0)

## 2019-07-04 LAB — FIBRIN DERIVATIVES D-DIMER (ARMC ONLY): Fibrin derivatives D-dimer (ARMC): 756.08 ng/mL (FEU) — ABNORMAL HIGH (ref 0.00–499.00)

## 2019-07-04 MED ORDER — ZINC SULFATE 220 (50 ZN) MG PO CAPS: 220.0000 mg | ORAL_CAPSULE | Freq: Two times a day (BID) | ORAL | 0 refills | Status: DC

## 2019-07-04 MED ORDER — LISINOPRIL 10 MG PO TABS
10.0000 mg | ORAL_TABLET | Freq: Every day | ORAL | Status: DC
  Administered 2019-07-04: 10 mg via ORAL
  Filled 2019-07-04: qty 1

## 2019-07-04 MED ORDER — SPIRONOLACTONE 25 MG PO TABS
25.0000 mg | ORAL_TABLET | Freq: Every day | ORAL | Status: DC
  Administered 2019-07-04: 08:00:00 25 mg via ORAL
  Filled 2019-07-04: qty 1

## 2019-07-04 MED ORDER — VITAMIN C 250 MG PO TABS: 250.0000 mg | ORAL_TABLET | Freq: Every day | ORAL | 0 refills | Status: DC

## 2019-07-04 NOTE — Discharge Instructions (Addendum)
**Note Carl-Identified via Obfuscation** You are scheduled for an outpatient infusion of Remdesivir at 10:00 AM on Monday 4/12.  Please report to [name] at [address]  Drive to Carl security guard and tell them you are here for an infusion. They will direct you to Carl front entrance where we will come and get you.  For questions call [phone].  Thanks   [name] for [name] and Prevention (CDC) has issued recommendations on discontinuation of home isolation. A non-test-based strategy is preferred for most patients.  For most symptomatic immunocompetent patients cared for at home, isolation can usually be discontinued when Carl following criteria are met:  At least 10 days have passed since symptoms first appeared AND  At least one day (24 hours) has passed since resolution of fever without Carl use of fever-reducing medications AND   There is improvement in symptoms (eg, cough, shortness of breath)  In some cases, patients may have had laboratory-confirmed COVID-19 but did not have any symptoms when they were tested. In such patients, home isolation can usually be discontinued using a time-based strategy (when at least 10 days have passed since Carl date of their first positive COVID-19 test) as long as there was no evidence of subsequent illness.  COVID-19 COVID-19 is a respiratory infection that is caused by a virus called severe acute respiratory syndrome coronavirus 2 (SARS-CoV-2). Carl disease is also known as coronavirus disease or novel coronavirus. In some people, Carl virus may not cause any symptoms. In others, it may cause a serious infection. Carl infection can get worse quickly and can lead to complications, such as:  Pneumonia, or infection of Carl lungs.  Acute respiratory distress syndrome or ARDS. This is a condition in which fluid build-up in Carl lungs prevents Carl lungs from filling with air and passing oxygen into Carl blood.  Acute respiratory failure. This is a condition  in which there is not enough oxygen passing from Carl lungs to Carl body or when carbon dioxide is not passing from Carl lungs out of Carl body.  Sepsis or septic shock. This is a serious bodily reaction to an infection.  Blood clotting problems.  Secondary infections due to bacteria or fungus.  Organ failure. This is when your body's organs stop working. Carl virus that causes COVID-19 is contagious. This means that it can spread from person to person through droplets from coughs and sneezes (respiratory secretions). What are Carl causes? This illness is caused by a virus. You may catch Carl virus by:  Breathing in droplets from an infected person. Droplets can be spread by a person breathing, speaking, singing, coughing, or sneezing.  Touching something, like a table or a doorknob, that was exposed to Carl virus (contaminated) and then touching your mouth, nose, or eyes. What increases Carl risk? Risk for infection You are more likely to be infected with this virus if you:  Are within 6 feet (2 meters) of a person with COVID-19.  Provide care for or live with a person who is infected with COVID-19.  Spend time in crowded indoor spaces or live in shared housing. Risk for serious illness You are more likely to become seriously ill from Carl virus if you:  Are 45 years of age or older. Carl higher your age, Carl more you are at risk for serious illness.  Live in a nursing home or long-term care facility.  Have cancer.  Have a long-term (chronic) disease such as: ? Chronic lung disease, including chronic  obstructive pulmonary disease or asthma. ? A long-term disease that lowers your body's ability to fight infection (immunocompromised). ? Heart disease, including heart failure, a condition in which Carl arteries that lead to Carl heart become narrow or blocked (coronary artery disease), a disease which makes Carl heart muscle thick, weak, or stiff (cardiomyopathy). ? Diabetes. ? Chronic kidney  disease. ? Sickle cell disease, a condition in which red blood cells have an abnormal "sickle" shape. ? Liver disease.  Are obese. What are Carl signs or symptoms? Symptoms of this condition can range from mild to severe. Symptoms may appear any time from 2 to 14 days after being exposed to Carl virus. They include:  A fever or chills.  A cough.  Difficulty breathing.  Headaches, body aches, or muscle aches.  Runny or stuffy (congested) nose.  A sore throat.  New loss of taste or smell. Some people may also have stomach problems, such as nausea, vomiting, or diarrhea. Other people may not have any symptoms of COVID-19. How is this diagnosed? This condition may be diagnosed based on:  Your signs and symptoms, especially if: ? You live in an area with a COVID-19 outbreak. ? You recently traveled to or from an area where Carl virus is common. ? You provide care for or live with a person who was diagnosed with COVID-19. ? You were exposed to a person who was diagnosed with COVID-19.  A physical exam.  Lab tests, which may include: ? Taking a sample of fluid from Carl back of your nose and throat (nasopharyngeal fluid), your nose, or your throat using a swab. ? A sample of mucus from your lungs (sputum). ? Blood tests.  Imaging tests, which may include, X-rays, CT scan, or ultrasound. How is this treated? At present, there is no medicine to treat COVID-19. Medicines that treat other diseases are being used on a trial basis to see if they are effective against COVID-19. Your health care provider will talk with you about ways to treat your symptoms. For most people, Carl infection is mild and can be managed at home with rest, fluids, and over-Carl-counter medicines. Treatment for a serious infection usually takes places in a [name] intensive care unit (ICU). It may include one or more of Carl following treatments. These treatments are given until your symptoms improve.  Receiving  fluids and medicines through an IV.  Supplemental oxygen. Extra oxygen is given through a tube in Carl nose, a face mask, or a hood.  Positioning you to lie on your stomach (prone position). This makes it easier for oxygen to get into Carl lungs.  Continuous positive airway pressure (CPAP) or bi-level positive airway pressure (BPAP) machine. This treatment uses mild air pressure to keep Carl airways open. A tube that is connected to a motor delivers oxygen to Carl body.  Ventilator. This treatment moves air into and out of Carl lungs by using a tube that is placed in your windpipe.  Tracheostomy. This is a procedure to create a hole in Carl neck so that a breathing tube can be inserted.  Extracorporeal membrane oxygenation (ECMO). This procedure gives Carl lungs a chance to recover by taking over Carl functions of Carl heart and lungs. It supplies oxygen to Carl body and removes carbon dioxide. Follow these instructions at home: Lifestyle  If you are sick, stay home except to get medical care. Your health care provider will tell you how long to stay home. Call your health care provider before you go  for medical care.  Rest at home as told by your health care provider.  Do not use any products that contain nicotine or tobacco, such as cigarettes, e-cigarettes, and chewing tobacco. If you need help quitting, ask your health care provider.  Return to your normal activities as told by your health care provider. Ask your health care provider what activities are safe for you. General instructions  Take over-Carl-counter and prescription medicines only as told by your health care provider.  Drink enough fluid to keep your urine pale yellow.  Keep all follow-up visits as told by your health care provider. This is important. How is this prevented?  There is no vaccine to help prevent COVID-19 infection. However, there are steps you can take to protect yourself and others from this virus. To protect  yourself:   Do not travel to areas where COVID-19 is a risk. Carl areas where COVID-19 is reported change often. To identify high-risk areas and travel restrictions, check Carl CDC travel website: FatFares.com.br  If you live in, or must travel to, an area where COVID-19 is a risk, take precautions to avoid infection. ? Stay away from people who are sick. ? Wash your hands often with soap and water for 20 seconds. If soap and water are not available, use an alcohol-based hand sanitizer. ? Avoid touching your mouth, face, eyes, or nose. ? Avoid going out in public, follow guidance from your state and local health authorities. ? If you must go out in public, wear a cloth face covering or face mask. Make sure your mask covers your nose and mouth. ? Avoid crowded indoor spaces. Stay at least 6 feet (2 meters) away from others. ? Disinfect objects and surfaces that are frequently touched every day. This may include:  Counters and tables.  Doorknobs and light switches.  Sinks and faucets.  Electronics, such as phones, remote controls, keyboards, computers, and tablets. To protect others: If you have symptoms of COVID-19, take steps to prevent Carl virus from spreading to others.  If you think you have a COVID-19 infection, contact your health care provider right away. Tell your health care team that you think you may have a COVID-19 infection.  Stay home. Leave your house only to seek medical care. Do not use public transport.  Do not travel while you are sick.  Wash your hands often with soap and water for 20 seconds. If soap and water are not available, use alcohol-based hand sanitizer.  Stay away from other members of your household. Let healthy household members care for children and pets, if possible. If you have to care for children or pets, wash your hands often and wear a mask. If possible, stay in your own room, separate from others. Use a different bathroom.  Make sure  that all people in your household wash their hands well and often.  Cough or sneeze into a tissue or your sleeve or elbow. Do not cough or sneeze into your hand or into Carl air.  Wear a cloth face covering or face mask. Make sure your mask covers your nose and mouth. Where to find more information  Centers for Disease Control and Prevention: PurpleGadgets.be  World Health Organization: https://www.castaneda.info/ Contact a health care provider if:  You live in or have traveled to an area where COVID-19 is a risk and you have symptoms of Carl infection.  You have had contact with someone who has COVID-19 and you have symptoms of Carl infection. Get help right away  if:  You have trouble breathing.  You have pain or pressure in your chest.  You have confusion.  You have bluish lips and fingernails.  You have difficulty waking from sleep.  You have symptoms that get worse. These symptoms may represent a serious problem that is an emergency. Do not wait to see if Carl symptoms will go away. Get medical help right away. Call your local emergency services (911 in [place] Do not drive yourself to [name]. Let Carl emergency medical personnel know if you think you have COVID-19. Summary  COVID-19 is a respiratory infection that is caused by a virus. It is also known as coronavirus disease or novel coronavirus. It can cause serious infections, such as pneumonia, acute respiratory distress syndrome, acute respiratory failure, or sepsis.  Carl virus that causes COVID-19 is contagious. This means that it can spread from person to person through droplets from breathing, speaking, singing, coughing, or sneezing.  You are more likely to develop a serious illness if you are 65 years of age or older, have a weak immune system, live in a nursing home, or have chronic disease.  There is no medicine to treat COVID-19. Your health care provider will talk with  you about ways to treat your symptoms.  Take steps to protect yourself and others from infection. Wash your hands often and disinfect objects and surfaces that are frequently touched every day. Stay away from people who are sick and wear a mask if you are sick. This information is not intended to replace advice given to you by your health care provider. Make sure you discuss any questions you have with your health care provider. Document Revised: 01/08/2019 Document Reviewed: 04/16/2018 Elsevier Patient Education  2020 [name].  COVID-19: How to Protect Yourself and Others Know how it spreads  There is currently no vaccine to prevent coronavirus disease 2019 (COVID-19).  Carl best way to prevent illness is to avoid being exposed to this virus.  Carl virus is thought to spread mainly from person-to-person. ? Between people who are in close contact with one another (within about 6 feet). ? Through respiratory droplets produced when an infected person coughs, sneezes or talks. ? These droplets can land in Carl mouths or noses of people who are nearby or possibly be inhaled into Carl lungs. ? COVID-19 may be spread by people who are not showing symptoms. Everyone should Clean your hands often  Wash your hands often with soap and water for at least 20 seconds especially after you have been in a public place, or after blowing your nose, coughing, or sneezing.  If soap and water are not readily available, use a hand sanitizer that contains at least 60% alcohol. Cover all surfaces of your hands and rub them together until they feel dry.  Avoid touching your eyes, nose, and mouth with unwashed hands. Avoid close contact  Limit contact with others as much as possible.  Avoid close contact with people who are sick.  Put distance between yourself and other people. ? Remember that some people without symptoms may be able to spread virus. ? This is especially important for people who are at  higher risk of getting very GainPain.com.cy Cover your mouth and nose with a mask when around others  You could spread COVID-19 to others even if you do not feel sick.  Everyone should wear a mask in public settings and when around people not living in their household, especially when social distancing is difficult  to maintain. ? Masks should not be placed on young children under age 31, anyone who has trouble breathing, or is unconscious, incapacitated or otherwise unable to remove Carl mask without assistance.  Carl mask is meant to protect other people in case you are infected.  Do NOT use a facemask meant for a Dietitian.  Continue to keep about 6 feet between yourself and others. Carl mask is not a substitute for social distancing. Cover coughs and sneezes  Always cover your mouth and nose with a tissue when you cough or sneeze or use Carl inside of your elbow.  Throw used tissues in Carl trash.  Immediately wash your hands with soap and water for at least 20 seconds. If soap and water are not readily available, clean your hands with a hand sanitizer that contains at least 60% alcohol. Clean and disinfect  Clean AND disinfect frequently touched surfaces daily. This includes tables, doorknobs, light switches, countertops, handles, desks, phones, keyboards, toilets, faucets, and sinks. RackRewards.fr  If surfaces are dirty, clean them: Use detergent or soap and water prior to disinfection.  Then, use a household disinfectant. You can see a list of EPA-registered household disinfectants here. [email or web address] 11/25/2018 This information is not intended to replace advice given to you by your health care provider. Make sure you discuss any questions you have with your health care provider. Document Revised: 12/03/2018 Document Reviewed:  10/01/2018 Elsevier Patient Education  [name] Can Do to Manage Your COVID-19 Symptoms at Home If you have possible or confirmed COVID-19: 1. Stay home from work and school. And stay away from other public places. If you must go out, avoid using any kind of public transportation, ridesharing, or taxis. 2. Monitor your symptoms carefully. If your symptoms get worse, call your healthcare provider immediately. 3. Get rest and stay hydrated. 4. If you have a medical appointment, call Carl healthcare provider ahead of time and tell them that you have or may have COVID-19. 5. For medical emergencies, call 911 and notify Carl dispatch personnel that you have or may have COVID-19. 6. Cover your cough and sneezes with a tissue or use Carl inside of your elbow. 7. Wash your hands often with soap and water for at least 20 seconds or clean your hands with an alcohol-based hand sanitizer that contains at least 60% alcohol. 8. As much as possible, stay in a specific room and away from other people in your home. Also, you should use a separate bathroom, if available. If you need to be around other people in or outside of Carl home, wear a mask. 9. Avoid sharing personal items with other people in your household, like dishes, towels, and bedding. 10. Clean all surfaces that are touched often, like counters, tabletops, and doorknobs. Use household cleaning sprays or wipes according to Carl label instructions. [email or web address] 09/23/2018 This information is not intended to replace advice given to you by your health care provider. Make sure you discuss any questions you have with your health care provider. Document Revised: 02/25/2019 Document Reviewed: 02/25/2019 Elsevier Patient Education  [name].   COVID-19 Frequently Asked Questions COVID-19 (coronavirus disease) is an infection that is caused by a large family of viruses. Some viruses cause illness in people and others  cause illness in animals like camels, cats, and bats. In some cases, Carl viruses that cause illness in animals can spread to humans. Where did Carl coronavirus come from? In December 2019, [place] told Carl World  Health Organization [name]) of several cases of lung disease (human respiratory illness). These cases were linked to an open seafood and livestock market in Carl city of [place]. Carl link to Carl seafood and livestock market suggests that Carl virus may have spread from animals to humans. However, since that first outbreak in December, Carl virus has also been shown to spread from person to person. What is Carl name of Carl disease and Carl virus? Disease name Early on, this disease was called novel coronavirus. This is because scientists determined that Carl disease was caused by a new (novel) respiratory virus. Carl World Health Organization [name]) has now named Carl disease COVID-19, or coronavirus disease. Virus name Carl virus that causes Carl disease is called severe acute respiratory syndrome coronavirus 2 (SARS-CoV-2). More information on disease and virus naming World Health Organization [name]): www.who.int/emergencies/diseases/novel-coronavirus-2019/technical-guidance/naming-Carl-coronavirus-disease-(covid-2019)-and-Carl-virus-that-causes-it Who is at risk for complications from coronavirus disease? Some people may be at higher risk for complications from coronavirus disease. This includes older adults and people who have chronic diseases, such as heart disease, diabetes, and lung disease. If you are at higher risk for complications, take these extra precautions:  Stay home as much as possible.  Avoid social gatherings and travel.  Avoid close contact with others. Stay at least 6 ft (2 m) away from others, if possible.  Wash your hands often with soap and water for at least 20 seconds.  Avoid touching your face, mouth, nose, or eyes.  Keep supplies on hand at home, such as food, medicine, and  cleaning supplies.  If you must go out in public, wear a cloth face covering or face mask. Make sure your mask covers your nose and mouth. How does coronavirus disease spread? Carl virus that causes coronavirus disease spreads easily from person to person (is contagious). You may catch Carl virus by:  Breathing in droplets from an infected person. Droplets can be spread by a person breathing, speaking, singing, coughing, or sneezing.  Touching something, like a table or a doorknob, that was exposed to Carl virus (contaminated) and then touching your mouth, nose, or eyes. Can I get Carl virus from touching surfaces or objects? There is still a lot that we do not know about Carl virus that causes coronavirus disease. Scientists are basing a lot of information on what they know about similar viruses, such as:  Viruses cannot generally survive on surfaces for long. They need a human body (host) to survive.  It is more likely that Carl virus is spread by close contact with people who are sick (direct contact), such as through: ? Shaking hands or hugging. ? Breathing in respiratory droplets that travel through Carl air. Droplets can be spread by a person breathing, speaking, singing, coughing, or sneezing.  It is less likely that Carl virus is spread when a person touches a surface or object that has Carl virus on it (indirect contact). Carl virus may be able to enter Carl body if Carl person touches a surface or object and then touches his or her face, eyes, nose, or mouth. Can a person spread Carl virus without having symptoms of Carl disease? It may be possible for Carl virus to spread before a person has symptoms of Carl disease, but this is most likely not Carl main way Carl virus is spreading. It is more likely for Carl virus to spread by being in close contact with people who are sick and breathing in Carl respiratory droplets spread by a person breathing, speaking, singing, coughing,  or sneezing. What are Carl  symptoms of coronavirus disease? Symptoms vary from person to person and can range from mild to severe. Symptoms may include:  Fever or chills.  Cough.  Difficulty breathing or feeling short of breath.  Headaches, body aches, or muscle aches.  Runny or stuffy (congested) nose.  Sore throat.  New loss of taste or smell.  Nausea, vomiting, or diarrhea. These symptoms can appear anywhere from 2 to 14 days after you have been exposed to Carl virus. Some people may not have any symptoms. If you develop symptoms, call your health care provider. People with severe symptoms may need [name] care. Should I be tested for this virus? Your health care provider will decide whether to test you based on your symptoms, history of exposure, and your risk factors. How does a health care provider test for this virus? Health care providers will collect samples to send for testing. Samples may include:  Taking a swab of fluid from Carl back of your nose and throat, your nose, or your throat.  Taking fluid from Carl lungs by having you cough up mucus (sputum) into a sterile cup.  Taking a blood sample. Is there a treatment or vaccine for this virus? Currently, there is no vaccine to prevent coronavirus disease. Also, there are no medicines like antibiotics or antivirals to treat Carl virus. A person who becomes sick is given supportive care, which means rest and fluids. A person may also relieve his or her symptoms by using over-Carl-counter medicines that treat sneezing, coughing, and runny nose. These are Carl same medicines that a person takes for Carl common cold. If you develop symptoms, call your health care provider. People with severe symptoms may need [name] care. What can I do to protect myself and my family from this virus?     You can protect yourself and your family by taking Carl same actions that you would take to prevent Carl spread of other viruses. Take Carl following actions:  Wash your  hands often with soap and water for at least 20 seconds. If soap and water are not available, use alcohol-based hand sanitizer.  Avoid touching your face, mouth, nose, or eyes.  Cough or sneeze into a tissue, sleeve, or elbow. Do not cough or sneeze into your hand or Carl air. ? If you cough or sneeze into a tissue, throw it away immediately and wash your hands.  Disinfect objects and surfaces that you frequently touch every day.  Stay away from people who are sick.  Avoid going out in public, follow guidance from your state and local health authorities.  Avoid crowded indoor spaces. Stay at least 6 ft (2 m) away from others.  If you must go out in public, wear a cloth face covering or face mask. Make sure your mask covers your nose and mouth.  Stay home if you are sick, except to get medical care. Call your health care provider before you get medical care. Your health care provider will tell you how long to stay home.  Make sure your vaccines are up to date. Ask your health care provider what vaccines you need. What should I do if I need to travel? Follow travel recommendations from your local health authority, Carl CDC, and WHO. Travel information and advice  Centers for Disease Control and Prevention (CDC): BodyEditor.hu  World Health Organization [name] [address]): ThirdIncome.ca Know Carl risks and take action to protect your health  You are at higher risk of getting coronavirus disease  if you are traveling to areas with an outbreak or if you are exposed to travelers from areas with an outbreak.  Wash your hands often and practice good hygiene to lower Carl risk of catching or spreading Carl virus. What should I do if I am sick? General instructions to stop Carl spread of infection  Wash your hands often with soap and water for at least 20 seconds. If soap and water are not available, use  alcohol-based hand sanitizer.  Cough or sneeze into a tissue, sleeve, or elbow. Do not cough or sneeze into your hand or Carl air.  If you cough or sneeze into a tissue, throw it away immediately and wash your hands.  Stay home unless you must get medical care. Call your health care provider or local health authority before you get medical care.  Avoid public areas. Do not take public transportation, if possible.  If you can, wear a mask if you must go out of Carl house or if you are in close contact with someone who is not sick. Make sure your mask covers your nose and mouth. Keep your home clean  Disinfect objects and surfaces that are frequently touched every day. This may include: ? Counters and tables. ? Doorknobs and light switches. ? Sinks and faucets. ? Electronics such as phones, remote controls, keyboards, computers, and tablets.  Wash dishes in hot, soapy water or use a dishwasher. Air-dry your dishes.  Wash laundry in hot water. Prevent infecting other household members  Let healthy household members care for children and pets, if possible. If you have to care for children or pets, wash your hands often and wear a mask.  Sleep in a different bedroom or bed, if possible.  Do not share personal items, such as razors, toothbrushes, deodorant, combs, brushes, towels, and washcloths. Where to find more information Centers for Disease Control and Prevention (CDC)  Information and news updates: https://www.butler-gonzalez.com/ World Health Organization [facility])  Information and news updates: MissExecutive.com.ee  Coronavirus health topic: https://www.castaneda.info/  Questions and answers on COVID-19: OpportunityDebt.at  Global tracker: who.sprinklr.com American Academy of Pediatrics (AAP)  Information for families:  www.healthychildren.org/English/health-issues/conditions/chest-lungs/Pages/2019-Novel-Coronavirus.aspx Carl coronavirus situation is changing rapidly. Check your local health authority website or Carl CDC and [name] websites for updates and news. When should I contact a health care provider?  Contact your health care provider if you have symptoms of an infection, such as fever or cough, and you: ? Have been near anyone who is known to have coronavirus disease. ? Have come into contact with a person who is suspected to have coronavirus disease. ? Have traveled to an area where there is an outbreak of COVID-19. When should I get emergency medical care?  Get help right away by calling your local emergency services (911 in Carl [place] if you have: ? Trouble breathing. ? Pain or pressure in your chest. ? Confusion. ? Blue-tinged lips and fingernails. ? Difficulty waking from sleep. ? Symptoms that get worse. Let Carl emergency medical personnel know if you think you have coronavirus disease. Summary  A new respiratory virus is spreading from person to person and causing COVID-19 (coronavirus disease).  Carl virus that causes COVID-19 appears to spread easily. It spreads from one person to another through droplets from breathing, speaking, singing, coughing, or sneezing.  Older adults and those with chronic diseases are at higher risk of disease. If you are at higher risk for complications, take extra precautions.  There is currently no vaccine to prevent coronavirus  disease. There are no medicines, such as antibiotics or antivirals, to treat Carl virus.  You can protect yourself and your family by washing your hands often, avoiding touching your face, and covering your coughs and sneezes. This information is not intended to replace advice given to you by your health care provider. Make sure you discuss any questions you have with your health care provider. Document Revised: 01/08/2019 Document  Reviewed: 07/07/2018 Elsevier Patient Education  [employer].

## 2019-07-04 NOTE — Progress Notes (Signed)
Patient scheduled for outpatient Remdesivir infusion at 10:00 AM on Monday 4/12.  Please advise them to report to [facility] at [address].  Drive to the security guard and tell them you are here for an infusion. They will direct you to the front entrance where we will come and get you.  For questions call [phone].  Thanks

## 2019-07-04 NOTE — Discharge Summary (Signed)
[place] [facility] [name]    MR#:  [id number]  DATE OF BIRTH:  1961-01-12  DATE OF ADMISSION:  06/30/2019   ADMITTING PHYSICIAN: Carl Beach, MD  DATE OF DISCHARGE: 07/04/2019 12:14 PM  PRIMARY CARE PHYSICIAN: Carl Carbon, MD   ADMISSION DIAGNOSIS:  Pneumonia [J18.9] Atypical pneumonia [J18.9] Sepsis with acute hypoxic respiratory failure without septic shock, due to unspecified organism ([name]) [A41.9, R65.20, J96.01] COVID-19 [U07.1] DISCHARGE DIAGNOSIS:  Principal Problem:   COVID-19 Active Problems:   Essential hypertension   Coronary artery disease involving native coronary artery of native heart without angina pectoris   Bradycardia   Acute on chronic combined systolic and diastolic CHF (congestive heart failure) (HCC)   Pneumonia   Atypical pneumonia   Permanent atrial fibrillation (HCC)   Sepsis with acute hypoxic respiratory failure without septic shock ([place])  SECONDARY DIAGNOSIS:   Past Medical History:  Diagnosis Date  . Allergy    seasonal  . Amnestic MCI (mild cognitive impairment with memory loss)   . Asthma   . Benign neoplasm of colon   . CAD (coronary artery disease) 2005   a.  Status post four-vessel CABG in 2005; b. 03/2018 MV: Small, fixed inferoapical and apical septal defect. No ischemia. EF 47% (60-65% by 05/2018 Echo).  . Chronic atrial fibrillation ([place])    a. CHADS2VASc 4 (CHF, HTN, DM, vascular disease)--> [name]; b. 09/2018 Zio: Chronic Afib.  . Chronic combined systolic and diastolic CHF (congestive heart failure) ([place])    a.  7/18 Echo: EF 45-50%; b. 05/2018 Echo: EF 60-65%, RVSP 74.8; c. 12/2018 Echo: EF 50-55%. Sev dil LA.  Marland Kitchen Chronic venous stasis dermatitis of both lower extremities   . Cytomegaloviral disease ([place]) 2017  . Depression   . Diabetes mellitus   . Diverticulosis   . Dyspnea   . Erectile dysfunction   . ESRD (end stage renal disease) ([place]) 2005   a. HD 2004-2012; b. S/p  renal transplant in 2012  . GERD (gastroesophageal reflux disease)   . Gout   . Hyperlipidemia    low since renal failure  . Hypertension   . Kidney transplant status, cadaveric 2012  . Obesity   . PAH (pulmonary artery hypertension) ([place])    a. 05/2018 Echo: RVSP 74.3mHg; b. 12/2018 RV not well visualized.  . Pneumonia   . Purpura ([place]   . Sleep apnea    bipap with oxygen 2 l  . Trigger finger   . Ulcer 05/2016   Left shin  . Wears glasses    HOSPITAL COURSE:  59year old man with multiple medical problems including status post renal transplantation 8 years ago presently on [name], HFpEF, atrial fibrillation on [name], CAD status post CABG in 2005 denies MI and pulmonary artery hypertension who was admitted 07/01/2019 for malaise, fatigue, loss of taste and smell, shortness of breath, hypoxia and bilateral fluffy infiltrates on CXR.  Covid PCR is positive.  Pneumonia thought to be secondary to [name] infection, however procalcitonin was 0.54 so patient is also on ceftriaxone.  Pneumonia likely secondary to COVID-19 and CAP Patient was admitted with shortness of breath and bilateral fluffy infiltrates on chest x-ray suggestive of CHF. Given procalcitonin of 0.54, patient was also started on ceftriaxone. Completed 4/5 of IV [name] and will finish fifth dose as an outpatient with [name].  He is scheduled for tomorrow. He was also treated with IV steroids and responded well.  He is  on room air  Bradycardia -Patient is asymptomatic. -Heart rate 54 today -No intervention indicated or planned per cardiology.  Heart failure reduced ejection fraction -Patient is currently euvolemic -[name], [name] not on beta-blockers due to bradycardia.  Status post renal transplant/CKD 3A Continue [name]/[name] Pressure is within normal limits, will continue present meds Creatinine is 1.87  at discharge  HTN Continue amlodipine and  [name] with good control of blood pressure  Atrial fibrillation Patient does not appear to be on any rate control medications per med rec however heart rate is low normal. Continue [name]  CAD Patient has remote history of CABG, denies history of MI in past Patient does not appear to be on a beta-blocker on admission, possibly secondary to baseline bradycardia I do not see any antiplatelet agents on med rec, this can be addressed as an outpatient as warranted.  DM 2 On admission patient was placed on glargine 10 and [name] was continued Blood sugars were higher likely due to being on steroids.  Continue monitoring  Pulmonary hypertension Continue tadalafil  Depression Continue [name]  BPH Continue [name]   DISCHARGE CONDITIONS:  Stable CONSULTS OBTAINED:  Treatment Team:  Carl Sable, MD DRUG ALLERGIES:   Allergies  Allergen Reactions  . Morphine Itching  . Acetaminophen Other (See Comments)    BC of transplant  . Ibuprofen Other (See Comments)    Due to kidney transplant   DISCHARGE MEDICATIONS:   Allergies as of 07/04/2019      Reactions   Morphine Itching   Acetaminophen Other (See Comments)   BC of transplant   Ibuprofen Other (See Comments)   Due to kidney transplant      Medication List    TAKE these medications   amLODipine 5 MG tablet Commonly known as: [name] Take 1 tablet (5 mg total) by mouth daily.   apixaban 5 MG Tabs tablet Commonly known as: [name] Take 1 tablet (5 mg total) by mouth 2 (two) times daily.   beclomethasone 80 MCG/ACT inhaler Commonly known as: [name] Inhale 2 puffs into the lungs 2 (two) times daily.   buPROPion 150 MG 12 hr tablet Commonly known as: [name] SR TAKE 1 TABLET BY MOUTH TWICE DAILY   colchicine 0.6 MG tablet Take 1 tablet (0.6 mg total) by mouth daily. What changed:   when to take this  reasons to take this   dapagliflozin propanediol 10 MG Tabs tablet Commonly known as:  [name] Take 10 mg by mouth daily before breakfast.   ferrous sulfate 325 (65 FE) MG EC tablet Take 650 mg by mouth 2 (two) times daily.   fluticasone 50 MCG/ACT nasal spray Commonly known as: [name] PLACE 2 SPRAYS INTO BOTH NOSTRILS DAILY.   gabapentin 300 MG capsule Commonly known as: NEURONTIN Take 300 mg by mouth at bedtime.   HumaLOG KwikPen 100 UNIT/ML KwikPen Generic drug: insulin lispro Inject 5-10 Units into the skin 3 (three) times daily. Sliding scale   insulin glargine 100 unit/mL Sopn Commonly known as: LANTUS Inject 0.1 mLs (10 Units total) into the skin at bedtime. Currently using 15 units What changed:   how much to take  additional instructions   [name] 40 MG tablet Commonly known as: ZESTRIL Take 0.5 tablets (20 mg total) by mouth daily.   metolazone 2.5 MG tablet Commonly known as: ZAROXOLYN Take 2.5 mg by mouth daily as needed (fluid retention).   montelukast 10 MG tablet Commonly known as: SINGULAIR TAKE 1 TABLET BY MOUTH AT BEDTIME   multivitamin  with minerals Tabs tablet Take 1 tablet by mouth daily.   Myfortic 180 MG EC tablet Generic drug: mycophenolate Take 360 mg by mouth 2 (two) times daily.   omeprazole 20 MG capsule Commonly known as: PRILOSEC TAKE 1 CAPSULE BY MOUTH ONCE DAILY   potassium chloride 10 MEQ CR capsule Commonly known as: MICRO-K Take 4 capsules (40 mEq total) by mouth every morning AND 2 capsules (20 mEq total) every evening.   rosuvastatin 10 MG tablet Commonly known as: CRESTOR TAKE 1 TABLET BY MOUTH DAILY   spironolactone 25 MG tablet Commonly known as: [name] Take 25 mg by mouth daily.   [name] 1 MG capsule Commonly known as: [name] Take 1 mg by mouth 2 (two) times daily.   tadalafil (PAH) 20 MG tablet Commonly known as: ADCIRCA Take 2 tablets (40 mg total) by mouth daily. What changed: how much to take   tadalafil 20 MG tablet Commonly known as: CIALIS Take 10-20 mg by mouth every  other day as needed for erectile dysfunction.   tamsulosin 0.4 MG Caps capsule Commonly known as: [name] Take 0.4 mg by mouth daily.   torsemide 20 MG tablet Commonly known as: DEMADEX Take torsemide 80 mg in am and 40 mg in pm   traMADol 50 MG tablet Commonly known as: ULTRAM Take 1 tablet (50 mg total) by mouth 3 (three) times daily as needed. What changed: reasons to take this   vitamin C 250 MG tablet Commonly known as: ASCORBIC ACID Take 1 tablet (250 mg total) by mouth daily.   zinc sulfate 220 (50 Zn) MG capsule Take 1 capsule (220 mg total) by mouth 2 (two) times daily.      DISCHARGE INSTRUCTIONS:  Patient scheduled for outpatient [name] infusion at 10:00 AM on Monday 4/12.  Please advise them to report to [facility] at [address].  Drive to the security guard and tell them you are here for an infusion. They will direct you to the front entrance where we will come and get you.  For questions call [phone].   DIET:  Cardiac diet DISCHARGE CONDITION:  Stable ACTIVITY:  Activity as tolerated OXYGEN:  Home Oxygen: No.  Oxygen Delivery: room air DISCHARGE LOCATION:  home   If you experience worsening of your admission symptoms, develop shortness of breath, life threatening emergency, suicidal or homicidal thoughts you must seek medical attention immediately by calling 911 or calling your MD immediately  if symptoms less severe.  You Must read complete instructions/literature along with all the possible adverse reactions/side effects for all the Medicines you take and that have been prescribed to you. Take any new Medicines after you have completely understood and accpet all the possible adverse reactions/side effects.   Please note  You were cared for by a hospitalist during your hospital stay. If you have any questions about your discharge medications or the care you received while you were in the hospital after you are discharged, you can  call the unit and asked to speak with the hospitalist on call if the hospitalist that took care of you is not available. Once you are discharged, your primary care physician will handle any further medical issues. Please note that NO REFILLS for any discharge medications will be authorized once you are discharged, as it is imperative that you return to your primary care physician (or establish a relationship with a primary care physician if you do not have one) for your aftercare needs so that they  can reassess your need for medications and monitor your lab values.    On the day of Discharge:  VITAL SIGNS:  Blood pressure 135/74, pulse (!) 52, temperature 98.6 F (37 C), temperature source Oral, resp. rate 16, height _0  (1.803 m), weight 119.8 kg, SpO2 96 %. PHYSICAL EXAMINATION:  GENERAL:  59 y.o.-year-old patient lying in the bed with no acute distress.  EYES: Pupils equal, round, reactive to light and accommodation. No scleral icterus. Extraocular muscles intact.  HEENT: Head atraumatic, normocephalic. Oropharynx and nasopharynx clear.  NECK:  Supple, no jugular venous distention. No thyroid enlargement, no tenderness.  LUNGS: Normal breath sounds bilaterally, no wheezing, rales,rhonchi or crepitation. No use of accessory muscles of respiration.  CARDIOVASCULAR: S1, S2 normal. No murmurs, rubs, or gallops.  ABDOMEN: Soft, non-tender, non-distended. Bowel sounds present. No organomegaly or mass.  EXTREMITIES: No pedal edema, cyanosis, or clubbing.  NEUROLOGIC: Cranial nerves II through XII are intact. Muscle strength 5/5 in all extremities. Sensation intact. Gait not checked.  PSYCHIATRIC: The patient is alert and oriented x 3.  SKIN: No obvious rash, lesion, or ulcer.  DATA REVIEW:   CBC Recent Labs  Lab 07/04/19 0555  WBC 3.8*  HGB 10.5*  HCT 32.6*  PLT 154    Chemistries  Recent Labs  Lab 07/04/19 0555  NA 135  K 4.5  CL 105  CO2 23  GLUCOSE 264*  BUN 86*    CREATININE 1.87*  CALCIUM 8.7*  MG 2.4  AST 13*  ALT 10  ALKPHOS 85  BILITOT 0.6     Outpatient follow-up Follow-up Information    Carl Carbon, MD. Schedule an appointment as soon as possible for a visit in 5 day(s).   Specialties: Internal Medicine, Pediatrics Why: Please follow up with your doctor within 5 days of discharge. Contact information: [place] [address] [phone]        [name], MD. Schedule an appointment as soon as possible for a visit in 1 week(s).   Specialty: Cardiology Contact information: [place] [address] [phone]        [name], MD. Schedule an appointment as soon as possible for a visit in 2 week(s).   Specialty: Cardiology Contact information: [place] [address] [phone]            Management plans discussed with the patient, family and they are in agreement.  CODE STATUS: Full Code   TOTAL TIME TAKING CARE OF THIS PATIENT: 45 minutes.    Max Sane M.D on 07/04/2019 at 2:48 PM  [facility]   CC: Primary care physician; Carl Carbon, MD   Note: This dictation was prepared with Dragon dictation along with smaller phrase technology. Any transcriptional errors that result from this process are unintentional.

## 2019-07-04 NOTE — Progress Notes (Signed)
Progress Note  Patient Name: [name]. Date of Encounter: 07/04/2019  Primary Cardiologist: [name], MD   Subjective   Patient feels well, no acute events overnight.  Was told he is being discharged today.  Inpatient Medications    Scheduled Meds: . apixaban  5 mg Oral BID  . buPROPion  150 mg Oral BID  . dexamethasone  6 mg Oral Daily  . fluticasone  2 spray Each Nare Daily  . folic acid  1 mg Oral Daily  . gabapentin  300 mg Oral QHS  . guaiFENesin  600 mg Oral BID  . insulin aspart  0-15 Units Subcutaneous TID AC & HS  . insulin glargine  10 Units Subcutaneous QHS  . lisinopril  10 mg Oral Daily  . melatonin  5 mg Oral QHS  . montelukast  10 mg Oral QHS  . multivitamin with minerals  1 tablet Oral Daily  . mycophenolate  360 mg Oral BID  . pantoprazole  40 mg Oral Daily  . potassium chloride  10 mEq Oral Daily  . rosuvastatin  10 mg Oral Daily  . sodium chloride flush  3 mL Intravenous Q12H  . spironolactone  25 mg Oral Daily  . tacrolimus  1 mg Oral BID  . tadalafil (PAH)  40 mg Oral Daily  . tamsulosin  0.4 mg Oral Daily  . thiamine  100 mg Oral Daily   Continuous Infusions: . cefTRIAXone (ROCEPHIN)  IV 2 g (07/04/19 0951)  . remdesivir 100 mg in NS 100 mL 100 mg (07/04/19 0831)   PRN Meds: albuterol, fentaNYL (SUBLIMAZE) injection, guaiFENesin-dextromethorphan, ondansetron **OR** ondansetron (ZOFRAN) IV, senna-docusate, traMADol   Vital Signs    Vitals:   07/03/19 2240 07/03/19 2359 07/04/19 0000 07/04/19 0738  BP:  (!) 127/50 (!) 127/50 135/74  Pulse: (!) 54 (!) 54 (!) 52 (!) 52  Resp: (!) 21 (!) 26 (!) 26 16  Temp:  (!) 97.4 F (36.3 C)  98.6 F (37 C)  TempSrc:  Oral  Oral  SpO2: 97% 96% 94% 96%  Weight:      Height:        Intake/Output Summary (Last 24 hours) at 07/04/2019 1049 Last data filed at 07/04/2019 0626 Gross per 24 hour  Intake --  Output 700 ml  Net -700 ml   Last 3 Weights 07/03/2019 07/01/2019 06/30/2019   Weight (lbs) 264 lb 1.8 oz 260 lb 5.8 oz 247 lb  Weight (kg) 119.8 kg 118.1 kg 112.038 kg  Some encounter information is confidential and restricted. Go to Review Flowsheets activity to see all data.      Telemetry    Atrial fibrillation, heart rate 54- Personally Reviewed  ECG    No new tracing obtained- Personally Reviewed  Physical Exam   GEN: No acute distress.   Neck: No JVD Cardiac:  Irregular irregular, no murmurs, rubs, or gallops.  Respiratory: Coarse breath sounds. GI: Soft, nontender, non-distended  MS: No edema; No deformity. Neuro:  Nonfocal  Psych: Normal affect   Labs    High Sensitivity Troponin:   Recent Labs  Lab 07/01/19 0309 07/01/19 0546  TROPONINIHS 62* 57*      Chemistry Recent Labs  Lab 07/02/19 0657 07/03/19 0715 07/04/19 0555  NA 135 134* 135  K 4.1 4.5 4.5  CL 104 105 105  CO2 _0 GLUCOSE 112* 232* 264*  BUN 55* 70* 86*  CREATININE 1.67* 1.92* 1.87*  CALCIUM 8.4* 8.6* 8.7*  PROT 6.4* 6.7 6.3*  ALBUMIN 2.7* 2.6* 2.6*  AST 17 14* 13*  ALT _0 ALKPHOS 86 91 85  BILITOT 0.8 0.6 0.6  GFRNONAA 44* 37* 38*  GFRAA 51* 43* 45*  ANIONGAP _1 Hematology Recent Labs  Lab 07/02/19 0657 07/03/19 0715 07/04/19 0555  WBC 3.4* 2.6* 3.8*  RBC 4.16* 4.51 4.31  HGB 10.2* 10.9* 10.5*  HCT 33.3* 35.2* 32.6*  MCV 80.0 78.0* 75.6*  MCH 24.5* 24.2* 24.4*  MCHC 30.6 31.0 32.2  RDW 20.4* 20.0* 20.2*  PLT 100* 122* 154    BNPNo results for input(s): BNP, PROBNP in the last 168 hours.   DDimer No results for input(s): DDIMER in the last 168 hours.   Radiology    No results found.  Cardiac Studies   TTE 04/2019 1. Left ventricular ejection fraction, by estimation, is 45 to 50%. The  left ventricle has mildly decreased function. The left ventrical  demonstrates global hypokinesis. The left ventricular internal cavity size  was mildly dilated. Left ventricular  diastolic parameters are indeterminate.  2. Right  ventricular systolic function is moderately reduced. The right  ventricular size is moderately enlarged. There is moderately elevated  pulmonary artery systolic pressure. The estimated right ventricular  systolic pressure is 63.8 mmHg. D-shaped  interventricular septum suggests RV pressure/volume overload.  3. The aortic valve is tricuspid. No significant regurgitation or  stenosis.  4. The mitral valve is degenerative. No significant regurgitation or  stenosis.  5. Dilated IVC suggestive of RA pressure 15 mmHg.   Patient Profile     59 y.o. male with a hx of CAD status post CABG, ischemic cardiomyopathy last EF45-50%, chronic A. fib on Eliquis, end-stage renal disease who is being seen today for the evaluation of bradycardia  Assessment & Plan    1.  Bradycardia -Patient is asymptomatic. -Heart rate 54 today -No intervention indicated or planned.  2.  Heart failure reduced ejection fraction -Patient is currently euvolemic -Lisinopril, Aldactone not on beta-blockers due to bradycardia.  3. History of hypertension, blood pressures have been running low at home. -Hold Norvasc on discharge -Decrease outpatient lisinopril dose to 10 mg daily, continue Aldactone 25 mg daily. -Follow-up in the office upon discharge for further titration of BP meds if necessary.  4.  History of CAD/CABG -Statin, Eliquis  5.  Permanent A. Fib -Rate controlled on no AV blocking agents -Continue PTA Eliquis  6.  Pneumonia, COVID-19 positive -Antibiotics and management as per primary team      Signed, Kate Sable, MD  07/04/2019, 10:49 AM

## 2019-07-05 ENCOUNTER — Telehealth: Payer: Self-pay

## 2019-07-05 ENCOUNTER — Ambulatory Visit (HOSPITAL_COMMUNITY)
Admission: RE | Admit: 2019-07-05 | Discharge: 2019-07-05 | Disposition: A | Discharge status: Home / Self Care | Payer: 59 | Source: Ambulatory Visit | Attending: Pulmonary Disease | Admitting: Pulmonary Disease

## 2019-07-05 DIAGNOSIS — U071 COVID-19: Secondary | ICD-10-CM | POA: Diagnosis not present

## 2019-07-05 DIAGNOSIS — J1282 Pneumonia due to coronavirus disease 2019: Secondary | ICD-10-CM | POA: Insufficient documentation

## 2019-07-05 LAB — CULTURE, BLOOD (ROUTINE X 2)
Culture: NO GROWTH
Special Requests: ADEQUATE

## 2019-07-05 MED ORDER — FAMOTIDINE IN NACL 20-0.9 MG/50ML-% IV SOLN: 20.0000 mg | Freq: Once | INTRAVENOUS | Status: DC | PRN

## 2019-07-05 MED ORDER — ALBUTEROL SULFATE HFA 108 (90 BASE) MCG/ACT IN AERS: 2.0000 | INHALATION_SPRAY | Freq: Once | RESPIRATORY_TRACT | Status: DC | PRN

## 2019-07-05 MED ORDER — DIPHENHYDRAMINE HCL 50 MG/ML IJ SOLN: 50.0000 mg | Freq: Once | INTRAMUSCULAR | Status: DC | PRN

## 2019-07-05 MED ORDER — EPINEPHRINE 0.3 MG/0.3ML IJ SOAJ: 0.3000 mg | Freq: Once | INTRAMUSCULAR | Status: DC | PRN

## 2019-07-05 MED ORDER — SODIUM CHLORIDE 0.9 % IV SOLN
100.0000 mg | Freq: Once | INTRAVENOUS | Status: AC
  Administered 2019-07-05: 100 mg via INTRAVENOUS
  Filled 2019-07-05: qty 20

## 2019-07-05 MED ORDER — SODIUM CHLORIDE 0.9 % IV SOLN: INTRAVENOUS | Status: DC | PRN

## 2019-07-05 MED ORDER — METHYLPREDNISOLONE SODIUM SUCC 125 MG IJ SOLR: 125.0000 mg | Freq: Once | INTRAMUSCULAR | Status: DC | PRN

## 2019-07-05 NOTE — Progress Notes (Signed)
  Diagnosis: COVID-19  Physician: Dr. Joya Gaskins  Procedure: [facility] Med: remdesivir infusion - Provided patient with remdesivir fact sheet for patients, parents and caregivers prior to infusion.  Complications: No immediate complications noted.  Discharge: Discharged home   [name] 07/05/2019

## 2019-07-05 NOTE — Telephone Encounter (Signed)
NOted

## 2019-07-05 NOTE — Telephone Encounter (Signed)
Transition Care Management Follow-up Telephone Call  Date of discharge and from where: 07/04/2019, [facility]  How have you been since you were released from the hospital? Patient states that he is feeling better since his discharge.   Any questions or concerns? No   Items Reviewed:  Did the pt receive and understand the discharge instructions provided? Yes   Medications obtained and verified? Yes   Any new allergies since your discharge? No   Dietary orders reviewed? Yes  Do you have support at home? Yes   Functional Questionnaire: (I = Independent and D = Dependent) ADLs: I  Bathing/Dressing- I  Meal Prep- I  Eating- I  Maintaining continence- I  Transferring/Ambulation- I  Managing Meds- I  Follow up appointments reviewed:   PCP Hospital f/u appt confirmed? Yes  Scheduled to see Dr. Silvio Pate on 07/07/2019 @ 9:30 am virtually.  [facility] f/u appt confirmed? Yes  Scheduled to see cardiology   Are transportation arrangements needed? No   If their condition worsens, is the pt aware to call PCP or go to the Emergency Dept.? Yes  Was the patient provided with contact information for the PCP's office or ED? Yes  Was to pt encouraged to call back with questions or concerns? Yes

## 2019-07-06 ENCOUNTER — Other Ambulatory Visit: Payer: Self-pay | Admitting: *Deleted

## 2019-07-06 LAB — FUNGITELL, SERUM: Fungitell Result: <31 pg/mL (ref <80)

## 2019-07-06 NOTE — Patient Outreach (Signed)
[facility]) Care Management  07/06/2019  [name] Aug 09, 1960 [id number]  Transition of care telephone call  Referral received:07/02/19 Initial outreach:07/06/19 Insurance: [facility]   Initial unsuccessful telephone call to patient's preferred number in order to complete transition of care assessment; no answer, left HIPAA compliant voicemail message requesting return call.   Objective: [name]  was hospitalized at [facility] from 4/7-4/11/21 for Pneumonia, Sepsis with acute hypoxic respiratory failure, Covid 19/ Comorbidities include: Permanent Atrial Fib, Acute on chronic combined systolic diastolic heart failure ,  Diabetes, ESRD, s/p renal transplant 2012, GERD,  He was discharged to home on 07/04/19 without the need for home health services or DME. Patient final Remdesivir infusion outpatient on 07/05/19.   Plan: This RNCM will route unsuccessful outreach letter with [place] Management pamphlet and 24 hour Nurse Advice Line Magnet to [facility] Management clinical pool to be mailed to patient's home address. This RNCM will attempt another outreach within 4 business days.  Joylene Draft, RN, BSN  [place] Management Coordinator  [phone]- Mobile [phone]- Toll Free Main Office

## 2019-07-07 ENCOUNTER — Telehealth (INDEPENDENT_AMBULATORY_CARE_PROVIDER_SITE_OTHER): Payer: 59 | Admitting: Internal Medicine

## 2019-07-07 ENCOUNTER — Other Ambulatory Visit: Payer: Self-pay

## 2019-07-07 ENCOUNTER — Encounter: Payer: Self-pay | Admitting: Internal Medicine

## 2019-07-07 DIAGNOSIS — I5032 Chronic diastolic (congestive) heart failure: Secondary | ICD-10-CM | POA: Diagnosis not present

## 2019-07-07 DIAGNOSIS — I2510 Atherosclerotic heart disease of native coronary artery without angina pectoris: Secondary | ICD-10-CM

## 2019-07-07 DIAGNOSIS — I252 Old myocardial infarction: Secondary | ICD-10-CM

## 2019-07-07 NOTE — Assessment & Plan Note (Signed)
COVID positive and typical multilobar pneumonia seen on CT scan Did get rocephin in the hospital Remdesevir and IV steroids as well Home for a few days and feels good---just some mild fatigue Likely better since he got his first Moderna vaccine before the symptoms  Discussed quarantine till next week---already symptoms are gone Urged him to get the second Moderna vaccine ---probably can go next week if he can get appt

## 2019-07-07 NOTE — Assessment & Plan Note (Signed)
Not exacerbated--actually weight is down some! Continue current diuretics and other medicaitons

## 2019-07-07 NOTE — Progress Notes (Signed)
Subjective:    Patient ID: Carl Esias Mory., male    DOB: 03-12-1961, 59 y.o.   MRN: [id number]  HPI Virtual visit for hospital follow up ---COVID pneumonia Identification done Reviewed billing and he gave consent Participants-- patient at home and I am in my office  Reviewed all hospital records and discharge summary Went to hospital due to SOB--fatigue has started days earlier CXR was equivical--couldn't tell if fluid or pneumonia--CT confirmed typical multilobar pneumonia COVID positive Got antibiotics also remdesevir 4/5 in hospital--last given outpatient Got steroids in hospital--IV  CHF hadn't acted up Has actually lost weight  Now feeling better Working on his fishing poles and hopes to go fishing this afternoon! Did have some fatigue after walking around his shop for 2 hours yesterday Only occasional cough  Current Outpatient Medications on File Prior to Visit  Medication Sig Dispense Refill  . amLODipine (NORVASC) 5 MG tablet Take 1 tablet (5 mg total) by mouth daily. 90 tablet 2  . apixaban (ELIQUIS) 5 MG TABS tablet Take 1 tablet (5 mg total) by mouth 2 (two) times daily. 180 tablet 1  . beclomethasone (QVAR) 80 MCG/ACT inhaler Inhale 2 puffs into the lungs 2 (two) times daily.    Marland Kitchen buPROPion (WELLBUTRIN SR) 150 MG 12 hr tablet TAKE 1 TABLET BY MOUTH TWICE DAILY (Patient taking differently: Take 150 mg by mouth 2 (two) times daily. ) 180 tablet 3  . colchicine 0.6 MG tablet Take 1 tablet (0.6 mg total) by mouth daily. (Patient taking differently: Take 0.6 mg by mouth daily as needed (gout flare). ) 60 tablet 11  . dapagliflozin propanediol (FARXIGA) 10 MG TABS tablet Take 10 mg by mouth daily before breakfast. 30 tablet 6  . ferrous sulfate 325 (65 FE) MG EC tablet Take 650 mg by mouth 2 (two) times daily.     . fluticasone (FLONASE) 50 MCG/ACT nasal spray PLACE 2 SPRAYS INTO BOTH NOSTRILS DAILY. 16 g 11  . gabapentin (NEURONTIN) 300 MG capsule Take 300 mg by  mouth at bedtime.     Marland Kitchen HUMALOG KWIKPEN 100 UNIT/ML KwikPen Inject 5-10 Units into the skin 3 (three) times daily. Sliding scale    . insulin glargine (LANTUS) 100 unit/mL SOPN Inject 0.1 mLs (10 Units total) into the skin at bedtime. Currently using 15 units (Patient taking differently: Inject 28 Units into the skin at bedtime. ) 15 mL 11  . lisinopril (ZESTRIL) 40 MG tablet Take 0.5 tablets (20 mg total) by mouth daily. 90 tablet 3  . metolazone (ZAROXOLYN) 2.5 MG tablet Take 2.5 mg by mouth daily as needed (fluid retention).     . montelukast (SINGULAIR) 10 MG tablet TAKE 1 TABLET BY MOUTH AT BEDTIME (Patient taking differently: Take 10 mg by mouth at bedtime. ) 30 tablet 0  . Multiple Vitamin (MULTIVITAMIN WITH MINERALS) TABS tablet Take 1 tablet by mouth daily.    . mycophenolate (MYFORTIC) 180 MG EC tablet Take 360 mg by mouth 2 (two) times daily.     Marland Kitchen omeprazole (PRILOSEC) 20 MG capsule TAKE 1 CAPSULE BY MOUTH ONCE DAILY (Patient taking differently: Take 20 mg by mouth daily. ) 90 capsule 3  . potassium chloride (MICRO-K) 10 MEQ CR capsule Take 4 capsules (40 mEq total) by mouth every morning AND 2 capsules (20 mEq total) every evening. 360 capsule 4  . rosuvastatin (CRESTOR) 10 MG tablet TAKE 1 TABLET BY MOUTH DAILY (Patient taking differently: Take 10 mg by mouth daily. ) 90 tablet  3  . spironolactone (ALDACTONE) 25 MG tablet Take 25 mg by mouth daily.    . tacrolimus (PROGRAF) 1 MG capsule Take 1 mg by mouth 2 (two) times daily.    . tadalafil (CIALIS) 20 MG tablet Take 10-20 mg by mouth every other day as needed for erectile dysfunction.    . tamsulosin (FLOMAX) 0.4 MG CAPS capsule Take 0.4 mg by mouth daily.     Marland Kitchen torsemide (DEMADEX) 20 MG tablet Take torsemide 80 mg in am and 40 mg in pm (Patient taking differently: Take torsemide 80 mg in am and 40 mg in pm) 210 tablet 3  . traMADol (ULTRAM) 50 MG tablet Take 1 tablet (50 mg total) by mouth 3 (three) times daily as needed. (Patient  taking differently: Take 50 mg by mouth 3 (three) times daily as needed for moderate pain. ) 20 tablet 0  . vitamin C (ASCORBIC ACID) 250 MG tablet Take 1 tablet (250 mg total) by mouth daily. 30 tablet 0  . zinc sulfate 220 (50 Zn) MG capsule Take 1 capsule (220 mg total) by mouth 2 (two) times daily. 60 capsule 0   No current facility-administered medications on file prior to visit.    Allergies  Allergen Reactions  . Morphine Itching  . Acetaminophen Other (See Comments)    BC of transplant  . Ibuprofen Other (See Comments)    Due to kidney transplant    Past Medical History:  Diagnosis Date  . Allergy    seasonal  . Amnestic MCI (mild cognitive impairment with memory loss)   . Asthma   . Benign neoplasm of colon   . CAD (coronary artery disease) 2005   a.  Status post four-vessel CABG in 2005; b. 03/2018 MV: Small, fixed inferoapical and apical septal defect. No ischemia. EF 47% (60-65% by 05/2018 Echo).  . Chronic atrial fibrillation ([place])    a. CHADS2VASc 4 (CHF, HTN, DM, vascular disease)--> Eliquis; b. 09/2018 Zio: Chronic Afib.  . Chronic combined systolic and diastolic CHF (congestive heart failure) ([place])    a.  7/18 Echo: EF 45-50%; b. 05/2018 Echo: EF 60-65%, RVSP 74.8; c. 12/2018 Echo: EF 50-55%. Sev dil LA.  Marland Kitchen Chronic venous stasis dermatitis of both lower extremities   . Cytomegaloviral disease ([place]) 2017  . Depression   . Diabetes mellitus   . Diverticulosis   . Dyspnea   . Erectile dysfunction   . ESRD (end stage renal disease) ([place]) 2005   a. HD 2004-2012; b. S/p renal transplant in 2012  . GERD (gastroesophageal reflux disease)   . Gout   . Hyperlipidemia    low since renal failure  . Hypertension   . Kidney transplant status, cadaveric 2012  . Obesity   . PAH (pulmonary artery hypertension) ([place])    a. 05/2018 Echo: RVSP 74.75mHg; b. 12/2018 RV not well visualized.  . Pneumonia   . [place]   . Sleep apnea    bipap with oxygen 2 l  . Trigger  finger   . Ulcer 05/2016   Left shin  . Wears glasses     Past Surgical History:  Procedure Laterality Date  . BARIATRIC SURGERY    . CARDIAC CATHETERIZATION     [facility]  . CARDIAC CATHETERIZATION  05/03/2019  . CATARACT EXTRACTION W/PHACO Right 10/29/2017   Procedure: CATARACT EXTRACTION PHACO AND INTRAOCULAR LENS PLACEMENT ([place]  RIGHT DIABETIC;  Surgeon: BLeandrew Koyanagi MD;  [place]  Service: Ophthalmology;  Laterality: Right;  Diabetic - insulin  . CATARACT EXTRACTION W/PHACO Left 11/18/2017   Procedure: CATARACT EXTRACTION PHACO AND INTRAOCULAR LENS PLACEMENT ([place]) LEFT IVA/TOPICAL;  Surgeon: [name], MD;  Location: [place];  Service: Ophthalmology;  Laterality: Left;  DIABETES - insulin sleep apnea  . COLONOSCOPY WITH PROPOFOL N/A 04/22/2013   Procedure: COLONOSCOPY WITH PROPOFOL;  Surgeon: [name], MD;  Location: [facility];  Service: Endoscopy;  Laterality: N/A;  . CORONARY ARTERY BYPASS GRAFT  2005   X 4  . DG ANGIO AV SHUNT*L*     right and left upper arms  . FASCIOTOMY  03/03/2012   Procedure: FASCIOTOMY;  Surgeon: [name], MD;  Location: [place];  Service: Orthopedics;  Laterality: Right;  FASCIOTOMY RIGHT SMALL FINGER  . FASCIOTOMY Left 08/17/2013   Procedure: FASCIOTOMY LEFT RING;  Surgeon: [name], MD;  Location: [place];  Service: Orthopedics;  Laterality: Left;  . INCISION AND DRAINAGE ABSCESS Left 10/15/2015   Procedure: INCISION AND DRAINAGE ABSCESS;  Surgeon: [name], MD;  Location: [facility];  Service: General;  Laterality: Left;  . KIDNEY TRANSPLANT  09/13/2010   cadaver--at [facility]  . RIGHT HEART CATH N/A 11/15/2016   Procedure: RIGHT HEART CATH;  Surgeon: [name], MD;  Location: [place] CV LAB;  Service: Cardiovascular;  Laterality: N/A;  . RIGHT HEART CATH N/A 05/03/2019   Procedure: RIGHT HEART CATH;  Surgeon: [name], MD;   Location: [place] CV LAB;  Service: Cardiovascular;  Laterality: N/A;  . TYMPANIC MEMBRANE REPAIR  1/12   left  . VASECTOMY      Family History  Problem Relation Age of Onset  . Heart disease Father   . Kidney failure Father   . Kidney disease Father   . Diabetes Maternal Grandmother   . Breast cancer Maternal Grandmother   . Valvular heart disease Mother   . Liver cancer Paternal Uncle   . Liver cancer Paternal Grandmother   . Prostate cancer Neg Hx     Social History   Socioeconomic History  . Marital status: Married    Spouse name: Not on file  . Number of children: 2  . Years of education: Not on file  . Highest education level: Not on file  Occupational History  . Occupation: Academic librarian: unemployed    Comment: disabled due to kidney failure  Tobacco Use  . Smoking status: Former Smoker    Types: Cigars    Quit date: 09/02/1994    Years since quitting: 24.8  . Smokeless tobacco: Never Used  Substance and Sexual Activity  . Alcohol use: Yes    Alcohol/week: 0.0 - 1.0 standard drinks    Comment: occasional- 3 in the past year  . Drug use: No  . Sexual activity: Not on file  Other Topics Concern  . Not on file  Social History Narrative   In school for culinary arts      Has living will   Wife is health care POA   Would accept resuscitation attempts   Hasn't considered tube feedings   Social Determinants of Health   Financial Resource Strain:   . Difficulty of Paying Living Expenses:   Food Insecurity:   . Worried About Charity fundraiser in the Last Year:   . Arboriculturist in the Last Year:   Transportation Needs:   . Film/video editor (Medical):   Marland Kitchen Lack  of Transportation (Non-Medical):   Physical Activity:   . Days of Exercise per Week:   . Minutes of Exercise per Session:   Stress:   . Feeling of Stress :   Social Connections:   . Frequency of Communication with Friends and Family:   . Frequency of Social  Gatherings with Friends and Family:   . Attends Religious Services:   . Active Member of Clubs or Organizations:   . Attends Archivist Meetings:   Marland Kitchen Marital Status:   Intimate Partner Violence:   . Fear of Current or Ex-Partner:   . Emotionally Abused:   Marland Kitchen Physically Abused:   . Sexually Abused:    Review of Systems Sleep is off some---not tired now though Appetite is good No N/V Starting to regain sense of taste and smell Arthritis in hands is worse---will reschedule with Dr Meda Coffee    Objective:   Physical Exam  Constitutional:  Looks great!!  Respiratory: No respiratory distress.  Psychiatric: He has a normal mood and affect. His behavior is normal.           Assessment & Plan:

## 2019-07-08 ENCOUNTER — Other Ambulatory Visit: Payer: Self-pay | Admitting: *Deleted

## 2019-07-08 ENCOUNTER — Telehealth: Payer: Self-pay | Admitting: *Deleted

## 2019-07-08 MED ORDER — BENZONATATE 200 MG PO CAPS: 200.0000 mg | ORAL_CAPSULE | Freq: Three times a day (TID) | ORAL | 0 refills | Status: DC | PRN

## 2019-07-08 NOTE — Telephone Encounter (Signed)
Let him know I sent the prescription. It is one that isn't sedating so can be taken in the day also If it doesn't help, we can switch to a narcotic cough syrup

## 2019-07-08 NOTE — Telephone Encounter (Signed)
Spoke to pt.

## 2019-07-08 NOTE — Telephone Encounter (Signed)
Pt called and requested a Rx be sent in for some cough syrup Pt tested positive for Covid on 07/01/19 and has been coughing. No other symptoms. Cough is dry, worse at night. Little chest tightness. Pt denies chest pain or wheezing.   Requesting Rx for cough syrup be sent to [facility]

## 2019-07-08 NOTE — Patient Outreach (Signed)
[facility]) Care Management  07/08/2019  [name] [birth year] [id number]   Red on EMMI Alert   Day #1 Date: 07/06/19 Red Alert Reason : Other questions problems- "Yes"    Outreach Attempt #1 Subjective  Successful outreach call to patient, explained reason for the call and HIPPA confirmed x 2 identifiers.  Addressed EMMI automated discharge call, patient discussed the only thing he could think of was that he was questioning, when he could get 2nd covid vaccine after recently being hospitalized for Pneumonia, Covid 19. He states that he discussed it at recent visit with PCP when to get covid vaccine and how long of quarantine. Reviewed with patient MD recommendations of getting 2nd vaccine and per note probably next week after quarantine completed on next week. Reviewed AVS information on quarantine guideline to be  at least 10 days   pass when Symptoms first  appeared and 24 hours has passed since no fever without using fever reducing medications and there is improvement in symptoms of cough and shortness of breath, reviewed instructions included in discharge  after visit summary.  Patient reports feeling tired, some shortness of breath with activity, cough is nonproductive  and on the other hand having lots of energy. He discussed going fishing the other day, he was alone, no one around him. He also reported moving the yard on yesterday. Discussed importance of balancing rest with activity, eating balanced meals and staying within daily fluid balance . He reports monitoring temperature and no elevations. Reviewed staying safe with Covid 19 and quarantine recommendations  Patient states that he plans to call Dr. Silvio Pate office on today to ask about medication for cough.   Explained to patient that he may receive one additional automated voice responsive EMMI call following discharge.    Plan Will close case to EMMI , planned follow up call to patient on the  next day for Transition of care and follow up on if any additional questions.   Joylene Draft, RN, BSN  [place] Management Coordinator  [phone]- Mobile [phone]- Toll Free Main Office

## 2019-07-09 ENCOUNTER — Encounter: Payer: Self-pay | Admitting: *Deleted

## 2019-07-09 ENCOUNTER — Other Ambulatory Visit: Payer: Self-pay | Admitting: *Deleted

## 2019-07-09 ENCOUNTER — Telehealth: Payer: Self-pay | Admitting: Cardiovascular Disease

## 2019-07-09 NOTE — Patient Outreach (Signed)
[facility]) Care Management  07/09/2019  [name] 07-Jul-1960 [id number]   Transition of care call/case closure   Referral received:07/02/19 Initial outreach:07/06/19 Insurance: [employer]    Subjective: Initial successful telephone call to patient's preferred number in order to complete transition of care assessment; 2 HIPAA identifiers verified. Explained purpose of call and completed transition of care assessment.  [name] states that he is doing pretty good. He denies increased shortness of breath, no temperature elevation, oxygen saturation in 97% range. He report improvement in cough , since beginning on new medication after contact with PCP on yesterday. He reports appetite improving . He reports blood pressure running a little higher 140-150/70-80 encouraged to continue to monitor and have available at virtual visit .  He discussed scheduling virtual visit with [name] on next week.  He reports being independent with care at home.   Spouse is available to assist in care as needed . Patient states that he has an appointment on Monday with health coach with Active health management, chronic condition management program, due to chronic condition states of Heart failure ,  Diabetes , with recent A1c 10.3%, he also has an appointment with Dr.Solum in the next week,  need a referral to one of the [place] chronic disease management programs. Patient reports continuing to monitor daily weights, no sudden weight increases, no increased swelling and taking medications as prescribed.  He does not  have the hospital indemnity He  uses a [facility] outpatient pharmacy at [place]  Discussed with patient covid 19 quarantine instructions and end date, reviewed PCP recommendations of 2nd covid vaccine. .    Objective:  [name] was hospitalized [facility] from 4/7-4/11/21 for Pneumonia, Sepsis with acute hypoxic respiratory  failure, Covid 19/ Comorbidities include: Permanent Atrial Fib, Acute on chronic combined systolic diastolic heart failure ,  Diabetes, ESRD, s/p renal transplant 2012, GERD,  He was discharged to home on 07/04/19 without the need for home health servicesor DME. Patient final Remdesivir infusion outpatient on 07/05/19.  Assessment:  Patient voices good understanding of all discharge instructions.  See transition of care flowsheet for assessment details.   Plan:  Reviewed hospital discharge diagnosis of Pneumonia covid 19   and discharge treatment plan using hospital discharge instructions, assessing medication adherence, reviewing problems requiring provider notification, and discussing the importance of follow up with surgeon, primary care provider and/or specialists as directed. Using [place] website, noted previous referral , site review reads unable to reach, but affirms initial visit on 4/19  To participate in [facility] chronic disease management program. Patient requested information on timing of getting 2nd covid vaccine after having covid 19, sent to his personal [email or web address] information on covid vaccine on [place] Covid Vaccine frequently asked question list.     Patient agreeable to follow up call in the next week to assess for additional care coordination needs. Will  route successful outreach letter with [place] Management pamphlet and 24 Hour Nurse Line Magnet to [place] Management clinical pool to be mailed to patient's home address.  Thanked patient for their services to [facility].  Joylene Draft, RN, BSN  [place] Management Coordinator  [phone]- Mobile [phone]- Toll Free Main Office

## 2019-07-09 NOTE — Telephone Encounter (Signed)
  Patient Consent for Virtual Visit         [name]. has provided verbal consent on 07/09/2019 for a virtual visit (video or telephone).   CONSENT FOR VIRTUAL VISIT FOR:  [name].  By participating in this virtual visit I agree to the following:  I hereby voluntarily request, consent and authorize [place] and its employed or contracted physicians, physician assistants, nurse practitioners or other licensed health care professionals (the Practitioner), to provide me with telemedicine health care services (the "Services") as deemed necessary by the treating Practitioner. I acknowledge and consent to receive the Services by the Practitioner via telemedicine. I understand that the telemedicine visit will involve communicating with the Practitioner through live audiovisual communication technology and the disclosure of certain medical information by electronic transmission. I acknowledge that I have been given the opportunity to request an in-person assessment or other available alternative prior to the telemedicine visit and am voluntarily participating in the telemedicine visit.  I understand that I have the right to withhold or withdraw my consent to the use of telemedicine in the course of my care at any time, without affecting my right to future care or treatment, and that the Practitioner or I may terminate the telemedicine visit at any time. I understand that I have the right to inspect all information obtained and/or recorded in the course of the telemedicine visit and may receive copies of available information for a reasonable fee.  I understand that some of the potential risks of receiving the Services via telemedicine include:  Marland Kitchen Delay or interruption in medical evaluation due to technological equipment failure or disruption; . Information transmitted may not be sufficient (e.g. poor resolution of images) to allow for appropriate medical decision making by  the Practitioner; and/or  . In rare instances, security protocols could fail, causing a breach of personal health information.  Furthermore, I acknowledge that it is my responsibility to provide information about my medical history, conditions and care that is complete and accurate to the best of my ability. I acknowledge that Practitioner's advice, recommendations, and/or decision may be based on factors not within their control, such as incomplete or inaccurate data provided by me or distortions of diagnostic images or specimens that may result from electronic transmissions. I understand that the practice of medicine is not an exact science and that Practitioner makes no warranties or guarantees regarding treatment outcomes. I acknowledge that a copy of this consent can be made available to me via my patient portal ([place]), or I can request a printed copy by calling the office of [facility].    I understand that my insurance will be billed for this visit.   I have read or had this consent read to me. . I understand the contents of this consent, which adequately explains the benefits and risks of the Services being provided via telemedicine.  . I have been provided ample opportunity to ask questions regarding this consent and the Services and have had my questions answered to my satisfaction. . I give my informed consent for the services to be provided through the use of telemedicine in my medical care

## 2019-07-12 ENCOUNTER — Emergency Department: Payer: 59

## 2019-07-12 ENCOUNTER — Telehealth (INDEPENDENT_AMBULATORY_CARE_PROVIDER_SITE_OTHER): Payer: 59 | Admitting: Family

## 2019-07-12 ENCOUNTER — Telehealth: Payer: Self-pay | Admitting: *Deleted

## 2019-07-12 ENCOUNTER — Telehealth: Payer: Self-pay | Admitting: Physician Assistant

## 2019-07-12 ENCOUNTER — Encounter: Payer: Self-pay | Admitting: Physician Assistant

## 2019-07-12 ENCOUNTER — Inpatient Hospital Stay
Admission: EM | Admit: 2019-07-12 | Discharge: 2019-07-18 | DRG: 314 | Disposition: A | Discharge status: Home Health | Payer: 59 | Attending: Internal Medicine | Admitting: Internal Medicine

## 2019-07-12 ENCOUNTER — Other Ambulatory Visit: Payer: Self-pay

## 2019-07-12 ENCOUNTER — Telehealth: Payer: Self-pay

## 2019-07-12 VITALS — BP 130/60 | HR 72 | Ht 70.5 in | Wt 244.0 lb

## 2019-07-12 DIAGNOSIS — R0902 Hypoxemia: Secondary | ICD-10-CM | POA: Diagnosis not present

## 2019-07-12 DIAGNOSIS — R0789 Other chest pain: Secondary | ICD-10-CM | POA: Diagnosis not present

## 2019-07-12 DIAGNOSIS — Z951 Presence of aortocoronary bypass graft: Secondary | ICD-10-CM

## 2019-07-12 DIAGNOSIS — Z8616 Personal history of COVID-19: Secondary | ICD-10-CM | POA: Diagnosis not present

## 2019-07-12 DIAGNOSIS — E861 Hypovolemia: Secondary | ICD-10-CM | POA: Diagnosis not present

## 2019-07-12 DIAGNOSIS — I4821 Permanent atrial fibrillation: Secondary | ICD-10-CM | POA: Diagnosis present

## 2019-07-12 DIAGNOSIS — D631 Anemia in chronic kidney disease: Secondary | ICD-10-CM | POA: Diagnosis not present

## 2019-07-12 DIAGNOSIS — M129 Arthropathy, unspecified: Secondary | ICD-10-CM | POA: Diagnosis not present

## 2019-07-12 DIAGNOSIS — Z9841 Cataract extraction status, right eye: Secondary | ICD-10-CM

## 2019-07-12 DIAGNOSIS — Y830 Surgical operation with transplant of whole organ as the cause of abnormal reaction of the patient, or of later complication, without mention of misadventure at the time of the procedure: Secondary | ICD-10-CM | POA: Diagnosis not present

## 2019-07-12 DIAGNOSIS — Z79899 Other long term (current) drug therapy: Secondary | ICD-10-CM | POA: Diagnosis not present

## 2019-07-12 DIAGNOSIS — Z841 Family history of disorders of kidney and ureter: Secondary | ICD-10-CM

## 2019-07-12 DIAGNOSIS — T8619 Other complication of kidney transplant: Secondary | ICD-10-CM | POA: Diagnosis not present

## 2019-07-12 DIAGNOSIS — Z940 Kidney transplant status: Secondary | ICD-10-CM | POA: Diagnosis not present

## 2019-07-12 DIAGNOSIS — Z9842 Cataract extraction status, left eye: Secondary | ICD-10-CM

## 2019-07-12 DIAGNOSIS — Z794 Long term (current) use of insulin: Secondary | ICD-10-CM

## 2019-07-12 DIAGNOSIS — N179 Acute kidney failure, unspecified: Secondary | ICD-10-CM | POA: Diagnosis not present

## 2019-07-12 DIAGNOSIS — J9601 Acute respiratory failure with hypoxia: Secondary | ICD-10-CM | POA: Diagnosis not present

## 2019-07-12 DIAGNOSIS — M25561 Pain in right knee: Secondary | ICD-10-CM | POA: Diagnosis not present

## 2019-07-12 DIAGNOSIS — M064 Inflammatory polyarthropathy: Secondary | ICD-10-CM | POA: Diagnosis present

## 2019-07-12 DIAGNOSIS — Z7901 Long term (current) use of anticoagulants: Secondary | ICD-10-CM

## 2019-07-12 DIAGNOSIS — M25532 Pain in left wrist: Secondary | ICD-10-CM | POA: Diagnosis not present

## 2019-07-12 DIAGNOSIS — E1122 Type 2 diabetes mellitus with diabetic chronic kidney disease: Secondary | ICD-10-CM | POA: Diagnosis present

## 2019-07-12 DIAGNOSIS — I82619 Acute embolism and thrombosis of superficial veins of unspecified upper extremity: Secondary | ICD-10-CM | POA: Diagnosis not present

## 2019-07-12 DIAGNOSIS — Z91041 Radiographic dye allergy status: Secondary | ICD-10-CM | POA: Diagnosis not present

## 2019-07-12 DIAGNOSIS — I5032 Chronic diastolic (congestive) heart failure: Secondary | ICD-10-CM | POA: Diagnosis present

## 2019-07-12 DIAGNOSIS — Y832 Surgical operation with anastomosis, bypass or graft as the cause of abnormal reaction of the patient, or of later complication, without mention of misadventure at the time of the procedure: Secondary | ICD-10-CM | POA: Diagnosis present

## 2019-07-12 DIAGNOSIS — E785 Hyperlipidemia, unspecified: Secondary | ICD-10-CM | POA: Diagnosis present

## 2019-07-12 DIAGNOSIS — Z800 Family history of malignant neoplasm of digestive organs: Secondary | ICD-10-CM

## 2019-07-12 DIAGNOSIS — M06072 Rheumatoid arthritis without rheumatoid factor, left ankle and foot: Secondary | ICD-10-CM | POA: Diagnosis not present

## 2019-07-12 DIAGNOSIS — J45909 Unspecified asthma, uncomplicated: Secondary | ICD-10-CM | POA: Diagnosis present

## 2019-07-12 DIAGNOSIS — K219 Gastro-esophageal reflux disease without esophagitis: Secondary | ICD-10-CM | POA: Diagnosis present

## 2019-07-12 DIAGNOSIS — N1831 Chronic kidney disease, stage 3a: Secondary | ICD-10-CM | POA: Diagnosis present

## 2019-07-12 DIAGNOSIS — M0689 Other specified rheumatoid arthritis, multiple sites: Secondary | ICD-10-CM | POA: Diagnosis not present

## 2019-07-12 DIAGNOSIS — E1165 Type 2 diabetes mellitus with hyperglycemia: Secondary | ICD-10-CM | POA: Diagnosis not present

## 2019-07-12 DIAGNOSIS — N400 Enlarged prostate without lower urinary tract symptoms: Secondary | ICD-10-CM | POA: Diagnosis present

## 2019-07-12 DIAGNOSIS — E871 Hypo-osmolality and hyponatremia: Secondary | ICD-10-CM | POA: Diagnosis not present

## 2019-07-12 DIAGNOSIS — I5042 Chronic combined systolic (congestive) and diastolic (congestive) heart failure: Secondary | ICD-10-CM | POA: Diagnosis not present

## 2019-07-12 DIAGNOSIS — M79602 Pain in left arm: Secondary | ICD-10-CM | POA: Diagnosis not present

## 2019-07-12 DIAGNOSIS — T82898A Other specified complication of vascular prosthetic devices, implants and grafts, initial encounter: Secondary | ICD-10-CM | POA: Diagnosis not present

## 2019-07-12 DIAGNOSIS — Z886 Allergy status to analgesic agent status: Secondary | ICD-10-CM

## 2019-07-12 DIAGNOSIS — D849 Immunodeficiency, unspecified: Secondary | ICD-10-CM | POA: Diagnosis not present

## 2019-07-12 DIAGNOSIS — I10 Essential (primary) hypertension: Secondary | ICD-10-CM | POA: Diagnosis not present

## 2019-07-12 DIAGNOSIS — N039 Chronic nephritic syndrome with unspecified morphologic changes: Secondary | ICD-10-CM | POA: Diagnosis present

## 2019-07-12 DIAGNOSIS — M1000 Idiopathic gout, unspecified site: Secondary | ICD-10-CM | POA: Diagnosis not present

## 2019-07-12 DIAGNOSIS — N170 Acute kidney failure with tubular necrosis: Secondary | ICD-10-CM | POA: Diagnosis not present

## 2019-07-12 DIAGNOSIS — M069 Rheumatoid arthritis, unspecified: Secondary | ICD-10-CM | POA: Diagnosis not present

## 2019-07-12 DIAGNOSIS — Z8701 Personal history of pneumonia (recurrent): Secondary | ICD-10-CM | POA: Diagnosis not present

## 2019-07-12 DIAGNOSIS — M109 Gout, unspecified: Secondary | ICD-10-CM | POA: Diagnosis present

## 2019-07-12 DIAGNOSIS — I429 Cardiomyopathy, unspecified: Secondary | ICD-10-CM | POA: Diagnosis present

## 2019-07-12 DIAGNOSIS — M79622 Pain in left upper arm: Secondary | ICD-10-CM | POA: Diagnosis not present

## 2019-07-12 DIAGNOSIS — M12871 Other specific arthropathies, not elsewhere classified, right ankle and foot: Secondary | ICD-10-CM | POA: Diagnosis not present

## 2019-07-12 DIAGNOSIS — M06012 Rheumatoid arthritis without rheumatoid factor, left shoulder: Secondary | ICD-10-CM | POA: Diagnosis not present

## 2019-07-12 DIAGNOSIS — R509 Fever, unspecified: Secondary | ICD-10-CM | POA: Diagnosis not present

## 2019-07-12 DIAGNOSIS — Z961 Presence of intraocular lens: Secondary | ICD-10-CM | POA: Diagnosis present

## 2019-07-12 DIAGNOSIS — I482 Chronic atrial fibrillation, unspecified: Secondary | ICD-10-CM

## 2019-07-12 DIAGNOSIS — Z888 Allergy status to other drugs, medicaments and biological substances status: Secondary | ICD-10-CM | POA: Diagnosis not present

## 2019-07-12 DIAGNOSIS — Z8249 Family history of ischemic heart disease and other diseases of the circulatory system: Secondary | ICD-10-CM

## 2019-07-12 DIAGNOSIS — Z87891 Personal history of nicotine dependence: Secondary | ICD-10-CM

## 2019-07-12 DIAGNOSIS — M25462 Effusion, left knee: Secondary | ICD-10-CM | POA: Diagnosis present

## 2019-07-12 DIAGNOSIS — I2510 Atherosclerotic heart disease of native coronary artery without angina pectoris: Secondary | ICD-10-CM | POA: Diagnosis not present

## 2019-07-12 DIAGNOSIS — G4730 Sleep apnea, unspecified: Secondary | ICD-10-CM | POA: Diagnosis present

## 2019-07-12 DIAGNOSIS — R5081 Fever presenting with conditions classified elsewhere: Secondary | ICD-10-CM | POA: Diagnosis not present

## 2019-07-12 DIAGNOSIS — M06032 Rheumatoid arthritis without rheumatoid factor, left wrist: Secondary | ICD-10-CM | POA: Diagnosis not present

## 2019-07-12 DIAGNOSIS — M06071 Rheumatoid arthritis without rheumatoid factor, right ankle and foot: Secondary | ICD-10-CM | POA: Diagnosis not present

## 2019-07-12 DIAGNOSIS — D709 Neutropenia, unspecified: Secondary | ICD-10-CM | POA: Diagnosis not present

## 2019-07-12 DIAGNOSIS — T380X5A Adverse effect of glucocorticoids and synthetic analogues, initial encounter: Secondary | ICD-10-CM | POA: Diagnosis not present

## 2019-07-12 DIAGNOSIS — I130 Hypertensive heart and chronic kidney disease with heart failure and stage 1 through stage 4 chronic kidney disease, or unspecified chronic kidney disease: Secondary | ICD-10-CM | POA: Diagnosis present

## 2019-07-12 DIAGNOSIS — Z7951 Long term (current) use of inhaled steroids: Secondary | ICD-10-CM

## 2019-07-12 DIAGNOSIS — M06022 Rheumatoid arthritis without rheumatoid factor, left elbow: Secondary | ICD-10-CM | POA: Diagnosis not present

## 2019-07-12 DIAGNOSIS — Z79891 Long term (current) use of opiate analgesic: Secondary | ICD-10-CM

## 2019-07-12 DIAGNOSIS — R091 Pleurisy: Secondary | ICD-10-CM

## 2019-07-12 DIAGNOSIS — R079 Chest pain, unspecified: Secondary | ICD-10-CM | POA: Diagnosis not present

## 2019-07-12 DIAGNOSIS — R072 Precordial pain: Secondary | ICD-10-CM

## 2019-07-12 DIAGNOSIS — Z833 Family history of diabetes mellitus: Secondary | ICD-10-CM

## 2019-07-12 DIAGNOSIS — T502X5A Adverse effect of carbonic-anhydrase inhibitors, benzothiadiazides and other diuretics, initial encounter: Secondary | ICD-10-CM | POA: Diagnosis not present

## 2019-07-12 DIAGNOSIS — Y92239 Unspecified place in hospital as the place of occurrence of the external cause: Secondary | ICD-10-CM | POA: Diagnosis not present

## 2019-07-12 DIAGNOSIS — F329 Major depressive disorder, single episode, unspecified: Secondary | ICD-10-CM | POA: Diagnosis present

## 2019-07-12 DIAGNOSIS — I82612 Acute embolism and thrombosis of superficial veins of left upper extremity: Secondary | ICD-10-CM | POA: Diagnosis not present

## 2019-07-12 DIAGNOSIS — I2721 Secondary pulmonary arterial hypertension: Secondary | ICD-10-CM | POA: Diagnosis present

## 2019-07-12 DIAGNOSIS — Z803 Family history of malignant neoplasm of breast: Secondary | ICD-10-CM

## 2019-07-12 LAB — CBC
HCT: 37.3 % — ABNORMAL LOW (ref 39.0–52.0)
Hemoglobin: 11.5 g/dL — ABNORMAL LOW (ref 13.0–17.0)
MCH: 24.9 pg — ABNORMAL LOW (ref 26.0–34.0)
MCHC: 30.8 g/dL (ref 30.0–36.0)
MCV: 80.7 fL (ref 80.0–100.0)
Platelets: 148 10*3/uL — ABNORMAL LOW (ref 150–400)
RBC: 4.62 MIL/uL (ref 4.22–5.81)
RDW: 20.5 % — ABNORMAL HIGH (ref 11.5–15.5)
WBC: 8.5 10*3/uL (ref 4.0–10.5)
nRBC: 0 % (ref 0.0–0.2)

## 2019-07-12 LAB — BASIC METABOLIC PANEL
Anion gap: 11 (ref 5–15)
BUN: 23 mg/dL — ABNORMAL HIGH (ref 6–20)
CO2: 27 mmol/L (ref 22–32)
Calcium: 9.2 mg/dL (ref 8.9–10.3)
Chloride: 98 mmol/L (ref 98–111)
Creatinine, Ser: 1.4 mg/dL — ABNORMAL HIGH (ref 0.61–1.24)
GFR calc Af Amer: >60 mL/min (ref >60)
GFR calc non Af Amer: 55 mL/min — ABNORMAL LOW (ref >60)
Glucose, Bld: 202 mg/dL — ABNORMAL HIGH (ref 70–99)
Potassium: 5.3 mmol/L — ABNORMAL HIGH (ref 3.5–5.1)
Sodium: 136 mmol/L (ref 135–145)

## 2019-07-12 LAB — TROPONIN I (HIGH SENSITIVITY)
Troponin I (High Sensitivity): 20 ng/L — ABNORMAL HIGH (ref <18)
Troponin I (High Sensitivity): 20 ng/L — ABNORMAL HIGH (ref <18)

## 2019-07-12 LAB — PROTIME-INR
INR: 1.8 — ABNORMAL HIGH (ref 0.8–1.2)
Prothrombin Time: 21 seconds — ABNORMAL HIGH (ref 11.4–15.2)

## 2019-07-12 LAB — HEPARIN LEVEL (UNFRACTIONATED): Heparin Unfractionated: 1.71 IU/mL — ABNORMAL HIGH (ref 0.30–0.70)

## 2019-07-12 LAB — APTT: aPTT: 43 seconds — ABNORMAL HIGH (ref 24–36)

## 2019-07-12 MED ORDER — SPIRONOLACTONE 25 MG PO TABS: 25.0000 mg | ORAL_TABLET | Freq: Every day | ORAL | Status: DC

## 2019-07-12 MED ORDER — FLUTICASONE PROPIONATE 50 MCG/ACT NA SUSP: 2.0000 | Freq: Every day | NASAL | Status: DC

## 2019-07-12 MED ORDER — HYDROMORPHONE HCL 1 MG/ML IJ SOLN
0.5000 mg | INTRAMUSCULAR | Status: DC | PRN
  Administered 2019-07-12 – 2019-07-13 (×7): 0.5 mg via INTRAVENOUS
  Filled 2019-07-12 (×7): qty 1

## 2019-07-12 MED ORDER — ASCORBIC ACID 500 MG PO TABS
250.0000 mg | ORAL_TABLET | Freq: Every day | ORAL | Status: DC
  Administered 2019-07-13 – 2019-07-18 (×6): 250 mg via ORAL
  Filled 2019-07-12 (×6): qty 1

## 2019-07-12 MED ORDER — ONDANSETRON HCL 4 MG/2ML IJ SOLN: 4.0000 mg | Freq: Four times a day (QID) | INTRAMUSCULAR | Status: DC | PRN

## 2019-07-12 MED ORDER — IOHEXOL 350 MG/ML SOLN: 100.0000 mL | Freq: Once | INTRAVENOUS | Status: DC | PRN

## 2019-07-12 MED ORDER — INSULIN GLARGINE 100 UNITS/ML SOLOSTAR PEN: 28.0000 [IU] | PEN_INJECTOR | Freq: Every day | SUBCUTANEOUS | Status: DC

## 2019-07-12 MED ORDER — HEPARIN (PORCINE) 25000 UT/250ML-% IV SOLN
1600.0000 [IU]/h | INTRAVENOUS | Status: DC
  Administered 2019-07-12: 1550 [IU]/h via INTRAVENOUS
  Administered 2019-07-13: 1800 [IU]/h via INTRAVENOUS
  Filled 2019-07-12 (×2): qty 250

## 2019-07-12 MED ORDER — MONTELUKAST SODIUM 10 MG PO TABS
10.0000 mg | ORAL_TABLET | Freq: Every day | ORAL | Status: DC
  Administered 2019-07-13 – 2019-07-17 (×6): 10 mg via ORAL
  Filled 2019-07-12 (×7): qty 1

## 2019-07-12 MED ORDER — ZOLPIDEM TARTRATE 5 MG PO TABS: 5.0000 mg | ORAL_TABLET | Freq: Every evening | ORAL | Status: DC | PRN

## 2019-07-12 MED ORDER — LISINOPRIL 10 MG PO TABS: 20.0000 mg | ORAL_TABLET | Freq: Every day | ORAL | Status: DC

## 2019-07-12 MED ORDER — MYCOPHENOLATE SODIUM 180 MG PO TBEC
360.0000 mg | DELAYED_RELEASE_TABLET | Freq: Two times a day (BID) | ORAL | Status: DC
  Administered 2019-07-13 – 2019-07-18 (×12): 360 mg via ORAL
  Filled 2019-07-12 (×15): qty 2

## 2019-07-12 MED ORDER — TACROLIMUS 1 MG PO CAPS
1.0000 mg | ORAL_CAPSULE | Freq: Two times a day (BID) | ORAL | Status: DC
  Administered 2019-07-13 – 2019-07-16 (×8): 1 mg via ORAL
  Filled 2019-07-12 (×12): qty 1

## 2019-07-12 MED ORDER — COLCHICINE 0.6 MG PO TABS
0.6000 mg | ORAL_TABLET | Freq: Every day | ORAL | Status: DC | PRN
  Administered 2019-07-13 – 2019-07-14 (×2): 0.6 mg via ORAL
  Filled 2019-07-12 (×4): qty 1

## 2019-07-12 MED ORDER — PANTOPRAZOLE SODIUM 40 MG PO TBEC
40.0000 mg | DELAYED_RELEASE_TABLET | Freq: Every day | ORAL | Status: DC
  Administered 2019-07-13 – 2019-07-18 (×6): 40 mg via ORAL
  Filled 2019-07-12 (×6): qty 1

## 2019-07-12 MED ORDER — FUROSEMIDE 10 MG/ML IJ SOLN
40.0000 mg | Freq: Once | INTRAMUSCULAR | Status: AC
  Administered 2019-07-13: 40 mg via INTRAVENOUS
  Filled 2019-07-12: qty 4

## 2019-07-12 MED ORDER — ROSUVASTATIN CALCIUM 10 MG PO TABS
10.0000 mg | ORAL_TABLET | Freq: Every day | ORAL | Status: DC
  Administered 2019-07-13 – 2019-07-17 (×5): 10 mg via ORAL
  Filled 2019-07-12 (×6): qty 1

## 2019-07-12 MED ORDER — BENZONATATE 100 MG PO CAPS: 200.0000 mg | ORAL_CAPSULE | Freq: Three times a day (TID) | ORAL | Status: DC | PRN

## 2019-07-12 MED ORDER — TRAMADOL HCL 50 MG PO TABS
50.0000 mg | ORAL_TABLET | Freq: Three times a day (TID) | ORAL | Status: DC | PRN
  Administered 2019-07-14 – 2019-07-15 (×2): 50 mg via ORAL
  Filled 2019-07-12 (×2): qty 1

## 2019-07-12 MED ORDER — HEPARIN BOLUS VIA INFUSION
5500.0000 [IU] | Freq: Once | INTRAVENOUS | Status: DC
  Administered 2019-07-12: 5500 [IU] via INTRAVENOUS
  Filled 2019-07-12: qty 5500

## 2019-07-12 MED ORDER — SODIUM CHLORIDE 0.9 % IV SOLN: INTRAVENOUS | Status: DC

## 2019-07-12 MED ORDER — ZINC SULFATE 220 (50 ZN) MG PO CAPS
220.0000 mg | ORAL_CAPSULE | Freq: Two times a day (BID) | ORAL | Status: DC
  Administered 2019-07-13 – 2019-07-18 (×12): 220 mg via ORAL
  Filled 2019-07-12 (×12): qty 1

## 2019-07-12 MED ORDER — INSULIN GLARGINE 100 UNITS/ML SOLOSTAR PEN
12.0000 [IU] | PEN_INJECTOR | Freq: Every day | SUBCUTANEOUS | Status: DC
  Filled 2019-07-12: qty 3

## 2019-07-12 MED ORDER — MORPHINE SULFATE (PF) 4 MG/ML IV SOLN
4.0000 mg | Freq: Once | INTRAVENOUS | Status: AC
  Administered 2019-07-12: 4 mg via INTRAVENOUS
  Filled 2019-07-12: qty 1

## 2019-07-12 MED ORDER — MAGNESIUM HYDROXIDE 400 MG/5ML PO SUSP
30.0000 mL | Freq: Every day | ORAL | Status: DC | PRN
  Administered 2019-07-14: 22:00:00 30 mL via ORAL
  Filled 2019-07-12 (×2): qty 30

## 2019-07-12 MED ORDER — IOHEXOL 350 MG/ML SOLN: 75.0000 mL | Freq: Once | INTRAVENOUS | Status: DC | PRN

## 2019-07-12 MED ORDER — FERROUS SULFATE 325 (65 FE) MG PO TABS
650.0000 mg | ORAL_TABLET | Freq: Two times a day (BID) | ORAL | Status: DC
  Administered 2019-07-13 – 2019-07-18 (×12): 650 mg via ORAL
  Filled 2019-07-12 (×13): qty 2

## 2019-07-12 MED ORDER — TORSEMIDE 20 MG PO TABS: 40.0000 mg | ORAL_TABLET | Freq: Two times a day (BID) | ORAL | Status: DC

## 2019-07-12 MED ORDER — AMLODIPINE BESYLATE 5 MG PO TABS
5.0000 mg | ORAL_TABLET | Freq: Every day | ORAL | Status: DC
  Administered 2019-07-13 – 2019-07-18 (×5): 5 mg via ORAL
  Filled 2019-07-12 (×6): qty 1

## 2019-07-12 MED ORDER — OXYCODONE HCL 5 MG PO TABS
10.0000 mg | ORAL_TABLET | ORAL | Status: DC | PRN
  Administered 2019-07-12 – 2019-07-15 (×6): 10 mg via ORAL
  Filled 2019-07-12 (×6): qty 2

## 2019-07-12 MED ORDER — ADULT MULTIVITAMIN W/MINERALS CH
1.0000 | ORAL_TABLET | Freq: Every day | ORAL | Status: DC
  Administered 2019-07-13 – 2019-07-18 (×6): 1 via ORAL
  Filled 2019-07-12 (×6): qty 1

## 2019-07-12 MED ORDER — GABAPENTIN 300 MG PO CAPS
300.0000 mg | ORAL_CAPSULE | Freq: Every day | ORAL | Status: DC
  Administered 2019-07-13 – 2019-07-17 (×6): 300 mg via ORAL
  Filled 2019-07-12 (×6): qty 1

## 2019-07-12 MED ORDER — BUPROPION HCL ER (SR) 150 MG PO TB12
150.0000 mg | ORAL_TABLET | Freq: Two times a day (BID) | ORAL | Status: DC
  Administered 2019-07-13 – 2019-07-18 (×12): 150 mg via ORAL
  Filled 2019-07-12 (×15): qty 1

## 2019-07-12 MED ORDER — INSULIN ASPART 100 UNIT/ML ~~LOC~~ SOLN
0.0000 [IU] | Freq: Three times a day (TID) | SUBCUTANEOUS | Status: DC
  Administered 2019-07-13: 3 [IU] via SUBCUTANEOUS
  Administered 2019-07-14: 22:00:00 5 [IU] via SUBCUTANEOUS
  Administered 2019-07-14 (×3): 3 [IU] via SUBCUTANEOUS
  Administered 2019-07-15: 11 [IU] via SUBCUTANEOUS
  Administered 2019-07-15: 17:00:00 20 [IU] via SUBCUTANEOUS
  Administered 2019-07-15: 15 [IU] via SUBCUTANEOUS
  Filled 2019-07-12 (×8): qty 1

## 2019-07-12 MED ORDER — ONDANSETRON HCL 4 MG/2ML IJ SOLN
4.0000 mg | Freq: Once | INTRAMUSCULAR | Status: AC
  Administered 2019-07-12: 4 mg via INTRAVENOUS
  Filled 2019-07-12: qty 2

## 2019-07-12 MED ORDER — ONDANSETRON HCL 4 MG PO TABS: 4.0000 mg | ORAL_TABLET | Freq: Four times a day (QID) | ORAL | Status: DC | PRN

## 2019-07-12 MED ORDER — METOLAZONE 2.5 MG PO TABS
2.5000 mg | ORAL_TABLET | Freq: Every day | ORAL | Status: DC | PRN
  Filled 2019-07-12: qty 1

## 2019-07-12 MED ORDER — CANAGLIFLOZIN 100 MG PO TABS: 100.0000 mg | ORAL_TABLET | Freq: Every day | ORAL | Status: DC

## 2019-07-12 MED ORDER — BUDESONIDE 0.25 MG/2ML IN SUSP
2.0000 mL | Freq: Two times a day (BID) | RESPIRATORY_TRACT | Status: DC
  Administered 2019-07-13 (×2): 0.25 mg via RESPIRATORY_TRACT
  Filled 2019-07-12 (×2): qty 2

## 2019-07-12 MED ORDER — TAMSULOSIN HCL 0.4 MG PO CAPS
0.4000 mg | ORAL_CAPSULE | Freq: Every day | ORAL | Status: DC
  Administered 2019-07-13 – 2019-07-18 (×6): 0.4 mg via ORAL
  Filled 2019-07-12 (×6): qty 1

## 2019-07-12 MED ORDER — SODIUM CHLORIDE 0.9% FLUSH: 3.0000 mL | Freq: Once | INTRAVENOUS | Status: DC

## 2019-07-12 MED ORDER — INSULIN GLARGINE 100 UNIT/ML ~~LOC~~ SOLN
12.0000 [IU] | Freq: Every day | SUBCUTANEOUS | Status: DC
  Administered 2019-07-13 – 2019-07-15 (×4): 12 [IU] via SUBCUTANEOUS
  Filled 2019-07-12 (×7): qty 0.12

## 2019-07-12 NOTE — Telephone Encounter (Signed)
Patient was seen in [facility] Cardiology office today (in person) after virtual visit cancelled due to severity of pain reported. He was seen in the office with recommendation to present to the ED.

## 2019-07-12 NOTE — H&P (Signed)
[facility] [place] NAME: [name]    MR#:  [id number]  DATE OF BIRTH:  05/24/1960  DATE OF ADMISSION:  07/12/2019  PRIMARY CARE PHYSICIAN: [name], MD   REQUESTING/REFERRING PHYSICIAN: Devonne Doughty, MD  CHIEF COMPLAINT:   Chief Complaint  Patient presents with  . Weakness    HISTORY OF PRESENT ILLNESS:  [name]  is a 59 y.o. Hispanic male with a known history of multiple medical problems that are mentioned below, including CHF, end-stage renal disease status post renal transplant in 2012, dyslipidemia, asthma, coronary artery disease status post four-vessel CABG, type 2 diabetes mellitus and gout, presented to emergency room with acute onset of left arm anterior chest wall pain.  He denies any dyspnea.  He had cough last week that stopped.  No nausea or vomiting or diaphoresis.  No headache or dizziness or blurred vision.  No dysuria, oliguria or hematuria or flank pain.  Upon presentation to the emergency room, blood pressure was 130/67 with otherwise normal vital signs except for mild hypoxia with a pulse ox entry of 91% and came up to 98% on 2 L of O2 by nasal cannula.  Labs revealed potassium 5.3 with a blood glucose of 2 2, BUN of 23 and creatinine of 1.4 compared to 86 and 1.87 on 4/11.  High-sensitivity troponin I was 20 and later the same.  CBC showed anemia better than previous levels.  INR was 1.8 and PTT 21 PTT of 43.  Left upper extremity venous Doppler revealed occluded cephalic vein at the AV fistula outflow tract in the antecubital fossa with no other thrombus identified.  Chest x-ray showed Central pulmonary vascular congestion without definite pulmonary edema or consolidative process.   The patient was given IV heparin bolus and a drip, 0.5 mg IV Dilaudid, 4 mg IV morphine sulfate and 4 mg of IV Zofran and will be admitted to a progressive cardiac cath for further evaluation and management. PAST MEDICAL HISTORY:   Past  Medical History:  Diagnosis Date  . Allergy    seasonal  . Amnestic MCI (mild cognitive impairment with memory loss)   . Asthma   . Benign neoplasm of colon   . CAD (coronary artery disease) 2005   a.  Status post four-vessel CABG in 2005; b. 03/2018 MV: Small, fixed inferoapical and apical septal defect. No ischemia. EF 47% (60-65% by 05/2018 Echo).  . Chronic atrial fibrillation ([place])    a. CHADS2VASc 4 (CHF, HTN, DM, vascular disease)--> Eliquis; b. 09/2018 Zio: Chronic Afib.  . Chronic combined systolic and diastolic CHF (congestive heart failure) ([place])    a.  7/18 Echo: EF 45-50%; b. 05/2018 Echo: EF 60-65%, RVSP 74.8; c. 12/2018 Echo: EF 50-55%. Sev dil LA.  Marland Kitchen Chronic venous stasis dermatitis of both lower extremities   . Cytomegaloviral disease ([place]) 2017  . Depression   . Diabetes mellitus   . Diverticulosis   . Dyspnea   . Erectile dysfunction   . ESRD (end stage renal disease) ([place]) 2005   a. HD 2004-2012; b. S/p renal transplant in 2012  . GERD (gastroesophageal reflux disease)   . Gout   . Hyperlipidemia    low since renal failure  . Hypertension   . Kidney transplant status, cadaveric 2012  . Obesity   . PAH (pulmonary artery hypertension) ([place])    a. 05/2018 Echo: RVSP 74.22mHg; b. 12/2018 RV not well visualized.  . Pneumonia   . Purpura ([facility]   .  Sleep apnea    bipap with oxygen 2 l  . Trigger finger   . Ulcer 05/2016   Left shin  . Wears glasses     PAST SURGICAL HISTORY:   Past Surgical History:  Procedure Laterality Date  . BARIATRIC SURGERY    . CARDIAC CATHETERIZATION     [facility]  . CARDIAC CATHETERIZATION  05/03/2019  . CATARACT EXTRACTION W/PHACO Right 10/29/2017   Procedure: CATARACT EXTRACTION PHACO AND INTRAOCULAR LENS PLACEMENT ([place])  RIGHT DIABETIC;  Surgeon: [name], MD;  Location: [place];  Service: Ophthalmology;  Laterality: Right;  Diabetic - insulin  . CATARACT EXTRACTION W/PHACO Left 11/18/2017   Procedure: CATARACT  EXTRACTION PHACO AND INTRAOCULAR LENS PLACEMENT ([place]) LEFT IVA/TOPICAL;  Surgeon: [name], MD;  Location: [place];  Service: Ophthalmology;  Laterality: Left;  DIABETES - insulin sleep apnea  . COLONOSCOPY WITH PROPOFOL N/A 04/22/2013   Procedure: COLONOSCOPY WITH PROPOFOL;  Surgeon: [name], MD;  Location: [facility];  Service: Endoscopy;  Laterality: N/A;  . CORONARY ARTERY BYPASS GRAFT  2005   X 4  . DG ANGIO AV SHUNT*L*     right and left upper arms  . FASCIOTOMY  03/03/2012   Procedure: FASCIOTOMY;  Surgeon: [name], MD;  Location: [facility];  Service: Orthopedics;  Laterality: Right;  FASCIOTOMY RIGHT SMALL FINGER  . FASCIOTOMY Left 08/17/2013   Procedure: FASCIOTOMY LEFT RING;  Surgeon: [name], MD;  Location: [place];  Service: Orthopedics;  Laterality: Left;  . INCISION AND DRAINAGE ABSCESS Left 10/15/2015   Procedure: INCISION AND DRAINAGE ABSCESS;  Surgeon: [name], MD;  Location: [facility];  Service: General;  Laterality: Left;  . KIDNEY TRANSPLANT  09/13/2010   cadaver--at [facility]  . RIGHT HEART CATH N/A 11/15/2016   Procedure: RIGHT HEART CATH;  Surgeon: [name], MD;  Location: [place] [facility];  Service: Cardiovascular;  Laterality: N/A;  . RIGHT HEART CATH N/A 05/03/2019   Procedure: RIGHT HEART CATH;  Surgeon: [name], MD;  Location: [name];  Service: Cardiovascular;  Laterality: N/A;  . TYMPANIC MEMBRANE REPAIR  1/12   left  . VASECTOMY      SOCIAL HISTORY:   Social History   Tobacco Use  . Smoking status: Former Smoker    Types: Cigars    Quit date: 09/02/1994    Years since quitting: 24.8  . Smokeless tobacco: Never Used  Substance Use Topics  . Alcohol use: Yes    Alcohol/week: 0.0 - 1.0 standard drinks    Comment: occasional- 3 in the past year    FAMILY HISTORY:   Family History  Problem Relation Age of Onset  . Heart disease Father   .  Kidney failure Father   . Kidney disease Father   . Diabetes Maternal Grandmother   . Breast cancer Maternal Grandmother   . Valvular heart disease Mother   . Liver cancer Paternal Uncle   . Liver cancer Paternal Grandmother   . Prostate cancer Neg Hx     DRUG ALLERGIES:   Allergies  Allergen Reactions  . Morphine Itching  . Acetaminophen Other (See Comments)    BC of transplant  . Ibuprofen Other (See Comments)    Due to kidney transplant    REVIEW OF SYSTEMS:   ROS As per history of present illness. All pertinent systems were reviewed above. Constitutional,  HEENT, cardiovascular, respiratory, GI, GU, musculoskeletal, neuro, psychiatric,  endocrine,  integumentary and hematologic systems were reviewed and are otherwise  negative/unremarkable except for positive findings mentioned above in the HPI.   MEDICATIONS AT HOME:   Prior to Admission medications   Medication Sig Start Date End Date Taking? Authorizing Provider  amLODipine (NORVASC) 5 MG tablet Take 1 tablet (5 mg total) by mouth daily. 06/10/19   [name], MD  apixaban (ELIQUIS) 5 MG TABS tablet Take 1 tablet (5 mg total) by mouth 2 (two) times daily. 04/05/19   [name], MD  beclomethasone (QVAR) 80 MCG/ACT inhaler Inhale 2 puffs into the lungs 2 (two) times daily. 10/01/13   [provider]  benzonatate (TESSALON) 200 MG capsule Take 1 capsule (200 mg total) by mouth 3 (three) times daily as needed for cough. 07/08/19   [name], MD  buPROPion (WELLBUTRIN SR) 150 MG 12 hr tablet TAKE 1 TABLET BY MOUTH TWICE DAILY Patient taking differently: Take 150 mg by mouth 2 (two) times daily.  03/30/19   [name], MD  colchicine 0.6 MG tablet Take 1 tablet (0.6 mg total) by mouth daily. Patient taking differently: Take 0.6 mg by mouth daily as needed (gout flare).  05/07/19   Clegg, Amy D, NP  dapagliflozin propanediol (FARXIGA) 10 MG TABS tablet Take 10 mg by mouth daily before  breakfast. 05/07/19   Clegg, Amy D, NP  ferrous sulfate 325 (65 FE) MG EC tablet Take 650 mg by mouth 2 (two) times daily.     [provider]  fluticasone (FLONASE) 50 MCG/ACT nasal spray PLACE 2 SPRAYS INTO BOTH NOSTRILS DAILY. 05/25/19   [name], MD  gabapentin (NEURONTIN) 300 MG capsule Take 300 mg by mouth at bedtime.  12/01/18 12/01/19  [provider]  HUMALOG KWIKPEN 100 UNIT/ML KwikPen Inject 5-10 Units into the skin 3 (three) times daily. Sliding scale 09/01/18   [provider]  insulin glargine (LANTUS) 100 unit/mL SOPN Inject 0.1 mLs (10 Units total) into the skin at bedtime. Currently using 15 units Patient taking differently: Inject 28 Units into the skin at bedtime.  11/16/16   Fritzi Mandes, MD  lisinopril (ZESTRIL) 40 MG tablet Take 0.5 tablets (20 mg total) by mouth daily. 05/07/19   Clegg, Amy D, NP  metolazone (ZAROXOLYN) 2.5 MG tablet Take 2.5 mg by mouth daily as needed (fluid retention).     [provider]  montelukast (SINGULAIR) 10 MG tablet TAKE 1 TABLET BY MOUTH AT BEDTIME Patient taking differently: Take 10 mg by mouth at bedtime.  06/01/18   Laverle Hobby, MD  Multiple Vitamin (MULTIVITAMIN WITH MINERALS) TABS tablet Take 1 tablet by mouth daily.    [provider]  mycophenolate (MYFORTIC) 180 MG EC tablet Take 360 mg by mouth 2 (two) times daily.     [provider]  omeprazole (PRILOSEC) 20 MG capsule TAKE 1 CAPSULE BY MOUTH ONCE DAILY Patient taking differently: Take 20 mg by mouth daily.  02/21/16   [name], MD  potassium chloride (MICRO-K) 10 MEQ CR capsule Take 4 capsules (40 mEq total) by mouth every morning AND 2 capsules (20 mEq total) every evening. 04/26/19   Lyda Jester M, PA-C  rosuvastatin (CRESTOR) 10 MG tablet TAKE 1 TABLET BY MOUTH DAILY Patient taking differently: Take 10 mg by mouth daily.  06/09/18   [name], MD  spironolactone (ALDACTONE) 25 MG tablet Take 25 mg by  mouth daily. 11/16/16   [provider]  tacrolimus (PROGRAF)  1 MG capsule Take 1 mg by mouth 2 (two) times daily.    [provider]  tadalafil (CIALIS) 20 MG tablet Take 10-20 mg by mouth every other day as needed for erectile dysfunction. 01/20/19   [provider]  tamsulosin (FLOMAX) 0.4 MG CAPS capsule Take 0.4 mg by mouth daily.     [provider]  torsemide (DEMADEX) 20 MG tablet Take torsemide 80 mg in am and 40 mg in pm Patient taking differently: Take torsemide 80 mg in am and 40 mg in pm 05/07/19   Clegg, Amy D, NP  traMADol (ULTRAM) 50 MG tablet Take 1 tablet (50 mg total) by mouth 3 (three) times daily as needed. Patient taking differently: Take 50 mg by mouth 3 (three) times daily as needed for moderate pain.  01/25/19   [name], MD  vitamin C (ASCORBIC ACID) 250 MG tablet Take 1 tablet (250 mg total) by mouth daily. 07/04/19   Max Sane, MD  zinc sulfate 220 (50 Zn) MG capsule Take 1 capsule (220 mg total) by mouth 2 (two) times daily. 07/04/19   Max Sane, MD      VITAL SIGNS:  Blood pressure (!) 154/75, pulse 68, temperature 98.4 F (36.9 C), resp. rate 20, height 5' 10.5" (1.791 m), weight 110.7 kg, SpO2 98 %.  PHYSICAL EXAMINATION:  Physical Exam  GENERAL:  59 y.o.-year-old Hispanic male patient lying in the bed with no acute distress.  EYES: Pupils equal, round, reactive to light and accommodation. No scleral icterus. Extraocular muscles intact.  HEENT: Head atraumatic, normocephalic. Oropharynx and nasopharynx clear.  NECK:  Supple, no jugular venous distention. No thyroid enlargement, no tenderness.  LUNGS: Normal breath sounds bilaterally, no wheezing, rales,rhonchi or crepitation. No use of accessory muscles of respiration.  CARDIOVASCULAR: Regular rate and rhythm, S1, S2 normal. No murmurs, rubs, or gallops.  He has left antecubital AV fistula tenderness and firmness. ABDOMEN: Soft, nondistended, nontender. Bowel sounds  present. No organomegaly or mass.  EXTREMITIES: No pedal edema, cyanosis, or clubbing.  NEUROLOGIC: Cranial nerves II through XII are intact. Muscle strength 5/5 in all extremities. Sensation intact. Gait not checked.  PSYCHIATRIC: The patient is alert and oriented x 3.  Normal affect and good eye contact. SKIN: No obvious rash, lesion, or ulcer.   LABORATORY PANEL:   CBC Recent Labs  Lab 07/12/19 1230  WBC 8.5  HGB 11.5*  HCT 37.3*  PLT 148*   ------------------------------------------------------------------------------------------------------------------  Chemistries  Recent Labs  Lab 07/12/19 1230  NA 136  K 5.3*  CL 98  CO2 27  GLUCOSE 202*  BUN 23*  CREATININE 1.40*  CALCIUM 9.2   ------------------------------------------------------------------------------------------------------------------  Cardiac Enzymes No results for input(s): TROPONINI in the last 168 hours. ------------------------------------------------------------------------------------------------------------------  RADIOLOGY:  DG Chest 2 View  Result Date: 07/12/2019 CLINICAL DATA:  Chest pain. EXAM: CHEST - 2 VIEW COMPARISON:  June 30, 2019. FINDINGS: Stable cardiomediastinal silhouette. Status post coronary artery bypass graft. Stable large calcified right paratracheal granuloma or lymph node is noted. No pneumothorax or pleural effusion is noted. Central pulmonary vascular congestion is noted. No definite pulmonary edema or consolidative process is noted. Bony thorax is unremarkable. IMPRESSION: Central pulmonary vascular congestion is noted without definite pulmonary edema or consolidative process. Electronically Signed   By: Marijo Conception M.D.   On: 07/12/2019 13:43   US Venous Img Upper Uni Left  Result Date: 07/12/2019 CLINICAL DATA:  59 year old male with left arm pain. Evaluate for DVT. EXAM: Left  UPPER EXTREMITY VENOUS DOPPLER ULTRASOUND TECHNIQUE: Gray-scale sonography with graded  compression, as well as color Doppler and duplex ultrasound were performed to evaluate the upper extremity deep venous system from the level of the subclavian vein and including the jugular, axillary, basilic, radial, ulnar and upper cephalic vein. Spectral Doppler was utilized to evaluate flow at rest and with distal augmentation maneuvers. COMPARISON:  None. FINDINGS: Contralateral Subclavian Vein: Respiratory phasicity is normal and symmetric with the symptomatic side. No evidence of thrombus. Normal compressibility. Internal Jugular Vein: No evidence of thrombus. Normal compressibility, respiratory phasicity and response to augmentation. Subclavian Vein: No evidence of thrombus. Normal compressibility, respiratory phasicity and response to augmentation. Axillary Vein: No evidence of thrombus. Normal compressibility, respiratory phasicity and response to augmentation. Cephalic Vein: There is occlusive thrombus in the distal cephalic vein at the antecubital fossa at the site of fistula anastomosis. Basilic Vein: No evidence of thrombus. Normal compressibility, respiratory phasicity and response to augmentation. Brachial Veins: No evidence of thrombus. Normal compressibility, respiratory phasicity and response to augmentation. Radial Veins: No evidence of thrombus. Normal compressibility, respiratory phasicity and response to augmentation. Ulnar Veins: No evidence of thrombus. Normal compressibility, respiratory phasicity and response to augmentation. Venous Reflux:  None visualized. Other Findings:  None visualized. IMPRESSION: Occluded cephalic vein at the AV fistula outflow tract in the antecubital fossa. No other thrombus identified. These results were called by telephone at the time of interpretation on 07/12/2019 at 8:12 pm to provider Merlyn Lot , who verbally acknowledged these results. Electronically Signed   By: Anner Crete M.D.   On: 07/12/2019 20:30      IMPRESSION AND PLAN:   1.  Left  AV fistula occlusion with cephalic vein thrombosis on Eliquis with acute respiratory failure with hypoxia of unclear etiology, rule out acute pulmonary embolism. -The patient will be admitted to the cardiac progressive unit bed. -We will continue IV heparin drip to replace Eliquis for now. -Pain management will be provided. -Vascular surgery consultation will be obtained. -I notified Dr. Lucky Cowboy who is aware about the patient. -O2 protocol will be followed. -We will obtain VQ scan in a.m. to rule out PE. -We will continue p.o. diuretics given central vascular congestion on chest x-ray and give 1 dose of IV Lasix.  2.  Hypertension. -We will continue Norvasc.  3.  Gout. -We will continue allopurinol.  4.  Type 2 diabetes mellitus. -The patient will be placed on supplement coverage with NovoLog and will continue [place].  5.  Dyslipidemia. -We will continue statin therapy.  6.  BPH. -We will continue Flomax.  7.  DVT prophylaxis. -The patient will be on IV heparin as mentioned above.  All the records are reviewed and case discussed with ED provider. The plan of care was discussed in details with the patient (and family). I answered all questions. The patient agreed to proceed with the above mentioned plan. Further management will depend upon hospital course.   CODE STATUS: Full code  Status is: Inpatient  Remains inpatient appropriate because:Ongoing active pain requiring inpatient pain management, Ongoing diagnostic testing needed not appropriate for outpatient work up, IV treatments appropriate due to intensity of illness or inability to take PO and Inpatient level of care appropriate due to severity of illness   Dispo: The patient is from: Home              Anticipated d/c is to: Home              Anticipated d/c  date is: 2 days              Patient currently is not medically stable to d/c.    TOTAL TIME TAKING CARE OF THIS PATIENT: 55 minutes.    Christel Mormon M.D on  07/12/2019 at 9:01 PM  [facility]   From 7 PM-7 AM, contact night-coverage [email or web address]  CC: Primary care physician; [name], MD   Note: This dictation was prepared with Dragon dictation along with smaller phrase technology. Any transcriptional errors that result from this process are unintentional.

## 2019-07-12 NOTE — Telephone Encounter (Signed)
Left v/m for pt to cb [facility]. Pt has virtual appt with cardiology 07/12/19 at 9 AM.

## 2019-07-12 NOTE — Telephone Encounter (Signed)
Patient and his wife called stating that he did a virtual visit with the NP at his cardiologist office. Patient stated that the NP advised him to go to the ER . Patient stated that he was told to contact his PCP to see if he could see him today. Patient stated that he was told that he has pleurisy. Patient stated that he is having a lot of pain.  After reviewing the virtual visit notes it appeared that patient was going to ER. Patient and his wife stated that he would rather come to [facility] than go to the ER. After reviewing the chart the patient had already checked in the ED Department. Advised patient that he should stay at the ER for evaluation and pain control and he verbalized understanding. Marland Kitchen

## 2019-07-12 NOTE — Consult Note (Signed)
ANTICOAGULATION CONSULT NOTE - Initial Consult  Pharmacy Consult for heparin infusion  Indication: VTE Treatment   Allergies  Allergen Reactions  . Morphine Itching  . Acetaminophen Other (See Comments)    BC of transplant  . Ibuprofen Other (See Comments)    Due to kidney transplant    Patient Measurements: Height: 5' 10.5" (179.1 cm) Weight: 110.7 kg (244 lb) IBW/kg (Calculated) : 74.15 Heparin Dosing Weight: 98 kg  Vital Signs: Temp: 98.4 F (36.9 C) (04/19 1234) BP: 154/75 (04/19 1958) Pulse Rate: 68 (04/19 2000)  Labs: Recent Labs    07/12/19 1230 07/12/19 1524  HGB 11.5*  --   HCT 37.3*  --   PLT 148*  --   LABPROT 21.0*  --   INR 1.8*  --   CREATININE 1.40*  --   TROPONINIHS 20* 20*    Estimated Creatinine Clearance: 71.4 mL/min (A) (by C-G formula based on SCr of 1.4 mg/dL (H)).   Medical History: Past Medical History:  Diagnosis Date  . Allergy    seasonal  . Amnestic MCI (mild cognitive impairment with memory loss)   . Asthma   . Benign neoplasm of colon   . CAD (coronary artery disease) 2005   a.  Status post four-vessel CABG in 2005; b. 03/2018 MV: Small, fixed inferoapical and apical septal defect. No ischemia. EF 47% (60-65% by 05/2018 Echo).  . Chronic atrial fibrillation ([place])    a. CHADS2VASc 4 (CHF, HTN, DM, vascular disease)--> Eliquis; b. 09/2018 Zio: Chronic Afib.  . Chronic combined systolic and diastolic CHF (congestive heart failure) ([place])    a.  7/18 Echo: EF 45-50%; b. 05/2018 Echo: EF 60-65%, RVSP 74.8; c. 12/2018 Echo: EF 50-55%. Sev dil LA.  Marland Kitchen Chronic venous stasis dermatitis of both lower extremities   . Cytomegaloviral disease ([place]) 2017  . Depression   . Diabetes mellitus   . Diverticulosis   . Dyspnea   . Erectile dysfunction   . ESRD (end stage renal disease) ([place]) 2005   a. HD 2004-2012; b. S/p renal transplant in 2012  . GERD (gastroesophageal reflux disease)   . Gout   . Hyperlipidemia    low since renal failure  .  Hypertension   . Kidney transplant status, cadaveric 2012  . Obesity   . PAH (pulmonary artery hypertension) ([place])    a. 05/2018 Echo: RVSP 74.8mHg; b. 12/2018 RV not well visualized.  . Pneumonia   . [place]pura ([place]   . Sleep apnea    bipap with oxygen 2 l  . Trigger finger   . Ulcer 05/2016   Left shin  . Wears glasses     Assessment: Pharmacy consulted for heparin infusion dosing and monitoring for 59yo male admitted with Occluded cephalic vein.  Patient has PMH of A. Fib and was taking apixaban PTA. Last dose reported 0800 am 4/19.   Goal of Therapy:  Heparin level 0.3-0.7 units/ml aPTT 66-102 seconds Monitor platelets by anticoagulation protocol: Yes   Plan:  Baseline labs ordered Give 5500 units bolus x 1 Start heparin infusion at 1550 units/hr Will need to dose by aPTT levels until aptt and heparin level correlate  if initial heparin level is elevated. Check anti-Xa level/aptt in 6 hours and daily while on heparin Continue to monitor H&H and platelets  SPernell Dupre PharmD, BCPS Clinical Pharmacist 07/12/2019 9:21 PM

## 2019-07-12 NOTE — ED Notes (Signed)
Pt states the pain medication has helped and he feels much better now

## 2019-07-12 NOTE — ED Provider Notes (Signed)
[facility] Emergency Department Provider Note    None    (approximate)  I have reviewed the triage vital signs and the nursing notes.   HISTORY  Chief Complaint Weakness    HPI Keisuke Kalee Mcclenathan. is a 59 y.o. male presents the ER for worsening of left shoulder pain and chest discomfort that hurts worse with taking deep inspiration.  Patient was just recently admitted for sepsis secondary to pneumonia and tested Covid positive.  He is on anticoagulation for history of A. fib.  Feels like this is atypical for his anginal symptoms.  Was seen in cardiology clinic and directed to the ER for further evaluation.    Past Medical History:  Diagnosis Date  . Allergy    seasonal  . Amnestic MCI (mild cognitive impairment with memory loss)   . Asthma   . Benign neoplasm of colon   . CAD (coronary artery disease) 2005   a.  Status post four-vessel CABG in 2005; b. 03/2018 MV: Small, fixed inferoapical and apical septal defect. No ischemia. EF 47% (60-65% by 05/2018 Echo).  . Chronic atrial fibrillation ([place])    a. CHADS2VASc 4 (CHF, HTN, DM, vascular disease)--> Eliquis; b. 09/2018 Zio: Chronic Afib.  . Chronic combined systolic and diastolic CHF (congestive heart failure) ([place])    a.  7/18 Echo: EF 45-50%; b. 05/2018 Echo: EF 60-65%, RVSP 74.8; c. 12/2018 Echo: EF 50-55%. Sev dil LA.  Marland Kitchen Chronic venous stasis dermatitis of both lower extremities   . Cytomegaloviral disease ([place]) 2017  . Depression   . Diabetes mellitus   . Diverticulosis   . Dyspnea   . Erectile dysfunction   . ESRD (end stage renal disease) ([place]) 2005   a. HD 2004-2012; b. S/p renal transplant in 2012  . GERD (gastroesophageal reflux disease)   . Gout   . Hyperlipidemia    low since renal failure  . Hypertension   . Kidney transplant status, cadaveric 2012  . Obesity   . PAH (pulmonary artery hypertension) ([place])    a. 05/2018 Echo: RVSP 74.64mHg; b. 12/2018 RV not well visualized.  .  Pneumonia   . Purpura ([place]   . Sleep apnea    bipap with oxygen 2 l  . Trigger finger   . Ulcer 05/2016   Left shin  . Wears glasses    Family History  Problem Relation Age of Onset  . Heart disease Father   . Kidney failure Father   . Kidney disease Father   . Diabetes Maternal Grandmother   . Breast cancer Maternal Grandmother   . Valvular heart disease Mother   . Liver cancer Paternal Uncle   . Liver cancer Paternal Grandmother   . Prostate cancer Neg Hx    Past Surgical History:  Procedure Laterality Date  . BARIATRIC SURGERY    . CARDIAC CATHETERIZATION     [facility]  . CARDIAC CATHETERIZATION  05/03/2019  . CATARACT EXTRACTION W/PHACO Right 10/29/2017   Procedure: CATARACT EXTRACTION PHACO AND INTRAOCULAR LENS PLACEMENT ([place]  RIGHT DIABETIC;  Surgeon: BLeandrew Koyanagi MD;  [facility]  Service: Ophthalmology;  Laterality: Right;  Diabetic - insulin  . CATARACT EXTRACTION W/PHACO Left 11/18/2017   Procedure: CATARACT EXTRACTION PHACO AND INTRAOCULAR LENS PLACEMENT ([place] LEFT IVA/TOPICAL;  Surgeon: BLeandrew Koyanagi MD;  [place] [facility]  Service: Ophthalmology;  Laterality: Left;  DIABETES - insulin sleep apnea  . COLONOSCOPY WITH PROPOFOL N/A 04/22/2013   Procedure: COLONOSCOPY WITH PROPOFOL;  Surgeon: [name], MD;  Location: [facility];  Service: Endoscopy;  Laterality: N/A;  . CORONARY ARTERY BYPASS GRAFT  2005   X 4  . DG ANGIO AV SHUNT*L*     right and left upper arms  . FASCIOTOMY  03/03/2012   Procedure: FASCIOTOMY;  Surgeon: [name], MD;  Location: [place] [facility];  Service: Orthopedics;  Laterality: Right;  FASCIOTOMY RIGHT SMALL FINGER  . FASCIOTOMY Left 08/17/2013   Procedure: FASCIOTOMY LEFT RING;  Surgeon: [name], MD;  Location: [facility];  Service: Orthopedics;  Laterality: Left;  . INCISION AND DRAINAGE ABSCESS Left 10/15/2015   Procedure: INCISION AND DRAINAGE ABSCESS;   Surgeon: [name], MD;  Location: [facility];  Service: General;  Laterality: Left;  . KIDNEY TRANSPLANT  09/13/2010   cadaver--at [facility]  . RIGHT HEART CATH N/A 11/15/2016   Procedure: RIGHT HEART CATH;  Surgeon: [name], MD;  Location: [facility];  Service: Cardiovascular;  Laterality: N/A;  . RIGHT HEART CATH N/A 05/03/2019   Procedure: RIGHT HEART CATH;  Surgeon: [name], MD;  Location: [facility];  Service: Cardiovascular;  Laterality: N/A;  . TYMPANIC MEMBRANE REPAIR  1/12   left  . VASECTOMY     Patient Active Problem List   Diagnosis Date Noted  . AV fistula occlusion ([place]) 07/12/2019  . Sepsis with acute hypoxic respiratory failure without septic shock ([place])   . Permanent atrial fibrillation ([place])   . Pneumonia due to COVID-19 virus 07/01/2019  . Atypical pneumonia 07/01/2019  . COVID-19 07/01/2019  . Synovitis of finger 06/11/2019  . Pulmonary hypertension due to left heart disease ([place]) 01/20/2019  . Right knee pain 01/20/2019  . Chest pain with high risk of acute coronary syndrome 01/15/2019  . Trigger finger, unspecified little finger 12/23/2018  . Superficial thrombophlebitis 12/23/2018  . Fever 11/10/2018  . SOB (shortness of breath) 11/06/2018  . CHF (congestive heart failure) ([place]) 11/05/2018  . MDD (major depressive disorder), single episode, mild ([place]) 04/29/2018  . Acute gout 03/30/2018  . Cervical sprain, initial encounter 03/02/2018  . Moderate persistent asthma 05/21/2017  . Cough 02/10/2017  . Chronic diastolic heart failure ([place]) 11/19/2016  . Chronic venous insufficiency 06/05/2016  . Chronic ulcer of left leg ([place]) 10/26/2015  . Cytomegalovirus (CMV) viremia ([place]) 09/07/2015  . Proteinuria 06/23/2015  . Hypokalemia 10/13/2014  . Bradycardia 10/13/2014  . BPH with obstruction/lower urinary tract symptoms 09/25/2014  . Hypogonadism in male 09/25/2014  . Immunosuppressed status ([place]) 09/09/2014  . Type 2 diabetes  mellitus with hyperglycemia, with long-term current use of insulin ([place]) 09/09/2014  . Chronic atrial fibrillation ([place]) 07/18/2014  . DM eye manif type II 10/27/2013  . MDD (recurrent major depressive disorder) in remission ([place]) 10/04/2013  . Benign neoplasm of colon 04/22/2013  . Diverticulosis of colon (without mention of hemorrhage) 04/22/2013  . History of renal transplant 03/01/2013  . Asthma, persistent controlled 02/25/2013  . Erectile dysfunction 10/20/2012  . Obstructive sleep apnea 09/29/2012  . Obesity hypoventilation syndrome ([place]) 03/31/2012  . Routine general medical examination at a health care facility 11/12/2011  . Type 2 diabetes, controlled, with renal manifestation ([place]) 03/01/2011  . Essential hypertension 12/17/2010  . Coronary artery disease involving native coronary artery of native heart without angina pectoris 12/17/2010  . Hyperlipidemia   . ESRD (end stage renal disease) ([place])       Prior to Admission medications   Medication  Sig Start Date End Date Taking? Authorizing Provider  amLODipine (NORVASC) 5 MG tablet Take 1 tablet (5 mg total) by mouth daily. 06/10/19   [name], MD  apixaban (ELIQUIS) 5 MG TABS tablet Take 1 tablet (5 mg total) by mouth 2 (two) times daily. 04/05/19   [name], MD  beclomethasone (QVAR) 80 MCG/ACT inhaler Inhale 2 puffs into the lungs 2 (two) times daily. 10/01/13   [provider]  benzonatate (TESSALON) 200 MG capsule Take 1 capsule (200 mg total) by mouth 3 (three) times daily as needed for cough. 07/08/19   [name], MD  buPROPion (WELLBUTRIN SR) 150 MG 12 hr tablet TAKE 1 TABLET BY MOUTH TWICE DAILY Patient taking differently: Take 150 mg by mouth 2 (two) times daily.  03/30/19   [name], MD  colchicine 0.6 MG tablet Take 1 tablet (0.6 mg total) by mouth daily. Patient taking differently: Take 0.6 mg by mouth daily as needed (gout flare).  05/07/19   Clegg, Amy D, NP  dapagliflozin  propanediol (FARXIGA) 10 MG TABS tablet Take 10 mg by mouth daily before breakfast. 05/07/19   Clegg, Amy D, NP  ferrous sulfate 325 (65 FE) MG EC tablet Take 650 mg by mouth 2 (two) times daily.     [provider]  fluticasone (FLONASE) 50 MCG/ACT nasal spray PLACE 2 SPRAYS INTO BOTH NOSTRILS DAILY. 05/25/19   [name], MD  gabapentin (NEURONTIN) 300 MG capsule Take 300 mg by mouth at bedtime.  12/01/18 12/01/19  [provider]  HUMALOG KWIKPEN 100 UNIT/ML KwikPen Inject 5-10 Units into the skin 3 (three) times daily. Sliding scale 09/01/18   [provider]  insulin glargine (LANTUS) 100 unit/mL SOPN Inject 0.1 mLs (10 Units total) into the skin at bedtime. Currently using 15 units Patient taking differently: Inject 28 Units into the skin at bedtime.  11/16/16   Fritzi Mandes, MD  lisinopril (ZESTRIL) 40 MG tablet Take 0.5 tablets (20 mg total) by mouth daily. 05/07/19   Clegg, Amy D, NP  metolazone (ZAROXOLYN) 2.5 MG tablet Take 2.5 mg by mouth daily as needed (fluid retention).     [provider]  montelukast (SINGULAIR) 10 MG tablet TAKE 1 TABLET BY MOUTH AT BEDTIME Patient taking differently: Take 10 mg by mouth at bedtime.  06/01/18   Laverle Hobby, MD  Multiple Vitamin (MULTIVITAMIN WITH MINERALS) TABS tablet Take 1 tablet by mouth daily.    [provider]  mycophenolate (MYFORTIC) 180 MG EC tablet Take 360 mg by mouth 2 (two) times daily.     [provider]  omeprazole (PRILOSEC) 20 MG capsule TAKE 1 CAPSULE BY MOUTH ONCE DAILY Patient taking differently: Take 20 mg by mouth daily.  02/21/16   [name], MD  potassium chloride (MICRO-K) 10 MEQ CR capsule Take 4 capsules (40 mEq total) by mouth every morning AND 2 capsules (20 mEq total) every evening. 04/26/19   Lyda Jester M, PA-C  rosuvastatin (CRESTOR) 10 MG tablet TAKE 1 TABLET BY MOUTH DAILY Patient taking differently: Take 10 mg by mouth daily.  06/09/18   [name], MD  spironolactone (ALDACTONE) 25 MG tablet Take 25 mg by mouth daily. 11/16/16   [provider]  tacrolimus (PROGRAF) 1 MG capsule Take 1 mg by mouth 2 (two) times daily.    [provider]  tadalafil (CIALIS) 20 MG tablet Take 10-20 mg by mouth every other day as needed for erectile  dysfunction. 01/20/19   [provider]  tamsulosin (FLOMAX) 0.4 MG CAPS capsule Take 0.4 mg by mouth daily.     [provider]  torsemide (DEMADEX) 20 MG tablet Take torsemide 80 mg in am and 40 mg in pm Patient taking differently: Take torsemide 80 mg in am and 40 mg in pm 05/07/19   Clegg, Amy D, NP  traMADol (ULTRAM) 50 MG tablet Take 1 tablet (50 mg total) by mouth 3 (three) times daily as needed. Patient taking differently: Take 50 mg by mouth 3 (three) times daily as needed for moderate pain.  01/25/19   [name], MD  vitamin C (ASCORBIC ACID) 250 MG tablet Take 1 tablet (250 mg total) by mouth daily. 07/04/19   Max Sane, MD  zinc sulfate 220 (50 Zn) MG capsule Take 1 capsule (220 mg total) by mouth 2 (two) times daily. 07/04/19   Max Sane, MD    Allergies Morphine, Acetaminophen, and Ibuprofen    Social History Social History   Tobacco Use  . Smoking status: Former Smoker    Types: Cigars    Quit date: 09/02/1994    Years since quitting: 24.8  . Smokeless tobacco: Never Used  Substance Use Topics  . Alcohol use: Yes    Alcohol/week: 0.0 - 1.0 standard drinks    Comment: occasional- 3 in the past year  . Drug use: No    Review of Systems Patient denies headaches, rhinorrhea, blurry vision, numbness, shortness of breath, chest pain, edema, cough, abdominal pain, nausea, vomiting, diarrhea, dysuria, fevers, rashes or hallucinations unless otherwise stated above in HPI. ____________________________________________   PHYSICAL EXAM:  VITAL SIGNS: Vitals:   07/12/19 2000 07/12/19 2001  BP:    Pulse: 68   Resp: 15 20  Temp:      SpO2: 98%     Constitutional: Alert and oriented. Appears uncomfortable Eyes: Conjunctivae are normal.  Head: Atraumatic. Nose: No congestion/rhinnorhea. Mouth/Throat: Mucous membranes are moist.   Neck: No stridor. Painless ROM.  Cardiovascular: Normal rate, regular rhythm. Grossly normal heart sounds.  Good peripheral circulation.  Left AV fistula with palpable thrill Respiratory: Normal respiratory effort.  No retractions. Lungs CTAB. Gastrointestinal: Soft and nontender. No distention. No abdominal bruits. No CVA tenderness. Genitourinary: deferred Musculoskeletal: pain with palpation of left anterior chest and shoulder. No appreciable effusion, no overlying erythema.No lower extremity tenderness nor edema.  No joint effusions. Neurologic:  Normal speech and language. No gross focal neurologic deficits are appreciated. No facial droop Skin:  Skin is warm, dry and intact. No rash noted. Psychiatric: Mood and affect are normal. Speech and behavior are normal.  ____________________________________________   LABS (all labs ordered are listed, but only abnormal results are displayed)  Results for orders placed or performed during the hospital encounter of 07/12/19 (from the past 24 hour(s))  Basic metabolic panel     Status: Abnormal   Collection Time: 07/12/19 12:30 PM  Result Value Ref Range   Sodium 136 135 - 145 mmol/L   Potassium 5.3 (H) 3.5 - 5.1 mmol/L   Chloride 98 98 - 111 mmol/L   CO2 27 22 - 32 mmol/L   Glucose, Bld 202 (H) 70 - 99 mg/dL   BUN 23 (H) 6 - 20 mg/dL   Creatinine, Ser 1.40 (H) 0.61 - 1.24 mg/dL   Calcium 9.2 8.9 - 10.3 mg/dL   GFR calc non Af Amer 55 (L) >60 mL/min   GFR calc Af Amer >60 >60 mL/min  Anion gap 11 5 - 15  CBC     Status: Abnormal   Collection Time: 07/12/19 12:30 PM  Result Value Ref Range   WBC 8.5 4.0 - 10.5 K/uL   RBC 4.62 4.22 - 5.81 MIL/uL   Hemoglobin 11.5 (L) 13.0 - 17.0 g/dL   HCT 37.3 (L) 39.0 - 52.0 %   MCV 80.7 80.0 -  100.0 fL   MCH 24.9 (L) 26.0 - 34.0 pg   MCHC 30.8 30.0 - 36.0 g/dL   RDW 20.5 (H) 11.5 - 15.5 %   Platelets 148 (L) 150 - 400 K/uL   nRBC 0.0 0.0 - 0.2 %  Troponin I (High Sensitivity)     Status: Abnormal   Collection Time: 07/12/19 12:30 PM  Result Value Ref Range   Troponin I (High Sensitivity) 20 (H) <18 ng/L  Protime-INR (order if Patient is taking Coumadin / Warfarin)     Status: Abnormal   Collection Time: 07/12/19 12:30 PM  Result Value Ref Range   Prothrombin Time 21.0 (H) 11.4 - 15.2 seconds   INR 1.8 (H) 0.8 - 1.2  Troponin I (High Sensitivity)     Status: Abnormal   Collection Time: 07/12/19  3:24 PM  Result Value Ref Range   Troponin I (High Sensitivity) 20 (H) <18 ng/L   ____________________________________________  EKG My review and personal interpretation at Time: 12:36    Indication: arm pain  Rate: 70  Rhythm: sinus Axis: left Other: poor r wave progression ____________________________________________  RADIOLOGY  I personally reviewed all radiographic images ordered to evaluate for the above acute complaints and reviewed radiology reports and findings.  These findings were personally discussed with the patient.  Please see medical record for radiology report.  ____________________________________________   PROCEDURES  Procedure(s) performed:  Procedures    Critical Care performed: no ____________________________________________   INITIAL IMPRESSION / ASSESSMENT AND PLAN / ED COURSE  Pertinent labs & imaging results that were available during my care of the patient were reviewed by me and considered in my medical decision making (see chart for details).   DDX: Thrombosis, stenosis, occluded graft, cellulitis, graft infection, pneumonia, PE, dissection  Tushar Shandon [place]. is a 59 y.o. who presents to the ED with symptoms as described above.  Patient very uncomfortable appearing it seems primarily that his pain is in the left shoulder though he  does not have any effusions noted.  Does have a palpable thrill to the AV fistula.  No pneumothorax or infiltrate or consolidation on chest x-ray. The patient will be placed on continuous pulse oximetry and telemetry for monitoring.  Laboratory evaluation will be sent to evaluate for the above complaints.     Clinical Course as of Jul 11 2101  Mon Jul 12, 2019  [id number] On reassessment patient seems quite exquisitely tender overlying the graft area.  May be some referred pain but it seems to be primarily related to aneurysmal veins.  Discussed case with nephrology and as he is transplant patient will try to avoid contrast I have a lower suspicion for dissection he does have intact pulses distally.  Will order ultrasound.  Will discuss case in consultation with vascular.   [PR]    Clinical Course User Index [PR] Merlyn Lot, MD   Ultrasound shows evidence of occluded cephalic vein.  This is more distal to where he is actually having pain by the patient also developed hypoxia during evaluation he satting well on 2 L right now.  His pain has  improved with pain medication.  With this occlusion as the patient is already on anticoagulation post Covid state will place on heparin.  Will discuss with hospitalist for admission.  The patient was evaluated in Emergency Department today for the symptoms described in the history of present illness. He/she was evaluated in the context of the global COVID-19 pandemic, which necessitated consideration that the patient might be at risk for infection with the SARS-CoV-2 virus that causes COVID-19. Institutional protocols and algorithms that pertain to the evaluation of patients at risk for COVID-19 are in a state of rapid change based on information released by regulatory bodies including the CDC and federal and state organizations. These policies and algorithms were followed during the patient's care in the ED.  As part of my medical decision making, I reviewed the  following data within the [facility] notes reviewed and incorporated, Labs reviewed, notes from prior ED visits and [place] Controlled Substance Database   ____________________________________________   FINAL CLINICAL IMPRESSION(S) / ED DIAGNOSES  Final diagnoses:  Arm pain, anterior, left  Cephalic vein thrombosis, left  Acute respiratory failure with hypoxia ([place])      NEW MEDICATIONS STARTED DURING THIS VISIT:  New Prescriptions   No medications on file     Note:  This document was prepared using Dragon voice recognition software and may include unintentional dictation errors.    Merlyn Lot, MD 07/12/19 2103

## 2019-07-12 NOTE — ED Triage Notes (Addendum)
Pt comes via [facility] with c/o fluid on lungs and left shoulder pain. Pt states pain in both shoulder. Pt states some weakness.  Pt states this all started Friday. Pt states SOB. Pt states cough that is nonproductive.  Pt appears in pain. Pt unable to sit still.

## 2019-07-12 NOTE — ED Notes (Signed)
This RN messaged pharmacy to verify all medications.

## 2019-07-12 NOTE — ED Notes (Signed)
Upon entering room, pt is resting with eyes closed. This RN asked pt if pain subsided and pt states that it has decreased a lot and he feels much better. Pt denies any further needs at this time.

## 2019-07-12 NOTE — Telephone Encounter (Signed)
He will at least need evaluation and some imaging Could still be related to the recent COVID infection Will await ER disposition to decide needed follow up

## 2019-07-12 NOTE — ED Notes (Signed)
Pt states morphine makes his itch. Informed pt that I could request a different pain medication from MD. Pt states it is ok he will deal with itching verses the intense pain.

## 2019-07-12 NOTE — Progress Notes (Signed)
Office Visit    Patient Name: [name]. Date of Encounter: 07/12/2019  Primary Care Provider:  [name], MD Primary Cardiologist:  [name], MD Electrophysiologist:  None   Chief Complaint    Carl [name] Viscomi. is a 59 y.o. male with a hx of ESRD, CAD s/p CABG 2005, chronic combined systolic and diastolic heart failure, chronic atrial fibrillation on anticoagulation COVID-19 pneumonia presents today for chest and shoulder pain.   Past Medical History    Past Medical History:  Diagnosis Date  . Allergy    seasonal  . Amnestic MCI (mild cognitive impairment with memory loss)   . Asthma   . Benign neoplasm of colon   . CAD (coronary artery disease) 2005   a.  Status post four-vessel CABG in 2005; b. 03/2018 MV: Small, fixed inferoapical and apical septal defect. No ischemia. EF 47% (60-65% by 05/2018 Echo).  . Chronic atrial fibrillation ([place])    a. CHADS2VASc 4 (CHF, HTN, DM, vascular disease)--> Eliquis; b. 09/2018 Zio: Chronic Afib.  . Chronic combined systolic and diastolic CHF (congestive heart failure) ([place])    a.  7/18 Echo: EF 45-50%; b. 05/2018 Echo: EF 60-65%, RVSP 74.8; c. 12/2018 Echo: EF 50-55%. Sev dil LA.  Marland Kitchen Chronic venous stasis dermatitis of both lower extremities   . Cytomegaloviral disease ([place]) 2017  . Depression   . Diabetes mellitus   . Diverticulosis   . Dyspnea   . Erectile dysfunction   . ESRD (end stage renal disease) ([place]) 2005   a. HD 2004-2012; b. S/p renal transplant in 2012  . GERD (gastroesophageal reflux disease)   . Gout   . Hyperlipidemia    low since renal failure  . Hypertension   . Kidney transplant status, cadaveric 2012  . Obesity   . PAH (pulmonary artery hypertension) ([place])    a. 05/2018 Echo: RVSP 74.30mHg; b. 12/2018 RV not well visualized.  . Pneumonia   . [place]pura [place]   . Sleep apnea    bipap with oxygen 2 l  . Trigger finger   . Ulcer 05/2016   Left shin  . Wears glasses    Past  Surgical History:  Procedure Laterality Date  . BARIATRIC SURGERY    . CARDIAC CATHETERIZATION     [facility]  . CARDIAC CATHETERIZATION  05/03/2019  . CATARACT EXTRACTION W/PHACO Right 10/29/2017   Procedure: CATARACT EXTRACTION PHACO AND INTRAOCULAR LENS PLACEMENT ([place]  RIGHT DIABETIC;  Surgeon: BLeandrew Koyanagi MD;  [place]  Service: Ophthalmology;  Laterality: Right;  Diabetic - insulin  . CATARACT EXTRACTION W/PHACO Left 11/18/2017   Procedure: CATARACT EXTRACTION PHACO AND INTRAOCULAR LENS PLACEMENT ([place] IVA/TOPICAL;  Surgeon: BLeandrew Koyanagi MD;  [place]  Service: Ophthalmology;  Laterality: Left;  DIABETES - insulin sleep apnea  . COLONOSCOPY WITH PROPOFOL N/A 04/22/2013   Procedure: COLONOSCOPY WITH PROPOFOL;  Surgeon: DMilus Banister MD;  [facility];  Service: Endoscopy;  Laterality: N/A;  . CORONARY ARTERY BYPASS GRAFT  2005   X 4  . DG ANGIO AV SHUNT*L*     right and left upper arms  . FASCIOTOMY  03/03/2012   Procedure: FASCIOTOMY;  Surgeon: GWynonia Sours MD;  [place]  Service: Orthopedics;  Laterality: Right;  FASCIOTOMY RIGHT SMALL FINGER  . FASCIOTOMY Left 08/17/2013   Procedure: FASCIOTOMY LEFT RING;  Surgeon: GWynonia Sours MD;  [place]  Service:  Orthopedics;  Laterality: Left;  . INCISION AND DRAINAGE ABSCESS Left 10/15/2015   Procedure: INCISION AND DRAINAGE ABSCESS;  Surgeon: [name], MD;  Location: [facility];  Service: General;  Laterality: Left;  . KIDNEY TRANSPLANT  09/13/2010   cadaver--at [facility]  . RIGHT HEART CATH N/A 11/15/2016   Procedure: RIGHT HEART CATH;  Surgeon: [name], MD;  Location: [place] [facility];  Service: Cardiovascular;  Laterality: N/A;  . RIGHT HEART CATH N/A 05/03/2019   Procedure: RIGHT HEART CATH;  Surgeon: [name], MD;  Location: [place] [facility];  Service: Cardiovascular;  Laterality: N/A;  .  TYMPANIC MEMBRANE REPAIR  1/12   left  . VASECTOMY      Allergies  Allergies  Allergen Reactions  . Morphine Itching  . Acetaminophen Other (See Comments)    BC of transplant  . Ibuprofen Other (See Comments)    Due to kidney transplant    History of Present Illness    Carl Christain Niznik. is a 59 y.o. male with a hx of  ESRD, CAD s/p CABG 2005, chronic combined systolic and diastolic heart failure, chronic atrial fibrillation on anticoagulation, COVID-19 pneumonia.  Seen this morning by Elenor Quinones, PA for chest pain via virtual visit and was recommended to come into the office as he had chest pain symptoms atypical for ischemia.  He has a history of ESRD s/p DDRT 08/2010. He underwent biopsy 05/2015 for increased proteinuria which revealed FSGS and diabetic glomerulopathy. He follows with [facility]rology [facility]. He has a history of CABG x4 in 2005 with a nonischemic stress testing in January 2020.  EF previously low at 35 to 40%; however, in July 2018, EF was found to be 45 to 50% with normal RV systolic function and normal PA pressures.  In August 2018, right heart catheterization was performed in the setting of ongoing volume overload requiring admission, showing PA pressure of 94/38 (59), and a wedge pressure of 31 despite several days of diuresis prior to catheterization.  Eventually, patient did improve on IV diuresis.  Follow-up echo 05/2018 showed normal LV function with severe pulmonary hypertension and RVSP of 74.8 mmHg.  In May 2020, he underwent event monitoring in the set of intermittent dizziness.  This showed persistent atrial fibrillation without significant bradycardia with the exception of rates down in the 30s and 40s during periods of sleep.  No indication for pacing.  He was admitted October 23 for chest pain with trivial high-sensitivity troponin elevation.  He was placed on Lasix 40 mg twice daily.  Echo during admission showed EF 50 to 55% with severe biatrial  enlargement  The RV was not well seen and degree of pulmonary hypertension was unable to be calculated.    At follow-up on October 27, weight was up 10 to 15 pounds and he noted mild bilateral lower extremity edema with significant abdominal distention.  He was given Lasix 40 mg IV x1. Taldalafil 40 mg daily was discussed in the setting of pulmonary hypertension, and with patient expressing interest, this was added to his medications.   On 03/24/2019, the patient was admitted for acute exacerbation of chronic combined left and right heart failure.   At admission, his weight was 279 pounds. 03/25/2019 echo was performed and showed moderate LVE, mild LVH, low normal LV SF with EF 50 to 55%, abnormal septal wall motion, moderate LAE, mild RAE, mild aortic stenosis, and estimated RA pressure 15 mmHg.  EKG showed Afib, RBBB  with QRS 178m. He was IV diuresed with IV Lasix 120 mg twice daily for 2 days then transition to 80 mg p.o. Lasix the day prior to discharge.  Discharge weight was 273 pounds.  Given increasing blood pressure, his home dose of amlodipine was increased from 5 mg to 10 mg.    He was seen by nephrology in early March 30, 2019 with recommendation to start iron supplementation. He was seen by endocrinology 04/06/19 with notes indicating that his diabetes remained uncontrolled and complicated by diabetic retinopathy. It was also noted his weight was up 14lbs from his previous visit. His BP was noted to be controlled though no documented pressures on review of vitals.   Admitted 06/30/2019 to 07/04/2019 with Covid pneumonia.  Treated with steroids and 5 doses of remdesivir.  Discharged with Tessalon for cough.  Tells me his cough resolved Saturday.  During virtual visit this morning he noted chest pain.  Encouraged to come in for office visit.  When evaluated by me reports chest pain starting Friday that was gradually worsening.  Chest pain is in her his chest and radiates to his bilateral  shoulders and back.  It is not relieved by Tylenol nor by rest.  He is unable to take NSAIDs secondary to his kidney history and chronic anticoagulation.  He reports it hurts worse to take a deep breath.  Due to his extreme discomfort in clinic he was recommended to proceed to the emergency department for further evaluation.    We did discuss briefly working of outpatient with stat chest x-ray and labs (CMP, CRP, sed rate) however due to need for pain control recommended for ED instead and he was agreeable.  EKGs/Labs/Other Studies Reviewed:   The following studies were reviewed today:  EKG:  EKG is ordered today.  The ekg ordered today demonstrates rate controlled atrial fibrillation 72 bpm with no acute ST/T wave changes.  Recent Labs: 05/03/2019: TSH 1.615 06/01/2019: B Natriuretic Peptide 246.0 07/04/2019: ALT 10; BUN 86; Creatinine, Ser 1.87; Hemoglobin 10.5; Magnesium 2.4; Platelets 154; Potassium 4.5; Sodium 135  Recent Lipid Panel    Component Value Date/Time   CHOL 89 10/09/2017 0000   CHOL 161 03/07/2015 0823   TRIG 73 03/06/2016 0736   HDL 34 (L) 03/06/2016 0736   HDL 38 (L) 03/07/2015 0823   CHOLHDL 2.7 03/06/2016 0736   VLDL 15 03/06/2016 0736   LDLCALC 49 10/09/2017 0000   LDLCALC 105 (H) 03/07/2015 0823   LDLDIRECT 41.9 04/20/2012 1618    Home Medications   Current Meds  Medication Sig  . amLODipine (NORVASC) 5 MG tablet Take 1 tablet (5 mg total) by mouth daily.  .Marland Kitchenapixaban (ELIQUIS) 5 MG TABS tablet Take 1 tablet (5 mg total) by mouth 2 (two) times daily.  . beclomethasone (QVAR) 80 MCG/ACT inhaler Inhale 2 puffs into the lungs 2 (two) times daily.  . benzonatate (TESSALON) 200 MG capsule Take 1 capsule (200 mg total) by mouth 3 (three) times daily as needed for cough.  .Marland KitchenbuPROPion (WELLBUTRIN SR) 150 MG 12 hr tablet TAKE 1 TABLET BY MOUTH TWICE DAILY (Patient taking differently: Take 150 mg by mouth 2 (two) times daily. )  . colchicine 0.6 MG tablet Take 1 tablet  (0.6 mg total) by mouth daily. (Patient taking differently: Take 0.6 mg by mouth daily as needed (gout flare). )  . dapagliflozin propanediol (FARXIGA) 10 MG TABS tablet Take 10 mg by mouth daily before breakfast.  . ferrous sulfate 325 (65  FE) MG EC tablet Take 650 mg by mouth 2 (two) times daily.   . fluticasone (FLONASE) 50 MCG/ACT nasal spray PLACE 2 SPRAYS INTO BOTH NOSTRILS DAILY.  Marland Kitchen gabapentin (NEURONTIN) 300 MG capsule Take 300 mg by mouth at bedtime.   Marland Kitchen HUMALOG KWIKPEN 100 UNIT/ML KwikPen Inject 5-10 Units into the skin 3 (three) times daily. Sliding scale  . insulin glargine (LANTUS) 100 unit/mL SOPN Inject 0.1 mLs (10 Units total) into the skin at bedtime. Currently using 15 units (Patient taking differently: Inject 28 Units into the skin at bedtime. )  . lisinopril (ZESTRIL) 40 MG tablet Take 0.5 tablets (20 mg total) by mouth daily.  . metolazone (ZAROXOLYN) 2.5 MG tablet Take 2.5 mg by mouth daily as needed (fluid retention).   . montelukast (SINGULAIR) 10 MG tablet TAKE 1 TABLET BY MOUTH AT BEDTIME (Patient taking differently: Take 10 mg by mouth at bedtime. )  . Multiple Vitamin (MULTIVITAMIN WITH MINERALS) TABS tablet Take 1 tablet by mouth daily.  . mycophenolate (MYFORTIC) 180 MG EC tablet Take 360 mg by mouth 2 (two) times daily.   Marland Kitchen omeprazole (PRILOSEC) 20 MG capsule TAKE 1 CAPSULE BY MOUTH ONCE DAILY (Patient taking differently: Take 20 mg by mouth daily. )  . potassium chloride (MICRO-K) 10 MEQ CR capsule Take 4 capsules (40 mEq total) by mouth every morning AND 2 capsules (20 mEq total) every evening.  . rosuvastatin (CRESTOR) 10 MG tablet TAKE 1 TABLET BY MOUTH DAILY (Patient taking differently: Take 10 mg by mouth daily. )  . spironolactone (ALDACTONE) 25 MG tablet Take 25 mg by mouth daily.  . tacrolimus (PROGRAF) 1 MG capsule Take 1 mg by mouth 2 (two) times daily.  . tadalafil (CIALIS) 20 MG tablet Take 10-20 mg by mouth every other day as needed for erectile  dysfunction.  . tamsulosin (FLOMAX) 0.4 MG CAPS capsule Take 0.4 mg by mouth daily.   Marland Kitchen torsemide (DEMADEX) 20 MG tablet Take torsemide 80 mg in am and 40 mg in pm (Patient taking differently: Take torsemide 80 mg in am and 40 mg in pm)  . traMADol (ULTRAM) 50 MG tablet Take 1 tablet (50 mg total) by mouth 3 (three) times daily as needed. (Patient taking differently: Take 50 mg by mouth 3 (three) times daily as needed for moderate pain. )  . vitamin C (ASCORBIC ACID) 250 MG tablet Take 1 tablet (250 mg total) by mouth daily.  Marland Kitchen zinc sulfate 220 (50 Zn) MG capsule Take 1 capsule (220 mg total) by mouth 2 (two) times daily.      Review of Systems       Review of Systems  Constitution: Positive for malaise/fatigue. Negative for chills and fever.  Cardiovascular: Positive for chest pain. Negative for dyspnea on exertion, irregular heartbeat, leg swelling, near-syncope, orthopnea, palpitations and syncope.  Respiratory: Positive for shortness of breath. Negative for cough (resolved Saturday) and wheezing.   Musculoskeletal:       (+) bilateral shoulder pain  Gastrointestinal: Negative for melena, nausea and vomiting.  Genitourinary: Negative for hematuria.  Neurological: Negative for dizziness, light-headedness and weakness.   All other systems reviewed and are otherwise negative except as noted above.  Physical Exam    VS:  BP 130/60 (BP Location: Right Arm, Patient Position: Sitting, Cuff Size: Normal)   Pulse 72   Ht 5' 10.5" (1.791 m)   Wt 244 lb (110.7 kg)   SpO2 95%   BMI 34.52 kg/m  , BMI Body mass  index is 34.52 kg/m. GEN: Well nourished, overweight, well developed, in no acute distress. HEENT: normal. Neck: Supple, no JVD, carotid bruits, or masses. Cardiac: irregularly irregular,  no murmurs, rubs, or gallops. No clubbing, cyanosis, edema.  Radials2+ and equal bilaterally.  Respiratory: Lung sounds diminished bilaterally.  Patient unable to take deep breath due to  pain. GI: Soft, nontender, nondistended, BS + x 4. MS: No deformity or atrophy.  Chest wall as well as upper back very tender to palpation.  Skin: Warm and dry, no rash.  Neuro:  Strength and sensation are intact. Psych: Normal affect.  Accessory Clinical Findings    ECG personally reviewed by me today -rate controlled atrial fibrillation 72 bpm with no acute ST/T wave changes- no acute changes.  Assessment & Plan    1. Precordial chest pain and upper back pain/Pleurisy - Sudden onset Friday.  Worsening over the weekend.  Unrelieved by heat, Tylenol.  Worse with deep breathing.  No noted relieving factors.  Likely etiology pleurisy.  Low suspicion angina as EKG without acute changes, does not occur with activity, unrelieved by rest.  Low suspicion PE as he has not missed any doses of his anticoagulation.  We will proceed to ED for pain control.  May benefit from course of colchicine.  2. Atrial fibrillation with chronic anticoagulation -EKG today shows rate controlled atrial fibrillation 72 ppm.  Well-controlled on no rate limiting agents.  Reports no missed doses of his anticoagulation.  Denies bleeding complications.  Anticoagulated secondary CHA2DS2-VASc or of at least 3.  Low suspicion of blood clot or PE is contributing to his symptoms as he reports he has not missed doses of anticoagulation.  However, cannot be completely excluded in the setting of recent COVID-19 infection.  3. COVID PNA -recent admit for Covid pneumonia treated with steroids and remdesivir.  Completed full course of steroids and rhythm severe.  Anticipate his symptoms as above are likely pleurisy secondary to Covid pneumonia.  4. CAD s/p CABG -chest pain very atypical for CAD, as described above.  Reports prior to his CABG in 2005 he had no anginal symptoms.  EKG today without acute ST/T wave changes.  Continue GDMT including statin.  No aspirin secondary to chronic anticoagulation.  5. CKD -follows with urology.   Careful titration of antihypertensives and diuretics.  Would avoid NSAIDs in the setting of current pain issue.  6. HFrEF -Echo 05/03/2019 LVEF 45-50%.  Euvolemic on exam.  Continue present GDMT including dapagliflozin, ACE, MRA, loop diuretic.   Disposition: Given options to present to ED for evaluation versus stat x-ray and labs.  Given need for pain control, proceeded to ED.  Follow up in September as previously scheduled or as needed with [name].    Loel Dubonnet, NP 07/12/2019, 11:46 AM

## 2019-07-12 NOTE — Telephone Encounter (Signed)
We may need to set up some type of in person visit if this continues He had felt well---so he may have overdone it once he came home (he was heading out fishing the day I did my virtual visit)

## 2019-07-12 NOTE — ED Notes (Signed)
This RN attempted to get heparin out of pyxis and it was out, pharmacy made aware.

## 2019-07-12 NOTE — Telephone Encounter (Signed)
[facility] Night - Client Nonclinical Telephone Record AccessNurse Client [facility] Night - Client Client Site [place] Physician [name] - MD Contact Type Call Who Is Calling Patient / Member / Family / Caregiver Caller Name Isreal Zarion Oliff Phone Number [phone] Patient Name Emric Kowalewski Patient DOB Aug 18, 1960 Call Type Message Only Information Provided Reason for Call Request for General Office Information Initial Comment Caller states he is in severe pain in his shoulders. He is recovering from [place] and is unsure if that is what is causing the pain. He is wanting a return calling Additional Comment Caller declined triage. Provided office hours Disp. Time Disposition Final User 07/12/2019 7:54:16 AM General Information Provided Yes Rulon Eisenmenger Call Closed By: Rulon Eisenmenger Transaction Date/Time: 07/12/2019 7:51:37 AM (ET)

## 2019-07-12 NOTE — Telephone Encounter (Signed)
I spoke with pt; pt is presently at cardiology and pt is not sure what they are going to do yet. Pt will cb for in office appt with Dr Silvio Pate if needed after seeing cardiology. Pt said tested positive for covid 14 days ago today. FYI to Dr Silvio Pate.

## 2019-07-12 NOTE — Telephone Encounter (Signed)
   This documentation is to reflect that virtual visit cancelled today due to need for in person evaluation. At the start of today's call, patient noted severe left upper extremity pain, warmth, and erythema with his wife noting a palpable thrill when she placed her hand over his L armpit. Also reported was pain between the shoulder blades and associated SOB at rest and chest discomfort. This pain was made worse with deeper breathing. The patient was unable to speak 2/2 pain during the initial call with recommendation to instead present to clinic for EKG and to determine need for further imaging and workup if needed. This was explained to patient and wife with their understanding and plan to present to clinic for a visit later today or at the earliest availability.

## 2019-07-12 NOTE — ED Notes (Signed)
Pt SpO2 continues to drop to 80% and then increase back to 90% with good wave form. This RN notified Dr. Quentin Cornwall and placed pt on 2L of oxygen.

## 2019-07-12 NOTE — Telephone Encounter (Signed)
He is currently in the ER getting checked

## 2019-07-13 ENCOUNTER — Inpatient Hospital Stay: Payer: 59

## 2019-07-13 DIAGNOSIS — R5081 Fever presenting with conditions classified elsewhere: Secondary | ICD-10-CM

## 2019-07-13 DIAGNOSIS — M069 Rheumatoid arthritis, unspecified: Secondary | ICD-10-CM

## 2019-07-13 DIAGNOSIS — Z8701 Personal history of pneumonia (recurrent): Secondary | ICD-10-CM

## 2019-07-13 DIAGNOSIS — I82612 Acute embolism and thrombosis of superficial veins of left upper extremity: Secondary | ICD-10-CM

## 2019-07-13 DIAGNOSIS — Z951 Presence of aortocoronary bypass graft: Secondary | ICD-10-CM

## 2019-07-13 DIAGNOSIS — Z91041 Radiographic dye allergy status: Secondary | ICD-10-CM

## 2019-07-13 DIAGNOSIS — R509 Fever, unspecified: Secondary | ICD-10-CM

## 2019-07-13 DIAGNOSIS — I2510 Atherosclerotic heart disease of native coronary artery without angina pectoris: Secondary | ICD-10-CM

## 2019-07-13 DIAGNOSIS — M79602 Pain in left arm: Secondary | ICD-10-CM

## 2019-07-13 DIAGNOSIS — Z888 Allergy status to other drugs, medicaments and biological substances status: Secondary | ICD-10-CM

## 2019-07-13 DIAGNOSIS — Z940 Kidney transplant status: Secondary | ICD-10-CM

## 2019-07-13 DIAGNOSIS — M109 Gout, unspecified: Secondary | ICD-10-CM

## 2019-07-13 DIAGNOSIS — J9601 Acute respiratory failure with hypoxia: Secondary | ICD-10-CM

## 2019-07-13 DIAGNOSIS — Z87891 Personal history of nicotine dependence: Secondary | ICD-10-CM

## 2019-07-13 DIAGNOSIS — Z8616 Personal history of COVID-19: Secondary | ICD-10-CM

## 2019-07-13 DIAGNOSIS — Z886 Allergy status to analgesic agent status: Secondary | ICD-10-CM

## 2019-07-13 DIAGNOSIS — D709 Neutropenia, unspecified: Secondary | ICD-10-CM

## 2019-07-13 LAB — CBC
HCT: 36.6 % — ABNORMAL LOW (ref 39.0–52.0)
Hemoglobin: 11.3 g/dL — ABNORMAL LOW (ref 13.0–17.0)
MCH: 25 pg — ABNORMAL LOW (ref 26.0–34.0)
MCHC: 30.9 g/dL (ref 30.0–36.0)
MCV: 81 fL (ref 80.0–100.0)
Platelets: 141 10*3/uL — ABNORMAL LOW (ref 150–400)
RBC: 4.52 MIL/uL (ref 4.22–5.81)
RDW: 20.8 % — ABNORMAL HIGH (ref 11.5–15.5)
WBC: 7.9 10*3/uL (ref 4.0–10.5)
nRBC: 0 % (ref 0.0–0.2)

## 2019-07-13 LAB — GLUCOSE, CAPILLARY
Glucose-Capillary: 120 mg/dL — ABNORMAL HIGH (ref 70–99)
Glucose-Capillary: 170 mg/dL — ABNORMAL HIGH (ref 70–99)
Glucose-Capillary: 117 mg/dL — ABNORMAL HIGH (ref 70–99)

## 2019-07-13 LAB — APTT
aPTT: 58 seconds — ABNORMAL HIGH (ref 24–36)
aPTT: >160 seconds (ref 24–36)
aPTT: 47 seconds — ABNORMAL HIGH (ref 24–36)

## 2019-07-13 MED ORDER — VANCOMYCIN HCL 2000 MG/400ML IV SOLN
2000.0000 mg | Freq: Once | INTRAVENOUS | Status: AC
  Administered 2019-07-13: 2000 mg via INTRAVENOUS
  Filled 2019-07-13: qty 400

## 2019-07-13 MED ORDER — ACETAMINOPHEN 325 MG PO TABS
650.0000 mg | ORAL_TABLET | Freq: Four times a day (QID) | ORAL | Status: DC | PRN
  Administered 2019-07-13: 650 mg via ORAL
  Filled 2019-07-13: qty 2

## 2019-07-13 MED ORDER — TORSEMIDE 20 MG PO TABS
40.0000 mg | ORAL_TABLET | Freq: Every evening | ORAL | Status: DC
  Administered 2019-07-13 – 2019-07-15 (×3): 40 mg via ORAL
  Filled 2019-07-13 (×5): qty 2

## 2019-07-13 MED ORDER — VANCOMYCIN HCL 500 MG/100ML IV SOLN
500.0000 mg | Freq: Once | INTRAVENOUS | Status: AC
  Administered 2019-07-14: 500 mg via INTRAVENOUS
  Filled 2019-07-13: qty 100

## 2019-07-13 MED ORDER — VANCOMYCIN HCL IN DEXTROSE 1-5 GM/200ML-% IV SOLN: 1000.0000 mg | INTRAVENOUS | Status: DC

## 2019-07-13 MED ORDER — BUDESONIDE 0.25 MG/2ML IN SUSP
2.0000 mL | Freq: Two times a day (BID) | RESPIRATORY_TRACT | Status: DC
  Administered 2019-07-13: 0.25 mg via RESPIRATORY_TRACT
  Filled 2019-07-13 (×2): qty 2

## 2019-07-13 MED ORDER — TORSEMIDE 20 MG PO TABS
80.0000 mg | ORAL_TABLET | Freq: Every morning | ORAL | Status: DC
  Administered 2019-07-13 – 2019-07-16 (×5): 80 mg via ORAL
  Filled 2019-07-13 (×5): qty 4

## 2019-07-13 MED ORDER — SODIUM CHLORIDE 0.9 % IV SOLN
INTRAVENOUS | Status: DC | PRN
  Administered 2019-07-13: 500 mL via INTRAVENOUS

## 2019-07-13 MED ORDER — APIXABAN 5 MG PO TABS
5.0000 mg | ORAL_TABLET | Freq: Two times a day (BID) | ORAL | Status: DC
  Administered 2019-07-13 – 2019-07-18 (×10): 5 mg via ORAL
  Filled 2019-07-13 (×10): qty 1

## 2019-07-13 MED ORDER — TECHNETIUM TO 99M ALBUMIN AGGREGATED
4.0000 | Freq: Once | INTRAVENOUS | Status: AC | PRN
  Administered 2019-07-13: 4.083 via INTRAVENOUS

## 2019-07-13 MED ORDER — HEPARIN BOLUS VIA INFUSION
2000.0000 [IU] | Freq: Once | INTRAVENOUS | Status: AC
  Administered 2019-07-13: 2000 [IU] via INTRAVENOUS
  Filled 2019-07-13: qty 2000

## 2019-07-13 MED ORDER — VANCOMYCIN HCL 1500 MG/300ML IV SOLN
1500.0000 mg | INTRAVENOUS | Status: DC
  Filled 2019-07-13: qty 300

## 2019-07-13 MED ORDER — VANCOMYCIN HCL 500 MG/100ML IV SOLN
500.0000 mg | Freq: Once | INTRAVENOUS | Status: DC
  Filled 2019-07-13: qty 100

## 2019-07-13 NOTE — Progress Notes (Signed)
Pharmacy Antibiotic Note  [name]. is a 59 y.o. male admitted on 07/12/2019 with fever of unknown origin.  Pharmacy has been consulted for Vancomycin dosing.  Plan: Vancomycin 2500 mg IV X 1 ordered to be given on 4/20 @ ~ 2100. Vancomycin 1500 mg IV Q24H ordered to start on 4/21 @ 2100.   AUC = 503.2 Vtrough = 11.1 Vd = 55.4 L  Ke = 0.054 hr-1 T1/2 = 12.9 hrs  Height: 5' 10.5" (179.1 cm) Weight: 110.7 kg (244 lb) IBW/kg (Calculated) : 74.15  Temp (24hrs), Avg:102.5 F (39.2 C), Min:102.5 F (39.2 C), Max:102.5 F (39.2 C)  Recent Labs  Lab 07/12/19 1230 07/13/19 0432  WBC 8.5 7.9  CREATININE 1.40*  --     Estimated Creatinine Clearance: 71.4 mL/min (A) (by C-G formula based on SCr of 1.4 mg/dL (H)).    Allergies  Allergen Reactions  . Acetaminophen Other (See Comments)    BC of transplant  . Contrast Media [Iodinated Diagnostic Agents]     Kidney transplant  . Ibuprofen Other (See Comments)    Due to kidney transplant Due to kidney transplant Due to kidney transplant    Antimicrobials this admission:   >>    >>   Dose adjustments this admission:   Microbiology results:  BCx:  UCx:    Sputum:    MRSA PCR:   Thank you for allowing pharmacy to be a part of this patient's care.  Edwinna Rochette D 07/13/2019 8:30 PM

## 2019-07-13 NOTE — ED Notes (Signed)
This RN sent message to pharmacy requesting medications to be tubed.

## 2019-07-13 NOTE — ED Notes (Signed)
Pt transported to nuclear medicine.

## 2019-07-13 NOTE — Progress Notes (Signed)
PROGRESS NOTE    [name].  YPP:[id number]  DOB: 09-Oct-1960  DOA: 07/12/2019 PCP: [name], MD Outpatient Specialists:   Hospital course:  Briefly, patient is a 59 year old man with multiple medical problems including status post renal transplantation in 2012 presently on Prograf, HFpEF, atrial fibrillation on Eliquis, CAD status post CABG in 2005 denies MI and pulmonary artery hypertension who was admitted 06/30/2019-07/04/2019 for COVID pneumonia. Patient had received first Covid vaccine 5 weeks previously, he had lost his mother to Covid earlier this year. Now readmitted for acute onset of left arm anterior chest wall pain.   While in the emergency room,  Left upper extremity venous Doppler revealed occluded cephalic vein at the AV fistula outflow tract in the antecubital fossa with no other thrombus identified.  Chest x-ray showed Central pulmonary vascular congestion without definite pulmonary edema or consolidative process.   Subjective:  Shoulder Pain & SOB improved some, but now febrile (Tmax 102.5)   Objective: Vitals:   07/13/19 1700 07/13/19 1730 07/13/19 1813 07/13/19 1915  BP: 134/70 (!) 143/72 (!) 142/83   Pulse: (!) 110 (!) 107 (!) 106   Resp:  (!) 25 20   Temp:   (!) 102.5 F (39.2 C)   TempSrc:   Oral   SpO2: 97% 99% 100% 100%  Weight:      Height:        Intake/Output Summary (Last 24 hours) at 07/13/2019 2002 Last data filed at 07/13/2019 0827 Gross per 24 hour  Intake 180.01 ml  Output 350 ml  Net -169.99 ml   Filed Weights   07/12/19 1229  Weight: 110.7 kg     Exam:  General: Tired appearing man sitting up in bed in no acute distress Eyes: sclera anicteric, conjuctiva mild injection bilaterally CVS: S1-S2, regular  Respiratory:  decreased air entry bilaterally secondary to body habitus.  No rales.   GI: NABS, soft, NT  LE: No edema.  Neuro: A/O x 3, Moving all extremities equally with normal strength, CN 3-12 intact,  grossly nonfocal.  Psych: patient is logical and coherent, judgement and insight appear normal, mood and affect appropriate to situation.   Data Reviewed: Assessment & Plan:   59 year old man with known combined CHF, status post renal transplant on tacrolimus, CAD and atrial fibrillation with recent admission 06/30/19-07/04/2019 for COVID pna. Now readmitted for acute chest pain  1.  Left AV fistula occlusion with cephalic vein thrombosis on Eliquis with acute respiratory failure with hypoxia of unclear etiology, rule out acute pulmonary embolism. -swtich IV heparin drip back to Eliquis as no vascular intervention planned -Pain management  -Vascular surgery input appreciated - no intervention planned as patient doesn't use LUE dialysis access and to avoid further kidney injury considering transplant status -O2 protocol will be followed. -pending VQ scan to rule out PE. -continue p.o. diuretics given central vascular congestion on chest x-ray   2.  Fever Tmax 102.1, no clear source - will order 2 sets of blood c/s and start vanco for now considering his transplant status - pharmacy to manage dosing - ID c/s  3.  Gout. -continue allopurinol.  4.  Type 2 diabetes mellitus. -SSI and lantus for now. DM nurse c/s  5.  Dyslipidemia. -continue statin therapy.  6.  BPH. -continue Flomax.  7.  Heart failure reduced ejection fraction - EF 45-50% Patient follows his dry weight very carefully and knows how to tailor his diuretics to weight. Follows with [facility] cardio. Will consider  c/s if need  8. Status post renal transplant/CKD 3A Continue Prograf/tacrolimus continue present meds Creatinine is 1.4 today   9. Permanent Atrial fibrillation Patient does not appear to be on any rate control medications per med rec however heart rate is low normal. Continue Eliquis  10. CAD Patient has remote history of CABG, denies history of MI in past Patient does not appear to be on a  beta-blocker on admission, possibly secondary to baseline bradycardia I do not see any antiplatelet agents on med rec, this can be addressed as an outpatient as warranted.  11. Depression Continue Wellbutrin  12. Hypertension. -continue Norvasc.   DVT prophylaxis: On Eliquis Code Status: Full Family Communication: Patient states family knows he is here Disposition Plan:   Patient is from: Home  Anticipated Discharge Location: Home  Barriers to Discharge: fever, pain, ID w/up  Is patient medically stable for Discharge: No   Consultants:  ID  Procedures:  None  Antimicrobials:  Vanco started on 07/01/8117   Basic Metabolic Panel: Recent Labs  Lab 07/12/19 1230  NA 136  K 5.3*  CL 98  CO2 27  GLUCOSE 202*  BUN 23*  CREATININE 1.40*  CALCIUM 9.2   Liver Function Tests: No results for input(s): AST, ALT, ALKPHOS, BILITOT, PROT, ALBUMIN in the last 168 hours. No results for input(s): LIPASE, AMYLASE in the last 168 hours. No results for input(s): AMMONIA in the last 168 hours. CBC: Recent Labs  Lab 07/12/19 1230 07/13/19 0432  WBC 8.5 7.9  HGB 11.5* 11.3*  HCT 37.3* 36.6*  MCV 80.7 81.0  PLT 148* 141*   Cardiac Enzymes: No results for input(s): CKTOTAL, CKMB, CKMBINDEX, TROPONINI in the last 168 hours. BNP (last 3 results) No results for input(s): PROBNP in the last 8760 hours. CBG: Recent Labs  Lab 07/13/19 1017 07/13/19 1257  GLUCAP 120* 170*    No results found for this or any previous visit (from the past 240 hour(s)).    Studies: DG Chest 2 View  Result Date: 07/12/2019 CLINICAL DATA:  Chest pain. EXAM: CHEST - 2 VIEW COMPARISON:  June 30, 2019. FINDINGS: Stable cardiomediastinal silhouette. Status post coronary artery bypass graft. Stable large calcified right paratracheal granuloma or lymph node is noted. No pneumothorax or pleural effusion is noted. Central pulmonary vascular congestion is noted. No definite pulmonary edema or  consolidative process is noted. Bony thorax is unremarkable. IMPRESSION: Central pulmonary vascular congestion is noted without definite pulmonary edema or consolidative process. Electronically Signed   By: Marijo Conception M.D.   On: 07/12/2019 13:43   US Venous Img Upper Uni Left  Result Date: 07/12/2019 CLINICAL DATA:  59 year old male with left arm pain. Evaluate for DVT. EXAM: Left UPPER EXTREMITY VENOUS DOPPLER ULTRASOUND TECHNIQUE: Gray-scale sonography with graded compression, as well as color Doppler and duplex ultrasound were performed to evaluate the upper extremity deep venous system from the level of the subclavian vein and including the jugular, axillary, basilic, radial, ulnar and upper cephalic vein. Spectral Doppler was utilized to evaluate flow at rest and with distal augmentation maneuvers. COMPARISON:  None. FINDINGS: Contralateral Subclavian Vein: Respiratory phasicity is normal and symmetric with the symptomatic side. No evidence of thrombus. Normal compressibility. Internal Jugular Vein: No evidence of thrombus. Normal compressibility, respiratory phasicity and response to augmentation. Subclavian Vein: No evidence of thrombus. Normal compressibility, respiratory phasicity and response to augmentation. Axillary Vein: No evidence of thrombus. Normal compressibility, respiratory phasicity and response to augmentation. Cephalic Vein: There is occlusive  thrombus in the distal cephalic vein at the antecubital fossa at the site of fistula anastomosis. Basilic Vein: No evidence of thrombus. Normal compressibility, respiratory phasicity and response to augmentation. Brachial Veins: No evidence of thrombus. Normal compressibility, respiratory phasicity and response to augmentation. Radial Veins: No evidence of thrombus. Normal compressibility, respiratory phasicity and response to augmentation. Ulnar Veins: No evidence of thrombus. Normal compressibility, respiratory phasicity and response to  augmentation. Venous Reflux:  None visualized. Other Findings:  None visualized. IMPRESSION: Occluded cephalic vein at the AV fistula outflow tract in the antecubital fossa. No other thrombus identified. These results were called by telephone at the time of interpretation on 07/12/2019 at 8:12 pm to provider Merlyn Lot , who verbally acknowledged these results. Electronically Signed   By: Anner Crete M.D.   On: 07/12/2019 20:30     Scheduled Meds: . amLODipine  5 mg Oral Daily  . vitamin C  250 mg Oral Daily  . budesonide  2 mL Inhalation BID  . buPROPion  150 mg Oral BID  . ferrous sulfate  650 mg Oral BID  . gabapentin  300 mg Oral QHS  . insulin aspart  0-15 Units Subcutaneous TID PC & HS  . insulin glargine  12 Units Subcutaneous QHS  . montelukast  10 mg Oral QHS  . multivitamin with minerals  1 tablet Oral Daily  . mycophenolate  360 mg Oral BID  . pantoprazole  40 mg Oral Daily  . rosuvastatin  10 mg Oral Daily  . tacrolimus  1 mg Oral BID  . tamsulosin  0.4 mg Oral Daily  . torsemide  80 mg Oral q AM   And  . torsemide  40 mg Oral QPM  . zinc sulfate  220 mg Oral BID   Continuous Infusions: . heparin 1,600 Units/hr (07/13/19 1623)    Active Problems:   AV fistula occlusion ([place])     Trellis Vanoverbeke Manuella Ghazi, [facility]  If 7PM-7AM, please contact night-coverage [email or web address] Password [email or web address] 07/13/2019, 8:02 PM    LOS: 1 day

## 2019-07-13 NOTE — Consult Note (Signed)
[facility] Vascular Consult Note  MRN : [id number]  Carl Ryun Velez. is a 59 y.o. (1960-11-28) male who presents with chief complaint of  Chief Complaint  Patient presents with  . Weakness   History of Present Illness:  The patient is a 59 year old male with multiple medical issues (see below) including end-stage renal disease status post renal transplant in 2012 who presented to the [facility] emergency department with a chief complaint of left upper extremity pain and swelling.  The patient endorses a history of progressively worsening left upper extremity discomfort which started on Friday.  The patient notes that he has left upper extremity dialysis access which he no longer uses.  Left upper extremity venous duplex was notable for:  Occluded cephalic vein at the AV fistula outflow tract in the antecubital fossa. No other thrombus identified.   He denies any shortness of breath.  Denies any fever, nausea or vomiting.  Vascular surgery was consulted by Dr. Sidney Ace for possible intervention.   Current Facility-Administered Medications  Medication Dose Route Frequency Provider Last Rate Last Admin  . amLODipine (NORVASC) tablet 5 mg  5 mg Oral Daily Mansy, Jan A, MD   5 mg at 07/13/19 1020  . ascorbic acid (VITAMIN C) tablet 250 mg  250 mg Oral Daily Mansy, Jan A, MD   250 mg at 07/13/19 1020  . benzonatate (TESSALON) capsule 200 mg  200 mg Oral TID PRN Mansy, Jan A, MD      . budesonide (PULMICORT) nebulizer solution 0.25 mg  2 mL Inhalation BID Mansy, Jan A, MD   0.25 mg at 07/13/19 1047  . buPROPion [facility] SR) 12 hr tablet 150 mg  150 mg Oral BID Mansy, Jan A, MD   150 mg at 07/13/19 1038  . colchicine tablet 0.6 mg  0.6 mg Oral Daily PRN Mansy, Jan A, MD   0.6 mg at 07/13/19 1036  . ferrous sulfate tablet 650 mg  650 mg Oral BID Mansy, Jan A, MD   650 mg at 07/13/19 1036  . gabapentin (NEURONTIN) capsule 300 mg  300 mg  Oral QHS Mansy, Jan A, MD   300 mg at 07/13/19 0047  . heparin ADULT infusion 100 units/mL (25000 units/270m sodium chloride 0.45%)  1,600 Units/hr Intravenous Continuous POswald Hillock RPH 16 mL/hr at 07/13/19 1623 1,600 Units/hr at 07/13/19 1623  . HYDROmorphone (DILAUDID) injection 0.5 mg  0.5 mg Intravenous Q2H PRN RMerlyn Lot MD   0.5 mg at 07/13/19 1224  . insulin aspart (novoLOG) injection 0-15 Units  0-15 Units Subcutaneous TID PC & HS Mansy, JArvella Merles MD   3 Units at 07/13/19 1302  . insulin glargine (LANTUS) injection 12 Units  12 Units Subcutaneous QHS Mansy, JArvella Merles MD   12 Units at 07/13/19 0048  . iohexol (OMNIPAQUE) 350 MG/ML injection 100 mL  100 mL Intravenous Once PRN RMerlyn Lot MD      . magnesium hydroxide (MILK OF MAGNESIA) suspension 30 mL  30 mL Oral Daily PRN Mansy, Jan A, MD      . metolazone (ZAROXOLYN) tablet 2.5 mg  2.5 mg Oral Daily PRN Mansy, Jan A, MD      . montelukast (SINGULAIR) tablet 10 mg  10 mg Oral QHS Mansy, Jan A, MD   10 mg at 07/13/19 0104  . multivitamin with minerals tablet 1 tablet  1 tablet Oral Daily Mansy, Jan A, MD   1 tablet at 07/13/19 1021  .  mycophenolate (MYFORTIC) EC tablet 360 mg  360 mg Oral BID Mansy, Jan A, MD   360 mg at 07/13/19 1039  . ondansetron (ZOFRAN) tablet 4 mg  4 mg Oral Q6H PRN Mansy, Jan A, MD       Or  . ondansetron [facility]) injection 4 mg  4 mg Intravenous Q6H PRN Mansy, Jan A, MD      . oxyCODONE (Oxy IR/ROXICODONE) immediate release tablet 10 mg  10 mg Oral Q3H PRN Merlyn Lot, MD   10 mg at 07/13/19 1442  . pantoprazole (PROTONIX) EC tablet 40 mg  40 mg Oral Daily Mansy, Jan A, MD   40 mg at 07/13/19 1021  . rosuvastatin (CRESTOR) tablet 10 mg  10 mg Oral Daily Mansy, Jan A, MD      . tacrolimus (PROGRAF) capsule 1 mg  1 mg Oral BID Mansy, Jan A, MD   1 mg at 07/13/19 1037  . tamsulosin (FLOMAX) capsule 0.4 mg  0.4 mg Oral Daily Mansy, Jan A, MD   0.4 mg at 07/13/19 1021  . torsemide (DEMADEX) tablet  80 mg  80 mg Oral q AM Mansy, Jan A, MD   80 mg at 07/13/19 4431   And  . torsemide (DEMADEX) tablet 40 mg  40 mg Oral QPM Mansy, Jan A, MD      . traMADol Veatrice Bourbon) tablet 50 mg  50 mg Oral TID PRN Mansy, Jan A, MD      . zinc sulfate capsule 220 mg  220 mg Oral BID Mansy, Jan A, MD   220 mg at 07/13/19 1021   Current Outpatient Medications  Medication Sig Dispense Refill  . amLODipine (NORVASC) 5 MG tablet Take 1 tablet (5 mg total) by mouth daily. 90 tablet 2  . apixaban (ELIQUIS) 5 MG TABS tablet Take 1 tablet (5 mg total) by mouth 2 (two) times daily. 180 tablet 1  . beclomethasone (QVAR) 80 MCG/ACT inhaler Inhale 2 puffs into the lungs 2 (two) times daily.    Marland Kitchen buPROPion (WELLBUTRIN SR) 150 MG 12 hr tablet TAKE 1 TABLET BY MOUTH TWICE DAILY (Patient taking differently: Take 150 mg by mouth 2 (two) times daily. ) 180 tablet 3  . dapagliflozin propanediol (FARXIGA) 10 MG TABS tablet Take 10 mg by mouth daily before breakfast. 30 tablet 6  . gabapentin (NEURONTIN) 300 MG capsule Take 300 mg by mouth at bedtime.     Marland Kitchen HUMALOG KWIKPEN 100 UNIT/ML KwikPen Inject 5-10 Units into the skin 3 (three) times daily. Sliding scale    . insulin glargine (LANTUS) 100 unit/mL SOPN Inject 0.1 mLs (10 Units total) into the skin at bedtime. Currently using 15 units (Patient taking differently: Inject 10-15 Units into the skin at bedtime. ) 15 mL 11  . lisinopril (ZESTRIL) 40 MG tablet Take 0.5 tablets (20 mg total) by mouth daily. 90 tablet 3  . montelukast (SINGULAIR) 10 MG tablet TAKE 1 TABLET BY MOUTH AT BEDTIME (Patient taking differently: Take 10 mg by mouth at bedtime. ) 30 tablet 0  . Multiple Vitamin (MULTIVITAMIN WITH MINERALS) TABS tablet Take 1 tablet by mouth daily.    Marland Kitchen omeprazole (PRILOSEC) 20 MG capsule TAKE 1 CAPSULE BY MOUTH ONCE DAILY (Patient taking differently: Take 20 mg by mouth daily. ) 90 capsule 3  . potassium chloride (MICRO-K) 10 MEQ CR capsule Take 4 capsules (40 mEq total) by mouth  every morning AND 2 capsules (20 mEq total) every evening. 360 capsule 4  . rosuvastatin (  CRESTOR) 10 MG tablet TAKE 1 TABLET BY MOUTH DAILY (Patient taking differently: Take 10 mg by mouth daily. ) 90 tablet 3  . tacrolimus (PROGRAF) 1 MG capsule Take 1 mg by mouth 2 (two) times daily.    . tamsulosin (FLOMAX) 0.4 MG CAPS capsule Take 0.4 mg by mouth daily.     Marland Kitchen torsemide (DEMADEX) 20 MG tablet Take torsemide 80 mg in am and 40 mg in pm (Patient taking differently: Take 40 mg by mouth 2 (two) times daily. ) 210 tablet 3  . vitamin C (ASCORBIC ACID) 250 MG tablet Take 1 tablet (250 mg total) by mouth daily. 30 tablet 0  . benzonatate (TESSALON) 200 MG capsule Take 1 capsule (200 mg total) by mouth 3 (three) times daily as needed for cough. 60 capsule 0  . colchicine 0.6 MG tablet Take 1 tablet (0.6 mg total) by mouth daily. (Patient taking differently: Take 0.6 mg by mouth daily as needed (gout flare). ) 60 tablet 11  . fluticasone (FLONASE) 50 MCG/ACT nasal spray PLACE 2 SPRAYS INTO BOTH NOSTRILS DAILY. 16 g 11  . traMADol (ULTRAM) 50 MG tablet Take 1 tablet (50 mg total) by mouth 3 (three) times daily as needed. (Patient not taking: Reported on 07/13/2019) 20 tablet 0  . zinc sulfate 220 (50 Zn) MG capsule Take 1 capsule (220 mg total) by mouth 2 (two) times daily. (Patient not taking: Reported on 07/13/2019) 60 capsule 0   Past Medical History:  Diagnosis Date  . Allergy    seasonal  . Amnestic MCI (mild cognitive impairment with memory loss)   . Asthma   . Benign neoplasm of colon   . CAD (coronary artery disease) 2005   a.  Status post four-vessel CABG in 2005; b. 03/2018 MV: Small, fixed inferoapical and apical septal defect. No ischemia. EF 47% (60-65% by 05/2018 Echo).  . Chronic atrial fibrillation ([place])    a. CHADS2VASc 4 (CHF, HTN, DM, vascular disease)--> Eliquis; b. 09/2018 Zio: Chronic Afib.  . Chronic combined systolic and diastolic CHF (congestive heart failure) ([place])    a.  7/18  Echo: EF 45-50%; b. 05/2018 Echo: EF 60-65%, RVSP 74.8; c. 12/2018 Echo: EF 50-55%. Sev dil LA.  Marland Kitchen Chronic venous stasis dermatitis of both lower extremities   . Cytomegaloviral disease ([place]) 2017  . Depression   . Diabetes mellitus   . Diverticulosis   . Dyspnea   . Erectile dysfunction   . ESRD (end stage renal disease) ([place]) 2005   a. HD 2004-2012; b. S/p renal transplant in 2012  . GERD (gastroesophageal reflux disease)   . Gout   . Hyperlipidemia    low since renal failure  . Hypertension   . Kidney transplant status, cadaveric 2012  . Obesity   . PAH (pulmonary artery hypertension) ([place])    a. 05/2018 Echo: RVSP 74.50mHg; b. 12/2018 RV not well visualized.  . Pneumonia   . [place]   . Sleep apnea    bipap with oxygen 2 l  . Trigger finger   . Ulcer 05/2016   Left shin  . Wears glasses    Past Surgical History:  Procedure Laterality Date  . BARIATRIC SURGERY    . CARDIAC CATHETERIZATION     [facility]  . CARDIAC CATHETERIZATION  05/03/2019  . CATARACT EXTRACTION W/PHACO Right 10/29/2017   Procedure: CATARACT EXTRACTION PHACO AND INTRAOCULAR LENS PLACEMENT ([place]  RIGHT DIABETIC;  Surgeon: BLeandrew Koyanagi MD;  [place]  Service: Ophthalmology;  Laterality: Right;  Diabetic - insulin  . CATARACT EXTRACTION W/PHACO Left 11/18/2017   Procedure: CATARACT EXTRACTION PHACO AND INTRAOCULAR LENS PLACEMENT ([place]) LEFT IVA/TOPICAL;  Surgeon: [name], MD;  Location: [place];  Service: Ophthalmology;  Laterality: Left;  DIABETES - insulin sleep apnea  . COLONOSCOPY WITH PROPOFOL N/A 04/22/2013   Procedure: COLONOSCOPY WITH PROPOFOL;  Surgeon: [name], MD;  Location: [facility];  Service: Endoscopy;  Laterality: N/A;  . CORONARY ARTERY BYPASS GRAFT  2005   X 4  . DG ANGIO AV SHUNT*L*     right and left upper arms  . FASCIOTOMY  03/03/2012   Procedure: FASCIOTOMY;  Surgeon: [name], MD;  Location: [place];  Service: Orthopedics;  Laterality: Right;  FASCIOTOMY RIGHT SMALL FINGER  . FASCIOTOMY Left 08/17/2013   Procedure: FASCIOTOMY LEFT RING;  Surgeon: [name], MD;  Location: [place];  Service: Orthopedics;  Laterality: Left;  . INCISION AND DRAINAGE ABSCESS Left 10/15/2015   Procedure: INCISION AND DRAINAGE ABSCESS;  Surgeon: [name], MD;  Location: [facility];  Service: General;  Laterality: Left;  . KIDNEY TRANSPLANT  09/13/2010   cadaver--at [facility]  . RIGHT HEART CATH N/A 11/15/2016   Procedure: RIGHT HEART CATH;  Surgeon: [name], MD;  Location: [place] [facility];  Service: Cardiovascular;  Laterality: N/A;  . RIGHT HEART CATH N/A 05/03/2019   Procedure: RIGHT HEART CATH;  Surgeon: [name], MD;  Location: [place] [facility];  Service: Cardiovascular;  Laterality: N/A;  . TYMPANIC MEMBRANE REPAIR  1/12   left  . VASECTOMY     Social History Social History   Tobacco Use  . Smoking status: Former Smoker    Types: Cigars    Quit date: 09/02/1994    Years since quitting: 24.8  . Smokeless tobacco: Never Used  Substance Use Topics  . Alcohol use: Yes    Alcohol/week: 0.0 - 1.0 standard drinks    Comment: occasional- 3 in the past year  . Drug use: No   Family History Family History  Problem Relation Age of Onset  . Heart disease Father   . Kidney failure Father   . Kidney disease Father   . Diabetes Maternal Grandmother   . Breast cancer Maternal Grandmother   . Valvular heart disease Mother   . Liver cancer Paternal Uncle   . Liver cancer Paternal Grandmother   . Prostate cancer Neg Hx   Positive for renal disease.  Denies peripheral artery disease or venous disease.  Allergies  Allergen Reactions  . Acetaminophen Other (See Comments)    BC of transplant  . Contrast Media [Iodinated Diagnostic Agents]     Kidney transplant  . Ibuprofen Other (See Comments)    Due to kidney transplant Due to kidney transplant Due to  kidney transplant   REVIEW OF SYSTEMS (Negative unless checked)  Constitutional: _0 Weight loss  _1 Fever  _2 Chills Cardiac: _3 Chest pain   _4 Chest pressure   _5 Palpitations   _6 Shortness of breath when laying flat   _7 Shortness of breath at rest   _8 Shortness of breath with exertion. Vascular:  _9 Pain in legs with walking   _10 Pain in legs at rest   _11 Pain in legs when laying flat   _12 Claudication   _13 Pain in feet when walking  _14 Pain in feet at rest  _15 Pain in feet when laying flat   _16 History of DVT   _17 Phlebitis   _18 Swelling in legs   _19   Varicose veins   _0 Non-healing ulcers Pulmonary:   _1 Uses home oxygen   _2 Productive cough   _3 Hemoptysis   _4 Wheeze  _5 COPD   _6 Asthma Neurologic:  _7 Dizziness  _8 Blackouts   _9 Seizures   _10 History of stroke   _11 History of TIA  _12 Aphasia   _13 Temporary blindness   _14 Dysphagia   _15 Weakness or numbness in arms   _16 Weakness or numbness in legs Musculoskeletal:  _17 Arthritis   _18 Joint swelling   _19 Joint pain   _20 Low back pain Hematologic:  _21 Easy bruising  _22 Easy bleeding   _23 Hypercoagulable state   _24 Anemic  _25 Hepatitis Gastrointestinal:  _26 Blood in stool   _27 Vomiting blood  _28 Gastroesophageal reflux/heartburn   _29 Difficulty swallowing. Genitourinary:  _30 Chronic kidney disease   _31 Difficult urination  _32 Frequent urination  _33 Burning with urination   _34 Blood in urine Skin:  _35 Rashes   _36 Ulcers   _37 Wounds Psychological:  _38 History of anxiety   _39  History of major depression.  Positive for left upper extremity pain and swelling.  Physical Examination  Vitals:   07/13/19 1437 07/13/19 1500 07/13/19 1530 07/13/19 1600  BP: (!) 160/80 (!) 144/70 128/71 113/67  Pulse: 98 98 (!) 106 (!) 110  Resp: (!) 27 (!) 31 (!) 26 (!) 35  Temp:      SpO2:  98% 98% 92%  Weight:      Height:       Body mass index is 34.52 kg/m. Gen:  WD/WN, NAD Head: [place]/AT, No temporalis wasting. Prominent temp pulse not noted. Ear/Nose/Throat: Hearing grossly intact, nares w/o erythema or  drainage, oropharynx w/o Erythema/Exudate Eyes: Sclera non-icteric, conjunctiva clear Neck: Trachea midline.  No JVD.  Pulmonary:  Good air movement, respirations not labored, equal bilaterally.  Cardiac: RRR, normal S1, S2. Vascular:  Vessel Right Left  Radial Palpable Palpable  Ulnar Palpable Palpable  Brachial Palpable Palpable  Carotid Palpable, without bruit Palpable, without bruit  Aorta Not palpable N/A  Femoral Palpable Palpable  Popliteal Palpable Palpable  PT Palpable Palpable  DP Palpable Palpable   Left upper extremity: Mild edema.  Tender to palpation.  Motor/sensory is intact.  There is no acute vascular compromise noted at this time.  No bruit or thrill in fistula.  Unable to palpate arterial anastomosis. 2+ radial pulse.  Gastrointestinal: soft, non-tender/non-distended. No guarding/reflex.  Musculoskeletal: M/S 5/5 throughout.  Extremities without ischemic changes.  No deformity or atrophy.  Neurologic: Sensation grossly intact in extremities.  Symmetrical.  Speech is fluent. Motor exam as listed above. Psychiatric: Judgment intact, Mood & affect appropriate for pt's clinical situation. Dermatologic: No rashes or ulcers noted.  No cellulitis or open wounds. Lymph : No Cervical, Axillary, or Inguinal lymphadenopathy.  CBC Lab Results  Component Value Date   WBC 7.9 07/13/2019   HGB 11.3 (L) 07/13/2019   HCT 36.6 (L) 07/13/2019   MCV 81.0 07/13/2019   PLT 141 (L) 07/13/2019   BMET    Component Value Date/Time   NA 136 07/12/2019 1230   NA 137 06/10/2019 1019   NA 141 06/16/2014 0911   K 5.3 (H) 07/12/2019 1230   K 3.4 (L) 06/16/2014 0911   CL 98 07/12/2019 1230   CL 104 06/16/2014 0911   CO2 27 07/12/2019 1230   CO2 30 06/16/2014 0911   GLUCOSE 202 (H) 07/12/2019 1230   GLUCOSE 139 (H) 06/16/2014 0911   BUN 23 (H) 07/12/2019 1230   BUN 48 (H) 06/10/2019 1019   BUN 13 06/16/2014 0911   CREATININE 1.40 (H) 07/12/2019 1230   CREATININE 0.83 06/16/2014  4742   CALCIUM 9.2 07/12/2019 1230   CALCIUM 9.1 06/16/2014 0911   GFRNONAA 55 (L) 07/12/2019 1230   GFRNONAA >60 06/16/2014 0911   GFRAA >60 07/12/2019 1230   GFRAA >60 06/16/2014 0911   Estimated Creatinine Clearance: 71.4 mL/min (A) (by C-G formula based on SCr of 1.4 mg/dL (H)).  COAG Lab Results  Component Value Date   INR 1.8 (H) 07/12/2019   INR 1.7 (H) 06/30/2019   INR 2.0 (H) 04/30/2019   Radiology DG Chest 2 View  Result Date: 07/12/2019 CLINICAL DATA:  Chest pain. EXAM: CHEST - 2 VIEW COMPARISON:  June 30, 2019. FINDINGS: Stable cardiomediastinal silhouette. Status post coronary artery bypass graft. Stable large calcified right paratracheal granuloma or lymph node is noted. No pneumothorax or pleural effusion is noted. Central pulmonary vascular congestion is noted. No definite pulmonary edema or consolidative process is noted. Bony thorax is unremarkable. IMPRESSION: Central pulmonary vascular congestion is noted without definite pulmonary edema or consolidative process. Electronically Signed   By: Marijo Conception M.D.   On: 07/12/2019 13:43   CT CHEST WO CONTRAST  Result Date: 07/01/2019 CLINICAL DATA:  Hypoxia.  COVID-19 positive EXAM: CT CHEST WITHOUT CONTRAST TECHNIQUE: Multidetector CT imaging of the chest was performed following the standard protocol without IV contrast. COMPARISON:  January 16, 2019 chest CT; chest radiograph June 30, 2018 FINDINGS: Cardiovascular: There is no thoracic aortic aneurysm. There are scattered foci of aortic atherosclerosis as well as scattered foci of great vessel calcification. Patient is status post coronary artery bypass grafting. There is extensive native coronary artery calcification. There is cardiomegaly with calcification in the mitral valve. There is no pericardial effusion or pericardial thickening. There is prominence of the main pulmonary outflow tract measuring 3.5 cm. Mediastinum/Nodes: Visualized thyroid appears normal. There are  multiple calcified lymph nodes consistent with prior granulomatous disease. A right paratracheal lymph node measures 3.1 x 2.8 cm with diffuse calcification, consistent with granulomatous disease change. Other smaller calcified lymph nodes noted as well. There are multiple noncalcified subcentimeter in size lymph nodes. No noncalcified lymph nodes present meeting size criteria for pathologic significance. Small hiatal hernia present. Lungs/Pleura: There is extensive ground-glass type opacity throughout the lungs bilaterally with involvement to varying degrees of most lobes in segments. Both upper and lower lobe areas of ground-glass type opacity noted. There is mild consolidation in the posterior segment right upper lobe. No other appreciable consolidation noted. No appreciable pleural effusions. Upper Abdomen: In the visualized upper abdomen, there is evidence of previous gastric surgery without wall thickening. Incomplete visualization of spleen. Spleen measures 17.9 cm from anterior to posterior dimension, enlarged. There are multiple foci of arterial vascular calcification in the upper abdominal aorta and multiple mesenteric arteries. Musculoskeletal: Status post median sternotomy. Multiple foci of degenerative change noted in the lower thoracic spine. No blastic or lytic bone lesions. No evident chest wall lesions. IMPRESSION: 1. Multifocal pneumonia, likely of atypical organism etiology. Mild consolidation posterior segment right upper lobe. Most areas of opacification have a ground-glass type opacity appearance. 2. Evidence of prior granulomatous disease. Enlarged right paratracheal lymph node is calcified consistent with prior granulomatous disease. No frank adenopathy by size criteria involving noncalcified lymph nodes. 3. Aortic atherosclerosis. Multiple foci of native coronary artery calcification with evidence of previous coronary artery bypass grafting. Foci of aortic atherosclerosis as well as  calcification in great vessels and multiple mesenteric arterial vessels noted. 4. Enlargement of the main pulmonary outflow tract is indicative of a degree of  pulmonary arterial hypertension. 5.  Cardiomegaly.  Calcification of mitral valve. 6. Apparent splenomegaly with spleen incompletely visualized on this study. Aortic Atherosclerosis (ICD10-I70.0). Electronically Signed   By: Lowella Grip III M.D.   On: 07/01/2019 11:23   US Venous Img Upper Uni Left  Result Date: 07/12/2019 CLINICAL DATA:  59 year old male with left arm pain. Evaluate for DVT. EXAM: Left UPPER EXTREMITY VENOUS DOPPLER ULTRASOUND TECHNIQUE: Gray-scale sonography with graded compression, as well as color Doppler and duplex ultrasound were performed to evaluate the upper extremity deep venous system from the level of the subclavian vein and including the jugular, axillary, basilic, radial, ulnar and upper cephalic vein. Spectral Doppler was utilized to evaluate flow at rest and with distal augmentation maneuvers. COMPARISON:  None. FINDINGS: Contralateral Subclavian Vein: Respiratory phasicity is normal and symmetric with the symptomatic side. No evidence of thrombus. Normal compressibility. Internal Jugular Vein: No evidence of thrombus. Normal compressibility, respiratory phasicity and response to augmentation. Subclavian Vein: No evidence of thrombus. Normal compressibility, respiratory phasicity and response to augmentation. Axillary Vein: No evidence of thrombus. Normal compressibility, respiratory phasicity and response to augmentation. Cephalic Vein: There is occlusive thrombus in the distal cephalic vein at the antecubital fossa at the site of fistula anastomosis. Basilic Vein: No evidence of thrombus. Normal compressibility, respiratory phasicity and response to augmentation. Brachial Veins: No evidence of thrombus. Normal compressibility, respiratory phasicity and response to augmentation. Radial Veins: No evidence of thrombus.  Normal compressibility, respiratory phasicity and response to augmentation. Ulnar Veins: No evidence of thrombus. Normal compressibility, respiratory phasicity and response to augmentation. Venous Reflux:  None visualized. Other Findings:  None visualized. IMPRESSION: Occluded cephalic vein at the AV fistula outflow tract in the antecubital fossa. No other thrombus identified. These results were called by telephone at the time of interpretation on 07/12/2019 at 8:12 pm to provider Merlyn Lot , who verbally acknowledged these results. Electronically Signed   By: Anner Crete M.D.   On: 07/12/2019 20:30   DG Chest Port 1 View  Result Date: 06/30/2019 CLINICAL DATA:  59 year old male with cough and congestion. EXAM: PORTABLE CHEST 1 VIEW COMPARISON:  Chest radiograph dated 05/04/2019. FINDINGS: There is cardiomegaly with vascular congestion and probable edema. Diffuse interstitial nodularity may be related to vascular congestion although developing infiltrate is not excluded clinical correlation is recommended. There is no pleural effusion or pneumothorax. Calcified right paratracheal granuloma. Median sternotomy wires and CABG vascular clips. No acute osseous pathology. IMPRESSION: Cardiomegaly with findings of CHF. Pneumonia is not excluded. Electronically Signed   By: Anner Crete M.D.   On: 06/30/2019 22:34   Assessment/Plan The patient is a 59 year old male with multiple medical issues (see below) including end-stage renal disease status post renal transplant in 2012 who presented to the [facility] emergency department with a chief complaint of left upper extremity pain and swelling.  Found to have occluded cephalic vein at the AV fistula outflow tract in the antecubital fossa.  1.  Occluded celiac vein at the AV fistula outflow tract: Patient is s/p kidney transplant in 2012.  He no longer uses this left upper extremity dialysis access. Due to his transplant status,  would not recommend any endovascular intervention as this would increase his risk of renal injury.  The patient understands this and is in agreement.  If pain control becomes an issue could consider ligation of the fistula.   2.  Diabetes: On appropriate medications. Encouraged good control as its slows the progression of atherosclerotic disease.  3.  Hyperlipidemia: On statin for medical management. Encouraged good control as its slows the progression of atherosclerotic disease.  Discussed with Dr. Mayme Genta, PA-C  07/13/2019 5:36 PM  This note was created with Dragon medical transcription system.  Any error is purely unintentional.

## 2019-07-13 NOTE — ED Notes (Signed)
Pt resting in bed, given warm blankets and elevated right leg for comfort.

## 2019-07-13 NOTE — ED Notes (Signed)
This RN is unable to obtain blue top from this pt, lab called and states they will come draw lab when they can.

## 2019-07-13 NOTE — Consult Note (Signed)
ANTICOAGULATION CONSULT NOTE - Initial Consult  Pharmacy Consult for heparin infusion / Eliquis Indication: VTE Treatment   Allergies  Allergen Reactions  . Acetaminophen Other (See Comments)    BC of transplant  . Contrast Media [Iodinated Diagnostic Agents]     Kidney transplant  . Ibuprofen Other (See Comments)    Due to kidney transplant Due to kidney transplant Due to kidney transplant    Patient Measurements: Height: 5' 10.5" (179.1 cm) Weight: 110.7 kg (244 lb) IBW/kg (Calculated) : 74.15 Heparin Dosing Weight: 98 kg  Vital Signs: Temp: 102.5 F (39.2 C) (04/20 1813) Temp Source: Oral (04/20 1813) BP: 104/54 (04/20 2015) Pulse Rate: 109 (04/20 2015)  Labs: Recent Labs    07/12/19 1230 07/12/19 1524 07/12/19 2140 07/13/19 0432 07/13/19 0649 07/13/19 1505  HGB 11.5*  --   --  11.3*  --   --   HCT 37.3*  --   --  36.6*  --   --   PLT 148*  --   --  141*  --   --   APTT  --   --  43*  --  58* >160*  LABPROT 21.0*  --   --   --   --   --   INR 1.8*  --   --   --   --   --   HEPARINUNFRC  --   --  1.71*  --   --   --   CREATININE 1.40*  --   --   --   --   --   TROPONINIHS 20* 20*  --   --   --   --     Estimated Creatinine Clearance: 71.4 mL/min (A) (by C-G formula based on SCr of 1.4 mg/dL (H)).   Medical History: Past Medical History:  Diagnosis Date  . Allergy    seasonal  . Amnestic MCI (mild cognitive impairment with memory loss)   . Asthma   . Benign neoplasm of colon   . CAD (coronary artery disease) 2005   a.  Status post four-vessel CABG in 2005; b. 03/2018 MV: Small, fixed inferoapical and apical septal defect. No ischemia. EF 47% (60-65% by 05/2018 Echo).  . Chronic atrial fibrillation ([place])    a. CHADS2VASc 4 (CHF, HTN, DM, vascular disease)--> Eliquis; b. 09/2018 Zio: Chronic Afib.  . Chronic combined systolic and diastolic CHF (congestive heart failure) ([place])    a.  7/18 Echo: EF 45-50%; b. 05/2018 Echo: EF 60-65%, RVSP 74.8; c. 12/2018  Echo: EF 50-55%. Sev dil LA.  Marland Kitchen Chronic venous stasis dermatitis of both lower extremities   . Cytomegaloviral disease ([place]) 2017  . Depression   . Diabetes mellitus   . Diverticulosis   . Dyspnea   . Erectile dysfunction   . ESRD (end stage renal disease) ([place]) 2005   a. HD 2004-2012; b. S/p renal transplant in 2012  . GERD (gastroesophageal reflux disease)   . Gout   . Hyperlipidemia    low since renal failure  . Hypertension   . Kidney transplant status, cadaveric 2012  . Obesity   . PAH (pulmonary artery hypertension) ([place])    a. 05/2018 Echo: RVSP 74.56mHg; b. 12/2018 RV not well visualized.  . Pneumonia   . [place]   . Sleep apnea    bipap with oxygen 2 l  . Trigger finger   . Ulcer 05/2016   Left shin  . Wears glasses     Assessment: Pharmacy consulted  for heparin infusion dosing and monitoring for 59 yo male admitted with Occluded cephalic vein.  Patient has PMH of A. Fib and was taking apixaban PTA. Last dose reported 0800 am 4/19.   Pt given 5500 unit bolus, followed by an initial rate of 1550 units/hr  4/20 0649 APTT 58  Goal of Therapy:  Heparin level 0.3-0.7 units/ml aPTT 66-102 seconds Monitor platelets by anticoagulation protocol: Yes   Plan:  APTT is supratherapeutic. Will decrease heparin infusion to 1600 units/hr. Will need to dose by aPTT levels until aptt and heparin level correlate. Check anti-Xa level/aptt in 6 hours and daily while on heparin Continue to monitor H&H and platelets  4/20 @ 2030 :  Dr Manuella Ghazi wants to transition this pt to Eliquis .  Will d/c heparin and resume pt's home dose of Eliquis 5 mg PO BID.   Orene Desanctis, PharmD Clinical Pharmacist 07/13/2019 8:36 PM

## 2019-07-13 NOTE — Consult Note (Signed)
ANTICOAGULATION CONSULT NOTE - Initial Consult  Pharmacy Consult for heparin infusion  Indication: VTE Treatment   Allergies  Allergen Reactions  . Morphine Itching  . Acetaminophen Other (See Comments)    BC of transplant  . Ibuprofen Other (See Comments)    Due to kidney transplant Due to kidney transplant Due to kidney transplant    Patient Measurements: Height: 5' 10.5" (179.1 cm) Weight: 110.7 kg (244 lb) IBW/kg (Calculated) : 74.15 Heparin Dosing Weight: 98 kg  Vital Signs: BP: 150/79 (04/20 0730) Pulse Rate: 82 (04/20 0730)  Labs: Recent Labs    07/12/19 1230 07/12/19 1524 07/12/19 2140 07/13/19 0432 07/13/19 0649  HGB 11.5*  --   --  11.3*  --   HCT 37.3*  --   --  36.6*  --   PLT 148*  --   --  141*  --   APTT  --   --  43*  --  58*  LABPROT 21.0*  --   --   --   --   INR 1.8*  --   --   --   --   HEPARINUNFRC  --   --  1.71*  --   --   CREATININE 1.40*  --   --   --   --   TROPONINIHS 20* 20*  --   --   --     Estimated Creatinine Clearance: 71.4 mL/min (A) (by C-G formula based on SCr of 1.4 mg/dL (H)).   Medical History: Past Medical History:  Diagnosis Date  . Allergy    seasonal  . Amnestic MCI (mild cognitive impairment with memory loss)   . Asthma   . Benign neoplasm of colon   . CAD (coronary artery disease) 2005   a.  Status post four-vessel CABG in 2005; b. 03/2018 MV: Small, fixed inferoapical and apical septal defect. No ischemia. EF 47% (60-65% by 05/2018 Echo).  . Chronic atrial fibrillation ([place])    a. CHADS2VASc 4 (CHF, HTN, DM, vascular disease)--> Eliquis; b. 09/2018 Zio: Chronic Afib.  . Chronic combined systolic and diastolic CHF (congestive heart failure) ([place])    a.  7/18 Echo: EF 45-50%; b. 05/2018 Echo: EF 60-65%, RVSP 74.8; c. 12/2018 Echo: EF 50-55%. Sev dil LA.  Marland Kitchen Chronic venous stasis dermatitis of both lower extremities   . Cytomegaloviral disease ([place]) 2017  . Depression   . Diabetes mellitus   . Diverticulosis   .  Dyspnea   . Erectile dysfunction   . ESRD (end stage renal disease) ([place]) 2005   a. HD 2004-2012; b. S/p renal transplant in 2012  . GERD (gastroesophageal reflux disease)   . Gout   . Hyperlipidemia    low since renal failure  . Hypertension   . Kidney transplant status, cadaveric 2012  . Obesity   . PAH (pulmonary artery hypertension) ([place])    a. 05/2018 Echo: RVSP 74.17mHg; b. 12/2018 RV not well visualized.  . Pneumonia   . [place]pura ([place]   . Sleep apnea    bipap with oxygen 2 l  . Trigger finger   . Ulcer 05/2016   Left shin  . Wears glasses     Assessment: Pharmacy consulted for heparin infusion dosing and monitoring for 59yo male admitted with Occluded cephalic vein.  Patient has PMH of A. Fib and was taking apixaban PTA. Last dose reported 0800 am 4/19.   Pt given 5500 unit bolus, followed by an initial rate of 1550 units/hr  4/20 0649 APTT 58  Goal of Therapy:  Heparin level 0.3-0.7 units/ml aPTT 66-102 seconds Monitor platelets by anticoagulation protocol: Yes   Plan:  APTT 58 is subtherapeutic.  Will rebolus 2000 units, and increase heparin infusion to 1800 units/hr  Will need to dose by aPTT levels until aptt and heparin level correlate  if initial heparin level is elevated. Check anti-Xa level/aptt in 6 hours and daily while on heparin Continue to monitor H&H and platelets  Lu Duffel, PharmD, BCPS Clinical Pharmacist 07/13/2019 7:49 AM

## 2019-07-13 NOTE — Consult Note (Signed)
NAME: [name].  DOB: [birth year]  MRN: [id number]  Date/Time: 07/13/2019 8:22 PM  REQUESTING PROVIDER: Dr.Shah Subjective:  REASON FOR CONSULT: Fever with recent covid ? [name]. is a 59 y.o. male with a history of renal transplant, on TAcrolimus and mycophenelate, CAD s/p CABG recently in Hospital between 4/7-4/11 for covid illness with pneumonia , given remdisivir and steroids presents from home with severe pain left arm which started last Friday and getting worse. Pt also has gout and on questioning has severe pain both knees and also left wrist and hand Vitals in the ED were 130/60, 98.4 and HR 72 He had ultrasound of the left arm and it showed Occluded cephalic vein at the AV fistula outflow tract in the antecubital fossa. HE has been started on heparin He then had a temp of 103 and I am asked to see him He says his breathing and cough better than before No chest pain No abdominal pain No dysuria No diarrhea  Past Medical History:  Diagnosis Date  . Allergy    seasonal  . Amnestic MCI (mild cognitive impairment with memory loss)   . Asthma   . Benign neoplasm of colon   . CAD (coronary artery disease) 2005   a.  Status post four-vessel CABG in 2005; b. 03/2018 MV: Small, fixed inferoapical and apical septal defect. No ischemia. EF 47% (60-65% by 05/2018 Echo).  . Chronic atrial fibrillation ([place])    a. CHADS2VASc 4 (CHF, HTN, DM, vascular disease)--> Eliquis; b. 09/2018 Zio: Chronic Afib.  . Chronic combined systolic and diastolic CHF (congestive heart failure) ([place])    a.  7/18 Echo: EF 45-50%; b. 05/2018 Echo: EF 60-65%, RVSP 74.8; c. 12/2018 Echo: EF 50-55%. Sev dil LA.  Marland Kitchen Chronic venous stasis dermatitis of both lower extremities   . Cytomegaloviral disease ([place]) 2017  . Depression   . Diabetes mellitus   . Diverticulosis   . Dyspnea   . Erectile dysfunction   . ESRD (end stage renal disease) ([place]) 2005   a. HD 2004-2012; b. S/p renal  transplant in 2012  . GERD (gastroesophageal reflux disease)   . Gout   . Hyperlipidemia    low since renal failure  . Hypertension   . Kidney transplant status, cadaveric 2012  . Obesity   . PAH (pulmonary artery hypertension) ([place])    a. 05/2018 Echo: RVSP 74.64mHg; b. 12/2018 RV not well visualized.  . Pneumonia   . [place]   . Sleep apnea    bipap with oxygen 2 l  . Trigger finger   . Ulcer 05/2016   Left shin  . Wears glasses     Past Surgical History:  Procedure Laterality Date  . BARIATRIC SURGERY    . CARDIAC CATHETERIZATION     [facility]  . CARDIAC CATHETERIZATION  05/03/2019  . CATARACT EXTRACTION W/PHACO Right 10/29/2017   Procedure: CATARACT EXTRACTION PHACO AND INTRAOCULAR LENS PLACEMENT ([place]  RIGHT DIABETIC;  Surgeon: BLeandrew Koyanagi MD;  [facility]  Service: Ophthalmology;  Laterality: Right;  Diabetic - insulin  . CATARACT EXTRACTION W/PHACO Left 11/18/2017   Procedure: CATARACT EXTRACTION PHACO AND INTRAOCULAR LENS PLACEMENT ([place] IV[place]/TOPICAL;  Surgeon: BLeandrew Koyanagi MD;  [facility]  Service: Ophthalmology;  Laterality: Left;  DIABETES - insulin sleep apnea  . COLONOSCOPY WITH PROPOFOL N/A 04/22/2013   Procedure: COLONOSCOPY WITH PROPOFOL;  Surgeon: DMilus Banister MD;  [facility];  Service: Endoscopy;  Laterality:  N/A;  . CORONARY ARTERY BYPASS GRAFT  2005   X 4  . DG ANGIO AV SHUNT*L*     right and left upper arms  . FASCIOTOMY  03/03/2012   Procedure: FASCIOTOMY;  Surgeon: [name], MD;  Location: [facility];  Service: Orthopedics;  Laterality: Right;  FASCIOTOMY RIGHT SMALL FINGER  . FASCIOTOMY Left 08/17/2013   Procedure: FASCIOTOMY LEFT RING;  Surgeon: [name], MD;  Location: [facility];  Service: Orthopedics;  Laterality: Left;  . INCISION AND DRAINAGE ABSCESS Left 10/15/2015   Procedure: INCISION AND DRAINAGE ABSCESS;  Surgeon: [name], MD;   Location: [facility];  Service: General;  Laterality: Left;  . KIDNEY TRANSPLANT  09/13/2010   cadaver--at [facility]  . RIGHT HEART CATH N/A 11/15/2016   Procedure: RIGHT HEART CATH;  Surgeon: [name], MD;  Location: [facility];  Service: Cardiovascular;  Laterality: N/A;  . RIGHT HEART CATH N/A 05/03/2019   Procedure: RIGHT HEART CATH;  Surgeon: [name], MD;  Location: [facility];  Service: Cardiovascular;  Laterality: N/A;  . TYMPANIC MEMBRANE REPAIR  1/12   left  . VASECTOMY      Social History   Socioeconomic History  . Marital status: Married    Spouse name: Not on file  . Number of children: 2  . Years of education: Not on file  . Highest education level: Not on file  Occupational History  . Occupation: Academic librarian: unemployed    Comment: disabled due to kidney failure  Tobacco Use  . Smoking status: Former Smoker    Types: Cigars    Quit date: 09/02/1994    Years since quitting: 24.8  . Smokeless tobacco: Never Used  Substance and Sexual Activity  . Alcohol use: Yes    Alcohol/week: 0.0 - 1.0 standard drinks    Comment: occasional- 3 in the past year  . Drug use: No  . Sexual activity: Not on file  Other Topics Concern  . Not on file  Social History Narrative   In school for culinary arts      Has living will   Wife is health care POA   Would accept resuscitation attempts   Hasn't considered tube feedings   Social Determinants of Health   Financial Resource Strain:   . Difficulty of Paying Living Expenses:   Food Insecurity:   . Worried About Charity fundraiser in the Last Year:   . Arboriculturist in the Last Year:   Transportation Needs:   . Film/video editor (Medical):   Marland Kitchen Lack of Transportation (Non-Medical):   Physical Activity:   . Days of Exercise per Week:   . Minutes of Exercise per Session:   Stress:   . Feeling of Stress :   Social Connections:   . Frequency of Communication with  Friends and Family:   . Frequency of Social Gatherings with Friends and Family:   . Attends Religious Services:   . Active Member of Clubs or Organizations:   . Attends Archivist Meetings:   Marland Kitchen Marital Status:   Intimate Partner Violence:   . Fear of Current or Ex-Partner:   . Emotionally Abused:   Marland Kitchen Physically Abused:   . Sexually Abused:     Family History  Problem Relation Age of Onset  . Heart disease Father   . Kidney failure Father   . Kidney disease  Father   . Diabetes Maternal Grandmother   . Breast cancer Maternal Grandmother   . Valvular heart disease Mother   . Liver cancer Paternal Uncle   . Liver cancer Paternal Grandmother   . Prostate cancer Neg Hx    Allergies  Allergen Reactions  . Acetaminophen Other (See Comments)    BC of transplant  . Contrast Media [Iodinated Diagnostic Agents]     Kidney transplant  . Ibuprofen Other (See Comments)    Due to kidney transplant Due to kidney transplant Due to kidney transplant    ? Current Facility-Administered Medications  Medication Dose Route Frequency Provider Last Rate Last Admin  . acetaminophen (TYLENOL) tablet 650 mg  650 mg Oral Q6H PRN Max Sane, MD   650 mg at 07/13/19 1841  . amLODipine (NORVASC) tablet 5 mg  5 mg Oral Daily Mansy, Jan A, MD   5 mg at 07/13/19 1020  . ascorbic acid (VITAMIN C) tablet 250 mg  250 mg Oral Daily Mansy, Jan A, MD   250 mg at 07/13/19 1020  . benzonatate (TESSALON) capsule 200 mg  200 mg Oral TID PRN Mansy, Jan A, MD      . budesonide (PULMICORT) nebulizer solution 0.25 mg  2 mL Inhalation BID Mansy, Jan A, MD   0.25 mg at 07/13/19 1911  . buPROPion [facility] SR) 12 hr tablet 150 mg  150 mg Oral BID Mansy, Jan A, MD   150 mg at 07/13/19 1038  . colchicine tablet 0.6 mg  0.6 mg Oral Daily PRN Mansy, Jan A, MD   0.6 mg at 07/13/19 1036  . ferrous sulfate tablet 650 mg  650 mg Oral BID Mansy, Jan A, MD   650 mg at 07/13/19 1036  . gabapentin (NEURONTIN) capsule 300  mg  300 mg Oral QHS Mansy, Jan A, MD   300 mg at 07/13/19 0047  . heparin ADULT infusion 100 units/mL (25000 units/250m sodium chloride 0.45%)  1,600 Units/hr Intravenous Continuous POswald Hillock RPH 16 mL/hr at 07/13/19 1623 1,600 Units/hr at 07/13/19 1623  . HYDROmorphone (DILAUDID) injection 0.5 mg  0.5 mg Intravenous Q2H PRN RMerlyn Lot MD   0.5 mg at 07/13/19 1747  . insulin aspart (novoLOG) injection 0-15 Units  0-15 Units Subcutaneous TID PC & HS Mansy, JArvella Merles MD   3 Units at 07/13/19 1302  . insulin glargine (LANTUS) injection 12 Units  12 Units Subcutaneous QHS Mansy, JArvella Merles MD   12 Units at 07/13/19 0048  . iohexol (OMNIPAQUE) 350 MG/ML injection 100 mL  100 mL Intravenous Once PRN RMerlyn Lot MD      . magnesium hydroxide (MILK OF MAGNESIA) suspension 30 mL  30 mL Oral Daily PRN Mansy, Jan A, MD      . metolazone (ZAROXOLYN) tablet 2.5 mg  2.5 mg Oral Daily PRN Mansy, Jan A, MD      . montelukast (SINGULAIR) tablet 10 mg  10 mg Oral QHS Mansy, Jan A, MD   10 mg at 07/13/19 0104  . multivitamin with minerals tablet 1 tablet  1 tablet Oral Daily Mansy, Jan A, MD   1 tablet at 07/13/19 1021  . mycophenolate (MYFORTIC) EC tablet 360 mg  360 mg Oral BID Mansy, Jan A, MD   360 mg at 07/13/19 1039  . ondansetron (ZOFRAN) tablet 4 mg  4 mg Oral Q6H PRN Mansy, Jan A, MD       Or  . ondansetron ([facility] injection 4 mg  4  mg Intravenous Q6H PRN Mansy, Jan A, MD      . oxyCODONE (Oxy IR/ROXICODONE) immediate release tablet 10 mg  10 mg Oral Q3H PRN Merlyn Lot, MD   10 mg at 07/13/19 1835  . pantoprazole (PROTONIX) EC tablet 40 mg  40 mg Oral Daily Mansy, Jan A, MD   40 mg at 07/13/19 1021  . rosuvastatin (CRESTOR) tablet 10 mg  10 mg Oral Daily Mansy, Jan A, MD      . tacrolimus (PROGRAF) capsule 1 mg  1 mg Oral BID Mansy, Jan A, MD   1 mg at 07/13/19 1037  . tamsulosin (FLOMAX) capsule 0.4 mg  0.4 mg Oral Daily Mansy, Jan A, MD   0.4 mg at 07/13/19 1021  . torsemide  (DEMADEX) tablet 80 mg  80 mg Oral q AM Mansy, Jan A, MD   80 mg at 07/13/19 8101   And  . torsemide (DEMADEX) tablet 40 mg  40 mg Oral QPM Mansy, Jan A, MD      . traMADol Veatrice Bourbon) tablet 50 mg  50 mg Oral TID PRN Mansy, Jan A, MD      . vancomycin (VANCOREADY) IVPB 2000 mg/400 mL  2,000 mg Intravenous Once Max Sane, MD      . vancomycin (VANCOREADY) IVPB 500 mg/100 mL  500 mg Intravenous Once Max Sane, MD      . zinc sulfate capsule 220 mg  220 mg Oral BID Mansy, Jan A, MD   220 mg at 07/13/19 1021     Abtx:  Anti-infectives (From admission, onward)   Start     Dose/Rate Route Frequency Ordered Stop   07/13/19 2030  vancomycin (VANCOREADY) IVPB 500 mg/100 mL     500 mg 100 mL/hr over 60 Minutes Intravenous  Once 07/13/19 2020     07/13/19 2030  vancomycin (VANCOREADY) IVPB 2000 mg/400 mL     2,000 mg 200 mL/hr over 120 Minutes Intravenous  Once 07/13/19 2021     07/13/19 2015  vancomycin (VANCOCIN) IVPB 1000 mg/200 mL premix  Status:  Discontinued     1,000 mg 200 mL/hr over 60 Minutes Intravenous Every 24 hours 07/13/19 2011 07/13/19 2020      REVIEW OF SYSTEMS:  Const:  fever, negative chills, negative weight loss Eyes: negative diplopia or visual changes, negative eye pain ENT: negative coryza, negative sore throat Resp: cough,nom hemoptysis, dyspnea Cards: negative for chest pain, palpitations, lower extremity edema GU: negative for frequency, dysuria and hematuria GI: Negative for abdominal pain, diarrhea, bleeding, constipation Skin: negative for rash and pruritus Heme: negative for easy bruising and gum/nose bleeding MS: as above Neurolo:negative for headaches, dizziness, vertigo, memory problems  Psych: negative for feelings of anxiety, depression  Endocrine:  diabetes Allergy/Immunology- as above: Objective:  VITALS:  BP (!) 104/54   Pulse (!) 109   Temp (!) 102.5 F (39.2 C) (Oral)   Resp 20   Ht 5' 10.5" (1.791 m)   Wt 110.7 kg   SpO2 98%   BMI  34.52 kg/m  PHYSICAL EXAM:  General: Alert, cooperative, no distress until the left arm is touched, appears stated age.  Head: Normocephalic, without obvious abnormality, atraumatic. Eyes: Conjunctivae clear, anicteric sclerae. Pupils are equal ENT Nares normal. No drainage or sinus tenderness. Lips, mucosa, and tongue normal. No Thrush Neck: Supple, symmetrical, no adenopathy, thyroid: non tender no carotid bruit and no JVD. Back: No CVA tenderness. Lungs: b/la ir entry Heart: irregular Sternal scar Abdomen: Soft,distended. Bowel sounds normal.  No masses Extremities: left wrist swollen , tender, left middle finger PIP swollen and erythematous and tender, B/l knee tenderness and warmth B/l venous stasis and hyperpigmentation Skin: No rashes or lesions. Or bruising Lymph: Cervical, supraclavicular normal. Neurologic: Grossly non-focal Pertinent Labs Lab Results CBC    Component Value Date/Time   WBC 7.9 07/13/2019 0432   RBC 4.52 07/13/2019 0432   HGB 11.3 (L) 07/13/2019 0432   HGB 13.0 06/10/2019 1019   HCT 36.6 (L) 07/13/2019 0432   HCT 41.6 06/10/2019 1019   PLT 141 (L) 07/13/2019 0432   PLT 154 06/10/2019 1019   MCV 81.0 07/13/2019 0432   MCV 78 (L) 06/10/2019 1019   MCV 83 06/16/2014 0911   MCH 25.0 (L) 07/13/2019 0432   MCHC 30.9 07/13/2019 0432   RDW 20.8 (H) 07/13/2019 0432   RDW 21.8 (H) 06/10/2019 1019   RDW 14.9 (H) 06/16/2014 0911   LYMPHSABS 0.7 07/04/2019 0555   LYMPHSABS 1.4 06/10/2019 1019   LYMPHSABS 0.5 (L) 06/16/2014 0911   MONOABS 0.2 07/04/2019 0555   MONOABS 0.5 06/16/2014 0911   EOSABS 0.0 07/04/2019 0555   EOSABS 0.0 06/10/2019 1019   EOSABS 0.1 06/16/2014 0911   BASOSABS 0.0 07/04/2019 0555   BASOSABS 0.0 06/10/2019 1019   BASOSABS 0.1 06/16/2014 0911    CMP Latest Ref Rng & Units 07/12/2019 07/04/2019 07/03/2019  Glucose 70 - 99 mg/dL 202(H) 264(H) 232(H)  BUN 6 - 20 mg/dL 23(H) 86(H) 70(H)  Creatinine 0.61 - 1.24 mg/dL 1.40(H) 1.87(H)  1.92(H)  Sodium 135 - 145 mmol/L 136 135 134(L)  Potassium 3.5 - 5.1 mmol/L 5.3(H) 4.5 4.5  Chloride 98 - 111 mmol/L 98 105 105  CO2 22 - 32 mmol/L _0 Calcium 8.9 - 10.3 mg/dL 9.2 8.7(L) 8.6(L)  Total Protein 6.5 - 8.1 g/dL - 6.3(L) 6.7  Total Bilirubin 0.3 - 1.2 mg/dL - 0.6 0.6  Alkaline Phos 38 - 126 U/L - 85 91  AST 15 - 41 U/L - 13(L) 14(L)  ALT 0 - 44 U/L - 10 10      Microbiology: [facility] sent IMAGING RESULTS:   Occluded cephalic vein at the AV fistula outflow tract in the antecubital fossa. No other thrombus identified.  I have personally reviewed the films ? Impression/Recommendation ? ?Fever- likely due to acute gout flare up Also has thrombosed cephalic vein  Gouty Polyarthritis- involving left wrist, PIP middle and 5th finger B/l knee involvement With h/o gout this looks like an acute flare up-may consider rheumatology  Doubt he has other infectious causes like gonococcus or staph Currently on vanco ( may DC tomorrow if culture neg)  Recent covid- need to be in droplet until 4/28  Covid pneumonia- 06/30/19 improving Had received remdisivir on 4/8-4/12  CAD s/p CABG  Cadaveric renal transplant- doubt fever is due to immune compromised condition- on prograf and mycophenelate ? _HTN on amlodipine_________________________________________________ Discussed with patient and his nurse Note:  This document was prepared using Dragon voice recognition software and may include unintentional dictation errors.

## 2019-07-13 NOTE — Consult Note (Signed)
ANTICOAGULATION CONSULT NOTE - Initial Consult  Pharmacy Consult for heparin infusion  Indication: VTE Treatment   Allergies  Allergen Reactions  . Acetaminophen Other (See Comments)    BC of transplant  . Contrast Media [Iodinated Diagnostic Agents]     Kidney transplant  . Ibuprofen Other (See Comments)    Due to kidney transplant Due to kidney transplant Due to kidney transplant    Patient Measurements: Height: 5' 10.5" (179.1 cm) Weight: 110.7 kg (244 lb) IBW/kg (Calculated) : 74.15 Heparin Dosing Weight: 98 kg  Vital Signs: BP: 113/67 (04/20 1600) Pulse Rate: 110 (04/20 1600)  Labs: Recent Labs    07/12/19 1230 07/12/19 1524 07/12/19 2140 07/13/19 0432 07/13/19 0649 07/13/19 1505  HGB 11.5*  --   --  11.3*  --   --   HCT 37.3*  --   --  36.6*  --   --   PLT 148*  --   --  141*  --   --   APTT  --   --  43*  --  58* >160*  LABPROT 21.0*  --   --   --   --   --   INR 1.8*  --   --   --   --   --   HEPARINUNFRC  --   --  1.71*  --   --   --   CREATININE 1.40*  --   --   --   --   --   TROPONINIHS 20* 20*  --   --   --   --     Estimated Creatinine Clearance: 71.4 mL/min (A) (by C-G formula based on SCr of 1.4 mg/dL (H)).   Medical History: Past Medical History:  Diagnosis Date  . Allergy    seasonal  . Amnestic MCI (mild cognitive impairment with memory loss)   . Asthma   . Benign neoplasm of colon   . CAD (coronary artery disease) 2005   a.  Status post four-vessel CABG in 2005; b. 03/2018 MV: Small, fixed inferoapical and apical septal defect. No ischemia. EF 47% (60-65% by 05/2018 Echo).  . Chronic atrial fibrillation ([place])    a. CHADS2VASc 4 (CHF, HTN, DM, vascular disease)--> Eliquis; b. 09/2018 [place]: Chronic Afib.  . Chronic combined systolic and diastolic CHF (congestive heart failure) ([place])    a.  7/18 Echo: EF 45-50%; b. 05/2018 Echo: EF 60-65%, RVSP 74.8; c. 12/2018 Echo: EF 50-55%. Sev dil LA.  Marland Kitchen Chronic venous stasis dermatitis of both lower  extremities   . Cytomegaloviral disease ([place]) 2017  . Depression   . Diabetes mellitus   . Diverticulosis   . Dyspnea   . Erectile dysfunction   . ESRD (end stage renal disease) ([place]) 2005   a. HD 2004-2012; b. S/p renal transplant in 2012  . GERD (gastroesophageal reflux disease)   . Gout   . Hyperlipidemia    low since renal failure  . Hypertension   . Kidney transplant status, cadaveric 2012  . Obesity   . PAH (pulmonary artery hypertension) ([place])    a. 05/2018 Echo: RVSP 74.32mHg; b. 12/2018 RV not well visualized.  . Pneumonia   . Purpura ([place]   . Sleep apnea    bipap with oxygen 2 l  . Trigger finger   . Ulcer 05/2016   Left shin  . Wears glasses     Assessment: Pharmacy consulted for heparin infusion dosing and monitoring for 59yo male admitted with Occluded  cephalic vein.  Patient has PMH of A. Fib and was taking apixaban PTA. Last dose reported 0800 am 4/19.   Pt given 5500 unit bolus, followed by an initial rate of 1550 units/hr  4/20 0649 APTT 58  Goal of Therapy:  Heparin level 0.3-0.7 units/ml aPTT 66-102 seconds Monitor platelets by anticoagulation protocol: Yes   Plan:  APTT is supratherapeutic. Will decrease heparin infusion to 1600 units/hr. Will need to dose by aPTT levels until aptt and heparin level correlate. Check anti-Xa level/aptt in 6 hours and daily while on heparin Continue to monitor H&H and platelets  Oswald Hillock, PharmD, BCPS Clinical Pharmacist 07/13/2019 4:11 PM

## 2019-07-14 ENCOUNTER — Inpatient Hospital Stay: Payer: 59

## 2019-07-14 DIAGNOSIS — I82619 Acute embolism and thrombosis of superficial veins of unspecified upper extremity: Secondary | ICD-10-CM

## 2019-07-14 DIAGNOSIS — D849 Immunodeficiency, unspecified: Secondary | ICD-10-CM

## 2019-07-14 DIAGNOSIS — T82898A Other specified complication of vascular prosthetic devices, implants and grafts, initial encounter: Principal | ICD-10-CM

## 2019-07-14 DIAGNOSIS — M129 Arthropathy, unspecified: Secondary | ICD-10-CM

## 2019-07-14 DIAGNOSIS — I10 Essential (primary) hypertension: Secondary | ICD-10-CM

## 2019-07-14 DIAGNOSIS — M064 Inflammatory polyarthropathy: Secondary | ICD-10-CM

## 2019-07-14 DIAGNOSIS — Z79899 Other long term (current) drug therapy: Secondary | ICD-10-CM

## 2019-07-14 LAB — CBC
HCT: 32.4 % — ABNORMAL LOW (ref 39.0–52.0)
Hemoglobin: 10.2 g/dL — ABNORMAL LOW (ref 13.0–17.0)
MCH: 25.1 pg — ABNORMAL LOW (ref 26.0–34.0)
MCHC: 31.5 g/dL (ref 30.0–36.0)
MCV: 79.8 fL — ABNORMAL LOW (ref 80.0–100.0)
Platelets: 139 10*3/uL — ABNORMAL LOW (ref 150–400)
RBC: 4.06 MIL/uL — ABNORMAL LOW (ref 4.22–5.81)
RDW: 20.2 % — ABNORMAL HIGH (ref 11.5–15.5)
WBC: 11.5 10*3/uL — ABNORMAL HIGH (ref 4.0–10.5)
nRBC: 0 % (ref 0.0–0.2)

## 2019-07-14 LAB — BASIC METABOLIC PANEL
Anion gap: 13 (ref 5–15)
BUN: 31 mg/dL — ABNORMAL HIGH (ref 6–20)
CO2: 30 mmol/L (ref 22–32)
Calcium: 8.4 mg/dL — ABNORMAL LOW (ref 8.9–10.3)
Chloride: 96 mmol/L — ABNORMAL LOW (ref 98–111)
Creatinine, Ser: 1.88 mg/dL — ABNORMAL HIGH (ref 0.61–1.24)
GFR calc Af Amer: 44 mL/min — ABNORMAL LOW (ref >60)
GFR calc non Af Amer: 38 mL/min — ABNORMAL LOW (ref >60)
Glucose, Bld: 124 mg/dL — ABNORMAL HIGH (ref 70–99)
Potassium: 3.7 mmol/L (ref 3.5–5.1)
Sodium: 139 mmol/L (ref 135–145)

## 2019-07-14 LAB — GLUCOSE, CAPILLARY
Glucose-Capillary: 166 mg/dL — ABNORMAL HIGH (ref 70–99)
Glucose-Capillary: 164 mg/dL — ABNORMAL HIGH (ref 70–99)
Glucose-Capillary: 183 mg/dL — ABNORMAL HIGH (ref 70–99)
Glucose-Capillary: 249 mg/dL — ABNORMAL HIGH (ref 70–99)

## 2019-07-14 MED ORDER — ORAL CARE MOUTH RINSE
15.0000 mL | Freq: Two times a day (BID) | OROMUCOSAL | Status: DC
  Administered 2019-07-14 – 2019-07-18 (×8): 15 mL via OROMUCOSAL

## 2019-07-14 MED ORDER — BUDESONIDE 180 MCG/ACT IN AEPB
2.0000 | INHALATION_SPRAY | Freq: Two times a day (BID) | RESPIRATORY_TRACT | Status: DC
  Administered 2019-07-14 – 2019-07-18 (×9): 2 via RESPIRATORY_TRACT
  Filled 2019-07-14 (×2): qty 1

## 2019-07-14 MED ORDER — METHYLPREDNISOLONE SODIUM SUCC 40 MG IJ SOLR
40.0000 mg | Freq: Three times a day (TID) | INTRAMUSCULAR | Status: DC
  Administered 2019-07-14 – 2019-07-15 (×4): 40 mg via INTRAVENOUS
  Filled 2019-07-14 (×4): qty 1

## 2019-07-14 NOTE — Progress Notes (Signed)
ID Pt has no fever Pt is upset and feels unsafe and says his requests are not being met Informed his nurse who visited him   Patient Vitals for the past 24 hrs:  BP Temp Temp src Pulse Resp SpO2 Weight  07/14/19 1726 (!) 144/75 98.4 F (36.9 C) Oral 87 18 97 % --  07/14/19 1634 110/68 98.4 F (36.9 C) Oral 92 18 97 % --  07/14/19 1201 105/60 98.6 F (37 C) Oral 87 18 100 % --  07/14/19 1048 -- -- -- -- -- 97 % --  07/14/19 0759 (!) 101/58 98.4 F (36.9 C) Oral 96 18 97 % --  07/14/19 0738 -- -- -- -- (!) 22 -- --  07/14/19 0737 -- -- -- -- 17 -- --  07/14/19 0736 -- -- -- -- (!) 22 -- --  07/14/19 0735 -- -- -- -- (!) 21 -- --  07/14/19 0734 -- -- -- -- (!) 22 -- --  07/14/19 0733 -- -- -- -- 18 -- --  07/14/19 0732 -- -- -- -- 18 -- --  07/14/19 0731 -- -- -- -- (!) 21 -- --  07/14/19 0730 -- -- -- -- 13 -- --  07/14/19 0729 -- -- -- -- 20 -- --  07/14/19 0728 -- -- -- -- 16 -- --  07/14/19 0727 -- -- -- -- 18 -- --  07/14/19 0726 -- -- -- -- (!) 22 -- --  07/14/19 0725 -- -- -- -- 18 -- --  07/14/19 0724 -- -- -- -- (!) 21 -- --  07/14/19 0723 -- -- -- -- 19 -- --  07/14/19 0722 -- -- -- -- 19 -- --  07/14/19 0721 -- -- -- -- 20 -- --  07/14/19 0720 -- -- -- -- 20 -- --  07/14/19 0719 -- -- -- -- 20 -- --  07/14/19 0718 -- -- -- -- 20 -- --  07/14/19 0717 -- -- -- -- 20 -- --  07/14/19 0716 -- -- -- -- (!) 21 -- --  07/14/19 0715 -- -- -- -- 20 -- --  07/14/19 0714 -- -- -- -- (!) 21 -- --  07/14/19 0713 -- -- -- -- (!) 21 -- --  07/14/19 0712 -- -- -- -- 15 -- --  07/14/19 0711 -- -- -- -- 20 -- --  07/14/19 0710 -- -- -- -- 19 -- --  07/14/19 0709 -- -- -- -- 18 -- --  07/14/19 0708 -- -- -- -- 12 -- --  07/14/19 0707 -- -- -- -- 12 -- --  07/14/19 0706 -- -- -- -- 16 -- --  07/14/19 0705 -- -- -- -- 14 -- --  07/14/19 0704 -- -- -- -- 14 -- --  07/14/19 0703 -- -- -- -- 14 -- --  07/14/19 0702 -- -- -- -- 16 -- --  07/14/19 0701 -- -- -- -- 18 -- --   07/14/19 0700 -- -- -- -- 15 -- --  07/14/19 0659 -- -- -- -- 11 -- --  07/14/19 0658 -- -- -- -- 12 -- --  07/14/19 0657 -- -- -- -- 15 -- --  07/14/19 0656 -- -- -- -- (!) 21 -- --  07/14/19 0655 -- -- -- -- 16 -- --  07/14/19 0654 -- -- -- -- 19 -- --  07/14/19 0653 -- -- -- -- 17 -- --  07/14/19 0652 -- -- -- -- 16 -- --  07/14/19 0651 -- -- -- --  13 -- --  07/14/19 0650 -- -- -- -- 17 -- --  07/14/19 0649 -- -- -- -- (!) 24 -- --  07/14/19 0648 -- -- -- -- 17 -- --  07/14/19 0647 -- -- -- -- (!) 21 -- --  07/14/19 0646 -- -- -- -- 16 -- --  07/14/19 0645 -- -- -- -- 17 -- --  07/14/19 0644 -- -- -- -- 19 -- --  07/14/19 0643 -- -- -- -- 17 -- --  07/14/19 0642 -- -- -- -- 17 -- --  07/14/19 0641 -- -- -- -- 18 -- --  07/14/19 0640 -- -- -- -- 18 -- --  07/14/19 0639 -- -- -- -- 12 -- --  07/14/19 0638 -- -- -- -- 13 -- --  07/14/19 0637 -- -- -- -- 20 -- --  07/14/19 0636 -- -- -- -- 17 -- --  07/14/19 0635 -- -- -- -- 15 -- --  07/14/19 0634 -- -- -- -- (!) 23 -- --  07/14/19 0633 -- -- -- -- 13 -- --  07/14/19 0632 -- -- -- -- 14 -- --  07/14/19 0631 -- -- -- -- 17 -- --  07/14/19 0630 -- -- -- -- 17 -- --  07/14/19 0629 -- -- -- -- (!) 24 -- --  07/14/19 0628 -- -- -- -- (!) 22 -- --  07/14/19 0627 -- -- -- -- (!) 21 -- --  07/14/19 0626 -- -- -- -- (!) 22 -- --  07/14/19 0625 -- -- -- -- (!) 24 -- --  07/14/19 0624 -- -- -- -- (!) 22 -- --  07/14/19 0623 -- -- -- -- (!) 22 -- --  07/14/19 0622 -- -- -- -- (!) 23 -- --  07/14/19 0621 -- -- -- -- (!) 22 -- --  07/14/19 0620 -- -- -- -- (!) 23 -- --  07/14/19 0619 -- -- -- -- (!) 23 -- --  07/14/19 0618 -- -- -- -- (!) 21 -- --  07/14/19 0617 -- -- -- -- (!) 22 -- --  07/14/19 0616 -- -- -- -- (!) 24 -- --  07/14/19 0615 -- -- -- -- (!) 23 -- --  07/14/19 0614 -- -- -- -- (!) 23 -- --  07/14/19 0613 -- -- -- -- (!) 23 -- --  07/14/19 0612 -- -- -- -- (!) 22 -- --  07/14/19 0611 -- -- -- -- (!) 22 -- --   07/14/19 0610 -- -- -- -- (!) 23 -- --  07/14/19 0609 -- -- -- -- (!) 23 -- --  07/14/19 0608 -- -- -- -- (!) 23 -- --  07/14/19 0607 -- -- -- -- (!) 23 -- --  07/14/19 0606 -- -- -- -- (!) 23 -- --  07/14/19 0605 -- -- -- -- (!) 22 -- --  07/14/19 0604 -- -- -- -- (!) 22 -- --  07/14/19 0603 -- -- -- -- (!) 24 -- --  07/14/19 0602 -- -- -- -- (!) 23 -- --  07/14/19 0601 -- -- -- -- 20 -- --  07/14/19 0600 -- -- -- -- (!) 23 -- --  07/14/19 0502 (!) 104/55 98.8 F (37.1 C) Oral 86 -- 98 % 111.9 kg  07/14/19 0025 (!) 111/59 98.5 F (36.9 C) Oral 81 (!) 24 97 % --  07/13/19 2015 (!) 104/54 99.1 F (37.3 C) Oral (!) 109 -- 98 % --   On examination afebrile Awake  and alert No distress Left wrist tenderness, left middle finger and bilateral knee tenderness improved Rt knee>left knee  Labs Blood culture negative so far CBC    Component Value Date/Time   WBC 11.5 (H) 07/14/2019 0424   RBC 4.06 (L) 07/14/2019 0424   HGB 10.2 (L) 07/14/2019 0424   HGB 13.0 06/10/2019 1019   HCT 32.4 (L) 07/14/2019 0424   HCT 41.6 06/10/2019 1019   PLT 139 (L) 07/14/2019 0424   PLT 154 06/10/2019 1019   MCV 79.8 (L) 07/14/2019 0424   MCV 78 (L) 06/10/2019 1019   MCV 83 06/16/2014 0911   MCH 25.1 (L) 07/14/2019 0424   MCHC 31.5 07/14/2019 0424   RDW 20.2 (H) 07/14/2019 0424   RDW 21.8 (H) 06/10/2019 1019   RDW 14.9 (H) 06/16/2014 0911   LYMPHSABS 0.7 07/04/2019 0555   LYMPHSABS 1.4 06/10/2019 1019   LYMPHSABS 0.5 (L) 06/16/2014 0911   MONOABS 0.2 07/04/2019 0555   MONOABS 0.5 06/16/2014 0911   EOSABS 0.0 07/04/2019 0555   EOSABS 0.0 06/10/2019 1019   EOSABS 0.1 06/16/2014 0911   BASOSABS 0.0 07/04/2019 0555   BASOSABS 0.0 06/10/2019 1019   BASOSABS 0.1 06/16/2014 0911     CMP Latest Ref Rng & Units 07/14/2019 07/12/2019 07/04/2019  Glucose 70 - 99 mg/dL 124(H) 202(H) 264(H)  BUN 6 - 20 mg/dL 31(H) 23(H) 86(H)  Creatinine 0.61 - 1.24 mg/dL 1.88(H) 1.40(H) 1.87(H)  Sodium 135 - 145  mmol/L 139 136 135  Potassium 3.5 - 5.1 mmol/L 3.7 5.3(H) 4.5  Chloride 98 - 111 mmol/L 96(L) 98 105  CO2 22 - 32 mmol/L _0 Calcium 8.9 - 10.3 mg/dL 8.4(L) 9.2 8.7(L)  Total Protein 6.5 - 8.1 g/dL - - 6.3(L)  Total Bilirubin 0.3 - 1.2 mg/dL - - 0.6  Alkaline Phos 38 - 126 U/L - - 85  AST 15 - 41 U/L - - 13(L)  ALT 0 - 44 U/L - - 10    Impression and recommendation  Fever like due to inflammatory arthritis  Has an occluded cephalic vein but I doubt that is acute  Inflammatory Polyarthritis: Involving left wrist, left middle PIP and fifth finger.  Bilateral knee involvement.  Rheumatoid arthritis ( recent rheumatoid factor from 06/11/19 positive at 23)  VS gout. Looks more like the former Watch for any infectious cause like gonococcus or Staphylococcus- though less likely .  As culture is negative  will discontinue vancomycin.  He is on Solu-Medrol as per primary team  Recent Covid with b/l pneumonia Droplet precaution till 4/28 as he is still needing oxygen  History of coronary artery disease status post CABG  Cadaveric renal transplant on Prograf and mycophenolate  Hypertension amlodipine  Discussed with the patient ID will sign off call if needed.

## 2019-07-14 NOTE — Progress Notes (Signed)
PROGRESS NOTE    [name].  JME:[id number] DOB: 1960-10-01 DOA: 07/12/2019 PCP: [name], MD    Brief Narrative:  Briefly, patient is a 59 year old man with multiple medical problems including status post renal transplantation in 2012 presently on Prograf, HFpEF, atrial fibrillation on Eliquis, CAD status post CABG in 2005 denies MI and pulmonary artery hypertension who was admitted 06/30/2019-07/04/2019 for COVID pneumonia. Patient had received first Covid vaccine 5 weeks previously, he had lost his mother to Covid earlier this year. Now readmitted for acute onset of left arm anterior chest wall pain.   While in the emergency room, Left upper extremity venous Doppler revealed occluded cephalic vein at the AV fistula outflow tract in the antecubital fossa with no other thrombus identified. Chest x-ray showed Central pulmonary vascular congestion without definite pulmonary edema or consolidative process.  4/21: Still with severe pain in the left arm and decreased range of motion.  Also with pain and decreased range of motion of bilateral ankles and bilateral knees.  Not responding to narcotic therapy yesterday.   Assessment & Plan:   Active Problems:   AV fistula occlusion (HCC)  Polyarticular arthropathy History of RA History of gout Patient's multijoint involvement points to potential rheumatologic cause Patient was very tender to any movement or palpation pain was unresponsive to narcotic therapy Potentially do to polyarticular gout versus RA flare Plan: Pain control Imaging survey of the affected joints Solu-Medrol 40 mg every 8 hours I have requested consultation with rheumatology.  Message sent to Dr. Jefm Bryant.  Recommendations appreciated.  Left AV fistula occlusion with cephalic vein thrombosis on Eliquis with acute respiratory failurewith hypoxia of unclear etiology, rule out acute pulmonary embolism. -swtich IV heparin drip back to Eliquis as no  vascular intervention planned -Pain management  -Vascular surgery input appreciated - no intervention planned as patient doesn't use LUE dialysis access and to avoid further kidney injury considering transplant status -O2 protocol will be followed. -VQ scan negative for PE Continue home p.o. diuretics  Gout. -continue allopurinol. - See above management of polyarticular arthropathy  Type 2 diabetes mellitus. -SSI and lantus for now. DM nurse c/s  Dyslipidemia. -continue statin therapy.  BPH. -continue Flomax.  Heart failure reduced ejection fraction - EF 45-50% Patient follows his dry weight very carefully and knows how to tailor his diuretics to weight. Follows with [facility] cardio. Will consider c/s if need  Status post renal transplant/CKD 3A Continue Prograf/tacrolimus continue present meds Creatinine is 1.8 today  Will involve nephrology if creatinine rises  Permanent Atrial fibrillation Patient does not appear to be on any rate control medications per med rec however heart rate is low normal. Continue Eliquis  CAD Patient has remote history of CABG, denies history of MI in past Patient does not appear to be on a beta-blocker on admission, possibly secondary to baseline bradycardia I do not see any antiplatelet agents on med rec, this can be addressed as an outpatient as warranted.  Depression Continue Wellbutrin  Hypertension. -continue Norvasc.   DVT prophylaxis: Eliquis Code Status: Full Family Communication: Wife Mateo Flow [phone] on 4/21 Disposition Plan: Status is: Inpatient  Remains inpatient appropriate because:Inpatient level of care appropriate due to severity of illness   Dispo: The patient is from: Home              Anticipated d/c is to: Home              Anticipated d/c date is: 2 days  Patient currently is not medically stable to d/c.         Consultants:   ID  Vascular sx  Rheumatology  Procedures:    none  Antimicrobials:   none   Subjective: Seen and examined Still with joint pain No fevers noted All cultures negative  Objective: Vitals:   07/14/19 0738 07/14/19 0759 07/14/19 1201 07/14/19 1634  BP:  (!) 101/58 105/60 110/68  Pulse:  96 87 92  Resp: (!) _0 Temp:  98.4 F (36.9 C) 98.6 F (37 C) 98.4 F (36.9 C)  TempSrc:  Oral Oral Oral  SpO2:  97% 100% 97%  Weight:      Height:        Intake/Output Summary (Last 24 hours) at 07/14/2019 1656 Last data filed at 07/14/2019 1636 Gross per 24 hour  Intake 220 ml  Output 725 ml  Net -505 ml   Filed Weights   07/12/19 1229 07/14/19 0502  Weight: 110.7 kg 111.9 kg    Examination:  General exam: Appears calm and comfortable  Respiratory system: Clear to auscultation. Respiratory effort normal. Cardiovascular system: S1 & S2 heard, RRR. No JVD, murmurs, rubs, gallops or clicks. No pedal edema. Gastrointestinal system: Abdomen is nondistended, soft and nontender. No organomegaly or masses felt. Normal bowel sounds heard. Central nervous system: Alert and oriented. No focal neurological deficits. Extremities: Left upper extremity and bilateral lower extremity tender to touch, decreased range of motion, pain on exertion Skin: No rashes, lesions or ulcers Psychiatry: Judgement and insight appear normal. Mood & affect appropriate.     Data Reviewed: I have personally reviewed following labs and imaging studies  CBC: Recent Labs  Lab 07/12/19 1230 07/13/19 0432 07/14/19 0424  WBC 8.5 7.9 11.5*  HGB 11.5* 11.3* 10.2*  HCT 37.3* 36.6* 32.4*  MCV 80.7 81.0 79.8*  PLT 148* 141* 496*   Basic Metabolic Panel: Recent Labs  Lab 07/12/19 1230 07/14/19 0424  NA 136 139  K 5.3* 3.7  CL 98 96*  CO2 27 30  GLUCOSE 202* 124*  BUN 23* 31*  CREATININE 1.40* 1.88*  CALCIUM 9.2 8.4*   GFR: Estimated Creatinine Clearance: 53.4 mL/min (A) (by C-G formula based on SCr of 1.88 mg/dL (H)). Liver Function  Tests: No results for input(s): AST, ALT, ALKPHOS, BILITOT, PROT, ALBUMIN in the last 168 hours. No results for input(s): LIPASE, AMYLASE in the last 168 hours. No results for input(s): AMMONIA in the last 168 hours. Coagulation Profile: Recent Labs  Lab 07/12/19 1230  INR 1.8*   Cardiac Enzymes: No results for input(s): CKTOTAL, CKMB, CKMBINDEX, TROPONINI in the last 168 hours. BNP (last 3 results) No results for input(s): PROBNP in the last 8760 hours. HbA1C: No results for input(s): HGBA1C in the last 72 hours. CBG: Recent Labs  Lab 07/13/19 1257 07/13/19 2054 07/14/19 0759 07/14/19 1202 07/14/19 1635  GLUCAP 170* 117* 166* 164* 183*   Lipid Profile: No results for input(s): CHOL, HDL, LDLCALC, TRIG, CHOLHDL, LDLDIRECT in the last 72 hours. Thyroid Function Tests: No results for input(s): TSH, T4TOTAL, FREET4, T3FREE, THYROIDAB in the last 72 hours. Anemia Panel: No results for input(s): VITAMINB12, FOLATE, FERRITIN, TIBC, IRON, RETICCTPCT in the last 72 hours. Sepsis Labs: No results for input(s): PROCALCITON, LATICACIDVEN in the last 168 hours.  Recent Results (from the past 240 hour(s))  CULTURE, BLOOD (ROUTINE X 2) w Reflex to ID Panel     Status: None (Preliminary result)   Collection Time: 07/13/19  7:07 PM   Specimen: BLOOD  Result Value Ref Range Status   Specimen Description BLOOD BLOOD RIGHT HAND  Final   Special Requests   Final    BOTTLES DRAWN AEROBIC AND ANAEROBIC Blood Culture adequate volume   Culture   Final    NO GROWTH < 12 HOURS Performed at [facility], [address], [place], [place] [address]    Report Status PENDING  Incomplete  CULTURE, BLOOD (ROUTINE X 2) w Reflex to ID Panel     Status: None (Preliminary result)   Collection Time: 07/13/19  7:08 PM   Specimen: BLOOD  Result Value Ref Range Status   Specimen Description BLOOD RIGHT ANTECUBITAL  Final   Special Requests   Final    BOTTLES DRAWN AEROBIC AND ANAEROBIC Blood  Culture adequate volume   Culture   Final    NO GROWTH < 12 HOURS Performed at [facility], [address] [place], [place] [address]    Report Status PENDING  Incomplete         Radiology Studies: DG Elbow 2 Views Left  Result Date: 07/14/2019 CLINICAL DATA:  Rheumatoid arthritis, polyarthralgia, no known injury EXAM: LEFT ELBOW - 2 VIEW COMPARISON:  None. FINDINGS: There is no evidence of fracture, dislocation, or joint effusion. There is no evidence of arthropathy or other focal bone abnormality. Vascular calcinosis and surgical clips. IMPRESSION: No fracture or dislocation of the left elbow. Joint spaces are preserved. No elbow joint effusion. Electronically Signed   By: Eddie Candle M.D.   On: 07/14/2019 12:45   DG Wrist 2 Views Left  Result Date: 07/14/2019 CLINICAL DATA:  Rheumatoid arthritis, polyarthralgia, no known injury EXAM: LEFT WRIST - 2 VIEW COMPARISON:  None. FINDINGS: There is no evidence of fracture or dislocation. There is no evidence of arthropathy or other focal bone abnormality. Surgical clips about the radial wrist. IMPRESSION: No fracture or dislocation of the left wrist. The carpus is normally aligned. Joint spaces are preserved. Electronically Signed   By: Eddie Candle M.D.   On: 07/14/2019 12:44   DG Knee 1-2 Views Left  Result Date: 07/14/2019 CLINICAL DATA:  Rheumatoid arthritis, polyarthralgia, no known injury EXAM: LEFT KNEE - 1-2 VIEW COMPARISON:  None. FINDINGS: No evidence of fracture, dislocation, or joint effusion. No evidence of arthropathy or other focal bone abnormality. Vascular calcinosis. IMPRESSION: No fracture or dislocation of the left knee. Joint spaces are preserved. Electronically Signed   By: Eddie Candle M.D.   On: 07/14/2019 12:51   DG Knee 1-2 Views Right  Result Date: 07/14/2019 CLINICAL DATA:  Rheumatoid arthritis, polyarthralgia, no known injury EXAM: RIGHT KNEE - 1-2 VIEW COMPARISON:  None. FINDINGS: No evidence of  fracture or dislocation. Probable knee joint effusion. No evidence of arthropathy or other focal bone abnormality. Vascular calcinosis. Surgical clips about the medial knee. IMPRESSION: 1. No fracture or dislocation of the right knee. Joint spaces are preserved. 2.  Probable nonspecific knee joint effusion. Electronically Signed   By: Eddie Candle M.D.   On: 07/14/2019 12:50   DG Ankle 2 Views Left  Result Date: 07/14/2019 CLINICAL DATA:  Rheumatoid arthritis, polyarthralgia, no known injury EXAM: LEFT ANKLE - 2 VIEW COMPARISON:  None. FINDINGS: There is no evidence of fracture, dislocation, or joint effusion. There is no evidence of arthropathy or other focal bone abnormality. Vascular calcinosis. IMPRESSION: No fracture or dislocation of the left ankle. Joint spaces are preserved. Electronically Signed   By: Eddie Candle  M.D.   On: 07/14/2019 12:48   DG Ankle 2 Views Right  Result Date: 07/14/2019 CLINICAL DATA:  Rheumatoid arthritis, polyarthralgia, no known injury EXAM: RIGHT ANKLE - 2 VIEW COMPARISON:  None. FINDINGS: No fracture or dislocation of the right ankle. There may be periarticular erosions and subchondral cystic change about the ankle mortise and tibiofibular articulation, not ideally evaluated although most conspicuous on lateral view. Vascular calcinosis. IMPRESSION: 1. No fracture or dislocation of the right ankle. 2. There may be periarticular erosions and subchondral cystic change about the ankle mortise and tibiofibular articulation, not ideally evaluated although most conspicuous on lateral view. MRI may be used to further evaluate for inflammatory synovitis/periarthritis and degenerative change if desired. Electronically Signed   By: Eddie Candle M.D.   On: 07/14/2019 12:47   NM Pulmonary Perfusion  Result Date: 07/13/2019 CLINICAL DATA:  Hypoxemia EXAM: NUCLEAR MEDICINE PERFUSION LUNG SCAN TECHNIQUE: Perfusion images were obtained in multiple projections after intravenous  injection of radiopharmaceutical. Ventilation scans intentionally deferred if perfusion scan and chest x-ray adequate for interpretation during COVID 19 epidemic. RADIOPHARMACEUTICALS:  4.1 mCi Tc-47mMAA IV COMPARISON:  Chest x-ray 07/12/2019 FINDINGS: No perfusion defects seen to suggest pulmonary embolus. IMPRESSION: No evidence of pulmonary embolus. Electronically Signed   By: KRolm BaptiseM.D.   On: 07/13/2019 22:12   UKoreaVenous Img Upper Uni Left  Result Date: 07/12/2019 CLINICAL DATA:  59year old male with left arm pain. Evaluate for DVT. EXAM: Left UPPER EXTREMITY VENOUS DOPPLER ULTRASOUND TECHNIQUE: Gray-scale sonography with graded compression, as well as color Doppler and duplex ultrasound were performed to evaluate the upper extremity deep venous system from the level of the subclavian vein and including the jugular, axillary, basilic, radial, ulnar and upper cephalic vein. Spectral Doppler was utilized to evaluate flow at rest and with distal augmentation maneuvers. COMPARISON:  None. FINDINGS: Contralateral Subclavian Vein: Respiratory phasicity is normal and symmetric with the symptomatic side. No evidence of thrombus. Normal compressibility. Internal Jugular Vein: No evidence of thrombus. Normal compressibility, respiratory phasicity and response to augmentation. Subclavian Vein: No evidence of thrombus. Normal compressibility, respiratory phasicity and response to augmentation. Axillary Vein: No evidence of thrombus. Normal compressibility, respiratory phasicity and response to augmentation. Cephalic Vein: There is occlusive thrombus in the distal cephalic vein at the antecubital fossa at the site of fistula anastomosis. Basilic Vein: No evidence of thrombus. Normal compressibility, respiratory phasicity and response to augmentation. Brachial Veins: No evidence of thrombus. Normal compressibility, respiratory phasicity and response to augmentation. Radial Veins: No evidence of thrombus. Normal  compressibility, respiratory phasicity and response to augmentation. Ulnar Veins: No evidence of thrombus. Normal compressibility, respiratory phasicity and response to augmentation. Venous Reflux:  None visualized. Other Findings:  None visualized. IMPRESSION: Occluded cephalic vein at the AV fistula outflow tract in the antecubital fossa. No other thrombus identified. These results were called by telephone at the time of interpretation on 07/12/2019 at 8:12 pm to provider PMerlyn Lot, who verbally acknowledged these results. Electronically Signed   By: AAnner CreteM.D.   On: 07/12/2019 20:30   DG Shoulder Left  Result Date: 07/14/2019 CLINICAL DATA:  Rheumatoid arthritis, polyarthritis, no known injury, COVID EXAM: LEFT SHOULDER - 2+ VIEW COMPARISON:  None. FINDINGS: There is no evidence of fracture or dislocation. There is no evidence of arthropathy or other focal bone abnormality. Soft tissues are unremarkable. IMPRESSION: No fracture or dislocation of the left shoulder. Joint spaces are preserved. Electronically Signed   By: AEddie Candle  M.D.   On: 07/14/2019 12:43        Scheduled Meds: . amLODipine  5 mg Oral Daily  . apixaban  5 mg Oral BID  . vitamin C  250 mg Oral Daily  . budesonide  2 puff Inhalation BID  . buPROPion  150 mg Oral BID  . ferrous sulfate  650 mg Oral BID  . gabapentin  300 mg Oral QHS  . insulin aspart  0-15 Units Subcutaneous TID PC & HS  . insulin glargine  12 Units Subcutaneous QHS  . mouth rinse  15 mL Mouth Rinse BID  . methylPREDNISolone (SOLU-MEDROL) injection  40 mg Intravenous Q8H  . montelukast  10 mg Oral QHS  . multivitamin with minerals  1 tablet Oral Daily  . mycophenolate  360 mg Oral BID  . pantoprazole  40 mg Oral Daily  . rosuvastatin  10 mg Oral Daily  . tacrolimus  1 mg Oral BID  . tamsulosin  0.4 mg Oral Daily  . torsemide  80 mg Oral q AM   And  . torsemide  40 mg Oral QPM  . zinc sulfate  220 mg Oral BID   Continuous  Infusions: . sodium chloride Stopped (07/14/19 0429)  . vancomycin       LOS: 2 days    Time spent: 35 min    Sidney Ace, MD [facility] Pager [phone]  If 7PM-7AM, please contact night-coverage 07/14/2019, 4:56 PM

## 2019-07-14 NOTE — Progress Notes (Signed)
Pt provided with milk and graham crackers

## 2019-07-14 NOTE — Progress Notes (Signed)
Report given to 1C RN, Will transport patient to room 101.

## 2019-07-14 NOTE — Progress Notes (Signed)
Patient complained of pain during the night; was given Dilaudid 0.5 mg as well as Oxycodone 10 mg x 2. Patient was unable to tolerate OOB due to pain in his knees and in his left chest and left arm. He was unable to bear even gentle touch to his body, screams out. He sat at the side of bed greater than 20 minutes attempting OOB to [facility] before he gave up and needed assistance of 2 persons to help him back into bed and pulled up. Pt advised should not attempt OOB until pain is better controlled. Pt verbalized agreement.

## 2019-07-15 ENCOUNTER — Ambulatory Visit: Payer: Self-pay | Admitting: *Deleted

## 2019-07-15 ENCOUNTER — Other Ambulatory Visit: Payer: Self-pay | Admitting: *Deleted

## 2019-07-15 LAB — CREATININE, SERUM
Creatinine, Ser: 1.89 mg/dL — ABNORMAL HIGH (ref 0.61–1.24)
GFR calc Af Amer: 44 mL/min — ABNORMAL LOW (ref >60)
GFR calc non Af Amer: 38 mL/min — ABNORMAL LOW (ref >60)

## 2019-07-15 LAB — GLUCOSE, CAPILLARY
Glucose-Capillary: 318 mg/dL — ABNORMAL HIGH (ref 70–99)
Glucose-Capillary: 322 mg/dL — ABNORMAL HIGH (ref 70–99)
Glucose-Capillary: 376 mg/dL — ABNORMAL HIGH (ref 70–99)
Glucose-Capillary: 382 mg/dL — ABNORMAL HIGH (ref 70–99)
Glucose-Capillary: 414 mg/dL — ABNORMAL HIGH (ref 70–99)
Glucose-Capillary: 419 mg/dL — ABNORMAL HIGH (ref 70–99)
Glucose-Capillary: 430 mg/dL — ABNORMAL HIGH (ref 70–99)

## 2019-07-15 LAB — SYNOVIAL CELL COUNT + DIFF, W/ CRYSTALS
Eosinophils-Synovial: 0 %
Lymphocytes-Synovial Fld: 1 %
Monocyte-Macrophage-Synovial Fluid: 3 %
Neutrophil, Synovial: 96 %
Other Cells-SYN: 0
WBC, Synovial: 83182 /mm3 — ABNORMAL HIGH (ref 0–200)

## 2019-07-15 LAB — CBC WITH DIFFERENTIAL/PLATELET
Abs Immature Granulocytes: 0.04 10*3/uL (ref 0.00–0.07)
Basophils Absolute: 0 10*3/uL (ref 0.0–0.1)
Basophils Relative: 0 %
Eosinophils Absolute: 0 10*3/uL (ref 0.0–0.5)
Eosinophils Relative: 0 %
HCT: 35.2 % — ABNORMAL LOW (ref 39.0–52.0)
Hemoglobin: 11 g/dL — ABNORMAL LOW (ref 13.0–17.0)
Immature Granulocytes: 1 %
Lymphocytes Relative: 7 %
Lymphs Abs: 0.6 10*3/uL — ABNORMAL LOW (ref 0.7–4.0)
MCH: 25.1 pg — ABNORMAL LOW (ref 26.0–34.0)
MCHC: 31.3 g/dL (ref 30.0–36.0)
MCV: 80.4 fL (ref 80.0–100.0)
Monocytes Absolute: 0.2 10*3/uL (ref 0.1–1.0)
Monocytes Relative: 3 %
Neutro Abs: 6.6 10*3/uL (ref 1.7–7.7)
Neutrophils Relative %: 89 %
Platelets: 152 10*3/uL (ref 150–400)
RBC: 4.38 MIL/uL (ref 4.22–5.81)
RDW: 19.1 % — ABNORMAL HIGH (ref 11.5–15.5)
WBC: 7.5 10*3/uL (ref 4.0–10.5)
nRBC: 0 % (ref 0.0–0.2)

## 2019-07-15 LAB — BASIC METABOLIC PANEL
Anion gap: 11 (ref 5–15)
BUN: 55 mg/dL — ABNORMAL HIGH (ref 6–20)
CO2: 30 mmol/L (ref 22–32)
Calcium: 8.8 mg/dL — ABNORMAL LOW (ref 8.9–10.3)
Chloride: 94 mmol/L — ABNORMAL LOW (ref 98–111)
Creatinine, Ser: 1.98 mg/dL — ABNORMAL HIGH (ref 0.61–1.24)
GFR calc Af Amer: 42 mL/min — ABNORMAL LOW (ref >60)
GFR calc non Af Amer: 36 mL/min — ABNORMAL LOW (ref >60)
Glucose, Bld: 358 mg/dL — ABNORMAL HIGH (ref 70–99)
Potassium: 3.9 mmol/L (ref 3.5–5.1)
Sodium: 135 mmol/L (ref 135–145)

## 2019-07-15 LAB — SEDIMENTATION RATE: Sed Rate: 117 mm/hr — ABNORMAL HIGH (ref 0–20)

## 2019-07-15 LAB — C-REACTIVE PROTEIN: CRP: 28.1 mg/dL — ABNORMAL HIGH (ref <1.0)

## 2019-07-15 MED ORDER — METHYLPREDNISOLONE SODIUM SUCC 40 MG IJ SOLR
20.0000 mg | Freq: Three times a day (TID) | INTRAMUSCULAR | Status: DC
  Administered 2019-07-15 – 2019-07-16 (×2): 20 mg via INTRAVENOUS
  Filled 2019-07-15 (×2): qty 1

## 2019-07-15 MED ORDER — INSULIN ASPART 100 UNIT/ML ~~LOC~~ SOLN
0.0000 [IU] | SUBCUTANEOUS | Status: DC
  Administered 2019-07-15 – 2019-07-16 (×2): 20 [IU] via SUBCUTANEOUS
  Administered 2019-07-16: 15 [IU] via SUBCUTANEOUS
  Administered 2019-07-16: 7 [IU] via SUBCUTANEOUS
  Filled 2019-07-15 (×3): qty 1

## 2019-07-15 MED ORDER — TRIAMCINOLONE ACETONIDE 40 MG/ML IJ SUSP
40.0000 mg | Freq: Once | INTRAMUSCULAR | Status: AC
  Administered 2019-07-15: 40 mg via INTRAMUSCULAR
  Filled 2019-07-15: qty 1

## 2019-07-15 MED ORDER — ALLOPURINOL 100 MG PO TABS
50.0000 mg | ORAL_TABLET | Freq: Every day | ORAL | Status: DC
  Administered 2019-07-16 – 2019-07-18 (×3): 50 mg via ORAL
  Filled 2019-07-15 (×3): qty 1

## 2019-07-15 MED ORDER — INSULIN ASPART 100 UNIT/ML ~~LOC~~ SOLN
15.0000 [IU] | Freq: Once | SUBCUTANEOUS | Status: AC
  Administered 2019-07-15: 15 [IU] via SUBCUTANEOUS
  Filled 2019-07-15: qty 1

## 2019-07-15 NOTE — Consult Note (Signed)
Reason for Consult: Joint pain  Referring Physician: Hospitalist  [name].   HPI: 59 year old white male.  Complicated story.  Renal transplant 8 years ago for hypertension and diabetes.  He has been on tacrolimus and mycophenolate Developed worsening renal insufficiency.  Had biopsy.  Thought to be related to diabetes.  Cardiomyopathy.  Status post bypass.  Last ejection fraction 45 to 50%.  History of arthritis of the ankle.  Thought to be gouty.  About 2 years ago.  Would get it intermittently hand elbow ankle and foot.  Would take colchicine temporarily  3 months ago he had more shortness of breath admitted for congestive heart failure.  After that developed worsening joint pain.  Left hand.  Right knee.  Had aspiration with monosodium urate crystals.  Some of the left hand.  3 weeks ago developed shortness of breath and fever and was admitted for Covid.  Had remdesivir and IV steroids.  Joints did not bother him.  Since then has had more joint pain.[name] primary physician.  Rheumatoid factor XX 3 but CCP antibody negative.  Left second third MCP.  Both elbows.  Right knee.  Right ankle left shoulder.  Was admitted acutely.  Did have fever.  Not thought to be infected.  Since then has gotten steroids with hyperglycemia but has had improvement of his joints except for right knee and left wrist.  Creatinine 1.98.  White count 7500.  Sedimentation rate 117.  CRP 28. No family history of gout.  No history of kidney stones.  He has never been on allopurinol.  Is not on azathioprine for his transplant antirejection regimen but rather tacrolimus and mycophenolate Recent joint films have not shown definite erosions but some cystic changes in his wrist    PMH: Diabetes.  Renal disease.  Transplant.  Hypertension.  Coronary disease.  Congestive heart failure.  Pulmonary hypertension  SURGICAL HISTORY: Renal transplant.  No joint surgeries  Family History: Negative for gout  Social  History: No significant alcohol  Allergies:  Allergies  Allergen Reactions  . Acetaminophen Other (See Comments)    BC of transplant  . Contrast Media [Iodinated Diagnostic Agents]     Kidney transplant  . Ibuprofen Other (See Comments)    Due to kidney transplant Due to kidney transplant Due to kidney transplant    Medications:  Scheduled: . [START ON 07/16/2019] allopurinol  50 mg Oral Daily  . amLODipine  5 mg Oral Daily  . apixaban  5 mg Oral BID  . vitamin C  250 mg Oral Daily  . budesonide  2 puff Inhalation BID  . buPROPion  150 mg Oral BID  . ferrous sulfate  650 mg Oral BID  . gabapentin  300 mg Oral QHS  . insulin aspart  0-15 Units Subcutaneous TID PC & HS  . insulin glargine  12 Units Subcutaneous QHS  . mouth rinse  15 mL Mouth Rinse BID  . methylPREDNISolone (SOLU-MEDROL) injection  20 mg Intravenous Q8H  . montelukast  10 mg Oral QHS  . multivitamin with minerals  1 tablet Oral Daily  . mycophenolate  360 mg Oral BID  . pantoprazole  40 mg Oral Daily  . rosuvastatin  10 mg Oral Daily  . tacrolimus  1 mg Oral BID  . tamsulosin  0.4 mg Oral Daily  . torsemide  80 mg Oral q AM   And  . torsemide  40 mg Oral QPM  . triamcinolone acetonide  40 mg  Intramuscular Once  . zinc sulfate  220 mg Oral BID        ROS: No recent chest pain.  No recent fever or abdominal pain.  No new rashes.  No history of cirrhosis   PHYSICAL EXAM: Blood pressure 110/60, pulse 83, temperature 98.2 F (36.8 C), temperature source Oral, resp. rate (!) 22, height 5' 10.5" (1.791 m), weight 111.9 kg, SpO2 91 %. Pleasant male.  Hurts to move his right knee or his left knee.  Otherwise in no distress.  Sclera clear.  Clear chest.  Nontender abdomen.  1+ edema. Musculoskeletal: Cervical spine moves reasonably.  Right shoulder moves well.  Mild painful arc of motion left shoulder.  No elbow tophi or olecranon bursa swelling.  Left wrist is synovitis left second third MCPs have synovitis.   Right hand with no significant wrist MCP synovitis.  Left knee without synovitis right knee with effusion.  Mild decreased range of motion right ankle.  MTPs nontender left ankle moves well.  No synovitis  Assessment: Polyarticular gout.  Prior crystal diagnosis.  Probably worsened after diuretics and renal insufficiency during his January hospitalization.  Had improvement when he had steroids around the time of Covid..  Suspect elevated sed rate and CRP are in the most part due to his gouty inflammation rather than some other etiology Even though he has low titer rheumatoid factor, his course is inconsistent with rheumatoid arthritis  Chronic kidney disease probably related to diabetes. Status post renal transplant on immunosuppression Recent Covid Congestive heart failure and coronary disease Diabetes, poorly controlled with recent steroids  Recommendations: Procedure: Right knee prepped sterile manner.  Aspirated 25 cc inflammatory fluid.  Sent for microscopy.  Was consistent with gouty arthritis and significant inflammation.  Injected with 1 cc 40 mg  Kenalog Uric acid Careful introduction of allopurinol.  50 mg 1 p.o. daily Brace left wrist Would consider lowering his steroid dose and converting to 60 mg of prednisone with a taper over the next week. Hesitant to use colchicine given his level of renal insufficiency. We would be happy to see back in follow-up in the rheumatology clinic  [name] 07/15/2019, 4:50 PM

## 2019-07-15 NOTE — Progress Notes (Signed)
PROGRESS NOTE    [name].  LJQ:[id number] DOB: [birth year] DOA: 07/12/2019 PCP: [name], MD    Brief Narrative:  Briefly, patient is a 59 year old man with multiple medical problems including status post renal transplantation in 2012 presently on Prograf, HFpEF, atrial fibrillation on Eliquis, CAD status post CABG in 2005 denies MI and pulmonary artery hypertension who was admitted 06/30/2019-07/04/2019 for COVID pneumonia. Patient had received first Covid vaccine 5 weeks previously, he had lost his mother to Covid earlier this year. Now readmitted for acute onset of left arm anterior chest wall pain.   While in the emergency room, Left upper extremity venous Doppler revealed occluded cephalic vein at the AV fistula outflow tract in the antecubital fossa with no other thrombus identified. Chest x-ray showed Central pulmonary vascular congestion without definite pulmonary edema or consolidative process.  4/21: Still with severe pain in the left arm and decreased range of motion.  Also with pain and decreased range of motion of bilateral ankles and bilateral knees.  Not responding to narcotic therapy yesterday.  4/22: Patient seen and examined.  Range of motion of left arm and bilateral lower extremities has improved.  Appears to be responding to the steroid narcotic therapy.  Patient was upset about his needs not being met.  Had a lengthy conversation.  Informed current bedside RN.  Questions answered to patient satisfaction.  Pending formal rheumatology evaluation.   Assessment & Plan:   Active Problems:   AV fistula occlusion (HCC)  Polyarticular arthropathy History of RA History of gout Patient's multijoint involvement points to potential rheumatologic cause Patient was very tender to any movement or palpation pain was unresponsive to narcotic therapy Potentially do to polyarticular gout versus RA flare RF positive previously Improving on IV steroids Xray  imaging survey significant for perichondral change in right ankle and nonspecific effusion left knee Plan: Pain control Continue Solu-Medrol 40 mg every 8 hours for now Pending rheumatology recs.  Appreciated  Left AV fistula occlusion with cephalic vein thrombosis on Eliquis with acute respiratory failurewith hypoxia of unclear etiology, rule out acute pulmonary embolism. -swtich IV heparin drip back to Eliquis as no vascular intervention planned -Pain management  -Vascular surgery input appreciated - no intervention planned as patient doesn't use LUE dialysis access and to avoid further kidney injury considering transplant status -O2 protocol will be followed. -VQ scan negative for PE Continue home p.o. diuretics  Gout. -continue allopurinol. - See above management of polyarticular arthropathy  Type 2 diabetes mellitus. -SSI and lantus for now. DM nurse c/s  Dyslipidemia. -continue statin therapy.  BPH. -continue Flomax.  Heart failure reduced ejection fraction - EF 45-50% Patient follows his dry weight very carefully and knows how to tailor his diuretics to weight. Follows with [facility] cardio. Will consider c/s if need  Status post renal transplant/CKD 3A Creatinine slowly uptrending over interval Plan Continue current IS regimen Daily tac levels Nephrology consulted, recs appreciated  Permanent Atrial fibrillation Patient does not appear to be on any rate control medications per med rec however heart rate is low normal. Continue Eliquis  CAD Patient has remote history of CABG, denies history of MI in past Patient does not appear to be on a beta-blocker on admission, possibly secondary to baseline bradycardia I do not see any antiplatelet agents on med rec, this can be addressed as an outpatient as warranted.  Depression Continue Wellbutrin  Hypertension. -continue Norvasc.   DVT prophylaxis: Eliquis Code Status: Full Family Communication: Wife  Mateo Flow [phone] on 4/21 Disposition Plan: Status is: Inpatient  Remains inpatient appropriate because:Inpatient level of care appropriate due to severity of illness   Dispo: The patient is from: Home              Anticipated d/c is to: Home              Anticipated d/c date is: 2 days              Patient currently is not medically stable to d/c.  Still with significant pain and limited ROM.  Improving over interval but not at goal.       Consultants:   ID  Vascular sx  Rheumatology  Nephrology  Procedures:   none  Antimicrobials:   none   Subjective: Seen and examined Still with joint pain Improving over interval No fevers noted All cultures negative  Objective: Vitals:   07/14/19 1634 07/14/19 1726 07/14/19 1935 07/15/19 0058  BP: 110/68 (!) 144/75  (!) 157/78  Pulse: 92 87  88  Resp: _0 Temp: 98.4 F (36.9 C) 98.4 F (36.9 C)  97.9 F (36.6 C)  TempSrc: Oral Oral  Oral  SpO2: 97% 97% 97% 97%  Weight:      Height:        Intake/Output Summary (Last 24 hours) at 07/15/2019 1055 Last data filed at 07/15/2019 0900 Gross per 24 hour  Intake 44.21 ml  Output 900 ml  Net -855.79 ml   Filed Weights   07/12/19 1229 07/14/19 0502  Weight: 110.7 kg 111.9 kg    Examination:  General exam: Appears calm and comfortable  Respiratory system: Clear to auscultation. Respiratory effort normal. Cardiovascular system: S1 & S2 heard, RRR. No JVD, murmurs, rubs, gallops or clicks. No pedal edema. Gastrointestinal system: Abdomen is nondistended, soft and nontender. No organomegaly or masses felt. Normal bowel sounds heard. Central nervous system: Alert and oriented. No focal neurological deficits. Extremities: Left upper extremity and bilateral lower extremity tender to touch, decreased range of motion, pain on exertion Skin: No rashes, lesions or ulcers Psychiatry: Judgement and insight appear normal. Mood & affect appropriate.     Data  Reviewed: I have personally reviewed following labs and imaging studies  CBC: Recent Labs  Lab 07/12/19 1230 07/13/19 0432 07/14/19 0424 07/15/19 0615  WBC 8.5 7.9 11.5* 7.5  NEUTROABS  --   --   --  6.6  HGB 11.5* 11.3* 10.2* 11.0*  HCT 37.3* 36.6* 32.4* 35.2*  MCV 80.7 81.0 79.8* 80.4  PLT 148* 141* 139* 510   Basic Metabolic Panel: Recent Labs  Lab 07/12/19 1230 07/14/19 0424 07/15/19 0615  NA 136 139 135  K 5.3* 3.7 3.9  CL 98 96* 94*  CO2 _1 GLUCOSE 202* 124* 358*  BUN 23* 31* 55*  CREATININE 1.40* 1.88* 1.98*  1.89*  CALCIUM 9.2 8.4* 8.8*   GFR: Estimated Creatinine Clearance: 53.2 mL/min (A) (by C-G formula based on SCr of 1.89 mg/dL (H)). Liver Function Tests: No results for input(s): AST, ALT, ALKPHOS, BILITOT, PROT, ALBUMIN in the last 168 hours. No results for input(s): LIPASE, AMYLASE in the last 168 hours. No results for input(s): AMMONIA in the last 168 hours. Coagulation Profile: Recent Labs  Lab 07/12/19 1230  INR 1.8*   Cardiac Enzymes: No results for input(s): CKTOTAL, CKMB, CKMBINDEX, TROPONINI in the last 168 hours. BNP (last 3 results) No results for input(s): PROBNP in the last 8760 hours. HbA1C:  No results for input(s): HGBA1C in the last 72 hours. CBG: Recent Labs  Lab 07/14/19 1202 07/14/19 1635 07/14/19 2116 07/15/19 0029 07/15/19 0736  GLUCAP 164* 183* 249* 318* 322*   Lipid Profile: No results for input(s): CHOL, HDL, LDLCALC, TRIG, CHOLHDL, LDLDIRECT in the last 72 hours. Thyroid Function Tests: No results for input(s): TSH, T4TOTAL, FREET4, T3FREE, THYROIDAB in the last 72 hours. Anemia Panel: No results for input(s): VITAMINB12, FOLATE, FERRITIN, TIBC, IRON, RETICCTPCT in the last 72 hours. Sepsis Labs: No results for input(s): PROCALCITON, LATICACIDVEN in the last 168 hours.  Recent Results (from the past 240 hour(s))  CULTURE, BLOOD (ROUTINE X 2) w Reflex to ID Panel     Status: None (Preliminary result)    Collection Time: 07/13/19  7:07 PM   Specimen: BLOOD  Result Value Ref Range Status   Specimen Description BLOOD BLOOD RIGHT HAND  Final   Special Requests   Final    BOTTLES DRAWN AEROBIC AND ANAEROBIC Blood Culture adequate volume   Culture   Final    NO GROWTH 2 DAYS Performed at [facility], [address], [place], [place] [address]    Report Status PENDING  Incomplete  CULTURE, BLOOD (ROUTINE X 2) w Reflex to ID Panel     Status: None (Preliminary result)   Collection Time: 07/13/19  7:08 PM   Specimen: BLOOD  Result Value Ref Range Status   Specimen Description BLOOD RIGHT ANTECUBITAL  Final   Special Requests   Final    BOTTLES DRAWN AEROBIC AND ANAEROBIC Blood Culture adequate volume   Culture   Final    NO GROWTH 2 DAYS Performed at [facility], [address] [place], [place] [address]    Report Status PENDING  Incomplete         Radiology Studies: DG Elbow 2 Views Left  Result Date: 07/14/2019 CLINICAL DATA:  Rheumatoid arthritis, polyarthralgia, no known injury EXAM: LEFT ELBOW - 2 VIEW COMPARISON:  None. FINDINGS: There is no evidence of fracture, dislocation, or joint effusion. There is no evidence of arthropathy or other focal bone abnormality. Vascular calcinosis and surgical clips. IMPRESSION: No fracture or dislocation of the left elbow. Joint spaces are preserved. No elbow joint effusion. Electronically Signed   By: Eddie Candle M.D.   On: 07/14/2019 12:45   DG Wrist 2 Views Left  Result Date: 07/14/2019 CLINICAL DATA:  Rheumatoid arthritis, polyarthralgia, no known injury EXAM: LEFT WRIST - 2 VIEW COMPARISON:  None. FINDINGS: There is no evidence of fracture or dislocation. There is no evidence of arthropathy or other focal bone abnormality. Surgical clips about the radial wrist. IMPRESSION: No fracture or dislocation of the left wrist. The carpus is normally aligned. Joint spaces are preserved. Electronically Signed   By: Eddie Candle  M.D.   On: 07/14/2019 12:44   DG Knee 1-2 Views Left  Result Date: 07/14/2019 CLINICAL DATA:  Rheumatoid arthritis, polyarthralgia, no known injury EXAM: LEFT KNEE - 1-2 VIEW COMPARISON:  None. FINDINGS: No evidence of fracture, dislocation, or joint effusion. No evidence of arthropathy or other focal bone abnormality. Vascular calcinosis. IMPRESSION: No fracture or dislocation of the left knee. Joint spaces are preserved. Electronically Signed   By: Eddie Candle M.D.   On: 07/14/2019 12:51   DG Knee 1-2 Views Right  Result Date: 07/14/2019 CLINICAL DATA:  Rheumatoid arthritis, polyarthralgia, no known injury EXAM: RIGHT KNEE - 1-2 VIEW COMPARISON:  None. FINDINGS: No evidence of fracture or dislocation. Probable  knee joint effusion. No evidence of arthropathy or other focal bone abnormality. Vascular calcinosis. Surgical clips about the medial knee. IMPRESSION: 1. No fracture or dislocation of the right knee. Joint spaces are preserved. 2.  Probable nonspecific knee joint effusion. Electronically Signed   By: Eddie Candle M.D.   On: 07/14/2019 12:50   DG Ankle 2 Views Left  Result Date: 07/14/2019 CLINICAL DATA:  Rheumatoid arthritis, polyarthralgia, no known injury EXAM: LEFT ANKLE - 2 VIEW COMPARISON:  None. FINDINGS: There is no evidence of fracture, dislocation, or joint effusion. There is no evidence of arthropathy or other focal bone abnormality. Vascular calcinosis. IMPRESSION: No fracture or dislocation of the left ankle. Joint spaces are preserved. Electronically Signed   By: Eddie Candle M.D.   On: 07/14/2019 12:48   DG Ankle 2 Views Right  Result Date: 07/14/2019 CLINICAL DATA:  Rheumatoid arthritis, polyarthralgia, no known injury EXAM: RIGHT ANKLE - 2 VIEW COMPARISON:  None. FINDINGS: No fracture or dislocation of the right ankle. There may be periarticular erosions and subchondral cystic change about the ankle mortise and tibiofibular articulation, not ideally evaluated although most  conspicuous on lateral view. Vascular calcinosis. IMPRESSION: 1. No fracture or dislocation of the right ankle. 2. There may be periarticular erosions and subchondral cystic change about the ankle mortise and tibiofibular articulation, not ideally evaluated although most conspicuous on lateral view. MRI may be used to further evaluate for inflammatory synovitis/periarthritis and degenerative change if desired. Electronically Signed   By: Eddie Candle M.D.   On: 07/14/2019 12:47   NM Pulmonary Perfusion  Result Date: 07/13/2019 CLINICAL DATA:  Hypoxemia EXAM: NUCLEAR MEDICINE PERFUSION LUNG SCAN TECHNIQUE: Perfusion images were obtained in multiple projections after intravenous injection of radiopharmaceutical. Ventilation scans intentionally deferred if perfusion scan and chest x-ray adequate for interpretation during COVID 19 epidemic. RADIOPHARMACEUTICALS:  4.1 mCi Tc-50mMAA IV COMPARISON:  Chest x-ray 07/12/2019 FINDINGS: No perfusion defects seen to suggest pulmonary embolus. IMPRESSION: No evidence of pulmonary embolus. Electronically Signed   By: KRolm BaptiseM.D.   On: 07/13/2019 22:12   DG Shoulder Left  Result Date: 07/14/2019 CLINICAL DATA:  Rheumatoid arthritis, polyarthritis, no known injury, COVID EXAM: LEFT SHOULDER - 2+ VIEW COMPARISON:  None. FINDINGS: There is no evidence of fracture or dislocation. There is no evidence of arthropathy or other focal bone abnormality. Soft tissues are unremarkable. IMPRESSION: No fracture or dislocation of the left shoulder. Joint spaces are preserved. Electronically Signed   By: AEddie CandleM.D.   On: 07/14/2019 12:43        Scheduled Meds: . amLODipine  5 mg Oral Daily  . apixaban  5 mg Oral BID  . vitamin C  250 mg Oral Daily  . budesonide  2 puff Inhalation BID  . buPROPion  150 mg Oral BID  . ferrous sulfate  650 mg Oral BID  . gabapentin  300 mg Oral QHS  . insulin aspart  0-15 Units Subcutaneous TID PC & HS  . insulin glargine  12  Units Subcutaneous QHS  . mouth rinse  15 mL Mouth Rinse BID  . methylPREDNISolone (SOLU-MEDROL) injection  40 mg Intravenous Q8H  . montelukast  10 mg Oral QHS  . multivitamin with minerals  1 tablet Oral Daily  . mycophenolate  360 mg Oral BID  . pantoprazole  40 mg Oral Daily  . rosuvastatin  10 mg Oral Daily  . tacrolimus  1 mg Oral BID  . tamsulosin  0.4 mg Oral  Daily  . torsemide  80 mg Oral q AM   And  . torsemide  40 mg Oral QPM  . zinc sulfate  220 mg Oral BID   Continuous Infusions: . sodium chloride Stopped (07/14/19 0429)     LOS: 3 days    Time spent: 35 min    Sidney Ace, MD [facility] Pager [phone]  If 7PM-7AM, please contact night-coverage 07/15/2019, 10:55 AM

## 2019-07-15 NOTE — Progress Notes (Signed)
Inpatient Diabetes Program Recommendations  AACE/ADA: New Consensus Statement on Inpatient Glycemic Control   Target Ranges:  Prepandial:   less than 140 mg/dL      Peak postprandial:   less than 180 mg/dL (1-2 hours)      Critically ill patients:  140 - 180 mg/dL   Results for AUSTEN, OYSTER (MRN [id number]) as of 07/15/2019 12:11  Ref. Range 07/14/2019 07:59 07/14/2019 12:02 07/14/2019 16:35 07/14/2019 21:16 07/15/2019 00:29 07/15/2019 07:36 07/15/2019 11:24  Glucose-Capillary Latest Ref Range: 70 - 99 mg/dL 166 (H) 164 (H) 183 (H) 249 (H) 318 (H) 322 (H) 376 (H)  Results for CORBET, HANLEY (MRN [id number]) as of 07/15/2019 12:11  Ref. Range 05/04/2019 03:19 06/30/2019 20:06  Hemoglobin A1C Latest Ref Range: 4.8 - 5.6 % 8.2 (H) 10.3 (H)   Review of Glycemic Control  Diabetes history: DM2 Outpatient Diabetes medications: Lantus 15 units QHS, Humalog 5-10 units TID with meals, Farxiga 10 mg QAM Current orders for Inpatient glycemic control: Lantus 12 units QHS, Novolog 0-15 units AC&HS; Solumedrol 40 mg Q8H  Inpatient Diabetes Program Recommendations:   Insulin - Basal: If steroids are continued, please consider increasing Lantus to 17 units QHS.  Insulin - Meal Coverage: If steroids are continued, please consider ordering Novolog 4 units TID with meals for meal coverage if patient eats at least 50% of meals.  HgbA1C: A1C 10.3% on 06/30/19. Noted patient recently admitted 07/01/19-07/04/19 with COVID and treated with steroids. Patient sees Dr. Gabriel Carina (Endocrinologist) and was last seen 04/06/19.  NOTE: Noted patient sees Dr. Gabriel Carina for DM management and was last seen 04/06/19 and per office visit note patient is prescribed Lantus 28 units QHS, Farxiga 10 mg daily, Humalog 5 units TID with meals plus additional 2-12 units based on glucose. Noted patient was recently inpatient 07/01/19-07/04/19 with COVID and pneumonia. Patient is currently ordered steroids which is contributing to hyperglycemia.    Thanks, Barnie Alderman, RN, MSN, CDE Diabetes Coordinator Inpatient Diabetes Program [phone] (Team Pager from 8am to 5pm)

## 2019-07-15 NOTE — TOC Initial Note (Signed)
Transition of Care (TOC) - Initial/Assessment Note    Patient Details  Name: [name]. MRN: [id number] Date of Birth: 15-Feb-1961  Transition of Care [facility]) CM/SW Contact:    Shelbie Ammons, RN Phone Number: 07/15/2019, 1:32 PM  Clinical Narrative:       RNCM assessed patient by telephone due to isolation precautions. Patient was admitted to hospital from [facility] due to severe pain related to blood clot. Patient is agreeable to talk to this CM and reports that he has some serious concerns with his treatment while in the hospital. Listened and offered support and discussed that this CM would place call to office of patient experience for them to follow up which he was grateful for. Patient reports that he lives in single level home with is wife and that up until a week or so ago he was driving and independently taking care of himself. Patient reports he has a cane at home and an oxygen concentrator but has not used it recently. He reports that he has had home health before and would be open to it again if he needs it. Patient reports he gets his medications from the employee pharmacy and has no trouble with obtaining them. RNCM will follow for any needs.         Expected Discharge Plan: [place] Barriers to Discharge: Continued Medical Work up   Patient Goals and CMS Choice        Expected Discharge Plan and Services Expected Discharge Plan: [place]   Discharge Planning Services: CM Consult   Living arrangements for the past 2 months: Single Family Home                                      Prior Living Arrangements/Services Living arrangements for the past 2 months: Single Family Home Lives with:: Spouse Patient language and need for interpreter reviewed:: Yes Do you feel safe going back to the place where you live?: Yes      Need for Family Participation in Patient Care: Yes (Comment) Care giver support system in  place?: Yes (comment)   Criminal Activity/Legal Involvement Pertinent to Current Situation/Hospitalization: No - Comment as needed  Activities of Daily Living Home Assistive Devices/Equipment: BIPAP, Eyeglasses, Walker (specify type) ADL Screening (condition at time of admission) Patient's cognitive ability adequate to safely complete daily activities?: Yes Is the patient deaf or have difficulty hearing?: No Does the patient have difficulty seeing, even when wearing glasses/contacts?: No Does the patient have difficulty concentrating, remembering, or making decisions?: No Patient able to express need for assistance with ADLs?: Yes Does the patient have difficulty dressing or bathing?: No Independently performs ADLs?: Yes (appropriate for developmental age) Does the patient have difficulty walking or climbing stairs?: No Weakness of Legs: Both Weakness of Arms/Hands: None  Permission Sought/Granted                  Emotional Assessment   Attitude/Demeanor/Rapport: Engaged Affect (typically observed): Appropriate Orientation: : Oriented to Self, Oriented to Place, Oriented to  Time, Oriented to Situation Alcohol / Substance Use: Not Applicable Psych Involvement: No (comment)  Admission diagnosis:  AV fistula occlusion ([place]) [I77.824M] Cephalic vein thrombosis, left [I82.612] Acute respiratory failure with hypoxia (HCC) [J96.01] Arm pain, anterior, left [M79.602] Patient Active Problem List   Diagnosis Date Noted  . AV fistula occlusion ([place]) 07/12/2019  .  Sepsis with acute hypoxic respiratory failure without septic shock ([place])   . Permanent atrial fibrillation ([place])   . Pneumonia due to COVID-19 virus 07/01/2019  . Atypical pneumonia 07/01/2019  . COVID-19 07/01/2019  . Synovitis of finger 06/11/2019  . Pulmonary hypertension due to left heart disease ([place]) 01/20/2019  . Right knee pain 01/20/2019  . Chest pain with high risk of acute coronary syndrome 01/15/2019  .  Trigger finger, unspecified little finger 12/23/2018  . Superficial thrombophlebitis 12/23/2018  . Fever 11/10/2018  . SOB (shortness of breath) 11/06/2018  . CHF (congestive heart failure) ([place]) 11/05/2018  . MDD (major depressive disorder), single episode, mild ([place]) 04/29/2018  . Acute gout 03/30/2018  . Cervical sprain, initial encounter 03/02/2018  . Moderate persistent asthma 05/21/2017  . Cough 02/10/2017  . Chronic diastolic heart failure ([place]) 11/19/2016  . Chronic venous insufficiency 06/05/2016  . Chronic ulcer of left leg ([place]) 10/26/2015  . Cytomegalovirus (CMV) viremia ([place]) 09/07/2015  . Proteinuria 06/23/2015  . Hypokalemia 10/13/2014  . Bradycardia 10/13/2014  . BPH with obstruction/lower urinary tract symptoms 09/25/2014  . Hypogonadism in male 09/25/2014  . Immunosuppressed status ([place]) 09/09/2014  . Type 2 diabetes mellitus with hyperglycemia, with long-term current use of insulin ([place]) 09/09/2014  . Chronic atrial fibrillation ([place]) 07/18/2014  . DM eye manif type II 10/27/2013  . MDD (recurrent major depressive disorder) in remission ([place]) 10/04/2013  . Benign neoplasm of colon 04/22/2013  . Diverticulosis of colon (without mention of hemorrhage) 04/22/2013  . History of renal transplant 03/01/2013  . Asthma, persistent controlled 02/25/2013  . Erectile dysfunction 10/20/2012  . Obstructive sleep apnea 09/29/2012  . Obesity hypoventilation syndrome ([place]) 03/31/2012  . Routine general medical examination at a health care facility 11/12/2011  . Type 2 diabetes, controlled, with renal manifestation ([place]) 03/01/2011  . Essential hypertension 12/17/2010  . Coronary artery disease involving native coronary artery of native heart without angina pectoris 12/17/2010  . Hyperlipidemia   . ESRD (end stage renal disease) ([place])    PCP:  [name], MD Pharmacy:   [facility] [place] [address] Phone: [phone] Fax: [phone]     Social Determinants of Health (SDOH) Interventions    Readmission Risk Interventions Readmission Risk Prevention Plan 07/15/2019 07/02/2019 05/06/2019  Transportation Screening Complete Complete Complete  HRI or Home Care Consult - - Complete  Social Work Consult for [place] Planning/Counseling - - Complete  Palliative Care Screening - - Not Applicable  Medication Review Press photographer) Complete Complete Complete  PCP or Specialist appointment within 3-5 days of discharge Complete Complete -  SW Recovery Care/Counseling Consult Complete - -  Palliative Care Screening Not Applicable Not Applicable -  [place] Not Applicable Not Applicable -  Some recent data might be hidden

## 2019-07-15 NOTE — Patient Outreach (Signed)
[facility]) Care Management  07/15/2019  [name] 1960-12-16 [id number]   Care Coordination   Referral received:07/02/19 Initial outreach:07/06/19 Insurance: UMR    Per electronic record, patient with readmission to [facility] on 07/12/19.    Plan Will follow progress and disposition plans for transition of care follow up.   Joylene Draft, RN, BSN  [place] Management Coordinator  [phone]- Mobile [phone]- Toll Free Main Office

## 2019-07-15 NOTE — Evaluation (Signed)
Physical Therapy Evaluation Patient Details Name: [name]. MRN: [id number] DOB: 04-Aug-1960 Today's Date: 07/15/2019   History of Present Illness  Pt is a 60 year old man with multiple medical problems including status post renal transplantation in 2012 presently on Prograf, HFpEF, atrial fibrillation on Eliquis, CAD status post CABG in 2005 denies MI and pulmonary artery hypertension who was admitted 06/30/2019-07/04/2019 for COVID pneumonia. Now readmitted for acute onset of left arm anterior chest wall pain. LUE extremity doppler showed occluded cephalic vein at AV fistula, chest xray showed central pulmonary vascular congestion. Complaining of severe L arm pain, decreased ROM, and bilateral knee and ankle pain as well.    Clinical Impression  Pt alert, requested to use BSC at start of session. Reported at home usually independent/modI, has a SPC in the past with pain, but has never experienced pain like this in the past. Lives with his wife, who works in the ED for 12 hour shifts.  The patient exhibited decreased ROM of R knee, L shoulder, and bilateral ankles. The patient performed supine <> sit with modA due to significant pain. Sit <> stand from elevated bed surface, minAx2 and RW. Pt was able to take few shuffled steps and turn to [facility]. Pt very stiff, complained of significant pain throughout his body (9/10, and difficulty holding onto walker with L hand). From [facility] minAwith RW, totalA for pericare.  Overall the patient demonstrated deficits (see "PT Problem List") that impede the patient's functional abilities, safety, and mobility and would benefit from skilled PT intervention. Recommendation is SNF due to current level of assistance needed and decreased caregiver support.     Follow Up Recommendations SNF    Equipment Recommendations  Rolling walker with 5" wheels;3in1 (PT)    Recommendations for Other Services       Precautions / Restrictions Precautions Precautions:  Fall Restrictions Weight Bearing Restrictions: No      Mobility  Bed Mobility Overal bed mobility: Needs Assistance Bed Mobility: Supine to Sit;Sit to Supine     Supine to sit: Mod assist;HOB elevated Sit to supine: Mod assist   General bed mobility comments: mod for weight shift and LE assistance  Transfers Overall transfer level: Needs assistance Equipment used: Rolling walker (2 wheeled) Transfers: Sit to/from Stand Sit to Stand: Min assist;+2 physical assistance         General transfer comment: from elevated EOB, minAx2, from [place].  Ambulation/Gait   Gait Distance (Feet): 2 Feet Assistive device: Rolling walker (2 wheeled)       General Gait Details: Pt able to take a few steps and turn to [facility] at EOB. very limited step height/length, decreased step length, very stiff through joints.  Stairs            Wheelchair Mobility    Modified Rankin (Stroke Patients Only)       Balance Overall balance assessment: Needs assistance Sitting-balance support: Feet supported Sitting balance-Leahy Scale: Fair       Standing balance-Leahy Scale: Poor Standing balance comment: reliant on RW                             Pertinent Vitals/Pain Pain Assessment: 0-10 Pain Score: 3  Pain Location: 3/10 pain at rest, 9/10 pain getting up with therapy Pain Descriptors / Indicators: Aching;Moaning;Grimacing Pain Intervention(s): Limited activity within patient's tolerance;Monitored during session;Repositioned    Home Living Family/patient expects to be discharged to:: Private residence Living Arrangements: Spouse/significant  other Available Help at Discharge: Family;Available PRN/intermittently Type of Home: House Home Access: Stairs to enter Entrance Stairs-Rails: None Entrance Stairs-Number of Steps: 4 Home Layout: One level Home Equipment: Cane - single point      Prior Function Level of Independence: Independent with assistive device(s)          Comments: Pt reported he was intermittently used a SPC in the last couple of days due to increased pain. usually independent     Hand Dominance   Dominant Hand: Right    Extremity/Trunk Assessment   Upper Extremity Assessment Upper Extremity Assessment: Generalized weakness;RUE deficits/detail;LUE deficits/detail RUE Deficits / Details: grossly 4/5 including grip strength LUE Deficits / Details: pt able to lift arm up to ~70degrees, flex/extend elbow, grip strength 3/5.    Lower Extremity Assessment Lower Extremity Assessment: RLE deficits/detail;LLE deficits/detail RLE Deficits / Details: grossly 3+/5, complained of significant pain of ankle and knee LLE Deficits / Details: grossly 4-/5, complained of pain of ankle and knee    Cervical / Trunk Assessment Cervical / Trunk Assessment: Normal  Communication   Communication: No difficulties  Cognition Arousal/Alertness: Awake/alert Behavior During Therapy: WFL for tasks assessed/performed Overall Cognitive Status: Within Functional Limits for tasks assessed                                        General Comments      Exercises Other Exercises Other Exercises: Pt educated on importance of continued mobility during hospital stay, out of bed mobility, and gentle ROM exercises of LE to address pain, pt verbalized understanding Other Exercises: transferred to [facility], totalA for pericare in standing while utilizing RW.   Assessment/Plan    PT Assessment Patient needs continued PT services  PT Problem List Decreased strength;Decreased mobility;Decreased range of motion;Decreased activity tolerance;Decreased balance;Decreased knowledge of use of DME;Pain       PT Treatment Interventions DME instruction;Therapeutic exercise;Gait training;Balance training;Stair training;Neuromuscular re-education;Functional mobility training;Therapeutic activities;Patient/family education    PT Goals (Current goals can be  found in the Care Plan section)  Acute Rehab PT Goals Patient Stated Goal: to decrease pain PT Goal Formulation: With patient Time For Goal Achievement: 07/29/19 Potential to Achieve Goals: Good    Frequency Min 2X/week   Barriers to discharge Decreased caregiver support      Co-evaluation               AM-PAC PT "6 Clicks" Mobility  Outcome Measure Help needed turning from your back to your side while in a flat bed without using bedrails?: A Lot Help needed moving from lying on your back to sitting on the side of a flat bed without using bedrails?: A Lot Help needed moving to and from a bed to a chair (including a wheelchair)?: A Lot Help needed standing up from a chair using your arms (e.g., wheelchair or bedside chair)?: A Lot Help needed to walk in hospital room?: A Lot Help needed climbing 3-5 steps with a railing? : Total 6 Click Score: 11    End of Session Equipment Utilized During Treatment: Gait belt;Oxygen(2L) Activity Tolerance: Patient limited by pain Patient left: in bed;with call bell/phone within reach;with bed alarm set Nurse Communication: Mobility status PT Visit Diagnosis: Other abnormalities of gait and mobility (R26.89);Muscle weakness (generalized) (M62.81);Difficulty in walking, not elsewhere classified (R26.2);Pain Pain - Right/Left: (bilateral) Pain - part of body: Knee;Leg;Arm;Shoulder    Time: 0355-9741 PT  Time Calculation (min) (ACUTE ONLY): 38 min   Charges:   PT Evaluation $PT Eval Moderate Complexity: 1 Mod PT Treatments $Therapeutic Exercise: 8-22 mins $Therapeutic Activity: 8-22 mins        Lieutenant Diego PT, DPT 4:12 PM,07/15/19

## 2019-07-16 ENCOUNTER — Inpatient Hospital Stay: Payer: 59

## 2019-07-16 LAB — GLUCOSE, CAPILLARY
Glucose-Capillary: 214 mg/dL — ABNORMAL HIGH (ref 70–99)
Glucose-Capillary: 257 mg/dL — ABNORMAL HIGH (ref 70–99)
Glucose-Capillary: 303 mg/dL — ABNORMAL HIGH (ref 70–99)
Glucose-Capillary: 346 mg/dL — ABNORMAL HIGH (ref 70–99)
Glucose-Capillary: 390 mg/dL — ABNORMAL HIGH (ref 70–99)
Glucose-Capillary: 470 mg/dL — ABNORMAL HIGH (ref 70–99)

## 2019-07-16 LAB — CBC WITH DIFFERENTIAL/PLATELET
Abs Immature Granulocytes: 0.05 10*3/uL (ref 0.00–0.07)
Basophils Absolute: 0 10*3/uL (ref 0.0–0.1)
Basophils Relative: 0 %
Eosinophils Absolute: 0 10*3/uL (ref 0.0–0.5)
Eosinophils Relative: 0 %
HCT: 35.4 % — ABNORMAL LOW (ref 39.0–52.0)
Hemoglobin: 11.2 g/dL — ABNORMAL LOW (ref 13.0–17.0)
Immature Granulocytes: 1 %
Lymphocytes Relative: 8 %
Lymphs Abs: 0.6 10*3/uL — ABNORMAL LOW (ref 0.7–4.0)
MCH: 24.9 pg — ABNORMAL LOW (ref 26.0–34.0)
MCHC: 31.6 g/dL (ref 30.0–36.0)
MCV: 78.8 fL — ABNORMAL LOW (ref 80.0–100.0)
Monocytes Absolute: 0.2 10*3/uL (ref 0.1–1.0)
Monocytes Relative: 3 %
Neutro Abs: 6.9 10*3/uL (ref 1.7–7.7)
Neutrophils Relative %: 88 %
Platelets: 189 10*3/uL (ref 150–400)
RBC: 4.49 MIL/uL (ref 4.22–5.81)
RDW: 18.6 % — ABNORMAL HIGH (ref 11.5–15.5)
WBC: 7.8 10*3/uL (ref 4.0–10.5)
nRBC: 0 % (ref 0.0–0.2)

## 2019-07-16 LAB — BASIC METABOLIC PANEL
Anion gap: 11 (ref 5–15)
BUN: 93 mg/dL — ABNORMAL HIGH (ref 6–20)
CO2: 31 mmol/L (ref 22–32)
Calcium: 9.3 mg/dL (ref 8.9–10.3)
Chloride: 95 mmol/L — ABNORMAL LOW (ref 98–111)
Creatinine, Ser: 2.1 mg/dL — ABNORMAL HIGH (ref 0.61–1.24)
GFR calc Af Amer: 39 mL/min — ABNORMAL LOW (ref >60)
GFR calc non Af Amer: 33 mL/min — ABNORMAL LOW (ref >60)
Glucose, Bld: 273 mg/dL — ABNORMAL HIGH (ref 70–99)
Potassium: 4 mmol/L (ref 3.5–5.1)
Sodium: 137 mmol/L (ref 135–145)

## 2019-07-16 LAB — URIC ACID: Uric Acid, Serum: 14.9 mg/dL — ABNORMAL HIGH (ref 3.7–8.6)

## 2019-07-16 LAB — C-REACTIVE PROTEIN: CRP: 14.4 mg/dL — ABNORMAL HIGH (ref <1.0)

## 2019-07-16 MED ORDER — INSULIN GLARGINE 100 UNIT/ML ~~LOC~~ SOLN
20.0000 [IU] | Freq: Every day | SUBCUTANEOUS | Status: DC
  Administered 2019-07-16: 20:00:00 20 [IU] via SUBCUTANEOUS
  Filled 2019-07-16 (×2): qty 0.2

## 2019-07-16 MED ORDER — INSULIN ASPART 100 UNIT/ML ~~LOC~~ SOLN
0.0000 [IU] | Freq: Three times a day (TID) | SUBCUTANEOUS | Status: DC
  Administered 2019-07-16: 13:00:00 11 [IU] via SUBCUTANEOUS
  Administered 2019-07-16: 15 [IU] via SUBCUTANEOUS
  Filled 2019-07-16 (×3): qty 1

## 2019-07-16 MED ORDER — INSULIN ASPART 100 UNIT/ML ~~LOC~~ SOLN
5.0000 [IU] | Freq: Three times a day (TID) | SUBCUTANEOUS | Status: DC
  Administered 2019-07-16 – 2019-07-17 (×3): 5 [IU] via SUBCUTANEOUS
  Filled 2019-07-16 (×4): qty 1

## 2019-07-16 MED ORDER — ALUM & MAG HYDROXIDE-SIMETH 200-200-20 MG/5ML PO SUSP
30.0000 mL | Freq: Four times a day (QID) | ORAL | Status: DC | PRN
  Administered 2019-07-16: 30 mL via ORAL
  Filled 2019-07-16: qty 30

## 2019-07-16 MED ORDER — PREDNISONE 50 MG PO TABS
60.0000 mg | ORAL_TABLET | Freq: Every day | ORAL | Status: AC
  Administered 2019-07-16: 60 mg via ORAL
  Filled 2019-07-16: qty 1

## 2019-07-16 MED ORDER — INSULIN ASPART 100 UNIT/ML ~~LOC~~ SOLN
0.0000 [IU] | Freq: Three times a day (TID) | SUBCUTANEOUS | Status: DC
  Administered 2019-07-16: 22:00:00 20 [IU] via SUBCUTANEOUS
  Administered 2019-07-17: 21:00:00 15 [IU] via SUBCUTANEOUS
  Administered 2019-07-17 (×2): 11 [IU] via SUBCUTANEOUS
  Administered 2019-07-17: 13:00:00 15 [IU] via SUBCUTANEOUS
  Administered 2019-07-18: 12:00:00 20 [IU] via SUBCUTANEOUS
  Administered 2019-07-18: 10:00:00 7 [IU] via SUBCUTANEOUS
  Filled 2019-07-16 (×7): qty 1

## 2019-07-16 MED ORDER — SODIUM CHLORIDE 0.9 % IV SOLN: INTRAVENOUS | Status: DC

## 2019-07-16 MED ORDER — TACROLIMUS 1 MG PO CAPS
2.0000 mg | ORAL_CAPSULE | Freq: Two times a day (BID) | ORAL | Status: DC
  Administered 2019-07-16 – 2019-07-18 (×4): 2 mg via ORAL
  Filled 2019-07-16 (×5): qty 2

## 2019-07-16 NOTE — Progress Notes (Signed)
[facility]  ROUNDING NOTE   Subjective:  Patient well-known to [name] from prior admissions. Had recent admission from 4/7-/11/21 for Covid pneumonia. Subsequently return for left arm and anterior chest wall pain. Patient had renal transplant performed back in 2012 at [facility]. His baseline creatinine appears to be between 1.4-1.6. Renal function significantly worse now with a BUN up to 93 with a creatinine of 2.10. Patient noted to be on torsemide. He also appears to have had a recent gout attack.   Objective:  Vital signs in last 24 hours:  Temp:  [98.2 F (36.8 C)-98.9 F (37.2 C)] 98.9 F (37.2 C) (04/23 0922) Pulse Rate:  [74-83] 74 (04/23 0922) Resp:  [17-22] 17 (04/23 0922) BP: (110-142)/(60-78) 142/78 (04/23 0922) SpO2:  [91 %-93 %] 93 % (04/23 0922)  Weight change:  Filed Weights   07/12/19 1229 07/14/19 0502  Weight: 110.7 kg 111.9 kg    Intake/Output: I/O last 3 completed shifts: In: 764.2 [P.O.:720; I.V.:44.2] Out: 1051 [Urine:1050; Stool:1]   Intake/Output this shift:  Total I/O In: -  Out: 400 [Urine:400]  Physical Exam: General: No acute distress  Head: Normocephalic, atraumatic. Moist oral mucosal membranes  Eyes: Anicteric  Neck: Supple, trachea midline  Lungs:  Nonlabored respirations  Heart: S1S2   Abdomen:  Soft, nontender, bowel sounds present  Extremities: No peripheral edema.  Neurologic: Awake, alert, following commands  Skin: No acute rash       Basic Metabolic Panel: Recent Labs  Lab 07/12/19 1230 07/12/19 1230 07/14/19 0424 07/15/19 0615 07/16/19 0639  NA 136  --  139 135 137  K 5.3*  --  3.7 3.9 4.0  CL 98  --  96* 94* 95*  CO2 27  --  _0 GLUCOSE 202*  --  124* 358* 273*  BUN 23*  --  31* 55* 93*  CREATININE 1.40*  --  1.88* 1.98*  1.89* 2.10*  CALCIUM 9.2   < > 8.4* 8.8* 9.3   < > = values in this interval not displayed.    Liver Function Tests: No results for input(s): AST, ALT,  ALKPHOS, BILITOT, PROT, ALBUMIN in the last 168 hours. No results for input(s): LIPASE, AMYLASE in the last 168 hours. No results for input(s): AMMONIA in the last 168 hours.  CBC: Recent Labs  Lab 07/12/19 1230 07/13/19 0432 07/14/19 0424 07/15/19 0615 07/16/19 0639  WBC 8.5 7.9 11.5* 7.5 7.8  NEUTROABS  --   --   --  6.6 6.9  HGB 11.5* 11.3* 10.2* 11.0* 11.2*  HCT 37.3* 36.6* 32.4* 35.2* 35.4*  MCV 80.7 81.0 79.8* 80.4 78.8*  PLT 148* 141* 139* 152 189    Cardiac Enzymes: No results for input(s): CKTOTAL, CKMB, CKMBINDEX, TROPONINI in the last 168 hours.  BNP: Invalid input(s): POCBNP  CBG: Recent Labs  Lab 07/15/19 2139 07/16/19 0041 07/16/19 0424 07/16/19 0920 07/16/19 1153  GLUCAP 414* 390* 303* 214* 53*    Microbiology: Results for orders placed or performed during the hospital encounter of 07/12/19  CULTURE, BLOOD (ROUTINE X 2) w Reflex to ID Panel     Status: None (Preliminary result)   Collection Time: 07/13/19  7:07 PM   Specimen: BLOOD  Result Value Ref Range Status   Specimen Description BLOOD BLOOD RIGHT HAND  Final   Special Requests   Final    BOTTLES DRAWN AEROBIC AND ANAEROBIC Blood Culture adequate volume   Culture   Final    NO GROWTH  3 DAYS Performed at [facility], [address] [place], [place] [address]    Report Status PENDING  Incomplete  CULTURE, BLOOD (ROUTINE X 2) w Reflex to ID Panel     Status: None (Preliminary result)   Collection Time: 07/13/19  7:08 PM   Specimen: BLOOD  Result Value Ref Range Status   Specimen Description BLOOD RIGHT ANTECUBITAL  Final   Special Requests   Final    BOTTLES DRAWN AEROBIC AND ANAEROBIC Blood Culture adequate volume   Culture   Final    NO GROWTH 3 DAYS Performed at [facility], [address], [place],  [address]    Report Status PENDING  Incomplete    Coagulation Studies: No results for input(s): LABPROT, INR in the last 72 hours.  Urinalysis: No  results for input(s): COLORURINE, LABSPEC, PHURINE, GLUCOSEU, HGBUR, BILIRUBINUR, KETONESUR, PROTEINUR, UROBILINOGEN, NITRITE, LEUKOCYTESUR in the last 72 hours.  Invalid input(s): APPERANCEUR    Imaging: No results found.   Medications:   . sodium chloride Stopped (07/14/19 0429)  . sodium chloride 75 mL/hr at 07/16/19 1330   . allopurinol  50 mg Oral Daily  . amLODipine  5 mg Oral Daily  . apixaban  5 mg Oral BID  . vitamin C  250 mg Oral Daily  . budesonide  2 puff Inhalation BID  . buPROPion  150 mg Oral BID  . ferrous sulfate  650 mg Oral BID  . gabapentin  300 mg Oral QHS  . insulin aspart  0-20 Units Subcutaneous TID WC  . insulin aspart  5 Units Subcutaneous TID WC  . insulin glargine  20 Units Subcutaneous QHS  . mouth rinse  15 mL Mouth Rinse BID  . montelukast  10 mg Oral QHS  . multivitamin with minerals  1 tablet Oral Daily  . mycophenolate  360 mg Oral BID  . pantoprazole  40 mg Oral Daily  . rosuvastatin  10 mg Oral Daily  . tacrolimus  2 mg Oral BID  . tamsulosin  0.4 mg Oral Daily  . zinc sulfate  220 mg Oral BID   sodium chloride, acetaminophen, alum & mag hydroxide-simeth, benzonatate, colchicine, HYDROmorphone (DILAUDID) injection, iohexol, magnesium hydroxide, metolazone, ondansetron **OR** ondansetron (ZOFRAN) IV, oxyCODONE, traMADol  Assessment/ Plan:  59 y.o. male with past medical history of diabetes mellitus type 2, coronary artery disease status post CABG, deceased donor renal transplant 09/13/2010, gastric sleeve surgery 03/14/2014, hypertension, gout, COVID-19 infection who presents now with acute chest pain.   1.  Acute kidney injury/chronic kidney disease stage IIIa/renal transplant status.  Acute kidney injury and azotemia likely now related to diuretics and steroids.  Obtain renal transplant ultrasound to make sure there is no underlying obstruction.  We have discontinued torsemide.  Start the patient on IV fluid hydration with 0.9 normal  saline at 75 cc/h.  Tacrolimus increased to 2 mg p.o. twice daily which is his home dose.  Maintain the patient on current dosage of CellCept as well.  2.  Anemia chronic kidney disease.  Hemoglobin currently 11.0.  No indication for Procrit.  3.  Gout.  Patient has been started on low-dose allopurinol.  Follow uric acid closely.  If it does not decline consider using Uloric instead.    LOS: 4 [name] 4/23/20211:42 PM

## 2019-07-16 NOTE — Progress Notes (Signed)
PROGRESS NOTE    [name].  IWP:[id number] DOB: [birth year] DOA: 07/12/2019 PCP: [name], MD    Brief Narrative:  Briefly, patient is a 59 year old man with multiple medical problems including status post renal transplantation in 2012 presently on Prograf, HFpEF, atrial fibrillation on Eliquis, CAD status post CABG in 2005 denies MI and pulmonary artery hypertension who was admitted 06/30/2019-07/04/2019 for COVID pneumonia. Patient had received first Covid vaccine 5 weeks previously, he had lost his mother to Covid earlier this year. Now readmitted for acute onset of left arm anterior chest wall pain.   While in the emergency room, Left upper extremity venous Doppler revealed occluded cephalic vein at the AV fistula outflow tract in the antecubital fossa with no other thrombus identified. Chest x-ray showed Central pulmonary vascular congestion without definite pulmonary edema or consolidative process.  4/21: Still with severe pain in the left arm and decreased range of motion.  Also with pain and decreased range of motion of bilateral ankles and bilateral knees.  Not responding to narcotic therapy yesterday.  4/22: Patient seen and examined.  Range of motion of left arm and bilateral lower extremities has improved.  Appears to be responding to the steroid narcotic therapy.  Patient was upset about his needs not being met.  Had a lengthy conversation.  Informed current bedside RN.  Questions answered to patient satisfaction.  Pending formal rheumatology evaluation.  4/23: Patient seen and examined.  Evaluated by rheum yesterday.  Joint aspirated, crystal studies consistent with acute gout and pseudo-gout.  Intra-articular steroids administered.  Patient clinically improving, ROM and pain improved.  Case d/w rheum.   Assessment & Plan:   Active Problems:   AV fistula occlusion (HCC)  Polyarticular arthropathy History of RA History of gout Acute gout flare Case d/w  Dr. Jefm Bryant Knee joint aspirated Crystals consistent with acute gout Intra-articular steroids administered Plan: DC solumedrol IV Start prednisone 31m QD, taper steroids over 1 week PT/OT: rec SNF, patient prefers home with home health TOC aware, home health orders placed Ensure downtrend of inflammatory markers [facility]  Status post renal transplant/CKD 3A Creatinine slowly uptrending over interval Plan Continue current IS regimen Daily tac levels Nephrology consulted, pending formal recs  Left AV fistula occlusion with cephalic vein thrombosis on Eliquis with acute respiratory failurewith hypoxia of unclear etiology, rule out acute pulmonary embolism. -swtich IV heparin drip back to Eliquis as no vascular intervention planned -Pain management  -Vascular surgery input appreciated - no intervention planned as patient doesn't use LUE dialysis access and to avoid further kidney injury considering transplant status -O2 protocol will be followed. -VQ scan negative for PE Continue home p.o. diuretics  Gout. -continue allopurinol. Dose changed to 567mQD - See above management of polyarticular arthropathy  Type 2 diabetes mellitus. -SSI and lantus for now. DM nurse c/s  Dyslipidemia. -continue statin therapy.  BPH. -continue Flomax.  Heart failure reduced ejection fraction - EF 45-50% Patient follows his dry weight very carefully and knows how to tailor his diuretics to weight. Follows with [facility] cardio. Will consider c/s if need  Permanent Atrial fibrillation Patient does not appear to be on any rate control medications per med rec however heart rate is low normal. Continue Eliquis  CAD Patient has remote history of CABG, denies history of MI in past Patient does not appear to be on a beta-blocker on admission, possibly secondary to baseline bradycardia I do not see any antiplatelet agents on  med rec, this can be addressed as  an outpatient as warranted.  Depression Continue Wellbutrin  Hypertension. -continue Norvasc.   DVT prophylaxis: Eliquis Code Status: Full Family Communication: Wife Mateo Flow [phone] on 4/23 Disposition Plan: Status is: Inpatient  Status is: Inpatient  Remains inpatient appropriate because:Inpatient level of care appropriate due to severity of illness   Dispo: The patient is from: Home              Anticipated d/c is to: Home              Anticipated d/c date is: 2 days              Patient currently is not medically stable to d/c.  ROM and pain improving, not yet at goal.  Kidney function worsening over interval.  Pending nephrology evaluation        Still with significant pain and limited ROM.  Improving over interval but not at goal.       Consultants:   ID  Vascular sx  Rheumatology  Nephrology  Procedures:   none  Antimicrobials:   none   Subjective: Seen and examined Joint pain improved Joint studies consistent with acute gout   Objective: Vitals:   07/14/19 1935 07/15/19 0058 07/15/19 1609 07/16/19 0922  BP:  (!) 157/78 110/60 (!) 142/78  Pulse:  88 83 74  Resp:  16 (!) 22 17  Temp:  97.9 F (36.6 C) 98.2 F (36.8 C) 98.9 F (37.2 C)  TempSrc:  Oral Oral Oral  SpO2: 97% 97% 91% 93%  Weight:      Height:        Intake/Output Summary (Last 24 hours) at 07/16/2019 1031 Last data filed at 07/16/2019 0900 Gross per 24 hour  Intake 720 ml  Output 826 ml  Net -106 ml   Filed Weights   07/12/19 1229 07/14/19 0502  Weight: 110.7 kg 111.9 kg    Examination:  General exam: Appears calm and comfortable  Respiratory system: Clear to auscultation. Respiratory effort normal. Cardiovascular system: S1 & S2 heard, RRR. No JVD, murmurs, rubs, gallops or clicks. No pedal edema. Gastrointestinal system: Abdomen is nondistended, soft and nontender. No organomegaly or masses felt. Normal bowel sounds heard. Central nervous system:  Alert and oriented. No focal neurological deficits. Extremities: Left upper extremity and bilateral lower extremity tender to touch, decreased range of motion, pain on exertion Skin: No rashes, lesions or ulcers Psychiatry: Judgement and insight appear normal. Mood & affect appropriate.     Data Reviewed: I have personally reviewed following labs and imaging studies  CBC: Recent Labs  Lab 07/12/19 1230 07/13/19 0432 07/14/19 0424 07/15/19 0615 07/16/19 0639  WBC 8.5 7.9 11.5* 7.5 7.8  NEUTROABS  --   --   --  6.6 6.9  HGB 11.5* 11.3* 10.2* 11.0* 11.2*  HCT 37.3* 36.6* 32.4* 35.2* 35.4*  MCV 80.7 81.0 79.8* 80.4 78.8*  PLT 148* 141* 139* 152 150   Basic Metabolic Panel: Recent Labs  Lab 07/12/19 1230 07/14/19 0424 07/15/19 0615 07/16/19 0639  NA 136 139 135 137  K 5.3* 3.7 3.9 4.0  CL 98 96* 94* 95*  CO2 _0 GLUCOSE 202* 124* 358* 273*  BUN 23* 31* 55* 93*  CREATININE 1.40* 1.88* 1.98*  1.89* 2.10*  CALCIUM 9.2 8.4* 8.8* 9.3   GFR: Estimated Creatinine Clearance: 47.8 mL/min (A) (by C-G formula based on SCr of 2.1 mg/dL (H)). Liver Function Tests: No results for  input(s): AST, ALT, ALKPHOS, BILITOT, PROT, ALBUMIN in the last 168 hours. No results for input(s): LIPASE, AMYLASE in the last 168 hours. No results for input(s): AMMONIA in the last 168 hours. Coagulation Profile: Recent Labs  Lab 07/12/19 1230  INR 1.8*   Cardiac Enzymes: No results for input(s): CKTOTAL, CKMB, CKMBINDEX, TROPONINI in the last 168 hours. BNP (last 3 results) No results for input(s): PROBNP in the last 8760 hours. HbA1C: No results for input(s): HGBA1C in the last 72 hours. CBG: Recent Labs  Lab 07/15/19 1927 07/15/19 2139 07/16/19 0041 07/16/19 0424 07/16/19 0920  GLUCAP 382* 414* 390* 303* 214*   Lipid Profile: No results for input(s): CHOL, HDL, LDLCALC, TRIG, CHOLHDL, LDLDIRECT in the last 72 hours. Thyroid Function Tests: No results for input(s): TSH,  T4TOTAL, FREET4, T3FREE, THYROIDAB in the last 72 hours. Anemia Panel: No results for input(s): VITAMINB12, FOLATE, FERRITIN, TIBC, IRON, RETICCTPCT in the last 72 hours. Sepsis Labs: No results for input(s): PROCALCITON, LATICACIDVEN in the last 168 hours.  Recent Results (from the past 240 hour(s))  CULTURE, BLOOD (ROUTINE X 2) w Reflex to ID Panel     Status: None (Preliminary result)   Collection Time: 07/13/19  7:07 PM   Specimen: BLOOD  Result Value Ref Range Status   Specimen Description BLOOD BLOOD RIGHT HAND  Final   Special Requests   Final    BOTTLES DRAWN AEROBIC AND ANAEROBIC Blood Culture adequate volume   Culture   Final    NO GROWTH 3 DAYS Performed at [facility], [address] [place], [place] [address]    Report Status PENDING  Incomplete  CULTURE, BLOOD (ROUTINE X 2) w Reflex to ID Panel     Status: None (Preliminary result)   Collection Time: 07/13/19  7:08 PM   Specimen: BLOOD  Result Value Ref Range Status   Specimen Description BLOOD RIGHT ANTECUBITAL  Final   Special Requests   Final    BOTTLES DRAWN AEROBIC AND ANAEROBIC Blood Culture adequate volume   Culture   Final    NO GROWTH 3 DAYS Performed at [facility], [address], [place], [place] [address]    Report Status PENDING  Incomplete         Radiology Studies: DG Elbow 2 Views Left  Result Date: 07/14/2019 CLINICAL DATA:  Rheumatoid arthritis, polyarthralgia, no known injury EXAM: LEFT ELBOW - 2 VIEW COMPARISON:  None. FINDINGS: There is no evidence of fracture, dislocation, or joint effusion. There is no evidence of arthropathy or other focal bone abnormality. Vascular calcinosis and surgical clips. IMPRESSION: No fracture or dislocation of the left elbow. Joint spaces are preserved. No elbow joint effusion. Electronically Signed   By: Eddie Candle M.D.   On: 07/14/2019 12:45   DG Wrist 2 Views Left  Result Date: 07/14/2019 CLINICAL DATA:  Rheumatoid arthritis,  polyarthralgia, no known injury EXAM: LEFT WRIST - 2 VIEW COMPARISON:  None. FINDINGS: There is no evidence of fracture or dislocation. There is no evidence of arthropathy or other focal bone abnormality. Surgical clips about the radial wrist. IMPRESSION: No fracture or dislocation of the left wrist. The carpus is normally aligned. Joint spaces are preserved. Electronically Signed   By: Eddie Candle M.D.   On: 07/14/2019 12:44   DG Knee 1-2 Views Left  Result Date: 07/14/2019 CLINICAL DATA:  Rheumatoid arthritis, polyarthralgia, no known injury EXAM: LEFT KNEE - 1-2 VIEW COMPARISON:  None. FINDINGS: No evidence of fracture, dislocation, or joint  effusion. No evidence of arthropathy or other focal bone abnormality. Vascular calcinosis. IMPRESSION: No fracture or dislocation of the left knee. Joint spaces are preserved. Electronically Signed   By: Eddie Candle M.D.   On: 07/14/2019 12:51   DG Knee 1-2 Views Right  Result Date: 07/14/2019 CLINICAL DATA:  Rheumatoid arthritis, polyarthralgia, no known injury EXAM: RIGHT KNEE - 1-2 VIEW COMPARISON:  None. FINDINGS: No evidence of fracture or dislocation. Probable knee joint effusion. No evidence of arthropathy or other focal bone abnormality. Vascular calcinosis. Surgical clips about the medial knee. IMPRESSION: 1. No fracture or dislocation of the right knee. Joint spaces are preserved. 2.  Probable nonspecific knee joint effusion. Electronically Signed   By: Eddie Candle M.D.   On: 07/14/2019 12:50   DG Ankle 2 Views Left  Result Date: 07/14/2019 CLINICAL DATA:  Rheumatoid arthritis, polyarthralgia, no known injury EXAM: LEFT ANKLE - 2 VIEW COMPARISON:  None. FINDINGS: There is no evidence of fracture, dislocation, or joint effusion. There is no evidence of arthropathy or other focal bone abnormality. Vascular calcinosis. IMPRESSION: No fracture or dislocation of the left ankle. Joint spaces are preserved. Electronically Signed   By: Eddie Candle M.D.   On:  07/14/2019 12:48   DG Ankle 2 Views Right  Result Date: 07/14/2019 CLINICAL DATA:  Rheumatoid arthritis, polyarthralgia, no known injury EXAM: RIGHT ANKLE - 2 VIEW COMPARISON:  None. FINDINGS: No fracture or dislocation of the right ankle. There may be periarticular erosions and subchondral cystic change about the ankle mortise and tibiofibular articulation, not ideally evaluated although most conspicuous on lateral view. Vascular calcinosis. IMPRESSION: 1. No fracture or dislocation of the right ankle. 2. There may be periarticular erosions and subchondral cystic change about the ankle mortise and tibiofibular articulation, not ideally evaluated although most conspicuous on lateral view. MRI may be used to further evaluate for inflammatory synovitis/periarthritis and degenerative change if desired. Electronically Signed   By: Eddie Candle M.D.   On: 07/14/2019 12:47   DG Shoulder Left  Result Date: 07/14/2019 CLINICAL DATA:  Rheumatoid arthritis, polyarthritis, no known injury, COVID EXAM: LEFT SHOULDER - 2+ VIEW COMPARISON:  None. FINDINGS: There is no evidence of fracture or dislocation. There is no evidence of arthropathy or other focal bone abnormality. Soft tissues are unremarkable. IMPRESSION: No fracture or dislocation of the left shoulder. Joint spaces are preserved. Electronically Signed   By: Eddie Candle M.D.   On: 07/14/2019 12:43        Scheduled Meds: . allopurinol  50 mg Oral Daily  . amLODipine  5 mg Oral Daily  . apixaban  5 mg Oral BID  . vitamin C  250 mg Oral Daily  . budesonide  2 puff Inhalation BID  . buPROPion  150 mg Oral BID  . ferrous sulfate  650 mg Oral BID  . gabapentin  300 mg Oral QHS  . insulin aspart  0-20 Units Subcutaneous TID WC  . insulin aspart  5 Units Subcutaneous TID WC  . insulin glargine  20 Units Subcutaneous QHS  . mouth rinse  15 mL Mouth Rinse BID  . montelukast  10 mg Oral QHS  . multivitamin with minerals  1 tablet Oral Daily  .  mycophenolate  360 mg Oral BID  . pantoprazole  40 mg Oral Daily  . predniSONE  60 mg Oral Q breakfast  . rosuvastatin  10 mg Oral Daily  . tacrolimus  1 mg Oral BID  . tamsulosin  0.4 mg  Oral Daily  . torsemide  80 mg Oral q AM   And  . torsemide  40 mg Oral QPM  . zinc sulfate  220 mg Oral BID   Continuous Infusions: . sodium chloride Stopped (07/14/19 0429)     LOS: 4 days    Time spent: 35 min    Sidney Ace, MD [facility] Pager [phone]  If 7PM-7AM, please contact night-coverage 07/16/2019, 10:31 AM

## 2019-07-16 NOTE — TOC Progression Note (Signed)
Transition of Care (TOC) - Progression Note    Patient Details  Name: [name]. MRN: [id number] Date of Birth: 1960/06/18  Transition of Care [facility]) CM/SW Contact  Shelbie Hutching, RN Phone Number: 07/16/2019, 10:27 AM  Clinical Narrative:     Patient would like to go home with home health.  Patient expresses that rehab would be a good idea but if he can't have visitors then he prefers to go home.  Home Health referral given to Floydene Flock with [place] for RN, PT, and OT.  Orders also placed for 3 in 1 and rolling walker.    Expected Discharge Plan: [place] Barriers to Discharge: Continued Medical Work up  Expected Discharge Plan and Services Expected Discharge Plan: [place]   Discharge Planning Services: CM Consult Post Acute Care Choice: Home Health, Durable Medical Equipment Living arrangements for the past 2 months: [place]                 DME Arranged: 3-N-1, Walker rolling DME Agency: [employer] Date DME Agency Contacted: 07/16/19 Time DME Agency Contacted: [id number] Representative spoke with at DME Agency: [place]: RN, PT, OT [facility] Agency: [place] ([place]) Date [place]: 07/16/19 Time [place]: 1022 Representative spoke with at [place]: [place] (SDOH) Interventions    Readmission Risk Interventions Readmission Risk Prevention Plan 07/15/2019 07/02/2019 05/06/2019  Transportation Screening Complete Complete Complete  HRI or [place] - - Complete  Social Work Consult for [place] Planning/Counseling - - Complete  Palliative Care Screening - - Not Applicable  Medication Review Press photographer) Complete Complete Complete  PCP or Specialist appointment within 3-5 days of discharge Complete Complete -  SW Recovery Care/Counseling Consult Complete - -  Palliative Care Screening Not Applicable Not  Applicable -  [place] Not Applicable Not Applicable -  Some recent data might be hidden

## 2019-07-16 NOTE — Progress Notes (Addendum)
Inpatient Diabetes Program Recommendations  AACE/ADA: New Consensus Statement on Inpatient Glycemic Control (2015)  Target Ranges:  Prepandial:   less than 140 mg/dL      Peak postprandial:   less than 180 mg/dL (1-2 hours)      Critically ill patients:  140 - 180 mg/dL   Results for CRESPIN, FORSTROM (MRN [id number]) as of 07/16/2019 07:17  Ref. Range 07/15/2019 00:29 07/15/2019 07:36 07/15/2019 11:24 07/15/2019 16:24 07/15/2019 17:54 07/15/2019 19:27 07/15/2019 21:39  Glucose-Capillary Latest Ref Range: 70 - 99 mg/dL 318 (H) 322 (H)  11 units NOVOLOG given at 10:15am 376 (H)  15 units NOVOLOG given at 2:06pm 419 (H) 430 (H)  20 units NOVOLOG given at 5:13pm +   15 units NOVOLOG given at 6:23pm 382 (H) 414 (H)  20 units NOVOLOG +  12 units LANTUS   Results for KEMONTAE, DUNKLEE (MRN [id number]) as of 07/16/2019 07:17  Ref. Range 07/16/2019 00:41 07/16/2019 04:24  Glucose-Capillary Latest Ref Range: 70 - 99 mg/dL 390 (H)  20 units NOVOLOG  303 (H)  15 units NOVOLOG      Endocrinologist: Dr. Gabriel Carina with [facility]--last seen 04/06/2019-- Was told to:  Increase Lantus to 28 QHS Take Farxiga 10 mg QD Take Humalog 5 units TID  Take Humalog 2 units for every 50 mg/dl >CBG of 150   Current Orders: Lantus 12 units QHS      Novolog Resistant Correction Scale/ SSI (0-20 units) Q4 hours    Solumedrol reduced to 20 mg Q8H last PM--Novolog increased to 0-20 unit scale last PM     MD- Please consider:  1. Increase Lantus to 20 units QHS (~80% total home dose)  2. Change Novolog SSI timing to TID AC + HS  3. Start Novolog Meal Coverage: Novolog 5 units TID with meals (home dose)  (Please add the following Hold Parameters: Hold if pt eats <50% of meal, Hold if pt NPO)     --Will follow patient during hospitalization--  Wyn Quaker RN, MSN, CDE Diabetes Coordinator Inpatient Glycemic Control Team Team Pager: [phone] (8a-5p)

## 2019-07-16 NOTE — Progress Notes (Addendum)
NP Randol Kern made aware of BS, new orders placed, per Randol Kern give 20units novolog

## 2019-07-17 DIAGNOSIS — N170 Acute kidney failure with tubular necrosis: Secondary | ICD-10-CM | POA: Diagnosis not present

## 2019-07-17 DIAGNOSIS — J9601 Acute respiratory failure with hypoxia: Secondary | ICD-10-CM | POA: Diagnosis not present

## 2019-07-17 DIAGNOSIS — Z940 Kidney transplant status: Secondary | ICD-10-CM | POA: Diagnosis not present

## 2019-07-17 DIAGNOSIS — T8619 Other complication of kidney transplant: Secondary | ICD-10-CM | POA: Diagnosis not present

## 2019-07-17 DIAGNOSIS — T82898A Other specified complication of vascular prosthetic devices, implants and grafts, initial encounter: Secondary | ICD-10-CM | POA: Diagnosis not present

## 2019-07-17 DIAGNOSIS — I130 Hypertensive heart and chronic kidney disease with heart failure and stage 1 through stage 4 chronic kidney disease, or unspecified chronic kidney disease: Secondary | ICD-10-CM | POA: Diagnosis not present

## 2019-07-17 DIAGNOSIS — I4821 Permanent atrial fibrillation: Secondary | ICD-10-CM | POA: Diagnosis not present

## 2019-07-17 DIAGNOSIS — I5032 Chronic diastolic (congestive) heart failure: Secondary | ICD-10-CM | POA: Diagnosis not present

## 2019-07-17 DIAGNOSIS — Z8616 Personal history of COVID-19: Secondary | ICD-10-CM | POA: Diagnosis not present

## 2019-07-17 LAB — CBC WITH DIFFERENTIAL/PLATELET
Abs Immature Granulocytes: 0.03 10*3/uL (ref 0.00–0.07)
Basophils Absolute: 0 10*3/uL (ref 0.0–0.1)
Basophils Relative: 0 %
Eosinophils Absolute: 0 10*3/uL (ref 0.0–0.5)
Eosinophils Relative: 0 %
HCT: 33.9 % — ABNORMAL LOW (ref 39.0–52.0)
Hemoglobin: 10.9 g/dL — ABNORMAL LOW (ref 13.0–17.0)
Immature Granulocytes: 1 %
Lymphocytes Relative: 9 %
Lymphs Abs: 0.5 10*3/uL — ABNORMAL LOW (ref 0.7–4.0)
MCH: 24.9 pg — ABNORMAL LOW (ref 26.0–34.0)
MCHC: 32.2 g/dL (ref 30.0–36.0)
MCV: 77.4 fL — ABNORMAL LOW (ref 80.0–100.0)
Monocytes Absolute: 0.2 10*3/uL (ref 0.1–1.0)
Monocytes Relative: 4 %
Neutro Abs: 4.8 10*3/uL (ref 1.7–7.7)
Neutrophils Relative %: 86 %
Platelets: 165 10*3/uL (ref 150–400)
RBC: 4.38 MIL/uL (ref 4.22–5.81)
RDW: 18.1 % — ABNORMAL HIGH (ref 11.5–15.5)
WBC: 5.6 10*3/uL (ref 4.0–10.5)
nRBC: 0 % (ref 0.0–0.2)

## 2019-07-17 LAB — GLUCOSE, CAPILLARY
Glucose-Capillary: 298 mg/dL — ABNORMAL HIGH (ref 70–99)
Glucose-Capillary: 304 mg/dL — ABNORMAL HIGH (ref 70–99)
Glucose-Capillary: 299 mg/dL — ABNORMAL HIGH (ref 70–99)
Glucose-Capillary: 340 mg/dL — ABNORMAL HIGH (ref 70–99)

## 2019-07-17 LAB — BASIC METABOLIC PANEL
Anion gap: 10 (ref 5–15)
BUN: 97 mg/dL — ABNORMAL HIGH (ref 6–20)
CO2: 29 mmol/L (ref 22–32)
Calcium: 9.1 mg/dL (ref 8.9–10.3)
Chloride: 93 mmol/L — ABNORMAL LOW (ref 98–111)
Creatinine, Ser: 1.95 mg/dL — ABNORMAL HIGH (ref 0.61–1.24)
GFR calc Af Amer: 42 mL/min — ABNORMAL LOW (ref >60)
GFR calc non Af Amer: 37 mL/min — ABNORMAL LOW (ref >60)
Glucose, Bld: 338 mg/dL — ABNORMAL HIGH (ref 70–99)
Potassium: 4.1 mmol/L (ref 3.5–5.1)
Sodium: 132 mmol/L — ABNORMAL LOW (ref 135–145)

## 2019-07-17 LAB — TACROLIMUS LEVEL: Tacrolimus (FK506) - LabCorp: 17.8 ng/mL (ref 2.0–20.0)

## 2019-07-17 MED ORDER — INSULIN ASPART 100 UNIT/ML ~~LOC~~ SOLN
7.0000 [IU] | Freq: Three times a day (TID) | SUBCUTANEOUS | Status: DC
  Administered 2019-07-17 – 2019-07-18 (×4): 7 [IU] via SUBCUTANEOUS
  Filled 2019-07-17 (×4): qty 1

## 2019-07-17 MED ORDER — PREDNISONE 50 MG PO TABS
50.0000 mg | ORAL_TABLET | Freq: Every day | ORAL | Status: AC
  Administered 2019-07-17: 13:00:00 50 mg via ORAL
  Filled 2019-07-17: qty 1

## 2019-07-17 MED ORDER — INSULIN GLARGINE 100 UNIT/ML ~~LOC~~ SOLN
28.0000 [IU] | Freq: Every day | SUBCUTANEOUS | Status: DC
  Administered 2019-07-17: 21:00:00 28 [IU] via SUBCUTANEOUS
  Filled 2019-07-17 (×2): qty 0.28

## 2019-07-17 NOTE — Progress Notes (Signed)
[name].  MRN: [id number]  DOB/AGE: May 09, 1960 59 y.o.  Primary Care Physician:Letvak, Theophilus Kinds, MD  Admit date: 07/12/2019  Chief Complaint:  Chief Complaint  Patient presents with  . Weakness    S-Pt presented on  07/12/2019 with  Chief Complaint  Patient presents with  . Weakness  . Patient states " I feel much better than what I came in with." Patient is" my weakness is now much better"  Medications . allopurinol  50 mg Oral Daily  . amLODipine  5 mg Oral Daily  . apixaban  5 mg Oral BID  . vitamin C  250 mg Oral Daily  . budesonide  2 puff Inhalation BID  . buPROPion  150 mg Oral BID  . ferrous sulfate  650 mg Oral BID  . gabapentin  300 mg Oral QHS  . insulin aspart  0-20 Units Subcutaneous TID AC & HS  . insulin aspart  7 Units Subcutaneous TID WC  . insulin glargine  28 Units Subcutaneous QHS  . mouth rinse  15 mL Mouth Rinse BID  . montelukast  10 mg Oral QHS  . multivitamin with minerals  1 tablet Oral Daily  . mycophenolate  360 mg Oral BID  . pantoprazole  40 mg Oral Daily  . predniSONE  50 mg Oral Daily  . rosuvastatin  10 mg Oral Daily  . tacrolimus  2 mg Oral BID  . tamsulosin  0.4 mg Oral Daily  . zinc sulfate  220 mg Oral BID         EGB:TDVVO from the symptoms mentioned above,there are no other symptoms referable to all systems reviewed.  Physical Exam: Vital signs in last 24 hours: Temp:  [97.7 F (36.5 C)-98.1 F (36.7 C)] 98.1 F (36.7 C) (04/24 0915) Pulse Rate:  [62-79] 66 (04/24 0915) Resp:  [16-20] 18 (04/24 0915) BP: (142-167)/(64-82) 167/82 (04/24 0915) SpO2:  [86 %-98 %] 96 % (04/24 0915) Weight change:  Last BM Date: 07/16/19  Intake/Output from previous day: 04/23 0701 - 04/24 0700 In: 1286.8 [I.V.:1286.8] Out: 1100 [Urine:1100] No intake/output data recorded.   Physical Exam: General- pt is awake,alert, oriented to time place and person Resp- No acute REsp distress, CTA B/L NO Rhonchi CVS- S1S2  regular in rate and rhythm GIT- BS+, soft, NT, ND EXT- NO LE Edema, Cyanosis Access patient has left AV fistula which is positive bruit and thrill  Lab Results: CBC Recent Labs    07/16/19 0639 07/17/19 0741  WBC 7.8 5.6  HGB 11.2* 10.9*  HCT 35.4* 33.9*  PLT 189 165    BMET Recent Labs    07/16/19 0639 07/17/19 0741  NA 137 132*  K 4.0 4.1  CL 95* 93*  CO2 31 29  GLUCOSE 273* 338*  BUN 93* 97*  CREATININE 2.10* 1.95*  CALCIUM 9.3 9.1    Creatinine trend 2021 1.9--2.1 1.7--1.9 during earlier March admission 1.5--1.9 during February admission 2020 1.3--1.5 2019 1.0--1.2 2018 1.2--1.5  MICRO Recent Results (from the past 240 hour(s))  CULTURE, BLOOD (ROUTINE X 2) w Reflex to ID Panel     Status: None (Preliminary result)   Collection Time: 07/13/19  7:07 PM   Specimen: BLOOD  Result Value Ref Range Status   Specimen Description BLOOD BLOOD RIGHT HAND  Final   Special Requests   Final    BOTTLES DRAWN AEROBIC AND ANAEROBIC Blood Culture adequate volume   Culture   Final    NO GROWTH 4 DAYS Performed  at [facility], [address] [name], [place] [address]    Report Status PENDING  Incomplete  CULTURE, BLOOD (ROUTINE X 2) w Reflex to ID Panel     Status: None (Preliminary result)   Collection Time: 07/13/19  7:08 PM   Specimen: BLOOD  Result Value Ref Range Status   Specimen Description BLOOD RIGHT ANTECUBITAL  Final   Special Requests   Final    BOTTLES DRAWN AEROBIC AND ANAEROBIC Blood Culture adequate volume   Culture   Final    NO GROWTH 4 DAYS Performed at [facility], [address], [place], [place] [address]    Report Status PENDING  Incomplete      Lab Results  Component Value Date   CALCIUM 9.1 07/17/2019   CAION 1.29 05/03/2019   PHOS 3.3 07/04/2019               Impression:  Mr. 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 Patient is s/p deceased donor renal transplant done on September 13, 2010. Patient CKD progression has been marked with multiple episodes of AKI during past few months Patient is on triple immunosuppression regimen Patient is on Myfortic 360 mg p.o. twice daily Prograf 2 mg p.o. twice daily Prednisone 60 mg p.o. daily  Tacrolimus level high at 17.8 on 4/22-this is most likely secondary to the draw time as it was drawn at 902 after patient's morning dose We will recheck patient's labs   High BUN most likely secondary to diuresis and steroids   2)HTN Patient blood pressure is stable  3)Anemia of chronic disease  HGb at goal (9--11)   4) gout Patient is now on allopurinol  5) left arm pain Patient was recently seen by rheumatology had a joint aspiration done. Patient is now diagnosed with acute gout and pseudogout. Patient is s/p intra-articular steroid Patient is now clinically better   6) electrolytes   sodium Hyponatremic secondary to hyperglycemia   potassium Normokalemic    7)Acid base Co2 at goal     Plan:  We will continue the IV fluids We will continue to follow Chem-7 We will continue the current immunosuppressive regimen     [name] s [facility] Oter[facility] Lopez 07/17/2019, 12:08 PM

## 2019-07-17 NOTE — Progress Notes (Signed)
PROGRESS NOTE    [name].  UKG:[id number] DOB: 03-18-1961 DOA: 07/12/2019 PCP: [name], MD    Brief Narrative:  Briefly, patient is a 59 year old man with multiple medical problems including status post renal transplantation in 2012 presently on Prograf, HFpEF, atrial fibrillation on Eliquis, CAD status post CABG in 2005 denies MI and pulmonary artery hypertension who was admitted 06/30/2019-07/04/2019 for COVID pneumonia. Patient had received first Covid vaccine 5 weeks previously, he had lost his mother to Covid earlier this year. Now readmitted for acute onset of left arm anterior chest wall pain.   While in the emergency room, Left upper extremity venous Doppler revealed occluded cephalic vein at the AV fistula outflow tract in the antecubital fossa with no other thrombus identified. Chest x-ray showed Central pulmonary vascular congestion without definite pulmonary edema or consolidative process.  4/21: Still with severe pain in the left arm and decreased range of motion.  Also with pain and decreased range of motion of bilateral ankles and bilateral knees.  Not responding to narcotic therapy yesterday.  4/22: Patient seen and examined.  Range of motion of left arm and bilateral lower extremities has improved.  Appears to be responding to the steroid narcotic therapy.  Patient was upset about his needs not being met.  Had a lengthy conversation.  Informed current bedside RN.  Questions answered to patient satisfaction.  Pending formal rheumatology evaluation.  4/23: Patient seen and examined.  Evaluated by rheum yesterday.  Joint aspirated, crystal studies consistent with acute gout and pseudo-gout.  Intra-articular steroids administered.  Patient clinically improving, ROM and pain improved.  Case d/w rheum.  4/24: Patient seen and examined.  Continues to improve.  Pain improving.  Seen by nephrology yesterday.  Diuretics held.  Started on IV fluids.  Kidney function  improving over interval.   Assessment & Plan:   Active Problems:   AV fistula occlusion (HCC)  Polyarticular arthropathy History of RA History of gout Acute gout flare Case d/w Dr. Jefm Bryant Knee joint aspirated Crystals consistent with acute gout Intra-articular steroids administered Plan: Oral prednisone taper, 50 mg today PT/OT: rec SNF, patient prefers home with home health TOC aware, home health orders placed Ensure downtrend of inflammatory markers Outpatient referral to [facility] rheum clinic Anticipate discharge home with home health on 07/18/2019  Status post renal transplant/CKD 3A Seen by nephrology, recommendations appreciated I-S regimen adjusted based on daily tacrolimus levels Diuretics held Gentle IV fluids Creatinine improving over interval Recheck kidney function in the morning  Left AV fistula occlusion with cephalic vein thrombosis on Eliquis with acute respiratory failurewith hypoxia of unclear etiology, rule out acute pulmonary embolism. -swtich IV heparin drip back to Eliquis as no vascular intervention planned -Pain management  -Vascular surgery input appreciated - no intervention planned as patient doesn't use LUE dialysis access and to avoid further kidney injury considering transplant status -O2 protocol will be followed. -VQ scan negative for PE Home diuretics on hold  Gout. -continue allopurinol. Dose changed to 53m QD - See above management of polyarticular arthropathy  Type 2 diabetes mellitus. -SSI and lantus for now. DM nurse c/s  Dyslipidemia. -continue statin therapy.  BPH. -continue Flomax.  Heart failure reduced ejection fraction - EF 45-50% Patient follows his dry weight very carefully and knows how to tailor his diuretics to weight. Follows with [facility] cardio. Will consider c/s if need  Permanent Atrial fibrillation Patient does not appear to be on any rate control medications per med rec however heart  rate is low  normal. Continue Eliquis  CAD Patient has remote history of CABG, denies history of MI in past Patient does not appear to be on a beta-blocker on admission, possibly secondary to baseline bradycardia I do not see any antiplatelet agents on med rec, this can be addressed as an outpatient as warranted.  Depression Continue Wellbutrin  Hypertension. -continue Norvasc.   DVT prophylaxis: Eliquis Code Status: Full Family Communication: Wife Mateo Flow [phone] on 4/23 Disposition Plan: Status is: Inpatient Status is: Inpatient  Remains inpatient appropriate because:Inpatient level of care appropriate due to severity of illness   Dispo: The patient is from: Home              Anticipated d/c is to: Home              Anticipated d/c date is: 1 day              Patient currently is not medically stable to d/c.   Kidney function improving but not at baseline.  Anticipate medical readiness for discharge on 07/18/2019  Consultants:   ID  Vascular sx  Rheumatology  Nephrology  Procedures:   none  Antimicrobials:   none   Subjective: Seen and examined Joint pain improved ROM improved   Objective: Vitals:   07/17/19 0227 07/17/19 0545 07/17/19 0630 07/17/19 0915  BP:    (!) 167/82  Pulse: 69 69 65 66  Resp: _0 Temp:    98.1 F (36.7 C)  TempSrc:    Oral  SpO2: 98% 97% (!) 86% 96%  Weight:      Height:        Intake/Output Summary (Last 24 hours) at 07/17/2019 1142 Last data filed at 07/17/2019 0643 Gross per 24 hour  Intake 1286.76 ml  Output 700 ml  Net 586.76 ml   Filed Weights   07/12/19 1229 07/14/19 0502  Weight: 110.7 kg 111.9 kg    Examination:  General exam: Appears calm and comfortable  Respiratory system: Clear to auscultation. Respiratory effort normal. Cardiovascular system: S1 & S2 heard, RRR. No JVD, murmurs, rubs, gallops or clicks. No pedal edema. Gastrointestinal system: Abdomen is nondistended, soft and nontender. No  organomegaly or masses felt. Normal bowel sounds heard. Central nervous system: Alert and oriented. No focal neurological deficits. Extremities: Left upper extremity and bilateral lower extremity tender to touch, decreased range of motion, pain on exertion Skin: No rashes, lesions or ulcers Psychiatry: Judgement and insight appear normal. Mood & affect appropriate.     Data Reviewed: I have personally reviewed following labs and imaging studies  CBC: Recent Labs  Lab 07/13/19 0432 07/14/19 0424 07/15/19 0615 07/16/19 0639 07/17/19 0741  WBC 7.9 11.5* 7.5 7.8 5.6  NEUTROABS  --   --  6.6 6.9 4.8  HGB 11.3* 10.2* 11.0* 11.2* 10.9*  HCT 36.6* 32.4* 35.2* 35.4* 33.9*  MCV 81.0 79.8* 80.4 78.8* 77.4*  PLT 141* 139* 152 189 159   Basic Metabolic Panel: Recent Labs  Lab 07/12/19 1230 07/14/19 0424 07/15/19 0615 07/16/19 0639 07/17/19 0741  NA 136 139 135 137 132*  K 5.3* 3.7 3.9 4.0 4.1  CL 98 96* 94* 95* 93*  CO2 _1 GLUCOSE 202* 124* 358* 273* 338*  BUN 23* 31* 55* 93* 97*  CREATININE 1.40* 1.88* 1.98*  1.89* 2.10* 1.95*  CALCIUM 9.2 8.4* 8.8* 9.3 9.1   GFR: Estimated Creatinine Clearance: 51.5 mL/min (A) (by C-G formula based on  SCr of 1.95 mg/dL (H)). Liver Function Tests: No results for input(s): AST, ALT, ALKPHOS, BILITOT, PROT, ALBUMIN in the last 168 hours. No results for input(s): LIPASE, AMYLASE in the last 168 hours. No results for input(s): AMMONIA in the last 168 hours. Coagulation Profile: Recent Labs  Lab 07/12/19 1230  INR 1.8*   Cardiac Enzymes: No results for input(s): CKTOTAL, CKMB, CKMBINDEX, TROPONINI in the last 168 hours. BNP (last 3 results) No results for input(s): PROBNP in the last 8760 hours. HbA1C: No results for input(s): HGBA1C in the last 72 hours. CBG: Recent Labs  Lab 07/16/19 0920 07/16/19 1153 07/16/19 1636 07/16/19 2121 07/17/19 0916  GLUCAP 214* 257* 346* 470* 298*   Lipid Profile: No results for  input(s): CHOL, HDL, LDLCALC, TRIG, CHOLHDL, LDLDIRECT in the last 72 hours. Thyroid Function Tests: No results for input(s): TSH, T4TOTAL, FREET4, T3FREE, THYROIDAB in the last 72 hours. Anemia Panel: No results for input(s): VITAMINB12, FOLATE, FERRITIN, TIBC, IRON, RETICCTPCT in the last 72 hours. Sepsis Labs: No results for input(s): PROCALCITON, LATICACIDVEN in the last 168 hours.  Recent Results (from the past 240 hour(s))  CULTURE, BLOOD (ROUTINE X 2) w Reflex to ID Panel     Status: None (Preliminary result)   Collection Time: 07/13/19  7:07 PM   Specimen: BLOOD  Result Value Ref Range Status   Specimen Description BLOOD BLOOD RIGHT HAND  Final   Special Requests   Final    BOTTLES DRAWN AEROBIC AND ANAEROBIC Blood Culture adequate volume   Culture   Final    NO GROWTH 4 DAYS Performed at [facility], [address] [place], [place] [address]    Report Status PENDING  Incomplete  CULTURE, BLOOD (ROUTINE X 2) w Reflex to ID Panel     Status: None (Preliminary result)   Collection Time: 07/13/19  7:08 PM   Specimen: BLOOD  Result Value Ref Range Status   Specimen Description BLOOD RIGHT ANTECUBITAL  Final   Special Requests   Final    BOTTLES DRAWN AEROBIC AND ANAEROBIC Blood Culture adequate volume   Culture   Final    NO GROWTH 4 DAYS Performed at [facility], [address], [place], [place] [address]    Report Status PENDING  Incomplete         Radiology Studies: US Renal Transplant w/Doppler  Result Date: 07/16/2019 CLINICAL DATA:  Renal transplant right lower quadrant, acute renal failure EXAM: ULTRASOUND OF RENAL TRANSPLANT WITH RENAL DOPPLER ULTRASOUND TECHNIQUE: Ultrasound examination of the renal transplant was performed with gray-scale, color and duplex doppler evaluation. COMPARISON:  None. FINDINGS: Transplant kidney location: RLQ Transplant Kidney: Renal measurements: 13.6 x 7.1 x 8.6 cm = volume: 467m. Normal in size and  parenchymal echogenicity. No evidence of mass or hydronephrosis. No peri-transplant fluid collection seen. Color flow in the main renal artery:  Yes Color flow in the main renal vein:  Yes Duplex Doppler Evaluation: Main Renal Artery Resistive Index: 0.84 Venous waveform in main renal vein:  Present Intrarenal resistive index in upper pole:  0.67 (normal 0.6-0.8; equivocal 0.8-0.9; abnormal >= 0.9) Intrarenal resistive index in lower pole: 0.62 (normal 0.6-0.8; equivocal 0.8-0.9; abnormal >= 0.9) Bladder: Normal for degree of bladder distention. Other findings:  None. IMPRESSION: 1. Unremarkable right lower quadrant transplant kidney. Electronically Signed   By: MRanda NgoM.D.   On: 07/16/2019 19:31        Scheduled Meds: . allopurinol  50 mg Oral Daily  . amLODipine  5 mg Oral Daily  . apixaban  5 mg Oral BID  . vitamin C  250 mg Oral Daily  . budesonide  2 puff Inhalation BID  . buPROPion  150 mg Oral BID  . ferrous sulfate  650 mg Oral BID  . gabapentin  300 mg Oral QHS  . insulin aspart  0-20 Units Subcutaneous TID AC & HS  . insulin aspart  7 Units Subcutaneous TID WC  . insulin glargine  28 Units Subcutaneous QHS  . mouth rinse  15 mL Mouth Rinse BID  . montelukast  10 mg Oral QHS  . multivitamin with minerals  1 tablet Oral Daily  . mycophenolate  360 mg Oral BID  . pantoprazole  40 mg Oral Daily  . predniSONE  50 mg Oral Daily  . rosuvastatin  10 mg Oral Daily  . tacrolimus  2 mg Oral BID  . tamsulosin  0.4 mg Oral Daily  . zinc sulfate  220 mg Oral BID   Continuous Infusions: . sodium chloride Stopped (07/14/19 0429)  . sodium chloride 75 mL/hr at 07/17/19 0643     LOS: 5 days    Time spent: 35 min    Sidney Ace, MD [facility] Pager [phone]  If 7PM-7AM, please contact night-coverage 07/17/2019, 11:42 AM

## 2019-07-18 LAB — TACROLIMUS LEVEL: Tacrolimus (FK506) - LabCorp: 12.4 ng/mL (ref 2.0–20.0)

## 2019-07-18 LAB — BASIC METABOLIC PANEL
Anion gap: 7 (ref 5–15)
BUN: 97 mg/dL — ABNORMAL HIGH (ref 6–20)
CO2: 32 mmol/L (ref 22–32)
Calcium: 9.5 mg/dL (ref 8.9–10.3)
Chloride: 101 mmol/L (ref 98–111)
Creatinine, Ser: 1.41 mg/dL — ABNORMAL HIGH (ref 0.61–1.24)
GFR calc Af Amer: >60 mL/min (ref >60)
GFR calc non Af Amer: 54 mL/min — ABNORMAL LOW (ref >60)
Glucose, Bld: 209 mg/dL — ABNORMAL HIGH (ref 70–99)
Potassium: 4.3 mmol/L (ref 3.5–5.1)
Sodium: 140 mmol/L (ref 135–145)

## 2019-07-18 LAB — CULTURE, BLOOD (ROUTINE X 2)
Culture: NO GROWTH
Culture: NO GROWTH
Special Requests: ADEQUATE
Special Requests: ADEQUATE

## 2019-07-18 LAB — GLUCOSE, CAPILLARY
Glucose-Capillary: 224 mg/dL — ABNORMAL HIGH (ref 70–99)
Glucose-Capillary: 361 mg/dL — ABNORMAL HIGH (ref 70–99)

## 2019-07-18 LAB — MAGNESIUM: Magnesium: 2.6 mg/dL — ABNORMAL HIGH (ref 1.7–2.4)

## 2019-07-18 LAB — PHOSPHORUS: Phosphorus: 3.1 mg/dL (ref 2.5–4.6)

## 2019-07-18 MED ORDER — PREDNISONE 20 MG PO TABS
40.0000 mg | ORAL_TABLET | Freq: Every day | ORAL | Status: DC
  Administered 2019-07-18: 40 mg via ORAL
  Filled 2019-07-18: qty 2

## 2019-07-18 MED ORDER — PREDNISONE 10 MG PO TABS: 30.0000 mg | ORAL_TABLET | Freq: Every day | ORAL | 0 refills | Status: DC

## 2019-07-18 NOTE — TOC Transition Note (Signed)
Transition of Care [facility]) - CM/SW Discharge Note   Patient Details  Name: [name]. MRN: [id number] Date of Birth: 04/10/1960  Transition of Care [facility]) CM/SW Contact:  Boris Sharper, LCSW Phone Number: 07/18/2019, 11:57 AM   Clinical Narrative:    [facility] to follow upon discharge. Notified Corene Cornea of discharge.     Barriers to Discharge: Continued Medical Work up   Patient Goals and CMS Choice   CMS Medicare.gov Compare Post Acute Care list provided to:: Patient Choice offered to / list presented to : Patient  Discharge Placement                       Discharge Plan and Services   Discharge Planning Services: CM Consult Post Acute Care Choice: Home Health, Durable Medical Equipment          DME Arranged: 3-N-1, [place] rolling DME Agency: [employer] Date DME Agency Contacted: 07/16/19 Time DME Agency Contacted: [id number] Representative spoke with at DME Agency: [place]: RN, PT, OT [facility] Agency: [place] ([place]) Date [place]: 07/16/19 Time [employer]: [phone] Representative spoke with at [place]: [place] (SDOH) Interventions     Readmission Risk Interventions Readmission Risk Prevention Plan 07/15/2019 07/02/2019 05/06/2019  Transportation Screening Complete Complete Complete  HRI or Home Care Consult - - Complete  Social Work Consult for [place] Planning/Counseling - - Complete  Palliative Care Screening - - Not Applicable  Medication Review Press photographer) Complete Complete Complete  PCP or Specialist appointment within 3-5 days of discharge Complete Complete -  SW Recovery Care/Counseling Consult Complete - -  Palliative Care Screening Not Applicable Not Applicable -  [place] Not Applicable Not Applicable -  Some recent data might be hidden

## 2019-07-18 NOTE — Discharge Instructions (Signed)
Gout  Gout is a condition that causes painful swelling of the joints. Gout is a type of inflammation of the joints (arthritis). This condition is caused by having too much uric acid in the body. Uric acid is a chemical that forms when the body breaks down substances called purines. Purines are important for building body proteins. When the body has too much uric acid, sharp crystals can form and build up inside the joints. This causes pain and swelling. Gout attacks can happen quickly and may be very painful (acute gout). Over time, the attacks can affect more joints and become more frequent (chronic gout). Gout can also cause uric acid to build up under the skin and inside the kidneys. What are the causes? This condition is caused by too much uric acid in your blood. This can happen because:  Your kidneys do not remove enough uric acid from your blood. This is the most common cause.  Your body makes too much uric acid. This can happen with some cancers and cancer treatments. It can also occur if your body is breaking down too many red blood cells (hemolytic anemia).  You eat too many foods that are high in purines. These foods include organ meats and some seafood. Alcohol, especially beer, is also high in purines. A gout attack may be triggered by trauma or stress. What increases the risk? You are more likely to develop this condition if you:  Have a family history of gout.  Are male and middle-aged.  Are male and have gone through menopause.  Are obese.  Frequently drink alcohol, especially beer.  Are dehydrated.  Lose weight too quickly.  Have an organ transplant.  Have lead poisoning.  Take certain medicines, including aspirin, cyclosporine, diuretics, levodopa, and niacin.  Have kidney disease.  Have a skin condition called psoriasis. What are the signs or symptoms? An attack of acute gout happens quickly. It usually occurs in just one joint. The most common place is  the big toe. Attacks often start at night. Other joints that may be affected include joints of the feet, ankle, knee, fingers, wrist, or elbow. Symptoms of this condition may include:  Severe pain.  Warmth.  Swelling.  Stiffness.  Tenderness. The affected joint may be very painful to touch.  Shiny, red, or purple skin.  Chills and fever. Chronic gout may cause symptoms more frequently. More joints may be involved. You may also have white or yellow lumps (tophi) on your hands or feet or in other areas near your joints. How is this diagnosed? This condition is diagnosed based on your symptoms, medical history, and physical exam. You may have tests, such as:  Blood tests to measure uric acid levels.  Removal of joint fluid with a thin needle (aspiration) to look for uric acid crystals.  X-rays to look for joint damage. How is this treated? Treatment for this condition has two phases: treating an acute attack and preventing future attacks. Acute gout treatment may include medicines to reduce pain and swelling, including:  NSAIDs.  Steroids. These are strong anti-inflammatory medicines that can be taken by mouth (orally) or injected into a joint.  Colchicine. This medicine relieves pain and swelling when it is taken soon after an attack. It can be given by mouth or through an IV. Preventive treatment may include:  Daily use of smaller doses of NSAIDs or colchicine.  Use of a medicine that reduces uric acid levels in your blood.  Changes to your diet. You may  need to see a dietitian about what to eat and drink to prevent gout. Follow these instructions at home: During a gout attack   If directed, put ice on the affected area: ? Put ice in a plastic bag. ? Place a towel between your skin and the bag. ? Leave the ice on for 20 minutes, 2-3 times a day.  Raise (elevate) the affected joint above the level of your heart as often as possible.  Rest the joint as much as possible.  If the affected joint is in your leg, you may be given crutches to use.  Follow instructions from your health care provider about eating or drinking restrictions. Avoiding future gout attacks  Follow a low-purine diet as told by your dietitian or health care provider. Avoid foods and drinks that are high in purines, including liver, kidney, anchovies, asparagus, herring, mushrooms, mussels, and beer.  Maintain a healthy weight or lose weight if you are overweight. If you want to lose weight, talk with your health care provider. It is important that you do not lose weight too quickly.  Start or maintain an exercise program as told by your health care provider. Eating and drinking  Drink enough fluids to keep your urine pale yellow.  If you drink alcohol: ? Limit how much you use to:  0-1 drink a day for women.  0-2 drinks a day for men. ? Be aware of how much alcohol is in your drink. In the [place] one drink equals one 12 oz bottle of beer (355 mL) one 5 oz glass of wine (148 mL), or one 1 oz glass of hard liquor (44 mL). General instructions  Take over-the-counter and prescription medicines only as told by your health care provider.  Do not drive or use heavy machinery while taking prescription pain medicine.  Return to your normal activities as told by your health care provider. Ask your health care provider what activities are safe for you.  Keep all follow-up visits as told by your health care provider. This is important. Contact a health care provider if you have:  Another gout attack.  Continuing symptoms of a gout attack after 10 days of treatment.  Side effects from your medicines.  Chills or a fever.  Burning pain when you urinate.  Pain in your lower back or belly. Get help right away if you:  Have severe or uncontrolled pain.  Cannot urinate. Summary  Gout is painful swelling of the joints caused by inflammation.  The most common site of pain is the big  toe, but it can affect other joints in the body.  Medicines and dietary changes can help to prevent and treat gout attacks. This information is not intended to replace advice given to you by your health care provider. Make sure you discuss any questions you have with your health care provider. Document Revised: 10/01/2017 Document Reviewed: 10/01/2017 Elsevier Patient Education  [place].

## 2019-07-18 NOTE — Discharge Summary (Signed)
Physician Discharge Summary  [name]. TXM:[id number] DOB: 23-Oct-1960 DOA: 07/12/2019  PCP: [name], MD  Admit date: 07/12/2019 Discharge date: 07/18/2019  Admitted From: Home Disposition: Home with home health  Recommendations for Outpatient Follow-up:  1. Follow up with PCP in 1-2 weeks 2. Establish care with rheumatology  Home Health: Yes Equipment/Devices: Yes, 2 L oxygen  discharge Condition: Stable CODE STATUS: Full Diet recommendation: Heart Healthy / Carb Modified  Brief/Interim Summary: patient is a 59 year old man with multiple medical problems including status post renal transplantationin 2012presently on Prograf, HFpEF, atrial fibrillation on Eliquis, CAD status post CABG in 2005 denies MI and pulmonary artery hypertension who was admitted 06/30/2019-07/04/2019 for COVID pneumonia. Patient had received first Covid vaccine5weeks previously, he had lost his mother to Covid earlier this year. Now readmitted foracute onset of left arm anterior chest wall pain.   While inthe emergency room, Left upper extremity venous Doppler revealed occluded cephalic vein at the AV fistula outflow tract in the antecubital fossa with no other thrombus identified. Chest x-ray showed Central pulmonary vascular congestion without definite pulmonary edema or consolidative process.  4/21: Still with severe pain in the left arm and decreased range of motion.  Also with pain and decreased range of motion of bilateral ankles and bilateral knees.  Not responding to narcotic therapy yesterday.  4/22: Patient seen and examined.  Range of motion of left arm and bilateral lower extremities has improved.  Appears to be responding to the steroid narcotic therapy.  Patient was upset about his needs not being met.  Had a lengthy conversation.  Informed current bedside RN.  Questions answered to patient satisfaction.  Pending formal rheumatology evaluation.  4/23: Patient seen and  examined.  Evaluated by rheum yesterday.  Joint aspirated, crystal studies consistent with acute gout and pseudo-gout.  Intra-articular steroids administered.  Patient clinically improving, ROM and pain improved.  Case d/w rheum.  4/24: Patient seen and examined.  Continues to improve.  Pain improving.  Seen by nephrology yesterday.  Diuretics held.  Started on IV fluids.  Kidney function improving over interval.  4/25: Patient seen and examined.  Creatinine back to baseline of 1.4.  IV fluids to be stopped.  Patient stable for discharge home at this time.  Will prescribe rapid taper of prednisone.  Home health ordered.  Home oxygen already obtained.  Discussed with nephrology.  Patient will follow up with his own nephrologist at [facility].  Patient will follow up with Dr. Jefm Bryant from rheumatology.  Discharge Diagnoses:  Active Problems:   AV fistula occlusion (HCC)  Polyarticular arthropathy History of RA History of gout Acute gout flare Case d/w Dr. Jefm Bryant Knee joint aspirated Crystals consistent with acute gout Intra-articular steroids administered Pain improved, range of motion improved PT recommended SNF however patient elected to go home with home health Prescribed rapid prednisone taper on discharge Outpatient referral to [facility] rheumatology clinic Discharged home in stable condition   Status post renal transplant/CKD 3A Seen by nephrology, recommendations appreciated I-S regimen adjusted based on daily tacrolimus levels Diuretics held in house Creatinine returned to baseline with gentle IV fluids Resume home I-S regimen on discharge Follow-up with nephrologist at [facility]   Left AV fistula occlusion with cephalic vein thrombosis on Eliquis with acute respiratory failurewith hypoxia of unclear etiology, rule out acute pulmonary embolism. Seen by vascular surgery, no intervention planned Eliquis resumed can continue on discharge PE ruled out with VQ  scan  Gout. -continue allopurinol. Dose changed  to 63m QD - See above management of polyarticular arthropathy  Type 2 diabetes mellitus. Resume home regimen  Dyslipidemia. -continue statin therapy.  BPH. -continue Flomax.  Heart failure reduced ejection fraction- EF 45-50% Patient follows his dry weight very carefully and knows how to tailor his diuretics to weight. Follows with [facility] cardio.  Outpatient follow-up  PermanentAtrial fibrillation Patient does not appear to be on any rate control medications per med rec however heart rate is low normal. Continue Eliquis  CAD Patient has remote history of CABG, denies history of MI in past Patient does not appear to be on a beta-blocker on admission, possibly secondary to baseline bradycardia I do not see any antiplatelet agents on med rec, this can be addressed as an outpatient as warranted.  Depression Continue Wellbutrin  Hypertension. -continue Norvasc.  Discharge Instructions  Discharge Instructions    Diet - low sodium heart healthy   Complete by: As directed    Increase activity slowly   Complete by: As directed       Follow-up Information    LVenia Carbon MD Follow up in 5 day(s).   Specialties: Internal Medicine, Pediatrics Why: Please follow up with your pcp in 3-5 days.  Contact information: [phone]        MLarey Dresser MD .   Specialty: Cardiology Contact information: [phone]       AWellington Hampshire MD .   Specialty: Cardiology Contact information: [phone]        KEmmaline Kluver, MD. Schedule an appointment as soon as possible for a visit in 2 week(s).   Specialty: Rheumatology Contact information: [phone]          Allergies  Allergen Reactions  . Acetaminophen Other (See Comments)    BC of transplant  . Contrast Media [Iodinated Diagnostic Agents]     Kidney transplant  . Ibuprofen Other (See Comments)    Due to kidney transplant Due to kidney transplant Due to kidney transplant    Consultations:  Vascular surgery  Rheumatology  Nephrology   Procedures/Studies: DG Chest 2 View  Result Date: 07/12/2019 CLINICAL DATA:  Chest pain. EXAM: CHEST - 2 VIEW COMPARISON:  June 30, 2019. FINDINGS: Stable cardiomediastinal silhouette. Status post coronary artery bypass graft. Stable large calcified right paratracheal granuloma or lymph node is noted. No pneumothorax or pleural effusion is noted. Central pulmonary vascular congestion is noted. No definite pulmonary edema or consolidative process is noted. Bony thorax is unremarkable. IMPRESSION: Central pulmonary vascular congestion is noted without definite pulmonary edema or consolidative process. Electronically Signed   By: JMarijo ConceptionM.D.   On: 07/12/2019 13:43   DG Elbow 2 Views Left  Result Date: 07/14/2019 CLINICAL DATA:  Rheumatoid arthritis, polyarthralgia, no known injury EXAM: LEFT ELBOW - 2 VIEW COMPARISON:  None. FINDINGS: There is no evidence of fracture, dislocation, or joint effusion. There is no evidence of arthropathy or other focal bone abnormality. Vascular calcinosis and surgical clips. IMPRESSION: No fracture or dislocation of the left elbow. Joint spaces are preserved. No elbow joint effusion. Electronically Signed   By: AEddie CandleM.D.   On: 07/14/2019 12:45   DG Wrist 2 Views Left  Result Date: 07/14/2019 CLINICAL DATA:  Rheumatoid arthritis, polyarthralgia, no known injury EXAM: LEFT WRIST -  2 VIEW COMPARISON:  None. FINDINGS: There is no evidence of fracture or dislocation. There is no evidence of arthropathy or other focal bone abnormality. Surgical clips about the radial wrist. IMPRESSION: No fracture or dislocation  of the left wrist. The carpus is normally aligned. Joint spaces are preserved. Electronically Signed   By: Eddie Candle M.D.   On: 07/14/2019 12:44   DG Knee 1-2 Views Left  Result Date: 07/14/2019 CLINICAL DATA:  Rheumatoid arthritis, polyarthralgia, no known injury EXAM: LEFT KNEE - 1-2 VIEW COMPARISON:  None. FINDINGS: No evidence of fracture, dislocation, or joint effusion. No evidence of arthropathy or other focal bone abnormality. Vascular calcinosis. IMPRESSION: No fracture or dislocation of the left knee. Joint spaces are preserved. Electronically Signed   By: Eddie Candle M.D.   On: 07/14/2019 12:51   DG Knee 1-2 Views Right  Result Date: 07/14/2019 CLINICAL DATA:  Rheumatoid arthritis, polyarthralgia, no known injury EXAM: RIGHT KNEE - 1-2 VIEW COMPARISON:  None. FINDINGS: No evidence of fracture or dislocation. Probable knee joint effusion. No evidence of arthropathy or other focal bone abnormality. Vascular calcinosis. Surgical clips about the medial knee. IMPRESSION: 1. No fracture or dislocation of the right knee. Joint spaces are preserved. 2.  Probable nonspecific knee joint effusion. Electronically Signed   By: Eddie Candle M.D.   On: 07/14/2019 12:50   DG Ankle 2 Views Left  Result Date: 07/14/2019 CLINICAL DATA:  Rheumatoid arthritis, polyarthralgia, no known injury EXAM: LEFT ANKLE - 2 VIEW COMPARISON:  None. FINDINGS: There is no evidence of fracture, dislocation, or joint effusion. There is no evidence of arthropathy or other focal bone abnormality. Vascular calcinosis. IMPRESSION: No fracture or dislocation of the left ankle. Joint spaces are preserved. Electronically Signed   By: Eddie Candle M.D.   On: 07/14/2019 12:48   DG Ankle 2 Views Right  Result Date: 07/14/2019 CLINICAL DATA:  Rheumatoid arthritis, polyarthralgia, no known injury EXAM: RIGHT ANKLE - 2 VIEW COMPARISON:  None. FINDINGS: No fracture or dislocation of the right ankle. There may be periarticular erosions  and subchondral cystic change about the ankle mortise and tibiofibular articulation, not ideally evaluated although most conspicuous on lateral view. Vascular calcinosis. IMPRESSION: 1. No fracture or dislocation of the right ankle. 2. There may be periarticular erosions and subchondral cystic change about the ankle mortise and tibiofibular articulation, not ideally evaluated although most conspicuous on lateral view. MRI may be used to further evaluate for inflammatory synovitis/periarthritis and degenerative change if desired. Electronically Signed   By: Eddie Candle M.D.   On: 07/14/2019 12:47   CT CHEST WO CONTRAST  Result Date: 07/01/2019 CLINICAL DATA:  Hypoxia.  COVID-19 positive EXAM: CT CHEST WITHOUT CONTRAST TECHNIQUE: Multidetector CT imaging of the chest was performed following the standard protocol without IV contrast. COMPARISON:  January 16, 2019 chest CT; chest radiograph June 30, 2018 FINDINGS: Cardiovascular: There is no thoracic aortic aneurysm. There are scattered foci of aortic atherosclerosis as well as scattered foci of great vessel calcification. Patient is status post coronary artery bypass grafting. There is extensive native coronary artery calcification. There is cardiomegaly with calcification in the mitral valve. There is no pericardial effusion or pericardial thickening. There is prominence of the main pulmonary outflow tract measuring 3.5 cm. Mediastinum/Nodes: Visualized thyroid appears normal. There are multiple calcified lymph nodes consistent with prior granulomatous disease. A right paratracheal lymph node measures 3.1 x 2.8 cm with diffuse calcification, consistent with granulomatous disease change. Other smaller calcified lymph nodes  noted as well. There are multiple noncalcified subcentimeter in size lymph nodes. No noncalcified lymph nodes present meeting size criteria for pathologic significance. Small hiatal hernia present. Lungs/Pleura: There is extensive ground-glass  type opacity throughout the lungs bilaterally with involvement to varying degrees of most lobes in segments. Both upper and lower lobe areas of ground-glass type opacity noted. There is mild consolidation in the posterior segment right upper lobe. No other appreciable consolidation noted. No appreciable pleural effusions. Upper Abdomen: In the visualized upper abdomen, there is evidence of previous gastric surgery without wall thickening. Incomplete visualization of spleen. Spleen measures 17.9 cm from anterior to posterior dimension, enlarged. There are multiple foci of arterial vascular calcification in the upper abdominal aorta and multiple mesenteric arteries. Musculoskeletal: Status post median sternotomy. Multiple foci of degenerative change noted in the lower thoracic spine. No blastic or lytic bone lesions. No evident chest wall lesions. IMPRESSION: 1. Multifocal pneumonia, likely of atypical organism etiology. Mild consolidation posterior segment right upper lobe. Most areas of opacification have a ground-glass type opacity appearance. 2. Evidence of prior granulomatous disease. Enlarged right paratracheal lymph node is calcified consistent with prior granulomatous disease. No frank adenopathy by size criteria involving noncalcified lymph nodes. 3. Aortic atherosclerosis. Multiple foci of native coronary artery calcification with evidence of previous coronary artery bypass grafting. Foci of aortic atherosclerosis as well as calcification in great vessels and multiple mesenteric arterial vessels noted. 4. Enlargement of the main pulmonary outflow tract is indicative of a degree of pulmonary arterial hypertension. 5.  Cardiomegaly.  Calcification of mitral valve. 6. Apparent splenomegaly with spleen incompletely visualized on this study. Aortic Atherosclerosis (ICD10-I70.0). Electronically Signed   By: Lowella Grip III M.D.   On: 07/01/2019 11:23   NM Pulmonary Perfusion  Result Date:  07/13/2019 CLINICAL DATA:  Hypoxemia EXAM: NUCLEAR MEDICINE PERFUSION LUNG SCAN TECHNIQUE: Perfusion images were obtained in multiple projections after intravenous injection of radiopharmaceutical. Ventilation scans intentionally deferred if perfusion scan and chest x-ray adequate for interpretation during COVID 19 epidemic. RADIOPHARMACEUTICALS:  4.1 mCi Tc-43mMAA IV COMPARISON:  Chest x-ray 07/12/2019 FINDINGS: No perfusion defects seen to suggest pulmonary embolus. IMPRESSION: No evidence of pulmonary embolus. Electronically Signed   By: KRolm BaptiseM.D.   On: 07/13/2019 22:12   UKoreaRenal Transplant w/Doppler  Result Date: 07/16/2019 CLINICAL DATA:  Renal transplant right lower quadrant, acute renal failure EXAM: ULTRASOUND OF RENAL TRANSPLANT WITH RENAL DOPPLER ULTRASOUND TECHNIQUE: Ultrasound examination of the renal transplant was performed with gray-scale, color and duplex doppler evaluation. COMPARISON:  None. FINDINGS: Transplant kidney location: RLQ Transplant Kidney: Renal measurements: 13.6 x 7.1 x 8.6 cm = volume: 4364m Normal in size and parenchymal echogenicity. No evidence of mass or hydronephrosis. No peri-transplant fluid collection seen. Color flow in the main renal artery:  Yes Color flow in the main renal vein:  Yes Duplex Doppler Evaluation: Main Renal Artery Resistive Index: 0.84 Venous waveform in main renal vein:  Present Intrarenal resistive index in upper pole:  0.67 (normal 0.6-0.8; equivocal 0.8-0.9; abnormal >= 0.9) Intrarenal resistive index in lower pole: 0.62 (normal 0.6-0.8; equivocal 0.8-0.9; abnormal >= 0.9) Bladder: Normal for degree of bladder distention. Other findings:  None. IMPRESSION: 1. Unremarkable right lower quadrant transplant kidney. Electronically Signed   By: MiRanda Ngo.D.   On: 07/16/2019 19:31   US[place]enous Img Upper Uni Left  Result Date: 07/12/2019 CLINICAL DATA:  [age]ear old male with left arm pain. Evaluate for DVT. EXAM: Left UPPER EXTREMITY  VENOUS DOPPLER  ULTRASOUND TECHNIQUE: Gray-scale sonography with graded compression, as well as color Doppler and duplex ultrasound were performed to evaluate the upper extremity deep venous system from the level of the subclavian vein and including the jugular, axillary, basilic, radial, ulnar and upper cephalic vein. Spectral Doppler was utilized to evaluate flow at rest and with distal augmentation maneuvers. COMPARISON:  None. FINDINGS: Contralateral Subclavian Vein: Respiratory phasicity is normal and symmetric with the symptomatic side. No evidence of thrombus. Normal compressibility. Internal Jugular Vein: No evidence of thrombus. Normal compressibility, respiratory phasicity and response to augmentation. Subclavian Vein: No evidence of thrombus. Normal compressibility, respiratory phasicity and response to augmentation. Axillary Vein: No evidence of thrombus. Normal compressibility, respiratory phasicity and response to augmentation. Cephalic Vein: There is occlusive thrombus in the distal cephalic vein at the antecubital fossa at the site of fistula anastomosis. Basilic Vein: No evidence of thrombus. Normal compressibility, respiratory phasicity and response to augmentation. Brachial Veins: No evidence of thrombus. Normal compressibility, respiratory phasicity and response to augmentation. Radial Veins: No evidence of thrombus. Normal compressibility, respiratory phasicity and response to augmentation. Ulnar Veins: No evidence of thrombus. Normal compressibility, respiratory phasicity and response to augmentation. Venous Reflux:  None visualized. Other Findings:  None visualized. IMPRESSION: Occluded cephalic vein at the AV fistula outflow tract in the antecubital fossa. No other thrombus identified. These results were called by telephone at the time of interpretation on 07/12/2019 at 8:12 pm to provider Merlyn Lot , who verbally acknowledged these results. Electronically Signed   By: Anner Crete  M.D.   On: 07/12/2019 20:30   DG Chest Port 1 View  Result Date: 06/30/2019 CLINICAL DATA:  59 year old male with cough and congestion. EXAM: PORTABLE CHEST 1 VIEW COMPARISON:  Chest radiograph dated 05/04/2019. FINDINGS: There is cardiomegaly with vascular congestion and probable edema. Diffuse interstitial nodularity may be related to vascular congestion although developing infiltrate is not excluded clinical correlation is recommended. There is no pleural effusion or pneumothorax. Calcified right paratracheal granuloma. Median sternotomy wires and CABG vascular clips. No acute osseous pathology. IMPRESSION: Cardiomegaly with findings of CHF. Pneumonia is not excluded. Electronically Signed   By: Anner Crete M.D.   On: 06/30/2019 22:34   DG Shoulder Left  Result Date: 07/14/2019 CLINICAL DATA:  Rheumatoid arthritis, polyarthritis, no known injury, COVID EXAM: LEFT SHOULDER - 2+ VIEW COMPARISON:  None. FINDINGS: There is no evidence of fracture or dislocation. There is no evidence of arthropathy or other focal bone abnormality. Soft tissues are unremarkable. IMPRESSION: No fracture or dislocation of the left shoulder. Joint spaces are preserved. Electronically Signed   By: Eddie Candle M.D.   On: 07/14/2019 12:43   (Echo, Carotid, EGD, Colonoscopy, ERCP)    Subjective: Seen and examined on the day of discharge No complaints, feels well Creatinine baseline Pain and range of motion improved Stable for discharge home Discussed with wife via phone at bedside  Discharge Exam: Vitals:   07/18/19 0638 07/18/19 0914  BP:  (!) 188/71  Pulse: 71 71  Resp: 19 13  Temp:  98.1 F (36.7 C)  SpO2: 97% 93%   Vitals:   07/18/19 0400 07/18/19 0424 07/18/19 0638 07/18/19 0914  BP:  (!) 169/67  (!) 188/71  Pulse: 63 69 71 71  Resp: _0 Temp:    98.1 F (36.7 C)  TempSrc:    Oral  SpO2: 97% 96% 97% 93%  Weight:      Height:  General: Pt is alert, awake, not in acute  distress Cardiovascular: RRR, S1/S2 +, no rubs, no gallops Respiratory: CTA bilaterally, no wheezing, no rhonchi Abdominal: Soft, NT, ND, bowel sounds + Extremities: no edema, no cyanosis    The results of significant diagnostics from this hospitalization (including imaging, microbiology, ancillary and laboratory) are listed below for reference.     Microbiology: Recent Results (from the past 240 hour(s))  CULTURE, BLOOD (ROUTINE X 2) w Reflex to ID Panel     Status: None   Collection Time: 07/13/19  7:07 PM   Specimen: BLOOD  Result Value Ref Range Status   Specimen Description BLOOD BLOOD RIGHT HAND  Final   Special Requests   Final    BOTTLES DRAWN AEROBIC AND ANAEROBIC Blood Culture adequate volume   Culture   Final    NO GROWTH 5 DAYS Performed at [facility], [address] [place], [place] [address]    Report Status 07/18/2019 FINAL  Final  CULTURE, BLOOD (ROUTINE X 2) w Reflex to ID Panel     Status: None   Collection Time: 07/13/19  7:08 PM   Specimen: BLOOD  Result Value Ref Range Status   Specimen Description BLOOD RIGHT ANTECUBITAL  Final   Special Requests   Final    BOTTLES DRAWN AEROBIC AND ANAEROBIC Blood Culture adequate volume   Culture   Final    NO GROWTH 5 DAYS Performed at [facility], [address] [place], [place] [address]    Report Status 07/18/2019 FINAL  Final     Labs: BNP (last 3 results) Recent Labs    01/16/19 0534 05/03/19 1307 06/01/19 1130  BNP 125.0* 109.2* 409.7*   Basic Metabolic Panel: Recent Labs  Lab 07/14/19 0424 07/15/19 0615 07/16/19 0639 07/17/19 0741 07/18/19 0450  NA 139 135 137 132* 140  K 3.7 3.9 4.0 4.1 4.3  CL 96* 94* 95* 93* 101  CO2 _0 32  GLUCOSE 124* 358* 273* 338* 209*  BUN 31* 55* 93* 97* 97*  CREATININE 1.88* 1.98*  1.89* 2.10* 1.95* 1.41*  CALCIUM 8.4* 8.8* 9.3 9.1 9.5  MG  --   --   --   --  2.6*  PHOS  --   --   --   --  3.1   Liver Function Tests: No  results for input(s): AST, ALT, ALKPHOS, BILITOT, PROT, ALBUMIN in the last 168 hours. No results for input(s): LIPASE, AMYLASE in the last 168 hours. No results for input(s): AMMONIA in the last 168 hours. CBC: Recent Labs  Lab 07/13/19 0432 07/14/19 0424 07/15/19 0615 07/16/19 0639 07/17/19 0741  WBC 7.9 11.5* 7.5 7.8 5.6  NEUTROABS  --   --  6.6 6.9 4.8  HGB 11.3* 10.2* 11.0* 11.2* 10.9*  HCT 36.6* 32.4* 35.2* 35.4* 33.9*  MCV 81.0 79.8* 80.4 78.8* 77.4*  PLT 141* 139* 152 189 165   Cardiac Enzymes: No results for input(s): CKTOTAL, CKMB, CKMBINDEX, TROPONINI in the last 168 hours. BNP: Invalid input(s): POCBNP CBG: Recent Labs  Lab 07/17/19 0916 07/17/19 1222 07/17/19 1659 07/17/19 2112 07/18/19 0915  GLUCAP 298* 304* 299* 340* 224*   D-Dimer No results for input(s): DDIMER in the last 72 hours. Hgb A1c No results for input(s): HGBA1C in the last 72 hours. Lipid Profile No results for input(s): CHOL, HDL, LDLCALC, TRIG, CHOLHDL, LDLDIRECT in the last 72 hours. Thyroid function studies No results for input(s): TSH, T4TOTAL, T3FREE, THYROIDAB in the last  72 hours.  Invalid input(s): FREET3 Anemia work up No results for input(s): VITAMINB12, FOLATE, FERRITIN, TIBC, IRON, RETICCTPCT in the last 72 hours. Urinalysis    Component Value Date/Time   COLORURINE AMBER (A) 07/01/2019 0129   APPEARANCEUR CLOUDY (A) 07/01/2019 0129   APPEARANCEUR Clear 09/23/2014 0855   LABSPEC 1.015 07/01/2019 0129   PHURINE 5.0 07/01/2019 0129   GLUCOSEU 50 (A) 07/01/2019 0129   HGBUR SMALL (A) 07/01/2019 0129   BILIRUBINUR NEGATIVE 07/01/2019 0129   BILIRUBINUR Negative 09/23/2014 [place] NEGATIVE 07/01/2019 0129   PROTEINUR 100 (A) 07/01/2019 0129   NITRITE NEGATIVE 07/01/2019 0129   LEUKOCYTESUR NEGATIVE 07/01/2019 0129   Sepsis Labs Invalid input(s): PROCALCITONIN,  WBC,  LACTICIDVEN Microbiology Recent Results (from the past 240 hour(s))  CULTURE, BLOOD (ROUTINE  X 2) w Reflex to ID Panel     Status: None   Collection Time: 07/13/19  7:07 PM   Specimen: BLOOD  Result Value Ref Range Status   Specimen Description BLOOD BLOOD RIGHT HAND  Final   Special Requests   Final    BOTTLES DRAWN AEROBIC AND ANAEROBIC Blood Culture adequate volume   Culture   Final    NO GROWTH 5 DAYS Performed at [facility], [address] [place], [place] [address]    Report Status 07/18/2019 FINAL  Final  CULTURE, BLOOD (ROUTINE X 2) w Reflex to ID Panel     Status: None   Collection Time: 07/13/19  7:08 PM   Specimen: BLOOD  Result Value Ref Range Status   Specimen Description BLOOD RIGHT ANTECUBITAL  Final   Special Requests   Final    BOTTLES DRAWN AEROBIC AND ANAEROBIC Blood Culture adequate volume   Culture   Final    NO GROWTH 5 DAYS Performed at [facility], [address] [place], [place] [address]    Report Status 07/18/2019 FINAL  Final     Time coordinating discharge: Over 30 minutes  SIGNED:   Sidney Ace, MD  [facility] 07/18/2019, 11:10 AM Pager   If 7PM-7AM, please contact night-coverage

## 2019-07-18 NOTE — Progress Notes (Signed)
[name].  MRN: [id number]  DOB/AGE: August 31, 1960 59 y.o.  Primary Care Physician:Letvak, Theophilus Kinds, MD  Admit date: 07/12/2019  Chief Complaint:  Chief Complaint  Patient presents with  . Weakness    S-Pt presented on  07/12/2019 with  Chief Complaint  Patient presents with  . Weakness  . Patient offers no new complaints. Patient main complaint was how are my kidneys doing.  I then took the opportunity and discussed patient lab results at length.  Patient voiced understanding  Medications . allopurinol  50 mg Oral Daily  . amLODipine  5 mg Oral Daily  . apixaban  5 mg Oral BID  . vitamin C  250 mg Oral Daily  . budesonide  2 puff Inhalation BID  . buPROPion  150 mg Oral BID  . ferrous sulfate  650 mg Oral BID  . gabapentin  300 mg Oral QHS  . insulin aspart  0-20 Units Subcutaneous TID AC & HS  . insulin aspart  7 Units Subcutaneous TID WC  . insulin glargine  28 Units Subcutaneous QHS  . mouth rinse  15 mL Mouth Rinse BID  . montelukast  10 mg Oral QHS  . multivitamin with minerals  1 tablet Oral Daily  . mycophenolate  360 mg Oral BID  . pantoprazole  40 mg Oral Daily  . rosuvastatin  10 mg Oral Daily  . tacrolimus  2 mg Oral BID  . tamsulosin  0.4 mg Oral Daily  . zinc sulfate  220 mg Oral BID         DVV:OHYWV from the symptoms mentioned above,there are no other symptoms referable to all systems reviewed.  Physical Exam: Vital signs in last 24 hours: Temp:  [97.6 F (36.4 C)-98.1 F (36.7 C)] 97.6 F (36.4 C) (04/24 2059) Pulse Rate:  [63-71] 71 (04/25 0638) Resp:  [17-20] 19 (04/25 0638) BP: (152-169)/(56-82) 169/67 (04/25 0424) SpO2:  [95 %-98 %] 97 % (04/25 3710) Weight change:  Last BM Date: 07/17/19  Intake/Output from previous day: 04/24 0701 - 04/25 0700 In: 1443.4 [I.V.:1443.4] Out: 2050 [Urine:2050] No intake/output data recorded.   Physical Exam: General- pt is awake,alert, oriented to time place and person Resp- No  acute REsp distress, CTA B/L NO Rhonchi CVS- S1S2 regular in rate and rhythm GIT- BS+, soft, NT, ND EXT- NO LE Edema, Cyanosis Access patient has left AV fistula which is positive bruit and thrill  Lab Results: CBC Recent Labs    07/16/19 0639 07/17/19 0741  WBC 7.8 5.6  HGB 11.2* 10.9*  HCT 35.4* 33.9*  PLT 189 165    BMET Recent Labs    07/17/19 0741 07/18/19 0450  NA 132* 140  K 4.1 4.3  CL 93* 101  CO2 29 32  GLUCOSE 338* 209*  BUN 97* 97*  CREATININE 1.95* 1.41*  CALCIUM 9.1 9.5    Creatinine trend 2021 2.1==>1.9==>1.4 1.7--1.9 during earlier March admission 1.5--1.9 during February admission 2020 1.3--1.5 2019 1.0--1.2 2018 1.2--1.5  MICRO Recent Results (from the past 240 hour(s))  CULTURE, BLOOD (ROUTINE X 2) w Reflex to ID Panel     Status: None   Collection Time: 07/13/19  7:07 PM   Specimen: BLOOD  Result Value Ref Range Status   Specimen Description BLOOD BLOOD RIGHT HAND  Final   Special Requests   Final    BOTTLES DRAWN AEROBIC AND ANAEROBIC Blood Culture adequate volume   Culture   Final    NO GROWTH 5 DAYS Performed  at [facility], [address] [place], [place] [address]    Report Status 07/18/2019 FINAL  Final  CULTURE, BLOOD (ROUTINE X 2) w Reflex to ID Panel     Status: None   Collection Time: 07/13/19  7:08 PM   Specimen: BLOOD  Result Value Ref Range Status   Specimen Description BLOOD RIGHT ANTECUBITAL  Final   Special Requests   Final    BOTTLES DRAWN AEROBIC AND ANAEROBIC Blood Culture adequate volume   Culture   Final    NO GROWTH 5 DAYS Performed at [name], [address], [place], [place] [address]    Report Status 07/18/2019 FINAL  Final      Lab Results  Component Value Date   CALCIUM 9.5 07/18/2019   CAION 1.29 05/03/2019   PHOS 3.1 07/18/2019               Impression:  Mr. 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 Patient is s/p deceased donor renal transplant done on September 13, 2010. Patient CKD progression has been marked with multiple episodes of AKI during past few months Patient is on triple immunosuppression regimen Patient is on Myfortic 360 mg p.o. twice daily Prograf 2 mg p.o. twice daily Prednisone 60 mg p.o. daily  Tacrolimus level high at 17.8 on 4/22-this is most likely secondary to the draw time as it was drawn at 902 after patient's morning dose We will continue to follow patient's tacrolimus levels  High BUN most likely secondary to diuresis and steroids  AKI now better Patient creatinine is back to baseline  2)HTN Patient blood pressure is stable  3)Anemia of chronic disease  HGb at goal (9--11)   4) gout Patient is now on allopurinol  5) left arm pain Patient was recently seen by rheumatology had a joint aspiration done. Patient is now diagnosed with acute gout and pseudogout. Patient is s/p intra-articular steroid Patient is now clinically better   6) electrolytes   sodium Patient had hyponatremic secondary to hyperglycemia Now much better  potassium Normokalemic    7)Acid base Co2 at goal     Plan:  We will DC IV fluids We will continue to follow Chem-7 We will continue the current immunosuppressive regimen. If patient is discharged patient will benefit from outpatient nephrology follow-up     Laylanie Kruczek s Theador Hawthorne 07/18/2019, 7:58 AM

## 2019-07-19 ENCOUNTER — Telehealth: Payer: Self-pay

## 2019-07-19 NOTE — Telephone Encounter (Signed)
Patient scheduled appointment with Dr.Letvak on 07/23/19.

## 2019-07-19 NOTE — Telephone Encounter (Addendum)
Carl Oka, LPN  [name], MD; Randall An, RN  Faythe Ghee. I do TCM calls only. Regular hospital/ED follow ups are done by the office. I will forward this one to [facility]        Pt needs an hospital f/u

## 2019-07-20 ENCOUNTER — Other Ambulatory Visit: Payer: Self-pay | Admitting: *Deleted

## 2019-07-20 LAB — TACROLIMUS LEVEL
Tacrolimus (FK506) - LabCorp: 9.5 ng/mL (ref 2.0–20.0)
Tacrolimus (FK506) - LabCorp: 10 ng/mL (ref 2.0–20.0)

## 2019-07-20 NOTE — Patient Outreach (Signed)
[facility]) Care Management  07/20/2019  Boomer Jorel Gravlin 09-30-1960 [id number]   Transition of care call  Referral received:07/02/19 Initial outreach:07/06/19 Insurance: [facility] readmission at [facility] 4/19-4/25/21. Already Active with [facility] care management .    Subjective: Successful outreach call to patient following recent hospital readmission/discharge.  Explained purpose of call and completed transition of care assessment.   Keilen Chovanec reports that he is feeling better, reports that she still has stiffness especially in his knees that make it a difficult getting around. He reports using rolling walker and having 3in1 that easing being able to use the commode. He reports that pain is manageable. He reports  tolerating diet, interesting in having additional information on gout and how to prevent with diet choices. He denies shortness of breath swelling, reports that he is below his dry weight. Patient spouse is available to assist in recovery, patient states that he will have transportation to appointments if he is unable to drive. Patient expressed being ready to start home therapy as his son graduation college in next month and he wants to be stronger.   He discussed still being on prednisone taper and blood sugars are running a little higher than usual, 180- 300, he reports taking insulin as prescribed, denies additional education support on diet, he reports monitoring blood sugar at least 4 times a day. He discussed having upcoming appointment with Dr. Gabriel Carina reinforced to notify MD of continued elevation in blood sugar and discussed importance of glucose control .  Patient states receiving call for scheduled visit with [facility] management  Nurse and they will reschedule visit . says she/he does not need a referral to one of the [place] chronic disease management programs.   He uses a [facility] outpatient pharmacy at  [name].   Objective:  Mr.Mongiello  was hospitalized at [facility] from 6/81-2/75/17 for Cephalic vein thrombus, AV fistula occlusion , Acute Gout Flare  Comorbidities include: Pneumonia due to Covid 19 Admission 4/7-4/11/21, permanent Atrial fib, Chronic combined systolic diastolic heart failure, Diabetes, ESRD s/p renal transplant 2012, GERD   He was discharged to home on 4/25,with [facility] health services for RN, PT/OT and DME of rolling walker and 3 in 1 .   Assessment:  Patient voices good understanding of all discharge instructions.  See transition of care flowsheet for assessment details.   Plan:  Reviewed hospital discharge diagnosis of Cephalic vein thrombus, acute gout flare   and discharge treatment plan using hospital discharge instructions, assessing medication adherence, reviewing problems requiring provider notification, and discussing the importance of follow up with surgeon, primary care provider and/or specialists as directed.  Will send patient EMMI handout of low purine diet.   Placed call to [facility], to follow up on patient services, spoke with Colletta Maryland she reports agency attempted call to patient on 4/26, able to leave a message for patient. Colletta [place] agreed to attempt outreach to patient after this call and follow up with me. She reports unsuccessful attempt to patient able to leave a message. I attempted return call to patient for follow up ,able to leave message for return call.   1300  Patient returned called updated him that [facility] home health has attempted call to him and able to leave a message,he reviewed his phone and able to identify ,and states he will return call.  Will plan return call to patient in the next 4 business days  for follow up on home health services beginning.    Joylene Draft, RN, BSN  [place] Management Coordinator  [phone]- Mobile [phone]- Toll Free Main  Office

## 2019-07-21 ENCOUNTER — Telehealth: Payer: Self-pay | Admitting: Internal Medicine

## 2019-07-21 DIAGNOSIS — I82612 Acute embolism and thrombosis of superficial veins of left upper extremity: Secondary | ICD-10-CM | POA: Diagnosis not present

## 2019-07-21 DIAGNOSIS — M069 Rheumatoid arthritis, unspecified: Secondary | ICD-10-CM | POA: Diagnosis not present

## 2019-07-21 DIAGNOSIS — I5030 Unspecified diastolic (congestive) heart failure: Secondary | ICD-10-CM | POA: Diagnosis not present

## 2019-07-21 DIAGNOSIS — M1039 Gout due to renal impairment, multiple sites: Secondary | ICD-10-CM | POA: Diagnosis not present

## 2019-07-21 DIAGNOSIS — E1122 Type 2 diabetes mellitus with diabetic chronic kidney disease: Secondary | ICD-10-CM | POA: Diagnosis not present

## 2019-07-21 DIAGNOSIS — I130 Hypertensive heart and chronic kidney disease with heart failure and stage 1 through stage 4 chronic kidney disease, or unspecified chronic kidney disease: Secondary | ICD-10-CM | POA: Diagnosis not present

## 2019-07-21 DIAGNOSIS — I2510 Atherosclerotic heart disease of native coronary artery without angina pectoris: Secondary | ICD-10-CM | POA: Diagnosis not present

## 2019-07-21 DIAGNOSIS — N1831 Chronic kidney disease, stage 3a: Secondary | ICD-10-CM | POA: Diagnosis not present

## 2019-07-21 DIAGNOSIS — T82868D Thrombosis of vascular prosthetic devices, implants and grafts, subsequent encounter: Secondary | ICD-10-CM | POA: Diagnosis not present

## 2019-07-21 NOTE — Telephone Encounter (Signed)
Ria Comment with [facility] calling for verbal orders for skilled nursing.  1 x 5 weeks

## 2019-07-22 ENCOUNTER — Other Ambulatory Visit: Payer: Self-pay | Admitting: *Deleted

## 2019-07-22 NOTE — Patient Outreach (Signed)
[facility] [place]) Care Management  07/22/2019  [name] 03-Sep-1960 [id number]   Case Conference    Patient case was discussed in a multidisciplinary case discussion on today.  Reviewed recent readmission and current plans in place for follow up medical appointments, Home health physical therapy/RN services in place, education resource for chronic condition management through [place] benefit of Active health management condition help.    Plan Follow up transition of care outreach call on today, and if no new care coordination needs to be addressed will plan case closure.    Joylene Draft, RN, BSN  [place] Management Coordinator  [phone]- Mobile [phone]- Toll Free Main Office

## 2019-07-22 NOTE — Patient Outreach (Signed)
[facility]) Care Management  07/22/2019  [name] [birth year] [id number]   Transition of care telephone call/follow up   Referral received:07/02/19 Initial outreach:07/06/19 Insurance: [facility] readmission at [facility] 4/19-4/25/21. Already Active with [facility] care management  1116 Subjective: Unsuccessful follow up outreach call, able to leave a HIPAA compliant message for return call.  1200 Return call from patient,he reports doing okay hanging in there. He reports having initial visit from [facility] and plans for physical therapy visit. He discussed still having discomfort in especially in knees. He continues to use rolling walker.  He reports that his weights are stable, he continues to keep a log of daily blood pressure, weights, oxygen saturation , blood sugars.  He discussed final dose of prednisone on today. He reports some  improvement in blood sugars, 130 this am, up to 300 last evening with recheck later down to 180. Reinforced continued  modification in diet.  He discussed receiving call from [facility] and informed that he needs to wait  90 before receiving 2nd covid vaccine.   Reviewed upcoming appointments with patient , Dr. Silvio Pate on 4/30, Rheumatology on 5/4, Nephrology on 5/7, he plans to reschedule appointment with Dr. Gabriel Carina.  Provided patient with contact number for Active health management to reschedule visit with health coach .     Objective: [name] was hospitalized [facility] from 0/76-8/08/81 for Cephalic vein thrombus, AV fistula occlusion , Acute Gout Flare  Comorbidities include: Pneumonia due to Covid 19 Admission 4/7-4/11/21, permanent Atrial fib, Chronic combined systolic diastolic heart failure, Diabetes, ESRD s/p renal transplant 2012, GERD   He was discharged to home on 4/25,with [facility] services for RN, PT/OT  and DME of rolling walker and 3 in 1.  Plan: No ongoing care management needs identified, will close patient case to [facility] care management services.    Joylene Draft, RN, BSN  [place] Management Coordinator  [phone]- Mobile [phone]- Toll Free Main Office

## 2019-07-22 NOTE — Telephone Encounter (Signed)
Verbal orders left on VM

## 2019-07-22 NOTE — Telephone Encounter (Signed)
That is fine

## 2019-07-23 ENCOUNTER — Encounter: Payer: Self-pay | Admitting: Internal Medicine

## 2019-07-23 ENCOUNTER — Telehealth: Payer: Self-pay | Admitting: Internal Medicine

## 2019-07-23 ENCOUNTER — Telehealth (INDEPENDENT_AMBULATORY_CARE_PROVIDER_SITE_OTHER): Payer: 59 | Admitting: Internal Medicine

## 2019-07-23 DIAGNOSIS — M1000 Idiopathic gout, unspecified site: Secondary | ICD-10-CM

## 2019-07-23 DIAGNOSIS — E1122 Type 2 diabetes mellitus with diabetic chronic kidney disease: Secondary | ICD-10-CM | POA: Diagnosis not present

## 2019-07-23 DIAGNOSIS — I82722 Chronic embolism and thrombosis of deep veins of left upper extremity: Secondary | ICD-10-CM | POA: Diagnosis not present

## 2019-07-23 DIAGNOSIS — I82612 Acute embolism and thrombosis of superficial veins of left upper extremity: Secondary | ICD-10-CM | POA: Diagnosis not present

## 2019-07-23 DIAGNOSIS — I2510 Atherosclerotic heart disease of native coronary artery without angina pectoris: Secondary | ICD-10-CM | POA: Diagnosis not present

## 2019-07-23 DIAGNOSIS — M069 Rheumatoid arthritis, unspecified: Secondary | ICD-10-CM | POA: Diagnosis not present

## 2019-07-23 DIAGNOSIS — I82729 Chronic embolism and thrombosis of deep veins of unspecified upper extremity: Secondary | ICD-10-CM | POA: Insufficient documentation

## 2019-07-23 DIAGNOSIS — T82868D Thrombosis of vascular prosthetic devices, implants and grafts, subsequent encounter: Secondary | ICD-10-CM | POA: Diagnosis not present

## 2019-07-23 DIAGNOSIS — G479 Sleep disorder, unspecified: Secondary | ICD-10-CM

## 2019-07-23 DIAGNOSIS — I5030 Unspecified diastolic (congestive) heart failure: Secondary | ICD-10-CM | POA: Diagnosis not present

## 2019-07-23 DIAGNOSIS — N1831 Chronic kidney disease, stage 3a: Secondary | ICD-10-CM | POA: Diagnosis not present

## 2019-07-23 DIAGNOSIS — I252 Old myocardial infarction: Secondary | ICD-10-CM

## 2019-07-23 DIAGNOSIS — I130 Hypertensive heart and chronic kidney disease with heart failure and stage 1 through stage 4 chronic kidney disease, or unspecified chronic kidney disease: Secondary | ICD-10-CM | POA: Diagnosis not present

## 2019-07-23 DIAGNOSIS — M1039 Gout due to renal impairment, multiple sites: Secondary | ICD-10-CM | POA: Diagnosis not present

## 2019-07-23 MED ORDER — PREDNISONE 10 MG PO TABS
10.0000 mg | ORAL_TABLET | Freq: Every day | ORAL | 0 refills | Status: DC
Start: 2019-07-23 — End: 2019-08-12

## 2019-07-23 MED ORDER — ALLOPURINOL 100 MG PO TABS
50.0000 mg | ORAL_TABLET | Freq: Every day | ORAL | 1 refills | Status: DC
Start: 2019-07-23 — End: 2019-11-15

## 2019-07-23 NOTE — Patient Instructions (Signed)
Please restart the prednisone at 473m daily till you see Dr KJefm Bryant Start the allopurinol at 1/2 tab daily (595m You can try over the counter melatonin -- 73m38m- 1-2 hours before sleep (to see if it helps). Let me know if you are still not sleeping better by next week

## 2019-07-23 NOTE — Assessment & Plan Note (Signed)
Past trouble and now with a hard time since coming home Seems to be related to the prednisone and pain from the gout Discussed Rx for the gout Can try low dose melatonin Consider brief resumption of trazodone that he had in the past if not improving

## 2019-07-23 NOTE — Assessment & Plan Note (Signed)
Acute polyarticular gout Caused the pain, etc that prompted his hospitalization Now pain has recurred, though not as severe Clear inflammation in left hand and wrist (though limited with virtual visit) Never got the allopurinol as outpatient  Will restart the prednisone at 66m daily till he sees Dr KJefm BryantRx for the allopurinol--516mdaily  Has appt next week with Dr KeJefm Bryant

## 2019-07-23 NOTE — Telephone Encounter (Signed)
Verbal orders left on VM for Stacy.

## 2019-07-23 NOTE — Telephone Encounter (Signed)
[name],[facility], called.  Verbal approval for physical therapy starting next week 2 x a week for 2 weeks, 1 time a week for 2 weeks. Detailed message may be left on voice mail.

## 2019-07-23 NOTE — Telephone Encounter (Signed)
That is okay

## 2019-07-23 NOTE — Progress Notes (Signed)
Subjective:    Patient ID: Carl Noam Karaffa., male    DOB: [birth year], 59 y.o.   MRN: [id number]  HPI Virtual visit for hospital follow up Identification done Reviewed billing and he gave consent Participants---patient at his home and I am in my office  Reviewed hospital stay Had gone to ER due to severe pain in shoulders, knees, wrists and hands (on left) Fistula clot found--but not the cause of the problems  Finally decided that this was polyarticular gout Seen by Dr Jefm Bryant  In hospital Improved with prednisone and had rapid wean  Did work with home PT today Did assessment Knees still hurt but left hand is still the worst--wrist and fingers Took last prednisone yesterday--5 mg  Current Outpatient Medications on File Prior to Visit  Medication Sig Dispense Refill  . amLODipine (NORVASC) 5 MG tablet Take 1 tablet (5 mg total) by mouth daily. 90 tablet 2  . apixaban (ELIQUIS) 5 MG TABS tablet Take 1 tablet (5 mg total) by mouth 2 (two) times daily. 180 tablet 1  . beclomethasone (QVAR) 80 MCG/ACT inhaler Inhale 2 puffs into the lungs 2 (two) times daily.    Marland Kitchen buPROPion (WELLBUTRIN SR) 150 MG 12 hr tablet TAKE 1 TABLET BY MOUTH TWICE DAILY (Patient taking differently: Take 150 mg by mouth 2 (two) times daily. ) 180 tablet 3  . dapagliflozin propanediol (FARXIGA) 10 MG TABS tablet Take 10 mg by mouth daily before breakfast. 30 tablet 6  . fluticasone (FLONASE) 50 MCG/ACT nasal spray PLACE 2 SPRAYS INTO BOTH NOSTRILS DAILY. 16 g 11  . gabapentin (NEURONTIN) 300 MG capsule Take 300 mg by mouth at bedtime.     Marland Kitchen HUMALOG KWIKPEN 100 UNIT/ML KwikPen Inject 5-10 Units into the skin 3 (three) times daily. Sliding scale    . insulin glargine (LANTUS) 100 unit/mL SOPN Inject 0.1 mLs (10 Units total) into the skin at bedtime. Currently using 15 units (Patient taking differently: Inject 10-15 Units into the skin at bedtime. ) 15 mL 11  . lisinopril (ZESTRIL) 40 MG tablet Take 0.5  tablets (20 mg total) by mouth daily. 90 tablet 3  . montelukast (SINGULAIR) 10 MG tablet TAKE 1 TABLET BY MOUTH AT BEDTIME (Patient taking differently: Take 10 mg by mouth at bedtime. ) 30 tablet 0  . Multiple Vitamin (MULTIVITAMIN WITH MINERALS) TABS tablet Take 1 tablet by mouth daily.    . mycophenolate (MYFORTIC) 180 MG EC tablet Take 360 mg by mouth 2 (two) times daily.    Marland Kitchen omeprazole (PRILOSEC) 20 MG capsule TAKE 1 CAPSULE BY MOUTH ONCE DAILY (Patient taking differently: Take 20 mg by mouth daily. ) 90 capsule 3  . potassium chloride (MICRO-K) 10 MEQ CR capsule Take 4 capsules (40 mEq total) by mouth every morning AND 2 capsules (20 mEq total) every evening. 360 capsule 4  . rosuvastatin (CRESTOR) 10 MG tablet TAKE 1 TABLET BY MOUTH DAILY (Patient taking differently: Take 10 mg by mouth daily. ) 90 tablet 3  . tacrolimus (PROGRAF) 1 MG capsule Take 2 mg by mouth 2 (two) times daily.     . tamsulosin (FLOMAX) 0.4 MG CAPS capsule Take 0.4 mg by mouth daily.     Marland Kitchen torsemide (DEMADEX) 20 MG tablet Take torsemide 80 mg in am and 40 mg in pm (Patient taking differently: Take 40 mg by mouth 2 (two) times daily. ) 210 tablet 3  . vitamin C (ASCORBIC ACID) 250 MG tablet Take 1 tablet (250 mg total)  by mouth daily. 30 tablet 0  . zinc sulfate 220 (50 Zn) MG capsule Take 1 capsule (220 mg total) by mouth 2 (two) times daily. 60 capsule 0  . benzonatate (TESSALON) 200 MG capsule Take 1 capsule (200 mg total) by mouth 3 (three) times daily as needed for cough. (Patient not taking: Reported on 07/23/2019) 60 capsule 0   No current facility-administered medications on file prior to visit.    Allergies  Allergen Reactions  . Acetaminophen Other (See Comments)    BC of transplant  . Contrast Media [Iodinated Diagnostic Agents]     Kidney transplant  . Ibuprofen Other (See Comments)    Due to kidney transplant Due to kidney transplant Due to kidney transplant    Past Medical History:  Diagnosis  Date  . Allergy    seasonal  . Amnestic MCI (mild cognitive impairment with memory loss)   . Asthma   . Benign neoplasm of colon   . CAD (coronary artery disease) 2005   a.  Status post four-vessel CABG in 2005; b. 03/2018 MV: Small, fixed inferoapical and apical septal defect. No ischemia. EF 47% (60-65% by 05/2018 Echo).  . Chronic atrial fibrillation ([place])    a. CHADS2VASc 4 (CHF, HTN, DM, vascular disease)--> Eliquis; b. 09/2018 Zio: Chronic Afib.  . Chronic combined systolic and diastolic CHF (congestive heart failure) ([place])    a.  7/18 Echo: EF 45-50%; b. 05/2018 Echo: EF 60-65%, RVSP 74.8; c. 12/2018 Echo: EF 50-55%. Sev dil LA.  Marland Kitchen Chronic venous stasis dermatitis of both lower extremities   . Cytomegaloviral disease ([place]) 2017  . Depression   . Diabetes mellitus   . Diverticulosis   . Dyspnea   . Erectile dysfunction   . ESRD (end stage renal disease) ([place]) 2005   a. HD 2004-2012; b. S/p renal transplant in 2012  . GERD (gastroesophageal reflux disease)   . Gout   . Hyperlipidemia    low since renal failure  . Hypertension   . Kidney transplant status, cadaveric 2012  . Obesity   . PAH (pulmonary artery hypertension) ([place])    a. 05/2018 Echo: RVSP 74.44mHg; b. 12/2018 RV not well visualized.  . Pneumonia   . [place]   . Sleep apnea    bipap with oxygen 2 l  . Trigger finger   . Ulcer 05/2016   Left shin  . Wears glasses     Past Surgical History:  Procedure Laterality Date  . BARIATRIC SURGERY    . CARDIAC CATHETERIZATION     [facility]  . CARDIAC CATHETERIZATION  05/03/2019  . CATARACT EXTRACTION W/PHACO Right 10/29/2017   Procedure: CATARACT EXTRACTION PHACO AND INTRAOCULAR LENS PLACEMENT ([place]  RIGHT DIABETIC;  Surgeon: BLeandrew Koyanagi MD;  [place] [facility]  Service: Ophthalmology;  Laterality: Right;  Diabetic - insulin  . CATARACT EXTRACTION W/PHACO Left 11/18/2017   Procedure: CATARACT EXTRACTION PHACO AND INTRAOCULAR LENS PLACEMENT ([place]  LEFT IVA/TOPICAL;  Surgeon: BLeandrew Koyanagi MD;  [place] [facility]  Service: Ophthalmology;  Laterality: Left;  DIABETES - insulin sleep apnea  . COLONOSCOPY WITH PROPOFOL N/A 04/22/2013   Procedure: COLONOSCOPY WITH PROPOFOL;  Surgeon: DMilus Banister MD;  [facility];  Service: Endoscopy;  Laterality: N/A;  . CORONARY ARTERY BYPASS GRAFT  2005   X 4  . DG ANGIO AV SHUNT*L*     right and left upper arms  . FASCIOTOMY  03/03/2012   Procedure: FASCIOTOMY;  Surgeon: GWynonia Sours MD;  Location: [facility];  Service: Orthopedics;  Laterality: Right;  FASCIOTOMY RIGHT SMALL FINGER  . FASCIOTOMY Left 08/17/2013   Procedure: FASCIOTOMY LEFT RING;  Surgeon: [name], MD;  Location: [facility];  Service: Orthopedics;  Laterality: Left;  . INCISION AND DRAINAGE ABSCESS Left 10/15/2015   Procedure: INCISION AND DRAINAGE ABSCESS;  Surgeon: [name], MD;  Location: [facility];  Service: General;  Laterality: Left;  . KIDNEY TRANSPLANT  09/13/2010   cadaver--at [facility]  . RIGHT HEART CATH N/A 11/15/2016   Procedure: RIGHT HEART CATH;  Surgeon: [name], MD;  Location: [place] [facility];  Service: Cardiovascular;  Laterality: N/A;  . RIGHT HEART CATH N/A 05/03/2019   Procedure: RIGHT HEART CATH;  Surgeon: [name], MD;  Location: [place] [facility];  Service: Cardiovascular;  Laterality: N/A;  . TYMPANIC MEMBRANE REPAIR  1/12   left  . VASECTOMY      Family History  Problem Relation Age of Onset  . Heart disease Father   . Kidney failure Father   . Kidney disease Father   . Diabetes Maternal Grandmother   . Breast cancer Maternal Grandmother   . Valvular heart disease Mother   . Liver cancer Paternal Uncle   . Liver cancer Paternal Grandmother   . Prostate cancer Neg Hx     Social History   Socioeconomic History  . Marital status: Married    Spouse name: Not on file  . Number of children: 2  . Years of  education: Not on file  . Highest education level: Not on file  Occupational History  . Occupation: Academic librarian: unemployed    Comment: disabled due to kidney failure  Tobacco Use  . Smoking status: Former Smoker    Types: Cigars    Quit date: 09/02/1994    Years since quitting: 24.9  . Smokeless tobacco: Never Used  Substance and Sexual Activity  . Alcohol use: Yes    Alcohol/week: 0.0 - 1.0 standard drinks    Comment: occasional- 3 in the past year  . Drug use: No  . Sexual activity: Not on file  Other Topics Concern  . Not on file  Social History Narrative   In school for culinary arts      Has living will   Wife is health care POA   Would accept resuscitation attempts   Hasn't considered tube feedings   Social Determinants of Health   Financial Resource Strain:   . Difficulty of Paying Living Expenses:   Food Insecurity:   . Worried About Charity fundraiser in the Last Year:   . Arboriculturist in the Last Year:   Transportation Needs:   . Film/video editor (Medical):   Marland Kitchen Lack of Transportation (Non-Medical):   Physical Activity:   . Days of Exercise per Week:   . Minutes of Exercise per Session:   Stress:   . Feeling of Stress :   Social Connections:   . Frequency of Communication with Friends and Family:   . Frequency of Social Gatherings with Friends and Family:   . Attends Religious Services:   . Active Member of Clubs or Organizations:   . Attends Archivist Meetings:   Marland Kitchen Marital Status:   Intimate Partner Violence:   . Fear of Current or Ex-Partner:   . Emotionally Abused:   Marland Kitchen Physically Abused:   . Sexually Abused:  Review of Systems No fever No chest pain or SOB Appetite is okay Not sleeping well---"like I have a ball of energy"    Objective:   Physical Exam  Constitutional: He appears well-developed. No distress.  Respiratory: Effort normal. No respiratory distress.  Musculoskeletal:     Comments:  Still moderate swelling in left wrist and hand           Assessment & Plan:

## 2019-07-23 NOTE — Assessment & Plan Note (Signed)
Clot in A-V fistula May well be chronic and didn't cause his symptoms Is on the eliquis for atrial fib anyway

## 2019-07-26 DIAGNOSIS — I130 Hypertensive heart and chronic kidney disease with heart failure and stage 1 through stage 4 chronic kidney disease, or unspecified chronic kidney disease: Secondary | ICD-10-CM | POA: Diagnosis not present

## 2019-07-26 DIAGNOSIS — I82612 Acute embolism and thrombosis of superficial veins of left upper extremity: Secondary | ICD-10-CM | POA: Diagnosis not present

## 2019-07-26 DIAGNOSIS — M069 Rheumatoid arthritis, unspecified: Secondary | ICD-10-CM | POA: Diagnosis not present

## 2019-07-26 DIAGNOSIS — G4733 Obstructive sleep apnea (adult) (pediatric): Secondary | ICD-10-CM | POA: Diagnosis not present

## 2019-07-26 DIAGNOSIS — E1122 Type 2 diabetes mellitus with diabetic chronic kidney disease: Secondary | ICD-10-CM | POA: Diagnosis not present

## 2019-07-26 DIAGNOSIS — N1831 Chronic kidney disease, stage 3a: Secondary | ICD-10-CM | POA: Diagnosis not present

## 2019-07-26 DIAGNOSIS — E662 Morbid (severe) obesity with alveolar hypoventilation: Secondary | ICD-10-CM | POA: Diagnosis not present

## 2019-07-26 DIAGNOSIS — M1039 Gout due to renal impairment, multiple sites: Secondary | ICD-10-CM | POA: Diagnosis not present

## 2019-07-26 DIAGNOSIS — T82868D Thrombosis of vascular prosthetic devices, implants and grafts, subsequent encounter: Secondary | ICD-10-CM | POA: Diagnosis not present

## 2019-07-26 DIAGNOSIS — I2510 Atherosclerotic heart disease of native coronary artery without angina pectoris: Secondary | ICD-10-CM | POA: Diagnosis not present

## 2019-07-26 DIAGNOSIS — I5030 Unspecified diastolic (congestive) heart failure: Secondary | ICD-10-CM | POA: Diagnosis not present

## 2019-07-27 ENCOUNTER — Telehealth: Payer: Self-pay

## 2019-07-27 ENCOUNTER — Encounter (HOSPITAL_COMMUNITY): Payer: Self-pay

## 2019-07-27 ENCOUNTER — Other Ambulatory Visit: Payer: Self-pay

## 2019-07-27 ENCOUNTER — Ambulatory Visit (HOSPITAL_COMMUNITY)
Admission: RE | Admit: 2019-07-27 | Discharge: 2019-07-27 | Disposition: A | Discharge status: Home / Self Care | Payer: 59 | Source: Ambulatory Visit | Attending: Cardiology | Admitting: Cardiology

## 2019-07-27 VITALS — BP 142/76 | HR 60 | Wt 258.2 lb

## 2019-07-27 DIAGNOSIS — I5032 Chronic diastolic (congestive) heart failure: Secondary | ICD-10-CM | POA: Insufficient documentation

## 2019-07-27 DIAGNOSIS — Z951 Presence of aortocoronary bypass graft: Secondary | ICD-10-CM | POA: Diagnosis not present

## 2019-07-27 DIAGNOSIS — T82868D Thrombosis of vascular prosthetic devices, implants and grafts, subsequent encounter: Secondary | ICD-10-CM | POA: Diagnosis not present

## 2019-07-27 DIAGNOSIS — I770 Arteriovenous fistula, acquired: Secondary | ICD-10-CM | POA: Insufficient documentation

## 2019-07-27 DIAGNOSIS — J45909 Unspecified asthma, uncomplicated: Secondary | ICD-10-CM | POA: Insufficient documentation

## 2019-07-27 DIAGNOSIS — I10 Essential (primary) hypertension: Secondary | ICD-10-CM

## 2019-07-27 DIAGNOSIS — E1121 Type 2 diabetes mellitus with diabetic nephropathy: Secondary | ICD-10-CM | POA: Diagnosis not present

## 2019-07-27 DIAGNOSIS — G4733 Obstructive sleep apnea (adult) (pediatric): Secondary | ICD-10-CM | POA: Diagnosis not present

## 2019-07-27 DIAGNOSIS — Z7901 Long term (current) use of anticoagulants: Secondary | ICD-10-CM | POA: Diagnosis not present

## 2019-07-27 DIAGNOSIS — Z87891 Personal history of nicotine dependence: Secondary | ICD-10-CM | POA: Diagnosis not present

## 2019-07-27 DIAGNOSIS — Z9884 Bariatric surgery status: Secondary | ICD-10-CM | POA: Insufficient documentation

## 2019-07-27 DIAGNOSIS — I272 Pulmonary hypertension, unspecified: Secondary | ICD-10-CM | POA: Insufficient documentation

## 2019-07-27 DIAGNOSIS — M79642 Pain in left hand: Secondary | ICD-10-CM | POA: Insufficient documentation

## 2019-07-27 DIAGNOSIS — E1122 Type 2 diabetes mellitus with diabetic chronic kidney disease: Secondary | ICD-10-CM | POA: Insufficient documentation

## 2019-07-27 DIAGNOSIS — I132 Hypertensive heart and chronic kidney disease with heart failure and with stage 5 chronic kidney disease, or end stage renal disease: Secondary | ICD-10-CM | POA: Diagnosis not present

## 2019-07-27 DIAGNOSIS — M1000 Idiopathic gout, unspecified site: Secondary | ICD-10-CM | POA: Diagnosis not present

## 2019-07-27 DIAGNOSIS — M109 Gout, unspecified: Secondary | ICD-10-CM | POA: Diagnosis not present

## 2019-07-27 DIAGNOSIS — M069 Rheumatoid arthritis, unspecified: Secondary | ICD-10-CM | POA: Diagnosis not present

## 2019-07-27 DIAGNOSIS — E669 Obesity, unspecified: Secondary | ICD-10-CM | POA: Diagnosis not present

## 2019-07-27 DIAGNOSIS — I2510 Atherosclerotic heart disease of native coronary artery without angina pectoris: Secondary | ICD-10-CM | POA: Diagnosis not present

## 2019-07-27 DIAGNOSIS — Z794 Long term (current) use of insulin: Secondary | ICD-10-CM | POA: Diagnosis not present

## 2019-07-27 DIAGNOSIS — Z79899 Other long term (current) drug therapy: Secondary | ICD-10-CM | POA: Diagnosis not present

## 2019-07-27 DIAGNOSIS — N183 Chronic kidney disease, stage 3 unspecified: Secondary | ICD-10-CM | POA: Diagnosis not present

## 2019-07-27 DIAGNOSIS — I255 Ischemic cardiomyopathy: Secondary | ICD-10-CM | POA: Insufficient documentation

## 2019-07-27 DIAGNOSIS — M1039 Gout due to renal impairment, multiple sites: Secondary | ICD-10-CM | POA: Diagnosis not present

## 2019-07-27 DIAGNOSIS — I130 Hypertensive heart and chronic kidney disease with heart failure and stage 1 through stage 4 chronic kidney disease, or unspecified chronic kidney disease: Secondary | ICD-10-CM | POA: Diagnosis not present

## 2019-07-27 DIAGNOSIS — Z8249 Family history of ischemic heart disease and other diseases of the circulatory system: Secondary | ICD-10-CM | POA: Insufficient documentation

## 2019-07-27 DIAGNOSIS — I82612 Acute embolism and thrombosis of superficial veins of left upper extremity: Secondary | ICD-10-CM | POA: Diagnosis not present

## 2019-07-27 DIAGNOSIS — I5042 Chronic combined systolic (congestive) and diastolic (congestive) heart failure: Secondary | ICD-10-CM | POA: Diagnosis not present

## 2019-07-27 DIAGNOSIS — I482 Chronic atrial fibrillation, unspecified: Secondary | ICD-10-CM | POA: Insufficient documentation

## 2019-07-27 DIAGNOSIS — E785 Hyperlipidemia, unspecified: Secondary | ICD-10-CM | POA: Diagnosis not present

## 2019-07-27 DIAGNOSIS — M1990 Unspecified osteoarthritis, unspecified site: Secondary | ICD-10-CM | POA: Diagnosis not present

## 2019-07-27 DIAGNOSIS — I5030 Unspecified diastolic (congestive) heart failure: Secondary | ICD-10-CM | POA: Diagnosis not present

## 2019-07-27 DIAGNOSIS — N1831 Chronic kidney disease, stage 3a: Secondary | ICD-10-CM | POA: Diagnosis not present

## 2019-07-27 DIAGNOSIS — Z940 Kidney transplant status: Secondary | ICD-10-CM | POA: Diagnosis not present

## 2019-07-27 NOTE — Patient Instructions (Signed)
INCREASE Torsemide to 60 mg twice a day for 2 days then resume 40 mg twice day thereafter  Your physician recommends that you schedule a follow-up appointment in: 2 weeks  in the Advanced Practitioners (PA/NP) Clinic   Do the following things EVERYDAY: 1) Weigh yourself in the morning before breakfast. Write it down and keep it in a log. 2) Take your medicines as prescribed 3) Eat low salt foods--Limit salt (sodium) to 2000 mg per day.  4) Stay as active as you can everyday 5) Limit all fluids for the day to less than 2 liters  At the [facility], you and your health needs are our priority. As part of our continuing mission to provide you with exceptional heart care, we have created designated Provider Care Teams. These Care Teams include your primary Cardiologist (physician) and Advanced Practice Providers (APPs- Physician Assistants and Nurse Practitioners) who all work together to provide you with the care you need, when you need it.   You may see any of the following providers on your designated Care Team at your next follow up: Marland Kitchen Dr Glori Bickers . Dr Loralie Champagne . Darrick Grinder, NP . Lyda Jester, PA . Audry Riles, PharmD   Please be sure to bring in all your medications bottles to every appointment.

## 2019-07-27 NOTE — Progress Notes (Signed)
ReDS Vest / Clip - 07/27/19 0953      ReDS Vest / Clip   Station Marker  D    Ruler Value  37    ReDS Value Range  (!) High volume overload    ReDS Actual Value  48    Anatomical Comments  sitting

## 2019-07-27 NOTE — Telephone Encounter (Signed)
Verbal orders left on verified VM

## 2019-07-27 NOTE — Progress Notes (Signed)
Heart Failure Note   Date:  07/27/2019   ID:  Carl Edelman., DOB [birth year], MRN [id number]  Location: Home  Provider location: [address] [place] Type of Visit:  Established patient  PCP:  [name], MD  Cardiologist:  [name] Primary HF: [name]  Chief Complaint: Heart Failure/Dyspnea    History of Present Illness: Carl Gadiel John. is a 59 y.o. male  with a history of CAD s/p CABG 2005, chronic atrial fibrillation on Eliquis, ESRD s/p renal transplant 2012, anc chronic diastolic CHF.  His renal function has been fairly stable, most recent creatinine 1.43.  He has been struggling with volume overload/CHF.  He has a history of ischemic cardiomyopathy with EF as low as 35-40% in the past but last echo in 3/20 showed EF 60-65%.  There is evidence for RV failure with pulmonary hypertension by echo.    Admitted 11/05/18 low grade fever and shortness of breath. HS Trop negative. Covid 19 negative. Blood CX negative. Placed on antibiotics and discharged to home the next day.   Admitted 06/24/33 with A/C Diastolic HF. Diuresed with IV lasix and transitioned to torsemide 80/40 mg . Discharge weight 252 pounds.   Admitted 06/2019 with left arm pain. Left upper extremity venous Doppler revealed occluded cephalic vein at the AV fistula outflow tract in the antecubital fossa with no other thrombus identified. Vascular evaluated. He continued on eliquis.  No plan for intervention. Also treated for acute gout flare. Discharged on prednisone taper.    Today he returns for HF follow up.Overall feeling better. Having ongoing left hand pain but getting better with steroids.  Mild SOB with exertion.  Denies PND/Orthopnea.Using BiPap every night. He has noticed increased leg edema.  Appetite ok. No fever or chills. Weight at home 248 pounds. Taking all medications.   Labs (7/19): LDL 49 Labs (2/20): K 3.7, creatinine 1.43 Labs (4/20): K 4.3, creatinine 1.24 Labs  (11/06/18): K 3.7 creatinine 1.23  Labs (05/07/19): K 3.8 Creatinine 1.5  Labs (07/18/19): K 4.3 Creatinine 1.42   PMH: 1. CAD: s/p CABG x 4 in [place] in 2005.  - Cardiolite (1/20): EF 47%, small fixed apical septal defect, no ischemia.  Low risk study.  2. Asthma 3. Gout 4. Atrial fibrillation: Chronic.  5. ESRD s/p renal transplantation at [facility] in 2012.  6. Anemia of chronic disease 7. Type II diabetes 8. HTN 9. Hyperlipidemia 10. OSA: On bipap.  11. Ischemic cardiomyopathy: EF 35-40% in past.  - Cardiolite (1/20) with EF 47%.  - Echo (1/20): EF 50-55%, mild MR, RV moderately dilated with moderately decreased systolic function, PASP 73 mmHg, moderate TR.   - Echo (3/20): EF 60-65%, PASP 75 mmHg, mildly dilated RV with mildly decreased systolic function.  12. Obesity: s/p gastric bypass in 2015.  13. Bradycardia with beta blocker.   Past Surgical History:  Procedure Laterality Date  . BARIATRIC SURGERY    . CARDIAC CATHETERIZATION     [facility]  . CARDIAC CATHETERIZATION  05/03/2019  . CATARACT EXTRACTION W/PHACO Right 10/29/2017   Procedure: CATARACT EXTRACTION PHACO AND INTRAOCULAR LENS PLACEMENT ([place])  RIGHT DIABETIC;  Surgeon: [name], MD;  Location: [place];  Service: Ophthalmology;  Laterality: Right;  Diabetic - insulin  . CATARACT EXTRACTION W/PHACO Left 11/18/2017   Procedure: CATARACT EXTRACTION PHACO AND INTRAOCULAR LENS PLACEMENT ([place]) LEFT IVA/TOPICAL;  Surgeon: [name], MD;  Location: [place];  Service: Ophthalmology;  Laterality:  Left;  DIABETES - insulin sleep apnea  . COLONOSCOPY WITH PROPOFOL N/A 04/22/2013   Procedure: COLONOSCOPY WITH PROPOFOL;  Surgeon: [name], MD;  [facility];  Service: Endoscopy;  Laterality: N/A;  . CORONARY ARTERY BYPASS GRAFT  2005   X 4  . DG ANGIO AV SHUNT*L*     right and left upper arms  . FASCIOTOMY  03/03/2012   Procedure: FASCIOTOMY;  Surgeon: [name], MD;   Location: [facility];  Service: Orthopedics;  Laterality: Right;  FASCIOTOMY RIGHT SMALL FINGER  . FASCIOTOMY Left 08/17/2013   Procedure: FASCIOTOMY LEFT RING;  Surgeon: [name], MD;  Location: [place];  Service: Orthopedics;  Laterality: Left;  . INCISION AND DRAINAGE ABSCESS Left 10/15/2015   Procedure: INCISION AND DRAINAGE ABSCESS;  Surgeon: [name], MD;  Location: [facility];  Service: General;  Laterality: Left;  . KIDNEY TRANSPLANT  09/13/2010   cadaver--at [facility]  . RIGHT HEART CATH N/A 11/15/2016   Procedure: RIGHT HEART CATH;  Surgeon: [name], MD;  Location: [facility];  Service: Cardiovascular;  Laterality: N/A;  . RIGHT HEART CATH N/A 05/03/2019   Procedure: RIGHT HEART CATH;  Surgeon: [name], MD;  Location: [place] [facility];  Service: Cardiovascular;  Laterality: N/A;  . TYMPANIC MEMBRANE REPAIR  1/12   left  . VASECTOMY       Current Outpatient Medications  Medication Sig Dispense Refill  . allopurinol (ZYLOPRIM) 100 MG tablet Take 0.5 tablets (50 mg total) by mouth daily. 30 tablet 1  . amLODipine (NORVASC) 5 MG tablet Take 1 tablet (5 mg total) by mouth daily. 90 tablet 2  . apixaban (ELIQUIS) 5 MG TABS tablet Take 1 tablet (5 mg total) by mouth 2 (two) times daily. 180 tablet 1  . beclomethasone (QVAR) 80 MCG/ACT inhaler Inhale 2 puffs into the lungs 2 (two) times daily.    . benzonatate (TESSALON) 200 MG capsule Take 1 capsule (200 mg total) by mouth 3 (three) times daily as needed for cough. 60 capsule 0  . buPROPion (WELLBUTRIN SR) 150 MG 12 hr tablet TAKE 1 TABLET BY MOUTH TWICE DAILY (Patient taking differently: Take 150 mg by mouth 2 (two) times daily. ) 180 tablet 3  . dapagliflozin propanediol (FARXIGA) 10 MG TABS tablet Take 10 mg by mouth daily before breakfast. 30 tablet 6  . fluticasone (FLONASE) 50 MCG/ACT nasal spray PLACE 2 SPRAYS INTO BOTH NOSTRILS DAILY. 16 g 11  . gabapentin  (NEURONTIN) 300 MG capsule Take 300 mg by mouth at bedtime.     Marland Kitchen HUMALOG KWIKPEN 100 UNIT/ML KwikPen Inject 5-10 Units into the skin 3 (three) times daily. Sliding scale    . insulin glargine (LANTUS) 100 unit/mL SOPN Inject 0.1 mLs (10 Units total) into the skin at bedtime. Currently using 15 units (Patient taking differently: Inject 10-15 Units into the skin at bedtime. ) 15 mL 11  . lisinopril (ZESTRIL) 40 MG tablet Take 0.5 tablets (20 mg total) by mouth daily. 90 tablet 3  . montelukast (SINGULAIR) 10 MG tablet TAKE 1 TABLET BY MOUTH AT BEDTIME (Patient taking differently: Take 10 mg by mouth at bedtime. ) 30 tablet 0  . Multiple Vitamin (MULTIVITAMIN WITH MINERALS) TABS tablet Take 1 tablet by mouth daily.    . mycophenolate (MYFORTIC) 180 MG EC tablet Take 360 mg by mouth 2 (two) times daily.    Marland Kitchen omeprazole (PRILOSEC) 20 MG capsule TAKE  1 CAPSULE BY MOUTH ONCE DAILY (Patient taking differently: Take 20 mg by mouth daily. ) 90 capsule 3  . potassium chloride (MICRO-K) 10 MEQ CR capsule Take 4 capsules (40 mEq total) by mouth every morning AND 2 capsules (20 mEq total) every evening. 360 capsule 4  . predniSONE (DELTASONE) 10 MG tablet Take 1 tablet (10 mg total) by mouth daily with breakfast. 30 tablet 0  . rosuvastatin (CRESTOR) 10 MG tablet TAKE 1 TABLET BY MOUTH DAILY (Patient taking differently: Take 10 mg by mouth daily. ) 90 tablet 3  . tacrolimus (PROGRAF) 1 MG capsule Take 2 mg by mouth 2 (two) times daily.     . tamsulosin (FLOMAX) 0.4 MG CAPS capsule Take 0.4 mg by mouth daily.     Marland Kitchen torsemide (DEMADEX) 20 MG tablet Take 40 mg by mouth 2 (two) times daily.    . vitamin C (ASCORBIC ACID) 250 MG tablet Take 1 tablet (250 mg total) by mouth daily. 30 tablet 0  . zinc sulfate 220 (50 Zn) MG capsule Take 1 capsule (220 mg total) by mouth 2 (two) times daily. 60 capsule 0   No current facility-administered medications for this encounter.    Allergies:   Acetaminophen, Contrast media  [iodinated diagnostic agents], and Ibuprofen   Social History:  The patient  reports that he quit smoking about 24 years ago. His smoking use included cigars. He has never used smokeless tobacco. He reports current alcohol use. He reports that he does not use drugs.   Family History:  The patient's family history includes Breast cancer in his maternal grandmother; Diabetes in his maternal grandmother; Heart disease in his father; Kidney disease in his father; Kidney failure in his father; Liver cancer in his paternal grandmother and paternal uncle; Valvular heart disease in his mother.   ROS:  Please see the history of present illness.   All other systems are personally reviewed and negative.  Vitals:   07/27/19 0932  BP: (!) 142/76  Pulse: 60  SpO2: 99%   Wt Readings from Last 3 Encounters:  07/27/19 117.1 kg (258 lb 3.2 oz)  07/23/19 112.9 kg (249 lb)  07/14/19 111.9 kg (246 lb 12.8 oz)   Reds Clip 48%  Exam:   General:  Well appearing. No resp difficulty HEENT: normal Neck: supple. JVP 11-12 . Carotids 2+ bilat; no bruits. No lymphadenopathy or thryomegaly appreciated. Cor: PMI nondisplaced. Irregular rate & rhythm. No rubs, gallops or murmurs. Lungs: clear Abdomen: soft, nontender, nondistended. No hepatosplenomegaly. No bruits or masses. Good bowel sounds. Extremities: no cyanosis, clubbing, rash, R and LLE 2+ edema. LUE AVF + bruit/thrill.  Neuro: alert & orientedx3, cranial nerves grossly intact. moves all 4 extremities w/o difficulty. Affect pleasant   Recent Labs: 05/03/2019: TSH 1.615 06/01/2019: B Natriuretic Peptide 246.0 07/04/2019: ALT 10 07/17/2019: Hemoglobin 10.9; Platelets 165 07/18/2019: BUN 97; Creatinine, Ser 1.41; Magnesium 2.6; Potassium 4.3; Sodium 140  Personally reviewed   Wt Readings from Last 3 Encounters:  07/27/19 117.1 kg (258 lb 3.2 oz)  07/23/19 112.9 kg (249 lb)  07/14/19 111.9 kg (246 lb 12.8 oz)    ASSESSMENT AND PLAN:  1. Chronic diastolic  CHF: With prominent RV failure/pulmonary hypertension. ?If RV failure is related to severe OSA, he was a remote smoker.  Echo in 3/20 with EF 60-65%, mildly dilated RV with mildly decreased systolic function, severe pulmonary hypertension.    Will need to be careful with diuresis given history of renal transplantation.  - NYHA  II-III. Volume status up suspect related to steroids and lower dose of torsemide. Reds Clip 48%.  - Increase torsemide to 60 mg twice a day x 2 days then back to torsemide to 40 mg twice a day.  Continue torsemide 40 mg twice a day.  Continue current lisinopril and spironolactone.  2. Atrial fibrillation: Chronic. Rate controlled.   He will continue Eliquis. Not on beta blocker with history of bradycardia.   3. CAD: S/p CABG 2005.  Low risk Cardiolite in 1/20.   - No ASA given Eliquis use.  - Continue Crestor 10 mg daily, good lipids in 7/19.  4. CKD: Stage 3.  S/p renal transplantation in 2012. He remains on mycophenolate and tacrolimus.   - I reviewed BMET from  Check BMET next visit 07/18/19. Stable.  5. OSA: Continue Bipap every night.  6. HTN: Stable.  7. Pulmonary hypertension: Severe pulmonary hypertension by 3/20 echo.  Given his gender, most likely this is a combination of group 2 (pulmonary venous hypertension) and group 3 (OSA, ?unrecognized OHS).  8. Gout Recent acute gout flare. Remains on prednisone.    Discussed medication changes.  Follow up in 2 weeks to reassess volume status, BMET, and Reds Clip.   Jeanmarie Hubert, NP  07/27/2019  [facility] [address] [name] [address] [phone] (office) [phone] (fax)

## 2019-07-27 NOTE — Telephone Encounter (Signed)
That is fine 

## 2019-07-27 NOTE — Telephone Encounter (Signed)
Esther with OT [facility] left v/m requesting verbal orders for [facility] OT 2 x a wk for 2 wks.

## 2019-07-29 ENCOUNTER — Other Ambulatory Visit: Payer: Self-pay | Admitting: Internal Medicine

## 2019-07-29 DIAGNOSIS — I82612 Acute embolism and thrombosis of superficial veins of left upper extremity: Secondary | ICD-10-CM | POA: Diagnosis not present

## 2019-07-29 DIAGNOSIS — M069 Rheumatoid arthritis, unspecified: Secondary | ICD-10-CM | POA: Diagnosis not present

## 2019-07-29 DIAGNOSIS — I2510 Atherosclerotic heart disease of native coronary artery without angina pectoris: Secondary | ICD-10-CM | POA: Diagnosis not present

## 2019-07-29 DIAGNOSIS — M1039 Gout due to renal impairment, multiple sites: Secondary | ICD-10-CM | POA: Diagnosis not present

## 2019-07-29 DIAGNOSIS — T82868D Thrombosis of vascular prosthetic devices, implants and grafts, subsequent encounter: Secondary | ICD-10-CM | POA: Diagnosis not present

## 2019-07-29 DIAGNOSIS — I5030 Unspecified diastolic (congestive) heart failure: Secondary | ICD-10-CM | POA: Diagnosis not present

## 2019-07-29 DIAGNOSIS — I130 Hypertensive heart and chronic kidney disease with heart failure and stage 1 through stage 4 chronic kidney disease, or unspecified chronic kidney disease: Secondary | ICD-10-CM | POA: Diagnosis not present

## 2019-07-29 DIAGNOSIS — E1122 Type 2 diabetes mellitus with diabetic chronic kidney disease: Secondary | ICD-10-CM | POA: Diagnosis not present

## 2019-07-29 DIAGNOSIS — N1831 Chronic kidney disease, stage 3a: Secondary | ICD-10-CM | POA: Diagnosis not present

## 2019-07-30 DIAGNOSIS — N183 Chronic kidney disease, stage 3 unspecified: Secondary | ICD-10-CM | POA: Diagnosis not present

## 2019-07-30 DIAGNOSIS — I10 Essential (primary) hypertension: Secondary | ICD-10-CM | POA: Diagnosis not present

## 2019-07-30 DIAGNOSIS — Z4822 Encounter for aftercare following kidney transplant: Secondary | ICD-10-CM | POA: Diagnosis not present

## 2019-07-30 DIAGNOSIS — Z940 Kidney transplant status: Secondary | ICD-10-CM | POA: Diagnosis not present

## 2019-07-30 DIAGNOSIS — D849 Immunodeficiency, unspecified: Secondary | ICD-10-CM | POA: Diagnosis not present

## 2019-07-30 DIAGNOSIS — Z5181 Encounter for therapeutic drug level monitoring: Secondary | ICD-10-CM | POA: Diagnosis not present

## 2019-07-30 DIAGNOSIS — Z79899 Other long term (current) drug therapy: Secondary | ICD-10-CM | POA: Diagnosis not present

## 2019-07-30 DIAGNOSIS — I130 Hypertensive heart and chronic kidney disease with heart failure and stage 1 through stage 4 chronic kidney disease, or unspecified chronic kidney disease: Secondary | ICD-10-CM | POA: Diagnosis not present

## 2019-08-02 DIAGNOSIS — I130 Hypertensive heart and chronic kidney disease with heart failure and stage 1 through stage 4 chronic kidney disease, or unspecified chronic kidney disease: Secondary | ICD-10-CM | POA: Diagnosis not present

## 2019-08-02 DIAGNOSIS — M1039 Gout due to renal impairment, multiple sites: Secondary | ICD-10-CM | POA: Diagnosis not present

## 2019-08-02 DIAGNOSIS — I82612 Acute embolism and thrombosis of superficial veins of left upper extremity: Secondary | ICD-10-CM | POA: Diagnosis not present

## 2019-08-02 DIAGNOSIS — N1831 Chronic kidney disease, stage 3a: Secondary | ICD-10-CM | POA: Diagnosis not present

## 2019-08-02 DIAGNOSIS — T82868D Thrombosis of vascular prosthetic devices, implants and grafts, subsequent encounter: Secondary | ICD-10-CM | POA: Diagnosis not present

## 2019-08-02 DIAGNOSIS — M069 Rheumatoid arthritis, unspecified: Secondary | ICD-10-CM | POA: Diagnosis not present

## 2019-08-02 DIAGNOSIS — E1122 Type 2 diabetes mellitus with diabetic chronic kidney disease: Secondary | ICD-10-CM | POA: Diagnosis not present

## 2019-08-02 DIAGNOSIS — I5030 Unspecified diastolic (congestive) heart failure: Secondary | ICD-10-CM | POA: Diagnosis not present

## 2019-08-02 DIAGNOSIS — I2510 Atherosclerotic heart disease of native coronary artery without angina pectoris: Secondary | ICD-10-CM | POA: Diagnosis not present

## 2019-08-03 DIAGNOSIS — M1039 Gout due to renal impairment, multiple sites: Secondary | ICD-10-CM | POA: Diagnosis not present

## 2019-08-03 DIAGNOSIS — T82868D Thrombosis of vascular prosthetic devices, implants and grafts, subsequent encounter: Secondary | ICD-10-CM | POA: Diagnosis not present

## 2019-08-03 DIAGNOSIS — M069 Rheumatoid arthritis, unspecified: Secondary | ICD-10-CM | POA: Diagnosis not present

## 2019-08-03 DIAGNOSIS — I5030 Unspecified diastolic (congestive) heart failure: Secondary | ICD-10-CM | POA: Diagnosis not present

## 2019-08-03 DIAGNOSIS — I82612 Acute embolism and thrombosis of superficial veins of left upper extremity: Secondary | ICD-10-CM | POA: Diagnosis not present

## 2019-08-03 DIAGNOSIS — N1831 Chronic kidney disease, stage 3a: Secondary | ICD-10-CM | POA: Diagnosis not present

## 2019-08-03 DIAGNOSIS — E1122 Type 2 diabetes mellitus with diabetic chronic kidney disease: Secondary | ICD-10-CM | POA: Diagnosis not present

## 2019-08-03 DIAGNOSIS — I2510 Atherosclerotic heart disease of native coronary artery without angina pectoris: Secondary | ICD-10-CM | POA: Diagnosis not present

## 2019-08-03 DIAGNOSIS — I130 Hypertensive heart and chronic kidney disease with heart failure and stage 1 through stage 4 chronic kidney disease, or unspecified chronic kidney disease: Secondary | ICD-10-CM | POA: Diagnosis not present

## 2019-08-04 ENCOUNTER — Other Ambulatory Visit
Admission: RE | Admit: 2019-08-04 | Discharge: 2019-08-04 | Disposition: A | Discharge status: Home / Self Care | Payer: 59 | Source: Ambulatory Visit | Attending: Internal Medicine | Admitting: Internal Medicine

## 2019-08-04 DIAGNOSIS — T82868D Thrombosis of vascular prosthetic devices, implants and grafts, subsequent encounter: Secondary | ICD-10-CM | POA: Diagnosis not present

## 2019-08-04 DIAGNOSIS — I10 Essential (primary) hypertension: Secondary | ICD-10-CM | POA: Insufficient documentation

## 2019-08-04 DIAGNOSIS — M1039 Gout due to renal impairment, multiple sites: Secondary | ICD-10-CM | POA: Diagnosis not present

## 2019-08-04 DIAGNOSIS — E1122 Type 2 diabetes mellitus with diabetic chronic kidney disease: Secondary | ICD-10-CM | POA: Diagnosis not present

## 2019-08-04 DIAGNOSIS — Z940 Kidney transplant status: Secondary | ICD-10-CM | POA: Insufficient documentation

## 2019-08-04 DIAGNOSIS — D849 Immunodeficiency, unspecified: Secondary | ICD-10-CM | POA: Insufficient documentation

## 2019-08-04 DIAGNOSIS — I130 Hypertensive heart and chronic kidney disease with heart failure and stage 1 through stage 4 chronic kidney disease, or unspecified chronic kidney disease: Secondary | ICD-10-CM | POA: Diagnosis not present

## 2019-08-04 DIAGNOSIS — I82612 Acute embolism and thrombosis of superficial veins of left upper extremity: Secondary | ICD-10-CM | POA: Diagnosis not present

## 2019-08-04 DIAGNOSIS — M069 Rheumatoid arthritis, unspecified: Secondary | ICD-10-CM | POA: Diagnosis not present

## 2019-08-04 DIAGNOSIS — I5030 Unspecified diastolic (congestive) heart failure: Secondary | ICD-10-CM | POA: Diagnosis not present

## 2019-08-04 DIAGNOSIS — N1831 Chronic kidney disease, stage 3a: Secondary | ICD-10-CM | POA: Diagnosis not present

## 2019-08-04 DIAGNOSIS — I2510 Atherosclerotic heart disease of native coronary artery without angina pectoris: Secondary | ICD-10-CM | POA: Diagnosis not present

## 2019-08-04 DIAGNOSIS — Z4822 Encounter for aftercare following kidney transplant: Secondary | ICD-10-CM | POA: Diagnosis not present

## 2019-08-04 LAB — COMPREHENSIVE METABOLIC PANEL
ALT: 19 U/L (ref 0–44)
AST: 17 U/L (ref 15–41)
Albumin: 3.4 g/dL — ABNORMAL LOW (ref 3.5–5.0)
Alkaline Phosphatase: 84 U/L (ref 38–126)
Anion gap: 11 (ref 5–15)
BUN: 44 mg/dL — ABNORMAL HIGH (ref 6–20)
CO2: 30 mmol/L (ref 22–32)
Calcium: 8.8 mg/dL — ABNORMAL LOW (ref 8.9–10.3)
Chloride: 99 mmol/L (ref 98–111)
Creatinine, Ser: 1.21 mg/dL (ref 0.61–1.24)
GFR calc Af Amer: >60 mL/min (ref >60)
GFR calc non Af Amer: >60 mL/min (ref >60)
Glucose, Bld: 140 mg/dL — ABNORMAL HIGH (ref 70–99)
Potassium: 3.3 mmol/L — ABNORMAL LOW (ref 3.5–5.1)
Sodium: 140 mmol/L (ref 135–145)
Total Bilirubin: 1 mg/dL (ref 0.3–1.2)
Total Protein: 6.7 g/dL (ref 6.5–8.1)

## 2019-08-04 LAB — PROTEIN / CREATININE RATIO, URINE
Creatinine, Urine: 62 mg/dL
Protein Creatinine Ratio: 0.15 mg/mg{Cre} (ref 0.00–0.15)
Total Protein, Urine: 9 mg/dL

## 2019-08-04 LAB — CBC WITH DIFFERENTIAL/PLATELET
Abs Immature Granulocytes: 0.07 10*3/uL (ref 0.00–0.07)
Basophils Absolute: 0.1 10*3/uL (ref 0.0–0.1)
Basophils Relative: 1 %
Eosinophils Absolute: 0.1 10*3/uL (ref 0.0–0.5)
Eosinophils Relative: 1 %
HCT: 36.3 % — ABNORMAL LOW (ref 39.0–52.0)
Hemoglobin: 11.6 g/dL — ABNORMAL LOW (ref 13.0–17.0)
Immature Granulocytes: 1 %
Lymphocytes Relative: 13 %
Lymphs Abs: 1.2 10*3/uL (ref 0.7–4.0)
MCH: 26.3 pg (ref 26.0–34.0)
MCHC: 32 g/dL (ref 30.0–36.0)
MCV: 82.3 fL (ref 80.0–100.0)
Monocytes Absolute: 0.4 10*3/uL (ref 0.1–1.0)
Monocytes Relative: 4 %
Neutro Abs: 7.1 10*3/uL (ref 1.7–7.7)
Neutrophils Relative %: 80 %
Platelets: 134 10*3/uL — ABNORMAL LOW (ref 150–400)
RBC: 4.41 MIL/uL (ref 4.22–5.81)
RDW: 18.6 % — ABNORMAL HIGH (ref 11.5–15.5)
WBC: 8.8 10*3/uL (ref 4.0–10.5)
nRBC: 0 % (ref 0.0–0.2)

## 2019-08-04 LAB — URINALYSIS, COMPLETE (UACMP) WITH MICROSCOPIC
Bacteria, UA: NONE SEEN
Bilirubin Urine: NEGATIVE
Glucose, UA: 150 mg/dL — AB
Hgb urine dipstick: NEGATIVE
Ketones, ur: NEGATIVE mg/dL
Leukocytes,Ua: NEGATIVE
Nitrite: NEGATIVE
Protein, ur: NEGATIVE mg/dL
Specific Gravity, Urine: 1.01 (ref 1.005–1.030)
Squamous Epithelial / HPF: NONE SEEN (ref 0–5)
WBC, UA: NONE SEEN WBC/hpf (ref 0–5)
pH: 5 (ref 5.0–8.0)

## 2019-08-04 LAB — MAGNESIUM: Magnesium: 1.6 mg/dL — ABNORMAL LOW (ref 1.7–2.4)

## 2019-08-04 LAB — PHOSPHORUS: Phosphorus: 3.3 mg/dL (ref 2.5–4.6)

## 2019-08-05 DIAGNOSIS — I82612 Acute embolism and thrombosis of superficial veins of left upper extremity: Secondary | ICD-10-CM | POA: Diagnosis not present

## 2019-08-05 DIAGNOSIS — I5030 Unspecified diastolic (congestive) heart failure: Secondary | ICD-10-CM | POA: Diagnosis not present

## 2019-08-05 DIAGNOSIS — I130 Hypertensive heart and chronic kidney disease with heart failure and stage 1 through stage 4 chronic kidney disease, or unspecified chronic kidney disease: Secondary | ICD-10-CM | POA: Diagnosis not present

## 2019-08-05 DIAGNOSIS — I2510 Atherosclerotic heart disease of native coronary artery without angina pectoris: Secondary | ICD-10-CM | POA: Diagnosis not present

## 2019-08-05 DIAGNOSIS — M1039 Gout due to renal impairment, multiple sites: Secondary | ICD-10-CM | POA: Diagnosis not present

## 2019-08-05 DIAGNOSIS — N1831 Chronic kidney disease, stage 3a: Secondary | ICD-10-CM | POA: Diagnosis not present

## 2019-08-05 DIAGNOSIS — M069 Rheumatoid arthritis, unspecified: Secondary | ICD-10-CM | POA: Diagnosis not present

## 2019-08-05 DIAGNOSIS — T82868D Thrombosis of vascular prosthetic devices, implants and grafts, subsequent encounter: Secondary | ICD-10-CM | POA: Diagnosis not present

## 2019-08-05 DIAGNOSIS — E1122 Type 2 diabetes mellitus with diabetic chronic kidney disease: Secondary | ICD-10-CM | POA: Diagnosis not present

## 2019-08-06 LAB — TACROLIMUS LEVEL: Tacrolimus (FK506) - LabCorp: 4.6 ng/mL (ref 2.0–20.0)

## 2019-08-10 ENCOUNTER — Encounter (HOSPITAL_COMMUNITY): Payer: 59

## 2019-08-10 DIAGNOSIS — I5030 Unspecified diastolic (congestive) heart failure: Secondary | ICD-10-CM | POA: Diagnosis not present

## 2019-08-10 DIAGNOSIS — I130 Hypertensive heart and chronic kidney disease with heart failure and stage 1 through stage 4 chronic kidney disease, or unspecified chronic kidney disease: Secondary | ICD-10-CM | POA: Diagnosis not present

## 2019-08-10 DIAGNOSIS — T82868D Thrombosis of vascular prosthetic devices, implants and grafts, subsequent encounter: Secondary | ICD-10-CM | POA: Diagnosis not present

## 2019-08-10 DIAGNOSIS — E1122 Type 2 diabetes mellitus with diabetic chronic kidney disease: Secondary | ICD-10-CM | POA: Diagnosis not present

## 2019-08-10 DIAGNOSIS — M1039 Gout due to renal impairment, multiple sites: Secondary | ICD-10-CM | POA: Diagnosis not present

## 2019-08-10 DIAGNOSIS — N1831 Chronic kidney disease, stage 3a: Secondary | ICD-10-CM | POA: Diagnosis not present

## 2019-08-10 DIAGNOSIS — M069 Rheumatoid arthritis, unspecified: Secondary | ICD-10-CM | POA: Diagnosis not present

## 2019-08-10 DIAGNOSIS — I82612 Acute embolism and thrombosis of superficial veins of left upper extremity: Secondary | ICD-10-CM | POA: Diagnosis not present

## 2019-08-10 DIAGNOSIS — I2510 Atherosclerotic heart disease of native coronary artery without angina pectoris: Secondary | ICD-10-CM | POA: Diagnosis not present

## 2019-08-11 ENCOUNTER — Telehealth: Payer: Self-pay | Admitting: Cardiovascular Disease

## 2019-08-11 ENCOUNTER — Other Ambulatory Visit: Payer: Self-pay | Admitting: Internal Medicine

## 2019-08-11 NOTE — Telephone Encounter (Signed)
Pt c/o swelling: STAT is pt has developed SOB within 24 hours  1) How much weight have you gained and in what time span? Up 6 1/2 pounds  2) If swelling, where is the swelling located? Stomach and legs   3) Are you currently taking a fluid pill? yes  4) Are you currently SOB? no  5) Do you have a log of your daily weights (if so, list)?   6) Have you gained 3 pounds in a day or 5 pounds in a week?   7) Have you traveled recently?   Please call to discuss what recommendation Dr Fletcher Anon may have

## 2019-08-11 NOTE — Telephone Encounter (Signed)
Spoke with the patient. Patient sts that he has had a weight gain of 6 and 1/2 lbs in the last week. Patient denies any changes in diet (no increased salt or fluid intake). Patient reports increased LE swelling and tightness in his abdomen. He does not report increased sob.  Patient has increased his Torsemide on his on for the last couple of days taking Torsemide 60 mg ithe am and 40 mg in the pm.Patient sts that even with the increase his weight has continued to increase and he has not noticed any increase in his urine out put.  Advised the patient that I would recommend that he be seen for evaluation. Patient sts that he is currently out of town but will be returning home this evening. Appt scheduled with Laurann Montana, NP on 08/12/19 @ 9:30am. Patient aware of the appt date and time.

## 2019-08-12 ENCOUNTER — Ambulatory Visit (INDEPENDENT_AMBULATORY_CARE_PROVIDER_SITE_OTHER): Payer: 59 | Admitting: Family

## 2019-08-12 ENCOUNTER — Encounter: Payer: Self-pay | Admitting: Family

## 2019-08-12 ENCOUNTER — Other Ambulatory Visit: Payer: Self-pay

## 2019-08-12 VITALS — BP 140/73 | HR 83 | Ht 70.5 in | Wt 255.2 lb

## 2019-08-12 DIAGNOSIS — G4733 Obstructive sleep apnea (adult) (pediatric): Secondary | ICD-10-CM | POA: Diagnosis not present

## 2019-08-12 DIAGNOSIS — I2722 Pulmonary hypertension due to left heart disease: Secondary | ICD-10-CM | POA: Diagnosis not present

## 2019-08-12 DIAGNOSIS — I2510 Atherosclerotic heart disease of native coronary artery without angina pectoris: Secondary | ICD-10-CM | POA: Diagnosis not present

## 2019-08-12 DIAGNOSIS — I10 Essential (primary) hypertension: Secondary | ICD-10-CM | POA: Diagnosis not present

## 2019-08-12 DIAGNOSIS — I5032 Chronic diastolic (congestive) heart failure: Secondary | ICD-10-CM

## 2019-08-12 MED ORDER — METOLAZONE 2.5 MG PO TABS
2.5000 mg | ORAL_TABLET | Freq: Every day | ORAL | 0 refills | Status: DC
Start: 2019-08-12 — End: 2019-08-16

## 2019-08-12 NOTE — Progress Notes (Signed)
Office Visit    Patient Name: [name]. Date of Encounter: 08/12/2019  Primary Care Provider:  [name], MD Primary Cardiologist:  [name], MD Electrophysiologist:  None   Chief Complaint    Carl Romaine Neville. is a 59 y.o. male with a hx of ESRD s/p kidney transplant 2012 now with CKD3, chronic diastolic heart failure, CAD s/p CABG presents today for lower extremity edema.   Past Medical History    Past Medical History:  Diagnosis Date  . Allergy    seasonal  . Amnestic MCI (mild cognitive impairment with memory loss)   . Asthma   . Benign neoplasm of colon   . CAD (coronary artery disease) 2005   a.  Status post four-vessel CABG in 2005; b. 03/2018 MV: Small, fixed inferoapical and apical septal defect. No ischemia. EF 47% (60-65% by 05/2018 Echo).  . Chronic atrial fibrillation ([place])    a. CHADS2VASc 4 (CHF, HTN, DM, vascular disease)--> Eliquis; b. 09/2018 Zio: Chronic Afib.  . Chronic combined systolic and diastolic CHF (congestive heart failure) ([place])    a.  7/18 Echo: EF 45-50%; b. 05/2018 Echo: EF 60-65%, RVSP 74.8; c. 12/2018 Echo: EF 50-55%. Sev dil LA.  Marland Kitchen Chronic venous stasis dermatitis of both lower extremities   . Cytomegaloviral disease ([place]) 2017  . Depression   . Diabetes mellitus   . Diverticulosis   . Dyspnea   . Erectile dysfunction   . ESRD (end stage renal disease) ([place]) 2005   a. HD 2004-2012; b. S/p renal transplant in 2012  . GERD (gastroesophageal reflux disease)   . Gout   . Hyperlipidemia    low since renal failure  . Hypertension   . Kidney transplant status, cadaveric 2012  . Obesity   . PAH (pulmonary artery hypertension) ([place])    a. 05/2018 Echo: RVSP 74.65mHg; b. 12/2018 RV not well visualized.  . Pneumonia   . [place]   . Sleep apnea    bipap with oxygen 2 l  . Trigger finger   . Ulcer 05/2016   Left shin  . Wears glasses    Past Surgical History:  Procedure Laterality Date  . BARIATRIC  SURGERY    . CARDIAC CATHETERIZATION     [facility]  . CARDIAC CATHETERIZATION  05/03/2019  . CATARACT EXTRACTION W/PHACO Right 10/29/2017   Procedure: CATARACT EXTRACTION PHACO AND INTRAOCULAR LENS PLACEMENT ([place]  RIGHT DIABETIC;  Surgeon: BLeandrew Koyanagi MD;  [place]  Service: Ophthalmology;  Laterality: Right;  Diabetic - insulin  . CATARACT EXTRACTION W/PHACO Left 11/18/2017   Procedure: CATARACT EXTRACTION PHACO AND INTRAOCULAR LENS PLACEMENT ([place] LEFT IVA/TOPICAL;  Surgeon: BLeandrew Koyanagi MD;  [place]  Service: Ophthalmology;  Laterality: Left;  DIABETES - insulin sleep apnea  . COLONOSCOPY WITH PROPOFOL N/A 04/22/2013   Procedure: COLONOSCOPY WITH PROPOFOL;  Surgeon: DMilus Banister MD;  [facility];  Service: Endoscopy;  Laterality: N/A;  . CORONARY ARTERY BYPASS GRAFT  2005   X 4  . DG ANGIO AV SHUNT*L*     right and left upper arms  . FASCIOTOMY  03/03/2012   Procedure: FASCIOTOMY;  Surgeon: GWynonia Sours MD;  [place] [facility]  Service: Orthopedics;  Laterality: Right;  FASCIOTOMY RIGHT SMALL FINGER  . FASCIOTOMY Left 08/17/2013   Procedure: FASCIOTOMY LEFT RING;  Surgeon: GWynonia Sours MD;  [place]  Service: Orthopedics;  Laterality: Left;  .  INCISION AND DRAINAGE ABSCESS Left 10/15/2015   Procedure: INCISION AND DRAINAGE ABSCESS;  Surgeon: [name], MD;  Location: [facility];  Service: General;  Laterality: Left;  . KIDNEY TRANSPLANT  09/13/2010   cadaver--at [facility]  . RIGHT HEART CATH N/A 11/15/2016   Procedure: RIGHT HEART CATH;  Surgeon: [name], MD;  Location: [place] [facility];  Service: Cardiovascular;  Laterality: N/A;  . RIGHT HEART CATH N/A 05/03/2019   Procedure: RIGHT HEART CATH;  Surgeon: [name], MD;  Location: [place] [facility];  Service: Cardiovascular;  Laterality: N/A;  . TYMPANIC MEMBRANE REPAIR  1/12   left  . VASECTOMY       Allergies  Allergies  Allergen Reactions  . Acetaminophen Other (See Comments)    BC of transplant  . Contrast Media [Iodinated Diagnostic Agents]     Kidney transplant  . Ibuprofen Other (See Comments)    Due to kidney transplant Due to kidney transplant Due to kidney transplant    History of Present Illness    [name]. is a 59 y.o. male with a hx of CAD s/p CABG 2005, chronic atrial fibrillation on Eliquis, ESRD s/p renal transplant 2012, chronic combined systolic and diastolic heart failure, ischemic cardiomyopathy LVEF as low as 35-40% with improvement by echo 05/2018 to LVEF 60-65%, RV failure with pulmonary hypertension, polyarticular gout followed by rheumatology, OSA on BiPAP. He was last seen in HF clinic by Darrick Grinder, NP on 07/27/19.  Admitted December 2020 with heart failure exacerbation.  Admission weight 279 pounds.  03/25/2019 echo with moderate LVE, mild LVH, low normal LV EF 50-55%, abnormal septal wall motion, moderate LAE, mild RAE, mild AS, RA pressure 50 mmHg.  Discharged with IV Lasix.  Admitted 05/03/19 with acute on chronic diastolic heart failure exacerbation.  Diuresed with IV Lasix and transitioned to torsemide 80/40 mg with discharge weight of 252 pounds.  [place] 05/03/19 performed by [name] with severely elevated right and left heart filling pressures, severe pulmonary hypertension likely primary pulmonary venous hypertension, and preserved cardiac output.  Echo 05/03/2019 LVEF 45-50%, LV mildly dilated, RV systolic function moderately reduced with estimated RV systolic pressure 16.3 mmHg, moderately elevated PASP  Admitted 06/2019 with Covid pneumonia.  Treated with steroids and 5 doses of remdesivir.  Seen initially as virtual visit 07/12/2019 for chest pain and transition in office visit.  Significant precordial chest pain that was tender to palpation.  Recommended to proceed to the ED for pain control and subsequently admitted. Left extremity venous  Doppler with occluded cephalic vein in the AV fistula outflow tract in the antecubital fossa with no other thrombus identified.  Vascular evaluated, he was continued on Eliquis, no plan for intervention.  He was also treated for acute gout flare and discharged on prednisone taper.   Seen by heart failure clinic in [facility] 07/27/2019 feeling overall well.  His home weight was 248 pounds.  He was mildly short of breath with exertion without PND/orthopnea.  He was NYHA II-3 with Reds vest reading of 48% in volume since up status likely related to steroids.  His torsemide was increased to 60 mg twice a day for 2 days then back to 40 mg twice daily.  Seen by rheumatology at [facility] 07/27/19 and started on prednisone for polyarticular gout.   Seen by nephrology at [facility] 07/30/19, no changes were made.   Called office noting weight gain of 6.5lbs in the last week with associated LE edema  and tightness in his abdomen without shortness of breath.  He was scheduled for an office evaluation.  Tells me he has noticed his weight gain and increased abdominal swelling since Monday.  He self-increased his Torsemide to 47m AM and 450mPM beginning Monday but has not noticed much of a response.  Tells me his dry weight is approximately 248 pounds.  Blood pressure at home with large range of 120-160/70-80.  No noted changes in shortness of breath but does note that his BiPAP is felt more constricted for the last few days.  We discussed possible triggers for fluid retention.  He is maintaining a low-sodium diet.  Tells me he only drinks approximately 3-4 water bottles per day, no excessive fluid intake.  He has been off   EKGs/Labs/Other Studies Reviewed:   The following studies were reviewed today: Echo 05/03/2019  1. Left ventricular ejection fraction, by estimation, is 45 to 50%. The  left ventricle has mildly decreased function. The left ventrical  demonstrates global hypokinesis. The left ventricular internal  cavity size  was mildly dilated. Left ventricular  diastolic parameters are indeterminate.   2. Right ventricular systolic function is moderately reduced. The right  ventricular size is moderately enlarged. There is moderately elevated  pulmonary artery systolic pressure. The estimated right ventricular  systolic pressure is 5332.3mHg. D-shaped  interventricular septum suggests RV pressure/volume overload.   3. The aortic valve is tricuspid. No significant regurgitation or  stenosis.   4. The mitral valve is degenerative. No significant regurgitation or  stenosis.   5. Dilated IVC suggestive of RA pressure 15 mmHg.   Right heart cath 05/03/2019 1. Severely elevated right and left heart filling pressures.  2. Severe pulmonary hypertension, likely primarily pulmonary venous hypertension.  3. Preserved cardiac output.   EKG:  EKG is ordered today.  The ekg ordered today demonstrates rate controlled atrial fibrillation 83 bpm with LPF B and no acute ST/T wave changes.  Recent Labs: 05/03/2019: TSH 1.615 06/01/2019: B Natriuretic Peptide 246.0 08/04/2019: ALT 19; BUN 44; Creatinine, Ser 1.21; Hemoglobin 11.6; Magnesium 1.6; Platelets 134; Potassium 3.3; Sodium 140  Recent Lipid Panel    Component Value Date/Time   CHOL 89 10/09/2017 0000   CHOL 161 03/07/2015 0823   TRIG 73 03/06/2016 0736   HDL 34 (L) 03/06/2016 0736   HDL 38 (L) 03/07/2015 0823   CHOLHDL 2.7 03/06/2016 0736   VLDL 15 03/06/2016 0736   LDLCALC 49 10/09/2017 0000   LDLCALC 105 (H) 03/07/2015 0823   LDLDIRECT 41.9 04/20/2012 1618    Home Medications   No outpatient medications have been marked as taking for the 08/12/19 encounter (Appointment) with WaLoel DubonnetNP.    Review of Systems      Review of Systems  Constitution: Negative for chills, fever and malaise/fatigue.  Cardiovascular: Positive for dyspnea on exertion and leg swelling. Negative for chest pain, irregular heartbeat, near-syncope, orthopnea,  palpitations and syncope.  Respiratory: Negative for cough, shortness of breath and wheezing.   Gastrointestinal: Positive for bloating. Negative for melena, nausea and vomiting.  Genitourinary: Negative for hematuria.  Neurological: Negative for dizziness, light-headedness and weakness.   All other systems reviewed and are otherwise negative except as noted above.  Physical Exam    VS:  There were no vitals taken for this visit. , BMI There is no height or weight on file to calculate BMI. GEN: Well nourished, overweight, well developed, in no acute distress. HEENT: normal. Neck: Supple, no carotid  bruits, or masses. Cardiac: Irregularly irregular, no murmurs, rubs, or gallops. No clubbing, cyanosis.  Bilateral lower extremity with 2+ pitting edema.  Abdomen with swelling but no pitting edema.  Radials/PT 2+ and equal bilaterally.  Respiratory:  Respirations regular and unlabored, clear to auscultation bilaterally. GI: Soft, nontender, nondistended. MS: No deformity or atrophy. Skin: Warm and dry, no rash. Neuro:  Strength and sensation are intact. Psych: Normal affect.  Assessment & Plan    1. Chronic diastolic heart failure - Weight gain of 6.5 lbs over the last week. Self increased his dose of Torsemide to 79m AM and 487mPM for the last 3 days with no improvement. Reds vest today 54% (most recent reading 07/27/19 of 48%). Noted abdominal bloating and 2+ LE edema on exam. Endorses low sodium diet and no excessive fluid intake. Has been off previous prednisone for a few weeks.   Start metolazone 2.5 mg for 3 days. He will take 30 minutes prior to morning torsemide.  Reduce torsemide to 40 mg twice daily for protection of renal function.  Follow up Monday with HF clinic as previously scheduled.   2. Permanent atrial fibrillation -no beta-blocker secondary to history of bradycardia.  Rate controlled by EKG today.  Continue anticoagulation with Eliquis 5 mg twice daily.  Denies bleeding  complications.  3. CAD s/p CABG -he had a low risk stress test January 2020.  No symptoms concerning for angina.  No indication for ischemic evaluation at this time.  GDMT includes statin.  No aspirin secondary to chronic anticoagulation.  No beta-blocker due to history of bradycardia.  4. HLD, LDL goal less than 70 -continue Crestor 10 mg daily.  5. CKD 3 -s/p renal transplant 2012.  Careful titration of antihypertensives and diuretics. Renal function 08/04/19 with creatinine 1.21 and GFR >60. Anticipate repeat BMET Monday for monitoring.   6. OSA -continue BiPAP.  Reports compliance.  7. HTN -blood pressure reasonably controlled today.  Mildly elevated in clinic likely in setting of fluid volume overload.  Continue amlodipine 5 mg daily, lisinopril 20 mg daily.  8. Pulmonary hypertension -severe pulmonary hypertension by echo 05/2018.  9. Gout -follows with rheumatology.  Presently off prednisone.  Disposition: Follow up on Monday with Dr. McAundra Dubins previously scheduled  CaLoel DubonnetNP 08/12/2019, 7:36 AM

## 2019-08-12 NOTE — Patient Instructions (Addendum)
Medication Instructions:  Your physician has recommended you make the following change in your medication:   CHANGE Torsemide to 37m twice daily  START Metolazone 2.566mdaily for 3 days. Take 30 minutes prior to your morning Torsemide.   *If you need a refill on your cardiac medications before your next appointment, please call your pharmacy*  Lab Work: None today  Testing/Procedures: EKG today shows rate controlled atrial fibrillation.   Follow-Up: [facility] our [facility].  As part of our continuing mission to provide you with exceptional heart care, we have created designated Provider Care Teams.  These Care Teams include your primary Cardiologist (physician) and Advanced Practice Providers (APPs -  Physician Assistants and Nurse Practitioners) who all work together to provide you with the care you need, when you need it.  We recommend signing up for the patient portal called "MyChart".  Sign up information is provided on this After Visit Summary.  MyChart is used to connect with patients for Virtual Visits (Telemedicine).  Patients are able to view lab/test results, encounter notes, upcoming appointments, etc.  Non-urgent messages can be sent to your provider as well.   To learn more about what you can do with MyChart, go to [email or web address]   Your next appointment:   As previously scheduled.

## 2019-08-16 ENCOUNTER — Other Ambulatory Visit: Payer: Self-pay

## 2019-08-16 ENCOUNTER — Other Ambulatory Visit (HOSPITAL_COMMUNITY): Payer: Self-pay | Admitting: Cardiology

## 2019-08-16 ENCOUNTER — Encounter (HOSPITAL_COMMUNITY): Payer: Self-pay | Admitting: Cardiology

## 2019-08-16 ENCOUNTER — Ambulatory Visit (HOSPITAL_COMMUNITY)
Admission: RE | Admit: 2019-08-16 | Discharge: 2019-08-16 | Disposition: A | Discharge status: Home / Self Care | Payer: 59 | Source: Ambulatory Visit | Attending: Cardiology | Admitting: Cardiology

## 2019-08-16 VITALS — BP 126/84 | HR 71 | Wt 265.4 lb

## 2019-08-16 DIAGNOSIS — Z9842 Cataract extraction status, left eye: Secondary | ICD-10-CM | POA: Insufficient documentation

## 2019-08-16 DIAGNOSIS — I482 Chronic atrial fibrillation, unspecified: Secondary | ICD-10-CM | POA: Diagnosis not present

## 2019-08-16 DIAGNOSIS — E1122 Type 2 diabetes mellitus with diabetic chronic kidney disease: Secondary | ICD-10-CM | POA: Diagnosis not present

## 2019-08-16 DIAGNOSIS — G4733 Obstructive sleep apnea (adult) (pediatric): Secondary | ICD-10-CM | POA: Insufficient documentation

## 2019-08-16 DIAGNOSIS — Z940 Kidney transplant status: Secondary | ICD-10-CM | POA: Diagnosis not present

## 2019-08-16 DIAGNOSIS — Z7901 Long term (current) use of anticoagulants: Secondary | ICD-10-CM | POA: Diagnosis not present

## 2019-08-16 DIAGNOSIS — M109 Gout, unspecified: Secondary | ICD-10-CM | POA: Insufficient documentation

## 2019-08-16 DIAGNOSIS — Z9841 Cataract extraction status, right eye: Secondary | ICD-10-CM | POA: Diagnosis not present

## 2019-08-16 DIAGNOSIS — Z8249 Family history of ischemic heart disease and other diseases of the circulatory system: Secondary | ICD-10-CM | POA: Diagnosis not present

## 2019-08-16 DIAGNOSIS — Z79899 Other long term (current) drug therapy: Secondary | ICD-10-CM | POA: Diagnosis not present

## 2019-08-16 DIAGNOSIS — E669 Obesity, unspecified: Secondary | ICD-10-CM | POA: Insufficient documentation

## 2019-08-16 DIAGNOSIS — Z886 Allergy status to analgesic agent status: Secondary | ICD-10-CM | POA: Insufficient documentation

## 2019-08-16 DIAGNOSIS — Z841 Family history of disorders of kidney and ureter: Secondary | ICD-10-CM | POA: Diagnosis not present

## 2019-08-16 DIAGNOSIS — Z803 Family history of malignant neoplasm of breast: Secondary | ICD-10-CM | POA: Insufficient documentation

## 2019-08-16 DIAGNOSIS — Z833 Family history of diabetes mellitus: Secondary | ICD-10-CM | POA: Insufficient documentation

## 2019-08-16 DIAGNOSIS — I5032 Chronic diastolic (congestive) heart failure: Secondary | ICD-10-CM | POA: Insufficient documentation

## 2019-08-16 DIAGNOSIS — Z9884 Bariatric surgery status: Secondary | ICD-10-CM | POA: Insufficient documentation

## 2019-08-16 DIAGNOSIS — Z91041 Radiographic dye allergy status: Secondary | ICD-10-CM | POA: Insufficient documentation

## 2019-08-16 DIAGNOSIS — I2510 Atherosclerotic heart disease of native coronary artery without angina pectoris: Secondary | ICD-10-CM | POA: Diagnosis not present

## 2019-08-16 DIAGNOSIS — Z6837 Body mass index (BMI) 37.0-37.9, adult: Secondary | ICD-10-CM | POA: Insufficient documentation

## 2019-08-16 DIAGNOSIS — N183 Chronic kidney disease, stage 3 unspecified: Secondary | ICD-10-CM | POA: Insufficient documentation

## 2019-08-16 DIAGNOSIS — Z794 Long term (current) use of insulin: Secondary | ICD-10-CM | POA: Diagnosis not present

## 2019-08-16 DIAGNOSIS — Z7951 Long term (current) use of inhaled steroids: Secondary | ICD-10-CM | POA: Diagnosis not present

## 2019-08-16 DIAGNOSIS — I255 Ischemic cardiomyopathy: Secondary | ICD-10-CM | POA: Insufficient documentation

## 2019-08-16 DIAGNOSIS — I272 Pulmonary hypertension, unspecified: Secondary | ICD-10-CM | POA: Insufficient documentation

## 2019-08-16 DIAGNOSIS — I130 Hypertensive heart and chronic kidney disease with heart failure and stage 1 through stage 4 chronic kidney disease, or unspecified chronic kidney disease: Secondary | ICD-10-CM | POA: Diagnosis not present

## 2019-08-16 DIAGNOSIS — Z87891 Personal history of nicotine dependence: Secondary | ICD-10-CM | POA: Insufficient documentation

## 2019-08-16 DIAGNOSIS — Z951 Presence of aortocoronary bypass graft: Secondary | ICD-10-CM | POA: Insufficient documentation

## 2019-08-16 DIAGNOSIS — Z961 Presence of intraocular lens: Secondary | ICD-10-CM | POA: Insufficient documentation

## 2019-08-16 DIAGNOSIS — J45909 Unspecified asthma, uncomplicated: Secondary | ICD-10-CM | POA: Insufficient documentation

## 2019-08-16 DIAGNOSIS — I2722 Pulmonary hypertension due to left heart disease: Secondary | ICD-10-CM | POA: Diagnosis not present

## 2019-08-16 DIAGNOSIS — E785 Hyperlipidemia, unspecified: Secondary | ICD-10-CM | POA: Insufficient documentation

## 2019-08-16 LAB — BASIC METABOLIC PANEL
Anion gap: 11 (ref 5–15)
BUN: 59 mg/dL — ABNORMAL HIGH (ref 6–20)
CO2: 28 mmol/L (ref 22–32)
Calcium: 9.3 mg/dL (ref 8.9–10.3)
Chloride: 100 mmol/L (ref 98–111)
Creatinine, Ser: 1.61 mg/dL — ABNORMAL HIGH (ref 0.61–1.24)
GFR calc Af Amer: 53 mL/min — ABNORMAL LOW (ref >60)
GFR calc non Af Amer: 46 mL/min — ABNORMAL LOW (ref >60)
Glucose, Bld: 190 mg/dL — ABNORMAL HIGH (ref 70–99)
Potassium: 3.1 mmol/L — ABNORMAL LOW (ref 3.5–5.1)
Sodium: 139 mmol/L (ref 135–145)

## 2019-08-16 MED ORDER — TORSEMIDE 20 MG PO TABS: 80.0000 mg | ORAL_TABLET | Freq: Two times a day (BID) | ORAL | 4 refills | Status: DC

## 2019-08-16 MED ORDER — METOLAZONE 2.5 MG PO TABS: 2.5000 mg | ORAL_TABLET | ORAL | 5 refills | Status: DC

## 2019-08-16 NOTE — Progress Notes (Signed)
Heart Failure Note   Date:  08/16/2019   ID:  [name]., DOB 02/17/1961, MRN [id number]   Provider location: [address], [place] Type of Visit:  Established patient  PCP:  [name], MD  Cardiologist:  [name] Primary HF: [name]   History of Present Illness: Omkar Jarnell Cordaro. is a 59 y.o. male  with a history of CAD s/p CABG 2005, chronic atrial fibrillation on Eliquis, ESRD s/p renal transplant 2012, anc chronic diastolic CHF.  His renal function has been fairly stable, most recent creatinine 1.43.  He has been struggling with volume overload/CHF.  He has a history of ischemic cardiomyopathy with EF as low as 35-40% in the past but last echo in 3/20 showed EF 60-65%.  There is evidence for RV failure with pulmonary hypertension by echo.    Admitted 11/05/18 low grade fever and shortness of breath. HS Trop negative. Covid 19 negative. Blood CX negative. Placed on antibiotics and discharged to home the next day.   Admitted 09/23/14 with A/C Diastolic HF. Diuresed with IV lasix and transitioned to torsemide 80/40 mg . Discharge weight 252 pounds.   Admitted 06/2019 with left arm pain. Left upper extremity venous Doppler revealed occluded cephalic vein at the AV fistula outflow tract in the antecubital fossa with no other thrombus identified. Vascular evaluated. He continued on eliquis.  No plan for intervention. Also treated for acute gout flare. Discharged on prednisone taper.    Patient seen today for followup of CHF.  He is back on prednisone for gout. Developing leg and abdominal swelling again.  Weight up 7 lbs.  On Friday, torsemide was increased to 60 mg bid but has not helped much.  No dyspnea walking on flat ground.  He is able to mow his lawn but it takes longer.  No chest pain.  No lightheadedness.  Wearing Bipap every night.   Labs (7/19): LDL 49 Labs (2/20): K 3.7, creatinine 1.43 Labs (4/20): K 4.3, creatinine 1.24 Labs  (11/06/18): K 3.7 creatinine 1.23  Labs (05/07/19): K 3.8 Creatinine 1.5  Labs (07/18/19): K 4.3 Creatinine 1.42  Labs (5/21): K 3.3, creatinine 1.21  REDS clip 49%  PMH: 1. CAD: s/p CABG x 4 in [place] in 2005.  - Cardiolite (1/20): EF 47%, small fixed apical septal defect, no ischemia.  Low risk study.  2. Asthma 3. Gout 4. Atrial fibrillation: Chronic.  5. ESRD s/p renal transplantation at [facility] in 2012.  6. Anemia of chronic disease 7. Type II diabetes 8. HTN 9. Hyperlipidemia 10. OSA: On bipap.  11. Ischemic cardiomyopathy: EF 35-40% in past.  - Cardiolite (1/20) with EF 47%.  - Echo (1/20): EF 50-55%, mild MR, RV moderately dilated with moderately decreased systolic function, PASP 73 mmHg, moderate TR.   - Echo (3/20): EF 60-65%, PASP 75 mmHg, mildly dilated RV with mildly decreased systolic function.  - Echo (2/21): EF 45-50%, global HK, moderately dilated RV with moderately decreased systolic function, D-shaped interventricular septum, dilated IVC.  - RHC (2/21): mean RA 19, PA 78/31 mean 51, mean PCWP 26, CI 3.26, PVR 3.25 12. Obesity: s/p gastric bypass in 2015.  13. Bradycardia with beta blocker.   Past Surgical History:  Procedure Laterality Date  . BARIATRIC SURGERY    . CARDIAC CATHETERIZATION     [facility]  . CARDIAC CATHETERIZATION  05/03/2019  . CATARACT EXTRACTION W/PHACO Right 10/29/2017   Procedure: CATARACT EXTRACTION PHACO AND INTRAOCULAR  LENS PLACEMENT (I[facility])  RIGHT DIABETIC;  Surgeon: [name], MD;  Location: [place];  Service: Ophthalmology;  Laterality: Right;  Diabetic - insulin  . CATARACT EXTRACTION W/PHACO Left 11/18/2017   Procedure: CATARACT EXTRACTION PHACO AND INTRAOCULAR LENS PLACEMENT ([place]) LEFT IVA/TOPICAL;  Surgeon: [name], MD;  Location: [place];  Service: Ophthalmology;  Laterality: Left;  DIABETES - insulin sleep apnea  . COLONOSCOPY WITH PROPOFOL N/A 04/22/2013   Procedure: COLONOSCOPY WITH  PROPOFOL;  Surgeon: [name], MD;  Location: [facility];  Service: Endoscopy;  Laterality: N/A;  . CORONARY ARTERY BYPASS GRAFT  2005   X 4  . DG ANGIO AV SHUNT*L*     right and left upper arms  . FASCIOTOMY  03/03/2012   Procedure: FASCIOTOMY;  Surgeon: [name], MD;  Location: [place];  Service: Orthopedics;  Laterality: Right;  FASCIOTOMY RIGHT SMALL FINGER  . FASCIOTOMY Left 08/17/2013   Procedure: FASCIOTOMY LEFT RING;  Surgeon: [name], MD;  Location: [place];  Service: Orthopedics;  Laterality: Left;  . INCISION AND DRAINAGE ABSCESS Left 10/15/2015   Procedure: INCISION AND DRAINAGE ABSCESS;  Surgeon: [name], MD;  Location: [facility];  Service: General;  Laterality: Left;  . KIDNEY TRANSPLANT  09/13/2010   cadaver--at [facility]  . RIGHT HEART CATH N/A 11/15/2016   Procedure: RIGHT HEART CATH;  Surgeon: [name], MD;  Location: [place] [facility];  Service: Cardiovascular;  Laterality: N/A;  . RIGHT HEART CATH N/A 05/03/2019   Procedure: RIGHT HEART CATH;  Surgeon: [name], MD;  Location: [place] [facility];  Service: Cardiovascular;  Laterality: N/A;  . TYMPANIC MEMBRANE REPAIR  1/12   left  . VASECTOMY       Current Outpatient Medications  Medication Sig Dispense Refill  . allopurinol (ZYLOPRIM) 100 MG tablet Take 0.5 tablets (50 mg total) by mouth daily. 30 tablet 1  . amLODipine (NORVASC) 5 MG tablet Take 1 tablet (5 mg total) by mouth daily. 90 tablet 2  . apixaban (ELIQUIS) 5 MG TABS tablet Take 1 tablet (5 mg total) by mouth 2 (two) times daily. 180 tablet 1  . beclomethasone (QVAR) 80 MCG/ACT inhaler Inhale 2 puffs into the lungs 2 (two) times daily.    . benzonatate (TESSALON) 200 MG capsule Take 1 capsule (200 mg total) by mouth 3 (three) times daily as needed for cough. 60 capsule 0  . buPROPion (WELLBUTRIN SR) 150 MG 12 hr tablet Take 150 mg by mouth 2 (two) times daily.    . dapagliflozin  propanediol (FARXIGA) 10 MG TABS tablet Take 10 mg by mouth daily before breakfast. 30 tablet 6  . fluticasone (FLONASE) 50 MCG/ACT nasal spray PLACE 2 SPRAYS INTO BOTH NOSTRILS DAILY. 16 g 11  . gabapentin (NEURONTIN) 300 MG capsule Take 300 mg by mouth at bedtime.     Marland Kitchen HUMALOG KWIKPEN 100 UNIT/ML KwikPen Inject 5-10 Units into the skin 3 (three) times daily. Sliding scale    . insulin glargine (LANTUS) 100 unit/mL SOPN Inject 28 Units into the skin at bedtime.    Marland Kitchen lisinopril (ZESTRIL) 40 MG tablet Take 0.5 tablets (20 mg total) by mouth daily. 90 tablet 3  . metolazone (ZAROXOLYN) 2.5 MG tablet Take 1 tablet (2.5 mg total) by mouth once a week. 6 tablet 5  . montelukast (SINGULAIR) 10 MG tablet Take 10 mg by mouth at bedtime.    . Multiple Vitamin (MULTIVITAMIN WITH  MINERALS) TABS tablet Take 1 tablet by mouth daily.    . mycophenolate (MYFORTIC) 180 MG EC tablet Take 360 mg by mouth 2 (two) times daily.    Marland Kitchen omeprazole (PRILOSEC) 20 MG capsule Take 20 mg by mouth daily.    . potassium chloride (MICRO-K) 10 MEQ CR capsule Take 4 capsules (40 mEq total) by mouth every morning AND 2 capsules (20 mEq total) every evening. 360 capsule 4  . rosuvastatin (CRESTOR) 10 MG tablet Take 10 mg by mouth daily.    . tacrolimus (PROGRAF) 1 MG capsule Take by mouth 2 (two) times daily. 3 mg in AM and 79m in PM    . tamsulosin (FLOMAX) 0.4 MG CAPS capsule Take 0.4 mg by mouth daily.     .Marland Kitchentorsemide (DEMADEX) 20 MG tablet Take 4 tablets (80 mg total) by mouth 2 (two) times daily. 240 tablet 4  . vitamin C (ASCORBIC ACID) 250 MG tablet Take 1 tablet (250 mg total) by mouth daily. 30 tablet 0  . zinc sulfate 220 (50 Zn) MG capsule Take 1 capsule (220 mg total) by mouth 2 (two) times daily. 60 capsule 0   No current facility-administered medications for this encounter.    Allergies:   Acetaminophen, Contrast media [iodinated diagnostic agents], and Ibuprofen   Social History:  The patient  reports that he  quit smoking about 24 years ago. His smoking use included cigars. He has never used smokeless tobacco. He reports current alcohol use. He reports that he does not use drugs.   Family History:  The patient's family history includes Breast cancer in his maternal grandmother; Diabetes in his maternal grandmother; Heart disease in his father; Kidney disease in his father; Kidney failure in his father; Liver cancer in his paternal grandmother and paternal uncle; Valvular heart disease in his mother.   ROS:  Please see the history of present illness.   All other systems are personally reviewed and negative.  Vitals:   08/16/19 0934  BP: 126/84  Pulse: 71  SpO2: 96%   Wt Readings from Last 3 Encounters:  08/16/19 120.4 kg (265 lb 6.4 oz)  08/12/19 115.8 kg (255 lb 3 oz)  07/27/19 117.1 kg (258 lb 3.2 oz)   Exam:   General: NAD Neck: JVP 11-12 cm, no thyromegaly or thyroid nodule.  Lungs: Clear to auscultation bilaterally with normal respiratory effort. CV: Nondisplaced PMI.  Heart irregular S1/S2, no S3/S4, no murmur.  1+ edema to knees.  No carotid bruit.  Normal pedal pulses.  Abdomen: Soft, nontender, no hepatosplenomegaly, no distention.  Skin: Intact without lesions or rashes.  Neurologic: Alert and oriented x 3.  Psych: Normal affect. Extremities: No clubbing or cyanosis.  HEENT: Normal.   Recent Labs: 05/03/2019: TSH 1.615 06/01/2019: B Natriuretic Peptide 246.0 08/04/2019: ALT 19; Hemoglobin 11.6; Magnesium 1.6; Platelets 134 08/16/2019: BUN 59; Creatinine, Ser 1.61; Potassium 3.1; Sodium 139  Personally reviewed   Wt Readings from Last 3 Encounters:  08/16/19 120.4 kg (265 lb 6.4 oz)  08/12/19 115.8 kg (255 lb 3 oz)  07/27/19 117.1 kg (258 lb 3.2 oz)    ASSESSMENT AND PLAN:  1. Heart failure with mid range EF: With prominent RV failure/pulmonary hypertension. ?If RV failure is related to severe OSA, he was a remote smoker.  Echo in 2/21 with EF45-50%, moderately dilated RV with  moderately decreased systolic function, moderate pulmonary hypertension.  He is volume overloaded today with weight up 7 lbs.  NYHA class II-III symptoms.  He  has been taking prednisone for gout. REDS clip 49%.  - Increase torsemide to 80 mg bid.  I will have him take metolazone 2.5 x 1 on Tuesday morning and again on Friday morning.  After that, he will take a dose of metolazone 2.5 every Tuesday morning.  - Continue KCl 40 qam/20 qpm, take an extra 20 mEq KCl on metolazone days.  - BMET today and again in 10 days.  - Continue current lisinopril and dapagliflozin - I will arrange for for CardioMEMS placement, discussed with patient today.  2. Atrial fibrillation: Chronic. Rate controlled.   He will continue Eliquis. Not on beta blocker with history of bradycardia.   3. CAD: S/p CABG 2005.  Low risk Cardiolite in 1/20.   - No ASA given Eliquis use.  - Continue Crestor 10 mg daily, good lipids in 7/19.  4. CKD: Stage 3.  S/p renal transplantation in 2012. He remains on mycophenolate and tacrolimus.   - BMET today.  5. OSA: Continue Bipap every night.  6. HTN: Controlled.  7. Pulmonary hypertension: Severe pulmonary hypertension by 3/20 echo.  RHC in 2/21 with moderate, primarily pulmonary venous hypertension (PVR 3.25 WU). Suspect that there is also a component of group 3 PH (OSA, ?unrecognized OHS).  8. Gout: Recent acute gout flare. Remains on prednisone, try to minimize based on volume overload.    Followup in 10-14 days APP.   Signed, Loralie Champagne, MD  08/16/2019  [facility] [address] [facility] and [place] [address] [phone] (office) [phone] (fax)

## 2019-08-16 NOTE — Patient Instructions (Signed)
INCREASE Torsemide to 37m (4 tabs) twice a day   START Metolazone 2.568m(1 tab) weekly on Tuesdays. THIS WEEK ONLY take 1 tab on Tuesday and Friday.    Our CMA Chantel will be in touch with you regarding the Cardiomems.     Labs today We will only contact you if something comes back abnormal or we need to make some changes. Otherwise no news is good news!   Your physician recommends that you schedule a follow-up appointment in: 2 weeks with the Nurse Practitioner   Please call office at [phone] 2 if you have any questions or concerns.    At the [facility] and your health needs are our priority. As part of our continuing mission to provide you with exceptional heart care, we have created designated Provider Care Teams. These Care Teams include your primary Cardiologist (physician) and Advanced Practice Providers (APPs- Physician Assistants and Nurse Practitioners) who all work together to provide you with the care you need, when you need it.   You may see any of the following providers on your designated Care Team at your next follow up: . Marland Kitchenr DaGlori Bickers Dr DaLoralie Champagne AmDarrick GrinderNP . BrLyda JesterPA . LaAudry RilesPharmD   Please be sure to bring in all your medications bottles to every appointment.

## 2019-08-16 NOTE — Progress Notes (Signed)
ReDS Vest / Clip - 08/16/19 0900      ReDS Vest / Clip   Station Marker  D    Ruler Value  39.5    ReDS Value Range  (!) High volume overload    ReDS Actual Value  49    Anatomical Comments  sititng

## 2019-08-17 ENCOUNTER — Telehealth (HOSPITAL_COMMUNITY): Payer: Self-pay

## 2019-08-17 ENCOUNTER — Other Ambulatory Visit (HOSPITAL_COMMUNITY): Payer: Self-pay | Admitting: Cardiology

## 2019-08-17 DIAGNOSIS — I5032 Chronic diastolic (congestive) heart failure: Secondary | ICD-10-CM

## 2019-08-17 MED ORDER — POTASSIUM CHLORIDE ER 10 MEQ PO CPCR: ORAL_CAPSULE | ORAL | 4 refills | Status: DC

## 2019-08-17 NOTE — Telephone Encounter (Signed)
-----  Message from [name], MD sent at 08/16/2019  2:34 PM EDT ----- Add an extra 20 mEq daily of KCl to his regimen.  BMET 10 days.

## 2019-08-18 ENCOUNTER — Other Ambulatory Visit: Payer: Self-pay | Admitting: Internal Medicine

## 2019-08-18 ENCOUNTER — Other Ambulatory Visit: Payer: Self-pay | Admitting: Cardiovascular Disease

## 2019-08-18 DIAGNOSIS — Z79899 Other long term (current) drug therapy: Secondary | ICD-10-CM | POA: Diagnosis not present

## 2019-08-18 DIAGNOSIS — M109 Gout, unspecified: Secondary | ICD-10-CM | POA: Diagnosis not present

## 2019-08-18 DIAGNOSIS — Z940 Kidney transplant status: Secondary | ICD-10-CM | POA: Diagnosis not present

## 2019-08-18 DIAGNOSIS — Z1159 Encounter for screening for other viral diseases: Secondary | ICD-10-CM | POA: Diagnosis not present

## 2019-08-18 DIAGNOSIS — R768 Other specified abnormal immunological findings in serum: Secondary | ICD-10-CM | POA: Diagnosis not present

## 2019-08-18 DIAGNOSIS — M059 Rheumatoid arthritis with rheumatoid factor, unspecified: Secondary | ICD-10-CM | POA: Diagnosis not present

## 2019-08-19 DIAGNOSIS — M2041 Other hammer toe(s) (acquired), right foot: Secondary | ICD-10-CM | POA: Diagnosis not present

## 2019-08-19 DIAGNOSIS — E119 Type 2 diabetes mellitus without complications: Secondary | ICD-10-CM | POA: Diagnosis not present

## 2019-08-19 DIAGNOSIS — M2042 Other hammer toe(s) (acquired), left foot: Secondary | ICD-10-CM | POA: Diagnosis not present

## 2019-08-19 DIAGNOSIS — M10072 Idiopathic gout, left ankle and foot: Secondary | ICD-10-CM | POA: Diagnosis not present

## 2019-08-19 DIAGNOSIS — B351 Tinea unguium: Secondary | ICD-10-CM | POA: Diagnosis not present

## 2019-08-19 DIAGNOSIS — L600 Ingrowing nail: Secondary | ICD-10-CM | POA: Diagnosis not present

## 2019-08-25 DIAGNOSIS — M109 Gout, unspecified: Secondary | ICD-10-CM | POA: Diagnosis not present

## 2019-08-26 DIAGNOSIS — G4733 Obstructive sleep apnea (adult) (pediatric): Secondary | ICD-10-CM | POA: Diagnosis not present

## 2019-08-26 DIAGNOSIS — E662 Morbid (severe) obesity with alveolar hypoventilation: Secondary | ICD-10-CM | POA: Diagnosis not present

## 2019-08-30 ENCOUNTER — Encounter (HOSPITAL_COMMUNITY): Payer: 59

## 2019-09-14 ENCOUNTER — Ambulatory Visit (HOSPITAL_COMMUNITY)
Admission: RE | Admit: 2019-09-14 | Discharge: 2019-09-14 | Disposition: A | Discharge status: Home / Self Care | Payer: 59 | Source: Ambulatory Visit | Attending: Cardiology | Admitting: Cardiology

## 2019-09-14 ENCOUNTER — Encounter (HOSPITAL_COMMUNITY): Payer: Self-pay

## 2019-09-14 ENCOUNTER — Other Ambulatory Visit: Payer: Self-pay

## 2019-09-14 VITALS — BP 128/72 | HR 72 | Wt 259.0 lb

## 2019-09-14 DIAGNOSIS — N183 Chronic kidney disease, stage 3 unspecified: Secondary | ICD-10-CM | POA: Diagnosis not present

## 2019-09-14 DIAGNOSIS — Z803 Family history of malignant neoplasm of breast: Secondary | ICD-10-CM | POA: Insufficient documentation

## 2019-09-14 DIAGNOSIS — E1122 Type 2 diabetes mellitus with diabetic chronic kidney disease: Secondary | ICD-10-CM | POA: Insufficient documentation

## 2019-09-14 DIAGNOSIS — M109 Gout, unspecified: Secondary | ICD-10-CM | POA: Diagnosis not present

## 2019-09-14 DIAGNOSIS — Z794 Long term (current) use of insulin: Secondary | ICD-10-CM | POA: Insufficient documentation

## 2019-09-14 DIAGNOSIS — Z833 Family history of diabetes mellitus: Secondary | ICD-10-CM | POA: Diagnosis not present

## 2019-09-14 DIAGNOSIS — I2510 Atherosclerotic heart disease of native coronary artery without angina pectoris: Secondary | ICD-10-CM | POA: Diagnosis not present

## 2019-09-14 DIAGNOSIS — Z79899 Other long term (current) drug therapy: Secondary | ICD-10-CM | POA: Insufficient documentation

## 2019-09-14 DIAGNOSIS — Z961 Presence of intraocular lens: Secondary | ICD-10-CM | POA: Insufficient documentation

## 2019-09-14 DIAGNOSIS — I482 Chronic atrial fibrillation, unspecified: Secondary | ICD-10-CM | POA: Diagnosis not present

## 2019-09-14 DIAGNOSIS — I5032 Chronic diastolic (congestive) heart failure: Secondary | ICD-10-CM | POA: Diagnosis not present

## 2019-09-14 DIAGNOSIS — E785 Hyperlipidemia, unspecified: Secondary | ICD-10-CM | POA: Insufficient documentation

## 2019-09-14 DIAGNOSIS — Z940 Kidney transplant status: Secondary | ICD-10-CM | POA: Insufficient documentation

## 2019-09-14 DIAGNOSIS — Z886 Allergy status to analgesic agent status: Secondary | ICD-10-CM | POA: Diagnosis not present

## 2019-09-14 DIAGNOSIS — Z7951 Long term (current) use of inhaled steroids: Secondary | ICD-10-CM | POA: Diagnosis not present

## 2019-09-14 DIAGNOSIS — Z8249 Family history of ischemic heart disease and other diseases of the circulatory system: Secondary | ICD-10-CM | POA: Insufficient documentation

## 2019-09-14 DIAGNOSIS — Z87891 Personal history of nicotine dependence: Secondary | ICD-10-CM | POA: Diagnosis not present

## 2019-09-14 DIAGNOSIS — I272 Pulmonary hypertension, unspecified: Secondary | ICD-10-CM | POA: Insufficient documentation

## 2019-09-14 DIAGNOSIS — Z9842 Cataract extraction status, left eye: Secondary | ICD-10-CM | POA: Insufficient documentation

## 2019-09-14 DIAGNOSIS — Z841 Family history of disorders of kidney and ureter: Secondary | ICD-10-CM | POA: Diagnosis not present

## 2019-09-14 DIAGNOSIS — Z7901 Long term (current) use of anticoagulants: Secondary | ICD-10-CM | POA: Diagnosis not present

## 2019-09-14 DIAGNOSIS — Z951 Presence of aortocoronary bypass graft: Secondary | ICD-10-CM | POA: Insufficient documentation

## 2019-09-14 DIAGNOSIS — G4733 Obstructive sleep apnea (adult) (pediatric): Secondary | ICD-10-CM | POA: Diagnosis not present

## 2019-09-14 DIAGNOSIS — I255 Ischemic cardiomyopathy: Secondary | ICD-10-CM | POA: Insufficient documentation

## 2019-09-14 DIAGNOSIS — J45909 Unspecified asthma, uncomplicated: Secondary | ICD-10-CM | POA: Insufficient documentation

## 2019-09-14 DIAGNOSIS — Z9841 Cataract extraction status, right eye: Secondary | ICD-10-CM | POA: Insufficient documentation

## 2019-09-14 DIAGNOSIS — I130 Hypertensive heart and chronic kidney disease with heart failure and stage 1 through stage 4 chronic kidney disease, or unspecified chronic kidney disease: Secondary | ICD-10-CM | POA: Insufficient documentation

## 2019-09-14 DIAGNOSIS — Z9884 Bariatric surgery status: Secondary | ICD-10-CM | POA: Insufficient documentation

## 2019-09-14 LAB — BASIC METABOLIC PANEL
Anion gap: 10 (ref 5–15)
BUN: 35 mg/dL — ABNORMAL HIGH (ref 6–20)
CO2: 31 mmol/L (ref 22–32)
Calcium: 9.3 mg/dL (ref 8.9–10.3)
Chloride: 99 mmol/L (ref 98–111)
Creatinine, Ser: 1.41 mg/dL — ABNORMAL HIGH (ref 0.61–1.24)
GFR calc Af Amer: >60 mL/min (ref >60)
GFR calc non Af Amer: 54 mL/min — ABNORMAL LOW (ref >60)
Glucose, Bld: 336 mg/dL — ABNORMAL HIGH (ref 70–99)
Potassium: 4.1 mmol/L (ref 3.5–5.1)
Sodium: 140 mmol/L (ref 135–145)

## 2019-09-14 NOTE — Progress Notes (Signed)
Heart Failure Clinic Note   Date:  09/14/2019   ID:  Carl Capistran., DOB 02-29-1961, MRN [id number]   Provider location: [address] [place] Type of Visit:  Established patient  PCP:  [name], MD  Cardiologist:  [name] Primary HF: [name]   History of Present Illness: [name]. is a 59 y.o. male  with a history of CAD s/p CABG 2005, chronic atrial fibrillation on Eliquis, ESRD s/p renal transplant 2012, anc chronic diastolic CHF.  His renal function has been fairly stable, most recent creatinine 1.43.  He has been struggling with volume overload/CHF.  He has a history of ischemic cardiomyopathy with EF as low as 35-40% in the past but last echo in 3/20 showed EF 60-65%.  There is evidence for RV failure with pulmonary hypertension by echo.    Admitted 11/05/18 low grade fever and shortness of breath. HS Trop negative. Covid 19 negative. Blood CX negative. Placed on antibiotics and discharged to home the next day.   Admitted 03/31/55 with A/C Diastolic HF. Diuresed with IV lasix and transitioned to torsemide 80/40 mg . Discharge weight 252 pounds.   Admitted 06/2019 with left arm pain. Left upper extremity venous Doppler revealed occluded cephalic vein at the AV fistula outflow tract in the antecubital fossa with no other thrombus identified. Vascular evaluated. He continued on eliquis.  No plan for intervention. Also treated for acute gout flare. Discharged on prednisone taper.    Seen in f/u recently by [name] 5/24 and was volume overloaded. Wt up 7 lb. ReDs clip was 49%. This was after being started on prednisone for an acute gout flare. Torsemide was increased to 80 mg bid and he was advised to take 2 doses of 2.5 mg metolazone 2 days that week, then once weekly thereafter.   He returns for f/u. Wt is down 6 lb since last OV.  ReDs clip trending down, now at 41%. He feels better but does note a 4 lb increase since this weekend. He  attributes this to dietary indiscretion. He just returned from vacation and was eating salty foods and lots of watermelon. His urine output has been good. Reports full med compliance. He does ok walking on flat surfaces but struggles w/ stairs and inclines. Still awaiting insurance approval for cardiomems.   Labs (7/19): LDL 49 Labs (2/20): K 3.7, creatinine 1.43 Labs (4/20): K 4.3, creatinine 1.24 Labs (11/06/18): K 3.7 creatinine 1.23  Labs (05/07/19): K 3.8 Creatinine 1.5  Labs (07/18/19): K 4.3 Creatinine 1.42  Labs (5/21): K 3.3, creatinine 1.21   REDS clip 41%  PMH: 1. CAD: s/p CABG x 4 in [place] in 2005.  - Cardiolite (1/20): EF 47%, small fixed apical septal defect, no ischemia.  Low risk study.  2. Asthma 3. Gout 4. Atrial fibrillation: Chronic.  5. ESRD s/p renal transplantation at [facility] in 2012.  6. Anemia of chronic disease 7. Type II diabetes 8. HTN 9. Hyperlipidemia 10. OSA: On bipap.  11. Ischemic cardiomyopathy: EF 35-40% in past.  - Cardiolite (1/20) with EF 47%.  - Echo (1/20): EF 50-55%, mild MR, RV moderately dilated with moderately decreased systolic function, PASP 73 mmHg, moderate TR.   - Echo (3/20): EF 60-65%, PASP 75 mmHg, mildly dilated RV with mildly decreased systolic function.  - Echo (2/21): EF 45-50%, global HK, moderately dilated RV with moderately decreased systolic function, D-shaped interventricular septum, dilated IVC.  - RHC (  2/21): mean RA 19, PA 78/31 mean 51, mean PCWP 26, CI 3.26, PVR 3.25 12. Obesity: s/p gastric bypass in 2015.  13. Bradycardia with beta blocker.   Past Surgical History:  Procedure Laterality Date  . BARIATRIC SURGERY    . CARDIAC CATHETERIZATION     [facility]  . CARDIAC CATHETERIZATION  05/03/2019  . CATARACT EXTRACTION W/PHACO Right 10/29/2017   Procedure: CATARACT EXTRACTION PHACO AND INTRAOCULAR LENS PLACEMENT ([place])  RIGHT DIABETIC;  Surgeon: [name], MD;  Location: [place];  Service:  Ophthalmology;  Laterality: Right;  Diabetic - insulin  . CATARACT EXTRACTION W/PHACO Left 11/18/2017   Procedure: CATARACT EXTRACTION PHACO AND INTRAOCULAR LENS PLACEMENT ([place]) LEFT IVA/TOPICAL;  Surgeon: [name], MD;  Location: [place];  Service: Ophthalmology;  Laterality: Left;  DIABETES - insulin sleep apnea  . COLONOSCOPY WITH PROPOFOL N/A 04/22/2013   Procedure: COLONOSCOPY WITH PROPOFOL;  Surgeon: [name], MD;  Location: [facility];  Service: Endoscopy;  Laterality: N/A;  . CORONARY ARTERY BYPASS GRAFT  2005   X 4  . DG ANGIO AV SHUNT*L*     right and left upper arms  . FASCIOTOMY  03/03/2012   Procedure: FASCIOTOMY;  Surgeon: [name], MD;  Location: [place];  Service: Orthopedics;  Laterality: Right;  FASCIOTOMY RIGHT SMALL FINGER  . FASCIOTOMY Left 08/17/2013   Procedure: FASCIOTOMY LEFT RING;  Surgeon: [name], MD;  Location: [place];  Service: Orthopedics;  Laterality: Left;  . INCISION AND DRAINAGE ABSCESS Left 10/15/2015   Procedure: INCISION AND DRAINAGE ABSCESS;  Surgeon: [name], MD;  Location: [facility];  Service: General;  Laterality: Left;  . KIDNEY TRANSPLANT  09/13/2010   cadaver--at [facility]  . RIGHT HEART CATH N/A 11/15/2016   Procedure: RIGHT HEART CATH;  Surgeon: [name], MD;  Location: [place] [facility];  Service: Cardiovascular;  Laterality: N/A;  . RIGHT HEART CATH N/A 05/03/2019   Procedure: RIGHT HEART CATH;  Surgeon: [name], MD;  Location: [place] [facility];  Service: Cardiovascular;  Laterality: N/A;  . TYMPANIC MEMBRANE REPAIR  1/12   left  . VASECTOMY       Current Outpatient Medications  Medication Sig Dispense Refill  . allopurinol (ZYLOPRIM) 100 MG tablet Take 0.5 tablets (50 mg total) by mouth daily. (Patient taking differently: Take 50 mg by mouth daily. 1.5 tablet) 30 tablet 1  . amLODipine (NORVASC) 5 MG tablet Take 1 tablet (5 mg total) by  mouth daily. 90 tablet 2  . apixaban (ELIQUIS) 5 MG TABS tablet Take 1 tablet (5 mg total) by mouth 2 (two) times daily. 180 tablet 1  . beclomethasone (QVAR) 80 MCG/ACT inhaler Inhale 2 puffs into the lungs 2 (two) times daily.    Marland Kitchen buPROPion (WELLBUTRIN SR) 150 MG 12 hr tablet Take 150 mg by mouth 2 (two) times daily.    . dapagliflozin propanediol (FARXIGA) 10 MG TABS tablet Take 10 mg by mouth daily before breakfast. 30 tablet 6  . fluticasone (FLONASE) 50 MCG/ACT nasal spray PLACE 2 SPRAYS INTO BOTH NOSTRILS DAILY. 16 g 11  . gabapentin (NEURONTIN) 300 MG capsule Take 300 mg by mouth at bedtime.     Marland Kitchen HUMALOG KWIKPEN 100 UNIT/ML KwikPen Inject 5-10 Units into the skin 3 (three) times daily. Sliding scale    . insulin glargine (LANTUS) 100 unit/mL SOPN Inject 28 Units into the skin at bedtime.    Marland Kitchen lisinopril (ZESTRIL)  40 MG tablet Take 0.5 tablets (20 mg total) by mouth daily. 90 tablet 3  . metolazone (ZAROXOLYN) 2.5 MG tablet Take 1 tablet (2.5 mg total) by mouth once a week. 6 tablet 5  . montelukast (SINGULAIR) 10 MG tablet TAKE 1 TABLET BY MOUTH AT BEDTIME. 90 tablet 3  . Multiple Vitamin (MULTIVITAMIN WITH MINERALS) TABS tablet Take 1 tablet by mouth daily.    . mycophenolate (MYFORTIC) 180 MG EC tablet Take 360 mg by mouth 2 (two) times daily.    Marland Kitchen omeprazole (PRILOSEC) 20 MG capsule Take 20 mg by mouth daily.    . potassium chloride (MICRO-K) 10 MEQ CR capsule Take 4 capsules (40 mEq total) by mouth every morning AND 4 capsules (40 mEq total) every evening. 240 capsule 4  . rosuvastatin (CRESTOR) 10 MG tablet TAKE 1 TABLET BY MOUTH DAILY 90 tablet 0  . tacrolimus (PROGRAF) 1 MG capsule Take by mouth 2 (two) times daily. 3 mg in AM and 64m in PM    . tamsulosin (FLOMAX) 0.4 MG CAPS capsule Take 0.4 mg by mouth daily.     .Marland Kitchentorsemide (DEMADEX) 20 MG tablet Take 4 tablets (80 mg total) by mouth 2 (two) times daily. 240 tablet 4  . vitamin C (ASCORBIC ACID) 250 MG tablet Take 1 tablet  (250 mg total) by mouth daily. 30 tablet 0  . zinc sulfate 220 (50 Zn) MG capsule Take 1 capsule (220 mg total) by mouth 2 (two) times daily. 60 capsule 0  . benzonatate (TESSALON) 200 MG capsule Take 1 capsule (200 mg total) by mouth 3 (three) times daily as needed for cough. (Patient not taking: Reported on 09/14/2019) 60 capsule 0   No current facility-administered medications for this encounter.    Allergies:   Acetaminophen, Contrast media [iodinated diagnostic agents], and Ibuprofen   Social History:  The patient  reports that he quit smoking about 25 years ago. His smoking use included cigars. He has never used smokeless tobacco. He reports current alcohol use. He reports that he does not use drugs.   Family History:  The patient's family history includes Breast cancer in his maternal grandmother; Diabetes in his maternal grandmother; Heart disease in his father; Kidney disease in his father; Kidney failure in his father; Liver cancer in his paternal grandmother and paternal uncle; Valvular heart disease in his mother.   ROS:  Please see the history of present illness.   All other systems are personally reviewed and negative.  Vitals:   09/14/19 0954  BP: 128/72  Pulse: 72  SpO2: 95%   Wt Readings from Last 3 Encounters:  09/14/19 117.5 kg (259 lb)  08/16/19 120.4 kg (265 lb 6.4 oz)  08/12/19 115.8 kg (255 lb 3 oz)   PHYSICAL EXAM: General:  Well appearing, moderately obese. No respiratory difficulty HEENT: normal Neck: supple.  JVD ~9 cm. Carotids 2+ bilat; no bruits. No lymphadenopathy or thyromegaly appreciated. Cor: PMI nondisplaced. Irregular rhythm, regular rate. No rubs, gallops or murmurs. Lungs: clear Abdomen: mildly obese, soft, nontender, nondistended. No hepatosplenomegaly. No bruits or masses. Good bowel sounds. Extremities: no cyanosis, clubbing, rash, no edema, + bilateral chronic venous stasis dermatitis  Neuro: alert & oriented x 3, cranial nerves grossly  intact. moves all 4 extremities w/o difficulty. Affect pleasant.   Recent Labs: 05/03/2019: TSH 1.615 06/01/2019: B Natriuretic Peptide 246.0 08/04/2019: ALT 19; Hemoglobin 11.6; Magnesium 1.6; Platelets 134 08/16/2019: BUN 59; Creatinine, Ser 1.61; Potassium 3.1; Sodium 139   Personally reviewed  Wt Readings from Last 3 Encounters:  09/14/19 117.5 kg (259 lb)  08/16/19 120.4 kg (265 lb 6.4 oz)  08/12/19 115.8 kg (255 lb 3 oz)    ASSESSMENT AND PLAN:  1. Heart failure with mid range EF: With prominent RV failure/pulmonary hypertension. ?If RV failure is related to severe OSA, he was a remote smoker.  Echo in 2/21 with EF45-50%, moderately dilated RV with moderately decreased systolic function, moderate pulmonary hypertension.  Recent diuretic increase for volume overload 2/2 prednisolone use for acute gout flare. Overall improved but remains mildly volume overloaded w/ chronic NYHA Class II-III symptoms. Wt down 6 lb and ReDs clip measurement trending down from 49%>>41% today.  - Continue torsemide 80 mg bid - Instructed to take an extra dose of metolazone this week. Take 2.5 mg today and 2.5 mg on Thursday. Return to once weekly q tuesdays next week - encouraged sodium/fluid restriction .  - Continue KCl 40 qam/20 qpm, take an extra 20 mEq KCl on metolazone days.  - Repeat BMP today  - Continue current lisinopril and dapagliflozin - Awaiting insurance approval for CardioMEMS placement  2. Atrial fibrillation: Chronic. Rate controlled.  - Continue Eliquis. Denies abnormal bleeding. hgb stable on recent labs   - Not on beta blocker with history of bradycardia.   3. CAD: S/p CABG 2005.  Low risk Cardiolite in 1/20.   - no anginal symptoms  - No ASA given Eliquis use.  - Continue Crestor 10 mg daily, good lipids in 7/19.  4. CKD: Stage 3.  S/p renal transplantation in 2012. He remains on mycophenolate and tacrolimus.   - check BMP today.  - he is followed by nephrology  5. OSA: Continue  Bipap every night.  6. HTN: Controlled on current regimen.  7. Pulmonary hypertension: Severe pulmonary hypertension by 3/20 echo.  RHC in 2/21 with moderate, primarily pulmonary venous hypertension (PVR 3.25 WU). Suspect that there is also a component of group 3 PH (OSA, ?unrecognized OHS).  8. Gout: Recent acute gout flare. Remains on prednisone but weaning down, try to minimize based on volume overload.    F/u w/ [name] in 2 months, sooner if needed. He will call if any progressive wt gain.   Signed, Lyda Jester, PA-C  09/14/2019  [facility] [address] [facility] and [place] [address] [phone] (office) [phone] (fax)

## 2019-09-14 NOTE — Patient Instructions (Signed)
Be sure to take Metolazone 2.5 mg on Thursday 09/16/19  Labs today We will only contact you if something comes back abnormal or we need to make some changes. Otherwise no news is good news!   Your physician recommends that you schedule a follow-up appointment in: 2 months  Do the following things EVERYDAY: 1) Weigh yourself in the morning before breakfast. Write it down and keep it in a log. 2) Take your medicines as prescribed 3) Eat low salt foods--Limit salt (sodium) to 2000 mg per day.  4) Stay as active as you can everyday 5) Limit all fluids for the day to less than 2 liters  At the [facility], you and your health needs are our priority. As part of our continuing mission to provide you with exceptional heart care, we have created designated Provider Care Teams. These Care Teams include your primary Cardiologist (physician) and Advanced Practice Providers (APPs- Physician Assistants and Nurse Practitioners) who all work together to provide you with the care you need, when you need it.   You may see any of the following providers on your designated Care Team at your next follow up: Marland Kitchen Dr Glori Bickers . Dr Loralie Champagne . Darrick Grinder, NP . Lyda Jester, PA . Audry Riles, PharmD   Please be sure to bring in all your medications bottles to every appointment.

## 2019-09-14 NOTE — Progress Notes (Signed)
ReDS Vest / Clip - 09/14/19 1000      ReDS Vest / Clip   Station Marker D    Ruler Value 33    ReDS Value Range High volume overload    ReDS Actual Value 41    Anatomical Comments siting

## 2019-09-22 DIAGNOSIS — Z794 Long term (current) use of insulin: Secondary | ICD-10-CM | POA: Diagnosis not present

## 2019-09-22 DIAGNOSIS — E113293 Type 2 diabetes mellitus with mild nonproliferative diabetic retinopathy without macular edema, bilateral: Secondary | ICD-10-CM | POA: Diagnosis not present

## 2019-09-24 ENCOUNTER — Other Ambulatory Visit: Payer: Self-pay | Admitting: Internal Medicine

## 2019-09-24 DIAGNOSIS — Z7952 Long term (current) use of systemic steroids: Secondary | ICD-10-CM | POA: Diagnosis not present

## 2019-09-24 DIAGNOSIS — E669 Obesity, unspecified: Secondary | ICD-10-CM | POA: Diagnosis not present

## 2019-09-24 DIAGNOSIS — E1142 Type 2 diabetes mellitus with diabetic polyneuropathy: Secondary | ICD-10-CM | POA: Diagnosis not present

## 2019-09-24 DIAGNOSIS — E1165 Type 2 diabetes mellitus with hyperglycemia: Secondary | ICD-10-CM | POA: Diagnosis not present

## 2019-09-24 DIAGNOSIS — E113293 Type 2 diabetes mellitus with mild nonproliferative diabetic retinopathy without macular edema, bilateral: Secondary | ICD-10-CM | POA: Diagnosis not present

## 2019-09-24 DIAGNOSIS — E1169 Type 2 diabetes mellitus with other specified complication: Secondary | ICD-10-CM | POA: Diagnosis not present

## 2019-09-24 DIAGNOSIS — Z794 Long term (current) use of insulin: Secondary | ICD-10-CM | POA: Diagnosis not present

## 2019-09-25 DIAGNOSIS — G4733 Obstructive sleep apnea (adult) (pediatric): Secondary | ICD-10-CM | POA: Diagnosis not present

## 2019-09-25 DIAGNOSIS — E662 Morbid (severe) obesity with alveolar hypoventilation: Secondary | ICD-10-CM | POA: Diagnosis not present

## 2019-09-28 DIAGNOSIS — E1165 Type 2 diabetes mellitus with hyperglycemia: Secondary | ICD-10-CM | POA: Diagnosis not present

## 2019-09-28 DIAGNOSIS — E119 Type 2 diabetes mellitus without complications: Secondary | ICD-10-CM | POA: Diagnosis not present

## 2019-10-04 ENCOUNTER — Other Ambulatory Visit: Payer: Self-pay

## 2019-10-05 ENCOUNTER — Ambulatory Visit (INDEPENDENT_AMBULATORY_CARE_PROVIDER_SITE_OTHER): Payer: 59 | Admitting: Internal Medicine

## 2019-10-05 ENCOUNTER — Other Ambulatory Visit: Payer: Self-pay | Admitting: Internal Medicine

## 2019-10-05 ENCOUNTER — Encounter: Payer: Self-pay | Admitting: Internal Medicine

## 2019-10-05 VITALS — BP 122/64 | HR 66 | Temp 98.1°F | Ht 69.0 in | Wt 262.0 lb

## 2019-10-05 DIAGNOSIS — Z23 Encounter for immunization: Secondary | ICD-10-CM

## 2019-10-05 DIAGNOSIS — E1165 Type 2 diabetes mellitus with hyperglycemia: Secondary | ICD-10-CM

## 2019-10-05 DIAGNOSIS — N5201 Erectile dysfunction due to arterial insufficiency: Secondary | ICD-10-CM | POA: Diagnosis not present

## 2019-10-05 DIAGNOSIS — I2510 Atherosclerotic heart disease of native coronary artery without angina pectoris: Secondary | ICD-10-CM | POA: Diagnosis not present

## 2019-10-05 DIAGNOSIS — Z125 Encounter for screening for malignant neoplasm of prostate: Secondary | ICD-10-CM | POA: Diagnosis not present

## 2019-10-05 DIAGNOSIS — Z794 Long term (current) use of insulin: Secondary | ICD-10-CM | POA: Diagnosis not present

## 2019-10-05 DIAGNOSIS — I5032 Chronic diastolic (congestive) heart failure: Secondary | ICD-10-CM | POA: Diagnosis not present

## 2019-10-05 DIAGNOSIS — Z0000 Encounter for general adult medical examination without abnormal findings: Secondary | ICD-10-CM

## 2019-10-05 LAB — PSA, MEDICARE: PSA: 1.28 ng/ml (ref 0.10–4.00)

## 2019-10-05 LAB — HM DIABETES FOOT EXAM

## 2019-10-05 MED ORDER — OMEPRAZOLE 20 MG PO CPDR: 20.0000 mg | DELAYED_RELEASE_CAPSULE | Freq: Every day | ORAL | 3 refills | Status: DC

## 2019-10-05 NOTE — Assessment & Plan Note (Signed)
Overdue for colon--will ask Dr Ardis Hughs to set this up Discussed PSA---he would like this checked Flu vaccine in the fall shingrix today

## 2019-10-05 NOTE — Progress Notes (Signed)
Subjective:    Patient ID: [name]., male    DOB: 03/08/1961, 59 y.o.   MRN: [id number]  HPI Here for physical This visit occurred during the SARS-CoV-2 public health emergency.  Safety protocols were in place, including screening questions prior to the visit, additional usage of staff PPE, and extensive cleaning of exam room while observing appropriate contact time as indicated for disinfecting solutions.   Still having finger swelling from the gout Taking one prednisone daily Dr Meda Coffee is leaving--needs someone new  Depression is controlled now Continues on the bupropion twice a day and this is effective  Dr Gabriel Carina for the diabetes Last A1c 9% Now has CGM---Libre. He loves it Is using it to adjust his insulin Some foot pain--does take gabapentin  Kidney function stable Continues on immunosuppressants  No heart problems  Current Outpatient Medications on File Prior to Visit  Medication Sig Dispense Refill  . allopurinol (ZYLOPRIM) 100 MG tablet Take 0.5 tablets (50 mg total) by mouth daily. (Patient taking differently: Take 50 mg by mouth daily. 1.5 tablet) 30 tablet 1  . amLODipine (NORVASC) 5 MG tablet Take 1 tablet (5 mg total) by mouth daily. 90 tablet 2  . apixaban (ELIQUIS) 5 MG TABS tablet Take 1 tablet (5 mg total) by mouth 2 (two) times daily. 180 tablet 1  . beclomethasone (QVAR) 80 MCG/ACT inhaler Inhale 2 puffs into the lungs 2 (two) times daily.    Marland Kitchen buPROPion (WELLBUTRIN SR) 150 MG 12 hr tablet Take 150 mg by mouth 2 (two) times daily.    . dapagliflozin propanediol (FARXIGA) 10 MG TABS tablet Take 10 mg by mouth daily before breakfast. 30 tablet 6  . FERREX 150 150 MG capsule Take by mouth.    . fluticasone (FLONASE) 50 MCG/ACT nasal spray PLACE 2 SPRAYS INTO BOTH NOSTRILS DAILY. 16 g 11  . gabapentin (NEURONTIN) 300 MG capsule Take 300 mg by mouth at bedtime.     Marland Kitchen HUMALOG KWIKPEN 100 UNIT/ML KwikPen Inject 5-10 Units into the skin 3 (three) times  daily. Sliding scale    . insulin glargine (LANTUS) 100 unit/mL SOPN Inject 40 Units into the skin at bedtime.     Marland Kitchen lisinopril (ZESTRIL) 40 MG tablet Take 0.5 tablets (20 mg total) by mouth daily. 90 tablet 3  . metolazone (ZAROXOLYN) 2.5 MG tablet Take 1 tablet (2.5 mg total) by mouth once a week. 6 tablet 5  . montelukast (SINGULAIR) 10 MG tablet TAKE 1 TABLET BY MOUTH AT BEDTIME. 90 tablet 3  . Multiple Vitamin (MULTIVITAMIN WITH MINERALS) TABS tablet Take 1 tablet by mouth daily.    . mycophenolate (MYFORTIC) 180 MG EC tablet Take 360 mg by mouth 2 (two) times daily.    Marland Kitchen omeprazole (PRILOSEC) 20 MG capsule Take 20 mg by mouth daily.    . potassium chloride (MICRO-K) 10 MEQ CR capsule Take 4 capsules (40 mEq total) by mouth every morning AND 4 capsules (40 mEq total) every evening. 240 capsule 4  . predniSONE (DELTASONE) 5 MG tablet Take 5 mg by mouth daily.    . rosuvastatin (CRESTOR) 10 MG tablet TAKE 1 TABLET BY MOUTH DAILY 90 tablet 0  . tacrolimus (PROGRAF) 1 MG capsule Take by mouth 2 (two) times daily. 3 mg in AM and 57m in PM    . tamsulosin (FLOMAX) 0.4 MG CAPS capsule Take 0.4 mg by mouth daily.     .Marland Kitchentorsemide (DEMADEX) 20 MG tablet Take 4 tablets (80 mg  total) by mouth 2 (two) times daily. 240 tablet 4  . vitamin C (ASCORBIC ACID) 250 MG tablet Take 1 tablet (250 mg total) by mouth daily. 30 tablet 0  . zinc sulfate 220 (50 Zn) MG capsule Take 1 capsule (220 mg total) by mouth 2 (two) times daily. 60 capsule 0   No current facility-administered medications on file prior to visit.    Allergies  Allergen Reactions  . Acetaminophen Other (See Comments)    BC of transplant  . Contrast Media [Iodinated Diagnostic Agents]     Kidney transplant  . Ibuprofen Other (See Comments)    Due to kidney transplant Due to kidney transplant Due to kidney transplant    Past Medical History:  Diagnosis Date  . Allergy    seasonal  . Amnestic MCI (mild cognitive impairment with memory  loss)   . Asthma   . Benign neoplasm of colon   . CAD (coronary artery disease) 2005   a.  Status post four-vessel CABG in 2005; b. 03/2018 MV: Small, fixed inferoapical and apical septal defect. No ischemia. EF 47% (60-65% by 05/2018 Echo).  . Chronic atrial fibrillation ([place])    a. CHADS2VASc 4 (CHF, HTN, DM, vascular disease)--> Eliquis; b. 09/2018 Zio: Chronic Afib.  . Chronic combined systolic and diastolic CHF (congestive heart failure) ([place])    a.  7/18 Echo: EF 45-50%; b. 05/2018 Echo: EF 60-65%, RVSP 74.8; c. 12/2018 Echo: EF 50-55%. Sev dil LA.  Marland Kitchen Chronic venous stasis dermatitis of both lower extremities   . Cytomegaloviral disease ([place]) 2017  . Depression   . Diabetes mellitus   . Diverticulosis   . Dyspnea   . Erectile dysfunction   . ESRD (end stage renal disease) ([place]) 2005   a. HD 2004-2012; b. S/p renal transplant in 2012  . GERD (gastroesophageal reflux disease)   . Gout   . Hyperlipidemia    low since renal failure  . Hypertension   . Kidney transplant status, cadaveric 2012  . Obesity   . PAH (pulmonary artery hypertension) ([place])    a. 05/2018 Echo: RVSP 74.65mHg; b. 12/2018 RV not well visualized.  . Pneumonia   . [place]pura ([place]   . Sleep apnea    bipap with oxygen 2 l  . Trigger finger   . Ulcer 05/2016   Left shin  . Wears glasses     Past Surgical History:  Procedure Laterality Date  . BARIATRIC SURGERY    . CARDIAC CATHETERIZATION     [facility]  . CARDIAC CATHETERIZATION  05/03/2019  . CATARACT EXTRACTION W/PHACO Right 10/29/2017   Procedure: CATARACT EXTRACTION PHACO AND INTRAOCULAR LENS PLACEMENT ([place]  RIGHT DIABETIC;  Surgeon: BLeandrew Koyanagi MD;  [place] [facility]  Service: Ophthalmology;  Laterality: Right;  Diabetic - insulin  . CATARACT EXTRACTION W/PHACO Left 11/18/2017   Procedure: CATARACT EXTRACTION PHACO AND INTRAOCULAR LENS PLACEMENT ([place] LEFT IVA/TOPICAL;  Surgeon: BLeandrew Koyanagi MD;  [place] [facility]   Service: Ophthalmology;  Laterality: Left;  DIABETES - insulin sleep apnea  . COLONOSCOPY WITH PROPOFOL N/A 04/22/2013   Procedure: COLONOSCOPY WITH PROPOFOL;  Surgeon: DMilus Banister MD;  [facility];  Service: Endoscopy;  Laterality: N/A;  . CORONARY ARTERY BYPASS GRAFT  2005   X 4  . DG ANGIO AV SHUNT*L*     right and left upper arms  . FASCIOTOMY  03/03/2012   Procedure: FASCIOTOMY;  Surgeon: GWynonia Sours MD;  [place]: [facility]  Service: Orthopedics;  Laterality: Right;  FASCIOTOMY RIGHT SMALL FINGER  . FASCIOTOMY Left 08/17/2013   Procedure: FASCIOTOMY LEFT RING;  Surgeon: [name], MD;  Location: [place];  Service: Orthopedics;  Laterality: Left;  . INCISION AND DRAINAGE ABSCESS Left 10/15/2015   Procedure: INCISION AND DRAINAGE ABSCESS;  Surgeon: [name], MD;  Location: [facility];  Service: General;  Laterality: Left;  . KIDNEY TRANSPLANT  09/13/2010   cadaver--at [facility]  . RIGHT HEART CATH N/A 11/15/2016   Procedure: RIGHT HEART CATH;  Surgeon: [name], MD;  Location: [place] [facility];  Service: Cardiovascular;  Laterality: N/A;  . RIGHT HEART CATH N/A 05/03/2019   Procedure: RIGHT HEART CATH;  Surgeon: [name], MD;  Location: [place] [facility];  Service: Cardiovascular;  Laterality: N/A;  . TYMPANIC MEMBRANE REPAIR  1/12   left  . VASECTOMY      Family History  Problem Relation Age of Onset  . Heart disease Father   . Kidney failure Father   . Kidney disease Father   . Diabetes Maternal Grandmother   . Breast cancer Maternal Grandmother   . Valvular heart disease Mother   . Liver cancer Paternal Uncle   . Liver cancer Paternal Grandmother   . Prostate cancer Neg Hx     Social History   Socioeconomic History  . Marital status: Married    Spouse name: Not on file  . Number of children: 2  . Years of education: Not on file  . Highest education level: Not on file  Occupational History  .  Occupation: Academic librarian: unemployed    Comment: disabled due to kidney failure  Tobacco Use  . Smoking status: Former Smoker    Types: Cigars    Quit date: 09/02/1994    Years since quitting: 25.1  . Smokeless tobacco: Never Used  Vaping Use  . Vaping Use: Never used  Substance and Sexual Activity  . Alcohol use: Yes    Alcohol/week: 0.0 - 1.0 standard drinks    Comment: occasional- 3 in the past year  . Drug use: No  . Sexual activity: Not on file  Other Topics Concern  . Not on file  Social History Narrative   In school for culinary arts      Has living will   Wife is health care POA   Would accept resuscitation attempts   Hasn't considered tube feedings   Social Determinants of Health   Financial Resource Strain:   . Difficulty of Paying Living Expenses:   Food Insecurity:   . Worried About Charity fundraiser in the Last Year:   . Arboriculturist in the Last Year:   Transportation Needs:   . Film/video editor (Medical):   Marland Kitchen Lack of Transportation (Non-Medical):   Physical Activity:   . Days of Exercise per Week:   . Minutes of Exercise per Session:   Stress:   . Feeling of Stress :   Social Connections:   . Frequency of Communication with Friends and Family:   . Frequency of Social Gatherings with Friends and Family:   . Attends Religious Services:   . Active Member of Clubs or Organizations:   . Attends Archivist Meetings:   Marland Kitchen Marital Status:   Intimate Partner Violence:   . Fear of Current or Ex-Partner:   . Emotionally Abused:   Marland Kitchen Physically Abused:   . Sexually Abused:  Review of Systems  Constitutional: Negative for fatigue and unexpected weight change.       Wears seat belt  HENT: Negative for tinnitus and trouble swallowing.        Keeps up with dentist-needs new crown Some hearing problems  Eyes: Negative for visual disturbance.       Due for eye exam soon  Respiratory: Negative for cough, chest  tightness and shortness of breath.   Cardiovascular: Positive for palpitations and leg swelling. Negative for chest pain.  Gastrointestinal: Negative for blood in stool and constipation.       Some heartburn---when stopped omeprazole  Endocrine: Negative for polydipsia and polyuria.  Genitourinary: Negative for difficulty urinating.       Urgency only after furosemide  Musculoskeletal: Positive for arthralgias. Negative for joint swelling.       Some hip pain--uses tylenol with success  Skin: Negative for rash.  Allergic/Immunologic: Positive for environmental allergies. Negative for immunocompromised state.       On nasal steroid  Neurological: Positive for dizziness. Negative for syncope and headaches.       Has had some low BP---90/55  Hematological: Negative for adenopathy. Bruises/bleeds easily.  Psychiatric/Behavioral: Negative for dysphoric mood and sleep disturbance. The patient is not nervous/anxious.        Objective:   Physical Exam Constitutional:      General: He is not in acute distress.    Appearance: Normal appearance.  HENT:     Nose:     Comments: No lesions    Mouth/Throat:     Mouth: Mucous membranes are moist.  Eyes:     Conjunctiva/sclera: Conjunctivae normal.     Pupils: Pupils are equal, round, and reactive to light.  Cardiovascular:     Rate and Rhythm: Normal rate and regular rhythm.     Pulses: Normal pulses.     Heart sounds: No murmur heard.  No gallop.   Pulmonary:     Effort: Pulmonary effort is normal.     Breath sounds: Normal breath sounds. No wheezing or rales.  Abdominal:     Palpations: Abdomen is soft.     Tenderness: There is no abdominal tenderness.  Musculoskeletal:     Right lower leg: No edema.     Left lower leg: No edema.  Skin:    Findings: No rash.     Comments: Stasis derm both calves--no ulcer  Neurological:     Mental Status: He is alert and oriented to person, place, and time.  Psychiatric:        Mood and Affect:  Mood normal.        Behavior: Behavior normal.            Assessment & Plan:

## 2019-10-05 NOTE — Addendum Note (Signed)
Addended by: Pilar Grammes on: 10/05/2019 12:38 PM   Modules accepted: Orders

## 2019-10-05 NOTE — Assessment & Plan Note (Signed)
No recent angina Sees cardiology

## 2019-10-05 NOTE — Assessment & Plan Note (Signed)
Some improvement but still not well controlled Continues with Dr Gabriel Carina

## 2019-10-05 NOTE — Assessment & Plan Note (Signed)
Wants to consider pump Will set up urology evaluation

## 2019-10-05 NOTE — Assessment & Plan Note (Signed)
Compensated now

## 2019-10-06 ENCOUNTER — Other Ambulatory Visit: Payer: Self-pay | Admitting: Internal Medicine

## 2019-10-07 ENCOUNTER — Encounter: Payer: Self-pay | Admitting: Gastroenterology

## 2019-10-15 ENCOUNTER — Telehealth (INDEPENDENT_AMBULATORY_CARE_PROVIDER_SITE_OTHER): Payer: 59 | Admitting: Family Medicine

## 2019-10-15 VITALS — BP 124/77 | HR 76 | Temp 97.4°F | Ht 69.0 in | Wt 262.0 lb

## 2019-10-15 DIAGNOSIS — H1033 Unspecified acute conjunctivitis, bilateral: Secondary | ICD-10-CM | POA: Diagnosis not present

## 2019-10-15 DIAGNOSIS — D849 Immunodeficiency, unspecified: Secondary | ICD-10-CM

## 2019-10-15 DIAGNOSIS — H9201 Otalgia, right ear: Secondary | ICD-10-CM

## 2019-10-15 DIAGNOSIS — R05 Cough: Secondary | ICD-10-CM

## 2019-10-15 DIAGNOSIS — I2510 Atherosclerotic heart disease of native coronary artery without angina pectoris: Secondary | ICD-10-CM

## 2019-10-15 DIAGNOSIS — I252 Old myocardial infarction: Secondary | ICD-10-CM

## 2019-10-15 DIAGNOSIS — R059 Cough, unspecified: Secondary | ICD-10-CM

## 2019-10-15 MED ORDER — AMOXICILLIN 500 MG PO CAPS: 1000.0000 mg | ORAL_CAPSULE | Freq: Two times a day (BID) | ORAL | 0 refills | Status: DC

## 2019-10-15 MED ORDER — BENZONATATE 200 MG PO CAPS
200.0000 mg | ORAL_CAPSULE | Freq: Two times a day (BID) | ORAL | 0 refills | Status: DC | PRN
Start: 2019-10-15 — End: 2020-03-07

## 2019-10-15 MED ORDER — GUAIFENESIN-CODEINE 100-10 MG/5ML PO SYRP: 5.0000 mL | ORAL_SOLUTION | Freq: Every evening | ORAL | 0 refills | Status: DC | PRN

## 2019-10-15 NOTE — Assessment & Plan Note (Addendum)
Likely viral infection.  Doubt COVID19 as he is vaccinated but if not improving consider testing. No clear sign of bacterial infection at this time ( except possible ear issue), but pt is high risk given immunocompromised.     No red flags/need for ER visit or in-person exam at respiratory clinic at this time..    Pt high risk for COVID complications. If SOB begins symptoms worsening or not improving as expected... have low threshold for in-person exam, if severe shortness of breath ER visit recommended.  Can monitor Oxygen saturation at home with home monitor if able to obtain.  Go to ER if O2 sat < 90% on room air.  Reviewed home care and provided information through [place].    Meds ordered this encounter  Medications  . benzonatate (TESSALON) 200 MG capsule    Sig: Take 1 capsule (200 mg total) by mouth 2 (two) times daily as needed for cough.    Dispense:  20 capsule    Refill:  0  . guaiFENesin-codeine (ROBITUSSIN AC) 100-10 MG/5ML syrup    Sig: Take 5-10 mLs by mouth at bedtime as needed for cough.    Dispense:  180 mL    Refill:  0  . amoxicillin (AMOXIL) 500 MG capsule    Sig: Take 2 capsules (1,000 mg total) by mouth 2 (two) times daily.    Dispense:  40 capsule    Refill:  0

## 2019-10-15 NOTE — Assessment & Plan Note (Signed)
May be from mucus and fluid behind Tm but given unable to exam and immunocompromised... will treat with amoxicillin for possible right otitis media. Follow up for in person exam if not improving as expected.

## 2019-10-15 NOTE — Progress Notes (Signed)
VIRTUAL VISIT Due to national recommendations of social distancing due to [place] 19, a virtual visit is felt to be most appropriate for this patient at this time.   I connected with the patient on 10/15/19 at  3:40 PM EDT by virtual telehealth platform and verified that I am speaking with the correct person using two identifiers.   I discussed the limitations, risks, security and privacy concerns of performing an evaluation and management service by  virtual telehealth platform and the availability of in person appointments. I also discussed with the patient that there may be a patient responsible charge related to this service. The patient expressed understanding and agreed to proceed.  Patient location: Home Provider Location: [facility] Participants: Eliezer Lofts and Floy Leonell Lobdell.   Chief Complaint  Patient presents with  . Cough    x 3 days   . Sore Throat    x 3 days   . Ear Pain    x 1 day     History of Present Illness: Cough The current episode started in the past 7 days (3 days ago). The problem has been gradually worsening. The problem occurs constantly. The cough is productive of purulent sputum. Associated symptoms include myalgias, nasal congestion and rhinorrhea. Pertinent negatives include no chills, fever, sore throat or wheezing. Associated symptoms comments: gre mucus from nose. Post nasal drip  now with new ear pian in elft ear.. keeping him up at night . The symptoms are aggravated by lying down. He has tried OTC cough suppressant (tessalon perles, tylenol) for the symptoms. There is no history of asthma, bronchiectasis, bronchitis, COPD, emphysema, environmental allergies or pneumonia.    He is immunocompromised, s/P kidney transpalnt  no history of chronic lung disease.   S/P COVID19 vaccine  COVID 19 screen No recent travel or known exposure to [place]  The importance of social distancing was discussed today.   Review of Systems   Constitutional: Negative for chills and fever.  HENT: Positive for rhinorrhea. Negative for sore throat.   Respiratory: Negative for wheezing.   Musculoskeletal: Positive for myalgias.  Endo/Heme/Allergies: Negative for environmental allergies.      Past Medical History:  Diagnosis Date  . Allergy    seasonal  . Amnestic MCI (mild cognitive impairment with memory loss)   . Asthma   . Benign neoplasm of colon   . CAD (coronary artery disease) 2005   a.  Status post four-vessel CABG in 2005; b. 03/2018 MV: Small, fixed inferoapical and apical septal defect. No ischemia. EF 47% (60-65% by 05/2018 Echo).  . Chronic atrial fibrillation ([place])    a. CHADS2VASc 4 (CHF, HTN, DM, vascular disease)--> Eliquis; b. 09/2018 Zio: Chronic Afib.  . Chronic combined systolic and diastolic CHF (congestive heart failure) ([place])    a.  7/18 Echo: EF 45-50%; b. 05/2018 Echo: EF 60-65%, RVSP 74.8; c. 12/2018 Echo: EF 50-55%. Sev dil LA.  Marland Kitchen Chronic venous stasis dermatitis of both lower extremities   . Cytomegaloviral disease ([place]) 2017  . Depression   . Diabetes mellitus   . Diverticulosis   . Dyspnea   . Erectile dysfunction   . ESRD (end stage renal disease) ([place]) 2005   a. HD 2004-2012; b. S/p renal transplant in 2012  . GERD (gastroesophageal reflux disease)   . Gout   . Hyperlipidemia    low since renal failure  . Hypertension   . Kidney transplant status, cadaveric 2012  . Obesity   . PAH (pulmonary  artery hypertension) ([place])    a. 05/2018 Echo: RVSP 74.50mHg; b. 12/2018 RV not well visualized.  . Pneumonia   . Purpura ([place]   . Sleep apnea    bipap with oxygen 2 l  . Trigger finger   . Ulcer 05/2016   Left shin  . Wears glasses     reports that he quit smoking about 25 years ago. His smoking use included cigars. He has never used smokeless tobacco. He reports current alcohol use. He reports that he does not use drugs.   Current Outpatient Medications:  .  allopurinol (ZYLOPRIM) 100 MG  tablet, Take 0.5 tablets (50 mg total) by mouth daily. (Patient taking differently: Take 50 mg by mouth daily. 1.5 tablet), Disp: 30 tablet, Rfl: 1 .  amLODipine (NORVASC) 5 MG tablet, Take 1 tablet (5 mg total) by mouth daily., Disp: 90 tablet, Rfl: 2 .  apixaban (ELIQUIS) 5 MG TABS tablet, Take 1 tablet (5 mg total) by mouth 2 (two) times daily., Disp: 180 tablet, Rfl: 1 .  beclomethasone (QVAR) 80 MCG/ACT inhaler, Inhale 2 puffs into the lungs 2 (two) times daily., Disp: , Rfl:  .  buPROPion (WELLBUTRIN SR) 150 MG 12 hr tablet, Take 150 mg by mouth 2 (two) times daily., Disp: , Rfl:  .  dapagliflozin propanediol (FARXIGA) 10 MG TABS tablet, Take 10 mg by mouth daily before breakfast., Disp: 30 tablet, Rfl: 6 .  FERREX 150 150 MG capsule, Take by mouth., Disp: , Rfl:  .  fluticasone (FLONASE) 50 MCG/ACT nasal spray, PLACE 2 SPRAYS INTO BOTH NOSTRILS DAILY., Disp: 16 g, Rfl: 11 .  gabapentin (NEURONTIN) 300 MG capsule, Take 300 mg by mouth at bedtime. , Disp: , Rfl:  .  HUMALOG KWIKPEN 100 UNIT/ML KwikPen, Inject 5-10 Units into the skin 3 (three) times daily. Sliding scale, Disp: , Rfl:  .  insulin glargine (LANTUS) 100 unit/mL SOPN, Inject 40 Units into the skin at bedtime. , Disp: , Rfl:  .  lisinopril (ZESTRIL) 40 MG tablet, Take 0.5 tablets (20 mg total) by mouth daily., Disp: 90 tablet, Rfl: 3 .  metolazone (ZAROXOLYN) 2.5 MG tablet, Take 1 tablet (2.5 mg total) by mouth once a week., Disp: 6 tablet, Rfl: 5 .  montelukast (SINGULAIR) 10 MG tablet, TAKE 1 TABLET BY MOUTH AT BEDTIME., Disp: 90 tablet, Rfl: 3 .  Multiple Vitamin (MULTIVITAMIN WITH MINERALS) TABS tablet, Take 1 tablet by mouth daily., Disp: , Rfl:  .  mycophenolate (MYFORTIC) 180 MG EC tablet, Take 360 mg by mouth 2 (two) times daily., Disp: , Rfl:  .  omeprazole (PRILOSEC) 20 MG capsule, Take 1 capsule (20 mg total) by mouth daily., Disp: 90 capsule, Rfl: 3 .  potassium chloride (MICRO-K) 10 MEQ CR capsule, Take 4 capsules (40  mEq total) by mouth every morning AND 4 capsules (40 mEq total) every evening., Disp: 240 capsule, Rfl: 4 .  predniSONE (DELTASONE) 5 MG tablet, Take 5 mg by mouth daily., Disp: , Rfl:  .  rosuvastatin (CRESTOR) 10 MG tablet, TAKE 1 TABLET BY MOUTH DAILY, Disp: 90 tablet, Rfl: 0 .  tacrolimus (PROGRAF) 1 MG capsule, Take by mouth 2 (two) times daily. 3 mg in AM and 26min PM, Disp: , Rfl:  .  tamsulosin (FLOMAX) 0.4 MG CAPS capsule, Take 0.4 mg by mouth daily. , Disp: , Rfl:  .  torsemide (DEMADEX) 20 MG tablet, Take 4 tablets (80 mg total) by mouth 2 (two) times daily., Disp: 240 tablet, Rfl:  4 .  vitamin C (ASCORBIC ACID) 250 MG tablet, Take 1 tablet (250 mg total) by mouth daily., Disp: 30 tablet, Rfl: 0 .  zinc sulfate 220 (50 Zn) MG capsule, Take 1 capsule (220 mg total) by mouth 2 (two) times daily., Disp: 60 capsule, Rfl: 0   Observations/Objective: Blood pressure 124/77, pulse 76, temperature (!) 97.4 F (36.3 C), temperature source Temporal, height 5' 9" (1.753 m), weight (!) 262 lb (118.8 kg), SpO2 97 %.  Physical Exam  Physical Exam Constitutional:      General: The patient is not in acute distress. Pulmonary:     Effort: Pulmonary effort is normal. No respiratory distress.  Neurological:     Mental Status: The patient is alert and oriented to person, place, and time.  Psychiatric:        Mood and Affect: Mood normal.        Behavior: Behavior normal.   Assessment and Plan   Cough Likely viral infection.  Doubt COVID19 as he is vaccinated but if not improving consider testing. No clear sign of bacterial infection at this time ( except possible ear issue), but pt is high risk given immunocompromised.     No red flags/need for ER visit or in-person exam at respiratory clinic at this time..    Pt high risk for COVID complications. If SOB begins symptoms worsening or not improving as expected... have low threshold for in-person exam, if severe shortness of breath ER visit  recommended.  Can monitor Oxygen saturation at home with home monitor if able to obtain.  Go to ER if O2 sat < 90% on room air.  Reviewed home care and provided information through [place].    Right ear pain May be from mucus and fluid behind Tm but given unable to exam and immunocompromised... will treat with amoxicillin for possible right otitis media. Follow up for in person exam if not improving as expected.   I discussed the assessment and treatment plan with the patient. The patient was provided an opportunity to ask questions and all were answered. The patient agreed with the plan and demonstrated an understanding of the instructions.   The patient was advised to call back or seek an in-person evaluation if the symptoms worsen or if the condition fails to improve as anticipated.     Eliezer Lofts, MD

## 2019-10-20 ENCOUNTER — Other Ambulatory Visit: Payer: Self-pay

## 2019-10-20 ENCOUNTER — Ambulatory Visit (INDEPENDENT_AMBULATORY_CARE_PROVIDER_SITE_OTHER): Payer: 59 | Admitting: Urology

## 2019-10-20 ENCOUNTER — Encounter: Payer: Self-pay | Admitting: Urology

## 2019-10-20 VITALS — BP 114/67 | HR 84 | Ht 69.0 in | Wt 252.0 lb

## 2019-10-20 DIAGNOSIS — N5201 Erectile dysfunction due to arterial insufficiency: Secondary | ICD-10-CM | POA: Insufficient documentation

## 2019-10-20 HISTORY — DX: Erectile dysfunction due to arterial insufficiency: N52.01

## 2019-10-20 NOTE — Progress Notes (Signed)
08/24/19 10:46 AM   [name] 04-09-1960 [id number]  Referring provider: [name], MD [address] [place],  [place] [address] Chief Complaint  Patient presents with   Erectile Dysfunction    HPI: [name]. is a 59 y.o. male who presents today at the request of Dr. Silvio Pate for erectile dysfunction   - 6 month history of ED -Achieves firm erections but unable to maintain -Using 20 mg of generic cialis every other day  -He has tried a venous compression band without success -His friend recommended that he get a vacuum device  -His organic risk factors include diabetes, hypertention, coronary artery disease and antihypertensive medication  -SHIM 7/25 indicating severe ED -History of BPH on tamsulosin -PSA 10/05/2018 11428  PMH: Past Medical History:  Diagnosis Date   Allergy    seasonal   Amnestic MCI (mild cognitive impairment with memory loss)    Asthma    Benign neoplasm of colon    CAD (coronary artery disease) 2005   a.  Status post four-vessel CABG in 2005; b. 03/2018 MV: Small, fixed inferoapical and apical septal defect. No ischemia. EF 47% (60-65% by 05/2018 Echo).   Chronic atrial fibrillation ([place])    a. CHADS2VASc 4 (CHF, HTN, DM, vascular disease)--> Eliquis; b. 09/2018 Zio: Chronic Afib.   Chronic combined systolic and diastolic CHF (congestive heart failure) ([place])    a.  7/18 Echo: EF 45-50%; b. 05/2018 Echo: EF 60-65%, RVSP 74.8; c. 12/2018 Echo: EF 50-55%. Sev dil LA.   Chronic venous stasis dermatitis of both lower extremities    Cytomegaloviral disease ([place]) 2017   Depression    Diabetes mellitus    Diverticulosis    Dyspnea    Erectile dysfunction    ESRD (end stage renal disease) ([place]) 2005   a. HD 2004-2012; b. S/p renal transplant in 2012   GERD (gastroesophageal reflux disease)    Gout    Hyperlipidemia    low since renal failure   Hypertension    Kidney transplant status, cadaveric  2012   Obesity    PAH (pulmonary artery hypertension) ([place])    a. 05/2018 Echo: RVSP 74.80mHg; b. 12/2018 RV not well visualized.   Pneumonia    Purpura ([place]    Sleep apnea    bipap with oxygen 2 l   Trigger finger    Ulcer 05/2016   Left shin   Wears glasses     Surgical History: Past Surgical History:  Procedure Laterality Date   BARIATRIC SURGERY     [facility]  [facility]  05/[facility]/2019   CATARACT EXTRACTION W/PHACO Right 10/29/2017   Procedure: CATARACT EXTRACTION PHACO AND INTRAOCULAR LENS PLACEMENT ([place]  RIGHT DIABETIC;  Surgeon: BLeandrew Koyanagi MD;  [place]  Service: Ophthalmology;  Laterality: Right;  Diabetic - insulin   CATARACT EXTRACTION W/PHACO Left 11/18/2017   Procedure: CATARACT EXTRACTION PHACO AND INTRAOCULAR LENS PLACEMENT ([place] LEFT IVA/TOPICAL;  Surgeon: BLeandrew Koyanagi MD;  [place]  Service: Ophthalmology;  Laterality: Left;  DIABETES - insulin sleep apnea   COLONOSCOPY WITH PROPOFOL N/A 04/22/2013   Procedure: COLONOSCOPY WITH PROPOFOL;  Surgeon: DMilus Banister MD;  [facility];  Service: Endoscopy;  Laterality: N/A;   CORONARY ARTERY BYPASS GRAFT  2005   X 4   DG ANGIO AV SHUNT*L*     right and left upper arms   FASCIOTOMY  03/03/2012  Procedure: FASCIOTOMY;  Surgeon: [name], MD;  Location: [place];  Service: Orthopedics;  Laterality: Right;  FASCIOTOMY RIGHT SMALL FINGER   FASCIOTOMY Left 08/17/2013   Procedure: FASCIOTOMY LEFT RING;  Surgeon: [name], MD;  Location: [place];  Service: Orthopedics;  Laterality: Left;   INCISION AND DRAINAGE ABSCESS Left 10/15/2015   Procedure: INCISION AND DRAINAGE ABSCESS;  Surgeon: [name], MD;  Location: [facility];  Service: General;  Laterality: Left;   KIDNEY TRANSPLANT  09/13/2010   cadaver--at [facility] HEART CATH N/A 11/15/2016    Procedure: RIGHT HEART CATH;  Surgeon: [name], MD;  Location: [place] CV [facility];  Service: Cardiovascular;  Laterality: N/A;   RIGHT HEART CATH N/A 05/03/2019   Procedure: RIGHT HEART CATH;  Surgeon: [name], MD;  Location: [place] [facility];  Service: Cardiovascular;  Laterality: N/A;   TYMPANIC MEMBRANE REPAIR  1/12   left   VASECTOMY      Home Medications:  Allergies as of 10/20/2019      Reactions   Acetaminophen Other (See Comments)   BC of transplant   Contrast Media [iodinated Diagnostic Agents]    Kidney transplant   Ibuprofen Other (See Comments)   Due to kidney transplant Due to kidney transplant Due to kidney transplant      Medication List       Accurate as of October 20, 2019 10:46 AM. If you have any questions, ask your nurse or doctor.        allopurinol 100 MG tablet Commonly known as: ZYLOPRIM Take 0.5 tablets (50 mg total) by mouth daily. What changed: additional instructions   amLODipine 5 MG tablet Commonly known as: NORVASC Take 1 tablet (5 mg total) by mouth daily.   amoxicillin 500 MG capsule Commonly known as: AMOXIL Take 2 capsules (1,000 mg total) by mouth 2 (two) times daily.   apixaban 5 MG Tabs tablet Commonly known as: Eliquis Take 1 tablet (5 mg total) by mouth 2 (two) times daily.   beclomethasone 80 MCG/ACT inhaler Commonly known as: QVAR Inhale 2 puffs into the lungs 2 (two) times daily.   benzonatate 200 MG capsule Commonly known as: TESSALON Take 1 capsule (200 mg total) by mouth 2 (two) times daily as needed for cough.   buPROPion 150 MG 12 hr tablet Commonly known as: WELLBUTRIN SR Take 150 mg by mouth 2 (two) times daily.   dapagliflozin propanediol 10 MG Tabs tablet Commonly known as: FARXIGA Take 10 mg by mouth daily before breakfast.   Ferrex 150 150 MG capsule Generic drug: iron polysaccharides Take by mouth.   fluticasone 50 MCG/ACT nasal spray Commonly known as: FLONASE PLACE 2 SPRAYS  INTO BOTH NOSTRILS DAILY.   gabapentin 300 MG capsule Commonly known as: NEURONTIN Take 300 mg by mouth at bedtime.   guaiFENesin-codeine 100-10 MG/5ML syrup Commonly known as: ROBITUSSIN AC Take 5-10 mLs by mouth at bedtime as needed for cough.   HumaLOG KwikPen 100 UNIT/ML KwikPen Generic drug: insulin lispro Inject 5-10 Units into the skin 3 (three) times daily. Sliding scale   insulin glargine 100 unit/mL Sopn Commonly known as: LANTUS Inject 40 Units into the skin at bedtime.   lisinopril 40 MG tablet Commonly known as: ZESTRIL Take 0.5 tablets (20 mg total) by mouth daily.   metolazone 2.5 MG tablet Commonly known as: ZAROXOLYN Take 1 tablet (2.5 mg total) by mouth once a week.   montelukast  10 MG tablet Commonly known as: SINGULAIR TAKE 1 TABLET BY MOUTH AT BEDTIME.   multivitamin with minerals Tabs tablet Take 1 tablet by mouth daily.   mycophenolate 180 MG EC tablet Commonly known as: MYFORTIC Take 360 mg by mouth 2 (two) times daily.   omeprazole 20 MG capsule Commonly known as: PRILOSEC Take 1 capsule (20 mg total) by mouth daily.   potassium chloride 10 MEQ CR capsule Commonly known as: MICRO-K Take 4 capsules (40 mEq total) by mouth every morning AND 4 capsules (40 mEq total) every evening.   predniSONE 5 MG tablet Commonly known as: DELTASONE Take 5 mg by mouth daily.   rosuvastatin 10 MG tablet Commonly known as: CRESTOR TAKE 1 TABLET BY MOUTH DAILY   tacrolimus 1 MG capsule Commonly known as: PROGRAF Take by mouth 2 (two) times daily. 3 mg in AM and 36m in PM   tadalafil (PAH) 20 MG tablet Commonly known as: ADCIRCA Take 20 mg by mouth every other day.   tamsulosin 0.4 MG Caps capsule Commonly known as: FLOMAX Take 0.4 mg by mouth daily.   torsemide 20 MG tablet Commonly known as: DEMADEX Take 4 tablets (80 mg total) by mouth 2 (two) times daily.   vitamin C 250 MG tablet Commonly known as: ASCORBIC ACID Take 1 tablet (250 mg  total) by mouth daily.   zinc sulfate 220 (50 Zn) MG capsule Take 1 capsule (220 mg total) by mouth 2 (two) times daily.       Allergies:  Allergies  Allergen Reactions   Acetaminophen Other (See Comments)    BC of transplant   Contrast Media [Iodinated Diagnostic Agents]     Kidney transplant   Ibuprofen Other (See Comments)    Due to kidney transplant Due to kidney transplant Due to kidney transplant    Family History: Family History  Problem Relation Age of Onset   Heart disease Father    Kidney failure Father    Kidney disease Father    Diabetes Maternal Grandmother    Breast cancer Maternal Grandmother    Valvular heart disease Mother    Liver cancer Paternal Uncle    Liver cancer Paternal Grandmother    Prostate cancer Neg Hx     Social History:  reports that he quit smoking about 25 years ago. His smoking use included cigars. He has never used smokeless tobacco. He reports current alcohol use. He reports that he does not use drugs.   Physical Exam: BP 114/67    Pulse 84    Ht _0  (1.753 m)    Wt (!) 252 lb (114.3 kg)    BMI 37.21 kg/m   Constitutional:  Alert and oriented, No acute distress. HEENT: [place] AT, moist mucus membranes.  Trachea midline, no masses. Cardiovascular: No clubbing, cyanosis, or edema. Respiratory: Normal respiratory effort, no increased work of breathing. Skin: No rashes, bruises or suspicious lesions. Neurologic: Grossly intact, no focal deficits, moving all 4 extremities. Psychiatric: Normal mood and affect.   Assessment & Plan:    1. Erectile Dysfunction  -Additional treatment options were discussed including intracavernosal injections and vacuum erection devices -He was given a Rx for a vacuum device and websites for more information of vacuum devices and penile injections  [id number] [place] [phone] I, KJoneen BoersPeace, am acting as a  sEducation administratorfor Dr. SNicki ReaperC. Valon Glasscock.  I have reviewed the above documentation for accuracy and completeness,  and I agree with the above.   [name], MD

## 2019-10-21 ENCOUNTER — Other Ambulatory Visit: Payer: Self-pay | Admitting: Internal Medicine

## 2019-10-21 DIAGNOSIS — Z8616 Personal history of COVID-19: Secondary | ICD-10-CM | POA: Diagnosis not present

## 2019-10-21 DIAGNOSIS — E669 Obesity, unspecified: Secondary | ICD-10-CM | POA: Diagnosis not present

## 2019-10-21 DIAGNOSIS — Z7952 Long term (current) use of systemic steroids: Secondary | ICD-10-CM | POA: Diagnosis not present

## 2019-10-21 DIAGNOSIS — Z4822 Encounter for aftercare following kidney transplant: Secondary | ICD-10-CM | POA: Diagnosis not present

## 2019-10-21 DIAGNOSIS — I2510 Atherosclerotic heart disease of native coronary artery without angina pectoris: Secondary | ICD-10-CM | POA: Diagnosis not present

## 2019-10-21 DIAGNOSIS — D8989 Other specified disorders involving the immune mechanism, not elsewhere classified: Secondary | ICD-10-CM | POA: Diagnosis not present

## 2019-10-21 DIAGNOSIS — I5032 Chronic diastolic (congestive) heart failure: Secondary | ICD-10-CM | POA: Diagnosis not present

## 2019-10-21 DIAGNOSIS — E119 Type 2 diabetes mellitus without complications: Secondary | ICD-10-CM | POA: Diagnosis not present

## 2019-10-21 DIAGNOSIS — Z951 Presence of aortocoronary bypass graft: Secondary | ICD-10-CM | POA: Diagnosis not present

## 2019-10-21 DIAGNOSIS — M109 Gout, unspecified: Secondary | ICD-10-CM | POA: Diagnosis not present

## 2019-10-21 DIAGNOSIS — I4891 Unspecified atrial fibrillation: Secondary | ICD-10-CM | POA: Diagnosis not present

## 2019-10-21 DIAGNOSIS — I110 Hypertensive heart disease with heart failure: Secondary | ICD-10-CM | POA: Diagnosis not present

## 2019-10-21 DIAGNOSIS — I509 Heart failure, unspecified: Secondary | ICD-10-CM | POA: Diagnosis not present

## 2019-10-21 DIAGNOSIS — Z79899 Other long term (current) drug therapy: Secondary | ICD-10-CM | POA: Diagnosis not present

## 2019-10-21 DIAGNOSIS — Z794 Long term (current) use of insulin: Secondary | ICD-10-CM | POA: Diagnosis not present

## 2019-10-21 DIAGNOSIS — Z940 Kidney transplant status: Secondary | ICD-10-CM | POA: Diagnosis not present

## 2019-10-22 ENCOUNTER — Other Ambulatory Visit: Payer: Self-pay | Admitting: Rheumatology

## 2019-10-22 DIAGNOSIS — M0579 Rheumatoid arthritis with rheumatoid factor of multiple sites without organ or systems involvement: Secondary | ICD-10-CM | POA: Diagnosis not present

## 2019-10-22 DIAGNOSIS — Z940 Kidney transplant status: Secondary | ICD-10-CM | POA: Diagnosis not present

## 2019-10-22 DIAGNOSIS — M109 Gout, unspecified: Secondary | ICD-10-CM | POA: Diagnosis not present

## 2019-10-22 DIAGNOSIS — Z79899 Other long term (current) drug therapy: Secondary | ICD-10-CM | POA: Diagnosis not present

## 2019-10-26 DIAGNOSIS — G4733 Obstructive sleep apnea (adult) (pediatric): Secondary | ICD-10-CM | POA: Diagnosis not present

## 2019-10-26 DIAGNOSIS — E662 Morbid (severe) obesity with alveolar hypoventilation: Secondary | ICD-10-CM | POA: Diagnosis not present

## 2019-10-29 DIAGNOSIS — E1165 Type 2 diabetes mellitus with hyperglycemia: Secondary | ICD-10-CM | POA: Diagnosis not present

## 2019-11-01 ENCOUNTER — Other Ambulatory Visit: Payer: Self-pay | Admitting: Internal Medicine

## 2019-11-01 DIAGNOSIS — Z7952 Long term (current) use of systemic steroids: Secondary | ICD-10-CM | POA: Diagnosis not present

## 2019-11-01 DIAGNOSIS — E1169 Type 2 diabetes mellitus with other specified complication: Secondary | ICD-10-CM | POA: Diagnosis not present

## 2019-11-01 DIAGNOSIS — E1165 Type 2 diabetes mellitus with hyperglycemia: Secondary | ICD-10-CM | POA: Diagnosis not present

## 2019-11-01 DIAGNOSIS — I10 Essential (primary) hypertension: Secondary | ICD-10-CM | POA: Diagnosis not present

## 2019-11-01 DIAGNOSIS — Z794 Long term (current) use of insulin: Secondary | ICD-10-CM | POA: Diagnosis not present

## 2019-11-01 DIAGNOSIS — E113293 Type 2 diabetes mellitus with mild nonproliferative diabetic retinopathy without macular edema, bilateral: Secondary | ICD-10-CM | POA: Diagnosis not present

## 2019-11-01 DIAGNOSIS — E1142 Type 2 diabetes mellitus with diabetic polyneuropathy: Secondary | ICD-10-CM | POA: Diagnosis not present

## 2019-11-01 DIAGNOSIS — E669 Obesity, unspecified: Secondary | ICD-10-CM | POA: Diagnosis not present

## 2019-11-14 NOTE — Progress Notes (Signed)
Heart Failure Clinic Note   Date:  11/15/2019   ID:  [name]., DOB 18-Jul-1960, MRN [id number]   Provider location: [address] [place] Type of Visit:  Established patient  PCP:  [name], MD  Cardiologist:  [name] Primary HF: [name]   History of Present Illness: [name]. is a 59 y.o. male  with a history of CAD s/p CABG 2005, chronic atrial fibrillation on Eliquis, ESRD s/p renal transplant 2012, anc chronic diastolic CHF.  His renal function has been fairly stable, most recent creatinine 1.43.  He has been struggling with volume overload/CHF.  He has a history of ischemic cardiomyopathy with EF as low as 35-40% in the past but last echo in 3/20 showed EF 60-65%.  There is evidence for RV failure with pulmonary hypertension by echo.    Admitted 11/05/18 low grade fever and shortness of breath. HS Trop negative. Covid 19 negative. Blood CX negative. Placed on antibiotics and discharged to home the next day.   Admitted 10/27/51 with A/C Diastolic HF. Diuresed with IV lasix and transitioned to torsemide 80/40 mg . Discharge weight 252 pounds.   Admitted 06/2019 with left arm pain. Left upper extremity venous Doppler revealed occluded cephalic vein at the AV fistula outflow tract in the antecubital fossa with no other thrombus identified. Vascular evaluated. He continued on eliquis.  No plan for intervention. Also treated for acute gout flare. Discharged on prednisone taper.    Seen in f/u recently by [name] 5/24 and was volume overloaded. Wt up 7 lb. ReDs clip was 49%. This was after being started on prednisone for an acute gout flare. Torsemide was increased to 80 mg bid and he was advised to take 2 doses of 2.5 mg metolazone 2 days that week, then once weekly thereafter.   Today he returns for HF follow up.Overall feeling fine. Denies SOB/PND/Orthopnea. Using Bipap every night.  Appetite ok. Eating soups/salads. No fever or  chills. Weight at home 255-260  pounds. Taking all medications.   Labs (7/19): LDL 49 Labs (2/20): K 3.7, creatinine 1.43 Labs (4/20): K 4.3, creatinine 1.24 Labs (11/06/18): K 3.7 creatinine 1.23  Labs (05/07/19): K 3.8 Creatinine 1.5  Labs (07/18/19): K 4.3 Creatinine 1.42  Labs (5/21): K 3.3, creatinine 1.21   REDS clip  PMH: 1. CAD: s/p CABG x 4 in [place] in 2005.  - Cardiolite (1/20): EF 47%, small fixed apical septal defect, no ischemia.  Low risk study.  2. Asthma 3. Gout 4. Atrial fibrillation: Chronic.  5. ESRD s/p renal transplantation at [facility] in 2012.  6. Anemia of chronic disease 7. Type II diabetes 8. HTN 9. Hyperlipidemia 10. OSA: On bipap.  11. Ischemic cardiomyopathy: EF 35-40% in past.  - Cardiolite (1/20) with EF 47%.  - Echo (1/20): EF 50-55%, mild MR, RV moderately dilated with moderately decreased systolic function, PASP 73 mmHg, moderate TR.   - Echo (3/20): EF 60-65%, PASP 75 mmHg, mildly dilated RV with mildly decreased systolic function.  - Echo (2/21): EF 45-50%, global HK, moderately dilated RV with moderately decreased systolic function, D-shaped interventricular septum, dilated IVC.  - RHC (2/21): mean RA 19, PA 78/31 mean 51, mean PCWP 26, CI 3.26, PVR 3.25 12. Obesity: s/p gastric bypass in 2015.  13. Bradycardia with beta blocker.   Past Surgical History:  Procedure Laterality Date  . BARIATRIC SURGERY    . CARDIAC CATHETERIZATION  [place]  . CARDIAC CATHETERIZATION  05/03/2019  . CATARACT EXTRACTION W/PHACO Right 10/29/2017   Procedure: CATARACT EXTRACTION PHACO AND INTRAOCULAR LENS PLACEMENT ([place])  RIGHT DIABETIC;  Surgeon: [name], MD;  Location: [place];  Service: Ophthalmology;  Laterality: Right;  Diabetic - insulin  . CATARACT EXTRACTION W/PHACO Left 11/18/2017   Procedure: CATARACT EXTRACTION PHACO AND INTRAOCULAR LENS PLACEMENT ([place]) LEFT IVA/TOPICAL;  Surgeon: [name], MD;  Location: [place];  Service: Ophthalmology;  Laterality: Left;  DIABETES - insulin sleep apnea  . COLONOSCOPY WITH PROPOFOL N/A 04/22/2013   Procedure: COLONOSCOPY WITH PROPOFOL;  Surgeon: [name], MD;  [facility];  Service: Endoscopy;  Laterality: N/A;  . CORONARY ARTERY BYPASS GRAFT  2005   X 4  . DG ANGIO AV SHUNT*L*     right and left upper arms  . FASCIOTOMY  03/03/2012   Procedure: FASCIOTOMY;  Surgeon: [name], MD;  Location: [place];  Service: Orthopedics;  Laterality: Right;  FASCIOTOMY RIGHT SMALL FINGER  . FASCIOTOMY Left 08/17/2013   Procedure: FASCIOTOMY LEFT RING;  Surgeon: [name], MD;  Location: [place];  Service: Orthopedics;  Laterality: Left;  . INCISION AND DRAINAGE ABSCESS Left 10/15/2015   Procedure: INCISION AND DRAINAGE ABSCESS;  Surgeon: [name], MD;  Location: [facility];  Service: General;  Laterality: Left;  . KIDNEY TRANSPLANT  09/13/2010   cadaver--at [facility]  . RIGHT HEART CATH N/A 11/15/2016   Procedure: RIGHT HEART CATH;  Surgeon: [name], MD;  Location: [place] [facility];  Service: Cardiovascular;  Laterality: N/A;  . RIGHT HEART CATH N/A 05/03/2019   Procedure: RIGHT HEART CATH;  Surgeon: [name], MD;  Location: [place] [facility];  Service: Cardiovascular;  Laterality: N/A;  . TYMPANIC MEMBRANE REPAIR  1/12   left  . VASECTOMY       Current Outpatient Medications  Medication Sig Dispense Refill  . amLODipine (NORVASC) 5 MG tablet Take 1 tablet (5 mg total) by mouth daily. 90 tablet 2  . apixaban (ELIQUIS) 5 MG TABS tablet Take 1 tablet (5 mg total) by mouth 2 (two) times daily. 180 tablet 1  . beclomethasone (QVAR) 80 MCG/ACT inhaler Inhale 2 puffs into the lungs 2 (two) times daily.    . benzonatate (TESSALON) 200 MG capsule Take 1 capsule (200 mg total) by mouth 2 (two) times daily as needed for cough. 20 capsule 0  . buPROPion (WELLBUTRIN SR) 150 MG 12 hr tablet Take  150 mg by mouth 2 (two) times daily.    . dapagliflozin propanediol (FARXIGA) 10 MG TABS tablet Take 10 mg by mouth daily before breakfast. 30 tablet 6  . FERREX 150 150 MG capsule Take by mouth.    . fluticasone (FLONASE) 50 MCG/ACT nasal spray PLACE 2 SPRAYS INTO BOTH NOSTRILS DAILY. 16 g 11  . gabapentin (NEURONTIN) 300 MG capsule Take 300 mg by mouth 2 (two) times daily.     Marland Kitchen guaiFENesin-codeine (ROBITUSSIN AC) 100-10 MG/5ML syrup Take 5-10 mLs by mouth at bedtime as needed for cough. 180 mL 0  . HUMALOG KWIKPEN 100 UNIT/ML KwikPen Inject 5-10 Units into the skin 3 (three) times daily. Sliding scale    . insulin glargine (LANTUS) 100 unit/mL SOPN Inject 40 Units into the skin at bedtime.     . metolazone (ZAROXOLYN) 2.5 MG tablet Take 1 tablet (2.5 mg total) by mouth once a week. 6 tablet 5  .  montelukast (SINGULAIR) 10 MG tablet TAKE 1 TABLET BY MOUTH AT BEDTIME. 90 tablet 3  . Multiple Vitamin (MULTIVITAMIN WITH MINERALS) TABS tablet Take 1 tablet by mouth daily.    . mycophenolate (MYFORTIC) 180 MG EC tablet Take 360 mg by mouth 2 (two) times daily.    Marland Kitchen omeprazole (PRILOSEC) 20 MG capsule Take 1 capsule (20 mg total) by mouth daily. 90 capsule 3  . potassium chloride (MICRO-K) 10 MEQ CR capsule Take 4 capsules (40 mEq total) by mouth every morning AND 4 capsules (40 mEq total) every evening. 240 capsule 4  . predniSONE (DELTASONE) 5 MG tablet Take 5 mg by mouth daily.    . rosuvastatin (CRESTOR) 10 MG tablet TAKE 1 TABLET BY MOUTH DAILY 90 tablet 0  . tacrolimus (PROGRAF) 1 MG capsule Take by mouth 2 (two) times daily. 3 mg in AM and 40m in PM    . tadalafil, PAH, (ADCIRCA) 20 MG tablet Take 20 mg by mouth every other day.    . tamsulosin (FLOMAX) 0.4 MG CAPS capsule Take 0.4 mg by mouth daily.     .Marland Kitchentorsemide (DEMADEX) 20 MG tablet Take 4 tablets (80 mg total) by mouth 2 (two) times daily. 240 tablet 4  . vitamin C (ASCORBIC ACID) 250 MG tablet Take 1 tablet (250 mg total) by mouth  daily. 30 tablet 0  . zinc sulfate 220 (50 Zn) MG capsule Take 1 capsule (220 mg total) by mouth 2 (two) times daily. 60 capsule 0   No current facility-administered medications for this encounter.    Allergies:   Acetaminophen, Contrast media [iodinated diagnostic agents], and Ibuprofen   Social History:  The patient  reports that he quit smoking about 25 years ago. His smoking use included cigars. He has never used smokeless tobacco. He reports current alcohol use. He reports that he does not use drugs.   Family History:  The patient's family history includes Breast cancer in his maternal grandmother; Diabetes in his maternal grandmother; Heart disease in his father; Kidney disease in his father; Kidney failure in his father; Liver cancer in his paternal grandmother and paternal uncle; Valvular heart disease in his mother.   ROS:  Please see the history of present illness.   All other systems are personally reviewed and negative.  Vitals:   11/15/19 1123  BP: (!) 146/82  Pulse: 69  SpO2: 96%   Wt Readings from Last 3 Encounters:  11/15/19 118 kg (260 lb 3.2 oz)  10/20/19 (!) 114.3 kg (252 lb)  10/15/19 (!) 118.8 kg (262 lb)   PHYSICAL EXAM: General:  Well appearing. No resp difficulty HEENT: normal Neck: supple. no JVD. Carotids 2+ bilat; no bruits. No lymphadenopathy or thryomegaly appreciated. Cor: PMI nondisplaced. Regular rate & rhythm. No rubs, gallops or murmurs. Lungs: clear Abdomen: soft, nontender, nondistended. No hepatosplenomegaly. No bruits or masses. Good bowel sounds. Extremities: no cyanosis, clubbing, rash, edema Neuro: alert & orientedx3, cranial nerves grossly intact. moves all 4 extremities w/o difficulty. Affect pleasant   Recent Labs: 05/03/2019: TSH 1.615 06/01/2019: B Natriuretic Peptide 246.0 08/04/2019: ALT 19; Hemoglobin 11.6; Magnesium 1.6; Platelets 134 09/14/2019: BUN 35; Creatinine, Ser 1.41; Potassium 4.1; Sodium 140   Personally reviewed   Wt  Readings from Last 3 Encounters:  11/15/19 118 kg (260 lb 3.2 oz)  10/20/19 (!) 114.3 kg (252 lb)  10/15/19 (!) 118.8 kg (262 lb)    ASSESSMENT AND PLAN:  1. Heart failure with mid range EF: With prominent RV failure/pulmonary  hypertension. ?If RV failure is related to severe OSA, he was a remote smoker.  Echo in 2/21 with EF45-50%, moderately dilated RV with moderately decreased systolic function, moderate pulmonary hypertension.  Recent diuretic increase for volume overload 2/2 prednisolone use for acute gout flare.  NYHA II.  Reds Clip 35%. Volume status stable. Continue torsemide 80 mg bid -Continue metolazone weekly. - Continue KCl 40 qam/20 qpm, take an extra 20 mEq KCl on metolazone days.  - Continue current lisinopril and dapagliflozin - Not candidate for cardiomems with no hospitalizations in the last 12 months.  2. Atrial fibrillation: Chronic. Rate controlled.  - Continue Eliquis.  - no bleeding issues.  - Not on beta blocker with history of bradycardia.   3. CAD: S/p CABG 2005.  Low risk Cardiolite in 1/20.   - no anginal symptoms  - No ASA given Eliquis use.  - Continue Crestor 10 mg daily, good lipids in 7/19.  4. CKD: Stage 3.  S/p renal transplantation in 2012. He remains on mycophenolate and tacrolimus.   - he is followed by nephrology  5. OSA: Continue Bipap nightly. 6. HTN: Controlled on current regimen.  7. Pulmonary hypertension: Severe pulmonary hypertension by 3/20 echo.  RHC in 2/21 with moderate, primarily pulmonary venous hypertension (PVR 3.25 WU). Suspect that there is also a component of group 3 PH (OSA, ?unrecognized OHS).  8. Gout: Recent acute gout flare. Remains on low dose prednisone.    Follow up in 3-4 months with Dr Aundra Dubin.  Jeanmarie Hubert, NP  11/15/2019  [facility] [place] [address] [phone] (office) [phone] (fax)

## 2019-11-15 ENCOUNTER — Encounter (HOSPITAL_COMMUNITY): Payer: Self-pay

## 2019-11-15 ENCOUNTER — Ambulatory Visit (HOSPITAL_COMMUNITY)
Admission: RE | Admit: 2019-11-15 | Discharge: 2019-11-15 | Disposition: A | Discharge status: Home / Self Care | Payer: 59 | Source: Ambulatory Visit | Attending: Cardiology | Admitting: Cardiology

## 2019-11-15 ENCOUNTER — Other Ambulatory Visit: Payer: Self-pay

## 2019-11-15 VITALS — BP 146/82 | HR 69 | Wt 260.2 lb

## 2019-11-15 DIAGNOSIS — Z794 Long term (current) use of insulin: Secondary | ICD-10-CM | POA: Insufficient documentation

## 2019-11-15 DIAGNOSIS — I272 Pulmonary hypertension, unspecified: Secondary | ICD-10-CM | POA: Insufficient documentation

## 2019-11-15 DIAGNOSIS — Z7951 Long term (current) use of inhaled steroids: Secondary | ICD-10-CM | POA: Insufficient documentation

## 2019-11-15 DIAGNOSIS — G4733 Obstructive sleep apnea (adult) (pediatric): Secondary | ICD-10-CM | POA: Insufficient documentation

## 2019-11-15 DIAGNOSIS — Z951 Presence of aortocoronary bypass graft: Secondary | ICD-10-CM | POA: Insufficient documentation

## 2019-11-15 DIAGNOSIS — I5032 Chronic diastolic (congestive) heart failure: Secondary | ICD-10-CM | POA: Insufficient documentation

## 2019-11-15 DIAGNOSIS — Z7952 Long term (current) use of systemic steroids: Secondary | ICD-10-CM | POA: Diagnosis not present

## 2019-11-15 DIAGNOSIS — E785 Hyperlipidemia, unspecified: Secondary | ICD-10-CM | POA: Insufficient documentation

## 2019-11-15 DIAGNOSIS — J45909 Unspecified asthma, uncomplicated: Secondary | ICD-10-CM | POA: Insufficient documentation

## 2019-11-15 DIAGNOSIS — Z9884 Bariatric surgery status: Secondary | ICD-10-CM | POA: Diagnosis not present

## 2019-11-15 DIAGNOSIS — I255 Ischemic cardiomyopathy: Secondary | ICD-10-CM | POA: Diagnosis not present

## 2019-11-15 DIAGNOSIS — E1122 Type 2 diabetes mellitus with diabetic chronic kidney disease: Secondary | ICD-10-CM | POA: Insufficient documentation

## 2019-11-15 DIAGNOSIS — Z7901 Long term (current) use of anticoagulants: Secondary | ICD-10-CM | POA: Insufficient documentation

## 2019-11-15 DIAGNOSIS — Z79899 Other long term (current) drug therapy: Secondary | ICD-10-CM | POA: Insufficient documentation

## 2019-11-15 DIAGNOSIS — I132 Hypertensive heart and chronic kidney disease with heart failure and with stage 5 chronic kidney disease, or end stage renal disease: Secondary | ICD-10-CM | POA: Insufficient documentation

## 2019-11-15 DIAGNOSIS — I2510 Atherosclerotic heart disease of native coronary artery without angina pectoris: Secondary | ICD-10-CM | POA: Diagnosis not present

## 2019-11-15 DIAGNOSIS — Z940 Kidney transplant status: Secondary | ICD-10-CM | POA: Diagnosis not present

## 2019-11-15 DIAGNOSIS — I482 Chronic atrial fibrillation, unspecified: Secondary | ICD-10-CM | POA: Diagnosis not present

## 2019-11-15 DIAGNOSIS — M109 Gout, unspecified: Secondary | ICD-10-CM | POA: Insufficient documentation

## 2019-11-15 DIAGNOSIS — E669 Obesity, unspecified: Secondary | ICD-10-CM | POA: Diagnosis not present

## 2019-11-15 DIAGNOSIS — Z87891 Personal history of nicotine dependence: Secondary | ICD-10-CM | POA: Diagnosis not present

## 2019-11-15 LAB — BASIC METABOLIC PANEL
Anion gap: 13 (ref 5–15)
BUN: 59 mg/dL — ABNORMAL HIGH (ref 6–20)
CO2: 30 mmol/L (ref 22–32)
Calcium: 9.5 mg/dL (ref 8.9–10.3)
Chloride: 99 mmol/L (ref 98–111)
Creatinine, Ser: 1.64 mg/dL — ABNORMAL HIGH (ref 0.61–1.24)
GFR calc Af Amer: 52 mL/min — ABNORMAL LOW (ref >60)
GFR calc non Af Amer: 45 mL/min — ABNORMAL LOW (ref >60)
Glucose, Bld: 201 mg/dL — ABNORMAL HIGH (ref 70–99)
Potassium: 3.4 mmol/L — ABNORMAL LOW (ref 3.5–5.1)
Sodium: 142 mmol/L (ref 135–145)

## 2019-11-15 NOTE — Patient Instructions (Signed)
It was great to see you today! No medication changes are needed at this time.  Your physician recommends that you schedule a follow-up appointment in: 3 months with Dr Aundra Dubin  Do the following things EVERYDAY: 1) Weigh yourself in the morning before breakfast. Write it down and keep it in a log. 2) Take your medicines as prescribed 3) Eat low salt foods--Limit salt (sodium) to 2000 mg per day.  4) Stay as active as you can everyday 5) Limit all fluids for the day to less than 2 liters  If you have any questions or concerns before your next appointment please send [name] a message through [place] or call our office at [phone].    TO LEAVE A MESSAGE FOR THE NURSE SELECT OPTION 2, PLEASE LEAVE A MESSAGE INCLUDING: . YOUR NAME . DATE OF BIRTH . CALL BACK NUMBER . REASON FOR CALL**this is important as we prioritize the call backs  YOU WILL RECEIVE A CALL BACK THE SAME DAY AS LONG AS YOU CALL BEFORE 4:00 PM

## 2019-11-26 DIAGNOSIS — E662 Morbid (severe) obesity with alveolar hypoventilation: Secondary | ICD-10-CM | POA: Diagnosis not present

## 2019-11-26 DIAGNOSIS — G4733 Obstructive sleep apnea (adult) (pediatric): Secondary | ICD-10-CM | POA: Diagnosis not present

## 2019-11-29 DIAGNOSIS — E1165 Type 2 diabetes mellitus with hyperglycemia: Secondary | ICD-10-CM | POA: Diagnosis not present

## 2019-11-30 ENCOUNTER — Other Ambulatory Visit: Payer: Self-pay | Admitting: Family

## 2019-12-01 ENCOUNTER — Other Ambulatory Visit: Payer: Self-pay | Admitting: Family

## 2019-12-08 ENCOUNTER — Encounter: Payer: Self-pay | Admitting: Gastroenterology

## 2019-12-08 ENCOUNTER — Ambulatory Visit (INDEPENDENT_AMBULATORY_CARE_PROVIDER_SITE_OTHER): Payer: 59 | Admitting: Gastroenterology

## 2019-12-08 ENCOUNTER — Telehealth: Payer: Self-pay

## 2019-12-08 VITALS — BP 140/68 | HR 68 | Ht 68.75 in | Wt 268.1 lb

## 2019-12-08 DIAGNOSIS — I2510 Atherosclerotic heart disease of native coronary artery without angina pectoris: Secondary | ICD-10-CM

## 2019-12-08 DIAGNOSIS — I252 Old myocardial infarction: Secondary | ICD-10-CM | POA: Diagnosis not present

## 2019-12-08 DIAGNOSIS — Z86010 Personal history of colonic polyps: Secondary | ICD-10-CM | POA: Diagnosis not present

## 2019-12-08 MED ORDER — SUPREP BOWEL PREP KIT 17.5-3.13-1.6 GM/177ML PO SOLN
1.0000 | ORAL | 0 refills | Status: DC
Start: 2019-12-08 — End: 2020-01-31

## 2019-12-08 NOTE — Telephone Encounter (Signed)
Can hold Eliquis 2 days before colonoscopy.

## 2019-12-08 NOTE — Telephone Encounter (Signed)
Patient with diagnosis of afib on Eliquis for anticoagulation.    Procedure: colonoscopy Date of procedure: 01/21/20  CHADS2-VASc score of 4 (CHF, HTN, DM, CAD), also with occluded cephalic vein in AV fistula noted April 2021, no intervention planned since pt already on Eliquis.  CrCl 12m/min using adjusted body weight Platelet count 134K  Request is to hold Eliquis for 2 days prior to colonoscopy. Due to occluded vein in AV fistula noted in April 2021, will defer to MD for input.

## 2019-12-08 NOTE — Telephone Encounter (Signed)
[facility] Pre-operative Risk Assessment     Request for surgical clearance:     Endoscopy Procedure  What type of surgery is being performed?     Colonoscopy  When is this surgery scheduled?     01-21-20  What type of clearance is required ?   Pharmacy  Are there any medications that need to be held prior to surgery and how long? Eliquis x 2 days  Practice name and name of physician performing surgery?      [facility]  What is your office phone and fax number?      Phone- [phone]  [phone]  Anesthesia type (None, local, MAC, general) ?       MAC

## 2019-12-08 NOTE — Progress Notes (Signed)
HPI: This is a very pleasant 59 year old man whom I last saw about 6 years ago at the time of colonoscopy.  See those results summarized above  I did a colonoscopy for him January 2015 for Cologuard positive stool.  I removed 4 polyps from his colon, 3 of them were either adenomas or sessile serrated polyps.  I recommended repeat colonoscopy at 3-year interval.  I do not believe we have ever heard from him since then.  He has an ischemic cardiomyopathy, his ejection fraction was as low as 35 to 40% in the past but more recent echocardiogram March 2020 showed his ejection fraction was 60 to 65%.  Seems like he is frequently in the hospital.  Echocardiogram February 2021 ejection fraction 45 to 50%.  Status post renal transplant 2012  He is on Eliquis for atrial fibrillation  He does not have any troubles with his bowels, specifically no bleeding, no constipation, no significant abdominal pains.  His weight is overall stable.  Colon cancer does not run in his family   Review of systems: Pertinent positive and negative review of systems were noted in the above HPI section. All other review negative.   Past Medical History:  Diagnosis Date  . Allergy    seasonal  . Amnestic MCI (mild cognitive impairment with memory loss)   . Asthma   . Benign neoplasm of colon   . CAD (coronary artery disease) 2005   a.  Status post four-vessel CABG in 2005; b. 03/2018 MV: Small, fixed inferoapical and apical septal defect. No ischemia. EF 47% (60-65% by 05/2018 Echo).  . Chronic atrial fibrillation ([place])    a. CHADS2VASc 4 (CHF, HTN, DM, vascular disease)--> Eliquis; b. 09/2018 Zio: Chronic Afib.  . Chronic combined systolic and diastolic CHF (congestive heart failure) ([place])    a.  7/18 Echo: EF 45-50%; b. 05/2018 Echo: EF 60-65%, RVSP 74.8; c. 12/2018 Echo: EF 50-55%. Sev dil LA.  Marland Kitchen Chronic venous stasis dermatitis of both lower extremities   . Cytomegaloviral disease ([place]) 2017  . Depression   .  Diabetes mellitus   . Diverticulosis   . Dyspnea   . Erectile dysfunction   . ESRD (end stage renal disease) ([place]) 2005   a. HD 2004-2012; b. S/p renal transplant in 2012  . GERD (gastroesophageal reflux disease)   . Gout   . Hyperlipidemia    low since renal failure  . Hypertension   . Kidney transplant status, cadaveric 2012  . Obesity   . PAH (pulmonary artery hypertension) ([place])    a. 05/2018 Echo: RVSP 74.56mHg; b. 12/2018 RV not well visualized.  . Pneumonia   . Purpura ([place]   . Sleep apnea    bipap with oxygen 2 l  . Trigger finger   . Ulcer 05/2016   Left shin  . Wears glasses     Past Surgical History:  Procedure Laterality Date  . BARIATRIC SURGERY    . CARDIAC CATHETERIZATION     [facility]  . CARDIAC CATHETERIZATION  05/03/2019  . CATARACT EXTRACTION W/PHACO Right 10/29/2017   Procedure: CATARACT EXTRACTION PHACO AND INTRAOCULAR LENS PLACEMENT ([place]  RIGHT DIABETIC;  Surgeon: BLeandrew Koyanagi MD;  [facility]  Service: Ophthalmology;  Laterality: Right;  Diabetic - insulin  . CATARACT EXTRACTION W/PHACO Left 11/18/2017   Procedure: CATARACT EXTRACTION PHACO AND INTRAOCULAR LENS PLACEMENT ([place] IVA/TOPICAL;  Surgeon: BLeandrew Koyanagi MD;  [facility]  Service: Ophthalmology;  Laterality: Left;  DIABETES - insulin sleep apnea  . COLONOSCOPY WITH PROPOFOL N/A 04/22/2013   Procedure: COLONOSCOPY WITH PROPOFOL;  Surgeon: [name], MD;  [facility];  Service: Endoscopy;  Laterality: N/A;  . CORONARY ARTERY BYPASS GRAFT  2005   X 4  . DG ANGIO AV SHUNT*L*     right and left upper arms  . FASCIOTOMY  03/03/2012   Procedure: FASCIOTOMY;  Surgeon: [name], MD;  Location: [facility];  Service: Orthopedics;  Laterality: Right;  FASCIOTOMY RIGHT SMALL FINGER  . FASCIOTOMY Left 08/17/2013   Procedure: FASCIOTOMY LEFT RING;  Surgeon: [name], MD;  Location: [facility];   Service: Orthopedics;  Laterality: Left;  . INCISION AND DRAINAGE ABSCESS Left 10/15/2015   Procedure: INCISION AND DRAINAGE ABSCESS;  Surgeon: [name], MD;  Location: [facility];  Service: General;  Laterality: Left;  . KIDNEY TRANSPLANT  09/13/2010   cadaver--at [facility]  . RIGHT HEART CATH N/A 11/15/2016   Procedure: RIGHT HEART CATH;  Surgeon: [name], MD;  Location: [facility] [facility];  Service: Cardiovascular;  Laterality: N/A;  . RIGHT HEART CATH N/A 05/03/2019   Procedure: RIGHT HEART CATH;  Surgeon: [name], MD;  Location: [place] [facility] [facility];  Service: Cardiovascular;  Laterality: N/A;  . TYMPANIC MEMBRANE REPAIR  1/12   left  . VASECTOMY      Current Outpatient Medications  Medication Sig Dispense Refill  . amLODipine (NORVASC) 5 MG tablet Take 1 tablet (5 mg total) by mouth daily. 90 tablet 2  . apixaban (ELIQUIS) 5 MG TABS tablet Take 1 tablet (5 mg total) by mouth 2 (two) times daily. 180 tablet 1  . beclomethasone (QVAR) 80 MCG/ACT inhaler Inhale 2 puffs into the lungs 2 (two) times daily.    . benzonatate (TESSALON) 200 MG capsule Take 1 capsule (200 mg total) by mouth 2 (two) times daily as needed for cough. 20 capsule 0  . buPROPion (WELLBUTRIN SR) 150 MG 12 hr tablet Take 150 mg by mouth 2 (two) times daily.    . dapagliflozin propanediol (FARXIGA) 10 MG TABS tablet Take 10 mg by mouth daily before breakfast. 30 tablet 6  . FERREX 150 150 MG capsule Take by mouth.    . fluticasone (FLONASE) 50 MCG/ACT nasal spray PLACE 2 SPRAYS INTO BOTH NOSTRILS DAILY. 16 g 11  . guaiFENesin-codeine (ROBITUSSIN AC) 100-10 MG/5ML syrup Take 5-10 mLs by mouth at bedtime as needed for cough. 180 mL 0  . HUMALOG KWIKPEN 100 UNIT/ML KwikPen Inject 5-10 Units into the skin 3 (three) times daily. Sliding scale    . insulin glargine (LANTUS) 100 unit/mL SOPN Inject 40 Units into the skin at bedtime.     . metolazone (ZAROXOLYN) 2.5 MG tablet Take 1 tablet (2.5 mg total) by  mouth once a week. 6 tablet 5  . montelukast (SINGULAIR) 10 MG tablet TAKE 1 TABLET BY MOUTH AT BEDTIME. 90 tablet 3  . Multiple Vitamin (MULTIVITAMIN WITH MINERALS) TABS tablet Take 1 tablet by mouth daily.    . mycophenolate (MYFORTIC) 180 MG EC tablet Take 360 mg by mouth 2 (two) times daily.    Marland Kitchen omeprazole (PRILOSEC) 20 MG capsule Take 1 capsule (20 mg total) by mouth daily. 90 capsule 3  . potassium chloride (MICRO-K) 10 MEQ CR capsule Take 4 capsules (40 mEq total) by mouth every morning AND 4 capsules (40 mEq total) every evening. 240 capsule 4  . predniSONE (DELTASONE)  5 MG tablet Take 5 mg by mouth daily.    . rosuvastatin (CRESTOR) 10 MG tablet TAKE 1 TABLET BY MOUTH DAILY 90 tablet 2  . tacrolimus (PROGRAF) 1 MG capsule Take by mouth 2 (two) times daily. 3 mg in AM and 16m in PM    . tadalafil, PAH, (ADCIRCA) 20 MG tablet Take 20 mg by mouth every other day.    . tamsulosin (FLOMAX) 0.4 MG CAPS capsule Take 0.4 mg by mouth daily.     .Marland Kitchentorsemide (DEMADEX) 20 MG tablet Take 4 tablets (80 mg total) by mouth 2 (two) times daily. 240 tablet 4  . vitamin C (ASCORBIC ACID) 250 MG tablet Take 1 tablet (250 mg total) by mouth daily. 30 tablet 0  . zinc sulfate 220 (50 Zn) MG capsule Take 1 capsule (220 mg total) by mouth 2 (two) times daily. 60 capsule 0   No current facility-administered medications for this visit.    Allergies as of 12/08/2019 - Review Complete 12/08/2019  Allergen Reaction Noted  . Contrast media [iodinated diagnostic agents]  07/13/2019  . Ibuprofen Other (See Comments) 11/05/2018    Family History  Problem Relation Age of Onset  . Heart disease Father   . Kidney failure Father   . Kidney disease Father   . Diabetes Maternal Grandmother   . Breast cancer Maternal Grandmother   . Valvular heart disease Mother   . Liver cancer Paternal Uncle   . Liver cancer Paternal Grandmother   . Prostate cancer Neg Hx     Social History   Socioeconomic History  .  Marital status: Married    Spouse name: Not on file  . Number of children: 2  . Years of education: Not on file  . Highest education level: Not on file  Occupational History  . Occupation: MWarden/ranger unemployed    Comment: disabled due to kidney failure  Tobacco Use  . Smoking status: Former Smoker    Types: Cigars    Quit date: 09/02/1994    Years since quitting: 25.2  . Smokeless tobacco: Never Used  Vaping Use  . Vaping Use: Never used  Substance and Sexual Activity  . Alcohol use: Yes    Alcohol/week: 0.0 - 1.0 standard drinks    Comment: occasional- 3 in the past year  . Drug use: No  . Sexual activity: Not on file  Other Topics Concern  . Not on file  Social History Narrative   In school for culinary arts      Has living will   Wife is health care POA   Would accept resuscitation attempts   Hasn't considered tube feedings   Social Determinants of Health   Financial Resource Strain:   . Difficulty of Paying Living Expenses: Not on file  Food Insecurity:   . Worried About RCharity fundraiserin the Last Year: Not on file  . Ran Out of Food in the Last Year: Not on file  Transportation Needs:   . Lack of Transportation (Medical): Not on file  . Lack of Transportation (Non-Medical): Not on file  Physical Activity:   . Days of Exercise per Week: Not on file  . Minutes of Exercise per Session: Not on file  Stress:   . Feeling of Stress : Not on file  Social Connections:   . Frequency of Communication with Friends and Family: Not on file  . Frequency of Social Gatherings with Friends and Family: Not on  file  . Attends Religious Services: Not on file  . Active Member of Clubs or Organizations: Not on file  . Attends Archivist Meetings: Not on file  . Marital Status: Not on file  Intimate Partner Violence:   . Fear of Current or Ex-Partner: Not on file  . Emotionally Abused: Not on file  . Physically Abused: Not on file   . Sexually Abused: Not on file     Physical Exam: BP 140/68 (BP Location: Left Arm, Patient Position: Sitting, Cuff Size: Normal)   Pulse 68   Ht 5' 8.75" (1.746 m) Comment: height measured without shoes  Wt 268 lb 2 oz (121.6 kg)   BMI 39.88 kg/m  Constitutional: generally well-appearing Psychiatric: alert and oriented x3 Eyes: extraocular movements intact Mouth: oral pharynx moist, no lesions Neck: supple no lymphadenopathy Cardiovascular: heart regular rate and rhythm Lungs: clear to auscultation bilaterally Abdomen: soft, nontender, nondistended, no obvious ascites, no peritoneal signs, normal bowel sounds Extremities: no lower extremity edema bilaterally Skin: no lesions on visible extremities   Assessment and plan: 59 y.o. male with personal history of precancerous colon polyps  He is "overdue" for surveillance colonoscopy.  I recommended that we do that for him at his soonest convenience.  He understands there are elevated risks to colonoscopy since he is on a blood thinner I recommended that he stop the blood thinner for 2 days prior to the colonoscopy.  He understands it we will reach out to his cardiologist to make sure that they agree with that recommendation.  I see no reason for any further blood tests or imaging studies prior to then.  Please see the "Patient Instructions" section for addition details about the plan.   Owens Loffler, MD [facility] 12/08/2019, 10:41 AM  Cc: [name], MD  Total time on date of encounter was 40  minutes (this included time spent preparing to see the patient reviewing records; obtaining and/or reviewing separately obtained history; performing a medically appropriate exam and/or evaluation; counseling and educating the patient and family if present; ordering medications, tests or procedures if applicable; and documenting clinical information in the health record).

## 2019-12-08 NOTE — Telephone Encounter (Signed)
   Primary Cardiologist: [name], MD  Chart reviewed as part of pre-operative protocol coverage.   Per [name] and pharmacy's recommendations, patient can hold eliquis 2 days prior to his upcoming colonoscopy and should restart eliquis when cleared to do so by his gastroenterologist.   I will route this recommendation to the requesting party via [place] fax function and remove from pre-op pool.  Please call with questions.  Abigail Butts, PA-C 12/08/2019, 4:45 PM

## 2019-12-08 NOTE — Telephone Encounter (Signed)
Patient aware that it is OK for him to hold Eliquis for 2 days prior to colonoscopy scheduled for 01-21-20.  Patient advised to take last dose of Eliquis on the evening of 01-18-20.  Patient agreed to plan and verbalized understanding.  No further questions.

## 2019-12-08 NOTE — Patient Instructions (Signed)
If you are age 60 or older, your body mass index should be between 23-30. Your Body mass index is 39.88 kg/m. If this is out of the aforementioned range listed, please consider follow up with your Primary Care Provider.  If you are age 4 or younger, your body mass index should be between 19-25. Your Body mass index is 39.88 kg/m. If this is out of the aformentioned range listed, please consider follow up with your Primary Care Provider.   You have been scheduled for a colonoscopy. Please follow written instructions given to you at your visit today.  Please pick up your prep supplies at the pharmacy within the next 1-3 days. If you use inhalers (even only as needed), please bring them with you on the day of your procedure.  Due to recent changes in healthcare laws, you may see the results of your imaging and laboratory studies on MyChart before your provider has had a chance to review them.  We understand that in some cases there may be results that are confusing or concerning to you. Not all laboratory results come back in the same time frame and the provider may be waiting for multiple results in order to interpret others.  Please give [name] 48 hours in order for your provider to thoroughly review all the results before contacting the office for clarification of your results.   Thank you for entrusting me with your care and choosing [facility].  Dr Ardis Hughs

## 2019-12-09 ENCOUNTER — Encounter: Payer: Self-pay | Admitting: Cardiovascular Disease

## 2019-12-09 ENCOUNTER — Other Ambulatory Visit: Payer: Self-pay

## 2019-12-09 ENCOUNTER — Other Ambulatory Visit: Payer: Self-pay | Admitting: Cardiovascular Disease

## 2019-12-09 ENCOUNTER — Ambulatory Visit (INDEPENDENT_AMBULATORY_CARE_PROVIDER_SITE_OTHER): Payer: 59 | Admitting: Cardiovascular Disease

## 2019-12-09 VITALS — BP 120/72 | HR 60 | Ht 68.0 in | Wt 267.2 lb

## 2019-12-09 DIAGNOSIS — I482 Chronic atrial fibrillation, unspecified: Secondary | ICD-10-CM | POA: Diagnosis not present

## 2019-12-09 DIAGNOSIS — I10 Essential (primary) hypertension: Secondary | ICD-10-CM | POA: Diagnosis not present

## 2019-12-09 DIAGNOSIS — I252 Old myocardial infarction: Secondary | ICD-10-CM

## 2019-12-09 DIAGNOSIS — I5032 Chronic diastolic (congestive) heart failure: Secondary | ICD-10-CM | POA: Diagnosis not present

## 2019-12-09 DIAGNOSIS — E785 Hyperlipidemia, unspecified: Secondary | ICD-10-CM

## 2019-12-09 DIAGNOSIS — I2510 Atherosclerotic heart disease of native coronary artery without angina pectoris: Secondary | ICD-10-CM | POA: Diagnosis not present

## 2019-12-09 MED ORDER — METOLAZONE 5 MG PO TABS: 5.0000 mg | ORAL_TABLET | ORAL | 5 refills | Status: DC

## 2019-12-09 NOTE — Patient Instructions (Signed)
Medication Instructions:  Your physician has recommended you make the following change in your medication:   INCREASE Metolazone to 5 mg once a week. An Rx has been sent to your pharmacy.  Take 5 mg of Metolazone tomorrow. 30 minutes prior to your torsemide.  *If you need a refill on your cardiac medications before your next appointment, please call your pharmacy*   Lab Work: Your physician recommends that you return for lab work (bmet) on Monday 12/13/19.  Please have your lab drawn at the medical mall.   If you have labs (blood work) drawn today and your tests are completely normal, you will receive your results only by: Marland Kitchen MyChart Message (if you have MyChart) OR . A paper copy in the mail If you have any lab test that is abnormal or we need to change your treatment, we will call you to review the results.   Testing/Procedures: None ordered   Follow-Up: At [facility], you and your health needs are our priority.  As part of our continuing mission to provide you with exceptional heart care, we have created designated Provider Care Teams.  These Care Teams include your primary Cardiologist (physician) and Advanced Practice Providers (APPs -  Physician Assistants and Nurse Practitioners) who all work together to provide you with the care you need, when you need it.  We recommend signing up for the patient portal called "MyChart".  Sign up information is provided on this After Visit Summary.  MyChart is used to connect with patients for Virtual Visits (Telemedicine).  Patients are able to view lab/test results, encounter notes, upcoming appointments, etc.  Non-urgent messages can be sent to your provider as well.   To learn more about what you can do with MyChart, go to [email or web address].    Your next appointment:   6 month(s)  The format for your next appointment:   In Person  Provider:    You may see [name], MD or one of the following Advanced Practice  Providers on your designated Care Team:    Murray Hodgkins, NP  Christell Faith, PA-C  Marrianne Mood, PA-C    Other Instructions N/A

## 2019-12-09 NOTE — Progress Notes (Signed)
Cardiology Office Note   Date:  12/09/2019   ID:  Carl Straw., DOB October 14, 1960, MRN [id number]  PCP:  [name], MD  Cardiologist:   [name], MD   Chief Complaint  Patient presents with  . office visit    6 month F/U-would like to discuss possible fluid retention; Meds verbally reviewed with patient.      History of Present Illness: Carl Brady Schiller. is a 59 y.o. male   who is here today regarding complex cardiac issues.  He hashistory of CAD status post CABG in 2005, chronic combined systolic and diastolic CHF secondary to ICM with prior EF of 35 to 40%now improved to 50 to 55% by echo in 03/2018, chronic A. fib on Eliquis,pulmonary hypertension, ESRD previously on hemodialysis from 2004 through 2012 status post kidney transplantin 2012, anemia of chronic disease, DM2, HTN, HLD, beta-blocker induced bradycardia, obesity status post gastric bypass surgery in 11/2013, and sleep apneaon BiPAP.  Stress test on 04/10/2018 showed no significant ischemia, small region of fixed perfusion defect in the inferoapical and apical septal region possibly attenuation artifact, EF 47%, low risk study.   He had episodes of intermittent episodes of extreme dizziness with heart rate in the high 40s.  No syncope.  He had a ZIO Patch monitor done which showed no significant bradycardic events except during sleep time when his heart rate was in 30s.  Overall, there was no indication for a pacemaker.  He is followed by the advanced heart failure clinic.  He has known history of severe pulmonary hypertension and some right-sided failure.  He was hospitalized in early April with COVID-19 pneumonia.  He had left cephalic vein thrombosis on Doppler. He complains of retaining fluid mostly in his abdomen with increase in weight.  It appears that he is around 8 pounds above his dry weight.  He takes torsemide 80 mg twice daily and metolazone 2.5 mg once a week. No chest  pain.  Past Medical History:  Diagnosis Date  . Allergy    seasonal  . Amnestic MCI (mild cognitive impairment with memory loss)   . Asthma   . Benign neoplasm of colon   . CAD (coronary artery disease) 2005   a.  Status post four-vessel CABG in 2005; b. 03/2018 MV: Small, fixed inferoapical and apical septal defect. No ischemia. EF 47% (60-65% by 05/2018 Echo).  . Chronic atrial fibrillation ([place])    a. CHADS2VASc 4 (CHF, HTN, DM, vascular disease)--> Eliquis; b. 09/2018 Zio: Chronic Afib.  . Chronic combined systolic and diastolic CHF (congestive heart failure) ([place])    a.  7/18 Echo: EF 45-50%; b. 05/2018 Echo: EF 60-65%, RVSP 74.8; c. 12/2018 Echo: EF 50-55%. Sev dil LA.  Marland Kitchen Chronic venous stasis dermatitis of both lower extremities   . Cytomegaloviral disease ([place]) 2017  . Depression   . Diabetes mellitus   . Diverticulosis   . Dyspnea   . Erectile dysfunction   . ESRD (end stage renal disease) ([place]) 2005   a. HD 2004-2012; b. S/p renal transplant in 2012  . GERD (gastroesophageal reflux disease)   . Gout   . Hyperlipidemia    low since renal failure  . Hypertension   . Kidney transplant status, cadaveric 2012  . Obesity   . PAH (pulmonary artery hypertension) ([place])    a. 05/2018 Echo: RVSP 74.37mHg; b. 12/2018 RV not well visualized.  . Pneumonia   . Purpura ([facility]   . Sleep apnea  bipap with oxygen 2 l  . Trigger finger   . Ulcer 05/2016   Left shin  . Wears glasses     Past Surgical History:  Procedure Laterality Date  . BARIATRIC SURGERY    . CARDIAC CATHETERIZATION     [facility]  . CARDIAC CATHETERIZATION  05/03/2019  . CATARACT EXTRACTION W/PHACO Right 10/29/2017   Procedure: CATARACT EXTRACTION PHACO AND INTRAOCULAR LENS PLACEMENT ([place])  RIGHT DIABETIC;  Surgeon: [name], MD;  Location: [place];  Service: Ophthalmology;  Laterality: Right;  Diabetic - insulin  . CATARACT EXTRACTION W/PHACO Left 11/18/2017   Procedure: CATARACT EXTRACTION  PHACO AND INTRAOCULAR LENS PLACEMENT ([place]) LEFT IVA/TOPICAL;  Surgeon: [name], MD;  Location: [place];  Service: Ophthalmology;  Laterality: Left;  DIABETES - insulin sleep apnea  . COLONOSCOPY WITH PROPOFOL N/A 04/22/2013   Procedure: COLONOSCOPY WITH PROPOFOL;  Surgeon: [name], MD;  Location: [facility];  Service: Endoscopy;  Laterality: N/A;  . CORONARY ARTERY BYPASS GRAFT  2005   X 4  . DG ANGIO AV SHUNT*L*     right and left upper arms  . FASCIOTOMY  03/03/2012   Procedure: FASCIOTOMY;  Surgeon: [name], MD;  Location: [place];  Service: Orthopedics;  Laterality: Right;  FASCIOTOMY RIGHT SMALL FINGER  . FASCIOTOMY Left 08/17/2013   Procedure: FASCIOTOMY LEFT RING;  Surgeon: [name], MD;  Location: [name];  Service: Orthopedics;  Laterality: Left;  . INCISION AND DRAINAGE ABSCESS Left 10/15/2015   Procedure: INCISION AND DRAINAGE ABSCESS;  Surgeon: [name], MD;  Location: [facility];  Service: General;  Laterality: Left;  . KIDNEY TRANSPLANT  09/13/2010   cadaver--at [facility]  . RIGHT HEART CATH N/A 11/15/2016   Procedure: RIGHT HEART CATH;  Surgeon: [name], MD;  Location: [place] [facility];  Service: Cardiovascular;  Laterality: N/A;  . RIGHT HEART CATH N/A 05/03/2019   Procedure: RIGHT HEART CATH;  Surgeon: [name], MD;  Location: [place] [facility];  Service: Cardiovascular;  Laterality: N/A;  . TYMPANIC MEMBRANE REPAIR  1/12   left  . VASECTOMY       Current Outpatient Medications  Medication Sig Dispense Refill  . amLODipine (NORVASC) 5 MG tablet Take 1 tablet (5 mg total) by mouth daily. 90 tablet 2  . apixaban (ELIQUIS) 5 MG TABS tablet Take 1 tablet (5 mg total) by mouth 2 (two) times daily. 180 tablet 1  . beclomethasone (QVAR) 80 MCG/ACT inhaler Inhale 2 puffs into the lungs 2 (two) times daily.    . benzonatate (TESSALON) 200 MG capsule Take 1 capsule (200 mg total) by  mouth 2 (two) times daily as needed for cough. 20 capsule 0  . buPROPion (WELLBUTRIN SR) 150 MG 12 hr tablet Take 150 mg by mouth 2 (two) times daily.    . dapagliflozin propanediol (FARXIGA) 10 MG TABS tablet Take 10 mg by mouth daily before breakfast. 30 tablet 6  . FERREX 150 150 MG capsule Take 150 mg by mouth daily.     . fluticasone (FLONASE) 50 MCG/ACT nasal spray PLACE 2 SPRAYS INTO BOTH NOSTRILS DAILY. 16 g 11  . guaiFENesin-codeine (ROBITUSSIN AC) 100-10 MG/5ML syrup Take 5-10 mLs by mouth at bedtime as needed for cough. 180 mL 0  . HUMALOG KWIKPEN 100 UNIT/ML KwikPen Inject 5-10 Units into the skin 3 (three) times daily. Sliding scale    . insulin glargine (LANTUS) 100 unit/mL SOPN Inject  40 Units into the skin at bedtime.     . metolazone (ZAROXOLYN) 2.5 MG tablet Take 1 tablet (2.5 mg total) by mouth once a week. 6 tablet 5  . montelukast (SINGULAIR) 10 MG tablet TAKE 1 TABLET BY MOUTH AT BEDTIME. 90 tablet 3  . Multiple Vitamin (MULTIVITAMIN WITH MINERALS) TABS tablet Take 1 tablet by mouth daily.    . mycophenolate (MYFORTIC) 180 MG EC tablet Take 360 mg by mouth 2 (two) times daily.    . Na Sulfate-K Sulfate-Mg Sulf (SUPREP BOWEL PREP KIT) 17.5-3.13-1.6 GM/177ML SOLN Take 1 kit by mouth as directed. 324 mL 0  . omeprazole (PRILOSEC) 20 MG capsule Take 1 capsule (20 mg total) by mouth daily. 90 capsule 3  . potassium chloride (MICRO-K) 10 MEQ CR capsule Take 4 capsules (40 mEq total) by mouth every morning AND 4 capsules (40 mEq total) every evening. 240 capsule 4  . predniSONE (DELTASONE) 5 MG tablet Take 5 mg by mouth daily.    . rosuvastatin (CRESTOR) 10 MG tablet TAKE 1 TABLET BY MOUTH DAILY 90 tablet 2  . tacrolimus (PROGRAF) 1 MG capsule Take by mouth 2 (two) times daily. 3 mg in AM and 71m in PM    . tadalafil, PAH, (ADCIRCA) 20 MG tablet Take 20 mg by mouth every other day.    . tamsulosin (FLOMAX) 0.4 MG CAPS capsule Take 0.4 mg by mouth daily.     .Marland Kitchentorsemide (DEMADEX) 20  MG tablet Take 4 tablets (80 mg total) by mouth 2 (two) times daily. 240 tablet 4  . vitamin C (ASCORBIC ACID) 250 MG tablet Take 1 tablet (250 mg total) by mouth daily. 30 tablet 0  . zinc sulfate 220 (50 Zn) MG capsule Take 1 capsule (220 mg total) by mouth 2 (two) times daily. 60 capsule 0   No current facility-administered medications for this visit.    Allergies:   Contrast media [iodinated diagnostic agents] and Ibuprofen    Social History:  The patient  reports that he quit smoking about 25 years ago. His smoking use included cigars. He has never used smokeless tobacco. He reports current alcohol use. He reports that he does not use drugs.   Family History:  The patient's family history includes Breast cancer in his maternal grandmother; Diabetes in his maternal grandmother; Heart disease in his father; Kidney disease in his father; Kidney failure in his father; Liver cancer in his paternal grandmother and paternal uncle; Valvular heart disease in his mother.    ROS:  Please see the history of present illness.   Otherwise, review of systems are positive for none.   All other systems are reviewed and negative.    PHYSICAL EXAM: VS:  BP 120/72 (BP Location: Right Arm, Patient Position: Sitting, Cuff Size: Large)   Pulse 60   Ht _0  (1.727 m)   Wt 267 lb 4 oz (121.2 kg)   SpO2 97%   BMI 40.64 kg/m  , BMI Body mass index is 40.64 kg/m. GEN: Well nourished, well developed, in no acute distress  HEENT: normal  Neck: No JVD,  no carotid bruits, or masses Cardiac: Irregularly irregular; no  rubs, or gallops, trace leg  edema . 2/6 systolic ejection murmur in the aortic area. Respiratory:  clear to auscultation bilaterally, normal work of breathing GI: soft, nontender, , + BS.  Nondistended. MS: no deformity or atrophy  Skin: warm and dry, no rash Neuro:  Strength and sensation are intact Psych: euthymic mood,  full affect   EKG:  EKG is ordered today. The ekg ordered today  demonstrates atrial fibrillation with left axis deviation and nonspecific IVCD.  Heart rate is 60 bpm.  Recent Labs: 05/03/2019: TSH 1.615 06/01/2019: B Natriuretic Peptide 246.0 08/04/2019: ALT 19; Hemoglobin 11.6; Magnesium 1.6; Platelets 134 11/15/2019: BUN 59; Creatinine, Ser 1.64; Potassium 3.4; Sodium 142    Lipid Panel    Component Value Date/Time   CHOL 89 10/09/2017 0000   CHOL 161 03/07/2015 0823   TRIG 73 03/06/2016 0736   HDL 34 (L) 03/06/2016 0736   HDL 38 (L) 03/07/2015 0823   CHOLHDL 2.7 03/06/2016 0736   VLDL 15 03/06/2016 0736   LDLCALC 49 10/09/2017 0000   LDLCALC 105 (H) 03/07/2015 0823   LDLDIRECT 41.9 04/20/2012 1618      Wt Readings from Last 3 Encounters:  12/09/19 267 lb 4 oz (121.2 kg)  12/08/19 268 lb 2 oz (121.6 kg)  11/15/19 260 lb 3.2 oz (118 kg)       No flowsheet data found.    ASSESSMENT AND PLAN:  1.  Chronic systolic/diastolic heart failure: He appears to be volume overloaded today and he seems to be about 8 pounds above his dry weight.  ReDS vest reading was performed today and it was 44% which confirms volume overload.  Continue torsemide 80 mg twice daily.  I am going to increase metolazone to 5 mg to be used once a week as he does not seem to be responding very well to the 2.5 mg dose.  Check basic metabolic profile on Monday.  2. Chronic atrial fibrillation: Rate is controlled without medications. He is tolerating anticoagulation with Eliquis.    3. Coronary artery disease involving native coronary arteries without angina: Continue medical therapy.   4. Hyperlipidemia: Continue treatment with rosuvastatin 10 mg daily. Most recent LDL was 43.   5.  Status post kidney transplant: Most recent labs showed stable renal function with creatinine of 1.64..  6.  Essential hypertension: Blood pressure is well controlled on current medications.   Disposition: Continue follow-up with the heart failure clinic and follow-up with me in 6  months.  Signed,  [name], MD  12/09/2019 9:32 AM    [place]

## 2019-12-10 ENCOUNTER — Telehealth: Payer: Self-pay | Admitting: Cardiovascular Disease

## 2019-12-10 NOTE — Telephone Encounter (Signed)
Patient has been seen by a urologist for ED. Would defer to urology of his primary care.

## 2019-12-10 NOTE — Telephone Encounter (Signed)
*  STAT* If patient is at the pharmacy, call can be transferred to refill team.   1. Which medications need to be refilled? (please list name of each medication and dose if known) tadalafil 20 mg every other day  2. Which pharmacy/location (including street and city if local pharmacy) is medication to be sent to? [facility] pharmacy  3. Do they need a 30 day or 90 day supply? [place]

## 2019-12-10 NOTE — Telephone Encounter (Signed)
Spoke with patient to advise him to get refill from urologist or PCP. Patient states PCP deferred it to our office. He also states Ignacia Bayley told him this medication would "open up the veins to get better blood flow to his heart and help him breathe better". Please advise. Thank you!

## 2019-12-10 NOTE — Telephone Encounter (Signed)
OK to refill or defer to PCP? Last seen by [name] yesterday. Rx previously filled by Dr. Silvio Pate and Ignacia Bayley (01/2019). Currently listed under historical provider.  Thank you!

## 2019-12-10 NOTE — Telephone Encounter (Signed)
Message fwd to Dr. Arida for approval. 

## 2019-12-11 NOTE — Telephone Encounter (Signed)
Yes that is fine

## 2019-12-13 MED ORDER — TADALAFIL (PAH) 20 MG PO TABS: 20.0000 mg | ORAL_TABLET | ORAL | 0 refills | Status: DC

## 2019-12-15 ENCOUNTER — Other Ambulatory Visit: Payer: Self-pay | Admitting: Cardiovascular Disease

## 2019-12-15 NOTE — Telephone Encounter (Signed)
Pt's age 59, wt 121.2 kg, SCr 1.64, CrCl 82.11, last ov w/ MA 12/09/19.

## 2019-12-15 NOTE — Telephone Encounter (Signed)
Please review for refill. Thanks!  

## 2019-12-26 DIAGNOSIS — G4733 Obstructive sleep apnea (adult) (pediatric): Secondary | ICD-10-CM | POA: Diagnosis not present

## 2019-12-26 DIAGNOSIS — E662 Morbid (severe) obesity with alveolar hypoventilation: Secondary | ICD-10-CM | POA: Diagnosis not present

## 2019-12-29 ENCOUNTER — Other Ambulatory Visit: Payer: Self-pay | Admitting: Rheumatology

## 2019-12-29 DIAGNOSIS — E1165 Type 2 diabetes mellitus with hyperglycemia: Secondary | ICD-10-CM | POA: Diagnosis not present

## 2020-01-04 ENCOUNTER — Ambulatory Visit (INDEPENDENT_AMBULATORY_CARE_PROVIDER_SITE_OTHER): Payer: 59

## 2020-01-04 ENCOUNTER — Other Ambulatory Visit: Payer: Self-pay

## 2020-01-04 DIAGNOSIS — Z23 Encounter for immunization: Secondary | ICD-10-CM

## 2020-01-11 DIAGNOSIS — E1142 Type 2 diabetes mellitus with diabetic polyneuropathy: Secondary | ICD-10-CM | POA: Diagnosis not present

## 2020-01-11 DIAGNOSIS — I10 Essential (primary) hypertension: Secondary | ICD-10-CM | POA: Diagnosis not present

## 2020-01-11 DIAGNOSIS — E669 Obesity, unspecified: Secondary | ICD-10-CM | POA: Diagnosis not present

## 2020-01-11 DIAGNOSIS — Z794 Long term (current) use of insulin: Secondary | ICD-10-CM | POA: Diagnosis not present

## 2020-01-11 DIAGNOSIS — E1169 Type 2 diabetes mellitus with other specified complication: Secondary | ICD-10-CM | POA: Diagnosis not present

## 2020-01-11 DIAGNOSIS — E113293 Type 2 diabetes mellitus with mild nonproliferative diabetic retinopathy without macular edema, bilateral: Secondary | ICD-10-CM | POA: Diagnosis not present

## 2020-01-11 DIAGNOSIS — E1165 Type 2 diabetes mellitus with hyperglycemia: Secondary | ICD-10-CM | POA: Diagnosis not present

## 2020-01-11 DIAGNOSIS — Z7952 Long term (current) use of systemic steroids: Secondary | ICD-10-CM | POA: Diagnosis not present

## 2020-01-19 ENCOUNTER — Other Ambulatory Visit: Payer: Self-pay | Admitting: Internal Medicine

## 2020-01-21 ENCOUNTER — Encounter: Payer: 59 | Admitting: Gastroenterology

## 2020-01-26 ENCOUNTER — Other Ambulatory Visit: Payer: Self-pay

## 2020-01-26 DIAGNOSIS — E662 Morbid (severe) obesity with alveolar hypoventilation: Secondary | ICD-10-CM | POA: Diagnosis not present

## 2020-01-26 DIAGNOSIS — G4733 Obstructive sleep apnea (adult) (pediatric): Secondary | ICD-10-CM | POA: Diagnosis not present

## 2020-01-28 ENCOUNTER — Other Ambulatory Visit: Payer: Self-pay | Admitting: Cardiovascular Disease

## 2020-01-28 MED ORDER — TADALAFIL (PAH) 20 MG PO TABS: 20.0000 mg | ORAL_TABLET | ORAL | 1 refills | Status: DC

## 2020-01-29 DIAGNOSIS — E1165 Type 2 diabetes mellitus with hyperglycemia: Secondary | ICD-10-CM | POA: Diagnosis not present

## 2020-01-31 ENCOUNTER — Ambulatory Visit (AMBULATORY_SURGERY_CENTER): Payer: 59 | Admitting: Gastroenterology

## 2020-01-31 ENCOUNTER — Other Ambulatory Visit: Payer: Self-pay | Admitting: Rheumatology

## 2020-01-31 ENCOUNTER — Other Ambulatory Visit: Payer: Self-pay

## 2020-01-31 ENCOUNTER — Encounter: Payer: Self-pay | Admitting: Gastroenterology

## 2020-01-31 VITALS — BP 132/65 | HR 56 | Temp 97.5°F | Resp 19 | Ht 68.0 in | Wt 268.0 lb

## 2020-01-31 DIAGNOSIS — E119 Type 2 diabetes mellitus without complications: Secondary | ICD-10-CM | POA: Diagnosis not present

## 2020-01-31 DIAGNOSIS — Z86010 Personal history of colonic polyps: Secondary | ICD-10-CM

## 2020-01-31 DIAGNOSIS — I509 Heart failure, unspecified: Secondary | ICD-10-CM | POA: Diagnosis not present

## 2020-01-31 DIAGNOSIS — D122 Benign neoplasm of ascending colon: Secondary | ICD-10-CM

## 2020-01-31 MED ORDER — SODIUM CHLORIDE 0.9 % IV SOLN: 500.0000 mL | Freq: Once | INTRAVENOUS | Status: DC

## 2020-01-31 NOTE — Progress Notes (Signed)
Pt's states no medical or surgical changes since previsit or office visit.

## 2020-01-31 NOTE — Progress Notes (Signed)
Called to room to assist during endoscopic procedure.  Patient ID and intended procedure confirmed with present staff. Received instructions for my participation in the procedure from the performing physician.

## 2020-01-31 NOTE — Patient Instructions (Signed)
Handouts provided on polyps, diverticulosis and hemorrhoids.   It is OK to resume Eliquis today at prior dose.   Dr. Ardis Hughs' office to arrange C[facility]urgery referral to consider anal skin tag removal.   YOU HAD AN ENDOSCOPIC PROCEDURE TODAY AT [place]:   Refer to the procedure report that was given to you for any specific questions about what was found during the examination.  If the procedure report does not answer your questions, please call your gastroenterologist to clarify.  If you requested that your care partner not be given the details of your procedure findings, then the procedure report has been included in a sealed envelope for you to review at your convenience later.  YOU SHOULD EXPECT: Some feelings of bloating in the abdomen. Passage of more gas than usual.  Walking can help get rid of the air that was put into your GI tract during the procedure and reduce the bloating. If you had a lower endoscopy (such as a colonoscopy or flexible sigmoidoscopy) you may notice spotting of blood in your stool or on the toilet paper. If you underwent a bowel prep for your procedure, you may not have a normal bowel movement for a few days.  Please Note:  You might notice some irritation and congestion in your nose or some drainage.  This is from the oxygen used during your procedure.  There is no need for concern and it should clear up in a day or so.  SYMPTOMS TO REPORT IMMEDIATELY:   Following lower endoscopy (colonoscopy or flexible sigmoidoscopy):  Excessive amounts of blood in the stool  Significant tenderness or worsening of abdominal pains  Swelling of the abdomen that is new, acute  Fever of 100F or higher  For urgent or emergent issues, a gastroenterologist can be reached at any hour by calling [phone]. Do not use MyChart messaging for urgent concerns.    DIET:  We do recommend a small meal at first, but then you may proceed to your regular diet.  Drink plenty  of fluids but you should avoid alcoholic beverages for 24 hours.  ACTIVITY:  You should plan to take it easy for the rest of today and you should NOT DRIVE or use heavy machinery until tomorrow (because of the sedation medicines used during the test).    FOLLOW UP: Our staff will call the number listed on your records 48-72 hours following your procedure to check on you and address any questions or concerns that you may have regarding the information given to you following your procedure. If we do not reach you, we will leave a message.  We will attempt to reach you two times.  During this call, we will ask if you have developed any symptoms of COVID 19. If you develop any symptoms (ie: fever, flu-like symptoms, shortness of breath, cough etc.) before then, please call [phone].  If you test positive for Covid 19 in the 2 weeks post procedure, please call and report this information to [name].    If any biopsies were taken you will be contacted by phone or by letter within the next 1-3 weeks.  Please call us at [phone] if you have not heard about the biopsies in 3 weeks.    SIGNATURES/CONFIDENTIALITY: You and/or your care partner have signed paperwork which will be entered into your electronic medical record.  These signatures attest to the fact that that the information above on your After Visit Summary has been reviewed and is  understood.  Full responsibility of the confidentiality of this discharge information lies with you and/or your care-partner.

## 2020-01-31 NOTE — Progress Notes (Signed)
Report given to PACU, vss 

## 2020-01-31 NOTE — Op Note (Signed)
[place] Patient Name: [name] Procedure Date: 01/31/2020 3:16 PM MRN: [id number] Endoscopist: [name] , MD Age: 59 Referring MD:  Date of Birth: [birth year] Gender: Male Account #: [id number] Procedure:                Colonoscopy Indications:              High risk colon cancer surveillance: Personal                            history of colonic polyps; colonoscopy January 2015                            for Cologuard positive stool. I removed 4 polyps                            from his colon, 3 of them were either adenomas or                            sessile serrated polyps. Medicines:                Monitored Anesthesia Care Procedure:                Pre-Anesthesia Assessment:                           - Prior to the procedure, a History and Physical                            was performed, and patient medications and                            allergies were reviewed. The patient's tolerance of                            previous anesthesia was also reviewed. The risks                            and benefits of the procedure and the sedation                            options and risks were discussed with the patient.                            All questions were answered, and informed consent                            was obtained. Prior Anticoagulants: The patient has                            taken Eliquis (apixaban), last dose was 2 days                            prior to procedure. ASA Grade Assessment: III - A  patient with severe systemic disease. After                            reviewing the risks and benefits, the patient was                            deemed in satisfactory condition to undergo the                            procedure.                           After obtaining informed consent, the colonoscope                            was passed under direct vision. Throughout the                             procedure, the patient's blood pressure, pulse, and                            oxygen saturations were monitored continuously. The                            Colonoscope was introduced through the anus and                            advanced to the the cecum, identified by                            appendiceal orifice and ileocecal valve. The                            colonoscopy was performed without difficulty. The                            patient tolerated the procedure well. The quality                            of the bowel preparation was good. The ileocecal                            valve, appendiceal orifice, and rectum were                            photographed. Scope In: 3:26:06 PM Scope Out: 3:41:47 PM Scope Withdrawal Time: 0 hours 11 minutes 1 second  Total Procedure Duration: 0 hours 15 minutes 41 seconds  Findings:                 Two sessile polyps were found in the ascending                            colon. The polyps were 1 to 2 mm in size. These  polyps were removed with a cold snare. Polyp                            resection was incomplete, and the resected tissue                            was partially retrieved.                           Multiple small and large-mouthed diverticula were                            found in the entire colon.                           Internal hemorrhoids were found. The hemorrhoids                            were small.                           1cm long external anal skin tag.                           The exam was otherwise without abnormality on                            direct and retroflexion views. Complications:            No immediate complications. Estimated blood loss:                            None. Estimated Blood Loss:     Estimated blood loss: none. Impression:               - Two 1 to 2 mm polyps in the ascending colon,                            removed with a cold snare. Polyp  resection was                            incomplete, and the resected tissue was partially                            retrieved.                           - Diverticulosis in the entire examined colon.                           - Internal hemorrhoids.                           - 1cm long external anal skin tag.                           - The examination was otherwise  normal on direct                            and retroflexion views. Recommendation:           - Patient has a contact number available for                            emergencies. The signs and symptoms of potential                            delayed complications were discussed with the                            patient. Return to normal activities tomorrow.                            Written discharge instructions were provided to the                            patient.                           - Resume previous diet.                           - Continue present medications. It is OK to resume                            eliquis today.                           - Await pathology results.                           - Dr. Ardis Hughs' office to arrange C[facility]urgery referral                            to consider anal skin tag removal. [name], MD 01/31/2020 3:49:58 PM This report has been signed electronically.

## 2020-02-02 ENCOUNTER — Telehealth: Payer: Self-pay | Admitting: *Deleted

## 2020-02-02 NOTE — Telephone Encounter (Signed)
Follow up Call-  Call back number 01/31/2020  Post procedure Call Back phone  # [phone]  Permission to leave phone message Yes  Some recent data might be hidden     Patient questions:  Do you have a fever, pain , or abdominal swelling? No. Pain Score  0 *  Have you tolerated food without any problems? Yes.    Have you been able to return to your normal activities? Yes.    Do you have any questions about your discharge instructions: Diet   No. Medications  No. Follow up visit  No.  Do you have questions or concerns about your Care? No.  Actions: * If pain score is 4 or above: No action needed, pain <4.  1. Have you developed a fever since your procedure? no  2.   Have you had an respiratory symptoms (SOB or cough) since your procedure? no  3.   Have you tested positive for COVID 19 since your procedure no  4.   Have you had any family members/close contacts diagnosed with the COVID 19 since your procedure?  no   If yes to any of these questions please route to Joylene John, RN and Joella Prince, RN

## 2020-02-08 ENCOUNTER — Encounter: Payer: Self-pay | Admitting: Gastroenterology

## 2020-02-09 ENCOUNTER — Other Ambulatory Visit: Payer: Self-pay | Admitting: Internal Medicine

## 2020-02-11 ENCOUNTER — Other Ambulatory Visit: Payer: Self-pay | Admitting: Internal Medicine

## 2020-02-11 DIAGNOSIS — Z79899 Other long term (current) drug therapy: Secondary | ICD-10-CM | POA: Diagnosis not present

## 2020-02-11 DIAGNOSIS — N183 Chronic kidney disease, stage 3 unspecified: Secondary | ICD-10-CM | POA: Diagnosis not present

## 2020-02-11 DIAGNOSIS — G4733 Obstructive sleep apnea (adult) (pediatric): Secondary | ICD-10-CM | POA: Diagnosis not present

## 2020-02-11 DIAGNOSIS — E876 Hypokalemia: Secondary | ICD-10-CM | POA: Diagnosis not present

## 2020-02-11 DIAGNOSIS — E1122 Type 2 diabetes mellitus with diabetic chronic kidney disease: Secondary | ICD-10-CM | POA: Diagnosis not present

## 2020-02-11 DIAGNOSIS — M109 Gout, unspecified: Secondary | ICD-10-CM | POA: Diagnosis not present

## 2020-02-11 DIAGNOSIS — I482 Chronic atrial fibrillation, unspecified: Secondary | ICD-10-CM | POA: Diagnosis not present

## 2020-02-11 DIAGNOSIS — Z940 Kidney transplant status: Secondary | ICD-10-CM | POA: Diagnosis not present

## 2020-02-11 DIAGNOSIS — I130 Hypertensive heart and chronic kidney disease with heart failure and stage 1 through stage 4 chronic kidney disease, or unspecified chronic kidney disease: Secondary | ICD-10-CM | POA: Diagnosis not present

## 2020-02-11 DIAGNOSIS — I10 Essential (primary) hypertension: Secondary | ICD-10-CM | POA: Diagnosis not present

## 2020-02-11 DIAGNOSIS — R600 Localized edema: Secondary | ICD-10-CM | POA: Diagnosis not present

## 2020-02-11 DIAGNOSIS — Z4822 Encounter for aftercare following kidney transplant: Secondary | ICD-10-CM | POA: Diagnosis not present

## 2020-02-11 DIAGNOSIS — Z5181 Encounter for therapeutic drug level monitoring: Secondary | ICD-10-CM | POA: Diagnosis not present

## 2020-02-11 DIAGNOSIS — E8881 Metabolic syndrome: Secondary | ICD-10-CM | POA: Diagnosis not present

## 2020-02-11 DIAGNOSIS — I2510 Atherosclerotic heart disease of native coronary artery without angina pectoris: Secondary | ICD-10-CM | POA: Diagnosis not present

## 2020-02-11 DIAGNOSIS — K219 Gastro-esophageal reflux disease without esophagitis: Secondary | ICD-10-CM | POA: Diagnosis not present

## 2020-02-11 DIAGNOSIS — D849 Immunodeficiency, unspecified: Secondary | ICD-10-CM | POA: Diagnosis not present

## 2020-02-14 DIAGNOSIS — Z79899 Other long term (current) drug therapy: Secondary | ICD-10-CM | POA: Diagnosis not present

## 2020-02-14 DIAGNOSIS — Z940 Kidney transplant status: Secondary | ICD-10-CM | POA: Diagnosis not present

## 2020-02-14 DIAGNOSIS — M109 Gout, unspecified: Secondary | ICD-10-CM | POA: Diagnosis not present

## 2020-02-14 DIAGNOSIS — M0579 Rheumatoid arthritis with rheumatoid factor of multiple sites without organ or systems involvement: Secondary | ICD-10-CM | POA: Diagnosis not present

## 2020-02-15 ENCOUNTER — Ambulatory Visit (HOSPITAL_COMMUNITY)
Admission: RE | Admit: 2020-02-15 | Discharge: 2020-02-15 | Disposition: A | Discharge status: Home / Self Care | Payer: 59 | Source: Ambulatory Visit | Attending: Cardiology | Admitting: Cardiology

## 2020-02-15 ENCOUNTER — Telehealth (HOSPITAL_COMMUNITY): Payer: Self-pay

## 2020-02-15 ENCOUNTER — Other Ambulatory Visit: Payer: Self-pay | Admitting: Family

## 2020-02-15 ENCOUNTER — Telehealth (HOSPITAL_COMMUNITY): Payer: Self-pay | Admitting: Licensed Clinical Social Worker

## 2020-02-15 ENCOUNTER — Encounter (HOSPITAL_COMMUNITY): Payer: Self-pay | Admitting: Cardiology

## 2020-02-15 ENCOUNTER — Other Ambulatory Visit: Payer: Self-pay

## 2020-02-15 VITALS — BP 114/70 | HR 61 | Wt 269.8 lb

## 2020-02-15 DIAGNOSIS — I5032 Chronic diastolic (congestive) heart failure: Secondary | ICD-10-CM

## 2020-02-15 DIAGNOSIS — N186 End stage renal disease: Secondary | ICD-10-CM | POA: Insufficient documentation

## 2020-02-15 DIAGNOSIS — Z87891 Personal history of nicotine dependence: Secondary | ICD-10-CM | POA: Diagnosis not present

## 2020-02-15 DIAGNOSIS — I482 Chronic atrial fibrillation, unspecified: Secondary | ICD-10-CM | POA: Diagnosis not present

## 2020-02-15 DIAGNOSIS — E1122 Type 2 diabetes mellitus with diabetic chronic kidney disease: Secondary | ICD-10-CM | POA: Diagnosis not present

## 2020-02-15 DIAGNOSIS — R609 Edema, unspecified: Secondary | ICD-10-CM | POA: Diagnosis not present

## 2020-02-15 DIAGNOSIS — Z79899 Other long term (current) drug therapy: Secondary | ICD-10-CM | POA: Insufficient documentation

## 2020-02-15 DIAGNOSIS — I255 Ischemic cardiomyopathy: Secondary | ICD-10-CM | POA: Diagnosis not present

## 2020-02-15 DIAGNOSIS — M109 Gout, unspecified: Secondary | ICD-10-CM | POA: Diagnosis not present

## 2020-02-15 DIAGNOSIS — G4733 Obstructive sleep apnea (adult) (pediatric): Secondary | ICD-10-CM | POA: Diagnosis not present

## 2020-02-15 DIAGNOSIS — Z794 Long term (current) use of insulin: Secondary | ICD-10-CM | POA: Diagnosis not present

## 2020-02-15 DIAGNOSIS — I272 Pulmonary hypertension, unspecified: Secondary | ICD-10-CM | POA: Diagnosis not present

## 2020-02-15 DIAGNOSIS — I132 Hypertensive heart and chronic kidney disease with heart failure and with stage 5 chronic kidney disease, or end stage renal disease: Secondary | ICD-10-CM | POA: Insufficient documentation

## 2020-02-15 DIAGNOSIS — R0602 Shortness of breath: Secondary | ICD-10-CM | POA: Diagnosis not present

## 2020-02-15 DIAGNOSIS — I2510 Atherosclerotic heart disease of native coronary artery without angina pectoris: Secondary | ICD-10-CM | POA: Diagnosis not present

## 2020-02-15 DIAGNOSIS — Z940 Kidney transplant status: Secondary | ICD-10-CM | POA: Insufficient documentation

## 2020-02-15 DIAGNOSIS — Z8249 Family history of ischemic heart disease and other diseases of the circulatory system: Secondary | ICD-10-CM | POA: Diagnosis not present

## 2020-02-15 DIAGNOSIS — Z7901 Long term (current) use of anticoagulants: Secondary | ICD-10-CM | POA: Diagnosis not present

## 2020-02-15 MED ORDER — METOLAZONE 5 MG PO TABS
5.0000 mg | ORAL_TABLET | ORAL | 5 refills | Status: DC
Start: 2020-02-17 — End: 2020-02-23

## 2020-02-15 MED ORDER — POTASSIUM CHLORIDE ER 10 MEQ PO CPCR
40.0000 meq | ORAL_CAPSULE | Freq: Two times a day (BID) | ORAL | 4 refills | Status: DC
Start: 2020-02-15 — End: 2020-04-18

## 2020-02-15 MED ORDER — TORSEMIDE 20 MG PO TABS
100.0000 mg | ORAL_TABLET | Freq: Two times a day (BID) | ORAL | 4 refills | Status: DC
Start: 2020-02-15 — End: 2020-02-15

## 2020-02-15 MED ORDER — TORSEMIDE 20 MG PO TABS
100.0000 mg | ORAL_TABLET | Freq: Two times a day (BID) | ORAL | 4 refills | Status: DC
Start: 2020-02-15 — End: 2020-02-23

## 2020-02-15 NOTE — Telephone Encounter (Signed)
Introduced my self to [name].  Gave his appt time at same day surgery for tomorrow for IV lasix.  Gave him directions once he got to hospital.  Set up appt to meet him next week at his home.  Will discuss further about the program then.   [place] [phone]

## 2020-02-15 NOTE — Telephone Encounter (Signed)
CSW informed that physician wants pt referred to [facility] completed referral and sent to paramedic for enrollment  [name], [facility] Desk#: [phone] Cell#: [phone]

## 2020-02-15 NOTE — Progress Notes (Signed)
Per [facility] in [place], patient needs 75m IV tomorrow (02/16/20). Patient has potassium at home and clinic is doing lab work today.

## 2020-02-15 NOTE — Patient Instructions (Signed)
Tomorrow 02/16/20:   DO NOT TAKE AM dose of Torsemide  Take an extra 20 meq (1 tab) of Potassium  [facility] will let you know what time to go there for IV Lasix  Increase Torsemide to 100 mg Twice daily   Increase Metolazone to 5 mg every Tuesday and Friday  Take an extra 20 meq (1 tab) of Potassium every Tuesday and Friday when you take Metolazone  You have been referred to our [employer], Steffanie Dunn is the paramedic who will work with you, she will call you to arrange her visit  Your physician recommends that you schedule a follow-up appointment in: 1 week  If you have any questions or concerns before your next appointment please send [name] a message through [place] or call our office at [phone].    TO LEAVE A MESSAGE FOR THE NURSE SELECT OPTION 2, PLEASE LEAVE A MESSAGE INCLUDING:  YOUR NAME  DATE OF BIRTH  CALL BACK NUMBER  REASON FOR CALL**this is important as we prioritize the call backs  YOU WILL RECEIVE A CALL BACK THE SAME DAY AS LONG AS YOU CALL BEFORE 4:00 PM  At the [facility], you and your health needs are our priority. As part of our continuing mission to provide you with exceptional heart care, we have created designated Provider Care Teams. These Care Teams include your primary Cardiologist (physician) and Advanced Practice Providers (APPs- Physician Assistants and Nurse Practitioners) who all work together to provide you with the care you need, when you need it.   You may see any of the following providers on your designated Care Team at your next follow up:  Dr Glori Bickers  Dr Haynes Kerns, NP  Lyda Jester, [place]  Audry Riles, PharmD   Please be sure to bring in all your medications bottles to every appointment.

## 2020-02-15 NOTE — Progress Notes (Signed)
Heart Failure Note   Date:  02/15/2020   ID:  [name]., DOB October 11, 1960, MRN [id number]   Provider location: [address], [place] Type of Visit:  Established patient  PCP:  [name], MD  Cardiologist:  [name] Primary HF: [name]   History of Present Illness: [name]. is a 59 y.o. male  with a history of CAD s/p CABG 2005, chronic atrial fibrillation on Eliquis, ESRD s/p renal transplant 2012, anc chronic diastolic CHF.  His renal function has been fairly stable, most recent creatinine 1.43.  He has been struggling with volume overload/CHF.  He has a history of ischemic cardiomyopathy with EF as low as 35-40% in the past but last echo in 3/20 showed EF 60-65%.  There is evidence for RV failure with pulmonary hypertension by echo.    Admitted 11/05/18 low grade fever and shortness of breath. HS Trop negative. Covid 19 negative. Blood CX negative. Placed on antibiotics and discharged to home the next day.   Admitted 10/25/64 with A/C Diastolic HF. Diuresed with IV lasix and transitioned to torsemide 80/40 mg . Discharge weight 252 pounds.   Admitted 06/2019 with left arm pain. Left upper extremity venous Doppler revealed occluded cephalic vein at the AV fistula outflow tract in the antecubital fossa with no other thrombus identified. Vascular evaluated. He continued on eliquis.  No plan for intervention. Also treated for acute gout flare. Discharged on prednisone taper.    Patient seen today for followup of CHF.  He just finished prednisone for gout (had been on prednisone for a couple of months).  Weight is up 9 lbs since last appointment.  He has increased peripheral edema.  He is short of breath with stairs and inclines.  No dyspnea walking on flat ground.  No chest pain.  No orthopnea/PND, using Bipap at night.   Labs (7/19): LDL 49 Labs (2/20): K 3.7, creatinine 1.43 Labs (4/20): K 4.3, creatinine 1.24 Labs (11/06/18): K 3.7  creatinine 1.23  Labs (05/07/19): K 3.8 Creatinine 1.5  Labs (07/18/19): K 4.3 Creatinine 1.42  Labs (5/21): K 3.3, creatinine 1.21 Labs (11/21): K 3.6, creatinine 1.4  REDS clip 47%  PMH: 1. CAD: s/p CABG x 4 in [place] in 2005.  - Cardiolite (1/20): EF 47%, small fixed apical septal defect, no ischemia.  Low risk study.  2. Asthma 3. Gout 4. Atrial fibrillation: Chronic.  5. ESRD s/p renal transplantation at [facility] in 2012.  6. Anemia of chronic disease 7. Type II diabetes 8. HTN 9. Hyperlipidemia 10. OSA: On bipap.  11. Ischemic cardiomyopathy: EF 35-40% in past.  - Cardiolite (1/20) with EF 47%.  - Echo (1/20): EF 50-55%, mild MR, RV moderately dilated with moderately decreased systolic function, PASP 73 mmHg, moderate TR.   - Echo (3/20): EF 60-65%, PASP 75 mmHg, mildly dilated RV with mildly decreased systolic function.  - Echo (2/21): EF 45-50%, global HK, moderately dilated RV with moderately decreased systolic function, D-shaped interventricular septum, dilated IVC.  - RHC (2/21): mean RA 19, PA 78/31 mean 51, mean PCWP 26, CI 3.26, PVR 3.25 12. Obesity: s/p gastric bypass in 2015.  13. Bradycardia with beta blocker.   Past Surgical History:  Procedure Laterality Date  . BARIATRIC SURGERY    . CARDIAC CATHETERIZATION     [facility]  . CARDIAC CATHETERIZATION  05/03/2019  . CATARACT EXTRACTION W/PHACO Right 10/29/2017   Procedure: CATARACT EXTRACTION PHACO AND INTRAOCULAR  LENS PLACEMENT (IOC)  RIGHT DIABETIC;  Surgeon: [name], MD;  Location: [place];  Service: Ophthalmology;  Laterality: Right;  Diabetic - insulin  . CATARACT EXTRACTION W/PHACO Left 11/18/2017   Procedure: CATARACT EXTRACTION PHACO AND INTRAOCULAR LENS PLACEMENT ([place]) LEFT IVA/TOPICAL;  Surgeon: [name], MD;  Location: [place];  Service: Ophthalmology;  Laterality: Left;  DIABETES - insulin sleep apnea  . COLONOSCOPY WITH PROPOFOL N/A 04/22/2013   Procedure:  COLONOSCOPY WITH PROPOFOL;  Surgeon: [name], MD;  Location: [facility];  Service: Endoscopy;  Laterality: N/A;  . CORONARY ARTERY BYPASS GRAFT  2005   X 4  . DG ANGIO AV SHUNT*L*     right and left upper arms  . FASCIOTOMY  03/03/2012   Procedure: FASCIOTOMY;  Surgeon: [name], MD;  Location: [facility] [address];  Service: Orthopedics;  Laterality: Right;  FASCIOTOMY RIGHT SMALL FINGER  . FASCIOTOMY Left 08/17/2013   Procedure: FASCIOTOMY LEFT RING;  Surgeon: [name], MD;  Location: [place];  Service: Orthopedics;  Laterality: Left;  . INCISION AND DRAINAGE ABSCESS Left 10/15/2015   Procedure: INCISION AND DRAINAGE ABSCESS;  Surgeon: [name], MD;  Location: [facility];  Service: General;  Laterality: Left;  . KIDNEY TRANSPLANT  09/13/2010   cadaver--at [facility]  . RIGHT HEART CATH N/A 11/15/2016   Procedure: RIGHT HEART CATH;  Surgeon: [name], MD;  Location: [place];  Service: Cardiovascular;  Laterality: N/A;  . RIGHT HEART CATH N/A 05/03/2019   Procedure: RIGHT HEART CATH;  Surgeon: [name], MD;  Location: [place] [facility];  Service: Cardiovascular;  Laterality: N/A;  . TYMPANIC MEMBRANE REPAIR  1/12   left  . VASECTOMY       Current Outpatient Medications  Medication Sig Dispense Refill  . amLODipine (NORVASC) 5 MG tablet Take 1 tablet (5 mg total) by mouth daily. 90 tablet 2  . amoxicillin (AMOXIL) 500 MG capsule Take by mouth.    . beclomethasone (QVAR) 80 MCG/ACT inhaler Inhale 2 puffs into the lungs 2 (two) times daily.     . benzonatate (TESSALON) 200 MG capsule Take 1 capsule (200 mg total) by mouth 2 (two) times daily as needed for cough. 20 capsule 0  . buPROPion (WELLBUTRIN SR) 150 MG 12 hr tablet Take 150 mg by mouth 2 (two) times daily.    . colchicine 0.6 MG tablet Take by mouth.    . dapagliflozin propanediol (FARXIGA) 10 MG TABS tablet Take 10 mg by mouth daily before breakfast. 30 tablet 6    . ELIQUIS 5 MG TABS tablet TAKE 1 TABLET BY MOUTH TWICE DAILY 180 tablet 1  . FERREX 150 150 MG capsule Take 150 mg by mouth daily.     . fluticasone (FLONASE) 50 MCG/ACT nasal spray PLACE 2 SPRAYS INTO BOTH NOSTRILS DAILY. 16 g 11  . gabapentin (NEURONTIN) 300 MG capsule Take by mouth.    Marland Kitchen guaiFENesin-codeine (ROBITUSSIN AC) 100-10 MG/5ML syrup Take 5-10 mLs by mouth at bedtime as needed for cough. 180 mL 0  . HUMALOG KWIKPEN 100 UNIT/ML KwikPen Inject 5-10 Units into the skin 3 (three) times daily. Sliding scale    . insulin glargine (LANTUS) 100 unit/mL SOPN Inject 40 Units into the skin at bedtime.     Marland Kitchen lisinopril (ZESTRIL) 40 MG tablet Take by mouth.    Derrill Memo ON 02/17/2020] metolazone (ZAROXOLYN) 5 MG tablet Take 1 tablet (5 mg total)  by mouth 2 (two) times a week. Every Tuesday and Friday 10 tablet 5  . montelukast (SINGULAIR) 10 MG tablet TAKE 1 TABLET BY MOUTH AT BEDTIME. 90 tablet 3  . Multiple Vitamin (MULTIVITAMIN WITH MINERALS) TABS tablet Take 1 tablet by mouth daily.    . mycophenolate (MYFORTIC) 180 MG EC tablet Take 360 mg by mouth 2 (two) times daily.    Marland Kitchen omeprazole (PRILOSEC) 20 MG capsule Take 1 capsule (20 mg total) by mouth daily. 90 capsule 3  . potassium chloride (MICRO-K) 10 MEQ CR capsule Take 4 capsules (40 mEq total) by mouth 2 (two) times daily. Take an extra tab on Tue and Fri with metolazone 240 capsule 4  . rosuvastatin (CRESTOR) 10 MG tablet TAKE 1 TABLET BY MOUTH DAILY 90 tablet 2  . tacrolimus (PROGRAF) 1 MG capsule Take by mouth 2 (two) times daily. 3 mg in AM and 35m in PM    . tadalafil, PAH, (ADCIRCA) 20 MG tablet Take 1 tablet (20 mg total) by mouth every other day. 60 tablet 1  . tamsulosin (FLOMAX) 0.4 MG CAPS capsule Take 0.4 mg by mouth daily.     .Marland Kitchentorsemide (DEMADEX) 20 MG tablet Take 5 tablets (100 mg total) by mouth 2 (two) times daily. 300 tablet 4  . vitamin C (ASCORBIC ACID) 250 MG tablet Take 1 tablet (250 mg total) by mouth daily. 30  tablet 0  . zinc sulfate 220 (50 Zn) MG capsule Take 1 capsule (220 mg total) by mouth 2 (two) times daily. 60 capsule 0  . febuxostat (ULORIC) 40 MG tablet Take 40 mg by mouth daily.    . mometasone (NASONEX) 50 MCG/ACT nasal spray Place into the nose.    . spironolactone (ALDACTONE) 25 MG tablet Take by mouth.     No current facility-administered medications for this encounter.    Allergies:   Contrast media [iodinated diagnostic agents] and Ibuprofen   Social History:  The patient  reports that he quit smoking about 25 years ago. His smoking use included cigars. He has never used smokeless tobacco. He reports current alcohol use. He reports that he does not use drugs.   Family History:  The patient's family history includes Breast cancer in his maternal grandmother; Diabetes in his maternal grandmother; Heart disease in his father; Kidney disease in his father; Kidney failure in his father; Liver cancer in his paternal grandmother and paternal uncle; Valvular heart disease in his mother.   ROS:  Please see the history of present illness.   All other systems are personally reviewed and negative.  Vitals:   02/15/20 1109  BP: 114/70  Pulse: 61  SpO2: 93%   Wt Readings from Last 3 Encounters:  02/15/20 122.4 kg (269 lb 12.8 oz)  01/31/20 121.6 kg (268 lb)  12/09/19 121.2 kg (267 lb 4 oz)   Exam:   General: NAD Neck: Thick, JVP 12 cm, no thyromegaly or thyroid nodule.  Lungs: Clear to auscultation bilaterally with normal respiratory effort. CV: Nondisplaced PMI.  Heart regular S1/S2, no S3/S4, no murmur.  1+ edema to knees.  No carotid bruit.  Normal pedal pulses.  Abdomen: Soft, nontender, no hepatosplenomegaly, no distention.  Skin: Intact without lesions or rashes.  Neurologic: Alert and oriented x 3.  Psych: Normal affect. Extremities: No clubbing or cyanosis.  HEENT: Normal.   Recent Labs: 05/03/2019: TSH 1.615 06/01/2019: B Natriuretic Peptide 246.0 08/04/2019: ALT 19;  Hemoglobin 11.6; Magnesium 1.6; Platelets 134 11/15/2019: BUN 59; Creatinine, Ser  1.64; Potassium 3.4; Sodium 142  Personally reviewed   Wt Readings from Last 3 Encounters:  02/15/20 122.4 kg (269 lb 12.8 oz)  01/31/20 121.6 kg (268 lb)  12/09/19 121.2 kg (267 lb 4 oz)    ASSESSMENT AND PLAN:  1. Heart failure with mid range EF: With prominent RV failure/pulmonary hypertension. ?If RV failure is related to severe OSA, he was a remote smoker.  Echo in 2/21 with EF 45-50%, moderately dilated RV with moderately decreased systolic function, moderate pulmonary hypertension.  He is volume overloaded today with weight up 9 lbs.  NYHA class II-III symptoms.  He has been taking prednisone for gout (just stopped it); prednisone likely contributed to volume overload. REDS clip 47%.  - Increase torsemide to 100 mg bid.  - Increase metolazone to 5 mg twice a week on Tuesdays (took today) and Fridays.  - I will have him get a dose of Lasix 80 mg IV tomorrow morning to replace the am torsemide.  - Add an extra 20 mEq KCl on metolazone days.   - BMET today and again in 1 week.   - Continue current lisinopril and dapagliflozin - He would be a good Cardiomems candidate, will arrange for placement.  2. Atrial fibrillation: Chronic. Rate controlled.   - He will continue Eliquis.  - Not on beta blocker with history of bradycardia.   3. CAD: S/p CABG 2005.  Low risk Cardiolite in 1/20.   - No ASA given Eliquis use.  - Continue Crestor 10 mg daily, check lipids next appt.  4. CKD: Stage 3.  S/p renal transplantation in 2012. He remains on mycophenolate and tacrolimus.   - BMET today.  5. OSA: Continue Bipap every night.  6. HTN: Controlled.  7. Pulmonary hypertension: Severe pulmonary hypertension by 3/20 echo.  RHC in 2/21 with moderate, primarily pulmonary venous hypertension (PVR 3.25 WU). Suspect that there is also a component of group 3 PH (OSA, ?unrecognized OHS).  8. Gout: Would try to use colchicine  instead of prednisone if possible, think prednisone contributes to volume overload.     Followup in 1 week with APP to reassess volume status.    Signed, Loralie Champagne, MD  02/15/2020  [facility] [address] [facility] [place] [address] [phone] (office) [phone] (fax)

## 2020-02-16 ENCOUNTER — Ambulatory Visit
Admission: RE | Admit: 2020-02-16 | Discharge: 2020-02-16 | Disposition: A | Discharge status: Home / Self Care | Payer: 59 | Source: Ambulatory Visit | Attending: Family | Admitting: Family

## 2020-02-16 DIAGNOSIS — I5032 Chronic diastolic (congestive) heart failure: Secondary | ICD-10-CM | POA: Insufficient documentation

## 2020-02-16 MED ORDER — FUROSEMIDE 10 MG/ML IJ SOLN: 80.0000 mg | Freq: Once | INTRAMUSCULAR | Status: AC

## 2020-02-16 MED ORDER — FUROSEMIDE 10 MG/ML IJ SOLN
INTRAMUSCULAR | Status: AC
  Administered 2020-02-16: 80 mg via INTRAVENOUS
  Filled 2020-02-16: qty 8

## 2020-02-22 ENCOUNTER — Other Ambulatory Visit (HOSPITAL_COMMUNITY): Payer: Self-pay

## 2020-02-22 ENCOUNTER — Encounter (HOSPITAL_COMMUNITY): Payer: Self-pay

## 2020-02-22 NOTE — Progress Notes (Signed)
Bgl: 112 Today had a first visit with [place].  Very pleasant man and willing to take care of his self.  He received his IV lasix last week with no trouble.  He states did not urinate no more, still 7 lbs above his dry weight.  He states still has the fullness in abdomen and tightness in chest with activity.  Lungs are clear.  He states he gets short of breath when walking.  He has appt with [facility] tomorrow.  He has took the increased dose of torsemide and took his metolazone this morning early.  He advised has only urinated once since his meds at 5am, its now 9am.  He states just not urinating like he should be.  He states had kidney transplant 10 years ago and kidney function was checked in nov was normal.  Went over his diet, he watches what he eats except last night he ate br[place] stew, high In sodium.  He mostly eats in and cooks his food but a friend gave him the [employer].  He does not add salt, he cooks with Mrs Deliah Boston.  He limits his fluids to around 60 ounces sometimes goes to 80 ounces.  Discussed relaxing some days and elevating his legs, he states he stays active, not one that sits around and watches TV.  He says at night has bipap but gets short of breath and has to stand up to get his breath back.  Advised him he can buy a wedge to elevate him up some or use more pillows.  He said he sleeps on his side some.  He denies chest pain, headaches, or dizziness.  Will look at his report from [facility] tomorrow and will continue to visit for heart failure.  He was given a folder with info about the program and my number.  Made him aware of any problems or weight gain of 3 lbs or more to call.  Explained the program and he wants to be part of it.    [place] [phone]

## 2020-02-23 ENCOUNTER — Ambulatory Visit (HOSPITAL_COMMUNITY)
Admission: RE | Admit: 2020-02-23 | Discharge: 2020-02-23 | Disposition: A | Discharge status: Home / Self Care | Payer: 59 | Source: Ambulatory Visit | Attending: Cardiology | Admitting: Cardiology

## 2020-02-23 ENCOUNTER — Encounter (HOSPITAL_COMMUNITY): Payer: Self-pay | Admitting: Cardiology

## 2020-02-23 ENCOUNTER — Other Ambulatory Visit: Payer: Self-pay

## 2020-02-23 VITALS — BP 144/102 | HR 67 | Wt 266.6 lb

## 2020-02-23 DIAGNOSIS — I482 Chronic atrial fibrillation, unspecified: Secondary | ICD-10-CM

## 2020-02-23 DIAGNOSIS — R42 Dizziness and giddiness: Secondary | ICD-10-CM | POA: Insufficient documentation

## 2020-02-23 DIAGNOSIS — I5042 Chronic combined systolic (congestive) and diastolic (congestive) heart failure: Secondary | ICD-10-CM | POA: Diagnosis not present

## 2020-02-23 DIAGNOSIS — E1122 Type 2 diabetes mellitus with diabetic chronic kidney disease: Secondary | ICD-10-CM | POA: Insufficient documentation

## 2020-02-23 DIAGNOSIS — I132 Hypertensive heart and chronic kidney disease with heart failure and with stage 5 chronic kidney disease, or end stage renal disease: Secondary | ICD-10-CM | POA: Diagnosis not present

## 2020-02-23 DIAGNOSIS — Z7901 Long term (current) use of anticoagulants: Secondary | ICD-10-CM | POA: Insufficient documentation

## 2020-02-23 DIAGNOSIS — I255 Ischemic cardiomyopathy: Secondary | ICD-10-CM | POA: Diagnosis not present

## 2020-02-23 DIAGNOSIS — N186 End stage renal disease: Secondary | ICD-10-CM | POA: Insufficient documentation

## 2020-02-23 DIAGNOSIS — Z951 Presence of aortocoronary bypass graft: Secondary | ICD-10-CM | POA: Insufficient documentation

## 2020-02-23 DIAGNOSIS — Z87891 Personal history of nicotine dependence: Secondary | ICD-10-CM | POA: Diagnosis not present

## 2020-02-23 DIAGNOSIS — Z794 Long term (current) use of insulin: Secondary | ICD-10-CM | POA: Insufficient documentation

## 2020-02-23 DIAGNOSIS — M109 Gout, unspecified: Secondary | ICD-10-CM | POA: Diagnosis not present

## 2020-02-23 DIAGNOSIS — I770 Arteriovenous fistula, acquired: Secondary | ICD-10-CM | POA: Diagnosis not present

## 2020-02-23 DIAGNOSIS — Z79899 Other long term (current) drug therapy: Secondary | ICD-10-CM | POA: Insufficient documentation

## 2020-02-23 DIAGNOSIS — Z8249 Family history of ischemic heart disease and other diseases of the circulatory system: Secondary | ICD-10-CM | POA: Diagnosis not present

## 2020-02-23 DIAGNOSIS — I2510 Atherosclerotic heart disease of native coronary artery without angina pectoris: Secondary | ICD-10-CM | POA: Diagnosis not present

## 2020-02-23 DIAGNOSIS — Z940 Kidney transplant status: Secondary | ICD-10-CM | POA: Diagnosis not present

## 2020-02-23 DIAGNOSIS — I272 Pulmonary hypertension, unspecified: Secondary | ICD-10-CM | POA: Insufficient documentation

## 2020-02-23 DIAGNOSIS — G4733 Obstructive sleep apnea (adult) (pediatric): Secondary | ICD-10-CM | POA: Diagnosis not present

## 2020-02-23 LAB — BASIC METABOLIC PANEL
Anion gap: 18 — ABNORMAL HIGH (ref 5–15)
BUN: 67 mg/dL — ABNORMAL HIGH (ref 6–20)
CO2: 29 mmol/L (ref 22–32)
Calcium: 9.4 mg/dL (ref 8.9–10.3)
Chloride: 90 mmol/L — ABNORMAL LOW (ref 98–111)
Creatinine, Ser: 2.4 mg/dL — ABNORMAL HIGH (ref 0.61–1.24)
GFR, Estimated: 30 mL/min — ABNORMAL LOW (ref >60)
Glucose, Bld: 105 mg/dL — ABNORMAL HIGH (ref 70–99)
Potassium: 3.3 mmol/L — ABNORMAL LOW (ref 3.5–5.1)
Sodium: 137 mmol/L (ref 135–145)

## 2020-02-23 LAB — CBC
HCT: 41 % (ref 39.0–52.0)
Hemoglobin: 12.8 g/dL — ABNORMAL LOW (ref 13.0–17.0)
MCH: 23.9 pg — ABNORMAL LOW (ref 26.0–34.0)
MCHC: 31.2 g/dL (ref 30.0–36.0)
MCV: 76.5 fL — ABNORMAL LOW (ref 80.0–100.0)
Platelets: 173 10*3/uL (ref 150–400)
RBC: 5.36 MIL/uL (ref 4.22–5.81)
RDW: 15.5 % (ref 11.5–15.5)
WBC: 8 10*3/uL (ref 4.0–10.5)
nRBC: 0 % (ref 0.0–0.2)

## 2020-02-23 MED ORDER — METOLAZONE 5 MG PO TABS
5.0000 mg | ORAL_TABLET | ORAL | 5 refills | Status: DC
Start: 2020-02-23 — End: 2020-03-07

## 2020-02-23 MED ORDER — TORSEMIDE 20 MG PO TABS
80.0000 mg | ORAL_TABLET | Freq: Two times a day (BID) | ORAL | 3 refills | Status: DC
Start: 2020-02-25 — End: 2020-03-07

## 2020-02-23 NOTE — Patient Instructions (Addendum)
CHANGE Metolazone to one a week on Tuesdays only HOLD Torsemide 12/1, 12/2,  and the morning of 12/3 RESTART Torsemide 80 mg twice a day- first dose 12/3 in the afternoon HOLD Potassium 12/1, 12/2 and the morning of 12/3 RESTART Potassium on 12/3 in the after noon at your normal dose  STOP Lisinopril STOP Farxiga STOP Spironolactone   Labs today We will only contact you if something comes back abnormal or we need to make some changes. Otherwise no news is good news!  Your physician recommends that you schedule a follow-up appointment in: 5 days  If you have any questions or concerns before your next appointment please send [name] a message through [place] or call our office at [phone].    TO LEAVE A MESSAGE FOR THE NURSE SELECT OPTION 2, PLEASE LEAVE A MESSAGE INCLUDING: . YOUR NAME . DATE OF BIRTH . CALL BACK NUMBER . REASON FOR CALL**this is important as we prioritize the call backs  YOU WILL RECEIVE A CALL BACK THE SAME DAY AS LONG AS YOU CALL BEFORE 4:00 PM

## 2020-02-24 NOTE — Progress Notes (Signed)
Heart Failure Note   Date:  02/24/2020   ID:  Carl Pop., DOB 1960/11/12, MRN [id number]   Provider location: [address], [place] Type of Visit:  Established patient  PCP:  Carl Carbon, MD  Cardiologist:  [name] Primary HF: Dr. Aundra [name]   History of Present Illness: [name]. is a 59 y.o. male  with a history of CAD s/p CABG 2005, chronic atrial fibrillation on Eliquis, ESRD s/p renal transplant 2012, anc chronic diastolic CHF.  His renal function has been fairly stable, most recent creatinine 1.43.  He has been struggling with volume overload/CHF.  He has a history of ischemic cardiomyopathy with EF as low as 35-40% in the past but last echo in 3/20 showed EF 60-65%.  There is evidence for RV failure with pulmonary hypertension by echo.    Admitted 11/05/18 low grade fever and shortness of breath. HS Trop negative. Covid 19 negative. Blood CX negative. Placed on antibiotics and discharged to home the next day.   Admitted 04/30/81 with A/C Diastolic HF. Diuresed with IV lasix and transitioned to torsemide 80/40 mg . Discharge weight 252 pounds.   Admitted 06/2019 with left arm pain. Left upper extremity venous Doppler revealed occluded cephalic vein at the AV fistula outflow tract in the antecubital fossa with no other thrombus identified. Vascular evaluated. He continued on eliquis.  No plan for intervention. Also treated for acute gout flare. Discharged on prednisone taper.    At last visit, patient was significantly volume overloaded with weight up.  He had been on prednisone again for gout.  He is now off prednisone.  His torsemide was increased from 80 mg bid to 100 mg bid and metolazone was increased to twice weekly. REDS clip reading is down to 37% today from 47% last visit.  His main complaint today is dizziness.  He was orthostatic when checked in the office this afternoon.  Breathing is better, but he is still short of breath walking  more than about 50 feet. No chest pain. No orthopnea/PND. Using Bipap at night.   Labs (7/19): LDL 49 Labs (2/20): K 3.7, creatinine 1.43 Labs (4/20): K 4.3, creatinine 1.24 Labs (11/06/18): K 3.7 creatinine 1.23  Labs (05/07/19): K 3.8 Creatinine 1.5  Labs (07/18/19): K 4.3 Creatinine 1.42  Labs (5/21): K 3.3, creatinine 1.21 Labs (11/21): K 3.6, creatinine 1.4  REDS clip 37%  ECG (personally reviewed): atrial fibrillation, IVCD 176 msec (no changes from prior)  PMH: 1. CAD: s/p CABG x 4 in [place] in 2005.  - Cardiolite (1/20): EF 47%, small fixed apical septal defect, no ischemia.  Low risk study.  2. Asthma 3. Gout 4. Atrial fibrillation: Chronic.  5. ESRD s/p renal transplantation at [facility] in 2012.  6. Anemia of chronic disease 7. Type II diabetes 8. HTN 9. Hyperlipidemia 10. OSA: On bipap.  11. Ischemic cardiomyopathy: EF 35-40% in past.  - Cardiolite (1/20) with EF 47%.  - Echo (1/20): EF 50-55%, mild MR, RV moderately dilated with moderately decreased systolic function, PASP 73 mmHg, moderate TR.   - Echo (3/20): EF 60-65%, PASP 75 mmHg, mildly dilated RV with mildly decreased systolic function.  - Echo (2/21): EF 45-50%, global HK, moderately dilated RV with moderately decreased systolic function, D-shaped interventricular septum, dilated IVC.  - RHC (2/21): mean RA 19, PA 78/31 mean 51, mean PCWP 26, CI 3.26, PVR 3.25 12. Obesity: s/p gastric bypass in 2015.  13.  Bradycardia with beta blocker.   Past Surgical History:  Procedure Laterality Date  . BARIATRIC SURGERY    . CARDIAC CATHETERIZATION     [facility]  . CARDIAC CATHETERIZATION  05/03/2019  . CATARACT EXTRACTION W/PHACO Right 10/29/2017   Procedure: CATARACT EXTRACTION PHACO AND INTRAOCULAR LENS PLACEMENT ([place])  RIGHT DIABETIC;  Surgeon: Carl Koyanagi, MD;  Location: [facility] [place] [facility];  Service: Ophthalmology;  Laterality: Right;  Diabetic - insulin  . CATARACT EXTRACTION W/PHACO Left 11/18/2017    Procedure: CATARACT EXTRACTION PHACO AND INTRAOCULAR LENS PLACEMENT ([place]) LEFT IVA/TOPICAL;  Surgeon: Carl Koyanagi, MD;  Location: [place];  Service: Ophthalmology;  Laterality: Left;  DIABETES - insulin sleep apnea  . COLONOSCOPY WITH PROPOFOL N/A 04/22/2013   Procedure: COLONOSCOPY WITH PROPOFOL;  Surgeon: Carl Banister, MD;  Location: [facility];  Service: Endoscopy;  Laterality: N/A;  . CORONARY ARTERY BYPASS GRAFT  2005   X 4  . DG ANGIO AV SHUNT*L*     right and left upper arms  . FASCIOTOMY  03/03/2012   Procedure: FASCIOTOMY;  Surgeon: Carl Sours, MD;  Location: [facility];  Service: Orthopedics;  Laterality: Right;  FASCIOTOMY RIGHT SMALL FINGER  . FASCIOTOMY Left 08/17/2013   Procedure: FASCIOTOMY LEFT RING;  Surgeon: Carl Sours, MD;  Location: [facility];  Service: Orthopedics;  Laterality: Left;  . INCISION AND DRAINAGE ABSCESS Left 10/15/2015   Procedure: INCISION AND DRAINAGE ABSCESS;  Surgeon: Carl Husbands, MD;  Location: [facility];  Service: General;  Laterality: Left;  . KIDNEY TRANSPLANT  09/13/2010   cadaver--at [facility]  . RIGHT HEART CATH N/A 11/15/2016   Procedure: RIGHT HEART CATH;  Surgeon: Carl Hampshire, MD;  Location: [facility] [facility];  Service: Cardiovascular;  Laterality: N/A;  . RIGHT HEART CATH N/A 05/03/2019   Procedure: RIGHT HEART CATH;  Surgeon: Carl Dresser, MD;  Location: [facility];  Service: Cardiovascular;  Laterality: N/A;  . TYMPANIC MEMBRANE REPAIR  1/12   left  . VASECTOMY       Current Outpatient Medications  Medication Sig Dispense Refill  . amLODipine (NORVASC) 5 MG tablet Take 1 tablet (5 mg total) by mouth daily. 90 tablet 2  . beclomethasone (QVAR) 80 MCG/ACT inhaler Inhale 2 puffs into the lungs 2 (two) times daily.     [name] buPROPion (WELLBUTRIN SR) 150 MG 12 hr tablet Take 150 mg by mouth 2 (two) times daily.    . colchicine 0.6 MG tablet Take by mouth.    [name] 5  MG TABS tablet TAKE 1 TABLET BY MOUTH TWICE DAILY 180 tablet 1  . febuxostat (ULORIC) 40 MG tablet Take 40 mg by mouth daily.    [name] FERREX 150 150 MG capsule Take 150 mg by mouth daily.     . fluticasone (FLONASE) 50 MCG/ACT nasal spray PLACE 2 SPRAYS INTO BOTH NOSTRILS DAILY. 16 g 11  . gabapentin (NEURONTIN) 300 MG capsule Take by mouth.    [name] guaiFENesin-codeine (ROBITUSSIN AC) 100-10 MG/5ML syrup Take 5-10 mLs by mouth at bedtime as needed for cough. 180 mL 0  . HUMALOG KWIKPEN 100 UNIT/ML KwikPen Inject 5-10 Units into the skin 3 (three) times daily. Sliding scale    . insulin glargine (LANTUS) 100 unit/mL SOPN Inject 40 Units into the skin at bedtime.     . metolazone (ZAROXOLYN) 5 MG tablet Take 1 tablet (5 mg total) by mouth once a week. Every Tuesday 10 tablet 5  .  mometasone (NASONEX) 50 MCG/ACT nasal spray Place into the nose.    . montelukast (SINGULAIR) 10 MG tablet TAKE 1 TABLET BY MOUTH AT BEDTIME. 90 tablet 3  . Multiple Vitamin (MULTIVITAMIN WITH MINERALS) TABS tablet Take 1 tablet by mouth daily.    . mycophenolate (MYFORTIC) 180 MG EC tablet Take 360 mg by mouth 2 (two) times daily.    [name] omeprazole (PRILOSEC) 20 MG capsule Take 1 capsule (20 mg total) by mouth daily. 90 capsule 3  . potassium chloride (MICRO-K) 10 MEQ CR capsule Take 4 capsules (40 mEq total) by mouth 2 (two) times daily. Take an extra tab on Tue and Fri with metolazone 240 capsule 4  . rosuvastatin (CRESTOR) 10 MG tablet TAKE 1 TABLET BY MOUTH DAILY 90 tablet 2  . tacrolimus (PROGRAF) 1 MG capsule Take by mouth 2 (two) times daily. 3 mg in AM and 66m in PM    . tadalafil, PAH, (ADCIRCA) 20 MG tablet Take 1 tablet (20 mg total) by mouth every other day. 60 tablet 1  . tamsulosin (FLOMAX) 0.4 MG CAPS capsule Take 0.4 mg by mouth daily.     .Derrill MemoON 02/25/2020] torsemide (DEMADEX) 20 MG tablet Take 4 tablets (80 mg total) by mouth 2 (two) times daily. 240 tablet 3  . vitamin C (ASCORBIC ACID) 250 MG tablet Take 1  tablet (250 mg total) by mouth daily. 30 tablet 0  . zinc sulfate 220 (50 Zn) MG capsule Take 1 capsule (220 mg total) by mouth 2 (two) times daily. 60 capsule 0  . amoxicillin (AMOXIL) 500 MG capsule Take by mouth. (Patient not taking: Reported on 02/22/2020)    . benzonatate (TESSALON) 200 MG capsule Take 1 capsule (200 mg total) by mouth 2 (two) times daily as needed for cough. (Patient not taking: Reported on 02/22/2020) 20 capsule 0   No current facility-administered medications for this encounter.    Allergies:   Contrast media [iodinated diagnostic agents] and Ibuprofen   Social History:  The patient  reports that he quit smoking about 25 years ago. His smoking use included cigars. He has never used smokeless tobacco. He reports current alcohol use. He reports that he does not use drugs.   Family History:  The patient's family history includes Breast cancer in his maternal grandmother; Diabetes in his maternal grandmother; Heart disease in his father; Kidney disease in his father; Kidney failure in his father; Liver cancer in his paternal grandmother and paternal uncle; Valvular heart disease in his mother.   ROS:  Please see the history of present illness.   All other systems are personally reviewed and negative.  Vitals:   02/23/20 1425  BP: (!) 144/102  Pulse: 67  SpO2: 98%   Wt Readings from Last 3 Encounters:  02/23/20 120.9 kg (266 lb 9.6 oz)  02/22/20 122 kg (269 lb)  02/15/20 122.4 kg (269 lb 12.8 oz)   Exam:   General: NAD Neck: Thick, no JVD, no thyromegaly or thyroid nodule.  Lungs: Clear to auscultation bilaterally with normal respiratory effort. CV: Nondisplaced PMI.  Heart irregular S1/S2, no S3/S4, no murmur.  Trace ankle edema.  No carotid bruit.  Normal pedal pulses.  Abdomen: Soft, nontender, no hepatosplenomegaly, no distention.  Skin: Intact without lesions or rashes.  Neurologic: Alert and oriented x 3.  Psych: Normal affect. Extremities: No clubbing or  cyanosis.  HEENT: Normal.   Recent Labs: 05/03/2019: TSH 1.615 06/01/2019: B Natriuretic Peptide 246.0 08/04/2019: ALT 19; Magnesium 1.6  02/23/2020: BUN 67; Creatinine, Ser 2.40; Hemoglobin 12.8; Platelets 173; Potassium 3.3; Sodium 137  Personally reviewed   Wt Readings from Last 3 Encounters:  02/23/20 120.9 kg (266 lb 9.6 oz)  02/22/20 122 kg (269 lb)  02/15/20 122.4 kg (269 lb 12.8 oz)    ASSESSMENT AND PLAN:  1. Heart failure with mid range EF: With prominent RV failure/pulmonary hypertension. ?If RV failure is related to severe OSA, he was a remote smoker.  Echo in 2/21 with EF 45-50%, moderately dilated RV with moderately decreased systolic function, moderate pulmonary hypertension. Still NYHA class III, but REDS clip reading down considerably from last appt. He does not look volume overloaded on exam.  However, he has symptomatic orthostatic hypotension.  I am concerned that he has been over-diuresed.  - BMET today.  - Hold torsemide until Friday evening and restart it at that time at 80 mg bid.  - Hold Friday metolazone and decreased metolazone back to once weekly on Tuesdays.  - Hold lisinopril, dapagliflozin, and spironolactone until we see what creatinine looks like.  - He would be a good Cardiomems candidate, working on Government social research officer.  2. Atrial fibrillation: Chronic. Rate controlled.   - He will continue Eliquis.  - Not on beta blocker with history of bradycardia.   3. CAD: S/p CABG 2005.  Low risk Cardiolite in 1/20.   - No ASA given Eliquis use.  - Continue Crestor 10 mg daily, check lipids.  4. CKD: Stage 3.  S/p renal transplantation in 2012. He remains on mycophenolate and tacrolimus.   - BMET today.  5. OSA: Continue Bipap every night.  6. HTN: Controlled.  7. Pulmonary hypertension: Severe pulmonary hypertension by 3/20 echo.  RHC in 2/21 with moderate, primarily pulmonary venous hypertension (PVR 3.25 WU). Suspect that there is also a component of group 3  PH (OSA, ?unrecognized OHS).  8. Gout: Would try to use colchicine instead of prednisone if possible, think prednisone contributes to volume overload.     I am concerned he may have been over-diuresed with renal failure.  Med changes as above.  He will need followup in APP clinic Monday or Tuesday.   Signed, Loralie Champagne, MD  02/24/2020  [facility] [address] [facility] [place] [address] [phone] (office) [phone] (fax)

## 2020-02-25 DIAGNOSIS — E662 Morbid (severe) obesity with alveolar hypoventilation: Secondary | ICD-10-CM | POA: Diagnosis not present

## 2020-02-25 DIAGNOSIS — G4733 Obstructive sleep apnea (adult) (pediatric): Secondary | ICD-10-CM | POA: Diagnosis not present

## 2020-02-28 DIAGNOSIS — E1165 Type 2 diabetes mellitus with hyperglycemia: Secondary | ICD-10-CM | POA: Diagnosis not present

## 2020-02-29 ENCOUNTER — Ambulatory Visit (HOSPITAL_COMMUNITY): Admission: RE | Admit: 2020-02-29 | Payer: Medicare Other | Source: Ambulatory Visit | Admitting: Cardiology

## 2020-02-29 ENCOUNTER — Telehealth (HOSPITAL_COMMUNITY): Payer: Self-pay | Admitting: Cardiology
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
## 2020-03-01 ENCOUNTER — Other Ambulatory Visit: Payer: Self-pay | Admitting: Rheumatology

## 2020-03-02 ENCOUNTER — Encounter (HOSPITAL_COMMUNITY): Payer: Medicare Other | Admitting: Cardiology

## 2020-03-06 ENCOUNTER — Telehealth (HOSPITAL_COMMUNITY): Payer: Self-pay

## 2020-03-06 ENCOUNTER — Other Ambulatory Visit: Payer: Self-pay | Admitting: Internal Medicine

## 2020-03-06 ENCOUNTER — Telehealth: Payer: Self-pay | Admitting: *Deleted

## 2020-03-06 ENCOUNTER — Telehealth: Payer: Self-pay

## 2020-03-06 NOTE — Telephone Encounter (Signed)
Patient left a voicemail stating that he is having problems with his knee. Patient stated that it is hard for him to walk. Tried to call patient back and got his voicemail. Left a message for patient to call the office back.

## 2020-03-06 NOTE — Telephone Encounter (Signed)
Prior Auth started through [place]: [facility] PA Case ID: [id number] Rx #: [id number]  [employer] is reviewing your PA request. You may close this dialog, return to your dashboard, and perform other tasks.  To check for an update later, open this request again from your dashboard. If [employer] has not replied within 24 hours for urgent requests or within 48 hours for standard requests, please contact [employer] at [phone].
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
## 2020-03-06 NOTE — Telephone Encounter (Signed)
Noted, will evaluate.

## 2020-03-06 NOTE — Telephone Encounter (Signed)
Patient called back stating that he thought that he was having a flare-up with gout and started taking gout medication Friday. Patient stated that the gout medication is not helping and it is hard for him to walk. Patient scheduled to see Allie Bossier NP tomorrow 03/07/20 at 8:20 am. Patient had a negative covid screening. Dr. Silvio Pate does not have any appointments available tomorrow.

## 2020-03-07 ENCOUNTER — Ambulatory Visit (HOSPITAL_COMMUNITY)
Admission: RE | Admit: 2020-03-07 | Discharge: 2020-03-07 | Disposition: A | Discharge status: Home / Self Care | Payer: 59 | Source: Ambulatory Visit | Attending: Cardiology | Admitting: Cardiology

## 2020-03-07 ENCOUNTER — Other Ambulatory Visit (HOSPITAL_COMMUNITY): Payer: Self-pay | Admitting: Cardiology

## 2020-03-07 ENCOUNTER — Ambulatory Visit (INDEPENDENT_AMBULATORY_CARE_PROVIDER_SITE_OTHER): Payer: 59 | Admitting: Primary Care

## 2020-03-07 ENCOUNTER — Ambulatory Visit (INDEPENDENT_AMBULATORY_CARE_PROVIDER_SITE_OTHER)
Admission: RE | Admit: 2020-03-07 | Discharge: 2020-03-07 | Disposition: A | Discharge status: Home / Self Care | Payer: 59 | Source: Ambulatory Visit | Attending: Primary Care | Admitting: Primary Care

## 2020-03-07 ENCOUNTER — Encounter (HOSPITAL_COMMUNITY): Payer: Self-pay | Admitting: Cardiology

## 2020-03-07 ENCOUNTER — Other Ambulatory Visit: Payer: Self-pay

## 2020-03-07 ENCOUNTER — Encounter: Payer: Self-pay | Admitting: Primary Care

## 2020-03-07 VITALS — BP 132/74 | HR 76 | Temp 97.6°F | Ht 68.0 in | Wt 269.0 lb

## 2020-03-07 VITALS — BP 138/80 | HR 69 | Wt 267.0 lb

## 2020-03-07 DIAGNOSIS — M109 Gout, unspecified: Secondary | ICD-10-CM | POA: Diagnosis not present

## 2020-03-07 DIAGNOSIS — Z7951 Long term (current) use of inhaled steroids: Secondary | ICD-10-CM | POA: Insufficient documentation

## 2020-03-07 DIAGNOSIS — Z87891 Personal history of nicotine dependence: Secondary | ICD-10-CM | POA: Diagnosis not present

## 2020-03-07 DIAGNOSIS — E785 Hyperlipidemia, unspecified: Secondary | ICD-10-CM | POA: Diagnosis not present

## 2020-03-07 DIAGNOSIS — G4733 Obstructive sleep apnea (adult) (pediatric): Secondary | ICD-10-CM | POA: Insufficient documentation

## 2020-03-07 DIAGNOSIS — M25562 Pain in left knee: Secondary | ICD-10-CM | POA: Diagnosis not present

## 2020-03-07 DIAGNOSIS — Z9884 Bariatric surgery status: Secondary | ICD-10-CM | POA: Insufficient documentation

## 2020-03-07 DIAGNOSIS — N186 End stage renal disease: Secondary | ICD-10-CM | POA: Insufficient documentation

## 2020-03-07 DIAGNOSIS — J45909 Unspecified asthma, uncomplicated: Secondary | ICD-10-CM | POA: Insufficient documentation

## 2020-03-07 DIAGNOSIS — I255 Ischemic cardiomyopathy: Secondary | ICD-10-CM | POA: Insufficient documentation

## 2020-03-07 DIAGNOSIS — E669 Obesity, unspecified: Secondary | ICD-10-CM | POA: Diagnosis not present

## 2020-03-07 DIAGNOSIS — I2510 Atherosclerotic heart disease of native coronary artery without angina pectoris: Secondary | ICD-10-CM | POA: Diagnosis not present

## 2020-03-07 DIAGNOSIS — I5032 Chronic diastolic (congestive) heart failure: Secondary | ICD-10-CM | POA: Diagnosis not present

## 2020-03-07 DIAGNOSIS — Z794 Long term (current) use of insulin: Secondary | ICD-10-CM | POA: Insufficient documentation

## 2020-03-07 DIAGNOSIS — I252 Old myocardial infarction: Secondary | ICD-10-CM

## 2020-03-07 DIAGNOSIS — I132 Hypertensive heart and chronic kidney disease with heart failure and with stage 5 chronic kidney disease, or end stage renal disease: Secondary | ICD-10-CM | POA: Diagnosis not present

## 2020-03-07 DIAGNOSIS — I482 Chronic atrial fibrillation, unspecified: Secondary | ICD-10-CM | POA: Insufficient documentation

## 2020-03-07 DIAGNOSIS — I272 Pulmonary hypertension, unspecified: Secondary | ICD-10-CM | POA: Diagnosis not present

## 2020-03-07 DIAGNOSIS — Z7952 Long term (current) use of systemic steroids: Secondary | ICD-10-CM | POA: Diagnosis not present

## 2020-03-07 DIAGNOSIS — Z951 Presence of aortocoronary bypass graft: Secondary | ICD-10-CM | POA: Insufficient documentation

## 2020-03-07 DIAGNOSIS — Z7901 Long term (current) use of anticoagulants: Secondary | ICD-10-CM | POA: Diagnosis not present

## 2020-03-07 DIAGNOSIS — E1122 Type 2 diabetes mellitus with diabetic chronic kidney disease: Secondary | ICD-10-CM | POA: Diagnosis not present

## 2020-03-07 DIAGNOSIS — Z940 Kidney transplant status: Secondary | ICD-10-CM | POA: Diagnosis not present

## 2020-03-07 DIAGNOSIS — M1A0620 Idiopathic chronic gout, left knee, without tophus (tophi): Secondary | ICD-10-CM

## 2020-03-07 DIAGNOSIS — Z6841 Body Mass Index (BMI) 40.0 and over, adult: Secondary | ICD-10-CM | POA: Diagnosis not present

## 2020-03-07 DIAGNOSIS — Z79899 Other long term (current) drug therapy: Secondary | ICD-10-CM | POA: Diagnosis not present

## 2020-03-07 LAB — CBC WITH DIFFERENTIAL/PLATELET
Basophils Absolute: 0.1 10*3/uL (ref 0.0–0.1)
Basophils Relative: 1.1 % (ref 0.0–3.0)
Eosinophils Absolute: 0 10*3/uL (ref 0.0–0.7)
Eosinophils Relative: 0.5 % (ref 0.0–5.0)
HCT: 41.6 % (ref 39.0–52.0)
Hemoglobin: 13.2 g/dL (ref 13.0–17.0)
Lymphocytes Relative: 14.7 % (ref 12.0–46.0)
Lymphs Abs: 1.3 10*3/uL (ref 0.7–4.0)
MCHC: 31.7 g/dL (ref 30.0–36.0)
MCV: 74.7 fl — ABNORMAL LOW (ref 78.0–100.0)
Monocytes Absolute: 0.6 10*3/uL (ref 0.1–1.0)
Monocytes Relative: 6.4 % (ref 3.0–12.0)
Neutro Abs: 6.8 10*3/uL (ref 1.4–7.7)
Neutrophils Relative %: 77.3 % — ABNORMAL HIGH (ref 43.0–77.0)
Platelets: 208 10*3/uL (ref 150.0–400.0)
RBC: 5.58 Mil/uL (ref 4.22–5.81)
RDW: 16.9 % — ABNORMAL HIGH (ref 11.5–15.5)
WBC: 8.7 10*3/uL (ref 4.0–10.5)

## 2020-03-07 LAB — URIC ACID: Uric Acid, Serum: 9.7 mg/dL — ABNORMAL HIGH (ref 4.0–7.8)

## 2020-03-07 LAB — BASIC METABOLIC PANEL WITH GFR
Anion gap: 11 (ref 5–15)
BUN: 58 mg/dL — ABNORMAL HIGH (ref 6–20)
CO2: 32 mmol/L (ref 22–32)
Calcium: 9.4 mg/dL (ref 8.9–10.3)
Chloride: 97 mmol/L — ABNORMAL LOW (ref 98–111)
Creatinine, Ser: 1.68 mg/dL — ABNORMAL HIGH (ref 0.61–1.24)
GFR, Estimated: 46 mL/min — ABNORMAL LOW
Glucose, Bld: 193 mg/dL — ABNORMAL HIGH (ref 70–99)
Potassium: 3.5 mmol/L (ref 3.5–5.1)
Sodium: 140 mmol/L (ref 135–145)

## 2020-03-07 MED ORDER — DAPAGLIFLOZIN PROPANEDIOL 10 MG PO TABS
10.0000 mg | ORAL_TABLET | Freq: Every day | ORAL | 6 refills | Status: DC
Start: 2020-03-07 — End: 2020-03-07

## 2020-03-07 MED ORDER — DICLOFENAC SODIUM 1 % EX GEL: 2.0000 g | Freq: Three times a day (TID) | CUTANEOUS | 0 refills | Status: DC | PRN

## 2020-03-07 MED ORDER — DAPAGLIFLOZIN PROPANEDIOL 10 MG PO TABS: 10.0000 mg | ORAL_TABLET | Freq: Every day | ORAL | 6 refills | Status: DC

## 2020-03-07 MED ORDER — METOLAZONE 5 MG PO TABS
5.0000 mg | ORAL_TABLET | ORAL | Status: DC
Start: 2020-03-09 — End: 2020-07-22

## 2020-03-07 NOTE — Assessment & Plan Note (Signed)
Acute for the last 4-6 days, doesn't seem to represent gout, especially given lack of improvement with prednisone and colchicine.   Suspect arthritis. Need to rule out septic joint.  Checking CBC with diff and uric acid levels today. Plain films of the left knee pending.  Discussed routine use of Tylenol for pain.  Rx for Voltaren Gel sent to pharmacy.  Discussed the importance of stretching, avoid remaining sedentary.   Await results.

## 2020-03-07 NOTE — Patient Instructions (Signed)
Complete xray(s) and labs prior to leaving today. I will notify you of your results once received.  You can apply the diclofenac (Voltaren Gel) three times daily as needed for pain.  Take 1000 mg of Tylenol every 8 hours for pain.   Try to stretch the knee when possible. Refrain from sitting too still.  Consider using a knee sleeve for support.   It was a pleasure meeting you!

## 2020-03-07 NOTE — Progress Notes (Signed)
Subjective:    Patient ID: [name]., male    DOB: 1961/01/11, 58 y.o.   MRN: [id number]  HPI  This visit occurred during the SARS-CoV-2 public health emergency.  Safety protocols were in place, including screening questions prior to the visit, additional usage of staff PPE, and extensive cleaning of exam room while observing appropriate contact time as indicated for disinfecting solutions.   [name] is a 59 year old male patient of Dr. Silvio Pate with a history of CHF, atrial fibrillation, pulmonary hypertension, venous embolism, type 2 diabetes, ESRD, renal transplant 12 years ago, gout who presents today with a chief complaint of knee pain.  Currently managed on Uloric 40 mg daily for gout prevention, is also managed on colchicine 0.6 mg PRN for flares. His pain is located to the left anterior knee which began four days ago while resting. Four days ago he began to take his colchicine 0.6 mg but this has not helped to improve pain. He feels as though he may have fluid behind the knee. His pain today is different to his typical gout pain.  He denies warmth, redness, significant swelling, injury/trauma, no recent overdue. He's been taking Tylenol "3 pills" once daily without much improvement. He's also used [employer] without much improvement.  He is following with rheumatology through [facility] for gout treatment. Uric acid level of 10.3 in November 2021, improved from 12.0 in July 2021. According to chart review he phoned into his rheumatologist's office on 03/01/20 requesting prednisone due to stiffness and inability to move his knee, a prescription for prednisone was sent to pharmacy, he has one dose remaining today.  Review of Systems  Constitutional: Negative for fever.  Musculoskeletal: Positive for arthralgias.  Skin: Negative for color change and wound.       Past Medical History:  Diagnosis Date   Allergy    seasonal   Amnestic MCI (mild cognitive  impairment with memory loss)    Asthma    Benign neoplasm of colon    CAD (coronary artery disease) 2005   a.  Status post four-vessel CABG in 2005; b. 03/2018 MV: Small, fixed inferoapical and apical septal defect. No ischemia. EF 47% (60-65% by 05/2018 Echo).   Chronic atrial fibrillation ([place])    a. CHADS2VASc 4 (CHF, HTN, DM, vascular disease)--> Eliquis; b. 09/2018 Zio: Chronic Afib.   Chronic combined systolic and diastolic CHF (congestive heart failure) ([place])    a.  7/18 Echo: EF 45-50%; b. 05/2018 Echo: EF 60-65%, RVSP 74.8; c. 12/2018 Echo: EF 50-55%. Sev dil LA.   Chronic venous stasis dermatitis of both lower extremities    Cytomegaloviral disease ([place]) 2017   Depression    Diabetes mellitus    Diverticulosis    Dyspnea    Erectile dysfunction    ESRD (end stage renal disease) ([place]) 2005   a. HD 2004-2012; b. S/p renal transplant in 2012   GERD (gastroesophageal reflux disease)    Gout    Hyperlipidemia    low since renal failure   Hypertension    Kidney transplant status, cadaveric 2012   Obesity    PAH (pulmonary artery hypertension) ([place])    a. 05/2018 Echo: RVSP 74.29mHg; b. 12/2018 RV not well visualized.   Pneumonia    Purpura ([place]    Sleep apnea    bipap with oxygen 2 l   Trigger finger    Ulcer 05/2016   Left shin   Wears glasses  Social History   Socioeconomic History   Marital status: Married    Spouse name: Not on file   Number of children: 2   Years of education: Not on file   Highest education level: Not on file  Occupational History   Occupation: Warden/ranger: unemployed    Comment: disabled due to kidney failure  Tobacco Use   Smoking status: Former Smoker    Types: Cigars    Quit date: 09/02/1994    Years since quitting: 25.5   Smokeless tobacco: Never Used  Vaping Use   Vaping Use: Never used  Substance and Sexual Activity   Alcohol use: Yes    Alcohol/week: 0.0 - 1.0  standard drinks    Comment: occasional- 3 in the past year   Drug use: No   Sexual activity: Not on file  Other Topics Concern   Not on file  Social History Narrative   In school for culinary arts      Has living will   Wife is health care POA   Would accept resuscitation attempts   Hasn't considered tube feedings   Social Determinants of Health   Financial Resource Strain: Not on file  Food Insecurity: Not on file  Transportation Needs: Not on file  Physical Activity: Not on file  Stress: Not on file  Social Connections: Not on file  Intimate Partner Violence: Not on file    Past Surgical History:  Procedure Laterality Date   BARIATRIC [place]  05/03/2019   CATARACT EXTRACTION W/PHACO Right 10/29/2017   Procedure: CATARACT EXTRACTION PHACO AND INTRAOCULAR LENS PLACEMENT ([place])  RIGHT DIABETIC;  Surgeon: [name], MD;  Location: [place];  Service: Ophthalmology;  Laterality: Right;  Diabetic - insulin   CATARACT EXTRACTION W/PHACO Left 11/18/2017   Procedure: CATARACT EXTRACTION PHACO AND INTRAOCULAR LENS PLACEMENT ([place]) LEFT IVA/TOPICAL;  Surgeon: [name], MD;  Location: [place];  Service: Ophthalmology;  Laterality: Left;  DIABETES - insulin sleep apnea   COLONOSCOPY WITH PROPOFOL N/A 04/22/2013   Procedure: COLONOSCOPY WITH PROPOFOL;  Surgeon: [name], MD;  [facility];  Service: Endoscopy;  Laterality: N/A;   CORONARY ARTERY BYPASS GRAFT  2005   X 4   DG ANGIO AV SHUNT*L*     right and left upper arms   FASCIOTOMY  03/03/2012   Procedure: FASCIOTOMY;  Surgeon: [name], MD;  Location: [place];  Service: Orthopedics;  Laterality: Right;  FASCIOTOMY RIGHT SMALL FINGER   FASCIOTOMY Left 08/17/2013   Procedure: FASCIOTOMY LEFT RING;  Surgeon: [name], MD;  Location: [place];  Service:  Orthopedics;  Laterality: Left;   INCISION AND DRAINAGE ABSCESS Left 10/15/2015   Procedure: INCISION AND DRAINAGE ABSCESS;  Surgeon: [name], MD;  Location: [facility];  Service: General;  Laterality: Left;   KIDNEY TRANSPLANT  09/13/2010   cadaver--at [facility] HEART CATH N/A 11/15/2016   Procedure: RIGHT HEART CATH;  Surgeon: [name], MD;  Location: [place] [facility];  Service: Cardiovascular;  Laterality: N/A;   RIGHT HEART CATH N/A 05/03/2019   Procedure: RIGHT HEART CATH;  Surgeon: [name], MD;  Location: [place] [facility];  Service: Cardiovascular;  Laterality: N/A;   TYMPANIC MEMBRANE REPAIR  1/12   left   VASECTOMY      Family History  Problem Relation Age of Onset   Heart disease Father    Kidney failure Father    Kidney disease Father    Diabetes Maternal Grandmother    Breast cancer Maternal Grandmother    Valvular heart disease Mother    Liver cancer Paternal Uncle    Liver cancer Paternal Grandmother    Prostate cancer Neg Hx     Allergies  Allergen Reactions   Contrast Media [Iodinated Diagnostic Agents]     Kidney transplant   Ibuprofen Other (See Comments)    Due to kidney transplant Due to kidney transplant Due to kidney transplant    Current Outpatient Medications on File Prior to Visit  Medication Sig Dispense Refill   beclomethasone (QVAR) 80 MCG/ACT inhaler Inhale 2 puffs into the lungs 2 (two) times daily.      buPROPion (WELLBUTRIN SR) 150 MG 12 hr tablet Take 150 mg by mouth 2 (two) times daily.     colchicine 0.6 MG tablet Take by mouth.     ELIQUIS 5 MG TABS tablet TAKE 1 TABLET BY MOUTH TWICE DAILY 180 tablet 1   febuxostat (ULORIC) 40 MG tablet Take 40 mg by mouth daily.     FERREX 150 150 MG capsule Take 150 mg by mouth daily.      fluticasone (FLONASE) 50 MCG/ACT nasal spray PLACE 2 SPRAYS INTO BOTH NOSTRILS DAILY. 16 g 11   gabapentin (NEURONTIN) 300 MG capsule Take by mouth.      HUMALOG KWIKPEN 100 UNIT/ML KwikPen Inject 5-10 Units into the skin 3 (three) times daily. Sliding scale     insulin glargine (LANTUS) 100 unit/mL SOPN Inject 40 Units into the skin at bedtime.      metolazone (ZAROXOLYN) 5 MG tablet Take 1 tablet (5 mg total) by mouth once a week. Every Tuesday 10 tablet 5   mometasone (NASONEX) 50 MCG/ACT nasal spray Place into the nose.     montelukast (SINGULAIR) 10 MG tablet TAKE 1 TABLET BY MOUTH AT BEDTIME. 90 tablet 3   Multiple Vitamin (MULTIVITAMIN WITH MINERALS) TABS tablet Take 1 tablet by mouth daily.     mycophenolate (MYFORTIC) 180 MG EC tablet Take 360 mg by mouth 2 (two) times daily.     omeprazole (PRILOSEC) 20 MG capsule Take 1 capsule (20 mg total) by mouth daily. 90 capsule 3   potassium chloride (MICRO-K) 10 MEQ CR capsule Take 4 capsules (40 mEq total) by mouth 2 (two) times daily. Take an extra tab on Tue and Fri with metolazone 240 capsule 4   rosuvastatin (CRESTOR) 10 MG tablet TAKE 1 TABLET BY MOUTH DAILY 90 tablet 2   tacrolimus (PROGRAF) 1 MG capsule Take by mouth 2 (two) times daily. 3 mg in AM and 38m in PM     tadalafil, PAH, (ADCIRCA) 20 MG tablet Take 1 tablet (20 mg total) by mouth every other day. 60 tablet 1   tamsulosin (FLOMAX) 0.4 MG CAPS capsule Take 0.4 mg by mouth daily.      torsemide (DEMADEX) 20 MG tablet Take 4 tablets (80 mg total) by mouth 2 (two) times daily. 240 tablet 3   vitamin C (ASCORBIC ACID) 250 MG tablet Take 1 tablet (250 mg total) by mouth daily. 30 tablet 0   zinc sulfate 220 (50 Zn) MG capsule Take 1 capsule (220 mg total) by mouth 2 (two) times daily. 60 capsule 0   amLODipine (NORVASC) 5 MG tablet Take 1 tablet (5 mg total) by mouth daily. (Patient not taking:  Reported on 03/07/2020) 90 tablet 2   No current facility-administered medications on file prior to visit.    BP 132/74    Pulse 76    Temp 97.6 F (36.4 C) (Temporal)    Ht _0  (1.727 m)    Wt 269 lb (122 kg)    SpO2 98%     BMI 40.90 kg/m    Objective:   Physical Exam Musculoskeletal:     Left knee: Swelling present. No bony tenderness. Decreased range of motion. Tenderness present.     Comments: Very slight swelling to proximal anterior knee. Tenderness to distal portion of left knee. Limited ROM due to pain, especially with extension. 5/5 strength bilaterally.   Skin:    General: Skin is warm and dry.     Findings: No erythema.     Comments: No warmth.   Neurological:     Mental Status: He is alert.  Psychiatric:        Mood and Affect: Mood normal.            Assessment & Plan:

## 2020-03-07 NOTE — Patient Instructions (Addendum)
Restart Farxiga 10 mg Daily  Change Metolazone to 5 mg every Tuesday and Sunday  Steffanie Dunn will administer IV Lasix on:  Thursday 03/09/20  Wednesday 03/15/20  Thursday 03/23/20  **ON THESE DAYS DO NOT TAKE AM DOSE OF TORSEMIDE**  Labs done today, your results will be available in MyChart, we will contact you for abnormal readings.  Your physician recommends that you return for lab work in: 2 weeks  Your physician recommends that you schedule a follow-up appointment in: 3-4 weeks  If you have any questions or concerns before your next appointment please send [name] a message through [place] or call our office at [phone].    TO LEAVE A MESSAGE FOR THE NURSE SELECT OPTION 2, PLEASE LEAVE A MESSAGE INCLUDING:  YOUR NAME  DATE OF BIRTH  CALL BACK NUMBER  REASON FOR CALL**this is important as we prioritize the call backs  YOU WILL RECEIVE A CALL BACK THE SAME DAY AS LONG AS YOU CALL BEFORE 4:00 PM  At the [facility], you and your health needs are our priority. As part of our continuing mission to provide you with exceptional heart care, we have created designated Provider Care Teams. These Care Teams include your primary Cardiologist (physician) and Advanced Practice Providers (APPs- Physician Assistants and Nurse Practitioners) who all work together to provide you with the care you need, when you need it.   You may see any of the following providers on your designated Care Team at your next follow up:  Dr Glori Bickers  Dr Haynes Kerns, NP  Lyda Jester, [place]  Audry Riles, PharmD   Please be sure to bring in all your medications bottles to every appointment.

## 2020-03-07 NOTE — Progress Notes (Signed)
V.O. for IV Lasix given to Kristi and emailed to her as well, she is aware, agreeable, and verbalized understanding

## 2020-03-08 NOTE — Progress Notes (Signed)
Heart Failure Note   Date:  03/08/2020   ID:  [name]., DOB [birth year], MRN [id number]   Provider location: [address], [place] Type of Visit:  Established patient  PCP:  [name], MD  Cardiologist:  [name] Primary HF: [name]   History of Present Illness: [name]. is a 59 y.o. male  with a history of CAD s/p CABG 2005, chronic atrial fibrillation on Eliquis, ESRD s/p renal transplant 2012, anc chronic diastolic CHF.  His renal function has been fairly stable, most recent creatinine 1.43.  He has been struggling with volume overload/CHF.  He has a history of ischemic cardiomyopathy with EF as low as 35-40% in the past but last echo in 3/20 showed EF 60-65%.  There is evidence for RV failure with pulmonary hypertension by echo.    Admitted 11/05/18 low grade fever and shortness of breath. HS Trop negative. Covid 19 negative. Blood CX negative. Placed on antibiotics and discharged to home the next day.   Admitted 10/25/13 with A/C Diastolic HF. Diuresed with IV lasix and transitioned to torsemide 80/40 mg . Discharge weight 252 pounds.   Admitted 06/2019 with left arm pain. Left upper extremity venous Doppler revealed occluded cephalic vein at the AV fistula outflow tract in the antecubital fossa with no other thrombus identified. Vascular evaluated. He continued on eliquis.  No plan for intervention. Also treated for acute gout flare. Discharged on prednisone taper.    At last appointment, BP-active meds cut back due to orthostatic hypotension and worsening renal function.   Patient returns for followup of CHF.  His volume overload has been difficult to manage, complicated with CKD stage 3.  Weight today is up 1 lb.  Peripheral edema is improved.  He is breathing better compared to last appointment, but still short of breath with inclines and stairs. No dyspnea walking on flat ground.  No orthopnea/PND.  No chest pain.  No  lightheadedness/orthostatic symptoms since cutting back BP-active meds.  He is back on prednisone 10 mg daily for gout flare. He also takes allopurinol and colchicine.   REDS clip 40%.   Labs (7/19): LDL 49 Labs (2/20): K 3.7, creatinine 1.43 Labs (4/20): K 4.3, creatinine 1.24 Labs (11/06/18): K 3.7 creatinine 1.23  Labs (05/07/19): K 3.8 Creatinine 1.5  Labs (07/18/19): K 4.3 Creatinine 1.42  Labs (5/21): K 3.3, creatinine 1.21 Labs (11/21): K 3.6, creatinine 1.4 Labs (12/21): K 3.3, creatinine 2.4, hgb 12.8  PMH: 1. CAD: s/p CABG x 4 in [place] in 2005.  - Cardiolite (1/20): EF 47%, small fixed apical septal defect, no ischemia.  Low risk study.  2. Asthma 3. Gout 4. Atrial fibrillation: Chronic.  5. ESRD s/p renal transplantation at [facility] in 2012.  6. Anemia of chronic disease 7. Type II diabetes 8. HTN 9. Hyperlipidemia 10. OSA: On bipap.  11. Ischemic cardiomyopathy: EF 35-40% in past.  - Cardiolite (1/20) with EF 47%.  - Echo (1/20): EF 50-55%, mild MR, RV moderately dilated with moderately decreased systolic function, PASP 73 mmHg, moderate TR.   - Echo (3/20): EF 60-65%, PASP 75 mmHg, mildly dilated RV with mildly decreased systolic function.  - Echo (2/21): EF 45-50%, global HK, moderately dilated RV with moderately decreased systolic function, D-shaped interventricular septum, dilated IVC.  - RHC (2/21): mean RA 19, PA 78/31 mean 51, mean PCWP 26, CI 3.26, PVR 3.25 12. Obesity: s/p gastric bypass in 2015.  13. Bradycardia with beta blocker.   Past Surgical History:  Procedure Laterality Date   BARIATRIC SURGERY     CARDIAC CATHETERIZATION     [facility]   CARDIAC CATHETERIZATION  05/03/2019   CATARACT EXTRACTION W/PHACO Right 10/29/2017   Procedure: CATARACT EXTRACTION PHACO AND INTRAOCULAR LENS PLACEMENT ([place])  RIGHT DIABETIC;  Surgeon: [name], MD;  Location: [place];  Service: Ophthalmology;  Laterality: Right;  Diabetic - insulin    CATARACT EXTRACTION W/PHACO Left 11/18/2017   Procedure: CATARACT EXTRACTION PHACO AND INTRAOCULAR LENS PLACEMENT ([place]) LEFT IVA/TOPICAL;  Surgeon: [name], MD;  Location: [place];  Service: Ophthalmology;  Laterality: Left;  DIABETES - insulin sleep apnea   COLONOSCOPY WITH PROPOFOL N/A 04/22/2013   Procedure: COLONOSCOPY WITH PROPOFOL;  Surgeon: [name], MD;  [facility];  Service: Endoscopy;  Laterality: N/A;   CORONARY ARTERY BYPASS GRAFT  2005   X 4   DG ANGIO AV SHUNT*L*     right and left upper arms   FASCIOTOMY  03/03/2012   Procedure: FASCIOTOMY;  Surgeon: [name], MD;  Location: [place];  Service: Orthopedics;  Laterality: Right;  FASCIOTOMY RIGHT SMALL FINGER   FASCIOTOMY Left 08/17/2013   Procedure: FASCIOTOMY LEFT RING;  Surgeon: [name], MD;  Location: [place];  Service: Orthopedics;  Laterality: Left;   INCISION AND DRAINAGE ABSCESS Left 10/15/2015   Procedure: INCISION AND DRAINAGE ABSCESS;  Surgeon: [name], MD;  Location: [facility];  Service: General;  Laterality: Left;   KIDNEY TRANSPLANT  09/13/2010   cadaver--at [facility] HEART CATH N/A 11/15/2016   Procedure: RIGHT HEART CATH;  Surgeon: [name], MD;  Location: [place] [facility];  Service: Cardiovascular;  Laterality: N/A;   RIGHT HEART CATH N/A 05/03/2019   Procedure: RIGHT HEART CATH;  Surgeon: [name], MD;  Location: [place] [facility];  Service: Cardiovascular;  Laterality: N/A;   TYMPANIC MEMBRANE REPAIR  1/12   left   VASECTOMY       Current Outpatient Medications  Medication Sig Dispense Refill   beclomethasone (QVAR) 80 MCG/ACT inhaler Inhale 2 puffs into the lungs 2 (two) times daily.      buPROPion (WELLBUTRIN SR) 150 MG 12 hr tablet Take 150 mg by mouth 2 (two) times daily.     colchicine 0.6 MG tablet Take 0.6 mg by mouth daily as needed.     diclofenac Sodium (VOLTAREN) 1 %  GEL Apply 2 g topically 3 (three) times daily as needed. 100 g 0   ELIQUIS 5 MG TABS tablet TAKE 1 TABLET BY MOUTH TWICE DAILY 180 tablet 1   febuxostat (ULORIC) 40 MG tablet Take 40 mg by mouth daily.     FERREX 150 150 MG capsule Take 150 mg by mouth daily.      fluticasone (FLONASE) 50 MCG/ACT nasal spray PLACE 2 SPRAYS INTO BOTH NOSTRILS DAILY. 16 g 11   gabapentin (NEURONTIN) 300 MG capsule Take 300 mg by mouth 2 (two) times daily.     HUMALOG KWIKPEN 100 UNIT/ML KwikPen Inject 5-10 Units into the skin 3 (three) times daily. Sliding scale     insulin glargine (LANTUS) 100 unit/mL SOPN Inject 40 Units into the skin at bedtime.      mometasone (NASONEX) 50 MCG/ACT nasal spray Place 2 sprays into the nose daily as needed.     montelukast (SINGULAIR) 10 MG tablet TAKE 1 TABLET BY MOUTH  AT BEDTIME. 90 tablet 3   Multiple Vitamin (MULTIVITAMIN WITH MINERALS) TABS tablet Take 1 tablet by mouth daily.     mycophenolate (MYFORTIC) 180 MG EC tablet Take 360 mg by mouth 2 (two) times daily.     omeprazole (PRILOSEC) 20 MG capsule Take 1 capsule (20 mg total) by mouth daily. 90 capsule 3   potassium chloride (MICRO-K) 10 MEQ CR capsule Take 4 capsules (40 mEq total) by mouth 2 (two) times daily. Take an extra tab on Tue and Fri with metolazone 240 capsule 4   predniSONE (DELTASONE) 10 MG tablet Take by mouth. Dose pack     rosuvastatin (CRESTOR) 10 MG tablet TAKE 1 TABLET BY MOUTH DAILY 90 tablet 2   tacrolimus (PROGRAF) 1 MG capsule 3 mg in AM and 64m in PM     tadalafil, PAH, (ADCIRCA) 20 MG tablet Take 1 tablet (20 mg total) by mouth every other day. 60 tablet 1   tamsulosin (FLOMAX) 0.4 MG CAPS capsule Take 0.4 mg by mouth daily.      torsemide (DEMADEX) 20 MG tablet Take 100 mg by mouth 2 (two) times daily.     vitamin C (ASCORBIC ACID) 250 MG tablet Take 1 tablet (250 mg total) by mouth daily. 30 tablet 0   zinc sulfate 220 (50 Zn) MG capsule Take 1 capsule (220 mg total) by  mouth 2 (two) times daily. 60 capsule 0   dapagliflozin propanediol (FARXIGA) 10 MG TABS tablet Take 1 tablet (10 mg total) by mouth daily before breakfast. 30 tablet 6   [START ON 03/09/2020] metolazone (ZAROXOLYN) 5 MG tablet Take 1 tablet (5 mg total) by mouth 2 (two) times a week. On tuesdays Sundays     No current facility-administered medications for this encounter.    Allergies:   Contrast media [iodinated diagnostic agents] and Ibuprofen   Social History:  The patient  reports that he quit smoking about 25 years ago. His smoking use included cigars. He has never used smokeless tobacco. He reports current alcohol use. He reports that he does not use drugs.   Family History:  The patient's family history includes Breast cancer in his maternal grandmother; Diabetes in his maternal grandmother; Heart disease in his father; Kidney disease in his father; Kidney failure in his father; Liver cancer in his paternal grandmother and paternal uncle; Valvular heart disease in his mother.   ROS:  Please see the history of present illness.   All other systems are personally reviewed and negative.  Vitals:   03/07/20 1126  BP: 138/80  Pulse: 69  SpO2: 94%   Wt Readings from Last 3 Encounters:  03/07/20 121.1 kg (267 lb)  03/07/20 122 kg (269 lb)  02/23/20 120.9 kg (266 lb 9.6 oz)   Exam:   General: NAD Neck: JVP 8-9 cm, no thyromegaly or thyroid nodule.  Lungs: Clear to auscultation bilaterally with normal respiratory effort. CV: Nondisplaced PMI.  Heart regular S1/S2, no S3/S4, no murmur.  Trace ankle edema.  No carotid bruit.  Normal pedal pulses.  Abdomen: Soft, nontender, no hepatosplenomegaly, no distention.  Skin: Intact without lesions or rashes.  Neurologic: Alert and oriented x 3.  Psych: Normal affect. Extremities: No clubbing or cyanosis.  HEENT: Normal.   Recent Labs: 05/03/2019: TSH 1.615 06/01/2019: B Natriuretic Peptide 246.0 08/04/2019: ALT 19; Magnesium 1.6 03/07/2020:  BUN 58; Creatinine, Ser 1.68; Hemoglobin 13.2; Platelets 208.0; Potassium 3.5; Sodium 140  Personally reviewed   Wt Readings from Last 3 Encounters:  03/07/20 121.1 kg (267 lb)  03/07/20 122 kg (269 lb)  02/23/20 120.9 kg (266 lb 9.6 oz)    ASSESSMENT AND PLAN:  1. Heart failure with mid range EF: With prominent RV failure/pulmonary hypertension. ?If RV failure is related to severe OSA, he was a remote smoker.  Echo in 2/21 with EF 45-50%, moderately dilated RV with moderately decreased systolic function, moderate pulmonary hypertension. NYHA class II-III symptoms.  Mild residual volume overload by exam and by REDs clip reading 40%.   - BMET today.  - Continue torsemide 100 mg bid.  - Continue metolazone twice a week on Tuesday and Sunday.  - For the next 3 wks, I will have him take a dose of Lasix 80 mg IV (from paramedicine) to replace his am torsemide on Thursdays.  Followup with APP in 3 wks and can decide at that time whether he needs to continue the IV Lasix.  - He will stay off lisinopril and spironolactone with rise in creatinine.  - He can restart dapagliflozin 10 mg daily.  - Unable to confirm that his insurance will cover Cardiomems so will manage him without this.   2. Atrial fibrillation: Chronic. Rate controlled.   - He will continue Eliquis.  - Not on beta blocker with history of bradycardia.   3. CAD: S/p CABG 2005.  Low risk Cardiolite in 1/20.   - No ASA given Eliquis use.  - Continue Crestor 10 mg daily, check lipids at next appt.  4. CKD: Stage 3.  S/p renal transplantation in 2012. He remains on mycophenolate and tacrolimus.   - BMET today.  5. OSA: Continue Bipap every night.  6. HTN: BP ok off lisinopril and spironolactone.  7. Pulmonary hypertension: Severe pulmonary hypertension by 3/20 echo.  RHC in 2/21 with moderate, primarily pulmonary venous hypertension (PVR 3.25 WU). Suspect that there is also a component of group 3 PH (OSA, ?unrecognized OHS).  8. Gout:  Would try to use colchicine instead of prednisone as much as possible, think prednisone contributes to volume overload.     Followup with APP in 3 wks.    Signed, Loralie Champagne, MD  03/08/2020  [facility] [address] Heart and [place] [address] [phone] (office) [phone] (fax)

## 2020-03-09 ENCOUNTER — Encounter (HOSPITAL_COMMUNITY): Payer: Self-pay

## 2020-03-09 ENCOUNTER — Other Ambulatory Visit (HOSPITAL_COMMUNITY): Payer: Self-pay

## 2020-03-09 NOTE — Progress Notes (Signed)
Today had a home visit with Lux.  Had an order to give him 80 mg IV lasix with 10 cc flush.  He has skipped his morning dose of torsemide.  Iv established and lasix was given, flushed easy and had no infiltration noted.  Held pressure for a few minutes after taking saline lock out.  Bleeding was controlled prior to leaving.  Bandage placed.  He denies any problems today such as chest pain, increased shortness of breath.  He complains of his knee hurting and has not heard from his doctor with results of xrays.  His wife has advised him to go to ED if no better today.  Did suggest the [facility] urgent care to him.  It would be quicker and less germs than ED.  He said he would contact his doctor and has no results will try [facility].  He has all his medications and aware of what and how to take them.  He wanted a med box to place his meds in, brought him a box.  He states he knows how to place them.  Advised him to contact me or HF clinic if any problems.  Will visit again next week on Wednesday for another dose of lasix.    [place] [phone]

## 2020-03-15 ENCOUNTER — Other Ambulatory Visit (HOSPITAL_COMMUNITY): Payer: Self-pay

## 2020-03-15 ENCOUNTER — Encounter (HOSPITAL_COMMUNITY): Payer: Self-pay

## 2020-03-15 NOTE — Progress Notes (Signed)
Today had a home visit with Levonte to give another dose of lasix.  He states he only urinated twice after the lasix given last week.  He said his lowest weight was last Friday at 256 lbs.  He was 259 lbs last week prior to lasix, today is 258 lbs.  He states breathing may be a little better.  Extremities has no swelling.  He has been relaxing and resting his knee since it was bothering him.  He states feeling better.  He has been elevating his legs when resting.  He denies any chest pain, headaches, or dizziness.  He states been drinking around 90 ounces of fluid a day or a little more.  Advised him to cut his fluids back to 60 ounces and lets see if that helps.  He states drinks fluids to stay full instead of eating. He cooks his food at home and does not add salt, uses Mrs Deliah Boston for seasoning.  He has all his medications and aware of to take them.  He skipped his morning torsemide this morning due to giving him lasix.  Administered 80 mg IV lasix with 10 cc saline after.  No edema noted afterwards and bleeding controlled.  Placed bandage on it.  Advised will be back next Thursday for 3rd dose of lasix.  Advised him any problems to call me or who is on call for HF clinic in [place].  During the holiday if any Emergency to go to ED.  Will continue to visit for heart failure, medication compliance and diet.   [place] [phone]

## 2020-03-23 ENCOUNTER — Encounter (HOSPITAL_COMMUNITY): Payer: Self-pay

## 2020-03-23 ENCOUNTER — Other Ambulatory Visit (HOSPITAL_COMMUNITY): Payer: Self-pay

## 2020-03-23 NOTE — Progress Notes (Signed)
Today had a home visit with [name] to give 80 mg lasix IV today.  He states he ate too much Sunday after Christmas with his family.  He states weight is up 4 lbs since Monday.  His lowest weight was 256 lbs and today he is 260 lbs.  He states he can feel a little in his breathing but not much.  Legs are not swollen, lungs are clear.  He states will watch fluids and diet today.  He is going for blood work today at [facility].  He did not take his morning dose of torsemide due to me giving him the lasix today.  Gave 80 mg lasix IV with no edema noted.  Bleeding controlled with pressure and bandage.  He is aware of appt with HF clinic on the 10th. He has all his medications and aware of how to take them.  He did cut back on his fluids daily like we discussed last visit.  He states been urinating good and no discoloration or burning.  He denies any dizziness, headaches or chest pain.  Will continue to visit for heart failure.   [place] [phone]

## 2020-03-27 ENCOUNTER — Other Ambulatory Visit
Admission: RE | Admit: 2020-03-27 | Discharge: 2020-03-27 | Disposition: A | Discharge status: Home / Self Care | Payer: 59 | Attending: Internal Medicine | Admitting: Internal Medicine

## 2020-03-27 DIAGNOSIS — G4733 Obstructive sleep apnea (adult) (pediatric): Secondary | ICD-10-CM | POA: Diagnosis not present

## 2020-03-27 DIAGNOSIS — I5032 Chronic diastolic (congestive) heart failure: Secondary | ICD-10-CM | POA: Insufficient documentation

## 2020-03-27 DIAGNOSIS — E662 Morbid (severe) obesity with alveolar hypoventilation: Secondary | ICD-10-CM | POA: Diagnosis not present

## 2020-03-27 LAB — BASIC METABOLIC PANEL
Anion gap: 16 — ABNORMAL HIGH (ref 5–15)
BUN: 68 mg/dL — ABNORMAL HIGH (ref 6–20)
CO2: 31 mmol/L (ref 22–32)
Calcium: 9.1 mg/dL (ref 8.9–10.3)
Chloride: 91 mmol/L — ABNORMAL LOW (ref 98–111)
Creatinine, Ser: 2.2 mg/dL — ABNORMAL HIGH (ref 0.61–1.24)
GFR, Estimated: 33 mL/min — ABNORMAL LOW (ref >60)
Glucose, Bld: 216 mg/dL — ABNORMAL HIGH (ref 70–99)
Potassium: 3.4 mmol/L — ABNORMAL LOW (ref 3.5–5.1)
Sodium: 138 mmol/L (ref 135–145)

## 2020-03-29 ENCOUNTER — Telehealth (HOSPITAL_COMMUNITY): Payer: Self-pay

## 2020-03-29 NOTE — Telephone Encounter (Signed)
Called to set a time for a home visit tomorrow.  No answer left message, will call him again tomorrow.   [place] [phone]

## 2020-03-30 ENCOUNTER — Encounter (HOSPITAL_COMMUNITY): Payer: Self-pay

## 2020-03-30 ENCOUNTER — Other Ambulatory Visit (HOSPITAL_COMMUNITY): Payer: Self-pay

## 2020-03-30 ENCOUNTER — Telehealth (HOSPITAL_COMMUNITY): Payer: Self-pay | Admitting: *Deleted

## 2020-03-30 DIAGNOSIS — I5032 Chronic diastolic (congestive) heart failure: Secondary | ICD-10-CM

## 2020-03-30 DIAGNOSIS — E1165 Type 2 diabetes mellitus with hyperglycemia: Secondary | ICD-10-CM | POA: Diagnosis not present

## 2020-03-30 NOTE — Progress Notes (Signed)
Had a home visit with [name] today to check on him per [facility] due to his blood work.  When arrived he is having chest pressure and pain.  He states just feels bad, short of breath with activity and having to use more pillows at night to sleep starting 3 nights ago.  His lungs are clear.  He states feels full all the time.  He says its so depressing.  Contacted HF clinic and advised Elsie Lincoln spoke to [facility] and he advised to give him 80 mg lasix today and if symptoms does not improve to go to ED.  Advised him, he has dentist appt this morning and will go back at 1:30 to give the lasix.  He has no swelling in extremities.  He states watching what he eats and drinks.  He is trying to stay active but rest also and elevate.  He has had family members move in this past weekend with him, he states not too stressfull.  He enjoys cooking and talked about showing his daughter in law how to make cookies, he said he only ate 2 of them.  He eats at home most of the time, he cooks and uses Mrs dash.  He does not add salt to foods and uses fresh items to cook with.  Came back and gave 80 mg IV lasix at 1:30, no problems during the administration.  Bleeding controlled with bandaid.  He is aware if symptoms does not improve to go to ED this weekend, he is aware of his HF clinic appt on Monday.  He will be going to get blood work done tomorrow at [facility].  Will continue to visit for heart failure.   [place] [phone]

## 2020-03-30 NOTE — Telephone Encounter (Signed)
LATE ENTRY from 9:30 AM today:  Kristi, pt's community paramedic called to report his wt is up, he is having increased SOB, especially at night and is unable to lay flat, recent lab work showed elevated kidney function so no IV lasix was given this week.  Discussed all with Dr Aundra Dubin, he states pt should get IV Lasix today, bmet needed tomorrow, close f/u with [name] early next week, and close f/u with nephrology  Steffanie Dunn is aware and will administer IV Lasix today, pt will go to [facility] for labs tomorrow and he is sch for f/u with [name] on Mon 1/10, she will ensure he has nephrology f/u soon as well.

## 2020-03-31 ENCOUNTER — Other Ambulatory Visit: Payer: Self-pay

## 2020-03-31 ENCOUNTER — Other Ambulatory Visit
Admission: RE | Admit: 2020-03-31 | Discharge: 2020-03-31 | Disposition: A | Discharge status: Home / Self Care | Payer: 59 | Source: Ambulatory Visit | Attending: Cardiology | Admitting: Cardiology

## 2020-03-31 DIAGNOSIS — I5032 Chronic diastolic (congestive) heart failure: Secondary | ICD-10-CM | POA: Diagnosis not present

## 2020-03-31 LAB — BASIC METABOLIC PANEL
Anion gap: 12 (ref 5–15)
BUN: 72 mg/dL — ABNORMAL HIGH (ref 6–20)
CO2: 35 mmol/L — ABNORMAL HIGH (ref 22–32)
Calcium: 9.7 mg/dL (ref 8.9–10.3)
Chloride: 93 mmol/L — ABNORMAL LOW (ref 98–111)
Creatinine, Ser: 2.17 mg/dL — ABNORMAL HIGH (ref 0.61–1.24)
GFR, Estimated: 34 mL/min — ABNORMAL LOW (ref >60)
Glucose, Bld: 99 mg/dL (ref 70–99)
Potassium: 3.3 mmol/L — ABNORMAL LOW (ref 3.5–5.1)
Sodium: 140 mmol/L (ref 135–145)

## 2020-04-02 NOTE — Progress Notes (Signed)
Heart Failure Note   Date:  04/03/2020   ID:  Carl Beavers., DOB 1960/06/21, MRN [id number]   Provider location: [address] [place] Type of Visit:  Established patient  PCP:  [name], MD  Cardiologist:  [name] Primary HF: [name] Nephrology: Dr Ronnald Ramp    History of Present Illness: [name]. is a 60 y.o. male  with a history of CAD s/p CABG 2005, chronic atrial fibrillation on Eliquis, ESRD s/p renal transplant 2012, anc chronic diastolic CHF.  His renal function has been fairly stable, most recent creatinine 1.43.  He has been struggling with volume overload/CHF.  He has a history of ischemic cardiomyopathy with EF as low as 35-40% in the past but last echo in 3/20 showed EF 60-65%.  There is evidence for RV failure with pulmonary hypertension by echo.    Admitted 11/05/18 low grade fever and shortness of breath. HS Trop negative. Covid 19 negative. Blood CX negative. Placed on antibiotics and discharged to home the next day.   Admitted 05/01/49 with A/C Diastolic HF. Diuresed with IV lasix and transitioned to torsemide 80/40 mg . Discharge weight 252 pounds.   Admitted 06/2019 with left arm pain. Left upper extremity venous Doppler revealed occluded cephalic vein at the AV fistula outflow tract in the antecubital fossa with no other thrombus identified. Vascular evaluated. He continued on eliquis.  No plan for intervention. Also treated for acute gout flare. Discharged on prednisone taper.    At last appointment, BP-active meds cut back due to orthostatic hypotension and worsening renal function.   Given IV lasix on 12/6 by HF Paramedicine.   Today he returns for HF follow up.Overall feeling fair. Just finished working out 2 hours and says he has lots of tasks to complete at home today. Denies SOB/PND/Orthopnea. No bleeding issues. Using Bipap every night. Appetite ok. No fever or chills. Weight at home 260-264  pounds. Taking all  medications.  Reds Clip 38%   Labs (7/19): LDL 49 Labs (2/20): K 3.7, creatinine 1.43 Labs (4/20): K 4.3, creatinine 1.24 Labs (11/06/18): K 3.7 creatinine 1.23  Labs (05/07/19): K 3.8 Creatinine 1.5  Labs (07/18/19): K 4.3 Creatinine 1.42  Labs (5/21): K 3.3, creatinine 1.21 Labs (11/21): K 3.6, creatinine 1.4 Labs (12/21): K 3.3, creatinine 2.4, hgb 12.8 Labs (04/01/19): K 3.3 Creatinine 2.2   PMH: 1. CAD: s/p CABG x 4 in [place] in 2005.  - Cardiolite (1/20): EF 47%, small fixed apical septal defect, no ischemia.  Low risk study.  2. Asthma 3. Gout 4. Atrial fibrillation: Chronic.  5. ESRD s/p renal transplantation at [facility] in 2012.  6. Anemia of chronic disease 7. Type II diabetes 8. HTN 9. Hyperlipidemia 10. OSA: On bipap.  11. Ischemic cardiomyopathy: EF 35-40% in past.  - Cardiolite (1/20) with EF 47%.  - Echo (1/20): EF 50-55%, mild MR, RV moderately dilated with moderately decreased systolic function, PASP 73 mmHg, moderate TR.   - Echo (3/20): EF 60-65%, PASP 75 mmHg, mildly dilated RV with mildly decreased systolic function.  - Echo (2/21): EF 45-50%, global HK, moderately dilated RV with moderately decreased systolic function, D-shaped interventricular septum, dilated IVC.  - RHC (2/21): mean RA 19, PA 78/31 mean 51, mean PCWP 26, CI 3.26, PVR 3.25 12. Obesity: s/p gastric bypass in 2015.  13. Bradycardia with beta blocker.   Past Surgical History:  Procedure Laterality Date  . BARIATRIC SURGERY    .  CARDIAC CATHETERIZATION     [facility]  . CARDIAC CATHETERIZATION  05/03/2019  . CATARACT EXTRACTION W/PHACO Right 10/29/2017   Procedure: CATARACT EXTRACTION PHACO AND INTRAOCULAR LENS PLACEMENT ([place])  RIGHT DIABETIC;  Surgeon: [name], MD;  Location: [place];  Service: Ophthalmology;  Laterality: Right;  Diabetic - insulin  . CATARACT EXTRACTION W/PHACO Left 11/18/2017   Procedure: CATARACT EXTRACTION PHACO AND INTRAOCULAR LENS PLACEMENT ([place]) LEFT  IVA/TOPICAL;  Surgeon: [name], MD;  Location: [place];  Service: Ophthalmology;  Laterality: Left;  DIABETES - insulin sleep apnea  . COLONOSCOPY WITH PROPOFOL N/A 04/22/2013   Procedure: COLONOSCOPY WITH PROPOFOL;  Surgeon: [name], MD;  Location: [facility];  Service: Endoscopy;  Laterality: N/A;  . CORONARY ARTERY BYPASS GRAFT  2005   X 4  . DG ANGIO AV SHUNT*L*     right and left upper arms  . FASCIOTOMY  03/03/2012   Procedure: FASCIOTOMY;  Surgeon: [name], MD;  Location: [place];  Service: Orthopedics;  Laterality: Right;  FASCIOTOMY RIGHT SMALL FINGER  . FASCIOTOMY Left 08/17/2013   Procedure: FASCIOTOMY LEFT RING;  Surgeon: [name], MD;  Location: [place];  Service: Orthopedics;  Laterality: Left;  . INCISION AND DRAINAGE ABSCESS Left 10/15/2015   Procedure: INCISION AND DRAINAGE ABSCESS;  Surgeon: [name], MD;  Location: [facility];  Service: General;  Laterality: Left;  . KIDNEY TRANSPLANT  09/13/2010   cadaver--at [facility]  . RIGHT HEART CATH N/A 11/15/2016   Procedure: RIGHT HEART CATH;  Surgeon: [name], MD;  Location: [place] [facility];  Service: Cardiovascular;  Laterality: N/A;  . RIGHT HEART CATH N/A 05/03/2019   Procedure: RIGHT HEART CATH;  Surgeon: [name], MD;  Location: [place] [facility];  Service: Cardiovascular;  Laterality: N/A;  . TYMPANIC MEMBRANE REPAIR  1/12   left  . VASECTOMY       Current Outpatient Medications  Medication Sig Dispense Refill  . beclomethasone (QVAR) 80 MCG/ACT inhaler Inhale 2 puffs into the lungs 2 (two) times daily.     Marland Kitchen buPROPion (WELLBUTRIN SR) 150 MG 12 hr tablet Take 150 mg by mouth 2 (two) times daily.    . colchicine 0.6 MG tablet Take 0.6 mg by mouth daily as needed.    . dapagliflozin propanediol (FARXIGA) 10 MG TABS tablet Take 1 tablet (10 mg total) by mouth daily before breakfast. 30 tablet 6  . diclofenac Sodium  (VOLTAREN) 1 % GEL Apply 2 g topically 3 (three) times daily as needed. 100 g 0  . ELIQUIS 5 MG TABS tablet TAKE 1 TABLET BY MOUTH TWICE DAILY 180 tablet 1  . febuxostat (ULORIC) 40 MG tablet Take 40 mg by mouth daily.    Marland Kitchen FERREX 150 150 MG capsule Take 150 mg by mouth daily.     . fluticasone (FLONASE) 50 MCG/ACT nasal spray PLACE 2 SPRAYS INTO BOTH NOSTRILS DAILY. 16 g 11  . gabapentin (NEURONTIN) 300 MG capsule Take 300 mg by mouth 2 (two) times daily.    Marland Kitchen HUMALOG KWIKPEN 100 UNIT/ML KwikPen Inject 5-10 Units into the skin 3 (three) times daily. Sliding scale    . insulin glargine (LANTUS) 100 unit/mL SOPN Inject 40 Units into the skin at bedtime.     . metolazone (ZAROXOLYN) 5 MG tablet Take 1 tablet (5 mg total) by mouth 2 (two) times a week. On tuesdays Sundays    . mometasone (NASONEX) 50  MCG/ACT nasal spray Place 2 sprays into the nose daily as needed.    . montelukast (SINGULAIR) 10 MG tablet TAKE 1 TABLET BY MOUTH AT BEDTIME. 90 tablet 3  . Multiple Vitamin (MULTIVITAMIN WITH MINERALS) TABS tablet Take 1 tablet by mouth daily.    . mycophenolate (MYFORTIC) 180 MG EC tablet Take 360 mg by mouth 2 (two) times daily.    Marland Kitchen omeprazole (PRILOSEC) 20 MG capsule Take 1 capsule (20 mg total) by mouth daily. 90 capsule 3  . potassium chloride (MICRO-K) 10 MEQ CR capsule Take 4 capsules (40 mEq total) by mouth 2 (two) times daily. Take an extra tab on Tue and Fri with metolazone 240 capsule 4  . rosuvastatin (CRESTOR) 10 MG tablet TAKE 1 TABLET BY MOUTH DAILY 90 tablet 2  . tacrolimus (PROGRAF) 1 MG capsule 3 mg in AM and 4m in PM    . tadalafil, PAH, (ADCIRCA) 20 MG tablet Take 1 tablet (20 mg total) by mouth every other day. 60 tablet 1  . tamsulosin (FLOMAX) 0.4 MG CAPS capsule Take 0.4 mg by mouth daily.     .Marland Kitchentorsemide (DEMADEX) 20 MG tablet Take 100 mg by mouth 2 (two) times daily.    . vitamin C (ASCORBIC ACID) 250 MG tablet Take 1 tablet (250 mg total) by mouth daily. 30 tablet 0  .  zinc sulfate 220 (50 Zn) MG capsule Take 1 capsule (220 mg total) by mouth 2 (two) times daily. 60 capsule 0   No current facility-administered medications for this encounter.    Allergies:   Contrast media [iodinated diagnostic agents] and Ibuprofen   Social History:  The patient  reports that he quit smoking about 25 years ago. His smoking use included cigars. He has never used smokeless tobacco. He reports current alcohol use. He reports that he does not use drugs.   Family History:  The patient's family history includes Breast cancer in his maternal grandmother; Diabetes in his maternal grandmother; Heart disease in his father; Kidney disease in his father; Kidney failure in his father; Liver cancer in his paternal grandmother and paternal uncle; Valvular heart disease in his mother.   ROS:  Please see the history of present illness.   All other systems are personally reviewed and negative.  Vitals:   04/03/20 0956  BP: 130/80  Pulse: 76  SpO2: 93%   Wt Readings from Last 3 Encounters:  04/03/20 121.4 kg (267 lb 9.6 oz)  03/30/20 118.4 kg (261 lb)  03/23/20 117.9 kg (260 lb)   Exam:   General:  Walked in the clinic. No resp difficulty HEENT: normal Neck: supple.JVP 7-8 Carotids 2+ bilat; no bruits. No lymphadenopathy or thryomegaly appreciated. Cor: PMI nondisplaced. Irregular rate & rhythm. No rubs, gallops or murmurs. Lungs: clear Abdomen: soft, nontender, nondistended. No hepatosplenomegaly. No bruits or masses. Good bowel sounds. Extremities: no cyanosis, clubbing, rash, edema Neuro: alert & orientedx3, cranial nerves grossly intact. moves all 4 extremities w/o difficulty. Affect pleasant Recent Labs: 05/03/2019: TSH 1.615 06/01/2019: B Natriuretic Peptide 246.0 08/04/2019: ALT 19; Magnesium 1.6 03/07/2020: Hemoglobin 13.2; Platelets 208.0 03/31/2020: BUN 72; Creatinine, Ser 2.17; Potassium 3.3; Sodium 140  Personally reviewed   Wt Readings from Last 3 Encounters:  04/03/20  121.4 kg (267 lb 9.6 oz)  03/30/20 118.4 kg (261 lb)  03/23/20 117.9 kg (260 lb)    ASSESSMENT AND PLAN:  1. Heart failure with mid range EF: With prominent RV failure/pulmonary hypertension. ?If RV failure is related to severe  OSA, he was a remote smoker.  Echo in 2/21 with EF 45-50%, moderately dilated RV with moderately decreased systolic function, moderate pulmonary hypertension. NYHA class II today.  - Volume status stable. Continue torsemide 100 mg bid.  - Continue metolazone twice a week on Tuesday and Sunday.  - He will stay off lisinopril and spironolactone with rise in creatinine.  - Continue dapagliflozin 10 mg daily.  - Unable to confirm that his insurance will cover Cardiomems so will manage him without this.   2. Atrial fibrillation: Chronic.  -Rate controlled.  - He will continue Eliquis.  - Not on beta blocker with history of bradycardia.   3. CAD: S/p CABG 2005.  Low risk Cardiolite in 1/20.   - No chest pain.  - No ASA given Eliquis use.  - Continue Crestor 10 mg daily, check lipids at next appt.  4. CKD: Stage 3.  S/p renal transplantation in 2012. He remains on mycophenolate and tacrolimus.   Check BMET  5. OSA: Continue Bipap every night.  6. HTN: BP ok off lisinopril and spironolactone.  7. Pulmonary hypertension: Severe pulmonary hypertension by 3/20 echo.  RHC in 2/21 with moderate, primarily pulmonary venous hypertension (PVR 3.25 WU). Suspect that there is also a component of group 3 PH (OSA, ?unrecognized OHS).  8. Gout: Would try to use colchicine instead of prednisone as much as possible, think prednisone contributes to volume overload.    Reds Clip stable. Follow up in 4 weeks. Check BMET today. Discussed low salt food choices and limiting fluid intake to < 2 liters per day.   Jeanmarie Hubert, NP  04/03/2020  [facility] [place] [phone] (office) [phone] (fax)

## 2020-04-03 ENCOUNTER — Ambulatory Visit (HOSPITAL_COMMUNITY)
Admission: RE | Admit: 2020-04-03 | Discharge: 2020-04-03 | Disposition: A | Discharge status: Home / Self Care | Payer: 59 | Source: Ambulatory Visit | Attending: Adult Health | Admitting: Adult Health

## 2020-04-03 ENCOUNTER — Encounter (HOSPITAL_COMMUNITY): Payer: Self-pay

## 2020-04-03 ENCOUNTER — Other Ambulatory Visit: Payer: Self-pay

## 2020-04-03 VITALS — BP 130/80 | HR 76 | Wt 267.6 lb

## 2020-04-03 DIAGNOSIS — Z87891 Personal history of nicotine dependence: Secondary | ICD-10-CM | POA: Insufficient documentation

## 2020-04-03 DIAGNOSIS — Z803 Family history of malignant neoplasm of breast: Secondary | ICD-10-CM | POA: Diagnosis not present

## 2020-04-03 DIAGNOSIS — I272 Pulmonary hypertension, unspecified: Secondary | ICD-10-CM | POA: Diagnosis not present

## 2020-04-03 DIAGNOSIS — Z791 Long term (current) use of non-steroidal anti-inflammatories (NSAID): Secondary | ICD-10-CM | POA: Diagnosis not present

## 2020-04-03 DIAGNOSIS — Z794 Long term (current) use of insulin: Secondary | ICD-10-CM | POA: Diagnosis not present

## 2020-04-03 DIAGNOSIS — Z79899 Other long term (current) drug therapy: Secondary | ICD-10-CM | POA: Diagnosis not present

## 2020-04-03 DIAGNOSIS — I482 Chronic atrial fibrillation, unspecified: Secondary | ICD-10-CM | POA: Diagnosis not present

## 2020-04-03 DIAGNOSIS — E785 Hyperlipidemia, unspecified: Secondary | ICD-10-CM | POA: Diagnosis not present

## 2020-04-03 DIAGNOSIS — I770 Arteriovenous fistula, acquired: Secondary | ICD-10-CM | POA: Diagnosis not present

## 2020-04-03 DIAGNOSIS — M109 Gout, unspecified: Secondary | ICD-10-CM | POA: Diagnosis not present

## 2020-04-03 DIAGNOSIS — Z91041 Radiographic dye allergy status: Secondary | ICD-10-CM | POA: Insufficient documentation

## 2020-04-03 DIAGNOSIS — I130 Hypertensive heart and chronic kidney disease with heart failure and stage 1 through stage 4 chronic kidney disease, or unspecified chronic kidney disease: Secondary | ICD-10-CM | POA: Diagnosis not present

## 2020-04-03 DIAGNOSIS — E1122 Type 2 diabetes mellitus with diabetic chronic kidney disease: Secondary | ICD-10-CM | POA: Diagnosis not present

## 2020-04-03 DIAGNOSIS — Z951 Presence of aortocoronary bypass graft: Secondary | ICD-10-CM | POA: Diagnosis not present

## 2020-04-03 DIAGNOSIS — Z833 Family history of diabetes mellitus: Secondary | ICD-10-CM | POA: Insufficient documentation

## 2020-04-03 DIAGNOSIS — I5032 Chronic diastolic (congestive) heart failure: Secondary | ICD-10-CM | POA: Diagnosis not present

## 2020-04-03 DIAGNOSIS — Z9884 Bariatric surgery status: Secondary | ICD-10-CM | POA: Insufficient documentation

## 2020-04-03 DIAGNOSIS — Z940 Kidney transplant status: Secondary | ICD-10-CM | POA: Diagnosis not present

## 2020-04-03 DIAGNOSIS — Z8249 Family history of ischemic heart disease and other diseases of the circulatory system: Secondary | ICD-10-CM | POA: Insufficient documentation

## 2020-04-03 DIAGNOSIS — N183 Chronic kidney disease, stage 3 unspecified: Secondary | ICD-10-CM | POA: Diagnosis not present

## 2020-04-03 DIAGNOSIS — Z7951 Long term (current) use of inhaled steroids: Secondary | ICD-10-CM | POA: Insufficient documentation

## 2020-04-03 DIAGNOSIS — I2510 Atherosclerotic heart disease of native coronary artery without angina pectoris: Secondary | ICD-10-CM | POA: Insufficient documentation

## 2020-04-03 DIAGNOSIS — Z800 Family history of malignant neoplasm of digestive organs: Secondary | ICD-10-CM | POA: Insufficient documentation

## 2020-04-03 DIAGNOSIS — I255 Ischemic cardiomyopathy: Secondary | ICD-10-CM | POA: Insufficient documentation

## 2020-04-03 DIAGNOSIS — Z886 Allergy status to analgesic agent status: Secondary | ICD-10-CM | POA: Insufficient documentation

## 2020-04-03 DIAGNOSIS — G4733 Obstructive sleep apnea (adult) (pediatric): Secondary | ICD-10-CM | POA: Diagnosis not present

## 2020-04-03 DIAGNOSIS — I132 Hypertensive heart and chronic kidney disease with heart failure and with stage 5 chronic kidney disease, or end stage renal disease: Secondary | ICD-10-CM | POA: Diagnosis present

## 2020-04-03 DIAGNOSIS — Z841 Family history of disorders of kidney and ureter: Secondary | ICD-10-CM | POA: Insufficient documentation

## 2020-04-03 DIAGNOSIS — Z7901 Long term (current) use of anticoagulants: Secondary | ICD-10-CM | POA: Insufficient documentation

## 2020-04-03 DIAGNOSIS — J45909 Unspecified asthma, uncomplicated: Secondary | ICD-10-CM | POA: Insufficient documentation

## 2020-04-03 LAB — BASIC METABOLIC PANEL
Anion gap: 12 (ref 5–15)
BUN: 65 mg/dL — ABNORMAL HIGH (ref 6–20)
CO2: 33 mmol/L — ABNORMAL HIGH (ref 22–32)
Calcium: 9.4 mg/dL (ref 8.9–10.3)
Chloride: 93 mmol/L — ABNORMAL LOW (ref 98–111)
Creatinine, Ser: 2.38 mg/dL — ABNORMAL HIGH (ref 0.61–1.24)
GFR, Estimated: 30 mL/min — ABNORMAL LOW (ref >60)
Glucose, Bld: 146 mg/dL — ABNORMAL HIGH (ref 70–99)
Potassium: 3.6 mmol/L (ref 3.5–5.1)
Sodium: 138 mmol/L (ref 135–145)

## 2020-04-03 NOTE — Patient Instructions (Signed)
It was great to see you today! No medication changes are needed at this time.  Labs today We will only contact you if something comes back abnormal or we need to make some changes. Otherwise no news is good news!  Your physician recommends that you schedule a follow-up appointment in: 4 weeks   If you have any questions or concerns before your next appointment please send [name] a message through [place] or call our office at [phone].    TO LEAVE A MESSAGE FOR THE NURSE SELECT OPTION 2, PLEASE LEAVE A MESSAGE INCLUDING: . YOUR NAME . DATE OF BIRTH . CALL BACK NUMBER . REASON FOR CALL**this is important as we prioritize the call backs  YOU WILL RECEIVE A CALL BACK THE SAME DAY AS LONG AS YOU CALL BEFORE 4:00 PM  Do the following things EVERYDAY: 1) Weigh yourself in the morning before breakfast. Write it down and keep it in a log. 2) Take your medicines as prescribed 3) Eat low salt foods-Limit salt (sodium) to 2000 mg per day.  4) Stay as active as you can everyday 5) Limit all fluids for the day to less than 2 liters

## 2020-04-11 DIAGNOSIS — Z03818 Encounter for observation for suspected exposure to other biological agents ruled out: Secondary | ICD-10-CM | POA: Diagnosis not present

## 2020-04-11 DIAGNOSIS — Z20822 Contact with and (suspected) exposure to covid-19: Secondary | ICD-10-CM | POA: Diagnosis not present

## 2020-04-14 DIAGNOSIS — Z79899 Other long term (current) drug therapy: Secondary | ICD-10-CM | POA: Diagnosis not present

## 2020-04-14 DIAGNOSIS — I129 Hypertensive chronic kidney disease with stage 1 through stage 4 chronic kidney disease, or unspecified chronic kidney disease: Secondary | ICD-10-CM | POA: Diagnosis not present

## 2020-04-14 DIAGNOSIS — Z4822 Encounter for aftercare following kidney transplant: Secondary | ICD-10-CM | POA: Diagnosis not present

## 2020-04-14 DIAGNOSIS — Z940 Kidney transplant status: Secondary | ICD-10-CM | POA: Diagnosis not present

## 2020-04-14 DIAGNOSIS — N183 Chronic kidney disease, stage 3 unspecified: Secondary | ICD-10-CM | POA: Diagnosis not present

## 2020-04-14 DIAGNOSIS — N2581 Secondary hyperparathyroidism of renal origin: Secondary | ICD-10-CM | POA: Diagnosis not present

## 2020-04-14 DIAGNOSIS — Z6838 Body mass index (BMI) 38.0-38.9, adult: Secondary | ICD-10-CM | POA: Diagnosis not present

## 2020-04-14 DIAGNOSIS — E1121 Type 2 diabetes mellitus with diabetic nephropathy: Secondary | ICD-10-CM | POA: Diagnosis not present

## 2020-04-14 DIAGNOSIS — E1122 Type 2 diabetes mellitus with diabetic chronic kidney disease: Secondary | ICD-10-CM | POA: Diagnosis not present

## 2020-04-14 DIAGNOSIS — Z5181 Encounter for therapeutic drug level monitoring: Secondary | ICD-10-CM | POA: Diagnosis not present

## 2020-04-14 DIAGNOSIS — E669 Obesity, unspecified: Secondary | ICD-10-CM | POA: Diagnosis not present

## 2020-04-17 ENCOUNTER — Other Ambulatory Visit
Admission: RE | Admit: 2020-04-17 | Discharge: 2020-04-17 | Disposition: A | Discharge status: Home / Self Care | Payer: 59 | Source: Ambulatory Visit | Attending: Cardiology | Admitting: Cardiology

## 2020-04-17 DIAGNOSIS — I5032 Chronic diastolic (congestive) heart failure: Secondary | ICD-10-CM | POA: Insufficient documentation

## 2020-04-17 LAB — BASIC METABOLIC PANEL
Anion gap: 12 (ref 5–15)
BUN: 59 mg/dL — ABNORMAL HIGH (ref 6–20)
CO2: 33 mmol/L — ABNORMAL HIGH (ref 22–32)
Calcium: 9.4 mg/dL (ref 8.9–10.3)
Chloride: 97 mmol/L — ABNORMAL LOW (ref 98–111)
Creatinine, Ser: 1.84 mg/dL — ABNORMAL HIGH (ref 0.61–1.24)
GFR, Estimated: 41 mL/min — ABNORMAL LOW (ref >60)
Glucose, Bld: 233 mg/dL — ABNORMAL HIGH (ref 70–99)
Potassium: 3.3 mmol/L — ABNORMAL LOW (ref 3.5–5.1)
Sodium: 142 mmol/L (ref 135–145)

## 2020-04-18 ENCOUNTER — Telehealth (HOSPITAL_COMMUNITY): Payer: Self-pay

## 2020-04-18 DIAGNOSIS — I5032 Chronic diastolic (congestive) heart failure: Secondary | ICD-10-CM

## 2020-04-18 MED ORDER — POTASSIUM CHLORIDE ER 10 MEQ PO CPCR
ORAL_CAPSULE | ORAL | 11 refills | Status: DC
Start: 2020-04-18 — End: 2020-05-02

## 2020-04-18 NOTE — Telephone Encounter (Signed)
-----  Message from [name], MD sent at 04/17/2020 10:34 AM EST ----- Creatinine better.  Add additional KCl 20 daily to regimen.  BMET 10 days.

## 2020-04-18 NOTE — Telephone Encounter (Signed)
Patient advised and verbalized understanding. Lab appt scheduled,lab order entered. Med list updated to reflect changes

## 2020-04-25 ENCOUNTER — Telehealth (HOSPITAL_COMMUNITY): Payer: Self-pay | Admitting: Cardiology

## 2020-04-25 ENCOUNTER — Other Ambulatory Visit: Payer: Self-pay | Admitting: Internal Medicine

## 2020-04-25 NOTE — Telephone Encounter (Signed)
Late entry Pts insurance does not require prior auth/ "approved" however unable to provide VOB. Patient was advised to contact insurance company directly and obtain VOB for services. Procedure cpt codes and phone number provided  Pt to call with updates

## 2020-04-26 DIAGNOSIS — E669 Obesity, unspecified: Secondary | ICD-10-CM | POA: Diagnosis not present

## 2020-04-26 DIAGNOSIS — E1142 Type 2 diabetes mellitus with diabetic polyneuropathy: Secondary | ICD-10-CM | POA: Diagnosis not present

## 2020-04-26 DIAGNOSIS — Z7952 Long term (current) use of systemic steroids: Secondary | ICD-10-CM | POA: Diagnosis not present

## 2020-04-26 DIAGNOSIS — E113293 Type 2 diabetes mellitus with mild nonproliferative diabetic retinopathy without macular edema, bilateral: Secondary | ICD-10-CM | POA: Diagnosis not present

## 2020-04-26 DIAGNOSIS — E1165 Type 2 diabetes mellitus with hyperglycemia: Secondary | ICD-10-CM | POA: Diagnosis not present

## 2020-04-26 DIAGNOSIS — E1169 Type 2 diabetes mellitus with other specified complication: Secondary | ICD-10-CM | POA: Diagnosis not present

## 2020-04-26 DIAGNOSIS — Z794 Long term (current) use of insulin: Secondary | ICD-10-CM | POA: Diagnosis not present

## 2020-04-27 DIAGNOSIS — G4733 Obstructive sleep apnea (adult) (pediatric): Secondary | ICD-10-CM | POA: Diagnosis not present

## 2020-04-27 DIAGNOSIS — E662 Morbid (severe) obesity with alveolar hypoventilation: Secondary | ICD-10-CM | POA: Diagnosis not present

## 2020-04-30 DIAGNOSIS — E1165 Type 2 diabetes mellitus with hyperglycemia: Secondary | ICD-10-CM | POA: Diagnosis not present

## 2020-05-01 ENCOUNTER — Other Ambulatory Visit: Payer: Self-pay | Admitting: Rheumatology

## 2020-05-01 ENCOUNTER — Ambulatory Visit (HOSPITAL_COMMUNITY)
Admission: RE | Admit: 2020-05-01 | Discharge: 2020-05-01 | Disposition: A | Discharge status: Home / Self Care | Payer: 59 | Source: Ambulatory Visit | Attending: Internal Medicine | Admitting: Internal Medicine

## 2020-05-01 ENCOUNTER — Other Ambulatory Visit: Payer: Self-pay

## 2020-05-01 DIAGNOSIS — I5032 Chronic diastolic (congestive) heart failure: Secondary | ICD-10-CM | POA: Diagnosis not present

## 2020-05-01 LAB — BASIC METABOLIC PANEL
Anion gap: 14 (ref 5–15)
BUN: 64 mg/dL — ABNORMAL HIGH (ref 6–20)
CO2: 33 mmol/L — ABNORMAL HIGH (ref 22–32)
Calcium: 9.8 mg/dL (ref 8.9–10.3)
Chloride: 92 mmol/L — ABNORMAL LOW (ref 98–111)
Creatinine, Ser: 2.21 mg/dL — ABNORMAL HIGH (ref 0.61–1.24)
GFR, Estimated: 33 mL/min — ABNORMAL LOW (ref >60)
Glucose, Bld: 272 mg/dL — ABNORMAL HIGH (ref 70–99)
Potassium: 3.4 mmol/L — ABNORMAL LOW (ref 3.5–5.1)
Sodium: 139 mmol/L (ref 135–145)

## 2020-05-02 ENCOUNTER — Other Ambulatory Visit (HOSPITAL_COMMUNITY): Payer: Self-pay | Admitting: Cardiology

## 2020-05-02 ENCOUNTER — Telehealth (HOSPITAL_COMMUNITY): Payer: Self-pay

## 2020-05-02 DIAGNOSIS — D2271 Melanocytic nevi of right lower limb, including hip: Secondary | ICD-10-CM | POA: Diagnosis not present

## 2020-05-02 DIAGNOSIS — L821 Other seborrheic keratosis: Secondary | ICD-10-CM | POA: Diagnosis not present

## 2020-05-02 DIAGNOSIS — D2261 Melanocytic nevi of right upper limb, including shoulder: Secondary | ICD-10-CM | POA: Diagnosis not present

## 2020-05-02 DIAGNOSIS — D225 Melanocytic nevi of trunk: Secondary | ICD-10-CM | POA: Diagnosis not present

## 2020-05-02 DIAGNOSIS — D2272 Melanocytic nevi of left lower limb, including hip: Secondary | ICD-10-CM | POA: Diagnosis not present

## 2020-05-02 DIAGNOSIS — D2262 Melanocytic nevi of left upper limb, including shoulder: Secondary | ICD-10-CM | POA: Diagnosis not present

## 2020-05-02 MED ORDER — POTASSIUM CHLORIDE ER 10 MEQ PO CPCR
60.0000 meq | ORAL_CAPSULE | Freq: Two times a day (BID) | ORAL | 3 refills | Status: DC
Start: 2020-05-02 — End: 2020-06-15

## 2020-05-02 NOTE — Telephone Encounter (Signed)
-----  Message from [name], MD sent at 05/01/2020  1:37 PM EST ----- Creatinine basically at baseline.  Increase total daily KCl by 20 mEq.

## 2020-05-02 NOTE — Telephone Encounter (Signed)
Malena Edman, RN  05/02/2020 1:45 PM EST Back to Top     Patient advised and verbalized understanding. Med list updated

## 2020-05-04 ENCOUNTER — Encounter (HOSPITAL_COMMUNITY): Payer: Self-pay

## 2020-05-04 ENCOUNTER — Other Ambulatory Visit (HOSPITAL_COMMUNITY): Payer: Self-pay

## 2020-05-04 NOTE — Progress Notes (Signed)
Today had a home visit with [name].  He states been feeling good.  He exercising at a gym atleast 3 times a week.  He is concerned about losing weight but he denies any chest pain, shortness of breath, headaches or dizziness.  He states breathing is a lot better.  He watches what he eats.  He states has more energy.  He has all his medications and aware of how to take them.  He is going on vacation this weekend to [place] with family.  He is aware of up coming appts.  He has everything for daily living.  Will continue to visit for heart failure.   [place] [phone]

## 2020-05-09 DIAGNOSIS — Z4822 Encounter for aftercare following kidney transplant: Secondary | ICD-10-CM | POA: Diagnosis not present

## 2020-05-09 DIAGNOSIS — N400 Enlarged prostate without lower urinary tract symptoms: Secondary | ICD-10-CM | POA: Diagnosis not present

## 2020-05-09 DIAGNOSIS — Z9989 Dependence on other enabling machines and devices: Secondary | ICD-10-CM | POA: Diagnosis not present

## 2020-05-09 DIAGNOSIS — D509 Iron deficiency anemia, unspecified: Secondary | ICD-10-CM | POA: Diagnosis not present

## 2020-05-09 DIAGNOSIS — Z951 Presence of aortocoronary bypass graft: Secondary | ICD-10-CM | POA: Diagnosis not present

## 2020-05-09 DIAGNOSIS — N183 Chronic kidney disease, stage 3 unspecified: Secondary | ICD-10-CM | POA: Diagnosis not present

## 2020-05-09 DIAGNOSIS — D849 Immunodeficiency, unspecified: Secondary | ICD-10-CM | POA: Diagnosis not present

## 2020-05-09 DIAGNOSIS — I482 Chronic atrial fibrillation, unspecified: Secondary | ICD-10-CM | POA: Diagnosis not present

## 2020-05-09 DIAGNOSIS — Z940 Kidney transplant status: Secondary | ICD-10-CM | POA: Diagnosis not present

## 2020-05-09 DIAGNOSIS — I2510 Atherosclerotic heart disease of native coronary artery without angina pectoris: Secondary | ICD-10-CM | POA: Diagnosis not present

## 2020-05-09 DIAGNOSIS — Z79899 Other long term (current) drug therapy: Secondary | ICD-10-CM | POA: Diagnosis not present

## 2020-05-09 DIAGNOSIS — I509 Heart failure, unspecified: Secondary | ICD-10-CM | POA: Diagnosis not present

## 2020-05-09 DIAGNOSIS — E1122 Type 2 diabetes mellitus with diabetic chronic kidney disease: Secondary | ICD-10-CM | POA: Diagnosis not present

## 2020-05-09 DIAGNOSIS — N2581 Secondary hyperparathyroidism of renal origin: Secondary | ICD-10-CM | POA: Diagnosis not present

## 2020-05-09 DIAGNOSIS — E8881 Metabolic syndrome: Secondary | ICD-10-CM | POA: Diagnosis not present

## 2020-05-09 DIAGNOSIS — I272 Pulmonary hypertension, unspecified: Secondary | ICD-10-CM | POA: Diagnosis not present

## 2020-05-09 DIAGNOSIS — I130 Hypertensive heart and chronic kidney disease with heart failure and stage 1 through stage 4 chronic kidney disease, or unspecified chronic kidney disease: Secondary | ICD-10-CM | POA: Diagnosis not present

## 2020-05-09 DIAGNOSIS — G4733 Obstructive sleep apnea (adult) (pediatric): Secondary | ICD-10-CM | POA: Diagnosis not present

## 2020-05-09 DIAGNOSIS — N179 Acute kidney failure, unspecified: Secondary | ICD-10-CM | POA: Diagnosis not present

## 2020-05-09 DIAGNOSIS — E869 Volume depletion, unspecified: Secondary | ICD-10-CM | POA: Diagnosis not present

## 2020-05-09 DIAGNOSIS — E1121 Type 2 diabetes mellitus with diabetic nephropathy: Secondary | ICD-10-CM | POA: Diagnosis not present

## 2020-05-09 DIAGNOSIS — I5040 Unspecified combined systolic (congestive) and diastolic (congestive) heart failure: Secondary | ICD-10-CM | POA: Diagnosis not present

## 2020-05-09 DIAGNOSIS — E876 Hypokalemia: Secondary | ICD-10-CM | POA: Diagnosis not present

## 2020-05-09 DIAGNOSIS — K219 Gastro-esophageal reflux disease without esophagitis: Secondary | ICD-10-CM | POA: Diagnosis not present

## 2020-05-10 DIAGNOSIS — Z79899 Other long term (current) drug therapy: Secondary | ICD-10-CM | POA: Diagnosis not present

## 2020-05-10 DIAGNOSIS — Z940 Kidney transplant status: Secondary | ICD-10-CM | POA: Diagnosis not present

## 2020-05-11 DIAGNOSIS — Z7960 Long term (current) use of unspecified immunomodulators and immunosuppressants: Secondary | ICD-10-CM | POA: Insufficient documentation

## 2020-05-12 ENCOUNTER — Emergency Department: Payer: 59

## 2020-05-12 ENCOUNTER — Other Ambulatory Visit: Payer: Self-pay | Admitting: Nephrology

## 2020-05-12 ENCOUNTER — Emergency Department
Admission: EM | Admit: 2020-05-12 | Discharge: 2020-05-12 | Disposition: A | Discharge status: Home / Self Care | Payer: 59 | Source: Home / Self Care | Attending: Emergency Medicine | Admitting: Emergency Medicine

## 2020-05-12 ENCOUNTER — Other Ambulatory Visit: Payer: Self-pay

## 2020-05-12 DIAGNOSIS — I5042 Chronic combined systolic (congestive) and diastolic (congestive) heart failure: Secondary | ICD-10-CM | POA: Insufficient documentation

## 2020-05-12 DIAGNOSIS — M1000 Idiopathic gout, unspecified site: Secondary | ICD-10-CM | POA: Diagnosis not present

## 2020-05-12 DIAGNOSIS — I10 Essential (primary) hypertension: Secondary | ICD-10-CM | POA: Diagnosis not present

## 2020-05-12 DIAGNOSIS — M109 Gout, unspecified: Secondary | ICD-10-CM | POA: Diagnosis present

## 2020-05-12 DIAGNOSIS — Z961 Presence of intraocular lens: Secondary | ICD-10-CM | POA: Diagnosis present

## 2020-05-12 DIAGNOSIS — Z7984 Long term (current) use of oral hypoglycemic drugs: Secondary | ICD-10-CM | POA: Insufficient documentation

## 2020-05-12 DIAGNOSIS — S8011XA Contusion of right lower leg, initial encounter: Secondary | ICD-10-CM | POA: Diagnosis present

## 2020-05-12 DIAGNOSIS — W541XXA Struck by dog, initial encounter: Secondary | ICD-10-CM | POA: Diagnosis not present

## 2020-05-12 DIAGNOSIS — I272 Pulmonary hypertension, unspecified: Secondary | ICD-10-CM | POA: Diagnosis not present

## 2020-05-12 DIAGNOSIS — X58XXXA Exposure to other specified factors, initial encounter: Secondary | ICD-10-CM | POA: Insufficient documentation

## 2020-05-12 DIAGNOSIS — Z79899 Other long term (current) drug therapy: Secondary | ICD-10-CM | POA: Insufficient documentation

## 2020-05-12 DIAGNOSIS — J45909 Unspecified asthma, uncomplicated: Secondary | ICD-10-CM | POA: Diagnosis present

## 2020-05-12 DIAGNOSIS — I2510 Atherosclerotic heart disease of native coronary artery without angina pectoris: Secondary | ICD-10-CM | POA: Insufficient documentation

## 2020-05-12 DIAGNOSIS — Z7901 Long term (current) use of anticoagulants: Secondary | ICD-10-CM | POA: Insufficient documentation

## 2020-05-12 DIAGNOSIS — E876 Hypokalemia: Secondary | ICD-10-CM | POA: Diagnosis present

## 2020-05-12 DIAGNOSIS — Z951 Presence of aortocoronary bypass graft: Secondary | ICD-10-CM | POA: Insufficient documentation

## 2020-05-12 DIAGNOSIS — I5032 Chronic diastolic (congestive) heart failure: Secondary | ICD-10-CM | POA: Diagnosis not present

## 2020-05-12 DIAGNOSIS — L0390 Cellulitis, unspecified: Secondary | ICD-10-CM | POA: Diagnosis present

## 2020-05-12 DIAGNOSIS — R600 Localized edema: Secondary | ICD-10-CM | POA: Diagnosis not present

## 2020-05-12 DIAGNOSIS — Z940 Kidney transplant status: Secondary | ICD-10-CM | POA: Diagnosis not present

## 2020-05-12 DIAGNOSIS — M25521 Pain in right elbow: Secondary | ICD-10-CM | POA: Diagnosis not present

## 2020-05-12 DIAGNOSIS — Z0389 Encounter for observation for other suspected diseases and conditions ruled out: Secondary | ICD-10-CM | POA: Diagnosis not present

## 2020-05-12 DIAGNOSIS — I132 Hypertensive heart and chronic kidney disease with heart failure and with stage 5 chronic kidney disease, or end stage renal disease: Secondary | ICD-10-CM | POA: Insufficient documentation

## 2020-05-12 DIAGNOSIS — K219 Gastro-esophageal reflux disease without esophagitis: Secondary | ICD-10-CM | POA: Diagnosis present

## 2020-05-12 DIAGNOSIS — M1030 Gout due to renal impairment, unspecified site: Secondary | ICD-10-CM | POA: Diagnosis not present

## 2020-05-12 DIAGNOSIS — Z794 Long term (current) use of insulin: Secondary | ICD-10-CM | POA: Insufficient documentation

## 2020-05-12 DIAGNOSIS — N186 End stage renal disease: Secondary | ICD-10-CM | POA: Insufficient documentation

## 2020-05-12 DIAGNOSIS — E1122 Type 2 diabetes mellitus with diabetic chronic kidney disease: Secondary | ICD-10-CM | POA: Insufficient documentation

## 2020-05-12 DIAGNOSIS — I872 Venous insufficiency (chronic) (peripheral): Secondary | ICD-10-CM | POA: Diagnosis not present

## 2020-05-12 DIAGNOSIS — Z87891 Personal history of nicotine dependence: Secondary | ICD-10-CM | POA: Insufficient documentation

## 2020-05-12 DIAGNOSIS — Z85038 Personal history of other malignant neoplasm of large intestine: Secondary | ICD-10-CM | POA: Insufficient documentation

## 2020-05-12 DIAGNOSIS — Z992 Dependence on renal dialysis: Secondary | ICD-10-CM | POA: Insufficient documentation

## 2020-05-12 DIAGNOSIS — N400 Enlarged prostate without lower urinary tract symptoms: Secondary | ICD-10-CM | POA: Diagnosis present

## 2020-05-12 DIAGNOSIS — L03113 Cellulitis of right upper limb: Secondary | ICD-10-CM | POA: Diagnosis not present

## 2020-05-12 DIAGNOSIS — I482 Chronic atrial fibrillation, unspecified: Secondary | ICD-10-CM | POA: Diagnosis not present

## 2020-05-12 DIAGNOSIS — M25531 Pain in right wrist: Secondary | ICD-10-CM | POA: Diagnosis not present

## 2020-05-12 DIAGNOSIS — T502X5A Adverse effect of carbonic-anhydrase inhibitors, benzothiadiazides and other diuretics, initial encounter: Secondary | ICD-10-CM | POA: Diagnosis present

## 2020-05-12 DIAGNOSIS — N1832 Chronic kidney disease, stage 3b: Secondary | ICD-10-CM | POA: Diagnosis not present

## 2020-05-12 DIAGNOSIS — I130 Hypertensive heart and chronic kidney disease with heart failure and stage 1 through stage 4 chronic kidney disease, or unspecified chronic kidney disease: Secondary | ICD-10-CM | POA: Diagnosis present

## 2020-05-12 DIAGNOSIS — M7989 Other specified soft tissue disorders: Secondary | ICD-10-CM | POA: Diagnosis not present

## 2020-05-12 DIAGNOSIS — F32A Depression, unspecified: Secondary | ICD-10-CM | POA: Diagnosis present

## 2020-05-12 DIAGNOSIS — M25421 Effusion, right elbow: Secondary | ICD-10-CM | POA: Diagnosis not present

## 2020-05-12 DIAGNOSIS — Z20822 Contact with and (suspected) exposure to covid-19: Secondary | ICD-10-CM | POA: Diagnosis present

## 2020-05-12 MED ORDER — LIDOCAINE 5 % EX PTCH
1.0000 | MEDICATED_PATCH | CUTANEOUS | Status: DC
  Administered 2020-05-12: 1 via TRANSDERMAL
  Filled 2020-05-12: qty 1

## 2020-05-12 MED ORDER — LIDOCAINE 5 % EX PTCH: 1.0000 | MEDICATED_PATCH | Freq: Two times a day (BID) | CUTANEOUS | 0 refills | Status: DC

## 2020-05-12 NOTE — ED Notes (Signed)
Pt placed in room 42, NAD.

## 2020-05-12 NOTE — ED Triage Notes (Signed)
Pt states his dog ran into his RLE 2 days ago and now has a bruised, raised area that seems to be getting larger, half dollar sized area noted with no redness

## 2020-05-12 NOTE — Discharge Instructions (Signed)
Ultrasound reviewed did not show evidence of a DVT.  Findings consistent with a hematoma.  Follow discharge care instructions.  Follow-up with PCP in 4 weeks.  Return to ED if condition worsens.

## 2020-05-12 NOTE — ED Provider Notes (Signed)
[facility] Emergency Department Provider Note   ____________________________________________   Event Date/Time   First MD Initiated Contact with Patient 05/12/20 [phone]     (approximate)  I have reviewed the triage vital signs and the nursing notes.   HISTORY  Chief Complaint Leg Pain    HPI [name]. is a 60 y.o. male patient presents with right leg pain, edema,  and ecchymosis secondary to blunt trauma.  Patient states his dog ran into him 2 days ago.  Patient stated bruise is increasing in size.  Patient has history of A. fib and takes Eliquis.  Rates pain as a 7/10.  Described pain as "achy".  No palliative measures for complaint.  Denies dyspnea or chest pain.         Past Medical History:  Diagnosis Date  . Allergy    seasonal  . Amnestic MCI (mild cognitive impairment with memory loss)   . Asthma   . Benign neoplasm of colon   . CAD (coronary artery disease) 2005   a.  Status post four-vessel CABG in 2005; b. 03/2018 MV: Small, fixed inferoapical and apical septal defect. No ischemia. EF 47% (60-65% by 05/2018 Echo).  . Chronic atrial fibrillation ([place])    a. CHADS2VASc 4 (CHF, HTN, DM, vascular disease)--> Eliquis; b. 09/2018 Zio: Chronic Afib.  . Chronic combined systolic and diastolic CHF (congestive heart failure) ([place])    a.  7/18 Echo: EF 45-50%; b. 05/2018 Echo: EF 60-65%, RVSP 74.8; c. 12/2018 Echo: EF 50-55%. Sev dil LA.  Marland Kitchen Chronic venous stasis dermatitis of both lower extremities   . Cytomegaloviral disease ([place]) 2017  . Depression   . Diabetes mellitus   . Diverticulosis   . Dyspnea   . Erectile dysfunction   . ESRD (end stage renal disease) ([place]) 2005   a. HD 2004-2012; b. S/p renal transplant in 2012  . GERD (gastroesophageal reflux disease)   . Gout   . Hyperlipidemia    low since renal failure  . Hypertension   . Kidney transplant status, cadaveric 2012  . Obesity   . PAH (pulmonary artery hypertension)  ([place])    a. 05/2018 Echo: RVSP 74.4mHg; b. 12/2018 RV not well visualized.  . Pneumonia   . Purpura ([place]   . Sleep apnea    bipap with oxygen 2 l  . Trigger finger   . Ulcer 05/2016   Left shin  . Wears glasses     Patient Active Problem List   Diagnosis Date Noted  . Acute pain of left knee 03/07/2020  . Erectile dysfunction due to arterial insufficiency 10/20/2019  . Right ear pain 10/15/2019  . Chronic venous embolism and thrombosis of deep veins of upper extremity ([place] 07/23/2019  . Sleep disturbance 07/23/2019  . AV fistula occlusion ([place] 07/12/2019  . Permanent atrial fibrillation (HD[place]   . Atypical pneumonia 07/01/2019  . Synovitis of finger 06/11/2019  . Pulmonary hypertension due to left heart disease ([place] 01/20/2019  . Trigger finger, unspecified little finger 12/23/2018  . Superficial thrombophlebitis 12/23/2018  . Fever 11/10/2018  . SOB (shortness of breath) 11/06/2018  . CHF (congestive heart failure) ([place] 11/05/2018  . MDD (major depressive disorder), single episode, mild ([place] 04/29/2018  . Cervical sprain, initial encounter 03/02/2018  . Moderate persistent asthma 05/21/2017  . Cough 02/10/2017  . Chronic diastolic heart failure ([place] 11/19/2016  . Chronic venous insufficiency 06/05/2016  . Proteinuria 06/23/2015  . Hypokalemia 10/13/2014  . BPH with  obstruction/lower urinary tract symptoms 09/25/2014  . Hypogonadism in male 09/25/2014  . Immunosuppressed status ([place]) 09/09/2014  . Type 2 diabetes mellitus with hyperglycemia, with long-term current use of insulin ([name]) 09/09/2014  . Chronic atrial fibrillation ([place]) 07/18/2014  . DM eye manif type II 10/27/2013  . MDD (recurrent major depressive disorder) in remission ([place]) 10/04/2013  . Benign neoplasm of colon 04/22/2013  . Diverticulosis of colon (without mention of hemorrhage) 04/22/2013  . History of renal transplant 03/01/2013  . Asthma, persistent controlled 02/25/2013  . Erectile  dysfunction 10/20/2012  . Obstructive sleep apnea 09/29/2012  . Obesity hypoventilation syndrome ([place]) 03/31/2012  . Routine general medical examination at a health care facility 11/12/2011  . Type 2 diabetes, controlled, with renal manifestation ([place]) 03/01/2011  . Essential hypertension 12/17/2010  . Coronary artery disease involving native coronary artery of native heart without angina pectoris 12/17/2010  . Hyperlipidemia   . ESRD (end stage renal disease) [facility])     Past Surgical History:  Procedure Laterality Date  . BARIATRIC SURGERY    . CARDIAC CATHETERIZATION     [facility]  . CARDIAC CATHETERIZATION  05/03/2019  . CATARACT EXTRACTION W/PHACO Right 10/29/2017   Procedure: CATARACT EXTRACTION PHACO AND INTRAOCULAR LENS PLACEMENT ([place])  RIGHT DIABETIC;  Surgeon: [name], MD;  Location: [place];  Service: Ophthalmology;  Laterality: Right;  Diabetic - insulin  . CATARACT EXTRACTION W/PHACO Left 11/18/2017   Procedure: CATARACT EXTRACTION PHACO AND INTRAOCULAR LENS PLACEMENT ([place]) LEFT IVA/TOPICAL;  Surgeon: [name], MD;  Location: [place];  Service: Ophthalmology;  Laterality: Left;  DIABETES - insulin sleep apnea  . COLONOSCOPY WITH PROPOFOL N/A 04/22/2013   Procedure: COLONOSCOPY WITH PROPOFOL;  Surgeon: [name], MD;  Location: [facility];  Service: Endoscopy;  Laterality: N/A;  . CORONARY ARTERY BYPASS GRAFT  2005   X 4  . DG ANGIO AV SHUNT*L*     right and left upper arms  . FASCIOTOMY  03/03/2012   Procedure: FASCIOTOMY;  Surgeon: [name], MD;  Location: [place];  Service: Orthopedics;  Laterality: Right;  FASCIOTOMY RIGHT SMALL FINGER  . FASCIOTOMY Left 08/17/2013   Procedure: FASCIOTOMY LEFT RING;  Surgeon: [name], MD;  Location: [place];  Service: Orthopedics;  Laterality: Left;  . INCISION AND DRAINAGE ABSCESS Left 10/15/2015   Procedure: INCISION AND DRAINAGE ABSCESS;   Surgeon: [name], MD;  Location: [facility];  Service: General;  Laterality: Left;  . KIDNEY TRANSPLANT  09/13/2010   cadaver--at [facility]  . RIGHT HEART CATH N/A 11/15/2016   Procedure: RIGHT HEART CATH;  Surgeon: [name], MD;  Location: [place] [facility];  Service: Cardiovascular;  Laterality: N/A;  . RIGHT HEART CATH N/A 05/03/2019   Procedure: RIGHT HEART CATH;  Surgeon: [name], MD;  Location: [place] [facility];  Service: Cardiovascular;  Laterality: N/A;  . TYMPANIC MEMBRANE REPAIR  1/12   left  . VASECTOMY      Prior to Admission medications   Medication Sig Start Date End Date Taking? Authorizing Provider  lidocaine (LIDODERM) 5 % Place 1 patch onto the skin every 12 (twelve) hours. Remove & Discard patch within 12 hours or as directed by MD 05/12/20 05/12/21 Yes Sable Feil, PA-C  beclomethasone (QVAR) 80 MCG/ACT inhaler Inhale 2 puffs into the lungs 2 (two) times daily.  10/01/13   [provider]  buPROPion (WELLBUTRIN SR) 150 MG 12 hr tablet Take  150 mg by mouth 2 (two) times daily.    [provider]  colchicine 0.6 MG tablet Take 0.6 mg by mouth daily as needed. 10/21/19   [provider]  dapagliflozin propanediol (FARXIGA) 10 MG TABS tablet Take 1 tablet (10 mg total) by mouth daily before breakfast. 03/07/20   [name], MD  diclofenac Sodium (VOLTAREN) 1 % GEL Apply 2 g topically 3 (three) times daily as needed. 03/07/20   Pleas Koch, NP  ELIQUIS 5 MG TABS tablet TAKE 1 TABLET BY MOUTH TWICE DAILY 12/15/19   [name], MD  febuxostat (ULORIC) 40 MG tablet Take 40 mg by mouth daily. 01/31/20   [provider]  FERREX 150 150 MG capsule Take 150 mg by mouth daily.  09/23/19   [provider]  fluticasone (FLONASE) 50 MCG/ACT nasal spray PLACE 2 SPRAYS INTO BOTH NOSTRILS DAILY. 05/25/19   [name], MD  gabapentin (NEURONTIN) 300 MG capsule Take 300 mg by mouth 2 (two) times daily.  11/01/19   [provider]  HUMALOG KWIKPEN 100 UNIT/ML KwikPen Inject 5-10 Units into the skin 3 (three) times daily. Sliding scale 09/01/18   [provider]  insulin glargine (LANTUS) 100 unit/mL SOPN Inject 40 Units into the skin at bedtime.     [provider]  metolazone (ZAROXOLYN) 5 MG tablet Take 1 tablet (5 mg total) by mouth 2 (two) times a week. On tuesdays Sundays 03/09/20   [name], MD  mometasone (NASONEX) 50 MCG/ACT nasal spray Place 2 sprays into the nose daily as needed.    [provider]  montelukast (SINGULAIR) 10 MG tablet TAKE 1 TABLET BY MOUTH AT BEDTIME. 08/18/19   [name], MD  Multiple Vitamin (MULTIVITAMIN WITH MINERALS) TABS tablet Take 1 tablet by mouth daily.    [provider]  mycophenolate (MYFORTIC) 180 MG EC tablet Take 360 mg by mouth 2 (two) times daily.    [provider]  omeprazole (PRILOSEC) 20 MG capsule Take 1 capsule (20 mg total) by mouth daily. 10/05/19   [name], MD  potassium chloride (MICRO-K) 10 MEQ CR capsule Take 6 capsules (60 mEq total) by mouth 2 (two) times daily. Take an extra tab on Tue and Fri with metolazone 05/02/20   [name], MD  rosuvastatin (CRESTOR) 10 MG tablet TAKE 1 TABLET BY MOUTH DAILY 12/01/19   Loel Dubonnet, NP  tacrolimus (PROGRAF) 1 MG capsule 3 mg in AM and 31m in PM    [provider]  tadalafil, PAH, (ADCIRCA) 20 MG tablet Take 1 tablet (20 mg total) by mouth every other day. 01/28/20   AWellington Hampshire MD  tamsulosin (FLOMAX) 0.4 MG CAPS capsule Take 0.4 mg by mouth daily.     [provider]  torsemide (DEMADEX) 20 MG tablet Take 100 mg by mouth 2 (two) times daily.    [provider]  vitamin C (ASCORBIC ACID) 250 MG tablet Take 1 tablet (250 mg total) by mouth daily. 07/04/19   SMax Sane MD  zinc sulfate 220 (50 Zn) MG capsule Take 1 capsule (220 mg total) by mouth 2 (two) times daily. 07/04/19   SMax Sane MD    Allergies Contrast media [iodinated diagnostic agents] and Ibuprofen  Family History  Problem Relation Age of Onset  . Heart disease Father   . Kidney failure Father   . Kidney disease Father   . Diabetes Maternal Grandmother   .  Breast cancer Maternal Grandmother   . Valvular heart disease Mother   . Liver cancer Paternal Uncle   . Liver cancer Paternal Grandmother   . Prostate cancer Neg Hx     Social History Social History   Tobacco Use  . Smoking status: Former Smoker    Types: Cigars    Quit date: 09/02/1994    Years since quitting: 25.7  . Smokeless tobacco: Never Used  Vaping Use  . Vaping Use: Never used  Substance Use Topics  . Alcohol use: Yes    Alcohol/week: 0.0 - 1.0 standard drinks    Comment: occasional- 3 in the past year  . Drug use: No    Review of Systems Constitutional: No fever/chills Eyes: No visual changes. ENT: No sore throat. Cardiovascular: Denies chest pain. Respiratory: Denies shortness of breath. Gastrointestinal: No abdominal pain.  No nausea, no vomiting.  No diarrhea.  No constipation. Genitourinary: Negative for dysuria.  BPH Musculoskeletal: Right lower leg pain Skin: Negative for rash. Neurological: Negative for headaches, focal weakness or numbness. Psychiatric:  Depression Endocrine:  Diabetes, end-stage renal disease, hyperlipidemia, and hypertension. Allergic/Immunilogical: IVP dye and ibuprofen.  ____________________________________________   PHYSICAL EXAM:  VITAL SIGNS: ED Triage Vitals  Enc Vitals Group     BP 05/12/20 0747 (!) 185/80     Pulse Rate 05/12/20 0747 60     Resp 05/12/20 0747 20     Temp 05/12/20 0747 97.9 F (36.6 C)     Temp Source 05/12/20 0747 Oral     SpO2 --      Weight 05/12/20 0746 259 lb (117.5 kg)     Height 05/12/20 0746 _0  (1.778 m)     Head Circumference --      Peak Flow --      Pain Score 05/12/20 0746 7     Pain Loc --      Pain Edu? --      Excl. in [place]? --      Constitutional: Alert and oriented. Well appearing and in no acute distress. Eyes: Conjunctivae are normal. PERRL. EOMI. Head: Atraumatic. Nose: No congestion/rhinnorhea. Mouth/Throat: Mucous membranes are moist.  Oropharynx non-erythematous. Neck: No stridor.  No cervical spine tenderness to palpation. Hematological/Lymphatic/Immunilogical: No cervical lymphadenopathy. Cardiovascular: Normal rate, regular rhythm. Grossly normal heart sounds.  Good peripheral circulation.  Varicose veins.  Elevated blood pressure.Marland Kitchen Respiratory: Normal respiratory effort.  No retractions. Lungs CTAB. Gastrointestinal: Soft and nontender. No distention. No abdominal bruits. No CVA tenderness. Genitourinary: Deferred Musculoskeletal: No obvious deformity.  Moderate guarding palpation mid anterior lower leg.Marland Kitchen  No joint effusions. Neurologic:  Normal speech and language. No gross focal neurologic deficits are appreciated. No gait instability. Skin: Ecchymosis and hematoma mid tib-fib.   Psychiatric: Mood and affect are normal. Speech and behavior are normal.  ____________________________________________   LABS (all labs ordered are listed, but only abnormal results are displayed)  Labs Reviewed - No data to display ____________________________________________  EKG   ____________________________________________  RADIOLOGY I, Sable Feil, personally viewed and evaluated these images (plain radiographs) as part of my medical decision making, as well as reviewing the written report by the radiologist.  ED MD interpretation:   Official radiology report(s): US Venous Img Lower Unilateral Right  Result Date: 05/12/2020 EXAM: RIGHT LOWER EXTREMITY VENOUS DOPPLER ULTRASOUND TECHNIQUE: Gray-scale sonography with compression, as well as color and duplex ultrasound, were performed to evaluate the deep venous system(s) from the level of the common femoral vein through the popliteal and proximal  calf veins.  COMPARISON:  05/01/2019 FINDINGS: VENOUS Normal compressibility of the common femoral, superficial femoral, and popliteal veins, as well as the visualized calf veins. Visualized portions of profunda femoral vein and great saphenous vein unremarkable. No filling defects to suggest DVT on grayscale or color Doppler imaging. Doppler waveforms show normal direction of venous flow, normal respiratory plasticity and response to augmentation. Limited views of the contralateral common femoral vein are unremarkable. OTHER None. Limitations: none IMPRESSION: 1. No right lower extremity DVT. 2. Targeted sonographic evaluation of the area of concern in the right proximal medial shin demonstrates a heterogeneous hypoechoic structure measuring 2.5 x 1.5 x 0.7 cm. This is most likely due to a hematoma. A follow-up ultrasound should be performed in 4-6 weeks to document resolution. Electronically Signed   By: Miachel Roux M.D.   On: 05/12/2020 08:41    ____________________________________________   PROCEDURES  Procedure(s) performed (including Critical Care):  Procedures   ____________________________________________   INITIAL IMPRESSION / ASSESSMENT AND PLAN / ED COURSE  As part of my medical decision making, I reviewed the following data within the [place]         Patient presents with pain edema and ecchymosis to the right lower extremity secondary to blunt trauma.  Differentials the patient history consist of DVT or hematoma.  Discussed ultrasound findings with patient is negative for DVT.  Patient given discharge care instructions and advised to have a repeat ultrasound in 4 to 6 weeks.      ____________________________________________   FINAL CLINICAL IMPRESSION(S) / ED DIAGNOSES  Final diagnoses:  Hematoma of right lower leg     ED Discharge Orders         Ordered    lidocaine (LIDODERM) 5 %  Every 12 hours        05/12/20 0854          *Please note:  Carl  Jamil [name]. was evaluated in Emergency Department on 05/12/2020 for the symptoms described in the history of present illness. He was evaluated in the context of the global COVID-19 pandemic, which necessitated consideration that the patient might be at risk for infection with the SARS-CoV-2 virus that causes COVID-19. Institutional protocols and algorithms that pertain to the evaluation of patients at risk for COVID-19 are in a state of rapid change based on information released by regulatory bodies including the CDC and federal and state organizations. These policies and algorithms were followed during the patient's care in the ED.  Some ED evaluations and interventions may be delayed as a result of limited staffing during and the pandemic.*   Note:  This document was prepared using Dragon voice recognition software and may include unintentional dictation errors.    Sable Feil, PA-C 05/12/20 [id number]    Carrie Mew, MD 05/12/20 [phone]

## 2020-05-14 ENCOUNTER — Encounter: Payer: Self-pay | Admitting: Internal Medicine

## 2020-05-14 ENCOUNTER — Inpatient Hospital Stay: Payer: 59

## 2020-05-14 ENCOUNTER — Other Ambulatory Visit: Payer: Self-pay

## 2020-05-14 ENCOUNTER — Inpatient Hospital Stay
Admission: EM | Admit: 2020-05-14 | Discharge: 2020-05-17 | DRG: 603 | Disposition: A | Discharge status: Home / Self Care | Payer: 59 | Attending: Internal Medicine | Admitting: Internal Medicine

## 2020-05-14 DIAGNOSIS — T502X5A Adverse effect of carbonic-anhydrase inhibitors, benzothiadiazides and other diuretics, initial encounter: Secondary | ICD-10-CM | POA: Diagnosis present

## 2020-05-14 DIAGNOSIS — Z940 Kidney transplant status: Secondary | ICD-10-CM

## 2020-05-14 DIAGNOSIS — M25421 Effusion, right elbow: Secondary | ICD-10-CM | POA: Diagnosis not present

## 2020-05-14 DIAGNOSIS — F32A Depression, unspecified: Secondary | ICD-10-CM | POA: Diagnosis present

## 2020-05-14 DIAGNOSIS — W541XXA Struck by dog, initial encounter: Secondary | ICD-10-CM

## 2020-05-14 DIAGNOSIS — I2510 Atherosclerotic heart disease of native coronary artery without angina pectoris: Secondary | ICD-10-CM | POA: Diagnosis present

## 2020-05-14 DIAGNOSIS — Z87891 Personal history of nicotine dependence: Secondary | ICD-10-CM

## 2020-05-14 DIAGNOSIS — G4730 Sleep apnea, unspecified: Secondary | ICD-10-CM | POA: Diagnosis present

## 2020-05-14 DIAGNOSIS — I480 Paroxysmal atrial fibrillation: Secondary | ICD-10-CM | POA: Diagnosis present

## 2020-05-14 DIAGNOSIS — Z833 Family history of diabetes mellitus: Secondary | ICD-10-CM

## 2020-05-14 DIAGNOSIS — M25521 Pain in right elbow: Secondary | ICD-10-CM | POA: Diagnosis present

## 2020-05-14 DIAGNOSIS — J45909 Unspecified asthma, uncomplicated: Secondary | ICD-10-CM | POA: Diagnosis present

## 2020-05-14 DIAGNOSIS — K219 Gastro-esophageal reflux disease without esophagitis: Secondary | ICD-10-CM | POA: Diagnosis present

## 2020-05-14 DIAGNOSIS — N1832 Chronic kidney disease, stage 3b: Secondary | ICD-10-CM | POA: Diagnosis present

## 2020-05-14 DIAGNOSIS — Z9841 Cataract extraction status, right eye: Secondary | ICD-10-CM

## 2020-05-14 DIAGNOSIS — I272 Pulmonary hypertension, unspecified: Secondary | ICD-10-CM | POA: Diagnosis present

## 2020-05-14 DIAGNOSIS — I10 Essential (primary) hypertension: Secondary | ICD-10-CM | POA: Diagnosis not present

## 2020-05-14 DIAGNOSIS — S8011XA Contusion of right lower leg, initial encounter: Secondary | ICD-10-CM | POA: Diagnosis present

## 2020-05-14 DIAGNOSIS — I5032 Chronic diastolic (congestive) heart failure: Secondary | ICD-10-CM | POA: Diagnosis present

## 2020-05-14 DIAGNOSIS — M25539 Pain in unspecified wrist: Secondary | ICD-10-CM

## 2020-05-14 DIAGNOSIS — I5042 Chronic combined systolic (congestive) and diastolic (congestive) heart failure: Secondary | ICD-10-CM | POA: Diagnosis present

## 2020-05-14 DIAGNOSIS — Z91041 Radiographic dye allergy status: Secondary | ICD-10-CM

## 2020-05-14 DIAGNOSIS — M109 Gout, unspecified: Secondary | ICD-10-CM | POA: Diagnosis present

## 2020-05-14 DIAGNOSIS — I130 Hypertensive heart and chronic kidney disease with heart failure and stage 1 through stage 4 chronic kidney disease, or unspecified chronic kidney disease: Secondary | ICD-10-CM | POA: Diagnosis present

## 2020-05-14 DIAGNOSIS — M1000 Idiopathic gout, unspecified site: Secondary | ICD-10-CM

## 2020-05-14 DIAGNOSIS — N400 Enlarged prostate without lower urinary tract symptoms: Secondary | ICD-10-CM | POA: Diagnosis present

## 2020-05-14 DIAGNOSIS — E1122 Type 2 diabetes mellitus with diabetic chronic kidney disease: Secondary | ICD-10-CM | POA: Diagnosis present

## 2020-05-14 DIAGNOSIS — L0390 Cellulitis, unspecified: Secondary | ICD-10-CM | POA: Diagnosis present

## 2020-05-14 DIAGNOSIS — Z20822 Contact with and (suspected) exposure to covid-19: Secondary | ICD-10-CM | POA: Diagnosis present

## 2020-05-14 DIAGNOSIS — I872 Venous insufficiency (chronic) (peripheral): Secondary | ICD-10-CM | POA: Diagnosis present

## 2020-05-14 DIAGNOSIS — Z79899 Other long term (current) drug therapy: Secondary | ICD-10-CM

## 2020-05-14 DIAGNOSIS — L03113 Cellulitis of right upper limb: Secondary | ICD-10-CM | POA: Diagnosis present

## 2020-05-14 DIAGNOSIS — M7989 Other specified soft tissue disorders: Secondary | ICD-10-CM | POA: Diagnosis present

## 2020-05-14 DIAGNOSIS — M25529 Pain in unspecified elbow: Secondary | ICD-10-CM

## 2020-05-14 DIAGNOSIS — E876 Hypokalemia: Secondary | ICD-10-CM | POA: Diagnosis present

## 2020-05-14 DIAGNOSIS — I482 Chronic atrial fibrillation, unspecified: Secondary | ICD-10-CM | POA: Diagnosis present

## 2020-05-14 DIAGNOSIS — Z951 Presence of aortocoronary bypass graft: Secondary | ICD-10-CM

## 2020-05-14 DIAGNOSIS — M25531 Pain in right wrist: Secondary | ICD-10-CM | POA: Diagnosis present

## 2020-05-14 DIAGNOSIS — E1129 Type 2 diabetes mellitus with other diabetic kidney complication: Secondary | ICD-10-CM | POA: Diagnosis present

## 2020-05-14 DIAGNOSIS — R600 Localized edema: Secondary | ICD-10-CM | POA: Diagnosis not present

## 2020-05-14 DIAGNOSIS — Z961 Presence of intraocular lens: Secondary | ICD-10-CM | POA: Diagnosis present

## 2020-05-14 DIAGNOSIS — Z7951 Long term (current) use of inhaled steroids: Secondary | ICD-10-CM

## 2020-05-14 DIAGNOSIS — Z7901 Long term (current) use of anticoagulants: Secondary | ICD-10-CM

## 2020-05-14 DIAGNOSIS — I5022 Chronic systolic (congestive) heart failure: Secondary | ICD-10-CM | POA: Diagnosis present

## 2020-05-14 DIAGNOSIS — Z9852 Vasectomy status: Secondary | ICD-10-CM

## 2020-05-14 DIAGNOSIS — Z841 Family history of disorders of kidney and ureter: Secondary | ICD-10-CM

## 2020-05-14 DIAGNOSIS — Z794 Long term (current) use of insulin: Secondary | ICD-10-CM

## 2020-05-14 DIAGNOSIS — Z9981 Dependence on supplemental oxygen: Secondary | ICD-10-CM

## 2020-05-14 DIAGNOSIS — M1030 Gout due to renal impairment, unspecified site: Secondary | ICD-10-CM | POA: Diagnosis not present

## 2020-05-14 DIAGNOSIS — Z91048 Other nonmedicinal substance allergy status: Secondary | ICD-10-CM

## 2020-05-14 DIAGNOSIS — Z9842 Cataract extraction status, left eye: Secondary | ICD-10-CM

## 2020-05-14 LAB — CBC WITH DIFFERENTIAL/PLATELET
Abs Immature Granulocytes: 0.04 10*3/uL (ref 0.00–0.07)
Basophils Absolute: 0 10*3/uL (ref 0.0–0.1)
Basophils Relative: 0 %
Eosinophils Absolute: 0 10*3/uL (ref 0.0–0.5)
Eosinophils Relative: 0 %
HCT: 39 % (ref 39.0–52.0)
Hemoglobin: 12.1 g/dL — ABNORMAL LOW (ref 13.0–17.0)
Immature Granulocytes: 1 %
Lymphocytes Relative: 15 %
Lymphs Abs: 1.3 10*3/uL (ref 0.7–4.0)
MCH: 23.3 pg — ABNORMAL LOW (ref 26.0–34.0)
MCHC: 31 g/dL (ref 30.0–36.0)
MCV: 75.1 fL — ABNORMAL LOW (ref 80.0–100.0)
Monocytes Absolute: 0.7 10*3/uL (ref 0.1–1.0)
Monocytes Relative: 8 %
Neutro Abs: 6.7 10*3/uL (ref 1.7–7.7)
Neutrophils Relative %: 76 %
Platelets: 128 10*3/uL — ABNORMAL LOW (ref 150–400)
RBC: 5.19 MIL/uL (ref 4.22–5.81)
RDW: 17.8 % — ABNORMAL HIGH (ref 11.5–15.5)
WBC: 8.8 10*3/uL (ref 4.0–10.5)
nRBC: 0 % (ref 0.0–0.2)

## 2020-05-14 LAB — BASIC METABOLIC PANEL
Anion gap: 10 (ref 5–15)
BUN: 53 mg/dL — ABNORMAL HIGH (ref 6–20)
CO2: 29 mmol/L (ref 22–32)
Calcium: 8.6 mg/dL — ABNORMAL LOW (ref 8.9–10.3)
Chloride: 96 mmol/L — ABNORMAL LOW (ref 98–111)
Creatinine, Ser: 2.22 mg/dL — ABNORMAL HIGH (ref 0.61–1.24)
GFR, Estimated: 33 mL/min — ABNORMAL LOW (ref >60)
Glucose, Bld: 183 mg/dL — ABNORMAL HIGH (ref 70–99)
Potassium: 3.1 mmol/L — ABNORMAL LOW (ref 3.5–5.1)
Sodium: 135 mmol/L (ref 135–145)

## 2020-05-14 LAB — SARS CORONAVIRUS 2 (TAT 6-24 HRS): SARS Coronavirus 2: NEGATIVE

## 2020-05-14 LAB — GLUCOSE, CAPILLARY
Glucose-Capillary: 327 mg/dL — ABNORMAL HIGH (ref 70–99)
Glucose-Capillary: 369 mg/dL — ABNORMAL HIGH (ref 70–99)

## 2020-05-14 LAB — LACTIC ACID, PLASMA: Lactic Acid, Venous: 1.5 mmol/L (ref 0.5–1.9)

## 2020-05-14 LAB — SEDIMENTATION RATE: Sed Rate: 37 mm/hr — ABNORMAL HIGH (ref 0–20)

## 2020-05-14 LAB — HEMOGLOBIN A1C
Hgb A1c MFr Bld: 8.7 % — ABNORMAL HIGH (ref 4.8–5.6)
Mean Plasma Glucose: 202.99 mg/dL

## 2020-05-14 LAB — C-REACTIVE PROTEIN: CRP: 15.3 mg/dL — ABNORMAL HIGH (ref <1.0)

## 2020-05-14 LAB — CK: Total CK: 55 U/L (ref 49–397)

## 2020-05-14 LAB — URIC ACID: Uric Acid, Serum: 7.6 mg/dL (ref 3.7–8.6)

## 2020-05-14 LAB — MAGNESIUM: Magnesium: 1.7 mg/dL (ref 1.7–2.4)

## 2020-05-14 MED ORDER — TORSEMIDE 20 MG PO TABS
100.0000 mg | ORAL_TABLET | Freq: Two times a day (BID) | ORAL | Status: DC
  Administered 2020-05-14 – 2020-05-17 (×6): 100 mg via ORAL
  Filled 2020-05-14: qty 5
  Filled 2020-05-14: qty 1
  Filled 2020-05-14: qty 5
  Filled 2020-05-14 (×2): qty 1
  Filled 2020-05-14 (×3): qty 5

## 2020-05-14 MED ORDER — FLUTICASONE PROPIONATE 50 MCG/ACT NA SUSP
2.0000 | Freq: Every day | NASAL | Status: DC
  Administered 2020-05-15 – 2020-05-17 (×3): 2 via NASAL
  Filled 2020-05-14 (×2): qty 16

## 2020-05-14 MED ORDER — ZINC SULFATE 220 (50 ZN) MG PO CAPS
220.0000 mg | ORAL_CAPSULE | Freq: Two times a day (BID) | ORAL | Status: DC
  Administered 2020-05-14 – 2020-05-17 (×6): 220 mg via ORAL
  Filled 2020-05-14 (×6): qty 1

## 2020-05-14 MED ORDER — CEFAZOLIN SODIUM-DEXTROSE 2-4 GM/100ML-% IV SOLN
2.0000 g | Freq: Three times a day (TID) | INTRAVENOUS | Status: DC
  Administered 2020-05-14 – 2020-05-17 (×9): 2 g via INTRAVENOUS
  Filled 2020-05-14 (×15): qty 100

## 2020-05-14 MED ORDER — HYDROMORPHONE HCL 1 MG/ML IJ SOLN
1.0000 mg | Freq: Once | INTRAMUSCULAR | Status: AC
Start: 2020-05-14 — End: 2020-05-14
  Administered 2020-05-14: 1 mg via INTRAVENOUS
  Filled 2020-05-14: qty 1

## 2020-05-14 MED ORDER — METOLAZONE 5 MG PO TABS
5.0000 mg | ORAL_TABLET | ORAL | Status: DC
  Administered 2020-05-15: 09:00:00 5 mg via ORAL
  Filled 2020-05-14 (×2): qty 1

## 2020-05-14 MED ORDER — TACROLIMUS 1 MG PO CAPS
2.0000 mg | ORAL_CAPSULE | Freq: Every day | ORAL | Status: DC
  Administered 2020-05-14: 2 mg via ORAL
  Filled 2020-05-14 (×2): qty 2

## 2020-05-14 MED ORDER — LIDOCAINE-PRILOCAINE 2.5-2.5 % EX CREA
TOPICAL_CREAM | Freq: Once | CUTANEOUS | Status: AC
  Filled 2020-05-14: qty 5

## 2020-05-14 MED ORDER — PIPERACILLIN-TAZOBACTAM 3.375 G IVPB 30 MIN
3.3750 g | Freq: Once | INTRAVENOUS | Status: AC
  Administered 2020-05-14: 3.375 g via INTRAVENOUS
  Filled 2020-05-14: qty 50

## 2020-05-14 MED ORDER — BUDESONIDE 0.25 MG/2ML IN SUSP
0.2500 mg | Freq: Two times a day (BID) | RESPIRATORY_TRACT | Status: DC
  Filled 2020-05-14 (×2): qty 2

## 2020-05-14 MED ORDER — INSULIN GLARGINE 100 UNITS/ML SOLOSTAR PEN
40.0000 [IU] | PEN_INJECTOR | Freq: Every day | SUBCUTANEOUS | Status: DC
  Filled 2020-05-14: qty 3

## 2020-05-14 MED ORDER — COLCHICINE 0.6 MG PO TABS
0.6000 mg | ORAL_TABLET | Freq: Every day | ORAL | Status: DC
  Filled 2020-05-14: qty 1

## 2020-05-14 MED ORDER — LIDOCAINE 5 % EX PTCH
1.0000 | MEDICATED_PATCH | CUTANEOUS | Status: DC
  Administered 2020-05-14 – 2020-05-17 (×4): 1 via TRANSDERMAL
  Filled 2020-05-14 (×4): qty 1

## 2020-05-14 MED ORDER — INSULIN ASPART 100 UNIT/ML ~~LOC~~ SOLN
0.0000 [IU] | Freq: Three times a day (TID) | SUBCUTANEOUS | Status: DC
  Administered 2020-05-14: 11 [IU] via SUBCUTANEOUS
  Administered 2020-05-15: 8 [IU] via SUBCUTANEOUS
  Administered 2020-05-15: 5 [IU] via SUBCUTANEOUS
  Administered 2020-05-15: 15 [IU] via SUBCUTANEOUS
  Filled 2020-05-14 (×4): qty 1

## 2020-05-14 MED ORDER — INSULIN GLARGINE 100 UNIT/ML ~~LOC~~ SOLN
40.0000 [IU] | Freq: Every day | SUBCUTANEOUS | Status: DC
  Administered 2020-05-14 – 2020-05-15 (×2): 40 [IU] via SUBCUTANEOUS
  Filled 2020-05-14 (×2): qty 0.4

## 2020-05-14 MED ORDER — MYCOPHENOLATE SODIUM 180 MG PO TBEC
360.0000 mg | DELAYED_RELEASE_TABLET | Freq: Two times a day (BID) | ORAL | Status: DC
  Administered 2020-05-14 – 2020-05-17 (×6): 360 mg via ORAL
  Filled 2020-05-14 (×9): qty 2

## 2020-05-14 MED ORDER — POTASSIUM CHLORIDE CRYS ER 20 MEQ PO TBCR
40.0000 meq | EXTENDED_RELEASE_TABLET | Freq: Once | ORAL | Status: AC
  Administered 2020-05-14: 40 meq via ORAL
  Filled 2020-05-14: qty 2

## 2020-05-14 MED ORDER — FEBUXOSTAT 40 MG PO TABS
40.0000 mg | ORAL_TABLET | Freq: Every day | ORAL | Status: DC
Start: 2020-05-14 — End: 2020-05-17
  Administered 2020-05-15 – 2020-05-17 (×3): 40 mg via ORAL
  Filled 2020-05-14 (×5): qty 1

## 2020-05-14 MED ORDER — FENTANYL CITRATE (PF) 100 MCG/2ML IJ SOLN: 50.0000 ug | Freq: Once | INTRAMUSCULAR | Status: DC

## 2020-05-14 MED ORDER — SODIUM CHLORIDE 0.9 % IV SOLN
250.0000 mL | INTRAVENOUS | Status: DC | PRN
  Administered 2020-05-14 – 2020-05-15 (×2): 250 mL via INTRAVENOUS

## 2020-05-14 MED ORDER — MORPHINE SULFATE (PF) 4 MG/ML IV SOLN
4.0000 mg | Freq: Once | INTRAVENOUS | Status: AC
  Administered 2020-05-14: 4 mg via INTRAVENOUS
  Filled 2020-05-14: qty 1

## 2020-05-14 MED ORDER — POTASSIUM CHLORIDE CRYS ER 20 MEQ PO TBCR
30.0000 meq | EXTENDED_RELEASE_TABLET | Freq: Two times a day (BID) | ORAL | Status: DC
  Administered 2020-05-14 – 2020-05-16 (×5): 30 meq via ORAL
  Filled 2020-05-14 (×5): qty 1

## 2020-05-14 MED ORDER — APIXABAN 5 MG PO TABS
5.0000 mg | ORAL_TABLET | Freq: Two times a day (BID) | ORAL | Status: DC
  Administered 2020-05-14 – 2020-05-17 (×6): 5 mg via ORAL
  Filled 2020-05-14 (×6): qty 1

## 2020-05-14 MED ORDER — TACROLIMUS 1 MG PO CAPS: 1.0000 mg | ORAL_CAPSULE | Freq: Two times a day (BID) | ORAL | Status: DC

## 2020-05-14 MED ORDER — BECLOMETHASONE DIPROPIONATE 80 MCG/ACT IN AERS: 2.0000 | INHALATION_SPRAY | Freq: Two times a day (BID) | RESPIRATORY_TRACT | Status: DC

## 2020-05-14 MED ORDER — MAGNESIUM OXIDE 400 (241.3 MG) MG PO TABS
400.0000 mg | ORAL_TABLET | Freq: Every day | ORAL | Status: DC
  Administered 2020-05-15 – 2020-05-17 (×3): 400 mg via ORAL
  Filled 2020-05-14 (×3): qty 1

## 2020-05-14 MED ORDER — COLCHICINE 0.6 MG PO TABS
0.3000 mg | ORAL_TABLET | ORAL | Status: DC
Start: 2020-05-16 — End: 2020-05-17
  Administered 2020-05-16: 09:00:00 0.3 mg via ORAL
  Filled 2020-05-14: qty 0.5

## 2020-05-14 MED ORDER — COLCHICINE 0.6 MG PO TABS
1.2000 mg | ORAL_TABLET | Freq: Once | ORAL | Status: AC
  Administered 2020-05-14: 1.2 mg via ORAL
  Filled 2020-05-14: qty 2

## 2020-05-14 MED ORDER — VANCOMYCIN HCL IN DEXTROSE 1-5 GM/200ML-% IV SOLN
1000.0000 mg | Freq: Once | INTRAVENOUS | Status: AC
  Administered 2020-05-14: 1000 mg via INTRAVENOUS
  Filled 2020-05-14: qty 200

## 2020-05-14 MED ORDER — ONDANSETRON HCL 4 MG/2ML IJ SOLN: 4.0000 mg | Freq: Four times a day (QID) | INTRAMUSCULAR | Status: DC | PRN

## 2020-05-14 MED ORDER — ASCORBIC ACID 500 MG PO TABS
250.0000 mg | ORAL_TABLET | Freq: Every day | ORAL | Status: DC
  Administered 2020-05-15 – 2020-05-17 (×3): 250 mg via ORAL
  Filled 2020-05-14 (×3): qty 1

## 2020-05-14 MED ORDER — PREDNISONE 20 MG PO TABS
40.0000 mg | ORAL_TABLET | Freq: Every day | ORAL | Status: DC
  Administered 2020-05-15 – 2020-05-17 (×3): 40 mg via ORAL
  Filled 2020-05-14 (×3): qty 2

## 2020-05-14 MED ORDER — MONTELUKAST SODIUM 10 MG PO TABS
10.0000 mg | ORAL_TABLET | Freq: Every day | ORAL | Status: DC
  Administered 2020-05-14 – 2020-05-16 (×3): 10 mg via ORAL
  Filled 2020-05-14 (×3): qty 1

## 2020-05-14 MED ORDER — METHYLPREDNISOLONE SODIUM SUCC 125 MG IJ SOLR
INTRAMUSCULAR | Status: AC
  Filled 2020-05-14: qty 2

## 2020-05-14 MED ORDER — PANTOPRAZOLE SODIUM 40 MG PO TBEC
40.0000 mg | DELAYED_RELEASE_TABLET | Freq: Every day | ORAL | Status: DC
  Administered 2020-05-15 – 2020-05-17 (×3): 40 mg via ORAL
  Filled 2020-05-14 (×3): qty 1

## 2020-05-14 MED ORDER — TACROLIMUS 1 MG PO CAPS
3.0000 mg | ORAL_CAPSULE | Freq: Every morning | ORAL | Status: DC
  Administered 2020-05-15: 3 mg via ORAL
  Filled 2020-05-14 (×4): qty 3

## 2020-05-14 MED ORDER — ACETAMINOPHEN 650 MG RE SUPP: 650.0000 mg | Freq: Four times a day (QID) | RECTAL | Status: DC | PRN

## 2020-05-14 MED ORDER — ONDANSETRON HCL 4 MG/2ML IJ SOLN
4.0000 mg | Freq: Once | INTRAMUSCULAR | Status: AC
  Administered 2020-05-14: 4 mg via INTRAVENOUS
  Filled 2020-05-14: qty 2

## 2020-05-14 MED ORDER — TAMSULOSIN HCL 0.4 MG PO CAPS
0.4000 mg | ORAL_CAPSULE | Freq: Every day | ORAL | Status: DC
  Administered 2020-05-15 – 2020-05-17 (×3): 0.4 mg via ORAL
  Filled 2020-05-14 (×3): qty 1

## 2020-05-14 MED ORDER — SODIUM CHLORIDE 0.9% FLUSH
3.0000 mL | INTRAVENOUS | Status: DC | PRN
  Administered 2020-05-15 – 2020-05-16 (×2): 3 mL via INTRAVENOUS

## 2020-05-14 MED ORDER — POLYSACCHARIDE IRON COMPLEX 150 MG PO CAPS
150.0000 mg | ORAL_CAPSULE | Freq: Every day | ORAL | Status: DC
  Administered 2020-05-15 – 2020-05-17 (×3): 150 mg via ORAL
  Filled 2020-05-14 (×7): qty 1

## 2020-05-14 MED ORDER — SODIUM CHLORIDE 0.9% FLUSH
3.0000 mL | Freq: Two times a day (BID) | INTRAVENOUS | Status: DC
  Administered 2020-05-14 – 2020-05-17 (×5): 3 mL via INTRAVENOUS

## 2020-05-14 MED ORDER — HYDROCODONE-ACETAMINOPHEN 5-325 MG PO TABS
1.0000 | ORAL_TABLET | Freq: Four times a day (QID) | ORAL | Status: DC | PRN
Start: 2020-05-14 — End: 2020-05-17
  Administered 2020-05-14 – 2020-05-17 (×4): 1 via ORAL
  Filled 2020-05-14 (×4): qty 1

## 2020-05-14 MED ORDER — ROSUVASTATIN CALCIUM 10 MG PO TABS
10.0000 mg | ORAL_TABLET | Freq: Every day | ORAL | Status: DC
  Administered 2020-05-15 – 2020-05-17 (×3): 10 mg via ORAL
  Filled 2020-05-14 (×4): qty 1

## 2020-05-14 MED ORDER — GABAPENTIN 300 MG PO CAPS
300.0000 mg | ORAL_CAPSULE | Freq: Two times a day (BID) | ORAL | Status: DC
  Administered 2020-05-14 – 2020-05-17 (×6): 300 mg via ORAL
  Filled 2020-05-14 (×6): qty 1

## 2020-05-14 MED ORDER — BUPROPION HCL ER (SR) 150 MG PO TB12
150.0000 mg | ORAL_TABLET | Freq: Two times a day (BID) | ORAL | Status: DC
  Administered 2020-05-14 – 2020-05-17 (×6): 150 mg via ORAL
  Filled 2020-05-14 (×9): qty 1

## 2020-05-14 MED ORDER — ADULT MULTIVITAMIN W/MINERALS CH
1.0000 | ORAL_TABLET | Freq: Every day | ORAL | Status: DC
  Administered 2020-05-15 – 2020-05-17 (×3): 1 via ORAL
  Filled 2020-05-14 (×3): qty 1

## 2020-05-14 MED ORDER — ONDANSETRON HCL 4 MG PO TABS: 4.0000 mg | ORAL_TABLET | Freq: Four times a day (QID) | ORAL | Status: DC | PRN

## 2020-05-14 MED ORDER — ACETAMINOPHEN 325 MG PO TABS
650.0000 mg | ORAL_TABLET | Freq: Four times a day (QID) | ORAL | Status: DC | PRN
  Administered 2020-05-17: 650 mg via ORAL
  Filled 2020-05-14: qty 2

## 2020-05-14 MED ORDER — METHYLPREDNISOLONE SODIUM SUCC 125 MG IJ SOLR
60.0000 mg | Freq: Once | INTRAMUSCULAR | Status: AC
  Administered 2020-05-14: 60 mg via INTRAVENOUS

## 2020-05-14 NOTE — ED Triage Notes (Addendum)
Pt states severe right hand, wrist and elbow pain. Pt with history of gout, swelling noted to right elbow, wrist and hand. Pt states he has had a fever. Pt appears in distress. Extremity is warm to touch, pt screams when joints touched. Not able to take blood pressure in upper extremities due to pt pain and restrictions.

## 2020-05-14 NOTE — ED Notes (Signed)
Pt states he is 10-10 pain in his left wrist and left elbow. Upon assessment pt has swelling to the left elbow. Pt notes he has had a fever at home but isnt present at ER. Pt has bruising to both lower legs. Pt states he is on blood thinners.

## 2020-05-14 NOTE — Consult Note (Addendum)
ORTHOPAEDIC CONSULTATION  REQUESTING PHYSICIAN: Collier Bullock, MD  Chief Complaint: Right elbow and wrist pain  HPI: [name]. is a 60 y.o. male with a history of end-stage renal disease status post renal transplant with chronic suppressive medication, coronary artery disease status post CABG, atrial fibrillation on Eliquis, chronic venous stasis, diabetes mellitus and gout who presents to the ED today with the acute onset right elbow and wrist pain over the past 1 to 2 days.  Patient denies any recent injury or significant physical activity.  Patient states he has had a previous gout attack in his right elbow approximately 11 to 12 months ago.  Patient is on allopurinol.  In his hospital room this evening the patient explains that his right elbow and wrist symptoms have significantly improved since being admitted.  Patient is receiving Ancef, vancomycin and Zosyn and has been started on methylprednisolone.  Past Medical History:  Diagnosis Date  . Allergy    seasonal  . Amnestic MCI (mild cognitive impairment with memory loss)   . Asthma   . Benign neoplasm of colon   . CAD (coronary artery disease) 2005   a.  Status post four-vessel CABG in 2005; b. 03/2018 MV: Small, fixed inferoapical and apical septal defect. No ischemia. EF 47% (60-65% by 05/2018 Echo).  . Chronic atrial fibrillation ([place])    a. CHADS2VASc 4 (CHF, HTN, DM, vascular disease)--> Eliquis; b. 09/2018 Zio: Chronic Afib.  . Chronic combined systolic and diastolic CHF (congestive heart failure) ([place])    a.  7/18 Echo: EF 45-50%; b. 05/2018 Echo: EF 60-65%, RVSP 74.8; c. 12/2018 Echo: EF 50-55%. Sev dil LA.  Marland Kitchen Chronic venous stasis dermatitis of both lower extremities   . Cytomegaloviral disease ([place]) 2017  . Depression   . Diabetes mellitus   . Diverticulosis   . Dyspnea   . Erectile dysfunction   . ESRD (end stage renal disease) ([place]) 2005   a. HD 2004-2012; b. S/p renal transplant in 2012  . GERD  (gastroesophageal reflux disease)   . Gout   . Hyperlipidemia    low since renal failure  . Hypertension   . Kidney transplant status, cadaveric 2012  . Obesity   . PAH (pulmonary artery hypertension) ([place])    a. 05/2018 Echo: RVSP 74.73mHg; b. 12/2018 RV not well visualized.  . Pneumonia   . [place]   . Sleep apnea    bipap with oxygen 2 l  . Trigger finger   . Ulcer 05/2016   Left shin  . Wears glasses    Past Surgical History:  Procedure Laterality Date  . BARIATRIC SURGERY    . CARDIAC CATHETERIZATION     [facility]  . CARDIAC CATHETERIZATION  05/03/2019  . CATARACT EXTRACTION W/PHACO Right 10/29/2017   Procedure: CATARACT EXTRACTION PHACO AND INTRAOCULAR LENS PLACEMENT ([place]  RIGHT DIABETIC;  Surgeon: BLeandrew Koyanagi MD;  [facility] [facility]  Service: Ophthalmology;  Laterality: Right;  Diabetic - insulin  . CATARACT EXTRACTION W/PHACO Left 11/18/2017   Procedure: CATARACT EXTRACTION PHACO AND INTRAOCULAR LENS PLACEMENT ([place] IVA/TOPICAL;  Surgeon: BLeandrew Koyanagi MD;  [place] [facility]  Service: Ophthalmology;  Laterality: Left;  DIABETES - insulin sleep apnea  . COLONOSCOPY WITH PROPOFOL N/A 04/22/2013   Procedure: COLONOSCOPY WITH PROPOFOL;  Surgeon: DMilus Banister MD;  [facility];  Service: Endoscopy;  Laterality: N/A;  . CORONARY ARTERY BYPASS GRAFT  2005   X 4  . DG ANGIO AV SHUNT*L*  right and left upper arms  . FASCIOTOMY  03/03/2012   Procedure: FASCIOTOMY;  Surgeon: [name], MD;  Location: [facility];  Service: Orthopedics;  Laterality: Right;  FASCIOTOMY RIGHT SMALL FINGER  . FASCIOTOMY Left 08/17/2013   Procedure: FASCIOTOMY LEFT RING;  Surgeon: [name], MD;  Location: [facility];  Service: Orthopedics;  Laterality: Left;  . INCISION AND DRAINAGE ABSCESS Left 10/15/2015   Procedure: INCISION AND DRAINAGE ABSCESS;  Surgeon: [name], MD;  Location: [facility];   Service: General;  Laterality: Left;  . KIDNEY TRANSPLANT  09/13/2010   cadaver--at [facility]  . RIGHT HEART CATH N/A 11/15/2016   Procedure: RIGHT HEART CATH;  Surgeon: [name], MD;  Location: [facility];  Service: Cardiovascular;  Laterality: N/A;  . RIGHT HEART CATH N/A 05/03/2019   Procedure: RIGHT HEART CATH;  Surgeon: [name], MD;  Location: [facility];  Service: Cardiovascular;  Laterality: N/A;  . TYMPANIC MEMBRANE REPAIR  1/12   left  . VASECTOMY     Social History   Socioeconomic History  . Marital status: Married    Spouse name: Not on file  . Number of children: 2  . Years of education: Not on file  . Highest education level: Not on file  Occupational History  . Occupation: Warden/ranger: unemployed    Comment: disabled due to kidney failure  Tobacco Use  . Smoking status: Former Smoker    Types: Cigars    Quit date: 09/02/1994    Years since quitting: 25.7  . Smokeless tobacco: Never Used  Vaping Use  . Vaping Use: Never used  Substance and Sexual Activity  . Alcohol use: Yes    Alcohol/week: 0.0 - 1.0 standard drinks    Comment: occasional- 3 in the past year  . Drug use: No  . Sexual activity: Not on file  Other Topics Concern  . Not on file  Social History Narrative   In school for culinary arts      Has living will   Wife is health care POA   Would accept resuscitation attempts   Hasn't considered tube feedings   Social Determinants of Health   Financial Resource Strain: Not on file  Food Insecurity: Not on file  Transportation Needs: Not on file  Physical Activity: Not on file  Stress: Not on file  Social Connections: Not on file   Family History  Problem Relation Age of Onset  . Heart disease Father   . Kidney failure Father   . Kidney disease Father   . Diabetes Maternal Grandmother   . Breast cancer Maternal Grandmother   . Valvular heart disease Mother   . Liver cancer  Paternal Uncle   . Liver cancer Paternal Grandmother   . Prostate cancer Neg Hx    Allergies  Allergen Reactions  . Contrast Media [Iodinated Diagnostic Agents]     Kidney transplant  . Ibuprofen Other (See Comments)    Due to kidney transplant Due to kidney transplant Due to kidney transplant   Prior to Admission medications   Medication Sig Start Date End Date Taking? Authorizing Provider  beclomethasone (QVAR) 80 MCG/ACT inhaler Inhale 2 puffs into the lungs 2 (two) times daily.  10/01/13  Yes [provider]  colchicine 0.6 MG tablet Take 0.6 mg by mouth daily as needed. 10/21/19  Yes [provider]  dapagliflozin propanediol (FARXIGA) 10 MG TABS tablet  Take 1 tablet (10 mg total) by mouth daily before breakfast. 03/07/20  Yes [name], MD  ELIQUIS 5 MG TABS tablet TAKE 1 TABLET BY MOUTH TWICE DAILY 12/15/19  Yes [name], MD  febuxostat (ULORIC) 40 MG tablet Take 40 mg by mouth daily. 01/31/20  Yes [provider]  FERREX 150 150 MG capsule Take 150 mg by mouth daily.  09/23/19  Yes [provider]  fluticasone (FLONASE) 50 MCG/ACT nasal spray PLACE 2 SPRAYS INTO BOTH NOSTRILS DAILY. 05/25/19  Yes [name], MD  gabapentin (NEURONTIN) 300 MG capsule Take 300 mg by mouth 2 (two) times daily. 11/01/19  Yes [provider]  HUMALOG KWIKPEN 100 UNIT/ML KwikPen Inject 5-10 Units into the skin 3 (three) times daily. Sliding scale 09/01/18  Yes [provider]  insulin glargine (LANTUS) 100 unit/mL SOPN Inject 40 Units into the skin at bedtime.    Yes [provider]  lidocaine (LIDODERM) 5 % Place 1 patch onto the skin every 12 (twelve) hours. Remove & Discard patch within 12 hours or as directed by MD 05/12/20 05/12/21 Yes Sable Feil, PA-C  metolazone (ZAROXOLYN) 5 MG tablet Take 1 tablet (5 mg total) by mouth 2 (two) times a week. On tuesdays Sundays 03/09/20  Yes [name], MD  mometasone (NASONEX) 50  MCG/ACT nasal spray Place 2 sprays into the nose daily as needed.   Yes [provider]  montelukast (SINGULAIR) 10 MG tablet TAKE 1 TABLET BY MOUTH AT BEDTIME. 08/18/19  Yes [name], MD  Multiple Vitamin (MULTIVITAMIN WITH MINERALS) TABS tablet Take 1 tablet by mouth daily.   Yes [provider]  mycophenolate (MYFORTIC) 180 MG EC tablet Take 360 mg by mouth 2 (two) times daily.   Yes [provider]  omeprazole (PRILOSEC) 20 MG capsule Take 1 capsule (20 mg total) by mouth daily. 10/05/19  Yes [name], MD  potassium chloride (MICRO-K) 10 MEQ CR capsule Take 6 capsules (60 mEq total) by mouth 2 (two) times daily. Take an extra tab on Tue and Fri with metolazone 05/02/20  Yes [name], MD  rosuvastatin (CRESTOR) 10 MG tablet TAKE 1 TABLET BY MOUTH DAILY 12/01/19  Yes Loel Dubonnet, NP  tacrolimus (PROGRAF) 1 MG capsule 3 mg in AM and 75m in PM   Yes [provider]  tamsulosin (FLOMAX) 0.4 MG CAPS capsule Take 0.4 mg by mouth daily.    Yes [provider]  torsemide (DEMADEX) 20 MG tablet Take 100 mg by mouth 2 (two) times daily.   Yes [provider]  vitamin C (ASCORBIC ACID) 250 MG tablet Take 1 tablet (250 mg total) by mouth daily. 07/04/19  Yes SMax Sane MD  diclofenac Sodium (VOLTAREN) 1 % GEL Apply 2 g topically 3 (three) times daily as needed. 03/07/20   CPleas Koch NP  tadalafil, PAH, (ADCIRCA) 20 MG tablet Take 1 tablet (20 mg total) by mouth every other day. 01/28/20   AWellington Hampshire MD  zinc sulfate 220 (50 Zn) MG capsule Take 1 capsule (220 mg total) by mouth 2 (two) times daily. 07/04/19   SMax Sane MD   DG ELBOW COMPLETE RIGHT (3+VIEW)  Result Date: 05/14/2020 CLINICAL DATA:  Severe right elbow pain. History of gout. No known injury. EXAM: RIGHT ELBOW - COMPLETE 3+ VIEW COMPARISON:  None. FINDINGS: No acute bony or joint abnormality. No focal lesion. Small to moderate elbow joint effusion.  Vascular  stents noted. IMPRESSION: Small to moderate elbow joint effusion. The exam is otherwise negative. Electronically Signed   By: Inge Rise M.D.   On: 05/14/2020 16:31   DG Wrist Complete Right  Result Date: 05/14/2020 CLINICAL DATA:  60 year old male with right wrist pain. EXAM: RIGHT WRIST - COMPLETE 3+ VIEW COMPARISON:  None. FINDINGS: There is no acute fracture or dislocation. The bones are mildly osteopenic. No significant arthritic changes. Mild soft tissue swelling. Vascular calcifications noted. IMPRESSION: No acute fracture or dislocation. Electronically Signed   By: Anner Crete M.D.   On: 05/14/2020 16:31    Positive ROS: All other systems have been reviewed and were otherwise negative with the exception of those mentioned in the HPI and as above.  Physical Exam: General: Alert, no acute distress  MUSCULOSKELETAL: Right upper extremity: Patient has mild diffuse swelling extending from the wrist into the forearm and elbow.  Patient has minimal tenderness of the right wrist.  He has mild pain with flexion extension of the right wrist and has limited forearm rotation due to pain.  His arm compartments are soft and compressible.  There is no area of fluctuance.  His right elbow he can extend to approximately -40 degrees and can flex to approximately 120 degrees with moderate pain at the end ranges of motion.  He has tenderness to palpation of the right elbow.  A Band-Aid was placed over the lateral right elbow and appears to be a site of attempted joint aspiration.  Patient states that no fluid was obtained for sample.  Assessment: Right elbow, forearm and wrist pain and swelling secondary to cellulitis versus gout flare  Plan: The patient does not seem to have pain with motion that suggests a septic elbow or wrist joint at this time.  He has mild swelling and erythema which may indicate cellulitis of the forearm.  More likely however, given his history is that he has a gout  flare.  The patient has had gout in the right elbow in the past.  I recommend continued IV antibiotics as ordered overnight.  Continue steroid treatment for possible gout flare.  I will reassess the patient in the morning.  No surgery is Administrator, arts.  If the patient continues to have pain aspiration of the right elbow in interventional radiology would be considered.    Thornton Park, MD    05/14/2020 8:08 PM

## 2020-05-14 NOTE — Consult Note (Signed)
Pt transferred from ED at 1640

## 2020-05-14 NOTE — ED Notes (Signed)
Pt placed on nasal cannula while resting. Pt reports having to wear CPAP due to sleep apnea at home.

## 2020-05-14 NOTE — H&P (Signed)
History and Physical    [name]. MWU:[id number] DOB: 1960/09/18 DOA: 05/14/2020  PCP: [name], MD   Patient coming from: Home  I have personally briefly reviewed patient's old medical records in [place]  Chief Complaint: Pain in the right elbow and wrist  HPI: [name]. is a 60 y.o. male with medical history significant for end-stage renal disease status post renal transplant, coronary artery disease status post CABG, history of A. fib on chronic anticoagulation therapy, chronic venous stasis involving both lower extremities, diabetes mellitus, GERD and sleep apnea who presents to the ER for evaluation of pain involving his right elbow, wrist and hand.  He rates his pain a 10 x 10 in intensity at its worst and any form of movement or touch worsens his pain.  He does have a history of gout and has had flareups in the past but none as bad as today.  Extremity is warm to touch with streaking over his right forearm. He has had subjective fever but denies having any chills.  Denies having any trauma. He denies having any chest pain, no shortness of breath, no nausea, no vomiting, no diaphoresis, no palpitations, no dizziness, no lightheadedness, no cough, no sore throat, no myalgias, no urinary frequency, no nocturia, no dysuria, no abdominal pain, no diarrhea, no constipation, no weakness or focal deficits. Labs show sodium 135, potassium 3.1, chloride 96, bicarb 29, glucose 183, BUN 53, creatinine 2.2, calcium 8.6, magnesium 1.7, uric acid 7.6, total CK 55, lactic acid 1.5, white count 8.8, hemoglobin 12.1, hematocrit 39, MCV 75.1, RDW 17.8, platelet count 128    ED Course: Patient is a 60 year old male with multiple medical problems who presents to the ER via EMS for evaluation of severe pain involving the right elbow, right wrist and hand.  Arm is warm to touch and patient was noted to have streaking over the right forearm concerning for possible  cellulitis.  He received a dose of Zosyn in the ER and admission was requested for concerns of possible right arm cellulitis.    Review of Systems: As per HPI otherwise all other systems reviewed and negative.    Past Medical History:  Diagnosis Date  . Allergy    seasonal  . Amnestic MCI (mild cognitive impairment with memory loss)   . Asthma   . Benign neoplasm of colon   . CAD (coronary artery disease) 2005   a.  Status post four-vessel CABG in 2005; b. 03/2018 MV: Small, fixed inferoapical and apical septal defect. No ischemia. EF 47% (60-65% by 05/2018 Echo).  . Chronic atrial fibrillation ([place])    a. CHADS2VASc 4 (CHF, HTN, DM, vascular disease)--> Eliquis; b. 09/2018 Zio: Chronic Afib.  . Chronic combined systolic and diastolic CHF (congestive heart failure) ([place])    a.  7/18 Echo: EF 45-50%; b. 05/2018 Echo: EF 60-65%, RVSP 74.8; c. 12/2018 Echo: EF 50-55%. Sev dil LA.  Marland Kitchen Chronic venous stasis dermatitis of both lower extremities   . Cytomegaloviral disease ([place]) 2017  . Depression   . Diabetes mellitus   . Diverticulosis   . Dyspnea   . Erectile dysfunction   . ESRD (end stage renal disease) ([place]) 2005   a. HD 2004-2012; b. S/p renal transplant in 2012  . GERD (gastroesophageal reflux disease)   . Gout   . Hyperlipidemia    low since renal failure  . Hypertension   . Kidney transplant status, cadaveric 2012  . Obesity   .  PAH (pulmonary artery hypertension) ([place])    a. 05/2018 Echo: RVSP 74.11mHg; b. 12/2018 RV not well visualized.  . Pneumonia   . Purpura ([place]   . Sleep apnea    bipap with oxygen 2 l  . Trigger finger   . Ulcer 05/2016   Left shin  . Wears glasses     Past Surgical History:  Procedure Laterality Date  . BARIATRIC SURGERY    . CARDIAC CATHETERIZATION     [facility]  . CARDIAC CATHETERIZATION  05/03/2019  . CATARACT EXTRACTION W/PHACO Right 10/29/2017   Procedure: CATARACT EXTRACTION PHACO AND INTRAOCULAR LENS PLACEMENT ([place]  RIGHT DIABETIC;   Surgeon: BLeandrew Koyanagi MD;  [facility]  Service: Ophthalmology;  Laterality: Right;  Diabetic - insulin  . CATARACT EXTRACTION W/PHACO Left 11/18/2017   Procedure: CATARACT EXTRACTION PHACO AND INTRAOCULAR LENS PLACEMENT ([place]/TOPICAL;  Surgeon: BLeandrew Koyanagi MD;  [facility]  Service: Ophthalmology;  Laterality: Left;  DIABETES - insulin sleep apnea  . COLONOSCOPY WITH PROPOFOL N/A 04/22/2013   Procedure: COLONOSCOPY WITH PROPOFOL;  Surgeon: DMilus Banister MD;  [facility];  Service: Endoscopy;  Laterality: N/A;  . CORONARY ARTERY BYPASS GRAFT  2005   X 4  . DG ANGIO AV SHUNT*L*     right and left upper arms  . FASCIOTOMY  03/03/2012   Procedure: FASCIOTOMY;  Surgeon: GWynonia Sours MD;  [facility]  Service: Orthopedics;  Laterality: Right;  FASCIOTOMY RIGHT SMALL FINGER  . FASCIOTOMY Left 08/17/2013   Procedure: FASCIOTOMY LEFT RING;  Surgeon: GWynonia Sours MD;  [facility]  Service: Orthopedics;  Laterality: Left;  . INCISION AND DRAINAGE ABSCESS Left 10/15/2015   Procedure: INCISION AND DRAINAGE ABSCESS;  Surgeon: DJules Husbands MD;  Location: [facility];  Service: General;  Laterality: Left;  . KIDNEY TRANSPLANT  09/13/2010   cadaver--at [facility]  . RIGHT HEART CATH N/A 11/15/2016   Procedure: RIGHT HEART CATH;  Surgeon: AWellington Hampshire MD;  [facility];  Service: Cardiovascular;  Laterality: N/A;  . RIGHT HEART CATH N/A 05/03/2019   Procedure: RIGHT HEART CATH;  Surgeon: MLarey Dresser MD;  [facility];  Service: Cardiovascular;  Laterality: N/A;  . TYMPANIC MEMBRANE REPAIR  1/12   left  . VASECTOMY       reports that he quit smoking about 25 years ago. His smoking use included cigars. He has never used smokeless tobacco. He reports current alcohol use. He reports that he does not use drugs.  Allergies  Allergen Reactions  .  Contrast Media [Iodinated Diagnostic Agents]     Kidney transplant  . Ibuprofen Other (See Comments)    Due to kidney transplant Due to kidney transplant Due to kidney transplant    Family History  Problem Relation Age of Onset  . Heart disease Father   . Kidney failure Father   . Kidney disease Father   . Diabetes Maternal Grandmother   . Breast cancer Maternal Grandmother   . Valvular heart disease Mother   . Liver cancer Paternal Uncle   . Liver cancer Paternal Grandmother   . Prostate cancer Neg Hx       Prior to Admission medications   Medication Sig Start Date End Date Taking? Authorizing Provider  beclomethasone (QVAR) 80 MCG/ACT inhaler Inhale 2 puffs into the lungs 2 (two) times daily.  10/01/13  Yes [provider]  colchicine  0.6 MG tablet Take 0.6 mg by mouth daily as needed. 10/21/19  Yes [provider]  dapagliflozin propanediol (FARXIGA) 10 MG TABS tablet Take 1 tablet (10 mg total) by mouth daily before breakfast. 03/07/20  Yes [name], MD  ELIQUIS 5 MG TABS tablet TAKE 1 TABLET BY MOUTH TWICE DAILY 12/15/19  Yes [name], MD  febuxostat (ULORIC) 40 MG tablet Take 40 mg by mouth daily. 01/31/20  Yes [provider]  FERREX 150 150 MG capsule Take 150 mg by mouth daily.  09/23/19  Yes [provider]  fluticasone (FLONASE) 50 MCG/ACT nasal spray PLACE 2 SPRAYS INTO BOTH NOSTRILS DAILY. 05/25/19  Yes [name], MD  gabapentin (NEURONTIN) 300 MG capsule Take 300 mg by mouth 2 (two) times daily. 11/01/19  Yes [provider]  HUMALOG KWIKPEN 100 UNIT/ML KwikPen Inject 5-10 Units into the skin 3 (three) times daily. Sliding scale 09/01/18  Yes [provider]  insulin glargine (LANTUS) 100 unit/mL SOPN Inject 40 Units into the skin at bedtime.    Yes [provider]  lidocaine (LIDODERM) 5 % Place 1 patch onto the skin every 12 (twelve) hours. Remove & Discard patch within 12 hours or as  directed by MD 05/12/20 05/12/21 Yes Sable Feil, PA-C  metolazone (ZAROXOLYN) 5 MG tablet Take 1 tablet (5 mg total) by mouth 2 (two) times a week. On tuesdays Sundays 03/09/20  Yes [name], MD  mometasone (NASONEX) 50 MCG/ACT nasal spray Place 2 sprays into the nose daily as needed.   Yes [provider]  montelukast (SINGULAIR) 10 MG tablet TAKE 1 TABLET BY MOUTH AT BEDTIME. 08/18/19  Yes [name], MD  Multiple Vitamin (MULTIVITAMIN WITH MINERALS) TABS tablet Take 1 tablet by mouth daily.   Yes [provider]  mycophenolate (MYFORTIC) 180 MG EC tablet Take 360 mg by mouth 2 (two) times daily.   Yes [provider]  omeprazole (PRILOSEC) 20 MG capsule Take 1 capsule (20 mg total) by mouth daily. 10/05/19  Yes [name], MD  potassium chloride (MICRO-K) 10 MEQ CR capsule Take 6 capsules (60 mEq total) by mouth 2 (two) times daily. Take an extra tab on Tue and Fri with metolazone 05/02/20  Yes [name], MD  rosuvastatin (CRESTOR) 10 MG tablet TAKE 1 TABLET BY MOUTH DAILY 12/01/19  Yes Loel Dubonnet, NP  tacrolimus (PROGRAF) 1 MG capsule 3 mg in AM and 75m in PM   Yes [provider]  tamsulosin (FLOMAX) 0.4 MG CAPS capsule Take 0.4 mg by mouth daily.    Yes [provider]  torsemide (DEMADEX) 20 MG tablet Take 100 mg by mouth 2 (two) times daily.   Yes [provider]  vitamin C (ASCORBIC ACID) 250 MG tablet Take 1 tablet (250 mg total) by mouth daily. 07/04/19  Yes SMax Sane MD  diclofenac Sodium (VOLTAREN) 1 % GEL Apply 2 g topically 3 (three) times daily as needed. 03/07/20   CPleas Koch NP  tadalafil, PAH, (ADCIRCA) 20 MG tablet Take 1 tablet (20 mg total) by mouth every other day. 01/28/20   AWellington Hampshire MD  zinc sulfate 220 (50 Zn) MG capsule Take 1 capsule (220 mg total) by mouth 2 (two) times daily. 07/04/19   SMax Sane MD    Physical Exam: Vitals:   05/14/20 0830 05/14/20 0900  05/14/20 0930 05/14/20 0945  BP: 113/72 128/73 120/76 119/73  Pulse: 63 75  68 (!) 36  Resp:    19  Temp:      TempSrc:      SpO2: 100% 99% 99% 98%  Weight:      Height:         Vitals:   05/14/20 0830 05/14/20 0900 05/14/20 0930 05/14/20 0945  BP: 113/72 128/73 120/76 119/73  Pulse: 63 75 68 (!) 36  Resp:    19  Temp:      TempSrc:      SpO2: 100% 99% 99% 98%  Weight:      Height:          Constitutional: Alert and oriented x 3 .  Appears uncomfortable and in painful distress  HEENT:      Head: Normocephalic and atraumatic.         Eyes: PERLA, EOMI, Conjunctivae are normal. Sclera is non-icteric.       Mouth/Throat: Mucous membranes are moist.       Neck: Supple with no signs of meningismus. Cardiovascular: Regular rate and rhythm. No murmurs, gallops, or rubs. 2+ symmetrical distal pulses are present . No JVD. No LE edema Respiratory: Respiratory effort normal .Lungs sounds clear bilaterally. No wheezes, crackles, or rhonchi.  Gastrointestinal: Soft, non tender, and non distended with positive bowel sounds.  Central adiposity Genitourinary: No CVA tenderness. Musculoskeletal: Swelling involving the right elbow and wrist.  Tender to palpation with differential warmth and streaking over the right forearm Neurologic:  Face is symmetric. Moving all extremities. No gross focal neurologic deficits  Skin: Skin is warm, dry.  No rash or ulcers.  Chronic hyperpigmentation involving both lower extremities (Lt > Rt) Psychiatric: Mood and affect are normal   Labs on Admission: I have personally reviewed following labs and imaging studies  CBC: Recent Labs  Lab 05/14/20 0550  WBC 8.8  NEUTROABS 6.7  HGB 12.1*  HCT 39.0  MCV 75.1*  PLT 893*   Basic Metabolic Panel: Recent Labs  Lab 05/14/20 0550  NA 135  K 3.1*  CL 96*  CO2 29  GLUCOSE 183*  BUN 53*  CREATININE 2.22*  CALCIUM 8.6*  MG 1.7   GFR: Estimated Creatinine Clearance: 45.5 mL/min (A) (by C-G formula  based on SCr of 2.22 mg/dL (H)). Liver Function Tests: No results for input(s): AST, ALT, ALKPHOS, BILITOT, PROT, ALBUMIN in the last 168 hours. No results for input(s): LIPASE, AMYLASE in the last 168 hours. No results for input(s): AMMONIA in the last 168 hours. Coagulation Profile: No results for input(s): INR, PROTIME in the last 168 hours. Cardiac Enzymes: Recent Labs  Lab 05/14/20 0550  CKTOTAL 55   BNP (last 3 results) No results for input(s): PROBNP in the last 8760 hours. HbA1C: No results for input(s): HGBA1C in the last 72 hours. CBG: No results for input(s): GLUCAP in the last 168 hours. Lipid Profile: No results for input(s): CHOL, HDL, LDLCALC, TRIG, CHOLHDL, LDLDIRECT in the last 72 hours. Thyroid Function Tests: No results for input(s): TSH, T4TOTAL, FREET4, T3FREE, THYROIDAB in the last 72 hours. Anemia Panel: No results for input(s): VITAMINB12, FOLATE, FERRITIN, TIBC, IRON, RETICCTPCT in the last 72 hours. Urine analysis:    Component Value Date/Time   COLORURINE YELLOW (A) 08/04/2019 0814   APPEARANCEUR CLEAR (A) 08/04/2019 0814   APPEARANCEUR Clear 09/23/2014 0855   LABSPEC 1.010 08/04/2019 0814   PHURINE 5.0 08/04/2019 0814   GLUCOSEU 150 (A) 08/04/2019 0814   HGBUR NEGATIVE 08/04/2019 [place] 08/04/2019 8101  BILIRUBINUR Negative 09/23/2014 [place] 08/04/2019 0814   PROTEINUR NEGATIVE 08/04/2019 0814   NITRITE NEGATIVE 08/04/2019 [place] 08/04/2019 0814    Radiological Exams on Admission: No results found.   Assessment/Plan Principal Problem:   Gout attack Active Problems:   Type 2 diabetes, controlled, with renal manifestation (HCC)   Chronic atrial fibrillation (HCC)   Essential hypertension   Hypokalemia   Chronic venous insufficiency   Chronic diastolic heart failure (HCC)   Cellulitis      Acute gout flare rule out right arm cellulitis Patient with a history of gout  who presents to the ER for evaluation of severe pain involving the right elbow, right wrist and hand. Patient noted to have differential warmth involving the right forearm and streaking over the right forearm concerning for possible cellulitis We will place patient on systemic steroids for pain control Empiric antibiotic therapy with Ancef for presumed cellulitis Follow-up results of blood cultures Continue colchicine and Uloric    Chronic atrial fibrillation Continue apixaban as primary prophylaxis for an acute stroke    Diabetes mellitus with complications of end-stage renal disease status post renal transplant Maintain consistent carbohydrate diet Continue long-acting insulin Sliding scale coverage with Accu-Cheks before meals and at bedtime Continue on a suppressive therapy with Prograf and mycophenolate   Chronic diastolic dysfunction CHF Continue diuretic therapy with metolazone and torsemide    Depression Continue Wellbutrin   Hypokalemia/hypomagnesemia Related to diuretic therapy Supplement potassium and magnesium   BPH Continue Flomax  DVT prophylaxis: Apixaban Code Status: full code Family Communication: Greater than 50% of time was spent discussing patient's condition and plan of care with him and his wife at the bedside.  All questions and concerns have been addressed.  He verbalizes understanding and agrees with the plan. Disposition Plan: Back to previous home environment Consults called: Orthopedic surgery/nephrology Status: Inpatient.  The medical decision making for this patient was of high complexity and patient is at high risk for clinical deterioration during his hospitalization.    Collier Bullock MD [facility]     05/14/2020, 10:42 AM

## 2020-05-14 NOTE — ED Provider Notes (Signed)
[facility] Emergency Department Provider Note  ____________________________________________  Time seen: Approximately 7:46 AM  I have reviewed the triage vital signs and the nursing notes.   HISTORY  Chief Complaint Joint Pain   HPI [name]. is a 60 y.o. male the history of immune suppression from a renal transplant who presents for evaluation of right elbow and wrist pain.  Patient reports a history of gout.  Has had swelling at this location before but however the pain is usually well controlled with Tylenol.  Patient is complaining of severe pain and inability to move his elbow due to the severity of the pain.  According to the wife had a fever yesterday.  Patient denies any IV usage on that arm.  He does have an old fistula which is clotted and has been used in a while located in that arm as well.   Past Medical History:  Diagnosis Date  . Allergy    seasonal  . Amnestic MCI (mild cognitive impairment with memory loss)   . Asthma   . Benign neoplasm of colon   . CAD (coronary artery disease) 2005   a.  Status post four-vessel CABG in 2005; b. 03/2018 MV: Small, fixed inferoapical and apical septal defect. No ischemia. EF 47% (60-65% by 05/2018 Echo).  . Chronic atrial fibrillation ([place])    a. CHADS2VASc 4 (CHF, HTN, DM, vascular disease)--> Eliquis; b. 09/2018 Zio: Chronic Afib.  . Chronic combined systolic and diastolic CHF (congestive heart failure) ([place])    a.  7/18 Echo: EF 45-50%; b. 05/2018 Echo: EF 60-65%, RVSP 74.8; c. 12/2018 Echo: EF 50-55%. Sev dil LA.  Marland Kitchen Chronic venous stasis dermatitis of both lower extremities   . Cytomegaloviral disease ([place]) 2017  . Depression   . Diabetes mellitus   . Diverticulosis   . Dyspnea   . Erectile dysfunction   . ESRD (end stage renal disease) ([place]) 2005   a. HD 2004-2012; b. S/p renal transplant in 2012  . GERD (gastroesophageal reflux disease)   . Gout   . Hyperlipidemia    low since  renal failure  . Hypertension   . Kidney transplant status, cadaveric 2012  . Obesity   . PAH (pulmonary artery hypertension) ([place])    a. 05/2018 Echo: RVSP 74.86mHg; b. 12/2018 RV not well visualized.  . Pneumonia   . Purpura ([place]   . Sleep apnea    bipap with oxygen 2 l  . Trigger finger   . Ulcer 05/2016   Left shin  . Wears glasses     Patient Active Problem List   Diagnosis Date Noted  . Acute pain of left knee 03/07/2020  . Erectile dysfunction due to arterial insufficiency 10/20/2019  . Right ear pain 10/15/2019  . Chronic venous embolism and thrombosis of deep veins of upper extremity ([place] 07/23/2019  . Sleep disturbance 07/23/2019  . AV fistula occlusion ([place] 07/12/2019  . Permanent atrial fibrillation ([place]   . Atypical pneumonia 07/01/2019  . Synovitis of finger 06/11/2019  . Pulmonary hypertension due to left heart disease ([place] 01/20/2019  . Trigger finger, unspecified little finger 12/23/2018  . Superficial thrombophlebitis 12/23/2018  . Fever 11/10/2018  . SOB (shortness of breath) 11/06/2018  . CHF (congestive heart failure) ([place] 11/05/2018  . MDD (major depressive disorder), single episode, mild ([place] 04/29/2018  . Cervical sprain, initial encounter 03/02/2018  . Moderate persistent asthma 05/21/2017  . Cough 02/10/2017  . Chronic diastolic heart failure ([place] 11/19/2016  .  Chronic venous insufficiency 06/05/2016  . Proteinuria 06/23/2015  . Hypokalemia 10/13/2014  . BPH with obstruction/lower urinary tract symptoms 09/25/2014  . Hypogonadism in male 09/25/2014  . Immunosuppressed status ([place]) 09/09/2014  . Type 2 diabetes mellitus with hyperglycemia, with long-term current use of insulin ([place]) 09/09/2014  . Chronic atrial fibrillation ([place]) 07/18/2014  . DM eye manif type II 10/27/2013  . MDD (recurrent major depressive disorder) in remission ([place]) 10/04/2013  . Benign neoplasm of colon 04/22/2013  . Diverticulosis of colon (without mention of  hemorrhage) 04/22/2013  . History of renal transplant 03/01/2013  . Asthma, persistent controlled 02/25/2013  . Erectile dysfunction 10/20/2012  . Obstructive sleep apnea 09/29/2012  . Obesity hypoventilation syndrome ([place]) 03/31/2012  . Routine general medical examination at a health care facility 11/12/2011  . Type 2 diabetes, controlled, with renal manifestation ([place]) 03/01/2011  . Essential hypertension 12/17/2010  . Coronary artery disease involving native coronary artery of native heart without angina pectoris 12/17/2010  . Hyperlipidemia   . ESRD (end stage renal disease) [facility])     Past Surgical History:  Procedure Laterality Date  . BARIATRIC SURGERY    . CARDIAC CATHETERIZATION     [facility]  . CARDIAC CATHETERIZATION  05/03/2019  . CATARACT EXTRACTION W/PHACO Right 10/29/2017   Procedure: CATARACT EXTRACTION PHACO AND INTRAOCULAR LENS PLACEMENT ([place])  RIGHT DIABETIC;  Surgeon: [name], MD;  Location: [place];  Service: Ophthalmology;  Laterality: Right;  Diabetic - insulin  . CATARACT EXTRACTION W/PHACO Left 11/18/2017   Procedure: CATARACT EXTRACTION PHACO AND INTRAOCULAR LENS PLACEMENT ([place]) LEFT IVA/TOPICAL;  Surgeon: [name], MD;  Location: [place];  Service: Ophthalmology;  Laterality: Left;  DIABETES - insulin sleep apnea  . COLONOSCOPY WITH PROPOFOL N/A 04/22/2013   Procedure: COLONOSCOPY WITH PROPOFOL;  Surgeon: [name], MD;  [facility];  Service: Endoscopy;  Laterality: N/A;  . CORONARY ARTERY BYPASS GRAFT  2005   X 4  . DG ANGIO AV SHUNT*L*     right and left upper arms  . FASCIOTOMY  03/03/2012   Procedure: FASCIOTOMY;  Surgeon: [name], MD;  Location: [place];  Service: Orthopedics;  Laterality: Right;  FASCIOTOMY RIGHT SMALL FINGER  . FASCIOTOMY Left 08/17/2013   Procedure: FASCIOTOMY LEFT RING;  Surgeon: [name], MD;  Location: [place];  Service:  Orthopedics;  Laterality: Left;  . INCISION AND DRAINAGE ABSCESS Left 10/15/2015   Procedure: INCISION AND DRAINAGE ABSCESS;  Surgeon: [name], MD;  Location: [facility];  Service: General;  Laterality: Left;  . KIDNEY TRANSPLANT  09/13/2010   cadaver--at [facility]  . RIGHT HEART CATH N/A 11/15/2016   Procedure: RIGHT HEART CATH;  Surgeon: [name], MD;  Location: [place] [facility];  Service: Cardiovascular;  Laterality: N/A;  . RIGHT HEART CATH N/A 05/03/2019   Procedure: RIGHT HEART CATH;  Surgeon: [name], MD;  Location: [place] [facility];  Service: Cardiovascular;  Laterality: N/A;  . TYMPANIC MEMBRANE REPAIR  1/12   left  . VASECTOMY      Prior to Admission medications   Medication Sig Start Date End Date Taking? Authorizing Provider  beclomethasone (QVAR) 80 MCG/ACT inhaler Inhale 2 puffs into the lungs 2 (two) times daily.  10/01/13   [provider]  buPROPion (WELLBUTRIN SR) 150 MG 12 hr tablet Take 150 mg by mouth 2 (two) times daily.    [provider]  colchicine 0.6 MG  tablet Take 0.6 mg by mouth daily as needed. 10/21/19   [provider]  dapagliflozin propanediol (FARXIGA) 10 MG TABS tablet Take 1 tablet (10 mg total) by mouth daily before breakfast. 03/07/20   [name], MD  diclofenac Sodium (VOLTAREN) 1 % GEL Apply 2 g topically 3 (three) times daily as needed. 03/07/20   Pleas Koch, NP  ELIQUIS 5 MG TABS tablet TAKE 1 TABLET BY MOUTH TWICE DAILY 12/15/19   [name], MD  febuxostat (ULORIC) 40 MG tablet Take 40 mg by mouth daily. 01/31/20   [provider]  FERREX 150 150 MG capsule Take 150 mg by mouth daily.  09/23/19   [provider]  fluticasone (FLONASE) 50 MCG/ACT nasal spray PLACE 2 SPRAYS INTO BOTH NOSTRILS DAILY. 05/25/19   [name], MD  gabapentin (NEURONTIN) 300 MG capsule Take 300 mg by mouth 2 (two) times daily. 11/01/19   [provider]  HUMALOG KWIKPEN 100  UNIT/ML KwikPen Inject 5-10 Units into the skin 3 (three) times daily. Sliding scale 09/01/18   [provider]  insulin glargine (LANTUS) 100 unit/mL SOPN Inject 40 Units into the skin at bedtime.     [provider]  lidocaine (LIDODERM) 5 % Place 1 patch onto the skin every 12 (twelve) hours. Remove & Discard patch within 12 hours or as directed by MD 05/12/20 05/12/21  Sable Feil, PA-C  metolazone (ZAROXOLYN) 5 MG tablet Take 1 tablet (5 mg total) by mouth 2 (two) times a week. On tuesdays Sundays 03/09/20   [name], MD  mometasone (NASONEX) 50 MCG/ACT nasal spray Place 2 sprays into the nose daily as needed.    [provider]  montelukast (SINGULAIR) 10 MG tablet TAKE 1 TABLET BY MOUTH AT BEDTIME. 08/18/19   [name], MD  Multiple Vitamin (MULTIVITAMIN WITH MINERALS) TABS tablet Take 1 tablet by mouth daily.    [provider]  mycophenolate (MYFORTIC) 180 MG EC tablet Take 360 mg by mouth 2 (two) times daily.    [provider]  omeprazole (PRILOSEC) 20 MG capsule Take 1 capsule (20 mg total) by mouth daily. 10/05/19   [name], MD  potassium chloride (MICRO-K) 10 MEQ CR capsule Take 6 capsules (60 mEq total) by mouth 2 (two) times daily. Take an extra tab on Tue and Fri with metolazone 05/02/20   [name], MD  rosuvastatin (CRESTOR) 10 MG tablet TAKE 1 TABLET BY MOUTH DAILY 12/01/19   Loel Dubonnet, NP  tacrolimus (PROGRAF) 1 MG capsule 3 mg in AM and 22m in PM    [provider]  tadalafil, PAH, (ADCIRCA) 20 MG tablet Take 1 tablet (20 mg total) by mouth every other day. 01/28/20   AWellington Hampshire MD  tamsulosin (FLOMAX) 0.4 MG CAPS capsule Take 0.4 mg by mouth daily.     [provider]  torsemide (DEMADEX) 20 MG tablet Take 100 mg by mouth 2 (two) times daily.    [provider]  vitamin C (ASCORBIC ACID) 250 MG tablet Take 1 tablet (250 mg total) by mouth daily. 07/04/19   SMax Sane MD  zinc sulfate 220 (50 Zn) MG capsule Take 1 capsule (220 mg total) by mouth 2 (two) times daily. 07/04/19   SMax Sane MD    Allergies Contrast media [iodinated diagnostic agents] and Ibuprofen  Family History  Problem Relation Age of Onset  . Heart disease Father   .  Kidney failure Father   . Kidney disease Father   . Diabetes Maternal Grandmother   . Breast cancer Maternal Grandmother   . Valvular heart disease Mother   . Liver cancer Paternal Uncle   . Liver cancer Paternal Grandmother   . Prostate cancer Neg Hx     Social History Social History   Tobacco Use  . Smoking status: Former Smoker    Types: Cigars    Quit date: 09/02/1994    Years since quitting: 25.7  . Smokeless tobacco: Never Used  Vaping Use  . Vaping Use: Never used  Substance Use Topics  . Alcohol use: Yes    Alcohol/week: 0.0 - 1.0 standard drinks    Comment: occasional- 3 in the past year  . Drug use: No    Review of Systems  Constitutional: + fever. Eyes: Negative for visual changes. ENT: Negative for sore throat. Neck: No neck pain  Cardiovascular: Negative for chest pain. Respiratory: Negative for shortness of breath. Gastrointestinal: Negative for abdominal pain, vomiting or diarrhea. Genitourinary: Negative for dysuria. Musculoskeletal: Negative for back pain. + R elbow and wrist pain and swelling  Skin: Negative for rash. Neurological: Negative for headaches, weakness or numbness. Psych: No SI or HI  ____________________________________________   PHYSICAL EXAM:  VITAL SIGNS: Vitals:   05/14/20 0517 05/14/20 0628  BP:  125/64  Pulse:  (!) 57  Resp:  20  Temp: 98.3 F (36.8 C)   SpO2:  96%    Constitutional: Alert and oriented, patient is severe amount of pain.  HEENT:      Head: Normocephalic and atraumatic.         Eyes: Conjunctivae are normal. Sclera is non-icteric.       Mouth/Throat: Mucous membranes are moist.       Neck: Supple with no signs of  meningismus. Cardiovascular: Regular rate and rhythm. No murmurs, gallops, or rubs. 2+ symmetrical distal pulses are present in all extremities. No JVD. Respiratory: Normal respiratory effort. Lungs are clear to auscultation bilaterally.  Gastrointestinal: Soft, non tender, and non distended with positive bowel sounds. No rebound or guarding. Genitourinary: No CVA tenderness. Musculoskeletal:  R wrist and R elbow are swollen, erythematous, with limited ROM due to pain, and warm to the touch Neurologic: Normal speech and language. Face is symmetric. Moving all extremities. No gross focal neurologic deficits are appreciated. Skin: Skin is warm, dry and intact. No rash noted. Psychiatric: Mood and affect are normal. Speech and behavior are normal.  ____________________________________________   LABS (all labs ordered are listed, but only abnormal results are displayed)  Labs Reviewed  CBC WITH DIFFERENTIAL/PLATELET - Abnormal; Notable for the following components:      Result Value   Hemoglobin 12.1 (*)    MCV 75.1 (*)    MCH 23.3 (*)    RDW 17.8 (*)    Platelets 128 (*)    All other components within normal limits  BASIC METABOLIC PANEL - Abnormal; Notable for the following components:   Potassium 3.1 (*)    Chloride 96 (*)    Glucose, Bld 183 (*)    BUN 53 (*)    Creatinine, Ser 2.22 (*)    Calcium 8.6 (*)    GFR, Estimated 33 (*)    All other components within normal limits  BODY FLUID CULTURE W GRAM STAIN  CULTURE, BLOOD (ROUTINE X 2)  CULTURE, BLOOD (ROUTINE X 2)  URIC ACID  LACTIC ACID, PLASMA  SYNOVIAL CELL COUNT + DIFF, W/ CRYSTALS  CK  MAGNESIUM   ____________________________________________  EKG  none  ____________________________________________  RADIOLOGY  none  ____________________________________________   PROCEDURES  Procedure(s) performed: None Procedures Critical Care performed:  None ____________________________________________   INITIAL  IMPRESSION / ASSESSMENT AND PLAN / ED COURSE   59 y.o. male the history of immune suppression from a renal transplant who presents for evaluation of right elbow and wrist pain and fever.  Patient excruciating amount of pain, crying and moaning in bed.  Has very limited range of motion of the elbow and the wrist to to pain.  Both are swollen with erythema and warmth.  I attempted to collect synovial fluid from the right elbow unsuccessfully.  Here patient does not have a fever or a white count.  His uric acid is normal.  Lactic is normal.  At this time considering the patient is immune suppressed, has very limited range of motion of the joint, much more severe pain than before, fever at home I am concerned for infectious etiology such as cellulitis or septic joint.  He received IV morphine for pain.  The pain was then redosed with IV Dilaudid which made patient hypoxic.  He continues to endorse severe pain.  We will send blood cultures and cover patient with Zosyn and vancomycin.  We will get patient admitted to the hospitalist service for orthopedics consult and IV antibiotics.  History gathered from patient and his wife the bedside.  Plan discussed with both of them.      _____________________________________________ Please note:  Patient was evaluated in Emergency Department today for the symptoms described in the history of present illness. Patient was evaluated in the context of the global COVID-19 pandemic, which necessitated consideration that the patient might be at risk for infection with the SARS-CoV-2 virus that causes COVID-19. Institutional protocols and algorithms that pertain to the evaluation of patients at risk for COVID-19 are in a state of rapid change based on information released by regulatory bodies including the CDC and federal and state organizations. These policies and algorithms were followed during the patient's care in the ED.  Some ED evaluations and interventions may be delayed  as a result of limited staffing during the pandemic.   [place] Controlled Substance Database was reviewed by me. ____________________________________________   FINAL CLINICAL IMPRESSION(S) / ED DIAGNOSES   Final diagnoses:  Cellulitis of right upper extremity      NEW MEDICATIONS STARTED DURING THIS VISIT:  ED Discharge Orders    None       Note:  This document was prepared using Dragon voice recognition software and may include unintentional dictation errors.    Rudene Re, MD 05/14/20 [id number]

## 2020-05-15 ENCOUNTER — Telehealth: Payer: Self-pay

## 2020-05-15 DIAGNOSIS — I10 Essential (primary) hypertension: Secondary | ICD-10-CM

## 2020-05-15 DIAGNOSIS — I872 Venous insufficiency (chronic) (peripheral): Secondary | ICD-10-CM | POA: Diagnosis not present

## 2020-05-15 DIAGNOSIS — L03113 Cellulitis of right upper limb: Secondary | ICD-10-CM | POA: Diagnosis not present

## 2020-05-15 DIAGNOSIS — M1000 Idiopathic gout, unspecified site: Secondary | ICD-10-CM | POA: Diagnosis not present

## 2020-05-15 DIAGNOSIS — I5032 Chronic diastolic (congestive) heart failure: Secondary | ICD-10-CM | POA: Diagnosis not present

## 2020-05-15 LAB — CBC
HCT: 39.8 % (ref 39.0–52.0)
Hemoglobin: 12 g/dL — ABNORMAL LOW (ref 13.0–17.0)
MCH: 22.9 pg — ABNORMAL LOW (ref 26.0–34.0)
MCHC: 30.2 g/dL (ref 30.0–36.0)
MCV: 76 fL — ABNORMAL LOW (ref 80.0–100.0)
Platelets: 125 10*3/uL — ABNORMAL LOW (ref 150–400)
RBC: 5.24 MIL/uL (ref 4.22–5.81)
RDW: 17.4 % — ABNORMAL HIGH (ref 11.5–15.5)
WBC: 5.7 10*3/uL (ref 4.0–10.5)
nRBC: 0 % (ref 0.0–0.2)

## 2020-05-15 LAB — HIV ANTIBODY (ROUTINE TESTING W REFLEX): HIV Screen 4th Generation wRfx: NONREACTIVE

## 2020-05-15 LAB — BASIC METABOLIC PANEL
Anion gap: 10 (ref 5–15)
BUN: 58 mg/dL — ABNORMAL HIGH (ref 6–20)
CO2: 33 mmol/L — ABNORMAL HIGH (ref 22–32)
Calcium: 8.9 mg/dL (ref 8.9–10.3)
Chloride: 93 mmol/L — ABNORMAL LOW (ref 98–111)
Creatinine, Ser: 2.06 mg/dL — ABNORMAL HIGH (ref 0.61–1.24)
GFR, Estimated: 36 mL/min — ABNORMAL LOW (ref >60)
Glucose, Bld: 295 mg/dL — ABNORMAL HIGH (ref 70–99)
Potassium: 3.5 mmol/L (ref 3.5–5.1)
Sodium: 136 mmol/L (ref 135–145)

## 2020-05-15 LAB — GLUCOSE, CAPILLARY
Glucose-Capillary: 224 mg/dL — ABNORMAL HIGH (ref 70–99)
Glucose-Capillary: 265 mg/dL — ABNORMAL HIGH (ref 70–99)
Glucose-Capillary: 448 mg/dL — ABNORMAL HIGH (ref 70–99)
Glucose-Capillary: 485 mg/dL — ABNORMAL HIGH (ref 70–99)
Glucose-Capillary: 346 mg/dL — ABNORMAL HIGH (ref 70–99)

## 2020-05-15 MED ORDER — IPRATROPIUM-ALBUTEROL 0.5-2.5 (3) MG/3ML IN SOLN: 3.0000 mL | RESPIRATORY_TRACT | Status: DC | PRN

## 2020-05-15 MED ORDER — TACROLIMUS 1 MG PO CAPS
2.0000 mg | ORAL_CAPSULE | Freq: Every day | ORAL | Status: DC
  Administered 2020-05-15: 21:00:00 2 mg via ORAL
  Filled 2020-05-15 (×2): qty 2

## 2020-05-15 MED ORDER — TACROLIMUS 1 MG PO CAPS: 1.5000 mg | ORAL_CAPSULE | Freq: Every day | ORAL | Status: DC

## 2020-05-15 MED ORDER — INSULIN GLARGINE 100 UNIT/ML ~~LOC~~ SOLN
40.0000 [IU] | Freq: Every day | SUBCUTANEOUS | Status: DC
  Filled 2020-05-15: qty 0.4

## 2020-05-15 MED ORDER — INSULIN ASPART 100 UNIT/ML ~~LOC~~ SOLN
0.0000 [IU] | Freq: Every day | SUBCUTANEOUS | Status: DC
  Administered 2020-05-16: 23:00:00 5 [IU] via SUBCUTANEOUS
  Administered 2020-05-16: 4 [IU] via SUBCUTANEOUS
  Filled 2020-05-15 (×2): qty 1

## 2020-05-15 MED ORDER — INSULIN ASPART 100 UNIT/ML ~~LOC~~ SOLN
5.0000 [IU] | Freq: Three times a day (TID) | SUBCUTANEOUS | Status: DC
  Administered 2020-05-15 (×2): 5 [IU] via SUBCUTANEOUS
  Filled 2020-05-15 (×3): qty 1

## 2020-05-15 MED ORDER — TACROLIMUS 1 MG PO CAPS
3.0000 mg | ORAL_CAPSULE | Freq: Every morning | ORAL | Status: DC
  Administered 2020-05-16: 3 mg via ORAL
  Filled 2020-05-15: qty 3

## 2020-05-15 MED ORDER — TACROLIMUS 1 MG PO CAPS
2.0000 mg | ORAL_CAPSULE | Freq: Every morning | ORAL | Status: DC
Start: 2020-05-16 — End: 2020-05-15

## 2020-05-15 MED ORDER — INSULIN ASPART 100 UNIT/ML IV SOLN
10.0000 [IU] | Freq: Once | INTRAVENOUS | Status: AC
  Administered 2020-05-15: 10 [IU] via INTRAVENOUS
  Filled 2020-05-15: qty 0.1

## 2020-05-15 MED ORDER — INSULIN ASPART 100 UNIT/ML ~~LOC~~ SOLN
0.0000 [IU] | Freq: Three times a day (TID) | SUBCUTANEOUS | Status: DC
  Administered 2020-05-16: 17:00:00 15 [IU] via SUBCUTANEOUS
  Administered 2020-05-16: 12:00:00 8 [IU] via SUBCUTANEOUS
  Administered 2020-05-16: 09:00:00 3 [IU] via SUBCUTANEOUS
  Administered 2020-05-17: 2 [IU] via SUBCUTANEOUS
  Administered 2020-05-17: 13:00:00 5 [IU] via SUBCUTANEOUS
  Filled 2020-05-15 (×5): qty 1

## 2020-05-15 MED ORDER — INSULIN REGULAR HUMAN 100 UNIT/ML IJ SOLN
10.0000 [IU] | Freq: Once | INTRAMUSCULAR | Status: DC
  Filled 2020-05-15: qty 3

## 2020-05-15 NOTE — Telephone Encounter (Signed)
Spoke to pt. He said his leg is better. He has swelling and pain in his right wrist and elbow so he went to the ER yesterday. They admitted him with thoughts of cellulitis. It could be Gout.

## 2020-05-15 NOTE — TOC Initial Note (Signed)
Transition of Care (TOC) - Initial/Assessment Note    Patient Details  Name: [name]. MRN: [id number] Date of Birth: 09-13-1960  Transition of Care [facility]) CM/SW Contact:    Magnus Ivan, LCSW Phone Number: 05/15/2020, 2:50 PM  Clinical Narrative:                CSW spoke with patient for Readmission Screening. Patient lives with his wife. Drives himself to appointments. PCP is Dr. Silvio Pate. Pharmacy is [facility]. Patient has a RW and 3 in 1. Patient had [place] in the past, he thinks it was through [facility]. No SNF history. Patient reported he is planning to install grab bars in his shower and by his toilet. Patient denied needs at this time.   Expected Discharge Plan: Home/Self Care Barriers to Discharge: Continued Medical Work up   Patient Goals and CMS Choice Patient states their goals for this hospitalization and ongoing recovery are:: to return home with spouse CMS Medicare.gov Compare Post Acute Care list provided to:: Patient Choice offered to / list presented to : Patient  Expected Discharge Plan and Services Expected Discharge Plan: Home/Self Care                                              Prior Living Arrangements/Services     Patient language and need for interpreter reviewed:: Yes Do you feel safe going back to the place where you live?: Yes      Need for Family Participation in Patient Care: Yes (Comment) Care giver support system in place?: Yes (comment)   Criminal Activity/Legal Involvement Pertinent to Current Situation/Hospitalization: No - Comment as needed  Activities of Daily Living Home Assistive Devices/Equipment: None ADL Screening (condition at time of admission) Patient's cognitive ability adequate to safely complete daily activities?: Yes Is the patient deaf or have difficulty hearing?: No Does the patient have difficulty seeing, even when wearing glasses/contacts?: No Does the patient have difficulty  concentrating, remembering, or making decisions?: No Patient able to express need for assistance with ADLs?: No Does the patient have difficulty dressing or bathing?: No Independently performs ADLs?: Yes (appropriate for developmental age) Does the patient have difficulty walking or climbing stairs?: No Weakness of Legs: None Weakness of Arms/Hands: None  Permission Sought/Granted                  Emotional Assessment       Orientation: : Oriented to Self,Oriented to Place,Oriented to  Time,Oriented to Situation Alcohol / Substance Use: Not Applicable Psych Involvement: No (comment)  Admission diagnosis:  Cellulitis [L03.90] Elbow pain [M25.529] Wrist pain [M25.539] Cellulitis of right upper extremity [L03.113] Patient Active Problem List   Diagnosis Date Noted  . Cellulitis 05/14/2020  . Acute pain of left knee 03/07/2020  . Erectile dysfunction due to arterial insufficiency 10/20/2019  . Right ear pain 10/15/2019  . Chronic venous embolism and thrombosis of deep veins of upper extremity ([place]) 07/23/2019  . Sleep disturbance 07/23/2019  . AV fistula occlusion ([place]) 07/12/2019  . Permanent atrial fibrillation ([place])   . Atypical pneumonia 07/01/2019  . Synovitis of finger 06/11/2019  . Pulmonary hypertension due to left heart disease ([place]) 01/20/2019  . Trigger finger, unspecified little finger 12/23/2018  . Superficial thrombophlebitis 12/23/2018  . Fever 11/10/2018  . SOB (shortness of breath) 11/06/2018  . CHF (congestive heart failure) (  [place]) 11/05/2018  . MDD (major depressive disorder), single episode, mild ([place]) 04/29/2018  . Gout attack 03/30/2018  . Cervical sprain, initial encounter 03/02/2018  . Moderate persistent asthma 05/21/2017  . Cough 02/10/2017  . Chronic diastolic heart failure ([place]) 11/19/2016  . Chronic venous insufficiency 06/05/2016  . Proteinuria 06/23/2015  . Hypokalemia 10/13/2014  . BPH with obstruction/lower urinary tract symptoms  09/25/2014  . Hypogonadism in male 09/25/2014  . Immunosuppressed status ([place]) 09/09/2014  . Type 2 diabetes mellitus with hyperglycemia, with long-term current use of insulin ([place]) 09/09/2014  . Chronic atrial fibrillation ([place]) 07/18/2014  . DM eye manif type II 10/27/2013  . MDD (recurrent major depressive disorder) in remission ([place]) 10/04/2013  . Benign neoplasm of colon 04/22/2013  . Diverticulosis of colon (without mention of hemorrhage) 04/22/2013  . History of renal transplant 03/01/2013  . Asthma, persistent controlled 02/25/2013  . Erectile dysfunction 10/20/2012  . Obstructive sleep apnea 09/29/2012  . Obesity hypoventilation syndrome ([place]) 03/31/2012  . Routine general medical examination at a health care facility 11/12/2011  . Type 2 diabetes, controlled, with renal manifestation ([place]) 03/01/2011  . Essential hypertension 12/17/2010  . Coronary artery disease involving native coronary artery of native heart without angina pectoris 12/17/2010  . Hyperlipidemia   . ESRD (end stage renal disease) ([place])    PCP:  [name], MD Pharmacy:   [place] [place] [facility] [address] [place] [address] Phone: [phone] Fax: [phone]     Social Determinants of Health (SDOH) Interventions    Readmission Risk Interventions Readmission Risk Prevention Plan 05/15/2020 07/15/2019 07/02/2019  Transportation Screening Complete Complete Complete  PCP or Specialist Appt within 3-5 Days Complete - -  HRI or Home Care Consult Complete - -  Social Work Consult for [place] Planning/Counseling Complete - -  Palliative Care Screening Not Applicable - -  Medication Review Press photographer) Complete Complete Complete  PCP or Specialist appointment within 3-5 days of discharge - Complete Complete  SW Recovery Care/Counseling Consult - Complete -  Palliative Care Screening - Not Applicable Not [place] - Not Applicable Not Applicable  Some recent data might be hidden

## 2020-05-15 NOTE — Progress Notes (Signed)
Rapid response consult:  Proactive rounding by RRT. Patient seen for elevated CBG. MD notified by bedside RN. Plan is for single-time dose of IV insulin. RRT will continue to follow.

## 2020-05-15 NOTE — Progress Notes (Signed)
PROGRESS NOTE    Erasmus Ulas Zuercher.  TIR:[id number] DOB: [birth year] DOA: 05/14/2020 PCP: [name], MD    Brief Narrative:  Loye Cadyn Fann. is a 60 y.o. male with medical history significant for end-stage renal disease status post renal transplant, coronary artery disease status post CABG, history of A. fib on chronic anticoagulation therapy, chronic venous stasis involving both lower extremities, diabetes mellitus, GERD and sleep apnea who presents to the ER for evaluation of pain involving his right elbow, wrist and hand.  He rates his pain a 10 x 10 in intensity at its worst and any form of movement or touch worsens his pain.  He does have a history of gout and has had flareups in the past but none as bad as today.  Extremity is warm to touch with streaking over his right forearm. Uric acid nml.   He received a dose of Zosyn in the ER and admission was requested for concerns of possible right arm cellulitis. orthopedic   2/20-BG levels up. On steroid but candy by bedside. Told pt not eat candy.   Consultants:   orthopedics  Procedures:   Antimicrobials:   keflex    Subjective: Rt elbow pain less, able to move it a little  Objective: Vitals:   05/14/20 1635 05/14/20 2005 05/14/20 2334 05/15/20 0347  BP: 138/74 (!) 137/118 (!) 152/72 (!) 143/62  Pulse: 77 77 (!) 56 (!) 54  Resp: _0 Temp: 97.7 F (36.5 C) 98.6 F (37 C) 97.8 F (36.6 C) 97.6 F (36.4 C)  TempSrc: Oral     SpO2: 100% 94% 100% 100%  Weight:      Height:        Intake/Output Summary (Last 24 hours) at 05/15/2020 0801 Last data filed at 05/15/2020 0402 Gross per 24 hour  Intake 149.22 ml  Output 400 ml  Net -250.78 ml   Filed Weights   05/14/20 0515  Weight: 117.9 kg    Examination:  General exam: Appears calm and comfortable  Respiratory system: Clear to auscultation. Respiratory effort normal. Cardiovascular system: S1 & S2 heard, RRR. No JVD, murmurs, rubs,  gallops or clicks.  Gastrointestinal system: Abdomen is nondistended, soft and nontender. Normal bowel sounds heard. Central nervous system: Alert and oriented. No focal neurological deficits. Extremities: Rt elbow less erythematous, warm to touch, edematous, no lower extremity edema Skin: warm,d ry Psychiatry: Judgement and insight appear normal. Mood & affect appropriate.     Data Reviewed: I have personally reviewed following labs and imaging studies  CBC: Recent Labs  Lab 05/14/20 0550 05/15/20 0411  WBC 8.8 5.7  NEUTROABS 6.7  --   HGB 12.1* 12.0*  HCT 39.0 39.8  MCV 75.1* 76.0*  PLT 128* 676*   Basic Metabolic Panel: Recent Labs  Lab 05/14/20 0550 05/15/20 0411  NA 135 136  K 3.1* 3.5  CL 96* 93*  CO2 29 33*  GLUCOSE 183* 295*  BUN 53* 58*  CREATININE 2.22* 2.06*  CALCIUM 8.6* 8.9  MG 1.7  --    GFR: Estimated Creatinine Clearance: 49.1 mL/min (A) (by C-G formula based on SCr of 2.06 mg/dL (H)). Liver Function Tests: No results for input(s): AST, ALT, ALKPHOS, BILITOT, PROT, ALBUMIN in the last 168 hours. No results for input(s): LIPASE, AMYLASE in the last 168 hours. No results for input(s): AMMONIA in the last 168 hours. Coagulation Profile: No results for input(s): INR, PROTIME in the last 168 hours. Cardiac Enzymes: Recent  Labs  Lab 05/14/20 0550  CKTOTAL 55   BNP (last 3 results) No results for input(s): PROBNP in the last 8760 hours. HbA1C: Recent Labs    05/14/20 1711  HGBA1C 8.7*   CBG: Recent Labs  Lab 05/14/20 1706 05/14/20 2101  GLUCAP 327* 369*   Lipid Profile: No results for input(s): CHOL, HDL, LDLCALC, TRIG, CHOLHDL, LDLDIRECT in the last 72 hours. Thyroid Function Tests: No results for input(s): TSH, T4TOTAL, FREET4, T3FREE, THYROIDAB in the last 72 hours. Anemia Panel: No results for input(s): VITAMINB12, FOLATE, FERRITIN, TIBC, IRON, RETICCTPCT in the last 72 hours. Sepsis Labs: Recent Labs  Lab 05/14/20 0550   LATICACIDVEN 1.5    Recent Results (from the past 240 hour(s))  SARS CORONAVIRUS 2 (TAT 6-24 HRS) Nasopharyngeal Nasopharyngeal Swab     Status: None   Collection Time: 05/14/20 11:36 AM   Specimen: Nasopharyngeal Swab  Result Value Ref Range Status   SARS Coronavirus 2 NEGATIVE NEGATIVE Final    Comment: (NOTE) SARS-CoV-2 target nucleic acids are NOT DETECTED.  The SARS-CoV-2 RNA is generally detectable in upper and lower respiratory specimens during the acute phase of infection. Negative results do not preclude SARS-CoV-2 infection, do not rule out co-infections with other pathogens, and should not be used as the sole basis for treatment or other patient management decisions. Negative results must be combined with clinical observations, patient history, and epidemiological information. The expected result is Negative.  Fact Sheet for Patients: SugarRoll.be  Fact Sheet for Healthcare Providers: https://www.woods-mathews.com/  This test is not yet approved or cleared by the [place] FDA and  has been authorized for detection and/or diagnosis of SARS-CoV-2 by FDA under an Emergency Use Authorization (EUA). This EUA will remain  in effect (meaning this test can be used) for the duration of the COVID-19 declaration under Se ction 564(b)(1) of the Act, 21 U.S.C. section 360bbb-3(b)(1), unless the authorization is terminated or revoked sooner.  Performed at [facility], [place] [address], [place], [place] [address]          Radiology Studies: DG ELBOW COMPLETE RIGHT (3+VIEW)  Result Date: 05/14/2020 CLINICAL DATA:  Severe right elbow pain. History of gout. No known injury. EXAM: RIGHT ELBOW - COMPLETE 3+ VIEW COMPARISON:  None. FINDINGS: No acute bony or joint abnormality. No focal lesion. Small to moderate elbow joint effusion. Vascular stents noted. IMPRESSION: Small to moderate elbow joint effusion. The exam is otherwise  negative. Electronically Signed   By: Inge Rise M.D.   On: 05/14/2020 16:31   DG Wrist Complete Right  Result Date: 05/14/2020 CLINICAL DATA:  60 year old male with right wrist pain. EXAM: RIGHT WRIST - COMPLETE 3+ VIEW COMPARISON:  None. FINDINGS: There is no acute fracture or dislocation. The bones are mildly osteopenic. No significant arthritic changes. Mild soft tissue swelling. Vascular calcifications noted. IMPRESSION: No acute fracture or dislocation. Electronically Signed   By: Anner Crete M.D.   On: 05/14/2020 16:31        Scheduled Meds: . apixaban  5 mg Oral BID  . vitamin C  250 mg Oral Daily  . budesonide (PULMICORT) nebulizer solution  0.25 mg Nebulization BID  . buPROPion  150 mg Oral BID  . [START ON 05/16/2020] colchicine  0.3 mg Oral QODAY  . febuxostat  40 mg Oral Daily  . fluticasone  2 spray Each Nare Daily  . gabapentin  300 mg Oral BID  . insulin aspart  0-15 Units Subcutaneous TID WC  .  insulin glargine  40 Units Subcutaneous QHS  . iron polysaccharides  150 mg Oral Daily  . lidocaine  1 patch Transdermal Q24H  . magnesium oxide  400 mg Oral Daily  . metolazone  5 mg Oral Once per day on Mon Thu  . montelukast  10 mg Oral QHS  . multivitamin with minerals  1 tablet Oral Daily  . mycophenolate  360 mg Oral BID  . pantoprazole  40 mg Oral Daily  . potassium chloride  30 mEq Oral BID  . predniSONE  40 mg Oral Q breakfast  . rosuvastatin  10 mg Oral Daily  . sodium chloride flush  3 mL Intravenous Q12H  . tacrolimus  2 mg Oral Q2200  . tacrolimus  3 mg Oral q AM  . tamsulosin  0.4 mg Oral Daily  . torsemide  100 mg Oral BID  . zinc sulfate  220 mg Oral BID   Continuous Infusions: . sodium chloride 250 mL (05/15/20 0435)  .  ceFAZolin (ANCEF) IV 2 g (05/15/20 0436)    Assessment & Plan:   Principal Problem:   Gout attack Active Problems:   Type 2 diabetes, controlled, with renal manifestation (HCC)   Chronic atrial fibrillation (HCC)    Essential hypertension   Hypokalemia   Chronic venous insufficiency   Chronic diastolic heart failure (HCC)   Cellulitis   Acute gout flare v.s. rule out right arm cellulitis  Patient with a history of gout who presents to the ER for evaluation of severe pain involving the right elbow, right wrist and hand. Patient noted to have differential warmth involving the right forearm and streaking over the right forearm concerning for possible cellulitis 2/21-clinically improving Ortho following, recommend continue steroid for acute gout flare as pt has had this before, also continue iv abx for now. F/u cx contineu cochicine and Uloric Uric acid nml F/u ortho for any new recommendations.    Chronic atrial fibrillation On Eliquis for primary prophylaxis for acute stroke      Diabetes mellitus with complications of end-stage renal disease status post renal transplant Maintain consistent carbohydrate diet 2/19-pt was eating candy in room, counseled not eat sweets as his bg elevated here especially on steroid. contine lantus Add novolog 5 units tid with meals riss Continue suppressive therapy with prograf and mycophenolate    Chronic diastolic dysfunction CHF Euvolemic without exacerbation  Continue diuretic therapy with metolazone and torsemide     Depression Continue wellbutrin   Hypokalemia/hypomagnesemia Related to diuretic therapy Supplement potassium and magnesium   BPH Continue Flomax   DVT prophylaxis: Eliquis Code Status: Full Family Communication: None at bedside  Status is: Inpatient  Remains inpatient appropriate because:Inpatient level of care appropriate due to severity of illness   Dispo: The patient is from: Home              Anticipated d/c is to: Home              Anticipated d/c date is: 1 day              Patient currently is not medically stable to d/c.   Difficult to place patient No            LOS: 1 day   Time  spent: 35 minutes with more than 50% on [place], MD [facility] Pager [phone]  If 7PM-7AM, please contact night-coverage 05/15/2020, 8:01 AM

## 2020-05-15 NOTE — Progress Notes (Signed)
Subjective:  Patient seen in his room at approximately 2 PM today.  Patient was sitting up in a chair.  Patient states he feels much better today.  His pain has much improved.  He has noted improved range of motion.  Objective:   VITALS:   Vitals:   05/15/20 0757 05/15/20 0807 05/15/20 1153 05/15/20 1539  BP:  (!) 183/92 (!) 165/83 (!) 96/47  Pulse:  (!) 58 (!) 55 64  Resp:  _0 Temp:  97.9 F (36.6 C) (!) 97.5 F (36.4 C) 97.9 F (36.6 C)  TempSrc:   Oral Oral  SpO2: 96% 97% 93% 93%  Weight:      Height:        PHYSICAL EXAM: Right upper extremity: Patient's forearm swelling has improved.  His elbow swelling is also dramatically improved.  Patient can extend to approximately -30 degrees today and flex to proximal 120 degrees.  He has increased supination pronation of the forearm without pain.  He can flex and extend his wrist approximately 50 to 60 degrees.  He has full digital range of motion.  His forearm compartments are soft and compressible.  There is no significant erythema seen.  He is neurovascular intact.   LABS  Results for orders placed or performed during the hospital encounter of 05/14/20 (from the past 24 hour(s))  Glucose, capillary     Status: Abnormal   Collection Time: 05/14/20  9:01 PM  Result Value Ref Range   Glucose-Capillary 369 (H) 70 - 99 mg/dL  CBC     Status: Abnormal   Collection Time: 05/15/20  4:11 AM  Result Value Ref Range   WBC 5.7 4.0 - 10.5 K/uL   RBC 5.24 4.22 - 5.81 MIL/uL   Hemoglobin 12.0 (L) 13.0 - 17.0 g/dL   HCT 39.8 39.0 - 52.0 %   MCV 76.0 (L) 80.0 - 100.0 fL   MCH 22.9 (L) 26.0 - 34.0 pg   MCHC 30.2 30.0 - 36.0 g/dL   RDW 17.4 (H) 11.5 - 15.5 %   Platelets 125 (L) 150 - 400 K/uL   nRBC 0.0 0.0 - 0.2 %  Basic metabolic panel     Status: Abnormal   Collection Time: 05/15/20  4:11 AM  Result Value Ref Range   Sodium 136 135 - 145 mmol/L   Potassium 3.5 3.5 - 5.1 mmol/L   Chloride 93 (L) 98 - 111 mmol/L   CO2 33 (H)  22 - 32 mmol/L   Glucose, Bld 295 (H) 70 - 99 mg/dL   BUN 58 (H) 6 - 20 mg/dL   Creatinine, Ser 2.06 (H) 0.61 - 1.24 mg/dL   Calcium 8.9 8.9 - 10.3 mg/dL   GFR, Estimated 36 (L) >60 mL/min   Anion gap 10 5 - 15  Glucose, capillary     Status: Abnormal   Collection Time: 05/15/20  8:24 AM  Result Value Ref Range   Glucose-Capillary 224 (H) 70 - 99 mg/dL  Glucose, capillary     Status: Abnormal   Collection Time: 05/15/20 11:47 AM  Result Value Ref Range   Glucose-Capillary 265 (H) 70 - 99 mg/dL  Glucose, capillary     Status: Abnormal   Collection Time: 05/15/20  4:40 PM  Result Value Ref Range   Glucose-Capillary 448 (H) 70 - 99 mg/dL    DG ELBOW COMPLETE RIGHT (3+VIEW)  Result Date: 05/14/2020 CLINICAL DATA:  Severe right elbow pain. History of gout. No known injury. EXAM: RIGHT ELBOW -  COMPLETE 3+ VIEW COMPARISON:  None. FINDINGS: No acute bony or joint abnormality. No focal lesion. Small to moderate elbow joint effusion. Vascular stents noted. IMPRESSION: Small to moderate elbow joint effusion. The exam is otherwise negative. Electronically Signed   By: Inge Rise M.D.   On: 05/14/2020 16:31   DG Wrist Complete Right  Result Date: 05/14/2020 CLINICAL DATA:  60 year old male with right wrist pain. EXAM: RIGHT WRIST - COMPLETE 3+ VIEW COMPARISON:  None. FINDINGS: There is no acute fracture or dislocation. The bones are mildly osteopenic. No significant arthritic changes. Mild soft tissue swelling. Vascular calcifications noted. IMPRESSION: No acute fracture or dislocation. Electronically Signed   By: Anner Crete M.D.   On: 05/14/2020 16:31    Assessment/Plan:     Principal Problem:   Gout attack Active Problems:   Type 2 diabetes, controlled, with renal manifestation (HCC)   Chronic atrial fibrillation (HCC)   Essential hypertension   Hypokalemia   Chronic venous insufficiency   Chronic diastolic heart failure (HCC)   Cellulitis  Significant improvement in the  patient's right elbow pain and forearm swelling.  Patient is responding to medical treatment.  It is likely his elbow is from another flareup of gout.  His erythema and swelling in the forearm are also improving and he is likely responding to IV antibiotic treatment for possible cellulitis.  I will continue to follow but do not foresee any surgical intervention will be necessary.    [place] , MD 05/15/2020, 7:29 PM

## 2020-05-15 NOTE — Progress Notes (Signed)
[place], [place] 05/15/20  Subjective:   Hospital day # 1  Patient known to our practice from previous admission This time presents for acute exacerbation of joint pain in the right elbow and right wrist.  Diagnosed with acute gout vs cellulitis and admitted for further evaluation.  Nephrology consult requested to evaluate transplant regimen and follow along.  Objective:  Vital signs in last 24 hours:  Temp:  [97.5 F (36.4 C)-98.6 F (37 C)] 97.5 F (36.4 C) (02/21 1153) Pulse Rate:  [54-84] 55 (02/21 1153) Resp:  [15-20] 15 (02/21 0807) BP: (137-183)/(62-138) 165/83 (02/21 1153) SpO2:  [83 %-100 %] 93 % (02/21 1153)  Weight change:  Filed Weights   05/14/20 0515  Weight: 117.9 kg    Intake/Output:    Intake/Output Summary (Last 24 hours) at 05/15/2020 1211 Last data filed at 05/15/2020 1013 Gross per 24 hour  Intake 509.22 ml  Output 1200 ml  Net -690.78 ml    Physical Exam: General:  No acute distress, sitting up in chair  HEENT  anicteric, moist oral mucous membrane  Pulm/lungs  normal breathing effort, lungs are clear to auscultation  CVS/Heart  regular rhythm, no rub or gallop  Abdomen:   Soft, nontender  Extremities:  No peripheral edema  Neurologic:  Alert, oriented, able to follow commands  Skin:  No acute rashes  Right elbow and right wrist swelling and edema    Basic Metabolic Panel:  Recent Labs  Lab 05/14/20 0550 05/15/20 0411  NA 135 136  K 3.1* 3.5  CL 96* 93*  CO2 29 33*  GLUCOSE 183* 295*  BUN 53* 58*  CREATININE 2.22* 2.06*  CALCIUM 8.6* 8.9  MG 1.7  --      CBC: Recent Labs  Lab 05/14/20 0550 05/15/20 0411  WBC 8.8 5.7  NEUTROABS 6.7  --   HGB 12.1* 12.0*  HCT 39.0 39.8  MCV 75.1* 76.0*  PLT 128* 125*     No results found for: HEPBSAG, HEPBSAB, HEPBIGM    Microbiology:  Recent Results (from the past 240 hour(s))  Blood culture (routine x 2)     Status: None (Preliminary result)    Collection Time: 05/14/20  7:50 AM   Specimen: BLOOD  Result Value Ref Range Status   Specimen Description BLOOD RIGHT ARM  Final   Special Requests   Final    BOTTLES DRAWN AEROBIC AND ANAEROBIC Blood Culture results may not be optimal due to an inadequate volume of blood received in culture bottles   Culture   Final    NO GROWTH 1 DAY Performed at [facility], [address] [place], [place] [address]    Report Status PENDING  Incomplete  Blood culture (routine x 2)     Status: None (Preliminary result)   Collection Time: 05/14/20  7:51 AM   Specimen: BLOOD  Result Value Ref Range Status   Specimen Description BLOOD BLOOD RIGHT FOREARM  Final   Special Requests   Final    BOTTLES DRAWN AEROBIC AND ANAEROBIC Blood Culture adequate volume   Culture   Final    NO GROWTH 1 DAY Performed at [facility], [address] [place], [place] [address]    Report Status PENDING  Incomplete  SARS CORONAVIRUS 2 (TAT 6-24 HRS) Nasopharyngeal Nasopharyngeal Swab     Status: None   Collection Time: 05/14/20 11:36 AM   Specimen: Nasopharyngeal Swab  Result Value Ref Range Status   SARS Coronavirus 2  NEGATIVE NEGATIVE Final    Comment: (NOTE) SARS-CoV-2 target nucleic acids are NOT DETECTED.  The SARS-CoV-2 RNA is generally detectable in upper and lower respiratory specimens during the acute phase of infection. Negative results do not preclude SARS-CoV-2 infection, do not rule out co-infections with other pathogens, and should not be used as the sole basis for treatment or other patient management decisions. Negative results must be combined with clinical observations, patient history, and epidemiological information. The expected result is Negative.  Fact Sheet for Patients: SugarRoll.be  Fact Sheet for Healthcare Providers: https://www.woods-mathews.com/  This test is not yet approved or cleared by the [place] FDA and   has been authorized for detection and/or diagnosis of SARS-CoV-2 by FDA under an Emergency Use Authorization (EUA). This EUA will remain  in effect (meaning this test can be used) for the duration of the COVID-19 declaration under Se ction 564(b)(1) of the Act, 21 U.S.C. section 360bbb-3(b)(1), unless the authorization is terminated or revoked sooner.  Performed at [facility], [place] [address], [place], [place] [address]     Coagulation Studies: No results for input(s): LABPROT, INR in the last 72 hours.  Urinalysis: No results for input(s): COLORURINE, LABSPEC, PHURINE, GLUCOSEU, HGBUR, BILIRUBINUR, KETONESUR, PROTEINUR, UROBILINOGEN, NITRITE, LEUKOCYTESUR in the last 72 hours.  Invalid input(s): APPERANCEUR    Imaging: DG ELBOW COMPLETE RIGHT (3+VIEW)  Result Date: 05/14/2020 CLINICAL DATA:  Severe right elbow pain. History of gout. No known injury. EXAM: RIGHT ELBOW - COMPLETE 3+ VIEW COMPARISON:  None. FINDINGS: No acute bony or joint abnormality. No focal lesion. Small to moderate elbow joint effusion. Vascular stents noted. IMPRESSION: Small to moderate elbow joint effusion. The exam is otherwise negative. Electronically Signed   By: Inge Rise M.D.   On: 05/14/2020 16:31   DG Wrist Complete Right  Result Date: 05/14/2020 CLINICAL DATA:  60 year old male with right wrist pain. EXAM: RIGHT WRIST - COMPLETE 3+ VIEW COMPARISON:  None. FINDINGS: There is no acute fracture or dislocation. The bones are mildly osteopenic. No significant arthritic changes. Mild soft tissue swelling. Vascular calcifications noted. IMPRESSION: No acute fracture or dislocation. Electronically Signed   By: Anner Crete M.D.   On: 05/14/2020 16:31     Medications:   . sodium chloride 250 mL (05/15/20 0435)  .  ceFAZolin (ANCEF) IV 2 g (05/15/20 1138)   . apixaban  5 mg Oral BID  . vitamin C  250 mg Oral Daily  . buPROPion  150 mg Oral BID  . [START ON 05/16/2020] colchicine  0.3 mg  Oral QODAY  . febuxostat  40 mg Oral Daily  . fluticasone  2 spray Each Nare Daily  . gabapentin  300 mg Oral BID  . insulin aspart  0-15 Units Subcutaneous TID WC  . insulin aspart  5 Units Subcutaneous TID WC  . insulin glargine  40 Units Subcutaneous QHS  . iron polysaccharides  150 mg Oral Daily  . lidocaine  1 patch Transdermal Q24H  . magnesium oxide  400 mg Oral Daily  . metolazone  5 mg Oral Once per day on Mon Thu  . montelukast  10 mg Oral QHS  . multivitamin with minerals  1 tablet Oral Daily  . mycophenolate  360 mg Oral BID  . pantoprazole  40 mg Oral Daily  . potassium chloride  30 mEq Oral BID  . predniSONE  40 mg Oral Q breakfast  . rosuvastatin  10 mg Oral Daily  . sodium chloride flush  3 mL Intravenous Q12H  . tacrolimus  2 mg Oral Q2200  . tacrolimus  3 mg Oral q AM  . tamsulosin  0.4 mg Oral Daily  . torsemide  100 mg Oral BID  . zinc sulfate  220 mg Oral BID   sodium chloride, acetaminophen **OR** acetaminophen, HYDROcodone-acetaminophen, ipratropium-albuterol, ondansetron **OR** ondansetron (ZOFRAN) IV, sodium chloride flush  Assessment/ Plan:  61 y.o. male with  Diabetes, hypertension, Coronary disease, CABG, deceased donor renal transplant 09/13/2010, Gastric sleeve surgery 03/14/2014  admitted on 05/14/2020 for Cellulitis [L03.90] Elbow pain [M25.529] Wrist pain [M25.539] Cellulitis of right upper extremity [L03.113]  Renal transplant status CKD stage IIIB with diabetic nephropathy-biopsy 2017  immunosuppression induction with Campath, now maintenance with tacrolimus and mycophenolate. 1 month allograft biopsy July 2012- recovering ATN with early tubular atrophy, interstitial fibrosis. Repeat biopsy March 2017 for increasing proteinuria- no acute rejection, showed diabetic nephropathy, FSGS  Followed by transplant center [facility] in [place].  Last seen on February 15. - Continue home dose of immunosuppression Currently mycophenolate  (Myfortic) 360 mg twice a day Latest transplant note from February 18 states Prograf dose 2 mg in a.m. and 1.5 mg in the afternoon.  We will adjust the orders.   Pulmonary hypertension, lower extremity edema Aggressive diuretic regimen of torsemide 100 mg twice a day Follow strict low-sodium diet Dose of torsemide may need to be adjusted.  Will discuss with patient.  DM-2 with CKD Lab Results  Component Value Date   HGBA1C 8.7 (H) 05/14/2020   needs stricter diet control    LOS: 1 Jendayi Berling 2/21/202212:11 PM  [place], [place]  Note: This note was prepared with Dragon dictation. Any transcription errors are unintentional

## 2020-05-16 DIAGNOSIS — I872 Venous insufficiency (chronic) (peripheral): Secondary | ICD-10-CM | POA: Diagnosis not present

## 2020-05-16 DIAGNOSIS — L03113 Cellulitis of right upper limb: Secondary | ICD-10-CM | POA: Diagnosis not present

## 2020-05-16 DIAGNOSIS — M1000 Idiopathic gout, unspecified site: Secondary | ICD-10-CM | POA: Diagnosis not present

## 2020-05-16 DIAGNOSIS — I5032 Chronic diastolic (congestive) heart failure: Secondary | ICD-10-CM | POA: Diagnosis not present

## 2020-05-16 LAB — GLUCOSE, CAPILLARY
Glucose-Capillary: 192 mg/dL — ABNORMAL HIGH (ref 70–99)
Glucose-Capillary: 276 mg/dL — ABNORMAL HIGH (ref 70–99)
Glucose-Capillary: 294 mg/dL — ABNORMAL HIGH (ref 70–99)
Glucose-Capillary: 419 mg/dL — ABNORMAL HIGH (ref 70–99)
Glucose-Capillary: 484 mg/dL — ABNORMAL HIGH (ref 70–99)

## 2020-05-16 LAB — PLATELET COUNT: Platelets: 139 10*3/uL — ABNORMAL LOW (ref 150–400)

## 2020-05-16 MED ORDER — INSULIN ASPART 100 UNIT/ML ~~LOC~~ SOLN
10.0000 [IU] | Freq: Three times a day (TID) | SUBCUTANEOUS | Status: DC
  Administered 2020-05-16 – 2020-05-17 (×3): 10 [IU] via SUBCUTANEOUS
  Filled 2020-05-16 (×3): qty 1

## 2020-05-16 MED ORDER — INSULIN ASPART 100 UNIT/ML ~~LOC~~ SOLN: 8.0000 [IU] | Freq: Three times a day (TID) | SUBCUTANEOUS | Status: DC

## 2020-05-16 MED ORDER — INSULIN ASPART 100 UNIT/ML ~~LOC~~ SOLN
10.0000 [IU] | Freq: Once | SUBCUTANEOUS | Status: AC
  Administered 2020-05-16: 10 [IU] via SUBCUTANEOUS
  Filled 2020-05-16: qty 1

## 2020-05-16 MED ORDER — TACROLIMUS 1 MG PO CAPS
2.0000 mg | ORAL_CAPSULE | Freq: Every morning | ORAL | Status: DC
  Administered 2020-05-17: 09:00:00 2 mg via ORAL
  Filled 2020-05-16 (×3): qty 2

## 2020-05-16 MED ORDER — TACROLIMUS 1 MG PO CAPS
1.5000 mg | ORAL_CAPSULE | Freq: Every day | ORAL | Status: DC
  Administered 2020-05-16: 21:00:00 1.5 mg via ORAL
  Filled 2020-05-16 (×2): qty 1

## 2020-05-16 MED ORDER — INSULIN GLARGINE 100 UNIT/ML ~~LOC~~ SOLN
5.0000 [IU] | Freq: Once | SUBCUTANEOUS | Status: AC
  Administered 2020-05-16: 5 [IU] via SUBCUTANEOUS
  Filled 2020-05-16: qty 0.05

## 2020-05-16 MED ORDER — INSULIN GLARGINE 100 UNIT/ML ~~LOC~~ SOLN
45.0000 [IU] | Freq: Every day | SUBCUTANEOUS | Status: DC
  Administered 2020-05-16: 23:00:00 45 [IU] via SUBCUTANEOUS
  Filled 2020-05-16 (×2): qty 0.45

## 2020-05-16 NOTE — Progress Notes (Signed)
Subjective:  Patient seen in his hospital room.  He is up out of bed to a chair.  Patient states he has slightly more right elbow pain than he did yesterday but the site of his pain is at an attempted aspiration site from the ED.  Objective:   VITALS:   Vitals:   05/16/20 0534 05/16/20 0720 05/16/20 0807 05/16/20 1312  BP: 123/70 (!) 155/101 (!) 161/94 (!) 164/98  Pulse: (!) 54 (!) 44 60 68  Resp: _0 Temp: 98 F (36.7 C) 98.3 F (36.8 C) 98 F (36.7 C) (!) 97.5 F (36.4 C)  TempSrc:  Oral  Oral  SpO2: 98% 95% 100% 94%  Weight:      Height:        PHYSICAL EXAM: Right upper extremity: Patient's elbow range of motion continues to improve.  There is minimal swelling and no erythema over his right elbow or wrist.  His proximal -20 degrees today of extension and can flex to proximal 120 degrees.  He has supination of approximately 60 to 70 degrees and pronation approximately 80 degrees.  He can flex and extend his wrist approximately 50 to 60 degrees without significant pain.  The forearm swelling is dramatically improved.  He has full digital range of motion, intact sensation light touch in his fingers well-perfused.   LABS  Results for orders placed or performed during the hospital encounter of 05/14/20 (from the past 24 hour(s))  Glucose, capillary     Status: Abnormal   Collection Time: 05/15/20  4:40 PM  Result Value Ref Range   Glucose-Capillary 448 (H) 70 - 99 mg/dL  Glucose, capillary     Status: Abnormal   Collection Time: 05/15/20  8:19 PM  Result Value Ref Range   Glucose-Capillary 485 (H) 70 - 99 mg/dL  Glucose, capillary     Status: Abnormal   Collection Time: 05/15/20 10:23 PM  Result Value Ref Range   Glucose-Capillary 346 (H) 70 - 99 mg/dL  Glucose, capillary     Status: Abnormal   Collection Time: 05/16/20 12:57 AM  Result Value Ref Range   Glucose-Capillary 276 (H) 70 - 99 mg/dL  Platelet count     Status: Abnormal   Collection Time: 05/16/20   4:33 AM  Result Value Ref Range   Platelets 139 (L) 150 - 400 K/uL  Glucose, capillary     Status: Abnormal   Collection Time: 05/16/20  8:42 AM  Result Value Ref Range   Glucose-Capillary 192 (H) 70 - 99 mg/dL  Glucose, capillary     Status: Abnormal   Collection Time: 05/16/20 11:30 AM  Result Value Ref Range   Glucose-Capillary 294 (H) 70 - 99 mg/dL  Glucose, capillary     Status: Abnormal   Collection Time: 05/16/20  4:17 PM  Result Value Ref Range   Glucose-Capillary 419 (H) 70 - 99 mg/dL    No results found.  Assessment/Plan:     Principal Problem:   Gout attack Active Problems:   Type 2 diabetes, controlled, with renal manifestation (HCC)   Chronic atrial fibrillation (HCC)   Essential hypertension   Hypokalemia   Chronic venous insufficiency   Chronic diastolic heart failure (HCC)   Cellulitis  Patient is not showing signs of a septic right elbow joint.  There is minimal swelling no erythema.  He has point tenderness over the aspiration site of the lateral right elbow but otherwise does not have tenderness.  His forearm compartments are soft  and compressible.  He is distally neurovascular intact.  Continue IV antibiotics.  Patient sees Dr. Posey Pronto in rheumatology at [facility]  I have placed a consult for rheumatology and sent a haiku message to request the Dr. Posey Pronto to see the patient while he is here in the hospital and make recommendations for gout treatment.  His uric acid is 7.6.  Patient's white blood cell count is 5.7, CRP is 15.3 and sed rate is 37.  Patient's blood sugars have been elevated throughout his hospital stay including a capillary blood glucose of 419 at 4 PM today.  His blood glucose on labs this morning was 295.       Thornton Park , MD 05/16/2020, 4:22 PM

## 2020-05-16 NOTE — Progress Notes (Signed)
Inpatient Diabetes Program Recommendations  AACE/ADA: New Consensus Statement on Inpatient Glycemic Control (2015)  Target Ranges:  Prepandial:   less than 140 mg/dL      Peak postprandial:   less than 180 mg/dL (1-2 hours)      Critically ill patients:  140 - 180 mg/dL   Results for EMRY, BARBATO (MRN [id number]) as of 05/16/2020 13:39  Ref. Range 05/15/2020 08:24 05/15/2020 11:47 05/15/2020 16:40 05/15/2020 20:19 05/15/2020 22:23  Glucose-Capillary Latest Ref Range: 70 - 99 mg/dL 224 (H)  5 units NOVOLOG  265 (H)  13 units NOVOLOG  448 (H)  20 units NOVOLOG  485 (H)  10 units NOVOLOG  346 (H)   40 units LANTUS   Results for JUELL, RADNEY (MRN [id number]) as of 05/16/2020 13:39  Ref. Range 05/16/2020 08:42 05/16/2020 11:30  Glucose-Capillary Latest Ref Range: 70 - 99 mg/dL 192 (H)  3 units NOVOLOG  294 (H)  8 units NOVOLOG     Home: Humalog 5-10 units TID  Lantus 40 units QHS  Farxiga 10 mg Daily  Current: Lantus 40 units QHS      Novolog 0-15 units TID ac + hs    MD- Please consider adding back Novolog Meal Coverage:  Novolog 5 units TID with meals  Hold if pt eats <50% of meal, Hold if pt NPO    --Will follow patient during hospitalization--  Wyn Quaker RN, MSN, CDE Diabetes Coordinator Inpatient Glycemic Control Team Team Pager: [phone] (8a-5p)

## 2020-05-16 NOTE — Progress Notes (Addendum)
PROGRESS NOTE    [name].  JAS:[id number] DOB: 02/11/1961 DOA: 05/14/2020 PCP: [name], MD    Brief Narrative:  [name]. is a 60 y.o. male with medical history significant for end-stage renal disease status post renal transplant, coronary artery disease status post CABG, history of A. fib on chronic anticoagulation therapy, chronic venous stasis involving both lower extremities, diabetes mellitus, GERD and sleep apnea who presents to the ER for evaluation of pain involving his right elbow, wrist and hand.  He rates his pain a 10 x 10 in intensity at its worst and any form of movement or touch worsens his pain.  He does have a history of gout and has had flareups in the past but none as bad as today.  Extremity is warm to touch with streaking over his right forearm. Uric acid nml.   He received a dose of Zosyn in the ER and admission was requested for concerns of possible right arm cellulitis. orthopedic   2/20-BG levels up. On steroid but candy by bedside. Told pt not eat candy.  2/221-BG levels elevated, 294-400's. Nephrology was consulted as pt's immunosuppresive meds documented is different than what pt is taking.  Addendum: increased novolog to 8 tid with meals, and lantus to 45 from 40 qhs   Consultants:   Orthopedics, nephrology  Procedures:   Antimicrobials:   keflex    Subjective: Able to open arm little more than yesterday, but not all the way. Overall improving.  Objective: Vitals:   05/15/20 2348 05/16/20 0534 05/16/20 0720 05/16/20 0807  BP: (!) 144/69 123/70 (!) 155/101 (!) 161/94  Pulse: (!) 53 (!) 54 (!) 44 60  Resp: _0 Temp: 97.6 F (36.4 C) 98 F (36.7 C) 98.3 F (36.8 C) 98 F (36.7 C)  TempSrc:   Oral   SpO2: 96% 98% 95% 100%  Weight:      Height:        Intake/Output Summary (Last 24 hours) at 05/16/2020 1241 Last data filed at 05/15/2020 1848 Gross per 24 hour  Intake 600 ml  Output --  Net 600 ml    Filed Weights   05/14/20 0515  Weight: 117.9 kg    Examination: Nad, calm cta no w/r/r rrr s1/s2 no gallop Soft benign +bs No edema. RUE, less erythema, still warm to touch, edema improving aaxox3 grossly intact    Data Reviewed: I have personally reviewed following labs and imaging studies  CBC: Recent Labs  Lab 05/14/20 0550 05/15/20 0411 05/16/20 0433  WBC 8.8 5.7  --   NEUTROABS 6.7  --   --   HGB 12.1* 12.0*  --   HCT 39.0 39.8  --   MCV 75.1* 76.0*  --   PLT 128* 125* 419*   Basic Metabolic Panel: Recent Labs  Lab 05/14/20 0550 05/15/20 0411  NA 135 136  K 3.1* 3.5  CL 96* 93*  CO2 29 33*  GLUCOSE 183* 295*  BUN 53* 58*  CREATININE 2.22* 2.06*  CALCIUM 8.6* 8.9  MG 1.7  --    GFR: Estimated Creatinine Clearance: 49.1 mL/min (A) (by C-G formula based on SCr of 2.06 mg/dL (H)). Liver Function Tests: No results for input(s): AST, ALT, ALKPHOS, BILITOT, PROT, ALBUMIN in the last 168 hours. No results for input(s): LIPASE, AMYLASE in the last 168 hours. No results for input(s): AMMONIA in the last 168 hours. Coagulation Profile: No results for input(s): INR, PROTIME in the  last 168 hours. Cardiac Enzymes: Recent Labs  Lab 05/14/20 0550  CKTOTAL 55   BNP (last 3 results) No results for input(s): PROBNP in the last 8760 hours. HbA1C: Recent Labs    05/14/20 1711  HGBA1C 8.7*   CBG: Recent Labs  Lab 05/15/20 2019 05/15/20 2223 05/16/20 0057 05/16/20 0842 05/16/20 1130  GLUCAP 485* 346* 276* 192* 294*   Lipid Profile: No results for input(s): CHOL, HDL, LDLCALC, TRIG, CHOLHDL, LDLDIRECT in the last 72 hours. Thyroid Function Tests: No results for input(s): TSH, T4TOTAL, FREET4, T3FREE, THYROIDAB in the last 72 hours. Anemia Panel: No results for input(s): VITAMINB12, FOLATE, FERRITIN, TIBC, IRON, RETICCTPCT in the last 72 hours. Sepsis Labs: Recent Labs  Lab 05/14/20 0550  LATICACIDVEN 1.5    Recent Results (from the past 240  hour(s))  Blood culture (routine x 2)     Status: None (Preliminary result)   Collection Time: 05/14/20  7:50 AM   Specimen: BLOOD  Result Value Ref Range Status   Specimen Description BLOOD RIGHT ARM  Final   Special Requests   Final    BOTTLES DRAWN AEROBIC AND ANAEROBIC Blood Culture results may not be optimal due to an inadequate volume of blood received in culture bottles   Culture   Final    NO GROWTH 2 DAYS Performed at [facility], [address], [place], [place] [address]    Report Status PENDING  Incomplete  Blood culture (routine x 2)     Status: None (Preliminary result)   Collection Time: 05/14/20  7:51 AM   Specimen: BLOOD  Result Value Ref Range Status   Specimen Description BLOOD BLOOD RIGHT FOREARM  Final   Special Requests   Final    BOTTLES DRAWN AEROBIC AND ANAEROBIC Blood Culture adequate volume   Culture   Final    NO GROWTH 2 DAYS Performed at [facility], [address] [place], [place] [address]    Report Status PENDING  Incomplete  SARS CORONAVIRUS 2 (TAT 6-24 HRS) Nasopharyngeal Nasopharyngeal Swab     Status: None   Collection Time: 05/14/20 11:36 AM   Specimen: Nasopharyngeal Swab  Result Value Ref Range Status   SARS Coronavirus 2 NEGATIVE NEGATIVE Final    Comment: (NOTE) SARS-CoV-2 target nucleic acids are NOT DETECTED.  The SARS-CoV-2 RNA is generally detectable in upper and lower respiratory specimens during the acute phase of infection. Negative results do not preclude SARS-CoV-2 infection, do not rule out co-infections with other pathogens, and should not be used as the sole basis for treatment or other patient management decisions. Negative results must be combined with clinical observations, patient history, and epidemiological information. The expected result is Negative.  Fact Sheet for Patients: SugarRoll.be  Fact Sheet for Healthcare  Providers: https://www.woods-mathews.com/  This test is not yet approved or cleared by the [place] FDA and  has been authorized for detection and/or diagnosis of SARS-CoV-2 by FDA under an Emergency Use Authorization (EUA). This EUA will remain  in effect (meaning this test can be used) for the duration of the COVID-19 declaration under Se ction 564(b)(1) of the Act, 21 U.S.C. section 360bbb-3(b)(1), unless the authorization is terminated or revoked sooner.  Performed at [facility], [place] [address] [place], [place] [address]          Radiology Studies: DG ELBOW COMPLETE RIGHT (3+VIEW)  Result Date: 05/14/2020 CLINICAL DATA:  Severe right elbow pain. History of gout. No known injury. EXAM: RIGHT ELBOW -  COMPLETE 3+ VIEW COMPARISON:  None. FINDINGS: No acute bony or joint abnormality. No focal lesion. Small to moderate elbow joint effusion. Vascular stents noted. IMPRESSION: Small to moderate elbow joint effusion. The exam is otherwise negative. Electronically Signed   By: Inge Rise M.D.   On: 05/14/2020 16:31   DG Wrist Complete Right  Result Date: 05/14/2020 CLINICAL DATA:  60 year old male with right wrist pain. EXAM: RIGHT WRIST - COMPLETE 3+ VIEW COMPARISON:  None. FINDINGS: There is no acute fracture or dislocation. The bones are mildly osteopenic. No significant arthritic changes. Mild soft tissue swelling. Vascular calcifications noted. IMPRESSION: No acute fracture or dislocation. Electronically Signed   By: Anner Crete M.D.   On: 05/14/2020 16:31        Scheduled Meds: . apixaban  5 mg Oral BID  . vitamin C  250 mg Oral Daily  . buPROPion  150 mg Oral BID  . colchicine  0.3 mg Oral QODAY  . febuxostat  40 mg Oral Daily  . fluticasone  2 spray Each Nare Daily  . gabapentin  300 mg Oral BID  . insulin aspart  0-15 Units Subcutaneous TID WC  . insulin aspart  0-5 Units Subcutaneous QHS  . insulin glargine  40 Units Subcutaneous QHS   . iron polysaccharides  150 mg Oral Daily  . lidocaine  1 patch Transdermal Q24H  . magnesium oxide  400 mg Oral Daily  . metolazone  5 mg Oral Once per day on Mon Thu  . montelukast  10 mg Oral QHS  . multivitamin with minerals  1 tablet Oral Daily  . mycophenolate  360 mg Oral BID  . pantoprazole  40 mg Oral Daily  . potassium chloride  30 mEq Oral BID  . predniSONE  40 mg Oral Q breakfast  . rosuvastatin  10 mg Oral Daily  . sodium chloride flush  3 mL Intravenous Q12H  . tacrolimus  2 mg Oral QHS  . tacrolimus  3 mg Oral q AM  . tamsulosin  0.4 mg Oral Daily  . torsemide  100 mg Oral BID  . zinc sulfate  220 mg Oral BID   Continuous Infusions: . sodium chloride 250 mL (05/15/20 0435)  .  ceFAZolin (ANCEF) IV 2 g (05/16/20 1023)    Assessment & Plan:   Principal Problem:   Gout attack Active Problems:   Type 2 diabetes, controlled, with renal manifestation (HCC)   Chronic atrial fibrillation (HCC)   Essential hypertension   Hypokalemia   Chronic venous insufficiency   Chronic diastolic heart failure (HCC)   Cellulitis   Acute gout flare v.s. rule out right arm cellulitis  Patient with a history of gout who presents to the ER for evaluation of severe pain involving the right elbow, right wrist and hand. Patient noted to have differential warmth involving the right forearm and streaking over the right forearm concerning for possible cellulitis 2/22-clinically improving  More today. Able to open arm more.  Still with edema and warmth. Less erythema.  Likely gout , but cannot rule out cellulitis as he is getting treatment for both and he is improving. Neurovascular intact. Ortho following Will continue iv abx Was on iv steroid, now on po Uric acid nml Continue colchicine, and Uloric No surgical intervention Will need po abx on discharge     Chronic atrial fibrillation Continue Eliquis for primary ppx for actue stroke    Diabetes mellitus with  complications of end-stage renal disease status post renal  transplant Maintain consistent carbohydrate diet 2/19-pt was eating candy in room, counseled not eat sweets as his bg elevated here especially on steroid. 2/22-bg elevated due to eating sugary candies, coke and steroids. Will give lantus 5U this am. Will increase novolog to 8 units tid with meals. Nephrology's input was apprciated-Currently mycophenolate (Myfortic) 360 mg twice a day Latest transplant note from February 18 states Prograf dose 2 mg in a.m. and 1.5 mg in the afternoon. Doses adjusted per nephrology   Chronic diastolic dysfunction CHF Euvolemic without exacerbation Continue metolazone and torsemide      Depression Continue wellbutrin   Hypokalemia/hypomagnesemia Related to diuretic therapy Supplemented potassium and magnesium Stable.   BPH Continue Flomax   DVT prophylaxis: Eliquis Code Status: Full Family Communication: None at bedside  Status is: Inpatient  Remains inpatient appropriate because:Inpatient level of care appropriate due to severity of illness   Dispo: The patient is from: Home              Anticipated d/c is to: Home              Anticipated d/c date is: 1 day              Patient currently is not medically stable to d/c.continue iv abx, blood glucose high 294-400's, will need to control better prior to dc, likely dc in am on po abx.   Difficult to place patient No            LOS: 2 days   Time spent: 35 minutes with more than 50% on [place], MD [facility] Pager [phone]  If 7PM-7AM, please contact night-coverage 05/16/2020, 12:41 PM

## 2020-05-16 NOTE — Progress Notes (Signed)
[facility] [place], [place] 05/16/20  Subjective:   Hospital day # 2  Patient known to our practice from previous admission This time presents for acute exacerbation of joint pain in the right elbow and right wrist.  Diagnosed with acute gout vs cellulitis and admitted for further evaluation.  Nephrology consult requested to evaluate transplant regimen and follow along.  Patient seen today sitting in chair States right elbow and wrist pain have improved Limited ROM  Denies shortness of breath and chest pain  Objective:  Vital signs in last 24 hours:  Temp:  [97.6 F (36.4 C)-98.3 F (36.8 C)] 98 F (36.7 C) (02/22 0807) Pulse Rate:  [44-64] 60 (02/22 0807) Resp:  [16-18] 16 (02/22 0807) BP: (96-161)/(47-101) 161/94 (02/22 0807) SpO2:  [93 %-100 %] 100 % (02/22 0807)  Weight change:  Filed Weights   05/14/20 0515  Weight: 117.9 kg    Intake/Output:    Intake/Output Summary (Last 24 hours) at 05/16/2020 1219 Last data filed at 05/15/2020 1848 Gross per 24 hour  Intake 600 ml  Output --  Net 600 ml    Physical Exam: General:  No acute distress, sitting up in chair  HEENT  anicteric, moist oral mucous membrane  Pulm/lungs  normal breathing effort, lungs are clear  CVS/Heart  regular rhythm, no rub or gallop  Abdomen:   Soft, nontender  Extremities:  + peripheral edema  Neurologic:  Alert, oriented, able to follow commands  Skin:  No acute rashes  Right elbow and right wrist swelling and edema    Basic Metabolic Panel:  Recent Labs  Lab 05/14/20 0550 05/15/20 0411  NA 135 136  K 3.1* 3.5  CL 96* 93*  CO2 29 33*  GLUCOSE 183* 295*  BUN 53* 58*  CREATININE 2.22* 2.06*  CALCIUM 8.6* 8.9  MG 1.7  --      CBC: Recent Labs  Lab 05/14/20 0550 05/15/20 0411 05/16/20 0433  WBC 8.8 5.7  --   NEUTROABS 6.7  --   --   HGB 12.1* 12.0*  --   HCT 39.0 39.8  --   MCV 75.1* 76.0*  --   PLT 128* 125* 139*     No results found for:  HEPBSAG, HEPBSAB, HEPBIGM    Microbiology:  Recent Results (from the past 240 hour(s))  Blood culture (routine x 2)     Status: None (Preliminary result)   Collection Time: 05/14/20  7:50 AM   Specimen: BLOOD  Result Value Ref Range Status   Specimen Description BLOOD RIGHT ARM  Final   Special Requests   Final    BOTTLES DRAWN AEROBIC AND ANAEROBIC Blood Culture results may not be optimal due to an inadequate volume of blood received in culture bottles   Culture   Final    NO GROWTH 2 DAYS Performed at [facility], [address] [place], [place] [address]    Report Status PENDING  Incomplete  Blood culture (routine x 2)     Status: None (Preliminary result)   Collection Time: 05/14/20  7:51 AM   Specimen: BLOOD  Result Value Ref Range Status   Specimen Description BLOOD BLOOD RIGHT FOREARM  Final   Special Requests   Final    BOTTLES DRAWN AEROBIC AND ANAEROBIC Blood Culture adequate volume   Culture   Final    NO GROWTH 2 DAYS Performed at [facility], [address], [place], [place] [address]    Report Status PENDING  Incomplete  SARS CORONAVIRUS 2 (TAT 6-24 HRS) Nasopharyngeal Nasopharyngeal Swab     Status: None   Collection Time: 05/14/20 11:36 AM   Specimen: Nasopharyngeal Swab  Result Value Ref Range Status   SARS Coronavirus 2 NEGATIVE NEGATIVE Final    Comment: (NOTE) SARS-CoV-2 target nucleic acids are NOT DETECTED.  The SARS-CoV-2 RNA is generally detectable in upper and lower respiratory specimens during the acute phase of infection. Negative results do not preclude SARS-CoV-2 infection, do not rule out co-infections with other pathogens, and should not be used as the sole basis for treatment or other patient management decisions. Negative results must be combined with clinical observations, patient history, and epidemiological information. The expected result is Negative.  Fact Sheet for  Patients: HairSlick.no  Fact Sheet for Healthcare Providers: quierodirigir.com  This test is not yet approved or cleared by the [place] FDA and  has been authorized for detection and/or diagnosis of SARS-CoV-2 by FDA under an Emergency Use Authorization (EUA). This EUA will remain  in effect (meaning this test can be used) for the duration of the COVID-19 declaration under Se ction 564(b)(1) of the Act, 21 U.S.C. section 360bbb-3(b)(1), unless the authorization is terminated or revoked sooner.  Performed at [facility], [address] [place], [place] [address]     Coagulation Studies: No results for input(s): LABPROT, INR in the last 72 hours.  Urinalysis: No results for input(s): COLORURINE, LABSPEC, PHURINE, GLUCOSEU, HGBUR, BILIRUBINUR, KETONESUR, PROTEINUR, UROBILINOGEN, NITRITE, LEUKOCYTESUR in the last 72 hours.  Invalid input(s): APPERANCEUR    Imaging: DG ELBOW COMPLETE RIGHT (3+VIEW)  Result Date: 05/14/2020 CLINICAL DATA:  Severe right elbow pain. History of gout. No known injury. EXAM: RIGHT ELBOW - COMPLETE 3+ VIEW COMPARISON:  None. FINDINGS: No acute bony or joint abnormality. No focal lesion. Small to moderate elbow joint effusion. Vascular stents noted. IMPRESSION: Small to moderate elbow joint effusion. The exam is otherwise negative. Electronically Signed   By: Drusilla Kanner M.D.   On: 05/14/2020 16:31   DG Wrist Complete Right  Result Date: 05/14/2020 CLINICAL DATA:  60 year old male with right wrist pain. EXAM: RIGHT WRIST - COMPLETE 3+ VIEW COMPARISON:  None. FINDINGS: There is no acute fracture or dislocation. The bones are mildly osteopenic. No significant arthritic changes. Mild soft tissue swelling. Vascular calcifications noted. IMPRESSION: No acute fracture or dislocation. Electronically Signed   By: Elgie Collard M.D.   On: 05/14/2020 16:31     Medications:   . sodium  chloride 250 mL (05/15/20 0435)  .  ceFAZolin (ANCEF) IV 2 g (05/16/20 1023)   . apixaban  5 mg Oral BID  . vitamin C  250 mg Oral Daily  . buPROPion  150 mg Oral BID  . colchicine  0.3 mg Oral QODAY  . febuxostat  40 mg Oral Daily  . fluticasone  2 spray Each Nare Daily  . gabapentin  300 mg Oral BID  . insulin aspart  0-15 Units Subcutaneous TID WC  . insulin aspart  0-5 Units Subcutaneous QHS  . insulin glargine  40 Units Subcutaneous QHS  . iron polysaccharides  150 mg Oral Daily  . lidocaine  1 patch Transdermal Q24H  . magnesium oxide  400 mg Oral Daily  . metolazone  5 mg Oral Once per day on Mon Thu  . montelukast  10 mg Oral QHS  . multivitamin with minerals  1 tablet Oral Daily  . mycophenolate  360 mg Oral BID  . pantoprazole  40 mg Oral Daily  . potassium chloride  30 mEq Oral BID  . predniSONE  40 mg Oral Q breakfast  . rosuvastatin  10 mg Oral Daily  . sodium chloride flush  3 mL Intravenous Q12H  . tacrolimus  2 mg Oral QHS  . tacrolimus  3 mg Oral q AM  . tamsulosin  0.4 mg Oral Daily  . torsemide  100 mg Oral BID  . zinc sulfate  220 mg Oral BID   sodium chloride, acetaminophen **OR** acetaminophen, HYDROcodone-acetaminophen, ipratropium-albuterol, ondansetron **OR** ondansetron (ZOFRAN) IV, sodium chloride flush  Assessment/ Plan:  60 y.o. male with  Diabetes, hypertension, Coronary disease, CABG, deceased donor renal transplant 09/13/2010, Gastric sleeve surgery 03/14/2014  admitted on 05/14/2020 for Cellulitis [L03.90] Elbow pain [M25.529] Wrist pain [M25.539] Cellulitis of right upper extremity [L03.113]  Renal transplant status CKD stage IIIB with diabetic nephropathy-biopsy 2017  immunosuppression induction with Campath, now maintenance with tacrolimus and mycophenolate. 1 month allograft biopsy July 2012- recovering ATN with early tubular atrophy, interstitial fibrosis. Repeat biopsy March 2017 for increasing proteinuria- no acute rejection, showed  diabetic nephropathy, FSGS  Followed by transplant center [facility] in [place].  Last seen on February 15. - Continue home dose of immunosuppression Currently mycophenolate (Myfortic) 360 mg twice a day Latest transplant note from February 18 states Prograf dose 2 mg in a.m. and 1.5 mg in the afternoon.  We will adjust the orders.   Pulmonary hypertension, lower extremity edema Aggressive diuretic regimen of torsemide 100 mg twice a day Improved edema Follow strict low-sodium diet Dose of torsemide may need to be adjusted.    DM-2 with CKD Lab Results  Component Value Date   HGBA1C 8.7 (H) 05/14/2020   Encouraged stricter diet compliance    LOS: 2 Wendee Beavers 2/22/202212:19 PM  [facility] [place], [place] [phone]  Note: This note was prepared with Dragon dictation. Any transcription errors are unintentional

## 2020-05-17 ENCOUNTER — Other Ambulatory Visit: Payer: Self-pay | Admitting: Internal Medicine

## 2020-05-17 DIAGNOSIS — M1000 Idiopathic gout, unspecified site: Secondary | ICD-10-CM | POA: Diagnosis not present

## 2020-05-17 LAB — BASIC METABOLIC PANEL
Anion gap: 12 (ref 5–15)
Anion gap: 13 (ref 5–15)
BUN: 86 mg/dL — ABNORMAL HIGH (ref 6–20)
BUN: 88 mg/dL — ABNORMAL HIGH (ref 6–20)
CO2: 37 mmol/L — ABNORMAL HIGH (ref 22–32)
CO2: 37 mmol/L — ABNORMAL HIGH (ref 22–32)
Calcium: 9.2 mg/dL (ref 8.9–10.3)
Calcium: 9.6 mg/dL (ref 8.9–10.3)
Chloride: 89 mmol/L — ABNORMAL LOW (ref 98–111)
Chloride: 89 mmol/L — ABNORMAL LOW (ref 98–111)
Creatinine, Ser: 1.95 mg/dL — ABNORMAL HIGH (ref 0.61–1.24)
Creatinine, Ser: 1.95 mg/dL — ABNORMAL HIGH (ref 0.61–1.24)
GFR, Estimated: 39 mL/min — ABNORMAL LOW (ref >60)
GFR, Estimated: 39 mL/min — ABNORMAL LOW (ref >60)
Glucose, Bld: 199 mg/dL — ABNORMAL HIGH (ref 70–99)
Glucose, Bld: 127 mg/dL — ABNORMAL HIGH (ref 70–99)
Potassium: 2.6 mmol/L — CL (ref 3.5–5.1)
Potassium: 3.1 mmol/L — ABNORMAL LOW (ref 3.5–5.1)
Sodium: 138 mmol/L (ref 135–145)
Sodium: 139 mmol/L (ref 135–145)

## 2020-05-17 LAB — CBC WITH DIFFERENTIAL/PLATELET
Abs Immature Granulocytes: 0.09 10*3/uL — ABNORMAL HIGH (ref 0.00–0.07)
Basophils Absolute: 0 10*3/uL (ref 0.0–0.1)
Basophils Relative: 0 %
Eosinophils Absolute: 0 10*3/uL (ref 0.0–0.5)
Eosinophils Relative: 0 %
HCT: 43.5 % (ref 39.0–52.0)
Hemoglobin: 13.6 g/dL (ref 13.0–17.0)
Immature Granulocytes: 1 %
Lymphocytes Relative: 20 %
Lymphs Abs: 1.5 10*3/uL (ref 0.7–4.0)
MCH: 23.2 pg — ABNORMAL LOW (ref 26.0–34.0)
MCHC: 31.3 g/dL (ref 30.0–36.0)
MCV: 74.1 fL — ABNORMAL LOW (ref 80.0–100.0)
Monocytes Absolute: 0.6 10*3/uL (ref 0.1–1.0)
Monocytes Relative: 8 %
Neutro Abs: 5 10*3/uL (ref 1.7–7.7)
Neutrophils Relative %: 71 %
Platelets: 189 10*3/uL (ref 150–400)
RBC: 5.87 MIL/uL — ABNORMAL HIGH (ref 4.22–5.81)
RDW: 17.5 % — ABNORMAL HIGH (ref 11.5–15.5)
WBC: 7.2 10*3/uL (ref 4.0–10.5)
nRBC: 0 % (ref 0.0–0.2)

## 2020-05-17 LAB — GLUCOSE, CAPILLARY
Glucose-Capillary: 134 mg/dL — ABNORMAL HIGH (ref 70–99)
Glucose-Capillary: 247 mg/dL — ABNORMAL HIGH (ref 70–99)
Glucose-Capillary: 333 mg/dL — ABNORMAL HIGH (ref 70–99)

## 2020-05-17 LAB — SEDIMENTATION RATE: Sed Rate: 29 mm/hr — ABNORMAL HIGH (ref 0–20)

## 2020-05-17 LAB — C-REACTIVE PROTEIN: CRP: 6.6 mg/dL — ABNORMAL HIGH (ref <1.0)

## 2020-05-17 MED ORDER — CEFDINIR 300 MG PO CAPS: 300.0000 mg | ORAL_CAPSULE | Freq: Two times a day (BID) | ORAL | 0 refills | Status: DC

## 2020-05-17 MED ORDER — POTASSIUM CHLORIDE CRYS ER 20 MEQ PO TBCR
40.0000 meq | EXTENDED_RELEASE_TABLET | Freq: Once | ORAL | Status: AC
  Administered 2020-05-17: 40 meq via ORAL
  Filled 2020-05-17: qty 2

## 2020-05-17 MED ORDER — PREDNISONE 20 MG PO TABS: 20.0000 mg | ORAL_TABLET | Freq: Every day | ORAL | 0 refills | Status: DC

## 2020-05-17 MED ORDER — OXYCODONE HCL 5 MG PO TABS
5.0000 mg | ORAL_TABLET | Freq: Three times a day (TID) | ORAL | 0 refills | Status: DC | PRN
Start: 2020-05-17 — End: 2020-05-17

## 2020-05-17 MED ORDER — TORSEMIDE 40 MG PO TABS: 40.0000 mg | ORAL_TABLET | Freq: Every day | ORAL | 0 refills | Status: DC

## 2020-05-17 MED ORDER — TORSEMIDE 20 MG PO TABS: 40.0000 mg | ORAL_TABLET | Freq: Every day | ORAL | Status: DC

## 2020-05-17 MED ORDER — POTASSIUM CHLORIDE 10 MEQ/100ML IV SOLN
10.0000 meq | INTRAVENOUS | Status: DC
  Administered 2020-05-17: 10 meq via INTRAVENOUS
  Filled 2020-05-17: qty 100

## 2020-05-17 MED ORDER — POTASSIUM CHLORIDE CRYS ER 20 MEQ PO TBCR
40.0000 meq | EXTENDED_RELEASE_TABLET | Freq: Two times a day (BID) | ORAL | Status: DC
  Administered 2020-05-17: 10:00:00 40 meq via ORAL
  Filled 2020-05-17: qty 2

## 2020-05-17 NOTE — Discharge Summary (Signed)
Physician Discharge Summary  [name]. ATF:[id number] DOB: [birth year] DOA: 05/14/2020  PCP: [name], MD  Admit date: 05/14/2020 Discharge date: 05/17/2020  Admitted From: Home Disposition:  Home  Recommendations for Outpatient Follow-up:  1. Follow up with PCP in 1-2 weeks 2. Follow-up with rheumatology 1 to 2 weeks  Home Health: No Equipment/Devices: None Discharge Condition: Stable CODE STATUS: Full Diet recommendation: Carb modified  Brief/Interim Summary: Carl Beckis a 60 y.o.malewith medical history significant forend-stage renal disease status post renal transplant, coronary artery disease status post CABG, history of A. fib on chronic anticoagulation therapy, chronic venous stasis involving both lower extremities, diabetes mellitus, GERD and sleep apnea who presents to the ER for evaluation of pain involving his right elbow, wrist and hand. He rates his pain a 10 x 10 in intensity at its worst and any form of movement or touch worsens his pain. He does have a history of gout and has had flareups in the past but none as bad as today. Extremity iswarm to touch with streaking over his right forearm. Uric acid nml. He received a dose of Zosyn in the ER and admission was requested for concerns of possible right arm cellulitis.   Orthopedics consulted after failed aspiration attempt in ED.  Patient's physical exam and mobility in the right upper extremity did improve substantially.  On day of discharge cellulitic changes is nearly resolved.  Range of motion and pain control markedly improved.  Stable for discharge home at this time.  Will discharge on 5 days of p.o. prednisone.  Patient already on Uloric and colchicine at home, can continue home regimen.  Also touch base with nephrology.  Will elect to decrease torsemide dose from 100 mg to 40 mg for the next 5 days.  Patient will follow up with PCP and rheumatology within a week of  discharge.   Discharge Diagnoses:  Principal Problem:   Gout attack Active Problems:   Type 2 diabetes, controlled, with renal manifestation (HCC)   Chronic atrial fibrillation (HCC)   Essential hypertension   Hypokalemia   Chronic venous insufficiency   Chronic diastolic heart failure (HCC)   Cellulitis  Acute gout flare, right elbow Likely concomitant right arm cellulitis Known history of gout who presented to ED for evaluation of severe pain in right elbow hand and wrist.  Physical exam and inflammatory markers did improve during course of admission.  Grip strength and range of motion almost to baseline at time of discharge.  Stable for discharge home at this time.  We will continue colchicine and Uloric per home regimen.  5 days prednisone 20 mg daily prescribed on discharge.  Empiric antibiotics also prescribed.  Patient will follow up with PCP and rheumatology within a week of discharge.  Discharge Instructions  Discharge Instructions    Diet - low sodium heart healthy   Complete by: As directed    Increase activity slowly   Complete by: As directed      Allergies as of 05/17/2020      Reactions   Contrast Media [iodinated Diagnostic Agents]    Kidney transplant   Ibuprofen Other (See Comments)   Due to kidney transplant Due to kidney transplant Due to kidney transplant      Medication List    TAKE these medications   beclomethasone 80 MCG/ACT inhaler Commonly known as: QVAR Inhale 2 puffs into the lungs 2 (two) times daily.   cefdinir 300 MG capsule Commonly known as: OMNICEF Take  1 capsule (300 mg total) by mouth 2 (two) times daily for 4 days. Start taking on: May 18, 2020   colchicine 0.6 MG tablet Take 0.6 mg by mouth daily as needed.   dapagliflozin propanediol 10 MG Tabs tablet Commonly known as: Farxiga Take 1 tablet (10 mg total) by mouth daily before breakfast.   diclofenac Sodium 1 % Gel Commonly known as: Voltaren Apply 2 g topically 3  (three) times daily as needed.   Eliquis 5 MG Tabs tablet Generic drug: apixaban TAKE 1 TABLET BY MOUTH TWICE DAILY   febuxostat 40 MG tablet Commonly known as: ULORIC Take 40 mg by mouth daily.   Ferrex 150 150 MG capsule Generic drug: iron polysaccharides Take 150 mg by mouth daily.   fluticasone 50 MCG/ACT nasal spray Commonly known as: FLONASE PLACE 2 SPRAYS INTO BOTH NOSTRILS DAILY.   gabapentin 300 MG capsule Commonly known as: NEURONTIN Take 300 mg by mouth 2 (two) times daily.   HumaLOG KwikPen 100 UNIT/ML KwikPen Generic drug: insulin lispro Inject 5-10 Units into the skin 3 (three) times daily. Sliding scale   insulin glargine 100 unit/mL Sopn Commonly known as: LANTUS Inject 40 Units into the skin at bedtime.   lidocaine 5 % Commonly known as: Lidoderm Place 1 patch onto the skin every 12 (twelve) hours. Remove & Discard patch within 12 hours or as directed by MD   metolazone 5 MG tablet Commonly known as: ZAROXOLYN Take 1 tablet (5 mg total) by mouth 2 (two) times a week. On tuesdays Sundays   mometasone 50 MCG/ACT nasal spray Commonly known as: NASONEX Place 2 sprays into the nose daily as needed.   montelukast 10 MG tablet Commonly known as: SINGULAIR TAKE 1 TABLET BY MOUTH AT BEDTIME.   multivitamin with minerals Tabs tablet Take 1 tablet by mouth daily.   mycophenolate 180 MG EC tablet Commonly known as: MYFORTIC Take 360 mg by mouth 2 (two) times daily.   omeprazole 20 MG capsule Commonly known as: PRILOSEC Take 1 capsule (20 mg total) by mouth daily.   oxyCODONE 5 MG immediate release tablet Commonly known as: Roxicodone Take 1 tablet (5 mg total) by mouth every 8 (eight) hours as needed for up to 4 days.   potassium chloride 10 MEQ CR capsule Commonly known as: MICRO-K Take 6 capsules (60 mEq total) by mouth 2 (two) times daily. Take an extra tab on Tue and Fri with metolazone   predniSONE 20 MG tablet Commonly known as:  DELTASONE Take 1 tablet (20 mg total) by mouth daily with breakfast for 5 days. Start taking on: May 18, 2020   rosuvastatin 10 MG tablet Commonly known as: CRESTOR TAKE 1 TABLET BY MOUTH DAILY   tacrolimus 1 MG capsule Commonly known as: PROGRAF 3 mg in AM and 73m in PM   tadalafil (PAH) 20 MG tablet Commonly known as: ADCIRCA Take 1 tablet (20 mg total) by mouth every other day.   tamsulosin 0.4 MG Caps capsule Commonly known as: FLOMAX Take 0.4 mg by mouth daily.   Torsemide 40 MG Tabs Take 40 mg by mouth daily for 7 days. Take 454mdaily x 5 days.  Can resume previous regimen after that date or sooner if you are building up fluid Start taking on: May 18, 2020 What changed:   medication strength  how much to take  when to take this  additional instructions   vitamin C 250 MG tablet Commonly known as: ASCORBIC ACID Take 1 tablet (  250 mg total) by mouth daily.   zinc sulfate 220 (50 Zn) MG capsule Take 1 capsule (220 mg total) by mouth 2 (two) times daily.       Follow-up Information    [name], MD. Schedule an appointment as soon as possible for a visit in 1 week(s).   Specialties: Internal Medicine, Pediatrics Contact information: [place] [address] [phone]        [name], MD .   Specialty: Cardiology Contact information: [place] [address] [phone]        [name], MD .   Specialty: Cardiology Contact information: [address] [place] [address] [phone]        Quintin Alto, MD. Schedule an appointment as soon as possible for a visit in 1 week(s).   Specialty: Rheumatology Contact information: [place] [address] [phone]              Allergies  Allergen Reactions  . Contrast Media [Iodinated Diagnostic Agents]     Kidney transplant  . Ibuprofen Other (See Comments)    Due to  kidney transplant Due to kidney transplant Due to kidney transplant    Consultations:   orthopedics   Procedures/Studies: DG ELBOW COMPLETE RIGHT (3+VIEW)  Result Date: 05/14/2020 CLINICAL DATA:  Severe right elbow pain. History of gout. No known injury. EXAM: RIGHT ELBOW - COMPLETE 3+ VIEW COMPARISON:  None. FINDINGS: No acute bony or joint abnormality. No focal lesion. Small to moderate elbow joint effusion. Vascular stents noted. IMPRESSION: Small to moderate elbow joint effusion. The exam is otherwise negative. Electronically Signed   By: Inge Rise M.D.   On: 05/14/2020 16:31   DG Wrist Complete Right  Result Date: 05/14/2020 CLINICAL DATA:  60 year old male with right wrist pain. EXAM: RIGHT WRIST - COMPLETE 3+ VIEW COMPARISON:  None. FINDINGS: There is no acute fracture or dislocation. The bones are mildly osteopenic. No significant arthritic changes. Mild soft tissue swelling. Vascular calcifications noted. IMPRESSION: No acute fracture or dislocation. Electronically Signed   By: Anner Crete M.D.   On: 05/14/2020 16:31   US Venous Img Lower Unilateral Right  Result Date: 05/12/2020 EXAM: RIGHT LOWER EXTREMITY VENOUS DOPPLER ULTRASOUND TECHNIQUE: Gray-scale sonography with compression, as well as color and duplex ultrasound, were performed to evaluate the deep venous system(s) from the level of the common femoral vein through the popliteal and proximal calf veins. COMPARISON:  05/01/2019 FINDINGS: VENOUS Normal compressibility of the common femoral, superficial femoral, and popliteal veins, as well as the visualized calf veins. Visualized portions of profunda femoral vein and great saphenous vein unremarkable. No filling defects to suggest DVT on grayscale or color Doppler imaging. Doppler waveforms show normal direction of venous flow, normal respiratory plasticity and response to augmentation. Limited views of the contralateral common femoral vein are unremarkable. OTHER  None. Limitations: none IMPRESSION: 1. No right lower extremity DVT. 2. Targeted sonographic evaluation of the area of concern in the right proximal medial shin demonstrates a heterogeneous hypoechoic structure measuring 2.5 x 1.5 x 0.7 cm. This is most likely due to a hematoma. A follow-up ultrasound should be performed in 4-6 weeks to document resolution. Electronically Signed   By: Miachel Roux M.D.   On: 05/12/2020 08:41    (Echo, Carotid, EGD, Colonoscopy, ERCP)    Subjective: Patient seen and examined the day of discharge.  Stable, no distress, pain improved  but not quite at baseline, stable for discharge home at this time.  Follow-up with outpatient PCP and orthopedics.  Discharge Exam: Vitals:   05/17/20 0737 05/17/20 1100  BP: (!) 157/92 (!) 146/88  Pulse: (!) 50 61  Resp: 18 18  Temp: 97.8 F (36.6 C) 97.8 F (36.6 C)  SpO2: 95% 95%   Vitals:   05/16/20 2101 05/17/20 0102 05/17/20 0737 05/17/20 1100  BP: 135/83 (!) 146/60 (!) 157/92 (!) 146/88  Pulse: 67 (!) 54 (!) 50 61  Resp: _0 Temp: 98.2 F (36.8 C) 97.6 F (36.4 C) 97.8 F (36.6 C) 97.8 F (36.6 C)  TempSrc: Oral  Oral Oral  SpO2: 100% 96% 95% 95%  Weight:      Height:        General: [name] is alert, awake, not in acute distress Cardiovascular: RRR, S1/S2 +, no rubs, no gallops Respiratory: CTA bilaterally, no wheezing, no rhonchi Abdominal: Soft, NT, ND, bowel sounds + Extremities: Right upper extremity swollen, tender to touch, decreased range of motion.  Decreased from prior    The results of significant diagnostics from this hospitalization (including imaging, microbiology, ancillary and laboratory) are listed below for reference.     Microbiology: Recent Results (from the past 240 hour(s))  Blood culture (routine x 2)     Status: None (Preliminary result)   Collection Time: 05/14/20  7:50 AM   Specimen: BLOOD  Result Value Ref Range Status   Specimen Description BLOOD RIGHT ARM  Final    Special Requests   Final    BOTTLES DRAWN AEROBIC AND ANAEROBIC Blood Culture results may not be optimal due to an inadequate volume of blood received in culture bottles   Culture   Final    NO GROWTH 3 DAYS Performed at [facility], [address], [place], [place] [address]    Report Status PENDING  Incomplete  Blood culture (routine x 2)     Status: None (Preliminary result)   Collection Time: 05/14/20  7:51 AM   Specimen: BLOOD  Result Value Ref Range Status   Specimen Description BLOOD BLOOD RIGHT FOREARM  Final   Special Requests   Final    BOTTLES DRAWN AEROBIC AND ANAEROBIC Blood Culture adequate volume   Culture   Final    NO GROWTH 3 DAYS Performed at [facility], [address], [place], [place] [address]    Report Status PENDING  Incomplete  SARS CORONAVIRUS 2 (TAT 6-24 HRS) Nasopharyngeal Nasopharyngeal Swab     Status: None   Collection Time: 05/14/20 11:36 AM   Specimen: Nasopharyngeal Swab  Result Value Ref Range Status   SARS Coronavirus 2 NEGATIVE NEGATIVE Final    Comment: (NOTE) SARS-CoV-2 target nucleic acids are NOT DETECTED.  The SARS-CoV-2 RNA is generally detectable in upper and lower respiratory specimens during the acute phase of infection. Negative results do not preclude SARS-CoV-2 infection, do not rule out co-infections with other pathogens, and should not be used as the sole basis for treatment or other patient management decisions. Negative results must be combined with clinical observations, patient history, and epidemiological information. The expected result is Negative.  Fact Sheet for Patients: SugarRoll.be  Fact Sheet for Healthcare Providers: https://www.woods-mathews.com/  This test is not yet approved or cleared by the [place] FDA and  has been authorized for detection and/or diagnosis of SARS-CoV-2 by FDA under an Emergency Use Authorization (EUA). This EUA will  remain  in effect (meaning this  test can be used) for the duration of the COVID-19 declaration under Se ction 564(b)(1) of the Act, 21 U.S.C. section 360bbb-3(b)(1), unless the authorization is terminated or revoked sooner.  Performed at [facility], [place] [address], [place], [place] [address]      Labs: BNP (last 3 results) Recent Labs    06/01/19 1130  BNP 950.9*   Basic Metabolic Panel: Recent Labs  Lab 05/14/20 0550 05/15/20 0411 05/17/20 0412 05/17/20 0726  NA 135 136 138 139  K 3.1* 3.5 2.6* 3.1*  CL 96* 93* 89* 89*  CO2 29 33* 37* 37*  GLUCOSE 183* 295* 199* 127*  BUN 53* 58* 86* 88*  CREATININE 2.22* 2.06* 1.95* 1.95*  CALCIUM 8.6* 8.9 9.2 9.6  MG 1.7  --   --   --    Liver Function Tests: No results for input(s): AST, ALT, ALKPHOS, BILITOT, PROT, ALBUMIN in the last 168 hours. No results for input(s): LIPASE, AMYLASE in the last 168 hours. No results for input(s): AMMONIA in the last 168 hours. CBC: Recent Labs  Lab 05/14/20 0550 05/15/20 0411 05/16/20 0433 05/17/20 0726  WBC 8.8 5.7  --  7.2  NEUTROABS 6.7  --   --  5.0  HGB 12.1* 12.0*  --  13.6  HCT 39.0 39.8  --  43.5  MCV 75.1* 76.0*  --  74.1*  PLT 128* 125* 139* 189   Cardiac Enzymes: Recent Labs  Lab 05/14/20 0550  CKTOTAL 55   BNP: Invalid input(s): POCBNP CBG: Recent Labs  Lab 05/16/20 1617 05/16/20 2018 05/17/20 0818 05/17/20 1227 05/17/20 1406  GLUCAP 419* 484* 134* 247* 333*   D-Dimer No results for input(s): DDIMER in the last 72 hours. Hgb A1c Recent Labs    05/14/20 1711  HGBA1C 8.7*   Lipid Profile No results for input(s): CHOL, HDL, LDLCALC, TRIG, CHOLHDL, LDLDIRECT in the last 72 hours. Thyroid function studies No results for input(s): TSH, T4TOTAL, T3FREE, THYROIDAB in the last 72 hours.  Invalid input(s): FREET3 Anemia work up No results for input(s): VITAMINB12, FOLATE, FERRITIN, TIBC, IRON, RETICCTPCT in the last 72 hours. Urinalysis     Component Value Date/Time   COLORURINE YELLOW (A) 08/04/2019 0814   APPEARANCEUR CLEAR (A) 08/04/2019 0814   APPEARANCEUR Clear 09/23/2014 0855   LABSPEC 1.010 08/04/2019 0814   PHURINE 5.0 08/04/2019 0814   GLUCOSEU 150 (A) 08/04/2019 0814   HGBUR NEGATIVE 08/04/2019 0814   BILIRUBINUR NEGATIVE 08/04/2019 0814   BILIRUBINUR Negative 09/23/2014 0855   KETONESUR NEGATIVE 08/04/2019 0814   PROTEINUR NEGATIVE 08/04/2019 0814   NITRITE NEGATIVE 08/04/2019 0814   LEUKOCYTESUR NEGATIVE 08/04/2019 0814   Sepsis Labs Invalid input(s): PROCALCITONIN,  WBC,  LACTICIDVEN Microbiology Recent Results (from the past 240 hour(s))  Blood culture (routine x 2)     Status: None (Preliminary result)   Collection Time: 05/14/20  7:50 AM   Specimen: BLOOD  Result Value Ref Range Status   Specimen Description BLOOD RIGHT ARM  Final   Special Requests   Final    BOTTLES DRAWN AEROBIC AND ANAEROBIC Blood Culture results may not be optimal due to an inadequate volume of blood received in culture bottles   Culture   Final    NO GROWTH 3 DAYS Performed at [facility], [address], [place], [place] [address]    Report Status PENDING  Incomplete  Blood culture (routine x 2)     Status: None (Preliminary result)   Collection Time: 05/14/20  7:51 AM   Specimen: BLOOD  Result Value Ref Range Status   Specimen Description BLOOD BLOOD RIGHT FOREARM  Final   Special Requests   Final    BOTTLES DRAWN AEROBIC AND ANAEROBIC Blood Culture adequate volume   Culture   Final    NO GROWTH 3 DAYS Performed at [facility], [place], [place] [address]    Report Status PENDING  Incomplete  SARS CORONAVIRUS 2 (TAT 6-24 HRS) Nasopharyngeal Nasopharyngeal Swab     Status: None   Collection Time: 05/14/20 11:36 AM   Specimen: Nasopharyngeal Swab  Result Value Ref Range Status   SARS Coronavirus 2 NEGATIVE NEGATIVE Final    Comment: (NOTE) SARS-CoV-2 target nucleic acids are  NOT DETECTED.  The SARS-CoV-2 RNA is generally detectable in upper and lower respiratory specimens during the acute phase of infection. Negative results do not preclude SARS-CoV-2 infection, do not rule out co-infections with other pathogens, and should not be used as the sole basis for treatment or other patient management decisions. Negative results must be combined with clinical observations, patient history, and epidemiological information. The expected result is Negative.  Fact Sheet for Patients: SugarRoll.be  Fact Sheet for Healthcare Providers: https://www.woods-mathews.com/  This test is not yet approved or cleared by the [place] FDA and  has been authorized for detection and/or diagnosis of SARS-CoV-2 by FDA under an Emergency Use Authorization (EUA). This EUA will remain  in effect (meaning this test can be used) for the duration of the COVID-19 declaration under Se ction 564(b)(1) of the Act, 21 U.S.C. section 360bbb-3(b)(1), unless the authorization is terminated or revoked sooner.  Performed at [facility], [place] [address] [place], [place] [address]      Time coordinating discharge: Over 30 minutes  SIGNED:   Sidney Ace, MD  [facility] 05/17/2020, 2:24 PM Pager   If 7PM-7AM, please contact night-coverage

## 2020-05-17 NOTE — Progress Notes (Signed)
[place], [place] 05/17/20  Subjective:   Hospital day # 3  Patient known to our practice from previous admission This time presents for acute exacerbation of joint pain in the right elbow and right wrist.  Diagnosed with acute gout vs cellulitis and admitted for further evaluation.  Nephrology consult requested to evaluate transplant regimen and follow along.  Patient seen today sitting in chair States improved ROM in elbow and wrist Able to tolerate meals Denies nausea Able to ambulate around unit Denies shortness of breath He voicing concerns of overdiuresis.   UOP - 1.7L  Objective:  Vital signs in last 24 hours:  Temp:  [97.5 F (36.4 C)-98.2 F (36.8 C)] 97.8 F (36.6 C) (02/23 1100) Pulse Rate:  [50-75] 61 (02/23 1100) Resp:  [16-18] 18 (02/23 1100) BP: (135-164)/(60-98) 146/88 (02/23 1100) SpO2:  [92 %-100 %] 95 % (02/23 1100)  Weight change:  Filed Weights   05/14/20 0515  Weight: 117.9 kg    Intake/Output:    Intake/Output Summary (Last 24 hours) at 05/17/2020 1153 Last data filed at 05/17/2020 1018 Gross per 24 hour  Intake 1080 ml  Output 1700 ml  Net -620 ml    Physical Exam: General:  No acute distress, sitting up in chair  HEENT  anicteric, moist oral mucous membrane  Pulm/lungs  normal breathing effort, lungs are clear  CVS/Heart  regular rhythm, no rub or gallop  Abdomen:   Soft, nontender  Extremities:  no peripheral edema  Neurologic:  Alert, oriented, able to follow commands  Skin:  No acute rashes  Right elbow and right wrist swelling and edema    Basic Metabolic Panel:  Recent Labs  Lab 05/14/20 0550 05/15/20 0411 05/17/20 0412 05/17/20 0726  NA 135 136 138 139  K 3.1* 3.5 2.6* 3.1*  CL 96* 93* 89* 89*  CO2 29 33* 37* 37*  GLUCOSE 183* 295* 199* 127*  BUN 53* 58* 86* 88*  CREATININE 2.22* 2.06* 1.95* 1.95*  CALCIUM 8.6* 8.9 9.2 9.6  MG 1.7  --   --   --      CBC: Recent Labs  Lab  05/14/20 0550 05/15/20 0411 05/16/20 0433 05/17/20 0726  WBC 8.8 5.7  --  7.2  NEUTROABS 6.7  --   --  5.0  HGB 12.1* 12.0*  --  13.6  HCT 39.0 39.8  --  43.5  MCV 75.1* 76.0*  --  74.1*  PLT 128* 125* 139* 189     No results found for: HEPBSAG, HEPBSAB, HEPBIGM    Microbiology:  Recent Results (from the past 240 hour(s))  Blood culture (routine x 2)     Status: None (Preliminary result)   Collection Time: 05/14/20  7:50 AM   Specimen: BLOOD  Result Value Ref Range Status   Specimen Description BLOOD RIGHT ARM  Final   Special Requests   Final    BOTTLES DRAWN AEROBIC AND ANAEROBIC Blood Culture results may not be optimal due to an inadequate volume of blood received in culture bottles   Culture   Final    NO GROWTH 3 DAYS Performed at [facility], [address] [place], [place] [address]    Report Status PENDING  Incomplete  Blood culture (routine x 2)     Status: None (Preliminary result)   Collection Time: 05/14/20  7:51 AM   Specimen: BLOOD  Result Value Ref Range Status   Specimen Description BLOOD BLOOD RIGHT FOREARM  Final  Special Requests   Final    BOTTLES DRAWN AEROBIC AND ANAEROBIC Blood Culture adequate volume   Culture   Final    NO GROWTH 3 DAYS Performed at [facility], [place], [place] [address]    Report Status PENDING  Incomplete  SARS CORONAVIRUS 2 (TAT 6-24 HRS) Nasopharyngeal Nasopharyngeal Swab     Status: None   Collection Time: 05/14/20 11:36 AM   Specimen: Nasopharyngeal Swab  Result Value Ref Range Status   SARS Coronavirus 2 NEGATIVE NEGATIVE Final    Comment: (NOTE) SARS-CoV-2 target nucleic acids are NOT DETECTED.  The SARS-CoV-2 RNA is generally detectable in upper and lower respiratory specimens during the acute phase of infection. Negative results do not preclude SARS-CoV-2 infection, do not rule out co-infections with other pathogens, and should not be used as the sole basis for  treatment or other patient management decisions. Negative results must be combined with clinical observations, patient history, and epidemiological information. The expected result is Negative.  Fact Sheet for Patients: SugarRoll.be  Fact Sheet for Healthcare Providers: https://www.woods-mathews.com/  This test is not yet approved or cleared by the [place] FDA and  has been authorized for detection and/or diagnosis of SARS-CoV-2 by FDA under an Emergency Use Authorization (EUA). This EUA will remain  in effect (meaning this test can be used) for the duration of the COVID-19 declaration under Se ction 564(b)(1) of the Act, 21 U.S.C. section 360bbb-3(b)(1), unless the authorization is terminated or revoked sooner.  Performed at [facility], [place] [address], [place], [place] [address]     Coagulation Studies: No results for input(s): LABPROT, INR in the last 72 hours.  Urinalysis: No results for input(s): COLORURINE, LABSPEC, PHURINE, GLUCOSEU, HGBUR, BILIRUBINUR, KETONESUR, PROTEINUR, UROBILINOGEN, NITRITE, LEUKOCYTESUR in the last 72 hours.  Invalid input(s): APPERANCEUR    Imaging: No results found.   Medications:   . sodium chloride 250 mL (05/15/20 0435)  .  ceFAZolin (ANCEF) IV 2 g (05/17/20 1018)   . apixaban  5 mg Oral BID  . vitamin C  250 mg Oral Daily  . buPROPion  150 mg Oral BID  . colchicine  0.3 mg Oral QODAY  . febuxostat  40 mg Oral Daily  . fluticasone  2 spray Each Nare Daily  . gabapentin  300 mg Oral BID  . insulin aspart  0-15 Units Subcutaneous TID WC  . insulin aspart  0-5 Units Subcutaneous QHS  . insulin aspart  10 Units Subcutaneous TID WC  . insulin glargine  45 Units Subcutaneous QHS  . iron polysaccharides  150 mg Oral Daily  . lidocaine  1 patch Transdermal Q24H  . magnesium oxide  400 mg Oral Daily  . metolazone  5 mg Oral Once per day on Mon Thu  . montelukast  10 mg Oral QHS  .  multivitamin with minerals  1 tablet Oral Daily  . mycophenolate  360 mg Oral BID  . pantoprazole  40 mg Oral Daily  . potassium chloride  40 mEq Oral BID  . predniSONE  40 mg Oral Q breakfast  . rosuvastatin  10 mg Oral Daily  . sodium chloride flush  3 mL Intravenous Q12H  . tacrolimus  1.5 mg Oral QHS  . tacrolimus  2 mg Oral q AM  . tamsulosin  0.4 mg Oral Daily  . [START ON 05/18/2020] torsemide  40 mg Oral Daily  . zinc sulfate  220 mg Oral BID   sodium chloride,  acetaminophen **OR** acetaminophen, HYDROcodone-acetaminophen, ipratropium-albuterol, ondansetron **OR** ondansetron (ZOFRAN) IV, sodium chloride flush  Assessment/ Plan:  60 y.o. male with  Diabetes, hypertension, Coronary disease, CABG, deceased donor renal transplant 09/13/2010, Gastric sleeve surgery 03/14/2014  admitted on 05/14/2020 for Cellulitis [L03.90] Elbow pain [M25.529] Wrist pain [M25.539] Cellulitis of right upper extremity [L03.113]  Renal transplant status CKD stage IIIB with diabetic nephropathy-biopsy 2017  immunosuppression induction with Campath, now maintenance with tacrolimus and mycophenolate. 1 month allograft biopsy July 2012- recovering ATN with early tubular atrophy, interstitial fibrosis. Repeat biopsy March 2017 for increasing proteinuria- no acute rejection, showed diabetic nephropathy, FSGS  Followed by transplant center [facility] in [place].  Last seen on February 15. - Continue home dose of immunosuppression Currently mycophenolate (Myfortic) 360 mg twice a day Latest transplant note from February 18 states Prograf dose 2 mg in a.m. and 1.5 mg in the afternoon.  We will adjust the orders.   Pulmonary hypertension, lower extremity edema Decreased diuretic to Torsemide 58m daily Minimal BLE edema Follow strict low-sodium diet Patient is able to verbalize home medication regimen   DM-2 with CKD Lab Results  Component Value Date   HGBA1C 8.7 (H) 05/14/2020   Encouraged  stricter diet compliance    LOS: 3 SColon Flattery2/23/202211:53 AM  [facility] Note: This note was prepared with Dragon dictation. Any transcription errors are unintentional

## 2020-05-17 NOTE — Progress Notes (Signed)
IVs removed before discharge. Went over discharge instructions with patient. All questions were answered, patient stated that he understood. Patient going home with wife POV.

## 2020-05-17 NOTE — TOC Transition Note (Signed)
Transition of Care [facility]) - CM/SW Discharge Note   Patient Details  Name: [name]. MRN: [id number] Date of Birth: 1960/11/06  Transition of Care [facility]) CM/SW Contact:  Candie Chroman, LCSW Phone Number: 05/17/2020, 2:23 PM   Clinical Narrative: Patient has orders to discharge home today. No further concerns. CSW signing off.    Final next level of care: Home/Self Care Barriers to Discharge: Barriers Resolved   Patient Goals and CMS Choice Patient states their goals for this hospitalization and ongoing recovery are:: to return home with spouse CMS Medicare.gov Compare Post Acute Care list provided to:: Patient Choice offered to / list presented to : Patient  Discharge Placement                    Patient and family notified of of transfer: 05/17/20  Discharge Plan and Services                                     Social Determinants of Health (SDOH) Interventions     Readmission Risk Interventions Readmission Risk Prevention Plan 05/15/2020 07/15/2019 07/02/2019  Transportation Screening Complete Complete Complete  PCP or Specialist Appt within 3-5 Days Complete - -  HRI or Home Care Consult Complete - -  Social Work Consult for [place] Planning/Counseling Complete - -  Palliative Care Screening Not Applicable - -  Medication Review Press photographer) Complete Complete Complete  PCP or Specialist appointment within 3-5 days of discharge - Complete Complete  SW Recovery Care/Counseling Consult - Complete -  Palliative Care Screening - Not Applicable Not [place] - Not Applicable Not Applicable  Some recent data might be hidden

## 2020-05-18 ENCOUNTER — Telehealth (HOSPITAL_COMMUNITY): Payer: Self-pay

## 2020-05-18 NOTE — Telephone Encounter (Signed)
Attempted to contact to check on him and make sure he understand discharge notes.  Left message. .  Kristi [place] [phone]

## 2020-05-19 ENCOUNTER — Other Ambulatory Visit: Payer: Self-pay | Admitting: *Deleted

## 2020-05-19 ENCOUNTER — Telehealth: Payer: Self-pay

## 2020-05-19 LAB — CULTURE, BLOOD (ROUTINE X 2)
Culture: NO GROWTH
Culture: NO GROWTH
Special Requests: ADEQUATE

## 2020-05-19 NOTE — Patient Outreach (Signed)
[facility]) Care Management  05/19/2020  [name] 11/16/1960 [id number]   Transition of care telephone call  Referral received:05/15/20 Initial outreach:05/19/20 Insurance: [facility]  Initial unsuccessful telephone call to patient's preferred number in order to complete transition of care assessment; no answer, left HIPAA compliant voicemail message requesting return call.   Objective; Per electronic record [name]  was hospitalized at [facility] 2/20-2/23/22 for Acute Gour flare right elbow Comorbidities include: Chronic Diastolic heart failure, Diabetes type 2, A1c ( 8.7% on 05/14/20), ESRD, s/p kidney transplant  2012,, CABG, Atrial fib, chronic venous stasis, lower extremities.  He was discharged to home on 05/17/20  without the need for home health services or DME.      Plan: This RNCM will route unsuccessful outreach letter with [place] Management pamphlet and 24 hour Nurse Advice Line Magnet to [place] Management clinical pool to be mailed to patient's home address. This RNCM will attempt another outreach within 4 business days.  Joylene Draft, RN, BSN  [facility] Management Coordinator  [phone]- Mobile [phone]- Toll Free Main Office

## 2020-05-19 NOTE — Telephone Encounter (Signed)
Spoke to pt to see how he was doing after recent hospital stay. He said he is weak. Drinking plenty of fluids and blood sugar was 189 after eating some eggs. He said if he does not get better, he will go to the ER over the weekend.

## 2020-05-22 ENCOUNTER — Telehealth (HOSPITAL_COMMUNITY): Payer: Self-pay

## 2020-05-22 ENCOUNTER — Other Ambulatory Visit: Payer: Self-pay | Admitting: *Deleted

## 2020-05-22 ENCOUNTER — Encounter: Payer: Self-pay | Admitting: *Deleted

## 2020-05-22 DIAGNOSIS — D849 Immunodeficiency, unspecified: Secondary | ICD-10-CM | POA: Diagnosis not present

## 2020-05-22 DIAGNOSIS — R600 Localized edema: Secondary | ICD-10-CM | POA: Diagnosis not present

## 2020-05-22 DIAGNOSIS — Z5181 Encounter for therapeutic drug level monitoring: Secondary | ICD-10-CM | POA: Diagnosis not present

## 2020-05-22 DIAGNOSIS — E876 Hypokalemia: Secondary | ICD-10-CM | POA: Diagnosis not present

## 2020-05-22 DIAGNOSIS — I10 Essential (primary) hypertension: Secondary | ICD-10-CM | POA: Diagnosis not present

## 2020-05-22 DIAGNOSIS — Z79899 Other long term (current) drug therapy: Secondary | ICD-10-CM | POA: Diagnosis not present

## 2020-05-22 DIAGNOSIS — Z940 Kidney transplant status: Secondary | ICD-10-CM | POA: Diagnosis not present

## 2020-05-22 NOTE — Telephone Encounter (Signed)
Spoke with [name] today.  He states hanging in.  He states has some redness around the contusion on leg and very tender.  Hurts when ambulates and hurts when sheets touch it at night.  He is concerned of infection.  He states been placing ice on it but does not appear getting better, appears worse.  He states everything has been good. Weight was up from discharge and he went back on torsemide 100 mg twice daily and weight is back down to 259 lbs today.  He states urinating good.  He is taking metolazone twice a week.  He has all his medications.  He is monitoring his sugars closely due to the prednisone and takes extra insulin when need to.  He is watching his diet and fluids.  Made a home visit appt for this week.  Will visit for heart failure.  I also suggested him going to urgent care at [facility] to get leg checked, Dr Mack Guise seen him in the hospital.    [place] [phone]

## 2020-05-22 NOTE — Patient Outreach (Signed)
[facility]) Care Management  05/22/2020  Thorsten Masyn Rostro 07/14/1960 [id number]  Transition of care call/case closure   Referral received:05/15/20 Initial outreach:05/19/20 Insurance: [employer] call from [place] EMT that had telephone visit with patient on today. She states patient will not be available for my  scheduled telephone visit on 05/24/20, suggested today may be good time to call.    Subjective: 2nd attempt  successful telephone call to patient's preferred number in order to complete transition of care assessment; 2 HIPAA identifiers verified. Explained purpose of call and completed transition of care assessment.  States that he is still having discomfort in the elbow, hurts with extending no worse than when he left the hospital , he reports having follow up appointment with rheumatology set up.He also discussed having pain at area of hematoma to right  leg after his dog ran into him a week ago.He reports increased discomfort when walking.  He voiced being concern due to area being a little pink now, discussed history of previous problems after having similar area and being on eliquis. He discussed being advised earlier by EMT Kristi to be evaluated at [facility] and he plans to on going this afternoon. He reports attending office visit with Nephrology regarding Kidney transplant for labs on today.  Patient discussed having concern regarding weight gain after discharge and he has resumed his daily prior to admission dose of Torsemide of 100 mg twice daily with weight down to 259, He denies increase of shortness of breath. He reports improved management of monitoring blood sugar with freestyle Libre , reading today 129 am, and 289 at lunch, he reports near completion of prednisone which increases blood sugar. He reports taking insulins as prescribed.  He is tolerating diet, watching intake and fluids, he  denies bowel or bladder problems.   Spouse/children are assisting with his recovery.   Reviewed accessing the following [place] Benefits : He discussed  any ongoing health issues and is enrolled in one of the [place] chronic disease management program  He uses a [facility] outpatient pharmacy at [employer].       Objective:  Mr. Kahlin Mark was hospitalized at [facility] 2/20-2/23/22 for Acute Gour flare right elbow Comorbidities include: Chronic Diastolic heart failure, Diabetes type 2, A1c ( 8.7% on 05/14/20), ESRD, s/p kidney transplant  2012,, CABG, Atrial fib, chronic venous stasis, lower extremities.  He was discharged to home on 05/17/20  without the need for home health servicesor DME. Assessment:  Patient voices good understanding of all discharge instructions.  See transition of care flowsheet for assessment details.   Plan:  Reviewed hospital discharge diagnosis of Acute Gout Flare    and discharge treatment plan using hospital discharge instructions, assessing medication adherence, reviewing problems requiring provider notification, and discussing the importance of follow up with surgeon, primary care provider and/or specialists as directed. Reinforced scheduling post discharge recommended follow up appointments with PCP and cardiology.   Reviewed [place] healthy lifestyle program information to receive discounted premium for  2023   Step 1: Get  your annual physical  Step 2: Complete your health assessment  Step 3:Identify your current health status and complete the corresponding action step between March 25, 2020 and November 23, 2020.    Using [employer] website, verified that patient is an active participate in [place]'s Active Health Management chronic disease management program.    Patient agreeable to  follow up call in the next 4 business days regarding, coordination of scheduling follow up appointments.   Joylene Draft,  RN, BSN  [place] Management Coordinator  [phone]- Mobile [phone]- Toll Free Main Office

## 2020-05-23 ENCOUNTER — Other Ambulatory Visit: Payer: Self-pay | Admitting: Internal Medicine

## 2020-05-23 ENCOUNTER — Other Ambulatory Visit: Payer: Self-pay | Admitting: *Deleted

## 2020-05-23 ENCOUNTER — Telehealth: Payer: Self-pay | Admitting: Internal Medicine

## 2020-05-23 NOTE — Patient Outreach (Signed)
[facility]) Care Management  05/23/2020  [name] 08/20/1960 [id number]   EMMI Red Flag- General   Day :# Date: 05/22/20 Reason : Other questions and problems ? Yes    Subjective  Successful outreach call to patient explained reason for the call , EMMI automated discharge calls.  Addressed EMMI red alert flag of Other questions  and problems ? Yes, He states that he had questions yesterday  regarding hematoma area on left leg he states that his questions had been answered on yesterday. States he was advised regarding following up at [facility], states he placed ice on it yesterday and it started to feel better and decided not to go. No increase in size of area . Discussed scheduling follow up with PCP states that he is working on that now. He denies any other concerns at this time .  Plan Patient is agreeable to follow up call in the next week as previous agreement for transition of care   Joylene Draft, RN, BSN  [place] Management Coordinator  [phone]- Mobile [phone]- [place]

## 2020-05-23 NOTE — Telephone Encounter (Signed)
Pt called in wanted to know about the medication that was denied ,

## 2020-05-23 NOTE — Telephone Encounter (Signed)
Pt states he has been taking bupropion 157m bid daily. It was noted that it was removed from his med list 08-16-19. He does not remember anyone telling him to stop the medication. Asking if he should be off of it or can he have a refill?

## 2020-05-24 ENCOUNTER — Other Ambulatory Visit: Payer: Self-pay | Admitting: Internal Medicine

## 2020-05-24 ENCOUNTER — Telehealth (HOSPITAL_COMMUNITY): Payer: Self-pay

## 2020-05-24 ENCOUNTER — Ambulatory Visit: Payer: Self-pay | Admitting: *Deleted

## 2020-05-24 MED ORDER — BUPROPION HCL ER (SR) 150 MG PO TB12: 150.0000 mg | ORAL_TABLET | Freq: Two times a day (BID) | ORAL | 3 refills | Status: DC

## 2020-05-24 NOTE — Telephone Encounter (Signed)
Sent in new rx. Sent pt a Therapist, music.

## 2020-05-24 NOTE — Telephone Encounter (Signed)
Damarien contacted me this morning wanting reschedule home visit for today.  Rescheduled it for next Monday at 11:00 am.   He states was feeling better annd he was doing better.   Will visit for heart failure.   [place] [phone]

## 2020-05-24 NOTE — Telephone Encounter (Signed)
I don't remember stopping it and it makes sense to continue Okay to refill for 1 year

## 2020-05-24 NOTE — Addendum Note (Signed)
Addended by: Pilar Grammes on: 05/24/2020 11:20 AM   Modules accepted: Orders

## 2020-05-25 ENCOUNTER — Other Ambulatory Visit: Payer: Self-pay

## 2020-05-25 ENCOUNTER — Encounter: Payer: Self-pay | Admitting: Internal Medicine

## 2020-05-25 ENCOUNTER — Ambulatory Visit (INDEPENDENT_AMBULATORY_CARE_PROVIDER_SITE_OTHER): Payer: 59 | Admitting: Internal Medicine

## 2020-05-25 ENCOUNTER — Other Ambulatory Visit: Payer: Self-pay | Admitting: Internal Medicine

## 2020-05-25 DIAGNOSIS — M109 Gout, unspecified: Secondary | ICD-10-CM | POA: Insufficient documentation

## 2020-05-25 DIAGNOSIS — I8001 Phlebitis and thrombophlebitis of superficial vessels of right lower extremity: Secondary | ICD-10-CM | POA: Diagnosis not present

## 2020-05-25 DIAGNOSIS — I2510 Atherosclerotic heart disease of native coronary artery without angina pectoris: Secondary | ICD-10-CM

## 2020-05-25 DIAGNOSIS — G479 Sleep disorder, unspecified: Secondary | ICD-10-CM | POA: Diagnosis not present

## 2020-05-25 DIAGNOSIS — M10021 Idiopathic gout, right elbow: Secondary | ICD-10-CM | POA: Diagnosis not present

## 2020-05-25 DIAGNOSIS — E662 Morbid (severe) obesity with alveolar hypoventilation: Secondary | ICD-10-CM | POA: Diagnosis not present

## 2020-05-25 DIAGNOSIS — G4733 Obstructive sleep apnea (adult) (pediatric): Secondary | ICD-10-CM | POA: Diagnosis not present

## 2020-05-25 HISTORY — DX: Gout, unspecified: M10.9

## 2020-05-25 MED ORDER — TEMAZEPAM 15 MG PO CAPS: 15.0000 mg | ORAL_CAPSULE | Freq: Every evening | ORAL | 0 refills | Status: DC | PRN

## 2020-05-25 MED ORDER — LIDOCAINE 5 % EX PTCH: 1.0000 | MEDICATED_PATCH | Freq: Two times a day (BID) | CUTANEOUS | 11 refills | Status: DC

## 2020-05-25 MED ORDER — PREDNISONE 20 MG PO TABS: 20.0000 mg | ORAL_TABLET | Freq: Every day | ORAL | 0 refills | Status: DC

## 2020-05-25 NOTE — Progress Notes (Signed)
Subjective:    Patient ID: [name]., male    DOB: [birth year], 60 y.o.   MRN: [id number]  HPI Here for hospital follow up---severe gout in right arm This visit occurred during the SARS-CoV-2 public health emergency.  Safety protocols were in place, including screening questions prior to the visit, additional usage of staff PPE, and extensive cleaning of exam room while observing appropriate contact time as indicated for disinfecting solutions.   Had pain, swelling, heat and redness in right elbow and wrist Doesn't remember redness in between--but not sure  Had been splitting wood That night--sudden pain and had to go to hospital No fever Main differential was gout vs infection Got broad spectrum antibiotics and morphine/dilaudid for severe pain Is on uloric in general--didn't have a chance to try his colchicine Finally started improving after IV steroids Hospital records reviewed  Home a week ago Done with the prednisone that he got Still having pain and significant decreased extension of right elbow  Current Outpatient Medications on File Prior to Visit  Medication Sig Dispense Refill  . beclomethasone (QVAR) 80 MCG/ACT inhaler Inhale 2 puffs into the lungs 2 (two) times daily.     Marland Kitchen buPROPion (WELLBUTRIN SR) 150 MG 12 hr tablet Take 1 tablet (150 mg total) by mouth 2 (two) times daily. 180 tablet 3  . colchicine 0.6 MG tablet Take 0.6 mg by mouth daily as needed.    . dapagliflozin propanediol (FARXIGA) 10 MG TABS tablet Take 1 tablet (10 mg total) by mouth daily before breakfast. 30 tablet 6  . diclofenac Sodium (VOLTAREN) 1 % GEL Apply 2 g topically 3 (three) times daily as needed. 100 g 0  . ELIQUIS 5 MG TABS tablet TAKE 1 TABLET BY MOUTH TWICE DAILY 180 tablet 1  . febuxostat (ULORIC) 40 MG tablet Take 40 mg by mouth daily.    Marland Kitchen FERREX 150 150 MG capsule Take 150 mg by mouth daily.     . fluticasone (FLONASE) 50 MCG/ACT nasal spray PLACE 2 SPRAYS INTO BOTH  NOSTRILS DAILY. 16 g 11  . gabapentin (NEURONTIN) 300 MG capsule Take 300 mg by mouth 2 (two) times daily.    Marland Kitchen HUMALOG KWIKPEN 100 UNIT/ML KwikPen Inject 5-10 Units into the skin 3 (three) times daily. Sliding scale    . insulin glargine (LANTUS) 100 unit/mL SOPN Inject 40 Units into the skin at bedtime.     . lidocaine (LIDODERM) 5 % Place 1 patch onto the skin every 12 (twelve) hours. Remove & Discard patch within 12 hours or as directed by MD 10 patch 0  . metolazone (ZAROXOLYN) 5 MG tablet Take 1 tablet (5 mg total) by mouth 2 (two) times a week. On tuesdays Sundays    . mometasone (NASONEX) 50 MCG/ACT nasal spray Place 2 sprays into the nose daily as needed.    . montelukast (SINGULAIR) 10 MG tablet TAKE 1 TABLET BY MOUTH AT BEDTIME. 90 tablet 3  . Multiple Vitamin (MULTIVITAMIN WITH MINERALS) TABS tablet Take 1 tablet by mouth daily.    . mycophenolate (MYFORTIC) 180 MG EC tablet Take 360 mg by mouth 2 (two) times daily.    Marland Kitchen omeprazole (PRILOSEC) 20 MG capsule Take 1 capsule (20 mg total) by mouth daily. 90 capsule 3  . potassium chloride (MICRO-K) 10 MEQ CR capsule Take 6 capsules (60 mEq total) by mouth 2 (two) times daily. Take an extra tab on Tue and Fri with metolazone 360 capsule 3  . rosuvastatin (CRESTOR)  10 MG tablet TAKE 1 TABLET BY MOUTH DAILY 90 tablet 2  . tacrolimus (PROGRAF) 1 MG capsule 3 mg in AM and 32m in PM    . tadalafil, PAH, (ADCIRCA) 20 MG tablet Take 1 tablet (20 mg total) by mouth every other day. 60 tablet 1  . tamsulosin (FLOMAX) 0.4 MG CAPS capsule Take 0.4 mg by mouth daily.     .Marland Kitchentorsemide 40 MG TABS Take 40 mg by mouth daily for 7 days. Take 468mdaily x 5 days.  Can resume previous regimen after that date or sooner if you are building up fluid (Patient taking differently: Take 100 mg by mouth 2 (two) times daily. Take 4051maily x 5 days.  Can resume previous regimen after that date or sooner if you are building up fluid  He is taking his normal regimen of  100 mg twice daily) 5 tablet 0  . vitamin C (ASCORBIC ACID) 250 MG tablet Take 1 tablet (250 mg total) by mouth daily. 30 tablet 0  . zinc sulfate 220 (50 Zn) MG capsule Take 1 capsule (220 mg total) by mouth 2 (two) times daily. 60 capsule 0   No current facility-administered medications on file prior to visit.    Allergies  Allergen Reactions  . Contrast Media [Iodinated Diagnostic Agents]     Kidney transplant  . Ibuprofen Other (See Comments)    Due to kidney transplant Due to kidney transplant Due to kidney transplant    Past Medical History:  Diagnosis Date  . Allergy    seasonal  . Amnestic MCI (mild cognitive impairment with memory loss)   . Asthma   . Benign neoplasm of colon   . CAD (coronary artery disease) 2005   a.  Status post four-vessel CABG in 2005; b. 03/2018 MV: Small, fixed inferoapical and apical septal defect. No ischemia. EF 47% (60-65% by 05/2018 Echo).  . Chronic atrial fibrillation ([facility]  a. CHADS2VASc 4 (CHF, HTN, DM, vascular disease)--> Eliquis; b. 09/2018 Zio: Chronic Afib.  . Chronic combined systolic and diastolic CHF (congestive heart failure) ([facility]  a.  7/18 Echo: EF 45-50%; b. 05/2018 Echo: EF 60-65%, RVSP 74.8; c. 12/2018 Echo: EF 50-55%. Sev dil LA.  . CMarland Kitchenronic venous stasis dermatitis of both lower extremities   . Cytomegaloviral disease (HCC[place]017  . Depression   . Diabetes mellitus   . Diverticulosis   . Dyspnea   . Erectile dysfunction   . ESRD (end stage renal disease) ([facility]CC[place]005   a. HD 2004-2012; b. S/p renal transplant in 2012  . GERD (gastroesophageal reflux disease)   . Gout   . Hyperlipidemia    low since renal failure  . Hypertension   . Kidney transplant status, cadaveric 2012  . Obesity   . PAH (pulmonary artery hypertension) ([facility]  a. 05/2018 Echo: RVSP 74.8mm74m b. 12/2018 RV not well visualized.  . Pneumonia   . [place] (HCC)[place]. Sleep apnea    bipap with oxygen 2 l  . Trigger finger   . Ulcer 05/2016   Left  shin  . Wears glasses     Past Surgical History:  Procedure Laterality Date  . BARIATRIC SURGERY    . CARDIAC CATHETERIZATION     [facility]  . CARDIAC CATHETERIZATION  05/03/2019  . CATARACT EXTRACTION W/PHACO Right 10/29/2017   Procedure: CATARACT EXTRACTION PHACO AND INTRAOCULAR LENS PLACEMENT ([place]OC)[place];  Surgeon: BrasLeandrew Koyanagi;  [facility];  Service: Ophthalmology;  Laterality: Right;  Diabetic - insulin  . CATARACT EXTRACTION W/PHACO Left 11/18/2017   Procedure: CATARACT EXTRACTION PHACO AND INTRAOCULAR LENS PLACEMENT ([place]) LEFT IVA/TOPICAL;  Surgeon: [name], MD;  Location: [place];  Service: Ophthalmology;  Laterality: Left;  DIABETES - insulin sleep apnea  . COLONOSCOPY WITH PROPOFOL N/A 04/22/2013   Procedure: COLONOSCOPY WITH PROPOFOL;  Surgeon: [name], MD;  Location: [facility];  Service: Endoscopy;  Laterality: N/A;  . CORONARY ARTERY BYPASS GRAFT  2005   X 4  . DG ANGIO AV SHUNT*L*     right and left upper arms  . FASCIOTOMY  03/03/2012   Procedure: FASCIOTOMY;  Surgeon: [name], MD;  Location: [place];  Service: Orthopedics;  Laterality: Right;  FASCIOTOMY RIGHT SMALL FINGER  . FASCIOTOMY Left 08/17/2013   Procedure: FASCIOTOMY LEFT RING;  Surgeon: [name], MD;  Location: [place];  Service: Orthopedics;  Laterality: Left;  . INCISION AND DRAINAGE ABSCESS Left 10/15/2015   Procedure: INCISION AND DRAINAGE ABSCESS;  Surgeon: [name], MD;  Location: [facility] ORS;  Service: General;  Laterality: Left;  . KIDNEY TRANSPLANT  09/13/2010   cadaver--at [facility]  . RIGHT HEART CATH N/A 11/15/2016   Procedure: RIGHT HEART CATH;  Surgeon: [name], MD;  Location: [place] CV LAB;  Service: Cardiovascular;  Laterality: N/A;  . RIGHT HEART CATH N/A 05/03/2019   Procedure: RIGHT HEART CATH;  Surgeon: [name], MD;  Location: [place] CV LAB;  Service:  Cardiovascular;  Laterality: N/A;  . TYMPANIC MEMBRANE REPAIR  1/12   left  . VASECTOMY      Family History  Problem Relation Age of Onset  . Heart disease Father   . Kidney failure Father   . Kidney disease Father   . Diabetes Maternal Grandmother   . Breast cancer Maternal Grandmother   . Valvular heart disease Mother   . Liver cancer Paternal Uncle   . Liver cancer Paternal Grandmother   . Prostate cancer Neg Hx     Social History   Socioeconomic History  . Marital status: Married    Spouse name: Not on file  . Number of children: 2  . Years of education: Not on file  . Highest education level: Not on file  Occupational History  . Occupation: Warden/ranger: unemployed    Comment: disabled due to kidney failure  Tobacco Use  . Smoking status: Former Smoker    Types: Cigars    Quit date: 09/02/1994    Years since quitting: 25.7  . Smokeless tobacco: Never Used  Vaping Use  . Vaping Use: Never used  Substance and Sexual Activity  . Alcohol use: Yes    Alcohol/week: 0.0 - 1.0 standard drinks    Comment: occasional- 3 in the past year  . Drug use: No  . Sexual activity: Not on file  Other Topics Concern  . Not on file  Social History Narrative   In school for culinary arts      Has living will   Wife is health care POA   Would accept resuscitation attempts   Hasn't considered tube feedings   Social Determinants of Health   Financial Resource Strain: Not on file  Food Insecurity: Not on file  Transportation Needs: Not on file  Physical Activity: Not on file  Stress: Not on file  Social Connections: Not on file  Intimate Partner Violence: Not on file   Review of Systems No fever Appetite is off--but is eating    Objective:   Physical Exam Constitutional:      Appearance: Normal appearance.  Musculoskeletal:     Comments: Moderate swelling in right elbow and wrist with significant tenderness  Neurological:     Mental  Status: He is alert.            Assessment & Plan:

## 2020-05-25 NOTE — Assessment & Plan Note (Signed)
And wrist Didn't appear to ever have cellulitis Was better till the prednisone taper ran out recently---will restart 61m daily till he sees rheumatologist (Posey Pronto next week Remains on uloric Can try colchicine if gets worse

## 2020-05-25 NOTE — Assessment & Plan Note (Signed)
Having trouble again since hospital and steroids Will give a few temazepam for brief use

## 2020-05-25 NOTE — Assessment & Plan Note (Signed)
Has swollen red area that is tender on left calf where dog rammed it with his head Doesn't look infected----and had broad spectrum antibiotics He can try warm compresses

## 2020-05-26 ENCOUNTER — Ambulatory Visit: Payer: Self-pay | Admitting: *Deleted

## 2020-05-28 DIAGNOSIS — E1165 Type 2 diabetes mellitus with hyperglycemia: Secondary | ICD-10-CM | POA: Diagnosis not present

## 2020-05-29 ENCOUNTER — Other Ambulatory Visit (HOSPITAL_COMMUNITY): Payer: Self-pay

## 2020-05-30 ENCOUNTER — Other Ambulatory Visit: Payer: Self-pay | Admitting: *Deleted

## 2020-05-30 NOTE — Progress Notes (Signed)
Had a home visit scheduled today, he states not at home had to run an errand.  Will have to reschedule.  He states getting his energy back, feeling lots better.  He states weight is 261 lbs.  He states leg and arm is healing and no where near the pain he was having.  He states has all his medications.  Will call to reschedule later.   [place] [phone]

## 2020-05-30 NOTE — Patient Outreach (Signed)
[facility]) Care Management  05/30/2020  [name] 1960-08-14 [id number]    Transition of care follow up call /Case Closure   Referral received:05/15/20 Initial outreach:05/19/20 Insurance: [employer] #3 Subjective Unsuccessful telephone follow up call , no answer unable to leave a message, recordings states voicemail not set up.   1015 Return call form patient states that he is doing much better, he reports gout pain/discomfort is now gone, after 2nd course of prednisone after seeing PCP.  He reports being able to return to the gym and do some yard work on yesterday.  He discussed his weight today being 258,denies sudden weight gain, swelling or heart failure symptoms . He also report improvement in swelling area on left leg stating that it has gone down.  [name] denies any new concerns at this time, he has appointment with Rheumatology on 3/9.  He has rescheduled home visit with Nile Dear, Paramedicine program on 3/14.    Plan Will plan case closure with [name] care management no ongoing care management needs identified. Patient is enrolled in telephonic  Chronic Disease Management program as benefit of [place] Active health management for continued education and support of chronic conditions.    Joylene Draft, RN, BSN  [place] Management Coordinator  [phone]- Mobile [phone]- Toll Free Main Office

## 2020-05-31 ENCOUNTER — Other Ambulatory Visit: Payer: Self-pay | Admitting: Rheumatology

## 2020-05-31 DIAGNOSIS — M109 Gout, unspecified: Secondary | ICD-10-CM | POA: Diagnosis not present

## 2020-05-31 DIAGNOSIS — M0579 Rheumatoid arthritis with rheumatoid factor of multiple sites without organ or systems involvement: Secondary | ICD-10-CM | POA: Diagnosis not present

## 2020-05-31 DIAGNOSIS — Z79899 Other long term (current) drug therapy: Secondary | ICD-10-CM | POA: Diagnosis not present

## 2020-06-02 ENCOUNTER — Telehealth: Payer: Self-pay

## 2020-06-02 NOTE — Telephone Encounter (Signed)
I would imagine that she needs formal FMLA paperwork

## 2020-06-02 NOTE — Telephone Encounter (Signed)
Pt is requesting a letter for pts wife,Valerie Tippin who is a Marine scientist at [employer] ED to excuse pts wife from work when she has to take pt to doctors appt. Pt said his wife takes him to appts for PCP, cardiology and nephrology so pt needs letter that she may be out of work twice a month or as needed to take pt to doctors appts. Pt request cb from [facility] CMA first of next wk. Sending note to Dr Silvio Pate and Larene Beach CMA.

## 2020-06-05 NOTE — Telephone Encounter (Signed)
Spoke to pt. He said his wife is going to court due to a speeding ticket she received. There is a possibility [employer] is going to take her drivers license for 30 days. So, they need a note stating she is his primary driver to appointments.

## 2020-06-05 NOTE — Telephone Encounter (Signed)
Okay to write note that it is medically necessary for his wife to be able to drive him to his many appointments

## 2020-06-06 NOTE — Telephone Encounter (Signed)
Letter written, signed, and placed up front for pickup. I have sent pt a MyChart message letting him know.

## 2020-06-12 ENCOUNTER — Telehealth: Payer: Self-pay | Admitting: Internal Medicine

## 2020-06-12 NOTE — Telephone Encounter (Signed)
Pt wife called and stated that its for intermittent leave for his wife.

## 2020-06-12 NOTE — Telephone Encounter (Signed)
Make sure you know exactly what dates and times he needs off

## 2020-06-12 NOTE — Telephone Encounter (Signed)
[name] stated that his FMLA from [employer] has been faxed to the office and he needed them filled out and faxed back in to be faxed back in ASAP.

## 2020-06-12 NOTE — Telephone Encounter (Signed)
Paperwork given to PCP per Irene Shipper, training for FMLA.

## 2020-06-12 NOTE — Telephone Encounter (Signed)
Okay Forms done for wife

## 2020-06-14 ENCOUNTER — Other Ambulatory Visit (HOSPITAL_COMMUNITY): Payer: Self-pay

## 2020-06-14 ENCOUNTER — Encounter (HOSPITAL_COMMUNITY): Payer: Self-pay

## 2020-06-14 NOTE — Progress Notes (Signed)
Today had a home visit with Adham.  He states been doing a lot better.  He is going and doing more.  He has resumed going back to the gym.  He did advise his weight is up but coming down.  He fill full in abdomen.  [place] and forgot to take his fluid pills with him.  He has been watching his diet and fluids closely and taking his meds since back home.  He has taken metolazone today as ordered and he states urinating a lot today.  He gained 7 lbs this weekend and he has lot 4 of the 7.  Advised him to let me know his weight in the morning as soon as he weighs and if still full feeling and weight is still up I will contact HF clinic.  He denies increased shortness of breath.  No edema in lower legs.  Belly is tight.  He states loss of appetite due to fullness feeling.  He is taking it easy today.  Denies any chest pain, headaches or dizziness.  Lungs are clear. He has all his medications and verified them.  Will continue to visit for heart failure.   [place] [phone]

## 2020-06-15 ENCOUNTER — Telehealth (HOSPITAL_COMMUNITY): Payer: Self-pay

## 2020-06-15 ENCOUNTER — Telehealth (HOSPITAL_COMMUNITY): Payer: Self-pay | Admitting: *Deleted

## 2020-06-15 ENCOUNTER — Other Ambulatory Visit
Admission: RE | Admit: 2020-06-15 | Discharge: 2020-06-15 | Disposition: A | Discharge status: Home / Self Care | Payer: 59 | Source: Ambulatory Visit | Attending: Cardiology | Admitting: Cardiology

## 2020-06-15 DIAGNOSIS — I5032 Chronic diastolic (congestive) heart failure: Secondary | ICD-10-CM

## 2020-06-15 LAB — BASIC METABOLIC PANEL
Anion gap: 13 (ref 5–15)
BUN: 61 mg/dL — ABNORMAL HIGH (ref 6–20)
CO2: 33 mmol/L — ABNORMAL HIGH (ref 22–32)
Calcium: 9.1 mg/dL (ref 8.9–10.3)
Chloride: 94 mmol/L — ABNORMAL LOW (ref 98–111)
Creatinine, Ser: 1.99 mg/dL — ABNORMAL HIGH (ref 0.61–1.24)
GFR, Estimated: 38 mL/min — ABNORMAL LOW (ref >60)
Glucose, Bld: 210 mg/dL — ABNORMAL HIGH (ref 70–99)
Potassium: 3.2 mmol/L — ABNORMAL LOW (ref 3.5–5.1)
Sodium: 140 mmol/L (ref 135–145)

## 2020-06-15 MED ORDER — POTASSIUM CHLORIDE ER 10 MEQ PO CPCR: 70.0000 meq | ORAL_CAPSULE | Freq: Two times a day (BID) | ORAL | 3 refills | Status: DC

## 2020-06-15 NOTE — Telephone Encounter (Signed)
Paperwork done. Faxed. Mailed copy  Copy for scan Copy for patient Copy for billing

## 2020-06-15 NOTE — Telephone Encounter (Signed)
-----  Message from [name], MD sent at 06/15/2020  4:42 PM EDT ----- Increase total daily KCl by 20 mEq, BMET in 10 days.

## 2020-06-15 NOTE — Telephone Encounter (Signed)
Patient advised and verbalized understanding. Patient states he was only taking 96mq instead of 657m bid of potassium bid. Med list updated to reflect changes. Patient [place]  Meds ordered this encounter  Medications  . potassium chloride (MICRO-K) 10 MEQ CR capsule    Sig: Take 7 capsules (70 mEq total) by mouth 2 (two) times daily. Take an extra tab on Tue and Fri with metolazone    Dispense:  360 capsule    Refill:  3    Please cancel all previous orders for current medication. Change in dosage or pill size.  Pt will call for refill   Orders Placed This Encounter  Procedures  . Basic metabolic panel    Standing Status:   Future    Standing Expiration Date:   06/15/2021    Order Specific Question:   Release to patient    Answer:   Immediate

## 2020-06-15 NOTE — Addendum Note (Signed)
Addended by: Santiago Bur on: 06/15/2020 12:18 PM   Modules accepted: Orders

## 2020-06-15 NOTE — Telephone Encounter (Signed)
Received call from Effie with paramedicine stating went to the beach and forgot his diuretics and his weight was up 7lbs. Pt resumed diuretics Tuesday and weight is down 4lbs. Pt wanted IV lasix. Per Adline Potter and Dr.McLean no IV lasix take a dose of metolazone today and have lab work drawn at [facility]. Kristi aware and will follow up with pt. Lab order placed for pt.

## 2020-06-15 NOTE — Telephone Encounter (Signed)
Contacted [name] to see how he was feeling and get his weight.  He states after taking his normal dose of metolazone yesterday he lost 1 more lbs.  He states still feel full in abdomen.  He is down 4 lbs of 7 lbs he went up this weekend.  Contacted HF clinic and they advised for him to get blood work done at [facility] today and take another metolazone today.  Advised him same, he appeared to understand.  I advised him he could contact HF clinic tomorrow if still up tomorrow.   [place] [phone]

## 2020-06-19 ENCOUNTER — Other Ambulatory Visit: Payer: Self-pay | Admitting: Internal Medicine

## 2020-06-22 ENCOUNTER — Telehealth (HOSPITAL_COMMUNITY): Payer: Self-pay

## 2020-06-22 NOTE — Progress Notes (Signed)
Cardiology Office Note:    Date:  06/24/2020   ID:  Carl Fjelstad., DOB [birth year], MRN [id number]  PCP:  [name], MD  [facility] Cardiologist:  [name], MD  [facility] Electrophysiologist:  None   Referring MD: [name], MD   Chief Complaint: 6 month follow-up  History of Present Illness:    Carl Frandy Basnett. is a 60 y.o. male with a hx of CAD s/p CABG in 2005, chronic A. fib on Eliquis, ESRD status post renal transplant 8657, chronic diastolic CHF.  Patient is followed by advanced heart failure clinic.  He has a history of ischemic cardiomyopathy with EF as low as 35 to 40% with his last echo 05/25/2018 showing EF 60 to 65% with evidence of RV failure with pulmonary hypertension by echo.  He was admitted 05/15/2019 with acute on chronic diastolic heart failure and diuresed.  Was transitioned to torsemide 80/40 mg.  Discharge weight 252 pounds.  Follow-up appointment BP meds cut back due to orthostatic hypotension and worsening renal function.  Was last seen 04/03/2020 and was overall feeling fair.  Reds clip(?vest) 38%  Admitted 04/2020 for acute gout flare.  Recently had some volume overload and was told to take extra metolazone x 2 and went went down about 7lbs  Today, the patient reports dizziness when he stands up that he feels he is going to pass out. This started when he started working out about 6 weeks ago. He does not feel he is not overexerting himself. He says he is staying hydrated during work-up. He is eating a little bit before. Takes medications (antihypertensives) early in the morning and then works out at about 10-:30-11AM. No chest pain. Some SOB when they changed tadalfil from daily to every other day (unsure when this was). Says his oxygen sometimes drops. Reports dry weight is 254. Today 259lbs.   Redsvest 34% Orthostatics negative  Potassium low and Potassium was increased. Will check a BMET.     Past Medical  History:  Diagnosis Date  . Allergy    seasonal  . Amnestic MCI (mild cognitive impairment with memory loss)   . Asthma   . Benign neoplasm of colon   . CAD (coronary artery disease) 2005   a.  Status post four-vessel CABG in 2005; b. 03/2018 MV: Small, fixed inferoapical and apical septal defect. No ischemia. EF 47% (60-65% by 05/2018 Echo).  . Chronic atrial fibrillation ([place])    a. CHADS2VASc 4 (CHF, HTN, DM, vascular disease)--> Eliquis; b. 09/2018 Zio: Chronic Afib.  . Chronic combined systolic and diastolic CHF (congestive heart failure) ([place])    a.  7/18 Echo: EF 45-50%; b. 05/2018 Echo: EF 60-65%, RVSP 74.8; c. 12/2018 Echo: EF 50-55%. Sev dil LA.  Marland Kitchen Chronic venous stasis dermatitis of both lower extremities   . Cytomegaloviral disease ([place]) 2017  . Depression   . Diabetes mellitus   . Diverticulosis   . Dyspnea   . Erectile dysfunction   . ESRD (end stage renal disease) ([place]) 2005   a. HD 2004-2012; b. S/p renal transplant in 2012  . GERD (gastroesophageal reflux disease)   . Gout   . Hyperlipidemia    low since renal failure  . Hypertension   . Kidney transplant status, cadaveric 2012  . Obesity   . PAH (pulmonary artery hypertension) ([place])    a. 05/2018 Echo: RVSP 74.36mHg; b. 12/2018 RV not well visualized.  . Pneumonia   . Purpura ([place]   .  Sleep apnea    bipap with oxygen 2 l  . Trigger finger   . Ulcer 05/2016   Left shin  . Wears glasses     Past Surgical History:  Procedure Laterality Date  . BARIATRIC SURGERY    . CARDIAC CATHETERIZATION     [facility]  . CARDIAC CATHETERIZATION  05/03/2019  . CATARACT EXTRACTION W/PHACO Right 10/29/2017   Procedure: CATARACT EXTRACTION PHACO AND INTRAOCULAR LENS PLACEMENT ([place])  RIGHT DIABETIC;  Surgeon: [name], MD;  Location: [place];  Service: Ophthalmology;  Laterality: Right;  Diabetic - insulin  . CATARACT EXTRACTION W/PHACO Left 11/18/2017   Procedure: CATARACT EXTRACTION PHACO AND INTRAOCULAR  LENS PLACEMENT ([place]) LEFT IVA/TOPICAL;  Surgeon: [name], MD;  Location: [place];  Service: Ophthalmology;  Laterality: Left;  DIABETES - insulin sleep apnea  . COLONOSCOPY WITH PROPOFOL N/A 04/22/2013   Procedure: COLONOSCOPY WITH PROPOFOL;  Surgeon: [name], MD;  Location: [facility];  Service: Endoscopy;  Laterality: N/A;  . CORONARY ARTERY BYPASS GRAFT  2005   X 4  . DG ANGIO AV SHUNT*L*     right and left upper arms  . FASCIOTOMY  03/03/2012   Procedure: FASCIOTOMY;  Surgeon: [name], MD;  Location: [place];  Service: Orthopedics;  Laterality: Right;  FASCIOTOMY RIGHT SMALL FINGER  . FASCIOTOMY Left 08/17/2013   Procedure: FASCIOTOMY LEFT RING;  Surgeon: [name], MD;  Location: [name];  Service: Orthopedics;  Laterality: Left;  . INCISION AND DRAINAGE ABSCESS Left 10/15/2015   Procedure: INCISION AND DRAINAGE ABSCESS;  Surgeon: [name], MD;  Location: [facility];  Service: General;  Laterality: Left;  . KIDNEY TRANSPLANT  09/13/2010   cadaver--at [facility]  . RIGHT HEART CATH N/A 11/15/2016   Procedure: RIGHT HEART CATH;  Surgeon: [name], MD;  Location: [place] [facility];  Service: Cardiovascular;  Laterality: N/A;  . RIGHT HEART CATH N/A 05/03/2019   Procedure: RIGHT HEART CATH;  Surgeon: [name], MD;  Location: [place] [facility];  Service: Cardiovascular;  Laterality: N/A;  . TYMPANIC MEMBRANE REPAIR  1/12   left  . VASECTOMY      Current Medications: Current Meds  Medication Sig  . beclomethasone (QVAR) 80 MCG/ACT inhaler Inhale 2 puffs into the lungs 2 (two) times daily.   . colchicine 0.6 MG tablet Take 0.6 mg by mouth daily as needed.  . febuxostat (ULORIC) 40 MG tablet Take 40 mg by mouth daily.  Marland Kitchen FERREX 150 150 MG capsule Take 150 mg by mouth daily.   Marland Kitchen gabapentin (NEURONTIN) 300 MG capsule Take 300 mg by mouth 2 (two) times daily.  Marland Kitchen HUMALOG KWIKPEN 100 UNIT/ML KwikPen  Inject 5-10 Units into the skin 3 (three) times daily. Sliding scale  . insulin glargine (LANTUS) 100 unit/mL SOPN Inject 40 Units into the skin at bedtime.   . metolazone (ZAROXOLYN) 5 MG tablet Take 1 tablet (5 mg total) by mouth 2 (two) times a week. On tuesdays Sundays  . mometasone (NASONEX) 50 MCG/ACT nasal spray Place 2 sprays into the nose daily as needed.  . Multiple Vitamin (MULTIVITAMIN WITH MINERALS) TABS tablet Take 1 tablet by mouth daily.  . mycophenolate (MYFORTIC) 180 MG EC tablet Take 360 mg by mouth 2 (two) times daily.  . potassium chloride (MICRO-K) 10 MEQ CR capsule Take 7 capsules (70 mEq total) by mouth 2 (two) times daily. Take an extra tab on Tue and  Fri with metolazone  . tacrolimus (PROGRAF) 1 MG capsule 3 mg in AM and 26m in PM  . tamsulosin (FLOMAX) 0.4 MG CAPS capsule Take 0.4 mg by mouth daily.   . Torsemide 40 MG TABS Take 200 mg by mouth in the morning and at bedtime.  . vitamin C (ASCORBIC ACID) 250 MG tablet Take 1 tablet (250 mg total) by mouth daily.  .Marland Kitchenzinc sulfate 220 (50 Zn) MG capsule Take 1 capsule (220 mg total) by mouth 2 (two) times daily.  . [DISCONTINUED] buPROPion (WELLBUTRIN SR) 150 MG 12 hr tablet Take 1 tablet (150 mg total) by mouth 2 (two) times daily.  . [DISCONTINUED] dapagliflozin propanediol (FARXIGA) 10 MG TABS tablet Take 1 tablet (10 mg total) by mouth daily before breakfast.  . [DISCONTINUED] diclofenac Sodium (VOLTAREN) 1 % GEL Apply 2 g topically 3 (three) times daily as needed.  . [DISCONTINUED] ELIQUIS 5 MG TABS tablet TAKE 1 TABLET BY MOUTH TWICE DAILY  . [DISCONTINUED] fluticasone (FLONASE) 50 MCG/ACT nasal spray PLACE 2 SPRAYS INTO BOTH NOSTRILS DAILY.  . [DISCONTINUED] lidocaine (LIDODERM) 5 % Place 1 patch onto the skin every 12 (twelve) hours. Remove & Discard patch within 12 hours or as directed by MD  . [DISCONTINUED] montelukast (SINGULAIR) 10 MG tablet TAKE 1 TABLET BY MOUTH AT BEDTIME.  . [DISCONTINUED] omeprazole  (PRILOSEC) 20 MG capsule Take 1 capsule (20 mg total) by mouth daily.  . [DISCONTINUED] predniSONE (DELTASONE) 20 MG tablet Take 1 tablet (20 mg total) by mouth daily.  . [DISCONTINUED] rosuvastatin (CRESTOR) 10 MG tablet TAKE 1 TABLET BY MOUTH DAILY  . [DISCONTINUED] tadalafil, PAH, (ADCIRCA) 20 MG tablet Take 1 tablet (20 mg total) by mouth every other day.  . [DISCONTINUED] temazepam (RESTORIL) 15 MG capsule Take 1-2 capsules (15-30 mg total) by mouth at bedtime as needed for sleep.     Allergies:   Contrast media [iodinated diagnostic agents] and Ibuprofen   Social History   Socioeconomic History  . Marital status: Married    Spouse name: Not on file  . Number of children: 2  . Years of education: Not on file  . Highest education level: Not on file  Occupational History  . Occupation: MWarden/ranger unemployed    Comment: disabled due to kidney failure  Tobacco Use  . Smoking status: Former Smoker    Types: Cigars    Quit date: 09/02/1994    Years since quitting: 25.8  . Smokeless tobacco: Never Used  Vaping Use  . Vaping Use: Never used  Substance and Sexual Activity  . Alcohol use: Yes    Alcohol/week: 0.0 - 1.0 standard drinks    Comment: occasional- 3 in the past year  . Drug use: No  . Sexual activity: Not on file  Other Topics Concern  . Not on file  Social History Narrative   In school for culinary arts      Has living will   Wife is health care POA   Would accept resuscitation attempts   Hasn't considered tube feedings   Social Determinants of Health   Financial Resource Strain: Not on file  Food Insecurity: Not on file  Transportation Needs: Not on file  Physical Activity: Not on file  Stress: Not on file  Social Connections: Not on file     Family History: The patient's family history includes Breast cancer in his maternal grandmother; Diabetes in his maternal grandmother; Heart disease in his father; Kidney disease in  his father; Kidney failure in his father; Liver cancer in his paternal grandmother and paternal uncle; Valvular heart disease in his mother. There is no history of Prostate cancer.  ROS:   Please see the history of present illness.     All other systems reviewed and are negative.  EKGs/Labs/Other Studies Reviewed:    The following studies were reviewed today:  Echo 04/2019 1. Left ventricular ejection fraction, by estimation, is 45 to 50%. The  left ventricle has mildly decreased function. The left ventrical  demonstrates global hypokinesis. The left ventricular internal cavity size  was mildly dilated. Left ventricular  diastolic parameters are indeterminate.  2. Right ventricular systolic function is moderately reduced. The right  ventricular size is moderately enlarged. There is moderately elevated  pulmonary artery systolic pressure. The estimated right ventricular  systolic pressure is 23.5 mmHg. D-shaped  interventricular septum suggests RV pressure/volume overload.  3. The aortic valve is tricuspid. No significant regurgitation or  stenosis.  4. The mitral valve is degenerative. No significant regurgitation or  stenosis.  5. Dilated IVC suggestive of RA pressure 15 mmHg.   [name] 04/2019 Conclusion  1. Severely elevated right and left heart filling pressures.  2. Severe pulmonary hypertension, likely primarily pulmonary venous hypertension.  3. Preserved cardiac output.     EKG:  EKG is  ordered today.  The ekg ordered today demonstrates Afib, LVH, 83bpm, no sig change  Recent Labs: 08/04/2019: ALT 19 05/14/2020: Magnesium 1.7 05/17/2020: Hemoglobin 13.6; Platelets 189 06/15/2020: BUN 61; Creatinine, Ser 1.99; Potassium 3.2; Sodium 140  Recent Lipid Panel    Component Value Date/Time   CHOL 89 10/09/2017 0000   CHOL 161 03/07/2015 0823   TRIG 73 03/06/2016 0736   HDL 34 (L) 03/06/2016 0736   HDL 38 (L) 03/07/2015 0823   CHOLHDL 2.7 03/06/2016 0736   VLDL 15  03/06/2016 0736   LDLCALC 49 10/09/2017 0000   LDLCALC 105 (H) 03/07/2015 0823   LDLDIRECT 41.9 04/20/2012 1618   Physical Exam:    VS:  BP 110/74 (BP Location: Right Arm, Patient Position: Sitting, Cuff Size: Normal)   Pulse 81   Ht 5' 10.5" (1.791 m)   Wt 267 lb 8 oz (121.3 kg)   SpO2 93%   BMI 37.84 kg/m     Wt Readings from Last 3 Encounters:  06/23/20 267 lb 8 oz (121.3 kg)  06/14/20 264 lb (119.7 kg)  05/25/20 269 lb (122 kg)     GEN:  Well nourished, well developed in no acute distress HEENT: Normal NECK: No JVD; No carotid bruits LYMPHATICS: No lymphadenopathy CARDIAC: Irreg Irreg, no murmurs, rubs, gallops RESPIRATORY:  Clear to auscultation without rales, wheezing or rhonchi  ABDOMEN: Soft, non-tender, non-distended MUSCULOSKELETAL:  No edema; No deformity  SKIN: Warm and dry NEUROLOGIC:  Alert and oriented x 3 PSYCHIATRIC:  Normal affect   ASSESSMENT:    1. Chronic diastolic heart failure ([place])   2. OSA treated with BiPAP   3. CAD in native artery   4. Chronic atrial fibrillation (HCC)   5. Pulmonary hypertension due to left heart disease ([place])   6. Hyperlipidemia, unspecified hyperlipidemia type   7. Essential hypertension   8. Dizziness    PLAN:    In order of problems listed above:  CHF with low normal EF 40-45% RV failure and pulmonary HTN - Echo in 2/21 showed EF 40-45%, moderately dilated RV with moderately decreased systolic function, moderate pulmonary HTN.  - Recently had volume overload and  took extra metolazone x2. Genereally he takes it twice a week (Tuesday and Sunday) - today weight is stable compared to previous visits. At home it is back down to 259lbs. Last CHF noted weight okay around 260lbs. - Euvolemic on exam. RedsVest 34% - continue current Torsemide dose - continue dapagliflozin 55m daily - Follows with advanced heart failure team - No changes today  Permanent A. Fib - Rate controlled today - No BB due to history of  bradycardia - Continue Eliquis   CAD status post CABG 2005 -Low risk Cardiolite in 03/2018 - patient denies chest pain, has chronic SOB - No ASA with ELiquis - continue statin  CKD stage III status post renal transplant 2012 - Most recent labs showed stable kidney function  OSA - continue Bipap  Dizziness HTN - reported dizziness while working out, when changing positions - orthostatics negative in the office - BP otherwise good, no changes - recommended good hydration and caution with overexertion  Pulmonary hypertension - followed by Advanced CHF team - tadalafil  Hypokalemia - BMET today   Disposition: Follow up in 3 month(s) with MD   Signed, Ramah Langhans HNinfa Meeker PA-C  06/24/2020 11:41 AM    [place]

## 2020-06-22 NOTE — Telephone Encounter (Signed)
Today contacted [name] to see how he was doing.  His weight has come down to 259 lbs now.  He states feeling better.  He is still going to gym everyday.  He cooks most of his food.  He is making soup today without salt.  Advised him to be cautious with fluids, he states he will cut back on fluids he drinks.  He is aware of potassium dose change, he has made the change.  Will check on him Monday to make sure he is still coming down with his weight. He denies any chest pain or discomfort, edema, abdominal fullness, headaches or dizziness.    [place] [phone]

## 2020-06-23 ENCOUNTER — Other Ambulatory Visit: Payer: Self-pay | Admitting: Family

## 2020-06-23 ENCOUNTER — Other Ambulatory Visit: Payer: Self-pay

## 2020-06-23 ENCOUNTER — Ambulatory Visit (INDEPENDENT_AMBULATORY_CARE_PROVIDER_SITE_OTHER): Payer: 59 | Admitting: Medical

## 2020-06-23 ENCOUNTER — Encounter: Payer: Self-pay | Admitting: Medical

## 2020-06-23 ENCOUNTER — Other Ambulatory Visit: Payer: Self-pay | Admitting: Medical

## 2020-06-23 ENCOUNTER — Telehealth: Payer: Self-pay | Admitting: Medical

## 2020-06-23 VITALS — BP 110/74 | HR 81 | Ht 70.5 in | Wt 267.5 lb

## 2020-06-23 DIAGNOSIS — E785 Hyperlipidemia, unspecified: Secondary | ICD-10-CM | POA: Diagnosis not present

## 2020-06-23 DIAGNOSIS — G4733 Obstructive sleep apnea (adult) (pediatric): Secondary | ICD-10-CM

## 2020-06-23 DIAGNOSIS — R42 Dizziness and giddiness: Secondary | ICD-10-CM | POA: Diagnosis not present

## 2020-06-23 DIAGNOSIS — I5032 Chronic diastolic (congestive) heart failure: Secondary | ICD-10-CM | POA: Diagnosis not present

## 2020-06-23 DIAGNOSIS — I10 Essential (primary) hypertension: Secondary | ICD-10-CM

## 2020-06-23 DIAGNOSIS — I2722 Pulmonary hypertension due to left heart disease: Secondary | ICD-10-CM | POA: Diagnosis not present

## 2020-06-23 DIAGNOSIS — I2510 Atherosclerotic heart disease of native coronary artery without angina pectoris: Secondary | ICD-10-CM | POA: Diagnosis not present

## 2020-06-23 DIAGNOSIS — I482 Chronic atrial fibrillation, unspecified: Secondary | ICD-10-CM

## 2020-06-23 NOTE — Telephone Encounter (Signed)
Please evaluate for refill at appointment today--3:00 PM with Cadence, PA. Thanks!

## 2020-06-23 NOTE — Patient Instructions (Signed)
Medication Instructions:  No changes  *If you need a refill on your cardiac medications before your next appointment, please call your pharmacy*   Lab Work: None  If you have labs (blood work) drawn today and your tests are completely normal, you will receive your results only by: Marland Kitchen MyChart Message (if you have MyChart) OR . A paper copy in the mail If you have any lab test that is abnormal or we need to change your treatment, we will call you to review the results.   Testing/Procedures: None   Follow-Up: At [facility], you and your health needs are our priority.  As part of our continuing mission to provide you with exceptional heart care, we have created designated Provider Care Teams.  These Care Teams include your primary Cardiologist (physician) and Advanced Practice Providers (APPs -  Physician Assistants and Nurse Practitioners) who all work together to provide you with the care you need, when you need it.   Your next appointment:   6 month(s)  The format for your next appointment:   In Person  Provider:   You may see [name], MD or one of the following Advanced Practice Providers on your designated Care Team:    Murray Hodgkins, NP  Christell Faith, PA-C  Marrianne Mood, PA-C  Cadence Gary, [place]  Laurann [place], NP

## 2020-06-23 NOTE — Telephone Encounter (Signed)
Spoke with patient and reviewed that we needed updated labs done. He is no longer here in the hospital and requested to have them done on Monday. Orders entered for repeat BMET and he states that he will come on Monday to have those done. Instructed him to check in at the [facility] registration desk and they will tell him where to go to have those drawn. He verbalized understanding and had no further questions at this time.

## 2020-06-24 ENCOUNTER — Other Ambulatory Visit: Payer: Self-pay

## 2020-06-24 ENCOUNTER — Encounter: Payer: Self-pay | Admitting: Medical

## 2020-06-24 MED FILL — Insulin Glargine-yfgn Soln Pen-Injector 100 Unit/ML: SUBCUTANEOUS | 30 days supply | Qty: 15 | Fill #0 | Status: AC

## 2020-06-25 ENCOUNTER — Other Ambulatory Visit: Payer: Self-pay

## 2020-06-25 DIAGNOSIS — G4733 Obstructive sleep apnea (adult) (pediatric): Secondary | ICD-10-CM | POA: Diagnosis not present

## 2020-06-25 DIAGNOSIS — E662 Morbid (severe) obesity with alveolar hypoventilation: Secondary | ICD-10-CM | POA: Diagnosis not present

## 2020-06-26 ENCOUNTER — Other Ambulatory Visit: Payer: Self-pay

## 2020-06-26 ENCOUNTER — Other Ambulatory Visit
Admission: RE | Admit: 2020-06-26 | Discharge: 2020-06-26 | Disposition: A | Discharge status: Home / Self Care | Payer: 59 | Source: Ambulatory Visit | Attending: Medical | Admitting: Medical

## 2020-06-26 DIAGNOSIS — I5032 Chronic diastolic (congestive) heart failure: Secondary | ICD-10-CM | POA: Insufficient documentation

## 2020-06-26 LAB — BASIC METABOLIC PANEL
Anion gap: 9 (ref 5–15)
BUN: 94 mg/dL — ABNORMAL HIGH (ref 6–20)
CO2: 34 mmol/L — ABNORMAL HIGH (ref 22–32)
Calcium: 9.8 mg/dL (ref 8.9–10.3)
Chloride: 100 mmol/L (ref 98–111)
Creatinine, Ser: 2.6 mg/dL — ABNORMAL HIGH (ref 0.61–1.24)
GFR, Estimated: 27 mL/min — ABNORMAL LOW (ref >60)
Glucose, Bld: 75 mg/dL (ref 70–99)
Potassium: 3.5 mmol/L (ref 3.5–5.1)
Sodium: 143 mmol/L (ref 135–145)

## 2020-06-26 MED FILL — Tacrolimus Cap 1 MG: ORAL | 30 days supply | Qty: 150 | Fill #0 | Status: AC

## 2020-06-26 MED FILL — Tadalafil Tab 20 MG (PAH): ORAL | 30 days supply | Qty: 15 | Fill #0 | Status: CN

## 2020-06-26 MED FILL — Sulfamethoxazole-Trimethoprim Tab 400-80 MG: ORAL | 84 days supply | Qty: 36 | Fill #0 | Status: AC

## 2020-06-26 MED FILL — Tadalafil Tab 20 MG (PAH): ORAL | 30 days supply | Qty: 15 | Fill #0 | Status: AC

## 2020-06-26 MED FILL — Rosuvastatin Calcium Tab 10 MG: ORAL | 90 days supply | Qty: 90 | Fill #0 | Status: CN

## 2020-06-27 ENCOUNTER — Other Ambulatory Visit: Payer: Self-pay

## 2020-06-28 ENCOUNTER — Telehealth: Payer: Self-pay

## 2020-06-28 DIAGNOSIS — R799 Abnormal finding of blood chemistry, unspecified: Secondary | ICD-10-CM

## 2020-06-28 DIAGNOSIS — E1165 Type 2 diabetes mellitus with hyperglycemia: Secondary | ICD-10-CM | POA: Diagnosis not present

## 2020-06-28 NOTE — Telephone Encounter (Signed)
Attempted to reach out to pt, unable to reach him, called wife [name] (DPR approved). Reviewed recent labs with her advised by Kathlen Mody, PA-C  "labs showed normal potassium but also showed he might be dry with worseining creatinine/BUN. Please tell him to hold Torsemide for 1 day and drinl some extra fluid. He needs repeat BMET on Friday"  [name] verbalized understanding, wrote instructions down to inform pt, she will have him repeat labs as well at [facility] this Friday or Monday. Otherwise all questions or concerns were address and no additional concerns at this time. Agreeable to plan, will call back for anything further.

## 2020-06-30 ENCOUNTER — Other Ambulatory Visit: Payer: Self-pay | Admitting: Internal Medicine

## 2020-06-30 ENCOUNTER — Other Ambulatory Visit (HOSPITAL_COMMUNITY): Payer: Self-pay | Admitting: Cardiology

## 2020-06-30 ENCOUNTER — Other Ambulatory Visit: Payer: Self-pay

## 2020-06-30 ENCOUNTER — Other Ambulatory Visit
Admission: RE | Admit: 2020-06-30 | Discharge: 2020-06-30 | Disposition: A | Discharge status: Home / Self Care | Payer: 59 | Attending: Medical | Admitting: Medical

## 2020-06-30 ENCOUNTER — Telehealth: Payer: Self-pay | Admitting: *Deleted

## 2020-06-30 DIAGNOSIS — R799 Abnormal finding of blood chemistry, unspecified: Secondary | ICD-10-CM | POA: Diagnosis not present

## 2020-06-30 DIAGNOSIS — I5032 Chronic diastolic (congestive) heart failure: Secondary | ICD-10-CM

## 2020-06-30 DIAGNOSIS — Z79899 Other long term (current) drug therapy: Secondary | ICD-10-CM

## 2020-06-30 DIAGNOSIS — J309 Allergic rhinitis, unspecified: Secondary | ICD-10-CM

## 2020-06-30 LAB — BASIC METABOLIC PANEL
Anion gap: 9 (ref 5–15)
BUN: 60 mg/dL — ABNORMAL HIGH (ref 6–20)
CO2: 28 mmol/L (ref 22–32)
Calcium: 9.7 mg/dL (ref 8.9–10.3)
Chloride: 107 mmol/L (ref 98–111)
Creatinine, Ser: 2.12 mg/dL — ABNORMAL HIGH (ref 0.61–1.24)
GFR, Estimated: 35 mL/min — ABNORMAL LOW (ref >60)
Glucose, Bld: 142 mg/dL — ABNORMAL HIGH (ref 70–99)
Potassium: 4.1 mmol/L (ref 3.5–5.1)
Sodium: 144 mmol/L (ref 135–145)

## 2020-06-30 MED ORDER — FLUTICASONE PROPIONATE 50 MCG/ACT NA SUSP
2.0000 | Freq: Every day | NASAL | 11 refills | Status: DC
  Filled 2020-06-30: qty 16, 30d supply, fill #0
  Filled 2020-12-20: qty 16, 30d supply, fill #1
  Filled 2021-03-13: qty 16, 30d supply, fill #2
  Filled 2021-05-14: qty 16, 30d supply, fill #3
  Filled 2021-06-22: qty 16, 30d supply, fill #4

## 2020-06-30 MED ORDER — POTASSIUM CHLORIDE ER 10 MEQ PO CPCR
ORAL_CAPSULE | ORAL | 3 refills | Status: DC
  Filled 2020-06-30: qty 360, 30d supply, fill #0

## 2020-06-30 MED FILL — Insulin Lispro Soln Pen-injector 100 Unit/ML (1 Unit Dial): SUBCUTANEOUS | 90 days supply | Qty: 45 | Fill #0 | Status: AC

## 2020-06-30 MED FILL — Omeprazole Cap Delayed Release 20 MG: ORAL | 90 days supply | Qty: 90 | Fill #0 | Status: AC

## 2020-06-30 MED FILL — Febuxostat Tab 80 MG: ORAL | 90 days supply | Qty: 90 | Fill #0 | Status: CN

## 2020-06-30 MED FILL — Polysaccharide Iron Complex Cap 150 MG (Iron Equivalent): ORAL | 30 days supply | Qty: 60 | Fill #0 | Status: AC

## 2020-06-30 NOTE — Telephone Encounter (Signed)
-----  Message from [place], PA-C sent at 06/30/2020  3:01 PM EDT ----- Please call patient and inform him labs showed improved kidney function. WE will continue current torsemide/metolazone dose and re-check BMET in 1 week. Thanks

## 2020-06-30 NOTE — Telephone Encounter (Signed)
Pt notified of lab results and provider's recc.  Pt verbalized understanding. He will continue current dose of Torsemide/Metolazone. He will repeat BMET at the [facility] [facility] in 1 week.  Lab orders placed.  Pt has no further questions at this time.

## 2020-07-01 ENCOUNTER — Other Ambulatory Visit: Payer: Self-pay

## 2020-07-03 ENCOUNTER — Other Ambulatory Visit: Payer: Self-pay

## 2020-07-03 DIAGNOSIS — H903 Sensorineural hearing loss, bilateral: Secondary | ICD-10-CM | POA: Diagnosis not present

## 2020-07-04 ENCOUNTER — Other Ambulatory Visit: Payer: Self-pay

## 2020-07-04 ENCOUNTER — Other Ambulatory Visit: Payer: Self-pay | Admitting: Cardiovascular Disease

## 2020-07-04 MED ORDER — ELIQUIS 5 MG PO TABS
ORAL_TABLET | Freq: Two times a day (BID) | ORAL | 1 refills | Status: DC
  Filled 2020-07-04: qty 180, 90d supply, fill #0
  Filled 2020-10-20: qty 180, 90d supply, fill #1

## 2020-07-04 MED FILL — Dapagliflozin Propanediol Tab 10 MG (Base Equivalent): ORAL | 30 days supply | Qty: 30 | Fill #0 | Status: AC

## 2020-07-04 MED FILL — Tacrolimus Cap 0.5 MG: ORAL | 30 days supply | Qty: 30 | Fill #0 | Status: AC

## 2020-07-04 NOTE — Telephone Encounter (Signed)
Prescription refill request for Eliquis received. Indication: Atrial Fib Last office visit: 06/23/20 Scr: 2.12 on 06/30/20 Age: 60 Weight: 121.3kg  Based on above findings Eliquis 79m twice daily is the appropriate dose.  Refill approved.

## 2020-07-04 NOTE — Telephone Encounter (Signed)
Refill Request.

## 2020-07-05 ENCOUNTER — Other Ambulatory Visit: Payer: Self-pay

## 2020-07-05 MED ORDER — FEBUXOSTAT 80 MG PO TABS
ORAL_TABLET | ORAL | 1 refills | Status: DC
  Filled 2020-07-05 – 2020-10-06 (×3): qty 90, 90d supply, fill #0
  Filled 2020-12-11: qty 90, 90d supply, fill #1

## 2020-07-07 ENCOUNTER — Ambulatory Visit: Payer: 59

## 2020-07-10 ENCOUNTER — Other Ambulatory Visit: Payer: Self-pay

## 2020-07-11 ENCOUNTER — Other Ambulatory Visit
Admission: RE | Admit: 2020-07-11 | Discharge: 2020-07-11 | Disposition: A | Discharge status: Home / Self Care | Payer: 59 | Source: Ambulatory Visit | Attending: Medical | Admitting: Medical

## 2020-07-11 ENCOUNTER — Other Ambulatory Visit: Payer: Self-pay

## 2020-07-11 DIAGNOSIS — I5032 Chronic diastolic (congestive) heart failure: Secondary | ICD-10-CM | POA: Diagnosis not present

## 2020-07-11 DIAGNOSIS — Z79899 Other long term (current) drug therapy: Secondary | ICD-10-CM | POA: Diagnosis not present

## 2020-07-11 LAB — BASIC METABOLIC PANEL
Anion gap: 11 (ref 5–15)
BUN: 56 mg/dL — ABNORMAL HIGH (ref 6–20)
CO2: 32 mmol/L (ref 22–32)
Calcium: 9.6 mg/dL (ref 8.9–10.3)
Chloride: 98 mmol/L (ref 98–111)
Creatinine, Ser: 1.69 mg/dL — ABNORMAL HIGH (ref 0.61–1.24)
GFR, Estimated: 46 mL/min — ABNORMAL LOW (ref >60)
Glucose, Bld: 95 mg/dL (ref 70–99)
Potassium: 3.3 mmol/L — ABNORMAL LOW (ref 3.5–5.1)
Sodium: 141 mmol/L (ref 135–145)

## 2020-07-12 ENCOUNTER — Telehealth: Payer: Self-pay | Admitting: *Deleted

## 2020-07-12 ENCOUNTER — Other Ambulatory Visit: Payer: Self-pay

## 2020-07-12 DIAGNOSIS — E876 Hypokalemia: Secondary | ICD-10-CM

## 2020-07-12 MED FILL — Febuxostat Tab 80 MG: ORAL | 90 days supply | Qty: 90 | Fill #0 | Status: CN

## 2020-07-12 MED FILL — Febuxostat Tab 80 MG: ORAL | 90 days supply | Qty: 90 | Fill #0 | Status: AC

## 2020-07-12 NOTE — Telephone Encounter (Signed)
Reviewed follow up labs. Potassium low at 3.3.  Pt reports he takes Potassium 33mq BID, however, he went on vacation over the weekend and did not bring enough tablets with him.  So on Sunday and Monday 4/17 and 4/18 he only took Potassium 482m BID while continuing fluid pills (Torsemide and Metolazone). Reports he is now back home and on regular dose of 7025mBID of Potassium. Discussed with Jacquelyn in office.  Per recc notified pt to continue regular dose Potassium 88m68mID and repeat BMET in 1 week.  Notified pt that his renal function has improved.  Will forward to Cadence to follow up.  Pt verbalized understanding. Lab orders placed.

## 2020-07-12 NOTE — Telephone Encounter (Signed)
-----  Message from Jeannette How sent at 07/12/2020  3:12 PM EDT ----- Durene Cal to [facility]

## 2020-07-13 ENCOUNTER — Telehealth (HOSPITAL_COMMUNITY): Payer: Self-pay

## 2020-07-13 NOTE — Telephone Encounter (Signed)
Today spoke with [name], he states doing well, he feels best he has in a long time.  He still going to gym to work out.  Discussed him making a list when goes on vacation to make sure he has enough medications.  He is aware of getting blood work next week and aware of up coming appts.  He states he has everything he needs.  Will make a home visit coming up.   [name] [phone]

## 2020-07-17 ENCOUNTER — Emergency Department: Payer: 59

## 2020-07-17 ENCOUNTER — Other Ambulatory Visit: Payer: Self-pay

## 2020-07-17 ENCOUNTER — Emergency Department
Admission: EM | Admit: 2020-07-17 | Discharge: 2020-07-17 | Disposition: A | Discharge status: Home / Self Care | Payer: 59 | Attending: Emergency Medicine | Admitting: Emergency Medicine

## 2020-07-17 ENCOUNTER — Encounter: Payer: Self-pay | Admitting: Emergency Medicine

## 2020-07-17 DIAGNOSIS — Z86018 Personal history of other benign neoplasm: Secondary | ICD-10-CM | POA: Insufficient documentation

## 2020-07-17 DIAGNOSIS — Z7984 Long term (current) use of oral hypoglycemic drugs: Secondary | ICD-10-CM | POA: Diagnosis not present

## 2020-07-17 DIAGNOSIS — Z794 Long term (current) use of insulin: Secondary | ICD-10-CM | POA: Insufficient documentation

## 2020-07-17 DIAGNOSIS — E1122 Type 2 diabetes mellitus with diabetic chronic kidney disease: Secondary | ICD-10-CM | POA: Diagnosis not present

## 2020-07-17 DIAGNOSIS — J4540 Moderate persistent asthma, uncomplicated: Secondary | ICD-10-CM | POA: Insufficient documentation

## 2020-07-17 DIAGNOSIS — N186 End stage renal disease: Secondary | ICD-10-CM | POA: Insufficient documentation

## 2020-07-17 DIAGNOSIS — Z7951 Long term (current) use of inhaled steroids: Secondary | ICD-10-CM | POA: Diagnosis not present

## 2020-07-17 DIAGNOSIS — Z951 Presence of aortocoronary bypass graft: Secondary | ICD-10-CM | POA: Diagnosis not present

## 2020-07-17 DIAGNOSIS — Z87891 Personal history of nicotine dependence: Secondary | ICD-10-CM | POA: Insufficient documentation

## 2020-07-17 DIAGNOSIS — I5042 Chronic combined systolic (congestive) and diastolic (congestive) heart failure: Secondary | ICD-10-CM | POA: Insufficient documentation

## 2020-07-17 DIAGNOSIS — I132 Hypertensive heart and chronic kidney disease with heart failure and with stage 5 chronic kidney disease, or end stage renal disease: Secondary | ICD-10-CM | POA: Insufficient documentation

## 2020-07-17 DIAGNOSIS — Z7901 Long term (current) use of anticoagulants: Secondary | ICD-10-CM | POA: Diagnosis not present

## 2020-07-17 DIAGNOSIS — Z992 Dependence on renal dialysis: Secondary | ICD-10-CM | POA: Insufficient documentation

## 2020-07-17 DIAGNOSIS — M7989 Other specified soft tissue disorders: Secondary | ICD-10-CM | POA: Diagnosis not present

## 2020-07-17 DIAGNOSIS — Z79899 Other long term (current) drug therapy: Secondary | ICD-10-CM | POA: Diagnosis not present

## 2020-07-17 DIAGNOSIS — I82811 Embolism and thrombosis of superficial veins of right lower extremities: Secondary | ICD-10-CM | POA: Insufficient documentation

## 2020-07-17 DIAGNOSIS — I2510 Atherosclerotic heart disease of native coronary artery without angina pectoris: Secondary | ICD-10-CM | POA: Diagnosis not present

## 2020-07-17 DIAGNOSIS — R600 Localized edema: Secondary | ICD-10-CM | POA: Diagnosis present

## 2020-07-17 LAB — CBC
HCT: 43.9 % (ref 39.0–52.0)
Hemoglobin: 13.3 g/dL (ref 13.0–17.0)
MCH: 23.5 pg — ABNORMAL LOW (ref 26.0–34.0)
MCHC: 30.3 g/dL (ref 30.0–36.0)
MCV: 77.6 fL — ABNORMAL LOW (ref 80.0–100.0)
Platelets: 148 10*3/uL — ABNORMAL LOW (ref 150–400)
RBC: 5.66 MIL/uL (ref 4.22–5.81)
RDW: 18.2 % — ABNORMAL HIGH (ref 11.5–15.5)
WBC: 6.3 10*3/uL (ref 4.0–10.5)
nRBC: 0 % (ref 0.0–0.2)

## 2020-07-17 LAB — BASIC METABOLIC PANEL
Anion gap: 8 (ref 5–15)
BUN: 62 mg/dL — ABNORMAL HIGH (ref 6–20)
CO2: 36 mmol/L — ABNORMAL HIGH (ref 22–32)
Calcium: 9.4 mg/dL (ref 8.9–10.3)
Chloride: 97 mmol/L — ABNORMAL LOW (ref 98–111)
Creatinine, Ser: 1.75 mg/dL — ABNORMAL HIGH (ref 0.61–1.24)
GFR, Estimated: 44 mL/min — ABNORMAL LOW (ref >60)
Glucose, Bld: 212 mg/dL — ABNORMAL HIGH (ref 70–99)
Potassium: 4.6 mmol/L (ref 3.5–5.1)
Sodium: 141 mmol/L (ref 135–145)

## 2020-07-17 NOTE — ED Triage Notes (Signed)
States he hit his right lower leg about 1 week ago  Area is red and swollen

## 2020-07-17 NOTE — ED Provider Notes (Signed)
[facility] Emergency Department Provider Note  ____________________________________________   Event Date/Time   First MD Initiated Contact with Patient 07/17/20 0818     (approximate)  I have reviewed the triage vital signs and the nursing notes.   HISTORY  Chief Complaint Leg Pain    HPI [name]. is a 60 y.o. male presents emergency department complaint of redness and swelling to the right lower leg for about a week.  States the dog hit the front of his leg and had a swollen area and [name] has a red warm area more medially.   Patient has a history of a kidney transplant, diabetes and other past medical history see below   Past Medical History:  Diagnosis Date  . Allergy    seasonal  . Amnestic MCI (mild cognitive impairment with memory loss)   . Asthma   . Benign neoplasm of colon   . CAD (coronary artery disease) 2005   a.  Status post four-vessel CABG in 2005; b. 03/2018 MV: Small, fixed inferoapical and apical septal defect. No ischemia. EF 47% (60-65% by 05/2018 Echo).  . Chronic atrial fibrillation ([place])    a. CHADS2VASc 4 (CHF, HTN, DM, vascular disease)--> Eliquis; b. 09/2018 Zio: Chronic Afib.  . Chronic combined systolic and diastolic CHF (congestive heart failure) ([place])    a.  7/18 Echo: EF 45-50%; b. 05/2018 Echo: EF 60-65%, RVSP 74.8; c. 12/2018 Echo: EF 50-55%. Sev dil LA.  Marland Kitchen Chronic venous stasis dermatitis of both lower extremities   . Cytomegaloviral disease ([place]) 2017  . Depression   . Diabetes mellitus   . Diverticulosis   . Dyspnea   . Erectile dysfunction   . ESRD (end stage renal disease) ([place]) 2005   a. HD 2004-2012; b. S/p renal transplant in 2012  . GERD (gastroesophageal reflux disease)   . Gout   . Hyperlipidemia    low since renal failure  . Hypertension   . Kidney transplant status, cadaveric 2012  . Obesity   . PAH (pulmonary artery hypertension) ([place])    a. 05/2018 Echo: RVSP 74.44mHg; b. 12/2018 RV  not well visualized.  . Pneumonia   . Purpura ([place]   . Sleep apnea    bipap with oxygen 2 l  . Trigger finger   . Ulcer 05/2016   Left shin  . Wears glasses     Patient Active Problem List   Diagnosis Date Noted  . Acute gout of right elbow 05/25/2020  . Erectile dysfunction due to arterial insufficiency 10/20/2019  . Chronic venous embolism and thrombosis of deep veins of upper extremity ([place] 07/23/2019  . Sleep disturbance 07/23/2019  . AV fistula occlusion ([place] 07/12/2019  . Permanent atrial fibrillation ([place]   . Atypical pneumonia 07/01/2019  . Synovitis of finger 06/11/2019  . Pulmonary hypertension due to left heart disease ([place] 01/20/2019  . Trigger finger, unspecified little finger 12/23/2018  . Superficial thrombophlebitis 12/23/2018  . SOB (shortness of breath) 11/06/2018  . CHF (congestive heart failure) ([place] 11/05/2018  . MDD (major depressive disorder), single episode, mild ([place] 04/29/2018  . Gout attack 03/30/2018  . Cervical sprain, initial encounter 03/02/2018  . Moderate persistent asthma 05/21/2017  . Chronic diastolic heart failure ([place] 11/19/2016  . Chronic venous insufficiency 06/05/2016  . Proteinuria 06/23/2015  . BPH with obstruction/lower urinary tract symptoms 09/25/2014  . Hypogonadism in male 09/25/2014  . Immunosuppressed status ([place] 09/09/2014  . Type 2 diabetes mellitus with hyperglycemia, with long-term  current use of insulin ([place]) 09/09/2014  . Chronic atrial fibrillation ([place]) 07/18/2014  . DM eye manif type II 10/27/2013  . MDD (recurrent major depressive disorder) in remission ([place]) 10/04/2013  . Benign neoplasm of colon 04/22/2013  . Diverticulosis of colon (without mention of hemorrhage) 04/22/2013  . History of renal transplant 03/01/2013  . Asthma, persistent controlled 02/25/2013  . Erectile dysfunction 10/20/2012  . Obstructive sleep apnea 09/29/2012  . Obesity hypoventilation syndrome ([place]) 03/31/2012  . Routine  general medical examination at a health care facility 11/12/2011  . Type 2 diabetes, controlled, with renal manifestation ([place]) 03/01/2011  . Essential hypertension 12/17/2010  . Coronary artery disease involving native coronary artery of native heart without angina pectoris 12/17/2010  . ESRD (end stage renal disease) [facility])     Past Surgical History:  Procedure Laterality Date  . BARIATRIC SURGERY    . CARDIAC CATHETERIZATION     [facility]  . CARDIAC CATHETERIZATION  05/03/2019  . CATARACT EXTRACTION W/PHACO Right 10/29/2017   Procedure: CATARACT EXTRACTION PHACO AND INTRAOCULAR LENS PLACEMENT ([place])  RIGHT DIABETIC;  Surgeon: [name], MD;  Location: [place];  Service: Ophthalmology;  Laterality: Right;  Diabetic - insulin  . CATARACT EXTRACTION W/PHACO Left 11/18/2017   Procedure: CATARACT EXTRACTION PHACO AND INTRAOCULAR LENS PLACEMENT ([place]) LEFT IVA/TOPICAL;  Surgeon: [name], MD;  Location: [place];  Service: Ophthalmology;  Laterality: Left;  DIABETES - insulin sleep apnea  . COLONOSCOPY WITH PROPOFOL N/A 04/22/2013   Procedure: COLONOSCOPY WITH PROPOFOL;  Surgeon: [name], MD;  [facility];  Service: Endoscopy;  Laterality: N/A;  . CORONARY ARTERY BYPASS GRAFT  2005   X 4  . DG ANGIO AV SHUNT*L*     right and left upper arms  . FASCIOTOMY  03/03/2012   Procedure: FASCIOTOMY;  Surgeon: [name], MD;  Location: [place];  Service: Orthopedics;  Laterality: Right;  FASCIOTOMY RIGHT SMALL FINGER  . FASCIOTOMY Left 08/17/2013   Procedure: FASCIOTOMY LEFT RING;  Surgeon: [name], MD;  Location: [place];  Service: Orthopedics;  Laterality: Left;  . INCISION AND DRAINAGE ABSCESS Left 10/15/2015   Procedure: INCISION AND DRAINAGE ABSCESS;  Surgeon: [name], MD;  Location: [facility];  Service: General;  Laterality: Left;  . KIDNEY TRANSPLANT  09/13/2010   cadaver--at [facility]  . RIGHT  HEART CATH N/A 11/15/2016   Procedure: RIGHT HEART CATH;  Surgeon: [name], MD;  Location: [place] [facility];  Service: Cardiovascular;  Laterality: N/A;  . RIGHT HEART CATH N/A 05/03/2019   Procedure: RIGHT HEART CATH;  Surgeon: [name], MD;  Location: [place] [facility];  Service: Cardiovascular;  Laterality: N/A;  . TYMPANIC MEMBRANE REPAIR  1/12   left  . VASECTOMY      Prior to Admission medications   Medication Sig Start Date End Date Taking? Authorizing Provider  amoxicillin (AMOXIL) 500 MG capsule TAKE 4 CAPSULES (2,000 MG TOTAL) BY MOUTH 1 HOUR BEFORE DENTAL APPOINTMENT. 02/11/20 02/10/21  Belinda Block, MD  apixaban (ELIQUIS) 5 MG TABS tablet TAKE 1 TABLET BY MOUTH TWICE DAILY 07/04/20 07/04/21  [name], MD  beclomethasone (QVAR) 80 MCG/ACT inhaler Inhale 2 puffs into the lungs 2 (two) times daily.  10/01/13   [provider]  buPROPion (WELLBUTRIN SR) 150 MG 12 hr tablet TAKE 1 TABLET BY MOUTH TWICE DAILY 05/24/20 05/24/21  [name], MD  colchicine 0.6 MG tablet  Take 0.6 mg by mouth daily as needed. 10/21/19   [provider]  dapagliflozin propanediol (FARXIGA) 10 MG TABS tablet TAKE 1 TABLET (10 MG TOTAL) BY MOUTH DAILY BEFORE BREAKFAST. 03/07/20 03/07/21  [name], MD  diclofenac Sodium (VOLTAREN) 1 % GEL APPLY 2 GRAMS TOPICALLY 3 TIMES DAILY AS NEEDED. 03/07/20 03/07/21  Pleas Koch, NP  febuxostat (ULORIC) 40 MG tablet Take 40 mg by mouth daily. 01/31/20   [provider]  febuxostat (ULORIC) 40 MG tablet TAKE 1 TABLET (40 MG TOTAL) BY MOUTH ONCE DAILY 01/31/20 01/30/21  Posey Pronto, Mayur K, MD  febuxostat (ULORIC) 40 MG tablet TAKE 1 TABLET BY MOUTH ONCE DAILY 10/22/19 10/21/20  Quintin Alto, MD  Febuxostat 80 MG TABS TAKE 1 TABLET BY MOUTH ONCE DAILY 02/15/20 02/14/21  Quintin Alto, MD  Febuxostat 80 MG TABS Take 1 tablet (80 mg total) by mouth once daily for 180 days 07/05/20     FERREX 150 150 MG  capsule Take 150 mg by mouth daily.  09/23/19   [provider]  fluticasone (FLONASE) 50 MCG/ACT nasal spray PLACE 2 SPRAYS INTO BOTH NOSTRILS DAILY. 06/30/20 06/30/21  [name], MD  gabapentin (NEURONTIN) 300 MG capsule Take 300 mg by mouth 2 (two) times daily.  12/01/18 12/01/19  [provider]  gabapentin (NEURONTIN) 300 MG capsule Take 300 mg by mouth 2 (two) times daily. 11/01/19   [provider]  gabapentin (NEURONTIN) 300 MG capsule TAKE 1 CAPSULE BY MOUTH TWICE DAILY 11/01/19 10/31/20  Solum, Betsey Holiday, MD  HUMALOG KWIKPEN 100 UNIT/ML KwikPen Inject 5-10 Units into the skin 3 (three) times daily. Sliding scale 09/01/18   [provider]  insulin glargine (LANTUS) 100 UNIT/ML Solostar Pen INJECT 40 UNITS SUBCUTANEOUSLY NIGHTLY 09/24/19 09/23/20  Solum, Betsey Holiday, MD  insulin glargine (LANTUS) 100 unit/mL SOPN Inject 40 Units into the skin at bedtime.     [provider]  Insulin Glargine-yfgn 100 UNIT/ML SOPN INJECT 40 UNITS SUBCUTANEOUSLY ONCE DAILY 06/19/20 06/19/21  Judi Cong, MD  insulin lispro (HUMALOG) 100 UNIT/ML KwikPen INJECT UP TO 50 UNITS UNDER THE SKIN DAILY AS DIRECTED 04/25/20 04/25/21  Judi Cong, MD  insulin lispro (HUMALOG) 100 UNIT/ML KwikPen INJECT UP TO 50 UNITS UNDER THE SKIN DAILY AS DIRECTED 10/06/19 10/05/20  Solum, Betsey Holiday, MD  Insulin Pen Needle 31G X 5 MM MISC USE AS DIRECTED FOUR TIMES DAILY. 01/19/20 01/18/21  Judi Cong, MD  iron polysaccharides (NIFEREX) 150 MG capsule TAKE 1 CAPSULE BY MOUTH 2 TIMES DAILY. 02/09/20 02/08/21  Belinda Block, MD  lidocaine (LIDODERM) 5 % PLACE 1 PATCH ONTO THE SKIN FOR 12 (TWELVE) HOURS. REMOVE & DISCARD PATCH WITHIN 12 HOURS OR AS DIRECTED BY MD 05/25/20 05/25/21  [name] I, MD  metolazone (ZAROXOLYN) 2.5 MG tablet TAKE 2 TABLETS (5 MG) BY MOUTH ONCE A WEEK. 12/09/19 12/08/20  [name], MD  metolazone (ZAROXOLYN) 5 MG tablet Take 1 tablet (5 mg total) by mouth 2 (two) times a week. On  tuesdays Sundays 03/09/20   [name], MD  metolazone (ZAROXOLYN) 5 MG tablet TAKE 1 TABLET (5 MG TOTAL) BY MOUTH 2 (TWO) TIMES A WEEK. EVERY TUESDAY AND FRIDAY 02/15/20 02/14/21  [name], MD  mometasone (NASONEX) 50 MCG/ACT nasal spray Place 2 sprays into the nose daily as needed.    [provider]  montelukast (SINGULAIR) 10 MG tablet TAKE 1 TABLET BY MOUTH AT BEDTIME.  08/18/19 08/17/20  [name], MD  Multiple Vitamin (MULTIVITAMIN WITH MINERALS) TABS tablet Take 1 tablet by mouth daily.    [provider]  mycophenolate (MYFORTIC) 180 MG EC tablet Take 360 mg by mouth 2 (two) times daily.    [provider]  omeprazole (PRILOSEC) 20 MG capsule TAKE 1 CAPSULE BY MOUTH DAILY. 10/05/19 10/04/20  [name], MD  potassium chloride (MICRO-K) 10 MEQ CR capsule Take 7 capsules (70 mEq total) by mouth 2 (two) times daily. Take an extra tab on Tue and Fri with metolazone 06/15/20   [name], MD  potassium chloride (MICRO-K) 10 MEQ CR capsule TAKE 6 CAPSULES (60 MEQ) BY MOUTH 2 TIMES DAILY. TAKE AN EXTRA TABLET ON TUEDAY AND FRIDAY WITH METOLAZONE 06/30/20 06/30/21  [name], MD  predniSONE (DELTASONE) 10 MG tablet TAKE 4 TABLETS BY MOUTH ONCE DAILY FOR 3 DAYS, 3 TABS DAILY FOR 3 DAYS, 2 TABS DAILY FOR 3 DAYS, THEN 1 TAB DAILY FOR 3 DAYS. 05/31/20 05/31/21  Patel, Mayur K, MD  predniSONE (DELTASONE) 10 MG tablet TAKE 4 TABLETS BY MOUTH DAILY FOR 3 DAYS, 3 TABLETS FOR 3 DAYS, 2 TABLETS FOR 3 DAYS, THEN 1 TABLETS FOR 3 DAYS 05/01/20 05/01/21  Patel, Mayur K, MD  predniSONE (DELTASONE) 10 MG tablet TAKE 4 TABLETS BY MOUTH DAILY FOR 3 DAYS, 3 TABLETS FOR 3 DAYS, 2 TABLETS FOR 3 DAYS, THEN 1 TABLET FOR 3 DAYS 03/01/20 03/01/21  Patel, Mayur K, MD  predniSONE (DELTASONE) 10 MG tablet TAKE 4 TABLETS BY MOUTH DAILY FOR 3 DAYS, 3 TABLETS DAILY FOR 3 DAYS, 2 TABLETS FOR 3 DAYS, THEN 1 TABLET FOR 3 DAYS 12/29/19 12/28/20  Patel, Mayur K, MD  predniSONE (DELTASONE) 20  MG tablet TAKE 1 TABLET (20 MG TOTAL) BY MOUTH DAILY. 05/25/20 05/25/21  [name] I, MD  predniSONE (DELTASONE) 20 MG tablet TAKE 1 TABLET BY MOUTH DAILY WITH BREAKFAST FOR 5 DAYS. 05/17/20 05/17/21  Ralene Muskrat B, MD  rosuvastatin (CRESTOR) 10 MG tablet TAKE 1 TABLET BY MOUTH DAILY 06/23/20 06/23/21  Furth, Cadence H, PA-C  sulfamethoxazole-trimethoprim (BACTRIM) 400-80 MG tablet TAKE 1 TABLET BY MOUTH EVERY MONDAY, [place], FRIDAY. 03/06/20 03/06/21  Belinda Block, MD  tacrolimus (PROGRAF) 0.5 MG capsule TAKE 1 CAPSULE (0.5 MG TOTAL) BY MOUTH EVERY EVENING. 05/12/20 05/12/21  Close, Lorne Skeens, NP  tacrolimus (PROGRAF) 1 MG capsule 3 mg in AM and 63m in PM    [provider]  tacrolimus (PROGRAF) 1 MG capsule TAKE 2 TABLETS (2MG) BY MOUTH DAILY IN THE MORNING AND 2 TABLETS AT NIGHT. 10/21/19 10/20/20  Moraitis, LFarley Ly PA-C  tacrolimus (PROGRAF) 1 MG capsule TAKE 3 MG (3 CAPSULES) IN THE MORNING AND 2 MG (2 CAPSULES) AT NIGHT. 08/11/19 08/10/20  JBelinda Block MD  tadalafil, PAH, (ADCIRCA) 20 MG tablet TAKE 1 TABLET (20 MG TOTAL) BY MOUTH EVERY OTHER DAY. 01/28/20 01/27/21  AWellington Hampshire MD  tamsulosin (FLOMAX) 0.4 MG CAPS capsule Take 0.4 mg by mouth daily.     [provider]  tamsulosin (FLOMAX) 0.4 MG CAPS capsule TAKE 1 CAPSULE BY MOUTH DAILY. 05/23/20 05/23/21  JBelinda Block MD  temazepam (RESTORIL) 15 MG capsule TAKE 1-2 CAPSULES (15-30 MG TOTAL) BY MOUTH AT BEDTIME AS NEEDED FOR SLEEP. 05/25/20 11/21/20  LViviana SimplerI, MD  torsemide (DEMADEX) 20 MG tablet TAKE 2 TABLETS (40 MG) BY MOUTH DAILY FOR 5 DAYS. CAN RESUME PREVIOUS REGIMEN AFTER THAT DATE  OR SOONER IF YOU ARE BUILDING UP FLUID 05/17/20 05/17/21  Ralene Muskrat B, MD  Torsemide 40 MG TABS Take 200 mg by mouth in the morning and at bedtime.    [provider]  vitamin C (ASCORBIC ACID) 250 MG tablet Take 1 tablet (250 mg total) by mouth daily. 07/04/19   Max Sane, MD  zinc sulfate  220 (50 Zn) MG capsule Take 1 capsule (220 mg total) by mouth 2 (two) times daily. 07/04/19   Max Sane, MD    Allergies Contrast media [iodinated diagnostic agents] and Ibuprofen  Family History  Problem Relation Age of Onset  . Heart disease Father   . Kidney failure Father   . Kidney disease Father   . Diabetes Maternal Grandmother   . Breast cancer Maternal Grandmother   . Valvular heart disease Mother   . Liver cancer Paternal Uncle   . Liver cancer Paternal Grandmother   . Prostate cancer Neg Hx     Social History Social History   Tobacco Use  . Smoking status: Former Smoker    Types: Cigars    Quit date: 09/02/1994    Years since quitting: 25.8  . Smokeless tobacco: Never Used  Vaping Use  . Vaping Use: Never used  Substance Use Topics  . Alcohol use: Yes    Alcohol/week: 0.0 - 1.0 standard drinks    Comment: occasional- 3 in the past year  . Drug use: No    Review of Systems  Constitutional: No fever/chills Eyes: No visual changes. ENT: No sore throat. Respiratory: Denies cough Cardiovascular: Denies chest pain Gastrointestinal: Denies abdominal pain Genitourinary: Negative for dysuria. Musculoskeletal: Negative for back pain.  Positive for right lower leg pain Skin: Negative for rash. Psychiatric: no mood changes,     ____________________________________________   PHYSICAL EXAM:  VITAL SIGNS: ED Triage Vitals  Enc Vitals Group     BP 07/17/20 0817 (!) 167/82     Pulse Rate 07/17/20 0817 61     Resp 07/17/20 0817 18     Temp 07/17/20 0817 97.8 F (36.6 C)     Temp Source 07/17/20 0817 Oral     SpO2 07/17/20 0817 98 %     Weight 07/17/20 0810 266 lb 12.1 oz (121 kg)     Height 07/17/20 0810 5' 10.5" (1.791 m)     Head Circumference --      Peak Flow --      Pain Score 07/17/20 0815 6     Pain Loc --      Pain Edu? --      Excl. in [place]? --     Constitutional: Alert and oriented. Well appearing and in no acute distress. Eyes:  Conjunctivae are normal.  Head: Atraumatic. Nose: No congestion/rhinnorhea. Mouth/Throat: Mucous membranes are moist.   Neck:  supple no lymphadenopathy noted Cardiovascular: Normal rate, regular rhythm.  Respiratory: Normal respiratory effort.  No retractions, GU: deferred Musculoskeletal: FROM all extremities, warm and well perfused, right lower leg has some redness and swelling medially, questionable varicose vein and phlebitis versus infection and clot Neurologic:  Normal speech and language.  Skin:  Skin is warm, dry and intact. No rash noted. Psychiatric: Mood and affect are normal. Speech and behavior are normal.  ____________________________________________   LABS (all labs ordered are listed, but only abnormal results are displayed)  Labs Reviewed  CBC - Abnormal; Notable for the following components:      Result Value   MCV 77.6 (*)  MCH 23.5 (*)    RDW 18.2 (*)    Platelets 148 (*)    All other components within normal limits  BASIC METABOLIC PANEL   ____________________________________________   ____________________________________________  RADIOLOGY  Ultrasound right lower extremity  ____________________________________________   PROCEDURES  Procedure(s) performed: No  Procedures    ____________________________________________   INITIAL IMPRESSION / ASSESSMENT AND PLAN / ED COURSE  Pertinent labs & imaging results that were available during my care of the patient were reviewed by me and considered in my medical decision making (see chart for details).   Patient 61 year old male presents with right lower leg redness and swelling.  See HPI.  Physical exam shows patient be stable  Ultrasound right lower extremity to rule out DVT, most likely this is a phlebitis.  Anticipate patient be discharged.   Ultrasound showed a superficial thrombosis.  Did explain to the patient that he can resolve this with warm compresses.  He is to call his kidney  transplant team today.  Labs were drawn so they would have more information.  Usually would tell the patient to take NSAIDs but due to his kidney transplant this is not appropriate this time.  He is to return if the area is worsening.  I did explain to him the superficial thrombus can continue to press on the deeper veins and cause a DVT.  He is to closely watch the area for more swelling or pain.  He states he understands.  Is discharged stable condition.  Hosteen Jaiveer Panas. was evaluated in Emergency Department on 07/17/2020 for the symptoms described in the history of present illness. He was evaluated in the context of the global COVID-19 pandemic, which necessitated consideration that the patient might be at risk for infection with the SARS-CoV-2 virus that causes COVID-19. Institutional protocols and algorithms that pertain to the evaluation of patients at risk for COVID-19 are in a state of rapid change based on information released by regulatory bodies including the CDC and federal and state organizations. These policies and algorithms were followed during the patient's care in the ED.    As part of my medical decision making, I reviewed the following data within the [place] notes reviewed and incorporated, Old chart reviewed, Radiograph reviewed , Notes from prior ED visits and [place] Controlled Substance Database  ____________________________________________   FINAL CLINICAL IMPRESSION(S) / ED DIAGNOSES  Final diagnoses:  Venous embolism and thrombosis of superficial vessels of right lower extremity      NEW MEDICATIONS STARTED DURING THIS VISIT:  New Prescriptions   No medications on file     Note:  This document was prepared using Dragon voice recognition software and may include unintentional dictation errors.    Versie Starks, PA-C 07/17/20 1043    Naaman Plummer, MD 07/17/20 [phone]

## 2020-07-17 NOTE — Discharge Instructions (Signed)
Call your transplant team today.  Let them know that you have a superficial thrombus in the right lower leg.  We have advised warm compresses.  If you are worsening please return emergency department

## 2020-07-18 ENCOUNTER — Telehealth: Payer: Self-pay

## 2020-07-18 NOTE — Telephone Encounter (Signed)
Left message on VM per DPR to see how he is doing after his ER visit yesterday for leg pain. I asked if he would call or send a MyChart message with an update, we would appreciate it.

## 2020-07-21 ENCOUNTER — Other Ambulatory Visit: Payer: Self-pay

## 2020-07-21 MED FILL — Amoxicillin (Trihydrate) Cap 500 MG: ORAL | 1 days supply | Qty: 4 | Fill #0 | Status: AC

## 2020-07-22 ENCOUNTER — Inpatient Hospital Stay
Admission: EM | Admit: 2020-07-22 | Discharge: 2020-07-24 | DRG: 603 | Disposition: A | Discharge status: Home / Self Care | Payer: 59 | Attending: Hospitalist | Admitting: Hospitalist

## 2020-07-22 ENCOUNTER — Other Ambulatory Visit: Payer: Self-pay

## 2020-07-22 DIAGNOSIS — Z794 Long term (current) use of insulin: Secondary | ICD-10-CM

## 2020-07-22 DIAGNOSIS — Z7901 Long term (current) use of anticoagulants: Secondary | ICD-10-CM

## 2020-07-22 DIAGNOSIS — I5042 Chronic combined systolic (congestive) and diastolic (congestive) heart failure: Secondary | ICD-10-CM | POA: Diagnosis not present

## 2020-07-22 DIAGNOSIS — G4733 Obstructive sleep apnea (adult) (pediatric): Secondary | ICD-10-CM | POA: Diagnosis not present

## 2020-07-22 DIAGNOSIS — L03114 Cellulitis of left upper limb: Secondary | ICD-10-CM | POA: Diagnosis not present

## 2020-07-22 DIAGNOSIS — M109 Gout, unspecified: Secondary | ICD-10-CM | POA: Diagnosis present

## 2020-07-22 DIAGNOSIS — K219 Gastro-esophageal reflux disease without esophagitis: Secondary | ICD-10-CM | POA: Diagnosis present

## 2020-07-22 DIAGNOSIS — E1122 Type 2 diabetes mellitus with diabetic chronic kidney disease: Secondary | ICD-10-CM | POA: Diagnosis present

## 2020-07-22 DIAGNOSIS — F32A Depression, unspecified: Secondary | ICD-10-CM | POA: Diagnosis present

## 2020-07-22 DIAGNOSIS — E119 Type 2 diabetes mellitus without complications: Secondary | ICD-10-CM | POA: Diagnosis not present

## 2020-07-22 DIAGNOSIS — Z87891 Personal history of nicotine dependence: Secondary | ICD-10-CM

## 2020-07-22 DIAGNOSIS — Z20822 Contact with and (suspected) exposure to covid-19: Secondary | ICD-10-CM | POA: Diagnosis present

## 2020-07-22 DIAGNOSIS — Z886 Allergy status to analgesic agent status: Secondary | ICD-10-CM

## 2020-07-22 DIAGNOSIS — L03116 Cellulitis of left lower limb: Secondary | ICD-10-CM | POA: Diagnosis present

## 2020-07-22 DIAGNOSIS — Z8249 Family history of ischemic heart disease and other diseases of the circulatory system: Secondary | ICD-10-CM

## 2020-07-22 DIAGNOSIS — I482 Chronic atrial fibrillation, unspecified: Secondary | ICD-10-CM | POA: Diagnosis not present

## 2020-07-22 DIAGNOSIS — Z800 Family history of malignant neoplasm of digestive organs: Secondary | ICD-10-CM

## 2020-07-22 DIAGNOSIS — Z940 Kidney transplant status: Secondary | ICD-10-CM

## 2020-07-22 DIAGNOSIS — Z951 Presence of aortocoronary bypass graft: Secondary | ICD-10-CM | POA: Diagnosis not present

## 2020-07-22 DIAGNOSIS — Z86718 Personal history of other venous thrombosis and embolism: Secondary | ICD-10-CM | POA: Diagnosis not present

## 2020-07-22 DIAGNOSIS — L02416 Cutaneous abscess of left lower limb: Secondary | ICD-10-CM

## 2020-07-22 DIAGNOSIS — I878 Other specified disorders of veins: Secondary | ICD-10-CM | POA: Diagnosis present

## 2020-07-22 DIAGNOSIS — Z833 Family history of diabetes mellitus: Secondary | ICD-10-CM

## 2020-07-22 DIAGNOSIS — I872 Venous insufficiency (chronic) (peripheral): Secondary | ICD-10-CM | POA: Diagnosis not present

## 2020-07-22 DIAGNOSIS — W548XXA Other contact with dog, initial encounter: Secondary | ICD-10-CM | POA: Diagnosis not present

## 2020-07-22 DIAGNOSIS — I480 Paroxysmal atrial fibrillation: Secondary | ICD-10-CM | POA: Diagnosis present

## 2020-07-22 DIAGNOSIS — Z803 Family history of malignant neoplasm of breast: Secondary | ICD-10-CM

## 2020-07-22 DIAGNOSIS — Z91041 Radiographic dye allergy status: Secondary | ICD-10-CM

## 2020-07-22 DIAGNOSIS — I2510 Atherosclerotic heart disease of native coronary artery without angina pectoris: Secondary | ICD-10-CM | POA: Diagnosis not present

## 2020-07-22 DIAGNOSIS — E876 Hypokalemia: Secondary | ICD-10-CM | POA: Diagnosis not present

## 2020-07-22 DIAGNOSIS — Z7952 Long term (current) use of systemic steroids: Secondary | ICD-10-CM

## 2020-07-22 DIAGNOSIS — I130 Hypertensive heart and chronic kidney disease with heart failure and stage 1 through stage 4 chronic kidney disease, or unspecified chronic kidney disease: Secondary | ICD-10-CM | POA: Diagnosis present

## 2020-07-22 DIAGNOSIS — N400 Enlarged prostate without lower urinary tract symptoms: Secondary | ICD-10-CM | POA: Diagnosis not present

## 2020-07-22 DIAGNOSIS — N1831 Chronic kidney disease, stage 3a: Secondary | ICD-10-CM | POA: Diagnosis present

## 2020-07-22 DIAGNOSIS — E785 Hyperlipidemia, unspecified: Secondary | ICD-10-CM | POA: Diagnosis present

## 2020-07-22 DIAGNOSIS — I5032 Chronic diastolic (congestive) heart failure: Secondary | ICD-10-CM | POA: Diagnosis present

## 2020-07-22 DIAGNOSIS — Z9884 Bariatric surgery status: Secondary | ICD-10-CM | POA: Diagnosis not present

## 2020-07-22 DIAGNOSIS — Z841 Family history of disorders of kidney and ureter: Secondary | ICD-10-CM

## 2020-07-22 DIAGNOSIS — Z79899 Other long term (current) drug therapy: Secondary | ICD-10-CM

## 2020-07-22 DIAGNOSIS — I5022 Chronic systolic (congestive) heart failure: Secondary | ICD-10-CM | POA: Diagnosis present

## 2020-07-22 HISTORY — DX: Cellulitis of left lower limb: L03.116

## 2020-07-22 HISTORY — DX: Cutaneous abscess of left lower limb: L02.416

## 2020-07-22 LAB — CBC WITH DIFFERENTIAL/PLATELET
Abs Immature Granulocytes: 0.02 10*3/uL (ref 0.00–0.07)
Basophils Absolute: 0 10*3/uL (ref 0.0–0.1)
Basophils Relative: 1 %
Eosinophils Absolute: 0.1 10*3/uL (ref 0.0–0.5)
Eosinophils Relative: 1 %
HCT: 41 % (ref 39.0–52.0)
Hemoglobin: 12.3 g/dL — ABNORMAL LOW (ref 13.0–17.0)
Immature Granulocytes: 0 %
Lymphocytes Relative: 17 %
Lymphs Abs: 1.1 10*3/uL (ref 0.7–4.0)
MCH: 23.3 pg — ABNORMAL LOW (ref 26.0–34.0)
MCHC: 30 g/dL (ref 30.0–36.0)
MCV: 77.8 fL — ABNORMAL LOW (ref 80.0–100.0)
Monocytes Absolute: 0.7 10*3/uL (ref 0.1–1.0)
Monocytes Relative: 11 %
Neutro Abs: 4.4 10*3/uL (ref 1.7–7.7)
Neutrophils Relative %: 70 %
Platelets: 146 10*3/uL — ABNORMAL LOW (ref 150–400)
RBC: 5.27 MIL/uL (ref 4.22–5.81)
RDW: 17.7 % — ABNORMAL HIGH (ref 11.5–15.5)
WBC: 6.2 10*3/uL (ref 4.0–10.5)
nRBC: 0 % (ref 0.0–0.2)

## 2020-07-22 LAB — COMPREHENSIVE METABOLIC PANEL
ALT: 14 U/L (ref 0–44)
AST: 21 U/L (ref 15–41)
Albumin: 3.7 g/dL (ref 3.5–5.0)
Alkaline Phosphatase: 86 U/L (ref 38–126)
Anion gap: 12 (ref 5–15)
BUN: 50 mg/dL — ABNORMAL HIGH (ref 6–20)
CO2: 28 mmol/L (ref 22–32)
Calcium: 8.9 mg/dL (ref 8.9–10.3)
Chloride: 100 mmol/L (ref 98–111)
Creatinine, Ser: 2.11 mg/dL — ABNORMAL HIGH (ref 0.61–1.24)
GFR, Estimated: 35 mL/min — ABNORMAL LOW (ref >60)
Glucose, Bld: 168 mg/dL — ABNORMAL HIGH (ref 70–99)
Potassium: 3.2 mmol/L — ABNORMAL LOW (ref 3.5–5.1)
Sodium: 140 mmol/L (ref 135–145)
Total Bilirubin: 0.9 mg/dL (ref 0.3–1.2)
Total Protein: 7.5 g/dL (ref 6.5–8.1)

## 2020-07-22 LAB — MAGNESIUM: Magnesium: 1.9 mg/dL (ref 1.7–2.4)

## 2020-07-22 LAB — CBG MONITORING, ED: Glucose-Capillary: 181 mg/dL — ABNORMAL HIGH (ref 70–99)

## 2020-07-22 LAB — LACTIC ACID, PLASMA: Lactic Acid, Venous: 1.2 mmol/L (ref 0.5–1.9)

## 2020-07-22 LAB — HEMOGLOBIN A1C
Hgb A1c MFr Bld: 8.3 % — ABNORMAL HIGH (ref 4.8–5.6)
Mean Plasma Glucose: 191.51 mg/dL

## 2020-07-22 MED ORDER — PANTOPRAZOLE SODIUM 40 MG PO TBEC
40.0000 mg | DELAYED_RELEASE_TABLET | Freq: Every day | ORAL | Status: DC
  Administered 2020-07-23 – 2020-07-24 (×2): 40 mg via ORAL
  Filled 2020-07-22 (×2): qty 1

## 2020-07-22 MED ORDER — POLYSACCHARIDE IRON COMPLEX 150 MG PO CAPS
150.0000 mg | ORAL_CAPSULE | Freq: Every day | ORAL | Status: DC
  Administered 2020-07-23 – 2020-07-24 (×2): 150 mg via ORAL
  Filled 2020-07-22 (×2): qty 1

## 2020-07-22 MED ORDER — INSULIN GLARGINE 100 UNIT/ML ~~LOC~~ SOLN
20.0000 [IU] | Freq: Every day | SUBCUTANEOUS | Status: DC
  Administered 2020-07-22 – 2020-07-23 (×2): 20 [IU] via SUBCUTANEOUS
  Filled 2020-07-22 (×3): qty 0.2

## 2020-07-22 MED ORDER — APIXABAN 5 MG PO TABS
5.0000 mg | ORAL_TABLET | Freq: Two times a day (BID) | ORAL | Status: DC
  Administered 2020-07-22 – 2020-07-24 (×4): 5 mg via ORAL
  Filled 2020-07-22 (×4): qty 1

## 2020-07-22 MED ORDER — ADULT MULTIVITAMIN W/MINERALS CH
1.0000 | ORAL_TABLET | Freq: Every day | ORAL | Status: DC
  Administered 2020-07-23 – 2020-07-24 (×2): 1 via ORAL
  Filled 2020-07-22 (×3): qty 1

## 2020-07-22 MED ORDER — SODIUM CHLORIDE 0.9% FLUSH
3.0000 mL | INTRAVENOUS | Status: DC | PRN
  Administered 2020-07-24: 3 mL via INTRAVENOUS

## 2020-07-22 MED ORDER — TACROLIMUS 1 MG PO CAPS
2.0000 mg | ORAL_CAPSULE | Freq: Two times a day (BID) | ORAL | Status: DC
  Administered 2020-07-22 – 2020-07-24 (×4): 2 mg via ORAL
  Filled 2020-07-22 (×5): qty 2

## 2020-07-22 MED ORDER — MONTELUKAST SODIUM 10 MG PO TABS
10.0000 mg | ORAL_TABLET | Freq: Every day | ORAL | Status: DC
  Administered 2020-07-22 – 2020-07-23 (×2): 10 mg via ORAL
  Filled 2020-07-22 (×3): qty 1

## 2020-07-22 MED ORDER — MYCOPHENOLATE SODIUM 180 MG PO TBEC
360.0000 mg | DELAYED_RELEASE_TABLET | Freq: Two times a day (BID) | ORAL | Status: DC
  Administered 2020-07-22 – 2020-07-24 (×4): 360 mg via ORAL
  Filled 2020-07-22 (×6): qty 2

## 2020-07-22 MED ORDER — ONDANSETRON HCL 4 MG/2ML IJ SOLN: 4.0000 mg | Freq: Four times a day (QID) | INTRAMUSCULAR | Status: DC | PRN

## 2020-07-22 MED ORDER — TAMSULOSIN HCL 0.4 MG PO CAPS
0.4000 mg | ORAL_CAPSULE | Freq: Every day | ORAL | Status: DC
  Administered 2020-07-23 – 2020-07-24 (×2): 0.4 mg via ORAL
  Filled 2020-07-22 (×2): qty 1

## 2020-07-22 MED ORDER — TORSEMIDE 100 MG PO TABS
200.0000 mg | ORAL_TABLET | Freq: Two times a day (BID) | ORAL | Status: DC
  Administered 2020-07-23 – 2020-07-24 (×3): 200 mg via ORAL
  Filled 2020-07-22 (×5): qty 2

## 2020-07-22 MED ORDER — ASCORBIC ACID 500 MG PO TABS
250.0000 mg | ORAL_TABLET | Freq: Every day | ORAL | Status: DC
  Administered 2020-07-23 – 2020-07-24 (×2): 250 mg via ORAL
  Filled 2020-07-22 (×2): qty 1

## 2020-07-22 MED ORDER — INSULIN GLARGINE 100 UNIT/ML SOLOSTAR PEN: 20.0000 [IU] | PEN_INJECTOR | Freq: Every day | SUBCUTANEOUS | Status: DC

## 2020-07-22 MED ORDER — POTASSIUM CHLORIDE CRYS ER 20 MEQ PO TBCR: 40.0000 meq | EXTENDED_RELEASE_TABLET | Freq: Once | ORAL | Status: DC

## 2020-07-22 MED ORDER — BUPROPION HCL ER (SR) 150 MG PO TB12
150.0000 mg | ORAL_TABLET | Freq: Two times a day (BID) | ORAL | Status: DC
  Administered 2020-07-22 – 2020-07-24 (×4): 150 mg via ORAL
  Filled 2020-07-22 (×5): qty 1

## 2020-07-22 MED ORDER — BUDESONIDE 0.5 MG/2ML IN SUSP
0.5000 mg | Freq: Two times a day (BID) | RESPIRATORY_TRACT | Status: DC
  Filled 2020-07-22 (×2): qty 2

## 2020-07-22 MED ORDER — POLYSACCHARIDE IRON COMPLEX 150 MG PO CAPS: 150.0000 mg | ORAL_CAPSULE | Freq: Two times a day (BID) | ORAL | Status: DC

## 2020-07-22 MED ORDER — TEMAZEPAM 15 MG PO CAPS: 15.0000 mg | ORAL_CAPSULE | Freq: Every evening | ORAL | Status: DC | PRN

## 2020-07-22 MED ORDER — BECLOMETHASONE DIPROPIONATE 80 MCG/ACT IN AERS: 2.0000 | INHALATION_SPRAY | Freq: Two times a day (BID) | RESPIRATORY_TRACT | Status: DC

## 2020-07-22 MED ORDER — GABAPENTIN 100 MG PO CAPS
200.0000 mg | ORAL_CAPSULE | Freq: Two times a day (BID) | ORAL | Status: DC
  Administered 2020-07-22 – 2020-07-24 (×4): 200 mg via ORAL
  Filled 2020-07-22 (×4): qty 2

## 2020-07-22 MED ORDER — VANCOMYCIN HCL IN DEXTROSE 1-5 GM/200ML-% IV SOLN
1000.0000 mg | Freq: Once | INTRAVENOUS | Status: AC
  Administered 2020-07-22: 1000 mg via INTRAVENOUS
  Filled 2020-07-22: qty 200

## 2020-07-22 MED ORDER — FLUTICASONE PROPIONATE 50 MCG/ACT NA SUSP: 1.0000 | Freq: Every day | NASAL | Status: DC

## 2020-07-22 MED ORDER — LACTATED RINGERS IV BOLUS
1000.0000 mL | Freq: Once | INTRAVENOUS | Status: AC
  Administered 2020-07-22: 1000 mL via INTRAVENOUS

## 2020-07-22 MED ORDER — FLUTICASONE PROPIONATE 50 MCG/ACT NA SUSP
2.0000 | Freq: Every day | NASAL | Status: DC
  Administered 2020-07-23: 2 via NASAL
  Filled 2020-07-22: qty 16

## 2020-07-22 MED ORDER — SODIUM CHLORIDE 0.9 % IV SOLN
1.0000 g | Freq: Once | INTRAVENOUS | Status: AC
  Administered 2020-07-22: 1 g via INTRAVENOUS
  Filled 2020-07-22: qty 10

## 2020-07-22 MED ORDER — ROSUVASTATIN CALCIUM 10 MG PO TABS
10.0000 mg | ORAL_TABLET | Freq: Every day | ORAL | Status: DC
  Administered 2020-07-22 – 2020-07-23 (×2): 10 mg via ORAL
  Filled 2020-07-22 (×3): qty 1

## 2020-07-22 MED ORDER — ONDANSETRON HCL 4 MG PO TABS: 4.0000 mg | ORAL_TABLET | Freq: Four times a day (QID) | ORAL | Status: DC | PRN

## 2020-07-22 MED ORDER — CEFAZOLIN SODIUM-DEXTROSE 2-4 GM/100ML-% IV SOLN
2.0000 g | Freq: Three times a day (TID) | INTRAVENOUS | Status: DC
  Filled 2020-07-22 (×4): qty 100

## 2020-07-22 MED ORDER — SODIUM CHLORIDE 0.9% FLUSH
3.0000 mL | Freq: Two times a day (BID) | INTRAVENOUS | Status: DC
  Administered 2020-07-22 – 2020-07-23 (×3): 3 mL via INTRAVENOUS

## 2020-07-22 MED ORDER — FEBUXOSTAT 40 MG PO TABS
80.0000 mg | ORAL_TABLET | Freq: Every day | ORAL | Status: DC
  Administered 2020-07-23 – 2020-07-24 (×2): 80 mg via ORAL
  Filled 2020-07-22 (×2): qty 2

## 2020-07-22 MED ORDER — METOLAZONE 5 MG PO TABS
5.0000 mg | ORAL_TABLET | ORAL | Status: DC
  Administered 2020-07-24: 5 mg via ORAL
  Filled 2020-07-22: qty 1

## 2020-07-22 MED ORDER — INSULIN ASPART 100 UNIT/ML IJ SOLN
0.0000 [IU] | Freq: Three times a day (TID) | INTRAMUSCULAR | Status: DC
  Administered 2020-07-22 – 2020-07-23 (×2): 3 [IU] via SUBCUTANEOUS
  Administered 2020-07-23: 2 [IU] via SUBCUTANEOUS
  Administered 2020-07-23: 3 [IU] via SUBCUTANEOUS
  Administered 2020-07-24: 5 [IU] via SUBCUTANEOUS
  Administered 2020-07-24: 2 [IU] via SUBCUTANEOUS
  Administered 2020-07-24: 5 [IU] via SUBCUTANEOUS
  Filled 2020-07-22 (×7): qty 1

## 2020-07-22 MED ORDER — COLCHICINE 0.6 MG PO TABS
0.6000 mg | ORAL_TABLET | Freq: Every day | ORAL | Status: DC | PRN
  Filled 2020-07-22: qty 1

## 2020-07-22 MED ORDER — ZINC SULFATE 220 (50 ZN) MG PO CAPS
220.0000 mg | ORAL_CAPSULE | Freq: Two times a day (BID) | ORAL | Status: DC
  Administered 2020-07-22 – 2020-07-24 (×4): 220 mg via ORAL
  Filled 2020-07-22 (×5): qty 1

## 2020-07-22 MED ORDER — TADALAFIL (PAH) 20 MG PO TABS
20.0000 mg | ORAL_TABLET | ORAL | Status: DC
  Administered 2020-07-23: 20 mg via ORAL
  Filled 2020-07-22: qty 1

## 2020-07-22 MED ORDER — SODIUM CHLORIDE 0.9 % IV SOLN
250.0000 mL | INTRAVENOUS | Status: DC | PRN
  Administered 2020-07-23: 250 mL via INTRAVENOUS

## 2020-07-22 MED ORDER — ACETAMINOPHEN 325 MG PO TABS
650.0000 mg | ORAL_TABLET | Freq: Four times a day (QID) | ORAL | Status: DC | PRN
  Administered 2020-07-22 – 2020-07-23 (×2): 650 mg via ORAL
  Filled 2020-07-22 (×2): qty 2

## 2020-07-22 MED ORDER — ACETAMINOPHEN 650 MG RE SUPP: 650.0000 mg | Freq: Four times a day (QID) | RECTAL | Status: DC | PRN

## 2020-07-22 NOTE — ED Notes (Signed)
Message sent to pharmacy to verify medications

## 2020-07-22 NOTE — ED Triage Notes (Signed)
Pt to ED for left leg possible infection that started Thursday  Open wound noted to left lower leg with redness and swelling.  + blood thinners

## 2020-07-22 NOTE — ED Notes (Signed)
Pt states that per his dexacom his CBG at 17:34 was 133; states he takes 10 units insulin with breakfast and dinner but 20 units at lunch.

## 2020-07-22 NOTE — ED Notes (Signed)
Pharmacy tech at bedside for med rec

## 2020-07-22 NOTE — ED Provider Notes (Signed)
[facility] Emergency Department Provider Note   ____________________________________________   Event Date/Time   First MD Initiated Contact with Patient 07/22/20 1600     (approximate)  I have reviewed the triage vital signs and the nursing notes.   HISTORY  Chief Complaint leg infection    HPI Mayjor Rashidi [name]. is a 60 y.o. male with past medical history of hypertension, hyperlipidemia, diabetes, CAD, asthma, chronic atrial fibrillation, peripheral arterial disease, and renal transplant in 2012 who presents to the ED with leg swelling.  Patient reports that he was scratched by his dog 5 days ago with a small wound to his left anterior shin.  He tried to clean the wound and apply a dressing, but has had increasing redness, swelling, and warmth despite this.  He reports feeling hot with chills yesterday, but has not taken his temperature.  Leg has become increasingly painful and difficult for him to bear weight on.  He has now noticed a central area of purulence to his left shin.  He has not yet been started on antibiotics for this.        Past Medical History:  Diagnosis Date  . Allergy    seasonal  . Amnestic MCI (mild cognitive impairment with memory loss)   . Asthma   . Benign neoplasm of colon   . CAD (coronary artery disease) 2005   a.  Status post four-vessel CABG in 2005; b. 03/2018 MV: Small, fixed inferoapical and apical septal defect. No ischemia. EF 47% (60-65% by 05/2018 Echo).  . Chronic atrial fibrillation ([place])    a. CHADS2VASc 4 (CHF, HTN, DM, vascular disease)--> Eliquis; b. 09/2018 Zio: Chronic Afib.  . Chronic combined systolic and diastolic CHF (congestive heart failure) ([place])    a.  7/18 Echo: EF 45-50%; b. 05/2018 Echo: EF 60-65%, RVSP 74.8; c. 12/2018 Echo: EF 50-55%. Sev dil LA.  Marland Kitchen Chronic venous stasis dermatitis of both lower extremities   . Cytomegaloviral disease ([place]) 2017  . Depression   . Diabetes mellitus   .  Diverticulosis   . Dyspnea   . Erectile dysfunction   . ESRD (end stage renal disease) ([place]) 2005   a. HD 2004-2012; b. S/p renal transplant in 2012  . GERD (gastroesophageal reflux disease)   . Gout   . Hyperlipidemia    low since renal failure  . Hypertension   . Kidney transplant status, cadaveric 2012  . Obesity   . PAH (pulmonary artery hypertension) ([place])    a. 05/2018 Echo: RVSP 74.85mHg; b. 12/2018 RV not well visualized.  . Pneumonia   . Purpura ([place]   . Sleep apnea    bipap with oxygen 2 l  . Trigger finger   . Ulcer 05/2016   Left shin  . Wears glasses     Patient Active Problem List   Diagnosis Date Noted  . Acute gout of right elbow 05/25/2020  . Erectile dysfunction due to arterial insufficiency 10/20/2019  . Chronic venous embolism and thrombosis of deep veins of upper extremity ([place] 07/23/2019  . Sleep disturbance 07/23/2019  . AV fistula occlusion ([place] 07/12/2019  . Permanent atrial fibrillation ([place]   . Atypical pneumonia 07/01/2019  . Synovitis of finger 06/11/2019  . Pulmonary hypertension due to left heart disease ([place] 01/20/2019  . Trigger finger, unspecified little finger 12/23/2018  . Superficial thrombophlebitis 12/23/2018  . SOB (shortness of breath) 11/06/2018  . CHF (congestive heart failure) ([place] 11/05/2018  . MDD (Carl depressive disorder),  single episode, mild ([place]) 04/29/2018  . Gout attack 03/30/2018  . Cervical sprain, initial encounter 03/02/2018  . Moderate persistent asthma 05/21/2017  . Chronic diastolic heart failure ([place]) 11/19/2016  . Chronic venous insufficiency 06/05/2016  . Proteinuria 06/23/2015  . BPH with obstruction/lower urinary tract symptoms 09/25/2014  . Hypogonadism in male 09/25/2014  . Immunosuppressed status ([place]) 09/09/2014  . Type 2 diabetes mellitus with hyperglycemia, with long-term current use of insulin ([place]) 09/09/2014  . Chronic atrial fibrillation ([place]) 07/18/2014  . DM eye manif type II  10/27/2013  . MDD (recurrent Carl depressive disorder) in remission ([place]) 10/04/2013  . Benign neoplasm of colon 04/22/2013  . Diverticulosis of colon (without mention of hemorrhage) 04/22/2013  . History of renal transplant 03/01/2013  . Asthma, persistent controlled 02/25/2013  . Erectile dysfunction 10/20/2012  . Obstructive sleep apnea 09/29/2012  . Obesity hypoventilation syndrome ([place]) 03/31/2012  . Routine general medical examination at a health care facility 11/12/2011  . Type 2 diabetes, controlled, with renal manifestation ([place]) 03/01/2011  . Essential hypertension 12/17/2010  . Coronary artery disease involving native coronary artery of native heart without angina pectoris 12/17/2010  . ESRD (end stage renal disease) [facility])     Past Surgical History:  Procedure Laterality Date  . BARIATRIC SURGERY    . CARDIAC CATHETERIZATION     [facility]  . CARDIAC CATHETERIZATION  05/03/2019  . CATARACT EXTRACTION W/PHACO Right 10/29/2017   Procedure: CATARACT EXTRACTION PHACO AND INTRAOCULAR LENS PLACEMENT ([place])  RIGHT DIABETIC;  Surgeon: [name], MD;  Location: [place];  Service: Ophthalmology;  Laterality: Right;  Diabetic - insulin  . CATARACT EXTRACTION W/PHACO Left 11/18/2017   Procedure: CATARACT EXTRACTION PHACO AND INTRAOCULAR LENS PLACEMENT ([place]) LEFT IVA/TOPICAL;  Surgeon: [name], MD;  Location: [place];  Service: Ophthalmology;  Laterality: Left;  DIABETES - insulin sleep apnea  . COLONOSCOPY WITH PROPOFOL N/A 04/22/2013   Procedure: COLONOSCOPY WITH PROPOFOL;  Surgeon: [name], MD;  Location: [facility];  Service: Endoscopy;  Laterality: N/A;  . CORONARY ARTERY BYPASS GRAFT  2005   X 4  . DG ANGIO AV SHUNT*L*     right and left upper arms  . FASCIOTOMY  03/03/2012   Procedure: FASCIOTOMY;  Surgeon: [name], MD;  Location: [place];  Service: Orthopedics;  Laterality: Right;  FASCIOTOMY RIGHT SMALL  FINGER  . FASCIOTOMY Left 08/17/2013   Procedure: FASCIOTOMY LEFT RING;  Surgeon: [name], MD;  Location: [place];  Service: Orthopedics;  Laterality: Left;  . INCISION AND DRAINAGE ABSCESS Left 10/15/2015   Procedure: INCISION AND DRAINAGE ABSCESS;  Surgeon: [name], MD;  Location: [facility];  Service: General;  Laterality: Left;  . KIDNEY TRANSPLANT  09/13/2010   cadaver--at [facility]  . RIGHT HEART CATH N/A 11/15/2016   Procedure: RIGHT HEART CATH;  Surgeon: [name], MD;  Location: [place] [facility];  Service: Cardiovascular;  Laterality: N/A;  . RIGHT HEART CATH N/A 05/03/2019   Procedure: RIGHT HEART CATH;  Surgeon: [name], MD;  Location: [place] [facility];  Service: Cardiovascular;  Laterality: N/A;  . TYMPANIC MEMBRANE REPAIR  1/12   left  . VASECTOMY      Prior to Admission medications   Medication Sig Start Date End Date Taking? Authorizing Provider  amoxicillin (AMOXIL) 500 MG capsule TAKE 4 CAPSULES (2,000 MG TOTAL) BY MOUTH 1 HOUR BEFORE DENTAL APPOINTMENT. 02/11/20 02/10/21  Belinda Block, MD  apixaban (ELIQUIS) 5 MG TABS tablet TAKE 1 TABLET BY MOUTH TWICE DAILY 07/04/20 07/04/21  [name], MD  beclomethasone (QVAR) 80 MCG/ACT inhaler Inhale 2 puffs into the lungs 2 (two) times daily.  10/01/13   [provider]  buPROPion (WELLBUTRIN SR) 150 MG 12 hr tablet TAKE 1 TABLET BY MOUTH TWICE DAILY 05/24/20 05/24/21  [name] I, MD  colchicine 0.6 MG tablet Take 0.6 mg by mouth daily as needed. 10/21/19   [provider]  dapagliflozin propanediol (FARXIGA) 10 MG TABS tablet TAKE 1 TABLET (10 MG TOTAL) BY MOUTH DAILY BEFORE BREAKFAST. 03/07/20 03/07/21  [name], MD  diclofenac Sodium (VOLTAREN) 1 % GEL APPLY 2 GRAMS TOPICALLY 3 TIMES DAILY AS NEEDED. 03/07/20 03/07/21  Pleas Koch, NP  febuxostat (ULORIC) 40 MG tablet Take 40 mg by mouth daily. 01/31/20   [provider]  febuxostat  (ULORIC) 40 MG tablet TAKE 1 TABLET (40 MG TOTAL) BY MOUTH ONCE DAILY 01/31/20 01/30/21  Posey Pronto, Mayur K, MD  febuxostat (ULORIC) 40 MG tablet TAKE 1 TABLET BY MOUTH ONCE DAILY 10/22/19 10/21/20  Quintin Alto, MD  Febuxostat 80 MG TABS TAKE 1 TABLET BY MOUTH ONCE DAILY 02/15/20 02/14/21  Quintin Alto, MD  Febuxostat 80 MG TABS Take 1 tablet (80 mg total) by mouth once daily for 180 days 07/05/20     FERREX 150 150 MG capsule Take 150 mg by mouth daily.  09/23/19   [provider]  fluticasone (FLONASE) 50 MCG/ACT nasal spray PLACE 2 SPRAYS INTO BOTH NOSTRILS DAILY. 06/30/20 06/30/21  [name], MD  gabapentin (NEURONTIN) 300 MG capsule Take 300 mg by mouth 2 (two) times daily.  12/01/18 12/01/19  [provider]  gabapentin (NEURONTIN) 300 MG capsule Take 300 mg by mouth 2 (two) times daily. 11/01/19   [provider]  gabapentin (NEURONTIN) 300 MG capsule TAKE 1 CAPSULE BY MOUTH TWICE DAILY 11/01/19 10/31/20  Solum, Betsey Holiday, MD  HUMALOG KWIKPEN 100 UNIT/ML KwikPen Inject 5-10 Units into the skin 3 (three) times daily. Sliding scale 09/01/18   [provider]  insulin glargine (LANTUS) 100 UNIT/ML Solostar Pen INJECT 40 UNITS SUBCUTANEOUSLY NIGHTLY 09/24/19 09/23/20  Solum, Betsey Holiday, MD  insulin glargine (LANTUS) 100 unit/mL SOPN Inject 40 Units into the skin at bedtime.     [provider]  Insulin Glargine-yfgn 100 UNIT/ML SOPN INJECT 40 UNITS SUBCUTANEOUSLY ONCE DAILY 06/19/20 06/19/21  Judi Cong, MD  insulin lispro (HUMALOG) 100 UNIT/ML KwikPen INJECT UP TO 50 UNITS UNDER THE SKIN DAILY AS DIRECTED 04/25/20 04/25/21  Judi Cong, MD  insulin lispro (HUMALOG) 100 UNIT/ML KwikPen INJECT UP TO 50 UNITS UNDER THE SKIN DAILY AS DIRECTED 10/06/19 10/05/20  Solum, Betsey Holiday, MD  Insulin Pen Needle 31G X 5 MM MISC USE AS DIRECTED FOUR TIMES DAILY. 01/19/20 01/18/21  Judi Cong, MD  iron polysaccharides (NIFEREX) 150 MG capsule TAKE 1 CAPSULE BY MOUTH 2 TIMES DAILY. 02/09/20  02/08/21  Belinda Block, MD  lidocaine (LIDODERM) 5 % PLACE 1 PATCH ONTO THE SKIN FOR 12 (TWELVE) HOURS. REMOVE & DISCARD PATCH WITHIN 12 HOURS OR AS DIRECTED BY MD 05/25/20 05/25/21  [name] I, MD  metolazone (ZAROXOLYN) 2.5 MG tablet TAKE 2 TABLETS (5 MG) BY MOUTH ONCE A WEEK. 12/09/19 12/08/20  [name], MD  metolazone (ZAROXOLYN) 5 MG tablet Take 1 tablet (5 mg total) by mouth 2 (two) times a week. On tuesdays Sundays 03/09/20  [name], MD  metolazone (ZAROXOLYN) 5 MG tablet TAKE 1 TABLET (5 MG TOTAL) BY MOUTH 2 (TWO) TIMES A WEEK. EVERY TUESDAY AND FRIDAY 02/15/20 02/14/21  [name], MD  mometasone (NASONEX) 50 MCG/ACT nasal spray Place 2 sprays into the nose daily as needed.    [provider]  montelukast (SINGULAIR) 10 MG tablet TAKE 1 TABLET BY MOUTH AT BEDTIME. 08/18/19 08/17/20  [name], MD  Multiple Vitamin (MULTIVITAMIN WITH MINERALS) TABS tablet Take 1 tablet by mouth daily.    [provider]  mycophenolate (MYFORTIC) 180 MG EC tablet Take 360 mg by mouth 2 (two) times daily.    [provider]  omeprazole (PRILOSEC) 20 MG capsule TAKE 1 CAPSULE BY MOUTH DAILY. 10/05/19 10/04/20  [name], MD  potassium chloride (MICRO-K) 10 MEQ CR capsule Take 7 capsules (70 mEq total) by mouth 2 (two) times daily. Take an extra tab on Tue and Fri with metolazone 06/15/20   [name], MD  potassium chloride (MICRO-K) 10 MEQ CR capsule TAKE 6 CAPSULES (60 MEQ) BY MOUTH 2 TIMES DAILY. TAKE AN EXTRA TABLET ON TUEDAY AND FRIDAY WITH METOLAZONE 06/30/20 06/30/21  [name], MD  predniSONE (DELTASONE) 10 MG tablet TAKE 4 TABLETS BY MOUTH ONCE DAILY FOR 3 DAYS, 3 TABS DAILY FOR 3 DAYS, 2 TABS DAILY FOR 3 DAYS, THEN 1 TAB DAILY FOR 3 DAYS. 05/31/20 05/31/21  Patel, Mayur K, MD  predniSONE (DELTASONE) 10 MG tablet TAKE 4 TABLETS BY MOUTH DAILY FOR 3 DAYS, 3 TABLETS FOR 3 DAYS, 2 TABLETS FOR 3 DAYS, THEN 1 TABLETS FOR 3 DAYS 05/01/20  05/01/21  Patel, Mayur K, MD  predniSONE (DELTASONE) 10 MG tablet TAKE 4 TABLETS BY MOUTH DAILY FOR 3 DAYS, 3 TABLETS FOR 3 DAYS, 2 TABLETS FOR 3 DAYS, THEN 1 TABLET FOR 3 DAYS 03/01/20 03/01/21  Patel, Mayur K, MD  predniSONE (DELTASONE) 10 MG tablet TAKE 4 TABLETS BY MOUTH DAILY FOR 3 DAYS, 3 TABLETS DAILY FOR 3 DAYS, 2 TABLETS FOR 3 DAYS, THEN 1 TABLET FOR 3 DAYS 12/29/19 12/28/20  Patel, Mayur K, MD  predniSONE (DELTASONE) 20 MG tablet TAKE 1 TABLET (20 MG TOTAL) BY MOUTH DAILY. 05/25/20 05/25/21  [name] I, MD  predniSONE (DELTASONE) 20 MG tablet TAKE 1 TABLET BY MOUTH DAILY WITH BREAKFAST FOR 5 DAYS. 05/17/20 05/17/21  Ralene Muskrat B, MD  rosuvastatin (CRESTOR) 10 MG tablet TAKE 1 TABLET BY MOUTH DAILY 06/23/20 06/23/21  Furth, Cadence H, PA-C  sulfamethoxazole-trimethoprim (BACTRIM) 400-80 MG tablet TAKE 1 TABLET BY MOUTH EVERY MONDAY, [place], FRIDAY. 03/06/20 03/06/21  Belinda Block, MD  tacrolimus (PROGRAF) 0.5 MG capsule TAKE 1 CAPSULE (0.5 MG TOTAL) BY MOUTH EVERY EVENING. 05/12/20 05/12/21  Close, Lorne Skeens, NP  tacrolimus (PROGRAF) 1 MG capsule 3 mg in AM and 54m in PM    [provider]  tacrolimus (PROGRAF) 1 MG capsule TAKE 2 TABLETS (2MG) BY MOUTH DAILY IN THE MORNING AND 2 TABLETS AT NIGHT. 10/21/19 10/20/20  Moraitis, LFarley Ly PA-C  tacrolimus (PROGRAF) 1 MG capsule TAKE 3 MG (3 CAPSULES) IN THE MORNING AND 2 MG (2 CAPSULES) AT NIGHT. 08/11/19 08/10/20  JBelinda Block MD  tadalafil, PAH, (ADCIRCA) 20 MG tablet TAKE 1 TABLET (20 MG TOTAL) BY MOUTH EVERY OTHER DAY. 01/28/20 01/27/21  AWellington Hampshire MD  tamsulosin (FLOMAX) 0.4 MG CAPS capsule Take 0.4 mg by mouth daily.     [provider]  tamsulosin (  FLOMAX) 0.4 MG CAPS capsule TAKE 1 CAPSULE BY MOUTH DAILY. 05/23/20 05/23/21  Belinda Block, MD  temazepam (RESTORIL) 15 MG capsule TAKE 1-2 CAPSULES (15-30 MG TOTAL) BY MOUTH AT BEDTIME AS NEEDED FOR SLEEP. 05/25/20 11/21/20  [name] I, MD   torsemide (DEMADEX) 20 MG tablet TAKE 2 TABLETS (40 MG) BY MOUTH DAILY FOR 5 DAYS. CAN RESUME PREVIOUS REGIMEN AFTER THAT DATE OR SOONER IF YOU ARE BUILDING UP FLUID 05/17/20 05/17/21  Sidney Ace, MD  Torsemide 40 MG TABS Take 200 mg by mouth in the morning and at bedtime.    [provider]  vitamin C (ASCORBIC ACID) 250 MG tablet Take 1 tablet (250 mg total) by mouth daily. 07/04/19   Max Sane, MD  zinc sulfate 220 (50 Zn) MG capsule Take 1 capsule (220 mg total) by mouth 2 (two) times daily. 07/04/19   Max Sane, MD    Allergies Contrast media [iodinated diagnostic agents] and Ibuprofen  Family History  Problem Relation Age of Onset  . Heart disease Father   . Kidney failure Father   . Kidney disease Father   . Diabetes Maternal Grandmother   . Breast cancer Maternal Grandmother   . Valvular heart disease Mother   . Liver cancer Paternal Uncle   . Liver cancer Paternal Grandmother   . Prostate cancer Neg Hx     Social History Social History   Tobacco Use  . Smoking status: Former Smoker    Types: Cigars    Quit date: 09/02/1994    Years since quitting: 25.9  . Smokeless tobacco: Never Used  Vaping Use  . Vaping Use: Never used  Substance Use Topics  . Alcohol use: Yes    Alcohol/week: 0.0 - 1.0 standard drinks    Comment: occasional- 3 in the past year  . Drug use: No    Review of Systems  Constitutional: No fever/chills Eyes: No visual changes. ENT: No sore throat. Cardiovascular: Denies chest pain. Respiratory: Denies shortness of breath. Gastrointestinal: No abdominal pain.  No nausea, no vomiting.  No diarrhea.  No constipation. Genitourinary: Negative for dysuria. Musculoskeletal: Negative for back pain.  Positive for left leg pain and swelling. Skin: Positive for rash. Neurological: Negative for headaches, focal weakness or numbness.  ____________________________________________   PHYSICAL EXAM:  VITAL SIGNS: ED Triage Vitals   Enc Vitals Group     BP 07/22/20 1456 (!) 144/76     Pulse Rate 07/22/20 1456 68     Resp 07/22/20 1456 18     Temp 07/22/20 1456 98.3 F (36.8 C)     Temp Source 07/22/20 1456 Oral     SpO2 07/22/20 1456 96 %     Weight 07/22/20 1457 266 lb 12.1 oz (121 kg)     Height 07/22/20 1457 _0  (1.778 m)     Head Circumference --      Peak Flow --      Pain Score 07/22/20 1457 6     Pain Loc --      Pain Edu? --      Excl. in [place]? --     Constitutional: Alert and oriented. Eyes: Conjunctivae are normal. Head: Atraumatic. Nose: No congestion/rhinnorhea. Mouth/Throat: Mucous membranes are moist. Neck: Normal ROM Cardiovascular: Normal rate, regular rhythm. Grossly normal heart sounds.  2+ DP pulses bilaterally. Respiratory: Normal respiratory effort.  No retractions. Lungs CTAB. Gastrointestinal: Soft and nontender. No distention. Genitourinary: deferred Musculoskeletal: Left lower leg edematous and diffusely tender to palpation.  Neurologic:  Normal speech and language. No gross focal neurologic deficits are appreciated. Skin:  Skin is warm, dry and intact.  Erythema, edema, and warmth noted circumferentially to left lower leg with central area of purulent drainage, no focal fluctuance noted. Psychiatric: Mood and affect are normal. Speech and behavior are normal.  ____________________________________________   LABS (all labs ordered are listed, but only abnormal results are displayed)  Labs Reviewed  COMPREHENSIVE METABOLIC PANEL - Abnormal; Notable for the following components:      Result Value   Potassium 3.2 (*)    Glucose, Bld 168 (*)    BUN 50 (*)    Creatinine, Ser 2.11 (*)    GFR, Estimated 35 (*)    All other components within normal limits  CBC WITH DIFFERENTIAL/PLATELET - Abnormal; Notable for the following components:   Hemoglobin 12.3 (*)    MCV 77.8 (*)    MCH 23.3 (*)    RDW 17.7 (*)    Platelets 146 (*)    All other components within normal limits   CULTURE, BLOOD (ROUTINE X 2)  CULTURE, BLOOD (ROUTINE X 2)  SARS CORONAVIRUS 2 (TAT 6-24 HRS)  LACTIC ACID, PLASMA  LACTIC ACID, PLASMA  URINALYSIS, COMPLETE (UACMP) WITH MICROSCOPIC    PROCEDURES  Procedure(s) performed (including Critical Care):  Procedures   ____________________________________________   INITIAL IMPRESSION / ASSESSMENT AND PLAN / ED COURSE       60 year old male with possible history of hypertension, hyperlipidemia, diabetes, CAD, chronic atrial fibrillation, peripheral arterial disease, and renal transplant in 2012 who presents to the ED with increasing redness, pain, swelling, and drainage from left lower extremity wound after being scratched by his dog 5 days ago.  Patient's vital signs are reassuring and he appears overall well, presentation not consistent with sepsis.  However, he is immunocompromised due to his immunosuppressive medications with significant cellulitis to his left lower extremity.  We will start on Rocephin and vancomycin, hydrate with IV fluids and treat pain with IV morphine.  Labs thus far are reassuring with no leukocytosis or lactic acidosis.  Plan to discuss with hospitalist for admission.      ____________________________________________   FINAL CLINICAL IMPRESSION(S) / ED DIAGNOSES  Final diagnoses:  Left leg cellulitis  Renal transplant, status post     ED Discharge Orders    None       Note:  This document was prepared using Dragon voice recognition software and may include unintentional dictation errors.   Blake Divine, MD 07/22/20 [phone]

## 2020-07-22 NOTE — H&P (Signed)
History and Physical    [name]. IZT:[id number] DOB: Nov 23, 1960 DOA: 07/22/2020  PCP: Carl Carbon, MD   Patient coming from: Home  I have personally briefly reviewed patient's old medical records in [place]  Chief Complaint: Left leg pain and swelling  HPI: [name]. is a 60 y.o. male with medical history significant for end-stage renal disease status post renal transplant, coronary artery disease status post CABG, history of A. fib on chronic anticoagulation therapy, chronic venous stasis involving both lower extremities, diabetes mellitus, GERD and sleep apnea who presents to the ER for evaluation of pain, swelling and redness involving his left leg.  Patient states that he was scratched by his dog over a week ago, it was on the anterior left leg.  He kept the wound clean and applied a dressing which he took off 1 day prior to his admission.  When he took the dressing off he pulled a scab and now he has a wound over his anterior left leg with some purulence.  Over the last 24 hours he has noticed increased swelling and redness involving his left leg and he has also had fever and chills at home, even though he did not check his temperature.  He has been unable to bear weight on his left leg due to pain.  He decided to come into the ER for further evaluation. He denies having any chest pain, no shortness of breath, no nausea, no vomiting, no dizziness, no lightheadedness, no headache, abdominal pain, no changes in his bowel habits, no urinary symptoms, no focal deficits, no blurred vision. Labs show sodium 140, potassium 3.2, chloride 100, bicarb 28, glucose 168, BUN 50, creatinine 2.1, calcium 8.9, alkaline phosphatase 86, albumin 3.7, AST 21, ALT 14, total protein 7.5, total bilirubin 0.9, lactic acid 1.2, white count 6.2, hemoglobin 12.3, hematocrit 41.0, MCV 77.8, RDW 17.7, platelet count 146 Patient's SARS coronavirus 2 point-of-care testing is still  pending    ED Course: Patient is a 60 year old male who presents to the ER for evaluation of pain, redness and swelling involving his left leg following a dog scratch.  He received IV Rocephin and vancomycin in the ER and will be admitted to the hospital for further evaluation.  Review of Systems: As per HPI otherwise all other systems reviewed and negative.    Past Medical History:  Diagnosis Date  . Allergy    seasonal  . Amnestic MCI (mild cognitive impairment with memory loss)   . Asthma   . Benign neoplasm of colon   . CAD (coronary artery disease) 2005   a.  Status post four-vessel CABG in 2005; b. 03/2018 MV: Small, fixed inferoapical and apical septal defect. No ischemia. EF 47% (60-65% by 05/2018 Echo).  . Chronic atrial fibrillation ([place])    a. CHADS2VASc 4 (CHF, HTN, DM, vascular disease)--> Eliquis; b. 09/2018 Zio: Chronic Afib.  . Chronic combined systolic and diastolic CHF (congestive heart failure) ([place])    a.  7/18 Echo: EF 45-50%; b. 05/2018 Echo: EF 60-65%, RVSP 74.8; c. 12/2018 Echo: EF 50-55%. Sev dil LA.  Marland Kitchen Chronic venous stasis dermatitis of both lower extremities   . Cytomegaloviral disease ([place]) 2017  . Depression   . Diabetes mellitus   . Diverticulosis   . Dyspnea   . Erectile dysfunction   . ESRD (end stage renal disease) ([place]) 2005   a. HD 2004-2012; b. S/p renal transplant in 2012  . GERD (gastroesophageal reflux disease)   .  Gout   . Hyperlipidemia    low since renal failure  . Hypertension   . Kidney transplant status, cadaveric 2012  . Obesity   . PAH (pulmonary artery hypertension) ([place])    a. 05/2018 Echo: RVSP 74.71mHg; b. 12/2018 RV not well visualized.  . Pneumonia   . Purpura ([place]   . Sleep apnea    bipap with oxygen 2 l  . Trigger finger   . Ulcer 05/2016   Left shin  . Wears glasses     Past Surgical History:  Procedure Laterality Date  . BARIATRIC SURGERY    . CARDIAC CATHETERIZATION     [facility]  . CARDIAC CATHETERIZATION   05/03/2019  . CATARACT EXTRACTION W/PHACO Right 10/29/2017   Procedure: CATARACT EXTRACTION PHACO AND INTRAOCULAR LENS PLACEMENT ([place]  RIGHT DIABETIC;  Surgeon: BLeandrew Koyanagi MD;  [facility] [facility]  Service: Ophthalmology;  Laterality: Right;  Diabetic - insulin  . CATARACT EXTRACTION W/PHACO Left 11/18/2017   Procedure: CATARACT EXTRACTION PHACO AND INTRAOCULAR LENS PLACEMENT ([place] LEFT IVA/TOPICAL;  Surgeon: BLeandrew Koyanagi MD;  [place] [facility]  Service: Ophthalmology;  Laterality: Left;  DIABETES - insulin sleep apnea  . COLONOSCOPY WITH PROPOFOL N/A 04/22/2013   Procedure: COLONOSCOPY WITH PROPOFOL;  Surgeon: DMilus Banister MD;  [facility];  Service: Endoscopy;  Laterality: N/A;  . CORONARY ARTERY BYPASS GRAFT  2005   X 4  . DG ANGIO AV SHUNT*L*     right and left upper arms  . FASCIOTOMY  03/03/2012   Procedure: FASCIOTOMY;  Surgeon: GWynonia Sours MD;  [place]  Service: Orthopedics;  Laterality: Right;  FASCIOTOMY RIGHT SMALL FINGER  . FASCIOTOMY Left 08/17/2013   Procedure: FASCIOTOMY LEFT RING;  Surgeon: GWynonia Sours MD;  [place] [facility]  Service: Orthopedics;  Laterality: Left;  . INCISION AND DRAINAGE ABSCESS Left 10/15/2015   Procedure: INCISION AND DRAINAGE ABSCESS;  Surgeon: DJules Husbands MD;  Location: [facility];  Service: General;  Laterality: Left;  . KIDNEY TRANSPLANT  09/13/2010   cadaver--at [facility]  . RIGHT HEART CATH N/A 11/15/2016   Procedure: RIGHT HEART CATH;  Surgeon: AWellington Hampshire MD;  [facility];  Service: Cardiovascular;  Laterality: N/A;  . RIGHT HEART CATH N/A 05/03/2019   Procedure: RIGHT HEART CATH;  Surgeon: MLarey Dresser MD;  [facility];  Service: Cardiovascular;  Laterality: N/A;  . TYMPANIC MEMBRANE REPAIR  1/12   left  . VASECTOMY       reports that he quit smoking about 25 years ago. His smoking use included  cigars. He has never used smokeless tobacco. He reports current alcohol use. He reports that he does not use drugs.  Allergies  Allergen Reactions  . Contrast Media [Iodinated Diagnostic Agents]     Kidney transplant  . Ibuprofen Other (See Comments)    Due to kidney transplant Due to kidney transplant Due to kidney transplant    Family History  Problem Relation Age of Onset  . Heart disease Father   . Kidney failure Father   . Kidney disease Father   . Diabetes Maternal Grandmother   . Breast cancer Maternal Grandmother   . Valvular heart disease Mother   . Liver cancer Paternal Uncle   . Liver cancer Paternal Grandmother   . Prostate cancer Neg Hx       Prior to Admission medications   Medication Sig Start Date End  Date Taking? Authorizing Provider  amoxicillin (AMOXIL) 500 MG capsule TAKE 4 CAPSULES (2,000 MG TOTAL) BY MOUTH 1 HOUR BEFORE DENTAL APPOINTMENT. 02/11/20 02/10/21  Belinda Block, MD  apixaban (ELIQUIS) 5 MG TABS tablet TAKE 1 TABLET BY MOUTH TWICE DAILY 07/04/20 07/04/21  [name], MD  beclomethasone (QVAR) 80 MCG/ACT inhaler Inhale 2 puffs into the lungs 2 (two) times daily.  10/01/13   [provider]  buPROPion (WELLBUTRIN SR) 150 MG 12 hr tablet TAKE 1 TABLET BY MOUTH TWICE DAILY 05/24/20 05/24/21  [name] I, MD  colchicine 0.6 MG tablet Take 0.6 mg by mouth daily as needed. 10/21/19   [provider]  dapagliflozin propanediol (FARXIGA) 10 MG TABS tablet TAKE 1 TABLET (10 MG TOTAL) BY MOUTH DAILY BEFORE BREAKFAST. 03/07/20 03/07/21  [name], MD  diclofenac Sodium (VOLTAREN) 1 % GEL APPLY 2 GRAMS TOPICALLY 3 TIMES DAILY AS NEEDED. 03/07/20 03/07/21  Pleas Koch, NP  febuxostat (ULORIC) 40 MG tablet Take 40 mg by mouth daily. 01/31/20   [provider]  febuxostat (ULORIC) 40 MG tablet TAKE 1 TABLET (40 MG TOTAL) BY MOUTH ONCE DAILY 01/31/20 01/30/21  Posey Pronto, Mayur K, MD  febuxostat (ULORIC) 40 MG tablet  TAKE 1 TABLET BY MOUTH ONCE DAILY 10/22/19 10/21/20  Quintin Alto, MD  Febuxostat 80 MG TABS TAKE 1 TABLET BY MOUTH ONCE DAILY 02/15/20 02/14/21  Quintin Alto, MD  Febuxostat 80 MG TABS Take 1 tablet (80 mg total) by mouth once daily for 180 days 07/05/20     FERREX 150 150 MG capsule Take 150 mg by mouth daily.  09/23/19   [provider]  fluticasone (FLONASE) 50 MCG/ACT nasal spray PLACE 2 SPRAYS INTO BOTH NOSTRILS DAILY. 06/30/20 06/30/21  Carl Carbon, MD  gabapentin (NEURONTIN) 300 MG capsule Take 300 mg by mouth 2 (two) times daily.  12/01/18 12/01/19  [provider]  gabapentin (NEURONTIN) 300 MG capsule Take 300 mg by mouth 2 (two) times daily. 11/01/19   [provider]  gabapentin (NEURONTIN) 300 MG capsule TAKE 1 CAPSULE BY MOUTH TWICE DAILY 11/01/19 10/31/20  Solum, Betsey Holiday, MD  HUMALOG KWIKPEN 100 UNIT/ML KwikPen Inject 5-10 Units into the skin 3 (three) times daily. Sliding scale 09/01/18   [provider]  insulin glargine (LANTUS) 100 UNIT/ML Solostar Pen INJECT 40 UNITS SUBCUTANEOUSLY NIGHTLY 09/24/19 09/23/20  Solum, Betsey Holiday, MD  insulin glargine (LANTUS) 100 unit/mL SOPN Inject 40 Units into the skin at bedtime.     [provider]  Insulin Glargine-yfgn 100 UNIT/ML SOPN INJECT 40 UNITS SUBCUTANEOUSLY ONCE DAILY 06/19/20 06/19/21  Judi Cong, MD  insulin lispro (HUMALOG) 100 UNIT/ML KwikPen INJECT UP TO 50 UNITS UNDER THE SKIN DAILY AS DIRECTED 04/25/20 04/25/21  Judi Cong, MD  insulin lispro (HUMALOG) 100 UNIT/ML KwikPen INJECT UP TO 50 UNITS UNDER THE SKIN DAILY AS DIRECTED 10/06/19 10/05/20  Solum, Betsey Holiday, MD  Insulin Pen Needle 31G X 5 MM MISC USE AS DIRECTED FOUR TIMES DAILY. 01/19/20 01/18/21  Judi Cong, MD  iron polysaccharides (NIFEREX) 150 MG capsule TAKE 1 CAPSULE BY MOUTH 2 TIMES DAILY. 02/09/20 02/08/21  Belinda Block, MD  lidocaine (LIDODERM) 5 % PLACE 1 PATCH ONTO THE SKIN FOR 12 (TWELVE) HOURS. REMOVE & DISCARD PATCH WITHIN 12  HOURS OR AS DIRECTED BY MD 05/25/20 05/25/21  [name] I, MD  metolazone (ZAROXOLYN) 2.5 MG tablet TAKE 2 TABLETS (5 MG) BY MOUTH ONCE A  WEEK. 12/09/19 12/08/20  [name], MD  metolazone (ZAROXOLYN) 5 MG tablet Take 1 tablet (5 mg total) by mouth 2 (two) times a week. On tuesdays Sundays 03/09/20   [name], MD  metolazone (ZAROXOLYN) 5 MG tablet TAKE 1 TABLET (5 MG TOTAL) BY MOUTH 2 (TWO) TIMES A WEEK. EVERY TUESDAY AND FRIDAY 02/15/20 02/14/21  [name], MD  mometasone (NASONEX) 50 MCG/ACT nasal spray Place 2 sprays into the nose daily as needed.    [provider]  montelukast (SINGULAIR) 10 MG tablet TAKE 1 TABLET BY MOUTH AT BEDTIME. 08/18/19 08/17/20  Carl Carbon, MD  Multiple Vitamin (MULTIVITAMIN WITH MINERALS) TABS tablet Take 1 tablet by mouth daily.    [provider]  mycophenolate (MYFORTIC) 180 MG EC tablet Take 360 mg by mouth 2 (two) times daily.    [provider]  omeprazole (PRILOSEC) 20 MG capsule TAKE 1 CAPSULE BY MOUTH DAILY. 10/05/19 10/04/20  Carl Carbon, MD  potassium chloride (MICRO-K) 10 MEQ CR capsule Take 7 capsules (70 mEq total) by mouth 2 (two) times daily. Take an extra tab on Tue and Fri with metolazone 06/15/20   [name], MD  potassium chloride (MICRO-K) 10 MEQ CR capsule TAKE 6 CAPSULES (60 MEQ) BY MOUTH 2 TIMES DAILY. TAKE AN EXTRA TABLET ON TUEDAY AND FRIDAY WITH METOLAZONE 06/30/20 06/30/21  [name], MD  predniSONE (DELTASONE) 10 MG tablet TAKE 4 TABLETS BY MOUTH ONCE DAILY FOR 3 DAYS, 3 TABS DAILY FOR 3 DAYS, 2 TABS DAILY FOR 3 DAYS, THEN 1 TAB DAILY FOR 3 DAYS. 05/31/20 05/31/21  Patel, Mayur K, MD  predniSONE (DELTASONE) 10 MG tablet TAKE 4 TABLETS BY MOUTH DAILY FOR 3 DAYS, 3 TABLETS FOR 3 DAYS, 2 TABLETS FOR 3 DAYS, THEN 1 TABLETS FOR 3 DAYS 05/01/20 05/01/21  Patel, Mayur K, MD  predniSONE (DELTASONE) 10 MG tablet TAKE 4 TABLETS BY MOUTH DAILY FOR 3 DAYS, 3 TABLETS FOR 3 DAYS, 2 TABLETS FOR 3  DAYS, THEN 1 TABLET FOR 3 DAYS 03/01/20 03/01/21  Patel, Mayur K, MD  predniSONE (DELTASONE) 10 MG tablet TAKE 4 TABLETS BY MOUTH DAILY FOR 3 DAYS, 3 TABLETS DAILY FOR 3 DAYS, 2 TABLETS FOR 3 DAYS, THEN 1 TABLET FOR 3 DAYS 12/29/19 12/28/20  Patel, Mayur K, MD  predniSONE (DELTASONE) 20 MG tablet TAKE 1 TABLET (20 MG TOTAL) BY MOUTH DAILY. 05/25/20 05/25/21  [name] I, MD  predniSONE (DELTASONE) 20 MG tablet TAKE 1 TABLET BY MOUTH DAILY WITH BREAKFAST FOR 5 DAYS. 05/17/20 05/17/21  Ralene Muskrat B, MD  rosuvastatin (CRESTOR) 10 MG tablet TAKE 1 TABLET BY MOUTH DAILY 06/23/20 06/23/21  Furth, Cadence H, PA-C  sulfamethoxazole-trimethoprim (BACTRIM) 400-80 MG tablet TAKE 1 TABLET BY MOUTH EVERY MONDAY, [place], FRIDAY. 03/06/20 03/06/21  Belinda Block, MD  tacrolimus (PROGRAF) 0.5 MG capsule TAKE 1 CAPSULE (0.5 MG TOTAL) BY MOUTH EVERY EVENING. 05/12/20 05/12/21  Close, Lorne Skeens, NP  tacrolimus (PROGRAF) 1 MG capsule 3 mg in AM and 16m in PM    [provider]  tacrolimus (PROGRAF) 1 MG capsule TAKE 2 TABLETS (2MG) BY MOUTH DAILY IN THE MORNING AND 2 TABLETS AT NIGHT. 10/21/19 10/20/20  Moraitis, LFarley Ly PA-C  tacrolimus (PROGRAF) 1 MG capsule TAKE 3 MG (3 CAPSULES) IN THE MORNING AND 2 MG (2 CAPSULES) AT NIGHT. 08/11/19 08/10/20  JBelinda Block MD  tadalafil, PAH, (ADCIRCA) 20 MG tablet TAKE 1 TABLET (20 MG TOTAL) BY  MOUTH EVERY OTHER DAY. 01/28/20 01/27/21  [name], MD  tamsulosin (FLOMAX) 0.4 MG CAPS capsule Take 0.4 mg by mouth daily.     [provider]  tamsulosin (FLOMAX) 0.4 MG CAPS capsule TAKE 1 CAPSULE BY MOUTH DAILY. 05/23/20 05/23/21  Belinda Block, MD  temazepam (RESTORIL) 15 MG capsule TAKE 1-2 CAPSULES (15-30 MG TOTAL) BY MOUTH AT BEDTIME AS NEEDED FOR SLEEP. 05/25/20 11/21/20  [name] I, MD  torsemide (DEMADEX) 20 MG tablet TAKE 2 TABLETS (40 MG) BY MOUTH DAILY FOR 5 DAYS. CAN RESUME PREVIOUS REGIMEN AFTER THAT DATE OR SOONER IF YOU ARE  BUILDING UP FLUID 05/17/20 05/17/21  Sidney Ace, MD  Torsemide 40 MG TABS Take 200 mg by mouth in the morning and at bedtime.    [provider]  vitamin C (ASCORBIC ACID) 250 MG tablet Take 1 tablet (250 mg total) by mouth daily. 07/04/19   Max Sane, MD  zinc sulfate 220 (50 Zn) MG capsule Take 1 capsule (220 mg total) by mouth 2 (two) times daily. 07/04/19   Max Sane, MD    Physical Exam: Vitals:   07/22/20 1457 07/22/20 1601 07/22/20 1602 07/22/20 1603  BP:  (!) 154/78    Pulse:   (!) 55 66  Resp:      Temp:      TempSrc:      SpO2:   93% 98%  Weight: 121 kg     Height: _0  (1.778 m)        Vitals:   07/22/20 1457 07/22/20 1601 07/22/20 1602 07/22/20 1603  BP:  (!) 154/78    Pulse:   (!) 55 66  Resp:      Temp:      TempSrc:      SpO2:   93% 98%  Weight: 121 kg     Height: _1  (1.778 m)         Constitutional: Alert and oriented x 3 . Not in any apparent distress HEENT:      Head: Normocephalic and atraumatic.         Eyes: PERLA, EOMI, Conjunctivae are normal. Sclera is non-icteric.       Mouth/Throat: Mucous membranes are moist.       Neck: Supple with no signs of meningismus. Cardiovascular: Regular rate and rhythm. No murmurs, gallops, or rubs. 2+ symmetrical distal pulses are present . No JVD. No LE edema Respiratory: Respiratory effort normal .Lungs sounds clear bilaterally. No wheezes, crackles, or rhonchi.  Gastrointestinal: Soft, non tender, and non distended with positive bowel sounds.  Central adiposity Genitourinary: No CVA tenderness. Musculoskeletal: Nontender with normal range of motion in all extremities.  Changes related to chronic venous stasis with superimposed redness and differential warmth involving the left leg.  Wound over left anterior tibia Neurologic:  Face is symmetric. Moving all extremities. No gross focal neurologic deficits  Skin: Skin is warm, dry.  Wound over left anterior tibia Psychiatric: Mood and affect  are normal   Labs on Admission: I have personally reviewed following labs and imaging studies  CBC: Recent Labs  Lab 07/17/20 1032 07/22/20 1459  WBC 6.3 6.2  NEUTROABS  --  4.4  HGB 13.3 12.3*  HCT 43.9 41.0  MCV 77.6* 77.8*  PLT 148* 423*   Basic Metabolic Panel: Recent Labs  Lab 07/17/20 1032 07/22/20 1459  NA 141 140  K 4.6 3.2*  CL 97* 100  CO2 36* 28  GLUCOSE 212* 168*  BUN  62* 50*  CREATININE 1.75* 2.11*  CALCIUM 9.4 8.9   GFR: Estimated Creatinine Clearance: 48.6 mL/min (A) (by C-G formula based on SCr of 2.11 mg/dL (H)). Liver Function Tests: Recent Labs  Lab 07/22/20 1459  AST 21  ALT 14  ALKPHOS 86  BILITOT 0.9  PROT 7.5  ALBUMIN 3.7   No results for input(s): LIPASE, AMYLASE in the last 168 hours. No results for input(s): AMMONIA in the last 168 hours. Coagulation Profile: No results for input(s): INR, PROTIME in the last 168 hours. Cardiac Enzymes: No results for input(s): CKTOTAL, CKMB, CKMBINDEX, TROPONINI in the last 168 hours. BNP (last 3 results) No results for input(s): PROBNP in the last 8760 hours. HbA1C: No results for input(s): HGBA1C in the last 72 hours. CBG: No results for input(s): GLUCAP in the last 168 hours. Lipid Profile: No results for input(s): CHOL, HDL, LDLCALC, TRIG, CHOLHDL, LDLDIRECT in the last 72 hours. Thyroid Function Tests: No results for input(s): TSH, T4TOTAL, FREET4, T3FREE, THYROIDAB in the last 72 hours. Anemia Panel: No results for input(s): VITAMINB12, FOLATE, FERRITIN, TIBC, IRON, RETICCTPCT in the last 72 hours. Urine analysis:    Component Value Date/Time   COLORURINE YELLOW (A) 08/04/2019 0814   APPEARANCEUR CLEAR (A) 08/04/2019 0814   APPEARANCEUR Clear 09/23/2014 0855   LABSPEC 1.010 08/04/2019 0814   PHURINE 5.0 08/04/2019 0814   GLUCOSEU 150 (A) 08/04/2019 0814   HGBUR NEGATIVE 08/04/2019 0814   [place] 08/04/2019 0814   BILIRUBINUR Negative 09/23/2014 0855   [place] 08/04/2019 0814   PROTEINUR NEGATIVE 08/04/2019 0814   NITRITE NEGATIVE 08/04/2019 0814   LEUKOCYTESUR NEGATIVE 08/04/2019 0814    Radiological Exams on Admission: No results found.   Assessment/Plan Active Problems:   Cellulitis and abscess of left leg      Left leg cellulitis Following a dog scratch Keep left leg elevated We will place patient empirically on antibiotic therapy with Rocephin and doxycycline for MRSA coverage    Chronic atrial fibrillation Continue apixaban as primary prophylaxis for an acute stroke    Diabetes mellitus with complications of end-stage renal disease status post renal transplant Maintain consistent carbohydrate diet Continue long-acting insulin Sliding scale coverage with Accu-Cheks before meals and at bedtime Continue on a suppressive therapy with Prograf and mycophenolate    Chronic diastolic dysfunction CHF Continue diuretic therapy with metolazone and torsemide    Depression Continue Wellbutrin   Hypokalemia/hypomagnesemia Related to diuretic therapy Supplement potassium and magnesium   BPH Continue Flomax    DVT prophylaxis: Apixaban Code Status: full code Family Communication: Greater than 50% of time was spent discussing patient's condition and plan of care with him at the bedside.   He verbalizes understanding and agrees with the plan. Disposition Plan: Back to previous home environment Consults called: none Status: At the time of admission, it appears that the appropriate admission status for this patient is inpatient. This is judged to be reasonable and necessary in order to provide the required intensity of service to ensure the patient's safety given the presenting symptoms, physical exam findings and initial radiographic and laboratory data in the context of their comorbid conditions. Patient requires inpatient status due to high intensity of service, high risk for further  deterioration and high frequency of surveillance required.    Collier Bullock MD [facility]     07/22/2020, 4:51 PM

## 2020-07-23 DIAGNOSIS — L03116 Cellulitis of left lower limb: Secondary | ICD-10-CM

## 2020-07-23 DIAGNOSIS — L02416 Cutaneous abscess of left lower limb: Secondary | ICD-10-CM | POA: Diagnosis not present

## 2020-07-23 LAB — URINALYSIS, COMPLETE (UACMP) WITH MICROSCOPIC
Bacteria, UA: NONE SEEN
Bilirubin Urine: NEGATIVE
Glucose, UA: >=500 mg/dL — AB
Hgb urine dipstick: NEGATIVE
Ketones, ur: NEGATIVE mg/dL
Leukocytes,Ua: NEGATIVE
Nitrite: NEGATIVE
Protein, ur: NEGATIVE mg/dL
Specific Gravity, Urine: 1.011 (ref 1.005–1.030)
Squamous Epithelial / HPF: NONE SEEN (ref 0–5)
pH: 5 (ref 5.0–8.0)

## 2020-07-23 LAB — GLUCOSE, CAPILLARY
Glucose-Capillary: 139 mg/dL — ABNORMAL HIGH (ref 70–99)
Glucose-Capillary: 181 mg/dL — ABNORMAL HIGH (ref 70–99)
Glucose-Capillary: 178 mg/dL — ABNORMAL HIGH (ref 70–99)
Glucose-Capillary: 228 mg/dL — ABNORMAL HIGH (ref 70–99)

## 2020-07-23 LAB — BASIC METABOLIC PANEL
Anion gap: 7 (ref 5–15)
BUN: 45 mg/dL — ABNORMAL HIGH (ref 6–20)
CO2: 29 mmol/L (ref 22–32)
Calcium: 8.6 mg/dL — ABNORMAL LOW (ref 8.9–10.3)
Chloride: 103 mmol/L (ref 98–111)
Creatinine, Ser: 1.69 mg/dL — ABNORMAL HIGH (ref 0.61–1.24)
GFR, Estimated: 46 mL/min — ABNORMAL LOW (ref >60)
Glucose, Bld: 131 mg/dL — ABNORMAL HIGH (ref 70–99)
Potassium: 3.3 mmol/L — ABNORMAL LOW (ref 3.5–5.1)
Sodium: 139 mmol/L (ref 135–145)

## 2020-07-23 LAB — CBC
HCT: 37.5 % — ABNORMAL LOW (ref 39.0–52.0)
Hemoglobin: 11.1 g/dL — ABNORMAL LOW (ref 13.0–17.0)
MCH: 23.4 pg — ABNORMAL LOW (ref 26.0–34.0)
MCHC: 29.6 g/dL — ABNORMAL LOW (ref 30.0–36.0)
MCV: 78.9 fL — ABNORMAL LOW (ref 80.0–100.0)
Platelets: 124 10*3/uL — ABNORMAL LOW (ref 150–400)
RBC: 4.75 MIL/uL (ref 4.22–5.81)
RDW: 17.5 % — ABNORMAL HIGH (ref 11.5–15.5)
WBC: 4.3 10*3/uL (ref 4.0–10.5)
nRBC: 0 % (ref 0.0–0.2)

## 2020-07-23 LAB — SARS CORONAVIRUS 2 (TAT 6-24 HRS): SARS Coronavirus 2: NEGATIVE

## 2020-07-23 MED ORDER — POTASSIUM CHLORIDE CRYS ER 20 MEQ PO TBCR: 40.0000 meq | EXTENDED_RELEASE_TABLET | Freq: Once | ORAL | Status: DC

## 2020-07-23 MED ORDER — POTASSIUM CHLORIDE 20 MEQ PO PACK
40.0000 meq | PACK | Freq: Once | ORAL | Status: AC
  Administered 2020-07-23: 40 meq via ORAL
  Filled 2020-07-23: qty 2

## 2020-07-23 MED ORDER — VANCOMYCIN HCL 1250 MG/250ML IV SOLN
1250.0000 mg | INTRAVENOUS | Status: DC
  Administered 2020-07-24: 1250 mg via INTRAVENOUS
  Filled 2020-07-23 (×3): qty 250

## 2020-07-23 MED ORDER — SODIUM CHLORIDE 0.9 % IV SOLN
1.0000 g | INTRAVENOUS | Status: DC
  Administered 2020-07-23: 1 g via INTRAVENOUS
  Filled 2020-07-23: qty 1
  Filled 2020-07-23: qty 10

## 2020-07-23 MED ORDER — VANCOMYCIN HCL 2000 MG/400ML IV SOLN
2000.0000 mg | Freq: Once | INTRAVENOUS | Status: AC
  Administered 2020-07-23: 2000 mg via INTRAVENOUS
  Filled 2020-07-23: qty 400

## 2020-07-23 NOTE — Plan of Care (Signed)
  Problem: Clinical Measurements: Goal: Will remain free from infection Outcome: Progressing   Problem: Activity: Goal: Risk for activity intolerance will decrease Outcome: Progressing   Problem: Coping: Goal: Level of anxiety will decrease Outcome: Progressing   Problem: Elimination: Goal: Will not experience complications related to bowel motility Outcome: Progressing   Problem: Pain Managment: Goal: General experience of comfort will improve Outcome: Progressing   Problem: Safety: Goal: Ability to remain free from injury will improve Outcome: Progressing   Problem: Skin Integrity: Goal: Risk for impaired skin integrity will decrease Outcome: Progressing

## 2020-07-23 NOTE — Consult Note (Signed)
Pharmacy Antibiotic Note  [name]. is a 60 y.o. male with medical history significant for ESRD s/p renal transplant, CAD s/p CABG, history of Afib on apixaban, chronic venous stasis to bilateral lower extremities, diabetes, GERD, OSA admitted on 07/22/2020 with left leg cellulitis after reported dog scratch.  Pharmacy has been consulted for vancomycin dosing. Patient is also ordered ceftriaxone.  Scr 2.11 on admission to 1.69 today. Appears c/w baseline. Patient is afebrile with no leukocytosis. Leg ultrasound: "suspected thrombus within a superficial vein in the region of the patient's swelling/redness in the upper calf"  Plan:  Vancomycin 2 g IV x 1 followed by maintenance regimen of vancomycin 1250 mg IV q24h --Calculated AUC: 467, Cmin 11.7 --Levels at steady state as indicated --Daily Scr per protocol  Height: _0  (177.8 cm) Weight: 121 kg (266 lb 12.1 oz) IBW/kg (Calculated) : 73  Temp (24hrs), Avg:98.2 F (36.8 C), Min:97.9 F (36.6 C), Max:98.4 F (36.9 C)  Recent Labs  Lab 07/17/20 1032 07/22/20 1459 07/23/20 0455  WBC 6.3 6.2 4.3  CREATININE 1.75* 2.11* 1.69*  LATICACIDVEN  --  1.2  --     Estimated Creatinine Clearance: 60.6 mL/min (A) (by C-G formula based on SCr of 1.69 mg/dL (H)).    Allergies  Allergen Reactions  . Contrast Media [Iodinated Diagnostic Agents]     Kidney transplant  . Ibuprofen Other (See Comments)    Due to kidney transplant Due to kidney transplant Due to kidney transplant    Antimicrobials this admission: Ceftriaxone 4/30 >>  Vancomycin 4/30 >>   Dose adjustments this admission: N/A  Microbiology results: 4/30 BCx: NGTD  Thank you for allowing pharmacy to be a part of this patient's care.  [name] 07/23/2020 1:01 PM

## 2020-07-23 NOTE — Progress Notes (Signed)
PROGRESS NOTE    [name].  FVO:[id number] DOB: [birth year] DOA: 07/22/2020 PCP: [name], MD  155A/[id number]-AA   Assessment & Plan:   Principal Problem:   Cellulitis and abscess of left leg Active Problems:   History of renal transplant   Chronic atrial fibrillation (HCC)   Chronic venous insufficiency   Chronic diastolic heart failure ([name])   [name]. is a 60 y.o. male with medical history significant forend-stage renal disease status post renal transplant, coronary artery disease status post CABG, history of A. fib on chronic anticoagulation therapy, chronic venous stasis involving both lower extremities, diabetes mellitus, GERD and sleep apnea who presents to the ER for evaluation of pain, swelling and redness involving his left leg.    Left leg cellulitis Following a dog scratch --started on vanc and ceftriaxone on admission, with improvement in pain and swelling Plan: --given skin break, warm and edema around wound, and pt being diabetic, will continue IV abx for 2-3 days before switching to oral for discharge. --cont vanc and ceftriaxone  Chronic atrial fibrillation --no rate control agent on home med list --cont home eliquis  Diabetes mellitus with complications of CKD --cont Lantus 20u nightly --SSI  Hx of ESRD s/p renal transplant --cont Mycophenolate  Chronic diastolic dysfunction CHF Continue diuretic therapy with metolazone and torsemide  Depression Continue Wellbutrin  Hypokalemia/hypomagnesemia Related to diuretic therapy --Monitor and replete  BPH Continue Flomax   DVT prophylaxis: KB:TCYELYH Code Status: Full code  Family Communication:  Level of care: Med-Surg Dispo:   The patient is from: home Anticipated d/c is to: home Anticipated d/c date is: 1-2 days Patient currently is not medically ready to d/c due to: IV abx for cellulitis    Subjective and Interval History:  Pt reported pain and  swelling over left shin much improved.     Objective: Vitals:   07/23/20 0754 07/23/20 1628 07/23/20 1940 07/23/20 2047  BP: (!) 155/82 (!) 120/57 135/68   Pulse: (!) 57 (!) 55 (!) 44 68  Resp: _0 Temp: 98.4 F (36.9 C) 97.6 F (36.4 C) (!) 97.5 F (36.4 C)   TempSrc:   Oral   SpO2: 93% 96% 95% 98%  Weight:      Height:        Intake/Output Summary (Last 24 hours) at 07/24/2020 0146 Last data filed at 07/23/2020 1915 Gross per 24 hour  Intake 1611.73 ml  Output 1675 ml  Net -63.27 ml   Filed Weights   07/22/20 1457  Weight: 121 kg    Examination:   Constitutional: NAD, AAOx3 HEENT: conjunctivae and lids normal, EOMI CV: No cyanosis.   RESP: normal respiratory effort, on RA Extremities: edema in BLE, L>R.  Left shin with a scab and surrounding edema and warmth, no active drainage.   Neuro: II - XII grossly intact.   Psych: Normal mood and affect.  Appropriate judgement and reason   Data Reviewed: I have personally reviewed following labs and imaging studies  CBC: Recent Labs  Lab 07/17/20 1032 07/22/20 1459 07/23/20 0455  WBC 6.3 6.2 4.3  NEUTROABS  --  4.4  --   HGB 13.3 12.3* 11.1*  HCT 43.9 41.0 37.5*  MCV 77.6* 77.8* 78.9*  PLT 148* 146* 909*   Basic Metabolic Panel: Recent Labs  Lab 07/17/20 1032 07/22/20 1459 07/23/20 0455  NA 141 140 139  K 4.6 3.2* 3.3*  CL 97* 100 103  CO2 36* 28 29  GLUCOSE 212* 168* 131*  BUN 62* 50* 45*  CREATININE 1.75* 2.11* 1.69*  CALCIUM 9.4 8.9 8.6*  MG  --  1.9  --    GFR: Estimated Creatinine Clearance: 60.6 mL/min (A) (by C-G formula based on SCr of 1.69 mg/dL (H)). Liver Function Tests: Recent Labs  Lab 07/22/20 1459  AST 21  ALT 14  ALKPHOS 86  BILITOT 0.9  PROT 7.5  ALBUMIN 3.7   No results for input(s): LIPASE, AMYLASE in the last 168 hours. No results for input(s): AMMONIA in the last 168 hours. Coagulation Profile: No results for input(s): INR, PROTIME in the last 168 hours. Cardiac  Enzymes: No results for input(s): CKTOTAL, CKMB, CKMBINDEX, TROPONINI in the last 168 hours. BNP (last 3 results) No results for input(s): PROBNP in the last 8760 hours. HbA1C: Recent Labs    07/22/20 1459  HGBA1C 8.3*   CBG: Recent Labs  Lab 07/22/20 1739 07/23/20 0754 07/23/20 1140 07/23/20 1647 07/23/20 2114  GLUCAP 181* 139* 181* 178* 228*   Lipid Profile: No results for input(s): CHOL, HDL, LDLCALC, TRIG, CHOLHDL, LDLDIRECT in the last 72 hours. Thyroid Function Tests: No results for input(s): TSH, T4TOTAL, FREET4, T3FREE, THYROIDAB in the last 72 hours. Anemia Panel: No results for input(s): VITAMINB12, FOLATE, FERRITIN, TIBC, IRON, RETICCTPCT in the last 72 hours. Sepsis Labs: Recent Labs  Lab 07/22/20 1459  LATICACIDVEN 1.2    Recent Results (from the past 240 hour(s))  Culture, blood (routine x 2)     Status: None (Preliminary result)   Collection Time: 07/22/20  4:37 PM   Specimen: BLOOD  Result Value Ref Range Status   Specimen Description BLOOD BLOOD RIGHT FOREARM  Final   Special Requests   Final    BOTTLES DRAWN AEROBIC AND ANAEROBIC Blood Culture results may not be optimal due to an inadequate volume of blood received in culture bottles   Culture   Final    NO GROWTH < 12 HOURS Performed at [facility], [address], [place], [place] [address]    Report Status PENDING  Incomplete  SARS CORONAVIRUS 2 (TAT 6-24 HRS) Nasopharyngeal Nasopharyngeal Swab     Status: None   Collection Time: 07/22/20  4:38 PM   Specimen: Nasopharyngeal Swab  Result Value Ref Range Status   SARS Coronavirus 2 NEGATIVE NEGATIVE Final    Comment: (NOTE) SARS-CoV-2 target nucleic acids are NOT DETECTED.  The SARS-CoV-2 RNA is generally detectable in upper and lower respiratory specimens during the acute phase of infection. Negative results do not preclude SARS-CoV-2 infection, do not rule out co-infections with other pathogens, and should not be used as  the sole basis for treatment or other patient management decisions. Negative results must be combined with clinical observations, patient history, and epidemiological information. The expected result is Negative.  Fact Sheet for Patients: SugarRoll.be  Fact Sheet for Healthcare Providers: https://www.woods-mathews.com/  This test is not yet approved or cleared by the [place] FDA and  has been authorized for detection and/or diagnosis of SARS-CoV-2 by FDA under an Emergency Use Authorization (EUA). This EUA will remain  in effect (meaning this test can be used) for the duration of the COVID-19 declaration under Se ction 564(b)(1) of the Act, 21 U.S.C. section 360bbb-3(b)(1), unless the authorization is terminated or revoked sooner.  Performed at [facility], [place] [address] [place], [place] [address]   Culture, blood (routine x 2)     Status: None (Preliminary result)   Collection Time:  07/22/20  4:39 PM   Specimen: BLOOD  Result Value Ref Range Status   Specimen Description BLOOD BLOOD RIGHT FOREARM  Final   Special Requests   Final    BOTTLES DRAWN AEROBIC AND ANAEROBIC Blood Culture adequate volume   Culture   Final    NO GROWTH < 12 HOURS Performed at [facility], [address], [place], [place] [address]    Report Status PENDING  Incomplete      Radiology Studies: No results found.   Scheduled Meds: . apixaban  5 mg Oral BID  . vitamin C  250 mg Oral Daily  . budesonide (PULMICORT) nebulizer solution  0.5 mg Nebulization BID  . buPROPion  150 mg Oral BID  . febuxostat  80 mg Oral Daily  . fluticasone  2 spray Each Nare Daily  . gabapentin  200 mg Oral BID  . insulin aspart  0-15 Units Subcutaneous TID WC  . insulin glargine  20 Units Subcutaneous QHS  . iron polysaccharides  150 mg Oral Daily  . metolazone  5 mg Oral Once per day on Mon Thu  . montelukast  10 mg Oral QHS  . multivitamin with  minerals  1 tablet Oral Daily  . mycophenolate  360 mg Oral BID  . pantoprazole  40 mg Oral Daily  . rosuvastatin  10 mg Oral QHS  . sodium chloride flush  3 mL Intravenous Q12H  . tacrolimus  2 mg Oral BID  . tadalafil (PAH)  20 mg Oral QODAY  . tamsulosin  0.4 mg Oral Daily  . torsemide  200 mg Oral BID  . zinc sulfate  220 mg Oral BID   Continuous Infusions: . sodium chloride 250 mL (07/23/20 1249)  . cefTRIAXone (ROCEPHIN)  IV 1 g (07/23/20 1703)  . vancomycin       LOS: 2 days     Enzo Bi, MD [facility] If 7PM-7AM, please contact night-coverage 07/24/2020, 1:46 AM

## 2020-07-24 ENCOUNTER — Other Ambulatory Visit: Payer: Self-pay

## 2020-07-24 LAB — BASIC METABOLIC PANEL
Anion gap: 8 (ref 5–15)
BUN: 53 mg/dL — ABNORMAL HIGH (ref 6–20)
CO2: 31 mmol/L (ref 22–32)
Calcium: 8.7 mg/dL — ABNORMAL LOW (ref 8.9–10.3)
Chloride: 101 mmol/L (ref 98–111)
Creatinine, Ser: 1.84 mg/dL — ABNORMAL HIGH (ref 0.61–1.24)
GFR, Estimated: 41 mL/min — ABNORMAL LOW (ref >60)
Glucose, Bld: 161 mg/dL — ABNORMAL HIGH (ref 70–99)
Potassium: 3.5 mmol/L (ref 3.5–5.1)
Sodium: 140 mmol/L (ref 135–145)

## 2020-07-24 LAB — CBC
HCT: 36.3 % — ABNORMAL LOW (ref 39.0–52.0)
Hemoglobin: 11.1 g/dL — ABNORMAL LOW (ref 13.0–17.0)
MCH: 24.1 pg — ABNORMAL LOW (ref 26.0–34.0)
MCHC: 30.6 g/dL (ref 30.0–36.0)
MCV: 78.7 fL — ABNORMAL LOW (ref 80.0–100.0)
Platelets: 124 10*3/uL — ABNORMAL LOW (ref 150–400)
RBC: 4.61 MIL/uL (ref 4.22–5.81)
RDW: 17.8 % — ABNORMAL HIGH (ref 11.5–15.5)
WBC: 4.1 10*3/uL (ref 4.0–10.5)
nRBC: 0 % (ref 0.0–0.2)

## 2020-07-24 LAB — GLUCOSE, CAPILLARY
Glucose-Capillary: 135 mg/dL — ABNORMAL HIGH (ref 70–99)
Glucose-Capillary: 201 mg/dL — ABNORMAL HIGH (ref 70–99)
Glucose-Capillary: 207 mg/dL — ABNORMAL HIGH (ref 70–99)

## 2020-07-24 LAB — MAGNESIUM: Magnesium: 1.9 mg/dL (ref 1.7–2.4)

## 2020-07-24 MED ORDER — FUROSEMIDE 10 MG/ML IJ SOLN
40.0000 mg | Freq: Once | INTRAMUSCULAR | Status: AC
  Administered 2020-07-24: 40 mg via INTRAVENOUS
  Filled 2020-07-24: qty 4

## 2020-07-24 MED ORDER — AMOXICILLIN-POT CLAVULANATE 875-125 MG PO TABS: 1.0000 | ORAL_TABLET | Freq: Two times a day (BID) | ORAL | Status: DC

## 2020-07-24 MED ORDER — DOXYCYCLINE HYCLATE 100 MG PO TABS: 100.0000 mg | ORAL_TABLET | Freq: Two times a day (BID) | ORAL | Status: DC

## 2020-07-24 MED ORDER — DOXYCYCLINE HYCLATE 100 MG PO TABS
100.0000 mg | ORAL_TABLET | Freq: Two times a day (BID) | ORAL | 0 refills | Status: AC
  Filled 2020-07-24: qty 8, 4d supply, fill #0

## 2020-07-24 MED ORDER — SODIUM CHLORIDE 0.9 % IV SOLN
1.0000 g | INTRAVENOUS | Status: DC
  Administered 2020-07-24: 1 g via INTRAVENOUS
  Filled 2020-07-24: qty 10

## 2020-07-24 MED ORDER — AMOXICILLIN-POT CLAVULANATE 875-125 MG PO TABS
1.0000 | ORAL_TABLET | Freq: Two times a day (BID) | ORAL | 0 refills | Status: AC
  Filled 2020-07-24: qty 8, 4d supply, fill #0

## 2020-07-24 MED ORDER — TORSEMIDE 20 MG PO TABS: 100.0000 mg | ORAL_TABLET | Freq: Two times a day (BID) | ORAL | Status: DC

## 2020-07-24 MED FILL — Metolazone Tab 5 MG: ORAL | 35 days supply | Qty: 10 | Fill #0 | Status: CN

## 2020-07-24 NOTE — Consult Note (Signed)
Pharmacy Antibiotic Note  [name]. is a 60 y.o. male with medical history significant for ESRD s/p renal transplant, CAD s/p CABG, history of Afib on apixaban, chronic venous stasis to bilateral lower extremities, diabetes, GERD, OSA admitted on 07/22/2020 with left leg cellulitis after reported dog scratch.  Pharmacy has been consulted for vancomycin dosing. Patient is also ordered ceftriaxone.  5/2: MD to evaluate wound, with possibility of change to oral antibiotics  Plan: Scr 1.69 > 1.84 Will continue vancomycin 1250 mg IV q24h for now --Calculated AUC: 504, Cmin 13.1  Scr 1.84 --Levels at steady state as indicated --Watch renal fxn with hx renal transplant. Torsemide discontinued by provider. Daily Scr per protocol    Height: _0  (177.8 cm) Weight: 121 kg (266 lb 12.1 oz) IBW/kg (Calculated) : 73  Temp (24hrs), Avg:97.6 F (36.4 C), Min:97.5 F (36.4 C), Max:97.8 F (36.6 C)  Recent Labs  Lab 07/22/20 1459 07/23/20 0455 07/24/20 0526  WBC 6.2 4.3 4.1  CREATININE 2.11* 1.69* 1.84*  LATICACIDVEN 1.2  --   --     Estimated Creatinine Clearance: 55.7 mL/min (A) (by C-G formula based on SCr of 1.84 mg/dL (H)).    Allergies  Allergen Reactions  . Contrast Media [Iodinated Diagnostic Agents]     Kidney transplant  . Ibuprofen Other (See Comments)    Due to kidney transplant Due to kidney transplant Due to kidney transplant    Antimicrobials this admission: Ceftriaxone 4/30 >>  Vancomycin 4/30 >>   Dose adjustments this admission: N/A  Microbiology results: 4/30 BCx: NGTD  Thank you for allowing pharmacy to be a part of this patient's care.  Sabas Frett A 07/24/2020 1:15 PM

## 2020-07-24 NOTE — Progress Notes (Signed)
Pt provided discharge instructions and discharged by wheelchair.    07/24/20 1800  Vitals  Temp (!) 97.5 F (36.4 C)  Temp Source Oral  BP (!) 158/88  BP Location Right Arm  Pulse Rate (!) 52  Pulse Rate Source Monitor  MEWS COLOR  MEWS Score Color Green  Oxygen Therapy  SpO2 98 %  O2 Device Room [employer]

## 2020-07-24 NOTE — Progress Notes (Signed)
Pt has refused pulmicort nebulizer 4 times. The nebulizer will be d/c.

## 2020-07-24 NOTE — Discharge Summary (Addendum)
Physician Discharge Summary   [name].  male DOB: Jul 17, 1960  LPF:[id number]  PCP: [name], MD  Admit date: 07/22/2020 Discharge date: 07/24/2020  Admitted From: home Disposition:  Home Wife updated at bedside prior to discharge.  CODE STATUS: Full code  Discharge Instructions    Discharge instructions   Complete by: As directed    You have finished 3 days of IV antibiotics for your cellulitis.  Please finish 4 more days with oral Augmentin and doxycycline as directed at home.  Your medication list is very complicated and with a lot of duplications.  I have made no changes, so please return to take medications as you have been.  Since your nephrologist had prescribed your diuretics, I will leave them unchanged.     Dr. Enzo Bi - -   No wound care   Complete by: As directed        Hospital Course:  For full details, please see H&P, progress notes, consult notes and ancillary notes.  Briefly,  Carl Beckis a 60 y.o.malewith medical history significant forend-stage renal disease status post renal transplant, coronary artery disease status post CABG, history of A. fib on chronic anticoagulation therapy, chronic venous stasis involving both lower extremities, diabetes mellitus, GERD and sleep apnea who presented to the ER for evaluation of pain, swelling and redness involving his left leg.    Left legcellulitis Following a dog scratch --started on vanc and ceftriaxone on admission, with improvement in pain and swelling.  Pt received 3 days of IV abx, and really wanted to go home, so was discharged on 4 more days of Augmentin and doxycycline.    Chronic atrial fibrillation --no rate control agent on home med list --cont home eliquis  Diabetes mellitus, poorly controlled With complications of CKD --Z3G 8.3. Prescribed Lantus 20u nightly and SSI while inpatient.  Pt was discharged on home insulin regimen.  Hx of ESRD s/p  renal transplant CKD 3a of transplant kidney --cont Mycophenolate and Prograf.  Pt is not on prednisone for his transplant.  Chronic diastolic dysfunction CHF Appear euvolemic with no respiratory symptoms.   --cont home metolazone and torsemide which were prescribed by his nephrologist.    Chronic venous stasis Pt reported taking diuretic for his leg swelling.  I did caution him about taking diuretic with his transplanted kidney and elevated Cr. --cont home metolazone and torsemide which were prescribed by his nephrologist.    Depression Continue Wellbutrin  Hypokalemia/hypomagnesemia Related to diuretic therapy --Monitor and replete  BPH Continue Flomax  Gout Pt reported having a recent flare.  Continued Febuxostat and prednisone taper.   Discharge Diagnoses:  Principal Problem:   Cellulitis and abscess of left leg Active Problems:   History of renal transplant   Chronic atrial fibrillation (HCC)   Chronic venous insufficiency   Chronic diastolic heart failure (HCC)   30 Day Unplanned Readmission Risk Score   Flowsheet Row ED to Hosp-Admission (Current) from 07/22/2020 in [facility] (1A)  30 Day Unplanned Readmission Risk Score (%) 38.15 Filed at 07/24/2020 1600     This score is the patient's risk of an unplanned readmission within 30 days of being discharged (0 -100%). The score is based on dignosis, age, lab data, medications, orders, and past utilization.   Low:  0-14.9   Medium: 15-21.9   High: 22-29.9   Extreme: 30 and above        Discharge Instructions:  Allergies as of  07/24/2020      Reactions   Contrast Media [iodinated Diagnostic Agents]    Kidney transplant   Ibuprofen Other (See Comments)   Due to kidney transplant Due to kidney transplant Due to kidney transplant      Medication List    STOP taking these medications   amoxicillin 500 MG capsule Commonly known as: AMOXIL   diclofenac Sodium 1 %  Gel Commonly known as: VOLTAREN   lidocaine 5 % Commonly known as: LIDODERM   mometasone 50 MCG/ACT nasal spray Commonly known as: NASONEX   Semglee (yfgn) 100 UNIT/ML Sopn Generic drug: Insulin Glargine-yfgn   temazepam 15 MG capsule Commonly known as: RESTORIL     TAKE these medications   amoxicillin-clavulanate 875-125 MG tablet Commonly known as: AUGMENTIN Take 1 tablet by mouth 2 (two) times daily for 4 days. Antibiotic. Start taking on: Jul 25, 2020   beclomethasone 80 MCG/ACT inhaler Commonly known as: QVAR Inhale 2 puffs into the lungs 2 (two) times daily.   buPROPion 150 MG 12 hr tablet Commonly known as: WELLBUTRIN SR TAKE 1 TABLET BY MOUTH TWICE DAILY What changed: how much to take   cholecalciferol 25 MCG (1000 UNIT) tablet Commonly known as: VITAMIN D3 Take 1,000 Units by mouth daily.   colchicine 0.6 MG tablet Take 0.6 mg by mouth daily as needed.   doxycycline 100 MG tablet Commonly known as: VIBRA-TABS Take 1 tablet (100 mg total) by mouth every 12 (twelve) hours for 4 days. Antibiotic. Start taking on: Jul 25, 2020   Eliquis 5 MG Tabs tablet Generic drug: apixaban TAKE 1 TABLET BY MOUTH TWICE DAILY What changed: how much to take   Farxiga 10 MG Tabs tablet Generic drug: dapagliflozin propanediol TAKE 1 TABLET (10 MG TOTAL) BY MOUTH DAILY BEFORE BREAKFAST. What changed: how much to take   Febuxostat 80 MG Tabs Take 1 tablet (80 mg total) by mouth once daily for 180 days What changed: Another medication with the same name was removed. Continue taking this medication, and follow the directions you see here.   Ferrex 150 150 MG capsule Generic drug: iron polysaccharides Take 150 mg by mouth daily. What changed: Another medication with the same name was removed. Continue taking this medication, and follow the directions you see here.   fluticasone 50 MCG/ACT nasal spray Commonly known as: FLONASE PLACE 2 SPRAYS INTO BOTH NOSTRILS DAILY. What  changed: when to take this   gabapentin 300 MG capsule Commonly known as: NEURONTIN TAKE 1 CAPSULE BY MOUTH TWICE DAILY What changed:   how much to take  Another medication with the same name was removed. Continue taking this medication, and follow the directions you see here.   HumaLOG KwikPen 100 UNIT/ML KwikPen Generic drug: insulin lispro Inject 5-10 Units into the skin 3 (three) times daily. Sliding scale What changed: Another medication with the same name was removed. Continue taking this medication, and follow the directions you see here.   Lantus SoloStar 100 UNIT/ML Solostar Pen Generic drug: insulin glargine INJECT 40 UNITS SUBCUTANEOUSLY NIGHTLY What changed:   how much to take  how to take this  when to take this   metolazone 2.5 MG tablet Commonly known as: ZAROXOLYN TAKE 2 TABLETS (5 MG) BY MOUTH ONCE A WEEK. What changed:   how much to take  how to take this  when to take this  additional instructions  Another medication with the same name was removed. Continue taking this medication, and follow the directions you  see here.   montelukast 10 MG tablet Commonly known as: SINGULAIR TAKE 1 TABLET BY MOUTH AT BEDTIME. What changed: how much to take   multivitamin with minerals Tabs tablet Take 1 tablet by mouth daily.   mycophenolate 180 MG EC tablet Commonly known as: MYFORTIC Take 360 mg by mouth 2 (two) times daily.   omeprazole 20 MG capsule Commonly known as: PRILOSEC TAKE 1 CAPSULE BY MOUTH DAILY. What changed: how much to take   potassium chloride 10 MEQ CR capsule Commonly known as: MICRO-K Take 7 capsules (70 mEq total) by mouth 2 (two) times daily. Take an extra tab on Tue and Fri with metolazone What changed: Another medication with the same name was removed. Continue taking this medication, and follow the directions you see here.   predniSONE 5 MG tablet Commonly known as: DELTASONE Take 5 mg by mouth daily with breakfast. What  changed: Another medication with the same name was removed. Continue taking this medication, and follow the directions you see here.   rosuvastatin 10 MG tablet Commonly known as: CRESTOR TAKE 1 TABLET BY MOUTH DAILY What changed: how much to take   sulfamethoxazole-trimethoprim 400-80 MG tablet Commonly known as: BACTRIM TAKE 1 TABLET BY MOUTH EVERY MONDAY, WEDNESDAY, FRIDAY. What changed:   how much to take  how to take this  when to take this  additional instructions   tacrolimus 0.5 MG capsule Commonly known as: PROGRAF TAKE 1 CAPSULE (0.5 MG TOTAL) BY MOUTH EVERY EVENING. What changed:   how much to take  how to take this  when to take this  additional instructions  Another medication with the same name was removed. Continue taking this medication, and follow the directions you see here.   tadalafil (PAH) 20 MG tablet Commonly known as: ADCIRCA TAKE 1 TABLET (20 MG TOTAL) BY MOUTH EVERY OTHER DAY. What changed:   how much to take  how to take this  when to take this   tamsulosin 0.4 MG Caps capsule Commonly known as: FLOMAX TAKE 1 CAPSULE BY MOUTH DAILY. What changed: how much to take   torsemide 20 MG tablet Commonly known as: DEMADEX Take 5 tablets (100 mg total) by mouth 2 (two) times daily. What changed:   how much to take  how to take this  when to take this  Another medication with the same name was removed. Continue taking this medication, and follow the directions you see here.   Ultra-Thin II Mini Pen Needle 31G X 5 MM Misc Generic drug: Insulin Pen Needle USE AS DIRECTED FOUR TIMES DAILY.   vitamin C 250 MG tablet Commonly known as: ASCORBIC ACID Take 1 tablet (250 mg total) by mouth daily.   zinc sulfate 220 (50 Zn) MG capsule Take 1 capsule (220 mg total) by mouth 2 (two) times daily.        Follow-up Information    [name], MD. Schedule an appointment as soon as possible for a visit in 1 week(s).    Specialties: Internal Medicine, Pediatrics Contact information: [place] [address] [phone]        [name], MD .   Specialty: Cardiology Contact information: [place] [address] [phone]        [name], MD .   Specialty: Cardiology Contact information: [address] [place]  [address] [phone]  Allergies  Allergen Reactions  . Contrast Media [Iodinated Diagnostic Agents]     Kidney transplant  . Ibuprofen Other (See Comments)    Due to kidney transplant Due to kidney transplant Due to kidney transplant     The results of significant diagnostics from this hospitalization (including imaging, microbiology, ancillary and laboratory) are listed below for reference.   Consultations:   Procedures/Studies: US Venous Img Lower Unilateral Right  Result Date: 07/17/2020 CLINICAL DATA:  Swelling/redness. EXAM: RIGHT LOWER EXTREMITY VENOUS DOPPLER ULTRASOUND TECHNIQUE: Gray-scale sonography with compression, as well as color and duplex ultrasound, were performed to evaluate the deep venous system(s) from the level of the common femoral vein through the popliteal and proximal calf veins. COMPARISON:  May 12, 2020. FINDINGS: VENOUS Normal compressibility of the common femoral, superficial femoral, and popliteal veins, as well as the visualized calf veins. Visualized portions of profunda femoral vein and great saphenous vein unremarkable. No filling defects to suggest DVT on grayscale or color Doppler imaging. Doppler waveforms show normal direction of venous flow, normal respiratory plasticity and response to augmentation. In the region of the patient's redness/swelling in the upper calf there is a superficial vein with apparent internal filling defect and absence of flow on Doppler imaging, suggestive of thrombus. Limited compressibility Limited views of the  contralateral common femoral vein are unremarkable. IMPRESSION: 1. No evidence of DVT. 2. Suspected thrombus within a superficial vein in the region of the patient's swelling/redness in the upper calf. Electronically Signed   By: Margaretha Sheffield MD   On: 07/17/2020 09:46      Labs: BNP (last 3 results) No results for input(s): BNP in the last 8760 hours. Basic Metabolic Panel: Recent Labs  Lab 07/22/20 1459 07/23/20 0455 07/24/20 0526  NA 140 139 140  K 3.2* 3.3* 3.5  CL 100 103 101  CO2 _0 GLUCOSE 168* 131* 161*  BUN 50* 45* 53*  CREATININE 2.11* 1.69* 1.84*  CALCIUM 8.9 8.6* 8.7*  MG 1.9  --  1.9   Liver Function Tests: Recent Labs  Lab 07/22/20 1459  AST 21  ALT 14  ALKPHOS 86  BILITOT 0.9  PROT 7.5  ALBUMIN 3.7   No results for input(s): LIPASE, AMYLASE in the last 168 hours. No results for input(s): AMMONIA in the last 168 hours. CBC: Recent Labs  Lab 07/22/20 1459 07/23/20 0455 07/24/20 0526  WBC 6.2 4.3 4.1  NEUTROABS 4.4  --   --   HGB 12.3* 11.1* 11.1*  HCT 41.0 37.5* 36.3*  MCV 77.8* 78.9* 78.7*  PLT 146* 124* 124*   Cardiac Enzymes: No results for input(s): CKTOTAL, CKMB, CKMBINDEX, TROPONINI in the last 168 hours. BNP: Invalid input(s): POCBNP CBG: Recent Labs  Lab 07/23/20 1140 07/23/20 1647 07/23/20 2114 07/24/20 0744 07/24/20 1215  GLUCAP 181* 178* 228* 135* 201*   D-Dimer No results for input(s): DDIMER in the last 72 hours. Hgb A1c Recent Labs    07/22/20 1459  HGBA1C 8.3*   Lipid Profile No results for input(s): CHOL, HDL, LDLCALC, TRIG, CHOLHDL, LDLDIRECT in the last 72 hours. Thyroid function studies No results for input(s): TSH, T4TOTAL, T3FREE, THYROIDAB in the last 72 hours.  Invalid input(s): FREET3 Anemia work up No results for input(s): VITAMINB12, FOLATE, FERRITIN, TIBC, IRON, RETICCTPCT in the last 72 hours. Urinalysis    Component Value Date/Time   COLORURINE YELLOW (A) 07/22/2020 1559    APPEARANCEUR CLEAR (A) 07/22/2020 1559   APPEARANCEUR Clear 09/23/2014 0855  LABSPEC 1.011 07/22/2020 1559   PHURINE 5.0 07/22/2020 1559   GLUCOSEU >=500 (A) 07/22/2020 1559   HGBUR NEGATIVE 07/22/2020 1559   BILIRUBINUR NEGATIVE 07/22/2020 1559   BILIRUBINUR Negative 09/23/2014 0855   KETONESUR NEGATIVE 07/22/2020 1559   PROTEINUR NEGATIVE 07/22/2020 1559   NITRITE NEGATIVE 07/22/2020 1559   LEUKOCYTESUR NEGATIVE 07/22/2020 1559   Sepsis Labs Invalid input(s): PROCALCITONIN,  WBC,  LACTICIDVEN Microbiology Recent Results (from the past 240 hour(s))  Culture, blood (routine x 2)     Status: None (Preliminary result)   Collection Time: 07/22/20  4:37 PM   Specimen: BLOOD  Result Value Ref Range Status   Specimen Description BLOOD BLOOD RIGHT FOREARM  Final   Special Requests   Final    BOTTLES DRAWN AEROBIC AND ANAEROBIC Blood Culture results may not be optimal due to an inadequate volume of blood received in culture bottles   Culture   Final    NO GROWTH 2 DAYS Performed at [facility], [address], [place], [place] [address]    Report Status PENDING  Incomplete  SARS CORONAVIRUS 2 (TAT 6-24 HRS) Nasopharyngeal Nasopharyngeal Swab     Status: None   Collection Time: 07/22/20  4:38 PM   Specimen: Nasopharyngeal Swab  Result Value Ref Range Status   SARS Coronavirus 2 NEGATIVE NEGATIVE Final    Comment: (NOTE) SARS-CoV-2 target nucleic acids are NOT DETECTED.  The SARS-CoV-2 RNA is generally detectable in upper and lower respiratory specimens during the acute phase of infection. Negative results do not preclude SARS-CoV-2 infection, do not rule out co-infections with other pathogens, and should not be used as the sole basis for treatment or other patient management decisions. Negative results must be combined with clinical observations, patient history, and epidemiological information. The expected result is Negative.  Fact Sheet for  Patients: SugarRoll.be  Fact Sheet for Healthcare Providers: https://www.woods-mathews.com/  This test is not yet approved or cleared by the [place] FDA and  has been authorized for detection and/or diagnosis of SARS-CoV-2 by FDA under an Emergency Use Authorization (EUA). This EUA will remain  in effect (meaning this test can be used) for the duration of the COVID-19 declaration under Se ction 564(b)(1) of the Act, 21 U.S.C. section 360bbb-3(b)(1), unless the authorization is terminated or revoked sooner.  Performed at [facility], [place] [address], [place], [place] [address]   Culture, blood (routine x 2)     Status: None (Preliminary result)   Collection Time: 07/22/20  4:39 PM   Specimen: BLOOD  Result Value Ref Range Status   Specimen Description BLOOD BLOOD RIGHT FOREARM  Final   Special Requests   Final    BOTTLES DRAWN AEROBIC AND ANAEROBIC Blood Culture adequate volume   Culture   Final    NO GROWTH 2 DAYS Performed at [name], [address] [place], [place] [address]    Report Status PENDING  Incomplete     Total time spend on discharging this patient, including the last patient exam, discussing the hospital stay, instructions for ongoing care as it relates to all pertinent caregivers, as well as preparing the medical discharge records, prescriptions, and/or referrals as applicable, is 45 minutes.    Enzo Bi, MD  [facility] 07/24/2020, 5:16 PM

## 2020-07-25 ENCOUNTER — Telehealth: Payer: Self-pay

## 2020-07-25 ENCOUNTER — Other Ambulatory Visit: Payer: Self-pay

## 2020-07-25 DIAGNOSIS — G4733 Obstructive sleep apnea (adult) (pediatric): Secondary | ICD-10-CM | POA: Diagnosis not present

## 2020-07-25 DIAGNOSIS — E662 Morbid (severe) obesity with alveolar hypoventilation: Secondary | ICD-10-CM | POA: Diagnosis not present

## 2020-07-25 NOTE — Telephone Encounter (Signed)
Transition Care Management Follow-up Telephone Call  Date of discharge and from where: 07/24/2020 [facility]  How have you been since you were released from the hospital? Doing ok. A little slow  Any questions or concerns? No  Items Reviewed:  Did the pt receive and understand the discharge instructions provided? Yes   Medications obtained and verified? Yes   Other? No   Any new allergies since your discharge? No   Dietary orders reviewed? Yes  Do you have support at home? Yes   Home Care and Equipment/Supplies: Were home health services ordered? not applicable If so, what is the name of the agency? n/a  Has the agency set up a time to come to the patient's home? not applicable Were any new equipment or medical supplies ordered?  No What is the name of the medical supply agency? n/a Were you able to get the supplies/equipment? not applicable Do you have any questions related to the use of the equipment or supplies? No  Functional Questionnaire: (I = Independent and D = Dependent) ADLs: I  Bathing/Dressing- I  Meal Prep- I  Eating- I  Maintaining continence- I  Transferring/Ambulation- I  Managing Meds- I  Follow up appointments reviewed:   PCP Hospital f/u appt confirmed? Yes  Scheduled to see Dr. Silvio Pate on 07/28/2020 @ 12:00.  Are transportation arrangements needed? No   If their condition worsens, is the pt aware to call PCP or go to the Emergency Dept.? Yes  Was the patient provided with contact information for the PCP's office or ED? Yes  Was to pt encouraged to call back with questions or concerns? Yes

## 2020-07-26 ENCOUNTER — Other Ambulatory Visit: Payer: Self-pay

## 2020-07-26 ENCOUNTER — Other Ambulatory Visit: Payer: Self-pay | Admitting: *Deleted

## 2020-07-26 ENCOUNTER — Telehealth (HOSPITAL_COMMUNITY): Payer: Self-pay

## 2020-07-26 NOTE — Telephone Encounter (Signed)
Spoke to [name] and checked to see how his leg was healing.  He states looks and feels a lot better.  He states just mowed his yard, riding not pushing.  He states taking it easy, will not weed eat or push mow.  Taking a break from gym this week to let it heal.  He states weight is good, taking all his medications and feels good from HF stand point.  He states has all his medications and aware of how to take them.  He watches his diet and how much fluids he intakes.  Will make a home visit next week.  Will visit for heart failure, diet and medication compliance.   [place] [phone]

## 2020-07-26 NOTE — Patient Outreach (Signed)
[facility]) Care Management  07/26/2020  [name] 1960/09/09 [id number]   Transition of care telephone call  Referral received:07/23/20 Initial outreach:07/26/20 Insurance: [facility]   Initial unsuccessful telephone call to patient's preferred number in order to complete transition of care assessment; no answer, left HIPAA compliant voicemail message requesting return call.   Objective: Per the electronic medical record, Mr.[name]   was hospitalized at [facility] 4/30-07/24/20  With/for Left Leg Cellulitis  . Comorbidities include: ESRD, s/p renal transplant, CAD s/p CABG, atrial fib, Chronic venous stasis involving lower extremity, Chronic Diastolic heart failure He was discharged to home on 07/24/20  without the need for home health services or durable medical equipment per the discharge summary.   Plan: This RNCM will route unsuccessful outreach letter with [facility] pamphlet and 24 hour Nurse Advice Line Magnet to [facility] clinical pool to be mailed to patient's home address. This RNCM will attempt another outreach within 4 business days.   Joylene Draft, RN, BSN  [facility] Management Coordinator  [phone]- Mobile [phone]- Toll Free Main Office

## 2020-07-27 LAB — CULTURE, BLOOD (ROUTINE X 2)
Culture: NO GROWTH
Culture: NO GROWTH
Special Requests: ADEQUATE

## 2020-07-28 ENCOUNTER — Ambulatory Visit (INDEPENDENT_AMBULATORY_CARE_PROVIDER_SITE_OTHER): Payer: 59 | Admitting: Internal Medicine

## 2020-07-28 ENCOUNTER — Other Ambulatory Visit: Payer: Self-pay

## 2020-07-28 ENCOUNTER — Encounter: Payer: Self-pay | Admitting: Internal Medicine

## 2020-07-28 VITALS — BP 126/80 | HR 61 | Temp 97.8°F | Ht 68.0 in | Wt 274.0 lb

## 2020-07-28 DIAGNOSIS — L03116 Cellulitis of left lower limb: Secondary | ICD-10-CM

## 2020-07-28 DIAGNOSIS — L02416 Cutaneous abscess of left lower limb: Secondary | ICD-10-CM

## 2020-07-28 DIAGNOSIS — E1165 Type 2 diabetes mellitus with hyperglycemia: Secondary | ICD-10-CM | POA: Diagnosis not present

## 2020-07-28 DIAGNOSIS — I2510 Atherosclerotic heart disease of native coronary artery without angina pectoris: Secondary | ICD-10-CM

## 2020-07-28 NOTE — Assessment & Plan Note (Addendum)
Seems to be resolved on the antibiotics He will finish the augmentin and doxy Mild diarrhea but not limiting for the augmentin Discussed protection for his calves (like wraps or support socks)--to prevent other damage given the extent of the stasis changes

## 2020-07-28 NOTE — Progress Notes (Signed)
Subjective:    Patient ID: Carl Leopold Smyers., male    DOB: July 16, 1960, 60 y.o.   MRN: [id number]  HPI Here for hospital follow up This visit occurred during the SARS-CoV-2 public health emergency.  Safety protocols were in place, including screening questions prior to the visit, additional usage of staff PPE, and extensive cleaning of exam room while observing appropriate contact time as indicated for disinfecting solutions.   Reviewed hospital records and discharge summary  Scratched in left leg by son's dog Son, DIL , grandchild are now living with them (rent was raised to $1400 for a one bedroom) He washed it off well and bandaged it Still able to wear his compression socks Increasing redness--so went to ER after 4 days--and had chills (?fever)  Admitted --started on vanco and ceftriaxone He noted improvement by the next day Got 3 days of parenteral antibiotics Now home on augmentin and doxy Leg is better  Current Outpatient Medications on File Prior to Visit  Medication Sig Dispense Refill  . amoxicillin-clavulanate (AUGMENTIN) 875-125 MG tablet Take 1 tablet by mouth 2 (two) times daily for 4 days. Antibiotic. 8 tablet 0  . apixaban (ELIQUIS) 5 MG TABS tablet TAKE 1 TABLET BY MOUTH TWICE DAILY (Patient taking differently: Take 5 mg by mouth 2 (two) times daily.) 180 tablet 1  . beclomethasone (QVAR) 80 MCG/ACT inhaler Inhale 2 puffs into the lungs 2 (two) times daily.     Marland Kitchen buPROPion (WELLBUTRIN SR) 150 MG 12 hr tablet TAKE 1 TABLET BY MOUTH TWICE DAILY (Patient taking differently: Take 150 mg by mouth 2 (two) times daily.) 180 tablet 3  . cholecalciferol (VITAMIN D3) 25 MCG (1000 UNIT) tablet Take 1,000 Units by mouth daily.    . colchicine 0.6 MG tablet Take 0.6 mg by mouth daily as needed.    . dapagliflozin propanediol (FARXIGA) 10 MG TABS tablet TAKE 1 TABLET (10 MG TOTAL) BY MOUTH DAILY BEFORE BREAKFAST. (Patient taking differently: Take 10 mg by mouth daily before  breakfast.) 30 tablet 6  . doxycycline (VIBRA-TABS) 100 MG tablet Take 1 tablet (100 mg total) by mouth every 12 (twelve) hours for 4 days. Antibiotic. 8 tablet 0  . Febuxostat 80 MG TABS Take 1 tablet (80 mg total) by mouth once daily for 180 days 90 tablet 1  . FERREX 150 150 MG capsule Take 150 mg by mouth daily.     . fluticasone (FLONASE) 50 MCG/ACT nasal spray PLACE 2 SPRAYS INTO BOTH NOSTRILS DAILY. (Patient taking differently: Place 2 sprays into both nostrils at bedtime.) 16 g 11  . gabapentin (NEURONTIN) 300 MG capsule TAKE 1 CAPSULE BY MOUTH TWICE DAILY (Patient taking differently: Take 300 mg by mouth 2 (two) times daily.) 180 capsule 3  . HUMALOG KWIKPEN 100 UNIT/ML KwikPen Inject 5-10 Units into the skin 3 (three) times daily. Sliding scale    . insulin glargine (LANTUS) 100 UNIT/ML Solostar Pen INJECT 40 UNITS SUBCUTANEOUSLY NIGHTLY (Patient taking differently: Inject 40 Units into the skin at bedtime.) 15 mL 11  . Insulin Pen Needle 31G X 5 MM MISC USE AS DIRECTED FOUR TIMES DAILY. (Patient taking differently: 4 (four) times daily. as directed) 400 each 3  . metolazone (ZAROXOLYN) 2.5 MG tablet TAKE 2 TABLETS (5 MG) BY MOUTH ONCE A WEEK. (Patient taking differently: Take 5 mg by mouth 2 (two) times a week. Usually tuedays and either Friday/saturday) 10 tablet 5  . montelukast (SINGULAIR) 10 MG tablet TAKE 1 TABLET BY MOUTH AT  BEDTIME. (Patient taking differently: Take 10 mg by mouth at bedtime.) 90 tablet 3  . Multiple Vitamin (MULTIVITAMIN WITH MINERALS) TABS tablet Take 1 tablet by mouth daily.    . mycophenolate (MYFORTIC) 180 MG EC tablet Take 360 mg by mouth 2 (two) times daily.    Marland Kitchen omeprazole (PRILOSEC) 20 MG capsule TAKE 1 CAPSULE BY MOUTH DAILY. (Patient taking differently: Take 20 mg by mouth daily.) 90 capsule 3  . potassium chloride (MICRO-K) 10 MEQ CR capsule Take 7 capsules (70 mEq total) by mouth 2 (two) times daily. Take an extra tab on Tue and Fri with metolazone 360  capsule 3  . predniSONE (DELTASONE) 5 MG tablet Take 5 mg by mouth daily with breakfast.    . rosuvastatin (CRESTOR) 10 MG tablet TAKE 1 TABLET BY MOUTH DAILY (Patient taking differently: Take 10 mg by mouth daily.) 90 tablet 2  . sulfamethoxazole-trimethoprim (BACTRIM) 400-80 MG tablet TAKE 1 TABLET BY MOUTH EVERY MONDAY, WEDNESDAY, FRIDAY. (Patient taking differently: Take 1 tablet by mouth 3 (three) times a week. MWF) 36 tablet 1  . tacrolimus (PROGRAF) 0.5 MG capsule TAKE 1 CAPSULE (0.5 MG TOTAL) BY MOUTH EVERY EVENING. (Patient taking differently: Take 0.5-1.5 mg by mouth 2 (two) times daily. 1.75m in am and 0.575min evenings.) 30 capsule 5  . tadalafil, PAH, (ADCIRCA) 20 MG tablet TAKE 1 TABLET (20 MG TOTAL) BY MOUTH EVERY OTHER DAY. (Patient taking differently: Take 20 mg by mouth every other day.) 60 tablet 1  . tamsulosin (FLOMAX) 0.4 MG CAPS capsule TAKE 1 CAPSULE BY MOUTH DAILY. (Patient taking differently: Take 0.4 mg by mouth daily.) 90 capsule 3  . torsemide (DEMADEX) 20 MG tablet Take 5 tablets (100 mg total) by mouth 2 (two) times daily.    . vitamin C (ASCORBIC ACID) 250 MG tablet Take 1 tablet (250 mg total) by mouth daily. 30 tablet 0  . zinc sulfate 220 (50 Zn) MG capsule Take 1 capsule (220 mg total) by mouth 2 (two) times daily. 60 capsule 0   No current facility-administered medications on file prior to visit.    Allergies  Allergen Reactions  . Contrast Media [Iodinated Diagnostic Agents]     Kidney transplant  . Ibuprofen Other (See Comments)    Due to kidney transplant Due to kidney transplant Due to kidney transplant    Past Medical History:  Diagnosis Date  . Allergy    seasonal  . Amnestic MCI (mild cognitive impairment with memory loss)   . Asthma   . Benign neoplasm of colon   . CAD (coronary artery disease) 2005   a.  Status post four-vessel CABG in 2005; b. 03/2018 MV: Small, fixed inferoapical and apical septal defect. No ischemia. EF 47% (60-65% by  05/2018 Echo).  . Chronic atrial fibrillation ([facility]   a. CHADS2VASc 4 (CHF, HTN, DM, vascular disease)--> Eliquis; b. 09/2018 Zio: Chronic Afib.  . Chronic combined systolic and diastolic CHF (congestive heart failure) ([facility]CC[place]   a.  7/18 Echo: EF 45-50%; b. 05/2018 Echo: EF 60-65%, RVSP 74.8; c. 12/2018 Echo: EF 50-55%. Sev dil LA.  . Marland Kitchenhronic venous stasis dermatitis of both lower extremities   . Cytomegaloviral disease (HCTorrey2017  . Depression   . Diabetes mellitus   . Diverticulosis   . Dyspnea   . Erectile dysfunction   . ESRD (end stage renal disease) (HCWilkinsburg2005   a. HD 2004-2012; b. S/p renal transplant in 2012  . GERD (gastroesophageal reflux disease)   .  Gout   . Hyperlipidemia    low since renal failure  . Hypertension   . Kidney transplant status, cadaveric 2012  . Obesity   . PAH (pulmonary artery hypertension) ([place])    a. 05/2018 Echo: RVSP 74.81mHg; b. 12/2018 RV not well visualized.  . Pneumonia   . Purpura ([place]   . Sleep apnea    bipap with oxygen 2 l  . Trigger finger   . Ulcer 05/2016   Left shin  . Wears glasses     Past Surgical History:  Procedure Laterality Date  . BARIATRIC SURGERY    . CARDIAC CATHETERIZATION     [facility]  . CARDIAC CATHETERIZATION  05/03/2019  . CATARACT EXTRACTION W/PHACO Right 10/29/2017   Procedure: CATARACT EXTRACTION PHACO AND INTRAOCULAR LENS PLACEMENT (IFair[place]  RIGHT DIABETIC;  Surgeon: BLeandrew Koyanagi MD;  [name]  Service: Ophthalmology;  Laterality: Right;  Diabetic - insulin  . CATARACT EXTRACTION W/PHACO Left 11/18/2017   Procedure: CATARACT EXTRACTION PHACO AND INTRAOCULAR LENS PLACEMENT (IB[place] LEFT IVA/TOPICAL;  Surgeon: BLeandrew Koyanagi MD;  [facility]  Service: Ophthalmology;  Laterality: Left;  DIABETES - insulin sleep apnea  . COLONOSCOPY WITH PROPOFOL N/A 04/22/2013   Procedure: COLONOSCOPY WITH PROPOFOL;  Surgeon: DMilus Banister MD;  [facility];  Service:  Endoscopy;  Laterality: N/A;  . CORONARY ARTERY BYPASS GRAFT  2005   X 4  . DG ANGIO AV SHUNT*L*     right and left upper arms  . FASCIOTOMY  03/03/2012   Procedure: FASCIOTOMY;  Surgeon: GWynonia Sours MD;  [place]  Service: Orthopedics;  Laterality: Right;  FASCIOTOMY RIGHT SMALL FINGER  . FASCIOTOMY Left 08/17/2013   Procedure: FASCIOTOMY LEFT RING;  Surgeon: GWynonia Sours MD;  Location: [facility]  Service: Orthopedics;  Laterality: Left;  . INCISION AND DRAINAGE ABSCESS Left 10/15/2015   Procedure: INCISION AND DRAINAGE ABSCESS;  Surgeon: DJules Husbands MD;  [facility] ORS;  Service: General;  Laterality: Left;  . KIDNEY TRANSPLANT  09/13/2010   cadaver--at [facility]  . RIGHT HEART CATH N/A 11/15/2016   Procedure: RIGHT HEART CATH;  Surgeon: AWellington Hampshire MD;  [facility];  Service: Cardiovascular;  Laterality: N/A;  . RIGHT HEART CATH N/A 05/03/2019   Procedure: RIGHT HEART CATH;  Surgeon: MLarey Dresser MD;  [place];  Service: Cardiovascular;  Laterality: N/A;  . TYMPANIC MEMBRANE REPAIR  1/12   left  . VASECTOMY      Family History  Problem Relation Age of Onset  . Heart disease Father   . Kidney failure Father   . Kidney disease Father   . Diabetes Maternal Grandmother   . Breast cancer Maternal Grandmother   . Valvular heart disease Mother   . Liver cancer Paternal Uncle   . Liver cancer Paternal Grandmother   . Prostate cancer Neg Hx     Social History   Socioeconomic History  . Marital status: Married    Spouse name: Not on file  . Number of children: 2  . Years of education: Not on file  . Highest education level: Not on file  Occupational History  . Occupation: MWarden/ranger unemployed    Comment: disabled due to kidney failure  Tobacco Use  . Smoking status: Former Smoker    Types: Cigars    Quit date: 09/02/1994    Years since quitting:  25.9  . Smokeless tobacco: Never Used  Vaping Use  . Vaping Use: Never used  Substance and Sexual Activity  . Alcohol use: Yes    Alcohol/week: 0.0 - 1.0 standard drinks    Comment: occasional- 3 in the past year  . Drug use: No  . Sexual activity: Not on file  Other Topics Concern  . Not on file  Social History Narrative   In school for culinary arts      Has living will   Wife is health care POA   Would accept resuscitation attempts   Hasn't considered tube feedings   Social Determinants of Health   Financial Resource Strain: Not on file  Food Insecurity: Not on file  Transportation Needs: Not on file  Physical Activity: Not on file  Stress: Not on file  Social Connections: Not on file  Intimate Partner Violence: Not on file   Review of Systems Some loose stools Appetite is off some still No abdominal pain    Objective:   Physical Exam Skin:    Comments: Sig stasis changes (very dark) on calves Healthy dark eschar over mid left calf No sig redness or warmth Foot is normal            Assessment & Plan:

## 2020-07-31 ENCOUNTER — Other Ambulatory Visit: Payer: Self-pay | Admitting: *Deleted

## 2020-07-31 NOTE — Patient Outreach (Signed)
[facility]) Care Management  07/31/2020  [name] [birth year] [id number]   Transition of care call Referral received: 07/23/20 Initial outreach attempt: 07/26/20 Insurance: [place]    2nd unsuccessful telephone call to patient's preferred contact number in order to complete post hospital discharge transition of care assessment , no answer left HIPAA compliant message requesting return call.    Objective: Per the electronic medical record, Mr.[name]   was hospitalized at [facility] 4/30-07/24/20  With/for Left Leg Cellulitis  . Comorbidities include: ESRD, s/p renal transplant, CAD s/p CABG, atrial fib, Chronic venous stasis involving lower extremity, Chronic Diastolic heart failure He was discharged to home on 07/24/20  without the need for home health services or durable medical equipment per the discharge summary.    Plan If no return call from patient will attempt 3rd outreach in the next 4 business days.   Joylene Draft, RN, BSN  [place] Management Coordinator  [phone]- Mobile [phone]- Toll Free Main Office

## 2020-08-03 ENCOUNTER — Other Ambulatory Visit: Payer: Self-pay | Admitting: *Deleted

## 2020-08-03 ENCOUNTER — Encounter: Payer: Self-pay | Admitting: *Deleted

## 2020-08-03 NOTE — Patient Outreach (Addendum)
[facility]) Care Management  08/03/2020  [name] 12-Apr-1960 [id number]   Transition of care call  Referral received: 07/23/20 Initial outreach attempt: 07/26/20 Insurance: [place] unsuccessful telephone call to patient's preferred contact number in order to complete post hospital discharge transition of care assessment; no answer, left HIPAA compliant message requesting return call.   Objective: Per the electronic medical record, Carl Garciawas [facility]22With/for Left Leg Cellulitis. Comorbidities include: ESRD, s/p renal transplant, CAD s/p CABG, atrial fib, Chronic venous stasis involving lower extremity, Chronic Diastolic heart failureHe was discharged to home on5/2/22without the need for home health services or durable medical equipment per the discharge summary.   Plan: If no return call from patient, will plan return call in the next 3 weeks.   Transition of care call/case closure   Subjective: Return call from patien t 3rd successful telephone call to patient's preferred number in order to complete transition of care assessment; 2 HIPAA identifiers verified. Explained purpose of call and completed transition of care assessment.  [name] states that he is doing really good, back to normal.  He states right leg site is back to normal, no increase in swelling or redness, he has completed antibiotics. He reports tolerating usual activity. He is tolerating diet, denies bowel or bladder problems. Patient discussed continuing to monitor daily weights no sudden weight increases. He reports continuing to watch diet, sodium intake. He discussed working on [employer] choices such as pasta as far as diabetes with A1c 8.3% . He monitors blood sugar with Freestyle Libre 4 times a day, reports 60 days average is 169, he has upcoming endocrinology appointment in this month. He reports  continuing to go to gym 4 to 5 days a week to workout,including walking on the treadmill. He declined need for additional educational resource information.  Spouse/children are assisting with his recovery.   Reviewed accessing the following [place] Benefits : He discussed  ongoing health issues, diabetes, heart failure and is enrolled in and participating in Active health management health coach calls..  He uses a [facility] outpatient pharmacy [facility] employee pharmacy.  Assessment:  Patient voices good understanding of all discharge instructions.  See transition of care flowsheet for assessment details.   Plan:  Reviewed hospital discharge diagnosis of left leg cellulitis   and discharge treatment plan using hospital discharge instructions, assessing medication adherence, reviewing problems requiring provider notification, and discussing the importance of follow up with surgeon, primary care provider and/or specialists as directed.  Reviewed [place] healthy lifestyle program information to receive discounted premium for  2023   Step 1: Get  your annual physical  Step 2: Complete your health assessment  Step 3:Identify your current health status and complete the corresponding action step between March 25, 2020 and November 23, 2020.    Using [place] website, verified that patient is an active participate in [place]'s Active Health Management chronic disease management program.    No ongoing care management needs identified so will close case to [place] Management services and route successful outreach letter with [place] Management pamphlet and 24 Hour Nurse Line Magnet to [place] Management clinical pool to be mailed to patient's home address.    Joylene Draft, RN, BSN  [place] Management Coordinator  [phone]- Mobile [phone]- Toll Free Main Office

## 2020-08-05 MED FILL — Tacrolimus Cap 0.5 MG: ORAL | 30 days supply | Qty: 30 | Fill #1 | Status: AC

## 2020-08-07 ENCOUNTER — Other Ambulatory Visit: Payer: Self-pay

## 2020-08-10 DIAGNOSIS — E113593 Type 2 diabetes mellitus with proliferative diabetic retinopathy without macular edema, bilateral: Secondary | ICD-10-CM | POA: Diagnosis not present

## 2020-08-10 LAB — HM DIABETES EYE EXAM

## 2020-08-14 ENCOUNTER — Other Ambulatory Visit: Payer: Self-pay

## 2020-08-14 ENCOUNTER — Telehealth: Payer: Self-pay

## 2020-08-14 DIAGNOSIS — Z792 Long term (current) use of antibiotics: Secondary | ICD-10-CM | POA: Diagnosis not present

## 2020-08-14 DIAGNOSIS — Z6838 Body mass index (BMI) 38.0-38.9, adult: Secondary | ICD-10-CM | POA: Diagnosis not present

## 2020-08-14 DIAGNOSIS — Z79899 Other long term (current) drug therapy: Secondary | ICD-10-CM | POA: Diagnosis not present

## 2020-08-14 DIAGNOSIS — D509 Iron deficiency anemia, unspecified: Secondary | ICD-10-CM | POA: Diagnosis not present

## 2020-08-14 DIAGNOSIS — M109 Gout, unspecified: Secondary | ICD-10-CM | POA: Diagnosis not present

## 2020-08-14 DIAGNOSIS — Z940 Kidney transplant status: Secondary | ICD-10-CM | POA: Diagnosis not present

## 2020-08-14 DIAGNOSIS — Z7984 Long term (current) use of oral hypoglycemic drugs: Secondary | ICD-10-CM | POA: Diagnosis not present

## 2020-08-14 DIAGNOSIS — E1122 Type 2 diabetes mellitus with diabetic chronic kidney disease: Secondary | ICD-10-CM | POA: Diagnosis not present

## 2020-08-14 DIAGNOSIS — Z7952 Long term (current) use of systemic steroids: Secondary | ICD-10-CM | POA: Diagnosis not present

## 2020-08-14 DIAGNOSIS — M1A9XX0 Chronic gout, unspecified, without tophus (tophi): Secondary | ICD-10-CM | POA: Diagnosis not present

## 2020-08-14 DIAGNOSIS — E669 Obesity, unspecified: Secondary | ICD-10-CM | POA: Diagnosis not present

## 2020-08-14 DIAGNOSIS — I482 Chronic atrial fibrillation, unspecified: Secondary | ICD-10-CM | POA: Diagnosis not present

## 2020-08-14 DIAGNOSIS — I5020 Unspecified systolic (congestive) heart failure: Secondary | ICD-10-CM | POA: Diagnosis not present

## 2020-08-14 DIAGNOSIS — Z9989 Dependence on other enabling machines and devices: Secondary | ICD-10-CM | POA: Diagnosis not present

## 2020-08-14 DIAGNOSIS — G4733 Obstructive sleep apnea (adult) (pediatric): Secondary | ICD-10-CM | POA: Diagnosis not present

## 2020-08-14 DIAGNOSIS — Z4822 Encounter for aftercare following kidney transplant: Secondary | ICD-10-CM | POA: Diagnosis not present

## 2020-08-14 DIAGNOSIS — I129 Hypertensive chronic kidney disease with stage 1 through stage 4 chronic kidney disease, or unspecified chronic kidney disease: Secondary | ICD-10-CM | POA: Diagnosis not present

## 2020-08-14 DIAGNOSIS — E1142 Type 2 diabetes mellitus with diabetic polyneuropathy: Secondary | ICD-10-CM | POA: Diagnosis not present

## 2020-08-14 DIAGNOSIS — D849 Immunodeficiency, unspecified: Secondary | ICD-10-CM | POA: Diagnosis not present

## 2020-08-14 DIAGNOSIS — E876 Hypokalemia: Secondary | ICD-10-CM | POA: Diagnosis not present

## 2020-08-14 DIAGNOSIS — I2510 Atherosclerotic heart disease of native coronary artery without angina pectoris: Secondary | ICD-10-CM | POA: Diagnosis not present

## 2020-08-14 DIAGNOSIS — K219 Gastro-esophageal reflux disease without esophagitis: Secondary | ICD-10-CM | POA: Diagnosis not present

## 2020-08-14 DIAGNOSIS — N183 Chronic kidney disease, stage 3 unspecified: Secondary | ICD-10-CM | POA: Diagnosis not present

## 2020-08-14 DIAGNOSIS — I10 Essential (primary) hypertension: Secondary | ICD-10-CM | POA: Diagnosis not present

## 2020-08-14 DIAGNOSIS — Z794 Long term (current) use of insulin: Secondary | ICD-10-CM | POA: Diagnosis not present

## 2020-08-14 DIAGNOSIS — N400 Enlarged prostate without lower urinary tract symptoms: Secondary | ICD-10-CM | POA: Diagnosis not present

## 2020-08-14 DIAGNOSIS — I272 Pulmonary hypertension, unspecified: Secondary | ICD-10-CM | POA: Diagnosis not present

## 2020-08-14 DIAGNOSIS — Z951 Presence of aortocoronary bypass graft: Secondary | ICD-10-CM | POA: Diagnosis not present

## 2020-08-14 MED ORDER — PREDNISONE 5 MG PO TABS
5.0000 mg | ORAL_TABLET | Freq: Every day | ORAL | 3 refills | Status: DC
  Filled 2020-08-14 (×2): qty 90, 90d supply, fill #0
  Filled 2020-10-20: qty 90, 90d supply, fill #1
  Filled 2021-02-08: qty 90, 90d supply, fill #2
  Filled 2021-04-30: qty 90, 90d supply, fill #3

## 2020-08-14 MED ORDER — TACROLIMUS 0.5 MG PO CAPS: ORAL_CAPSULE | ORAL | 5 refills | Status: DC

## 2020-08-14 NOTE — Telephone Encounter (Signed)
Spoke to [facility] pharm- prednisone and Tacrolimus was sent in for refill- Dr Silvio Pate is pt's pcp, hasn't seen Jones. Needs to get these meds from [facility]

## 2020-08-14 NOTE — Telephone Encounter (Signed)
Looking further into the 2 prescriptions, I see where pt sees a Dr Belinda Block. That is who called the prescriptions in, not Jones. I called pharmacy and notified them of this- they will request these from that MD again.

## 2020-08-15 ENCOUNTER — Other Ambulatory Visit: Payer: Self-pay

## 2020-08-22 ENCOUNTER — Other Ambulatory Visit: Payer: Self-pay

## 2020-08-22 ENCOUNTER — Other Ambulatory Visit: Payer: Self-pay | Admitting: Family Medicine

## 2020-08-22 MED FILL — Tadalafil Tab 20 MG (PAH): ORAL | 30 days supply | Qty: 15 | Fill #1 | Status: AC

## 2020-08-22 MED FILL — Amoxicillin (Trihydrate) Cap 500 MG: ORAL | 1 days supply | Qty: 4 | Fill #1 | Status: AC

## 2020-08-22 MED FILL — Insulin Glargine Soln Pen-Injector 100 Unit/ML: SUBCUTANEOUS | 30 days supply | Qty: 15 | Fill #0 | Status: CN

## 2020-08-22 MED FILL — Metolazone Tab 2.5 MG: ORAL | 30 days supply | Qty: 10 | Fill #0 | Status: AC

## 2020-08-22 MED FILL — Bupropion HCl Tab ER 12HR 150 MG: ORAL | 90 days supply | Qty: 180 | Fill #0 | Status: AC

## 2020-08-22 MED FILL — Gabapentin Cap 300 MG: ORAL | 90 days supply | Qty: 180 | Fill #0 | Status: AC

## 2020-08-22 MED FILL — Dapagliflozin Propanediol Tab 10 MG (Base Equivalent): ORAL | 30 days supply | Qty: 30 | Fill #1 | Status: AC

## 2020-08-22 MED FILL — Tamsulosin HCl Cap 0.4 MG: ORAL | 90 days supply | Qty: 90 | Fill #0 | Status: AC

## 2020-08-22 MED FILL — Rosuvastatin Calcium Tab 10 MG: ORAL | 90 days supply | Qty: 90 | Fill #0 | Status: AC

## 2020-08-22 MED FILL — Insulin Glargine-yfgn Soln Pen-Injector 100 Unit/ML: SUBCUTANEOUS | 30 days supply | Qty: 15 | Fill #1 | Status: AC

## 2020-08-23 ENCOUNTER — Other Ambulatory Visit: Payer: Self-pay

## 2020-08-23 MED ORDER — SULFAMETHOXAZOLE-TRIMETHOPRIM 400-80 MG PO TABS
ORAL_TABLET | ORAL | 3 refills | Status: DC
  Filled 2020-08-23: qty 36, 84d supply, fill #0
  Filled 2020-12-14: qty 36, 84d supply, fill #1
  Filled 2021-03-08: qty 36, 84d supply, fill #2
  Filled 2021-03-20 – 2021-06-11 (×2): qty 36, 84d supply, fill #3

## 2020-08-23 MED FILL — Torsemide Tab 20 MG: ORAL | 30 days supply | Qty: 300 | Fill #0 | Status: AC

## 2020-08-24 ENCOUNTER — Other Ambulatory Visit: Payer: Self-pay

## 2020-08-24 MED ORDER — COLCHICINE 0.6 MG PO TABS
0.6000 mg | ORAL_TABLET | ORAL | 1 refills | Status: DC
  Filled 2020-08-24: qty 30, 60d supply, fill #0

## 2020-08-25 ENCOUNTER — Other Ambulatory Visit: Payer: Self-pay

## 2020-08-25 DIAGNOSIS — G4733 Obstructive sleep apnea (adult) (pediatric): Secondary | ICD-10-CM | POA: Diagnosis not present

## 2020-08-25 DIAGNOSIS — E662 Morbid (severe) obesity with alveolar hypoventilation: Secondary | ICD-10-CM | POA: Diagnosis not present

## 2020-08-28 DIAGNOSIS — E1165 Type 2 diabetes mellitus with hyperglycemia: Secondary | ICD-10-CM | POA: Diagnosis not present

## 2020-09-01 ENCOUNTER — Other Ambulatory Visit: Payer: Self-pay

## 2020-09-06 ENCOUNTER — Other Ambulatory Visit: Payer: Self-pay

## 2020-09-06 MED ORDER — POLYSACCHARIDE IRON COMPLEX 150 MG PO CAPS
150.0000 mg | ORAL_CAPSULE | Freq: Two times a day (BID) | ORAL | 1 refills | Status: DC
  Filled 2020-09-06: qty 180, 90d supply, fill #0

## 2020-09-06 MED ORDER — TACROLIMUS 1 MG PO CAPS
ORAL_CAPSULE | ORAL | 1 refills | Status: DC
  Filled 2020-09-06: qty 180, 90d supply, fill #0

## 2020-09-06 MED FILL — Tacrolimus Cap 0.5 MG: ORAL | 30 days supply | Qty: 30 | Fill #2 | Status: AC

## 2020-09-07 ENCOUNTER — Other Ambulatory Visit: Payer: Self-pay

## 2020-09-13 ENCOUNTER — Other Ambulatory Visit: Payer: Self-pay

## 2020-09-13 ENCOUNTER — Telehealth (INDEPENDENT_AMBULATORY_CARE_PROVIDER_SITE_OTHER): Payer: 59 | Admitting: Family Medicine

## 2020-09-13 ENCOUNTER — Encounter: Payer: Self-pay | Admitting: Family Medicine

## 2020-09-13 VITALS — Temp 98.5°F | Ht 68.0 in | Wt 265.0 lb

## 2020-09-13 DIAGNOSIS — J208 Acute bronchitis due to other specified organisms: Secondary | ICD-10-CM

## 2020-09-13 DIAGNOSIS — D849 Immunodeficiency, unspecified: Secondary | ICD-10-CM

## 2020-09-13 DIAGNOSIS — I2510 Atherosclerotic heart disease of native coronary artery without angina pectoris: Secondary | ICD-10-CM

## 2020-09-13 DIAGNOSIS — J4541 Moderate persistent asthma with (acute) exacerbation: Secondary | ICD-10-CM

## 2020-09-13 DIAGNOSIS — Z940 Kidney transplant status: Secondary | ICD-10-CM | POA: Diagnosis not present

## 2020-09-13 MED ORDER — AZITHROMYCIN 250 MG PO TABS
ORAL_TABLET | ORAL | 0 refills | Status: AC
  Filled 2020-09-13: qty 6, 5d supply, fill #0

## 2020-09-13 MED ORDER — GUAIFENESIN-CODEINE 100-10 MG/5ML PO SOLN
5.0000 mL | Freq: Four times a day (QID) | ORAL | 0 refills | Status: DC | PRN
  Filled 2020-09-13: qty 100, 5d supply, fill #0

## 2020-09-13 MED ORDER — BENZONATATE 200 MG PO CAPS
200.0000 mg | ORAL_CAPSULE | Freq: Three times a day (TID) | ORAL | 1 refills | Status: DC | PRN
  Filled 2020-09-13: qty 40, 14d supply, fill #0

## 2020-09-13 MED ORDER — ALBUTEROL SULFATE (2.5 MG/3ML) 0.083% IN NEBU
2.5000 mg | INHALATION_SOLUTION | Freq: Four times a day (QID) | RESPIRATORY_TRACT | 1 refills | Status: DC | PRN
  Filled 2020-09-13: qty 150, 13d supply, fill #0

## 2020-09-13 MED ORDER — ALBUTEROL SULFATE HFA 108 (90 BASE) MCG/ACT IN AERS
2.0000 | INHALATION_SPRAY | Freq: Four times a day (QID) | RESPIRATORY_TRACT | 5 refills | Status: DC | PRN
  Filled 2020-09-13: qty 18, 25d supply, fill #0

## 2020-09-13 MED ORDER — BECLOMETHASONE DIPROP HFA 80 MCG/ACT IN AERB
2.0000 | INHALATION_SPRAY | Freq: Two times a day (BID) | RESPIRATORY_TRACT | 5 refills | Status: DC
  Filled 2020-09-13 – 2020-09-20 (×3): qty 10.6, 30d supply, fill #0

## 2020-09-13 NOTE — Progress Notes (Signed)
Ara Mano T. Valeta Paz, MD Primary Care and Sports Medicine [facility] at [facility] [place], [address] Phone: [phone]  FAX: [place] y.o. male  MRN [id number]  Date of Birth: October 24, 1960  Visit Date: 09/13/2020  PCP: [name], MD  Referred by: [name], MD  Virtual Visit via Video Note:  I connected with  Meric Delia Sitar. on 09/13/2020 11:00 AM EDT by a video enabled telemedicine application and verified that I am speaking with the correct person using two identifiers.   Location patient: home computer, tablet, or smartphone Location provider: work or home office Consent: Verbal consent directly obtained from [employer].. Persons participating in the virtual visit: patient, provider  I discussed the limitations of evaluation and management by telemedicine and the availability of in person appointments. The patient expressed understanding and agreed to proceed.  Chief Complaint  Patient presents with   Cough   Fever   Chills   Fatigue    Negative Covid on Monday with ACHD    History of Present Illness:  Thursday, started to feel bad, Covid PCR on Monday.  This was done at the health department, and at that point he had been symptomatic for 5 days.  Could not sleep for 3 days. Coughing up sputum.  There is no blood. He has had some fever and chills.  No SOB HA from coughing a lot.  Moderate persistent asthma, Has a neb, did albuterol, seemed to help. He is currently out of his Qvar.  Previously with end-stage renal disease and he is status post kidney transplant approximately 10 years ago.  No covid No fever  Cr is 2 GFR: 36  Other risk factors include congestive heart failure diabetes, immunosuppression chronically, pulmonary hypertension, A. fib.  Codeine syrup Tessalon  Review of Systems as above: See pertinent positives and pertinent  negatives per HPI No acute distress verbally   Observations/Objective/Exam:  An attempt was made to discern vital signs over the phone and per patient if applicable and possible.   General:    Alert, Oriented, appears well and in no acute distress  Pulmonary:     On inspection no signs of respiratory distress.  Psych / Neurological:     Pleasant and cooperative.  Assessment and Plan:    ICD-10-CM   1. Acute bronchitis due to other specified organisms  J20.8     2. Immunosuppressed status ([place])  D84.9     3. Moderate persistent asthma with acute exacerbation  J45.41     4. History of renal transplant  U72.5      Complicated medical patient.  With a 5-day history of lead time and a negative PCR, I think that we can feel pretty confident that he does not have COVID-19.  Does have asthma, and he is not taking his scheduled inhaled steroid, since he is out of it.  He is also out of all of his forms of albuterol.  I will refill all of this and he can use them as needed.  In a renal transplant, I worry more about potential bad outcomes, and he has been sick for 7 days.  Gve him some Z-Pak for any form of bacterial upper pathogen.  I discussed the assessment and treatment plan with the patient. The patient was provided an opportunity to ask questions and all were answered. The patient agreed with the plan and demonstrated  an understanding of the instructions.   The patient was advised to call back or seek an in-person evaluation if the symptoms worsen or if the condition fails to improve as anticipated.  Follow-up: prn unless noted otherwise below No follow-ups on file.  Meds ordered this encounter  Medications   azithromycin (ZITHROMAX) 250 MG tablet    Sig: Take 2 tablets (500 mg total) by mouth daily for 1 day, THEN 1 tablet (250 mg total) daily for 4 days.    Dispense:  6 tablet    Refill:  0   albuterol (VENTOLIN HFA) 108 (90 Base) MCG/ACT inhaler    Sig: Inhale 2 puffs  into the lungs every 6 (six) hours as needed for wheezing or shortness of breath.    Dispense:  18 g    Refill:  5   benzonatate (TESSALON) 200 MG capsule    Sig: Take 1 capsule (200 mg total) by mouth 3 (three) times daily as needed for cough.    Dispense:  40 capsule    Refill:  1   guaiFENesin-codeine 100-10 MG/5ML syrup    Sig: Take 5 mLs by mouth every 6 (six) hours as needed for cough.    Dispense:  100 mL    Refill:  0   beclomethasone (QVAR) 80 MCG/ACT inhaler    Sig: Inhale 2 puffs into the lungs 2 (two) times daily.    Dispense:  10.6 g    Refill:  5   albuterol (PROVENTIL) (2.5 MG/3ML) 0.083% nebulizer solution    Sig: Take 3 mLs (2.5 mg total) by nebulization every 6 (six) hours as needed for wheezing or shortness of breath.    Dispense:  150 mL    Refill:  1   No orders of the defined types were placed in this encounter.   Signed,  Maud Deed. Dametri Ozburn, MD

## 2020-09-15 ENCOUNTER — Other Ambulatory Visit: Payer: Self-pay | Admitting: *Deleted

## 2020-09-18 ENCOUNTER — Telehealth: Payer: Self-pay | Admitting: *Deleted

## 2020-09-18 NOTE — Telephone Encounter (Signed)
Left message for [name] to return my call.  I am trying to complete PA for his QVAR and need to know if he has ever used Arboriculturist Diskus/Flovent HFA?  Or if there is a medication reason why he can't try the step therapy drugs offered?

## 2020-09-18 NOTE — Telephone Encounter (Signed)
Spoke with [name].  He has not ever had a trial of Arnuity Ellipta or Flovent Diskus/Flovent HFA.  PA completed and faxed back to [phone].  If insurance denies PA,  patient is willing to try one of the preferred medication.

## 2020-09-19 ENCOUNTER — Encounter (HOSPITAL_COMMUNITY): Payer: Self-pay

## 2020-09-19 ENCOUNTER — Other Ambulatory Visit (HOSPITAL_COMMUNITY): Payer: Self-pay
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
## 2020-09-20 ENCOUNTER — Other Ambulatory Visit (HOSPITAL_COMMUNITY): Payer: Self-pay | Admitting: Cardiology

## 2020-09-20 ENCOUNTER — Other Ambulatory Visit: Payer: Self-pay

## 2020-09-20 MED ORDER — POTASSIUM CHLORIDE ER 8 MEQ PO CPCR
70.0000 meq | ORAL_CAPSULE | Freq: Two times a day (BID) | ORAL | 3 refills | Status: DC
  Filled 2020-09-20: qty 450, 25d supply, fill #0

## 2020-09-21 ENCOUNTER — Other Ambulatory Visit: Payer: Self-pay | Admitting: Internal Medicine

## 2020-09-21 ENCOUNTER — Other Ambulatory Visit: Payer: Self-pay

## 2020-09-21 DIAGNOSIS — H26492 Other secondary cataract, left eye: Secondary | ICD-10-CM | POA: Diagnosis not present

## 2020-09-21 MED ORDER — MONTELUKAST SODIUM 10 MG PO TABS
ORAL_TABLET | Freq: Every day | ORAL | 3 refills | Status: DC
  Filled 2020-09-21: qty 90, 90d supply, fill #0

## 2020-09-21 MED FILL — Omeprazole Cap Delayed Release 20 MG: ORAL | 90 days supply | Qty: 90 | Fill #1 | Status: AC

## 2020-09-21 MED FILL — Tadalafil Tab 20 MG (PAH): ORAL | 30 days supply | Qty: 15 | Fill #2 | Status: AC

## 2020-09-21 MED FILL — Tacrolimus Cap 0.5 MG: ORAL | 30 days supply | Qty: 30 | Fill #3 | Status: CN

## 2020-09-21 MED FILL — Dapagliflozin Propanediol Tab 10 MG (Base Equivalent): ORAL | 30 days supply | Qty: 30 | Fill #2 | Status: AC

## 2020-09-22 ENCOUNTER — Other Ambulatory Visit: Payer: Self-pay

## 2020-09-24 DIAGNOSIS — E662 Morbid (severe) obesity with alveolar hypoventilation: Secondary | ICD-10-CM | POA: Diagnosis not present

## 2020-09-24 DIAGNOSIS — G4733 Obstructive sleep apnea (adult) (pediatric): Secondary | ICD-10-CM | POA: Diagnosis not present

## 2020-09-26 ENCOUNTER — Other Ambulatory Visit: Payer: Self-pay

## 2020-09-27 ENCOUNTER — Other Ambulatory Visit: Payer: Self-pay

## 2020-09-27 DIAGNOSIS — E1165 Type 2 diabetes mellitus with hyperglycemia: Secondary | ICD-10-CM | POA: Diagnosis not present

## 2020-09-29 ENCOUNTER — Other Ambulatory Visit (HOSPITAL_COMMUNITY): Payer: Self-pay

## 2020-10-02 ENCOUNTER — Other Ambulatory Visit: Payer: Self-pay

## 2020-10-02 MED FILL — Insulin Glargine-yfgn Soln Pen-Injector 100 Unit/ML: SUBCUTANEOUS | 37 days supply | Qty: 15 | Fill #2 | Status: AC

## 2020-10-04 DIAGNOSIS — M109 Gout, unspecified: Secondary | ICD-10-CM | POA: Diagnosis not present

## 2020-10-04 DIAGNOSIS — M0579 Rheumatoid arthritis with rheumatoid factor of multiple sites without organ or systems involvement: Secondary | ICD-10-CM | POA: Diagnosis not present

## 2020-10-04 DIAGNOSIS — Z79899 Other long term (current) drug therapy: Secondary | ICD-10-CM | POA: Diagnosis not present

## 2020-10-05 ENCOUNTER — Other Ambulatory Visit: Payer: Self-pay

## 2020-10-05 DIAGNOSIS — B351 Tinea unguium: Secondary | ICD-10-CM | POA: Diagnosis not present

## 2020-10-05 DIAGNOSIS — E119 Type 2 diabetes mellitus without complications: Secondary | ICD-10-CM | POA: Diagnosis not present

## 2020-10-06 ENCOUNTER — Ambulatory Visit (INDEPENDENT_AMBULATORY_CARE_PROVIDER_SITE_OTHER): Payer: 59 | Admitting: Internal Medicine

## 2020-10-06 ENCOUNTER — Other Ambulatory Visit: Payer: Self-pay

## 2020-10-06 ENCOUNTER — Encounter: Payer: Self-pay | Admitting: Psychology

## 2020-10-06 ENCOUNTER — Encounter: Payer: Self-pay | Admitting: Internal Medicine

## 2020-10-06 VITALS — BP 140/84 | HR 62 | Temp 97.7°F | Ht 70.0 in | Wt 268.0 lb

## 2020-10-06 DIAGNOSIS — Z794 Long term (current) use of insulin: Secondary | ICD-10-CM | POA: Diagnosis not present

## 2020-10-06 DIAGNOSIS — R41840 Attention and concentration deficit: Secondary | ICD-10-CM | POA: Insufficient documentation

## 2020-10-06 DIAGNOSIS — E1121 Type 2 diabetes mellitus with diabetic nephropathy: Secondary | ICD-10-CM

## 2020-10-06 DIAGNOSIS — I482 Chronic atrial fibrillation, unspecified: Secondary | ICD-10-CM | POA: Diagnosis not present

## 2020-10-06 DIAGNOSIS — I5032 Chronic diastolic (congestive) heart failure: Secondary | ICD-10-CM | POA: Diagnosis not present

## 2020-10-06 DIAGNOSIS — F3340 Major depressive disorder, recurrent, in remission, unspecified: Secondary | ICD-10-CM | POA: Diagnosis not present

## 2020-10-06 DIAGNOSIS — Z0000 Encounter for general adult medical examination without abnormal findings: Secondary | ICD-10-CM | POA: Diagnosis not present

## 2020-10-06 MED ORDER — INSULIN GLARGINE-YFGN 100 UNIT/ML ~~LOC~~ SOPN
PEN_INJECTOR | SUBCUTANEOUS | 5 refills | Status: DC
  Filled 2020-10-06: qty 15, 30d supply, fill #0
  Filled 2020-11-10: qty 15, 37d supply, fill #0
  Filled 2020-12-20: qty 15, 37d supply, fill #1
  Filled 2021-01-27: qty 15, 37d supply, fill #2
  Filled 2021-03-06: qty 15, 37d supply, fill #3
  Filled 2021-04-16: qty 15, 37d supply, fill #4
  Filled 2021-05-25: qty 15, 37d supply, fill #5

## 2020-10-06 NOTE — Assessment & Plan Note (Signed)
Doesn't seem to be related to sleep issues, depression, etc I suspect vascular based cognitive decline Will refer for neuropsych testing

## 2020-10-06 NOTE — Progress Notes (Signed)
Subjective:    Patient ID: [name]., male    DOB: 14-Nov-1960, 60 y.o.   MRN: [id number]  HPI Here for physical This visit occurred during the SARS-CoV-2 public health emergency.  Safety protocols were in place, including screening questions prior to the visit, additional usage of staff PPE, and extensive cleaning of exam room while observing appropriate contact time as indicated for disinfecting solutions.   Having some trouble focusing---like trying to do paperwork or concentrate on a project Sleeps okay---6-7 hours per night. Awakens refreshed  No depression or anxiety Never had attention problems  Doing okay with renal transplant GFR 41  Notes enlarging bulge in left abdomen Bothers him at times--like getting out of bed  Diabetes control is good Sees Dr Gabriel Carina for that  Dr Posey Pronto for rheumatology Gout controlled with febuxostat  Current Outpatient Medications on File Prior to Visit  Medication Sig Dispense Refill   albuterol (PROVENTIL) (2.5 MG/3ML) 0.083% nebulizer solution Take 3 mLs (2.5 mg total) by nebulization every 6 (six) hours as needed for wheezing or shortness of breath. 150 mL 1   albuterol (VENTOLIN HFA) 108 (90 Base) MCG/ACT inhaler Inhale 2 puffs into the lungs every 6 (six) hours as needed for wheezing or shortness of breath. 18 g 5   amoxicillin (AMOXIL) 500 MG capsule TAKE 4 CAPSULES (2,000 MG TOTAL) BY MOUTH 1 HOUR BEFORE DENTAL APPOINTMENT. 4 capsule 5   apixaban (ELIQUIS) 5 MG TABS tablet TAKE 1 TABLET BY MOUTH TWICE DAILY 180 tablet 1   beclomethasone (QVAR) 80 MCG/ACT inhaler Inhale 2 puffs into the lungs 2 (two) times daily. 10.6 g 5   benzonatate (TESSALON) 200 MG capsule Take 1 capsule (200 mg total) by mouth 3 (three) times daily as needed for cough. 40 capsule 1   buPROPion (WELLBUTRIN SR) 150 MG 12 hr tablet TAKE 1 TABLET BY MOUTH TWICE DAILY 180 tablet 3   cholecalciferol (VITAMIN D3) 25 MCG (1000 UNIT) tablet Take 1,000 Units by mouth  daily.     colchicine 0.6 MG tablet Take 1 tablet (0.6 mg total) by mouth every other day. 30 tablet 1   Cyanocobalamin (B-12 PO) Take by mouth.     dapagliflozin propanediol (FARXIGA) 10 MG TABS tablet TAKE 1 TABLET (10 MG TOTAL) BY MOUTH DAILY BEFORE BREAKFAST. 30 tablet 6   Febuxostat 80 MG TABS Take 1 tablet (80 mg total) by mouth once daily for 180 days 90 tablet 1   FERREX 150 150 MG capsule Take 150 mg by mouth daily.      fluticasone (FLONASE) 50 MCG/ACT nasal spray PLACE 2 SPRAYS INTO BOTH NOSTRILS DAILY. 16 g 11   gabapentin (NEURONTIN) 300 MG capsule TAKE 1 CAPSULE BY MOUTH TWICE DAILY 180 capsule 3   HUMALOG KWIKPEN 100 UNIT/ML KwikPen Inject 5-10 Units into the skin 3 (three) times daily. Sliding scale     Insulin Pen Needle 31G X 5 MM MISC USE AS DIRECTED FOUR TIMES DAILY. 400 each 3   metolazone (ZAROXOLYN) 2.5 MG tablet TAKE 2 TABLETS (5 MG) BY MOUTH ONCE A WEEK. 10 tablet 5   montelukast (SINGULAIR) 10 MG tablet TAKE 1 TABLET BY MOUTH AT BEDTIME. 90 tablet 3   Multiple Vitamin (MULTIVITAMIN WITH MINERALS) TABS tablet Take 1 tablet by mouth daily.     mycophenolate (MYFORTIC) 180 MG EC tablet Take 360 mg by mouth 2 (two) times daily.     omeprazole (PRILOSEC) 20 MG capsule TAKE 1 CAPSULE BY MOUTH DAILY. [place]  capsule 3   Potassium Chloride CR (MICRO-K) 8 MEQ CPCR capsule CR Take 9 capsules (72 mEq total) by mouth 2 (two) times daily. 450 capsule 3   predniSONE (DELTASONE) 5 MG tablet Take 1 tablet (5 mg total) by mouth daily. 90 tablet 3   rosuvastatin (CRESTOR) 10 MG tablet TAKE 1 TABLET BY MOUTH DAILY 90 tablet 2   sulfamethoxazole-trimethoprim (BACTRIM) 400-80 MG tablet TAKE 1 TABLET BY MOUTH EVERY MONDAY, WEDNESDAY, FRIDAY. 36 tablet 3   tacrolimus (PROGRAF) 0.5 MG capsule TAKE 1 CAPSULE (0.5 MG TOTAL) BY MOUTH EVERY EVENING. (Patient taking differently: Take 0.5-1.5 mg by mouth 2 (two) times daily. 1.46m in am and 0.562min evenings.) 30 capsule 5   tadalafil, PAH, (ADCIRCA) 20  MG tablet TAKE 1 TABLET (20 MG TOTAL) BY MOUTH EVERY OTHER DAY. 60 tablet 1   tamsulosin (FLOMAX) 0.4 MG CAPS capsule TAKE 1 CAPSULE BY MOUTH DAILY. 90 capsule 3   torsemide (DEMADEX) 20 MG tablet TAKE 5 TABLETS (100 MG TOTAL) BY MOUTH 2 (TWO) TIMES DAILY. 300 tablet 4   vitamin C (ASCORBIC ACID) 250 MG tablet Take 1 tablet (250 mg total) by mouth daily. 30 tablet 0   zinc sulfate 220 (50 Zn) MG capsule Take 1 capsule (220 mg total) by mouth 2 (two) times daily. 60 capsule 0   insulin glargine (LANTUS) 100 UNIT/ML Solostar Pen INJECT 40 UNITS SUBCUTANEOUSLY NIGHTLY 15 mL 11   No current facility-administered medications on file prior to visit.    Allergies  Allergen Reactions   Contrast Media [Iodinated Diagnostic Agents]     Kidney transplant   Ibuprofen Other (See Comments)    Due to kidney transplant Due to kidney transplant Due to kidney transplant    Past Medical History:  Diagnosis Date   Allergy    seasonal   Amnestic MCI (mild cognitive impairment with memory loss)    Asthma    Benign neoplasm of colon    CAD (coronary artery disease) 2005   a.  Status post four-vessel CABG in 2005; b. 03/2018 MV: Small, fixed inferoapical and apical septal defect. No ischemia. EF 47% (60-65% by 05/2018 Echo).   Chronic atrial fibrillation ([facility]   a. CHADS2VASc 4 (CHF, HTN, DM, vascular disease)--> Eliquis; b. 09/2018 Zio: Chronic Afib.   Chronic combined systolic and diastolic CHF (congestive heart failure) ([facility]   a.  7/18 Echo: EF 45-50%; b. 05/2018 Echo: EF 60-65%, RVSP 74.8; c. 12/2018 Echo: EF 50-55%. Sev dil LA.   Chronic venous stasis dermatitis of both lower extremities    Cytomegaloviral disease ([facility] [place]2017   Depression    Diabetes mellitus    Diverticulosis    Dyspnea    Erectile dysfunction    ESRD (end stage renal disease) ([facility]   a. HD 2004-2012; b. S/p renal transplant in 2012   GERD (gastroesophageal reflux disease)    Gout    Hyperlipidemia    low since renal  failure   Hypertension    Kidney transplant status, cadaveric 2012   Obesity    PAH (pulmonary artery hypertension) ([facility]   a. 05/2018 Echo: RVSP 74.38m24m; b. 12/2018 RV not well visualized.   Pneumonia    Purpura ([facility]  Sleep apnea    bipap with oxygen 2 l   Trigger finger    Ulcer 05/2016   Left shin   Wears glasses     Past Surgical History:  Procedure Laterality Date   BARIATRIC SURGERY  CARDIAC CATHETERIZATION     [facility]   CARDIAC CATHETERIZATION  05/03/2019   CATARACT EXTRACTION W/PHACO Right 10/29/2017   Procedure: CATARACT EXTRACTION PHACO AND INTRAOCULAR LENS PLACEMENT ([place])  RIGHT DIABETIC;  Surgeon: [name], MD;  Location: [place];  Service: Ophthalmology;  Laterality: Right;  Diabetic - insulin   CATARACT EXTRACTION W/PHACO Left 11/18/2017   Procedure: CATARACT EXTRACTION PHACO AND INTRAOCULAR LENS PLACEMENT ([place]) LEFT IVA/TOPICAL;  Surgeon: [name], MD;  Location: [facility];  Service: Ophthalmology;  Laterality: Left;  DIABETES - insulin sleep apnea   COLONOSCOPY WITH PROPOFOL N/A 04/22/2013   Procedure: COLONOSCOPY WITH PROPOFOL;  Surgeon: [name], MD;  Location: [facility];  Service: Endoscopy;  Laterality: N/A;   CORONARY ARTERY BYPASS GRAFT  2005   X 4   DG ANGIO AV SHUNT*L*     right and left upper arms   FASCIOTOMY  03/03/2012   Procedure: FASCIOTOMY;  Surgeon: [name], MD;  Location: [place];  Service: Orthopedics;  Laterality: Right;  FASCIOTOMY RIGHT SMALL FINGER   FASCIOTOMY Left 08/17/2013   Procedure: FASCIOTOMY LEFT RING;  Surgeon: [name], MD;  Location: [place];  Service: Orthopedics;  Laterality: Left;   INCISION AND DRAINAGE ABSCESS Left 10/15/2015   Procedure: INCISION AND DRAINAGE ABSCESS;  Surgeon: [name], MD;  Location: [facility];  Service: General;  Laterality: Left;   KIDNEY TRANSPLANT  09/13/2010   cadaver--at [facility] HEART  CATH N/A 11/15/2016   Procedure: RIGHT HEART CATH;  Surgeon: [name], MD;  Location: [place] [facility];  Service: Cardiovascular;  Laterality: N/A;   RIGHT HEART CATH N/A 05/03/2019   Procedure: RIGHT HEART CATH;  Surgeon: [name], MD;  Location: [place] [facility];  Service: Cardiovascular;  Laterality: N/A;   TYMPANIC MEMBRANE REPAIR  1/12   left   VASECTOMY      Family History  Problem Relation Age of Onset   Heart disease Father    Kidney failure Father    Kidney disease Father    Diabetes Maternal Grandmother    Breast cancer Maternal Grandmother    Valvular heart disease Mother    Liver cancer Paternal Uncle    Liver cancer Paternal Grandmother    Prostate cancer Neg Hx     Social History   Socioeconomic History   Marital status: Married    Spouse name: Not on file   Number of children: 2   Years of education: Not on file   Highest education level: Not on file  Occupational History   Occupation: Warden/ranger: unemployed    Comment: disabled due to kidney failure  Tobacco Use   Smoking status: Former    Types: Cigars    Quit date: 09/02/1994    Years since quitting: 26.1   Smokeless tobacco: Never  Vaping Use   Vaping Use: Never used  Substance and Sexual Activity   Alcohol use: Yes    Alcohol/week: 0.0 - 1.0 standard drinks    Comment: occasional- 3 in the past year   Drug use: No   Sexual activity: Not on file  Other Topics Concern   Not on file  Social History Narrative   In school for culinary arts      Has living will   Wife is health care POA   Would accept resuscitation attempts   Hasn't considered tube feedings   Social Determinants  of Health   Financial Resource Strain: Not on file  Food Insecurity: Not on file  Transportation Needs: Not on file  Physical Activity: Not on file  Stress: Not on file  Social Connections: Not on file  Intimate Partner Violence: Not on file   Review of Systems   Constitutional:  Negative for fatigue and unexpected weight change.       Wears seat belt  HENT:  Negative for dental problem.        Hearing aides Keeps up with dentist  Eyes:  Negative for visual disturbance.  Respiratory:  Negative for cough, chest tightness and shortness of breath.   Cardiovascular:  Positive for leg swelling. Negative for chest pain and palpitations.  Gastrointestinal:  Negative for blood in stool and constipation.       No heartburn  Endocrine: Negative for polydipsia and polyuria.  Genitourinary:        Stream is slow---does seem to empty okay mostly (dribbles)  Musculoskeletal:  Negative for arthralgias, back pain and joint swelling.  Skin:  Negative for rash.       Recent derm evaluation  Allergic/Immunologic: Positive for environmental allergies. Negative for immunocompromised state.  Neurological:  Negative for dizziness, syncope and light-headedness.  Psychiatric/Behavioral:  Negative for dysphoric mood and sleep disturbance. The patient is not nervous/anxious.       Objective:   Physical Exam Constitutional:      Appearance: Normal appearance.  HENT:     Mouth/Throat:     Pharynx: No oropharyngeal exudate or posterior oropharyngeal erythema.  Cardiovascular:     Rate and Rhythm: Normal rate. Rhythm irregular.     Pulses: Normal pulses.     Heart sounds: No murmur heard.   No gallop.  Pulmonary:     Effort: Pulmonary effort is normal.     Breath sounds: Normal breath sounds. No wheezing or rales.  Abdominal:     Palpations: Abdomen is soft.     Tenderness: There is no abdominal tenderness.     Comments: Moderate ventral hernia (discussed that I would not recommend any action other than weight loss, if possible)  Musculoskeletal:     Cervical back: Neck supple.     Comments: Slight non pitting calf edema  Lymphadenopathy:     Cervical: No cervical adenopathy.  Skin:    Comments: Stasis changes in calves  Neurological:     General: No focal  deficit present.     Mental Status: He is alert and oriented to person, place, and time.  Psychiatric:        Mood and Affect: Mood normal.        Behavior: Behavior normal.           Assessment & Plan:

## 2020-10-06 NOTE — Assessment & Plan Note (Signed)
Seems to be doing well on the bupropion

## 2020-10-06 NOTE — Assessment & Plan Note (Signed)
Weight up slightly but seems to be compensated

## 2020-10-06 NOTE — Assessment & Plan Note (Signed)
Rate si fine Continues on the apixaban

## 2020-10-06 NOTE — Assessment & Plan Note (Signed)
Doing okay given his myriad medical issues Colon due again 2028 Needs second COVID booster Flu vaccine in fall Continue gym regularly

## 2020-10-06 NOTE — Assessment & Plan Note (Signed)
Continues with Dr Gabriel Carina

## 2020-10-09 ENCOUNTER — Other Ambulatory Visit: Payer: Self-pay

## 2020-10-10 ENCOUNTER — Other Ambulatory Visit: Payer: Self-pay

## 2020-10-10 MED FILL — Tadalafil Tab 20 MG (PAH): ORAL | 30 days supply | Qty: 15 | Fill #3 | Status: AC

## 2020-10-12 ENCOUNTER — Other Ambulatory Visit: Payer: Self-pay

## 2020-10-13 ENCOUNTER — Other Ambulatory Visit: Payer: Self-pay

## 2020-10-13 ENCOUNTER — Telehealth: Payer: Self-pay | Admitting: Pulmonary Disease

## 2020-10-13 DIAGNOSIS — B360 Pityriasis versicolor: Secondary | ICD-10-CM | POA: Diagnosis not present

## 2020-10-13 MED ORDER — KETOCONAZOLE 2 % EX SHAM
MEDICATED_SHAMPOO | CUTANEOUS | 11 refills | Status: DC
  Filled 2020-10-13: qty 120, 30d supply, fill #0

## 2020-10-13 NOTE — Telephone Encounter (Signed)
Patient last seen 05/2018. Patient is requesting a order for cpap, as his current machine is broken.  He is aware that appt is needed prior to ordering machine.  Offered first available appt for [place]. Patient requested sooner appt then 11/17/2020 and he is willing to travel to [place].  OV scheduled for 10/17/2020 at 10:30 with Beth.  Nothing further needed at this time.

## 2020-10-13 NOTE — Telephone Encounter (Signed)
Lm for patient.   Patient last seen in 2020. Appt is needed prior to ordering new machine.

## 2020-10-17 ENCOUNTER — Other Ambulatory Visit: Payer: Self-pay

## 2020-10-17 ENCOUNTER — Encounter: Payer: Self-pay | Admitting: Primary Care

## 2020-10-17 ENCOUNTER — Ambulatory Visit (INDEPENDENT_AMBULATORY_CARE_PROVIDER_SITE_OTHER): Payer: 59 | Admitting: Primary Care

## 2020-10-17 VITALS — BP 138/80 | HR 63 | Ht 70.0 in | Wt 274.0 lb

## 2020-10-17 DIAGNOSIS — G4733 Obstructive sleep apnea (adult) (pediatric): Secondary | ICD-10-CM | POA: Diagnosis not present

## 2020-10-17 DIAGNOSIS — J45998 Other asthma: Secondary | ICD-10-CM | POA: Diagnosis not present

## 2020-10-17 MED ORDER — MONTELUKAST SODIUM 10 MG PO TABS
ORAL_TABLET | Freq: Every day | ORAL | 3 refills | Status: DC
  Filled 2020-10-17 – 2020-12-14 (×2): qty 90, 90d supply, fill #0
  Filled 2021-03-20: qty 90, 90d supply, fill #1
  Filled 2021-06-22: qty 90, 90d supply, fill #2

## 2020-10-17 MED ORDER — ALBUTEROL SULFATE HFA 108 (90 BASE) MCG/ACT IN AERS
2.0000 | INHALATION_SPRAY | Freq: Four times a day (QID) | RESPIRATORY_TRACT | 5 refills | Status: DC | PRN
  Filled 2020-10-17: qty 18, 25d supply, fill #0

## 2020-10-17 MED ORDER — BECLOMETHASONE DIPROP HFA 80 MCG/ACT IN AERB
2.0000 | INHALATION_SPRAY | Freq: Two times a day (BID) | RESPIRATORY_TRACT | 5 refills | Status: DC
  Filled 2020-10-17: qty 10.6, 30d supply, fill #0

## 2020-10-17 NOTE — Progress Notes (Signed)
_0  ID: Carl Harrington Jobe., male    DOB: 01-27-1961, 60 y.o.   MRN: [id number]  Chief Complaint  Patient presents with   Follow-up    Patient is here to see about new supplies and new machine.     Referring provider: [name], MD  HPI: 60 year old male, former smoker.  Past medical history significant for moderate asthma, obstructive sleep apnea, chronic diastolic heart failure, coronary artery disease, hypertension, A. fib, pulmonary hypertension due to left heart disease, type 2 diabetes.  Former patient of Dr. Ashby Dawes last seen in office on 06/15/2018.  PSG 03/15/2012 showed severe obstructive sleep apnea, AHI 57.9, SPO2 low 78%.  10/17/2020 Patient presents today for overdue follow-up. He has been consistently wearing BIPAP machine until it recently stopped working. He is needing an order for new machine. He states that he can not sleep without it. He gets on average 5-7 hours or sleep. Download shows that he is 83% compliant with use > 4 hours. Denies daytime somnolence or daytime fatigue.   His asthma symptoms typically flare when there are a lot of seasonal allergies. He has been using his inhalers incorrectly; he has been using Qvar as his rescue inhaler and Albuterol as his maintenance. We reviewed proper use today and he states understanding.   Airview download 06/06/20-07/05/20 Usage 28/30 days used; 83% > 4 hours Average usage days used 5 hours 14 ins Pressure IPAP 14/ EPAP 9 AHI 6.7    Allergies  Allergen Reactions   Contrast Media [Iodinated Diagnostic Agents] Other (See Comments)    Kidney transplant   Ibuprofen Other (See Comments)    Due to kidney transplant Due to kidney transplant Due to kidney transplant    Immunization History  Administered Date(s) Administered   Fluad Quad(high Dose 65+) 12/23/2018   Influenza Split 12/24/2010, 12/21/2012, 12/23/2013   Influenza, High Dose Seasonal PF 01/13/2015, 12/05/2015   Influenza,inj,Quad PF,6+  Mos 12/25/2016, 01/04/2020   Influenza-Unspecified 01/27/2018   Moderna Sars-Covid-2 Vaccination 06/09/2019, 09/25/2019, 10/26/2019   Pneumococcal Conjugate-13 01/03/2014   Pneumococcal Polysaccharide-23 12/23/2009, 12/25/2016   Tdap 11/12/2011   Zoster Recombinat (Shingrix) 10/05/2019, 01/04/2020    Past Medical History:  Diagnosis Date   Allergy    seasonal   Amnestic MCI (mild cognitive impairment with memory loss)    Asthma    Benign neoplasm of colon    CAD (coronary artery disease) 2005   a.  Status post four-vessel CABG in 2005; b. 03/2018 MV: Small, fixed inferoapical and apical septal defect. No ischemia. EF 47% (60-65% by 05/2018 Echo).   Chronic atrial fibrillation ([place])    a. CHADS2VASc 4 (CHF, HTN, DM, vascular disease)--> Eliquis; b. 09/2018 Zio: Chronic Afib.   Chronic combined systolic and diastolic CHF (congestive heart failure) ([place])    a.  7/18 Echo: EF 45-50%; b. 05/2018 Echo: EF 60-65%, RVSP 74.8; c. 12/2018 Echo: EF 50-55%. Sev dil LA.   Chronic venous stasis dermatitis of both lower extremities    Cytomegaloviral disease ([place]) 2017   Depression    Diabetes mellitus    Diverticulosis    Dyspnea    Erectile dysfunction    ESRD (end stage renal disease) ([place]) 2005   a. HD 2004-2012; b. S/p renal transplant in 2012   GERD (gastroesophageal reflux disease)    Gout    Hyperlipidemia    low since renal failure   Hypertension    Kidney transplant status, cadaveric 2012   Obesity    PAH (pulmonary artery  hypertension) ([place])    a. 05/2018 Echo: RVSP 74.68mHg; b. 12/2018 RV not well visualized.   Pneumonia    [place]    Sleep apnea    bipap with oxygen 2 l   Trigger finger    Ulcer 05/2016   Left shin   Wears glasses     Tobacco History: Social History   Tobacco Use  Smoking Status Former   Types: Cigars   Quit date: 09/02/1994   Years since quitting: 26.1  Smokeless Tobacco Never   Counseling given: Not Answered   Outpatient Medications  Prior to Visit  Medication Sig Dispense Refill   albuterol (PROVENTIL) (2.5 MG/3ML) 0.083% nebulizer solution Take 3 mLs (2.5 mg total) by nebulization every 6 (six) hours as needed for wheezing or shortness of breath. 150 mL 1   amoxicillin (AMOXIL) 500 MG capsule TAKE 4 CAPSULES (2,000 MG TOTAL) BY MOUTH 1 HOUR BEFORE DENTAL APPOINTMENT. 4 capsule 5   apixaban (ELIQUIS) 5 MG TABS tablet TAKE 1 TABLET BY MOUTH TWICE DAILY 180 tablet 1   benzonatate (TESSALON) 200 MG capsule Take 1 capsule (200 mg total) by mouth 3 (three) times daily as needed for cough. 40 capsule 1   buPROPion (WELLBUTRIN SR) 150 MG 12 hr tablet TAKE 1 TABLET BY MOUTH TWICE DAILY 180 tablet 3   cholecalciferol (VITAMIN D3) 25 MCG (1000 UNIT) tablet Take 1,000 Units by mouth daily.     colchicine 0.6 MG tablet Take 1 tablet (0.6 mg total) by mouth every other day. 30 tablet 1   Cyanocobalamin (B-12 PO) Take by mouth.     dapagliflozin propanediol (FARXIGA) 10 MG TABS tablet TAKE 1 TABLET (10 MG TOTAL) BY MOUTH DAILY BEFORE BREAKFAST. 30 tablet 6   Febuxostat 80 MG TABS Take 1 tablet (80 mg total) by mouth once daily for 180 days 90 tablet 1   FERREX 150 150 MG capsule Take 150 mg by mouth daily.      fluticasone (FLONASE) 50 MCG/ACT nasal spray PLACE 2 SPRAYS INTO BOTH NOSTRILS DAILY. 16 g 11   gabapentin (NEURONTIN) 300 MG capsule TAKE 1 CAPSULE BY MOUTH TWICE DAILY 180 capsule 3   HUMALOG KWIKPEN 100 UNIT/ML KwikPen Inject 5-10 Units into the skin 3 (three) times daily. Sliding scale     Insulin Glargine-yfgn 100 UNIT/ML SOPN Inject 40 Units subcutaneously once daily 15 mL 5   Insulin Pen Needle 31G X 5 MM MISC USE AS DIRECTED FOUR TIMES DAILY. 400 each 3   ketoconazole (NIZORAL) 2 % shampoo Lather onto skin and leave on for 5 minutes before rinsing once daily until clear, then once a week for maintenance 120 mL 11   metolazone (ZAROXOLYN) 2.5 MG tablet TAKE 2 TABLETS (5 MG) BY MOUTH ONCE A WEEK. 10 tablet 5   Multiple  Vitamin (MULTIVITAMIN WITH MINERALS) TABS tablet Take 1 tablet by mouth daily.     mycophenolate (MYFORTIC) 180 MG EC tablet Take 360 mg by mouth 2 (two) times daily.     omeprazole (PRILOSEC) 20 MG capsule TAKE 1 CAPSULE BY MOUTH DAILY. 90 capsule 3   Potassium Chloride CR (MICRO-K) 8 MEQ CPCR capsule CR Take 9 capsules (72 mEq total) by mouth 2 (two) times daily. 450 capsule 3   predniSONE (DELTASONE) 5 MG tablet Take 1 tablet (5 mg total) by mouth daily. 90 tablet 3   rosuvastatin (CRESTOR) 10 MG tablet TAKE 1 TABLET BY MOUTH DAILY 90 tablet 2   sulfamethoxazole-trimethoprim (BACTRIM) 400-80 MG tablet TAKE  1 TABLET BY MOUTH EVERY MONDAY, WEDNESDAY, FRIDAY. 36 tablet 3   tacrolimus (PROGRAF) 0.5 MG capsule TAKE 1 CAPSULE (0.5 MG TOTAL) BY MOUTH EVERY EVENING. (Patient taking differently: Take 0.5-1.5 mg by mouth 2 (two) times daily. 1.82m in am and 0.570min evenings.) 30 capsule 5   tadalafil, PAH, (ADCIRCA) 20 MG tablet TAKE 1 TABLET (20 MG TOTAL) BY MOUTH EVERY OTHER DAY. 60 tablet 1   tamsulosin (FLOMAX) 0.4 MG CAPS capsule TAKE 1 CAPSULE BY MOUTH DAILY. 90 capsule 3   torsemide (DEMADEX) 20 MG tablet TAKE 5 TABLETS (100 MG TOTAL) BY MOUTH 2 (TWO) TIMES DAILY. 300 tablet 4   vitamin C (ASCORBIC ACID) 250 MG tablet Take 1 tablet (250 mg total) by mouth daily. 30 tablet 0   zinc sulfate 220 (50 Zn) MG capsule Take 1 capsule (220 mg total) by mouth 2 (two) times daily. 60 capsule 0   albuterol (VENTOLIN HFA) 108 (90 Base) MCG/ACT inhaler Inhale 2 puffs into the lungs every 6 (six) hours as needed for wheezing or shortness of breath. 18 g 5   beclomethasone (QVAR) 80 MCG/ACT inhaler Inhale 2 puffs into the lungs 2 (two) times daily. 10.6 g 5   montelukast (SINGULAIR) 10 MG tablet TAKE 1 TABLET BY MOUTH AT BEDTIME. 90 tablet 3   insulin glargine (LANTUS) 100 UNIT/ML Solostar Pen INJECT 40 UNITS SUBCUTANEOUSLY NIGHTLY 15 mL 11   No facility-administered medications prior to visit.      Review  of Systems  Review of Systems  Constitutional: Negative.   HENT:  Positive for postnasal drip.   Respiratory:  Positive for cough.     Physical Exam  BP 138/80 (BP Location: Left Arm, Patient Position: Sitting, Cuff Size: Normal)   Pulse 63   Ht _0  (1.778 m)   Wt 274 lb (124.3 kg)   SpO2 93%   BMI 39.31 kg/m  Physical Exam Constitutional:      Appearance: Normal appearance. He is obese. He is not ill-appearing.  Cardiovascular:     Rate and Rhythm: Normal rate and regular rhythm.  Pulmonary:     Effort: Pulmonary effort is normal.     Breath sounds: Normal breath sounds. No wheezing, rhonchi or rales.  Musculoskeletal:        General: Normal range of motion.  Skin:    General: Skin is warm and dry.  Neurological:     General: No focal deficit present.     Mental Status: He is alert and oriented to person, place, and time. Mental status is at baseline.  Psychiatric:        Mood and Affect: Mood normal.        Behavior: Behavior normal.        Thought Content: Thought content normal.        Judgment: Judgment normal.     Lab Results:  CBC    Component Value Date/Time   WBC 4.1 07/24/2020 0526   RBC 4.61 07/24/2020 0526   HGB 11.1 (L) 07/24/2020 0526   HGB 13.0 06/10/2019 1019   HCT 36.3 (L) 07/24/2020 0526   HCT 41.6 06/10/2019 1019   PLT 124 (L) 07/24/2020 0526   PLT 154 06/10/2019 1019   MCV 78.7 (L) 07/24/2020 0526   MCV 78 (L) 06/10/2019 1019   MCV 83 06/16/2014 0911   MCH 24.1 (L) 07/24/2020 0526   MCHC 30.6 07/24/2020 0526   RDW 17.8 (H) 07/24/2020 0526   RDW 21.8 (H) 06/10/2019 1019  RDW 14.9 (H) 06/16/2014 0911   LYMPHSABS 1.1 07/22/2020 1459   LYMPHSABS 1.4 06/10/2019 1019   LYMPHSABS 0.5 (L) 06/16/2014 0911   MONOABS 0.7 07/22/2020 1459   MONOABS 0.5 06/16/2014 0911   EOSABS 0.1 07/22/2020 1459   EOSABS 0.0 06/10/2019 1019   EOSABS 0.1 06/16/2014 0911   BASOSABS 0.0 07/22/2020 1459   BASOSABS 0.0 06/10/2019 1019   BASOSABS 0.1  06/16/2014 0911    BMET    Component Value Date/Time   NA 140 07/24/2020 0526   NA 137 06/10/2019 1019   NA 141 06/16/2014 0911   K 3.5 07/24/2020 0526   K 3.4 (L) 06/16/2014 0911   CL 101 07/24/2020 0526   CL 104 06/16/2014 0911   CO2 31 07/24/2020 0526   CO2 30 06/16/2014 0911   GLUCOSE 161 (H) 07/24/2020 0526   GLUCOSE 139 (H) 06/16/2014 0911   BUN 53 (H) 07/24/2020 0526   BUN 48 (H) 06/10/2019 1019   BUN 13 06/16/2014 0911   CREATININE 1.84 (H) 07/24/2020 0526   CREATININE 0.83 06/16/2014 0911   CALCIUM 8.7 (L) 07/24/2020 0526   CALCIUM 9.1 06/16/2014 0911   GFRNONAA 41 (L) 07/24/2020 0526   GFRNONAA >60 06/16/2014 0911   GFRAA 52 (L) 11/15/2019 1209   GFRAA >60 06/16/2014 0911    BNP    Component Value Date/Time   BNP 246.0 (H) 06/01/2019 1130    ProBNP No results found for: PROBNP  Imaging: No results found.   Assessment & Plan:   Obstructive sleep apnea - Patient is 83% compliant with BIPAP use > 4 hours. His machine recently stopped working so he has been unable to use since April 2022. We will place an urgent order for a replacement BIPAP machine d/t severe OSA. His DME company is [employer]. Recommend FU in 3 months.   Asthma, persistent controlled - Asthma symptoms are currently well controlled; his symptoms exacerbate with seasonal allergies. He has been using his inhalers incorrectly. We reviewed proper use today. He should be taking Qvar two puffs q12 hours and Albuterol hfa 2 puffs every 6 hours as needed for breakthrough shortness of breath/wheezing. If his symptoms continue to flare with corrected regimen would consider ICS/LABA combo    Martyn Ehrich, NP 10/18/2020

## 2020-10-17 NOTE — Patient Instructions (Addendum)
Nice meeting you today [name] Try calling your DME company to see if they can give you a replacement BIPAP while you wait for a new machine   Recommendations: Take QVAR two puffs morning and evening  Use Albuterol (ventolin rescue inhaler) - two puffs every 6 hours for breakthrough shortness of breath/wheezing  Orders: DME order for new BIPAP machine/ priority d.t severe of OSA  Pressure IPAP 14/ EPAP 9  Follow-up: 3 months with [name] in [place] (new patient - sleep apnea/asthma)

## 2020-10-18 ENCOUNTER — Encounter: Payer: Self-pay | Admitting: Primary Care

## 2020-10-18 NOTE — Assessment & Plan Note (Signed)
-  Patient is 83% compliant with BIPAP use > 4 hours. His machine recently stopped working so he has been unable to use since April 2022. We will place an urgent order for a replacement BIPAP machine d/t severe OSA. His DME company is [employer]. Recommend FU in 3 months.

## 2020-10-18 NOTE — Assessment & Plan Note (Signed)
-  Asthma symptoms are currently well controlled; his symptoms exacerbate with seasonal allergies. He has been using his inhalers incorrectly. We reviewed proper use today. He should be taking Qvar two puffs q12 hours and Albuterol hfa 2 puffs every 6 hours as needed for breakthrough shortness of breath/wheezing. If his symptoms continue to flare with corrected regimen would consider ICS/LABA combo

## 2020-10-19 ENCOUNTER — Telehealth (HOSPITAL_COMMUNITY): Payer: Self-pay

## 2020-10-19 DIAGNOSIS — H35372 Puckering of macula, left eye: Secondary | ICD-10-CM | POA: Diagnosis not present

## 2020-10-19 NOTE — Telephone Encounter (Signed)
Attempted to contact, no answer.   Left message.    [place] [phone]

## 2020-10-20 ENCOUNTER — Other Ambulatory Visit (HOSPITAL_COMMUNITY): Payer: Self-pay

## 2020-10-20 ENCOUNTER — Other Ambulatory Visit: Payer: Self-pay

## 2020-10-20 MED ORDER — GABAPENTIN 300 MG PO CAPS
300.0000 mg | ORAL_CAPSULE | Freq: Two times a day (BID) | ORAL | 1 refills | Status: DC
  Filled 2020-10-20 – 2020-11-17 (×2): qty 180, 90d supply, fill #0
  Filled 2021-02-08: qty 180, 90d supply, fill #1

## 2020-10-20 MED FILL — Dapagliflozin Propanediol Tab 10 MG (Base Equivalent): ORAL | 30 days supply | Qty: 30 | Fill #3 | Status: AC

## 2020-10-20 MED FILL — Amoxicillin (Trihydrate) Cap 500 MG: ORAL | 1 days supply | Qty: 4 | Fill #2 | Status: AC

## 2020-10-23 ENCOUNTER — Other Ambulatory Visit: Payer: Self-pay

## 2020-10-23 ENCOUNTER — Other Ambulatory Visit: Payer: Self-pay | Admitting: Cardiovascular Disease

## 2020-10-23 ENCOUNTER — Telehealth: Payer: Self-pay | Admitting: Primary Care

## 2020-10-23 DIAGNOSIS — G4733 Obstructive sleep apnea (adult) (pediatric): Secondary | ICD-10-CM

## 2020-10-23 MED ORDER — METOLAZONE 2.5 MG PO TABS
ORAL_TABLET | ORAL | 0 refills | Status: DC
  Filled 2020-10-23: qty 10, fill #0

## 2020-10-23 MED ORDER — METOLAZONE 2.5 MG PO TABS
ORAL_TABLET | ORAL | 0 refills | Status: DC
  Filled 2020-10-23: qty 10, 30d supply, fill #0

## 2020-10-23 MED FILL — Torsemide Tab 20 MG: ORAL | 30 days supply | Qty: 300 | Fill #1 | Status: AC

## 2020-10-23 NOTE — Progress Notes (Signed)
[name] contacted me back.  [name] states doing horrible.  His cpap machine has broke and [name] is trying to get a new one.  They advised him could be 2 months because of none in stock.  [name] states can not sleep and feels horrible the next day.  Advised him to ask pulmonology if they have one to borrow or call HF clinic to see if they can help.  Explained importance the cpap can make on your whole body.  Advised him to contact me back if could not find out anything.  [name] has all his medications.  [name] states was doing well til cpap broke.  [name] tries to stay active at the gym.  [name] mows his his yard and does most of the cooking.  [name] watches high sodium foods.  [name] is aware of up coming appts.  Will continue to visit for heart failure.   [place] [phone]

## 2020-10-23 NOTE — Telephone Encounter (Signed)
[employer] is currently closed so we will need to call them in the morning. Under procedures tab of pt's chart, from 06/24/13, pt had a titration study done where he was transitioned to bipap during the study.  Will mark this message as high priority so we can call [employer] as soon as they open as this should've been marked red. Routing back to triage as high priority

## 2020-10-23 NOTE — Telephone Encounter (Signed)
Pt calling because [employer] is looking for titration information for bipap before they order it. Also, pt has not been able to sleep for 2 weeks, wanting to know if they([employer]?)  has loaners as pt's o2 goes down. Pt is very fatigued and has no energy. Oxygen level anywhere from 87%-92%, room air,not on oxygen.Please advise [phone]

## 2020-10-24 ENCOUNTER — Other Ambulatory Visit: Payer: Self-pay

## 2020-10-24 DIAGNOSIS — D649 Anemia, unspecified: Secondary | ICD-10-CM | POA: Diagnosis not present

## 2020-10-24 DIAGNOSIS — Z5181 Encounter for therapeutic drug level monitoring: Secondary | ICD-10-CM | POA: Diagnosis not present

## 2020-10-24 DIAGNOSIS — I482 Chronic atrial fibrillation, unspecified: Secondary | ICD-10-CM | POA: Diagnosis not present

## 2020-10-24 DIAGNOSIS — Z794 Long term (current) use of insulin: Secondary | ICD-10-CM | POA: Diagnosis not present

## 2020-10-24 DIAGNOSIS — G4733 Obstructive sleep apnea (adult) (pediatric): Secondary | ICD-10-CM | POA: Diagnosis not present

## 2020-10-24 DIAGNOSIS — I272 Pulmonary hypertension, unspecified: Secondary | ICD-10-CM | POA: Diagnosis not present

## 2020-10-24 DIAGNOSIS — Z940 Kidney transplant status: Secondary | ICD-10-CM | POA: Diagnosis not present

## 2020-10-24 DIAGNOSIS — Z4822 Encounter for aftercare following kidney transplant: Secondary | ICD-10-CM | POA: Diagnosis not present

## 2020-10-24 DIAGNOSIS — E1165 Type 2 diabetes mellitus with hyperglycemia: Secondary | ICD-10-CM | POA: Diagnosis not present

## 2020-10-24 DIAGNOSIS — D849 Immunodeficiency, unspecified: Secondary | ICD-10-CM | POA: Diagnosis not present

## 2020-10-24 DIAGNOSIS — Z23 Encounter for immunization: Secondary | ICD-10-CM | POA: Diagnosis not present

## 2020-10-24 DIAGNOSIS — Z79899 Other long term (current) drug therapy: Secondary | ICD-10-CM | POA: Diagnosis not present

## 2020-10-24 DIAGNOSIS — R600 Localized edema: Secondary | ICD-10-CM | POA: Diagnosis not present

## 2020-10-24 MED FILL — Tacrolimus Cap 0.5 MG: ORAL | 30 days supply | Qty: 30 | Fill #3 | Status: AC

## 2020-10-24 NOTE — Telephone Encounter (Signed)
Called and spoke with Melissa from [place] about the call received from pt yesterday 8/1. Stated to [facility] back on 06/24/13, pt did have a titration study performed which shows that pt was transitioned from CPAP to BIPAP.  Stated to her the other info found out from pt.  Melissa wanted to know if pt was still needing to wear O2 with the BIPAP as she said if he does, this would help move him up on the list to receive a new machine. Melissa also wanted to know if we wanted pt to have an ONO performed on pt with him wearing the BIPAP to see what those results are.  Due to [facility] not being at the office, I am going to route to Dr. Annamaria Boots for advice. Dr. Annamaria Boots, please advise if  you can help [name] out with this as pt does have an upcoming appt scheduled with you.

## 2020-10-24 NOTE — Telephone Encounter (Signed)
Order has been placed for pt to have the ONO performed on room air with him using the BIPAP so we can see if that shows that pt should continue to wear O2 at night with the BIPAP.  Called [employer] and spoke with Melissa and updated her on this and she verbalized understanding.  Tried to call pt to further discuss all with him but unable to reach. Left message for him to return call.

## 2020-10-24 NOTE — Telephone Encounter (Signed)
Patient has pulmonary hypertension so he probably should continue his O2 during sleep through his BIPAP machine. We can reinforce that by ordering overnight oximetry on BIPAP with room air.

## 2020-10-24 NOTE — Telephone Encounter (Signed)
Pt returned call. I updated pt on the info from [employer] as well as [place] and let him know that we will get the ONO done on room air with him wearing the BIPAP to see what that shows and go from there. Pt verbalized understanding. Nothing further needed.

## 2020-10-25 ENCOUNTER — Other Ambulatory Visit: Payer: Self-pay

## 2020-10-25 DIAGNOSIS — E669 Obesity, unspecified: Secondary | ICD-10-CM | POA: Diagnosis not present

## 2020-10-25 DIAGNOSIS — N1831 Chronic kidney disease, stage 3a: Secondary | ICD-10-CM | POA: Diagnosis not present

## 2020-10-25 DIAGNOSIS — E1142 Type 2 diabetes mellitus with diabetic polyneuropathy: Secondary | ICD-10-CM | POA: Diagnosis not present

## 2020-10-25 DIAGNOSIS — I10 Essential (primary) hypertension: Secondary | ICD-10-CM | POA: Diagnosis not present

## 2020-10-25 DIAGNOSIS — Z794 Long term (current) use of insulin: Secondary | ICD-10-CM | POA: Diagnosis not present

## 2020-10-25 DIAGNOSIS — E113293 Type 2 diabetes mellitus with mild nonproliferative diabetic retinopathy without macular edema, bilateral: Secondary | ICD-10-CM | POA: Diagnosis not present

## 2020-10-25 DIAGNOSIS — E1169 Type 2 diabetes mellitus with other specified complication: Secondary | ICD-10-CM | POA: Diagnosis not present

## 2020-10-25 DIAGNOSIS — E1122 Type 2 diabetes mellitus with diabetic chronic kidney disease: Secondary | ICD-10-CM | POA: Diagnosis not present

## 2020-10-25 DIAGNOSIS — Z7952 Long term (current) use of systemic steroids: Secondary | ICD-10-CM | POA: Diagnosis not present

## 2020-10-25 DIAGNOSIS — G4733 Obstructive sleep apnea (adult) (pediatric): Secondary | ICD-10-CM | POA: Diagnosis not present

## 2020-10-25 DIAGNOSIS — E662 Morbid (severe) obesity with alveolar hypoventilation: Secondary | ICD-10-CM | POA: Diagnosis not present

## 2020-10-26 ENCOUNTER — Telehealth: Payer: Self-pay | Admitting: Pulmonary Disease

## 2020-10-26 ENCOUNTER — Telehealth (HOSPITAL_COMMUNITY): Payer: Self-pay | Admitting: *Deleted

## 2020-10-26 ENCOUNTER — Encounter (HOSPITAL_COMMUNITY): Payer: Self-pay

## 2020-10-26 ENCOUNTER — Other Ambulatory Visit (HOSPITAL_COMMUNITY): Payer: Self-pay

## 2020-10-26 NOTE — Progress Notes (Signed)
Today Iniko has edema in legs, shortness of breath, decreased energy.  Has a dry cough.  His bipap machine has broke and trying to use someone cpap to make up for it.  He has spoke to pumonology about getting a new one.  He states has a fitting test on the 18th this month, hopefully will have a new machine then.  He is not sleeping, feels bad all the time since his broke.  He started retaining fluids starting in his legs a week ago.  He is up 8 lbs.  Spoke with Nira Conn with triage at [facility] office to see about giving some IV lasix.  She contacted me back to give 80 mg lasix and made him appt for Monday.  When contacted Asif back to tell him about lasix he stated he got desperate and took 5 to 6 torsemide and a metolazone since we spoke.  Contacted Jasmine and she advised [place] and he stated not to give the lasix.  Contacted Delma back and advised and advised him to rest, watch fluids and high sodium foods.  To elevate his legs and reminded him of appt on Monday.  He states will do those things.    [place] [phone]

## 2020-10-26 NOTE — Telephone Encounter (Signed)
Pt returning a phone call. Pt can be reached at [phone]

## 2020-10-26 NOTE — Telephone Encounter (Signed)
[name] called to report that pt has been retaining fluid, his wt is up and he is more SOB. Pt stated he felt like he needed IV lasix. Pt saw nephrology 8/2, labs stable bun/cr=31/1.6.  Discussed all with Dr Aundra Dubin, he advised pt can get a dose of 42m IV Lasix today to replace evening dose of Torsemide and needs a f/u appt with our office early next week.  [name] is aware and will give IV Lasix today, appt sch for Mon 8/8

## 2020-10-26 NOTE — Telephone Encounter (Signed)
Spoke with patient who states he is calling because patients cpap machine broke and he is using someone else's and needs to get the settings adjusted to the current settings from his broken machine. Asked patient if he still uses [employer] for his DME he said yes, asked him if he has reached out to them for them to get it set up and he said no. Provided him with number for [employer] [phone] and asked him to call them and see if they are able to help him. Advised him if he needed anything else or had any issues to call and let us know. He expressed understanding. Nothing further needed at this time.

## 2020-10-26 NOTE — Telephone Encounter (Signed)
LMTCB

## 2020-10-26 NOTE — Telephone Encounter (Signed)
[facility] Patient.  LM to call back. 

## 2020-10-28 DIAGNOSIS — E1165 Type 2 diabetes mellitus with hyperglycemia: Secondary | ICD-10-CM | POA: Diagnosis not present

## 2020-10-28 NOTE — Progress Notes (Signed)
[facility] Note   Date:  10/30/2020   ID:  [name]., DOB July 12, 1960, MRN [id number]   Provider location: [name] [place] Type of Visit:  Established patient  PCP:  [name], MD  Cardiologist:  [name] Primary HF: [name] Nephrology: Dr Ronnald Ramp    History of Present Illness: [name]. is a 60 y.o. male  with a history of CAD s/p CABG 2005, chronic atrial fibrillation on Eliquis, ESRD s/p renal transplant 2012, anc chronic diastolic CHF.  His renal function has been fairly stable, most recent creatinine 1.43.  He has been struggling with volume overload/CHF.  He has a history of ischemic cardiomyopathy with EF as low as 35-40% in the past but echo in 3/20 showed EF 60-65%.  There is evidence for RV failure with pulmonary hypertension by echo.    Admitted 11/05/18 low grade fever and shortness of breath. HS Trop negative. Covid 19 negative. Blood CX negative. Placed on antibiotics and discharged to home the next day.   Admitted 4/0/81 with A/C Diastolic HF. Diuresed with IV lasix and transitioned to torsemide 80/40 mg . Discharge weight 252 pounds.   Admitted 06/2019 with left arm pain. Left upper extremity venous Doppler revealed occluded cephalic vein at the AV fistula outflow tract in the antecubital fossa with no other thrombus identified. Vascular evaluated. He continued on eliquis.  No plan for intervention. Also treated for acute gout flare. Discharged on prednisone taper.    At last appointment, BP-active meds cut back due to orthostatic hypotension and worsening renal function.   Given IV lasix on 12/6 by HF Paramedicine.   Seen 1/22 for HF follow up. Using Bipap every night. Weight at home 260-264  pounds. Taking all medications. NYHA II symptoms, not volume overloaded. Continued on torsemide 100 mg bid + metolazone 2x/week.  Admitted to [facility] 2/22 with acute gout flare vs cellulitis. Nephrology  was consulted to evaluate transplant regimen and follow along. He was treated with antibiotics and prednisone. Discharged on decreased torsemide dose of 40 mg daily. Weight 259 lbs.  Admitted 4/22 for left leg cellulitis after sustaining a dog scratch, received IV antibiotics. No changes to his diuretic therapy. Discharge weight 266 lbs.  Today he returns for acute visit for increased SOB and fluid being elevated. Last seen in clinic 1/22. Started getting SOB and swelling last week, panicked and took 5-6 torsemide tablets and metolazone, subsequently loss about 12 lbs. Ran out of torsemide about 2 weeks ago, so he has been taking 2 tablets daily instead of 5 tablets twice a day. Now, just SOB on inclines and steep hills. Denies CP, dizziness, or PND/Orthopnea. Appetite ok. No fever or chills. Weight at home 254 pounds. Taking all medications.   ECG (personally reviewed): atrial fibrillation 60 bpm  ReDs Clip: 31%  Labs (7/19): LDL 49 Labs (2/20): K 3.7, creatinine 1.43 Labs (4/20): K 4.3, creatinine 1.24 Labs (11/06/18): K 3.7 creatinine 1.23  Labs (05/07/19): K 3.8 Creatinine 1.5  Labs (07/18/19): K 4.3 Creatinine 1.42  Labs (5/21): K 3.3, creatinine 1.21 Labs (11/21): K 3.6, creatinine 1.4 Labs (12/21): K 3.3, creatinine 2.4, hgb 12.8 Labs (04/01/19): K 3.3 Creatinine 2.2  Labs (8/22): K 4.0, creatinine 1.6, HDL 35, LDL 36, a1c 7.5,  PMH: 1. CAD: s/p CABG x 4 in [place] in 2005.  - Cardiolite (1/20): EF 47%, small fixed apical septal defect, no ischemia.  Low risk study.  2. Asthma 3. Gout 4. Atrial fibrillation: Chronic.  5. ESRD s/p renal transplantation at [facility] in 2012.  6. Anemia of chronic disease 7. Type II diabetes 8. HTN 9. Hyperlipidemia 10. OSA: On bipap.  11. Ischemic cardiomyopathy: EF 35-40% in past.  - Cardiolite (1/20) with EF 47%.  - Echo (1/20): EF 50-55%, mild MR, RV moderately dilated with moderately decreased systolic function, PASP 73 mmHg, moderate TR.   -  Echo (3/20): EF 60-65%, PASP 75 mmHg, mildly dilated RV with mildly decreased systolic function.  - Echo (2/21): EF 45-50%, global HK, moderately dilated RV with moderately decreased systolic function, D-shaped interventricular septum, dilated IVC.  - RHC (2/21): mean RA 19, PA 78/31 mean 51, mean PCWP 26, CI 3.26, PVR 3.25 12. Obesity: s/p gastric bypass in 2015.  13. Bradycardia with beta blocker.   Past Surgical History:  Procedure Laterality Date   BARIATRIC SURGERY     CARDIAC CATHETERIZATION     [facility]   CARDIAC CATHETERIZATION  05/03/2019   CATARACT EXTRACTION W/PHACO Right 10/29/2017   Procedure: CATARACT EXTRACTION PHACO AND INTRAOCULAR LENS PLACEMENT ([place])  RIGHT DIABETIC;  Surgeon: [name], MD;  Location: [place];  Service: Ophthalmology;  Laterality: Right;  Diabetic - insulin   CATARACT EXTRACTION W/PHACO Left 11/18/2017   Procedure: CATARACT EXTRACTION PHACO AND INTRAOCULAR LENS PLACEMENT ([place]) LEFT IVA/TOPICAL;  Surgeon: [name], MD;  Location: [place];  Service: Ophthalmology;  Laterality: Left;  DIABETES - insulin sleep apnea   COLONOSCOPY WITH PROPOFOL N/A 04/22/2013   Procedure: COLONOSCOPY WITH PROPOFOL;  Surgeon: [name], MD;  Location: [facility];  Service: Endoscopy;  Laterality: N/A;   CORONARY ARTERY BYPASS GRAFT  2005   X 4   DG ANGIO AV SHUNT*L*     right and left upper arms   FASCIOTOMY  03/03/2012   Procedure: FASCIOTOMY;  Surgeon: [name], MD;  Location: [place];  Service: Orthopedics;  Laterality: Right;  FASCIOTOMY RIGHT SMALL FINGER   FASCIOTOMY Left 08/17/2013   Procedure: FASCIOTOMY LEFT RING;  Surgeon: [name], MD;  Location: [place];  Service: Orthopedics;  Laterality: Left;   INCISION AND DRAINAGE ABSCESS Left 10/15/2015   Procedure: INCISION AND DRAINAGE ABSCESS;  Surgeon: [name], MD;  Location: [facility];  Service: General;  Laterality: Left;    KIDNEY TRANSPLANT  09/13/2010   cadaver--at [facility] HEART CATH N/A 11/15/2016   Procedure: RIGHT HEART CATH;  Surgeon: [name], MD;  Location: [place] CV LAB;  Service: Cardiovascular;  Laterality: N/A;   RIGHT HEART CATH N/A 05/03/2019   Procedure: RIGHT HEART CATH;  Surgeon: [name], MD;  Location: [place] CV LAB;  Service: Cardiovascular;  Laterality: N/A;   TYMPANIC MEMBRANE REPAIR  1/12   left   VASECTOMY       Current Outpatient Medications  Medication Sig Dispense Refill   albuterol (PROVENTIL) (2.5 MG/3ML) 0.083% nebulizer solution Take 3 mLs (2.5 mg total) by nebulization every 6 (six) hours as needed for wheezing or shortness of breath. 150 mL 1   albuterol (VENTOLIN HFA) 108 (90 Base) MCG/ACT inhaler Inhale 2 puffs into the lungs every 6 (six) hours as needed for wheezing or shortness of breath. 18 g 5   apixaban (ELIQUIS) 5 MG TABS tablet TAKE 1 TABLET BY MOUTH TWICE DAILY 180 tablet 1   beclomethasone (QVAR) 80 MCG/ACT inhaler Inhale 2 puffs into the lungs 2 (  two) times daily. 10.6 g 5   benzonatate (TESSALON) 200 MG capsule Take 1 capsule (200 mg total) by mouth 3 (three) times daily as needed for cough. 40 capsule 1   buPROPion (WELLBUTRIN SR) 150 MG 12 hr tablet TAKE 1 TABLET BY MOUTH TWICE DAILY 180 tablet 3   cholecalciferol (VITAMIN D3) 25 MCG (1000 UNIT) tablet Take 1,000 Units by mouth daily.     Cyanocobalamin (B-12 PO) Take by mouth.     dapagliflozin propanediol (FARXIGA) 10 MG TABS tablet TAKE 1 TABLET (10 MG TOTAL) BY MOUTH DAILY BEFORE BREAKFAST. 30 tablet 6   Febuxostat 80 MG TABS Take 1 tablet (80 mg total) by mouth once daily for 180 days 90 tablet 1   FERREX 150 150 MG capsule Take 150 mg by mouth daily.      fluticasone (FLONASE) 50 MCG/ACT nasal spray PLACE 2 SPRAYS INTO BOTH NOSTRILS DAILY. 16 g 11   gabapentin (NEURONTIN) 300 MG capsule TAKE 1 CAPSULE BY MOUTH TWICE DAILY 180 capsule 1   HUMALOG KWIKPEN 100 UNIT/ML KwikPen  Inject 5-10 Units into the skin 3 (three) times daily. Sliding scale     Insulin Glargine-yfgn 100 UNIT/ML SOPN Inject 40 Units subcutaneously once daily 15 mL 5   Insulin Pen Needle 31G X 5 MM MISC USE AS DIRECTED FOUR TIMES DAILY. 400 each 3   ketoconazole (NIZORAL) 2 % shampoo Lather onto skin and leave on for 5 minutes before rinsing once daily until clear, then once a week for maintenance 120 mL 11   metolazone (ZAROXOLYN) 2.5 MG tablet Take 1 tablet by mouth 2 days per week 10 tablet 0   montelukast (SINGULAIR) 10 MG tablet TAKE 1 TABLET BY MOUTH AT BEDTIME. 90 tablet 3   Multiple Vitamin (MULTIVITAMIN WITH MINERALS) TABS tablet Take 1 tablet by mouth daily.     mycophenolate (MYFORTIC) 180 MG EC tablet Take 360 mg by mouth 2 (two) times daily.     omeprazole (PRILOSEC) 20 MG capsule TAKE 1 CAPSULE BY MOUTH DAILY. 90 capsule 3   Potassium Chloride CR (MICRO-K) 8 MEQ CPCR capsule CR Take 9 capsules (72 mEq total) by mouth 2 (two) times daily. 450 capsule 3   predniSONE (DELTASONE) 5 MG tablet Take 1 tablet (5 mg total) by mouth daily. 90 tablet 3   rosuvastatin (CRESTOR) 10 MG tablet TAKE 1 TABLET BY MOUTH DAILY 90 tablet 2   sulfamethoxazole-trimethoprim (BACTRIM) 400-80 MG tablet TAKE 1 TABLET BY MOUTH EVERY MONDAY, WEDNESDAY, FRIDAY. 36 tablet 3   tacrolimus (PROGRAF) 0.5 MG capsule TAKE 1 CAPSULE (0.5 MG TOTAL) BY MOUTH EVERY EVENING. (Patient taking differently: Take 0.5-1.5 mg by mouth 2 (two) times daily. 1.75m in am and 0.571min evenings.) 30 capsule 5   tadalafil, PAH, (ADCIRCA) 20 MG tablet TAKE 1 TABLET (20 MG TOTAL) BY MOUTH EVERY OTHER DAY. 60 tablet 1   tamsulosin (FLOMAX) 0.4 MG CAPS capsule TAKE 1 CAPSULE BY MOUTH DAILY. 90 capsule 3   torsemide (DEMADEX) 20 MG tablet TAKE 5 TABLETS (100 MG TOTAL) BY MOUTH 2 (TWO) TIMES DAILY. (Patient taking differently: Take 20 mg by mouth 2 (two) times daily. He has been taking 2-3 twice daily) 300 tablet 4   vitamin C (ASCORBIC ACID) 250 MG  tablet Take 1 tablet (250 mg total) by mouth daily. 30 tablet 0   zinc sulfate 220 (50 Zn) MG capsule Take 220 mg by mouth daily.     No current facility-administered medications for this encounter.  Allergies:   Contrast media [iodinated diagnostic agents] and Ibuprofen   Social History:  The patient  reports that he quit smoking about 26 years ago. His smoking use included cigars. He has never used smokeless tobacco. He reports current alcohol use. He reports that he does not use drugs.   Family History:  The patient's family history includes Breast cancer in his maternal grandmother; Diabetes in his maternal grandmother; Heart disease in his father; Kidney disease in his father; Kidney failure in his father; Liver cancer in his paternal grandmother and paternal uncle; Valvular heart disease in his mother.   ROS:  Please see the history of present illness.   All other systems are personally reviewed and negative.  There were no vitals filed for this visit.  Wt Readings from Last 3 Encounters:  10/26/20 127.9 kg (282 lb)  10/17/20 124.3 kg (274 lb)  10/06/20 121.6 kg (268 lb)   Exam:   General:  NAD. No resp difficulty HEENT: Normal Neck: Supple. JVP 6-7. Carotids 2+ bilat; no bruits. No lymphadenopathy or thryomegaly appreciated. Cor: PMI nondisplaced. Irregular rate & rhythm. No rubs, gallops or murmurs. Lungs: Clear Abdomen: Obese,  nontender, nondistended. No hepatosplenomegaly. No bruits or masses. Good bowel sounds. Extremities: No cyanosis, clubbing, rash, edema; chronic venous stasis changes to LE Neuro: Alert & oriented x 3, cranial nerves grossly intact. Moves all 4 extremities w/o difficulty. Affect pleasant.  Recent Labs: 07/22/2020: ALT 14 07/24/2020: BUN 53; Creatinine, Ser 1.84; Hemoglobin 11.1; Magnesium 1.9; Platelets 124; Potassium 3.5; Sodium 140  Personally reviewed   Wt Readings from Last 3 Encounters:  10/26/20 127.9 kg (282 lb)  10/17/20 124.3 kg (274 lb)   10/06/20 121.6 kg (268 lb)    ASSESSMENT AND PLAN:  1. Heart failure with mid range EF: With prominent RV failure/pulmonary hypertension. ?If RV failure is related to severe OSA, he was a remote smoker.  Echo in 2/21 with EF 45-50%, moderately dilated RV with moderately decreased systolic function, moderate pulmonary hypertension. NYHA class II today. Volume status stable. - Continue torsemide 100 mg bid. (Will change to 100 mg tablets and instructed to only take 1 twice a day). - Continue metolazone twice a week on Tuesday and Sunday.  - Continue dapagliflozin 10 mg daily.  - He will stay off lisinopril and spironolactone with rise in creatinine.  - Unable to confirm that his insurance will cover Cardiomems so will manage him without this.   2. Atrial fibrillation: Chronic. Rate controlled.  - He will continue Eliquis. No bleeding issues. - Not on beta blocker with history of bradycardia.   3. CAD: S/p CABG 2005.  Low risk Cardiolite in 1/20.   - No chest pain.  - No ASA given Eliquis use.  - Continue Crestor 10 mg daily, lipids checked 8/22. 4. CKD: Stage 3. S/p renal transplantation in 2012. He remains on mycophenolate and tacrolimus.   - BMET today. 5. OSA: Continue Bipap every night.  6. HTN: Elevated today. He has been off lisinopril and spironolactone.  - I have asked him to check his BP at home and bring log at next visit. May need to add back amlodipine if elevated. 7. DM 2: a1c 7.7 (8/22). On dapagliflozin. 8. Pulmonary hypertension: Severe pulmonary hypertension by 3/20 echo.  RHC in 2/21 with moderate, primarily pulmonary venous hypertension (PVR 3.25 WU). Suspect that there is also a component of group 3 PH (OSA, ? unrecognized OHS).  9. Gout: No further flares. He uses colchicine. I  think prednisone contributes to volume overload.     Reds Clip stable. Follow up in 3 weeks. Check BMET today. Discussed low salt food choices and limiting fluid intake to < 2 liters per day.  Appreciated paramedicine's assistance.  Signed, Rafael Bihari, FNP  10/30/2020  [facility] [address] [facility] [place] [address] [phone] (office) [phone] (fax)

## 2020-10-30 ENCOUNTER — Other Ambulatory Visit: Payer: Self-pay

## 2020-10-30 ENCOUNTER — Encounter (HOSPITAL_COMMUNITY): Payer: Self-pay

## 2020-10-30 ENCOUNTER — Ambulatory Visit (HOSPITAL_COMMUNITY)
Admission: RE | Admit: 2020-10-30 | Discharge: 2020-10-30 | Disposition: A | Discharge status: Home / Self Care | Payer: 59 | Source: Ambulatory Visit | Attending: Family Medicine | Admitting: Family Medicine

## 2020-10-30 VITALS — BP 164/70 | HR 59 | Wt 265.6 lb

## 2020-10-30 DIAGNOSIS — Z951 Presence of aortocoronary bypass graft: Secondary | ICD-10-CM | POA: Insufficient documentation

## 2020-10-30 DIAGNOSIS — E1121 Type 2 diabetes mellitus with diabetic nephropathy: Secondary | ICD-10-CM | POA: Diagnosis not present

## 2020-10-30 DIAGNOSIS — G4733 Obstructive sleep apnea (adult) (pediatric): Secondary | ICD-10-CM

## 2020-10-30 DIAGNOSIS — M109 Gout, unspecified: Secondary | ICD-10-CM | POA: Insufficient documentation

## 2020-10-30 DIAGNOSIS — Z961 Presence of intraocular lens: Secondary | ICD-10-CM | POA: Insufficient documentation

## 2020-10-30 DIAGNOSIS — M1A9XX0 Chronic gout, unspecified, without tophus (tophi): Secondary | ICD-10-CM | POA: Diagnosis not present

## 2020-10-30 DIAGNOSIS — I132 Hypertensive heart and chronic kidney disease with heart failure and with stage 5 chronic kidney disease, or end stage renal disease: Secondary | ICD-10-CM | POA: Insufficient documentation

## 2020-10-30 DIAGNOSIS — I10 Essential (primary) hypertension: Secondary | ICD-10-CM | POA: Diagnosis not present

## 2020-10-30 DIAGNOSIS — Z940 Kidney transplant status: Secondary | ICD-10-CM | POA: Insufficient documentation

## 2020-10-30 DIAGNOSIS — Z886 Allergy status to analgesic agent status: Secondary | ICD-10-CM | POA: Diagnosis not present

## 2020-10-30 DIAGNOSIS — I5020 Unspecified systolic (congestive) heart failure: Secondary | ICD-10-CM | POA: Diagnosis not present

## 2020-10-30 DIAGNOSIS — I2722 Pulmonary hypertension due to left heart disease: Secondary | ICD-10-CM

## 2020-10-30 DIAGNOSIS — N183 Chronic kidney disease, stage 3 unspecified: Secondary | ICD-10-CM

## 2020-10-30 DIAGNOSIS — I272 Pulmonary hypertension, unspecified: Secondary | ICD-10-CM | POA: Diagnosis not present

## 2020-10-30 DIAGNOSIS — E1122 Type 2 diabetes mellitus with diabetic chronic kidney disease: Secondary | ICD-10-CM | POA: Insufficient documentation

## 2020-10-30 DIAGNOSIS — I482 Chronic atrial fibrillation, unspecified: Secondary | ICD-10-CM | POA: Diagnosis not present

## 2020-10-30 DIAGNOSIS — Z87891 Personal history of nicotine dependence: Secondary | ICD-10-CM | POA: Insufficient documentation

## 2020-10-30 DIAGNOSIS — Z79899 Other long term (current) drug therapy: Secondary | ICD-10-CM | POA: Insufficient documentation

## 2020-10-30 DIAGNOSIS — Z7951 Long term (current) use of inhaled steroids: Secondary | ICD-10-CM | POA: Diagnosis not present

## 2020-10-30 DIAGNOSIS — I5032 Chronic diastolic (congestive) heart failure: Secondary | ICD-10-CM

## 2020-10-30 DIAGNOSIS — Z8249 Family history of ischemic heart disease and other diseases of the circulatory system: Secondary | ICD-10-CM | POA: Insufficient documentation

## 2020-10-30 DIAGNOSIS — I2510 Atherosclerotic heart disease of native coronary artery without angina pectoris: Secondary | ICD-10-CM

## 2020-10-30 DIAGNOSIS — Z794 Long term (current) use of insulin: Secondary | ICD-10-CM

## 2020-10-30 DIAGNOSIS — Z7901 Long term (current) use of anticoagulants: Secondary | ICD-10-CM | POA: Insufficient documentation

## 2020-10-30 LAB — BASIC METABOLIC PANEL
Anion gap: 9 (ref 5–15)
BUN: 42 mg/dL — ABNORMAL HIGH (ref 8–23)
CO2: 36 mmol/L — ABNORMAL HIGH (ref 22–32)
Calcium: 9.1 mg/dL (ref 8.9–10.3)
Chloride: 95 mmol/L — ABNORMAL LOW (ref 98–111)
Creatinine, Ser: 1.64 mg/dL — ABNORMAL HIGH (ref 0.61–1.24)
GFR, Estimated: 47 mL/min — ABNORMAL LOW (ref >60)
Glucose, Bld: 172 mg/dL — ABNORMAL HIGH (ref 70–99)
Potassium: 3.3 mmol/L — ABNORMAL LOW (ref 3.5–5.1)
Sodium: 140 mmol/L (ref 135–145)

## 2020-10-30 LAB — BRAIN NATRIURETIC PEPTIDE: B Natriuretic Peptide: 171.2 pg/mL — ABNORMAL HIGH (ref 0.0–100.0)

## 2020-10-30 MED ORDER — TORSEMIDE 100 MG PO TABS
100.0000 mg | ORAL_TABLET | Freq: Two times a day (BID) | ORAL | 4 refills | Status: DC
  Filled 2020-10-30: qty 60, 30d supply, fill #0
  Filled 2020-12-06: qty 180, 90d supply, fill #1
  Filled 2021-04-30: qty 60, 30d supply, fill #2

## 2020-10-30 NOTE — Patient Instructions (Signed)
CHANGE Torsemide to 100 mg tablets, take one tablet twice a day  Labs today We will only contact you if something comes back abnormal or we need to make some changes. Otherwise no news is good news!  Your physician recommends that you schedule a follow-up appointment in: 3-4 weeks  in the Advanced Practitioners (PA/NP) Clinic    Do the following things EVERYDAY: Weigh yourself in the morning before breakfast. Write it down and keep it in a log. Take your medicines as prescribed Eat low salt foods--Limit salt (sodium) to 2000 mg per day.  Stay as active as you can everyday Limit all fluids for the day to less than 2 liters  milAt the [facility], you and your health needs are our priority. As part of our continuing mission to provide you with exceptional heart care, we have created designated Provider Care Teams. These Care Teams include your primary Cardiologist (physician) and Advanced Practice Providers (APPs- Physician Assistants and Nurse Practitioners) who all work together to provide you with the care you need, when you need it.   You may see any of the following providers on your designated Care Team at your next follow up: Dr Glori Bickers Dr Loralie Champagne Dr Patrice Paradise, NP Lyda Jester, [place] Ginnie Smart Audry Riles, PharmD   Please be sure to bring in all your medications bottles to every appointment.

## 2020-10-30 NOTE — Progress Notes (Signed)
ReDS Vest / Clip - 10/30/20 1100       ReDS Vest / Clip   Station Marker C    Ruler Value 31    ReDS Value Range Low volume    ReDS Actual Value 31

## 2020-11-01 ENCOUNTER — Telehealth (HOSPITAL_COMMUNITY): Payer: Self-pay | Admitting: Cardiology

## 2020-11-01 ENCOUNTER — Telehealth: Payer: Self-pay | Admitting: Primary Care

## 2020-11-01 NOTE — Telephone Encounter (Signed)
Received a fax from [facility] that Athelstan is not covered under this patients insurance. The preferred are Arnuity Ellipta and Flovent. Please advise if ok to change? Thanks.

## 2020-11-01 NOTE — Telephone Encounter (Signed)
Attempted to call pt to let him know about his inhalers with switching him from Qvar to Flovent due to Qvar not being covered by insurance.  Unable to reach pt. Left message for him to return call. I have pended the Flovent Rx that we will send once spoken to pt.

## 2020-11-01 NOTE — Telephone Encounter (Signed)
Please discontinue Qvar 58mg and send in Flovent 1145m 2 puffs BID.

## 2020-11-02 NOTE — Telephone Encounter (Signed)
[facility] Patient.  LM to call back. 

## 2020-11-02 NOTE — Telephone Encounter (Signed)
Pt is returning phone call. Pls regard; [phone].

## 2020-11-02 NOTE — Telephone Encounter (Signed)
Pt is returning phone call. Pls regard; [phone].  

## 2020-11-02 NOTE — Telephone Encounter (Signed)
Left message for patient to return call.

## 2020-11-03 ENCOUNTER — Other Ambulatory Visit: Payer: Self-pay

## 2020-11-03 NOTE — Telephone Encounter (Signed)
LMTCB for the pt 

## 2020-11-06 ENCOUNTER — Other Ambulatory Visit: Payer: Self-pay

## 2020-11-07 ENCOUNTER — Other Ambulatory Visit: Payer: Self-pay

## 2020-11-08 ENCOUNTER — Other Ambulatory Visit: Payer: Self-pay

## 2020-11-08 MED ORDER — TACROLIMUS 1 MG PO CAPS
ORAL_CAPSULE | ORAL | 5 refills | Status: DC
  Filled 2020-11-08: qty 60, 30d supply, fill #0
  Filled 2021-01-10: qty 60, 30d supply, fill #1

## 2020-11-09 ENCOUNTER — Encounter (HOSPITAL_COMMUNITY): Payer: Self-pay | Admitting: Cardiology

## 2020-11-09 DIAGNOSIS — D849 Immunodeficiency, unspecified: Secondary | ICD-10-CM | POA: Diagnosis not present

## 2020-11-09 DIAGNOSIS — E662 Morbid (severe) obesity with alveolar hypoventilation: Secondary | ICD-10-CM | POA: Diagnosis not present

## 2020-11-09 DIAGNOSIS — G4733 Obstructive sleep apnea (adult) (pediatric): Secondary | ICD-10-CM | POA: Diagnosis not present

## 2020-11-09 DIAGNOSIS — L0390 Cellulitis, unspecified: Secondary | ICD-10-CM | POA: Diagnosis not present

## 2020-11-09 DIAGNOSIS — Z940 Kidney transplant status: Secondary | ICD-10-CM | POA: Diagnosis not present

## 2020-11-09 DIAGNOSIS — I4891 Unspecified atrial fibrillation: Secondary | ICD-10-CM | POA: Diagnosis not present

## 2020-11-10 ENCOUNTER — Other Ambulatory Visit: Payer: Self-pay

## 2020-11-10 MED ORDER — TACROLIMUS 1 MG PO CAPS
ORAL_CAPSULE | ORAL | 5 refills | Status: DC
  Filled 2020-11-10 – 2020-11-14 (×2): qty 120, 30d supply, fill #0
  Filled 2020-12-06: qty 120, 30d supply, fill #1

## 2020-11-14 ENCOUNTER — Ambulatory Visit: Payer: 59 | Admitting: Adult Health

## 2020-11-14 ENCOUNTER — Other Ambulatory Visit: Payer: Self-pay

## 2020-11-14 ENCOUNTER — Other Ambulatory Visit (HOSPITAL_COMMUNITY): Payer: Self-pay

## 2020-11-15 ENCOUNTER — Other Ambulatory Visit: Payer: Self-pay

## 2020-11-15 ENCOUNTER — Encounter (HOSPITAL_COMMUNITY): Payer: Self-pay

## 2020-11-15 NOTE — Progress Notes (Signed)
Had a home visit with [name].  He states feeling good today.  He states weight is maintaining now.  He has been taking the 1 torsemide twice a day and metolazone as prescribed.  He watches his diet most of time and his fluids.  He tries to stay active around the home, mowing and cleaning.  He does goes to a gym most days.  Appetite is good.  He has been checking his BP daily but reading high.  It was good when I took it today but his was showing in 170's.  Advised him to relax, prop arm on pillow and do not talk when taking it.  It made a difference in his reading by 40 pts, his bp on his machine is now 130's.  He states will remember to do that.  He weighs daily.  He has all his medications and aware of how to take it.  He is aware of up coming appts.  He denies any problems such as chest pain, headaches, dizziness or increased shortness of breath.  Lungs sounds clear and edema is down in his legs.  Will continue to visit for heart failure.   [place] [phone]

## 2020-11-17 ENCOUNTER — Other Ambulatory Visit: Payer: Self-pay

## 2020-11-17 ENCOUNTER — Other Ambulatory Visit (HOSPITAL_COMMUNITY): Payer: Self-pay | Admitting: Cardiology

## 2020-11-17 MED FILL — Tamsulosin HCl Cap 0.4 MG: ORAL | 90 days supply | Qty: 90 | Fill #1 | Status: AC

## 2020-11-17 MED FILL — Bupropion HCl Tab ER 12HR 150 MG: ORAL | 90 days supply | Qty: 180 | Fill #1 | Status: AC

## 2020-11-20 ENCOUNTER — Other Ambulatory Visit: Payer: Self-pay

## 2020-11-20 ENCOUNTER — Emergency Department: Payer: 59

## 2020-11-20 ENCOUNTER — Emergency Department
Admission: EM | Admit: 2020-11-20 | Discharge: 2020-11-20 | Disposition: A | Discharge status: Home / Self Care | Payer: 59 | Attending: Emergency Medicine | Admitting: Emergency Medicine

## 2020-11-20 DIAGNOSIS — S8992XA Unspecified injury of left lower leg, initial encounter: Secondary | ICD-10-CM | POA: Diagnosis not present

## 2020-11-20 DIAGNOSIS — S80212A Abrasion, left knee, initial encounter: Secondary | ICD-10-CM | POA: Insufficient documentation

## 2020-11-20 DIAGNOSIS — I2510 Atherosclerotic heart disease of native coronary artery without angina pectoris: Secondary | ICD-10-CM | POA: Insufficient documentation

## 2020-11-20 DIAGNOSIS — I4821 Permanent atrial fibrillation: Secondary | ICD-10-CM | POA: Insufficient documentation

## 2020-11-20 DIAGNOSIS — Z79899 Other long term (current) drug therapy: Secondary | ICD-10-CM | POA: Diagnosis not present

## 2020-11-20 DIAGNOSIS — Z23 Encounter for immunization: Secondary | ICD-10-CM | POA: Insufficient documentation

## 2020-11-20 DIAGNOSIS — T148XXA Other injury of unspecified body region, initial encounter: Secondary | ICD-10-CM

## 2020-11-20 DIAGNOSIS — Z794 Long term (current) use of insulin: Secondary | ICD-10-CM | POA: Diagnosis not present

## 2020-11-20 DIAGNOSIS — S81811A Laceration without foreign body, right lower leg, initial encounter: Secondary | ICD-10-CM | POA: Diagnosis not present

## 2020-11-20 DIAGNOSIS — I132 Hypertensive heart and chronic kidney disease with heart failure and with stage 5 chronic kidney disease, or end stage renal disease: Secondary | ICD-10-CM | POA: Insufficient documentation

## 2020-11-20 DIAGNOSIS — W010XXA Fall on same level from slipping, tripping and stumbling without subsequent striking against object, initial encounter: Secondary | ICD-10-CM | POA: Diagnosis not present

## 2020-11-20 DIAGNOSIS — I5032 Chronic diastolic (congestive) heart failure: Secondary | ICD-10-CM | POA: Diagnosis not present

## 2020-11-20 DIAGNOSIS — E1122 Type 2 diabetes mellitus with diabetic chronic kidney disease: Secondary | ICD-10-CM | POA: Diagnosis not present

## 2020-11-20 DIAGNOSIS — Z87891 Personal history of nicotine dependence: Secondary | ICD-10-CM | POA: Insufficient documentation

## 2020-11-20 DIAGNOSIS — Z7951 Long term (current) use of inhaled steroids: Secondary | ICD-10-CM | POA: Insufficient documentation

## 2020-11-20 DIAGNOSIS — Z951 Presence of aortocoronary bypass graft: Secondary | ICD-10-CM | POA: Insufficient documentation

## 2020-11-20 DIAGNOSIS — Z7901 Long term (current) use of anticoagulants: Secondary | ICD-10-CM | POA: Diagnosis not present

## 2020-11-20 DIAGNOSIS — N186 End stage renal disease: Secondary | ICD-10-CM | POA: Diagnosis not present

## 2020-11-20 DIAGNOSIS — M25562 Pain in left knee: Secondary | ICD-10-CM | POA: Diagnosis not present

## 2020-11-20 DIAGNOSIS — Z940 Kidney transplant status: Secondary | ICD-10-CM | POA: Insufficient documentation

## 2020-11-20 DIAGNOSIS — J4540 Moderate persistent asthma, uncomplicated: Secondary | ICD-10-CM | POA: Insufficient documentation

## 2020-11-20 DIAGNOSIS — M7989 Other specified soft tissue disorders: Secondary | ICD-10-CM | POA: Diagnosis not present

## 2020-11-20 DIAGNOSIS — S8991XA Unspecified injury of right lower leg, initial encounter: Secondary | ICD-10-CM | POA: Diagnosis present

## 2020-11-20 MED ORDER — OXYCODONE HCL 5 MG PO TABS
5.0000 mg | ORAL_TABLET | Freq: Once | ORAL | Status: AC
  Administered 2020-11-20: 5 mg via ORAL
  Filled 2020-11-20: qty 1

## 2020-11-20 MED ORDER — TETANUS-DIPHTH-ACELL PERTUSSIS 5-2.5-18.5 LF-MCG/0.5 IM SUSY
0.5000 mL | PREFILLED_SYRINGE | Freq: Once | INTRAMUSCULAR | Status: AC
  Administered 2020-11-20: 0.5 mL via INTRAMUSCULAR
  Filled 2020-11-20: qty 0.5

## 2020-11-20 MED ORDER — ACETAMINOPHEN 500 MG PO TABS
1000.0000 mg | ORAL_TABLET | Freq: Once | ORAL | Status: AC
  Administered 2020-11-20: 1000 mg via ORAL
  Filled 2020-11-20: qty 2

## 2020-11-20 MED ORDER — OXYCODONE HCL 5 MG PO TABS
5.0000 mg | ORAL_TABLET | Freq: Three times a day (TID) | ORAL | 0 refills | Status: AC | PRN
  Filled 2020-11-20: qty 9, 3d supply, fill #0

## 2020-11-20 MED ORDER — CEPHALEXIN 500 MG PO CAPS
500.0000 mg | ORAL_CAPSULE | Freq: Two times a day (BID) | ORAL | 0 refills | Status: AC
  Filled 2020-11-20: qty 10, 5d supply, fill #0

## 2020-11-20 MED FILL — Dapagliflozin Propanediol Tab 10 MG (Base Equivalent): ORAL | 30 days supply | Qty: 30 | Fill #0 | Status: AC

## 2020-11-20 NOTE — Discharge Instructions (Addendum)
Stared antibiotics to prevent infection. Oxycodone to help with pain.  Follow up with ortho if not getting better in one week to evaluate for ligament injruy.

## 2020-11-20 NOTE — ED Triage Notes (Signed)
Pt come with c/o left knee pain following fall on Saturday. Pt states skin tear to right leg. Bandage in placed and bleeding controlled at this time.  Pt denies any LOC or hitting head.

## 2020-11-20 NOTE — ED Notes (Signed)
HINGD KN SPPRT, W/OPEN POP, XL applied on knee.

## 2020-11-20 NOTE — ED Provider Notes (Addendum)
[facility] Emergency Department Provider Note  ____________________________________________   Event Date/Time   First MD Initiated Contact with Patient 11/20/20 1302     (approximate)  I have reviewed the triage vital signs and the nursing notes.   HISTORY  Chief Complaint Fall    HPI [name]. is a 60 y.o. male with A. fib on Eliquis, diabetes, status post kidney transplant in 2012 who comes in with a mechanical fall.  Patient reports that he was outside on Saturday when he tripped on a lawn chair and he fell forward landing on his left knee and his right shin.  He reports pain in these areas mostly in the left knee making it more difficult to bear weight on the left leg.  He denies any hip pain bilaterally.  He states that it is very hard to keep his leg extended.  It feels better bent.  Has taken some ibuprofen to help with the pain.  He is adamant that he did not hit his head or lose consciousness and denies any headaches, neck pain, chest pain, abdominal pain.  No other extremity pain.  He states that he tried to manage at home but given the continued pain he decided to come in to the ER.  He has been using a cane to ambulate but feels like knee is going to give out.        Past Medical History:  Diagnosis Date   Allergy    seasonal   Amnestic MCI (mild cognitive impairment with memory loss)    Asthma    Benign neoplasm of colon    CAD (coronary artery disease) 2005   a.  Status post four-vessel CABG in 2005; b. 03/2018 MV: Small, fixed inferoapical and apical septal defect. No ischemia. EF 47% (60-65% by 05/2018 Echo).   Chronic atrial fibrillation ([place])    a. CHADS2VASc 4 (CHF, HTN, DM, vascular disease)--> Eliquis; b. 09/2018 Zio: Chronic Afib.   Chronic combined systolic and diastolic CHF (congestive heart failure) ([place])    a.  7/18 Echo: EF 45-50%; b. 05/2018 Echo: EF 60-65%, RVSP 74.8; c. 12/2018 Echo: EF 50-55%. Sev dil LA.    Chronic venous stasis dermatitis of both lower extremities    Cytomegaloviral disease ([place]) 2017   Depression    Diabetes mellitus    Diverticulosis    Dyspnea    Erectile dysfunction    ESRD (end stage renal disease) ([place]) 2005   a. HD 2004-2012; b. S/p renal transplant in 2012   GERD (gastroesophageal reflux disease)    Gout    Hyperlipidemia    low since renal failure   Hypertension    Kidney transplant status, cadaveric 2012   Obesity    PAH (pulmonary artery hypertension) ([place])    a. 05/2018 Echo: RVSP 74.66mHg; b. 12/2018 RV not well visualized.   Pneumonia    Purpura ([place]    Sleep apnea    bipap with oxygen 2 l   Trigger finger    Ulcer 05/2016   Left shin   Wears glasses     Patient Active Problem List   Diagnosis Date Noted   Concentration deficit 10/06/2020   Cellulitis and abscess of left leg 07/22/2020   Acute gout of right elbow 05/25/2020   Erectile dysfunction due to arterial insufficiency 10/20/2019   Sleep disturbance 07/23/2019   Permanent atrial fibrillation ([place]    Atypical pneumonia 07/01/2019   Synovitis of finger 06/11/2019   Pulmonary hypertension  due to left heart disease ([place]) 01/20/2019   Trigger finger, unspecified little finger 12/23/2018   Superficial thrombophlebitis 12/23/2018   CHF (congestive heart failure) ([place]) 11/05/2018   Gout attack 03/30/2018   Cervical sprain, initial encounter 03/02/2018   Moderate persistent asthma 05/21/2017   Chronic diastolic heart failure ([place]) 11/19/2016   Chronic venous insufficiency 06/05/2016   Proteinuria 06/23/2015   BPH with obstruction/lower urinary tract symptoms 09/25/2014   Hypogonadism in male 09/25/2014   Immunosuppressed status ([place]) 09/09/2014   Type 2 diabetes mellitus with hyperglycemia, with long-term current use of insulin ([place]) 09/09/2014   Chronic atrial fibrillation ([place]) 07/18/2014   DM eye manif type II 10/27/2013   MDD (recurrent major depressive disorder) in remission ([place])  10/04/2013   Benign neoplasm of colon 04/22/2013   Diverticulosis of colon (without mention of hemorrhage) 04/22/2013   History of renal transplant 03/01/2013   Asthma, persistent controlled 02/25/2013   Erectile dysfunction 10/20/2012   Obstructive sleep apnea 09/29/2012   Obesity hypoventilation syndrome ([place]) 03/31/2012   Routine general medical examination at a health care facility 11/12/2011   Type 2 diabetes, controlled, with renal manifestation ([place]) 03/01/2011   Essential hypertension 12/17/2010   Coronary artery disease involving native coronary artery of native heart without angina pectoris 12/17/2010    Past Surgical History:  Procedure Laterality Date   BARIATRIC [place]  05/03/2019   CATARACT EXTRACTION W/PHACO Right 10/29/2017   Procedure: CATARACT EXTRACTION PHACO AND INTRAOCULAR LENS PLACEMENT ([place])  RIGHT DIABETIC;  Surgeon: [name], MD;  Location: [place];  Service: Ophthalmology;  Laterality: Right;  Diabetic - insulin   CATARACT EXTRACTION W/PHACO Left 11/18/2017   Procedure: CATARACT EXTRACTION PHACO AND INTRAOCULAR LENS PLACEMENT ([place]) LEFT IVA/TOPICAL;  Surgeon: [name], MD;  Location: [place];  Service: Ophthalmology;  Laterality: Left;  DIABETES - insulin sleep apnea   COLONOSCOPY WITH PROPOFOL N/A 04/22/2013   Procedure: COLONOSCOPY WITH PROPOFOL;  Surgeon: [name], MD;  Location: [facility];  Service: Endoscopy;  Laterality: N/A;   CORONARY ARTERY BYPASS GRAFT  2005   X 4   DG ANGIO AV SHUNT*L*     right and left upper arms   FASCIOTOMY  03/03/2012   Procedure: FASCIOTOMY;  Surgeon: [name], MD;  Location: [place];  Service: Orthopedics;  Laterality: Right;  FASCIOTOMY RIGHT SMALL FINGER   FASCIOTOMY Left 08/17/2013   Procedure: FASCIOTOMY LEFT RING;  Surgeon: [name], MD;  Location: [place];   Service: Orthopedics;  Laterality: Left;   INCISION AND DRAINAGE ABSCESS Left 10/15/2015   Procedure: INCISION AND DRAINAGE ABSCESS;  Surgeon: [name], MD;  Location: [facility];  Service: General;  Laterality: Left;   KIDNEY TRANSPLANT  09/13/2010   cadaver--at [facility] HEART CATH N/A 11/15/2016   Procedure: RIGHT HEART CATH;  Surgeon: [name], MD;  Location: [place] [facility];  Service: Cardiovascular;  Laterality: N/A;   RIGHT HEART CATH N/A 05/03/2019   Procedure: RIGHT HEART CATH;  Surgeon: [name], MD;  Location: [place] [facility];  Service: Cardiovascular;  Laterality: N/A;   TYMPANIC MEMBRANE REPAIR  1/12   left   VASECTOMY      Prior to Admission medications   Medication Sig Start Date End Date Taking? Authorizing Provider  albuterol (PROVENTIL) (2.5 MG/3ML) 0.083% nebulizer solution Take 3 mLs (2.5  mg total) by nebulization every 6 (six) hours as needed for wheezing or shortness of breath. 09/13/20   Copland, Frederico Hamman, MD  albuterol (VENTOLIN HFA) 108 (90 Base) MCG/ACT inhaler Inhale 2 puffs into the lungs every 6 (six) hours as needed for wheezing or shortness of breath. 10/17/20   Martyn Ehrich, NP  apixaban (ELIQUIS) 5 MG TABS tablet TAKE 1 TABLET BY MOUTH TWICE DAILY 07/04/20 07/04/21  [name], MD  beclomethasone (QVAR) 80 MCG/ACT inhaler Inhale 2 puffs into the lungs 2 (two) times daily. 10/17/20   Martyn Ehrich, NP  benzonatate (TESSALON) 200 MG capsule Take 1 capsule (200 mg total) by mouth 3 (three) times daily as needed for cough. Patient not taking: Reported on 11/15/2020 09/13/20   Owens Loffler, MD  buPROP[facility] SR) 150 MG 12 hr tablet TAKE 1 TABLET BY MOUTH TWICE DAILY 05/24/20 05/24/21  [name] I, MD  cholecalciferol (VITAMIN D3) 25 MCG (1000 UNIT) tablet Take 1,000 Units by mouth daily.    [provider]  Cyanocobalamin (B-12 PO) Take by mouth.    [provider]  dapagliflozin propanediol  (FARXIGA) 10 MG TABS tablet TAKE 1 TABLET (10 MG TOTAL) BY MOUTH DAILY BEFORE BREAKFAST. 11/20/20   [name], MD  Febuxostat 80 MG TABS Take 1 tablet (80 mg total) by mouth once daily for 180 days 07/05/20     FERREX 150 150 MG capsule Take 150 mg by mouth daily.  09/23/19   [provider]  fluticasone (FLONASE) 50 MCG/ACT nasal spray PLACE 2 SPRAYS INTO BOTH NOSTRILS DAILY. 06/30/20 06/30/21  [name], MD  gabapentin (NEURONTIN) 300 MG capsule TAKE 1 CAPSULE BY MOUTH TWICE DAILY 10/20/20     HUMALOG KWIKPEN 100 UNIT/ML KwikPen Inject 5-10 Units into the skin 3 (three) times daily. Sliding scale 09/01/18   [provider]  insulin glargine-yfgn (SEMGLEE) 100 UNIT/ML Pen Inject 40 Units subcutaneously once daily 10/06/20     Insulin Pen Needle 31G X 5 MM MISC USE AS DIRECTED FOUR TIMES DAILY. 01/19/20 01/18/21  Judi Cong, MD  ketoconazole (NIZORAL) 2 % shampoo Lather onto skin and leave on for 5 minutes before rinsing once daily until clear, then once a week for maintenance 10/13/20     metolazone (ZAROXOLYN) 2.5 MG tablet Take 1 tablet by mouth 2 days per week 10/23/20   [name], MD  montelukast (SINGULAIR) 10 MG tablet TAKE 1 TABLET BY MOUTH AT BEDTIME. 10/17/20 10/17/21  Martyn Ehrich, NP  Multiple Vitamin (MULTIVITAMIN WITH MINERALS) TABS tablet Take 1 tablet by mouth daily.    [provider]  mycophenolate (MYFORTIC) 180 MG EC tablet Take 360 mg by mouth 2 (two) times daily.    [provider]  omeprazole (PRILOSEC) 20 MG capsule TAKE 1 CAPSULE BY MOUTH DAILY. 10/05/19 12/30/20  [name], MD  Potassium Chloride CR (MICRO-K) 8 MEQ CPCR capsule CR Take 9 capsules (72 mEq total) by mouth 2 (two) times daily. 09/20/20   [name], MD  predniSONE (DELTASONE) 5 MG tablet Take 1 tablet (5 mg total) by mouth daily. 08/14/20     rosuvastatin (CRESTOR) 10 MG tablet TAKE 1 TABLET BY MOUTH DAILY 06/23/20 06/23/21  Furth, Cadence H, PA-C   sulfamethoxazole-trimethoprim (BACTRIM) 400-80 MG tablet TAKE 1 TABLET BY MOUTH EVERY MONDAY, WEDNESDAY, FRIDAY. 08/23/20     tacrolimus (PROGRAF) 0.5 MG capsule TAKE 1 CAPSULE (0.5 MG TOTAL) BY MOUTH EVERY EVENING. Patient taking differently:  Take 0.5-1.5 mg by mouth 2 (two) times daily. 1.49m in am and 0.528min evenings. 05/12/20 05/12/21  Close, ShLorne SkeensNP  tacrolimus (PROGRAF) 1 MG capsule Take 1 capsule (1 mg total) by mouth 2 times daily. Patient not taking: Reported on 11/15/2020 11/08/20     tacrolimus (PROGRAF) 1 MG capsule Take 2 capsules (2 mg total) by mouth 2 times daily. 11/10/20     tadalafil, PAH, (ADCIRCA) 20 MG tablet TAKE 1 TABLET (20 MG TOTAL) BY MOUTH EVERY OTHER DAY. 01/28/20 01/27/21  ArWellington HampshireMD  tamsulosin (FLOMAX) 0.4 MG CAPS capsule TAKE 1 CAPSULE BY MOUTH DAILY. 05/23/20 05/23/21  JoBelinda BlockMD  torsemide (DEMADEX) 100 MG tablet Take 1 tablet (100 mg total) by mouth 2 (two) times daily. 10/30/20 10/30/21  MiRafael BihariFNP  vitamin C (ASCORBIC ACID) 250 MG tablet Take 1 tablet (250 mg total) by mouth daily. 07/04/19   ShMax SaneMD  zinc sulfate 220 (50 Zn) MG capsule Take 220 mg by mouth daily.    [provider]    Allergies Contrast media [iodinated diagnostic agents] and Ibuprofen  Family History  Problem Relation Age of Onset   Heart disease Father    Kidney failure Father    Kidney disease Father    Diabetes Maternal Grandmother    Breast cancer Maternal Grandmother    Valvular heart disease Mother    Liver cancer Paternal Uncle    Liver cancer Paternal Grandmother    Prostate cancer Neg Hx     Social History Social History   Tobacco Use   Smoking status: Former    Types: Cigars    Quit date: 09/02/1994    Years since quitting: 26.2   Smokeless tobacco: Never  Vaping Use   Vaping Use: Never used  Substance Use Topics   Alcohol use: Yes    Alcohol/week: 0.0 - 1.0 standard drinks    Comment: occasional- 3 in the  past year   Drug use: No      Review of Systems Constitutional: No fever/chills, fall Eyes: No visual changes. ENT: No sore throat. Cardiovascular: Denies chest pain. Respiratory: Denies shortness of breath. Gastrointestinal: No abdominal pain.  No nausea, no vomiting.  No diarrhea.  No constipation. Genitourinary: Negative for dysuria. Musculoskeletal: Knee pain, leg pain Skin: Negative for rash. Neurological: Negative for headaches, focal weakness or numbness. All other ROS negative ____________________________________________   PHYSICAL EXAM:  VITAL SIGNS: ED Triage Vitals  Enc Vitals Group     BP 11/20/20 1239 (!) 169/80     Pulse Rate 11/20/20 1239 66     Resp 11/20/20 1239 18     Temp 11/20/20 1239 97.8 F (36.6 C)     Temp Source 11/20/20 1239 Oral     SpO2 11/20/20 1239 97 %     Weight 11/20/20 1247 263 lb 14.3 oz (119.7 kg)     Height 11/20/20 1247 _0  (1.778 m)     Head Circumference --      Peak Flow --      Pain Score 11/20/20 1237 8     Pain Loc --      Pain Edu? --      Excl. in [place]--     Constitutional: Alert and oriented. Well appearing and in no acute distress. Eyes: Conjunctivae are normal. EOMI. Head: Atraumatic. Nose: No congestion/rhinnorhea. Mouth/Throat: Mucous membranes are moist.   Neck: No stridor. Trachea Midline. FROM.  No C-spine tenderness. Cardiovascular:  Normal rate, regular rhythm. Grossly normal heart sounds.  Good peripheral circulation. Respiratory: Normal respiratory effort.  No retractions. Lungs CTAB. Gastrointestinal: Soft and nontender. No distention. No abdominal bruits.  Musculoskeletal: Patient with swelling noted to the left knee with tenderness along the patella.  Limited motion secondary to pain. But still able to slightly flex and extend.  No pain of the hip.  No tib/fib, no ankle or foot pain.  Skin tear noted to the right shin with some tenderness along the bone.  No other tenderness along the leg.  Able to lift  the leg up off the bed and bend the leg.  2+ distal pulses bilaterally. Neurologic:  Normal speech and language. No gross focal neurologic deficits are appreciated.  Skin:  Skin is warm, dry and intact. No rash noted. Psychiatric: Mood and affect are normal. Speech and behavior are normal. GU: Deferred   ____________________________________________  RADIOLOGY Robert Bellow, personally viewed and evaluated these images (plain radiographs) as part of my medical decision making, as well as reviewing the written report by the radiologist.  ED MD interpretation:  no fractures   Official radiology report(s): DG Tibia/Fibula Right  Result Date: 11/20/2020 CLINICAL DATA:  Fall.  Left knee pain.  Skin tear. EXAM: RIGHT TIBIA AND FIBULA - 2 VIEW COMPARISON:  None. FINDINGS: Soft tissue swelling is noted anterior to the distal tibia. No underlying fracture is present. Ankle and knee joint is located. Degenerative changes are noted at the ankle. Microvascular calcifications noted. IMPRESSION: 1. Soft tissue swelling anterior to the distal tibia without underlying fracture. 2. Microvascular calcifications suggesting diabetes. Electronically Signed   By: San Morelle M.D.   On: 11/20/2020 14:04   DG Knee Complete 4 Views Left  Result Date: 11/20/2020 CLINICAL DATA:  Fall, pain EXAM: LEFT KNEE - COMPLETE 4+ VIEW COMPARISON:  03/07/2020 FINDINGS: No evidence of fracture, dislocation, or joint effusion. No evidence of arthropathy or other focal bone abnormality. Soft tissues are unremarkable. Vascular calcinosis. IMPRESSION: No fracture or dislocation of the left knee. Joint spaces are well preserved. No knee joint effusion. Electronically Signed   By: Eddie Candle M.D.   On: 11/20/2020 14:02    ____________________________________________   PROCEDURES  Procedure(s) performed (including Critical Care):  Procedures   ____________________________________________   INITIAL IMPRESSION /  ASSESSMENT AND PLAN / ED COURSE  Previn Argyle Gustafson. was evaluated in Emergency Department on 11/20/2020 for the symptoms described in the history of present illness. He was evaluated in the context of the global COVID-19 pandemic, which necessitated consideration that the patient might be at risk for infection with the SARS-CoV-2 virus that causes COVID-19. Institutional protocols and algorithms that pertain to the evaluation of patients at risk for COVID-19 are in a state of rapid change based on information released by regulatory bodies including the CDC and federal and state organizations. These policies and algorithms were followed during the patient's care in the ED.    Patient comes in with mechanical fall with injury to the left knee and right shin.  We will get x-rays to make sure no evidence of fracture, dislocation or other acute pathology.  Patient skin tear on his right tib-fib is pretty impressive but it is too far out and unable to approximate any edges at this time.  Given his immunosuppressed state I think would be reasonable to get him started on some antibiotics to help prevent infection.  Patient is agreeable to this plan.  This time there is  no evidence of cellulitis.  Will update patient's tetanus.  Patient is on a blood thinner but he is adamant that he did not hit his head and this is a few days out without any headaches or altered mental status unlikely intracranial hemorrhage.  He denies any cervical spine pain or chest or abdominal pain to suggest other injuries.   We will give a dose of Tylenol and oxycodone to help with pain  Xray negative. Pt feeling better after medications. Provided hinged knee brace  and antibiotics and some oxycodone to help with pain at home. Pt states he can get around with walker and/or cane while healing> We discussed risk of oxycodone and how it can cause some sedation and he states he will use sparingly and at nightime mostly. Discussed not driving  on it. Pt will f/u with ortho if continued limitation for rule out ligament injury       ____________________________________________   FINAL CLINICAL IMPRESSION(S) / ED DIAGNOSES   Final diagnoses:  Injury of left knee, initial encounter  Skin abrasion      MEDICATIONS GIVEN DURING THIS VISIT:  Medications  acetaminophen (TYLENOL) tablet 1,000 mg (1,000 mg Oral Given 11/20/20 1315)  oxyCODONE (Oxy IR/ROXICODONE) immediate release tablet 5 mg (5 mg Oral Given 11/20/20 1315)  Tdap (BOOSTRIX) injection 0.5 mL (0.5 mLs Intramuscular Given 11/20/20 1315)     ED Discharge Orders          Ordered    cephALEXin (KEFLEX) 500 MG capsule  2 times daily        11/20/20 1507    oxyCODONE (ROXICODONE) 5 MG immediate release tablet  Every 8 hours PRN        11/20/20 1507             Note:  This document was prepared using Dragon voice recognition software and may include unintentional dictation errors.    Vanessa [place], MD 11/20/20 1513    Vanessa [place], MD 11/20/20 [phone]

## 2020-11-22 ENCOUNTER — Other Ambulatory Visit: Payer: Self-pay

## 2020-11-22 DIAGNOSIS — M25562 Pain in left knee: Secondary | ICD-10-CM | POA: Diagnosis not present

## 2020-11-22 MED ORDER — OXYCODONE HCL 5 MG PO TABS
ORAL_TABLET | ORAL | 0 refills | Status: DC
  Filled 2020-11-22: qty 30, 7d supply, fill #0

## 2020-11-23 ENCOUNTER — Other Ambulatory Visit: Payer: Self-pay

## 2020-11-25 DIAGNOSIS — E662 Morbid (severe) obesity with alveolar hypoventilation: Secondary | ICD-10-CM | POA: Diagnosis not present

## 2020-11-25 DIAGNOSIS — G4733 Obstructive sleep apnea (adult) (pediatric): Secondary | ICD-10-CM | POA: Diagnosis not present

## 2020-11-28 DIAGNOSIS — E1165 Type 2 diabetes mellitus with hyperglycemia: Secondary | ICD-10-CM | POA: Diagnosis not present

## 2020-11-29 ENCOUNTER — Other Ambulatory Visit: Payer: Self-pay

## 2020-11-29 ENCOUNTER — Encounter (HOSPITAL_COMMUNITY): Payer: 59

## 2020-11-29 DIAGNOSIS — M25562 Pain in left knee: Secondary | ICD-10-CM | POA: Diagnosis not present

## 2020-11-29 DIAGNOSIS — G4730 Sleep apnea, unspecified: Secondary | ICD-10-CM | POA: Diagnosis not present

## 2020-11-29 DIAGNOSIS — R0683 Snoring: Secondary | ICD-10-CM | POA: Diagnosis not present

## 2020-11-30 NOTE — Telephone Encounter (Signed)
Opened in error

## 2020-12-01 ENCOUNTER — Other Ambulatory Visit: Payer: Self-pay

## 2020-12-01 ENCOUNTER — Other Ambulatory Visit: Payer: Self-pay | Admitting: Cardiovascular Disease

## 2020-12-01 MED ORDER — TADALAFIL (PAH) 20 MG PO TABS
ORAL_TABLET | ORAL | 1 refills | Status: DC
Start: 2020-12-01 — End: 2021-09-28
  Filled 2020-12-01: qty 15, 30d supply, fill #0
  Filled 2021-01-10: qty 15, 30d supply, fill #1
  Filled 2021-02-08: qty 15, 30d supply, fill #2
  Filled 2021-03-20: qty 15, 30d supply, fill #3
  Filled 2021-04-30: qty 15, 30d supply, fill #4
  Filled 2021-06-11: qty 15, 30d supply, fill #5
  Filled 2021-07-12: qty 15, 30d supply, fill #6
  Filled 2021-08-21: qty 15, 30d supply, fill #7

## 2020-12-01 MED ORDER — POLYSACCHARIDE IRON COMPLEX 150 MG PO CAPS
150.0000 mg | ORAL_CAPSULE | Freq: Two times a day (BID) | ORAL | 1 refills | Status: DC
  Filled 2020-12-01: qty 180, 90d supply, fill #0
  Filled 2021-03-08: qty 180, 90d supply, fill #1

## 2020-12-04 ENCOUNTER — Other Ambulatory Visit: Payer: Self-pay

## 2020-12-06 ENCOUNTER — Other Ambulatory Visit: Payer: Self-pay

## 2020-12-07 ENCOUNTER — Other Ambulatory Visit: Payer: Self-pay

## 2020-12-08 DIAGNOSIS — M7652 Patellar tendinitis, left knee: Secondary | ICD-10-CM | POA: Diagnosis not present

## 2020-12-10 DIAGNOSIS — G4733 Obstructive sleep apnea (adult) (pediatric): Secondary | ICD-10-CM | POA: Diagnosis not present

## 2020-12-11 ENCOUNTER — Other Ambulatory Visit: Payer: Self-pay

## 2020-12-11 MED ORDER — PREDNISONE 10 MG PO TABS
ORAL_TABLET | ORAL | 0 refills | Status: DC
  Filled 2020-12-11: qty 30, 12d supply, fill #0

## 2020-12-12 ENCOUNTER — Ambulatory Visit: Payer: 59 | Admitting: Physician Assistant

## 2020-12-12 ENCOUNTER — Other Ambulatory Visit: Payer: Self-pay

## 2020-12-13 DIAGNOSIS — M25562 Pain in left knee: Secondary | ICD-10-CM | POA: Diagnosis not present

## 2020-12-13 DIAGNOSIS — M7652 Patellar tendinitis, left knee: Secondary | ICD-10-CM | POA: Diagnosis not present

## 2020-12-14 ENCOUNTER — Other Ambulatory Visit: Payer: Self-pay

## 2020-12-14 MED ORDER — COLCHICINE 0.6 MG PO TABS
ORAL_TABLET | ORAL | 1 refills | Status: DC
  Filled 2020-12-14: qty 30, 60d supply, fill #0
  Filled 2021-03-20: qty 30, 60d supply, fill #1

## 2020-12-14 MED FILL — Dapagliflozin Propanediol Tab 10 MG (Base Equivalent): ORAL | 30 days supply | Qty: 30 | Fill #1 | Status: AC

## 2020-12-15 ENCOUNTER — Telehealth: Payer: Self-pay | Admitting: Internal Medicine

## 2020-12-15 NOTE — Telephone Encounter (Signed)
Called patient states that you normally have forms and he does not have to do anything? Was not sure if you have filed somewhere or not. Let patient know I would send you a message and you would address once you return to office.

## 2020-12-15 NOTE — Telephone Encounter (Signed)
Pt called stating that he would like for Dr.Letvak to fill out his FMLA papers

## 2020-12-18 NOTE — Telephone Encounter (Signed)
Spoke to pt. We have one forms scanned in that will require new OV dates attached. So he is going to fax a blank copy. I will will out and have blank form scanned in so we will always have it.

## 2020-12-20 ENCOUNTER — Other Ambulatory Visit: Payer: Self-pay

## 2020-12-20 ENCOUNTER — Other Ambulatory Visit (HOSPITAL_COMMUNITY): Payer: Self-pay

## 2020-12-20 NOTE — Progress Notes (Signed)
[name] states he is getting better.  He had a fall tripping over a lounge chair outside.  He has messed up his knee.  He is taking physical therapy and visiting with Ortho.  He is traveling to [place], states going with wife.  He will not be able to do much but relax.  He states weight is staying down.  He watches his diet and fluids.  He has all his medications and knows how to take them.  He denies swelling in extremities and denies abdominal fullness.  He mood sounds good.  He is aware of up coming appts.  His wife his is support with other family close by.  He has everything for daily living.  He denies chest pain, headaches, dizziness or increased shortness of breath.  Will continue to visit for heart failure, diet and medication compliance.   [place] [phone]

## 2020-12-21 ENCOUNTER — Ambulatory Visit: Payer: 59 | Admitting: Cardiovascular Disease

## 2020-12-25 DIAGNOSIS — G4733 Obstructive sleep apnea (adult) (pediatric): Secondary | ICD-10-CM | POA: Diagnosis not present

## 2020-12-25 DIAGNOSIS — E662 Morbid (severe) obesity with alveolar hypoventilation: Secondary | ICD-10-CM | POA: Diagnosis not present

## 2020-12-26 ENCOUNTER — Other Ambulatory Visit: Payer: Self-pay

## 2020-12-26 ENCOUNTER — Ambulatory Visit (INDEPENDENT_AMBULATORY_CARE_PROVIDER_SITE_OTHER): Payer: 59 | Admitting: Psychology

## 2020-12-26 ENCOUNTER — Telehealth: Payer: Self-pay | Admitting: Internal Medicine

## 2020-12-26 DIAGNOSIS — M24549 Contracture, unspecified hand: Secondary | ICD-10-CM

## 2020-12-26 DIAGNOSIS — R41840 Attention and concentration deficit: Secondary | ICD-10-CM

## 2020-12-26 NOTE — Addendum Note (Signed)
Addended by: [name] I on: 12/26/2020 10:07 AM   Modules accepted: Orders

## 2020-12-26 NOTE — Telephone Encounter (Signed)
Spoke to pt. Said hand specialist is fine. Thinks he saw them around 2013.

## 2020-12-26 NOTE — Telephone Encounter (Signed)
Pt called to check status. Did you get the FMLA paperwork?

## 2020-12-26 NOTE — Progress Notes (Signed)
   NEUROPSYCHOLOGICAL EVALUATION [place] [place] Department of Neurology     [name] experienced a flat tire on his way to his appointment this morning and was unable to attend. He was rescheduled for 05/22/2021 at 8:30am.

## 2020-12-26 NOTE — Telephone Encounter (Signed)
   Reason for Referral Request: Trouble with pinky and thumb not expanding fully  Has patient been seen PCP for this complaint? Yes  Referral for which specialty: Orthopedic  Preferred office/provider: Pt states he was referred to ortho in the past and he wants to go to the same doctor. He does not remember who or what office he saw, but he did say it is in [place] on [address]. [address]. Close to the hospital.

## 2020-12-27 ENCOUNTER — Other Ambulatory Visit: Payer: Self-pay

## 2020-12-27 ENCOUNTER — Other Ambulatory Visit: Payer: Self-pay | Admitting: Internal Medicine

## 2020-12-27 MED FILL — Rosuvastatin Calcium Tab 10 MG: ORAL | 90 days supply | Qty: 90 | Fill #1 | Status: AC

## 2020-12-27 NOTE — Telephone Encounter (Addendum)
Spoke to pt. Advised him I have not received anything in the stack of faxes I have. I gave him my email address to see if they would send it that way.[name]

## 2020-12-28 ENCOUNTER — Other Ambulatory Visit: Payer: Self-pay

## 2020-12-28 ENCOUNTER — Other Ambulatory Visit: Payer: Self-pay | Admitting: Internal Medicine

## 2020-12-28 ENCOUNTER — Ambulatory Visit: Payer: 59 | Admitting: Medical

## 2020-12-28 DIAGNOSIS — E1165 Type 2 diabetes mellitus with hyperglycemia: Secondary | ICD-10-CM | POA: Diagnosis not present

## 2020-12-28 MED FILL — Omeprazole Cap Delayed Release 20 MG: ORAL | 90 days supply | Qty: 90 | Fill #0 | Status: AC

## 2021-01-01 ENCOUNTER — Telehealth (INDEPENDENT_AMBULATORY_CARE_PROVIDER_SITE_OTHER): Payer: 59 | Admitting: Nurse Practitioner

## 2021-01-01 ENCOUNTER — Other Ambulatory Visit: Payer: Self-pay | Admitting: Nurse Practitioner

## 2021-01-01 ENCOUNTER — Encounter: Payer: Self-pay | Admitting: Nurse Practitioner

## 2021-01-01 ENCOUNTER — Other Ambulatory Visit: Payer: Self-pay

## 2021-01-01 ENCOUNTER — Ambulatory Visit: Payer: 59 | Admitting: Medical

## 2021-01-01 ENCOUNTER — Other Ambulatory Visit (INDEPENDENT_AMBULATORY_CARE_PROVIDER_SITE_OTHER): Payer: 59

## 2021-01-01 VITALS — BP 155/88 | HR 82 | Temp 98.5°F

## 2021-01-01 DIAGNOSIS — R52 Pain, unspecified: Secondary | ICD-10-CM | POA: Insufficient documentation

## 2021-01-01 DIAGNOSIS — J029 Acute pharyngitis, unspecified: Secondary | ICD-10-CM

## 2021-01-01 DIAGNOSIS — J020 Streptococcal pharyngitis: Secondary | ICD-10-CM

## 2021-01-01 HISTORY — DX: Pain, unspecified: R52

## 2021-01-01 LAB — POCT INFLUENZA A/B
Influenza A, POC: NEGATIVE
Influenza B, POC: NEGATIVE

## 2021-01-01 LAB — POCT RAPID STREP A (OFFICE): Rapid Strep A Screen: POSITIVE — AB

## 2021-01-01 MED ORDER — AMOXICILLIN 500 MG PO CAPS
500.0000 mg | ORAL_CAPSULE | Freq: Two times a day (BID) | ORAL | 0 refills | Status: AC
  Filled 2021-01-01: qty 20, 10d supply, fill #0

## 2021-01-01 NOTE — Telephone Encounter (Signed)
Mrs Louque wanted to check on FMLA paperwork if anything has come through yet? She gave the department that suppose to fax this over our fax number and is not sure if they can use email to send this form over.

## 2021-01-01 NOTE — Progress Notes (Signed)
Patient ID: Carl Plotts., male    DOB: [birth year], 60 y.o.   MRN: [id number]  Virtual visit completed through [facility], a video enabled telemedicine application. Due to national recommendations of social distancing due to COVID-19, a virtual visit is felt to be most appropriate for this patient at this time. Reviewed limitations, risks, security and privacy concerns of performing a virtual visit and the availability of in person appointments. I also reviewed that there may be a patient responsible charge related to this service. The patient agreed to proceed.   Patient location: home Provider location: [place] at [facility], office Persons participating in this virtual visit: patient, provider   If any vitals were documented, they were collected by patient at home unless specified below.    BP (!) 155/88   Pulse 82   Temp 98.5 F (36.9 C)    CC: Sore throat, cough, nasal drainage Subjective:   HPI: Carl Rollie Hynek. is a 60 y.o. male presenting on 01/01/2021 for Cough (Runny nose, sore throat, head congestion, sinus pressure, headache, feels like he has fluid in his ear, body aches, fatigue. Sx started on 12/31/20. Did not test for covid)    Symptoms started yesterday 12/31/2020 Has not tested for covid yet. Vaccinated x2 with two booster. No toc treatment thus far    Relevant past medical, surgical, family and social history reviewed and updated as indicated. Interim medical history since our last visit reviewed. Allergies and medications reviewed and updated. Outpatient Medications Prior to Visit  Medication Sig Dispense Refill   albuterol (PROVENTIL) (2.5 MG/3ML) 0.083% nebulizer solution Take 3 mLs (2.5 mg total) by nebulization every 6 (six) hours as needed for wheezing or shortness of breath. 150 mL 1   albuterol (VENTOLIN HFA) 108 (90 Base) MCG/ACT inhaler Inhale 2 puffs into the lungs every 6 (six) hours as needed for wheezing or shortness of breath.  18 g 5   apixaban (ELIQUIS) 5 MG TABS tablet TAKE 1 TABLET BY MOUTH TWICE DAILY 180 tablet 1   beclomethasone (QVAR) 80 MCG/ACT inhaler Inhale 2 puffs into the lungs 2 (two) times daily. 10.6 g 5   buPROPion (WELLBUTRIN SR) 150 MG 12 hr tablet TAKE 1 TABLET BY MOUTH TWICE DAILY 180 tablet 3   cholecalciferol (VITAMIN D3) 25 MCG (1000 UNIT) tablet Take 1,000 Units by mouth daily.     colchicine 0.6 MG tablet Take 1 tablet (0.6 mg total) by mouth every other day 30 tablet 1   Cyanocobalamin (B-12 PO) Take by mouth.     dapagliflozin propanediol (FARXIGA) 10 MG TABS tablet TAKE 1 TABLET (10 MG TOTAL) BY MOUTH DAILY BEFORE BREAKFAST. 30 tablet 6   Febuxostat 80 MG TABS Take 1 tablet (80 mg total) by mouth once daily for 180 days 90 tablet 1   fluticasone (FLONASE) 50 MCG/ACT nasal spray PLACE 2 SPRAYS INTO BOTH NOSTRILS DAILY. 16 g 11   gabapentin (NEURONTIN) 300 MG capsule TAKE 1 CAPSULE BY MOUTH TWICE DAILY 180 capsule 1   HUMALOG KWIKPEN 100 UNIT/ML KwikPen Inject 5-10 Units into the skin 3 (three) times daily. Sliding scale     insulin glargine-yfgn (SEMGLEE) 100 UNIT/ML Pen Inject 40 Units subcutaneously once daily 15 mL 5   Insulin Pen Needle 31G X 5 MM MISC USE AS DIRECTED FOUR TIMES DAILY. 400 each 3   iron polysaccharides (FERREX 150) 150 MG capsule TAKE 1 CAPSULE BY MOUTH 2 TIMES DAILY. 180 capsule 1   metolazone (ZAROXOLYN) 2.5  MG tablet Take 1 tablet by mouth 2 days per week 10 tablet 0   montelukast (SINGULAIR) 10 MG tablet TAKE 1 TABLET BY MOUTH AT BEDTIME. 90 tablet 3   Multiple Vitamin (MULTIVITAMIN WITH MINERALS) TABS tablet Take 1 tablet by mouth daily.     mycophenolate (MYFORTIC) 180 MG EC tablet Take 360 mg by mouth 2 (two) times daily.     omeprazole (PRILOSEC) 20 MG capsule TAKE 1 CAPSULE BY MOUTH DAILY. 90 capsule 3   Potassium Chloride CR (MICRO-K) 8 MEQ CPCR capsule CR Take 9 capsules (72 mEq total) by mouth 2 (two) times daily. 450 capsule 3   predniSONE (DELTASONE) 5 MG  tablet Take 1 tablet (5 mg total) by mouth daily. 90 tablet 3   rosuvastatin (CRESTOR) 10 MG tablet TAKE 1 TABLET BY MOUTH DAILY 90 tablet 2   sulfamethoxazole-trimethoprim (BACTRIM) 400-80 MG tablet TAKE 1 TABLET BY MOUTH EVERY MONDAY, WEDNESDAY, FRIDAY. 36 tablet 3   tacrolimus (PROGRAF) 1 MG capsule Take 1 capsule (1 mg total) by mouth 2 times daily. 60 capsule 5   tadalafil, PAH, (ADCIRCA) 20 MG tablet TAKE 1 TABLET (20 MG TOTAL) BY MOUTH EVERY OTHER DAY. 60 tablet 1   tamsulosin (FLOMAX) 0.4 MG CAPS capsule TAKE 1 CAPSULE BY MOUTH DAILY. 90 capsule 3   torsemide (DEMADEX) 100 MG tablet Take 1 tablet (100 mg total) by mouth 2 (two) times daily. 60 tablet 4   vitamin C (ASCORBIC ACID) 250 MG tablet Take 1 tablet (250 mg total) by mouth daily. 30 tablet 0   zinc sulfate 220 (50 Zn) MG capsule Take 220 mg by mouth daily.     benzonatate (TESSALON) 200 MG capsule Take 1 capsule (200 mg total) by mouth 3 (three) times daily as needed for cough. (Patient not taking: No sig reported) 40 capsule 1   FERREX 150 150 MG capsule Take 150 mg by mouth daily.      ketoconazole (NIZORAL) 2 % shampoo Lather onto skin and leave on for 5 minutes before rinsing once daily until clear, then once a week for maintenance 120 mL 11   oxyCODONE (OXY IR/ROXICODONE) 5 MG immediate release tablet Take 1 tablet by mouth every 6 hours 30 tablet 0   predniSONE (DELTASONE) 10 MG tablet Take 4 tablets (40 mg) by mouth once daily for 3 days, 3 tablets (30 mg) daily for 3 days, 2 tablets (20 mg) daily for 3 days, 1 tablet (10 mg) daily for 3 days. 30 tablet 0   tacrolimus (PROGRAF) 0.5 MG capsule TAKE 1 CAPSULE (0.5 MG TOTAL) BY MOUTH EVERY EVENING. (Patient taking differently: Take 0.5-1.5 mg by mouth 2 (two) times daily. 1.47m in am and 0.570min evenings.) 30 capsule 5   tacrolimus (PROGRAF) 1 MG capsule Take 2 capsules (2 mg total) by mouth 2 times daily. (Patient not taking: Reported on 12/20/2020) 120 capsule 5   No  facility-administered medications prior to visit.     Per HPI unless specifically indicated in ROS section below Review of Systems  Constitutional:  Positive for fatigue. Negative for chills and fever.  HENT:  Positive for congestion, postnasal drip, rhinorrhea and sore throat. Negative for ear pain (Fullness bilateral).   Respiratory:  Positive for cough. Negative for shortness of breath.   Cardiovascular:  Negative for chest pain.  Gastrointestinal:  Negative for diarrhea, nausea and vomiting.  Musculoskeletal:  Positive for myalgias.  Neurological:  Negative for headaches.  Objective:  BP (!) 155/88   Pulse  82   Temp 98.5 F (36.9 C)   Wt Readings from Last 3 Encounters:  11/20/20 263 lb 14.3 oz (119.7 kg)  11/14/20 264 lb (119.7 kg)  10/30/20 265 lb 9.6 oz (120.5 kg)      Physical exam: Gen: alert, NAD, not ill appearing Pulm: speaks in complete sentences without increased work of breathing Psych: normal mood, normal thought content      Results for orders placed or performed during the hospital encounter of 08/71/99  Basic metabolic panel  Result Value Ref Range   Sodium 140 135 - 145 mmol/L   Potassium 3.3 (L) 3.5 - 5.1 mmol/L   Chloride 95 (L) 98 - 111 mmol/L   CO2 36 (H) 22 - 32 mmol/L   Glucose, Bld 172 (H) 70 - 99 mg/dL   BUN 42 (H) 8 - 23 mg/dL   Creatinine, Ser 1.64 (H) 0.61 - 1.24 mg/dL   Calcium 9.1 8.9 - 10.3 mg/dL   GFR, Estimated 47 (L) >60 mL/min   Anion gap 9 5 - 15  Brain natriuretic peptide  Result Value Ref Range   B Natriuretic Peptide 171.2 (H) 0.0 - 100.0 pg/mL   *Note: Due to a large number of results and/or encounters for the requested time period, some results have not been displayed. A complete set of results can be found in Results Review.   Assessment & Plan:   Problem List Items Addressed This Visit       Other   Sore throat - Primary    Patient states that he was recently at the beach.  Started having symptoms earlier yesterday.   Has not taken a home COVID test yet.  Given patient's immunosuppressed system due to medications for kidney transplant will test patient for COVID, strep, flu.  Patient has not been use anything over-the-counter as of yet did recommend Flonase which is already prescribed.  Pending test results      Relevant Orders   Novel Coronavirus, NAA ([facility])   Rapid Strep A   Body aches    Patient states that he was recently at the beach.  Started having symptoms earlier yesterday.  Has not taken a home COVID test yet.  Given patient's immunosuppressed system due to medications for kidney transplant will test patient for COVID, strep, flu.  Patient has not been use anything over-the-counter as of yet did recommend Flonase which is already prescribed.  Pending test results      Relevant Orders   Novel Coronavirus, NAA ([facility])   Influenza A/B     No orders of the defined types were placed in this encounter.  No orders of the defined types were placed in this encounter.   I discussed the assessment and treatment plan with the patient. The patient was provided an opportunity to ask questions and all were answered. The patient agreed with the plan and demonstrated an understanding of the instructions. The patient was advised to call back or seek an in-person evaluation if the symptoms worsen or if the condition fails to improve as anticipated.  Follow up plan: No follow-ups on file.  Romilda Garret, NP

## 2021-01-01 NOTE — Progress Notes (Signed)
Attempted to call patient and unable to reach and left a message with results per [facility]

## 2021-01-01 NOTE — Assessment & Plan Note (Signed)
Patient states that he was [place] the beach.  Started having symptoms earlier yesterday.  Has not taken a home COVID test yet.  Given patient's immunosuppressed system due to medications for kidney transplant will test patient for COVID, strep, flu.  Patient has not been use anything over-the-counter as of yet did recommend Flonase which is already prescribed.  Pending test results

## 2021-01-01 NOTE — Assessment & Plan Note (Signed)
Patient states that he was recently at the beach.  Started having symptoms earlier yesterday.  Has not taken a home COVID test yet.  Given patient's immunosuppressed system due to medications for kidney transplant will test patient for COVID, strep, flu.  Patient has not been use anything over-the-counter as of yet did recommend Flonase which is already prescribed.  Pending test results 

## 2021-01-02 ENCOUNTER — Telehealth: Payer: Self-pay

## 2021-01-02 ENCOUNTER — Other Ambulatory Visit: Payer: Self-pay | Admitting: Nurse Practitioner

## 2021-01-02 ENCOUNTER — Other Ambulatory Visit: Payer: Self-pay

## 2021-01-02 DIAGNOSIS — J45998 Other asthma: Secondary | ICD-10-CM

## 2021-01-02 DIAGNOSIS — R051 Acute cough: Secondary | ICD-10-CM

## 2021-01-02 LAB — SPECIMEN STATUS REPORT

## 2021-01-02 LAB — SARS-COV-2, NAA 2 DAY TAT

## 2021-01-02 LAB — NOVEL CORONAVIRUS, NAA: SARS-CoV-2, NAA: NOT DETECTED

## 2021-01-02 MED ORDER — ALBUTEROL SULFATE (2.5 MG/3ML) 0.083% IN NEBU
2.5000 mg | INHALATION_SOLUTION | Freq: Four times a day (QID) | RESPIRATORY_TRACT | 0 refills | Status: DC | PRN
  Filled 2021-01-02: qty 150, 14d supply, fill #0

## 2021-01-02 MED ORDER — GUAIFENESIN-CODEINE 100-10 MG/5ML PO SOLN
5.0000 mL | Freq: Three times a day (TID) | ORAL | 0 refills | Status: AC | PRN
  Filled 2021-01-02: qty 75, 5d supply, fill #0

## 2021-01-02 NOTE — Telephone Encounter (Signed)
Pt was seen by [facility] yesterday and he is Covid positive. Says the cough is really bad and kept him up all night. Would like a rx for a cough syrup with codeine or something that will help him sleep. He also needs a refill on his nebulizer medication. I have added that to this message. Please send to [place].

## 2021-01-02 NOTE — Telephone Encounter (Signed)
Spoke to pt. He may have misunderstood whoever called him. He thought they said he was Covid positive. I advised him he is strep positive. He appreciated the cough syrup. He will get the antibiotic also.

## 2021-01-03 ENCOUNTER — Telehealth: Payer: Self-pay | Admitting: Internal Medicine

## 2021-01-03 NOTE — Telephone Encounter (Signed)
Pt is returning his call, he's not sure about what. It may be pertaining to his call yesterday. Please advise

## 2021-01-04 NOTE — Telephone Encounter (Signed)
I called patient on 01/03/21 and 01/04/21 and left detailed message on his voicemail (not able to get in touch with patient or his wife otherwise) we wanted to confirm that patient was aware that Strep was positive and that antibiotic was sent in on 01/01/21 and that patient has started this.  Asked patient to call back to confirm that he received this message

## 2021-01-08 DIAGNOSIS — M25562 Pain in left knee: Secondary | ICD-10-CM | POA: Diagnosis not present

## 2021-01-08 DIAGNOSIS — M7652 Patellar tendinitis, left knee: Secondary | ICD-10-CM | POA: Diagnosis not present

## 2021-01-09 ENCOUNTER — Encounter: Payer: 59 | Admitting: Psychology

## 2021-01-09 DIAGNOSIS — G4733 Obstructive sleep apnea (adult) (pediatric): Secondary | ICD-10-CM | POA: Diagnosis not present

## 2021-01-09 NOTE — Telephone Encounter (Signed)
I spoke to him last week. There is another message. He thought he had Covid but I told him he had strep and that [facility] had sent in an antibiotic.

## 2021-01-10 ENCOUNTER — Other Ambulatory Visit: Payer: Self-pay

## 2021-01-12 DIAGNOSIS — Z7960 Long term (current) use of unspecified immunomodulators and immunosuppressants: Secondary | ICD-10-CM | POA: Diagnosis not present

## 2021-01-12 DIAGNOSIS — D849 Immunodeficiency, unspecified: Secondary | ICD-10-CM | POA: Diagnosis not present

## 2021-01-12 DIAGNOSIS — Z79621 Long term (current) use of calcineurin inhibitor: Secondary | ICD-10-CM | POA: Diagnosis not present

## 2021-01-12 DIAGNOSIS — Z79899 Other long term (current) drug therapy: Secondary | ICD-10-CM | POA: Diagnosis not present

## 2021-01-12 DIAGNOSIS — E876 Hypokalemia: Secondary | ICD-10-CM | POA: Diagnosis not present

## 2021-01-12 DIAGNOSIS — R600 Localized edema: Secondary | ICD-10-CM | POA: Diagnosis not present

## 2021-01-12 DIAGNOSIS — Z5181 Encounter for therapeutic drug level monitoring: Secondary | ICD-10-CM | POA: Diagnosis not present

## 2021-01-12 DIAGNOSIS — Z940 Kidney transplant status: Secondary | ICD-10-CM | POA: Diagnosis not present

## 2021-01-12 DIAGNOSIS — I10 Essential (primary) hypertension: Secondary | ICD-10-CM | POA: Diagnosis not present

## 2021-01-15 NOTE — Progress Notes (Deleted)
01/16/21- 52 yoM former cigar smoker for sleep evaluation with concern of OSA and Asthma, courtesy of B. Volanda Napoleon, NP Medical problem list includes Superficial Thrombophlebitis, Pulm HTN, AFib, HTN, CAD, Chronic Venous Insufficiency, Stasis Dermatitis, s&dCHF, Covid infection 2021, Asthma mod-persistent, GERD, Diverticulosis, DM2, Gout, ESRD/ kidney transplant, Obesity/ Bariatric Surgery 2016, Obesity Hypoventilation, Depression, NPSG/Split [facility] 03/15/12- AHI 57.9/ hr, desaturation to 89.6%, body weight 310 lbs, CPAP to 11  O2- BiPAP-14/9 PS 4/ [employer] Download- Epworth score- Body weight today- Covid vax- Flu vax-

## 2021-01-16 ENCOUNTER — Ambulatory Visit: Payer: 59 | Admitting: Internal Medicine

## 2021-01-17 ENCOUNTER — Telehealth (HOSPITAL_COMMUNITY): Payer: Self-pay

## 2021-01-17 NOTE — Telephone Encounter (Signed)
Attempted to contact, no answer.  Have left a few messages in past week with no return call.  Will continue to try to reach.   [place] [phone]

## 2021-01-17 NOTE — Telephone Encounter (Signed)
[name] contacted me and advised he has been doing ok.  He keeps banging his chins on things and busted them open.  Advised a non cling pad on wound with cling.  He denies any problems such as chest pain, headaches, dizziness or increased shortness of breath.  He states seen his kidney doctor and everything was looking good on his blood work.  He states weight is staying down and legs are not swollen.  He stays active around the home and traveling.  He is aware of up coming appts.  He has all his medications and aware of how to take them.  He is aware to call with weight gain.  He cooks a lot of his food, he watches high sodium foods and watches how much fluids he intakes daily.  Will continue to visit for heart failure, diet and medication compliance.   [place] [phone]

## 2021-01-22 ENCOUNTER — Other Ambulatory Visit: Payer: Self-pay

## 2021-01-22 MED ORDER — TACROLIMUS 1 MG PO CAPS
ORAL_CAPSULE | ORAL | 5 refills | Status: DC
  Filled 2021-01-22 (×2): qty 120, 30d supply, fill #0
  Filled 2021-02-22: qty 120, 30d supply, fill #1
  Filled 2021-03-20: qty 120, 30d supply, fill #2

## 2021-01-24 ENCOUNTER — Other Ambulatory Visit (HOSPITAL_COMMUNITY): Payer: Self-pay

## 2021-01-24 ENCOUNTER — Encounter (HOSPITAL_COMMUNITY): Payer: Self-pay

## 2021-01-24 NOTE — Progress Notes (Signed)
Today had a home visit with [name].  He appears to be doing well with Heart failure.  He states urinating a lot.  He states weight is up a little today but he knows what he ate yesterday was high in sodium.  He states will watch closely today.  He states mostly watching high sodium foods and watching how much fluids he is consuming.  His legs are really dark and has some large wounds that are healing.  Advised him to show cardiology to see if maybe he needs to see vascular, he advised he has not been to them in several years.  He states has been taking all his meds and he has all of them.  He misses his grand baby that was living with him and now they have moved to their home.  He states having problems with depression, he states may try counseling but does not want his wife to know, advised him to call them and told him to tell them.  He does not want his wife to worry about him.  He was very talkative today about a lot of things.  He is worried about his health.  He has everything for daily living.  He is planning his holidays.  He has immediate family and his wifes family close by.  He loves to travel. He is aware of up coming appts.  Will continue to visit for heart failure, diet and medication management.   [place] [phone]

## 2021-01-25 ENCOUNTER — Other Ambulatory Visit: Payer: Self-pay | Admitting: Cardiovascular Disease

## 2021-01-25 ENCOUNTER — Other Ambulatory Visit: Payer: Self-pay

## 2021-01-25 DIAGNOSIS — E662 Morbid (severe) obesity with alveolar hypoventilation: Secondary | ICD-10-CM | POA: Diagnosis not present

## 2021-01-25 DIAGNOSIS — G4733 Obstructive sleep apnea (adult) (pediatric): Secondary | ICD-10-CM | POA: Diagnosis not present

## 2021-01-25 MED FILL — Apixaban Tab 5 MG: ORAL | 90 days supply | Qty: 180 | Fill #0 | Status: AC

## 2021-01-25 MED FILL — Dapagliflozin Propanediol Tab 10 MG (Base Equivalent): ORAL | 30 days supply | Qty: 30 | Fill #2 | Status: AC

## 2021-01-25 NOTE — Telephone Encounter (Signed)
Refill Request.

## 2021-01-25 NOTE — Telephone Encounter (Signed)
Prescription refill request for Eliquis received. Indication: Atrial fib Last office visit: 10/30/20  Rudi Heap FNP Scr: 1.40 on 01/12/21 Age: 60 Weight: 120.5kg  Based on above findings Eliquis 53m twice daily is the appropriate dose.  Refill approved.

## 2021-01-26 ENCOUNTER — Other Ambulatory Visit: Payer: Self-pay

## 2021-01-28 DIAGNOSIS — E1165 Type 2 diabetes mellitus with hyperglycemia: Secondary | ICD-10-CM | POA: Diagnosis not present

## 2021-01-29 ENCOUNTER — Other Ambulatory Visit: Payer: Self-pay

## 2021-02-01 ENCOUNTER — Other Ambulatory Visit: Payer: Self-pay

## 2021-02-02 ENCOUNTER — Other Ambulatory Visit: Payer: Self-pay

## 2021-02-08 ENCOUNTER — Other Ambulatory Visit: Payer: Self-pay | Admitting: Cardiovascular Disease

## 2021-02-08 ENCOUNTER — Other Ambulatory Visit: Payer: Self-pay

## 2021-02-08 NOTE — Telephone Encounter (Signed)
Please contact pt for future appointment. Pt due for 6 month f/u.

## 2021-02-09 ENCOUNTER — Other Ambulatory Visit: Payer: Self-pay

## 2021-02-09 DIAGNOSIS — G4733 Obstructive sleep apnea (adult) (pediatric): Secondary | ICD-10-CM | POA: Diagnosis not present

## 2021-02-12 ENCOUNTER — Other Ambulatory Visit: Payer: Self-pay

## 2021-02-12 ENCOUNTER — Other Ambulatory Visit: Payer: Self-pay | Admitting: Cardiovascular Disease

## 2021-02-12 MED FILL — Metolazone Tab 2.5 MG: ORAL | 35 days supply | Qty: 10 | Fill #0 | Status: CN

## 2021-02-12 MED FILL — Metolazone Tab 2.5 MG: ORAL | 35 days supply | Qty: 10 | Fill #0 | Status: AC

## 2021-02-22 ENCOUNTER — Other Ambulatory Visit: Payer: Self-pay

## 2021-02-22 DIAGNOSIS — Z4822 Encounter for aftercare following kidney transplant: Secondary | ICD-10-CM | POA: Diagnosis not present

## 2021-02-22 MED FILL — Bupropion HCl Tab ER 12HR 150 MG: ORAL | 90 days supply | Qty: 180 | Fill #2 | Status: AC

## 2021-02-22 MED FILL — Dapagliflozin Propanediol Tab 10 MG (Base Equivalent): ORAL | 30 days supply | Qty: 30 | Fill #3 | Status: AC

## 2021-02-22 MED FILL — Tamsulosin HCl Cap 0.4 MG: ORAL | 90 days supply | Qty: 90 | Fill #2 | Status: AC

## 2021-02-23 ENCOUNTER — Other Ambulatory Visit: Payer: Self-pay

## 2021-02-23 DIAGNOSIS — E876 Hypokalemia: Secondary | ICD-10-CM | POA: Diagnosis not present

## 2021-02-23 DIAGNOSIS — E1165 Type 2 diabetes mellitus with hyperglycemia: Secondary | ICD-10-CM | POA: Diagnosis not present

## 2021-02-23 DIAGNOSIS — E8881 Metabolic syndrome: Secondary | ICD-10-CM | POA: Diagnosis not present

## 2021-02-23 DIAGNOSIS — D631 Anemia in chronic kidney disease: Secondary | ICD-10-CM | POA: Diagnosis not present

## 2021-02-23 DIAGNOSIS — I5020 Unspecified systolic (congestive) heart failure: Secondary | ICD-10-CM | POA: Diagnosis not present

## 2021-02-23 DIAGNOSIS — N400 Enlarged prostate without lower urinary tract symptoms: Secondary | ICD-10-CM | POA: Diagnosis not present

## 2021-02-23 DIAGNOSIS — M109 Gout, unspecified: Secondary | ICD-10-CM | POA: Diagnosis not present

## 2021-02-23 DIAGNOSIS — D84821 Immunodeficiency due to drugs: Secondary | ICD-10-CM | POA: Diagnosis not present

## 2021-02-23 DIAGNOSIS — Z9989 Dependence on other enabling machines and devices: Secondary | ICD-10-CM | POA: Diagnosis not present

## 2021-02-23 DIAGNOSIS — I5040 Unspecified combined systolic (congestive) and diastolic (congestive) heart failure: Secondary | ICD-10-CM | POA: Diagnosis not present

## 2021-02-23 DIAGNOSIS — Z79899 Other long term (current) drug therapy: Secondary | ICD-10-CM | POA: Diagnosis not present

## 2021-02-23 DIAGNOSIS — I482 Chronic atrial fibrillation, unspecified: Secondary | ICD-10-CM | POA: Diagnosis not present

## 2021-02-23 DIAGNOSIS — I5030 Unspecified diastolic (congestive) heart failure: Secondary | ICD-10-CM | POA: Diagnosis not present

## 2021-02-23 DIAGNOSIS — Z79621 Long term (current) use of calcineurin inhibitor: Secondary | ICD-10-CM | POA: Diagnosis not present

## 2021-02-23 DIAGNOSIS — E1122 Type 2 diabetes mellitus with diabetic chronic kidney disease: Secondary | ICD-10-CM | POA: Diagnosis not present

## 2021-02-23 DIAGNOSIS — Z794 Long term (current) use of insulin: Secondary | ICD-10-CM | POA: Diagnosis not present

## 2021-02-23 DIAGNOSIS — E669 Obesity, unspecified: Secondary | ICD-10-CM | POA: Diagnosis not present

## 2021-02-23 DIAGNOSIS — D509 Iron deficiency anemia, unspecified: Secondary | ICD-10-CM | POA: Diagnosis not present

## 2021-02-23 DIAGNOSIS — I25810 Atherosclerosis of coronary artery bypass graft(s) without angina pectoris: Secondary | ICD-10-CM | POA: Diagnosis not present

## 2021-02-23 DIAGNOSIS — I110 Hypertensive heart disease with heart failure: Secondary | ICD-10-CM | POA: Diagnosis not present

## 2021-02-23 DIAGNOSIS — R809 Proteinuria, unspecified: Secondary | ICD-10-CM | POA: Diagnosis not present

## 2021-02-23 DIAGNOSIS — Z940 Kidney transplant status: Secondary | ICD-10-CM | POA: Diagnosis not present

## 2021-02-23 DIAGNOSIS — Z792 Long term (current) use of antibiotics: Secondary | ICD-10-CM | POA: Diagnosis not present

## 2021-02-23 DIAGNOSIS — K219 Gastro-esophageal reflux disease without esophagitis: Secondary | ICD-10-CM | POA: Diagnosis not present

## 2021-02-23 DIAGNOSIS — I272 Pulmonary hypertension, unspecified: Secondary | ICD-10-CM | POA: Diagnosis not present

## 2021-02-23 DIAGNOSIS — N1832 Chronic kidney disease, stage 3b: Secondary | ICD-10-CM | POA: Diagnosis not present

## 2021-02-23 DIAGNOSIS — Z7901 Long term (current) use of anticoagulants: Secondary | ICD-10-CM | POA: Diagnosis not present

## 2021-02-23 DIAGNOSIS — Z4822 Encounter for aftercare following kidney transplant: Secondary | ICD-10-CM | POA: Diagnosis not present

## 2021-02-23 DIAGNOSIS — I130 Hypertensive heart and chronic kidney disease with heart failure and stage 1 through stage 4 chronic kidney disease, or unspecified chronic kidney disease: Secondary | ICD-10-CM | POA: Diagnosis not present

## 2021-02-23 MED ORDER — FEBUXOSTAT 80 MG PO TABS
ORAL_TABLET | ORAL | 1 refills | Status: DC
  Filled 2021-02-23: qty 90, 90d supply, fill #0
  Filled 2021-05-28: qty 90, 90d supply, fill #1

## 2021-02-24 DIAGNOSIS — G4733 Obstructive sleep apnea (adult) (pediatric): Secondary | ICD-10-CM | POA: Diagnosis not present

## 2021-02-24 DIAGNOSIS — E662 Morbid (severe) obesity with alveolar hypoventilation: Secondary | ICD-10-CM | POA: Diagnosis not present

## 2021-02-26 ENCOUNTER — Other Ambulatory Visit: Payer: Self-pay

## 2021-02-27 DIAGNOSIS — E1165 Type 2 diabetes mellitus with hyperglycemia: Secondary | ICD-10-CM | POA: Diagnosis not present

## 2021-03-01 ENCOUNTER — Other Ambulatory Visit
Admission: RE | Admit: 2021-03-01 | Discharge: 2021-03-01 | Disposition: A | Discharge status: Home / Self Care | Payer: 59 | Attending: Nurse Practitioner | Admitting: Nurse Practitioner

## 2021-03-01 ENCOUNTER — Other Ambulatory Visit: Payer: Self-pay

## 2021-03-01 ENCOUNTER — Encounter: Payer: Self-pay | Admitting: Nurse Practitioner

## 2021-03-01 ENCOUNTER — Ambulatory Visit (INDEPENDENT_AMBULATORY_CARE_PROVIDER_SITE_OTHER): Payer: 59 | Admitting: Nurse Practitioner

## 2021-03-01 VITALS — BP 160/88 | HR 60 | Ht 70.0 in | Wt 268.0 lb

## 2021-03-01 DIAGNOSIS — I2510 Atherosclerotic heart disease of native coronary artery without angina pectoris: Secondary | ICD-10-CM | POA: Diagnosis not present

## 2021-03-01 DIAGNOSIS — I482 Chronic atrial fibrillation, unspecified: Secondary | ICD-10-CM | POA: Insufficient documentation

## 2021-03-01 DIAGNOSIS — I4821 Permanent atrial fibrillation: Secondary | ICD-10-CM | POA: Diagnosis not present

## 2021-03-01 DIAGNOSIS — N183 Chronic kidney disease, stage 3 unspecified: Secondary | ICD-10-CM

## 2021-03-01 DIAGNOSIS — E1121 Type 2 diabetes mellitus with diabetic nephropathy: Secondary | ICD-10-CM

## 2021-03-01 DIAGNOSIS — I5032 Chronic diastolic (congestive) heart failure: Secondary | ICD-10-CM | POA: Diagnosis not present

## 2021-03-01 DIAGNOSIS — R079 Chest pain, unspecified: Secondary | ICD-10-CM

## 2021-03-01 DIAGNOSIS — E785 Hyperlipidemia, unspecified: Secondary | ICD-10-CM

## 2021-03-01 DIAGNOSIS — I255 Ischemic cardiomyopathy: Secondary | ICD-10-CM | POA: Diagnosis not present

## 2021-03-01 DIAGNOSIS — I5042 Chronic combined systolic (congestive) and diastolic (congestive) heart failure: Secondary | ICD-10-CM | POA: Diagnosis not present

## 2021-03-01 DIAGNOSIS — I10 Essential (primary) hypertension: Secondary | ICD-10-CM | POA: Diagnosis not present

## 2021-03-01 DIAGNOSIS — Z794 Long term (current) use of insulin: Secondary | ICD-10-CM

## 2021-03-01 LAB — BASIC METABOLIC PANEL
Anion gap: 10 (ref 5–15)
BUN: 53 mg/dL — ABNORMAL HIGH (ref 8–23)
CO2: 33 mmol/L — ABNORMAL HIGH (ref 22–32)
Calcium: 9.5 mg/dL (ref 8.9–10.3)
Chloride: 97 mmol/L — ABNORMAL LOW (ref 98–111)
Creatinine, Ser: 1.79 mg/dL — ABNORMAL HIGH (ref 0.61–1.24)
GFR, Estimated: 43 mL/min — ABNORMAL LOW (ref >60)
Glucose, Bld: 127 mg/dL — ABNORMAL HIGH (ref 70–99)
Potassium: 3.7 mmol/L (ref 3.5–5.1)
Sodium: 140 mmol/L (ref 135–145)

## 2021-03-01 MED ORDER — LOSARTAN POTASSIUM 25 MG PO TABS
12.5000 mg | ORAL_TABLET | Freq: Every day | ORAL | 3 refills | Status: DC
  Filled 2021-03-01: qty 45, 90d supply, fill #0

## 2021-03-01 NOTE — Patient Instructions (Signed)
Medication Instructions:  Your physician has recommended you make the following change in your medication:   START Losartan 12.5 mg once a day  *If you need a refill on your cardiac medications before your next appointment, please call your pharmacy*   Lab Work: BMET today over at [employer] in one week here in our office.  If you have labs (blood work) drawn today and your tests are completely normal, you will receive your results only by: [place] (if you have MyChart) OR A paper copy in the mail If you have any lab test that is abnormal or we need to change your treatment, we will call you to review the results.   Testing/Procedures: [place]  Your caregiver has ordered a Stress Test with nuclear imaging. The purpose of this test is to evaluate the blood supply to your heart muscle. This procedure is referred to as a "Non-Invasive Stress Test." This is because other than having an IV started in your vein, nothing is inserted or "invades" your body. Cardiac stress tests are done to find areas of poor blood flow to the heart by determining the extent of coronary artery disease (CAD). Some patients exercise on a treadmill, which naturally increases the blood flow to your heart, while others who are  unable to walk on a treadmill due to physical limitations have a pharmacologic/chemical stress agent called Lexiscan . This medicine will mimic walking on a treadmill by temporarily increasing your coronary blood flow.   Please note: these test may take anywhere between 2-4 hours to complete  PLEASE REPORT TO [place] AT THE FIRST DESK WILL DIRECT YOU WHERE TO GO  Date of Procedure:_____________________________________  Arrival Time for Procedure:______________________________  Instructions regarding medication:   _XX___ : Hold diabetes medication morning of procedure  _XX___:  Hold other medications as follows: HOLD torsemide and  potassium the morning of your test. DECREASE insulin by half the night before as well.   PLEASE NOTIFY THE OFFICE AT LEAST 60 HOURS IN ADVANCE IF YOU ARE UNABLE TO KEEP YOUR APPOINTMENT.  [phone] AND  PLEASE NOTIFY NUCLEAR MEDICINE AT [facility] AT LEAST 24 HOURS IN ADVANCE IF YOU ARE UNABLE TO KEEP YOUR APPOINTMENT. [phone]  How to prepare for your Myoview test:  Do not eat or drink after midnight No caffeine for 24 hours prior to test No smoking 24 hours prior to test. Your medication may be taken with water.  If your doctor stopped a medication because of this test, do not take that medication. Ladies, please do not wear dresses.  Skirts or pants are appropriate. Please wear a short sleeve shirt. No perfume, cologne or lotion. Wear comfortable walking shoes. No heels!    Follow-Up: At [facility], you and your health needs are our priority.  As part of our continuing mission to provide you with exceptional heart care, we have created designated Provider Care Teams.  These Care Teams include your primary Cardiologist (physician) and Advanced Practice Providers (APPs -  Physician Assistants and Nurse Practitioners) who all work together to provide you with the care you need, when you need it.   Your next appointment:   1 month(s)  The format for your next appointment:   In Person  Provider:   [name], MD or Murray Hodgkins, NP

## 2021-03-01 NOTE — Addendum Note (Signed)
Addended by: Rogelia Mire on: 03/01/2021 01:36 PM   Modules accepted: Orders

## 2021-03-01 NOTE — Progress Notes (Signed)
Office Visit    Patient Name: [name]. Date of Encounter: 03/01/2021  Primary Care Provider:  [name], MD Primary Cardiologist:  [name], MD  Chief Complaint    60 year old male with a history of CAD status post CABG in 2005, chronic combined systolic diastolic congestive heart failure, ischemic cardiomyopathy, permanent atrial fibrillation on Eliquis, pulmonary arterial and venous hypertension, end-stage renal disease status post renal transplant, anemia of chronic disease, type 2 diabetes mellitus, hypertension, hyperlipidemia, beta-blocker induced bradycardia, obesity status post gastric bypass, and obstructive sleep apnea, who presents for follow-up related to heart failure.  Past Medical History    Past Medical History:  Diagnosis Date   Allergy    seasonal   Amnestic MCI (mild cognitive impairment with memory loss)    Asthma    Benign neoplasm of colon    CAD (coronary artery disease) 2005   a.  Status post four-vessel CABG in 2005; b. 03/2018 MV: Small, fixed inferoapical and apical septal defect. No ischemia. EF 47% (60-65% by 05/2018 Echo).   Chronic atrial fibrillation ([name])    a. CHADS2VASc 4 (CHF, HTN, DM, vascular disease)--> Eliquis; b. 09/2018 Zio: Chronic Afib.   Chronic combined systolic and diastolic CHF (congestive heart failure) ([place])    a.  7/18 Echo: EF 45-50%; b. 05/2018 Echo: EF 60-65%, RVSP 74.8; c. 12/2018 Echo: EF 50-55%. Sev dil LA; d. 04/2019 Echo: EF 45-50%, glob HK. Mod reduced RV fxn. RVSP 53.41mHg.   Chronic venous stasis dermatitis of both lower extremities    Cytomegaloviral disease ([place] 2017   Depression    Diabetes mellitus    Diverticulosis    Dyspnea    Erectile dysfunction    ESRD (end stage renal disease) ([place] 2005   a. HD 2004-2012; b. S/p renal transplant in 2012   GERD (gastroesophageal reflux disease)    Gout    Hyperlipidemia    low since renal failure   Hypertension    Kidney transplant status,  cadaveric 2012   Obesity    PAH (pulmonary artery hypertension) ([facility]    a. 05/2018 Echo: RVSP 74.84mg; b. 12/2018 RV not well visualized; c. 04/2019 RHC: RA 19, RV 80/20, PA 78/31 (51), PCWP 25, CO/CI 7.69/3.26. PVR 3.25 WU-->Sev PAH, likely primarily PV HTN.   Pneumonia    Purpura ([place]   Sleep apnea    bipap with oxygen 2 l   Trigger finger    Ulcer 05/2016   Left shin   Wears glasses    Past Surgical History:  Procedure Laterality Date   BARIATRIC SURGERY     [facility]  [facility]   CATARACT EXTRACTION W/PHACO Right 10/29/2017   Procedure: CATARACT EXTRACTION PHACO AND INTRAOCULAR LENS PLACEMENT ([place] RIGHT DIABETIC;  Surgeon: BrLeandrew KoyanagiMD;  Location: [facility] Service: Ophthalmology;  Laterality: Right;  Diabetic - insulin   CATARACT EXTRACTION W/PHACO Left 11/18/2017   Procedure: CATARACT EXTRACTION PHACO AND INTRAOCULAR LENS PLACEMENT ([place] IVA/TOPICAL;  Surgeon: BrLeandrew KoyanagiMD;  [place]: [facility] Service: Ophthalmology;  Laterality: Left;  DIABETES - insulin sleep apnea   COLONOSCOPY WITH PROPOFOL N/A 04/22/2013   Procedure: COLONOSCOPY WITH PROPOFOL;  Surgeon: DaMilus BanisterMD;  [facility]: [facility];  Service: Endoscopy;  Laterality: N/A;   CORONARY ARTERY BYPASS GRAFT  2005   X 4   DG ANGIO AV SHUNT*L*     right and left  upper arms   FASCIOTOMY  03/03/2012   Procedure: FASCIOTOMY;  Surgeon: [name], MD;  Location: [place];  Service: Orthopedics;  Laterality: Right;  FASCIOTOMY RIGHT SMALL FINGER   FASCIOTOMY Left 08/17/2013   Procedure: FASCIOTOMY LEFT RING;  Surgeon: [name], MD;  Location: [place];  Service: Orthopedics;  Laterality: Left;   INCISION AND DRAINAGE ABSCESS Left 10/15/2015   Procedure: INCISION AND DRAINAGE ABSCESS;  Surgeon: [name], MD;  Location: [facility] ORS;  Service: General;  Laterality: Left;    KIDNEY TRANSPLANT  09/13/2010   cadaver--at [facility] HEART CATH N/A 11/15/2016   Procedure: RIGHT HEART CATH;  Surgeon: [name], MD;  Location: [place] CV LAB;  Service: Cardiovascular;  Laterality: N/A;   RIGHT HEART CATH N/A 05/03/2019   Procedure: RIGHT HEART CATH;  Surgeon: [name], MD;  Location: [place] CV LAB;  Service: Cardiovascular;  Laterality: N/A;   TYMPANIC MEMBRANE REPAIR  1/12   left   VASECTOMY      Allergies  Allergies  Allergen Reactions   Contrast Media [Iodinated Diagnostic Agents] Other (See Comments)    Kidney transplant   Ibuprofen Other (See Comments)    Due to kidney transplant Due to kidney transplant Due to kidney transplant    History of Present Illness    60-year-old male with a history of CAD status post CABG in 2005, chronic combined systolic diastolic congestive heart failure, ischemic cardiomyopathy, permanent atrial fibrillation on Eliquis, pulmonary arterial and venous hypertension, end-stage renal disease status post renal transplant, anemia of chronic disease, type 2 diabetes mellitus, hypertension, hyperlipidemia, beta-blocker induced bradycardia, obesity status post gastric bypass, and obstructive sleep apnea.  He is status post four-vessel bypass in 2005 with nonischemic stress testing in January 2020.  EF was previously as low as 35 to 40% however, in July 2018, EF improved to 45 to 50%.  Right heart catheterization in August 2018 in the setting of admission for volume overload showed significant pulmonary hypertension with a PA of 94/38 (59).  He has been followed by Dr. Marigene Ehlers in pulmonary hypertension clinic in [place] and has been managed with tadalafil.  He was admitted 04/2019 w/ CHF.  Echo showed EF 45-50%.  RHC showed severe PAH w/ PA 78/31 (51), PCWP 25, CO/CI 7.69/3.26. PVR 3.25 WU suggestive of  severe PAH and likely primarily PV HTN.  He was admitted in April 2021 with left arm pain and was found to have  an occluded cephalic vein at the AV fistula outflow tract in the antecubital fossa with no other thrombus identified.  He was seen by vascular surgery and continued on Eliquis.  [name] was last seen in heart failure clinic in August, at which time his volume was stable.  He has since been followed closely by the community paramedic, and was last seen in home on November 2.  He has done well since his last in office visit.  His weight is mostly stable at home, trending around 263 to 265 pounds.  He has noticed about once every 2 weeks, that upon awakening, he has tightness across his chest without associated symptoms.  This typically last about 5 minutes and resolve spontaneously.  He does not think that it changes with palpation or position changes and is concerned that he might be having recurrent angina.  Interestingly, he is fairly busy throughout the day doing assorted jobs around his home currently replacing doors and  windows, and is able to tolerate that level of exertion without any chest pain or dyspnea.  His blood pressure has been trending in the 150s and above at home.  He denies palpitations, PND, orthopnea, dizziness, syncope, or early satiety.  He notes chronic, stable trace lower extremity swelling.  Home Medications    Current Outpatient Medications  Medication Sig Dispense Refill   apixaban (ELIQUIS) 5 MG TABS tablet TAKE 1 TABLET BY MOUTH TWICE DAILY 180 tablet 1   buPROPion (WELLBUTRIN SR) 150 MG 12 hr tablet TAKE 1 TABLET BY MOUTH TWICE DAILY 180 tablet 3   cholecalciferol (VITAMIN D3) 25 MCG (1000 UNIT) tablet Take 1,000 Units by mouth daily.     colchicine 0.6 MG tablet Take 1 tablet (0.6 mg total) by mouth every other day 30 tablet 1   Cyanocobalamin (B-12 PO) Take by mouth.     dapagliflozin propanediol (FARXIGA) 10 MG TABS tablet TAKE 1 TABLET (10 MG TOTAL) BY MOUTH DAILY BEFORE BREAKFAST. 30 tablet 6   Febuxostat 80 MG TABS Take 1 tablet (80 mg total) by mouth once daily  for 180 days 90 tablet 1   fluticasone (FLONASE) 50 MCG/ACT nasal spray PLACE 2 SPRAYS INTO BOTH NOSTRILS DAILY. 16 g 11   gabapentin (NEURONTIN) 300 MG capsule TAKE 1 CAPSULE BY MOUTH TWICE DAILY 180 capsule 1   HUMALOG KWIKPEN 100 UNIT/ML KwikPen Inject 5-10 Units into the skin 3 (three) times daily. Sliding scale     insulin glargine-yfgn (SEMGLEE) 100 UNIT/ML Pen Inject 40 Units subcutaneously once daily 15 mL 5   iron polysaccharides (FERREX 150) 150 MG capsule TAKE 1 CAPSULE BY MOUTH 2 TIMES DAILY. 180 capsule 1   losartan (COZAAR) 25 MG tablet Take 0.5 tablets (12.5 mg total) by mouth daily. 45 tablet 3   metolazone (ZAROXOLYN) 2.5 MG tablet Take 1 tablet by mouth 2 days per week 10 tablet 0   montelukast (SINGULAIR) 10 MG tablet TAKE 1 TABLET BY MOUTH AT BEDTIME. 90 tablet 3   Multiple Vitamin (MULTIVITAMIN WITH MINERALS) TABS tablet Take 1 tablet by mouth daily.     mycophenolate (MYFORTIC) 180 MG EC tablet Take 360 mg by mouth 2 (two) times daily.     omeprazole (PRILOSEC) 20 MG capsule TAKE 1 CAPSULE BY MOUTH DAILY. 90 capsule 3   Potassium Chloride CR (MICRO-K) 8 MEQ CPCR capsule CR Take 9 capsules (72 mEq total) by mouth 2 (two) times daily. 450 capsule 3   predniSONE (DELTASONE) 5 MG tablet Take 1 tablet (5 mg total) by mouth daily. 90 tablet 3   rosuvastatin (CRESTOR) 10 MG tablet TAKE 1 TABLET BY MOUTH DAILY 90 tablet 2   sulfamethoxazole-trimethoprim (BACTRIM) 400-80 MG tablet TAKE 1 TABLET BY MOUTH EVERY MONDAY, WEDNESDAY, FRIDAY. 36 tablet 3   tacrolimus (PROGRAF) 1 MG capsule Take 2 capsules (2 mg total) by mouth 2 times daily. 120 capsule 5   tadalafil, PAH, (ADCIRCA) 20 MG tablet TAKE 1 TABLET (20 MG TOTAL) BY MOUTH EVERY OTHER DAY. 60 tablet 1   tamsulosin (FLOMAX) 0.4 MG CAPS capsule TAKE 1 CAPSULE BY MOUTH DAILY. 90 capsule 3   torsemide (DEMADEX) 100 MG tablet Take 1 tablet (100 mg total) by mouth 2 (two) times daily. 60 tablet 4   vitamin C (ASCORBIC ACID) 250 MG tablet  Take 1 tablet (250 mg total) by mouth daily. 30 tablet 0   zinc sulfate 220 (50 Zn) MG capsule Take 220 mg by mouth daily.     albuterol (  PROVENTIL) (2.5 MG/3ML) 0.083% nebulizer solution Take 3 mLs (2.5 mg total) by nebulization every 6 (six) hours as needed for wheezing or shortness of breath. (Patient not taking: Reported on 03/01/2021) 150 mL 0   albuterol (VENTOLIN HFA) 108 (90 Base) MCG/ACT inhaler Inhale 2 puffs into the lungs every 6 (six) hours as needed for wheezing or shortness of breath. (Patient not taking: Reported on 03/01/2021) 18 g 5   beclomethasone (QVAR) 80 MCG/ACT inhaler Inhale 2 puffs into the lungs 2 (two) times daily. (Patient not taking: Reported on 03/01/2021) 10.6 g 5   tacrolimus (PROGRAF) 1 MG capsule Take 1 capsule (1 mg total) by mouth 2 times daily. (Patient not taking: Reported on 03/01/2021) 60 capsule 5   No current facility-administered medications for this visit.     Review of Systems    Chronic mild lower extremity swelling which has been stable.  He has been experiencing intermittent chest discomfort occurring first thing in the morning and resolving spontaneously.  He has not any exertional chest pain or dyspnea and denies palpitations, PND, orthopnea, dizziness, syncope, or early satiety.  All other systems reviewed and are otherwise negative except as noted above.    Physical Exam    VS:  BP (!) 160/88   Pulse 60   Ht _0  (1.778 m)   Wt 268 lb (121.6 kg)   SpO2 94%   BMI 38.45 kg/m  , BMI Body mass index is 38.45 kg/m.     Vitals:   03/01/21 1016 03/01/21 1142  BP: (!) 154/76 (!) 160/88  Pulse: 60   SpO2: 94%     GEN: Well nourished, well developed, in no acute distress. HEENT: normal. Neck: Supple, no JVD, carotid bruits, or masses. Cardiac: Irregularly irregular with to and fro bruit from left upper extremity AV fistula heard across his upper chest and towards his left shoulder, no rubs, or gallops. No clubbing, cyanosis, trace bilateral  lower extremity edema with skin changes associated chronic venous stasis.  Radials 2+ PT 1+ and equal bilaterally.  Respiratory:  Respirations regular and unlabored, clear to auscultation bilaterally. GI: Soft, nontender, nondistended, BS + x 4. MS: no deformity or atrophy. Skin: warm and dry, no rash. Neuro:  Strength and sensation are intact. Psych: Normal affect.  Accessory Clinical Findings    ECG personally reviewed by me today -atrial fibrillation, 60, IVCD- no acute changes.  Lab Results  Component Value Date   WBC 4.1 07/24/2020   HGB 11.1 (L) 07/24/2020   HCT 36.3 (L) 07/24/2020   MCV 78.7 (L) 07/24/2020   PLT 124 (L) 07/24/2020   Lab Results  Component Value Date   CREATININE 1.79 (H) 03/01/2021   BUN 53 (H) 03/01/2021   NA 140 03/01/2021   K 3.7 03/01/2021   CL 97 (L) 03/01/2021   CO2 33 (H) 03/01/2021   Lab Results  Component Value Date   ALT 14 07/22/2020   AST 21 07/22/2020   ALKPHOS 86 07/22/2020   BILITOT 0.9 07/22/2020   Lab Results  Component Value Date   CHOL 89 10/09/2017   HDL 34 (L) 03/06/2016   LDLCALC 49 10/09/2017   LDLDIRECT 41.9 04/20/2012   TRIG 73 03/06/2016   CHOLHDL 2.7 03/06/2016    Lab Results  Component Value Date   HGBA1C 8.3 (H) 07/22/2020    Assessment & Plan    1.  Chronic heart failure with midrange ejection fraction/ischemic cardiomyopathy: EF 45 to 50% by echo in February 2021 with  pulmonary hypertension noted at that time and also noted on subsequent right heart catheterization.  He has done well since his heart failure clinic visit in August and has been followed closely at home by para medicine.  His weight has largely been stable and he has chronic, stable trace lower extremity swelling in the setting of chronic venous stasis.  He does not typically experience dyspnea and denies orthopnea.  He remains on telemetry tadalafil therapy for pulmonary hypertension.  His blood pressures been elevated at home and is elevated  today.  I followed a basic metabolic panel today and BUN and creatinine are stable at 53 and 1.79.  Adding low-dose losartan 12.5 mg daily with plan for follow-up basic metabolic panel next week.  He is not on a beta-blocker in the setting of relative bradycardia.  He remains on torsemide 100 mg twice daily and metolazone 2.5 mg on Tuesdays and Fridays.  2.  Coronary artery disease/chest pain: Over the past several months, patient has been awakening with substernal chest discomfort without associated symptoms, approximately once every 2 weeks, lasting about 5 minutes, resolving spontaneously.  This has been concerning to him.  He does not experience exertional symptoms.  It will be 3 years in January since his last stress test.  We agreed to pursue Myoview to hopefully clear the air and provide reassurance.  He remains on rosuvastatin therapy.  No aspirin in the setting of chronic Eliquis.  3.  Pulmonary arterial hypertension: See 1.  Continue tadalafil.  4. Permanent atrial fibrillation: Well rate controlled in the absence of AV nodal blocking agents.  He is anticoagulated with Eliquis with stable H&H earlier this year.  5.  Stage III chronic kidney disease: Status post renal transplant 2012.  Creatinine stable today and adding back low-dose losartan in setting of ongoing hypertension here and at home.  6.  Essential hypertension: Blood pressure has been elevated at home, frequently in the 150s to 160s.  He is 160/88 on repeat check today.  He was previously on spironolactone and lisinopril but these were discontinued secondary to orthostasis.  He noted orthostatic symptoms 1 day last week but he felt that that was because he was dehydrated on a metolazone day.  We discussed options for management and I will add back low-dose losartan 12.5 mg daily with plan for follow-up basic metabolic panel next week.  He will contact us if he has any return of orthostasis.  7.  Hyperlipidemia: He is overdue for  lipids with his last lipid panel from September 2020 in care everywhere-LDL 27 at that time.  He is coming back for a basic metabolic panel next week and I will add lipids to that.  He remains on rosuvastatin therapy.  LFTs were normal earlier this year-April.  8.  Type 2 diabetes mellitus: A1c was 8.3 in April.  He is on Humalog, Semglee, and [place].  9.  Gout: No recent flares.  On colchicine.  10.  Disposition: Follow-up basic metabolic panel in 1 week.  Follow-up in clinic in 3 months or sooner if necessary.  Murray Hodgkins, NP 03/01/2021, 11:48 AM

## 2021-03-06 ENCOUNTER — Other Ambulatory Visit: Payer: Self-pay

## 2021-03-06 NOTE — Progress Notes (Signed)
03/07/21- 79 yoM for sleep evaluation with hx OSA/ BIPAP. Medical problem list includes CAD/ CABG4, CHF, PAH, AFib/ Eliquis, Chronic Venous Insufficiency, Superficial Thrombophlebitis, OSA, OHS, Asthma moderate, Covid infection, DM2, Gout,  ESRD/ transplant, Depression, Obesity/ Bariatric Surgery, Rheumatoid Arthritis,  NPSG [place] 03/15/12- AHI 57.9/ hr, desaturation to 78.8%, body weight 310 lbs BIPAP 14/9, PS 4/ [facility]- ordered 10/17/20 by Volanda Napoleon, NP Epworth score- Body weight today- Download- AirView stopped in April- 5G issue-  Body weight today-268 lbs Covid vax- 1 Phizer, 3 Moderna Flu vax- had -----Wearing Bipap-doing good, works well Overnight oximetry on BIPAP / Room Air 11/29/20- </= 88% for 2 hrs 58 minutes Disabled. Plans to go ice fishing [place]. Occ cigar.Denies lung disease. Has had home O2 in past- [facility].  Occasional wheeze which usually responds to diuresis.  He says he cannot sleep without his BiPAP machine.  Prior to Admission medications   Medication Sig Start Date End Date Taking? Authorizing Provider  albuterol (PROVENTIL) (2.5 MG/3ML) 0.083% nebulizer solution Take 3 mLs (2.5 mg total) by nebulization every 6 (six) hours as needed for wheezing or shortness of breath. 01/02/21  Yes Michela Pitcher, NP  albuterol (VENTOLIN HFA) 108 (90 Base) MCG/ACT inhaler Inhale 2 puffs into the lungs every 6 (six) hours as needed for wheezing or shortness of breath. 10/17/20  Yes Martyn Ehrich, NP  apixaban (ELIQUIS) 5 MG TABS tablet TAKE 1 TABLET BY MOUTH TWICE DAILY 01/25/21 01/25/22 Yes [name], MD  beclomethasone (QVAR) 80 MCG/ACT inhaler Inhale 2 puffs into the lungs 2 (two) times daily. 10/17/20  Yes Martyn Ehrich, NP  buPROPion [facility] SR) 150 MG 12 hr tablet TAKE 1 TABLET BY MOUTH TWICE DAILY 05/24/20 05/27/21 Yes [name], MD  cholecalciferol (VITAMIN D3) 25 MCG (1000 UNIT) tablet Take 1,000 Units by mouth daily.   Yes [provider]   colchicine 0.6 MG tablet Take 1 tablet (0.6 mg total) by mouth every other day 12/14/20  Yes   Cyanocobalamin (B-12 PO) Take by mouth.   Yes [provider]  dapagliflozin propanediol (FARXIGA) 10 MG TABS tablet TAKE 1 TABLET (10 MG TOTAL) BY MOUTH DAILY BEFORE BREAKFAST. 11/20/20  Yes [name], MD  Febuxostat 80 MG TABS Take 1 tablet (80 mg total) by mouth once daily for 180 days 02/23/21  Yes   fluticasone (FLONASE) 50 MCG/ACT nasal spray PLACE 2 SPRAYS INTO BOTH NOSTRILS DAILY. 06/30/20 06/30/21 Yes [name], MD  gabapentin (NEURONTIN) 300 MG capsule TAKE 1 CAPSULE BY MOUTH TWICE DAILY 10/20/20  Yes   HUMALOG KWIKPEN 100 UNIT/ML KwikPen Inject 5-10 Units into the skin 3 (three) times daily. Sliding scale 09/01/18  Yes [provider]  insulin glargine-yfgn (SEMGLEE) 100 UNIT/ML Pen Inject 40 Units subcutaneously once daily 10/06/20  Yes   iron polysaccharides (FERREX 150) 150 MG capsule TAKE 1 CAPSULE BY MOUTH 2 TIMES DAILY. 12/01/20  Yes   losartan (COZAAR) 25 MG tablet Take 0.5 tablets (12.5 mg total) by mouth daily. 03/01/21 05/31/21 Yes Theora Gianotti, NP  metolazone (ZAROXOLYN) 2.5 MG tablet Take 1 tablet by mouth 2 days per week 02/12/21  Yes Furth, Cadence H, PA-C  montelukast (SINGULAIR) 10 MG tablet TAKE 1 TABLET BY MOUTH AT BEDTIME. 10/17/20 10/17/21 Yes Martyn Ehrich, NP  Multiple Vitamin (MULTIVITAMIN WITH MINERALS) TABS tablet Take 1 tablet by mouth daily.   Yes [provider]  mycophenolate (MYFORTIC) 180 MG EC tablet Take 360 mg by mouth  2 (two) times daily.   Yes [provider]  omeprazole (PRILOSEC) 20 MG capsule TAKE 1 CAPSULE BY MOUTH DAILY. 12/28/20 12/28/21 Yes [name], MD  Potassium Chloride CR (MICRO-K) 8 MEQ CPCR capsule CR Take 9 capsules (72 mEq total) by mouth 2 (two) times daily. 09/20/20  Yes [name], MD  predniSONE (DELTASONE) 5 MG tablet Take 1 tablet (5 mg total) by mouth daily. 08/14/20  Yes    rosuvastatin (CRESTOR) 10 MG tablet TAKE 1 TABLET BY MOUTH DAILY 06/23/20 06/23/21 Yes Furth, Cadence H, PA-C  spironolactone (ALDACTONE) 25 MG tablet Take 1/2 tablet daily   Yes [provider]  sulfamethoxazole-trimethoprim (BACTRIM) 400-80 MG tablet TAKE 1 TABLET BY MOUTH EVERY MONDAY, [employer], FRIDAY. 08/23/20  Yes   tacrolimus (PROGRAF) 1 MG capsule Take 1 capsule (1 mg total) by mouth 2 times daily. 11/08/20  Yes   tacrolimus (PROGRAF) 1 MG capsule Take 2 capsules (2 mg total) by mouth 2 times daily. 01/22/21  Yes   tadalafil, PAH, (ADCIRCA) 20 MG tablet TAKE 1 TABLET (20 MG TOTAL) BY MOUTH EVERY OTHER DAY. 12/01/20 12/01/21 Yes [name], MD  tamsulosin (FLOMAX) 0.4 MG CAPS capsule TAKE 1 CAPSULE BY MOUTH DAILY. 05/23/20 05/27/21 Yes Belinda Block, MD  torsemide (DEMADEX) 100 MG tablet Take 1 tablet (100 mg total) by mouth 2 (two) times daily. 10/30/20 10/30/21 Yes [place], Maricela Bo, FNP  vitamin C (ASCORBIC ACID) 250 MG tablet Take 1 tablet (250 mg total) by mouth daily. 07/04/19  Yes Max Sane, MD  zinc sulfate 220 (50 Zn) MG capsule Take 220 mg by mouth daily.   Yes [provider]   Past Medical History:  Diagnosis Date   Allergy    seasonal   Amnestic MCI (mild cognitive impairment with memory loss)    Asthma    Benign neoplasm of colon    CAD (coronary artery disease) 2005   a.  Status post four-vessel CABG in 2005; b. 03/2018 MV: Small, fixed inferoapical and apical septal defect. No ischemia. EF 47% (60-65% by 05/2018 Echo).   Chronic atrial fibrillation ([place])    a. CHADS2VASc 4 (CHF, HTN, DM, vascular disease)--> Eliquis; b. 09/2018 [place]: Chronic Afib.   Chronic combined systolic and diastolic CHF (congestive heart failure) ([place])    a.  7/18 Echo: EF 45-50%; b. 05/2018 Echo: EF 60-65%, RVSP 74.8; c. 12/2018 Echo: EF 50-55%. Sev dil LA; d. 04/2019 Echo: EF 45-50%, glob HK. Mod reduced RV fxn. RVSP 53.19mHg.   Chronic venous stasis dermatitis of both lower  extremities    Cytomegaloviral disease ([facility] [place] 2017   Depression    Diabetes mellitus    Diverticulosis    Dyspnea    Erectile dysfunction    ESRD (end stage renal disease) ([place] 2005   a. HD 2004-2012; b. S/p renal transplant in 2012   GERD (gastroesophageal reflux disease)    Gout    Hyperlipidemia    low since renal failure   Hypertension    Kidney transplant status, cadaveric 2012   Obesity    PAH (pulmonary artery hypertension) ([place]    a. 05/2018 Echo: RVSP 74.832mg; b. 12/2018 RV not well visualized; c. 04/2019 RHC: RA 19, RV 80/20, PA 78/31 (51), PCWP 25, CO/CI 7.69/3.26. PVR 3.25 WU-->Sev PAH, likely primarily PV HTN.   Pneumonia    Purpura ([facility]   Sleep apnea    bipap with oxygen 2 l   Trigger finger    Ulcer 05/2016  Left shin   Wears glasses    Past Surgical History:  Procedure Laterality Date   BARIATRIC SURGERY     CARDIAC CATHETERIZATION     [facility]   CARDIAC CATHETERIZATION  05/03/2019   CATARACT EXTRACTION W/PHACO Right 10/29/2017   Procedure: CATARACT EXTRACTION PHACO AND INTRAOCULAR LENS PLACEMENT ([place])  RIGHT DIABETIC;  Surgeon: [name], MD;  Location: [place];  Service: Ophthalmology;  Laterality: Right;  Diabetic - insulin   CATARACT EXTRACTION W/PHACO Left 11/18/2017   Procedure: CATARACT EXTRACTION PHACO AND INTRAOCULAR LENS PLACEMENT ([place]) LEFT IVA/TOPICAL;  Surgeon: [name], MD;  Location: [place];  Service: Ophthalmology;  Laterality: Left;  DIABETES - insulin sleep apnea   COLONOSCOPY WITH PROPOFOL N/A 04/22/2013   Procedure: COLONOSCOPY WITH PROPOFOL;  Surgeon: [name], MD;  Location: [facility];  Service: Endoscopy;  Laterality: N/A;   CORONARY ARTERY BYPASS GRAFT  2005   X 4   DG ANGIO AV SHUNT*L*     right and left upper arms   FASCIOTOMY  03/03/2012   Procedure: FASCIOTOMY;  Surgeon: [name], MD;  Location: [place];  Service: Orthopedics;  Laterality: Right;   FASCIOTOMY RIGHT SMALL FINGER   FASCIOTOMY Left 08/17/2013   Procedure: FASCIOTOMY LEFT RING;  Surgeon: [name], MD;  Location: [place];  Service: Orthopedics;  Laterality: Left;   INCISION AND DRAINAGE ABSCESS Left 10/15/2015   Procedure: INCISION AND DRAINAGE ABSCESS;  Surgeon: [name], MD;  Location: [facility];  Service: General;  Laterality: Left;   KIDNEY TRANSPLANT  09/13/2010   cadaver--at [facility] HEART CATH N/A 11/15/2016   Procedure: RIGHT HEART CATH;  Surgeon: [name], MD;  Location: [place] [facility];  Service: Cardiovascular;  Laterality: N/A;   RIGHT HEART CATH N/A 05/03/2019   Procedure: RIGHT HEART CATH;  Surgeon: [name], MD;  Location: [place] [facility];  Service: Cardiovascular;  Laterality: N/A;   TYMPANIC MEMBRANE REPAIR  1/12   left   VASECTOMY     Family History  Problem Relation Age of Onset   Heart disease Father    Kidney failure Father    Kidney disease Father    Diabetes Maternal Grandmother    Breast cancer Maternal Grandmother    Valvular heart disease Mother    Liver cancer Paternal Uncle    Liver cancer Paternal Grandmother    Prostate cancer Neg Hx    Social History   Socioeconomic History   Marital status: Married    Spouse name: Not on file   Number of children: 2   Years of education: Not on file   Highest education level: Not on file  Occupational History   Occupation: Warden/ranger: unemployed    Comment: disabled due to kidney failure  Tobacco Use   Smoking status: Former    Types: Cigars    Quit date: 09/02/1994    Years since quitting: 26.5   Smokeless tobacco: Never  Vaping Use   Vaping Use: Never used  Substance and Sexual Activity   Alcohol use: Yes    Alcohol/week: 0.0 - 1.0 standard drinks    Comment: occasional- 3 in the past year   Drug use: No   Sexual activity: Not on file  Other Topics Concern   Not on file  Social History Narrative    In school for culinary arts      Has living will  Wife is health care POA   Would accept resuscitation attempts   Hasn't considered tube feedings   Social Determinants of Health   Financial Resource Strain: Not on file  Food Insecurity: Not on file  Transportation Needs: Not on file  Physical Activity: Not on file  Stress: Not on file  Social Connections: Not on file  Intimate Partner Violence: Not on file   ROS-see HPI   + = positive Constitutional:    weight loss, night sweats, fevers, chills, fatigue, lassitude. HEENT:    headaches, difficulty swallowing, tooth/dental problems, sore throat,       sneezing, itching, ear ache, nasal congestion, post nasal drip, snoring CV:    chest pain, orthopnea, PND, swelling in lower extremities, anasarca,                                  dizziness, palpitations Resp:   shortness of breath with exertion or at rest.                productive cough,   non-productive cough, coughing up of blood.              change in color of mucus.  wheezing.   Skin:    rash or lesions. GI:  No-   heartburn, indigestion, abdominal pain, nausea, vomiting, diarrhea,                 change in bowel habits, loss of appetite GU: dysuria, change in color of urine, no urgency or frequency.   flank pain. MS:   joint pain, stiffness, decreased range of motion, back pain. Neuro-     nothing unusual Psych:  change in mood or affect.  depression or anxiety.   memory loss.  OBJ- Physical Exam General- Alert, Oriented, Affect-appropriate, Distress- none acute, +obese Skin- rash-none, lesions- none, excoriation- none Lymphadenopathy- none Head- atraumatic            Eyes- Gross vision intact, PERRLA, conjunctivae and secretions clear            Ears- +Hearing aids            Nose- Clear, no-Septal dev, mucus, polyps, erosion, perforation             Throat- Mallampati III-IV , mucosa clear , drainage- none, tonsils- atrophic Neck- flexible , trachea midline, no  stridor , thyroid nl, carotid no bruit Chest - symmetrical excursion , unlabored           Heart/CV- RRR , no murmur , no gallop  , no rub, nl s1 s2                           - JVD- none , edema- none, stasis changes- none, varices- none           Lung- clear to P&A, wheeze- none, cough- none , dullness-none, rub- none           Chest wall-  Abd- + kidney R abd Br/ Gen/ Rectal- Not done, not indicated Extrem- cyanosis- none, clubbing, none, atrophy- none, strength- nl Neuro- grossly intact to observation

## 2021-03-07 ENCOUNTER — Encounter: Payer: Self-pay | Admitting: Internal Medicine

## 2021-03-07 ENCOUNTER — Ambulatory Visit (INDEPENDENT_AMBULATORY_CARE_PROVIDER_SITE_OTHER): Payer: 59 | Admitting: Internal Medicine

## 2021-03-07 ENCOUNTER — Other Ambulatory Visit: Payer: Self-pay

## 2021-03-07 VITALS — BP 140/70 | HR 66 | Temp 98.0°F | Ht 70.5 in | Wt 268.8 lb

## 2021-03-07 DIAGNOSIS — G4734 Idiopathic sleep related nonobstructive alveolar hypoventilation: Secondary | ICD-10-CM

## 2021-03-07 DIAGNOSIS — Z760 Encounter for issue of repeat prescription: Secondary | ICD-10-CM | POA: Diagnosis not present

## 2021-03-07 DIAGNOSIS — G4733 Obstructive sleep apnea (adult) (pediatric): Secondary | ICD-10-CM

## 2021-03-07 DIAGNOSIS — I255 Ischemic cardiomyopathy: Secondary | ICD-10-CM

## 2021-03-07 NOTE — Assessment & Plan Note (Signed)
He feels he sleeps well with BIPAP and can't sleep without it.  Plan- requesting download to assess compliance and control- curently on 14/9, PS4

## 2021-03-07 NOTE — Patient Instructions (Signed)
Order- DME [employer]- please add AirView/ card and provide update compliance and control download.   Once we are sure BIPAP is set correctly, we will repeat an overnight oximetry to see if oxygen levels are still dropping.   Please call if we can help

## 2021-03-07 NOTE — Assessment & Plan Note (Signed)
If BIPAP settings are way off, adjustment might be enough to correct hypoxemia. We will recheck [place] on BIPAP once download is available.

## 2021-03-08 ENCOUNTER — Other Ambulatory Visit: Payer: Self-pay

## 2021-03-09 ENCOUNTER — Other Ambulatory Visit: Payer: Self-pay

## 2021-03-09 ENCOUNTER — Ambulatory Visit
Admission: RE | Admit: 2021-03-09 | Discharge: 2021-03-09 | Disposition: A | Discharge status: Home / Self Care | Payer: 59 | Source: Ambulatory Visit | Attending: Nurse Practitioner | Admitting: Nurse Practitioner

## 2021-03-09 DIAGNOSIS — R079 Chest pain, unspecified: Secondary | ICD-10-CM | POA: Diagnosis not present

## 2021-03-09 LAB — NM MYOCAR MULTI W/SPECT W/WALL MOTION / EF
LV dias vol: 214 mL (ref 62–150)
LV sys vol: 119 mL
Nuc Stress EF: 44 %
Peak HR: 78 {beats}/min
Percent HR: 49 %
Rest HR: 60 {beats}/min
Rest Nuclear Isotope Dose: 10.2 mCi
SDS: 0
SRS: 2
SSS: 2
ST Depression (mm): 0 mm
Stress Nuclear Isotope Dose: 32.1 mCi
TID: 0.98

## 2021-03-09 MED ORDER — TECHNETIUM TC 99M TETROFOSMIN IV KIT
30.0000 | PACK | Freq: Once | INTRAVENOUS | Status: AC | PRN
  Administered 2021-03-09: 32.1 via INTRAVENOUS

## 2021-03-09 MED ORDER — REGADENOSON 0.4 MG/5ML IV SOLN
0.4000 mg | Freq: Once | INTRAVENOUS | Status: AC
Start: 2021-03-09 — End: 2021-03-09
  Administered 2021-03-09: 0.4 mg via INTRAVENOUS
  Filled 2021-03-09: qty 5

## 2021-03-09 MED ORDER — TECHNETIUM TC 99M TETROFOSMIN IV KIT
10.0000 | PACK | Freq: Once | INTRAVENOUS | Status: AC | PRN
  Administered 2021-03-09: 10.19 via INTRAVENOUS

## 2021-03-09 NOTE — Addendum Note (Signed)
Addended by: Ronaldo Miyamoto on: 03/09/2021 03:37 PM   Modules accepted: Orders

## 2021-03-11 DIAGNOSIS — G4733 Obstructive sleep apnea (adult) (pediatric): Secondary | ICD-10-CM | POA: Diagnosis not present

## 2021-03-13 ENCOUNTER — Other Ambulatory Visit (HOSPITAL_COMMUNITY): Payer: Self-pay

## 2021-03-13 ENCOUNTER — Other Ambulatory Visit: Payer: Self-pay

## 2021-03-13 DIAGNOSIS — G4733 Obstructive sleep apnea (adult) (pediatric): Secondary | ICD-10-CM | POA: Diagnosis not present

## 2021-03-13 DIAGNOSIS — E662 Morbid (severe) obesity with alveolar hypoventilation: Secondary | ICD-10-CM | POA: Diagnosis not present

## 2021-03-20 ENCOUNTER — Other Ambulatory Visit: Payer: Self-pay

## 2021-03-20 MED FILL — Dapagliflozin Propanediol Tab 10 MG (Base Equivalent): ORAL | 30 days supply | Qty: 30 | Fill #4 | Status: AC

## 2021-03-21 NOTE — Progress Notes (Signed)
Met with [name] in a parking lot of [employer].  He appears to be doing well, he states feeling good. Looking forward to Christmas with his family and watching his grandson.  He has several doctor appts today and aware of all of them.  He stays active around the house.  He has been keeping his grandson some during the day.  He has no complaints such as chest pain, headaches or dizziness.  He has all his medications and aware of how to take them.  Will continue to visit for heart failure, diet and medication compliance.   [place] [phone]

## 2021-03-26 ENCOUNTER — Encounter: Payer: Self-pay | Admitting: Internal Medicine

## 2021-03-27 ENCOUNTER — Other Ambulatory Visit: Payer: Self-pay

## 2021-03-27 DIAGNOSIS — E662 Morbid (severe) obesity with alveolar hypoventilation: Secondary | ICD-10-CM | POA: Diagnosis not present

## 2021-03-27 DIAGNOSIS — G4733 Obstructive sleep apnea (adult) (pediatric): Secondary | ICD-10-CM | POA: Diagnosis not present

## 2021-03-28 ENCOUNTER — Other Ambulatory Visit: Payer: Self-pay

## 2021-03-30 DIAGNOSIS — E1165 Type 2 diabetes mellitus with hyperglycemia: Secondary | ICD-10-CM | POA: Diagnosis not present

## 2021-04-02 ENCOUNTER — Other Ambulatory Visit: Payer: Self-pay

## 2021-04-02 MED FILL — Omeprazole Cap Delayed Release 20 MG: ORAL | 90 days supply | Qty: 90 | Fill #1 | Status: AC

## 2021-04-02 MED FILL — Rosuvastatin Calcium Tab 10 MG: ORAL | 90 days supply | Qty: 90 | Fill #2 | Status: AC

## 2021-04-03 ENCOUNTER — Encounter (HOSPITAL_COMMUNITY): Payer: Self-pay

## 2021-04-03 ENCOUNTER — Other Ambulatory Visit (HOSPITAL_COMMUNITY): Payer: Self-pay

## 2021-04-03 ENCOUNTER — Telehealth: Payer: Self-pay | Admitting: Internal Medicine

## 2021-04-03 NOTE — Progress Notes (Signed)
Today had a home visit with [name].  He states doing well except for some blisters on his legs.  Suggested him to see a vascular, he states will call them today.  He keeps getting sores and his legs are very dark in color.  He said he may call the wound care doctor also.  Advised him would be good.  Edema is down, lungs are clear.  Weight is staying same.  He states urinating good.  Metolazone is today whish he has taken.  Holidays went well, he does have some depression which he tries to stay busy and not think abut his health conditions.  He is enjoying keeping his grandson some.  He is not going to the gym but he says he needs to start back.  He has been traveling some and does well with that.  Took him a new med box, he does his own meds and aware of how to take them.  He denies any chest pain, headaches, dizziness or increased shortness of breath.  He is aware of up coming visits.  Will continue to visit for heart failure, diet and medication compliance.   [place] [phone]

## 2021-04-03 NOTE — Telephone Encounter (Signed)
Spoke to pt. He said he was started to feel bad. May end up in ER. If not, he is going to see Romilda Garret, NP at [facility] in the morning at 940.

## 2021-04-03 NOTE — Telephone Encounter (Signed)
Also needs a referral to a vein specialist.

## 2021-04-03 NOTE — Telephone Encounter (Signed)
Mr. Sagar called in and wanted to know about getting a referral to a wound specialist in [place].

## 2021-04-04 ENCOUNTER — Encounter: Payer: Self-pay | Admitting: Emergency Medicine

## 2021-04-04 ENCOUNTER — Emergency Department
Admission: EM | Admit: 2021-04-04 | Discharge: 2021-04-04 | Disposition: A | Discharge status: Home / Self Care | Payer: 59 | Attending: Student in an Organized Health Care Education/Training Program | Admitting: Student in an Organized Health Care Education/Training Program

## 2021-04-04 ENCOUNTER — Ambulatory Visit: Payer: 59 | Admitting: Nurse Practitioner

## 2021-04-04 ENCOUNTER — Other Ambulatory Visit: Payer: Self-pay

## 2021-04-04 DIAGNOSIS — L03115 Cellulitis of right lower limb: Secondary | ICD-10-CM | POA: Diagnosis not present

## 2021-04-04 DIAGNOSIS — L03116 Cellulitis of left lower limb: Secondary | ICD-10-CM | POA: Diagnosis not present

## 2021-04-04 DIAGNOSIS — L03119 Cellulitis of unspecified part of limb: Secondary | ICD-10-CM

## 2021-04-04 DIAGNOSIS — M79605 Pain in left leg: Secondary | ICD-10-CM | POA: Diagnosis present

## 2021-04-04 LAB — COMPREHENSIVE METABOLIC PANEL
ALT: 12 U/L (ref 0–44)
AST: 19 U/L (ref 15–41)
Albumin: 3.4 g/dL — ABNORMAL LOW (ref 3.5–5.0)
Alkaline Phosphatase: 111 U/L (ref 38–126)
Anion gap: 9 (ref 5–15)
BUN: 57 mg/dL — ABNORMAL HIGH (ref 8–23)
CO2: 31 mmol/L (ref 22–32)
Calcium: 8.9 mg/dL (ref 8.9–10.3)
Chloride: 95 mmol/L — ABNORMAL LOW (ref 98–111)
Creatinine, Ser: 1.74 mg/dL — ABNORMAL HIGH (ref 0.61–1.24)
GFR, Estimated: 44 mL/min — ABNORMAL LOW (ref >60)
Glucose, Bld: 225 mg/dL — ABNORMAL HIGH (ref 70–99)
Potassium: 3.2 mmol/L — ABNORMAL LOW (ref 3.5–5.1)
Sodium: 135 mmol/L (ref 135–145)
Total Bilirubin: 1 mg/dL (ref 0.3–1.2)
Total Protein: 6.9 g/dL (ref 6.5–8.1)

## 2021-04-04 LAB — CBC WITH DIFFERENTIAL/PLATELET
Abs Immature Granulocytes: 0.02 10*3/uL (ref 0.00–0.07)
Basophils Absolute: 0 10*3/uL (ref 0.0–0.1)
Basophils Relative: 1 %
Eosinophils Absolute: 0 10*3/uL (ref 0.0–0.5)
Eosinophils Relative: 1 %
HCT: 39.1 % (ref 39.0–52.0)
Hemoglobin: 12.1 g/dL — ABNORMAL LOW (ref 13.0–17.0)
Immature Granulocytes: 0 %
Lymphocytes Relative: 12 %
Lymphs Abs: 0.8 10*3/uL (ref 0.7–4.0)
MCH: 25.5 pg — ABNORMAL LOW (ref 26.0–34.0)
MCHC: 30.9 g/dL (ref 30.0–36.0)
MCV: 82.5 fL (ref 80.0–100.0)
Monocytes Absolute: 0.4 10*3/uL (ref 0.1–1.0)
Monocytes Relative: 6 %
Neutro Abs: 5.3 10*3/uL (ref 1.7–7.7)
Neutrophils Relative %: 80 %
Platelets: 138 10*3/uL — ABNORMAL LOW (ref 150–400)
RBC: 4.74 MIL/uL (ref 4.22–5.81)
RDW: 15.9 % — ABNORMAL HIGH (ref 11.5–15.5)
WBC: 6.5 10*3/uL (ref 4.0–10.5)
nRBC: 0 % (ref 0.0–0.2)

## 2021-04-04 LAB — LACTIC ACID, PLASMA: Lactic Acid, Venous: 1 mmol/L (ref 0.5–1.9)

## 2021-04-04 MED ORDER — CEPHALEXIN 500 MG PO CAPS
500.0000 mg | ORAL_CAPSULE | Freq: Three times a day (TID) | ORAL | 0 refills | Status: AC
  Filled 2021-04-04: qty 21, 7d supply, fill #0

## 2021-04-04 MED ORDER — CEPHALEXIN 500 MG PO CAPS
500.0000 mg | ORAL_CAPSULE | Freq: Once | ORAL | Status: AC
Start: 2021-04-04 — End: 2021-04-04
  Administered 2021-04-04: 500 mg via ORAL
  Filled 2021-04-04: qty 1

## 2021-04-04 NOTE — ED Provider Notes (Signed)
[facility] Provider Note    Event Date/Time   First MD Initiated Contact with Patient 04/04/21 [phone]     (approximate)   History   Wound Check   HPI  [name]. is a 61 y.o. male extensive past medical history as well as history of cellulitis.  As an outpatient presents to the ER for evaluation of her extremity blisters as well developed warmth and discomfort to bilateral lower extremity and shins.  Has had cellulitis in this area before.  Denies any purulence.  Last night did start feeling some chills.  Not currently on any antibiotics.  Denies any chest pain or shortness of breath.  No h/o MRSA infection.       Physical Exam   Triage Vital Signs: ED Triage Vitals  Enc Vitals Group     BP 04/04/21 0952 (!) 151/80     Pulse Rate 04/04/21 0952 65     Resp 04/04/21 0952 17     Temp 04/04/21 0952 98.5 F (36.9 C)     Temp Source 04/04/21 0952 Oral     SpO2 04/04/21 0952 91 %     Weight 04/04/21 0929 259 lb 0.7 oz (117.5 kg)     Height 04/04/21 0929 5' 10.5" (1.791 m)     Head Circumference --      Peak Flow --      Pain Score 04/04/21 0928 7     Pain Loc --      Pain Edu? --      Excl. in [place]? --     Most recent vital signs: Vitals:   04/04/21 0952  BP: (!) 151/80  Pulse: 65  Resp: 17  Temp: 98.5 F (36.9 C)  SpO2: 91%     Constitutional: Alert  Eyes: Conjunctivae are normal.  Head: Atraumatic. Nose: No congestion/rhinnorhea. Mouth/Throat: Mucous membranes are moist.   Neck: Painless ROM.  Cardiovascular:   Good peripheral circulation. Respiratory: Normal respiratory effort.  No retractions.  Gastrointestinal: Soft and nontender.  Musculoskeletal:  .  1+ bilateral lower extremity pitting edema with chronic venous stasis changes but evidence of recent blisters and ulceration with overlying warmth and some erythema.  Compartment is soft.  No crepitus.  No purulence. Neurologic:  MAE spontaneously. No gross focal  neurologic deficits are appreciated.  Skin:  Skin is warm, dry and intact. No rash noted. Psychiatric: Mood and affect are normal. Speech and behavior are normal.    ED Results / Procedures / Treatments   Labs (all labs ordered are listed, but only abnormal results are displayed) Labs Reviewed  COMPREHENSIVE METABOLIC PANEL - Abnormal; Notable for the following components:      Result Value   Potassium 3.2 (*)    Chloride 95 (*)    Glucose, Bld 225 (*)    BUN 57 (*)    Creatinine, Ser 1.74 (*)    Albumin 3.4 (*)    GFR, Estimated 44 (*)    All other components within normal limits  CBC WITH DIFFERENTIAL/PLATELET - Abnormal; Notable for the following components:   Hemoglobin 12.1 (*)    MCH 25.5 (*)    RDW 15.9 (*)    Platelets 138 (*)    All other components within normal limits  LACTIC ACID, PLASMA  LACTIC ACID, PLASMA  URINALYSIS, ROUTINE W REFLEX MICROSCOPIC     EKG     RADIOLOGY    PROCEDURES:  Critical Care performed: No  Procedures   MEDICATIONS  ORDERED IN ED: Medications  cephALEXin (KEFLEX) capsule 500 mg (has no administration in time range)     IMPRESSION / MDM / ASSESSMENT AND PLAN / ED COURSE  I reviewed the triage vital signs and the nursing notes.                              Differential diagnosis includes, but is not limited to, cellulitis, abscess, venous stasis, ulcer, doubt DVT, doubt NSTI  Patient with extensive past medical history but well-appearing today presents to the ER for several days of blistering of his lower extremities with some redness and pain bilaterally but left greater than right.  He is on anticoagulation therefore not consistent with DVT.  His exam is reassuring and does not seem consistent with an STI.  No leukocytosis no fever here no tachycardia.  Does have chronic venous stasis with ulcerations and given the warmth will be placed on antibiotics for component of cellulitis will be given referral to wound care.  I  have considered admission the hospital for IV antibiotics based on his comorbidities but given his well appearance at this time and as he has not been on any antibiotics I think a trial of outpatient oral antibiotics is reasonable.  Patient instructed return to the ER in 24 to 48 hours for wound check     FINAL CLINICAL IMPRESSION(S) / ED DIAGNOSES   Final diagnoses:  Cellulitis of lower extremity, unspecified laterality     Rx / DC Orders   ED Discharge Orders          Ordered    cephALEXin (KEFLEX) 500 MG capsule  3 times daily        04/04/21 1108    AMB referral to wound care center        04/04/21 1108             Note:  This document was prepared using Dragon voice recognition software and may include unintentional dictation errors.    Merlyn Lot, MD 04/04/21 1112

## 2021-04-04 NOTE — ED Notes (Signed)
BLE wrapped w/ xeroform and kurlix.

## 2021-04-04 NOTE — ED Notes (Signed)
Pt reports he has tried to get into a wound center, but appointments are 3 weeks out.

## 2021-04-04 NOTE — Discharge Instructions (Signed)
Keep leg elevated.  Antibiotics have sent to your pharmacy.  You should start your medications this evening.  Return to the ER or wound care center in 24 to 48 hours for recheck or sooner if symptoms worsen.

## 2021-04-04 NOTE — ED Notes (Signed)
EDP at bedside  

## 2021-04-04 NOTE — ED Triage Notes (Signed)
Pt comes into the ED via POV c/o bilateral leg wounds that are draining.  Pt states he is also having some increased redness and chills.  Pt ambulatory to triage at this time and in NAD.

## 2021-04-11 DIAGNOSIS — G4733 Obstructive sleep apnea (adult) (pediatric): Secondary | ICD-10-CM | POA: Diagnosis not present

## 2021-04-16 ENCOUNTER — Other Ambulatory Visit: Payer: Self-pay

## 2021-04-16 ENCOUNTER — Other Ambulatory Visit: Payer: Self-pay | Admitting: Cardiovascular Disease

## 2021-04-16 ENCOUNTER — Encounter: Payer: 59 | Attending: Physician Assistant | Admitting: Physician Assistant

## 2021-04-16 DIAGNOSIS — N186 End stage renal disease: Secondary | ICD-10-CM | POA: Diagnosis not present

## 2021-04-16 DIAGNOSIS — E1151 Type 2 diabetes mellitus with diabetic peripheral angiopathy without gangrene: Secondary | ICD-10-CM | POA: Insufficient documentation

## 2021-04-16 DIAGNOSIS — I132 Hypertensive heart and chronic kidney disease with heart failure and with stage 5 chronic kidney disease, or end stage renal disease: Secondary | ICD-10-CM | POA: Insufficient documentation

## 2021-04-16 DIAGNOSIS — I87311 Chronic venous hypertension (idiopathic) with ulcer of right lower extremity: Secondary | ICD-10-CM | POA: Diagnosis not present

## 2021-04-16 DIAGNOSIS — E1122 Type 2 diabetes mellitus with diabetic chronic kidney disease: Secondary | ICD-10-CM | POA: Insufficient documentation

## 2021-04-16 DIAGNOSIS — I509 Heart failure, unspecified: Secondary | ICD-10-CM | POA: Diagnosis not present

## 2021-04-16 DIAGNOSIS — L97822 Non-pressure chronic ulcer of other part of left lower leg with fat layer exposed: Secondary | ICD-10-CM | POA: Diagnosis not present

## 2021-04-16 DIAGNOSIS — L97812 Non-pressure chronic ulcer of other part of right lower leg with fat layer exposed: Secondary | ICD-10-CM | POA: Diagnosis not present

## 2021-04-16 DIAGNOSIS — Z940 Kidney transplant status: Secondary | ICD-10-CM | POA: Insufficient documentation

## 2021-04-16 DIAGNOSIS — I87313 Chronic venous hypertension (idiopathic) with ulcer of bilateral lower extremity: Secondary | ICD-10-CM | POA: Diagnosis not present

## 2021-04-16 DIAGNOSIS — E11622 Type 2 diabetes mellitus with other skin ulcer: Secondary | ICD-10-CM | POA: Diagnosis not present

## 2021-04-16 DIAGNOSIS — I890 Lymphedema, not elsewhere classified: Secondary | ICD-10-CM | POA: Insufficient documentation

## 2021-04-16 DIAGNOSIS — Z992 Dependence on renal dialysis: Secondary | ICD-10-CM | POA: Insufficient documentation

## 2021-04-16 DIAGNOSIS — I872 Venous insufficiency (chronic) (peripheral): Secondary | ICD-10-CM | POA: Diagnosis not present

## 2021-04-16 DIAGNOSIS — E1140 Type 2 diabetes mellitus with diabetic neuropathy, unspecified: Secondary | ICD-10-CM | POA: Insufficient documentation

## 2021-04-16 MED FILL — Metolazone Tab 2.5 MG: ORAL | 35 days supply | Qty: 10 | Fill #0 | Status: AC

## 2021-04-16 NOTE — Progress Notes (Signed)
[name] ([id number]) Visit Report for 04/16/2021 Chief Complaint Document Details Patient Name: [name], [name]. Date of Service: 04/16/2021 10:00 AM Medical Record Number: [id number] Patient Account Number: [id number] Date of Birth/Sex: [birth year] (61 y.o. M) Treating RN: Donnamarie Poag Primary Care Provider: [name] Other Clinician: Referring Provider: Merlyn Lot Treating Provider/Extender: Skipper Cliche in Treatment: 0 Information Obtained from: Patient Chief Complaint Bilateral LE Ulcers Electronic Signature(s) Signed: 04/16/2021 11:26:14 AM By: Worthy Keeler PA-C Entered By: Worthy Keeler on 04/16/2021 11:26:14 [name] ([id number]) -------------------------------------------------------------------------------- Debridement Details Patient Name: [name]. Date of Service: 04/16/2021 10:00 AM Medical Record Number: [id number] Patient Account Number: [id number] Date of Birth/Sex: 17-Jun-1960 (61 y.o. M) Treating RN: Donnamarie Poag Primary Care Provider: [name] Other Clinician: Referring Provider: Merlyn Lot Treating Provider/Extender: Skipper Cliche in Treatment: 0 Debridement Performed for Wound #5 Right,Lateral,Anterior Lower Leg Assessment: Performed By: Physician Tommie Sams., PA-C Debridement Type: Debridement Severity of Tissue Pre Debridement: Fat layer exposed Level of Consciousness (Pre- Awake and Alert procedure): Pre-procedure Verification/Time Out Yes - 11:29 Taken: Start Time: 11:30 Pain Control: Lidocaine Total Area Debrided (L x W): 0.8 (cm) x 0.6 (cm) = 0.48 (cm) Tissue and other material Viable, Non-Viable, Slough, Subcutaneous, Slough debrided: Level: Skin/Subcutaneous Tissue Debridement Description: Excisional Instrument: Curette Bleeding: Minimum Hemostasis Achieved: Pressure Response to Treatment: Procedure was tolerated well Level of Consciousness (Post- Awake and  Alert procedure): Post Debridement Measurements of Total Wound Length: (cm) 0.3 Width: (cm) 0.4 Depth: (cm) 0.1 Volume: (cm) 0.009 Character of Wound/Ulcer Post Debridement: Improved Severity of Tissue Post Debridement: Fat layer exposed Post Procedure Diagnosis Same as Pre-procedure Electronic Signature(s) Signed: 04/16/2021 4:33:32 PM By: Donnamarie Poag Signed: 04/16/2021 4:34:41 PM By: Worthy Keeler PA-C Entered By: Donnamarie Poag on 04/16/2021 11:33:12 Hyun, Dorothea Glassman ([id number]) -------------------------------------------------------------------------------- Debridement Details Patient Name: [name]. Date of Service: 04/16/2021 10:00 AM Medical Record Number: [id number] Patient Account Number: [id number] Date of Birth/Sex: 1960-12-07 (61 y.o. M) Treating RN: Donnamarie Poag Primary Care Provider: [name] Other Clinician: Referring Provider: Merlyn Lot Treating Provider/Extender: Skipper Cliche in Treatment: 0 Debridement Performed for Wound #4 Right,Medial,Anterior Lower Leg Assessment: Performed By: Physician Tommie Sams., PA-C Debridement Type: Debridement Severity of Tissue Pre Debridement: Fat layer exposed Level of Consciousness (Pre- Awake and Alert procedure): Pre-procedure Verification/Time Out Yes - 11:29 Taken: Start Time: 11:32 Pain Control: Lidocaine Total Area Debrided (L x W): 1.9 (cm) x 1.5 (cm) = 2.85 (cm) Tissue and other material Viable, Non-Viable, Slough, Subcutaneous, Slough debrided: Level: Skin/Subcutaneous Tissue Debridement Description: Excisional Instrument: Curette Bleeding: Minimum Hemostasis Achieved: Pressure Response to Treatment: Procedure was tolerated well Level of Consciousness (Post- Awake and Alert procedure): Post Debridement Measurements of Total Wound Length: (cm) 2 Width: (cm) 1.6 Depth: (cm) 0.1 Volume: (cm) 0.251 Character of Wound/Ulcer Post Debridement: Improved Severity of Tissue Post  Debridement: Fat layer exposed Post Procedure Diagnosis Same as Pre-procedure Electronic Signature(s) Signed: 04/16/2021 4:33:32 PM By: Donnamarie Poag Signed: 04/16/2021 4:34:41 PM By: Worthy Keeler PA-C Entered By: Donnamarie Poag on 04/16/2021 11:34:18 Bonillas, Dorothea Glassman ([id number]) -------------------------------------------------------------------------------- Debridement Details Patient Name: [name]. Date of Service: 04/16/2021 10:00 AM Medical Record Number: [id number] Patient Account Number: [id number] Date of Birth/Sex: [birth year] (61 y.o. M) Treating RN: Donnamarie Poag Primary Care Provider: [name] Other Clinician: Referring Provider: Merlyn Lot Treating Provider/Extender: Skipper Cliche in Treatment: 0 Debridement Performed for Wound #3 Left,Anterior Lower Leg Assessment: Performed By: Physician Tommie Sams.,  PA-C Debridement Type: Debridement Severity of Tissue Pre Debridement: Fat layer exposed Level of Consciousness (Pre- Awake and Alert procedure): Pre-procedure Verification/Time Out Yes - 11:29 Taken: Start Time: 11:34 Pain Control: Lidocaine Total Area Debrided (L x W): 3.8 (cm) x 1.6 (cm) = 6.08 (cm) Tissue and other material Viable, Non-Viable, Slough, Subcutaneous, Slough debrided: Level: Skin/Subcutaneous Tissue Debridement Description: Excisional Instrument: Curette Bleeding: Minimum Hemostasis Achieved: Pressure Response to Treatment: Procedure was tolerated well Level of Consciousness (Post- Awake and Alert procedure): Post Debridement Measurements of Total Wound Length: (cm) 3.8 Width: (cm) 1.6 Depth: (cm) 0.1 Volume: (cm) 0.478 Character of Wound/Ulcer Post Debridement: Improved Severity of Tissue Post Debridement: Fat layer exposed Post Procedure Diagnosis Same as Pre-procedure Electronic Signature(s) Signed: 04/16/2021 4:33:32 PM By: Donnamarie Poag Signed: 04/16/2021 4:34:41 PM By: Worthy Keeler PA-C Entered By:  Donnamarie Poag on 04/16/2021 11:37:10 Cotrell, Dorothea Glassman ([id number]) -------------------------------------------------------------------------------- HPI Details Patient Name: [name]. Date of Service: 04/16/2021 10:00 AM Medical Record Number: [id number] Patient Account Number: [id number] Date of Birth/Sex: 1960/09/09 (60 y.o. M) Treating RN: Donnamarie Poag Primary Care Provider: [name] Other Clinician: Referring Provider: Merlyn Lot Treating Provider/Extender: Skipper Cliche in Treatment: 0 History of Present Illness Location: left anterior shin and left medial calf Quality: Patient reports experiencing a dull pain to affected area(s). Severity: Patient states wound are getting worse. Duration: Patient has had the wound for > 3 months prior to seeking treatment at the wound center Timing: Pain in wound is Intermittent (comes and goes Context: The wound appeared gradually over time Modifying Factors: Other treatment(s) tried include:had surgery on the left anterior shin for a hematoma which has been nonhealing for the last 8 months Associated Signs and Symptoms: Patient reports having increase swelling. HPI Description: 61 year old diabetic was last hemoglobin A1c was 7.2% has been reviewed today with 2 ulcerated areas in his left lower extremity which she's had for about 8 months and the one most recently was started as a blister for 2 weeks. he has a past medical history of atrial fibrillation, coronary artery disease, CHF, chronic venous stasis dermatitis, diabetes mellitus, hypertension, sleep apnea and is status post kidney transplant in 2012. He is a former smoker and quit cigars in 1996. In the remote past he's had some arterial and venous duplex studies done but does not recall the results no was ever treated for venous insufficiency. 06/10/2016 -- -- had a office visit at [facility] seen by the physicianos assistant [facility]. Patient  underwent an ABI which showed medial calcification, normal tibial artery Doppler waveform, PPG waveforms and toe brachial indices suggest normal arterial perfusion to bilateral lower extremities. right TBI was 0.97 and left was 0.84 Bilateral venous duplex was notable for venous reflux in the left greater saphenous vein and popliteal veins.. A left lower extremity endovenous laser ablation was recommended. 06/27/2016 -- I understand he spoke to his insurance company and there is a 3 month waiting period with conservative therapy prior to authorizing surgical intervention. 07/12/16- he is here for follow-up evaluation of a left lower extremity ulcer. He did bring his compression stockings today. He is voicing no complaints or concerns Readmission: 04/16/2021 upon evaluation today patient appears for reevaluation here in the clinic he was last seen in April 2018. At that time he was having issues with venous stasis ulcers and that again is what he is dealing with today as well. Fortunately I do not see any signs of active infection at this time  which is great news and overall very pleased in that regard. Nonetheless he does have some areas of eschar which is going require debridement to clear away some of the necrotic debris so that we can hopefully get this healed as quickly as possible. That was discussed with the patient today as well. The patient does have diabetes mellitus type 2 for which she does have a hemoglobin A1c of 7.27 November 2020. He also has been on Keflex for what was felt to be cellulitis that seems to be doing better these wounds began on March 08, 2021. The patient also has a history of lymphedema, chronic venous insufficiency, and is a kidney transplant patient. Electronic Signature(s) Signed: 04/16/2021 12:56:58 PM By: Worthy Keeler PA-C Entered By: Worthy Keeler on 04/16/2021 12:56:58 DUWAN, ADRIAN  ([id number]) -------------------------------------------------------------------------------- Physical Exam Details Patient Name: IZAIA, SAY. Date of Service: 04/16/2021 10:00 AM Medical Record Number: [id number] Patient Account Number: [id number] Date of Birth/Sex: 17-Nov-1960 (61 y.o. M) Treating RN: Donnamarie Poag Primary Care Provider: [name] Other Clinician: Referring Provider: Merlyn Lot Treating Provider/Extender: Skipper Cliche in Treatment: 0 Constitutional patient is hypertensive.. pulse regular and within target range for patient.Marland Kitchen respirations regular, non-labored and within target range for patient.Marland Kitchen temperature within target range for patient.. Well-nourished and well-hydrated in no acute distress. Eyes conjunctiva clear no eyelid edema noted. pupils equal round and reactive to light and accommodation. Ears, Nose, Mouth, and Throat no gross abnormality of ear auricles or external auditory canals. normal hearing noted during conversation. mucus membranes moist. Respiratory normal breathing without difficulty. Cardiovascular 2+ dorsalis pedis/posterior tibialis pulses. 1+ pitting edema of the bilateral lower extremities. Musculoskeletal normal gait and posture. no significant deformity or arthritic changes, no loss or range of motion, no clubbing. Psychiatric this patient is able to make decisions and demonstrates good insight into disease process. Alert and Oriented x 3. pleasant and cooperative. Notes Upon inspection patient's wound bed actually showed signs of good granulation and very little areas of this there was mainly eschar covering. I was actually able to perform debridement however to clear with no necrotic debris he tolerated this today without complication postdebridement wound bed appears to be doing much better and I am very pleased. I do believe that this has a good chance of healing with appropriate wound care measures. He has excellent  pulses although it has been 5 years since his last arterial study I do want to double check that he would like to as well for that reason I Georgina Peer going to send in a referral to vascular for an arterial study with ABI and TBI. Electronic Signature(s) Signed: 04/16/2021 12:57:37 PM By: Worthy Keeler PA-C Entered By: Worthy Keeler on 04/16/2021 12:57:37 Fregia, Dorothea Glassman ([id number]) -------------------------------------------------------------------------------- Physician Orders Details Patient Name: JANI, PLOEGER. Date of Service: 04/16/2021 10:00 AM Medical Record Number: [id number] Patient Account Number: [id number] Date of Birth/Sex: Aug 16, 1960 (61 y.o. M) Treating RN: Donnamarie Poag Primary Care Provider: [name] Other Clinician: Referring Provider: Merlyn Lot Treating Provider/Extender: Skipper Cliche in Treatment: 0 Verbal / Phone Orders: No Diagnosis Coding ICD-10 Coding Code Description E11.622 Type 2 diabetes mellitus with other skin ulcer I89.0 Lymphedema, not elsewhere classified L97.822 Non-pressure chronic ulcer of other part of left lower leg with fat layer exposed L97.812 Non-pressure chronic ulcer of other part of right lower leg with fat layer exposed I87.312 Chronic venous hypertension (idiopathic) with ulcer of left lower extremity I87.311 Chronic venous hypertension (idiopathic) with ulcer of  right lower extremity Z94.0 Kidney transplant status Follow-up Appointments o Return Appointment in 1 week. o Nurse Visit as needed Bathing/ Shower/ Hygiene o May shower; gently cleanse wound with antibacterial soap, rinse and pat dry prior to dressing wounds o May shower with wound dressing protected with water repellent cover or cast protector. o No tub bath. Anesthetic (Use 'Patient Medications' Section for Anesthetic Order Entry) o Lidocaine applied to wound bed Edema Control - Lymphedema / Segmental Compressive Device / Other o Optional:  One layer of unna paste to top of compression wrap (to act as an anchor). o Elevate leg(s) parallel to the floor when sitting. o DO YOUR BEST to sleep in the bed at night. DO NOT sleep in your recliner. Long hours of sitting in a recliner leads to swelling of the legs and/or potential wounds on your backside. Additional Orders / Instructions o Follow Nutritious Diet and Increase Protein Intake - continue to monitor blood sugar Wound Treatment Wound #3 - Lower Leg Wound Laterality: Left, Anterior Cleanser: Soap and Water 1 x Per Week/15 Days Discharge Instructions: Gently cleanse wound with antibacterial soap, rinse and pat dry prior to dressing wounds Cleanser: Wound Cleanser 1 x Per Week/15 Days Discharge Instructions: Wash your hands with soap and water. Remove old dressing, discard into plastic bag and place into trash. Cleanse the wound with Wound Cleanser prior to applying a clean dressing using gauze sponges, not tissues or cotton balls. Do not scrub or use excessive force. Pat dry using gauze sponges, not tissue or cotton balls. Primary Dressing: Silvercel 4 1/4x 4 1/4 (in/in) 1 x Per Week/15 Days Discharge Instructions: Apply Silvercel 4 1/4x 4 1/4 (in/in) as instructed Secondary Dressing: ABD Pad 5x9 (in/in) 1 x Per Week/15 Days Discharge Instructions: Cover with ABD pad Compression Wrap: Profore Lite LF 3 Multilayer Compression Bandaging System 1 x Per Week/15 Days Discharge Instructions: Apply 3 multi-layer wrap as prescribed. Wound #4 - Lower Leg Wound Laterality: Right, Medial, Anterior Cleanser: Soap and Water 1 x Per Week/15 Days Discharge Instructions: Gently cleanse wound with antibacterial soap, rinse and pat dry prior to dressing wounds JAECEON, MICHELIN F. ([id number]) Cleanser: Wound Cleanser 1 x Per Week/15 Days Discharge Instructions: Wash your hands with soap and water. Remove old dressing, discard into plastic bag and place into trash. Cleanse the wound with Wound  Cleanser prior to applying a clean dressing using gauze sponges, not tissues or cotton balls. Do not scrub or use excessive force. Pat dry using gauze sponges, not tissue or cotton balls. Primary Dressing: Silvercel 4 1/4x 4 1/4 (in/in) 1 x Per Week/15 Days Discharge Instructions: Apply Silvercel 4 1/4x 4 1/4 (in/in) as instructed Secondary Dressing: ABD Pad 5x9 (in/in) 1 x Per Week/15 Days Discharge Instructions: Cover with ABD pad Compression Wrap: Profore Lite LF 3 Multilayer Compression Bandaging System 1 x Per Week/15 Days Discharge Instructions: Apply 3 multi-layer wrap as prescribed. Wound #5 - Lower Leg Wound Laterality: Right, Lateral, Anterior Cleanser: Soap and Water 1 x Per Week/15 Days Discharge Instructions: Gently cleanse wound with antibacterial soap, rinse and pat dry prior to dressing wounds Cleanser: Wound Cleanser 1 x Per Week/15 Days Discharge Instructions: Wash your hands with soap and water. Remove old dressing, discard into plastic bag and place into trash. Cleanse the wound with Wound Cleanser prior to applying a clean dressing using gauze sponges, not tissues or cotton balls. Do not scrub or use excessive force. Pat dry using gauze sponges, not tissue or cotton balls. Primary Dressing:  Silvercel 4 1/4x 4 1/4 (in/in) 1 x Per Week/15 Days Discharge Instructions: Apply Silvercel 4 1/4x 4 1/4 (in/in) as instructed Secondary Dressing: ABD Pad 5x9 (in/in) 1 x Per Week/15 Days Discharge Instructions: Cover with ABD pad Compression Wrap: Profore Lite LF 3 Multilayer Compression Bandaging System 1 x Per Week/15 Days Discharge Instructions: Apply 3 multi-layer wrap as prescribed. Consults o Vascular - bilateral ABI/TBI Electronic Signature(s) Signed: 04/16/2021 4:33:32 PM By: Donnamarie Poag Signed: 04/16/2021 4:34:41 PM By: Worthy Keeler PA-C Entered By: Donnamarie Poag on 04/16/2021 11:31:47 Umble, Dorothea Glassman  ([id number]) -------------------------------------------------------------------------------- Problem List Details Patient Name: LEJON, AFZAL. Date of Service: 04/16/2021 10:00 AM Medical Record Number: [id number] Patient Account Number: [id number] Date of Birth/Sex: 08/28/1960 (61 y.o. M) Treating RN: Donnamarie Poag Primary Care Provider: [name] Other Clinician: Referring Provider: Merlyn Lot Treating Provider/Extender: Skipper Cliche in Treatment: 0 Active Problems ICD-10 Encounter Code Description Active Date MDM Diagnosis E11.622 Type 2 diabetes mellitus with other skin ulcer 04/16/2021 No Yes I89.0 Lymphedema, not elsewhere classified 04/16/2021 No Yes L97.822 Non-pressure chronic ulcer of other part of left lower leg with fat layer 04/16/2021 No Yes exposed L97.812 Non-pressure chronic ulcer of other part of right lower leg with fat layer 04/16/2021 No Yes exposed I87.312 Chronic venous hypertension (idiopathic) with ulcer of left lower 04/16/2021 No Yes extremity I87.311 Chronic venous hypertension (idiopathic) with ulcer of right lower 04/16/2021 No Yes extremity Z94.0 Kidney transplant status 04/16/2021 No Yes Inactive Problems Resolved Problems Electronic Signature(s) Signed: 04/16/2021 11:25:56 AM By: Worthy Keeler PA-C Entered By: Worthy Keeler on 04/16/2021 11:25:56 Coverdale, Meziah F. ([id number]) -------------------------------------------------------------------------------- Progress Note Details Patient Name: [name] Date of Service: 04/16/2021 10:00 AM Medical Record Number: [id number] Patient Account Number: [id number] Date of Birth/Sex: 01-06-1961 (61 y.o. M) Treating RN: Donnamarie Poag Primary Care Provider: [name] Other Clinician: Referring Provider: Merlyn Lot Treating Provider/Extender: Skipper Cliche in Treatment: 0 Subjective Chief Complaint Information obtained from Patient Bilateral LE Ulcers History of Present  Illness (HPI) The following HPI elements were documented for the patient's wound: Location: left anterior shin and left medial calf Quality: Patient reports experiencing a dull pain to affected area(s). Severity: Patient states wound are getting worse. Duration: Patient has had the wound for > 3 months prior to seeking treatment at the wound center Timing: Pain in wound is Intermittent (comes and goes Context: The wound appeared gradually over time Modifying Factors: Other treatment(s) tried include:had surgery on the left anterior shin for a hematoma which has been nonhealing for the last 8 months Associated Signs and Symptoms: Patient reports having increase swelling. 61 year old diabetic was last hemoglobin A1c was 7.2% has been reviewed today with 2 ulcerated areas in his left lower extremity which she's had for about 8 months and the one most recently was started as a blister for 2 weeks. he has a past medical history of atrial fibrillation, coronary artery disease, CHF, chronic venous stasis dermatitis, diabetes mellitus, hypertension, sleep apnea and is status post kidney transplant in 2012. He is a former smoker and quit cigars in 1996. In the remote past he's had some arterial and venous duplex studies done but does not recall the results no was ever treated for venous insufficiency. 06/10/2016 -- -- had a office visit at [facility] seen by the physician s assistant Marcelle Overlie. Patient underwent an ABI which showed medial calcification, normal tibial artery Doppler waveform, PPG waveforms and toe brachial indices suggest normal arterial perfusion  to bilateral lower extremities. right TBI was 0.97 and left was 0.84 Bilateral venous duplex was notable for venous reflux in the left greater saphenous vein and popliteal veins.. A left lower extremity endovenous laser ablation was recommended. 06/27/2016 -- I understand he spoke to his insurance company and there is a  3 month waiting period with conservative therapy prior to authorizing surgical intervention. 07/12/16- he is here for follow-up evaluation of a left lower extremity ulcer. He did bring his compression stockings today. He is voicing no complaints or concerns Readmission: 04/16/2021 upon evaluation today patient appears for reevaluation here in the clinic he was last seen in April 2018. At that time he was having issues with venous stasis ulcers and that again is what he is dealing with today as well. Fortunately I do not see any signs of active infection at this time which is great news and overall very pleased in that regard. Nonetheless he does have some areas of eschar which is going require debridement to clear away some of the necrotic debris so that we can hopefully get this healed as quickly as possible. That was discussed with the patient today as well. The patient does have diabetes mellitus type 2 for which she does have a hemoglobin A1c of 7.27 November 2020. He also has been on Keflex for what was felt to be cellulitis that seems to be doing better these wounds began on March 08, 2021. The patient also has a history of lymphedema, chronic venous insufficiency, and is a kidney transplant patient. Patient History Information obtained from Patient. Allergies morphine Family History Cancer - Maternal Grandparents,Paternal Grandparents, Diabetes - Maternal Grandparents, Heart Disease - Father, Kidney Disease - Father. Social History Never smoker - NO CIGARETTES, Marital Status - Married, Alcohol Use - Never, Drug Use - No History, Caffeine Use - Daily. Medical History Eyes Patient has history of Cataracts - HX Hematologic/Lymphatic Patient has history of Lymphedema BRAVERY, KETCHAM. ([id number]) Respiratory Patient has history of Asthma, Sleep Apnea Cardiovascular Patient has history of Arrhythmia - A FIB, Congestive Heart Failure, Coronary Artery Disease,  Hypertension Endocrine Patient has history of Type II Diabetes Genitourinary Patient has history of End Stage Renal Disease Musculoskeletal Patient has history of Gout, Osteoarthritis Neurologic Patient has history of Neuropathy Patient is treated with Insulin. Blood sugar is tested. Blood sugar results noted at the following times: Breakfast - 132. Review of Systems (ROS) Constitutional Symptoms (General Health) Denies complaints or symptoms of Fatigue, Fever, Chills, Marked Weight Change. Ear/Nose/Mouth/Throat Denies complaints or symptoms of Difficult clearing ears, Sinusitis. Cardiovascular Complains or has symptoms of LE edema. Gastrointestinal Denies complaints or symptoms of Frequent diarrhea, Nausea, Vomiting. Genitourinary Complains or has symptoms of Kidney failure/ Dialysis - HX-FISTULA STILL IN LEFT ARM, KIDNEY TRANSPLANT Immunological Denies complaints or symptoms of Hives, Itching. Integumentary (Skin) Complains or has symptoms of Wounds - HX LEG TRAUMA, Swelling. Denies complaints or symptoms of Breakdown. Psychiatric Complains or has symptoms of Anxiety. Objective Constitutional patient is hypertensive.. pulse regular and within target range for patient.Marland Kitchen respirations regular, non-labored and within target range for patient.Marland Kitchen temperature within target range for patient.. Well-nourished and well-hydrated in no acute distress. Vitals Time Taken: 10:30 AM, Height: 70 in, Source: Stated, Weight: 270 lbs, Source: Measured, BMI: 38.7, Temperature: 97.7 F, Pulse: 58 bpm, Respiratory Rate: 16 breaths/min, Blood Pressure: 150/72 mmHg. Eyes conjunctiva clear no eyelid edema noted. pupils equal round and reactive to light and accommodation. Ears, Nose, Mouth, and Throat no gross abnormality of ear  auricles or external auditory canals. normal hearing noted during conversation. mucus membranes moist. Respiratory normal breathing without difficulty. Cardiovascular 2+  dorsalis pedis/posterior tibialis pulses. 1+ pitting edema of the bilateral lower extremities. Musculoskeletal normal gait and posture. no significant deformity or arthritic changes, no loss or range of motion, no clubbing. Psychiatric this patient is able to make decisions and demonstrates good insight into disease process. Alert and Oriented x 3. pleasant and cooperative. General Notes: Upon inspection patient's wound bed actually showed signs of good granulation and very little areas of this there was mainly eschar covering. I was actually able to perform debridement however to clear with no necrotic debris he tolerated this today without complication postdebridement wound bed appears to be doing much better and I am very pleased. I do believe that this has a good chance of healing with appropriate wound care measures. He has excellent pulses although it has been 5 years since his last arterial study I do want to double check that he would like to as well for that reason I Georgina Peer going to send in a referral to vascular for an arterial study with ABI and TBI. Integumentary (Hair, Skin) Fetty, Desjuan F. ([id number]) Wound #3 status is Open. Original cause of wound was Blister. The date acquired was: 03/08/2021. The wound is located on the Left,Anterior Lower Leg. The wound measures 3.8cm length x 1.6cm width x 0.1cm depth; 4.775cm^2 area and 0.478cm^3 volume. There is Fat Layer (Subcutaneous Tissue) exposed. There is no tunneling or undermining noted. There is a medium amount of serosanguineous drainage noted. There is small (1-33%) pink granulation within the wound bed. There is a large (67-100%) amount of necrotic tissue within the wound bed including Eschar. Wound #4 status is Open. Original cause of wound was Blister. The date acquired was: 03/08/2021. The wound is located on the Right,Medial,Anterior Lower Leg. The wound measures 1.9cm length x 1.5cm width x 0.1cm depth; 2.238cm^2 area and  0.224cm^3 volume. There is Fat Layer (Subcutaneous Tissue) exposed. There is no tunneling or undermining noted. There is a medium amount of serosanguineous drainage noted. There is no granulation within the wound bed. There is a large (67-100%) amount of necrotic tissue within the wound bed including Eschar. Wound #5 status is Open. Original cause of wound was Blister. The date acquired was: 03/08/2021. The wound is located on the Right,Lateral,Anterior Lower Leg. The wound measures 0.8cm length x 0.6cm width x 0.1cm depth; 0.377cm^2 area and 0.038cm^3 volume. There is Fat Layer (Subcutaneous Tissue) exposed. There is no tunneling or undermining noted. There is a medium amount of serosanguineous drainage noted. There is no granulation within the wound bed. There is a large (67-100%) amount of necrotic tissue within the wound bed including Eschar and Adherent Slough. 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 There was a Excisional Skin/Subcutaneous Tissue Debridement with a total area of 6.08 sq cm performed by Tommie Sams., PA-C. With the following instrument(s): Curette to remove Viable and Non-Viable tissue/material. Material  removed includes Subcutaneous Tissue and Slough and after achieving pain control using Lidocaine. A time out was conducted at 11:29, prior to the start of the procedure. A  Minimum amount of bleeding was controlled with Pressure. The procedure was tolerated well. Post Debridement Measurements: 3.8cm length x 1.6cm width x 0.1cm depth; 0.478cm^3 volume. Character of Wound/Ulcer Post Debridement is improved. Severity of Tissue Post Debridement is: Fat layer exposed. Post procedure Diagnosis Wound #3: Same as Pre-Procedure Pre-procedure diagnosis of Wound #3 is a Venous Leg Ulcer located on the Left,Anterior Lower Leg . There was a Three Layer Compression Therapy Procedure by Donnamarie Poag, RN. Post procedure Diagnosis Wound #3: Same as Pre-Procedure Wound #4 Pre-procedure diagnosis of Wound #4 is a Venous Leg Ulcer located on the Right,Medial,Anterior Lower Leg .Severity of Tissue Pre Debridement is: Fat layer exposed. There was a Excisional Skin/Subcutaneous Tissue Debridement with a total area of 2.85 sq cm performed by Tommie Sams., PA-C. With the following instrument(s): Curette to remove Viable and Non-Viable tissue/material. Material removed includes Subcutaneous Tissue and Slough and after achieving pain control using Lidocaine. A time out was conducted at 11:29, prior to the start of the procedure. A Minimum amount of bleeding was controlled with Pressure. The procedure was tolerated well. Post Debridement Measurements: 2cm length x 1.6cm width x 0.1cm depth; 0.251cm^3 volume. Character of Wound/Ulcer Post Debridement is improved. Severity of Tissue Post Debridement is: Fat layer exposed. Post procedure Diagnosis Wound #4: Same as Pre-Procedure Pre-procedure diagnosis of Wound #4 is a Venous Leg Ulcer located on the Right,Medial,Anterior Lower Leg . There was a Three Layer Compression Therapy Procedure by Donnamarie Poag, RN. Post procedure Diagnosis Wound #4: Same as Pre-Procedure Wound #5 Pre-procedure diagnosis of Wound #5 is a Venous Leg Ulcer located on the Right,Lateral,Anterior Lower Leg .Severity of Tissue Pre Debridement is: Fat layer exposed. There was  a Excisional Skin/Subcutaneous Tissue Debridement with a total area of 0.48 sq cm performed by Tommie Sams., PA-C. With the following instrument(s): Curette to remove Viable and Non-Viable tissue/material. Material removed includes Subcutaneous Tissue MYCHAL, DECARLO F. ([id number]) and Slough and after achieving pain control using Lidocaine. A time out was conducted at 11:29, prior to the start of the procedure. A Minimum amount of bleeding was controlled with Pressure. The procedure was tolerated well. Post Debridement Measurements: 0.3cm length x 0.4cm width x 0.1cm depth; 0.009cm^3 volume. Character of Wound/Ulcer Post Debridement is improved. Severity of Tissue Post Debridement is: Fat layer exposed. Post procedure Diagnosis Wound #5: Same as Pre-Procedure Plan Follow-up Appointments: Return Appointment in 1 week. Nurse Visit as needed Bathing/ Shower/ Hygiene: May shower; gently cleanse wound with antibacterial soap, rinse and pat dry prior to dressing wounds May shower with wound dressing protected with water repellent cover or cast protector. No tub bath. Anesthetic (Use 'Patient Medications' Section for Anesthetic Order Entry): Lidocaine applied to wound bed Edema Control - Lymphedema / Segmental Compressive Device / Other: Optional: One layer of unna paste to top of compression wrap (to act as an anchor). Elevate leg(s) parallel to the floor when sitting. DO YOUR BEST to sleep in the bed at night. DO NOT sleep in your recliner. Long hours of sitting in a recliner leads to swelling of the legs and/or potential wounds on your backside. Additional Orders / Instructions: Follow Nutritious Diet and Increase Protein Intake - continue to monitor blood sugar Consults ordered were: Vascular - bilateral ABI/TBI WOUND #3: - Lower Leg Wound Laterality: Left, Anterior Cleanser: Soap and  Water 1 x Per Week/15 Days Discharge Instructions: Gently cleanse wound with antibacterial soap, rinse  and pat dry prior to dressing wounds Cleanser: Wound Cleanser 1 x Per Week/15 Days Discharge Instructions: Wash your hands with soap and water. Remove old dressing, discard into plastic bag and place into trash. Cleanse the wound with Wound Cleanser prior to applying a clean dressing using gauze sponges, not tissues or cotton balls. Do not scrub or use excessive force. Pat dry using gauze sponges, not tissue or cotton balls. Primary Dressing: Silvercel 4 1/4x 4 1/4 (in/in) 1 x Per Week/15 Days Discharge Instructions: Apply Silvercel 4 1/4x 4 1/4 (in/in) as instructed Secondary Dressing: ABD Pad 5x9 (in/in) 1 x Per Week/15 Days Discharge Instructions: Cover with ABD pad Compression Wrap: Profore Lite LF 3 Multilayer Compression Bandaging System 1 x Per Week/15 Days Discharge Instructions: Apply 3 multi-layer wrap as prescribed. WOUND #4: - Lower Leg Wound Laterality: Right, Medial, Anterior Cleanser: Soap and Water 1 x Per Week/15 Days Discharge Instructions: Gently cleanse wound with antibacterial soap, rinse and pat dry prior to dressing wounds Cleanser: Wound Cleanser 1 x Per Week/15 Days Discharge Instructions: Wash your hands with soap and water. Remove old dressing, discard into plastic bag and place into trash. Cleanse the wound with Wound Cleanser prior to applying a clean dressing using gauze sponges, not tissues or cotton balls. Do not scrub or use excessive force. Pat dry using gauze sponges, not tissue or cotton balls. Primary Dressing: Silvercel 4 1/4x 4 1/4 (in/in) 1 x Per Week/15 Days Discharge Instructions: Apply Silvercel 4 1/4x 4 1/4 (in/in) as instructed Secondary Dressing: ABD Pad 5x9 (in/in) 1 x Per Week/15 Days Discharge Instructions: Cover with ABD pad Compression Wrap: Profore Lite LF 3 Multilayer Compression Bandaging System 1 x Per Week/15 Days Discharge Instructions: Apply 3 multi-layer wrap as prescribed. WOUND #5: - Lower Leg Wound Laterality: Right, Lateral,  Anterior Cleanser: Soap and Water 1 x Per Week/15 Days Discharge Instructions: Gently cleanse wound with antibacterial soap, rinse and pat dry prior to dressing wounds Cleanser: Wound Cleanser 1 x Per Week/15 Days Discharge Instructions: Wash your hands with soap and water. Remove old dressing, discard into plastic bag and place into trash. Cleanse the wound with Wound Cleanser prior to applying a clean dressing using gauze sponges, not tissues or cotton balls. Do not scrub or use excessive force. Pat dry using gauze sponges, not tissue or cotton balls. Primary Dressing: Silvercel 4 1/4x 4 1/4 (in/in) 1 x Per Week/15 Days Discharge Instructions: Apply Silvercel 4 1/4x 4 1/4 (in/in) as instructed Secondary Dressing: ABD Pad 5x9 (in/in) 1 x Per Week/15 Days Discharge Instructions: Cover with ABD pad Compression Wrap: Profore Lite LF 3 Multilayer Compression Bandaging System 1 x Per Week/15 Days Discharge Instructions: Apply 3 multi-layer wrap as prescribed. 1. I would recommend him go ahead and initiate treatment with a 3 layer compression wrap which I think will do well to help keep the edema under control I think he should support this from an arterial status based on what I see. 2. I am also can recommend that we use silver salve over the wounds to help catch any drainage. SENA, CLOUATRE F. ([id number]) 3. We will cover this with an ABD pad. 4. I am also can recommend the patient should be elevating his legs much as possible to help with edema control. We will see patient back for reevaluation in 1 week here in the clinic. If anything worsens or changes patient will contact  our office for additional recommendations. Electronic Signature(s) Signed: 04/16/2021 12:58:04 PM By: Worthy Keeler PA-C Entered By: Worthy Keeler on 04/16/2021 12:58:04 [name] ([id number]) -------------------------------------------------------------------------------- ROS/PFSH Details Patient Name: ZAN, ORLICK. Date of Service: 04/16/2021 10:00 AM Medical Record Number: [id number] Patient Account Number: [id number] Date of Birth/Sex: 1960/08/21 (61 y.o. M) Treating RN: Donnamarie Poag Primary Care Provider: [name] Other Clinician: Referring Provider: Merlyn Lot Treating Provider/Extender: Skipper Cliche in Treatment: 0 Information Obtained From Patient Constitutional Symptoms (General Health) Complaints and Symptoms: Negative for: Fatigue; Fever; Chills; Marked Weight Change Ear/Nose/Mouth/Throat Complaints and Symptoms: Negative for: Difficult clearing ears; Sinusitis Cardiovascular Complaints and Symptoms: Positive for: LE edema Medical History: Positive for: Arrhythmia - A FIB; Congestive Heart Failure; Coronary Artery Disease; Hypertension Gastrointestinal Complaints and Symptoms: Negative for: Frequent diarrhea; Nausea; Vomiting Genitourinary Complaints and Symptoms: Positive for: Kidney failure/ Dialysis - HX-FISTULA STILL IN LEFT ARM Review of System Notes: KIDNEY TRANSPLANT Medical History: Positive for: End Stage Renal Disease Immunological Complaints and Symptoms: Negative for: Hives; Itching Integumentary (Skin) Complaints and Symptoms: Positive for: Wounds - HX LEG TRAUMA; Swelling Negative for: Breakdown Psychiatric Complaints and Symptoms: Positive for: Anxiety Eyes Medical History: Positive for: Cataracts - HX Hematologic/Lymphatic NIV, DARLEY ([id number]) Medical History: Positive for: Lymphedema Respiratory Medical History: Positive for: Asthma; Sleep Apnea Endocrine Medical History: Positive for: Type II Diabetes Time with diabetes: 1997 Treated with: Insulin Blood sugar tested every day: Yes Tested : 3xs a day Blood sugar testing results: Breakfast: 132 Musculoskeletal Medical History: Positive for: Gout; Osteoarthritis Neurologic Medical History: Positive for: Neuropathy Oncologic HBO Extended History  Items Eyes: Cataracts Immunizations Pneumococcal Vaccine: Received Pneumococcal Vaccination: Yes Received Pneumococcal Vaccination On or After 60th Birthday: Yes Implantable Devices None Family and Social History Cancer: Yes - Maternal Grandparents,Paternal Grandparents; Diabetes: Yes - Maternal Grandparents; Heart Disease: Yes - Father; Kidney Disease: Yes - Father; Never smoker - NO CIGARETTES; Marital Status - Married; Alcohol Use: Never; Drug Use: No History; Caffeine Use: Daily; Financial Concerns: No; Food, Clothing or Shelter Needs: No; Support System Lacking: No; Transportation Concerns: No Electronic Signature(s) Signed: 04/16/2021 4:33:32 PM By: Donnamarie Poag Signed: 04/16/2021 4:34:41 PM By: Worthy Keeler PA-C Entered By: Donnamarie Poag on 04/16/2021 10:37:48 Charbonneau, Dorothea Glassman ([id number]) -------------------------------------------------------------------------------- SuperBill Details Patient Name: [name]. Date of Service: 04/16/2021 Medical Record Number: [id number] Patient Account Number: [id number] Date of Birth/Sex: 1961-03-10 (61 y.o. M) Treating RN: Donnamarie Poag Primary Care Provider: [name] Other Clinician: Referring Provider: Merlyn Lot Treating Provider/Extender: Skipper Cliche in Treatment: 0 Diagnosis Coding ICD-10 Codes Code Description E11.622 Type 2 diabetes mellitus with other skin ulcer I89.0 Lymphedema, not elsewhere classified L97.822 Non-pressure chronic ulcer of other part of left lower leg with fat layer exposed L97.812 Non-pressure chronic ulcer of other part of right lower leg with fat layer exposed I87.312 Chronic venous hypertension (idiopathic) with ulcer of left lower extremity I87.311 Chronic venous hypertension (idiopathic) with ulcer of right lower extremity Z94.0 Kidney transplant status Facility Procedures CPT4 Code: 10258527 Description: 99213 - WOUND CARE VISIT-LEV 3 EST PT Modifier: Quantity: 1 CPT4 Code:  78242353 Description: 11042 - DEB SUBQ TISSUE 20 SQ CM/< Modifier: Quantity: 1 CPT4 Code: Description: ICD-10 Diagnosis Description L97.822 Non-pressure chronic ulcer of other part of left lower leg with fat layer L97.812 Non-pressure chronic ulcer of other part of right lower leg with fat laye Modifier: exposed r exposed Quantity: Physician Procedures CPT4 Code: 6144315 Description: 40086 - WC PHYS LEVEL 4 -  NEW PT Modifier: 25 Quantity: 1 CPT4 Code: Description: ICD-10 Diagnosis Description E11.622 Type 2 diabetes mellitus with other skin ulcer I89.0 Lymphedema, not elsewhere classified L97.822 Non-pressure chronic ulcer of other part of left lower leg with fat laye L97.812 Non-pressure chronic  ulcer of other part of right lower leg with fat lay Modifier: r exposed er exposed Quantity: CPT4 Code: 5973312 Description: 11042 - WC PHYS SUBQ TISS 20 SQ CM Modifier: Quantity: 1 CPT4 Code: Description: ICD-10 Diagnosis Description L97.822 Non-pressure chronic ulcer of other part of left lower leg with fat laye L97.812 Non-pressure chronic ulcer of other part of right lower leg with fat lay Modifier: r exposed er exposed Quantity: Electronic Signature(s) Signed: 04/16/2021 12:58:25 PM By: Worthy Keeler PA-C Entered By: Worthy Keeler on 04/16/2021 12:58:25

## 2021-04-16 NOTE — Progress Notes (Signed)
JUSTIS, CLOSSER ([id number]) Visit Report for 04/16/2021 Abuse Risk Screen Details Patient Name: [name], [name]. Date of Service: 04/16/2021 10:00 AM Medical Record Number: [id number] Patient Account Number: [id number] Date of Birth/Sex: February 10, 1961 (61 y.o. M) Treating RN: Donnamarie Poag Primary Care Kaylon Hitz: [name] Other Clinician: Referring Judianne Seiple: Merlyn Lot Treating Kaitlinn Iversen/Extender: Skipper Cliche in Treatment: 0 Abuse Risk Screen Items Answer ABUSE RISK SCREEN: Has anyone close to you tried to hurt or harm you recentlyo No Do you feel uncomfortable with anyone in your familyo No Has anyone forced you do things that you didnot want to doo No Electronic Signature(s) Signed: 04/16/2021 4:33:32 PM By: Donnamarie Poag Entered By: Donnamarie Poag on 04/16/2021 10:37:58 Calixto, Dorothea Glassman ([id number]) -------------------------------------------------------------------------------- Activities of Daily Living Details Patient Name: Carl, [name]. Date of Service: 04/16/2021 10:00 AM Medical Record Number: [id number] Patient Account Number: [id number] Date of Birth/Sex: [birth year] (61 y.o. M) Treating RN: Donnamarie Poag Primary Care Demar Shad: [name] Other Clinician: Referring Cambell Stanek: Merlyn Lot Treating Kristell Wooding/Extender: Skipper Cliche in Treatment: 0 Activities of Daily Living Items Answer Activities of Daily Living (Please select one for each item) Drive Automobile Completely Able Take Medications Completely Able Use Telephone Completely Able Care for Appearance Completely Able Use Toilet Completely Able Bath / Shower Completely Able Dress Self Completely Able Feed Self Completely Able Walk Completely Able Get In / Out Bed Completely Able Housework Completely Able Prepare Meals Completely Able Handle Money Completely Able Shop for Self Completely Able Electronic Signature(s) Signed: 04/16/2021 4:33:32 PM By: Donnamarie Poag Entered By: Donnamarie Poag on  04/16/2021 10:38:21 Christoph, Dorothea Glassman ([id number]) -------------------------------------------------------------------------------- Education Screening Details Patient Name: [name]. Date of Service: 04/16/2021 10:00 AM Medical Record Number: [id number] Patient Account Number: [id number] Date of Birth/Sex: 1960-05-09 (61 y.o. M) Treating RN: Donnamarie Poag Primary Care Lorrena Goranson: [name] Other Clinician: Referring Braidon Chermak: Merlyn Lot Treating Chirsty Armistead/Extender: Skipper Cliche in Treatment: 0 Primary Learner Assessed: Patient Learning Preferences/Education Level/Primary Language Learning Preference: Explanation Highest Education Level: College or Above Preferred Language: English Cognitive Barrier Language Barrier: No Translator Needed: No Memory Deficit: No Emotional Barrier: No Cultural/Religious Beliefs Affecting Medical Care: No Physical Barrier Impaired Vision: No Impaired Hearing: No Decreased Hand dexterity: No Knowledge/Comprehension Knowledge Level: High Comprehension Level: High Ability to understand written instructions: High Ability to understand verbal instructions: High Motivation Anxiety Level: Calm Cooperation: Cooperative Education Importance: Acknowledges Need Interest in Health Problems: Asks Questions Perception: Coherent Willingness to Engage in Self-Management High Activities: Readiness to Engage in Self-Management High Activities: Electronic Signature(s) Signed: 04/16/2021 4:33:32 PM By: Donnamarie Poag Entered ByDonnamarie Poag on 04/16/2021 10:38:44 Dubois, Dorothea Glassman ([id number]) -------------------------------------------------------------------------------- Fall Risk Assessment Details Patient Name: [name]. Date of Service: 04/16/2021 10:00 AM Medical Record Number: [id number] Patient Account Number: [id number] Date of Birth/Sex: June 12, 1960 (61 y.o. M) Treating RN: Donnamarie Poag Primary Care Salwa Bai: [name] Other  Clinician: Referring Teia Freitas: Merlyn Lot Treating Dejia Ebron/Extender: Skipper Cliche in Treatment: 0 Fall Risk Assessment Items Have you had 2 or more falls in the last 12 monthso 0 Yes Have you had any fall that resulted in injury in the last 12 monthso 0 Yes FALLS RISK SCREEN History of falling - immediate or within 3 months 25 Yes Secondary diagnosis (Do you have 2 or more medical diagnoseso) 0 No Ambulatory aid None/bed rest/wheelchair/nurse 0 Yes Crutches/cane/walker 0 No Furniture 0 No Intravenous therapy Access/Saline/Heparin Lock 0 No Gait/Transferring Normal/ bed rest/ wheelchair 0 Yes Weak (short steps with or without shuffle,  stooped but able to lift head while walking, may 0 No seek support from furniture) Impaired (short steps with shuffle, may have difficulty arising from chair, head down, impaired 0 No balance) Mental Status Oriented to own ability 0 Yes Electronic Signature(s) Signed: 04/16/2021 4:33:32 PM By: Donnamarie Poag Entered By: Donnamarie Poag on 04/16/2021 10:38:59 Pensyl, Dorothea Glassman ([id number]) -------------------------------------------------------------------------------- Foot Assessment Details Patient Name: Carl Speller F. Date of Service: 04/16/2021 10:00 AM Medical Record Number: [id number] Patient Account Number: [id number] Date of Birth/Sex: Jul 03, 1960 (61 y.o. M) Treating RN: Donnamarie Poag Primary Care Mialee Weyman: [name] Other Clinician: Referring Rich Paprocki: Merlyn Lot Treating Amarri Michaelson/Extender: Skipper Cliche in Treatment: 0 Foot Assessment Items Site Locations + = Sensation present, - = Sensation absent, C = Callus, U = Ulcer R = Redness, W = Warmth, M = Maceration, PU = Pre-ulcerative lesion F = Fissure, S = Swelling, D = Dryness Assessment Right: Left: Other Deformity: No No Prior Foot Ulcer: No No Prior Amputation: No No Charcot Joint: No No Ambulatory Status: Ambulatory Without Help Gait: Steady Electronic  Signature(s) Signed: 04/16/2021 4:33:32 PM By: Donnamarie Poag Entered By: Donnamarie Poag on 04/16/2021 10:43:04 Sahagian, Dorothea Glassman ([id number]) -------------------------------------------------------------------------------- Nutrition Risk Screening Details Patient Name: Carl Speller F. Date of Service: 04/16/2021 10:00 AM Medical Record Number: [id number] Patient Account Number: [id number] Date of Birth/Sex: [birth year] (61 y.o. M) Treating RN: Donnamarie Poag Primary Care Binnie Droessler: [name] Other Clinician: Referring Floy Riegler: Merlyn Lot Treating Tangee Marszalek/Extender: Skipper Cliche in Treatment: 0 Height (in): 70 Weight (lbs): 270 Body Mass Index (BMI): 38.7 Nutrition Risk Screening Items Score Screening NUTRITION RISK SCREEN: I have an illness or condition that made me change the kind and/or amount of food I eat 0 No I eat fewer than two meals per day 0 No I eat few fruits and vegetables, or milk products 0 No I have three or more drinks of beer, liquor or wine almost every day 0 No I have tooth or mouth problems that make it hard for me to eat 0 No I don't always have enough money to buy the food I need 0 No I eat alone most of the time 0 No I take three or more different prescribed or over-the-counter drugs a day 1 Yes Without wanting to, I have lost or gained 10 pounds in the last six months 0 No I am not always physically able to shop, cook and/or feed myself 0 No Nutrition Protocols Good Risk Protocol 0 No interventions needed Moderate Risk Protocol High Risk Proctocol Risk Level: Good Risk Score: 1 Electronic Signature(s) Signed: 04/16/2021 4:33:32 PM By: Donnamarie Poag Entered ByDonnamarie Poag on 04/16/2021 10:39:17

## 2021-04-16 NOTE — Progress Notes (Signed)
RAVIS, HERNE ([id number]) Visit Report for 04/16/2021 Allergy List Details Patient Name: [name], [name]. Date of Service: 04/16/2021 10:00 AM Medical Record Number: [id number] Patient Account Number: [id number] Date of Birth/Sex: Dec 22, 1960 (61 y.o. M) Treating RN: Donnamarie Poag Primary Care Summerlyn Fickel: [name] Other Clinician: Referring Ridgely Anastacio: Merlyn Lot Treating Kaylin Schellenberg/Extender: Jeri Cos Weeks in Treatment: 0 Allergies Active Allergies morphine Allergy Notes Electronic Signature(s) Signed: 04/16/2021 4:33:32 PM By: Donnamarie Poag Entered By: Donnamarie Poag on 04/16/2021 10:34:14 [name] ([id number]) -------------------------------------------------------------------------------- Arrival Information Details Patient Name: [name]. Date of Service: 04/16/2021 10:00 AM Medical Record Number: [id number] Patient Account Number: [id number] Date of Birth/Sex: [birth year] (62 y.o. M) Treating RN: Donnamarie Poag Primary Care Athena Baltz: [name] Other Clinician: Referring Riva Sesma: Merlyn Lot Treating Zaiden Ludlum/Extender: Skipper Cliche in Treatment: 0 Visit Information Patient Arrived: Ambulatory Arrival Time: 10:30 Accompanied By: self Transfer Assistance: None Patient Identification Verified: Yes Secondary Verification Process Completed: Yes Patient Requires Transmission-Based No Precautions: Patient Has Alerts: Yes Patient Alerts: Patient on Blood Thinner DIABET[facility] [place] left leg 04/16/21 ELIQUIS History Since Last Visit Electronic Signature(s) Signed: 04/16/2021 11:09:24 AM By: Donnamarie Poag Entered By: Donnamarie Poag on 04/16/2021 11:09:24 [name] ([id number]) -------------------------------------------------------------------------------- Clinic Level of Care Assessment Details Patient Name: [name]. Date of Service: 04/16/2021 10:00 AM Medical Record Number: [id number] Patient Account Number: [id number] Date of Birth/Sex:  08/18/1960 (61 y.o. M) Treating RN: Donnamarie Poag Primary Care Emannuel Vise: [name] Other Clinician: Referring Raydell Maners: Merlyn Lot Treating Quintarius Ferns/Extender: Skipper Cliche in Treatment: 0 Clinic Level of Care Assessment Items TOOL 1 Quantity Score _0  - Use when EandM and Procedure is performed on INITIAL visit 0 ASSESSMENTS - Nursing Assessment / Reassessment X - General Physical Exam (combine w/ comprehensive assessment (listed just below) when performed on new 1 20 pt. evals) X- 1 25 Comprehensive Assessment (HX, ROS, Risk Assessments, Wounds Hx, etc.) ASSESSMENTS - Wound and Skin Assessment / Reassessment _1  - Dermatologic / Skin Assessment (not related to wound area) 0 ASSESSMENTS - Ostomy and/or Continence Assessment and Care _2  - Incontinence Assessment and Management 0 _3  - 0 Ostomy Care Assessment and Management (repouching, etc.) PROCESS - Coordination of Care X - Simple Patient / Family Education for ongoing care 1 15 _4  - 0 Complex (extensive) Patient / Family Education for ongoing care X- 1 10 Staff obtains Consents, Records, Test Results / Process Orders X- 1 10 Staff telephones HHA, Nursing Homes / Clarify orders / etc _5  - 0 Routine Transfer to another Facility (non-emergent condition) _6  - 0 Routine Hospital Admission (non-emergent condition) X- 1 15 New Admissions / Biomedical engineer / Ordering NPWT, Apligraf, etc. _7  - 0 Emergency Hospital Admission (emergent condition) PROCESS - Special Needs _8  - Pediatric / Minor Patient Management 0 _9  - 0 Isolation Patient Management _10  - 0 Hearing / Language / Visual special needs _11  - 0 Assessment of Community assistance (transportation, D/C planning, etc.) _12  - 0 Additional assistance / Altered mentation _13  - 0 Support Surface(s) Assessment (bed, cushion, seat, etc.) INTERVENTIONS - Miscellaneous _14  - External ear exam 0 _15  - 0 Patient Transfer (multiple staff / Civil Service fast streamer / Similar  devices) _16  - 0 Simple Staple / Suture removal (25 or less) _17  - 0 Complex Staple / Suture removal (26 or more) _18  - 0 Hypo/Hyperglycemic Management (do not check if billed separately) X- 1 15 Ankle / Brachial Index (ABI) - do not check if billed separately Has the patient been seen at the hospital within  the last three years: Yes Total Score: 110 Level Of Care: New/Established - Level 3 [name], [name] ([id number]) Electronic Signature(s) Signed: 04/16/2021 4:33:32 PM By: Donnamarie Poag Entered By: Donnamarie Poag on 04/16/2021 11:38:53 [name], [name] ([id number]) -------------------------------------------------------------------------------- Compression Therapy Details Patient Name: [name]. Date of Service: 04/16/2021 10:00 AM Medical Record Number: [id number] Patient Account Number: [id number] Date of Birth/Sex: 07-Jun-1960 (61 y.o. M) Treating RN: Donnamarie Poag Primary Care Tanna Loeffler: [name] Other Clinician: Referring Kevontae Burgoon: Merlyn Lot Treating Keyshla Tunison/Extender: Skipper Cliche in Treatment: 0 Compression Therapy Performed for Wound Assessment: Wound #3 Left,Anterior Lower Leg Performed By: Clinician Donnamarie Poag, RN Compression Type: Three Layer Post Procedure Diagnosis Same as Pre-procedure Electronic Signature(s) Signed: 04/16/2021 4:33:32 PM By: Donnamarie Poag Entered By: Donnamarie Poag on 04/16/2021 11:39:27 [name], [name] ([id number]) -------------------------------------------------------------------------------- Compression Therapy Details Patient Name: [name]. Date of Service: 04/16/2021 10:00 AM Medical Record Number: [id number] Patient Account Number: [id number] Date of Birth/Sex: February 21, 1961 (61 y.o. M) Treating RN: Donnamarie Poag Primary Care Auden Tatar: [name] Other Clinician: Referring Gabino Hagin: Merlyn Lot Treating Kaileb Monsanto/Extender: Skipper Cliche in Treatment: 0 Compression Therapy Performed for Wound Assessment:  Wound #4 Right,Medial,Anterior Lower Leg Performed By: Clinician Donnamarie Poag, RN Compression Type: Three Layer Post Procedure Diagnosis Same as Pre-procedure Electronic Signature(s) Signed: 04/16/2021 4:33:32 PM By: Donnamarie Poag Entered By: Donnamarie Poag on 04/16/2021 11:39:28 [name], [name] ([id number]) -------------------------------------------------------------------------------- Encounter Discharge Information Details Patient Name: [name], [name]. Date of Service: 04/16/2021 10:00 AM Medical Record Number: [id number] Patient Account Number: [id number] Date of Birth/Sex: 1960/08/24 (61 y.o. M) Treating RN: Donnamarie Poag Primary Care Kimberlin Scheel: [name] Other Clinician: Referring Britta Louth: Merlyn Lot Treating Danae Oland/Extender: Skipper Cliche in Treatment: 0 Encounter Discharge Information Items Post Procedure Vitals Discharge Condition: Unstable Temperature (F): 97.7 Ambulatory Status: Ambulatory Pulse (bpm): 58 Discharge Destination: Home Respiratory Rate (breaths/min): 16 Transportation: Private Auto Blood Pressure (mmHg): 150/72 Accompanied By: self Schedule Follow-up Appointment: Yes Clinical Summary of Care: Electronic Signature(s) Signed: 04/16/2021 4:33:32 PM By: Donnamarie Poag Entered By: Donnamarie Poag on 04/16/2021 11:41:06 Rabalais, [name] ([id number]) -------------------------------------------------------------------------------- Lower Extremity Assessment Details Patient Name: Carl Speller F. Date of Service: 04/16/2021 10:00 AM Medical Record Number: [id number] Patient Account Number: [id number] Date of Birth/Sex: 08/13/1960 (61 y.o. M) Treating RN: Donnamarie Poag Primary Care Bryan Goin: [name] Other Clinician: Referring Jaidin Ugarte: Merlyn Lot Treating Auden Tatar/Extender: Skipper Cliche in Treatment: 0 Edema Assessment Assessed: [Left: Yes] [Right: Yes] Edema: [Left: No] [Right: No] Calf Left: Right: Point of Measurement: 30 cm From  Medial Instep 39.5 cm 39.5 cm Ankle Left: Right: Point of Measurement: 10 cm From Medial Instep 23.5 cm 24 cm Knee To Floor Left: Right: From Medial Instep 40 cm 40 cm Vascular Assessment Pulses: Dorsalis Pedis Palpable: [Left:Yes] [Right:Yes] Blood Pressure: Brachial: [Right:124] Ankle: [Right:Dorsalis Pedis: 180 1.45] Electronic Signature(s) Signed: 04/16/2021 4:33:32 PM By: Donnamarie Poag Entered By: Donnamarie Poag on 04/16/2021 10:50:46 Hetland, [name] ([id number]) -------------------------------------------------------------------------------- Multi Wound Chart Details Patient Name: Carl Speller F. Date of Service: 04/16/2021 10:00 AM Medical Record Number: [id number] Patient Account Number: [id number] Date of Birth/Sex: 1960-09-06 (61 y.o. M) Treating RN: Donnamarie Poag Primary Care Clotilda Hafer: [name] Other Clinician: Referring Brizeida Mcmurry: Merlyn Lot Treating Sascha Baugher/Extender: Skipper Cliche in Treatment: 0 Vital Signs Height(in): 70 Pulse(bpm): 64 Weight(lbs): 270 Blood Pressure(mmHg): 150/72 Body Mass Index(BMI): 38.7 Temperature(F): 97.7 Respiratory Rate(breaths/min): 16 Photos: Wound Location: Left, Anterior Lower Leg Right Lower Leg Right, Lateral, Anterior Lower Leg Wounding Event: Blister Blister Blister Primary Etiology:  Venous Leg Ulcer Venous Leg Ulcer Venous Leg Ulcer Comorbid History: Cataracts, Lymphedema, Asthma, Cataracts, Lymphedema, Asthma, Cataracts, Lymphedema, Asthma, Sleep Apnea, Arrhythmia, Sleep Apnea, Arrhythmia, Sleep Apnea, Arrhythmia, Congestive Heart Failure, Coronary Congestive Heart Failure, Coronary Congestive Heart Failure, Coronary Artery Disease, Hypertension, Type Artery Disease, Hypertension, Type Artery Disease, Hypertension, Type II Diabetes, End Stage Renal II Diabetes, End Stage Renal II Diabetes, End Stage Renal Disease, Gout, Osteoarthritis, Disease, Gout, Osteoarthritis, Disease, Gout, Osteoarthritis, Neuropathy  Neuropathy Neuropathy Date Acquired: 03/08/2021 03/08/2021 03/08/2021 Weeks of Treatment: 0 0 0 Wound Status: Open Open Open Wound Recurrence: No No No Measurements L x W x D (cm) 3.8x1.6x0.1 1.9x1.5x0.1 0.8x0.6x0.1 Area (cm) : 4.775 2.238 0.377 Volume (cm) : 0.478 0.224 0.038 % Reduction in Area: N/A N/A 0.00% % Reduction in Volume: N/A N/A 0.00% Classification: Full Thickness Without Exposed Full Thickness Without Exposed Full Thickness Without Exposed Support Structures Support Structures Support Structures Exudate Amount: Medium Medium Medium Exudate Type: Serosanguineous Serosanguineous Serosanguineous Exudate Color: red, brown red, brown red, brown Granulation Amount: Small (1-33%) None Present (0%) None Present (0%) Granulation Quality: Pink N/A N/A Necrotic Amount: Large (67-100%) Large (67-100%) Large (67-100%) Necrotic Tissue: Eschar Eschar Eschar, Adherent Slough Exposed Structures: Fat Layer (Subcutaneous Tissue): Fat Layer (Subcutaneous Tissue): Fat Layer (Subcutaneous Tissue): Yes Yes Yes Fascia: No Fascia: No Fascia: No Tendon: No Tendon: No Tendon: No Muscle: No Muscle: No Muscle: No Joint: No Joint: No Joint: No Bone: No Bone: No Bone: No Treatment Notes Electronic Signature(s) Signed: 04/16/2021 4:33:32 PM By: Hassan Rowan ([id number]) Entered By: Donnamarie Poag on 04/16/2021 11:06:24 [name] ([id number]) -------------------------------------------------------------------------------- [place] Details Patient Name: [name], [name]. Date of Service: 04/16/2021 10:00 AM Medical Record Number: [id number] Patient Account Number: [id number] Date of Birth/Sex: [birth year] (62 y.o. M) Treating RN: Donnamarie Poag Primary Care Trinidee Schrag: [name] Other Clinician: Referring Honor Fairbank: Merlyn Lot Treating [name]/Extender: Skipper Cliche in Treatment: 0 Active Inactive Orientation to the Wound Care  Program Nursing Diagnoses: Knowledge deficit related to the wound healing center program Goals: Patient/caregiver will verbalize understanding of the [place] Program Date Initiated: 04/16/2021 Target Resolution Date: 04/27/2021 Goal Status: Active Interventions: Provide education on orientation to the wound center Notes: Wound/Skin Impairment Nursing Diagnoses: Impaired tissue integrity Knowledge deficit related to smoking impact on wound healing Knowledge deficit related to ulceration/compromised skin integrity Goals: Patient/caregiver will verbalize understanding of skin care regimen Date Initiated: 04/16/2021 Target Resolution Date: 04/27/2021 Goal Status: Active Ulcer/skin breakdown will have a volume reduction of 30% by week 4 Date Initiated: 04/16/2021 Target Resolution Date: 05/14/2021 Goal Status: Active Ulcer/skin breakdown will have a volume reduction of 50% by week 8 Date Initiated: 04/16/2021 Target Resolution Date: 06/11/2021 Goal Status: Active Ulcer/skin breakdown will have a volume reduction of 80% by week 12 Date Initiated: 04/16/2021 Target Resolution Date: 07/09/2021 Goal Status: Active Ulcer/skin breakdown will heal within 14 weeks Date Initiated: 04/16/2021 Target Resolution Date: 07/23/2021 Goal Status: Active Interventions: Assess patient/caregiver ability to obtain necessary supplies Assess patient/caregiver ability to perform ulcer/skin care regimen upon admission and as needed Assess ulceration(s) every visit Notes: Electronic Signature(s) Signed: 04/16/2021 4:33:32 PM By: Donnamarie Poag Entered By: Donnamarie Poag on 04/16/2021 11:04:55 [name], [name] ([id number]) -------------------------------------------------------------------------------- Pain Assessment Details Patient Name: [name]. Date of Service: 04/16/2021 10:00 AM Medical Record Number: [id number] Patient Account Number: [id number] Date of Birth/Sex: June 16, 1960 (61 y.o.  M) Treating RN: Donnamarie Poag Primary Care Sahira Cataldi: [name] Other Clinician: Referring Finnian Husted: Merlyn Lot  Treating Rakeya Glab/Extender: Jeri Cos Weeks in Treatment: 0 Active Problems Location of Pain Severity and Description of Pain Patient Has Paino Yes Site Locations Pain Location: Pain in Ulcers Rate the pain. Current Pain Level: 3 Pain Management and Medication Current Pain Management: Electronic Signature(s) Signed: 04/16/2021 4:33:32 PM By: Donnamarie Poag Entered By: Donnamarie Poag on 04/16/2021 10:32:37 [name], [name] ([id number]) -------------------------------------------------------------------------------- Patient/Caregiver Education Details Patient Name: [name]. Date of Service: 04/16/2021 10:00 AM Medical Record Number: [id number] Patient Account Number: [id number] Date of Birth/Gender: 05-20-1960 (61 y.o. M) Treating RN: Donnamarie Poag Primary Care Physician: [name] Other Clinician: Referring Physician: Merlyn Lot Treating Physician/Extender: Skipper Cliche in Treatment: 0 Education Assessment Education Provided To: Patient Education Topics Provided Basic Hygiene: Elevated Blood Sugar/ Impact on Healing: Venous: Welcome [facility]: Wound Debridement: Wound/Skin Impairment: Electronic Signature(s) Signed: 04/16/2021 4:33:32 PM By: Donnamarie Poag Entered By: Donnamarie Poag on 04/16/2021 11:28:17 [name], [name] ([id number]) -------------------------------------------------------------------------------- Wound Assessment Details Patient Name: Carl Speller F. Date of Service: 04/16/2021 10:00 AM Medical Record Number: [id number] Patient Account Number: [id number] Date of Birth/Sex: December 29, 1960 (61 y.o. M) Treating RN: Donnamarie Poag Primary Care Jamarrius Salay: [name] Other Clinician: Referring Evans Levee: Merlyn Lot Treating Nazia Rhines/Extender: Skipper Cliche in Treatment: 0 Wound Status Wound Number: 3  Primary Venous Leg Ulcer Etiology: Wound Location: Left, Anterior Lower Leg Wound Open Wounding Event: Blister Status: Date Acquired: 03/08/2021 Comorbid Cataracts, Lymphedema, Asthma, Sleep Apnea, Weeks Of Treatment: 0 History: Arrhythmia, Congestive Heart Failure, Coronary Artery Clustered Wound: No Disease, Hypertension, Type II Diabetes, End Stage Renal Disease, Gout, Osteoarthritis, Neuropathy Photos Wound Measurements Length: (cm) 3.8 Width: (cm) 1.6 Depth: (cm) 0.1 Area: (cm) 4.775 Volume: (cm) 0.478 % Reduction in Area: % Reduction in Volume: Tunneling: No Undermining: No Wound Description Classification: Full Thickness Without Exposed Support Structu Exudate Amount: Medium Exudate Type: Serosanguineous Exudate Color: red, brown res Foul Odor After Cleansing: No Slough/Fibrino Yes Wound Bed Granulation Amount: Small (1-33%) Exposed Structure Granulation Quality: Pink Fascia Exposed: No Necrotic Amount: Large (67-100%) Fat Layer (Subcutaneous Tissue) Exposed: Yes Necrotic Quality: Eschar Tendon Exposed: No Muscle Exposed: No Joint Exposed: No Bone Exposed: No Treatment Notes Wound #3 (Lower Leg) Wound Laterality: Left, Anterior Cleanser Soap and Water Discharge Instruction: Gently cleanse wound with antibacterial soap, rinse and pat dry prior to dressing wounds Wound Cleanser BRANDIS, MATSUURA ([id number]) Discharge Instruction: Wash your hands with soap and water. Remove old dressing, discard into plastic bag and place into trash. Cleanse the wound with Wound Cleanser prior to applying a clean dressing using gauze sponges, not tissues or cotton balls. Do not scrub or use excessive force. Pat dry using gauze sponges, not tissue or cotton balls. Peri-Wound Care Topical Primary Dressing Silvercel 4 1/4x 4 1/4 (in/in) Discharge Instruction: Apply Silvercel 4 1/4x 4 1/4 (in/in) as instructed Secondary Dressing ABD Pad 5x9 (in/in) Discharge Instruction:  Cover with ABD pad Secured With Compression Wrap Profore Lite LF 3 Multilayer Compression [place] Discharge Instruction: Apply 3 multi-layer wrap as prescribed. Compression Stockings Add-Ons Electronic Signature(s) Signed: 04/16/2021 4:33:32 PM By: Donnamarie Poag Entered By: Donnamarie Poag on 04/16/2021 10:57:28 [place], [name] ([id number]) -------------------------------------------------------------------------------- Wound Assessment Details Patient Name: Carl Speller F. Date of Service: 04/16/2021 10:00 AM Medical Record Number: [id number] Patient Account Number: [id number] Date of Birth/Sex: Sep 13, 1960 (61 y.o. M) Treating RN: Donnamarie Poag Primary Care Deandrew Hoecker: [name] Other Clinician: Referring Angelino Rumery: Merlyn Lot Treating Ivo Moga/Extender: Skipper Cliche in Treatment: 0 Wound Status Wound Number: 4  Primary Venous Leg Ulcer Etiology: Wound Location: Right Lower Leg Wound Open Wounding Event: Blister Status: Date Acquired: 03/08/2021 Comorbid Cataracts, Lymphedema, Asthma, Sleep Apnea, Weeks Of Treatment: 0 History: Arrhythmia, Congestive Heart Failure, Coronary Artery Clustered Wound: No Disease, Hypertension, Type II Diabetes, End Stage Renal Disease, Gout, Osteoarthritis, Neuropathy Photos Wound Measurements Length: (cm) 1.9 Width: (cm) 1.5 Depth: (cm) 0.1 Area: (cm) 2.238 Volume: (cm) 0.224 % Reduction in Area: % Reduction in Volume: Tunneling: No Undermining: No Wound Description Classification: Full Thickness Without Exposed Support Structu Exudate Amount: Medium Exudate Type: Serosanguineous Exudate Color: red, brown res Foul Odor After Cleansing: No Slough/Fibrino Yes Wound Bed Granulation Amount: None Present (0%) Exposed Structure Necrotic Amount: Large (67-100%) Fascia Exposed: No Necrotic Quality: Eschar Fat Layer (Subcutaneous Tissue) Exposed: Yes Tendon Exposed: No Muscle Exposed: No Joint Exposed: No Bone  Exposed: No Electronic Signature(s) Signed: 04/16/2021 4:33:32 PM By: Donnamarie Poag Entered By: Donnamarie Poag on 04/16/2021 11:00:04 [place]ning, [name] ([id number]) -------------------------------------------------------------------------------- Wound Assessment Details Patient Name: Carl Speller F. Date of Service: 04/16/2021 10:00 AM Medical Record Number: [id number] Patient Account Number: [id number] Date of Birth/Sex: [birth year] (61 y.o. M) Treating RN: Donnamarie Poag Primary Care Vianna Venezia: [name] Other Clinician: Referring Benedicta Sultan: Merlyn Lot Treating Ozzie Remmers/Extender: Skipper Cliche in Treatment: 0 Wound Status Wound Number: 5 Primary Venous Leg Ulcer Etiology: Wound Location: Right, Lateral, Anterior Lower Leg Wound Open Wounding Event: Blister Status: Date Acquired: 03/08/2021 Comorbid Cataracts, Lymphedema, Asthma, Sleep Apnea, Weeks Of Treatment: 0 History: Arrhythmia, Congestive Heart Failure, Coronary Artery Clustered Wound: No Disease, Hypertension, Type II Diabetes, End Stage Renal Disease, Gout, Osteoarthritis, Neuropathy Photos Wound Measurements Length: (cm) 0.8 Width: (cm) 0.6 Depth: (cm) 0.1 Area: (cm) 0.377 Volume: (cm) 0.038 % Reduction in Area: 0% % Reduction in Volume: 0% Tunneling: No Undermining: No Wound Description Classification: Full Thickness Without Exposed Support Structu Exudate Amount: Medium Exudate Type: Serosanguineous Exudate Color: red, brown res Foul Odor After Cleansing: No Slough/Fibrino Yes Wound Bed Granulation Amount: None Present (0%) Exposed Structure Necrotic Amount: Large (67-100%) Fascia Exposed: No Necrotic Quality: Eschar, Adherent Slough Fat Layer (Subcutaneous Tissue) Exposed: Yes Tendon Exposed: No Muscle Exposed: No Joint Exposed: No Bone Exposed: No Treatment Notes Wound #5 (Lower Leg) Wound Laterality: Right, Lateral, Anterior Cleanser Soap and Water Discharge Instruction: Gently cleanse  wound with antibacterial soap, rinse and pat dry prior to dressing wounds Wound Cleanser TRESHUN, WOLD. ([id number]) Discharge Instruction: Wash your hands with soap and water. Remove old dressing, discard into plastic bag and place into trash. Cleanse the wound with Wound Cleanser prior to applying a clean dressing using gauze sponges, not tissues or cotton balls. Do not scrub or use excessive force. Pat dry using gauze sponges, not tissue or cotton balls. Peri-Wound Care Topical Primary Dressing Silvercel 4 1/4x 4 1/4 (in/in) Discharge Instruction: Apply Silvercel 4 1/4x 4 1/4 (in/in) as instructed Secondary Dressing ABD Pad 5x9 (in/in) Discharge Instruction: Cover with ABD pad Secured With Compression Wrap Profore Lite LF 3 Multilayer Compression [place] Discharge Instruction: Apply 3 multi-layer wrap as prescribed. Compression Stockings Add-Ons Electronic Signature(s) Signed: 04/16/2021 4:33:32 PM By: Donnamarie Poag Entered By: Donnamarie Poag on 04/16/2021 11:02:18 [name] ([id number]) -------------------------------------------------------------------------------- [place] Details Patient Name: [name]. Date of Service: 04/16/2021 10:00 AM Medical Record Number: [id number] Patient Account Number: [id number] Date of Birth/Sex: September 17, 1960 (61 y.o. M) Treating RN: Donnamarie Poag Primary Care Dezra Mandella: [name] Other Clinician: Referring Hernandez [place]: Merlyn Lot Treating Jannah Guardiola/Extender: Skipper Cliche in Treatment: 0 Vital  Signs Time Taken: 10:30 Temperature (F): 97.7 Height (in): 70 Pulse (bpm): 58 Source: Stated Respiratory Rate (breaths/min): 16 Weight (lbs): 270 Blood Pressure (mmHg): 150/72 Source: Measured Reference Range: 80 - 120 mg / dl Body Mass Index (BMI): 38.7 Electronic Signature(s) Signed: 04/16/2021 4:33:32 PM By: Donnamarie Poag Entered ByDonnamarie Poag on 04/16/2021 10:33:05

## 2021-04-18 ENCOUNTER — Other Ambulatory Visit (INDEPENDENT_AMBULATORY_CARE_PROVIDER_SITE_OTHER): Payer: Self-pay | Admitting: Physician Assistant

## 2021-04-18 DIAGNOSIS — L98499 Non-pressure chronic ulcer of skin of other sites with unspecified severity: Secondary | ICD-10-CM

## 2021-04-18 DIAGNOSIS — I890 Lymphedema, not elsewhere classified: Secondary | ICD-10-CM

## 2021-04-18 DIAGNOSIS — L97822 Non-pressure chronic ulcer of other part of left lower leg with fat layer exposed: Secondary | ICD-10-CM

## 2021-04-18 DIAGNOSIS — L97812 Non-pressure chronic ulcer of other part of right lower leg with fat layer exposed: Secondary | ICD-10-CM

## 2021-04-19 ENCOUNTER — Other Ambulatory Visit: Payer: Self-pay

## 2021-04-19 ENCOUNTER — Ambulatory Visit (INDEPENDENT_AMBULATORY_CARE_PROVIDER_SITE_OTHER): Payer: 59 | Admitting: Cardiovascular Disease

## 2021-04-19 ENCOUNTER — Encounter: Payer: Self-pay | Admitting: Cardiovascular Disease

## 2021-04-19 VITALS — BP 138/78 | HR 57 | Ht 70.5 in | Wt 276.2 lb

## 2021-04-19 DIAGNOSIS — I132 Hypertensive heart and chronic kidney disease with heart failure and with stage 5 chronic kidney disease, or end stage renal disease: Secondary | ICD-10-CM | POA: Diagnosis not present

## 2021-04-19 DIAGNOSIS — I5042 Chronic combined systolic (congestive) and diastolic (congestive) heart failure: Secondary | ICD-10-CM | POA: Diagnosis not present

## 2021-04-19 DIAGNOSIS — E1140 Type 2 diabetes mellitus with diabetic neuropathy, unspecified: Secondary | ICD-10-CM | POA: Diagnosis not present

## 2021-04-19 DIAGNOSIS — L97822 Non-pressure chronic ulcer of other part of left lower leg with fat layer exposed: Secondary | ICD-10-CM | POA: Diagnosis not present

## 2021-04-19 DIAGNOSIS — I10 Essential (primary) hypertension: Secondary | ICD-10-CM

## 2021-04-19 DIAGNOSIS — I2510 Atherosclerotic heart disease of native coronary artery without angina pectoris: Secondary | ICD-10-CM

## 2021-04-19 DIAGNOSIS — Z940 Kidney transplant status: Secondary | ICD-10-CM

## 2021-04-19 DIAGNOSIS — I87311 Chronic venous hypertension (idiopathic) with ulcer of right lower extremity: Secondary | ICD-10-CM | POA: Diagnosis not present

## 2021-04-19 DIAGNOSIS — I482 Chronic atrial fibrillation, unspecified: Secondary | ICD-10-CM

## 2021-04-19 DIAGNOSIS — N186 End stage renal disease: Secondary | ICD-10-CM | POA: Diagnosis not present

## 2021-04-19 DIAGNOSIS — E1151 Type 2 diabetes mellitus with diabetic peripheral angiopathy without gangrene: Secondary | ICD-10-CM | POA: Diagnosis not present

## 2021-04-19 DIAGNOSIS — L97812 Non-pressure chronic ulcer of other part of right lower leg with fat layer exposed: Secondary | ICD-10-CM | POA: Diagnosis not present

## 2021-04-19 DIAGNOSIS — E1122 Type 2 diabetes mellitus with diabetic chronic kidney disease: Secondary | ICD-10-CM | POA: Diagnosis not present

## 2021-04-19 DIAGNOSIS — E785 Hyperlipidemia, unspecified: Secondary | ICD-10-CM

## 2021-04-19 DIAGNOSIS — E11622 Type 2 diabetes mellitus with other skin ulcer: Secondary | ICD-10-CM | POA: Diagnosis not present

## 2021-04-19 MED ORDER — LOSARTAN POTASSIUM 25 MG PO TABS
25.0000 mg | ORAL_TABLET | Freq: Every day | ORAL | 3 refills | Status: DC
  Filled 2021-04-19 – 2021-05-28 (×2): qty 90, 90d supply, fill #0

## 2021-04-19 NOTE — Progress Notes (Signed)
Cardiology Office Note   Date:  04/19/2021   ID:  [name]., DOB 01/04/1961, MRN [id number]  PCP:  [name], MD  Cardiologist:   [name], MD   Chief Complaint  Patient presents with   Other    1 month f/u no complaints today. Meds reviewed verbally with pt.      History of Present Illness: [name]. is a 61 y.o. male   who is here today regarding complex cardiac issues.  He has history of CAD status post CABG in 2005, chronic combined systolic and diastolic CHF secondary to ICM with prior EF of 35 to 40% now improved to 50 to 55% by echo in 03/2018, chronic A. fib on Eliquis, pulmonary hypertension, ESRD previously on hemodialysis from 2004 through 2012 status post kidney transplant in 2012, anemia of chronic disease, DM2, HTN, HLD, beta-blocker induced bradycardia, obesity status post gastric bypass surgery in 11/2013, and sleep apnea on BiPAP.   Stress test on 04/10/2018 showed no significant ischemia, small region of fixed perfusion defect in the inferoapical and apical septal region possibly attenuation artifact, EF 47%, low risk study.    He had episodes of intermittent episodes of extreme dizziness with heart rate in the high 40s.  No syncope.  He had a ZIO Patch monitor done which showed no significant bradycardic events except during sleep time when his heart rate was in 30s.  Overall, there was no indication for a pacemaker.  He is followed by the advanced heart failure clinic.  He has known history of severe pulmonary hypertension and some right-sided failure.  He was hospitalized in early April with COVID-19 pneumonia.  He had left cephalic vein thrombosis on Doppler.  Most recent ejection fraction was 45 to 50% by echo in February 2021.  He was most recently seen by Ignacia Bayley in December for atypical chest pain.  Small dose losartan was added.  He underwent a Lexiscan Myoview which overall was a low risk study with evidence of  prior infarct in the infero-apical area with no significant change from before.  He reports no recurrent chest pain and he has been doing reasonably well overall.  Dyspnea is stable.  His weight is up on our scale but he is wearing extra clothing.  He reports that his weight has been stable on his home scale.  He developed a blister on the left lower extremity and is currently being treated at the wound clinic.  It appears to be a venous stasis ulcer.  Past Medical History:  Diagnosis Date   Allergy    seasonal   Amnestic MCI (mild cognitive impairment with memory loss)    Asthma    Benign neoplasm of colon    CAD (coronary artery disease) 2005   a.  Status post four-vessel CABG in 2005; b. 03/2018 MV: Small, fixed inferoapical and apical septal defect. No ischemia. EF 47% (60-65% by 05/2018 Echo).   Chronic atrial fibrillation ([place])    a. CHADS2VASc 4 (CHF, HTN, DM, vascular disease)--> Eliquis; b. 09/2018 Zio: Chronic Afib.   Chronic combined systolic and diastolic CHF (congestive heart failure) ([place])    a.  7/18 Echo: EF 45-50%; b. 05/2018 Echo: EF 60-65%, RVSP 74.8; c. 12/2018 Echo: EF 50-55%. Sev dil LA; d. 04/2019 Echo: EF 45-50%, glob HK. Mod reduced RV fxn. RVSP 53.38mHg.   Chronic venous stasis dermatitis of both lower extremities    Cytomegaloviral disease ([place] 2017   Depression  Diabetes mellitus    Diverticulosis    Dyspnea    Erectile dysfunction    ESRD (end stage renal disease) ([place]) 2005   a. HD 2004-2012; b. S/p renal transplant in 2012   GERD (gastroesophageal reflux disease)    Gout    Hyperlipidemia    low since renal failure   Hypertension    Kidney transplant status, cadaveric 2012   Obesity    PAH (pulmonary artery hypertension) ([place])    a. 05/2018 Echo: RVSP 74.59mHg; b. 12/2018 RV not well visualized; c. 04/2019 RHC: RA 19, RV 80/20, PA 78/31 (51), PCWP 25, CO/CI 7.69/3.26. PVR 3.25 WU-->Sev PAH, likely primarily PV HTN.   Pneumonia    Purpura ([place]    Sleep  apnea    bipap with oxygen 2 l   Trigger finger    Ulcer 05/2016   Left shin   Wears glasses     Past Surgical History:  Procedure Laterality Date   BARIATRIC SURGERY     CARD[facility]  CARDIAC CATHETERIZATION  05/03/2019   CATARACT EXTRACTION W/PHACO Right 10/29/2017   Procedure: CATARACT EXTRACTION PHACO AND INTRAOCULAR LENS PLACEMENT ([place]  RIGHT DIABETIC;  Surgeon: BLeandrew Koyanagi MD;  [facility]  Service: Ophthalmology;  Laterality: Right;  Diabetic - insulin   CATARACT EXTRACTION W/PHACO Left 11/18/2017   Procedure: CATARACT EXTRACTION PHACO AND INTRAOCULAR LENS PLACEMENT ([place] LEFT IVA/TOPICAL;  Surgeon: BLeandrew Koyanagi MD;  [facility]  Service: Ophthalmology;  Laterality: Left;  DIABETES - insulin sleep apnea   COLONOSCOPY WITH PROPOFOL N/A 04/22/2013   Procedure: COLONOSCOPY WITH PROPOFOL;  Surgeon: DMilus Banister MD;  [facility];  Service: Endoscopy;  Laterality: N/A;   CORONARY ARTERY BYPASS GRAFT  2005   X 4   DG ANGIO AV SHUNT*L*     right and left upper arms   FASCIOTOMY  03/03/2012   Procedure: FASCIOTOMY;  Surgeon: GWynonia Sours MD;  [facility]  Service: Orthopedics;  Laterality: Right;  FASCIOTOMY RIGHT SMALL FINGER   FASCIOTOMY Left 08/17/2013   Procedure: FASCIOTOMY LEFT RING;  Surgeon: GWynonia Sours MD;  [facility]  Service: Orthopedics;  Laterality: Left;   INCISION AND DRAINAGE ABSCESS Left 10/15/2015   Procedure: INCISION AND DRAINAGE ABSCESS;  Surgeon: DJules Husbands MD;  Location: [facility];  Service: General;  Laterality: Left;   KIDNEY TRANSPL[facility]  09/13/2010   [facility] 11/15/2016   Procedure: RIGHT HEART CATH;  Surgeon: AWellington Hampshire MD;  [facility];  Service: Cardiovascular;  Laterality: N/A;   RIGHT HEART CATH N/A 05/03/2019   Procedure: RIGHT HEART CATH;  Surgeon:  MLarey Dresser MD;  [facility];  Service: Cardiovascular;  Laterality: N/A;   TYMPANIC MEMBRANE REPAIR  1/12   left   VASECTOMY       Current Outpatient Medications  Medication Sig Dispense Refill   albuterol (PROVENTIL) (2.5 MG/3ML) 0.083% nebulizer solution Take 3 mLs (2.5 mg total) by nebulization every 6 (six) hours as needed for wheezing or shortness of breath. 150 mL 0   albuterol (VENTOLIN HFA) 108 (90 Base) MCG/ACT inhaler Inhale 2 puffs into the lungs every 6 (six) hours as needed for wheezing or shortness of breath. 18 g 5   apixaban (ELIQUIS) 5 MG TABS tablet TAKE 1 TABLET BY MOUTH TWICE DAILY 180 tablet 1  beclomethasone (QVAR) 80 MCG/ACT inhaler Inhale 2 puffs into the lungs 2 (two) times daily. 10.6 g 5   buPROPion (WELLBUTRIN SR) 150 MG 12 hr tablet TAKE 1 TABLET BY MOUTH TWICE DAILY 180 tablet 3   cholecalciferol (VITAMIN D3) 25 MCG (1000 UNIT) tablet Take 1,000 Units by mouth daily.     colchicine 0.6 MG tablet Take 1 tablet (0.6 mg total) by mouth every other day 30 tablet 1   Cyanocobalamin (B-12 PO) Take by mouth.     dapagliflozin propanediol (FARXIGA) 10 MG TABS tablet TAKE 1 TABLET (10 MG TOTAL) BY MOUTH DAILY BEFORE BREAKFAST. 30 tablet 6   Febuxostat 80 MG TABS Take 1 tablet (80 mg total) by mouth once daily for 180 days 90 tablet 1   fluticasone (FLONASE) 50 MCG/ACT nasal spray PLACE 2 SPRAYS INTO BOTH NOSTRILS DAILY. 16 g 11   gabapentin (NEURONTIN) 300 MG capsule TAKE 1 CAPSULE BY MOUTH TWICE DAILY 180 capsule 1   HUMALOG KWIKPEN 100 UNIT/ML KwikPen Inject 5-10 Units into the skin 3 (three) times daily. Sliding scale     insulin glargine-yfgn (SEMGLEE) 100 UNIT/ML Pen Inject 40 Units subcutaneously once daily 15 mL 5   iron polysaccharides (FERREX 150) 150 MG capsule TAKE 1 CAPSULE BY MOUTH 2 TIMES DAILY. 180 capsule 1   losartan (COZAAR) 25 MG tablet Take 0.5 tablets (12.5 mg total) by mouth daily. 45 tablet 3   metolazone (ZAROXOLYN) 2.5 MG  tablet Take 1 tablet by mouth 2 days per week 10 tablet 0   montelukast (SINGULAIR) 10 MG tablet TAKE 1 TABLET BY MOUTH AT BEDTIME. 90 tablet 3   Multiple Vitamin (MULTIVITAMIN WITH MINERALS) TABS tablet Take 1 tablet by mouth daily.     mycophenolate (MYFORTIC) 180 MG EC tablet Take 360 mg by mouth 2 (two) times daily.     omeprazole (PRILOSEC) 20 MG capsule TAKE 1 CAPSULE BY MOUTH DAILY. 90 capsule 3   Potassium Chloride CR (MICRO-K) 8 MEQ CPCR capsule CR Take 9 capsules (72 mEq total) by mouth 2 (two) times daily. 450 capsule 3   predniSONE (DELTASONE) 5 MG tablet Take 1 tablet (5 mg total) by mouth daily. 90 tablet 3   rosuvastatin (CRESTOR) 10 MG tablet TAKE 1 TABLET BY MOUTH DAILY 90 tablet 2   spironolactone (ALDACTONE) 25 MG tablet Take 1/2 tablet daily     sulfamethoxazole-trimethoprim (BACTRIM) 400-80 MG tablet TAKE 1 TABLET BY MOUTH EVERY MONDAY, WEDNESDAY, FRIDAY. 36 tablet 3   tacrolimus (PROGRAF) 1 MG capsule Take 1 capsule (1 mg total) by mouth 2 times daily. 60 capsule 5   tadalafil, PAH, (ADCIRCA) 20 MG tablet TAKE 1 TABLET (20 MG TOTAL) BY MOUTH EVERY OTHER DAY. 60 tablet 1   tamsulosin (FLOMAX) 0.4 MG CAPS capsule TAKE 1 CAPSULE BY MOUTH DAILY. 90 capsule 3   torsemide (DEMADEX) 100 MG tablet Take 1 tablet (100 mg total) by mouth 2 (two) times daily. 60 tablet 4   vitamin C (ASCORBIC ACID) 250 MG tablet Take 1 tablet (250 mg total) by mouth daily. 30 tablet 0   zinc sulfate 220 (50 Zn) MG capsule Take 220 mg by mouth daily.     No current facility-administered medications for this visit.    Allergies:   Contrast media [iodinated contrast media] and Ibuprofen    Social History:  The patient  reports that he quit smoking about 26 years ago. His smoking use included cigars. He has never used smokeless tobacco. He reports current alcohol  use. He reports that he does not use drugs.   Family History:  The patient's family history includes Breast cancer in his maternal  grandmother; Diabetes in his maternal grandmother; Heart disease in his father; Kidney disease in his father; Kidney failure in his father; Liver cancer in his paternal grandmother and paternal uncle; Valvular heart disease in his mother.    ROS:  Please see the history of present illness.   Otherwise, review of systems are positive for none.   All other systems are reviewed and negative.    PHYSICAL EXAM: VS:  BP 138/78 (BP Location: Left Arm, Patient Position: Sitting, Cuff Size: Large)    Pulse (!) 57    Ht 5' 10.5" (1.791 m)    Wt 276 lb 4 oz (125.3 kg)    SpO2 98%    BMI 39.08 kg/m  , BMI Body mass index is 39.08 kg/m. GEN: Well nourished, well developed, in no acute distress  HEENT: normal  Neck: No JVD,  no carotid bruits, or masses Cardiac: Irregularly irregular; no  rubs, or gallops, trace leg  edema . 2/6 systolic ejection murmur in the aortic area.   Respiratory:  clear to auscultation bilaterally, normal work of breathing GI: soft, nontender, , + BS.  Nondistended. MS: no deformity or atrophy  Skin: warm and dry, no rash.  Chronic stasis dermatitis in the left lower extremity. Neuro:  Strength and sensation are intact Psych: euthymic mood, full affect   EKG:  EKG is ordered today. The ekg ordered today demonstrates atrial fibrillation with slow ventricular rate.  Nonspecific IVCD.   Recent Labs: 07/24/2020: Magnesium 1.9 10/30/2020: B Natriuretic Peptide 171.2 04/04/2021: ALT 12; BUN 57; Creatinine, Ser 1.74; Hemoglobin 12.1; Platelets 138; Potassium 3.2; Sodium 135    Lipid Panel    Component Value Date/Time   CHOL 89 10/09/2017 0000   CHOL 161 03/07/2015 0823   TRIG 73 03/06/2016 0736   HDL 34 (L) 03/06/2016 0736   HDL 38 (L) 03/07/2015 0823   CHOLHDL 2.7 03/06/2016 0736   VLDL 15 03/06/2016 0736   LDLCALC 49 10/09/2017 0000   LDLCALC 105 (H) 03/07/2015 0823   LDLDIRECT 41.9 04/20/2012 1618      Wt Readings from Last 3 Encounters:  04/19/21 276 lb 4 oz (125.3  kg)  04/04/21 259 lb 0.7 oz (117.5 kg)  04/03/21 259 lb (117.5 kg)       No flowsheet data found.    ASSESSMENT AND PLAN:  1.  Chronic systolic/diastolic heart failure: Appears to be euvolemic on current dose of torsemide.  No beta-blockers due to bradycardia.  I elected to increase losartan to 25 mg once daily.  Previous kidney transplant and underlying chronic kidney disease makes management of heart failure more difficult challenging.  Continue Farxiga 10 mg daily.  2. Chronic atrial fibrillation: Rate is controlled without medications. He is tolerating anticoagulation with Eliquis.    3. Coronary artery disease involving native coronary arteries without angina: Continue medical therapy.  I reviewed the results of recent [place] which was overall low risk and unchanged from before.   4. Hyperlipidemia: Continue treatment with rosuvastatin 10 mg daily.  I reviewed most recent lipid profile which showed an LDL of 37.  5.  Status post kidney transplant: Most recent labs showed stable renal function with creatinine of 1.74.  His creatinine has been ranging around 2.  6.  Essential hypertension: Blood pressure improved with addition of small dose losartan and this will be increased  today to 25 mg daily.  7.  Left lower extremity wound: The distribution is consistent with venous ulceration.  The patient is scheduled for an ABI and lower extremity venous duplex in 2 weeks.   Disposition: Follow-up in 6 months.  Signed,  [name], MD  04/19/2021 8:12 AM    [place]

## 2021-04-19 NOTE — Patient Instructions (Signed)
Medication Instructions:  Your physician has recommended you make the following change in your medication:   INCREASE Losartan to 25 mg daily. An Rx has been sent to your pharmacy.  *If you need a refill on your cardiac medications before your next appointment, please call your pharmacy*   Lab Work: None ordered If you have labs (blood work) drawn today and your tests are completely normal, you will receive your results only by: [place] (if you have MyChart) OR A paper copy in the mail If you have any lab test that is abnormal or we need to change your treatment, we will call you to review the results.   Testing/Procedures: None ordered   Follow-Up: At [name], you and your health needs are our priority.  As part of our continuing mission to provide you with exceptional heart care, we have created designated Provider Care Teams.  These Care Teams include your primary Cardiologist (physician) and Advanced Practice Providers (APPs -  Physician Assistants and Nurse Practitioners) who all work together to provide you with the care you need, when you need it.  We recommend signing up for the patient portal called "MyChart".  Sign up information is provided on this After Visit Summary.  MyChart is used to connect with patients for Virtual Visits (Telemedicine).  Patients are able to view lab/test results, encounter notes, upcoming appointments, etc.  Non-urgent messages can be sent to your provider as well.   To learn more about what you can do with MyChart, go to [email or web address].    Your next appointment:   Your physician wants you to follow-up in: 6 months You will receive a reminder letter in the mail two months in advance. If you don't receive a letter, please call our office to schedule the follow-up appointment.   The format for your next appointment:   In Person  Provider:   You may see [name], MD or one of the following Advanced Practice Providers on  your designated Care Team:   Murray Hodgkins, NP Christell Faith, PA-C Cadence Kathlen Mody, PA-C{    Other Instructions N/A

## 2021-04-20 ENCOUNTER — Other Ambulatory Visit: Payer: Self-pay

## 2021-04-24 ENCOUNTER — Other Ambulatory Visit: Payer: Self-pay

## 2021-04-24 ENCOUNTER — Encounter: Payer: 59 | Admitting: Physician Assistant

## 2021-04-24 DIAGNOSIS — N186 End stage renal disease: Secondary | ICD-10-CM | POA: Diagnosis not present

## 2021-04-24 DIAGNOSIS — E11622 Type 2 diabetes mellitus with other skin ulcer: Secondary | ICD-10-CM | POA: Diagnosis not present

## 2021-04-24 DIAGNOSIS — I132 Hypertensive heart and chronic kidney disease with heart failure and with stage 5 chronic kidney disease, or end stage renal disease: Secondary | ICD-10-CM | POA: Diagnosis not present

## 2021-04-24 DIAGNOSIS — I890 Lymphedema, not elsewhere classified: Secondary | ICD-10-CM | POA: Diagnosis not present

## 2021-04-24 DIAGNOSIS — L97812 Non-pressure chronic ulcer of other part of right lower leg with fat layer exposed: Secondary | ICD-10-CM | POA: Diagnosis not present

## 2021-04-24 DIAGNOSIS — E1151 Type 2 diabetes mellitus with diabetic peripheral angiopathy without gangrene: Secondary | ICD-10-CM | POA: Diagnosis not present

## 2021-04-24 DIAGNOSIS — E1140 Type 2 diabetes mellitus with diabetic neuropathy, unspecified: Secondary | ICD-10-CM | POA: Diagnosis not present

## 2021-04-24 DIAGNOSIS — L97822 Non-pressure chronic ulcer of other part of left lower leg with fat layer exposed: Secondary | ICD-10-CM | POA: Diagnosis not present

## 2021-04-24 DIAGNOSIS — E1122 Type 2 diabetes mellitus with diabetic chronic kidney disease: Secondary | ICD-10-CM | POA: Diagnosis not present

## 2021-04-24 DIAGNOSIS — I87311 Chronic venous hypertension (idiopathic) with ulcer of right lower extremity: Secondary | ICD-10-CM | POA: Diagnosis not present

## 2021-04-24 NOTE — Progress Notes (Signed)
[name], [name] ([id number]) Visit Report for 04/24/2021 Arrival Information Details Patient Name: [name], [name]. Date of Service: 04/24/2021 8:30 AM Medical Record Number: [id number] Patient Account Number: [id number] Date of Birth/Sex: 05/20/1960 (61 y.o. M) Treating RN: Donnamarie Poag Primary Care Tamalyn Wadsworth: [name] Other Clinician: Referring Vance Belcourt: [name] Treating Ladashia Demarinis/Extender: Skipper Cliche in Treatment: 1 Visit Information History Since Last Visit Added or deleted any medications: No Patient Arrived: Ambulatory Had a fall or experienced change in No Arrival Time: 08:35 activities of daily living that may affect Accompanied By: self risk of falls: Transfer Assistance: None Hospitalized since last visit: No Patient Identification Verified: Yes Has Dressing in Place as Prescribed: No Secondary Verification Process Completed: Yes Has Compression in Place as Prescribed: No Patient Requires Transmission-Based No Pain Present Now: Yes Precautions: Patient Has Alerts: Yes Patient Alerts: Patient on Blood Thinner DIABETIC [place] left leg 04/16/21 ELIQUIS Electronic Signature(s) Signed: 04/24/2021 3:42:58 PM By: Donnamarie Poag Entered By: Donnamarie Poag on 04/24/2021 08:40:27 Anding, Dorothea Glassman ([id number]) -------------------------------------------------------------------------------- Clinic Level of Care Assessment Details Patient Name: [name]. Date of Service: 04/24/2021 8:30 AM Medical Record Number: [id number] Patient Account Number: [id number] Date of Birth/Sex: [birth year] (61 y.o. M) Treating RN: Donnamarie Poag Primary Care Imanol Bihl: [name] Other Clinician: Referring Tore Carreker: [name] Treating Ravon Mcilhenny/Extender: Skipper Cliche in Treatment: 1 Clinic Level of Care Assessment Items TOOL 4 Quantity Score _0  - Use when only an EandM is performed on FOLLOW-UP visit 0 ASSESSMENTS - Nursing Assessment / Reassessment _1  - Reassessment  of Co-morbidities (includes updates in patient status) 0 _2  - 0 Reassessment of Adherence to Treatment Plan ASSESSMENTS - Wound and Skin Assessment / Reassessment _3  - Simple Wound Assessment / Reassessment - one wound 0 X- 3 5 Complex Wound Assessment / Reassessment - multiple wounds _4  - 0 Dermatologic / Skin Assessment (not related to wound area) ASSESSMENTS - Focused Assessment _5  - Circumferential Edema Measurements - multi extremities 0 _6  - 0 Nutritional Assessment / Counseling / Intervention _7  - 0 Lower Extremity Assessment (monofilament, tuning fork, pulses) _8  - 0 Peripheral Arterial Disease Assessment (using hand held doppler) ASSESSMENTS - Ostomy and/or Continence Assessment and Care _9  - Incontinence Assessment and Management 0 _10  - 0 Ostomy Care Assessment and Management (repouching, etc.) PROCESS - Coordination of Care X - Simple Patient / Family Education for ongoing care 1 15 _11  - 0 Complex (extensive) Patient / Family Education for ongoing care X- 1 10 Staff obtains Programmer, systems, Records, Test Results / Process Orders _12  - 0 Staff telephones HHA, Nursing Homes / Clarify orders / etc _13  - 0 Routine Transfer to another Facility (non-emergent condition) _14  - 0 Routine Hospital Admission (non-emergent condition) _15  - 0 New Admissions / Biomedical engineer / Ordering NPWT, Apligraf, etc. _16  - 0 Emergency Hospital Admission (emergent condition) X- 1 10 Simple Discharge Coordination _17  - 0 Complex (extensive) Discharge Coordination PROCESS - Special Needs _18  - Pediatric / Minor Patient Management 0 _19  - 0 Isolation Patient Management _20  - 0 Hearing / Language / Visual special needs _21  - 0 Assessment of Community assistance (transportation, D/C planning, etc.) _22  - 0 Additional assistance / Altered mentation _23  - 0 Support Surface(s) Assessment (bed, cushion, seat, etc.) INTERVENTIONS - Wound Cleansing / Measurement Antonelli, Ganesh F. ([id number]) _24  -  0 Simple Wound Cleansing - one wound X- 3 5 Complex Wound Cleansing - multiple wounds X- 1 5 Wound Imaging (photographs - any number of wounds) _25  - 0 Wound Tracing (instead  of photographs) _0  - 0 Simple Wound Measurement - one wound X- 3 5 Complex Wound Measurement - multiple wounds INTERVENTIONS - Wound Dressings X - Small Wound Dressing one or multiple wounds 3 10 _1  - 0 Medium Wound Dressing one or multiple wounds _2  - 0 Large Wound Dressing one or multiple wounds X- 1 5 Application of Medications - topical <ZOXWRUEAVWUJWJXB>_1<\/YNWGNFAOZHYQMVHQ>_4  - 0 Application of Medications - injection INTERVENTIONS - Miscellaneous _4  - External ear exam 0 _5  - 0 Specimen Collection (cultures, biopsies, blood, body fluids, etc.) _6  - 0 Specimen(s) / Culture(s) sent or taken to Lab for analysis _7  - 0 Patient Transfer (multiple staff / Civil Service fast streamer / Similar devices) _8  - 0 Simple Staple / Suture removal (25 or less) _9  - 0 Complex Staple / Suture removal (26 or more) _10  - 0 Hypo / Hyperglycemic Management (close monitor of Blood Glucose) _11  - 0 Ankle / Brachial Index (ABI) - do not check if billed separately X- 1 5 Vital Signs Has the patient been seen at the hospital within the last three years: Yes Total Score: 125 Level Of Care: New/Established - Level 4 Electronic Signature(s) Signed: 04/24/2021 3:42:58 PM By: Donnamarie Poag Entered By: Donnamarie Poag on 04/24/2021 09:06:38 Penaflor, Dorothea Glassman ([id number]) -------------------------------------------------------------------------------- Encounter Discharge Information Details Patient Name: [name]. Date of Service: 04/24/2021 8:30 AM Medical Record Number: [id number] Patient Account Number: [id number] Date of Birth/Sex: Apr 17, 1960 (61 y.o. M) Treating RN: Donnamarie Poag Primary Care Hollace Michelli: [name] Other Clinician: Referring Alessandria Henken: [name] Treating Cheston Coury/Extender: Skipper Cliche in Treatment: 1 Encounter Discharge Information  Items Discharge Condition: Stable Ambulatory Status: Ambulatory Discharge Destination: Home Transportation: Private Auto Accompanied By: self Schedule Follow-up Appointment: Yes Clinical Summary of Care: Electronic Signature(s) Signed: 04/24/2021 3:42:58 PM By: Donnamarie Poag Entered By: Donnamarie Poag on 04/24/2021 09:17:39 Smeltz, Dorothea Glassman ([id number]) -------------------------------------------------------------------------------- Lower Extremity Assessment Details Patient Name: [name] Date of Service: 04/24/2021 8:30 AM Medical Record Number: [id number] Patient Account Number: [id number] Date of Birth/Sex: May 10, 1960 (61 y.o. M) Treating RN: Donnamarie Poag Primary Care Jacari Iannello: [name] Other Clinician: Referring Toney Lizaola: [name] Treating Swayze Pries/Extender: Skipper Cliche in Treatment: 1 Edema Assessment Assessed: [Left: Yes] Patrice Paradise: Yes] [Left: Edema] [Right: :] Calf Left: Right: Point of Measurement: 30 cm From Medial Instep 39.5 cm 40 cm Ankle Left: Right: Point of Measurement: 10 cm From Medial Instep 23.5 cm 24 cm Knee To Floor Left: Right: From Medial Instep 40 cm 40 cm Vascular Assessment Pulses: Dorsalis Pedis Palpable: [Left:Yes] [Right:Yes] Electronic Signature(s) Signed: 04/24/2021 3:42:58 PM By: Donnamarie Poag Entered By: Donnamarie Poag on 04/24/2021 08:48:48 Sipos, Raeqwon F. ([id number]) -------------------------------------------------------------------------------- Multi Wound Chart Details Patient Name: [name] Date of Service: 04/24/2021 8:30 AM Medical Record Number: [id number] Patient Account Number: [id number] Date of Birth/Sex: May 06, 1960 (61 y.o. M) Treating RN: Donnamarie Poag Primary Care Jaxyn Rout: [name] Other Clinician: Referring Anaiya Wisinski: [name] Treating Olsen Mccutchan/Extender: Skipper Cliche in Treatment: 1 Vital Signs Height(in): 70 Pulse(bpm): 14 Weight(lbs): 270 Blood Pressure(mmHg): 164/84 Body  Mass Index(BMI): 38.7 Temperature(F): 97.7 Respiratory Rate(breaths/min): 16 Photos: Wound Location: Left, Anterior Lower Leg Right, Medial, Anterior Lower Leg Right, Lateral, Anterior Lower Leg Wounding Event: Blister Blister Blister Primary Etiology: Venous Leg Ulcer Venous Leg Ulcer Venous Leg Ulcer Comorbid History: Cataracts, Lymphedema, Asthma, Cataracts, Lymphedema, Asthma, Cataracts, Lymphedema, Asthma, Sleep Apnea, Arrhythmia, Sleep Apnea, Arrhythmia, Sleep Apnea, Arrhythmia, Congestive Heart Failure, Coronary Congestive Heart Failure, Coronary Congestive Heart Failure, Coronary Artery Disease, Hypertension, Type Artery Disease, Hypertension, Type  Artery Disease, Hypertension, Type II Diabetes, End Stage Renal II Diabetes, End Stage Renal II Diabetes, End Stage Renal Disease, Gout, Osteoarthritis, Disease, Gout, Osteoarthritis, Disease, Gout, Osteoarthritis, Neuropathy Neuropathy Neuropathy Date Acquired: 03/08/2021 03/08/2021 03/08/2021 Weeks of Treatment: _0 Wound Status: Open Open Open Wound Recurrence: No No No Measurements L x W x D (cm) 3x1x0.1 1.5x1.4x0.1 1.5x1x0.1 Area (cm) : 2.356 1.649 1.178 Volume (cm) : 0.236 0.165 0.118 % Reduction in Area: 50.70% 26.30% -212.50% % Reduction in Volume: 50.60% 26.30% -210.50% Classification: Full Thickness Without Exposed Full Thickness Without Exposed Full Thickness Without Exposed Support Structures Support Structures Support Structures Exudate Amount: Medium Medium Medium Exudate Type: Serosanguineous Serosanguineous Serosanguineous Exudate Color: red, brown red, brown red, brown Granulation Amount: Small (1-33%) Small (1-33%) Small (1-33%) Granulation Quality: Pink Red Red Necrotic Amount: Large (67-100%) Large (67-100%) Large (67-100%) Necrotic Tissue: Eschar, Adherent [place] Exposed Structures: Fat Layer (Subcutaneous Tissue): Fat Layer (Subcutaneous Tissue): Fat Layer  (Subcutaneous Tissue): Yes Yes Yes Fascia: No Fascia: No Fascia: No Tendon: No Tendon: No Tendon: No Muscle: No Muscle: No Muscle: No Joint: No Joint: No Joint: No Bone: No Bone: No Bone: No Treatment Notes Electronic Signature(s) Signed: 04/24/2021 3:42:58 PM By: Hassan Rowan ([id number]) Entered By: Donnamarie Poag on 04/24/2021 08:50:03 Kinnison, Dorothea Glassman ([id number]) -------------------------------------------------------------------------------- [place] Details Patient Name: BREYDON, SENTERS. Date of Service: 04/24/2021 8:30 AM Medical Record Number: [id number] Patient Account Number: [id number] Date of Birth/Sex: [birth year] (61 y.o. M) Treating RN: Donnamarie Poag Primary Care Melecio Cueto: [name] Other Clinician: Referring Jaysin Gayler: [name] Treating Takeesha Isley/Extender: Skipper Cliche in Treatment: 1 Active Inactive Wound/Skin Impairment Nursing Diagnoses: Impaired tissue integrity Knowledge deficit related to smoking impact on wound healing Knowledge deficit related to ulceration/compromised skin integrity Goals: Patient/caregiver will verbalize understanding of skin care regimen Date Initiated: 04/16/2021 Date Inactivated: 04/24/2021 Target Resolution Date: 04/27/2021 Goal Status: Met Ulcer/skin breakdown will have a volume reduction of 30% by week 4 Date Initiated: 04/16/2021 Target Resolution Date: 05/14/2021 Goal Status: Active Ulcer/skin breakdown will have a volume reduction of 50% by week 8 Date Initiated: 04/16/2021 Target Resolution Date: 06/11/2021 Goal Status: Active Ulcer/skin breakdown will have a volume reduction of 80% by week 12 Date Initiated: 04/16/2021 Target Resolution Date: 07/09/2021 Goal Status: Active Ulcer/skin breakdown will heal within 14 weeks Date Initiated: 04/16/2021 Target Resolution Date: 07/23/2021 Goal Status: Active Interventions: Assess patient/caregiver ability to obtain necessary  supplies Assess patient/caregiver ability to perform ulcer/skin care regimen upon admission and as needed Assess ulceration(s) every visit Notes: Electronic Signature(s) Signed: 04/24/2021 3:42:58 PM By: Donnamarie Poag Entered By: Donnamarie Poag on 04/24/2021 08:49:02 Macchi, Dorothea Glassman ([id number]) -------------------------------------------------------------------------------- Pain Assessment Details Patient Name: [name]. Date of Service: 04/24/2021 8:30 AM Medical Record Number: [id number] Patient Account Number: [id number] Date of Birth/Sex: 1960-06-13 (61 y.o. M) Treating RN: Donnamarie Poag Primary Care Milta Croson: [name] Other Clinician: Referring Barkley Kratochvil: [name] Treating Mariel Lukins/Extender: Skipper Cliche in Treatment: 1 Active Problems Location of Pain Severity and Description of Pain Patient Has Paino Yes Site Locations Pain Location: Generalized Pain, Pain in Ulcers Rate the pain. Current Pain Level: 3 Pain Management and Medication Current Pain Management: Electronic Signature(s) Signed: 04/24/2021 3:42:58 PM By: Donnamarie Poag Entered By: Donnamarie Poag on 04/24/2021 08:41:11 Renderos, Dorothea Glassman ([id number]) -------------------------------------------------------------------------------- Patient/Caregiver Education Details Patient Name: [name]. Date of Service: 04/24/2021 8:30 AM Medical Record Number: [id number] Patient Account Number: [id number] Date of Birth/Gender: [birth year] (60  y.o. M) Treating RN: Donnamarie Poag Primary Care Physician: [name] Other Clinician: Referring Physician: [name] Treating Physician/Extender: Skipper Cliche in Treatment: 1 Education Assessment Education Provided To: Patient Education Topics Provided Basic Hygiene: Venous: Wound/Skin Impairment: Electronic Signature(s) Signed: 04/24/2021 3:42:58 PM By: Donnamarie Poag Entered By: Donnamarie Poag on 04/24/2021 09:06:53 Adduci, Dorothea Glassman  ([id number]) -------------------------------------------------------------------------------- Wound Assessment Details Patient Name: [name] Date of Service: 04/24/2021 8:30 AM Medical Record Number: [id number] Patient Account Number: [id number] Date of Birth/Sex: [birth year] (61 y.o. M) Treating RN: Donnamarie Poag Primary Care Elayah Klooster: [name] Other Clinician: Referring Ashari Llewellyn: [name] Treating Lasya Vetter/Extender: Skipper Cliche in Treatment: 1 Wound Status Wound Number: 3 Primary Venous Leg Ulcer Etiology: Wound Location: Left, Anterior Lower Leg Wound Open Wounding Event: Blister Status: Date Acquired: 03/08/2021 Comorbid Cataracts, Lymphedema, Asthma, Sleep Apnea, Weeks Of Treatment: 1 History: Arrhythmia, Congestive Heart Failure, Coronary Artery Clustered Wound: No Disease, Hypertension, Type II Diabetes, End Stage Renal Disease, Gout, Osteoarthritis, Neuropathy Photos Wound Measurements Length: (cm) 3 Width: (cm) 1 Depth: (cm) 0.1 Area: (cm) 2.356 Volume: (cm) 0.236 % Reduction in Area: 50.7% % Reduction in Volume: 50.6% Tunneling: No Undermining: No Wound Description Classification: Full Thickness Without Exposed Support Structures Exudate Amount: Medium Exudate Type: Serosanguineous Exudate Color: red, brown Foul Odor After Cleansing: No Slough/Fibrino Yes Wound Bed Granulation Amount: Small (1-33%) Exposed Structure Granulation Quality: Pink Fascia Exposed: No Necrotic Amount: Large (67-100%) Fat Layer (Subcutaneous Tissue) Exposed: Yes Necrotic Quality: Eschar, Adherent Slough Tendon Exposed: No Muscle Exposed: No Joint Exposed: No Bone Exposed: No Treatment Notes Wound #3 (Lower Leg) Wound Laterality: Left, Anterior Cleanser Soap and Water Discharge Instruction: Gently cleanse wound with antibacterial soap, rinse and pat dry prior to dressing wounds Wound Cleanser NORWOOD, QUEZADA. ([id number]) Discharge Instruction: Wash  your hands with soap and water. Remove old dressing, discard into plastic bag and place into trash. Cleanse the wound with Wound Cleanser prior to applying a clean dressing using gauze sponges, not tissues or cotton balls. Do not scrub or use excessive force. Pat dry using gauze sponges, not tissue or cotton balls. Peri-Wound Care Topical Primary Dressing Iodosorb 40 (g) Discharge Instruction: Apply IodoSorb to wound bed only as directed. Secondary Dressing Zetuvit Plus Silicone Border Dressing 4x4 (in/in) Secured With Compression Wrap Compression Stockings Add-Ons Electronic Signature(s) Signed: 04/24/2021 3:42:58 PM By: Donnamarie Poag Entered By: Donnamarie Poag on 04/24/2021 08:45:48 Bisson, Faolan F. ([id number]) -------------------------------------------------------------------------------- Wound Assessment Details Patient Name: [name] Date of Service: 04/24/2021 8:30 AM Medical Record Number: [id number] Patient Account Number: [id number] Date of Birth/Sex: 17-Dec-1960 (61 y.o. M) Treating RN: Donnamarie Poag Primary Care Tamarah Bhullar: [name] Other Clinician: Referring Azlan Hanway: [name] Treating Amandine Covino/Extender: Skipper Cliche in Treatment: 1 Wound Status Wound Number: 4 Primary Venous Leg Ulcer Etiology: Wound Location: Right, Medial, Anterior Lower Leg Wound Open Wounding Event: Blister Status: Date Acquired: 03/08/2021 Comorbid Cataracts, Lymphedema, Asthma, Sleep Apnea, Weeks Of Treatment: 1 History: Arrhythmia, Congestive Heart Failure, Coronary Artery Clustered Wound: No Disease, Hypertension, Type II Diabetes, End Stage Renal Disease, Gout, Osteoarthritis, Neuropathy Photos Wound Measurements Length: (cm) 1.5 Width: (cm) 1.4 Depth: (cm) 0.1 Area: (cm) 1.649 Volume: (cm) 0.165 % Reduction in Area: 26.3% % Reduction in Volume: 26.3% Tunneling: No Undermining: No Wound Description Classification: Full Thickness Without Exposed Support  Structures Exudate Amount: Medium Exudate Type: Serosanguineous Exudate Color: red, brown Foul Odor After Cleansing: No Slough/Fibrino Yes Wound Bed Granulation Amount: Small (1-33%) Exposed Structure Granulation Quality: Red Fascia Exposed:  No Necrotic Amount: Large (67-100%) Fat Layer (Subcutaneous Tissue) Exposed: Yes Necrotic Quality: Eschar, Adherent Slough Tendon Exposed: No Muscle Exposed: No Joint Exposed: No Bone Exposed: No Treatment Notes Wound #4 (Lower Leg) Wound Laterality: Right, Medial, Anterior Cleanser [place] Ancillary Kit - 15 Day Supply Discharge Instruction: Use supplies as instructed; Kit contains: (15) Saline Bullets; (15) 3x3 Gauze; 15 pr Gloves Soap and [address] HIGINIO, GROW F. ([id number]) Discharge Instruction: Gently cleanse wound with antibacterial soap, rinse and pat dry prior to dressing wounds Wound Cleanser Discharge Instruction: Wash your hands with soap and water. Remove old dressing, discard into plastic bag and place into trash. Cleanse the wound with Wound Cleanser prior to applying a clean dressing using gauze sponges, not tissues or cotton balls. Do not scrub or use excessive force. Pat dry using gauze sponges, not tissue or cotton balls. Peri-Wound Care Topical Primary Dressing Iodosorb 40 (g) Discharge Instruction: Apply IodoSorb to wound bed only as directed. Secondary Dressing Zetuvit Plus Silicone Border Dressing 4x4 (in/in) Secured With Compression Wrap Compression Stockings Add-Ons Electronic Signature(s) Signed: 04/24/2021 3:42:58 PM By: Donnamarie Poag Entered By: Donnamarie Poag on 04/24/2021 08:46:45 Pandit, Forrest F. ([id number]) -------------------------------------------------------------------------------- Wound Assessment Details Patient Name: [name] Date of Service: 04/24/2021 8:30 AM Medical Record Number: [id number] Patient Account Number: [id number] Date of Birth/Sex: Apr 15, 1960 (61 y.o. M) Treating RN: Donnamarie Poag Primary Care Lenoir Facchini: [name] Other Clinician: Referring Lawrance Wiedemann: [name] Treating Khaniyah Bezek/Extender: Skipper Cliche in Treatment: 1 Wound Status Wound Number: 5 Primary Venous Leg Ulcer Etiology: Wound Location: Right, Lateral, Anterior Lower Leg Wound Open Wounding Event: Blister Status: Date Acquired: 03/08/2021 Comorbid Cataracts, Lymphedema, Asthma, Sleep Apnea, Weeks Of Treatment: 1 History: Arrhythmia, Congestive Heart Failure, Coronary Artery Clustered Wound: No Disease, Hypertension, Type II Diabetes, End Stage Renal Disease, Gout, Osteoarthritis, Neuropathy Photos Wound Measurements Length: (cm) 1.5 Width: (cm) 1 Depth: (cm) 0.1 Area: (cm) 1.178 Volume: (cm) 0.118 % Reduction in Area: -212.5% % Reduction in Volume: -210.5% Tunneling: No Undermining: No Wound Description Classification: Full Thickness Without Exposed Support Structures Exudate Amount: Medium Exudate Type: Serosanguineous Exudate Color: red, brown Foul Odor After Cleansing: No Slough/Fibrino Yes Wound Bed Granulation Amount: Small (1-33%) Exposed Structure Granulation Quality: Red Fascia Exposed: No Necrotic Amount: Large (67-100%) Fat Layer (Subcutaneous Tissue) Exposed: Yes Necrotic Quality: Eschar, Adherent Slough Tendon Exposed: No Muscle Exposed: No Joint Exposed: No Bone Exposed: No Treatment Notes Wound #5 (Lower Leg) Wound Laterality: Right, Lateral, Anterior Cleanser Soap and Water Discharge Instruction: Gently cleanse wound with antibacterial soap, rinse and pat dry prior to dressing wounds Wound Cleanser RAINE, ELSASS. ([id number]) Discharge Instruction: Wash your hands with soap and water. Remove old dressing, discard into plastic bag and place into trash. Cleanse the wound with Wound Cleanser prior to applying a clean dressing using gauze sponges, not tissues or cotton balls. Do not scrub or use excessive force. Pat dry using gauze sponges, not  tissue or cotton balls. Peri-Wound Care Topical Primary Dressing Iodosorb 40 (g) Discharge Instruction: Apply IodoSorb to wound bed only as directed. Secondary Dressing Zetuvit Plus Silicone Border Dressing 4x4 (in/in) Secured With Compression Wrap Compression Stockings Add-Ons Electronic Signature(s) Signed: 04/24/2021 3:42:58 PM By: Donnamarie Poag Entered By: Donnamarie Poag on 04/24/2021 08:47:32 Mcginnity, Ivar F. ([id number]) -------------------------------------------------------------------------------- [place] Details Patient Name: [name]. Date of Service: 04/24/2021 8:30 AM Medical Record Number: [id number] Patient Account Number: [id number] Date of Birth/Sex: Apr 29, 1960 (61 y.o. M) Treating RN: Donnamarie Poag Primary Care Brownie Gockel: [name] Other Clinician:  Referring Jemell Town: [name] Treating Alline Pio/Extender: Skipper Cliche in Treatment: 1 Vital Signs Time Taken: 08:39 Temperature (F): 97.7 Height (in): 70 Pulse (bpm): 65 Weight (lbs): 270 Respiratory Rate (breaths/min): 16 Body Mass Index (BMI): 38.7 Blood Pressure (mmHg): 164/84 Reference Range: 80 - 120 mg / dl Electronic Signature(s) Signed: 04/24/2021 3:42:58 PM By: Donnamarie Poag Entered ByDonnamarie Poag on 04/24/2021 08:41:02

## 2021-04-24 NOTE — Progress Notes (Addendum)
[name], [name] ([id number]) Visit Report for 04/24/2021 Chief Complaint Document Details Patient Name: [name], [name]. Date of Service: 04/24/2021 8:30 AM Medical Record Number: [id number] Patient Account Number: [id number] Date of Birth/Sex: Oct 03, 1960 (61 y.o. M) Treating RN: Donnamarie Poag Primary Care Provider: [name] Other Clinician: Referring Provider: [name] Treating Provider/Extender: Skipper Cliche in Treatment: 1 Information Obtained from: Patient Chief Complaint Bilateral LE Ulcers Electronic Signature(s) Signed: 04/24/2021 8:52:24 AM By: Worthy Keeler PA-C Entered By: Worthy Keeler on 04/24/2021 08:52:24 [name], [name] ([id number]) -------------------------------------------------------------------------------- HPI Details Patient Name: [name], [name]. Date of Service: 04/24/2021 8:30 AM Medical Record Number: [id number] Patient Account Number: [id number] Date of Birth/Sex: [birth year] (61 y.o. M) Treating RN: Donnamarie Poag Primary Care Provider: [name] Other Clinician: Referring Provider: [name] Treating Provider/Extender: Skipper Cliche in Treatment: 1 History of Present Illness Location: left anterior shin and left medial calf Quality: Patient reports experiencing a dull pain to affected area(s). Severity: Patient states wound are getting worse. Duration: Patient has had the wound for > 3 months prior to seeking treatment at the wound center Timing: Pain in wound is Intermittent (comes and goes Context: The wound appeared gradually over time Modifying Factors: Other treatment(s) tried include:had surgery on the left anterior shin for a hematoma which has been nonhealing for the last 8 months Associated Signs and Symptoms: Patient reports having increase swelling. HPI Description: 61 year old diabetic was last hemoglobin A1c was 7.2% has been reviewed today with 2 ulcerated areas in his left lower extremity which she's had for  about 8 months and the one most recently was started as a blister for 2 weeks. he has a past medical history of atrial fibrillation, coronary artery disease, CHF, chronic venous stasis dermatitis, diabetes mellitus, hypertension, sleep apnea and is status post kidney transplant in 2012. He is a former smoker and quit cigars in 1996. In the remote past he's had some arterial and venous duplex studies done but does not recall the results no was ever treated for venous insufficiency. 06/10/2016 -- -- had a office visit at [facility] seen by the physicianos assistant [facility]. Patient underwent an ABI which showed medial calcification, normal tibial artery Doppler waveform, PPG waveforms and toe brachial indices suggest normal arterial perfusion to bilateral lower extremities. right TBI was 0.97 and left was 0.84 Bilateral venous duplex was notable for venous reflux in the left greater saphenous vein and popliteal veins.. A left lower extremity endovenous laser ablation was recommended. 06/27/2016 -- I understand he spoke to his insurance company and there is a 3 month waiting period with conservative therapy prior to authorizing surgical intervention. 07/12/16- he is here for follow-up evaluation of a left lower extremity ulcer. He did bring his compression stockings today. He is voicing no complaints or concerns Readmission: 04/16/2021 upon evaluation today patient appears for reevaluation here in the clinic he was last seen in April 2018. At that time he was having issues with venous stasis ulcers and that again is what he is dealing with today as well. Fortunately I do not see any signs of active infection at this time which is great news and overall very pleased in that regard. Nonetheless he does have some areas of eschar which is going require debridement to clear away some of the necrotic debris so that we can hopefully get this healed as quickly as possible. That was  discussed with the patient today as well. The patient does have diabetes mellitus  type 2 for which she does have a hemoglobin A1c of 7.27 November 2020. He also has been on Keflex for what was felt to be cellulitis that seems to be doing better these wounds began on March 08, 2021. The patient also has a history of lymphedema, chronic venous insufficiency, and is a kidney transplant patient. 04/24/2021 upon evaluation today patient appears to be doing okay in regard to his wounds. Fortunately there does not appear to be any evidence of active infection locally nor systemically at this time which is good news. Nonetheless he was not able to keep the compression wraps on I think this is going be a little bit more problem he tells me his anxiety was to the point he just was not able to do it. I do understand but at the same time that is good to make things somewhat difficult to be honest. Electronic Signature(s) Signed: 04/24/2021 9:34:34 AM By: Worthy Keeler PA-C Entered By: Worthy Keeler on 04/24/2021 09:34:34 [name], [name] ([id number]) -------------------------------------------------------------------------------- Physical Exam Details Patient Name: [name], [name]. Date of Service: 04/24/2021 8:30 AM Medical Record Number: [id number] Patient Account Number: [id number] Date of Birth/Sex: Feb 07, 1961 (61 y.o. M) Treating RN: Donnamarie Poag Primary Care Provider: [name] Other Clinician: Referring Provider: [name] Treating Provider/Extender: Skipper Cliche in Treatment: 1 Constitutional Well-nourished and well-hydrated in no acute distress. Respiratory normal breathing without difficulty. Psychiatric this patient is able to make decisions and demonstrates good insight into disease process. Alert and Oriented x 3. pleasant and cooperative. Notes Upon inspection patient's wound bed actually showed signs of good granulation and epithelization at this point. Fortunately  there does not appear to be any signs of active infection locally nor systemically at this point. Electronic Signature(s) Signed: 04/24/2021 9:35:02 AM By: Worthy Keeler PA-C Entered By: Worthy Keeler on 04/24/2021 09:35:02 Hall, [name] ([id number]) -------------------------------------------------------------------------------- Physician Orders Details Patient Name: [name], [name]. Date of Service: 04/24/2021 8:30 AM Medical Record Number: [id number] Patient Account Number: [id number] Date of Birth/Sex: Feb 26, 1961 (61 y.o. M) Treating RN: Donnamarie Poag Primary Care Provider: [name] Other Clinician: Referring Provider: [name] Treating Provider/Extender: Skipper Cliche in Treatment: 1 Verbal / Phone Orders: No Diagnosis Coding ICD-10 Coding Code Description E11.622 Type 2 diabetes mellitus with other skin ulcer I89.0 Lymphedema, not elsewhere classified L97.822 Non-pressure chronic ulcer of other part of left lower leg with fat layer exposed L97.812 Non-pressure chronic ulcer of other part of right lower leg with fat layer exposed I87.312 Chronic venous hypertension (idiopathic) with ulcer of left lower extremity I87.311 Chronic venous hypertension (idiopathic) with ulcer of right lower extremity Z94.0 Kidney transplant status Follow-up Appointments o Return Appointment in 1 week. o Nurse Visit as needed Bathing/ Shower/ Hygiene o May shower; gently cleanse wound with antibacterial soap, rinse and pat dry prior to dressing wounds o May shower with wound dressing protected with water repellent cover or cast protector. o No tub bath. Anesthetic (Use 'Patient Medications' Section for Anesthetic Order Entry) o Lidocaine applied to wound bed Edema Control - Lymphedema / Segmental Compressive Device / Other o Optional: One layer of unna paste to top of compression wrap (to act as an anchor). o Tubigrip single layer applied. - Bilateral Tubi  E o Elevate leg(s) parallel to the floor when sitting. o DO YOUR BEST to sleep in the bed at night. DO NOT sleep in your recliner. Long hours of sitting in a recliner leads to swelling of the legs and/or  potential wounds on your backside. Additional Orders / Instructions o Follow Nutritious Diet and Increase Protein Intake - continue to monitor blood sugar o Other: - Keep vascular appt for ABI on 05/02/21 Wound Treatment Wound #3 - Lower Leg Wound Laterality: Left, Anterior Cleanser: Soap and Water 3 x Per Week/15 Days Discharge Instructions: Gently cleanse wound with antibacterial soap, rinse and pat dry prior to dressing wounds Cleanser: Wound Cleanser 3 x Per Week/15 Days Discharge Instructions: Wash your hands with soap and water. Remove old dressing, discard into plastic bag and place into trash. Cleanse the wound with Wound Cleanser prior to applying a clean dressing using gauze sponges, not tissues or cotton balls. Do not scrub or use excessive force. Pat dry using gauze sponges, not tissue or cotton balls. Primary Dressing: Iodosorb 40 (g) (DME) (Generic) 3 x Per Week/15 Days Discharge Instructions: Apply IodoSorb to wound bed only as directed. Secondary Dressing: Zetuvit Plus Silicone Border Dressing 4x4 (in/in) (DME) (Generic) 3 x Per Week/15 Days Wound #4 - Lower Leg Wound Laterality: Right, Medial, Anterior Cleanser: Byram Ancillary Kit - 15 Day Supply (DME) (Generic) 3 x Per Week/15 Days Discharge Instructions: Use supplies as instructed; Kit contains: (15) Saline Bullets; (15) 3x3 Gauze; 15 pr Gloves Cleanser: Soap and Water 3 x Per Week/15 Days [name], [name] ([id number]) Discharge Instructions: Gently cleanse wound with antibacterial soap, rinse and pat dry prior to dressing wounds Cleanser: Wound Cleanser 3 x Per Week/15 Days Discharge Instructions: Wash your hands with soap and water. Remove old dressing, discard into plastic bag and place into trash. Cleanse the  wound with Wound Cleanser prior to applying a clean dressing using gauze sponges, not tissues or cotton balls. Do not scrub or use excessive force. Pat dry using gauze sponges, not tissue or cotton balls. Primary Dressing: Iodosorb 40 (g) (DME) (Generic) 3 x Per Week/15 Days Discharge Instructions: Apply IodoSorb to wound bed only as directed. Secondary Dressing: Zetuvit Plus Silicone Border Dressing 4x4 (in/in) (DME) (Generic) 3 x Per Week/15 Days Wound #5 - Lower Leg Wound Laterality: Right, Lateral, Anterior Cleanser: Soap and Water 3 x Per Week/15 Days Discharge Instructions: Gently cleanse wound with antibacterial soap, rinse and pat dry prior to dressing wounds Cleanser: Wound Cleanser 3 x Per Week/15 Days Discharge Instructions: Wash your hands with soap and water. Remove old dressing, discard into plastic bag and place into trash. Cleanse the wound with Wound Cleanser prior to applying a clean dressing using gauze sponges, not tissues or cotton balls. Do not scrub or use excessive force. Pat dry using gauze sponges, not tissue or cotton balls. Primary Dressing: Iodosorb 40 (g) (DME) (Generic) 3 x Per Week/15 Days Discharge Instructions: Apply IodoSorb to wound bed only as directed. Secondary Dressing: Zetuvit Plus Silicone Border Dressing 4x4 (in/in) (DME) (Generic) 3 x Per Week/15 Days Electronic Signature(s) Signed: 04/24/2021 3:42:58 PM By: Donnamarie Poag Signed: 04/24/2021 5:15:34 PM By: Worthy Keeler PA-C Entered By: Donnamarie Poag on 04/24/2021 09:03:56 [name], [name] ([id number]) -------------------------------------------------------------------------------- Problem List Details Patient Name: [name], [name]. Date of Service: 04/24/2021 8:30 AM Medical Record Number: [id number] Patient Account Number: [id number] Date of Birth/Sex: [birth year] (61 y.o. M) Treating RN: Donnamarie Poag Primary Care Provider: [name] Other Clinician: Referring Provider: [name] Treating  Provider/Extender: Skipper Cliche in Treatment: 1 Active Problems ICD-10 Encounter Code Description Active Date MDM Diagnosis E11.622 Type 2 diabetes mellitus with other skin ulcer 04/16/2021 No Yes I89.0 Lymphedema, not elsewhere classified 04/16/2021 No Yes L97.822 Non-pressure  chronic ulcer of other part of left lower leg with fat layer 04/16/2021 No Yes exposed L97.812 Non-pressure chronic ulcer of other part of right lower leg with fat layer 04/16/2021 No Yes exposed I87.312 Chronic venous hypertension (idiopathic) with ulcer of left lower 04/16/2021 No Yes extremity I87.311 Chronic venous hypertension (idiopathic) with ulcer of right lower 04/16/2021 No Yes extremity Z94.0 Kidney transplant status 04/16/2021 No Yes Inactive Problems Resolved Problems Electronic Signature(s) Signed: 04/24/2021 8:52:04 AM By: Worthy Keeler PA-C Entered By: Worthy Keeler on 04/24/2021 08:52:04 Fister, [name] ([id number]) -------------------------------------------------------------------------------- Progress Note Details Patient Name: Carl Speller F. Date of Service: 04/24/2021 8:30 AM Medical Record Number: [id number] Patient Account Number: [id number] Date of Birth/Sex: [birth year] (61 y.o. M) Treating RN: Donnamarie Poag Primary Care Provider: [name] Other Clinician: Referring Provider: [name] Treating Provider/Extender: Skipper Cliche in Treatment: 1 Subjective Chief Complaint Information obtained from Patient Bilateral LE Ulcers History of Present Illness (HPI) The following HPI elements were documented for the patient's wound: Location: left anterior shin and left medial calf Quality: Patient reports experiencing a dull pain to affected area(s). Severity: Patient states wound are getting worse. Duration: Patient has had the wound for > 3 months prior to seeking treatment at the wound center Timing: Pain in wound is Intermittent (comes and goes Context: The wound  appeared gradually over time Modifying Factors: Other treatment(s) tried include:had surgery on the left anterior shin for a hematoma which has been nonhealing for the last 8 months Associated Signs and Symptoms: Patient reports having increase swelling. 61 year old diabetic was last hemoglobin A1c was 7.2% has been reviewed today with 2 ulcerated areas in his left lower extremity which she's had for about 8 months and the one most recently was started as a blister for 2 weeks. he has a past medical history of atrial fibrillation, coronary artery disease, CHF, chronic venous stasis dermatitis, diabetes mellitus, hypertension, sleep apnea and is status post kidney transplant in 2012. He is a former smoker and quit cigars in 1996. In the remote past he's had some arterial and venous duplex studies done but does not recall the results no was ever treated for venous insufficiency. 06/10/2016 -- -- had a office visit at [facility] seen by the physician s assistant Marcelle Overlie. Patient underwent an ABI which showed medial calcification, normal tibial artery Doppler waveform, PPG waveforms and toe brachial indices suggest normal arterial perfusion to bilateral lower extremities. right TBI was 0.97 and left was 0.84 Bilateral venous duplex was notable for venous reflux in the left greater saphenous vein and popliteal veins.. A left lower extremity endovenous laser ablation was recommended. 06/27/2016 -- I understand he spoke to his insurance company and there is a 3 month waiting period with conservative therapy prior to authorizing surgical intervention. 07/12/16- he is here for follow-up evaluation of a left lower extremity ulcer. He did bring his compression stockings today. He is voicing no complaints or concerns Readmission: 04/16/2021 upon evaluation today patient appears for reevaluation here in the clinic he was last seen in April 2018. At that time he was having issues with  venous stasis ulcers and that again is what he is dealing with today as well. Fortunately I do not see any signs of active infection at this time which is great news and overall very pleased in that regard. Nonetheless he does have some areas of eschar which is going require debridement to clear away some of the necrotic debris so that we  can hopefully get this healed as quickly as possible. That was discussed with the patient today as well. The patient does have diabetes mellitus type 2 for which she does have a hemoglobin A1c of 7.27 November 2020. He also has been on Keflex for what was felt to be cellulitis that seems to be doing better these wounds began on March 08, 2021. The patient also has a history of lymphedema, chronic venous insufficiency, and is a kidney transplant patient. 04/24/2021 upon evaluation today patient appears to be doing okay in regard to his wounds. Fortunately there does not appear to be any evidence of active infection locally nor systemically at this time which is good news. Nonetheless he was not able to keep the compression wraps on I think this is going be a little bit more problem he tells me his anxiety was to the point he just was not able to do it. I do understand but at the same time that is good to make things somewhat difficult to be honest. Objective Constitutional Well-nourished and well-hydrated in no acute distress. [name], [name] ([id number]) Vitals Time Taken: 8:39 AM, Height: 70 in, Weight: 270 lbs, BMI: 38.7, Temperature: 97.7 F, Pulse: 65 bpm, Respiratory Rate: 16 breaths/min, Blood Pressure: 164/84 mmHg. Respiratory normal breathing without difficulty. Psychiatric this patient is able to make decisions and demonstrates good insight into disease process. Alert and Oriented x 3. pleasant and cooperative. General Notes: Upon inspection patient's wound bed actually showed signs of good granulation and epithelization at this point. Fortunately  there does not appear to be any signs of active infection locally nor systemically at this point. Integumentary (Hair, Skin) Wound #3 status is Open. Original cause of wound was Blister. The date acquired was: 03/08/2021. The wound has been in treatment 1 weeks. The wound is located on the Left,Anterior Lower Leg. The wound measures 3cm length x 1cm width x 0.1cm depth; 2.356cm^2 area and 0.236cm^3 volume. There is Fat Layer (Subcutaneous Tissue) exposed. There is no tunneling or undermining noted. There is a medium amount of serosanguineous drainage noted. There is small (1-33%) pink granulation within the wound bed. There is a large (67-100%) amount of necrotic tissue within the wound bed including Eschar and Adherent Slough. Wound #4 status is Open. Original cause of wound was Blister. The date acquired was: 03/08/2021. The wound has been in treatment 1 weeks. The wound is located on the Right,Medial,Anterior Lower Leg. The wound measures 1.5cm length x 1.4cm width x 0.1cm depth; 1.649cm^2 area and 0.165cm^3 volume. There is Fat Layer (Subcutaneous Tissue) exposed. There is no tunneling or undermining noted. There is a medium amount of serosanguineous drainage noted. There is small (1-33%) red granulation within the wound bed. There is a large (67-100%) amount of necrotic tissue within the wound bed including Eschar and Adherent Slough. Wound #5 status is Open. Original cause of wound was Blister. The date acquired was: 03/08/2021. The wound has been in treatment 1 weeks. The wound is located on the Right,Lateral,Anterior Lower Leg. The wound measures 1.5cm length x 1cm width x 0.1cm depth; 1.178cm^2 area and 0.118cm^3 volume. There is Fat Layer (Subcutaneous Tissue) exposed. There is no tunneling or undermining noted. There is a medium amount of serosanguineous drainage noted. There is small (1-33%) red granulation within the wound bed. There is a large (67-100%) amount of necrotic tissue within  the wound bed including Eschar and Adherent Slough. Assessment Active Problems ICD-10 Type 2 diabetes mellitus with other skin ulcer Lymphedema, not elsewhere  classified Non-pressure chronic ulcer of other part of left lower leg with fat layer exposed Non-pressure chronic ulcer of other part of right lower leg with fat layer exposed Chronic venous hypertension (idiopathic) with ulcer of left lower extremity Chronic venous hypertension (idiopathic) with ulcer of right lower extremity Kidney transplant status Plan Follow-up Appointments: Return Appointment in 1 week. Nurse Visit as needed Bathing/ Shower/ Hygiene: May shower; gently cleanse wound with antibacterial soap, rinse and pat dry prior to dressing wounds May shower with wound dressing protected with water repellent cover or cast protector. No tub bath. Anesthetic (Use 'Patient Medications' Section for Anesthetic Order Entry): Lidocaine applied to wound bed Edema Control - Lymphedema / Segmental Compressive Device / Other: Optional: One layer of unna paste to top of compression wrap (to act as an anchor). Tubigrip single layer applied. - Bilateral Tubi E Elevate leg(s) parallel to the floor when sitting. DO YOUR BEST to sleep in the bed at night. DO NOT sleep in your recliner. Long hours of sitting in a recliner leads to swelling of the legs and/or potential wounds on your backside. Additional Orders / Instructions: Follow Nutritious Diet and Increase Protein Intake - continue to monitor blood sugar Other: - Keep vascular appt for ABI on 05/02/21 WOUND #3: - Lower Leg Wound Laterality: Left, Anterior Cleanser: Soap and Water 3 x Per Week/15 Days [name], [name] ([id number]) Discharge Instructions: Gently cleanse wound with antibacterial soap, rinse and pat dry prior to dressing wounds Cleanser: Wound Cleanser 3 x Per Week/15 Days Discharge Instructions: Wash your hands with soap and water. Remove old dressing, discard into  plastic bag and place into trash. Cleanse the wound with Wound Cleanser prior to applying a clean dressing using gauze sponges, not tissues or cotton balls. Do not scrub or use excessive force. Pat dry using gauze sponges, not tissue or cotton balls. Primary Dressing: Iodosorb 40 (g) (DME) (Generic) 3 x Per Week/15 Days Discharge Instructions: Apply IodoSorb to wound bed only as directed. Secondary Dressing: Zetuvit Plus Silicone Border Dressing 4x4 (in/in) (DME) (Generic) 3 x Per Week/15 Days WOUND #4: - Lower Leg Wound Laterality: Right, Medial, Anterior Cleanser: Byram Ancillary Kit - 15 Day Supply (DME) (Generic) 3 x Per Week/15 Days Discharge Instructions: Use supplies as instructed; Kit contains: (15) Saline Bullets; (15) 3x3 Gauze; 15 pr Gloves Cleanser: Soap and Water 3 x Per Week/15 Days Discharge Instructions: Gently cleanse wound with antibacterial soap, rinse and pat dry prior to dressing wounds Cleanser: Wound Cleanser 3 x Per Week/15 Days Discharge Instructions: Wash your hands with soap and water. Remove old dressing, discard into plastic bag and place into trash. Cleanse the wound with Wound Cleanser prior to applying a clean dressing using gauze sponges, not tissues or cotton balls. Do not scrub or use excessive force. Pat dry using gauze sponges, not tissue or cotton balls. Primary Dressing: Iodosorb 40 (g) (DME) (Generic) 3 x Per Week/15 Days Discharge Instructions: Apply IodoSorb to wound bed only as directed. Secondary Dressing: Zetuvit Plus Silicone Border Dressing 4x4 (in/in) (DME) (Generic) 3 x Per Week/15 Days WOUND #5: - Lower Leg Wound Laterality: Right, Lateral, Anterior Cleanser: Soap and Water 3 x Per Week/15 Days Discharge Instructions: Gently cleanse wound with antibacterial soap, rinse and pat dry prior to dressing wounds Cleanser: Wound Cleanser 3 x Per Week/15 Days Discharge Instructions: Wash your hands with soap and water. Remove old dressing, discard into  plastic bag and place into trash. Cleanse the wound with Wound Cleanser prior to  applying a clean dressing using gauze sponges, not tissues or cotton balls. Do not scrub or use excessive force. Pat dry using gauze sponges, not tissue or cotton balls. Primary Dressing: Iodosorb 40 (g) (DME) (Generic) 3 x Per Week/15 Days Discharge Instructions: Apply IodoSorb to wound bed only as directed. Secondary Dressing: Zetuvit Plus Silicone Border Dressing 4x4 (in/in) (DME) (Generic) 3 x Per Week/15 Days 1. Would recommend that we go ahead and continue with the recommendation for compression that we can use Tubigrip instead of a compression wrap simply because I think he may tolerate this better and take it off at night he can wear it during the day. 2. I am also can recommend that we have the patient continue with the dressings although we will switch to Iodosorb to try to help clean up the surface of the wound a little bit better. I think that is good to be a much better way to go. 3. I am also can recommend that we have the patient continue to elevate his legs much as possible to help with edema control the more this he can do the better. We will see patient back for reevaluation in 1 week here in the clinic. If anything worsens or changes patient will contact our office for additional recommendations. Electronic Signature(s) Signed: 04/24/2021 9:35:49 AM By: Worthy Keeler PA-C Entered By: Worthy Keeler on 04/24/2021 09:35:48 Blahut, [name] ([id number]) -------------------------------------------------------------------------------- SuperBill Details Patient Name: Carl Shams. Date of Service: 04/24/2021 Medical Record Number: [id number] Patient Account Number: [id number] Date of Birth/Sex: 1960/12/18 (61 y.o. M) Treating RN: Donnamarie Poag Primary Care Provider: [name] Other Clinician: Referring Provider: [name] Treating Provider/Extender: Skipper Cliche in Treatment:  1 Diagnosis Coding ICD-10 Codes Code Description [phone] Type 2 diabetes mellitus with other skin ulcer I89.0 Lymphedema, not elsewhere classified L97.822 Non-pressure chronic ulcer of other part of left lower leg with fat layer exposed L97.812 Non-pressure chronic ulcer of other part of right lower leg with fat layer exposed I87.312 Chronic venous hypertension (idiopathic) with ulcer of left lower extremity I87.311 Chronic venous hypertension (idiopathic) with ulcer of right lower extremity Z94.0 Kidney transplant status Facility Procedures CPT4 Code: 21624469 Description: 99214 - WOUND CARE VISIT-LEV 4 EST PT Modifier: Quantity: 1 Physician Procedures CPT4 Code: [id number] Description: 99214 - WC PHYS LEVEL 4 - EST PT Modifier: Quantity: 1 CPT4 Code: Description: ICD-10 Diagnosis Description E11.622 Type 2 diabetes mellitus with other skin ulcer I89.0 Lymphedema, not elsewhere classified L97.822 Non-pressure chronic ulcer of other part of left lower leg with fat lay L97.812 Non-pressure chronic ulcer  of other part of right lower leg with fat la Modifier: er exposed yer exposed Quantity: Electronic Signature(s) Signed: 04/24/2021 9:36:04 AM By: Worthy Keeler PA-C Entered By: Worthy Keeler on 04/24/2021 09:36:03

## 2021-04-25 DIAGNOSIS — S81809A Unspecified open wound, unspecified lower leg, initial encounter: Secondary | ICD-10-CM | POA: Diagnosis not present

## 2021-04-26 ENCOUNTER — Encounter: Payer: Self-pay | Admitting: Psychology

## 2021-04-26 ENCOUNTER — Ambulatory Visit: Payer: 59 | Admitting: Psychology

## 2021-04-26 ENCOUNTER — Other Ambulatory Visit: Payer: Self-pay

## 2021-04-26 ENCOUNTER — Ambulatory Visit (INDEPENDENT_AMBULATORY_CARE_PROVIDER_SITE_OTHER): Payer: 59 | Admitting: Psychology

## 2021-04-26 DIAGNOSIS — R41840 Attention and concentration deficit: Secondary | ICD-10-CM | POA: Insufficient documentation

## 2021-04-26 DIAGNOSIS — K219 Gastro-esophageal reflux disease without esophagitis: Secondary | ICD-10-CM | POA: Insufficient documentation

## 2021-04-26 DIAGNOSIS — I6781 Acute cerebrovascular insufficiency: Secondary | ICD-10-CM

## 2021-04-26 DIAGNOSIS — F331 Major depressive disorder, recurrent, moderate: Secondary | ICD-10-CM

## 2021-04-26 DIAGNOSIS — I5042 Chronic combined systolic (congestive) and diastolic (congestive) heart failure: Secondary | ICD-10-CM

## 2021-04-26 DIAGNOSIS — R4189 Other symptoms and signs involving cognitive functions and awareness: Secondary | ICD-10-CM

## 2021-04-26 HISTORY — DX: Attention and concentration deficit: R41.840

## 2021-04-26 NOTE — Progress Notes (Addendum)
NEUROPSYCHOLOGICAL EVALUATION [place] [place] Department of Neurology  Date of Evaluation: April 26, 2021  Reason for Referral:   Carl Jeriel Vivanco. is a 61 y.o. mixed-handed Hispanic male referred by  [name], M.D. , to characterize his current cognitive functioning and assist with diagnostic clarity and treatment planning in the context of subjective cognitive decline.   Assessment and Plan:   Clinical Impression(s): [name] pattern of performance is suggestive of neuropsychological functioning largely within normal limits relative to age-matched peers. He did exhibit some difficulty with basic attention/concentration, as well as when asked to differentiate stimuli from non-stimuli across memory tasks. However, I do not believe that these areas of difficulty arise to where a formal cognitive disorder is warranted. Performance was appropriate across processing speed, complex attention/concentration, executive functioning, safety/judgment, receptive and expressive language, visuospatial abilities, and both encoding and retrieval aspects of memory. [name] denied difficulties completing instrumental activities of daily living (ADLs) independently.   The likely etiology for some attentional weaknesses across testing and reported day-to-day dysfunction is likely multifactorial in nature. From a neurological perspective, a past head CT revealed some small vessel ischemic changes. I was unable to locate a past brain MRI or more recent imaging to understand the true scope and progressive nature of these changes. However, given current medical ailments (i.e., coronary artery disease, atrial fibrillation, hypertension, congestive heart failure, type II diabetes, hyperlipidemia, and obstructive sleep apnea), there would seem a fairly high likelihood of a brain MRI detecting vascular changes in the brain. Attentional dysregulation is a common byproduct of  cerebrovascular changes, as is diminished processing speed and executive dysfunction. There remains the potential that this is the primary culprit for objective and subjective weaknesses.   Outside of this, he reported ongoing sleep dysfunction, stating that he is "lucky" if he is able to get four hours of sleep nightly. He also described moderate symptoms of both anxiety and depression occurring within the past 1-2 weeks. The combination of chronic sleep dysfunction and psychiatric distress can certainly create and worsen cognitive dysfunction, especially with regard to attentional dysregulation. It could also serve to worsen changes due to a vascular culprit.   Memory patterns are not concerning for early-onset Alzheimer's disease. I do not see compelling evidence for concerns surrounding Lewy body dementia, frontotemporal dementia, or a more rare parkinsonian presentation. Continued medical monitoring will be important moving forward.  Recommendations: A repeat neuropsychological evaluation in 24-36 months (or sooner if functional decline is noted) could be beneficial in assessing the trajectory of future cognitive decline should it occur. This would also aid in future efforts towards improved diagnostic clarity.  I would recommend that he speak with his PCP surrounding a referral for a brain MRI to allow for a better understanding of possible vascular contributions for objective and subjective dysfunction.   A combination of medication and psychotherapy has been shown to be most effective at treating symptoms of anxiety and depression. As such, [name] is encouraged to speak with his prescribing physician regarding medication adjustments to optimally manage these symptoms. Likewise, [name] could consider engaging in short-term psychotherapy to address symptoms of psychiatric distress. He would benefit from an active and collaborative therapeutic environment, rather than one purely supportive in  nature. Recommended treatment modalities include Cognitive Behavioral Therapy (CBT) or Acceptance and Commitment Therapy (ACT).  He should also discuss ways to improve the quantity and quality of his sleep with his PCP. He also may benefit from a  medical professional assuring that current CPAP settings are appropriate so that he is maximizing the benefit of using this device.   [name] is encouraged to attend to lifestyle factors for brain health (e.g., regular physical exercise, good nutrition habits, regular participation in cognitively-stimulating activities, and general stress management techniques), which are likely to have benefits for both emotional adjustment and cognition. In fact, in addition to promoting good general health, regular exercise incorporating aerobic activities (e.g., brisk walking, jogging, cycling, etc.) has been demonstrated to be a very effective treatment for depression and stress, with similar efficacy rates to both antidepressant medication and psychotherapy. Optimal control of vascular risk factors (including safe cardiovascular exercise and adherence to dietary recommendations) is encouraged. Continued participation in activities which provide mental stimulation and social interaction is also recommended.   When learning new information, he would benefit from information being broken up into small, manageable pieces. He may also find it helpful to articulate the material in his own words and in a context to promote encoding at the onset of a new task. This material may need to be repeated multiple times to promote encoding.  Memory can be improved using internal strategies such as rehearsal, repetition, chunking, mnemonics, association, and imagery. External strategies such as written notes in a consistently used memory journal, visual and nonverbal auditory cues such as a calendar on the refrigerator or appointments with alarm, such as on a cell phone, can also help  maximize recall.    To address problems with fluctuating attention, he may wish to consider:   -Avoiding external distractions when needing to concentrate   -Limiting exposure to fast paced environments with multiple sensory demands   -Writing down complicated information and using checklists   -Attempting and completing one task at a time (i.e., no multi-tasking)   -Verbalizing aloud each step of a task to maintain focus   -Reducing the amount of information considered at one time  Review of Records:   [name] was seen by his PCP [name], M.D.) on 10/06/2020 for routine follow-up. At that time, he described ongoing deficits with attention/concentration. He noted that performing paperwork or other multi-step projects had become more challenging. At that time, he reported adequate sleep (6-7 hours, wakes feeling refreshed) and denied ongoing mood concerns. Ultimately, [name] was referred for a comprehensive neuropsychological evaluation to characterize his cognitive abilities and to assist with diagnostic clarity and treatment planning.  Head CT on 02/25/2018 was negative. Head CT on 11/05/2018 revealed some small vessel ischemic changes but was otherwise unremarkable. I was unable to locate any more recent imaging.   Past Medical History:  Diagnosis Date   Acute gout of right elbow 05/25/2020   Allergy    seasonal   Asthma, persistent controlled 02/25/2013   Atypical pneumonia 07/01/2019   Benign neoplasm of colon    Body aches 01/01/2021   BPH with obstruction/lower urinary tract symptoms 09/25/2014   CAD (coronary artery disease) 2005   a.  Status post four-vessel CABG in 2005; b. 03/2018 MV: Small, fixed inferoapical and apical septal defect. No ischemia. EF 47% (60-65% by 05/2018 Echo).   Cellulitis and abscess of left leg 07/22/2020   Cervical sprain 03/02/2018   CHF (congestive heart failure) 11/05/2018   Chronic atrial fibrillation    a. CHADS2VASc 4 (CHF, HTN, DM,  vascular disease)--> Eliquis; b. 09/2018 Zio: Chronic Afib.   Chronic combined systolic and diastolic CHF (congestive heart failure)    a.  7/18 Echo: EF 45-50%;  b. 05/2018 Echo: EF 60-65%, RVSP 74.8; c. 12/2018 Echo: EF 50-55%. Sev dil LA; d. 04/2019 Echo: EF 45-50%, glob HK. Mod reduced RV fxn. RVSP 53.81mHg.   Chronic venous insufficiency 06/05/2016   Chronic venous stasis dermatitis of both lower extremities    Cytomegaloviral disease 2017   Diverticulosis of colon 04/22/2013   Dyspnea    Erectile dysfunction due to arterial insufficiency 10/20/2019   ESRD (end stage renal disease) 2005   a. HD 2004-2012; b. S/p renal transplant in 2012   Essential hypertension 12/17/2010   GERD (gastroesophageal reflux disease)    Gout attack 03/30/2018   History of renal transplant 03/01/2013   Hyperlipidemia    low since renal failure   Hypogonadism in male 09/25/2014   Immunosuppressed status 09/09/2014   Major depressive disorder 10/04/2013   Obesity hypoventilation syndrome 03/31/2012   Obstructive sleep apnea 09/29/2012   NPSG [place] 03/15/12- AHI 57.9/ hr, desaturation to 78.8%, body weight 310 lbs   PAH (pulmonary artery hypertension)    a. 05/2018 Echo: RVSP 74.839mg; b. 12/2018 RV not well visualized; c. 04/2019 RHC: RA 19, RV 80/20, PA 78/31 (51), PCWP 25, CO/CI 7.69/3.26. PVR 3.25 WU-->Sev PAH, likely primarily PV HTN.   Proteinuria 06/23/2015   Pulmonary hypertension due to left heart disease 01/20/2019   Purpura    Superficial thrombophlebitis 12/23/2018   Synovitis of finger 06/11/2019   Trigger finger, unspecified little finger 12/23/2018   Type 2 diabetes mellitus with hyperglycemia, with long-term current use of insulin 09/09/2014   Ulcer 05/2016   Left shin   Wears glasses     Past Surgical History:  Procedure Laterality Date   [place]   CATARACT EXTRACTION W/PHACO Right 10/29/2017    Procedure: CATARACT EXTRACTION PHACO AND INTRAOCULAR LENS PLACEMENT ([place] DIABETIC;  Surgeon: BrLeandrew KoyanagiMD;  [facility];  Laterality: Right;  Diabetic - insulin   CATARACT EXTRACTION W/PHACO Left 11/18/2017   Procedure: CATARACT EXTRACTION PHACO AND INTRAOCULAR LENS PLACEMENT ([place]/TOPICAL;  Surgeon: BrLeandrew KoyanagiMD;  [facility];  Laterality: Left;  DIABETES - insulin sleep apnea   COLONOSCOPY WITH PROPOFOL N/A 04/22/2013   Procedure: COLONOSCOPY WITH PROPOFOL;  Surgeon: DaMilus BanisterMD;  [facility]: [facility];  Service: Endoscopy;  Laterality: N/A;   CORONARY ARTERY BYPASS GRAFT  2005   X 4   DG ANGIO AV SHUNT*L*     right and left upper arms   FASCIOTOMY  03/03/2012   Procedure: FASCIOTOMY;  Surgeon: GaWynonia SoursMD;  [place] [facility];  Laterality: Right;  FASCIOTOMY RIGHT SMALL FINGER   FASCIOTOMY Left 08/17/2013   Procedure: FASCIOTOMY LEFT RING;  Surgeon: GaWynonia SoursMD;  [facility];  Laterality: Left;   INCISION AND DRAINAGE ABSCESS Left 10/15/2015   Procedure: INCISION AND DRAINAGE ABSCESS;  Surgeon: DiJules HusbandsMD;  Location: [facility];  Service: General;  Laterality: Left;   KIDNEY TRANSPLANT  09/13/2010   [facility] 11/15/2016   Procedure: RIGHT HEART CATH;  Surgeon: ArWellington HampshireMD;  [facility];  Service: Cardiovascular;  Laterality: N/A;   RIGHT HEART CATH N/A 05/03/2019   Procedure: RIGHT HEART CATH;  Surgeon: McLarey DresserMD;  [facility];  Service: Cardiovascular;  Laterality: N/A;   TYMPANIC MEMBRANE REPAIR  1/12   left   VASECTOMY      Current Outpatient Medications:    albuterol (PROVENTIL) (2.5 MG/3ML) 0.083% nebulizer solution, Take 3 mLs (2.5 mg total) by nebulization every 6  (six) hours as needed for wheezing or shortness of breath., Disp: 150 mL, Rfl: 0   albuterol (VENTOLIN HFA) 108 (90 Base) MCG/ACT inhaler, Inhale 2 puffs into the lungs every 6 (six) hours as needed for wheezing or shortness of breath., Disp: 18 g, Rfl: 5   apixaban (ELIQUIS) 5 MG TABS tablet, TAKE 1 TABLET BY MOUTH TWICE DAILY, Disp: 180 tablet, Rfl: 1   beclomethasone (QVAR) 80 MCG/ACT inhaler, Inhale 2 puffs into the lungs 2 (two) times daily., Disp: 10.6 g, Rfl: 5   buPROPion (WELLBUTRIN SR) 150 MG 12 hr tablet, TAKE 1 TABLET BY MOUTH TWICE DAILY, Disp: 180 tablet, Rfl: 3   cholecalciferol (VITAMIN D3) 25 MCG (1000 UNIT) tablet, Take 1,000 Units by mouth daily., Disp: , Rfl:    colchicine 0.6 MG tablet, Take 1 tablet (0.6 mg total) by mouth every other day, Disp: 30 tablet, Rfl: 1   Cyanocobalamin (B-12 PO), Take by mouth., Disp: , Rfl:    dapagliflozin propanediol (FARXIGA) 10 MG TABS tablet, TAKE 1 TABLET (10 MG TOTAL) BY MOUTH DAILY BEFORE BREAKFAST., Disp: 30 tablet, Rfl: 6   Febuxostat 80 MG TABS, Take 1 tablet (80 mg total) by mouth once daily for 180 days, Disp: 90 tablet, Rfl: 1   fluticasone (FLONASE) 50 MCG/ACT nasal spray, PLACE 2 SPRAYS INTO BOTH NOSTRILS DAILY., Disp: 16 g, Rfl: 11   gabapentin (NEURONTIN) 300 MG capsule, TAKE 1 CAPSULE BY MOUTH TWICE DAILY, Disp: 180 capsule, Rfl: 1   HUMALOG KWIKPEN 100 UNIT/ML KwikPen, Inject 5-10 Units into the skin 3 (three) times daily. Sliding scale, Disp: , Rfl:    insulin glargine-yfgn (SEMGLEE) 100 UNIT/ML Pen, Inject 40 Units subcutaneously once daily, Disp: 15 mL, Rfl: 5   iron polysaccharides (FERREX 150) 150 MG capsule, TAKE 1 CAPSULE BY MOUTH 2 TIMES DAILY., Disp: 180 capsule, Rfl: 1   losartan (COZAAR) 25 MG tablet, Take 1 tablet (25 mg total) by mouth daily., Disp: 90 tablet, Rfl: 3   metolazone (ZAROXOLYN) 2.5 MG tablet, Take 1 tablet by mouth 2 days per week, Disp: 10 tablet, Rfl: 0   montelukast (SINGULAIR) 10 MG tablet, TAKE 1  TABLET BY MOUTH AT BEDTIME., Disp: 90 tablet, Rfl: 3   Multiple Vitamin (MULTIVITAMIN WITH MINERALS) TABS tablet, Take 1 tablet by mouth daily., Disp: , Rfl:    mycophenolate (MYFORTIC) 180 MG EC tablet, Take 360 mg by mouth 2 (two) times daily., Disp: , Rfl:    omeprazole (PRILOSEC) 20 MG capsule, TAKE 1 CAPSULE BY MOUTH DAILY., Disp: 90 capsule, Rfl: 3   Potassium Chloride CR (MICRO-K) 8 MEQ CPCR capsule CR, Take 9 capsules (72 mEq total) by mouth 2 (two) times daily., Disp: 450 capsule, Rfl: 3   predniSONE (DELTASONE) 5 MG tablet, Take 1 tablet (5 mg total) by mouth daily., Disp: 90 tablet, Rfl: 3   rosuvastatin (CRESTOR) 10 MG tablet, TAKE 1 TABLET BY MOUTH DAILY, Disp: 90 tablet, Rfl: 2   spironolactone (ALDACTONE) 25 MG tablet, Take 1/2 tablet daily, Disp: , Rfl:    sulfamethoxazole-trimethoprim (BACTRIM) 400-80 MG tablet, TAKE 1 TABLET BY MOUTH EVERY MONDAY, WEDNESDAY, FRIDAY., Disp: 36 tablet, Rfl: 3   tacrolimus (PROGRAF) 1 MG capsule, Take 1  capsule (1 mg total) by mouth 2 times daily., Disp: 60 capsule, Rfl: 5   tadalafil, PAH, (ADCIRCA) 20 MG tablet, TAKE 1 TABLET (20 MG TOTAL) BY MOUTH EVERY OTHER DAY., Disp: 60 tablet, Rfl: 1   tamsulosin (FLOMAX) 0.4 MG CAPS capsule, TAKE 1 CAPSULE BY MOUTH DAILY., Disp: 90 capsule, Rfl: 3   torsemide (DEMADEX) 100 MG tablet, Take 1 tablet (100 mg total) by mouth 2 (two) times daily., Disp: 60 tablet, Rfl: 4   vitamin C (ASCORBIC ACID) 250 MG tablet, Take 1 tablet (250 mg total) by mouth daily., Disp: 30 tablet, Rfl: 0   zinc sulfate 220 (50 Zn) MG capsule, Take 220 mg by mouth daily., Disp: , Rfl:   Clinical Interview:   The following information was obtained during a clinical interview with [name] and his wife prior to cognitive testing.  Cognitive Symptoms: Decreased short-term memory: Endorsed. He described occasional difficulties recalling past conversations or the names of familiar individuals. He also noted some instances where he will  lose his train of thought while speaking. His wife was generally in agreement. Difficulties were said to have been present for the past year or so and have seemed largely stable. Decreased long-term memory: Denied. Decreased attention/concentration: Endorsed. He described increased difficulties with sustained focus and increased distractibility. He noted often starting projects and leaving them unfinished or otherwise having trouble following tasks through to completion. Difficulties were said to be noticeable during the past year or so.  Reduced processing speed: Endorsed. Difficulties with executive functions: Denied. He did acknowledge decision making taking a bit longer due to diminished processing speed. He and his wife denied trouble with impulsivity or any significant personality changes.  Difficulties with emotion regulation: Denied. Difficulties with receptive language: Denied. Difficulties with word finding: Endorsed. Decreased visuoperceptual ability: Denied.  Difficulties completing ADLs: Denied.  Additional Medical History: History of traumatic brain injury/concussion: Denied. History of stroke: Denied. History of seizure activity: Denied. History of known exposure to toxins: Denied. Symptoms of chronic pain: Endorsed. He described occasional knee pain but did not note frequently debilitating symptoms.  Experience of frequent headaches/migraines: Denied. Frequent instances of dizziness/vertigo: Denied.  Sensory changes: He wears glasses and hearing aids with benefit. Other sensory changes/difficulties (e.g., taste or smell) were denied.  Balance/coordination difficulties: Endorsed. He described his balance as "not as good as it used to be." The cause for this was unknown. He did not report consistent instability or one side of the body being worse than the other. He also denied any recent falls.  Other motor difficulties: Denied.  Sleep History: Estimated hours obtained each  night: He reported being "lucky to get four" hours on a nightly basis. This pattern of sleep dysfunction was said to be ongoing for the past several months.  Difficulties falling asleep: Denied. Difficulties staying asleep: Endorsed. He will wake frequently to use the restroom. He also described instances where he will wake due to an unknown reason.  Feels rested and refreshed upon awakening: Denied.  History of snoring: Endorsed. History of waking up gasping for air: Endorsed. Witnessed breath cessation while asleep: Endorsed. He has a history of obstructive sleep apnea and reported using his CPAP machine nightly.  History of vivid dreaming: Denied. Excessive movement while asleep: Denied. Instances of acting out his dreams: Denied.  Psychiatric/Behavioral Health History: Depression: He described his current mood as "on the edge a lot." His wife also described him as a bit more "snappy." There is a history of mild depressive  symptoms and he does take Wellbutrin. He was unsure if this medication was currently yielding any benefit. Current or remote suicidal ideation, intent, or plan was denied.  Anxiety: Denied. Mania: Denied. Trauma History: Denied. Visual/auditory hallucinations: Denied. Delusional thoughts: Denied.  Tobacco: Denied. Alcohol: He denied current alcohol consumption as well as a history of problematic alcohol abuse or dependence.  Recreational drugs: Denied.  Family History: Problem Relation Age of Onset   Heart disease Father    Kidney failure Father    Kidney disease Father    Diabetes Maternal Grandmother    Breast cancer Maternal Grandmother    Valvular heart disease Mother    Liver cancer Paternal Uncle    Liver cancer Paternal Grandmother    Prostate cancer Neg Hx    This information was confirmed by [name].  Academic/Vocational History: Highest level of educational attainment: 14 years. He graduated from high school and reported earning two  Associate's degrees. He described himself as a good (A/B) student in academic settings. English was noted as a relative weakness in earlier academic settings.  History of developmental delay: Denied. History of grade repetition: Denied. Enrollment in special education courses: Denied. History of LD/ADHD: Denied.  Employment: Retired. He recently worked as a Scientist, research (medical).   Evaluation Results:   Behavioral Observations: [name] was accompanied by his wife, arrived to his appointment on time, and was appropriately dressed and groomed. He appeared alert and oriented. Observed gait and station were within normal limits. Gross motor functioning appeared intact upon informal observation and no abnormal movements (e.g., tremors) were noted. His affect was generally relaxed and positive. Spontaneous speech was fluent and word finding difficulties were not observed during the clinical interview. Thought processes were coherent, organized, and normal in content. Insight into his cognitive difficulties appeared adequate. During testing, sustained attention was appropriate. Task engagement was adequate and he persisted when challenged. Overall, [name] was cooperative with the clinical interview and subsequent testing procedures.   Adequacy of Effort: The validity of neuropsychological testing is limited by the extent to which the individual being tested may be assumed to have exerted adequate effort during testing. [name] expressed his intention to perform to the best of his abilities and exhibited adequate task engagement and persistence. Scores across stand-alone and embedded performance validity measures were mildly variable but overall within expectation. As such, the results of the current evaluation are believed to be a valid representation of [name] current cognitive functioning.  Test Results: [name] was largely oriented at the time of the current evaluation. Points  were lost for him being unsure of the name of the current clinic.  Intellectual abilities based upon educational and vocational attainment were estimated to be in the average range. Premorbid abilities were estimated to be within the below average range based upon a single-word reading test.   Processing speed was below average to average. Basic attention was exceptionally low. More complex attention (e.g., working memory) was average. He did not have any atypical performances across a continuous performance task to suggest underlying disorder characterized by attentional deficits. Executive functioning was below average to average. Performance on a task assessing safety and judgment was also average.   While not directly assessed, receptive language abilities were believed to be intact. Likewise, [name] did not exhibit any difficulties comprehending task instructions and answered all questions asked of him appropriately. Assessed expressive language (e.g., verbal fluency and confrontation naming) was below average to average.  Assessed visuospatial/visuoconstructional abilities were below average to average. Points were lost on his copy of a complex figure largely due to a rapid and somewhat sloppy approach.    Learning (i.e., encoding) of novel verbal and visual information was below average to average. Spontaneous delayed recall (i.e., retrieval) of previously learned information was also below average to average. Retention rates were 72% across a story learning task, 100% across a list learning task, and 100% across a shape learning task. Performance across recognition tasks was variable, ranging from the exceptionally low to above average normative ranges, suggesting evidence for information consolidation, as well as trouble differentiating targets from foils.   Results of emotional screening instruments suggested that recent symptoms of generalized anxiety were in the moderate range, while  symptoms of depression were also within the moderate range. A screening instrument assessing recent sleep quality suggested the presence of moderate sleep dysfunction.  Tables of Scores:   Note: This summary of test scores accompanies the interpretive report and should not be considered in isolation without reference to the appropriate sections in the text. Descriptors are based on appropriate normative data and may be adjusted based on clinical judgment. Terms such as "Within Normal Limits" and "Outside Normal Limits" are used when a more specific description of the test score cannot be determined.       Percentile - Normative Descriptor > 98 - Exceptionally High 91-97 - Well Above Average 75-90 - Above Average 25-74 - Average 9-24 - Below Average 2-8 - Well Below Average < 2 - Exceptionally Low       Validity:   DESCRIPTOR       ACS Word Choice: --- --- Within Normal Limits  Dot Counting Test: --- --- Within Normal Limits  NAB EVI: --- --- Outside Normal Limits  D-KEFS Color Word Effort Index: --- --- Within Normal Limits       Orientation:      Raw Score Percentile   NAB Orientation, Form 1 27/29 --- ---       Cognitive Screening:      Raw Score Percentile   SLUMS: 23/30 --- ---       Intellectual Functioning:      Standard Score Percentile   Test of Premorbid Functioning: 88 21 Below Average       Memory:     NAB Memory Module, Form 1: Standard Score/ T Score Percentile   Total Memory Index 89 23 Below Average  List Learning       Total Trials 1-3 16/36 (38) 12 Below Average    List B 3/12 (42) 21 Below Average    Short Delay Free Recall 5/12 (41) 18 Below Average    Long Delay Free Recall 5/12 (42) 21 Below Average    Retention Percentage 100 (51) 54 Average    Recognition Discriminability -1 (22) <1 Exceptionally Low  Shape Learning       Total Trials 1-3 17/27 (55) 69 Average    Delayed Recall 7/9 (60) 84 Above Average    Retention Percentage 100 (51) 54 Average     Recognition Discriminability 9 (61) 86 Above Average  Story Learning       Immediate Recall 47/80 (39) 14 Below Average    Delayed Recall 23/40 (38) 12 Below Average    Retention Percentage 72 (36) 8 Well Below Average  Daily Living Memory       Immediate Recall 42/51 (51) 54 Average    Delayed Recall 12/17 (43) 25 Average  Retention Percentage 75 (40) 16 Below Average    Recognition Hits 8/10 (42) 21 Below Average       Attention/Executive Function:     Trail Making Test (TMT): Raw Score (T Score) Percentile     Part A 31 secs.,  0 errors (49) 46 Average    Part B 122 secs.,  1 error (39) 14 Below Average         Scaled Score Percentile   WAIS-IV Coding: 6 9 Below Average       NAB Attention Module, Form 1: T Score Percentile     Digits Forward 27 1 Exceptionally Low    Digits Backwards 53 62 Average       Conners CPT 3: T Score Percentile     d' 57 75 High Average    Omissions 51 54 Average    Commissions 53 62 Average    Perseverations 56 73 High Average    HRT 58 79 A Little Slow    HRT SD 43 25 Low    Variability 46 34 Average       D-KEFS Color-Word Interference Test: Raw Score (Scaled Score) Percentile     Color Naming 41 secs. (6) 9 Below Average    Word Reading 28 secs. (8) 25 Average    Inhibition 70 secs. (10) 50 Average      Total Errors 1 errors (11) 63 Average    Inhibition/Switching 79 secs. (10) 50 Average      Total Errors 3 errors (10) 50 Average       NAB Executive Functions Module, Form 1: T Score Percentile     Judgment 55 69 Average       Language:     Verbal Fluency Test: Raw Score (T Score) Percentile     Phonemic Fluency (FAS) 32 (42) 21 Below Average    Animal Fluency 17 (46) 34 Average        NAB Language Module, Form 1: T Score Percentile     Naming 31/31 (55) 69 Average       Visuospatial/Visuoconstruction:      Raw Score Percentile   Clock Drawing: 10/10 --- Within Normal Limits       NAB Spatial Module, Form 1: T Score  Percentile     Figure Drawing Copy 39 14 Below Average        Scaled Score Percentile   WAIS-IV Block Design: 11 63 Average       Mood and Personality:      Raw Score Percentile   [name] Depression Inventory - II: 24 --- Moderate  PROMIS Anxiety Questionnaire: 23 --- Moderate       Additional Questionnaires:      Raw Score Percentile   PROMIS Sleep Disturbance Questionnaire: 34 --- Moderate   Informed Consent and Coding/Compliance:   The current evaluation represents a clinical evaluation for the purposes previously outlined by the referral source and is in no way reflective of a forensic evaluation.   [name]. Genis was provided with a verbal description of the nature and purpose of the present neuropsychological evaluation. Also reviewed were the foreseeable risks and/or discomforts and benefits of the procedure, limits of confidentiality, and mandatory reporting requirements of this provider. The patient was given the opportunity to ask questions and receive answers about the evaluation. Oral consent to participate was provided by the patient.   This evaluation was conducted by Christia Reading, Ph.D., ABPP-CN, board certified clinical neuropsychologist. [name]. Chaput completed a clinical interview  with Dr. Melvyn Novas, billed as one unit [phone], and 145 minutes of cognitive testing and scoring, billed as one unit [phone] and four additional units [id number]. Psychometrist Milana Kidney, B.S., assisted Dr. Melvyn Novas with test administration and scoring procedures. As a separate and discrete service, Dr. Melvyn Novas spent a total of 160 minutes in interpretation and report writing billed as one unit [phone] and two units [id number].

## 2021-04-26 NOTE — Progress Notes (Signed)
Psychometrician Note   Cognitive testing was administered to [name]. by Milana Kidney, B.S. (psychometrist) under the supervision of Dr. Christia Reading, Ph.D., licensed psychologist on 04/26/21. [name] did not appear overtly distressed by the testing session per behavioral observation or responses across self-report questionnaires. Rest breaks were offered.    The battery of tests administered was selected by Dr. Christia Reading, Ph.D. with consideration to [name] current level of functioning, the nature of his symptoms, emotional and behavioral responses during interview, level of literacy, observed level of motivation/effort, and the nature of the referral question. This battery was communicated to the psychometrist. Communication between Dr. Christia Reading, Ph.D. and the psychometrist was ongoing throughout the evaluation and Dr. Christia Reading, Ph.D. was immediately accessible at all times. Dr. Christia Reading, Ph.D. provided supervision to the psychometrist on the date of this service to the extent necessary to assure the quality of all services provided.    [name]. will return within approximately 1-2 weeks for an interactive feedback session with Dr. Melvyn Novas at which time his test performances, clinical impressions, and treatment recommendations will be reviewed in detail. [name] understands he can contact our office should he require our assistance before this time.  A total of 145 minutes of billable time were spent face-to-face with [name] by the psychometrist. This includes both test administration and scoring time. Billing for these services is reflected in the clinical report generated by Dr. Christia Reading, Ph.D.  This note reflects time spent with the psychometrician and does not include test scores or any clinical interpretations made by Dr. Melvyn Novas. The full report will follow in a separate note.

## 2021-04-27 DIAGNOSIS — G4733 Obstructive sleep apnea (adult) (pediatric): Secondary | ICD-10-CM | POA: Diagnosis not present

## 2021-04-27 DIAGNOSIS — E662 Morbid (severe) obesity with alveolar hypoventilation: Secondary | ICD-10-CM | POA: Diagnosis not present

## 2021-04-30 ENCOUNTER — Encounter: Payer: 59 | Attending: Physician Assistant | Admitting: Physician Assistant

## 2021-04-30 ENCOUNTER — Other Ambulatory Visit: Payer: Self-pay

## 2021-04-30 ENCOUNTER — Encounter: Payer: Self-pay | Admitting: Psychology

## 2021-04-30 DIAGNOSIS — I2510 Atherosclerotic heart disease of native coronary artery without angina pectoris: Secondary | ICD-10-CM | POA: Diagnosis not present

## 2021-04-30 DIAGNOSIS — E11622 Type 2 diabetes mellitus with other skin ulcer: Secondary | ICD-10-CM | POA: Insufficient documentation

## 2021-04-30 DIAGNOSIS — Z940 Kidney transplant status: Secondary | ICD-10-CM | POA: Insufficient documentation

## 2021-04-30 DIAGNOSIS — E1122 Type 2 diabetes mellitus with diabetic chronic kidney disease: Secondary | ICD-10-CM | POA: Insufficient documentation

## 2021-04-30 DIAGNOSIS — M1990 Unspecified osteoarthritis, unspecified site: Secondary | ICD-10-CM | POA: Insufficient documentation

## 2021-04-30 DIAGNOSIS — G4730 Sleep apnea, unspecified: Secondary | ICD-10-CM | POA: Insufficient documentation

## 2021-04-30 DIAGNOSIS — L97812 Non-pressure chronic ulcer of other part of right lower leg with fat layer exposed: Secondary | ICD-10-CM | POA: Insufficient documentation

## 2021-04-30 DIAGNOSIS — M109 Gout, unspecified: Secondary | ICD-10-CM | POA: Diagnosis not present

## 2021-04-30 DIAGNOSIS — N186 End stage renal disease: Secondary | ICD-10-CM | POA: Diagnosis not present

## 2021-04-30 DIAGNOSIS — I890 Lymphedema, not elsewhere classified: Secondary | ICD-10-CM | POA: Insufficient documentation

## 2021-04-30 DIAGNOSIS — I132 Hypertensive heart and chronic kidney disease with heart failure and with stage 5 chronic kidney disease, or end stage renal disease: Secondary | ICD-10-CM | POA: Diagnosis not present

## 2021-04-30 DIAGNOSIS — E1140 Type 2 diabetes mellitus with diabetic neuropathy, unspecified: Secondary | ICD-10-CM | POA: Diagnosis not present

## 2021-04-30 DIAGNOSIS — L97822 Non-pressure chronic ulcer of other part of left lower leg with fat layer exposed: Secondary | ICD-10-CM | POA: Diagnosis not present

## 2021-04-30 DIAGNOSIS — I87311 Chronic venous hypertension (idiopathic) with ulcer of right lower extremity: Secondary | ICD-10-CM | POA: Insufficient documentation

## 2021-04-30 DIAGNOSIS — I509 Heart failure, unspecified: Secondary | ICD-10-CM | POA: Insufficient documentation

## 2021-04-30 DIAGNOSIS — E1165 Type 2 diabetes mellitus with hyperglycemia: Secondary | ICD-10-CM | POA: Diagnosis not present

## 2021-04-30 MED ORDER — GABAPENTIN 300 MG PO CAPS
300.0000 mg | ORAL_CAPSULE | Freq: Two times a day (BID) | ORAL | 0 refills | Status: DC
  Filled 2021-04-30: qty 180, 90d supply, fill #0

## 2021-04-30 MED ORDER — TACROLIMUS 1 MG PO CAPS
ORAL_CAPSULE | ORAL | 5 refills | Status: DC
  Filled 2021-04-30: qty 120, 30d supply, fill #0
  Filled 2021-05-28: qty 120, 30d supply, fill #1
  Filled 2021-06-22: qty 120, 30d supply, fill #2
  Filled 2021-08-07: qty 120, 30d supply, fill #3
  Filled 2021-09-21: qty 120, 30d supply, fill #4
  Filled 2021-10-25: qty 120, 30d supply, fill #5

## 2021-04-30 MED FILL — Dapagliflozin Propanediol Tab 10 MG (Base Equivalent): ORAL | 30 days supply | Qty: 30 | Fill #5 | Status: AC

## 2021-04-30 NOTE — Progress Notes (Addendum)
FUE, CERVENKA ([id number]) Visit Report for 04/30/2021 Chief Complaint Document Details Patient Name: [name]. Date of Service: 04/30/2021 8:30 AM Medical Record Number: [id number] Patient Account Number: [id number] Date of Birth/Sex: 1960-09-13 (61 y.o. M) Treating RN: Donnamarie Poag Primary Care Provider: [name] Other Clinician: Referring Provider: [name] Treating Provider/Extender: Skipper Cliche in Treatment: 2 Information Obtained from: Patient Chief Complaint Bilateral LE Ulcers Electronic Signature(s) Signed: 04/30/2021 8:44:29 AM By: Worthy Keeler PA-C Entered By: Worthy Keeler on 04/30/2021 08:44:29 [name] ([id number]) -------------------------------------------------------------------------------- HPI Details Patient Name: [name]. Date of Service: 04/30/2021 8:30 AM Medical Record Number: [id number] Patient Account Number: [id number] Date of Birth/Sex: Jun 28, 1960 (61 y.o. M) Treating RN: Donnamarie Poag Primary Care Provider: [name] Other Clinician: Referring Provider: [name] Treating Provider/Extender: Skipper Cliche in Treatment: 2 History of Present Illness Location: left anterior shin and left medial calf Quality: Patient reports experiencing a dull pain to affected area(s). Severity: Patient states wound are getting worse. Duration: Patient has had the wound for > 3 months prior to seeking treatment at the wound center Timing: Pain in wound is Intermittent (comes and goes Context: The wound appeared gradually over time Modifying Factors: Other treatment(s) tried include:had surgery on the left anterior shin for a hematoma which has been nonhealing for the last 8 months Associated Signs and Symptoms: Patient reports having increase swelling. HPI Description: 61 year old diabetic was last hemoglobin A1c was 7.2% has been reviewed today with 2 ulcerated areas in his left lower extremity which she's had for  about 8 months and the one most recently was started as a blister for 2 weeks. he has a past medical history of atrial fibrillation, coronary artery disease, CHF, chronic venous stasis dermatitis, diabetes mellitus, hypertension, sleep apnea and is status post kidney transplant in 2012. He is a former smoker and quit cigars in 1996. In the remote past he's had some arterial and venous duplex studies done but does not recall the results no was ever treated for venous insufficiency. 06/10/2016 -- -- had a office visit at [facility] seen by the physicianos assistant [facility]. Patient underwent an ABI which showed medial calcification, normal tibial artery Doppler waveform, PPG waveforms and toe brachial indices suggest normal arterial perfusion to bilateral lower extremities. right TBI was 0.97 and left was 0.84 Bilateral venous duplex was notable for venous reflux in the left greater saphenous vein and popliteal veins.. A left lower extremity endovenous laser ablation was recommended. 06/27/2016 -- I understand he spoke to his insurance company and there is a 3 month waiting period with conservative therapy prior to authorizing surgical intervention. 07/12/16- he is here for follow-up evaluation of a left lower extremity ulcer. He did bring his compression stockings today. He is voicing no complaints or concerns Readmission: 04/16/2021 upon evaluation today patient appears for reevaluation here in the clinic he was last seen in April 2018. At that time he was having issues with venous stasis ulcers and that again is what he is dealing with today as well. Fortunately I do not see any signs of active infection at this time which is great news and overall very pleased in that regard. Nonetheless he does have some areas of eschar which is going require debridement to clear away some of the necrotic debris so that we can hopefully get this healed as quickly as possible. That was  discussed with the patient today as well. The patient does have diabetes mellitus  type 2 for which she does have a hemoglobin A1c of 7.27 November 2020. He also has been on Keflex for what was felt to be cellulitis that seems to be doing better these wounds began on March 08, 2021. The patient also has a history of lymphedema, chronic venous insufficiency, and is a kidney transplant patient. 04/24/2021 upon evaluation today patient appears to be doing okay in regard to his wounds. Fortunately there does not appear to be any evidence of active infection locally nor systemically at this time which is good news. Nonetheless he was not able to keep the compression wraps on I think this is going be a little bit more problem he tells me his anxiety was to the point he just was not able to do it. I do understand but at the same time that is good to make things somewhat difficult to be honest. 04/30/2021 upon evaluation today patient appears to be doing well with regard to his wounds on his legs. Fortunately I do not see any signs of active infection locally or systemically at this time which is great news. No fevers, chills, nausea, vomiting, or diarrhea. Electronic Signature(s) Signed: 04/30/2021 9:09:04 AM By: Worthy Keeler PA-C Entered By: Worthy Keeler on 04/30/2021 09:09:04 [name] ([id number]) -------------------------------------------------------------------------------- Physical Exam Details Patient Name: [name]. Date of Service: 04/30/2021 8:30 AM Medical Record Number: [id number] Patient Account Number: [id number] Date of Birth/Sex: [birth year] (61 y.o. M) Treating RN: Donnamarie Poag Primary Care Provider: [name] Other Clinician: Referring Provider: [name] Treating Provider/Extender: Skipper Cliche in Treatment: 2 Constitutional Well-nourished and well-hydrated in no acute distress. Respiratory normal breathing without difficulty. Psychiatric this  patient is able to make decisions and demonstrates good insight into disease process. Alert and Oriented x 3. pleasant and cooperative. Notes Upon inspection patient's wound bed actually showed signs of good granulation utilization at this point. Fortunately I do not see any evidence of infection locally or systemically which is great news and overall I am very pleased with where we stand today. Electronic Signature(s) Signed: 04/30/2021 9:09:30 AM By: Worthy Keeler PA-C Entered By: Worthy Keeler on 04/30/2021 09:09:30 [name] ([id number]) -------------------------------------------------------------------------------- Physician Orders Details Patient Name: [name]. Date of Service: 04/30/2021 8:30 AM Medical Record Number: [id number] Patient Account Number: [id number] Date of Birth/Sex: [birth year] (61 y.o. M) Treating RN: Donnamarie Poag Primary Care Provider: [name] Other Clinician: Referring Provider: [name] Treating Provider/Extender: Skipper Cliche in Treatment: 2 Verbal / Phone Orders: No Diagnosis Coding ICD-10 Coding Code Description E11.622 Type 2 diabetes mellitus with other skin ulcer I89.0 Lymphedema, not elsewhere classified L97.822 Non-pressure chronic ulcer of other part of left lower leg with fat layer exposed L97.812 Non-pressure chronic ulcer of other part of right lower leg with fat layer exposed I87.312 Chronic venous hypertension (idiopathic) with ulcer of left lower extremity I87.311 Chronic venous hypertension (idiopathic) with ulcer of right lower extremity Z94.0 Kidney transplant status Follow-up Appointments o Return Appointment in 1 week. o Nurse Visit as needed Bathing/ Shower/ Hygiene o May shower; gently cleanse wound with antibacterial soap, rinse and pat dry prior to dressing wounds o May shower with wound dressing protected with water repellent cover or cast protector. o No tub bath. Anesthetic (Use  'Patient Medications' Section for Anesthetic Order Entry) o Lidocaine applied to wound bed Edema Control - Lymphedema / Segmental Compressive Device / Other o Optional: One layer of unna paste to top of compression wrap (to  act as an anchor). o Patient to wear own compression stockings. Remove compression stockings every night before going to bed and put on every morning when getting up. o Elevate leg(s) parallel to the floor when sitting. o DO YOUR BEST to sleep in the bed at night. DO NOT sleep in your recliner. Long hours of sitting in a recliner leads to swelling of the legs and/or potential wounds on your backside. Additional Orders / Instructions o Follow Nutritious Diet and Increase Protein Intake - continue to monitor blood sugar o Other: - Keep vascular appt for ABI on 05/02/21 Wound Treatment Wound #3 - Lower Leg Wound Laterality: Left, Anterior Cleanser: Soap and Water 3 x Per Week/15 Days Discharge Instructions: Gently cleanse wound with antibacterial soap, rinse and pat dry prior to dressing wounds Cleanser: Wound Cleanser 3 x Per Week/15 Days Discharge Instructions: Wash your hands with soap and water. Remove old dressing, discard into plastic bag and place into trash. Cleanse the wound with Wound Cleanser prior to applying a clean dressing using gauze sponges, not tissues or cotton balls. Do not scrub or use excessive force. Pat dry using gauze sponges, not tissue or cotton balls. Primary Dressing: IODOFLEX 0.9% Cadexomer Iodine Pad 3 x Per Week/15 Days Discharge Instructions: Apply Iodoflex to wound bed only as directed. Secondary Dressing: Zetuvit Plus Silicone Border Dressing 4x4 (in/in) (Generic) 3 x Per Week/15 Days Wound #4 - Lower Leg Wound Laterality: Right, Medial, Anterior Cleanser: Byram Ancillary Kit - 15 Day Supply (Generic) 3 x Per Week/15 Days Discharge Instructions: Use supplies as instructed; Kit contains: (15) Saline Bullets; (15) 3x3 Gauze; 15 pr  Gloves [name], [name] ([id number]) Cleanser: Soap and Water 3 x Per Week/15 Days Discharge Instructions: Gently cleanse wound with antibacterial soap, rinse and pat dry prior to dressing wounds Cleanser: Wound Cleanser 3 x Per Week/15 Days Discharge Instructions: Wash your hands with soap and water. Remove old dressing, discard into plastic bag and place into trash. Cleanse the wound with Wound Cleanser prior to applying a clean dressing using gauze sponges, not tissues or cotton balls. Do not scrub or use excessive force. Pat dry using gauze sponges, not tissue or cotton balls. Primary Dressing: IODOFLEX 0.9% Cadexomer Iodine Pad 3 x Per Week/15 Days Discharge Instructions: Apply Iodoflex to wound bed only as directed. Secondary Dressing: Zetuvit Plus Silicone Border Dressing 4x4 (in/in) (Generic) 3 x Per Week/15 Days Wound #5 - Lower Leg Wound Laterality: Right, Lateral, Anterior Cleanser: Soap and Water 3 x Per Week/15 Days Discharge Instructions: Gently cleanse wound with antibacterial soap, rinse and pat dry prior to dressing wounds Cleanser: Wound Cleanser 3 x Per Week/15 Days Discharge Instructions: Wash your hands with soap and water. Remove old dressing, discard into plastic bag and place into trash. Cleanse the wound with Wound Cleanser prior to applying a clean dressing using gauze sponges, not tissues or cotton balls. Do not scrub or use excessive force. Pat dry using gauze sponges, not tissue or cotton balls. Primary Dressing: IODOFLEX 0.9% Cadexomer Iodine Pad 3 x Per Week/15 Days Discharge Instructions: Apply Iodoflex to wound bed only as directed. Secondary Dressing: Zetuvit Plus Silicone Border Dressing 4x4 (in/in) (Generic) 3 x Per Week/15 Days Electronic Signature(s) Signed: 04/30/2021 5:00:48 PM By: Donnamarie Poag Signed: 05/01/2021 10:02:31 AM By: Worthy Keeler PA-C Entered By: Donnamarie Poag on 04/30/2021 08:56:52 [name], [name]  ([id number]) -------------------------------------------------------------------------------- Problem List Details Patient Name: [name], MONTS. Date of Service: 04/30/2021 8:30 AM Medical Record Number: [id number] Patient Account  Number: [id number] Date of Birth/Sex: 02-Jan-1961 (61 y.o. M) Treating RN: Donnamarie Poag Primary Care Provider: [name] Other Clinician: Referring Provider: [name] Treating Provider/Extender: Skipper Cliche in Treatment: 2 Active Problems ICD-10 Encounter Code Description Active Date MDM Diagnosis E11.622 Type 2 diabetes mellitus with other skin ulcer 04/16/2021 No Yes I89.0 Lymphedema, not elsewhere classified 04/16/2021 No Yes L97.822 Non-pressure chronic ulcer of other part of left lower leg with fat layer 04/16/2021 No Yes exposed L97.812 Non-pressure chronic ulcer of other part of right lower leg with fat layer 04/16/2021 No Yes exposed I87.312 Chronic venous hypertension (idiopathic) with ulcer of left lower 04/16/2021 No Yes extremity I87.311 Chronic venous hypertension (idiopathic) with ulcer of right lower 04/16/2021 No Yes extremity Z94.0 Kidney transplant status 04/16/2021 No Yes Inactive Problems Resolved Problems Electronic Signature(s) Signed: 04/30/2021 8:42:28 AM By: Worthy Keeler PA-C Entered By: Worthy Keeler on 04/30/2021 08:42:28 [name] ([id number]) -------------------------------------------------------------------------------- Progress Note Details Patient Name: Carl Speller F. Date of Service: 04/30/2021 8:30 AM Medical Record Number: [id number] Patient Account Number: [id number] Date of Birth/Sex: Dec 18, 1960 (61 y.o. M) Treating RN: Donnamarie Poag Primary Care Provider: [name] Other Clinician: Referring Provider: [name] Treating Provider/Extender: Skipper Cliche in Treatment: 2 Subjective Chief Complaint Information obtained from Patient Bilateral LE Ulcers History of Present Illness  (HPI) The following HPI elements were documented for the patient's wound: Location: left anterior shin and left medial calf Quality: Patient reports experiencing a dull pain to affected area(s). Severity: Patient states wound are getting worse. Duration: Patient has had the wound for > 3 months prior to seeking treatment at the wound center Timing: Pain in wound is Intermittent (comes and goes Context: The wound appeared gradually over time Modifying Factors: Other treatment(s) tried include:had surgery on the left anterior shin for a hematoma which has been nonhealing for the last 8 months Associated Signs and Symptoms: Patient reports having increase swelling. 61 year old diabetic was last hemoglobin A1c was 7.2% has been reviewed today with 2 ulcerated areas in his left lower extremity which she's had for about 8 months and the one most recently was started as a blister for 2 weeks. he has a past medical history of atrial fibrillation, coronary artery disease, CHF, chronic venous stasis dermatitis, diabetes mellitus, hypertension, sleep apnea and is status post kidney transplant in 2012. He is a former smoker and quit cigars in 1996. In the remote past he's had some arterial and venous duplex studies done but does not recall the results no was ever treated for venous insufficiency. 06/10/2016 -- -- had a office visit at [facility] seen by the physician s assistant Marcelle Overlie. Patient underwent an ABI which showed medial calcification, normal tibial artery Doppler waveform, PPG waveforms and toe brachial indices suggest normal arterial perfusion to bilateral lower extremities. right TBI was 0.97 and left was 0.84 Bilateral venous duplex was notable for venous reflux in the left greater saphenous vein and popliteal veins.. A left lower extremity endovenous laser ablation was recommended. 06/27/2016 -- I understand he spoke to his insurance company and there is a 3 month  waiting period with conservative therapy prior to authorizing surgical intervention. 07/12/16- he is here for follow-up evaluation of a left lower extremity ulcer. He did bring his compression stockings today. He is voicing no complaints or concerns Readmission: 04/16/2021 upon evaluation today patient appears for reevaluation here in the clinic he was last seen in April 2018. At that time he was having issues  with venous stasis ulcers and that again is what he is dealing with today as well. Fortunately I do not see any signs of active infection at this time which is great news and overall very pleased in that regard. Nonetheless he does have some areas of eschar which is going require debridement to clear away some of the necrotic debris so that we can hopefully get this healed as quickly as possible. That was discussed with the patient today as well. The patient does have diabetes mellitus type 2 for which she does have a hemoglobin A1c of 7.27 November 2020. He also has been on Keflex for what was felt to be cellulitis that seems to be doing better these wounds began on March 08, 2021. The patient also has a history of lymphedema, chronic venous insufficiency, and is a kidney transplant patient. 04/24/2021 upon evaluation today patient appears to be doing okay in regard to his wounds. Fortunately there does not appear to be any evidence of active infection locally nor systemically at this time which is good news. Nonetheless he was not able to keep the compression wraps on I think this is going be a little bit more problem he tells me his anxiety was to the point he just was not able to do it. I do understand but at the same time that is good to make things somewhat difficult to be honest. 04/30/2021 upon evaluation today patient appears to be doing well with regard to his wounds on his legs. Fortunately I do not see any signs of active infection locally or systemically at this time which is great  news. No fevers, chills, nausea, vomiting, or diarrhea. Objective [name]. ([id number]) Constitutional Well-nourished and well-hydrated in no acute distress. Vitals Time Taken: 8:34 AM, Height: 70 in, Weight: 270 lbs, BMI: 38.7, Temperature: 97.7 F, Pulse: 73 bpm, Respiratory Rate: 16 breaths/min, Blood Pressure: 169/75 mmHg. Respiratory normal breathing without difficulty. Psychiatric this patient is able to make decisions and demonstrates good insight into disease process. Alert and Oriented x 3. pleasant and cooperative. General Notes: Upon inspection patient's wound bed actually showed signs of good granulation utilization at this point. Fortunately I do not see any evidence of infection locally or systemically which is great news and overall I am very pleased with where we stand today. Integumentary (Hair, Skin) Wound #3 status is Open. Original cause of wound was Blister. The date acquired was: 03/08/2021. The wound has been in treatment 2 weeks. The wound is located on the Left,Anterior Lower Leg. The wound measures 1.5cm length x 0.5cm width x 0.1cm depth; 0.589cm^2 area and 0.059cm^3 volume. There is Fat Layer (Subcutaneous Tissue) exposed. There is no tunneling or undermining noted. There is a medium amount of serosanguineous drainage noted. There is small (1-33%) pink granulation within the wound bed. There is a large (67-100%) amount of necrotic tissue within the wound bed including Eschar and Adherent Slough. Wound #4 status is Open. Original cause of wound was Blister. The date acquired was: 03/08/2021. The wound has been in treatment 2 weeks. The wound is located on the Right,Medial,Anterior Lower Leg. The wound measures 1cm length x 1cm width x 0.1cm depth; 0.785cm^2 area and 0.079cm^3 volume. There is Fat Layer (Subcutaneous Tissue) exposed. There is no tunneling or undermining noted. There is a medium amount of serosanguineous drainage noted. There is large (67-100%)  red granulation within the wound bed. There is a small (1-33%) amount of necrotic tissue within the wound bed including Eschar and  Adherent Slough. Wound #5 status is Open. Original cause of wound was Blister. The date acquired was: 03/08/2021. The wound has been in treatment 2 weeks. The wound is located on the Right,Lateral,Anterior Lower Leg. The wound measures 1.2cm length x 1cm width x 0.1cm depth; 0.942cm^2 area and 0.094cm^3 volume. There is Fat Layer (Subcutaneous Tissue) exposed. There is no tunneling or undermining noted. There is a medium amount of serosanguineous drainage noted. There is medium (34-66%) red granulation within the wound bed. There is a medium (34-66%) amount of necrotic tissue within the wound bed including Eschar and Adherent Slough. Assessment Active Problems ICD-10 Type 2 diabetes mellitus with other skin ulcer Lymphedema, not elsewhere classified Non-pressure chronic ulcer of other part of left lower leg with fat layer exposed Non-pressure chronic ulcer of other part of right lower leg with fat layer exposed Chronic venous hypertension (idiopathic) with ulcer of left lower extremity Chronic venous hypertension (idiopathic) with ulcer of right lower extremity Kidney transplant status Plan Follow-up Appointments: Return Appointment in 1 week. Nurse Visit as needed Bathing/ Shower/ Hygiene: May shower; gently cleanse wound with antibacterial soap, rinse and pat dry prior to dressing wounds May shower with wound dressing protected with water repellent cover or cast protector. No tub bath. Anesthetic (Use 'Patient Medications' Section for Anesthetic Order Entry): Lidocaine applied to wound bed Edema Control - Lymphedema / Segmental Compressive Device / Other: Optional: One layer of unna paste to top of compression wrap (to act as an anchor). Patient to wear own compression stockings. Remove compression stockings every night before going to bed and put on every  morning when getting up. Elevate leg(s) parallel to the floor when sitting. DO YOUR BEST to sleep in the bed at night. DO NOT sleep in your recliner. Long hours of sitting in a recliner leads to swelling of the legs and/or potential wounds on your backside. Additional Orders / Instructions: [name] ([id number]) Follow Nutritious Diet and Increase Protein Intake - continue to monitor blood sugar Other: - Keep vascular appt for ABI on 05/02/21 WOUND #3: - Lower Leg Wound Laterality: Left, Anterior Cleanser: Soap and Water 3 x Per Week/15 Days Discharge Instructions: Gently cleanse wound with antibacterial soap, rinse and pat dry prior to dressing wounds Cleanser: Wound Cleanser 3 x Per Week/15 Days Discharge Instructions: Wash your hands with soap and water. Remove old dressing, discard into plastic bag and place into trash. Cleanse the wound with Wound Cleanser prior to applying a clean dressing using gauze sponges, not tissues or cotton balls. Do not scrub or use excessive force. Pat dry using gauze sponges, not tissue or cotton balls. Primary Dressing: IODOFLEX 0.9% Cadexomer Iodine Pad 3 x Per Week/15 Days Discharge Instructions: Apply Iodoflex to wound bed only as directed. Secondary Dressing: Zetuvit Plus Silicone Border Dressing 4x4 (in/in) (Generic) 3 x Per Week/15 Days WOUND #4: - Lower Leg Wound Laterality: Right, Medial, Anterior Cleanser: Byram Ancillary Kit - 15 Day Supply (Generic) 3 x Per Week/15 Days Discharge Instructions: Use supplies as instructed; Kit contains: (15) Saline Bullets; (15) 3x3 Gauze; 15 pr Gloves Cleanser: Soap and Water 3 x Per Week/15 Days Discharge Instructions: Gently cleanse wound with antibacterial soap, rinse and pat dry prior to dressing wounds Cleanser: Wound Cleanser 3 x Per Week/15 Days Discharge Instructions: Wash your hands with soap and water. Remove old dressing, discard into plastic bag and place into trash. Cleanse the wound with  Wound Cleanser prior to applying a clean dressing using gauze sponges, not  tissues or cotton balls. Do not scrub or use excessive force. Pat dry using gauze sponges, not tissue or cotton balls. Primary Dressing: IODOFLEX 0.9% Cadexomer Iodine Pad 3 x Per Week/15 Days Discharge Instructions: Apply Iodoflex to wound bed only as directed. Secondary Dressing: Zetuvit Plus Silicone Border Dressing 4x4 (in/in) (Generic) 3 x Per Week/15 Days WOUND #5: - Lower Leg Wound Laterality: Right, Lateral, Anterior Cleanser: Soap and Water 3 x Per Week/15 Days Discharge Instructions: Gently cleanse wound with antibacterial soap, rinse and pat dry prior to dressing wounds Cleanser: Wound Cleanser 3 x Per Week/15 Days Discharge Instructions: Wash your hands with soap and water. Remove old dressing, discard into plastic bag and place into trash. Cleanse the wound with Wound Cleanser prior to applying a clean dressing using gauze sponges, not tissues or cotton balls. Do not scrub or use excessive force. Pat dry using gauze sponges, not tissue or cotton balls. Primary Dressing: IODOFLEX 0.9% Cadexomer Iodine Pad 3 x Per Week/15 Days Discharge Instructions: Apply Iodoflex to wound bed only as directed. Secondary Dressing: Zetuvit Plus Silicone Border Dressing 4x4 (in/in) (Generic) 3 x Per Week/15 Days 1. I do believe that the Iodoflex is doing a great job in my opinion currently is can be that we continue with the plan as far as treatment is concerned. 2. I am also going to recommend he continue to use compression socks these do seem to be helping and I think he is doing just fine with those. We will see patient back for reevaluation in 1 week here in the clinic. If anything worsens or changes patient will contact our office for additional recommendations. Electronic Signature(s) Signed: 04/30/2021 9:09:46 AM By: Worthy Keeler PA-C Entered By: Worthy Keeler on 04/30/2021 09:09:46 [name]  ([id number]) -------------------------------------------------------------------------------- SuperBill Details Patient Name: [name]. Date of Service: 04/30/2021 Medical Record Number: [id number] Patient Account Number: [id number] Date of Birth/Sex: 02-Nov-1960 (61 y.o. M) Treating RN: Donnamarie Poag Primary Care Provider: [name] Other Clinician: Referring Provider: [name] Treating Provider/Extender: Skipper Cliche in Treatment: 2 Diagnosis Coding ICD-10 Codes Code Description [phone] Type 2 diabetes mellitus with other skin ulcer I89.0 Lymphedema, not elsewhere classified L97.822 Non-pressure chronic ulcer of other part of left lower leg with fat layer exposed L97.812 Non-pressure chronic ulcer of other part of right lower leg with fat layer exposed I87.312 Chronic venous hypertension (idiopathic) with ulcer of left lower extremity I87.311 Chronic venous hypertension (idiopathic) with ulcer of right lower extremity Z94.0 Kidney transplant status Facility Procedures CPT4 Code: [id number] Description: 99213 - WOUND CARE VISIT-LEV 3 EST PT Modifier: Quantity: 1 Physician Procedures CPT4 Code: [id number] Description: 99213 - WC PHYS LEVEL 3 - EST PT Modifier: Quantity: 1 CPT4 Code: Description: ICD-10 Diagnosis Description E11.622 Type 2 diabetes mellitus with other skin ulcer I89.0 Lymphedema, not elsewhere classified L97.822 Non-pressure chronic ulcer of other part of left lower leg with fat lay L97.812 Non-pressure chronic ulcer  of other part of right lower leg with fat la Modifier: er exposed yer exposed Quantity: Electronic Signature(s) Signed: 04/30/2021 9:10:00 AM By: Worthy Keeler PA-C Entered By: Worthy Keeler on 04/30/2021 09:10:00

## 2021-04-30 NOTE — Progress Notes (Signed)
HUTCHINSON, ISENBERG ([id number]) Visit Report for 04/30/2021 Arrival Information Details Patient Name: [name], [name]. Date of Service: 04/30/2021 8:30 AM Medical Record Number: [id number] Patient Account Number: [id number] Date of Birth/Sex: 1960/06/19 (61 y.o. M) Treating RN: Donnamarie Poag Primary Care Anaelle Dunton: [name] Other Clinician: Referring Emmauel Hallums: [name] Treating Hertha Gergen/Extender: Skipper Cliche in Treatment: 2 Visit Information History Since Last Visit Added or deleted any medications: No Patient Arrived: Ambulatory Had a fall or experienced change in No Arrival Time: 08:31 activities of daily living that may affect Accompanied By: self risk of falls: Transfer Assistance: None Hospitalized since last visit: No Patient Identification Verified: Yes Has Dressing in Place as Prescribed: Yes Secondary Verification Process Completed: Yes Pain Present Now: No Patient Requires Transmission-Based No Precautions: Patient Has Alerts: Yes Patient Alerts: Patient on Blood Thinner DIABETIC Cisco left leg 04/16/21 ELIQUIS Electronic Signature(s) Signed: 04/30/2021 5:00:48 PM By: Donnamarie Poag Entered By: Donnamarie Poag on 04/30/2021 08:34:23 Valverde, Dorothea Glassman ([id number]) -------------------------------------------------------------------------------- Clinic Level of Care Assessment Details Patient Name: [name]. Date of Service: 04/30/2021 8:30 AM Medical Record Number: [id number] Patient Account Number: [id number] Date of Birth/Sex: 1960-06-16 (61 y.o. M) Treating RN: Donnamarie Poag Primary Care Shelley Pooley: [name] Other Clinician: Referring Maor Meckel: [name] Treating Tamer Baughman/Extender: Skipper Cliche in Treatment: 2 Clinic Level of Care Assessment Items TOOL 4 Quantity Score _0  - Use when only an EandM is performed on FOLLOW-UP visit 0 ASSESSMENTS - Nursing Assessment / Reassessment _1  - Reassessment of Co-morbidities (includes updates in patient  status) 0 _2  - 0 Reassessment of Adherence to Treatment Plan ASSESSMENTS - Wound and Skin Assessment / Reassessment _3  - Simple Wound Assessment / Reassessment - one wound 0 X- 3 5 Complex Wound Assessment / Reassessment - multiple wounds _4  - 0 Dermatologic / Skin Assessment (not related to wound area) ASSESSMENTS - Focused Assessment _5  - Circumferential Edema Measurements - multi extremities 0 _6  - 0 Nutritional Assessment / Counseling / Intervention _7  - 0 Lower Extremity Assessment (monofilament, tuning fork, pulses) _8  - 0 Peripheral Arterial Disease Assessment (using hand held doppler) ASSESSMENTS - Ostomy and/or Continence Assessment and Care _9  - Incontinence Assessment and Management 0 _10  - 0 Ostomy Care Assessment and Management (repouching, etc.) PROCESS - Coordination of Care X - Simple Patient / Family Education for ongoing care 1 15 _11  - 0 Complex (extensive) Patient / Family Education for ongoing care _12  - 0 Staff obtains Programmer, systems, Records, Test Results / Process Orders _13  - 0 Staff telephones HHA, Nursing Homes / Clarify orders / etc _14  - 0 Routine Transfer to another Facility (non-emergent condition) _15  - 0 Routine Hospital Admission (non-emergent condition) _16  - 0 New Admissions / Biomedical engineer / Ordering NPWT, Apligraf, etc. _17  - 0 Emergency Hospital Admission (emergent condition) X- 1 10 Simple Discharge Coordination _18  - 0 Complex (extensive) Discharge Coordination PROCESS - Special Needs _19  - Pediatric / Minor Patient Management 0 _20  - 0 Isolation Patient Management _21  - 0 Hearing / Language / Visual special needs _22  - 0 Assessment of Community assistance (transportation, D/C planning, etc.) _23  - 0 Additional assistance / Altered mentation _24  - 0 Support Surface(s) Assessment (bed, cushion, seat, etc.) INTERVENTIONS - Wound Cleansing / Measurement Pesqueira, Jadd F. ([id number]) _25  - 0 Simple Wound Cleansing - one wound X- 3  5 Complex Wound Cleansing - multiple wounds X- 1 5 Wound Imaging (photographs - any number of wounds) _26  - 0 Wound Tracing (instead of photographs) _27  - 0 Simple Wound  Measurement - one wound X- 3 5 Complex Wound Measurement - multiple wounds INTERVENTIONS - Wound Dressings X - Small Wound Dressing one or multiple wounds 3 10 _0  - 0 Medium Wound Dressing one or multiple wounds _1  - 0 Large Wound Dressing one or multiple wounds X- 1 5 Application of Medications - topical <VZDGLOVFIEPPIRJJ>_8<\/[id number]>_2  - 0 Application of Medications - injection INTERVENTIONS - Miscellaneous _3  - External ear exam 0 _4  - 0 Specimen Collection (cultures, biopsies, blood, body fluids, etc.) _5  - 0 Specimen(s) / Culture(s) sent or taken to Lab for analysis _6  - 0 Patient Transfer (multiple staff / Civil Service fast streamer / Similar devices) _7  - 0 Simple Staple / Suture removal (25 or less) _8  - 0 Complex Staple / Suture removal (26 or more) _9  - 0 Hypo / Hyperglycemic Management (close monitor of Blood Glucose) _10  - 0 Ankle / Brachial Index (ABI) - do not check if billed separately X- 1 5 Vital Signs Has the patient been seen at the hospital within the last three years: Yes Total Score: 115 Level Of Care: New/Established - Level 3 Electronic Signature(s) Signed: 04/30/2021 5:00:48 PM By: Donnamarie Poag Entered By: Donnamarie Poag on 04/30/2021 08:57:24 Fennel, Dorothea Glassman ([id number]) -------------------------------------------------------------------------------- Encounter Discharge Information Details Patient Name: [name]. Date of Service: 04/30/2021 8:30 AM Medical Record Number: [id number] Patient Account Number: [id number] Date of Birth/Sex: 1960/09/06 (61 y.o. M) Treating RN: Donnamarie Poag Primary Care Maleke Feria: [name] Other Clinician: Referring Sharlee Rufino: [name] Treating Kalecia Hartney/Extender: Skipper Cliche in Treatment: 2 Encounter Discharge Information Items Discharge Condition: Stable Ambulatory Status:  Ambulatory Discharge Destination: Home Transportation: Private Auto Accompanied By: self Schedule Follow-up Appointment: Yes Clinical Summary of Care: Electronic Signature(s) Signed: 04/30/2021 5:00:48 PM By: Donnamarie Poag Entered By: Donnamarie Poag on 04/30/2021 09:03:33 Buchbinder, Dorothea Glassman ([id number]) -------------------------------------------------------------------------------- Lower Extremity Assessment Details Patient Name: [name] Date of Service: 04/30/2021 8:30 AM Medical Record Number: [id number] Patient Account Number: [id number] Date of Birth/Sex: [birth year] (61 y.o. M) Treating RN: Donnamarie Poag Primary Care Olyvia Gopal: [name] Other Clinician: Referring Tariana Moldovan: [name] Treating Elianne Gubser/Extender: Skipper Cliche in Treatment: 2 Edema Assessment Assessed: Shirlyn Goltz: Yes] Patrice Paradise: Yes] [Left: Edema] [Right: :] Calf Left: Right: Point of Measurement: 30 cm From Medial Instep 38 cm 39 cm Ankle Left: Right: Point of Measurement: 10 cm From Medial Instep 23.5 cm 24 cm Knee To Floor Left: Right: From Medial Instep 40 cm 40 cm Vascular Assessment Pulses: Dorsalis Pedis Palpable: [Left:Yes] [Right:Yes] Electronic Signature(s) Signed: 04/30/2021 5:00:48 PM By: Donnamarie Poag Entered By: Donnamarie Poag on 04/30/2021 08:44:49 Pridgen, Youcef F. ([id number]) -------------------------------------------------------------------------------- Multi Wound Chart Details Patient Name: [name] Date of Service: 04/30/2021 8:30 AM Medical Record Number: [id number] Patient Account Number: [id number] Date of Birth/Sex: Oct 17, 1960 (61 y.o. M) Treating RN: Donnamarie Poag Primary Care Crystian Frith: [name] Other Clinician: Referring Jader Desai: [name] Treating Patte Winkel/Extender: Skipper Cliche in Treatment: 2 Vital Signs Height(in): 70 Pulse(bpm): 93 Weight(lbs): 270 Blood Pressure(mmHg): 169/75 Body Mass Index(BMI): 38.7 Temperature(F): 97.7 Respiratory  Rate(breaths/min): 16 Photos: Wound Location: Left, Anterior Lower Leg Right, Medial, Anterior Lower Leg Right, Lateral, Anterior Lower Leg Wounding Event: Blister Blister Blister Primary Etiology: Venous Leg Ulcer Venous Leg Ulcer Venous Leg Ulcer Comorbid History: Cataracts, Lymphedema, Asthma, Cataracts, Lymphedema, Asthma, Cataracts, Lymphedema, Asthma, Sleep Apnea, Arrhythmia, Sleep Apnea, Arrhythmia, Sleep Apnea, Arrhythmia, Congestive Heart Failure, Coronary Congestive Heart Failure, Coronary Congestive Heart Failure, Coronary Artery Disease, Hypertension, Type Artery Disease, Hypertension, Type Artery Disease, Hypertension, Type II Diabetes, End  Stage Renal II Diabetes, End Stage Renal II Diabetes, End Stage Renal Disease, Gout, Osteoarthritis, Disease, Gout, Osteoarthritis, Disease, Gout, Osteoarthritis, Neuropathy Neuropathy Neuropathy Date Acquired: 03/08/2021 03/08/2021 03/08/2021 Weeks of Treatment: _0 Wound Status: Open Open Open Wound Recurrence: No No No Measurements L x W x D (cm) 1.5x0.5x0.1 1x1x0.1 1.2x1x0.1 Area (cm) : 0.589 0.785 0.942 Volume (cm) : 0.059 0.079 0.094 % Reduction in Area: 87.70% 64.90% -149.90% % Reduction in Volume: 87.70% 64.70% -147.40% Classification: Full Thickness Without Exposed Full Thickness Without Exposed Full Thickness Without Exposed Support Structures Support Structures Support Structures Exudate Amount: Medium Medium Medium Exudate Type: Serosanguineous Serosanguineous Serosanguineous Exudate Color: red, brown red, brown red, brown Granulation Amount: Small (1-33%) Large (67-100%) Medium (34-66%) Granulation Quality: Pink Red Red Necrotic Amount: Large (67-100%) Small (1-33%) Medium (34-66%) Necrotic Tissue: Eschar, Adherent Slough Eschar, Adherent [place] Exposed Structures: Fat Layer (Subcutaneous Tissue): Fat Layer (Subcutaneous Tissue): Fat Layer (Subcutaneous Tissue): Yes Yes Yes Fascia: No Fascia:  No Fascia: No Tendon: No Tendon: No Tendon: No Muscle: No Muscle: No Muscle: No Joint: No Joint: No Joint: No Bone: No Bone: No Bone: No Treatment Notes Electronic Signature(s) Signed: 04/30/2021 5:00:48 PM By: Hassan Rowan ([id number]) Entered By: Donnamarie Poag on 04/30/2021 08:45:15 Horen, Dorothea Glassman ([id number]) -------------------------------------------------------------------------------- [place] Details Patient Name: RASHEEM, FIGIEL. Date of Service: 04/30/2021 8:30 AM Medical Record Number: [id number] Patient Account Number: [id number] Date of Birth/Sex: 11-05-1960 (61 y.o. M) Treating RN: Donnamarie Poag Primary Care Amariyah Bazar: [name] Other Clinician: Referring Vinayak Bobier: [name] Treating Lexey Fletes/Extender: Skipper Cliche in Treatment: 2 Active Inactive Wound/Skin Impairment Nursing Diagnoses: Impaired tissue integrity Knowledge deficit related to smoking impact on wound healing Knowledge deficit related to ulceration/compromised skin integrity Goals: Patient/caregiver will verbalize understanding of skin care regimen Date Initiated: 04/16/2021 Date Inactivated: 04/24/2021 Target Resolution Date: 04/27/2021 Goal Status: Met Ulcer/skin breakdown will have a volume reduction of 30% by week 4 Date Initiated: 04/16/2021 Date Inactivated: 04/30/2021 Target Resolution Date: 05/14/2021 Goal Status: Met Ulcer/skin breakdown will have a volume reduction of 50% by week 8 Date Initiated: 04/16/2021 Target Resolution Date: 06/11/2021 Goal Status: Active Ulcer/skin breakdown will have a volume reduction of 80% by week 12 Date Initiated: 04/16/2021 Target Resolution Date: 07/09/2021 Goal Status: Active Ulcer/skin breakdown will heal within 14 weeks Date Initiated: 04/16/2021 Target Resolution Date: 07/23/2021 Goal Status: Active Interventions: Assess patient/caregiver ability to obtain necessary supplies Assess patient/caregiver  ability to perform ulcer/skin care regimen upon admission and as needed Assess ulceration(s) every visit Notes: Electronic Signature(s) Signed: 04/30/2021 5:00:48 PM By: Donnamarie Poag Entered By: Donnamarie Poag on 04/30/2021 08:45:03 Vanderschaaf, Dorothea Glassman ([id number]) -------------------------------------------------------------------------------- Pain Assessment Details Patient Name: [name]. Date of Service: 04/30/2021 8:30 AM Medical Record Number: [id number] Patient Account Number: [id number] Date of Birth/Sex: 1960-12-24 (61 y.o. M) Treating RN: Donnamarie Poag Primary Care Rumor Sun: [name] Other Clinician: Referring Grayce Budden: [name] Treating Shiryl Ruddy/Extender: Skipper Cliche in Treatment: 2 Active Problems Location of Pain Severity and Description of Pain Patient Has Paino No Site Locations Rate the pain. Current Pain Level: 0 Pain Management and Medication Current Pain Management: Electronic Signature(s) Signed: 04/30/2021 5:00:48 PM By: Donnamarie Poag Entered By: Donnamarie Poag on 04/30/2021 08:35:14 Haden, Dorothea Glassman ([id number]) -------------------------------------------------------------------------------- Patient/Caregiver Education Details Patient Name: [name]. Date of Service: 04/30/2021 8:30 AM Medical Record Number: [id number] Patient Account Number: [id number] Date of Birth/Gender: [birth year] (61 y.o. M) Treating RN: Donnamarie Poag Primary Care Physician: [name]  Other Clinician: Referring Physician: [name] Treating Physician/Extender: Skipper Cliche in Treatment: 2 Education Assessment Education Provided To: Patient Education Topics Provided Basic Hygiene: Venous: Wound/Skin Impairment: Electronic Signature(s) Signed: 04/30/2021 5:00:48 PM By: Donnamarie Poag Entered By: Donnamarie Poag on 04/30/2021 08:57:44 Catherman, Dorothea Glassman ([id number]) -------------------------------------------------------------------------------- Wound Assessment  Details Patient Name: [name] Date of Service: 04/30/2021 8:30 AM Medical Record Number: [id number] Patient Account Number: [id number] Date of Birth/Sex: Feb 01, 1961 (61 y.o. M) Treating RN: Donnamarie Poag Primary Care Tunis Gentle: [name] Other Clinician: Referring Mckenzie Toruno: [name] Treating Arlisa Leclere/Extender: Skipper Cliche in Treatment: 2 Wound Status Wound Number: 3 Primary Venous Leg Ulcer Etiology: Wound Location: Left, Anterior Lower Leg Wound Open Wounding Event: Blister Status: Date Acquired: 03/08/2021 Comorbid Cataracts, Lymphedema, Asthma, Sleep Apnea, Weeks Of Treatment: 2 History: Arrhythmia, Congestive Heart Failure, Coronary Artery Clustered Wound: No Disease, Hypertension, Type II Diabetes, End Stage Renal Disease, Gout, Osteoarthritis, Neuropathy Photos Wound Measurements Length: (cm) 1.5 Width: (cm) 0.5 Depth: (cm) 0.1 Area: (cm) 0.589 Volume: (cm) 0.059 % Reduction in Area: 87.7% % Reduction in Volume: 87.7% Tunneling: No Undermining: No Wound Description Classification: Full Thickness Without Exposed Support Structures Exudate Amount: Medium Exudate Type: Serosanguineous Exudate Color: red, brown Foul Odor After Cleansing: No Slough/Fibrino Yes Wound Bed Granulation Amount: Small (1-33%) Exposed Structure Granulation Quality: Pink Fascia Exposed: No Necrotic Amount: Large (67-100%) Fat Layer (Subcutaneous Tissue) Exposed: Yes Necrotic Quality: Eschar, Adherent Slough Tendon Exposed: No Muscle Exposed: No Joint Exposed: No Bone Exposed: No Treatment Notes Wound #3 (Lower Leg) Wound Laterality: Left, Anterior Cleanser Soap and Water Discharge Instruction: Gently cleanse wound with antibacterial soap, rinse and pat dry prior to dressing wounds Wound Cleanser VUONG, MUSA. ([id number]) Discharge Instruction: Wash your hands with soap and water. Remove old dressing, discard into plastic bag and place into trash. Cleanse  the wound with Wound Cleanser prior to applying a clean dressing using gauze sponges, not tissues or cotton balls. Do not scrub or use excessive force. Pat dry using gauze sponges, not tissue or cotton balls. Peri-Wound Care Topical Primary Dressing IODOFLEX 0.9% Cadexomer Iodine Pad Discharge Instruction: Apply Iodoflex to wound bed only as directed. Secondary Dressing Zetuvit Plus Silicone Border Dressing 4x4 (in/in) Secured With Compression Wrap Compression Stockings Add-Ons Electronic Signature(s) Signed: 04/30/2021 5:00:48 PM By: Donnamarie Poag Entered By: Donnamarie Poag on 04/30/2021 08:41:52 Palmero, Jaelynn F. ([id number]) -------------------------------------------------------------------------------- Wound Assessment Details Patient Name: [name] Date of Service: 04/30/2021 8:30 AM Medical Record Number: [id number] Patient Account Number: [id number] Date of Birth/Sex: 05-04-1960 (61 y.o. M) Treating RN: Donnamarie Poag Primary Care Ociel Retherford: [name] Other Clinician: Referring Naw Lasala: [name] Treating Riven Beebe/Extender: Skipper Cliche in Treatment: 2 Wound Status Wound Number: 4 Primary Venous Leg Ulcer Etiology: Wound Location: Right, Medial, Anterior Lower Leg Wound Open Wounding Event: Blister Status: Date Acquired: 03/08/2021 Comorbid Cataracts, Lymphedema, Asthma, Sleep Apnea, Weeks Of Treatment: 2 History: Arrhythmia, Congestive Heart Failure, Coronary Artery Clustered Wound: No Disease, Hypertension, Type II Diabetes, End Stage Renal Disease, Gout, Osteoarthritis, Neuropathy Photos Wound Measurements Length: (cm) 1 Width: (cm) 1 Depth: (cm) 0.1 Area: (cm) 0.785 Volume: (cm) 0.079 % Reduction in Area: 64.9% % Reduction in Volume: 64.7% Tunneling: No Undermining: No Wound Description Classification: Full Thickness Without Exposed Support Structures Exudate Amount: Medium Exudate Type: Serosanguineous Exudate Color: red, brown Foul  Odor After Cleansing: No Slough/Fibrino Yes Wound Bed Granulation Amount: Large (67-100%) Exposed Structure Granulation Quality: Red Fascia Exposed: No Necrotic Amount: Small (1-33%) Fat Layer (Subcutaneous Tissue)  Exposed: Yes Necrotic Quality: Eschar, Adherent Slough Tendon Exposed: No Muscle Exposed: No Joint Exposed: No Bone Exposed: No Treatment Notes Wound #4 (Lower Leg) Wound Laterality: Right, Medial, Anterior Cleanser [place] Ancillary Kit - 15 Day Supply Discharge Instruction: Use supplies as instructed; Kit contains: (15) Saline Bullets; (15) 3x3 Gauze; 15 pr Gloves Soap and [address] CALLUM, WOLF F. ([id number]) Discharge Instruction: Gently cleanse wound with antibacterial soap, rinse and pat dry prior to dressing wounds Wound Cleanser Discharge Instruction: Wash your hands with soap and water. Remove old dressing, discard into plastic bag and place into trash. Cleanse the wound with Wound Cleanser prior to applying a clean dressing using gauze sponges, not tissues or cotton balls. Do not scrub or use excessive force. Pat dry using gauze sponges, not tissue or cotton balls. Peri-Wound Care Topical Primary Dressing IODOFLEX 0.9% Cadexomer Iodine Pad Discharge Instruction: Apply Iodoflex to wound bed only as directed. Secondary Dressing Zetuvit Plus Silicone Border Dressing 4x4 (in/in) Secured With Compression Wrap Compression Stockings Add-Ons Electronic Signature(s) Signed: 04/30/2021 5:00:48 PM By: Donnamarie Poag Entered By: Donnamarie Poag on 04/30/2021 08:42:48 Colberg, Ilyaas F. ([id number]) -------------------------------------------------------------------------------- Wound Assessment Details Patient Name: [name] Date of Service: 04/30/2021 8:30 AM Medical Record Number: [id number] Patient Account Number: [id number] Date of Birth/Sex: [birth year] (61 y.o. M) Treating RN: Donnamarie Poag Primary Care Pasha Gadison: [name] Other Clinician: Referring Aahan Marques:  [name] Treating Calirose Mccance/Extender: Skipper Cliche in Treatment: 2 Wound Status Wound Number: 5 Primary Venous Leg Ulcer Etiology: Wound Location: Right, Lateral, Anterior Lower Leg Wound Open Wounding Event: Blister Status: Date Acquired: 03/08/2021 Comorbid Cataracts, Lymphedema, Asthma, Sleep Apnea, Weeks Of Treatment: 2 History: Arrhythmia, Congestive Heart Failure, Coronary Artery Clustered Wound: No Disease, Hypertension, Type II Diabetes, End Stage Renal Disease, Gout, Osteoarthritis, Neuropathy Photos Wound Measurements Length: (cm) 1.2 Width: (cm) 1 Depth: (cm) 0.1 Area: (cm) 0.942 Volume: (cm) 0.094 % Reduction in Area: -149.9% % Reduction in Volume: -147.4% Tunneling: No Undermining: No Wound Description Classification: Full Thickness Without Exposed Support Structures Exudate Amount: Medium Exudate Type: Serosanguineous Exudate Color: red, brown Foul Odor After Cleansing: No Slough/Fibrino Yes Wound Bed Granulation Amount: Medium (34-66%) Exposed Structure Granulation Quality: Red Fascia Exposed: No Necrotic Amount: Medium (34-66%) Fat Layer (Subcutaneous Tissue) Exposed: Yes Necrotic Quality: Eschar, Adherent Slough Tendon Exposed: No Muscle Exposed: No Joint Exposed: No Bone Exposed: No Treatment Notes Wound #5 (Lower Leg) Wound Laterality: Right, Lateral, Anterior Cleanser Soap and Water Discharge Instruction: Gently cleanse wound with antibacterial soap, rinse and pat dry prior to dressing wounds Wound Cleanser CANE, DUBRAY. ([id number]) Discharge Instruction: Wash your hands with soap and water. Remove old dressing, discard into plastic bag and place into trash. Cleanse the wound with Wound Cleanser prior to applying a clean dressing using gauze sponges, not tissues or cotton balls. Do not scrub or use excessive force. Pat dry using gauze sponges, not tissue or cotton balls. Peri-Wound Care Topical Primary Dressing IODOFLEX  0.9% Cadexomer Iodine Pad Discharge Instruction: Apply Iodoflex to wound bed only as directed. Secondary Dressing Zetuvit Plus Silicone Border Dressing 4x4 (in/in) Secured With Compression Wrap Compression Stockings Add-Ons Electronic Signature(s) Signed: 04/30/2021 5:00:48 PM By: Donnamarie Poag Entered By: Donnamarie Poag on 04/30/2021 08:43:43 Parke, Laquentin F. ([id number]) -------------------------------------------------------------------------------- [place] Details Patient Name: [name]. Date of Service: 04/30/2021 8:30 AM Medical Record Number: [id number] Patient Account Number: [id number] Date of Birth/Sex: 11-Oct-1960 (61 y.o. M) Treating RN: Donnamarie Poag Primary Care Cassondra Stachowski: [name] Other Clinician: Referring Duong Haydel: [name] Treating  Kaimana Lurz/Extender: Jeri Cos Weeks in Treatment: 2 Vital Signs Time Taken: 08:34 Temperature (F): 97.7 Height (in): 70 Pulse (bpm): 73 Weight (lbs): 270 Respiratory Rate (breaths/min): 16 Body Mass Index (BMI): 38.7 Blood Pressure (mmHg): 169/75 Reference Range: 80 - 120 mg / dl Electronic Signature(s) Signed: 04/30/2021 5:00:48 PM By: Donnamarie Poag Entered ByDonnamarie Poag on 04/30/2021 08:34:58

## 2021-05-01 ENCOUNTER — Other Ambulatory Visit: Payer: Self-pay

## 2021-05-01 ENCOUNTER — Telehealth: Payer: Self-pay

## 2021-05-01 DIAGNOSIS — D2272 Melanocytic nevi of left lower limb, including hip: Secondary | ICD-10-CM | POA: Diagnosis not present

## 2021-05-01 DIAGNOSIS — D2271 Melanocytic nevi of right lower limb, including hip: Secondary | ICD-10-CM | POA: Diagnosis not present

## 2021-05-01 DIAGNOSIS — D2261 Melanocytic nevi of right upper limb, including shoulder: Secondary | ICD-10-CM | POA: Diagnosis not present

## 2021-05-01 DIAGNOSIS — L821 Other seborrheic keratosis: Secondary | ICD-10-CM | POA: Diagnosis not present

## 2021-05-01 DIAGNOSIS — D225 Melanocytic nevi of trunk: Secondary | ICD-10-CM | POA: Diagnosis not present

## 2021-05-01 DIAGNOSIS — D2262 Melanocytic nevi of left upper limb, including shoulder: Secondary | ICD-10-CM | POA: Diagnosis not present

## 2021-05-01 NOTE — Telephone Encounter (Signed)
The request has been approved.   The authorization is effective for a maximum of 12 fills from 05/01/2021 to 04/30/2022, as long as the member is enrolled in their current health plan.   The request was approved with a quantity restriction.   This has been approved for a max daily dosage of 2.   A written notification letter will follow with additional details.

## 2021-05-01 NOTE — Telephone Encounter (Signed)
PA started through [employer] Key: [id number] PA Case ID: [id number]  Rx #: [id number]  Awaiting decision.

## 2021-05-02 ENCOUNTER — Other Ambulatory Visit: Payer: Self-pay

## 2021-05-02 ENCOUNTER — Encounter (INDEPENDENT_AMBULATORY_CARE_PROVIDER_SITE_OTHER): Payer: 59

## 2021-05-02 ENCOUNTER — Ambulatory Visit (INDEPENDENT_AMBULATORY_CARE_PROVIDER_SITE_OTHER): Payer: 59

## 2021-05-02 DIAGNOSIS — L97812 Non-pressure chronic ulcer of other part of right lower leg with fat layer exposed: Secondary | ICD-10-CM | POA: Diagnosis not present

## 2021-05-02 DIAGNOSIS — L97822 Non-pressure chronic ulcer of other part of left lower leg with fat layer exposed: Secondary | ICD-10-CM

## 2021-05-07 ENCOUNTER — Encounter: Payer: 59 | Admitting: Physician Assistant

## 2021-05-07 ENCOUNTER — Other Ambulatory Visit: Payer: Self-pay

## 2021-05-07 ENCOUNTER — Telehealth: Payer: Self-pay | Admitting: *Deleted

## 2021-05-07 ENCOUNTER — Other Ambulatory Visit
Admission: RE | Admit: 2021-05-07 | Discharge: 2021-05-07 | Disposition: A | Discharge status: Home / Self Care | Payer: 59 | Attending: Nurse Practitioner | Admitting: Nurse Practitioner

## 2021-05-07 DIAGNOSIS — I2510 Atherosclerotic heart disease of native coronary artery without angina pectoris: Secondary | ICD-10-CM | POA: Insufficient documentation

## 2021-05-07 DIAGNOSIS — I4821 Permanent atrial fibrillation: Secondary | ICD-10-CM | POA: Diagnosis not present

## 2021-05-07 DIAGNOSIS — Z940 Kidney transplant status: Secondary | ICD-10-CM | POA: Diagnosis not present

## 2021-05-07 DIAGNOSIS — I872 Venous insufficiency (chronic) (peripheral): Secondary | ICD-10-CM | POA: Diagnosis not present

## 2021-05-07 DIAGNOSIS — E11622 Type 2 diabetes mellitus with other skin ulcer: Secondary | ICD-10-CM | POA: Diagnosis not present

## 2021-05-07 DIAGNOSIS — R079 Chest pain, unspecified: Secondary | ICD-10-CM | POA: Diagnosis not present

## 2021-05-07 DIAGNOSIS — N183 Chronic kidney disease, stage 3 unspecified: Secondary | ICD-10-CM | POA: Insufficient documentation

## 2021-05-07 DIAGNOSIS — E785 Hyperlipidemia, unspecified: Secondary | ICD-10-CM | POA: Insufficient documentation

## 2021-05-07 DIAGNOSIS — L97812 Non-pressure chronic ulcer of other part of right lower leg with fat layer exposed: Secondary | ICD-10-CM | POA: Diagnosis not present

## 2021-05-07 DIAGNOSIS — L97822 Non-pressure chronic ulcer of other part of left lower leg with fat layer exposed: Secondary | ICD-10-CM | POA: Diagnosis not present

## 2021-05-07 DIAGNOSIS — E876 Hypokalemia: Secondary | ICD-10-CM

## 2021-05-07 DIAGNOSIS — I482 Chronic atrial fibrillation, unspecified: Secondary | ICD-10-CM

## 2021-05-07 DIAGNOSIS — E1122 Type 2 diabetes mellitus with diabetic chronic kidney disease: Secondary | ICD-10-CM | POA: Diagnosis not present

## 2021-05-07 DIAGNOSIS — E1140 Type 2 diabetes mellitus with diabetic neuropathy, unspecified: Secondary | ICD-10-CM | POA: Diagnosis not present

## 2021-05-07 DIAGNOSIS — I5042 Chronic combined systolic (congestive) and diastolic (congestive) heart failure: Secondary | ICD-10-CM | POA: Insufficient documentation

## 2021-05-07 DIAGNOSIS — I87311 Chronic venous hypertension (idiopathic) with ulcer of right lower extremity: Secondary | ICD-10-CM | POA: Diagnosis not present

## 2021-05-07 DIAGNOSIS — I890 Lymphedema, not elsewhere classified: Secondary | ICD-10-CM | POA: Diagnosis not present

## 2021-05-07 DIAGNOSIS — G4730 Sleep apnea, unspecified: Secondary | ICD-10-CM | POA: Diagnosis not present

## 2021-05-07 LAB — LIPID PANEL
Cholesterol: 88 mg/dL (ref 0–200)
HDL: 36 mg/dL — ABNORMAL LOW (ref >40)
LDL Cholesterol: 31 mg/dL (ref 0–99)
Total CHOL/HDL Ratio: 2.4 RATIO
Triglycerides: 107 mg/dL (ref <150)
VLDL: 21 mg/dL (ref 0–40)

## 2021-05-07 LAB — BASIC METABOLIC PANEL
Anion gap: 9 (ref 5–15)
BUN: 56 mg/dL — ABNORMAL HIGH (ref 8–23)
CO2: 32 mmol/L (ref 22–32)
Calcium: 9 mg/dL (ref 8.9–10.3)
Chloride: 99 mmol/L (ref 98–111)
Creatinine, Ser: 1.68 mg/dL — ABNORMAL HIGH (ref 0.61–1.24)
GFR, Estimated: 46 mL/min — ABNORMAL LOW (ref >60)
Glucose, Bld: 168 mg/dL — ABNORMAL HIGH (ref 70–99)
Potassium: 3 mmol/L — ABNORMAL LOW (ref 3.5–5.1)
Sodium: 140 mmol/L (ref 135–145)

## 2021-05-07 NOTE — Telephone Encounter (Signed)
-----  Message from Theora Gianotti, NP sent at 05/07/2021 12:10 PM EST ----- Cholesterol numbers are at goal.  Kidney function is stable.  Potassium is very low @ 3.0.  Please review his current potassium dose with him.  Our records indicate that he is using Micro-K 8 meq x 9 capsules twice daily.  If that method and dose are accurate, he needs to increase to 12 capsules twice daily w/ f/u K later this week (Thurs or Fri).  If he's interested in reducing the number of capsules he's having to take, we could send in a larger meq formulation (50mq 3 tabs, 3x/day).

## 2021-05-07 NOTE — Telephone Encounter (Signed)
Reviewed results and recommendations with patient and he was only taking 2 tablets once a day of his current dose. Reviewed instructions and recommended he go back to 9 tablets twice a day and we will recheck his potassium level on Friday. He prefers to have it done at the [facility]. He verbalized understanding of instructions with no further questions at this time. Orders entered and provider updated on dosing. Patient aware we should get results back on same day.

## 2021-05-07 NOTE — Progress Notes (Signed)
NIAM, NEPOMUCENO ([id number]) Visit Report for 05/07/2021 Arrival Information Details Patient Name: [name], [name]. Date of Service: 05/07/2021 8:30 AM Medical Record Number: [id number] Patient Account Number: [id number] Date of Birth/Sex: 1960-12-02 (61 y.o. M) Treating RN: Donnamarie Poag Primary Care : [name] Other Clinician: Referring : [name] Treating /Extender: Skipper Cliche in Treatment: 3 Visit Information History Since Last Visit Added or deleted any medications: No Patient Arrived: Ambulatory Had a fall or experienced change in No Arrival Time: 08:40 activities of daily living that may affect Accompanied By: self risk of falls: Transfer Assistance: None Hospitalized since last visit: No Patient Identification Verified: Yes Has Dressing in Place as Prescribed: Yes Secondary Verification Process Completed: Yes Pain Present Now: No Patient Requires Transmission-Based No Precautions: Patient Has Alerts: Yes Patient Alerts: Patient on Blood Thinner DIABETIC [place] left leg 04/16/21 ELIQUIS AVVS 05/02/21 [place] ABI TBI L 0.90; R 1.03 Electronic Signature(s) Signed: 05/07/2021 3:22:57 PM By: Donnamarie Poag Entered By: Donnamarie Poag on 05/07/2021 09:04:35 Crigler, [name] ([id number]) -------------------------------------------------------------------------------- Encounter Discharge Information Details Patient Name: [name]. Date of Service: 05/07/2021 8:30 AM Medical Record Number: [id number] Patient Account Number: [id number] Date of Birth/Sex: Aug 13, 1960 (61 y.o. M) Treating RN: Donnamarie Poag Primary Care : [name] Other Clinician: Referring : [name] Treating /Extender: Skipper Cliche in Treatment: 3 Encounter Discharge Information Items Post Procedure Vitals Discharge Condition: Stable Temperature (F): 97.7 Ambulatory Status: Ambulatory Pulse (bpm): 68 Discharge Destination:  Home Respiratory Rate (breaths/min): 16 Transportation: Private Auto Blood Pressure (mmHg): 154/57 Accompanied By: self Schedule Follow-up Appointment: Yes Clinical Summary of Care: Electronic Signature(s) Signed: 05/07/2021 3:22:57 PM By: Donnamarie Poag Entered By: Donnamarie Poag on 05/07/2021 09:17:01 Nery, [name] ([id number]) -------------------------------------------------------------------------------- Lower Extremity Assessment Details Patient Name: Carl Speller F. Date of Service: 05/07/2021 8:30 AM Medical Record Number: [id number] Patient Account Number: [id number] Date of Birth/Sex: [birth year] (61 y.o. M) Treating RN: Donnamarie Poag Primary Care : [name] Other Clinician: Referring : [name] Treating /Extender: Skipper Cliche in Treatment: 3 Edema Assessment Assessed: [Left: No] Patrice [place]: No] [Left: Edema] [Right: :] Calf Left: Right: Point of Measurement: 30 cm From Medial Instep 38 cm 39 cm Ankle Left: Right: Point of Measurement: 10 cm From Medial Instep 24 cm 24 cm Knee To Floor Left: Right: From Medial Instep 40 cm 40 cm Vascular Assessment Pulses: Dorsalis Pedis Palpable: [Left:Yes] [Right:Yes] Electronic Signature(s) Signed: 05/07/2021 3:22:57 PM By: Donnamarie Poag Entered By: Donnamarie Poag on 05/07/2021 08:57:04 [name], [name] ([id number]) -------------------------------------------------------------------------------- Multi Wound Chart Details Patient Name: Carl Speller F. Date of Service: 05/07/2021 8:30 AM Medical Record Number: [id number] Patient Account Number: [id number] Date of Birth/Sex: 1961/01/02 (61 y.o. M) Treating RN: Donnamarie Poag Primary Care : [name] Other Clinician: Referring : [name] Treating /Extender: Skipper Cliche in Treatment: 3 Vital Signs Height(in): 70 Pulse(bpm): 21 Weight(lbs): 270 Blood Pressure(mmHg): 154/57 Body Mass Index(BMI):  38.7 Temperature(F): 97.7 Respiratory Rate(breaths/min): 16 Photos: Wound Location: Left, Anterior Lower Leg Right, Medial, Anterior Lower Leg Right, Lateral, Anterior Lower Leg Wounding Event: Blister Blister Blister Primary Etiology: Venous Leg Ulcer Venous Leg Ulcer Venous Leg Ulcer Comorbid History: Cataracts, Lymphedema, Asthma, Cataracts, Lymphedema, Asthma, Cataracts, Lymphedema, Asthma, Sleep Apnea, Arrhythmia, Sleep Apnea, Arrhythmia, Sleep Apnea, Arrhythmia, Congestive Heart Failure, Coronary Congestive Heart Failure, Coronary Congestive Heart Failure, Coronary Artery Disease, Hypertension, Type Artery Disease, Hypertension, Type Artery Disease, Hypertension, Type II Diabetes, End Stage Renal II Diabetes, End Stage Renal II Diabetes, End Stage Renal Disease, Gout,  Osteoarthritis, Disease, Gout, Osteoarthritis, Disease, Gout, Osteoarthritis, Neuropathy Neuropathy Neuropathy Date Acquired: 03/08/2021 03/08/2021 03/08/2021 Weeks of Treatment: _0 Wound Status: Open Open Healed - Epithelialized Wound Recurrence: No No No Measurements L x W x D (cm) 0.8x0.3x0.1 1x0.8x0.1 0x0x0 Area (cm) : 0.188 0.628 0 Volume (cm) : 0.019 0.063 0 % Reduction in Area: 96.10% 71.90% 100.00% % Reduction in Volume: 96.00% 71.90% 100.00% Classification: Full Thickness Without Exposed Full Thickness Without Exposed Full Thickness Without Exposed Support Structures Support Structures Support Structures Exudate Amount: Medium Medium None Present Exudate Type: Serosanguineous Serosanguineous N/A Exudate Color: red, brown red, brown N/A Granulation Amount: Large (67-100%) Medium (34-66%) None Present (0%) Granulation Quality: Pink Red N/A Necrotic Amount: Small (1-33%) Medium (34-66%) None Present (0%) Exposed Structures: Fat Layer (Subcutaneous Tissue): Fat Layer (Subcutaneous Tissue): Fascia: No Yes Yes Fat Layer (Subcutaneous Tissue): Fascia: No Fascia: No No Tendon: No Tendon: No Tendon:  No Muscle: No Muscle: No Muscle: No Joint: No Joint: No Joint: No Bone: No Bone: No Bone: No Wound Number: 6 N/A N/A Photos: N/A N/A [name], [name] ([id number]) Wound Location: Right, Medial, Anterior Lower Leg N/A N/A Wounding Event: Gradually Appeared N/A N/A Primary Etiology: Venous Leg Ulcer N/A N/A Comorbid History: Cataracts, Lymphedema, Asthma, N/A N/A Sleep Apnea, Arrhythmia, Congestive Heart Failure, Coronary Artery Disease, Hypertension, Type II Diabetes, End Stage Renal Disease, Gout, Osteoarthritis, Neuropathy Date Acquired: 05/04/2021 N/A N/A Weeks of Treatment: 0 N/A N/A Wound Status: Open N/A N/A Wound Recurrence: No N/A N/A Measurements L x W x D (cm) 2.8x1.7x0.1 N/A N/A Area (cm) : 3.738 N/A N/A Volume (cm) : 0.374 N/A N/A % Reduction in Area: N/A N/A N/A % Reduction in Volume: N/A N/A N/A Classification: Partial Thickness N/A N/A Exudate Amount: Medium N/A N/A Exudate Type: Serosanguineous N/A N/A Exudate Color: red, brown N/A N/A Granulation Amount: Large (67-100%) N/A N/A Granulation Quality: Red N/A N/A Necrotic Amount: Small (1-33%) N/A N/A Exposed Structures: Fascia: No N/A N/A Fat Layer (Subcutaneous Tissue): No Tendon: No Muscle: No Joint: No Bone: No Limited to Skin Breakdown Treatment Notes Electronic Signature(s) Signed: 05/07/2021 3:22:57 PM By: Donnamarie Poag Entered By: Donnamarie Poag on 05/07/2021 09:03:15 [name] ([id number]) -------------------------------------------------------------------------------- [place] Details Patient Name: [name]. Date of Service: 05/07/2021 8:30 AM Medical Record Number: [id number] Patient Account Number: [id number] Date of Birth/Sex: 1961/01/08 (61 y.o. M) Treating RN: Donnamarie Poag Primary Care Joscelynn Brutus: [name] Other Clinician: Referring Sania Noy: [name] Treating Amiley Shishido/Extender: Skipper Cliche in Treatment: 3 Active Inactive Wound/Skin  Impairment Nursing Diagnoses: Impaired tissue integrity Knowledge deficit related to smoking impact on wound healing Knowledge deficit related to ulceration/compromised skin integrity Goals: Patient/caregiver will verbalize understanding of skin care regimen Date Initiated: 04/16/2021 Date Inactivated: 04/24/2021 Target Resolution Date: 04/27/2021 Goal Status: Met Ulcer/skin breakdown will have a volume reduction of 30% by week 4 Date Initiated: 04/16/2021 Date Inactivated: 04/30/2021 Target Resolution Date: 05/14/2021 Goal Status: Met Ulcer/skin breakdown will have a volume reduction of 50% by week 8 Date Initiated: 04/16/2021 Target Resolution Date: 06/11/2021 Goal Status: Active Ulcer/skin breakdown will have a volume reduction of 80% by week 12 Date Initiated: 04/16/2021 Target Resolution Date: 07/09/2021 Goal Status: Active Ulcer/skin breakdown will heal within 14 weeks Date Initiated: 04/16/2021 Target Resolution Date: 07/23/2021 Goal Status: Active Interventions: Assess patient/caregiver ability to obtain necessary supplies Assess patient/caregiver ability to perform ulcer/skin care regimen upon admission and as needed Assess ulceration(s) every visit Notes: Electronic Signature(s) Signed: 05/07/2021 3:22:57 PM By: Lyndel Safe,  Joy Entered By: Donnamarie Poag on 05/07/2021 08:57:13 [place], [name] ([id number]) -------------------------------------------------------------------------------- Pain Assessment Details Patient Name: [name], [name]. Date of Service: 05/07/2021 8:30 AM Medical Record Number: [id number] Patient Account Number: [id number] Date of Birth/Sex: Jun 25, 1960 (61 y.o. M) Treating RN: Donnamarie Poag Primary Care : [name] Other Clinician: Referring : [name] Treating /Extender: Skipper Cliche in Treatment: 3 Active Problems Location of Pain Severity and Description of Pain Patient Has Paino No Site Locations Rate the  pain. Current Pain Level: 0 Pain Management and Medication Current Pain Management: Electronic Signature(s) Signed: 05/07/2021 3:22:57 PM By: Donnamarie Poag Entered By: Donnamarie Poag on 05/07/2021 08:45:50 [name], [name] ([id number]) -------------------------------------------------------------------------------- Patient/Caregiver Education Details Patient Name: [name]. Date of Service: 05/07/2021 8:30 AM Medical Record Number: [id number] Patient Account Number: [id number] Date of Birth/Gender: 1960/09/24 (61 y.o. M) Treating RN: Donnamarie Poag Primary Care Physician: [name] Other Clinician: Referring Physician: [name] Treating Physician/Extender: Skipper Cliche in Treatment: 3 Education Assessment Education Provided To: Patient Education Topics Provided Basic Hygiene: Venous: Wound/Skin Impairment: Electronic Signature(s) Signed: 05/07/2021 3:22:57 PM By: Donnamarie Poag Entered By: Donnamarie Poag on 05/07/2021 09:11:23 [name] ([id number]) -------------------------------------------------------------------------------- Wound Assessment Details Patient Name: Carl Speller F. Date of Service: 05/07/2021 8:30 AM Medical Record Number: [id number] Patient Account Number: [id number] Date of Birth/Sex: 04-20-1960 (61 y.o. M) Treating RN: Donnamarie Poag Primary Care : [name] Other Clinician: Referring : [name] Treating /Extender: Skipper Cliche in Treatment: 3 Wound Status Wound Number: 3 Primary Venous Leg Ulcer Etiology: Wound Location: Left, Anterior Lower Leg Wound Open Wounding Event: Blister Status: Date Acquired: 03/08/2021 Comorbid Cataracts, Lymphedema, Asthma, Sleep Apnea, Weeks Of Treatment: 3 History: Arrhythmia, Congestive Heart Failure, Coronary Artery Clustered Wound: No Disease, Hypertension, Type II Diabetes, End Stage Renal Disease, Gout, Osteoarthritis, Neuropathy Photos Wound  Measurements Length: (cm) 0.8 Width: (cm) 0.3 Depth: (cm) 0.1 Area: (cm) 0.188 Volume: (cm) 0.019 % Reduction in Area: 96.1% % Reduction in Volume: 96% Tunneling: No Undermining: No Wound Description Classification: Full Thickness Without Exposed Support Structures Exudate Amount: Medium Exudate Type: Serosanguineous Exudate Color: red, brown Foul Odor After Cleansing: No Slough/Fibrino Yes Wound Bed Granulation Amount: Large (67-100%) Exposed Structure Granulation Quality: Pink Fascia Exposed: No Necrotic Amount: Small (1-33%) Fat Layer (Subcutaneous Tissue) Exposed: Yes Necrotic Quality: Adherent Slough Tendon Exposed: No Muscle Exposed: No Joint Exposed: No Bone Exposed: No Treatment Notes Wound #3 (Lower Leg) Wound Laterality: Left, Anterior Cleanser Soap and Water Discharge Instruction: Gently cleanse wound with antibacterial soap, rinse and pat dry prior to dressing wounds Wound Cleanser [name], VANDERMEULEN. ([id number]) Discharge Instruction: Wash your hands with soap and water. Remove old dressing, discard into plastic bag and place into trash. Cleanse the wound with Wound Cleanser prior to applying a clean dressing using gauze sponges, not tissues or cotton balls. Do not scrub or use excessive force. Pat dry using gauze sponges, not tissue or cotton balls. Peri-Wound Care Topical Primary Dressing Iodosorb 40 (g) Discharge Instruction: Apply IodoSorb to wound bed only as directed. Secondary Dressing Zetuvit Plus Silicone Border Dressing 4x4 (in/in) Secured With Compression Wrap Compression Stockings Add-Ons Electronic Signature(s) Signed: 05/07/2021 3:22:57 PM By: Donnamarie Poag Entered By: Donnamarie Poag on 05/07/2021 08:52:15 [name], [name] ([id number]) -------------------------------------------------------------------------------- Wound Assessment Details Patient Name: Carl Speller F. Date of Service: 05/07/2021 8:30 AM Medical Record Number:  [id number] Patient Account Number: [id number] Date of Birth/Sex: [birth year] (61 y.o. M) Treating RN: Donnamarie Poag Primary Care : [name] Other  Clinician: Referring : [name] Treating /Extender: Skipper Cliche in Treatment: 3 Wound Status Wound Number: 4 Primary Venous Leg Ulcer Etiology: Wound Location: Right, Medial, Anterior Lower Leg Wound Open Wounding Event: Blister Status: Date Acquired: 03/08/2021 Comorbid Cataracts, Lymphedema, Asthma, Sleep Apnea, Weeks Of Treatment: 3 History: Arrhythmia, Congestive Heart Failure, Coronary Artery Clustered Wound: No Disease, Hypertension, Type II Diabetes, End Stage Renal Disease, Gout, Osteoarthritis, Neuropathy Photos Wound Measurements Length: (cm) 1 Width: (cm) 0.8 Depth: (cm) 0.1 Area: (cm) 0.628 Volume: (cm) 0.063 % Reduction in Area: 71.9% % Reduction in Volume: 71.9% Tunneling: No Undermining: No Wound Description Classification: Full Thickness Without Exposed Support Structures Exudate Amount: Medium Exudate Type: Serosanguineous Exudate Color: red, brown Foul Odor After Cleansing: No Slough/Fibrino Yes Wound Bed Granulation Amount: Medium (34-66%) Exposed Structure Granulation Quality: Red Fascia Exposed: No Necrotic Amount: Medium (34-66%) Fat Layer (Subcutaneous Tissue) Exposed: Yes Necrotic Quality: Adherent Slough Tendon Exposed: No Muscle Exposed: No Joint Exposed: No Bone Exposed: No Treatment Notes Wound #4 (Lower Leg) Wound Laterality: Right, Medial, Anterior Cleanser Byram Ancillary Kit - 15 Day Supply Discharge Instruction: Use supplies as instructed; Kit contains: (15) Saline Bullets; (15) 3x3 Gauze; 15 pr Gloves Soap and [address] AMAREE, [name] ([id number]) Discharge Instruction: Gently cleanse wound with antibacterial soap, rinse and pat dry prior to dressing wounds Wound Cleanser Discharge Instruction: Wash your hands with soap and water. Remove old  dressing, discard into plastic bag and place into trash. Cleanse the wound with Wound Cleanser prior to applying a clean dressing using gauze sponges, not tissues or cotton balls. Do not scrub or use excessive force. Pat dry using gauze sponges, not tissue or cotton balls. Peri-Wound Care Topical Primary Dressing Iodosorb 40 (g) Discharge Instruction: Apply IodoSorb to wound bed only as directed. Secondary Dressing Zetuvit Plus Silicone Border Dressing 4x4 (in/in) Secured With Compression Wrap Compression Stockings Add-Ons Electronic Signature(s) Signed: 05/07/2021 3:22:57 PM By: Donnamarie Poag Entered By: Donnamarie Poag on 05/07/2021 08:53:00 [place], [name] ([id number]) -------------------------------------------------------------------------------- Wound Assessment Details Patient Name: Carl Speller F. Date of Service: 05/07/2021 8:30 AM Medical Record Number: [id number] Patient Account Number: [id number] Date of Birth/Sex: 1961-03-07 (61 y.o. M) Treating RN: Donnamarie Poag Primary Care : [name] Other Clinician: Referring : [name] Treating /Extender: Skipper Cliche in Treatment: 3 Wound Status Wound Number: 5 Primary Venous Leg Ulcer Etiology: Wound Location: Right, Lateral, Anterior Lower Leg Wound Healed - Epithelialized Wounding Event: Blister Status: Date Acquired: 03/08/2021 Comorbid Cataracts, Lymphedema, Asthma, Sleep Apnea, Weeks Of Treatment: 3 History: Arrhythmia, Congestive Heart Failure, Coronary Artery Clustered Wound: No Disease, Hypertension, Type II Diabetes, End Stage Renal Disease, Gout, Osteoarthritis, Neuropathy Photos Wound Measurements Length: (cm) 0 Width: (cm) 0 Depth: (cm) 0 Area: (cm) 0 Volume: (cm) 0 % Reduction in Area: 100% % Reduction in Volume: 100% Tunneling: No Undermining: No Wound Description Classification: Full Thickness Without Exposed Support Structures Exudate Amount: None  Present Foul Odor After Cleansing: No Slough/Fibrino No Wound Bed Granulation Amount: None Present (0%) Exposed Structure Necrotic Amount: None Present (0%) Fascia Exposed: No Fat Layer (Subcutaneous Tissue) Exposed: No Tendon Exposed: No Muscle Exposed: No Joint Exposed: No Bone Exposed: No Electronic Signature(s) Signed: 05/07/2021 3:22:57 PM By: Donnamarie Poag Entered By: Donnamarie Poag on 05/07/2021 08:53:41 Dazey, Kahli F. ([id number]) -------------------------------------------------------------------------------- Wound Assessment Details Patient Name: Carl Speller F. Date of Service: 05/07/2021 8:30 AM Medical Record Number: [id number] Patient Account Number: [id number] Date of Birth/Sex: [birth year] (61 y.o. M) Treating RN: Donnamarie Poag Primary Care :  [name] Other Clinician: Referring : [name] Treating /Extender: Skipper Cliche in Treatment: 3 Wound Status Wound Number: 6 Primary Venous Leg Ulcer Etiology: Wound Location: Right, Medial, Anterior Lower Leg Wound Open Wounding Event: Gradually Appeared Status: Date Acquired: 05/04/2021 Comorbid Cataracts, Lymphedema, Asthma, Sleep Apnea, Weeks Of Treatment: 0 History: Arrhythmia, Congestive Heart Failure, Coronary Artery Clustered Wound: No Disease, Hypertension, Type II Diabetes, End Stage Renal Disease, Gout, Osteoarthritis, Neuropathy Photos Wound Measurements Length: (cm) 2.8 Width: (cm) 1.7 Depth: (cm) 0.1 Area: (cm) 3.738 Volume: (cm) 0.374 % Reduction in Area: % Reduction in Volume: Tunneling: No Undermining: No Wound Description Classification: Partial Thickness Exudate Amount: Medium Exudate Type: Serosanguineous Exudate Color: red, brown Foul Odor After Cleansing: No Slough/Fibrino Yes Wound Bed Granulation Amount: Large (67-100%) Exposed Structure Granulation Quality: Red Fascia Exposed: No Necrotic Amount: Small (1-33%) Fat Layer (Subcutaneous Tissue)  Exposed: No Necrotic Quality: Adherent Slough Tendon Exposed: No Muscle Exposed: No Joint Exposed: No Bone Exposed: No Limited to Skin Breakdown Treatment Notes Wound #6 (Lower Leg) Wound Laterality: Right, Medial, Anterior Cleanser Byram Ancillary Kit - 15 Day Supply Discharge Instruction: Use supplies as instructed; Kit contains: (15) Saline Bullets; (15) 3x3 Gauze; 15 pr Gloves [name], [name] ([id number]) Soap and Water Discharge Instruction: Gently cleanse wound with antibacterial soap, rinse and pat dry prior to dressing wounds Wound Cleanser Discharge Instruction: Wash your hands with soap and water. Remove old dressing, discard into plastic bag and place into trash. Cleanse the wound with Wound Cleanser prior to applying a clean dressing using gauze sponges, not tissues or cotton balls. Do not scrub or use excessive force. Pat dry using gauze sponges, not tissue or cotton balls. Peri-Wound Care Topical Primary Dressing Iodosorb 40 (g) Discharge Instruction: Apply IodoSorb to wound bed only as directed. Secondary Dressing Zetuvit Plus Silicone Border Dressing 4x4 (in/in) Secured With Compression Wrap Compression Stockings Add-Ons Electronic Signature(s) Signed: 05/07/2021 3:22:57 PM By: Donnamarie Poag Entered By: Donnamarie Poag on 05/07/2021 08:55:49 [name], [name] ([id number]) -------------------------------------------------------------------------------- [place] Details Patient Name: [name]. Date of Service: 05/07/2021 8:30 AM Medical Record Number: [id number] Patient Account Number: [id number] Date of Birth/Sex: [birth year] (61 y.o. M) Treating RN: Donnamarie Poag Primary Care : [name] Other Clinician: Referring : [name] Treating /Extender: Skipper Cliche in Treatment: 3 Vital Signs Time Taken: 08:45 Temperature (F): 97.7 Height (in): 70 Pulse (bpm): 68 Weight (lbs): 270 Respiratory Rate (breaths/min): 16 Body  Mass Index (BMI): 38.7 Blood Pressure (mmHg): 154/57 Reference Range: 80 - 120 mg / dl Electronic Signature(s) Signed: 05/07/2021 3:22:57 PM By: Donnamarie Poag Entered ByDonnamarie Poag on 05/07/2021 08:45:41

## 2021-05-07 NOTE — Progress Notes (Addendum)
[name] ([id number]) Visit Report for 05/07/2021 Chief Complaint Document Details Patient Name: [name], [name]. Date of Service: 05/07/2021 8:30 AM Medical Record Number: [id number] Patient Account Number: [id number] Date of Birth/Sex: 01/21/1961 (61 y.o. M) Treating RN: Donnamarie Poag Primary Care Provider: [name] Other Clinician: Referring Provider: [name] Treating Provider/Extender: Skipper Cliche in Treatment: 3 Information Obtained from: Patient Chief Complaint Bilateral LE Ulcers Electronic Signature(s) Signed: 05/07/2021 8:48:41 AM By: Worthy Keeler PA-C Entered By: Worthy Keeler on 05/07/2021 08:48:41 [name], [name] ([id number]) -------------------------------------------------------------------------------- Debridement Details Patient Name: [name]. Date of Service: 05/07/2021 8:30 AM Medical Record Number: [id number] Patient Account Number: [id number] Date of Birth/Sex: December 13, 1960 (61 y.o. M) Treating RN: Donnamarie Poag Primary Care Provider: [name] Other Clinician: Referring Provider: [name] Treating Provider/Extender: Skipper Cliche in Treatment: 3 Debridement Performed for Wound #3 Left,Anterior Lower Leg Assessment: Performed By: Physician [name]., PA-C Debridement Type: Debridement Severity of Tissue Pre Debridement: Fat layer exposed Level of Consciousness (Pre- Awake and Alert procedure): Pre-procedure Verification/Time Out Yes - 09:04 Taken: Start Time: 09:05 Pain Control: Lidocaine Total Area Debrided (L x W): 0.8 (cm) x 0.3 (cm) = 0.24 (cm) Tissue and other material Viable, Non-Viable, Slough, Subcutaneous, Slough debrided: Level: Skin/Subcutaneous Tissue Debridement Description: Excisional Instrument: Curette Bleeding: Minimum Hemostasis Achieved: Pressure Response to Treatment: Procedure was tolerated well Level of Consciousness (Post- Awake and Alert procedure): Post Debridement  Measurements of Total Wound Length: (cm) 0.8 Width: (cm) 0.3 Depth: (cm) 0.1 Volume: (cm) 0.019 Character of Wound/Ulcer Post Debridement: Improved Severity of Tissue Post Debridement: Fat layer exposed Post Procedure Diagnosis Same as Pre-procedure Electronic Signature(s) Signed: 05/07/2021 9:31:21 AM By: Worthy Keeler PA-C Signed: 05/07/2021 3:22:57 PM By: Donnamarie Poag Entered By: Donnamarie Poag on 05/07/2021 09:07:07 [name] ([id number]) -------------------------------------------------------------------------------- Debridement Details Patient Name: [name]. Date of Service: 05/07/2021 8:30 AM Medical Record Number: [id number] Patient Account Number: [id number] Date of Birth/Sex: 05/15/1960 (61 y.o. M) Treating RN: Donnamarie Poag Primary Care Provider: [name] Other Clinician: Referring Provider: [name] Treating Provider/Extender: Skipper Cliche in Treatment: 3 Debridement Performed for Wound #4 Right,Medial,Anterior Lower Leg Assessment: Performed By: Physician [name]., PA-C Debridement Type: Debridement Severity of Tissue Pre Debridement: Fat layer exposed Level of Consciousness (Pre- Awake and Alert procedure): Pre-procedure Verification/Time Out Yes - 09:04 Taken: Start Time: 09:05 Pain Control: Lidocaine Total Area Debrided (L x W): 1 (cm) x 0.8 (cm) = 0.8 (cm) Tissue and other material Viable, Non-Viable, Slough, Subcutaneous, Slough debrided: Level: Skin/Subcutaneous Tissue Debridement Description: Excisional Instrument: Curette Bleeding: Moderate Hemostasis Achieved: Silver Nitrate Response to Treatment: Procedure was tolerated well Level of Consciousness (Post- Awake and Alert procedure): Post Debridement Measurements of Total Wound Length: (cm) 1 Width: (cm) 0.7 Depth: (cm) 0.1 Volume: (cm) 0.055 Character of Wound/Ulcer Post Debridement: Improved Severity of Tissue Post Debridement: Fat layer exposed Post  Procedure Diagnosis Same as Pre-procedure Electronic Signature(s) Signed: 05/07/2021 9:31:21 AM By: Worthy Keeler PA-C Signed: 05/07/2021 3:22:57 PM By: Donnamarie Poag Entered By: Donnamarie Poag on 05/07/2021 09:07:33 [name], [name] ([id number]) -------------------------------------------------------------------------------- HPI Details Patient Name: [name]. Date of Service: 05/07/2021 8:30 AM Medical Record Number: [id number] Patient Account Number: [id number] Date of Birth/Sex: 01/10/1961 (61 y.o. M) Treating RN: Donnamarie Poag Primary Care Provider: [name] Other Clinician: Referring Provider: [name] Treating Provider/Extender: Skipper Cliche in Treatment: 3 History of Present Illness Location: left anterior shin and left medial calf Quality: Patient  reports experiencing a dull pain to affected area(s). Severity: Patient states wound are getting worse. Duration: Patient has had the wound for > 3 months prior to seeking treatment at the wound center Timing: Pain in wound is Intermittent (comes and goes Context: The wound appeared gradually over time Modifying Factors: Other treatment(s) tried include:had surgery on the left anterior shin for a hematoma which has been nonhealing for the last 8 months Associated Signs and Symptoms: Patient reports having increase swelling. HPI Description: 61 year old diabetic was last hemoglobin A1c was 7.2% has been reviewed today with 2 ulcerated areas in his left lower extremity which she's had for about 8 months and the one most recently was started as a blister for 2 weeks. he has a past medical history of atrial fibrillation, coronary artery disease, CHF, chronic venous stasis dermatitis, diabetes mellitus, hypertension, sleep apnea and is status post kidney transplant in 2012. He is a former smoker and quit cigars in 1996. In the remote past he's had some arterial and venous duplex studies done but does not recall the results  no was ever treated for venous insufficiency. 06/10/2016 -- -- had a office visit at [facility] seen by the physicianos assistant [facility]. Patient underwent an ABI which showed medial calcification, normal tibial artery Doppler waveform, PPG waveforms and toe brachial indices suggest normal arterial perfusion to bilateral lower extremities. right TBI was 0.97 and left was 0.84 Bilateral venous duplex was notable for venous reflux in the left greater saphenous vein and popliteal veins.. A left lower extremity endovenous laser ablation was recommended. 06/27/2016 -- I understand he spoke to his insurance company and there is a 3 month waiting period with conservative therapy prior to authorizing surgical intervention. 07/12/16- he is here for follow-up evaluation of a left lower extremity ulcer. He did bring his compression stockings today. He is voicing no complaints or concerns Readmission: 04/16/2021 upon evaluation today patient appears for reevaluation here in the clinic he was last seen in April 2018. At that time he was having issues with venous stasis ulcers and that again is what he is dealing with today as well. Fortunately I do not see any signs of active infection at this time which is great news and overall very pleased in that regard. Nonetheless he does have some areas of eschar which is going require debridement to clear away some of the necrotic debris so that we can hopefully get this healed as quickly as possible. That was discussed with the patient today as well. The patient does have diabetes mellitus type 2 for which she does have a hemoglobin A1c of 7.27 November 2020. He also has been on Keflex for what was felt to be cellulitis that seems to be doing better these wounds began on March 08, 2021. The patient also has a history of lymphedema, chronic venous insufficiency, and is a kidney transplant patient. 04/24/2021 upon evaluation today patient  appears to be doing okay in regard to his wounds. Fortunately there does not appear to be any evidence of active infection locally nor systemically at this time which is good news. Nonetheless he was not able to keep the compression wraps on I think this is going be a little bit more problem he tells me his anxiety was to the point he just was not able to do it. I do understand but at the same time that is good to make things somewhat difficult to be honest. 04/30/2021 upon evaluation today patient appears to  be doing well with regard to his wounds on his legs. Fortunately I do not see any signs of active infection locally or systemically at this time which is great news. No fevers, chills, nausea, vomiting, or diarrhea. 05/07/2021 upon evaluation patient appears to be doing well with regard to his wounds. He does have a new area on the right leg due to a skin tear as he was pulling up his compression socks. This obviously is a little frustrating for him everything else seems to be doing much better his blood flow was good I did print off the test today shows that he has excellent TBI's which is also news. Electronic Signature(s) Signed: 05/07/2021 9:23:06 AM By: Worthy Keeler PA-C Entered By: Worthy Keeler on 05/07/2021 09:23:06 [name], [name] ([id number]) -------------------------------------------------------------------------------- Physical Exam Details Patient Name: [name], [name]. Date of Service: 05/07/2021 8:30 AM Medical Record Number: [id number] Patient Account Number: [id number] Date of Birth/Sex: 1961/02/25 (61 y.o. M) Treating RN: Donnamarie Poag Primary Care Provider: [name] Other Clinician: Referring Provider: [name] Treating Provider/Extender: Skipper Cliche in Treatment: 3 Constitutional Well-nourished and well-hydrated in no acute distress. Respiratory normal breathing without difficulty. Psychiatric this patient is able to make decisions and  demonstrates good insight into disease process. Alert and Oriented x 3. pleasant and cooperative. Notes Upon inspection patient's wound bed actually showed signs of good granulation epithelization at this point. Fortunately I do not see any evidence of active infection locally or systemically which is great news and overall I am extremely pleased with where we stand today. No fevers, chills, nausea, vomiting, or diarrhea. Electronic Signature(s) Signed: 05/07/2021 9:23:32 AM By: Worthy Keeler PA-C Entered By: Worthy Keeler on 05/07/2021 09:23:31 [name], [name] ([id number]) -------------------------------------------------------------------------------- Physician Orders Details Patient Name: [name], [name]. Date of Service: 05/07/2021 8:30 AM Medical Record Number: [id number] Patient Account Number: [id number] Date of Birth/Sex: 11-26-1960 (61 y.o. M) Treating RN: Donnamarie Poag Primary Care Provider: [name] Other Clinician: Referring Provider: [name] Treating Provider/Extender: Skipper Cliche in Treatment: 3 Verbal / Phone Orders: No Diagnosis Coding ICD-10 Coding Code Description E11.622 Type 2 diabetes mellitus with other skin ulcer I89.0 Lymphedema, not elsewhere classified L97.822 Non-pressure chronic ulcer of other part of left lower leg with fat layer exposed L97.812 Non-pressure chronic ulcer of other part of right lower leg with fat layer exposed I87.312 Chronic venous hypertension (idiopathic) with ulcer of left lower extremity I87.311 Chronic venous hypertension (idiopathic) with ulcer of right lower extremity Z94.0 Kidney transplant status Follow-up Appointments o Return Appointment in 1 week. o Nurse Visit as needed Bathing/ Shower/ Hygiene o May shower; gently cleanse wound with antibacterial soap, rinse and pat dry prior to dressing wounds o May shower with wound dressing protected with water repellent cover or cast protector. o No tub  bath. Anesthetic (Use 'Patient Medications' Section for Anesthetic Order Entry) o Lidocaine applied to wound bed Edema Control - Lymphedema / Segmental Compressive Device / Other o Patient to wear own compression stockings. Remove compression stockings every night before going to bed and put on every morning when getting up. - refused compression wrap o Elevate leg(s) parallel to the floor when sitting. o DO YOUR BEST to sleep in the bed at night. DO NOT sleep in your recliner. Long hours of sitting in a recliner leads to swelling of the legs and/or potential wounds on your backside. Additional Orders / Instructions o Follow Nutritious Diet and Increase Protein Intake - continue to monitor  blood sugar Wound Treatment Wound #3 - Lower Leg Wound Laterality: Left, Anterior Cleanser: Soap and Water 3 x Per Week/15 Days Discharge Instructions: Gently cleanse wound with antibacterial soap, rinse and pat dry prior to dressing wounds Cleanser: Wound Cleanser 3 x Per Week/15 Days Discharge Instructions: Wash your hands with soap and water. Remove old dressing, discard into plastic bag and place into trash. Cleanse the wound with Wound Cleanser prior to applying a clean dressing using gauze sponges, not tissues or cotton balls. Do not scrub or use excessive force. Pat dry using gauze sponges, not tissue or cotton balls. Primary Dressing: Iodosorb 40 (g) (Generic) 3 x Per Week/15 Days Discharge Instructions: Apply IodoSorb to wound bed only as directed. Secondary Dressing: Zetuvit Plus Silicone Border Dressing 4x4 (in/in) (DME) (Generic) 3 x Per Week/15 Days Wound #4 - Lower Leg Wound Laterality: Right, Medial, Anterior Cleanser: Byram Ancillary Kit - 15 Day Supply (Generic) 3 x Per Week/15 Days Discharge Instructions: Use supplies as instructed; Kit contains: (15) Saline Bullets; (15) 3x3 Gauze; 15 pr Gloves Cleanser: Soap and Water 3 x Per Week/15 Days Discharge Instructions: Gently  cleanse wound with antibacterial soap, rinse and pat dry prior to dressing wounds [name], [name] ([id number]) Cleanser: Wound Cleanser 3 x Per Week/15 Days Discharge Instructions: Wash your hands with soap and water. Remove old dressing, discard into plastic bag and place into trash. Cleanse the wound with Wound Cleanser prior to applying a clean dressing using gauze sponges, not tissues or cotton balls. Do not scrub or use excessive force. Pat dry using gauze sponges, not tissue or cotton balls. Primary Dressing: Iodosorb 40 (g) (Generic) 3 x Per Week/15 Days Discharge Instructions: Apply IodoSorb to wound bed only as directed. Secondary Dressing: Zetuvit Plus Silicone Border Dressing 4x4 (in/in) (DME) (Generic) 3 x Per Week/15 Days Wound #6 - Lower Leg Wound Laterality: Right, Medial, Anterior Cleanser: Byram Ancillary Kit - 15 Day Supply (Generic) 3 x Per Week/15 Days Discharge Instructions: Use supplies as instructed; Kit contains: (15) Saline Bullets; (15) 3x3 Gauze; 15 pr Gloves Cleanser: Soap and Water 3 x Per Week/15 Days Discharge Instructions: Gently cleanse wound with antibacterial soap, rinse and pat dry prior to dressing wounds Cleanser: Wound Cleanser 3 x Per Week/15 Days Discharge Instructions: Wash your hands with soap and water. Remove old dressing, discard into plastic bag and place into trash. Cleanse the wound with Wound Cleanser prior to applying a clean dressing using gauze sponges, not tissues or cotton balls. Do not scrub or use excessive force. Pat dry using gauze sponges, not tissue or cotton balls. Primary Dressing: Iodosorb 40 (g) (Generic) 3 x Per Week/15 Days Discharge Instructions: Apply IodoSorb to wound bed only as directed. Secondary Dressing: Zetuvit Plus Silicone Border Dressing 4x4 (in/in) (DME) (Generic) 3 x Per Week/15 Days Electronic Signature(s) Signed: 05/07/2021 9:31:21 AM By: Worthy Keeler PA-C Signed: 05/07/2021 3:22:57 PM By: Donnamarie Poag Entered  By: Donnamarie Poag on 05/07/2021 09:09:34 [name], [name] ([id number]) -------------------------------------------------------------------------------- Problem List Details Patient Name: [name], [name]. Date of Service: 05/07/2021 8:30 AM Medical Record Number: [id number] Patient Account Number: [id number] Date of Birth/Sex: Dec 08, 1960 (61 y.o. M) Treating RN: Donnamarie Poag Primary Care Provider: [name] Other Clinician: Referring Provider: [name] Treating Provider/Extender: Skipper Cliche in Treatment: 3 Active Problems ICD-10 Encounter Code Description Active Date MDM Diagnosis E11.622 Type 2 diabetes mellitus with other skin ulcer 04/16/2021 No Yes I89.0 Lymphedema, not elsewhere classified 04/16/2021 No Yes L97.822 Non-pressure chronic ulcer of other  part of left lower leg with fat layer 04/16/2021 No Yes exposed L97.812 Non-pressure chronic ulcer of other part of right lower leg with fat layer 04/16/2021 No Yes exposed I87.312 Chronic venous hypertension (idiopathic) with ulcer of left lower 04/16/2021 No Yes extremity I87.311 Chronic venous hypertension (idiopathic) with ulcer of right lower 04/16/2021 No Yes extremity Z94.0 Kidney transplant status 04/16/2021 No Yes Inactive Problems Resolved Problems Electronic Signature(s) Signed: 05/07/2021 8:44:30 AM By: Worthy Keeler PA-C Entered By: Worthy Keeler on 05/07/2021 08:44:29 Biagini, [name] ([id number]) -------------------------------------------------------------------------------- Progress Note Details Patient Name: Carl Speller F. Date of Service: 05/07/2021 8:30 AM Medical Record Number: [id number] Patient Account Number: [id number] Date of Birth/Sex: June 30, 1960 (61 y.o. M) Treating RN: Donnamarie Poag Primary Care Provider: [name] Other Clinician: Referring Provider: [name] Treating Provider/Extender: Skipper Cliche in Treatment: 3 Subjective Chief Complaint Information obtained from  Patient Bilateral LE Ulcers History of Present Illness (HPI) The following HPI elements were documented for the patient's wound: Location: left anterior shin and left medial calf Quality: Patient reports experiencing a dull pain to affected area(s). Severity: Patient states wound are getting worse. Duration: Patient has had the wound for > 3 months prior to seeking treatment at the wound center Timing: Pain in wound is Intermittent (comes and goes Context: The wound appeared gradually over time Modifying Factors: Other treatment(s) tried include:had surgery on the left anterior shin for a hematoma which has been nonhealing for the last 8 months Associated Signs and Symptoms: Patient reports having increase swelling. 61 year old diabetic was last hemoglobin A1c was 7.2% has been reviewed today with 2 ulcerated areas in his left lower extremity which she's had for about 8 months and the one most recently was started as a blister for 2 weeks. he has a past medical history of atrial fibrillation, coronary artery disease, CHF, chronic venous stasis dermatitis, diabetes mellitus, hypertension, sleep apnea and is status post kidney transplant in 2012. He is a former smoker and quit cigars in 1996. In the remote past he's had some arterial and venous duplex studies done but does not recall the results no was ever treated for venous insufficiency. 06/10/2016 -- -- had a office visit at [facility] seen by the physician s assistant Marcelle Overlie. Patient underwent an ABI which showed medial calcification, normal tibial artery Doppler waveform, PPG waveforms and toe brachial indices suggest normal arterial perfusion to bilateral lower extremities. right TBI was 0.97 and left was 0.84 Bilateral venous duplex was notable for venous reflux in the left greater saphenous vein and popliteal veins.. A left lower extremity endovenous laser ablation was recommended. 06/27/2016 -- I understand  he spoke to his insurance company and there is a 3 month waiting period with conservative therapy prior to authorizing surgical intervention. 07/12/16- he is here for follow-up evaluation of a left lower extremity ulcer. He did bring his compression stockings today. He is voicing no complaints or concerns Readmission: 04/16/2021 upon evaluation today patient appears for reevaluation here in the clinic he was last seen in April 2018. At that time he was having issues with venous stasis ulcers and that again is what he is dealing with today as well. Fortunately I do not see any signs of active infection at this time which is great news and overall very pleased in that regard. Nonetheless he does have some areas of eschar which is going require debridement to clear away some of the necrotic debris so that we can hopefully get this  healed as quickly as possible. That was discussed with the patient today as well. The patient does have diabetes mellitus type 2 for which she does have a hemoglobin A1c of 7.27 November 2020. He also has been on Keflex for what was felt to be cellulitis that seems to be doing better these wounds began on March 08, 2021. The patient also has a history of lymphedema, chronic venous insufficiency, and is a kidney transplant patient. 04/24/2021 upon evaluation today patient appears to be doing okay in regard to his wounds. Fortunately there does not appear to be any evidence of active infection locally nor systemically at this time which is good news. Nonetheless he was not able to keep the compression wraps on I think this is going be a little bit more problem he tells me his anxiety was to the point he just was not able to do it. I do understand but at the same time that is good to make things somewhat difficult to be honest. 04/30/2021 upon evaluation today patient appears to be doing well with regard to his wounds on his legs. Fortunately I do not see any signs of active  infection locally or systemically at this time which is great news. No fevers, chills, nausea, vomiting, or diarrhea. 05/07/2021 upon evaluation patient appears to be doing well with regard to his wounds. He does have a new area on the right leg due to a skin tear as he was pulling up his compression socks. This obviously is a little frustrating for him everything else seems to be doing much better his blood flow was good I did print off the test today shows that he has excellent TBI's which is also news. [name], [name] ([id number]) Objective Constitutional Well-nourished and well-hydrated in no acute distress. Vitals Time Taken: 8:45 AM, Height: 70 in, Weight: 270 lbs, BMI: 38.7, Temperature: 97.7 F, Pulse: 68 bpm, Respiratory Rate: 16 breaths/min, Blood Pressure: 154/57 mmHg. Respiratory normal breathing without difficulty. Psychiatric this patient is able to make decisions and demonstrates good insight into disease process. Alert and Oriented x 3. pleasant and cooperative. General Notes: Upon inspection patient's wound bed actually showed signs of good granulation epithelization at this point. Fortunately I do not see any evidence of active infection locally or systemically which is great news and overall I am extremely pleased with where we stand today. No fevers, chills, nausea, vomiting, or diarrhea. Integumentary (Hair, Skin) Wound #3 status is Open. Original cause of wound was Blister. The date acquired was: 03/08/2021. The wound has been in treatment 3 weeks. The wound is located on the Left,Anterior Lower Leg. The wound measures 0.8cm length x 0.3cm width x 0.1cm depth; 0.188cm^2 area and 0.019cm^3 volume. There is Fat Layer (Subcutaneous Tissue) exposed. There is no tunneling or undermining noted. There is a medium amount of serosanguineous drainage noted. There is large (67-100%) pink granulation within the wound bed. There is a small (1-33%) amount of necrotic tissue within the  wound bed including Adherent Slough. Wound #4 status is Open. Original cause of wound was Blister. The date acquired was: 03/08/2021. The wound has been in treatment 3 weeks. The wound is located on the Right,Medial,Anterior Lower Leg. The wound measures 1cm length x 0.8cm width x 0.1cm depth; 0.628cm^2 area and 0.063cm^3 volume. There is Fat Layer (Subcutaneous Tissue) exposed. There is no tunneling or undermining noted. There is a medium amount of serosanguineous drainage noted. There is medium (34-66%) red granulation within the wound bed. There is a  medium (34-66%) amount of necrotic tissue within the wound bed including Adherent Slough. Wound #5 status is Healed - Epithelialized. Original cause of wound was Blister. The date acquired was: 03/08/2021. The wound has been in treatment 3 weeks. The wound is located on the Right,Lateral,Anterior Lower Leg. The wound measures 0cm length x 0cm width x 0cm depth; 0cm^2 area and 0cm^3 volume. There is no tunneling or undermining noted. There is a none present amount of drainage noted. There is no granulation within the wound bed. There is no necrotic tissue within the wound bed. Wound #6 status is Open. Original cause of wound was Gradually Appeared. The date acquired was: 05/04/2021. The wound is located on the Right,Medial,Anterior Lower Leg. The wound measures 2.8cm length x 1.7cm width x 0.1cm depth; 3.738cm^2 area and 0.374cm^3 volume. The wound is limited to skin breakdown. There is no tunneling or undermining noted. There is a medium amount of serosanguineous drainage noted. There is large (67-100%) red granulation within the wound bed. There is a small (1-33%) amount of necrotic tissue within the wound bed including Adherent Slough. Assessment Active Problems ICD-10 Type 2 diabetes mellitus with other skin ulcer Lymphedema, not elsewhere classified Non-pressure chronic ulcer of other part of left lower leg with fat layer exposed Non-pressure  chronic ulcer of other part of right lower leg with fat layer exposed Chronic venous hypertension (idiopathic) with ulcer of left lower extremity Chronic venous hypertension (idiopathic) with ulcer of right lower extremity Kidney transplant status Procedures Wound #3 Pre-procedure diagnosis of Wound #3 is a Venous Leg Ulcer located on the Left,Anterior Lower Leg .Severity of Tissue Pre Debridement is: Fat layer exposed. There was a Excisional Skin/Subcutaneous Tissue Debridement with a total area of 0.24 sq cm performed by [name]., PA-C. With the following instrument(s): Curette to remove Viable and Non-Viable tissue/material. Material removed includes Subcutaneous Tissue and Slough and after achieving pain control using Lidocaine. A time out was conducted at 09:04, prior to the start of the procedure. A Minimum amount of bleeding was controlled with Pressure. The procedure was tolerated well. Post Debridement Measurements: 0.8cm length x 0.3cm width x 0.1cm depth; 0.019cm^3 volume. [name], [name] ([id number]) Character of Wound/Ulcer Post Debridement is improved. Severity of Tissue Post Debridement is: Fat layer exposed. Post procedure Diagnosis Wound #3: Same as Pre-Procedure Wound #4 Pre-procedure diagnosis of Wound #4 is a Venous Leg Ulcer located on the Right,Medial,Anterior Lower Leg .Severity of Tissue Pre Debridement is: Fat layer exposed. There was a Excisional Skin/Subcutaneous Tissue Debridement with a total area of 0.8 sq cm performed by [name]., PA-C. With the following instrument(s): Curette to remove Viable and Non-Viable tissue/material. Material removed includes Subcutaneous Tissue and Slough and after achieving pain control using Lidocaine. A time out was conducted at 09:04, prior to the start of the procedure. A Moderate amount of bleeding was controlled with Silver Nitrate. The procedure was tolerated well. Post Debridement Measurements: 1cm length x  0.7cm width x 0.1cm depth; 0.055cm^3 volume. Character of Wound/Ulcer Post Debridement is improved. Severity of Tissue Post Debridement is: Fat layer exposed. Post procedure Diagnosis Wound #4: Same as Pre-Procedure Plan Follow-up Appointments: Return Appointment in 1 week. Nurse Visit as needed Bathing/ Shower/ Hygiene: May shower; gently cleanse wound with antibacterial soap, rinse and pat dry prior to dressing wounds May shower with wound dressing protected with water repellent cover or cast protector. No tub bath. Anesthetic (Use 'Patient Medications' Section for Anesthetic Order Entry): Lidocaine applied to wound bed  Edema Control - Lymphedema / Segmental Compressive Device / Other: Patient to wear own compression stockings. Remove compression stockings every night before going to bed and put on every morning when getting up. - refused compression wrap Elevate leg(s) parallel to the floor when sitting. DO YOUR BEST to sleep in the bed at night. DO NOT sleep in your recliner. Long hours of sitting in a recliner leads to swelling of the legs and/or potential wounds on your backside. Additional Orders / Instructions: Follow Nutritious Diet and Increase Protein Intake - continue to monitor blood sugar WOUND #3: - Lower Leg Wound Laterality: Left, Anterior Cleanser: Soap and Water 3 x Per Week/15 Days Discharge Instructions: Gently cleanse wound with antibacterial soap, rinse and pat dry prior to dressing wounds Cleanser: Wound Cleanser 3 x Per Week/15 Days Discharge Instructions: Wash your hands with soap and water. Remove old dressing, discard into plastic bag and place into trash. Cleanse the wound with Wound Cleanser prior to applying a clean dressing using gauze sponges, not tissues or cotton balls. Do not scrub or use excessive force. Pat dry using gauze sponges, not tissue or cotton balls. Primary Dressing: Iodosorb 40 (g) (Generic) 3 x Per Week/15 Days Discharge Instructions:  Apply IodoSorb to wound bed only as directed. Secondary Dressing: Zetuvit Plus Silicone Border Dressing 4x4 (in/in) (DME) (Generic) 3 x Per Week/15 Days WOUND #4: - Lower Leg Wound Laterality: Right, Medial, Anterior Cleanser: [employer]ram Ancillary Kit - 15 Day Supply (Generic) 3 x Per Week/15 Days Discharge Instructions: Use supplies as instructed; Kit contains: (15) Saline Bullets; (15) 3x3 Gauze; 15 pr Gloves Cleanser: Soap and Water 3 x Per Week/15 Days Discharge Instructions: Gently cleanse wound with antibacterial soap, rinse and pat dry prior to dressing wounds Cleanser: Wound Cleanser 3 x Per Week/15 Days Discharge Instructions: Wash your hands with soap and water. Remove old dressing, discard into plastic bag and place into trash. Cleanse the wound with Wound Cleanser prior to applying a clean dressing using gauze sponges, not tissues or cotton balls. Do not scrub or use excessive force. Pat dry using gauze sponges, not tissue or cotton balls. Primary Dressing: Iodosorb 40 (g) (Generic) 3 x Per Week/15 Days Discharge Instructions: Apply IodoSorb to wound bed only as directed. Secondary Dressing: Zetuvit Plus Silicone Border Dressing 4x4 (in/in) (DME) (Generic) 3 x Per Week/15 Days WOUND #6: - Lower Leg Wound Laterality: Right, Medial, Anterior Cleanser: Byram Ancillary Kit - 15 Day Supply (Generic) 3 x Per Week/15 Days Discharge Instructions: Use supplies as instructed; Kit contains: (15) Saline Bullets; (15) 3x3 Gauze; 15 pr Gloves Cleanser: Soap and Water 3 x Per Week/15 Days Discharge Instructions: Gently cleanse wound with antibacterial soap, rinse and pat dry prior to dressing wounds Cleanser: Wound Cleanser 3 x Per Week/15 Days Discharge Instructions: Wash your hands with soap and water. Remove old dressing, discard into plastic bag and place into trash. Cleanse the wound with Wound Cleanser prior to applying a clean dressing using gauze sponges, not tissues or cotton balls. Do not  scrub or use excessive force. Pat dry using gauze sponges, not tissue or cotton balls. Primary Dressing: Iodosorb 40 (g) (Generic) 3 x Per Week/15 Days Discharge Instructions: Apply IodoSorb to wound bed only as directed. Secondary Dressing: Zetuvit Plus Silicone Border Dressing 4x4 (in/in) (DME) (Generic) 3 x Per Week/15 Days 1. Would recommend currently that we actually go ahead and continue with the Iodoflex. I did perform debridement on the right leg medial ankle region. There was some bleeding  I did use a little silver nitrate to chemically cauterize this. However I was finally able to get a lot of the slough off XZAYVION, VAETH F. ([id number]) which was good news. 2. We will get a continue with the Iodoflex since that seems to be doing a great job for him I see no reason to change. We will see patient back for reevaluation in 1 week here in the clinic. If anything worsens or changes patient will contact our office for additional recommendations. Electronic Signature(s) Signed: 05/07/2021 9:24:05 AM By: Worthy Keeler PA-C Entered By: Worthy Keeler on 05/07/2021 09:24:05 [name] ([id number]) -------------------------------------------------------------------------------- SuperBill Details Patient Name: [name]. Date of Service: 05/07/2021 Medical Record Number: [id number] Patient Account Number: [id number] Date of Birth/Sex: 11/13/1960 (61 y.o. M) Treating RN: Donnamarie Poag Primary Care Provider: [name] Other Clinician: Referring Provider: [name] Treating Provider/Extender: Skipper Cliche in Treatment: 3 Diagnosis Coding ICD-10 Codes Code Description [phone] Type 2 diabetes mellitus with other skin ulcer I89.0 Lymphedema, not elsewhere classified L97.822 Non-pressure chronic ulcer of other part of left lower leg with fat layer exposed L97.812 Non-pressure chronic ulcer of other part of right lower leg with fat layer exposed I87.312 Chronic venous  hypertension (idiopathic) with ulcer of left lower extremity I87.311 Chronic venous hypertension (idiopathic) with ulcer of right lower extremity Z94.0 Kidney transplant status Facility Procedures CPT4 Code: [id number] Description: 91660 - DEB SUBQ TISSUE 20 SQ CM/< Modifier: Quantity: 1 CPT4 Code: Description: ICD-10 Diagnosis Description L97.822 Non-pressure chronic ulcer of other part of left lower leg with fat laye L97.812 Non-pressure chronic ulcer of other part of right lower leg with fat lay Modifier: r exposed er exposed Quantity: Physician Procedures CPT4 Code: 6004599 Description: 11042 - WC PHYS SUBQ TISS 20 SQ CM Modifier: Quantity: 1 CPT4 Code: Description: ICD-10 Diagnosis Description L97.822 Non-pressure chronic ulcer of other part of left lower leg with fat laye L97.812 Non-pressure chronic ulcer of other part of right lower leg with fat lay Modifier: r exposed er exposed Quantity: Electronic Signature(s) Signed: 05/07/2021 9:24:34 AM By: Worthy Keeler PA-C Entered By: Worthy Keeler on 05/07/2021 09:24:33

## 2021-05-08 DIAGNOSIS — S81809A Unspecified open wound, unspecified lower leg, initial encounter: Secondary | ICD-10-CM | POA: Diagnosis not present

## 2021-05-09 ENCOUNTER — Encounter: Payer: Self-pay | Admitting: *Deleted

## 2021-05-11 ENCOUNTER — Telehealth: Payer: Self-pay | Admitting: *Deleted

## 2021-05-11 ENCOUNTER — Other Ambulatory Visit
Admission: RE | Admit: 2021-05-11 | Discharge: 2021-05-11 | Disposition: A | Discharge status: Home / Self Care | Payer: 59 | Source: Ambulatory Visit | Attending: Nurse Practitioner | Admitting: Nurse Practitioner

## 2021-05-11 DIAGNOSIS — I482 Chronic atrial fibrillation, unspecified: Secondary | ICD-10-CM | POA: Diagnosis not present

## 2021-05-11 DIAGNOSIS — E876 Hypokalemia: Secondary | ICD-10-CM | POA: Diagnosis present

## 2021-05-11 DIAGNOSIS — I5042 Chronic combined systolic (congestive) and diastolic (congestive) heart failure: Secondary | ICD-10-CM

## 2021-05-11 LAB — POTASSIUM: Potassium: 3.4 mmol/L — ABNORMAL LOW (ref 3.5–5.1)

## 2021-05-11 NOTE — Telephone Encounter (Signed)
-----  Message from Theora Gianotti, NP sent at 05/11/2021  3:21 PM EST ----- Potassium improving but still low - now 3.4.  Please have him increase to 10 tablets twice daily.  (Notes indicate he was taking 2 bid previously and we increased to 9 bid on 2/13).  F/u potassium in a week.

## 2021-05-11 NOTE — Telephone Encounter (Signed)
Left voicemail message to call back for review of results and recommendations.  

## 2021-05-14 ENCOUNTER — Encounter: Payer: 59 | Admitting: Physician Assistant

## 2021-05-14 ENCOUNTER — Other Ambulatory Visit: Payer: Self-pay

## 2021-05-14 DIAGNOSIS — L97811 Non-pressure chronic ulcer of other part of right lower leg limited to breakdown of skin: Secondary | ICD-10-CM | POA: Diagnosis not present

## 2021-05-14 DIAGNOSIS — E11622 Type 2 diabetes mellitus with other skin ulcer: Secondary | ICD-10-CM | POA: Diagnosis not present

## 2021-05-14 DIAGNOSIS — I87311 Chronic venous hypertension (idiopathic) with ulcer of right lower extremity: Secondary | ICD-10-CM | POA: Diagnosis not present

## 2021-05-14 DIAGNOSIS — G4730 Sleep apnea, unspecified: Secondary | ICD-10-CM | POA: Diagnosis not present

## 2021-05-14 DIAGNOSIS — Z940 Kidney transplant status: Secondary | ICD-10-CM | POA: Diagnosis not present

## 2021-05-14 DIAGNOSIS — L97812 Non-pressure chronic ulcer of other part of right lower leg with fat layer exposed: Secondary | ICD-10-CM | POA: Diagnosis not present

## 2021-05-14 DIAGNOSIS — E1140 Type 2 diabetes mellitus with diabetic neuropathy, unspecified: Secondary | ICD-10-CM | POA: Diagnosis not present

## 2021-05-14 DIAGNOSIS — I890 Lymphedema, not elsewhere classified: Secondary | ICD-10-CM | POA: Diagnosis not present

## 2021-05-14 DIAGNOSIS — L97822 Non-pressure chronic ulcer of other part of left lower leg with fat layer exposed: Secondary | ICD-10-CM | POA: Diagnosis not present

## 2021-05-14 DIAGNOSIS — E1122 Type 2 diabetes mellitus with diabetic chronic kidney disease: Secondary | ICD-10-CM | POA: Diagnosis not present

## 2021-05-14 MED ORDER — POTASSIUM CHLORIDE ER 8 MEQ PO CPCR
80.0000 meq | ORAL_CAPSULE | Freq: Two times a day (BID) | ORAL | 1 refills | Status: DC
  Filled 2021-05-14: qty 1800, 90d supply, fill #0

## 2021-05-14 MED FILL — Apixaban Tab 5 MG: ORAL | 90 days supply | Qty: 180 | Fill #1 | Status: AC

## 2021-05-14 NOTE — Progress Notes (Signed)
[name], [name] ([id number]) Visit Report for 05/14/2021 Arrival Information Details Patient Name: [name], [name]. Date of Service: 05/14/2021 8:30 AM Medical Record Number: [id number] Patient Account Number: [id number] Date of Birth/Sex: 11-16-1960 (61 y.o. M) Treating RN: Donnamarie Poag Primary Care Chelesea Weiand: [name] Other Clinician: Referring Quadry Kampa: [name] Treating Taylr Meuth/Extender: Skipper Cliche in Treatment: 4 Visit Information History Since Last Visit Added or deleted any medications: No Patient Arrived: Ambulatory Had a fall or experienced change in No Arrival Time: 08:29 activities of daily living that may affect Accompanied By: self risk of falls: Transfer Assistance: None Hospitalized since last visit: No Patient Identification Verified: Yes Has Dressing in Place as Prescribed: Yes Secondary Verification Process Completed: Yes Pain Present Now: No Patient Requires Transmission-Based No Precautions: Patient Has Alerts: Yes Patient Alerts: Patient on Blood Thinner DIABETIC [place] left leg 04/16/21 [facility]IQUIS AVVS 05/02/21 [place] ABI TBI L 0.90; R 1.03 Electronic Signature(s) Signed: 05/14/2021 4:23:33 PM By: Donnamarie Poag Entered By: Donnamarie Poag on 05/14/2021 08:33:06 Neubecker, Dorothea Glassman ([id number]) -------------------------------------------------------------------------------- Clinic Level of Care Assessment Details Patient Name: [name]. Date of Service: 05/14/2021 8:30 AM Medical Record Number: [id number] Patient Account Number: [id number] Date of Birth/Sex: 18-Oct-1960 (61 y.o. M) Treating RN: Donnamarie Poag Primary Care Keiondre Colee: [name] Other Clinician: Referring Vercie Pokorny: [name] Treating Aaliyha Mumford/Extender: Skipper Cliche in Treatment: 4 Clinic Level of Care Assessment Items TOOL 4 Quantity Score _0  - Use when only an EandM is performed on FOLLOW-UP visit 0 ASSESSMENTS - Nursing Assessment / Reassessment _1  - Reassessment of  Co-morbidities (includes updates in patient status) 0 _2  - 0 Reassessment of Adherence to Treatment Plan ASSESSMENTS - Wound and Skin Assessment / Reassessment _3  - Simple Wound Assessment / Reassessment - one wound 0 X- 3 5 Complex Wound Assessment / Reassessment - multiple wounds _4  - 0 Dermatologic / Skin Assessment (not related to wound area) ASSESSMENTS - Focused Assessment _5  - Circumferential Edema Measurements - multi extremities 0 _6  - 0 Nutritional Assessment / Counseling / Intervention _7  - 0 Lower Extremity Assessment (monofilament, tuning fork, pulses) _8  - 0 Peripheral Arterial Disease Assessment (using hand held doppler) ASSESSMENTS - Ostomy and/or Continence Assessment and Care _9  - Incontinence Assessment and Management 0 _10  - 0 Ostomy Care Assessment and Management (repouching, etc.) PROCESS - Coordination of Care X - Simple Patient / Family Education for ongoing care 1 15 _11  - 0 Complex (extensive) Patient / Family Education for ongoing care _12  - 0 Staff obtains Programmer, systems, Records, Test Results / Process Orders _13  - 0 Staff telephones HHA, Nursing Homes / Clarify orders / etc _14  - 0 Routine Transfer to another Facility (non-emergent condition) _15  - 0 Routine Hospital Admission (non-emergent condition) _16  - 0 New Admissions / Biomedical engineer / Ordering NPWT, Apligraf, etc. _17  - 0 Emergency Hospital Admission (emergent condition) X- 1 10 Simple Discharge Coordination _18  - 0 Complex (extensive) Discharge Coordination PROCESS - Special Needs _19  - Pediatric / Minor Patient Management 0 _20  - 0 Isolation Patient Management _21  - 0 Hearing / Language / Visual special needs _22  - 0 Assessment of Community assistance (transportation, D/C planning, etc.) _23  - 0 Additional assistance / Altered mentation _24  - 0 Support Surface(s) Assessment (bed, cushion, seat, etc.) INTERVENTIONS - Wound Cleansing / Measurement Zarazua, Ryett F. ([id number]) _25  -  0 Simple Wound Cleansing - one wound X- 3 5 Complex Wound Cleansing - multiple wounds X- 1 5 Wound Imaging (photographs - any number of wounds) _26  - 0 Wound  Tracing (instead of photographs) _0  - 0 Simple Wound Measurement - one wound X- 3 5 Complex Wound Measurement - multiple wounds INTERVENTIONS - Wound Dressings X - Small Wound Dressing one or multiple wounds 3 10 _1  - 0 Medium Wound Dressing one or multiple wounds _2  - 0 Large Wound Dressing one or multiple wounds X- 1 5 Application of Medications - topical <ZOXWRUEAVWUJWJXB>_1<\/YNWGNFAOZHYQMVHQ>_4  - 0 Application of Medications - injection INTERVENTIONS - Miscellaneous _4  - External ear exam 0 _5  - 0 Specimen Collection (cultures, biopsies, blood, body fluids, etc.) _6  - 0 Specimen(s) / Culture(s) sent or taken to Lab for analysis _7  - 0 Patient Transfer (multiple staff / Civil Service fast streamer / Similar devices) _8  - 0 Simple Staple / Suture removal (25 or less) _9  - 0 Complex Staple / Suture removal (26 or more) _10  - 0 Hypo / Hyperglycemic Management (close monitor of Blood Glucose) _11  - 0 Ankle / Brachial Index (ABI) - do not check if billed separately X- 1 5 Vital Signs Has the patient been seen at the hospital within the last three years: Yes Total Score: 115 Level Of Care: New/Established - Level 3 Electronic Signature(s) Signed: 05/14/2021 4:23:33 PM By: Donnamarie Poag Entered By: Donnamarie Poag on 05/14/2021 09:06:21 Kantor, Dorothea Glassman ([id number]) -------------------------------------------------------------------------------- Encounter Discharge Information Details Patient Name: [name]. Date of Service: 05/14/2021 8:30 AM Medical Record Number: [id number] Patient Account Number: [id number] Date of Birth/Sex: Jan 01, 1961 (61 y.o. M) Treating RN: Donnamarie Poag Primary Care Laszlo Ellerby: [name] Other Clinician: Referring Mrytle Bento: [name] Treating Gracin Soohoo/Extender: Skipper Cliche in Treatment: 4 Encounter Discharge Information  Items Discharge Condition: Stable Ambulatory Status: Ambulatory Discharge Destination: Home Transportation: Private Auto Accompanied By: self Schedule Follow-up Appointment: Yes Clinical Summary of Care: Electronic Signature(s) Signed: 05/14/2021 4:23:33 PM By: Donnamarie Poag Entered By: Donnamarie Poag on 05/14/2021 09:11:12 [place], Dorothea Glassman ([id number]) -------------------------------------------------------------------------------- Lower Extremity Assessment Details Patient Name: [name] Date of Service: 05/14/2021 8:30 AM Medical Record Number: [id number] Patient Account Number: [id number] Date of Birth/Sex: February 01, 1961 (61 y.o. M) Treating RN: Donnamarie Poag Primary Care Maurica Omura: [name] Other Clinician: Referring Deniz Eskridge: [name] Treating Kynadie Yaun/Extender: Skipper Cliche in Treatment: 4 Edema Assessment Assessed: Shirlyn Goltz: Yes] Patrice Paradise: Yes] [Left: Edema] [Right: :] Calf Left: Right: Point of Measurement: 30 cm From Medial Instep 39 cm 39 cm Ankle Left: Right: Point of Measurement: 10 cm From Medial Instep 24.3 cm 24 cm Knee To Floor Left: Right: From Medial Instep 40 cm 40 cm Vascular Assessment Pulses: Dorsalis Pedis Palpable: [Left:Yes] [Right:Yes] Electronic Signature(s) Signed: 05/14/2021 4:23:33 PM By: Donnamarie Poag Entered By: Donnamarie Poag on 05/14/2021 08:43:51 Olesen, Shameer F. ([id number]) -------------------------------------------------------------------------------- Multi Wound Chart Details Patient Name: [name] Date of Service: 05/14/2021 8:30 AM Medical Record Number: [id number] Patient Account Number: [id number] Date of Birth/Sex: Jul 27, 1960 (61 y.o. M) Treating RN: Donnamarie Poag Primary Care Jayshun Galentine: [name] Other Clinician: Referring Daneen Volcy: [name] Treating Adilene Areola/Extender: Skipper Cliche in Treatment: 4 Vital Signs Height(in): 70 Pulse(bpm): 33 Weight(lbs): 270 Blood Pressure(mmHg): 159/84 Body Mass  Index(BMI): 38.7 Temperature(F): 97.8 Respiratory Rate(breaths/min): 16 Photos: Wound Location: Left, Anterior Lower Leg Right, Medial, Anterior Lower Leg Right, Medial, Anterior Lower Leg Wounding Event: Blister Blister Gradually Appeared Primary Etiology: Venous Leg Ulcer Venous Leg Ulcer Venous Leg Ulcer Comorbid History: Cataracts, Lymphedema, Asthma, Cataracts, Lymphedema, Asthma, Cataracts, Lymphedema, Asthma, Sleep Apnea, Arrhythmia, Sleep Apnea, Arrhythmia, Sleep Apnea, Arrhythmia, Congestive Heart Failure, Coronary Congestive Heart Failure, Coronary Congestive Heart Failure, Coronary Artery Disease, Hypertension, Type Artery  Disease, Hypertension, Type Artery Disease, Hypertension, Type II Diabetes, End Stage Renal II Diabetes, End Stage Renal II Diabetes, End Stage Renal Disease, Gout, Osteoarthritis, Disease, Gout, Osteoarthritis, Disease, Gout, Osteoarthritis, Neuropathy Neuropathy Neuropathy Date Acquired: 03/08/2021 03/08/2021 05/04/2021 Weeks of Treatment: _0 Wound Status: Open Open Open Wound Recurrence: No No No Measurements L x W x D (cm) 0.1x0.1x0.1 1x0.9x0.1 2.2x1.5x0.1 Area (cm) : 0.008 0.707 2.592 Volume (cm) : 0.001 0.071 0.259 % Reduction in Area: 99.80% 68.40% 30.70% % Reduction in Volume: 99.80% 68.30% 30.70% Classification: Full Thickness Without Exposed Full Thickness Without Exposed Partial Thickness Support Structures Support Structures Exudate Amount: None Present Medium Medium Exudate Type: N/A Serosanguineous Serosanguineous Exudate Color: N/A red, brown red, brown Granulation Amount: None Present (0%) Medium (34-66%) Large (67-100%) Granulation Quality: N/A Red, Hyper-granulation Red, Hyper-granulation Necrotic Amount: None Present (0%) Medium (34-66%) Small (1-33%) Exposed Structures: Fascia: No Fat Layer (Subcutaneous Tissue): Fascia: No Fat Layer (Subcutaneous Tissue): Yes Fat Layer (Subcutaneous Tissue): No Fascia: No No Tendon:  No Tendon: No Tendon: No Muscle: No Muscle: No Muscle: No Joint: No Joint: No Joint: No Bone: No Bone: No Bone: No Limited to Skin Breakdown Treatment Notes Electronic Signature(s) Signed: 05/14/2021 4:23:33 PM By: Hassan Rowan ([id number]) Entered By: Donnamarie Poag on 05/14/2021 08:59:50 Wilner, Dorothea Glassman ([id number]) -------------------------------------------------------------------------------- [place] Details Patient Name: COLLAN, SCHOENFELD. Date of Service: 05/14/2021 8:30 AM Medical Record Number: [id number] Patient Account Number: [id number] Date of Birth/Sex: 1960-06-29 (61 y.o. M) Treating RN: Donnamarie Poag Primary Care Tara Rud: [name] Other Clinician: Referring Folashade Gamboa: [name] Treating Quanasia Defino/Extender: Skipper Cliche in Treatment: 4 Active Inactive Wound/Skin Impairment Nursing Diagnoses: Impaired tissue integrity Knowledge deficit related to smoking impact on wound healing Knowledge deficit related to ulceration/compromised skin integrity Goals: Patient/caregiver will verbalize understanding of skin care regimen Date Initiated: 04/16/2021 Date Inactivated: 04/24/2021 Target Resolution Date: 04/27/2021 Goal Status: Met Ulcer/skin breakdown will have a volume reduction of 30% by week 4 Date Initiated: 04/16/2021 Date Inactivated: 04/30/2021 Target Resolution Date: 05/14/2021 Goal Status: Met Ulcer/skin breakdown will have a volume reduction of 50% by week 8 Date Initiated: 04/16/2021 Target Resolution Date: 06/11/2021 Goal Status: Active Ulcer/skin breakdown will have a volume reduction of 80% by week 12 Date Initiated: 04/16/2021 Target Resolution Date: 07/09/2021 Goal Status: Active Ulcer/skin breakdown will heal within 14 weeks Date Initiated: 04/16/2021 Target Resolution Date: 07/23/2021 Goal Status: Active Interventions: Assess patient/caregiver ability to obtain necessary supplies Assess patient/caregiver  ability to perform ulcer/skin care regimen upon admission and as needed Assess ulceration(s) every visit Notes: Electronic Signature(s) Signed: 05/14/2021 4:23:33 PM By: Donnamarie Poag Entered By: Donnamarie Poag on 05/14/2021 08:44:10 [place], Dorothea Glassman ([id number]) -------------------------------------------------------------------------------- Pain Assessment Details Patient Name: [name]. Date of Service: 05/14/2021 8:30 AM Medical Record Number: [id number] Patient Account Number: [id number] Date of Birth/Sex: 02-Feb-1961 (61 y.o. M) Treating RN: Donnamarie Poag Primary Care Debra Calabretta: [name] Other Clinician: Referring Shateria Paternostro: [name] Treating Eva Griffo/Extender: Skipper Cliche in Treatment: 4 Active Problems Location of Pain Severity and Description of Pain Patient Has Paino No Site Locations Rate the pain. Current Pain Level: 0 Pain Management and Medication Current Pain Management: Electronic Signature(s) Signed: 05/14/2021 4:23:33 PM By: Donnamarie Poag Entered By: Donnamarie Poag on 05/14/2021 08:35:30 Stgermain, Dorothea Glassman ([id number]) -------------------------------------------------------------------------------- Patient/Caregiver Education Details Patient Name: [name]. Date of Service: 05/14/2021 8:30 AM Medical Record Number: [id number] Patient Account Number: [id number] Date of Birth/Gender: Dec 10, 1960 (61 y.o. M) Treating RN: Donnamarie Poag Primary Care  Physician: [name] Other Clinician: Referring Physician: [name] Treating Physician/Extender: Skipper Cliche in Treatment: 4 Education Assessment Education Provided To: Patient Education Topics Provided Basic Hygiene: Wound/Skin Impairment: Electronic Signature(s) Signed: 05/14/2021 4:23:33 PM By: Donnamarie Poag Entered By: Donnamarie Poag on 05/14/2021 09:06:40 [name] ([id number]) -------------------------------------------------------------------------------- Wound Assessment  Details Patient Name: [name] Date of Service: 05/14/2021 8:30 AM Medical Record Number: [id number] Patient Account Number: [id number] Date of Birth/Sex: 02-01-1961 (61 y.o. M) Treating RN: Donnamarie Poag Primary Care Jamil Castillo: [name] Other Clinician: Referring Jiraiya Mcewan: [name] Treating Reagann Dolce/Extender: Skipper Cliche in Treatment: 4 Wound Status Wound Number: 3 Primary Venous Leg Ulcer Etiology: Wound Location: Left, Anterior Lower Leg Wound Healed - Epithelialized Wounding Event: Blister Status: Date Acquired: 03/08/2021 Comorbid Cataracts, Lymphedema, Asthma, Sleep Apnea, Weeks Of Treatment: 4 History: Arrhythmia, Congestive Heart Failure, Coronary Artery Clustered Wound: No Disease, Hypertension, Type II Diabetes, End Stage Renal Disease, Gout, Osteoarthritis, Neuropathy Photos Wound Measurements Length: (cm) 0 Width: (cm) 0 Depth: (cm) 0 Area: (cm) 0 Volume: (cm) 0 % Reduction in Area: 100% % Reduction in Volume: 100% Tunneling: No Undermining: No Wound Description Classification: Full Thickness Without Exposed Support Structure Exudate Amount: None Present s Foul Odor After Cleansing: No Slough/Fibrino No Wound Bed Granulation Amount: None Present (0%) Exposed Structure Necrotic Amount: None Present (0%) Fascia Exposed: No Fat Layer (Subcutaneous Tissue) Exposed: No Tendon Exposed: No Muscle Exposed: No Joint Exposed: No Bone Exposed: No Treatment Notes Wound #3 (Lower Leg) Wound Laterality: Left, Anterior Cleanser Peri-Wound Care Topical Primary Dressing IRMA, DELANCEY ([id number]) Secondary Dressing Secured With Compression Wrap Compression Stockings Add-Ons Electronic Signature(s) Signed: 05/14/2021 4:23:33 PM By: Donnamarie Poag Entered By: Donnamarie Poag on 05/14/2021 09:01:03 Frate, Dorothea Glassman ([id number]) -------------------------------------------------------------------------------- Wound Assessment Details Patient  Name: [name] Date of Service: 05/14/2021 8:30 AM Medical Record Number: [id number] Patient Account Number: [id number] Date of Birth/Sex: Feb 12, 1961 (61 y.o. M) Treating RN: Donnamarie Poag Primary Care Zuri Lascala: [name] Other Clinician: Referring Ardena Gangl: [name] Treating Cashton Hosley/Extender: Skipper Cliche in Treatment: 4 Wound Status Wound Number: 4 Primary Venous Leg Ulcer Etiology: Wound Location: Right, Medial, Anterior Lower Leg Wound Open Wounding Event: Blister Status: Date Acquired: 03/08/2021 Comorbid Cataracts, Lymphedema, Asthma, Sleep Apnea, Weeks Of Treatment: 4 History: Arrhythmia, Congestive Heart Failure, Coronary Artery Clustered Wound: No Disease, Hypertension, Type II Diabetes, End Stage Renal Disease, Gout, Osteoarthritis, Neuropathy Photos Wound Measurements Length: (cm) 1 Width: (cm) 0.9 Depth: (cm) 0.1 Area: (cm) 0.707 Volume: (cm) 0.071 % Reduction in Area: 68.4% % Reduction in Volume: 68.3% Wound Description Classification: Full Thickness Without Exposed Support Structures Exudate Amount: Medium Exudate Type: Serosanguineous Exudate Color: red, brown Foul Odor After Cleansing: No Slough/Fibrino Yes Wound Bed Granulation Amount: Medium (34-66%) Exposed Structure Granulation Quality: Red, Hyper-granulation Fascia Exposed: No Necrotic Amount: Medium (34-66%) Fat Layer (Subcutaneous Tissue) Exposed: Yes Necrotic Quality: Adherent Slough Tendon Exposed: No Muscle Exposed: No Joint Exposed: No Bone Exposed: No Treatment Notes Wound #4 (Lower Leg) Wound Laterality: Right, Medial, Anterior Cleanser Byram Ancillary Kit - 15 Day Supply Discharge Instruction: Use supplies as instructed; Kit contains: (15) Saline Bullets; (15) 3x3 Gauze; 15 pr Gloves Soap and [address] BILAL, MANZER F. ([id number]) Discharge Instruction: Gently cleanse wound with antibacterial soap, rinse and pat dry prior to dressing wounds Wound  Cleanser Discharge Instruction: Wash your hands with soap and water. Remove old dressing, discard into plastic bag and place into trash. Cleanse the wound with Wound Cleanser prior to applying a clean  dressing using gauze sponges, not tissues or cotton balls. Do not scrub or use excessive force. Pat dry using gauze sponges, not tissue or cotton balls. Peri-Wound Care Topical Primary Dressing Iodosorb 40 (g) Discharge Instruction: Apply IodoSorb to wound bed only as directed. Secondary Dressing Zetuvit Plus Silicone Border Dressing 4x4 (in/in) Secured With Compression Wrap Compression Stockings Add-Ons Electronic Signature(s) Signed: 05/14/2021 4:23:33 PM By: Donnamarie Poag Entered By: Donnamarie Poag on 05/14/2021 08:41:44 Vien, Jaris F. ([id number]) -------------------------------------------------------------------------------- Wound Assessment Details Patient Name: [name] Date of Service: 05/14/2021 8:30 AM Medical Record Number: [id number] Patient Account Number: [id number] Date of Birth/Sex: 06-11-1960 (61 y.o. M) Treating RN: Donnamarie Poag Primary Care Namine Beahm: [name] Other Clinician: Referring Alany Borman: [name] Treating Sorin Frimpong/Extender: Skipper Cliche in Treatment: 4 Wound Status Wound Number: 6 Primary Venous Leg Ulcer Etiology: Wound Location: Right, Medial, Anterior Lower Leg Wound Open Wounding Event: Gradually Appeared Status: Date Acquired: 05/04/2021 Comorbid Cataracts, Lymphedema, Asthma, Sleep Apnea, Weeks Of Treatment: 1 History: Arrhythmia, Congestive Heart Failure, Coronary Artery Clustered Wound: No Disease, Hypertension, Type II Diabetes, End Stage Renal Disease, Gout, Osteoarthritis, Neuropathy Photos Wound Measurements Length: (cm) 2.2 Width: (cm) 1.5 Depth: (cm) 0.1 Area: (cm) 2.592 Volume: (cm) 0.259 % Reduction in Area: 30.7% % Reduction in Volume: 30.7% Wound Description Classification: Partial Thickness Exudate  Amount: Medium Exudate Type: Serosanguineous Exudate Color: red, brown Foul Odor After Cleansing: No Slough/Fibrino Yes Wound Bed Granulation Amount: Large (67-100%) Exposed Structure Granulation Quality: Red, Hyper-granulation Fascia Exposed: No Necrotic Amount: Small (1-33%) Fat Layer (Subcutaneous Tissue) Exposed: No Necrotic Quality: Adherent Slough Tendon Exposed: No Muscle Exposed: No Joint Exposed: No Bone Exposed: No Limited to Skin Breakdown Treatment Notes Wound #6 (Lower Leg) Wound Laterality: Right, Medial, Anterior Cleanser Byram Ancillary Kit - 15 Day Supply Discharge Instruction: Use supplies as instructed; Kit contains: (15) Saline Bullets; (15) 3x3 Gauze; 15 pr Gloves Loux, Ronzell F. ([id number]) Soap and Water Discharge Instruction: Gently cleanse wound with antibacterial soap, rinse and pat dry prior to dressing wounds Wound Cleanser Discharge Instruction: Wash your hands with soap and water. Remove old dressing, discard into plastic bag and place into trash. Cleanse the wound with Wound Cleanser prior to applying a clean dressing using gauze sponges, not tissues or cotton balls. Do not scrub or use excessive force. Pat dry using gauze sponges, not tissue or cotton balls. Peri-Wound Care Topical Primary Dressing Iodosorb 40 (g) Discharge Instruction: Apply IodoSorb to wound bed only as directed. Secondary Dressing Zetuvit Plus Silicone Border Dressing 4x4 (in/in) Secured With Compression Wrap Compression Stockings Add-Ons Electronic Signature(s) Signed: 05/14/2021 4:23:33 PM By: Donnamarie Poag Entered By: Donnamarie Poag on 05/14/2021 08:42:27 Peralta, Dorothea Glassman ([id number]) -------------------------------------------------------------------------------- [place] Details Patient Name: [name]. Date of Service: 05/14/2021 8:30 AM Medical Record Number: [id number] Patient Account Number: [id number] Date of Birth/Sex: 07/19/1960 (61 y.o. M) Treating RN:  Donnamarie Poag Primary Care Shaughnessy Gethers: [name] Other Clinician: Referring Alieah Brinton: [name] Treating Alaria Oconnor/Extender: Skipper Cliche in Treatment: 4 Vital Signs Time Taken: 08:33 Temperature (F): 97.8 Height (in): 70 Pulse (bpm): 85 Weight (lbs): 270 Respiratory Rate (breaths/min): 16 Body Mass Index (BMI): 38.7 Blood Pressure (mmHg): 159/84 Reference Range: 80 - 120 mg / dl Electronic Signature(s) Signed: 05/14/2021 4:23:33 PM By: Donnamarie Poag Entered ByDonnamarie Poag on 05/14/2021 08:34:49

## 2021-05-14 NOTE — Progress Notes (Addendum)
[name], [name] ([id number]) Visit Report for 05/14/2021 Chief Complaint Document Details Patient Name: [name], [name]. Date of Service: 05/14/2021 8:30 AM Medical Record Number: [id number] Patient Account Number: [id number] Date of Birth/Sex: 1960/10/01 (61 y.o. M) Treating RN: Donnamarie Poag Primary Care Provider: [name] Other Clinician: Referring Provider: [name] Treating Provider/Extender: Skipper Cliche in Treatment: 4 Information Obtained from: Patient Chief Complaint Bilateral [name] Ulcers Electronic Signature(s) Signed: 05/14/2021 8:49:01 AM By: Worthy Keeler PA-C Entered By: Worthy Keeler on 05/14/2021 08:49:00 Finch, Dorothea Glassman ([id number]) -------------------------------------------------------------------------------- HPI Details Patient Name: [name], [name]. Date of Service: 05/14/2021 8:30 AM Medical Record Number: [id number] Patient Account Number: [id number] Date of Birth/Sex: 1960/03/27 (61 y.o. M) Treating RN: Donnamarie Poag Primary Care Provider: [name] Other Clinician: Referring Provider: [name] Treating Provider/Extender: Skipper Cliche in Treatment: 4 History of Present Illness Location: left anterior shin and left medial calf Quality: Patient reports experiencing a dull pain to affected area(s). Severity: Patient states wound are getting worse. Duration: Patient has had the wound for > 3 months prior to seeking treatment at the wound center Timing: Pain in wound is Intermittent (comes and goes Context: The wound appeared gradually over time Modifying Factors: Other treatment(s) tried include:had surgery on the left anterior shin for a hematoma which has been nonhealing for the last 8 months Associated Signs and Symptoms: Patient reports having increase swelling. HPI Description: 61 year old diabetic was last hemoglobin A1c was 7.2% has been reviewed today with 2 ulcerated areas in his left lower extremity which she's had for  about 8 months and the one most recently was started as a blister for 2 weeks. he has a past medical history of atrial fibrillation, coronary artery disease, CHF, chronic venous stasis dermatitis, diabetes mellitus, hypertension, sleep apnea and is status post kidney transplant in 2012. He is a former smoker and quit cigars in 1996. In the remote past he's had some arterial and venous duplex studies done but does not recall the results no was ever treated for venous insufficiency. 06/10/2016 -- -- had a office visit at [facility] seen by the physicianos assistant [facility]. Patient underwent an ABI which showed medial calcification, normal tibial artery Doppler waveform, PPG waveforms and toe brachial indices suggest normal arterial perfusion to bilateral lower extremities. right TBI was 0.97 and left was 0.84 Bilateral venous duplex was notable for venous reflux in the left greater saphenous vein and popliteal veins.. A left lower extremity endovenous laser ablation was recommended. 06/27/2016 -- I understand he spoke to his insurance company and there is a 3 month waiting period with conservative therapy prior to authorizing surgical intervention. 07/12/16- he is here for follow-up evaluation of a left lower extremity ulcer. He did bring his compression stockings today. He is voicing no complaints or concerns Readmission: 04/16/2021 upon evaluation today patient appears for reevaluation here in the clinic he was last seen in April 2018. At that time he was having issues with venous stasis ulcers and that again is what he is dealing with today as well. Fortunately I do not see any signs of active infection at this time which is great news and overall very pleased in that regard. Nonetheless he does have some areas of eschar which is going require debridement to clear away some of the necrotic debris so that we can hopefully get this healed as quickly as possible. That was  discussed with the patient today as well. The patient does have diabetes mellitus  type 2 for which she does have a hemoglobin A1c of 7.27 November 2020. He also has been on Keflex for what was felt to be cellulitis that seems to be doing better these wounds began on March 08, 2021. The patient also has a history of lymphedema, chronic venous insufficiency, and is a kidney transplant patient. 04/24/2021 upon evaluation today patient appears to be doing okay in regard to his wounds. Fortunately there does not appear to be any evidence of active infection locally nor systemically at this time which is good news. Nonetheless he was not able to keep the compression wraps on I think this is going be a little bit more problem he tells me his anxiety was to the point he just was not able to do it. I do understand but at the same time that is good to make things somewhat difficult to be honest. 04/30/2021 upon evaluation today patient appears to be doing well with regard to his wounds on his legs. Fortunately I do not see any signs of active infection locally or systemically at this time which is great news. No fevers, chills, nausea, vomiting, or diarrhea. 05/07/2021 upon evaluation patient appears to be doing well with regard to his wounds. He does have a new area on the right leg due to a skin tear as he was pulling up his compression socks. This obviously is a little frustrating for him everything else seems to be doing much better his blood flow was good I did print off the test today shows that he has excellent TBI's which is also news. 05/14/2021 upon evaluation today patient appears to be doing better in fact in regard to his left leg he is completely healed in regard to the right leg he still continues to have some open areas in 2 spots but both are doing better in my opinion which is great news. Fortunately I do not see any signs of active infection locally or systemically at this time which is great  news as well. Electronic Signature(s) Signed: 05/14/2021 9:32:09 AM By: Worthy Keeler PA-C Entered By: Worthy Keeler on 05/14/2021 09:32:08 [name], [name] ([id number]) -------------------------------------------------------------------------------- Physical Exam Details Patient Name: LELAN, CUSH. Date of Service: 05/14/2021 8:30 AM Medical Record Number: [id number] Patient Account Number: [id number] Date of Birth/Sex: 09/08/1960 (61 y.o. M) Treating RN: Donnamarie Poag Primary Care Provider: [name] Other Clinician: Referring Provider: [name] Treating Provider/Extender: Skipper Cliche in Treatment: 4 Constitutional Well-nourished and well-hydrated in no acute distress. Respiratory normal breathing without difficulty. Psychiatric this patient is able to make decisions and demonstrates good insight into disease process. Alert and Oriented x 3. pleasant and cooperative. Notes Upon inspection patient's wound bed actually showed signs of good granulation and epithelization at this point. Fortunately I think we are headed in the right direction and the patient does seem to be making excellent progress. I do not see any signs of active infection locally or systemically currently. Electronic Signature(s) Signed: 05/14/2021 9:32:26 AM By: Worthy Keeler PA-C Entered By: Worthy Keeler on 05/14/2021 09:32:26 [name], [name] ([id number]) -------------------------------------------------------------------------------- Physician Orders Details Patient Name: [name], [name]. Date of Service: 05/14/2021 8:30 AM Medical Record Number: [id number] Patient Account Number: [id number] Date of Birth/Sex: [birth year] (61 y.o. M) Treating RN: Donnamarie Poag Primary Care Provider: [name] Other Clinician: Referring Provider: [name] Treating Provider/Extender: Skipper Cliche in Treatment: 4 Verbal / Phone Orders: No Diagnosis Coding ICD-10 Coding Code  Description E11.622 Type 2 diabetes  mellitus with other skin ulcer I89.0 Lymphedema, not elsewhere classified L97.822 Non-pressure chronic ulcer of other part of left lower leg with fat layer exposed L97.812 Non-pressure chronic ulcer of other part of right lower leg with fat layer exposed I87.312 Chronic venous hypertension (idiopathic) with ulcer of left lower extremity I87.311 Chronic venous hypertension (idiopathic) with ulcer of right lower extremity Z94.0 Kidney transplant status Follow-up Appointments o Return Appointment in 1 week. o Nurse Visit as needed Bathing/ Shower/ Hygiene o May shower; gently cleanse wound with antibacterial soap, rinse and pat dry prior to dressing wounds o May shower with wound dressing protected with water repellent cover or cast protector. o No tub bath. Anesthetic (Use 'Patient Medications' Section for Anesthetic Order Entry) o Lidocaine applied to wound bed Edema Control - Lymphedema / Segmental Compressive Device / Other o Patient to wear own compression stockings. Remove compression stockings every night before going to bed and put on every morning when getting up. - refused compression wrap o Elevate leg(s) parallel to the floor when sitting. o DO YOUR BEST to sleep in the bed at night. DO NOT sleep in your recliner. Long hours of sitting in a recliner leads to swelling of the legs and/or potential wounds on your backside. Additional Orders / Instructions o Follow Nutritious Diet and Increase Protein Intake - continue to monitor blood sugar Wound Treatment Wound #4 - Lower Leg Wound Laterality: Right, Medial, Anterior Cleanser: Byram Ancillary Kit - 15 Day Supply (Generic) 3 x Per Week/15 Days Discharge Instructions: Use supplies as instructed; Kit contains: (15) Saline Bullets; (15) 3x3 Gauze; 15 pr Gloves Cleanser: Soap and Water 3 x Per Week/15 Days Discharge Instructions: Gently cleanse wound with antibacterial soap,  rinse and pat dry prior to dressing wounds Cleanser: Wound Cleanser 3 x Per Week/15 Days Discharge Instructions: Wash your hands with soap and water. Remove old dressing, discard into plastic bag and place into trash. Cleanse the wound with Wound Cleanser prior to applying a clean dressing using gauze sponges, not tissues or cotton balls. Do not scrub or use excessive force. Pat dry using gauze sponges, not tissue or cotton balls. Primary Dressing: Iodosorb 40 (g) (Generic) 3 x Per Week/15 Days Discharge Instructions: Apply IodoSorb to wound bed only as directed. Secondary Dressing: Zetuvit Plus Silicone Border Dressing 4x4 (in/in) (Generic) 3 x Per Week/15 Days Wound #6 - Lower Leg Wound Laterality: Right, Medial, Anterior Cleanser: Byram Ancillary Kit - 15 Day Supply (Generic) 3 x Per Week/15 Days Discharge Instructions: Use supplies as instructed; Kit contains: (15) Saline Bullets; (15) 3x3 Gauze; 15 pr Gloves Branscum, [name] ([id number]) Cleanser: Soap and Water 3 x Per Week/15 Days Discharge Instructions: Gently cleanse wound with antibacterial soap, rinse and pat dry prior to dressing wounds Cleanser: Wound Cleanser 3 x Per Week/15 Days Discharge Instructions: Wash your hands with soap and water. Remove old dressing, discard into plastic bag and place into trash. Cleanse the wound with Wound Cleanser prior to applying a clean dressing using gauze sponges, not tissues or cotton balls. Do not scrub or use excessive force. Pat dry using gauze sponges, not tissue or cotton balls. Primary Dressing: Iodosorb 40 (g) (Generic) 3 x Per Week/15 Days Discharge Instructions: Apply IodoSorb to wound bed only as directed. Secondary Dressing: Zetuvit Plus Silicone Border Dressing 4x4 (in/in) (Generic) 3 x Per Week/15 Days Electronic Signature(s) Signed: 05/14/2021 4:23:33 PM By: Donnamarie Poag Signed: 05/14/2021 5:14:22 PM By: Worthy Keeler PA-C Entered By: Donnamarie Poag on 05/14/2021 09:05:43 [name],  [name] ([id number]) -------------------------------------------------------------------------------- Problem List Details Patient Name: AVRIAN, DELFAVERO. Date of Service: 05/14/2021 8:30 AM Medical Record Number: [id number] Patient Account Number: [id number] Date of Birth/Sex: [birth year] (61 y.o. M) Treating RN: Donnamarie Poag Primary Care Provider: [name] Other Clinician: Referring Provider: [name] Treating Provider/Extender: Skipper Cliche in Treatment: 4 Active Problems ICD-10 Encounter Code Description Active Date MDM Diagnosis E11.622 Type 2 diabetes mellitus with other skin ulcer 04/16/2021 No Yes I89.0 Lymphedema, not elsewhere classified 04/16/2021 No Yes L97.822 Non-pressure chronic ulcer of other part of left lower leg with fat layer 04/16/2021 No Yes exposed L97.812 Non-pressure chronic ulcer of other part of right lower leg with fat layer 04/16/2021 No Yes exposed I87.312 Chronic venous hypertension (idiopathic) with ulcer of left lower 04/16/2021 No Yes extremity I87.311 Chronic venous hypertension (idiopathic) with ulcer of right lower 04/16/2021 No Yes extremity Z94.0 Kidney transplant status 04/16/2021 No Yes Inactive Problems Resolved Problems Electronic Signature(s) Signed: 05/14/2021 8:48:52 AM By: Worthy Keeler PA-C Entered By: Worthy Keeler on 05/14/2021 08:48:52 Dundon, Azaria F. ([id number]) -------------------------------------------------------------------------------- Progress Note Details Patient Name: Carl Speller F. Date of Service: 05/14/2021 8:30 AM Medical Record Number: [id number] Patient Account Number: [id number] Date of Birth/Sex: 1960/04/22 (61 y.o. M) Treating RN: Donnamarie Poag Primary Care Provider: [name] Other Clinician: Referring Provider: [name] Treating Provider/Extender: Skipper Cliche in Treatment: 4 Subjective Chief Complaint Information obtained from Patient Bilateral [name] Ulcers History of  Present Illness (HPI) The following HPI elements were documented for the patient's wound: Location: left anterior shin and left medial calf Quality: Patient reports experiencing a dull pain to affected area(s). Severity: Patient states wound are getting worse. Duration: Patient has had the wound for > 3 months prior to seeking treatment at the wound center Timing: Pain in wound is Intermittent (comes and goes Context: The wound appeared gradually over time Modifying Factors: Other treatment(s) tried include:had surgery on the left anterior shin for a hematoma which has been nonhealing for the last 8 months Associated Signs and Symptoms: Patient reports having increase swelling. 61 year old diabetic was last hemoglobin A1c was 7.2% has been reviewed today with 2 ulcerated areas in his left lower extremity which she's had for about 8 months and the one most recently was started as a blister for 2 weeks. he has a past medical history of atrial fibrillation, coronary artery disease, CHF, chronic venous stasis dermatitis, diabetes mellitus, hypertension, sleep apnea and is status post kidney transplant in 2012. He is a former smoker and quit cigars in 1996. In the remote past he's had some arterial and venous duplex studies done but does not recall the results no was ever treated for venous insufficiency. 06/10/2016 -- -- had a office visit at [facility] seen by the physician s assistant Marcelle Overlie. Patient underwent an ABI which showed medial calcification, normal tibial artery Doppler waveform, PPG waveforms and toe brachial indices suggest normal arterial perfusion to bilateral lower extremities. right TBI was 0.97 and left was 0.84 Bilateral venous duplex was notable for venous reflux in the left greater saphenous vein and popliteal veins.. A left lower extremity endovenous laser ablation was recommended. 06/27/2016 -- I understand he spoke to his insurance company and  there is a 3 month waiting period with conservative therapy prior to authorizing surgical intervention. 07/12/16- he is here for follow-up evaluation of a left lower extremity ulcer. He did bring his compression stockings today. He is voicing no complaints or concerns Readmission: 04/16/2021 upon  evaluation today patient appears for reevaluation here in the clinic he was last seen in April 2018. At that time he was having issues with venous stasis ulcers and that again is what he is dealing with today as well. Fortunately I do not see any signs of active infection at this time which is great news and overall very pleased in that regard. Nonetheless he does have some areas of eschar which is going require debridement to clear away some of the necrotic debris so that we can hopefully get this healed as quickly as possible. That was discussed with the patient today as well. The patient does have diabetes mellitus type 2 for which she does have a hemoglobin A1c of 7.27 November 2020. He also has been on Keflex for what was felt to be cellulitis that seems to be doing better these wounds began on March 08, 2021. The patient also has a history of lymphedema, chronic venous insufficiency, and is a kidney transplant patient. 04/24/2021 upon evaluation today patient appears to be doing okay in regard to his wounds. Fortunately there does not appear to be any evidence of active infection locally nor systemically at this time which is good news. Nonetheless he was not able to keep the compression wraps on I think this is going be a little bit more problem he tells me his anxiety was to the point he just was not able to do it. I do understand but at the same time that is good to make things somewhat difficult to be honest. 04/30/2021 upon evaluation today patient appears to be doing well with regard to his wounds on his legs. Fortunately I do not see any signs of active infection locally or systemically at this  time which is great news. No fevers, chills, nausea, vomiting, or diarrhea. 05/07/2021 upon evaluation patient appears to be doing well with regard to his wounds. He does have a new area on the right leg due to a skin tear as he was pulling up his compression socks. This obviously is a little frustrating for him everything else seems to be doing much better his blood flow was good I did print off the test today shows that he has excellent TBI's which is also news. 05/14/2021 upon evaluation today patient appears to be doing better in fact in regard to his left leg he is completely healed in regard to the right leg he still continues to have some open areas in 2 spots but both are doing better in my opinion which is great news. Fortunately I do not see any signs of active infection locally or systemically at this time which is great news as well. [name], [name] ([id number]) Objective Constitutional Well-nourished and well-hydrated in no acute distress. Vitals Time Taken: 8:33 AM, Height: 70 in, Weight: 270 lbs, BMI: 38.7, Temperature: 97.8 F, Pulse: 85 bpm, Respiratory Rate: 16 breaths/min, Blood Pressure: 159/84 mmHg. Respiratory normal breathing without difficulty. Psychiatric this patient is able to make decisions and demonstrates good insight into disease process. Alert and Oriented x 3. pleasant and cooperative. General Notes: Upon inspection patient's wound bed actually showed signs of good granulation and epithelization at this point. Fortunately I think we are headed in the right direction and the patient does seem to be making excellent progress. I do not see any signs of active infection locally or systemically currently. Integumentary (Hair, Skin) Wound #3 status is Healed - Epithelialized. Original cause of wound was Blister. The date acquired was: 03/08/2021. The wound  has been in treatment 4 weeks. The wound is located on the Left,Anterior Lower Leg. The wound measures 0cm length  x 0cm width x 0cm depth; 0cm^2 area and 0cm^3 volume. There is no tunneling or undermining noted. There is a none present amount of drainage noted. There is no granulation within the wound bed. There is no necrotic tissue within the wound bed. Wound #4 status is Open. Original cause of wound was Blister. The date acquired was: 03/08/2021. The wound has been in treatment 4 weeks. The wound is located on the Right,Medial,Anterior Lower Leg. The wound measures 1cm length x 0.9cm width x 0.1cm depth; 0.707cm^2 area and 0.071cm^3 volume. There is Fat Layer (Subcutaneous Tissue) exposed. There is a medium amount of serosanguineous drainage noted. There is medium (34-66%) red, hyper - granulation within the wound bed. There is a medium (34-66%) amount of necrotic tissue within the wound bed including Adherent Slough. Wound #6 status is Open. Original cause of wound was Gradually Appeared. The date acquired was: 05/04/2021. The wound has been in treatment 1 weeks. The wound is located on the Right,Medial,Anterior Lower Leg. The wound measures 2.2cm length x 1.5cm width x 0.1cm depth; 2.592cm^2 area and 0.259cm^3 volume. The wound is limited to skin breakdown. There is a medium amount of serosanguineous drainage noted. There is large (67-100%) red, hyper - granulation within the wound bed. There is a small (1-33%) amount of necrotic tissue within the wound bed including Adherent Slough. Assessment Active Problems ICD-10 Type 2 diabetes mellitus with other skin ulcer Lymphedema, not elsewhere classified Non-pressure chronic ulcer of other part of left lower leg with fat layer exposed Non-pressure chronic ulcer of other part of right lower leg with fat layer exposed Chronic venous hypertension (idiopathic) with ulcer of left lower extremity Chronic venous hypertension (idiopathic) with ulcer of right lower extremity Kidney transplant status Plan Follow-up Appointments: Return Appointment in 1  week. Nurse Visit as needed Bathing/ Shower/ Hygiene: May shower; gently cleanse wound with antibacterial soap, rinse and pat dry prior to dressing wounds May shower with wound dressing protected with water repellent cover or cast protector. No tub bath. Anesthetic (Use 'Patient Medications' Section for Anesthetic Order Entry): [name], LEEDY. ([id number]) Lidocaine applied to wound bed Edema Control - Lymphedema / Segmental Compressive Device / Other: Patient to wear own compression stockings. Remove compression stockings every night before going to bed and put on every morning when getting up. - refused compression wrap Elevate leg(s) parallel to the floor when sitting. DO YOUR BEST to sleep in the bed at night. DO NOT sleep in your recliner. Long hours of sitting in a recliner leads to swelling of the legs and/or potential wounds on your backside. Additional Orders / Instructions: Follow Nutritious Diet and Increase Protein Intake - continue to monitor blood sugar WOUND #4: - Lower Leg Wound Laterality: Right, Medial, Anterior Cleanser: Byram Ancillary Kit - 15 Day Supply (Generic) 3 x Per Week/15 Days Discharge Instructions: Use supplies as instructed; Kit contains: (15) Saline Bullets; (15) 3x3 Gauze; 15 pr Gloves Cleanser: Soap and Water 3 x Per Week/15 Days Discharge Instructions: Gently cleanse wound with antibacterial soap, rinse and pat dry prior to dressing wounds Cleanser: Wound Cleanser 3 x Per Week/15 Days Discharge Instructions: Wash your hands with soap and water. Remove old dressing, discard into plastic bag and place into trash. Cleanse the wound with Wound Cleanser prior to applying a clean dressing using gauze sponges, not tissues or cotton balls. Do not scrub or  use excessive force. Pat dry using gauze sponges, not tissue or cotton balls. Primary Dressing: Iodosorb 40 (g) (Generic) 3 x Per Week/15 Days Discharge Instructions: Apply IodoSorb to wound bed only as  directed. Secondary Dressing: Zetuvit Plus Silicone Border Dressing 4x4 (in/in) (Generic) 3 x Per Week/15 Days WOUND #6: - Lower Leg Wound Laterality: Right, Medial, Anterior Cleanser: Byram Ancillary Kit - 15 Day Supply (Generic) 3 x Per Week/15 Days Discharge Instructions: Use supplies as instructed; Kit contains: (15) Saline Bullets; (15) 3x3 Gauze; 15 pr Gloves Cleanser: Soap and Water 3 x Per Week/15 Days Discharge Instructions: Gently cleanse wound with antibacterial soap, rinse and pat dry prior to dressing wounds Cleanser: Wound Cleanser 3 x Per Week/15 Days Discharge Instructions: Wash your hands with soap and water. Remove old dressing, discard into plastic bag and place into trash. Cleanse the wound with Wound Cleanser prior to applying a clean dressing using gauze sponges, not tissues or cotton balls. Do not scrub or use excessive force. Pat dry using gauze sponges, not tissue or cotton balls. Primary Dressing: Iodosorb 40 (g) (Generic) 3 x Per Week/15 Days Discharge Instructions: Apply IodoSorb to wound bed only as directed. Secondary Dressing: Zetuvit Plus Silicone Border Dressing 4x4 (in/in) (Generic) 3 x Per Week/15 Days 1. Would recommend that we going continue with the wound care measures as before and the patient is in agreement with plan. This includes the use of the Iodosorb which I think is doing a good job. 2. Also can recommend that we continue with the Zetuvit border foam dressing to cover. 3. He will continue to use compression socks I did recommend a sock Donner to try to prevent him from scratching and scrubbing his legs in general. We will see patient back for reevaluation in 1 week here in the clinic. If anything worsens or changes patient will contact our office for additional recommendations. Electronic Signature(s) Signed: 05/14/2021 9:32:57 AM By: Worthy Keeler PA-C Entered By: Worthy Keeler on 05/14/2021 09:32:57 [place], Dorothea Glassman  ([id number]) -------------------------------------------------------------------------------- SuperBill Details Patient Name: [name]. Date of Service: 05/14/2021 Medical Record Number: [id number] Patient Account Number: [id number] Date of Birth/Sex: Mar 13, 1961 (61 y.o. M) Treating RN: Donnamarie Poag Primary Care Provider: [name] Other Clinician: Referring Provider: [name] Treating Provider/Extender: Skipper Cliche in Treatment: 4 Diagnosis Coding ICD-10 Codes Code Description [phone] Type 2 diabetes mellitus with other skin ulcer I89.0 Lymphedema, not elsewhere classified L97.822 Non-pressure chronic ulcer of other part of left lower leg with fat layer exposed L97.812 Non-pressure chronic ulcer of other part of right lower leg with fat layer exposed I87.312 Chronic venous hypertension (idiopathic) with ulcer of left lower extremity I87.311 Chronic venous hypertension (idiopathic) with ulcer of right lower extremity Z94.0 Kidney transplant status Facility Procedures CPT4 Code: 97530051 Description: 99213 - WOUND CARE VISIT-LEV 3 EST PT Modifier: Quantity: 1 Physician Procedures CPT4 Code: 1021117 Description: 99214 - WC PHYS LEVEL 4 - EST PT Modifier: Quantity: 1 CPT4 Code: Description: ICD-10 Diagnosis Description E11.622 Type 2 diabetes mellitus with other skin ulcer I89.0 Lymphedema, not elsewhere classified L97.822 Non-pressure chronic ulcer of other part of left lower leg with fat lay L97.812 Non-pressure chronic ulcer  of other part of right lower leg with fat la Modifier: er exposed yer exposed Quantity: Electronic Signature(s) Signed: 05/14/2021 9:33:29 AM By: Worthy Keeler PA-C Entered By: Worthy Keeler on 05/14/2021 09:33:28

## 2021-05-14 NOTE — Telephone Encounter (Signed)
Spoke with patient and reviewed results and recommendations. Scheduled repeat potassium level for next week and reviewed instructions for increasing potassium as ordered. He verbalized understanding with no further questions at this time.

## 2021-05-15 ENCOUNTER — Other Ambulatory Visit: Payer: Self-pay

## 2021-05-21 ENCOUNTER — Other Ambulatory Visit: Payer: Self-pay

## 2021-05-21 ENCOUNTER — Other Ambulatory Visit (INDEPENDENT_AMBULATORY_CARE_PROVIDER_SITE_OTHER): Payer: 59

## 2021-05-21 ENCOUNTER — Ambulatory Visit: Payer: 59

## 2021-05-21 ENCOUNTER — Encounter: Payer: 59 | Admitting: Physician Assistant

## 2021-05-21 ENCOUNTER — Other Ambulatory Visit (HOSPITAL_COMMUNITY): Payer: Self-pay

## 2021-05-21 DIAGNOSIS — I5042 Chronic combined systolic (congestive) and diastolic (congestive) heart failure: Secondary | ICD-10-CM | POA: Diagnosis not present

## 2021-05-21 DIAGNOSIS — L97812 Non-pressure chronic ulcer of other part of right lower leg with fat layer exposed: Secondary | ICD-10-CM | POA: Diagnosis not present

## 2021-05-21 DIAGNOSIS — I87311 Chronic venous hypertension (idiopathic) with ulcer of right lower extremity: Secondary | ICD-10-CM | POA: Diagnosis not present

## 2021-05-21 DIAGNOSIS — E1140 Type 2 diabetes mellitus with diabetic neuropathy, unspecified: Secondary | ICD-10-CM | POA: Diagnosis not present

## 2021-05-21 DIAGNOSIS — E876 Hypokalemia: Secondary | ICD-10-CM

## 2021-05-21 DIAGNOSIS — E11622 Type 2 diabetes mellitus with other skin ulcer: Secondary | ICD-10-CM | POA: Diagnosis not present

## 2021-05-21 DIAGNOSIS — E1122 Type 2 diabetes mellitus with diabetic chronic kidney disease: Secondary | ICD-10-CM | POA: Diagnosis not present

## 2021-05-21 DIAGNOSIS — I890 Lymphedema, not elsewhere classified: Secondary | ICD-10-CM | POA: Diagnosis not present

## 2021-05-21 DIAGNOSIS — I872 Venous insufficiency (chronic) (peripheral): Secondary | ICD-10-CM | POA: Diagnosis not present

## 2021-05-21 DIAGNOSIS — Z940 Kidney transplant status: Secondary | ICD-10-CM | POA: Diagnosis not present

## 2021-05-21 DIAGNOSIS — G4730 Sleep apnea, unspecified: Secondary | ICD-10-CM | POA: Diagnosis not present

## 2021-05-21 DIAGNOSIS — L97822 Non-pressure chronic ulcer of other part of left lower leg with fat layer exposed: Secondary | ICD-10-CM | POA: Diagnosis not present

## 2021-05-21 NOTE — Progress Notes (Addendum)
SHAAN, RHOADS ([id number]) Visit Report for 05/21/2021 Chief Complaint Document Details Patient Name: [name], [name]. Date of Service: 05/21/2021 8:30 AM Medical Record Number: [id number] Patient Account Number: [id number] Date of Birth/Sex: 11/04/1960 (61 y.o. M) Treating RN: Donnamarie Poag Primary Care Provider: [name] Other Clinician: Referring Provider: [name] Treating Provider/Extender: Skipper Cliche in Treatment: 5 Information Obtained from: Patient Chief Complaint Bilateral LE Ulcers Electronic Signature(s) Signed: 05/21/2021 8:46:35 AM By: Worthy Keeler PA-C Entered By: Worthy Keeler on 05/21/2021 08:46:35 Yapp, [name] ([id number]) -------------------------------------------------------------------------------- Debridement Details Patient Name: Carl Shams. Date of Service: 05/21/2021 8:30 AM Medical Record Number: [id number] Patient Account Number: [id number] Date of Birth/Sex: 1960-12-12 (61 y.o. M) Treating RN: Donnamarie Poag Primary Care Provider: [name] Other Clinician: Referring Provider: [name] Treating Provider/Extender: Skipper Cliche in Treatment: 5 Debridement Performed for Wound #4 Right,Medial,Anterior Lower Leg Assessment: Performed By: Physician Tommie Sams., PA-C Debridement Type: Debridement Severity of Tissue Pre Debridement: Fat layer exposed Level of Consciousness (Pre- Awake and Alert procedure): Pre-procedure Verification/Time Out Yes - 09:00 Taken: Start Time: 09:01 Pain Control: Lidocaine Total Area Debrided (L x W): 1 (cm) x 1 (cm) = 1 (cm) Tissue and other material Viable, Non-Viable, Slough, Subcutaneous, Slough debrided: Level: Skin/Subcutaneous Tissue Debridement Description: Excisional Instrument: Curette Bleeding: Minimum Hemostasis Achieved: Pressure Response to Treatment: Procedure was tolerated well Level of Consciousness (Post- Awake and Alert procedure): Post Debridement  Measurements of Total Wound Length: (cm) 1 Width: (cm) 0.9 Depth: (cm) 0.2 Volume: (cm) 0.141 Character of Wound/Ulcer Post Debridement: Improved Severity of Tissue Post Debridement: Fat layer exposed Post Procedure Diagnosis Same as Pre-procedure Electronic Signature(s) Signed: 05/21/2021 4:22:58 PM By: Donnamarie Poag Signed: 05/21/2021 5:31:39 PM By: Worthy Keeler PA-C Entered By: Donnamarie Poag on 05/21/2021 09:03:01 [name], [name] ([id number]) -------------------------------------------------------------------------------- HPI Details Patient Name: Carl Shams. Date of Service: 05/21/2021 8:30 AM Medical Record Number: [id number] Patient Account Number: [id number] Date of Birth/Sex: 03-27-1960 (61 y.o. M) Treating RN: Donnamarie Poag Primary Care Provider: [name] Other Clinician: Referring Provider: [name] Treating Provider/Extender: Skipper Cliche in Treatment: 5 History of Present Illness Location: left anterior shin and left medial calf Quality: Patient reports experiencing a dull pain to affected area(s). Severity: Patient states wound are getting worse. Duration: Patient has had the wound for > 3 months prior to seeking treatment at the wound center Timing: Pain in wound is Intermittent (comes and goes Context: The wound appeared gradually over time Modifying Factors: Other treatment(s) tried include:had surgery on the left anterior shin for a hematoma which has been nonhealing for the last 8 months Associated Signs and Symptoms: Patient reports having increase swelling. HPI Description: 61 year old diabetic was last hemoglobin A1c was 7.2% has been reviewed today with 2 ulcerated areas in his left lower extremity which she's had for about 8 months and the one most recently was started as a blister for 2 weeks. he has a past medical history of atrial fibrillation, coronary artery disease, CHF, chronic venous stasis dermatitis, diabetes mellitus,  hypertension, sleep apnea and is status post kidney transplant in 2012. He is a former smoker and quit cigars in 1996. In the remote past he's had some arterial and venous duplex studies done but does not recall the results no was ever treated for venous insufficiency. 06/10/2016 -- -- had a office visit at [facility] seen by the physicianos assistant [facility]. Patient underwent an ABI which showed medial calcification, normal tibial  artery Doppler waveform, PPG waveforms and toe brachial indices suggest normal arterial perfusion to bilateral lower extremities. right TBI was 0.97 and left was 0.84 Bilateral venous duplex was notable for venous reflux in the left greater saphenous vein and popliteal veins.. A left lower extremity endovenous laser ablation was recommended. 06/27/2016 -- I understand he spoke to his insurance company and there is a 3 month waiting period with conservative therapy prior to authorizing surgical intervention. 07/12/16- he is here for follow-up evaluation of a left lower extremity ulcer. He did bring his compression stockings today. He is voicing no complaints or concerns Readmission: 04/16/2021 upon evaluation today patient appears for reevaluation here in the clinic he was last seen in April 2018. At that time he was having issues with venous stasis ulcers and that again is what he is dealing with today as well. Fortunately I do not see any signs of active infection at this time which is great news and overall very pleased in that regard. Nonetheless he does have some areas of eschar which is going require debridement to clear away some of the necrotic debris so that we can hopefully get this healed as quickly as possible. That was discussed with the patient today as well. The patient does have diabetes mellitus type 2 for which she does have a hemoglobin A1c of 7.27 November 2020. He also has been on Keflex for what was felt to be cellulitis  that seems to be doing better these wounds began on March 08, 2021. The patient also has a history of lymphedema, chronic venous insufficiency, and is a kidney transplant patient. 04/24/2021 upon evaluation today patient appears to be doing okay in regard to his wounds. Fortunately there does not appear to be any evidence of active infection locally nor systemically at this time which is good news. Nonetheless he was not able to keep the compression wraps on I think this is going be a little bit more problem he tells me his anxiety was to the point he just was not able to do it. I do understand but at the same time that is good to make things somewhat difficult to be honest. 04/30/2021 upon evaluation today patient appears to be doing well with regard to his wounds on his legs. Fortunately I do not see any signs of active infection locally or systemically at this time which is great news. No fevers, chills, nausea, vomiting, or diarrhea. 05/07/2021 upon evaluation patient appears to be doing well with regard to his wounds. He does have a new area on the right leg due to a skin tear as he was pulling up his compression socks. This obviously is a little frustrating for him everything else seems to be doing much better his blood flow was good I did print off the test today shows that he has excellent TBI's which is also news. 05/14/2021 upon evaluation today patient appears to be doing better in fact in regard to his left leg he is completely healed in regard to the right leg he still continues to have some open areas in 2 spots but both are doing better in my opinion which is great news. Fortunately I do not see any signs of active infection locally or systemically at this time which is great news as well. 05/21/2021 upon evaluation today patient appears to be doing well with regard to his wounds. I feel like he is actually showing some signs of improvement which is great news. Fortunately I do not see  any  evidence of active infection locally nor systemically at this point which is great news. No fevers, chills, nausea, vomiting, or diarrhea. Electronic Signature(s) Signed: 05/21/2021 10:19:06 AM By: Worthy Keeler PA-C Entered By: Worthy Keeler on 05/21/2021 10:19:05 ZAXTON, ANGERER ([id number]) [address], [name] ([id number]) -------------------------------------------------------------------------------- Physical Exam Details Patient Name: [name], TIMME. Date of Service: 05/21/2021 8:30 AM Medical Record Number: [id number] Patient Account Number: [id number] Date of Birth/Sex: December 22, 1960 (61 y.o. M) Treating RN: Donnamarie Poag Primary Care Provider: [name] Other Clinician: Referring Provider: [name] Treating Provider/Extender: Skipper Cliche in Treatment: 5 Constitutional Well-nourished and well-hydrated in no acute distress. Respiratory normal breathing without difficulty. Psychiatric this patient is able to make decisions and demonstrates good insight into disease process. Alert and Oriented x 3. pleasant and cooperative. Notes Upon inspection patient's wound bed showed evidence of good granulation and epithelization at this point. Fortunately I do not see any evidence of active infection locally or systemically which is great news and overall I think that we are headed in the appropriate direction here. Electronic Signature(s) Signed: 05/21/2021 10:19:22 AM By: Worthy Keeler PA-C Entered By: Worthy Keeler on 05/21/2021 10:19:21 MARRIO, SCRIBNER ([id number]) -------------------------------------------------------------------------------- Physician Orders Details Patient Name: [name], BARRELL. Date of Service: 05/21/2021 8:30 AM Medical Record Number: [id number] Patient Account Number: [id number] Date of Birth/Sex: March 25, 1961 (61 y.o. M) Treating RN: Donnamarie Poag Primary Care Provider: [name] Other Clinician: Referring Provider: [name] Treating  Provider/Extender: Skipper Cliche in Treatment: 5 Verbal / Phone Orders: No Diagnosis Coding ICD-10 Coding Code Description E11.622 Type 2 diabetes mellitus with other skin ulcer I89.0 Lymphedema, not elsewhere classified L97.822 Non-pressure chronic ulcer of other part of left lower leg with fat layer exposed L97.812 Non-pressure chronic ulcer of other part of right lower leg with fat layer exposed I87.312 Chronic venous hypertension (idiopathic) with ulcer of left lower extremity I87.311 Chronic venous hypertension (idiopathic) with ulcer of right lower extremity Z94.0 Kidney transplant status Follow-up Appointments o Return Appointment in 1 week. o Nurse Visit as needed Bathing/ Shower/ Hygiene o May shower; gently cleanse wound with antibacterial soap, rinse and pat dry prior to dressing wounds o May shower with wound dressing protected with water repellent cover or cast protector. o No tub bath. Anesthetic (Use 'Patient Medications' Section for Anesthetic Order Entry) o Lidocaine applied to wound bed Edema Control - Lymphedema / Segmental Compressive Device / Other o Patient to wear own compression stockings. Remove compression stockings every night before going to bed and put on every morning when getting up. - refused compression wrap o Elevate leg(s) parallel to the floor when sitting. o DO YOUR BEST to sleep in the bed at night. DO NOT sleep in your recliner. Long hours of sitting in a recliner leads to swelling of the legs and/or potential wounds on your backside. Additional Orders / Instructions o Follow Nutritious Diet and Increase Protein Intake - continue to monitor blood sugar Wound Treatment Wound #4 - Lower Leg Wound Laterality: Right, Medial, Anterior Cleanser: Soap and Water 3 x Per Week/15 Days Discharge Instructions: Gently cleanse wound with antibacterial soap, rinse and pat dry prior to dressing wounds Cleanser: Wound Cleanser 3 x Per  Week/15 Days Discharge Instructions: Wash your hands with soap and water. Remove old dressing, discard into plastic bag and place into trash. Cleanse the wound with Wound Cleanser prior to applying a clean dressing using gauze sponges, not tissues or cotton balls. Do not scrub or  use excessive force. Pat dry using gauze sponges, not tissue or cotton balls. Primary Dressing: Prisma 4.34 (in) 3 x Per Week/15 Days Discharge Instructions: Moisten w/normal saline or sterile water; Cover wound as directed. Do not remove from wound bed. Secondary Dressing: Zetuvit Plus Silicone Border Dressing 4x4 (in/in) 3 x Per Week/15 Days Wound #6 - Lower Leg Wound Laterality: Right, Medial, Anterior Cleanser: Soap and Water 3 x Per Week/15 Days Discharge Instructions: Gently cleanse wound with antibacterial soap, rinse and pat dry prior to dressing wounds Cleanser: Wound Cleanser 3 x Per Week/15 Days [name], [name] ([id number]) Discharge Instructions: Wash your hands with soap and water. Remove old dressing, discard into plastic bag and place into trash. Cleanse the wound with Wound Cleanser prior to applying a clean dressing using gauze sponges, not tissues or cotton balls. Do not scrub or use excessive force. Pat dry using gauze sponges, not tissue or cotton balls. Primary Dressing: Prisma 4.34 (in) 3 x Per Week/15 Days Discharge Instructions: Moisten w/normal saline or sterile water; Cover wound as directed. Do not remove from wound bed. Secondary Dressing: Zetuvit Plus Silicone Border Dressing 4x4 (in/in) 3 x Per Week/15 Days Electronic Signature(s) Signed: 05/21/2021 4:22:58 PM By: Donnamarie Poag Signed: 05/21/2021 5:31:39 PM By: Worthy Keeler PA-C Entered By: Donnamarie Poag on 05/21/2021 09:04:37 [name], [name] ([id number]) -------------------------------------------------------------------------------- Problem List Details Patient Name: [name], STULTS. Date of Service: 05/21/2021 8:30 AM Medical Record  Number: [id number] Patient Account Number: [id number] Date of Birth/Sex: 08-17-1960 (61 y.o. M) Treating RN: Donnamarie Poag Primary Care Provider: [name] Other Clinician: Referring Provider: [name] Treating Provider/Extender: Skipper Cliche in Treatment: 5 Active Problems ICD-10 Encounter Code Description Active Date MDM Diagnosis E11.622 Type 2 diabetes mellitus with other skin ulcer 04/16/2021 No Yes I89.0 Lymphedema, not elsewhere classified 04/16/2021 No Yes L97.822 Non-pressure chronic ulcer of other part of left lower leg with fat layer 04/16/2021 No Yes exposed L97.812 Non-pressure chronic ulcer of other part of right lower leg with fat layer 04/16/2021 No Yes exposed I87.312 Chronic venous hypertension (idiopathic) with ulcer of left lower 04/16/2021 No Yes extremity I87.311 Chronic venous hypertension (idiopathic) with ulcer of right lower 04/16/2021 No Yes extremity Z94.0 Kidney transplant status 04/16/2021 No Yes Inactive Problems Resolved Problems Electronic Signature(s) Signed: 05/21/2021 8:46:32 AM By: Worthy Keeler PA-C Entered By: Worthy Keeler on 05/21/2021 08:46:32 [name], [name] ([id number]) -------------------------------------------------------------------------------- Progress Note Details Patient Name: Carl Speller F. Date of Service: 05/21/2021 8:30 AM Medical Record Number: [id number] Patient Account Number: [id number] Date of Birth/Sex: 1961-01-29 (61 y.o. M) Treating RN: Donnamarie Poag Primary Care Provider: [name] Other Clinician: Referring Provider: [name] Treating Provider/Extender: Skipper Cliche in Treatment: 5 Subjective Chief Complaint Information obtained from Patient Bilateral LE Ulcers History of Present Illness (HPI) The following HPI elements were documented for the patient's wound: Location: left anterior shin and left medial calf Quality: Patient reports experiencing a dull pain to affected  area(s). Severity: Patient states wound are getting worse. Duration: Patient has had the wound for > 3 months prior to seeking treatment at the wound center Timing: Pain in wound is Intermittent (comes and goes Context: The wound appeared gradually over time Modifying Factors: Other treatment(s) tried include:had surgery on the left anterior shin for a hematoma which has been nonhealing for the last 8 months Associated Signs and Symptoms: Patient reports having increase swelling. 61 year old diabetic was last hemoglobin A1c was 7.2% has been reviewed today with 2 ulcerated areas in  his left lower extremity which she's had for about 8 months and the one most recently was started as a blister for 2 weeks. he has a past medical history of atrial fibrillation, coronary artery disease, CHF, chronic venous stasis dermatitis, diabetes mellitus, hypertension, sleep apnea and is status post kidney transplant in 2012. He is a former smoker and quit cigars in 1996. In the remote past he's had some arterial and venous duplex studies done but does not recall the results no was ever treated for venous insufficiency. 06/10/2016 -- -- had a office visit at [facility] seen by the physician s assistant Marcelle Overlie. Patient underwent an ABI which showed medial calcification, normal tibial artery Doppler waveform, PPG waveforms and toe brachial indices suggest normal arterial perfusion to bilateral lower extremities. right TBI was 0.97 and left was 0.84 Bilateral venous duplex was notable for venous reflux in the left greater saphenous vein and popliteal veins.. A left lower extremity endovenous laser ablation was recommended. 06/27/2016 -- I understand he spoke to his insurance company and there is a 3 month waiting period with conservative therapy prior to authorizing surgical intervention. 07/12/16- he is here for follow-up evaluation of a left lower extremity ulcer. He did bring his  compression stockings today. He is voicing no complaints or concerns Readmission: 04/16/2021 upon evaluation today patient appears for reevaluation here in the clinic he was last seen in April 2018. At that time he was having issues with venous stasis ulcers and that again is what he is dealing with today as well. Fortunately I do not see any signs of active infection at this time which is great news and overall very pleased in that regard. Nonetheless he does have some areas of eschar which is going require debridement to clear away some of the necrotic debris so that we can hopefully get this healed as quickly as possible. That was discussed with the patient today as well. The patient does have diabetes mellitus type 2 for which she does have a hemoglobin A1c of 7.27 November 2020. He also has been on Keflex for what was felt to be cellulitis that seems to be doing better these wounds began on March 08, 2021. The patient also has a history of lymphedema, chronic venous insufficiency, and is a kidney transplant patient. 04/24/2021 upon evaluation today patient appears to be doing okay in regard to his wounds. Fortunately there does not appear to be any evidence of active infection locally nor systemically at this time which is good news. Nonetheless he was not able to keep the compression wraps on I think this is going be a little bit more problem he tells me his anxiety was to the point he just was not able to do it. I do understand but at the same time that is good to make things somewhat difficult to be honest. 04/30/2021 upon evaluation today patient appears to be doing well with regard to his wounds on his legs. Fortunately I do not see any signs of active infection locally or systemically at this time which is great news. No fevers, chills, nausea, vomiting, or diarrhea. 05/07/2021 upon evaluation patient appears to be doing well with regard to his wounds. He does have a new area on the right  leg due to a skin tear as he was pulling up his compression socks. This obviously is a little frustrating for him everything else seems to be doing much better his blood flow was good I did print off  the test today shows that he has excellent TBI's which is also news. 05/14/2021 upon evaluation today patient appears to be doing better in fact in regard to his left leg he is completely healed in regard to the right leg he still continues to have some open areas in 2 spots but both are doing better in my opinion which is great news. Fortunately I do not see any signs of active infection locally or systemically at this time which is great news as well. 05/21/2021 upon evaluation today patient appears to be doing well with regard to his wounds. I feel like he is actually showing some signs of improvement which is great news. Fortunately I do not see any evidence of active infection locally nor systemically at this point which is great news. No fevers, chills, nausea, vomiting, or diarrhea. [name], [name] ([id number]) Objective Constitutional Well-nourished and well-hydrated in no acute distress. Vitals Time Taken: 8:33 AM, Height: 70 in, Weight: 270 lbs, BMI: 38.7, Temperature: 97.7 F, Pulse: 48 bpm, Respiratory Rate: 16 breaths/min, Blood Pressure: 137/76 mmHg. Respiratory normal breathing without difficulty. Psychiatric this patient is able to make decisions and demonstrates good insight into disease process. Alert and Oriented x 3. pleasant and cooperative. General Notes: Upon inspection patient's wound bed showed evidence of good granulation and epithelization at this point. Fortunately I do not see any evidence of active infection locally or systemically which is great news and overall I think that we are headed in the appropriate direction here. Integumentary (Hair, Skin) Wound #4 status is Open. Original cause of wound was Blister. The date acquired was: 03/08/2021. The wound has been in  treatment 5 weeks. The wound is located on the Right,Medial,Anterior Lower Leg. The wound measures 1cm length x 0.9cm width x 0.1cm depth; 0.707cm^2 area and 0.071cm^3 volume. There is Fat Layer (Subcutaneous Tissue) exposed. There is no tunneling or undermining noted. There is a medium amount of serosanguineous drainage noted. There is small (1-33%) red, hyper - granulation within the wound bed. There is a large (67-100%) amount of necrotic tissue within the wound bed including Adherent Slough. Wound #6 status is Open. Original cause of wound was Gradually Appeared. The date acquired was: 05/04/2021. The wound has been in treatment 2 weeks. The wound is located on the Right,Medial,Anterior Lower Leg. The wound measures 1.9cm length x 1.2cm width x 0.1cm depth; 1.791cm^2 area and 0.179cm^3 volume. The wound is limited to skin breakdown. There is no tunneling or undermining noted. There is a medium amount of serosanguineous drainage noted. There is large (67-100%) red, hyper - granulation within the wound bed. There is a small (1-33%) amount of necrotic tissue within the wound bed including Adherent Slough. Assessment Active Problems ICD-10 Type 2 diabetes mellitus with other skin ulcer Lymphedema, not elsewhere classified Non-pressure chronic ulcer of other part of left lower leg with fat layer exposed Non-pressure chronic ulcer of other part of right lower leg with fat layer exposed Chronic venous hypertension (idiopathic) with ulcer of left lower extremity Chronic venous hypertension (idiopathic) with ulcer of right lower extremity Kidney transplant status Procedures Wound #4 Pre-procedure diagnosis of Wound #4 is a Venous Leg Ulcer located on the Right,Medial,Anterior Lower Leg .Severity of Tissue Pre Debridement is: Fat layer exposed. There was a Excisional Skin/Subcutaneous Tissue Debridement with a total area of 1 sq cm performed by Tommie Sams., PA-C. With the following instrument(s):  Curette to remove Viable and Non-Viable tissue/material. Material removed includes Subcutaneous Tissue and Slough and after  achieving pain control using Lidocaine. A time out was conducted at 09:00, prior to the start of the procedure. A Minimum amount of bleeding was controlled with Pressure. The procedure was tolerated well. Post Debridement Measurements: 1cm length x 0.9cm width x 0.2cm depth; 0.141cm^3 volume. Character of Wound/Ulcer Post Debridement is improved. Severity of Tissue Post Debridement is: Fat layer exposed. Post procedure Diagnosis Wound #4: Same as Pre-Procedure DOMONIK, LEVARIO ([id number]) Plan Follow-up Appointments: Return Appointment in 1 week. Nurse Visit as needed Bathing/ Shower/ Hygiene: May shower; gently cleanse wound with antibacterial soap, rinse and pat dry prior to dressing wounds May shower with wound dressing protected with water repellent cover or cast protector. No tub bath. Anesthetic (Use 'Patient Medications' Section for Anesthetic Order Entry): Lidocaine applied to wound bed Edema Control - Lymphedema / Segmental Compressive Device / Other: Patient to wear own compression stockings. Remove compression stockings every night before going to bed and put on every morning when getting up. - refused compression wrap Elevate leg(s) parallel to the floor when sitting. DO YOUR BEST to sleep in the bed at night. DO NOT sleep in your recliner. Long hours of sitting in a recliner leads to swelling of the legs and/or potential wounds on your backside. Additional Orders / Instructions: Follow Nutritious Diet and Increase Protein Intake - continue to monitor blood sugar WOUND #4: - Lower Leg Wound Laterality: Right, Medial, Anterior Cleanser: Soap and Water 3 x Per Week/15 Days Discharge Instructions: Gently cleanse wound with antibacterial soap, rinse and pat dry prior to dressing wounds Cleanser: Wound Cleanser 3 x Per Week/15 Days Discharge Instructions:  Wash your hands with soap and water. Remove old dressing, discard into plastic bag and place into trash. Cleanse the wound with Wound Cleanser prior to applying a clean dressing using gauze sponges, not tissues or cotton balls. Do not scrub or use excessive force. Pat dry using gauze sponges, not tissue or cotton balls. Primary Dressing: Prisma 4.34 (in) 3 x Per Week/15 Days Discharge Instructions: Moisten w/normal saline or sterile water; Cover wound as directed. Do not remove from wound bed. Secondary Dressing: Zetuvit Plus Silicone Border Dressing 4x4 (in/in) 3 x Per Week/15 Days WOUND #6: - Lower Leg Wound Laterality: Right, Medial, Anterior Cleanser: Soap and Water 3 x Per Week/15 Days Discharge Instructions: Gently cleanse wound with antibacterial soap, rinse and pat dry prior to dressing wounds Cleanser: Wound Cleanser 3 x Per Week/15 Days Discharge Instructions: Wash your hands with soap and water. Remove old dressing, discard into plastic bag and place into trash. Cleanse the wound with Wound Cleanser prior to applying a clean dressing using gauze sponges, not tissues or cotton balls. Do not scrub or use excessive force. Pat dry using gauze sponges, not tissue or cotton balls. Primary Dressing: Prisma 4.34 (in) 3 x Per Week/15 Days Discharge Instructions: Moisten w/normal saline or sterile water; Cover wound as directed. Do not remove from wound bed. Secondary Dressing: Zetuvit Plus Silicone Border Dressing 4x4 (in/in) 3 x Per Week/15 Days 1. Would recommend currently that we going to continue with the wound care measures as before and the patient is in agreement with plan. This includes the use of the silver collagen dressing which is good to be a change for today. 2. Is to continue with his compression socks which I think are doing a good job. We will be covering the dressing with a Zetuvit border foam dressing. We will see patient back for reevaluation in 1 week here in the  clinic.  If anything worsens or changes patient will contact our office for additional recommendations. Electronic Signature(s) Signed: 05/21/2021 10:19:51 AM By: Worthy Keeler PA-C Entered By: Worthy Keeler on 05/21/2021 10:19:51 Petrella, [name] ([id number]) -------------------------------------------------------------------------------- [place] Details Patient Name: Carl Shams. Date of Service: 05/21/2021 Medical Record Number: [id number] Patient Account Number: [id number] Date of Birth/Sex: 10-12-1960 (61 y.o. M) Treating RN: Donnamarie Poag Primary Care Provider: [name] Other Clinician: Referring Provider: [name] Treating Provider/Extender: Skipper Cliche in Treatment: 5 Diagnosis Coding ICD-10 Codes Code Description [phone] Type 2 diabetes mellitus with other skin ulcer I89.0 Lymphedema, not elsewhere classified L97.822 Non-pressure chronic ulcer of other part of left lower leg with fat layer exposed L97.812 Non-pressure chronic ulcer of other part of right lower leg with fat layer exposed I87.312 Chronic venous hypertension (idiopathic) with ulcer of left lower extremity I87.311 Chronic venous hypertension (idiopathic) with ulcer of right lower extremity Z94.0 Kidney transplant status Facility Procedures CPT4 Code: 11552080 Description: 22336 - DEB SUBQ TISSUE 20 SQ CM/< Modifier: Quantity: 1 CPT4 Code: Description: ICD-10 Diagnosis Description P22.449 Non-pressure chronic ulcer of other part of right lower leg with fat lay Modifier: er exposed Quantity: Physician Procedures CPT4 Code: 7530051 Description: 11042 - WC PHYS SUBQ TISS 20 SQ CM Modifier: Quantity: 1 CPT4 Code: Description: ICD-10 Diagnosis Description T02.111 Non-pressure chronic ulcer of other part of right lower leg with fat lay Modifier: er exposed Quantity: Electronic Signature(s) Signed: 05/21/2021 10:20:06 AM By: Worthy Keeler PA-C Entered By: Worthy Keeler on 05/21/2021  10:20:05

## 2021-05-21 NOTE — Progress Notes (Signed)
THEDFORD, BUNTON ([id number]) Visit Report for 05/21/2021 Arrival Information Details Patient Name: [name], [name]. Date of Service: 05/21/2021 8:30 AM Medical Record Number: [id number] Patient Account Number: [id number] Date of Birth/Sex: 1960-06-07 (61 y.o. M) Treating RN: Donnamarie Poag Primary Care Shaunte Weissinger: [name] Other Clinician: Referring Paislei Dorval: [name] Treating Denaisha Swango/Extender: Skipper Cliche in Treatment: 5 Visit Information History Since Last Visit Added or deleted any medications: No Patient Arrived: Ambulatory Had a fall or experienced change in No Arrival Time: 08:32 activities of daily living that may affect Accompanied By: self risk of falls: Transfer Assistance: None Hospitalized since last visit: No Patient Identification Verified: Yes Has Dressing in Place as Prescribed: Yes Secondary Verification Process Completed: Yes Pain Present Now: No Patient Requires Transmission-Based No Precautions: Patient Has Alerts: Yes Patient Alerts: Patient on Blood Thinner DIABETIC [place] left leg 04/16/21 ELIQUIS AVVS 05/02/21  ABI TBI L 0.90; R 1.03 Electronic Signature(s) Signed: 05/21/2021 4:22:58 PM By: Donnamarie Poag Entered By: Donnamarie Poag on 05/21/2021 08:32:59 Schriver, [name] ([id number]) -------------------------------------------------------------------------------- Encounter Discharge Information Details Patient Name: [name]. Date of Service: 05/21/2021 8:30 AM Medical Record Number: [id number] Patient Account Number: [id number] Date of Birth/Sex: 1960-11-06 (61 y.o. M) Treating RN: Donnamarie Poag Primary Care Conya Ellinwood: [name] Other Clinician: Referring Natasja Niday: [name] Treating Clayton Jarmon/Extender: Skipper Cliche in Treatment: 5 Encounter Discharge Information Items Post Procedure Vitals Discharge Condition: Stable Temperature (F): 97.7 Ambulatory Status: Ambulatory Pulse (bpm): 48 Discharge Destination:  Home Respiratory Rate (breaths/min): 16 Transportation: Private Auto Blood Pressure (mmHg): 137/76 Accompanied By: self Schedule Follow-up Appointment: Yes Clinical Summary of Care: Electronic Signature(s) Signed: 05/21/2021 4:22:58 PM By: Donnamarie Poag Entered By: Donnamarie Poag on 05/21/2021 09:06:49 Scalisi, [name] ([id number]) -------------------------------------------------------------------------------- Lower Extremity Assessment Details Patient Name: Carl Speller F. Date of Service: 05/21/2021 8:30 AM Medical Record Number: [id number] Patient Account Number: [id number] Date of Birth/Sex: 1960-05-16 (61 y.o. M) Treating RN: Donnamarie Poag Primary Care Johnedward Brodrick: [name] Other Clinician: Referring Jeannifer Drakeford: [name] Treating Cheynne Virden/Extender: Skipper Cliche in Treatment: 5 Edema Assessment Assessed: [Left: No] Patrice Paradise: Yes] [Left: Edema] [Right: :] Calf Left: Right: Point of Measurement: 30 cm From Medial Instep 39 cm Ankle Left: Right: Point of Measurement: 10 cm From Medial Instep 24 cm Knee To Floor Left: Right: From Medial Instep 40 cm Vascular Assessment Pulses: Dorsalis Pedis Palpable: [Right:Yes] Electronic Signature(s) Signed: 05/21/2021 4:22:58 PM By: Donnamarie Poag Entered By: Donnamarie Poag on 05/21/2021 08:42:52 [name], [name] ([id number]) -------------------------------------------------------------------------------- Multi Carl Chart Details Patient Name: Carl Speller F. Date of Service: 05/21/2021 8:30 AM Medical Record Number: [id number] Patient Account Number: [id number] Date of Birth/Sex: 27-Jun-1960 (61 y.o. M) Treating RN: Donnamarie Poag Primary Care Kaleth Koy: [name] Other Clinician: Referring Anginette Espejo: [name] Treating Dewayne Jurek/Extender: Skipper Cliche in Treatment: 5 Vital Signs Height(in): 70 Pulse(bpm): 48 Weight(lbs): 270 Blood Pressure(mmHg): 137/76 Body Mass Index(BMI): 38.7 Temperature(F): 97.7 Respiratory  Rate(breaths/min): 16 Photos: [N/A:N/A] Carl Location: Right, Medial, Anterior Lower Leg Right, Medial, Anterior Lower Leg N/A Wounding Event: Blister Gradually Appeared N/A Primary Etiology: Venous Leg Ulcer Venous Leg Ulcer N/A Comorbid History: Cataracts, Lymphedema, Asthma, Cataracts, Lymphedema, Asthma, N/A Sleep Apnea, Arrhythmia, Sleep Apnea, Arrhythmia, Congestive Heart Failure, Coronary Congestive Heart Failure, Coronary Artery Disease, Hypertension, Type Artery Disease, Hypertension, Type II Diabetes, End Stage Renal II Diabetes, End Stage Renal Disease, Gout, Osteoarthritis, Disease, Gout, Osteoarthritis, Neuropathy Neuropathy Date Acquired: 03/08/2021 05/04/2021 N/A Weeks of Treatment: 5 2 N/A Carl Status: Open Open N/A Carl Recurrence: No No N/A Measurements  L x W x D (cm) 1x0.9x0.1 1.9x1.2x0.1 N/A Area (cm) : 0.707 1.791 N/A Volume (cm) : 0.071 0.179 N/A % Reduction in Area: 68.40% 52.10% N/A % Reduction in Volume: 68.30% 52.10% N/A Classification: Full Thickness Without Exposed Partial Thickness N/A Support Structures Exudate Amount: Medium Medium N/A Exudate Type: Serosanguineous Serosanguineous N/A Exudate Color: red, brown red, brown N/A Granulation Amount: Small (1-33%) Large (67-100%) N/A Granulation Quality: Red, Hyper-granulation Red, Hyper-granulation N/A Necrotic Amount: Large (67-100%) Small (1-33%) N/A Exposed Structures: Fat Layer (Subcutaneous Tissue): Fascia: No N/A Yes Fat Layer (Subcutaneous Tissue): Fascia: No No Tendon: No Tendon: No Muscle: No Muscle: No Joint: No Joint: No Bone: No Bone: No Limited to Skin Breakdown Treatment Notes Electronic Signature(s) Signed: 05/21/2021 4:22:58 PM By: Hassan Rowan ([id number]) Entered By: Donnamarie Poag on 05/21/2021 08:43:26 Heyward, [name] ([id number]) -------------------------------------------------------------------------------- [place] Details Patient  Name: [name], [name]. Date of Service: 05/21/2021 8:30 AM Medical Record Number: [id number] Patient Account Number: [id number] Date of Birth/Sex: 12/15/1960 (61 y.o. M) Treating RN: Donnamarie Poag Primary Care Kody Brandl: [name] Other Clinician: Referring Ashlynn Gunnels: [name] Treating Rakisha Pincock/Extender: Skipper Cliche in Treatment: 5 Active Inactive Carl/Skin Impairment Nursing Diagnoses: Impaired tissue integrity Knowledge deficit related to smoking impact on Carl healing Knowledge deficit related to ulceration/compromised skin integrity Goals: Patient/caregiver will verbalize understanding of skin care regimen Date Initiated: 04/16/2021 Date Inactivated: 04/24/2021 Target Resolution Date: 04/27/2021 Goal Status: Met Ulcer/skin breakdown will have a volume reduction of 30% by week 4 Date Initiated: 04/16/2021 Date Inactivated: 04/30/2021 Target Resolution Date: 05/14/2021 Goal Status: Met Ulcer/skin breakdown will have a volume reduction of 50% by week 8 Date Initiated: 04/16/2021 Target Resolution Date: 06/11/2021 Goal Status: Active Ulcer/skin breakdown will have a volume reduction of 80% by week 12 Date Initiated: 04/16/2021 Target Resolution Date: 07/09/2021 Goal Status: Active Ulcer/skin breakdown will heal within 14 weeks Date Initiated: 04/16/2021 Target Resolution Date: 07/23/2021 Goal Status: Active Interventions: Assess patient/caregiver ability to obtain necessary supplies Assess patient/caregiver ability to perform ulcer/skin care regimen upon admission and as needed Assess ulceration(s) every visit Notes: Electronic Signature(s) Signed: 05/21/2021 4:22:58 PM By: Donnamarie Poag Entered By: Donnamarie Poag on 05/21/2021 08:43:16 [place], [name] ([id number]) -------------------------------------------------------------------------------- Pain Assessment Details Patient Name: [name]. Date of Service: 05/21/2021 8:30 AM Medical Record Number:  [id number] Patient Account Number: [id number] Date of Birth/Sex: 29-Aug-1960 (62 y.o. M) Treating RN: Donnamarie Poag Primary Care Kendyl Bissonnette: [name] Other Clinician: Referring Dawayne Ohair: [name] Treating Luciel Brickman/Extender: Skipper Cliche in Treatment: 5 Active Problems Location of Pain Severity and Description of Pain Patient Has Paino No Site Locations Rate the pain. Current Pain Level: 0 Pain Management and Medication Current Pain Management: Electronic Signature(s) Signed: 05/21/2021 4:22:58 PM By: Donnamarie Poag Entered By: Donnamarie Poag on 05/21/2021 08:36:53 [name], [name] ([id number]) -------------------------------------------------------------------------------- Patient/Caregiver Education Details Patient Name: [name]. Date of Service: 05/21/2021 8:30 AM Medical Record Number: [id number] Patient Account Number: [id number] Date of Birth/Gender: 01/10/1961 (61 y.o. M) Treating RN: Donnamarie Poag Primary Care Physician: [name] Other Clinician: Referring Physician: [name] Treating Physician/Extender: Skipper Cliche in Treatment: 5 Education Assessment Education Provided To: Patient Education Topics Provided Basic Hygiene: Elevated Blood Sugar/ Impact on Healing: Venous: Carl Debridement: Carl/Skin Impairment: Electronic Signature(s) Signed: 05/21/2021 4:22:58 PM By: Donnamarie Poag Entered By: Donnamarie Poag on 05/21/2021 09:05:11 [name], [name] ([id number]) -------------------------------------------------------------------------------- Carl Assessment Details Patient Name: Carl Speller F. Date of Service: 05/21/2021 8:30 AM Medical Record Number: [id number] Patient Account  Number: [id number] Date of Birth/Sex: 03-18-1961 (61 y.o. M) Treating RN: Donnamarie Poag Primary Care Tatum Massman: [name] Other Clinician: Referring [name]: [name] Treating Joss Mcdill/Extender: Skipper Cliche in Treatment: 5 Carl Status Carl  Number: 4 Primary Venous Leg Ulcer Etiology: Carl Location: Right, Medial, Anterior Lower Leg Carl Open Wounding Event: Blister Status: Date Acquired: 03/08/2021 Comorbid Cataracts, Lymphedema, Asthma, Sleep Apnea, Weeks Of Treatment: 5 History: Arrhythmia, Congestive Heart Failure, Coronary Artery Clustered Carl: No Disease, Hypertension, Type II Diabetes, End Stage Renal Disease, Gout, Osteoarthritis, Neuropathy Photos Carl Measurements Length: (cm) 1 Width: (cm) 0.9 Depth: (cm) 0.1 Area: (cm) 0.707 Volume: (cm) 0.071 % Reduction in Area: 68.4% % Reduction in Volume: 68.3% Tunneling: No Undermining: No Carl Description Classification: Full Thickness Without Exposed Support Structures Exudate Amount: Medium Exudate Type: Serosanguineous Exudate Color: red, brown Foul Odor After Cleansing: No Slough/Fibrino Yes Carl Bed Granulation Amount: Small (1-33%) Exposed Structure Granulation Quality: Red, Hyper-granulation Fascia Exposed: No Necrotic Amount: Large (67-100%) Fat Layer (Subcutaneous Tissue) Exposed: Yes Necrotic Quality: Adherent Slough Tendon Exposed: No Muscle Exposed: No Joint Exposed: No Bone Exposed: No Treatment Notes Carl #4 (Lower Leg) Carl Laterality: Right, Medial, Anterior Cleanser Soap and Water Discharge Instruction: Gently cleanse Carl with antibacterial soap, rinse and pat dry prior to dressing wounds Carl Cleanser KAIRYN, OLMEDA. ([id number]) Discharge Instruction: Wash your hands with soap and water. Remove old dressing, discard into plastic bag and place into trash. Cleanse the Carl with Carl Cleanser prior to applying a clean dressing using gauze sponges, not tissues or cotton balls. Do not scrub or use excessive force. Pat dry using gauze sponges, not tissue or cotton balls. Peri-Carl Care Topical Primary Dressing Prisma 4.34 (in) Discharge Instruction: Moisten w/normal saline or sterile water; Cover Carl as directed.  Do not remove from Carl bed. Secondary Dressing Zetuvit Plus Silicone Border Dressing 4x4 (in/in) Secured With Compression Wrap Compression Stockings Add-Ons Electronic Signature(s) Signed: 05/21/2021 4:22:58 PM By: Donnamarie Poag Entered By: Donnamarie Poag on 05/21/2021 [name], [name] ([id number]) -------------------------------------------------------------------------------- Carl Assessment Details Patient Name: Carl Speller F. Date of Service: 05/21/2021 8:30 AM Medical Record Number: [id number] Patient Account Number: [id number] Date of Birth/Sex: 1960-08-14 (61 y.o. M) Treating RN: Donnamarie Poag Primary Care Micha Dosanjh: [name] Other Clinician: Referring Brelynn Wheller: [name] Treating Glynn Freas/Extender: Skipper Cliche in Treatment: 5 Carl Status Carl Number: 6 Primary Venous Leg Ulcer Etiology: Carl Location: Right, Medial, Anterior Lower Leg Carl Open Wounding Event: Gradually Appeared Status: Date Acquired: 05/04/2021 Comorbid Cataracts, Lymphedema, Asthma, Sleep Apnea, Weeks Of Treatment: 2 History: Arrhythmia, Congestive Heart Failure, Coronary Artery Clustered Carl: No Disease, Hypertension, Type II Diabetes, End Stage Renal Disease, Gout, Osteoarthritis, Neuropathy Photos Carl Measurements Length: (cm) 1.9 Width: (cm) 1.2 Depth: (cm) 0.1 Area: (cm) 1.791 Volume: (cm) 0.179 % Reduction in Area: 52.1% % Reduction in Volume: 52.1% Tunneling: No Undermining: No Carl Description Classification: Partial Thickness Exudate Amount: Medium Exudate Type: Serosanguineous Exudate Color: red, brown Foul Odor After Cleansing: No Slough/Fibrino Yes Carl Bed Granulation Amount: Large (67-100%) Exposed Structure Granulation Quality: Red, Hyper-granulation Fascia Exposed: No Necrotic Amount: Small (1-33%) Fat Layer (Subcutaneous Tissue) Exposed: No Necrotic Quality: Adherent Slough Tendon Exposed: No Muscle Exposed: No Joint Exposed:  No Bone Exposed: No Limited to Skin Breakdown Treatment Notes Carl #6 (Lower Leg) Carl Laterality: Right, Medial, Anterior Cleanser Soap and Water Discharge Instruction: Gently cleanse Carl with antibacterial soap, rinse and pat dry prior to dressing wounds [name], [name] ([id number]) Carl Cleanser Discharge Instruction: Wash your  hands with soap and water. Remove old dressing, discard into plastic bag and place into trash. Cleanse the Carl with Carl Cleanser prior to applying a clean dressing using gauze sponges, not tissues or cotton balls. Do not scrub or use excessive force. Pat dry using gauze sponges, not tissue or cotton balls. Peri-Carl Care Topical Primary Dressing Prisma 4.34 (in) Discharge Instruction: Moisten w/normal saline or sterile water; Cover Carl as directed. Do not remove from Carl bed. Secondary Dressing Zetuvit Plus Silicone Border Dressing 4x4 (in/in) Secured With Compression Wrap Compression Stockings Add-Ons Electronic Signature(s) Signed: 05/21/2021 4:22:58 PM By: Donnamarie Poag Entered By: Donnamarie Poag on 05/21/2021 08:42:00 [name], [name] ([id number]) -------------------------------------------------------------------------------- [place] Details Patient Name: [name]. Date of Service: 05/21/2021 8:30 AM Medical Record Number: [id number] Patient Account Number: [id number] Date of Birth/Sex: 11-23-1960 (61 y.o. M) Treating RN: Donnamarie Poag Primary Care Montoya Watkin: [name] Other Clinician: Referring Jalasia Eskridge: [name] Treating Maddalena Linarez/Extender: Skipper Cliche in Treatment: 5 Vital Signs Time Taken: 08:33 Temperature (F): 97.7 Height (in): 70 Pulse (bpm): 48 Weight (lbs): 270 Respiratory Rate (breaths/min): 16 Body Mass Index (BMI): 38.7 Blood Pressure (mmHg): 137/76 Reference Range: 80 - 120 mg / dl Electronic Signature(s) Signed: 05/21/2021 4:22:58 PM By: Donnamarie Poag Entered ByDonnamarie Poag on 05/21/2021  08:36:33

## 2021-05-21 NOTE — Progress Notes (Signed)
[name] states he has been doing well.  We discussed the low potassium.  He states the pills makes him sick to his stomach.  Advised to eat with them.  He states he has been taking them before he eats.  Advised him to eat first then take them to see if that helps.  He had blood work done today, not back yet.  He states legs wounds are healing but taking a long time to heal.  He states learning a lot of taking care of a wound.  He is aware of up coming appts.  He has all his medications and aware of how to take them.  He tries to watch high sodium foods, he mostly eats at home foods he cooks.  Will try to make a home visit between his appts, he has several coming up.  Mood appears good.  Will visit for heart failure, diet and medication management.   [place] [phone]

## 2021-05-22 ENCOUNTER — Encounter: Payer: 59 | Admitting: Psychology

## 2021-05-22 ENCOUNTER — Other Ambulatory Visit: Payer: Self-pay

## 2021-05-22 LAB — POTASSIUM: Potassium: 4.2 mmol/L (ref 3.5–5.2)

## 2021-05-22 MED ORDER — INSULIN LISPRO (1 UNIT DIAL) 100 UNIT/ML (KWIKPEN)
PEN_INJECTOR | SUBCUTANEOUS | 0 refills | Status: DC
  Filled 2021-05-22: qty 45, 90d supply, fill #0

## 2021-05-22 MED ORDER — GABAPENTIN 300 MG PO CAPS
300.0000 mg | ORAL_CAPSULE | Freq: Two times a day (BID) | ORAL | 0 refills | Status: DC
  Filled 2021-05-22 – 2021-08-07 (×2): qty 180, 90d supply, fill #0

## 2021-05-22 MED ORDER — INSULIN LISPRO (1 UNIT DIAL) 100 UNIT/ML (KWIKPEN)
PEN_INJECTOR | SUBCUTANEOUS | 0 refills | Status: DC
  Filled 2021-05-22: qty 15, 30d supply, fill #0

## 2021-05-22 NOTE — Telephone Encounter (Signed)
Patient calling for new lab test results

## 2021-05-22 NOTE — Telephone Encounter (Signed)
Called patient and informed him of the lab result note as stated below:  Potassium normal.  Continue current dose of potassium supplement.   Patient verbalized understanding and agreed with plan.

## 2021-05-23 ENCOUNTER — Ambulatory Visit (INDEPENDENT_AMBULATORY_CARE_PROVIDER_SITE_OTHER): Payer: 59 | Admitting: Psychology

## 2021-05-23 ENCOUNTER — Other Ambulatory Visit: Payer: Self-pay

## 2021-05-23 DIAGNOSIS — I6781 Acute cerebrovascular insufficiency: Secondary | ICD-10-CM | POA: Diagnosis not present

## 2021-05-23 DIAGNOSIS — F331 Major depressive disorder, recurrent, moderate: Secondary | ICD-10-CM

## 2021-05-23 DIAGNOSIS — R41840 Attention and concentration deficit: Secondary | ICD-10-CM

## 2021-05-23 NOTE — Progress Notes (Signed)
? ?  Neuropsychology Feedback Session ?[place]. [facility] ?[place] Department of Neurology ? ?Reason for Referral:  ? ?[name]. is a 61 y.o. mixed-handed Hispanic male referred by  [name], M.D. , to characterize his current cognitive functioning and assist with diagnostic clarity and treatment planning in the context of subjective cognitive decline.  ? ?Feedback:  ? ?[name] completed a comprehensive neuropsychological evaluation on 04/26/2021. Please refer to that encounter for the full report and recommendations. Briefly, results suggested neuropsychological functioning largely within normal limits relative to age-matched peers. He did exhibit some difficulty with basic attention/concentration, as well as when asked to differentiate stimuli from non-stimuli across memory tasks. However, I do not believe that these areas of difficulty arise to where a formal cognitive disorder is warranted. The combination of chronic sleep dysfunction and psychiatric distress can certainly create and worsen cognitive dysfunction, especially with regard to attentional dysregulation. It could also serve to worsen changes due to a vascular culprit.  ? ?[name] was accompanied by his wife during the current feedback session. Content of the current session focused on the results of his neuropsychological evaluation. [name] was given the opportunity to ask questions and his questions were answered. He was encouraged to reach out should additional questions arise. A copy of his report was provided at the conclusion of the visit.  ? ?  ? ?18 minutes were spent conducting the current feedback session with [name], billed as one unit [phone].  ?

## 2021-05-25 ENCOUNTER — Other Ambulatory Visit: Payer: Self-pay

## 2021-05-25 DIAGNOSIS — G4733 Obstructive sleep apnea (adult) (pediatric): Secondary | ICD-10-CM | POA: Diagnosis not present

## 2021-05-25 DIAGNOSIS — E662 Morbid (severe) obesity with alveolar hypoventilation: Secondary | ICD-10-CM | POA: Diagnosis not present

## 2021-05-28 ENCOUNTER — Other Ambulatory Visit: Payer: Self-pay | Admitting: Internal Medicine

## 2021-05-28 ENCOUNTER — Encounter: Payer: 59 | Attending: Physician Assistant | Admitting: Physician Assistant

## 2021-05-28 ENCOUNTER — Other Ambulatory Visit: Payer: Self-pay

## 2021-05-28 DIAGNOSIS — L97822 Non-pressure chronic ulcer of other part of left lower leg with fat layer exposed: Secondary | ICD-10-CM | POA: Diagnosis not present

## 2021-05-28 DIAGNOSIS — L97812 Non-pressure chronic ulcer of other part of right lower leg with fat layer exposed: Secondary | ICD-10-CM | POA: Insufficient documentation

## 2021-05-28 DIAGNOSIS — I87311 Chronic venous hypertension (idiopathic) with ulcer of right lower extremity: Secondary | ICD-10-CM | POA: Insufficient documentation

## 2021-05-28 DIAGNOSIS — E669 Obesity, unspecified: Secondary | ICD-10-CM | POA: Diagnosis not present

## 2021-05-28 DIAGNOSIS — I132 Hypertensive heart and chronic kidney disease with heart failure and with stage 5 chronic kidney disease, or end stage renal disease: Secondary | ICD-10-CM | POA: Diagnosis not present

## 2021-05-28 DIAGNOSIS — I890 Lymphedema, not elsewhere classified: Secondary | ICD-10-CM | POA: Diagnosis not present

## 2021-05-28 DIAGNOSIS — E1142 Type 2 diabetes mellitus with diabetic polyneuropathy: Secondary | ICD-10-CM | POA: Diagnosis not present

## 2021-05-28 DIAGNOSIS — M1990 Unspecified osteoarthritis, unspecified site: Secondary | ICD-10-CM | POA: Insufficient documentation

## 2021-05-28 DIAGNOSIS — E1140 Type 2 diabetes mellitus with diabetic neuropathy, unspecified: Secondary | ICD-10-CM | POA: Diagnosis not present

## 2021-05-28 DIAGNOSIS — N1831 Chronic kidney disease, stage 3a: Secondary | ICD-10-CM | POA: Diagnosis not present

## 2021-05-28 DIAGNOSIS — I2510 Atherosclerotic heart disease of native coronary artery without angina pectoris: Secondary | ICD-10-CM | POA: Diagnosis not present

## 2021-05-28 DIAGNOSIS — Z940 Kidney transplant status: Secondary | ICD-10-CM | POA: Diagnosis not present

## 2021-05-28 DIAGNOSIS — I10 Essential (primary) hypertension: Secondary | ICD-10-CM | POA: Diagnosis not present

## 2021-05-28 DIAGNOSIS — G4730 Sleep apnea, unspecified: Secondary | ICD-10-CM | POA: Insufficient documentation

## 2021-05-28 DIAGNOSIS — E1122 Type 2 diabetes mellitus with diabetic chronic kidney disease: Secondary | ICD-10-CM | POA: Insufficient documentation

## 2021-05-28 DIAGNOSIS — N186 End stage renal disease: Secondary | ICD-10-CM | POA: Insufficient documentation

## 2021-05-28 DIAGNOSIS — E1169 Type 2 diabetes mellitus with other specified complication: Secondary | ICD-10-CM | POA: Diagnosis not present

## 2021-05-28 DIAGNOSIS — E1165 Type 2 diabetes mellitus with hyperglycemia: Secondary | ICD-10-CM | POA: Diagnosis not present

## 2021-05-28 DIAGNOSIS — M109 Gout, unspecified: Secondary | ICD-10-CM | POA: Insufficient documentation

## 2021-05-28 DIAGNOSIS — I872 Venous insufficiency (chronic) (peripheral): Secondary | ICD-10-CM | POA: Diagnosis not present

## 2021-05-28 DIAGNOSIS — I509 Heart failure, unspecified: Secondary | ICD-10-CM | POA: Insufficient documentation

## 2021-05-28 DIAGNOSIS — E11622 Type 2 diabetes mellitus with other skin ulcer: Secondary | ICD-10-CM | POA: Insufficient documentation

## 2021-05-28 DIAGNOSIS — Z794 Long term (current) use of insulin: Secondary | ICD-10-CM | POA: Diagnosis not present

## 2021-05-28 MED ORDER — TAMSULOSIN HCL 0.4 MG PO CAPS
0.4000 mg | ORAL_CAPSULE | Freq: Every day | ORAL | 3 refills | Status: DC
  Filled 2021-05-28: qty 90, 90d supply, fill #0
  Filled 2021-08-21: qty 90, 90d supply, fill #1
  Filled 2021-11-19: qty 90, 90d supply, fill #2
  Filled 2022-02-20: qty 90, 90d supply, fill #3

## 2021-05-28 MED ORDER — INSULIN LISPRO (1 UNIT DIAL) 100 UNIT/ML (KWIKPEN)
50.0000 [IU] | PEN_INJECTOR | Freq: Every day | SUBCUTANEOUS | 3 refills | Status: DC
  Filled 2021-05-28: qty 45, 90d supply, fill #0

## 2021-05-28 MED ORDER — FREESTYLE LIBRE 3 SENSOR MISC
3 refills | Status: DC
  Filled 2021-05-28 – 2022-02-20 (×2): qty 6, 84d supply, fill #0
  Filled 2022-04-05: qty 6, 84d supply, fill #1
  Filled 2022-04-29: qty 2, 28d supply, fill #1
  Filled 2022-04-30: qty 6, 84d supply, fill #1

## 2021-05-28 MED ORDER — UNIFINE PENTIPS 31G X 5 MM MISC
3 refills | Status: DC
  Filled 2021-05-28: qty 400, 90d supply, fill #0
  Filled 2022-02-20: qty 400, 90d supply, fill #1

## 2021-05-28 MED ORDER — INSULIN GLARGINE-YFGN 100 UNIT/ML ~~LOC~~ SOPN
PEN_INJECTOR | SUBCUTANEOUS | 3 refills | Status: DC
Start: 2021-05-28 — End: 2022-01-22
  Filled 2021-05-28: qty 45, 90d supply, fill #0
  Filled 2021-06-29: qty 30, 75d supply, fill #0
  Filled 2021-10-09: qty 45, 90d supply, fill #0
  Filled 2022-01-02: qty 30, 75d supply, fill #1

## 2021-05-28 MED FILL — Dapagliflozin Propanediol Tab 10 MG (Base Equivalent): ORAL | 30 days supply | Qty: 30 | Fill #6 | Status: AC

## 2021-05-28 NOTE — Telephone Encounter (Signed)
Refill Wellbutrin ?Last refill 05/24/20 #180/2 ?Last office visit acute 01/01/21 ?No upcoming appointment scheduled ?

## 2021-05-28 NOTE — Progress Notes (Signed)
[name], [name] ([id number]) Visit Report for 05/28/2021 Arrival Information Details Patient Name: [name], [name]. Date of Service: 05/28/2021 8:30 AM Medical Record Number: [id number] Patient Account Number: [id number] Date of Birth/Sex: 1960-11-03 (61 y.o. M) Treating RN: Donnamarie Poag Primary Care Venice Marcucci: [name] Other Clinician: Referring Maurice Ramseur: [name] Treating Midas Daughety/Extender: Skipper Cliche in Treatment: 6 Visit Information History Since Last Visit Added or deleted any medications: No Patient Arrived: Ambulatory Had a fall or experienced change in No Arrival Time: 08:30 activities of daily living that may affect Accompanied By: self risk of falls: Transfer Assistance: None Hospitalized since last visit: No Patient Identification Verified: Yes Has Dressing in Place as Prescribed: Yes Secondary Verification Process Completed: Yes Pain Present Now: No Patient Requires Transmission-Based No Precautions: Patient Has Alerts: Yes Patient Alerts: Patient on Blood Thinner DIABETIC [place] left leg 04/16/21 ELIQUIS AVVS 05/02/21  ABI TBI L 0.90; R 1.03 Electronic Signature(s) Signed: 05/28/2021 2:46:35 PM By: Donnamarie Poag Entered By: Donnamarie Poag on 05/28/2021 08:30:37 [place], [name] ([id number]) -------------------------------------------------------------------------------- Clinic Level of Care Assessment Details Patient Name: [name]. Date of Service: 05/28/2021 8:30 AM Medical Record Number: [id number] Patient Account Number: [id number] Date of Birth/Sex: [birth year] (61 y.o. M) Treating RN: Donnamarie Poag Primary Care Deyanna Mctier: [name] Other Clinician: Referring Amyjo Mizrachi: [name] Treating Kalei Meda/Extender: Skipper Cliche in Treatment: 6 Clinic Level of Care Assessment Items TOOL 4 Quantity Score _0  - Use when only an EandM is performed on FOLLOW-UP visit 0 ASSESSMENTS - Nursing Assessment / Reassessment _1  - Reassessment of  Co-morbidities (includes updates in patient status) 0 _2  - 0 Reassessment of Adherence to Treatment Plan ASSESSMENTS - Wound and Skin Assessment / Reassessment _3  - Simple Wound Assessment / Reassessment - one wound 0 X- 2 5 Complex Wound Assessment / Reassessment - multiple wounds _4  - 0 Dermatologic / Skin Assessment (not related to wound area) ASSESSMENTS - Focused Assessment _5  - Circumferential Edema Measurements - multi extremities 0 _6  - 0 Nutritional Assessment / Counseling / Intervention _7  - 0 Lower Extremity Assessment (monofilament, tuning fork, pulses) _8  - 0 Peripheral Arterial Disease Assessment (using hand held doppler) ASSESSMENTS - Ostomy and/or Continence Assessment and Care _9  - Incontinence Assessment and Management 0 _10  - 0 Ostomy Care Assessment and Management (repouching, etc.) PROCESS - Coordination of Care X - Simple Patient / Family Education for ongoing care 1 15 _11  - 0 Complex (extensive) Patient / Family Education for ongoing care _12  - 0 Staff obtains Programmer, systems, Records, Test Results / Process Orders _13  - 0 Staff telephones HHA, Nursing Homes / Clarify orders / etc _14  - 0 Routine Transfer to another Facility (non-emergent condition) _15  - 0 Routine Hospital Admission (non-emergent condition) _16  - 0 New Admissions / Biomedical engineer / Ordering NPWT, Apligraf, etc. _17  - 0 Emergency Hospital Admission (emergent condition) X- 1 10 Simple Discharge Coordination _18  - 0 Complex (extensive) Discharge Coordination PROCESS - Special Needs _19  - Pediatric / Minor Patient Management 0 _20  - 0 Isolation Patient Management _21  - 0 Hearing / Language / Visual special needs _22  - 0 Assessment of Community assistance (transportation, D/C planning, etc.) _23  - 0 Additional assistance / Altered mentation _24  - 0 Support Surface(s) Assessment (bed, cushion, seat, etc.) INTERVENTIONS - Wound Cleansing / Measurement Geddes, Rishi F. ([id number]) X- 1  5 Simple Wound Cleansing - one wound _25  - 0 Complex Wound Cleansing - multiple wounds X- 1 5 Wound Imaging (photographs - any number of wounds) _26  - 0 Wound  Tracing (instead of photographs) X- 1 5 Simple Wound Measurement - one wound _0  - 0 Complex Wound Measurement - multiple wounds INTERVENTIONS - Wound Dressings X - Small Wound Dressing one or multiple wounds 2 10 _1  - 0 Medium Wound Dressing one or multiple wounds _2  - 0 Large Wound Dressing one or multiple wounds X- 1 5 Application of Medications - topical <LNLGXQJJHERDEYCX>_4<\/GYJEHUDJSHFWYOVZ>_8  - 0 Application of Medications - injection INTERVENTIONS - Miscellaneous _4  - External ear exam 0 _5  - 0 Specimen Collection (cultures, biopsies, blood, body fluids, etc.) _6  - 0 Specimen(s) / Culture(s) sent or taken to Lab for analysis _7  - 0 Patient Transfer (multiple staff / Civil Service fast streamer / Similar devices) _8  - 0 Simple Staple / Suture removal (25 or less) _9  - 0 Complex Staple / Suture removal (26 or more) _10  - 0 Hypo / Hyperglycemic Management (close monitor of Blood Glucose) _11  - 0 Ankle / Brachial Index (ABI) - do not check if billed separately X- 1 5 Vital Signs Has the patient been seen at the hospital within the last three years: Yes Total Score: 80 Level Of Care: New/Established - Level 3 Electronic Signature(s) Signed: 05/28/2021 2:46:35 PM By: Donnamarie Poag Entered By: Donnamarie Poag on 05/28/2021 08:51:50 [name], [name] ([id number]) -------------------------------------------------------------------------------- Encounter Discharge Information Details Patient Name: [name]. Date of Service: 05/28/2021 8:30 AM Medical Record Number: [id number] Patient Account Number: [id number] Date of Birth/Sex: Sep 30, 1960 (61 y.o. M) Treating RN: Donnamarie Poag Primary Care Rahima Fleishman: [name] Other Clinician: Referring Johnnae Impastato: [name] Treating Analiz Tvedt/Extender: Skipper Cliche in Treatment: 6 Encounter Discharge Information  Items Discharge Condition: Stable Ambulatory Status: Ambulatory Discharge Destination: Home Transportation: Private Auto Accompanied By: self Schedule Follow-up Appointment: Yes Clinical Summary of Care: Electronic Signature(s) Signed: 05/28/2021 2:46:35 PM By: Donnamarie Poag Entered By: Donnamarie Poag on 05/28/2021 08:58:15 Barrasso, [name] ([id number]) -------------------------------------------------------------------------------- Lower Extremity Assessment Details Patient Name: Carl Speller F. Date of Service: 05/28/2021 8:30 AM Medical Record Number: [id number] Patient Account Number: [id number] Date of Birth/Sex: 1961/03/17 (61 y.o. M) Treating RN: Donnamarie Poag Primary Care Orvin Netter: [name] Other Clinician: Referring Galen Russman: [name] Treating Gwendelyn Lanting/Extender: Skipper Cliche in Treatment: 6 Edema Assessment Assessed: [Left: No] Patrice Paradise: Yes] [Left: Edema] [Right: :] Calf Left: Right: Point of Measurement: 30 cm From Medial Instep 39 cm Ankle Left: Right: Point of Measurement: 10 cm From Medial Instep 24 cm Knee To Floor Left: Right: From Medial Instep 40 cm Vascular Assessment Pulses: Dorsalis Pedis Palpable: [Right:Yes] Electronic Signature(s) Signed: 05/28/2021 2:46:35 PM By: Donnamarie Poag Entered By: Donnamarie Poag on 05/28/2021 08:41:00 [name], [name] ([id number]) -------------------------------------------------------------------------------- Multi Wound Chart Details Patient Name: Carl Speller F. Date of Service: 05/28/2021 8:30 AM Medical Record Number: [id number] Patient Account Number: [id number] Date of Birth/Sex: 02-29-1961 (61 y.o. M) Treating RN: Donnamarie Poag Primary Care Fynlee Rowlands: [name] Other Clinician: Referring Kalyan Barabas: [name] Treating Macee Venables/Extender: Skipper Cliche in Treatment: 6 Vital Signs Height(in): 70 Pulse(bpm): 59 Weight(lbs): 270 Blood Pressure(mmHg): 151/82 Body Mass Index(BMI): 38.7 Temperature(F):  97.7 Respiratory Rate(breaths/min): 16 Photos: [N/A:N/A] Wound Location: Right, Medial, Anterior Lower Leg Right, Medial, Anterior Lower Leg N/A Wounding Event: Blister Gradually Appeared N/A Primary Etiology: Venous Leg Ulcer Venous Leg Ulcer N/A Comorbid History: Cataracts, Lymphedema, Asthma, Cataracts, Lymphedema, Asthma, N/A Sleep Apnea, Arrhythmia, Sleep Apnea, Arrhythmia, Congestive Heart Failure, Coronary Congestive Heart Failure, Coronary Artery Disease, Hypertension, Type Artery Disease, Hypertension, Type II Diabetes, End Stage Renal II Diabetes, End Stage Renal Disease, Gout, Osteoarthritis, Disease, Gout, Osteoarthritis, Neuropathy  Neuropathy Date Acquired: 03/08/2021 05/04/2021 N/A Weeks of Treatment: 6 3 N/A Wound Status: Open Open N/A Wound Recurrence: No No N/A Measurements L x W x D (cm) 1x0.8x0.1 1.6x0.8x0.1 N/A Area (cm) : 0.628 1.005 N/A Volume (cm) : 0.063 0.101 N/A % Reduction in Area: 71.90% 73.10% N/A % Reduction in Volume: 71.90% 73.00% N/A Classification: Full Thickness Without Exposed Partial Thickness N/A Support Structures Exudate Amount: Medium Medium N/A Exudate Type: Serosanguineous Serosanguineous N/A Exudate Color: red, brown red, brown N/A Granulation Amount: Medium (34-66%) Large (67-100%) N/A Granulation Quality: Red, Hyper-granulation Red, Hyper-granulation N/A Necrotic Amount: Medium (34-66%) Small (1-33%) N/A Exposed Structures: Fat Layer (Subcutaneous Tissue): Fat Layer (Subcutaneous Tissue): N/A Yes Yes Fascia: No Fascia: No Tendon: No Tendon: No Muscle: No Muscle: No Joint: No Joint: No Bone: No Bone: No Treatment Notes Electronic Signature(s) Signed: 05/28/2021 2:46:35 PM By: Donnamarie Poag Entered By: Donnamarie Poag on 05/28/2021 08:49:18 [name], [name] ([id number]) Dorris, [name] ([id number]) -------------------------------------------------------------------------------- [place] Details Patient Name:  [name], LATA. Date of Service: 05/28/2021 8:30 AM Medical Record Number: [id number] Patient Account Number: [id number] Date of Birth/Sex: [birth year] (61 y.o. M) Treating RN: Donnamarie Poag Primary Care Morio Widen: [name] Other Clinician: Referring Jerelle Virden: [name] Treating Piccola Arico/Extender: Skipper Cliche in Treatment: 6 Active Inactive Wound/Skin Impairment Nursing Diagnoses: Impaired tissue integrity Knowledge deficit related to smoking impact on wound healing Knowledge deficit related to ulceration/compromised skin integrity Goals: Patient/caregiver will verbalize understanding of skin care regimen Date Initiated: 04/16/2021 Date Inactivated: 04/24/2021 Target Resolution Date: 04/27/2021 Goal Status: Met Ulcer/skin breakdown will have a volume reduction of 30% by week 4 Date Initiated: 04/16/2021 Date Inactivated: 04/30/2021 Target Resolution Date: 05/14/2021 Goal Status: Met Ulcer/skin breakdown will have a volume reduction of 50% by week 8 Date Initiated: 04/16/2021 Target Resolution Date: 06/11/2021 Goal Status: Active Ulcer/skin breakdown will have a volume reduction of 80% by week 12 Date Initiated: 04/16/2021 Target Resolution Date: 07/09/2021 Goal Status: Active Ulcer/skin breakdown will heal within 14 weeks Date Initiated: 04/16/2021 Target Resolution Date: 07/23/2021 Goal Status: Active Interventions: Assess patient/caregiver ability to obtain necessary supplies Assess patient/caregiver ability to perform ulcer/skin care regimen upon admission and as needed Assess ulceration(s) every visit Notes: Electronic Signature(s) Signed: 05/28/2021 2:46:35 PM By: Donnamarie Poag Entered By: Donnamarie Poag on 05/28/2021 08:41:10 Zywicki, [name] ([id number]) -------------------------------------------------------------------------------- Pain Assessment Details Patient Name: [name]. Date of Service: 05/28/2021 8:30 AM Medical Record Number: [id number] Patient Account  Number: [id number] Date of Birth/Sex: 1961/01/11 (61 y.o. M) Treating RN: Donnamarie Poag Primary Care Declin Rajan: [name] Other Clinician: Referring Kaiyah Eber: [name] Treating Daphnee Preiss/Extender: Skipper Cliche in Treatment: 6 Active Problems Location of Pain Severity and Description of Pain Patient Has Paino No Site Locations Rate the pain. Current Pain Level: 0 Pain Management and Medication Current Pain Management: Electronic Signature(s) Signed: 05/28/2021 2:46:35 PM By: Donnamarie Poag Entered By: Donnamarie Poag on 05/28/2021 08:33:38 Brockmann, [name] ([id number]) -------------------------------------------------------------------------------- Patient/Caregiver Education Details Patient Name: [name]. Date of Service: 05/28/2021 8:30 AM Medical Record Number: [id number] Patient Account Number: [id number] Date of Birth/Gender: [birth year] (61 y.o. M) Treating RN: Donnamarie Poag Primary Care Physician: [name] Other Clinician: Referring Physician: [name] Treating Physician/Extender: Skipper Cliche in Treatment: 6 Education Assessment Education Provided To: Patient Education Topics Provided Basic Hygiene: Elevated Blood Sugar/ Impact on Healing: Venous: Wound/Skin Impairment: Electronic Signature(s) Signed: 05/28/2021 2:46:35 PM By: Donnamarie Poag Entered By: Donnamarie Poag on 05/28/2021 08:52:46 Borges, Judson F. ([id number]) -------------------------------------------------------------------------------- Wound Assessment Details  Patient Name: [name], [name]. Date of Service: 05/28/2021 8:30 AM Medical Record Number: [id number] Patient Account Number: [id number] Date of Birth/Sex: 1960/04/29 (61 y.o. M) Treating RN: Donnamarie Poag Primary Care Lovelle Lema: [name] Other Clinician: Referring Kennedey Digilio: [name] Treating Bhumi Godbey/Extender: Skipper Cliche in Treatment: 6 Wound Status Wound Number: 4 Primary Venous Leg Ulcer Etiology: Wound  Location: Right, Medial, Anterior Lower Leg Wound Open Wounding Event: Blister Status: Date Acquired: 03/08/2021 Comorbid Cataracts, Lymphedema, Asthma, Sleep Apnea, Weeks Of Treatment: 6 History: Arrhythmia, Congestive Heart Failure, Coronary Artery Clustered Wound: No Disease, Hypertension, Type II Diabetes, End Stage Renal Disease, Gout, Osteoarthritis, Neuropathy Photos Wound Measurements Length: (cm) 1 Width: (cm) 0.8 Depth: (cm) 0.1 Area: (cm) 0.628 Volume: (cm) 0.063 % Reduction in Area: 71.9% % Reduction in Volume: 71.9% Tunneling: No Undermining: No Wound Description Classification: Full Thickness Without Exposed Support Structures Exudate Amount: Medium Exudate Type: Serosanguineous Exudate Color: red, brown Foul Odor After Cleansing: No Slough/Fibrino Yes Wound Bed Granulation Amount: Medium (34-66%) Exposed Structure Granulation Quality: Red, Hyper-granulation Fascia Exposed: No Necrotic Amount: Medium (34-66%) Fat Layer (Subcutaneous Tissue) Exposed: Yes Necrotic Quality: Adherent Slough Tendon Exposed: No Muscle Exposed: No Joint Exposed: No Bone Exposed: No Treatment Notes Wound #4 (Lower Leg) Wound Laterality: Right, Medial, Anterior Cleanser Soap and Water Discharge Instruction: Gently cleanse wound with antibacterial soap, rinse and pat dry prior to dressing wounds Wound Cleanser [name], REICHARDT. ([id number]) Discharge Instruction: Wash your hands with soap and water. Remove old dressing, discard into plastic bag and place into trash. Cleanse the wound with Wound Cleanser prior to applying a clean dressing using gauze sponges, not tissues or cotton balls. Do not scrub or use excessive force. Pat dry using gauze sponges, not tissue or cotton balls. Peri-Wound Care Topical Primary Dressing Prisma 4.34 (in) Discharge Instruction: Moisten w/normal saline or sterile water; Cover wound as directed. Do not remove from wound bed. Secondary  Dressing (SILICONE BORDER) Zetuvit Plus SILICONE BORDER Dressing 4x4 (in/in) Secured With Compression Wrap Compression Stockings Add-Ons Electronic Signature(s) Signed: 05/28/2021 2:46:35 PM By: Donnamarie Poag Entered By: Donnamarie Poag on 05/28/2021 08:38:27 [name], [name] ([id number]) -------------------------------------------------------------------------------- Wound Assessment Details Patient Name: Carl Speller F. Date of Service: 05/28/2021 8:30 AM Medical Record Number: [id number] Patient Account Number: [id number] Date of Birth/Sex: 08-24-1960 (61 y.o. M) Treating RN: Donnamarie Poag Primary Care Sayge Salvato: [name] Other Clinician: Referring Jonetta Dagley: [name] Treating Yerick Eggebrecht/Extender: Skipper Cliche in Treatment: 6 Wound Status Wound Number: 6 Primary Venous Leg Ulcer Etiology: Wound Location: Right, Medial, Anterior Lower Leg Wound Open Wounding Event: Gradually Appeared Status: Date Acquired: 05/04/2021 Comorbid Cataracts, Lymphedema, Asthma, Sleep Apnea, Weeks Of Treatment: 3 History: Arrhythmia, Congestive Heart Failure, Coronary Artery Clustered Wound: No Disease, Hypertension, Type II Diabetes, End Stage Renal Disease, Gout, Osteoarthritis, Neuropathy Photos Wound Measurements Length: (cm) 1.6 Width: (cm) 0.8 Depth: (cm) 0.1 Area: (cm) 1.005 Volume: (cm) 0.101 % Reduction in Area: 73.1% % Reduction in Volume: 73% Tunneling: No Undermining: No Wound Description Classification: Partial Thickness Exudate Amount: Medium Exudate Type: Serosanguineous Exudate Color: red, brown Foul Odor After Cleansing: No Slough/Fibrino Yes Wound Bed Granulation Amount: Large (67-100%) Exposed Structure Granulation Quality: Red, Hyper-granulation Fascia Exposed: No Necrotic Amount: Small (1-33%) Fat Layer (Subcutaneous Tissue) Exposed: Yes Necrotic Quality: Adherent Slough Tendon Exposed: No Muscle Exposed: No Joint Exposed: No Bone Exposed:  No Treatment Notes Wound #6 (Lower Leg) Wound Laterality: Right, Medial, Anterior Cleanser Soap and Water Discharge Instruction: Gently cleanse wound with antibacterial soap, rinse and pat  dry prior to dressing wounds Wound Cleanser [name], [name] ([id number]) Discharge Instruction: Wash your hands with soap and water. Remove old dressing, discard into plastic bag and place into trash. Cleanse the wound with Wound Cleanser prior to applying a clean dressing using gauze sponges, not tissues or cotton balls. Do not scrub or use excessive force. Pat dry using gauze sponges, not tissue or cotton balls. Peri-Wound Care Topical Primary Dressing Prisma 4.34 (in) Discharge Instruction: Moisten w/normal saline or sterile water; Cover wound as directed. Do not remove from wound bed. Secondary Dressing (SILICONE BORDER) Zetuvit Plus SILICONE BORDER Dressing 4x4 (in/in) Secured With Compression Wrap Compression Stockings Add-Ons Electronic Signature(s) Signed: 05/28/2021 2:46:35 PM By: Donnamarie Poag Entered By: Donnamarie Poag on 05/28/2021 08:39:29 Gammell, [name] ([id number]) -------------------------------------------------------------------------------- [place] Details Patient Name: Carl Speller F. Date of Service: 05/28/2021 8:30 AM Medical Record Number: [id number] Patient Account Number: [id number] Date of Birth/Sex: 1960-11-05 (61 y.o. M) Treating RN: Donnamarie Poag Primary Care Margy Sumler: [name] Other Clinician: Referring Mahlet Jergens: [name] Treating Tamer Baughman/Extender: Skipper Cliche in Treatment: 6 Vital Signs Time Taken: 08:33 Temperature (F): 97.7 Height (in): 70 Pulse (bpm): 66 Weight (lbs): 270 Respiratory Rate (breaths/min): 16 Body Mass Index (BMI): 38.7 Blood Pressure (mmHg): 151/82 Reference Range: 80 - 120 mg / dl Electronic Signature(s) Signed: 05/28/2021 2:46:35 PM By: Donnamarie Poag Entered ByDonnamarie Poag on 05/28/2021 08:33:16

## 2021-05-28 NOTE — Progress Notes (Addendum)
TISHAWN, FRIEDHOFF ([id number]) Visit Report for 05/28/2021 Chief Complaint Document Details Patient Name: [name], [name]. Date of Service: 05/28/2021 8:30 AM Medical Record Number: [id number] Patient Account Number: [id number] Date of Birth/Sex: 1960-12-16 (61 y.o. M) Treating RN: Donnamarie Poag Primary Care Provider: [name] Other Clinician: Referring Provider: [name] Treating Provider/Extender: Skipper Cliche in Treatment: 6 Information Obtained from: Patient Chief Complaint Bilateral LE Ulcers Electronic Signature(s) Signed: 05/28/2021 8:46:24 AM By: Worthy Keeler PA-C Entered By: Worthy Keeler on 05/28/2021 08:46:24 ZEYAD, DELAGUILA ([id number]) -------------------------------------------------------------------------------- HPI Details Patient Name: [name], [name]. Date of Service: 05/28/2021 8:30 AM Medical Record Number: [id number] Patient Account Number: [id number] Date of Birth/Sex: Jul 13, 1960 (61 y.o. M) Treating RN: Donnamarie Poag Primary Care Provider: [name] Other Clinician: Referring Provider: [name] Treating Provider/Extender: Skipper Cliche in Treatment: 6 History of Present Illness Location: left anterior shin and left medial calf Quality: Patient reports experiencing a dull pain to affected area(s). Severity: Patient states wound are getting worse. Duration: Patient has had the wound for > 3 months prior to seeking treatment at the wound center Timing: Pain in wound is Intermittent (comes and goes Context: The wound appeared gradually over time Modifying Factors: Other treatment(s) tried include:had surgery on the left anterior shin for a hematoma which has been nonhealing for the last 8 months Associated Signs and Symptoms: Patient reports having increase swelling. HPI Description: 61 year old diabetic was last hemoglobin A1c was 7.2% has been reviewed today with 2 ulcerated areas in his left lower extremity which she's had for  about 8 months and the one most recently was started as a blister for 2 weeks. he has a past medical history of atrial fibrillation, coronary artery disease, CHF, chronic venous stasis dermatitis, diabetes mellitus, hypertension, sleep apnea and is status post kidney transplant in 2012. He is a former smoker and quit cigars in 1996. In the remote past he's had some arterial and venous duplex studies done but does not recall the results no was ever treated for venous insufficiency. 06/10/2016 -- -- had a office visit at [facility] seen by the physicianos assistant [facility]. Patient underwent an ABI which showed medial calcification, normal tibial artery Doppler waveform, PPG waveforms and toe brachial indices suggest normal arterial perfusion to bilateral lower extremities. right TBI was 0.97 and left was 0.84 Bilateral venous duplex was notable for venous reflux in the left greater saphenous vein and popliteal veins.. A left lower extremity endovenous laser ablation was recommended. 06/27/2016 -- I understand he spoke to his insurance company and there is a 3 month waiting period with conservative therapy prior to authorizing surgical intervention. 07/12/16- he is here for follow-up evaluation of a left lower extremity ulcer. He did bring his compression stockings today. He is voicing no complaints or concerns Readmission: 04/16/2021 upon evaluation today patient appears for reevaluation here in the clinic he was last seen in April 2018. At that time he was having issues with venous stasis ulcers and that again is what he is dealing with today as well. Fortunately I do not see any signs of active infection at this time which is great news and overall very pleased in that regard. Nonetheless he does have some areas of eschar which is going require debridement to clear away some of the necrotic debris so that we can hopefully get this healed as quickly as possible. That was  discussed with the patient today as well. The patient does have diabetes mellitus  type 2 for which she does have a hemoglobin A1c of 7.27 November 2020. He also has been on Keflex for what was felt to be cellulitis that seems to be doing better these wounds began on March 08, 2021. The patient also has a history of lymphedema, chronic venous insufficiency, and is a kidney transplant patient. 04/24/2021 upon evaluation today patient appears to be doing okay in regard to his wounds. Fortunately there does not appear to be any evidence of active infection locally nor systemically at this time which is good news. Nonetheless he was not able to keep the compression wraps on I think this is going be a little bit more problem he tells me his anxiety was to the point he just was not able to do it. I do understand but at the same time that is good to make things somewhat difficult to be honest. 04/30/2021 upon evaluation today patient appears to be doing well with regard to his wounds on his legs. Fortunately I do not see any signs of active infection locally or systemically at this time which is great news. No fevers, chills, nausea, vomiting, or diarrhea. 05/07/2021 upon evaluation patient appears to be doing well with regard to his wounds. He does have a new area on the right leg due to a skin tear as he was pulling up his compression socks. This obviously is a little frustrating for him everything else seems to be doing much better his blood flow was good I did print off the test today shows that he has excellent TBI's which is also news. 05/14/2021 upon evaluation today patient appears to be doing better in fact in regard to his left leg he is completely healed in regard to the right leg he still continues to have some open areas in 2 spots but both are doing better in my opinion which is great news. Fortunately I do not see any signs of active infection locally or systemically at this time which is great  news as well. 05/21/2021 upon evaluation today patient appears to be doing well with regard to his wounds. I feel like he is actually showing some signs of improvement which is great news. Fortunately I do not see any evidence of active infection locally nor systemically at this point which is great news. No fevers, chills, nausea, vomiting, or diarrhea. 05/28/2021 upon evaluation today patient to be doing awesome in regard to his wound. He has been tolerating the dressing changes without complication. Fortunately there does not appear to be any signs of active infection and both wounds are actually showing signs of excellent improvement which is great news. No fevers, chills, nausea, vomiting, or diarrhea. KEATS, KINGRY ([id number]) Electronic Signature(s) Signed: 05/28/2021 9:08:04 AM By: Worthy Keeler PA-C Entered By: Worthy Keeler on 05/28/2021 09:08:04 GUTHRIE, LEMME ([id number]) -------------------------------------------------------------------------------- Physical Exam Details Patient Name: JAVEL, [name]. Date of Service: 05/28/2021 8:30 AM Medical Record Number: [id number] Patient Account Number: [id number] Date of Birth/Sex: [birth year] (61 y.o. M) Treating RN: Donnamarie Poag Primary Care Provider: [name] Other Clinician: Referring Provider: [name] Treating Provider/Extender: Skipper Cliche in Treatment: 6 Constitutional Well-nourished and well-hydrated in no acute distress. Respiratory normal breathing without difficulty. Psychiatric this patient is able to make decisions and demonstrates good insight into disease process. Alert and Oriented x 3. pleasant and cooperative. Notes Upon inspection patient's wound bed actually showed signs of good granulation and epithelization at this point. Fortunately I do not see any evidence of  active infection locally nor systemically which is great news and overall I am extremely pleased with where we stand  today. Electronic Signature(s) Signed: 05/28/2021 9:19:00 AM By: Worthy Keeler PA-C Entered By: Worthy Keeler on 05/28/2021 09:19:00 Arvizu, Dorothea Glassman ([id number]) -------------------------------------------------------------------------------- Physician Orders Details Patient Name: RAJAT, STAVER. Date of Service: 05/28/2021 8:30 AM Medical Record Number: [id number] Patient Account Number: [id number] Date of Birth/Sex: 1961-02-02 (61 y.o. M) Treating RN: Donnamarie Poag Primary Care Provider: [name] Other Clinician: Referring Provider: [name] Treating Provider/Extender: Skipper Cliche in Treatment: 6 Verbal / Phone Orders: No Diagnosis Coding ICD-10 Coding Code Description E11.622 Type 2 diabetes mellitus with other skin ulcer I89.0 Lymphedema, not elsewhere classified L97.822 Non-pressure chronic ulcer of other part of left lower leg with fat layer exposed L97.812 Non-pressure chronic ulcer of other part of right lower leg with fat layer exposed I87.312 Chronic venous hypertension (idiopathic) with ulcer of left lower extremity I87.311 Chronic venous hypertension (idiopathic) with ulcer of right lower extremity Z94.0 Kidney transplant status Follow-up Appointments o Return Appointment in 1 week. o Nurse Visit as needed Bathing/ Shower/ Hygiene o May shower; gently cleanse wound with antibacterial soap, rinse and pat dry prior to dressing wounds o May shower with wound dressing protected with water repellent cover or cast protector. o No tub bath. Anesthetic (Use 'Patient Medications' Section for Anesthetic Order Entry) o Lidocaine applied to wound bed Edema Control - Lymphedema / Segmental Compressive Device / Other o Patient to wear own compression stockings. Remove compression stockings every night before going to bed and put on every morning when getting up. - refused compression wrap o Elevate leg(s) parallel to the floor when sitting. o  DO YOUR BEST to sleep in the bed at night. DO NOT sleep in your recliner. Long hours of sitting in a recliner leads to swelling of the legs and/or potential wounds on your backside. Additional Orders / Instructions o Follow Nutritious Diet and Increase Protein Intake - continue to monitor blood sugar Wound Treatment Wound #4 - Lower Leg Wound Laterality: Right, Medial, Anterior Cleanser: Soap and Water 3 x Per Week/15 Days Discharge Instructions: Gently cleanse wound with antibacterial soap, rinse and pat dry prior to dressing wounds Cleanser: Wound Cleanser 3 x Per Week/15 Days Discharge Instructions: Wash your hands with soap and water. Remove old dressing, discard into plastic bag and place into trash. Cleanse the wound with Wound Cleanser prior to applying a clean dressing using gauze sponges, not tissues or cotton balls. Do not scrub or use excessive force. Pat dry using gauze sponges, not tissue or cotton balls. Primary Dressing: Prisma 4.34 (in) (Generic) 3 x Per Week/15 Days Discharge Instructions: Moisten w/normal saline or sterile water; Cover wound as directed. Do not remove from wound bed. Secondary Dressing: Mepilex Border Flex, 4x4 (in/in) (DME) (Generic) 3 x Per Week/15 Days Discharge Instructions: Apply to wound as directed. Do not cut. Wound #6 - Lower Leg Wound Laterality: Right, Medial, Anterior Cleanser: Soap and Water 3 x Per Week/15 Days Discharge Instructions: Gently cleanse wound with antibacterial soap, rinse and pat dry prior to dressing wounds Cleanser: Wound Cleanser 3 x Per Week/15 Days SHELL, YANDOW ([id number]) Discharge Instructions: Wash your hands with soap and water. Remove old dressing, discard into plastic bag and place into trash. Cleanse the wound with Wound Cleanser prior to applying a clean dressing using gauze sponges, not tissues or cotton balls. Do not scrub or use excessive force. Pat dry using gauze sponges,  not tissue or cotton balls. Primary  Dressing: Prisma 4.34 (in) 3 x Per Week/15 Days Discharge Instructions: Moisten w/normal saline or sterile water; Cover wound as directed. Do not remove from wound bed. Secondary Dressing: Mepilex Border Flex, 4x4 (in/in) (DME) (Generic) 3 x Per Week/15 Days Discharge Instructions: Apply to wound as directed. Do not cut. Electronic Signature(s) Signed: 05/28/2021 2:46:35 PM By: Donnamarie Poag Signed: 05/28/2021 4:03:57 PM By: Worthy Keeler PA-C Entered By: Donnamarie Poag on 05/28/2021 14:05:03 [name] ([id number]) -------------------------------------------------------------------------------- Problem List Details Patient Name: KAMIN, [name]. Date of Service: 05/28/2021 8:30 AM Medical Record Number: [id number] Patient Account Number: [id number] Date of Birth/Sex: February 12, 1961 (61 y.o. M) Treating RN: Donnamarie Poag Primary Care Provider: [name] Other Clinician: Referring Provider: [name] Treating Provider/Extender: Skipper Cliche in Treatment: 6 Active Problems ICD-10 Encounter Code Description Active Date MDM Diagnosis E11.622 Type 2 diabetes mellitus with other skin ulcer 04/16/2021 No Yes I89.0 Lymphedema, not elsewhere classified 04/16/2021 No Yes L97.822 Non-pressure chronic ulcer of other part of left lower leg with fat layer 04/16/2021 No Yes exposed L97.812 Non-pressure chronic ulcer of other part of right lower leg with fat layer 04/16/2021 No Yes exposed I87.312 Chronic venous hypertension (idiopathic) with ulcer of left lower 04/16/2021 No Yes extremity I87.311 Chronic venous hypertension (idiopathic) with ulcer of right lower 04/16/2021 No Yes extremity Z94.0 Kidney transplant status 04/16/2021 No Yes Inactive Problems Resolved Problems Electronic Signature(s) Signed: 05/28/2021 8:46:18 AM By: Worthy Keeler PA-C Entered By: Worthy Keeler on 05/28/2021 08:46:18 Puzzo, Moe F.  ([id number]) -------------------------------------------------------------------------------- Progress Note Details Patient Name: Carl Speller F. Date of Service: 05/28/2021 8:30 AM Medical Record Number: [id number] Patient Account Number: [id number] Date of Birth/Sex: 03-Nov-1960 (61 y.o. M) Treating RN: Donnamarie Poag Primary Care Provider: [name] Other Clinician: Referring Provider: [name] Treating Provider/Extender: Skipper Cliche in Treatment: 6 Subjective Chief Complaint Information obtained from Patient Bilateral LE Ulcers History of Present Illness (HPI) The following HPI elements were documented for the patient's wound: Location: left anterior shin and left medial calf Quality: Patient reports experiencing a dull pain to affected area(s). Severity: Patient states wound are getting worse. Duration: Patient has had the wound for > 3 months prior to seeking treatment at the wound center Timing: Pain in wound is Intermittent (comes and goes Context: The wound appeared gradually over time Modifying Factors: Other treatment(s) tried include:had surgery on the left anterior shin for a hematoma which has been nonhealing for the last 8 months Associated Signs and Symptoms: Patient reports having increase swelling. 61 year old diabetic was last hemoglobin A1c was 7.2% has been reviewed today with 2 ulcerated areas in his left lower extremity which she's had for about 8 months and the one most recently was started as a blister for 2 weeks. he has a past medical history of atrial fibrillation, coronary artery disease, CHF, chronic venous stasis dermatitis, diabetes mellitus, hypertension, sleep apnea and is status post kidney transplant in 2012. He is a former smoker and quit cigars in 1996. In the remote past he's had some arterial and venous duplex studies done but does not recall the results no was ever treated for venous insufficiency. 06/10/2016 -- -- had a office  visit at [facility] seen by the physician s assistant Marcelle Overlie. Patient underwent an ABI which showed medial calcification, normal tibial artery Doppler waveform, PPG waveforms and toe brachial indices suggest normal arterial perfusion to bilateral lower extremities. right TBI was 0.97 and left  was 0.84 Bilateral venous duplex was notable for venous reflux in the left greater saphenous vein and popliteal veins.. A left lower extremity endovenous laser ablation was recommended. 06/27/2016 -- I understand he spoke to his insurance company and there is a 3 month waiting period with conservative therapy prior to authorizing surgical intervention. 07/12/16- he is here for follow-up evaluation of a left lower extremity ulcer. He did bring his compression stockings today. He is voicing no complaints or concerns Readmission: 04/16/2021 upon evaluation today patient appears for reevaluation here in the clinic he was last seen in April 2018. At that time he was having issues with venous stasis ulcers and that again is what he is dealing with today as well. Fortunately I do not see any signs of active infection at this time which is great news and overall very pleased in that regard. Nonetheless he does have some areas of eschar which is going require debridement to clear away some of the necrotic debris so that we can hopefully get this healed as quickly as possible. That was discussed with the patient today as well. The patient does have diabetes mellitus type 2 for which she does have a hemoglobin A1c of 7.27 November 2020. He also has been on Keflex for what was felt to be cellulitis that seems to be doing better these wounds began on March 08, 2021. The patient also has a history of lymphedema, chronic venous insufficiency, and is a kidney transplant patient. 04/24/2021 upon evaluation today patient appears to be doing okay in regard to his wounds. Fortunately there does not  appear to be any evidence of active infection locally nor systemically at this time which is good news. Nonetheless he was not able to keep the compression wraps on I think this is going be a little bit more problem he tells me his anxiety was to the point he just was not able to do it. I do understand but at the same time that is good to make things somewhat difficult to be honest. 04/30/2021 upon evaluation today patient appears to be doing well with regard to his wounds on his legs. Fortunately I do not see any signs of active infection locally or systemically at this time which is great news. No fevers, chills, nausea, vomiting, or diarrhea. 05/07/2021 upon evaluation patient appears to be doing well with regard to his wounds. He does have a new area on the right leg due to a skin tear as he was pulling up his compression socks. This obviously is a little frustrating for him everything else seems to be doing much better his blood flow was good I did print off the test today shows that he has excellent TBI's which is also news. 05/14/2021 upon evaluation today patient appears to be doing better in fact in regard to his left leg he is completely healed in regard to the right leg he still continues to have some open areas in 2 spots but both are doing better in my opinion which is great news. Fortunately I do not see any signs of active infection locally or systemically at this time which is great news as well. 05/21/2021 upon evaluation today patient appears to be doing well with regard to his wounds. I feel like he is actually showing some signs of improvement which is great news. Fortunately I do not see any evidence of active infection locally nor systemically at this point which is great news. No fevers, chills, nausea, vomiting, or diarrhea. Bostrom,  DUQUAN GILLOOLY ([id number]) 05/28/2021 upon evaluation today patient to be doing awesome in regard to his wound. He has been tolerating the dressing changes  without complication. Fortunately there does not appear to be any signs of active infection and both wounds are actually showing signs of excellent improvement which is great news. No fevers, chills, nausea, vomiting, or diarrhea. Objective Constitutional Well-nourished and well-hydrated in no acute distress. Vitals Time Taken: 8:33 AM, Height: 70 in, Weight: 270 lbs, BMI: 38.7, Temperature: 97.7 F, Pulse: 66 bpm, Respiratory Rate: 16 breaths/min, Blood Pressure: 151/82 mmHg. Respiratory normal breathing without difficulty. Psychiatric this patient is able to make decisions and demonstrates good insight into disease process. Alert and Oriented x 3. pleasant and cooperative. General Notes: Upon inspection patient's wound bed actually showed signs of good granulation and epithelization at this point. Fortunately I do not see any evidence of active infection locally nor systemically which is great news and overall I am extremely pleased with where we stand today. Integumentary (Hair, Skin) Wound #4 status is Open. Original cause of wound was Blister. The date acquired was: 03/08/2021. The wound has been in treatment 6 weeks. The wound is located on the Right,Medial,Anterior Lower Leg. The wound measures 1cm length x 0.8cm width x 0.1cm depth; 0.628cm^2 area and 0.063cm^3 volume. There is Fat Layer (Subcutaneous Tissue) exposed. There is no tunneling or undermining noted. There is a medium amount of serosanguineous drainage noted. There is medium (34-66%) red, hyper - granulation within the wound bed. There is a medium (34- 66%) amount of necrotic tissue within the wound bed including Adherent Slough. Wound #6 status is Open. Original cause of wound was Gradually Appeared. The date acquired was: 05/04/2021. The wound has been in treatment 3 weeks. The wound is located on the Right,Medial,Anterior Lower Leg. The wound measures 1.6cm length x 0.8cm width x 0.1cm depth; 1.005cm^2 area and 0.101cm^3  volume. There is Fat Layer (Subcutaneous Tissue) exposed. There is no tunneling or undermining noted. There is a medium amount of serosanguineous drainage noted. There is large (67-100%) red, hyper - granulation within the wound bed. There is a small (1-33%) amount of necrotic tissue within the wound bed including Adherent Slough. Assessment Active Problems ICD-10 Type 2 diabetes mellitus with other skin ulcer Lymphedema, not elsewhere classified Non-pressure chronic ulcer of other part of left lower leg with fat layer exposed Non-pressure chronic ulcer of other part of right lower leg with fat layer exposed Chronic venous hypertension (idiopathic) with ulcer of left lower extremity Chronic venous hypertension (idiopathic) with ulcer of right lower extremity Kidney transplant status Plan Follow-up Appointments: Return Appointment in 1 week. Nurse Visit as needed Bathing/ Shower/ Hygiene: May shower; gently cleanse wound with antibacterial soap, rinse and pat dry prior to dressing wounds May shower with wound dressing protected with water repellent cover or cast protector. TRAXTON, KOLENDA F. ([id number]) No tub bath. Anesthetic (Use 'Patient Medications' Section for Anesthetic Order Entry): Lidocaine applied to wound bed Edema Control - Lymphedema / Segmental Compressive Device / Other: Patient to wear own compression stockings. Remove compression stockings every night before going to bed and put on every morning when getting up. - refused compression wrap Elevate leg(s) parallel to the floor when sitting. DO YOUR BEST to sleep in the bed at night. DO NOT sleep in your recliner. Long hours of sitting in a recliner leads to swelling of the legs and/or potential wounds on your backside. Additional Orders / Instructions: Follow Nutritious Diet and Increase Protein Intake -  continue to monitor blood sugar WOUND #4: - Lower Leg Wound Laterality: Right, Medial, Anterior Cleanser: Soap and  Water 3 x Per Week/15 Days Discharge Instructions: Gently cleanse wound with antibacterial soap, rinse and pat dry prior to dressing wounds Cleanser: Wound Cleanser 3 x Per Week/15 Days Discharge Instructions: Wash your hands with soap and water. Remove old dressing, discard into plastic bag and place into trash. Cleanse the wound with Wound Cleanser prior to applying a clean dressing using gauze sponges, not tissues or cotton balls. Do not scrub or use excessive force. Pat dry using gauze sponges, not tissue or cotton balls. Primary Dressing: Prisma 4.34 (in) (DME) (Generic) 3 x Per Week/15 Days Discharge Instructions: Moisten w/normal saline or sterile water; Cover wound as directed. Do not remove from wound bed. Secondary Dressing: (SILICONE BORDER) Zetuvit Plus SILICONE BORDER Dressing 4x4 (in/in) (DME) (Generic) 3 x Per Week/15 Days WOUND #6: - Lower Leg Wound Laterality: Right, Medial, Anterior Cleanser: Soap and Water 3 x Per Week/15 Days Discharge Instructions: Gently cleanse wound with antibacterial soap, rinse and pat dry prior to dressing wounds Cleanser: Wound Cleanser 3 x Per Week/15 Days Discharge Instructions: Wash your hands with soap and water. Remove old dressing, discard into plastic bag and place into trash. Cleanse the wound with Wound Cleanser prior to applying a clean dressing using gauze sponges, not tissues or cotton balls. Do not scrub or use excessive force. Pat dry using gauze sponges, not tissue or cotton balls. Primary Dressing: Prisma 4.34 (in) 3 x Per Week/15 Days Discharge Instructions: Moisten w/normal saline or sterile water; Cover wound as directed. Do not remove from wound bed. Secondary Dressing: (SILICONE BORDER) Zetuvit Plus SILICONE BORDER Dressing 4x4 (in/in) (DME) (Generic) 3 x Per Week/15 Days 1. I am good recommend that we going continue with the wound care measures as before and the patient is in agreement the plan. Fortunately I feel like that he is  making excellent progress I think the silver collagen is making good work of the wound area and there is a lot of brand-new skin very thin around the edges of the wound which seem to be healing quite nicely. I am hopeful he will make great progress between now and even next week. 2. We will continue with the silicone border foam dressings to cover. 3. We will also continue with him using his own compression socks. We will see patient back for reevaluation in 1 week here in the clinic. If anything worsens or changes patient will contact our office for additional recommendations. Electronic Signature(s) Signed: 05/28/2021 9:19:33 AM By: Worthy Keeler PA-C Entered By: Worthy Keeler on 05/28/2021 09:19:33 Goddard, Dorothea Glassman ([id number]) -------------------------------------------------------------------------------- SuperBill Details Patient Name: [name]. Date of Service: 05/28/2021 Medical Record Number: [id number] Patient Account Number: [id number] Date of Birth/Sex: 05/19/1960 (61 y.o. M) Treating RN: Donnamarie Poag Primary Care Provider: [name] Other Clinician: Referring Provider: [name] Treating Provider/Extender: Skipper Cliche in Treatment: 6 Diagnosis Coding ICD-10 Codes Code Description [phone] Type 2 diabetes mellitus with other skin ulcer I89.0 Lymphedema, not elsewhere classified L97.822 Non-pressure chronic ulcer of other part of left lower leg with fat layer exposed L97.812 Non-pressure chronic ulcer of other part of right lower leg with fat layer exposed I87.312 Chronic venous hypertension (idiopathic) with ulcer of left lower extremity I87.311 Chronic venous hypertension (idiopathic) with ulcer of right lower extremity Z94.0 Kidney transplant status Facility Procedures CPT4 Code: 51761607 Description: 99213 - WOUND CARE VISIT-LEV 3 EST PT Modifier:  Quantity: 1 Physician Procedures CPT4 Code: 2248250 Description: 03704 - WC PHYS LEVEL 4 - EST  PT Modifier: Quantity: 1 CPT4 Code: Description: ICD-10 Diagnosis Description E11.622 Type 2 diabetes mellitus with other skin ulcer I89.0 Lymphedema, not elsewhere classified L97.822 Non-pressure chronic ulcer of other part of left lower leg with fat lay L97.812 Non-pressure chronic ulcer  of other part of right lower leg with fat la Modifier: er exposed yer exposed Quantity: Electronic Signature(s) Signed: 05/28/2021 9:24:46 AM By: Worthy Keeler PA-C Entered By: Worthy Keeler on 05/28/2021 09:24:45

## 2021-05-29 ENCOUNTER — Other Ambulatory Visit: Payer: Self-pay

## 2021-05-29 DIAGNOSIS — M720 Palmar fascial fibromatosis [Dupuytren]: Secondary | ICD-10-CM | POA: Diagnosis not present

## 2021-05-29 DIAGNOSIS — M79641 Pain in right hand: Secondary | ICD-10-CM | POA: Diagnosis not present

## 2021-05-29 MED FILL — Bupropion HCl Tab ER 12HR 150 MG: ORAL | 90 days supply | Qty: 180 | Fill #0 | Status: AC

## 2021-06-01 ENCOUNTER — Other Ambulatory Visit: Payer: Self-pay

## 2021-06-01 MED ORDER — PREDNISONE 10 MG PO TABS
ORAL_TABLET | ORAL | 0 refills | Status: DC
Start: 2021-06-01 — End: 2021-06-28
  Filled 2021-06-01: qty 30, 12d supply, fill #0

## 2021-06-03 NOTE — Progress Notes (Unsigned)
03/07/21- 54 yoM for sleep evaluation with hx OSA/ BIPAP. Medical problem list includes CAD/ CABG4, CHF, PAH, AFib/ Eliquis, Chronic Venous Insufficiency, Superficial Thrombophlebitis, OSA, OHS, Asthma moderate, Covid infection, DM2, Gout,  ESRD/ transplant, Depression, Obesity/ Bariatric Surgery, Rheumatoid Arthritis,  NPSG [place] 03/15/12- AHI 57.9/ hr, desaturation to 78.8%, body weight 310 lbs BIPAP 14/9, PS 4/ [facility]- ordered 10/17/20 by Volanda Napoleon, NP Epworth score- Body weight today- Download- AirView stopped in April- 5G issue-  Body weight today-268 lbs Covid vax- 1 Phizer, 3 Moderna Flu vax- had -----Wearing Bipap-doing good, works well Overnight oximetry on BIPAP / Room Air 11/29/20- </= 88% for 2 hrs 58 minutes Disabled. Plans to go ice fishing [place]. Occ cigar.Denies lung disease. Has had home O2 in past- [facility].  Occasional wheeze which usually responds to diuresis.  He says he cannot sleep without his BiPAP machine.  06/05/21- 7 yoM followed for OSA, complicated by CAD/ CABG4, CHF, PAH, AFib/ Eliquis, Chronic Venous Insufficiency, Superficial Thrombophlebitis, OSA, OHS, Asthma moderate, Covid infection, DM2, Gout,  ESRD/ transplant, Depression, Obesity/ Bariatric Surgery, Rheumatoid Arthritis, -Neb albuterol, singulair, Ventolin hfa, Flonase,  BIPAP 14/9, PS 4/ [facility]                                                 ? Home O2? Download- Body weight today- Covid vax- Flu vax-  ROS-see HPI   + = positive Constitutional:    weight loss, night sweats, fevers, chills, fatigue, lassitude. HEENT:    headaches, difficulty swallowing, tooth/dental problems, sore throat,       sneezing, itching, ear ache, nasal congestion, post nasal drip, snoring CV:    chest pain, orthopnea, PND, swelling in lower extremities, anasarca,                                   dizziness, palpitations Resp:   shortness of breath with exertion or at rest.                productive cough,   non-productive  cough, coughing up of blood.              change in color of mucus.  wheezing.   Skin:    rash or lesions. GI:  No-   heartburn, indigestion, abdominal pain, nausea, vomiting, diarrhea,                 change in bowel habits, loss of appetite GU: dysuria, change in color of urine, no urgency or frequency.   flank pain. MS:   joint pain, stiffness, decreased range of motion, back pain. Neuro-     nothing unusual Psych:  change in mood or affect.  depression or anxiety.   memory loss.  OBJ- Physical Exam General- Alert, Oriented, Affect-appropriate, Distress- none acute, +obese Skin- rash-none, lesions- none, excoriation- none Lymphadenopathy- none Head- atraumatic            Eyes- Gross vision intact, PERRLA, conjunctivae and secretions clear            Ears- +Hearing aids            Nose- Clear, no-Septal dev, mucus, polyps, erosion, perforation             Throat- Mallampati III-IV , mucosa clear , drainage- none,  tonsils- atrophic Neck- flexible , trachea midline, no stridor , thyroid nl, carotid no bruit Chest - symmetrical excursion , unlabored           Heart/CV- RRR , no murmur , no gallop  , no rub, nl s1 s2                           - JVD- none , edema- none, stasis changes- none, varices- none           Lung- clear to P&A, wheeze- none, cough- none , dullness-none, rub- none           Chest wall-  Abd- + kidney R abd Br/ Gen/ Rectal- Not done, not indicated Extrem- cyanosis- none, clubbing, none, atrophy- none, strength- nl Neuro- grossly intact to observation

## 2021-06-04 ENCOUNTER — Other Ambulatory Visit: Payer: Self-pay

## 2021-06-04 ENCOUNTER — Encounter (HOSPITAL_BASED_OUTPATIENT_CLINIC_OR_DEPARTMENT_OTHER): Payer: 59 | Admitting: Internal Medicine

## 2021-06-04 DIAGNOSIS — E1122 Type 2 diabetes mellitus with diabetic chronic kidney disease: Secondary | ICD-10-CM | POA: Diagnosis not present

## 2021-06-04 DIAGNOSIS — I890 Lymphedema, not elsewhere classified: Secondary | ICD-10-CM | POA: Diagnosis not present

## 2021-06-04 DIAGNOSIS — L97822 Non-pressure chronic ulcer of other part of left lower leg with fat layer exposed: Secondary | ICD-10-CM | POA: Diagnosis not present

## 2021-06-04 DIAGNOSIS — I87311 Chronic venous hypertension (idiopathic) with ulcer of right lower extremity: Secondary | ICD-10-CM | POA: Diagnosis not present

## 2021-06-04 DIAGNOSIS — I132 Hypertensive heart and chronic kidney disease with heart failure and with stage 5 chronic kidney disease, or end stage renal disease: Secondary | ICD-10-CM | POA: Diagnosis not present

## 2021-06-04 DIAGNOSIS — E11622 Type 2 diabetes mellitus with other skin ulcer: Secondary | ICD-10-CM

## 2021-06-04 DIAGNOSIS — E1140 Type 2 diabetes mellitus with diabetic neuropathy, unspecified: Secondary | ICD-10-CM | POA: Diagnosis not present

## 2021-06-04 DIAGNOSIS — L97812 Non-pressure chronic ulcer of other part of right lower leg with fat layer exposed: Secondary | ICD-10-CM

## 2021-06-04 DIAGNOSIS — N186 End stage renal disease: Secondary | ICD-10-CM | POA: Diagnosis not present

## 2021-06-04 NOTE — Progress Notes (Signed)
MACKY, GALIK ([id number]) Visit Report for 06/04/2021 Chief Complaint Document Details Patient Name: [name], [name]. Date of Service: 06/04/2021 8:30 AM Medical Record Number: [id number] Patient Account Number: [id number] Date of Birth/Sex: 05/03/1960 (61 y.o. M) Treating RN: Donnamarie Poag Primary Care Provider: [name] Other Clinician: Referring Provider: [name] Treating Provider/Extender: Yaakov Guthrie in Treatment: 7 Information Obtained from: Patient Chief Complaint Bilateral LE Ulcers Electronic Signature(s) Signed: 06/04/2021 9:05:59 AM By: Kalman Shan DO Entered By: Kalman Shan on 06/04/2021 09:02:51 Bellows, Dorothea Glassman ([id number]) -------------------------------------------------------------------------------- Debridement Details Patient Name: [name]. Date of Service: 06/04/2021 8:30 AM Medical Record Number: [id number] Patient Account Number: [id number] Date of Birth/Sex: [birth year] (61 y.o. M) Treating RN: Donnamarie Poag Primary Care Provider: [name] Other Clinician: Referring Provider: [name] Treating Provider/Extender: Yaakov Guthrie in Treatment: 7 Debridement Performed for Wound #6 Right,Medial,Anterior Lower Leg Assessment: Performed By: Physician Kalman Shan, MD Debridement Type: Debridement Severity of Tissue Pre Debridement: Fat layer exposed Level of Consciousness (Pre- Awake and Alert procedure): Pre-procedure Verification/Time Out Yes - 08:59 Taken: Start Time: 09:00 Pain Control: Lidocaine Total Area Debrided (L x W): 0.5 (cm) x 0.5 (cm) = 0.25 (cm) Tissue and other material Viable, Non-Viable, Slough, Subcutaneous, Slough debrided: Level: Skin/Subcutaneous Tissue Debridement Description: Excisional Instrument: Curette Bleeding: Minimum Hemostasis Achieved: Pressure Response to Treatment: Procedure was tolerated well Level of Consciousness (Post- Awake and  Alert procedure): Post Debridement Measurements of Total Wound Length: (cm) 0.5 Width: (cm) 0.5 Depth: (cm) 0.1 Volume: (cm) 0.02 Character of Wound/Ulcer Post Debridement: Improved Severity of Tissue Post Debridement: Fat layer exposed Post Procedure Diagnosis Same as Pre-procedure Electronic Signature(s) Signed: 06/04/2021 9:05:59 AM By: Kalman Shan DO Signed: 06/04/2021 4:06:03 PM By: Donnamarie Poag Entered By: Donnamarie Poag on 06/04/2021 09:01:08 [name] ([id number]) -------------------------------------------------------------------------------- Debridement Details Patient Name: Carl Speller F. Date of Service: 06/04/2021 8:30 AM Medical Record Number: [id number] Patient Account Number: [id number] Date of Birth/Sex: 05-19-1960 (61 y.o. M) Treating RN: Donnamarie Poag Primary Care Provider: [name] Other Clinician: Referring Provider: [name] Treating Provider/Extender: Yaakov Guthrie in Treatment: 7 Debridement Performed for Wound #4 Right,Medial,Anterior Lower Leg Assessment: Performed By: Physician Kalman Shan, MD Debridement Type: Debridement Severity of Tissue Pre Debridement: Limited to breakdown of skin Level of Consciousness (Pre- Awake and Alert procedure): Pre-procedure Verification/Time Out Yes - 08:59 Taken: Start Time: 09:00 Pain Control: Lidocaine Total Area Debrided (L x W): 0.6 (cm) x 0.6 (cm) = 0.36 (cm) Tissue and other material Viable, Non-Viable, Slough, Subcutaneous, Slough debrided: Level: Skin/Subcutaneous Tissue Debridement Description: Excisional Instrument: Curette Bleeding: Minimum Hemostasis Achieved: Pressure Response to Treatment: Procedure was tolerated well Level of Consciousness (Post- Awake and Alert procedure): Post Debridement Measurements of Total Wound Length: (cm) 0.6 Width: (cm) 0.6 Depth: (cm) 0.1 Volume: (cm) 0.028 Character of Wound/Ulcer Post Debridement: Improved Severity of  Tissue Post Debridement: Fat layer exposed Post Procedure Diagnosis Same as Pre-procedure Electronic Signature(s) Signed: 06/04/2021 9:05:59 AM By: Kalman Shan DO Signed: 06/04/2021 4:06:03 PM By: Donnamarie Poag Entered By: Donnamarie Poag on 06/04/2021 09:02:20 [place], Dorothea Glassman ([id number]) -------------------------------------------------------------------------------- HPI Details Patient Name: Carl Speller F. Date of Service: 06/04/2021 8:30 AM Medical Record Number: [id number] Patient Account Number: [id number] Date of Birth/Sex: 03-06-1961 (61 y.o. M) Treating RN: Donnamarie Poag Primary Care Provider: [name] Other Clinician: Referring Provider: [name] Treating Provider/Extender: Yaakov Guthrie in Treatment: 7 History of Present Illness Location: left anterior shin and left medial calf Quality: Patient reports experiencing a dull pain  to affected area(s). Severity: Patient states wound are getting worse. Duration: Patient has had the wound for > 3 months prior to seeking treatment at the wound center Timing: Pain in wound is Intermittent (comes and goes Context: The wound appeared gradually over time Modifying Factors: Other treatment(s) tried include:had surgery on the left anterior shin for a hematoma which has been nonhealing for the last 8 months Associated Signs and Symptoms: Patient reports having increase swelling. HPI Description: 61 year old diabetic was last hemoglobin A1c was 7.2% has been reviewed today with 2 ulcerated areas in his left lower extremity which she's had for about 8 months and the one most recently was started as a blister for 2 weeks. he has a past medical history of atrial fibrillation, coronary artery disease, CHF, chronic venous stasis dermatitis, diabetes mellitus, hypertension, sleep apnea and is status post kidney transplant in 2012. He is a former smoker and quit cigars in 1996. In the remote past he's had some arterial and  venous duplex studies done but does not recall the results no was ever treated for venous insufficiency. 06/10/2016 -- -- had a office visit at [facility] seen by the physicianos assistant [facility]. Patient underwent an ABI which showed medial calcification, normal tibial artery Doppler waveform, PPG waveforms and toe brachial indices suggest normal arterial perfusion to bilateral lower extremities. right TBI was 0.97 and left was 0.84 Bilateral venous duplex was notable for venous reflux in the left greater saphenous vein and popliteal veins.. A left lower extremity endovenous laser ablation was recommended. 06/27/2016 -- I understand he spoke to his insurance company and there is a 3 month waiting period with conservative therapy prior to authorizing surgical intervention. 07/12/16- he is here for follow-up evaluation of a left lower extremity ulcer. He did bring his compression stockings today. He is voicing no complaints or concerns Readmission: 04/16/2021 upon evaluation today patient appears for reevaluation here in the clinic he was last seen in April 2018. At that time he was having issues with venous stasis ulcers and that again is what he is dealing with today as well. Fortunately I do not see any signs of active infection at this time which is great news and overall very pleased in that regard. Nonetheless he does have some areas of eschar which is going require debridement to clear away some of the necrotic debris so that we can hopefully get this healed as quickly as possible. That was discussed with the patient today as well. The patient does have diabetes mellitus type 2 for which she does have a hemoglobin A1c of 7.27 November 2020. He also has been on Keflex for what was felt to be cellulitis that seems to be doing better these wounds began on March 08, 2021. The patient also has a history of lymphedema, chronic venous insufficiency, and is a kidney  transplant patient. 04/24/2021 upon evaluation today patient appears to be doing okay in regard to his wounds. Fortunately there does not appear to be any evidence of active infection locally nor systemically at this time which is good news. Nonetheless he was not able to keep the compression wraps on I think this is going be a little bit more problem he tells me his anxiety was to the point he just was not able to do it. I do understand but at the same time that is good to make things somewhat difficult to be honest. 04/30/2021 upon evaluation today patient appears to be doing well with regard  to his wounds on his legs. Fortunately I do not see any signs of active infection locally or systemically at this time which is great news. No fevers, chills, nausea, vomiting, or diarrhea. 05/07/2021 upon evaluation patient appears to be doing well with regard to his wounds. He does have a new area on the right leg due to a skin tear as he was pulling up his compression socks. This obviously is a little frustrating for him everything else seems to be doing much better his blood flow was good I did print off the test today shows that he has excellent TBI's which is also news. 05/14/2021 upon evaluation today patient appears to be doing better in fact in regard to his left leg he is completely healed in regard to the right leg he still continues to have some open areas in 2 spots but both are doing better in my opinion which is great news. Fortunately I do not see any signs of active infection locally or systemically at this time which is great news as well. 05/21/2021 upon evaluation today patient appears to be doing well with regard to his wounds. I feel like he is actually showing some signs of improvement which is great news. Fortunately I do not see any evidence of active infection locally nor systemically at this point which is great news. No fevers, chills, nausea, vomiting, or diarrhea. 05/28/2021 upon  evaluation today patient to be doing awesome in regard to his wound. He has been tolerating the dressing changes without complication. Fortunately there does not appear to be any signs of active infection and both wounds are actually showing signs of excellent improvement which is great news. No fevers, chills, nausea, vomiting, or diarrhea. 3/13; patient presents for follow-up. He has no issues or complaints today. Denies signs of infection. He has been using collagen for dressing changes and using his compression stocking daily. SATORU, MILICH ([id number]) Electronic Signature(s) Signed: 06/04/2021 9:05:59 AM By: Kalman Shan DO Entered By: Kalman Shan on 06/04/2021 09:03:27 JAYDN, MOSCATO ([id number]) -------------------------------------------------------------------------------- Physical Exam Details Patient Name: COLLEN, VINCENT. Date of Service: 06/04/2021 8:30 AM Medical Record Number: [id number] Patient Account Number: [id number] Date of Birth/Sex: 12-30-1960 (61 y.o. M) Treating RN: Donnamarie Poag Primary Care Provider: [name] Other Clinician: Referring Provider: [name] Treating Provider/Extender: Yaakov Guthrie in Treatment: 7 Constitutional . Cardiovascular . Psychiatric . Notes Right lower extremity: 2 open wounds with granulation tissue and scant nonviable tissue. No signs of surrounding infection. Electronic Signature(s) Signed: 06/04/2021 9:05:59 AM By: Kalman Shan DO Entered By: Kalman Shan on 06/04/2021 09:03:53 Dusing, Dorothea Glassman ([id number]) -------------------------------------------------------------------------------- Physician Orders Details Patient Name: FAUST, THORINGTON. Date of Service: 06/04/2021 8:30 AM Medical Record Number: [id number] Patient Account Number: [id number] Date of Birth/Sex: 1960/03/28 (61 y.o. M) Treating RN: Donnamarie Poag Primary Care Provider: [name] Other Clinician: Referring Provider:  [name] Treating Provider/Extender: Yaakov Guthrie in Treatment: 7 Verbal / Phone Orders: No Diagnosis Coding Follow-up Appointments o Return Appointment in 1 week. o Nurse Visit as needed Bathing/ Shower/ Hygiene o May shower; gently cleanse wound with antibacterial soap, rinse and pat dry prior to dressing wounds o May shower with wound dressing protected with water repellent cover or cast protector. o No tub bath. Anesthetic (Use 'Patient Medications' Section for Anesthetic Order Entry) o Lidocaine applied to wound bed Edema Control - Lymphedema / Segmental Compressive Device / Other o Patient to wear own compression stockings. Remove compression stockings every night before going  to bed and put on every morning when getting up. - refused compression wrap o Elevate leg(s) parallel to the floor when sitting. o DO YOUR BEST to sleep in the bed at night. DO NOT sleep in your recliner. Long hours of sitting in a recliner leads to swelling of the legs and/or potential wounds on your backside. Additional Orders / Instructions o Follow Nutritious Diet and Increase Protein Intake - continue to monitor blood sugar Wound Treatment Wound #4 - Lower Leg Wound Laterality: Right, Medial, Anterior Cleanser: Soap and Water 3 x Per Week/15 Days Discharge Instructions: Gently cleanse wound with antibacterial soap, rinse and pat dry prior to dressing wounds Cleanser: Wound Cleanser 3 x Per Week/15 Days Discharge Instructions: Wash your hands with soap and water. Remove old dressing, discard into plastic bag and place into trash. Cleanse the wound with Wound Cleanser prior to applying a clean dressing using gauze sponges, not tissues or cotton balls. Do not scrub or use excessive force. Pat dry using gauze sponges, not tissue or cotton balls. Primary Dressing: Prisma 4.34 (in) (Generic) 3 x Per Week/15 Days Discharge Instructions: Moisten w/normal saline or sterile  water; Cover wound as directed. Do not remove from wound bed. Secondary Dressing: Mepilex Border Flex, 4x4 (in/in) (Generic) 3 x Per Week/15 Days Discharge Instructions: Apply to wound as directed. Do not cut. Wound #6 - Lower Leg Wound Laterality: Right, Medial, Anterior Cleanser: Soap and Water 3 x Per Week/15 Days Discharge Instructions: Gently cleanse wound with antibacterial soap, rinse and pat dry prior to dressing wounds Cleanser: Wound Cleanser 3 x Per Week/15 Days Discharge Instructions: Wash your hands with soap and water. Remove old dressing, discard into plastic bag and place into trash. Cleanse the wound with Wound Cleanser prior to applying a clean dressing using gauze sponges, not tissues or cotton balls. Do not scrub or use excessive force. Pat dry using gauze sponges, not tissue or cotton balls. Primary Dressing: Prisma 4.34 (in) 3 x Per Week/15 Days Discharge Instructions: Moisten w/normal saline or sterile water; Cover wound as directed. Do not remove from wound bed. Secondary Dressing: Mepilex Border Flex, 4x4 (in/in) (Generic) 3 x Per Week/15 Days Discharge Instructions: Apply to wound as directed. Do not cut. CHOICE, KLEINSASSER ([id number]) Electronic Signature(s) Signed: 06/04/2021 9:16:25 AM By: Kalman Shan DO Signed: 06/04/2021 4:06:03 PM By: Donnamarie Poag Entered By: Donnamarie Poag on 06/04/2021 09:06:20 WARNELL, RASNIC ([id number]) -------------------------------------------------------------------------------- Problem List Details Patient Name: SABIR, CHARTERS. Date of Service: 06/04/2021 8:30 AM Medical Record Number: [id number] Patient Account Number: [id number] Date of Birth/Sex: 11-02-1960 (61 y.o. M) Treating RN: Donnamarie Poag Primary Care Provider: [name] Other Clinician: Referring Provider: [name] Treating Provider/Extender: Yaakov Guthrie in Treatment: 7 Active Problems ICD-10 Encounter Code Description Active Date  MDM Diagnosis E11.622 Type 2 diabetes mellitus with other skin ulcer 04/16/2021 No Yes I89.0 Lymphedema, not elsewhere classified 04/16/2021 No Yes L97.822 Non-pressure chronic ulcer of other part of left lower leg with fat layer 04/16/2021 No Yes exposed L97.812 Non-pressure chronic ulcer of other part of right lower leg with fat layer 04/16/2021 No Yes exposed I87.312 Chronic venous hypertension (idiopathic) with ulcer of left lower 04/16/2021 No Yes extremity I87.311 Chronic venous hypertension (idiopathic) with ulcer of right lower 04/16/2021 No Yes extremity Z94.0 Kidney transplant status 04/16/2021 No Yes Inactive Problems Resolved Problems Electronic Signature(s) Signed: 06/04/2021 9:05:59 AM By: Kalman Shan DO Entered By: Kalman Shan on 06/04/2021 09:02:43 Lippe, [name] F. ([id number]) -------------------------------------------------------------------------------- Progress Note Details Patient  Name: Madaris, Landin F. Date of Service: 06/04/2021 8:30 AM Medical Record Number: [id number] Patient Account Number: [id number] Date of Birth/Sex: Aug 01, 1960 (61 y.o. M) Treating RN: Donnamarie Poag Primary Care Provider: [name] Other Clinician: Referring Provider: [name] Treating Provider/Extender: Yaakov Guthrie in Treatment: 7 Subjective Chief Complaint Information obtained from Patient Bilateral LE Ulcers History of Present Illness (HPI) The following HPI elements were documented for the patient's wound: Location: left anterior shin and left medial calf Quality: Patient reports experiencing a dull pain to affected area(s). Severity: Patient states wound are getting worse. Duration: Patient has had the wound for > 3 months prior to seeking treatment at the wound center Timing: Pain in wound is Intermittent (comes and goes Context: The wound appeared gradually over time Modifying Factors: Other treatment(s) tried include:had surgery on the left anterior  shin for a hematoma which has been nonhealing for the last 8 months Associated Signs and Symptoms: Patient reports having increase swelling. 61 year old diabetic was last hemoglobin A1c was 7.2% has been reviewed today with 2 ulcerated areas in his left lower extremity which she's had for about 8 months and the one most recently was started as a blister for 2 weeks. he has a past medical history of atrial fibrillation, coronary artery disease, CHF, chronic venous stasis dermatitis, diabetes mellitus, hypertension, sleep apnea and is status post kidney transplant in 2012. He is a former smoker and quit cigars in 1996. In the remote past he's had some arterial and venous duplex studies done but does not recall the results no was ever treated for venous insufficiency. 06/10/2016 -- -- had a office visit at [facility] seen by the physician s assistant Marcelle Overlie. Patient underwent an ABI which showed medial calcification, normal tibial artery Doppler waveform, PPG waveforms and toe brachial indices suggest normal arterial perfusion to bilateral lower extremities. right TBI was 0.97 and left was 0.84 Bilateral venous duplex was notable for venous reflux in the left greater saphenous vein and popliteal veins.. A left lower extremity endovenous laser ablation was recommended. 06/27/2016 -- I understand he spoke to his insurance company and there is a 3 month waiting period with conservative therapy prior to authorizing surgical intervention. 07/12/16- he is here for follow-up evaluation of a left lower extremity ulcer. He did bring his compression stockings today. He is voicing no complaints or concerns Readmission: 04/16/2021 upon evaluation today patient appears for reevaluation here in the clinic he was last seen in April 2018. At that time he was having issues with venous stasis ulcers and that again is what he is dealing with today as well. Fortunately I do not see any signs  of active infection at this time which is great news and overall very pleased in that regard. Nonetheless he does have some areas of eschar which is going require debridement to clear away some of the necrotic debris so that we can hopefully get this healed as quickly as possible. That was discussed with the patient today as well. The patient does have diabetes mellitus type 2 for which she does have a hemoglobin A1c of 7.27 November 2020. He also has been on Keflex for what was felt to be cellulitis that seems to be doing better these wounds began on March 08, 2021. The patient also has a history of lymphedema, chronic venous insufficiency, and is a kidney transplant patient. 04/24/2021 upon evaluation today patient appears to be doing okay in regard to his wounds. Fortunately there does not appear  to be any evidence of active infection locally nor systemically at this time which is good news. Nonetheless he was not able to keep the compression wraps on I think this is going be a little bit more problem he tells me his anxiety was to the point he just was not able to do it. I do understand but at the same time that is good to make things somewhat difficult to be honest. 04/30/2021 upon evaluation today patient appears to be doing well with regard to his wounds on his legs. Fortunately I do not see any signs of active infection locally or systemically at this time which is great news. No fevers, chills, nausea, vomiting, or diarrhea. 05/07/2021 upon evaluation patient appears to be doing well with regard to his wounds. He does have a new area on the right leg due to a skin tear as he was pulling up his compression socks. This obviously is a little frustrating for him everything else seems to be doing much better his blood flow was good I did print off the test today shows that he has excellent TBI's which is also news. 05/14/2021 upon evaluation today patient appears to be doing better in fact in  regard to his left leg he is completely healed in regard to the right leg he still continues to have some open areas in 2 spots but both are doing better in my opinion which is great news. Fortunately I do not see any signs of active infection locally or systemically at this time which is great news as well. 05/21/2021 upon evaluation today patient appears to be doing well with regard to his wounds. I feel like he is actually showing some signs of improvement which is great news. Fortunately I do not see any evidence of active infection locally nor systemically at this point which is great news. No fevers, chills, nausea, vomiting, or diarrhea. KHAMRON, GELLERT ([id number]) 05/28/2021 upon evaluation today patient to be doing awesome in regard to his wound. He has been tolerating the dressing changes without complication. Fortunately there does not appear to be any signs of active infection and both wounds are actually showing signs of excellent improvement which is great news. No fevers, chills, nausea, vomiting, or diarrhea. 3/13; patient presents for follow-up. He has no issues or complaints today. Denies signs of infection. He has been using collagen for dressing changes and using his compression stocking daily. Objective Constitutional Vitals Time Taken: 8:37 AM, Height: 70 in, Weight: 270 lbs, BMI: 38.7, Temperature: 97.5 F, Pulse: 71 bpm, Respiratory Rate: 16 breaths/min, Blood Pressure: 151/78 mmHg. General Notes: Right lower extremity: 2 open wounds with granulation tissue and scant nonviable tissue. No signs of surrounding infection. Integumentary (Hair, Skin) Wound #4 status is Open. Original cause of wound was Blister. The date acquired was: 03/08/2021. The wound has been in treatment 7 weeks. The wound is located on the Right,Medial,Anterior Lower Leg. The wound measures 0.6cm length x 0.6cm width x 0.1cm depth; 0.283cm^2 area and 0.028cm^3 volume. There is Fat Layer (Subcutaneous  Tissue) exposed. There is no tunneling or undermining noted. There is a medium amount of serosanguineous drainage noted. There is large (67-100%) red, hyper - granulation within the wound bed. There is a small (1-33%) amount of necrotic tissue within the wound bed including Adherent Slough. Wound #6 status is Open. Original cause of wound was Gradually Appeared. The date acquired was: 05/04/2021. The wound has been in treatment 4 weeks. The wound is located on the  Right,Medial,Anterior Lower Leg. The wound measures 0.5cm length x 0.5cm width x 0.1cm depth; 0.196cm^2 area and 0.02cm^3 volume. There is Fat Layer (Subcutaneous Tissue) exposed. There is no tunneling or undermining noted. There is a medium amount of serosanguineous drainage noted. There is large (67-100%) red, hyper - granulation within the wound bed. There is a small (1-33%) amount of necrotic tissue within the wound bed including Adherent Slough. Assessment Active Problems ICD-10 Type 2 diabetes mellitus with other skin ulcer Lymphedema, not elsewhere classified Non-pressure chronic ulcer of other part of left lower leg with fat layer exposed Non-pressure chronic ulcer of other part of right lower leg with fat layer exposed Chronic venous hypertension (idiopathic) with ulcer of left lower extremity Chronic venous hypertension (idiopathic) with ulcer of right lower extremity Kidney transplant status Patient's wounds have shown improvement in size and appearance since last clinic visit. I debrided nonviable tissue. No surrounding signs of infection. I recommended continuing collagen with compression stocking daily. Follow-up in 1 week. Procedures Wound #4 Pre-procedure diagnosis of Wound #4 is a Venous Leg Ulcer located on the Right,Medial,Anterior Lower Leg .Severity of Tissue Pre Debridement is: Limited to breakdown of skin. There was a Excisional Skin/Subcutaneous Tissue Debridement with a total area of 0.36 sq cm performed  by Kalman Shan, MD. With the following instrument(s): Curette to remove Viable and Non-Viable tissue/material. Material removed includes Subcutaneous Tissue and Slough and after achieving pain control using Lidocaine. A time out was conducted at 08:59, prior to the start of the procedure. A Minimum amount of bleeding was controlled with Pressure. The procedure was tolerated well. Post Debridement Measurements: 0.6cm length x 0.6cm width x 0.1cm depth; 0.028cm^3 volume. Character of Wound/Ulcer Post Debridement is improved. Severity of Tissue Post Debridement is: Fat layer exposed. Post procedure Diagnosis Wound #4: Same as Pre-Procedure ENOCH, MOFFA. ([id number]) Wound #6 Pre-procedure diagnosis of Wound #6 is a Venous Leg Ulcer located on the Right,Medial,Anterior Lower Leg .Severity of Tissue Pre Debridement is: Fat layer exposed. There was a Excisional Skin/Subcutaneous Tissue Debridement with a total area of 0.25 sq cm performed by Kalman Shan, MD. With the following instrument(s): Curette to remove Viable and Non-Viable tissue/material. Material removed includes Subcutaneous Tissue and Slough and after achieving pain control using Lidocaine. A time out was conducted at 08:59, prior to the start of the procedure. A Minimum amount of bleeding was controlled with Pressure. The procedure was tolerated well. Post Debridement Measurements: 0.5cm length x 0.5cm width x 0.1cm depth; 0.02cm^3 volume. Character of Wound/Ulcer Post Debridement is improved. Severity of Tissue Post Debridement is: Fat layer exposed. Post procedure Diagnosis Wound #6: Same as Pre-Procedure Plan 1. In office sharp debridement 2. Collagen 3. Compression stocking daily Electronic Signature(s) Signed: 06/04/2021 9:05:59 AM By: Kalman Shan DO Entered By: Kalman Shan on 06/04/2021 09:04:51 Tigges, Dorothea Glassman  ([id number]) -------------------------------------------------------------------------------- SuperBill Details Patient Name: [name]. Date of Service: 06/04/2021 Medical Record Number: [id number] Patient Account Number: [id number] Date of Birth/Sex: 1960-09-07 (61 y.o. M) Treating RN: Donnamarie Poag Primary Care Provider: [name] Other Clinician: Referring Provider: [name] Treating Provider/Extender: Yaakov Guthrie in Treatment: 7 Diagnosis Coding ICD-10 Codes Code Description E11.622 Type 2 diabetes mellitus with other skin ulcer I89.0 Lymphedema, not elsewhere classified L97.822 Non-pressure chronic ulcer of other part of left lower leg with fat layer exposed L97.812 Non-pressure chronic ulcer of other part of right lower leg with fat layer exposed I87.312 Chronic venous hypertension (idiopathic) with ulcer of left lower extremity I87.311 Chronic  venous hypertension (idiopathic) with ulcer of right lower extremity Z94.0 Kidney transplant status Facility Procedures CPT4 Code: 53005110 Description: 21117 - DEB SUBQ TISSUE 20 SQ CM/< Modifier: Quantity: 1 CPT4 Code: Description: ICD-10 Diagnosis Description B56.701 Non-pressure chronic ulcer of other part of right lower leg with fat lay E11.622 Type 2 diabetes mellitus with other skin ulcer Modifier: er exposed Quantity: Physician Procedures CPT4 Code: 4103013 Description: 14388 - WC PHYS SUBQ TISS 20 SQ CM Modifier: Quantity: 1 CPT4 Code: Description: ICD-10 Diagnosis Description L97.812 Non-pressure chronic ulcer of other part of right lower leg with fat lay E11.622 Type 2 diabetes mellitus with other skin ulcer Modifier: er exposed Quantity: Electronic Signature(s) Signed: 06/04/2021 9:16:25 AM By: Kalman Shan DO Signed: 06/04/2021 4:06:03 PM By: Donnamarie Poag Previous Signature: 06/04/2021 9:05:59 AM Version By: Kalman Shan DO Entered By: Donnamarie Poag on 06/04/2021 09:07:47

## 2021-06-04 NOTE — Progress Notes (Signed)
DAVINCI, GLOTFELTY ([id number]) Visit Report for 06/04/2021 Arrival Information Details Patient Name: [name], [name]. Date of Service: 06/04/2021 8:30 AM Medical Record Number: [id number] Patient Account Number: [id number] Date of Birth/Sex: [birth year] (61 y.o. M) Treating RN: Donnamarie Poag Primary Care Eiliana Drone: [name] Other Clinician: Referring Praise Dolecki: [name] Treating Jasilyn Holderman/Extender: Yaakov Guthrie in Treatment: 7 Visit Information History Since Last Visit Added or deleted any medications: No Patient Arrived: Ambulatory Had a fall or experienced change in No Arrival Time: 08:35 activities of daily living that may affect Accompanied By: self risk of falls: Transfer Assistance: None Hospitalized since last visit: No Patient Identification Verified: Yes Has Dressing in Place as Prescribed: Yes Secondary Verification Process Completed: Yes Has Compression in Place as Prescribed: Yes Patient Requires Transmission-Based No Pain Present Now: No Precautions: Patient Has Alerts: Yes Patient Alerts: Patient on Blood Thinner DIABETIC [place] left leg 04/16/21 EL[facility]QUIS AVVS 05/02/21 [place] ABI TBI L 0.90; R 1.03 Electronic Signature(s) Signed: 06/04/2021 4:06:03 PM By: Donnamarie Poag Entered By: Donnamarie Poag on 06/04/2021 08:36:53 [place], [name] ([id number]) -------------------------------------------------------------------------------- Clinic Level of Care Assessment Details Patient Name: [name]. Date of Service: 06/04/2021 8:30 AM Medical Record Number: [id number] Patient Account Number: [id number] Date of Birth/Sex: 09-Aug-1960 (61 y.o. M) Treating RN: Donnamarie Poag Primary Care Juda Toepfer: [name] Other Clinician: Referring Carliss Porcaro: [name] Treating Sandip Power/Extender: Yaakov Guthrie in Treatment: 7 Clinic Level of Care Assessment Items TOOL 4 Quantity Score _0  - Use when only an EandM is performed on FOLLOW-UP visit 0 ASSESSMENTS -  Nursing Assessment / Reassessment _1  - Reassessment of Co-morbidities (includes updates in patient status) 0 _2  - 0 Reassessment of Adherence to Treatment Plan ASSESSMENTS - Wound and Skin Assessment / Reassessment _3  - Simple Wound Assessment / Reassessment - one wound 0 X- 2 5 Complex Wound Assessment / Reassessment - multiple wounds _4  - 0 Dermatologic / Skin Assessment (not related to wound area) ASSESSMENTS - Focused Assessment _5  - Circumferential Edema Measurements - multi extremities 0 _6  - 0 Nutritional Assessment / Counseling / Intervention _7  - 0 Lower Extremity Assessment (monofilament, tuning fork, pulses) _8  - 0 Peripheral Arterial Disease Assessment (using hand held doppler) ASSESSMENTS - Ostomy and/or Continence Assessment and Care _9  - Incontinence Assessment and Management 0 _10  - 0 Ostomy Care Assessment and Management (repouching, etc.) PROCESS - Coordination of Care X - Simple Patient / Family Education for ongoing care 1 15 _11  - 0 Complex (extensive) Patient / Family Education for ongoing care _12  - 0 Staff obtains Programmer, systems, Records, Test Results / Process Orders _13  - 0 Staff telephones HHA, Nursing Homes / Clarify orders / etc _14  - 0 Routine Transfer to another Facility (non-emergent condition) _15  - 0 Routine Hospital Admission (non-emergent condition) _16  - 0 New Admissions / Biomedical engineer / Ordering NPWT, Apligraf, etc. _17  - 0 Emergency Hospital Admission (emergent condition) _18  - 0 Simple Discharge Coordination _19  - 0 Complex (extensive) Discharge Coordination PROCESS - Special Needs _20  - Pediatric / Minor Patient Management 0 _21  - 0 Isolation Patient Management _22  - 0 Hearing / Language / Visual special needs _23  - 0 Assessment of Community assistance (transportation, D/C planning, etc.) _24  - 0 Additional assistance / Altered mentation _25  - 0 Support Surface(s) Assessment (bed, cushion, seat, etc.) INTERVENTIONS - Wound Cleansing  / Measurement [name], [name] ([id number]) _26  - 0 Simple Wound Cleansing - one wound X- 2 5 Complex Wound Cleansing - multiple wounds X- 1 5 Wound Imaging (photographs - any  number of wounds) _0  - 0 Wound Tracing (instead of photographs) _1  - 0 Simple Wound Measurement - one wound X- 2 5 Complex Wound Measurement - multiple wounds INTERVENTIONS - Wound Dressings X - Small Wound Dressing one or multiple wounds 2 10 _2  - 0 Medium Wound Dressing one or multiple wounds _3  - 0 Large Wound Dressing one or multiple wounds X- 1 5 Application of Medications - topical <XBJYNWGNFAOZHYQM>_5<\/HQIONGEXBMWUXLKG>_4  - 0 Application of Medications - injection INTERVENTIONS - Miscellaneous _5  - External ear exam 0 _6  - 0 Specimen Collection (cultures, biopsies, blood, body fluids, etc.) _7  - 0 Specimen(s) / Culture(s) sent or taken to Lab for analysis _8  - 0 Patient Transfer (multiple staff / Civil Service fast streamer / Similar devices) _9  - 0 Simple Staple / Suture removal (25 or less) _10  - 0 Complex Staple / Suture removal (26 or more) _11  - 0 Hypo / Hyperglycemic Management (close monitor of Blood Glucose) _12  - 0 Ankle / Brachial Index (ABI) - do not check if billed separately X- 1 5 Vital Signs Has the patient been seen at the hospital within the last three years: Yes Total Score: 80 Level Of Care: New/Established - Level 3 Electronic Signature(s) Signed: 06/04/2021 4:06:03 PM By: Donnamarie Poag Entered By: Donnamarie Poag on 06/04/2021 09:07:33 Liendo, [name] ([id number]) -------------------------------------------------------------------------------- Encounter Discharge Information Details Patient Name: [name]. Date of Service: 06/04/2021 8:30 AM Medical Record Number: [id number] Patient Account Number: [id number] Date of Birth/Sex: [birth year] (61 y.o. M) Treating RN: Donnamarie Poag Primary Care Akaiya Touchette: [name] Other Clinician: Referring Mei Suits: [name] Treating Shraddha Lebron/Extender: Yaakov Guthrie in  Treatment: 7 Encounter Discharge Information Items Post Procedure Vitals Discharge Condition: Stable Temperature (F): 97.5 Ambulatory Status: Ambulatory Pulse (bpm): 71 Discharge Destination: Home Respiratory Rate (breaths/min): 16 Transportation: Private Auto Blood Pressure (mmHg): 151/78 Accompanied By: self Schedule Follow-up Appointment: Yes Clinical Summary of Care: Electronic Signature(s) Signed: 06/04/2021 4:06:03 PM By: Donnamarie Poag Entered By: Donnamarie Poag on 06/04/2021 09:09:04 GARCIADorothea [name] ([id number]) -------------------------------------------------------------------------------- Lower Extremity Assessment Details Patient Name: Carl Speller F. Date of Service: 06/04/2021 8:30 AM Medical Record Number: [id number] Patient Account Number: [id number] Date of Birth/Sex: 1961/01/23 (61 y.o. M) Treating RN: Donnamarie Poag Primary Care Alainah Phang: [name] Other Clinician: Referring Radonna Bracher: [name] Treating Tnia Anglada/Extender: Yaakov Guthrie in Treatment: 7 Edema Assessment Assessed: Shirlyn Goltz: No] Patrice Paradise: Yes] [Left: Edema] [Right: :] Calf Left: Right: Point of Measurement: 30 cm From Medial Instep 39 cm Ankle Left: Right: Point of Measurement: 10 cm From Medial Instep 24 cm Knee To Floor Left: Right: From Medial Instep 40 cm Vascular Assessment Pulses: Dorsalis Pedis Palpable: [Right:Yes] Electronic Signature(s) Signed: 06/04/2021 4:06:03 PM By: Donnamarie Poag Entered By: Donnamarie Poag on 06/04/2021 08:44:42 Hurrell, Cordaryl F. ([id number]) -------------------------------------------------------------------------------- Multi Wound Chart Details Patient Name: Carl Speller F. Date of Service: 06/04/2021 8:30 AM Medical Record Number: [id number] Patient Account Number: [id number] Date of Birth/Sex: Jun 05, 1960 (61 y.o. M) Treating RN: Donnamarie Poag Primary Care Jeanmarie Mccowen: [name] Other Clinician: Referring Brette Cast: [name] Treating  Gresia Isidoro/Extender: Yaakov Guthrie in Treatment: 7 Vital Signs Height(in): 70 Pulse(bpm): 25 Weight(lbs): 270 Blood Pressure(mmHg): 151/78 Body Mass Index(BMI): 38.7 Temperature(F): 97.5 Respiratory Rate(breaths/min): 16 Photos: [N/A:N/A] Wound Location: Right, Medial, Anterior Lower Leg Right, Medial, Anterior Lower Leg N/A Wounding Event: Blister Gradually Appeared N/A Primary Etiology: Venous Leg Ulcer Venous Leg Ulcer N/A Comorbid History: Cataracts, Lymphedema, Asthma, Cataracts, Lymphedema, Asthma, N/A Sleep Apnea, Arrhythmia, Sleep Apnea, Arrhythmia, Congestive Heart Failure, Coronary Congestive Heart Failure, Coronary Artery  Disease, Hypertension, Type Artery Disease, Hypertension, Type II Diabetes, End Stage Renal II Diabetes, End Stage Renal Disease, Gout, Osteoarthritis, Disease, Gout, Osteoarthritis, Neuropathy Neuropathy Date Acquired: 03/08/2021 05/04/2021 N/A Weeks of Treatment: 7 4 N/A Wound Status: Open Open N/A Wound Recurrence: No No N/A Measurements L x W x D (cm) 0.6x0.6x0.1 0.5x0.5x0.1 N/A Area (cm) : 0.283 0.196 N/A Volume (cm) : 0.028 0.02 N/A % Reduction in Area: 87.40% 94.80% N/A % Reduction in Volume: 87.50% 94.70% N/A Classification: Full Thickness Without Exposed Partial Thickness N/A Support Structures Exudate Amount: Medium Medium N/A Exudate Type: Serosanguineous Serosanguineous N/A Exudate Color: red, brown red, brown N/A Granulation Amount: Large (67-100%) Large (67-100%) N/A Granulation Quality: Red, Hyper-granulation Red, Hyper-granulation N/A Necrotic Amount: Small (1-33%) Small (1-33%) N/A Exposed Structures: Fat Layer (Subcutaneous Tissue): Fat Layer (Subcutaneous Tissue): N/A Yes Yes Fascia: No Fascia: No Tendon: No Tendon: No Muscle: No Muscle: No Joint: No Joint: No Bone: No Bone: No Treatment Notes Electronic Signature(s) Signed: 06/04/2021 4:06:03 PM By: Donnamarie Poag Entered By: Donnamarie Poag on 06/04/2021  08:45:37 [name], [name] ([id number]) Doyle, [name] ([id number]) -------------------------------------------------------------------------------- [place] Details Patient Name: [name], [name]. Date of Service: 06/04/2021 8:30 AM Medical Record Number: [id number] Patient Account Number: [id number] Date of Birth/Sex: 09/21/1960 (61 y.o. M) Treating RN: Donnamarie Poag Primary Care Larron Armor: [name] Other Clinician: Referring Eeshan Verbrugge: [name] Treating Salia Cangemi/Extender: Yaakov Guthrie in Treatment: 7 Active Inactive Wound/Skin Impairment Nursing Diagnoses: Impaired tissue integrity Knowledge deficit related to smoking impact on wound healing Knowledge deficit related to ulceration/compromised skin integrity Goals: Patient/caregiver will verbalize understanding of skin care regimen Date Initiated: 04/16/2021 Date Inactivated: 04/24/2021 Target Resolution Date: 04/27/2021 Goal Status: Met Ulcer/skin breakdown will have a volume reduction of 30% by week 4 Date Initiated: 04/16/2021 Date Inactivated: 04/30/2021 Target Resolution Date: 05/14/2021 Goal Status: Met Ulcer/skin breakdown will have a volume reduction of 50% by week 8 Date Initiated: 04/16/2021 Date Inactivated: 06/04/2021 Target Resolution Date: 06/11/2021 Goal Status: Met Ulcer/skin breakdown will have a volume reduction of 80% by week 12 Date Initiated: 04/16/2021 Target Resolution Date: 07/09/2021 Goal Status: Active Ulcer/skin breakdown will heal within 14 weeks Date Initiated: 04/16/2021 Target Resolution Date: 07/23/2021 Goal Status: Active Interventions: Assess patient/caregiver ability to obtain necessary supplies Assess patient/caregiver ability to perform ulcer/skin care regimen upon admission and as needed Assess ulceration(s) every visit Notes: Electronic Signature(s) Signed: 06/04/2021 4:06:03 PM By: Donnamarie Poag Entered By: Donnamarie Poag on 06/04/2021 08:45:21 [name], [name]  ([id number]) -------------------------------------------------------------------------------- Pain Assessment Details Patient Name: [name]. Date of Service: 06/04/2021 8:30 AM Medical Record Number: [id number] Patient Account Number: [id number] Date of Birth/Sex: [birth year] (61 y.o. M) Treating RN: Donnamarie Poag Primary Care Korena Nass: [name] Other Clinician: Referring Levester Waldridge: [name] Treating Braydan Marriott/Extender: Yaakov Guthrie in Treatment: 7 Active Problems Location of Pain Severity and Description of Pain Patient Has Paino No Site Locations Rate the pain. Current Pain Level: 0 Pain Management and Medication Current Pain Management: Electronic Signature(s) Signed: 06/04/2021 4:06:03 PM By: Donnamarie Poag Entered By: Donnamarie Poag on 06/04/2021 08:37:51 [name], [name] ([id number]) -------------------------------------------------------------------------------- Patient/Caregiver Education Details Patient Name: [name]. Date of Service: 06/04/2021 8:30 AM Medical Record Number: [id number] Patient Account Number: [id number] Date of Birth/Gender: 22-Aug-1960 (61 y.o. M) Treating RN: Donnamarie Poag Primary Care Physician: [name] Other Clinician: Referring Physician: [name] Treating Physician/Extender: Yaakov Guthrie in Treatment: 7 Education Assessment Education Provided To: Patient Education Topics Provided Basic Hygiene: Wound/Skin Impairment: Electronic Signature(s) Signed: 06/04/2021  4:06:03 PM By: Donnamarie Poag Entered ByDonnamarie Poag on 06/04/2021 09:07:59 [name], [name] ([id number]) -------------------------------------------------------------------------------- Wound Assessment Details Patient Name: [name], [name] Date of Service: 06/04/2021 8:30 AM Medical Record Number: [id number] Patient Account Number: [id number] Date of Birth/Sex: 14-Apr-1960 (61 y.o. M) Treating RN: Donnamarie Poag Primary Care Mashell Sieben: [name] Other Clinician: Referring Tattiana Fakhouri: [name] Treating Tija Biss/Extender: Yaakov Guthrie in Treatment: 7 Wound Status Wound Number: 4 Primary Venous Leg Ulcer Etiology: Wound Location: Right, Medial, Anterior Lower Leg Wound Open Wounding Event: Blister Status: Date Acquired: 03/08/2021 Comorbid Cataracts, Lymphedema, Asthma, Sleep Apnea, Weeks Of Treatment: 7 History: Arrhythmia, Congestive Heart Failure, Coronary Artery Clustered Wound: No Disease, Hypertension, Type II Diabetes, End Stage Renal Disease, Gout, Osteoarthritis, Neuropathy Photos Wound Measurements Length: (cm) 0.6 Width: (cm) 0.6 Depth: (cm) 0.1 Area: (cm) 0.283 Volume: (cm) 0.028 % Reduction in Area: 87.4% % Reduction in Volume: 87.5% Tunneling: No Undermining: No Wound Description Classification: Full Thickness Without Exposed Support Structures Exudate Amount: Medium Exudate Type: Serosanguineous Exudate Color: red, brown Foul Odor After Cleansing: No Slough/Fibrino Yes Wound Bed Granulation Amount: Large (67-100%) Exposed Structure Granulation Quality: Red, Hyper-granulation Fascia Exposed: No Necrotic Amount: Small (1-33%) Fat Layer (Subcutaneous Tissue) Exposed: Yes Necrotic Quality: Adherent Slough Tendon Exposed: No Muscle Exposed: No Joint Exposed: No Bone Exposed: No Treatment Notes Wound #4 (Lower Leg) Wound Laterality: Right, Medial, Anterior Cleanser Soap and Water Discharge Instruction: Gently cleanse wound with antibacterial soap, rinse and pat dry prior to dressing wounds Wound Cleanser [name], STEEDMAN. ([id number]) Discharge Instruction: Wash your hands with soap and water. Remove old dressing, discard into plastic bag and place into trash. Cleanse the wound with Wound Cleanser prior to applying a clean dressing using gauze sponges, not tissues or cotton balls. Do not scrub or use excessive force. Pat dry using gauze sponges, not tissue or cotton  balls. Peri-Wound Care Topical Primary Dressing Prisma 4.34 (in) Discharge Instruction: Moisten w/normal saline or sterile water; Cover wound as directed. Do not remove from wound bed. Secondary Dressing Mepilex Border Flex, 4x4 (in/in) Discharge Instruction: Apply to wound as directed. Do not cut. Secured With Compression Wrap Compression Stockings Environmental education officer) Signed: 06/04/2021 4:06:03 PM By: Donnamarie Poag Entered By: Donnamarie Poag on 06/04/2021 08:43:08 Furnari, [name] ([id number]) -------------------------------------------------------------------------------- Wound Assessment Details Patient Name: Carl Speller F. Date of Service: 06/04/2021 8:30 AM Medical Record Number: [id number] Patient Account Number: [id number] Date of Birth/Sex: Mar 01, 1961 (61 y.o. M) Treating RN: Donnamarie Poag Primary Care Aseret Hoffman: [name] Other Clinician: Referring Caymen Dubray: [name] Treating Merle Cirelli/Extender: Yaakov Guthrie in Treatment: 7 Wound Status Wound Number: 6 Primary Venous Leg Ulcer Etiology: Wound Location: Right, Medial, Anterior Lower Leg Wound Open Wounding Event: Gradually Appeared Status: Date Acquired: 05/04/2021 Comorbid Cataracts, Lymphedema, Asthma, Sleep Apnea, Weeks Of Treatment: 4 History: Arrhythmia, Congestive Heart Failure, Coronary Artery Clustered Wound: No Disease, Hypertension, Type II Diabetes, End Stage Renal Disease, Gout, Osteoarthritis, Neuropathy Photos Wound Measurements Length: (cm) 0.5 Width: (cm) 0.5 Depth: (cm) 0.1 Area: (cm) 0.196 Volume: (cm) 0.02 % Reduction in Area: 94.8% % Reduction in Volume: 94.7% Tunneling: No Undermining: No Wound Description Classification: Partial Thickness Exudate Amount: Medium Exudate Type: Serosanguineous Exudate Color: red, brown Foul Odor After Cleansing: No Slough/Fibrino Yes Wound Bed Granulation Amount: Large (67-100%) Exposed Structure Granulation Quality: Red,  Hyper-granulation Fascia Exposed: No Necrotic Amount: Small (1-33%) Fat Layer (Subcutaneous Tissue) Exposed: Yes Necrotic Quality: Adherent Slough Tendon Exposed: No Muscle Exposed: No Joint Exposed: No Bone Exposed: No  Treatment Notes Wound #6 (Lower Leg) Wound Laterality: Right, Medial, Anterior Cleanser Soap and Water Discharge Instruction: Gently cleanse wound with antibacterial soap, rinse and pat dry prior to dressing wounds Wound Cleanser [name], [name] ([id number]) Discharge Instruction: Wash your hands with soap and water. Remove old dressing, discard into plastic bag and place into trash. Cleanse the wound with Wound Cleanser prior to applying a clean dressing using gauze sponges, not tissues or cotton balls. Do not scrub or use excessive force. Pat dry using gauze sponges, not tissue or cotton balls. Peri-Wound Care Topical Primary Dressing Prisma 4.34 (in) Discharge Instruction: Moisten w/normal saline or sterile water; Cover wound as directed. Do not remove from wound bed. Secondary Dressing Mepilex Border Flex, 4x4 (in/in) Discharge Instruction: Apply to wound as directed. Do not cut. Secured With Compression Wrap Compression Stockings Environmental education officer) Signed: 06/04/2021 4:06:03 PM By: Donnamarie Poag Entered By: Donnamarie Poag on 06/04/2021 08:44:14 Benoist, [name] ([id number]) -------------------------------------------------------------------------------- Vitals Details Patient Name: [name]. Date of Service: 06/04/2021 8:30 AM Medical Record Number: [id number] Patient Account Number: [id number] Date of Birth/Sex: [birth year] (61 y.o. M) Treating RN: Donnamarie Poag Primary Care Bekki Tavenner: [name] Other Clinician: Referring Edman Lipsey: [name] Treating Jamael Hoffmann/Extender: Yaakov Guthrie in Treatment: 7 Vital Signs Time Taken: 08:37 Temperature (F): 97.5 Height (in): 70 Pulse (bpm): 71 Weight (lbs): 270 Respiratory Rate  (breaths/min): 16 Body Mass Index (BMI): 38.7 Blood Pressure (mmHg): 151/78 Reference Range: 80 - 120 mg / dl Electronic Signature(s) Signed: 06/04/2021 4:06:03 PM By: Donnamarie Poag Entered ByDonnamarie Poag on 06/04/2021 08:37:25

## 2021-06-05 ENCOUNTER — Other Ambulatory Visit: Payer: Self-pay

## 2021-06-05 ENCOUNTER — Encounter: Payer: 59 | Admitting: Psychology

## 2021-06-05 ENCOUNTER — Encounter: Payer: Self-pay | Admitting: Internal Medicine

## 2021-06-05 ENCOUNTER — Telehealth: Payer: Self-pay | Admitting: Internal Medicine

## 2021-06-05 ENCOUNTER — Ambulatory Visit (INDEPENDENT_AMBULATORY_CARE_PROVIDER_SITE_OTHER): Payer: 59 | Admitting: Internal Medicine

## 2021-06-05 VITALS — BP 140/80 | HR 63 | Temp 97.8°F | Ht 70.5 in | Wt 272.4 lb

## 2021-06-05 DIAGNOSIS — G4734 Idiopathic sleep related nonobstructive alveolar hypoventilation: Secondary | ICD-10-CM | POA: Diagnosis not present

## 2021-06-05 DIAGNOSIS — I2510 Atherosclerotic heart disease of native coronary artery without angina pectoris: Secondary | ICD-10-CM

## 2021-06-05 DIAGNOSIS — G4733 Obstructive sleep apnea (adult) (pediatric): Secondary | ICD-10-CM

## 2021-06-05 NOTE — Patient Instructions (Signed)
We cn continue BIPAP 14/9, PS 4 ? ?Order- Overnight oximetry on room air on BIPAP ?Please contact us for results about 2 weeks after this is done. We may need to either add oxygen, or Medicare may require [name] do a sleep study  ?

## 2021-06-05 NOTE — Assessment & Plan Note (Signed)
We discussed this again at this visit. ?Plan-reorder overnight oximetry on room air with BiPAP for recheck.  If persistent we will add home O2 for sleep. ?

## 2021-06-05 NOTE — Telephone Encounter (Signed)
ATC patient to see what medication he is referring to. There is no mention of a medication in OV notes or AVS that I saw from today. No answer and no vm available  ?

## 2021-06-05 NOTE — Assessment & Plan Note (Signed)
He continues to benefit from BiPAP with good compliance and control.  We are asking adapt to add an SD card ?Plan-continue BiPAP 14/9, PS 4 ?

## 2021-06-06 ENCOUNTER — Other Ambulatory Visit: Payer: Self-pay

## 2021-06-06 NOTE — Telephone Encounter (Signed)
My intention was to wait on trying trazodone until we re-check his oxygen levels with the overnight oximetry test which has been ordered. ?

## 2021-06-06 NOTE — Telephone Encounter (Signed)
Called patient and he states that Dr Annamaria Boots suggested TRAZODONE to help him sleep at night. I advised patient that I would message Dr Annamaria Boots and see if he would like to order this medication because nothing was stated in AVS.  ? ?Dr Annamaria Boots please advise  ?

## 2021-06-06 NOTE — Telephone Encounter (Signed)
Called palled and informed him that Dr Annamaria Boots is wanting to hold off on the sleep medication until we get the results back from his ONO test that was ordered. Nothing further needed  ?

## 2021-06-08 DIAGNOSIS — E662 Morbid (severe) obesity with alveolar hypoventilation: Secondary | ICD-10-CM | POA: Diagnosis not present

## 2021-06-08 DIAGNOSIS — G4733 Obstructive sleep apnea (adult) (pediatric): Secondary | ICD-10-CM | POA: Diagnosis not present

## 2021-06-11 ENCOUNTER — Other Ambulatory Visit: Payer: Self-pay

## 2021-06-11 ENCOUNTER — Encounter: Payer: 59 | Admitting: Physician Assistant

## 2021-06-11 DIAGNOSIS — L97812 Non-pressure chronic ulcer of other part of right lower leg with fat layer exposed: Secondary | ICD-10-CM | POA: Diagnosis not present

## 2021-06-11 DIAGNOSIS — E1122 Type 2 diabetes mellitus with diabetic chronic kidney disease: Secondary | ICD-10-CM | POA: Diagnosis not present

## 2021-06-11 DIAGNOSIS — I87311 Chronic venous hypertension (idiopathic) with ulcer of right lower extremity: Secondary | ICD-10-CM | POA: Diagnosis not present

## 2021-06-11 DIAGNOSIS — L97822 Non-pressure chronic ulcer of other part of left lower leg with fat layer exposed: Secondary | ICD-10-CM | POA: Diagnosis not present

## 2021-06-11 DIAGNOSIS — I890 Lymphedema, not elsewhere classified: Secondary | ICD-10-CM | POA: Diagnosis not present

## 2021-06-11 DIAGNOSIS — I132 Hypertensive heart and chronic kidney disease with heart failure and with stage 5 chronic kidney disease, or end stage renal disease: Secondary | ICD-10-CM | POA: Diagnosis not present

## 2021-06-11 DIAGNOSIS — N186 End stage renal disease: Secondary | ICD-10-CM | POA: Diagnosis not present

## 2021-06-11 DIAGNOSIS — E11622 Type 2 diabetes mellitus with other skin ulcer: Secondary | ICD-10-CM | POA: Diagnosis not present

## 2021-06-11 DIAGNOSIS — E1140 Type 2 diabetes mellitus with diabetic neuropathy, unspecified: Secondary | ICD-10-CM | POA: Diagnosis not present

## 2021-06-11 MED ORDER — COLCHICINE 0.6 MG PO TABS
ORAL_TABLET | ORAL | 1 refills | Status: DC
  Filled 2021-06-11: qty 30, 60d supply, fill #0
  Filled 2021-08-07: qty 30, 60d supply, fill #1

## 2021-06-11 NOTE — Progress Notes (Signed)
DAINEL, ARCIDIACONO ([id number]) ?Visit Report for 06/11/2021 ?Arrival Information Details ?Patient Name: [name], [name] ?Date of Service: 06/11/2021 8:30 AM ?Medical Record Number: [id number] ?Patient Account Number: [id number] ?Date of Birth/Sex: [birth year] (61 y.o. M) ?Treating RN: Donnamarie Poag ?Primary Care Sabah Zucco: [name] Other Clinician: ?Referring Elliott Quade: [name] ?Treating Berenis Corter/Extender: Jeri Cos ?Weeks in Treatment: 8 ?Visit Information History Since Last Visit ?Added or deleted any medications: No ?Patient Arrived: Ambulatory ?Had a fall or experienced change in No ?Arrival Time: 08:38 ?activities of daily living that may affect ?Accompanied By: self ?risk of falls: ?Transfer Assistance: None ?Hospitalized since last visit: No ?Patient Identification Verified: Yes ?Has Dressing in Place as Prescribed: Yes ?Secondary Verification Process Completed: Yes ?Pain Present Now: No ?Patient Requires Transmission-Based No ?Precautions: ?Patient Has Alerts: Yes ?Patient Alerts: Patient on Blood ?Thinner ?DIABETIC ?[place] left leg 04/16/21 ?ELIQUIS ?[facility] 05/02/21 [place] ABI ?TBI L 0.90; R 1.03 ?Electronic Signature(s) ?Signed: 06/11/2021 4:17:36 PM By: Donnamarie Poag ?Entered ByDonnamarie Poag on 06/11/2021 08:43:32 ?KEONDRICK, DILKS F. ([id number]) ?-------------------------------------------------------------------------------- ?Clinic Level of Care Assessment Details ?Patient Name: [name] ?Date of Service: 06/11/2021 8:30 AM ?Medical Record Number: [id number] ?Patient Account Number: [id number] ?Date of Birth/Sex: 26-Jan-1961 (61 y.o. M) ?Treating RN: Donnamarie Poag ?Primary Care Shawnn Bouillon: [name] Other Clinician: ?Referring Kriste Broman: [name] ?Treating Mikea Quadros/Extender: Jeri Cos ?Weeks in Treatment: 8 ?Clinic Level of Care Assessment Items ?TOOL 4 Quantity Score ?_0  - Use when only an EandM is performed on FOLLOW-UP visit 0 ?ASSESSMENTS - Nursing Assessment / Reassessment ?_1  - Reassessment of  Co-morbidities (includes updates in patient status) 0 ?_2  - 0 ?Reassessment of Adherence to Treatment Plan ?ASSESSMENTS - Wound and Skin Assessment / Reassessment ?_3  - Simple Wound Assessment / Reassessment - one wound 0 ?X- 2 5 ?Complex Wound Assessment / Reassessment - multiple wounds ?_4  - 0 ?Dermatologic / Skin Assessment (not related to wound area) ?ASSESSMENTS - Focused Assessment ?_5  - Circumferential Edema Measurements - multi extremities 0 ?_6  - 0 ?Nutritional Assessment / Counseling / Intervention ?_7  - 0 ?Lower Extremity Assessment (monofilament, tuning fork, pulses) ?_8  - 0 ?Peripheral Arterial Disease Assessment (using hand held doppler) ?ASSESSMENTS - Ostomy and/or Continence Assessment and Care ?_9  - Incontinence Assessment and Management 0 ?_10  - 0 ?Ostomy Care Assessment and Management (repouching, etc.) ?PROCESS - Coordination of Care ?X - Simple Patient / Family Education for ongoing care 1 15 ?_11  - 0 ?Complex (extensive) Patient / Family Education for ongoing care ?_12  - 0 ?Staff obtains Consents, Records, Test Results / Process Orders ?_13  - 0 ?Staff telephones HHA, Nursing Homes / Clarify orders / etc ?_14  - 0 ?Routine Transfer to another Facility (non-emergent condition) ?_15  - 0 ?Routine Hospital Admission (non-emergent condition) ?_16  - 0 ?New Admissions / Biomedical engineer / Ordering NPWT, Apligraf, etc. ?_17  - 0 ?Emergency Hospital Admission (emergent condition) ?X- 1 10 ?Simple Discharge Coordination ?_18  - 0 ?Complex (extensive) Discharge Coordination ?PROCESS - Special Needs ?_19  - Pediatric / Minor Patient Management 0 ?_20  - 0 ?Isolation Patient Management ?_21  - 0 ?Hearing / Language / Visual special needs ?_22  - 0 ?Assessment of Community assistance (transportation, D/C planning, etc.) ?_23  - 0 ?Additional assistance / Altered mentation ?_24  - 0 ?Support Surface(s) Assessment (bed, cushion, seat, etc.) ?INTERVENTIONS - Wound Cleansing / Measurement ?ALIEU, FINNIGAN ([id number]) ?X- 1  5 ?Simple Wound Cleansing - one wound ?X- 2 5 ?Complex Wound Cleansing - multiple wounds ?X- 1 5 ?Wound Imaging (photographs - any number of wounds) ?_25  - 0 ?Wound  Tracing (instead of photographs) ?_0  - 0 ?Simple Wound Measurement - one wound ?X- 2 5 ?Complex Wound Measurement - multiple wounds ?INTERVENTIONS - Wound Dressings ?X - Small Wound Dressing one or multiple wounds 2 10 ?_1  - 0 ?Medium Wound Dressing one or multiple wounds ?_2  - 0 ?Large Wound Dressing one or multiple wounds ?X- 1 5 ?Application of Medications - topical ?_3  - 0 ?Application of Medications - injection ?INTERVENTIONS - Miscellaneous ?_4  - External ear exam 0 ?_5  - 0 ?Specimen Collection (cultures, biopsies, blood, body fluids, etc.) ?_6  - 0 ?Specimen(s) / Culture(s) sent or taken to Lab for analysis ?_7  - 0 ?Patient Transfer (multiple staff / Civil Service fast streamer / Similar devices) ?_8  - 0 ?Simple Staple / Suture removal (25 or less) ?_9  - 0 ?Complex Staple / Suture removal (26 or more) ?_10  - 0 ?Hypo / Hyperglycemic Management (close monitor of Blood Glucose) ?_11  - 0 ?Ankle / Brachial Index (ABI) - do not check if billed separately ?X- 1 5 ?Vital Signs ?Has the patient been seen at the hospital within the last three years: Yes ?Total Score: 95 ?Level Of Care: New/Established - Level ?3 ?Electronic Signature(s) ?Signed: 06/11/2021 4:17:36 PM By: Donnamarie Poag ?Entered ByDonnamarie Poag on 06/11/2021 09:06:39 ?TANNEN, VANDEZANDE F. ([id number]) ?-------------------------------------------------------------------------------- ?Lower Extremity Assessment Details ?Patient Name: LELYND, POER ?Date of Service: 06/11/2021 8:30 AM ?Medical Record Number: [id number] ?Patient Account Number: [id number] ?Date of Birth/Sex: [birth year] (61 y.o. M) ?Treating RN: Donnamarie Poag ?Primary Care Suresh Audi: [name] Other Clinician: ?Referring Naim Murtha: [name] ?Treating Sheralyn Pinegar/Extender: Jeri Cos ?Weeks in Treatment: 8 ?Edema Assessment ?Assessed: [Left: No] [Right:  Yes] ?[Left: Edema] [Right: :] ?Calf ?Left: Right: ?Point of Measurement: 30 cm From Medial Instep 39 cm ?Ankle ?Left: Right: ?Point of Measurement: 10 cm From Medial Instep 25 cm ?Knee To Floor ?Left: Right: ?From Medial Instep 40 cm ?Vascular Assessment ?Pulses: ?Dorsalis Pedis ?Palpable: [Right:Yes] ?Electronic Signature(s) ?Signed: 06/11/2021 4:17:36 PM By: Donnamarie Poag ?Entered ByDonnamarie Poag on 06/11/2021 08:50:18 ?SANI, MADARIAGA F. ([id number]) ?-------------------------------------------------------------------------------- ?Multi Wound Chart Details ?Patient Name: DAYNA, GEURTS ?Date of Service: 06/11/2021 8:30 AM ?Medical Record Number: [id number] ?Patient Account Number: [id number] ?Date of Birth/Sex: 07-08-1960 (61 y.o. M) ?Treating RN: Donnamarie Poag ?Primary Care Reeder Brisby: [name] Other Clinician: ?Referring Noriel Guthrie: [name] ?Treating Viana Sleep/Extender: Jeri Cos ?Weeks in Treatment: 8 ?Vital Signs ?Height(in): 70 ?Pulse(bpm): 80 ?Weight(lbs): 270 ?Blood Pressure(mmHg): 125/70 ?Body Mass Index(BMI): 38.7 ?Temperature(??F): 97.5 ?Respiratory Rate(breaths/min): 16 ?Photos: [N/A:N/A] ?Wound Location: Right, Medial, Anterior Lower Leg Right, Medial, Anterior Lower Leg N/A ?Wounding Event: Blister Gradually Appeared N/A ?Primary Etiology: Venous Leg Ulcer Venous Leg Ulcer N/A ?Comorbid History: Cataracts, Lymphedema, Asthma, Cataracts, Lymphedema, Asthma, N/A ?Sleep Apnea, Arrhythmia, Sleep Apnea, Arrhythmia, ?Congestive Heart Failure, Coronary Congestive Heart Failure, Coronary ?Artery Disease, Hypertension, Type Artery Disease, Hypertension, Type ?II Diabetes, End Stage Renal II Diabetes, End Stage Renal ?Disease, Gout, Osteoarthritis, Disease, Gout, Osteoarthritis, ?Neuropathy Neuropathy ?Date Acquired: 03/08/2021 05/04/2021 N/A ?Weeks of Treatment: 8 5 N/A ?Wound Status: Open Open N/A ?Wound Recurrence: No No N/A ?Measurements L x W x D (cm) 0.4x0.5x0.1 0.3x0.2x0.1 N/A ?Area (cm?) : 0.157  0.047 N/A ?Volume (cm?) : 0.016 0.005 N/A ?% Reduction in Area: 93.00% 98.70% N/A ?% Reduction in Volume: 92.90% 98.70% N/A ?Classification: Full Thickness Without Exposed Partial Thickness N/A ?Support Struct

## 2021-06-11 NOTE — Progress Notes (Addendum)
WINSTON, MISNER ([id number]) ?Visit Report for 06/11/2021 ?Chief Complaint Document Details ?Patient Name: OFFIE, WAIDE ?Date of Service: 06/11/2021 8:30 AM ?Medical Record Number: [id number] ?Patient Account Number: [id number] ?Date of Birth/Sex: June 19, 1960 (61 y.o. M) ?Treating RN: Donnamarie Poag ?Primary Care Provider: [name] Other Clinician: ?Referring Provider: [name] ?Treating Provider/Extender: Jeri Cos ?Weeks in Treatment: 8 ?Information Obtained from: Patient ?Chief Complaint ?Bilateral LE Ulcers ?Electronic Signature(s) ?Signed: 06/11/2021 9:01:00 AM By: Worthy Keeler PA-C ?Entered By: Worthy Keeler on 06/11/2021 09:01:00 ?BEDFORD, WINSOR ([id number]) ?-------------------------------------------------------------------------------- ?HPI Details ?Patient Name: OSAMA, COLESON ?Date of Service: 06/11/2021 8:30 AM ?Medical Record Number: [id number] ?Patient Account Number: [id number] ?Date of Birth/Sex: 03-20-1961 (61 y.o. M) ?Treating RN: Donnamarie Poag ?Primary Care Provider: [name] Other Clinician: ?Referring Provider: [name] ?Treating Provider/Extender: Jeri Cos ?Weeks in Treatment: 8 ?History of Present Illness ?Location: left anterior shin and left medial calf ?Quality: Patient reports experiencing a dull pain to affected area(s). ?Severity: Patient states wound are getting worse. ?Duration: Patient has had the wound for > 3 months prior to seeking treatment at the wound center ?Timing: Pain in wound is Intermittent (comes and goes ?Context: The wound appeared gradually over time ?Modifying Factors: Other treatment(s) tried include:had surgery on the left anterior shin for a hematoma which has been nonhealing for the last ?8 months ?Associated Signs and Symptoms: Patient reports having increase swelling. ?HPI Description: 61 year old diabetic was last hemoglobin A1c was 7.2% has been reviewed today with 2 ulcerated areas in his left lower ?extremity which she's had for  about 8 months and the one most recently was started as a blister for 2 weeks. he has a past medical history of ?atrial fibrillation, coronary artery disease, CHF, chronic venous stasis dermatitis, diabetes mellitus, hypertension, sleep apnea and is status post ?kidney transplant in 2012. He is a former smoker and quit cigars in 1996. ?In the remote past he's had some arterial and venous duplex studies done but does not recall the results no was ever treated for venous ?insufficiency. ?06/10/2016 -- -- had a office visit at [facility] seen by the [place] assistant [facility]. Patient underwent an ?ABI which showed medial calcification, normal tibial artery Doppler waveform, PPG waveforms and toe brachial indices suggest normal arterial ?perfusion to bilateral lower extremities. right TBI was 0.97 and left was 0.84 ?Bilateral venous duplex was notable for venous reflux in the left greater saphenous vein and popliteal veins.Marland Kitchen ?A left lower extremity endovenous laser ablation was recommended. ?06/27/2016 -- I understand he spoke to his insurance company and there is a 3 month waiting period with conservative therapy prior to ?authorizing surgical intervention. ?07/12/16- he is here for follow-up evaluation of a left lower extremity ulcer. He did bring his compression stockings today. He is voicing no ?complaints or concerns ?Readmission: ?04/16/2021 upon evaluation today patient appears for reevaluation here in the clinic he was last seen in April 2018. At that time he was having ?issues with venous stasis ulcers and that again is what he is dealing with today as well. Fortunately I do not see any signs of active infection at ?this time which is great news and overall very pleased in that regard. Nonetheless he does have some areas of eschar which is going require ?debridement to clear away some of the necrotic debris so that we can hopefully get this healed as quickly as possible. That was  discussed with the ?patient today as well. ?The patient does have diabetes mellitus  type 2 for which she does have a hemoglobin A1c of 7.27 November 2020. He also has been on Keflex for ?what was felt to be cellulitis that seems to be doing better these wounds began on March 08, 2021. The patient also has a history of ?lymphedema, chronic venous insufficiency, and is a kidney transplant patient. ?04/24/2021 upon evaluation today patient appears to be doing okay in regard to his wounds. Fortunately there does not appear to be any evidence ?of active infection locally nor systemically at this time which is good news. Nonetheless he was not able to keep the compression wraps on I think ?this is going be a little bit more problem he tells me his anxiety was to the point he just was not able to do it. I do understand but at the same ?time that is good to make things somewhat difficult to be honest. ?04/30/2021 upon evaluation today patient appears to be doing well with regard to his wounds on his legs. Fortunately I do not see any signs of ?active infection locally or systemically at this time which is great news. No fevers, chills, nausea, vomiting, or diarrhea. ?05/07/2021 upon evaluation patient appears to be doing well with regard to his wounds. He does have a new area on the right leg due to a skin ?tear as he was pulling up his compression socks. This obviously is a little frustrating for him everything else seems to be doing much better his ?blood flow was good I did print off the test today shows that he has excellent TBI's which is also news. ?05/14/2021 upon evaluation today patient appears to be doing better in fact in regard to his left leg he is completely healed in regard to the right ?leg he still continues to have some open areas in 2 spots but both are doing better in my opinion which is great news. Fortunately I do not see ?any signs of active infection locally or systemically at this time which is great  news as well. ?05/21/2021 upon evaluation today patient appears to be doing well with regard to his wounds. I feel like he is actually showing some signs of ?improvement which is great news. Fortunately I do not see any evidence of active infection locally nor systemically at this point which is great ?news. No fevers, chills, nausea, vomiting, or diarrhea. ?05/28/2021 upon evaluation today patient to be doing awesome in regard to his wound. He has been tolerating the dressing changes without ?complication. Fortunately there does not appear to be any signs of active infection and both wounds are actually showing signs of excellent ?improvement which is great news. No fevers, chills, nausea, vomiting, or diarrhea. ?3/13; patient presents for follow-up. He has no issues or complaints today. Denies signs of infection. He has been using collagen for dressing ?changes and using his compression stocking daily. VALDIS, BEVILL ([id number]) ?06/11/2021 upon evaluation patient appears to be doing very well in regard to the wounds on his legs. He has been tolerating the dressing changes ?without complication and overall I do feel like that he is very close to complete resolution. I do not see any signs of active infection locally nor ?systemically which is great news. ?Electronic Signature(s) ?Signed: 06/11/2021 9:25:29 AM By: Worthy Keeler PA-C ?Entered By: Worthy Keeler on 06/11/2021 09:25:29 ?ATLEE, KLUTH F. ([id number]) ?-------------------------------------------------------------------------------- ?Physical Exam Details ?Patient Name: TILMON, WISEHART ?Date of Service: 06/11/2021 8:30 AM ?Medical Record Number: [id number] ?Patient Account Number: [id number] ?Date of Birth/Sex: 11-15-1960 (  61 y.o. M) ?Treating RN: Donnamarie Poag ?Primary Care Provider: [name] Other Clinician: ?Referring Provider: [name] ?Treating Provider/Extender: Jeri Cos ?Weeks in Treatment: 8 ?Constitutional ?Well-nourished and  well-hydrated in no acute distress. ?Respiratory ?normal breathing without difficulty. ?Psychiatric ?this patient is able to make decisions and demonstrates good insight into disease process. Alert and Orient

## 2021-06-12 DIAGNOSIS — S81809A Unspecified open wound, unspecified lower leg, initial encounter: Secondary | ICD-10-CM | POA: Diagnosis not present

## 2021-06-18 ENCOUNTER — Encounter: Payer: 59 | Admitting: Physician Assistant

## 2021-06-18 ENCOUNTER — Other Ambulatory Visit: Payer: Self-pay

## 2021-06-18 DIAGNOSIS — E1140 Type 2 diabetes mellitus with diabetic neuropathy, unspecified: Secondary | ICD-10-CM | POA: Diagnosis not present

## 2021-06-18 DIAGNOSIS — N186 End stage renal disease: Secondary | ICD-10-CM | POA: Diagnosis not present

## 2021-06-18 DIAGNOSIS — G4730 Sleep apnea, unspecified: Secondary | ICD-10-CM | POA: Diagnosis not present

## 2021-06-18 DIAGNOSIS — I132 Hypertensive heart and chronic kidney disease with heart failure and with stage 5 chronic kidney disease, or end stage renal disease: Secondary | ICD-10-CM | POA: Diagnosis not present

## 2021-06-18 DIAGNOSIS — E11622 Type 2 diabetes mellitus with other skin ulcer: Secondary | ICD-10-CM | POA: Diagnosis not present

## 2021-06-18 DIAGNOSIS — I890 Lymphedema, not elsewhere classified: Secondary | ICD-10-CM | POA: Diagnosis not present

## 2021-06-18 DIAGNOSIS — L97822 Non-pressure chronic ulcer of other part of left lower leg with fat layer exposed: Secondary | ICD-10-CM | POA: Diagnosis not present

## 2021-06-18 DIAGNOSIS — R0683 Snoring: Secondary | ICD-10-CM | POA: Diagnosis not present

## 2021-06-18 DIAGNOSIS — I87311 Chronic venous hypertension (idiopathic) with ulcer of right lower extremity: Secondary | ICD-10-CM | POA: Diagnosis not present

## 2021-06-18 DIAGNOSIS — E1122 Type 2 diabetes mellitus with diabetic chronic kidney disease: Secondary | ICD-10-CM | POA: Diagnosis not present

## 2021-06-18 DIAGNOSIS — L97812 Non-pressure chronic ulcer of other part of right lower leg with fat layer exposed: Secondary | ICD-10-CM | POA: Diagnosis not present

## 2021-06-18 NOTE — Progress Notes (Addendum)
FOYE, DAMRON ([id number]) ?Visit Report for 06/18/2021 ?Chief Complaint Document Details ?Patient Name: [name], [name] ?Date of Service: 06/18/2021 8:30 AM ?Medical Record Number: [id number] ?Patient Account Number: [id number] ?Date of Birth/Sex: [birth year] (61 y.o. M) ?Treating RN: Donnamarie Poag ?Primary Care Provider: [name] Other Clinician: ?Referring Provider: [name] ?Treating Provider/Extender: Jeri Cos ?Weeks in Treatment: 9 ?Information Obtained from: Patient ?Chief Complaint ?Bilateral LE Ulcers ?Electronic Signature(s) ?Signed: 06/18/2021 9:35:24 AM By: Worthy Keeler PA-C ?Entered By: Worthy Keeler on 06/18/2021 09:35:24 ?[name] ([id number]) ?-------------------------------------------------------------------------------- ?HPI Details ?Patient Name: [name] ?Date of Service: 06/18/2021 8:30 AM ?Medical Record Number: [id number] ?Patient Account Number: [id number] ?Date of Birth/Sex: 07/12/1960 (61 y.o. M) ?Treating RN: Donnamarie Poag ?Primary Care Provider: [name] Other Clinician: ?Referring Provider: [name] ?Treating Provider/Extender: Jeri Cos ?Weeks in Treatment: 9 ?History of Present Illness ?Location: left anterior shin and left medial calf ?Quality: Patient reports experiencing a dull pain to affected area(s). ?Severity: Patient states wound are getting worse. ?Duration: Patient has had the wound for > 3 months prior to seeking treatment at the wound center ?Timing: Pain in wound is Intermittent (comes and goes ?Context: The wound appeared gradually over time ?Modifying Factors: Other treatment(s) tried include:had surgery on the left anterior shin for a hematoma which has been nonhealing for the last ?8 months ?Associated Signs and Symptoms: Patient reports having increase swelling. ?HPI Description: 61 year old diabetic was last hemoglobin A1c was 7.2% has been reviewed today with 2 ulcerated areas in his left lower ?extremity which she's had for  about 8 months and the one most recently was started as a blister for 2 weeks. he has a past medical history of ?atrial fibrillation, coronary artery disease, CHF, chronic venous stasis dermatitis, diabetes mellitus, hypertension, sleep apnea and is status post ?kidney transplant in 2012. He is a former smoker and quit cigars in 1996. ?In the remote past he's had some arterial and venous duplex studies done but does not recall the results no was ever treated for venous ?insufficiency. ?06/10/2016 -- -- had a office visit at [facility] seen by the [place] assistant [facility]. Patient underwent an ?ABI which showed medial calcification, normal tibial artery Doppler waveform, PPG waveforms and toe brachial indices suggest normal arterial ?perfusion to bilateral lower extremities. right TBI was 0.97 and left was 0.84 ?Bilateral venous duplex was notable for venous reflux in the left greater saphenous vein and popliteal veins.Marland Kitchen ?A left lower extremity endovenous laser ablation was recommended. ?06/27/2016 -- I understand he spoke to his insurance company and there is a 3 month waiting period with conservative therapy prior to ?authorizing surgical intervention. ?07/12/16- he is here for follow-up evaluation of a left lower extremity ulcer. He did bring his compression stockings today. He is voicing no ?complaints or concerns ?Readmission: ?04/16/2021 upon evaluation today patient appears for reevaluation here in the clinic he was last seen in April 2018. At that time he was having ?issues with venous stasis ulcers and that again is what he is dealing with today as well. Fortunately I do not see any signs of active infection at ?this time which is great news and overall very pleased in that regard. Nonetheless he does have some areas of eschar which is going require ?debridement to clear away some of the necrotic debris so that we can hopefully get this healed as quickly as possible. That was  discussed with the ?patient today as well. ?The patient does have diabetes mellitus  type 2 for which she does have a hemoglobin A1c of 7.27 November 2020. He also has been on Keflex for ?what was felt to be cellulitis that seems to be doing better these wounds began on March 08, 2021. The patient also has a history of ?lymphedema, chronic venous insufficiency, and is a kidney transplant patient. ?04/24/2021 upon evaluation today patient appears to be doing okay in regard to his wounds. Fortunately there does not appear to be any evidence ?of active infection locally nor systemically at this time which is good news. Nonetheless he was not able to keep the compression wraps on I think ?this is going be a little bit more problem he tells me his anxiety was to the point he just was not able to do it. I do understand but at the same ?time that is good to make things somewhat difficult to be honest. ?04/30/2021 upon evaluation today patient appears to be doing well with regard to his wounds on his legs. Fortunately I do not see any signs of ?active infection locally or systemically at this time which is great news. No fevers, chills, nausea, vomiting, or diarrhea. ?05/07/2021 upon evaluation patient appears to be doing well with regard to his wounds. He does have a new area on the right leg due to a skin ?tear as he was pulling up his compression socks. This obviously is a little frustrating for him everything else seems to be doing much better his ?blood flow was good I did print off the test today shows that he has excellent TBI's which is also news. ?05/14/2021 upon evaluation today patient appears to be doing better in fact in regard to his left leg he is completely healed in regard to the right ?leg he still continues to have some open areas in 2 spots but both are doing better in my opinion which is great news. Fortunately I do not see ?any signs of active infection locally or systemically at this time which is great  news as well. ?05/21/2021 upon evaluation today patient appears to be doing well with regard to his wounds. I feel like he is actually showing some signs of ?improvement which is great news. Fortunately I do not see any evidence of active infection locally nor systemically at this point which is great ?news. No fevers, chills, nausea, vomiting, or diarrhea. ?05/28/2021 upon evaluation today patient to be doing awesome in regard to his wound. He has been tolerating the dressing changes without ?complication. Fortunately there does not appear to be any signs of active infection and both wounds are actually showing signs of excellent ?improvement which is great news. No fevers, chills, nausea, vomiting, or diarrhea. ?3/13; patient presents for follow-up. He has no issues or complaints today. Denies signs of infection. He has been using collagen for dressing ?changes and using his compression stocking daily. MARKELLE, NAJARIAN ([id number]) ?06/11/2021 upon evaluation patient appears to be doing very well in regard to the wounds on his legs. He has been tolerating the dressing changes ?without complication and overall I do feel like that he is very close to complete resolution. I do not see any signs of active infection locally nor ?systemically which is great news. ?06/18/2021 upon evaluation today patient appears to be doing well currently in regard to his wounds. In fact the anterior wound is actually closed ?the wound on the right medial region is actually very close to being completely closed but still has a small opening today. I think he is  at the point ?where hopefully by next week this will be resolved. Nonetheless I do feel like that he is still having quite a bit of an issue here all things ?considered. ?Electronic Signature(s) ?Signed: 06/18/2021 10:27:18 AM By: Worthy Keeler PA-C ?Entered By: Worthy Keeler on 06/18/2021 10:27:17 ?FURKAN, KEENUM F.  ([id number]) ?-------------------------------------------------------------------------------- ?Physical Exam Details ?Patient Name: STCLAIR, SZYMBORSKI ?Date of Service: 06/18/2021 8:30 AM ?Medical Record Number: [id number] ?Patient Account Number: [id number] ?Date of [place]

## 2021-06-18 NOTE — Progress Notes (Addendum)
BRIAR, WITHERSPOON ([id number]) ?Visit Report for 06/18/2021 ?Arrival Information Details ?Patient Name: [name], [name] ?Date of Service: 06/18/2021 8:30 AM ?Medical Record Number: [id number] ?Patient Account Number: [id number] ?Date of Birth/Sex: 1960/12/13 (61 y.o. M) ?Treating RN: Donnamarie Poag ?Primary Care Teal Raben: [name] Other Clinician: ?Referring Aaleah Hirsch: [name] ?Treating Koula Venier/Extender: Jeri Cos ?Weeks in Treatment: 9 ?Visit Information History Since Last Visit ?Added or deleted any medications: No ?Patient Arrived: Ambulatory ?Had a fall or experienced change in No ?Arrival Time: 08:40 ?activities of daily living that may affect ?Accompanied By: self ?risk of falls: ?Transfer Assistance: None ?Hospitalized since last visit: No ?Patient Identification Verified: Yes ?Has Dressing in Place as Prescribed: Yes ?Secondary Verification Process Completed: Yes ?Pain Present Now: No ?Patient Requires Transmission-Based No ?Precautions: ?Patient Has Alerts: Yes ?Patient Alerts: Patient on Blood ?Thinner ?DIABETIC ?[place] left leg 04/16/21 ?ELIQUIS ?[facility] 05/02/21 [place] ABI ?TBI L 0.90; R 1.03 ?Electronic Signature(s) ?Signed: 06/18/2021 3:12:36 PM By: Donnamarie Poag ?Entered ByDonnamarie Poag on 06/18/2021 08:51:52 ?[name], [name] F. ([id number]) ?-------------------------------------------------------------------------------- ?Clinic Level of Care Assessment Details ?Patient Name: [name], [name] ?Date of Service: 06/18/2021 8:30 AM ?Medical Record Number: [id number] ?Patient Account Number: [id number] ?Date of Birth/Sex: [birth year] (61 y.o. M) ?Treating RN: Donnamarie Poag ?Primary Care Kaleiyah Polsky: [name] Other Clinician: ?Referring Akshay Spang: [name] ?Treating Josep Luviano/Extender: Jeri Cos ?Weeks in Treatment: 9 ?Clinic Level of Care Assessment Items ?TOOL 4 Quantity Score ?_0  - Use when only an EandM is performed on FOLLOW-UP visit 0 ?ASSESSMENTS - Nursing Assessment / Reassessment ?_1  - Reassessment of  Co-morbidities (includes updates in patient status) 0 ?_2  - 0 ?Reassessment of Adherence to Treatment Plan ?ASSESSMENTS - Wound and Skin Assessment / Reassessment ?X - Simple Wound Assessment / Reassessment - one wound 1 5 ?_3  - 0 ?Complex Wound Assessment / Reassessment - multiple wounds ?_4  - 0 ?Dermatologic / Skin Assessment (not related to wound area) ?ASSESSMENTS - Focused Assessment ?_5  - Circumferential Edema Measurements - multi extremities 0 ?_6  - 0 ?Nutritional Assessment / Counseling / Intervention ?_7  - 0 ?Lower Extremity Assessment (monofilament, tuning fork, pulses) ?_8  - 0 ?Peripheral Arterial Disease Assessment (using hand held doppler) ?ASSESSMENTS - Ostomy and/or Continence Assessment and Care ?_9  - Incontinence Assessment and Management 0 ?_10  - 0 ?Ostomy Care Assessment and Management (repouching, etc.) ?PROCESS - Coordination of Care ?X - Simple Patient / Family Education for ongoing care 1 15 ?_11  - 0 ?Complex (extensive) Patient / Family Education for ongoing care ?X- 1 10 ?Staff obtains Consents, Records, Test Results / Process Orders ?_12  - 0 ?Staff telephones [place], Nursing Homes / Clarify orders / etc ?_13  - 0 ?Routine Transfer to another Facility (non-emergent condition) ?_14  - 0 ?Routine Hospital Admission (non-emergent condition) ?_15  - 0 ?New Admissions / Biomedical engineer / Ordering NPWT, Apligraf, etc. ?_16  - 0 ?Emergency Hospital Admission (emergent condition) ?X- 1 10 ?Simple Discharge Coordination ?_17  - 0 ?Complex (extensive) Discharge Coordination ?PROCESS - Special Needs ?_18  - Pediatric / Minor Patient Management 0 ?_19  - 0 ?Isolation Patient Management ?_20  - 0 ?Hearing / Language / Visual special needs ?_21  - 0 ?Assessment of Community assistance (transportation, D/C planning, etc.) ?_22  - 0 ?Additional assistance / Altered mentation ?_23  - 0 ?Support Surface(s) Assessment (bed, cushion, seat, etc.) ?INTERVENTIONS - Wound Cleansing / Measurement ?[name], [name] ([id number]) ?_24  -  0 ?Simple Wound Cleansing - one wound ?X- 2 5 ?Complex Wound Cleansing - multiple wounds ?_25  - 0 ?Wound Imaging (photographs - any number of wounds) ?X- 1 5 ?  Wound Tracing (instead of photographs) ?_0  - 0 ?Simple Wound Measurement - one wound ?X- 2 5 ?Complex Wound Measurement - multiple wounds ?INTERVENTIONS - Wound Dressings ?X - Small Wound Dressing one or multiple wounds 1 10 ?_1  - 0 ?Medium Wound Dressing one or multiple wounds ?_2  - 0 ?Large Wound Dressing one or multiple wounds ?X- 1 5 ?Application of Medications - topical ?_3  - 0 ?Application of Medications - injection ?INTERVENTIONS - Miscellaneous ?_4  - External ear exam 0 ?_5  - 0 ?Specimen Collection (cultures, biopsies, blood, body fluids, etc.) ?_6  - 0 ?Specimen(s) / Culture(s) sent or taken to Lab for analysis ?_7  - 0 ?Patient Transfer (multiple staff / Civil Service fast streamer / Similar devices) ?_8  - 0 ?Simple Staple / Suture removal (25 or less) ?_9  - 0 ?Complex Staple / Suture removal (26 or more) ?_10  - 0 ?Hypo / Hyperglycemic Management (close monitor of Blood Glucose) ?_11  - 0 ?Ankle / Brachial Index (ABI) - do not check if billed separately ?X- 1 5 ?Vital Signs ?Has the patient been seen at the hospital within the last three years: Yes ?Total Score: 85 ?Level Of Care: New/Established - Level ?3 ?Electronic Signature(s) ?Signed: 06/18/2021 3:12:36 PM By: Donnamarie Poag ?Entered ByDonnamarie Poag on 06/18/2021 09:01:30 ?[name], [name] F. ([id number]) ?-------------------------------------------------------------------------------- ?Encounter Discharge Information Details ?Patient Name: [name], [name] ?Date of Service: 06/18/2021 8:30 AM ?Medical Record Number: [id number] ?Patient Account Number: [id number] ?Date of Birth/Sex: [birth year] (61 y.o. M) ?Treating RN: Donnamarie Poag ?Primary Care Edouard Gikas: [name] Other Clinician: ?Referring Lesly Pontarelli: [name] ?Treating Skylin Kennerson/Extender: Jeri Cos ?Weeks in Treatment: 9 ?Encounter Discharge Information  Items ?Discharge Condition: Stable ?Ambulatory Status: Ambulatory ?Discharge Destination: Home ?Transportation: Private Auto ?Accompanied By: self ?Schedule Follow-up Appointment: Yes ?Clinical Summary of Care: ?Electronic Signature(s) ?Signed: 06/18/2021 3:12:36 PM By: Donnamarie Poag ?Entered ByDonnamarie Poag on 06/18/2021 09:06:03 ?[name], [name] F. ([id number]) ?-------------------------------------------------------------------------------- ?Lower Extremity Assessment Details ?Patient Name: [name], [name] ?Date of Service: 06/18/2021 8:30 AM ?Medical Record Number: [id number] ?Patient Account Number: [id number] ?Date of Birth/Sex: April 13, 1960 (61 y.o. M) ?Treating RN: Donnamarie Poag ?Primary Care Zhoey Blackstock: [name] Other Clinician: ?Referring Shrita Thien: [name] ?Treating Marney Treloar/Extender: Jeri Cos ?Weeks in Treatment: 9 ?Edema Assessment ?Assessed: [Left: Yes] [Right: Yes] ?[Left: Edema] [Right: :] ?Calf ?Left: Right: ?Point of Measurement: 30 cm From Medial Instep 39 cm ?Ankle ?Left: Right: ?Point of Measurement: 10 cm From Medial Instep 25 cm ?Vascular Assessment ?Pulses: ?Dorsalis Pedis ?Palpable: [Right:Yes] ?Electronic Signature(s) ?Signed: 06/18/2021 3:12:36 PM By: Donnamarie Poag ?Entered ByDonnamarie Poag on 06/18/2021 08:56:19 ?[name], [name] F. ([id number]) ?-------------------------------------------------------------------------------- ?Multi Wound Chart Details ?Patient Name: [name], [name] ?Date of Service: 06/18/2021 8:30 AM ?Medical Record Number: [id number] ?Patient Account Number: [id number] ?Date of Birth/Sex: 29-Sep-1960 (61 y.o. M) ?Treating RN: Donnamarie Poag ?Primary Care Tinnie Kunin: [name] Other Clinician: ?Referring Dorthea Maina: [name] ?Treating Dayln Tugwell/Extender: Jeri Cos ?Weeks in Treatment: 9 ?Vital Signs ?Height(in): 70 ?Pulse(bpm): 78 ?Weight(lbs): 270 ?Blood Pressure(mmHg): 125/65 ?Body Mass Index(BMI): 38.7 ?Temperature(??F): 97.7 ?Respiratory Rate(breaths/min): 16 ?Photos:  [N/A:N/A] ?Wound Location: Right, Medial, Anterior Lower Leg Right, Medial, Anterior Lower Leg N/A ?Wounding Event: Blister Gradually Appeared N/A ?Primary Etiology: Venous Leg Ulcer Venous Leg Ulcer N/A ?Comorbid

## 2021-06-21 ENCOUNTER — Telehealth (HOSPITAL_COMMUNITY): Payer: Self-pay

## 2021-06-21 NOTE — Telephone Encounter (Signed)
Had a home visit scheduled, he states he had to reschedule this.  He states been doing good.  Will reschedule a home visit with him for a later date.  ? ?Marchelle Rinella ?[place] EMT-Paramedic ?[phone] ?

## 2021-06-22 ENCOUNTER — Other Ambulatory Visit: Payer: Self-pay | Admitting: Internal Medicine

## 2021-06-22 ENCOUNTER — Other Ambulatory Visit: Payer: Self-pay

## 2021-06-22 ENCOUNTER — Other Ambulatory Visit (HOSPITAL_COMMUNITY): Payer: Self-pay | Admitting: Cardiology

## 2021-06-22 MED FILL — Dapagliflozin Propanediol Tab 10 MG (Base Equivalent): ORAL | 30 days supply | Qty: 30 | Fill #0 | Status: AC

## 2021-06-22 MED FILL — Polysaccharide Iron Complex Cap 150 MG (Iron Equivalent): ORAL | 90 days supply | Qty: 180 | Fill #0 | Status: AC

## 2021-06-25 ENCOUNTER — Other Ambulatory Visit: Payer: Self-pay

## 2021-06-25 ENCOUNTER — Encounter: Payer: 59 | Admitting: Physician Assistant

## 2021-06-25 ENCOUNTER — Other Ambulatory Visit (HOSPITAL_COMMUNITY): Payer: Self-pay | Admitting: Cardiology

## 2021-06-25 ENCOUNTER — Other Ambulatory Visit: Payer: Self-pay | Admitting: Medical

## 2021-06-25 ENCOUNTER — Other Ambulatory Visit (HOSPITAL_COMMUNITY): Payer: Self-pay | Admitting: Family Medicine

## 2021-06-25 DIAGNOSIS — G4733 Obstructive sleep apnea (adult) (pediatric): Secondary | ICD-10-CM | POA: Diagnosis not present

## 2021-06-25 DIAGNOSIS — E662 Morbid (severe) obesity with alveolar hypoventilation: Secondary | ICD-10-CM | POA: Diagnosis not present

## 2021-06-25 MED ORDER — TORSEMIDE 100 MG PO TABS
100.0000 mg | ORAL_TABLET | Freq: Two times a day (BID) | ORAL | 0 refills | Status: DC
Start: 2021-06-25 — End: 2021-08-01
  Filled 2021-06-25: qty 60, 30d supply, fill #0

## 2021-06-25 MED FILL — Rosuvastatin Calcium Tab 10 MG: ORAL | 30 days supply | Qty: 30 | Fill #0 | Status: AC

## 2021-06-26 ENCOUNTER — Other Ambulatory Visit: Payer: Self-pay

## 2021-06-28 ENCOUNTER — Encounter: Payer: Self-pay | Admitting: Family Medicine

## 2021-06-28 ENCOUNTER — Other Ambulatory Visit: Payer: Self-pay

## 2021-06-28 ENCOUNTER — Ambulatory Visit (INDEPENDENT_AMBULATORY_CARE_PROVIDER_SITE_OTHER): Payer: 59 | Admitting: Family Medicine

## 2021-06-28 ENCOUNTER — Ambulatory Visit
Admission: RE | Admit: 2021-06-28 | Discharge: 2021-06-28 | Disposition: A | Discharge status: Home / Self Care | Payer: 59 | Source: Ambulatory Visit | Attending: Family Medicine | Admitting: Family Medicine

## 2021-06-28 VITALS — BP 150/80 | HR 62 | Temp 97.8°F | Ht 70.5 in | Wt 273.4 lb

## 2021-06-28 DIAGNOSIS — D849 Immunodeficiency, unspecified: Secondary | ICD-10-CM

## 2021-06-28 DIAGNOSIS — M7989 Other specified soft tissue disorders: Secondary | ICD-10-CM

## 2021-06-28 DIAGNOSIS — Z940 Kidney transplant status: Secondary | ICD-10-CM

## 2021-06-28 DIAGNOSIS — M79604 Pain in right leg: Secondary | ICD-10-CM | POA: Diagnosis not present

## 2021-06-28 DIAGNOSIS — R238 Other skin changes: Secondary | ICD-10-CM

## 2021-06-28 DIAGNOSIS — E1165 Type 2 diabetes mellitus with hyperglycemia: Secondary | ICD-10-CM | POA: Diagnosis not present

## 2021-06-28 DIAGNOSIS — I2510 Atherosclerotic heart disease of native coronary artery without angina pectoris: Secondary | ICD-10-CM

## 2021-06-28 DIAGNOSIS — R600 Localized edema: Secondary | ICD-10-CM | POA: Diagnosis not present

## 2021-06-28 MED ORDER — CEPHALEXIN 500 MG PO CAPS
500.0000 mg | ORAL_CAPSULE | Freq: Three times a day (TID) | ORAL | 0 refills | Status: DC
  Filled 2021-06-28: qty 30, 10d supply, fill #0

## 2021-06-28 NOTE — Addendum Note (Signed)
Addended by: Owens Loffler on: 06/28/2021 02:08 PM ? ? Modules accepted: Orders ? ?

## 2021-06-28 NOTE — Patient Instructions (Signed)
GO RIGHT NOW to Radiology at [facility].  Inside the main hospital medical mall. ?

## 2021-06-28 NOTE — Progress Notes (Addendum)
? ? ?Carl Shuck T. Antwion Carpenter, MD, [facility] ?Therapist, music at [facility] ?[place] ?[place], [address] ? ?Phone: [phone]  FAX: [phone] ? ?[name]. - 61 y.o. male  MRN [id number]  Date of Birth: 08/31/1960 ? ?Date: 06/28/2021  PCP: [name], MD  Referral: [name], MD ? ?Chief Complaint  ?Patient presents with  ? Knee Pain  ?  Right  ? Leg Pain  ?  Right-Started on Monday  ? ? ?This visit occurred during the SARS-CoV-2 public health emergency.  Safety protocols were in place, including screening questions prior to the visit, additional usage of staff PPE, and extensive cleaning of exam room while observing appropriate contact time as indicated for disinfecting solutions.  ? ?Subjective:  ? ?[name]. is a 61 y.o. very pleasant male patient with Body mass index is 38.67 kg/m?. who presents with the following: ? ?R knee pain: h/o of renal transplant on multiple immunosuppresants. ? ?He has been having some right-sided lower leg pain, swelling.  He is now having some purplish and reddish coloration on the medial aspect.  He is also had multiple interventions with some vascular surgery on the right side as well. ? ?Not had any kind of trauma, accident, he is not hit his leg.  In addition to his immunosuppressants, he also has coronary disease, multiple other medical problems, and he is on Eliquis chronically. ? ? ? ?Review of Systems is noted in the HPI, as appropriate ? ?Objective:  ? ?BP (!) 150/80   Pulse 62   Temp 97.8 ?F (36.6 ?C) (Oral)   Ht 5' 10.5" (1.791 m)   Wt 273 lb 6 oz (124 kg)   SpO2 96%   BMI 38.67 kg/m?  ? ?GEN: No acute distress; alert,appropriate. ?PULM: Breathing comfortably in no respiratory distress ?PSYCH: Normally interactive.  ? ? ? ? ? ?This is mildly warm to touch. ? ?Right-sided lower extremity does have 2+ pitting edema compared to trace to 1+ on the left. ? ?Laboratory and Imaging  Data: ? ?Assessment and Plan:  ? ?  ICD-10-CM   ?1. Leg pain, right  M79.604 US Venous Img Lower Unilateral Right (DVT)  ?  ?2. Swollen leg  M79.89 US Venous Img Lower Unilateral Right (DVT)  ?  ?3. Redness and swelling of lower leg  M79.89   ? R23.8   ?  ?4. History of renal transplant  Z94.0   ?  ?5. Immunosuppressed status ([place])  D84.9   ?  ? ?.time ?Complicated medical patient with new onset right-sided lower extremity swelling, reddish and purplish coloration. ? ?Even though he is on Eliquis, I think that we need to first rule out an DVT.  If this is negative, then we will need to treat the patient for cellulitis.  Medications will need to be adjusted due to immunosuppressants and multiple other medications. ? ?Addendum: 06/28/21 2:03 PM  ? ?No DVT on [name].  Will treat for cellulitis. ?Keflex 500 mg po tid. ? ?US Venous Img Lower Unilateral Right (DVT) ? ?Result Date: 06/28/2021 ?CLINICAL DATA:  Edema and discoloration of right lower extremity. EXAM: RIGHT LOWER EXTREMITY VENOUS DOPPLER ULTRASOUND TECHNIQUE: Gray-scale sonography with graded compression, as well as color Doppler and duplex ultrasound were performed to evaluate the lower extremity deep venous systems from the level of the common femoral vein and including the common femoral, femoral, profunda femoral, popliteal and calf veins including the posterior tibial, peroneal and  gastrocnemius veins when visible. The superficial great saphenous vein was also interrogated. Spectral Doppler was utilized to evaluate flow at rest and with distal augmentation maneuvers in the common femoral, femoral and popliteal veins. COMPARISON:  None. FINDINGS: Contralateral Common Femoral Vein: Respiratory phasicity is normal and symmetric with the symptomatic side. No evidence of thrombus. Normal compressibility. Common Femoral Vein: No evidence of thrombus. Normal compressibility, respiratory phasicity and response to augmentation. Saphenofemoral Junction: No evidence of  thrombus. Normal compressibility and flow on color Doppler imaging. Profunda Femoral Vein: No evidence of thrombus. Normal compressibility and flow on color Doppler imaging. Femoral Vein: No evidence of thrombus. Normal compressibility, respiratory phasicity and response to augmentation. Popliteal Vein: No evidence of thrombus. Normal compressibility, respiratory phasicity and response to augmentation. Calf Veins: No evidence of thrombus. Normal compressibility and flow on color Doppler imaging. Superficial Great Saphenous Vein: No evidence of thrombus. Normal compressibility. Venous Reflux:  None. Other Findings: Area of irregular soft tissue edema and fluid along the medial aspect of the right knee. This is nonspecific but may relate to hemorrhage or infection. This is not a discretely marginated fluid collection. No evidence of superficial thrombophlebitis. IMPRESSION: 1. No evidence of right lower extremity deep vein thrombosis. 2. Irregular area of soft tissue edema and fluid along the medial aspect of the knee. Correlation suggested with clinical exam. This may relate to focal hemorrhage or infection. Electronically Signed   By: Aletta Edouard M.D.   On: 06/28/2021 11:26    ? ?No orders of the defined types were placed in this encounter. ? ?Medications Discontinued During This Encounter  ?Medication Reason  ? HUMALOG KWIKPEN 100 UNIT/ML KwikPen Completed Course  ? insulin lispro (HUMALOG) 100 UNIT/ML KwikPen Completed Course  ? insulin lispro (HUMALOG) 100 UNIT/ML KwikPen Completed Course  ? predniSONE (DELTASONE) 10 MG tablet Completed Course  ? sulfamethoxazole-trimethoprim (BACTRIM) 400-80 MG tablet Completed Course  ? tacrolimus (PROGRAF) 1 MG capsule Completed Course  ? ?Orders Placed This Encounter  ?Procedures  ? US Venous Img Lower Unilateral Right (DVT)  ? ? ?Follow-up: No follow-ups on file. ? ?Dragon Medical One speech-to-text software was used for transcription in this dictation.  Possible  transcriptional errors can occur using Editor, commissioning.  ? ?Signed, ? ?Jameela Michna T. Ebrima Ranta, MD ? ? ?Outpatient Encounter Medications as of 06/28/2021  ?Medication Sig  ? albuterol (PROVENTIL) (2.5 MG/3ML) 0.083% nebulizer solution Take 3 mLs (2.5 mg total) by nebulization every 6 (six) hours as needed for wheezing or shortness of breath.  ? albuterol (VENTOLIN HFA) 108 (90 Base) MCG/ACT inhaler Inhale 2 puffs into the lungs every 6 (six) hours as needed for wheezing or shortness of breath.  ? apixaban (ELIQUIS) 5 MG TABS tablet TAKE 1 TABLET BY MOUTH TWICE DAILY  ? beclomethasone (QVAR) 80 MCG/ACT inhaler Inhale 2 puffs into the lungs 2 (two) times daily.  ? buPROPion (WELLBUTRIN SR) 150 MG 12 hr tablet TAKE 1 TABLET BY MOUTH TWICE DAILY  ? cholecalciferol (VITAMIN D3) 25 MCG (1000 UNIT) tablet Take 1,000 Units by mouth daily.  ? colchicine 0.6 MG tablet Take 1 tablet (0.6 mg total) by mouth every other day  ? Continuous Blood Gluc Sensor (FREESTYLE LIBRE 3 SENSOR) MISC Use one every 2 weeks.  ? Cyanocobalamin (B-12 PO) Take by mouth.  ? dapagliflozin propanediol (FARXIGA) 10 MG TABS tablet Take 1 tablet (10 mg total) by mouth daily before breakfast. NEEDS FOLLOW UP APPOINTMENT FOR ANY MORE REFILLS  ? Febuxostat 80 MG TABS Take 1  tablet (80 mg total) by mouth once daily for 180 days  ? fluticasone (FLONASE) 50 MCG/ACT nasal spray PLACE 2 SPRAYS INTO BOTH NOSTRILS DAILY.  ? gabapentin (NEURONTIN) 300 MG capsule TAKE 1 CAPSULE BY MOUTH TWICE DAILY  ? insulin glargine-yfgn (SEMGLEE) 100 UNIT/ML Pen Inject 40 Units subcutaneously once daily  ? insulin lispro (HUMALOG) 100 UNIT/ML KwikPen Inject 50 Units into the skin daily as directed.  ? Insulin Pen Needle (UNIFINE PENTIPS) 31G X 5 MM MISC Use 4 times daily as directed  ? iron polysaccharides (FERREX 150) 150 MG capsule TAKE 1 CAPSULE BY MOUTH 2 TIMES DAILY.  ? losartan (COZAAR) 25 MG tablet Take 1 tablet (25 mg total) by mouth daily.  ? metolazone (ZAROXOLYN) 2.5 MG  tablet Take 1 tablet by mouth 2 days per week  ? montelukast (SINGULAIR) 10 MG tablet TAKE 1 TABLET BY MOUTH AT BEDTIME.  ? Multiple Vitamin (MULTIVITAMIN WITH MINERALS) TABS tablet Take 1 tablet by mouth daily.  ? mycophenolate (MYFORTIC)

## 2021-06-29 ENCOUNTER — Other Ambulatory Visit: Payer: Self-pay

## 2021-07-02 ENCOUNTER — Encounter: Payer: 59 | Admitting: Physician Assistant

## 2021-07-06 ENCOUNTER — Other Ambulatory Visit: Payer: Self-pay

## 2021-07-06 ENCOUNTER — Telehealth: Payer: Self-pay | Admitting: Internal Medicine

## 2021-07-06 DIAGNOSIS — Z4822 Encounter for aftercare following kidney transplant: Secondary | ICD-10-CM | POA: Diagnosis not present

## 2021-07-06 DIAGNOSIS — E669 Obesity, unspecified: Secondary | ICD-10-CM | POA: Diagnosis not present

## 2021-07-06 DIAGNOSIS — M1A9XX0 Chronic gout, unspecified, without tophus (tophi): Secondary | ICD-10-CM | POA: Diagnosis not present

## 2021-07-06 DIAGNOSIS — Z7901 Long term (current) use of anticoagulants: Secondary | ICD-10-CM | POA: Diagnosis not present

## 2021-07-06 DIAGNOSIS — Z792 Long term (current) use of antibiotics: Secondary | ICD-10-CM | POA: Diagnosis not present

## 2021-07-06 DIAGNOSIS — E876 Hypokalemia: Secondary | ICD-10-CM | POA: Diagnosis not present

## 2021-07-06 DIAGNOSIS — I509 Heart failure, unspecified: Secondary | ICD-10-CM | POA: Diagnosis not present

## 2021-07-06 DIAGNOSIS — N401 Enlarged prostate with lower urinary tract symptoms: Secondary | ICD-10-CM | POA: Diagnosis not present

## 2021-07-06 DIAGNOSIS — K219 Gastro-esophageal reflux disease without esophagitis: Secondary | ICD-10-CM | POA: Diagnosis not present

## 2021-07-06 DIAGNOSIS — D508 Other iron deficiency anemias: Secondary | ICD-10-CM | POA: Diagnosis not present

## 2021-07-06 DIAGNOSIS — I110 Hypertensive heart disease with heart failure: Secondary | ICD-10-CM | POA: Diagnosis not present

## 2021-07-06 DIAGNOSIS — I272 Pulmonary hypertension, unspecified: Secondary | ICD-10-CM | POA: Diagnosis not present

## 2021-07-06 DIAGNOSIS — Z940 Kidney transplant status: Secondary | ICD-10-CM | POA: Diagnosis not present

## 2021-07-06 DIAGNOSIS — Z5181 Encounter for therapeutic drug level monitoring: Secondary | ICD-10-CM | POA: Diagnosis not present

## 2021-07-06 DIAGNOSIS — I482 Chronic atrial fibrillation, unspecified: Secondary | ICD-10-CM | POA: Diagnosis not present

## 2021-07-06 DIAGNOSIS — D509 Iron deficiency anemia, unspecified: Secondary | ICD-10-CM | POA: Diagnosis not present

## 2021-07-06 DIAGNOSIS — E119 Type 2 diabetes mellitus without complications: Secondary | ICD-10-CM | POA: Diagnosis not present

## 2021-07-06 DIAGNOSIS — Z794 Long term (current) use of insulin: Secondary | ICD-10-CM | POA: Diagnosis not present

## 2021-07-06 DIAGNOSIS — Z79899 Other long term (current) drug therapy: Secondary | ICD-10-CM | POA: Diagnosis not present

## 2021-07-06 DIAGNOSIS — D849 Immunodeficiency, unspecified: Secondary | ICD-10-CM | POA: Diagnosis not present

## 2021-07-06 DIAGNOSIS — I10 Essential (primary) hypertension: Secondary | ICD-10-CM | POA: Diagnosis not present

## 2021-07-06 DIAGNOSIS — Z7952 Long term (current) use of systemic steroids: Secondary | ICD-10-CM | POA: Diagnosis not present

## 2021-07-06 DIAGNOSIS — Z6838 Body mass index (BMI) 38.0-38.9, adult: Secondary | ICD-10-CM | POA: Diagnosis not present

## 2021-07-06 DIAGNOSIS — Z79621 Long term (current) use of calcineurin inhibitor: Secondary | ICD-10-CM | POA: Diagnosis not present

## 2021-07-06 DIAGNOSIS — Z7960 Long term (current) use of unspecified immunomodulators and immunosuppressants: Secondary | ICD-10-CM | POA: Diagnosis not present

## 2021-07-06 DIAGNOSIS — Z7984 Long term (current) use of oral hypoglycemic drugs: Secondary | ICD-10-CM | POA: Diagnosis not present

## 2021-07-06 DIAGNOSIS — E559 Vitamin D deficiency, unspecified: Secondary | ICD-10-CM | POA: Diagnosis not present

## 2021-07-06 MED ORDER — TAMSULOSIN HCL 0.4 MG PO CAPS
ORAL_CAPSULE | ORAL | 3 refills | Status: DC
  Filled 2021-07-06: qty 180, 90d supply, fill #0

## 2021-07-06 MED ORDER — AMOXICILLIN 500 MG PO CAPS
ORAL_CAPSULE | ORAL | 5 refills | Status: DC
  Filled 2021-07-06: qty 4, 1d supply, fill #0

## 2021-07-06 NOTE — Telephone Encounter (Signed)
Pt called in wants to know status of FMLA paper work if it was filled out and sent out . Please advise [phone]  ?

## 2021-07-09 ENCOUNTER — Other Ambulatory Visit: Payer: Self-pay

## 2021-07-09 ENCOUNTER — Encounter: Payer: 59 | Admitting: Physician Assistant

## 2021-07-09 MED FILL — Omeprazole Cap Delayed Release 20 MG: ORAL | 90 days supply | Qty: 90 | Fill #2 | Status: AC

## 2021-07-10 DIAGNOSIS — Z79621 Long term (current) use of calcineurin inhibitor: Secondary | ICD-10-CM | POA: Diagnosis not present

## 2021-07-10 DIAGNOSIS — D849 Immunodeficiency, unspecified: Secondary | ICD-10-CM | POA: Diagnosis not present

## 2021-07-10 DIAGNOSIS — Z940 Kidney transplant status: Secondary | ICD-10-CM | POA: Diagnosis not present

## 2021-07-10 DIAGNOSIS — Z7960 Long term (current) use of unspecified immunomodulators and immunosuppressants: Secondary | ICD-10-CM | POA: Diagnosis not present

## 2021-07-10 DIAGNOSIS — Z5181 Encounter for therapeutic drug level monitoring: Secondary | ICD-10-CM | POA: Diagnosis not present

## 2021-07-10 DIAGNOSIS — Z4822 Encounter for aftercare following kidney transplant: Secondary | ICD-10-CM | POA: Diagnosis not present

## 2021-07-10 DIAGNOSIS — Z79899 Other long term (current) drug therapy: Secondary | ICD-10-CM | POA: Diagnosis not present

## 2021-07-10 NOTE — Telephone Encounter (Addendum)
Called and lvm for patient to call us back regarding FLMA  ?

## 2021-07-12 ENCOUNTER — Other Ambulatory Visit: Payer: Self-pay

## 2021-07-13 ENCOUNTER — Telehealth: Payer: Self-pay

## 2021-07-13 NOTE — Telephone Encounter (Signed)
Lvm for patient call us back. ?

## 2021-07-13 NOTE — Telephone Encounter (Signed)
Patient is returning missed call please advise?

## 2021-07-13 NOTE — Telephone Encounter (Signed)
Patient is calling due to missed call from [facility]. Patient thinks it maybe about his FMLA but not sure. Please advise?

## 2021-07-16 ENCOUNTER — Other Ambulatory Visit (HOSPITAL_COMMUNITY): Payer: Self-pay

## 2021-07-16 ENCOUNTER — Encounter: Payer: 59 | Admitting: Physician Assistant

## 2021-07-16 NOTE — Progress Notes (Signed)
Went to set up appt with Mc and he is going out of town the rest of week.  We discussed how he had been doing, he states good. He has been to several doctors and had good appts.  He states legs have healed and weight is staying same.  He states been working outside [employer] garden and changing his patio around.  His mood appears good.  He stays busy around the home.  Has good family support with his wife and family close by.  He loves watching his grandson.  He states he gets tired but glad he can get around and do things.  Denies weight gain.  He states stomach gets upset with potassium, he takes them prior to eating, advised him to eat first then take them, he states he forgot I suggested that, he states will try to remember to do that.   Have made a home visit with him next week.  He states has all his medications and he is aware of how to take them.  He is aware of up coming appts.  He mostly cooks everything he eats and he watches how much fluids he intakes.  Will continue to visit for heart failure, diet and medication management.  ? ?[name] ?[name] EMT-Paramedic ?[phone] ?

## 2021-07-23 ENCOUNTER — Encounter (HOSPITAL_COMMUNITY): Payer: Self-pay

## 2021-07-23 ENCOUNTER — Other Ambulatory Visit (HOSPITAL_COMMUNITY): Payer: Self-pay

## 2021-07-23 NOTE — Progress Notes (Signed)
Today had a home visit with [name].  He states been doing well.  He says legs are healed but his skin is thin, he states he will tear his skin taking on and off his compression socks.  He is using eucerin lotion after taking them off.  Advised him to ask about lympha press when he has his cardiac appt this month.  He is planning a trip to [place] this month, advised him to watch swelling, what he eats and fluids.  He usually cook what he eats.  He tries to stay busy around the home, gardening nor since warmer.  He has fun playing with grandson.  Has a beach trip planned.  He has all his medications and aware of how to take them.  He is aware of up coming appts.  He has everything for daily living.  He tries his best to do 60 ounces a day in fluids.  He tries to watch high sodium foods.  He does not add salt or cook with salt.  Edema in legs are down, Denies chest pain, headaches, dizziness or increased shortness of breath.  Cpap is working great.  Not sleeping but around 4 hrs at night.  Lungs clear.  Today is metolazone day, he states was up 2 lbs today but been urinating well this morning.  He is aware to call with weight gain.  Will continue to visit for heart failure, diet and medication management.  ? ?Sloka Volante ?[place] EMT-Paramedic ?[phone] ?

## 2021-07-24 ENCOUNTER — Other Ambulatory Visit (HOSPITAL_COMMUNITY): Payer: Self-pay | Admitting: Cardiology

## 2021-07-24 ENCOUNTER — Other Ambulatory Visit: Payer: Self-pay

## 2021-07-24 NOTE — Telephone Encounter (Signed)
Spoke to pt. He needs the FMLA forms filled out again. ?

## 2021-07-24 NOTE — Telephone Encounter (Signed)
Called and spoke to pt. He said the FMLA forms needed to be sent in again. I printed out the last ones and made corrections and faxed back to [employer]. ?

## 2021-07-25 ENCOUNTER — Other Ambulatory Visit: Payer: Self-pay

## 2021-07-25 DIAGNOSIS — E662 Morbid (severe) obesity with alveolar hypoventilation: Secondary | ICD-10-CM | POA: Diagnosis not present

## 2021-07-25 DIAGNOSIS — G4733 Obstructive sleep apnea (adult) (pediatric): Secondary | ICD-10-CM | POA: Diagnosis not present

## 2021-07-25 MED ORDER — PREDNISONE 5 MG PO TABS
5.0000 mg | ORAL_TABLET | Freq: Every day | ORAL | 3 refills | Status: DC
  Filled 2021-07-25: qty 90, 90d supply, fill #0

## 2021-07-25 MED ORDER — DAPAGLIFLOZIN PROPANEDIOL 10 MG PO TABS
10.0000 mg | ORAL_TABLET | Freq: Every day | ORAL | 3 refills | Status: DC
Start: 2021-07-25 — End: 2021-08-01
  Filled 2021-07-25: qty 90, 90d supply, fill #0

## 2021-07-25 MED ORDER — ROSUVASTATIN CALCIUM 10 MG PO TABS
10.0000 mg | ORAL_TABLET | Freq: Every day | ORAL | 3 refills | Status: DC
  Filled 2021-07-25: qty 90, 90d supply, fill #0
  Filled 2021-11-19: qty 90, 90d supply, fill #1
  Filled 2022-02-15: qty 90, 90d supply, fill #2
  Filled 2022-05-30: qty 90, 90d supply, fill #3

## 2021-07-28 DIAGNOSIS — E1165 Type 2 diabetes mellitus with hyperglycemia: Secondary | ICD-10-CM | POA: Diagnosis not present

## 2021-07-30 NOTE — Progress Notes (Signed)
?  ? ?Advanced Heart Failure Clinic Note ? ? ?Date:  08/01/2021  ? ?ID:  [name]., DOB 05-09-1960, MRN [id number]   ?Provider location: [address], [place] ?Type of Visit:  Established patient ? ?PCP:  [name], MD  ?Cardiologist:  [name] ?Nephrology: Dr Ronnald Ramp  ?HF Cardiologist: [name] ? ?HPI: ?[name]. is a 61 y.o. male  with a history of CAD s/p CABG 2005, chronic atrial fibrillation on Eliquis, ESRD s/p renal transplant 2012, anc chronic diastolic CHF.  His renal function has been fairly stable, most recent creatinine 1.43.  He has been struggling with volume overload/CHF.  He has a history of ischemic cardiomyopathy with EF as low as 35-40% in the past but echo in 3/20 showed EF 60-65%.  There is evidence for RV failure with pulmonary hypertension by echo.   ? ?Admitted 11/05/18 low grade fever and shortness of breath. HS Trop negative. Covid 19 negative. Blood CX negative. Placed on antibiotics and discharged to home the next day.  ? ?Admitted 09/23/60 with A/C Diastolic HF. Diuresed with IV lasix and transitioned to torsemide 80/40 mg . Discharge weight 252 pounds.  ? ?Admitted 06/2019 with left arm pain. Left upper extremity venous Doppler revealed occluded cephalic vein at the AV fistula outflow tract in the antecubital fossa with no other thrombus identified. Vascular evaluated. He continued on Eliquis.  No plan for intervention. Also treated for acute gout flare. Discharged on prednisone taper.    ? ?Given IV lasix on 12/21 by HF Paramedicine.  ? ?Admitted to [facility] 2/22 with acute gout flare vs cellulitis. Nephrology was consulted to evaluate transplant regimen and follow along. He was treated with antibiotics and prednisone. Discharged on decreased torsemide dose of 40 mg daily. Weight 259 lbs. ? ?Admitted 4/22 for left leg cellulitis after sustaining a dog scratch, received IV antibiotics. No changes to his diuretic therapy. Discharge weight 266  lbs. ? ?Follow up with [name], 1/23, he had CP and underwent Lexiscan myoview, which overall was a low risk study with evidence of prior infarct in the infero-apical area with no significant change from before. Losartan added to GDMT regimen. ? ?Today he returns for HF follow up. Last seen 8/22. Overall feeling fine. He has occasional dizziness when he works outside, he feels his balance is off x 1 month. Being seen at wound center for LE wound. He has dyspnea with walking up hills. Denies palpitations, abnormal bleeding, CP, edema, or PND/Orthopnea. Appetite ok. No fever or chills. Weight at home 264 pounds. Taking all medications. BP check at home 120-160s, having blood sugar fluctuations. Wears CPAP nightly. ? ?ECG (personally reviewed): atrial fibrillation 62 bpm ? ?ReDs Clip: 40% ? ?Labs (7/19): LDL 49 ?Labs (2/20): K 3.7, creatinine 1.43 ?Labs (4/20): K 4.3, creatinine 1.24 ?Labs (11/06/18): K 3.7 creatinine 1.23  ?Labs (05/07/19): K 3.8 Creatinine 1.5  ?Labs (07/18/19): K 4.3 Creatinine 1.42  ?Labs (5/21): K 3.3, creatinine 1.21 ?Labs (11/21): K 3.6, creatinine 1.4 ?Labs (12/21): K 3.3, creatinine 2.4, hgb 12.8 ?Labs (04/01/19): K 3.3 Creatinine 2.2  ?Labs (8/22): K 4.0, creatinine 1.6, HDL 35, LDL 36, a1c 7.5 ?Labs (2/23): LDL 31, HDL 36 ?Labs (4/23): K 3.3, creatinine 1.68 ? ?PMH: ?1. CAD: s/p CABG x 4 in [place] in 2005.  ?- Cardiolite (1/20): EF 47%, small fixed apical septal defect, no ischemia.  Low risk study.  ?- Cardiolite (12/22): small apical inferior defect, no changes from prior,  no ischemia. Low risk study. ?2. Asthma ?3. Gout ?4. Atrial fibrillation: Chronic.  ?5. ESRD s/p renal transplantation at [facility] in 2012.  ?6. Anemia of chronic disease ?7. Type II diabetes ?8. HTN ?9. Hyperlipidemia ?10. OSA: On bipap.  ?11. Ischemic cardiomyopathy: EF 35-40% in past.  ?- Cardiolite (1/20) with EF 47%.  ?- Echo (1/20): EF 50-55%, mild MR, RV moderately dilated with moderately decreased systolic  function, PASP 73 mmHg, moderate TR.   ?- Echo (3/20): EF 60-65%, PASP 75 mmHg, mildly dilated RV with mildly decreased systolic function.  ?- Echo (2/21): EF 45-50%, global HK, moderately dilated RV with moderately decreased systolic function, D-shaped interventricular septum, dilated IVC.  ?- RHC (2/21): mean RA 19, PA 78/31 mean 51, mean PCWP 26, CI 3.26, PVR 3.25 ?12. Obesity: s/p gastric bypass in 2015.  ?13. Bradycardia with beta blocker.  ? ?Past Surgical History:  ?Procedure Laterality Date  ? BARIATRIC SURGERY    ? CARDIAC CATHETERIZATION    ? [place]  ? CARDIAC CATHETERIZATION  05/03/2019  ? CATARACT EXTRACTION W/PHACO Right 10/29/2017  ? Procedure: CATARACT EXTRACTION PHACO AND INTRAOCULAR LENS PLACEMENT ([place])  RIGHT DIABETIC;  Surgeon: [name], MD;  Location: [place];  Service: Ophthalmology;  Laterality: Right;  Diabetic - insulin  ? CATARACT EXTRACTION W/PHACO Left 11/18/2017  ? Procedure: CATARACT EXTRACTION PHACO AND INTRAOCULAR LENS PLACEMENT ([place]) LEFT IVA/TOPICAL;  Surgeon: [name], MD;  Location: [place];  Service: Ophthalmology;  Laterality: Left;  DIABETES - insulin ?sleep apnea  ? COLONOSCOPY WITH PROPOFOL N/A 04/22/2013  ? Procedure: COLONOSCOPY WITH PROPOFOL;  Surgeon: [name], MD;  Location: [facility];  Service: Endoscopy;  Laterality: N/A;  ? CORONARY ARTERY BYPASS GRAFT  2005  ? X 4  ? DG ANGIO AV SHUNT*L*    ? right and left upper arms  ? FASCIOTOMY  03/03/2012  ? Procedure: FASCIOTOMY;  Surgeon: [name], MD;  Location: [place];  Service: Orthopedics;  Laterality: Right;  FASCIOTOMY RIGHT SMALL FINGER  ? FASCIOTOMY Left 08/17/2013  ? Procedure: FASCIOTOMY LEFT RING;  Surgeon: [name], MD;  Location: [place];  Service: Orthopedics;  Laterality: Left;  ? INCISION AND DRAINAGE ABSCESS Left 10/15/2015  ? Procedure: INCISION AND DRAINAGE ABSCESS;  Surgeon: [name], MD;  Location: [facility];   Service: General;  Laterality: Left;  ? KIDNEY TRANSPLANT  09/13/2010  ? cadaver--at [facility]  ? RIGHT HEART CATH N/A 11/15/2016  ? Procedure: RIGHT HEART CATH;  Surgeon: [name], MD;  Location: [facility];  Service: Cardiovascular;  Laterality: N/A;  ? RIGHT HEART CATH N/A 05/03/2019  ? Procedure: RIGHT HEART CATH;  Surgeon: [name], MD;  Location: [place] [facility];  Service: Cardiovascular;  Laterality: N/A;  ? TYMPANIC MEMBRANE REPAIR  1/12  ? left  ? VASECTOMY    ? ? ? ?Current Outpatient Medications  ?Medication Sig Dispense Refill  ? albuterol (PROVENTIL) (2.5 MG/3ML) 0.083% nebulizer solution Take 3 mLs (2.5 mg total) by nebulization every 6 (six) hours as needed for wheezing or shortness of breath. 150 mL 0  ? albuterol (VENTOLIN HFA) 108 (90 Base) MCG/ACT inhaler Inhale 2 puffs into the lungs every 6 (six) hours as needed for wheezing or shortness of breath. 18 g 5  ? amoxicillin (AMOXIL) 500 MG capsule Take 4 capsules (2,000 mg total) by mouth as needed (Take 1 hr before dental appointment.). 4 capsule 5  ?  apixaban (ELIQUIS) 5 MG TABS tablet TAKE 1 TABLET BY MOUTH TWICE DAILY 180 tablet 1  ? beclomethasone (QVAR) 80 MCG/ACT inhaler Inhale 2 puffs into the lungs 2 (two) times daily. 10.6 g 5  ? buPROPion (WELLBUTRIN SR) 150 MG 12 hr tablet TAKE 1 TABLET BY MOUTH TWICE DAILY 180 tablet 3  ? cephALEXin (KEFLEX) 500 MG capsule Take 1 capsule (500 mg total) by mouth 3 (three) times daily. 30 capsule 0  ? cholecalciferol (VITAMIN D3) 25 MCG (1000 UNIT) tablet Take 1,000 Units by mouth daily.    ? colchicine 0.6 MG tablet Take 1 tablet (0.6 mg total) by mouth every other day 30 tablet 1  ? Continuous Blood Gluc Sensor (FREESTYLE LIBRE 3 SENSOR) MISC Use one every 2 weeks. 6 each 3  ? Cyanocobalamin (B-12 PO) Take by mouth daily.    ? dapagliflozin propanediol (FARXIGA) 10 MG TABS tablet Take 1 tablet (10 mg total) by mouth daily before breakfast. 90 tablet 3  ? Febuxostat 80 MG TABS  Take 1 tablet (80 mg total) by mouth once daily for 180 days 90 tablet 1  ? fluticasone (FLONASE) 50 MCG/ACT nasal spray PLACE 2 SPRAYS INTO BOTH NOSTRILS DAILY. 16 g 11  ? gabapentin (NEURONTIN) 300 MG capsule

## 2021-07-31 ENCOUNTER — Telehealth (HOSPITAL_COMMUNITY): Payer: Self-pay

## 2021-07-31 NOTE — Telephone Encounter (Signed)
Called to confirm/remind patient of their appointment at the [facility] on 08/01/21.  ? ?Patient reminded to bring all medications and/or complete list. ? ?Confirmed patient has transportation. Gave directions, instructed to utilize [place] parking. ? ?Confirmed appointment prior to ending call.  ? ? ? ?

## 2021-08-01 ENCOUNTER — Ambulatory Visit (HOSPITAL_COMMUNITY)
Admission: RE | Admit: 2021-08-01 | Discharge: 2021-08-01 | Disposition: A | Discharge status: Home / Self Care | Payer: 59 | Source: Ambulatory Visit | Attending: Family Medicine | Admitting: Family Medicine

## 2021-08-01 ENCOUNTER — Encounter (HOSPITAL_COMMUNITY): Payer: Self-pay

## 2021-08-01 ENCOUNTER — Other Ambulatory Visit: Payer: Self-pay

## 2021-08-01 VITALS — BP 146/84 | HR 64 | Wt 273.8 lb

## 2021-08-01 DIAGNOSIS — I5042 Chronic combined systolic (congestive) and diastolic (congestive) heart failure: Secondary | ICD-10-CM | POA: Diagnosis not present

## 2021-08-01 DIAGNOSIS — M109 Gout, unspecified: Secondary | ICD-10-CM | POA: Diagnosis not present

## 2021-08-01 DIAGNOSIS — I5022 Chronic systolic (congestive) heart failure: Secondary | ICD-10-CM | POA: Insufficient documentation

## 2021-08-01 DIAGNOSIS — Z79899 Other long term (current) drug therapy: Secondary | ICD-10-CM | POA: Insufficient documentation

## 2021-08-01 DIAGNOSIS — G4733 Obstructive sleep apnea (adult) (pediatric): Secondary | ICD-10-CM | POA: Diagnosis not present

## 2021-08-01 DIAGNOSIS — I272 Pulmonary hypertension, unspecified: Secondary | ICD-10-CM | POA: Diagnosis not present

## 2021-08-01 DIAGNOSIS — N183 Chronic kidney disease, stage 3 unspecified: Secondary | ICD-10-CM | POA: Diagnosis not present

## 2021-08-01 DIAGNOSIS — I2510 Atherosclerotic heart disease of native coronary artery without angina pectoris: Secondary | ICD-10-CM | POA: Diagnosis not present

## 2021-08-01 DIAGNOSIS — E1121 Type 2 diabetes mellitus with diabetic nephropathy: Secondary | ICD-10-CM

## 2021-08-01 DIAGNOSIS — Z794 Long term (current) use of insulin: Secondary | ICD-10-CM

## 2021-08-01 DIAGNOSIS — Z951 Presence of aortocoronary bypass graft: Secondary | ICD-10-CM | POA: Diagnosis not present

## 2021-08-01 DIAGNOSIS — Z87891 Personal history of nicotine dependence: Secondary | ICD-10-CM | POA: Diagnosis not present

## 2021-08-01 DIAGNOSIS — E1122 Type 2 diabetes mellitus with diabetic chronic kidney disease: Secondary | ICD-10-CM | POA: Insufficient documentation

## 2021-08-01 DIAGNOSIS — I482 Chronic atrial fibrillation, unspecified: Secondary | ICD-10-CM

## 2021-08-01 DIAGNOSIS — I130 Hypertensive heart and chronic kidney disease with heart failure and stage 1 through stage 4 chronic kidney disease, or unspecified chronic kidney disease: Secondary | ICD-10-CM | POA: Diagnosis not present

## 2021-08-01 DIAGNOSIS — Z79621 Long term (current) use of calcineurin inhibitor: Secondary | ICD-10-CM | POA: Diagnosis not present

## 2021-08-01 DIAGNOSIS — Z940 Kidney transplant status: Secondary | ICD-10-CM

## 2021-08-01 DIAGNOSIS — R2681 Unsteadiness on feet: Secondary | ICD-10-CM

## 2021-08-01 DIAGNOSIS — I255 Ischemic cardiomyopathy: Secondary | ICD-10-CM | POA: Insufficient documentation

## 2021-08-01 DIAGNOSIS — M1A9XX0 Chronic gout, unspecified, without tophus (tophi): Secondary | ICD-10-CM

## 2021-08-01 DIAGNOSIS — I10 Essential (primary) hypertension: Secondary | ICD-10-CM | POA: Diagnosis not present

## 2021-08-01 DIAGNOSIS — Z7901 Long term (current) use of anticoagulants: Secondary | ICD-10-CM | POA: Diagnosis not present

## 2021-08-01 LAB — BASIC METABOLIC PANEL
Anion gap: 12 (ref 5–15)
BUN: 66 mg/dL — ABNORMAL HIGH (ref 8–23)
CO2: 31 mmol/L (ref 22–32)
Calcium: 9.5 mg/dL (ref 8.9–10.3)
Chloride: 97 mmol/L — ABNORMAL LOW (ref 98–111)
Creatinine, Ser: 2.11 mg/dL — ABNORMAL HIGH (ref 0.61–1.24)
GFR, Estimated: 35 mL/min — ABNORMAL LOW (ref >60)
Glucose, Bld: 191 mg/dL — ABNORMAL HIGH (ref 70–99)
Potassium: 3.2 mmol/L — ABNORMAL LOW (ref 3.5–5.1)
Sodium: 140 mmol/L (ref 135–145)

## 2021-08-01 LAB — BRAIN NATRIURETIC PEPTIDE: B Natriuretic Peptide: 228.3 pg/mL — ABNORMAL HIGH (ref 0.0–100.0)

## 2021-08-01 LAB — URIC ACID: Uric Acid, Serum: 8.9 mg/dL — ABNORMAL HIGH (ref 3.7–8.6)

## 2021-08-01 MED ORDER — LOSARTAN POTASSIUM 25 MG PO TABS
25.0000 mg | ORAL_TABLET | Freq: Every day | ORAL | 3 refills | Status: DC
  Filled 2021-08-01: qty 90, 90d supply, fill #0

## 2021-08-01 MED ORDER — METOLAZONE 2.5 MG PO TABS
ORAL_TABLET | ORAL | 6 refills | Status: DC
  Filled 2021-08-01: qty 4, 14d supply, fill #0
  Filled 2021-08-02: qty 6, 21d supply, fill #0
  Filled 2021-08-21 – 2021-09-07 (×2): qty 10, 35d supply, fill #1

## 2021-08-01 MED ORDER — DAPAGLIFLOZIN PROPANEDIOL 10 MG PO TABS
10.0000 mg | ORAL_TABLET | Freq: Every day | ORAL | 3 refills | Status: DC
Start: 2021-08-01 — End: 2021-08-17
  Filled 2021-08-01: qty 90, 90d supply, fill #0

## 2021-08-01 MED ORDER — SPIRONOLACTONE 25 MG PO TABS
25.0000 mg | ORAL_TABLET | Freq: Every day | ORAL | 3 refills | Status: DC
  Filled 2021-08-01: qty 18, 18d supply, fill #0
  Filled 2021-08-01: qty 72, 72d supply, fill #0
  Filled 2021-08-07: qty 90, 90d supply, fill #1

## 2021-08-01 MED ORDER — POTASSIUM CHLORIDE ER 8 MEQ PO CPCR
80.0000 meq | ORAL_CAPSULE | Freq: Two times a day (BID) | ORAL | 3 refills | Status: DC
Start: 2021-08-01 — End: 2021-09-18
  Filled 2021-08-01: qty 1800, 90d supply, fill #0

## 2021-08-01 MED ORDER — TORSEMIDE 100 MG PO TABS
100.0000 mg | ORAL_TABLET | Freq: Two times a day (BID) | ORAL | 3 refills | Status: DC
Start: 2021-08-01 — End: 2021-08-23
  Filled 2021-08-01: qty 180, 90d supply, fill #0

## 2021-08-01 NOTE — Patient Instructions (Addendum)
Medication Changes: ? ?Take extra Metolazone Saturday with an extra 40 meq (4 tabs) of Potassium ? ?Lab Work: ? ?Labs done today, your results will be available in MyChart, we will contact you for abnormal readings. ? ?Your physician recommends that you return for lab work in: 10 days, this can be done at [facility] ? ?Testing/Procedures: ? ?Your physician has requested that you have an echocardiogram. Echocardiography is a painless test that uses sound waves to create images of your heart. It provides your doctor with information about the size and shape of your heart and how well your heart?s chambers and valves are working. This procedure takes approximately one hour. There are no restrictions for this procedure. IN 3 MONTHS ? ?Referrals: ? ?NONE ? ?Special Instructions // Education: ? ?Do the following things EVERYDAY: ?Weigh yourself in the morning before breakfast. Write it down and keep it in a log. ?Take your medicines as prescribed ?Eat low salt foods--Limit salt (sodium) to 2000 mg per day.  ?Stay as active as you can everyday ?Limit all fluids for the day to less than 2 liters ? ? ?Follow-Up in: 4-6 weeks and again in 3 months with an echocardiogram ? ?At the [facility], you and your health needs are our priority. We have a designated team specialized in the treatment of Heart Failure. This Care Team includes your primary Heart Failure Specialized Cardiologist (physician), Advanced Practice Providers (APPs- Physician Assistants and Nurse Practitioners), and Pharmacist who all work together to provide you with the care you need, when you need it.  ? ?You may see any of the following providers on your designated Care Team at your next follow up: ? ?Dr Glori Bickers ?Dr Loralie Champagne ?Darrick Grinder, NP ?Lyda Jester, PA ?Jessica Milford,NP ?Marlyce Huge, PA ?Audry Riles, PharmD ? ? ?Please be sure to bring in all your medications bottles to every appointment.  ? ?Need to  Contact us: ? ?If you have any questions or concerns before your next appointment please send [name] a message through [place] or call our office at [phone].   ? ?TO LEAVE A MESSAGE FOR THE NURSE SELECT OPTION 2, PLEASE LEAVE A MESSAGE INCLUDING: ?YOUR NAME ?DATE OF BIRTH ?CALL BACK NUMBER ?REASON FOR CALL**this is important as we prioritize the call backs ? ?YOU WILL RECEIVE A CALL BACK THE SAME DAY AS LONG AS YOU CALL BEFORE 4:00 PM ? ? ?

## 2021-08-01 NOTE — Progress Notes (Signed)
ReDS Vest / Clip - 08/01/21 0900   ? ?  ? ReDS Vest / Clip  ? Station Marker D   ? Ruler Value 37   ? ReDS Value Range High volume overload   ? ReDS Actual Value 40   ? ?  ?  ? ?  ? ? ?

## 2021-08-02 ENCOUNTER — Other Ambulatory Visit: Payer: Self-pay

## 2021-08-03 ENCOUNTER — Other Ambulatory Visit: Payer: Self-pay

## 2021-08-03 ENCOUNTER — Other Ambulatory Visit: Payer: Self-pay | Admitting: Internal Medicine

## 2021-08-03 DIAGNOSIS — J309 Allergic rhinitis, unspecified: Secondary | ICD-10-CM

## 2021-08-03 MED FILL — Fluticasone Propionate Nasal Susp 50 MCG/ACT: NASAL | 30 days supply | Qty: 16 | Fill #0 | Status: AC

## 2021-08-07 ENCOUNTER — Other Ambulatory Visit: Payer: Self-pay

## 2021-08-07 ENCOUNTER — Other Ambulatory Visit (HOSPITAL_COMMUNITY): Payer: Self-pay | Admitting: Family Medicine

## 2021-08-15 ENCOUNTER — Emergency Department (HOSPITAL_COMMUNITY): Payer: 59

## 2021-08-15 ENCOUNTER — Encounter (HOSPITAL_COMMUNITY): Payer: Self-pay

## 2021-08-15 ENCOUNTER — Ambulatory Visit: Payer: 59 | Admitting: Urology

## 2021-08-15 ENCOUNTER — Other Ambulatory Visit: Payer: Self-pay

## 2021-08-15 ENCOUNTER — Encounter (HOSPITAL_COMMUNITY): Payer: Self-pay | Admitting: Internal Medicine

## 2021-08-15 ENCOUNTER — Inpatient Hospital Stay (HOSPITAL_COMMUNITY)
Admission: EM | Admit: 2021-08-15 | Discharge: 2021-08-17 | DRG: 291 | Disposition: A | Discharge status: Home / Self Care | Payer: 59 | Attending: Family Medicine | Admitting: Family Medicine

## 2021-08-15 ENCOUNTER — Encounter: Payer: Self-pay | Admitting: Urology

## 2021-08-15 ENCOUNTER — Other Ambulatory Visit (HOSPITAL_COMMUNITY): Payer: Self-pay

## 2021-08-15 ENCOUNTER — Inpatient Hospital Stay (HOSPITAL_COMMUNITY): Payer: 59

## 2021-08-15 DIAGNOSIS — R600 Localized edema: Secondary | ICD-10-CM

## 2021-08-15 DIAGNOSIS — Z7952 Long term (current) use of systemic steroids: Secondary | ICD-10-CM

## 2021-08-15 DIAGNOSIS — I480 Paroxysmal atrial fibrillation: Secondary | ICD-10-CM | POA: Diagnosis present

## 2021-08-15 DIAGNOSIS — F329 Major depressive disorder, single episode, unspecified: Secondary | ICD-10-CM | POA: Diagnosis present

## 2021-08-15 DIAGNOSIS — K219 Gastro-esophageal reflux disease without esophagitis: Secondary | ICD-10-CM | POA: Diagnosis present

## 2021-08-15 DIAGNOSIS — Z8249 Family history of ischemic heart disease and other diseases of the circulatory system: Secondary | ICD-10-CM

## 2021-08-15 DIAGNOSIS — Z87891 Personal history of nicotine dependence: Secondary | ICD-10-CM

## 2021-08-15 DIAGNOSIS — I2510 Atherosclerotic heart disease of native coronary artery without angina pectoris: Secondary | ICD-10-CM | POA: Diagnosis present

## 2021-08-15 DIAGNOSIS — I872 Venous insufficiency (chronic) (peripheral): Secondary | ICD-10-CM | POA: Diagnosis present

## 2021-08-15 DIAGNOSIS — R197 Diarrhea, unspecified: Secondary | ICD-10-CM | POA: Diagnosis present

## 2021-08-15 DIAGNOSIS — N179 Acute kidney failure, unspecified: Secondary | ICD-10-CM | POA: Diagnosis present

## 2021-08-15 DIAGNOSIS — Z841 Family history of disorders of kidney and ureter: Secondary | ICD-10-CM

## 2021-08-15 DIAGNOSIS — Z7901 Long term (current) use of anticoagulants: Secondary | ICD-10-CM

## 2021-08-15 DIAGNOSIS — I50812 Chronic right heart failure: Secondary | ICD-10-CM | POA: Diagnosis not present

## 2021-08-15 DIAGNOSIS — Z961 Presence of intraocular lens: Secondary | ICD-10-CM | POA: Diagnosis present

## 2021-08-15 DIAGNOSIS — I447 Left bundle-branch block, unspecified: Secondary | ICD-10-CM

## 2021-08-15 DIAGNOSIS — D649 Anemia, unspecified: Secondary | ICD-10-CM | POA: Diagnosis not present

## 2021-08-15 DIAGNOSIS — I5040 Unspecified combined systolic (congestive) and diastolic (congestive) heart failure: Secondary | ICD-10-CM | POA: Diagnosis not present

## 2021-08-15 DIAGNOSIS — M109 Gout, unspecified: Secondary | ICD-10-CM | POA: Diagnosis present

## 2021-08-15 DIAGNOSIS — I5023 Acute on chronic systolic (congestive) heart failure: Secondary | ICD-10-CM | POA: Diagnosis not present

## 2021-08-15 DIAGNOSIS — T8619 Other complication of kidney transplant: Secondary | ICD-10-CM | POA: Diagnosis present

## 2021-08-15 DIAGNOSIS — Z91041 Radiographic dye allergy status: Secondary | ICD-10-CM

## 2021-08-15 DIAGNOSIS — N1832 Chronic kidney disease, stage 3b: Secondary | ICD-10-CM | POA: Diagnosis not present

## 2021-08-15 DIAGNOSIS — R0602 Shortness of breath: Secondary | ICD-10-CM

## 2021-08-15 DIAGNOSIS — Z951 Presence of aortocoronary bypass graft: Secondary | ICD-10-CM

## 2021-08-15 DIAGNOSIS — Z9842 Cataract extraction status, left eye: Secondary | ICD-10-CM

## 2021-08-15 DIAGNOSIS — E1122 Type 2 diabetes mellitus with diabetic chronic kidney disease: Secondary | ICD-10-CM | POA: Diagnosis present

## 2021-08-15 DIAGNOSIS — E875 Hyperkalemia: Secondary | ICD-10-CM | POA: Diagnosis present

## 2021-08-15 DIAGNOSIS — N1330 Unspecified hydronephrosis: Secondary | ICD-10-CM | POA: Diagnosis present

## 2021-08-15 DIAGNOSIS — Z79899 Other long term (current) drug therapy: Secondary | ICD-10-CM

## 2021-08-15 DIAGNOSIS — J45909 Unspecified asthma, uncomplicated: Secondary | ICD-10-CM | POA: Diagnosis present

## 2021-08-15 DIAGNOSIS — I255 Ischemic cardiomyopathy: Secondary | ICD-10-CM | POA: Diagnosis present

## 2021-08-15 DIAGNOSIS — N401 Enlarged prostate with lower urinary tract symptoms: Secondary | ICD-10-CM | POA: Diagnosis present

## 2021-08-15 DIAGNOSIS — E1165 Type 2 diabetes mellitus with hyperglycemia: Secondary | ICD-10-CM | POA: Diagnosis not present

## 2021-08-15 DIAGNOSIS — N138 Other obstructive and reflux uropathy: Secondary | ICD-10-CM | POA: Diagnosis present

## 2021-08-15 DIAGNOSIS — I130 Hypertensive heart and chronic kidney disease with heart failure and stage 1 through stage 4 chronic kidney disease, or unspecified chronic kidney disease: Secondary | ICD-10-CM | POA: Diagnosis not present

## 2021-08-15 DIAGNOSIS — I519 Heart disease, unspecified: Secondary | ICD-10-CM | POA: Diagnosis not present

## 2021-08-15 DIAGNOSIS — Z794 Long term (current) use of insulin: Secondary | ICD-10-CM

## 2021-08-15 DIAGNOSIS — R1900 Intra-abdominal and pelvic swelling, mass and lump, unspecified site: Secondary | ICD-10-CM

## 2021-08-15 DIAGNOSIS — E662 Morbid (severe) obesity with alveolar hypoventilation: Secondary | ICD-10-CM | POA: Diagnosis present

## 2021-08-15 DIAGNOSIS — I2722 Pulmonary hypertension due to left heart disease: Secondary | ICD-10-CM | POA: Diagnosis not present

## 2021-08-15 DIAGNOSIS — Z9852 Vasectomy status: Secondary | ICD-10-CM

## 2021-08-15 DIAGNOSIS — Z6841 Body Mass Index (BMI) 40.0 and over, adult: Secondary | ICD-10-CM | POA: Diagnosis not present

## 2021-08-15 DIAGNOSIS — J45998 Other asthma: Secondary | ICD-10-CM | POA: Diagnosis present

## 2021-08-15 DIAGNOSIS — I482 Chronic atrial fibrillation, unspecified: Secondary | ICD-10-CM | POA: Diagnosis not present

## 2021-08-15 DIAGNOSIS — I5043 Acute on chronic combined systolic (congestive) and diastolic (congestive) heart failure: Secondary | ICD-10-CM | POA: Diagnosis present

## 2021-08-15 DIAGNOSIS — Z888 Allergy status to other drugs, medicaments and biological substances status: Secondary | ICD-10-CM

## 2021-08-15 DIAGNOSIS — E785 Hyperlipidemia, unspecified: Secondary | ICD-10-CM | POA: Diagnosis present

## 2021-08-15 DIAGNOSIS — I10 Essential (primary) hypertension: Secondary | ICD-10-CM | POA: Diagnosis present

## 2021-08-15 DIAGNOSIS — Z940 Kidney transplant status: Secondary | ICD-10-CM | POA: Diagnosis not present

## 2021-08-15 DIAGNOSIS — Z833 Family history of diabetes mellitus: Secondary | ICD-10-CM

## 2021-08-15 DIAGNOSIS — Z9841 Cataract extraction status, right eye: Secondary | ICD-10-CM

## 2021-08-15 DIAGNOSIS — E1169 Type 2 diabetes mellitus with other specified complication: Secondary | ICD-10-CM | POA: Diagnosis present

## 2021-08-15 LAB — CBC WITH DIFFERENTIAL/PLATELET
Abs Immature Granulocytes: 0 10*3/uL (ref 0.00–0.07)
Basophils Absolute: 0 10*3/uL (ref 0.0–0.1)
Basophils Relative: 1 %
Eosinophils Absolute: 0.1 10*3/uL (ref 0.0–0.5)
Eosinophils Relative: 1 %
HCT: 43 % (ref 39.0–52.0)
Hemoglobin: 13.3 g/dL (ref 13.0–17.0)
Immature Granulocytes: 0 %
Lymphocytes Relative: 27 %
Lymphs Abs: 1.2 10*3/uL (ref 0.7–4.0)
MCH: 27 pg (ref 26.0–34.0)
MCHC: 30.9 g/dL (ref 30.0–36.0)
MCV: 87.4 fL (ref 80.0–100.0)
Monocytes Absolute: 0.4 10*3/uL (ref 0.1–1.0)
Monocytes Relative: 8 %
Neutro Abs: 2.8 10*3/uL (ref 1.7–7.7)
Neutrophils Relative %: 63 %
Platelets: 121 10*3/uL — ABNORMAL LOW (ref 150–400)
RBC: 4.92 MIL/uL (ref 4.22–5.81)
RDW: 15.9 % — ABNORMAL HIGH (ref 11.5–15.5)
WBC: 4.5 10*3/uL (ref 4.0–10.5)
nRBC: 0 % (ref 0.0–0.2)

## 2021-08-15 LAB — I-STAT CHEM 8, ED
BUN: 51 mg/dL — ABNORMAL HIGH (ref 8–23)
Calcium, Ion: 1.2 mmol/L (ref 1.15–1.40)
Chloride: 109 mmol/L (ref 98–111)
Creatinine, Ser: 2.4 mg/dL — ABNORMAL HIGH (ref 0.61–1.24)
Glucose, Bld: 131 mg/dL — ABNORMAL HIGH (ref 70–99)
HCT: 40 % (ref 39.0–52.0)
Hemoglobin: 13.6 g/dL (ref 13.0–17.0)
Potassium: 7.2 mmol/L (ref 3.5–5.1)
Sodium: 143 mmol/L (ref 135–145)
TCO2: 29 mmol/L (ref 22–32)

## 2021-08-15 LAB — URINALYSIS, ROUTINE W REFLEX MICROSCOPIC
Bilirubin Urine: NEGATIVE
Glucose, UA: 150 mg/dL — AB
Hgb urine dipstick: NEGATIVE
Ketones, ur: NEGATIVE mg/dL
Leukocytes,Ua: NEGATIVE
Nitrite: NEGATIVE
Protein, ur: NEGATIVE mg/dL
Specific Gravity, Urine: 1.008 (ref 1.005–1.030)
pH: 5 (ref 5.0–8.0)

## 2021-08-15 LAB — COMPREHENSIVE METABOLIC PANEL
ALT: 18 U/L (ref 0–44)
AST: 30 U/L (ref 15–41)
Albumin: 3.8 g/dL (ref 3.5–5.0)
Alkaline Phosphatase: 133 U/L — ABNORMAL HIGH (ref 38–126)
Anion gap: 6 (ref 5–15)
BUN: 51 mg/dL — ABNORMAL HIGH (ref 8–23)
CO2: 29 mmol/L (ref 22–32)
Calcium: 9.2 mg/dL (ref 8.9–10.3)
Chloride: 107 mmol/L (ref 98–111)
Creatinine, Ser: 2.23 mg/dL — ABNORMAL HIGH (ref 0.61–1.24)
GFR, Estimated: 33 mL/min — ABNORMAL LOW (ref >60)
Glucose, Bld: 71 mg/dL (ref 70–99)
Potassium: 6 mmol/L — ABNORMAL HIGH (ref 3.5–5.1)
Sodium: 142 mmol/L (ref 135–145)
Total Bilirubin: 0.9 mg/dL (ref 0.3–1.2)
Total Protein: 7.4 g/dL (ref 6.5–8.1)

## 2021-08-15 LAB — GLUCOSE, CAPILLARY
Glucose-Capillary: 139 mg/dL — ABNORMAL HIGH (ref 70–99)
Glucose-Capillary: 154 mg/dL — ABNORMAL HIGH (ref 70–99)

## 2021-08-15 LAB — TROPONIN I (HIGH SENSITIVITY)
Troponin I (High Sensitivity): 35 ng/L — ABNORMAL HIGH (ref <18)
Troponin I (High Sensitivity): 35 ng/L — ABNORMAL HIGH (ref <18)

## 2021-08-15 LAB — BRAIN NATRIURETIC PEPTIDE: B Natriuretic Peptide: 259.3 pg/mL — ABNORMAL HIGH (ref 0.0–100.0)

## 2021-08-15 LAB — HEMOGLOBIN A1C
Hgb A1c MFr Bld: 7.6 % — ABNORMAL HIGH (ref 4.8–5.6)
Mean Plasma Glucose: 171.42 mg/dL

## 2021-08-15 LAB — BASIC METABOLIC PANEL
Anion gap: 8 (ref 5–15)
BUN: 51 mg/dL — ABNORMAL HIGH (ref 8–23)
CO2: 26 mmol/L (ref 22–32)
Calcium: 9.5 mg/dL (ref 8.9–10.3)
Chloride: 107 mmol/L (ref 98–111)
Creatinine, Ser: 2.35 mg/dL — ABNORMAL HIGH (ref 0.61–1.24)
GFR, Estimated: 31 mL/min — ABNORMAL LOW (ref >60)
Glucose, Bld: 164 mg/dL — ABNORMAL HIGH (ref 70–99)
Potassium: 6.1 mmol/L — ABNORMAL HIGH (ref 3.5–5.1)
Sodium: 141 mmol/L (ref 135–145)

## 2021-08-15 LAB — CBG MONITORING, ED: Glucose-Capillary: 109 mg/dL — ABNORMAL HIGH (ref 70–99)

## 2021-08-15 LAB — CREATININE, URINE, RANDOM: Creatinine, Urine: 26.92 mg/dL

## 2021-08-15 LAB — SODIUM, URINE, RANDOM: Sodium, Ur: 106 mmol/L

## 2021-08-15 LAB — HIV ANTIBODY (ROUTINE TESTING W REFLEX): HIV Screen 4th Generation wRfx: NONREACTIVE

## 2021-08-15 MED ORDER — SODIUM ZIRCONIUM CYCLOSILICATE 10 G PO PACK
10.0000 g | PACK | Freq: Two times a day (BID) | ORAL | Status: DC
  Administered 2021-08-15: 10 g via ORAL
  Filled 2021-08-15: qty 1

## 2021-08-15 MED ORDER — SODIUM ZIRCONIUM CYCLOSILICATE 10 G PO PACK
10.0000 g | PACK | Freq: Two times a day (BID) | ORAL | Status: AC
  Administered 2021-08-15: 10 g via ORAL
  Filled 2021-08-15: qty 1

## 2021-08-15 MED ORDER — PANTOPRAZOLE SODIUM 40 MG PO TBEC
40.0000 mg | DELAYED_RELEASE_TABLET | Freq: Every day | ORAL | Status: DC
  Administered 2021-08-16 – 2021-08-17 (×2): 40 mg via ORAL
  Filled 2021-08-15 (×2): qty 1

## 2021-08-15 MED ORDER — ZOLPIDEM TARTRATE 5 MG PO TABS
5.0000 mg | ORAL_TABLET | Freq: Every evening | ORAL | Status: DC | PRN
  Administered 2021-08-16: 5 mg via ORAL
  Filled 2021-08-15: qty 1

## 2021-08-15 MED ORDER — CALCIUM CARBONATE ANTACID 1250 MG/5ML PO SUSP
500.0000 mg | Freq: Four times a day (QID) | ORAL | Status: DC | PRN
  Filled 2021-08-15: qty 5

## 2021-08-15 MED ORDER — GABAPENTIN 300 MG PO CAPS
300.0000 mg | ORAL_CAPSULE | Freq: Two times a day (BID) | ORAL | Status: DC
  Administered 2021-08-15 – 2021-08-17 (×4): 300 mg via ORAL
  Filled 2021-08-15 (×4): qty 1

## 2021-08-15 MED ORDER — ALBUTEROL SULFATE (2.5 MG/3ML) 0.083% IN NEBU
10.0000 mg | INHALATION_SOLUTION | Freq: Once | RESPIRATORY_TRACT | Status: AC
  Administered 2021-08-15: 10 mg via RESPIRATORY_TRACT
  Filled 2021-08-15: qty 12

## 2021-08-15 MED ORDER — DEXTROSE 50 % IV SOLN
1.0000 | Freq: Once | INTRAVENOUS | Status: AC
  Administered 2021-08-15: 50 mL via INTRAVENOUS
  Filled 2021-08-15: qty 50

## 2021-08-15 MED ORDER — BUPROPION HCL ER (SR) 150 MG PO TB12
150.0000 mg | ORAL_TABLET | Freq: Two times a day (BID) | ORAL | Status: DC
Start: 2021-08-15 — End: 2021-08-17
  Administered 2021-08-15 – 2021-08-17 (×4): 150 mg via ORAL
  Filled 2021-08-15 (×4): qty 1

## 2021-08-15 MED ORDER — FUROSEMIDE 10 MG/ML IJ SOLN
40.0000 mg | Freq: Once | INTRAMUSCULAR | Status: AC
  Administered 2021-08-15: 40 mg via INTRAVENOUS
  Filled 2021-08-15: qty 4

## 2021-08-15 MED ORDER — FLUTICASONE PROPIONATE 50 MCG/ACT NA SUSP
2.0000 | Freq: Every day | NASAL | Status: DC
  Administered 2021-08-15 – 2021-08-16 (×2): 2 via NASAL
  Filled 2021-08-15: qty 16

## 2021-08-15 MED ORDER — BUDESONIDE 0.25 MG/2ML IN SUSP
0.2500 mg | Freq: Two times a day (BID) | RESPIRATORY_TRACT | Status: DC
  Administered 2021-08-16 – 2021-08-17 (×2): 0.25 mg via RESPIRATORY_TRACT
  Filled 2021-08-15 (×4): qty 2

## 2021-08-15 MED ORDER — FEBUXOSTAT 80 MG PO TABS
80.0000 mg | ORAL_TABLET | Freq: Every day | ORAL | Status: DC
Start: 2021-08-16 — End: 2021-08-15

## 2021-08-15 MED ORDER — MONTELUKAST SODIUM 10 MG PO TABS
10.0000 mg | ORAL_TABLET | Freq: Every day | ORAL | Status: DC
Start: 2021-08-15 — End: 2021-08-17
  Administered 2021-08-15 – 2021-08-16 (×2): 10 mg via ORAL
  Filled 2021-08-15 (×2): qty 1

## 2021-08-15 MED ORDER — ENOXAPARIN SODIUM 30 MG/0.3ML IJ SOSY: 30.0000 mg | PREFILLED_SYRINGE | INTRAMUSCULAR | Status: DC

## 2021-08-15 MED ORDER — SORBITOL 70 % SOLN: 30.0000 mL | Status: DC | PRN

## 2021-08-15 MED ORDER — INSULIN GLARGINE-YFGN 100 UNIT/ML ~~LOC~~ SOPN: 40.0000 [IU] | PEN_INJECTOR | SUBCUTANEOUS | Status: DC

## 2021-08-15 MED ORDER — NEPRO/CARBSTEADY PO LIQD: 237.0000 mL | Freq: Three times a day (TID) | ORAL | Status: DC | PRN

## 2021-08-15 MED ORDER — INSULIN GLARGINE-YFGN 100 UNIT/ML ~~LOC~~ SOLN: 40.0000 [IU] | Freq: Every day | SUBCUTANEOUS | Status: DC

## 2021-08-15 MED ORDER — ALBUTEROL SULFATE (2.5 MG/3ML) 0.083% IN NEBU: 2.5000 mg | INHALATION_SOLUTION | RESPIRATORY_TRACT | Status: DC | PRN

## 2021-08-15 MED ORDER — HYDRALAZINE HCL 20 MG/ML IJ SOLN: 5.0000 mg | INTRAMUSCULAR | Status: DC | PRN

## 2021-08-15 MED ORDER — DOCUSATE SODIUM 283 MG RE ENEM: 1.0000 | ENEMA | RECTAL | Status: DC | PRN

## 2021-08-15 MED ORDER — TAMSULOSIN HCL 0.4 MG PO CAPS
0.4000 mg | ORAL_CAPSULE | Freq: Every day | ORAL | Status: DC
  Administered 2021-08-16 – 2021-08-17 (×2): 0.4 mg via ORAL
  Filled 2021-08-15 (×2): qty 1

## 2021-08-15 MED ORDER — INSULIN ASPART 100 UNIT/ML IJ SOLN
0.0000 [IU] | Freq: Three times a day (TID) | INTRAMUSCULAR | Status: DC
  Administered 2021-08-16 – 2021-08-17 (×3): 3 [IU] via SUBCUTANEOUS

## 2021-08-15 MED ORDER — SULFAMETHOXAZOLE-TRIMETHOPRIM 400-80 MG PO TABS
1.0000 | ORAL_TABLET | ORAL | Status: DC
  Administered 2021-08-17: 1 via ORAL
  Filled 2021-08-15: qty 1

## 2021-08-15 MED ORDER — TADALAFIL 20 MG PO TABS
20.0000 mg | ORAL_TABLET | ORAL | Status: DC
  Administered 2021-08-16: 20 mg via ORAL
  Filled 2021-08-15: qty 1

## 2021-08-15 MED ORDER — HYDROXYZINE HCL 25 MG PO TABS: 25.0000 mg | ORAL_TABLET | Freq: Three times a day (TID) | ORAL | Status: DC | PRN

## 2021-08-15 MED ORDER — SODIUM ZIRCONIUM CYCLOSILICATE 10 G PO PACK: 10.0000 g | PACK | Freq: Once | ORAL | Status: DC

## 2021-08-15 MED ORDER — ACETAMINOPHEN 325 MG PO TABS
650.0000 mg | ORAL_TABLET | Freq: Four times a day (QID) | ORAL | Status: DC | PRN
  Administered 2021-08-15: 650 mg via ORAL
  Filled 2021-08-15: qty 2

## 2021-08-15 MED ORDER — SODIUM ZIRCONIUM CYCLOSILICATE 10 G PO PACK
10.0000 g | PACK | Freq: Once | ORAL | Status: AC
  Administered 2021-08-15: 10 g via ORAL
  Filled 2021-08-15: qty 1

## 2021-08-15 MED ORDER — PREDNISONE 5 MG PO TABS
5.0000 mg | ORAL_TABLET | Freq: Every day | ORAL | Status: DC
  Administered 2021-08-16 – 2021-08-17 (×2): 5 mg via ORAL
  Filled 2021-08-15 (×2): qty 1

## 2021-08-15 MED ORDER — FUROSEMIDE 10 MG/ML IJ SOLN
80.0000 mg | Freq: Three times a day (TID) | INTRAMUSCULAR | Status: DC
Start: 2021-08-15 — End: 2021-08-17
  Administered 2021-08-15 – 2021-08-17 (×5): 80 mg via INTRAVENOUS
  Filled 2021-08-15 (×5): qty 8

## 2021-08-15 MED ORDER — SODIUM CHLORIDE 0.9 % IV BOLUS
500.0000 mL | Freq: Once | INTRAVENOUS | Status: AC
  Administered 2021-08-15: 500 mL via INTRAVENOUS

## 2021-08-15 MED ORDER — INSULIN GLARGINE-YFGN 100 UNIT/ML ~~LOC~~ SOLN
40.0000 [IU] | Freq: Every day | SUBCUTANEOUS | Status: DC
  Administered 2021-08-16 (×2): 40 [IU] via SUBCUTANEOUS
  Filled 2021-08-15 (×3): qty 0.4

## 2021-08-15 MED ORDER — INSULIN ASPART 100 UNIT/ML IV SOLN
5.0000 [IU] | Freq: Once | INTRAVENOUS | Status: AC
  Administered 2021-08-15: 5 [IU] via INTRAVENOUS

## 2021-08-15 MED ORDER — TACROLIMUS 1 MG PO CAPS
2.0000 mg | ORAL_CAPSULE | Freq: Two times a day (BID) | ORAL | Status: DC
  Administered 2021-08-15 – 2021-08-17 (×4): 2 mg via ORAL
  Filled 2021-08-15 (×4): qty 2

## 2021-08-15 MED ORDER — CAMPHOR-MENTHOL 0.5-0.5 % EX LOTN: 1.0000 "application " | TOPICAL_LOTION | Freq: Three times a day (TID) | CUTANEOUS | Status: DC | PRN

## 2021-08-15 MED ORDER — CALCIUM GLUCONATE 10 % IV SOLN
1.0000 g | Freq: Once | INTRAVENOUS | Status: AC
  Administered 2021-08-15: 1 g via INTRAVENOUS
  Filled 2021-08-15: qty 10

## 2021-08-15 MED ORDER — FUROSEMIDE 10 MG/ML IJ SOLN: 40.0000 mg | Freq: Two times a day (BID) | INTRAMUSCULAR | Status: DC

## 2021-08-15 MED ORDER — MYCOPHENOLATE SODIUM 180 MG PO TBEC
360.0000 mg | DELAYED_RELEASE_TABLET | Freq: Two times a day (BID) | ORAL | Status: DC
  Administered 2021-08-15 – 2021-08-17 (×4): 360 mg via ORAL
  Filled 2021-08-15 (×4): qty 2

## 2021-08-15 MED ORDER — ONDANSETRON HCL 4 MG/2ML IJ SOLN: 4.0000 mg | Freq: Four times a day (QID) | INTRAMUSCULAR | Status: DC | PRN

## 2021-08-15 MED ORDER — APIXABAN 5 MG PO TABS
5.0000 mg | ORAL_TABLET | Freq: Two times a day (BID) | ORAL | Status: DC
  Administered 2021-08-15 – 2021-08-17 (×4): 5 mg via ORAL
  Filled 2021-08-15 (×4): qty 1

## 2021-08-15 MED ORDER — SODIUM CHLORIDE 0.9% FLUSH
3.0000 mL | Freq: Two times a day (BID) | INTRAVENOUS | Status: DC
  Administered 2021-08-16 – 2021-08-17 (×3): 3 mL via INTRAVENOUS

## 2021-08-15 MED ORDER — ALBUTEROL SULFATE HFA 108 (90 BASE) MCG/ACT IN AERS: 2.0000 | INHALATION_SPRAY | RESPIRATORY_TRACT | Status: DC | PRN

## 2021-08-15 MED ORDER — ONDANSETRON HCL 4 MG PO TABS: 4.0000 mg | ORAL_TABLET | Freq: Four times a day (QID) | ORAL | Status: DC | PRN

## 2021-08-15 MED ORDER — ACETAMINOPHEN 650 MG RE SUPP: 650.0000 mg | Freq: Four times a day (QID) | RECTAL | Status: DC | PRN

## 2021-08-15 MED ORDER — COLCHICINE 0.6 MG PO TABS
0.6000 mg | ORAL_TABLET | ORAL | Status: DC
  Administered 2021-08-16: 0.6 mg via ORAL
  Filled 2021-08-15 (×2): qty 1

## 2021-08-15 MED ORDER — ROSUVASTATIN CALCIUM 5 MG PO TABS
10.0000 mg | ORAL_TABLET | Freq: Every day | ORAL | Status: DC
  Administered 2021-08-16 – 2021-08-17 (×2): 10 mg via ORAL
  Filled 2021-08-15 (×2): qty 2

## 2021-08-15 MED ORDER — INSULIN ASPART 100 UNIT/ML IJ SOLN
0.0000 [IU] | Freq: Every day | INTRAMUSCULAR | Status: DC
  Administered 2021-08-16: 3 [IU] via SUBCUTANEOUS

## 2021-08-15 NOTE — Progress Notes (Signed)
Today had a home visit with [name]  He is up almost 10 lbs since Sunday.  He states took his metolazone yesterday and did not have good urine output.  Today he has not either.  His abdomen is full, tight and tender.  Legs not much more edema than normal.  Small crackles noted in left lower lungs and wheeze in bottom right lobe.  He is short of breath with short distance of ambulation.  BP is elevated.  He went to beach this past weekend, did a lot of walking and was very active since came back can not get the fluid off. He is refusing to go to ED, his creatine was elevated, so there me giving IV lasix without blood work will not work and advised him.  Contacted Dr Aundra Dubin office and they advised if he came there they would send him to ED also.  He finally said he would go to ED in [place], advised him that is his best option to get them to diurese him in a safe enviroment.  He has not eaten or took his insulin this am.  Will follow his hospital stay and visit with him after discharge. Will visit for heart failure, diet and medication management.   [place] [phone]

## 2021-08-15 NOTE — Consult Note (Addendum)
Reason for Consult: Renal failure Referring Physician:  Dr. Lorin Mercy  Chief Complaint: Shortness of breath  Assessment/Plan:  1) Renal transplant status CKD stage IIIB with diabetic nephropathy-biopsy 2017   Immunosuppression induction with Campath, now maintenance with tacrolimus and mycophenolate. 1 month allograft biopsy July 2012- recovering ATN with early tubular atrophy, interstitial fibrosis. Repeat biopsy March 2017 for increasing proteinuria- no acute rejection, showed diabetic nephropathy, FSGS. 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   Followed by transplant center [facility] in [place].  Last seen on 07/10/2021 at which time the Cr was 1.68.  - Continue home dose of immunosuppression Currently mycophenolate (Myfortic) 360 mg twice a day and Prograf dose 2 mg in a.m. and 2 mg in the afternoon.    - Will check a prograf level, transplant kidney ultrasound and a PVR to r/o obstructive uropathy as pt is on Flomax 0.87m BID. - Will also check urine studies. - Increase Lasix to [id number]. - Lokelma 10gm BID  - no absolute indication for RRT and the patient appears to be  comfortable. - -Monitor Daily I/Os, Daily weight  -Maintain MAP>65 for optimal renal perfusion.  -Avoid nephrotoxic medications including NSAIDs, judicious use of contrast, avoid gadolinium.   2) Pulmonary hypertension, lower extremity edema Usually on an aggressive diuretic regimen of torsemide 100 mg twice a day Follow strict low-sodium diet; he is very compliant with his diet   3) DM-2 on insulin therapy per primary  4) Atrial fibrillation on Eliquis  5) Severe pulmonary HTN - on Tadalafil  6) CHF (combined systolic and diastolic) with EF 3519% only mild vascular congestion on CXR  7) BPH on Flomax  8) Gout on Uloric.  9) Chronic venous stasis usually treated with diuretics    HPI: Carl FlImre Vecchioneis an 89.o. male  DM, HTN, HLD, OSA, CASHD s/p CABG 2005, PAD, combined systolic and diastolic CHF, atrial fibrillation, gout, [facility] [place] is a very compliant patient adhering to a low sodium diet, very active but noted that he was more short of breath and only able to walk 1/4 mile. He also has had orthopnea and noted that his abdomen has been getting larger with increasing weights. He also states that he has noted less urine coming out despite being compliant with the Torsemide and taking another Metolazone 2.66m71mesterday. He denies fever, chills, dysuria, cough, chest pain, nausea. He also denies nausea, vomiting, dizziness, lightheadedness, headache,  blurry vision. Takes Torsemide 100m14mD, Metolazone 2.66mg 72mce weekly   ROS Pertinent items are noted in HPI.  Chemistry and CBC: Creatinine  Date/Time Value Ref Range Status  06/16/2014 09:11 AM 0.83 mg/dL Final    Comment:    0.61-1.24 NOTE: New Reference Range  05/31/14   03/16/2014 06:55 AM 1.62 (H) 0.60 - 1.30 mg/dL Final  01/28/2014 08:54 AM 1.51 (H) 0.60 - 1.30 mg/dL Final  07/22/2013 11:08 AM 1.13 0.60 - 1.30 mg/dL Final  03/10/2013 09:49 AM 1.17 0.60 - 1.30 mg/dL Final  12/16/2012 10:24 AM 1.15 0.60 - 1.30 mg/dL Final  10/30/2012 09:44 AM 1.51 (H) 0.60 - 1.30 mg/dL Final  10/23/2012 10:27 AM 1.16 0.60 - 1.30 mg/dL Final  10/20/2012 10:33 AM 1.20 0.60 - 1.30 mg/dL Final  10/12/2012 02:36 PM 1.55 (H) 0.60 - 1.30 mg/dL Final  05/09/2012 08:03 AM 1.20 0.60 - 1.30 mg/dL Final   Creatinine, Ser  Date/Time Value Ref Range Status  08/15/2021 02:12 PM 2.40 (H) 0.61 - 1.24 mg/dL Final  08/15/2021 11:59 AM 2.23 (H) 0.61 - 1.24 mg/dL Final  08/01/2021 09:22 AM 2.11 (H) 0.61 - 1.24 mg/dL Final  05/07/2021 09:35 AM 1.68 (H) 0.61 - 1.24 mg/dL Final  04/04/2021 10:19 AM 1.74 (H) 0.61 - 1.24 mg/dL Final  03/01/2021 11:13 AM 1.79 (H) 0.61 - 1.24 mg/dL Final  10/30/2020 12:17 PM 1.64 (H) 0.61 - 1.24 mg/dL Final   07/24/2020 05:26 AM 1.84 (H) 0.61 - 1.24 mg/dL Final  07/23/2020 04:55 AM 1.69 (H) 0.61 - 1.24 mg/dL Final  07/22/2020 02:59 PM 2.11 (H) 0.61 - 1.24 mg/dL Final  07/17/2020 10:32 AM 1.75 (H) 0.61 - 1.24 mg/dL Final  07/11/2020 09:36 AM 1.69 (H) 0.61 - 1.24 mg/dL Final  06/30/2020 09:20 AM 2.12 (H) 0.61 - 1.24 mg/dL Final  06/26/2020 09:18 AM 2.60 (H) 0.61 - 1.24 mg/dL Final  06/15/2020 12:24 PM 1.99 (H) 0.61 - 1.24 mg/dL Final  05/17/2020 07:26 AM 1.95 (H) 0.61 - 1.24 mg/dL Final  05/17/2020 04:12 AM 1.95 (H) 0.61 - 1.24 mg/dL Final  05/15/2020 04:11 AM 2.06 (H) 0.61 - 1.24 mg/dL Final  05/14/2020 05:50 AM 2.22 (H) 0.61 - 1.24 mg/dL Final  05/01/2020 08:26 AM 2.21 (H) 0.61 - 1.24 mg/dL Final  04/17/2020 10:09 AM 1.84 (H) 0.61 - 1.24 mg/dL Final  04/03/2020 10:21 AM 2.38 (H) 0.61 - 1.24 mg/dL Final  03/31/2020 10:35 AM 2.17 (H) 0.61 - 1.24 mg/dL Final  03/27/2020 02:09 PM 2.20 (H) 0.61 - 1.24 mg/dL Final  03/07/2020 01:04 PM 1.68 (H) 0.61 - 1.24 mg/dL Final  02/23/2020 03:02 PM 2.40 (H) 0.61 - 1.24 mg/dL Final  11/15/2019 12:09 PM 1.64 (H) 0.61 - 1.24 mg/dL Final  09/14/2019 10:30 AM 1.41 (H) 0.61 - 1.24 mg/dL Final  08/16/2019 10:36 AM 1.61 (H) 0.61 - 1.24 mg/dL Final  08/04/2019 08:08 AM 1.21 0.61 - 1.24 mg/dL Final  07/18/2019 04:50 AM 1.41 (H) 0.61 - 1.24 mg/dL Final  07/17/2019 07:41 AM 1.95 (H) 0.61 - 1.24 mg/dL Final  07/16/2019 06:39 AM 2.10 (H) 0.61 - 1.24 mg/dL Final  07/15/2019 06:15 AM 1.89 (H) 0.61 - 1.24 mg/dL Final  07/15/2019 06:15 AM 1.98 (H) 0.61 - 1.24 mg/dL Final  07/14/2019 04:24 AM 1.88 (H) 0.61 - 1.24 mg/dL Final  07/12/2019 12:30 PM 1.40 (H) 0.61 - 1.24 mg/dL Final  07/04/2019 05:55 AM 1.87 (H) 0.61 - 1.24 mg/dL Final  07/03/2019 07:15 AM 1.92 (H) 0.61 - 1.24 mg/dL Final  07/02/2019 06:57 AM 1.67 (H) 0.61 - 1.24 mg/dL Final  07/01/2019 05:54 PM 1.90 (H) 0.61 - 1.24 mg/dL Final  07/01/2019 03:09 AM 1.88 (H) 0.61 - 1.24 mg/dL Final  06/30/2019 08:06 PM 1.78  (H) 0.61 - 1.24 mg/dL Final  06/10/2019 10:19 AM 1.70 (H) 0.76 - 1.27 mg/dL Final  05/18/2019 09:47 AM 1.74 (H) 0.61 - 1.24 mg/dL Final  05/07/2019 03:01 AM 1.50 (H) 0.61 - 1.24 mg/dL Final  05/06/2019 03:47 AM 1.51 (H) 0.61 - 1.24 mg/dL Final  05/05/2019 04:04 AM 1.54 (H) 0.61 - 1.24 mg/dL Final  05/04/2019 03:19 AM 1.71 (H) 0.61 - 1.24 mg/dL Final  05/03/2019 01:07 PM 1.86 (H) 0.61 - 1.24 mg/dL Final  05/01/2019 12:52 PM 1.63 (H) 0.61 - 1.24 mg/dL Final  04/30/2019 11:05 AM 1.89 (H) 0.61 - 1.24 mg/dL Final   Recent Labs  Lab 08/15/21 1159 08/15/21 1412  NA 142 143  K 6.0* 7.2*  CL 107 109  CO2 29  --   GLUCOSE 71 131*  BUN 51* 51*  CREATININE 2.23* 2.40*  CALCIUM 9.2  --    Recent Labs  Lab 08/15/21 1159 08/15/21 1412  WBC 4.5  --   NEUTROABS 2.8  --   HGB 13.3 13.6  HCT 43.0 40.0  MCV 87.4  --   PLT 121*  --    Liver Function Tests: Recent Labs  Lab 08/15/21 1159  AST 30  ALT 18  ALKPHOS 133*  BILITOT 0.9  PROT 7.4  ALBUMIN 3.8   No results for input(s): LIPASE, AMYLASE in the last 168 hours. No results for input(s): AMMONIA in the last 168 hours. Cardiac Enzymes: No results for input(s): CKTOTAL, CKMB, CKMBINDEX, TROPONINI in the last 168 hours. Iron Studies: No results for input(s): IRON, TIBC, TRANSFERRIN, FERRITIN in the last 72 hours. PT/INR: _0 (inr:5)  Xrays/Other Studies: ) Results for orders placed or performed during the hospital encounter of 08/15/21 (from the past 48 hour(s))  CBC with Differential     Status: Abnormal   Collection Time: 08/15/21 11:59 AM  Result Value Ref Range   WBC 4.5 4.0 - 10.5 K/uL   RBC 4.92 4.22 - 5.81 MIL/uL   Hemoglobin 13.3 13.0 - 17.0 g/dL   HCT 43.0 39.0 - 52.0 %   MCV 87.4 80.0 - 100.0 fL   MCH 27.0 26.0 - 34.0 pg   MCHC 30.9 30.0 - 36.0 g/dL   RDW 15.9 (H) 11.5 - 15.5 %   Platelets 121 (L) 150 - 400 K/uL   nRBC 0.0 0.0 - 0.2 %   Neutrophils Relative % 63 %   Neutro Abs 2.8 1.7 - 7.7 K/uL    Lymphocytes Relative 27 %   Lymphs Abs 1.2 0.7 - 4.0 K/uL   Monocytes Relative 8 %   Monocytes Absolute 0.4 0.1 - 1.0 K/uL   Eosinophils Relative 1 %   Eosinophils Absolute 0.1 0.0 - 0.5 K/uL   Basophils Relative 1 %   Basophils Absolute 0.0 0.0 - 0.1 K/uL   Immature Granulocytes 0 %   Abs Immature Granulocytes 0.00 0.00 - 0.07 K/uL    Comment: Performed at [facility], [name], [place], [place] [address]  Comprehensive metabolic panel     Status: Abnormal   Collection Time: 08/15/21 11:59 AM  Result Value Ref Range   Sodium 142 135 - 145 mmol/L   Potassium 6.0 (H) 3.5 - 5.1 mmol/L   Chloride 107 98 - 111 mmol/L   CO2 29 22 - 32 mmol/L   Glucose, Bld 71 70 - 99 mg/dL    Comment: Glucose reference range applies only to samples taken after fasting for at least 8 hours.   BUN 51 (H) 8 - 23 mg/dL   Creatinine, Ser 2.23 (H) 0.61 - 1.24 mg/dL   Calcium 9.2 8.9 - 10.3 mg/dL   Total Protein 7.4 6.5 - 8.1 g/dL   Albumin 3.8 3.5 - 5.0 g/dL   AST 30 15 - 41 U/L   ALT 18 0 - 44 U/L   Alkaline Phosphatase 133 (H) 38 - 126 U/L   Total Bilirubin 0.9 0.3 - 1.2 mg/dL   GFR, Estimated 33 (L) >60 mL/min    Comment: (NOTE) Calculated using the CKD-EPI Creatinine Equation (2021)    Anion gap 6 5 - 15    Comment: Performed at [facility] [address] [place], [place] [address]  Troponin I (High Sensitivity)  Status: Abnormal   Collection Time: 08/15/21 11:59 AM  Result Value Ref Range   Troponin I (High Sensitivity) 35 (H) <18 ng/L    Comment: (NOTE) Elevated high sensitivity troponin I (hsTnI) values and significant  changes across serial measurements may suggest ACS but many other  chronic and acute conditions are known to elevate hsTnI results.  Refer to the "Links" section for chest pain algorithms and additional  guidance. Performed at [facility], [place] [address] [place], [place] [address]   Brain natriuretic peptide     Status: Abnormal    Collection Time: 08/15/21 11:59 AM  Result Value Ref Range   B Natriuretic Peptide 259.3 (H) 0.0 - 100.0 pg/mL    Comment: Performed at [facility] [address] [place], [place] [address]  I-stat chem 8, ED (not at [facility] or [facility])     Status: Abnormal   Collection Time: 08/15/21  2:12 PM  Result Value Ref Range   Sodium 143 135 - 145 mmol/L   Potassium 7.2 (HH) 3.5 - 5.1 mmol/L   Chloride 109 98 - 111 mmol/L   BUN 51 (H) 8 - 23 mg/dL   Creatinine, Ser 2.40 (H) 0.61 - 1.24 mg/dL   Glucose, Bld 131 (H) 70 - 99 mg/dL    Comment: Glucose reference range applies only to samples taken after fasting for at least 8 hours.   Calcium, Ion 1.20 1.15 - 1.40 mmol/L   TCO2 29 22 - 32 mmol/L   Hemoglobin 13.6 13.0 - 17.0 g/dL   HCT 40.0 39.0 - 52.0 %   Comment NOTIFIED PHYSICIAN   Troponin I (High Sensitivity)     Status: Abnormal   Collection Time: 08/15/21  2:36 PM  Result Value Ref Range   Troponin I (High Sensitivity) 35 (H) <18 ng/L    Comment: (NOTE) Elevated high sensitivity troponin I (hsTnI) values and significant  changes across serial measurements may suggest ACS but many other  chronic and acute conditions are known to elevate hsTnI results.  Refer to the "Links" section for chest pain algorithms and additional  guidance. Performed at [facility], [place] [address] [place], [place] [address]    *Note: Due to a large number of results and/or encounters for the requested time period, some results have not been displayed. A complete set of results can be found in Results Review.   DG Chest 2 View  Result Date: 08/15/2021 CLINICAL DATA:  Short of breath EXAM: CHEST - 2 VIEW COMPARISON:  07/12/2019 FINDINGS: Prior CABG. Cardiac enlargement. Mild vascular congestion. Negative for pulmonary edema or pleural effusion. Negative for pneumonia. Calcified right paratracheal lymph node unchanged. IMPRESSION: Cardiac enlargement with mild vascular congestion. Negative for edema or  effusion. Electronically Signed   By: Franchot Gallo M.D.   On: 08/15/2021 11:42    PMH:   Past Medical History:  Diagnosis Date   Asthma, persistent controlled 02/25/2013   Attention or concentration deficit 04/26/2021   BPH with obstruction/lower urinary tract symptoms 09/25/2014   CAD (coronary artery disease) 2005   a.  Status post four-vessel CABG in 2005; b. 03/2018 MV: Small, fixed inferoapical and apical septal defect. No ischemia. EF 47% (60-65% by 05/2018 Echo).   Cellulitis and abscess of left leg 07/22/2020   Chronic atrial fibrillation    a. CHADS2VASc 4 (CHF, HTN, DM, vascular disease)--> Eliquis; b. 09/2018 Zio: Chronic Afib.   Chronic combined systolic and diastolic CHF (congestive heart failure)  a.  7/18 Echo: EF 45-50%; b. 05/2018 Echo: EF 60-65%, RVSP 74.8; c. 12/2018 Echo: EF 50-55%. Sev dil LA; d. 04/2019 Echo: EF 45-50%, glob HK. Mod reduced RV fxn. RVSP 53.89mHg.   Chronic venous stasis dermatitis of both lower extremities    Cytomegaloviral disease 2017   Diverticulosis of colon 04/22/2013   Essential hypertension 12/17/2010   GERD (gastroesophageal reflux disease)    Gout attack 03/30/2018   History of renal transplant 03/01/2013   Hyperlipidemia    low since renal failure   Hypogonadism in male 09/25/2014   Major depressive disorder 10/04/2013   Obesity hypoventilation syndrome 03/31/2012   Obstructive sleep apnea 09/29/2012   NPSG [place] 03/15/12- AHI 57.9/ hr, desaturation to 78.8%, body weight 310 lbs   PAH (pulmonary artery hypertension)    a. 05/2018 Echo: RVSP 74.864mg; b. 12/2018 RV not well visualized; c. 04/2019 RHC: RA 19, RV 80/20, PA 78/31 (51), PCWP 25, CO/CI 7.69/3.26. PVR 3.25 WU-->Sev PAH, likely primarily PV HTN.   Synovitis of finger 06/11/2019   Trigger finger, unspecified little finger 12/23/2018   Type 2 diabetes mellitus with hyperglycemia, with long-term current use of insulin 09/09/2014   Ulcer 05/2016   Left shin   Wears glasses      PSH:   Past Surgical History:  Procedure Laterality Date   BARIATRIC SURGERY     CARDIAC CATHETERIZATION     [place]/[facility]   CATARACT EXTRACTION W/PHACO Right 10/29/2017   Procedure: CATARACT EXTRACTION PHACO AND INTRAOCULAR LENS PLACEMENT ([place] RIGHT DIABETIC;  Surgeon: BrLeandrew KoyanagiMD;  [place] Service: Ophthalmology;  Laterality: Right;  Diabetic - insulin   CATARACT EXTRACTION W/PHACO Left 11/18/2017   Procedure: CATARACT EXTRACTION PHACO AND INTRAOCULAR LENS PLACEMENT ([place] IVA/TOPICAL;  Surgeon: BrLeandrew KoyanagiMD;  [place]: Ophthalmology;  Laterality: Left;  DIABETES - insulin sleep apnea   COLONOSCOPY WITH PROPOFOL N/A 04/22/2013   Procedure: COLONOSCOPY WITH PROPOFOL;  Surgeon: DaMilus BanisterMD;  [facility];  Service: Endoscopy;  Laterality: N/A;   CORONARY ARTERY BYPASS GRAFT  2005   X 4   DG ANGIO AV SHUNT*L*     right and left upper arms   FASCIOTOMY  03/03/2012   Procedure: FASCIOTOMY;  Surgeon: GaWynonia SoursMD;  [place] [facility]: Orthopedics;  Laterality: Right;  FASCIOTOMY RIGHT SMALL FINGER   FASCIOTOMY Left 08/17/2013   Procedure: FASCIOTOMY LEFT RING;  Surgeon: GaWynonia SoursMD;  [place] [facility] Service: Orthopedics;  Laterality: Left;   INCISION AND DRAINAGE ABSCESS Left 10/15/2015   Procedure: INCISION AND DRAINAGE ABSCESS;  Surgeon: DiJules HusbandsMD;  Location: [facility] ORS;  Service: General;  Laterality: Left;   KIDNEY TRANSPLANT  09/13/2010   [facility] 11/15/2016   Procedure: RIGHT HEART CATH;  Surgeon: ArWellington HampshireMD;  [place] [facility];  Service: Cardiovascular;  Laterality: N/A;   RIGHT HEART CATH N/A 05/03/2019   Procedure: RIGHT HEART CATH;  Surgeon: McLarey DresserMD;  [place] [facility];  Service: Cardiovascular;  Laterality:  N/A;   TYMPANIC MEMBRANE REPAIR  1/12   left   VASECTOMY      Allergies:  Allergies  Allergen Reactions   Contrast Media [Iodinated Contrast Media] Other (See Comments)    Kidney transplant   Ibuprofen Other (See Comments)  Due to kidney transplant Due to kidney transplant Due to kidney transplant    Medications:   Prior to Admission medications   Medication Sig Start Date End Date Taking? Authorizing Provider  albuterol (PROVENTIL) (2.5 MG/3ML) 0.083% nebulizer solution Take 3 mLs (2.5 mg total) by nebulization every 6 (six) hours as needed for wheezing or shortness of breath. 01/02/21   Michela Pitcher, NP  albuterol (VENTOLIN HFA) 108 (90 Base) MCG/ACT inhaler Inhale 2 puffs into the lungs every 6 (six) hours as needed for wheezing or shortness of breath. 10/17/20   Martyn Ehrich, NP  amoxicillin (AMOXIL) 500 MG capsule Take 4 capsules (2,000 mg total) by mouth as needed (Take 1 hr before dental appointment.). 07/06/21     apixaban (ELIQUIS) 5 MG TABS tablet TAKE 1 TABLET BY MOUTH TWICE DAILY 01/25/21 01/25/22  [name], MD  beclomethasone (QVAR) 80 MCG/ACT inhaler Inhale 2 puffs into the lungs 2 (two) times daily. 10/17/20   Martyn Ehrich, NP  buPROPion [facility] SR) 150 MG 12 hr tablet TAKE 1 TABLET BY MOUTH TWICE DAILY 05/29/21 05/29/22  Dutch Quint B, FNP  cephALEXin (KEFLEX) 500 MG capsule Take 1 capsule (500 mg total) by mouth 3 (three) times daily. 06/28/21   Copland, Frederico Hamman, MD  cholecalciferol (VITAMIN D3) 25 MCG (1000 UNIT) tablet Take 1,000 Units by mouth daily.    [provider]  colchicine 0.6 MG tablet Take 1 tablet (0.6 mg total) by mouth every other day 06/11/21     Continuous Blood Gluc Sensor (FREESTYLE LIBRE 3 SENSOR) MISC Use one every 2 weeks. 05/28/21     Cyanocobalamin (B-12 PO) Take by mouth daily.    [provider]  dapagliflozin propanediol (FARXIGA) 10 MG TABS tablet Take 1 tablet (10 mg total) by mouth daily before  breakfast. 08/01/21   Milford, Maricela Bo, FNP  Febuxostat 80 MG TABS Take 1 tablet (80 mg total) by mouth once daily for 180 days 02/23/21     fluticasone (FLONASE) 50 MCG/ACT nasal spray PLACE 2 SPRAYS INTO BOTH NOSTRILS DAILY. 08/03/21 08/03/22  [name], MD  gabapentin (NEURONTIN) 300 MG capsule TAKE 1 CAPSULE BY MOUTH TWICE DAILY 05/22/21     insulin glargine-yfgn (SEMGLEE) 100 UNIT/ML Pen Inject 40 Units subcutaneously once daily 05/28/21     Insulin Pen Needle (UNIFINE PENTIPS) 31G X 5 MM MISC Use 4 times daily as directed 05/28/21     iron polysaccharides (FERREX 150) 150 MG capsule TAKE 1 CAPSULE BY MOUTH 2 TIMES DAILY. 06/22/21   [name], MD  losartan (COZAAR) 25 MG tablet Take 1 tablet (25 mg total) by mouth daily. 08/01/21   Rafael Bihari, FNP  metolazone (ZAROXOLYN) 2.5 MG tablet Take 1 tablet by mouth 2 days per week 08/01/21   Rafael Bihari, FNP  montelukast (SINGULAIR) 10 MG tablet TAKE 1 TABLET BY MOUTH AT BEDTIME. 10/17/20 10/17/21  Martyn Ehrich, NP  Multiple Vitamin (MULTIVITAMIN WITH MINERALS) TABS tablet Take 1 tablet by mouth daily.    [provider]  mycophenolate (MYFORTIC) 180 MG EC tablet Take 360 mg by mouth 2 (two) times daily.    [provider]  omeprazole (PRILOSEC) 20 MG capsule TAKE 1 CAPSULE BY MOUTH DAILY. 12/28/20 12/28/21  [name], MD  Potassium Chloride CR (MICRO-K) 8 MEQ CPCR capsule CR Take 10 capsules (80 mEq total) by mouth 2 (two) times daily. 08/01/21   Milford, Maricela Bo, FNP  predniSONE (DELTASONE) 5 MG  tablet Take 1 tablet (5 mg total) by mouth daily. 07/25/21     rosuvastatin (CRESTOR) 10 MG tablet Take 1 tablet (10 mg total) by mouth daily. 07/25/21   [name], MD  spironolactone (ALDACTONE) 25 MG tablet Take 1 tablet (25 mg total) by mouth daily. 08/01/21   Milford, Maricela Bo, FNP  sulfamethoxazole-trimethoprim (BACTRIM) 400-80 MG tablet Take 1 tablet by mouth 3 (three) times a week. Monday, Wednesday,  Friday    [provider]  tacrolimus (PROGRAF) 1 MG capsule Take 2 capsules (2 mg total) by mouth 2 times daily. 04/30/21     tadalafil, PAH, (ADCIRCA) 20 MG tablet TAKE 1 TABLET (20 MG TOTAL) BY MOUTH EVERY OTHER DAY. 12/01/20 12/01/21  [name], MD  tamsulosin (FLOMAX) 0.4 MG CAPS capsule TAKE 1 CAPSULE BY MOUTH DAILY. 05/28/21     torsemide (DEMADEX) 100 MG tablet Take 1 tablet (100 mg total) by mouth 2 (two) times daily. 08/01/21   Milford, Maricela Bo, FNP  vitamin C (ASCORBIC ACID) 250 MG tablet Take 1 tablet (250 mg total) by mouth daily. 07/04/19   Max Sane, MD  zinc sulfate 220 (50 Zn) MG capsule Take 220 mg by mouth daily.    [provider]    Discontinued Meds:   Medications Discontinued During This Encounter  Medication Reason   sodium zirconium cyclosilicate (LOKELMA) packet 10 g     Social History:  reports that he quit smoking about 26 years ago. His smoking use included cigars. He has never used smokeless tobacco. He reports current alcohol use. He reports that he does not use drugs.  Family History:   Family History  Problem Relation Age of Onset   Heart disease Father    Kidney failure Father    Kidney disease Father    Diabetes Maternal Grandmother    Breast cancer Maternal Grandmother    Valvular heart disease Mother    Liver cancer Paternal Uncle    Liver cancer Paternal Grandmother    Prostate cancer Neg Hx     Blood pressure (!) 150/80, pulse 60, temperature 98.5 F (36.9 C), temperature source Oral, resp. rate 18, height _0  (1.676 m), weight 124.3 kg, SpO2 94 %. General appearance: alert, cooperative, and appears stated age Head: Normocephalic, without obvious abnormality, atraumatic Eyes: negative Neck: no adenopathy, no carotid bruit, no JVD, supple, symmetrical, trachea midline, and thyroid not enlarged, symmetric, no tenderness/mass/nodules Back: symmetric, no curvature. ROM normal. No CVA tenderness. Resp: clear to auscultation  bilaterally Cardio: irregularly irregular rhythm GI: soft, non-tender; bowel sounds normal; no masses,  no organomegaly and no tenderness over the transplant kidney site in the RLQ Extremities: edema 1+ Pulses: 2+ and symmetric Skin: Skin color, texture, turgor normal. No rashes or lesions Access: lt BCF with "puffy" bruit   Massiah Longanecker, Hunt Oris, MD 08/15/2021, 4:20 PM

## 2021-08-15 NOTE — Consult Note (Signed)
CARDIOLOGY CONSULT NOTE       Patient ID: [name]. MRN: [id number] DOB/AGE: 24-Nov-1960 61 y.o.  Admit date: 08/15/2021 Referring Physician: Lorin Mercy Primary Physician: [name], MD Primary Cardiologist: Arida/Mclean Reason for Consultation: Bradycardia/Hyperkalemia/CHF  Principal Problem:   Hyperkalemia, diminished renal excretion   HPI:  61 y.o. admitted with 4 days of decreased urine output abdominal bloating and edema. History of distant renal transplant 2012. Has fistula in LUE never used Baseline Cr seems to be around 1.8 History of ischemic DCM with CABG EF 45-50% by TTE 05/03/19 Last myovue 03/09/21 low risk no ischemia small inferior apical infarct  He is in chronic afib on renally dose eliquis Had miscommunication with CHF clinic and taking much more potassium than usual. K was 3.2 on 08/01/21 with Cr 2.1 In ER K 6.0 and Cr 2.23 with BUN 51 He has received Lokelma, insulin and albuterol along with iv lasix.   Telemetry shows no excess bradycardia in afib rates 60-78 bpm QRS is 142 msec which is near his baseline with afib and LBBB also chronic   He has had no chest pain  troponin flat at 35   Despite mild/mod LV dysfunction he has had severe RV dysfunction and pulmonary HTN with PA pressure 78/31 mmHg by right heart cath 05/03/19   ROS All other systems reviewed and negative except as noted above  Past Medical History:  Diagnosis Date   Asthma, persistent controlled 02/25/2013   Attention or concentration deficit 04/26/2021   BPH with obstruction/lower urinary tract symptoms 09/25/2014   CAD (coronary artery disease) 2005   a.  Status post four-vessel CABG in 2005; b. 03/2018 MV: Small, fixed inferoapical and apical septal defect. No ischemia. EF 47% (60-65% by 05/2018 Echo).   Cellulitis and abscess of left leg 07/22/2020   Chronic atrial fibrillation    a. CHADS2VASc 4 (CHF, HTN, DM, vascular disease)--> Eliquis; b. 09/2018 Zio: Chronic Afib.   Chronic  combined systolic and diastolic CHF (congestive heart failure)    a.  7/18 Echo: EF 45-50%; b. 05/2018 Echo: EF 60-65%, RVSP 74.8; c. 12/2018 Echo: EF 50-55%. Sev dil LA; d. 04/2019 Echo: EF 45-50%, glob HK. Mod reduced RV fxn. RVSP 53.17mHg.   Chronic venous stasis dermatitis of both lower extremities    Cytomegaloviral disease 2017   Diverticulosis of colon 04/22/2013   Essential hypertension 12/17/2010   GERD (gastroesophageal reflux disease)    Gout attack 03/30/2018   History of renal transplant 03/01/2013   Hyperlipidemia    low since renal failure   Hypogonadism in male 09/25/2014   Major depressive disorder 10/04/2013   Obesity hypoventilation syndrome 03/31/2012   Obstructive sleep apnea 09/29/2012   NPSG [place] 03/15/12- AHI 57.9/ hr, desaturation to 78.8%, body weight 310 lbs   PAH (pulmonary artery hypertension)    a. 05/2018 Echo: RVSP 74.89mg; b. 12/2018 RV not well visualized; c. 04/2019 RHC: RA 19, RV 80/20, PA 78/31 (51), PCWP 25, CO/CI 7.69/3.26. PVR 3.25 WU-->Sev PAH, likely primarily PV HTN.   Synovitis of finger 06/11/2019   Trigger finger, unspecified little finger 12/23/2018   Type 2 diabetes mellitus with hyperglycemia, with long-term current use of insulin 09/09/2014   Ulcer 05/2016   Left shin   Wears glasses     Family History  Problem Relation Age of Onset   Heart disease Father    Kidney failure Father    Kidney disease Father    Diabetes Maternal Grandmother    Breast cancer  Maternal Grandmother    Valvular heart disease Mother    Liver cancer Paternal Uncle    Liver cancer Paternal Grandmother    Prostate cancer Neg Hx     Social History   Socioeconomic History   Marital status: Married    Spouse name: Not on file   Number of children: 2   Years of education: 14   Highest education level: Associate degree: academic program  Occupational History   Occupation: Warden/ranger: unemployed    Comment: disabled due to  kidney failure  Tobacco Use   Smoking status: Former    Types: Cigars    Quit date: 09/02/1994    Years since quitting: 26.9   Smokeless tobacco: Never   Tobacco comments:    Occasional, rare cigars  Vaping Use   Vaping Use: Never used  Substance and Sexual Activity   Alcohol use: Yes    Alcohol/week: 0.0 - 1.0 standard drinks    Comment: occasional - 12-pack lasts a month   Drug use: No   Sexual activity: Not on file  Other Topics Concern   Not on file  Social History Narrative   In school for culinary arts      Has living will   Wife is health care POA   Would accept resuscitation attempts   Hasn't considered tube feedings   Social Determinants of Health   Financial Resource Strain: Not on file  Food Insecurity: Not on file  Transportation Needs: Not on file  Physical Activity: Not on file  Stress: Not on file  Social Connections: Not on file  Intimate Partner Violence: Not on file    Past Surgical History:  Procedure Laterality Date   BARIATRIC [place]  05/03/2019   CATARACT EXTRACTION W/PHACO Right 10/29/2017   Procedure: CATARACT EXTRACTION PHACO AND INTRAOCULAR LENS PLACEMENT ([name])  RIGHT DIABETIC;  Surgeon: [name], MD;  Location: [place];  Service: Ophthalmology;  Laterality: Right;  Diabetic - insulin   CATARACT EXTRACTION W/PHACO Left 11/18/2017   Procedure: CATARACT EXTRACTION PHACO AND INTRAOCULAR LENS PLACEMENT ([place]) LEFT IVA/TOPICAL;  Surgeon: [name], MD;  Location: [place];  Service: Ophthalmology;  Laterality: Left;  DIABETES - insulin sleep apnea   COLONOSCOPY WITH PROPOFOL N/A 04/22/2013   Procedure: COLONOSCOPY WITH PROPOFOL;  Surgeon: [name], MD;  Location: [facility];  Service: Endoscopy;  Laterality: N/A;   CORONARY ARTERY BYPASS GRAFT  2005   X 4   DG ANGIO AV SHUNT*L*     right and left upper arms   FASCIOTOMY  03/03/2012    Procedure: FASCIOTOMY;  Surgeon: [name], MD;  Location: [place];  Service: Orthopedics;  Laterality: Right;  FASCIOTOMY RIGHT SMALL FINGER   FASCIOTOMY Left 08/17/2013   Procedure: FASCIOTOMY LEFT RING;  Surgeon: [name], MD;  Location: [place];  Service: Orthopedics;  Laterality: Left;   INCISION AND DRAINAGE ABSCESS Left 10/15/2015   Procedure: INCISION AND DRAINAGE ABSCESS;  Surgeon: [name], MD;  Location: [facility];  Service: General;  Laterality: Left;   KIDNEY TRANSPLANT  09/13/2010   cadaver--at [facility] HEART CATH N/A 11/15/2016   Procedure: RIGHT HEART CATH;  Surgeon: [name], MD;  Location: [place] [facility];  Service: Cardiovascular;  Laterality: N/A;   RIGHT HEART CATH N/A 05/03/2019   Procedure: RIGHT  HEART CATH;  Surgeon: [name], MD;  Location: [place] [facility];  Service: Cardiovascular;  Laterality: N/A;   TYMPANIC MEMBRANE REPAIR  1/12   left   VASECTOMY        Current Facility-Administered Medications:    acetaminophen (TYLENOL) tablet 650 mg, 650 mg, Oral, Q6H PRN **OR** acetaminophen (TYLENOL) suppository 650 mg, 650 mg, Rectal, Q6H PRN, Karmen Bongo, MD   albuterol (VENTOLIN HFA) 108 (90 Base) MCG/ACT inhaler 2 puff, 2 puff, Inhalation, Q2H PRN, Godfrey Pick, MD   calcium carbonate (dosed in mg elemental calcium) suspension 500 mg of elemental calcium, 500 mg of elemental calcium, Oral, Q6H PRN, Karmen Bongo, MD   camphor-menthol [facility]) lotion 1 application., 1 application., Topical, Q8H PRN **AND** hydrOXYzine (ATARAX) tablet 25 mg, 25 mg, Oral, Q8H PRN, Karmen Bongo, MD   docusate sodium (ENEMEEZ) enema 283 mg, 1 enema, Rectal, PRN, Karmen Bongo, MD   enoxaparin (LOVENOX) injection 30 mg, 30 mg, Subcutaneous, Q24H, Karmen Bongo, MD   feeding supplement (NEPRO CARB STEADY) liquid 237 mL, 237 mL, Oral, TID PRN, Karmen Bongo, MD   furosemide (LASIX) injection 40 mg, 40 mg,  Intravenous, BID, Karmen Bongo, MD   hydrALAZINE (APRESOLINE) injection 5 mg, 5 mg, Intravenous, Q4H PRN, Karmen Bongo, MD   insulin aspart (novoLOG) injection 0-15 Units, 0-15 Units, Subcutaneous, TID WC, Karmen Bongo, MD   insulin aspart (novoLOG) injection 0-5 Units, 0-5 Units, Subcutaneous, QHS, Karmen Bongo, MD   ondansetron [facility]) tablet 4 mg, 4 mg, Oral, Q6H PRN **OR** ondansetron (ZOFRAN) injection 4 mg, 4 mg, Intravenous, Q6H PRN, Karmen Bongo, MD   sodium chloride flush (NS) 0.9 % injection 3 mL, 3 mL, Intravenous, Q12H, Karmen Bongo, MD   sorbitol 70 % solution 30 mL, 30 mL, Oral, PRN, Karmen Bongo, MD   zolpidem (AMBIEN) tablet 5 mg, 5 mg, Oral, QHS PRN, Karmen Bongo, MD  Current Outpatient Medications:    albuterol (PROVENTIL) (2.5 MG/3ML) 0.083% nebulizer solution, Take 3 mLs (2.5 mg total) by nebulization every 6 (six) hours as needed for wheezing or shortness of breath., Disp: 150 mL, Rfl: 0   albuterol (VENTOLIN HFA) 108 (90 Base) MCG/ACT inhaler, Inhale 2 puffs into the lungs every 6 (six) hours as needed for wheezing or shortness of breath., Disp: 18 g, Rfl: 5   amoxicillin (AMOXIL) 500 MG capsule, Take 4 capsules (2,000 mg total) by mouth as needed (Take 1 hr before dental appointment.)., Disp: 4 capsule, Rfl: 5   apixaban (ELIQUIS) 5 MG TABS tablet, TAKE 1 TABLET BY MOUTH TWICE DAILY, Disp: 180 tablet, Rfl: 1   beclomethasone (QVAR) 80 MCG/ACT inhaler, Inhale 2 puffs into the lungs 2 (two) times daily., Disp: 10.6 g, Rfl: 5   buPROPion (WELLBUTRIN SR) 150 MG 12 hr tablet, TAKE 1 TABLET BY MOUTH TWICE DAILY, Disp: 180 tablet, Rfl: 3   cephALEXin (KEFLEX) 500 MG capsule, Take 1 capsule (500 mg total) by mouth 3 (three) times daily., Disp: 30 capsule, Rfl: 0   cholecalciferol (VITAMIN D3) 25 MCG (1000 UNIT) tablet, Take 1,000 Units by mouth daily., Disp: , Rfl:    colchicine 0.6 MG tablet, Take 1 tablet (0.6 mg total) by mouth every other day, Disp: 30  tablet, Rfl: 1   Continuous Blood Gluc Sensor (FREESTYLE LIBRE 3 SENSOR) MISC, Use one every 2 weeks., Disp: 6 each, Rfl: 3   Cyanocobalamin (B-12 PO), Take by mouth daily., Disp: , Rfl:    dapagliflozin propanediol (FARXIGA) 10 MG TABS tablet, Take 1  tablet (10 mg total) by mouth daily before breakfast., Disp: 90 tablet, Rfl: 3   Febuxostat 80 MG TABS, Take 1 tablet (80 mg total) by mouth once daily for 180 days, Disp: 90 tablet, Rfl: 1   fluticasone (FLONASE) 50 MCG/ACT nasal spray, PLACE 2 SPRAYS INTO BOTH NOSTRILS DAILY., Disp: 16 g, Rfl: 11   gabapentin (NEURONTIN) 300 MG capsule, TAKE 1 CAPSULE BY MOUTH TWICE DAILY, Disp: 180 capsule, Rfl: 0   insulin glargine-yfgn (SEMGLEE) 100 UNIT/ML Pen, Inject 40 Units subcutaneously once daily, Disp: 45 mL, Rfl: 3   Insulin Pen Needle (UNIFINE PENTIPS) 31G X 5 MM MISC, Use 4 times daily as directed, Disp: 400 each, Rfl: 3   iron polysaccharides (FERREX 150) 150 MG capsule, TAKE 1 CAPSULE BY MOUTH 2 TIMES DAILY., Disp: 180 capsule, Rfl: 1   losartan (COZAAR) 25 MG tablet, Take 1 tablet (25 mg total) by mouth daily., Disp: 90 tablet, Rfl: 3   metolazone (ZAROXOLYN) 2.5 MG tablet, Take 1 tablet by mouth 2 days per week, Disp: 10 tablet, Rfl: 6   montelukast (SINGULAIR) 10 MG tablet, TAKE 1 TABLET BY MOUTH AT BEDTIME., Disp: 90 tablet, Rfl: 3   Multiple Vitamin (MULTIVITAMIN WITH MINERALS) TABS tablet, Take 1 tablet by mouth daily., Disp: , Rfl:    mycophenolate (MYFORTIC) 180 MG EC tablet, Take 360 mg by mouth 2 (two) times daily., Disp: , Rfl:    omeprazole (PRILOSEC) 20 MG capsule, TAKE 1 CAPSULE BY MOUTH DAILY., Disp: 90 capsule, Rfl: 3   Potassium Chloride CR (MICRO-K) 8 MEQ CPCR capsule CR, Take 10 capsules (80 mEq total) by mouth 2 (two) times daily., Disp: 1800 capsule, Rfl: 3   predniSONE (DELTASONE) 5 MG tablet, Take 1 tablet (5 mg total) by mouth daily., Disp: 90 tablet, Rfl: 3   rosuvastatin (CRESTOR) 10 MG tablet, Take 1 tablet (10 mg total) by  mouth daily., Disp: 90 tablet, Rfl: 3   spironolactone (ALDACTONE) 25 MG tablet, Take 1 tablet (25 mg total) by mouth daily., Disp: 90 tablet, Rfl: 3   sulfamethoxazole-trimethoprim (BACTRIM) 400-80 MG tablet, Take 1 tablet by mouth 3 (three) times a week. Monday, Wednesday, Friday, Disp: , Rfl:    tacrolimus (PROGRAF) 1 MG capsule, Take 2 capsules (2 mg total) by mouth 2 times daily., Disp: 120 capsule, Rfl: 5   tadalafil, PAH, (ADCIRCA) 20 MG tablet, TAKE 1 TABLET (20 MG TOTAL) BY MOUTH EVERY OTHER DAY., Disp: 60 tablet, Rfl: 1   tamsulosin (FLOMAX) 0.4 MG CAPS capsule, TAKE 1 CAPSULE BY MOUTH DAILY., Disp: 90 capsule, Rfl: 3   torsemide (DEMADEX) 100 MG tablet, Take 1 tablet (100 mg total) by mouth 2 (two) times daily., Disp: 180 tablet, Rfl: 3   vitamin C (ASCORBIC ACID) 250 MG tablet, Take 1 tablet (250 mg total) by mouth daily., Disp: 30 tablet, Rfl: 0   zinc sulfate 220 (50 Zn) MG capsule, Take 220 mg by mouth daily., Disp: , Rfl:   enoxaparin (LOVENOX) injection  30 mg Subcutaneous Q24H   furosemide  40 mg Intravenous BID   insulin aspart  0-15 Units Subcutaneous TID WC   insulin aspart  0-5 Units Subcutaneous QHS   sodium chloride flush  3 mL Intravenous Q12H     Physical Exam: Blood pressure (!) 149/85, pulse 75, temperature 98.5 F (36.9 C), temperature source Oral, resp. rate 15, height _0  (1.676 m), weight 124.3 kg, SpO2 96 %.    Overweight Cushingoid Male  No distress  Lungs clear  Fistula LUE pulsatile no thrill Distant heart sounds JVP elevated with V wave Transplant kidney in RLQ Chronic venous stasis changes with bruising in  Both legs plus 2 edema  Left radial harvest site   Labs:   Lab Results  Component Value Date   WBC 4.5 08/15/2021   HGB 13.6 08/15/2021   HCT 40.0 08/15/2021   MCV 87.4 08/15/2021   PLT 121 (L) 08/15/2021    Recent Labs  Lab 08/15/21 1159 08/15/21 1412  NA 142 143  K 6.0* 7.2*  CL 107 109  CO2 29  --   BUN 51* 51*   CREATININE 2.23* 2.40*  CALCIUM 9.2  --   PROT 7.4  --   BILITOT 0.9  --   ALKPHOS 133*  --   ALT 18  --   AST 30  --   GLUCOSE 71 131*   Lab Results  Component Value Date   CKTOTAL 55 05/14/2020   CKMB 2.2 05/10/2012   TROPONINI <0.03 11/14/2016    Lab Results  Component Value Date   CHOL 88 05/07/2021   CHOL 89 10/09/2017   CHOL 92 03/06/2016   Lab Results  Component Value Date   HDL 36 (L) 05/07/2021   HDL 34 (L) 03/06/2016   HDL 38 (L) 03/07/2015   Lab Results  Component Value Date   LDLCALC 31 05/07/2021   LDLCALC 49 10/09/2017   LDLCALC 43 03/06/2016   Lab Results  Component Value Date   TRIG 107 05/07/2021   TRIG 73 03/06/2016   TRIG 88 03/07/2015   Lab Results  Component Value Date   CHOLHDL 2.4 05/07/2021   CHOLHDL 2.7 03/06/2016   CHOLHDL 4.2 03/07/2015   Lab Results  Component Value Date   LDLDIRECT 41.9 04/20/2012      Radiology: DG Chest 2 View  Result Date: 08/15/2021 CLINICAL DATA:  Short of breath EXAM: CHEST - 2 VIEW COMPARISON:  07/12/2019 FINDINGS: Prior CABG. Cardiac enlargement. Mild vascular congestion. Negative for pulmonary edema or pleural effusion. Negative for pneumonia. Calcified right paratracheal lymph node unchanged. IMPRESSION: Cardiac enlargement with mild vascular congestion. Negative for edema or effusion. Electronically Signed   By: Franchot Gallo M.D.   On: 08/15/2021 11:42    EKG: Afib LBBB    ASSESSMENT AND PLAN:   CHF:  concern more RV failure than LV with azotemia on higher dose diuretics and hyperkalemia BNP only 259 Nephrology seen and written for iv lasix 40 mg bid. Hold K.  Hold losartan, aldactone , Micro K Suspect he would benefit from repeat right heart cath and possibly coox to manage more right heart failure with azotemia CHF team will follow in am  Afib: chronic on renal dose eliquis rates ok and QRS LBBB not wider in setting of hyperkalemia CAD/CABG no angina ECG LBBB flat troponin 35 low risk myovue  03/09/21 Pulmonary HTN:  see above regarding right heart cath , update echo and continue Adcirca Who class 2/3  CRF:  nephrology seeing post transplant in 2012 on Prograf and Myfortic and low dose prednisone  Signed: Jenkins Rouge 08/15/2021, 5:11 PM

## 2021-08-15 NOTE — ED Notes (Signed)
Pt reports his CBG is 75 and that he has not eating. Pt given sandwich.

## 2021-08-15 NOTE — ED Triage Notes (Signed)
Pt arrived POV from home c/o fluid overload. Pt states I am SHOB and my stomach is hard and distended. Pt denies any pain.

## 2021-08-15 NOTE — H&P (Signed)
History and Physical    Patient: [name]. LPF:[id number] DOB: [birth year] DOA: 08/15/2021 DOS: the patient was seen and examined on 08/15/2021 PCP: [name], MD  Patient coming from: Home - Lives with wife; NOK: Wife, Jeremih Dearmas, [phone]   Chief Complaint: SOB  HPI: [name]. is a 61 y.o. male with medical history significant of asthma; BPH;  CAD s/p CABG; afib; chronic combined CHF; stasis dermatitis; s/p renal transplant (2012); HTN; HLD; morbid obesity with OHS/OSA; pulm HTN; and DM presenting with SOB.  He reports that he is here with fluid overload.  He noticed his weight creeping up and he took metolazone yesterday and it didn't help so he took a toresemide.  He took some more torsemide and it still didn't seem to work.  +orthopnea.  His potassium was low and so they told him to "take a couple of pills more a day."  He started taking 4-5 extra a day and was planning to get blood work today. He usually walks 1.5 miles a per and yesterday he could barely make it 1/4 mile.  His abdomen usually gets bigger with fluid.  He couldn't sleep last night, felt like he was choking.  He went to his recliner and has been up since.  He called his CHF clinic this AM, out in the garden - his BP was elevated and she told him to come in.   He lives in [place] and they advised him to come to [facility].  He got here and got diarrhea.      ER Course:  Renal transplant in 2012.  CHF, followed by [facility].  Due for echo on 5/10, prior h/o EF 35%.  Recently increased diuretic.  On loads of K+ replacement and taking extra - K+ is 6.0, 7.2.  LE edema, abdominal swelling.  Renal function mildly worse than baseline.  QRS mildly widened.  Give Lokelma, insulin, dextrose, albuterol, Lasix.       Review of Systems: As mentioned in the history of present illness. All other systems reviewed and are negative. Past Medical History:  Diagnosis Date   Asthma, persistent controlled  02/25/2013   Attention or concentration deficit 04/26/2021   BPH with obstruction/lower urinary tract symptoms 09/25/2014   CAD (coronary artery disease) 2005   a.  Status post four-vessel CABG in 2005; b. 03/2018 MV: Small, fixed inferoapical and apical septal defect. No ischemia. EF 47% (60-65% by 05/2018 Echo).   Cellulitis and abscess of left leg 07/22/2020   Chronic atrial fibrillation    a. CHADS2VASc 4 (CHF, HTN, DM, vascular disease)--> Eliquis; b. 09/2018 Zio: Chronic Afib.   Chronic combined systolic and diastolic CHF (congestive heart failure)    a.  7/18 Echo: EF 45-50%; b. 05/2018 Echo: EF 60-65%, RVSP 74.8; c. 12/2018 Echo: EF 50-55%. Sev dil LA; d. 04/2019 Echo: EF 45-50%, glob HK. Mod reduced RV fxn. RVSP 53.61mHg.   Chronic venous stasis dermatitis of both lower extremities    Cytomegaloviral disease 2017   Diverticulosis of colon 04/22/2013   Essential hypertension 12/17/2010   GERD (gastroesophageal reflux disease)    Gout attack 03/30/2018   History of renal transplant 03/01/2013   Hyperlipidemia    low since renal failure   Hypogonadism in male 09/25/2014   Major depressive disorder 10/04/2013   Obesity hypoventilation syndrome 03/31/2012   Obstructive sleep apnea 09/29/2012   NPSG [place] 03/15/12- AHI 57.9/ hr, desaturation to 78.8%, body weight 310 lbs   PAH (pulmonary  artery hypertension)    a. 05/2018 Echo: RVSP 74.88mHg; b. 12/2018 RV not well visualized; c. 04/2019 RHC: RA 19, RV 80/20, PA 78/31 (51), PCWP 25, CO/CI 7.69/3.26. PVR 3.25 WU-->Sev PAH, likely primarily PV HTN.   Synovitis of finger 06/11/2019   Trigger finger, unspecified little finger 12/23/2018   Type 2 diabetes mellitus with hyperglycemia, with long-term current use of insulin 09/09/2014   Ulcer 05/2016   Left shin   Wears glasses    Past Surgical History:  Procedure Laterality Date   [name] 05/03[place]   CATARACT  EXTRACTION W/PHACO Right 10/29/2017   Procedure: CATARACT EXTRACTION PHACO AND INTRAOCULAR LENS PLACEMENT ([place]  RIGHT DIABETIC;  Surgeon: BLeandrew Koyanagi MD;  [place]  Service: Ophthalmology;  Laterality: Right;  Diabetic - insulin   CATARACT EXTRACTION W/PHACO Left 11/18/2017   Procedure: CATARACT EXTRACTION PHACO AND INTRAOCULAR LENS PLACEMENT ([place] LEFT IVA/TOPICAL;  Surgeon: BLeandrew Koyanagi MD;  [place]  Service: Ophthalmology;  Laterality: Left;  DIABETES - insulin sleep apnea   COLONOSCOPY WITH PROPOFOL N/A 04/22/2013   Procedure: COLONOSCOPY WITH PROPOFOL;  Surgeon: DMilus Banister MD;  [facility];  Service: Endoscopy;  Laterality: N/A;   CORONARY ARTERY BYPASS GRAFT  2005   X 4   DG ANGIO AV SHUNT*L*     right and left upper arms   FASCIOTOMY  03/03/2012   Procedure: FASCIOTOMY;  Surgeon: GWynonia Sours MD;  [place]  Service: Orthopedics;  Laterality: Right;  FASCIOTOMY RIGHT SMALL FINGER   FASCIOTOMY Left 08/17/2013   Procedure: FASCIOTOMY LEFT RING;  Surgeon: GWynonia Sours MD;  [place]  Service: Orthopedics;  Laterality: Left;   INCISION AND DRAINAGE ABSCESS Left 10/15/2015   Procedure: INCISION AND DRAINAGE ABSCESS;  Surgeon: DJules Husbands MD;  Location: [facility];  Service: General;  Laterality: Left;   KIDNEY TR[facility]PL[facility]  [facility]/13/2010   [facility] 11/15/2016   Procedure: RIGHT HEART CATH;  Surgeon: AWellington Hampshire MD;  [place] [facility];  Service: Cardiovascular;  Laterality: N/A;   RIGHT HEART CATH N/A 05/03/2019   Procedure: RIGHT HEART CATH;  Surgeon: MLarey Dresser MD;  [place] [facility] [facility];  Service: Cardiovascular;  Laterality: N/A;   TYMPANIC MEMBRANE REPAIR  1/12   left   VASECTOMY     Social History:  reports that he quit smoking about 26 years ago. His smoking use included cigars. He has never  used smokeless tobacco. He reports current alcohol use. He reports that he does not use drugs.  Allergies  Allergen Reactions   Contrast Media [Iodinated Contrast Media] Other (See Comments)    Kidney transplant   Ibuprofen Other (See Comments)    Due to kidney transplant Due to kidney transplant Due to kidney transplant    Family History  Problem Relation Age of Onset   Heart disease Father    Kidney failure Father    Kidney disease Father    Diabetes Maternal Grandmother    Breast cancer Maternal Grandmother    Valvular heart disease Mother    Liver cancer Paternal Uncle    Liver cancer Paternal Grandmother    Prostate cancer Neg Hx     Prior to Admission medications   Medication Sig Start Date End Date Taking? Authorizing Provider  albuterol (PROVENTIL) (  2.5 MG/3ML) 0.083% nebulizer solution Take 3 mLs (2.5 mg total) by nebulization every 6 (six) hours as needed for wheezing or shortness of breath. 01/02/21   Michela Pitcher, NP  albuterol (VENTOLIN HFA) 108 (90 Base) MCG/ACT inhaler Inhale 2 puffs into the lungs every 6 (six) hours as needed for wheezing or shortness of breath. 10/17/20   Martyn Ehrich, NP  amoxicillin (AMOXIL) 500 MG capsule Take 4 capsules (2,000 mg total) by mouth as needed (Take 1 hr before dental appointment.). 07/06/21     apixaban (ELIQUIS) 5 MG TABS tablet TAKE 1 TABLET BY MOUTH TWICE DAILY 01/25/21 01/25/22  [name], MD  beclomethasone (QVAR) 80 MCG/ACT inhaler Inhale 2 puffs into the lungs 2 (two) times daily. 10/17/20   Martyn Ehrich, NP  buPROPion [facility] SR) 150 MG 12 hr tablet TAKE 1 TABLET BY MOUTH TWICE DAILY 05/29/21 05/29/22  Dutch Quint B, FNP  cephALEXin (KEFLEX) 500 MG capsule Take 1 capsule (500 mg total) by mouth 3 (three) times daily. 06/28/21   Copland, Frederico Hamman, MD  cholecalciferol (VITAMIN D3) 25 MCG (1000 UNIT) tablet Take 1,000 Units by mouth daily.    [provider]  colchicine 0.6 MG tablet Take 1 tablet  (0.6 mg total) by mouth every other day 06/11/21     Continuous Blood Gluc Sensor (FREESTYLE LIBRE 3 SENSOR) MISC Use one every 2 weeks. 05/28/21     Cyanocobalamin (B-12 PO) Take by mouth daily.    [provider]  dapagliflozin propanediol (FARXIGA) 10 MG TABS tablet Take 1 tablet (10 mg total) by mouth daily before breakfast. 08/01/21   Milford, Maricela Bo, FNP  Febuxostat 80 MG TABS Take 1 tablet (80 mg total) by mouth once daily for 180 days 02/23/21     fluticasone (FLONASE) 50 MCG/ACT nasal spray PLACE 2 SPRAYS INTO BOTH NOSTRILS DAILY. 08/03/21 08/03/22  [name], MD  gabapentin (NEURONTIN) 300 MG capsule TAKE 1 CAPSULE BY MOUTH TWICE DAILY 05/22/21     insulin glargine-yfgn (SEMGLEE) 100 UNIT/ML Pen Inject 40 Units subcutaneously once daily 05/28/21     Insulin Pen Needle (UNIFINE PENTIPS) 31G X 5 MM MISC Use 4 times daily as directed 05/28/21     iron polysaccharides (FERREX 150) 150 MG capsule TAKE 1 CAPSULE BY MOUTH 2 TIMES DAILY. 06/22/21   [name], MD  losartan (COZAAR) 25 MG tablet Take 1 tablet (25 mg total) by mouth daily. 08/01/21   Rafael Bihari, FNP  metolazone (ZAROXOLYN) 2.5 MG tablet Take 1 tablet by mouth 2 days per week 08/01/21   Rafael Bihari, FNP  montelukast (SINGULAIR) 10 MG tablet TAKE 1 TABLET BY MOUTH AT BEDTIME. 10/17/20 10/17/21  Martyn Ehrich, NP  Multiple Vitamin (MULTIVITAMIN WITH MINERALS) TABS tablet Take 1 tablet by mouth daily.    [provider]  mycophenolate (MYFORTIC) 180 MG EC tablet Take 360 mg by mouth 2 (two) times daily.    [provider]  omeprazole (PRILOSEC) 20 MG capsule TAKE 1 CAPSULE BY MOUTH DAILY. 12/28/20 12/28/21  [name], MD  Potassium Chloride CR (MICRO-K) 8 MEQ CPCR capsule CR Take 10 capsules (80 mEq total) by mouth 2 (two) times daily. 08/01/21   Milford, Maricela Bo, FNP  predniSONE (DELTASONE) 5 MG tablet Take 1 tablet (5 mg total) by mouth daily. 07/25/21     rosuvastatin (CRESTOR) 10  MG tablet Take 1 tablet (10 mg total) by mouth daily. 07/25/21   [name],  MD  spironolactone (ALDACTONE) 25 MG tablet Take 1 tablet (25 mg total) by mouth daily. 08/01/21   Milford, Maricela Bo, FNP  sulfamethoxazole-trimethoprim (BACTRIM) 400-80 MG tablet Take 1 tablet by mouth 3 (three) times a week. Monday, Wednesday, Friday    [provider]  tacrolimus (PROGRAF) 1 MG capsule Take 2 capsules (2 mg total) by mouth 2 times daily. 04/30/21     tadalafil, PAH, (ADCIRCA) 20 MG tablet TAKE 1 TABLET (20 MG TOTAL) BY MOUTH EVERY OTHER DAY. 12/01/20 12/01/21  [name], MD  tamsulosin (FLOMAX) 0.4 MG CAPS capsule TAKE 1 CAPSULE BY MOUTH DAILY. 05/28/21     torsemide (DEMADEX) 100 MG tablet Take 1 tablet (100 mg total) by mouth 2 (two) times daily. 08/01/21   Milford, Maricela Bo, FNP  vitamin C (ASCORBIC ACID) 250 MG tablet Take 1 tablet (250 mg total) by mouth daily. 07/04/19   Max Sane, MD  zinc sulfate 220 (50 Zn) MG capsule Take 220 mg by mouth daily.    [provider]    Physical Exam: Vitals:   08/15/21 1410 08/15/21 1630 08/15/21 1757 08/15/21 1800  BP: (!) 150/80 (!) 149/85 (!) 160/94 (!) 160/94  Pulse: 60 75 68 74  Resp: 18 15 (!) 21 15  Temp:   97.8 F (36.6 C) 97.6 F (36.4 C)  TempSrc:   Oral Oral  SpO2: 94% 96% 93% 94%  Weight:      Height:       General:  Appears calm and comfortable and is in NAD, on RA Eyes:  PERRL, EOMI, normal lids, iris ENT:  grossly normal hearing, lips & tongue, mmm Neck:  no LAD, masses or thyromegaly Cardiovascular:  RRR, no m/r/g. 2-3+ LE edema.  Respiratory:   CTA bilaterally with no wheezes/rales/rhonchi.  Normal respiratory effort. Abdomen:  soft, NT, ND Skin:  no rash or induration seen on limited exam Musculoskeletal:  grossly normal tone BUE/BLE, good ROM, no bony abnormality Psychiatric:  grossly normal mood and affect, speech fluent and appropriate, AOx3 Neurologic:  CN 2-12 grossly intact, moves all extremities in  coordinated fashion   Radiological Exams on Admission: Independently reviewed - see discussion in A/P where applicable  DG Chest 2 View  Result Date: 08/15/2021 CLINICAL DATA:  Short of breath EXAM: CHEST - 2 VIEW COMPARISON:  07/12/2019 FINDINGS: Prior CABG. Cardiac enlargement. Mild vascular congestion. Negative for pulmonary edema or pleural effusion. Negative for pneumonia. Calcified right paratracheal lymph node unchanged. IMPRESSION: Cardiac enlargement with mild vascular congestion. Negative for edema or effusion. Electronically Signed   By: Franchot Gallo M.D.   On: 08/15/2021 11:42    EKG: Independently reviewed.  NSR with rate 73; IVCD; nonspecific ST changes with no evidence of acute ischemia   Labs on Admission: I have personally reviewed the available labs and imaging studies at the time of the admission.  Pertinent labs:    K+ 6.0, 7.2 BUN 51/Creatinine 2.23/GFR 33 - stable BNP 259.3; 228.3 on 5/10 HS troponin 35, 35 WBC 4.5 Platelets 121   Assessment and Plan: Principal Problem:   Acute on chronic systolic CHF (congestive heart failure) (HCC) Active Problems:   Hyperlipidemia   Asthma, persistent controlled   History of renal transplant   Major depressive disorder   Chronic atrial fibrillation   Essential hypertension   Type 2 diabetes mellitus with hyperglycemia, with long-term current use of insulin   Pulmonary hypertension due to left heart disease   Hyperkalemia, diminished renal excretion  Diarrhea   Chronic kidney disease, stage 3b (HCC)    Acute on chronic systolic CHF -Patient with known h/o chronic systolic CHF presenting with worsening SOB and edema -CXR consistent with vascular congestion -Stable BNP compared with prior -With symptoms and abnl CXR, acute decompensated CHF seems probable as diagnosis -Will admit, as per the Emergency HF Mortality Risk Grade.  The patient has:  severe electrolyte abnormalities -Will request echocardiogram -CHF  order set utilized -Cardiology consulted -Was given Lasix 40 mg x 1 in ER and will repeat with IV BID (dosing per renal/cards) -Continue [place] O2 prn for now -Mildly elevated HS troponin is likely related to demand ischemia; doubt ACS based on symptoms -Treatment with SGLT-2-inhibitors reduces CHF-associated hospitalizations, but will hold Farxiga in the acute setting -Hold home diuretics for now - spironolactone, torsemide, metolazone  Hyperkalemia -Patient was prescribed a very high dose of KCl (80 mEq BID) and then also taking additional doses -Markedly hyperkalemic on presentation -Was given Calcium gluconate, albuterol, lasix, insulin/glucose, IVF, and Lokelma in the ER -Will recheck BMP now and in the AM -Will admit to telemetry  Diarrhea -May be related to marked hyperkalemia on presentation -Will follow  Stage 3b CKD -Appears to be stable at this time -Nephrology is consulting -Will follow with diuresis  S/p renal transplant -Continue mycophenolate, prednisone, tacrolimus -Continue Bactrim for prophylaxis  HTN -Hold Cozaar for now -Will also add prn hydralazine  HLD -Continue Lipitor  DM -Last A1c was 5.2 -Continue semglee -Will cover with moderate-scale SSI for now -Continue Neurontin  Pulmonary HTN -Continue tadalafil  Afib -Continue Eliquis -He appears to be rate controlled without medication  CAD -s/p CABG  Asthma -Continue Albuterol, Qvar (budesonide per pharmacy), Singulair  Morbid obesity -Body mass index is 44.22 kg/m..  -Weight loss should be encouraged -Outpatient PCP/bariatric medicine/bariatric surgery f/u encouraged   Mood d/o -Continue bupropion  Gout -Continue colchicine, febuxostat     Advance Care Planning:   Code Status: Full Code   Consults: Nephrology; cardiology; CHF navigator; PT/OT; TOC team; nutrition  DVT Prophylaxis: Eliquis  Family Communication: None present; I attempted to call his wife the evening of  admission and was unable to reach her  Severity of Illness: The appropriate patient status for this patient is INPATIENT. Inpatient status is judged to be reasonable and necessary in order to provide the required intensity of service to ensure the patient's safety. The patient's presenting symptoms, physical exam findings, and initial radiographic and laboratory data in the context of their chronic comorbidities is felt to place them at high risk for further clinical deterioration. Furthermore, it is not anticipated that the patient will be medically stable for discharge from the hospital within 2 midnights of admission.   * I certify that at the point of admission it is my clinical judgment that the patient will require inpatient hospital care spanning beyond 2 midnights from the point of admission due to high intensity of service, high risk for further deterioration and high frequency of surveillance required.*  Author: Karmen Bongo, MD 08/15/2021 6:50 PM  For on call review [email or web address].

## 2021-08-15 NOTE — Progress Notes (Signed)
Patient arrived to 4e-23, vitals taken, patient denies pain.

## 2021-08-15 NOTE — ED Provider Notes (Addendum)
[facility] EMERGENCY DEPARTMENT Provider Note   CSN: [id number] Arrival date & time: 08/15/21  1104     History  Chief Complaint  Patient presents with   Shortness of Breath   stomach distention    [name]. is a 61 y.o. male.  Pt complains of shortness of breath, abdominal swelling and leg swelling.  Patient reports he had a renal transplant in 2012.  Patient reports his creatinine was increased 2 weeks ago when he had his labs evaluated.  Patient feels like he is more swollen and more short of breath.  Patient has been at the beach he does report eating some saltier food.  Patient has a history of congestive heart failure and is followed by cardiology.  Patient reports that the diuretic that he takes at home does not seem to be working.  The history is provided by the patient. No language interpreter was used.  Shortness of Breath Severity:  Moderate Onset quality:  Gradual Duration:  1 week Timing:  Constant Progression:  Worsening Chronicity:  New Context: activity   Relieved by:  Nothing Associated symptoms: wheezing   Associated symptoms: no abdominal pain and no fever       Home Medications Prior to Admission medications   Medication Sig Start Date End Date Taking? Authorizing Provider  albuterol (PROVENTIL) (2.5 MG/3ML) 0.083% nebulizer solution Take 3 mLs (2.5 mg total) by nebulization every 6 (six) hours as needed for wheezing or shortness of breath. 01/02/21   Michela Pitcher, NP  albuterol (VENTOLIN HFA) 108 (90 Base) MCG/ACT inhaler Inhale 2 puffs into the lungs every 6 (six) hours as needed for wheezing or shortness of breath. 10/17/20   Martyn Ehrich, NP  amoxicillin (AMOXIL) 500 MG capsule Take 4 capsules (2,000 mg total) by mouth as needed (Take 1 hr before dental appointment.). 07/06/21     apixaban (ELIQUIS) 5 MG TABS tablet TAKE 1 TABLET BY MOUTH TWICE DAILY 01/25/21 01/25/22  [name], MD  beclomethasone (QVAR)  80 MCG/ACT inhaler Inhale 2 puffs into the lungs 2 (two) times daily. 10/17/20   Martyn Ehrich, NP  buPROPion [facility] SR) 150 MG 12 hr tablet TAKE 1 TABLET BY MOUTH TWICE DAILY 05/29/21 05/29/22  Dutch Quint B, FNP  cephALEXin (KEFLEX) 500 MG capsule Take 1 capsule (500 mg total) by mouth 3 (three) times daily. 06/28/21   Copland, Frederico Hamman, MD  cholecalciferol (VITAMIN D3) 25 MCG (1000 UNIT) tablet Take 1,000 Units by mouth daily.    [provider]  colchicine 0.6 MG tablet Take 1 tablet (0.6 mg total) by mouth every other day 06/11/21     Continuous Blood Gluc Sensor (FREESTYLE LIBRE 3 SENSOR) MISC Use one every 2 weeks. 05/28/21     Cyanocobalamin (B-12 PO) Take by mouth daily.    [provider]  dapagliflozin propanediol (FARXIGA) 10 MG TABS tablet Take 1 tablet (10 mg total) by mouth daily before breakfast. 08/01/21   Milford, Maricela Bo, FNP  Febuxostat 80 MG TABS Take 1 tablet (80 mg total) by mouth once daily for 180 days 02/23/21     fluticasone (FLONASE) 50 MCG/ACT nasal spray PLACE 2 SPRAYS INTO BOTH NOSTRILS DAILY. 08/03/21 08/03/22  [name], MD  gabapentin (NEURONTIN) 300 MG capsule TAKE 1 CAPSULE BY MOUTH TWICE DAILY 05/22/21     insulin glargine-yfgn (SEMGLEE) 100 UNIT/ML Pen Inject 40 Units subcutaneously once daily 05/28/21     Insulin Pen Needle (UNIFINE PENTIPS) 31G  X 5 MM MISC Use 4 times daily as directed 05/28/21     iron polysaccharides (FERREX 150) 150 MG capsule TAKE 1 CAPSULE BY MOUTH 2 TIMES DAILY. 06/22/21   [name], MD  losartan (COZAAR) 25 MG tablet Take 1 tablet (25 mg total) by mouth daily. 08/01/21   Rafael Bihari, FNP  metolazone (ZAROXOLYN) 2.5 MG tablet Take 1 tablet by mouth 2 days per week 08/01/21   Rafael Bihari, FNP  montelukast (SINGULAIR) 10 MG tablet TAKE 1 TABLET BY MOUTH AT BEDTIME. 10/17/20 10/17/21  Martyn Ehrich, NP  Multiple Vitamin (MULTIVITAMIN WITH MINERALS) TABS tablet Take 1 tablet by mouth daily.     [provider]  mycophenolate (MYFORTIC) 180 MG EC tablet Take 360 mg by mouth 2 (two) times daily.    [provider]  omeprazole (PRILOSEC) 20 MG capsule TAKE 1 CAPSULE BY MOUTH DAILY. 12/28/20 12/28/21  [name], MD  Potassium Chloride CR (MICRO-K) 8 MEQ CPCR capsule CR Take 10 capsules (80 mEq total) by mouth 2 (two) times daily. 08/01/21   Milford, Maricela Bo, FNP  predniSONE (DELTASONE) 5 MG tablet Take 1 tablet (5 mg total) by mouth daily. 07/25/21     rosuvastatin (CRESTOR) 10 MG tablet Take 1 tablet (10 mg total) by mouth daily. 07/25/21   [name], MD  spironolactone (ALDACTONE) 25 MG tablet Take 1 tablet (25 mg total) by mouth daily. 08/01/21   Milford, Maricela Bo, FNP  sulfamethoxazole-trimethoprim (BACTRIM) 400-80 MG tablet Take 1 tablet by mouth 3 (three) times a week. Monday, Wednesday, Friday    [provider]  tacrolimus (PROGRAF) 1 MG capsule Take 2 capsules (2 mg total) by mouth 2 times daily. 04/30/21     tadalafil, PAH, (ADCIRCA) 20 MG tablet TAKE 1 TABLET (20 MG TOTAL) BY MOUTH EVERY OTHER DAY. 12/01/20 12/01/21  [name], MD  tamsulosin (FLOMAX) 0.4 MG CAPS capsule TAKE 1 CAPSULE BY MOUTH DAILY. 05/28/21     torsemide (DEMADEX) 100 MG tablet Take 1 tablet (100 mg total) by mouth 2 (two) times daily. 08/01/21   Milford, Maricela Bo, FNP  vitamin C (ASCORBIC ACID) 250 MG tablet Take 1 tablet (250 mg total) by mouth daily. 07/04/19   Max Sane, MD  zinc sulfate 220 (50 Zn) MG capsule Take 220 mg by mouth daily.    [provider]      Allergies    Contrast media [iodinated contrast media] and Ibuprofen    Review of Systems   Review of Systems  Constitutional:  Positive for fatigue. Negative for fever.  Respiratory:  Positive for shortness of breath and wheezing.   Gastrointestinal:  Negative for abdominal pain.  All other systems reviewed and are negative.  Physical Exam Updated Vital Signs BP (!) 150/80 (BP Location: Right  Arm)   Pulse 60   Temp 98.5 F (36.9 C) (Oral)   Resp 18   Ht _0  (1.676 m)   Wt 124.3 kg   SpO2 94%   BMI 44.22 kg/m  Physical Exam Vitals and nursing note reviewed.  Constitutional:      General: He is not in acute distress.    Appearance: He is well-developed.  HENT:     Head: Normocephalic and atraumatic.  Eyes:     Conjunctiva/sclera: Conjunctivae normal.  Cardiovascular:     Rate and Rhythm: Normal rate and regular rhythm.     Heart sounds: No murmur heard. Pulmonary:  Effort: Pulmonary effort is normal. No respiratory distress.     Breath sounds: Decreased breath sounds present.  Chest:     Chest wall: No deformity.  Abdominal:     Palpations: Abdomen is soft.     Tenderness: There is no abdominal tenderness.  Musculoskeletal:        General: No swelling.     Cervical back: Normal range of motion and neck supple.  Skin:    General: Skin is warm and dry.     Capillary Refill: Capillary refill takes less than 2 seconds.  Neurological:     General: No focal deficit present.     Mental Status: He is alert.  Psychiatric:        Mood and Affect: Mood normal.    ED Results / Procedures / Treatments   Labs (all labs ordered are listed, but only abnormal results are displayed) Labs Reviewed  CBC WITH DIFFERENTIAL/PLATELET - Abnormal; Notable for the following components:      Result Value   RDW 15.9 (*)    Platelets 121 (*)    All other components within normal limits  COMPREHENSIVE METABOLIC PANEL - Abnormal; Notable for the following components:   Potassium 6.0 (*)    BUN 51 (*)    Creatinine, Ser 2.23 (*)    Alkaline Phosphatase 133 (*)    GFR, Estimated 33 (*)    All other components within normal limits  BRAIN NATRIURETIC PEPTIDE - Abnormal; Notable for the following components:   B Natriuretic Peptide 259.3 (*)    All other components within normal limits  I-STAT CHEM 8, ED - Abnormal; Notable for the following components:   Potassium 7.2 (*)     BUN 51 (*)    Creatinine, Ser 2.40 (*)    Glucose, Bld 131 (*)    All other components within normal limits  TROPONIN I (HIGH SENSITIVITY) - Abnormal; Notable for the following components:   Troponin I (High Sensitivity) 35 (*)    All other components within normal limits  TROPONIN I (HIGH SENSITIVITY)    EKG EKG Interpretation  Date/Time:  Wednesday Aug 15 2021 11:22:06 EDT Ventricular Rate:  73 PR Interval:    QRS Duration: 142 QT Interval:  458 QTC Calculation: 504 R Axis:   135 Text Interpretation: Wide QRS rhythm with occasional Premature ventricular complexes Non-specific intra-ventricular conduction block Cannot rule out Anterior infarct , age undetermined Abnormal ECG Confirmed by Godfrey Pick [phone]) on 08/15/2021 2:18:09 PM  Radiology DG Chest 2 View  Result Date: 08/15/2021 CLINICAL DATA:  Short of breath EXAM: CHEST - 2 VIEW COMPARISON:  07/12/2019 FINDINGS: Prior CABG. Cardiac enlargement. Mild vascular congestion. Negative for pulmonary edema or pleural effusion. Negative for pneumonia. Calcified right paratracheal lymph node unchanged. IMPRESSION: Cardiac enlargement with mild vascular congestion. Negative for edema or effusion. Electronically Signed   By: Franchot Gallo M.D.   On: 08/15/2021 11:42    Procedures .Critical Care E&M Performed by: Fransico Meadow, PA-C  Critical care provider statement:    Critical care time (minutes): 45.   Critical care start time:  08/15/2021 12:30 PM   Critical care end time:  08/15/2021 2:55 PM   Critical care time was exclusive of:  Separately billable procedures and treating other patients and teaching time   Critical care was necessary to treat or prevent imminent or life-threatening deterioration of the following conditions:  Cardiac failure, circulatory failure, dehydration and renal failure   Critical care was time spent personally  by me on the following activities:  Development of treatment plan with patient or surrogate,  discussions with consultants, evaluation of patient's response to treatment, examination of patient, interpretation of cardiac output measurements, ordering and review of laboratory studies, ordering and review of radiographic studies, pulse oximetry, re-evaluation of patient's condition, review of old charts and transcutaneous pacing   Care discussed with: admitting provider   After initial E/M assessment, critical care services were subsequently performed that were exclusive of separately billable procedures or treatment.      Medications Ordered in ED Medications  albuterol (VENTOLIN HFA) 108 (90 Base) MCG/ACT inhaler 2 puff (has no administration in time range)  furosemide (LASIX) injection 40 mg (has no administration in time range)  sodium chloride 0.9 % bolus 500 mL (has no administration in time range)  sodium zirconium cyclosilicate (LOKELMA) packet 10 g (has no administration in time range)  calcium gluconate inj 10% (1 g) URGENT USE ONLY! (has no administration in time range)  albuterol (PROVENTIL) (2.5 MG/3ML) 0.083% nebulizer solution 10 mg (has no administration in time range)  insulin aspart (novoLOG) injection 5 Units (has no administration in time range)    And  dextrose 50 % solution 50 mL (has no administration in time range)    ED Course/ Medical Decision Making/ A&P                           Medical Decision Making Patient reports he had a renal transplant in 2012 patient complains of swelling to his abdomen and swelling to his legs.  Patient reports increasing shortness of breath.  Denies any chest pain. he states his abdomen is swollen but he is not having any abdominal pain  Amount and/or Complexity of Data Reviewed External Data Reviewed: notes.    Details: Cardiology notes reviewed patient has a history of congestive heart failure, both systolic and diastolic Labs: ordered. Decision-making details documented in ED Course.    Details: Labs ordered reviewed and  interpreted chemistries show a potassium of 6.0 BUN of 51 creatinine of 2.23 BNP is elevated at 259 troponin is slightly elevated at 35 Radiology: ordered and independent interpretation performed. Decision-making details documented in ED Course.    Details: Chest x-ray shows mild vascular congestion ECG/medicine tests: ordered and independent interpretation performed. Decision-making details documented in ED Course.    Details: EKG shows wide qrs nonspecific block. Discussion of management or test interpretation with external provider(s): I spoke with Dr. Augustin Coupe. Nephrology.  He will see and consult. Hospitalist consulted for admission   Risk OTC drugs. Prescription drug management. Risk Details: Patient has an elevated potassium of 6.0.  Hyperkalemia protocol initiated.  Patient given IV Lasix and Lokelma albuterol neb calcium gluconate, insulin and dextrose and insulin   Critical Care Total time providing critical care: 45 minutes Lasix 40 mg IV normal saline 500 mL bolus, Lokelma     Pt reports he is taking extra potassium.  He reports he has had frequent urination and thought potassium would help     Final Clinical Impression(s) / ED Diagnoses Final diagnoses:  Hyperkalemia  SOB (shortness of breath)  Abdominal swelling  Lower extremity edema    Rx / DC Orders ED Discharge Orders     None         Sidney Ace 08/15/21 1456    Sidney Ace 08/15/21 1500    Godfrey Pick, MD 08/18/21 1722

## 2021-08-15 NOTE — ED Notes (Signed)
CBG 120

## 2021-08-15 NOTE — Progress Notes (Signed)
Home CPAP set up and placed in reach to pt. Sterile water filled. Pt states he will place himself on when ready. RT will cont to monitor as needed.

## 2021-08-15 NOTE — ED Notes (Signed)
Lab results given to EDP.

## 2021-08-16 ENCOUNTER — Inpatient Hospital Stay (HOSPITAL_COMMUNITY): Payer: 59

## 2021-08-16 DIAGNOSIS — I5023 Acute on chronic systolic (congestive) heart failure: Secondary | ICD-10-CM

## 2021-08-16 LAB — CBC
HCT: 39.4 % (ref 39.0–52.0)
Hemoglobin: 12.5 g/dL — ABNORMAL LOW (ref 13.0–17.0)
MCH: 27.5 pg (ref 26.0–34.0)
MCHC: 31.7 g/dL (ref 30.0–36.0)
MCV: 86.8 fL (ref 80.0–100.0)
Platelets: 93 10*3/uL — ABNORMAL LOW (ref 150–400)
RBC: 4.54 MIL/uL (ref 4.22–5.81)
RDW: 16.1 % — ABNORMAL HIGH (ref 11.5–15.5)
WBC: 5.5 10*3/uL (ref 4.0–10.5)
nRBC: 0 % (ref 0.0–0.2)

## 2021-08-16 LAB — BASIC METABOLIC PANEL
Anion gap: 6 (ref 5–15)
BUN: 49 mg/dL — ABNORMAL HIGH (ref 8–23)
CO2: 27 mmol/L (ref 22–32)
Calcium: 9.5 mg/dL (ref 8.9–10.3)
Chloride: 107 mmol/L (ref 98–111)
Creatinine, Ser: 2.22 mg/dL — ABNORMAL HIGH (ref 0.61–1.24)
GFR, Estimated: 33 mL/min — ABNORMAL LOW (ref >60)
Glucose, Bld: 132 mg/dL — ABNORMAL HIGH (ref 70–99)
Potassium: 5 mmol/L (ref 3.5–5.1)
Sodium: 140 mmol/L (ref 135–145)

## 2021-08-16 LAB — ECHOCARDIOGRAM COMPLETE
AR max vel: 3.24 cm2
AV Peak grad: 12.9 mmHg
Ao pk vel: 1.8 m/s
Area-P 1/2: 5.23 cm2
Height: 66 in
S' Lateral: 4.8 cm
Weight: 4318.4 oz

## 2021-08-16 LAB — GLUCOSE, CAPILLARY
Glucose-Capillary: 100 mg/dL — ABNORMAL HIGH (ref 70–99)
Glucose-Capillary: 170 mg/dL — ABNORMAL HIGH (ref 70–99)
Glucose-Capillary: 166 mg/dL — ABNORMAL HIGH (ref 70–99)
Glucose-Capillary: 267 mg/dL — ABNORMAL HIGH (ref 70–99)

## 2021-08-16 MED ORDER — HYDRALAZINE HCL 25 MG PO TABS
12.5000 mg | ORAL_TABLET | Freq: Three times a day (TID) | ORAL | Status: DC
  Administered 2021-08-16 – 2021-08-17 (×3): 12.5 mg via ORAL
  Filled 2021-08-16 (×3): qty 1

## 2021-08-16 NOTE — Evaluation (Signed)
Occupational Therapy Evaluation Patient Details Name: [name]. MRN: [id number] DOB: 1961/02/10 Today's Date: 08/16/2021   History of Present Illness 61 y.o. male with medical history significant of asthma; BPH;  CAD s/p CABG; afib; chronic combined CHF; stasis dermatitis; s/p renal transplant (2012); HTN; HLD; morbid obesity with OHS/OSA; pulm HTN; and DM presenting with SOB.   Clinical Impression   Patient admitted for the diagnosis above.  PTA he works full time, and continues to walk up to 2 miles/day.  He does not use an AD, and needed no help with ADL/IADL.  Currently he is stating he feels much better, and is presenting at his baseline.  No OT needs in the acute setting.  Encouraged patient to continue walking the halls.  Advised PT he has no mobility concerns, and they can screen if needed.  No post acute rehab needs identified.         Recommendations for follow up therapy are one component of a multi-disciplinary discharge planning process, led by the attending physician.  Recommendations may be updated based on patient status, additional functional criteria and insurance authorization.   Follow Up Recommendations  No OT follow up    Assistance Recommended at Discharge None  Patient can return home with the following      Functional Status Assessment  Patient has not had a recent decline in their functional status  Equipment Recommendations  None recommended by OT    Recommendations for Other Services  None     Precautions / Restrictions Precautions Precautions: None Restrictions Weight Bearing Restrictions: No      Mobility Bed Mobility Overal bed mobility: Independent                  Transfers Overall transfer level: Independent                        Balance Overall balance assessment: Mild deficits observed, not formally tested                                         ADL either performed or assessed with  clinical judgement   ADL Overall ADL's : At baseline                                             Vision Baseline Vision/History: 1 Wears glasses Patient Visual Report: No change from baseline Vision Assessment?: No apparent visual deficits     Perception Perception Perception: Not tested   Praxis Praxis Praxis: Not tested    Pertinent Vitals/Pain Pain Assessment Pain Assessment: No/denies pain Pain Intervention(s): Monitored during session     Hand Dominance Right   Extremity/Trunk Assessment Upper Extremity Assessment Upper Extremity Assessment: Overall WFL for tasks assessed   Lower Extremity Assessment Lower Extremity Assessment: Overall WFL for tasks assessed;RLE deficits/detail RLE Deficits / Details: chronic knee pain   Cervical / Trunk Assessment Cervical / Trunk Assessment: Normal   Communication Communication Communication: No difficulties   Cognition Arousal/Alertness: Awake/alert Behavior During Therapy: WFL for tasks assessed/performed Overall Cognitive Status: Within Functional Limits for tasks assessed  General Comments   VSS on RA    Exercises     Shoulder Instructions      Home Living Family/patient expects to be discharged to:: Private residence Living Arrangements: Spouse/significant other;Children Available Help at Discharge: Family;Available PRN/intermittently Type of Home: House Home Access: Stairs to enter [employer] of Steps: 4 Entrance Stairs-Rails: None Home Layout: One level     Bathroom Shower/Tub: Occupational psychologist: Standard     Home Equipment: Cane - single point          Prior Functioning/Environment Prior Level of Function : Independent/Modified Independent                        OT Problem List: Decreased activity tolerance      OT Treatment/Interventions:      OT Goals(Current goals can be found  in the care plan section) Acute Rehab OT Goals Patient Stated Goal: Return home when cleared OT Goal Formulation: With patient Time For Goal Achievement: 08/20/21 Potential to Achieve Goals: Good  OT Frequency:      Co-evaluation              AM-PAC OT "6 Clicks" Daily Activity     Outcome Measure Help from another person eating meals?: None Help from another person taking care of personal grooming?: None Help from another person toileting, which includes using toliet, bedpan, or urinal?: None Help from another person bathing (including washing, rinsing, drying)?: None Help from another person to put on and taking off regular upper body clothing?: None Help from another person to put on and taking off regular lower body clothing?: None 6 Click Score: 24   End of Session Nurse Communication: Mobility status  Activity Tolerance: Patient tolerated treatment well Patient left: in chair;with call bell/phone within reach  OT Visit Diagnosis: Unsteadiness on feet (R26.81)                Time: 6789-3810 OT Time Calculation (min): 15 min Charges:  OT General Charges $OT Visit: 1 Visit OT Evaluation $OT Eval Moderate Complexity: 1 Mod  08/16/2021  RP, OTR/L  Acute Rehabilitation Services  Office:  [phone]   [name] 08/16/2021, 1:41 PM

## 2021-08-16 NOTE — Progress Notes (Addendum)
[facility] Progress Note   61 y.o. male DM, HTN, HLD, OSA, CASHD s/p CABG 2005, PAD, combined systolic and diastolic CHF, atrial fibrillation, gout, ESRD s/p DDRT 2012 at [facility] in [place]. He is a very compliant patient adhering to a low sodium diet, very active but noted that he was more short of breath and only able to walk 1/4 mile. He also has had orthopnea and noted that his abdomen has been getting larger with increasing weights. He also states that he has noted less urine coming out despite being compliant with the Torsemide and taking another Metolazone 2.67m yesterday. He denies fever, chills, dysuria, cough, chest pain, nausea. He also denies nausea, vomiting, dizziness, lightheadedness, headache,  blurry vision. Takes Torsemide 1062mBID, Metolazone 2.68m19mwice weekly    Assessment/ Plan:   1) Renal transplant status CKD stage IIIB with diabetic nephropathy-biopsy 2017   Immunosuppression induction with Campath, now maintenance with tacrolimus and mycophenolate. 1 month allograft biopsy July 2012- recovering ATN with early tubular atrophy, interstitial fibrosis. Repeat biopsy March 2017 for increasing proteinuria- no acute rejection, showed diabetic nephropathy, FSGS. 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   [name] at which time the Cr was 1.68.   - Continue home dose of immunosuppression Currently mycophenolate (Myfortic) 360 mg twice a day and Prograf dose 2 mg in a.m. and 2 mg in the afternoon.     - Will hold on a prograf level as renal function is already starting to improve, transplant kidney ultrasound and a PVR to r/o obstructive uropathy as pt is on Flomax 0.4mg33mD. - He is responding well to Lasix 80mg34mr -> cont for now. - Lokelma 10gm BID -> can stop   - there is mild hydronephrosis in the transplanted  kidney -> need a good bladder scan today. Unclear why there's mild hydro, possibly incompetent valve? Regardless he has GREAT UOP that is not being recorded. At least 2L out since last night.  - no absolute indication for RRT and the patient appears to be  comfortable. - -Monitor Daily I/Os, Daily weight  -Maintain MAP>65 for optimal renal perfusion.  -Avoid nephrotoxic medications including NSAIDs, judicious use of contrast, avoid gadolinium.   2) Pulmonary hypertension, lower extremity edema Usually on an aggressive diuretic regimen of torsemide 100 mg twice a day Follow strict low-sodium diet; he is very compliant with his diet   3) DM-2 on insulin therapy per primary   4) Atrial fibrillation on Eliquis   5) Severe pulmonary HTN - on Tadalafil   6) CHF (combined systolic and diastolic) with EF 35% -58%ly mild vascular congestion on CXR   7) BPH on Flomax   8) Gout on Uloric.   9) Chronic venous stasis usually treated with diuretics  Subjective:   Feeling better w/ large amounts of UOP. Denies f/c/n/v/sob.   Objective:   BP (!) 141/71 (BP Location: Right Arm)   Pulse 66   Temp 98.6 F (37 C) (Oral)   Resp 16   Ht _0  (1.676 m)   Wt 122.4 kg   SpO2 91%   BMI 43.56 kg/m   Intake/Output Summary (Last 24 hours) at 08/16/2021 1114 Last data filed at 08/16/2021 1054 Gross per 24 hour  Intake 1055 ml  Output 750 ml  Net 305 ml   Weight change:    Physical  Exam: GEN: NAD, A&Ox3, NCAT HEENT: No conjunctival pallor, EOMI NECK: Supple, no thyromegaly LUNGS: CTA B/L no rales, rhonchi or wheezing CV: irreg rhythm ABD: SNDNT +BS  EXT: 1+ lower extremity edema GU: no foley Access: lt BCF w/ "puffy" bruit  Imaging: DG Chest 2 View  Result Date: 08/15/2021 CLINICAL DATA:  Short of breath EXAM: CHEST - 2 VIEW COMPARISON:  07/12/2019 FINDINGS: Prior CABG. Cardiac enlargement. Mild vascular congestion. Negative for pulmonary edema or pleural effusion. Negative for  pneumonia. Calcified right paratracheal lymph node unchanged. IMPRESSION: Cardiac enlargement with mild vascular congestion. Negative for edema or effusion. Electronically Signed   By: Franchot Gallo M.D.   On: 08/15/2021 11:42   US Renal Transplant w/Doppler  Result Date: 08/15/2021 CLINICAL DATA:  [id number]. Status post renal transplantation. Acute renal failure. EXAM: ULTRASOUND OF RENAL TRANSPLANT WITH RENAL DOPPLER ULTRASOUND TECHNIQUE: Ultrasound examination of the renal transplant was performed with gray-scale, color and duplex doppler evaluation. COMPARISON:  07/16/2019 FINDINGS: Transplant kidney location: Right lower quadrant Transplant Kidney: Renal measurements: 11.2 x 5.0 x 5.4 cm = volume: 153m. Renal cortical echogenicity is normal and cortical thickness is preserved. There has developed mild hydronephrosis. No intrarenal masses or calcifications are seen. No perinephric fluid collections are identified. Color flow in the main renal artery:  Yes Color flow in the main renal vein:  Yes Duplex Doppler Evaluation: Main Renal Artery Velocity: 55 cm/sec Main Renal Artery Resistive Index: 0.86 Venous waveform in main renal vein:  Present Intrarenal resistive index in upper pole:  0.75 (normal 0.6-0.8; equivocal 0.8-0.9; abnormal >= 0.9) Intrarenal resistive index in lower pole: 0.66 (normal 0.6-0.8; equivocal 0.8-0.9; abnormal >= 0.9) Bladder: Normal for degree of bladder distention. Other findings:  None. IMPRESSION: Interval development of mild hydronephrosis. Slightly elevated resistive indices within the main right renal artery this is equivocal given the normal resistive indices at the corticomedullary junctions bilaterally, but can be seen in the setting of ureteric obstruction (given the development of mild hydronephrosis) as well as transplant rejection or drug toxicity. Electronically Signed   By: AFidela SalisburyM.D.   On: 08/15/2021 21:15   ECHOCARDIOGRAM COMPLETE  Result Date: 08/16/2021     ECHOCARDIOGRAM REPORT   Patient Name:   [name] Date of Exam: 08/16/2021 Medical Rec #:  [id number]              Height:       66.0 in Accession #:    [id number]             Weight:       269.9 lb Date of Birth:  [birth year]              BSA:          2.271 m Patient Age:    89years                BP:           141/71 mmHg Patient Gender: M                       HR:           60 bpm. Exam Location:  Inpatient Procedure: 2D Echo, Cardiac Doppler and Color Doppler Indications:    CHF  History:        Patient has prior history of Echocardiogram examinations, most  recent 05/03/2019. CHF; Risk Factors:Hypertension and Diabetes.  Sonographer:    Jefferey Pica Referring Phys: [place]  1. Left ventricular ejection fraction, by estimation, is 40 to 45%. Left ventricular ejection fraction by PLAX is 40 %. The left ventricle has mildly decreased function. The left ventricle demonstrates global hypokinesis. The left ventricular internal cavity size was moderately dilated. There is moderate left ventricular hypertrophy. Left ventricular diastolic parameters are consistent with Grade III diastolic dysfunction (restrictive). Elevated left ventricular end-diastolic pressure.  2. Right ventricular systolic function is mildly reduced. The right ventricular size is normal. Tricuspid regurgitation signal is inadequate for assessing PA pressure.  3. Left atrial size was severely dilated.  4. Right atrial size was moderately dilated.  5. A small pericardial effusion is present. The pericardial effusion is circumferential. There is no evidence of cardiac tamponade.  6. The mitral valve is abnormal. Mild mitral valve regurgitation. Moderate to severe mitral annular calcification.  7. The aortic valve is tricuspid. Aortic valve regurgitation is not visualized.  8. The inferior vena cava is dilated in size with <50% respiratory variability, suggesting right atrial pressure of 15 mmHg.  Comparison(s): Changes from prior study are noted. 05/03/2019: LVEF 45-50%. FINDINGS  Left Ventricle: Left ventricular ejection fraction, by estimation, is 40 to 45%. Left ventricular ejection fraction by PLAX is 40 %. The left ventricle has mildly decreased function. The left ventricle demonstrates global hypokinesis. The left ventricular internal cavity size was moderately dilated. There is moderate left ventricular hypertrophy. Left ventricular diastolic parameters are consistent with Grade III diastolic dysfunction (restrictive). Elevated left ventricular end-diastolic pressure. Right Ventricle: The right ventricular size is normal. No increase in right ventricular wall thickness. Right ventricular systolic function is mildly reduced. Tricuspid regurgitation signal is inadequate for assessing PA pressure. Left Atrium: Left atrial size was severely dilated. Right Atrium: Right atrial size was moderately dilated. Pericardium: A small pericardial effusion is present. The pericardial effusion is circumferential. There is no evidence of cardiac tamponade. Mitral Valve: The mitral valve is abnormal. Moderate to severe mitral annular calcification. Mild mitral valve regurgitation. Tricuspid Valve: The tricuspid valve is grossly normal. Tricuspid valve regurgitation is trivial. Aortic Valve: The aortic valve is tricuspid. Aortic valve regurgitation is not visualized. Aortic valve peak gradient measures 12.9 mmHg. Pulmonic Valve: The pulmonic valve was not well visualized. Pulmonic valve regurgitation is not visualized. Aorta: The aortic root and ascending aorta are structurally normal, with no evidence of dilitation. Venous: The inferior vena cava is dilated in size with less than 50% respiratory variability, suggesting right atrial pressure of 15 mmHg. IAS/Shunts: No atrial level shunt detected by color flow Doppler.  LEFT VENTRICLE PLAX 2D LV EF:         Left            Diastology                ventricular     LV e'  lateral:   7.42 cm/s                ejection        LV E/e' lateral: 23.3                fraction by                PLAX is 40                %. LVIDd:         6.00 cm LVIDs:  4.80 cm LV PW:         1.40 cm LV IVS:        1.40 cm LVOT diam:     2.20 cm LV SV:         130 LV SV Index:   57 LVOT Area:     3.80 cm  RIGHT VENTRICLE            IVC RV S prime:     7.76 cm/s  IVC diam: 3.30 cm LEFT ATRIUM              Index        RIGHT ATRIUM           Index LA diam:        5.80 cm  2.55 cm/m   RA Area:     27.50 cm LA Vol (A2C):   87.4 ml  38.48 ml/m  RA Volume:   95.60 ml  42.09 ml/m LA Vol (A4C):   129.0 ml 56.79 ml/m LA Biplane Vol: 113.0 ml 49.75 ml/m  AORTIC VALVE                  PULMONIC VALVE AV Area (Vmax): 3.24 cm      PV Vmax:       1.00 m/s AV Vmax:        179.50 cm/s   PV Peak grad:  4.0 mmHg AV Peak Grad:   12.9 mmHg LVOT Vmax:      153.00 cm/s LVOT Vmean:     107.000 cm/s LVOT VTI:       0.342 m  AORTA Ao Root diam: 3.50 cm Ao Asc diam:  3.60 cm MITRAL VALVE MV Area (PHT): 5.23 cm     SHUNTS MV Decel Time: 145 msec     Systemic VTI:  0.34 m MV E velocity: 173.00 cm/s  Systemic Diam: 2.20 cm Lyman Bishop MD Electronically signed by Lyman Bishop MD Signature Date/Time: 08/16/2021/11:10:55 AM    Final     Labs: BMET Recent Labs  Lab 08/15/21 1159 08/15/21 1412 08/15/21 1843 08/16/21 0249  NA 142 143 141 140  K 6.0* 7.2* 6.1* 5.0  CL 107 109 107 107  CO2 29  --  26 27  GLUCOSE 71 131* 164* 132*  BUN 51* 51* 51* 49*  CREATININE 2.23* 2.40* 2.35* 2.22*  CALCIUM 9.2  --  9.5 9.5   CBC Recent Labs  Lab 08/15/21 1159 08/15/21 1412 08/16/21 0249  WBC 4.5  --  5.5  NEUTROABS 2.8  --   --   HGB 13.3 13.6 12.5*  HCT 43.0 40.0 39.4  MCV 87.4  --  86.8  PLT 121*  --  93*    Medications:     apixaban  5 mg Oral BID   budesonide (PULMICORT) nebulizer solution  0.25 mg Nebulization BID   buPROPion  150 mg Oral BID   colchicine  0.6 mg Oral QODAY   fluticasone  2 spray  Each Nare Daily   furosemide  80 mg Intravenous Q8H   gabapentin  300 mg Oral BID   insulin aspart  0-15 Units Subcutaneous TID WC   insulin aspart  0-5 Units Subcutaneous QHS   insulin glargine-yfgn  40 Units Subcutaneous QHS   montelukast  10 mg Oral QHS   mycophenolate  360 mg Oral BID   pantoprazole  40 mg Oral Daily   predniSONE  5 mg Oral Daily   rosuvastatin  10 mg  Oral Daily   sodium chloride flush  3 mL Intravenous Q12H   [START ON 08/17/2021] sulfamethoxazole-trimethoprim  1 tablet Oral Once per day on Mon Wed Fri   tacrolimus  2 mg Oral BID   tadalafil  20 mg Oral QODAY   tamsulosin  0.4 mg Oral Daily      Otelia Santee, MD 08/16/2021, 11:14 AM

## 2021-08-16 NOTE — Progress Notes (Signed)
PROGRESS NOTE    [name].  GDJ:[id number] DOB: [birth year] DOA: 08/15/2021 PCP: [name], MD   Brief Narrative:  HPI: [name]. is a 61 y.o. male with medical history significant of asthma; BPH;  CAD s/p CABG; afib; chronic combined CHF; stasis dermatitis; s/p renal transplant (2012); HTN; HLD; morbid obesity with OHS/OSA; pulm HTN; and DM presenting with SOB.  He reports that he is here with fluid overload.  He noticed his weight creeping up and he took metolazone yesterday and it didn't help so he took a toresemide.  He took some more torsemide and it still didn't seem to work.  +orthopnea.  His potassium was low and so they told him to "take a couple of pills more a day."  He started taking 4-5 extra a day and was planning to get blood work today. He usually walks 1.5 miles a per and yesterday he could barely make it 1/4 mile.  His abdomen usually gets bigger with fluid.  He couldn't sleep last night, felt like he was choking.  He went to his recliner and has been up since.  He called his CHF clinic this AM, out in the garden - his BP was elevated and she told him to come in.   He lives in [place] and they advised him to [facility].  He got here and got diarrhea.     ER Course:  Renal transplant in 2012.  CHF, followed by [facility].  Due for echo on 5/10, prior h/o EF 35%.  Recently increased diuretic.  On loads of K+ replacement and taking extra - K+ is 6.0, 7.2.  LE edema, abdominal swelling.  Renal function mildly worse than baseline.  QRS mildly widened.  Give Lokelma, insulin, dextrose, albuterol, Lasix.    Assessment & Plan:   Principal Problem:   Acute on chronic systolic CHF (congestive heart failure) (HCC) Active Problems:   Hyperlipidemia   Asthma, persistent controlled   History of renal transplant   Major depressive disorder   Chronic atrial fibrillation   Essential hypertension   Type 2 diabetes mellitus with hyperglycemia, with long-term  current use of insulin   Pulmonary hypertension due to left heart disease   Hyperkalemia, diminished renal excretion   Diarrhea   Chronic kidney disease, stage 3b (HCC)  Acute on chronic combined systolic and diastolic CHF/pulmonary hypertension: Chest x-ray with vascular congestion but BNP within normal limits.  Echo in 2/21 with EF 45-50%, moderately dilated RV with moderately decreased systolic function, moderate pulmonary hypertension. Admitted w/ volume overload and progression to NYHA class III symptoms. Scheduled weekly Metolazone recently discontinued in setting worsening renal function. Echo this admit EF 40-45%, RV mildly reduced, GIIIDD (restrictive), IVC dilated. Diuresing well w/ IV Lasix. Wt and SCr both trending down. Nephrology dosing diuretics. Remains fluid overloaded today Continue IV Lasix per nephrology.  Home dapagliflozin, Spiro and losartan on hold w/ recent bump in SCr (2.2, prior BL ~1.7).  Low-dose hydralazine 12.5 mg 3 times daily added by cardiology for afterload reduction, no Imdur given tadalafil use for pulmonary hypertension.  Per cardiology, they plan to do right heart cath prior to DC.   Hyperkalemia -Patient was prescribed a very high dose of KCl (80 mEq BID) and then also took additional doses per his doctor's recommendations.  Came in with significant hyperkalemia, given calcium gluconate, albuterol, Lasix and insulin/glucose as well as IV fluid and Lokelma in the ED, potassium normal today.  Diarrhea Resolved.  Stage 3b CKD: Stable    S/p renal transplant: Doppler renal ultrasound shows mild right hydronephrosis.  Patient on mycophenolate, prednisone and tacrolimus.  Management per nephrology.  HTN: Blood pressure slightly elevated, diastolic.  Cozaar on hold due to hyperkalemia.  Hydralazine added by cardiology.   HLD -Continue Lipitor   DM type II: Blood sugar controlled, continue current dose of Semglee 40 units with SSI.  Pulmonary  HTN -Continue tadalafil   Afib: Rate controlled.  Continue Eliquis.  CAD -s/p CABG   Asthma -Controlled.  Continue Albuterol, Qvar (budesonide per pharmacy), Singulair   Morbid obesity -Body mass index is 44.22 kg/m..  -Weight loss encouraged and counseled. -Outpatient PCP/bariatric medicine/bariatric surgery f/u encouraged    Mood d/o -Continue bupropion   Gout -Continue colchicine, febuxostat  DVT prophylaxis:    Code Status: Full Code  Family Communication:  None present at bedside.  Plan of care discussed with patient in length and he/she verbalized understanding and agreed with it.  Status is: Inpatient Remains inpatient appropriate because: Needs cardiac catheterization.   Estimated body mass index is 43.56 kg/m as calculated from the following:   Height as of this encounter: _0  (1.676 m).   Weight as of this encounter: 122.4 kg.    Nutritional Assessment: Body mass index is 43.56 kg/m.Marland Kitchen Seen by dietician.  I agree with the assessment and plan as outlined below: Nutrition Status:        . Skin Assessment: I have examined the patient's skin and I agree with the wound assessment as performed by the wound care RN as outlined below:    Consultants:  Cardiology  Procedures:  None  Antimicrobials:  Anti-infectives (From admission, onward)    Start     Dose/Rate Route Frequency Ordered Stop   08/17/21 0900  sulfamethoxazole-trimethoprim (BACTRIM) 400-80 MG per tablet 1 tablet       Note to Pharmacy: Monday, Wednesday, Friday     1 tablet Oral Once per day on Mon Wed Fri 08/15/21 1838           Subjective: Patient seen and examined.  He feels better.  No complaints.  Objective: Vitals:   08/16/21 0343 08/16/21 0500 08/16/21 0758 08/16/21 1413  BP: (!) 145/84  (!) 141/71 (!) 125/91  Pulse: 78  66   Resp: 15  16   Temp: 97.8 F (36.6 C)  98.6 F (37 C)   TempSrc: Oral  Oral   SpO2: 91%     Weight:  122.4 kg    Height:         Intake/Output Summary (Last 24 hours) at 08/16/2021 1422 Last data filed at 08/16/2021 1400 Gross per 24 hour  Intake 920 ml  Output 1550 ml  Net -630 ml   Filed Weights   08/15/21 1122 08/16/21 0500  Weight: 124.3 kg 122.4 kg    Examination:  General exam: Appears calm and comfortable, morbidly obese Respiratory system: Clear to auscultation. Respiratory effort normal. Cardiovascular system: S1 & S2 heard, RRR. No JVD, murmurs, rubs, gallops or clicks. No pedal edema. Gastrointestinal system: Abdomen is nondistended, soft and nontender. No organomegaly or masses felt. Normal bowel sounds heard. Central nervous system: Alert and oriented. No focal neurological deficits. Extremities: Symmetric 5 x 5 power. Skin: No rashes, lesions or ulcers Psychiatry: Judgement and insight appear normal. Mood & affect appropriate.    Data Reviewed: I have personally reviewed following labs and imaging studies  CBC: Recent Labs  Lab 08/15/21 1159 08/15/21 1412 08/16/21  0249  WBC 4.5  --  5.5  NEUTROABS 2.8  --   --   HGB 13.3 13.6 12.5*  HCT 43.0 40.0 39.4  MCV 87.4  --  86.8  PLT 121*  --  93*   Basic Metabolic Panel: Recent Labs  Lab 08/15/21 1159 08/15/21 1412 08/15/21 1843 08/16/21 0249  NA 142 143 141 140  K 6.0* 7.2* 6.1* 5.0  CL 107 109 107 107  CO2 29  --  26 27  GLUCOSE 71 131* 164* 132*  BUN 51* 51* 51* 49*  CREATININE 2.23* 2.40* 2.35* 2.22*  CALCIUM 9.2  --  9.5 9.5   GFR: Estimated Creatinine Clearance: 43.1 mL/min (A) (by C-G formula based on SCr of 2.22 mg/dL (H)). Liver Function Tests: Recent Labs  Lab 08/15/21 1159  AST 30  ALT 18  ALKPHOS 133*  BILITOT 0.9  PROT 7.4  ALBUMIN 3.8   No results for input(s): LIPASE, AMYLASE in the last 168 hours. No results for input(s): AMMONIA in the last 168 hours. Coagulation Profile: No results for input(s): INR, PROTIME in the last 168 hours. Cardiac Enzymes: No results for input(s): CKTOTAL, CKMB,  CKMBINDEX, TROPONINI in the last 168 hours. BNP (last 3 results) No results for input(s): PROBNP in the last 8760 hours. HbA1C: Recent Labs    08/15/21 1843  HGBA1C 7.6*   CBG: Recent Labs  Lab 08/15/21 1743 08/15/21 1817 08/15/21 2104 08/16/21 0603 08/16/21 1112  GLUCAP 109* 139* 154* 100* 170*   Lipid Profile: No results for input(s): CHOL, HDL, LDLCALC, TRIG, CHOLHDL, LDLDIRECT in the last 72 hours. Thyroid Function Tests: No results for input(s): TSH, T4TOTAL, FREET4, T3FREE, THYROIDAB in the last 72 hours. Anemia Panel: No results for input(s): VITAMINB12, FOLATE, FERRITIN, TIBC, IRON, RETICCTPCT in the last 72 hours. Sepsis Labs: No results for input(s): PROCALCITON, LATICACIDVEN in the last 168 hours.  No results found for this or any previous visit (from the past 240 hour(s)).   Radiology Studies: DG Chest 2 View  Result Date: 08/15/2021 CLINICAL DATA:  Short of breath EXAM: CHEST - 2 VIEW COMPARISON:  07/12/2019 FINDINGS: Prior CABG. Cardiac enlargement. Mild vascular congestion. Negative for pulmonary edema or pleural effusion. Negative for pneumonia. Calcified right paratracheal lymph node unchanged. IMPRESSION: Cardiac enlargement with mild vascular congestion. Negative for edema or effusion. Electronically Signed   By: Franchot Gallo M.D.   On: 08/15/2021 11:42   US Renal Transplant w/Doppler  Result Date: 08/15/2021 CLINICAL DATA:  [id number]. Status post renal transplantation. Acute renal failure. EXAM: ULTRASOUND OF RENAL TRANSPLANT WITH RENAL DOPPLER ULTRASOUND TECHNIQUE: Ultrasound examination of the renal transplant was performed with gray-scale, color and duplex doppler evaluation. COMPARISON:  07/16/2019 FINDINGS: Transplant kidney location: Right lower quadrant Transplant Kidney: Renal measurements: 11.2 x 5.0 x 5.4 cm = volume: 154m. Renal cortical echogenicity is normal and cortical thickness is preserved. There has developed mild hydronephrosis. No  intrarenal masses or calcifications are seen. No perinephric fluid collections are identified. Color flow in the main renal artery:  Yes Color flow in the main renal vein:  Yes Duplex Doppler Evaluation: Main Renal Artery Velocity: 55 cm/sec Main Renal Artery Resistive Index: 0.86 Venous waveform in main renal vein:  Present Intrarenal resistive index in upper pole:  0.75 (normal 0.6-0.8; equivocal 0.8-0.9; abnormal >= 0.9) Intrarenal resistive index in lower pole: 0.66 (normal 0.6-0.8; equivocal 0.8-0.9; abnormal >= 0.9) Bladder: Normal for degree of bladder distention. Other findings:  None. IMPRESSION: Interval development of mild hydronephrosis. Slightly  elevated resistive indices within the main right renal artery this is equivocal given the normal resistive indices at the corticomedullary junctions bilaterally, but can be seen in the setting of ureteric obstruction (given the development of mild hydronephrosis) as well as transplant rejection or drug toxicity. Electronically Signed   By: Fidela Salisbury M.D.   On: 08/15/2021 21:15   ECHOCARDIOGRAM COMPLETE  Result Date: 08/16/2021    ECHOCARDIOGRAM REPORT   Patient Name:   Pinchus Weckwerth. Date of Exam: 08/16/2021 Medical Rec #:  [id number]               Height:       66.0 in Accession #:    [id number]              Weight:       269.9 lb Date of Birth:  16-Sep-1960               BSA:          2.271 m Patient Age:    54 years                BP:           141/71 mmHg Patient Gender: M                       HR:           60 bpm. Exam Location:  Inpatient Procedure: 2D Echo, Cardiac Doppler and Color Doppler Indications:    CHF  History:        Patient has prior history of Echocardiogram examinations, most                 recent 05/03/2019. CHF; Risk Factors:Hypertension and Diabetes.  Sonographer:    Jefferey Pica Referring Phys: [place]  1. Left ventricular ejection fraction, by estimation, is 40 to 45%. Left ventricular ejection  fraction by PLAX is 40 %. The left ventricle has mildly decreased function. The left ventricle demonstrates global hypokinesis. The left ventricular internal cavity size was moderately dilated. There is moderate left ventricular hypertrophy. Left ventricular diastolic parameters are consistent with Grade III diastolic dysfunction (restrictive). Elevated left ventricular end-diastolic pressure.  2. Right ventricular systolic function is mildly reduced. The right ventricular size is normal. Tricuspid regurgitation signal is inadequate for assessing PA pressure.  3. Left atrial size was severely dilated.  4. Right atrial size was moderately dilated.  5. A small pericardial effusion is present. The pericardial effusion is circumferential. There is no evidence of cardiac tamponade.  6. The mitral valve is abnormal. Mild mitral valve regurgitation. Moderate to severe mitral annular calcification.  7. The aortic valve is tricuspid. Aortic valve regurgitation is not visualized.  8. The inferior vena cava is dilated in size with <50% respiratory variability, suggesting right atrial pressure of 15 mmHg. Comparison(s): Changes from prior study are noted. 05/03/2019: LVEF 45-50%. FINDINGS  Left Ventricle: Left ventricular ejection fraction, by estimation, is 40 to 45%. Left ventricular ejection fraction by PLAX is 40 %. The left ventricle has mildly decreased function. The left ventricle demonstrates global hypokinesis. The left ventricular internal cavity size was moderately dilated. There is moderate left ventricular hypertrophy. Left ventricular diastolic parameters are consistent with Grade III diastolic dysfunction (restrictive). Elevated left ventricular end-diastolic pressure. Right Ventricle: The right ventricular size is normal. No increase in right ventricular wall thickness. Right ventricular systolic function is mildly reduced. Tricuspid regurgitation signal is inadequate  for assessing PA pressure. Left Atrium: Left  atrial size was severely dilated. Right Atrium: Right atrial size was moderately dilated. Pericardium: A small pericardial effusion is present. The pericardial effusion is circumferential. There is no evidence of cardiac tamponade. Mitral Valve: The mitral valve is abnormal. Moderate to severe mitral annular calcification. Mild mitral valve regurgitation. Tricuspid Valve: The tricuspid valve is grossly normal. Tricuspid valve regurgitation is trivial. Aortic Valve: The aortic valve is tricuspid. Aortic valve regurgitation is not visualized. Aortic valve peak gradient measures 12.9 mmHg. Pulmonic Valve: The pulmonic valve was not well visualized. Pulmonic valve regurgitation is not visualized. Aorta: The aortic root and ascending aorta are structurally normal, with no evidence of dilitation. Venous: The inferior vena cava is dilated in size with less than 50% respiratory variability, suggesting right atrial pressure of 15 mmHg. IAS/Shunts: No atrial level shunt detected by color flow Doppler.  LEFT VENTRICLE PLAX 2D LV EF:         Left            Diastology                ventricular     LV e' lateral:   7.42 cm/s                ejection        LV E/e' lateral: 23.3                fraction by                PLAX is 40                %. LVIDd:         6.00 cm LVIDs:         4.80 cm LV PW:         1.40 cm LV IVS:        1.40 cm LVOT diam:     2.20 cm LV SV:         130 LV SV Index:   57 LVOT Area:     3.80 cm  RIGHT VENTRICLE            IVC RV S prime:     7.76 cm/s  IVC diam: 3.30 cm LEFT ATRIUM              Index        RIGHT ATRIUM           Index LA diam:        5.80 cm  2.55 cm/m   RA Area:     27.50 cm LA Vol (A2C):   87.4 ml  38.48 ml/m  RA Volume:   95.60 ml  42.09 ml/m LA Vol (A4C):   129.0 ml 56.79 ml/m LA Biplane Vol: 113.0 ml 49.75 ml/m  AORTIC VALVE                  PULMONIC VALVE AV Area (Vmax): 3.24 cm      PV Vmax:       1.00 m/s AV Vmax:        179.50 cm/s   PV Peak grad:  4.0 mmHg AV Peak  Grad:   12.9 mmHg LVOT Vmax:      153.00 cm/s LVOT Vmean:     107.000 cm/s LVOT VTI:       0.342 m  AORTA Ao Root diam: 3.50 cm Ao Asc diam:  3.60 cm MITRAL VALVE MV  Area (PHT): 5.23 cm     SHUNTS MV Decel Time: 145 msec     Systemic VTI:  0.34 m MV E velocity: 173.00 cm/s  Systemic Diam: 2.20 cm Lyman Bishop MD Electronically signed by Lyman Bishop MD Signature Date/Time: 08/16/2021/11:10:55 AM    Final     Scheduled Meds:  apixaban  5 mg Oral BID   budesonide (PULMICORT) nebulizer solution  0.25 mg Nebulization BID   buPROPion  150 mg Oral BID   colchicine  0.6 mg Oral QODAY   fluticasone  2 spray Each Nare Daily   furosemide  80 mg Intravenous Q8H   gabapentin  300 mg Oral BID   hydrALAZINE  12.5 mg Oral Q8H   insulin aspart  0-15 Units Subcutaneous TID WC   insulin aspart  0-5 Units Subcutaneous QHS   insulin glargine-yfgn  40 Units Subcutaneous QHS   montelukast  10 mg Oral QHS   mycophenolate  360 mg Oral BID   pantoprazole  40 mg Oral Daily   predniSONE  5 mg Oral Daily   rosuvastatin  10 mg Oral Daily   sodium chloride flush  3 mL Intravenous Q12H   [START ON 08/17/2021] sulfamethoxazole-trimethoprim  1 tablet Oral Once per day on Mon Wed Fri   tacrolimus  2 mg Oral BID   tadalafil  20 mg Oral QODAY   tamsulosin  0.4 mg Oral Daily   Continuous Infusions:   LOS: 1 day   Darliss Cheney, MD [facility]  08/16/2021, 2:22 PM   *Please note that this is a verbal dictation therefore any spelling or grammatical errors are due to the "[place]" system interpretation.  Please page via [place] and do not message via secure chat for urgent patient care matters. Secure chat can be used for non urgent patient care matters.  How to contact the [facility] Attending or Consulting provider [place] or covering provider during after hours [place], for this patient?  Check the care team in [facility] and look for a) attending/consulting TR[facility] provider listed and b) the [facility] team listed. Page or  secure chat [phone]. Log into [email or web address] and use [place]'s universal password to access. If you do not have the password, please contact the hospital operator. Locate the [facility] provider you are looking for under [facility] and page to a number that you can be directly reached. If you still have difficulty reaching the provider, please page the [facility] (Director on Call) for the Hospitalists listed on amion for assistance.

## 2021-08-16 NOTE — Progress Notes (Signed)
Pt has home CPAP at bedside. Machine and cords inspected by this RT and is free of any frays/damage. Advised pt to notify for RT if any further assistance is needed.

## 2021-08-16 NOTE — Progress Notes (Signed)
Heart Failure Navigator Progress Note  Assessed for Heart & Vascular TOC clinic readiness.  Patient does not meet criteria due to patient is being seen by Advance Heart Failure Team..     Earnestine Leys, BSN, RN Heart Failure Nurse Navigator Secure Chat Only

## 2021-08-16 NOTE — Consult Note (Signed)
   [facility] CM Inpatient Consult   08/16/2021  [name] [birth year] [id number]   [place] [facility] [ACO] Patient: [name]  Primary Care Provider:  [name], MD with [place], this is an Embedded provider with a Chronic Care Management program and team available.  Reviewed encounters and inpatient [facility] team notes and patient is noted with active contact with the AHF paramedicine team.  Reviewed PT/OT recommendations. Reviewed for high risk score for unplanned readmission risk. Patient note to have 1 admission in the past 6 months.     Plan: Patient will be assessed further for any additional post hospital follow up needs for support in the [place]. Currently, no additional needs assessed.   For additional questions or referrals please contact:   Natividad Brood, RN BSN [facility] Liaison  [phone] business mobile phone Toll free office [phone]  Fax number: [phone] [email or web address]

## 2021-08-16 NOTE — Consult Note (Addendum)
Advanced Heart Failure Team Consult Note   Primary Physician: [name], MD PCP-Cardiologist:  [name], MD [facility]: [name]   Reason for Consultation: acute on chronic systolic heart failure   HPI:    Carl Kyl Givler. is seen today for evaluation of acute on chronic systolic heart failure at the request of Dr. Doristine Bosworth, Internal Medicine.   Carl Aahan Marques. is a 61 y.o. male  with a history of CAD s/p CABG 2005, chronic atrial fibrillation on Eliquis, ESRD s/p renal transplant 2012, and chronic diastolic CHF.  His renal function has been fairly stable, most recent creatinine 1.43.  He has been struggling with volume overload/CHF.  He has a history of ischemic cardiomyopathy with EF as low as 35-40% in the past but echo in 3/20 showed EF 60-65%. There is evidence for RV failure with pulmonary hypertension by echo.     Admitted 11/05/18 low grade fever and shortness of breath. HS Trop negative. Covid 19 negative. Blood CX negative. Placed on antibiotics and discharged to home the next day.    Admitted [birth year] with A/C Diastolic HF. Diuresed with IV lasix and transitioned to torsemide 80/40 mg . Discharge weight 252 pounds.    Admitted 06/2019 with left arm pain. Left upper extremity venous Doppler revealed occluded cephalic vein at the AV fistula outflow tract in the antecubital fossa with no other thrombus identified. Vascular evaluated. He continued on Eliquis.  No plan for intervention. Also treated for acute gout flare. Discharged on prednisone taper.      Given IV lasix on 12/21 by HF Paramedicine.    Admitted to [facility] 2/22 with acute gout flare vs cellulitis. Nephrology was consulted to evaluate transplant regimen and follow along. He was treated with antibiotics and prednisone. Discharged on decreased torsemide dose of 40 mg daily. Weight 259 lbs.   Admitted 4/22 for left leg cellulitis after sustaining a dog scratch, received IV antibiotics. No changes  to his diuretic therapy. Discharge weight 266 lbs.   Follow up with [name], 1/23, he had CP and underwent Lexiscan myoview, which overall was a low risk study with evidence of prior infarct in the infero-apical area with no significant change from before. Losartan added to GDMT regimen.  Recently taken off of metolazone given rise in baseline SCr. Still taking high dose torsemide 100 mg bid.   Now admitted w/ a/c CHF. Presented w/ recent increase in exertional dyspnea w/ progression to NYHA Class III symptoms. + orthopnea and increased abdominal distention. Reports full med compliance. Denies dietary indiscretion.   In ED, he was noted to be hyperkalemic w/ K 7.2. SCr 2.23 (previous BL ~1.7). Fluid overloaded. BNP 228. CXR w/ mild vascular congestion. Treated w/ Lokelma, insulin and albuterol along with IV lasix.  Home losartan, aldactone and Farxiga held. [name] and Nephrology teams consulted. Nephrology dosing diuretics.  Wt is down 5 lb thus far.  Strict I/Os not recorded. SCr 2.35>>2.22 today. K down to 5.0. BP mildly elevated 875I systolic. Feeling slightly better. No resting dyspnea. Hasn't ambulated much since admit.   Echo completed today. EF unchanged from previous study, 40-45%, GIIIDD (restrictive) w/ mod LVH, RV mildly reduced, mod-severe MAC w/ only mild MR, severe LAE, mod RAE, + small pericardial effusion. IVC dilated in size with <50% respiratory variability, suggesting right atrial pressure of 15 mmHg.    Echo Today  Left ventricular ejection fraction, by estimation, is 40 to 45%. Left ventricular ejection fraction by PLAX is  40 %. The left ventricle has mildly decreased function. The left ventricle demonstrates global hypokinesis. The left ventricular internal cavity size was moderately dilated. There is moderate left ventricular hypertrophy. Left ventricular diastolic parameters are consistent with Grade III diastolic dysfunction (restrictive). Elevated left  ventricular end-diastolic pressure. 1. Right ventricular systolic function is mildly reduced. The right ventricular size is normal. Tricuspid regurgitation signal is inadequate for assessing PA pressure. 2. 3. Left atrial size was severely dilated. 4. Right atrial size was moderately dilated. A small pericardial effusion is present. The pericardial effusion is circumferential. There is no evidence of cardiac tamponade. 5. The mitral valve is abnormal. Mild mitral valve regurgitation. Moderate to severe mitral annular calcification. 6. 7. The aortic valve is tricuspid. Aortic valve regurgitation is not visualized. The inferior vena cava is dilated in size with <50% respiratory variability, suggesting right atrial pressure of 15 mmHg.   Review of Systems: [y] = yes, _0  = no   General: Weight gain [ Y]; Weight loss _1 ; Anorexia _2 ; Fatigue _3 ; Fever _4 ; Chills _5 ; Weakness _6   Cardiac: Chest pain/pressure _7 ; Resting SOB _8 ; Exertional SOB [ Y]; Orthopnea [Y ]; Pedal Edema [ Y]; Palpitations _9 ; Syncope _10 ; Presyncope _11 ; Paroxysmal nocturnal dyspnea_12   Pulmonary: Cough _13 ; Wheezing_14 ; Hemoptysis_15 ; Sputum _16 ; Snoring _17   GI: Vomiting_18 ; Dysphagia_19 ; Melena_20 ; Hematochezia _21 ; Heartburn_22 ; Abdominal pain _23 ; Constipation _24 ; Diarrhea _25 ; BRBPR _26   GU: Hematuria_27 ; Dysuria _28 ; Nocturia_29   Vascular: Pain in legs with walking _30 ; Pain in feet with lying flat _31 ; Non-healing sores _32 ; Stroke _33 ; TIA _34 ; Slurred speech _35 ;  Neuro: Headaches_36 ; Vertigo_37 ; Seizures_38 ; Paresthesias_39 ;Blurred vision _40 ; Diplopia _41 ; Vision changes _42   Ortho/Skin: Arthritis _43 ; Joint pain _44 ; Muscle pain _45 ; Joint swelling _46 ; Back Pain _47 ; Rash _48   Psych: Depression_49 ; Anxiety_50   Heme: Bleeding problems _51 ; Clotting disorders _52 ; Anemia _53   Endocrine: Diabetes [ Y]; Thyroid dysfunction_54   Home Medications Prior to Admission medications   Medication Sig  Start Date End Date Taking? Authorizing Provider  albuterol (PROVENTIL) (2.5 MG/3ML) 0.083% nebulizer solution Take 3 mLs (2.5 mg total) by nebulization every 6 (six) hours as needed for wheezing or shortness of breath. 01/02/21  Yes Michela Pitcher, NP  albuterol (VENTOLIN HFA) 108 (90 Base) MCG/ACT inhaler Inhale 2 puffs into the lungs every 6 (six) hours as needed for wheezing or shortness of breath. 10/17/20  Yes Martyn Ehrich, NP  amoxicillin (AMOXIL) 500 MG capsule Take 4 capsules (2,000 mg total) by mouth as needed (Take 1 hr before dental appointment.). 07/06/21  Yes   apixaban (ELIQUIS) 5 MG TABS tablet TAKE 1 TABLET BY MOUTH TWICE DAILY Patient taking differently: Take 5 mg by mouth 2 (two) times daily. 01/25/21 01/25/22 Yes [name], MD  beclomethasone (QVAR) 80 MCG/ACT inhaler Inhale 2 puffs into the lungs 2 (two) times daily. 10/17/20  Yes Martyn Ehrich, NP  cholecalciferol (VITAMIN D3) 25 MCG (1000 UNIT) tablet Take 1,000 Units by mouth at bedtime.   Yes [provider]  colchicine 0.6 MG tablet Take 1 tablet (0.6 mg total) by mouth every other day 06/11/21  Yes   dapagliflozin propanediol (FARXIGA) 10 MG TABS tablet Take 1 tablet (10  mg total) by mouth daily before breakfast. 08/01/21  Yes Milford, Jessica M, FNP  fluticasone (FLONASE) 50 MCG/ACT nasal spray PLACE 2 SPRAYS INTO BOTH NOSTRILS DAILY. 08/03/21 08/03/22 Yes [name], MD  gabapentin (NEURONTIN) 300 MG capsule TAKE 1 CAPSULE BY MOUTH TWICE DAILY 05/22/21  Yes   insulin glargine-yfgn (SEMGLEE) 100 UNIT/ML Pen Inject 40 Units subcutaneously once daily Patient taking differently: Inject 40 Units into the skin at bedtime. 05/28/21  Yes   iron polysaccharides (FERREX 150) 150 MG capsule TAKE 1 CAPSULE BY MOUTH 2 TIMES DAILY. Patient taking differently: Take 150 mg by mouth 2 (two) times daily. 06/22/21  Yes [name], MD  losartan (COZAAR) 25 MG tablet Take 1 tablet (25 mg total) by mouth daily.  08/01/21  Yes Milford, Maricela Bo, FNP  metolazone (ZAROXOLYN) 2.5 MG tablet Take 1 tablet by mouth 2 days per week Patient taking differently: Take 2.5 mg by mouth 2 (two) times a week. Tuesday and Friday 08/01/21  Yes Milford, Jessica M, FNP  montelukast (SINGULAIR) 10 MG tablet TAKE 1 TABLET BY MOUTH AT BEDTIME. Patient taking differently: Take 10 mg by mouth at bedtime. 10/17/20 10/17/21 Yes Martyn Ehrich, NP  Multiple Vitamin (MULTIVITAMIN WITH MINERALS) TABS tablet Take 1 tablet by mouth daily.   Yes [provider]  mycophenolate (MYFORTIC) 180 MG EC tablet Take 360 mg by mouth 2 (two) times daily.   Yes [provider]  omeprazole (PRILOSEC) 20 MG capsule TAKE 1 CAPSULE BY MOUTH DAILY. Patient taking differently: Take 20 mg by mouth at bedtime. 12/28/20 12/28/21 Yes [name], MD  Potassium Chloride CR (MICRO-K) 8 MEQ CPCR capsule CR Take 10 capsules (80 mEq total) by mouth 2 (two) times daily. 08/01/21  Yes Milford, Maricela Bo, FNP  predniSONE (DELTASONE) 5 MG tablet Take 1 tablet (5 mg total) by mouth daily. 07/25/21  Yes   rosuvastatin (CRESTOR) 10 MG tablet Take 1 tablet (10 mg total) by mouth daily. Patient taking differently: Take 10 mg by mouth at bedtime. 07/25/21  Yes [name], MD  spironolactone (ALDACTONE) 25 MG tablet Take 1 tablet (25 mg total) by mouth daily. 08/01/21  Yes Milford, Maricela Bo, FNP  sulfamethoxazole-trimethoprim (BACTRIM) 400-80 MG tablet Take 1 tablet by mouth 3 (three) times a week. Monday, Wednesday, Friday   Yes [provider]  tacrolimus (PROGRAF) 1 MG capsule Take 2 capsules (2 mg total) by mouth 2 times daily. 04/30/21  Yes   tadalafil, PAH, (ADCIRCA) 20 MG tablet TAKE 1 TABLET (20 MG TOTAL) BY MOUTH EVERY OTHER DAY. Patient taking differently: Take 20 mg by mouth every other day. 12/01/20 12/01/21 Yes [name], MD  tamsulosin (FLOMAX) 0.4 MG CAPS capsule TAKE 1 CAPSULE BY MOUTH DAILY. Patient taking differently:  Take 0.4 mg by mouth daily after supper. 05/28/21  Yes   torsemide (DEMADEX) 100 MG tablet Take 1 tablet (100 mg total) by mouth 2 (two) times daily. 08/01/21  Yes Milford, Maricela Bo, FNP  vitamin C (ASCORBIC ACID) 250 MG tablet Take 1 tablet (250 mg total) by mouth daily. 07/04/19  Yes Max Sane, MD  zinc sulfate 220 (50 Zn) MG capsule Take 220 mg by mouth daily.   Yes [provider]  buPROPion (WELLBUTRIN SR) 150 MG 12 hr tablet TAKE 1 TABLET BY MOUTH TWICE DAILY Patient taking differently: Take 150 mg by mouth 2 (two) times daily. 05/29/21 05/29/22  Kennyth Arnold, FNP  cephALEXin (KEFLEX) 500 MG capsule Take  1 capsule (500 mg total) by mouth 3 (three) times daily. Patient not taking: Reported on 08/15/2021 06/28/21   Copland, Frederico Hamman, MD  Continuous Blood Gluc Sensor (FREESTYLE LIBRE 3 SENSOR) MISC Use one every 2 weeks. 05/28/21     Cyanocobalamin (B-12 PO) Take by mouth daily. Patient not taking: Reported on 08/15/2021    [provider]  Febuxostat 80 MG TABS Take 1 tablet (80 mg total) by mouth once daily for 180 days Patient not taking: Reported on 08/15/2021 02/23/21     Insulin Pen Needle (UNIFINE PENTIPS) 31G X 5 MM MISC Use 4 times daily as directed 05/28/21       Past Medical History: Past Medical History:  Diagnosis Date   Asthma, persistent controlled 02/25/2013   Attention or concentration deficit 04/26/2021   BPH with obstruction/lower urinary tract symptoms 09/25/2014   CAD (coronary artery disease) 2005   a.  Status post four-vessel CABG in 2005; b. 03/2018 MV: Small, fixed inferoapical and apical septal defect. No ischemia. EF 47% (60-65% by 05/2018 Echo).   Cellulitis and abscess of left leg 07/22/2020   Chronic atrial fibrillation    a. CHADS2VASc 4 (CHF, HTN, DM, vascular disease)--> Eliquis; b. 09/2018 Zio: Chronic Afib.   Chronic combined systolic and diastolic CHF (congestive heart failure)    a.  7/18 Echo: EF 45-50%; b. 05/2018 Echo: EF 60-65%, RVSP 74.8; c.  12/2018 Echo: EF 50-55%. Sev dil LA; d. 04/2019 Echo: EF 45-50%, glob HK. Mod reduced RV fxn. RVSP 53.21mHg.   Chronic venous stasis dermatitis of both lower extremities    Cytomegaloviral disease 2017   Diverticulosis of colon 04/22/2013   Essential hypertension 12/17/2010   GERD (gastroesophageal reflux disease)    Gout attack 03/30/2018   History of renal transplant 03/01/2013   Hyperlipidemia    low since renal failure   Hypogonadism in male 09/25/2014   Major depressive disorder 10/04/2013   Obesity hypoventilation syndrome 03/31/2012   Obstructive sleep apnea 09/29/2012   NPSG [place] 03/15/12- AHI 57.9/ hr, desaturation to 78.8%, body weight 310 lbs   PAH (pulmonary artery hypertension)    a. 05/2018 Echo: RVSP 74.884mg; b. 12/2018 RV not well visualized; c. 04/2019 RHC: RA 19, RV 80/20, PA 78/31 (51), PCWP 25, CO/CI 7.69/3.26. PVR 3.25 WU-->Sev PAH, likely primarily PV HTN.   Synovitis of finger 06/11/2019   Trigger finger, unspecified little finger 12/23/2018   Type 2 diabetes mellitus with hyperglycemia, with long-term current use of insulin 09/09/2014   Ulcer 05/2016   Left shin   Wears glasses     Past Surgical History: Past Surgical History:  Procedure Laterality Date   BARIATRIC SURGERY     [facility]  [facility]   CATARACT EXTRACTION W/PHACO Right 10/29/2017   Procedure: CATARACT EXTRACTION PHACO AND INTRAOCULAR LENS PLACEMENT ([place] DIABETIC;  Surgeon: BrLeandrew KoyanagiMD;  [place] Service: Ophthalmology;  Laterality: Right;  Diabetic - insulin   CATARACT EXTRACTION W/PHACO Left 11/18/2017   Procedure: CATARACT EXTRACTION PHACO AND INTRAOCULAR LENS PLACEMENT [place];  Surgeon: BrLeandrew KoyanagiMD;  [facility] [place]: [place]hthal[place];  Laterality: Left;  DIABETES - insulin sleep apnea   COLONOSCOPY WITH PROPOFOL N/A 04/22/2013   Procedure:  COLONOSCOPY WITH PROPOFOL;  Surgeon: DaMilus BanisterMD;  Location: [facility];  Service: Endoscopy;  Laterality: N/A;   CORONARY ARTERY BYPASS GRAFT  2005   X 4  DG ANGIO AV SHUNT*L*     right and left upper arms   FASCIOTOMY  03/03/2012   Procedure: FASCIOTOMY;  Surgeon: [name], MD;  Location: [place];  Service: Orthopedics;  Laterality: Right;  FASCIOTOMY RIGHT SMALL FINGER   FASCIOTOMY Left 08/17/2013   Procedure: FASCIOTOMY LEFT RING;  Surgeon: [name], MD;  Location: [place];  Service: Orthopedics;  Laterality: Left;   INCISION AND DRAINAGE ABSCESS Left 10/15/2015   Procedure: INCISION AND DRAINAGE ABSCESS;  Surgeon: [name], MD;  Location: [facility] ORS;  Service: General;  Laterality: Left;   KIDNEY TRANSPLANT  09/13/2010   cadaver--at [facility] HEART CATH N/A 11/15/2016   Procedure: RIGHT HEART CATH;  Surgeon: [name], MD;  Location: [place] CV LAB;  Service: Cardiovascular;  Laterality: N/A;   RIGHT HEART CATH N/A 05/03/2019   Procedure: RIGHT HEART CATH;  Surgeon: [name], MD;  Location: [place] CV LAB;  Service: Cardiovascular;  Laterality: N/A;   TYMPANIC MEMBRANE REPAIR  1/12   left   VASECTOMY      Family History: Family History  Problem Relation Age of Onset   Heart disease Father    Kidney failure Father    Kidney disease Father    Diabetes Maternal Grandmother    Breast cancer Maternal Grandmother    Valvular heart disease Mother    Liver cancer Paternal Uncle    Liver cancer Paternal Grandmother    Prostate cancer Neg Hx     Social History: Social History   Socioeconomic History   Marital status: Married    Spouse name: Not on file   Number of children: 2   Years of education: 14   Highest education level: Associate degree: academic program  Occupational History   Occupation: Warden/ranger: unemployed    Comment: disabled due to kidney failure   Tobacco Use   Smoking status: Former    Types: Cigars    Quit date: 09/02/1994    Years since quitting: 26.9   Smokeless tobacco: Never   Tobacco comments:    Occasional, rare cigars  Vaping Use   Vaping Use: Never used  Substance and Sexual Activity   Alcohol use: Yes    Alcohol/week: 0.0 - 1.0 standard drinks    Comment: occasional - 12-pack lasts a month   Drug use: No   Sexual activity: Not on file  Other Topics Concern   Not on file  Social History Narrative   In school for culinary arts      Has living will   Wife is health care POA   Would accept resuscitation attempts   Hasn't considered tube feedings   Social Determinants of Health   Financial Resource Strain: Not on file  Food Insecurity: Not on file  Transportation Needs: Not on file  Physical Activity: Not on file  Stress: Not on file  Social Connections: Not on file    Allergies:  Allergies  Allergen Reactions   Contrast Media [Iodinated Contrast Media] Other (See Comments)    Kidney transplant   Ibuprofen Other (See Comments)    Due to kidney transplant Due to kidney transplant Due to kidney transplant    Objective:    Vital Signs:   Temp:  [97.2 F (36.2 C)-98.6 F (37 C)] 98.6 F (37 C) (05/25 0758) Pulse Rate:  [59-78] 66 (05/25 0758) Resp:  [15-21] 16 (05/25 0758) BP: (132-160)/(70-94) 141/71 (  05/25 0758) SpO2:  [91 %-97 %] 91 % (05/25 0343) Weight:  [122.4 kg-124.3 kg] 122.4 kg (05/25 0500)    Weight change: Filed Weights   08/15/21 1122 08/16/21 0500  Weight: 124.3 kg 122.4 kg    Intake/Output:   Intake/Output Summary (Last 24 hours) at 08/16/2021 1005 Last data filed at 08/16/2021 0900 Gross per 24 hour  Intake 240 ml  Output 750 ml  Net -510 ml      Physical Exam    General:  Well appearing, obese, sitting up in chair. No resp difficulty HEENT: normal Neck: supple. JVP 12 cm . Carotids 2+ bilat; no bruits. No lymphadenopathy or thyromegaly appreciated. Cor: PMI  nondisplaced. Irregularly irregular rhythm and rate. No rubs, gallops or murmurs. Lungs: decreased BS at the bases bilaterally, no wheezing Abdomen: obese, milady distended, soft, nontender. No hepatosplenomegaly. No bruits or masses. Good bowel sounds. Extremities: no cyanosis, clubbing, rash, trace b/l LE edema w/ chronic venous stasis dermatitis  Neuro: alert & orientedx3, cranial nerves grossly intact. moves all 4 extremities w/o difficulty. Affect pleasant   Telemetry   Afib 70s   EKG    AF 68 bpm Nonspecific IV block, QRS 142 ms   Labs   Basic Metabolic Panel: Recent Labs  Lab 08/15/21 1159 08/15/21 1412 08/15/21 1843 08/16/21 0249  NA 142 143 141 140  K 6.0* 7.2* 6.1* 5.0  CL 107 109 107 107  CO2 29  --  26 27  GLUCOSE 71 131* 164* 132*  BUN 51* 51* 51* 49*  CREATININE 2.23* 2.40* 2.35* 2.22*  CALCIUM 9.2  --  9.5 9.5    Liver Function Tests: Recent Labs  Lab 08/15/21 1159  AST 30  ALT 18  ALKPHOS 133*  BILITOT 0.9  PROT 7.4  ALBUMIN 3.8   No results for input(s): LIPASE, AMYLASE in the last 168 hours. No results for input(s): AMMONIA in the last 168 hours.  CBC: Recent Labs  Lab 08/15/21 1159 08/15/21 1412 08/16/21 0249  WBC 4.5  --  5.5  NEUTROABS 2.8  --   --   HGB 13.3 13.6 12.5*  HCT 43.0 40.0 39.4  MCV 87.4  --  86.8  PLT 121*  --  93*    Cardiac Enzymes: No results for input(s): CKTOTAL, CKMB, CKMBINDEX, TROPONINI in the last 168 hours.  BNP: BNP (last 3 results) Recent Labs    10/30/20 1217 08/01/21 0922 08/15/21 1159  BNP 171.2* 228.3* 259.3*    ProBNP (last 3 results) No results for input(s): PROBNP in the last 8760 hours.   CBG: Recent Labs  Lab 08/15/21 1743 08/15/21 1817 08/15/21 2104 08/16/21 0603  GLUCAP 109* 139* 154* 100*    Coagulation Studies: No results for input(s): LABPROT, INR in the last 72 hours.   Imaging   DG Chest 2 View  Result Date: 08/15/2021 CLINICAL DATA:  Short of breath EXAM:  CHEST - 2 VIEW COMPARISON:  07/12/2019 FINDINGS: Prior CABG. Cardiac enlargement. Mild vascular congestion. Negative for pulmonary edema or pleural effusion. Negative for pneumonia. Calcified right paratracheal lymph node unchanged. IMPRESSION: Cardiac enlargement with mild vascular congestion. Negative for edema or effusion. Electronically Signed   By: Franchot Gallo M.D.   On: 08/15/2021 11:42   US Renal Transplant w/Doppler  Result Date: 08/15/2021 CLINICAL DATA:  [id number]. Status post renal transplantation. Acute renal failure. EXAM: ULTRASOUND OF RENAL TRANSPLANT WITH RENAL DOPPLER ULTRASOUND TECHNIQUE: Ultrasound examination of the renal transplant was performed with gray-scale, color and duplex doppler  evaluation. COMPARISON:  07/16/2019 FINDINGS: Transplant kidney location: Right lower quadrant Transplant Kidney: Renal measurements: 11.2 x 5.0 x 5.4 cm = volume: 152m. Renal cortical echogenicity is normal and cortical thickness is preserved. There has developed mild hydronephrosis. No intrarenal masses or calcifications are seen. No perinephric fluid collections are identified. Color flow in the main renal artery:  Yes Color flow in the main renal vein:  Yes Duplex Doppler Evaluation: Main Renal Artery Velocity: 55 cm/sec Main Renal Artery Resistive Index: 0.86 Venous waveform in main renal vein:  Present Intrarenal resistive index in upper pole:  0.75 (normal 0.6-0.8; equivocal 0.8-0.9; abnormal >= 0.9) Intrarenal resistive index in lower pole: 0.66 (normal 0.6-0.8; equivocal 0.8-0.9; abnormal >= 0.9) Bladder: Normal for degree of bladder distention. Other findings:  None. IMPRESSION: Interval development of mild hydronephrosis. Slightly elevated resistive indices within the main right renal artery this is equivocal given the normal resistive indices at the corticomedullary junctions bilaterally, but can be seen in the setting of ureteric obstruction (given the development of mild hydronephrosis) as well  as transplant rejection or drug toxicity. Electronically Signed   By: AFidela SalisburyM.D.   On: 08/15/2021 21:15     Medications:     Current Medications:  apixaban  5 mg Oral BID   budesonide (PULMICORT) nebulizer solution  0.25 mg Nebulization BID   buPROPion  150 mg Oral BID   colchicine  0.6 mg Oral QODAY   fluticasone  2 spray Each Nare Daily   furosemide  80 mg Intravenous Q8H   gabapentin  300 mg Oral BID   insulin aspart  0-15 Units Subcutaneous TID WC   insulin aspart  0-5 Units Subcutaneous QHS   insulin glargine-yfgn  40 Units Subcutaneous QHS   montelukast  10 mg Oral QHS   mycophenolate  360 mg Oral BID   pantoprazole  40 mg Oral Daily   predniSONE  5 mg Oral Daily   rosuvastatin  10 mg Oral Daily   sodium chloride flush  3 mL Intravenous Q12H   [START ON 08/17/2021] sulfamethoxazole-trimethoprim  1 tablet Oral Once per day on Mon Wed Fri   tacrolimus  2 mg Oral BID   tadalafil  20 mg Oral QODAY   tamsulosin  0.4 mg Oral Daily    Infusions:     Patient Profile   61y/o male w/ chronic systolic heart failure w/ biventricular dysfunction EF 40-45%, RV mild-mod reduced, PH, OSA, obesity, Stage III CKD s/p renal transplant in 2012, chronic atrial fibrillation , HTN and type 2DM admitted for a/c systolic heart failure and AKI on CKD.   Assessment/Plan    1. Acute on Chronic Heart failure with Mid Range EF/ With prominent RV failure/pulmonary hypertension:  Ischemic cardiomyopathy.  ?If RV failure is related to severe OSA, he was a remote smoker.  Echo in 2/21 with EF 45-50%, moderately dilated RV with moderately decreased systolic function, moderate pulmonary hypertension. Admitted w/ volume overload and progression to NYHA class III symptoms. Scheduled weekly Metolazone recently discontinued in setting worsening renal function. Echo this admit EF 40-45%, RV mildly reduced, GIIIDD (restrictive), IVC dilated. Diuresing well w/ IV Lasix. Wt and SCr both trending down.  Nephrology dosing diuretics. Remains fluid overloaded today - Continue IV Lasix 80 mg bid  - Home dapagliflozin, Spiro and losartan on hold w/ recent bump in SCr (2.2, prior BL ~1.7) - Add low dose hydralazine 12.5 mg tid for afterload reduction  - no Imdur given tadalafil use for PH  -  Will need RHC prior to d/c  - Strict I/Os ordered. Have asked nursing staff to start charting  - Unable to confirm that his insurance will cover Cardiomems so will manage him without this.   2. Atrial fibrillation: Chronic. Rate controlled.  - Continue Eliquis. No bleeding issues. - Not on beta blocker with history of bradycardia.   3. CAD: S/p CABG 2005.  Low risk Cardiolite in 1/20.   - No chest pain.  - No ASA given Eliquis use.  - Continue Crestor 10 mg daily, good lipids 2/23. 4. AKI on CKD Stage 3: S/p renal transplantation in 2012. Followed by [facility] center [facility]. He remains on mycophenolate and tacrolimus.  Recent bump in BL SCr from 1.7>>2.2 range. Admission SCr 2.4>>2.2 today - Nephrology following - follow BMP w/ diuresis  - keep off SLGT2i, spiro and losartan for now - avoid hypotension  - plan RHC after diuresis  5. OSA: Continue Bipap every night.  6. HTN: Mildly elevated today. He reports SBPs persistently in the 140s at home - add hydralazine 12.5 mg tid  7. DM 2: A1c 7.6. - Hold dapagliflozin w/ AKI. - insulin per primary team  8. Pulmonary hypertension: Severe pulmonary hypertension by 3/20 echo.  RHC in 2/21 with moderate, primarily pulmonary venous hypertension (PVR 3.25 WU). Echo this admit w/ mildly reduced RV. RVSP not estimated. Suspect that there is also a component of group 3 PH (OSA, ? unrecognized OHS).  - Continue tadalafil - Diuresis per above - Plan repeat RHC after diuresis  9. Hyperkalemia: K 7.2 on admit, in setting of AKI on CKD. Also taking KCl supp at home. Corrected w/ Lokelma, insulin, albuterol and IV Lasix. K 5.0 today  - keep off Arlyce Harman and  losartan - follow BMP closely   Length of Stay: 1  Carl Simmons, PA-C  08/16/2021, 10:05 AM  Advanced Heart Failure Team Pager [phone] (M-F; 7a - 5p)  Please contact [name] for night-coverage after hours (4p -7a ) and weekends on [email or web address]  Patient seen with PA, agree with the above note.   Patient was admitted with acute on chronic systolic CHF, AKI, and hyperkalemia.  He has been diuresed with IV Lasix with good response so far.  Creatinine is beginning to trend down and K has been corrected.  Cause of exacerbation is uncertain, no new medications and he has been compliant with his regimen.   Echo was stable with EF 40-45%, moderate LVH, mildly decreased RV function, mild MR, dilated IVC.   General: NAD Neck: JVP 16 cm, no thyromegaly or thyroid nodule.  Lungs: Clear to auscultation bilaterally with normal respiratory effort. CV: Nondisplaced PMI.  Heart irregular S1/S2, no S3/S4, no murmur.  Trace ankle edema.  Abdomen: Soft, nontender, no hepatosplenomegaly, no distention.  Skin: Intact without lesions or rashes.  Neurologic: Alert and oriented x 3.  Psych: Normal affect. Extremities: No clubbing or cyanosis.  HEENT: Normal.   Patient remains volume overloaded on exam, he is diuresing well so far with IV Lasix. Creatinine trending down.   - Continue Lasix 80 mg IV bid.  - He will stay off losartan and spironolactone with AKI/hyperkalemia.  - Hope to restart Farxiga.   - Hydralazine 12.5 mg tid.  - nephrology following with renal transplant.  - RHC eventually after diuresis.   Atrial fibrillation is chronic, rate is controlled.  Continue apixaban.   Cardiolite in 12/22 with prior MI, no ischemia.  No chest pain.   [name]  08/16/2021 3:25 PM

## 2021-08-16 NOTE — Progress Notes (Signed)
Nutrition Education Note  RD consulted for nutrition education regarding weight loss and CHF.   RD provided "Low Sodium Nutrition Therapy" and "Tips to Support Weight Loss" handouts from the [employer] of [employer]. Reviewed patient's dietary recall. Provided examples on ways to decrease sodium intake in diet. Discouraged intake of processed foods and use of salt shaker. Encouraged fresh fruits and vegetables as well as whole grain sources of carbohydrates to maximize fiber intake. Patient states that he mostly eats salads. He does not add salt to anything and avoids processed foods. He has been trying to eat better and walk every day for 2.5 months, but he has not lost any weight. We discussed that his weight fluctuates with fluids and he may actually be losing dry weight, but it's hard to tell. Encouraged patient to continue with the healthy changes he has made to his diet.   RD discussed why it is important for patient to adhere to diet recommendations, and emphasized the role of fluids, foods to avoid, and importance of weighing self daily. Teach back method used.  Expect good compliance.  Body mass index is 43.56 kg/m. Pt meets criteria for class 3, extreme/morbid obesity based on current BMI.  Current diet order is heart healthy carb modified, patient is consuming approximately 100% of meals at this time. Labs and medications reviewed. No further nutrition interventions warranted at this time. RD contact information provided. If additional nutrition issues arise, please re-consult RD.   [name] RD, LDN, CNSC Please refer to Amion for contact information.

## 2021-08-16 NOTE — Progress Notes (Signed)
PT Cancellation & Discharge Note  Patient Details Name: [name]. MRN: [id number] DOB: [birth year]   Cancelled Treatment:    Reason Eval/Treat Not Completed: PT screened, no needs identified, will sign off. Communicated with OT who reported pt is mobilizing IND without need for an AD with no apparent balance deficits. PT will sign off.   Moishe Spice, PT, DPT Acute Rehabilitation Services  Pager: [phone] Office: [place] 08/16/2021, 1:05 PM

## 2021-08-17 ENCOUNTER — Other Ambulatory Visit (HOSPITAL_COMMUNITY): Payer: Self-pay

## 2021-08-17 DIAGNOSIS — I5023 Acute on chronic systolic (congestive) heart failure: Secondary | ICD-10-CM | POA: Diagnosis not present

## 2021-08-17 LAB — BASIC METABOLIC PANEL
Anion gap: 8 (ref 5–15)
BUN: 57 mg/dL — ABNORMAL HIGH (ref 8–23)
CO2: 29 mmol/L (ref 22–32)
Calcium: 9.3 mg/dL (ref 8.9–10.3)
Chloride: 103 mmol/L (ref 98–111)
Creatinine, Ser: 2.24 mg/dL — ABNORMAL HIGH (ref 0.61–1.24)
GFR, Estimated: 33 mL/min — ABNORMAL LOW (ref >60)
Glucose, Bld: 153 mg/dL — ABNORMAL HIGH (ref 70–99)
Potassium: 4.1 mmol/L (ref 3.5–5.1)
Sodium: 140 mmol/L (ref 135–145)

## 2021-08-17 LAB — GLUCOSE, CAPILLARY
Glucose-Capillary: 84 mg/dL (ref 70–99)
Glucose-Capillary: 196 mg/dL — ABNORMAL HIGH (ref 70–99)

## 2021-08-17 MED ORDER — TORSEMIDE 20 MG PO TABS
100.0000 mg | ORAL_TABLET | Freq: Two times a day (BID) | ORAL | Status: DC
  Administered 2021-08-17: 100 mg via ORAL
  Filled 2021-08-17: qty 5

## 2021-08-17 MED ORDER — HYDRALAZINE HCL 25 MG PO TABS
12.5000 mg | ORAL_TABLET | Freq: Three times a day (TID) | ORAL | 0 refills | Status: DC
  Filled 2021-08-17: qty 45, 30d supply, fill #0

## 2021-08-17 NOTE — Plan of Care (Signed)
  Problem: Education: Goal: Ability to demonstrate management of disease process will improve Outcome: Adequate for Discharge Goal: Ability to verbalize understanding of medication therapies will improve Outcome: Adequate for Discharge Goal: Individualized Educational Video(s) Outcome: Adequate for Discharge   Problem: Activity: Goal: Capacity to carry out activities will improve Outcome: Adequate for Discharge   Problem: Cardiac: Goal: Ability to achieve and maintain adequate cardiopulmonary perfusion will improve Outcome: Adequate for Discharge   Problem: Clinical Measurements: Goal: Ability to maintain clinical measurements within normal limits will improve Outcome: Adequate for Discharge Goal: Will remain free from infection Outcome: Adequate for Discharge Goal: Diagnostic test results will improve Outcome: Adequate for Discharge Goal: Respiratory complications will improve Outcome: Adequate for Discharge Goal: Cardiovascular complication will be avoided Outcome: Adequate for Discharge   Problem: Activity: Goal: Risk for activity intolerance will decrease Outcome: Adequate for Discharge   Problem: Nutrition: Goal: Adequate nutrition will be maintained Outcome: Adequate for Discharge   Problem: Elimination: Goal: Will not experience complications related to bowel motility Outcome: Adequate for Discharge Goal: Will not experience complications related to urinary retention Outcome: Adequate for Discharge   Problem: Coping: Goal: Level of anxiety will decrease Outcome: Adequate for Discharge   Problem: Safety: Goal: Ability to remain free from injury will improve Outcome: Adequate for Discharge   Problem: Skin Integrity: Goal: Risk for impaired skin integrity will decrease Outcome: Adequate for Discharge

## 2021-08-17 NOTE — Progress Notes (Addendum)
Inpatient Diabetes Program Recommendations  AACE/ADA: New Consensus Statement on Inpatient Glycemic Control (2015)  Target Ranges:  Prepandial:   less than 140 mg/dL      Peak postprandial:   less than 180 mg/dL (1-2 hours)      Critically ill patients:  140 - 180 mg/dL    Latest Reference Range & Units 08/16/21 06:03 08/16/21 11:12 08/16/21 16:03 08/16/21 21:36  Glucose-Capillary 70 - 99 mg/dL 100 (H) 170 (H)  3 units Novolog  166 (H)  3 units Novolog  267 (H)  3 units Novolog  40 units Semglee  (H): Data is abnormally high  Latest Reference Range & Units 08/17/21 06:02  Glucose-Capillary 70 - 99 mg/dL 84      Home DM Meds: Semglee 40 units QHS       Farxiga 10 mg Daily  Current Orders: Semglee 40 units QHS      Novolog Moderate Correction Scale/ SSI (0-15 units) TID AC + HS     MD- Note CBG only 84 this AM after receiving 40 units Semglee last PM  Please consider reducing the Semglee to 35 units QHS (~15% reduction)    --Will follow patient during hospitalization--  Wyn Quaker RN, MSN, CDE Diabetes Coordinator Inpatient Glycemic Control Team Team Pager: [phone] (8a-5p)

## 2021-08-17 NOTE — Discharge Summary (Signed)
PatientPhysician Discharge Summary  [name]. IOX:[id number] DOB: 1961-03-22 DOA: 08/15/2021  PCP: [name], MD  Admit date: 08/15/2021 Discharge date: 08/17/2021 30 Day Unplanned Readmission Risk Score    Flowsheet Row ED to Hosp-Admission (Current) from 08/15/2021 in [facility] 4E CV SURGICAL PROGRESSIVE CARE  30 Day Unplanned Readmission Risk Score (%) 27.84 Filed at 08/17/2021 1200       This score is the patient's risk of an unplanned readmission within 30 days of being discharged (0 -100%). The score is based on dignosis, age, lab data, medications, orders, and past utilization.   Low:  0-14.9   Medium: 15-21.9   High: 22-29.9   Extreme: 30 and above          Admitted From: Home Disposition: Home  Recommendations for Outpatient Follow-up:  Follow up with PCP in 1-2 weeks Please obtain BMP/CBC in one week Follow-up with cardiology/heart failure clinic in 1 week Please follow up with your PCP on the following pending results: Unresulted Labs (From admission, onward)     Start     Ordered   08/18/21 0500  Renal function panel  Daily,   R      08/17/21 0730              Home Health: None Equipment/Devices: None  Discharge Condition: Stable CODE STATUS: Full code Diet recommendation: Cardiac/low-sodium  Subjective: Seen and examined.  No complaints.  No shortness of breath.  Eager to go home.  Brief/Interim Summary: Reason [name]. is a 61 y.o. male with medical history significant of asthma; BPH;  CAD s/p CABG; afib; chronic combined CHF; stasis dermatitis; s/p renal transplant (2012); HTN; HLD; morbid obesity with OHS/OSA; pulm HTN; and DM presented with SOB. He noticed his weight creeping up and he took metolazone yesterday and it didn't help so he took a toresemide.  He took some more torsemide and it still didn't seem to work.  +orthopnea.  His potassium was low and so they told him to "take a couple of pills more a day."  He started  taking 4-5 extra a day and was planning to get blood work on the day of admission. He usually walks 1.5 miles a per and yesterday he could barely make it 1/4 mile.  His abdomen usually gets bigger with fluid.   He called his CHF clinic this AM, out in the garden - his BP was elevated and she told him to come in.   He lives in [place] and they advised him to come to [facility].  Patient was admitted with the following issues, detailed hospitalization as below.  Acute on chronic combined systolic and diastolic CHF/pulmonary hypertension: Chest x-ray with vascular congestion but BNP within normal limits.  Echo in 2/21 with EF 45-50%, moderately dilated RV with moderately decreased systolic function, moderate pulmonary hypertension. Admitted w/ volume overload and progression to NYHA class III symptoms. Scheduled weekly Metolazone recently discontinued in setting worsening renal function. Echo this admit EF 40-45%, RV mildly reduced, GIIIDD (restrictive), IVC dilated.  Started on IV Lasix and diuresed very well. Wt and SCr both trended down.  Home dapagliflozin, Spiro and losartan on hold w/ recent bump in SCr (2.2, prior BL ~1.7).  Low-dose hydralazine 12.5 mg 3 times daily added by cardiology for afterload reduction, no Imdur given tadalafil use for pulmonary hypertension.  Per cardiology, the initial plan was to do right heart cath but today they changed their plans and now patient is feeling well and  since there will be no change in the management after right heart cath, they have cleared the patient for discharge and patient is eager to go home and meet his sister who is visiting him after several years.  Holding his [place] Glade, losartan and Aldactone at discharge and resuming back on metolazone and torsemide per cardiology recommendations.   Hyperkalemia -Patient was prescribed a very high dose of KCl (80 mEq BID) and then also took additional doses per his doctor's recommendations.  Came in with significant  hyperkalemia, given calcium gluconate, albuterol, Lasix and insulin/glucose as well as IV fluid and Lokelma in the ED, potassium normal today.   Diarrhea Resolved.   AKI on CKD stage IIIb: Baseline creatinine around 1.7.  Creatinine elevated but stable and nephrology has cleared him for discharge.   S/p renal transplant: Doppler renal ultrasound shows mild right hydronephrosis.  Patient on mycophenolate, prednisone and tacrolimus.  Management per nephrology.   HTN: Controlled.   HLD -Continue Lipitor   DM type II: Resume home medications.   Pulmonary HTN -Continue tadalafil   Afib: Rate controlled.  Continue Eliquis.   CAD -s/p CABG   Asthma -Controlled.  Continue Albuterol, Qvar (budesonide per pharmacy), Singulair   Morbid obesity -Body mass index is 44.22 kg/m..  -Weight loss encouraged and counseled. -Outpatient PCP/bariatric medicine/bariatric surgery f/u encouraged    Mood d/o -Continue bupropion   Gout -Continue colchicine, febuxostat  Discharge plan was discussed with patient and/or family member and they verbalized understanding and agreed with it.  Discharge Diagnoses:  Principal Problem:   Acute on chronic systolic CHF (congestive heart failure) (HCC) Active Problems:   Hyperlipidemia   Asthma, persistent controlled   History of renal transplant   Major depressive disorder   Chronic atrial fibrillation   Essential hypertension   Type 2 diabetes mellitus with hyperglycemia, with long-term current use of insulin   Pulmonary hypertension due to left heart disease   Hyperkalemia, diminished renal excretion   Diarrhea   Chronic kidney disease, stage 3b ([place])    Discharge Instructions   Allergies as of 08/17/2021       Reactions   Contrast Media [iodinated Contrast Media] Other (See Comments)   Kidney transplant   Ibuprofen Other (See Comments)   Due to kidney transplant Due to kidney transplant Due to kidney transplant        Medication  List     STOP taking these medications    B-12 PO   cephALEXin 500 MG capsule Commonly known as: KEFLEX   dapagliflozin propanediol 10 MG Tabs tablet Commonly known as: Farxiga   Febuxostat 80 MG Tabs   losartan 25 MG tablet Commonly known as: COZAAR   spironolactone 25 MG tablet Commonly known as: ALDACTONE       TAKE these medications    albuterol 108 (90 Base) MCG/ACT inhaler Commonly known as: VENTOLIN HFA Inhale 2 puffs into the lungs every 6 (six) hours as needed for wheezing or shortness of breath.   albuterol (2.5 MG/3ML) 0.083% nebulizer solution Commonly known as: PROVENTIL Inhale one vial (3 ml) via nebulizer every 6 hours as needed for wheezing or shortness of breath (Take 3 mLs (2.5 mg total) by nebulization every 6 (six) hours as needed for wheezing or shortness of breath.)   amoxicillin 500 MG capsule Commonly known as: AMOXIL Take 4 capsules (2047m) by mouth one hour before dnetal appointment as needed (Take 4 capsules (2,000 mg total) by mouth as needed (Take 1 hr before dental appointment.).)  beclomethasone 80 MCG/ACT inhaler Commonly known as: QVAR Inhale 2 puffs into the lungs 2 (two) times daily.   buPROPion 150 MG 12 hr tablet Commonly known as: WELLBUTRIN SR TAKE 1 TABLET BY MOUTH TWICE DAILY What changed: how much to take   cholecalciferol 25 MCG (1000 UNIT) tablet Commonly known as: VITAMIN D3 Take 1,000 Units by mouth at bedtime.   colchicine 0.6 MG tablet Take 1 tablet (0.6 mg total) by mouth every other day   Eliquis 5 MG Tabs tablet Generic drug: apixaban TAKE 1 TABLET BY MOUTH TWICE DAILY What changed: how much to take   Ferrex 150 150 MG capsule Generic drug: iron polysaccharides TAKE 1 CAPSULE BY MOUTH 2 TIMES DAILY. What changed:  how much to take how to take this when to take this   fluticasone 50 MCG/ACT nasal spray Commonly known as: FLONASE PLACE 2 SPRAYS INTO BOTH NOSTRILS DAILY.   FreeStyle Libre 3  Sensor Misc Use one every 2 weeks.   gabapentin 300 MG capsule Commonly known as: NEURONTIN TAKE 1 CAPSULE BY MOUTH TWICE DAILY   hydrALAZINE 25 MG tablet Commonly known as: APRESOLINE Take 0.5 tablets (12.5 mg total) by mouth every 8 (eight) hours.   metolazone 2.5 MG tablet Commonly known as: ZAROXOLYN Take 1 tablet by mouth 2 days per week What changed:  how much to take how to take this when to take this additional instructions   montelukast 10 MG tablet Commonly known as: SINGULAIR TAKE 1 TABLET BY MOUTH AT BEDTIME. What changed: how much to take   multivitamin with minerals Tabs tablet Take 1 tablet by mouth daily.   mycophenolate 180 MG EC tablet Commonly known as: MYFORTIC Take 360 mg by mouth 2 (two) times daily.   omeprazole 20 MG capsule Commonly known as: PRILOSEC TAKE 1 CAPSULE BY MOUTH DAILY. What changed:  how much to take when to take this   Potassium Chloride CR 8 MEQ Cpcr capsule CR Commonly known as: MICRO-K Take 10 capsules (80 mEq total) by mouth 2 (two) times daily.   predniSONE 5 MG tablet Commonly known as: DELTASONE Take 1 tablet (5 mg total) by mouth daily.   rosuvastatin 10 MG tablet Commonly known as: CRESTOR Take 1 tablet (10 mg total) by mouth daily. What changed: when to take this   Semglee (yfgn) 100 UNIT/ML Pen Generic drug: insulin glargine-yfgn Inject 40 Units subcutaneously once daily What changed:  how much to take when to take this   sulfamethoxazole-trimethoprim 400-80 MG tablet Commonly known as: BACTRIM Take 1 tablet by mouth 3 (three) times a week. Monday, Wednesday, Friday   tacrolimus 1 MG capsule Commonly known as: PROGRAF Take 2 capsules (2 mg total) by mouth 2 times daily.   tadalafil (PAH) 20 MG tablet Commonly known as: ADCIRCA TAKE 1 TABLET (20 MG TOTAL) BY MOUTH EVERY OTHER DAY. What changed:  how much to take how to take this when to take this   tamsulosin 0.4 MG Caps capsule Commonly known  as: FLOMAX TAKE 1 CAPSULE BY MOUTH DAILY. What changed: when to take this   torsemide 100 MG tablet Commonly known as: DEMADEX Take 1 tablet (100 mg total) by mouth 2 (two) times daily.   Unifine Pentips 31G X 5 MM Misc Generic drug: Insulin Pen Needle Use 4 times daily as directed   vitamin C 250 MG tablet Commonly known as: ASCORBIC ACID Take 1 tablet (250 mg total) by mouth daily.   zinc sulfate 220 (50 Zn)  MG capsule Take 220 mg by mouth daily.        Follow-up Information     [name] Follow up on 08/23/2021.   Specialty: Cardiology Why: Advanced Heart Failure Clinic 10:30 am Entrance C, Free Valet Parking Contact information: [address] [id number] [place]        [name] I, MD Follow up in 1 week(s).   Specialties: Internal Medicine, Pediatrics Contact information: [place] [address] [phone]         [name], MD .   Specialty: Cardiology Contact information: [place] [address] [phone]         [name], MD .   Specialty: Cardiology Contact information: [place] [address] [phone]                Allergies  Allergen Reactions   Contrast Media [Iodinated Contrast Media] Other (See Comments)    Kidney transplant   Ibuprofen Other (See Comments)    Due to kidney transplant Due to kidney transplant Due to kidney transplant    Consultations: Cardiology and nephrology   Procedures/Studies: DG Chest 2 View  Result Date: 08/15/2021 CLINICAL DATA:  Short of breath EXAM: CHEST - 2 VIEW COMPARISON:  07/12/2019 FINDINGS: Prior CABG. Cardiac enlargement. Mild vascular congestion. Negative for pulmonary edema or pleural effusion. Negative for pneumonia. Calcified right paratracheal lymph node unchanged. IMPRESSION: Cardiac  enlargement with mild vascular congestion. Negative for edema or effusion. Electronically Signed   By: Franchot Gallo M.D.   On: 08/15/2021 11:42   US Renal Transplant w/Doppler  Result Date: 08/15/2021 CLINICAL DATA:  [id number]. Status post renal transplantation. Acute renal failure. EXAM: ULTRASOUND OF RENAL TRANSPLANT WITH RENAL DOPPLER ULTRASOUND TECHNIQUE: Ultrasound examination of the renal transplant was performed with gray-scale, color and duplex doppler evaluation. COMPARISON:  07/16/2019 FINDINGS: Transplant kidney location: Right lower quadrant Transplant Kidney: Renal measurements: 11.2 x 5.0 x 5.4 cm = volume: 157m. Renal cortical echogenicity is normal and cortical thickness is preserved. There has developed mild hydronephrosis. No intrarenal masses or calcifications are seen. No perinephric fluid collections are identified. Color flow in the main renal artery:  Yes Color flow in the main renal vein:  Yes Duplex Doppler Evaluation: Main Renal Artery Velocity: 55 cm/sec Main Renal Artery Resistive Index: 0.86 Venous waveform in main renal vein:  Present Intrarenal resistive index in upper pole:  0.75 (normal 0.6-0.8; equivocal 0.8-0.9; abnormal >= 0.9) Intrarenal resistive index in lower pole: 0.66 (normal 0.6-0.8; equivocal 0.8-0.9; abnormal >= 0.9) Bladder: Normal for degree of bladder distention. Other findings:  None. IMPRESSION: Interval development of mild hydronephrosis. Slightly elevated resistive indices within the main right renal artery this is equivocal given the normal resistive indices at the corticomedullary junctions bilaterally, but can be seen in the setting of ureteric obstruction (given the development of mild hydronephrosis) as well as transplant rejection or drug toxicity. Electronically Signed   By: AFidela SalisburyM.D.   On: 08/15/2021 21:15   ECHOCARDIOGRAM COMPLETE  Result Date: 08/16/2021    ECHOCARDIOGRAM REPORT   Patient Name:   [name] Date of Exam:  08/16/2021 Medical Rec #:  [id number]              Height:       66.0 in Accession #:    [id number]  Weight:       269.9 lb Date of Birth:  Jul 17, 1960               BSA:          2.271 m Patient Age:    61 years                BP:           141/71 mmHg Patient Gender: M                       HR:           60 bpm. Exam Location:  Inpatient Procedure: 2D Echo, Cardiac Doppler and Color Doppler Indications:    CHF  History:        Patient has prior history of Echocardiogram examinations, most                 recent 05/03/2019. CHF; Risk Factors:Hypertension and Diabetes.  Sonographer:    Jefferey Pica Referring Phys: [place]  1. Left ventricular ejection fraction, by estimation, is 40 to 45%. Left ventricular ejection fraction by PLAX is 40 %. The left ventricle has mildly decreased function. The left ventricle demonstrates global hypokinesis. The left ventricular internal cavity size was moderately dilated. There is moderate left ventricular hypertrophy. Left ventricular diastolic parameters are consistent with Grade III diastolic dysfunction (restrictive). Elevated left ventricular end-diastolic pressure.  2. Right ventricular systolic function is mildly reduced. The right ventricular size is normal. Tricuspid regurgitation signal is inadequate for assessing PA pressure.  3. Left atrial size was severely dilated.  4. Right atrial size was moderately dilated.  5. A small pericardial effusion is present. The pericardial effusion is circumferential. There is no evidence of cardiac tamponade.  6. The mitral valve is abnormal. Mild mitral valve regurgitation. Moderate to severe mitral annular calcification.  7. The aortic valve is tricuspid. Aortic valve regurgitation is not visualized.  8. The inferior vena cava is dilated in size with <50% respiratory variability, suggesting right atrial pressure of 15 mmHg. Comparison(s): Changes from prior study are noted. 05/03/2019: LVEF 45-50%.  FINDINGS  Left Ventricle: Left ventricular ejection fraction, by estimation, is 40 to 45%. Left ventricular ejection fraction by PLAX is 40 %. The left ventricle has mildly decreased function. The left ventricle demonstrates global hypokinesis. The left ventricular internal cavity size was moderately dilated. There is moderate left ventricular hypertrophy. Left ventricular diastolic parameters are consistent with Grade III diastolic dysfunction (restrictive). Elevated left ventricular end-diastolic pressure. Right Ventricle: The right ventricular size is normal. No increase in right ventricular wall thickness. Right ventricular systolic function is mildly reduced. Tricuspid regurgitation signal is inadequate for assessing PA pressure. Left Atrium: Left atrial size was severely dilated. Right Atrium: Right atrial size was moderately dilated. Pericardium: A small pericardial effusion is present. The pericardial effusion is circumferential. There is no evidence of cardiac tamponade. Mitral Valve: The mitral valve is abnormal. Moderate to severe mitral annular calcification. Mild mitral valve regurgitation. Tricuspid Valve: The tricuspid valve is grossly normal. Tricuspid valve regurgitation is trivial. Aortic Valve: The aortic valve is tricuspid. Aortic valve regurgitation is not visualized. Aortic valve peak gradient measures 12.9 mmHg. Pulmonic Valve: The pulmonic valve was not well visualized. Pulmonic valve regurgitation is not visualized. Aorta: The aortic root and ascending aorta are structurally normal, with no evidence of dilitation. Venous: The inferior vena cava is dilated in size with less than 50% respiratory variability,  suggesting right atrial pressure of 15 mmHg. IAS/Shunts: No atrial level shunt detected by color flow Doppler.  LEFT VENTRICLE PLAX 2D LV EF:         Left            Diastology                ventricular     LV e' lateral:   7.42 cm/s                ejection        LV E/e' lateral: 23.3                 fraction by                PLAX is 40                %. LVIDd:         6.00 cm LVIDs:         4.80 cm LV PW:         1.40 cm LV IVS:        1.40 cm LVOT diam:     2.20 cm LV SV:         130 LV SV Index:   57 LVOT Area:     3.80 cm  RIGHT VENTRICLE            IVC RV S prime:     7.76 cm/s  IVC diam: 3.30 cm LEFT ATRIUM              Index        RIGHT ATRIUM           Index LA diam:        5.80 cm  2.55 cm/m   RA Area:     27.50 cm LA Vol (A2C):   87.4 ml  38.48 ml/m  RA Volume:   95.60 ml  42.09 ml/m LA Vol (A4C):   129.0 ml 56.79 ml/m LA Biplane Vol: 113.0 ml 49.75 ml/m  AORTIC VALVE                  PULMONIC VALVE AV Area (Vmax): 3.24 cm      PV Vmax:       1.00 m/s AV Vmax:        179.50 cm/s   PV Peak grad:  4.0 mmHg AV Peak Grad:   12.9 mmHg LVOT Vmax:      153.00 cm/s LVOT Vmean:     107.000 cm/s LVOT VTI:       0.342 m  AORTA Ao Root diam: 3.50 cm Ao Asc diam:  3.60 cm MITRAL VALVE MV Area (PHT): 5.23 cm     SHUNTS MV Decel Time: 145 msec     Systemic VTI:  0.34 m MV E velocity: 173.00 cm/s  Systemic Diam: 2.20 cm Lyman Bishop MD Electronically signed by Lyman Bishop MD Signature Date/Time: 08/16/2021/11:10:55 AM    Final      Discharge Exam: Vitals:   08/17/21 0756 08/17/21 0800  BP: 115/63   Pulse: 76   Resp: 18   Temp: 97.8 F (36.6 C)   SpO2: 96% 97%   Vitals:   08/17/21 0346 08/17/21 0500 08/17/21 0756 08/17/21 0800  BP: 136/75  115/63   Pulse: 70  76   Resp: 17  18   Temp: 97.7 F (36.5 C)  97.8 F (36.6 C)   TempSrc: Oral  Oral  SpO2: 92%  96% 97%  Weight:  120.9 kg    Height:        General: Pt is alert, awake, not in acute distress, morbidly obese Cardiovascular: RRR, S1/S2 +, no rubs, no gallops Respiratory: CTA bilaterally, no wheezing, no rhonchi Abdominal: Soft, NT, ND, bowel sounds + Extremities: no edema, no cyanosis    The results of significant diagnostics from this hospitalization (including imaging, microbiology, ancillary and  laboratory) are listed below for reference.     Microbiology: No results found for this or any previous visit (from the past 240 hour(s)).   Labs: BNP (last 3 results) Recent Labs    10/30/20 1217 08/01/21 0922 08/15/21 1159  BNP 171.2* 228.3* 650.3*   Basic Metabolic Panel: Recent Labs  Lab 08/15/21 1159 08/15/21 1412 08/15/21 1843 08/16/21 0249 08/17/21 0136  NA 142 143 141 140 140  K 6.0* 7.2* 6.1* 5.0 4.1  CL 107 109 107 107 103  CO2 29  --  _0 GLUCOSE 71 131* 164* 132* 153*  BUN 51* 51* 51* 49* 57*  CREATININE 2.23* 2.40* 2.35* 2.22* 2.24*  CALCIUM 9.2  --  9.5 9.5 9.3   Liver Function Tests: Recent Labs  Lab 08/15/21 1159  AST 30  ALT 18  ALKPHOS 133*  BILITOT 0.9  PROT 7.4  ALBUMIN 3.8   No results for input(s): LIPASE, AMYLASE in the last 168 hours. No results for input(s): AMMONIA in the last 168 hours. CBC: Recent Labs  Lab 08/15/21 1159 08/15/21 1412 08/16/21 0249  WBC 4.5  --  5.5  NEUTROABS 2.8  --   --   HGB 13.3 13.6 12.5*  HCT 43.0 40.0 39.4  MCV 87.4  --  86.8  PLT 121*  --  93*   Cardiac Enzymes: No results for input(s): CKTOTAL, CKMB, CKMBINDEX, TROPONINI in the last 168 hours. BNP: Invalid input(s): POCBNP CBG: Recent Labs  Lab 08/16/21 1112 08/16/21 1603 08/16/21 2136 08/17/21 0602 08/17/21 1106  GLUCAP 170* 166* 267* 84 196*   D-Dimer No results for input(s): DDIMER in the last 72 hours. Hgb A1c Recent Labs    08/15/21 1843  HGBA1C 7.6*   Lipid Profile No results for input(s): CHOL, HDL, LDLCALC, TRIG, CHOLHDL, LDLDIRECT in the last 72 hours. Thyroid function studies No results for input(s): TSH, T4TOTAL, T3FREE, THYROIDAB in the last 72 hours.  Invalid input(s): FREET3 Anemia work up No results for input(s): VITAMINB12, FOLATE, FERRITIN, TIBC, IRON, RETICCTPCT in the last 72 hours. Urinalysis    Component Value Date/Time   COLORURINE STRAW (A) 08/15/2021 1913   APPEARANCEUR CLEAR 08/15/2021 1913    APPEARANCEUR Clear 09/23/2014 0855   LABSPEC 1.008 08/15/2021 1913   PHURINE 5.0 08/15/2021 1913   GLUCOSEU 150 (A) 08/15/2021 1913   HGBUR NEGATIVE 08/15/2021 1913   BILIRUBINUR NEGATIVE 08/15/2021 1913   BILIRUBINUR Negative 09/23/2014 [place] 08/15/2021 1913   PROTEINUR NEGATIVE 08/15/2021 1913   NITRITE NEGATIVE 08/15/2021 1913   LEUKOCYTESUR NEGATIVE 08/15/2021 1913   Sepsis Labs Invalid input(s): PROCALCITONIN,  WBC,  LACTICIDVEN Microbiology No results found for this or any previous visit (from the past 240 hour(s)).   Time coordinating discharge: Over 30 minutes  SIGNED:   Darliss Cheney, MD  [facility] 08/17/2021, 12:54 PM *Please note that this is a verbal dictation therefore any spelling or grammatical errors are due to the "[employer]" system interpretation. If 7PM-7AM, please contact night-coverage [email or web address]

## 2021-08-17 NOTE — Progress Notes (Addendum)
Advanced Heart Failure Rounding Note  PCP-Cardiologist: [name], MD   Subjective:     Weight down another 3 lb overnight. 3.4L UOP charted yesterday. Nephrology converting IV lasix to po Torsemide 100 mg BID.  Scr stable, 2.4>2.3>2.2.  BP okay.  Feeling well. Dyspnea improved. No recurrent orthopnea.   Hoping to go home soon. His sister is in town, has not visited with her in several years.   Objective:   Weight Range: 120.9 kg Body mass index is 43.02 kg/m.   Vital Signs:   Temp:  [97.6 F (36.4 C)-98.1 F (36.7 C)] 97.8 F (36.6 C) (05/26 0756) Pulse Rate:  [60-76] 76 (05/26 0756) Resp:  [17-18] 18 (05/26 0756) BP: (115-140)/(63-91) 115/63 (05/26 0756) SpO2:  [92 %-97 %] 97 % (05/26 0800) Weight:  [120.9 kg] 120.9 kg (05/26 0500) Last BM Date : 08/16/21  Weight change: Filed Weights   08/15/21 1122 08/16/21 0500 08/17/21 0500  Weight: 124.3 kg 122.4 kg 120.9 kg    Intake/Output:   Intake/Output Summary (Last 24 hours) at 08/17/2021 0932 Last data filed at 08/17/2021 0849 Gross per 24 hour  Intake 1543 ml  Output 3410 ml  Net -1867 ml      Physical Exam    General:  No distress. Sitting up in chair. HEENT: Normal Neck: Supple. JVP difficult to assess d/t neck size. Carotids 2+ bilat; no bruits. Cor: PMI nondisplaced. Irregular rate & rhythm. No rubs, gallops or murmurs. Lungs: Clear Abdomen: obese, soft, nontender, nondistended. Extremities: No cyanosis, clubbing, rash, 1+ edema, chronic venous stasis changes b/l LE Neuro: Alert & orientedx3, cranial nerves grossly intact. moves all 4 extremities w/o difficulty. Affect pleasant   Telemetry   Afib 60s-70s, 2-3 PVCs/min   Labs    CBC Recent Labs    08/15/21 1159 08/15/21 1412 08/16/21 0249  WBC 4.5  --  5.5  NEUTROABS 2.8  --   --   HGB 13.3 13.6 12.5*  HCT 43.0 40.0 39.4  MCV 87.4  --  86.8  PLT 121*  --  93*   Basic Metabolic Panel Recent Labs    08/16/21 0249  08/17/21 0136  NA 140 140  K 5.0 4.1  CL 107 103  CO2 27 29  GLUCOSE 132* 153*  BUN 49* 57*  CREATININE 2.22* 2.24*  CALCIUM 9.5 9.3   Liver Function Tests Recent Labs    08/15/21 1159  AST 30  ALT 18  ALKPHOS 133*  BILITOT 0.9  PROT 7.4  ALBUMIN 3.8   No results for input(s): LIPASE, AMYLASE in the last 72 hours. Cardiac Enzymes No results for input(s): CKTOTAL, CKMB, CKMBINDEX, TROPONINI in the last 72 hours.  BNP: BNP (last 3 results) Recent Labs    10/30/20 1217 08/01/21 0922 08/15/21 1159  BNP 171.2* 228.3* 259.3*    ProBNP (last 3 results) No results for input(s): PROBNP in the last 8760 hours.   D-Dimer No results for input(s): DDIMER in the last 72 hours. Hemoglobin A1C Recent Labs    08/15/21 1843  HGBA1C 7.6*   Fasting Lipid Panel No results for input(s): CHOL, HDL, LDLCALC, TRIG, CHOLHDL, LDLDIRECT in the last 72 hours. Thyroid Function Tests No results for input(s): TSH, T4TOTAL, T3FREE, THYROIDAB in the last 72 hours.  Invalid input(s): FREET3  Other results:   Imaging    No results found.   Medications:     Scheduled Medications:  apixaban  5 mg Oral BID   budesonide (PULMICORT) nebulizer solution  0.25 mg Nebulization BID   buPROPion  150 mg Oral BID   colchicine  0.6 mg Oral QODAY   fluticasone  2 spray Each Nare Daily   gabapentin  300 mg Oral BID   hydrALAZINE  12.5 mg Oral Q8H   insulin aspart  0-15 Units Subcutaneous TID WC   insulin aspart  0-5 Units Subcutaneous QHS   insulin glargine-yfgn  40 Units Subcutaneous QHS   montelukast  10 mg Oral QHS   mycophenolate  360 mg Oral BID   pantoprazole  40 mg Oral Daily   predniSONE  5 mg Oral Daily   rosuvastatin  10 mg Oral Daily   sodium chloride flush  3 mL Intravenous Q12H   sulfamethoxazole-trimethoprim  1 tablet Oral Once per day on Mon Wed Fri   tacrolimus  2 mg Oral BID   tadalafil  20 mg Oral QODAY   tamsulosin  0.4 mg Oral Daily   torsemide  100 mg Oral BID     Infusions:   PRN Medications: acetaminophen **OR** acetaminophen, albuterol, calcium carbonate (dosed in mg elemental calcium), camphor-menthol **AND** hydrOXYzine, docusate sodium, feeding supplement (NEPRO CARB STEADY), hydrALAZINE, ondansetron **OR** ondansetron (ZOFRAN) IV, sorbitol, zolpidem    Patient Profile   61 y/o male w/ chronic systolic heart failure w/ biventricular dysfunction EF 40-45%, RV mild-mod reduced, PH, OSA, obesity, Stage III CKD s/p renal transplant in 2012, chronic atrial fibrillation , HTN and type 2DM admitted for a/c systolic heart failure and AKI on CKD.   Assessment/Plan   1. Acute on Chronic Heart failure with Mid Range EF/ With prominent RV failure/pulmonary hypertension:  Ischemic cardiomyopathy.  ?If RV failure is related to severe OSA, he was a remote smoker.  Echo in 2/21 with EF 45-50%, moderately dilated RV with moderately decreased systolic function, moderate pulmonary hypertension. Admitted w/ volume overload and progression to NYHA class III symptoms. Scheduled weekly Metolazone recently discontinued in setting worsening renal function. Echo this admit EF 40-45%, RV mildly reduced, GIIIDD (restrictive), IVC dilated.  - Diuresing well w/ IV Lasix. Nephrology dosing diuretics>>converting to po Torsemide 100 mg BID (home dose). May have a little more diuresis to go. - Home dapagliflozin, Spiro and losartan on hold w/ recent bump in SCr ( up to 2.4 > 2.2, prior BL ~1.7) - Continue hydralazine 12.5 mg tid for afterload reduction  - no Imdur given tadalafil use for PH  - Will need RHC prior to discharge, will discuss timing with [name]. - Strict I/Os ordered.  - Unable to confirm that his insurance will cover Cardiomems so will manage him without this.   2. Atrial fibrillation: Chronic. Rate controlled.  - Continue Eliquis. No bleeding issues. - Not on beta blocker with history of bradycardia.   3. CAD: S/p CABG 2005.  Cardiolite 12/22 with prior  MI, no ischemia. - No chest pain.  - No ASA given Eliquis use.  - Continue Crestor 10 mg daily, good lipids 2/23. 4. AKI on CKD Stage 3: S/p renal transplantation in 2012. Followed by transplant center at [facility]. He remains on mycophenolate and tacrolimus.  Recent bump in BL SCr from 1.7>>2.2 range. Admission SCr 2.4>>2.2 today - Nephrology following - follow BMP w/ diuresis  - keep off SLGT2i, spiro and losartan for now - avoid hypotension  - plan RHC as above 5. OSA: Continue Bipap every night.  6. HTN: BP stable. -Meds as above. 7. DM 2: A1c 7.6. - Hold dapagliflozin w/ AKI. - insulin per  primary team  8. Pulmonary hypertension: Severe pulmonary hypertension by 3/20 echo.  RHC in 2/21 with moderate, primarily pulmonary venous hypertension (PVR 3.25 WU). Echo this admit w/ mildly reduced RV. RVSP not estimated. Suspect that there is also a component of group 3 PH (OSA, ? unrecognized OHS).  - Continue tadalafil - Diuresis per above - Plan repeat RHC this admit 9. Hyperkalemia: K 7.2 on admit, in setting of AKI on CKD. Also taking KCl supp at home. Corrected w/ Lokelma, insulin, albuterol and IV Lasix. K 4.1 today. - keep off Arlyce Harman and losartan - follow BMP closely  Length of Stay: 2  [name], [place], PA-C  08/17/2021, 9:32 AM  Advanced Heart Failure Team Pager [phone] (M-F; 7a - 5p)  Please contact [facility] for night-coverage after hours (5p -7a ) and weekends on [email or web address]   Patient seen with PA, agree with the above note.   Weight down another 3 lbs, good diuresis.  BUN/creatinine minimally higher.  No complaints this morning, feels like breathing is back to normal.   General: NAD Neck: JVP 8 cm, no thyromegaly or thyroid nodule.  Lungs: Clear to auscultation bilaterally with normal respiratory effort. CV: Nondisplaced PMI.  Heart regular S1/S2, no S3/S4, no murmur.  No peripheral edema.   Abdomen: Soft, nontender, no hepatosplenomegaly, no distention.  Skin:  Intact without lesions or rashes.  Neurologic: Alert and oriented x 3.  Psych: Normal affect. Extremities: No clubbing or cyanosis.  HEENT: Normal.   Weight down now by about 8 lbs, symptomatically back to normal and looks pretty good on exam.  BUN/creatinine marginally higher.  Discussed with Dr. Posey Pronto, think safest to switch back to po regimen today.  Will keep off losartan, spironolactone, and Farxiga for now.  Will try to start back on Farxiga at followup in about a week. Hold off on RHC for now as I do not think it would change the plan.   Would send home on torsemide 100 mg bid and metolazone 2.5 mg twice a week, as he was doing pre-admission.   Close followup in CHF clinic (10 days or so) and with nephrology (renal transplant).   Loralie Champagne 08/17/2021 10:37 AM

## 2021-08-17 NOTE — Progress Notes (Signed)
Patient ID: Carl Jelley., male   DOB: Jan 19, 1961, 61 y.o.   MRN: [id number] [facility] Progress Note   Assessment/ Plan:   1. Acute kidney Injury on chronic kidney disease stage IIIbT: Status post deceased donor kidney transplant in 2012 at [facility].  Renal biopsy in 2017 showing diabetic nephropathy of allograft.  Baseline creatinine around 1.7 with immunosuppression on Myfortic/Prograf.  With acute kidney injury that this is suspected to be from acute exacerbation of congestive heart failure and essentially stable renal function overnight with ongoing diuresis-based on his current volume status, will convert him back to oral torsemide. 2.  Pulmonary hypertension/combined systolic/diastolic heart failure (EF 35%): Switched to intravenous furosemide from oral torsemide when admitted with decent urine output/volume unloading; today will transition back to oral torsemide anticipating ability to discharge him within the next 24-48 hours.  Corresponding weight loss of around 4 kg seen over the last 2 days.  On tadalafil. 3.  Atrial fibrillation: Rate controlled and on chronic anticoagulation with Eliquis. 4.  Type 2 diabetes mellitus: On insulin with ongoing management per primary service 5.  Hypertension: Blood pressures acceptable with ongoing efforts at volume unloading/diuresis.  Subjective:   Reports to be feeling better, inquires about going home.  Able to ambulate around hallways without shortness of breath   Objective:   BP 136/75 (BP Location: Right Arm)   Pulse 70   Temp 97.7 F (36.5 C) (Oral)   Resp 17   Ht _0  (1.676 m)   Wt 120.9 kg   SpO2 92%   BMI 43.02 kg/m   Intake/Output Summary (Last 24 hours) at 08/17/2021 0715 Last data filed at 08/17/2021 0600 Gross per 24 hour  Intake 1443 ml  Output 3400 ml  Net -1957 ml   Weight change: -3.386 kg  Physical Exam: Gen: Sitting up comfortably in recliner, eating breakfast CVS:  Pulse regular rhythm, normal rate, S1 and S2 normal Resp: Clear to auscultation bilaterally, no distinct rales or rhonchi Abd: Soft, obese, nontender, bowel sounds normal Ext: Trace lower extremity edema with hyperpigmented skin changes pretibially  Imaging: DG Chest 2 View  Result Date: 08/15/2021 CLINICAL DATA:  Short of breath EXAM: CHEST - 2 VIEW COMPARISON:  07/12/2019 FINDINGS: Prior CABG. Cardiac enlargement. Mild vascular congestion. Negative for pulmonary edema or pleural effusion. Negative for pneumonia. Calcified right paratracheal lymph node unchanged. IMPRESSION: Cardiac enlargement with mild vascular congestion. Negative for edema or effusion. Electronically Signed   By: Franchot Gallo M.D.   On: 08/15/2021 11:42   US Renal Transplant w/Doppler  Result Date: 08/15/2021 CLINICAL DATA:  [id number]. Status post renal transplantation. Acute renal failure. EXAM: ULTRASOUND OF RENAL TRANSPLANT WITH RENAL DOPPLER ULTRASOUND TECHNIQUE: Ultrasound examination of the renal transplant was performed with gray-scale, color and duplex doppler evaluation. COMPARISON:  07/16/2019 FINDINGS: Transplant kidney location: Right lower quadrant Transplant Kidney: Renal measurements: 11.2 x 5.0 x 5.4 cm = volume: 146m. Renal cortical echogenicity is normal and cortical thickness is preserved. There has developed mild hydronephrosis. No intrarenal masses or calcifications are seen. No perinephric fluid collections are identified. Color flow in the main renal artery:  Yes Color flow in the main renal vein:  Yes Duplex Doppler Evaluation: Main Renal Artery Velocity: 55 cm/sec Main Renal Artery Resistive Index: 0.86 Venous waveform in main renal vein:  Present Intrarenal resistive index in upper pole:  0.75 (normal 0.6-0.8; equivocal 0.8-0.9; abnormal >= 0.9) Intrarenal resistive index in lower pole: 0.66 (normal 0.6-0.8; equivocal 0.8-0.9; abnormal >=  0.9) Bladder: Normal for degree of bladder distention. Other  findings:  None. IMPRESSION: Interval development of mild hydronephrosis. Slightly elevated resistive indices within the main right renal artery this is equivocal given the normal resistive indices at the corticomedullary junctions bilaterally, but can be seen in the setting of ureteric obstruction (given the development of mild hydronephrosis) as well as transplant rejection or drug toxicity. Electronically Signed   By: Fidela Salisbury M.D.   On: 08/15/2021 21:15   ECHOCARDIOGRAM COMPLETE  Result Date: 08/16/2021    ECHOCARDIOGRAM REPORT   Patient Name:   Carl Schembri. Date of Exam: 08/16/2021 Medical Rec #:  [id number]               Height:       66.0 in Accession #:    [id number]              Weight:       269.9 lb Date of Birth:  02-19-1961               BSA:          2.271 m Patient Age:    39 years                BP:           141/71 mmHg Patient Gender: M                       HR:           60 bpm. Exam Location:  Inpatient Procedure: 2D Echo, Cardiac Doppler and Color Doppler Indications:    CHF  History:        Patient has prior history of Echocardiogram examinations, most                 recent 05/03/2019. CHF; Risk Factors:Hypertension and Diabetes.  Sonographer:    Jefferey Pica Referring Phys: Elaine  1. Left ventricular ejection fraction, by estimation, is 40 to 45%. Left ventricular ejection fraction by PLAX is 40 %. The left ventricle has mildly decreased function. The left ventricle demonstrates global hypokinesis. The left ventricular internal cavity size was moderately dilated. There is moderate left ventricular hypertrophy. Left ventricular diastolic parameters are consistent with Grade III diastolic dysfunction (restrictive). Elevated left ventricular end-diastolic pressure.  2. Right ventricular systolic function is mildly reduced. The right ventricular size is normal. Tricuspid regurgitation signal is inadequate for assessing PA pressure.  3. Left atrial  size was severely dilated.  4. Right atrial size was moderately dilated.  5. A small pericardial effusion is present. The pericardial effusion is circumferential. There is no evidence of cardiac tamponade.  6. The mitral valve is abnormal. Mild mitral valve regurgitation. Moderate to severe mitral annular calcification.  7. The aortic valve is tricuspid. Aortic valve regurgitation is not visualized.  8. The inferior vena cava is dilated in size with <50% respiratory variability, suggesting right atrial pressure of 15 mmHg. Comparison(s): Changes from prior study are noted. 05/03/2019: LVEF 45-50%. FINDINGS  Left Ventricle: Left ventricular ejection fraction, by estimation, is 40 to 45%. Left ventricular ejection fraction by PLAX is 40 %. The left ventricle has mildly decreased function. The left ventricle demonstrates global hypokinesis. The left ventricular internal cavity size was moderately dilated. There is moderate left ventricular hypertrophy. Left ventricular diastolic parameters are consistent with Grade III diastolic dysfunction (restrictive). Elevated left ventricular end-diastolic pressure. Right Ventricle: The right ventricular size is  normal. No increase in right ventricular wall thickness. Right ventricular systolic function is mildly reduced. Tricuspid regurgitation signal is inadequate for assessing PA pressure. Left Atrium: Left atrial size was severely dilated. Right Atrium: Right atrial size was moderately dilated. Pericardium: A small pericardial effusion is present. The pericardial effusion is circumferential. There is no evidence of cardiac tamponade. Mitral Valve: The mitral valve is abnormal. Moderate to severe mitral annular calcification. Mild mitral valve regurgitation. Tricuspid Valve: The tricuspid valve is grossly normal. Tricuspid valve regurgitation is trivial. Aortic Valve: The aortic valve is tricuspid. Aortic valve regurgitation is not visualized. Aortic valve peak gradient measures  12.9 mmHg. Pulmonic Valve: The pulmonic valve was not well visualized. Pulmonic valve regurgitation is not visualized. Aorta: The aortic root and ascending aorta are structurally normal, with no evidence of dilitation. Venous: The inferior vena cava is dilated in size with less than 50% respiratory variability, suggesting right atrial pressure of 15 mmHg. IAS/Shunts: No atrial level shunt detected by color flow Doppler.  LEFT VENTRICLE PLAX 2D LV EF:         Left            Diastology                ventricular     LV e' lateral:   7.42 cm/s                ejection        LV E/e' lateral: 23.3                fraction by                PLAX is 40                %. LVIDd:         6.00 cm LVIDs:         4.80 cm LV PW:         1.40 cm LV IVS:        1.40 cm LVOT diam:     2.20 cm LV SV:         130 LV SV Index:   57 LVOT Area:     3.80 cm  RIGHT VENTRICLE            IVC RV S prime:     7.76 cm/s  IVC diam: 3.30 cm LEFT ATRIUM              Index        RIGHT ATRIUM           Index LA diam:        5.80 cm  2.55 cm/m   RA Area:     27.50 cm LA Vol (A2C):   87.4 ml  38.48 ml/m  RA Volume:   95.60 ml  42.09 ml/m LA Vol (A4C):   129.0 ml 56.79 ml/m LA Biplane Vol: 113.0 ml 49.75 ml/m  AORTIC VALVE                  PULMONIC VALVE AV Area (Vmax): 3.24 cm      PV Vmax:       1.00 m/s AV Vmax:        179.50 cm/s   PV Peak grad:  4.0 mmHg AV Peak Grad:   12.9 mmHg LVOT Vmax:      153.00 cm/s LVOT Vmean:     107.000 cm/s LVOT VTI:  0.342 m  AORTA Ao Root diam: 3.50 cm Ao Asc diam:  3.60 cm MITRAL VALVE MV Area (PHT): 5.23 cm     SHUNTS MV Decel Time: 145 msec     Systemic VTI:  0.34 m MV E velocity: 173.00 cm/s  Systemic Diam: 2.20 cm Lyman Bishop MD Electronically signed by Lyman Bishop MD Signature Date/Time: 08/16/2021/11:10:55 AM    Final     Labs: BMET Recent Labs  Lab 08/15/21 1159 08/15/21 1412 08/15/21 1843 08/16/21 0249 08/17/21 0136  NA 142 143 141 140 140  K 6.0* 7.2* 6.1* 5.0 4.1  CL 107 109  107 107 103  CO2 29  --  _0 GLUCOSE 71 131* 164* 132* 153*  BUN 51* 51* 51* 49* 57*  CREATININE 2.23* 2.40* 2.35* 2.22* 2.24*  CALCIUM 9.2  --  9.5 9.5 9.3   CBC Recent Labs  Lab 08/15/21 1159 08/15/21 1412 08/16/21 0249  WBC 4.5  --  5.5  NEUTROABS 2.8  --   --   HGB 13.3 13.6 12.5*  HCT 43.0 40.0 39.4  MCV 87.4  --  86.8  PLT 121*  --  93*    Medications:     apixaban  5 mg Oral BID   budesonide (PULMICORT) nebulizer solution  0.25 mg Nebulization BID   buPROPion  150 mg Oral BID   colchicine  0.6 mg Oral QODAY   fluticasone  2 spray Each Nare Daily   furosemide  80 mg Intravenous Q8H   gabapentin  300 mg Oral BID   hydrALAZINE  12.5 mg Oral Q8H   insulin aspart  0-15 Units Subcutaneous TID WC   insulin aspart  0-5 Units Subcutaneous QHS   insulin glargine-yfgn  40 Units Subcutaneous QHS   montelukast  10 mg Oral QHS   mycophenolate  360 mg Oral BID   pantoprazole  40 mg Oral Daily   predniSONE  5 mg Oral Daily   rosuvastatin  10 mg Oral Daily   sodium chloride flush  3 mL Intravenous Q12H   sulfamethoxazole-trimethoprim  1 tablet Oral Once per day on Mon Wed Fri   tacrolimus  2 mg Oral BID   tadalafil  20 mg Oral QODAY   tamsulosin  0.4 mg Oral Daily   Elmarie Shiley, MD 08/17/2021, 7:15 AM

## 2021-08-21 ENCOUNTER — Other Ambulatory Visit: Payer: Self-pay

## 2021-08-21 ENCOUNTER — Other Ambulatory Visit: Payer: Self-pay | Admitting: Cardiovascular Disease

## 2021-08-21 ENCOUNTER — Telehealth: Payer: Self-pay

## 2021-08-21 MED FILL — Apixaban Tab 5 MG: ORAL | 90 days supply | Qty: 180 | Fill #0 | Status: AC

## 2021-08-21 NOTE — Telephone Encounter (Signed)
He is scheduled with the CHF Clinic 08-23-21. Dr Silvio Pate had said he did not need to come here if he was seeing cardiology. Will forward to Dr Silvio Pate to see if he needs to see Catalina Antigua since the cardiology appt is really with the [facility].

## 2021-08-21 NOTE — Telephone Encounter (Signed)
Prescription refill request for Eliquis received. Indication: Atrial Fib Last office visit: 08/01/21  Carl Heap FNP Scr: 2.24 on 08/17/21 Age: 61 Weight: 124.2kg  Based on above findings Eliquis 65m twice daily is the appropriate dose.  Refill approved.

## 2021-08-21 NOTE — Telephone Encounter (Signed)
Transition Care Management Follow-up Telephone Call Date of discharge and from where: [facility] 08-17-21 Dx: Acute on chronic systolic CHF  How have you been since you were released from the hospital? Doing ok  Any questions or concerns? No  Items Reviewed: Did the pt receive and understand the discharge instructions provided? Yes  Medications obtained and verified? Yes  Other? No  Any new allergies since your discharge? No  Dietary orders reviewed? Yes Do you have support at home? Yes   Home Care and Equipment/Supplies: Were home health services ordered? no If so, what is the name of the agency? na  Has the agency set up a time to come to the patient's home? not applicable Were any new equipment or medical supplies ordered?  No What is the name of the medical supply agency? na Were you able to get the supplies/equipment? not applicable Do you have any questions related to the use of the equipment or supplies? na Functional Questionnaire: (I = Independent and D = Dependent) ADLs: I  Bathing/Dressing- I  Meal Prep- I  Eating- I  Maintaining continence- I  Transferring/Ambulation- I  Managing Meds- I  Follow up appointments reviewed:  PCP Hospital f/u appt confirmed? Yes  Scheduled to see Karl Ito NP on 08-24-21 @ 840am. [facility] f/u appt confirmed? Yes  [facility]23-21 @ 1030am. Are transportation arrangements needed? No  If their condition worsens, is the pt aware to call PCP or go to the Emergency Dept.? Yes Was the patient provided with contact information for the PCP's office or ED? Yes Was to pt encouraged to call back with questions or concerns? Yes

## 2021-08-21 NOTE — Telephone Encounter (Signed)
Refill Request.  

## 2021-08-22 ENCOUNTER — Telehealth (HOSPITAL_COMMUNITY): Payer: Self-pay

## 2021-08-22 ENCOUNTER — Encounter (HOSPITAL_COMMUNITY): Payer: Self-pay

## 2021-08-22 ENCOUNTER — Other Ambulatory Visit (HOSPITAL_COMMUNITY): Payer: Self-pay

## 2021-08-22 ENCOUNTER — Ambulatory Visit: Payer: 59 | Admitting: Nurse Practitioner

## 2021-08-22 ENCOUNTER — Other Ambulatory Visit: Payer: Self-pay

## 2021-08-22 NOTE — Telephone Encounter (Signed)
Patient called back, scheduled for first available which was 6/27.

## 2021-08-22 NOTE — Telephone Encounter (Signed)
Left detailed message on VM advising him that we can cancel the appt with Romilda Garret on 6-2 and reschedule a hospital follow-up (30 min visit) with Dr Silvio Pate the week of June 12 -June 16.

## 2021-08-22 NOTE — Progress Notes (Signed)
Had a home visit with Yaniv today.  He states feeling much better.  Found him outside working.  He is packing for his week beach trip with his family.  He states the shortness of breath has subsided.  He states he is upset that he can not do the things that he use to be able to.  He states he is not one to sit around.  Advised him to rest in between and just take things slower.  Went over his discharge paperwork and meds.  He states his wife picked up his medications and has lost them.  He has enough for a few days, he has contacted his pharmacy and they did a lost claim on a few but the torsemide.  Advised him to take the bottle with him to his appt tomorrow and let the HF clinic aware and see what they can do. He denies chest pain, headaches, dizziness.  He states was dizzy on Sunday and took his BP and was 87/40, but since his BP has been good and no dizziness.  He did start the hydralazine.  Lungs are clear, swelling in down in his legs.  His weight has come down 5 lbs.  He states watching his fluids and diet.  Heart rate is irregular, he has afib.  Abdomen is soft and non tender.  He lives with wife and has a lot of family support.  Will continue to visit for heart failure, diet and medication.  Made him aware to call me with any problems.    [place] [phone]

## 2021-08-22 NOTE — Telephone Encounter (Signed)
Called to confirm/remind patient of their appointment at the [facility] on 08/23/21.   Patient reminded to bring all medications and/or complete list.  Confirmed patient has transportation. Gave directions, instructed to utilize [facility] parking.  Confirmed appointment prior to ending call.

## 2021-08-23 ENCOUNTER — Ambulatory Visit (HOSPITAL_COMMUNITY)
Admission: RE | Admit: 2021-08-23 | Discharge: 2021-08-23 | Disposition: A | Discharge status: Home / Self Care | Payer: 59 | Source: Ambulatory Visit | Attending: Family Medicine | Admitting: Family Medicine

## 2021-08-23 ENCOUNTER — Encounter (HOSPITAL_COMMUNITY): Payer: Self-pay

## 2021-08-23 ENCOUNTER — Other Ambulatory Visit: Payer: Self-pay

## 2021-08-23 VITALS — BP 120/78 | HR 53 | Wt 273.0 lb

## 2021-08-23 DIAGNOSIS — M109 Gout, unspecified: Secondary | ICD-10-CM | POA: Diagnosis not present

## 2021-08-23 DIAGNOSIS — I2510 Atherosclerotic heart disease of native coronary artery without angina pectoris: Secondary | ICD-10-CM | POA: Insufficient documentation

## 2021-08-23 DIAGNOSIS — Z940 Kidney transplant status: Secondary | ICD-10-CM | POA: Diagnosis not present

## 2021-08-23 DIAGNOSIS — I272 Pulmonary hypertension, unspecified: Secondary | ICD-10-CM | POA: Insufficient documentation

## 2021-08-23 DIAGNOSIS — I255 Ischemic cardiomyopathy: Secondary | ICD-10-CM | POA: Diagnosis not present

## 2021-08-23 DIAGNOSIS — Z951 Presence of aortocoronary bypass graft: Secondary | ICD-10-CM | POA: Diagnosis not present

## 2021-08-23 DIAGNOSIS — I132 Hypertensive heart and chronic kidney disease with heart failure and with stage 5 chronic kidney disease, or end stage renal disease: Secondary | ICD-10-CM | POA: Insufficient documentation

## 2021-08-23 DIAGNOSIS — N183 Chronic kidney disease, stage 3 unspecified: Secondary | ICD-10-CM | POA: Diagnosis not present

## 2021-08-23 DIAGNOSIS — Z794 Long term (current) use of insulin: Secondary | ICD-10-CM | POA: Insufficient documentation

## 2021-08-23 DIAGNOSIS — Z87891 Personal history of nicotine dependence: Secondary | ICD-10-CM | POA: Diagnosis not present

## 2021-08-23 DIAGNOSIS — I10 Essential (primary) hypertension: Secondary | ICD-10-CM | POA: Diagnosis not present

## 2021-08-23 DIAGNOSIS — I5042 Chronic combined systolic (congestive) and diastolic (congestive) heart failure: Secondary | ICD-10-CM | POA: Insufficient documentation

## 2021-08-23 DIAGNOSIS — E1121 Type 2 diabetes mellitus with diabetic nephropathy: Secondary | ICD-10-CM | POA: Diagnosis not present

## 2021-08-23 DIAGNOSIS — I252 Old myocardial infarction: Secondary | ICD-10-CM | POA: Diagnosis not present

## 2021-08-23 DIAGNOSIS — G4733 Obstructive sleep apnea (adult) (pediatric): Secondary | ICD-10-CM | POA: Diagnosis not present

## 2021-08-23 DIAGNOSIS — Z7901 Long term (current) use of anticoagulants: Secondary | ICD-10-CM | POA: Diagnosis not present

## 2021-08-23 DIAGNOSIS — Z833 Family history of diabetes mellitus: Secondary | ICD-10-CM | POA: Diagnosis not present

## 2021-08-23 DIAGNOSIS — E1122 Type 2 diabetes mellitus with diabetic chronic kidney disease: Secondary | ICD-10-CM | POA: Insufficient documentation

## 2021-08-23 DIAGNOSIS — Z8249 Family history of ischemic heart disease and other diseases of the circulatory system: Secondary | ICD-10-CM | POA: Diagnosis not present

## 2021-08-23 DIAGNOSIS — Z8639 Personal history of other endocrine, nutritional and metabolic disease: Secondary | ICD-10-CM | POA: Diagnosis not present

## 2021-08-23 DIAGNOSIS — Z79899 Other long term (current) drug therapy: Secondary | ICD-10-CM | POA: Diagnosis not present

## 2021-08-23 DIAGNOSIS — I447 Left bundle-branch block, unspecified: Secondary | ICD-10-CM | POA: Insufficient documentation

## 2021-08-23 DIAGNOSIS — I482 Chronic atrial fibrillation, unspecified: Secondary | ICD-10-CM | POA: Insufficient documentation

## 2021-08-23 LAB — BASIC METABOLIC PANEL
Anion gap: 9 (ref 5–15)
BUN: 72 mg/dL — ABNORMAL HIGH (ref 8–23)
CO2: 28 mmol/L (ref 22–32)
Calcium: 9.3 mg/dL (ref 8.9–10.3)
Chloride: 106 mmol/L (ref 98–111)
Creatinine, Ser: 2.1 mg/dL — ABNORMAL HIGH (ref 0.61–1.24)
GFR, Estimated: 35 mL/min — ABNORMAL LOW (ref >60)
Glucose, Bld: 162 mg/dL — ABNORMAL HIGH (ref 70–99)
Potassium: 3.5 mmol/L (ref 3.5–5.1)
Sodium: 143 mmol/L (ref 135–145)

## 2021-08-23 LAB — CBC
HCT: 41.5 % (ref 39.0–52.0)
Hemoglobin: 12.8 g/dL — ABNORMAL LOW (ref 13.0–17.0)
MCH: 27 pg (ref 26.0–34.0)
MCHC: 30.8 g/dL (ref 30.0–36.0)
MCV: 87.6 fL (ref 80.0–100.0)
Platelets: 104 10*3/uL — ABNORMAL LOW (ref 150–400)
RBC: 4.74 MIL/uL (ref 4.22–5.81)
RDW: 15.6 % — ABNORMAL HIGH (ref 11.5–15.5)
WBC: 3.5 10*3/uL — ABNORMAL LOW (ref 4.0–10.5)
nRBC: 0 % (ref 0.0–0.2)

## 2021-08-23 LAB — BRAIN NATRIURETIC PEPTIDE: B Natriuretic Peptide: 288.1 pg/mL — ABNORMAL HIGH (ref 0.0–100.0)

## 2021-08-23 MED ORDER — TORSEMIDE 100 MG PO TABS
100.0000 mg | ORAL_TABLET | Freq: Two times a day (BID) | ORAL | 3 refills | Status: DC
  Filled 2021-08-23: qty 180, 90d supply, fill #0
  Filled 2021-11-19: qty 93, 47d supply, fill #0
  Filled 2021-11-20: qty 87, 43d supply, fill #0
  Filled 2022-01-01 – 2022-02-20 (×2): qty 180, 90d supply, fill #1

## 2021-08-23 MED ORDER — DAPAGLIFLOZIN PROPANEDIOL 10 MG PO TABS
10.0000 mg | ORAL_TABLET | Freq: Every day | ORAL | 11 refills | Status: DC
  Filled 2021-08-23: qty 30, 30d supply, fill #0

## 2021-08-23 NOTE — Patient Instructions (Signed)
RESTART Farxiga 10 mg one tab daily  Labs today We will only contact you if something comes back abnormal or we need to make some changes. Otherwise no news is good news!  Your physician recommends that you schedule a follow-up appointment in: 3-4 weeks  in the Advanced Practitioners (PA/NP) Clinic and keep follow up with Dr Aundra Dubin as scheduled  Do the following things EVERYDAY: Weigh yourself in the morning before breakfast. Write it down and keep it in a log. Take your medicines as prescribed Eat low salt foods--Limit salt (sodium) to 2000 mg per day.  Stay as active as you can everyday Limit all fluids for the day to less than 2 liters  At the [facility], you and your health needs are our priority. As part of our continuing mission to provide you with exceptional heart care, we have created designated Provider Care Teams. These Care Teams include your primary Cardiologist (physician) and Advanced Practice Providers (APPs- Physician Assistants and Nurse Practitioners) who all work together to provide you with the care you need, when you need it.   You may see any of the following providers on your designated Care Team at your next follow up: Dr Glori Bickers Dr Haynes Kerns, NP Lyda Jester, [place] [facility] [name], [place] Audry Riles, PharmD   Please be sure to bring in all your medications bottles to every appointment.

## 2021-08-23 NOTE — Progress Notes (Signed)
ReDS Vest / Clip - 08/23/21 1000       ReDS Vest / Clip   Station Marker D    Ruler Value 35.5    ReDS Value Range High volume overload    ReDS Actual Value 49

## 2021-08-23 NOTE — Progress Notes (Signed)
Advanced Heart Failure Clinic Note   Date:  08/23/2021   ID:  Carl Brew., DOB [birth year], MRN [id number]   Provider location: [address] [place] Type of Visit:  Established patient  PCP:  [name], MD  Cardiologist:  [name] Nephrology: Dr. Ronnald Ramp  HF Cardiologist: [name]  HPI: Tanav Caleel Kiner. is a 61 y.o. male  with a history of CAD s/p CABG 2005, chronic atrial fibrillation on Eliquis, ESRD s/p renal transplant 2012, anc chronic diastolic CHF.  His renal function has been fairly stable, most recent creatinine 1.43.  He has been struggling with volume overload/CHF.  He has a history of ischemic cardiomyopathy with EF as low as 35-40% in the past but echo in 3/20 showed EF 60-65%.  There is evidence for RV failure with pulmonary hypertension by echo.    Admitted 11/05/18 low grade fever and shortness of breath. HS Trop negative. Covid 19 negative. Blood CX negative. Placed on antibiotics and discharged to home the next day.   Admitted 05/02/34 with A/C Diastolic HF. Diuresed with IV lasix and transitioned to torsemide 80/40 mg . Discharge weight 252 pounds.   Admitted 06/2019 with left arm pain. Left upper extremity venous Doppler revealed occluded cephalic vein at the AV fistula outflow tract in the antecubital fossa with no other thrombus identified. Vascular evaluated. He continued on Eliquis.  No plan for intervention. Also treated for acute gout flare. Discharged on prednisone taper.     Given IV lasix on 12/21 by HF Paramedicine.   Ad[facility] to [facility] 2/22 with acute gout flare vs cellulitis. Nephrology was consulted to evaluate transplant regimen and follow along. He was treated with antibiotics and prednisone. Discharged on decreased torsemide dose of 40 mg daily. Weight 259 lbs.  Admitted 4/22 for left leg cellulitis after sustaining a dog scratch, received IV antibiotics. No changes to his diuretic therapy. Discharge weight 266  lbs.  Follow up with [name], 1/23, he had CP and underwent Lexiscan myoview, which overall was a low risk study with evidence of prior infarct in the infero-apical area with no significant change from before. Losartan added to GDMT regimen.  Follow up 5/23, volume overloaded, REDs 40%. Advised taking additional metolazone (3 doses that week), however labs showed worsening renal function and advised to keep at 2 doses.  Admitted with a/c CHF and AKI.  Gareth Eagle and losartan stopped. Diuresed with IV lasix. Echo showed EF 40-45%, RV mildly reduced, GIIIDD (restrictive), IVC dilated. Nephrology consulted for diuretic dosing. Discharged home, weight 266 lbs.  Today he returns for post hospital HF follow up. Overall feeling fine. Had some dizziness and low BP, no falls. Average BP at home 110/70s. No dyspnea working in the yard. Denies CP, palpitations, abnormal bleeding, edema, or PND/Orthopnea. Appetite ok. No fever or chills. Weight at home 264 pounds. Taking all medications. Wearing BiPap and now followed by [facility] .   ECG (personally reviewed): atrial fibrillation with PVC, LBBB QRS 150 msec  ReDs Clip: 49%  Labs (7/19): LDL 49 Labs (2/20): K 3.7, creatinine 1.43 Labs (4/20): K 4.3, creatinine 1.24 Labs (11/06/18): K 3.7 creatinine 1.23  Labs (05/07/19): K 3.8 Creatinine 1.5  Labs (07/18/19): K 4.3 Creatinine 1.42  Labs (5/21): K 3.3, creatinine 1.21 Labs (11/21): K 3.6, creatinine 1.4 Labs (12/21): K 3.3, creatinine 2.4, hgb 12.8 Labs (04/01/19): K 3.3 Creatinine 2.2  Labs (8/22): K 4.0, creatinine 1.6, HDL 35, LDL  36, a1c 7.5 Labs (2/23): LDL 31, HDL 36 Labs (4/23): K 3.3, creatinine 1.68 Labs (5/23): K 4.1, creatinine 2.24  PMH: 1. CAD: s/p CABG x 4 in [place] in 2005.  - Cardiolite (1/20): EF 47%, small fixed apical septal defect, no ischemia.  Low risk study.  - Cardiolite (12/22): small apical inferior defect, no changes from prior, no ischemia. Low risk  study. 2. Asthma 3. Gout 4. Atrial fibrillation: Chronic.  5. ESRD s/p renal transplantation at [facility] in 2012.  6. Anemia of chronic disease 7. Type II diabetes 8. HTN 9. Hyperlipidemia 10. OSA: On bipap.  11. Ischemic cardiomyopathy: EF 35-40% in past.  - Cardiolite (1/20) with EF 47%.  - Echo (1/20): EF 50-55%, mild MR, RV moderately dilated with moderately decreased systolic function, PASP 73 mmHg, moderate TR.   - Echo (3/20): EF 60-65%, PASP 75 mmHg, mildly dilated RV with mildly decreased systolic function.  - Echo (2/21): EF 45-50%, global HK, moderately dilated RV with moderately decreased systolic function, D-shaped interventricular septum, dilated IVC.  - RHC (2/21): mean RA 19, PA 78/31 mean 51, mean PCWP 26, CI 3.26, PVR 3.25 - Echo (5/23): EF 40-45%, RV mildly reduced, GIIIDD (restrictive) 12. Obesity: s/p gastric bypass in 2015.  13. Bradycardia with beta blocker.   Past Surgical History:  Procedure Laterality Date   BARIATRIC SURGERY     CARDIAC CATHETERIZATION     [facility]   CARDIAC CATHETERIZATION  05/03/2019   CATARACT EXTRACTION W/PHACO Right 10/29/2017   Procedure: CATARACT EXTRACTION PHACO AND INTRAOCULAR LENS PLACEMENT ([place])  RIGHT DIABETIC;  Surgeon: [name], MD;  Location: [place];  Service: Ophthalmology;  Laterality: Right;  Diabetic - insulin   CATARACT EXTRACTION W/PHACO Left 11/18/2017   Procedure: CATARACT EXTRACTION PHACO AND INTRAOCULAR LENS PLACEMENT ([place]) LEFT IVA/TOPICAL;  Surgeon: [name], MD;  Location: [place];  Service: Ophthalmology;  Laterality: Left;  DIABETES - insulin sleep apnea   COLONOSCOPY WITH PROPOFOL N/A 04/22/2013   Procedure: COLONOSCOPY WITH PROPOFOL;  Surgeon: [name], MD;  Location: [facility];  Service: Endoscopy;  Laterality: N/A;   CORONARY ARTERY BYPASS GRAFT  2005   X 4   DG ANGIO AV SHUNT*L*     right and left upper arms   FASCIOTOMY  03/03/2012   Procedure:  FASCIOTOMY;  Surgeon: [name], MD;  Location: [place];  Service: Orthopedics;  Laterality: Right;  FASCIOTOMY RIGHT SMALL FINGER   FASCIOTOMY Left 08/17/2013   Procedure: FASCIOTOMY LEFT RING;  Surgeon: [name], MD;  Location: [place];  Service: Orthopedics;  Laterality: Left;   INCISION AND DRAINAGE ABSCESS Left 10/15/2015   Procedure: INCISION AND DRAINAGE ABSCESS;  Surgeon: [name], MD;  Location: [facility];  Service: General;  Laterality: Left;   KIDNEY TRANSPLANT  09/13/2010   cadaver--at [facility] HEART CATH N/A 11/15/2016   Procedure: RIGHT HEART CATH;  Surgeon: [name], MD;  Location: [place] CV [facility];  Service: Cardiovascular;  Laterality: N/A;   RIGHT HEART CATH N/A 05/03/2019   Procedure: RIGHT HEART CATH;  Surgeon: [name], MD;  Location: [place] CV [facility];  Service: Cardiovascular;  Laterality: N/A;   TYMPANIC MEMBRANE REPAIR  1/12   left   VASECTOMY     Current Outpatient Medications  Medication Sig Dispense Refill   albuterol (PROVENTIL) (2.5 MG/3ML) 0.083% nebulizer solution Take 3 mLs (2.5 mg total) by nebulization every 6 (six)  hours as needed for wheezing or shortness of breath. 150 mL 0   albuterol (VENTOLIN HFA) 108 (90 Base) MCG/ACT inhaler Inhale 2 puffs into the lungs every 6 (six) hours as needed for wheezing or shortness of breath. 18 g 5   amoxicillin (AMOXIL) 500 MG capsule Take 4 capsules (2,000 mg total) by mouth as needed (Take 1 hr before dental appointment.). 4 capsule 5   apixaban (ELIQUIS) 5 MG TABS tablet TAKE 1 TABLET BY MOUTH TWICE DAILY 180 tablet 1   beclomethasone (QVAR) 80 MCG/ACT inhaler Inhale 2 puffs into the lungs 2 (two) times daily. 10.6 g 5   buPROPion (WELLBUTRIN SR) 150 MG 12 hr tablet TAKE 1 TABLET BY MOUTH TWICE DAILY 180 tablet 3   cholecalciferol (VITAMIN D3) 25 MCG (1000 UNIT) tablet Take 1,000 Units by mouth at bedtime.     colchicine 0.6 MG tablet Take 1 tablet  (0.6 mg total) by mouth every other day 30 tablet 1   Continuous Blood Gluc Sensor (FREESTYLE LIBRE 3 SENSOR) MISC Use one every 2 weeks. 6 each 3   fluticasone (FLONASE) 50 MCG/ACT nasal spray PLACE 2 SPRAYS INTO BOTH NOSTRILS DAILY. 16 g 11   gabapentin (NEURONTIN) 300 MG capsule TAKE 1 CAPSULE BY MOUTH TWICE DAILY 180 capsule 0   hydrALAZINE (APRESOLINE) 25 MG tablet Take 1/2 tablet (12.5 mg total) by mouth every 8 (eight) hours. 45 tablet 0   insulin glargine-yfgn (SEMGLEE) 100 UNIT/ML Pen Inject 40 Units subcutaneously once daily (Patient taking differently: Inject 40 Units into the skin at bedtime.) 45 mL 3   Insulin Pen Needle (UNIFINE PENTIPS) 31G X 5 MM MISC Use 4 times daily as directed 400 each 3   iron polysaccharides (FERREX 150) 150 MG capsule TAKE 1 CAPSULE BY MOUTH 2 TIMES DAILY. 180 capsule 1   metolazone (ZAROXOLYN) 2.5 MG tablet Take 1 tablet by mouth 2 days per week 10 tablet 6   montelukast (SINGULAIR) 10 MG tablet TAKE 1 TABLET BY MOUTH AT BEDTIME. 90 tablet 3   Multiple Vitamin (MULTIVITAMIN WITH MINERALS) TABS tablet Take 1 tablet by mouth daily.     mycophenolate (MYFORTIC) 180 MG EC tablet Take 360 mg by mouth 2 (two) times daily.     omeprazole (PRILOSEC) 20 MG capsule TAKE 1 CAPSULE BY MOUTH DAILY. (Patient taking differently: Take 20 mg by mouth at bedtime.) 90 capsule 3   Potassium Chloride CR (MICRO-K) 8 MEQ CPCR capsule CR Take 10 capsules (80 mEq total) by mouth 2 (two) times daily. 1800 capsule 3   predniSONE (DELTASONE) 5 MG tablet Take 1 tablet (5 mg total) by mouth daily. 90 tablet 3   rosuvastatin (CRESTOR) 10 MG tablet Take 1 tablet (10 mg total) by mouth daily. (Patient taking differently: Take 10 mg by mouth at bedtime.) 90 tablet 3   sulfamethoxazole-trimethoprim (BACTRIM) 400-80 MG tablet Take 1 tablet by mouth 3 (three) times a week. Monday, Wednesday, Friday     tacrolimus (PROGRAF) 1 MG capsule Take 2 capsules (2 mg total) by mouth 2 times daily. 120  capsule 5   tadalafil, PAH, (ADCIRCA) 20 MG tablet TAKE 1 TABLET (20 MG TOTAL) BY MOUTH EVERY OTHER DAY. 60 tablet 1   tamsulosin (FLOMAX) 0.4 MG CAPS capsule TAKE 1 CAPSULE BY MOUTH DAILY. 90 capsule 3   torsemide (DEMADEX) 100 MG tablet Take 1 tablet (100 mg total) by mouth 2 (two) times daily. 180 tablet 3   vitamin C (ASCORBIC ACID) 250 MG tablet Take 1  tablet (250 mg total) by mouth daily. 30 tablet 0   zinc sulfate 220 (50 Zn) MG capsule Take 220 mg by mouth daily.     No current facility-administered medications for this encounter.    Allergies:   Contrast media [iodinated contrast media] and Ibuprofen   Social History:  The patient  reports that he quit smoking about 26 years ago. His smoking use included cigars. He has never used smokeless tobacco. He reports current alcohol use. He reports that he does not use drugs.   Family History:  The patient's family history includes Breast cancer in his maternal grandmother; Diabetes in his maternal grandmother; Heart disease in his father; Kidney disease in his father; Kidney failure in his father; Liver cancer in his paternal grandmother and paternal uncle; Valvular heart disease in his mother.   ROS:  Please see the history of present illness.   All other systems are personally reviewed and negative.   Recent Labs: 08/15/2021: ALT 18; B Natriuretic Peptide 259.3 08/16/2021: Hemoglobin 12.5; Platelets 93 08/17/2021: BUN 57; Creatinine, Ser 2.24; Potassium 4.1; Sodium 140  Personally reviewed   Wt Readings from Last 3 Encounters:  08/23/21 123.8 kg  08/22/21 120.2 kg  08/17/21 120.9 kg    BP 120/78   Pulse (!) 53   Wt 123.8 kg   SpO2 95%   BMI 44.06 kg/m   Physical Exam: General:  NAD. No resp difficulty HEENT: Normal Neck: Supple. No JVD, thick neck. Carotids 2+ bilat; no bruits. No lymphadenopathy or thryomegaly appreciated. Cor: PMI nondisplaced. Irregular brady rate & rhythm. No rubs, gallops or murmurs. Lungs:  Clear Abdomen: Obese, nontender, nondistended. No hepatosplenomegaly. No bruits or masses. Good bowel sounds. Extremities: No cyanosis, clubbing, rash, edema, chronic venous stasis changes to BLE Neuro: Alert & oriented x 3, cranial nerves grossly intact. Moves all 4 extremities w/o difficulty. Affect pleasant.  Assessment & Plan: 1. Chronic Heart failure with Mid Range EF/ With prominent RV failure/pulmonary hypertension:  Ischemic cardiomyopathy.  ?If RV failure is related to severe OSA, he was a remote smoker.  Echo in 2/21 with EF 45-50%, moderately dilated RV with moderately decreased systolic function, moderate pulmonary hypertension. Recent admission 5/23 with volume overload. Echo this admit (5/23) EF 40-45%, RV mildly reduced, GIIIDD (restrictive). Improved NYHA II today, he does not appear volume overloaded today. ReDs 49% (? Accuracy). - Restart Farxiga 10 mg daily. - Continue torsemide 100 mg bid + metolazone twice a week (Tues and Fri). BMET and BNP today. Arlyce Harman and losartan on hold w/ recent bump in SCr.  - Continue hydralazine 12.5 mg tid for afterload reduction.  - No Imdur given tadalafil use for PH.  - Unable to confirm that his insurance will cover Cardiomems, so will manage him without this.   2. Atrial fibrillation: Chronic. Rate controlled.  - Continue Eliquis. No bleeding issues. CBC today. - Not on beta blocker with history of bradycardia.   3. CAD: S/p CABG 2005.  Cardiolite 12/22 with prior MI, no ischemia. - No chest pain.  - No ASA given Eliquis use.  - Continue Crestor 10 mg daily, good lipids 2/23. 4. CKD Stage 3: S/p renal transplantation in 2012. Followed [facility] center [facility]. He remains on mycophenolate and tacrolimus.  Recent AKI, SCr from 1.7>>2.2 range.  He needs post hospital follow up. He will call and arrange. - Keep off spiro and losartan for now. Plan to add back as able. - Avoid hypotension.  5. OSA:  Continue Bipap every night.  6.  HTN: BP stable. - Meds as above. 7. DM 2: A1c 7.6. On insulin.  - Restart dapagliflozin. 8. Pulmonary hypertension: Severe pulmonary hypertension by 3/20 echo.  RHC in 2/21 with moderate, primarily pulmonary venous hypertension (PVR 3.25 WU). Echo this admit w/ mildly reduced RV. RVSP not estimated. Suspect that there is also a component of group 3 PH (OSA, ? unrecognized OHS).  - Continue tadalafil 9. H/o Hyperkalemia: Off spiro and losartan. - BMET today.  Follow up in 3 weeks with APP and 2 months with [name], as scheduled.  Allena Katz, FNP-BC 08/23/21

## 2021-08-24 ENCOUNTER — Other Ambulatory Visit: Payer: Self-pay

## 2021-08-24 ENCOUNTER — Ambulatory Visit: Payer: 59 | Admitting: Nurse Practitioner

## 2021-08-25 DIAGNOSIS — E662 Morbid (severe) obesity with alveolar hypoventilation: Secondary | ICD-10-CM | POA: Diagnosis not present

## 2021-08-25 DIAGNOSIS — G4733 Obstructive sleep apnea (adult) (pediatric): Secondary | ICD-10-CM | POA: Diagnosis not present

## 2021-08-27 ENCOUNTER — Other Ambulatory Visit (HOSPITAL_COMMUNITY): Payer: Self-pay

## 2021-08-28 ENCOUNTER — Other Ambulatory Visit: Payer: Self-pay

## 2021-08-28 DIAGNOSIS — E1165 Type 2 diabetes mellitus with hyperglycemia: Secondary | ICD-10-CM | POA: Diagnosis not present

## 2021-08-29 ENCOUNTER — Other Ambulatory Visit: Payer: Self-pay

## 2021-08-29 MED ORDER — INSULIN LISPRO (1 UNIT DIAL) 100 UNIT/ML (KWIKPEN)
PEN_INJECTOR | SUBCUTANEOUS | 0 refills | Status: DC
  Filled 2021-08-29: qty 45, 90d supply, fill #0

## 2021-09-05 ENCOUNTER — Other Ambulatory Visit: Payer: Self-pay

## 2021-09-05 MED FILL — Bupropion HCl Tab ER 12HR 150 MG: ORAL | 90 days supply | Qty: 180 | Fill #1 | Status: AC

## 2021-09-07 ENCOUNTER — Other Ambulatory Visit: Payer: Self-pay

## 2021-09-17 ENCOUNTER — Encounter (HOSPITAL_COMMUNITY): Payer: Self-pay

## 2021-09-17 ENCOUNTER — Other Ambulatory Visit (HOSPITAL_COMMUNITY): Payer: Self-pay

## 2021-09-18 ENCOUNTER — Ambulatory Visit (INDEPENDENT_AMBULATORY_CARE_PROVIDER_SITE_OTHER): Payer: 59 | Admitting: Internal Medicine

## 2021-09-18 ENCOUNTER — Encounter: Payer: Self-pay | Admitting: Internal Medicine

## 2021-09-18 VITALS — BP 118/64 | HR 106 | Temp 97.2°F | Ht 70.5 in | Wt 273.0 lb

## 2021-09-18 DIAGNOSIS — E875 Hyperkalemia: Secondary | ICD-10-CM

## 2021-09-18 DIAGNOSIS — M1000 Idiopathic gout, unspecified site: Secondary | ICD-10-CM | POA: Diagnosis not present

## 2021-09-18 DIAGNOSIS — I5042 Chronic combined systolic (congestive) and diastolic (congestive) heart failure: Secondary | ICD-10-CM | POA: Diagnosis not present

## 2021-09-18 DIAGNOSIS — I2510 Atherosclerotic heart disease of native coronary artery without angina pectoris: Secondary | ICD-10-CM

## 2021-09-18 DIAGNOSIS — I482 Chronic atrial fibrillation, unspecified: Secondary | ICD-10-CM

## 2021-09-18 DIAGNOSIS — N1832 Chronic kidney disease, stage 3b: Secondary | ICD-10-CM

## 2021-09-18 LAB — CBC
HCT: 37.9 % — ABNORMAL LOW (ref 39.0–52.0)
Hemoglobin: 12.2 g/dL — ABNORMAL LOW (ref 13.0–17.0)
MCHC: 32.3 g/dL (ref 30.0–36.0)
MCV: 83.7 fl (ref 78.0–100.0)
Platelets: 127 10*3/uL — ABNORMAL LOW (ref 150.0–400.0)
RBC: 4.52 Mil/uL (ref 4.22–5.81)
RDW: 15.9 % — ABNORMAL HIGH (ref 11.5–15.5)
WBC: 4.9 10*3/uL (ref 4.0–10.5)

## 2021-09-18 LAB — RENAL FUNCTION PANEL
Albumin: 3.8 g/dL (ref 3.5–5.2)
BUN: 79 mg/dL — ABNORMAL HIGH (ref 6–23)
CO2: 31 mEq/L (ref 19–32)
Calcium: 9.4 mg/dL (ref 8.4–10.5)
Chloride: 97 mEq/L (ref 96–112)
Creatinine, Ser: 2.79 mg/dL — ABNORMAL HIGH (ref 0.40–1.50)
GFR: 23.63 mL/min — ABNORMAL LOW (ref >60.00)
Glucose, Bld: 172 mg/dL — ABNORMAL HIGH (ref 70–99)
Phosphorus: 3.6 mg/dL (ref 2.3–4.6)
Potassium: 3.7 mEq/L (ref 3.5–5.1)
Sodium: 140 mEq/L (ref 135–145)

## 2021-09-18 LAB — URIC ACID: Uric Acid, Serum: 16.1 mg/dL — ABNORMAL HIGH (ref 4.0–7.8)

## 2021-09-18 MED ORDER — POTASSIUM CHLORIDE ER 8 MEQ PO CPCR: 80.0000 meq | ORAL_CAPSULE | Freq: Two times a day (BID) | ORAL | 0 refills | Status: DC

## 2021-09-18 MED ORDER — POTASSIUM CHLORIDE ER 8 MEQ PO CPCR: 8.0000 meq | ORAL_CAPSULE | Freq: Two times a day (BID) | ORAL | 0 refills | Status: DC

## 2021-09-18 NOTE — Assessment & Plan Note (Signed)
Has noticed slight DOE --but weight stable (?related to heat) Is on the torsemide 100  bid and metolazone 2.5 twice a week farxiga 10, hydralazine 12.5 tid

## 2021-09-18 NOTE — Assessment & Plan Note (Signed)
Mostly due to too much supplement with his diuretics Now just on bid Will recheck labs

## 2021-09-18 NOTE — Assessment & Plan Note (Signed)
Flaring some Back to the colchicine Thinks he is taking allopurinol

## 2021-09-20 ENCOUNTER — Encounter (HOSPITAL_COMMUNITY): Payer: Self-pay

## 2021-09-20 ENCOUNTER — Ambulatory Visit (HOSPITAL_COMMUNITY)
Admission: RE | Admit: 2021-09-20 | Discharge: 2021-09-20 | Disposition: A | Discharge status: Home / Self Care | Payer: 59 | Source: Ambulatory Visit | Attending: Physician Assistant | Admitting: Physician Assistant

## 2021-09-20 VITALS — BP 150/80 | HR 64 | Wt 270.4 lb

## 2021-09-20 DIAGNOSIS — M109 Gout, unspecified: Secondary | ICD-10-CM | POA: Insufficient documentation

## 2021-09-20 DIAGNOSIS — I130 Hypertensive heart and chronic kidney disease with heart failure and stage 1 through stage 4 chronic kidney disease, or unspecified chronic kidney disease: Secondary | ICD-10-CM | POA: Diagnosis not present

## 2021-09-20 DIAGNOSIS — I272 Pulmonary hypertension, unspecified: Secondary | ICD-10-CM | POA: Diagnosis not present

## 2021-09-20 DIAGNOSIS — I482 Chronic atrial fibrillation, unspecified: Secondary | ICD-10-CM | POA: Diagnosis not present

## 2021-09-20 DIAGNOSIS — Z8249 Family history of ischemic heart disease and other diseases of the circulatory system: Secondary | ICD-10-CM | POA: Diagnosis not present

## 2021-09-20 DIAGNOSIS — Z940 Kidney transplant status: Secondary | ICD-10-CM | POA: Diagnosis not present

## 2021-09-20 DIAGNOSIS — I5022 Chronic systolic (congestive) heart failure: Secondary | ICD-10-CM | POA: Diagnosis not present

## 2021-09-20 DIAGNOSIS — I5042 Chronic combined systolic (congestive) and diastolic (congestive) heart failure: Secondary | ICD-10-CM | POA: Diagnosis not present

## 2021-09-20 DIAGNOSIS — I447 Left bundle-branch block, unspecified: Secondary | ICD-10-CM | POA: Diagnosis not present

## 2021-09-20 DIAGNOSIS — Z79899 Other long term (current) drug therapy: Secondary | ICD-10-CM | POA: Diagnosis not present

## 2021-09-20 DIAGNOSIS — N183 Chronic kidney disease, stage 3 unspecified: Secondary | ICD-10-CM | POA: Diagnosis not present

## 2021-09-20 DIAGNOSIS — Z7901 Long term (current) use of anticoagulants: Secondary | ICD-10-CM | POA: Insufficient documentation

## 2021-09-20 DIAGNOSIS — Z794 Long term (current) use of insulin: Secondary | ICD-10-CM | POA: Insufficient documentation

## 2021-09-20 DIAGNOSIS — I252 Old myocardial infarction: Secondary | ICD-10-CM | POA: Diagnosis not present

## 2021-09-20 DIAGNOSIS — Z87891 Personal history of nicotine dependence: Secondary | ICD-10-CM | POA: Insufficient documentation

## 2021-09-20 DIAGNOSIS — I2510 Atherosclerotic heart disease of native coronary artery without angina pectoris: Secondary | ICD-10-CM | POA: Diagnosis not present

## 2021-09-20 DIAGNOSIS — N1832 Chronic kidney disease, stage 3b: Secondary | ICD-10-CM | POA: Diagnosis not present

## 2021-09-20 DIAGNOSIS — G4733 Obstructive sleep apnea (adult) (pediatric): Secondary | ICD-10-CM | POA: Diagnosis not present

## 2021-09-20 DIAGNOSIS — I10 Essential (primary) hypertension: Secondary | ICD-10-CM | POA: Diagnosis not present

## 2021-09-20 DIAGNOSIS — E1122 Type 2 diabetes mellitus with diabetic chronic kidney disease: Secondary | ICD-10-CM | POA: Diagnosis not present

## 2021-09-20 DIAGNOSIS — I255 Ischemic cardiomyopathy: Secondary | ICD-10-CM | POA: Diagnosis not present

## 2021-09-20 DIAGNOSIS — Z833 Family history of diabetes mellitus: Secondary | ICD-10-CM | POA: Insufficient documentation

## 2021-09-20 DIAGNOSIS — Z951 Presence of aortocoronary bypass graft: Secondary | ICD-10-CM | POA: Diagnosis not present

## 2021-09-20 NOTE — Patient Instructions (Signed)
EKG done today.  No Labs done today.   No medication changes were made. Please continue all current medications as prescribed.  Your physician recommends that you schedule a follow-up appointment in: 2 weeks for a lab only appointment and [place].   Your physician recommends that you keep your scheduled follow-up appointment in August.  If you have any questions or concerns before your next appointment please send [name] a message through [place] or call our office at [phone].    TO LEAVE A MESSAGE FOR THE NURSE SELECT OPTION 2, PLEASE LEAVE A MESSAGE INCLUDING: YOUR NAME DATE OF BIRTH CALL BACK NUMBER REASON FOR CALL**this is important as we prioritize the call backs  YOU WILL RECEIVE A CALL BACK THE SAME DAY AS LONG AS YOU CALL BEFORE 4:00 PM   Do the following things EVERYDAY: Weigh yourself in the morning before breakfast. Write it down and keep it in a log. Take your medicines as prescribed Eat low salt foods--Limit salt (sodium) to 2000 mg per day.  Stay as active as you can everyday Limit all fluids for the day to less than 2 liters   At the [facility], you and your health needs are our priority. As part of our continuing mission to provide you with exceptional heart care, we have created designated Provider Care Teams. These Care Teams include your primary Cardiologist (physician) and Advanced Practice Providers (APPs- Physician Assistants and Nurse Practitioners) who all work together to provide you with the care you need, when you need it.   You may see any of the following providers on your designated Care Team at your next follow up: Dr Glori Bickers Dr Haynes Kerns, NP Lyda Jester, [place] Audry Riles, PharmD   Please be sure to bring in all your medications bottles to every appointment.

## 2021-09-20 NOTE — Progress Notes (Addendum)
Advanced Heart Failure Clinic Note   Date:  09/20/2021   ID:  [name]., DOB 02/11/1961, MRN [id number]   Provider location: [address] [place] Type of Visit:  Established patient  PCP:  [name], MD  Cardiologist:  [name] Nephrology: Dr. Ronnald Ramp  HF Cardiologist: [name]  HPI: [name]. is a 61 y.o. male  with a history of CAD s/p CABG 2005, chronic atrial fibrillation on Eliquis, ESRD s/p renal transplant 2012, anc chronic diastolic CHF.  His renal function has been fairly stable, most recent creatinine 1.43.  He has been struggling with volume overload/CHF.  He has a history of ischemic cardiomyopathy with EF as low as 35-40% in the past but echo in 3/20 showed EF 60-65%.  There is evidence for RV failure with pulmonary hypertension by echo.    Admitted 11/05/18 low grade fever and shortness of breath. HS Trop negative. Covid 19 negative. Blood CX negative. Placed on antibiotics and discharged to home the next day.   Admitted 03/30/03 with A/C Diastolic HF. Diuresed with IV lasix and transitioned to torsemide 80/40 mg . Discharge weight 252 pounds.   Admitted 06/2019 with left arm pain. Left upper extremity venous Doppler revealed occluded cephalic vein at the AV fistula outflow tract in the antecubital fossa with no other thrombus identified. Vascular evaluated. He continued on Eliquis.  No plan for intervention. Also treated for acute gout flare. Discharged on prednisone taper.     Given IV lasix on 12/21 by HF Paramedicine.   Admitted to [facility] 2/22 with acute gout flare vs cellulitis. Nephrology was consulted to evaluate transplant regimen and follow along. He was treated with antibiotics and prednisone. Discharged on decreased torsemide dose of 40 mg daily. Weight 259 lbs.  Admitted 4/22 for left leg cellulitis after sustaining a dog scratch, received IV antibiotics. No changes to his diuretic therapy. Discharge weight 266  lbs.  Follow up with [name], 1/23, he had CP and underwent Lexiscan myoview, which overall was a low risk study with evidence of prior infarct in the infero-apical area with no significant change from before. Losartan added to GDMT regimen.  Follow up 5/23, volume overloaded, REDs 40%. Advised taking additional metolazone (3 doses that week), however labs showed worsening renal function and advised to keep at 2 doses.  Admitted with a/c CHF and AKI.  Gareth Eagle and losartan stopped. Diuresed with IV lasix. Echo showed EF 40-45%, RV mildly reduced, GIIIDD (restrictive), IVC dilated. Nephrology consulted for diuretic dosing. Discharged home, weight 266 lbs.  He is here today for close follow-up. Home weight up a couple of lbs. Recently 267 lb>> baseline 265 lb. Has some chronic lower extremity swelling and wears compression stockings. Reports some dyspnea with exertion especially when outside pressure washing deck and doing yard work. Outdoors about 5 hrs today. No orthopnea or PND. Has been doing well with watching fluid and sodium intake.  Taking medications as prescribed. Home SBP averages in 120s. Wearing BiPAP nightly. Followed by [place]. .   ECG (personally reviewed): Afib 70 bpm, LBBB, QRS 156 ms  Labs (7/19): LDL 49 Labs (2/20): K 3.7, creatinine 1.43 Labs (4/20): K 4.3, creatinine 1.24 Labs (11/06/18): K 3.7 creatinine 1.23  Labs (05/07/19): K 3.8 Creatinine 1.5  Labs (07/18/19): K 4.3 Creatinine 1.42  Labs (5/21): K 3.3, creatinine 1.21 Labs (11/21): K 3.6, creatinine 1.4 Labs (12/21): K 3.3, creatinine 2.4, hgb 12.8 Labs (  04/01/19): K 3.3 Creatinine 2.2  Labs (8/22): K 4.0, creatinine 1.6, HDL 35, LDL 36, a1c 7.5 Labs (2/23): LDL 31, HDL 36 Labs (4/23): K 3.3, creatinine 1.68 Labs (5/23): K 4.1, creatinine 2.24  PMH: 1. CAD: s/p CABG x 4 in [place] in 2005.  - Cardiolite (1/20): EF 47%, small fixed apical septal defect, no ischemia.  Low risk study.  -  Cardiolite (12/22): small apical inferior defect, no changes from prior, no ischemia. Low risk study. 2. Asthma 3. Gout 4. Atrial fibrillation: Chronic.  5. ESRD s/p renal transplantation at [facility] in 2012.  6. Anemia of chronic disease 7. Type II diabetes 8. HTN 9. Hyperlipidemia 10. OSA: On bipap.  11. Ischemic cardiomyopathy: EF 35-40% in past.  - Cardiolite (1/20) with EF 47%.  - Echo (1/20): EF 50-55%, mild MR, RV moderately dilated with moderately decreased systolic function, PASP 73 mmHg, moderate TR.   - Echo (3/20): EF 60-65%, PASP 75 mmHg, mildly dilated RV with mildly decreased systolic function.  - Echo (2/21): EF 45-50%, global HK, moderately dilated RV with moderately decreased systolic function, D-shaped interventricular septum, dilated IVC.  - RHC (2/21): mean RA 19, PA 78/31 mean 51, mean PCWP 26, CI 3.26, PVR 3.25 - Echo (5/23): EF 40-45%, RV mildly reduced, GIIIDD (restrictive) 12. Obesity: s/p gastric bypass in 2015.  13. Bradycardia with beta blocker.   Past Surgical History:  Procedure Laterality Date   BARIATRIC SURGERY     CARDIAC CATHETERIZATION     [facility]   CARDIAC CATHETERIZATION  05/03/2019   CATARACT EXTRACTION W/PHACO Right 10/29/2017   Procedure: CATARACT EXTRACTION PHACO AND INTRAOCULAR LENS PLACEMENT ([place])  RIGHT DIABETIC;  Surgeon: [name], MD;  Location: [place];  Service: Ophthalmology;  Laterality: Right;  Diabetic - insulin   CATARACT EXTRACTION W/PHACO Left 11/18/2017   Procedure: CATARACT EXTRACTION PHACO AND INTRAOCULAR LENS PLACEMENT ([place]) LEFT IVA/TOPICAL;  Surgeon: [name], MD;  Location: [place];  Service: Ophthalmology;  Laterality: Left;  DIABETES - insulin sleep apnea   COLONOSCOPY WITH PROPOFOL N/A 04/22/2013   Procedure: COLONOSCOPY WITH PROPOFOL;  Surgeon: [name], MD;  Location: [facility];  Service: Endoscopy;  Laterality: N/A;   CORONARY ARTERY BYPASS GRAFT  2005   X 4    DG ANGIO AV SHUNT*L*     right and left upper arms   FASCIOTOMY  03/03/2012   Procedure: FASCIOTOMY;  Surgeon: [name], MD;  Location: [place];  Service: Orthopedics;  Laterality: Right;  FASCIOTOMY RIGHT SMALL FINGER   FASCIOTOMY Left 08/17/2013   Procedure: FASCIOTOMY LEFT RING;  Surgeon: [name], MD;  Location: [place];  Service: Orthopedics;  Laterality: Left;   INCISION AND DRAINAGE ABSCESS Left 10/15/2015   Procedure: INCISION AND DRAINAGE ABSCESS;  Surgeon: [name], MD;  Location: [facility];  Service: General;  Laterality: Left;   KIDNEY TRANSPLANT  09/13/2010   cadaver--at [facility] HEART CATH N/A 11/15/2016   Procedure: RIGHT HEART CATH;  Surgeon: [name], MD;  Location: [place] CV LAB;  Service: Cardiovascular;  Laterality: N/A;   RIGHT HEART CATH N/A 05/03/2019   Procedure: RIGHT HEART CATH;  Surgeon: [name], MD;  Location: [place] CV LAB;  Service: Cardiovascular;  Laterality: N/A;   TYMPANIC MEMBRANE REPAIR  1/12   left   VASECTOMY     Current Outpatient Medications  Medication Sig Dispense Refill   albuterol (PROVENTIL) (2.5  MG/3ML) 0.083% nebulizer solution Take 3 mLs (2.5 mg total) by nebulization every 6 (six) hours as needed for wheezing or shortness of breath. 150 mL 0   albuterol (VENTOLIN HFA) 108 (90 Base) MCG/ACT inhaler Inhale 2 puffs into the lungs every 6 (six) hours as needed for wheezing or shortness of breath. 18 g 5   allopurinol (ZYLOPRIM) 100 MG tablet Take 100 mg by mouth daily.     amoxicillin (AMOXIL) 500 MG capsule Take 4 capsules (2,000 mg total) by mouth as needed (Take 1 hr before dental appointment.). 4 capsule 5   apixaban (ELIQUIS) 5 MG TABS tablet TAKE 1 TABLET BY MOUTH TWICE DAILY 180 tablet 1   beclomethasone (QVAR) 80 MCG/ACT inhaler Inhale 2 puffs into the lungs 2 (two) times daily. 10.6 g 5   buPROPion (WELLBUTRIN SR) 150 MG 12 hr tablet TAKE 1 TABLET BY MOUTH  TWICE DAILY 180 tablet 3   cholecalciferol (VITAMIN D3) 25 MCG (1000 UNIT) tablet Take 1,000 Units by mouth at bedtime.     colchicine 0.6 MG tablet Take 1 tablet (0.6 mg total) by mouth every other day 30 tablet 1   Continuous Blood Gluc Sensor (FREESTYLE LIBRE 3 SENSOR) MISC Use one every 2 weeks. 6 each 3   dapagliflozin propanediol (FARXIGA) 10 MG TABS tablet Take 1 tablet (10 mg total) by mouth daily before breakfast. 30 tablet 11   fluticasone (FLONASE) 50 MCG/ACT nasal spray PLACE 2 SPRAYS INTO BOTH NOSTRILS DAILY. 16 g 11   gabapentin (NEURONTIN) 300 MG capsule TAKE 1 CAPSULE BY MOUTH TWICE DAILY 180 capsule 0   hydrALAZINE (APRESOLINE) 25 MG tablet Take 1/2 tablet (12.5 mg total) by mouth every 8 (eight) hours. 45 tablet 0   insulin glargine-yfgn (SEMGLEE) 100 UNIT/ML Pen Inject 40 Units subcutaneously once daily (Patient taking differently: Inject 40 Units into the skin at bedtime.) 45 mL 3   insulin lispro (HUMALOG KWIKPEN) 100 UNIT/ML KwikPen INJECT UP TO 50 UNITS UNDER THE SKIN DAILY AS DIRECTED 45 mL 0   Insulin Pen Needle (UNIFINE PENTIPS) 31G X 5 MM MISC Use 4 times daily as directed 400 each 3   iron polysaccharides (FERREX 150) 150 MG capsule TAKE 1 CAPSULE BY MOUTH 2 TIMES DAILY. 180 capsule 1   metolazone (ZAROXOLYN) 2.5 MG tablet Take 1 tablet by mouth 2 days per week 10 tablet 6   montelukast (SINGULAIR) 10 MG tablet TAKE 1 TABLET BY MOUTH AT BEDTIME. 90 tablet 3   Multiple Vitamin (MULTIVITAMIN WITH MINERALS) TABS tablet Take 1 tablet by mouth daily.     mycophenolate (MYFORTIC) 180 MG EC tablet Take 360 mg by mouth 2 (two) times daily.     omeprazole (PRILOSEC) 20 MG capsule Take 20 mg by mouth at bedtime.     Potassium Chloride CR (MICRO-K) 8 MEQ CPCR capsule CR Take 10 capsules (80 mEq total) by mouth 2 (two) times daily. 1 capsule 0   predniSONE (DELTASONE) 5 MG tablet Take 1 tablet (5 mg total) by mouth daily. 90 tablet 3   rosuvastatin (CRESTOR) 10 MG tablet Take 1  tablet (10 mg total) by mouth daily. 90 tablet 3   sulfamethoxazole-trimethoprim (BACTRIM) 400-80 MG tablet Take 1 tablet by mouth 3 (three) times a week. Monday, Wednesday, Friday     tacrolimus (PROGRAF) 1 MG capsule Take 2 capsules (2 mg total) by mouth 2 times daily. 120 capsule 5   tadalafil, PAH, (ADCIRCA) 20 MG tablet TAKE 1 TABLET (20 MG TOTAL) BY MOUTH  EVERY OTHER DAY. 60 tablet 1   tamsulosin (FLOMAX) 0.4 MG CAPS capsule TAKE 1 CAPSULE BY MOUTH DAILY. 90 capsule 3   torsemide (DEMADEX) 100 MG tablet Take 1 tablet (100 mg total) by mouth 2 (two) times daily. 180 tablet 3   zinc sulfate 220 (50 Zn) MG capsule Take 220 mg by mouth daily.     vitamin C (ASCORBIC ACID) 250 MG tablet Take 1 tablet (250 mg total) by mouth daily. (Patient not taking: Reported on 09/20/2021) 30 tablet 0   No current facility-administered medications for this encounter.    Allergies:   Contrast media [iodinated contrast media] and Ibuprofen   Social History:  The patient  reports that he quit smoking about 27 years ago. His smoking use included cigars. He has never been exposed to tobacco smoke. He has never used smokeless tobacco. He reports current alcohol use. He reports that he does not use drugs.   Family History:  The patient's family history includes Breast cancer in his maternal grandmother; Diabetes in his maternal grandmother; Heart disease in his father; Kidney disease in his father; Kidney failure in his father; Liver cancer in his paternal grandmother and paternal uncle; Valvular heart disease in his mother.   ROS:  Please see the history of present illness.   All other systems are personally reviewed and negative.   Recent Labs: 08/15/2021: ALT 18 08/23/2021: B Natriuretic Peptide 288.1 09/18/2021: BUN 79; Creatinine, Ser 2.79; Hemoglobin 12.2; Platelets 127.0; Potassium 3.7; Sodium 140  Personally reviewed   Wt Readings from Last 3 Encounters:  09/20/21 122.7 kg (270 lb 6.4 oz)  09/18/21 123.8  kg (273 lb)  09/17/21 119.7 kg (264 lb)    BP (!) 150/80   Pulse 64   Wt 122.7 kg (270 lb 6.4 oz)   SpO2 95%   BMI 38.25 kg/m   Physical Exam: General:  Well appearing. Ambulated into clinic HEENT: normal Neck: supple. Difficult to assess d/t neck size. Carotids 2+ bilat; no bruits. No lymphadenopathy or thryomegaly appreciated. Cor: PMI nondisplaced. Irregular rate & rhythm. No rubs, gallops or murmurs. Lungs: clear Abdomen: obese, soft, nontender, nondistended.  Extremities: no cyanosis, clubbing, rash, 1-2 + edema, chronic venous stasis changes b/l LE Neuro: alert & orientedx3, cranial nerves grossly intact. moves all 4 extremities w/o difficulty. Affect pleasant .  Assessment & Plan: 1. Chronic Heart failure with Mid Range EF/ With prominent RV failure/pulmonary hypertension:  Ischemic cardiomyopathy.  ?If RV failure is related to severe OSA, he was a remote smoker.  Echo in 2/21 with EF 45-50%, moderately dilated RV with moderately decreased systolic function, moderate pulmonary hypertension. Recent admission 5/23 with volume overload. Echo 5/23 EF 40-45%, RV mildly reduced, GIIIDD (restrictive).  - Improved NYHA II/early III. Mild volume overload on exam. He is due for metolazone tomorrow. Typically responds well to this. Can take an extra dose Sunday if needed.  - Continue Farxiga 10 mg daily. - Spiro and losartan on hold w/ recent bump in SCr.  - Continue hydralazine 12.5 mg tid for afterload reduction.  - No Imdur given tadalafil use for PH.  - Unable to confirm that his insurance will cover Cardiomems, so will manage him without this.   - Watch renal function closely with diuretics. BMET/BNP in 2 weeks. 2. Atrial fibrillation: Chronic. Rate controlled.  - Continue Eliquis. No bleeding issues.  - Not on beta blocker with history of bradycardia.   3. CAD: S/p CABG 2005.  Cardiolite 12/22 with prior MI,  no ischemia. - No chest pain.  - No ASA given Eliquis use.  - Continue  Crestor 10 mg daily, good lipids 2/23. 4. CKD Stage 3: S/p renal transplantation in 2012. Followed by [facility] center at [facility]. He remains on mycophenolate and tacrolimus.  Has f/u with transplant team in August.  - Recent AKI, SCr from 1.7>>2.2 range.  Scr up to 2.79 on recent labs 6/27. Will repeat BMET in 2 weeks. - Keep off spiro and losartan for now.  - Avoid hypotension.  5. OSA: Continue Bipap every night.  6. HTN: BP elevated today, but consistently controlled at home. - Meds as above. No change today. 7. DM 2: A1c 7.6. On insulin.  - Continue dapagliflozin - Has f/u with endocrine. 8. Pulmonary hypertension: Severe pulmonary hypertension by 3/20 echo.  RHC in 2/21 with moderate, primarily pulmonary venous hypertension (PVR 3.25 WU). Echo 05/23 w/ mildly reduced RV. RVSP not estimated. Suspect that there is also a component of group 3 PH (OSA, ? unrecognized OHS).  - Using BiPAP nightly. - Continue tadalafil 9. H/o Hyperkalemia: Off spiro and losartan. - K 3.7 on recent labs.  - Takes potassium chloride 80 mEq BID  Follow up: Labs 2 weeks, [name] in August as scheduled  Marlyce Huge, PA-C 09/20/21

## 2021-09-21 ENCOUNTER — Other Ambulatory Visit: Payer: Self-pay

## 2021-09-21 ENCOUNTER — Other Ambulatory Visit: Payer: Self-pay | Admitting: Cardiovascular Disease

## 2021-09-21 MED ORDER — SULFAMETHOXAZOLE-TRIMETHOPRIM 400-80 MG PO TABS
ORAL_TABLET | ORAL | 3 refills | Status: DC
  Filled 2021-09-21: qty 36, 84d supply, fill #0
  Filled 2021-12-18: qty 36, 84d supply, fill #1
  Filled 2022-03-15: qty 36, 84d supply, fill #2
  Filled 2022-06-17: qty 36, 84d supply, fill #3

## 2021-09-21 MED FILL — Polysaccharide Iron Complex Cap 150 MG (Iron Equivalent): ORAL | 90 days supply | Qty: 180 | Fill #1 | Status: AC

## 2021-09-24 ENCOUNTER — Other Ambulatory Visit: Payer: Self-pay

## 2021-09-24 DIAGNOSIS — E662 Morbid (severe) obesity with alveolar hypoventilation: Secondary | ICD-10-CM | POA: Diagnosis not present

## 2021-09-24 DIAGNOSIS — G4733 Obstructive sleep apnea (adult) (pediatric): Secondary | ICD-10-CM | POA: Diagnosis not present

## 2021-09-24 NOTE — Telephone Encounter (Signed)
Please schedule 6 month F/U appointment. This is a new med started in hospital. Thank you!

## 2021-09-26 ENCOUNTER — Other Ambulatory Visit: Payer: Self-pay

## 2021-09-27 DIAGNOSIS — E1165 Type 2 diabetes mellitus with hyperglycemia: Secondary | ICD-10-CM | POA: Diagnosis not present

## 2021-09-28 ENCOUNTER — Other Ambulatory Visit: Payer: Self-pay

## 2021-09-28 ENCOUNTER — Other Ambulatory Visit: Payer: Self-pay | Admitting: Cardiovascular Disease

## 2021-09-28 ENCOUNTER — Telehealth: Payer: Self-pay | Admitting: Cardiovascular Disease

## 2021-09-28 MED ORDER — TADALAFIL (PAH) 20 MG PO TABS
ORAL_TABLET | ORAL | 1 refills | Status: DC
Start: 2021-09-28 — End: 2021-10-01
  Filled 2021-09-28: qty 15, 30d supply, fill #0

## 2021-09-28 NOTE — Telephone Encounter (Signed)
*  STAT* If patient is at the pharmacy, call can be transferred to refill team.   1. Which medications need to be refilled? (please list name of each medication and dose if known) hydrALAZINE (APRESOLINE) 25 MG tablet tadalafil, PAH, (ADCIRCA) 20 MG tablet  2. Which pharmacy/location (including street and city if local pharmacy) is medication to be sent to? [facility]  3. Do they need a 30 day or 90 day supply? 90  Pt has appt with [name]  08/01

## 2021-09-30 ENCOUNTER — Other Ambulatory Visit: Payer: Self-pay

## 2021-10-01 ENCOUNTER — Other Ambulatory Visit: Payer: Self-pay

## 2021-10-01 DIAGNOSIS — Z7952 Long term (current) use of systemic steroids: Secondary | ICD-10-CM | POA: Diagnosis not present

## 2021-10-01 DIAGNOSIS — E669 Obesity, unspecified: Secondary | ICD-10-CM | POA: Diagnosis not present

## 2021-10-01 DIAGNOSIS — E1169 Type 2 diabetes mellitus with other specified complication: Secondary | ICD-10-CM | POA: Diagnosis not present

## 2021-10-01 DIAGNOSIS — E1122 Type 2 diabetes mellitus with diabetic chronic kidney disease: Secondary | ICD-10-CM | POA: Diagnosis not present

## 2021-10-01 DIAGNOSIS — E1142 Type 2 diabetes mellitus with diabetic polyneuropathy: Secondary | ICD-10-CM | POA: Diagnosis not present

## 2021-10-01 DIAGNOSIS — E1159 Type 2 diabetes mellitus with other circulatory complications: Secondary | ICD-10-CM | POA: Diagnosis not present

## 2021-10-01 DIAGNOSIS — Z794 Long term (current) use of insulin: Secondary | ICD-10-CM | POA: Diagnosis not present

## 2021-10-01 DIAGNOSIS — N184 Chronic kidney disease, stage 4 (severe): Secondary | ICD-10-CM | POA: Diagnosis not present

## 2021-10-01 DIAGNOSIS — E113293 Type 2 diabetes mellitus with mild nonproliferative diabetic retinopathy without macular edema, bilateral: Secondary | ICD-10-CM | POA: Diagnosis not present

## 2021-10-01 MED ORDER — HYDRALAZINE HCL 25 MG PO TABS
12.5000 mg | ORAL_TABLET | Freq: Three times a day (TID) | ORAL | 0 refills | Status: DC
Start: 2021-10-01 — End: 2022-01-22
  Filled 2021-12-04: qty 45, 30d supply, fill #0

## 2021-10-01 MED ORDER — TADALAFIL (PAH) 20 MG PO TABS
ORAL_TABLET | ORAL | 1 refills | Status: DC
  Filled 2021-10-30: qty 15, 30d supply, fill #0

## 2021-10-01 MED FILL — Hydralazine HCl Tab 25 MG: ORAL | 30 days supply | Qty: 45 | Fill #0 | Status: AC

## 2021-10-01 NOTE — Telephone Encounter (Signed)
hydrALAZINE and tadalafil sent to [place]

## 2021-10-03 ENCOUNTER — Ambulatory Visit (INDEPENDENT_AMBULATORY_CARE_PROVIDER_SITE_OTHER): Payer: 59 | Admitting: Physician Assistant

## 2021-10-03 VITALS — BP 144/75 | HR 72 | Ht 70.0 in | Wt 263.0 lb

## 2021-10-03 DIAGNOSIS — N401 Enlarged prostate with lower urinary tract symptoms: Secondary | ICD-10-CM

## 2021-10-03 DIAGNOSIS — N5082 Scrotal pain: Secondary | ICD-10-CM

## 2021-10-03 DIAGNOSIS — E291 Testicular hypofunction: Secondary | ICD-10-CM | POA: Diagnosis not present

## 2021-10-03 DIAGNOSIS — N138 Other obstructive and reflux uropathy: Secondary | ICD-10-CM

## 2021-10-03 LAB — BLADDER SCAN AMB NON-IMAGING

## 2021-10-03 NOTE — Progress Notes (Signed)
10/03/2021 4:24 PM   [name] Sep 03, 1960 [id number]  CC: Chief Complaint  Patient presents with   Benign Prostatic Hypertrophy   HPI: Carl Ivory Bail. is a 61 y.o. male with PMH DM2, CKD 4 s/p renal transplant in 2014 (RLQ), CAD, A-fib on Eliquis, OSA, gastric bypass, and BPH on Flomax 0.4 mg daily per his PCP who presents today to discuss difficulty urinating.  He was last seen in our clinic in 2021 by Dr. Bernardo Heater for management of ED.  Today he reports an approximate 4 to 6-week history of urinary hesitancy and postvoid dribbling without intermittency, leakage, dysuria, or gross hematuria.    Over the same time, he feels that he has had some bilateral testicular enlargement associated with intermittent heaviness/shooting pains.  He is also curious about his testosterone levels, stating that he previously had "pellets" implanted in his buttock to manage this.  He denies fatigue, muscle loss, or hair loss and is primarily concerned about his erectile dysfunction.  Per chart review, he has a history of hypogonadism but it does not look like this has been treated since approximately 2017.  He reports orthostasis.    In office UA today notable for 1+ glucose; urine microscopy pan negative.  PVR 190 mL.  PMH: Past Medical History:  Diagnosis Date   Asthma, persistent controlled 02/25/2013   Attention or concentration deficit 04/26/2021   BPH with obstruction/lower urinary tract symptoms 09/25/2014   CAD (coronary artery disease) 2005   a.  Status post four-vessel CABG in 2005; b. 03/2018 MV: Small, fixed inferoapical and apical septal defect. No ischemia. EF 47% (60-65% by 05/2018 Echo).   Cellulitis and abscess of left leg 07/22/2020   Chronic atrial fibrillation    a. CHADS2VASc 4 (CHF, HTN, DM, vascular disease)--> Eliquis; b. 09/2018 Zio: Chronic Afib.   Chronic combined systolic and diastolic CHF (congestive heart failure)    a.  7/18 Echo: EF 45-50%; b.  05/2018 Echo: EF 60-65%, RVSP 74.8; c. 12/2018 Echo: EF 50-55%. Sev dil LA; d. 04/2019 Echo: EF 45-50%, glob HK. Mod reduced RV fxn. RVSP 53.16mHg.   Chronic venous stasis dermatitis of both lower extremities    Cytomegaloviral disease 2017   Diverticulosis of colon 04/22/2013   Essential hypertension 12/17/2010   GERD (gastroesophageal reflux disease)    Gout attack 03/30/2018   History of renal transplant 03/01/2013   Hyperlipidemia    low since renal failure   Hypogonadism in male 09/25/2014   Major depressive disorder 10/04/2013   Obesity hypoventilation syndrome 03/31/2012   Obstructive sleep apnea 09/29/2012   NPSG [place] 03/15/12- AHI 57.9/ hr, desaturation to 78.8%, body weight 310 lbs   PAH (pulmonary artery hypertension)    a. 05/2018 Echo: RVSP 74.860mg; b. 12/2018 RV not well visualized; c. 04/2019 RHC: RA 19, RV 80/20, PA 78/31 (51), PCWP 25, CO/CI 7.69/3.26. PVR 3.25 WU-->Sev PAH, likely primarily PV HTN.   Synovitis of finger 06/11/2019   Trigger finger, unspecified little finger 12/23/2018   Type 2 diabetes mellitus with hyperglycemia, with long-term current use of insulin 09/09/2014   Ulcer 05/2016   Left shin   Wears glasses     Surgical History: Past Surgical History:  Procedure Laterality Date   BARIATRIC SURGERY     [facility]  [facility]   CATARACT EXTRACTION W/PHACO Right 10/29/2017   Procedure: CATARACT EXTRACTION PHACO AND INTRAOCULAR LENS PLACEMENT ([place] RIGHT DIABETIC;  Surgeon: BrWallace Going  Nila Nephew, MD;  Location: [place];  Service: Ophthalmology;  Laterality: Right;  Diabetic - insulin   CATARACT EXTRACTION W/PHACO Left 11/18/2017   Procedure: CATARACT EXTRACTION PHACO AND INTRAOCULAR LENS PLACEMENT ([place]) LEFT IVA/TOPICAL;  Surgeon: [name], MD;  Location: [place];  Service: Ophthalmology;  Laterality: Left;  DIABETES - insulin sleep apnea   COLONOSCOPY WITH PROPOFOL N/A  04/22/2013   Procedure: COLONOSCOPY WITH PROPOFOL;  Surgeon: [name], MD;  Location: [facility];  Service: Endoscopy;  Laterality: N/A;   CORONARY ARTERY BYPASS GRAFT  2005   X 4   DG ANGIO AV SHUNT*L*     right and left upper arms   FASCIOTOMY  03/03/2012   Procedure: FASCIOTOMY;  Surgeon: [name], MD;  Location: [place];  Service: Orthopedics;  Laterality: Right;  FASCIOTOMY RIGHT SMALL FINGER   FASCIOTOMY Left 08/17/2013   Procedure: FASCIOTOMY LEFT RING;  Surgeon: [name], MD;  Location: [place];  Service: Orthopedics;  Laterality: Left;   INCISION AND DRAINAGE ABSCESS Left 10/15/2015   Procedure: INCISION AND DRAINAGE ABSCESS;  Surgeon: [name], MD;  Location: [facility];  Service: General;  Laterality: Left;   KIDNEY TRANSPLANT  09/13/2010   cadaver--at [facility] HEART CATH N/A 11/15/2016   Procedure: RIGHT HEART CATH;  Surgeon: [name], MD;  Location: [place] [facility];  Service: Cardiovascular;  Laterality: N/A;   RIGHT HEART CATH N/A 05/03/2019   Procedure: RIGHT HEART CATH;  Surgeon: [name], MD;  Location: [place] [facility];  Service: Cardiovascular;  Laterality: N/A;   TYMPANIC MEMBRANE REPAIR  1/12   left   VASECTOMY      Home Medications:  Allergies as of 10/03/2021       Reactions   Contrast Media [iodinated Contrast Media] Other (See Comments)   Kidney transplant   Iodine Other (See Comments)   Other reaction(s): Other (See Comments) Kidney transplant   Ibuprofen Other (See Comments)   Due to kidney transplant Due to kidney transplant Due to kidney transplant        Medication List        Accurate as of October 03, 2021  4:24 PM. If you have any questions, ask your nurse or doctor.          albuterol 108 (90 Base) MCG/ACT inhaler Commonly known as: VENTOLIN HFA Inhale 2 puffs into the lungs every 6 (six) hours as needed for wheezing or shortness of breath.   albuterol  (2.5 MG/3ML) 0.083% nebulizer solution Commonly known as: PROVENTIL Inhale one vial (3 ml) via nebulizer every 6 hours as needed for wheezing or shortness of breath (Take 3 mLs (2.5 mg total) by nebulization every 6 (six) hours as needed for wheezing or shortness of breath.)   allopurinol 100 MG tablet Commonly known as: ZYLOPRIM Take 100 mg by mouth daily.   amoxicillin 500 MG capsule Commonly known as: AMOXIL Take 4 capsules (2081m) by mouth one hour before dnetal appointment as needed (Take 4 capsules (2,000 mg total) by mouth as needed (Take 1 hr before dental appointment.).)   beclomethasone 80 MCG/ACT inhaler Commonly known as: QVAR Inhale 2 puffs into the lungs 2 (two) times daily.   buPROPion 150 MG 12 hr tablet Commonly known as: WELLBUTRIN SR TAKE 1 TABLET BY MOUTH TWICE DAILY   cholecalciferol 25 MCG (1000 UNIT) tablet Commonly known as: VITAMIN D3 Take 1,000 Units by mouth at bedtime.  colchicine 0.6 MG tablet Take 1 tablet (0.6 mg total) by mouth every other day   dapagliflozin propanediol 10 MG Tabs tablet Commonly known as: Farxiga Take 1 tablet (10 mg total) by mouth daily before breakfast.   Eliquis 5 MG Tabs tablet Generic drug: apixaban TAKE 1 TABLET BY MOUTH TWICE DAILY   Ferrex 150 150 MG capsule Generic drug: iron polysaccharides TAKE 1 CAPSULE BY MOUTH 2 TIMES DAILY.   fluticasone 50 MCG/ACT nasal spray Commonly known as: FLONASE PLACE 2 SPRAYS INTO BOTH NOSTRILS DAILY.   FreeStyle Libre 3 Sensor Misc Use one every 2 weeks.   gabapentin 300 MG capsule Commonly known as: NEURONTIN TAKE 1 CAPSULE BY MOUTH TWICE DAILY   hydrALAZINE 25 MG tablet Commonly known as: APRESOLINE Take 1/2 tablet (12.5 mg total) by mouth every 8 (eight) hours.   insulin glargine-yfgn 100 UNIT/ML Pen Commonly known as: SEMGLEE Inject 40 Units subcutaneously once daily What changed:  how much to take when to take this   insulin lispro 100 UNIT/ML  KwikPen Commonly known as: HumaLOG KwikPen INJECT UP TO 50 UNITS UNDER THE SKIN DAILY AS DIRECTED   metolazone 2.5 MG tablet Commonly known as: ZAROXOLYN Take 1 tablet by mouth 2 days per week   montelukast 10 MG tablet Commonly known as: SINGULAIR TAKE 1 TABLET BY MOUTH AT BEDTIME.   multivitamin with minerals Tabs tablet Take 1 tablet by mouth daily.   mycophenolate 180 MG EC tablet Commonly known as: MYFORTIC Take 360 mg by mouth 2 (two) times daily.   omeprazole 20 MG capsule Commonly known as: PRILOSEC Take 20 mg by mouth at bedtime.   Potassium Chloride CR 8 MEQ Cpcr capsule CR Commonly known as: MICRO-K Take 10 capsules (80 mEq total) by mouth 2 (two) times daily.   predniSONE 5 MG tablet Commonly known as: DELTASONE Take 1 tablet (5 mg total) by mouth daily.   rosuvastatin 10 MG tablet Commonly known as: CRESTOR Take 1 tablet (10 mg total) by mouth daily.   sulfamethoxazole-trimethoprim 400-80 MG tablet Commonly known as: BACTRIM Take 1 tablet by mouth 3 (three) times a week. Monday, Wednesday, Friday   sulfamethoxazole-trimethoprim 400-80 MG tablet Commonly known as: BACTRIM TAKE 1 TABLET BY MOUTH EVERY MONDAY, WEDNESDAY, FRIDAY.   tacrolimus 1 MG capsule Commonly known as: PROGRAF Take 2 capsules (2 mg total) by mouth 2 times daily.   tadalafil (PAH) 20 MG tablet Commonly known as: ADCIRCA TAKE 1 TABLET (20 MG TOTAL) BY MOUTH EVERY OTHER DAY.   tamsulosin 0.4 MG Caps capsule Commonly known as: FLOMAX TAKE 1 CAPSULE BY MOUTH DAILY.   torsemide 100 MG tablet Commonly known as: DEMADEX Take 1 tablet (100 mg total) by mouth 2 (two) times daily.   Unifine Pentips 31G X 5 MM Misc Generic drug: Insulin Pen Needle Use 4 times daily as directed   vitamin C 250 MG tablet Commonly known as: ASCORBIC ACID Take 1 tablet (250 mg total) by mouth daily.   zinc sulfate 220 (50 Zn) MG capsule Take 220 mg by mouth daily.        Allergies:  Allergies   Allergen Reactions   Contrast Media [Iodinated Contrast Media] Other (See Comments)    Kidney transplant   Iodine Other (See Comments)    Other reaction(s): Other (See Comments) Kidney transplant   Ibuprofen Other (See Comments)    Due to kidney transplant Due to kidney transplant Due to kidney transplant    Family History: Family History  Problem  Relation Age of Onset   Heart disease Father    Kidney failure Father    Kidney disease Father    Diabetes Maternal Grandmother    Breast cancer Maternal Grandmother    Valvular heart disease Mother    Liver cancer Paternal Uncle    Liver cancer Paternal Grandmother    Prostate cancer Neg Hx     Social History:   reports that he quit smoking about 27 years ago. His smoking use included cigars. He has never been exposed to tobacco smoke. He has never used smokeless tobacco. He reports current alcohol use. He reports that he does not use drugs.  Physical Exam: BP (!) 144/75   Pulse 72   Ht _0  (1.778 m)   Wt 263 lb (119.3 kg)   BMI 37.74 kg/m   Constitutional:  Alert and oriented, no acute distress, nontoxic appearing HEENT: [place], AT Cardiovascular: No clubbing, cyanosis, or edema Respiratory: Normal respiratory effort, no increased work of breathing GU: Bilateral descended testicles.  Nontender bilateral epididymides.  Spermatic cord thickening with possible varicocele, L>R. DRE: Normal sphincter tone, smooth and symmetrically enlarged 40+ cc prostate without nodules or induration Skin: No rashes, bruises or suspicious lesions Neurologic: Grossly intact, no focal deficits, moving all 4 extremities Psychiatric: Normal mood and affect  Laboratory Data: Results for orders placed or performed in visit on 10/03/21  Bladder Scan (Post Void Residual) in office  Result Value Ref Range   Scan Result 127m    *Note: Due to a large number of results and/or encounters for the requested time period, some results have not been  displayed. A complete set of results can be found in Results Review.   Assessment & Plan:   1. BPH with obstruction/lower urinary tract symptoms Incomplete bladder emptying noted in clinic today, though he is not in overt retention.  He already has some orthostasis, so I will defer increasing Flomax at this time.  UA is benign today.  We discussed proceeding with cystoscopy TRUS for consideration of possible bladder outlet procedures.  Patient is in agreement with this plan. - Urinalysis, Complete - Bladder Scan (Post Void Residual) in office  2. Hypogonadism in male Untreated for approximately 6 years per chart review.  We will recheck a testosterone today. - Testosterone  3. Scrotal pain Possible varicocele on physical exam today.  Will obtain scrotal ultrasound prior to his next appointment for further evaluation. - [place]CROTUM W/DOPPLER; Future  Return in about 4 weeks (around 10/31/2021) for PSA, cysto, TRUS, scrotal [place]ore[place]ults with Dr. SBernardo Heater  SDebroah Loop PA-C  [facility] [address] [place] [phone]

## 2021-10-04 LAB — MICROSCOPIC EXAMINATION
Bacteria, UA: NONE SEEN
Epithelial Cells (non renal): NONE SEEN /hpf (ref 0–10)
WBC, UA: NONE SEEN /hpf (ref 0–5)

## 2021-10-04 LAB — URINALYSIS, COMPLETE
Bilirubin, UA: NEGATIVE
Ketones, UA: NEGATIVE
Leukocytes,UA: NEGATIVE
Nitrite, UA: NEGATIVE
Protein,UA: NEGATIVE
RBC, UA: NEGATIVE
Specific Gravity, UA: 1.01 (ref 1.005–1.030)
Urobilinogen, Ur: 0.2 mg/dL (ref 0.2–1.0)
pH, UA: 5 (ref 5.0–7.5)

## 2021-10-04 LAB — TESTOSTERONE: Testosterone: 168 ng/dL — ABNORMAL LOW (ref 264–916)

## 2021-10-05 ENCOUNTER — Telehealth: Payer: Self-pay | Admitting: *Deleted

## 2021-10-05 DIAGNOSIS — E291 Testicular hypofunction: Secondary | ICD-10-CM

## 2021-10-05 NOTE — Telephone Encounter (Signed)
Incoming call from pt who states he is returning our call, informed pt of the information below. Pt voiced understanding. Testosterone ordered.

## 2021-10-05 NOTE — Telephone Encounter (Signed)
-----  Message from Debroah Loop, [place] sent at 10/04/2021  9:53 AM EDT ----- His testosterone is low, will need to repeat it to verify in order to pursue treatment. I've added this on to his upcoming appt with Dr. Bernardo Heater. He sent me a MyChart message separately, addressing his questions here: Hyaline casts are completely normal in the urine, nothing to worry about. He does not have any blood in his urine which is why his RBCs are listed as 0-2.

## 2021-10-05 NOTE — Telephone Encounter (Signed)
.  left message to have patient return my call.

## 2021-10-06 ENCOUNTER — Other Ambulatory Visit: Payer: Self-pay

## 2021-10-09 ENCOUNTER — Other Ambulatory Visit: Payer: Self-pay

## 2021-10-09 MED FILL — Omeprazole Cap Delayed Release 20 MG: ORAL | 90 days supply | Qty: 90 | Fill #3 | Status: AC

## 2021-10-10 ENCOUNTER — Other Ambulatory Visit: Payer: Self-pay

## 2021-10-10 DIAGNOSIS — E662 Morbid (severe) obesity with alveolar hypoventilation: Secondary | ICD-10-CM | POA: Diagnosis not present

## 2021-10-10 DIAGNOSIS — G4733 Obstructive sleep apnea (adult) (pediatric): Secondary | ICD-10-CM | POA: Diagnosis not present

## 2021-10-10 MED ORDER — INSULIN LISPRO (1 UNIT DIAL) 100 UNIT/ML (KWIKPEN)
PEN_INJECTOR | SUBCUTANEOUS | 3 refills | Status: DC
  Filled 2021-10-10: qty 45, 90d supply, fill #0
  Filled 2022-02-20: qty 45, 90d supply, fill #1

## 2021-10-16 ENCOUNTER — Encounter (HOSPITAL_COMMUNITY): Payer: Self-pay

## 2021-10-16 ENCOUNTER — Other Ambulatory Visit (HOSPITAL_COMMUNITY): Payer: Self-pay

## 2021-10-16 NOTE — Progress Notes (Signed)
Today had a home visit with Roth.  He just got back from traveling to [place].  He did well during his week vacation.  He did not gain any fluids or weight.  Did well traveling in the car.  He watched what he ate and drank.  He took all his medications.  He states been doing well.  He states prostate is enlarged and seeing urology for that.  He states sometime he strains and wait to urinate.  He states is urinating though, just takes a minute to come out.  He is planning a larger vacation with family which will be about 3 weeks of driving off and on.  He has everything for daily living.  He has no problems affording his medications.  He has all his medications and aware of how to take them.  Lungs are clear, he denies any chest pain, headaches, dizziness or increased shortness of breath.  His legs are healing and he has not had any falls or hitting his legs.  He stays busy outside when home gardening.  His mood is good, he loves to travel.  Will continue to visit for heart failure, diet and medication management.   [place] [phone]

## 2021-10-22 ENCOUNTER — Other Ambulatory Visit: Payer: Self-pay

## 2021-10-22 MED ORDER — FEBUXOSTAT 80 MG PO TABS
ORAL_TABLET | ORAL | 5 refills | Status: DC
  Filled 2021-10-22 (×3): qty 30, 30d supply, fill #0

## 2021-10-22 MED ORDER — COLCHICINE 0.6 MG PO TABS
ORAL_TABLET | ORAL | 1 refills | Status: DC
  Filled 2021-10-22: qty 30, 60d supply, fill #0
  Filled 2022-02-15: qty 30, 60d supply, fill #1

## 2021-10-23 ENCOUNTER — Ambulatory Visit: Payer: 59 | Admitting: Cardiovascular Disease

## 2021-10-24 ENCOUNTER — Other Ambulatory Visit: Payer: Self-pay

## 2021-10-24 DIAGNOSIS — K439 Ventral hernia without obstruction or gangrene: Secondary | ICD-10-CM | POA: Diagnosis not present

## 2021-10-24 DIAGNOSIS — I272 Pulmonary hypertension, unspecified: Secondary | ICD-10-CM | POA: Diagnosis not present

## 2021-10-24 DIAGNOSIS — Z940 Kidney transplant status: Secondary | ICD-10-CM | POA: Diagnosis not present

## 2021-10-24 DIAGNOSIS — Z79899 Other long term (current) drug therapy: Secondary | ICD-10-CM | POA: Diagnosis not present

## 2021-10-24 DIAGNOSIS — I4891 Unspecified atrial fibrillation: Secondary | ICD-10-CM | POA: Diagnosis not present

## 2021-10-24 DIAGNOSIS — Z4822 Encounter for aftercare following kidney transplant: Secondary | ICD-10-CM | POA: Diagnosis not present

## 2021-10-24 DIAGNOSIS — R609 Edema, unspecified: Secondary | ICD-10-CM | POA: Diagnosis not present

## 2021-10-24 DIAGNOSIS — Z87891 Personal history of nicotine dependence: Secondary | ICD-10-CM | POA: Diagnosis not present

## 2021-10-24 DIAGNOSIS — I10 Essential (primary) hypertension: Secondary | ICD-10-CM | POA: Diagnosis not present

## 2021-10-24 DIAGNOSIS — E785 Hyperlipidemia, unspecified: Secondary | ICD-10-CM | POA: Diagnosis not present

## 2021-10-24 DIAGNOSIS — G4733 Obstructive sleep apnea (adult) (pediatric): Secondary | ICD-10-CM | POA: Diagnosis not present

## 2021-10-24 DIAGNOSIS — Z794 Long term (current) use of insulin: Secondary | ICD-10-CM | POA: Diagnosis not present

## 2021-10-24 DIAGNOSIS — Z79621 Long term (current) use of calcineurin inhibitor: Secondary | ICD-10-CM | POA: Diagnosis not present

## 2021-10-24 DIAGNOSIS — Z792 Long term (current) use of antibiotics: Secondary | ICD-10-CM | POA: Diagnosis not present

## 2021-10-24 DIAGNOSIS — I50810 Right heart failure, unspecified: Secondary | ICD-10-CM | POA: Diagnosis not present

## 2021-10-24 DIAGNOSIS — E8729 Other acidosis: Secondary | ICD-10-CM | POA: Diagnosis not present

## 2021-10-24 DIAGNOSIS — D649 Anemia, unspecified: Secondary | ICD-10-CM | POA: Diagnosis not present

## 2021-10-24 DIAGNOSIS — Z9889 Other specified postprocedural states: Secondary | ICD-10-CM | POA: Diagnosis not present

## 2021-10-24 DIAGNOSIS — I482 Chronic atrial fibrillation, unspecified: Secondary | ICD-10-CM | POA: Diagnosis not present

## 2021-10-24 DIAGNOSIS — E876 Hypokalemia: Secondary | ICD-10-CM | POA: Diagnosis not present

## 2021-10-24 DIAGNOSIS — I110 Hypertensive heart disease with heart failure: Secondary | ICD-10-CM | POA: Diagnosis not present

## 2021-10-24 DIAGNOSIS — D509 Iron deficiency anemia, unspecified: Secondary | ICD-10-CM | POA: Diagnosis not present

## 2021-10-24 DIAGNOSIS — Z5181 Encounter for therapeutic drug level monitoring: Secondary | ICD-10-CM | POA: Diagnosis not present

## 2021-10-24 DIAGNOSIS — E119 Type 2 diabetes mellitus without complications: Secondary | ICD-10-CM | POA: Diagnosis not present

## 2021-10-24 DIAGNOSIS — Z7901 Long term (current) use of anticoagulants: Secondary | ICD-10-CM | POA: Diagnosis not present

## 2021-10-24 DIAGNOSIS — D849 Immunodeficiency, unspecified: Secondary | ICD-10-CM | POA: Diagnosis not present

## 2021-10-24 DIAGNOSIS — Z7984 Long term (current) use of oral hypoglycemic drugs: Secondary | ICD-10-CM | POA: Diagnosis not present

## 2021-10-24 DIAGNOSIS — Z9884 Bariatric surgery status: Secondary | ICD-10-CM | POA: Diagnosis not present

## 2021-10-24 MED ORDER — PREDNISONE 5 MG PO TABS
5.0000 mg | ORAL_TABLET | Freq: Every day | ORAL | 3 refills | Status: DC
  Filled 2021-10-24: qty 90, 90d supply, fill #0
  Filled 2022-02-19: qty 90, 90d supply, fill #1

## 2021-10-25 ENCOUNTER — Other Ambulatory Visit: Payer: Self-pay

## 2021-10-25 DIAGNOSIS — G4733 Obstructive sleep apnea (adult) (pediatric): Secondary | ICD-10-CM | POA: Diagnosis not present

## 2021-10-25 DIAGNOSIS — E662 Morbid (severe) obesity with alveolar hypoventilation: Secondary | ICD-10-CM | POA: Diagnosis not present

## 2021-10-28 DIAGNOSIS — E1165 Type 2 diabetes mellitus with hyperglycemia: Secondary | ICD-10-CM | POA: Diagnosis not present

## 2021-10-29 ENCOUNTER — Other Ambulatory Visit: Payer: Self-pay

## 2021-10-30 ENCOUNTER — Ambulatory Visit
Admission: RE | Admit: 2021-10-30 | Discharge: 2021-10-30 | Disposition: A | Discharge status: Home / Self Care | Payer: 59 | Source: Ambulatory Visit | Attending: Physician Assistant | Admitting: Physician Assistant

## 2021-10-30 ENCOUNTER — Encounter: Payer: Self-pay | Admitting: Internal Medicine

## 2021-10-30 ENCOUNTER — Ambulatory Visit (INDEPENDENT_AMBULATORY_CARE_PROVIDER_SITE_OTHER): Payer: 59 | Admitting: Internal Medicine

## 2021-10-30 ENCOUNTER — Other Ambulatory Visit: Payer: Self-pay

## 2021-10-30 VITALS — BP 132/72 | HR 68 | Temp 97.0°F | Ht 69.0 in | Wt 268.0 lb

## 2021-10-30 DIAGNOSIS — N5082 Scrotal pain: Secondary | ICD-10-CM | POA: Insufficient documentation

## 2021-10-30 DIAGNOSIS — D849 Immunodeficiency, unspecified: Secondary | ICD-10-CM | POA: Diagnosis not present

## 2021-10-30 DIAGNOSIS — F39 Unspecified mood [affective] disorder: Secondary | ICD-10-CM | POA: Diagnosis not present

## 2021-10-30 DIAGNOSIS — E1165 Type 2 diabetes mellitus with hyperglycemia: Secondary | ICD-10-CM

## 2021-10-30 DIAGNOSIS — Z0000 Encounter for general adult medical examination without abnormal findings: Secondary | ICD-10-CM

## 2021-10-30 DIAGNOSIS — I255 Ischemic cardiomyopathy: Secondary | ICD-10-CM | POA: Diagnosis not present

## 2021-10-30 DIAGNOSIS — I700 Atherosclerosis of aorta: Secondary | ICD-10-CM | POA: Diagnosis not present

## 2021-10-30 DIAGNOSIS — Z794 Long term (current) use of insulin: Secondary | ICD-10-CM

## 2021-10-30 DIAGNOSIS — I2510 Atherosclerotic heart disease of native coronary artery without angina pectoris: Secondary | ICD-10-CM

## 2021-10-30 DIAGNOSIS — I10 Essential (primary) hypertension: Secondary | ICD-10-CM | POA: Diagnosis not present

## 2021-10-30 DIAGNOSIS — I25110 Atherosclerotic heart disease of native coronary artery with unstable angina pectoris: Secondary | ICD-10-CM | POA: Diagnosis not present

## 2021-10-30 DIAGNOSIS — Z0001 Encounter for general adult medical examination with abnormal findings: Secondary | ICD-10-CM | POA: Insufficient documentation

## 2021-10-30 DIAGNOSIS — I4892 Unspecified atrial flutter: Secondary | ICD-10-CM | POA: Diagnosis not present

## 2021-10-30 DIAGNOSIS — N138 Other obstructive and reflux uropathy: Secondary | ICD-10-CM | POA: Diagnosis not present

## 2021-10-30 DIAGNOSIS — I428 Other cardiomyopathies: Secondary | ICD-10-CM | POA: Diagnosis not present

## 2021-10-30 DIAGNOSIS — I480 Paroxysmal atrial fibrillation: Secondary | ICD-10-CM | POA: Diagnosis not present

## 2021-10-30 DIAGNOSIS — N401 Enlarged prostate with lower urinary tract symptoms: Secondary | ICD-10-CM | POA: Diagnosis not present

## 2021-10-30 DIAGNOSIS — I200 Unstable angina: Secondary | ICD-10-CM | POA: Diagnosis not present

## 2021-10-30 DIAGNOSIS — J811 Chronic pulmonary edema: Secondary | ICD-10-CM | POA: Diagnosis not present

## 2021-10-30 DIAGNOSIS — I482 Chronic atrial fibrillation, unspecified: Secondary | ICD-10-CM | POA: Diagnosis not present

## 2021-10-30 DIAGNOSIS — E1122 Type 2 diabetes mellitus with diabetic chronic kidney disease: Secondary | ICD-10-CM | POA: Diagnosis present

## 2021-10-30 DIAGNOSIS — I130 Hypertensive heart and chronic kidney disease with heart failure and stage 1 through stage 4 chronic kidney disease, or unspecified chronic kidney disease: Secondary | ICD-10-CM | POA: Diagnosis not present

## 2021-10-30 DIAGNOSIS — E876 Hypokalemia: Secondary | ICD-10-CM | POA: Diagnosis not present

## 2021-10-30 DIAGNOSIS — J45998 Other asthma: Secondary | ICD-10-CM | POA: Diagnosis not present

## 2021-10-30 DIAGNOSIS — I959 Hypotension, unspecified: Secondary | ICD-10-CM | POA: Diagnosis not present

## 2021-10-30 DIAGNOSIS — R0789 Other chest pain: Secondary | ICD-10-CM | POA: Diagnosis not present

## 2021-10-30 DIAGNOSIS — I5042 Chronic combined systolic (congestive) and diastolic (congestive) heart failure: Secondary | ICD-10-CM

## 2021-10-30 DIAGNOSIS — N1832 Chronic kidney disease, stage 3b: Secondary | ICD-10-CM

## 2021-10-30 DIAGNOSIS — E66813 Obesity, class 3: Secondary | ICD-10-CM | POA: Insufficient documentation

## 2021-10-30 DIAGNOSIS — R109 Unspecified abdominal pain: Secondary | ICD-10-CM | POA: Diagnosis not present

## 2021-10-30 DIAGNOSIS — I493 Ventricular premature depolarization: Secondary | ICD-10-CM | POA: Diagnosis not present

## 2021-10-30 DIAGNOSIS — E669 Obesity, unspecified: Secondary | ICD-10-CM | POA: Diagnosis not present

## 2021-10-30 DIAGNOSIS — I248 Other forms of acute ischemic heart disease: Secondary | ICD-10-CM | POA: Diagnosis not present

## 2021-10-30 DIAGNOSIS — E785 Hyperlipidemia, unspecified: Secondary | ICD-10-CM | POA: Diagnosis not present

## 2021-10-30 DIAGNOSIS — I861 Scrotal varices: Secondary | ICD-10-CM | POA: Diagnosis not present

## 2021-10-30 DIAGNOSIS — D631 Anemia in chronic kidney disease: Secondary | ICD-10-CM | POA: Diagnosis not present

## 2021-10-30 DIAGNOSIS — Z940 Kidney transplant status: Secondary | ICD-10-CM | POA: Diagnosis not present

## 2021-10-30 DIAGNOSIS — R001 Bradycardia, unspecified: Secondary | ICD-10-CM | POA: Diagnosis not present

## 2021-10-30 DIAGNOSIS — N433 Hydrocele, unspecified: Secondary | ICD-10-CM | POA: Diagnosis not present

## 2021-10-30 DIAGNOSIS — K219 Gastro-esophageal reflux disease without esophagitis: Secondary | ICD-10-CM | POA: Diagnosis present

## 2021-10-30 DIAGNOSIS — F32A Depression, unspecified: Secondary | ICD-10-CM | POA: Diagnosis not present

## 2021-10-30 DIAGNOSIS — D649 Anemia, unspecified: Secondary | ICD-10-CM | POA: Diagnosis not present

## 2021-10-30 DIAGNOSIS — G4733 Obstructive sleep apnea (adult) (pediatric): Secondary | ICD-10-CM | POA: Diagnosis present

## 2021-10-30 DIAGNOSIS — I21A1 Myocardial infarction type 2: Secondary | ICD-10-CM | POA: Diagnosis not present

## 2021-10-30 DIAGNOSIS — Z87891 Personal history of nicotine dependence: Secondary | ICD-10-CM | POA: Diagnosis not present

## 2021-10-30 DIAGNOSIS — R079 Chest pain, unspecified: Secondary | ICD-10-CM | POA: Diagnosis not present

## 2021-10-30 DIAGNOSIS — J45909 Unspecified asthma, uncomplicated: Secondary | ICD-10-CM | POA: Diagnosis not present

## 2021-10-30 DIAGNOSIS — I214 Non-ST elevation (NSTEMI) myocardial infarction: Secondary | ICD-10-CM | POA: Diagnosis not present

## 2021-10-30 DIAGNOSIS — E291 Testicular hypofunction: Secondary | ICD-10-CM | POA: Diagnosis not present

## 2021-10-30 DIAGNOSIS — I2721 Secondary pulmonary arterial hypertension: Secondary | ICD-10-CM | POA: Diagnosis not present

## 2021-10-30 DIAGNOSIS — I272 Pulmonary hypertension, unspecified: Secondary | ICD-10-CM | POA: Diagnosis not present

## 2021-10-30 DIAGNOSIS — R072 Precordial pain: Secondary | ICD-10-CM | POA: Diagnosis not present

## 2021-10-30 DIAGNOSIS — I132 Hypertensive heart and chronic kidney disease with heart failure and with stage 5 chronic kidney disease, or end stage renal disease: Secondary | ICD-10-CM | POA: Diagnosis not present

## 2021-10-30 LAB — HM DIABETES FOOT EXAM

## 2021-10-30 MED ORDER — HYDROCORTISONE 2.5 % EX CREA
TOPICAL_CREAM | Freq: Three times a day (TID) | CUTANEOUS | 3 refills | Status: DC | PRN
  Filled 2021-10-30: qty 30, 7d supply, fill #0

## 2021-10-30 NOTE — Progress Notes (Signed)
Subjective:    Patient ID: Carl Harlin Mazzoni., male    DOB: [birth year], 61 y.o.   MRN: [id number]  HPI Here for Medicare wellness visit and follow up of chronic health conditions Reviewed advanced directives Reviewed other doctors----Ms Vaillancourt (Dr SToioff)--urology, new dentist--Dr Rosana Hoes, [facility] transplant service, [facility], Dr Solum--endocrine, Dr Arida--cardiologist, Dr Patel--rheumatologist, Dr Mariah Milling apnea, Dr Heide Scales, Dr Lenis Noon, Dr Ronnald Ramp --nephrology One recent hospitalization--for the high potassium No set exercise--tries to walk some. Plans to go back to gym Vision is okay Hearing is okay--with aides No alcohol Not tobacco No falls Mild depressed feelings---"a lot going on" (like wife with new lumbar radiculopathy. Not anhedonic (enjoys fishing with grandson, etc) Independent with instrumental ADLs Some ongoing issues with memory and attention---done with Dr Melvyn Novas (neuropsychiatrist)  Last potassium back to normal GFR stable at 29 Continues with [facility] transplant program  Having trouble voiding--very slow and takes a while Then dribbling after Going to urologist Already on tamsulosin  Diabetes control is pretty good Last A1c 6.7% (last week at [facility]) Has some hypoglycemic reactions---gets alarm from [place] (will have juice or nabs) Satisfied with gabapentin for neuropathy  Edema is mild only Same diuretics Gets some SOB---walking up from garden. Not usual Gets sense of chest tightness---upper left chest. Even just sitting Rare sense of racing heart--brief Regular dizziness upon standing--has to hold onto something  Gout is quiet  BMI 39  Current Outpatient Medications on File Prior to Visit  Medication Sig Dispense Refill   albuterol (PROVENTIL) (2.5 MG/3ML) 0.083% nebulizer solution Take 3 mLs (2.5 mg total) by nebulization every 6 (six) hours as needed for wheezing or shortness of breath. 150 mL 0   albuterol (VENTOLIN HFA) 108 (90  Base) MCG/ACT inhaler Inhale 2 puffs into the lungs every 6 (six) hours as needed for wheezing or shortness of breath. 18 g 5   allopurinol (ZYLOPRIM) 100 MG tablet Take 100 mg by mouth daily.     amoxicillin (AMOXIL) 500 MG capsule Take 4 capsules (2,000 mg total) by mouth as needed (Take 1 hr before dental appointment.). 4 capsule 5   apixaban (ELIQUIS) 5 MG TABS tablet TAKE 1 TABLET BY MOUTH TWICE DAILY 180 tablet 1   beclomethasone (QVAR) 80 MCG/ACT inhaler Inhale 2 puffs into the lungs 2 (two) times daily. 10.6 g 5   buPROPion (WELLBUTRIN SR) 150 MG 12 hr tablet TAKE 1 TABLET BY MOUTH TWICE DAILY 180 tablet 3   cholecalciferol (VITAMIN D3) 25 MCG (1000 UNIT) tablet Take 1,000 Units by mouth at bedtime.     colchicine 0.6 MG tablet Take 1 tablet (0.6 mg total) by mouth every other day 30 tablet 1   Continuous Blood Gluc Sensor (FREESTYLE LIBRE 3 SENSOR) MISC Use one every 2 weeks. 6 each 3   dapagliflozin propanediol (FARXIGA) 10 MG TABS tablet Take 1 tablet (10 mg total) by mouth daily before breakfast. 30 tablet 11   Febuxostat 80 MG TABS Take 1 tablet (80 mg total) by mouth once daily for 180 days 30 tablet 5   fluticasone (FLONASE) 50 MCG/ACT nasal spray PLACE 2 SPRAYS INTO BOTH NOSTRILS DAILY. 16 g 11   gabapentin (NEURONTIN) 300 MG capsule TAKE 1 CAPSULE BY MOUTH TWICE DAILY 180 capsule 0   hydrALAZINE (APRESOLINE) 25 MG tablet Take 1/2 tablet (12.5 mg total) by mouth every 8 (eight) hours. 45 tablet 0   insulin glargine-yfgn (SEMGLEE) 100 UNIT/ML Pen Inject 40 Units subcutaneously once daily (Patient taking differently: Inject 40 Units  into the skin at bedtime.) 45 mL 3   insulin lispro (HUMALOG KWIKPEN) 100 UNIT/ML KwikPen INJECT UP TO 50 UNITS UNDER THE SKIN DAILY AS DIRECTED 45 mL 3   Insulin Pen Needle (UNIFINE PENTIPS) 31G X 5 MM MISC Use 4 times daily as directed 400 each 3   iron polysaccharides (FERREX 150) 150 MG capsule TAKE 1 CAPSULE BY MOUTH 2 TIMES DAILY. 180 capsule 1    metolazone (ZAROXOLYN) 2.5 MG tablet Take 1 tablet by mouth 2 days per week 10 tablet 6   Multiple Vitamin (MULTIVITAMIN WITH MINERALS) TABS tablet Take 1 tablet by mouth daily.     mycophenolate (MYFORTIC) 180 MG EC tablet Take 360 mg by mouth 2 (two) times daily.     omeprazole (PRILOSEC) 20 MG capsule TAKE 1 CAPSULE BY MOUTH DAILY. (Patient taking differently: Take 20 mg by mouth at bedtime.) 90 capsule 3   Potassium Chloride CR (MICRO-K) 8 MEQ CPCR capsule CR Take 10 capsules (80 mEq total) by mouth 2 (two) times daily. 1 capsule 0   predniSONE (DELTASONE) 5 MG tablet Take 1 tablet (5 mg total) by mouth daily. 90 tablet 3   rosuvastatin (CRESTOR) 10 MG tablet Take 1 tablet (10 mg total) by mouth daily. 90 tablet 3   sulfamethoxazole-trimethoprim (BACTRIM) 400-80 MG tablet TAKE 1 TABLET BY MOUTH EVERY MONDAY, WEDNESDAY, FRIDAY. 36 tablet 3   tacrolimus (PROGRAF) 1 MG capsule Take 2 capsules (2 mg total) by mouth 2 times daily. 120 capsule 5   tadalafil, PAH, (ADCIRCA) 20 MG tablet TAKE 1 TABLET (20 MG TOTAL) BY MOUTH EVERY OTHER DAY. 60 tablet 1   tamsulosin (FLOMAX) 0.4 MG CAPS capsule TAKE 1 CAPSULE BY MOUTH DAILY. 90 capsule 3   torsemide (DEMADEX) 100 MG tablet Take 1 tablet (100 mg total) by mouth 2 (two) times daily. 180 tablet 3   zinc sulfate 220 (50 Zn) MG capsule Take 220 mg by mouth daily.     montelukast (SINGULAIR) 10 MG tablet TAKE 1 TABLET BY MOUTH AT BEDTIME. 90 tablet 3   No current facility-administered medications on file prior to visit.    Allergies  Allergen Reactions   Contrast Media [Iodinated Contrast Media] Other (See Comments)    Kidney transplant   Iodine Other (See Comments)    Other reaction(s): Other (See Comments) Kidney transplant   Ibuprofen Other (See Comments)    Due to kidney transplant Due to kidney transplant Due to kidney transplant    Past Medical History:  Diagnosis Date   Asthma, persistent controlled 02/25/2013   Attention or  concentration deficit 04/26/2021   BPH with obstruction/lower urinary tract symptoms 09/25/2014   CAD (coronary artery disease) 2005   a.  Status post four-vessel CABG in 2005; b. 03/2018 MV: Small, fixed inferoapical and apical septal defect. No ischemia. EF 47% (60-65% by 05/2018 Echo).   Cellulitis and abscess of left leg 07/22/2020   Chronic atrial fibrillation    a. CHADS2VASc 4 (CHF, HTN, DM, vascular disease)--> Eliquis; b. 09/2018 Zio: Chronic Afib.   Chronic combined systolic and diastolic CHF (congestive heart failure)    a.  7/18 Echo: EF 45-50%; b. 05/2018 Echo: EF 60-65%, RVSP 74.8; c. 12/2018 Echo: EF 50-55%. Sev dil LA; d. 04/2019 Echo: EF 45-50%, glob HK. Mod reduced RV fxn. RVSP 53.73mHg.   Chronic venous stasis dermatitis of both lower extremities    Cytomegaloviral disease 2017   Diverticulosis of colon 04/22/2013   Essential hypertension 12/17/2010   GERD (gastroesophageal  reflux disease)    Gout attack 03/30/2018   History of renal transplant 03/01/2013   Hyperlipidemia    low since renal failure   Hypogonadism in male 09/25/2014   Major depressive disorder 10/04/2013   Obesity hypoventilation syndrome 03/31/2012   Obstructive sleep apnea 09/29/2012   NPSG [place] 03/15/12- AHI 57.9/ hr, desaturation to 78.8%, body weight 310 lbs   PAH (pulmonary artery hypertension)    a. 05/2018 Echo: RVSP 74.45mHg; b. 12/2018 RV not well visualized; c. 04/2019 RHC: RA 19, RV 80/20, PA 78/31 (51), PCWP 25, CO/CI 7.69/3.26. PVR 3.25 WU-->Sev PAH, likely primarily PV HTN.   Synovitis of finger 06/11/2019   Trigger finger, unspecified little finger 12/23/2018   Type 2 diabetes mellitus with hyperglycemia, with long-term current use of insulin 09/09/2014   Ulcer 05/2016   Left shin   Wears glasses     Past Surgical History:  Procedure Laterality Date   [place]IATRIC [place] 05/03/[place]   CATARACT EXTRACTION W/PHACO Right  10/29/2017   Procedure: CATARACT EXTRACTION PHACO AND INTRAOCULAR LENS PLACEMENT ([place]  RIGHT DIABETIC;  Surgeon: BLeandrew Koyanagi MD;  [place]  Service: Ophthalmology;  Laterality: Right;  Diabetic - insulin   CATARACT EXTRACTION W/PHACO Left 11/18/2017   Procedure: CATARACT EXTRACTION PHACO AND INTRAOCULAR LENS PLACEMENT ([place] LEFT IVA/TOPICAL;  Surgeon: BLeandrew Koyanagi MD;  [place] [facility]  Service: Ophthalmology;  Laterality: Left;  DIABETES - insulin sleep apnea   COLONOSCOPY WITH PROPOFOL N/A 04/22/2013   Procedure: COLONOSCOPY WITH PROPOFOL;  Surgeon: DMilus Banister MD;  [facility];  Service: Endoscopy;  Laterality: N/A;   CORONARY ARTERY BYPASS GRAFT  2005   X 4   DG ANGIO AV SHUNT*L*     right and left upper arms   FASCIOTOMY  03/03/2012   Procedure: FASCIOTOMY;  Surgeon: GWynonia Sours MD;  [place] [facility]  Service: Orthopedics;  Laterality: Right;  FASCIOTOMY RIGHT SMALL FINGER   FASCIOTOMY Left 08/17/2013   Procedure: FASCIOTOMY LEFT RING;  Surgeon: GWynonia Sours MD;  [place]  Service: Orthopedics;  Laterality: Left;   INCISION AND DRAINAGE ABSCESS Left 10/15/2015   Procedure: INCISION AND DRAINAGE ABSCESS;  Surgeon: DJules Husbands MD;  Location: [facility];  Service: General;  Laterality: Left;   KIDNEY [facility]PL[facility]  [facility]/13/2010   [facility]   Procedure: RIGHT HEART CATH;  Surgeon: AWellington Hampshire MD;  [place] [facility];  Service: Cardiovascular;  Laterality: N/A;   RIGHT HEART CATH N/A 05/03/2019   Procedure: RIGHT HEART CATH;  Surgeon: MLarey Dresser MD;  [place] [facility];  Service: Cardiovascular;  Laterality: N/A;   TYMPANIC MEMBRANE REPAIR  1/12   left   VASECTOMY      Family History  Problem Relation Age of Onset   Heart disease Father    Kidney failure Father    Kidney disease Father    Diabetes  Maternal Grandmother    Breast cancer Maternal Grandmother    Valvular heart disease Mother    Liver cancer Paternal Uncle    Liver cancer Paternal Grandmother    Prostate cancer Neg Hx     Social History   Socioeconomic History   Marital status: Married    Spouse name: Not on file   Number of children: 2   Years  of education: 14   Highest education level: Associate degree: academic program  Occupational History   Occupation: Warden/ranger: unemployed    Comment: disabled due to kidney failure  Tobacco Use   Smoking status: Former    Types: Cigars    Quit date: 09/02/1994    Years since quitting: 27.1    Passive exposure: Never   Smokeless tobacco: Never   Tobacco comments:    Occasional, rare cigars  Vaping Use   Vaping Use: Never used  Substance and Sexual Activity   Alcohol use: Yes    Alcohol/week: 0.0 - 1.0 standard drinks of alcohol    Comment: occasional - 12-pack lasts a month   Drug use: No   Sexual activity: Not on file  Other Topics Concern   Not on file  Social History Narrative   Has living will   Wife is health care POA   Would accept resuscitation attempts   No tube feedings if cognitively unaware   Social Determinants of Health   Financial Resource Strain: Not on file  Food Insecurity: Not on file  Transportation Needs: Not on file  Physical Activity: Not on file  Stress: Not on file  Social Connections: Not on file  Intimate Partner Violence: Not on file   Review of Systems Appetite is okay Weight is fairly stable Sleeping 4-6 hours---uses CPAP every night. Some daytime somnolence--nap after lunch will help Wears seat belt Teeth okay--but just cracked one--overdue with dentist No suspicious skin lesions Some trouble with hemorrhoids--they pop out and bleed at times (uses witch hazel and OTC cortisone cream). Will give stronger cream. General surgery if worsens Has soft stools mostly No sig back or joint pains  now No heartburn or dysphagia    Objective:   Physical Exam Constitutional:      Appearance: Normal appearance.  HENT:     Mouth/Throat:     Comments: No lesions Eyes:     Conjunctiva/sclera: Conjunctivae normal.     Pupils: Pupils are equal, round, and reactive to light.  Cardiovascular:     Rate and Rhythm: Normal rate and regular rhythm.     Pulses: Normal pulses.     Heart sounds: No murmur heard.    No gallop.  Pulmonary:     Effort: Pulmonary effort is normal.     Breath sounds: Normal breath sounds. No wheezing or rales.  Abdominal:     Palpations: Abdomen is soft.     Tenderness: There is no abdominal tenderness.  Musculoskeletal:     Cervical back: Neck supple.     Comments: 1+ edema in calves/feet  Lymphadenopathy:     Cervical: No cervical adenopathy.  Skin:    Findings: No rash.     Comments: Stasis changes in calves. Small ulcers with black stable eschar bilaterally No foot lesions  Neurological:     General: No focal deficit present.     Mental Status: He is alert and oriented to person, place, and time.     Comments: Mini-cog normal Hyper-aesthesia in feet  Psychiatric:        Mood and Affect: Mood normal.        Behavior: Behavior normal.            Assessment & Plan:

## 2021-10-30 NOTE — Assessment & Plan Note (Signed)
I have personally reviewed the Medicare Annual Wellness questionnaire and have noted 1. The patient's medical and social history 2. Their use of alcohol, tobacco or illicit drugs 3. Their current medications and supplements 4. The patient's functional ability including ADL's, fall risks, home safety risks and hearing or visual             impairment. 5. Diet and physical activities 6. Evidence for depression or mood disorders  The patients weight, height, BMI and visual acuity have been recorded in the chart I have made referrals, counseling and provided education to the patient based review of the above and I have provided the pt with a written personalized care plan for preventive services.  I have provided you with a copy of your personalized plan for preventive services. Please take the time to review along with your updated medication list.  Colonoscopy due 2027 Will discuss PSA with urologist--probably check Needs to get back to the gym Updated COVID and flu vaccines this fall

## 2021-10-30 NOTE — Assessment & Plan Note (Signed)
Transplant team manages

## 2021-10-30 NOTE — Assessment & Plan Note (Signed)
BMI 39 with multiple morbid conditions He plans to get back to the gym and is watching his eating

## 2021-10-30 NOTE — Assessment & Plan Note (Signed)
Mostly reactive depression at times On bupropion 150 bid

## 2021-10-30 NOTE — Assessment & Plan Note (Signed)
Last A1c 6.7% farxiga 10, glargine 40, lispro before meals prn

## 2021-10-30 NOTE — Assessment & Plan Note (Signed)
Renal transplant On prednisone 5, mycophenalate 360 bid, tacrolimus 2 mg bid

## 2021-10-30 NOTE — Assessment & Plan Note (Signed)
Compensated with the diuretic regimen---torsemide 100 bid, ?spironolactone 25 (not sure), metolazone 2.5 twice a week Hydralazine 12.5 tid

## 2021-10-30 NOTE — Assessment & Plan Note (Signed)
Occasional spells Is on eliquis 5 bid

## 2021-10-30 NOTE — Progress Notes (Signed)
Hearing Screening - Comments:: Has hearing aids. Wearing them today. Vision Screening - Comments:: September 2022

## 2021-10-31 ENCOUNTER — Encounter (HOSPITAL_COMMUNITY): Payer: Self-pay

## 2021-10-31 ENCOUNTER — Other Ambulatory Visit (HOSPITAL_COMMUNITY): Payer: Self-pay

## 2021-10-31 NOTE — Progress Notes (Signed)
Today I was contacted by [name] stating he was very dizzy and afraid he was going to pass out.  He states blood pressure was 70/60.  Was with a patient and about 30 minutes from his home.  His wife was with him, advised him to contact 911 and I would check on him in a bit.  Advised him to sit with legs up and attempt to drink some water, he advised he thought could be dehydrated.  When left my patient home check our cad and no unit was enroute to him.  Contacted him back and he states was feeling a little better and did not call 911.  Went to see him, he had drank 1 17 oz of water and 2 12 oz gatorade before I arrived.  Blood pressure was in normal limits for me, check orthostatics.  He states dizziness was improving and was able to stand while I was there where prior to me arriving he was unable.  He states took a metolazone yesterday, urinated a lot and ran a lot of errands.  He did not drink much fluids yesterday.  He was outside spreading insecticide this morning when dizziness started and worsened quickly.  Very hot and humid today.  He appears to had been dehydrated.  Repeated vitals and he was up and about ambulating prior to me leaving.  He states feel much better. He denies any chest pain or increased in shortness of breath.  Advised him to continue to drink enough today and rest.  He did say he has been losing some weight but he lost 2 lbs overnight and did not think much about it.  I advise him if blood pressure drops and dizziness came back to contact 911 and get evaluated at ED.  Wife is home today.  Will continue to visit for heart failure, diet and medication management.   [place] [phone]

## 2021-11-01 ENCOUNTER — Other Ambulatory Visit: Payer: 59

## 2021-11-01 ENCOUNTER — Encounter: Payer: Medicare Other | Admitting: Urology

## 2021-11-01 DIAGNOSIS — E291 Testicular hypofunction: Secondary | ICD-10-CM | POA: Diagnosis not present

## 2021-11-01 DIAGNOSIS — N138 Other obstructive and reflux uropathy: Secondary | ICD-10-CM

## 2021-11-01 DIAGNOSIS — N401 Enlarged prostate with lower urinary tract symptoms: Secondary | ICD-10-CM | POA: Diagnosis not present

## 2021-11-02 ENCOUNTER — Other Ambulatory Visit: Payer: Self-pay

## 2021-11-02 ENCOUNTER — Emergency Department: Payer: 59

## 2021-11-02 ENCOUNTER — Inpatient Hospital Stay
Admission: EM | Admit: 2021-11-02 | Discharge: 2021-11-04 | DRG: 303 | Disposition: A | Discharge status: Home / Self Care | Payer: 59 | Attending: Internal Medicine | Admitting: Internal Medicine

## 2021-11-02 ENCOUNTER — Inpatient Hospital Stay: Payer: 59

## 2021-11-02 DIAGNOSIS — I21A1 Myocardial infarction type 2: Secondary | ICD-10-CM | POA: Diagnosis not present

## 2021-11-02 DIAGNOSIS — I4892 Unspecified atrial flutter: Secondary | ICD-10-CM | POA: Diagnosis present

## 2021-11-02 DIAGNOSIS — Z800 Family history of malignant neoplasm of digestive organs: Secondary | ICD-10-CM

## 2021-11-02 DIAGNOSIS — F32A Depression, unspecified: Secondary | ICD-10-CM | POA: Diagnosis present

## 2021-11-02 DIAGNOSIS — Z951 Presence of aortocoronary bypass graft: Secondary | ICD-10-CM

## 2021-11-02 DIAGNOSIS — I132 Hypertensive heart and chronic kidney disease with heart failure and with stage 5 chronic kidney disease, or end stage renal disease: Secondary | ICD-10-CM | POA: Diagnosis present

## 2021-11-02 DIAGNOSIS — N1832 Chronic kidney disease, stage 3b: Secondary | ICD-10-CM | POA: Diagnosis present

## 2021-11-02 DIAGNOSIS — J45909 Unspecified asthma, uncomplicated: Secondary | ICD-10-CM | POA: Diagnosis present

## 2021-11-02 DIAGNOSIS — R109 Unspecified abdominal pain: Secondary | ICD-10-CM | POA: Diagnosis present

## 2021-11-02 DIAGNOSIS — I25110 Atherosclerotic heart disease of native coronary artery with unstable angina pectoris: Principal | ICD-10-CM | POA: Diagnosis present

## 2021-11-02 DIAGNOSIS — Z560 Unemployment, unspecified: Secondary | ICD-10-CM

## 2021-11-02 DIAGNOSIS — I200 Unstable angina: Principal | ICD-10-CM

## 2021-11-02 DIAGNOSIS — J811 Chronic pulmonary edema: Secondary | ICD-10-CM | POA: Diagnosis not present

## 2021-11-02 DIAGNOSIS — E66813 Obesity, class 3: Secondary | ICD-10-CM | POA: Diagnosis present

## 2021-11-02 DIAGNOSIS — K219 Gastro-esophageal reflux disease without esophagitis: Secondary | ICD-10-CM | POA: Diagnosis present

## 2021-11-02 DIAGNOSIS — I5042 Chronic combined systolic (congestive) and diastolic (congestive) heart failure: Secondary | ICD-10-CM | POA: Diagnosis present

## 2021-11-02 DIAGNOSIS — I428 Other cardiomyopathies: Secondary | ICD-10-CM | POA: Diagnosis not present

## 2021-11-02 DIAGNOSIS — D509 Iron deficiency anemia, unspecified: Secondary | ICD-10-CM | POA: Diagnosis present

## 2021-11-02 DIAGNOSIS — Z9884 Bariatric surgery status: Secondary | ICD-10-CM

## 2021-11-02 DIAGNOSIS — G4733 Obstructive sleep apnea (adult) (pediatric): Secondary | ICD-10-CM | POA: Diagnosis present

## 2021-11-02 DIAGNOSIS — I272 Pulmonary hypertension, unspecified: Secondary | ICD-10-CM | POA: Diagnosis not present

## 2021-11-02 DIAGNOSIS — Z87891 Personal history of nicotine dependence: Secondary | ICD-10-CM

## 2021-11-02 DIAGNOSIS — R072 Precordial pain: Secondary | ICD-10-CM | POA: Diagnosis not present

## 2021-11-02 DIAGNOSIS — E669 Obesity, unspecified: Secondary | ICD-10-CM | POA: Diagnosis present

## 2021-11-02 DIAGNOSIS — I2510 Atherosclerotic heart disease of native coronary artery without angina pectoris: Secondary | ICD-10-CM | POA: Diagnosis not present

## 2021-11-02 DIAGNOSIS — Z8249 Family history of ischemic heart disease and other diseases of the circulatory system: Secondary | ICD-10-CM

## 2021-11-02 DIAGNOSIS — I482 Chronic atrial fibrillation, unspecified: Secondary | ICD-10-CM | POA: Diagnosis present

## 2021-11-02 DIAGNOSIS — Z79899 Other long term (current) drug therapy: Secondary | ICD-10-CM

## 2021-11-02 DIAGNOSIS — I700 Atherosclerosis of aorta: Secondary | ICD-10-CM | POA: Diagnosis present

## 2021-11-02 DIAGNOSIS — Z7952 Long term (current) use of systemic steroids: Secondary | ICD-10-CM

## 2021-11-02 DIAGNOSIS — D649 Anemia, unspecified: Secondary | ICD-10-CM | POA: Diagnosis present

## 2021-11-02 DIAGNOSIS — I130 Hypertensive heart and chronic kidney disease with heart failure and stage 1 through stage 4 chronic kidney disease, or unspecified chronic kidney disease: Secondary | ICD-10-CM | POA: Diagnosis present

## 2021-11-02 DIAGNOSIS — Z794 Long term (current) use of insulin: Secondary | ICD-10-CM

## 2021-11-02 DIAGNOSIS — D631 Anemia in chronic kidney disease: Secondary | ICD-10-CM | POA: Diagnosis present

## 2021-11-02 DIAGNOSIS — Z6837 Body mass index (BMI) 37.0-37.9, adult: Secondary | ICD-10-CM

## 2021-11-02 DIAGNOSIS — I248 Other forms of acute ischemic heart disease: Secondary | ICD-10-CM | POA: Diagnosis present

## 2021-11-02 DIAGNOSIS — E1129 Type 2 diabetes mellitus with other diabetic kidney complication: Secondary | ICD-10-CM | POA: Diagnosis present

## 2021-11-02 DIAGNOSIS — R079 Chest pain, unspecified: Secondary | ICD-10-CM | POA: Insufficient documentation

## 2021-11-02 DIAGNOSIS — I5022 Chronic systolic (congestive) heart failure: Secondary | ICD-10-CM | POA: Diagnosis present

## 2021-11-02 DIAGNOSIS — Z940 Kidney transplant status: Secondary | ICD-10-CM | POA: Diagnosis not present

## 2021-11-02 DIAGNOSIS — I2721 Secondary pulmonary arterial hypertension: Secondary | ICD-10-CM | POA: Diagnosis present

## 2021-11-02 DIAGNOSIS — Z803 Family history of malignant neoplasm of breast: Secondary | ICD-10-CM

## 2021-11-02 DIAGNOSIS — Z833 Family history of diabetes mellitus: Secondary | ICD-10-CM

## 2021-11-02 DIAGNOSIS — I214 Non-ST elevation (NSTEMI) myocardial infarction: Secondary | ICD-10-CM | POA: Diagnosis not present

## 2021-11-02 DIAGNOSIS — E876 Hypokalemia: Secondary | ICD-10-CM | POA: Diagnosis present

## 2021-11-02 DIAGNOSIS — K859 Acute pancreatitis without necrosis or infection, unspecified: Secondary | ICD-10-CM | POA: Diagnosis present

## 2021-11-02 DIAGNOSIS — E785 Hyperlipidemia, unspecified: Secondary | ICD-10-CM | POA: Diagnosis present

## 2021-11-02 DIAGNOSIS — I255 Ischemic cardiomyopathy: Secondary | ICD-10-CM | POA: Diagnosis present

## 2021-11-02 DIAGNOSIS — I872 Venous insufficiency (chronic) (peripheral): Secondary | ICD-10-CM | POA: Diagnosis present

## 2021-11-02 DIAGNOSIS — E119 Type 2 diabetes mellitus without complications: Secondary | ICD-10-CM | POA: Diagnosis present

## 2021-11-02 DIAGNOSIS — N401 Enlarged prostate with lower urinary tract symptoms: Secondary | ICD-10-CM | POA: Diagnosis present

## 2021-11-02 DIAGNOSIS — Z7901 Long term (current) use of anticoagulants: Secondary | ICD-10-CM

## 2021-11-02 DIAGNOSIS — Z9049 Acquired absence of other specified parts of digestive tract: Secondary | ICD-10-CM

## 2021-11-02 DIAGNOSIS — E1122 Type 2 diabetes mellitus with diabetic chronic kidney disease: Secondary | ICD-10-CM | POA: Diagnosis present

## 2021-11-02 DIAGNOSIS — I493 Ventricular premature depolarization: Secondary | ICD-10-CM | POA: Diagnosis present

## 2021-11-02 DIAGNOSIS — E1169 Type 2 diabetes mellitus with other specified complication: Secondary | ICD-10-CM | POA: Diagnosis present

## 2021-11-02 DIAGNOSIS — J45998 Other asthma: Secondary | ICD-10-CM | POA: Diagnosis present

## 2021-11-02 DIAGNOSIS — I10 Essential (primary) hypertension: Secondary | ICD-10-CM | POA: Diagnosis present

## 2021-11-02 LAB — CBC
HCT: 28.3 % — ABNORMAL LOW (ref 39.0–52.0)
HCT: 28.1 % — ABNORMAL LOW (ref 39.0–52.0)
Hemoglobin: 8.4 g/dL — ABNORMAL LOW (ref 13.0–17.0)
Hemoglobin: 8.6 g/dL — ABNORMAL LOW (ref 13.0–17.0)
MCH: 24.7 pg — ABNORMAL LOW (ref 26.0–34.0)
MCH: 25.1 pg — ABNORMAL LOW (ref 26.0–34.0)
MCHC: 29.7 g/dL — ABNORMAL LOW (ref 30.0–36.0)
MCHC: 30.6 g/dL (ref 30.0–36.0)
MCV: 83.2 fL (ref 80.0–100.0)
MCV: 82.2 fL (ref 80.0–100.0)
Platelets: 141 10*3/uL — ABNORMAL LOW (ref 150–400)
Platelets: 109 10*3/uL — ABNORMAL LOW (ref 150–400)
RBC: 3.4 MIL/uL — ABNORMAL LOW (ref 4.22–5.81)
RBC: 3.42 MIL/uL — ABNORMAL LOW (ref 4.22–5.81)
RDW: 15.3 % (ref 11.5–15.5)
RDW: 15.3 % (ref 11.5–15.5)
WBC: 5.4 10*3/uL (ref 4.0–10.5)
WBC: 4 10*3/uL (ref 4.0–10.5)
nRBC: 0 % (ref 0.0–0.2)
nRBC: 0 % (ref 0.0–0.2)

## 2021-11-02 LAB — GLUCOSE, CAPILLARY
Glucose-Capillary: 131 mg/dL — ABNORMAL HIGH (ref 70–99)
Glucose-Capillary: 107 mg/dL — ABNORMAL HIGH (ref 70–99)

## 2021-11-02 LAB — COMPREHENSIVE METABOLIC PANEL
ALT: 9 U/L (ref 0–44)
AST: 18 U/L (ref 15–41)
Albumin: 3.4 g/dL — ABNORMAL LOW (ref 3.5–5.0)
Alkaline Phosphatase: 121 U/L (ref 38–126)
Anion gap: 10 (ref 5–15)
BUN: 59 mg/dL — ABNORMAL HIGH (ref 8–23)
CO2: 31 mmol/L (ref 22–32)
Calcium: 8.6 mg/dL — ABNORMAL LOW (ref 8.9–10.3)
Chloride: 100 mmol/L (ref 98–111)
Creatinine, Ser: 2.4 mg/dL — ABNORMAL HIGH (ref 0.61–1.24)
GFR, Estimated: 30 mL/min — ABNORMAL LOW (ref >60)
Glucose, Bld: 115 mg/dL — ABNORMAL HIGH (ref 70–99)
Potassium: 3.4 mmol/L — ABNORMAL LOW (ref 3.5–5.1)
Sodium: 141 mmol/L (ref 135–145)
Total Bilirubin: 0.9 mg/dL (ref 0.3–1.2)
Total Protein: 6.8 g/dL (ref 6.5–8.1)

## 2021-11-02 LAB — URINALYSIS, COMPLETE (UACMP) WITH MICROSCOPIC
Bacteria, UA: NONE SEEN
Bilirubin Urine: NEGATIVE
Glucose, UA: 150 mg/dL — AB
Hgb urine dipstick: NEGATIVE
Ketones, ur: NEGATIVE mg/dL
Leukocytes,Ua: NEGATIVE
Nitrite: NEGATIVE
Protein, ur: NEGATIVE mg/dL
Specific Gravity, Urine: 1.01 (ref 1.005–1.030)
Squamous Epithelial / HPF: NONE SEEN (ref 0–5)
pH: 5 (ref 5.0–8.0)

## 2021-11-02 LAB — PROTIME-INR
INR: 2.2 — ABNORMAL HIGH (ref 0.8–1.2)
Prothrombin Time: 23.9 seconds — ABNORMAL HIGH (ref 11.4–15.2)

## 2021-11-02 LAB — LIPID PANEL
Cholesterol: 68 mg/dL (ref 0–200)
HDL: 31 mg/dL — ABNORMAL LOW (ref >40)
LDL Cholesterol: 22 mg/dL (ref 0–99)
Total CHOL/HDL Ratio: 2.2 RATIO
Triglycerides: 74 mg/dL (ref <150)
VLDL: 15 mg/dL (ref 0–40)

## 2021-11-02 LAB — HEMOGLOBIN A1C
Hgb A1c MFr Bld: 6.1 % — ABNORMAL HIGH (ref 4.8–5.6)
Mean Plasma Glucose: 128.37 mg/dL

## 2021-11-02 LAB — TROPONIN I (HIGH SENSITIVITY)
Troponin I (High Sensitivity): 29 ng/L — ABNORMAL HIGH (ref <18)
Troponin I (High Sensitivity): 30 ng/L — ABNORMAL HIGH (ref <18)
Troponin I (High Sensitivity): 29 ng/L — ABNORMAL HIGH (ref <18)

## 2021-11-02 LAB — RETICULOCYTES
Immature Retic Fract: 23.5 % — ABNORMAL HIGH (ref 2.3–15.9)
RBC.: 3.45 MIL/uL — ABNORMAL LOW (ref 4.22–5.81)
Retic Count, Absolute: 73.5 10*3/uL (ref 19.0–186.0)
Retic Ct Pct: 2.1 % (ref 0.4–3.1)

## 2021-11-02 LAB — TESTOSTERONE: Testosterone: 202 ng/dL — ABNORMAL LOW (ref 264–916)

## 2021-11-02 LAB — IRON AND TIBC
Iron: 23 ug/dL — ABNORMAL LOW (ref 45–182)
Saturation Ratios: 6 % — ABNORMAL LOW (ref 17.9–39.5)
TIBC: 367 ug/dL (ref 250–450)
UIBC: 344 ug/dL

## 2021-11-02 LAB — MAGNESIUM: Magnesium: 2.1 mg/dL (ref 1.7–2.4)

## 2021-11-02 LAB — FERRITIN: Ferritin: 20 ng/mL — ABNORMAL LOW (ref 24–336)

## 2021-11-02 LAB — BRAIN NATRIURETIC PEPTIDE: B Natriuretic Peptide: 413 pg/mL — ABNORMAL HIGH (ref 0.0–100.0)

## 2021-11-02 LAB — PSA: Prostate Specific Ag, Serum: 1.4 ng/mL (ref 0.0–4.0)

## 2021-11-02 LAB — APTT: aPTT: 128 seconds — ABNORMAL HIGH (ref 24–36)

## 2021-11-02 LAB — HEPARIN LEVEL (UNFRACTIONATED): Heparin Unfractionated: >1.1 IU/mL — ABNORMAL HIGH (ref 0.30–0.70)

## 2021-11-02 LAB — LIPASE, BLOOD: Lipase: 27 U/L (ref 11–51)

## 2021-11-02 LAB — FOLATE: Folate: 10.9 ng/mL (ref >5.9)

## 2021-11-02 MED ORDER — TAMSULOSIN HCL 0.4 MG PO CAPS
0.4000 mg | ORAL_CAPSULE | Freq: Every day | ORAL | Status: DC
  Administered 2021-11-03 – 2021-11-04 (×2): 0.4 mg via ORAL
  Filled 2021-11-02 (×2): qty 1

## 2021-11-02 MED ORDER — BUPROPION HCL ER (SR) 150 MG PO TB12
150.0000 mg | ORAL_TABLET | Freq: Two times a day (BID) | ORAL | Status: DC
  Administered 2021-11-02 – 2021-11-04 (×4): 150 mg via ORAL
  Filled 2021-11-02 (×4): qty 1

## 2021-11-02 MED ORDER — PREDNISONE 10 MG PO TABS
15.0000 mg | ORAL_TABLET | Freq: Every day | ORAL | Status: DC
  Administered 2021-11-03 – 2021-11-04 (×2): 15 mg via ORAL
  Filled 2021-11-02 (×2): qty 2

## 2021-11-02 MED ORDER — FEBUXOSTAT 40 MG PO TABS
80.0000 mg | ORAL_TABLET | Freq: Every day | ORAL | Status: DC
  Administered 2021-11-02 – 2021-11-03 (×2): 80 mg via ORAL
  Filled 2021-11-02 (×3): qty 2

## 2021-11-02 MED ORDER — HYDRALAZINE HCL 25 MG PO TABS
12.5000 mg | ORAL_TABLET | Freq: Three times a day (TID) | ORAL | Status: DC
  Administered 2021-11-02 – 2021-11-04 (×5): 12.5 mg via ORAL
  Filled 2021-11-02 (×5): qty 1

## 2021-11-02 MED ORDER — ADULT MULTIVITAMIN W/MINERALS CH
1.0000 | ORAL_TABLET | Freq: Every day | ORAL | Status: DC
  Administered 2021-11-03 – 2021-11-04 (×2): 1 via ORAL
  Filled 2021-11-02 (×2): qty 1

## 2021-11-02 MED ORDER — MYCOPHENOLATE SODIUM 180 MG PO TBEC
360.0000 mg | DELAYED_RELEASE_TABLET | Freq: Two times a day (BID) | ORAL | Status: DC
  Administered 2021-11-02 – 2021-11-04 (×4): 360 mg via ORAL
  Filled 2021-11-02 (×4): qty 2

## 2021-11-02 MED ORDER — ACETAMINOPHEN 325 MG PO TABS
650.0000 mg | ORAL_TABLET | Freq: Four times a day (QID) | ORAL | Status: DC | PRN
  Administered 2021-11-03: 650 mg via ORAL
  Filled 2021-11-02: qty 2

## 2021-11-02 MED ORDER — METOLAZONE 2.5 MG PO TABS: 2.5000 mg | ORAL_TABLET | ORAL | Status: DC

## 2021-11-02 MED ORDER — NITROGLYCERIN 0.4 MG SL SUBL: 0.4000 mg | SUBLINGUAL_TABLET | SUBLINGUAL | Status: DC | PRN

## 2021-11-02 MED ORDER — TADALAFIL (PAH) 20 MG PO TABS
20.0000 mg | ORAL_TABLET | ORAL | Status: DC
  Administered 2021-11-04: 20 mg via ORAL
  Filled 2021-11-02: qty 1

## 2021-11-02 MED ORDER — MONTELUKAST SODIUM 10 MG PO TABS
10.0000 mg | ORAL_TABLET | Freq: Every day | ORAL | Status: DC
  Administered 2021-11-02 – 2021-11-03 (×2): 10 mg via ORAL
  Filled 2021-11-02 (×2): qty 1

## 2021-11-02 MED ORDER — HYDROMORPHONE HCL 1 MG/ML IJ SOLN
0.5000 mg | Freq: Once | INTRAMUSCULAR | Status: AC
  Administered 2021-11-02: 0.5 mg via INTRAVENOUS
  Filled 2021-11-02: qty 0.5

## 2021-11-02 MED ORDER — HEPARIN SODIUM (PORCINE) 5000 UNIT/ML IJ SOLN
4000.0000 [IU] | Freq: Once | INTRAMUSCULAR | Status: AC
  Administered 2021-11-02: 4000 [IU] via INTRAVENOUS
  Filled 2021-11-02: qty 1

## 2021-11-02 MED ORDER — COLCHICINE 0.6 MG PO TABS
0.6000 mg | ORAL_TABLET | ORAL | Status: DC
  Administered 2021-11-04: 0.6 mg via ORAL
  Filled 2021-11-02: qty 1

## 2021-11-02 MED ORDER — INSULIN ASPART 100 UNIT/ML IJ SOLN
0.0000 [IU] | Freq: Three times a day (TID) | INTRAMUSCULAR | Status: DC
  Administered 2021-11-03 (×2): 3 [IU] via SUBCUTANEOUS
  Administered 2021-11-04 (×2): 2 [IU] via SUBCUTANEOUS
  Filled 2021-11-02 (×5): qty 1

## 2021-11-02 MED ORDER — DAPAGLIFLOZIN PROPANEDIOL 10 MG PO TABS
10.0000 mg | ORAL_TABLET | Freq: Every day | ORAL | Status: DC
  Administered 2021-11-03 – 2021-11-04 (×2): 10 mg via ORAL
  Filled 2021-11-02 (×3): qty 1

## 2021-11-02 MED ORDER — POLYSACCHARIDE IRON COMPLEX 150 MG PO CAPS
150.0000 mg | ORAL_CAPSULE | Freq: Two times a day (BID) | ORAL | Status: DC
  Administered 2021-11-02 – 2021-11-04 (×4): 150 mg via ORAL
  Filled 2021-11-02 (×4): qty 1

## 2021-11-02 MED ORDER — PREDNISONE 50 MG PO TABS
50.0000 mg | ORAL_TABLET | Freq: Once | ORAL | Status: AC
Start: 2021-11-02 — End: 2021-11-02
  Administered 2021-11-02: 50 mg via ORAL
  Filled 2021-11-02: qty 1

## 2021-11-02 MED ORDER — AMLODIPINE BESYLATE 10 MG PO TABS
10.0000 mg | ORAL_TABLET | Freq: Every day | ORAL | Status: DC
  Administered 2021-11-03 – 2021-11-04 (×2): 10 mg via ORAL
  Filled 2021-11-02 (×2): qty 1

## 2021-11-02 MED ORDER — DM-GUAIFENESIN ER 30-600 MG PO TB12: 1.0000 | ORAL_TABLET | Freq: Two times a day (BID) | ORAL | Status: DC | PRN

## 2021-11-02 MED ORDER — INSULIN GLARGINE-YFGN 100 UNIT/ML ~~LOC~~ SOLN
20.0000 [IU] | Freq: Every day | SUBCUTANEOUS | Status: DC
  Administered 2021-11-02 – 2021-11-03 (×2): 20 [IU] via SUBCUTANEOUS
  Filled 2021-11-02 (×2): qty 0.2

## 2021-11-02 MED ORDER — DIPHENHYDRAMINE HCL 50 MG/ML IJ SOLN: 12.5000 mg | Freq: Three times a day (TID) | INTRAMUSCULAR | Status: DC | PRN

## 2021-11-02 MED ORDER — PANTOPRAZOLE SODIUM 40 MG PO TBEC
40.0000 mg | DELAYED_RELEASE_TABLET | Freq: Every day | ORAL | Status: DC
  Administered 2021-11-02 – 2021-11-04 (×3): 40 mg via ORAL
  Filled 2021-11-02 (×3): qty 1

## 2021-11-02 MED ORDER — SULFAMETHOXAZOLE-TRIMETHOPRIM 400-80 MG PO TABS: 1.0000 | ORAL_TABLET | ORAL | Status: DC

## 2021-11-02 MED ORDER — ROSUVASTATIN CALCIUM 10 MG PO TABS
10.0000 mg | ORAL_TABLET | Freq: Every day | ORAL | Status: DC
  Administered 2021-11-03 – 2021-11-04 (×2): 10 mg via ORAL
  Filled 2021-11-02 (×2): qty 1

## 2021-11-02 MED ORDER — INSULIN ASPART 100 UNIT/ML IJ SOLN
0.0000 [IU] | Freq: Every day | INTRAMUSCULAR | Status: DC
  Administered 2021-11-03: 3 [IU] via SUBCUTANEOUS
  Filled 2021-11-02: qty 1

## 2021-11-02 MED ORDER — HEPARIN (PORCINE) 25000 UT/250ML-% IV SOLN
1250.0000 [IU]/h | INTRAVENOUS | Status: DC
  Administered 2021-11-02: 1250 [IU]/h via INTRAVENOUS
  Filled 2021-11-02: qty 250

## 2021-11-02 MED ORDER — CLOPIDOGREL BISULFATE 75 MG PO TABS
600.0000 mg | ORAL_TABLET | Freq: Once | ORAL | Status: AC
  Administered 2021-11-02: 600 mg via ORAL
  Filled 2021-11-02: qty 8

## 2021-11-02 MED ORDER — MORPHINE SULFATE (PF) 2 MG/ML IV SOLN
2.0000 mg | INTRAVENOUS | Status: DC | PRN
  Administered 2021-11-02 – 2021-11-03 (×4): 2 mg via INTRAVENOUS
  Filled 2021-11-02 (×4): qty 1

## 2021-11-02 MED ORDER — ONDANSETRON HCL 4 MG/2ML IJ SOLN
4.0000 mg | Freq: Once | INTRAMUSCULAR | Status: AC
  Administered 2021-11-02: 4 mg via INTRAVENOUS
  Filled 2021-11-02: qty 2

## 2021-11-02 MED ORDER — VITAMIN D 25 MCG (1000 UNIT) PO TABS
1000.0000 [IU] | ORAL_TABLET | Freq: Every day | ORAL | Status: DC
  Administered 2021-11-02 – 2021-11-03 (×2): 1000 [IU] via ORAL
  Filled 2021-11-02 (×2): qty 1

## 2021-11-02 MED ORDER — POTASSIUM CHLORIDE CRYS ER 20 MEQ PO TBCR
20.0000 meq | EXTENDED_RELEASE_TABLET | Freq: Once | ORAL | Status: DC
  Filled 2021-11-02: qty 1

## 2021-11-02 MED ORDER — ALLOPURINOL 100 MG PO TABS: 100.0000 mg | ORAL_TABLET | Freq: Every day | ORAL | Status: DC

## 2021-11-02 MED ORDER — HYDRALAZINE HCL 20 MG/ML IJ SOLN: 5.0000 mg | INTRAMUSCULAR | Status: DC | PRN

## 2021-11-02 MED ORDER — GABAPENTIN 300 MG PO CAPS
300.0000 mg | ORAL_CAPSULE | Freq: Two times a day (BID) | ORAL | Status: DC
  Administered 2021-11-02 – 2021-11-04 (×4): 300 mg via ORAL
  Filled 2021-11-02 (×4): qty 1

## 2021-11-02 MED ORDER — APIXABAN 5 MG PO TABS
5.0000 mg | ORAL_TABLET | Freq: Two times a day (BID) | ORAL | Status: DC
Start: 2021-11-02 — End: 2021-11-02

## 2021-11-02 MED ORDER — ZINC SULFATE 220 (50 ZN) MG PO CAPS
220.0000 mg | ORAL_CAPSULE | Freq: Every day | ORAL | Status: DC
  Administered 2021-11-03 – 2021-11-04 (×2): 220 mg via ORAL
  Filled 2021-11-02 (×2): qty 1

## 2021-11-02 MED ORDER — TORSEMIDE 100 MG PO TABS
100.0000 mg | ORAL_TABLET | Freq: Two times a day (BID) | ORAL | Status: DC
  Administered 2021-11-02 – 2021-11-04 (×4): 100 mg via ORAL
  Filled 2021-11-02 (×5): qty 1

## 2021-11-02 MED ORDER — HEPARIN (PORCINE) 25000 UT/250ML-% IV SOLN: 10.0000 [IU]/kg/h | INTRAVENOUS | Status: DC

## 2021-11-02 MED ORDER — NITROGLYCERIN 2 % TD OINT
1.0000 [in_us] | TOPICAL_OINTMENT | Freq: Once | TRANSDERMAL | Status: AC
  Administered 2021-11-02: 1 [in_us] via TOPICAL
  Filled 2021-11-02: qty 1

## 2021-11-02 MED ORDER — SODIUM CHLORIDE 0.9 % IV SOLN: INTRAVENOUS | Status: DC

## 2021-11-02 MED ORDER — FLUTICASONE PROPIONATE 50 MCG/ACT NA SUSP
2.0000 | Freq: Every day | NASAL | Status: DC
  Administered 2021-11-03: 2 via NASAL
  Filled 2021-11-02: qty 16

## 2021-11-02 MED ORDER — TACROLIMUS 1 MG PO CAPS
2.0000 mg | ORAL_CAPSULE | Freq: Two times a day (BID) | ORAL | Status: DC
  Administered 2021-11-02 – 2021-11-04 (×4): 2 mg via ORAL
  Filled 2021-11-02 (×4): qty 2

## 2021-11-02 MED ORDER — BUDESONIDE 0.25 MG/2ML IN SUSP
0.2500 mg | Freq: Two times a day (BID) | RESPIRATORY_TRACT | Status: DC
  Filled 2021-11-02: qty 2

## 2021-11-02 MED ORDER — INSULIN GLARGINE-YFGN 100 UNIT/ML ~~LOC~~ SOPN: 20.0000 [IU] | PEN_INJECTOR | Freq: Every day | SUBCUTANEOUS | Status: DC

## 2021-11-02 MED ORDER — ALBUTEROL SULFATE (2.5 MG/3ML) 0.083% IN NEBU: 3.0000 mL | INHALATION_SOLUTION | RESPIRATORY_TRACT | Status: DC | PRN

## 2021-11-02 MED ORDER — APIXABAN 5 MG PO TABS
5.0000 mg | ORAL_TABLET | Freq: Two times a day (BID) | ORAL | Status: DC
  Administered 2021-11-02 – 2021-11-04 (×4): 5 mg via ORAL
  Filled 2021-11-02 (×4): qty 1

## 2021-11-02 NOTE — Assessment & Plan Note (Signed)
-  IV hydralazine as needed -Patient is on torsemide, metolazone -Amlodipine, oral hydralazine

## 2021-11-02 NOTE — Assessment & Plan Note (Signed)
Hx of CAD and type II MI due to multiple issues, including possible acute pancreatitis.  Troponin level 29 --> 30.  Initially started IV heparin which was discontinued per Dr. Garen Lah of cardiology  -will admitted to PCU as inpatient -As needed nitroglycerin and morphine -Continue Crestor -Noticed on aspirin since patient is on Eliquis -Trend troponin -Check A1c and FLP

## 2021-11-02 NOTE — Progress Notes (Signed)
ANTICOAGULATION CONSULT NOTE - Initial Consult  Pharmacy Consult for Heparin Drip Indication: chest pain/ACS  Allergies  Allergen Reactions   Contrast Media [Iodinated Contrast Media] Other (See Comments)    Kidney transplant   Iodine Other (See Comments)    Other reaction(s): Other (See Comments) Kidney transplant   Ibuprofen Other (See Comments)    Due to kidney transplant Due to kidney transplant Due to kidney transplant    Patient Measurements: Height: _0  (175.3 cm) Weight: 116.6 kg (257 lb) IBW/kg (Calculated) : 70.7 Heparin Dosing Weight: 96.8 kg  Vital Signs: Temp: 98.4 F (36.9 C) (08/11 1107) Temp Source: Oral (08/11 1107) BP: 128/67 (08/11 1330) Pulse Rate: 81 (08/11 1330)  Labs: Recent Labs    11/02/21 1118 11/02/21 1339  HGB 8.4*  --   HCT 28.3*  --   PLT 141*  --   APTT  --  128*  LABPROT  --  23.9*  INR  --  2.2*  HEPARINUNFRC  --  >1.10*  CREATININE 2.40*  --   TROPONINIHS 29* 30*    Estimated Creatinine Clearance: 40.2 mL/min (A) (by C-G formula based on SCr of 2.4 mg/dL (H)).   Medical History: Past Medical History:  Diagnosis Date   Asthma, persistent controlled 02/25/2013   Attention or concentration deficit 04/26/2021   BPH with obstruction/lower urinary tract symptoms 09/25/2014   CAD (coronary artery disease) 2005   a.  Status post four-vessel CABG in 2005; b. 03/2018 MV: Small, fixed inferoapical and apical septal defect. No ischemia. EF 47% (60-65% by 05/2018 Echo).   Cellulitis and abscess of left leg 07/22/2020   Chronic atrial fibrillation    a. CHADS2VASc 4 (CHF, HTN, DM, vascular disease)--> Eliquis; b. 09/2018 Zio: Chronic Afib.   Chronic combined systolic and diastolic CHF (congestive heart failure)    a.  7/18 Echo: EF 45-50%; b. 05/2018 Echo: EF 60-65%, RVSP 74.8; c. 12/2018 Echo: EF 50-55%. Sev dil LA; d. 04/2019 Echo: EF 45-50%, glob HK. Mod reduced RV fxn. RVSP 53.756mHg.   Chronic venous stasis dermatitis of both lower  extremities    Cytomegaloviral disease 2017   Diverticulosis of colon 04/22/2013   Essential hypertension 12/17/2010   GERD (gastroesophageal reflux disease)    Gout attack 03/30/2018   History of renal transplant 03/01/2013   Hyperlipidemia    low since renal failure   Hypogonadism in male 09/25/2014   Major depressive disorder 10/04/2013   Obesity hypoventilation syndrome 03/31/2012   Obstructive sleep apnea 09/29/2012   NPSG [place] 03/15/12- AHI 57.9/ hr, desaturation to 78.8%, body weight 310 lbs   PAH (pulmonary artery hypertension)    a. 05/2018 Echo: RVSP 74.863mg; b. 12/2018 RV not well visualized; c. 04/2019 RHC: RA 19, RV 80/20, PA 78/31 (51), PCWP 25, CO/CI 7.69/3.26. PVR 3.25 WU-->Sev PAH, likely primarily PV HTN.   Synovitis of finger 06/11/2019   Trigger finger, unspecified little finger 12/23/2018   Type 2 diabetes mellitus with hyperglycemia, with long-term current use of insulin 09/09/2014   Ulcer 05/2016   Left shin   Wears glasses     Medications:  Apixaban 56m73mid prior to admission  Assessment: Patient is a [age]yole admitted with chest pain. Pharmacy consulted for Heparin dosing. Noted that patient was taking Apixaban prior to admission.  Baseline HL is elevated.  Goal of Therapy:  Heparin level 0.3-0.7 units/ml aPTT 66-102 seconds Monitor platelets by anticoagulation protocol: Yes   Plan:  Heparin 4000 units IV bolus Heparin 1250 units/hr infusion  Baseline HL is elevated due to apixaban. Will use aPTT to manage infusion. Will check aPTT in 6 hours. Check HL daily until correlates with aPTT CBC daily while on Heparin drip  Paulina Fusi, PharmD, BCPS 11/02/2021 2:18 PM

## 2021-11-02 NOTE — ED Provider Notes (Addendum)
[facility] Provider Note    Event Date/Time   First MD Initiated Contact with Patient 11/02/21 1117     (approximate)   History   Chest Pain   HPI  [name]. is a 61 y.o. male with past history of CABG who developed severe chest pain rating to the left arm with shortness of breath at about 8:00 today.  Nitro seem to make it somewhat better.  Patient has a transplanted kidney and has a creatinine of 2.7 just a few weeks ago was 2.2.  EMS did not give him aspirin I have not either I will give him Plavix and heparin.      Physical Exam   Triage Vital Signs: ED Triage Vitals [11/02/21 1107]  Enc Vitals Group     BP (!) 101/55     Pulse Rate 84     Resp 18     Temp 98.4 F (36.9 C)     Temp Source Oral     SpO2 98 %     Weight 257 lb (116.6 kg)     Height _0  (1.753 m)     Head Circumference      Peak Flow      Pain Score 8     Pain Loc      Pain Edu?      Excl. in [place]?     Most recent vital signs: Vitals:   11/02/21 1200 11/02/21 1230  BP: 132/65 121/62  Pulse: 79 78  Resp: 11 14  Temp:    SpO2: 100% 97%     General: Awake, looks uncomfortable CV:  Good peripheral perfusion.  Heart regular rate and rhythm no audible murmurs! Resp:  Normal effort.  Lungs are clear Abd:  No distention.  Soft and nontender Extremities with no edema   ED Results / Procedures / Treatments   Labs (all labs ordered are listed, but only abnormal results are displayed) Labs Reviewed  COMPREHENSIVE METABOLIC PANEL - Abnormal; Notable for the following components:      Result Value   Potassium 3.4 (*)    Glucose, Bld 115 (*)    BUN 59 (*)    Creatinine, Ser 2.40 (*)    Calcium 8.6 (*)    Albumin 3.4 (*)    GFR, Estimated 30 (*)    All other components within normal limits  CBC - Abnormal; Notable for the following components:   RBC 3.40 (*)    Hemoglobin 8.4 (*)    HCT 28.3 (*)    MCH 24.7 (*)    MCHC 29.7 (*)    Platelets 141  (*)    All other components within normal limits  TROPONIN I (HIGH SENSITIVITY) - Abnormal; Notable for the following components:   Troponin I (High Sensitivity) 29 (*)    All other components within normal limits  HEPARIN LEVEL (UNFRACTIONATED)  PROTIME-INR  APTT  TROPONIN I (HIGH SENSITIVITY)     EKG  EKG read interpreted by me shows bigeminy at a rate of 86 normal beats look like there is a normal axis there is some ST segment depression inferiorly and otherwise there is some artifact.   RADIOLOGY Chest x-ray shows enlarged heart with some increased vascular markings consistent with CHF.  Radiology read the film I reviewed and interpreted as well   PROCEDURES:  Critical Care performed: Critical care time half an hour.  This includes speaking with the patient evaluating him so far 3 times  to check on his chest pain.  Increasing his dosage of Nitropaste. Procedures   MEDICATIONS ORDERED IN ED: Medications  heparin ADULT infusion 100 units/mL (25000 units/219m) (1,250 Units/hr Intravenous New Bag/Given 11/02/21 1209)  nitroGLYCERIN (NITROGLYN) 2 % ointment 1 inch (1 inch Topical Given 11/02/21 1131)  clopidogrel (PLAVIX) tablet 600 mg (600 mg Oral Given 11/02/21 1211)  heparin injection 4,000 Units (4,000 Units Intravenous Given 11/02/21 1210)     IMPRESSION / MDM / ASSESSMENT AND PLAN / ED COURSE  I reviewed the triage vital signs and the nursing notes.  Patient's presentation is most consistent with unstable angina due to coronary artery disease.  His pain got better and resolved with nitro and then came back when with exertion resolved again with more nitro he has a history of CABG x4 he had no pain with that.  He was diagnosed doing a stress test. Differential diagnosis includes, but is not limited to, unstable angina or NSTEMI.  Symptoms do not fit with pulmonary embolus or pneumonia and he does not have a pneumonia on chest x-ray.  They do not fit with pleurisy or  pericarditis or muscle pain.  Patient's presentation is most consistent with acute presentation with potential threat to life or bodily function.  The patient is on the cardiac monitor to evaluate for evidence of arrhythmia and/or significant heart rate changes.  Possible junctional rhythm at 1 point.      FINAL CLINICAL IMPRESSION(S) / ED DIAGNOSES   Final diagnoses:  Unstable [place]ina [place]     Rx / DC Orders   ED Discharge Orders     None        Note:  This document was prepared using Dragon voice recognition software and may include unintentional dictation errors.   MNena Polio MD 11/02/21 1322 ----------------------------------------- 2:25 PM on 11/02/2021 ----------------------------------------- Has sudden onset of severe left flank pain from about the 10th rib down toward the pelvis.  Come on suddenly and somewhat tender to palpation.  This is not the pain he was having earlier.  His kidney transplant is on the right lower quadrant.  I spoke with Dr. NMarjean Donnais coming down to see the patient.  We will give the patient some pain meds put him on it pacer pad as he seems to have developed complete heart block now.  EKG done shows heart rate of 84 wide QRS complexes no apparent P waves CT abdomen pelvis as well.   MNena Polio MD 11/02/21 1427

## 2021-11-02 NOTE — Assessment & Plan Note (Signed)
-  Continue home medications

## 2021-11-02 NOTE — Assessment & Plan Note (Signed)
Recent A1c 7.6, poorly controlled.  Patient is taking Humalog, glargine insulin 40 units daily, also on [place] due to CHF -Glargine insulin 20 units daily -Slight scale insulin

## 2021-11-02 NOTE — Assessment & Plan Note (Signed)
-  Continue home tacrolimus, mycophenolate -Increase prednisone dose from 5 to 15 unit daily due to stress -Give prednisone 50 mg as stress dose now (Solu-Cortef is in shortage)

## 2021-11-02 NOTE — Assessment & Plan Note (Addendum)
2D echo on 08/16/2021 showed EF of 40-45%.  Clinically appears euvolemic but has an elevated BNP 413, at high risk of developing CHF exacerbation. -Continue home torsemide and metolazone 2.5 mg daily -Watch volume status closely

## 2021-11-02 NOTE — Assessment & Plan Note (Signed)
Potassium 4.3 -Monitor and replete as needed

## 2021-11-02 NOTE — Assessment & Plan Note (Signed)
Possible acute pancreatitis: -See above

## 2021-11-02 NOTE — Assessment & Plan Note (Signed)
Renal function close to baseline.  Recent baseline creatinine 1.6-2.4.  His creatinine is at 2.40, BUN 59 -Hold volume status closely

## 2021-11-02 NOTE — Assessment & Plan Note (Addendum)
Stable -Bronchodilators

## 2021-11-02 NOTE — Assessment & Plan Note (Signed)
-  Continue home tadalafil

## 2021-11-02 NOTE — Assessment & Plan Note (Addendum)
Heart rate well controlled -Continue Eliquis

## 2021-11-02 NOTE — Consult Note (Signed)
Cardiology Consultation:   Patient ID: [name]. MRN: [id number]; DOB: [birth year]  Admit date: 11/02/2021 Date of Consult: 11/02/2021  PCP:  [name], MD   [facility] Providers Cardiologist:  [name], MD  Advanced Heart Failure:  Loralie Champagne, MD       Patient Profile:   [name]. is a 61 y.o. male with a hx of atrial fibrillation, ischemic cardiomyopathy, CAD/CABG who is being seen 11/02/2021 for the evaluation of chest pain at the request of Dr. Blaine Hamper.  History of Present Illness:   Mr. Law is a 61 year old male with history of CAD/CABG (2005), ischemic cardiomyopathy last EF 40 to 45%, chronic atrial fibrillation on Eliquis, pulmonary hypertension, previously end-stage renal disease s/p kidney transplant 2012, currently CKD 3, OSA on BiPAP who presents due to chest pain.  Patient was at home when he suddenly felt chest pain radiating down his left arm this morning, associated with shortness of breath.  Due to chronic cardiac history, wife called EMS and patient brought to the hospital.  He was given some nitro sprays under his tongue, and Nitropaste placed in the emergency room with improvement in chest pain symptoms.  Does not take aspirin due to kidney disease.  Follows up at [place] renal transplant team.  Also complains of left flank pain reproducible with palpation.  In the ED EKG showed A-fib/flutter with PVCs.  Troponins were 29, 30.  Started on heparin per ED physician for possible ACS, given Plavix.    Of note, patient has history of beta-blocker induced bradycardia, Ranexa not used due to renal dysfunction, nitrates have been avoided due to patient taking tadalafil for pulmonary hypertension.   Past Medical History:  Diagnosis Date   Asthma, persistent controlled 02/25/2013   Attention or concentration deficit 04/26/2021   BPH with obstruction/lower urinary tract symptoms 09/25/2014   CAD (coronary artery disease) 2005    a.  Status post four-vessel CABG in 2005; b. 03/2018 MV: Small, fixed inferoapical and apical septal defect. No ischemia. EF 47% (60-65% by 05/2018 Echo).   Cellulitis and abscess of left leg 07/22/2020   Chronic atrial fibrillation    a. CHADS2VASc 4 (CHF, HTN, DM, vascular disease)--> Eliquis; b. 09/2018 [place]: Chronic Afib.   Chronic combined systolic and diastolic CHF (congestive heart failure)    a.  7/18 Echo: EF 45-50%; b. 05/2018 Echo: EF 60-65%, RVSP 74.8; c. 12/2018 Echo: EF 50-55%. Sev dil LA; d. 04/2019 Echo: EF 45-50%, glob HK. Mod reduced RV fxn. RVSP 53.47mHg.   Chronic venous stasis dermatitis of both lower extremities    Cytomegaloviral disease 2017   Diverticulosis of colon 04/22/2013   Essential hypertension 12/17/2010   GERD (gastroesophageal reflux disease)    Gout attack 03/30/2018   History of renal transplant 03/01/2013   Hyperlipidemia    low since renal failure   Hypogonadism in male 09/25/2014   Major depressive disorder 10/04/2013   Obesity hypoventilation syndrome 03/31/2012   Obstructive sleep apnea 09/29/2012   NPSG [place] 03/15/12- AHI 57.9/ hr, desaturation to 78.8%, body weight 310 lbs   PAH (pulmonary artery hypertension)    a. 05/2018 Echo: RVSP 74.853mg; b. 12/2018 RV not well visualized; c. 04/2019 RHC: RA 19, RV 80/20, PA 78/31 (51), PCWP 25, CO/CI 7.69/3.26. PVR 3.25 WU-->Sev PAH, likely primarily PV HTN.   Synovitis of finger 06/11/2019   Trigger finger, unspecified little finger 12/23/2018   Type 2 diabetes mellitus with hyperglycemia, with long-term current use of insulin  09/09/2014   Ulcer 05/2016   Left shin   Wears glasses     Past Surgical History:  Procedure Laterality Date   BARIATRIC SURGERY     CARDIAC CATHETERIZATION     [facility]   CARDIAC CATHETERIZATION  05/03/2019   CATARACT EXTRACTION W/PHACO Right 10/29/2017   Procedure: CATARACT EXTRACTION PHACO AND INTRAOCULAR LENS PLACEMENT ([place])  RIGHT DIABETIC;  Surgeon: [name],  MD;  Location: [place];  Service: Ophthalmology;  Laterality: Right;  Diabetic - insulin   CATARACT EXTRACTION W/PHACO Left 11/18/2017   Procedure: CATARACT EXTRACTION PHACO AND INTRAOCULAR LENS PLACEMENT ([place]) LEFT IVA/TOPICAL;  Surgeon: [name], MD;  Location: [place];  Service: Ophthalmology;  Laterality: Left;  DIABETES - insulin sleep apnea   COLONOSCOPY WITH PROPOFOL N/A 04/22/2013   Procedure: COLONOSCOPY WITH PROPOFOL;  Surgeon: [name], MD;  Location: [facility];  Service: Endoscopy;  Laterality: N/A;   CORONARY ARTERY BYPASS GRAFT  2005   X 4   DG ANGIO AV SHUNT*L*     right and left upper arms   FASCIOTOMY  03/03/2012   Procedure: FASCIOTOMY;  Surgeon: [name], MD;  Location: [place];  Service: Orthopedics;  Laterality: Right;  FASCIOTOMY RIGHT SMALL FINGER   FASCIOTOMY Left 08/17/2013   Procedure: FASCIOTOMY LEFT RING;  Surgeon: [name], MD;  Location: [place];  Service: Orthopedics;  Laterality: Left;   INCISION AND DRAINAGE ABSCESS Left 10/15/2015   Procedure: INCISION AND DRAINAGE ABSCESS;  Surgeon: [name], MD;  Location: [facility];  Service: General;  Laterality: Left;   KIDNEY TRANSPLANT  09/13/2010   cadaver--at [facility] HEART CATH N/A 11/15/2016   Procedure: RIGHT HEART CATH;  Surgeon: [name], MD;  Location: [place] CV [facility];  Service: Cardiovascular;  Laterality: N/A;   RIGHT HEART CATH N/A 05/03/2019   Procedure: RIGHT HEART CATH;  Surgeon: [name], MD;  Location: [name] [facility];  Service: Cardiovascular;  Laterality: N/A;   TYMPANIC MEMBRANE REPAIR  1/12   left   VASECTOMY       Home Medications:  Prior to Admission medications   Medication Sig Start Date End Date Taking? Authorizing Provider  albuterol (PROVENTIL) (2.5 MG/3ML) 0.083% nebulizer solution Take 3 mLs (2.5 mg total) by nebulization every 6 (six) hours as needed for wheezing or  shortness of breath. 01/02/21   Michela Pitcher, NP  albuterol (VENTOLIN HFA) 108 (90 Base) MCG/ACT inhaler Inhale 2 puffs into the lungs every 6 (six) hours as needed for wheezing or shortness of breath. 10/17/20   Martyn Ehrich, NP  allopurinol (ZYLOPRIM) 100 MG tablet Take 100 mg by mouth daily.    [provider]  amLODipine (NORVASC) 10 MG tablet Take 10 mg by mouth daily.    [provider]  amoxicillin (AMOXIL) 500 MG capsule Take 4 capsules (2,000 mg total) by mouth as needed (Take 1 hr before dental appointment.). Patient not taking: Reported on 10/31/2021 07/06/21     apixaban (ELIQUIS) 5 MG TABS tablet TAKE 1 TABLET BY MOUTH TWICE DAILY 08/21/21 08/21/22  [name], MD  beclomethasone (QVAR) 80 MCG/ACT inhaler Inhale 2 puffs into the lungs 2 (two) times daily. 10/17/20   Martyn Ehrich, NP  buPROPion [facility] SR) 150 MG 12 hr tablet TAKE 1 TABLET BY MOUTH TWICE DAILY 05/29/21 05/29/22  Dutch Quint B, FNP  cholecalciferol (VITAMIN D3) 25 MCG (1000 UNIT) tablet  Take 1,000 Units by mouth at bedtime.    [provider]  colchicine 0.6 MG tablet Take 1 tablet (0.6 mg total) by mouth every other day 10/22/21     Continuous Blood Gluc Sensor (FREESTYLE LIBRE 3 SENSOR) MISC Use one every 2 weeks. 05/28/21     dapagliflozin propanediol (FARXIGA) 10 MG TABS tablet Take 1 tablet (10 mg total) by mouth daily before breakfast. 08/23/21   Milford, Maricela Bo, FNP  Febuxostat 80 MG TABS Take 1 tablet (80 mg total) by mouth once daily for 180 days 10/22/21     fluticasone (FLONASE) 50 MCG/ACT nasal spray PLACE 2 SPRAYS INTO BOTH NOSTRILS DAILY. 08/03/21 08/03/22  [name], MD  gabapentin (NEURONTIN) 300 MG capsule TAKE 1 CAPSULE BY MOUTH TWICE DAILY 05/22/21     hydrALAZINE (APRESOLINE) 25 MG tablet Take 1/2 tablet (12.5 mg total) by mouth every 8 (eight) hours. 10/01/21 10/31/21  [name], MD  hydrocortisone 2.5 % cream Apply topically 3 (three) times daily as  needed. 10/30/21   [name], MD  insulin glargine-yfgn (SEMGLEE) 100 UNIT/ML Pen Inject 40 Units subcutaneously once daily Patient taking differently: Inject 40 Units into the skin at bedtime. 05/28/21     insulin lispro (HUMALOG KWIKPEN) 100 UNIT/ML KwikPen INJECT UP TO 50 UNITS UNDER THE SKIN DAILY AS DIRECTED 10/10/21     Insulin Pen Needle (UNIFINE PENTIPS) 31G X 5 MM MISC Use 4 times daily as directed 05/28/21     iron polysaccharides (FERREX 150) 150 MG capsule TAKE 1 CAPSULE BY MOUTH 2 TIMES DAILY. 06/22/21   [name], MD  metolazone (ZAROXOLYN) 2.5 MG tablet Take 1 tablet by mouth 2 days per week 08/01/21   Rafael Bihari, FNP  montelukast (SINGULAIR) 10 MG tablet TAKE 1 TABLET BY MOUTH AT BEDTIME. 10/17/20 10/31/21  Martyn Ehrich, NP  Multiple Vitamin (MULTIVITAMIN WITH MINERALS) TABS tablet Take 1 tablet by mouth daily.    [provider]  mycophenolate (MYFORTIC) 180 MG EC tablet Take 360 mg by mouth 2 (two) times daily.    [provider]  omeprazole (PRILOSEC) 20 MG capsule TAKE 1 CAPSULE BY MOUTH DAILY. Patient taking differently: Take 20 mg by mouth at bedtime. 12/28/20 01/07/22  [name], MD  Potassium Chloride CR (MICRO-K) 8 MEQ CPCR capsule CR Take 10 capsules (80 mEq total) by mouth 2 (two) times daily. 09/18/21   [name], MD  predniSONE (DELTASONE) 5 MG tablet Take 1 tablet (5 mg total) by mouth daily. 10/24/21     rosuvastatin (CRESTOR) 10 MG tablet Take 1 tablet (10 mg total) by mouth daily. 07/25/21   [name], MD  sulfamethoxazole-trimethoprim (BACTRIM) 400-80 MG tablet TAKE 1 TABLET BY MOUTH EVERY MONDAY, [place], FRIDAY. 09/21/21     tacrolimus (PROGRAF) 1 MG capsule Take 2 capsules (2 mg total) by mouth 2 times daily. 04/30/21     tadalafil, PAH, (ADCIRCA) 20 MG tablet TAKE 1 TABLET (20 MG TOTAL) BY MOUTH EVERY OTHER DAY. 10/01/21 10/01/22  [name], MD  tamsulosin (FLOMAX) 0.4 MG CAPS capsule TAKE 1 CAPSULE BY  MOUTH DAILY. 05/28/21     torsemide (DEMADEX) 100 MG tablet Take 1 tablet (100 mg total) by mouth 2 (two) times daily. 08/23/21   Milford, Maricela Bo, FNP  zinc sulfate 220 (50 Zn) MG capsule Take 220 mg by mouth daily.    [provider]    Inpatient Medications: Scheduled Meds:  insulin aspart  0-5 Units Subcutaneous QHS   insulin aspart  0-9 Units Subcutaneous TID WC   potassium chloride  20 mEq Oral Once   predniSONE  50 mg Oral Once   Continuous Infusions:  sodium chloride     PRN Meds: acetaminophen, albuterol, dextromethorphan-guaiFENesin, diphenhydrAMINE, hydrALAZINE, morphine injection, nitroGLYCERIN  Allergies:    Allergies  Allergen Reactions   Contrast Media [Iodinated Contrast Media] Other (See Comments)    Kidney transplant   Iodine Other (See Comments)    Other reaction(s): Other (See Comments) Kidney transplant   Ibuprofen Other (See Comments)    Due to kidney transplant Due to kidney transplant Due to kidney transplant    Social History:   Social History   Socioeconomic History   Marital status: Married    Spouse name: Not on file   Number of children: 2   Years of education: 14   Highest education level: Associate degree: academic program  Occupational History   Occupation: Warden/ranger: unemployed    Comment: disabled due to kidney failure  Tobacco Use   Smoking status: Former    Types: Cigars    Quit date: 09/02/1994    Years since quitting: 27.1    Passive exposure: Never   Smokeless tobacco: Never   Tobacco comments:    Occasional, rare cigars  Vaping Use   Vaping Use: Never used  Substance and Sexual Activity   Alcohol use: Yes    Alcohol/week: 0.0 - 1.0 standard drinks of alcohol    Comment: occasional - 12-pack lasts a month   Drug use: No   Sexual activity: Not on file  Other Topics Concern   Not on file  Social History Narrative   Has living will   Wife is health care POA   Would accept  resuscitation attempts   No tube feedings if cognitively unaware   Social Determinants of Health   Financial Resource Strain: Not on file  Food Insecurity: Not on file  Transportation Needs: Not on file  Physical Activity: Not on file  Stress: Not on file  Social Connections: Not on file  Intimate Partner Violence: Not on file    Family History:    Family History  Problem Relation Age of Onset   Heart disease Father    Kidney failure Father    Kidney disease Father    Diabetes Maternal Grandmother    Breast cancer Maternal Grandmother    Valvular heart disease Mother    Liver cancer Paternal Uncle    Liver cancer Paternal Grandmother    Prostate cancer Neg Hx      ROS:  Please see the history of present illness.   All other ROS reviewed and negative.     Physical Exam/Data:   Vitals:   11/02/21 1400 11/02/21 1430 11/02/21 1451 11/02/21 1500  BP: 125/85 133/61  117/66  Pulse: 88 87  (!) 42  Resp: (!) _0 Temp:   98.6 F (37 C)   TempSrc:   Oral   SpO2: 94% 98%  93%  Weight:      Height:        Intake/Output Summary (Last 24 hours) at 11/02/2021 1649 Last data filed at 11/02/2021 1450 Gross per 24 hour  Intake 68.53 ml  Output --  Net 68.53 ml      11/02/2021   11:07 AM 10/31/2021   12:35 PM 10/30/2021    8:15 AM  Last 3 Weights  Weight (lbs) 257  lb 255 lb 268 lb  Weight (kg) 116.574 kg 115.667 kg 121.564 kg     Body mass index is 37.95 kg/m.  General:  Well nourished, well developed, mild distress HEENT: normal Neck: no JVD Vascular: No carotid bruits; Distal pulses 2+ bilaterally Cardiac: Regular rate, occasional skipped heartbeats. Lungs: Diminished breath sounds at bases Abd: soft, left flank tenderness, distended Ext: no edema Musculoskeletal:  No deformities, BUE and BLE strength normal and equal Skin: warm and dry  Neuro:  CNs 2-12 intact, no focal abnormalities noted Psych:  Normal affect   EKG:  The EKG was personally reviewed and  demonstrates: A-fib/flutter with PVCs Telemetry:  Telemetry was personally reviewed and demonstrates: A-fib/flutter with PVCs  Relevant CV Studies:  TTE 07/2021 1. Left ventricular ejection fraction, by estimation, is 40 to 45%. Left  ventricular ejection fraction by PLAX is 40 %. The left ventricle has  mildly decreased function. The left ventricle demonstrates global  hypokinesis. The left ventricular internal  cavity size was moderately dilated. There is moderate left ventricular  hypertrophy. Left ventricular diastolic parameters are consistent with  Grade III diastolic dysfunction (restrictive). Elevated left ventricular  end-diastolic pressure.   2. Right ventricular systolic function is mildly reduced. The right  ventricular size is normal. Tricuspid regurgitation signal is inadequate  for assessing PA pressure.   3. Left atrial size was severely dilated.   4. Right atrial size was moderately dilated.   5. A small pericardial effusion is present. The pericardial effusion is  circumferential. There is no evidence of cardiac tamponade.   6. The mitral valve is abnormal. Mild mitral valve regurgitation.  Moderate to severe mitral annular calcification.   7. The aortic valve is tricuspid. Aortic valve regurgitation is not  visualized.   8. The inferior vena cava is dilated in size with <50% respiratory  variability, suggesting right atrial pressure of 15 mmHg.   Myoview 02/2021   Findings are consistent with prior myocardial infarction. The study is low risk.   No ST deviation was noted.   LV perfusion is abnormal. There is no evidence of ischemia. There is evidence of infarction. Defect 1: There is a small defect with moderate reduction in uptake present in the apical inferior location(s) that is fixed.   Left ventricular function is abnormal. Global function is mildly reduced. End diastolic cavity size is moderately enlarged. End systolic cavity size is moderately enlarged.    Prior study available for comparison from 04/10/2018. No changes compared to prior study.   No evidence of ischemia.  Laboratory Data:  High Sensitivity Troponin:   Recent Labs  Lab 11/02/21 1118 11/02/21 1339  TROPONINIHS 29* 30*     Chemistry Recent Labs  Lab 11/02/21 1118  NA 141  K 3.4*  CL 100  CO2 31  GLUCOSE 115*  BUN 59*  CREATININE 2.40*  CALCIUM 8.6*  MG 2.1  GFRNONAA 30*  ANIONGAP 10    Recent Labs  Lab 11/02/21 1118  PROT 6.8  ALBUMIN 3.4*  AST 18  ALT 9  ALKPHOS 121  BILITOT 0.9   Lipids  Recent Labs  Lab 11/02/21 1118  CHOL 68  TRIG 74  HDL 31*  LDLCALC 22  CHOLHDL 2.2    Hematology Recent Labs  Lab 11/02/21 1118 11/02/21 1418  WBC 5.4  --   RBC 3.40* 3.45*  HGB 8.4*  --   HCT 28.3*  --   MCV 83.2  --   MCH 24.7*  --  MCHC 29.7*  --   RDW 15.3  --   PLT 141*  --    Thyroid No results for input(s): "TSH", "FREET4" in the last 168 hours.  BNP Recent Labs  Lab 11/02/21 1118  BNP 413.0*    DDimer No results for input(s): "DDIMER" in the last 168 hours.   Radiology/Studies:  CT ABDOMEN PELVIS WO CONTRAST  Result Date: 11/02/2021 CLINICAL DATA:  Left flank pain. EXAM: CT ABDOMEN AND PELVIS WITHOUT CONTRAST TECHNIQUE: Multidetector CT imaging of the abdomen and pelvis was performed following the standard protocol without IV contrast. RADIATION DOSE REDUCTION: This exam was performed according to the departmental dose-optimization program which includes automated exposure control, adjustment of the mA and/or kV according to patient size and/or use of iterative reconstruction technique. COMPARISON:  February 22, 2003. FINDINGS: Lower chest: No acute abnormality. Hepatobiliary: No focal liver abnormality is seen. Status post cholecystectomy. No biliary dilatation. Pancreas: Mild inflammatory stranding is noted around the pancreatic head and body concerning for acute pancreatitis. No pseudocyst formation is noted. Spleen: Moderate  splenomegaly is noted. Adrenals/Urinary Tract: Adrenal glands appear normal. Severe bilateral renal atrophy is noted consistent with end-stage renal disease. No hydronephrosis or renal obstruction is noted. Urinary bladder is unremarkable. Renal transplant is noted in the right lower quadrant without definite evidence of hydronephrosis. Stomach/Bowel: Status post gastric bypass. There is no evidence of bowel obstruction or inflammation. Vascular/Lymphatic: Aortic atherosclerosis. No enlarged abdominal or pelvic lymph nodes. Reproductive: Prostate is unremarkable. Other: No definite hernia is noted. Ill-defined area peritoneal fat stranding is noted inferior and lateral to the left kidney in the left pararenal space concerning for focal inflammation. This area measures 8.5 x 7.9 cm. Musculoskeletal: No acute or significant osseous findings. IMPRESSION: Mild inflammatory stranding is noted around the pancreatic head and body concerning for acute pancreatitis. No pseudocyst formation is noted. Severe bilateral renal atrophy is noted consistent with history of end-stage renal disease. Right lower quadrant renal transplant is noted without hydronephrosis. However, there is a 9.5 x 7.9 cm ill-defined area of fat stranding in the left pararenal space inferior and lateral to the left kidney most consistent with focal inflammation or infarction. No definite fluid collection is seen at this time. Moderate splenomegaly is noted. Aortic Atherosclerosis (ICD10-I70.0). Electronically Signed   By: Marijo Conception M.D.   On: 11/02/2021 15:50   DG Chest 2 View  Result Date: 11/02/2021 CLINICAL DATA:  Chest pain. EXAM: CHEST - 2 VIEW COMPARISON:  Chest x-ray May 24, 23. FINDINGS: Chronic enlargement the cardiac silhouette and pulmonary vascular congestion. Median sternotomy. No overt pulmonary edema. No consolidation. No visible pleural effusions or pneumothorax. Unchanged right paratracheal calcified lymph node. IMPRESSION:  Chronic cardiomegaly and pulmonary vascular congestion without overt pulmonary edema. Electronically Signed   By: Margaretha Sheffield M.D.   On: 11/02/2021 12:06   US SCROTUM W/DOPPLER  Result Date: 10/30/2021 CLINICAL DATA:  Scrotal pain and swelling. Varicocele by physical exam. EXAM: SCROTAL ULTRASOUND DOPPLER ULTRASOUND OF THE TESTICLES TECHNIQUE: Complete ultrasound examination of the testicles, epididymis, and other scrotal structures was performed. Color and spectral Doppler ultrasound were also utilized to evaluate blood flow to the testicles. COMPARISON:  None Available. FINDINGS: Right testicle Measurements: 4.8 x 2.7 x 3.1 cm. No mass or microlithiasis visualized. There is scattered tubular ectasia of the rete testis along the testicular mediastinum. There is small complex ovoid extratesticular tunical lesion at the anteromedial aspect of the superior pole of this testicle measuring 6.2 x 2.6 by  5.7 mm, possibly a complex tunical cyst or tunical granuloma. This does not register color flow on image 5/48. It appears to have a mostly if not completely solid architecture. Left testicle Measurements: 4.4 x 2.3 x 2.9 cm. No mass or microlithiasis visualized. There is tubular ectasia of the rete testis which is more advanced than on the right. Right epididymis:  Normal in size and appearance. Left epididymis:  Normal in size and appearance. Hydrocele: There are symmetric bilateral small scrotal hydroceles with floating debris in the fluid bilaterally. Varicocele: None visualized on the right. On the left, there is a tangle of multiple ectatic veins in the lateral hemiscrotum measuring up to 4.1 mm with Valsalva consistent with a varicocele. Pulsed Doppler interrogation of both testes demonstrates normal low resistance arterial and venous waveforms bilaterally. IMPRESSION: 1. No evidence of testicular torsion, testicular mass or hyperemia, or epididymal hyperemia. 2. 6.2 x 2.6 x 5.7 mm extratesticular tunical  lesion at the anteromedial aspect of the superior pole of the right testicle, appears mostly if not completely solid and could be a complex tunical cyst or granuloma. Other etiologies are possible. Consider 6-12 month follow-up ultrasound to ensure stability. There is no positive color flow. 3. Left-greater-than-right tubular ectasia of the rete testis. 4. Small scrotal hydroceles, both with floating debris. Electronically Signed   By: Telford Nab M.D.   On: 10/30/2021 20:39     Assessment and Plan:   Chest pain, history of CAD/CABG -Chest pain improved with Nitropaste -Troponin elevation not consistent with ACS -Stop heparin -Last Myoview showing fixed defect with no ischemia -Not candidate for invasive procedures/left heart cath due to renal dysfunction s/p transplant -Antianginals either not tolerated or contraindicated for patient -only option is likely to consider sublingual nitro as needed moving forward.  He takes tadalafil for pulmonary hypertension as such nitrates not ideal as standing dose.  Discussed in detail with wife and patient.  2.  Chronic A-fib -Heart rate controlled off AV nodal agents -Stop heparin, restart Eliquis 5 twice daily  3.  Ischemic cardiomyopathy -Appears euvolemic -History of bradycardia with beta-blockers -Continue Farxiga, hydralazine -Restart PTA torsemide, metolazone. -Avoiding ARB, Aldactone due to renal injury.  4.  Flank tenderness left -Work-up as per medicine team -Abdominal CT was ordered  5.  S/p renal transplant -Restart transplant meds-continue prednisone, tacrolimus.  6.  Pulmonary hypertension, OSA, -PTA meds-tadalafil, BiPAP nightly  Total encounter time more than 85 minutes  Greater than 50% was spent in counseling and coordination of care with the patient  Signed, Kate Sable, MD  11/02/2021 4:49 PM

## 2021-11-02 NOTE — H&P (Signed)
History and Physical    [name]. UXN:[id number] DOB: [birth year] DOA: 11/02/2021  Referring MD/NP/PA:   PCP: [name], MD   Patient coming from:  The patient is coming from home.  At baseline, pt is independent for most of ADL.        Chief Complaint: chest pain and left flank pain  HPI: [name]. is a 61 y.o. male with medical history significant of CAD, s/p of CABG, s/p of  kidney transplantation on immunosuppression and prednisone, hypertension, hyperlipidemia, diabetes mellitus, asthma, GERD, gout, depression, PAH, OSA, CHF with EF of 40-45%, atrial fibrillation on Eliquis, who presents with chest pain and left flank pain.  Patient states that his chest pain started in the early morning, which is located in the left side of chest, 9 out of 10 in severity, pressure-like, radiating to the left arm.  Patient does not have shortness breath, cough, fever or chills.  Patient developed left flank pain in the emergency room, which is constant, sharp, severe, nonradiating.  No hematuria.  No symptoms of UTI.  Patient does not have nausea, vomiting or diarrhea. Patient states that he has chronic dark stool which he attributes to iron supplement use.  No bright red rectal bleeding.  No recent fall or injury.  Patient is taking Eliquis consistently.  Last dose of Eliquis was this morning.  Data reviewed independently and ED Course: pt was found to have trop 29 --> 30, BNP 413, potassium 3.4, renal function close to baseline, temperature normal, blood pressure 121/62, heart rate 84, RR 20, oxygen saturation 100% on room air.  Chest x-ray showed cardiomegaly, vascular congestion without obvious pulmonary edema.  Patient is admitted to PCU as inpatient.  Dr. Garen Lah of cardiology is consulted.   EKG: I have personally reviewed.  EKG showed sinus rhythm, QTc 500, bigeminy, RAD, widening QRS wave, poor R wave progression.  The repeated EKG showed a  flutter/A-fib.  CT-abd/pelvis: Mild inflammatory stranding is noted around the pancreatic head and body concerning for acute pancreatitis. No pseudocyst formation is noted.   Severe bilateral renal atrophy is noted consistent with history of end-stage renal disease. Right lower quadrant renal transplant is noted without hydronephrosis.   However, there is a 9.5 x 7.9 cm ill-defined area of fat stranding in the left pararenal space inferior and lateral to the left kidney most consistent with focal inflammation or infarction. No definite fluid collection is seen at this time.   Moderate splenomegaly is noted.   Aortic Atherosclerosis (ICD10-I70.0).   Review of Systems:   General: no fevers, chills, no body weight gain, has fatigue HEENT: no blurry vision, hearing changes or sore throat Respiratory: no dyspnea, coughing, wheezing CV: has chest pain, no palpitations GI: no nausea, vomiting, abdominal pain, diarrhea, constipation GU: no dysuria, burning on urination, increased urinary frequency, hematuria  Ext: has leg edema Neuro: no unilateral weakness, numbness, or tingling, no vision change or hearing loss Skin: no rash, no skin tear. MSK: No muscle spasm, no deformity, no limitation of range of movement in spin. Has left flank pain Heme: No easy bruising.  Travel history: No recent long distant travel.   Allergy:  Allergies  Allergen Reactions   Contrast Media [Iodinated Contrast Media] Other (See Comments)    Kidney transplant   Iodine Other (See Comments)    Other reaction(s): Other (See Comments) Kidney transplant   Ibuprofen Other (See Comments)    Due to kidney transplant Due to kidney transplant  Due to kidney transplant    Past Medical History:  Diagnosis Date   Asthma, persistent controlled 02/25/2013   Attention or concentration deficit 04/26/2021   BPH with obstruction/lower urinary tract symptoms 09/25/2014   CAD (coronary artery disease) 2005   a.   Status post four-vessel CABG in 2005; b. 03/2018 MV: Small, fixed inferoapical and apical septal defect. No ischemia. EF 47% (60-65% by 05/2018 Echo).   Cellulitis and abscess of left leg 07/22/2020   Chronic atrial fibrillation    a. CHADS2VASc 4 (CHF, HTN, DM, vascular disease)--> Eliquis; b. 09/2018 Zio: Chronic Afib.   Chronic combined systolic and diastolic CHF (congestive heart failure)    a.  7/18 Echo: EF 45-50%; b. 05/2018 Echo: EF 60-65%, RVSP 74.8; c. 12/2018 Echo: EF 50-55%. Sev dil LA; d. 04/2019 Echo: EF 45-50%, glob HK. Mod reduced RV fxn. RVSP 53.77mHg.   Chronic venous stasis dermatitis of both lower extremities    Cytomegaloviral disease 2017   Diverticulosis of colon 04/22/2013   Essential hypertension 12/17/2010   GERD (gastroesophageal reflux disease)    Gout attack 03/30/2018   History of renal transplant 03/01/2013   Hyperlipidemia    low since renal failure   Hypogonadism in male 09/25/2014   Major depressive disorder 10/04/2013   Obesity hypoventilation syndrome 03/31/2012   Obstructive sleep apnea 09/29/2012   NPSG [place] 03/15/12- AHI 57.9/ hr, desaturation to 78.8%, body weight 310 lbs   PAH (pulmonary artery hypertension)    a. 05/2018 Echo: RVSP 74.860mg; b. 12/2018 RV not well visualized; c. 04/2019 RHC: RA 19, RV 80/20, PA 78/31 (51), PCWP 25, CO/CI 7.69/3.26. PVR 3.25 WU-->Sev PAH, likely primarily PV HTN.   Synovitis of finger 06/11/2019   Trigger finger, unspecified little finger 12/23/2018   Type 2 diabetes mellitus with hyperglycemia, with long-term current use of insulin 09/09/2014   Ulcer 05/2016   Left shin   Wears glasses     Past Surgical History:  Procedure Laterality Date   BARIATRIC [place]02/10/2019   CATARACT EXTRACTION W/PHACO Right 10/29/2017   Procedure: CATARACT EXTRACTION PHACO AND INTRAOCULAR LENS PLACEMENT ([place] RIGHT DIABETIC;  Surgeon: BrLeandrew KoyanagiMD;   [place] [facility] Service: Ophthal[facility];  Laterality: Right;  Diabetic - insulin   CATARACT EXTRACTION W/PHACO Left 11/18/2017   Procedure: CATARACT EXTRACTION PHACO AND INTRAOCULAR LENS PLACEMENT ([place] IVA/TOPICAL;  Surgeon: BrLeandrew KoyanagiMD;  [place] [facility] [place] [facility];  Laterality: Left;  DIABETES - insulin sleep apnea   COLONOSCOPY WITH PROPOFOL N/A 04/22/2013   Procedure: COLONOSCOPY WITH PROPOFOL;  Surgeon: DaMilus BanisterMD;  [facility];  Service: Endoscopy;  Laterality: N/A;   CORONARY ARTERY BYPASS GRAFT  2005   X 4   DG ANGIO AV SHUNT*L*     right and left upper arms   FASCIOTOMY  03/03/2012   Procedure: FASCIOTOMY;  Surgeon: GaWynonia SoursMD;  [place]: [facility];  Laterality: Right;  FASCIOTOMY RIGHT SMALL FINGER   FASCIOTOMY Left 08/17/2013   Procedure: FASCIOTOMY LEFT RING;  Surgeon: GaWynonia SoursMD;  [place] [facility]: Orthopedics;  Laterality: Left;   INCISION AND DRAINAGE ABSCESS Left 10/15/2015   Procedure: INCISION AND DRAINAGE ABSCESS;  Surgeon: DiJules HusbandsMD;  Location: [facility];  Service: General;  Laterality: Left;   KIDNEY TRANSPLANT  09/13/2010  [facility]ver--at [facility] HEART CATH N/A 11/15/2016   Procedure: RIGHT HEART CATH;  Surgeon: [name], MD;  Location: [place] [facility];  Service: Cardiovascular;  Laterality: N/A;   RIGHT HEART CATH N/A 05/03/2019   Procedure: RIGHT HEART CATH;  Surgeon: [name], MD;  Location: [place] [facility];  Service: Cardiovascular;  Laterality: N/A;   TYMPANIC MEMBRANE REPAIR  1/12   left   VASECTOMY      Social History:  reports that he quit smoking about 27 years ago. His smoking use included cigars. He has never been exposed to tobacco smoke. He has never used smokeless tobacco. He reports current alcohol use. He reports that he does not use drugs.  Family History:   Family History  Problem Relation Age of Onset   Heart disease Father    Kidney failure Father    Kidney disease Father    Diabetes Maternal Grandmother    Breast cancer Maternal Grandmother    Valvular heart disease Mother    Liver cancer Paternal Uncle    Liver cancer Paternal Grandmother    Prostate cancer Neg Hx      Prior to Admission medications   Medication Sig Start Date End Date Taking? Authorizing Provider  albuterol (PROVENTIL) (2.5 MG/3ML) 0.083% nebulizer solution Take 3 mLs (2.5 mg total) by nebulization every 6 (six) hours as needed for wheezing or shortness of breath. 01/02/21   Michela Pitcher, NP  albuterol (VENTOLIN HFA) 108 (90 Base) MCG/ACT inhaler Inhale 2 puffs into the lungs every 6 (six) hours as needed for wheezing or shortness of breath. 10/17/20   Martyn Ehrich, NP  allopurinol (ZYLOPRIM) 100 MG tablet Take 100 mg by mouth daily.    [provider]  amoxicillin (AMOXIL) 500 MG capsule Take 4 capsules (2,000 mg total) by mouth as needed (Take 1 hr before dental appointment.). Patient not taking: Reported on 10/31/2021 07/06/21     apixaban (ELIQUIS) 5 MG TABS tablet TAKE 1 TABLET BY MOUTH TWICE DAILY 08/21/21 08/21/22  [name], MD  beclomethasone (QVAR) 80 MCG/ACT inhaler Inhale 2 puffs into the lungs 2 (two) times daily. 10/17/20   Martyn Ehrich, NP  buPROPion [facility] SR) 150 MG 12 hr tablet TAKE 1 TABLET BY MOUTH TWICE DAILY 05/29/21 05/29/22  Dutch Quint B, FNP  cholecalciferol (VITAMIN D3) 25 MCG (1000 UNIT) tablet Take 1,000 Units by mouth at bedtime.    [provider]  colchicine 0.6 MG tablet Take 1 tablet (0.6 mg total) by mouth every other day 10/22/21     Continuous Blood Gluc Sensor (FREESTYLE LIBRE 3 SENSOR) MISC Use one every 2 weeks. 05/28/21     dapagliflozin propanediol (FARXIGA) 10 MG TABS tablet Take 1 tablet (10 mg total) by mouth daily before breakfast. 08/23/21   Milford, Maricela Bo, FNP  Febuxostat 80 MG TABS Take  1 tablet (80 mg total) by mouth once daily for 180 days 10/22/21     fluticasone (FLONASE) 50 MCG/ACT nasal spray PLACE 2 SPRAYS INTO BOTH NOSTRILS DAILY. 08/03/21 08/03/22  [name], MD  gabapentin (NEURONTIN) 300 MG capsule TAKE 1 CAPSULE BY MOUTH TWICE DAILY 05/22/21     hydrALAZINE (APRESOLINE) 25 MG tablet Take 1/2 tablet (12.5 mg total) by mouth every 8 (eight) hours. 10/01/21 10/31/21  [name], MD  hydrocortisone 2.5 % cream Apply topically 3 (three) times daily as needed. 10/30/21   [name], MD  insulin glargine-yfgn [facility])  100 UNIT/ML Pen Inject 40 Units subcutaneously once daily Patient taking differently: Inject 40 Units into the skin at bedtime. 05/28/21     insulin lispro (HUMALOG KWIKPEN) 100 UNIT/ML KwikPen INJECT UP TO 50 UNITS UNDER THE SKIN DAILY AS DIRECTED 10/10/21     Insulin Pen Needle (UNIFINE PENTIPS) 31G X 5 MM MISC Use 4 times daily as directed 05/28/21     iron polysaccharides (FERREX 150) 150 MG capsule TAKE 1 CAPSULE BY MOUTH 2 TIMES DAILY. 06/22/21   [name], MD  metolazone (ZAROXOLYN) 2.5 MG tablet Take 1 tablet by mouth 2 days per week 08/01/21   Rafael Bihari, FNP  montelukast (SINGULAIR) 10 MG tablet TAKE 1 TABLET BY MOUTH AT BEDTIME. 10/17/20 10/31/21  Martyn Ehrich, NP  Multiple Vitamin (MULTIVITAMIN WITH MINERALS) TABS tablet Take 1 tablet by mouth daily.    [provider]  mycophenolate (MYFORTIC) 180 MG EC tablet Take 360 mg by mouth 2 (two) times daily.    [provider]  omeprazole (PRILOSEC) 20 MG capsule TAKE 1 CAPSULE BY MOUTH DAILY. Patient taking differently: Take 20 mg by mouth at bedtime. 12/28/20 01/07/22  [name], MD  Potassium Chloride CR (MICRO-K) 8 MEQ CPCR capsule CR Take 10 capsules (80 mEq total) by mouth 2 (two) times daily. 09/18/21   [name], MD  predniSONE (DELTASONE) 5 MG tablet Take 1 tablet (5 mg total) by mouth daily. 10/24/21     rosuvastatin (CRESTOR) 10 MG tablet  Take 1 tablet (10 mg total) by mouth daily. 07/25/21   [name], MD  sulfamethoxazole-trimethoprim (BACTRIM) 400-80 MG tablet TAKE 1 TABLET BY MOUTH EVERY MONDAY, [place], FRIDAY. 09/21/21     tacrolimus (PROGRAF) 1 MG capsule Take 2 capsules (2 mg total) by mouth 2 times daily. 04/30/21     tadalafil, PAH, (ADCIRCA) 20 MG tablet TAKE 1 TABLET (20 MG TOTAL) BY MOUTH EVERY OTHER DAY. 10/01/21 10/01/22  [name], MD  tamsulosin (FLOMAX) 0.4 MG CAPS capsule TAKE 1 CAPSULE BY MOUTH DAILY. 05/28/21     torsemide (DEMADEX) 100 MG tablet Take 1 tablet (100 mg total) by mouth 2 (two) times daily. 08/23/21   Milford, Maricela Bo, FNP  zinc sulfate 220 (50 Zn) MG capsule Take 220 mg by mouth daily.    [provider]    Physical Exam: Vitals:   11/02/21 1430 11/02/21 1451 11/02/21 1500 11/02/21 1651  BP: 133/61  117/66 110/61  Pulse: 87  (!) 42 90  Resp: 18  11   Temp:  98.6 F (37 C)    TempSrc:  Oral    SpO2: 98%  93%   Weight:      Height:       General: Not in acute distress HEENT:       Eyes: PERRL, EOMI, no scleral icterus.       ENT: No discharge from the ears and nose, no pharynx injection, no tonsillar enlargement.        Neck: No JVD, no bruit, no mass felt. Heme: No neck lymph node enlargement. Cardiac: S1/S2, RRR, No murmurs, No gallops or rubs. Respiratory: No rales, wheezing, rhonchi or rubs. GI: Soft, nondistended, nontender, no rebound pain, no organomegaly, BS present. GU: no hematuria Ext: has trace leg edema bilaterally.  Has chronic venous insufficiency change in both legs.  1+DP/PT pulse bilaterally. Musculoskeletal: No joint deformities, No joint redness or warmth, no limitation of ROM in spin. Has tenderness in left flank  area Skin: No rashes.  Neuro: Alert, oriented X3, cranial nerves II-XII grossly intact, moves all extremities normally.  Psych: Patient is not psychotic, no suicidal or hemocidal ideation.  Labs on Admission: I have personally  reviewed following labs and imaging studies  CBC: Recent Labs  Lab 11/02/21 1118 11/02/21 1810  WBC 5.4 4.0  HGB 8.4* 8.6*  HCT 28.3* 28.1*  MCV 83.2 82.2  PLT 141* 242*   Basic Metabolic Panel: Recent Labs  Lab 11/02/21 1118  NA 141  K 3.4*  CL 100  CO2 31  GLUCOSE 115*  BUN 59*  CREATININE 2.40*  CALCIUM 8.6*  MG 2.1   GFR: Estimated Creatinine Clearance: 40.2 mL/min (A) (by C-G formula based on SCr of 2.4 mg/dL (H)). Liver Function Tests: Recent Labs  Lab 11/02/21 1118  AST 18  ALT 9  ALKPHOS 121  BILITOT 0.9  PROT 6.8  ALBUMIN 3.4*   Recent Labs  Lab 11/02/21 1810  LIPASE 27   No results for input(s): "AMMONIA" in the last 168 hours. Coagulation Profile: Recent Labs  Lab 11/02/21 1339  INR 2.2*   Cardiac Enzymes: No results for input(s): "CKTOTAL", "CKMB", "CKMBINDEX", "TROPONINI" in the last 168 hours. BNP (last 3 results) No results for input(s): "PROBNP" in the last 8760 hours. HbA1C: No results for input(s): "HGBA1C" in the last 72 hours. CBG: Recent Labs  Lab 11/02/21 1700  GLUCAP 131*   Lipid Profile: Recent Labs    11/02/21 1118  CHOL 68  HDL 31*  LDLCALC 22  TRIG 74  CHOLHDL 2.2   Thyroid Function Tests: No results for input(s): "TSH", "T4TOTAL", "FREET4", "T3FREE", "THYROIDAB" in the last 72 hours. Anemia Panel: Recent Labs    11/02/21 1118 11/02/21 1418  FOLATE 10.9  --   FERRITIN 20*  --   TIBC 367  --   IRON 23*  --   RETICCTPCT  --  2.1   Urine analysis:    Component Value Date/Time   COLORURINE YELLOW (A) 11/02/2021 1435   APPEARANCEUR CLEAR (A) 11/02/2021 1435   APPEARANCEUR Clear 10/03/2021 0939   LABSPEC 1.010 11/02/2021 1435   PHURINE 5.0 11/02/2021 1435   GLUCOSEU 150 (A) 11/02/2021 1435   HGBUR NEGATIVE 11/02/2021 1435   BILIRUBINUR NEGATIVE 11/02/2021 1435   BILIRUBINUR Negative 10/03/2021 0939   KETONESUR NEGATIVE 11/02/2021 1435   PROTEINUR NEGATIVE 11/02/2021 1435   NITRITE NEGATIVE  11/02/2021 1435   LEUKOCYTESUR NEGATIVE 11/02/2021 1435   Sepsis Labs: _0 (procalcitonin:4,lacticidven:4) )No results found for this or any previous visit (from the past 240 hour(s)).   Radiological Exams on Admission: CT ABDOMEN PELVIS WO CONTRAST  Result Date: 11/02/2021 CLINICAL DATA:  Left flank pain. EXAM: CT ABDOMEN AND PELVIS WITHOUT CONTRAST TECHNIQUE: Multidetector CT imaging of the abdomen and pelvis was performed following the standard protocol without IV contrast. RADIATION DOSE REDUCTION: This exam was performed according to the departmental dose-optimization program which includes automated exposure control, adjustment of the mA and/or kV according to patient size and/or use of iterative reconstruction technique. COMPARISON:  February 22, 2003. FINDINGS: Lower chest: No acute abnormality. Hepatobiliary: No focal liver abnormality is seen. Status post cholecystectomy. No biliary dilatation. Pancreas: Mild inflammatory stranding is noted around the pancreatic head and body concerning for acute pancreatitis. No pseudocyst formation is noted. Spleen: Moderate splenomegaly is noted. Adrenals/Urinary Tract: Adrenal glands appear normal. Severe bilateral renal atrophy is noted consistent with end-stage renal disease. No hydronephrosis or renal obstruction is noted. Urinary bladder is unremarkable. Renal transplant  is noted in the right lower quadrant without definite evidence of hydronephrosis. Stomach/Bowel: Status post gastric bypass. There is no evidence of bowel obstruction or inflammation. Vascular/Lymphatic: Aortic atherosclerosis. No enlarged abdominal or pelvic lymph nodes. Reproductive: Prostate is unremarkable. Other: No definite hernia is noted. Ill-defined area peritoneal fat stranding is noted inferior and lateral to the left kidney in the left pararenal space concerning for focal inflammation. This area measures 8.5 x 7.9 cm. Musculoskeletal: No acute or significant osseous  findings. IMPRESSION: Mild inflammatory stranding is noted around the pancreatic head and body concerning for acute pancreatitis. No pseudocyst formation is noted. Severe bilateral renal atrophy is noted consistent with history of end-stage renal disease. Right lower quadrant renal transplant is noted without hydronephrosis. However, there is a 9.5 x 7.9 cm ill-defined area of fat stranding in the left pararenal space inferior and lateral to the left kidney most consistent with focal inflammation or infarction. No definite fluid collection is seen at this time. Moderate splenomegaly is noted. Aortic Atherosclerosis (ICD10-I70.0). Electronically Signed   By: Marijo Conception M.D.   On: 11/02/2021 15:50   DG Chest 2 View  Result Date: 11/02/2021 CLINICAL DATA:  Chest pain. EXAM: CHEST - 2 VIEW COMPARISON:  Chest x-ray May 24, 23. FINDINGS: Chronic enlargement the cardiac silhouette and pulmonary vascular congestion. Median sternotomy. No overt pulmonary edema. No consolidation. No visible pleural effusions or pneumothorax. Unchanged right paratracheal calcified lymph node. IMPRESSION: Chronic cardiomegaly and pulmonary vascular congestion without overt pulmonary edema. Electronically Signed   By: Margaretha Sheffield M.D.   On: 11/02/2021 12:06      Assessment/Plan Principal Problem:   Type 2 MI (myocardial infarction) ([place]) Active Problems:   CAD (coronary artery disease)   Chronic combined systolic and diastolic CHF (congestive heart failure)   Hyperlipidemia   Essential hypertension   History of renal transplant   Chronic kidney disease, stage 3b (HCC)   Asthma, persistent controlled   Chronic atrial fibrillation (HCC)   PAH (pulmonary artery hypertension) (HCC)   Hypokalemia   Type II diabetes mellitus with renal manifestations (HCC)   Normocytic anemia   Left flank pain   Pancreatitis   Depression   Obesity with body mass index of 30.0-39.9   Assessment and Plan: * Type 2 MI (myocardial  infarction) ([place]) Hx of CAD and type II MI due to multiple issues, including possible acute pancreatitis.  Troponin level 29 --> 30.  Initially started IV heparin which was discontinued per Dr. Garen Lah of cardiology  -will admitted to PCU as inpatient -As needed nitroglycerin and morphine -Continue Crestor -Noticed on aspirin since patient is on Eliquis -Trend troponin -Check A1c and FLP  CAD (coronary artery disease) -see above  Chronic combined systolic and diastolic CHF (congestive heart failure) 2D echo on 08/16/2021 showed EF of 40-45%.  Patient has trace leg edema, but has an elevated BNP 413, at high risk of developing CHF exacerbation, not yet now. -Continue home torsemide and metolazone 2.5 mg daily -Watch volume status closely  Hyperlipidemia - Crestor  Essential hypertension - IV hydralazine as needed -Patient is on torsemide, metolazone -Amlodipine, oral hydralazine  History of renal transplant - Continue home tacrolimus, mycophenolate -Increase prednisone dose from 5 to 15 unit daily due to stress -Give prednisone 50 mg as stress dose now (Solu-Cortef is in shortage)  Chronic kidney disease, stage 3b ([place]) Renal function close to baseline.  Recent baseline creatinine 1.6-2.4.  His creatinine is at 2.40, BUN 59 -Hold volume status closely  Asthma, persistent controlled Stable -Bronchodilators  Chronic atrial fibrillation (HCC) Heart rate 84 - Continue Eliquis  PAH (pulmonary artery hypertension) (HCC) - Continue home tadalafil  Hypokalemia Potassium 3.4 -Repleted potassium -Check magnesium level  Type II diabetes mellitus with renal manifestations (HCC) Recent A1c 7.6, poorly controlled.  Patient is taking Humalog, glargine insulin 40 units daily, also on [place] due to CHF -Glargine insulin 20 units daily -Slight scale insulin  Normocytic anemia Patient had hemoglobin 12.2 on 09/18/2021.  His hemoglobin 8.4, unclear why his  hemoglobin decreased.  Patient states that he has dark stool which he attributed to iron supplement use.  Denies rectal bleeding.  May be due to CKD. -Check anemia panel -Follow-up CBC every 8 hours  Pancreatitis Possible acute pancreatitis: -See above  Left flank pain Etiology is not clear.  CT scan of abdomen/pelvis showed possible acute pancreatitis, but the patient's lipase is not elevated, at 27 -Keep patient n.p.o. -As needed morphine for pain -Gentle IV fluid: 50 cc/h of normal saline  Depression - Continue home medications  Obesity with body mass index of 30.0-39.9  BMI= 37.95   and BW= 116.6 -Diet and exercise.   -Encouraged to lose weight.           DVT ppx: on IV heparin --> change to home Eliquis  Code Status: Full code  Family Communication:  Yes, patient's wife   at bed side.   Disposition Plan:  Anticipate discharge back to previous environment  Consults called:  Dr. Garen Lah of cardiology is consulted.  Admission status and Level of care: Progressive:  s as inpt          Severity of Illness:  The appropriate patient status for this patient is INPATIENT. Inpatient status is judged to be reasonable and necessary in order to provide the required intensity of service to ensure the patient's safety. The patient's presenting symptoms, physical exam findings, and initial radiographic and laboratory data in the context of their chronic comorbidities is felt to place them at high risk for further clinical deterioration. Furthermore, it is not anticipated that the patient will be medically stable for discharge from the hospital within 2 midnights of admission.   * I certify that at the point of admission it is my clinical judgment that the patient will require inpatient hospital care spanning beyond 2 midnights from the point of admission due to high intensity of service, high risk for further deterioration and high frequency of surveillance  required.*       Date of Service 11/02/2021    Ivor Costa [facility]   If 7PM-7AM, please contact night-coverage [email or web address] 11/02/2021, 7:23 PM

## 2021-11-02 NOTE — ED Notes (Signed)
Stop Heprin Per MD Blaine Hamper

## 2021-11-02 NOTE — ED Notes (Signed)
Informed RN bed assigned 

## 2021-11-02 NOTE — Assessment & Plan Note (Signed)
  BMI= 37.95   and BW= 116.6 -Diet and exercise.   -Encouraged to lose weight.

## 2021-11-02 NOTE — Assessment & Plan Note (Signed)
-  Crestor 

## 2021-11-02 NOTE — Assessment & Plan Note (Signed)
-  see above

## 2021-11-02 NOTE — Assessment & Plan Note (Signed)
Etiology is not clear.  CT scan of abdomen/pelvis showed possible acute pancreatitis, but the patient's lipase is not elevated, at 27 -Keep patient n.p.o. -As needed morphine for pain -Gentle IV fluid: 50 cc/h of normal saline

## 2021-11-02 NOTE — ED Triage Notes (Signed)
Pt brought in by [facility] for chest pain since 0800 today. Pt has hx of quadruple bypass years ago. EMS gave nitro x 2, no aspirin due to kidney function.

## 2021-11-02 NOTE — Assessment & Plan Note (Signed)
Patient had hemoglobin 12.2 on 09/18/2021.  His hemoglobin 8.4, unclear why his hemoglobin decreased.  Patient states that he has dark stool which he attributed to iron supplement use.  Denies rectal bleeding.  May be due to CKD. -Check anemia panel -Follow-up CBC every 8 hours

## 2021-11-03 DIAGNOSIS — I2510 Atherosclerotic heart disease of native coronary artery without angina pectoris: Secondary | ICD-10-CM | POA: Diagnosis not present

## 2021-11-03 DIAGNOSIS — I21A1 Myocardial infarction type 2: Secondary | ICD-10-CM | POA: Diagnosis not present

## 2021-11-03 DIAGNOSIS — I5042 Chronic combined systolic (congestive) and diastolic (congestive) heart failure: Secondary | ICD-10-CM

## 2021-11-03 DIAGNOSIS — I482 Chronic atrial fibrillation, unspecified: Secondary | ICD-10-CM | POA: Diagnosis not present

## 2021-11-03 LAB — CBC
HCT: 30.4 % — ABNORMAL LOW (ref 39.0–52.0)
HCT: 32.4 % — ABNORMAL LOW (ref 39.0–52.0)
Hemoglobin: 8.8 g/dL — ABNORMAL LOW (ref 13.0–17.0)
Hemoglobin: 9.6 g/dL — ABNORMAL LOW (ref 13.0–17.0)
MCH: 24.2 pg — ABNORMAL LOW (ref 26.0–34.0)
MCH: 24.6 pg — ABNORMAL LOW (ref 26.0–34.0)
MCHC: 28.9 g/dL — ABNORMAL LOW (ref 30.0–36.0)
MCHC: 29.6 g/dL — ABNORMAL LOW (ref 30.0–36.0)
MCV: 83.7 fL (ref 80.0–100.0)
MCV: 83.1 fL (ref 80.0–100.0)
Platelets: 117 10*3/uL — ABNORMAL LOW (ref 150–400)
Platelets: 120 10*3/uL — ABNORMAL LOW (ref 150–400)
RBC: 3.63 MIL/uL — ABNORMAL LOW (ref 4.22–5.81)
RBC: 3.9 MIL/uL — ABNORMAL LOW (ref 4.22–5.81)
RDW: 15.3 % (ref 11.5–15.5)
RDW: 15.3 % (ref 11.5–15.5)
WBC: 3.5 10*3/uL — ABNORMAL LOW (ref 4.0–10.5)
WBC: 3.6 10*3/uL — ABNORMAL LOW (ref 4.0–10.5)
nRBC: 0 % (ref 0.0–0.2)
nRBC: 0 % (ref 0.0–0.2)

## 2021-11-03 LAB — GLUCOSE, CAPILLARY
Glucose-Capillary: 133 mg/dL — ABNORMAL HIGH (ref 70–99)
Glucose-Capillary: 147 mg/dL — ABNORMAL HIGH (ref 70–99)
Glucose-Capillary: 212 mg/dL — ABNORMAL HIGH (ref 70–99)
Glucose-Capillary: 245 mg/dL — ABNORMAL HIGH (ref 70–99)
Glucose-Capillary: 291 mg/dL — ABNORMAL HIGH (ref 70–99)

## 2021-11-03 LAB — BASIC METABOLIC PANEL
Anion gap: 9 (ref 5–15)
BUN: 57 mg/dL — ABNORMAL HIGH (ref 8–23)
CO2: 30 mmol/L (ref 22–32)
Calcium: 8.8 mg/dL — ABNORMAL LOW (ref 8.9–10.3)
Chloride: 102 mmol/L (ref 98–111)
Creatinine, Ser: 2.49 mg/dL — ABNORMAL HIGH (ref 0.61–1.24)
GFR, Estimated: 28 mL/min — ABNORMAL LOW (ref >60)
Glucose, Bld: 157 mg/dL — ABNORMAL HIGH (ref 70–99)
Potassium: 4.3 mmol/L (ref 3.5–5.1)
Sodium: 141 mmol/L (ref 135–145)

## 2021-11-03 LAB — VITAMIN B12: Vitamin B-12: 737 pg/mL (ref 180–914)

## 2021-11-03 MED ORDER — LIDOCAINE 5 % EX PTCH
1.0000 | MEDICATED_PATCH | CUTANEOUS | Status: DC
Start: 2021-11-03 — End: 2021-11-04
  Administered 2021-11-03 – 2021-11-04 (×3): 1 via TRANSDERMAL
  Filled 2021-11-03 (×3): qty 1

## 2021-11-03 MED ORDER — TRAMADOL HCL 50 MG PO TABS
50.0000 mg | ORAL_TABLET | Freq: Four times a day (QID) | ORAL | Status: DC | PRN
  Administered 2021-11-03 – 2021-11-04 (×3): 50 mg via ORAL
  Filled 2021-11-03 (×3): qty 1

## 2021-11-03 NOTE — Progress Notes (Signed)
Progress Note  Patient Name: [name]. Date of Encounter: 11/03/2021  [facility] Cardiologist: [name], MD   Subjective   Feeling OK.  No chest pain.  He continues to have L flank discomfort.  Inpatient Medications    Scheduled Meds:  amLODipine  10 mg Oral Daily   apixaban  5 mg Oral BID   budesonide (PULMICORT) nebulizer solution  0.25 mg Nebulization BID   buPROPion  150 mg Oral BID   cholecalciferol  1,000 Units Oral QHS   [START ON 11/04/2021] colchicine  0.6 mg Oral QODAY   dapagliflozin propanediol  10 mg Oral QAC breakfast   febuxostat  80 mg Oral Q1400   fluticasone  2 spray Each Nare Daily   gabapentin  300 mg Oral BID   hydrALAZINE  12.5 mg Oral Q8H   insulin aspart  0-5 Units Subcutaneous QHS   insulin aspart  0-9 Units Subcutaneous TID WC   insulin glargine-yfgn  20 Units Subcutaneous Q2200   iron polysaccharides  150 mg Oral BID   [START ON 11/06/2021] metolazone  2.5 mg Oral Once per day on Tue Fri   montelukast  10 mg Oral QHS   multivitamin with minerals  1 tablet Oral Daily   mycophenolate  360 mg Oral BID   pantoprazole  40 mg Oral Daily   potassium chloride  20 mEq Oral Once   predniSONE  15 mg Oral Daily   rosuvastatin  10 mg Oral Daily   [START ON 11/05/2021] sulfamethoxazole-trimethoprim  1 tablet Oral Once per day on Mon Wed Fri   tacrolimus  2 mg Oral BID   [START ON 11/04/2021] tadalafil (PAH)  20 mg Oral QODAY   tamsulosin  0.4 mg Oral Daily   torsemide  100 mg Oral BID   zinc sulfate  220 mg Oral Daily   Continuous Infusions:  sodium chloride 50 mL/hr at 11/02/21 1830   PRN Meds: acetaminophen, albuterol, dextromethorphan-guaiFENesin, diphenhydrAMINE, hydrALAZINE, morphine injection, nitroGLYCERIN   Vital Signs    Vitals:   11/02/21 1451 11/02/21 1500 11/02/21 1651 11/02/21 2014  BP:  117/66 110/61 129/68  Pulse:  (!) 42 90 85  Resp:  11  18  Temp: 98.6 F (37 C)   97.9 F (36.6 C)  TempSrc: Oral      SpO2:  93%  (!) 88%  Weight:      Height:        Intake/Output Summary (Last 24 hours) at 11/03/2021 0550 Last data filed at 11/03/2021 0510 Gross per 24 hour  Intake 68.53 ml  Output 1000 ml  Net -931.47 ml      11/02/2021   11:07 AM 10/31/2021   12:35 PM 10/30/2021    8:15 AM  Last 3 Weights  Weight (lbs) 257 lb 255 lb 268 lb  Weight (kg) 116.574 kg 115.667 kg 121.564 kg      Telemetry    Atrial fibrillation.  Rates <100 bpm - Personally Reviewed  ECG    Atrial fibrillation.  Rate 88 bpm.  Multiple PVCs.  IVCD. - Personally Reviewed  Physical Exam   VS:  BP 129/68 (BP Location: Right Arm)   Pulse 85   Temp 97.9 F (36.6 C)   Resp 18   Ht _0  (1.753 m)   Wt 116.6 kg   SpO2 (!) 88%   BMI 37.95 kg/m  , BMI Body mass index is 37.95 kg/m. GENERAL:  Well appearing HEENT: Pupils equal round and reactive, fundi  not visualized, oral mucosa unremarkable NECK:  No jugular venous distention, waveform within normal limits, carotid upstroke brisk and symmetric, no bruits, no thyromegaly LUNGS:  Clear to auscultation bilaterally HEART:  Irregularly irregular.  PMI not displaced or sustained,S1 and S2 within normal limits, no S3, no S4, no clicks, no rubs, no murmurs ABD:  Flat, positive bowel sounds normal in frequency in pitch, no bruits, no rebound, no guarding, no midline pulsatile mass, no hepatomegaly, no splenomegaly EXT:  2 plus pulses throughout, no edema, no cyanosis no clubbing SKIN:  No rashes no nodules NEURO:  Cranial nerves II through XII grossly intact, motor grossly intact throughout PSYCH:  Cognitively intact, oriented to person place and time   Labs    High Sensitivity Troponin:   Recent Labs  Lab 11/02/21 1118 11/02/21 1339 11/02/21 1810  TROPONINIHS 29* 30* 29*     Chemistry Recent Labs  Lab 11/02/21 1118 11/03/21 0312  NA 141 141  K 3.4* 4.3  CL 100 102  CO2 31 30  GLUCOSE 115* 157*  BUN 59* 57*  CREATININE 2.40* 2.49*  CALCIUM 8.6*  8.8*  MG 2.1  --   PROT 6.8  --   ALBUMIN 3.4*  --   AST 18  --   ALT 9  --   ALKPHOS 121  --   BILITOT 0.9  --   GFRNONAA 30* 28*  ANIONGAP 10 9    Lipids  Recent Labs  Lab 11/02/21 1118  CHOL 68  TRIG 74  HDL 31*  LDLCALC 22  CHOLHDL 2.2    Hematology Recent Labs  Lab 11/02/21 1118 11/02/21 1418 11/02/21 1810 11/03/21 0312  WBC 5.4  --  4.0 3.5*  RBC 3.40* 3.45* 3.42* 3.63*  HGB 8.4*  --  8.6* 8.8*  HCT 28.3*  --  28.1* 30.4*  MCV 83.2  --  82.2 83.7  MCH 24.7*  --  25.1* 24.2*  MCHC 29.7*  --  30.6 28.9*  RDW 15.3  --  15.3 15.3  PLT 141*  --  109* 117*   Thyroid No results for input(s): "TSH", "FREET4" in the last 168 hours.  BNP Recent Labs  Lab 11/02/21 1118  BNP 413.0*    DDimer No results for input(s): "DDIMER" in the last 168 hours.   Radiology    CT ABDOMEN PELVIS WO CONTRAST  Result Date: 11/02/2021 CLINICAL DATA:  Left flank pain. EXAM: CT ABDOMEN AND PELVIS WITHOUT CONTRAST TECHNIQUE: Multidetector CT imaging of the abdomen and pelvis was performed following the standard protocol without IV contrast. RADIATION DOSE REDUCTION: This exam was performed according to the departmental dose-optimization program which includes automated exposure control, adjustment of the mA and/or kV according to patient size and/or use of iterative reconstruction technique. COMPARISON:  February 22, 2003. FINDINGS: Lower chest: No acute abnormality. Hepatobiliary: No focal liver abnormality is seen. Status post cholecystectomy. No biliary dilatation. Pancreas: Mild inflammatory stranding is noted around the pancreatic head and body concerning for acute pancreatitis. No pseudocyst formation is noted. Spleen: Moderate splenomegaly is noted. Adrenals/Urinary Tract: Adrenal glands appear normal. Severe bilateral renal atrophy is noted consistent with end-stage renal disease. No hydronephrosis or renal obstruction is noted. Urinary bladder is unremarkable. Renal transplant is  noted in the right lower quadrant without definite evidence of hydronephrosis. Stomach/Bowel: Status post gastric bypass. There is no evidence of bowel obstruction or inflammation. Vascular/Lymphatic: Aortic atherosclerosis. No enlarged abdominal or pelvic lymph nodes. Reproductive: Prostate is unremarkable. Other: No definite hernia is  noted. Ill-defined area peritoneal fat stranding is noted inferior and lateral to the left kidney in the left pararenal space concerning for focal inflammation. This area measures 8.5 x 7.9 cm. Musculoskeletal: No acute or significant osseous findings. IMPRESSION: Mild inflammatory stranding is noted around the pancreatic head and body concerning for acute pancreatitis. No pseudocyst formation is noted. Severe bilateral renal atrophy is noted consistent with history of end-stage renal disease. Right lower quadrant renal transplant is noted without hydronephrosis. However, there is a 9.5 x 7.9 cm ill-defined area of fat stranding in the left pararenal space inferior and lateral to the left kidney most consistent with focal inflammation or infarction. No definite fluid collection is seen at this time. Moderate splenomegaly is noted. Aortic Atherosclerosis (ICD10-I70.0). Electronically Signed   By: Marijo Conception M.D.   On: 11/02/2021 15:50   DG Chest 2 View  Result Date: 11/02/2021 CLINICAL DATA:  Chest pain. EXAM: CHEST - 2 VIEW COMPARISON:  Chest x-ray May 24, 23. FINDINGS: Chronic enlargement the cardiac silhouette and pulmonary vascular congestion. Median sternotomy. No overt pulmonary edema. No consolidation. No visible pleural effusions or pneumothorax. Unchanged right paratracheal calcified lymph node. IMPRESSION: Chronic cardiomegaly and pulmonary vascular congestion without overt pulmonary edema. Electronically Signed   By: Margaretha Sheffield M.D.   On: 11/02/2021 12:06    Cardiac Studies   TTE 07/2021 1. Left ventricular ejection fraction, by estimation, is 40 to 45%.  Left  ventricular ejection fraction by PLAX is 40 %. The left ventricle has  mildly decreased function. The left ventricle demonstrates global  hypokinesis. The left ventricular internal  cavity size was moderately dilated. There is moderate left ventricular  hypertrophy. Left ventricular diastolic parameters are consistent with  Grade III diastolic dysfunction (restrictive). Elevated left ventricular  end-diastolic pressure.   2. Right ventricular systolic function is mildly reduced. The right  ventricular size is normal. Tricuspid regurgitation signal is inadequate  for assessing PA pressure.   3. Left atrial size was severely dilated.   4. Right atrial size was moderately dilated.   5. A small pericardial effusion is present. The pericardial effusion is  circumferential. There is no evidence of cardiac tamponade.   6. The mitral valve is abnormal. Mild mitral valve regurgitation.  Moderate to severe mitral annular calcification.   7. The aortic valve is tricuspid. Aortic valve regurgitation is not  visualized.   8. The inferior vena cava is dilated in size with <50% respiratory  variability, suggesting right atrial pressure of 15 mmHg.    Myoview 02/2021   Findings are consistent with prior myocardial infarction. The study is low risk.   No ST deviation was noted.   LV perfusion is abnormal. There is no evidence of ischemia. There is evidence of infarction. Defect 1: There is a small defect with moderate reduction in uptake present in the apical inferior location(s) that is fixed.   Left ventricular function is abnormal. Global function is mildly reduced. End diastolic cavity size is moderately enlarged. End systolic cavity size is moderately enlarged.   Prior study available for comparison from 04/10/2018. No changes compared to prior study.   No evidence of ischemia.  Patient Profile     61 y.o. male with CAD s/p CABG, HFmfEF, ischemic cardiomyopathy, chronic atrial fibrillation,  hypertension, ESRD status post kidney transplant, currently CKD 3, OSA on BiPAP, pulmonary hypertension admitted with chest pain.  Assessment & Plan    #CAD status post CABG: #Chest pain: High-sensitivity troponin mildly elevated and  flat in a pattern not consistent with ACS.  He has not been on nitrates given that he takes tadalafil for pulmonary hypertension.  Renal dysfunction prohibits Ranexa.  Continue medical management with amlodipine, clopidogrel, and rosuvastatin.  No plan for invasive cardiac evaluation at this time.  There is some concern that his left flank pain may be due to pancreatitis, the lipase levels are normal.  CT scan of the abdomen and pelvis showed some stranding around the pancreatic head and the left kidney region.  If this work-up is unremarkable, consider repeat nuclear stress testing.  #Chronic atrial fibrillation: Patient was noted to be in atrial fibrillation on admission.  Rates are well controlled on carvedilol.  Continue Eliquis.      For questions or updates, please contact [place] Please consult [email or web address] for contact info under        Signed, Skeet Latch, MD  11/03/2021, 5:50 AM

## 2021-11-03 NOTE — Hospital Course (Addendum)
Taken from H&P.  [name]. is a 61 y.o. male with medical history significant of CAD, s/p of CABG, s/p of  kidney transplantation on immunosuppression and prednisone, hypertension, hyperlipidemia, diabetes mellitus, asthma, GERD, gout, depression, PAH, OSA, CHF with EF of 40-45%, atrial fibrillation on Eliquis, who presents with chest pain and left flank pain.   Patient states that his chest pain started in the early morning, which is located in the left side of chest, 9 out of 10 in severity, pressure-like, radiating to the left arm.  Patient does not have shortness breath, cough, fever or chills.  Patient developed left flank pain in the emergency room, which is constant, sharp, severe, nonradiating.  No hematuria.  No symptoms of UTI.  Patient does not have nausea, vomiting or diarrhea. Patient states that he has chronic dark stool which he attributes to iron supplement use.  No bright red rectal bleeding.  No recent fall or injury.  Patient is taking Eliquis consistently.  Last dose of Eliquis was this morning.   Data reviewed independently and ED Course: pt was found to have trop 29 --> 30, BNP 413, potassium 3.4, renal function close to baseline, temperature normal, blood pressure 121/62, heart rate 84, RR 20, oxygen saturation 100% on room air.  Chest x-ray showed cardiomegaly, vascular congestion without obvious pulmonary edema.     EKG: I have personally reviewed.  EKG showed sinus rhythm, QTc 500, bigeminy, RAD, widening QRS wave, poor R wave progression.  The repeated EKG showed a flutter/A-fib.   CT-abd/pelvis: Mild inflammatory stranding is noted around the pancreatic head and body concerning for acute pancreatitis. No pseudocyst formation is noted.  Severe bilateral renal atrophy is noted consistent with history of end-stage renal disease. Right lower quadrant renal transplant is noted without hydronephrosis.  However, there is a 9.5 x 7.9 cm ill-defined area of fat  stranding in the left pararenal space inferior and lateral to the left kidney most consistent with focal inflammation or infarction. No definite fluid collection is seen at this time.   Moderate splenomegaly is noted.   Aortic Atherosclerosis (ICD10-I70.0).  Patient was initially started on heparin infusion for concern of type II MI, cardiology was consulted and they discontinued IV heparin and advised to resume his home Eliquis.  Per cardiology this is less likely to be ACS.  Limited choices for medication based on his history of renal transplant and taking tadalafil for pulmonary hypertension.  They were just suggesting as needed subcu nitroglycerin.  Cardiology is recommending just continue home medications which include torsemide and metolazone.  Also concern for acute pancreatitis on CT abdomen but lipase was normal. Slight worsening of renal function with BUN of 57 and creatinine of 2.49, which appears to be close to his baseline.  Troponin 29>30>29 which is not consistent with ACS.  B12 within normal limit.  UA with some glucosuria only.  A1c of 6.1.  BNP at 413 Anemia panel consistent with anemia of chronic disease and iron deficiency, patient will continue his home iron supplement.   8/12: Patient continued to have left flank pain.  Troponin with a flat curve therefore ACS seems unlikely per cardiology.  Renal function seems stable. Lidocaine patch and tramadol ordered to see if that will help with the pain.  8/13: Patient remained stable.  No pain while resting and to experience some pain with body movements, most likely musculoskeletal pain.  He was advised to use lidocaine patch and tramadol as needed.  He is  being discharged on current medications and need to have a close follow-up with his providers.

## 2021-11-03 NOTE — Progress Notes (Signed)
Progress Note   Patient: [name]. YIF:[id number] DOB: 1960/08/19 DOA: 11/02/2021     1 DOS: the patient was seen and examined on 11/03/2021   Brief hospital course: Taken from H&P.  [name]. is a 61 y.o. male with medical history significant of CAD, s/p of CABG, s/p of  kidney transplantation on immunosuppression and prednisone, hypertension, hyperlipidemia, diabetes mellitus, asthma, GERD, gout, depression, PAH, OSA, CHF with EF of 40-45%, atrial fibrillation on Eliquis, who presents with chest pain and left flank pain.   Patient states that his chest pain started in the early morning, which is located in the left side of chest, 9 out of 10 in severity, pressure-like, radiating to the left arm.  Patient does not have shortness breath, cough, fever or chills.  Patient developed left flank pain in the emergency room, which is constant, sharp, severe, nonradiating.  No hematuria.  No symptoms of UTI.  Patient does not have nausea, vomiting or diarrhea. Patient states that he has chronic dark stool which he attributes to iron supplement use.  No bright red rectal bleeding.  No recent fall or injury.  Patient is taking Eliquis consistently.  Last dose of Eliquis was this morning.   Data reviewed independently and ED Course: pt was found to have trop 29 --> 30, BNP 413, potassium 3.4, renal function close to baseline, temperature normal, blood pressure 121/62, heart rate 84, RR 20, oxygen saturation 100% on room air.  Chest x-ray showed cardiomegaly, vascular congestion without obvious pulmonary edema.     EKG: I have personally reviewed.  EKG showed sinus rhythm, QTc 500, bigeminy, RAD, widening QRS wave, poor R wave progression.  The repeated EKG showed a flutter/A-fib.   CT-abd/pelvis: Mild inflammatory stranding is noted around the pancreatic head and body concerning for acute pancreatitis. No pseudocyst formation is noted.  Severe bilateral renal atrophy is noted  consistent with history of end-stage renal disease. Right lower quadrant renal transplant is noted without hydronephrosis.  However, there is a 9.5 x 7.9 cm ill-defined area of fat stranding in the left pararenal space inferior and lateral to the left kidney most consistent with focal inflammation or infarction. No definite fluid collection is seen at this time.   Moderate splenomegaly is noted.   Aortic Atherosclerosis (ICD10-I70.0).  Patient was initially started on heparin infusion for concern of type II MI, cardiology was consulted and they discontinued IV heparin and advised to resume his home Eliquis.  Per cardiology this is less likely to be ACS.  Limited choices for medication based on his history of renal transplant and taking tadalafil for pulmonary hypertension.  They were just suggesting as needed subcu nitroglycerin.  Cardiology is recommending just continue home medications which include torsemide and metolazone.  Also concern for acute pancreatitis on CT abdomen but lipase was normal. Slight worsening of renal function with BUN of 57 and creatinine of 2.49, which appears to be close to his baseline.  Troponin 29>30>29 which is not consistent with ACS.  B12 within normal limit.  UA with some glucosuria only.  A1c of 6.1.  BNP at 413 Anemia panel consistent with anemia of chronic disease and iron deficiency, patient will continue his home iron supplement.   8/12: Patient continued to have left flank pain.  Troponin with a flat curve therefore ACS seems unlikely per cardiology.  Renal function seems stable. Lidocaine patch and tramadol ordered to see if that will help with the pain.    Assessment  and Plan: * Type 2 MI (myocardial infarction) ([place]) Hx of CAD and cardiology was consulted with mildly positive troponin, troponin with a flat curve so ACS less likely.  No chest pain Can be angina but limited choices in terms of medication based on his history of renal transplant and  pulmonary hypertension taking tadalafil. -As needed nitroglycerin and morphine -Continue Crestor -Noticed on aspirin since patient is on Eliquis   CAD (coronary artery disease) -see above  Chronic combined systolic and diastolic CHF (congestive heart failure) 2D echo on 08/16/2021 showed EF of 40-45%.  Clinically appears euvolemic but has an elevated BNP 413, at high risk of developing CHF exacerbation. -Continue home torsemide and metolazone 2.5 mg daily -Watch volume status closely  Hyperlipidemia - Crestor  Essential hypertension - IV hydralazine as needed -Patient is on torsemide, metolazone -Amlodipine, oral hydralazine  History of renal transplant - Continue home tacrolimus, mycophenolate -Increase prednisone dose from 5 to 15 unit daily due to stress -Give prednisone 50 mg as stress dose now (Solu-Cortef is in shortage)  Chronic kidney disease, stage 3b ([place]) Renal function close to baseline.  Recent baseline creatinine 1.6-2.4.  His creatinine is at 2.40, BUN 59 -Hold volume status closely  Asthma, persistent controlled Stable -Bronchodilators  Chronic atrial fibrillation ([facility]) Heart rate well controlled - Continue Eliquis  PAH (pulmonary artery hypertension) ([facility]) - Continue home tadalafil  Hypokalemia Potassium 4.3 -Monitor and replete as needed  Type II diabetes mellitus with renal manifestations ([facility]) Recent A1c 7.6, poorly controlled.  Patient is taking Humalog, glargine insulin 40 units daily, also on [place] due to CHF -Glargine insulin 20 units daily -Slight scale insulin  Normocytic anemia Patient had hemoglobin 12.2 on 09/18/2021.  His hemoglobin 8.4, unclear why his hemoglobin decreased.  Patient states that he has dark stool which he attributed to iron supplement use.  Denies rectal bleeding.  May be due to CKD. -Check anemia panel -Follow-up CBC every 8 hours  Pancreatitis Concern of pancreatitis on CT scan but lipase  normal. -See above  Left flank pain Etiology is not clear.  CT scan of abdomen/pelvis showed possible acute pancreatitis, but the patient's lipase is not elevated, at 27 -Keep patient n.p.o. -As needed morphine for pain -Gentle IV fluid: 50 cc/h of normal saline  Depression - Continue home medications  Obesity with body mass index of 30.0-39.9  BMI= 37.95   and BW= 116.6 -Diet and exercise.   -Encouraged to lose weight.    Subjective: Patient continued to have 7 out of 10 left flank pain.  Nonradiating.  No associated nausea or vomiting.  No diarrhea.  No difficulty with urination.  Pain increased with body movements.  Physical Exam: Vitals:   11/02/21 1651 11/02/21 2014 11/03/21 0832 11/03/21 1152  BP: 110/61 129/68 (!) 142/82 (!) 146/76  Pulse: 90 85 93 93  Resp:  _0 Temp:  97.9 F (36.6 C) 97.7 F (36.5 C) 98.1 F (36.7 C)  TempSrc:      SpO2:  (!) 88% 98% 95%  Weight:      Height:       General.  Obese gentleman, in no acute distress. Pulmonary.  Lungs clear bilaterally, normal respiratory effort. CV.  Regular rate and rhythm, no JVD, rub or murmur. Abdomen.  Soft, nontender, nondistended, BS positive. CNS.  Alert and oriented .  No focal neurologic deficit. Extremities.  No edema, no cyanosis, pulses intact and symmetrical. Psychiatry.  Judgment and insight appears normal.  Data Reviewed: Prior data reviewed.  Family Communication: This gust with patient  Disposition: Status is: Inpatient Remains inpatient appropriate because: Severity of illness  Planned Discharge Destination: Home  DVT prophylaxis.  Eliquis Time spent: 42 minutes  This record has been created using Systems analyst. Errors have been sought and corrected,but may not always be located. Such creation errors do not reflect on the standard of care.   Author: Lorella Nimrod, MD 11/03/2021 3:02 PM  For on call review [email or web address].

## 2021-11-04 ENCOUNTER — Other Ambulatory Visit: Payer: Self-pay

## 2021-11-04 DIAGNOSIS — I482 Chronic atrial fibrillation, unspecified: Secondary | ICD-10-CM | POA: Diagnosis not present

## 2021-11-04 DIAGNOSIS — I21A1 Myocardial infarction type 2: Secondary | ICD-10-CM | POA: Diagnosis not present

## 2021-11-04 DIAGNOSIS — I2510 Atherosclerotic heart disease of native coronary artery without angina pectoris: Secondary | ICD-10-CM | POA: Diagnosis not present

## 2021-11-04 LAB — COMPREHENSIVE METABOLIC PANEL
ALT: 10 U/L (ref 0–44)
AST: 16 U/L (ref 15–41)
Albumin: 3.3 g/dL — ABNORMAL LOW (ref 3.5–5.0)
Alkaline Phosphatase: 108 U/L (ref 38–126)
Anion gap: 7 (ref 5–15)
BUN: 80 mg/dL — ABNORMAL HIGH (ref 8–23)
CO2: 32 mmol/L (ref 22–32)
Calcium: 8.8 mg/dL — ABNORMAL LOW (ref 8.9–10.3)
Chloride: 100 mmol/L (ref 98–111)
Creatinine, Ser: 2.74 mg/dL — ABNORMAL HIGH (ref 0.61–1.24)
GFR, Estimated: 25 mL/min — ABNORMAL LOW (ref >60)
Glucose, Bld: 226 mg/dL — ABNORMAL HIGH (ref 70–99)
Potassium: 3.9 mmol/L (ref 3.5–5.1)
Sodium: 139 mmol/L (ref 135–145)
Total Bilirubin: 0.7 mg/dL (ref 0.3–1.2)
Total Protein: 6.9 g/dL (ref 6.5–8.1)

## 2021-11-04 LAB — GLUCOSE, CAPILLARY
Glucose-Capillary: 190 mg/dL — ABNORMAL HIGH (ref 70–99)
Glucose-Capillary: 186 mg/dL — ABNORMAL HIGH (ref 70–99)

## 2021-11-04 LAB — AMYLASE: Amylase: 23 U/L — ABNORMAL LOW (ref 28–100)

## 2021-11-04 LAB — LIPASE, BLOOD: Lipase: 39 U/L (ref 11–51)

## 2021-11-04 MED ORDER — INSULIN GLARGINE-YFGN 100 UNIT/ML ~~LOC~~ SOLN
15.0000 [IU] | Freq: Two times a day (BID) | SUBCUTANEOUS | Status: DC
  Administered 2021-11-04: 15 [IU] via SUBCUTANEOUS
  Filled 2021-11-04 (×2): qty 0.15

## 2021-11-04 MED ORDER — TRAMADOL HCL 50 MG PO TABS
50.0000 mg | ORAL_TABLET | Freq: Four times a day (QID) | ORAL | 0 refills | Status: DC | PRN
  Filled 2021-11-04: qty 30, 8d supply, fill #0

## 2021-11-04 MED ORDER — LIDOCAINE 5 % EX PTCH
1.0000 | MEDICATED_PATCH | CUTANEOUS | 0 refills | Status: DC
  Filled 2021-11-04: qty 30, 30d supply, fill #0

## 2021-11-04 NOTE — TOC Initial Note (Signed)
Transition of Care (TOC) - Initial/Assessment Note    Patient Details  Name: [name]. MRN: [id number] Date of Birth: [birth year]  Transition of Care [facility]) CM/SW Contact:    Conception Oms, RN Phone Number: 11/04/2021, 11:44 AM  Clinical Narrative:                   Transition of Care [facility]) Screening Note   Patient Details  Name: [name]. Date of Birth: 09-23-1960   Transition of Care [facility]) CM/SW Contact:    Conception Oms, RN Phone Number: 11/04/2021, 11:44 AM    Transition of Care Department [facility]) has reviewed patient and no TOC needs have been identified at this time. We will continue to monitor patient advancement through interdisciplinary progression rounds. If new patient transition needs arise, please place a TOC consult.         Patient Goals and CMS Choice        Expected Discharge Plan and Services           Expected Discharge Date: 11/03/21                                    Prior Living Arrangements/Services                       Activities of Daily Living      Permission Sought/Granted                  Emotional Assessment              Admission diagnosis:  Unstable angina ([place]) [I20.0] NSTEMI (non-ST elevated myocardial infarction) ([place]) [I21.4] Chest pain [R07.9] Patient Active Problem List   Diagnosis Date Noted   Chest pain 11/02/2021   Type 2 MI (myocardial infarction) ([place]) 11/02/2021   Left flank pain 11/02/2021   Depression 11/02/2021   Pancreatitis 11/02/2021   Chronic atrial fibrillation ([place])    PAH (pulmonary artery hypertension) ([place])    Type II diabetes mellitus with renal manifestations ([place])    Obesity with body mass index of 30.0-39.9    Normocytic anemia    Preventative health care 10/30/2021   Mood disorder ([place]) 10/30/2021   Morbid obesity ([place]) 10/30/2021   Chronic kidney disease, stage 3b ([place]) 08/15/2021   GERD (gastroesophageal reflux disease)  04/26/2021   Attention or concentration deficit 04/26/2021   Erectile dysfunction due to arterial insufficiency 10/20/2019   Pulmonary hypertension due to left heart disease 01/20/2019   Superficial thrombophlebitis 12/23/2018   Chronic combined systolic and diastolic CHF (congestive heart failure) 11/19/2016   Chronic venous insufficiency 06/05/2016   Proteinuria 06/23/2015   Hypokalemia 10/13/2014   BPH with obstruction/lower urinary tract symptoms 09/25/2014   Hypogonadism in male 09/25/2014   Immunosuppressed status ([place]) 09/09/2014   Type 2 diabetes mellitus with hyperglycemia, with long-term current use of insulin 09/09/2014   Paroxysmal atrial fibrillation ([place]) 07/18/2014   Benign neoplasm of colon 04/22/2013   Diverticulosis of colon 04/22/2013   History of renal transplant 03/01/2013   Asthma, persistent controlled 02/25/2013   Obstructive sleep apnea 09/29/2012   Nocturnal hypoxemia 09/24/2012   Obesity hypoventilation syndrome ([place]) 03/31/2012   Essential hypertension 12/17/2010   Hyperlipidemia    CAD (coronary artery disease) 2005   PCP:  [name], MD Pharmacy:   [facility] [place]  [place] [address] Phone: [phone] Fax: [facility] [address] [place] [address] Phone: [phone] Fax: [phone]     Social Determinants of Health (SDOH) Interventions    Readmission Risk Interventions    11/04/2021   11:44 AM 05/15/2020    2:49 PM 07/15/2019    1:23 PM  Readmission Risk Prevention Plan  Transportation Screening Complete Complete Complete  PCP or Specialist Appt within 3-5 Days  Complete   HRI or [place]  Complete   Social Work Consult for [place] Planning/Counseling  Complete   Palliative Care Screening  Not Applicable   Medication Review Press photographer) Complete Complete Complete  PCP or Specialist appointment within 3-5 days  of discharge Complete  Complete  HRI or [place] Complete    SW Recovery Care/Counseling Consult Complete  Complete  Palliative Care Screening Not Applicable  Not [place] Not Applicable  Not Applicable

## 2021-11-04 NOTE — Discharge Summary (Signed)
Physician Discharge Summary   Patient: [name]. MRN: [id number] DOB: [birth year]  Admit date:     11/02/2021  Discharge date: 11/04/21  Discharge Physician: Lorella Nimrod   PCP: [name], MD   Recommendations at discharge:  Please obtain renal function within a week. Follow-up with your nephrologist Follow-up with your cardiologist and primary care provider  Discharge Diagnoses: Principal Problem:   Type 2 MI (myocardial infarction) ([place]) Active Problems:   CAD (coronary artery disease)   Chronic combined systolic and diastolic CHF (congestive heart failure)   Hyperlipidemia   Essential hypertension   History of renal transplant   Chronic kidney disease, stage 3b (HCC)   Asthma, persistent controlled   Chronic atrial fibrillation ([place])   PAH (pulmonary artery hypertension) ([place])   Hypokalemia   Type II diabetes mellitus with renal manifestations (HCC)   Normocytic anemia   Left flank pain   Pancreatitis   Depression   Obesity with body mass index of 30.0-39.9   Hospital Course: Taken from H&P.  [name]. is a 61 y.o. male with medical history significant of CAD, s/p of CABG, s/p of  kidney transplantation on immunosuppression and prednisone, hypertension, hyperlipidemia, diabetes mellitus, asthma, GERD, gout, depression, PAH, OSA, CHF with EF of 40-45%, atrial fibrillation on Eliquis, who presents with chest pain and left flank pain.   Patient states that his chest pain started in the early morning, which is located in the left side of chest, 9 out of 10 in severity, pressure-like, radiating to the left arm.  Patient does not have shortness breath, cough, fever or chills.  Patient developed left flank pain in the emergency room, which is constant, sharp, severe, nonradiating.  No hematuria.  No symptoms of UTI.  Patient does not have nausea, vomiting or diarrhea. Patient states that he has chronic dark stool which he attributes to iron  supplement use.  No bright red rectal bleeding.  No recent fall or injury.  Patient is taking Eliquis consistently.  Last dose of Eliquis was this morning.   Data reviewed independently and ED Course: pt was found to have trop 29 --> 30, BNP 413, potassium 3.4, renal function close to baseline, temperature normal, blood pressure 121/62, heart rate 84, RR 20, oxygen saturation 100% on room air.  Chest x-ray showed cardiomegaly, vascular congestion without obvious pulmonary edema.     EKG: I have personally reviewed.  EKG showed sinus rhythm, QTc 500, bigeminy, RAD, widening QRS wave, poor R wave progression.  The repeated EKG showed a flutter/A-fib.   CT-abd/pelvis: Mild inflammatory stranding is noted around the pancreatic head and body concerning for acute pancreatitis. No pseudocyst formation is noted.  Severe bilateral renal atrophy is noted consistent with history of end-stage renal disease. Right lower quadrant renal transplant is noted without hydronephrosis.  However, there is a 9.5 x 7.9 cm ill-defined area of fat stranding in the left pararenal space inferior and lateral to the left kidney most consistent with focal inflammation or infarction. No definite fluid collection is seen at this time.   Moderate splenomegaly is noted.   Aortic Atherosclerosis (ICD10-I70.0).  Patient was initially started on heparin infusion for concern of type II MI, cardiology was consulted and they discontinued IV heparin and advised to resume his home Eliquis.  Per cardiology this is less likely to be ACS.  Limited choices for medication based on his history of renal transplant and taking tadalafil for pulmonary hypertension.  They were just suggesting  as needed subcu nitroglycerin.  Cardiology is recommending just continue home medications which include torsemide and metolazone.  Also concern for acute pancreatitis on CT abdomen but lipase was normal. Slight worsening of renal function with BUN of 57  and creatinine of 2.49, which appears to be close to his baseline.  Troponin 29>30>29 which is not consistent with ACS.  B12 within normal limit.  UA with some glucosuria only.  A1c of 6.1.  BNP at 413 Anemia panel consistent with anemia of chronic disease and iron deficiency, patient will continue his home iron supplement.   8/12: Patient continued to have left flank pain.  Troponin with a flat curve therefore ACS seems unlikely per cardiology.  Renal function seems stable. Lidocaine patch and tramadol ordered to see if that will help with the pain.  8/13: Patient remained stable.  No pain while resting and to experience some pain with body movements, most likely musculoskeletal pain.  He was advised to use lidocaine patch and tramadol as needed.  He is being discharged on current medications and need to have a close follow-up with his providers.  Assessment and Plan: * Type 2 MI (myocardial infarction) ([place]) Hx of CAD and cardiology was consulted with mildly positive troponin, troponin with a flat curve so ACS less likely.  No chest pain Can be angina but limited choices in terms of medication based on his history of renal transplant and pulmonary hypertension taking tadalafil. -As needed nitroglycerin and morphine -Continue Crestor -Noticed on aspirin since patient is on Eliquis   CAD (coronary artery disease) -see above  Chronic combined systolic and diastolic CHF (congestive heart failure) 2D echo on 08/16/2021 showed EF of 40-45%.  Clinically appears euvolemic but has an elevated BNP 413, at high risk of developing CHF exacerbation. -Continue home torsemide and metolazone 2.5 mg daily -Watch volume status closely  Hyperlipidemia - Crestor  Essential hypertension - IV hydralazine as needed -Patient is on torsemide, metolazone -Amlodipine, oral hydralazine  History of renal transplant - Continue home tacrolimus, mycophenolate -Increase prednisone dose from 5 to 15 unit daily  due to stress -Give prednisone 50 mg as stress dose now (Solu-Cortef is in shortage)  Chronic kidney disease, stage 3b ([place]) Renal function close to baseline.  Recent baseline creatinine 1.6-2.4.  His creatinine is at 2.40, BUN 59 -Hold volume status closely  Asthma, persistent controlled Stable -Bronchodilators  Chronic atrial fibrillation (HCC) Heart rate well controlled - Continue Eliquis  PAH (pulmonary artery hypertension) (HCC) - Continue home tadalafil  Hypokalemia Potassium 4.3 -Monitor and replete as needed  Type II diabetes mellitus with renal manifestations (HCC) Recent A1c 7.6, poorly controlled.  Patient is taking Humalog, glargine insulin 40 units daily, also on [place] due to CHF -Glargine insulin 20 units daily -Slight scale insulin  Normocytic anemia Patient had hemoglobin 12.2 on 09/18/2021.  His hemoglobin 8.4, unclear why his hemoglobin decreased.  Patient states that he has dark stool which he attributed to iron supplement use.  Denies rectal bleeding.  May be due to CKD. -Check anemia panel -Follow-up CBC every 8 hours  Pancreatitis Concern of pancreatitis on CT scan but lipase normal. -See above  Left flank pain Etiology is not clear.  CT scan of abdomen/pelvis showed possible acute pancreatitis, but the patient's lipase is not elevated, at 27 -Keep patient n.p.o. -As needed morphine for pain -Gentle IV fluid: 50 cc/h of normal saline  Depression - Continue home medications  Obesity with body mass index of 30.0-39.9  BMI= 37.95   and BW= 116.6 -Diet and exercise.   -Encouraged to lose weight.    Consultants: Cardiology Procedures performed: None Disposition: Home Diet recommendation:  Discharge Diet Orders (From admission, onward)     Start     Ordered   11/04/21 0000  Diet - low sodium heart healthy        11/04/21 1139           Cardiac and Carb modified diet DISCHARGE MEDICATION: Allergies as of  11/04/2021       Reactions   Contrast Media [iodinated Contrast Media] Other (See Comments)   Kidney transplant   Iodine Other (See Comments)   Other reaction(s): Other (See Comments) Kidney transplant   Ibuprofen Other (See Comments)   Due to kidney transplant Due to kidney transplant Due to kidney transplant        Medication List     STOP taking these medications    amoxicillin 500 MG capsule Commonly known as: AMOXIL       TAKE these medications    albuterol 108 (90 Base) MCG/ACT inhaler Commonly known as: VENTOLIN HFA Inhale 2 puffs into the lungs every 6 (six) hours as needed for wheezing or shortness of breath.   albuterol (2.5 MG/3ML) 0.083% nebulizer solution Commonly known as: PROVENTIL Inhale one vial (3 ml) via nebulizer every 6 hours as needed for wheezing or shortness of breath (Take 3 mLs (2.5 mg total) by nebulization every 6 (six) hours as needed for wheezing or shortness of breath.)   allopurinol 100 MG tablet Commonly known as: ZYLOPRIM Take 100 mg by mouth daily.   amLODipine 10 MG tablet Commonly known as: NORVASC Take 10 mg by mouth daily.   beclomethasone 80 MCG/ACT inhaler Commonly known as: QVAR Inhale 2 puffs into the lungs 2 (two) times daily.   buPROPion 150 MG 12 hr tablet Commonly known as: WELLBUTRIN SR TAKE 1 TABLET BY MOUTH TWICE DAILY   cholecalciferol 25 MCG (1000 UNIT) tablet Commonly known as: VITAMIN D3 Take 1,000 Units by mouth at bedtime.   colchicine 0.6 MG tablet Take 1 tablet (0.6 mg total) by mouth every other day   dapagliflozin propanediol 10 MG Tabs tablet Commonly known as: Farxiga Take 1 tablet (10 mg total) by mouth daily before breakfast.   Eliquis 5 MG Tabs tablet Generic drug: apixaban TAKE 1 TABLET BY MOUTH TWICE DAILY   Febuxostat 80 MG Tabs Take one tablet by mouth once daily (Take 1 tablet (80 mg total) by mouth once daily for 180 days)   Ferrex 150 150 MG capsule Generic drug: iron  polysaccharides TAKE 1 CAPSULE BY MOUTH 2 TIMES DAILY.   fluticasone 50 MCG/ACT nasal spray Commonly known as: FLONASE PLACE 2 SPRAYS INTO BOTH NOSTRILS DAILY.   FreeStyle Libre 3 Sensor Misc Use one every 2 weeks.   gabapentin 300 MG capsule Commonly known as: NEURONTIN TAKE 1 CAPSULE BY MOUTH TWICE DAILY   HumaLOG KwikPen 100 UNIT/ML KwikPen Generic drug: insulin lispro INJECT UP TO 50 UNITS UNDER THE SKIN DAILY AS DIRECTED (INJECT UP TO 50 UNITS UNDER THE SKIN DAILY AS DIRECTED)   hydrALAZINE 25 MG tablet Commonly known as: APRESOLINE Take 1/2 tablet (12.5 mg total) by mouth every 8 (eight) hours.   hydrocortisone 2.5 % cream Apply topically 3 (three) times daily as needed.   lidocaine 5 % Commonly known as: LIDODERM Place 1 patch onto the skin daily. Remove & Discard patch within 12 hours or as directed by  MD   metolazone 2.5 MG tablet Commonly known as: ZAROXOLYN Take 1 tablet by mouth 2 days per week   montelukast 10 MG tablet Commonly known as: SINGULAIR TAKE 1 TABLET BY MOUTH AT BEDTIME.   multivitamin with minerals Tabs tablet Take 1 tablet by mouth daily.   mycophenolate 180 MG EC tablet Commonly known as: MYFORTIC Take 360 mg by mouth 2 (two) times daily.   omeprazole 20 MG capsule Commonly known as: PRILOSEC TAKE 1 CAPSULE BY MOUTH DAILY. What changed:  how much to take when to take this   Potassium Chloride CR 8 MEQ Cpcr capsule CR Commonly known as: MICRO-K Take 10 capsules (80 mEq total) by mouth 2 (two) times daily.   predniSONE 5 MG tablet Commonly known as: DELTASONE Take 1 tablet (5 mg total) by mouth daily.   rosuvastatin 10 MG tablet Commonly known as: CRESTOR Take 1 tablet (10 mg total) by mouth daily.   Semglee (yfgn) 100 UNIT/ML Pen Generic drug: insulin glargine-yfgn Inject 40 Units subcutaneously once daily What changed:  how much to take when to take this   sulfamethoxazole-trimethoprim 400-80 MG tablet Commonly known  as: BACTRIM TAKE 1 TABLET BY MOUTH EVERY MONDAY, WEDNESDAY, FRIDAY.   tacrolimus 1 MG capsule Commonly known as: PROGRAF Take 2 capsules (2 mg total) by mouth 2 times daily.   tadalafil (PAH) 20 MG tablet Commonly known as: ADCIRCA TAKE 1 TABLET (20 MG TOTAL) BY MOUTH EVERY OTHER DAY.   tamsulosin 0.4 MG Caps capsule Commonly known as: FLOMAX TAKE 1 CAPSULE BY MOUTH DAILY.   torsemide 100 MG tablet Commonly known as: DEMADEX Take 1 tablet (100 mg total) by mouth 2 (two) times daily.   traMADol 50 MG tablet Commonly known as: ULTRAM Take 1 tablet (50 mg total) by mouth every 6 (six) hours as needed for moderate pain or severe pain.   Unifine Pentips 31G X 5 MM Misc Generic drug: Insulin Pen Needle Use 4 times daily as directed   zinc sulfate 220 (50 Zn) MG capsule Take 220 mg by mouth daily.        Follow-up Information     [name], MD. Schedule an appointment as soon as possible for a visit in 1 week(s).   Specialties: Internal Medicine, Pediatrics Contact information: [place] [address] [phone]         [name], MD .   Specialty: Cardiology Contact information: [place] [address] [phone]         [name], MD .   Specialty: Cardiology Contact information: [place] [address] [phone]                Discharge Exam: Danley Danker Weights   11/02/21 1107 11/04/21 0500 11/04/21 0552  Weight: 116.6 kg 124 kg 116.4 kg   General.  Obese gentleman, in no acute distress. Pulmonary.  Lungs clear bilaterally, normal respiratory effort. CV.  Regular rate and rhythm, no JVD, rub or murmur. Abdomen.  Soft, nontender, nondistended, BS positive. CNS.  Alert and oriented .  No focal neurologic deficit. Extremities.  No edema, no cyanosis, pulses intact and symmetrical. Psychiatry.  Judgment and insight appears normal.   Condition at  discharge: stable  The results of significant diagnostics from this hospitalization (including imaging, microbiology, ancillary and laboratory) are listed below for reference.   Imaging Studies: CT ABDOMEN PELVIS WO CONTRAST  Result Date: 11/02/2021 CLINICAL DATA:  Left flank pain. EXAM: CT ABDOMEN AND PELVIS WITHOUT CONTRAST TECHNIQUE: Multidetector CT imaging of the abdomen and pelvis was performed following the standard protocol without IV contrast. RADIATION DOSE REDUCTION: This exam was performed according to the departmental dose-optimization program which includes automated exposure control, adjustment of the mA and/or kV according to patient size and/or use of iterative reconstruction technique. COMPARISON:  February 22, 2003. FINDINGS: Lower chest: No acute abnormality. Hepatobiliary: No focal liver abnormality is seen. Status post cholecystectomy. No biliary dilatation. Pancreas: Mild inflammatory stranding is noted around the pancreatic head and body concerning for acute pancreatitis. No pseudocyst formation is noted. Spleen: Moderate splenomegaly is noted. Adrenals/Urinary Tract: Adrenal glands appear normal. Severe bilateral renal atrophy is noted consistent with end-stage renal disease. No hydronephrosis or renal obstruction is noted. Urinary bladder is unremarkable. Renal transplant is noted in the right lower quadrant without definite evidence of hydronephrosis. Stomach/Bowel: Status post gastric bypass. There is no evidence of bowel obstruction or inflammation. Vascular/Lymphatic: Aortic atherosclerosis. No enlarged abdominal or pelvic lymph nodes. Reproductive: Prostate is unremarkable. Other: No definite hernia is noted. Ill-defined area peritoneal fat stranding is noted inferior and lateral to the left kidney in the left pararenal space concerning for focal inflammation. This area measures 8.5 x 7.9 cm. Musculoskeletal: No acute or significant osseous findings. IMPRESSION: Mild  inflammatory stranding is noted around the pancreatic head and body concerning for acute pancreatitis. No pseudocyst formation is noted. Severe bilateral renal atrophy is noted consistent with history of end-stage renal disease. Right lower quadrant renal transplant is noted without hydronephrosis. However, there is a 9.5 x 7.9 cm ill-defined area of fat stranding in the left pararenal space inferior and lateral to the left kidney most consistent with focal inflammation or infarction. No definite fluid collection is seen at this time. Moderate splenomegaly is noted. Aortic Atherosclerosis (ICD10-I70.0). Electronically Signed   By: Marijo Conception M.D.   On: 11/02/2021 15:50   DG Chest 2 View  Result Date: 11/02/2021 CLINICAL DATA:  Chest pain. EXAM: CHEST - 2 VIEW COMPARISON:  Chest x-ray May 24, 23. FINDINGS: Chronic enlargement the cardiac silhouette and pulmonary vascular congestion. Median sternotomy. No overt pulmonary edema. No consolidation. No visible pleural effusions or pneumothorax. Unchanged right paratracheal calcified lymph node. IMPRESSION: Chronic cardiomegaly and pulmonary vascular congestion without overt pulmonary edema. Electronically Signed   By: Margaretha Sheffield M.D.   On: 11/02/2021 12:06   US SCROTUM W/DOPPLER  Result Date: 10/30/2021 CLINICAL DATA:  Scrotal pain and swelling. Varicocele by physical exam. EXAM: SCROTAL ULTRASOUND DOPPLER ULTRASOUND OF THE TESTICLES TECHNIQUE: Complete ultrasound examination of the testicles, epididymis, and other scrotal structures was performed. Color and spectral Doppler ultrasound were also utilized to evaluate blood flow to the testicles. COMPARISON:  None Available. FINDINGS: Right testicle Measurements: 4.8 x 2.7 x 3.1 cm. No mass or microlithiasis visualized. There is scattered tubular ectasia of the rete testis along the testicular mediastinum. There is small complex ovoid extratesticular tunical lesion at the anteromedial aspect of the  superior pole of this testicle measuring 6.2 x 2.6 by 5.7 mm, possibly a complex tunical cyst or tunical granuloma. This does not register color flow on image 5/48. It appears to have a mostly if not completely solid architecture. Left testicle Measurements: 4.4 x 2.3 x 2.9 cm. No mass or microlithiasis visualized. There is tubular ectasia of the rete testis which is more advanced than on the right. Right epididymis:  Normal in size and appearance. Left epididymis:  Normal  in size and appearance. Hydrocele: There are symmetric bilateral small scrotal hydroceles with floating debris in the fluid bilaterally. Varicocele: None visualized on the right. On the left, there is a tangle of multiple ectatic veins in the lateral hemiscrotum measuring up to 4.1 mm with Valsalva consistent with a varicocele. Pulsed Doppler interrogation of both testes demonstrates normal low resistance arterial and venous waveforms bilaterally. IMPRESSION: 1. No evidence of testicular torsion, testicular mass or hyperemia, or epididymal hyperemia. 2. 6.2 x 2.6 x 5.7 mm extratesticular tunical lesion at the anteromedial aspect of the superior pole of the right testicle, appears mostly if not completely solid and could be a complex tunical cyst or granuloma. Other etiologies are possible. Consider 6-12 month follow-up ultrasound to ensure stability. There is no positive color flow. 3. Left-greater-than-right tubular ectasia of the rete testis. 4. Small scrotal hydroceles, both with floating debris. Electronically Signed   By: Telford Nab M.D.   On: 10/30/2021 20:39    Microbiology: Results for orders placed or performed in visit on 10/03/21  Microscopic Examination     Status: Abnormal   Collection Time: 10/03/21  9:39 AM   Urine  Result Value Ref Range Status   WBC, UA None seen 0 - 5 /hpf Final   RBC, Urine 0-2 0 - 2 /hpf Final   Epithelial Cells (non renal) None seen 0 - 10 /hpf Final   Casts Present (A) None seen /lpf Final    Cast Type Hyaline casts N/A Final   Bacteria, UA None seen None seen/Few Final   *Note: Due to a large number of results and/or encounters for the requested time period, some results have not been displayed. A complete set of results can be found in Results Review.    Labs: CBC: Recent Labs  Lab 11/02/21 1118 11/02/21 1810 11/03/21 0312 11/03/21 1048  WBC 5.4 4.0 3.5* 3.6*  HGB 8.4* 8.6* 8.8* 9.6*  HCT 28.3* 28.1* 30.4* 32.4*  MCV 83.2 82.2 83.7 83.1  PLT 141* 109* 117* 935*   Basic Metabolic Panel: Recent Labs  Lab 11/02/21 1118 11/03/21 0312  NA 141 141  K 3.4* 4.3  CL 100 102  CO2 31 30  GLUCOSE 115* 157*  BUN 59* 57*  CREATININE 2.40* 2.49*  CALCIUM 8.6* 8.8*  MG 2.1  --    Liver Function Tests: Recent Labs  Lab 11/02/21 1118  AST 18  ALT 9  ALKPHOS 121  BILITOT 0.9  PROT 6.8  ALBUMIN 3.4*   CBG: Recent Labs  Lab 11/03/21 1152 11/03/21 1624 11/03/21 2144 11/04/21 0909 11/04/21 1127  GLUCAP 212* 245* 291* 190* 186*    Discharge time spent: greater than 30 minutes.  This record has been created using Systems analyst. Errors have been sought and corrected,but may not always be located. Such creation errors do not reflect on the standard of care.   Signed: Lorella Nimrod, MD [facility] 11/04/2021

## 2021-11-04 NOTE — Progress Notes (Signed)
Patient given discharge instructions and verbalizes understanding. Patient with no complaints at the current time. Taken out Via WC.

## 2021-11-04 NOTE — Progress Notes (Signed)
Progress Note  Patient Name: [name]. Date of Encounter: 11/04/2021  [facility] Cardiologist: [name], MD   Subjective   Feeling OK.  No chest pain or shortness of breath.  He thinks that his left side may be hurting because of a pulled muscle.  Inpatient Medications    Scheduled Meds:  amLODipine  10 mg Oral Daily   apixaban  5 mg Oral BID   buPROPion  150 mg Oral BID   cholecalciferol  1,000 Units Oral QHS   colchicine  0.6 mg Oral QODAY   dapagliflozin propanediol  10 mg Oral QAC breakfast   febuxostat  80 mg Oral Q1400   fluticasone  2 spray Each Nare Daily   gabapentin  300 mg Oral BID   hydrALAZINE  12.5 mg Oral Q8H   insulin aspart  0-5 Units Subcutaneous QHS   insulin aspart  0-9 Units Subcutaneous TID WC   insulin glargine-yfgn  15 Units Subcutaneous BID   iron polysaccharides  150 mg Oral BID   lidocaine  1 patch Transdermal Q24H   [START ON 11/06/2021] metolazone  2.5 mg Oral Once per day on Tue Fri   montelukast  10 mg Oral QHS   multivitamin with minerals  1 tablet Oral Daily   mycophenolate  360 mg Oral BID   pantoprazole  40 mg Oral Daily   potassium chloride  20 mEq Oral Once   predniSONE  15 mg Oral Daily   rosuvastatin  10 mg Oral Daily   [START ON 11/05/2021] sulfamethoxazole-trimethoprim  1 tablet Oral Once per day on Mon Wed Fri   tacrolimus  2 mg Oral BID   tadalafil (PAH)  20 mg Oral QODAY   tamsulosin  0.4 mg Oral Daily   torsemide  100 mg Oral BID   zinc sulfate  220 mg Oral Daily   Continuous Infusions:   PRN Meds: acetaminophen, albuterol, dextromethorphan-guaiFENesin, diphenhydrAMINE, hydrALAZINE, morphine injection, nitroGLYCERIN, traMADol   Vital Signs    Vitals:   11/04/21 0500 11/04/21 0552 11/04/21 0800 11/04/21 1125  BP:   125/66 131/72  Pulse:   80 79  Resp:   15 16  Temp:   98 F (36.7 C) 97.6 F (36.4 C)  TempSrc:    Oral  SpO2:   95% 96%  Weight: 124 kg 116.4 kg    Height:         Intake/Output Summary (Last 24 hours) at 11/04/2021 1215 Last data filed at 11/04/2021 1000 Gross per 24 hour  Intake 1400 ml  Output 1650 ml  Net -250 ml      11/04/2021    5:52 AM 11/04/2021    5:00 AM 11/02/2021   11:07 AM  Last 3 Weights  Weight (lbs) 256 lb 8.5 oz 273 lb 5.9 oz 257 lb  Weight (kg) 116.362 kg 124 kg 116.574 kg      Telemetry    Atrial fibrillation.  Rates <100 bpm - Personally Reviewed  ECG    Atrial fibrillation.  Rate 88 bpm.  Multiple PVCs.  IVCD. - Personally Reviewed  Physical Exam   VS:  BP 131/72 (BP Location: Right Arm)   Pulse 79   Temp 97.6 F (36.4 C) (Oral)   Resp 16   Ht _0  (1.753 m)   Wt 116.4 kg   SpO2 96%   BMI 37.88 kg/m  , BMI Body mass index is 37.88 kg/m. GENERAL:  Well appearing HEENT: Pupils equal round and reactive, fundi  not visualized, oral mucosa unremarkable NECK:  No jugular venous distention, waveform within normal limits, carotid upstroke brisk and symmetric, no bruits, no thyromegaly LUNGS:  Clear to auscultation bilaterally HEART:  Irregularly irregular.  PMI not displaced or sustained,S1 and S2 within normal limits, no S3, no S4, no clicks, no rubs, no murmurs ABD:  Flat, positive bowel sounds normal in frequency in pitch, no bruits, no rebound, no guarding, no midline pulsatile mass, no hepatomegaly, no splenomegaly EXT:  2 plus pulses throughout, trace lower extremity edema, no cyanosis no clubbing SKIN:  No rashes no nodules NEURO:  Cranial nerves II through XII grossly intact, motor grossly intact throughout PSYCH:  Cognitively intact, oriented to person place and time   Labs    High Sensitivity Troponin:   Recent Labs  Lab 11/02/21 1118 11/02/21 1339 11/02/21 1810  TROPONINIHS 29* 30* 29*     Chemistry Recent Labs  Lab 11/02/21 1118 11/03/21 0312  NA 141 141  K 3.4* 4.3  CL 100 102  CO2 31 30  GLUCOSE 115* 157*  BUN 59* 57*  CREATININE 2.40* 2.49*  CALCIUM 8.6* 8.8*  MG 2.1  --    PROT 6.8  --   ALBUMIN 3.4*  --   AST 18  --   ALT 9  --   ALKPHOS 121  --   BILITOT 0.9  --   GFRNONAA 30* 28*  ANIONGAP 10 9    Lipids  Recent Labs  Lab 11/02/21 1118  CHOL 68  TRIG 74  HDL 31*  LDLCALC 22  CHOLHDL 2.2    Hematology Recent Labs  Lab 11/02/21 1810 11/03/21 0312 11/03/21 1048  WBC 4.0 3.5* 3.6*  RBC 3.42* 3.63* 3.90*  HGB 8.6* 8.8* 9.6*  HCT 28.1* 30.4* 32.4*  MCV 82.2 83.7 83.1  MCH 25.1* 24.2* 24.6*  MCHC 30.6 28.9* 29.6*  RDW 15.3 15.3 15.3  PLT 109* 117* 120*   Thyroid No results for input(s): "TSH", "FREET4" in the last 168 hours.  BNP Recent Labs  Lab 11/02/21 1118  BNP 413.0*    DDimer No results for input(s): "DDIMER" in the last 168 hours.   Radiology    CT ABDOMEN PELVIS WO CONTRAST  Result Date: 11/02/2021 CLINICAL DATA:  Left flank pain. EXAM: CT ABDOMEN AND PELVIS WITHOUT CONTRAST TECHNIQUE: Multidetector CT imaging of the abdomen and pelvis was performed following the standard protocol without IV contrast. RADIATION DOSE REDUCTION: This exam was performed according to the departmental dose-optimization program which includes automated exposure control, adjustment of the mA and/or kV according to patient size and/or use of iterative reconstruction technique. COMPARISON:  February 22, 2003. FINDINGS: Lower chest: No acute abnormality. Hepatobiliary: No focal liver abnormality is seen. Status post cholecystectomy. No biliary dilatation. Pancreas: Mild inflammatory stranding is noted around the pancreatic head and body concerning for acute pancreatitis. No pseudocyst formation is noted. Spleen: Moderate splenomegaly is noted. Adrenals/Urinary Tract: Adrenal glands appear normal. Severe bilateral renal atrophy is noted consistent with end-stage renal disease. No hydronephrosis or renal obstruction is noted. Urinary bladder is unremarkable. Renal transplant is noted in the right lower quadrant without definite evidence of hydronephrosis.  Stomach/Bowel: Status post gastric bypass. There is no evidence of bowel obstruction or inflammation. Vascular/Lymphatic: Aortic atherosclerosis. No enlarged abdominal or pelvic lymph nodes. Reproductive: Prostate is unremarkable. Other: No definite hernia is noted. Ill-defined area peritoneal fat stranding is noted inferior and lateral to the left kidney in the left pararenal space concerning for focal inflammation. This  area measures 8.5 x 7.9 cm. Musculoskeletal: No acute or significant osseous findings. IMPRESSION: Mild inflammatory stranding is noted around the pancreatic head and body concerning for acute pancreatitis. No pseudocyst formation is noted. Severe bilateral renal atrophy is noted consistent with history of end-stage renal disease. Right lower quadrant renal transplant is noted without hydronephrosis. However, there is a 9.5 x 7.9 cm ill-defined area of fat stranding in the left pararenal space inferior and lateral to the left kidney most consistent with focal inflammation or infarction. No definite fluid collection is seen at this time. Moderate splenomegaly is noted. Aortic Atherosclerosis (ICD10-I70.0). Electronically Signed   By: Marijo Conception M.D.   On: 11/02/2021 15:50    Cardiac Studies   TTE 07/2021 1. Left ventricular ejection fraction, by estimation, is 40 to 45%. Left  ventricular ejection fraction by PLAX is 40 %. The left ventricle has  mildly decreased function. The left ventricle demonstrates global  hypokinesis. The left ventricular internal  cavity size was moderately dilated. There is moderate left ventricular  hypertrophy. Left ventricular diastolic parameters are consistent with  Grade III diastolic dysfunction (restrictive). Elevated left ventricular  end-diastolic pressure.   2. Right ventricular systolic function is mildly reduced. The right  ventricular size is normal. Tricuspid regurgitation signal is inadequate  for assessing PA pressure.   3. Left atrial  size was severely dilated.   4. Right atrial size was moderately dilated.   5. A small pericardial effusion is present. The pericardial effusion is  circumferential. There is no evidence of cardiac tamponade.   6. The mitral valve is abnormal. Mild mitral valve regurgitation.  Moderate to severe mitral annular calcification.   7. The aortic valve is tricuspid. Aortic valve regurgitation is not  visualized.   8. The inferior vena cava is dilated in size with <50% respiratory  variability, suggesting right atrial pressure of 15 mmHg.    Myoview 02/2021   Findings are consistent with prior myocardial infarction. The study is low risk.   No ST deviation was noted.   LV perfusion is abnormal. There is no evidence of ischemia. There is evidence of infarction. Defect 1: There is a small defect with moderate reduction in uptake present in the apical inferior location(s) that is fixed.   Left ventricular function is abnormal. Global function is mildly reduced. End diastolic cavity size is moderately enlarged. End systolic cavity size is moderately enlarged.   Prior study available for comparison from 04/10/2018. No changes compared to prior study.   No evidence of ischemia.  Patient Profile     61 y.o. male with CAD s/p CABG, HFmfEF, ischemic cardiomyopathy, chronic atrial fibrillation, hypertension, ESRD status post kidney transplant, currently CKD 3, OSA on BiPAP, pulmonary hypertension admitted with chest pain.  Assessment & Plan    #CAD status post CABG: #Chest pain: High-sensitivity troponin mildly elevated and flat in a pattern not consistent with ACS.  He has not been on nitrates given that he takes tadalafil for pulmonary hypertension.  Renal dysfunction prohibits Ranexa.  Continue medical management with amlodipine, clopidogrel, and rosuvastatin.  No plan for invasive cardiac evaluation at this time.  He remains chest pain-free.  In retrospect he thinks that his flank pain may be due to a  pulled muscle.  #Chronic atrial fibrillation: Patient was noted to be in atrial fibrillation on admission.  Rates are well controlled on carvedilol.  Continue Eliquis.  #Hypertension: Blood pressure well controlled on amlodipine and hydralazine.  #Hyperlipidemia: Continue rosuvastatin.  For questions or updates, please contact [place] Please consult [email or web address] for contact info under        Signed, Skeet Latch, MD  11/04/2021, 12:15 PM

## 2021-11-05 ENCOUNTER — Other Ambulatory Visit: Payer: Self-pay

## 2021-11-05 ENCOUNTER — Telehealth: Payer: Self-pay

## 2021-11-05 NOTE — Telephone Encounter (Signed)
PCP sent msg that pt needs f/u apt. Per earlier note today pt does not want to make an apt at this time and does have f/u with cardiology.

## 2021-11-05 NOTE — Telephone Encounter (Signed)
Pt does not see the need to fu with Dr Silvio Pate- if he needs him he will call to make appt- pt has fu appts with Dr Aundra Dubin 11-07-21 at 920am and Dr Sharolyn Douglas 11-27-21 at 850am

## 2021-11-05 NOTE — Telephone Encounter (Signed)
Okay to just keep cardiology appointments for now

## 2021-11-06 ENCOUNTER — Telehealth (HOSPITAL_COMMUNITY): Payer: Self-pay

## 2021-11-06 NOTE — Telephone Encounter (Signed)
Contacted [name] to check on him since hospital stay and remind him of cardiology appt tomorrow with echo.  He states remembers and he has been feeling good since home.  Been resting a lot.  His wife has been down with her back also for past week or so.  They been trying to take care of each other.  He states has everything for daily living.  He states has all his medications.  Will continue to visit for heart failure, diet and medication management.   [place] [phone]

## 2021-11-07 ENCOUNTER — Ambulatory Visit (HOSPITAL_BASED_OUTPATIENT_CLINIC_OR_DEPARTMENT_OTHER)
Admission: RE | Admit: 2021-11-07 | Discharge: 2021-11-07 | Disposition: A | Discharge status: Home / Self Care | Payer: 59 | Source: Ambulatory Visit | Attending: Family Medicine | Admitting: Family Medicine

## 2021-11-07 ENCOUNTER — Encounter (HOSPITAL_COMMUNITY): Payer: Self-pay | Admitting: Cardiology

## 2021-11-07 ENCOUNTER — Ambulatory Visit (HOSPITAL_COMMUNITY)
Admission: RE | Admit: 2021-11-07 | Discharge: 2021-11-07 | Disposition: A | Discharge status: Home / Self Care | Payer: 59 | Source: Ambulatory Visit | Attending: Cardiology | Admitting: Cardiology

## 2021-11-07 ENCOUNTER — Other Ambulatory Visit (HOSPITAL_COMMUNITY): Payer: 59

## 2021-11-07 ENCOUNTER — Other Ambulatory Visit: Payer: Self-pay

## 2021-11-07 VITALS — BP 140/80 | HR 68 | Wt 264.4 lb

## 2021-11-07 DIAGNOSIS — I5042 Chronic combined systolic (congestive) and diastolic (congestive) heart failure: Secondary | ICD-10-CM | POA: Diagnosis not present

## 2021-11-07 DIAGNOSIS — I2510 Atherosclerotic heart disease of native coronary artery without angina pectoris: Secondary | ICD-10-CM | POA: Insufficient documentation

## 2021-11-07 DIAGNOSIS — I110 Hypertensive heart disease with heart failure: Secondary | ICD-10-CM | POA: Diagnosis not present

## 2021-11-07 DIAGNOSIS — E785 Hyperlipidemia, unspecified: Secondary | ICD-10-CM | POA: Insufficient documentation

## 2021-11-07 DIAGNOSIS — G4730 Sleep apnea, unspecified: Secondary | ICD-10-CM | POA: Diagnosis not present

## 2021-11-07 DIAGNOSIS — I080 Rheumatic disorders of both mitral and aortic valves: Secondary | ICD-10-CM | POA: Insufficient documentation

## 2021-11-07 DIAGNOSIS — I050 Rheumatic mitral stenosis: Secondary | ICD-10-CM

## 2021-11-07 DIAGNOSIS — E119 Type 2 diabetes mellitus without complications: Secondary | ICD-10-CM | POA: Diagnosis not present

## 2021-11-07 DIAGNOSIS — Z951 Presence of aortocoronary bypass graft: Secondary | ICD-10-CM | POA: Diagnosis not present

## 2021-11-07 HISTORY — DX: Rheumatic mitral stenosis: I05.0

## 2021-11-07 LAB — ECHOCARDIOGRAM COMPLETE
AR max vel: 1.14 cm2
AV Area VTI: 1.06 cm2
AV Area mean vel: 1.15 cm2
AV Mean grad: 15 mmHg
AV Peak grad: 28.9 mmHg
Ao pk vel: 2.69 m/s
Area-P 1/2: 3.78 cm2
Calc EF: 47.9 %
S' Lateral: 4.2 cm
Single Plane A2C EF: 50.4 %
Single Plane A4C EF: 42 %

## 2021-11-07 LAB — BASIC METABOLIC PANEL
Anion gap: 11 (ref 5–15)
BUN: 74 mg/dL — ABNORMAL HIGH (ref 8–23)
CO2: 32 mmol/L (ref 22–32)
Calcium: 9.1 mg/dL (ref 8.9–10.3)
Chloride: 101 mmol/L (ref 98–111)
Creatinine, Ser: 2.15 mg/dL — ABNORMAL HIGH (ref 0.61–1.24)
GFR, Estimated: 34 mL/min — ABNORMAL LOW (ref >60)
Glucose, Bld: 131 mg/dL — ABNORMAL HIGH (ref 70–99)
Potassium: 2.8 mmol/L — ABNORMAL LOW (ref 3.5–5.1)
Sodium: 144 mmol/L (ref 135–145)

## 2021-11-07 LAB — BRAIN NATRIURETIC PEPTIDE: B Natriuretic Peptide: 332.3 pg/mL — ABNORMAL HIGH (ref 0.0–100.0)

## 2021-11-07 MED ORDER — METOLAZONE 2.5 MG PO TABS
2.5000 mg | ORAL_TABLET | ORAL | 6 refills | Status: DC
  Filled 2021-11-07: qty 20, 47d supply, fill #0

## 2021-11-07 MED ORDER — TADALAFIL (PAH) 20 MG PO TABS
20.0000 mg | ORAL_TABLET | Freq: Every day | ORAL | 3 refills | Status: DC
  Filled 2021-11-07: qty 30, 30d supply, fill #0
  Filled 2021-11-07: qty 90, 90d supply, fill #0
  Filled 2021-12-14: qty 30, 30d supply, fill #1
  Filled 2022-01-17: qty 30, 30d supply, fill #2
  Filled 2022-02-19: qty 30, 30d supply, fill #3
  Filled 2022-03-20: qty 30, 30d supply, fill #4

## 2021-11-07 NOTE — Patient Instructions (Addendum)
Increase [place] to every Monday, Wednesday and Friday  Increase Tadalifil to 65m daily  Labs done today, your results will be available in MyChart, we will contact you for abnormal readings.  Repeat blood work in 10 days [place] schedule a follow-up appointment in: 1 month.  If you have any questions or concerns before your next appointment please send [place] message through mPriceor call our office at [phone]    TO LEAVE A MESSAGE FOR THE NURSE SELECT OPTION 2, PLEASE LEAVE A MESSAGE INCLUDING: YOUR NAME DATE OF BIRTH CALL BACK NUMBER REASON FOR CALL**this is important as we prioritize the call backs  YOU WILL RECEIVE A CALL BACK THE SAME DAY AS LONG AS YOU CALL BEFORE 4:00 PM  [facility] you and your health needs are our priority. As part of our continuing mission to provide you with exceptional heart care, we have created designated Provider Care Teams. These Care Teams include your primary Cardiologist (physician) and Advanced Practice Providers (APPs- Physician Assistants and Nurse Practitioners) who all work together to provide you with the care you need, when you need it.   You may see any of the following providers on your designated Care Team at your next follow up: Dr DGlori BickersDr DHaynes Kerns NP BLyda Jester PUtahJPar[facility] HospitalLTurin PUtahLAudry Riles PharmD   Please be sure to bring in all your medications bottles to every appointment.

## 2021-11-07 NOTE — Progress Notes (Signed)
*  PRELIMINARY RESULTS* Echocardiogram 2D Echocardiogram has been performed.  [name] Isola Mehlman 11/07/2021, 8:51 AM

## 2021-11-08 ENCOUNTER — Telehealth (HOSPITAL_COMMUNITY): Payer: Self-pay | Admitting: *Deleted

## 2021-11-08 ENCOUNTER — Other Ambulatory Visit: Payer: Self-pay

## 2021-11-08 ENCOUNTER — Other Ambulatory Visit (HOSPITAL_COMMUNITY): Payer: Self-pay | Admitting: *Deleted

## 2021-11-08 DIAGNOSIS — I5042 Chronic combined systolic (congestive) and diastolic (congestive) heart failure: Secondary | ICD-10-CM

## 2021-11-08 DIAGNOSIS — E876 Hypokalemia: Secondary | ICD-10-CM

## 2021-11-08 MED ORDER — POTASSIUM CHLORIDE ER 8 MEQ PO CPCR: 100.0000 meq | ORAL_CAPSULE | Freq: Two times a day (BID) | ORAL | 6 refills | Status: DC

## 2021-11-08 NOTE — Progress Notes (Addendum)
Advanced Heart Failure Clinic Note   Date:  11/08/2021   ID:  Carl Michels., DOB 09-24-1960, MRN [id number]   Provider location: [address] [place] Type of Visit:  Established patient  PCP:  [name], MD  Cardiologist:  [name] Nephrology: Dr. Ronnald Ramp  HF Cardiologist: [name]  HPI: Carl Dionte Blaustein. is a 61 y.o. male  with a history of CAD s/p CABG 2005, chronic atrial fibrillation on Eliquis, ESRD s/p renal transplant 2012, anc chronic diastolic CHF.  His renal function has been fairly stable, most recent creatinine 1.43.  He has been struggling with volume overload/CHF.  He has a history of ischemic cardiomyopathy with EF as low as 35-40% in the past but echo in 3/20 showed EF 60-65%.  There is evidence for RV failure with pulmonary hypertension by echo.    Admitted 11/05/18 low grade fever and shortness of breath. HS Trop negative. Covid 19 negative. Blood CX negative. Placed on antibiotics and discharged to home the next day.   Admitted 3/0/05 with A/C Diastolic HF. Diuresed with IV lasix and transitioned to torsemide 80/40 mg . Discharge weight 252 pounds.   Admitted 06/2019 with left arm pain. Left upper extremity venous Doppler revealed occluded cephalic vein at the AV fistula outflow tract in the antecubital fossa with no other thrombus identified. Vascular evaluated. He continued on Eliquis.  No plan for intervention. Also treated for acute gout flare. Discharged on prednisone taper.     Given IV lasix on 12/21 by HF Paramedicine.   Admitted to [facility] 2/22 with acute gout flare vs cellulitis. Nephrology was consulted to evaluate transplant regimen and follow along. He was treated with antibiotics and prednisone. Discharged on decreased torsemide dose of 40 mg daily. Weight 259 lbs.  Admitted 4/22 for left leg cellulitis after sustaining a dog scratch, received IV antibiotics. No changes to his diuretic therapy. Discharge weight 266  lbs.  Follow up with [name], 1/23, he had CP and underwent Lexiscan myoview, which overall was a low risk study with evidence of prior infarct in the infero-apical area with no significant change from before. Losartan added to GDMT regimen.  Follow up 5/23, volume overloaded, REDs 40%. Advised taking additional metolazone (3 doses that week), however labs showed worsening renal function and advised to keep at 2 doses.  Admitted in 5/23 with acute on chronic CHF and AKI.  [name] and losartan stopped. Diuresed with IV lasix. Echo showed EF 40-45%, RV mildly reduced, GIIIDD (restrictive), IVC dilated. Nephrology consulted for diuretic dosing. Discharged home, weight 266 lbs.  He had chest pain in 8/23 and was admitted at [facility], minimal HS-TnI elevation with no trend, thought to be demand ischemia from volume overload.    Echo was done today and reviewed, showing EF 40-45%, diffuse hypokinesis with dyssynchrony, mildly dilated RV with mildly decreased systolic function, IVC dilated, moderate AS with mean gradient 15, mild-moderate mitral stenosis with mean gradient 6.   He returns for followup of CHF.  He is short of breath with inclines and stairs, no dyspnea walking at a steady pace on flat ground.  No further chest pain. No orthopnea/PND.  Weight is down 6 lbs.   Labs (7/19): LDL 49 Labs (2/20): K 3.7, creatinine 1.43 Labs (4/20): K 4.3, creatinine 1.24 Labs (11/06/18): K 3.7 creatinine 1.23  Labs (05/07/19): K 3.8 Creatinine 1.5  Labs (07/18/19): K 4.3 Creatinine 1.42  Labs (5/21): K 3.3, creatinine 1.21  Labs (11/21): K 3.6, creatinine 1.4 Labs (12/21): K 3.3, creatinine 2.4, hgb 12.8 Labs (04/01/19): K 3.3 Creatinine 2.2  Labs (8/22): K 4.0, creatinine 1.6, HDL 35, LDL 36, a1c 7.5 Labs (2/23): LDL 31, HDL 36 Labs (4/23): K 3.3, creatinine 1.68 Labs (5/23): K 4.1, creatinine 2.24 Labs (8/23): K 3.9, creatinine 2.74  PMH: 1. CAD: s/p CABG x 4 in [place] in 2005.  - Cardiolite (1/20):  EF 47%, small fixed apical septal defect, no ischemia.  Low risk study.  - Cardiolite (12/22): small apical inferior defect, no changes from prior, no ischemia. Low risk study. 2. Asthma 3. Gout 4. Atrial fibrillation: Chronic.  5. ESRD s/p renal transplantation at [facility] in 2012.  6. Anemia of chronic disease 7. Type II diabetes 8. HTN 9. Hyperlipidemia 10. OSA: On bipap.  11. Ischemic cardiomyopathy: EF 35-40% in past.  - Cardiolite (1/20) with EF 47%.  - Echo (1/20): EF 50-55%, mild MR, RV moderately dilated with moderately decreased systolic function, PASP 73 mmHg, moderate TR.   - Echo (3/20): EF 60-65%, PASP 75 mmHg, mildly dilated RV with mildly decreased systolic function.  - Echo (2/21): EF 45-50%, global HK, moderately dilated RV with moderately decreased systolic function, D-shaped interventricular septum, dilated IVC.  - RHC (2/21): mean RA 19, PA 78/31 mean 51, mean PCWP 26, CI 3.26, PVR 3.25 - Echo (5/23): EF 40-45%, RV mildly reduced, GIIIDD (restrictive) - Echo (8/23): EF 40-45%, diffuse hypokinesis with dyssynchrony, mildly dilated RV with mildly decreased systolic function, IVC dilated, moderate AS with mean gradient 15, mild-moderate mitral stenosis with mean gradient 6.  12. Obesity: s/p gastric bypass in 2015.  13. Bradycardia with beta blocker.  14. Aortic stenosis: Moderate on 8/23 echo.  15. Mitral stenosis: Mild to moderate on 8/23 echo.   Past Surgical History:  Procedure Laterality Date   BARIATRIC SURGERY     CARDIAC CATHETERIZATION     [facility]   CARDIAC CATHETERIZATION  05/03/2019   CATARACT EXTRACTION W/PHACO Right 10/29/2017   Procedure: CATARACT EXTRACTION PHACO AND INTRAOCULAR LENS PLACEMENT ([place])  RIGHT DIABETIC;  Surgeon: [name], MD;  Location: [place];  Service: Ophthalmology;  Laterality: Right;  Diabetic - insulin   CATARACT EXTRACTION W/PHACO Left 11/18/2017   Procedure: CATARACT EXTRACTION PHACO AND INTRAOCULAR LENS  PLACEMENT ([place]) LEFT IVA/TOPICAL;  Surgeon: [name], MD;  Location: [place];  Service: Ophthalmology;  Laterality: Left;  DIABETES - insulin sleep apnea   COLONOSCOPY WITH PROPOFOL N/A 04/22/2013   Procedure: COLONOSCOPY WITH PROPOFOL;  Surgeon: [name], MD;  Location: [facility];  Service: Endoscopy;  Laterality: N/A;   CORONARY ARTERY BYPASS GRAFT  2005   X 4   DG ANGIO AV SHUNT*L*     right and left upper arms   FASCIOTOMY  03/03/2012   Procedure: FASCIOTOMY;  Surgeon: [name], MD;  Location: [place];  Service: Orthopedics;  Laterality: Right;  FASCIOTOMY RIGHT SMALL FINGER   FASCIOTOMY Left 08/17/2013   Procedure: FASCIOTOMY LEFT RING;  Surgeon: [name], MD;  Location: [place];  Service: Orthopedics;  Laterality: Left;   INCISION AND DRAINAGE ABSCESS Left 10/15/2015   Procedure: INCISION AND DRAINAGE ABSCESS;  Surgeon: [name], MD;  Location: [facility];  Service: General;  Laterality: Left;   KIDNEY TRANSPLANT  09/13/2010   cadaver--at [facility] HEART CATH N/A 11/15/2016   Procedure: RIGHT HEART CATH;  Surgeon: [name], MD;  Location: [place] CV [facility];  Service: Cardiovascular;  Laterality: N/A;   RIGHT HEART CATH N/A 05/03/2019   Procedure: RIGHT HEART CATH;  Surgeon: [name], MD;  Location: [place] CV [facility];  Service: Cardiovascular;  Laterality: N/A;   TYMPANIC MEMBRANE REPAIR  1/12   left   VASECTOMY     Current Outpatient Medications  Medication Sig Dispense Refill   albuterol (PROVENTIL) (2.5 MG/3ML) 0.083% nebulizer solution Take 3 mLs (2.5 mg total) by nebulization every 6 (six) hours as needed for wheezing or shortness of breath. 150 mL 0   albuterol (VENTOLIN HFA) 108 (90 Base) MCG/ACT inhaler Inhale 2 puffs into the lungs every 6 (six) hours as needed for wheezing or shortness of breath. 18 g 5   allopurinol (ZYLOPRIM) 100 MG tablet Take 100 mg by mouth daily.      apixaban (ELIQUIS) 5 MG TABS tablet TAKE 1 TABLET BY MOUTH TWICE DAILY 180 tablet 1   beclomethasone (QVAR) 80 MCG/ACT inhaler Inhale 2 puffs into the lungs 2 (two) times daily. 10.6 g 5   buPROPion (WELLBUTRIN SR) 150 MG 12 hr tablet TAKE 1 TABLET BY MOUTH TWICE DAILY 180 tablet 3   cholecalciferol (VITAMIN D3) 25 MCG (1000 UNIT) tablet Take 1,000 Units by mouth at bedtime.     colchicine 0.6 MG tablet Take 1 tablet (0.6 mg total) by mouth every other day 30 tablet 1   Continuous Blood Gluc Sensor (FREESTYLE LIBRE 3 SENSOR) MISC Use one every 2 weeks. 6 each 3   dapagliflozin propanediol (FARXIGA) 10 MG TABS tablet Take 1 tablet (10 mg total) by mouth daily before breakfast. 30 tablet 11   Febuxostat 80 MG TABS Take 1 tablet (80 mg total) by mouth once daily for 180 days 30 tablet 5   fluticasone (FLONASE) 50 MCG/ACT nasal spray PLACE 2 SPRAYS INTO BOTH NOSTRILS DAILY. 16 g 11   gabapentin (NEURONTIN) 300 MG capsule TAKE 1 CAPSULE BY MOUTH TWICE DAILY 180 capsule 0   hydrALAZINE (APRESOLINE) 25 MG tablet Take 1/2 tablet (12.5 mg total) by mouth every 8 (eight) hours. 45 tablet 0   hydrocortisone 2.5 % cream Apply topically 3 (three) times daily as needed. 30 g 3   insulin glargine-yfgn (SEMGLEE) 100 UNIT/ML Pen Inject 40 Units subcutaneously once daily 45 mL 3   insulin lispro (HUMALOG KWIKPEN) 100 UNIT/ML KwikPen INJECT UP TO 50 UNITS UNDER THE SKIN DAILY AS DIRECTED 45 mL 3   Insulin Pen Needle (UNIFINE PENTIPS) 31G X 5 MM MISC Use 4 times daily as directed 400 each 3   iron polysaccharides (FERREX 150) 150 MG capsule TAKE 1 CAPSULE BY MOUTH 2 TIMES DAILY. 180 capsule 1   lidocaine (LIDODERM) 5 % Place 1 patch onto the skin daily. Remove & Discard patch within 12 hours or as directed by MD 30 patch 0   montelukast (SINGULAIR) 10 MG tablet TAKE 1 TABLET BY MOUTH AT BEDTIME. 90 tablet 3   Multiple Vitamin (MULTIVITAMIN WITH MINERALS) TABS tablet Take 1 tablet by mouth daily.     mycophenolate  (MYFORTIC) 180 MG EC tablet Take 360 mg by mouth 2 (two) times daily.     omeprazole (PRILOSEC) 20 MG capsule TAKE 1 CAPSULE BY MOUTH DAILY. 90 capsule 3   predniSONE (DELTASONE) 5 MG tablet Take 1 tablet (5 mg total) by mouth daily. 90 tablet 3   rosuvastatin (CRESTOR) 10 MG tablet Take 1 tablet (10 mg total) by mouth daily. 90 tablet 3  sulfamethoxazole-trimethoprim (BACTRIM) 400-80 MG tablet TAKE 1 TABLET BY MOUTH EVERY MONDAY, WEDNESDAY, FRIDAY. 36 tablet 3   tacrolimus (PROGRAF) 1 MG capsule Take 2 capsules (2 mg total) by mouth 2 times daily. 120 capsule 5   tamsulosin (FLOMAX) 0.4 MG CAPS capsule TAKE 1 CAPSULE BY MOUTH DAILY. 90 capsule 3   torsemide (DEMADEX) 100 MG tablet Take 1 tablet (100 mg total) by mouth 2 (two) times daily. 180 tablet 3   traMADol (ULTRAM) 50 MG tablet Take 1 tablet (50 mg total) by mouth every 6 (six) hours as needed for moderate pain or severe pain. 30 tablet 0   zinc sulfate 220 (50 Zn) MG capsule Take 220 mg by mouth daily.     metolazone (ZAROXOLYN) 2.5 MG tablet Take 1 tablet (2.5 mg total) by mouth 3 (three) times a week. Monday, Wednesday Friday 20 tablet 6   Potassium Chloride CR (MICRO-K) 8 MEQ CPCR capsule CR Take 13 capsules (104 mEq total) by mouth 2 (two) times daily. 780 capsule 6   tadalafil, PAH, (ADCIRCA) 20 MG tablet Take 1 tablet (20 mg total) by mouth daily. 90 tablet 3   No current facility-administered medications for this encounter.    Allergies:   Contrast media [iodinated contrast media], Iodine, and Ibuprofen   Social History:  The patient  reports that he quit smoking about 27 years ago. His smoking use included cigars. He has never been exposed to tobacco smoke. He has never used smokeless tobacco. He reports current alcohol use. He reports that he does not use drugs.   Family History:  The patient's family history includes Breast cancer in his maternal grandmother; Diabetes in his maternal grandmother; Heart disease in his father;  Kidney disease in his father; Kidney failure in his father; Liver cancer in his paternal grandmother and paternal uncle; Valvular heart disease in his mother.   ROS:  Please see the history of present illness.   All other systems are personally reviewed and negative.   Recent Labs: 11/02/2021: Magnesium 2.1 11/03/2021: Hemoglobin 9.6; Platelets 120 11/04/2021: ALT 10 11/07/2021: B Natriuretic Peptide 332.3; BUN 74; Creatinine, Ser 2.15; Potassium 2.8; Sodium 144  Personally reviewed   Wt Readings from Last 3 Encounters:  11/07/21 119.9 kg (264 lb 6.4 oz)  11/04/21 116.4 kg (256 lb 8.5 oz)  10/31/21 115.7 kg (255 lb)    BP (!) 140/80   Pulse 68   Wt 119.9 kg (264 lb 6.4 oz)   SpO2 98%   BMI 39.05 kg/m  General: NAD Neck: JVP 10-12 cm, no thyromegaly or thyroid nodule.  Lungs: Clear to auscultation bilaterally with normal respiratory effort. CV: Nondisplaced PMI.  Heart irregular S1/S2, no S3/S4, 2/6 SEM RUSB with clear S2.  1+ ankle edema.  No carotid bruit.  Normal pedal pulses.  Abdomen: Soft, nontender, no hepatosplenomegaly, no distention.  Skin: Intact without lesions or rashes.  Neurologic: Alert and oriented x 3.  Psych: Normal affect. Extremities: No clubbing or cyanosis.  HEENT: Normal.   Assessment & Plan: 1. Chronic Heart failure with Mid Range EF/ With prominent RV failure/pulmonary hypertension:  Ischemic cardiomyopathy.  ?If RV failure is related to severe OSA, he was a remote smoker. Echo in 2/21 with EF 45-50%, moderately dilated RV with moderately decreased systolic function, moderate pulmonary hypertension. Echo today showed EF 40-45%, diffuse hypokinesis with dyssynchrony, mildly dilated RV with mildly decreased systolic function, IVC dilated, moderate AS with mean gradient 15, mild-moderate mitral stenosis with mean gradient 6.  On  exam, he remains volume overloaded despite high diuretic doses at home. NYHA class II-III symptoms. CKD stage 3 makes management more  difficult.  - Continue torsemide 100 mg bid and will increase metolazone to 2.5 mg three times/week. BMET/BNP today and BMET in 10 days.  - Continue Farxiga 10 mg daily. - He is now off ARB and spironolactone with elevated creatinine.  - Continue hydralazine 12.5 mg tid for afterload reduction.  - No Imdur given tadalafil use for PH.  2. Atrial fibrillation: Chronic. Rate controlled.  - Continue Eliquis. No bleeding issues.  - Not on beta blocker with history of bradycardia.   3. CAD: S/p CABG 2005.  Cardiolite 12/22 with prior MI, no ischemia.  No further chest pain. High threshold for cath given CKD.  - No ASA given Eliquis use.  - Continue Crestor 10 mg daily, good lipids 2/23. 4. CKD Stage 3: S/p renal transplantation in 2012. Follow[facility] center [facility] He remains on mycophenolate and tacrolimus. Last creatinine 2.74.  - BMET today and in 10 days with diuretic adjustment.  5. OSA: Continue Bipap every night.  6. HTN: BP mildly elevated today, but consistently controlled at home. - Meds as above. No change today. 7. DM 2: On insulin.  - Continue dapagliflozin - Has f/u with endocrine. 8. Pulmonary hypertension: Severe pulmonary hypertension by 3/20 echo.  RHC in 2/21 with moderate, mixed pulmonary venous/pulmonary arterial hypertension (PVR 3.25 WU). Suspect Suspect group 2 and group 3 PH (OSA, ? unrecognized OHS).  - Using BiPAP nightly. - He has been on tadalafil, taking it qod.  Increase tadalafil to 20 mg daily.  9. Aortic stenosis: Moderate on today's echo.  10. Mitral stenosis: Mild to moderate on today's echo. Suspect due to calcific degeneration, not rheumatic heart disease.   Followup APP in 1 month.   Loralie Champagne. 11/08/2021

## 2021-11-08 NOTE — Telephone Encounter (Signed)
Called patient per [name] re: this week's lab results and left message. Instructed patient to increase potassium to 100 meq twice daily; Rx sent. He will need repeat lab on Monday, 11/12/21 at 10:00 am. Asked patient to call office if any questions or if he needs to re-schedule lab appt  Zada Girt RN  Heart Failure CLinic [phone]

## 2021-11-08 NOTE — Addendum Note (Signed)
Encounter addended by: [name], MD on: 11/08/2021 11:13 PM  Actions taken: Clinical Note Signed

## 2021-11-09 ENCOUNTER — Other Ambulatory Visit: Payer: Self-pay

## 2021-11-09 ENCOUNTER — Telehealth (HOSPITAL_COMMUNITY): Payer: Self-pay

## 2021-11-09 ENCOUNTER — Other Ambulatory Visit
Admission: RE | Admit: 2021-11-09 | Discharge: 2021-11-09 | Disposition: A | Discharge status: Home / Self Care | Payer: 59 | Attending: Cardiology | Admitting: Cardiology

## 2021-11-09 DIAGNOSIS — I5042 Chronic combined systolic (congestive) and diastolic (congestive) heart failure: Secondary | ICD-10-CM | POA: Diagnosis not present

## 2021-11-09 DIAGNOSIS — E876 Hypokalemia: Secondary | ICD-10-CM | POA: Diagnosis not present

## 2021-11-09 LAB — BASIC METABOLIC PANEL
Anion gap: 8 (ref 5–15)
BUN: 63 mg/dL — ABNORMAL HIGH (ref 8–23)
CO2: 34 mmol/L — ABNORMAL HIGH (ref 22–32)
Calcium: 9.3 mg/dL (ref 8.9–10.3)
Chloride: 100 mmol/L (ref 98–111)
Creatinine, Ser: 2.02 mg/dL — ABNORMAL HIGH (ref 0.61–1.24)
GFR, Estimated: 37 mL/min — ABNORMAL LOW (ref >60)
Glucose, Bld: 159 mg/dL — ABNORMAL HIGH (ref 70–99)
Potassium: 3.5 mmol/L (ref 3.5–5.1)
Sodium: 142 mmol/L (ref 135–145)

## 2021-11-09 MED ORDER — POTASSIUM CHLORIDE ER 8 MEQ PO CPCR: 100.0000 meq | ORAL_CAPSULE | Freq: Two times a day (BID) | ORAL | 6 refills | Status: DC

## 2021-11-09 NOTE — Telephone Encounter (Addendum)
Pt aware, agreeable, and verbalized understanding  Labs scheduled for Monday  Med list updated   ----- Message from [name], MD sent at 11/07/2021  5:03 PM EDT ----- Increase KCl to 100 mEq bid.  BMET Friday.

## 2021-11-12 ENCOUNTER — Telehealth: Payer: Self-pay | Admitting: Internal Medicine

## 2021-11-12 ENCOUNTER — Other Ambulatory Visit
Admission: RE | Admit: 2021-11-12 | Discharge: 2021-11-12 | Disposition: A | Discharge status: Home / Self Care | Payer: 59 | Attending: Cardiology | Admitting: Cardiology

## 2021-11-12 ENCOUNTER — Other Ambulatory Visit (HOSPITAL_COMMUNITY): Payer: Self-pay

## 2021-11-12 ENCOUNTER — Other Ambulatory Visit: Payer: Self-pay

## 2021-11-12 ENCOUNTER — Other Ambulatory Visit (HOSPITAL_COMMUNITY): Payer: 59

## 2021-11-12 DIAGNOSIS — I5042 Chronic combined systolic (congestive) and diastolic (congestive) heart failure: Secondary | ICD-10-CM

## 2021-11-12 DIAGNOSIS — M25511 Pain in right shoulder: Secondary | ICD-10-CM | POA: Diagnosis not present

## 2021-11-12 DIAGNOSIS — M19011 Primary osteoarthritis, right shoulder: Secondary | ICD-10-CM | POA: Insufficient documentation

## 2021-11-12 DIAGNOSIS — M13811 Other specified arthritis, right shoulder: Secondary | ICD-10-CM | POA: Diagnosis not present

## 2021-11-12 DIAGNOSIS — M7591 Shoulder lesion, unspecified, right shoulder: Secondary | ICD-10-CM | POA: Diagnosis not present

## 2021-11-12 LAB — BASIC METABOLIC PANEL
Anion gap: 10 (ref 5–15)
BUN: 63 mg/dL — ABNORMAL HIGH (ref 8–23)
CO2: 31 mmol/L (ref 22–32)
Calcium: 8.9 mg/dL (ref 8.9–10.3)
Chloride: 99 mmol/L (ref 98–111)
Creatinine, Ser: 2.36 mg/dL — ABNORMAL HIGH (ref 0.61–1.24)
GFR, Estimated: 30 mL/min — ABNORMAL LOW (ref >60)
Glucose, Bld: 174 mg/dL — ABNORMAL HIGH (ref 70–99)
Potassium: 3.5 mmol/L (ref 3.5–5.1)
Sodium: 140 mmol/L (ref 135–145)

## 2021-11-12 NOTE — Telephone Encounter (Signed)
Spoke to pt.

## 2021-11-12 NOTE — Telephone Encounter (Signed)
Patient called and stated that he needs a referral to [facility] on [address] in [place] on right shoulder. Call back number [phone].

## 2021-11-13 ENCOUNTER — Ambulatory Visit: Payer: 59 | Admitting: Cardiovascular Disease

## 2021-11-13 ENCOUNTER — Telehealth (HOSPITAL_COMMUNITY): Payer: Self-pay

## 2021-11-13 ENCOUNTER — Other Ambulatory Visit: Payer: Self-pay

## 2021-11-13 MED ORDER — TRAMADOL HCL 50 MG PO TABS
ORAL_TABLET | ORAL | 0 refills | Status: DC
  Filled 2021-11-13: qty 20, 5d supply, fill #0

## 2021-11-13 NOTE — Telephone Encounter (Signed)
Attempted to contact, no answer.    [facility] [phone]

## 2021-11-14 ENCOUNTER — Encounter: Payer: Self-pay | Admitting: Urology

## 2021-11-14 ENCOUNTER — Ambulatory Visit (INDEPENDENT_AMBULATORY_CARE_PROVIDER_SITE_OTHER): Payer: 59 | Admitting: Urology

## 2021-11-14 VITALS — BP 167/69 | HR 73 | Ht 70.0 in | Wt 263.0 lb

## 2021-11-14 DIAGNOSIS — N401 Enlarged prostate with lower urinary tract symptoms: Secondary | ICD-10-CM

## 2021-11-14 DIAGNOSIS — N138 Other obstructive and reflux uropathy: Secondary | ICD-10-CM | POA: Diagnosis not present

## 2021-11-15 LAB — URINALYSIS, COMPLETE
Bilirubin, UA: NEGATIVE
Ketones, UA: NEGATIVE
Leukocytes,UA: NEGATIVE
Nitrite, UA: NEGATIVE
Protein,UA: NEGATIVE
RBC, UA: NEGATIVE
Specific Gravity, UA: <1.005 — ABNORMAL LOW (ref 1.005–1.030)
Urobilinogen, Ur: 0.2 mg/dL (ref 0.2–1.0)
pH, UA: 5 (ref 5.0–7.5)

## 2021-11-15 LAB — PSA: Prostate Specific Ag, Serum: 1.8 ng/mL (ref 0.0–4.0)

## 2021-11-15 LAB — MICROSCOPIC EXAMINATION: Bacteria, UA: NONE SEEN

## 2021-11-15 NOTE — Progress Notes (Signed)
11/15/21  Chief Complaint  Patient presents with   Cysto    TRUS     HPI: 61 year old male with refractory urinary symptoms related to BPH who presents today to the office for cystoscopy and prostate sizing.  His most bothersome symptoms are urinary hesitancy and weak urinary stream.   Please see previous notes for details.     Blood pressure (!) 167/69, pulse 73, height 5' 10" (1.778 m), weight 263 lb (119.3 kg). NED. A&Ox3.      Cystoscopy Procedure Note  Patient identification was confirmed, informed consent was obtained, and patient was prepped using Betadine solution.  Lidocaine jelly was administered per urethral meatus.    Preoperative abx where received prior to procedure.     Pre-Procedure: - Inspection reveals a normal caliber urethral meatus.  Procedure: The flexible cystoscope was introduced without difficulty - No urethral strictures/lesions are present. -  Moderate lateral lobe enlargement with moderate median lobe  prostate  - Elevated bladder neck - Bilateral ureteral orifices identified - Bladder mucosa  reveals no ulcers, tumors, or lesions - No bladder stones -Moderate trabeculation  Retroflexion shows no intravesical median lobe   Post-Procedure: - Patient tolerated the procedure well   Prostate transrectal ultrasound sizing   Informed consent was obtained after discussing risks/benefits of the procedure.  A time out was performed to ensure correct patient identity.   Pre-Procedure: -Transrectal probe was placed without difficulty -Transrectal Ultrasound performed revealing a 53 gm prostate measuring 4.55 x 4.06 x 5.44 cm (length) -No significant hypoechoic or median lobe noted      Assessment/ Plan: Moderate lateral lobe enlargement with moderate median lobe We discussed additional medical management with the addition of a 5-ARI inhibitor though he was informed this medication would take at least 4-6 months to determine efficacy Surgical  options were discussed including TURP, PVP.  Minimally invasive options were also discussed and he would be a candidate for UroLift.  He was also informed of the availability of PAE.  He is interested in pursuing UroLift and was provided literature.  He will call back if he has any questions or desires to schedule Will need cardiology clearance   [name], MD

## 2021-11-19 ENCOUNTER — Other Ambulatory Visit: Payer: Self-pay | Admitting: Family Medicine

## 2021-11-19 ENCOUNTER — Other Ambulatory Visit: Payer: Self-pay

## 2021-11-19 ENCOUNTER — Other Ambulatory Visit (HOSPITAL_COMMUNITY): Payer: Self-pay | Admitting: Cardiology

## 2021-11-19 MED ORDER — TACROLIMUS 1 MG PO CAPS
ORAL_CAPSULE | ORAL | 5 refills | Status: DC
  Filled 2021-11-19: qty 120, 30d supply, fill #0
  Filled 2022-01-01: qty 120, 30d supply, fill #1
  Filled 2022-01-31: qty 120, 30d supply, fill #2
  Filled 2022-03-01: qty 120, 30d supply, fill #3

## 2021-11-20 ENCOUNTER — Other Ambulatory Visit: Payer: Self-pay

## 2021-11-20 NOTE — Progress Notes (Unsigned)
11/21/2021 4:54 PM   [name] May 30, 1960 [id number]  Referring provider: [name], MD [address] [place],  [place] [address]  Urological history: 1.Undesired fertility -vasectomy 2016  2. Testosterone deficiency -contributing factors of age, diabetes and obesity -testosterone level (11/01/2021) 202 and (10/03/2021) 168 -HCT/Hemoglobin (10/2021) 32.4/9.6  3. BPH w/ LU TS - cysto (10/2021) Moderate lateral lobe enlargement with moderate median lobe  prostate - Elevated bladder neck -Moderate trabeculation - TRUS (10/2021) Transrectal Ultrasound performed revealing a 53 gm prostate measuring 4.55 x 4.06 x 5.44 cm (length) - I PSS 26/6 - PVR 86 mL  4. ED -contributing factors of age, BPH, CAD, DM, history of smoking, testosterone deficiency, depression, obesity, HLD, CKD, sleep apnea and HTN -SHIM 10 -tadalafil 20 mg, daily   5. Prostate cancer screening -PSA (10/2021) 1.8 -considering TRT  -baseline PSA 0.68 (2014) at age 42  6. Renal transplant -DDRT 08/2010 - followed at [facility] transplant clinic - last seen 10/2021 -non-contrast CT 10/2021 - Severe bilateral renal atrophy is noted consistent with end-stage renal disease. No hydronephrosis or renal obstruction is noted. Urinary bladder is unremarkable. Renal transplant is noted in the right lower quadrant without definite evidence of hydronephrosis.  7. Scrotal mass -scrotal US (10/2021) - 6.2 x 2.6 x 5.7 mm extratesticular tunical lesion at the anteromedial aspect of the superior pole of the right testicle, appears mostly if not completely solid and could be a complex tunical cyst or granuloma. Other etiologies are possible. Consider 6-12 month follow-up ultrasound to ensure stability. There is no positive color flow.  8. Varicocele -scrotal US (10/2021) On the left, there is a tangle of multiple ectatic veins in the lateral hemiscrotum measuring up to 4.1 mm with Valsalva consistent with  a varicocele.  9. Hydroceles -scrotal US (10/2021) Small scrotal hydroceles, both with floating debris.  10. Tubular ectasia -scrotal US (10/2021) Left-greater-than-right tubular ectasia of the rete testis.  Chief Complaint  Patient presents with   Benign Prostatic Hypertrophy   Erectile Dysfunction   Urinary Incontinence    HPI: [name]. is a 61 y.o. male who presents today for incontinence after cysto and low testosterone.   He has been noticing a decrease in his muscle mass, a lack of being able to focus and loss of libido.  He has met criteria for testosterone deficiency with 2 testosterone's below 300.  He states that since undergoing the cystoscopy on November 14, 2021, he has been having urge incontinence.  It is slowly starting to taper off.  He also experienced dysuria a couple days after the procedure.  Patient denies any modifying or aggravating factors.  Patient denies any gross hematuria, dysuria or suprapubic/flank pain.  Patient denies any fevers, chills, nausea or vomiting.    UA benign.    PVR 86 mL    IPSS     Row Name 11/21/21 1300         International Prostate Symptom Score   How often have you had the sensation of not emptying your bladder? Almost always     How often have you had to urinate less than every two hours? Almost always     How often have you found you stopped and started again several times when you urinated? Almost always     How often have you found it difficult to postpone urination? More than half the time     How often have you had a weak urinary stream?  Less than 1 in 5 times     How often have you had to strain to start urination? Less than 1 in 5 times     How many times did you typically get up at night to urinate? 5 Times     Total IPSS Score 26       Quality of Life due to urinary symptoms   If you were to spend the rest of your life with your urinary condition just the way it is now how would you feel about that?  Terrible              Score:  1-7 Mild 8-19 Moderate 20-35 Severe   Patient is not having spontaneous erections.  He denies any pain or curvature with erections.   He is taking tadalafil 20 mg daily for his pulmonary HTN, but he has not seen any improvement in his erectile dysfunction.  SHIM     Row Name 11/21/21 1317         SHIM: Over the last 6 months:   How do you rate your confidence that you could get and keep an erection? Low     When you had erections with sexual stimulation, how often were your erections hard enough for penetration (entering your partner)? A Few Times (much less than half the time)     During sexual intercourse, how often were you able to maintain your erection after you had penetrated (entered) your partner? A Few Times (much less than half the time)     During sexual intercourse, how difficult was it to maintain your erection to completion of intercourse? Very Difficult     When you attempted sexual intercourse, how often was it satisfactory for you? A Few Times (much less than half the time)       SHIM Total Score   SHIM 10              Score: 1-7 Severe ED 8-11 Moderate ED 12-16 Mild-Moderate ED 17-21 Mild ED 22-25 No ED    PMH: Past Medical History:  Diagnosis Date   Asthma, persistent controlled 02/25/2013   Attention or concentration deficit 04/26/2021   BPH with obstruction/lower urinary tract symptoms 09/25/2014   CAD (coronary artery disease) 2005   a.  Status post four-vessel CABG in 2005; b. 03/2018 MV: Small, fixed inferoapical and apical septal defect. No ischemia. EF 47% (60-65% by 05/2018 Echo).   Cellulitis and abscess of left leg 07/22/2020   Chronic atrial fibrillation    a. CHADS2VASc 4 (CHF, HTN, DM, vascular disease)--> Eliquis; b. 09/2018 Zio: Chronic Afib.   Chronic combined systolic and diastolic CHF (congestive heart failure)    a.  7/18 Echo: EF 45-50%; b. 05/2018 Echo: EF 60-65%, RVSP 74.8; c. 12/2018 Echo: EF  50-55%. Sev dil LA; d. 04/2019 Echo: EF 45-50%, glob HK. Mod reduced RV fxn. RVSP 53.39mHg.   Chronic venous stasis dermatitis of both lower extremities    Cytomegaloviral disease 2017   Diverticulosis of colon 04/22/2013   Essential hypertension 12/17/2010   GERD (gastroesophageal reflux disease)    Gout attack 03/30/2018   History of renal transplant 03/01/2013   Hyperlipidemia    low since renal failure   Hypogonadism in male 09/25/2014   Major depressive disorder 10/04/2013   Obesity hypoventilation syndrome 03/31/2012   Obstructive sleep apnea 09/29/2012   NPSG [place] 03/15/12- AHI 57.9/ hr, desaturation to 78.8%, body weight 310 lbs   PAH (pulmonary artery hypertension)  a. 05/2018 Echo: RVSP 74.6mHg; b. 12/2018 RV not well visualized; c. 04/2019 RHC: RA 19, RV 80/20, PA 78/31 (51), PCWP 25, CO/CI 7.69/3.26. PVR 3.25 WU-->Sev PAH, likely primarily PV HTN.   Synovitis of finger 06/11/2019   Trigger finger, unspecified little finger 12/23/2018   Type 2 diabetes mellitus with hyperglycemia, with long-term current use of insulin 09/09/2014   Ulcer 05/2016   Left shin   Wears glasses     Surgical History: Past Surgical History:  Procedure Laterality Date   BARIATRIC SURGERY     [facility]  [facility]   CATARACT EXTRACTION W/PHACO Right 10/29/2017   Procedure: CATARACT EXTRACTION PHACO AND INTRAOCULAR LENS PLACEMENT ([place]  RIGHT DIABETIC;  Surgeon: BLeandrew Koyanagi MD;  [place]  Service: Ophthalmology;  Laterality: Right;  Diabetic - insulin   CATARACT EXTRACTION W/PHACO Left 11/18/2017   Procedure: CATARACT EXTRACTION PHACO AND INTRAOCULAR LENS PLACEMENT ([place] LEFT IVA/TOPICAL;  Surgeon: BLeandrew Koyanagi MD;  [place]  Service: Ophthalmology;  Laterality: Left;  DIABETES - insulin sleep apnea   COLONOSCOPY WITH PROPOFOL N/A 04/22/2013   Procedure: COLONOSCOPY WITH PROPOFOL;   Surgeon: DMilus Banister MD;  [facility];  Service: Endoscopy;  Laterality: N/A;   CORONARY ARTERY BYPASS GRAFT  2005   X 4   DG ANGIO AV SHUNT*L*     right and left upper arms   FASCIOTOMY  03/03/2012   Procedure: FASCIOTOMY;  Surgeon: GWynonia Sours MD;  [place]  Service: Orthopedics;  Laterality: Right;  FASCIOTOMY RIGHT SMALL FINGER   FASCIOTOMY Left 08/17/2013   Procedure: FASCIOTOMY LEFT RING;  Surgeon: GWynonia Sours MD;  [place]  Service: Orthopedics;  Laterality: Left;   INCISION AND DRAINAGE ABSCESS Left 10/15/2015   Procedure: INCISION AND DRAINAGE ABSCESS;  Surgeon: DJules Husbands MD;  Location: [facility];  Service: General;  Laterality: Left;   KIDNEY TRANSPLANT  [facility]/13/2010   [facility]A 11/15/2016   Procedure: RIGHT HEART CATH;  Surgeon: AWellington Hampshire MD;  [place];  Service: Cardiovascular;  Laterality: N/A;   RIGHT HEART CATH N/A 05/03/2019   Procedure: RIGHT HEART CATH;  Surgeon: MLarey Dresser MD;  [place] [facility];  Service: Cardiovascular;  Laterality: N/A;   TYMPANIC MEMBRANE REPAIR  1/12   left   VASECTOMY      Home Medications:  Allergies as of 11/21/2021       Reactions   Contrast Media [iodinated Contrast Media] Other (See Comments)   Kidney transplant   Iodine Other (See Comments)   Other reaction(s): Other (See Comments) Kidney transplant   Ibuprofen Other (See Comments)   Due to kidney transplant Due to kidney transplant Due to kidney transplant        Medication List        Accurate as of November 21, 2021  4:54 PM. If you have any questions, ask your nurse or doctor.          albuterol 108 (90 Base) MCG/ACT inhaler Commonly known as: VENTOLIN HFA Inhale 2 puffs into the lungs every 6 (six) hours as needed for wheezing or shortness of breath.   albuterol (2.5 MG/3ML) 0.083% nebulizer solution Commonly  known as: PROVENTIL Inhale one vial (3 ml) via nebulizer every 6 hours as needed for wheezing or shortness of breath (Take 3 mLs (  2.5 mg total) by nebulization every 6 (six) hours as needed for wheezing or shortness of breath.)   allopurinol 100 MG tablet Commonly known as: ZYLOPRIM Take 100 mg by mouth daily.   beclomethasone 80 MCG/ACT inhaler Commonly known as: QVAR Inhale 2 puffs into the lungs 2 (two) times daily.   buPROPion 150 MG 12 hr tablet Commonly known as: WELLBUTRIN SR TAKE 1 TABLET BY MOUTH TWICE DAILY   cholecalciferol 25 MCG (1000 UNIT) tablet Commonly known as: VITAMIN D3 Take 1,000 Units by mouth at bedtime.   colchicine 0.6 MG tablet Take 1 tablet (0.6 mg total) by mouth every other day   dapagliflozin propanediol 10 MG Tabs tablet Commonly known as: Farxiga Take 1 tablet (10 mg total) by mouth daily before breakfast.   Eliquis 5 MG Tabs tablet Generic drug: apixaban TAKE 1 TABLET BY MOUTH TWICE DAILY   Febuxostat 80 MG Tabs Take one tablet by mouth once daily (Take 1 tablet (80 mg total) by mouth once daily for 180 days)   Ferrex 150 150 MG capsule Generic drug: iron polysaccharides TAKE 1 CAPSULE BY MOUTH 2 TIMES DAILY.   fluticasone 50 MCG/ACT nasal spray Commonly known as: FLONASE PLACE 2 SPRAYS INTO BOTH NOSTRILS DAILY.   FreeStyle Libre 3 Sensor Misc Use one every 2 weeks.   gabapentin 300 MG capsule Commonly known as: NEURONTIN TAKE 1 CAPSULE BY MOUTH TWICE DAILY   HumaLOG KwikPen 100 UNIT/ML KwikPen Generic drug: insulin lispro INJECT UP TO 50 UNITS UNDER THE SKIN DAILY AS DIRECTED (INJECT UP TO 50 UNITS UNDER THE SKIN DAILY AS DIRECTED)   hydrALAZINE 25 MG tablet Commonly known as: APRESOLINE Take 1/2 tablet (12.5 mg total) by mouth every 8 (eight) hours.   hydrocortisone 2.5 % cream Apply topically 3 (three) times daily as needed.   lidocaine 5 % Commonly known as: LIDODERM Place 1 patch onto the skin daily. Remove &  Discard patch within 12 hours or as directed by MD   metolazone 2.5 MG tablet Commonly known as: ZAROXOLYN Take 1 tablet (2.5 mg total) by mouth 3 (three) times a week. Monday, Wednesday Friday   montelukast 10 MG tablet Commonly known as: SINGULAIR TAKE 1 TABLET BY MOUTH AT BEDTIME.   multivitamin with minerals Tabs tablet Take 1 tablet by mouth daily.   mycophenolate 180 MG EC tablet Commonly known as: MYFORTIC Take 360 mg by mouth 2 (two) times daily.   omeprazole 20 MG capsule Commonly known as: PRILOSEC TAKE 1 CAPSULE BY MOUTH DAILY.   Potassium Chloride CR 8 MEQ Cpcr capsule CR Commonly known as: MICRO-K Take 13 capsules (104 mEq total) by mouth 2 (two) times daily.   predniSONE 5 MG tablet Commonly known as: DELTASONE Take 1 tablet (5 mg total) by mouth daily.   rosuvastatin 10 MG tablet Commonly known as: CRESTOR Take 1 tablet (10 mg total) by mouth daily.   Semglee (yfgn) 100 UNIT/ML Pen Generic drug: insulin glargine-yfgn Inject 40 Units subcutaneously once daily   sulfamethoxazole-trimethoprim 400-80 MG tablet Commonly known as: BACTRIM TAKE 1 TABLET BY MOUTH EVERY MONDAY, WEDNESDAY, FRIDAY.   tacrolimus 1 MG capsule Commonly known as: PROGRAF Take 2 capsules (2 mg total) by mouth 2 times daily.   tadalafil (PAH) 20 MG tablet Commonly known as: ADCIRCA Take 1 tablet (20 mg total) by mouth daily.   tamsulosin 0.4 MG Caps capsule Commonly known as: FLOMAX TAKE 1 CAPSULE BY MOUTH DAILY.   torsemide 100 MG tablet Commonly known as: DEMADEX  Take 1 tablet (100 mg total) by mouth 2 (two) times daily.   traMADol 50 MG tablet Commonly known as: ULTRAM Take 1 tablet (50 mg total) by mouth every 6 (six) hours as needed for moderate pain or severe pain.   Unifine Pentips 31G X 5 MM Misc Generic drug: Insulin Pen Needle Use 4 times daily as directed   zinc sulfate 220 (50 Zn) MG capsule Take 220 mg by mouth daily.        Allergies:  Allergies   Allergen Reactions   Contrast Media [Iodinated Contrast Media] Other (See Comments)    Kidney transplant   Iodine Other (See Comments)    Other reaction(s): Other (See Comments) Kidney transplant   Ibuprofen Other (See Comments)    Due to kidney transplant Due to kidney transplant Due to kidney transplant    Family History: Family History  Problem Relation Age of Onset   Heart disease Father    Kidney failure Father    Kidney disease Father    Diabetes Maternal Grandmother    Breast cancer Maternal Grandmother    Valvular heart disease Mother    Liver cancer Paternal Uncle    Liver cancer Paternal Grandmother    Prostate cancer Neg Hx     Social History:  reports that he quit smoking about 27 years ago. His smoking use included cigars. He has never been exposed to tobacco smoke. He has never used smokeless tobacco. He reports current alcohol use. He reports that he does not use drugs.  ROS: Pertinent ROS in HPI  Physical Exam: BP (!) 163/80   Pulse 77   Ht _0  (1.778 m)   Wt 251 lb (113.9 kg)   BMI 36.01 kg/m   Constitutional:  Well nourished. Alert and oriented, No acute distress. HEENT: [place] AT, moist mucus membranes.  Trachea midline Cardiovascular: No clubbing, cyanosis, or edema. Respiratory: Normal respiratory effort, no increased work of breathing. Neurologic: Grossly intact, no focal deficits, moving all 4 extremities. Psychiatric: Normal mood and affect.  Laboratory Data: Lab Results  Component Value Date   WBC 3.6 (L) 11/03/2021   HGB 9.6 (L) 11/03/2021   HCT 32.4 (L) 11/03/2021   MCV 83.1 11/03/2021   PLT 120 (L) 11/03/2021    Lab Results  Component Value Date   CREATININE 2.36 (H) 11/12/2021    Lab Results  Component Value Date   PSA 1.28 10/05/2019   PSA 0.68 10/20/2012    Lab Results  Component Value Date   TESTOSTERONE 202 (L) 11/01/2021    Lab Results  Component Value Date   HGBA1C 6.1 (H) 11/02/2021       Component Value  Date/Time   CHOL 68 11/02/2021 1118   CHOL 161 03/07/2015 0823   HDL 31 (L) 11/02/2021 1118   HDL 38 (L) 03/07/2015 0823   CHOLHDL 2.2 11/02/2021 1118   VLDL 15 11/02/2021 1118   LDLCALC 22 11/02/2021 1118   LDLCALC 105 (H) 03/07/2015 0823    Lab Results  Component Value Date   AST 16 11/04/2021   Lab Results  Component Value Date   ALT 10 11/04/2021    Urinalysis See Epic and HPI I have reviewed the labs.   Pertinent Imaging:  11/21/21 13:12  Scan Result 25m     Assessment & Plan:    1. Incontinence -UA benign  -urine sent for culture to rule out subclinical infection -Explained that his urinary symptoms may be due to the fact that the cystoscope  has temporarily dilated his urethra and his bladder may be used to working against an obstructing prostate and therefore has not adjusted to his temporary new anatomy.  I did reassure the patient this will be likely short-lived as the urethra will likely go back to its original shape -He is interested in pursuing a UroLift at this time, so we will go ahead and obtain cardiac clearance for this procedure  2. Testosterone deficiency -Significant symptoms of muscle loss, lack of concentration and low libido -Recommend starting TRT -We discussed the most common forms of replacement including intramuscular injection and gels and he desires to start injections -We will obtain cardiac clearance prior to starting treatment and he is interested in AVEED -Follow-up 6 weeks after starting TRT for testosterone level and symptom check -Potential side effects of testosterone replacement were discussed including stimulation of benign prostatic growth with lower urinary tract symptoms; erythrocytosis; edema; gynecomastia; worsening sleep apnea; venous thromboembolism; testicular atrophy and infertility. Recent studies suggesting an increased incidence of heart attack and stroke in patients taking testosterone was discussed. He was informed  there is conflicting evidence regarding the impact of testosterone therapy on cardiovascular risk. The theoretical risk of growth stimulation of an undetected prostate cancer was also discussed.  He was informed that current evidence does not provide any definitive answers regarding the risks of testosterone therapy on prostate cancer and cardiovascular disease. The need for periodic monitoring of his testosterone level, PSA, hematocrit and DRE was discussed.   3. ED -Continue tadalafil 20 mg daily    Return for pending cardiac clearance for AVEED .  These notes generated with voice recognition software. I apologize for typographical errors.  [place], [place] [address]  [place], [place] [address] [phone]

## 2021-11-21 ENCOUNTER — Encounter: Payer: Self-pay | Admitting: Urology

## 2021-11-21 ENCOUNTER — Telehealth: Payer: Self-pay

## 2021-11-21 ENCOUNTER — Other Ambulatory Visit: Payer: Self-pay

## 2021-11-21 ENCOUNTER — Telehealth: Payer: Self-pay | Admitting: Cardiovascular Disease

## 2021-11-21 ENCOUNTER — Other Ambulatory Visit: Payer: Self-pay | Admitting: Urology

## 2021-11-21 ENCOUNTER — Ambulatory Visit (INDEPENDENT_AMBULATORY_CARE_PROVIDER_SITE_OTHER): Payer: 59 | Admitting: Urology

## 2021-11-21 VITALS — BP 163/80 | HR 77 | Ht 70.0 in | Wt 251.0 lb

## 2021-11-21 DIAGNOSIS — E291 Testicular hypofunction: Secondary | ICD-10-CM | POA: Diagnosis not present

## 2021-11-21 DIAGNOSIS — N529 Male erectile dysfunction, unspecified: Secondary | ICD-10-CM | POA: Diagnosis not present

## 2021-11-21 DIAGNOSIS — N401 Enlarged prostate with lower urinary tract symptoms: Secondary | ICD-10-CM

## 2021-11-21 DIAGNOSIS — E349 Endocrine disorder, unspecified: Secondary | ICD-10-CM

## 2021-11-21 DIAGNOSIS — R32 Unspecified urinary incontinence: Secondary | ICD-10-CM | POA: Diagnosis not present

## 2021-11-21 DIAGNOSIS — I200 Unstable angina: Secondary | ICD-10-CM

## 2021-11-21 DIAGNOSIS — N138 Other obstructive and reflux uropathy: Secondary | ICD-10-CM | POA: Diagnosis not present

## 2021-11-21 LAB — URINALYSIS, COMPLETE
Bilirubin, UA: NEGATIVE
Ketones, UA: NEGATIVE
Leukocytes,UA: NEGATIVE
Nitrite, UA: NEGATIVE
Protein,UA: NEGATIVE
RBC, UA: NEGATIVE
Specific Gravity, UA: <1.005 — ABNORMAL LOW (ref 1.005–1.030)
Urobilinogen, Ur: 0.2 mg/dL (ref 0.2–1.0)
pH, UA: 5.5 (ref 5.0–7.5)

## 2021-11-21 LAB — MICROSCOPIC EXAMINATION: Bacteria, UA: NONE SEEN

## 2021-11-21 LAB — BLADDER SCAN AMB NON-IMAGING

## 2021-11-21 NOTE — Progress Notes (Signed)
Surgical Physician Order Form [facility] [place]  * Scheduling expectation : Next Available w/ Dr. Bernardo Heater   *Length of Case:   *Clearance needed: yes  *Anticoagulation Instructions: Hold all anticoagulants - cardiologist is [name]   *Aspirin Instructions: Hold Aspirin  *Post-op visit Date/Instructions:   TBD  *Diagnosis: BPH w/urinary obstruction  *Procedure: cystoscopy with UroLift   Additional orders: N/A  -Admit type: OUTpatient  -Anesthesia: General  -VTE Prophylaxis Standing Order SCD's       Other:   -Standing Lab Orders Per Anesthesia    Lab other: UA&Urine Culture  -Standing Test orders EKG/Chest x-ray per Anesthesia       Test other:   - Medications:  Ancef 1gm IV  -Other orders:  N/A

## 2021-11-21 NOTE — Telephone Encounter (Signed)
Pre-operative Risk Assessment    Patient Name: [name].  DOB: [birth year] MRN: [id number]      Request for Surgical Clearance    Procedure:  N/A  Date of Surgery:  Clearance TBD                                 Surgeon:  not listed Surgeon's Group or Practice Name:  [facility] Phone number:  [phone] Fax number:  [phone]   Type of Clearance Requested:   Pharmacy, Aveed   Type of Anesthesia:  None    Additional requests/questions:    Signed, Pilar A Ham   11/21/2021, 3:43 PM

## 2021-11-21 NOTE — Telephone Encounter (Signed)
Please clarify with surgeon what procedure, what request is needed? Richardson Dopp, PA-C    11/21/2021 6:28 PM

## 2021-11-21 NOTE — Telephone Encounter (Signed)
REQUEST FOR CARDIAC CLEARANCE       Date: _0 @  Request Clearance from Dr. Ozzie Hoyle to: [phone]  Procedure: Medication start  Date of Procedure: TBD  Provider: Zara Council    Cardiac  Clearance : Yes  Reason: cardiac clearance for pt to start AVEED (testosterone replacement)

## 2021-11-22 NOTE — Telephone Encounter (Signed)
*  Procedure: cystoscopy with UroLift  -Anesthesia: General *Aspirin Instructions: Hold Aspirin

## 2021-11-25 DIAGNOSIS — G4733 Obstructive sleep apnea (adult) (pediatric): Secondary | ICD-10-CM | POA: Diagnosis not present

## 2021-11-25 DIAGNOSIS — E662 Morbid (severe) obesity with alveolar hypoventilation: Secondary | ICD-10-CM | POA: Diagnosis not present

## 2021-11-27 ENCOUNTER — Encounter: Payer: Self-pay | Admitting: Nurse Practitioner

## 2021-11-27 ENCOUNTER — Ambulatory Visit: Payer: 59 | Attending: Nurse Practitioner | Admitting: Nurse Practitioner

## 2021-11-27 LAB — CULTURE, URINE COMPREHENSIVE

## 2021-11-27 NOTE — Progress Notes (Deleted)
Office Visit    Patient Name: [name]. Date of Encounter: 11/27/2021  Primary Care Provider:  [name], MD Primary Cardiologist:  [name], MD  Chief Complaint    61 year old male with a history of CAD status post CABG 1610, chronic systolic or diastolic congestive heart failure (EF 40-45%), ischemic cardiomyopathy, pulmonary hypertension, hyperlipidemia, permanent atrial fibrillation on Eliquis, pulmonary arterial and venous hypertension, end-stage renal disease status post renal transplant, anemia of chronic disease, type 2 diabetes mellitus, hypertension, hyperlipidemia, moderate aortic stenosis, obesity status post gastric bypass, obstructive sleep apnea, and beta-blocker induced bradycardia, who presents for preoperative cardiovascular evaluation with pending ***  Past Medical History    Past Medical History:  Diagnosis Date   Asthma, persistent controlled 02/25/2013   Attention or concentration deficit 04/26/2021   BPH with obstruction/lower urinary tract symptoms 09/25/2014   CAD (coronary artery disease) 2005   a.  Status post four-vessel CABG in 2005; b. 03/2018 MV: Small, fixed inferoapical and apical septal defect. No ischemia. EF 47% (60-65% by 05/2018 Echo); c. 02/2021 MV: small Apical inf fixed defect. No ischemia-->low risk.   Cellulitis and abscess of left leg 07/22/2020   Chronic atrial fibrillation    a. CHADS2VASc 4 (CHF, HTN, DM, vascular disease)--> Eliquis; b. 09/2018 [facility]: Chronic Afib.   Chronic combined systolic and diastolic CHF (congestive heart failure)    a.  7/18 Echo: EF 45-50%; b. 05/2018 Echo: EF 60-65%, RVSP 74.8; c. 12/2018 Echo: EF 50-55%. Sev dil LA; d. 04/2019 Echo: EF 45-50%; e. 07/2021 Echo: EF 40-45%, GrIII DD; f. 10/2021 Echo: EF 40-45%, mild conc LVH, septal-lat dyssynchrony (LBBB), mildly reduced RV fxn, mild-mod dil LA, mildly dil RA, mild-mod MS, mod AS.   Chronic venous stasis dermatitis of both lower extremities     Cytomegaloviral disease 2017   Diverticulosis of colon 04/22/2013   Essential hypertension 12/17/2010   GERD (gastroesophageal reflux disease)    Gout attack 03/30/2018   History of renal transplant 03/01/2013   Hyperlipidemia    low since renal failure   Hypogonadism in male 09/25/2014   Major depressive disorder 10/04/2013   Moderate aortic stenosis    a. 10/2021 Echo: Mod AS. AVA 1.06cm^2 (VTI). Mean grad 41mHg.   Obesity hypoventilation syndrome 03/31/2012   Obstructive sleep apnea 09/29/2012   NPSG [place] 03/15/12- AHI 57.9/ hr, desaturation to 78.8%, body weight 310 lbs   PAH (pulmonary artery hypertension)    a. 04/2019 [facility]: RA 19, RV 80/20, PA 78/31 (51), PCWP 25, CO/CI 7.69/3.26. PVR 3.25 WU-->Sev PAH, likely primarily PV HTN.   Synovitis of finger 06/11/2019   Trigger finger, unspecified little finger 12/23/2018   Type 2 diabetes mellitus with hyperglycemia, with long-term current use of insulin 09/09/2014   Ulcer 05/2016   Left shin   Wears glasses    Past Surgical History:  Procedure Laterality Date   [place]IATRIC [place] 05/03/[place]   CATARACT EXTRACTION W/PHACO Right 10/29/2017   Procedure: CATARACT EXTRACTION PHACO AND INTRAOCULAR LENS PLACEMENT ([place]  RIGHT DIABETIC;  Surgeon: BLeandrew Koyanagi MD;  [place] [facility]  Service: Ophthalmology;  Laterality: Right;  Diabetic - insulin   CATARACT EXTRACTION W/PHACO Left 11/18/2017   Procedure: CATARACT EXTRACTION PHACO AND INTRAOCULAR LENS PLACEMENT ([place] IV[place]/TOPICAL;  Surgeon: BLeandrew Koyanagi MD;  [place] [facility]  Service: Ophthalmology;  Laterality: Left;  DIABETES - insulin sleep apnea  COLONOSCOPY WITH PROPOFOL N/A 04/22/2013   Procedure: COLONOSCOPY WITH PROPOFOL;  Surgeon: [name], MD;  Location: [facility];  Service: Endoscopy;  Laterality: N/A;   CORONARY ARTERY BYPASS GRAFT  2005   X 4   DG ANGIO AV  SHUNT*L*     right and left upper arms   FASCIOTOMY  03/03/2012   Procedure: FASCIOTOMY;  Surgeon: [name], MD;  Location: [place];  Service: Orthopedics;  Laterality: Right;  FASCIOTOMY RIGHT SMALL FINGER   FASCIOTOMY Left 08/17/2013   Procedure: FASCIOTOMY LEFT RING;  Surgeon: [name], MD;  Location: [place];  Service: Orthopedics;  Laterality: Left;   INCISION AND DRAINAGE ABSCESS Left 10/15/2015   Procedure: INCISION AND DRAINAGE ABSCESS;  Surgeon: [name], MD;  Location: [facility];  Service: General;  Laterality: Left;   KIDNEY TRANSPLANT  09/13/2010   cadaver--at [facility] HEART CATH N/A 11/15/2016   Procedure: RIGHT HEART CATH;  Surgeon: [name], MD;  Location: [facility];  Service: Cardiovascular;  Laterality: N/A;   RIGHT HEART CATH N/A 05/03/2019   Procedure: RIGHT HEART CATH;  Surgeon: [name], MD;  Location: [facility] [place] [facility];  Service: Cardiovascular;  Laterality: N/A;   TYMPANIC MEMBRANE REPAIR  1/12   left   VASECTOMY      Allergies  Allergies  Allergen Reactions   Contrast Media [Iodinated Contrast Media] Other (See Comments)    Kidney transplant   Iodine Other (See Comments)    Other reaction(s): Other (See Comments) Kidney transplant   Ibuprofen Other (See Comments)    Due to kidney transplant Due to kidney transplant Due to kidney transplant    History of Present Illness    61 year old male with a history of CAD status post CABG 1572, chronic systolic or diastolic congestive heart failure (EF 40-45%), ischemic cardiomyopathy, pulmonary hypertension, hyperlipidemia, permanent atrial fibrillation on Eliquis, pulmonary arterial and venous hypertension, end-stage renal disease status post renal transplant, anemia of chronic disease, type 2 diabetes mellitus, hypertension, hyperlipidemia, moderate aortic stenosis, obesity status post gastric bypass, obstructive sleep apnea, and  beta-blocker induced bradycardia.  As noted, he is status post four-vessel bypass 2005 and nonischemic stress testing in January 2020.  EF previously as low as 35 to 40% with slight improvement in July 2000 18-40 5-50%.  Right heart catheterization in August 2018 in the setting of admission for volume overload showed significant pulmonary hypertension with a PA pressure of 94/38 (59).  Pulmonary hypertension has been managed with taldalafil, and he has been followed in the advanced heart failure clinic in [place].  In December 2022, he reported occasional chest tightness.  Stress testing was undertaken and was overall similar to his 2020 study, showing a small apical inferior fixed defect without ischemia.  In the spring 2023, he required discontinuation of metolazone secondary to rising creatinine.  In May 2023, Mr. Dorvil was admitted with recurrent heart failure.  In the emergency department, he was hyperkalemic with a potassium of 7.2.  He was treated with Lokelma, insulin, albuterol, and IV Lasix.  Home doses of losartan, Aldactone, and Wilder Glade were held.  Echocardiogram showed stable LV dysfunction with an EF of 40 to 45% and grade 3 diastolic dysfunction.  He was successfully diuresed and transition back to oral torsemide.  Wilder Glade was resumed.  Unfortunately, he required readmission in early August 2011 in the setting of chest pain.  Troponin was mildly elevated with a  flat pattern, and was not felt to be consistent with acute coronary syndrome.  He was conservatively managed and subsequently discharged.  He followed up in heart failure clinic on August 16, at which time he was felt to be mildly volume overloaded.  He was maintained on torsemide 20 mg twice daily and metolazone was increased to 2.5 mg 3 times a week.  Echocardiogram again showed stable LV dysfunction with an EF of 40 to 45% and moderate concentric LVH.  Moderate aortic stenosis was also noted.  Follow-up lab work on August 21 showed  slight rise in creatinine to 2.36 from 2.02, though overall this was stable and no changes were made.  Home Medications    Current Outpatient Medications  Medication Sig Dispense Refill   albuterol (PROVENTIL) (2.5 MG/3ML) 0.083% nebulizer solution Take 3 mLs (2.5 mg total) by nebulization every 6 (six) hours as needed for wheezing or shortness of breath. 150 mL 0   albuterol (VENTOLIN HFA) 108 (90 Base) MCG/ACT inhaler Inhale 2 puffs into the lungs every 6 (six) hours as needed for wheezing or shortness of breath. 18 g 5   allopurinol (ZYLOPRIM) 100 MG tablet Take 100 mg by mouth daily.     apixaban (ELIQUIS) 5 MG TABS tablet TAKE 1 TABLET BY MOUTH TWICE DAILY 180 tablet 1   beclomethasone (QVAR) 80 MCG/ACT inhaler Inhale 2 puffs into the lungs 2 (two) times daily. 10.6 g 5   buPROPion (WELLBUTRIN SR) 150 MG 12 hr tablet TAKE 1 TABLET BY MOUTH TWICE DAILY 180 tablet 3   cholecalciferol (VITAMIN D3) 25 MCG (1000 UNIT) tablet Take 1,000 Units by mouth at bedtime.     colchicine 0.6 MG tablet Take 1 tablet (0.6 mg total) by mouth every other day 30 tablet 1   Continuous Blood Gluc Sensor (FREESTYLE LIBRE 3 SENSOR) MISC Use one every 2 weeks. 6 each 3   dapagliflozin propanediol (FARXIGA) 10 MG TABS tablet Take 1 tablet (10 mg total) by mouth daily before breakfast. 30 tablet 11   Febuxostat 80 MG TABS Take 1 tablet (80 mg total) by mouth once daily for 180 days 30 tablet 5   fluticasone (FLONASE) 50 MCG/ACT nasal spray PLACE 2 SPRAYS INTO BOTH NOSTRILS DAILY. 16 g 11   gabapentin (NEURONTIN) 300 MG capsule TAKE 1 CAPSULE BY MOUTH TWICE DAILY 180 capsule 0   hydrALAZINE (APRESOLINE) 25 MG tablet Take 1/2 tablet (12.5 mg total) by mouth every 8 (eight) hours. 45 tablet 0   hydrocortisone 2.5 % cream Apply topically 3 (three) times daily as needed. 30 g 3   insulin glargine-yfgn (SEMGLEE) 100 UNIT/ML Pen Inject 40 Units subcutaneously once daily 45 mL 3   insulin lispro (HUMALOG KWIKPEN) 100 UNIT/ML  KwikPen INJECT UP TO 50 UNITS UNDER THE SKIN DAILY AS DIRECTED 45 mL 3   Insulin Pen Needle (UNIFINE PENTIPS) 31G X 5 MM MISC Use 4 times daily as directed 400 each 3   iron polysaccharides (FERREX 150) 150 MG capsule TAKE 1 CAPSULE BY MOUTH 2 TIMES DAILY. 180 capsule 1   lidocaine (LIDODERM) 5 % Place 1 patch onto the skin daily. Remove & Discard patch within 12 hours or as directed by MD 30 patch 0   metolazone (ZAROXOLYN) 2.5 MG tablet Take 1 tablet (2.5 mg total) by mouth 3 (three) times a week. Monday, Wednesday Friday 20 tablet 6   montelukast (SINGULAIR) 10 MG tablet TAKE 1 TABLET BY MOUTH AT BEDTIME. 90 tablet 3   Multiple Vitamin (  MULTIVITAMIN WITH MINERALS) TABS tablet Take 1 tablet by mouth daily.     mycophenolate (MYFORTIC) 180 MG EC tablet Take 360 mg by mouth 2 (two) times daily.     omeprazole (PRILOSEC) 20 MG capsule TAKE 1 CAPSULE BY MOUTH DAILY. 90 capsule 3   Potassium Chloride CR (MICRO-K) 8 MEQ CPCR capsule CR Take 13 capsules (104 mEq total) by mouth 2 (two) times daily. 780 capsule 6   predniSONE (DELTASONE) 5 MG tablet Take 1 tablet (5 mg total) by mouth daily. 90 tablet 3   rosuvastatin (CRESTOR) 10 MG tablet Take 1 tablet (10 mg total) by mouth daily. 90 tablet 3   sulfamethoxazole-trimethoprim (BACTRIM) 400-80 MG tablet TAKE 1 TABLET BY MOUTH EVERY MONDAY, WEDNESDAY, FRIDAY. 36 tablet 3   tacrolimus (PROGRAF) 1 MG capsule Take 2 capsules (2 mg total) by mouth 2 times daily. 120 capsule 5   tadalafil, PAH, (ADCIRCA) 20 MG tablet Take 1 tablet (20 mg total) by mouth daily. 90 tablet 3   tamsulosin (FLOMAX) 0.4 MG CAPS capsule TAKE 1 CAPSULE BY MOUTH DAILY. 90 capsule 3   torsemide (DEMADEX) 100 MG tablet Take 1 tablet (100 mg total) by mouth 2 (two) times daily. 180 tablet 3   traMADol (ULTRAM) 50 MG tablet Take 1 tablet (50 mg total) by mouth every 6 (six) hours as needed for moderate pain or severe pain. 30 tablet 0   zinc sulfate 220 (50 Zn) MG capsule Take 220 mg by  mouth daily.     No current facility-administered medications for this visit.     Review of Systems    ***.  All other systems reviewed and are otherwise negative except as noted above.    Physical Exam    VS:  There were no vitals taken for this visit. , BMI There is no height or weight on file to calculate BMI.     GEN: Well nourished, well developed, in no acute distress. HEENT: normal. Neck: Supple, no JVD, carotid bruits, or masses. Cardiac: RRR, no murmurs, rubs, or gallops. No clubbing, cyanosis, edema.  Radials/DP/PT 2+ and equal bilaterally.  Respiratory:  Respirations regular and unlabored, clear to auscultation bilaterally. GI: Soft, nontender, nondistended, BS + x 4. MS: no deformity or atrophy. Skin: warm and dry, no rash. Neuro:  Strength and sensation are intact. Psych: Normal affect.  Accessory Clinical Findings    ECG personally reviewed by me today - *** - no acute changes.  Lab Results  Component Value Date   WBC 3.6 (L) 11/03/2021   HGB 9.6 (L) 11/03/2021   HCT 32.4 (L) 11/03/2021   MCV 83.1 11/03/2021   PLT 120 (L) 11/03/2021   Lab Results  Component Value Date   CREATININE 2.36 (H) 11/12/2021   BUN 63 (H) 11/12/2021   NA 140 11/12/2021   K 3.5 11/12/2021   CL 99 11/12/2021   CO2 31 11/12/2021   Lab Results  Component Value Date   ALT 10 11/04/2021   AST 16 11/04/2021   ALKPHOS 108 11/04/2021   BILITOT 0.7 11/04/2021   Lab Results  Component Value Date   CHOL 68 11/02/2021   HDL 31 (L) 11/02/2021   LDLCALC 22 11/02/2021   LDLDIRECT 41.9 04/20/2012   TRIG 74 11/02/2021   CHOLHDL 2.2 11/02/2021    Lab Results  Component Value Date   HGBA1C 6.1 (H) 11/02/2021    Assessment & Plan    1.  ***   Murray Hodgkins, NP 11/27/2021, 8:07 AM

## 2021-11-27 NOTE — Telephone Encounter (Signed)
The Aveed testosterone injection can cause/exacerbate hypertension and has been known to cause fluid retention. Some postmarketing studies have shown an increased risk of MACE with use of testosterone replacement therapy but other studies have not found this risk. Additionally, Aveed is a REMS drug due to the risk of serious pulmonary oil microembolism (POME) reactions, involving urge to cough, dyspnea, throat tightening, chest pain, dizziness, and syncope; and episodes of anaphylaxis, including life-threatening reactions, have been reported to occur during or immediately after administration.  Given these known risks, he should be monitored carefully while on therapy, but therapy is likely safe to initiate as long as we are aware of the risks. He has close follow up with [name] in the [facility], next appointment 12/07/21.

## 2021-11-27 NOTE — Telephone Encounter (Signed)
Please see comments from clinical Pharmacist.   Contact our office with further questions.  Emmaline Life, NP-C 11/27/2021, 5:24 PM [address] Office [phone] Fax [phone]

## 2021-11-27 NOTE — Telephone Encounter (Signed)
Called and spoke with surgery scheduler at [facility]. Disregard request regarding cystoscopy.   This request is for patient to start Aveed, hormone replacement, with his current cardiac medications. Routing to Pharm D and primary cardiologist. There is no current request for patient to hold Eliquis or aspirin for an upcoming procedure but that is likely to be forth coming.  Emmaline Life, NP-C    11/27/2021, 12:14 PM [address] Office [phone] Fax [phone]

## 2021-11-28 DIAGNOSIS — E1165 Type 2 diabetes mellitus with hyperglycemia: Secondary | ICD-10-CM | POA: Diagnosis not present

## 2021-11-28 NOTE — Telephone Encounter (Signed)
Pt approved for TRT w/ close monitoring.

## 2021-12-03 ENCOUNTER — Telehealth: Payer: Self-pay | Admitting: Internal Medicine

## 2021-12-03 NOTE — Telephone Encounter (Signed)
Patient called and stated he wants to renew his FMLA paperwork. Call back number [phone]

## 2021-12-04 ENCOUNTER — Other Ambulatory Visit: Payer: Self-pay

## 2021-12-04 ENCOUNTER — Other Ambulatory Visit: Payer: Self-pay | Admitting: Primary Care

## 2021-12-04 ENCOUNTER — Other Ambulatory Visit (HOSPITAL_COMMUNITY): Payer: Self-pay | Admitting: Cardiology

## 2021-12-04 MED ORDER — MONTELUKAST SODIUM 10 MG PO TABS
ORAL_TABLET | Freq: Every day | ORAL | 3 refills | Status: DC
  Filled 2021-12-04: qty 90, 90d supply, fill #0
  Filled 2022-03-01: qty 90, 90d supply, fill #1
  Filled 2022-05-30: qty 90, 90d supply, fill #2
  Filled 2022-09-13: qty 90, 90d supply, fill #3

## 2021-12-04 MED ORDER — GABAPENTIN 300 MG PO CAPS
300.0000 mg | ORAL_CAPSULE | Freq: Two times a day (BID) | ORAL | 3 refills | Status: DC
  Filled 2021-12-04: qty 180, 90d supply, fill #0
  Filled 2022-03-01: qty 180, 90d supply, fill #1
  Filled 2022-05-10: qty 180, 90d supply, fill #2
  Filled 2022-09-13: qty 180, 90d supply, fill #3

## 2021-12-04 MED FILL — Dapagliflozin Propanediol Tab 10 MG (Base Equivalent): ORAL | 90 days supply | Qty: 90 | Fill #0 | Status: AC

## 2021-12-04 MED FILL — Apixaban Tab 5 MG: ORAL | 90 days supply | Qty: 180 | Fill #1 | Status: AC

## 2021-12-05 ENCOUNTER — Other Ambulatory Visit: Payer: Self-pay

## 2021-12-05 NOTE — Progress Notes (Signed)
Advanced Heart Failure Clinic Note   Date:  12/07/2021   ID:  Carl Able., DOB January 28, 1961, MRN [id number]   Provider location: [address], [place] Type of Visit:  Established patient  PCP:  [name], MD  Cardiologist:  [name] Nephrology: Dr. Ronnald Ramp  HF Cardiologist: [name]  HPI: Carl Kandon Hosking. is a 61 y.o. male  with a history of CAD s/p CABG 2005, chronic atrial fibrillation on Eliquis, ESRD s/p renal transplant 2012, anc chronic diastolic CHF.  His renal function has been fairly stable, most recent creatinine 1.43.  He has been struggling with volume overload/CHF.  He has a history of ischemic cardiomyopathy with EF as low as 35-40% in the past but echo in 3/20 showed EF 60-65%.  There is evidence for RV failure with pulmonary hypertension by echo.    Admitted 11/05/18 low grade fever and shortness of breath. HS Trop negative. Covid 19 negative. Blood CX negative. Placed on antibiotics and discharged to home the next day.   Admitted 03/28/21 with A/C Diastolic HF. Diuresed with IV lasix and transitioned to torsemide 80/40 mg . Discharge weight 252 pounds.   Admitted 06/2019 with left arm pain. Left upper extremity venous Doppler revealed occluded cephalic vein at the AV fistula outflow tract in the antecubital fossa with no other thrombus identified. Vascular evaluated. He continued on Eliquis.  No plan for intervention. Also treated for acute gout flare. Discharged on prednisone taper.     Given IV lasix on 12/21 by HF Paramedicine.   Admitted to [facility] 2/22 with acute gout flare vs cellulitis. Nephrology was consulted to evaluate transplant regimen and follow along. He was treated with antibiotics and prednisone. Discharged on decreased torsemide dose of 40 mg daily. Weight 259 lbs.  Admitted 4/22 for left leg cellulitis after sustaining a dog scratch, received IV antibiotics. No changes to his diuretic therapy. Discharge weight 266  lbs.  Follow up with [name], 1/23, he had CP and underwent Lexiscan myoview, which overall was a low risk study with evidence of prior infarct in the infero-apical area with no significant change from before. Losartan added to GDMT regimen.  Follow up 5/23, volume overloaded, REDs 40%. Advised taking additional metolazone (3 doses that week), however labs showed worsening renal function and advised to keep at 2 doses.  Admitted in 5/23 with acute on chronic CHF and AKI.  [name] and losartan stopped. Diuresed with IV lasix. Echo showed EF 40-45%, RV mildly reduced, GIIIDD (restrictive), IVC dilated. Nephrology consulted for diuretic dosing. Discharged home, weight 266 lbs.  He had chest pain in 8/23 and was admitted at [facility], minimal HS-TnI elevation with no trend, thought to be demand ischemia from volume overload.    Echo 8/23 showing EF 40-45%, diffuse hypokinesis with dyssynchrony, mildly dilated RV with mildly decreased systolic function, IVC dilated, moderate AS with mean gradient 15, mild-moderate mitral stenosis with mean gradient 6.   Today he returns for HF follow up. Overall feeling fine. He cut back his metolazone to twice a week last week as he was dizzy (weight down 242 lbs) and felt he was dehydrated. He is not SOB with activity, he has mild dyspnea with inclines or stairs. Denies abnormal bleeding, CP, edema, abnormal bleeding, or PND/Orthopnea. Appetite ok. No fever or chills. Weight at home 246 pounds. Taking all medications. sBP at home 99-150s.  ECG (personally reviewed): AF, IVCD QRS 144 msec  REDs: 34%  Labs (7/19): LDL 49 Labs (2/20): K 3.7, creatinine 1.43 Labs (4/20): K 4.3, creatinine 1.24 Labs (11/06/18): K 3.7 creatinine 1.23  Labs (05/07/19): K 3.8 Creatinine 1.5  Labs (07/18/19): K 4.3 Creatinine 1.42  Labs (5/21): K 3.3, creatinine 1.21 Labs (11/21): K 3.6, creatinine 1.4 Labs (12/21): K 3.3, creatinine 2.4, hgb 12.8 Labs (04/01/19): K 3.3 Creatinine 2.2   Labs (8/22): K 4.0, creatinine 1.6, HDL 35, LDL 36, a1c 7.5 Labs (2/23): LDL 31, HDL 36 Labs (4/23): K 3.3, creatinine 1.68 Labs (5/23): K 4.1, creatinine 2.24 Labs (8/23): K 3.5, creatinine 2.36  PMH: 1. CAD: s/p CABG x 4 in [place] in 2005.  - Cardiolite (1/20): EF 47%, small fixed apical septal defect, no ischemia.  Low risk study.  - Cardiolite (12/22): small apical inferior defect, no changes from prior, no ischemia. Low risk study. 2. Asthma 3. Gout 4. Atrial fibrillation: Chronic.  5. ESRD s/p renal transplantation at [name] in 2012.  6. Anemia of chronic disease 7. Type II diabetes 8. HTN 9. Hyperlipidemia 10. OSA: On bipap.  11. Ischemic cardiomyopathy: EF 35-40% in past.  - Cardiolite (1/20) with EF 47%.  - Echo (1/20): EF 50-55%, mild MR, RV moderately dilated with moderately decreased systolic function, PASP 73 mmHg, moderate TR.   - Echo (3/20): EF 60-65%, PASP 75 mmHg, mildly dilated RV with mildly decreased systolic function.  - Echo (2/21): EF 45-50%, global HK, moderately dilated RV with moderately decreased systolic function, D-shaped interventricular septum, dilated IVC.  - RHC (2/21): mean RA 19, PA 78/31 mean 51, mean PCWP 26, CI 3.26, PVR 3.25 - Echo (5/23): EF 40-45%, RV mildly reduced, GIIIDD (restrictive) - Echo (8/23): EF 40-45%, diffuse hypokinesis with dyssynchrony, mildly dilated RV with mildly decreased systolic function, IVC dilated, moderate AS with mean gradient 15, mild-moderate mitral stenosis with mean gradient 6.  12. Obesity: s/p gastric bypass in 2015.  13. Bradycardia with beta blocker.  14. Aortic stenosis: Moderate on 8/23 echo.  15. Mitral stenosis: Mild to moderate on 8/23 echo.   Past Surgical History:  Procedure Laterality Date   BARIATRIC SURGERY     CARDIAC CATHETERIZATION     [facility]   CARDIAC CATHETERIZATION  05/03/2019   CATARACT EXTRACTION W/PHACO Right 10/29/2017   Procedure: CATARACT EXTRACTION PHACO AND INTRAOCULAR LENS  PLACEMENT ([place])  RIGHT DIABETIC;  Surgeon: [name], MD;  Location: [place];  Service: Ophthalmology;  Laterality: Right;  Diabetic - insulin   CATARACT EXTRACTION W/PHACO Left 11/18/2017   Procedure: CATARACT EXTRACTION PHACO AND INTRAOCULAR LENS PLACEMENT ([place]) LEFT IVA/TOPICAL;  Surgeon: [name], MD;  Location: [place];  Service: Ophthalmology;  Laterality: Left;  DIABETES - insulin sleep apnea   COLONOSCOPY WITH PROPOFOL N/A 04/22/2013   Procedure: COLONOSCOPY WITH PROPOFOL;  Surgeon: [name], MD;  Location: [facility];  Service: Endoscopy;  Laterality: N/A;   CORONARY ARTERY BYPASS GRAFT  2005   X 4   DG ANGIO AV SHUNT*L*     right and left upper arms   FASCIOTOMY  03/03/2012   Procedure: FASCIOTOMY;  Surgeon: [name], MD;  Location: [place];  Service: Orthopedics;  Laterality: Right;  FASCIOTOMY RIGHT SMALL FINGER   FASCIOTOMY Left 08/17/2013   Procedure: FASCIOTOMY LEFT RING;  Surgeon: [name], MD;  Location: [place];  Service: Orthopedics;  Laterality: Left;   INCISION AND DRAINAGE ABSCESS Left 10/15/2015   Procedure: INCISION AND DRAINAGE ABSCESS;  Surgeon: Bea Graff  Sarita Haver, MD;  Location: [facility];  Service: General;  Laterality: Left;   KIDNEY TRANSPLANT  09/13/2010   cadaver--at [facility] HEART CATH N/A 11/15/2016   Procedure: RIGHT HEART CATH;  Surgeon: [name], MD;  Location: [place] [facility];  Service: Cardiovascular;  Laterality: N/A;   RIGHT HEART CATH N/A 05/03/2019   Procedure: RIGHT HEART CATH;  Surgeon: [name], MD;  Location: [place] [facility];  Service: Cardiovascular;  Laterality: N/A;   TYMPANIC MEMBRANE REPAIR  1/12   left   VASECTOMY     Current Outpatient Medications  Medication Sig Dispense Refill   albuterol (PROVENTIL) (2.5 MG/3ML) 0.083% nebulizer solution Take 3 mLs (2.5 mg total) by nebulization every 6 (six) hours as needed for  wheezing or shortness of breath. 150 mL 0   albuterol (VENTOLIN HFA) 108 (90 Base) MCG/ACT inhaler Inhale 2 puffs into the lungs every 6 (six) hours as needed for wheezing or shortness of breath. 18 g 5   allopurinol (ZYLOPRIM) 100 MG tablet Take 100 mg by mouth daily.     apixaban (ELIQUIS) 5 MG TABS tablet TAKE 1 TABLET BY MOUTH TWICE DAILY 180 tablet 1   beclomethasone (QVAR) 80 MCG/ACT inhaler Inhale 2 puffs into the lungs 2 (two) times daily. 10.6 g 5   buPROPion (WELLBUTRIN SR) 150 MG 12 hr tablet TAKE 1 TABLET BY MOUTH TWICE DAILY 180 tablet 3   cholecalciferol (VITAMIN D3) 25 MCG (1000 UNIT) tablet Take 1,000 Units by mouth at bedtime.     colchicine 0.6 MG tablet Take 1 tablet (0.6 mg total) by mouth every other day 30 tablet 1   Continuous Blood Gluc Sensor (FREESTYLE LIBRE 3 SENSOR) MISC Use one every 2 weeks. 6 each 3   dapagliflozin propanediol (FARXIGA) 10 MG TABS tablet Take 1 tablet (10 mg total) by mouth daily before breakfast. 90 tablet 3   fluticasone (FLONASE) 50 MCG/ACT nasal spray PLACE 2 SPRAYS INTO BOTH NOSTRILS DAILY. 16 g 11   gabapentin (NEURONTIN) 300 MG capsule TAKE 1 CAPSULE BY MOUTH TWICE DAILY 180 capsule 3   hydrALAZINE (APRESOLINE) 25 MG tablet Take 1/2 tablet (12.5 mg total) by mouth every 8 (eight) hours. 45 tablet 0   hydrocortisone 2.5 % cream Apply topically 3 (three) times daily as needed. 30 g 3   insulin glargine-yfgn (SEMGLEE) 100 UNIT/ML Pen Inject 40 Units subcutaneously once daily 45 mL 3   insulin lispro (HUMALOG KWIKPEN) 100 UNIT/ML KwikPen INJECT UP TO 50 UNITS UNDER THE SKIN DAILY AS DIRECTED 45 mL 3   Insulin Pen Needle (UNIFINE PENTIPS) 31G X 5 MM MISC Use 4 times daily as directed 400 each 3   iron polysaccharides (FERREX 150) 150 MG capsule TAKE 1 CAPSULE BY MOUTH 2 TIMES DAILY. 180 capsule 1   lidocaine (LIDODERM) 5 % Place 1 patch onto the skin daily. Remove & Discard patch within 12 hours or as directed by MD (Patient taking differently:  Place 1 patch onto the skin daily. Remove & Discard patch within 12 hours or as directed by MD as needed) 30 patch 0   metolazone (ZAROXOLYN) 2.5 MG tablet Take 1 tablet (2.5 mg total) by mouth 3 (three) times a week. Monday, Wednesday Friday 20 tablet 6   montelukast (SINGULAIR) 10 MG tablet TAKE 1 TABLET BY MOUTH AT BEDTIME. 90 tablet 3   Multiple Vitamin (MULTIVITAMIN WITH MINERALS) TABS tablet Take 1 tablet by mouth daily.     mycophenolate (MYFORTIC)  180 MG EC tablet Take 360 mg by mouth 2 (two) times daily.     omeprazole (PRILOSEC) 20 MG capsule TAKE 1 CAPSULE BY MOUTH DAILY. 90 capsule 3   Potassium Chloride CR (MICRO-K) 8 MEQ CPCR capsule CR Take 13 capsules (104 mEq total) by mouth 2 (two) times daily. 780 capsule 6   predniSONE (DELTASONE) 5 MG tablet Take 1 tablet (5 mg total) by mouth daily. 90 tablet 3   rosuvastatin (CRESTOR) 10 MG tablet Take 1 tablet (10 mg total) by mouth daily. 90 tablet 3   sulfamethoxazole-trimethoprim (BACTRIM) 400-80 MG tablet TAKE 1 TABLET BY MOUTH EVERY MONDAY, WEDNESDAY, FRIDAY. 36 tablet 3   tacrolimus (PROGRAF) 1 MG capsule Take 2 capsules (2 mg total) by mouth 2 times daily. 120 capsule 5   tadalafil, PAH, (ADCIRCA) 20 MG tablet Take 1 tablet (20 mg total) by mouth daily. 90 tablet 3   tamsulosin (FLOMAX) 0.4 MG CAPS capsule TAKE 1 CAPSULE BY MOUTH DAILY. 90 capsule 3   torsemide (DEMADEX) 100 MG tablet Take 1 tablet (100 mg total) by mouth 2 (two) times daily. 180 tablet 3   traMADol (ULTRAM) 50 MG tablet Take 1 tablet (50 mg total) by mouth every 6 (six) hours as needed for moderate pain or severe pain. 30 tablet 0   zinc sulfate 220 (50 Zn) MG capsule Take 220 mg by mouth daily.     No current facility-administered medications for this encounter.    Allergies:   Contrast media [iodinated contrast media], Iodine, and Ibuprofen   Social History:  The patient  reports that he quit smoking about 27 years ago. His smoking use included cigars. He has  never been exposed to tobacco smoke. He has never used smokeless tobacco. He reports current alcohol use. He reports that he does not use drugs.   Family History:  The patient's family history includes Breast cancer in his maternal grandmother; Diabetes in his maternal grandmother; Heart disease in his father; Kidney disease in his father; Kidney failure in his father; Liver cancer in his paternal grandmother and paternal uncle; Valvular heart disease in his mother.   ROS:  Please see the history of present illness.   All other systems are personally reviewed and negative.   Recent Labs: 11/02/2021: Magnesium 2.1 11/03/2021: Hemoglobin 9.6; Platelets 120 11/04/2021: ALT 10 11/07/2021: B Natriuretic Peptide 332.3 11/12/2021: BUN 63; Creatinine, Ser 2.36; Potassium 3.5; Sodium 140  Personally reviewed   Wt Readings from Last 3 Encounters:  12/07/21 114.6 kg (252 lb 9.6 oz)  12/06/21 114.9 kg (253 lb 3.2 oz)  11/21/21 113.9 kg (251 lb)    BP (!) 148/78   Pulse 72   Wt 114.6 kg (252 lb 9.6 oz)   SpO2 95%   BMI 36.24 kg/m  Physical Exam: General:  NAD. No resp difficulty HEENT: Normal Neck: Supple. No JVD. Carotids 2+ bilat; no bruits. No lymphadenopathy or thryomegaly appreciated. Cor: PMI nondisplaced. Regular rate & rhythm. No rubs, gallops, 2/6 SEM, clear S2 Lungs: Clear Abdomen: Soft, nontender, nondistended. No hepatosplenomegaly. No bruits or masses. Good bowel sounds. Extremities: No cyanosis, clubbing, rash, edema; chronic venous stasis changes to BLE Neuro: Alert & oriented x 3, cranial nerves grossly intact. Moves all 4 extremities w/o difficulty. Affect pleasant.  Assessment & Plan: 1. Chronic Heart failure with Mid Range EF/ With prominent RV failure/pulmonary hypertension:  Ischemic cardiomyopathy.  ?If RV failure is related to severe OSA, he was a remote smoker. Echo in 2/21 with EF  45-50%, moderately dilated RV with moderately decreased systolic function, moderate pulmonary  hypertension. Echo today showed EF 40-45%, diffuse hypokinesis with dyssynchrony, mildly dilated RV with mildly decreased systolic function, IVC dilated, moderate AS with mean gradient 15, mild-moderate mitral stenosis with mean gradient 6.  On exam, he is not volume overloaded, weight down 12 lbs. Improved NYHA class II symptoms. CKD stage 3 makes management more difficult.  - Continue torsemide 100 mg bid/KCL 100 bid + metolazone 2.5 mg two times/week. Aim for home weight 246 lbs, needs to add an extra metolazone dose if home weight > 249 lbs (suspect he will need to go back to 3x/week metolazone dosing) BMET/BNP today. - Continue Farxiga 10 mg daily. - Continue hydralazine 12.5 mg tid for afterload reduction.  - He is now off ARB and spironolactone with elevated creatinine.   - No Imdur given tadalafil use for PH.  2. Atrial fibrillation: Chronic. Rate controlled.  - Continue Eliquis. No bleeding issues.  - Not on beta blocker with history of bradycardia.   3. CAD: S/p CABG 2005.  Cardiolite 12/22 with prior MI, no ischemia.  No further chest pain. High threshold for cath given CKD.  - No ASA given Eliquis use.  - Continue Crestor 10 mg daily, good lipids 2/23. 4. CKD Stage 3: S/p renal transplantation in 2012. Followed by transplant center at [facility]. He remains on mycophenolate and tacrolimus. Last creatinine 2.36  - BMET today. 5. OSA: Continue Bipap every night.  6. HTN: BP mildly elevated today, but consistently controlled at home. I asked him to check BP at home and log. Need to avoid hypotension with CKD, but would increase hydralazine further if BP readings consistently elevated. - Meds as above. No change today. 7. DM 2: On insulin. He follows with Endocrinology. - Continue dapagliflozin 8. Pulmonary hypertension: Severe pulmonary hypertension by 3/20 echo.  RHC in 2/21 with moderate, mixed pulmonary venous/pulmonary arterial hypertension (PVR 3.25 WU). Suspect Suspect group 2 and  group 3 PH (OSA, ? unrecognized OHS).  - Using BiPAP nightly. - Continue tadalafil 20 mg daily.  9. Aortic stenosis: Moderate on echo 8/23.  10. Mitral stenosis: Mild to moderate on recent echo. Suspect due to calcific degeneration, not rheumatic heart disease.  11. Pre-OP clearance: OK from cardiac standpoint to hold Eliquis 2 days pre-cystoscopy. Would resume 1 day post procedure. Discussed with [name] & PharmD.  Follow up in 2-3 months with [name].  Maricela Bo [facility] FNP-BC 12/07/2021

## 2021-12-05 NOTE — Progress Notes (Signed)
HPI M followed for OSA, complicated by CAD/ CABG4, CHF, PAH, AFib/ Eliquis, Chronic Venous Insufficiency, Superficial Thrombophlebitis, OSA, OHS, Asthma moderate, Covid infection, DM2, Gout,  ESRD/ transplant, Depression, Obesity/ Bariatric Surgery, Rheumatoid Arthritis, NPSG [place] 03/15/12- AHI 57.9/ hr, desaturation to 78.8%, body weight 310 lbs Overnight oximetry on BIPAP / Room Air 11/29/20- </= 88% for 2 hrs 58 minutes =============================================================  06/05/21- 62 yoM followed for OSA, complicated by CAD/ CABG4, CHF, PAH, AFib/ Eliquis, Chronic Venous Insufficiency, Superficial Thrombophlebitis, OSA, OHS, Asthma moderate, Covid infection, DM2, Gout,  ESRD/ transplant, Depression, Obesity/ Bariatric Surgery, Rheumatoid Arthritis, -Neb albuterol, singulair, Ventolin hfa, Flonase,  BIPAP 14/9, PS 4/ [employer]                                                 Download-compliance 93%, AHI 4.3/ hr Body weight today- Covid vax-3 Moderna, 2 Phizer Flu vax-had -----Patient is doing good, states that sometimes he wakes up early. Goes to sleep about 10 pm and is awake between 3 am and 4:30 am everyday.  We again reviewed his overnight oximetry as we have done at last visit and are reordering to double check.  We will have to see if we can add oxygen to his BiPAP or if he will need an in center study for the documentation to get home O2 for sleep. He wakes pretty routinely between 3 and 4 AM.  Ambien had caused sleepwalking and sleep eating.  I considered trial of trazodone but we will wait on that until after we check his oxygen status.  12/06/21-  17 yoM followed for OSA, complicated by CAD/ CABG4, CHF, PAH, AFib/ Eliquis, Chronic Venous Insufficiency, Superficial Thrombophlebitis, OSA, OHS, Asthma moderate, Covid infection, DM2, Gout,  ESRD/ Transplant, Depression, Obesity/ Bariatric Surgery, Rheumatoid Arthritis, -Neb albuterol, singulair, Ventolin hfa, Flonase, Qvar 80, BIPAP  14/9, PS 4/ [employer]                                                 Download-compliance 93%, AHI 5.4/ hr Body weight today-253 lbs Covid vax-3 Moderna, 2 Phizer Flu vax- today standard Comfortable with BIPAP but asked about alternatives. We discussed [facility] and he would like referral to look into it. We are calling now- Overnight oximetry on BIPAP was done as ordered in March but never sent to [name].  ROS-see HPI   + = positive Constitutional:    weight loss, night sweats, fevers, chills, fatigue, lassitude. HEENT:    headaches, difficulty swallowing, tooth/dental problems, sore throat,       sneezing, itching, ear ache, nasal congestion, post nasal drip, snoring CV:    chest pain, orthopnea, PND, swelling in lower extremities, anasarca,                                   dizziness, palpitations Resp:   shortness of breath with exertion or at rest.                productive cough,   non-productive cough, coughing up of blood.              change in color of mucus.  wheezing.  Skin:    rash or lesions. GI:  No-   heartburn, indigestion, abdominal pain, nausea, vomiting, diarrhea,                 change in bowel habits, loss of appetite GU: dysuria, change in color of urine, no urgency or frequency.   flank pain. MS:   joint pain, stiffness, decreased range of motion, back pain. Neuro-     nothing unusual Psych:  change in mood or affect.  depression or anxiety.   memory loss.  OBJ- Physical Exam General- Alert, Oriented, Affect-appropriate, Distress- none acute,  Skin- rash-none, lesions- none, excoriation- none Lymphadenopathy- none Head- atraumatic            Eyes- Gross vision intact, PERRLA, conjunctivae and secretions clear            Ears- +Hearing aids            Nose- Clear, no-Septal dev, mucus, polyps, erosion, perforation             Throat- Mallampati III-IV , mucosa clear , drainage- none, tonsils- atrophic Neck- flexible , trachea midline, no stridor , thyroid nl, carotid  no bruit Chest - symmetrical excursion , unlabored           Heart/CV- RRR , no murmur , no gallop  , no rub, nl s1 s2                           - JVD- none , edema- none, stasis changes- none, varices- none           Lung- clear to P&A, wheeze- none, cough- none , dullness-none, rub- none           Chest wall-  Abd- + kidney R abd Br/ Gen/ Rectal- Not done, not indicated Extrem- +stasis changes calves Neuro- grossly intact to observation

## 2021-12-06 ENCOUNTER — Telehealth: Payer: Self-pay

## 2021-12-06 ENCOUNTER — Telehealth: Payer: Self-pay | Admitting: Internal Medicine

## 2021-12-06 ENCOUNTER — Telehealth: Payer: Self-pay | Admitting: Urgent Care

## 2021-12-06 ENCOUNTER — Ambulatory Visit (INDEPENDENT_AMBULATORY_CARE_PROVIDER_SITE_OTHER): Payer: 59 | Admitting: Internal Medicine

## 2021-12-06 ENCOUNTER — Encounter: Payer: Self-pay | Admitting: Internal Medicine

## 2021-12-06 VITALS — BP 140/82 | HR 59 | Ht 70.0 in | Wt 253.2 lb

## 2021-12-06 DIAGNOSIS — G4733 Obstructive sleep apnea (adult) (pediatric): Secondary | ICD-10-CM | POA: Diagnosis not present

## 2021-12-06 DIAGNOSIS — Z23 Encounter for immunization: Secondary | ICD-10-CM | POA: Diagnosis not present

## 2021-12-06 DIAGNOSIS — I200 Unstable angina: Secondary | ICD-10-CM | POA: Diagnosis not present

## 2021-12-06 DIAGNOSIS — G4734 Idiopathic sleep related nonobstructive alveolar hypoventilation: Secondary | ICD-10-CM

## 2021-12-06 NOTE — Telephone Encounter (Signed)
[employer] is returning a call regarding patient's Oxemetry test.  Please call back at [phone]

## 2021-12-06 NOTE — Assessment & Plan Note (Addendum)
Benefits from BIPAP Plan- continue BIPAP 14/9. Referring to ENT to consider [facility]

## 2021-12-06 NOTE — Progress Notes (Signed)
  Perioperative Services Pre-Admission/Anesthesia Testing     Date: 12/06/21  Name: [name]. MRN:   [id number]  Re: Request from surgery for clearance prior to scheduled procedure  Patient is scheduled to undergo a CYSTOSCOPY WITH INSERTION OF UROLIFT on 12/25/2021 with Dr. John Giovanni, MD. Patient has not been scheduled for his PAT appointment at this point, thus has not undergone review by PAT RN and/or APP. Received communication from primary attending surgeon's office requesting that patient be submitted for clearance from cardiology.  In brief review of his medical record, patient also has cardiopulmonary diagnoses and is followed by pulmonary medicine. I will attempt to obtain perioperative recommendations from primary pulmonary team as well.   PROVIDER SPECIALTY FAXED TO   [name], MD  Cardiology Routed to APP pool for review and clearance  Baird Lyons, MD Pulmonary Medicine [phone]   Plan:  Clearance documents generated and faxed to appropriate provider(s) as noted above. Note will be updated to reflect communication with provider's office as it relates to clearance being provided and/or the need for office visit prior to clearance for surgery being issued.   Honor Loh, MSN, APRN, FNP-C, CEN [facility]  Peri-operative Services Nurse Practitioner Phone: [phone] 12/06/21 2:53 PM  NOTE: This note has been prepared using Dragon dictation software. Despite my best ability to proofread, there is always the potential that unintentional transcriptional errors may still occur from this process.

## 2021-12-06 NOTE — Progress Notes (Signed)
[facility] Posting Form   Surgery Date/Time: Date: 12/25/2021  Surgeon: Dr. John Giovanni, MD  Surgery Location: Day Surgery  Inpt ( No  )   Outpt (Yes)   Obs ( No  )   Diagnosis: N40.1, N13.8 Benign Prostatic Hyperplasia with Urinary Obstruction  -CPT: 52441, [phone]  Surgery: Cystoscopy with insertion of Urolift  Stop Anticoagulations: Yes will need to hold Eliquis and also ASA  Cardiac/Medical/Pulmonary Clearance needed: yes  Clearance needed from Dr: Fletcher Anon  Clearance request sent on: Date: 12/06/21 by Honor Loh, NP with Peri-Operative Services  *Orders entered into EPIC  Date: 12/06/21   *Case booked in EPIC  Date: 11/30/2021  *Notified pt of Surgery: Date: 11/30/2021  PRE-OP UA & CX: Yes, will obtain in clinic on 12/14/2021  *Placed into Prior Authorization Work Fabio Bering Date: 12/06/21   Assistant/laser/rep:No

## 2021-12-06 NOTE — Patient Instructions (Addendum)
Order- we need results of overnight oximetry on BIPAP done this Spring.  Order- refer to Dr Redmond Baseman ENT consider Dawna Part for OSA  Order- Flu vax standard  We can continue BIPAP 14/9, PS 4

## 2021-12-06 NOTE — Assessment & Plan Note (Signed)
We have called for result of overnight oximetry on BIPA/ room air. He says DME is still billing him for O2 concentrator he had turned back in some time ago.

## 2021-12-06 NOTE — Telephone Encounter (Signed)
   Pre-operative Risk Assessment    Patient Name: [name].  DOB: Sep 04, 1960 MRN: [id number]      Request for Surgical Clearance    Procedure:   Cystoscopy with Insertion of Urolift  Date of Surgery:  Clearance 12/25/21                                 Surgeon:  Dr. John Giovanni Surgeon's Group or Practice Name:  [facility] Phone number:  [phone] Fax number:  [phone]   Type of Clearance Requested:   - Medical  - Pharmacy:  Hold Apixaban (Eliquis)     Type of Anesthesia:  MAC   Additional requests/questions:   None  Signed, Nicloe Frontera   12/06/2021, 3:11 PM

## 2021-12-06 NOTE — Telephone Encounter (Signed)
I was looking for a clean copy of them in the chart but could not find them. I thought we had just recently done this.

## 2021-12-06 NOTE — Telephone Encounter (Signed)
I spoke with [name]. We have discussed possible surgery dates and Tuesday October 3rd, 2023 was agreed upon by all parties. Patient given information about surgery date, what to expect pre-operatively and post operatively.  We discussed that a Pre-Admission Testing office will be calling to set up the pre-op visit that will take place prior to surgery, and that these appointments are typically done over the phone with a Pre-Admissions RN.  Informed patient that our office will communicate any additional care to be provided after surgery. Patients questions or concerns were discussed during our call. Advised to call our office should there be any additional information, questions or concerns that arise. Patient verbalized understanding.

## 2021-12-07 ENCOUNTER — Encounter (HOSPITAL_COMMUNITY): Payer: Self-pay

## 2021-12-07 ENCOUNTER — Telehealth (HOSPITAL_COMMUNITY): Payer: Self-pay

## 2021-12-07 ENCOUNTER — Other Ambulatory Visit: Payer: Self-pay

## 2021-12-07 ENCOUNTER — Telehealth: Payer: Self-pay | Admitting: Internal Medicine

## 2021-12-07 ENCOUNTER — Ambulatory Visit (HOSPITAL_COMMUNITY)
Admission: RE | Admit: 2021-12-07 | Discharge: 2021-12-07 | Disposition: A | Discharge status: Home / Self Care | Payer: 59 | Source: Ambulatory Visit | Attending: Family Medicine | Admitting: Family Medicine

## 2021-12-07 VITALS — BP 148/78 | HR 72 | Wt 252.6 lb

## 2021-12-07 DIAGNOSIS — I272 Pulmonary hypertension, unspecified: Secondary | ICD-10-CM

## 2021-12-07 DIAGNOSIS — I2721 Secondary pulmonary arterial hypertension: Secondary | ICD-10-CM | POA: Diagnosis not present

## 2021-12-07 DIAGNOSIS — Z01818 Encounter for other preprocedural examination: Secondary | ICD-10-CM

## 2021-12-07 DIAGNOSIS — Z794 Long term (current) use of insulin: Secondary | ICD-10-CM | POA: Diagnosis not present

## 2021-12-07 DIAGNOSIS — Z79621 Long term (current) use of calcineurin inhibitor: Secondary | ICD-10-CM | POA: Diagnosis not present

## 2021-12-07 DIAGNOSIS — Z7901 Long term (current) use of anticoagulants: Secondary | ICD-10-CM | POA: Insufficient documentation

## 2021-12-07 DIAGNOSIS — Z01810 Encounter for preprocedural cardiovascular examination: Secondary | ICD-10-CM | POA: Insufficient documentation

## 2021-12-07 DIAGNOSIS — I482 Chronic atrial fibrillation, unspecified: Secondary | ICD-10-CM | POA: Insufficient documentation

## 2021-12-07 DIAGNOSIS — I2510 Atherosclerotic heart disease of native coronary artery without angina pectoris: Secondary | ICD-10-CM | POA: Insufficient documentation

## 2021-12-07 DIAGNOSIS — Z79624 Long term (current) use of inhibitors of nucleotide synthesis: Secondary | ICD-10-CM | POA: Diagnosis not present

## 2021-12-07 DIAGNOSIS — N183 Chronic kidney disease, stage 3 unspecified: Secondary | ICD-10-CM | POA: Diagnosis not present

## 2021-12-07 DIAGNOSIS — E1122 Type 2 diabetes mellitus with diabetic chronic kidney disease: Secondary | ICD-10-CM | POA: Insufficient documentation

## 2021-12-07 DIAGNOSIS — E1121 Type 2 diabetes mellitus with diabetic nephropathy: Secondary | ICD-10-CM

## 2021-12-07 DIAGNOSIS — I350 Nonrheumatic aortic (valve) stenosis: Secondary | ICD-10-CM | POA: Diagnosis not present

## 2021-12-07 DIAGNOSIS — I252 Old myocardial infarction: Secondary | ICD-10-CM | POA: Insufficient documentation

## 2021-12-07 DIAGNOSIS — Z951 Presence of aortocoronary bypass graft: Secondary | ICD-10-CM | POA: Insufficient documentation

## 2021-12-07 DIAGNOSIS — N1832 Chronic kidney disease, stage 3b: Secondary | ICD-10-CM

## 2021-12-07 DIAGNOSIS — I5042 Chronic combined systolic (congestive) and diastolic (congestive) heart failure: Secondary | ICD-10-CM | POA: Insufficient documentation

## 2021-12-07 DIAGNOSIS — Z79899 Other long term (current) drug therapy: Secondary | ICD-10-CM | POA: Insufficient documentation

## 2021-12-07 DIAGNOSIS — I255 Ischemic cardiomyopathy: Secondary | ICD-10-CM | POA: Diagnosis not present

## 2021-12-07 DIAGNOSIS — Z7984 Long term (current) use of oral hypoglycemic drugs: Secondary | ICD-10-CM | POA: Insufficient documentation

## 2021-12-07 DIAGNOSIS — Z87891 Personal history of nicotine dependence: Secondary | ICD-10-CM | POA: Insufficient documentation

## 2021-12-07 DIAGNOSIS — Z940 Kidney transplant status: Secondary | ICD-10-CM | POA: Insufficient documentation

## 2021-12-07 DIAGNOSIS — G4733 Obstructive sleep apnea (adult) (pediatric): Secondary | ICD-10-CM | POA: Insufficient documentation

## 2021-12-07 DIAGNOSIS — I050 Rheumatic mitral stenosis: Secondary | ICD-10-CM | POA: Diagnosis not present

## 2021-12-07 DIAGNOSIS — I130 Hypertensive heart and chronic kidney disease with heart failure and stage 1 through stage 4 chronic kidney disease, or unspecified chronic kidney disease: Secondary | ICD-10-CM | POA: Insufficient documentation

## 2021-12-07 DIAGNOSIS — I10 Essential (primary) hypertension: Secondary | ICD-10-CM

## 2021-12-07 LAB — BASIC METABOLIC PANEL
Anion gap: 9 (ref 5–15)
BUN: 42 mg/dL — ABNORMAL HIGH (ref 8–23)
CO2: 32 mmol/L (ref 22–32)
Calcium: 9.3 mg/dL (ref 8.9–10.3)
Chloride: 100 mmol/L (ref 98–111)
Creatinine, Ser: 1.78 mg/dL — ABNORMAL HIGH (ref 0.61–1.24)
GFR, Estimated: 43 mL/min — ABNORMAL LOW (ref >60)
Glucose, Bld: 120 mg/dL — ABNORMAL HIGH (ref 70–99)
Potassium: 4.2 mmol/L (ref 3.5–5.1)
Sodium: 141 mmol/L (ref 135–145)

## 2021-12-07 LAB — BRAIN NATRIURETIC PEPTIDE: B Natriuretic Peptide: 508.4 pg/mL — ABNORMAL HIGH (ref 0.0–100.0)

## 2021-12-07 MED ORDER — METOLAZONE 2.5 MG PO TABS
ORAL_TABLET | ORAL | 3 refills | Status: DC
  Filled 2021-12-07: qty 14, 35d supply, fill #0
  Filled 2022-01-17: qty 14, 35d supply, fill #1
  Filled 2022-02-20: qty 14, 35d supply, fill #2

## 2021-12-07 NOTE — Telephone Encounter (Signed)
Patient with diagnosis of a fib on Eliquis for anticoagulation.    Procedure: Cystoscopy with Insertion of Urolift   Date of procedure: 12/25/21   CHA2DS2-VASc Score = 4  This indicates a 4.8% annual risk of stroke. The patient's score is based upon: CHF History: 1 HTN History: 1 Diabetes History: 1 Stroke History: 0 Vascular Disease History: 1 Age Score: 0 Gender Score: 0     CrCl 44 mL/min Platelet count 120K    Per office protocol, patient can hold Eliqius for 2 days prior to procedure.    **This guidance is not considered finalized until pre-operative APP has relayed final recommendations.**

## 2021-12-07 NOTE — Telephone Encounter (Signed)
Patient advised and verbalized understanding. Med list updated to reflect changes.   Meds ordered this encounter  Medications   metolazone (ZAROXOLYN) 2.5 MG tablet    Sig: Take 1 tablet by mouth 3 times weekly alternating with 2 times weekly.    Dispense:  14 tablet    Refill:  3    Please cancel all previous orders for current medication. Change in dosage or pill size.

## 2021-12-07 NOTE — Telephone Encounter (Signed)
   Primary Cardiologist: [name], MD  Chart reviewed as part of pre-operative protocol coverage. Given past medical history and time since last visit, based on ACC/AHA guidelines, [name]. would be at acceptable risk for the planned procedure without further cardiovascular testing.   Per office protocol, patient can hold Eliqius for 2 days prior to procedure.    I will route this recommendation to the requesting party via Epic fax function and remove from pre-op pool.  Please call with questions.  Emmaline Life, NP-C  12/07/2021, 3:08 PM [address], [address] Office [phone] Fax [phone]

## 2021-12-07 NOTE — Patient Instructions (Addendum)
Thank you for coming in today  Labs were done today, if any labs are abnormal the clinic will call you No news is good news  Ok to keep Metolazone at twice a week if weight is up to 249 lbs Metolazone can be a 3 times week  HOLD Eliquis for 2 days before Cystoscopy  Your physician recommends that you schedule a follow-up appointment in:  2-3 months with [name]    Do the following things EVERYDAY: Weigh yourself in the morning before breakfast. Write it down and keep it in a log. Take your medicines as prescribed Eat low salt foods--Limit salt (sodium) to 2000 mg per day.  Stay as active as you can everyday Limit all fluids for the day to less than 2 liters  At the [facility], you and your health needs are our priority. As part of our continuing mission to provide you with exceptional heart care, we have created designated Provider Care Teams. These Care Teams include your primary Cardiologist (physician) and Advanced Practice Providers (APPs- Physician Assistants and Nurse Practitioners) who all work together to provide you with the care you need, when you need it.   You may see any of the following providers on your designated Care Team at your next follow up: Dr Glori Bickers Dr Loralie Champagne Dr. Roxana Hires, NP Lyda Jester, [place] [facility] [place], [place] Forestine Na, NP Audry Riles, PharmD   Please be sure to bring in all your medications bottles to every appointment.   If you have any questions or concerns before your next appointment please send [name] a message through [place] or call our office at [phone].    TO LEAVE A MESSAGE FOR THE NURSE SELECT OPTION 2, PLEASE LEAVE A MESSAGE INCLUDING: YOUR NAME DATE OF BIRTH CALL BACK NUMBER REASON FOR CALL**this is important as we prioritize the call backs  YOU WILL RECEIVE A CALL BACK THE SAME DAY AS LONG AS YOU CALL BEFORE 4:00 PM

## 2021-12-07 NOTE — Progress Notes (Signed)
ReDS Vest / Clip - 12/07/21 1000       ReDS Vest / Clip   Station Marker D    Ruler Value 34    ReDS Value Range Low volume    ReDS Actual Value 34

## 2021-12-07 NOTE — Telephone Encounter (Signed)
Fax received from Dr. John Giovanni with [facility] to perform a Cystoscopy with insertion of urolift  on patient.  Patient needs surgery clearance. Surgery is 12/25/2021. Patient was seen on 12/06/2021. Office protocol is a risk assessment can be sent to surgeon if patient has been seen in 60 days or less.   Sending to Dr Annamaria Boots for risk assessment or recommendations if patient needs to be seen in office prior to surgical procedure.

## 2021-12-07 NOTE — Telephone Encounter (Signed)
Left message for pt has been cleared and he will need to hold Eliquis x 2 days prior. Will resume once surgeon feels it is safe ti resume. If any questions to call the office.   Notes have been faxed to surgeon office.

## 2021-12-07 NOTE — Telephone Encounter (Signed)
Clearance request received for upcoming cystoscopy with insertion of UroLift.  Patient has an appointment today, 12/07/21, with advanced heart failure clinic.  Request has been sent to address clearance during appointment and request routed to Pharm.D. for advisement on holding Eliquis.  Emmaline Life, NP-C     12/07/2021, 9:30 AM [address] Office [phone] Fax [phone]

## 2021-12-07 NOTE — Telephone Encounter (Signed)
Addressed in [place] Per Allena Katz, NP/ Dr Maceo Pro to proceed from a cardiac standpoint

## 2021-12-07 NOTE — Telephone Encounter (Signed)
-----  Message from Rafael Bihari, [place] sent at 12/07/2021 12:42 PM EDT ----- Kidney function stable. BNP elevated.  Please change metolazone dosing to 3x/week alternating with 2x every other week. I think this will keep the fluid best controlled.

## 2021-12-10 NOTE — Telephone Encounter (Signed)
Tried to call [employer] back regarding ONO but was on hold for too long. Will try again

## 2021-12-10 NOTE — Telephone Encounter (Signed)
Spoke to pt. Advised FMLA forms are signed and up front for pick-up. No Charge as they just needed a signature. I am the one who filled out forms so they would be clearer than the last several copies.

## 2021-12-11 NOTE — Telephone Encounter (Signed)
OV notes and clearance form have been faxed back to [facility]. Nothing further needed at this time.

## 2021-12-11 NOTE — Telephone Encounter (Signed)
Addendum added to my 9/14 office note. Clear for surgery.

## 2021-12-12 NOTE — Telephone Encounter (Signed)
Attempted to call Suanne Marker again at number provided but was on hold for 10 minutes. Closing encounter since this is the second sttempt for me to call adPT WITH NO ANSWER. Nothing further needed

## 2021-12-13 ENCOUNTER — Encounter
Admission: RE | Admit: 2021-12-13 | Discharge: 2021-12-13 | Disposition: A | Discharge status: Home / Self Care | Payer: 59 | Source: Ambulatory Visit | Attending: Urology | Admitting: Urology

## 2021-12-13 VITALS — Ht 70.0 in | Wt 253.0 lb

## 2021-12-13 DIAGNOSIS — Z794 Long term (current) use of insulin: Secondary | ICD-10-CM

## 2021-12-13 DIAGNOSIS — E876 Hypokalemia: Secondary | ICD-10-CM

## 2021-12-13 HISTORY — DX: Anemia, unspecified: D64.9

## 2021-12-13 NOTE — Patient Instructions (Addendum)
Your procedure is scheduled on: 12/25/21 Report to [place]. To find out your arrival time please call [phone] between 1PM - 3PM on 12/24/21 .  Remember: Instructions that are not followed completely may result in serious medical risk, up to and including death, or upon the discretion of your surgeon and anesthesiologist your surgery may need to be rescheduled.     _X__ 1. Do not eat food or drink any liquids after midnight the night before your procedure.                 No gum chewing or hard candies.   __X__2.  On the morning of surgery brush your teeth with toothpaste and water, you                 may rinse your mouth with mouthwash if you wish.  Do not swallow any              toothpaste of mouthwash.     _X__ 3.  No Alcohol for 24 hours before or after surgery.   _X__ 4.  Do Not Smoke or use e-cigarettes For 24 Hours Prior to Your Surgery.                 Do not use any chewable tobacco products for at least 6 hours prior to                 surgery.  ____  5.  Bring all medications with you on the day of surgery if instructed.   __X__  6.  Notify your doctor if there is any change in your medical condition      (cold, fever, infections).     Do not wear jewelry, make-up, hairpins, clips or nail polish. Do not wear lotions, powders, or perfumes.  Do not shave body hair 48 hours prior to surgery. Men may shave face and neck. Do not bring valuables to the hospital.    [facility] is not responsible for any belongings or valuables.  Contacts, dentures/partials or body piercings may not be worn into surgery. Bring a case for your contacts, glasses or hearing aids, a denture cup will be supplied. Leave your suitcase in the car. After surgery it may be brought to your room. For patients admitted to the hospital, discharge time is determined by your treatment team.   Patients discharged the day of surgery will not be  allowed to drive home.    __X__ Take these medicines the morning of surgery with A SIP OF WATER:    1. allopurinol (ZYLOPRIM) 100 MG tablet  2. buPROPion (WELLBUTRIN SR) 150 MG 12 hr tablet  3. gabapentin (NEURONTIN) 300 MG capsule  4. hydrALAZINE (APRESOLINE) 12.5 MG tablet  5. mycophenolate (MYFORTIC) 360 MG EC tablet  6. omeprazole (PRILOSEC) 20 MG capsule  7. predniSONE (DELTASONE) 5 MG tablet  8. rosuvastatin (CRESTOR) 10 MG tablet  9. tacrolimus (PROGRAF) 2 MG capsule  10. tamsulosin (FLOMAX) 0.4 MG CAPS capsule  ____ Fleet Enema (as directed)   __X__ Use CHG Soap/SAGE wipes as directed  __X__ Use inhalers on the day of surgery  __x__ Stop metformin/Janumet/Farxiga 2 days prior to surgery    __X__ Take 1/2 of usual insulin dose the night before surgery. No insulin the morning          of surgery.   __X__ Stop Blood Thinners Coumadin/Plavix/Xarelto/Pleta/Pradaxa/Eliquis/Effient/Aspirin  hold Eliquis as instructed  __X__  Stop Anti-inflammatories 7 days before surgery such as Advil, Ibuprofen, Motrin,  BC or Goodies Powder, Naprosyn, Naproxen, Aleve, Aspirin    __X__ Stop all herbals and supplements, fish oil or vitamins  until after surgery.    __X__ Bring C-Pap to the hospital.    Prostatic Urethral Lift  Prostatic urethral lift is a surgical procedure to treat symptoms of prostate gland enlargement that occurs with age (benign prostatic hypertrophy, BPH). The urethra passes between the two lobes of the prostate. The urethra is the part of the body that drains urine from the bladder. As the prostate enlarges, it can push on the urethra and cause problems with urinating. This procedure involves placing an implant that holds the prostate away from the urethra. The procedure is done using a thin device called a cystoscope. The device is inserted through the tip of the penis and moved up the urethra to the prostate. This is less invasive than other procedures that require an  incision. You may have this procedure if: You have symptoms of BPH. Your prostate is not severely enlarged. Medicines to treat BPH are not working or not tolerated. You want to avoid possible sexual side effects from medicines or other procedures that are used to treat BPH. Tell a health care provider about: Any allergies you have. All medicines you are taking, including vitamins, herbs, eye drops, creams, and over-the-counter medicines. Any problems you or family members have had with anesthetic medicines. Any bleeding problems you have. Any surgeries you have had. Any medical conditions you have. What are the risks? Generally, this is a safe procedure. However, problems may occur, including: Bleeding. Infection. Leaking of urine (incontinence). Allergic reactions to medicines. Return of BPH symptoms after 2 years, requiring more treatment. What happens before the procedure? When to stop eating and drinking Follow instructions from your health care provider about what you may eat and drink before your procedure. These may include: 8 hours before your procedure Stop eating most foods. Do not eat meat, fried foods, or fatty foods. Eat only light foods, such as toast or crackers. All liquids are okay except energy drinks and alcohol. 6 hours before your procedure Stop eating. Drink only clear liquids, such as water, clear fruit juice, black coffee, plain tea, and sports drinks. Do not drink energy drinks or alcohol. 2 hours before your procedure Stop drinking all liquids. You may be allowed to take medicines with small sips of water. If you do not follow your health care provider's instructions, your procedure may be delayed or canceled. Medicines Ask your health care provider about: Changing or stopping your regular medicines. This is especially important if you are taking diabetes medicines or blood thinners. Taking medicines such as aspirin and ibuprofen. These medicines can  thin your blood. Do not take these medicines unless your health care provider tells you to take them. Taking over-the-counter medicines, vitamins, herbs, and supplements. Surgery safety Ask your health care provider what steps will be taken to help prevent infection. These steps may include: Removing hair at the surgery site. Washing skin with a germ-killing soap. Taking antibiotic medicine. General instructions Do not use any products that contain nicotine or tobacco for at least 4 weeks before the procedure. These products include cigarettes, chewing tobacco, and vaping devices, such as e-cigarettes. If you need help quitting, ask your health care provider. If you will be going home right after the procedure, plan to have a responsible adult: Take you home from the hospital or clinic. You  will not be allowed to drive. Care for you for the time you are told. What happens during the procedure? An IV may be inserted into one of your veins. You will be given one or more of the following: A medicine to help you relax (sedative). A medicine that is injected into your urethra to numb the area (local anesthetic). A medicine to make you fall asleep (general anesthetic). A cystoscope will be inserted into your penis and moved through your urethra to your prostate. A device will be inserted through the cystoscope and used to press the lobes of your prostate away from your urethra. Implants will be inserted through the device to hold the lobes of your prostate in the widened position. The device and cystoscope will be removed. The procedure may vary among health care providers and hospitals. What happens after the procedure? Your blood pressure, heart rate, breathing rate, and blood oxygen level be monitored until you leave the hospital or clinic. If you were given a sedative during the procedure, it can affect you for several hours. Do not drive or operate machinery until your health care provider  says that it is safe. Summary Prostatic urethral lift is a surgical procedure to relieve symptoms of prostate gland enlargement that occurs with age (benign prostatic hypertrophy, BPH). The procedure is performed with a thin device called a cystoscope. This device is inserted through the tip of the penis and moved up the urethra to reach the prostate. This is less invasive than other procedures that require an incision. If you will be going home right after the procedure, plan to have a responsible adult take you home from the hospital or clinic. You will not be allowed to drive. This information is not intended to replace advice given to you by your health care provider. Make sure you discuss any questions you have with your health care provider. Document Revised: 10/06/2020 Document Reviewed: 10/06/2020 Elsevier Patient Education  [place].

## 2021-12-14 ENCOUNTER — Other Ambulatory Visit: Payer: 59

## 2021-12-14 ENCOUNTER — Other Ambulatory Visit: Payer: Self-pay

## 2021-12-14 DIAGNOSIS — N138 Other obstructive and reflux uropathy: Secondary | ICD-10-CM

## 2021-12-14 DIAGNOSIS — N401 Enlarged prostate with lower urinary tract symptoms: Secondary | ICD-10-CM | POA: Diagnosis not present

## 2021-12-14 LAB — MICROSCOPIC EXAMINATION: Bacteria, UA: NONE SEEN

## 2021-12-14 LAB — URINALYSIS, COMPLETE
Bilirubin, UA: NEGATIVE
Ketones, UA: NEGATIVE
Leukocytes,UA: NEGATIVE
Nitrite, UA: NEGATIVE
Protein,UA: NEGATIVE
RBC, UA: NEGATIVE
Specific Gravity, UA: 1.015 (ref 1.005–1.030)
Urobilinogen, Ur: 0.2 mg/dL (ref 0.2–1.0)
pH, UA: 5 (ref 5.0–7.5)

## 2021-12-17 ENCOUNTER — Telehealth: Payer: Self-pay | Admitting: Internal Medicine

## 2021-12-17 ENCOUNTER — Other Ambulatory Visit: Payer: Self-pay

## 2021-12-17 DIAGNOSIS — Z7960 Long term (current) use of unspecified immunomodulators and immunosuppressants: Secondary | ICD-10-CM | POA: Diagnosis not present

## 2021-12-17 DIAGNOSIS — M109 Gout, unspecified: Secondary | ICD-10-CM | POA: Diagnosis not present

## 2021-12-17 DIAGNOSIS — Z7952 Long term (current) use of systemic steroids: Secondary | ICD-10-CM | POA: Diagnosis not present

## 2021-12-17 DIAGNOSIS — M0579 Rheumatoid arthritis with rheumatoid factor of multiple sites without organ or systems involvement: Secondary | ICD-10-CM | POA: Diagnosis not present

## 2021-12-17 LAB — CULTURE, URINE COMPREHENSIVE

## 2021-12-17 MED ORDER — COLCHICINE 0.6 MG PO TABS
ORAL_TABLET | ORAL | 5 refills | Status: DC
  Filled 2021-12-17: qty 30, 60d supply, fill #0

## 2021-12-17 MED ORDER — PREDNISONE 10 MG PO TABS
ORAL_TABLET | ORAL | 0 refills | Status: DC
  Filled 2021-12-17: qty 20, 8d supply, fill #0

## 2021-12-17 NOTE — Telephone Encounter (Signed)
[name] RN with access nurse said that pt has severe gout pain in lt elbow and lt lower leg at ankle; pt said symptoms started on 12/16/21 and has gotten a lot worse; pt said saw rheumatologist earlier today but pain was not as bad. Offerred pt appt with Dr Silvio Pate on 12/18/21 at 11:30 pt said he cannot come to office due pt pain is so bad in lt leg he cannot bear weight. Pt said the areas are red, swollen, warm to touch and very painful. Pt said he is not going by EMS to ED. Pt requesting pain me to [facility] employee pharmacy.  Sending note to Dr Manning Charity CMA and will teams [facility].   [place] [facility] - Client TELEPHONE ADVICE RECORD AccessNurse Patient Name: [name] [place] Gender: Male DOB: 1960-10-25 Age: 61 Y 3 M 7 D Return Phone Number: [phone] (Primary), [phone] (Secondary) Address: City/ State/ Zip: [place]  [address] Client [place] [facility] - Client Client Site [place] - Day Contact Type Call Who Is Calling Patient / Member / Family / Caregiver Call Type Triage / Clinical Relationship To Patient Self Return Phone Number [phone] (Primary) Chief Complaint Leg Pain Reason for Call Symptomatic / Request for [name] states that that he is dealing with severe pain in his left leg and left elbow from gout and he is also unable to walk. He advised that it is very swollen, red and is warm to touch. Translation No Nurse Assessment Nurse: Laurance Flatten, RN, Geni Bers Date/Time (Eastern Time): 12/17/2021 4:05:35 PM Confirm and document reason for call. If symptomatic, describe symptoms. ---Caller stated he has gout in his left elbow and in his left ankle and is currently taking gout medication. Caller stated the pain is severe and is wanting something for the pain. Does the patient have any new or worsening symptoms? ---Yes Will a triage be completed?  ---Yes Related visit to physician within the last 2 weeks? ---No Does the PT have any chronic conditions? (i.e. diabetes, asthma, this includes High risk factors for pregnancy, etc.) ---Yes List chronic conditions. ---Gout, HTN, Diabetes, Hx of kidney transplant 13 yrs ago Is this a behavioral health or substance abuse call? ---No Guidelines Guideline Title Affirmed Question Affirmed Notes Nurse Date/Time (Eastern Time) Elbow Pain [1] SEVERE pain AND [2] not improved 2 hours after pain medicine Laurance Flatten, RN, Geni Bers 12/17/2021 4:08:25 PM PLEASE NOTE: All timestamps contained within this report are represented as [place] Standard Time. CONFIDENTIALTY NOTICE: This fax transmission is intended only for the addressee. It contains information that is legally privileged, confidential or otherwise protected from use or disclosure. If you are not the intended recipient, you are strictly prohibited from reviewing, disclosing, copying using or disseminating any of this information or taking any action in reliance on or regarding this information. If you have received this fax in error, please notify us immediately by telephone so that we can arrange for its return to [name]. Phone: [phone], Toll-Free: [phone], Fax: [phone] Page: 2 of 2 Call Id: [id number] [place]. Time Eilene Ghazi Time) Disposition Final User 12/17/2021 4:16:47 PM See HCP within 4 Hours (or PCP triage) Yes Laurance Flatten, RN, Geni Bers Final Disposition 12/17/2021 4:16:47 PM See HCP within 4 Hours (or PCP triage) Yes Laurance Flatten, RN, Althea Grimmer Disagree/Comply Comply Caller Understands Yes PreDisposition InappropriateToAsk Care Advice Given Per Guideline SEE HCP (OR PCP TRIAGE) WITHIN 4 HOURS: * IF OFFICE WILL BE OPEN: You need to be  seen within the next 3 or 4 hours. Call your doctor (or NP/PA) now or as soon as the office opens. PAIN MEDICINES: * For pain relief, you can take either acetaminophen, ibuprofen, or naproxen. *  They are over-the-counter (OTC) pain drugs. You can buy them at the drugstore. CALL BACK IF: * You become worse Referrals Warm transfer to backlin

## 2021-12-17 NOTE — Telephone Encounter (Signed)
Patient called and stated that he is dealing with gout and in his left leg and elbow and stated he is having serious pain and can barely walk. Patient was sent to access nurse.

## 2021-12-17 NOTE — Telephone Encounter (Signed)
[name], MD to Me      12/17/21  4:40 PM  Please let him know that Dr Irwin Brakeman (rheum) sent him prescription for prednisone---which is just what I would have given him.  He should get that filled and start it today   Pt notified as instructed and voiced understanding.pt said he was hoping for pain med until prednisone takes over. Pts wife said trying to keep pt out of ED and pt cannot bear wt on leg. Pt request another note sent to Dr Silvio Pate requesting pain med to [facility] employee pharmacy. Pt understands Dr Silvio Pate is not in office now.

## 2021-12-18 ENCOUNTER — Other Ambulatory Visit: Payer: Self-pay

## 2021-12-18 MED ORDER — OXYCODONE HCL 5 MG PO TABS
5.0000 mg | ORAL_TABLET | ORAL | 0 refills | Status: DC | PRN
  Filled 2021-12-18: qty 20, 3d supply, fill #0

## 2021-12-18 MED FILL — Bupropion HCl Tab ER 12HR 150 MG: ORAL | 90 days supply | Qty: 180 | Fill #2 | Status: AC

## 2021-12-18 NOTE — Telephone Encounter (Signed)
Okay Hopefully he is already improving with the prednisone--but I sent some oxycodone for him

## 2021-12-18 NOTE — Telephone Encounter (Signed)
Pt notified as instructed and pt voiced understanding and was appreciative. So far pt can not see any improvement but hopes will see improvement soon.

## 2021-12-19 DIAGNOSIS — M8588 Other specified disorders of bone density and structure, other site: Secondary | ICD-10-CM | POA: Diagnosis not present

## 2021-12-21 ENCOUNTER — Other Ambulatory Visit (HOSPITAL_COMMUNITY): Payer: Self-pay

## 2021-12-21 NOTE — Progress Notes (Signed)
[name] stating he has been feeling well except for gout.  He has been on several trips.  He has had blood work and several doctor visits.  Kidney function has improved.  He denies any chest pain, increased shortness of breath, headaches or dizziness.  He has all his medications and aware of how to take them.  He has everything for daily living.  Lives with wife and she is his support person.  He is aware of up coming appts. Will continue to visit for heart failure, diet and medication management.   [place] [phone]

## 2021-12-24 ENCOUNTER — Encounter: Payer: Self-pay | Admitting: Urology

## 2021-12-24 NOTE — Progress Notes (Signed)
Perioperative Services  Pre-Admission/Anesthesia Testing Clinical Review  Date: 12/24/21  Patient Demographics:  Name: [name]. DOB:   10/19/1960 MRN:   [id number]  Planned Surgical Procedure(s):    Case: [id number] Date/Time: 12/25/21 1015   Procedure: CYSTOSCOPY WITH INSERTION OF UROLIFT   Anesthesia type: Monitor Anesthesia Care   Pre-op diagnosis: Benign Prostatic Hyperplasia with Urinary Obstruction   Location: [facility] OR ROOM 10 / [place] ORS FOR ANESTHESIA GROUP   Surgeons: [name], MD   NOTE: Available PAT nursing documentation and vital signs have been reviewed. Clinical nursing staff has updated patient's PMH/PSHx, current medication list, and drug allergies/intolerances to ensure comprehensive history available to assist in medical decision making as it pertains to the aforementioned surgical procedure and anticipated anesthetic course. Extensive review of available clinical information performed. [place] PMH and PSHx updated with any diagnoses/procedures that  may have been inadvertently omitted during his intake with the pre-admission testing department's nursing staff.  Clinical Discussion:  Carl Dantre Yearwood. is a 61 y.o. male who is submitted for pre-surgical anesthesia review and clearance prior to him undergoing the above procedure. Patient is a Former Smoker (quit 08/1994). Pertinent PMH includes: CAD (s/p CABG), ischemic cardiomyopathy, CHF, aortic valve stenosis, mitral valve stenosis, atrial fibrillation, pulmonary hypertension, aortic atherosclerosis, HTN, HLD, T2DM, ESRD/CKD (s/p cadaveric renal transplant), asthma, obesity related hypoventilation syndrome, OSAH (requires nocturnal PAP therapy), GERD (on daily PPI), anemia, ADD, depression.   Patient is followed by cardiology Fletcher Anon, MD). He was last seen in the cardiology clinic on 12/07/2021; notes reviewed.  At the time of this clinic visit, patient doing well overall from a cardiovascular  perspective.  He denied any episodes of chest pain, however continued to experience exertional dyspnea.  Patient denied any PND, orthopnea, palpitations, significant peripheral edema, vertiginous symptoms, or presyncope/syncope.  Patient with a past medical history significant for cardiovascular diagnoses.  Of note, complete records regarding cardiovascular history not available for review at time of consult.  Information gathered from patient and notes from local cardiology providers.  Patient reportedly underwent diagnostic LEFT heart catheterization in 1996, which revealed high-grade stenosis of the mid LAD.  PTCA/PCI was subsequently performed placing a single stent (unknown type).  Procedure was complicated by the development of an RIGHT common femoral artery pseudoaneurysm that required surgical intervention.  Repeat diagnostic LEFT heart catheterization performed on 08/10/2002 revealing multivessel CAD; 95% LAD, 70% ISR of previously placed LAD stent, 80% proximal OM, and 70% mid RCA.  Due to the complexity of patient's anatomy and severity of disease, patient was referred to CVTS for consideration of revascularization procedure.  Patient underwent four-vessel CABG on 08/13/2002.  LIMA-LAD, LRA-OM, SVG-D1, and SVG-RCA bypass grafts were placed.  Patient dialysis dependent for approximately 8 years (2004-2012).  He underwent a cadaveric donor renal transplant on 09/13/2010.  Most recent diagnostic RIGHT heart catheterization was performed on 05/03/2019 revealing severely elevated filling pressures and severe pulmonary hypertension.  Study was performed following several days of intravenous diuresis. Mean PA pressure 51 mmHg, mean PCWP 26 mmHg, PA saturation 65%, AO saturation 95%, CO 7.69 l/min, and CI 3.26 L/min/m.  Most recent myocardial perfusion imaging study was performed on 03/09/2021.  Patient only able to achieve 49% MPHR due to mild shortness of breath and chest tightness, which improved  1 minute into recovery.  Study showed moderately reduced left ventricular systolic function with an EF of 44%.  There was global hypokinesis noted.  End-diastolic cavity size moderately enlarged.  Perfusion  was abnormal revealing a small moderate fixed defect in the apical inferior location.  There was no evidence of stress-induced arrhythmia; baseline ECG revealed atrial fibrillation (chronic). There was no reversible ischemia.  Study was low risk.  Most recent TTE performed on 11/07/2021 revealed a mildly reduced left ventricular systolic function with an EF of 40-45%.  There was global hypokinesis with septal lateral dyssynchrony consistent with LBBB.  Mild concentric LVH.  Diastolic Doppler parameters indeterminant.  Right ventricular systolic size and function mildly abnormal.  Tricuspid regurgitation signal inadequate for assessing PA pressure.  There was mild to moderate biatrial enlargement.  Mild to moderate mitral valve stenosis noted with a mean pressure gradient of 6.0 mmHg.  There was severe mitral annular calcification.  Aortic valve also calcified with moderate stenosis noted; AVA (VTI) 1.06 cm; mean pressure gradient across the aortic valve was 15 mmHg.  Patient with an atrial fibrillation diagnosis; CHA2DS2-VASc Score = 4 (CHF, HTN, vascular disease history, T2DM).rate and rhythm currently being maintained without pharmacological intervention.  Patient is chronically anticoagulated with standard dose apixaban.  Blood pressure mildly elevated at 148/78 on currently prescribed vasodilator (hydralazine) and diuretic (torsemide + metolazone) therapies.  Patient is on rosuvastatin for his HLD diagnosis and further ASCVD prevention.  T2DM well-controlled on currently prescribed regimen; last HgbA1c was 6.1% when checked on 11/02/2021.  In the setting of known CHF and concurrent diabetes, patient is also on an SGLT2i (dapagliflozin).  Pulmonary hypertension being managed with the aforementioned  interventions in addition to PDE5i (tadalafil).  Patient does have an OSAH diagnosis and is compliant with prescribed nocturnal BiPAP therapy.  Functional capacity limited by multiple medical comorbidities, however patient still felt to be able to complete at least 4 METS of activity without experiencing any significant angina/anginal equivalent symptoms.  No changes were made to his medication regimen.  Patient to follow-up with outpatient cardiology in 2 to 3 months or sooner if needed.  Carl Jandel Patriarca. is scheduled for an CYSTOSCOPY WITH INSERTION OF UROLIFT on 12/25/2021 with Dr. John Giovanni, MD.  Given patient's past medical history significant for cardiovascular and cardiopulmonary diagnoses, previous record clearance was sought from both patient's cardiologist and pulmonary medicine providers.  Specialty clearances were obtained as follows:  Per pulmonary medicine, "patient is cleared from a pulmonary standpoint for urological procedure as planned.  He is aware that he should wear his BiPAP in recovery and should be monitored for hypoxemia/respiratory distress".  Per cardiology, "based ACC/AHA guidelines, the patient's past medical history, and the amount of time since his last clinic visit, this patient would be at an overall ACCEPTABLE risk for the planned procedure without further cardiovascular testing or intervention at this time".  Again, this patient is on daily anticoagulation therapy.  He has been cleared to hold his apixaban dose for 2 days prior to his procedure with plans to restart since postoperatively respectively minimized by his primary attending surgeon.  The patient is aware that his last dose of apixaban should be on 12/22/2021.  Patient denies previous perioperative complications with anesthesia in the past. In review of the available records, it is noted that patient underwent a MAC anesthetic course at [facility] ([facility]) in 11/18/2017 without documented  complications.      12/13/2021   12:22 PM 12/07/2021   10:12 AM 12/06/2021    9:53 AM  Vitals with BMI  Height _0   _1   Weight 253 lbs 252 lbs 10 oz 253 lbs 3 oz  BMI 78.2  42.35  Systolic  361 443  Diastolic  78 82  Pulse  72 59    Providers/Specialists:   NOTE: Primary physician provider listed below. Patient may have been seen by APP or partner within same practice.   PROVIDER ROLE / SPECIALTY LAST Claud Kelp, MD Urology (Surgeon) 11/21/2021  [name], MD Primary Care Provider 10/30/2021  [name], MD Cardiology 04/19/2021  Loralie Champagne, MD Advanced Heart Failure 12/07/2021  Baird Lyons, MD Pulmonary Medicine 12/06/2021   Allergies:  Contrast media [iodinated contrast media], Iodine, and Ibuprofen  Current Home Medications:   No current facility-administered medications for this encounter.    albuterol (PROVENTIL) (2.5 MG/3ML) 0.083% nebulizer solution   albuterol (VENTOLIN HFA) 108 (90 Base) MCG/ACT inhaler   allopurinol (ZYLOPRIM) 100 MG tablet   apixaban (ELIQUIS) 5 MG TABS tablet   beclomethasone (QVAR) 80 MCG/ACT inhaler   buPROPion (WELLBUTRIN SR) 150 MG 12 hr tablet   cholecalciferol (VITAMIN D3) 25 MCG (1000 UNIT) tablet   colchicine 0.6 MG tablet   colchicine 0.6 MG tablet   Continuous Blood Gluc Sensor (FREESTYLE LIBRE 3 SENSOR) MISC   dapagliflozin propanediol (FARXIGA) 10 MG TABS tablet   fluticasone (FLONASE) 50 MCG/ACT nasal spray   gabapentin (NEURONTIN) 300 MG capsule   hydrALAZINE (APRESOLINE) 25 MG tablet   hydrocortisone 2.5 % cream   insulin glargine-yfgn (SEMGLEE) 100 UNIT/ML Pen   insulin lispro (HUMALOG KWIKPEN) 100 UNIT/ML KwikPen   Insulin Pen Needle (UNIFINE PENTIPS) 31G X 5 MM MISC   iron polysaccharides (FERREX 150) 150 MG capsule   lidocaine (LIDODERM) 5 %   metolazone (ZAROXOLYN) 2.5 MG tablet   montelukast (SINGULAIR) 10 MG tablet   Multiple Vitamin (MULTIVITAMIN WITH MINERALS) TABS tablet    mycophenolate (MYFORTIC) 180 MG EC tablet   omeprazole (PRILOSEC) 20 MG capsule   oxyCODONE (OXY IR/ROXICODONE) 5 MG immediate release tablet   Potassium Chloride CR (MICRO-K) 8 MEQ CPCR capsule CR   predniSONE (DELTASONE) 10 MG tablet   predniSONE (DELTASONE) 5 MG tablet   rosuvastatin (CRESTOR) 10 MG tablet   sulfamethoxazole-trimethoprim (BACTRIM) 400-80 MG tablet   tacrolimus (PROGRAF) 1 MG capsule   tadalafil, PAH, (ADCIRCA) 20 MG tablet   tamsulosin (FLOMAX) 0.4 MG CAPS capsule   torsemide (DEMADEX) 100 MG tablet   zinc sulfate 220 (50 Zn) MG capsule   History:   Past Medical History:  Diagnosis Date   Anemia    Aortic atherosclerosis (HCC)    Asthma, persistent controlled 02/25/2013   Attention or concentration deficit 04/26/2021   BPH with obstruction/lower urinary tract symptoms 09/25/2014   CAD (coronary artery disease) 2005   a.) LHC 1996 -> HG stenosis mLAD -> PTCA/PCI with stent x 1 (unk type) -> complicated by CFA pseudoanurysm (required surg);  b.) LHC 08/10/2002  -> 95% LAD, 70% ISR LAD, 80% pOM, 70% mRCA --> CVTS consult; c.) 4v CABG 08/13/2002; c.) 03/2018 MV: Small, fixed inferoapical and apical sep defect. No isc. EF 47% (60-65% by 05/2018 TTE); d.) 02/2021 MV: small Apical inf fixed defect. No isc -> low risk.   Cellulitis and abscess of left leg 07/22/2020   Chronic atrial fibrillation    a.) CHA2DS2VASc = 4 (CHF, HTN, vascular disease history, T2DM);  b.) rate/rhythm maintained without pharmacological intervention; chronically anticoagulated with apixaban   Chronic combined systolic and diastolic CHF (congestive heart failure)    a.  7/18 Echo: EF 45-50%; b. 05/2018 Echo: EF 60-65%, RVSP 74.8; c.  12/2018 Echo: EF 50-55%. Sev dil LA; d. 04/2019 Echo: EF 45-50%; e. 07/2021 Echo: EF 40-45%, GrIII DD; f. 10/2021 Echo: EF 40-45%, mild conc LVH, septal-lat dyssynchrony (LBBB), mildly reduced RV fxn, mild-mod dil LA, mildly dil RA, mild-mod MS, mod AS.   Chronic venous stasis  dermatitis of both lower extremities    Cytomegaloviral disease 2017   Diverticulosis of colon 04/22/2013   ESRD (end stage renal disease) ([place])    a.) dialysis dependent 2004-2012; b.) s/p cadaveric RIGHT renal transplant 09/13/2010   Essential hypertension 12/17/2010   GERD (gastroesophageal reflux disease)    Gout    History of 2019 novel coronavirus disease (COVID-19) 06/30/2019   History of renal transplant 09/13/2010   a.) s/p cadaveric donor transplant 09/13/2010   Hx of bilateral cataract extraction 10/2017   Hyperlipidemia    Hypogonadism in male 09/25/2014   Ischemic cardiomyopathy    Left cephalic vein thrombosis 69/6789   Long term current use of anticoagulant    a.) apixaban   Long term current use of immunosuppressive drug    a.) mycophenolate + prednisone + tacrolimus   Major depressive disorder 10/04/2013   Mitral stenosis 11/07/2021   a. TTE 11/07/2021: severe MAC, mild-mod MS (MPG 6 mmHg)   Moderate aortic stenosis    a. 10/2021 Echo: Mod AS. AVA 1.06cm^2 (VTI). Mean grad 18mHg.   Obesity hypoventilation syndrome 03/31/2012   OSA treated with BiPAP 09/29/2012   PAH (pulmonary artery hypertension)    a. 04/2019 RHC: RA 19, RV 80/20, PA 78/31 (51), PCWP 25, CO/CI 7.69/3.26. PVR 3.25 WU-->Sev PAH, likely primarily PV HTN.   S/P CABG x 4 08/13/2002   a.) LIMA-LAD, LRA-OM, SVG-D1, SVG-dRCA   S/P gastric bypass    Synovitis of finger 06/11/2019   Trigger finger, unspecified little finger 12/23/2018   Type 2 diabetes mellitus with hyperglycemia, with long-term current use of insulin 09/09/2014   Ulcer 05/2016   Left shin   Wears glasses    Past Surgical History:  Procedure Laterality Date   CARDIAC CATHETERIZATION N/A 08/10/2002   CATARACT EXTRACTION W/PHACO Right 10/29/2017   Procedure: CATARACT EXTRACTION PHACO AND INTRAOCULAR LENS PLACEMENT ([place]  RIGHT DIABETIC;  Surgeon: BLeandrew Koyanagi MD;  [place]  Service: Ophthalmology;   Laterality: Right;  Diabetic - insulin   CATARACT EXTRACTION W/PHACO Left 11/18/2017   Procedure: CATARACT EXTRACTION PHACO AND INTRAOCULAR LENS PLACEMENT ([place] IV[place]/TOPICAL;  Surgeon: BLeandrew Koyanagi MD;  [place] [facility]  Service: Ophthalmology;  Laterality: Left;  DIABETES - insulin sleep apnea   COLONOSCOPY WITH PROPOFOL N/A 04/22/2013   Procedure: COLONOSCOPY WITH PROPOFOL;  Surgeon: DMilus Banister MD;  Location: [facility];  Service: Endoscopy;  Laterality: N/A;   CORONARY ANGIOPLASTY WITH STENT PLACEMENT N/A 1996   CORONARY ARTERY BYPASS GRAFT N/A 08/13/2002   Procedure: 4v CORONARY ARTERY BYPASS GRAFTING; [facility] Surgeon: CAllegra Lai MD   DG ANGIO AV SHUNT*L*     right and left upper arms   FASCIOTOMY  03/03/2012   Procedure: FASCIOTOMY;  Surgeon: GWynonia Sours MD;  [place] [facility]  Service: Orthopedics;  Laterality: Right;  FASCIOTOMY RIGHT SMALL FINGER   FASCIOTOMY Left 08/17/2013   Procedure: FASCIOTOMY LEFT RING;  Surgeon: GWynonia Sours MD;  [place] [facility]  Service: Orthopedics;  Laterality: Left;   INCISION AND DRAINAGE ABSCESS Left 10/15/2015   Procedure: INCISION AND DRAINAGE ABSCESS;  Surgeon: DJules Husbands MD;  Location: [facility] ORS;  Service: General;  Laterality: Left;   KIDNEY TRANSPLANT  09/13/2010   cadaver--at [facility] HEART CATH N/A 11/15/2016   Procedure: RIGHT HEART CATH;  Surgeon: [name], MD;  Location: [place] CV LAB;  Service: Cardiovascular;  Laterality: N/A;   RIGHT HEART CATH N/A 05/03/2019   Procedure: RIGHT HEART CATH;  Surgeon: [name], MD;  Location: [place] CV LAB;  Service: Cardiovascular;  Laterality: N/A;   ROUX-EN-Y GASTRIC BYPASS N/A 2015   TYMPANIC MEMBRANE REPAIR Left 03/2010   VASECTOMY     Family History  Problem Relation Age of Onset   Heart disease Father    Kidney failure Father    Kidney disease Father    Diabetes  Maternal Grandmother    Breast cancer Maternal Grandmother    Valvular heart disease Mother    Liver cancer Paternal Uncle    Liver cancer Paternal Grandmother    Prostate cancer Neg Hx    Social History   Tobacco Use   Smoking status: Former    Types: Cigars    Quit date: 09/02/1994    Years since quitting: 27.3    Passive exposure: Never   Smokeless tobacco: Never   Tobacco comments:    Occasional, rare cigars  Vaping Use   Vaping Use: Never used  Substance Use Topics   Alcohol use: Yes    Alcohol/week: 0.0 - 1.0 standard drinks of alcohol    Comment: occasional - 12-pack lasts a month   Drug use: No    Pertinent Clinical Results:  LABS: Labs reviewed: Acceptable for surgery.  Lab Results  Component Value Date   WBC 3.6 (L) 11/03/2021   HGB 9.6 (L) 11/03/2021   HCT 32.4 (L) 11/03/2021   MCV 83.1 11/03/2021   PLT 120 (L) 11/03/2021   Lab Results  Component Value Date   NA 141 12/07/2021   K 4.2 12/07/2021   CO2 32 12/07/2021   GLUCOSE 120 (H) 12/07/2021   BUN 42 (H) 12/07/2021   CREATININE 1.78 (H) 12/07/2021   CALCIUM 9.3 12/07/2021   GFRNONAA 43 (L) 12/07/2021   Component Date Value Ref Range Status   Urine Culture, Comprehensive 12/14/2021 Final report   Final   Organism ID, Bacteria 12/14/2021 Comment   Final   Comment: Mixed urogenital flora 100 Colonies/mL                                                      .    Specific Gravity, UA 12/14/2021 1.015  1.005 - 1.030 Final   pH, UA 12/14/2021 5.0  5.0 - 7.5 Final   Color, UA 12/14/2021 Yellow  Yellow Final   Appearance Ur 12/14/2021 Clear  Clear Final   Leukocytes,UA 12/14/2021 Negative  Negative Final   Protein,UA 12/14/2021 Negative  Negative/Trace Final   Glucose, UA 12/14/2021 1+ (A)  Negative Final   Ketones, UA 12/14/2021 Negative  Negative Final   RBC, UA 12/14/2021 Negative  Negative Final   Bilirubin, UA 12/14/2021 Negative  Negative Final   Urobilinogen, Ur 12/14/2021 0.2  0.2 - 1.0 mg/dL  Final   Nitrite, UA 12/14/2021 Negative  Negative Final   Microscopic Examination 12/14/2021 See below:   Final   WBC, UA 12/14/2021 0-5  0 - 5 /hpf Final   RBC, Urine 12/14/2021 0-2  0 - 2 /hpf Final   Epithelial Cells (non renal) 12/14/2021 0-10  0 - 10 /hpf Final   Casts 12/14/2021 Present (A)  None seen /lpf Final   Cast Type 12/14/2021 Hyaline casts  N/A Final   Bacteria, UA 12/14/2021 None seen  None seen/Few Final    ECG: Date: 12/07/2021 Time ECG obtained: 1019 AM Rate: 75 bpm Rhythm:  Atrial fibrillation; NS IVCD Axis (leads I and aVF): Normal Intervals: QRS 144 ms. QTc 502 ms. ST segment and T wave changes: No evidence of acute ST segment elevation or depression Comparison: Similar to previous tracing obtained on 11/02/2021   IMAGING / PROCEDURES: TRANSTHORACIC ECHOCARDIOGRAM performed on 11/07/2021 Left ventricular ejection fraction, by estimation, is 40 to 45%. The left ventricle has mildly decreased function. The left ventricle demonstrates global hypokinesis with septal-lateral dyssynchrony consistent with LBBB. There is mild concentric left  ventricular hypertrophy. Left ventricular diastolic parameters are indeterminate.  Right ventricular systolic function is mildly reduced. The right ventricular size is mildly enlarged. Tricuspid regurgitation signal is inadequate for assessing PA pressure.  Left atrial size was mild to moderately dilated.  Right atrial size was mildly dilated.  The mitral valve is degenerative. No evidence of mitral valve regurgitation. Mild to moderate mitral stenosis. The mean mitral valve gradient is 6.0 mmHg. Severe mitral annular calcification.  The aortic valve is tricuspid. There is moderate calcification of the aortic valve. Aortic valve regurgitation is not visualized. Moderate aortic valve stenosis. Aortic valve area, by VTI measures 1.06 cm. Aortic valve mean gradient measures 15.0 mmHg.  The inferior vena cava is dilated in size with <50%  respiratory variability, suggesting right atrial pressure of 15 mmHg.   CT ABDOMEN PELVIS WO CONTRAST performed on 11/02/2021 Mild inflammatory stranding is noted around the pancreatic head and body concerning for acute pancreatitis. No pseudocyst formation is noted. Severe bilateral renal atrophy is noted consistent with history of end-stage renal disease. Right lower quadrant renal transplant is noted without hydronephrosis. However, there is a 9.5 x 7.9 cm ill-defined area of fat stranding in the left pararenal space inferior and lateral to the left kidney most consistent with focal inflammation or infarction. No definite fluid collection is seen at this time. Moderate splenomegaly is noted. Aortic atherosclerosis    MYOCARDIAL PERFUSION IMAGING STUDY (LEXISCAN) performed on 03/09/2021 Mildly reduced left ventricular systolic function with an EF of 44% Baseline ECG revealed atrial fibrillation No ST or T wave changes noted during stress LV perfusion is abnormal, however there was no evidence of significant reversible ischemia.  There was evidence of infarction.  Small defect with moderate reduction of uptake present in the apical inferior location that is fixed.  Left ventricular function is abnormal.  Global function is mildly reduced.  End-diastolic cavity size is moderately enlarged No significant changes from prior study Study determined to be low risk  RIGHT HEART CATHETERIZATION performed on 05/03/2019 Severe newly elevated right and left heart filling pressures Mean PA pressure = 51 mmHg Mean PCWP = 26 mmHg PA saturation = 65% AO saturation 95% CO 7.69 L/min CI 3.26 L/min/m PVR 3.25 [place] units Severe pulmonary hypertension, likely primary pulmonary venous hypertension Preserved cardiac output  Impression and Plan:  Carl Chaze Hruska. has been referred for pre-anesthesia review and clearance prior to him undergoing the planned anesthetic and procedural courses. Available  labs, pertinent testing, and imaging results were personally reviewed by me. This patient has been appropriately cleared by both cardiology and pulmonary medicine at an  overall LOW risk of experiencing significant perioperative cardiovascular/cardiopulmonary complications.    Based on clinical review performed today (12/24/21), barring any significant acute changes in the patient's overall condition, it is anticipated that he will be able to proceed with the planned surgical intervention. Any acute changes in clinical condition may necessitate his procedure being postponed and/or cancelled. Patient will meet with anesthesia team (MD and/or CRNA) on the day of his procedure for preoperative evaluation/assessment. Questions regarding anesthetic course will be fielded at that time.   Pre-surgical instructions were reviewed with the patient during his PAT appointment and questions were fielded by PAT clinical staff. Patient was advised that if any questions or concerns arise prior to his procedure then he should return a call to PAT and/or his surgeon's office to discuss.  Honor Loh, MSN, APRN, FNP-C, CEN [facility]  Peri-operative Services Nurse Practitioner Phone: [phone] Fax: [phone] 12/24/21 11:41 AM  NOTE: This note has been prepared using Dragon dictation software. Despite my best ability to proofread, there is always the potential that unintentional transcriptional errors may still occur from this process.

## 2021-12-25 ENCOUNTER — Ambulatory Visit
Admission: RE | Admit: 2021-12-25 | Discharge: 2021-12-25 | Disposition: A | Discharge status: Home / Self Care | Payer: 59 | Source: Ambulatory Visit | Attending: Urology | Admitting: Urology

## 2021-12-25 ENCOUNTER — Telehealth: Payer: Self-pay

## 2021-12-25 ENCOUNTER — Ambulatory Visit: Payer: 59 | Admitting: Urgent Care

## 2021-12-25 ENCOUNTER — Other Ambulatory Visit: Payer: Self-pay

## 2021-12-25 ENCOUNTER — Encounter: Payer: Self-pay | Admitting: Urology

## 2021-12-25 ENCOUNTER — Encounter
Admission: RE | Disposition: A | Discharge status: Home / Self Care | Payer: Self-pay | Source: Ambulatory Visit | Attending: Urology

## 2021-12-25 DIAGNOSIS — I2510 Atherosclerotic heart disease of native coronary artery without angina pectoris: Secondary | ICD-10-CM | POA: Insufficient documentation

## 2021-12-25 DIAGNOSIS — K219 Gastro-esophageal reflux disease without esophagitis: Secondary | ICD-10-CM | POA: Diagnosis not present

## 2021-12-25 DIAGNOSIS — Z794 Long term (current) use of insulin: Secondary | ICD-10-CM | POA: Diagnosis not present

## 2021-12-25 DIAGNOSIS — N401 Enlarged prostate with lower urinary tract symptoms: Secondary | ICD-10-CM | POA: Diagnosis not present

## 2021-12-25 DIAGNOSIS — G4733 Obstructive sleep apnea (adult) (pediatric): Secondary | ICD-10-CM | POA: Insufficient documentation

## 2021-12-25 DIAGNOSIS — F32A Depression, unspecified: Secondary | ICD-10-CM | POA: Diagnosis not present

## 2021-12-25 DIAGNOSIS — N138 Other obstructive and reflux uropathy: Secondary | ICD-10-CM | POA: Diagnosis not present

## 2021-12-25 DIAGNOSIS — Z940 Kidney transplant status: Secondary | ICD-10-CM | POA: Diagnosis not present

## 2021-12-25 DIAGNOSIS — I252 Old myocardial infarction: Secondary | ICD-10-CM | POA: Diagnosis not present

## 2021-12-25 DIAGNOSIS — Z0279 Encounter for issue of other medical certificate: Secondary | ICD-10-CM

## 2021-12-25 DIAGNOSIS — N186 End stage renal disease: Secondary | ICD-10-CM | POA: Diagnosis not present

## 2021-12-25 DIAGNOSIS — Z951 Presence of aortocoronary bypass graft: Secondary | ICD-10-CM | POA: Diagnosis not present

## 2021-12-25 DIAGNOSIS — E1122 Type 2 diabetes mellitus with diabetic chronic kidney disease: Secondary | ICD-10-CM | POA: Insufficient documentation

## 2021-12-25 DIAGNOSIS — E1151 Type 2 diabetes mellitus with diabetic peripheral angiopathy without gangrene: Secondary | ICD-10-CM | POA: Insufficient documentation

## 2021-12-25 DIAGNOSIS — I050 Rheumatic mitral stenosis: Secondary | ICD-10-CM | POA: Diagnosis not present

## 2021-12-25 DIAGNOSIS — I255 Ischemic cardiomyopathy: Secondary | ICD-10-CM | POA: Insufficient documentation

## 2021-12-25 DIAGNOSIS — I132 Hypertensive heart and chronic kidney disease with heart failure and with stage 5 chronic kidney disease, or end stage renal disease: Secondary | ICD-10-CM | POA: Diagnosis not present

## 2021-12-25 DIAGNOSIS — Z87891 Personal history of nicotine dependence: Secondary | ICD-10-CM | POA: Insufficient documentation

## 2021-12-25 DIAGNOSIS — I350 Nonrheumatic aortic (valve) stenosis: Secondary | ICD-10-CM | POA: Insufficient documentation

## 2021-12-25 DIAGNOSIS — G4730 Sleep apnea, unspecified: Secondary | ICD-10-CM | POA: Diagnosis not present

## 2021-12-25 DIAGNOSIS — J45909 Unspecified asthma, uncomplicated: Secondary | ICD-10-CM | POA: Insufficient documentation

## 2021-12-25 DIAGNOSIS — E876 Hypokalemia: Secondary | ICD-10-CM

## 2021-12-25 DIAGNOSIS — I2721 Secondary pulmonary arterial hypertension: Secondary | ICD-10-CM | POA: Insufficient documentation

## 2021-12-25 DIAGNOSIS — Z7984 Long term (current) use of oral hypoglycemic drugs: Secondary | ICD-10-CM | POA: Diagnosis not present

## 2021-12-25 DIAGNOSIS — I5042 Chronic combined systolic (congestive) and diastolic (congestive) heart failure: Secondary | ICD-10-CM | POA: Insufficient documentation

## 2021-12-25 HISTORY — PX: CYSTOSCOPY WITH INSERTION OF UROLIFT: SHX6678

## 2021-12-25 HISTORY — DX: Ischemic cardiomyopathy: I25.5

## 2021-12-25 HISTORY — DX: Long term (current) use of unspecified immunomodulators and immunosuppressants: Z79.60

## 2021-12-25 HISTORY — DX: Other long term (current) drug therapy: Z79.899

## 2021-12-25 HISTORY — DX: Bariatric surgery status: Z98.84

## 2021-12-25 HISTORY — DX: Long term (current) use of anticoagulants: Z79.01

## 2021-12-25 HISTORY — DX: Atherosclerosis of aorta: I70.0

## 2021-12-25 LAB — POCT I-STAT, CHEM 8
BUN: 69 mg/dL — ABNORMAL HIGH (ref 8–23)
Calcium, Ion: 1.1 mmol/L — ABNORMAL LOW (ref 1.15–1.40)
Chloride: 95 mmol/L — ABNORMAL LOW (ref 98–111)
Creatinine, Ser: 2.6 mg/dL — ABNORMAL HIGH (ref 0.61–1.24)
Glucose, Bld: 117 mg/dL — ABNORMAL HIGH (ref 70–99)
HCT: 33 % — ABNORMAL LOW (ref 39.0–52.0)
Hemoglobin: 11.2 g/dL — ABNORMAL LOW (ref 13.0–17.0)
Potassium: 3 mmol/L — ABNORMAL LOW (ref 3.5–5.1)
Sodium: 140 mmol/L (ref 135–145)
TCO2: 31 mmol/L (ref 22–32)

## 2021-12-25 LAB — GLUCOSE, CAPILLARY: Glucose-Capillary: 128 mg/dL — ABNORMAL HIGH (ref 70–99)

## 2021-12-25 SURGERY — CYSTOSCOPY WITH INSERTION OF UROLIFT
Anesthesia: General | Site: Prostate

## 2021-12-25 MED ORDER — OXYCODONE HCL 5 MG PO TABS
5.0000 mg | ORAL_TABLET | Freq: Once | ORAL | Status: AC | PRN
  Administered 2021-12-25: 5 mg via ORAL

## 2021-12-25 MED ORDER — LIDOCAINE HCL URETHRAL/MUCOSAL 2 % EX GEL
CUTANEOUS | Status: AC
  Filled 2021-12-25: qty 10

## 2021-12-25 MED ORDER — LIDOCAINE HCL (CARDIAC) PF 100 MG/5ML IV SOSY
PREFILLED_SYRINGE | INTRAVENOUS | Status: DC | PRN
  Administered 2021-12-25: 50 mg via INTRAVENOUS

## 2021-12-25 MED ORDER — STERILE WATER FOR IRRIGATION IR SOLN
Status: DC | PRN
  Administered 2021-12-25 (×2): 3000 mL

## 2021-12-25 MED ORDER — DEXMEDETOMIDINE HCL IN NACL 200 MCG/50ML IV SOLN
INTRAVENOUS | Status: DC | PRN
  Administered 2021-12-25: 8 ug via INTRAVENOUS

## 2021-12-25 MED ORDER — OXYCODONE HCL 5 MG/5ML PO SOLN: 5.0000 mg | Freq: Once | ORAL | Status: AC | PRN

## 2021-12-25 MED ORDER — OXYCODONE HCL 5 MG PO TABS
ORAL_TABLET | ORAL | Status: AC
  Filled 2021-12-25: qty 1

## 2021-12-25 MED ORDER — PROPOFOL 500 MG/50ML IV EMUL
INTRAVENOUS | Status: DC | PRN
  Administered 2021-12-25: 100 ug/kg/min via INTRAVENOUS

## 2021-12-25 MED ORDER — SODIUM CHLORIDE 0.9 % IV SOLN: INTRAVENOUS | Status: DC

## 2021-12-25 MED ORDER — FENTANYL CITRATE (PF) 100 MCG/2ML IJ SOLN
INTRAMUSCULAR | Status: AC
  Filled 2021-12-25: qty 2

## 2021-12-25 MED ORDER — FENTANYL CITRATE (PF) 100 MCG/2ML IJ SOLN: 25.0000 ug | INTRAMUSCULAR | Status: DC | PRN

## 2021-12-25 MED ORDER — MIDAZOLAM HCL 2 MG/2ML IJ SOLN
INTRAMUSCULAR | Status: DC | PRN
  Administered 2021-12-25: 2 mg via INTRAVENOUS

## 2021-12-25 MED ORDER — CEFAZOLIN SODIUM-DEXTROSE 1-4 GM/50ML-% IV SOLN
1.0000 g | INTRAVENOUS | Status: AC
  Administered 2021-12-25: 1 g via INTRAVENOUS

## 2021-12-25 MED ORDER — CHLORHEXIDINE GLUCONATE 0.12 % MT SOLN
OROMUCOSAL | Status: AC
  Filled 2021-12-25: qty 15

## 2021-12-25 MED ORDER — ORAL CARE MOUTH RINSE: 15.0000 mL | Freq: Once | OROMUCOSAL | Status: AC

## 2021-12-25 MED ORDER — CHLORHEXIDINE GLUCONATE 0.12 % MT SOLN
15.0000 mL | Freq: Once | OROMUCOSAL | Status: AC
  Administered 2021-12-25: 15 mL via OROMUCOSAL

## 2021-12-25 MED ORDER — MIDAZOLAM HCL 2 MG/2ML IJ SOLN
INTRAMUSCULAR | Status: AC
  Filled 2021-12-25: qty 2

## 2021-12-25 MED ORDER — PROPOFOL 10 MG/ML IV BOLUS
INTRAVENOUS | Status: DC | PRN
  Administered 2021-12-25: 50 mg via INTRAVENOUS

## 2021-12-25 MED ORDER — GLYCOPYRROLATE 0.2 MG/ML IJ SOLN
INTRAMUSCULAR | Status: DC | PRN
  Administered 2021-12-25: .2 mg via INTRAVENOUS

## 2021-12-25 MED ORDER — FENTANYL CITRATE (PF) 100 MCG/2ML IJ SOLN
INTRAMUSCULAR | Status: DC | PRN
  Administered 2021-12-25: 50 ug via INTRAVENOUS

## 2021-12-25 MED ORDER — CEFAZOLIN SODIUM-DEXTROSE 1-4 GM/50ML-% IV SOLN
INTRAVENOUS | Status: AC
  Filled 2021-12-25: qty 50

## 2021-12-25 MED ORDER — LIDOCAINE HCL 2 % EX GEL
CUTANEOUS | Status: DC | PRN
  Administered 2021-12-25: 1

## 2021-12-25 SURGICAL SUPPLY — 20 items
BAG DRAIN SIEMENS DORNER NS (MISCELLANEOUS) ×1 IMPLANT
BAG DRN NS LF (MISCELLANEOUS) ×1
BRUSH SCRUB EZ  4% CHG (MISCELLANEOUS) ×1
BRUSH SCRUB EZ 4% CHG (MISCELLANEOUS) ×1 IMPLANT
GAUZE 4X4 16PLY ~~LOC~~+RFID DBL (SPONGE) ×2 IMPLANT
GLOVE SURG UNDER POLY LF SZ7.5 (GLOVE) ×1 IMPLANT
GOWN STRL REUS W/ TWL LRG LVL3 (GOWN DISPOSABLE) ×1 IMPLANT
GOWN STRL REUS W/ TWL XL LVL3 (GOWN DISPOSABLE) ×1 IMPLANT
GOWN STRL REUS W/TWL LRG LVL3 (GOWN DISPOSABLE) ×1
GOWN STRL REUS W/TWL XL LVL3 (GOWN DISPOSABLE) ×1
KIT TURNOVER CYSTO (KITS) ×1 IMPLANT
MANIFOLD NEPTUNE II (INSTRUMENTS) ×1 IMPLANT
PACK CYSTO AR (MISCELLANEOUS) ×1 IMPLANT
SET CYSTO W/LG BORE CLAMP LF (SET/KITS/TRAYS/PACK) ×1 IMPLANT
SURGILUBE 2OZ TUBE FLIPTOP (MISCELLANEOUS) IMPLANT
SYSTEM UROLIFT 2 CART W/ HNDL (Male Continence) ×1 IMPLANT
SYSTEM UROLIFT 2 CARTRIDGE (Male Continence) IMPLANT
TRAP FLUID SMOKE EVACUATOR (MISCELLANEOUS) ×1 IMPLANT
WATER STERILE IRR 3000ML UROMA (IV SOLUTION) ×1 IMPLANT
WATER STERILE IRR 500ML POUR (IV SOLUTION) ×1 IMPLANT

## 2021-12-25 NOTE — Anesthesia Preprocedure Evaluation (Signed)
Anesthesia Evaluation  Patient identified by MRN, date of birth, ID band Patient awake    Reviewed: Allergy & Precautions, H&P , NPO status , Patient's Chart, lab work & pertinent test results, reviewed documented beta blocker date and time   History of Anesthesia Complications Negative for: history of anesthetic complications  Airway Mallampati: II  TM Distance: >3 FB Neck ROM: full    Dental no notable dental hx. (+) Dental Advidsory Given, Poor Dentition   Pulmonary neg shortness of breath, asthma , sleep apnea (BIPAP with oxygen) and Continuous Positive Airway Pressure Ventilation , neg recent URI, former smoker,    Pulmonary exam normal breath sounds clear to auscultation       Cardiovascular Exercise Tolerance: Good hypertension, (-) angina+ CAD, + Past MI, + CABG, + Peripheral Vascular Disease and +CHF  + dysrhythmias Atrial Fibrillation  Rhythm:regular Rate:Normal     Neuro/Psych PSYCHIATRIC DISORDERS Depression negative neurological ROS  negative psych ROS   GI/Hepatic negative GI ROS, Neg liver ROS, GERD  ,  Endo/Other  negative endocrine ROSdiabetes  Renal/GU Renal diseasenegative Renal ROSS/p renal transplant  negative genitourinary   Musculoskeletal   Abdominal   Peds  Hematology negative hematology ROS (+)   Anesthesia Other Findings   Reproductive/Obstetrics negative OB ROS                             Anesthesia Physical  Anesthesia Plan  ASA: 3  Anesthesia Plan: General   Post-op Pain Management: Minimal or no pain anticipated   Induction: Intravenous  PONV Risk Score and Plan: 3 and Propofol infusion, TIVA and Ondansetron  Airway Management Planned: Nasal Cannula  Additional Equipment: None  Intra-op Plan:   Post-operative Plan:   Informed Consent: I have reviewed the patients History and Physical, chart, labs and discussed the procedure including the  risks, benefits and alternatives for the proposed anesthesia with the patient or authorized representative who has indicated his/her understanding and acceptance.     Dental advisory given  Plan Discussed with: CRNA and Surgeon  Anesthesia Plan Comments: (Discussed risks of anesthesia with patient, including possibility of difficulty with spontaneous ventilation under anesthesia necessitating airway intervention, PONV, and rare risks such as cardiac or respiratory or neurological events, and allergic reactions. Discussed the role of CRNA in patient's perioperative care. Patient understands.)        Anesthesia Quick Evaluation

## 2021-12-25 NOTE — Telephone Encounter (Signed)
Its intermittent when he has episodes.

## 2021-12-25 NOTE — Interval H&P Note (Signed)
History and Physical Interval Note:  12/25/2021 9:43 AM  [name].  has presented today for surgery, with the diagnosis of Benign Prostatic Hyperplasia with Urinary Obstruction.  The various methods of treatment have been discussed with the patient and family. After consideration of risks, benefits and other options for treatment, the patient has consented to  Procedure(s): CYSTOSCOPY WITH INSERTION OF UROLIFT (N/A) as a surgical intervention.  The patient's history has been reviewed, patient examined, no change in status, stable for surgery.  I have reviewed the patient's chart and labs.  Questions were answered to the patient's satisfaction.     [place]

## 2021-12-25 NOTE — Telephone Encounter (Signed)
We received FMLA forms on [name], for his wife, [name] employer.  The forms have been placed in your inbox for completion and signing.  When forms have been completed and faxed, [name] would like a copy mailed to her home address.

## 2021-12-25 NOTE — Anesthesia Postprocedure Evaluation (Signed)
Anesthesia Post Note  Patient: [name].  Procedure(s) Performed: CYSTOSCOPY WITH INSERTION OF UROLIFT (Prostate)  Patient location during evaluation: PACU Anesthesia Type: General Level of consciousness: awake and alert Pain management: pain level controlled Vital Signs Assessment: post-procedure vital signs reviewed and stable Respiratory status: spontaneous breathing, nonlabored ventilation, respiratory function stable and patient connected to nasal cannula oxygen Cardiovascular status: blood pressure returned to baseline and stable Postop Assessment: no apparent nausea or vomiting Anesthetic complications: no   No notable events documented.   Last Vitals:  Vitals:   12/25/21 1115 12/25/21 1153  BP: 119/66 133/86  Pulse: 69 64  Resp: 16 16  Temp: (!) 36.1 C   SpO2: 96% 99%    Last Pain:  Vitals:   12/25/21 1153  TempSrc:   PainSc: [place]

## 2021-12-25 NOTE — H&P (Signed)
Urology H&P   History of Present Illness: [name]. is a 61 y.o. male with severe lower urinary tract symptoms.  Cystoscopy showed moderate lateral lobe enlargement and a 53 g prostate on transrectal ultrasound.  IPSS 26/35 and PVR was 86 cc.  Most bothersome symptoms are urinary hesitancy and a weak urinary stream.  He has had persistent symptoms with an alpha-blocker.  After discussing options he has elected UroLift  Past Medical History:  Diagnosis Date   Anemia    Aortic atherosclerosis ([place])    Asthma, persistent controlled 02/25/2013   Attention or concentration deficit 04/26/2021   BPH with obstruction/lower urinary tract symptoms 09/25/2014   CAD (coronary artery disease) 2005   a.) LHC 1996 -> HG stenosis mLAD -> PTCA/PCI with stent x 1 (unk type) -> complicated by CFA pseudoanurysm (required surg);  b.) LHC 08/10/2002  -> 95% LAD, 70% ISR LAD, 80% pOM, 70% mRCA --> CVTS consult; c.) 4v CABG 08/13/2002; c.) 03/2018 MV: Small, fixed inferoapical and apical sep defect. No isc. EF 47% (60-65% by 05/2018 TTE); d.) 02/2021 MV: small Apical inf fixed defect. No isc -> low risk.   Cellulitis and abscess of left leg 07/22/2020   Chronic atrial fibrillation    a.) CHA2DS2VASc = 4 (CHF, HTN, vascular disease history, T2DM);  b.) rate/rhythm maintained without pharmacological intervention; chronically anticoagulated with apixaban   Chronic combined systolic and diastolic CHF (congestive heart failure)    a.  7/18 Echo: EF 45-50%; b. 05/2018 Echo: EF 60-65%, RVSP 74.8; c. 12/2018 Echo: EF 50-55%. Sev dil LA; d. 04/2019 Echo: EF 45-50%; e. 07/2021 Echo: EF 40-45%, GrIII DD; f. 10/2021 Echo: EF 40-45%, mild conc LVH, septal-lat dyssynchrony (LBBB), mildly reduced RV fxn, mild-mod dil LA, mildly dil RA, mild-mod MS, mod AS.   Chronic venous stasis dermatitis of both lower extremities    Cytomegaloviral disease 2017   Diverticulosis of colon 04/22/2013   ESRD (end stage renal disease)  ([place])    a.) dialysis dependent 2004-2012; b.) s/p cadaveric RIGHT renal transplant 09/13/2010   Essential hypertension 12/17/2010   GERD (gastroesophageal reflux disease)    Gout    History of 2019 novel coronavirus disease (COVID-19) 06/30/2019   History of renal transplant 09/13/2010   a.) s/p cadaveric donor transplant 09/13/2010   Hx of bilateral cataract extraction 10/2017   Hyperlipidemia    Hypogonadism in male 09/25/2014   Ischemic cardiomyopathy    Left cephalic vein thrombosis 31/5400   Long term current use of anticoagulant    a.) apixaban   Long term current use of immunosuppressive drug    a.) mycophenolate + prednisone + tacrolimus   Major depressive disorder 10/04/2013   Mitral stenosis 11/07/2021   a. TTE 11/07/2021: severe MAC, mild-mod MS (MPG 6 mmHg)   Moderate aortic stenosis    a. 10/2021 Echo: Mod AS. AVA 1.06cm^2 (VTI). Mean grad 11mHg.   Obesity hypoventilation syndrome 03/31/2012   OSA treated with BiPAP 09/29/2012   PAH (pulmonary artery hypertension)    a. 04/2019 RHC: RA 19, RV 80/20, PA 78/31 (51), PCWP 25, CO/CI 7.69/3.26. PVR 3.25 WU-->Sev PAH, likely primarily PV HTN.   S/P CABG x 4 08/13/2002   a.) LIMA-LAD, LRA-OM, SVG-D1, SVG-dRCA   S/P gastric bypass    Synovitis of finger 06/11/2019   Trigger finger, unspecified little finger 12/23/2018   Type 2 diabetes mellitus with hyperglycemia, with long-term current use of insulin 09/09/2014   Ulcer 05/2016   Left shin  Wears glasses     Past Surgical History:  Procedure Laterality Date   CARDIAC CATHETERIZATION N/A 08/10/2002   CATARACT EXTRACTION W/PHACO Right 10/29/2017   Procedure: CATARACT EXTRACTION PHACO AND INTRAOCULAR LENS PLACEMENT ([place])  RIGHT DIABETIC;  Surgeon: [name], MD;  Location: [facility];  Service: Ophthalmology;  Laterality: Right;  Diabetic - insulin   CATARACT EXTRACTION W/PHACO Left 11/18/2017   Procedure: CATARACT EXTRACTION PHACO AND INTRAOCULAR  LENS PLACEMENT ([place]) LEFT IVA/TOPICAL;  Surgeon: [name], MD;  Location: [place];  Service: Ophthalmology;  Laterality: Left;  DIABETES - insulin sleep apnea   COLONOSCOPY WITH PROPOFOL N/A 04/22/2013   Procedure: COLONOSCOPY WITH PROPOFOL;  Surgeon: [name], MD;  Location: [facility];  Service: Endoscopy;  Laterality: N/A;   CORONARY ANGIOPLASTY WITH STENT PLACEMENT N/A 1996   CORONARY ARTERY BYPASS GRAFT N/A 08/13/2002   Procedure: 4v CORONARY ARTERY BYPASS GRAFTING; Location: [name]; Surgeon: [name], MD   DG ANGIO AV SHUNT*L*     right and left upper arms   FASCIOTOMY  03/03/2012   Procedure: FASCIOTOMY;  Surgeon: [name], MD;  Location: [facility];  Service: Orthopedics;  Laterality: Right;  FASCIOTOMY RIGHT SMALL FINGER   FASCIOTOMY Left 08/17/2013   Procedure: FASCIOTOMY LEFT RING;  Surgeon: [name], MD;  Location: [facility];  Service: Orthopedics;  Laterality: Left;   INCISION AND DRAINAGE ABSCESS Left 10/15/2015   Procedure: INCISION AND DRAINAGE ABSCESS;  Surgeon: [name], MD;  Location: [facility];  Service: General;  Laterality: Left;   KIDNEY TRANSPLANT  09/13/2010   cadaver--at [facility] HEART CATH N/A 11/15/2016   Procedure: RIGHT HEART CATH;  Surgeon: [name], MD;  Location: [place] [facility];  Service: Cardiovascular;  Laterality: N/A;   RIGHT HEART CATH N/A 05/03/2019   Procedure: RIGHT HEART CATH;  Surgeon: [name], MD;  Location: [place];  Service: Cardiovascular;  Laterality: N/A;   ROUX-EN-Y GASTRIC BYPASS N/A 2015   TYMPANIC MEMBRANE REPAIR Left 03/2010   VASECTOMY      Home Medications:  Current Meds  Medication Sig   albuterol (PROVENTIL) (2.5 MG/3ML) 0.083% nebulizer solution Take 3 mLs (2.5 mg total) by nebulization every 6 (six) hours as needed for wheezing or shortness of breath.   albuterol (VENTOLIN HFA) 108 (90 Base) MCG/ACT  inhaler Inhale 2 puffs into the lungs every 6 (six) hours as needed for wheezing or shortness of breath.   allopurinol (ZYLOPRIM) 100 MG tablet Take 100 mg by mouth daily.   buPROPion (WELLBUTRIN SR) 150 MG 12 hr tablet TAKE 1 TABLET BY MOUTH TWICE DAILY   cholecalciferol (VITAMIN D3) 25 MCG (1000 UNIT) tablet Take 1,000 Units by mouth at bedtime.   colchicine 0.6 MG tablet Take 1 tablet (0.6 mg total) by mouth every other day   dapagliflozin propanediol (FARXIGA) 10 MG TABS tablet Take 1 tablet (10 mg total) by mouth daily before breakfast.   gabapentin (NEURONTIN) 300 MG capsule TAKE 1 CAPSULE BY MOUTH TWICE DAILY   hydrALAZINE (APRESOLINE) 25 MG tablet Take 1/2 tablet (12.5 mg total) by mouth every 8 (eight) hours.   hydrocortisone 2.5 % cream Apply topically 3 (three) times daily as needed.   insulin glargine-yfgn (SEMGLEE) 100 UNIT/ML Pen Inject 40 Units subcutaneously once daily   insulin lispro (HUMALOG KWIKPEN) 100 UNIT/ML KwikPen INJECT UP TO 50 UNITS UNDER THE SKIN DAILY AS DIRECTED   iron polysaccharides (  FERREX 150) 150 MG capsule TAKE 1 CAPSULE BY MOUTH 2 TIMES DAILY.   metolazone (ZAROXOLYN) 2.5 MG tablet Take 1 tablet by mouth 3 times weekly alternating with 2 times weekly.   montelukast (SINGULAIR) 10 MG tablet TAKE 1 TABLET BY MOUTH AT BEDTIME.   Multiple Vitamin (MULTIVITAMIN WITH MINERALS) TABS tablet Take 1 tablet by mouth daily.   mycophenolate (MYFORTIC) 180 MG EC tablet Take 360 mg by mouth 2 (two) times daily.   omeprazole (PRILOSEC) 20 MG capsule TAKE 1 CAPSULE BY MOUTH DAILY.   oxyCODONE (OXY IR/ROXICODONE) 5 MG immediate release tablet Take 1 tablet (5 mg total) by mouth every 4 (four) hours as needed.   Potassium Chloride CR (MICRO-K) 8 MEQ CPCR capsule CR Take 13 capsules (104 mEq total) by mouth 2 (two) times daily.   predniSONE (DELTASONE) 10 MG tablet Take 4 tablets (40 mg total) by mouth once daily for 2 days, THEN 3 tablets (30 mg total) once daily for 2 days,  THEN 2 tablets (20 mg total) once daily for 2 days, THEN 1 tablet (10 mg total) once daily for 2 days.   predniSONE (DELTASONE) 5 MG tablet Take 1 tablet (5 mg total) by mouth daily.   rosuvastatin (CRESTOR) 10 MG tablet Take 1 tablet (10 mg total) by mouth daily.   tacrolimus (PROGRAF) 1 MG capsule Take 2 capsules (2 mg total) by mouth 2 times daily.   tadalafil, PAH, (ADCIRCA) 20 MG tablet Take 1 tablet (20 mg total) by mouth daily.   tamsulosin (FLOMAX) 0.4 MG CAPS capsule TAKE 1 CAPSULE BY MOUTH DAILY.   torsemide (DEMADEX) 100 MG tablet Take 1 tablet (100 mg total) by mouth 2 (two) times daily.   zinc sulfate 220 (50 Zn) MG capsule Take 220 mg by mouth daily.    Allergies:  Allergies  Allergen Reactions   Contrast Media [Iodinated Contrast Media] Other (See Comments)    Kidney transplant   Iodine Other (See Comments)    Other reaction(s): Other (See Comments) Kidney transplant   Ibuprofen Other (See Comments)    Due to kidney transplant Due to kidney transplant Due to kidney transplant    Family History  Problem Relation Age of Onset   Heart disease Father    Kidney failure Father    Kidney disease Father    Diabetes Maternal Grandmother    Breast cancer Maternal Grandmother    Valvular heart disease Mother    Liver cancer Paternal Uncle    Liver cancer Paternal Grandmother    Prostate cancer Neg Hx     Social History:  reports that he quit smoking about 27 years ago. His smoking use included cigars. He has never been exposed to tobacco smoke. He has never used smokeless tobacco. He reports current alcohol use. He reports that he does not use drugs.  ROS: Otherwise noncontributory septa as per the HPI  Physical Exam:  Vital signs in last 24 hours: Temp:  [96.8 F (36 C)] 96.8 F (36 C) (10/03 0800) Pulse Rate:  [58] 58 (10/03 0800) BP: (140)/(73) 140/73 (10/03 0800) SpO2:  [98 %] 98 % (10/03 0800) Weight:  [114.8 kg] 114.8 kg (10/03 0800) Constitutional:  Alert  and oriented, No acute distress HEENT: [place] AT, moist mucus membranes.  Trachea midline, no masses Cardiovascular: Regular rate and rhythm, no clubbing, cyanosis, or edema. Respiratory: Normal respiratory effort, lungs clear bilaterally GI: Abdomen is soft, nontender, nondistended, no abdominal masses GU: No CVA tenderness Skin: No rashes, bruises or  suspicious lesions Lymph: No cervical or inguinal adenopathy Neurologic: Grossly intact, no focal deficits, moving all 4 extremities Psychiatric: Normal mood and affect   Laboratory Data:  Recent Labs    12/25/21 0853  HGB 11.2*  HCT 33.0*   Recent Labs    12/25/21 0853  NA 140  K 3.0*  CL 95*  GLUCOSE 117*  BUN 69*  CREATININE 2.60*   No results for input(s): "LABPT", "INR" in the last 72 hours. No results for input(s): "LABURIN" in the last 72 hours. Results for orders placed or performed in visit on 12/14/21  CULTURE, URINE COMPREHENSIVE     Status: None   Collection Time: 12/14/21  9:02 AM   Specimen: Urine   UR  Result Value Ref Range Status   Urine Culture, Comprehensive Final report  Final   Organism ID, Bacteria Comment  Final    Comment: Mixed urogenital flora 100 Colonies/mL                                                      .   Microscopic Examination     Status: Abnormal   Collection Time: 12/14/21  9:02 AM   Urine  Result Value Ref Range Status   WBC, UA 0-5 0 - 5 /hpf Final   RBC, Urine 0-2 0 - 2 /hpf Final   Epithelial Cells (non renal) 0-10 0 - 10 /hpf Final   Casts Present (A) None seen /lpf Final   Cast Type Hyaline casts N/A Final   Bacteria, UA None seen None seen/Few Final   *Note: Due to a large number of results and/or encounters for the requested time period, some results have not been displayed. A complete set of results can be found in Results Review.     Impression/Assessment:  BPH with severe LUTS  Plan:  Insertion of UroLift The procedure has been discussed in detail.  The most  common side effects of frequency, urgency dysuria and hematuria for 7-14 days was discussed It is also been discussed there is no guarantee this will improve his symptoms however he would be a candidate for other treatment options if the UroLift was not successful.  All questions were answered and he desires to proceed   12/25/2021, 9:39 AM  John Giovanni,  MD

## 2021-12-25 NOTE — Discharge Instructions (Addendum)
Urolift Post-Operative Instructions     Patient Expectations   1. Mild blood in your urine for about 1 week.  2. Urinary buring, frequency, and urgency for 10-14 days.  3. Mild pelvic pain 1-2 weeks.     Return to Activity     1. Drink water post procedure.  2. Take meds as needed.  Tylenol and/or Motrin is most helpful.  You may also by Pyridium/Azo over-the-counter for urinary burning.  Continue taking any prostate medications until your first postoperative visit.  May resume Eliquis 12/26/2021  3. No lifting or straining 48hrs.  4. Other activity when you feel up to it.  5.  Call our office [phone]) [phone] for any questions or concerns. AMBULATORY SURGERY  DISCHARGE INSTRUCTIONS   The drugs that you were given will stay in your system until tomorrow so for the next 24 hours you should not:  Drive an automobile Make any legal decisions Drink any alcoholic beverage   You may resume regular meals tomorrow.  Today it is better to start with liquids and gradually work up to solid foods.  You may eat anything you prefer, but it is better to start with liquids, then soup and crackers, and gradually work up to solid foods.   Please notify your doctor immediately if you have any unusual bleeding, trouble breathing, redness and pain at the surgery site, drainage, fever, or pain not relieved by medication.    Additional Instructions:    Please contact your physician with any problems or Same Day Surgery at [phone], Monday through Friday 6 am to 4 pm, or [place] at [facility] number at [phone].

## 2021-12-25 NOTE — Telephone Encounter (Signed)
We received an FMLA form for patient for his wife, Maximillion Gill.  I tried calling Mateo Flow to see if this is intermittent or continuous leave.  Her phone did not go to vmail, it just rang multiple times, then said call can not be completed.

## 2021-12-25 NOTE — Transfer of Care (Signed)
Immediate Anesthesia Transfer of Care Note  Patient: [name].  Procedure(s) Performed: CYSTOSCOPY WITH INSERTION OF UROLIFT (Prostate)  Patient Location: PACU  Anesthesia Type:General  Level of Consciousness: drowsy  Airway & Oxygen Therapy: Patient Spontanous Breathing and Patient connected to nasal cannula oxygen  Post-op Assessment: Report given to RN and Post -op Vital signs reviewed and stable  Post vital signs: Reviewed and stable  Last Vitals:  Vitals Value Taken Time  BP 97/54 12/25/21 1031  Temp    Pulse 85 12/25/21 1035  Resp 19 12/25/21 1035  SpO2 95 % 12/25/21 1035  Vitals shown include unvalidated device data.  Last Pain:  Vitals:   12/25/21 0800  TempSrc: Oral  PainSc: 0-No pain         Complications: No notable events documented.

## 2021-12-26 ENCOUNTER — Encounter: Payer: Self-pay | Admitting: Urology

## 2021-12-26 NOTE — Op Note (Signed)
Preoperative diagnosis: BPH with LUTS  Postoperative diagnosis: BPH with LUTS  Procedure: Urolift (placement of 6 implants)  Surgeon: Bernardo Heater  Anesthesia: MAC  Complications: None  Drains: None  Estimated blood loss: < 5 mL  Indications: [name]. is a 61 y.o. male with severe lower urinary tract symptoms.  Cystoscopy showed moderate lateral lobe enlargement and a 53 g prostate on transrectal ultrasound.  IPSS 26/35 and PVR was 86 cc.  Most bothersome symptoms are urinary hesitancy and a weak urinary stream.  He has had persistent symptoms with an alpha-blocker.  After discussing options he has elected UroLift.  He has been instructed to the procedure as well as risks and complications which include but are not limited to infection, bleeding, and inadequate treatment with the Urolift procedure alone, anesthetic complications, among others.  He understands these and desires to proceed.  Findings: Using the 17 French cystoscope, urethra and bladder were inspected.  There were no urethral lesions.  Moderate lateral lobe enlargement was noted.  Small, nonobstructing median lobe and no bladder neck elevation.  The bladder was inspected circumferentially.  This revealed normal findings.  Description of procedure: The patient was properly identified in the holding area.  He received preoperative IV antibiotics.  He was taken to the operating room where general anesthetic was administered with the LMA.  He was placed in the dorsolithotomy position.  Genitalia and perineum were prepped and draped.  Proper timeout was performed.  A lidocaine Uro-Jet was instilled per urethra.  A 55F cystoscope was inserted into the bladder. The cystoscopy bridge was replaced with a UroLift II delivery device.  The 1st pair of implants were placed 2 cm from the bladder neck and the anterolateral prostatic urethra.  The 2nd pair of implants were placed just proximal to the verumontanum. The keyhole insert was  inserted into the delivery device and cystoscopy was performed.  The anterior channel was incomplete with the cystoscope positioned at the veru and an additional pair of implants was placed between the prior proximal and distal implants.  A final cystoscopy was conducted first to inspect the location and state of each implant and second, to confirm the presence of a continuous anterior channel was present through the prostatic urethra.  No injury to the bladder or urethra was detected.  No significant bleeding was noted and a Foley catheter was not placed.Six implants were delivered in total.   He tolerated procedure well and was transported to the PACU in stable condition.  Plan: Office follow-up 1 month with IPSS and PVR   Terrianna Holsclaw. MD

## 2021-12-26 NOTE — Telephone Encounter (Signed)
Completed forms have been faxed to [phone].  Received fax confirmation.  Copies have been made for billing, scanning, patient, and myself.  Patient's copy has been placed in outgoing mail to home address on system.

## 2021-12-28 ENCOUNTER — Telehealth: Payer: Self-pay | Admitting: *Deleted

## 2021-12-28 ENCOUNTER — Ambulatory Visit (INDEPENDENT_AMBULATORY_CARE_PROVIDER_SITE_OTHER): Payer: 59 | Admitting: Urology

## 2021-12-28 ENCOUNTER — Other Ambulatory Visit: Payer: Self-pay

## 2021-12-28 ENCOUNTER — Encounter: Payer: Self-pay | Admitting: Urology

## 2021-12-28 VITALS — BP 140/72 | HR 78 | Temp 97.8°F | Ht 69.0 in | Wt 253.0 lb

## 2021-12-28 DIAGNOSIS — N401 Enlarged prostate with lower urinary tract symptoms: Secondary | ICD-10-CM | POA: Diagnosis not present

## 2021-12-28 DIAGNOSIS — N138 Other obstructive and reflux uropathy: Secondary | ICD-10-CM | POA: Diagnosis not present

## 2021-12-28 DIAGNOSIS — R310 Gross hematuria: Secondary | ICD-10-CM | POA: Diagnosis not present

## 2021-12-28 DIAGNOSIS — R300 Dysuria: Secondary | ICD-10-CM

## 2021-12-28 DIAGNOSIS — E1165 Type 2 diabetes mellitus with hyperglycemia: Secondary | ICD-10-CM | POA: Diagnosis not present

## 2021-12-28 LAB — URINALYSIS, COMPLETE

## 2021-12-28 LAB — BLADDER SCAN AMB NON-IMAGING: Scan Result: 36

## 2021-12-28 LAB — MICROSCOPIC EXAMINATION: RBC, Urine: >30 /hpf — AB (ref 0–2)

## 2021-12-28 MED ORDER — CEFUROXIME AXETIL 250 MG PO TABS
250.0000 mg | ORAL_TABLET | Freq: Two times a day (BID) | ORAL | 0 refills | Status: DC
  Filled 2021-12-28: qty 14, 7d supply, fill #0

## 2021-12-28 NOTE — Telephone Encounter (Signed)
Pt calling stating that he woke up to a " puddle of blood" bleeding has stopped, only seeing blood when he urinates, he feels warm and having shivers, appt scheduled at 11:00

## 2021-12-28 NOTE — Progress Notes (Signed)
12/28/2021 12:18 PM   [name] 01-Aug-1960 [id number]  Referring provider: [name], MD [place],  [place] [address]  Chief Complaint  Patient presents with   Other    HPI: 61 y.o. male status post UroLift 12/24/2021.  Subjective fever with chills last night Temp today 99 at home Complaining of frequency, urgency, voiding small amounts and gross hematuria Restarted Eliquis POD #1   PMH: Past Medical History:  Diagnosis Date   Anemia    Aortic atherosclerosis (HCC)    Asthma, persistent controlled 02/25/2013   Attention or concentration deficit 04/26/2021   BPH with obstruction/lower urinary tract symptoms 09/25/2014   CAD (coronary artery disease) 2005   a.) LHC 1996 -> HG stenosis mLAD -> PTCA/PCI with stent x 1 (unk type) -> complicated by CFA pseudoanurysm (required surg);  b.) LHC 08/10/2002  -> 95% LAD, 70% ISR LAD, 80% pOM, 70% mRCA --> CVTS consult; c.) 4v CABG 08/13/2002; c.) 03/2018 MV: Small, fixed inferoapical and apical sep defect. No isc. EF 47% (60-65% by 05/2018 TTE); d.) 02/2021 MV: small Apical inf fixed defect. No isc -> low risk.   Cellulitis and abscess of left leg 07/22/2020   Chronic atrial fibrillation    a.) CHA2DS2VASc = 4 (CHF, HTN, vascular disease history, T2DM);  b.) rate/rhythm maintained without pharmacological intervention; chronically anticoagulated with apixaban   Chronic combined systolic and diastolic CHF (congestive heart failure)    a.  7/18 Echo: EF 45-50%; b. 05/2018 Echo: EF 60-65%, RVSP 74.8; c. 12/2018 Echo: EF 50-55%. Sev dil LA; d. 04/2019 Echo: EF 45-50%; e. 07/2021 Echo: EF 40-45%, GrIII DD; f. 10/2021 Echo: EF 40-45%, mild conc LVH, septal-lat dyssynchrony (LBBB), mildly reduced RV fxn, mild-mod dil LA, mildly dil RA, mild-mod MS, mod AS.   Chronic venous stasis dermatitis of both lower extremities    Cytomegaloviral disease 2017   Diverticulosis of colon 04/22/2013   ESRD (end stage renal  disease) ([place])    a.) dialysis dependent 2004-2012; b.) s/p cadaveric RIGHT renal transplant 09/13/2010   Essential hypertension 12/17/2010   GERD (gastroesophageal reflux disease)    Gout    History of 2019 novel coronavirus disease (COVID-19) 06/30/2019   History of renal transplant 09/13/2010   a.) s/p cadaveric donor transplant 09/13/2010   Hx of bilateral cataract extraction 10/2017   Hyperlipidemia    Hypogonadism in male 09/25/2014   Ischemic cardiomyopathy    Left cephalic vein thrombosis 15/4008   Long term current use of anticoagulant    a.) apixaban   Long term current use of immunosuppressive drug    a.) mycophenolate + prednisone + tacrolimus   Major depressive disorder 10/04/2013   Mitral stenosis 11/07/2021   a. TTE 11/07/2021: severe MAC, mild-mod MS (MPG 6 mmHg)   Moderate aortic stenosis    a. 10/2021 Echo: Mod AS. AVA 1.06cm^2 (VTI). Mean grad 79mHg.   Obesity hypoventilation syndrome 03/31/2012   OSA treated with BiPAP 09/29/2012   PAH (pulmonary artery hypertension)    a. 04/2019 RHC: RA 19, RV 80/20, PA 78/31 (51), PCWP 25, CO/CI 7.69/3.26. PVR 3.25 WU-->Sev PAH, likely primarily PV HTN.   S/P CABG x 4 08/13/2002   a.) LIMA-LAD, LRA-OM, SVG-D1, SVG-dRCA   S/P gastric bypass    Synovitis of finger 06/11/2019   Trigger finger, unspecified little finger 12/23/2018   Type 2 diabetes mellitus with hyperglycemia, with long-term current use of insulin 09/09/2014   Ulcer 05/2016   Left shin  Wears glasses     Surgical History: Past Surgical History:  Procedure Laterality Date   CARDIAC CATHETERIZATION N/A 08/10/2002   CATARACT EXTRACTION W/PHACO Right 10/29/2017   Procedure: CATARACT EXTRACTION PHACO AND INTRAOCULAR LENS PLACEMENT ([place])  RIGHT DIABETIC;  Surgeon: [name], MD;  Location: [facility];  Service: Ophthalmology;  Laterality: Right;  Diabetic - insulin   CATARACT EXTRACTION W/PHACO Left 11/18/2017   Procedure: CATARACT  EXTRACTION PHACO AND INTRAOCULAR LENS PLACEMENT ([place]) LEFT IVA/TOPICAL;  Surgeon: [name], MD;  Location: [facility];  Service: Ophthalmology;  Laterality: Left;  DIABETES - insulin sleep apnea   COLONOSCOPY WITH PROPOFOL N/A 04/22/2013   Procedure: COLONOSCOPY WITH PROPOFOL;  Surgeon: [name], MD;  Location: [facility];  Service: Endoscopy;  Laterality: N/A;   CORONARY ANGIOPLASTY WITH STENT PLACEMENT N/A 1996   CORONARY ARTERY BYPASS GRAFT N/A 08/13/2002   Procedure: 4v CORONARY ARTERY BYPASS GRAFTING; Location: [name]; Surgeon: [name], MD   CYSTOSCOPY WITH INSERTION OF UROLIFT N/A 12/25/2021   Procedure: CYSTOSCOPY WITH INSERTION OF UROLIFT;  Surgeon: [name], MD;  Location: [facility];  Service: Urology;  Laterality: N/A;   DG ANGIO AV SHUNT*L*     right and left upper arms   FASCIOTOMY  03/03/2012   Procedure: FASCIOTOMY;  Surgeon: [name], MD;  Location: [facility];  Service: Orthopedics;  Laterality: Right;  FASCIOTOMY RIGHT SMALL FINGER   FASCIOTOMY Left 08/17/2013   Procedure: FASCIOTOMY LEFT RING;  Surgeon: [name], MD;  Location: [facility];  Service: Orthopedics;  Laterality: Left;   INCISION AND DRAINAGE ABSCESS Left 10/15/2015   Procedure: INCISION AND DRAINAGE ABSCESS;  Surgeon: [name], MD;  Location: [facility];  Service: General;  Laterality: Left;   KIDNEY TRANSPLANT  09/13/2010   cadaver--at [facility] HEART CATH N/A 11/15/2016   Procedure: RIGHT HEART CATH;  Surgeon: [name], MD;  Location: [facility];  Service: Cardiovascular;  Laterality: N/A;   RIGHT HEART CATH N/A 05/03/2019   Procedure: RIGHT HEART CATH;  Surgeon: [name], MD;  Location: [facility];  Service: Cardiovascular;  Laterality: N/A;   ROUX-EN-Y GASTRIC BYPASS N/A 2015   TYMPANIC MEMBRANE REPAIR Left 03/2010   VASECTOMY      Home Medications:  Allergies as of 12/28/2021        Reactions   Contrast Media [iodinated Contrast Media] Other (See Comments)   Kidney transplant   Iodine Other (See Comments)   Other reaction(s): Other (See Comments) Kidney transplant   Ibuprofen Other (See Comments)   Due to kidney transplant Due to kidney transplant Due to kidney transplant        Medication List        Accurate as of December 28, 2021 12:18 PM. If you have any questions, ask your nurse or doctor.          albuterol 108 (90 Base) MCG/ACT inhaler Commonly known as: VENTOLIN HFA Inhale 2 puffs into the lungs every 6 (six) hours as needed for wheezing or shortness of breath.   albuterol (2.5 MG/3ML) 0.083% nebulizer solution Commonly known as: PROVENTIL Inhale one vial (3 ml) via nebulizer every 6 hours as needed for wheezing or shortness of breath (Take 3 mLs (2.5 mg total) by nebulization every 6 (six) hours as needed for wheezing or shortness of breath.)   allopurinol 100 MG tablet Commonly known as: ZYLOPRIM Take 100 mg by  mouth daily.   beclomethasone 80 MCG/ACT inhaler Commonly known as: QVAR Inhale 2 puffs into the lungs 2 (two) times daily. What changed:  when to take this reasons to take this   buPROPion 150 MG 12 hr tablet Commonly known as: WELLBUTRIN SR TAKE 1 TABLET BY MOUTH TWICE DAILY   cholecalciferol 25 MCG (1000 UNIT) tablet Commonly known as: VITAMIN D3 Take 1,000 Units by mouth at bedtime.   colchicine 0.6 MG tablet Take 1 tablet (0.6 mg total) by mouth every other day   Eliquis 5 MG Tabs tablet Generic drug: apixaban TAKE 1 TABLET BY MOUTH TWICE DAILY   Farxiga 10 MG Tabs tablet Generic drug: dapagliflozin propanediol Take 1 tablet (10 mg total) by mouth daily before breakfast.   Ferrex 150 150 MG capsule Generic drug: iron polysaccharides TAKE 1 CAPSULE BY MOUTH 2 TIMES DAILY.   fluticasone 50 MCG/ACT nasal spray Commonly known as: FLONASE PLACE 2 SPRAYS INTO BOTH NOSTRILS DAILY.   FreeStyle Libre 3  Sensor Misc Use one every 2 weeks.   gabapentin 300 MG capsule Commonly known as: NEURONTIN TAKE 1 CAPSULE BY MOUTH TWICE DAILY   HumaLOG KwikPen 100 UNIT/ML KwikPen Generic drug: insulin lispro INJECT UP TO 50 UNITS UNDER THE SKIN DAILY AS DIRECTED (INJECT UP TO 50 UNITS UNDER THE SKIN DAILY AS DIRECTED)   hydrALAZINE 25 MG tablet Commonly known as: APRESOLINE Take 1/2 tablet (12.5 mg total) by mouth every 8 (eight) hours.   hydrocortisone 2.5 % cream Apply topically 3 (three) times daily as needed.   lidocaine 5 % Commonly known as: LIDODERM Place 1 patch onto the skin daily. Remove & Discard patch within 12 hours or as directed by MD What changed: additional instructions   metolazone 2.5 MG tablet Commonly known as: ZAROXOLYN Take 1 tablet by mouth 3 times weekly alternating with 2 times weekly.   montelukast 10 MG tablet Commonly known as: SINGULAIR TAKE 1 TABLET BY MOUTH AT BEDTIME.   multivitamin with minerals Tabs tablet Take 1 tablet by mouth daily.   mycophenolate 180 MG EC tablet Commonly known as: MYFORTIC Take 360 mg by mouth 2 (two) times daily.   omeprazole 20 MG capsule Commonly known as: PRILOSEC TAKE 1 CAPSULE BY MOUTH DAILY.   oxyCODONE 5 MG immediate release tablet Commonly known as: Oxy IR/ROXICODONE Take 1 tablet (5 mg total) by mouth every 4 (four) hours as needed.   Potassium Chloride CR 8 MEQ Cpcr capsule CR Commonly known as: MICRO-K Take 13 capsules (104 mEq total) by mouth 2 (two) times daily.   predniSONE 5 MG tablet Commonly known as: DELTASONE Take 1 tablet (5 mg total) by mouth daily.   predniSONE 10 MG tablet Commonly known as: DELTASONE Take 4 tablets by mouth daily for 2 days, 3 tablets for 2 days, 2 tablets for 2 days, then 1 tablet for 2 days (Take 4 tablets (40 mg total) by mouth once daily for 2 days, THEN 3 tablets (30 mg total) once daily for 2 days, THEN 2 tablets (20 mg total) once daily for 2 days, THEN 1 tablet (10  mg total) once daily for 2 days.)   rosuvastatin 10 MG tablet Commonly known as: CRESTOR Take 1 tablet (10 mg total) by mouth daily.   Semglee (yfgn) 100 UNIT/ML Pen Generic drug: insulin glargine-yfgn Inject 40 Units subcutaneously once daily   sulfamethoxazole-trimethoprim 400-80 MG tablet Commonly known as: BACTRIM TAKE 1 TABLET BY MOUTH EVERY MONDAY, WEDNESDAY, FRIDAY.   tacrolimus 1 MG  capsule Commonly known as: PROGRAF Take 2 capsules (2 mg total) by mouth 2 times daily.   tadalafil (PAH) 20 MG tablet Commonly known as: ADCIRCA Take 1 tablet (20 mg total) by mouth daily.   tamsulosin 0.4 MG Caps capsule Commonly known as: FLOMAX TAKE 1 CAPSULE BY MOUTH DAILY.   torsemide 100 MG tablet Commonly known as: DEMADEX Take 1 tablet (100 mg total) by mouth 2 (two) times daily.   Unifine Pentips 31G X 5 MM Misc Generic drug: Insulin Pen Needle Use 4 times daily as directed   zinc sulfate 220 (50 Zn) MG capsule Take 220 mg by mouth daily.        Allergies:  Allergies  Allergen Reactions   Contrast Media [Iodinated Contrast Media] Other (See Comments)    Kidney transplant   Iodine Other (See Comments)    Other reaction(s): Other (See Comments) Kidney transplant   Ibuprofen Other (See Comments)    Due to kidney transplant Due to kidney transplant Due to kidney transplant    Family History: Family History  Problem Relation Age of Onset   Heart disease Father    Kidney failure Father    Kidney disease Father    Diabetes Maternal Grandmother    Breast cancer Maternal Grandmother    Valvular heart disease Mother    Liver cancer Paternal Uncle    Liver cancer Paternal Grandmother    Prostate cancer Neg Hx     Social History:  reports that he quit smoking about 27 years ago. His smoking use included cigars. He has never been exposed to tobacco smoke. He has never used smokeless tobacco. He reports current alcohol use. He reports that he does not use  drugs.   Physical Exam: BP (!) 140/72   Pulse 78   Temp 97.8 F (36.6 C) (Oral)   Ht 5' 9" (1.753 m)   Wt 253 lb (114.8 kg)   BMI 37.36 kg/m   Constitutional:  Alert and oriented, No acute distress. HEENT: [place] AT Respiratory: Normal respiratory effort, no increased work of breathing. Psychiatric: Normal mood and affect.  Laboratory Data:  Urinalysis Dipstick red, cloudy Microscopy >30 RBC; 0-5 WBC    Assessment & Plan:    1. Benign prostatic hyperplasia with urinary obstruction 3 days status post UroLift We discussed frequency, urgency dysuria are common postoperative symptoms PVR today 37 mL Hold Eliquis x2 days Urine culture ordered Empiric cefuroxime 250 mg twice daily pending culture report Call for fever >101 degrees or worsening hematuria   [name], MD  [place] [address] [place],  [address] [phone]

## 2022-01-01 ENCOUNTER — Other Ambulatory Visit: Payer: Self-pay

## 2022-01-02 ENCOUNTER — Other Ambulatory Visit: Payer: Self-pay | Admitting: Internal Medicine

## 2022-01-02 ENCOUNTER — Telehealth: Payer: Self-pay | Admitting: *Deleted

## 2022-01-02 ENCOUNTER — Other Ambulatory Visit: Payer: Self-pay | Admitting: *Deleted

## 2022-01-02 ENCOUNTER — Other Ambulatory Visit
Admission: RE | Admit: 2022-01-02 | Discharge: 2022-01-02 | Disposition: A | Discharge status: Home / Self Care | Payer: 59 | Attending: Cardiology | Admitting: Cardiology

## 2022-01-02 ENCOUNTER — Other Ambulatory Visit: Payer: Self-pay

## 2022-01-02 DIAGNOSIS — I5042 Chronic combined systolic (congestive) and diastolic (congestive) heart failure: Secondary | ICD-10-CM | POA: Diagnosis not present

## 2022-01-02 LAB — BASIC METABOLIC PANEL
Anion gap: 11 (ref 5–15)
BUN: 79 mg/dL — ABNORMAL HIGH (ref 8–23)
CO2: 32 mmol/L (ref 22–32)
Calcium: 9 mg/dL (ref 8.9–10.3)
Chloride: 100 mmol/L (ref 98–111)
Creatinine, Ser: 2.3 mg/dL — ABNORMAL HIGH (ref 0.61–1.24)
GFR, Estimated: 31 mL/min — ABNORMAL LOW (ref >60)
Glucose, Bld: 104 mg/dL — ABNORMAL HIGH (ref 70–99)
Potassium: 3.4 mmol/L — ABNORMAL LOW (ref 3.5–5.1)
Sodium: 143 mmol/L (ref 135–145)

## 2022-01-02 MED ORDER — CEFUROXIME AXETIL 250 MG PO TABS
250.0000 mg | ORAL_TABLET | Freq: Two times a day (BID) | ORAL | 0 refills | Status: DC
  Filled 2022-01-02: qty 14, 7d supply, fill #0

## 2022-01-02 MED FILL — Fluticasone Propionate Nasal Susp 50 MCG/ACT: NASAL | 30 days supply | Qty: 16 | Fill #1 | Status: AC

## 2022-01-02 NOTE — Telephone Encounter (Signed)
Notified patient as instructed, patient pleased. Discussed follow-up appointments, patient agrees  

## 2022-01-02 NOTE — Telephone Encounter (Signed)
-----  Message from [name], MD sent at 01/02/2022  7:37 AM EDT ----- Preliminary urine culture growing a low level of bacteria.  Please send Rx cefuroxime 250 mg twice daily x7 days.  Will contact when final urine culture report is back

## 2022-01-02 NOTE — Progress Notes (Signed)
01/03/2022 4:38 PM   [name] Mar 03, 1961 [id number]  Referring provider: [name], MD [address] [place],  [place] [address]  Urological history: 1.Undesired fertility -vasectomy 2016  2. Testosterone deficiency -contributing factors of age, diabetes and obesity -testosterone level (11/01/2021) 202 and (10/03/2021) 168 -HCT/Hemoglobin (10/2021) 32.4/9.6  3. BPH w/ LU TS - cysto (10/2021) Moderate lateral lobe enlargement with moderate median lobe  prostate - Elevated bladder neck -Moderate trabeculation - TRUS (10/2021) Transrectal Ultrasound performed revealing a 53 gm prostate measuring 4.55 x 4.06 x 5.44 cm (length) -UroLift (12/2021) 6 implants  4. ED -contributing factors of age, BPH, CAD, DM, history of smoking, testosterone deficiency, depression, obesity, HLD, CKD, sleep apnea and HTN -tadalafil 20 mg, daily   5. Prostate cancer screening -PSA (10/2021) 1.8 -considering TRT  -baseline PSA 0.68 (2014) at age 38  6. Renal transplant -DDRT 08/2010 - followed at [facility] transplant clinic - last seen 10/2021 -non-contrast CT 10/2021 - Severe bilateral renal atrophy is noted consistent with end-stage renal disease. No hydronephrosis or renal obstruction is noted. Urinary bladder is unremarkable. Renal transplant is noted in the right lower quadrant without definite evidence of hydronephrosis.  7. Scrotal mass -scrotal US (10/2021) - 6.2 x 2.6 x 5.7 mm extratesticular tunical lesion at the anteromedial aspect of the superior pole of the right testicle, appears mostly if not completely solid and could be a complex tunical cyst or granuloma. Other etiologies are possible. Consider 6-12 month follow-up ultrasound to ensure stability. There is no positive color flow.  8. Varicocele -scrotal US (10/2021) On the left, there is a tangle of multiple ectatic veins in the lateral hemiscrotum measuring up to 4.1 mm with Valsalva consistent with a  varicocele.  9. Hydroceles -scrotal US (10/2021) Small scrotal hydroceles, both with floating debris.  10. Tubular ectasia -scrotal US (10/2021) Left-greater-than-right tubular ectasia of the rete testis.  Chief Complaint  Patient presents with   Hypogonadism    HPI: Carl Other Atienza. is a 61 y.o. male who presents today to discuss AVEED side effects and possibly receive AVEED injection.    He has a lot of concerns regarding the Aveed injection after reading the side effects online and is not wanting to proceed with this type of TRT.    In addition to his concerns regarding testosterone, he continues to have gross hematuria, urgency and frequency and dysuria.  He does state he feels he is emptying his bladder and he has a nice strong urinary stream.  His urine culture resulted just yesterday and he grew out 2 organisms, a small colony of Pseudomonas putida and serratia marcescens.  He had to be changed from his empiric antibiotic to a culture appropriate antibiotic and he started that this morning.  He is also experiencing left-sided flank pain.  He states that the pain is so intense that he almost went to the emergency room last night.  He states this pain started Friday evening.  He endorses fevers, but he states they have been low-grade.  Patient denies any modifying or aggravating factors.  Patient denies any suprapubic.  Patient denies any chills, nausea or vomiting.    PMH: Past Medical History:  Diagnosis Date   Anemia    Aortic atherosclerosis ([place])    Asthma, persistent controlled 02/25/2013   Attention or concentration deficit 04/26/2021   BPH with obstruction/lower urinary tract symptoms 09/25/2014   CAD (coronary artery disease) 2005   a.) LHC 1996 ->  HG stenosis mLAD -> PTCA/PCI with stent x 1 (unk type) -> complicated by CFA pseudoanurysm (required surg);  b.) LHC 08/10/2002  -> 95% LAD, 70% ISR LAD, 80% pOM, 70% mRCA --> CVTS consult; c.) 4v CABG 08/13/2002; c.)  03/2018 MV: Small, fixed inferoapical and apical sep defect. No isc. EF 47% (60-65% by 05/2018 TTE); d.) 02/2021 MV: small Apical inf fixed defect. No isc -> low risk.   Cellulitis and abscess of left leg 07/22/2020   Chronic atrial fibrillation    a.) CHA2DS2VASc = 4 (CHF, HTN, vascular disease history, T2DM);  b.) rate/rhythm maintained without pharmacological intervention; chronically anticoagulated with apixaban   Chronic combined systolic and diastolic CHF (congestive heart failure)    a.  7/18 Echo: EF 45-50%; b. 05/2018 Echo: EF 60-65%, RVSP 74.8; c. 12/2018 Echo: EF 50-55%. Sev dil LA; d. 04/2019 Echo: EF 45-50%; e. 07/2021 Echo: EF 40-45%, GrIII DD; f. 10/2021 Echo: EF 40-45%, mild conc LVH, septal-lat dyssynchrony (LBBB), mildly reduced RV fxn, mild-mod dil LA, mildly dil RA, mild-mod MS, mod AS.   Chronic venous stasis dermatitis of both lower extremities    Cytomegaloviral disease 2017   Diverticulosis of colon 04/22/2013   ESRD (end stage renal disease) ([place])    a.) dialysis dependent 2004-2012; b.) s/p cadaveric RIGHT renal transplant 09/13/2010   Essential hypertension 12/17/2010   GERD (gastroesophageal reflux disease)    Gout    History of 2019 novel coronavirus disease (COVID-19) 06/30/2019   History of renal transplant 09/13/2010   a.) s/p cadaveric donor transplant 09/13/2010   Hx of bilateral cataract extraction 10/2017   Hyperlipidemia    Hypogonadism in male 09/25/2014   Ischemic cardiomyopathy    Left cephalic vein thrombosis 97/9892   Long term current use of anticoagulant    a.) apixaban   Long term current use of immunosuppressive drug    a.) mycophenolate + prednisone + tacrolimus   Major depressive disorder 10/04/2013   Mitral stenosis 11/07/2021   a. TTE 11/07/2021: severe MAC, mild-mod MS (MPG 6 mmHg)   Moderate aortic stenosis    a. 10/2021 Echo: Mod AS. AVA 1.06cm^2 (VTI). Mean grad 88mHg.   Obesity hypoventilation syndrome 03/31/2012   OSA treated with  BiPAP 09/29/2012   PAH (pulmonary artery hypertension)    a. 04/2019 RHC: RA 19, RV 80/20, PA 78/31 (51), PCWP 25, CO/CI 7.69/3.26. PVR 3.25 WU-->Sev PAH, likely primarily PV HTN.   S/P CABG x 4 08/13/2002   a.) LIMA-LAD, LRA-OM, SVG-D1, SVG-dRCA   S/P gastric bypass    Synovitis of finger 06/11/2019   Trigger finger, unspecified little finger 12/23/2018   Type 2 diabetes mellitus with hyperglycemia, with long-term current use of insulin 09/09/2014   Ulcer 05/2016   Left shin   Wears glasses     Surgical History: Past Surgical History:  Procedure Laterality Date   CARDIAC CATHETERIZATION N/A 08/10/2002   CATARACT EXTRACTION W/PHACO Right 10/29/2017   Procedure: CATARACT EXTRACTION PHACO AND INTRAOCULAR LENS PLACEMENT ([place]  RIGHT DIABETIC;  Surgeon: BLeandrew Koyanagi MD;  [facility]  Service: Ophthalmology;  Laterality: Right;  Diabetic - insulin   CATARACT EXTRACTION W/PHACO Left 11/18/2017   Procedure: CATARACT EXTRACTION PHACO AND INTRAOCULAR LENS PLACEMENT ([place] LEFT IVA/TOPICAL;  Surgeon: BLeandrew Koyanagi MD;  [place] [facility]  Service: Ophthalmology;  Laterality: Left;  DIABETES - insulin sleep apnea   COLONOSCOPY WITH PROPOFOL N/A 04/22/2013   Procedure: COLONOSCOPY WITH PROPOFOL;  Surgeon: DMilus Banister MD;  [facility];  Service: Endoscopy;  Laterality: N/A;   CORONARY ANGIOPLASTY WITH STENT PLACEMENT N/A 1996   CORONARY ARTERY BYPASS GRAFT N/A 08/13/2002   Procedure: 4v CORONARY ARTERY BYPASS GRAFTING; Location[name]; Surgeon: [name], MD   CYSTOSCOPY WITH INSERTION OF UROLIFT N/A 12/25/2021   Procedure: CYSTOSCOPY WITH INSERTION OF UROLIFT;  Surgeon: [name], MD;  Location: [facility];  Service: Urology;  Laterality: N/A;   DG ANGIO AV SHUNT*L*     right and left upper arms   FASCIOTOMY  03/03/2012   Procedure: FASCIOTOMY;  Surgeon: [name], MD;  Location: [facility];  Service:  Orthopedics;  Laterality: Right;  FASCIOTOMY RIGHT SMALL FINGER   FASCIOTOMY Left 08/17/2013   Procedure: FASCIOTOMY LEFT RING;  Surgeon: [name], MD;  Location: [facility];  Service: Orthopedics;  Laterality: Left;   INCISION AND DRAINAGE ABSCESS Left 10/15/2015   Procedure: INCISION AND DRAINAGE ABSCESS;  Surgeon: [name], MD;  Location: [facility];  Service: General;  Laterality: Left;   KIDNEY TRANSPLANT  09/13/2010   cadaver--at [facility] HEART CATH N/A 11/15/2016   Procedure: RIGHT HEART CATH;  Surgeon: [name], MD;  Location: [facility];  Service: Cardiovascular;  Laterality: N/A;   RIGHT HEART CATH N/A 05/03/2019   Procedure: RIGHT HEART CATH;  Surgeon: [name], MD;  Location: [place] [facility];  Service: Cardiovascular;  Laterality: N/A;   ROUX-EN-Y GASTRIC BYPASS N/A 2015   TYMPANIC MEMBRANE REPAIR Left 03/2010   VASECTOMY      Home Medications:  Allergies as of 01/03/2022       Reactions   Contrast Media [iodinated Contrast Media] Other (See Comments)   Kidney transplant   Iodine Other (See Comments)   Other reaction(s): Other (See Comments) Kidney transplant   Ibuprofen Other (See Comments)   Due to kidney transplant Due to kidney transplant Due to kidney transplant        Medication List        Accurate as of January 03, 2022  4:38 PM. If you have any questions, ask your nurse or doctor.          STOP taking these medications    cefUROXime 250 MG tablet Commonly known as: CEFTIN Stopped by: Camryn Quesinberry, PA-C   sulfamethoxazole-trimethoprim 400-80 MG tablet Commonly known as: BACTRIM Replaced by: sulfamethoxazole-trimethoprim 800-160 MG tablet Stopped by: Gaspar Cola, CMA       TAKE these medications    albuterol 108 (90 Base) MCG/ACT inhaler Commonly known as: VENTOLIN HFA Inhale 2 puffs into the lungs every 6 (six) hours as needed for wheezing or shortness of breath.    albuterol (2.5 MG/3ML) 0.083% nebulizer solution Commonly known as: PROVENTIL Inhale one vial (3 ml) via nebulizer every 6 hours as needed for wheezing or shortness of breath (Take 3 mLs (2.5 mg total) by nebulization every 6 (six) hours as needed for wheezing or shortness of breath.)   allopurinol 100 MG tablet Commonly known as: ZYLOPRIM Take 100 mg by mouth daily.   beclomethasone 80 MCG/ACT inhaler Commonly known as: QVAR Inhale 2 puffs into the lungs 2 (two) times daily. What changed:  when to take this reasons to take this   buPROPion 150 MG 12 hr tablet Commonly known as: WELLBUTRIN SR TAKE 1 TABLET BY MOUTH TWICE DAILY   cholecalciferol 25 MCG (1000 UNIT) tablet Commonly known as: VITAMIN D3 Take 1,000 Units by mouth at  bedtime.   colchicine 0.6 MG tablet Take 1 tablet (0.6 mg total) by mouth every other day   COVID-19 mRNA vaccine 2023-2024 Susp injection Commonly known as: COMIRNATY Inject into the muscle.   Eliquis 5 MG Tabs tablet Generic drug: apixaban TAKE 1 TABLET BY MOUTH TWICE DAILY   Farxiga 10 MG Tabs tablet Generic drug: dapagliflozin propanediol Take 1 tablet (10 mg total) by mouth daily before breakfast.   Ferrex 150 150 MG capsule Generic drug: iron polysaccharides TAKE 1 CAPSULE BY MOUTH 2 TIMES DAILY.   fluticasone 50 MCG/ACT nasal spray Commonly known as: FLONASE PLACE 2 SPRAYS INTO BOTH NOSTRILS DAILY.   FreeStyle Libre 3 Sensor Misc Use one every 2 weeks.   gabapentin 300 MG capsule Commonly known as: NEURONTIN TAKE 1 CAPSULE BY MOUTH TWICE DAILY   HumaLOG KwikPen 100 UNIT/ML KwikPen Generic drug: insulin lispro INJECT UP TO 50 UNITS UNDER THE SKIN DAILY AS DIRECTED (INJECT UP TO 50 UNITS UNDER THE SKIN DAILY AS DIRECTED)   hydrALAZINE 25 MG tablet Commonly known as: APRESOLINE Take 1/2 tablet (12.5 mg total) by mouth every 8 (eight) hours.   hydrocortisone 2.5 % cream Apply topically 3 (three) times daily as needed.    lidocaine 5 % Commonly known as: LIDODERM Place 1 patch onto the skin daily. Remove & Discard patch within 12 hours or as directed by MD What changed: additional instructions   metolazone 2.5 MG tablet Commonly known as: ZAROXOLYN Take 1 tablet by mouth 3 times weekly alternating with 2 times weekly.   montelukast 10 MG tablet Commonly known as: SINGULAIR TAKE 1 TABLET BY MOUTH AT BEDTIME.   multivitamin with minerals Tabs tablet Take 1 tablet by mouth daily.   mycophenolate 180 MG EC tablet Commonly known as: MYFORTIC Take 360 mg by mouth 2 (two) times daily.   omeprazole 20 MG capsule Commonly known as: PRILOSEC TAKE 1 CAPSULE BY MOUTH DAILY.   oxyCODONE 5 MG immediate release tablet Commonly known as: Oxy IR/ROXICODONE Take 1 tablet (5 mg total) by mouth every 4 (four) hours as needed.   Potassium Chloride CR 8 MEQ Cpcr capsule CR Commonly known as: MICRO-K Take 13 capsules (104 mEq total) by mouth 2 (two) times daily.   predniSONE 5 MG tablet Commonly known as: DELTASONE Take 1 tablet (5 mg total) by mouth daily.   predniSONE 10 MG tablet Commonly known as: DELTASONE Take 4 tablets by mouth daily for 2 days, 3 tablets for 2 days, 2 tablets for 2 days, then 1 tablet for 2 days (Take 4 tablets (40 mg total) by mouth once daily for 2 days, THEN 3 tablets (30 mg total) once daily for 2 days, THEN 2 tablets (20 mg total) once daily for 2 days, THEN 1 tablet (10 mg total) once daily for 2 days.)   rosuvastatin 10 MG tablet Commonly known as: CRESTOR Take 1 tablet (10 mg total) by mouth daily.   Semglee (yfgn) 100 UNIT/ML Pen Generic drug: insulin glargine-yfgn Inject 40 Units subcutaneously once daily   sulfamethoxazole-trimethoprim 800-160 MG tablet Commonly known as: BACTRIM DS Take 1 tablet by mouth 2 (two) times daily for 7 days. Replaces: sulfamethoxazole-trimethoprim 400-80 MG tablet Started by: Gaspar Cola, CMA   tacrolimus 1 MG capsule Commonly  known as: PROGRAF Take 2 capsules (2 mg total) by mouth 2 times daily.   tadalafil (PAH) 20 MG tablet Commonly known as: ADCIRCA Take 1 tablet (20 mg total) by mouth daily.   tamsulosin 0.4 MG Caps  capsule Commonly known as: FLOMAX TAKE 1 CAPSULE BY MOUTH DAILY.   Testosterone 20.25 MG/ACT (1.62%) Gel Place 2 Pump onto the skin daily. Started by: Zara Council, PA-C   torsemide 100 MG tablet Commonly known as: DEMADEX Take 1 tablet (100 mg total) by mouth 2 (two) times daily.   Unifine Pentips 31G X 5 MM Misc Generic drug: Insulin Pen Needle Use 4 times daily as directed   zinc sulfate 220 (50 Zn) MG capsule Take 220 mg by mouth daily.        Allergies:  Allergies  Allergen Reactions   Contrast Media [Iodinated Contrast Media] Other (See Comments)    Kidney transplant   Iodine Other (See Comments)    Other reaction(s): Other (See Comments) Kidney transplant   Ibuprofen Other (See Comments)    Due to kidney transplant Due to kidney transplant Due to kidney transplant    Family History: Family History  Problem Relation Age of Onset   Heart disease Father    Kidney failure Father    Kidney disease Father    Diabetes Maternal Grandmother    Breast cancer Maternal Grandmother    Valvular heart disease Mother    Liver cancer Paternal Uncle    Liver cancer Paternal Grandmother    Prostate cancer Neg Hx     Social History:  reports that he quit smoking about 27 years ago. His smoking use included cigars. He has never been exposed to tobacco smoke. He has never used smokeless tobacco. He reports current alcohol use. He reports that he does not use drugs.  ROS: Pertinent ROS in HPI  Physical Exam: BP (!) 153/72   Pulse 89   Ht _0  (1.753 m)   Wt 253 lb (114.8 kg)   BMI 37.36 kg/m   Constitutional:  Well nourished. Alert and oriented, No acute distress. HEENT: [place] AT, moist mucus membranes.  Trachea midline Cardiovascular: No clubbing, cyanosis, or  edema. Respiratory: Normal respiratory effort, no increased work of breathing. Neurologic: Grossly intact, no focal deficits, moving all 4 extremities. Psychiatric: Normal mood and affect.   Laboratory Data:    Latest Ref Rng & Units 01/02/2022   10:32 AM 12/25/2021    8:53 AM 12/07/2021   10:38 AM  BMP  Glucose 70 - 99 mg/dL 104  117  120   BUN 8 - 23 mg/dL 79  69  42   Creatinine 0.61 - 1.24 mg/dL 2.30  2.60  1.78   Sodium 135 - 145 mmol/L 143  140  141   Potassium 3.5 - 5.1 mmol/L 3.4  3.0  4.2   Chloride 98 - 111 mmol/L 100  95  100   CO2 22 - 32 mmol/L 32   32   Calcium 8.9 - 10.3 mg/dL 9.0   9.3    I have reviewed the labs.   Pertinent Imaging: N/A  Assessment & Plan:    1. Testosterone deficiency -He would like to reconsider his form of TRT at this time as he would like to start therapy, but he does not want a long-acting agent and he would also like an agent that we could start at a very low dose and slowly titrate up to see his response -I recommended that we start with AndroGel 1.62% with 1 pump every other day and we can recheck blood pressure, CMP and CBC in 3 months -He is agreeable to this, I have sent over a script for the AndroGel for him  2. Left  flank pain -His CT in August of this year had a a 9.5 x 7.9 cm ill-defined area of fat stranding in the left pararenal space inferior and lateral to the left kidney most consistent with focal inflammation or infarction with no fluid collection at the time of this admission in August-this is improved since previous CT scan -Today CT scan also notes some findings that may represent pancreatitis, but that was ruled out in August -We will obtain a STAT non-contrast CT today  -STAT CT renal stone study showed new hyperdense soft tissue of the left pelvic sidewall causing mass effect on the prostate and bladder, likely postprocedural hematoma related to prior Urolift procedure. -He did not return back to the office after the CT  scan so I have sent him a MyChart message regarding the results and our follow-up plan  3. Urgency -Likely due to the irritation of the hematoma -We can leave Gemtesa samples upfront for him if he is interested to help with the symptoms while the hematoma resolves    Return in about 3 months (around 04/05/2022) for testosterone, H & H and BP check .  These notes generated with voice recognition software. I apologize for typographical errors.  [place], [place] [address]  [place], [place] [address] [phone]

## 2022-01-03 ENCOUNTER — Ambulatory Visit
Admission: RE | Admit: 2022-01-03 | Discharge: 2022-01-03 | Disposition: A | Discharge status: Home / Self Care | Payer: 59 | Source: Ambulatory Visit | Attending: Urology | Admitting: Urology

## 2022-01-03 ENCOUNTER — Telehealth: Payer: Self-pay | Admitting: *Deleted

## 2022-01-03 ENCOUNTER — Other Ambulatory Visit: Payer: Self-pay | Admitting: *Deleted

## 2022-01-03 ENCOUNTER — Encounter: Payer: Self-pay | Admitting: Urology

## 2022-01-03 ENCOUNTER — Other Ambulatory Visit: Payer: Self-pay | Admitting: Internal Medicine

## 2022-01-03 ENCOUNTER — Other Ambulatory Visit: Payer: Self-pay

## 2022-01-03 ENCOUNTER — Ambulatory Visit (INDEPENDENT_AMBULATORY_CARE_PROVIDER_SITE_OTHER): Payer: 59 | Admitting: Urology

## 2022-01-03 VITALS — BP 153/72 | HR 89 | Ht 69.0 in | Wt 253.0 lb

## 2022-01-03 DIAGNOSIS — N3090 Cystitis, unspecified without hematuria: Secondary | ICD-10-CM | POA: Diagnosis not present

## 2022-01-03 DIAGNOSIS — E291 Testicular hypofunction: Secondary | ICD-10-CM

## 2022-01-03 DIAGNOSIS — K766 Portal hypertension: Secondary | ICD-10-CM | POA: Diagnosis not present

## 2022-01-03 DIAGNOSIS — K859 Acute pancreatitis without necrosis or infection, unspecified: Secondary | ICD-10-CM | POA: Diagnosis not present

## 2022-01-03 DIAGNOSIS — R3915 Urgency of urination: Secondary | ICD-10-CM | POA: Diagnosis not present

## 2022-01-03 DIAGNOSIS — K7460 Unspecified cirrhosis of liver: Secondary | ICD-10-CM | POA: Diagnosis not present

## 2022-01-03 DIAGNOSIS — E349 Endocrine disorder, unspecified: Secondary | ICD-10-CM

## 2022-01-03 DIAGNOSIS — I200 Unstable angina: Secondary | ICD-10-CM

## 2022-01-03 DIAGNOSIS — R109 Unspecified abdominal pain: Secondary | ICD-10-CM

## 2022-01-03 LAB — CULTURE, URINE COMPREHENSIVE

## 2022-01-03 MED ORDER — COVID-19 MRNA 2023-2024 VACCINE (COMIRNATY) 0.3 ML INJECTION
INTRAMUSCULAR | 0 refills | Status: DC
  Filled 2022-01-03: qty 0.3, 1d supply, fill #0

## 2022-01-03 MED ORDER — TESTOSTERONE 20.25 MG/ACT (1.62%) TD GEL
2.0000 | Freq: Every day | TRANSDERMAL | 3 refills | Status: DC
  Filled 2022-01-03 – 2022-01-14 (×2): qty 75, 30d supply, fill #0

## 2022-01-03 MED ORDER — SULFAMETHOXAZOLE-TRIMETHOPRIM 800-160 MG PO TABS
1.0000 | ORAL_TABLET | Freq: Two times a day (BID) | ORAL | 0 refills | Status: AC
  Filled 2022-01-03: qty 14, 7d supply, fill #0

## 2022-01-03 NOTE — Telephone Encounter (Signed)
Notified patient as instructed, patient pleased. Discussed follow-up appointments, patient agrees

## 2022-01-03 NOTE — Telephone Encounter (Signed)
-----  Message from [name], MD sent at 01/03/2022  1:04 PM EDT ----- Final urine culture result grew 2 different bacteria.  One was resistant to the prescribed antibiotic and the other was not tested for that particular antibiotic.  Have him stop the cefuroxime and please send in Rx Septra DS 1 twice daily x7 days

## 2022-01-04 ENCOUNTER — Other Ambulatory Visit: Payer: Self-pay

## 2022-01-04 ENCOUNTER — Other Ambulatory Visit: Payer: Self-pay | Admitting: Urology

## 2022-01-04 ENCOUNTER — Telehealth: Payer: Self-pay | Admitting: Urology

## 2022-01-04 DIAGNOSIS — R109 Unspecified abdominal pain: Secondary | ICD-10-CM

## 2022-01-04 MED ORDER — OXYCODONE HCL 5 MG PO TABS
5.0000 mg | ORAL_TABLET | ORAL | 0 refills | Status: DC | PRN
  Filled 2022-01-04: qty 5, 1d supply, fill #0

## 2022-01-04 NOTE — Telephone Encounter (Signed)
Pharmacy:  [facility] pharmacy

## 2022-01-04 NOTE — Telephone Encounter (Signed)
Pt states it is his back left flank. He goes on to say it is only when he is lying down, not when he is standing or sitting up.

## 2022-01-04 NOTE — Progress Notes (Signed)
Patient notified.  

## 2022-01-04 NOTE — Telephone Encounter (Signed)
Patient called the office today to report "extreme left flank pain".  Patient states that he is not able to lay down, not able to sleep x 4 days.  He was seen in the office yesterday and had a CT scan.  Patient is requesting "something for pain and something to help him sleep"  Please advise.

## 2022-01-04 NOTE — Progress Notes (Signed)
We called to get further clarification regarding where [name] pain was located and he stated it was in his left lower back into the left groin area which may be due to the irritation of the hematoma.  He states the pain only bothers him when he lays down, but is relieved by standing up and walking around.  I would also advise him to check with his PCP to make sure he does not have a muscle skeletal issue causing his discomfort as well.  I have sent 5 tablets of the oxycodone 5 mg to the pharmacy.  They can be taken up to every 4 hours as needed for pain, but I recommend taking them in the evening as it seems the pain is only present when he is laying down.  If the pain continues to be unmanageable with the oxycodone 5 mg daily or his pain continues to worsen, he develops high fevers of 102, chills, nausea or vomiting, he needs to seek treatment in the ED.

## 2022-01-07 ENCOUNTER — Other Ambulatory Visit: Payer: Self-pay

## 2022-01-07 DIAGNOSIS — E662 Morbid (severe) obesity with alveolar hypoventilation: Secondary | ICD-10-CM | POA: Diagnosis not present

## 2022-01-07 DIAGNOSIS — G4733 Obstructive sleep apnea (adult) (pediatric): Secondary | ICD-10-CM | POA: Diagnosis not present

## 2022-01-07 MED FILL — Omeprazole Cap Delayed Release 20 MG: ORAL | 90 days supply | Qty: 90 | Fill #0 | Status: AC

## 2022-01-07 MED FILL — Polysaccharide Iron Complex Cap 150 MG (Iron Equivalent): ORAL | 90 days supply | Qty: 180 | Fill #0 | Status: AC

## 2022-01-08 ENCOUNTER — Other Ambulatory Visit: Payer: Self-pay

## 2022-01-09 ENCOUNTER — Other Ambulatory Visit: Payer: Self-pay

## 2022-01-14 ENCOUNTER — Other Ambulatory Visit: Payer: Self-pay

## 2022-01-15 ENCOUNTER — Encounter (HOSPITAL_COMMUNITY): Payer: Self-pay

## 2022-01-15 ENCOUNTER — Other Ambulatory Visit (HOSPITAL_COMMUNITY): Payer: Self-pay

## 2022-01-15 NOTE — Progress Notes (Signed)
Today had a home visit with [name].  He states feeling much better since his procedure.  He likes to stay active doing things such as gardening, cooking and traveling.  He denies problems such as chest pain, headaches, dizziness or increased shortness of breath.  He has all his medications and aware of how to take them.  Mood is good, talks a lot of his grandson and family.  Very pleasant man.  He is aware of any up coming appointments.  He is aware of high sodium foods and watches how much fluids he intakes a day.  Will continue to visit for heart failure, diet and medication management.   [place] [phone]

## 2022-01-16 ENCOUNTER — Telehealth: Payer: Self-pay

## 2022-01-16 NOTE — Telephone Encounter (Signed)
Incoming call on triage line from pt regarding his testosterone gel. Pt states he has been shaky following gel application. He reports it is intermittent and subsides around the evening time. Pt states he will skip gel application tomorrow and resume the day after to see if the shakes continue or not with or without the gel. Advised pt to let [place] [place] know the outcome, pt expressed understanding.

## 2022-01-17 ENCOUNTER — Other Ambulatory Visit: Payer: Self-pay

## 2022-01-17 ENCOUNTER — Other Ambulatory Visit: Payer: Self-pay | Admitting: Cardiovascular Disease

## 2022-01-17 DIAGNOSIS — E1142 Type 2 diabetes mellitus with diabetic polyneuropathy: Secondary | ICD-10-CM | POA: Diagnosis not present

## 2022-01-18 DIAGNOSIS — M1A00X1 Idiopathic chronic gout, unspecified site, with tophus (tophi): Secondary | ICD-10-CM | POA: Diagnosis not present

## 2022-01-18 DIAGNOSIS — D485 Neoplasm of uncertain behavior of skin: Secondary | ICD-10-CM | POA: Diagnosis not present

## 2022-01-18 NOTE — Telephone Encounter (Signed)
Please schedule overdue F/U appointment, Thank you!

## 2022-01-21 ENCOUNTER — Ambulatory Visit (INDEPENDENT_AMBULATORY_CARE_PROVIDER_SITE_OTHER): Payer: 59 | Admitting: Internal Medicine

## 2022-01-21 ENCOUNTER — Encounter (INDEPENDENT_AMBULATORY_CARE_PROVIDER_SITE_OTHER): Payer: Self-pay

## 2022-01-21 ENCOUNTER — Encounter: Payer: Self-pay | Admitting: Internal Medicine

## 2022-01-21 ENCOUNTER — Other Ambulatory Visit: Payer: Self-pay

## 2022-01-21 DIAGNOSIS — K648 Other hemorrhoids: Secondary | ICD-10-CM

## 2022-01-21 DIAGNOSIS — M1000 Idiopathic gout, unspecified site: Secondary | ICD-10-CM

## 2022-01-21 DIAGNOSIS — I200 Unstable angina: Secondary | ICD-10-CM

## 2022-01-21 MED ORDER — HYDROCORTISONE 2.5 % EX CREA
TOPICAL_CREAM | Freq: Three times a day (TID) | CUTANEOUS | 3 refills | Status: DC | PRN
  Filled 2022-01-21: qty 30, 30d supply, fill #0
  Filled 2022-02-19: qty 30, 30d supply, fill #1
  Filled 2022-09-19: qty 30, 30d supply, fill #2
  Filled 2022-10-31: qty 30, 30d supply, fill #3

## 2022-01-21 NOTE — Assessment & Plan Note (Signed)
Bleeding seems better--none on my finger after exam No action about the warty growths---unless symptoms Will try hydrocortisone cream 2.5% tid

## 2022-01-21 NOTE — Assessment & Plan Note (Signed)
Tophus in right 3rd finger ---broke the skin surface Probably needs more allopurinol--he will review with Dr Posey Pronto

## 2022-01-21 NOTE — Progress Notes (Signed)
Subjective:    Patient ID: [name]., male    DOB: 1961-02-27, 61 y.o.   MRN: [id number]  HPI Here due to trouble with hemorrhoids  Ongoing issues with hemorrhoids 4 mornings ago--awoke with blood on the sheets Then was just dripping in the bowl Has packed with tissue---but still bleeding Some pain  Has used OTC suppositories Preparation H gel--up inside Does feel them at times--they will "pop out"  Current Outpatient Medications on File Prior to Visit  Medication Sig Dispense Refill   albuterol (PROVENTIL) (2.5 MG/3ML) 0.083% nebulizer solution Take 3 mLs (2.5 mg total) by nebulization every 6 (six) hours as needed for wheezing or shortness of breath. 150 mL 0   albuterol (VENTOLIN HFA) 108 (90 Base) MCG/ACT inhaler Inhale 2 puffs into the lungs every 6 (six) hours as needed for wheezing or shortness of breath. 18 g 5   allopurinol (ZYLOPRIM) 100 MG tablet Take 100 mg by mouth daily.     apixaban (ELIQUIS) 5 MG TABS tablet TAKE 1 TABLET BY MOUTH TWICE DAILY 180 tablet 1   beclomethasone (QVAR) 80 MCG/ACT inhaler Inhale 2 puffs into the lungs 2 (two) times daily. (Patient taking differently: Inhale 2 puffs into the lungs 2 (two) times daily as needed.) 10.6 g 5   buPROPion (WELLBUTRIN SR) 150 MG 12 hr tablet TAKE 1 TABLET BY MOUTH TWICE DAILY 180 tablet 3   cholecalciferol (VITAMIN D3) 25 MCG (1000 UNIT) tablet Take 1,000 Units by mouth at bedtime.     colchicine 0.6 MG tablet Take 1 tablet (0.6 mg total) by mouth every other day 30 tablet 1   Continuous Blood Gluc Sensor (FREESTYLE LIBRE 3 SENSOR) MISC Use one every 2 weeks. 6 each 3   dapagliflozin propanediol (FARXIGA) 10 MG TABS tablet Take 1 tablet (10 mg total) by mouth daily before breakfast. 90 tablet 3   fluticasone (FLONASE) 50 MCG/ACT nasal spray PLACE 2 SPRAYS INTO BOTH NOSTRILS DAILY. 16 g 11   gabapentin (NEURONTIN) 300 MG capsule TAKE 1 CAPSULE BY MOUTH TWICE DAILY 180 capsule 3   hydrocortisone 2.5 %  cream Apply topically 3 (three) times daily as needed. 30 g 3   insulin glargine-yfgn (SEMGLEE) 100 UNIT/ML Pen Inject 40 Units subcutaneously once daily 45 mL 3   insulin lispro (HUMALOG KWIKPEN) 100 UNIT/ML KwikPen INJECT UP TO 50 UNITS UNDER THE SKIN DAILY AS DIRECTED 45 mL 3   Insulin Pen Needle (UNIFINE PENTIPS) 31G X 5 MM MISC Use 4 times daily as directed 400 each 3   iron polysaccharides (FERREX 150) 150 MG capsule TAKE 1 CAPSULE BY MOUTH 2 TIMES DAILY. 180 capsule 3   lidocaine (LIDODERM) 5 % Place 1 patch onto the skin daily. Remove & Discard patch within 12 hours or as directed by MD (Patient taking differently: Place 1 patch onto the skin daily. Remove & Discard patch within 12 hours or as directed by MD as needed) 30 patch 0   metolazone (ZAROXOLYN) 2.5 MG tablet Take 1 tablet by mouth 3 times weekly alternating with 2 times weekly. 14 tablet 3   montelukast (SINGULAIR) 10 MG tablet TAKE 1 TABLET BY MOUTH AT BEDTIME. 90 tablet 3   Multiple Vitamin (MULTIVITAMIN WITH MINERALS) TABS tablet Take 1 tablet by mouth daily.     mycophenolate (MYFORTIC) 180 MG EC tablet Take 360 mg by mouth 2 (two) times daily.     omeprazole (PRILOSEC) 20 MG capsule TAKE 1 CAPSULE BY MOUTH DAILY. 90 capsule 3  Potassium Chloride CR (MICRO-K) 8 MEQ CPCR capsule CR Take 13 capsules (104 mEq total) by mouth 2 (two) times daily. 780 capsule 6   predniSONE (DELTASONE) 10 MG tablet Take 4 tablets (40 mg total) by mouth once daily for 2 days, THEN 3 tablets (30 mg total) once daily for 2 days, THEN 2 tablets (20 mg total) once daily for 2 days, THEN 1 tablet (10 mg total) once daily for 2 days. 20 tablet 0   predniSONE (DELTASONE) 5 MG tablet Take 1 tablet (5 mg total) by mouth daily. 90 tablet 3   rosuvastatin (CRESTOR) 10 MG tablet Take 1 tablet (10 mg total) by mouth daily. 90 tablet 3   tacrolimus (PROGRAF) 1 MG capsule Take 2 capsules (2 mg total) by mouth 2 times daily. 120 capsule 5   tadalafil, PAH, (ADCIRCA)  20 MG tablet Take 1 tablet (20 mg total) by mouth daily. 90 tablet 3   tamsulosin (FLOMAX) 0.4 MG CAPS capsule TAKE 1 CAPSULE BY MOUTH DAILY. 90 capsule 3   torsemide (DEMADEX) 100 MG tablet Take 1 tablet (100 mg total) by mouth 2 (two) times daily. 180 tablet 3   zinc sulfate 220 (50 Zn) MG capsule Take 220 mg by mouth daily.     hydrALAZINE (APRESOLINE) 25 MG tablet Take 1/2 tablet (12.5 mg total) by mouth every 8 (eight) hours. 45 tablet 0   No current facility-administered medications on file prior to visit.    Allergies  Allergen Reactions   Contrast Media [Iodinated Contrast Media] Other (See Comments)    Kidney transplant   Iodine Other (See Comments)    Other reaction(s): Other (See Comments) Kidney transplant   Ibuprofen Other (See Comments)    Due to kidney transplant Due to kidney transplant Due to kidney transplant    Past Medical History:  Diagnosis Date   Anemia    Aortic atherosclerosis (HCC)    Asthma, persistent controlled 02/25/2013   Attention or concentration deficit 04/26/2021   BPH with obstruction/lower urinary tract symptoms 09/25/2014   CAD (coronary artery disease) 2005   a.) LHC 1996 -> HG stenosis mLAD -> PTCA/PCI with stent x 1 (unk type) -> complicated by CFA pseudoanurysm (required surg);  b.) LHC 08/10/2002  -> 95% LAD, 70% ISR LAD, 80% pOM, 70% mRCA --> CVTS consult; c.) 4v CABG 08/13/2002; c.) 03/2018 MV: Small, fixed inferoapical and apical sep defect. No isc. EF 47% (60-65% by 05/2018 TTE); d.) 02/2021 MV: small Apical inf fixed defect. No isc -> low risk.   Cellulitis and abscess of left leg 07/22/2020   Chronic atrial fibrillation    a.) CHA2DS2VASc = 4 (CHF, HTN, vascular disease history, T2DM);  b.) rate/rhythm maintained without pharmacological intervention; chronically anticoagulated with apixaban   Chronic combined systolic and diastolic CHF (congestive heart failure)    a.  7/18 Echo: EF 45-50%; b. 05/2018 Echo: EF 60-65%, RVSP 74.8; c.  12/2018 Echo: EF 50-55%. Sev dil LA; d. 04/2019 Echo: EF 45-50%; e. 07/2021 Echo: EF 40-45%, GrIII DD; f. 10/2021 Echo: EF 40-45%, mild conc LVH, septal-lat dyssynchrony (LBBB), mildly reduced RV fxn, mild-mod dil LA, mildly dil RA, mild-mod MS, mod AS.   Chronic venous stasis dermatitis of both lower extremities    Cytomegaloviral disease 2017   Diverticulosis of colon 04/22/2013   ESRD (end stage renal disease) ([place])    a.) dialysis dependent 2004-2012; b.) s/p cadaveric RIGHT renal transplant 09/13/2010   Essential hypertension 12/17/2010   GERD (gastroesophageal reflux disease)  Gout    History of 2019 novel coronavirus disease (COVID-19) 06/30/2019   History of renal transplant 09/13/2010   a.) s/p cadaveric donor transplant 09/13/2010   Hx of bilateral cataract extraction 10/2017   Hyperlipidemia    Hypogonadism in male 09/25/2014   Ischemic cardiomyopathy    Left cephalic vein thrombosis 01/5725   Long term current use of anticoagulant    a.) apixaban   Long term current use of immunosuppressive drug    a.) mycophenolate + prednisone + tacrolimus   Major depressive disorder 10/04/2013   Mitral stenosis 11/07/2021   a. TTE 11/07/2021: severe MAC, mild-mod MS (MPG 6 mmHg)   Moderate aortic stenosis    a. 10/2021 Echo: Mod AS. AVA 1.06cm^2 (VTI). Mean grad 28mHg.   Obesity hypoventilation syndrome 03/31/2012   OSA treated with BiPAP 09/29/2012   PAH (pulmonary artery hypertension)    a. 04/2019 RHC: RA 19, RV 80/20, PA 78/31 (51), PCWP 25, CO/CI 7.69/3.26. PVR 3.25 WU-->Sev PAH, likely primarily PV HTN.   S/P CABG x 4 08/13/2002   a.) LIMA-LAD, LRA-OM, SVG-D1, SVG-dRCA   S/P gastric bypass    Synovitis of finger 06/11/2019   Trigger finger, unspecified little finger 12/23/2018   Type 2 diabetes mellitus with hyperglycemia, with long-term current use of insulin 09/09/2014   Ulcer 05/2016   Left shin   Wears glasses     Past Surgical History:  Procedure Laterality Date    CARDIAC CATHETERIZATION N/A 08/10/2002   CATARACT EXTRACTION W/PHACO Right 10/29/2017   Procedure: CATARACT EXTRACTION PHACO AND INTRAOCULAR LENS PLACEMENT ([place]  RIGHT DIABETIC;  Surgeon: BLeandrew Koyanagi MD;  [place] [facility]  Service: Ophthalmology;  Laterality: Right;  Diabetic - insulin   CATARACT EXTRACTION W/PHACO Left 11/18/2017   Procedure: CATARACT EXTRACTION PHACO AND INTRAOCULAR LENS PLACEMENT ([place] LEFT IVA/TOPICAL;  Surgeon: BLeandrew Koyanagi MD;  [place] [facility]  Service: Ophthalmology;  Laterality: Left;  DIABETES - insulin sleep apnea   COLONOSCOPY WITH PROPOFOL N/A 04/22/2013   Procedure: COLONOSCOPY WITH PROPOFOL;  Surgeon: DMilus Banister MD;  [facility];  Service: Endoscopy;  Laterality: N/A;   CORONARY ANGIOPLASTY WITH STENT PLACEMENT N/A 1996   CORONARY ARTERY BYPASS GRAFT N/A 08/13/2002   Procedure: 4v CORONARY ARTERY BYPASS GRAFTING; [facility] Surgeon: CAllegra Lai MD   CYSTOSCOPY WITH INSERTION OF UROLIFT N/A 12/25/2021   Procedure: CYSTOSCOPY WITH INSERTION OF UROLIFT;  Surgeon: SAbbie Sons MD;  Location: [facility];  Service: Urology;  Laterality: N/A;   DG ANGIO AV SHUNT*L*     right and left upper arms   FASCIOTOMY  03/03/2012   Procedure: FASCIOTOMY;  Surgeon: GWynonia Sours MD;  [place] [facility]  Service: Orthopedics;  Laterality: Right;  FASCIOTOMY RIGHT SMALL FINGER   FASCIOTOMY Left 08/17/2013   Procedure: FASCIOTOMY LEFT RING;  Surgeon: GWynonia Sours MD;  [place] [facility]  Service: Orthopedics;  Laterality: Left;   INCISION AND DRAINAGE ABSCESS Left 10/15/2015   Procedure: INCISION AND DRAINAGE ABSCESS;  Surgeon: DJules Husbands MD;  Location: [facility];  Service: General;  Laterality: Left;   [facility]IDNEY [facility]  [facility]13[facility]2010   [facility] 11/15[facility]2016   Procedure: RIGHT HEART CATH;  Surgeon: AWellington Hampshire MD;  [facility];  Service: Cardiovascular;  Laterality: N/A;   RIGHT HEART CATH N/A 05/03/2019   Procedure: RIGHT HEART CATH;  Surgeon: MLarey Dresser MD;  Location:  [facility] [facility];  Service: Cardiovascular;  Laterality: N/A;   ROUX-EN-Y GASTRIC BYPASS N/A 2015   TYMPANIC MEMBRANE REPAIR Left 03/2010   VASECTOMY      Family History  Problem Relation Age of Onset   Heart disease Father    Kidney failure Father    Kidney disease Father    Diabetes Maternal Grandmother    Breast cancer Maternal Grandmother    Valvular heart disease Mother    Liver cancer Paternal Uncle    Liver cancer Paternal Grandmother    Prostate cancer Neg Hx     Social History   Socioeconomic History   Marital status: Married    Spouse name: Not on file   Number of children: 2   Years of education: 14   Highest education level: Associate degree: academic program  Occupational History   Occupation: Warden/ranger: unemployed    Comment: disabled due to kidney failure  Tobacco Use   Smoking status: Former    Types: Cigars    Quit date: 09/02/1994    Years since quitting: 27.4    Passive exposure: Never   Smokeless tobacco: Never   Tobacco comments:    Occasional, rare cigars  Vaping Use   Vaping Use: Never used  Substance and Sexual Activity   Alcohol use: Yes    Alcohol/week: 0.0 - 1.0 standard drinks of alcohol    Comment: occasional - 12-pack lasts a month   Drug use: No   Sexual activity: Not on file  Other Topics Concern   Not on file  Social History Narrative   Has living will   Wife is health care POA   Would accept resuscitation attempts   No tube feedings if cognitively unaware   Social Determinants of Health   Financial Resource Strain: Not on file  Food Insecurity: Not on file  Transportation Needs: Not on file  Physical Activity: Not on file  Stress: Not on file  Social Connections: Not on file  Intimate Partner Violence: Not on file    Review of Systems No N/V Did have constipation when on pain killers---going okay now. Hasn't had to push lately     Objective:   Physical Exam Constitutional:      Appearance: Normal appearance.  Genitourinary:    Comments: 1.5 cm thin skin tag up from rectum Several small warty growths but none directly near rectum No external hemorrhoids and no clear internal masses---just mild tenderness Neurological:     Mental Status: He is alert.            Assessment & Plan:

## 2022-01-22 ENCOUNTER — Other Ambulatory Visit: Payer: Self-pay

## 2022-01-22 DIAGNOSIS — Z940 Kidney transplant status: Secondary | ICD-10-CM | POA: Diagnosis not present

## 2022-01-22 DIAGNOSIS — N1832 Chronic kidney disease, stage 3b: Secondary | ICD-10-CM | POA: Diagnosis not present

## 2022-01-22 DIAGNOSIS — Z79899 Other long term (current) drug therapy: Secondary | ICD-10-CM | POA: Diagnosis not present

## 2022-01-22 DIAGNOSIS — E1122 Type 2 diabetes mellitus with diabetic chronic kidney disease: Secondary | ICD-10-CM | POA: Diagnosis not present

## 2022-01-22 DIAGNOSIS — E113293 Type 2 diabetes mellitus with mild nonproliferative diabetic retinopathy without macular edema, bilateral: Secondary | ICD-10-CM | POA: Diagnosis not present

## 2022-01-22 DIAGNOSIS — E1159 Type 2 diabetes mellitus with other circulatory complications: Secondary | ICD-10-CM | POA: Diagnosis not present

## 2022-01-22 DIAGNOSIS — Z794 Long term (current) use of insulin: Secondary | ICD-10-CM | POA: Diagnosis not present

## 2022-01-22 DIAGNOSIS — E669 Obesity, unspecified: Secondary | ICD-10-CM | POA: Diagnosis not present

## 2022-01-22 DIAGNOSIS — M0579 Rheumatoid arthritis with rheumatoid factor of multiple sites without organ or systems involvement: Secondary | ICD-10-CM | POA: Diagnosis not present

## 2022-01-22 DIAGNOSIS — E1142 Type 2 diabetes mellitus with diabetic polyneuropathy: Secondary | ICD-10-CM | POA: Diagnosis not present

## 2022-01-22 DIAGNOSIS — E1169 Type 2 diabetes mellitus with other specified complication: Secondary | ICD-10-CM | POA: Diagnosis not present

## 2022-01-22 DIAGNOSIS — M109 Gout, unspecified: Secondary | ICD-10-CM | POA: Diagnosis not present

## 2022-01-22 MED ORDER — INSULIN GLARGINE-YFGN 100 UNIT/ML ~~LOC~~ SOPN
35.0000 [IU] | PEN_INJECTOR | Freq: Every day | SUBCUTANEOUS | 3 refills | Status: DC
  Filled 2022-04-29: qty 30, 85d supply, fill #0
  Filled 2022-07-03: qty 30, 85d supply, fill #1

## 2022-01-22 MED ORDER — PREDNISONE 10 MG PO TABS
ORAL_TABLET | ORAL | 0 refills | Status: DC
  Filled 2022-01-22: qty 20, 8d supply, fill #0

## 2022-01-22 MED ORDER — FEBUXOSTAT 40 MG PO TABS
40.0000 mg | ORAL_TABLET | Freq: Every day | ORAL | 5 refills | Status: DC
  Filled 2022-01-22: qty 90, 30d supply, fill #0
  Filled 2022-02-15: qty 30, 10d supply, fill #0
  Filled 2022-02-20: qty 30, 30d supply, fill #0
  Filled 2022-02-21: qty 90, 90d supply, fill #0
  Filled 2022-03-01: qty 90, 30d supply, fill #0
  Filled 2022-03-08: qty 30, 30d supply, fill #0
  Filled 2022-03-08: qty 90, 30d supply, fill #0
  Filled 2022-05-30: qty 90, 90d supply, fill #0
  Filled 2022-08-23: qty 90, 90d supply, fill #1

## 2022-01-22 MED FILL — Hydralazine HCl Tab 25 MG: ORAL | 30 days supply | Qty: 45 | Fill #0 | Status: AC

## 2022-01-24 ENCOUNTER — Encounter: Payer: Self-pay | Admitting: Urology

## 2022-01-24 ENCOUNTER — Ambulatory Visit (INDEPENDENT_AMBULATORY_CARE_PROVIDER_SITE_OTHER): Payer: 59 | Admitting: Urology

## 2022-01-24 VITALS — BP 161/70 | HR 80 | Ht 69.0 in | Wt 256.0 lb

## 2022-01-24 DIAGNOSIS — E291 Testicular hypofunction: Secondary | ICD-10-CM | POA: Diagnosis not present

## 2022-01-24 DIAGNOSIS — N138 Other obstructive and reflux uropathy: Secondary | ICD-10-CM

## 2022-01-24 DIAGNOSIS — N401 Enlarged prostate with lower urinary tract symptoms: Secondary | ICD-10-CM | POA: Diagnosis not present

## 2022-01-24 LAB — BLADDER SCAN AMB NON-IMAGING: Scan Result: 29

## 2022-01-24 NOTE — Progress Notes (Signed)
01/24/2022 8:37 AM   [name] 07/17/1960 [id number]  Referring provider: [name], MD [name],  [place] [address]  Chief Complaint  Patient presents with   Benign Prostatic Hypertrophy    HPI: 61 y.o. male presents for postop follow-up.  Status post UroLift 12/25/2021 Preop IPSS 26/35 with PVR 86 mL Today he has no complaints and states he is voiding with a excellent stream.  Postoperative dysuria has resolved IPSS today 5/35 and PVR 29 mL  He did start topical testosterone.  He was only doing 1 pump and was "shaking" after applying the gel and had elevated BP   PMH: Past Medical History:  Diagnosis Date   Anemia    Aortic atherosclerosis (HCC)    Asthma, persistent controlled 02/25/2013   Attention or concentration deficit 04/26/2021   BPH with obstruction/lower urinary tract symptoms 09/25/2014   CAD (coronary artery disease) 2005   a.) LHC 1996 -> HG stenosis mLAD -> PTCA/PCI with stent x 1 (unk type) -> complicated by CFA pseudoanurysm (required surg);  b.) LHC 08/10/2002  -> 95% LAD, 70% ISR LAD, 80% pOM, 70% mRCA --> CVTS consult; c.) 4v CABG 08/13/2002; c.) 03/2018 MV: Small, fixed inferoapical and apical sep defect. No isc. EF 47% (60-65% by 05/2018 TTE); d.) 02/2021 MV: small Apical inf fixed defect. No isc -> low risk.   Cellulitis and abscess of left leg 07/22/2020   Chronic atrial fibrillation    a.) CHA2DS2VASc = 4 (CHF, HTN, vascular disease history, T2DM);  b.) rate/rhythm maintained without pharmacological intervention; chronically anticoagulated with apixaban   Chronic combined systolic and diastolic CHF (congestive heart failure)    a.  7/18 Echo: EF 45-50%; b. 05/2018 Echo: EF 60-65%, RVSP 74.8; c. 12/2018 Echo: EF 50-55%. Sev dil LA; d. 04/2019 Echo: EF 45-50%; e. 07/2021 Echo: EF 40-45%, GrIII DD; f. 10/2021 Echo: EF 40-45%, mild conc LVH, septal-lat dyssynchrony (LBBB), mildly reduced RV fxn, mild-mod dil LA, mildly  dil RA, mild-mod MS, mod AS.   Chronic venous stasis dermatitis of both lower extremities    Cytomegaloviral disease 2017   Diverticulosis of colon 04/22/2013   ESRD (end stage renal disease) ([place])    a.) dialysis dependent 2004-2012; b.) s/p cadaveric RIGHT renal transplant 09/13/2010   Essential hypertension 12/17/2010   GERD (gastroesophageal reflux disease)    Gout    History of 2019 novel coronavirus disease (COVID-19) 06/30/2019   History of renal transplant 09/13/2010   a.) s/p cadaveric donor transplant 09/13/2010   Hx of bilateral cataract extraction 10/2017   Hyperlipidemia    Hypogonadism in male 09/25/2014   Ischemic cardiomyopathy    Left cephalic vein thrombosis 57/3220   Long term current use of anticoagulant    a.) apixaban   Long term current use of immunosuppressive drug    a.) mycophenolate + prednisone + tacrolimus   Major depressive disorder 10/04/2013   Mitral stenosis 11/07/2021   a. TTE 11/07/2021: severe MAC, mild-mod MS (MPG 6 mmHg)   Moderate aortic stenosis    a. 10/2021 Echo: Mod AS. AVA 1.06cm^2 (VTI). Mean grad 110mHg.   Obesity hypoventilation syndrome 03/31/2012   OSA treated with BiPAP 09/29/2012   PAH (pulmonary artery hypertension)    a. 04/2019 RHC: RA 19, RV 80/20, PA 78/31 (51), PCWP 25, CO/CI 7.69/3.26. PVR 3.25 WU-->Sev PAH, likely primarily PV HTN.   S/P CABG x 4 08/13/2002   a.) LIMA-LAD, LRA-OM, SVG-D1, SVG-dRCA   S/P gastric bypass  Synovitis of finger 06/11/2019   Trigger finger, unspecified little finger 12/23/2018   Type 2 diabetes mellitus with hyperglycemia, with long-term current use of insulin 09/09/2014   Ulcer 05/2016   Left shin   Wears glasses     Surgical History: Past Surgical History:  Procedure Laterality Date   CARDIAC CATHETERIZATION N/A 08/10/2002   CATARACT EXTRACTION W/PHACO Right 10/29/2017   Procedure: CATARACT EXTRACTION PHACO AND INTRAOCULAR LENS PLACEMENT ([place])  RIGHT DIABETIC;  Surgeon: [name], MD;  Location: [facility];  Service: Ophthalmology;  Laterality: Right;  Diabetic - insulin   CATARACT EXTRACTION W/PHACO Left 11/18/2017   Procedure: CATARACT EXTRACTION PHACO AND INTRAOCULAR LENS PLACEMENT ([place]) LEFT IVA/TOPICAL;  Surgeon: [name], MD;  Location: [facility];  Service: Ophthalmology;  Laterality: Left;  DIABETES - insulin sleep apnea   COLONOSCOPY WITH PROPOFOL N/A 04/22/2013   Procedure: COLONOSCOPY WITH PROPOFOL;  Surgeon: [name], MD;  Location: [facility];  Service: Endoscopy;  Laterality: N/A;   CORONARY ANGIOPLASTY WITH STENT PLACEMENT N/A 1996   CORONARY ARTERY BYPASS GRAFT N/A 08/13/2002   Procedure: 4v CORONARY ARTERY BYPASS GRAFTING; Location: [name]; Surgeon: [name], MD   CYSTOSCOPY WITH INSERTION OF UROLIFT N/A 12/25/2021   Procedure: CYSTOSCOPY WITH INSERTION OF UROLIFT;  Surgeon: [name], MD;  Location: [facility];  Service: Urology;  Laterality: N/A;   DG ANGIO AV SHUNT*L*     right and left upper arms   FASCIOTOMY  03/03/2012   Procedure: FASCIOTOMY;  Surgeon: [name], MD;  Location: [facility];  Service: Orthopedics;  Laterality: Right;  FASCIOTOMY RIGHT SMALL FINGER   FASCIOTOMY Left 08/17/2013   Procedure: FASCIOTOMY LEFT RING;  Surgeon: [name], MD;  Location: [facility];  Service: Orthopedics;  Laterality: Left;   INCISION AND DRAINAGE ABSCESS Left 10/15/2015   Procedure: INCISION AND DRAINAGE ABSCESS;  Surgeon: [name], MD;  Location: [facility];  Service: General;  Laterality: Left;   KIDNEY TRANSPLANT  09/13/2010   cadaver--at [facility] HEART CATH N/A 11/15/2016   Procedure: RIGHT HEART CATH;  Surgeon: [name], MD;  Location: [facility];  Service: Cardiovascular;  Laterality: N/A;   RIGHT HEART CATH N/A 05/03/2019   Procedure: RIGHT HEART CATH;  Surgeon: [name], MD;  Location: [facility];  Service:  Cardiovascular;  Laterality: N/A;   ROUX-EN-Y GASTRIC BYPASS N/A 2015   TYMPANIC MEMBRANE REPAIR Left 03/2010   VASECTOMY      Home Medications:  Allergies as of 01/24/2022       Reactions   Contrast Media [iodinated Contrast Media] Other (See Comments)   Kidney transplant   Iodine Other (See Comments)   Other reaction(s): Other (See Comments) Kidney transplant   Ibuprofen Other (See Comments)   Due to kidney transplant Due to kidney transplant Due to kidney transplant        Medication List        Accurate as of January 24, 2022  8:37 AM. If you have any questions, ask your nurse or doctor.          albuterol 108 (90 Base) MCG/ACT inhaler Commonly known as: VENTOLIN HFA Inhale 2 puffs into the lungs every 6 (six) hours as needed for wheezing or shortness of breath.   albuterol (2.5 MG/3ML) 0.083% nebulizer solution Commonly known as: PROVENTIL Inhale one vial (3 ml) via nebulizer every 6 hours as needed for wheezing  or shortness of breath (Take 3 mLs (2.5 mg total) by nebulization every 6 (six) hours as needed for wheezing or shortness of breath.)   allopurinol 100 MG tablet Commonly known as: ZYLOPRIM Take 100 mg by mouth daily.   beclomethasone 80 MCG/ACT inhaler Commonly known as: QVAR Inhale 2 puffs into the lungs 2 (two) times daily. What changed:  when to take this reasons to take this   buPROPion 150 MG 12 hr tablet Commonly known as: WELLBUTRIN SR TAKE 1 TABLET BY MOUTH TWICE DAILY   cholecalciferol 25 MCG (1000 UNIT) tablet Commonly known as: VITAMIN D3 Take 1,000 Units by mouth at bedtime.   colchicine 0.6 MG tablet Take 1 tablet (0.6 mg total) by mouth every other day   Eliquis 5 MG Tabs tablet Generic drug: apixaban TAKE 1 TABLET BY MOUTH TWICE DAILY   Farxiga 10 MG Tabs tablet Generic drug: dapagliflozin propanediol Take 1 tablet (10 mg total) by mouth daily before breakfast.   febuxostat 40 MG tablet Commonly known as: ULORIC Take  3 tablets (120 mg total) by mouth once daily   Ferrex 150 150 MG capsule Generic drug: iron polysaccharides TAKE 1 CAPSULE BY MOUTH 2 TIMES DAILY.   fluticasone 50 MCG/ACT nasal spray Commonly known as: FLONASE PLACE 2 SPRAYS INTO BOTH NOSTRILS DAILY.   FreeStyle Libre 3 Sensor Misc Use one every 2 weeks.   gabapentin 300 MG capsule Commonly known as: NEURONTIN TAKE 1 CAPSULE BY MOUTH TWICE DAILY   HumaLOG KwikPen 100 UNIT/ML KwikPen Generic drug: insulin lispro INJECT UP TO 50 UNITS UNDER THE SKIN DAILY AS DIRECTED (INJECT UP TO 50 UNITS UNDER THE SKIN DAILY AS DIRECTED)   hydrALAZINE 25 MG tablet Commonly known as: APRESOLINE Take 1/2 tablet (12.5 mg total) by mouth every 8 (eight) hours.   hydrocortisone 2.5 % cream Apply topically 3 (three) times daily as needed.   insulin glargine-yfgn 100 UNIT/ML Pen Commonly known as: SEMGLEE Inject 35 Units subcutaneously once daily   lidocaine 5 % Commonly known as: LIDODERM Place 1 patch onto the skin daily. Remove & Discard patch within 12 hours or as directed by MD What changed: additional instructions   metolazone 2.5 MG tablet Commonly known as: ZAROXOLYN Take 1 tablet by mouth 3 times weekly alternating with 2 times weekly.   montelukast 10 MG tablet Commonly known as: SINGULAIR TAKE 1 TABLET BY MOUTH AT BEDTIME.   multivitamin with minerals Tabs tablet Take 1 tablet by mouth daily.   mycophenolate 180 MG EC tablet Commonly known as: MYFORTIC Take 360 mg by mouth 2 (two) times daily.   omeprazole 20 MG capsule Commonly known as: PRILOSEC TAKE 1 CAPSULE BY MOUTH DAILY.   Potassium Chloride CR 8 MEQ Cpcr capsule CR Commonly known as: MICRO-K Take 13 capsules (104 mEq total) by mouth 2 (two) times daily.   predniSONE 5 MG tablet Commonly known as: DELTASONE Take 1 tablet (5 mg total) by mouth daily.   predniSONE 10 MG tablet Commonly known as: DELTASONE Take 4 tablets by mouth once daily for 2 days, then  3 tablets once daily for 2 days, then 2 tablets once daily for 2 days, then 1 tablet once daily for 2 days. (Take 4 tablets (40 mg total) by mouth once daily for 2 days, THEN 3 tablets (30 mg total) once daily for 2 days, THEN 2 tablets (20 mg total) once daily for 2 days, THEN 1 tablet (10 mg total) once daily for 2 days.)   rosuvastatin  10 MG tablet Commonly known as: CRESTOR Take 1 tablet (10 mg total) by mouth daily.   tacrolimus 1 MG capsule Commonly known as: PROGRAF Take 2 capsules (2 mg total) by mouth 2 times daily.   tadalafil (PAH) 20 MG tablet Commonly known as: ADCIRCA Take 1 tablet (20 mg total) by mouth daily.   tamsulosin 0.4 MG Caps capsule Commonly known as: FLOMAX TAKE 1 CAPSULE BY MOUTH DAILY.   torsemide 100 MG tablet Commonly known as: DEMADEX Take 1 tablet (100 mg total) by mouth 2 (two) times daily.   Unifine Pentips 31G X 5 MM Misc Generic drug: Insulin Pen Needle Use 4 times daily as directed   zinc sulfate 220 (50 Zn) MG capsule Take 220 mg by mouth daily.        Allergies:  Allergies  Allergen Reactions   Contrast Media [Iodinated Contrast Media] Other (See Comments)    Kidney transplant   Iodine Other (See Comments)    Other reaction(s): Other (See Comments) Kidney transplant   Ibuprofen Other (See Comments)    Due to kidney transplant Due to kidney transplant Due to kidney transplant    Family History: Family History  Problem Relation Age of Onset   Heart disease Father    Kidney failure Father    Kidney disease Father    Diabetes Maternal Grandmother    Breast cancer Maternal Grandmother    Valvular heart disease Mother    Liver cancer Paternal Uncle    Liver cancer Paternal Grandmother    Prostate cancer Neg Hx     Social History:  reports that he quit smoking about 27 years ago. His smoking use included cigars. He has never been exposed to tobacco smoke. He has never used smokeless tobacco. He reports current alcohol use.  He reports that he does not use drugs.   Physical Exam: BP (!) 161/70   Pulse 80   Ht _0  (1.753 m)   Wt 256 lb (116.1 kg)   BMI 37.80 kg/m   Constitutional:  Alert and oriented, No acute distress. HEENT: [place] AT Respiratory: Normal respiratory effort, no increased work of breathing. Psychiatric: Normal mood and affect.   Assessment & Plan:    1.  BPH with LUTS Marked improvement in lower urinary tract symptoms 1 month status post UroLift Follow-up 6 months with IPSS/PVR Okay to discontinue tamsulosin  2.  Hypogonadism Atypical side effects with gel application Discussed switching to injections at a lower dose or using a lower dose gel He would like to continue with topical testosterone and Rx generic Fortesta at 50% nml dose sent 2 month PA follow-up for TRT symptom check with testosterone level prior  [name], MD  [facility] [address], [place], [place] [address] [phone]

## 2022-01-27 ENCOUNTER — Other Ambulatory Visit: Payer: Self-pay

## 2022-01-27 MED ORDER — TESTOSTERONE 10 MG/ACT (2%) TD GEL
TRANSDERMAL | 0 refills | Status: DC
  Filled 2022-01-27: qty 60, fill #0
  Filled 2022-01-29 – 2022-04-12 (×4): qty 60, 30d supply, fill #0

## 2022-01-28 ENCOUNTER — Encounter: Payer: Self-pay | Admitting: Medical

## 2022-01-28 ENCOUNTER — Ambulatory Visit: Payer: 59 | Attending: Medical | Admitting: Medical

## 2022-01-28 DIAGNOSIS — E1165 Type 2 diabetes mellitus with hyperglycemia: Secondary | ICD-10-CM | POA: Diagnosis not present

## 2022-01-29 ENCOUNTER — Other Ambulatory Visit: Payer: Self-pay

## 2022-01-31 ENCOUNTER — Other Ambulatory Visit: Payer: Self-pay

## 2022-02-15 ENCOUNTER — Other Ambulatory Visit: Payer: Self-pay

## 2022-02-19 ENCOUNTER — Other Ambulatory Visit: Payer: Self-pay

## 2022-02-20 ENCOUNTER — Other Ambulatory Visit: Payer: Self-pay

## 2022-02-20 MED FILL — Hydralazine HCl Tab 25 MG: ORAL | 30 days supply | Qty: 45 | Fill #1 | Status: AC

## 2022-02-20 MED FILL — Fluticasone Propionate Nasal Susp 50 MCG/ACT: NASAL | 30 days supply | Qty: 16 | Fill #2 | Status: AC

## 2022-02-21 ENCOUNTER — Telehealth (HOSPITAL_COMMUNITY): Payer: Self-pay | Admitting: Cardiology

## 2022-02-21 ENCOUNTER — Other Ambulatory Visit: Payer: Self-pay

## 2022-02-21 ENCOUNTER — Telehealth (HOSPITAL_COMMUNITY): Payer: Self-pay

## 2022-02-21 DIAGNOSIS — I5042 Chronic combined systolic (congestive) and diastolic (congestive) heart failure: Secondary | ICD-10-CM

## 2022-02-21 NOTE — Telephone Encounter (Signed)
Spoke with Zay today.  He states been doing ok.  Set up a home visit for next week.  Check his up coming appts for cardiology with him, he is aware it is in January.  He was wanting to get blood work done to check his potassium level since its been a month.  Contacted HF clinic in [place] and left message to see if [place] with send an order, he wants to have it done at [facility].  Will visit next week for heart failure, diet and medication compliance.   [place] [phone]

## 2022-02-21 NOTE — Telephone Encounter (Signed)
Order placed for BMET

## 2022-02-22 ENCOUNTER — Other Ambulatory Visit: Payer: Self-pay

## 2022-02-22 ENCOUNTER — Telehealth: Payer: Self-pay | Admitting: Family Medicine

## 2022-02-22 NOTE — Telephone Encounter (Signed)
Patient notified Testosterone Cypionate has been approved [place] reference # [id number] Approval date 02/21/2022-02/21/2023

## 2022-02-25 ENCOUNTER — Other Ambulatory Visit: Payer: Self-pay

## 2022-02-26 ENCOUNTER — Other Ambulatory Visit: Payer: Self-pay

## 2022-02-26 ENCOUNTER — Other Ambulatory Visit (HOSPITAL_COMMUNITY): Payer: Self-pay

## 2022-02-27 ENCOUNTER — Encounter (HOSPITAL_COMMUNITY): Payer: Self-pay

## 2022-02-27 ENCOUNTER — Other Ambulatory Visit: Payer: Self-pay

## 2022-02-27 DIAGNOSIS — E1165 Type 2 diabetes mellitus with hyperglycemia: Secondary | ICD-10-CM | POA: Diagnosis not present

## 2022-02-27 NOTE — Progress Notes (Signed)
Today had a home visit with [name].  He states been doing well.  Urinating well now.  He has all his medications and aware of how to take them.  He has not been skipping any.  He stays active in the yard and playing with his grandson.  He likes to travel and has done some.  He wants to work part time somewhere, he discussed some possibilities of working or volunteering.  He loves to cook.  He enjoys his family getting together for dinners.  Has good family support from his wife he lives with.  Denies chest pain, headaches, dizziness or increased shortness of breath.  He has everything for daily living.  He is aware of up coming appts.  Will continue to visit for heart failure, diet and medication management.   [place] [phone]

## 2022-02-28 ENCOUNTER — Other Ambulatory Visit
Admission: RE | Admit: 2022-02-28 | Discharge: 2022-02-28 | Disposition: A | Discharge status: Home / Self Care | Payer: 59 | Source: Ambulatory Visit | Attending: Cardiology | Admitting: Cardiology

## 2022-02-28 ENCOUNTER — Other Ambulatory Visit: Payer: Self-pay

## 2022-02-28 ENCOUNTER — Other Ambulatory Visit (HOSPITAL_COMMUNITY): Payer: Self-pay

## 2022-02-28 DIAGNOSIS — I5042 Chronic combined systolic (congestive) and diastolic (congestive) heart failure: Secondary | ICD-10-CM | POA: Diagnosis not present

## 2022-02-28 LAB — BASIC METABOLIC PANEL
Anion gap: 9 (ref 5–15)
BUN: 70 mg/dL — ABNORMAL HIGH (ref 8–23)
CO2: 32 mmol/L (ref 22–32)
Calcium: 9 mg/dL (ref 8.9–10.3)
Chloride: 100 mmol/L (ref 98–111)
Creatinine, Ser: 1.94 mg/dL — ABNORMAL HIGH (ref 0.61–1.24)
GFR, Estimated: 38 mL/min — ABNORMAL LOW (ref >60)
Glucose, Bld: 192 mg/dL — ABNORMAL HIGH (ref 70–99)
Potassium: 3 mmol/L — ABNORMAL LOW (ref 3.5–5.1)
Sodium: 141 mmol/L (ref 135–145)

## 2022-02-28 NOTE — Progress Notes (Signed)
Today stopped by to bring [name] a new med box.  He is going for lab work today.  Realized today that he has only been taking 10 potassium capsules a day instead of 13.  He states will start taking 13 today.  Advised him HF clinic will call him results if anything needs to change.  His only complaint is being tired.  Denies chest pain, headaches or dizziness.  Denies increased shortness of breath.  Will continue to visit for heart failure, diet and medication compliance.

## 2022-03-01 ENCOUNTER — Other Ambulatory Visit: Payer: Self-pay

## 2022-03-01 DIAGNOSIS — I272 Pulmonary hypertension, unspecified: Secondary | ICD-10-CM | POA: Diagnosis not present

## 2022-03-01 DIAGNOSIS — I2510 Atherosclerotic heart disease of native coronary artery without angina pectoris: Secondary | ICD-10-CM | POA: Diagnosis not present

## 2022-03-01 DIAGNOSIS — M109 Gout, unspecified: Secondary | ICD-10-CM | POA: Diagnosis not present

## 2022-03-01 DIAGNOSIS — G4733 Obstructive sleep apnea (adult) (pediatric): Secondary | ICD-10-CM | POA: Diagnosis not present

## 2022-03-01 DIAGNOSIS — E1122 Type 2 diabetes mellitus with diabetic chronic kidney disease: Secondary | ICD-10-CM | POA: Diagnosis not present

## 2022-03-01 DIAGNOSIS — E230 Hypopituitarism: Secondary | ICD-10-CM | POA: Diagnosis not present

## 2022-03-01 DIAGNOSIS — K219 Gastro-esophageal reflux disease without esophagitis: Secondary | ICD-10-CM | POA: Diagnosis not present

## 2022-03-01 DIAGNOSIS — Z7960 Long term (current) use of unspecified immunomodulators and immunosuppressants: Secondary | ICD-10-CM | POA: Diagnosis not present

## 2022-03-01 DIAGNOSIS — N400 Enlarged prostate without lower urinary tract symptoms: Secondary | ICD-10-CM | POA: Diagnosis not present

## 2022-03-01 DIAGNOSIS — Z940 Kidney transplant status: Secondary | ICD-10-CM | POA: Diagnosis not present

## 2022-03-01 DIAGNOSIS — I5030 Unspecified diastolic (congestive) heart failure: Secondary | ICD-10-CM | POA: Diagnosis not present

## 2022-03-01 DIAGNOSIS — N1832 Chronic kidney disease, stage 3b: Secondary | ICD-10-CM | POA: Diagnosis not present

## 2022-03-01 DIAGNOSIS — Z23 Encounter for immunization: Secondary | ICD-10-CM | POA: Diagnosis not present

## 2022-03-01 DIAGNOSIS — E669 Obesity, unspecified: Secondary | ICD-10-CM | POA: Diagnosis not present

## 2022-03-01 DIAGNOSIS — Z7984 Long term (current) use of oral hypoglycemic drugs: Secondary | ICD-10-CM | POA: Diagnosis not present

## 2022-03-01 DIAGNOSIS — E876 Hypokalemia: Secondary | ICD-10-CM | POA: Diagnosis not present

## 2022-03-01 DIAGNOSIS — I130 Hypertensive heart and chronic kidney disease with heart failure and stage 1 through stage 4 chronic kidney disease, or unspecified chronic kidney disease: Secondary | ICD-10-CM | POA: Diagnosis not present

## 2022-03-01 DIAGNOSIS — I482 Chronic atrial fibrillation, unspecified: Secondary | ICD-10-CM | POA: Diagnosis not present

## 2022-03-01 DIAGNOSIS — B259 Cytomegaloviral disease, unspecified: Secondary | ICD-10-CM | POA: Diagnosis not present

## 2022-03-01 DIAGNOSIS — N2581 Secondary hyperparathyroidism of renal origin: Secondary | ICD-10-CM | POA: Diagnosis not present

## 2022-03-01 DIAGNOSIS — Z4822 Encounter for aftercare following kidney transplant: Secondary | ICD-10-CM | POA: Diagnosis not present

## 2022-03-01 DIAGNOSIS — I5020 Unspecified systolic (congestive) heart failure: Secondary | ICD-10-CM | POA: Diagnosis not present

## 2022-03-01 DIAGNOSIS — Z794 Long term (current) use of insulin: Secondary | ICD-10-CM | POA: Diagnosis not present

## 2022-03-01 DIAGNOSIS — E559 Vitamin D deficiency, unspecified: Secondary | ICD-10-CM | POA: Diagnosis not present

## 2022-03-01 DIAGNOSIS — D509 Iron deficiency anemia, unspecified: Secondary | ICD-10-CM | POA: Diagnosis not present

## 2022-03-01 DIAGNOSIS — E8881 Metabolic syndrome: Secondary | ICD-10-CM | POA: Diagnosis not present

## 2022-03-01 DIAGNOSIS — Z951 Presence of aortocoronary bypass graft: Secondary | ICD-10-CM | POA: Diagnosis not present

## 2022-03-01 DIAGNOSIS — Z6838 Body mass index (BMI) 38.0-38.9, adult: Secondary | ICD-10-CM | POA: Diagnosis not present

## 2022-03-01 MED ORDER — PREDNISONE 5 MG PO TABS
5.0000 mg | ORAL_TABLET | Freq: Every day | ORAL | 3 refills | Status: DC
  Filled 2022-03-01 – 2022-05-30 (×2): qty 90, 90d supply, fill #0
  Filled 2022-09-13: qty 90, 90d supply, fill #1
  Filled 2022-12-20: qty 90, 90d supply, fill #2
  Filled 2023-01-07 – 2023-01-15 (×2): qty 90, 90d supply, fill #3

## 2022-03-01 MED ORDER — LOSARTAN POTASSIUM 50 MG PO TABS
50.0000 mg | ORAL_TABLET | Freq: Every day | ORAL | 3 refills | Status: DC
  Filled 2022-03-01: qty 90, 90d supply, fill #0

## 2022-03-01 MED FILL — Dapagliflozin Propanediol Tab 10 MG (Base Equivalent): ORAL | 90 days supply | Qty: 90 | Fill #1 | Status: AC

## 2022-03-01 MED FILL — Bupropion HCl Tab ER 12HR 150 MG: ORAL | 90 days supply | Qty: 180 | Fill #3 | Status: AC

## 2022-03-03 ENCOUNTER — Other Ambulatory Visit: Payer: Self-pay

## 2022-03-03 MED ORDER — TACROLIMUS 1 MG PO CAPS
2.0000 mg | ORAL_CAPSULE | Freq: Two times a day (BID) | ORAL | 11 refills | Status: DC
  Filled 2022-03-03: qty 120, 30d supply, fill #0
  Filled 2022-04-16: qty 360, 90d supply, fill #0
  Filled 2022-07-25: qty 360, 90d supply, fill #1
  Filled 2022-11-22: qty 360, 90d supply, fill #2
  Filled 2023-01-07: qty 360, 90d supply, fill #3

## 2022-03-04 ENCOUNTER — Other Ambulatory Visit: Payer: Self-pay

## 2022-03-04 DIAGNOSIS — D631 Anemia in chronic kidney disease: Secondary | ICD-10-CM | POA: Insufficient documentation

## 2022-03-05 ENCOUNTER — Other Ambulatory Visit: Payer: Self-pay

## 2022-03-06 ENCOUNTER — Other Ambulatory Visit: Payer: Self-pay

## 2022-03-06 ENCOUNTER — Telehealth (HOSPITAL_COMMUNITY): Payer: Self-pay

## 2022-03-06 DIAGNOSIS — E876 Hypokalemia: Secondary | ICD-10-CM

## 2022-03-06 DIAGNOSIS — I5042 Chronic combined systolic (congestive) and diastolic (congestive) heart failure: Secondary | ICD-10-CM

## 2022-03-06 MED ORDER — POTASSIUM CHLORIDE ER 8 MEQ PO CPCR
110.0000 meq | ORAL_CAPSULE | Freq: Two times a day (BID) | ORAL | 6 refills | Status: DC
  Filled 2022-03-06: qty 50, 2d supply, fill #0
  Filled 2022-03-06: qty 730, 26d supply, fill #0

## 2022-03-06 NOTE — Telephone Encounter (Signed)
Patient advised and verbalized understanding. Patient will have lab done at [facility]; med list updated to reflect changes.   Meds ordered this encounter  Medications   Potassium Chloride CR (MICRO-K) 8 MEQ CPCR capsule CR    Sig: Take 14 capsules (112 mEq total) by mouth 2 (two) times daily.    Dispense:  780 capsule    Refill:  6    Please cancel all previous orders for current medication. Change in dosage or pill size.   Orders Placed This Encounter  Procedures   Basic metabolic panel    Standing Status:   Future    Standing Expiration Date:   03/07/2023    Order Specific Question:   Release to patient    Answer:   Immediate    Order Specific Question:   Release to patient    Answer:   Immediate [1]

## 2022-03-06 NOTE — Telephone Encounter (Signed)
-----  Message from [name], MD sent at 02/28/2022  5:52 PM EST ----- Increase total daily KCl by 20 mEq.  BMET 10 days.

## 2022-03-07 ENCOUNTER — Other Ambulatory Visit: Payer: Self-pay

## 2022-03-08 ENCOUNTER — Other Ambulatory Visit: Payer: Self-pay

## 2022-03-12 ENCOUNTER — Other Ambulatory Visit
Admission: RE | Admit: 2022-03-12 | Discharge: 2022-03-12 | Disposition: A | Discharge status: Home / Self Care | Payer: 59 | Attending: Cardiology | Admitting: Cardiology

## 2022-03-12 DIAGNOSIS — I5042 Chronic combined systolic (congestive) and diastolic (congestive) heart failure: Secondary | ICD-10-CM | POA: Diagnosis not present

## 2022-03-12 LAB — BASIC METABOLIC PANEL
Anion gap: 10 (ref 5–15)
BUN: 60 mg/dL — ABNORMAL HIGH (ref 8–23)
CO2: 33 mmol/L — ABNORMAL HIGH (ref 22–32)
Calcium: 9.2 mg/dL (ref 8.9–10.3)
Chloride: 98 mmol/L (ref 98–111)
Creatinine, Ser: 1.99 mg/dL — ABNORMAL HIGH (ref 0.61–1.24)
GFR, Estimated: 37 mL/min — ABNORMAL LOW (ref >60)
Glucose, Bld: 227 mg/dL — ABNORMAL HIGH (ref 70–99)
Potassium: 3.6 mmol/L (ref 3.5–5.1)
Sodium: 141 mmol/L (ref 135–145)

## 2022-03-15 ENCOUNTER — Other Ambulatory Visit: Payer: Self-pay | Admitting: Cardiovascular Disease

## 2022-03-15 ENCOUNTER — Other Ambulatory Visit: Payer: Self-pay

## 2022-03-15 MED ORDER — APIXABAN 5 MG PO TABS
ORAL_TABLET | Freq: Two times a day (BID) | ORAL | 1 refills | Status: DC
  Filled 2022-03-15: qty 180, 90d supply, fill #0
  Filled 2022-06-15: qty 180, 90d supply, fill #1

## 2022-03-15 NOTE — Telephone Encounter (Signed)
Prescription refill request for Eliquis received. Indication:afib Last office visit:1/23 Scr:1.9 Age: 61 Weight:113.9  kg  Prescription refilled

## 2022-03-20 ENCOUNTER — Other Ambulatory Visit: Payer: Self-pay

## 2022-03-21 ENCOUNTER — Other Ambulatory Visit: Payer: Self-pay

## 2022-03-22 ENCOUNTER — Other Ambulatory Visit: Payer: Self-pay

## 2022-03-26 ENCOUNTER — Other Ambulatory Visit: Payer: Self-pay | Admitting: *Deleted

## 2022-03-26 DIAGNOSIS — E349 Endocrine disorder, unspecified: Secondary | ICD-10-CM

## 2022-03-26 DIAGNOSIS — N138 Other obstructive and reflux uropathy: Secondary | ICD-10-CM

## 2022-03-27 ENCOUNTER — Encounter (HOSPITAL_COMMUNITY): Payer: Self-pay | Admitting: Cardiology

## 2022-03-27 ENCOUNTER — Ambulatory Visit (HOSPITAL_COMMUNITY)
Admission: RE | Admit: 2022-03-27 | Discharge: 2022-03-27 | Disposition: A | Discharge status: Home / Self Care | Payer: Commercial Managed Care - PPO | Source: Ambulatory Visit | Attending: Cardiology | Admitting: Cardiology

## 2022-03-27 VITALS — BP 140/78 | HR 73 | Wt 277.0 lb

## 2022-03-27 DIAGNOSIS — Z79621 Long term (current) use of calcineurin inhibitor: Secondary | ICD-10-CM | POA: Insufficient documentation

## 2022-03-27 DIAGNOSIS — I252 Old myocardial infarction: Secondary | ICD-10-CM | POA: Diagnosis not present

## 2022-03-27 DIAGNOSIS — Z794 Long term (current) use of insulin: Secondary | ICD-10-CM | POA: Insufficient documentation

## 2022-03-27 DIAGNOSIS — Z951 Presence of aortocoronary bypass graft: Secondary | ICD-10-CM | POA: Diagnosis not present

## 2022-03-27 DIAGNOSIS — I5022 Chronic systolic (congestive) heart failure: Secondary | ICD-10-CM | POA: Diagnosis not present

## 2022-03-27 DIAGNOSIS — Z79899 Other long term (current) drug therapy: Secondary | ICD-10-CM | POA: Diagnosis not present

## 2022-03-27 DIAGNOSIS — I2721 Secondary pulmonary arterial hypertension: Secondary | ICD-10-CM | POA: Diagnosis not present

## 2022-03-27 DIAGNOSIS — I482 Chronic atrial fibrillation, unspecified: Secondary | ICD-10-CM | POA: Insufficient documentation

## 2022-03-27 DIAGNOSIS — I132 Hypertensive heart and chronic kidney disease with heart failure and with stage 5 chronic kidney disease, or end stage renal disease: Secondary | ICD-10-CM | POA: Diagnosis not present

## 2022-03-27 DIAGNOSIS — J45909 Unspecified asthma, uncomplicated: Secondary | ICD-10-CM | POA: Diagnosis not present

## 2022-03-27 DIAGNOSIS — Z7901 Long term (current) use of anticoagulants: Secondary | ICD-10-CM | POA: Insufficient documentation

## 2022-03-27 DIAGNOSIS — G4733 Obstructive sleep apnea (adult) (pediatric): Secondary | ICD-10-CM | POA: Insufficient documentation

## 2022-03-27 DIAGNOSIS — I5042 Chronic combined systolic (congestive) and diastolic (congestive) heart failure: Secondary | ICD-10-CM

## 2022-03-27 DIAGNOSIS — Z7951 Long term (current) use of inhaled steroids: Secondary | ICD-10-CM | POA: Insufficient documentation

## 2022-03-27 DIAGNOSIS — N186 End stage renal disease: Secondary | ICD-10-CM | POA: Diagnosis not present

## 2022-03-27 DIAGNOSIS — I255 Ischemic cardiomyopathy: Secondary | ICD-10-CM | POA: Insufficient documentation

## 2022-03-27 DIAGNOSIS — Z79890 Hormone replacement therapy (postmenopausal): Secondary | ICD-10-CM | POA: Insufficient documentation

## 2022-03-27 DIAGNOSIS — Z940 Kidney transplant status: Secondary | ICD-10-CM | POA: Diagnosis not present

## 2022-03-27 DIAGNOSIS — E1122 Type 2 diabetes mellitus with diabetic chronic kidney disease: Secondary | ICD-10-CM | POA: Insufficient documentation

## 2022-03-27 DIAGNOSIS — I454 Nonspecific intraventricular block: Secondary | ICD-10-CM | POA: Insufficient documentation

## 2022-03-27 DIAGNOSIS — M109 Gout, unspecified: Secondary | ICD-10-CM | POA: Diagnosis not present

## 2022-03-27 DIAGNOSIS — Z79624 Long term (current) use of inhibitors of nucleotide synthesis: Secondary | ICD-10-CM | POA: Insufficient documentation

## 2022-03-27 DIAGNOSIS — Z7952 Long term (current) use of systemic steroids: Secondary | ICD-10-CM | POA: Insufficient documentation

## 2022-03-27 DIAGNOSIS — I2510 Atherosclerotic heart disease of native coronary artery without angina pectoris: Secondary | ICD-10-CM | POA: Insufficient documentation

## 2022-03-27 DIAGNOSIS — Z7984 Long term (current) use of oral hypoglycemic drugs: Secondary | ICD-10-CM | POA: Insufficient documentation

## 2022-03-27 DIAGNOSIS — Z87891 Personal history of nicotine dependence: Secondary | ICD-10-CM | POA: Insufficient documentation

## 2022-03-27 LAB — LIPID PANEL
Cholesterol: 101 mg/dL (ref 0–200)
HDL: 52 mg/dL (ref >40)
LDL Cholesterol: 35 mg/dL (ref 0–99)
Total CHOL/HDL Ratio: 1.9 RATIO
Triglycerides: 71 mg/dL (ref <150)
VLDL: 14 mg/dL (ref 0–40)

## 2022-03-27 LAB — BASIC METABOLIC PANEL
Anion gap: 9 (ref 5–15)
BUN: 76 mg/dL — ABNORMAL HIGH (ref 8–23)
CO2: 30 mmol/L (ref 22–32)
Calcium: 9 mg/dL (ref 8.9–10.3)
Chloride: 100 mmol/L (ref 98–111)
Creatinine, Ser: 2.44 mg/dL — ABNORMAL HIGH (ref 0.61–1.24)
GFR, Estimated: 29 mL/min — ABNORMAL LOW (ref >60)
Glucose, Bld: 162 mg/dL — ABNORMAL HIGH (ref 70–99)
Potassium: 4.6 mmol/L (ref 3.5–5.1)
Sodium: 139 mmol/L (ref 135–145)

## 2022-03-27 LAB — BRAIN NATRIURETIC PEPTIDE: B Natriuretic Peptide: 458.6 pg/mL — ABNORMAL HIGH (ref 0.0–100.0)

## 2022-03-27 MED ORDER — FUROSCIX 80 MG/10ML ~~LOC~~ CTKT: 80.0000 mg | CARTRIDGE | Freq: Every day | SUBCUTANEOUS | Status: DC

## 2022-03-27 NOTE — Progress Notes (Signed)
Medication Samples have been provided to the patient.  Drug name: Furoscix       Strength: 1m        Qty: 3  LOT:: [id number] Exp.Date: 07/23/23  Dosing instructions: take 1 kit every day for 3 days as directed on your AVS  The patient has been instructed regarding the correct time, dose, and frequency of taking this medication, including desired effects and most common side effects.   [name] M [name] 12:16 PM 03/27/2022

## 2022-03-27 NOTE — Progress Notes (Signed)
Advanced Heart Failure Clinic Note   Date:  03/27/2022   ID:  Aziah Brostrom., DOB [birth year], MRN [id number]   Provider location: [address] [place] Type of Visit:  Established patient  PCP:  [name], MD  Cardiologist:  [name] Nephrology: Dr. Ronnald Ramp  HF Cardiologist: [name]  HPI: Faron Kabeer Hoagland. is a 62 y.o. male  with a history of CAD s/p CABG 2005, chronic atrial fibrillation on Eliquis, ESRD s/p renal transplant 2012, anc chronic diastolic CHF.  His renal function has been fairly stable, most recent creatinine 1.43.  He has been struggling with volume overload/CHF.  He has a history of ischemic cardiomyopathy with EF as low as 35-40% in the past but echo in 3/20 showed EF 60-65%.  There is evidence for RV failure with pulmonary hypertension by echo.    Admitted 11/05/18 low grade fever and shortness of breath. HS Trop negative. Covid 19 negative. Blood CX negative. Placed on antibiotics and discharged to home the next day.   Admitted 11/27/72 with A/C Diastolic HF. Diuresed with IV lasix and transitioned to torsemide 80/40 mg . Discharge weight 252 pounds.   Admitted 06/2019 with left arm pain. Left upper extremity venous Doppler revealed occluded cephalic vein at the AV fistula outflow tract in the antecubital fossa with no other thrombus identified. Vascular evaluated. He continued on Eliquis.  No plan for intervention. Also treated for acute gout flare. Discharged on prednisone taper.     Given IV lasix on 12/21 by HF Paramedicine.   Admitted to [facility] 2/22 with acute gout flare vs cellulitis. Nephrology was consulted to evaluate transplant regimen and follow along. He was treated with antibiotics and prednisone. Discharged on decreased torsemide dose of 40 mg daily. Weight 259 lbs.  Admitted 4/22 for left leg cellulitis after sustaining a dog scratch, received IV antibiotics. No changes to his diuretic therapy. Discharge weight 266  lbs.  Follow up with [name], 1/23, he had CP and underwent Lexiscan myoview, which overall was a low risk study with evidence of prior infarct in the infero-apical area with no significant change from before. Losartan added to GDMT regimen.  Follow up 5/23, volume overloaded, REDs 40%. Advised taking additional metolazone (3 doses that week), however labs showed worsening renal function and advised to keep at 2 doses.  Admitted in 5/23 with acute on chronic CHF and AKI.  Gareth Eagle and losartan stopped. Diuresed with IV lasix. Echo showed EF 40-45%, RV mildly reduced, GIIIDD (restrictive), IVC dilated. Nephrology consulted for diuretic dosing. Discharged home, weight 266 lbs.  He had chest pain in 8/23 and was admitted at [facility], minimal HS-TnI elevation with no trend, thought to be demand ischemia from volume overload.    Echo 8/23 showing EF 40-45%, diffuse hypokinesis with dyssynchrony, mildly dilated RV with mildly decreased systolic function, IVC dilated, moderate AS with mean gradient 15, mild-moderate mitral stenosis with mean gradient 6.   Today he returns for HF follow up. He has gained 25 lbs since last appointment.  He reports dietary indiscretion over the holidays with likely increased sodium intake.  More short of breath walking up inclines and stairs.  No chest pain.  Still does ok walking on flat ground.  Able to walk up to 1 mile/day on treadmill.  No orthopnea/PND.  No lightheadedness.  He remains in atrial fibrillation.   ECG (personally reviewed): AF, IVCD QRS 136 msec  REDs: 43%  Labs (7/19):  LDL 49 Labs (2/20): K 3.7, creatinine 1.43 Labs (4/20): K 4.3, creatinine 1.24 Labs (11/06/18): K 3.7 creatinine 1.23  Labs (05/07/19): K 3.8 Creatinine 1.5  Labs (07/18/19): K 4.3 Creatinine 1.42  Labs (5/21): K 3.3, creatinine 1.21 Labs (11/21): K 3.6, creatinine 1.4 Labs (12/21): K 3.3, creatinine 2.4, hgb 12.8 Labs (04/01/19): K 3.3 Creatinine 2.2  Labs (8/22): K 4.0,  creatinine 1.6, HDL 35, LDL 36, a1c 7.5 Labs (2/23): LDL 31, HDL 36 Labs (4/23): K 3.3, creatinine 1.68 Labs (5/23): K 4.1, creatinine 2.24 Labs (8/23): K 3.5, creatinine 2.36 Labs (12/23): K 3.6, creatinine 1.99, hgb 9.3  PMH: 1. CAD: s/p CABG x 4 in [place] in 2005.  - Cardiolite (1/20): EF 47%, small fixed apical septal defect, no ischemia.  Low risk study.  - Cardiolite (12/22): small apical inferior defect, no changes from prior, no ischemia. Low risk study. 2. Asthma 3. Gout 4. Atrial fibrillation: Chronic.  5. ESRD s/p renal transplantation at [facility] in 2012.  6. Anemia of chronic disease 7. Type II diabetes 8. HTN 9. Hyperlipidemia 10. OSA: On bipap.  11. Ischemic cardiomyopathy: EF 35-40% in past.  - Cardiolite (1/20) with EF 47%.  - Echo (1/20): EF 50-55%, mild MR, RV moderately dilated with moderately decreased systolic function, PASP 73 mmHg, moderate TR.   - Echo (3/20): EF 60-65%, PASP 75 mmHg, mildly dilated RV with mildly decreased systolic function.  - Echo (2/21): EF 45-50%, global HK, moderately dilated RV with moderately decreased systolic function, D-shaped interventricular septum, dilated IVC.  - RHC (2/21): mean RA 19, PA 78/31 mean 51, mean PCWP 26, CI 3.26, PVR 3.25 - Echo (5/23): EF 40-45%, RV mildly reduced, GIIIDD (restrictive) - Echo (8/23): EF 40-45%, diffuse hypokinesis with dyssynchrony, mildly dilated RV with mildly decreased systolic function, IVC dilated, moderate AS with mean gradient 15, mild-moderate mitral stenosis with mean gradient 6.  12. Obesity: s/p gastric bypass in 2015.  13. Bradycardia with beta blocker.  14. Aortic stenosis: Moderate on 8/23 echo.  15. Mitral stenosis: Mild to moderate on 8/23 echo.  16. Rheumatoid arthritis  Past Surgical History:  Procedure Laterality Date   CARDIAC CATHETERIZATION N/A 08/10/2002   CATARACT EXTRACTION W/PHACO Right 10/29/2017   Procedure: CATARACT EXTRACTION PHACO AND INTRAOCULAR LENS  PLACEMENT ([place])  RIGHT DIABETIC;  Surgeon: [name], MD;  Location: [place];  Service: Ophthalmology;  Laterality: Right;  Diabetic - insulin   CATARACT EXTRACTION W/PHACO Left 11/18/2017   Procedure: CATARACT EXTRACTION PHACO AND INTRAOCULAR LENS PLACEMENT ([place]) LEFT IVA/TOPICAL;  Surgeon: [name], MD;  Location: [place];  Service: Ophthalmology;  Laterality: Left;  DIABETES - insulin sleep apnea   COLONOSCOPY WITH PROPOFOL N/A 04/22/2013   Procedure: COLONOSCOPY WITH PROPOFOL;  Surgeon: [name], MD;  Location: [facility];  Service: Endoscopy;  Laterality: N/A;   CORONARY ANGIOPLASTY WITH STENT PLACEMENT N/A 1996   CORONARY ARTERY BYPASS GRAFT N/A 08/13/2002   Procedure: 4v CORONARY ARTERY BYPASS GRAFTING; Location: [name]; Surgeon: [name], MD   CYSTOSCOPY WITH INSERTION OF UROLIFT N/A 12/25/2021   Procedure: CYSTOSCOPY WITH INSERTION OF UROLIFT;  Surgeon: [name], MD;  Location: [facility];  Service: Urology;  Laterality: N/A;   DG ANGIO AV SHUNT*L*     right and left upper arms   FASCIOTOMY  03/03/2012   Procedure: FASCIOTOMY;  Surgeon: [name], MD;  Location: [place];  Service: Orthopedics;  Laterality: Right;  FASCIOTOMY RIGHT SMALL FINGER  FASCIOTOMY Left 08/17/2013   Procedure: FASCIOTOMY LEFT RING;  Surgeon: [name], MD;  Location: [facility];  Service: Orthopedics;  Laterality: Left;   INCISION AND DRAINAGE ABSCESS Left 10/15/2015   Procedure: INCISION AND DRAINAGE ABSCESS;  Surgeon: [name], MD;  Location: [facility];  Service: General;  Laterality: Left;   KIDNEY TRANSPLANT  09/13/2010   [facility]at [facility] HEART CATH N/A 11/15/2016   Procedure: RIGHT HEART CATH;  Surgeon: [name], MD;  Location: [place] [facility];  Service: Cardiovascular;  Laterality: N/A;   RIGHT HEART CATH N/A 05/03/2019   Procedure: RIGHT HEART CATH;  Surgeon: [name], MD;  Location: [place] [facility];  Service: Cardiovascular;  Laterality: N/A;   ROUX-EN-Y GASTRIC BYPASS N/A 2015   TYMPANIC MEMBRANE REPAIR Left 03/2010   VASECTOMY     Current Outpatient Medications  Medication Sig Dispense Refill   albuterol (PROVENTIL) (2.5 MG/3ML) 0.083% nebulizer solution Take 3 mLs (2.5 mg total) by nebulization every 6 (six) hours as needed for wheezing or shortness of breath. 150 mL 0   albuterol (VENTOLIN HFA) 108 (90 Base) MCG/ACT inhaler Inhale 2 puffs into the lungs every 6 (six) hours as needed for wheezing or shortness of breath. 18 g 5   allopurinol (ZYLOPRIM) 100 MG tablet Take 100 mg by mouth daily.     apixaban (ELIQUIS) 5 MG TABS tablet TAKE 1 TABLET BY MOUTH TWICE DAILY 180 tablet 1   beclomethasone (QVAR) 80 MCG/ACT inhaler Inhale 2 puffs into the lungs as needed.     buPROPion (WELLBUTRIN SR) 150 MG 12 hr tablet TAKE 1 TABLET BY MOUTH TWICE DAILY 180 tablet 3   cholecalciferol (VITAMIN D3) 25 MCG (1000 UNIT) tablet Take 1,000 Units by mouth at bedtime.     colchicine 0.6 MG tablet Take 1 tablet (0.6 mg total) by mouth every other day 30 tablet 1   Continuous Blood Gluc Sensor (FREESTYLE LIBRE 3 SENSOR) MISC Use one every 2 weeks. 6 each 3   dapagliflozin propanediol (FARXIGA) 10 MG TABS tablet Take 1 tablet (10 mg total) by mouth daily before breakfast. 90 tablet 3   febuxostat (ULORIC) 40 MG tablet Take 3 tablets (120 mg total) by mouth once daily 90 tablet 5   fluticasone (FLONASE) 50 MCG/ACT nasal spray PLACE 2 SPRAYS INTO BOTH NOSTRILS DAILY. 16 g 11   Furosemide (FUROSCIX) 80 MG/10ML CTKT Inject 80 mg into the skin daily for 3 doses.     gabapentin (NEURONTIN) 300 MG capsule TAKE 1 CAPSULE BY MOUTH TWICE DAILY 180 capsule 3   hydrALAZINE (APRESOLINE) 25 MG tablet Take 1/2 tablet (12.5 mg total) by mouth every 8 (eight) hours. 45 tablet 1   hydrocortisone 2.5 % cream Apply topically 3 (three) times daily as needed. 30 g 3   insulin  glargine-yfgn (SEMGLEE) 100 UNIT/ML Pen Inject 35 Units subcutaneously once daily 45 mL 3   insulin lispro (HUMALOG KWIKPEN) 100 UNIT/ML KwikPen INJECT UP TO 50 UNITS UNDER THE SKIN DAILY AS DIRECTED 45 mL 3   Insulin Pen Needle (UNIFINE PENTIPS) 31G X 5 MM MISC Use 4 times daily as directed 400 each 3   iron polysaccharides (FERREX 150) 150 MG capsule TAKE 1 CAPSULE BY MOUTH 2 TIMES DAILY. 180 capsule 3   lidocaine (LIDODERM) 5 % Place 1 patch onto the skin as needed. Remove & Discard patch within 12 hours or as directed by MD     losartan (  COZAAR) 50 MG tablet Take 1 tablet (50 mg total) by mouth at bedtime. 90 tablet 3   metolazone (ZAROXOLYN) 2.5 MG tablet Take 1 tablet by mouth 3 times weekly alternating with 2 times weekly. 14 tablet 3   montelukast (SINGULAIR) 10 MG tablet TAKE 1 TABLET BY MOUTH AT BEDTIME. 90 tablet 3   Multiple Vitamin (MULTIVITAMIN WITH MINERALS) TABS tablet Take 1 tablet by mouth daily.     mycophenolate (MYFORTIC) 180 MG EC tablet Take 360 mg by mouth 2 (two) times daily.     omeprazole (PRILOSEC) 20 MG capsule TAKE 1 CAPSULE BY MOUTH DAILY. 90 capsule 3   Potassium Chloride CR (MICRO-K) 8 MEQ CPCR capsule CR Take 14 capsules (112 mEq total) by mouth 2 (two) times daily. 780 capsule 6   predniSONE (DELTASONE) 5 MG tablet Take 1 tablet (5 mg total) by mouth daily. 90 tablet 3   rosuvastatin (CRESTOR) 10 MG tablet Take 1 tablet (10 mg total) by mouth daily. 90 tablet 3   sulfamethoxazole-trimethoprim (BACTRIM) 400-80 MG tablet TAKE 1 TABLET BY MOUTH EVERY MONDAY, WEDNESDAY, FRIDAY. 36 tablet 3   tacrolimus (PROGRAF) 1 MG capsule Take 2 capsules (2 mg total) by mouth 2 times daily. 120 capsule 11   tadalafil, PAH, (ADCIRCA) 20 MG tablet Take 1 tablet (20 mg total) by mouth daily. 90 tablet 3   tamsulosin (FLOMAX) 0.4 MG CAPS capsule TAKE 1 CAPSULE BY MOUTH DAILY. 90 capsule 3   torsemide (DEMADEX) 100 MG tablet Take 1 tablet (100 mg total) by mouth 2 (two) times daily. 180  tablet 3   zinc sulfate 220 (50 Zn) MG capsule Take 220 mg by mouth daily.     Testosterone 10 MG/ACT (2%) GEL 1 pump applied to inner thigh daily- 2 pumps total (Patient not taking: Reported on 02/27/2022) 60 g 0   No current facility-administered medications for this encounter.    Allergies:   Contrast media [iodinated contrast media], Iodine, and Ibuprofen   Social History:  The patient  reports that he quit smoking about 27 years ago. His smoking use included cigars. He has never been exposed to tobacco smoke. He has never used smokeless tobacco. He reports current alcohol use. He reports that he does not use drugs.   Family History:  The patient's family history includes Breast cancer in his maternal grandmother; Diabetes in his maternal grandmother; Heart disease in his father; Kidney disease in his father; Kidney failure in his father; Liver cancer in his paternal grandmother and paternal uncle; Valvular heart disease in his mother.   ROS:  Please see the history of present illness.   All other systems are personally reviewed and negative.   Recent Labs: 11/02/2021: Magnesium 2.1 11/03/2021: Platelets 120 11/04/2021: ALT 10 12/25/2021: Hemoglobin 11.2 03/27/2022: B Natriuretic Peptide 458.6; BUN 76; Creatinine, Ser 2.44; Potassium 4.6; Sodium 139  Personally reviewed   Wt Readings from Last 3 Encounters:  03/27/22 125.6 kg (277 lb)  02/26/22 113.9 kg (251 lb)  01/24/22 116.1 kg (256 lb)    BP (!) 140/78   Pulse 73   Wt 125.6 kg (277 lb)   SpO2 95%   BMI 40.91 kg/m  General: NAD Neck: JVP 14 cm, no thyromegaly or thyroid nodule.  Lungs: Clear to auscultation bilaterally with normal respiratory effort. CV: Nondisplaced PMI.  Heart irregular S1/S2, no S3/S4, no murmur.  2+ edema to knees.  No carotid bruit.  Normal pedal pulses.  Abdomen: Soft, nontender, no hepatosplenomegaly, no distention.  Skin: Intact without lesions or rashes.  Neurologic: Alert and oriented x 3.  Psych:  Normal affect. Extremities: No clubbing or cyanosis.  HEENT: Normal.   Assessment & Plan: 1. Chronic Heart failure with Mid Range EF/ With prominent RV failure/pulmonary hypertension:  Ischemic cardiomyopathy.  ?If RV failure is related to severe OSA, he was a remote smoker. Echo in 2/21 with EF 45-50%, moderately dilated RV with moderately decreased systolic function, moderate pulmonary hypertension. Echo in 8/23 showed EF 40-45%, diffuse hypokinesis with dyssynchrony, mildly dilated RV with mildly decreased systolic function, IVC dilated, moderate AS with mean gradient 15, mild-moderate mitral stenosis with mean gradient 6.  Weight is up 25 lbs, he is volume overloaded on exam.  Suspect due to dietary indiscretion primarily, has been taking all his meds. REDS clip 43%.  - I will have him hold torsemide and metolazone for the next 3 days.  He will take Lasix 80 mg Verdon daily x 3 days (Furoscix).  After that, he will take torsemide 100 mg bid + metolazone 2.5 mg every other day x 1 week.  After that, he will take torsemide 100 mg bid and metolazone 2.5 mg three days/week.   - BMET/BNP today, BMET wks at followup.  - Continue current KCl regimen.  - Continue Farxiga 10 mg daily. - Continue hydralazine 12.5 mg tid for afterload reduction.  - He is now off ARB and spironolactone with elevated creatinine.   - No Imdur given tadalafil use for PH.  2. Atrial fibrillation: Chronic. Rate controlled.  - Continue Eliquis. No bleeding issues.  - Not on beta blocker with history of bradycardia.   3. CAD: S/p CABG 2005.  Cardiolite 12/22 with prior MI, no ischemia.  No further chest pain. High threshold for cath given CKD.  - No ASA given Eliquis use.  - Continue Crestor 10 mg daily, good lipids 2/23. 4. CKD Stage 3: S/p renal transplantation in 2012. Follow[facility]. He remains on mycophenolate and tacrolimus.   - BMET today. 5. OSA: Continue Bipap every night.  6. HTN: BP mildly  elevated today, but consistently controlled at home.  - Meds as above. No change today. 7. DM 2: On insulin. He follows with Endocrinology. - Continue dapagliflozin 8. Pulmonary hypertension: Severe pulmonary hypertension by 3/20 echo.  RHC in 2/21 with moderate, mixed pulmonary venous/pulmonary arterial hypertension (PVR 3.25 WU). Suspect group 2 and group 3 PH (OSA, ? unrecognized OHS). Also concern for possible group 1 component.  - Using BiPAP nightly. - Continue tadalafil 20 mg daily for possible group 1 component of PH.  9. Aortic stenosis: Moderate on echo 8/23.  10. Mitral stenosis: Mild to moderate on recent echo. Suspect due to calcific degeneration, not rheumatic heart disease.   Followup 2 wks with APP.  BMET at that time.   Loralie Champagne  03/27/2022

## 2022-03-27 NOTE — Patient Instructions (Signed)
START Furoscix as directed below   Your provider has order Furoscix for you. This is an on-body infuser that gives you a dose of Furosemide.   It will be shipped to your home   [employer] will call you to discuss before shipping so, PLEASE answer unknown calls  For questions regarding the device call [employer] at [phone]  Ensure you write down the time you start your infusion so that if there is a problem you will know how long the infusion lasted  Use Furoscix only AS DIRECTED by our office  Dosing Directions:   Day 1=  take 1 kit 03/27/22 pm  Day 2= take 1 kit  03/28/22 Thurs  Day 3= take 1 kit  03/29/22 Fri  HOLD TORSEMIDE AND METOLAZONE UNTIL SATURDAY 03/30/22, then start 100 mg Torsemide Twice daily And Metolazone 2.5 mg every other day for 1 week. Then take Metolazone three times a week after that. Keep your potassium dose the same.  Labs done today, your results will be available in MyChart, we will contact you for abnormal readings.  Your physician recommends that you schedule a follow-up appointment in: 2 weeks  If you have any questions or concerns before your next appointment please send [name] a message through [place] or call our office at [phone].    TO LEAVE A MESSAGE FOR THE NURSE SELECT OPTION 2, PLEASE LEAVE A MESSAGE INCLUDING: YOUR NAME DATE OF BIRTH CALL BACK NUMBER REASON FOR CALL**this is important as we prioritize the call backs  YOU WILL RECEIVE A CALL BACK THE SAME DAY AS LONG AS YOU CALL BEFORE 4:00 PM  At the [facility], you and your health needs are our priority. As part of our continuing mission to provide you with exceptional heart care, we have created designated Provider Care Teams. These Care Teams include your primary Cardiologist (physician) and Advanced Practice Providers (APPs- Physician Assistants and Nurse Practitioners) who all work together to provide you with the care you need, when you need it.    You may see any of the following providers on your designated Care Team at your next follow up: Dr Glori Bickers Dr Loralie Champagne Dr. Roxana Hires, NP Lyda Jester, [place] [facility] [place], [place] Forestine Na, NP Audry Riles, PharmD   Please be sure to bring in all your medications bottles to every appointment.

## 2022-03-27 NOTE — Progress Notes (Signed)
ReDS Vest / Clip - 03/27/22 1400       ReDS Vest / Clip   Station Marker D    Ruler Value 40    ReDS Value Range High volume overload    ReDS Actual Value 43

## 2022-03-28 ENCOUNTER — Telehealth (HOSPITAL_COMMUNITY): Payer: Self-pay

## 2022-03-28 NOTE — Telephone Encounter (Signed)
Bralynn contacted me and advised the furoscix is not working.  He states he placed it last night and urinated once during the night and today he placed it and since only urinated twice.  He states feels bloated that he is full of water.  He did states left on for 5 hours and followed the directions.  Advised him I would let the HF clinic know and they will advise or I.  He denies feeling worse, increased shortness of breath or chest pain.  Contacted Heather at HF clinic and advised her of what patient is experiencing.   [place] [phone]

## 2022-03-28 NOTE — Telephone Encounter (Signed)
Today contacted [name] when I read through his notes from HF clinic.  Asked why he did not call when he gained weight, he said his scale broke and was not weighing.  He states bought a new scale and is working fine.  He states was feeling ok, knew he had over eaten but could not tell he had fluid on him.  He did do the furoscix last night and had no problems with it, he states did not urinate much, he is doing again this morning and tomorrow.   Went over his medications and he is aware of what to hold and restart.  Made a home visit appt for next week.  Will visit for heart failure, diet and medication management.   [place] [phone]

## 2022-03-28 NOTE — Telephone Encounter (Signed)
Spoke with patient in regards to his Furoscix infusions kit. Has stated its infusion properly and he has gone to the bathroom at least 3 times today and went about 3 times yesterday. Advise him that if [name] wants his treatment plan to change that I will call him

## 2022-03-29 ENCOUNTER — Other Ambulatory Visit: Payer: Medicare Other

## 2022-03-29 DIAGNOSIS — N184 Chronic kidney disease, stage 4 (severe): Secondary | ICD-10-CM | POA: Diagnosis not present

## 2022-03-29 DIAGNOSIS — D631 Anemia in chronic kidney disease: Secondary | ICD-10-CM | POA: Diagnosis not present

## 2022-03-30 DIAGNOSIS — E1165 Type 2 diabetes mellitus with hyperglycemia: Secondary | ICD-10-CM | POA: Diagnosis not present

## 2022-04-01 ENCOUNTER — Encounter (HOSPITAL_COMMUNITY): Payer: Self-pay | Admitting: Cardiology

## 2022-04-01 ENCOUNTER — Telehealth: Payer: Self-pay | Admitting: *Deleted

## 2022-04-01 ENCOUNTER — Ambulatory Visit (HOSPITAL_COMMUNITY)
Admission: RE | Admit: 2022-04-01 | Discharge: 2022-04-01 | Disposition: A | Discharge status: Home / Self Care | Payer: Commercial Managed Care - PPO | Source: Ambulatory Visit | Attending: Family Medicine | Admitting: Family Medicine

## 2022-04-01 ENCOUNTER — Other Ambulatory Visit: Payer: Self-pay

## 2022-04-01 ENCOUNTER — Encounter (HOSPITAL_COMMUNITY): Payer: Self-pay

## 2022-04-01 ENCOUNTER — Inpatient Hospital Stay (HOSPITAL_COMMUNITY)
Admit: 2022-04-01 | Discharge: 2022-04-06 | DRG: 286 | Disposition: A | Discharge status: Home / Self Care | Payer: Commercial Managed Care - PPO | Source: Ambulatory Visit | Attending: Cardiology | Admitting: Cardiology

## 2022-04-01 ENCOUNTER — Other Ambulatory Visit (HOSPITAL_COMMUNITY): Payer: Self-pay

## 2022-04-01 VITALS — BP 132/90 | HR 70 | Wt 281.2 lb

## 2022-04-01 DIAGNOSIS — I5042 Chronic combined systolic (congestive) and diastolic (congestive) heart failure: Secondary | ICD-10-CM

## 2022-04-01 DIAGNOSIS — E1122 Type 2 diabetes mellitus with diabetic chronic kidney disease: Secondary | ICD-10-CM | POA: Diagnosis not present

## 2022-04-01 DIAGNOSIS — E1121 Type 2 diabetes mellitus with diabetic nephropathy: Secondary | ICD-10-CM

## 2022-04-01 DIAGNOSIS — I5043 Acute on chronic combined systolic (congestive) and diastolic (congestive) heart failure: Secondary | ICD-10-CM | POA: Diagnosis present

## 2022-04-01 DIAGNOSIS — I5032 Chronic diastolic (congestive) heart failure: Secondary | ICD-10-CM | POA: Diagnosis present

## 2022-04-01 DIAGNOSIS — N1832 Chronic kidney disease, stage 3b: Secondary | ICD-10-CM

## 2022-04-01 DIAGNOSIS — Z940 Kidney transplant status: Secondary | ICD-10-CM

## 2022-04-01 DIAGNOSIS — Z951 Presence of aortocoronary bypass graft: Secondary | ICD-10-CM | POA: Diagnosis not present

## 2022-04-01 DIAGNOSIS — Z79621 Long term (current) use of calcineurin inhibitor: Secondary | ICD-10-CM | POA: Diagnosis not present

## 2022-04-01 DIAGNOSIS — K219 Gastro-esophageal reflux disease without esophagitis: Secondary | ICD-10-CM | POA: Diagnosis present

## 2022-04-01 DIAGNOSIS — F329 Major depressive disorder, single episode, unspecified: Secondary | ICD-10-CM | POA: Diagnosis present

## 2022-04-01 DIAGNOSIS — I2510 Atherosclerotic heart disease of native coronary artery without angina pectoris: Secondary | ICD-10-CM | POA: Diagnosis not present

## 2022-04-01 DIAGNOSIS — G4733 Obstructive sleep apnea (adult) (pediatric): Secondary | ICD-10-CM

## 2022-04-01 DIAGNOSIS — I872 Venous insufficiency (chronic) (peripheral): Secondary | ICD-10-CM | POA: Diagnosis present

## 2022-04-01 DIAGNOSIS — Z955 Presence of coronary angioplasty implant and graft: Secondary | ICD-10-CM

## 2022-04-01 DIAGNOSIS — I2721 Secondary pulmonary arterial hypertension: Secondary | ICD-10-CM | POA: Diagnosis not present

## 2022-04-01 DIAGNOSIS — M069 Rheumatoid arthritis, unspecified: Secondary | ICD-10-CM | POA: Diagnosis present

## 2022-04-01 DIAGNOSIS — Z7901 Long term (current) use of anticoagulants: Secondary | ICD-10-CM

## 2022-04-01 DIAGNOSIS — Z888 Allergy status to other drugs, medicaments and biological substances status: Secondary | ICD-10-CM

## 2022-04-01 DIAGNOSIS — I445 Left posterior fascicular block: Secondary | ICD-10-CM | POA: Insufficient documentation

## 2022-04-01 DIAGNOSIS — Z8616 Personal history of COVID-19: Secondary | ICD-10-CM | POA: Diagnosis not present

## 2022-04-01 DIAGNOSIS — Z9884 Bariatric surgery status: Secondary | ICD-10-CM

## 2022-04-01 DIAGNOSIS — I350 Nonrheumatic aortic (valve) stenosis: Secondary | ICD-10-CM

## 2022-04-01 DIAGNOSIS — Z79899 Other long term (current) drug therapy: Secondary | ICD-10-CM | POA: Diagnosis not present

## 2022-04-01 DIAGNOSIS — I3481 Nonrheumatic mitral (valve) annulus calcification: Secondary | ICD-10-CM | POA: Diagnosis present

## 2022-04-01 DIAGNOSIS — E876 Hypokalemia: Secondary | ICD-10-CM

## 2022-04-01 DIAGNOSIS — Z79624 Long term (current) use of inhibitors of nucleotide synthesis: Secondary | ICD-10-CM | POA: Insufficient documentation

## 2022-04-01 DIAGNOSIS — E785 Hyperlipidemia, unspecified: Secondary | ICD-10-CM | POA: Diagnosis present

## 2022-04-01 DIAGNOSIS — I700 Atherosclerosis of aorta: Secondary | ICD-10-CM | POA: Diagnosis present

## 2022-04-01 DIAGNOSIS — Z7952 Long term (current) use of systemic steroids: Secondary | ICD-10-CM

## 2022-04-01 DIAGNOSIS — Z7984 Long term (current) use of oral hypoglycemic drugs: Secondary | ICD-10-CM | POA: Diagnosis not present

## 2022-04-01 DIAGNOSIS — Z87891 Personal history of nicotine dependence: Secondary | ICD-10-CM | POA: Diagnosis not present

## 2022-04-01 DIAGNOSIS — I130 Hypertensive heart and chronic kidney disease with heart failure and stage 1 through stage 4 chronic kidney disease, or unspecified chronic kidney disease: Secondary | ICD-10-CM | POA: Insufficient documentation

## 2022-04-01 DIAGNOSIS — I255 Ischemic cardiomyopathy: Secondary | ICD-10-CM | POA: Insufficient documentation

## 2022-04-01 DIAGNOSIS — I272 Pulmonary hypertension, unspecified: Secondary | ICD-10-CM

## 2022-04-01 DIAGNOSIS — D631 Anemia in chronic kidney disease: Secondary | ICD-10-CM | POA: Diagnosis present

## 2022-04-01 DIAGNOSIS — N183 Chronic kidney disease, stage 3 unspecified: Secondary | ICD-10-CM | POA: Insufficient documentation

## 2022-04-01 DIAGNOSIS — I050 Rheumatic mitral stenosis: Secondary | ICD-10-CM

## 2022-04-01 DIAGNOSIS — Z8249 Family history of ischemic heart disease and other diseases of the circulatory system: Secondary | ICD-10-CM

## 2022-04-01 DIAGNOSIS — N138 Other obstructive and reflux uropathy: Secondary | ICD-10-CM | POA: Diagnosis present

## 2022-04-01 DIAGNOSIS — I5023 Acute on chronic systolic (congestive) heart failure: Secondary | ICD-10-CM

## 2022-04-01 DIAGNOSIS — Z794 Long term (current) use of insulin: Secondary | ICD-10-CM | POA: Insufficient documentation

## 2022-04-01 DIAGNOSIS — I342 Nonrheumatic mitral (valve) stenosis: Secondary | ICD-10-CM | POA: Diagnosis present

## 2022-04-01 DIAGNOSIS — I482 Chronic atrial fibrillation, unspecified: Secondary | ICD-10-CM | POA: Diagnosis not present

## 2022-04-01 DIAGNOSIS — M109 Gout, unspecified: Secondary | ICD-10-CM | POA: Diagnosis present

## 2022-04-01 DIAGNOSIS — N401 Enlarged prostate with lower urinary tract symptoms: Secondary | ICD-10-CM | POA: Diagnosis present

## 2022-04-01 DIAGNOSIS — I252 Old myocardial infarction: Secondary | ICD-10-CM | POA: Insufficient documentation

## 2022-04-01 DIAGNOSIS — Z91041 Radiographic dye allergy status: Secondary | ICD-10-CM

## 2022-04-01 DIAGNOSIS — Z6841 Body Mass Index (BMI) 40.0 and over, adult: Secondary | ICD-10-CM

## 2022-04-01 DIAGNOSIS — Z79890 Hormone replacement therapy (postmenopausal): Secondary | ICD-10-CM

## 2022-04-01 DIAGNOSIS — Z961 Presence of intraocular lens: Secondary | ICD-10-CM | POA: Diagnosis present

## 2022-04-01 DIAGNOSIS — Z886 Allergy status to analgesic agent status: Secondary | ICD-10-CM

## 2022-04-01 DIAGNOSIS — I509 Heart failure, unspecified: Secondary | ICD-10-CM | POA: Diagnosis not present

## 2022-04-01 DIAGNOSIS — I10 Essential (primary) hypertension: Secondary | ICD-10-CM

## 2022-04-01 LAB — CBC
HCT: 32.8 % — ABNORMAL LOW (ref 39.0–52.0)
Hemoglobin: 9.1 g/dL — ABNORMAL LOW (ref 13.0–17.0)
MCH: 20.4 pg — ABNORMAL LOW (ref 26.0–34.0)
MCHC: 27.7 g/dL — ABNORMAL LOW (ref 30.0–36.0)
MCV: 73.7 fL — ABNORMAL LOW (ref 80.0–100.0)
Platelets: 116 10*3/uL — ABNORMAL LOW (ref 150–400)
RBC: 4.45 MIL/uL (ref 4.22–5.81)
RDW: 19.6 % — ABNORMAL HIGH (ref 11.5–15.5)
WBC: 4.6 10*3/uL (ref 4.0–10.5)
nRBC: 0 % (ref 0.0–0.2)

## 2022-04-01 LAB — COMPREHENSIVE METABOLIC PANEL
ALT: 16 U/L (ref 0–44)
AST: 21 U/L (ref 15–41)
Albumin: 3.4 g/dL — ABNORMAL LOW (ref 3.5–5.0)
Alkaline Phosphatase: 95 U/L (ref 38–126)
Anion gap: 12 (ref 5–15)
BUN: 75 mg/dL — ABNORMAL HIGH (ref 8–23)
CO2: 32 mmol/L (ref 22–32)
Calcium: 8.9 mg/dL (ref 8.9–10.3)
Chloride: 95 mmol/L — ABNORMAL LOW (ref 98–111)
Creatinine, Ser: 2.3 mg/dL — ABNORMAL HIGH (ref 0.61–1.24)
GFR, Estimated: 31 mL/min — ABNORMAL LOW (ref >60)
Glucose, Bld: 119 mg/dL — ABNORMAL HIGH (ref 70–99)
Potassium: 3.8 mmol/L (ref 3.5–5.1)
Sodium: 139 mmol/L (ref 135–145)
Total Bilirubin: 0.6 mg/dL (ref 0.3–1.2)
Total Protein: 6.6 g/dL (ref 6.5–8.1)

## 2022-04-01 LAB — TSH: TSH: 2.828 u[IU]/mL (ref 0.350–4.500)

## 2022-04-01 LAB — LACTIC ACID, PLASMA: Lactic Acid, Venous: 1.7 mmol/L (ref 0.5–1.9)

## 2022-04-01 LAB — GLUCOSE, CAPILLARY
Glucose-Capillary: 123 mg/dL — ABNORMAL HIGH (ref 70–99)
Glucose-Capillary: 239 mg/dL — ABNORMAL HIGH (ref 70–99)

## 2022-04-01 LAB — BRAIN NATRIURETIC PEPTIDE: B Natriuretic Peptide: 419.8 pg/mL — ABNORMAL HIGH (ref 0.0–100.0)

## 2022-04-01 MED ORDER — SODIUM CHLORIDE 0.9 % IV SOLN: 250.0000 mL | INTRAVENOUS | Status: DC | PRN

## 2022-04-01 MED ORDER — SODIUM CHLORIDE 0.9% FLUSH: 3.0000 mL | INTRAVENOUS | Status: DC | PRN

## 2022-04-01 MED ORDER — BUPROPION HCL ER (SR) 150 MG PO TB12
150.0000 mg | ORAL_TABLET | Freq: Two times a day (BID) | ORAL | Status: DC
  Administered 2022-04-01 – 2022-04-06 (×10): 150 mg via ORAL
  Filled 2022-04-01 (×12): qty 1

## 2022-04-01 MED ORDER — MELATONIN 3 MG PO TABS
3.0000 mg | ORAL_TABLET | Freq: Every evening | ORAL | Status: DC | PRN
  Administered 2022-04-01 – 2022-04-05 (×5): 3 mg via ORAL
  Filled 2022-04-01 (×5): qty 1

## 2022-04-01 MED ORDER — PREDNISONE 5 MG PO TABS
5.0000 mg | ORAL_TABLET | Freq: Every day | ORAL | Status: DC
  Administered 2022-04-02 – 2022-04-06 (×5): 5 mg via ORAL
  Filled 2022-04-01 (×5): qty 1

## 2022-04-01 MED ORDER — COLCHICINE 0.6 MG PO TABS
0.6000 mg | ORAL_TABLET | Freq: Every day | ORAL | Status: DC | PRN
  Administered 2022-04-03: 0.6 mg via ORAL
  Filled 2022-04-01: qty 1

## 2022-04-01 MED ORDER — APIXABAN 5 MG PO TABS
5.0000 mg | ORAL_TABLET | Freq: Two times a day (BID) | ORAL | Status: DC
  Administered 2022-04-01 – 2022-04-04 (×6): 5 mg via ORAL
  Filled 2022-04-01 (×6): qty 1

## 2022-04-01 MED ORDER — GABAPENTIN 300 MG PO CAPS
300.0000 mg | ORAL_CAPSULE | Freq: Two times a day (BID) | ORAL | Status: DC
  Administered 2022-04-01 – 2022-04-06 (×10): 300 mg via ORAL
  Filled 2022-04-01 (×10): qty 1

## 2022-04-01 MED ORDER — SULFAMETHOXAZOLE-TRIMETHOPRIM 400-80 MG PO TABS
1.0000 | ORAL_TABLET | ORAL | Status: DC
  Administered 2022-04-03 – 2022-04-05 (×2): 1 via ORAL
  Filled 2022-04-01 (×2): qty 1

## 2022-04-01 MED ORDER — SODIUM CHLORIDE 0.9% FLUSH
3.0000 mL | Freq: Two times a day (BID) | INTRAVENOUS | Status: DC
  Administered 2022-04-01 – 2022-04-06 (×10): 3 mL via INTRAVENOUS

## 2022-04-01 MED ORDER — INSULIN ASPART 100 UNIT/ML IJ SOLN
0.0000 [IU] | Freq: Three times a day (TID) | INTRAMUSCULAR | Status: DC
  Administered 2022-04-01: 2 [IU] via SUBCUTANEOUS
  Administered 2022-04-02 (×2): 5 [IU] via SUBCUTANEOUS
  Administered 2022-04-02: 2 [IU] via SUBCUTANEOUS
  Administered 2022-04-03: 5 [IU] via SUBCUTANEOUS
  Administered 2022-04-03 (×2): 3 [IU] via SUBCUTANEOUS
  Administered 2022-04-04: 5 [IU] via SUBCUTANEOUS
  Administered 2022-04-04 – 2022-04-05 (×3): 3 [IU] via SUBCUTANEOUS
  Administered 2022-04-05: 5 [IU] via SUBCUTANEOUS
  Administered 2022-04-05: 11 [IU] via SUBCUTANEOUS
  Administered 2022-04-06: 8 [IU] via SUBCUTANEOUS
  Administered 2022-04-06: 5 [IU] via SUBCUTANEOUS

## 2022-04-01 MED ORDER — POTASSIUM CHLORIDE ER 8 MEQ PO CPCR: 110.0000 meq | ORAL_CAPSULE | Freq: Two times a day (BID) | ORAL | Status: DC

## 2022-04-01 MED ORDER — ALBUTEROL SULFATE (2.5 MG/3ML) 0.083% IN NEBU: 2.5000 mg | INHALATION_SOLUTION | Freq: Four times a day (QID) | RESPIRATORY_TRACT | Status: DC | PRN

## 2022-04-01 MED ORDER — FUROSEMIDE 10 MG/ML IJ SOLN
120.0000 mg | Freq: Once | INTRAVENOUS | Status: AC
  Administered 2022-04-01: 120 mg via INTRAVENOUS
  Filled 2022-04-01: qty 10

## 2022-04-01 MED ORDER — ACETAMINOPHEN 325 MG PO TABS
650.0000 mg | ORAL_TABLET | ORAL | Status: DC | PRN
  Administered 2022-04-03: 650 mg via ORAL
  Filled 2022-04-01: qty 2

## 2022-04-01 MED ORDER — ROSUVASTATIN CALCIUM 5 MG PO TABS
10.0000 mg | ORAL_TABLET | Freq: Every day | ORAL | Status: DC
  Administered 2022-04-02 – 2022-04-06 (×5): 10 mg via ORAL
  Filled 2022-04-01 (×5): qty 2

## 2022-04-01 MED ORDER — DAPAGLIFLOZIN PROPANEDIOL 10 MG PO TABS
10.0000 mg | ORAL_TABLET | Freq: Every day | ORAL | Status: DC
  Administered 2022-04-02 – 2022-04-06 (×5): 10 mg via ORAL
  Filled 2022-04-01 (×5): qty 1

## 2022-04-01 MED ORDER — TACROLIMUS 1 MG PO CAPS
2.0000 mg | ORAL_CAPSULE | Freq: Two times a day (BID) | ORAL | Status: DC
  Administered 2022-04-01 – 2022-04-06 (×10): 2 mg via ORAL
  Filled 2022-04-01 (×12): qty 2

## 2022-04-01 MED ORDER — COLCHICINE 0.6 MG PO TABS: 0.6000 mg | ORAL_TABLET | ORAL | Status: DC

## 2022-04-01 MED ORDER — FEBUXOSTAT 40 MG PO TABS
120.0000 mg | ORAL_TABLET | Freq: Every day | ORAL | Status: DC
  Administered 2022-04-02 – 2022-04-06 (×5): 120 mg via ORAL
  Filled 2022-04-01 (×5): qty 3

## 2022-04-01 MED ORDER — MONTELUKAST SODIUM 10 MG PO TABS
10.0000 mg | ORAL_TABLET | Freq: Every day | ORAL | Status: DC
  Administered 2022-04-01 – 2022-04-05 (×5): 10 mg via ORAL
  Filled 2022-04-01 (×5): qty 1

## 2022-04-01 MED ORDER — PANTOPRAZOLE SODIUM 40 MG PO TBEC
40.0000 mg | DELAYED_RELEASE_TABLET | Freq: Every day | ORAL | Status: DC
  Administered 2022-04-02 – 2022-04-06 (×5): 40 mg via ORAL
  Filled 2022-04-01 (×5): qty 1

## 2022-04-01 MED ORDER — HYDRALAZINE HCL 25 MG PO TABS
12.5000 mg | ORAL_TABLET | Freq: Three times a day (TID) | ORAL | Status: DC
  Administered 2022-04-01 – 2022-04-05 (×11): 12.5 mg via ORAL
  Filled 2022-04-01 (×11): qty 1

## 2022-04-01 MED ORDER — MYCOPHENOLATE SODIUM 180 MG PO TBEC
360.0000 mg | DELAYED_RELEASE_TABLET | Freq: Two times a day (BID) | ORAL | Status: DC
  Administered 2022-04-01 – 2022-04-06 (×10): 360 mg via ORAL
  Filled 2022-04-01 (×12): qty 2

## 2022-04-01 MED ORDER — TAMSULOSIN HCL 0.4 MG PO CAPS
0.4000 mg | ORAL_CAPSULE | Freq: Every day | ORAL | Status: DC
  Administered 2022-04-02 – 2022-04-06 (×5): 0.4 mg via ORAL
  Filled 2022-04-01 (×5): qty 1

## 2022-04-01 MED ORDER — ALLOPURINOL 100 MG PO TABS
100.0000 mg | ORAL_TABLET | Freq: Every day | ORAL | Status: DC
  Administered 2022-04-02 – 2022-04-06 (×5): 100 mg via ORAL
  Filled 2022-04-01 (×5): qty 1

## 2022-04-01 MED ORDER — METOLAZONE 5 MG PO TABS
5.0000 mg | ORAL_TABLET | Freq: Once | ORAL | Status: AC
  Administered 2022-04-01: 5 mg via ORAL
  Filled 2022-04-01: qty 1

## 2022-04-01 NOTE — Telephone Encounter (Signed)
Pt aware and appt sch for 2 pm today.

## 2022-04-01 NOTE — Progress Notes (Signed)
ReDS Vest / Clip - 04/01/22 1500       ReDS Vest / Clip   Station Marker D    Ruler Value 40    ReDS Value Range High volume overload    ReDS Actual Value 42    Anatomical Comments sitting

## 2022-04-01 NOTE — Progress Notes (Signed)
Report called to [place] [phone] pt going to bed 03. Pt will be taken to unit by RN staff.

## 2022-04-01 NOTE — Progress Notes (Signed)
Advanced Heart Failure Clinic Note   Date:  04/01/2022   ID:  Ananda Sitzer., DOB 1960/04/02, MRN [id number]   Provider location: [address], [place] Type of Visit:  Established patient  PCP:  [name], MD  Cardiologist:  [name] Nephrology: Dr. Ronnald Ramp  HF Cardiologist: [name]  HPI: Jeferson Kreig Parson. is a 62 y.o. male  with a history of CAD s/p CABG 2005, chronic atrial fibrillation on Eliquis, ESRD s/p renal transplant 2012, anc chronic diastolic CHF.  His renal function has been fairly stable, most recent creatinine 1.43.  He has been struggling with volume overload/CHF.  He has a history of ischemic cardiomyopathy with EF as low as 35-40% in the past but echo in 3/20 showed EF 60-65%.  There is evidence for RV failure with pulmonary hypertension by echo.    Admitted 11/05/18 low grade fever and shortness of breath. HS Trop negative. Covid 19 negative. Blood CX negative. Placed on antibiotics and discharged to home the next day.   Admitted 04/30/92 with A/C Diastolic HF. Diuresed with IV lasix and transitioned to torsemide 80/40 mg . Discharge weight 252 pounds.   Admitted 06/2019 with left arm pain. Left upper extremity venous Doppler revealed occluded cephalic vein at the AV fistula outflow tract in the antecubital fossa with no other thrombus identified. Vascular evaluated. He continued on Eliquis.  No plan for intervention. Also treated for acute gout flare. Discharged on prednisone taper.     Given IV lasix on 12/21 by HF Paramedicine.   Admitted to [facility] 2/22 with acute gout flare vs cellulitis. Nephrology was consulted to evaluate transplant regimen and follow along. He was treated with antibiotics and prednisone. Discharged on decreased torsemide dose of 40 mg daily. Weight 259 lbs.  Admitted 4/22 for left leg cellulitis after sustaining a dog scratch, received IV antibiotics. No changes to his diuretic therapy. Discharge weight 266  lbs.  Follow up with [name], 1/23, he had CP and underwent Lexiscan myoview, which overall was a low risk study with evidence of prior infarct in the infero-apical area with no significant change from before. Losartan added to GDMT regimen.  Follow up 5/23, volume overloaded, REDs 40%. Advised taking additional metolazone (3 doses that week), however labs showed worsening renal function and advised to keep at 2 doses.  Admitted in 5/23 with acute on chronic CHF and AKI.  Gareth Eagle and losartan stopped. Diuresed with IV lasix. Echo showed EF 40-45%, RV mildly reduced, GIIIDD (restrictive), IVC dilated. Nephrology consulted for diuretic dosing. Discharged home, weight 266 lbs.  He had chest pain in 8/23 and was admitted at [facility], minimal HS-TnI elevation with no trend, thought to be demand ischemia from volume overload.    Echo 8/23 showing EF 40-45%, diffuse hypokinesis with dyssynchrony, mildly dilated RV with mildly decreased systolic function, IVC dilated, moderate AS with mean gradient 15, mild-moderate mitral stenosis with mean gradient 6.   Follow up 03/27/22, volume overloaded with 25 lbs weight gain. Instructed to use Furoscix x 3 days, then resume home dose of torsemide + metolazone. Clinic weight 277 lbs.  Today he returns for an acute visit with worsening dyspnea and poor urinary output after using Furoscix x 3 days last week. He is SOB walking on flat ground and has chest pressure. Has early satiety, legs and belly are swelling. He has wheezing laying flat. Denies palpitations, abnormal bleeding, orthopnea. Appetite poor. No fever or chills. Weight  at home 272 pounds. Taking all medications.   ECG (personally reviewed): AF, IVCD QRS 124 msec, 71 bpm  REDs: 42%  Labs (7/19): LDL 49 Labs (2/20): K 3.7, creatinine 1.43 Labs (4/20): K 4.3, creatinine 1.24 Labs (11/06/18): K 3.7 creatinine 1.23  Labs (05/07/19): K 3.8 Creatinine 1.5  Labs (07/18/19): K 4.3 Creatinine 1.42  Labs  (5/21): K 3.3, creatinine 1.21 Labs (11/21): K 3.6, creatinine 1.4 Labs (12/21): K 3.3, creatinine 2.4, hgb 12.8 Labs (04/01/19): K 3.3 Creatinine 2.2  Labs (8/22): K 4.0, creatinine 1.6, HDL 35, LDL 36, a1c 7.5 Labs (2/23): LDL 31, HDL 36 Labs (4/23): K 3.3, creatinine 1.68 Labs (5/23): K 4.1, creatinine 2.24 Labs (8/23): K 3.5, creatinine 2.36 Labs (12/23): K 3.6, creatinine 1.99, hgb 9.3 Labs (1/24): K 4.6, creatinine 2.44  PMH: 1. CAD: s/p CABG x 4 in [place] in 2005.  - Cardiolite (1/20): EF 47%, small fixed apical septal defect, no ischemia.  Low risk study.  - Cardiolite (12/22): small apical inferior defect, no changes from prior, no ischemia. Low risk study. 2. Asthma 3. Gout 4. Atrial fibrillation: Chronic.  5. ESRD s/p renal transplantation at [name] in 2012.  6. Anemia of chronic disease 7. Type II diabetes 8. HTN 9. Hyperlipidemia 10. OSA: On bipap.  11. Ischemic cardiomyopathy: EF 35-40% in past.  - Cardiolite (1/20) with EF 47%.  - Echo (1/20): EF 50-55%, mild MR, RV moderately dilated with moderately decreased systolic function, PASP 73 mmHg, moderate TR.   - Echo (3/20): EF 60-65%, PASP 75 mmHg, mildly dilated RV with mildly decreased systolic function.  - Echo (2/21): EF 45-50%, global HK, moderately dilated RV with moderately decreased systolic function, D-shaped interventricular septum, dilated IVC.  - RHC (2/21): mean RA 19, PA 78/31 mean 51, mean PCWP 26, CI 3.26, PVR 3.25 - Echo (5/23): EF 40-45%, RV mildly reduced, GIIIDD (restrictive) - Echo (8/23): EF 40-45%, diffuse hypokinesis with dyssynchrony, mildly dilated RV with mildly decreased systolic function, IVC dilated, moderate AS with mean gradient 15, mild-moderate mitral stenosis with mean gradient 6.  12. Obesity: s/p gastric bypass in 2015.  13. Bradycardia with beta blocker.  14. Aortic stenosis: Moderate on 8/23 echo.  15. Mitral stenosis: Mild to moderate on 8/23 echo.  16. Rheumatoid  arthritis  Past Surgical History:  Procedure Laterality Date   CARDIAC CATHETERIZATION N/A 08/10/2002   CATARACT EXTRACTION W/PHACO Right 10/29/2017   Procedure: CATARACT EXTRACTION PHACO AND INTRAOCULAR LENS PLACEMENT ([place])  RIGHT DIABETIC;  Surgeon: [name], MD;  Location: [place];  Service: Ophthalmology;  Laterality: Right;  Diabetic - insulin   CATARACT EXTRACTION W/PHACO Left 11/18/2017   Procedure: CATARACT EXTRACTION PHACO AND INTRAOCULAR LENS PLACEMENT ([place]) LEFT IVA/TOPICAL;  Surgeon: [name], MD;  Location: [place];  Service: Ophthalmology;  Laterality: Left;  DIABETES - insulin sleep apnea   COLONOSCOPY WITH PROPOFOL N/A 04/22/2013   Procedure: COLONOSCOPY WITH PROPOFOL;  Surgeon: [name], MD;  Location: [facility];  Service: Endoscopy;  Laterality: N/A;   CORONARY ANGIOPLASTY WITH STENT PLACEMENT N/A 1996   CORONARY ARTERY BYPASS GRAFT N/A 08/13/2002   Procedure: 4v CORONARY ARTERY BYPASS GRAFTING; Location: [name]; Surgeon: [name], MD   CYSTOSCOPY WITH INSERTION OF UROLIFT N/A 12/25/2021   Procedure: CYSTOSCOPY WITH INSERTION OF UROLIFT;  Surgeon: [name], MD;  Location: [facility];  Service: Urology;  Laterality: N/A;   DG ANGIO AV SHUNT*L*     right and left upper arms  FASCIOTOMY  03/03/2012   Procedure: FASCIOTOMY;  Surgeon: [name], MD;  Location: [place];  Service: Orthopedics;  Laterality: Right;  FASCIOTOMY RIGHT SMALL FINGER   FASCIOTOMY Left 08/17/2013   Procedure: FASCIOTOMY LEFT RING;  Surgeon: [name], MD;  Location: [place];  Service: Orthopedics;  Laterality: Left;   INCISION AND DRAINAGE ABSCESS Left 10/15/2015   Procedure: INCISION AND DRAINAGE ABSCESS;  Surgeon: [name], MD;  Location: [facility];  Service: General;  Laterality: Left;   KIDNEY TRANSPLANT  09/13/2010   cadaver--at [facility] HEART CATH N/A 11/15/2016    Procedure: RIGHT HEART CATH;  Surgeon: [name], MD;  Location: [place] [facility];  Service: Cardiovascular;  Laterality: N/A;   RIGHT HEART CATH N/A 05/03/2019   Procedure: RIGHT HEART CATH;  Surgeon: [name], MD;  Location: [place] [facility];  Service: Cardiovascular;  Laterality: N/A;   ROUX-EN-Y GASTRIC BYPASS N/A 2015   TYMPANIC MEMBRANE REPAIR Left 03/2010   VASECTOMY     Current Outpatient Medications  Medication Sig Dispense Refill   albuterol (PROVENTIL) (2.5 MG/3ML) 0.083% nebulizer solution Take 3 mLs (2.5 mg total) by nebulization every 6 (six) hours as needed for wheezing or shortness of breath. 150 mL 0   albuterol (VENTOLIN HFA) 108 (90 Base) MCG/ACT inhaler Inhale 2 puffs into the lungs every 6 (six) hours as needed for wheezing or shortness of breath. 18 g 5   allopurinol (ZYLOPRIM) 100 MG tablet Take 100 mg by mouth daily.     apixaban (ELIQUIS) 5 MG TABS tablet TAKE 1 TABLET BY MOUTH TWICE DAILY 180 tablet 1   beclomethasone (QVAR) 80 MCG/ACT inhaler Inhale 2 puffs into the lungs as needed.     buPROPion (WELLBUTRIN SR) 150 MG 12 hr tablet TAKE 1 TABLET BY MOUTH TWICE DAILY 180 tablet 3   cholecalciferol (VITAMIN D3) 25 MCG (1000 UNIT) tablet Take 1,000 Units by mouth at bedtime.     colchicine 0.6 MG tablet Take 1 tablet (0.6 mg total) by mouth every other day 30 tablet 1   Continuous Blood Gluc Sensor (FREESTYLE LIBRE 3 SENSOR) MISC Use one every 2 weeks. 6 each 3   dapagliflozin propanediol (FARXIGA) 10 MG TABS tablet Take 1 tablet (10 mg total) by mouth daily before breakfast. 90 tablet 3   febuxostat (ULORIC) 40 MG tablet Take 3 tablets (120 mg total) by mouth once daily 90 tablet 5   fluticasone (FLONASE) 50 MCG/ACT nasal spray PLACE 2 SPRAYS INTO BOTH NOSTRILS DAILY. 16 g 11   gabapentin (NEURONTIN) 300 MG capsule TAKE 1 CAPSULE BY MOUTH TWICE DAILY 180 capsule 3   hydrALAZINE (APRESOLINE) 25 MG tablet Take 1/2 tablet (12.5 mg total) by mouth  every 8 (eight) hours. 45 tablet 1   hydrocortisone 2.5 % cream Apply topically 3 (three) times daily as needed. 30 g 3   insulin glargine-yfgn (SEMGLEE) 100 UNIT/ML Pen Inject 35 Units subcutaneously once daily 45 mL 3   insulin lispro (HUMALOG KWIKPEN) 100 UNIT/ML KwikPen INJECT UP TO 50 UNITS UNDER THE SKIN DAILY AS DIRECTED 45 mL 3   Insulin Pen Needle (UNIFINE PENTIPS) 31G X 5 MM MISC Use 4 times daily as directed 400 each 3   iron polysaccharides (FERREX 150) 150 MG capsule TAKE 1 CAPSULE BY MOUTH 2 TIMES DAILY. 180 capsule 3   lidocaine (LIDODERM) 5 % Place 1 patch onto the skin as needed. Remove & Discard patch  within 12 hours or as directed by MD     metolazone (ZAROXOLYN) 2.5 MG tablet Take 1 tablet by mouth 3 times weekly alternating with 2 times weekly. 14 tablet 3   montelukast (SINGULAIR) 10 MG tablet TAKE 1 TABLET BY MOUTH AT BEDTIME. 90 tablet 3   Multiple Vitamin (MULTIVITAMIN WITH MINERALS) TABS tablet Take 1 tablet by mouth daily.     mycophenolate (MYFORTIC) 180 MG EC tablet Take 360 mg by mouth 2 (two) times daily.     omeprazole (PRILOSEC) 20 MG capsule TAKE 1 CAPSULE BY MOUTH DAILY. 90 capsule 3   Potassium Chloride CR (MICRO-K) 8 MEQ CPCR capsule CR Take 14 capsules (112 mEq total) by mouth 2 (two) times daily. 780 capsule 6   predniSONE (DELTASONE) 5 MG tablet Take 1 tablet (5 mg total) by mouth daily. 90 tablet 3   rosuvastatin (CRESTOR) 10 MG tablet Take 1 tablet (10 mg total) by mouth daily. 90 tablet 3   sulfamethoxazole-trimethoprim (BACTRIM) 400-80 MG tablet TAKE 1 TABLET BY MOUTH EVERY MONDAY, WEDNESDAY, FRIDAY. 36 tablet 3   tacrolimus (PROGRAF) 1 MG capsule Take 2 capsules (2 mg total) by mouth 2 times daily. 120 capsule 11   tadalafil, PAH, (ADCIRCA) 20 MG tablet Take 1 tablet (20 mg total) by mouth daily. 90 tablet 3   tamsulosin (FLOMAX) 0.4 MG CAPS capsule TAKE 1 CAPSULE BY MOUTH DAILY. 90 capsule 3   Testosterone 10 MG/ACT (2%) GEL 1 pump applied to inner  thigh daily- 2 pumps total 60 g 0   torsemide (DEMADEX) 100 MG tablet Take 1 tablet (100 mg total) by mouth 2 (two) times daily. 180 tablet 3   zinc sulfate 220 (50 Zn) MG capsule Take 220 mg by mouth daily.     Furosemide (FUROSCIX) 80 MG/10ML CTKT Inject 80 mg into the skin daily for 3 doses.     losartan (COZAAR) 50 MG tablet Take 1 tablet (50 mg total) by mouth at bedtime. (Patient not taking: Reported on 04/01/2022) 90 tablet 3   No current facility-administered medications for this encounter.   Allergies:   Contrast media [iodinated contrast media], Iodine, and Ibuprofen   Social History:  The patient  reports that he quit smoking about 27 years ago. His smoking use included cigars. He has never been exposed to tobacco smoke. He has never used smokeless tobacco. He reports current alcohol use. He reports that he does not use drugs.   Family History:  The patient's family history includes Breast cancer in his maternal grandmother; Diabetes in his maternal grandmother; Heart disease in his father; Kidney disease in his father; Kidney failure in his father; Liver cancer in his paternal grandmother and paternal uncle; Valvular heart disease in his mother.   ROS:  Please see the history of present illness.   All other systems are personally reviewed and negative.   Recent Labs: 11/02/2021: Magnesium 2.1 11/03/2021: Platelets 120 11/04/2021: ALT 10 12/25/2021: Hemoglobin 11.2 03/27/2022: B Natriuretic Peptide 458.6; BUN 76; Creatinine, Ser 2.44; Potassium 4.6; Sodium 139  Personally reviewed   Wt Readings from Last 3 Encounters:  04/01/22 127.6 kg (281 lb 3.2 oz)  03/27/22 125.6 kg (277 lb)  02/26/22 113.9 kg (251 lb)    BP (!) 152/84   Pulse 74   Wt 127.6 kg (281 lb 3.2 oz)   SpO2 98%   BMI 41.53 kg/m  Physical Exam General:  Walked into clinic, mild conversational dyspnea, fatigued-appearing. HEENT: Normal Neck: Supple. JVP to ear. Carotids  2+ bilat; no bruits. No lymphadenopathy or  thryomegaly appreciated. Cor: PMI nondisplaced. Regular rate & rhythm. No rubs, gallops or murmurs. Lungs: Clear Abdomen: Soft, nontender, +distended. No hepatosplenomegaly. No bruits or masses. Good bowel sounds. Extremities: No cyanosis, clubbing, rash, 2-3+ BLE edema to thighs, extremities warm Neuro: Alert & oriented x 3, cranial nerves grossly intact. Moves all 4 extremities w/o difficulty. Affect pleasant.  Assessment & Plan: 1. Acute on chronic heart failure with Mid Range EF/ With prominent RV failure/pulmonary hypertension:  Ischemic cardiomyopathy.  ?If RV failure is related to severe OSA, he was a remote smoker. Echo in 2/21 with EF 45-50%, moderately dilated RV with moderately decreased systolic function, moderate pulmonary hypertension. Echo in 8/23 showed EF 40-45%, diffuse hypokinesis with dyssynchrony, mildly dilated RV with mildly decreased systolic function, IVC dilated, moderate AS with mean gradient 15, mild-moderate mitral stenosis with mean gradient 6.  Worsening NYHA IIIb-IV symptoms today, with massive volume overload, failed Furoscix. Weight up 25-30 lbs, ReDs 42%. Discussed with Dr. Daniel Nones, he needs admission for IV diuresis +/- inotropic support. - Needs IV Lasix 120 mg + metolazone 5 mg po x 1. - Continue current KCl regimen.  - Continue Farxiga 10 mg daily. - Continue hydralazine 12.5 mg tid for afterload reduction.  - He is now off ARB and spironolactone with elevated creatinine.   - No Imdur given tadalafil use for PH.  2. Atrial fibrillation: Chronic. Rate controlled.  - Continue Eliquis. CBC today. - Not on beta blocker with history of bradycardia.   3. CAD: S/p CABG 2005.  Cardiolite 12/22 with prior MI, no ischemia.  No further chest pain. High threshold for cath given CKD.  - No ASA given Eliquis use.  - Continue Crestor 10 mg daily, good lipids 2/23. 4. CKD Stage 3: S/p renal transplantation in 2012. [facility]. He  remains on mycophenolate and tacrolimus.   - He will need a tac level at 8 am. 5. OSA: Continue Bipap qhs. 6. HTN: BP elevated today.  - Diurese as above. 7. DM 2: On insulin. He follows with Endocrinology. - Continue dapagliflozin for now. 8. Pulmonary hypertension: Severe pulmonary hypertension by 3/20 echo.  RHC in 2/21 with moderate, mixed pulmonary venous/pulmonary arterial hypertension (PVR 3.25 WU). Suspect group 2 and group 3 PH (OSA, ? unrecognized OHS). Also concern for possible group 1 component.  - Using BiPAP nightly. - Would hold tadalafil for now until better diuresed. 9. Aortic stenosis: Moderate on echo 8/23.  10. Mitral stenosis: Mild to moderate on recent echo. Suspect due to calcific degeneration, not rheumatic heart disease.   Discussed with Dr. Daniel Nones, patient needs admission to progressive care for IV diuresis.  Check CMET, CBC, BNP, TSH, and lactic now.  Maricela Bo [facility] FNP-BC 04/01/2022

## 2022-04-01 NOTE — H&P (Signed)
Advanced Heart Failure Team History and Physical Note   PCP:  [name], MD  PCP-Cardiology: [name], MD    AHFC: [name]   Reason for Admission: Acute on Chronic Systolic Heart  Failure    HPI:    Carl Arvie Bartholomew. is a 62 y.o. male  with a history of CAD s/p CABG 2005, chronic atrial fibrillation on Eliquis, ESRD s/p renal transplant 2012, anc chronic diastolic CHF.  His renal function has been fairly stable, most recent creatinine 1.43.  He has been struggling with volume overload/CHF.  He has a history of ischemic cardiomyopathy with EF as low as 35-40% in the past but echo in 3/20 showed EF 60-65%.  There is evidence for RV failure with pulmonary hypertension by echo.     Admitted 11/05/18 low grade fever and shortness of breath. HS Trop negative. Covid 19 negative. Blood CX negative. Placed on antibiotics and discharged to home the next day.    Admitted 03/30/08 with A/C Diastolic HF. Diuresed with IV lasix and transitioned to torsemide 80/40 mg . Discharge weight 252 pounds.    Admitted 06/2019 with left arm pain. Left upper extremity venous Doppler revealed occluded cephalic vein at the AV fistula outflow tract in the antecubital fossa with no other thrombus identified. Vascular evaluated. He continued on Eliquis.  No plan for intervention. Also treated for acute gout flare. Discharged on prednisone taper.      Given IV lasix on 12/21 by HF Paramedicine.    Admitted to [facility] 2/22 with acute gout flare vs cellulitis. Nephrology was consulted to evaluate transplant regimen and follow along. He was treated with antibiotics and prednisone. Discharged on decreased torsemide dose of 40 mg daily. Weight 259 lbs.   Admitted 4/22 for left leg cellulitis after sustaining a dog scratch, received IV antibiotics. No changes to his diuretic therapy. Discharge weight 266 lbs.   Follow up with [name], 1/23, he had CP and underwent Lexiscan myoview, which overall was a  low risk study with evidence of prior infarct in the infero-apical area with no significant change from before. Losartan added to GDMT regimen.   Follow up 5/23, volume overloaded, REDs 40%. Advised taking additional metolazone (3 doses that week), however labs showed worsening renal function and advised to keep at 2 doses.   Admitted in 5/23 with acute on chronic CHF and AKI.  Gareth Eagle and losartan stopped. Diuresed with IV lasix. Echo showed EF 40-45%, RV mildly reduced, GIIIDD (restrictive), IVC dilated. Nephrology consulted for diuretic dosing. Discharged home, weight 266 lbs.   He had chest pain in 8/23 and was admitted at [facility], minimal HS-TnI elevation with no trend, thought to be demand ischemia from volume overload.     Echo 8/23 showing EF 40-45%, diffuse hypokinesis with dyssynchrony, mildly dilated RV with mildly decreased systolic function, IVC dilated, moderate AS with mean gradient 15, mild-moderate mitral stenosis with mean gradient 6.    Follow up 03/27/22, volume overloaded with 25 lbs weight gain. Instructed to use Furoscix x 3 days, then resume home dose of torsemide + metolazone. Clinic weight 277 lbs.   Today he returns for an acute visit with worsening dyspnea and poor urinary output after using Furoscix x 3 days last week. He is SOB walking on flat ground and has chest pressure. Has early satiety, legs and belly are swelling. He has wheezing laying flat. Denies palpitations, abnormal bleeding, orthopnea. Appetite poor. No fever or chills. Weight at home 272  pounds. Taking all medications.    ECG (personally reviewed): AF, IVCD QRS 124 msec, 71 bpm   REDs: 42%   Pt is being direct admitted for further management and diuresis w/ IV Lasix.     Review of Systems: [y] = yes, _0  = no   General: Weight gain [Y ]; Weight loss _1 ; Anorexia [Y ]; Fatigue [Y ]; Fever _2 ; Chills _3 ; Weakness _4   Cardiac: Chest pain/pressure _5 ; Resting SOB [ Y]; Exertional SOB [ Y];  Orthopnea _6 ; Pedal Edema [Y ]; Palpitations _7 ; Syncope _8 ; Presyncope _9 ; Paroxysmal nocturnal dyspnea_10   Pulmonary: Cough _11 ; Wheezing_12 ; Hemoptysis_13 ; Sputum _14 ; Snoring _15   GI: Vomiting_16 ; Dysphagia_17 ; Melena_18 ; Hematochezia _19 ; Heartburn_20 ; Abdominal pain _21 ; Constipation _22 ; Diarrhea _23 ; BRBPR _24   GU: Hematuria_25 ; Dysuria _26 ; Nocturia_27   Vascular: Pain in legs with walking _28 ; Pain in feet with lying flat _29 ; Non-healing sores _30 ; Stroke _31 ; TIA _32 ; Slurred speech _33 ;  Neuro: Headaches_34 ; Vertigo_35 ; Seizures_36 ; Paresthesias_37 ;Blurred vision _38 ; Diplopia _39 ; Vision changes _40   Ortho/Skin: Arthritis _41 ; Joint pain _42 ; Muscle pain _43 ; Joint swelling _44 ; Back Pain _45 ; Rash _46   Psych: Depression_47 ; Anxiety_48   Heme: Bleeding problems _49 ; Clotting disorders _50 ; Anemia _51   Endocrine: Diabetes _52 ; Thyroid dysfunction_53    Home Medications Prior to Admission medications   Medication Sig Start Date End Date Taking? Authorizing Provider  albuterol (PROVENTIL) (2.5 MG/3ML) 0.083% nebulizer solution Take 3 mLs (2.5 mg total) by nebulization every 6 (six) hours as needed for wheezing or shortness of breath. 01/02/21   Michela Pitcher, NP  albuterol (VENTOLIN HFA) 108 (90 Base) MCG/ACT inhaler Inhale 2 puffs into the lungs every 6 (six) hours as needed for wheezing or shortness of breath. 10/17/20   Martyn Ehrich, NP  allopurinol (ZYLOPRIM) 100 MG tablet Take 100 mg by mouth daily.    [provider]  apixaban (ELIQUIS) 5 MG TABS tablet TAKE 1 TABLET BY MOUTH TWICE DAILY 03/15/22 03/15/23  [name], MD  beclomethasone (QVAR) 80 MCG/ACT inhaler Inhale 2 puffs into the lungs as needed.    [provider]  buPROPion [facility] SR) 150 MG 12 hr tablet TAKE 1 TABLET BY MOUTH TWICE DAILY 05/29/21 06/16/22  Dutch Quint B, FNP  cholecalciferol (VITAMIN D3) 25 MCG (1000 UNIT) tablet Take 1,000 Units by mouth at bedtime.    [provider]  colchicine 0.6 MG tablet Take 1 tablet (0.6 mg total) by mouth every other day 10/22/21     Continuous Blood Gluc Sensor (FREESTYLE LIBRE 3 SENSOR) MISC Use one every 2 weeks. 05/28/21     dapagliflozin propanediol (FARXIGA) 10 MG TABS tablet Take 1 tablet (10 mg total) by mouth daily before breakfast. 12/04/21   Milford, Maricela Bo, FNP  febuxostat (ULORIC) 40 MG tablet Take 3 tablets (120 mg total) by mouth once daily 01/22/22     fluticasone (FLONASE) 50 MCG/ACT nasal spray PLACE 2 SPRAYS INTO BOTH NOSTRILS DAILY. 08/03/21 08/03/22  [name], MD  Furosemide (FUROSCIX) 80 MG/10ML CTKT Inject 80 mg into the skin daily for 3 doses. 03/27/22 03/30/22  [name], MD  gabapentin (NEURONTIN) 300 MG capsule TAKE 1 CAPSULE BY MOUTH TWICE DAILY 12/04/21  hydrALAZINE (APRESOLINE) 25 MG tablet Take 1/2 tablet (12.5 mg total) by mouth every 8 (eight) hours. 01/22/22 03/28/23  [name], MD  hydrocortisone 2.5 % cream Apply topically 3 (three) times daily as needed. 01/21/22   [name], MD  insulin glargine-yfgn (SEMGLEE) 100 UNIT/ML Pen Inject 35 Units subcutaneously once daily 01/22/22     insulin lispro (HUMALOG KWIKPEN) 100 UNIT/ML KwikPen INJECT UP TO 50 UNITS UNDER THE SKIN DAILY AS DIRECTED 10/10/21     Insulin Pen Needle (UNIFINE PENTIPS) 31G X 5 MM MISC Use 4 times daily as directed 05/28/21     iron polysaccharides (FERREX 150) 150 MG capsule TAKE 1 CAPSULE BY MOUTH 2 TIMES DAILY. 01/07/22   [name], MD  lidocaine (LIDODERM) 5 % Place 1 patch onto the skin as needed. Remove & Discard patch within 12 hours or as directed by MD    [provider]  losartan (COZAAR) 50 MG tablet Take 1 tablet (50 mg total) by mouth at bedtime. Patient not taking: Reported on 04/01/2022 03/01/22     metolazone (ZAROXOLYN) 2.5 MG tablet Take 1 tablet by mouth 3 times weekly alternating with 2 times weekly. 12/07/21   Rafael Bihari, FNP  montelukast (SINGULAIR) 10 MG  tablet TAKE 1 TABLET BY MOUTH AT BEDTIME. 12/04/21 12/04/22  Martyn Ehrich, NP  Multiple Vitamin (MULTIVITAMIN WITH MINERALS) TABS tablet Take 1 tablet by mouth daily.    [provider]  mycophenolate (MYFORTIC) 180 MG EC tablet Take 360 mg by mouth 2 (two) times daily.    [provider]  omeprazole (PRILOSEC) 20 MG capsule TAKE 1 CAPSULE BY MOUTH DAILY. 01/07/22 01/07/23  [name], MD  Potassium Chloride CR (MICRO-K) 8 MEQ CPCR capsule CR Take 14 capsules (112 mEq total) by mouth 2 (two) times daily. 03/06/22   [name], MD  predniSONE (DELTASONE) 5 MG tablet Take 1 tablet (5 mg total) by mouth daily. 03/01/22     rosuvastatin (CRESTOR) 10 MG tablet Take 1 tablet (10 mg total) by mouth daily. 07/25/21   [name], MD  sulfamethoxazole-trimethoprim (BACTRIM) 400-80 MG tablet TAKE 1 TABLET BY MOUTH EVERY MONDAY, [place], FRIDAY. 09/21/21     tacrolimus (PROGRAF) 1 MG capsule Take 2 capsules (2 mg total) by mouth 2 times daily. 03/01/22     tadalafil, PAH, (ADCIRCA) 20 MG tablet Take 1 tablet (20 mg total) by mouth daily. 11/07/21   [name], MD  tamsulosin (FLOMAX) 0.4 MG CAPS capsule TAKE 1 CAPSULE BY MOUTH DAILY. 05/28/21     Testosterone 10 MG/ACT (2%) GEL 1 pump applied to inner thigh daily- 2 pumps total 01/27/22   Stoioff, Ronda Fairly, MD  torsemide (DEMADEX) 100 MG tablet Take 1 tablet (100 mg total) by mouth 2 (two) times daily. 08/23/21   Milford, Maricela Bo, FNP  zinc sulfate 220 (50 Zn) MG capsule Take 220 mg by mouth daily.    [provider]    Past Medical History: Past Medical History:  Diagnosis Date   Anemia    Aortic atherosclerosis ([place])    Asthma, persistent controlled 02/25/2013   Attention or concentration deficit 04/26/2021   BPH with obstruction/lower urinary tract symptoms 09/25/2014   CAD (coronary artery disease) 2005   a.) LHC 1996 -> HG stenosis mLAD -> PTCA/PCI with stent x 1 (unk type) -> complicated by CFA  pseudoanurysm (required surg);  b.) LHC 08/10/2002  -> 95% LAD, 70% ISR LAD, 80% pOM,  70% mRCA --> CVTS consult; c.) 4v CABG 08/13/2002; c.) 03/2018 MV: Small, fixed inferoapical and apical sep defect. No isc. EF 47% (60-65% by 05/2018 TTE); d.) 02/2021 MV: small Apical inf fixed defect. No isc -> low risk.   Cellulitis and abscess of left leg 07/22/2020   Chronic atrial fibrillation    a.) CHA2DS2VASc = 4 (CHF, HTN, vascular disease history, T2DM);  b.) rate/rhythm maintained without pharmacological intervention; chronically anticoagulated with apixaban   Chronic combined systolic and diastolic CHF (congestive heart failure)    a.  7/18 Echo: EF 45-50%; b. 05/2018 Echo: EF 60-65%, RVSP 74.8; c. 12/2018 Echo: EF 50-55%. Sev dil LA; d. 04/2019 Echo: EF 45-50%; e. 07/2021 Echo: EF 40-45%, GrIII DD; f. 10/2021 Echo: EF 40-45%, mild conc LVH, septal-lat dyssynchrony (LBBB), mildly reduced RV fxn, mild-mod dil LA, mildly dil RA, mild-mod MS, mod AS.   Chronic venous stasis dermatitis of both lower extremities    Cytomegaloviral disease 2017   Diverticulosis of colon 04/22/2013   ESRD (end stage renal disease) ([place])    a.) dialysis dependent 2004-2012; b.) s/p cadaveric RIGHT renal transplant 09/13/2010   Essential hypertension 12/17/2010   GERD (gastroesophageal reflux disease)    Gout    History of 2019 novel coronavirus disease (COVID-19) 06/30/2019   History of renal transplant 09/13/2010   a.) s/p cadaveric donor transplant 09/13/2010   Hx of bilateral cataract extraction 10/2017   Hyperlipidemia    Hypogonadism in male 09/25/2014   Ischemic cardiomyopathy    Left cephalic vein thrombosis 11/4707   Long term current use of anticoagulant    a.) apixaban   Long term current use of immunosuppressive drug    a.) mycophenolate + prednisone + tacrolimus   Major depressive disorder 10/04/2013   Mitral stenosis 11/07/2021   a. TTE 11/07/2021: severe MAC, mild-mod MS (MPG 6 mmHg)   Moderate aortic  stenosis    a. 10/2021 Echo: Mod AS. AVA 1.06cm^2 (VTI). Mean grad 41mHg.   Obesity hypoventilation syndrome 03/31/2012   OSA treated with BiPAP 09/29/2012   PAH (pulmonary artery hypertension)    a. 04/2019 RHC: RA 19, RV 80/20, PA 78/31 (51), PCWP 25, CO/CI 7.69/3.26. PVR 3.25 WU-->Sev PAH, likely primarily PV HTN.   S/P CABG x 4 08/13/2002   a.) LIMA-LAD, LRA-OM, SVG-D1, SVG-dRCA   S/P gastric bypass    Synovitis of finger 06/11/2019   Trigger finger, unspecified little finger 12/23/2018   Type 2 diabetes mellitus with hyperglycemia, with long-term current use of insulin 09/09/2014   Ulcer 05/2016   Left shin   Wears glasses     Past Surgical History: Past Surgical History:  Procedure Laterality Date   CARDIAC CATHETERIZATION N/A 08/10/2002   CATARACT EXTRACTION W/PHACO Right 10/29/2017   Procedure: CATARACT EXTRACTION PHACO AND INTRAOCULAR LENS PLACEMENT ([place]  RIGHT DIABETIC;  Surgeon: BLeandrew Koyanagi MD;  [place] [facility]  Service: Ophthalmology;  Laterality: Right;  Diabetic - insulin   CATARACT EXTRACTION W/PHACO Left 11/18/2017   Procedure: CATARACT EXTRACTION PHACO AND INTRAOCULAR LENS PLACEMENT ([place] LEFT IVA/TOPICAL;  Surgeon: BLeandrew Koyanagi MD;  [place] [facility]  Service: Ophthalmology;  Laterality: Left;  DIABETES - insulin sleep apnea   COLONOSCOPY WITH PROPOFOL N/A 04/22/2013   Procedure: COLONOSCOPY WITH PROPOFOL;  Surgeon: DMilus Banister MD;  Location: [facility];  Service: Endoscopy;  Laterality: N/A;   CORONARY ANGIOPLASTY WITH STENT PLACEMENT N/A 1996   CORONARY ARTERY BYPASS GRAFT N/A 08/13/2002   Procedure: 4v CORONARY ARTERY BYPASS  GRAFTING; Location: [name]; Surgeon: [name], MD   CYSTOSCOPY WITH INSERTION OF UROLIFT N/A 12/25/2021   Procedure: CYSTOSCOPY WITH INSERTION OF UROLIFT;  Surgeon: [name], MD;  Location: [facility];  Service: Urology;  Laterality: N/A;   DG ANGIO AV SHUNT*L*     right and  left upper arms   FASCIOTOMY  03/03/2012   Procedure: FASCIOTOMY;  Surgeon: [name], MD;  Location: [place];  Service: Orthopedics;  Laterality: Right;  FASCIOTOMY RIGHT SMALL FINGER   FASCIOTOMY Left 08/17/2013   Procedure: FASCIOTOMY LEFT RING;  Surgeon: [name], MD;  Location: [place];  Service: Orthopedics;  Laterality: Left;   INCISION AND DRAINAGE ABSCESS Left 10/15/2015   Procedure: INCISION AND DRAINAGE ABSCESS;  Surgeon: [name], MD;  Location: [facility];  Service: General;  Laterality: Left;   KIDNEY TRANSPLANT  09/13/2010   cadaver--at [facility] HEART CATH N/A 11/15/2016   Procedure: RIGHT HEART CATH;  Surgeon: [name], MD;  Location: [place] [facility];  Service: Cardiovascular;  Laterality: N/A;   RIGHT HEART CATH N/A 05/03/2019   Procedure: RIGHT HEART CATH;  Surgeon: [name], MD;  Location: [place] [facility];  Service: Cardiovascular;  Laterality: N/A;   ROUX-EN-Y GASTRIC BYPASS N/A 2015   TYMPANIC MEMBRANE REPAIR Left 03/2010   VASECTOMY      Family History:  Family History  Problem Relation Age of Onset   Heart disease Father    Kidney failure Father    Kidney disease Father    Diabetes Maternal Grandmother    Breast cancer Maternal Grandmother    Valvular heart disease Mother    Liver cancer Paternal Uncle    Liver cancer Paternal Grandmother    Prostate cancer Neg Hx     Social History: Social History   Socioeconomic History   Marital status: Married    Spouse name: Not on file   Number of children: 2   Years of education: 14   Highest education level: Associate degree: academic program  Occupational History   Occupation: Warden/ranger: unemployed    Comment: disabled due to kidney failure  Tobacco Use   Smoking status: Former    Types: Cigars    Quit date: 09/02/1994    Years since quitting: 27.5    Passive exposure: Never   Smokeless  tobacco: Never   Tobacco comments:    Occasional, rare cigars  Vaping Use   Vaping Use: Never used  Substance and Sexual Activity   Alcohol use: Yes    Alcohol/week: 0.0 - 1.0 standard drinks of alcohol    Comment: occasional - 12-pack lasts a month   Drug use: No   Sexual activity: Not on file  Other Topics Concern   Not on file  Social History Narrative   Has living will   Wife is health care POA   Would accept resuscitation attempts   No tube feedings if cognitively unaware   Social Determinants of Health   Financial Resource Strain: Not on file  Food Insecurity: Not on file  Transportation Needs: Not on file  Physical Activity: Not on file  Stress: Not on file  Social Connections: Not on file    Allergies:  Allergies  Allergen Reactions   Contrast Media [Iodinated Contrast Media] Other (See Comments)    Kidney transplant   Iodine Other (See Comments)    Other reaction(s): Other (See Comments)  Kidney transplant   Ibuprofen Other (See Comments)    Due to kidney transplant Due to kidney transplant Due to kidney transplant    Objective:    Vital Signs:   BP (!) 152/84  Pulse 74  Wt 127.6 kg (281 lb 3.2 oz)  SpO2 98%  BMI 41.53 kg/m    Physical Exam     General:  mild conversational dyspnea, fatigued-appearing. HEENT: Normal Neck: Supple. JVP to ear. Carotids 2+ bilat; no bruits. No lymphadenopathy or thryomegaly appreciated. Cor: PMI nondisplaced. Irregularly irregular rhythm and rate. No rubs, gallops or murmurs. Lungs: Clear Abdomen: Soft, nontender, +distended. No hepatosplenomegaly. No bruits or masses. Good bowel sounds. Extremities: No cyanosis, clubbing, rash, 2-3+ BLE edema to thighs, extremities warm Neuro: Alert & oriented x 3, cranial nerves grossly intact. Moves all 4 extremities w/o difficulty. Affect pleasant.   Telemetry   Afib 70s   EKG   AFib, IVCD QRS 124 msec, 71 bpm   Labs     Basic Metabolic Panel: Recent Labs  Lab  03/27/22 1218  NA 139  K 4.6  CL 100  CO2 30  GLUCOSE 162*  BUN 76*  CREATININE 2.44*  CALCIUM 9.0    Liver Function Tests: No results for input(s): "AST", "ALT", "ALKPHOS", "BILITOT", "PROT", "ALBUMIN" in the last 168 hours. No results for input(s): "LIPASE", "AMYLASE" in the last 168 hours. No results for input(s): "AMMONIA" in the last 168 hours.  CBC: Recent Labs  Lab 04/01/22 1438  WBC 4.6  HGB 9.1*  HCT 32.8*  MCV 73.7*  PLT 116*    Cardiac Enzymes: No results for input(s): "CKTOTAL", "CKMB", "CKMBINDEX", "TROPONINI" in the last 168 hours.  BNP: BNP (last 3 results) Recent Labs    11/07/21 1001 12/07/21 1038 03/27/22 1218  BNP 332.3* 508.4* 458.6*    ProBNP (last 3 results) No results for input(s): "PROBNP" in the last 8760 hours.   CBG: No results for input(s): "GLUCAP" in the last 168 hours.  Coagulation Studies: No results for input(s): "LABPROT", "INR" in the last 72 hours.  Imaging: No results found.   Patient Profile   62 y/o male w/ chronic systolic heart failure, mid-range EF, prominent RV failure/ PH, CAD s/p CABG in 2005, chronic rate controlled Afib, stage III CKD s/p renal transplant in 2012, OSA on CPAP, HTN, Aortic stenosis and mitral stenosis, direct admitted from clinic for a/c CHF w/ NYHA Class IIIb symptoms and failed response to escalation of outpatient diuretic regimen.   Assessment/Plan   1. Acute on chronic heart failure with Mid Range EF/ With prominent RV failure/pulmonary hypertension:  Ischemic cardiomyopathy.  ?If RV failure is related to severe OSA, he was a remote smoker. Echo in 2/21 with EF 45-50%, moderately dilated RV with moderately decreased systolic function, moderate pulmonary hypertension. Echo in 8/23 showed EF 40-45%, diffuse hypokinesis with dyssynchrony, mildly dilated RV with mildly decreased systolic function, IVC dilated, moderate AS with mean gradient 15, mild-moderate mitral stenosis with mean gradient  6.  Worsening NYHA IIIb-IV symptoms today, with massive volume overload, failed Furoscix. Weight up 25-30 lbs, ReDs 42%. Discussed with Dr. Daniel Nones, he needs admission for IV diuresis +/- inotropic support. - Needs IV Lasix 120 mg + metolazone 5 mg po x 1. - Continue current KCl regimen.  - Continue Farxiga 10 mg daily. - Continue hydralazine 12.5 mg tid for afterload reduction.  - He is now off ARB and spironolactone with elevated creatinine.   - No Imdur given tadalafil use for  PH.  2. Atrial fibrillation: Chronic. Rate controlled.  - Continue Eliquis. CBC today. - Not on beta blocker with history of bradycardia.   3. CAD: S/p CABG 2005.  Cardiolite 12/22 with prior MI, no ischemia.  No further chest pain. High threshold for cath given CKD.  - No ASA given Eliquis use.  - Continue Crestor 10 mg daily, good lipids 2/23. 4. CKD Stage 3: S/p renal transplantation in 2012. Followed by transplant center at [facility]. He remains on mycophenolate and tacrolimus.   - He will need a tac level at 8 am. 5. OSA: Continue Bipap every night.  6. HTN: BP elevated today.  - Diurese as above. 7. DM 2: On insulin. He follows with Endocrinology. - Continue dapagliflozin for now. 8. Pulmonary hypertension: Severe pulmonary hypertension by 3/20 echo.  RHC in 2/21 with moderate, mixed pulmonary venous/pulmonary arterial hypertension (PVR 3.25 WU). Suspect group 2 and group 3 PH (OSA, ? unrecognized OHS). Also concern for possible group 1 component.  - Using BiPAP nightly. - Would hold tadalafil for now until better diuresed. 9. Aortic stenosis: Moderate on echo 8/23.  10. Mitral stenosis: Mild to moderate on recent echo. Suspect due to calcific degeneration, not rheumatic heart disease.    Lyda Jester, PA-C 04/01/2022, 3:33 PM  Advanced Heart Failure Team Pager [phone] (M-F; 7a - 5p)  Please contact [facility] for night-coverage after hours (4p -7a ) and weekends on [email or web address]

## 2022-04-01 NOTE — Telephone Encounter (Signed)
Kristi called to report pt's wt is only down 2 lbs from last week and he feels worse, increased SOB, +abd distention, VS stable 124/79, 74. She reports pt used Furoscix x 3 days and then has resumed Torsemide 100 mg BID and metolazone 2.5 mg QOD (took yesterday). Will send to provider for further recommendations

## 2022-04-01 NOTE — Plan of Care (Signed)

## 2022-04-02 ENCOUNTER — Inpatient Hospital Stay (HOSPITAL_COMMUNITY): Payer: Commercial Managed Care - PPO

## 2022-04-02 ENCOUNTER — Encounter (HOSPITAL_COMMUNITY): Payer: Self-pay

## 2022-04-02 ENCOUNTER — Ambulatory Visit: Payer: Medicare Other | Admitting: Urology

## 2022-04-02 DIAGNOSIS — I5023 Acute on chronic systolic (congestive) heart failure: Secondary | ICD-10-CM

## 2022-04-02 LAB — ECHOCARDIOGRAM COMPLETE
AR max vel: 3.11 cm2
AV Area VTI: 3.07 cm2
AV Area mean vel: 2.96 cm2
AV Mean grad: 8.5 mmHg
AV Peak grad: 16.4 mmHg
Ao pk vel: 2.02 m/s
Area-P 1/2: 5.13 cm2
Calc EF: 51.5 %
S' Lateral: 4.85 cm
Single Plane A2C EF: 51.8 %
Single Plane A4C EF: 49.6 %
Weight: 4382.4 oz

## 2022-04-02 LAB — BASIC METABOLIC PANEL
Anion gap: 12 (ref 5–15)
Anion gap: 14 (ref 5–15)
BUN: 79 mg/dL — ABNORMAL HIGH (ref 8–23)
BUN: 80 mg/dL — ABNORMAL HIGH (ref 8–23)
CO2: 34 mmol/L — ABNORMAL HIGH (ref 22–32)
CO2: 31 mmol/L (ref 22–32)
Calcium: 9.1 mg/dL (ref 8.9–10.3)
Calcium: 9.2 mg/dL (ref 8.9–10.3)
Chloride: 95 mmol/L — ABNORMAL LOW (ref 98–111)
Chloride: 93 mmol/L — ABNORMAL LOW (ref 98–111)
Creatinine, Ser: 2.45 mg/dL — ABNORMAL HIGH (ref 0.61–1.24)
Creatinine, Ser: 2.46 mg/dL — ABNORMAL HIGH (ref 0.61–1.24)
GFR, Estimated: 29 mL/min — ABNORMAL LOW (ref >60)
GFR, Estimated: 29 mL/min — ABNORMAL LOW (ref >60)
Glucose, Bld: 191 mg/dL — ABNORMAL HIGH (ref 70–99)
Glucose, Bld: 282 mg/dL — ABNORMAL HIGH (ref 70–99)
Potassium: 3.4 mmol/L — ABNORMAL LOW (ref 3.5–5.1)
Potassium: 3.9 mmol/L (ref 3.5–5.1)
Sodium: 141 mmol/L (ref 135–145)
Sodium: 138 mmol/L (ref 135–145)

## 2022-04-02 LAB — GLUCOSE, CAPILLARY
Glucose-Capillary: 129 mg/dL — ABNORMAL HIGH (ref 70–99)
Glucose-Capillary: 233 mg/dL — ABNORMAL HIGH (ref 70–99)
Glucose-Capillary: 224 mg/dL — ABNORMAL HIGH (ref 70–99)
Glucose-Capillary: 202 mg/dL — ABNORMAL HIGH (ref 70–99)

## 2022-04-02 MED ORDER — PERFLUTREN LIPID MICROSPHERE
1.0000 mL | INTRAVENOUS | Status: AC | PRN
  Administered 2022-04-02: 2 mL via INTRAVENOUS

## 2022-04-02 MED ORDER — POTASSIUM CHLORIDE CRYS ER 10 MEQ PO TBCR: 40.0000 meq | EXTENDED_RELEASE_TABLET | Freq: Four times a day (QID) | ORAL | Status: DC

## 2022-04-02 MED ORDER — FUROSEMIDE 10 MG/ML IJ SOLN
120.0000 mg | Freq: Two times a day (BID) | INTRAVENOUS | Status: DC
  Administered 2022-04-02 – 2022-04-04 (×5): 120 mg via INTRAVENOUS
  Filled 2022-04-02 (×3): qty 10
  Filled 2022-04-02: qty 12
  Filled 2022-04-02: qty 2
  Filled 2022-04-02: qty 10

## 2022-04-02 MED ORDER — FUROSEMIDE 10 MG/ML IJ SOLN
120.0000 mg | Freq: Two times a day (BID) | INTRAVENOUS | Status: DC
  Filled 2022-04-02 (×2): qty 12

## 2022-04-02 MED ORDER — METOLAZONE 5 MG PO TABS
5.0000 mg | ORAL_TABLET | Freq: Once | ORAL | Status: AC
  Administered 2022-04-02: 5 mg via ORAL
  Filled 2022-04-02: qty 1

## 2022-04-02 MED ORDER — POTASSIUM CHLORIDE CRYS ER 10 MEQ PO TBCR
40.0000 meq | EXTENDED_RELEASE_TABLET | Freq: Four times a day (QID) | ORAL | Status: AC
  Administered 2022-04-02 (×4): 40 meq via ORAL
  Filled 2022-04-02 (×3): qty 4

## 2022-04-02 MED ORDER — POTASSIUM CHLORIDE CRYS ER 10 MEQ PO TBCR: 40.0000 meq | EXTENDED_RELEASE_TABLET | Freq: Once | ORAL | Status: DC

## 2022-04-02 MED ORDER — ISOSORBIDE MONONITRATE ER 30 MG PO TB24
30.0000 mg | ORAL_TABLET | Freq: Every day | ORAL | Status: DC
  Administered 2022-04-02 – 2022-04-06 (×5): 30 mg via ORAL
  Filled 2022-04-02 (×5): qty 1

## 2022-04-02 MED ORDER — POTASSIUM CHLORIDE CRYS ER 20 MEQ PO TBCR: 40.0000 meq | EXTENDED_RELEASE_TABLET | Freq: Four times a day (QID) | ORAL | Status: DC

## 2022-04-02 NOTE — Progress Notes (Signed)
Patient placed himself on home BIPAP machine. Vital signs stable at this time.

## 2022-04-02 NOTE — Progress Notes (Signed)
Heart Failure Navigator Progress Note  Assessed for Heart & Vascular TOC clinic readiness.  Patient does not meet criteria due to Advanced Heart Failure Team patient of [name].   Navigator will sign off at this time.   Earnestine Leys, BSN, Clinical cytogeneticist Only

## 2022-04-02 NOTE — Plan of Care (Signed)

## 2022-04-02 NOTE — Progress Notes (Addendum)
Advanced Heart Failure Rounding Note  PCP-Cardiologist: [name], MD  [facility]: [name]   Subjective:    Received 120 mg IV Lasix x 1 + 5 of metolazone yesterday evening.  - 2.8L in UOP - down 8 lb from yesterday's clinic wt  - SCr 2.30>>2.45. BUN 79. K 3.4. CO2 34   tacrolimus level pending   Feels ok. OOB, up in chair. No dyspnea.   Objective:   Weight Range: 124.2 kg Body mass index is 40.45 kg/m.   Vital Signs:   Temp:  [97.6 F (36.4 C)-98 F (36.7 C)] 97.8 F (36.6 C) (01/09 0731) Pulse Rate:  [62-81] 62 (01/09 0731) Resp:  [18] 18 (01/08 1611) BP: (109-165)/(51-99) 109/67 (01/09 0731) SpO2:  [94 %-99 %] 98 % (01/09 0731) Weight:  [124.2 kg] 124.2 kg (01/09 0416)    Weight change: Filed Weights   04/02/22 0416  Weight: 124.2 kg    Intake/Output:   Intake/Output Summary (Last 24 hours) at 04/02/2022 0926 Last data filed at 04/02/2022 0600 Gross per 24 hour  Intake --  Output 2750 ml  Net -2750 ml      Physical Exam    General:  Well appearing. No resp difficulty HEENT: Normal Neck: Supple. JVP elevated to ear . Carotids 2+ bilat; no bruits. No lymphadenopathy or thyromegaly appreciated. Cor: PMI nondisplaced. Irregularly irregular rhythm. No rubs, gallops or murmurs. Lungs: decreased BS at the bases bilaterally  Abdomen: obese, mildly distended, nontender, No hepatosplenomegaly. No bruits or masses. Good bowel sounds. Extremities: No cyanosis, clubbing, rash, 1+ b/l LE edema w/ chronic venous stasis dermatitis Neuro: Alert & orientedx3, cranial nerves grossly intact. moves all 4 extremities w/o difficulty. Affect pleasant   Telemetry   Afib (chronic 50s)   EKG    N/A   Labs    CBC Recent Labs    04/01/22 1438  WBC 4.6  HGB 9.1*  HCT 32.8*  MCV 73.7*  PLT 381*   Basic Metabolic Panel Recent Labs    04/01/22 1438 04/02/22 0227  NA 139 141  K 3.8 3.4*  CL 95* 95*  CO2 32 34*  GLUCOSE 119* 191*  BUN 75* 79*   CREATININE 2.30* 2.45*  CALCIUM 8.9 9.1   Liver Function Tests Recent Labs    04/01/22 1438  AST 21  ALT 16  ALKPHOS 95  BILITOT 0.6  PROT 6.6  ALBUMIN 3.4*   No results for input(s): "LIPASE", "AMYLASE" in the last 72 hours. Cardiac Enzymes No results for input(s): "CKTOTAL", "CKMB", "CKMBINDEX", "TROPONINI" in the last 72 hours.  BNP: BNP (last 3 results) Recent Labs    12/07/21 1038 03/27/22 1218 04/01/22 1438  BNP 508.4* 458.6* 419.8*    ProBNP (last 3 results) No results for input(s): "PROBNP" in the last 8760 hours.   D-Dimer No results for input(s): "DDIMER" in the last 72 hours. Hemoglobin A1C No results for input(s): "HGBA1C" in the last 72 hours. Fasting Lipid Panel No results for input(s): "CHOL", "HDL", "LDLCALC", "TRIG", "CHOLHDL", "LDLDIRECT" in the last 72 hours. Thyroid Function Tests Recent Labs    04/01/22 1438  TSH 2.828    Other results:   Imaging    DG Chest 2 View  Result Date: 04/02/2022 CLINICAL DATA:  CHF EXAM: CHEST - 2 VIEW COMPARISON:  Chest x-ray dated November 02, 2021 FINDINGS: Unchanged cardiomegaly. Status post median sternotomy and CABG. Calcified mediastinal and hilar lymph nodes, likely sequela of prior granulomatous infection. Elevation of the right hemidiaphragm. Mild  vascular congestion with no evidence overt pulmonary edema or consolidation. IMPRESSION: Unchanged cardiomegaly with mild vascular congestion. No overt pulmonary edema or consolidation. Electronically Signed   By: Yetta Glassman M.D.   On: 04/02/2022 08:10     Medications:     Scheduled Medications:  allopurinol  100 mg Oral Daily   apixaban  5 mg Oral BID   buPROPion  150 mg Oral BID   dapagliflozin propanediol  10 mg Oral QAC breakfast   febuxostat  120 mg Oral Daily   gabapentin  300 mg Oral BID   hydrALAZINE  12.5 mg Oral Q8H   insulin aspart  0-15 Units Subcutaneous TID WC   montelukast  10 mg Oral QHS   mycophenolate  360 mg Oral BID    pantoprazole  40 mg Oral Daily   predniSONE  5 mg Oral Daily   rosuvastatin  10 mg Oral Daily   sodium chloride flush  3 mL Intravenous Q12H   [START ON 04/03/2022] sulfamethoxazole-trimethoprim  1 tablet Oral Q M,W,F   tacrolimus  2 mg Oral BID   tamsulosin  0.4 mg Oral Daily    Infusions:  sodium chloride      PRN Medications: sodium chloride, acetaminophen, albuterol, colchicine, melatonin, sodium chloride flush    Patient Profile   62 y/o male w/ chronic systolic heart failure, mid-range EF, prominent RV failure/ PH, CAD s/p CABG in 2005, chronic rate controlled Afib, stage III CKD s/p renal transplant in 2012, OSA on CPAP, HTN, Aortic stenosis and mitral stenosis, direct admitted from clinic for a/c CHF w/ NYHA Class IIIb symptoms and failed response to escalation of outpatient diuretic regimen.   Assessment/Plan   1. Acute on chronic heart failure with Mid Range EF/ With prominent RV failure/pulmonary hypertension:  Ischemic cardiomyopathy.  ?If RV failure is related to severe OSA, he was a remote smoker. Echo in 2/21 with EF 45-50%, moderately dilated RV with moderately decreased systolic function, moderate pulmonary hypertension. Echo in 8/23 showed EF 40-45%, diffuse hypokinesis with dyssynchrony, mildly dilated RV with mildly decreased systolic function, IVC dilated, moderate AS with mean gradient 15, mild-moderate mitral stenosis with mean gradient 6.  Admitted w/ worsening NYHA IIIb-IV symptoms, with massive volume overload, failed Furoscix. Weight up 25-30 lbs, ReDs 42% on admit. 2.7L in UOP after 120 mg IV Lasix + metolazone 5 mg. Wt trending down but remains volume overloaded. SCr 2.30>>2.45 - Continue diuresis today. 120 IV Lasix BID + 5 mg metolazone. Will need RHC this admit  - Supp K w/ KCl  - Continue Farxiga 10 mg daily. - Continue hydralazine 12.5 mg tid for afterload reduction.  - He is now off ARB and spironolactone with elevated creatinine.   - Stop tadalafil  (think PH is group 2,3) and start Imdur 30 mg daily.  - Echo today.  2. Atrial fibrillation: Chronic. Rate controlled.  - Continue Eliquis. - Not on beta blocker with history of bradycardia.   3. CAD: S/p CABG 2005.  Cardiolite 12/22 with prior MI, no ischemia.  No further chest pain. High threshold for cath given CKD.  - No ASA given Eliquis use.  - Continue Crestor 10 mg daily, good lipids 2/23. 4. CKD Stage 3: S/p renal transplantation in 2012. Followed by transplant center at [facility]. He remains on mycophenolate and tacrolimus.   - tacrolimus level pending  5. OSA: Continue Bipap every night.  6. HTN: Controlled on current regimen  7. DM 2: On insulin. He follows with  Endocrinology. - Continue dapagliflozin for now. 8. Pulmonary hypertension: Severe pulmonary hypertension by 3/20 echo.  RHC in 2/21 with moderate, mixed pulmonary venous/pulmonary arterial hypertension (PVR 3.25 WU). Suspect group 2 and group 3 PH (OSA, ? unrecognized OHS).  - Using BiPAP nightly. - Stop tadalafil and use Imdur as above.  9. Aortic stenosis: Moderate on echo 8/23.  10. Mitral stenosis: Mild to moderate on recent echo. Suspect due to calcific degeneration, not rheumatic heart disease.   Length of Stay: 1  Lyda Jester, PA-C  04/02/2022, 9:26 AM  Advanced Heart Failure Team Pager [phone] (M-F; 7a - 5p)  Please contact [facility] for night-coverage after hours (5p -7a ) and weekends on [email or web address]  Patient seen with PA, agree with the above note.   Good diuresis with IV Lasix and metolazone yesterday.  Creatinine mildly higher at 2.45.    General: NAD Neck: JVP 14-16 cm, no thyromegaly or thyroid nodule.  Lungs: Clear to auscultation bilaterally with normal respiratory effort. CV: Nondisplaced PMI.  Heart irregular S1/S2, no S3/S4, 2/6 SEM RUSB.  2+ edema to knees.  Abdomen: Soft, nontender, no hepatosplenomegaly, no distention.  Skin: Intact without lesions or rashes.  Neurologic:  Alert and oriented x 3.  Psych: Normal affect. Extremities: No clubbing or cyanosis.  HEENT: Normal.   Patient diuresed yesterday but remains volume overloaded.  Prominent RV failure with LV EF decreased in 40-45% range. Creatinine mildly elevated but relatively stable compared to baseline.  Cardiorenal syndrome.  - Lasix 120 mg IV bid + metolazone 5 mg x 1 today.  - With rise in HCO3, consider acetazolamide addition tomorrow.  - Echo today.  - Suspect he has group 2 and group 3 PH, will stop tadalafil for now and start Imdur 30 mg daily in addition to his hydralazine.  Suspect we are not helping him much with tadalafil.  - RHC when better-diuresed.   Loralie Champagne 04/02/2022 11:41 AM

## 2022-04-02 NOTE — Progress Notes (Signed)
  Echocardiogram 2D Echocardiogram has been performed.  [name] 04/02/2022, 11:56 AM

## 2022-04-03 ENCOUNTER — Other Ambulatory Visit: Payer: Self-pay

## 2022-04-03 DIAGNOSIS — I5023 Acute on chronic systolic (congestive) heart failure: Secondary | ICD-10-CM | POA: Diagnosis not present

## 2022-04-03 LAB — BASIC METABOLIC PANEL
Anion gap: 13 (ref 5–15)
BUN: 79 mg/dL — ABNORMAL HIGH (ref 8–23)
CO2: 31 mmol/L (ref 22–32)
Calcium: 9 mg/dL (ref 8.9–10.3)
Chloride: 97 mmol/L — ABNORMAL LOW (ref 98–111)
Creatinine, Ser: 2.45 mg/dL — ABNORMAL HIGH (ref 0.61–1.24)
GFR, Estimated: 29 mL/min — ABNORMAL LOW (ref >60)
Glucose, Bld: 155 mg/dL — ABNORMAL HIGH (ref 70–99)
Potassium: 3.6 mmol/L (ref 3.5–5.1)
Sodium: 141 mmol/L (ref 135–145)

## 2022-04-03 LAB — GLUCOSE, CAPILLARY
Glucose-Capillary: 159 mg/dL — ABNORMAL HIGH (ref 70–99)
Glucose-Capillary: 191 mg/dL — ABNORMAL HIGH (ref 70–99)
Glucose-Capillary: 205 mg/dL — ABNORMAL HIGH (ref 70–99)
Glucose-Capillary: 265 mg/dL — ABNORMAL HIGH (ref 70–99)

## 2022-04-03 MED ORDER — POTASSIUM CHLORIDE CRYS ER 10 MEQ PO TBCR
40.0000 meq | EXTENDED_RELEASE_TABLET | Freq: Four times a day (QID) | ORAL | Status: AC
  Administered 2022-04-03 (×3): 40 meq via ORAL
  Filled 2022-04-03 (×3): qty 4

## 2022-04-03 MED ORDER — SODIUM CHLORIDE 0.9% FLUSH
3.0000 mL | Freq: Two times a day (BID) | INTRAVENOUS | Status: DC
  Administered 2022-04-03 – 2022-04-04 (×2): 3 mL via INTRAVENOUS

## 2022-04-03 MED ORDER — METOLAZONE 5 MG PO TABS
5.0000 mg | ORAL_TABLET | Freq: Once | ORAL | Status: AC
  Administered 2022-04-03: 5 mg via ORAL
  Filled 2022-04-03: qty 1

## 2022-04-03 NOTE — H&P (View-Only) (Signed)
Advanced Heart Failure Rounding Note  PCP-Cardiologist: [name], MD  [facility]: [name]   Subjective:     Echo this admit EF 40-45% (previously 45-50%), RV mod reduced, RVSP 49 mmHg. IVC dilated.   - yesterday got 120 IV Lasix BID + 5 of metolazone  - 3.6L + 1 unmeasured urinary recurrence yesterday  - wt down another 5 lb  - SCr stable 2.30>>2.45>2.46. BUN 79. K 3.6. CO2 31   tacrolimus level pending   Feels ok. No dyspnea.    Objective:   Weight Range: 121.7 kg Body mass index is 39.62 kg/m.   Vital Signs:   Temp:  [97.9 F (36.6 C)-98 F (36.7 C)] 98 F (36.7 C) (01/10 0844) Pulse Rate:  [63-64] 63 (01/10 0844) Resp:  [18] 18 (01/10 0844) BP: (135-142)/(76-78) 135/77 (01/10 0844) SpO2:  [94 %-96 %] 96 % (01/10 0551) Weight:  [121.7 kg] 121.7 kg (01/10 0551)    Weight change: Filed Weights   04/02/22 0416 04/03/22 0551  Weight: 124.2 kg 121.7 kg    Intake/Output:   Intake/Output Summary (Last 24 hours) at 04/03/2022 0932 Last data filed at 04/03/2022 0150 Gross per 24 hour  Intake 724 ml  Output 3625 ml  Net -2901 ml      Physical Exam    General:  Well appearing, mod obese. No resp difficulty HEENT: Normal Neck: Supple. JVP elevated 12 cm. Carotids 2+ bilat; no bruits. No lymphadenopathy or thyromegaly appreciated. Cor: PMI nondisplaced. Irregularly irregular rhythm. No rubs, gallops or murmurs. Lungs: decreased BS at the bases bilaterally  Abdomen: obese, mildly distended, nontender, No hepatosplenomegaly. No bruits or masses. Good bowel sounds. Extremities: No cyanosis, clubbing, rash, 2+ b/l LE edema w/ chronic venous stasis dermatitis Neuro: Alert & orientedx3, cranial nerves grossly intact. moves all 4 extremities w/o difficulty. Affect pleasant   Telemetry   Afib (chronic 50s)   EKG    N/A   Labs    CBC Recent Labs    04/01/22 1438  WBC 4.6  HGB 9.1*  HCT 32.8*  MCV 73.7*  PLT 471*   Basic Metabolic Panel Recent  Labs    04/02/22 1801 04/03/22 0144  NA 138 141  K 3.9 3.6  CL 93* 97*  CO2 31 31  GLUCOSE 282* 155*  BUN 80* 79*  CREATININE 2.46* 2.45*  CALCIUM 9.2 9.0   Liver Function Tests Recent Labs    04/01/22 1438  AST 21  ALT 16  ALKPHOS 95  BILITOT 0.6  PROT 6.6  ALBUMIN 3.4*   No results for input(s): "LIPASE", "AMYLASE" in the last 72 hours. Cardiac Enzymes No results for input(s): "CKTOTAL", "CKMB", "CKMBINDEX", "TROPONINI" in the last 72 hours.  BNP: BNP (last 3 results) Recent Labs    12/07/21 1038 03/27/22 1218 04/01/22 1438  BNP 508.4* 458.6* 419.8*    ProBNP (last 3 results) No results for input(s): "PROBNP" in the last 8760 hours.   D-Dimer No results for input(s): "DDIMER" in the last 72 hours. Hemoglobin A1C No results for input(s): "HGBA1C" in the last 72 hours. Fasting Lipid Panel No results for input(s): "CHOL", "HDL", "LDLCALC", "TRIG", "CHOLHDL", "LDLDIRECT" in the last 72 hours. Thyroid Function Tests Recent Labs    04/01/22 1438  TSH 2.828    Other results:   Imaging    ECHOCARDIOGRAM COMPLETE  Result Date: 04/02/2022    ECHOCARDIOGRAM REPORT   Patient Name:   [name]. Date of Exam: 04/02/2022 Medical Rec #:  [id number]               Height:       69.0 in Accession #:    [id number]              Weight:       273.9 lb Date of Birth:  1960-07-27               BSA:          2.361 m Patient Age:    62 years                BP:           109/67 mmHg Patient Gender: M                       HR:           69 bpm. Exam Location:  Inpatient Procedure: 2D Echo, Cardiac Doppler, Color Doppler and Intracardiac            Opacification Agent Indications:    I50.40* Unspecified combined systolic (congestive) and diastolic                 (congestive) heart failure  History:        Patient has prior history of Echocardiogram examinations, most                 recent 11/07/2021. CHF, Previous Myocardial Infarction and CAD,                 Abnormal  ECG and Prior CABG, Arrythmias:Atrial Fibrillation;                 Risk Factors:Sleep Apnea, Diabetes and Hypertension.  Sonographer:    Roseanna Rainbow RDCS Referring Phys: Lyda Jester, M  Sonographer Comments: Technically difficult study due to poor echo windows and patient is obese. Image acquisition challenging due to patient body habitus. [name] present in room. IMPRESSIONS  1. Left ventricular ejection fraction, by estimation, is 40 to 45%. The left ventricle has mildly decreased function. The left ventricle demonstrates global hypokinesis. There is mild concentric left ventricular hypertrophy. Left ventricular diastolic function could not be evaluated.  2. Right ventricular systolic function is moderately reduced. The right ventricular size is moderately enlarged. There is moderately elevated pulmonary artery systolic pressure. The estimated right ventricular systolic pressure is 93.2 mmHg.  3. Left atrial size was severely dilated.  4. Right atrial size was mild to moderately dilated.  5. The mitral valve is degenerative. Mild mitral valve regurgitation. No evidence of mitral stenosis.  6. The aortic valve is tricuspid. Aortic valve regurgitation is not visualized. No aortic stenosis is present.  7. The inferior vena cava is dilated in size with <50% respiratory variability, suggesting right atrial pressure of 15 mmHg. Comparison(s): No significant change from prior study. FINDINGS  Left Ventricle: Left ventricular ejection fraction, by estimation, is 40 to 45%. The left ventricle has mildly decreased function. The left ventricle demonstrates global hypokinesis. Definity contrast agent was given IV to delineate the left ventricular  endocardial borders. The left ventricular internal cavity size was normal in size. There is mild concentric left ventricular hypertrophy. Abnormal (paradoxical) septal motion consistent with post-operative status. Left ventricular diastolic function could not be evaluated  due to atrial fibrillation. Left ventricular diastolic function could not be evaluated. Right Ventricle: The right ventricular size is moderately enlarged. No increase in right ventricular wall thickness. Right ventricular systolic  function is moderately reduced. There is moderately elevated pulmonary artery systolic pressure. The tricuspid  regurgitant velocity is 2.90 m/s, and with an assumed right atrial pressure of 15 mmHg, the estimated right ventricular systolic pressure is 35.5 mmHg. Left Atrium: Left atrial size was severely dilated. Right Atrium: Right atrial size was mild to moderately dilated. Pericardium: Trivial pericardial effusion is present. Mitral Valve: The mitral valve is degenerative in appearance. Mild to moderate mitral annular calcification. Mild mitral valve regurgitation. No evidence of mitral valve stenosis. MV peak gradient, 11.5 mmHg. Tricuspid Valve: The tricuspid valve is grossly normal. Tricuspid valve regurgitation is trivial. No evidence of tricuspid stenosis. Aortic Valve: The aortic valve is tricuspid. Aortic valve regurgitation is not visualized. No aortic stenosis is present. Aortic valve mean gradient measures 8.5 mmHg. Aortic valve peak gradient measures 16.4 mmHg. Aortic valve area, by VTI measures 3.07  cm. Pulmonic Valve: The pulmonic valve was grossly normal. Pulmonic valve regurgitation is not visualized. No evidence of pulmonic stenosis. Aorta: The aortic root and ascending aorta are structurally normal, with no evidence of dilitation. Venous: The inferior vena cava is dilated in size with less than 50% respiratory variability, suggesting right atrial pressure of 15 mmHg. IAS/Shunts: The atrial septum is grossly normal.  LEFT VENTRICLE PLAX 2D LVIDd:         5.80 cm LVIDs:         4.85 cm LV PW:         1.60 cm LV IVS:        1.30 cm LVOT diam:     2.80 cm LV SV:         134 LV SV Index:   57 LVOT Area:     6.16 cm  LV Volumes (MOD) LV vol d, MOD A2C: 157.0 ml LV vol d,  MOD A4C: 181.0 ml LV vol s, MOD A2C: 75.6 ml LV vol s, MOD A4C: 91.2 ml LV SV MOD A2C:     81.4 ml LV SV MOD A4C:     181.0 ml LV SV MOD BP:      87.1 ml RIGHT VENTRICLE            IVC RV S prime:     4.68 cm/s  IVC diam: 3.45 cm TAPSE (M-mode): 0.9 cm LEFT ATRIUM              Index        RIGHT ATRIUM           Index LA diam:        5.30 cm  2.24 cm/m   RA Area:     24.00 cm LA Vol (A2C):   111.0 ml 47.01 ml/m  RA Volume:   72.50 ml  30.70 ml/m LA Vol (A4C):   134.0 ml 56.75 ml/m LA Biplane Vol: 131.0 ml 55.48 ml/m  AORTIC VALVE AV Area (Vmax):    3.11 cm AV Area (Vmean):   2.96 cm AV Area (VTI):     3.07 cm AV Vmax:           202.25 cm/s AV Vmean:          133.000 cm/s AV VTI:            0.436 m AV Peak Grad:      16.4 mmHg AV Mean Grad:      8.5 mmHg LVOT Vmax:         102.00 cm/s LVOT Vmean:        63.900  cm/s LVOT VTI:          0.217 m LVOT/AV VTI ratio: 0.50  AORTA Ao Root diam: 3.20 cm Ao Asc diam:  3.30 cm MITRAL VALVE                TRICUSPID VALVE MV Area (PHT): 5.13 cm     TR Peak grad:   33.6 mmHg MV Peak grad:  11.5 mmHg    TR Vmax:        290.00 cm/s MV Vmax:       1.69 m/s MV Decel Time: 148 msec     SHUNTS MV E velocity: 169.00 cm/s  Systemic VTI:  0.22 m                             Systemic Diam: 2.80 cm Eleonore Chiquito MD Electronically signed by Eleonore Chiquito MD Signature Date/Time: 04/02/2022/3:15:27 PM    Final      Medications:     Scheduled Medications:  allopurinol  100 mg Oral Daily   apixaban  5 mg Oral BID   buPROPion  150 mg Oral BID   dapagliflozin propanediol  10 mg Oral QAC breakfast   febuxostat  120 mg Oral Daily   gabapentin  300 mg Oral BID   hydrALAZINE  12.5 mg Oral Q8H   insulin aspart  0-15 Units Subcutaneous TID WC   isosorbide mononitrate  30 mg Oral Daily   montelukast  10 mg Oral QHS   mycophenolate  360 mg Oral BID   pantoprazole  40 mg Oral Daily   predniSONE  5 mg Oral Daily   rosuvastatin  10 mg Oral Daily   sodium chloride flush  3 mL  Intravenous Q12H   sulfamethoxazole-trimethoprim  1 tablet Oral Q M,W,F   tacrolimus  2 mg Oral BID   tamsulosin  0.4 mg Oral Daily    Infusions:  sodium chloride     furosemide 120 mg (04/02/22 1830)    PRN Medications: sodium chloride, acetaminophen, albuterol, colchicine, melatonin, sodium chloride flush    Patient Profile   62 y/o male w/ chronic systolic heart failure, mid-range EF, prominent RV failure/ PH, CAD s/p CABG in 2005, chronic rate controlled Afib, stage III CKD s/p renal transplant in 2012, OSA on CPAP, HTN, Aortic stenosis and mitral stenosis, direct admitted from clinic for a/c CHF w/ NYHA Class IIIb symptoms and failed response to escalation of outpatient diuretic regimen.   Assessment/Plan   1. Acute on chronic heart failure with Mid Range EF/ With prominent RV failure/pulmonary hypertension:  Ischemic cardiomyopathy.  ?If RV failure is related to severe OSA, he was a remote smoker. Echo in 2/21 with EF 45-50%, moderately dilated RV with moderately decreased systolic function, moderate pulmonary hypertension. Echo in 8/23 showed EF 40-45%, diffuse hypokinesis with dyssynchrony, mildly dilated RV with mildly decreased systolic function, IVC dilated, moderate AS with mean gradient 15, mild-moderate mitral stenosis with mean gradient 6.  Admitted w/ worsening NYHA IIIb-IV symptoms, with massive volume overload, failed Furoscix. Weight up 25-30 lbs, ReDs 42% on admit. Echo this admit EF 40-45% , RV mod reduced, RVSP 49 mmHg. IVC dilated.  - Diuresing w/ high dose lasix and metolazone. SCr up slightly from baseline but has been stable through this admission. Remains fluid overloaded. - Continue diuresis today. 120 IV Lasix BID + 5 mg metolazone.  - Plan RHC on Friday  - Continue Farxiga 10 mg daily. -  Continue hydralazine 12.5 mg tid for afterload reduction.  - He is now off ARB and spironolactone with elevated creatinine.   - Stopped tadalafil (think PH is group 2,3).  Imdur initiated this admit, 30 mg daily.  2. Atrial fibrillation: Chronic. Rate controlled.  - Continue Eliquis. - Not on beta blocker with history of bradycardia.   3. CAD: S/p CABG 2005.  Cardiolite 12/22 with prior MI, no ischemia.  No further chest pain. High threshold for cath given CKD.  - No ASA given Eliquis use.  - Continue Crestor 10 mg daily, good lipids 2/23. 4. CKD Stage 3: S/p renal transplantation in 2012. Followed by transplant center at [facility]. He remains on mycophenolate and tacrolimus.   - tacrolimus level pending  5. OSA: Continue Bipap every night.  6. HTN: Controlled on current regimen  7. DM 2: On insulin. He follows with Endocrinology. - Continue dapagliflozin for now. 8. Pulmonary hypertension: Severe pulmonary hypertension by 3/20 echo.  RHC in 2/21 with moderate, mixed pulmonary venous/pulmonary arterial hypertension (PVR 3.25 WU). Suspect group 2 and group 3 PH (OSA, ? unrecognized OHS).  - Using BiPAP nightly. - Stop tadalafil and use Imdur as above.  9. Aortic stenosis: Moderate on echo 8/23.  10. Mitral stenosis: Mild to moderate on recent echo. Suspect due to calcific degeneration, not rheumatic heart disease.  11. Chronic Anemia: has been getting scheduled weekly IV Fe infusions, qFridays - will plan IV iron 1/12   Length of Stay: 2  Lyda Jester, PA-C  04/03/2022, 9:32 AM  Advanced Heart Failure Team Pager [phone] (M-F; 7a - 5p)  Please contact [facility] for night-coverage after hours (5p -7a ) and weekends on [email or web address]  Patient seen with PA, agree with the above note.   He is diuresing well, weight down 5 lbs.  No complaints this morning.   General: NAD Neck: JVP 14 cm, no thyromegaly or thyroid nodule.  Lungs: Clear to auscultation bilaterally with normal respiratory effort. CV: Nondisplaced PMI.  Heart irregular S1/S2, no S3/S4, no murmur.  1+ chronic edema to knees.  Abdomen: Soft, nontender, no hepatosplenomegaly, no  distention.  Skin: Intact without lesions or rashes.  Neurologic: Alert and oriented x 3.  Psych: Normal affect. Extremities: No clubbing or cyanosis.  HEENT: Normal.   Still volume overloaded on exam.  He has diuresed well, BUN and creatinine are staying stable in the 2.4 range. We have taken him off tadalafil and placed him on Imdur 30 daily (in addition to his hydralazine).  - Continue Lasix IV + metolazone today, same dosing as yesterday.  - RHC probably Friday. Assess filling pressures and also assess PA pressure (suspect primarily pulmonary venous hypertension). Discussed risks/benefits with patient and he agrees to procedure.  - He is willing to try unna boots.   Continue Eliquis for chronic atrial fibrillation.   Loralie Champagne 04/03/2022 10:20 AM

## 2022-04-03 NOTE — Progress Notes (Signed)
Pt remains on home unit; vital signs stable; will continue to monitor

## 2022-04-03 NOTE — Progress Notes (Signed)
Orthopedic Tech Progress Note Patient Details:  [name]. Apr 08, 1960 [id number]  Ortho Devices Type of Ortho Device: Ace wrap, Unna boot Ortho Device/Splint Location: BLE Ortho Device/Splint Interventions: Ordered, Application   Post Interventions Patient Tolerated: Well Instructions Provided: Care of device  Janit Pagan 04/03/2022, 3:59 PM

## 2022-04-03 NOTE — Plan of Care (Signed)

## 2022-04-03 NOTE — Progress Notes (Addendum)
Advanced Heart Failure Rounding Note  PCP-Cardiologist: [name], MD  [facility]: [name]   Subjective:     Echo this admit EF 40-45% (previously 45-50%), RV mod reduced, RVSP 49 mmHg. IVC dilated.   - yesterday got 120 IV Lasix BID + 5 of metolazone  - 3.6L + 1 unmeasured urinary recurrence yesterday  - wt down another 5 lb  - SCr stable 2.30>>2.45>2.46. BUN 79. K 3.6. CO2 31   tacrolimus level pending   Feels ok. No dyspnea.    Objective:   Weight Range: 121.7 kg Body mass index is 39.62 kg/m.   Vital Signs:   Temp:  [97.9 F (36.6 C)-98 F (36.7 C)] 98 F (36.7 C) (01/10 0844) Pulse Rate:  [63-64] 63 (01/10 0844) Resp:  [18] 18 (01/10 0844) BP: (135-142)/(76-78) 135/77 (01/10 0844) SpO2:  [94 %-96 %] 96 % (01/10 0551) Weight:  [121.7 kg] 121.7 kg (01/10 0551)    Weight change: Filed Weights   04/02/22 0416 04/03/22 0551  Weight: 124.2 kg 121.7 kg    Intake/Output:   Intake/Output Summary (Last 24 hours) at 04/03/2022 0932 Last data filed at 04/03/2022 0150 Gross per 24 hour  Intake 724 ml  Output 3625 ml  Net -2901 ml      Physical Exam    General:  Well appearing, mod obese. No resp difficulty HEENT: Normal Neck: Supple. JVP elevated 12 cm. Carotids 2+ bilat; no bruits. No lymphadenopathy or thyromegaly appreciated. Cor: PMI nondisplaced. Irregularly irregular rhythm. No rubs, gallops or murmurs. Lungs: decreased BS at the bases bilaterally  Abdomen: obese, mildly distended, nontender, No hepatosplenomegaly. No bruits or masses. Good bowel sounds. Extremities: No cyanosis, clubbing, rash, 2+ b/l LE edema w/ chronic venous stasis dermatitis Neuro: Alert & orientedx3, cranial nerves grossly intact. moves all 4 extremities w/o difficulty. Affect pleasant   Telemetry   Afib (chronic 50s)   EKG    N/A   Labs    CBC Recent Labs    04/01/22 1438  WBC 4.6  HGB 9.1*  HCT 32.8*  MCV 73.7*  PLT 471*   Basic Metabolic Panel Recent  Labs    04/02/22 1801 04/03/22 0144  NA 138 141  K 3.9 3.6  CL 93* 97*  CO2 31 31  GLUCOSE 282* 155*  BUN 80* 79*  CREATININE 2.46* 2.45*  CALCIUM 9.2 9.0   Liver Function Tests Recent Labs    04/01/22 1438  AST 21  ALT 16  ALKPHOS 95  BILITOT 0.6  PROT 6.6  ALBUMIN 3.4*   No results for input(s): "LIPASE", "AMYLASE" in the last 72 hours. Cardiac Enzymes No results for input(s): "CKTOTAL", "CKMB", "CKMBINDEX", "TROPONINI" in the last 72 hours.  BNP: BNP (last 3 results) Recent Labs    12/07/21 1038 03/27/22 1218 04/01/22 1438  BNP 508.4* 458.6* 419.8*    ProBNP (last 3 results) No results for input(s): "PROBNP" in the last 8760 hours.   D-Dimer No results for input(s): "DDIMER" in the last 72 hours. Hemoglobin A1C No results for input(s): "HGBA1C" in the last 72 hours. Fasting Lipid Panel No results for input(s): "CHOL", "HDL", "LDLCALC", "TRIG", "CHOLHDL", "LDLDIRECT" in the last 72 hours. Thyroid Function Tests Recent Labs    04/01/22 1438  TSH 2.828    Other results:   Imaging    ECHOCARDIOGRAM COMPLETE  Result Date: 04/02/2022    ECHOCARDIOGRAM REPORT   Patient Name:   [name]. Date of Exam: 04/02/2022 Medical Rec #:  [id number]               Height:       69.0 in Accession #:    [id number]              Weight:       273.9 lb Date of Birth:  1960-07-27               BSA:          2.361 m Patient Age:    62 years                BP:           109/67 mmHg Patient Gender: M                       HR:           69 bpm. Exam Location:  Inpatient Procedure: 2D Echo, Cardiac Doppler, Color Doppler and Intracardiac            Opacification Agent Indications:    I50.40* Unspecified combined systolic (congestive) and diastolic                 (congestive) heart failure  History:        Patient has prior history of Echocardiogram examinations, most                 recent 11/07/2021. CHF, Previous Myocardial Infarction and CAD,                 Abnormal  ECG and Prior CABG, Arrythmias:Atrial Fibrillation;                 Risk Factors:Sleep Apnea, Diabetes and Hypertension.  Sonographer:    Roseanna Rainbow RDCS Referring Phys: [name], M  Sonographer Comments: Technically difficult study due to poor echo windows and patient is obese. Image acquisition challenging due to patient body habitus. [name] present in room. IMPRESSIONS  1. Left ventricular ejection fraction, by estimation, is 40 to 45%. The left ventricle has mildly decreased function. The left ventricle demonstrates global hypokinesis. There is mild concentric left ventricular hypertrophy. Left ventricular diastolic function could not be evaluated.  2. Right ventricular systolic function is moderately reduced. The right ventricular size is moderately enlarged. There is moderately elevated pulmonary artery systolic pressure. The estimated right ventricular systolic pressure is 93.2 mmHg.  3. Left atrial size was severely dilated.  4. Right atrial size was mild to moderately dilated.  5. The mitral valve is degenerative. Mild mitral valve regurgitation. No evidence of mitral stenosis.  6. The aortic valve is tricuspid. Aortic valve regurgitation is not visualized. No aortic stenosis is present.  7. The inferior vena cava is dilated in size with <50% respiratory variability, suggesting right atrial pressure of 15 mmHg. Comparison(s): No significant change from prior study. FINDINGS  Left Ventricle: Left ventricular ejection fraction, by estimation, is 40 to 45%. The left ventricle has mildly decreased function. The left ventricle demonstrates global hypokinesis. Definity contrast agent was given IV to delineate the left ventricular  endocardial borders. The left ventricular internal cavity size was normal in size. There is mild concentric left ventricular hypertrophy. Abnormal (paradoxical) septal motion consistent with post-operative status. Left ventricular diastolic function could not be evaluated  due to atrial fibrillation. Left ventricular diastolic function could not be evaluated. Right Ventricle: The right ventricular size is moderately enlarged. No increase in right ventricular wall thickness. Right ventricular systolic  function is moderately reduced. There is moderately elevated pulmonary artery systolic pressure. The tricuspid  regurgitant velocity is 2.90 m/s, and with an assumed right atrial pressure of 15 mmHg, the estimated right ventricular systolic pressure is 35.5 mmHg. Left Atrium: Left atrial size was severely dilated. Right Atrium: Right atrial size was mild to moderately dilated. Pericardium: Trivial pericardial effusion is present. Mitral Valve: The mitral valve is degenerative in appearance. Mild to moderate mitral annular calcification. Mild mitral valve regurgitation. No evidence of mitral valve stenosis. MV peak gradient, 11.5 mmHg. Tricuspid Valve: The tricuspid valve is grossly normal. Tricuspid valve regurgitation is trivial. No evidence of tricuspid stenosis. Aortic Valve: The aortic valve is tricuspid. Aortic valve regurgitation is not visualized. No aortic stenosis is present. Aortic valve mean gradient measures 8.5 mmHg. Aortic valve peak gradient measures 16.4 mmHg. Aortic valve area, by VTI measures 3.07  cm. Pulmonic Valve: The pulmonic valve was grossly normal. Pulmonic valve regurgitation is not visualized. No evidence of pulmonic stenosis. Aorta: The aortic root and ascending aorta are structurally normal, with no evidence of dilitation. Venous: The inferior vena cava is dilated in size with less than 50% respiratory variability, suggesting right atrial pressure of 15 mmHg. IAS/Shunts: The atrial septum is grossly normal.  LEFT VENTRICLE PLAX 2D LVIDd:         5.80 cm LVIDs:         4.85 cm LV PW:         1.60 cm LV IVS:        1.30 cm LVOT diam:     2.80 cm LV SV:         134 LV SV Index:   57 LVOT Area:     6.16 cm  LV Volumes (MOD) LV vol d, MOD A2C: 157.0 ml LV vol d,  MOD A4C: 181.0 ml LV vol s, MOD A2C: 75.6 ml LV vol s, MOD A4C: 91.2 ml LV SV MOD A2C:     81.4 ml LV SV MOD A4C:     181.0 ml LV SV MOD BP:      87.1 ml RIGHT VENTRICLE            IVC RV S prime:     4.68 cm/s  IVC diam: 3.45 cm TAPSE (M-mode): 0.9 cm LEFT ATRIUM              Index        RIGHT ATRIUM           Index LA diam:        5.30 cm  2.24 cm/m   RA Area:     24.00 cm LA Vol (A2C):   111.0 ml 47.01 ml/m  RA Volume:   72.50 ml  30.70 ml/m LA Vol (A4C):   134.0 ml 56.75 ml/m LA Biplane Vol: 131.0 ml 55.48 ml/m  AORTIC VALVE AV Area (Vmax):    3.11 cm AV Area (Vmean):   2.96 cm AV Area (VTI):     3.07 cm AV Vmax:           202.25 cm/s AV Vmean:          133.000 cm/s AV VTI:            0.436 m AV Peak Grad:      16.4 mmHg AV Mean Grad:      8.5 mmHg LVOT Vmax:         102.00 cm/s LVOT Vmean:        63.900  cm/s LVOT VTI:          0.217 m LVOT/AV VTI ratio: 0.50  AORTA Ao Root diam: 3.20 cm Ao Asc diam:  3.30 cm MITRAL VALVE                TRICUSPID VALVE MV Area (PHT): 5.13 cm     TR Peak grad:   33.6 mmHg MV Peak grad:  11.5 mmHg    TR Vmax:        290.00 cm/s MV Vmax:       1.69 m/s MV Decel Time: 148 msec     SHUNTS MV E velocity: 169.00 cm/s  Systemic VTI:  0.22 m                             Systemic Diam: 2.80 cm Carl Chiquito MD Electronically signed by Carl Chiquito MD Signature Date/Time: 04/02/2022/3:15:27 PM    Final      Medications:     Scheduled Medications:  allopurinol  100 mg Oral Daily   apixaban  5 mg Oral BID   buPROPion  150 mg Oral BID   dapagliflozin propanediol  10 mg Oral QAC breakfast   febuxostat  120 mg Oral Daily   gabapentin  300 mg Oral BID   hydrALAZINE  12.5 mg Oral Q8H   insulin aspart  0-15 Units Subcutaneous TID WC   isosorbide mononitrate  30 mg Oral Daily   montelukast  10 mg Oral QHS   mycophenolate  360 mg Oral BID   pantoprazole  40 mg Oral Daily   predniSONE  5 mg Oral Daily   rosuvastatin  10 mg Oral Daily   sodium chloride flush  3 mL  Intravenous Q12H   sulfamethoxazole-trimethoprim  1 tablet Oral Q M,W,F   tacrolimus  2 mg Oral BID   tamsulosin  0.4 mg Oral Daily    Infusions:  sodium chloride     furosemide 120 mg (04/02/22 1830)    PRN Medications: sodium chloride, acetaminophen, albuterol, colchicine, melatonin, sodium chloride flush    Patient Profile   62 y/o male w/ chronic systolic heart failure, mid-range EF, prominent RV failure/ PH, CAD s/p CABG in 2005, chronic rate controlled Afib, stage III CKD s/p renal transplant in 2012, OSA on CPAP, HTN, Aortic stenosis and mitral stenosis, direct admitted from clinic for a/c CHF w/ NYHA Class IIIb symptoms and failed response to escalation of outpatient diuretic regimen.   Assessment/Plan   1. Acute on chronic heart failure with Mid Range EF/ With prominent RV failure/pulmonary hypertension:  Ischemic cardiomyopathy.  ?If RV failure is related to severe OSA, he was a remote smoker. Echo in 2/21 with EF 45-50%, moderately dilated RV with moderately decreased systolic function, moderate pulmonary hypertension. Echo in 8/23 showed EF 40-45%, diffuse hypokinesis with dyssynchrony, mildly dilated RV with mildly decreased systolic function, IVC dilated, moderate AS with mean gradient 15, mild-moderate mitral stenosis with mean gradient 6.  Admitted w/ worsening NYHA IIIb-IV symptoms, with massive volume overload, failed Furoscix. Weight up 25-30 lbs, ReDs 42% on admit. Echo this admit EF 40-45% , RV mod reduced, RVSP 49 mmHg. IVC dilated.  - Diuresing w/ high dose lasix and metolazone. SCr up slightly from baseline but has been stable through this admission. Remains fluid overloaded. - Continue diuresis today. 120 IV Lasix BID + 5 mg metolazone.  - Plan RHC on Friday  - Continue Farxiga 10 mg daily. -  Continue hydralazine 12.5 mg tid for afterload reduction.  - He is now off ARB and spironolactone with elevated creatinine.   - Stopped tadalafil (think PH is group 2,3).  Imdur initiated this admit, 30 mg daily.  2. Atrial fibrillation: Chronic. Rate controlled.  - Continue Eliquis. - Not on beta blocker with history of bradycardia.   3. CAD: S/p CABG 2005.  Cardiolite 12/22 with prior MI, no ischemia.  No further chest pain. High threshold for cath given CKD.  - No ASA given Eliquis use.  - Continue Crestor 10 mg daily, good lipids 2/23. 4. CKD Stage 3: S/p renal transplantation in 2012. Followed by transplant center at [facility]. He remains on mycophenolate and tacrolimus.   - tacrolimus level pending  5. OSA: Continue Bipap every night.  6. HTN: Controlled on current regimen  7. DM 2: On insulin. He follows with Endocrinology. - Continue dapagliflozin for now. 8. Pulmonary hypertension: Severe pulmonary hypertension by 3/20 echo.  RHC in 2/21 with moderate, mixed pulmonary venous/pulmonary arterial hypertension (PVR 3.25 WU). Suspect group 2 and group 3 PH (OSA, ? unrecognized OHS).  - Using BiPAP nightly. - Stop tadalafil and use Imdur as above.  9. Aortic stenosis: Moderate on echo 8/23.  10. Mitral stenosis: Mild to moderate on recent echo. Suspect due to calcific degeneration, not rheumatic heart disease.  11. Chronic Anemia: has been getting scheduled weekly IV Fe infusions, qFridays - will plan IV iron 1/12   Length of Stay: 2  Carl Jester, PA-C  04/03/2022, 9:32 AM  Advanced Heart Failure Team Pager [phone] (M-F; 7a - 5p)  Please contact [facility] for night-coverage after hours (5p -7a ) and weekends on [email or web address]  Patient seen with PA, agree with the above note.   He is diuresing well, weight down 5 lbs.  No complaints this morning.   General: NAD Neck: JVP 14 cm, no thyromegaly or thyroid nodule.  Lungs: Clear to auscultation bilaterally with normal respiratory effort. CV: Nondisplaced PMI.  Heart irregular S1/S2, no S3/S4, no murmur.  1+ chronic edema to knees.  Abdomen: Soft, nontender, no hepatosplenomegaly, no  distention.  Skin: Intact without lesions or rashes.  Neurologic: Alert and oriented x 3.  Psych: Normal affect. Extremities: No clubbing or cyanosis.  HEENT: Normal.   Still volume overloaded on exam.  He has diuresed well, BUN and creatinine are staying stable in the 2.4 range. We have taken him off tadalafil and placed him on Imdur 30 daily (in addition to his hydralazine).  - Continue Lasix IV + metolazone today, same dosing as yesterday.  - RHC probably Friday. Assess filling pressures and also assess PA pressure (suspect primarily pulmonary venous hypertension). Discussed risks/benefits with patient and he agrees to procedure.  - He is willing to try unna boots.   Continue Eliquis for chronic atrial fibrillation.   [name] 04/03/2022 10:20 AM

## 2022-04-04 ENCOUNTER — Inpatient Hospital Stay (HOSPITAL_COMMUNITY): Disposition: A | Discharge status: Home / Self Care | Payer: Self-pay | Source: Ambulatory Visit | Attending: Cardiology

## 2022-04-04 ENCOUNTER — Other Ambulatory Visit: Payer: Self-pay

## 2022-04-04 DIAGNOSIS — I5023 Acute on chronic systolic (congestive) heart failure: Secondary | ICD-10-CM | POA: Diagnosis not present

## 2022-04-04 DIAGNOSIS — I509 Heart failure, unspecified: Secondary | ICD-10-CM

## 2022-04-04 HISTORY — PX: RIGHT HEART CATH: CATH118263

## 2022-04-04 LAB — BASIC METABOLIC PANEL
Anion gap: 12 (ref 5–15)
BUN: 87 mg/dL — ABNORMAL HIGH (ref 8–23)
CO2: 34 mmol/L — ABNORMAL HIGH (ref 22–32)
Calcium: 9.1 mg/dL (ref 8.9–10.3)
Chloride: 93 mmol/L — ABNORMAL LOW (ref 98–111)
Creatinine, Ser: 2.69 mg/dL — ABNORMAL HIGH (ref 0.61–1.24)
GFR, Estimated: 26 mL/min — ABNORMAL LOW (ref >60)
Glucose, Bld: 216 mg/dL — ABNORMAL HIGH (ref 70–99)
Potassium: 3.5 mmol/L (ref 3.5–5.1)
Sodium: 139 mmol/L (ref 135–145)

## 2022-04-04 LAB — POCT I-STAT EG7
Acid-Base Excess: 12 mmol/L — ABNORMAL HIGH (ref 0.0–2.0)
Acid-Base Excess: 12 mmol/L — ABNORMAL HIGH (ref 0.0–2.0)
Bicarbonate: 37.8 mmol/L — ABNORMAL HIGH (ref 20.0–28.0)
Bicarbonate: 37.8 mmol/L — ABNORMAL HIGH (ref 20.0–28.0)
Calcium, Ion: 1.16 mmol/L (ref 1.15–1.40)
Calcium, Ion: 1.17 mmol/L (ref 1.15–1.40)
HCT: 31 % — ABNORMAL LOW (ref 39.0–52.0)
HCT: 32 % — ABNORMAL LOW (ref 39.0–52.0)
Hemoglobin: 10.5 g/dL — ABNORMAL LOW (ref 13.0–17.0)
Hemoglobin: 10.9 g/dL — ABNORMAL LOW (ref 13.0–17.0)
O2 Saturation: 57 %
O2 Saturation: 61 %
Potassium: 3.4 mmol/L — ABNORMAL LOW (ref 3.5–5.1)
Potassium: 3.4 mmol/L — ABNORMAL LOW (ref 3.5–5.1)
Sodium: 141 mmol/L (ref 135–145)
Sodium: 143 mmol/L (ref 135–145)
TCO2: 39 mmol/L — ABNORMAL HIGH (ref 22–32)
TCO2: 39 mmol/L — ABNORMAL HIGH (ref 22–32)
pCO2, Ven: 53.5 mmHg (ref 44–60)
pCO2, Ven: 53.5 mmHg (ref 44–60)
pH, Ven: 7.457 — ABNORMAL HIGH (ref 7.25–7.43)
pH, Ven: 7.458 — ABNORMAL HIGH (ref 7.25–7.43)
pO2, Ven: 29 mmHg — CL (ref 32–45)
pO2, Ven: 31 mmHg — CL (ref 32–45)

## 2022-04-04 LAB — GLUCOSE, CAPILLARY
Glucose-Capillary: 185 mg/dL — ABNORMAL HIGH (ref 70–99)
Glucose-Capillary: 228 mg/dL — ABNORMAL HIGH (ref 70–99)
Glucose-Capillary: 180 mg/dL — ABNORMAL HIGH (ref 70–99)

## 2022-04-04 LAB — TACROLIMUS LEVEL: Tacrolimus (FK506) - LabCorp: 12.5 ng/mL (ref 2.0–20.0)

## 2022-04-04 SURGERY — RIGHT HEART CATH
Anesthesia: LOCAL

## 2022-04-04 MED ORDER — FUROSEMIDE 10 MG/ML IJ SOLN: 10.0000 mg/h | INTRAVENOUS | Status: DC

## 2022-04-04 MED ORDER — ACETAZOLAMIDE 250 MG PO TABS
250.0000 mg | ORAL_TABLET | Freq: Every day | ORAL | Status: DC
  Administered 2022-04-04 – 2022-04-05 (×2): 250 mg via ORAL
  Filled 2022-04-04 (×2): qty 1

## 2022-04-04 MED ORDER — LIDOCAINE HCL (PF) 1 % IJ SOLN
INTRAMUSCULAR | Status: DC | PRN
  Administered 2022-04-04: 5 mL

## 2022-04-04 MED ORDER — HEPARIN (PORCINE) IN NACL 1000-0.9 UT/500ML-% IV SOLN
INTRAVENOUS | Status: AC
  Filled 2022-04-04: qty 500

## 2022-04-04 MED ORDER — FUROSEMIDE 10 MG/ML IJ SOLN: 80.0000 mg | Freq: Once | INTRAMUSCULAR | Status: DC

## 2022-04-04 MED ORDER — APIXABAN 5 MG PO TABS
5.0000 mg | ORAL_TABLET | Freq: Two times a day (BID) | ORAL | Status: DC
  Administered 2022-04-05 – 2022-04-06 (×3): 5 mg via ORAL
  Filled 2022-04-04 (×3): qty 1

## 2022-04-04 MED ORDER — LIDOCAINE HCL (PF) 1 % IJ SOLN
INTRAMUSCULAR | Status: AC
  Filled 2022-04-04: qty 30

## 2022-04-04 MED ORDER — SODIUM CHLORIDE 0.9 % IV SOLN
200.0000 mg | Freq: Once | INTRAVENOUS | Status: AC
  Administered 2022-04-04: 200 mg via INTRAVENOUS
  Filled 2022-04-04: qty 10

## 2022-04-04 MED ORDER — MYCOPHENOLATE SODIUM 180 MG PO TBEC
360.0000 mg | DELAYED_RELEASE_TABLET | Freq: Two times a day (BID) | ORAL | 3 refills | Status: DC
  Filled 2022-04-04: qty 360, 90d supply, fill #0
  Filled 2022-04-17 – 2022-07-25 (×2): qty 360, 90d supply, fill #1
  Filled 2022-12-20: qty 360, 90d supply, fill #2
  Filled 2023-01-07 – 2023-01-17 (×2): qty 360, 90d supply, fill #3
  Filled 2023-03-12: qty 120, 30d supply, fill #3
  Filled 2023-03-12: qty 240, 60d supply, fill #3

## 2022-04-04 MED ORDER — HEPARIN (PORCINE) IN NACL 1000-0.9 UT/500ML-% IV SOLN
INTRAVENOUS | Status: DC | PRN
  Administered 2022-04-04: 500 mL

## 2022-04-04 MED ORDER — VERAPAMIL HCL 2.5 MG/ML IV SOLN
INTRAVENOUS | Status: AC
  Filled 2022-04-04: qty 2

## 2022-04-04 MED ORDER — POTASSIUM CHLORIDE CRYS ER 20 MEQ PO TBCR
40.0000 meq | EXTENDED_RELEASE_TABLET | Freq: Once | ORAL | Status: AC
  Administered 2022-04-04: 40 meq via ORAL
  Filled 2022-04-04: qty 2

## 2022-04-04 MED ORDER — FUROSEMIDE 10 MG/ML IJ SOLN
120.0000 mg | Freq: Two times a day (BID) | INTRAVENOUS | Status: DC
  Administered 2022-04-04 – 2022-04-05 (×2): 120 mg via INTRAVENOUS
  Filled 2022-04-04: qty 2
  Filled 2022-04-04: qty 10
  Filled 2022-04-04: qty 12

## 2022-04-04 SURGICAL SUPPLY — 6 items
CATH BALLN WEDGE 5F 110CM (CATHETERS) IMPLANT
KIT HEART LEFT (KITS) ×1 IMPLANT
PACK CARDIAC CATHETERIZATION (CUSTOM PROCEDURE TRAY) ×1 IMPLANT
SHEATH GLIDE SLENDER 4/5FR (SHEATH) IMPLANT
TRANSDUCER W/STOPCOCK (MISCELLANEOUS) ×1 IMPLANT
WIRE MICROINTRODUCER 60CM (WIRE) IMPLANT

## 2022-04-04 NOTE — Progress Notes (Signed)
Pt on home unit; vital signs stable, will continue to monitor

## 2022-04-04 NOTE — Progress Notes (Addendum)
Advanced Heart Failure Rounding Note  PCP-Cardiologist: [name], MD  [facility]: [name]   Subjective:     Echo this admit EF 40-45% (previously 45-50%), RV mod reduced, RVSP 49 mmHg. IVC dilated.   Last 2 days got 120 IV Lasix BID + 5 of metolazone, -2.4 L UOP, weight unchanged  SCr  2.30>>2.45>2.46>2.69. BUN 87. K 3.5. CO2 34   tacrolimus level 12.5  Feels much better this morning. Ambulating in the halls almost hourly. Denies CP/SOB. Belly doesn't feel as tight per pt report  Objective:   Weight Range: 121.6 kg Body mass index is 39.59 kg/m.   Vital Signs:   Temp:  [97.6 F (36.4 C)-97.9 F (36.6 C)] 97.8 F (36.6 C) (01/11 0426) Pulse Rate:  [63-75] 64 (01/11 0426) Resp:  [17-18] 18 (01/11 0426) BP: (135-156)/(72-83) 135/82 (01/11 0426) SpO2:  [93 %-96 %] 93 % (01/11 0426) Weight:  [121.6 kg] 121.6 kg (01/11 0426)    Weight change: Filed Weights   04/04/22 0017 04/04/22 0116 04/04/22 0426  Weight: 121.6 kg 121.6 kg 121.6 kg    Intake/Output:   Intake/Output Summary (Last 24 hours) at 04/04/2022 0932 Last data filed at 04/04/2022 0100 Gross per 24 hour  Intake --  Output 2425 ml  Net -2425 ml      Physical Exam    General:  well appearing.  No respiratory difficulty HEENT: normal Neck: supple. JVD ~9 cm. Carotids 2+ bilat; no bruits. No lymphadenopathy or thyromegaly appreciated. Cor: PMI nondisplaced. Regular rate & irregular rhythm. No rubs, gallops or murmurs. Lungs: clear Abdomen: soft, nontender, nondistended. No hepatosplenomegaly. No bruits or masses. Good bowel sounds. Extremities: no cyanosis, clubbing, rash, +1 BLE edema, + BLE UNNA boots Neuro: alert & oriented x 3, cranial nerves grossly intact. moves all 4 extremities w/o difficulty. Affect pleasant.    Telemetry   A fib 60s 1-3 PVCs/ hr (Personally reviewed)    EKG    N/A   Labs    CBC Recent Labs    04/01/22 1438  WBC 4.6  HGB 9.1*  HCT 32.8*  MCV 73.7*  PLT  704*   Basic Metabolic Panel Recent Labs    04/03/22 0144 04/04/22 0136  NA 141 139  K 3.6 3.5  CL 97* 93*  CO2 31 34*  GLUCOSE 155* 216*  BUN 79* 87*  CREATININE 2.45* 2.69*  CALCIUM 9.0 9.1   Liver Function Tests Recent Labs    04/01/22 1438  AST 21  ALT 16  ALKPHOS 95  BILITOT 0.6  PROT 6.6  ALBUMIN 3.4*   No results for input(s): "LIPASE", "AMYLASE" in the last 72 hours. Cardiac Enzymes No results for input(s): "CKTOTAL", "CKMB", "CKMBINDEX", "TROPONINI" in the last 72 hours.  BNP: BNP (last 3 results) Recent Labs    12/07/21 1038 03/27/22 1218 04/01/22 1438  BNP 508.4* 458.6* 419.8*    ProBNP (last 3 results) No results for input(s): "PROBNP" in the last 8760 hours.   D-Dimer No results for input(s): "DDIMER" in the last 72 hours. Hemoglobin A1C No results for input(s): "HGBA1C" in the last 72 hours. Fasting Lipid Panel No results for input(s): "CHOL", "HDL", "LDLCALC", "TRIG", "CHOLHDL", "LDLDIRECT" in the last 72 hours. Thyroid Function Tests Recent Labs    04/01/22 1438  TSH 2.828    Other results:   Imaging    No results found.   Medications:     Scheduled Medications:  allopurinol  100 mg Oral Daily   apixaban  5 mg Oral BID   buPROPion  150 mg Oral BID   dapagliflozin propanediol  10 mg Oral QAC breakfast   febuxostat  120 mg Oral Daily   gabapentin  300 mg Oral BID   hydrALAZINE  12.5 mg Oral Q8H   insulin aspart  0-15 Units Subcutaneous TID WC   isosorbide mononitrate  30 mg Oral Daily   montelukast  10 mg Oral QHS   mycophenolate  360 mg Oral BID   pantoprazole  40 mg Oral Daily   predniSONE  5 mg Oral Daily   rosuvastatin  10 mg Oral Daily   sodium chloride flush  3 mL Intravenous Q12H   sodium chloride flush  3 mL Intravenous Q12H   sulfamethoxazole-trimethoprim  1 tablet Oral Q M,W,F   tacrolimus  2 mg Oral BID   tamsulosin  0.4 mg Oral Daily    Infusions:  sodium chloride     furosemide 120 mg (04/04/22  0845)    PRN Medications: sodium chloride, acetaminophen, albuterol, colchicine, melatonin, sodium chloride flush    Patient Profile   62 y/o male w/ chronic systolic heart failure, mid-range EF, prominent RV failure/ PH, CAD s/p CABG in 2005, chronic rate controlled Afib, stage III CKD s/p renal transplant in 2012, OSA on CPAP, HTN, Aortic stenosis and mitral stenosis, direct admitted from clinic for a/c CHF w/ NYHA Class IIIb symptoms and failed response to escalation of outpatient diuretic regimen.   Assessment/Plan   1. Acute on chronic heart failure with Mid Range EF/ With prominent RV failure/pulmonary hypertension:  Ischemic cardiomyopathy.  ?If RV failure is related to severe OSA, he was a remote smoker. Echo in 2/21 with EF 45-50%, moderately dilated RV with moderately decreased systolic function, moderate pulmonary hypertension. Echo in 8/23 showed EF 40-45%, diffuse hypokinesis with dyssynchrony, mildly dilated RV with mildly decreased systolic function, IVC dilated, moderate AS with mean gradient 15, mild-moderate mitral stenosis with mean gradient 6.  Admitted w/ worsening NYHA IIIb-IV symptoms, with massive volume overload, failed Furoscix. Weight up 25-30 lbs, ReDs 42% on admit. Echo this admit EF 40-45% , RV mod reduced, RVSP 49 mmHg. IVC dilated. Volume remains elevated. - Volume appears slightly better. Will hold further diuresis today with worsening kidney function, UOP slowing and weight unchanged.   - Plan RHC originally on Friday, placed on add-on board for today.  - Continue Farxiga 10 mg daily. - Continue hydralazine 12.5 mg tid for afterload reduction.  - He is now off ARB and spironolactone with elevated creatinine.   - Stopped tadalafil (think PH is group 2,3). Imdur initiated this admit, 30 mg daily.  2. Atrial fibrillation: Chronic. Rate controlled.  - Continue Eliquis. - Not on beta blocker with history of bradycardia.   3. CAD: S/p CABG 2005.  Cardiolite 12/22  with prior MI, no ischemia.  No further chest pain. High threshold for cath given CKD.  - No ASA given Eliquis use.  - Continue Crestor 10 mg daily, good lipids 2/23. 4. CKD Stage 3: S/p renal transplantation in 2012. Followed by transplant center at [facility]. He remains on mycophenolate and tacrolimus.   - Bump in cr with diuresis 2.45>2.69, holding diuretics as above - tacrolimus level 12.5 5. OSA: Continue Bipap every night.  6. HTN: Controlled on current regimen  7. DM 2: On insulin. He follows with Endocrinology. - Continue dapagliflozin for now. 8. Pulmonary hypertension: Severe pulmonary hypertension by 3/20 echo.  RHC in 2/21 with moderate, mixed pulmonary  venous/pulmonary arterial hypertension (PVR 3.25 WU). Suspect group 2 and group 3 PH (OSA, ? unrecognized OHS).  - Using BiPAP nightly. - Now off tadalafil and use Imdur as above.  9. Aortic stenosis: Moderate on echo 8/23.  10. Mitral stenosis: Mild to moderate on recent echo. Suspect due to calcific degeneration, not rheumatic heart disease.  11. Chronic Anemia: has been getting scheduled weekly IV Fe infusions, qFridays - will plan IV iron 1/12   Length of Stay: [place], NP  04/04/2022, 9:32 AM  Advanced Heart Failure Team Pager [phone] (M-F; 7a - 5p)  Please contact [facility] for night-coverage after hours (5p -7a ) and weekends on [email or web address]  Patient seen with NP, agree with the above note.   Creatinine up 2.45 => 2.69, diuretics held this morning.  RHC done today, see below.   RHC Procedural Findings: Hemodynamics (mmHg) RA mean 13 RV 64/14 PA 62/24, mean 40 PCWP mean 20 Oxygen saturations: PA 59% AO 92% Cardiac Output (Fick) 7.63  Cardiac Index (Fick) 3.26 PVR 2.6 WU PAPi 2.9   General: NAD Neck: JVP 14 cm, no thyromegaly or thyroid nodule.  Lungs: Clear to auscultation bilaterally with normal respiratory effort. CV: Nondisplaced PMI.  Heart irregular S1/S2, no S3/S4, no murmur.  1+  edema to knees.  Abdomen: Soft, nontender, no hepatosplenomegaly, no distention.  Skin: Intact without lesions or rashes.  Neurologic: Alert and oriented x 3.  Psych: Normal affect. Extremities: No clubbing or cyanosis.  HEENT: Normal.   Elevated right and left heart filling pressures still on RHC, primarily pulmonary venous hypertension.  Diuretics were held this morning, I will resume tonight with Lasix 120 mg IV bid + acetazolamide 250 mg po x 1. Would like to continue at least a couple more IV Lasix doses depending on how creatinine looks tomorrow morning.   Loralie Champagne 04/04/2022 5:17 PM

## 2022-04-04 NOTE — Interval H&P Note (Signed)
History and Physical Interval Note:  04/04/2022 4:28 PM  [name].  has presented today for surgery, with the diagnosis of pulmonary htn, hf.  The various methods of treatment have been discussed with the patient and family. After consideration of risks, benefits and other options for treatment, the patient has consented to  Procedure(s): RIGHT HEART CATH (N/A) as a surgical intervention.  The patient's history has been reviewed, patient examined, no change in status, stable for surgery.  I have reviewed the patient's chart and labs.  Questions were answered to the patient's satisfaction.     [name] [employer]

## 2022-04-04 NOTE — Inpatient Diabetes Management (Signed)
Inpatient Diabetes Program Recommendations  AACE/ADA: New Consensus Statement on Inpatient Glycemic Control (2015)  Target Ranges:  Prepandial:   less than 140 mg/dL      Peak postprandial:   less than 180 mg/dL (1-2 hours)      Critically ill patients:  140 - 180 mg/dL   Lab Results  Component Value Date   GLUCAP 185 (H) 04/04/2022   HGBA1C 6.1 (H) 11/02/2021    Review of Glycemic Control  Latest Reference Range & Units 04/03/22 08:42 04/03/22 12:17 04/03/22 15:47 04/03/22 21:39 04/04/22 08:15  Glucose-Capillary 70 - 99 mg/dL 159 (H) 191 (H) 205 (H) 265 (H) 185 (H)  (H): Data is abnormally high  Diabetes history: DM2 Outpatient Diabetes medications: Semglee 35 units QD, Humalog 10-20 units TID, Farxiga 10 mg QD Current orders for Inpatient glycemic control: Novolog 0-15 units TID, Farxiga 10 mg QD, Prednisone 5 mg QD  Inpatient Diabetes Program Recommendations:    Please consider: Novolog 0-5 units QHS  Will continue to follow while inpatient.  Thank you, Reche Dixon, MSN, [place] Diabetes Coordinator Inpatient Diabetes Program [phone] (team pager from 8a-5p)

## 2022-04-05 ENCOUNTER — Other Ambulatory Visit: Payer: Self-pay

## 2022-04-05 ENCOUNTER — Encounter (HOSPITAL_COMMUNITY): Payer: Self-pay | Admitting: Cardiology

## 2022-04-05 DIAGNOSIS — I5023 Acute on chronic systolic (congestive) heart failure: Secondary | ICD-10-CM | POA: Diagnosis not present

## 2022-04-05 LAB — BASIC METABOLIC PANEL
Anion gap: 13 (ref 5–15)
BUN: 87 mg/dL — ABNORMAL HIGH (ref 8–23)
CO2: 34 mmol/L — ABNORMAL HIGH (ref 22–32)
Calcium: 8.9 mg/dL (ref 8.9–10.3)
Chloride: 91 mmol/L — ABNORMAL LOW (ref 98–111)
Creatinine, Ser: 2.76 mg/dL — ABNORMAL HIGH (ref 0.61–1.24)
GFR, Estimated: 25 mL/min — ABNORMAL LOW (ref >60)
Glucose, Bld: 281 mg/dL — ABNORMAL HIGH (ref 70–99)
Potassium: 3.2 mmol/L — ABNORMAL LOW (ref 3.5–5.1)
Sodium: 138 mmol/L (ref 135–145)

## 2022-04-05 LAB — GLUCOSE, CAPILLARY
Glucose-Capillary: 158 mg/dL — ABNORMAL HIGH (ref 70–99)
Glucose-Capillary: 223 mg/dL — ABNORMAL HIGH (ref 70–99)
Glucose-Capillary: 170 mg/dL — ABNORMAL HIGH (ref 70–99)
Glucose-Capillary: 342 mg/dL — ABNORMAL HIGH (ref 70–99)
Glucose-Capillary: 210 mg/dL — ABNORMAL HIGH (ref 70–99)

## 2022-04-05 MED ORDER — SODIUM CHLORIDE 0.9 % IV SOLN: INTRAVENOUS | Status: DC

## 2022-04-05 MED ORDER — POTASSIUM CHLORIDE CRYS ER 10 MEQ PO TBCR
40.0000 meq | EXTENDED_RELEASE_TABLET | Freq: Once | ORAL | Status: AC
  Administered 2022-04-05: 40 meq via ORAL
  Filled 2022-04-05: qty 4

## 2022-04-05 MED ORDER — HYDRALAZINE HCL 25 MG PO TABS
25.0000 mg | ORAL_TABLET | Freq: Three times a day (TID) | ORAL | Status: DC
  Administered 2022-04-05 – 2022-04-06 (×4): 25 mg via ORAL
  Filled 2022-04-05 (×4): qty 1

## 2022-04-05 MED ORDER — SODIUM CHLORIDE 0.9% FLUSH: 3.0000 mL | INTRAVENOUS | Status: DC | PRN

## 2022-04-05 MED ORDER — SODIUM CHLORIDE 0.9 % IV SOLN: 250.0000 mL | INTRAVENOUS | Status: DC | PRN

## 2022-04-05 MED FILL — Verapamil HCl IV Soln 2.5 MG/ML: INTRAVENOUS | Qty: 2 | Status: AC

## 2022-04-05 NOTE — Progress Notes (Addendum)
Advanced Heart Failure Rounding Note  PCP-Cardiologist: [name], MD  [facility]: [name]   Subjective:   Echo this admit EF 40-45% (previously 45-50%), RV mod reduced, RVSP 49 mmHg. IVC dilated.   Diuretics briefly held yesterday with bump in Cr. Restarted once RHC complete. -4.5 L UOP. Weight unchanged.   SCr  2.30>>2.45>2.46>2.69>2.76. BUN 87. K 3.2. CO2 34   tacrolimus level 12.5> being redrawn  Feels good this morning. Able to ambulate w/o SOB. Denies CP.  RHC Procedural Findings: 1/11 Hemodynamics (mmHg) RA mean 13 RV 64/14 PA 62/24, mean 40 PCWP mean 20 Oxygen saturations: PA 59% AO 92% Cardiac Output (Fick) 7.63  Cardiac Index (Fick) 3.26 PVR 2.6 WU PAPi 2.9  Elevated right and left heart filling pressures still on RHC, primarily pulmonary venous hypertension.  Objective:   Weight Range: 121.6 kg Body mass index is 39.59 kg/m.   Vital Signs:   Temp:  [97.6 F (36.4 C)] 97.6 F (36.4 C) (01/12 0416) Pulse Rate:  [0-101] 61 (01/12 0416) Resp:  [10-27] 19 (01/12 0416) BP: (97-149)/(51-104) 139/80 (01/12 0547) SpO2:  [90 %-100 %] 92 % (01/12 0416) Weight:  [121.6 kg] 121.6 kg (01/12 0228) Last BM Date : 04/04/22  Weight change: Filed Weights   04/04/22 0116 04/04/22 0426 04/05/22 0228  Weight: 121.6 kg 121.6 kg 121.6 kg    Intake/Output:   Intake/Output Summary (Last 24 hours) at 04/05/2022 0726 Last data filed at 04/05/2022 0418 Gross per 24 hour  Intake 690.28 ml  Output 4550 ml  Net -3859.72 ml      Physical Exam    General:  well appearing.  No respiratory difficulty HEENT: normal Neck: supple. JVD ~10 cm. Carotids 2+ bilat; no bruits. No lymphadenopathy or thyromegaly appreciated. Cor: PMI nondisplaced. Regular rate & irregular rhythm. No rubs, gallops or murmurs. Lungs: clear Abdomen: soft, nontender, nondistended. No hepatosplenomegaly. No bruits or masses. Good bowel sounds. Extremities: no cyanosis, clubbing, rash, +1 BLE  edema. + UNNA boots Neuro: alert & oriented x 3, cranial nerves grossly intact. moves all 4 extremities w/o difficulty. Affect pleasant.   Telemetry   A fib 60s 1-2 PVCs/ hr (Personally reviewed)    EKG    N/A   Labs    CBC Recent Labs    04/04/22 1653  HGB 10.5*  10.9*  HCT 31.0*  83.2*   Basic Metabolic Panel Recent Labs    04/04/22 0136 04/04/22 1653 04/05/22 0242  NA 139 141  143 138  K 3.5 3.4*  3.4* 3.2*  CL 93*  --  91*  CO2 34*  --  34*  GLUCOSE 216*  --  281*  BUN 87*  --  87*  CREATININE 2.69*  --  2.76*  CALCIUM 9.1  --  8.9   Liver Function Tests No results for input(s): "AST", "ALT", "ALKPHOS", "BILITOT", "PROT", "ALBUMIN" in the last 72 hours.  No results for input(s): "LIPASE", "AMYLASE" in the last 72 hours. Cardiac Enzymes No results for input(s): "CKTOTAL", "CKMB", "CKMBINDEX", "TROPONINI" in the last 72 hours.  BNP: BNP (last 3 results) Recent Labs    12/07/21 1038 03/27/22 1218 04/01/22 1438  BNP 508.4* 458.6* 419.8*    ProBNP (last 3 results) No results for input(s): "PROBNP" in the last 8760 hours.   D-Dimer No results for input(s): "DDIMER" in the last 72 hours. Hemoglobin A1C No results for input(s): "HGBA1C" in the last 72 hours. Fasting Lipid Panel No results for input(s): "CHOL", "HDL", "LDLCALC", "TRIG", "  CHOLHDL", "LDLDIRECT" in the last 72 hours. Thyroid Function Tests No results for input(s): "TSH", "T4TOTAL", "T3FREE", "THYROIDAB" in the last 72 hours.  Invalid input(s): "FREET3"   Other results:   Imaging    CARDIAC CATHETERIZATION  Result Date: 04/04/2022 1. Elevated filling pressures. 2. Moderate primarily pulmonary venous hypertension. 3. Preserved cardiac output.     Medications:     Scheduled Medications:  acetaZOLAMIDE  250 mg Oral Daily   allopurinol  100 mg Oral Daily   apixaban  5 mg Oral BID   buPROPion  150 mg Oral BID   dapagliflozin propanediol  10 mg Oral QAC breakfast    febuxostat  120 mg Oral Daily   gabapentin  300 mg Oral BID   hydrALAZINE  12.5 mg Oral Q8H   insulin aspart  0-15 Units Subcutaneous TID WC   isosorbide mononitrate  30 mg Oral Daily   montelukast  10 mg Oral QHS   mycophenolate  360 mg Oral BID   pantoprazole  40 mg Oral Daily   predniSONE  5 mg Oral Daily   rosuvastatin  10 mg Oral Daily   sodium chloride flush  3 mL Intravenous Q12H   sodium chloride flush  3 mL Intravenous Q12H   sulfamethoxazole-trimethoprim  1 tablet Oral Q M,W,F   tacrolimus  2 mg Oral BID   tamsulosin  0.4 mg Oral Daily    Infusions:  sodium chloride     furosemide Stopped (04/04/22 1917)    PRN Medications: sodium chloride, acetaminophen, albuterol, colchicine, melatonin, sodium chloride flush    Patient Profile   62 y/o male w/ chronic systolic heart failure, mid-range EF, prominent RV failure/ PH, CAD s/p CABG in 2005, chronic rate controlled Afib, stage III CKD s/p renal transplant in 2012, OSA on CPAP, HTN, Aortic stenosis and mitral stenosis, direct admitted from clinic for a/c CHF w/ NYHA Class IIIb symptoms and failed response to escalation of outpatient diuretic regimen.   Assessment/Plan   1. Acute on chronic heart failure with Mid Range EF/ With prominent RV failure/pulmonary hypertension:  Ischemic cardiomyopathy.  ?If RV failure is related to severe OSA, he was a remote smoker. Echo in 2/21 with EF 45-50%, moderately dilated RV with moderately decreased systolic function, moderate pulmonary hypertension. Echo in 8/23 showed EF 40-45%, diffuse hypokinesis with dyssynchrony, mildly dilated RV with mildly decreased systolic function, IVC dilated, moderate AS with mean gradient 15, mild-moderate mitral stenosis with mean gradient 6.  Admitted w/ worsening NYHA IIIb-IV symptoms, with massive volume overload, failed Furoscix. Weight up 25-30 lbs, ReDs 42% on admit. Echo this admit EF 40-45% , RV mod reduced, RVSP 49 mmHg. IVC dilated. Volume remains  elevated. - Volume elevated, RA 13 yesterday. Lasix 120 mg BID + acetazolamide restarted yesterday, continue through today. Great diuresis so far.  - RHC 1/11: Elevated right and left heart filling pressures still on RHC, primarily pulmonary venous hypertension. - Continue Farxiga 10 mg daily. - Continue hydralazine 12.5 mg tid for afterload reduction.  - He is now off ARB and spironolactone with elevated creatinine.   - Stopped tadalafil (think PH is group 2,3). Imdur initiated this admit, 30 mg daily.  2. Atrial fibrillation: Chronic. Rate controlled.  - Continue Eliquis. - Not on beta blocker with history of bradycardia.   3. CAD: S/p CABG 2005.  Cardiolite 12/22 with prior MI, no ischemia.  No further chest pain. High threshold for cath given CKD.  - No ASA given Eliquis use.  - Continue  Crestor 10 mg daily, good lipids 2/23. 4. CKD Stage 3: S/p renal transplantation in 2012. Followed by transplant center at [facility]. He remains on mycophenolate and tacrolimus.   - Bump in cr with diuresis 2.45>2.69>2.76 - tacrolimus level 12.5, repeat pending 5. OSA: Continue Bipap every night.  6. HTN: Controlled on current regimen  7. DM 2: On insulin. He follows with Endocrinology. - Continue dapagliflozin for now. 8. Pulmonary hypertension: Severe pulmonary hypertension by 3/20 echo.  RHC in 2/21 with moderate, mixed pulmonary venous/pulmonary arterial hypertension (PVR 3.25 WU). Suspect group 2 and group 3 PH (OSA, ? unrecognized OHS).  - Using BiPAP nightly. - Now off tadalafil and use Imdur as above.  9. Aortic stenosis: Moderate on echo 8/23.  10. Mitral stenosis: Mild to moderate on recent echo. Suspect due to calcific degeneration, not rheumatic heart disease.  11. Chronic Anemia: has been getting scheduled weekly IV Fe infusions, qFridays - will plan IV iron today  Length of Stay: [place], NP  04/05/2022, 7:26 AM  Advanced Heart Failure Team Pager [phone] (M-F; 7a - 5p)   Please contact [facility] for night-coverage after hours (5p -7a ) and weekends on [email or web address]  Patient seen with NP, agree with the above note.   Good diuresis overnight, weight down.  Creatinine mildly higher 2.69 => 2.76 with stable BUN.   General: NAD Neck: JVP 8-9 cm, no thyromegaly or thyroid nodule.  Lungs: Clear to auscultation bilaterally with normal respiratory effort. CV: Nondisplaced PMI.  Heart irregular S1/S2, no S3/S4, no murmur.  1+ ankle edema.  Abdomen: Soft, nontender, no hepatosplenomegaly, no distention.  Skin: Intact without lesions or rashes.  Neurologic: Alert and oriented x 3.  Psych: Normal affect. Extremities: No clubbing or cyanosis.  HEENT: Normal.   He has mild residual volume overload, creatinine mildly higher.  Will give 1 dose of Lasix 120 mg IV + acetazolamide 250 mg x 1 this morning.  If creatinine stays stable, will resume po torsemide for home tomorrow.  Hopefully can be discharged tomorrow.   Increase hydralazine to 25 mg tid and continue Imdur.   Loralie Champagne 04/05/2022 10:44 AM

## 2022-04-05 NOTE — Discharge Summary (Incomplete)
Advanced Heart Failure Team  Discharge Summary   Patient ID: [name]. MRN: [id number], DOB/AGE: October 30, 1960 62 y.o. Admit date: 04/01/2022 D/C date:     04/06/2022   Primary Discharge Diagnoses:  Acute on Chronic CHF with Mid Range EF / Prominent RV failure  Pulmonary Hypertension CKD Stage IIIb  s/p renal transplant 2012  Secondary Discharge Diagnoses:  Chronic Atrial Fibrillation CAD s/p CABG 2005 HTN DM 2 Obstructive Sleep Apnea Aortic Stenosis/ Mitral Stenosis Chronic Anemia   Hospital Course: Mr. Francisco is a 62 year old male with a history CAD sp CABG in 2005, ischemic cardiomyopathy/ chronic HFrEF with LVEF as low as 35-40% in the past with subsequent normalization on Echo in 2020 but LVEF 40-45% on last Echo in 10/2021,  RV failure and pulmonary hypertension, chronic atrial fibrillation on Eliquis, aortic stenosis, mitral stenosis, hypertension, type 2 diabetes mellitus on insulin, ESRD s/p renal transplant in 2012 and now CKD stage IIIb, and obstructive sleep apnea on CPAP,  who is followed by [name].  Patient was directedly admitted from the clinic on 04/01/2022 after he presented with dyspnea, chest pressure, and poor urinary output after Furoscix x3 the week prior. He also reported a decreased appetite and abdominal/ bilateral lower extremity edema. Weight was up about 25-30 lbs. ReDs clip was elevated at 42%.    Once admitted he was diuresed with high dose IV Lasix and Acetazolamide. He underwent a RHC due to bump in creatinine. RHC showed elevated right and left heart filling pressures and moderate primarily pulmonary venous hypertension as well as preserved cardiac output. He was transitioned to PO diuretics. Overall symptoms and volume improved and he able to ambulate without shortness of breath.  He has a follow-up appointment in the CHF clinic arranged and will have a repeat BMET checked next week at [facility] to make sure renal function and potassium are stable.  Patient will continue to be followed closely in the CHF clinic. Patient was seen and evaluated by [name] today and deemed appropriate for discharge.   Please see below for detailed problem list:  Acute on Chronic Combine CHF Ischemic Cardiomyopathy Prominent RV Failure Patient has history of ischemic cardiomyopathy with chronic combined CHF with EF as low as 35-40% in the past. EF had normalized to 60-65% on Echo in 05/2018 but has since dropped again. Last Echo in 10/2021 showed LVEF of 40-45% with diffuse hypokinesis with dyssynchrony, mildly dilated RV with mildly decreased systolic function, dilated IVC, moderate aortic stenosis with mean gradient of 15 mmHg, and mild to moderate mitral stenosis with mean gradient 6 mmHg. RV possibly related to severe obstructive sleep apnea. He was admitted with worsening NYHA IIIb-IV symptoms with massive volume overload after failing Furoscix as an outpatient. Weight was up 25-30 lbs and ReDs was elevated at 42% on admission. Echo this admission showed LVEF of 40-45%, moderately reduced RV, and RVSP of 49 mmHg. IVC was dilated. He was aggressively diuresed with IV Lasix and Acetazolamide Diuresis was complicated by cardiorenal syndrome. He underwent RHC on 04/04/2022 which showed elevated filling pressures, moderate primarily pulmonary venous hypertension, and preserved cardiac output. He was diuresed 14 L this admission. Discharge weight 255 lbs (down from 281 lbs on admission).  - Will hold diuresis tomorrow. Tomorrow (04/07/2022), he will restart home Torsemide 167m twice daily. On Monday 04/08/2022, he will restart home Metolazone 2.530mthree times weekly (Monday/Wednesday/Friday). - Home Hydralazine was increased to 2541mhree times daily and he was started on Imdur  51m daily this admission. - Continue Farxiga 163mdaily.  - No ARB or Spironolactone due to renal function. - No beta-blocker due to problems with bradycardia in the past.  - Tadalafil was  stopped as pulmonary hypertension was felt to be WHO Group 2, 3.  - Will recheck BMET next week.  - Close follow-up in clinic arranged.   Chronic Atrial Fibrillation Rate controlled. - Not on a beta blockers with history of bradycardia.  - Continue chronic anticoagulation with Eliquis 75m62mwice daily.   CAD s/p CABG S/p CABG in 2005. Cardiolite in 02/2021 showed prior MI but no ischemic.  He initially noted some chest pressure but this was felt to be due to volume overload. High threshold for left cardiac catheterization given CKD.  - No  Aspirin given need for Eliquis. - Continue home Crestor 10 mg daily.  Hypertension BP mostly well controlled.  - Continue medications for CHF as above.  Type 2 Diabetes Mellitus Hemoglobin A1c 7.9% in 12/2021.  - Continue Farxiga 78m21mily. - Restart home Insulin regimen at discharge. - Followed by Endocrinology as an outpatient.  CKD Stage IIIb History of ESRD s/p renal transplant in 2012. Now with CKD stage IIIb. Baseline creatinine around 2.0 to 2.4. Creatinine up to 2.9 on day of discharge.  - Will hold diuretics today and restart tomorrow as above.  - Remains on Mycophenolate, Tracrolimus, Prednisone, and Bactroban. - [facility] Bi[facility] at night.  Pulmonary Hypertension Primarily venous hypertension by RHC this admission although may be a Group 3 component from obstructive sleep apnea.  - Tadalafil was stopped and he was started on Imdur as above.  Aortic Stenosis Mitral Stenosis Echo in 10/2021 showed moderate aortic stenosis with mean gradient of 15.0 mmHg and mild to moderate mitral stenosis with mean gradient of 6.0 mmHg. Although no mention of aortic stenosis or mitral stenosis on repeat Echo this admission. Can continue to monitor as an outpatient.  Chronic Anemia He has been getting scheduled weekly IV iron infusion every Friday. He was given IV iron this  admission. Continue PO iron supplement at discharge.  Hypokalemia Potassium did drop to 2.9 on day of discharge. Diuretics held for the day and potassium was supplemented. Will continue home potassium regimen at discharge - he was taking 14 tablets of potassium chloride CR 8 mEq twice daily. Will repeat BMET next week to ensure this is stable.   Patient seen and examined by Dr. McLeAundra Dubinay and determined to be stable for discharge. Outpatient follow-up arranged. Medications as below.  Discharge Weight Range: 255 lbs  Discharge Vitals: Blood pressure (!) 128/50, pulse 89, temperature 97.8 F (36.6 C), temperature source Oral, resp. rate 16, height _0  (1.778 m), weight 115.7 kg, SpO2 90 %.  Labs: Lab Results  Component Value Date   WBC 4.6 04/01/2022   HGB 10.9 (L) 04/04/2022   HGB 10.5 (L) 04/04/2022   HCT 32.0 (L) 04/04/2022   HCT 31.0 (L) 04/04/2022   MCV 73.7 (L) 04/01/2022   PLT 116 (L) 04/01/2022    Recent Labs  Lab 04/01/22 1438 04/02/22 0227 04/06/22 0132  NA 139   < > 140  K 3.8   < > 2.9*  CL 95*   < > 93*  CO2 32   < > 32  BUN 75*   < > 87*  CREATININE 2.30*   < > 2.92*  CALCIUM 8.9   < >  9.1  PROT 6.6  --   --   BILITOT 0.6  --   --   ALKPHOS 95  --   --   ALT 16  --   --   AST 21  --   --   GLUCOSE 119*   < > 228*   < > = values in this interval not displayed.   Lab Results  Component Value Date   CHOL 101 03/27/2022   HDL 52 03/27/2022   LDLCALC 35 03/27/2022   TRIG 71 03/27/2022   BNP (last 3 results) Recent Labs    12/07/21 1038 03/27/22 1218 04/01/22 1438  BNP 508.4* 458.6* 419.8*    ProBNP (last 3 results) No results for input(s): "PROBNP" in the last 8760 hours.   Diagnostic Studies/Procedures   No results found.  Discharge Medications   Allergies as of 04/06/2022       Reactions   Contrast Media [iodinated Contrast Media] Other (See Comments)   Kidney transplant   Iodine Other (See Comments)   Other reaction(s): Other  (See Comments) Kidney transplant   Ibuprofen Other (See Comments)   Due to kidney transplant Due to kidney transplant Due to kidney transplant        Medication List     STOP taking these medications    Furoscix 80 MG/10ML Ctkt Generic drug: Furosemide   losartan 50 MG tablet Commonly known as: COZAAR   tadalafil (PAH) 20 MG tablet Commonly known as: ADCIRCA       TAKE these medications    albuterol 108 (90 Base) MCG/ACT inhaler Commonly known as: VENTOLIN HFA Inhale 2 puffs into the lungs every 6 (six) hours as needed for wheezing or shortness of breath.   albuterol (2.5 MG/3ML) 0.083% nebulizer solution Commonly known as: PROVENTIL Inhale one vial (3 ml) via nebulizer every 6 hours as needed for wheezing or shortness of breath (Take 3 mLs (2.5 mg total) by nebulization every 6 (six) hours as needed for wheezing or shortness of breath.)   allopurinol 100 MG tablet Commonly known as: ZYLOPRIM Take 100 mg by mouth daily.   beclomethasone 80 MCG/ACT inhaler Commonly known as: QVAR Inhale 2 puffs into the lungs daily as needed (for shortness of breath).   buPROPion 150 MG 12 hr tablet Commonly known as: WELLBUTRIN SR TAKE 1 TABLET BY MOUTH TWICE DAILY What changed: how much to take   cholecalciferol 25 MCG (1000 UNIT) tablet Commonly known as: VITAMIN D3 Take 1,000 Units by mouth at bedtime.   colchicine 0.6 MG tablet Take 1 tablet (0.6 mg total) by mouth every other day   Eliquis 5 MG Tabs tablet Generic drug: apixaban TAKE 1 TABLET BY MOUTH TWICE DAILY What changed: how much to take   Farxiga 10 MG Tabs tablet Generic drug: dapagliflozin propanediol Take 1 tablet (10 mg total) by mouth daily before breakfast.   febuxostat 40 MG tablet Commonly known as: ULORIC Take 3 tablets (120 mg total) by mouth once daily What changed:  how much to take how to take this when to take this   Ferrex 150 150 MG capsule Generic drug: iron polysaccharides TAKE  1 CAPSULE BY MOUTH 2 TIMES DAILY. What changed:  how much to take how to take this when to take this   fluticasone 50 MCG/ACT nasal spray Commonly known as: FLONASE PLACE 2 SPRAYS INTO BOTH NOSTRILS DAILY.   FreeStyle Libre 3 Sensor Misc Use one every 2 weeks.   gabapentin 300 MG capsule Commonly  known as: NEURONTIN TAKE 1 CAPSULE BY MOUTH TWICE DAILY   HumaLOG KwikPen 100 UNIT/ML KwikPen Generic drug: insulin lispro INJECT UP TO 50 UNITS UNDER THE SKIN DAILY AS DIRECTED (INJECT UP TO 50 UNITS UNDER THE SKIN DAILY AS DIRECTED) What changed:  how much to take when to take this additional instructions   hydrALAZINE 25 MG tablet Commonly known as: APRESOLINE Take 1 tablet (25 mg total) by mouth every 8 (eight) hours. What changed: how much to take   hydrocortisone 2.5 % cream Apply topically 3 (three) times daily as needed.   insulin glargine-yfgn 100 UNIT/ML Pen Commonly known as: SEMGLEE Inject 35 Units subcutaneously once daily What changed:  how much to take when to take this   isosorbide mononitrate 30 MG 24 hr tablet Commonly known as: IMDUR Take 1 tablet (30 mg total) by mouth daily. Start taking on: April 07, 2022   lidocaine 5 % Commonly known as: LIDODERM Place 1 patch onto the skin daily as needed (for pain).   metolazone 2.5 MG tablet Commonly known as: ZAROXOLYN Take 1 tablet by mouth 3 times weekly (Monday, Wednesday, Friday). What changed: additional instructions   montelukast 10 MG tablet Commonly known as: SINGULAIR TAKE 1 TABLET BY MOUTH AT BEDTIME. What changed: how much to take   multivitamin with minerals Tabs tablet Take 1 tablet by mouth daily.   mycophenolate 180 MG EC tablet Commonly known as: MYFORTIC Take 2 tablets (360 mg total) by mouth 2 (two) times daily.   omeprazole 20 MG capsule Commonly known as: PRILOSEC TAKE 1 CAPSULE BY MOUTH DAILY. What changed: how much to take   Potassium Chloride CR 8 MEQ Cpcr capsule  CR Commonly known as: MICRO-K Take 14 capsules (112 mEq total) by mouth 2 (two) times daily.   predniSONE 5 MG tablet Commonly known as: DELTASONE Take 1 tablet (5 mg total) by mouth daily.   rosuvastatin 10 MG tablet Commonly known as: CRESTOR Take 1 tablet (10 mg total) by mouth daily.   sulfamethoxazole-trimethoprim 400-80 MG tablet Commonly known as: BACTRIM TAKE 1 TABLET BY MOUTH EVERY MONDAY, WEDNESDAY, FRIDAY. What changed:  how much to take how to take this when to take this additional instructions   tacrolimus 1 MG capsule Commonly known as: PROGRAF Take 2 capsules (2 mg total) by mouth 2 times daily.   tamsulosin 0.4 MG Caps capsule Commonly known as: FLOMAX TAKE 1 CAPSULE BY MOUTH DAILY.   Testosterone 10 MG/ACT (2%) Gel 1 pump applied to inner thigh daily- 2 pumps total What changed:  how much to take how to take this when to take this additional instructions   torsemide 100 MG tablet Commonly known as: DEMADEX Take 1 tablet (100 mg total) by mouth 2 (two) times daily.   Unifine Pentips 31G X 5 MM Misc Generic drug: Insulin Pen Needle Use 4 times daily as directed   zinc sulfate 220 (50 Zn) MG capsule Take 220 mg by mouth daily.        Disposition   The patient will be discharged in stable condition to home. Discharge Instructions     Diet - low sodium heart healthy   Complete by: As directed    Increase activity slowly   Complete by: As directed        Follow-up Information     [facility] Follow up on 04/17/2022.   Specialty: Cardiology Why: Advanced Heart Failure Clinic 10:30 am Entrance C, [facility] [address]  Forensic scientist information: [address] [id number] [place]        [facility] Follow up.   Specialty: Cardiology Why: Please go and have repeat lab work drawn on Tuesday 04/09/2022 or Wednesday  04/10/2022 at [facility] so we can make sure your kidney function and electrolytes are stable. Contact information: [place] [id number] [address] [place] [phone]                  Duration of Discharge Encounter: Greater than 35 minutes   Signed, ] Sande Rives, PA-C 04/06/2022, 4:52 PM

## 2022-04-05 NOTE — TOC Initial Note (Signed)
Transition of Care (TOC) - Initial/Assessment Note    Patient Details  Name: [name]. MRN: [id number] Date of Birth: 1960/04/02  Transition of Care [facility]) CM/SW Contact:    Erenest Rasher, RN Phone Number: [phone] 04/05/2022, 4:37 PM  Clinical Narrative:                 HF TOC CM spoke to pt and states wife at home to assist with care. He drives to appts. Provided pt with scale for daily weighs. Educated pt on importance of daily weights. Pt has CPAP at home. Will continue to follow for dc needs.   Expected Discharge Plan: Home/Self Care Barriers to Discharge: Continued Medical Work up   Patient Goals and CMS Choice Patient states their goals for this hospitalization and ongoing recovery are:: wants to remain independent          Expected Discharge Plan and Services   Discharge Planning Services: CM Consult   Living arrangements for the past 2 months: Single Family Home                                      Prior Living Arrangements/Services Living arrangements for the past 2 months: Single Family Home Lives with:: Spouse Patient language and need for interpreter reviewed:: Yes Do you feel safe going back to the place where you live?: Yes      Need for Family Participation in Patient Care: No (Comment) Care giver support system in place?: Yes (comment) Current home services: DME (CPAP) Criminal Activity/Legal Involvement Pertinent to Current Situation/Hospitalization: No - Comment as needed  Activities of Daily Living Home Assistive Devices/Equipment: BIPAP, CBG Meter, Eyeglasses, Hearing aid ADL Screening (condition at time of admission) Patient's cognitive ability adequate to safely complete daily activities?: Yes Is the patient deaf or have difficulty hearing?: Yes Does the patient have difficulty seeing, even when wearing glasses/contacts?: No Does the patient have difficulty concentrating, remembering, or making decisions?:  No Patient able to express need for assistance with ADLs?: Yes Does the patient have difficulty dressing or bathing?: No Independently performs ADLs?: Yes (appropriate for developmental age) Does the patient have difficulty walking or climbing stairs?: No Weakness of Legs: None Weakness of Arms/Hands: None  Permission Sought/Granted Permission sought to share information with : Family Supports, PCP, Case Manager Permission granted to share information with : Yes, Verbal Permission Granted  Share Information with NAME: Chay Mazzoni     Permission granted to share info w Relationship: wife  Permission granted to share info w Contact Information: [phone]  Emotional Assessment Appearance:: Appears stated age Attitude/Demeanor/Rapport: Engaged Affect (typically observed): Accepting Orientation: : Oriented to Self, Oriented to Place, Oriented to  Time, Oriented to Situation   Psych Involvement: No (comment)  Admission diagnosis:  Acute on chronic systolic heart failure, NYHA class 3 (HCC) [I50.23] Patient Active Problem List   Diagnosis Date Noted   Acute on chronic systolic heart failure, NYHA class 3 ([place]) 04/01/2022   Internal hemorrhoids 01/21/2022   Type 2 MI (myocardial infarction) ([place]) 11/02/2021   Depression 11/02/2021   Pancreatitis 11/02/2021   Chronic atrial fibrillation (HCC)    PAH (pulmonary artery hypertension) ([place])    Type II diabetes mellitus with renal manifestations ([place])    Obesity with body mass index of 30.0-39.9    Normocytic anemia    Preventative health care 10/30/2021   Mood disorder ([place])  10/30/2021   Morbid obesity ([place]) 10/30/2021   Chronic kidney disease, stage 3b ([place]) 08/15/2021   GERD (gastroesophageal reflux disease) 04/26/2021   Attention or concentration deficit 04/26/2021   Erectile dysfunction due to arterial insufficiency 10/20/2019   Pulmonary hypertension due to left heart disease 01/20/2019   Superficial thrombophlebitis  12/23/2018   Chronic combined systolic and diastolic CHF (congestive heart failure) 11/19/2016   Chronic venous insufficiency 06/05/2016   Proteinuria 06/23/2015   BPH with obstruction/lower urinary tract symptoms 09/25/2014   Hypogonadism in male 09/25/2014   Immunosuppressed status ([place]) 09/09/2014   Type 2 diabetes mellitus with hyperglycemia, with long-term current use of insulin 09/09/2014   Paroxysmal atrial fibrillation ([place]) 07/18/2014   Benign neoplasm of colon 04/22/2013   Diverticulosis of colon 04/22/2013   History of renal transplant 03/01/2013   Asthma, persistent controlled 02/25/2013   Obstructive sleep apnea 09/29/2012   Nocturnal hypoxemia 09/24/2012   Gout 09/13/2012   Obesity hypoventilation syndrome ([place]) 03/31/2012   Essential hypertension 12/17/2010   Hyperlipidemia    CAD (coronary artery disease) 2005   PCP:  [name], MD Pharmacy:   [facility] [place] [address] Phone: [phone] Fax: [phone]  [name] [address] [place] [address] Phone: [phone] Fax: [phone]     Social Determinants of Health (SDOH) Social History: SDOH Screenings   Food Insecurity: No Food Insecurity (04/01/2022)  Housing: Low Risk  (04/01/2022)  Transportation Needs: No Transportation Needs (04/01/2022)  Utilities: Not At Risk (04/01/2022)  Depression (PHQ2-9): Low Risk  (10/30/2021)  Tobacco Use: Medium Risk (04/05/2022)   SDOH Interventions: Housing Interventions: Intervention Not Indicated   Readmission Risk Interventions    11/04/2021   11:44 AM 05/15/2020    2:49 PM 07/15/2019    1:23 PM  Readmission Risk Prevention Plan  Transportation Screening Complete Complete Complete  PCP or Specialist Appt within 3-5 Days  Complete   HRI or [place]  Complete   Social Work Consult for [place] Planning/Counseling  Complete   Palliative  Care Screening  Not Applicable   Medication Review Press photographer) Complete Complete Complete  PCP or Specialist appointment within 3-5 days of discharge Complete  Complete  HRI or [place] Complete    SW Recovery Care/Counseling Consult Complete  Complete  Palliative Care Screening Not Applicable  Not [place] Not Applicable  Not Applicable

## 2022-04-05 NOTE — Inpatient Diabetes Management (Addendum)
Inpatient Diabetes Program Recommendations  AACE/ADA: New Consensus Statement on Inpatient Glycemic Control (2015)  Target Ranges:  Prepandial:   less than 140 mg/dL      Peak postprandial:   less than 180 mg/dL (1-2 hours)      Critically ill patients:  140 - 180 mg/dL   Lab Results  Component Value Date   GLUCAP 223 (H) 04/05/2022   HGBA1C 6.1 (H) 11/02/2021    Review of Glycemic Control  Latest Reference Range & Units 04/04/22 08:15 04/04/22 11:38 04/04/22 17:15 04/04/22 21:29 04/05/22 07:55  Glucose-Capillary 70 - 99 mg/dL 185 (H)  Novolog 3 units 228 (H)  Novolog 5 units 158 (H)  Novolog 3 units 180 (H) 223 (H)  Novolog 5 units   Diabetes history: DM2 Outpatient Diabetes medications: Semglee 35 units QD, Humalog 10-20 units TID, Farxiga 10 mg QD Current orders for Inpatient glycemic control: Novolog 0-15 units TID, Farxiga 10 mg QD, Prednisone 5 mg QD  Inpatient Diabetes Program Recommendations:    Please consider: -   adding Novolog 3 units tid meal coverage as pt is eating at least 50% of meals  Will continue to follow while inpatient.  Thanks,  Tama Headings RN, MSN, BC-ADM Inpatient Diabetes Coordinator Team Pager [phone] (8a-5p)

## 2022-04-06 ENCOUNTER — Other Ambulatory Visit: Payer: Self-pay | Admitting: Student

## 2022-04-06 DIAGNOSIS — I5023 Acute on chronic systolic (congestive) heart failure: Secondary | ICD-10-CM | POA: Diagnosis not present

## 2022-04-06 DIAGNOSIS — N184 Chronic kidney disease, stage 4 (severe): Secondary | ICD-10-CM

## 2022-04-06 DIAGNOSIS — E876 Hypokalemia: Secondary | ICD-10-CM

## 2022-04-06 LAB — BASIC METABOLIC PANEL
Anion gap: 15 (ref 5–15)
BUN: 87 mg/dL — ABNORMAL HIGH (ref 8–23)
CO2: 32 mmol/L (ref 22–32)
Calcium: 9.1 mg/dL (ref 8.9–10.3)
Chloride: 93 mmol/L — ABNORMAL LOW (ref 98–111)
Creatinine, Ser: 2.92 mg/dL — ABNORMAL HIGH (ref 0.61–1.24)
GFR, Estimated: 24 mL/min — ABNORMAL LOW (ref >60)
Glucose, Bld: 228 mg/dL — ABNORMAL HIGH (ref 70–99)
Potassium: 2.9 mmol/L — ABNORMAL LOW (ref 3.5–5.1)
Sodium: 140 mmol/L (ref 135–145)

## 2022-04-06 LAB — GLUCOSE, CAPILLARY
Glucose-Capillary: 206 mg/dL — ABNORMAL HIGH (ref 70–99)
Glucose-Capillary: 290 mg/dL — ABNORMAL HIGH (ref 70–99)

## 2022-04-06 MED ORDER — HYDRALAZINE HCL 25 MG PO TABS: 25.0000 mg | ORAL_TABLET | Freq: Three times a day (TID) | ORAL | 2 refills | Status: DC

## 2022-04-06 MED ORDER — PANTOPRAZOLE SODIUM 40 MG PO TBEC: 40.0000 mg | DELAYED_RELEASE_TABLET | Freq: Every day | ORAL | Status: DC

## 2022-04-06 MED ORDER — POTASSIUM CHLORIDE CRYS ER 20 MEQ PO TBCR: 40.0000 meq | EXTENDED_RELEASE_TABLET | ORAL | Status: DC

## 2022-04-06 MED ORDER — METOLAZONE 2.5 MG PO TABS: ORAL_TABLET | ORAL | 3 refills | Status: DC

## 2022-04-06 MED ORDER — POTASSIUM CHLORIDE ER 8 MEQ PO CPCR: 110.0000 meq | ORAL_CAPSULE | Freq: Two times a day (BID) | ORAL | 6 refills | Status: DC

## 2022-04-06 MED ORDER — ISOSORBIDE MONONITRATE ER 30 MG PO TB24: 30.0000 mg | ORAL_TABLET | Freq: Every day | ORAL | 2 refills | Status: DC

## 2022-04-06 MED ORDER — POTASSIUM CHLORIDE CRYS ER 10 MEQ PO TBCR
40.0000 meq | EXTENDED_RELEASE_TABLET | ORAL | Status: AC
  Administered 2022-04-06 (×2): 40 meq via ORAL
  Filled 2022-04-06 (×2): qty 4

## 2022-04-06 NOTE — Progress Notes (Signed)
Patient ID: Carl Ala., male   DOB: 1960/10/30, 62 y.o.   MRN: [id number]     Advanced Heart Failure Rounding Note  PCP-Cardiologist: [name], MD  [facility]: [name]   Subjective:    Echo this admit EF 40-45% (previously 45-50%), RV mod reduced, RVSP 49 mmHg. IVC dilated.   Creatinine higher at 2.9 today with stable BUN. Weight down another 4 lbs.  Feels great.   RHC Procedural Findings: 1/11 Hemodynamics (mmHg) RA mean 13 RV 64/14 PA 62/24, mean 40 PCWP mean 20 Oxygen saturations: PA 59% AO 92% Cardiac Output (Fick) 7.63  Cardiac Index (Fick) 3.26 PVR 2.6 WU PAPi 2.9  Elevated right and left heart filling pressures still on RHC, primarily pulmonary venous hypertension.  Objective:   Weight Range: 115.7 kg Body mass index is 36.59 kg/m.   Vital Signs:   Temp:  [97.6 F (36.4 C)-98.5 F (36.9 C)] 97.8 F (36.6 C) (01/13 0743) Pulse Rate:  [65-89] 89 (01/13 0743) Resp:  [16-18] 16 (01/13 0743) BP: (111-156)/(50-95) 128/50 (01/13 0743) SpO2:  [90 %-96 %] 90 % (01/13 0743) Weight:  [115.7 kg] 115.7 kg (01/13 0623) Last BM Date : 04/05/22  Weight change: Filed Weights   04/05/22 0228 04/05/22 0741 04/06/22 0623  Weight: 121.6 kg 117.5 kg 115.7 kg    Intake/Output:   Intake/Output Summary (Last 24 hours) at 04/06/2022 1128 Last data filed at 04/06/2022 0630 Gross per 24 hour  Intake 960 ml  Output 2800 ml  Net -1840 ml      Physical Exam    General: NAD Neck: JVP 8 cm, no thyromegaly or thyroid nodule.  Lungs: Clear to auscultation bilaterally with normal respiratory effort. CV: Nondisplaced PMI.  Heart regular S1/S2, no S3/S4, 1/6 SEM RUSB.  No peripheral edema.   Abdomen: Soft, nontender, no hepatosplenomegaly, no distention.  Skin: Intact without lesions or rashes.  Neurologic: Alert and oriented x 3.  Psych: Normal affect. Extremities: No clubbing or cyanosis.  HEENT: Normal.     Telemetry   Atrial fibrillation 60s  (Personally reviewed)    EKG    N/A   Labs    CBC Recent Labs    04/04/22 1653  HGB 10.5*  10.9*  HCT 31.0*  38.7*   Basic Metabolic Panel Recent Labs    04/05/22 0242 04/06/22 0132  NA 138 140  K 3.2* 2.9*  CL 91* 93*  CO2 34* 32  GLUCOSE 281* 228*  BUN 87* 87*  CREATININE 2.76* 2.92*  CALCIUM 8.9 9.1   Liver Function Tests No results for input(s): "AST", "ALT", "ALKPHOS", "BILITOT", "PROT", "ALBUMIN" in the last 72 hours.  No results for input(s): "LIPASE", "AMYLASE" in the last 72 hours. Cardiac Enzymes No results for input(s): "CKTOTAL", "CKMB", "CKMBINDEX", "TROPONINI" in the last 72 hours.  BNP: BNP (last 3 results) Recent Labs    12/07/21 1038 03/27/22 1218 04/01/22 1438  BNP 508.4* 458.6* 419.8*    ProBNP (last 3 results) No results for input(s): "PROBNP" in the last 8760 hours.   D-Dimer No results for input(s): "DDIMER" in the last 72 hours. Hemoglobin A1C No results for input(s): "HGBA1C" in the last 72 hours. Fasting Lipid Panel No results for input(s): "CHOL", "HDL", "LDLCALC", "TRIG", "CHOLHDL", "LDLDIRECT" in the last 72 hours. Thyroid Function Tests No results for input(s): "TSH", "T4TOTAL", "T3FREE", "THYROIDAB" in the last 72 hours.  Invalid input(s): "FREET3"   Other results:   Imaging    No results found.   Medications:  Scheduled Medications:  allopurinol  100 mg Oral Daily   apixaban  5 mg Oral BID   buPROPion  150 mg Oral BID   dapagliflozin propanediol  10 mg Oral QAC breakfast   febuxostat  120 mg Oral Daily   gabapentin  300 mg Oral BID   hydrALAZINE  25 mg Oral Q8H   insulin aspart  0-15 Units Subcutaneous TID WC   isosorbide mononitrate  30 mg Oral Daily   montelukast  10 mg Oral QHS   mycophenolate  360 mg Oral BID   [START ON 04/07/2022] pantoprazole  40 mg Oral Daily   potassium chloride  40 mEq Oral Q4H   predniSONE  5 mg Oral Daily   rosuvastatin  10 mg Oral Daily   sodium chloride flush  3  mL Intravenous Q12H   sulfamethoxazole-trimethoprim  1 tablet Oral Q M,W,F   tacrolimus  2 mg Oral BID   tamsulosin  0.4 mg Oral Daily    Infusions:  sodium chloride      PRN Medications: sodium chloride, acetaminophen, albuterol, colchicine, melatonin, sodium chloride flush    Patient Profile   63 y/o male w/ chronic systolic heart failure, mid-range EF, prominent RV failure/ PH, CAD s/p CABG in 2005, chronic rate controlled Afib, stage III CKD s/p renal transplant in 2012, OSA on CPAP, HTN, Aortic stenosis and mitral stenosis, direct admitted from clinic for a/c CHF w/ NYHA Class IIIb symptoms and failed response to escalation of outpatient diuretic regimen.   Assessment/Plan   1. Acute on chronic heart failure with Mid Range EF/ With prominent RV failure/pulmonary hypertension:  Ischemic cardiomyopathy.  ?If RV failure is related to severe OSA, he was a remote smoker. Echo in 2/21 with EF 45-50%, moderately dilated RV with moderately decreased systolic function, moderate pulmonary hypertension. Echo in 8/23 showed EF 40-45%, diffuse hypokinesis with dyssynchrony, mildly dilated RV with mildly decreased systolic function, IVC dilated, moderate AS with mean gradient 15, mild-moderate mitral stenosis with mean gradient 6.  Admitted w/ worsening NYHA IIIb-IV symptoms, with massive volume overload, failed Furoscix. Weight up 25-30 lbs, ReDs 42% on admit. Echo this admit EF 40-45% , RV mod reduced, RVSP 49 mmHg. IVC dilated.  Diuresis complicated by cardiorenal syndrome but weight down about 19 lbs.  Creatinine up to 2.9 today. Volume status as good as we will get it.  - Hold diuretics today.  Tomorrow, he will start on torsemide 100 mg bid + metolazone 2.5 mg three times/week (similar to prior to admission).  - Continue Farxiga 10 mg daily. - Continue hydralazine 25 mg tid + Imdur 30 daily.  - He is now off ARB and spironolactone with elevated creatinine.   - Stopped tadalafil (think PH is  group 2,3). Imdur initiated this admit, 30 mg daily.  2. Atrial fibrillation: Chronic. Rate controlled.  - Continue Eliquis. - Not on beta blocker with history of bradycardia.   3. CAD: S/p CABG 2005.  Cardiolite 12/22 with prior MI, no ischemia.  No further chest pain. High threshold for cath given CKD.  - No ASA given Eliquis use.  - Continue Crestor 10 mg daily, good lipids 2/23. 4. CKD Stage 3b: S/p renal transplantation in 2012. Followed by transplant center at [facility]. He remains on mycophenolate and tacrolimus.  Creatinine mildly higher today at 2.9.  - Hold diuretics today, restart tomorrow.  5. OSA: Continue Bipap every night.  6. HTN: Controlled on current regimen  7. DM 2: On insulin. He follows  with Endocrinology. - Continue dapagliflozin for now. 8. Pulmonary hypertension: Primarily pulmonary venous hypertension by RHC this admission though may be group 3 PH component from OSA.  - Now off tadalafil and use Imdur as above.  9. Aortic stenosis: No more than mild on last echo.  10. Mitral stenosis: No more than mild on last echo.   11. Chronic Anemia: has been getting scheduled weekly IV Fe infusions, qFridays - will plan IV iron today  He can go home today.  Needs followup in CHF clinic 10 days.  Needs BMET next week at [facility] office. Meds for home: Hydralazine 25 tid, Imdur 30, torsemide 100 mg bid starting Sunday, metolazone 2.5 three times a week start Monday, apixaban 5 bid, dapagliflozin 10, Crestor 10.  Tomorrow (Sunday), resume the same potassium regimen he was taking prior to admission.   Length of Stay: [place], MD  04/06/2022, 11:28 AM  Advanced Heart Failure Team Pager [phone] (M-F; 7a - 5p)  Please contact [facility] for night-coverage after hours (5p -7a ) and weekends on [email or web address]

## 2022-04-06 NOTE — Progress Notes (Signed)
Patient has home cpap for use and will self place.

## 2022-04-06 NOTE — Discharge Instructions (Addendum)
Medication Changes: - START Isosorbide mononitrate 6m daily. - INCREASE Hydralazine 257mthree times daily. - STOP Tadalafil. - RESTART Torsemide 10072mwice daily Sunday 04/07/2022. - RESTART Metolazone 2.5mg25mree times daily Monday 04/08/2022. - Continue all other home medications as prescribed elsewhere on discharge paperwork.  [facility] for repeat lab work to recheck your kidney function and electrolytes.

## 2022-04-08 ENCOUNTER — Other Ambulatory Visit: Payer: Self-pay

## 2022-04-08 LAB — TACROLIMUS LEVEL: Tacrolimus (FK506) - LabCorp: 8.5 ng/mL (ref 2.0–20.0)

## 2022-04-09 ENCOUNTER — Other Ambulatory Visit: Payer: Self-pay

## 2022-04-09 ENCOUNTER — Telehealth (HOSPITAL_COMMUNITY): Payer: Self-pay

## 2022-04-09 NOTE — Telephone Encounter (Signed)
Today contacted [name] to check on him since his hospital stay.  He is aware of cardiology appt and blood work that needs to be done.  He states he is feeling great.  He is taking all his medications and aware what to take.  He states has lost another 5 lbs since discharge.  He denies nay shortness of breath or chest pain.  Advised him to take it easy, ease his self into his projects he likes doing.  He is talking about going ice fishing the end of the month.  He states watching his fluids and high sodium foods.  Will make a home visit between his appts.  Will continue to visit for heart failure, diet and medication management.   [place] emt-Paramedic [phone]

## 2022-04-10 ENCOUNTER — Other Ambulatory Visit (HOSPITAL_COMMUNITY): Payer: Medicare Other

## 2022-04-10 ENCOUNTER — Other Ambulatory Visit
Admission: RE | Admit: 2022-04-10 | Discharge: 2022-04-10 | Disposition: A | Discharge status: Home / Self Care | Payer: Commercial Managed Care - PPO | Attending: Cardiology | Admitting: Cardiology

## 2022-04-10 DIAGNOSIS — N184 Chronic kidney disease, stage 4 (severe): Secondary | ICD-10-CM | POA: Insufficient documentation

## 2022-04-10 DIAGNOSIS — E876 Hypokalemia: Secondary | ICD-10-CM | POA: Diagnosis not present

## 2022-04-10 LAB — BASIC METABOLIC PANEL
Anion gap: 11 (ref 5–15)
BUN: 85 mg/dL — ABNORMAL HIGH (ref 8–23)
CO2: 28 mmol/L (ref 22–32)
Calcium: 9.1 mg/dL (ref 8.9–10.3)
Chloride: 101 mmol/L (ref 98–111)
Creatinine, Ser: 2.06 mg/dL — ABNORMAL HIGH (ref 0.61–1.24)
GFR, Estimated: 36 mL/min — ABNORMAL LOW (ref >60)
Glucose, Bld: 171 mg/dL — ABNORMAL HIGH (ref 70–99)
Potassium: 3.2 mmol/L — ABNORMAL LOW (ref 3.5–5.1)
Sodium: 140 mmol/L (ref 135–145)

## 2022-04-11 DIAGNOSIS — D631 Anemia in chronic kidney disease: Secondary | ICD-10-CM | POA: Diagnosis not present

## 2022-04-11 DIAGNOSIS — N184 Chronic kidney disease, stage 4 (severe): Secondary | ICD-10-CM | POA: Diagnosis not present

## 2022-04-12 ENCOUNTER — Other Ambulatory Visit: Payer: Self-pay

## 2022-04-12 ENCOUNTER — Encounter: Payer: Self-pay | Admitting: Urology

## 2022-04-12 ENCOUNTER — Other Ambulatory Visit (HOSPITAL_COMMUNITY): Payer: Self-pay | Admitting: Cardiology

## 2022-04-12 ENCOUNTER — Other Ambulatory Visit (HOSPITAL_COMMUNITY): Payer: Commercial Managed Care - PPO

## 2022-04-12 ENCOUNTER — Other Ambulatory Visit: Payer: Medicare Other

## 2022-04-15 ENCOUNTER — Telehealth (HOSPITAL_COMMUNITY): Payer: Self-pay

## 2022-04-15 NOTE — Progress Notes (Signed)
04/16/2022 10:32 AM   [name] [birth year] [id number]  Referring provider: [name], MD [address] [place],  [place] [address]  Urological history: 1.Undesired fertility -vasectomy 2016  2. Testosterone deficiency -contributing factors of age, diabetes and obesity -testosterone level pending -Hemoglobin (03/2022) 10.9  3. BPH w/ LU TS - cysto (10/2021) Moderate lateral lobe enlargement with moderate median lobe  prostate - Elevated bladder neck -Moderate trabeculation - TRUS (10/2021) Transrectal Ultrasound performed revealing a 53 gm prostate measuring 4.55 x 4.06 x 5.44 cm (length) -UroLift (12/2021) 6 implants -I PSS 7/2 -PVR 36 mL  4. ED -contributing factors of age, BPH, CAD, DM, history of smoking, testosterone deficiency, depression, obesity, HLD, CKD, sleep apnea and HTN -tadalafil 20 mg, daily -discontinued pulmonary hypertension was felt to be WHO Group 2, 3 and was started on Imdur -SHIM 11  5. Prostate cancer screening -PSA pending -considering TRT  -baseline PSA 0.68 (2014) at age 89  6. Renal transplant -DDRT 08/2010 - followed at [facility] transplant clinic - last seen 10/2021 -non-contrast CT 10/2021 - Severe bilateral renal atrophy is noted consistent with end-stage renal disease. No hydronephrosis or renal obstruction is noted. Urinary bladder is unremarkable. Renal transplant is noted in the right lower quadrant without definite evidence of hydronephrosis.  7. Scrotal mass -scrotal US (10/2021) - 6.2 x 2.6 x 5.7 mm extratesticular tunical lesion at the anteromedial aspect of the superior pole of the right testicle, appears mostly if not completely solid and could be a complex tunical cyst or granuloma. Other etiologies are possible. Consider 6-12 month follow-up ultrasound to ensure stability. There is no positive color flow.  8. Varicocele -scrotal US (10/2021) On the left, there is a tangle of multiple ectatic veins in the  lateral hemiscrotum measuring up to 4.1 mm with Valsalva consistent with a varicocele.  9. Hydroceles -scrotal US (10/2021) Small scrotal hydroceles, both with floating debris.  10. Tubular ectasia -scrotal US (10/2021) Left-greater-than-right tubular ectasia of the rete testis.  Chief Complaint  Patient presents with   Hypogonadism    HPI: [name]. is a 62 y.o. male who presents today for 6 months follow up.   He started back on the gel in December.  He has been having difficulty getting the gel due to supply issues.  He feels that he is experiencing more energy.  I PSS 7/2   PVR 36 mL   He has not urinary complaints at this time.  Patient denies any modifying or aggravating factors.  Patient denies any gross hematuria, dysuria or suprapubic/flank pain.  Patient denies any fevers, chills, nausea or vomiting.     IPSS     Row Name 04/16/22 0900         International Prostate Symptom Score   How often have you had the sensation of not emptying your bladder? Less than 1 in 5     How often have you had to urinate less than every two hours? Less than 1 in 5 times     How often have you found you stopped and started again several times when you urinated? Less than 1 in 5 times     How often have you found it difficult to postpone urination? Less than 1 in 5 times     How often have you had a weak urinary stream? Not at All     How often have you had to strain to start urination? Less than 1  in 5 times     How many times did you typically get up at night to urinate? 2 Times     Total IPSS Score 7       Quality of Life due to urinary symptoms   If you were to spend the rest of your life with your urinary condition just the way it is now how would you feel about that? Mostly Satisfied              Score:  1-7 Mild 8-19 Moderate 20-35 Severe   SHIM 11  Patient still having spontaneous erections.  He denies any pain or curvature with erections.  He is no  longer a candidate for PDE5i's secondary to nitrates.  His erections are not as firm as he would like and he takes a long time to orgasm.  He would also lose his erection during intercourse.    SHIM     Row Name 04/16/22 [phone]         SHIM: Over the last 6 months:   How do you rate your confidence that you could get and keep an erection? Very Low     When you had erections with sexual stimulation, how often were your erections hard enough for penetration (entering your partner)? Most Times (much more than half the time)     During sexual intercourse, how often were you able to maintain your erection after you had penetrated (entered) your partner? A Few Times (much less than half the time)     During sexual intercourse, how difficult was it to maintain your erection to completion of intercourse? Extremely Difficult     When you attempted sexual intercourse, how often was it satisfactory for you? Sometimes (about half the time)       SHIM Total Score   SHIM 11              Score: 1-7 Severe ED 8-11 Moderate ED 12-16 Mild-Moderate ED 17-21 Mild ED 22-25 No ED    PMH: Past Medical History:  Diagnosis Date   Anemia    Aortic atherosclerosis (HCC)    Asthma, persistent controlled 02/25/2013   Attention or concentration deficit 04/26/2021   BPH with obstruction/lower urinary tract symptoms 09/25/2014   CAD (coronary artery disease) 2005   a.) LHC 1996 -> HG stenosis mLAD -> PTCA/PCI with stent x 1 (unk type) -> complicated by CFA pseudoanurysm (required surg);  b.) LHC 08/10/2002  -> 95% LAD, 70% ISR LAD, 80% pOM, 70% mRCA --> CVTS consult; c.) 4v CABG 08/13/2002; c.) 03/2018 MV: Small, fixed inferoapical and apical sep defect. No isc. EF 47% (60-65% by 05/2018 TTE); d.) 02/2021 MV: small Apical inf fixed defect. No isc -> low risk.   Cellulitis and abscess of left leg 07/22/2020   Chronic atrial fibrillation    a.) CHA2DS2VASc = 4 (CHF, HTN, vascular disease history, T2DM);  b.)  rate/rhythm maintained without pharmacological intervention; chronically anticoagulated with apixaban   Chronic combined systolic and diastolic CHF (congestive heart failure)    a.  7/18 Echo: EF 45-50%; b. 05/2018 Echo: EF 60-65%, RVSP 74.8; c. 12/2018 Echo: EF 50-55%. Sev dil LA; d. 04/2019 Echo: EF 45-50%; e. 07/2021 Echo: EF 40-45%, GrIII DD; f. 10/2021 Echo: EF 40-45%, mild conc LVH, septal-lat dyssynchrony (LBBB), mildly reduced RV fxn, mild-mod dil LA, mildly dil RA, mild-mod MS, mod AS.   Chronic venous stasis dermatitis of both lower extremities    Cytomegaloviral disease 2017   Diverticulosis  of colon 04/22/2013   ESRD (end stage renal disease) ([place])    a.) dialysis dependent 2004-2012; b.) s/p cadaveric RIGHT renal transplant 09/13/2010   Essential hypertension 12/17/2010   GERD (gastroesophageal reflux disease)    Gout    History of 2019 novel coronavirus disease (COVID-19) 06/30/2019   History of renal transplant 09/13/2010   a.) s/p cadaveric donor transplant 09/13/2010   Hx of bilateral cataract extraction 10/2017   Hyperlipidemia    Hypogonadism in male 09/25/2014   Ischemic cardiomyopathy    Left cephalic vein thrombosis 67/8938   Long term current use of anticoagulant    a.) apixaban   Long term current use of immunosuppressive drug    a.) mycophenolate + prednisone + tacrolimus   Major depressive disorder 10/04/2013   Mitral stenosis 11/07/2021   a. TTE 11/07/2021: severe MAC, mild-mod MS (MPG 6 mmHg)   Moderate aortic stenosis    a. 10/2021 Echo: Mod AS. AVA 1.06cm^2 (VTI). Mean grad 85mHg.   Obesity hypoventilation syndrome 03/31/2012   OSA treated with BiPAP 09/29/2012   PAH (pulmonary artery hypertension)    a. 04/2019 RHC: RA 19, RV 80/20, PA 78/31 (51), PCWP 25, CO/CI 7.69/3.26. PVR 3.25 WU-->Sev PAH, likely primarily PV HTN.   S/P CABG x 4 08/13/2002   a.) LIMA-LAD, LRA-OM, SVG-D1, SVG-dRCA   S/P gastric bypass    Synovitis of finger 06/11/2019   Trigger  finger, unspecified little finger 12/23/2018   Type 2 diabetes mellitus with hyperglycemia, with long-term current use of insulin 09/09/2014   Ulcer 05/2016   Left shin   Wears glasses     Surgical History: Past Surgical History:  Procedure Laterality Date   CARDIAC CATHETERIZATION N/A 08/10/2002   CATARACT EXTRACTION W/PHACO Right 10/29/2017   Procedure: CATARACT EXTRACTION PHACO AND INTRAOCULAR LENS PLACEMENT ([place]  RIGHT DIABETIC;  Surgeon: BLeandrew Koyanagi MD;  [facility]  Service: Ophthalmology;  Laterality: Right;  Diabetic - insulin   CATARACT EXTRACTION W/PHACO Left 11/18/2017   Procedure: CATARACT EXTRACTION PHACO AND INTRAOCULAR LENS PLACEMENT ([place] LEFT IVA/TOPICAL;  Surgeon: BLeandrew Koyanagi MD;  [facility]  Service: Ophthalmology;  Laterality: Left;  DIABETES - insulin sleep apnea   COLONOSCOPY WITH PROPOFOL N/A 04/22/2013   Procedure: COLONOSCOPY WITH PROPOFOL;  Surgeon: DMilus Banister MD;  [facility];  Service: Endoscopy;  Laterality: N/A;   CORONARY ANGIOPLASTY WITH STENT PLACEMENT N/A 1996   CORONARY ARTERY BYPASS GRAFT N/A 08/13/2002   Procedure: 4v CORONARY ARTERY BYPASS GRAFTING; [facility] Surgeon: CAllegra Lai MD   CYSTOSCOPY WITH INSERTION OF UROLIFT N/A 12/25/2021   Procedure: CYSTOSCOPY WITH INSERTION OF UROLIFT;  Surgeon: SAbbie Sons MD;  Location: [facility];  Service: Urology;  Laterality: N/A;   DG ANGIO AV SHUNT*L*     right and left upper arms   FASCIOTOMY  03/03/2012   Procedure: FASCIOTOMY;  Surgeon: GWynonia Sours MD;  [facility]  Service: Orthopedics;  Laterality: Right;  FASCIOTOMY RIGHT SMALL FINGER   FASCIOTOMY Left 08/17/2013   Procedure: FASCIOTOMY LEFT RING;  Surgeon: GWynonia Sours MD;  [place] [facility]  Service: Orthopedics;  Laterality: Left;   INCISION AND DRAINAGE ABSCESS Left 10/15/2015   Procedure: INCISION AND  DRAINAGE ABSCESS;  Surgeon: DJules Husbands MD;  Location: [facility];  Service: General;  Laterality: Left;   KIDNEY TR[facility]PL[facility]  [facility]13/2010   [facility] 11/15[facility]2016   Procedure: RIGHT  HEART CATH;  Surgeon: [name], MD;  Location: [place] [facility];  Service: Cardiovascular;  Laterality: N/A;   RIGHT HEART CATH N/A 05/03/2019   Procedure: RIGHT HEART CATH;  Surgeon: [name], MD;  Location: [place] [facility];  Service: Cardiovascular;  Laterality: N/A;   RIGHT HEART CATH N/A 04/04/2022   Procedure: RIGHT HEART CATH;  Surgeon: [name], MD;  Location: [place] [facility];  Service: Cardiovascular;  Laterality: N/A;   ROUX-EN-Y GASTRIC BYPASS N/A 2015   TYMPANIC MEMBRANE REPAIR Left 03/2010   VASECTOMY      Home Medications:  Allergies as of 04/16/2022       Reactions   Contrast Media [iodinated Contrast Media] Other (See Comments)   Kidney transplant   Iodine Other (See Comments)   Other reaction(s): Other (See Comments) Kidney transplant   Ibuprofen Other (See Comments)   Due to kidney transplant Due to kidney transplant Due to kidney transplant        Medication List        Accurate as of April 16, 2022 10:32 AM. If you have any questions, ask your nurse or doctor.          albuterol 108 (90 Base) MCG/ACT inhaler Commonly known as: VENTOLIN HFA Inhale 2 puffs into the lungs every 6 (six) hours as needed for wheezing or shortness of breath.   albuterol (2.5 MG/3ML) 0.083% nebulizer solution Commonly known as: PROVENTIL Inhale one vial (3 ml) via nebulizer every 6 hours as needed for wheezing or shortness of breath (Take 3 mLs (2.5 mg total) by nebulization every 6 (six) hours as needed for wheezing or shortness of breath.)   allopurinol 100 MG tablet Commonly known as: ZYLOPRIM Take 100 mg by mouth daily.   beclomethasone 80 MCG/ACT inhaler Commonly known as: QVAR Inhale 2 puffs into the lungs daily as  needed (for shortness of breath).   buPROPion 150 MG 12 hr tablet Commonly known as: WELLBUTRIN SR TAKE 1 TABLET BY MOUTH TWICE DAILY What changed: how much to take   cholecalciferol 25 MCG (1000 UNIT) tablet Commonly known as: VITAMIN D3 Take 1,000 Units by mouth at bedtime.   colchicine 0.6 MG tablet Take 1 tablet (0.6 mg total) by mouth every other day   Eliquis 5 MG Tabs tablet Generic drug: apixaban TAKE 1 TABLET BY MOUTH TWICE DAILY What changed: how much to take   Farxiga 10 MG Tabs tablet Generic drug: dapagliflozin propanediol Take 1 tablet (10 mg total) by mouth daily before breakfast.   febuxostat 40 MG tablet Commonly known as: ULORIC Take 3 tablets (120 mg total) by mouth once daily What changed:  how much to take how to take this when to take this   Ferrex 150 150 MG capsule Generic drug: iron polysaccharides TAKE 1 CAPSULE BY MOUTH 2 TIMES DAILY. What changed:  how much to take how to take this when to take this   fluticasone 50 MCG/ACT nasal spray Commonly known as: FLONASE PLACE 2 SPRAYS INTO BOTH NOSTRILS DAILY.   FreeStyle Libre 3 Sensor Misc Use one every 2 weeks.   gabapentin 300 MG capsule Commonly known as: NEURONTIN TAKE 1 CAPSULE BY MOUTH TWICE DAILY   HumaLOG KwikPen 100 UNIT/ML KwikPen Generic drug: insulin lispro INJECT UP TO 50 UNITS UNDER THE SKIN DAILY AS DIRECTED (INJECT UP TO 50 UNITS UNDER THE SKIN DAILY AS DIRECTED) What changed:  how much to take when to take this additional instructions  hydrALAZINE 25 MG tablet Commonly known as: APRESOLINE Take 1 tablet (25 mg total) by mouth every 8 (eight) hours.   hydrocortisone 2.5 % cream Apply topically 3 (three) times daily as needed.   insulin glargine-yfgn 100 UNIT/ML Pen Commonly known as: SEMGLEE Inject 35 Units subcutaneously once daily What changed:  how much to take when to take this   isosorbide mononitrate 30 MG 24 hr tablet Commonly known as: IMDUR Take  1 tablet (30 mg total) by mouth daily.   lidocaine 5 % Commonly known as: LIDODERM Place 1 patch onto the skin daily as needed (for pain).   metolazone 2.5 MG tablet Commonly known as: ZAROXOLYN Take 1 tablet by mouth 3 times weekly (Monday, Wednesday, Friday).   montelukast 10 MG tablet Commonly known as: SINGULAIR TAKE 1 TABLET BY MOUTH AT BEDTIME. What changed: how much to take   multivitamin with minerals Tabs tablet Take 1 tablet by mouth daily.   mycophenolate 180 MG EC tablet Commonly known as: MYFORTIC Take 2 tablets (360 mg total) by mouth 2 (two) times daily.   omeprazole 20 MG capsule Commonly known as: PRILOSEC TAKE 1 CAPSULE BY MOUTH DAILY. What changed: how much to take   Potassium Chloride CR 8 MEQ Cpcr capsule CR Commonly known as: MICRO-K Take 14 capsules (112 mEq total) by mouth 2 (two) times daily.   predniSONE 5 MG tablet Commonly known as: DELTASONE Take 1 tablet (5 mg total) by mouth daily.   rosuvastatin 10 MG tablet Commonly known as: CRESTOR Take 1 tablet (10 mg total) by mouth daily.   sulfamethoxazole-trimethoprim 400-80 MG tablet Commonly known as: BACTRIM TAKE 1 TABLET BY MOUTH EVERY MONDAY, WEDNESDAY, FRIDAY. What changed:  how much to take how to take this when to take this additional instructions   tacrolimus 1 MG capsule Commonly known as: PROGRAF Take 2 capsules (2 mg total) by mouth 2 times daily.   tamsulosin 0.4 MG Caps capsule Commonly known as: FLOMAX TAKE 1 CAPSULE BY MOUTH DAILY.   Testosterone 10 MG/ACT (2%) Gel 1 pump applied to inner thigh daily- 2 pumps total What changed:  how much to take how to take this when to take this additional instructions   torsemide 100 MG tablet Commonly known as: DEMADEX Take 1 tablet (100 mg total) by mouth 2 (two) times daily.   Unifine Pentips 31G X 5 MM Misc Generic drug: Insulin Pen Needle Use 4 times daily as directed   zinc sulfate 220 (50 Zn) MG capsule Take 220  mg by mouth daily.        Allergies:  Allergies  Allergen Reactions   Contrast Media [Iodinated Contrast Media] Other (See Comments)    Kidney transplant   Iodine Other (See Comments)    Other reaction(s): Other (See Comments) Kidney transplant   Ibuprofen Other (See Comments)    Due to kidney transplant Due to kidney transplant Due to kidney transplant    Family History: Family History  Problem Relation Age of Onset   Heart disease Father    Kidney failure Father    Kidney disease Father    Diabetes Maternal Grandmother    Breast cancer Maternal Grandmother    Valvular heart disease Mother    Liver cancer Paternal Uncle    Liver cancer Paternal Grandmother    Prostate cancer Neg Hx     Social History:  reports that he quit smoking about 27 years ago. His smoking use included cigars. He has never been exposed to  tobacco smoke. He has never used smokeless tobacco. He reports current alcohol use. He reports that he does not use drugs.  ROS: Pertinent ROS in HPI  Physical Exam: BP 134/68   Pulse 76   Ht _0  (1.778 m)   Wt 253 lb (114.8 kg)   BMI 36.30 kg/m   Constitutional:  Well nourished. Alert and oriented, No acute distress. HEENT: [place] AT, moist mucus membranes.  Trachea midline Cardiovascular: No clubbing, cyanosis, or edema. Respiratory: Normal respiratory effort, no increased work of breathing. GU: No CVA tenderness.  No bladder fullness or masses.  Patient with uncircumcised phallus. Foreskin easily retracted  Urethral meatus is patent.  No penile discharge. No penile lesions or rashes.  A large Peyronnie's plaque is palpated along almost the entire dorsal shaft of the penis.  Scrotum without lesions, cysts, rashes and/or edema.  Testicles are located scrotally bilaterally. No masses are appreciated in the testicles. Left and right epididymis are normal. Rectal: Patient with  normal sphincter tone. Anus with multiple condylomas,  No rectal masses are  appreciated. Prostate is approximately 50 grams, no nodules are appreciated. Seminal vesicles could not be palpated. Neurologic: Grossly intact, no focal deficits, moving all 4 extremities. Psychiatric: Normal mood and affect.   Laboratory Data:    Latest Ref Rng & Units 04/10/2022   11:07 AM 04/06/2022    1:32 AM 04/05/2022    2:42 AM  BMP  Glucose 70 - 99 mg/dL 171  228  281   BUN 8 - 23 mg/dL 85  87  87   Creatinine 0.61 - 1.24 mg/dL 2.06  2.92  2.76   Sodium 135 - 145 mmol/L 140  140  138   Potassium 3.5 - 5.1 mmol/L 3.2  2.9  3.2   Chloride 98 - 111 mmol/L 101  93  91   CO2 22 - 32 mmol/L 28  32  34   Calcium 8.9 - 10.3 mg/dL 9.1  9.1  8.9    I have reviewed the labs.   Pertinent Imaging: N/A  Assessment & Plan:    1. Testosterone deficiency -testosterone level pending  2. Testicular lesion -Scrotal ultrasound (10/2021) -  6.2 x 2.6 x 5.7 mm extratesticular tunical lesion at the anteromedial aspect of the superior pole of the right testicle, appears mostly if not completely solid and could be a complex tunical cyst or granuloma. Other etiologies are possible. Consider 6-12 month follow-up ultrasound to ensure stability. There is no positive color flow. -proceed with repeat scrotal US  3. BPH with LU TS -s/p UroLift -PVR demonstrates adequate emptying  4. ED -not a candidate for PDE5i's secondary to nitrate use  5.  Peyronie's disease -Significant parities plaques discovered on today's exam and this may be contributing to his erectile dysfunction -Will schedule him for an erection induction in office so that we can observe any possible curvature and see if is a candidate for Xiaflex injections    Return for Erection induction.  These notes generated with voice recognition software. I apologize for typographical errors.  [place], [place] [address]  [place], [place] [address] [phone]

## 2022-04-15 NOTE — Telephone Encounter (Signed)
Patient aware of labs. He said he has increased weight by 3 pounds since Saturday. He took his Metolazone and Torsemide together this morning and expressed concern that he was not peeing as much as had before when he took these. He indicated he has peed 4 times today, not eaten any salt, and not drinking anything. He has a follow up appointment already scheduled for Wednesday with [facility]. I instructed him to watch his symptoms and if he starts having shortness of  breath, additional weight gain, or swelling to call us back.

## 2022-04-16 ENCOUNTER — Other Ambulatory Visit: Payer: Self-pay

## 2022-04-16 ENCOUNTER — Ambulatory Visit: Payer: Commercial Managed Care - PPO | Admitting: Urology

## 2022-04-16 ENCOUNTER — Encounter: Payer: Self-pay | Admitting: Urology

## 2022-04-16 VITALS — BP 134/68 | HR 76 | Ht 70.0 in | Wt 253.0 lb

## 2022-04-16 DIAGNOSIS — N401 Enlarged prostate with lower urinary tract symptoms: Secondary | ICD-10-CM | POA: Diagnosis not present

## 2022-04-16 DIAGNOSIS — N138 Other obstructive and reflux uropathy: Secondary | ICD-10-CM | POA: Diagnosis not present

## 2022-04-16 DIAGNOSIS — N486 Induration penis plastica: Secondary | ICD-10-CM | POA: Diagnosis not present

## 2022-04-16 DIAGNOSIS — E291 Testicular hypofunction: Secondary | ICD-10-CM

## 2022-04-16 DIAGNOSIS — E349 Endocrine disorder, unspecified: Secondary | ICD-10-CM

## 2022-04-16 DIAGNOSIS — N5089 Other specified disorders of the male genital organs: Secondary | ICD-10-CM | POA: Diagnosis not present

## 2022-04-16 DIAGNOSIS — I2510 Atherosclerotic heart disease of native coronary artery without angina pectoris: Secondary | ICD-10-CM

## 2022-04-16 DIAGNOSIS — N529 Male erectile dysfunction, unspecified: Secondary | ICD-10-CM

## 2022-04-16 LAB — BLADDER SCAN AMB NON-IMAGING: Scan Result: 36

## 2022-04-16 MED FILL — Polysaccharide Iron Complex Cap 150 MG (Iron Equivalent): ORAL | 90 days supply | Qty: 180 | Fill #1 | Status: AC

## 2022-04-17 ENCOUNTER — Other Ambulatory Visit: Payer: Self-pay

## 2022-04-17 ENCOUNTER — Ambulatory Visit (HOSPITAL_COMMUNITY)
Admission: RE | Admit: 2022-04-17 | Discharge: 2022-04-17 | Disposition: A | Discharge status: Home / Self Care | Payer: Commercial Managed Care - PPO | Source: Ambulatory Visit | Attending: Family Medicine | Admitting: Family Medicine

## 2022-04-17 ENCOUNTER — Telehealth: Payer: Self-pay | Admitting: Family Medicine

## 2022-04-17 ENCOUNTER — Encounter (HOSPITAL_COMMUNITY): Payer: Self-pay

## 2022-04-17 VITALS — BP 132/80 | HR 80 | Wt 264.0 lb

## 2022-04-17 DIAGNOSIS — I10 Essential (primary) hypertension: Secondary | ICD-10-CM | POA: Diagnosis not present

## 2022-04-17 DIAGNOSIS — D631 Anemia in chronic kidney disease: Secondary | ICD-10-CM

## 2022-04-17 DIAGNOSIS — E1122 Type 2 diabetes mellitus with diabetic chronic kidney disease: Secondary | ICD-10-CM | POA: Diagnosis not present

## 2022-04-17 DIAGNOSIS — G4733 Obstructive sleep apnea (adult) (pediatric): Secondary | ICD-10-CM | POA: Diagnosis not present

## 2022-04-17 DIAGNOSIS — Z87891 Personal history of nicotine dependence: Secondary | ICD-10-CM | POA: Diagnosis not present

## 2022-04-17 DIAGNOSIS — I252 Old myocardial infarction: Secondary | ICD-10-CM | POA: Insufficient documentation

## 2022-04-17 DIAGNOSIS — I2510 Atherosclerotic heart disease of native coronary artery without angina pectoris: Secondary | ICD-10-CM

## 2022-04-17 DIAGNOSIS — Z940 Kidney transplant status: Secondary | ICD-10-CM | POA: Insufficient documentation

## 2022-04-17 DIAGNOSIS — I255 Ischemic cardiomyopathy: Secondary | ICD-10-CM | POA: Diagnosis not present

## 2022-04-17 DIAGNOSIS — N1832 Chronic kidney disease, stage 3b: Secondary | ICD-10-CM

## 2022-04-17 DIAGNOSIS — I482 Chronic atrial fibrillation, unspecified: Secondary | ICD-10-CM

## 2022-04-17 DIAGNOSIS — I272 Pulmonary hypertension, unspecified: Secondary | ICD-10-CM

## 2022-04-17 DIAGNOSIS — Z7984 Long term (current) use of oral hypoglycemic drugs: Secondary | ICD-10-CM | POA: Diagnosis not present

## 2022-04-17 DIAGNOSIS — Z951 Presence of aortocoronary bypass graft: Secondary | ICD-10-CM | POA: Diagnosis not present

## 2022-04-17 DIAGNOSIS — Z79621 Long term (current) use of calcineurin inhibitor: Secondary | ICD-10-CM | POA: Insufficient documentation

## 2022-04-17 DIAGNOSIS — I132 Hypertensive heart and chronic kidney disease with heart failure and with stage 5 chronic kidney disease, or end stage renal disease: Secondary | ICD-10-CM | POA: Diagnosis not present

## 2022-04-17 DIAGNOSIS — I080 Rheumatic disorders of both mitral and aortic valves: Secondary | ICD-10-CM | POA: Insufficient documentation

## 2022-04-17 DIAGNOSIS — Z79899 Other long term (current) drug therapy: Secondary | ICD-10-CM | POA: Insufficient documentation

## 2022-04-17 DIAGNOSIS — R42 Dizziness and giddiness: Secondary | ICD-10-CM | POA: Diagnosis not present

## 2022-04-17 DIAGNOSIS — I5022 Chronic systolic (congestive) heart failure: Secondary | ICD-10-CM

## 2022-04-17 DIAGNOSIS — Z794 Long term (current) use of insulin: Secondary | ICD-10-CM | POA: Diagnosis not present

## 2022-04-17 DIAGNOSIS — I350 Nonrheumatic aortic (valve) stenosis: Secondary | ICD-10-CM

## 2022-04-17 DIAGNOSIS — N186 End stage renal disease: Secondary | ICD-10-CM | POA: Diagnosis not present

## 2022-04-17 DIAGNOSIS — Z7901 Long term (current) use of anticoagulants: Secondary | ICD-10-CM | POA: Diagnosis not present

## 2022-04-17 DIAGNOSIS — E1121 Type 2 diabetes mellitus with diabetic nephropathy: Secondary | ICD-10-CM

## 2022-04-17 DIAGNOSIS — I050 Rheumatic mitral stenosis: Secondary | ICD-10-CM | POA: Diagnosis not present

## 2022-04-17 DIAGNOSIS — Z79624 Long term (current) use of inhibitors of nucleotide synthesis: Secondary | ICD-10-CM | POA: Insufficient documentation

## 2022-04-17 LAB — BRAIN NATRIURETIC PEPTIDE: B Natriuretic Peptide: 191.5 pg/mL — ABNORMAL HIGH (ref 0.0–100.0)

## 2022-04-17 LAB — BASIC METABOLIC PANEL
Anion gap: 11 (ref 5–15)
BUN: 108 mg/dL — ABNORMAL HIGH (ref 8–23)
CO2: 32 mmol/L (ref 22–32)
Calcium: 9.2 mg/dL (ref 8.9–10.3)
Chloride: 95 mmol/L — ABNORMAL LOW (ref 98–111)
Creatinine, Ser: 2.44 mg/dL — ABNORMAL HIGH (ref 0.61–1.24)
GFR, Estimated: 29 mL/min — ABNORMAL LOW (ref >60)
Glucose, Bld: 273 mg/dL — ABNORMAL HIGH (ref 70–99)
Potassium: 3.8 mmol/L (ref 3.5–5.1)
Sodium: 138 mmol/L (ref 135–145)

## 2022-04-17 LAB — TESTOSTERONE: Testosterone: 470 ng/dL (ref 264–916)

## 2022-04-17 LAB — PSA: Prostate Specific Ag, Serum: 1 ng/mL (ref 0.0–4.0)

## 2022-04-17 MED ORDER — ISOSORBIDE MONONITRATE ER 30 MG PO TB24
15.0000 mg | ORAL_TABLET | Freq: Every day | ORAL | 8 refills | Status: DC
  Filled 2022-04-17: qty 15, 30d supply, fill #0

## 2022-04-17 MED ORDER — POTASSIUM CHLORIDE ER 10 MEQ PO TBCR
60.0000 meq | EXTENDED_RELEASE_TABLET | Freq: Two times a day (BID) | ORAL | 8 refills | Status: DC
  Filled 2022-04-17 (×3): qty 360, 30d supply, fill #0
  Filled 2022-05-30: qty 360, 30d supply, fill #1

## 2022-04-17 NOTE — Addendum Note (Signed)
Encounter addended by: Rafael Bihari, FNP on: 04/17/2022 2:43 PM  Actions taken: Clinical Note Signed

## 2022-04-17 NOTE — Telephone Encounter (Signed)
-----  Message from Nori Riis, PA-C sent at 04/17/2022  7:55 AM EST ----- Please let Mr. Schupp know that his labs were normal.

## 2022-04-17 NOTE — Patient Instructions (Addendum)
Thank you for coming in today  Labs were done today, if any labs are abnormal the clinic will call you No news is good news  Check you blood pressure and log readings   DECREASE Imdur to 15 mg 1/2 tablet daily   TAKE EXTRA Metolazone 2.5 mg totaling 5 mg total with a extra 20 meq of potassium today only  Your physician recommends that you schedule a follow-up appointment in:  2 weeks in clinic  3 months in Dr. Kendall Flack will receive a reminder letter in the mail a few months in advance. If you don't receive a letter, please call our office to schedule the follow-up appointment.    Do the following things EVERYDAY: Weigh yourself in the morning before breakfast. Write it down and keep it in a log. Take your medicines as prescribed Eat low salt foods--Limit salt (sodium) to 2000 mg per day.  Stay as active as you can everyday Limit all fluids for the day to less than 2 liters  At the [facility], you and your health needs are our priority. As part of our continuing mission to provide you with exceptional heart care, we have created designated Provider Care Teams. These Care Teams include your primary Cardiologist (physician) and Advanced Practice Providers (APPs- Physician Assistants and Nurse Practitioners) who all work together to provide you with the care you need, when you need it.   You may see any of the following providers on your designated Care Team at your next follow up: Dr Glori Bickers Dr Loralie Champagne Dr. Roxana Hires, NP Lyda Jester, [place] [facility] [place], [place] Forestine Na, NP Audry Riles, PharmD   Please be sure to bring in all your medications bottles to every appointment.   If you have any questions or concerns before your next appointment please send [name] a message through [place] or call our office at [phone].    TO LEAVE A MESSAGE FOR THE NURSE SELECT OPTION 2, PLEASE LEAVE A MESSAGE INCLUDING: YOUR  NAME DATE OF BIRTH CALL BACK NUMBER REASON FOR CALL**this is important as we prioritize the call backs  YOU WILL RECEIVE A CALL BACK THE SAME DAY AS LONG AS YOU CALL BEFORE 4:00 PM

## 2022-04-17 NOTE — Progress Notes (Addendum)
Advanced Heart Failure Clinic Note   Date:  04/17/2022   ID:  [name]., DOB 01/06/1961, MRN [id number]   Provider location: [address] [place] Type of Visit:  Established patient  PCP:  [name], MD  Cardiologist:  [name] Nephrology: Dr. Ronnald Ramp  HF Cardiologist: [name]  HPI: [name]. is a 62 y.o. male  with a history of CAD s/p CABG 2005, chronic atrial fibrillation on Eliquis, ESRD s/p renal transplant 2012, anc chronic diastolic CHF.  His renal function has been fairly stable, most recent creatinine 1.43.  He has been struggling with volume overload/CHF.  He has a history of ischemic cardiomyopathy with EF as low as 35-40% in the past but echo in 3/20 showed EF 60-65%.  There is evidence for RV failure with pulmonary hypertension by echo.    Admitted 11/05/18 low grade fever and shortness of breath. HS Trop negative. Covid 19 negative. Blood CX negative. Placed on antibiotics and discharged to home the next day.   Admitted 08/01/51 with A/C Diastolic HF. Diuresed with IV lasix and transitioned to torsemide 80/40 mg . Discharge weight 252 pounds.   Admitted 06/2019 with left arm pain. Left upper extremity venous Doppler revealed occluded cephalic vein at the AV fistula outflow tract in the antecubital fossa with no other thrombus identified. Vascular evaluated. He continued on Eliquis.  No plan for intervention. Also treated for acute gout flare. Discharged on prednisone taper.     Given IV lasix on 12/21 by HF Paramedicine.   Admitted to [facility] 2/22 with acute gout flare vs cellulitis. Nephrology was consulted to evaluate transplant regimen and follow along. He was treated with antibiotics and prednisone. Discharged on decreased torsemide dose of 40 mg daily. Weight 259 lbs.  Admitted 4/22 for left leg cellulitis after sustaining a dog scratch, received IV antibiotics. No changes to his diuretic therapy. Discharge weight 266  lbs.  Follow up with [name], 1/23, he had CP and underwent Lexiscan myoview, which overall was a low risk study with evidence of prior infarct in the infero-apical area with no significant change from before. Losartan added to GDMT regimen.  Follow up 5/23, volume overloaded, REDs 40%. Advised taking additional metolazone (3 doses that week), however labs showed worsening renal function and advised to keep at 2 doses.  Admitted in 5/23 with acute on chronic CHF and AKI.  [name] and losartan stopped. Diuresed with IV lasix. Echo showed EF 40-45%, RV mildly reduced, GIIIDD (restrictive), IVC dilated. Nephrology consulted for diuretic dosing. Discharged home, weight 266 lbs.  He had chest pain in 8/23 and was admitted at [facility], minimal HS-TnI elevation with no trend, thought to be demand ischemia from volume overload.    Echo 8/23 showing EF 40-45%, diffuse hypokinesis with dyssynchrony, mildly dilated RV with mildly decreased systolic function, IVC dilated, moderate AS with mean gradient 15, mild-moderate mitral stenosis with mean gradient 6.   Follow up 03/27/22, volume overloaded with 25 lbs weight gain. Instructed to use Furoscix x 3 days, then resume home dose of torsemide + metolazone. Clinic weight 277 lbs.  Admitted from AHF 04/01/22 with a/c CHF. Echo showed EF 40-45%, RV moderately reduced and RVSP 49 mmHg. RHC showed elevated filling pressures, moderate primarily pulmonary venous hypertension and preserved CO. Tadalafil stopped with primarily [place] group 2 and 3. He was diuresed with IV lasix and metolazone, and GDMT added back. Overall down 19 lbs. He was  discharged home, weight 255 lbs.  Today he returns for HF follow up. Overall feeling fine. He is not SOB walking on flat ground. LE swelling is  better. Occasional dizziness, 2 episodes recently. Did not check BP at the time, but episode felt similar low BP episodes in the past. Denies palpitations, CP, dizziness, abnormal bleeding or  PND/Orthopnea. Appetite ok. No fever or chills. Weight at home 255 pounds. Taking all medications. Home BPs 100s/70s.  ECG (personally reviewed): atrial fibrillation.  REDs: 42%  Labs (7/19): LDL 49 Labs (2/20): K 3.7, creatinine 1.43 Labs (4/20): K 4.3, creatinine 1.24 Labs (11/06/18): K 3.7 creatinine 1.23  Labs (05/07/19): K 3.8 Creatinine 1.5  Labs (07/18/19): K 4.3 Creatinine 1.42  Labs (5/21): K 3.3, creatinine 1.21 Labs (11/21): K 3.6, creatinine 1.4 Labs (12/21): K 3.3, creatinine 2.4, hgb 12.8 Labs (04/01/19): K 3.3 Creatinine 2.2  Labs (8/22): K 4.0, creatinine 1.6, HDL 35, LDL 36, a1c 7.5 Labs (2/23): LDL 31, HDL 36 Labs (4/23): K 3.3, creatinine 1.68 Labs (5/23): K 4.1, creatinine 2.24 Labs (8/23): K 3.5, creatinine 2.36 Labs (12/23): K 3.6, creatinine 1.99, hgb 9.3 Labs (1/24): K 4.6, creatinine 2.44  PMH: 1. CAD: s/p CABG x 4 in [place] in 2005.  - Cardiolite (1/20): EF 47%, small fixed apical septal defect, no ischemia.  Low risk study.  - Cardiolite (12/22): small apical inferior defect, no changes from prior, no ischemia. Low risk study. 2. Asthma 3. Gout 4. Atrial fibrillation: Chronic.  5. ESRD s/p renal transplantation at [facility] in 2012.  6. Anemia of chronic disease 7. Type II diabetes 8. HTN 9. Hyperlipidemia 10. OSA: On bipap.  11. Ischemic cardiomyopathy: EF 35-40% in past.  - Cardiolite (1/20) with EF 47%.  - Echo (1/20): EF 50-55%, mild MR, RV moderately dilated with moderately decreased systolic function, PASP 73 mmHg, moderate TR.   - Echo (3/20): EF 60-65%, PASP 75 mmHg, mildly dilated RV with mildly decreased systolic function.  - Echo (2/21): EF 45-50%, global HK, moderately dilated RV with moderately decreased systolic function, D-shaped interventricular septum, dilated IVC.  - RHC (2/21): mean RA 19, PA 78/31 mean 51, mean PCWP 26, CI 3.26, PVR 3.25 - Echo (5/23): EF 40-45%, RV mildly reduced, GIIIDD (restrictive) - Echo (8/23): EF 40-45%,  diffuse hypokinesis with dyssynchrony, mildly dilated RV with mildly decreased systolic function, IVC dilated, moderate AS with mean gradient 15, mild-moderate mitral stenosis with mean gradient 6.  - Echo (1/24): Echo showed EF 40-45%, RV moderately reduced and RVSP 49 mmHg.  - RHC (1/24): RA mean 13, PA 62/24 (mean 40), PCWP mean 20, CO/CI (Fick) 7.63/3.26, PVR 2.6 WU, PAPi 2.9 12. Obesity: s/p gastric bypass in 2015.  13. Bradycardia with beta blocker.  14. Aortic stenosis: Moderate on 8/23 echo.  15. Mitral stenosis: Mild to moderate on 8/23 echo.  16. Rheumatoid arthritis  Past Surgical History:  Procedure Laterality Date   CARDIAC CATHETERIZATION N/A 08/10/2002   CATARACT EXTRACTION W/PHACO Right 10/29/2017   Procedure: CATARACT EXTRACTION PHACO AND INTRAOCULAR LENS PLACEMENT ([place])  RIGHT DIABETIC;  Surgeon: [name], MD;  Location: [place];  Service: Ophthalmology;  Laterality: Right;  Diabetic - insulin   CATARACT EXTRACTION W/PHACO Left 11/18/2017   Procedure: CATARACT EXTRACTION PHACO AND INTRAOCULAR LENS PLACEMENT ([place]) LEFT IVA/TOPICAL;  Surgeon: [name], MD;  Location: [place];  Service: Ophthalmology;  Laterality: Left;  DIABETES - insulin sleep apnea   COLONOSCOPY WITH PROPOFOL N/A 04/22/2013   Procedure: COLONOSCOPY  WITH PROPOFOL;  Surgeon: [name], MD;  Location: [facility];  Service: Endoscopy;  Laterality: N/A;   CORONARY ANGIOPLASTY WITH STENT PLACEMENT N/A 1996   CORONARY ARTERY BYPASS GRAFT N/A 08/13/2002   Procedure: 4v CORONARY ARTERY BYPASS GRAFTING; Location: [name]; Surgeon: [name], MD   CYSTOSCOPY WITH INSERTION OF UROLIFT N/A 12/25/2021   Procedure: CYSTOSCOPY WITH INSERTION OF UROLIFT;  Surgeon: [name], MD;  Location: [facility];  Service: Urology;  Laterality: N/A;   DG ANGIO AV SHUNT*L*     right and left upper arms   FASCIOTOMY  03/03/2012   Procedure: FASCIOTOMY;  Surgeon: [name], MD;  Location: [place];  Service: Orthopedics;  Laterality: Right;  FASCIOTOMY RIGHT SMALL FINGER   FASCIOTOMY Left 08/17/2013   Procedure: FASCIOTOMY LEFT RING;  Surgeon: [name], MD;  Location: [place];  Service: Orthopedics;  Laterality: Left;   INCISION AND DRAINAGE ABSCESS Left 10/15/2015   Procedure: INCISION AND DRAINAGE ABSCESS;  Surgeon: [name], MD;  Location: [facility];  Service: General;  Laterality: Left;   KIDNEY TRANSPLANT  09/13/2010   cadaver--at [facility] HEART CATH N/A 11/15/2016   Procedure: RIGHT HEART CATH;  Surgeon: [name], MD;  Location: [place] [facility];  Service: Cardiovascular;  Laterality: N/A;   RIGHT HEART CATH N/A 05/03/2019   Procedure: RIGHT HEART CATH;  Surgeon: [name], MD;  Location: [place] [facility];  Service: Cardiovascular;  Laterality: N/A;   RIGHT HEART CATH N/A 04/04/2022   Procedure: RIGHT HEART CATH;  Surgeon: [name], MD;  Location: [place] [facility];  Service: Cardiovascular;  Laterality: N/A;   ROUX-EN-Y GASTRIC BYPASS N/A 2015   TYMPANIC MEMBRANE REPAIR Left 03/2010   VASECTOMY     Current Outpatient Medications  Medication Sig Dispense Refill   albuterol (PROVENTIL) (2.5 MG/3ML) 0.083% nebulizer solution Take 3 mLs (2.5 mg total) by nebulization every 6 (six) hours as needed for wheezing or shortness of breath. 150 mL 0   albuterol (VENTOLIN HFA) 108 (90 Base) MCG/ACT inhaler Inhale 2 puffs into the lungs every 6 (six) hours as needed for wheezing or shortness of breath. 18 g 5   allopurinol (ZYLOPRIM) 100 MG tablet Take 100 mg by mouth daily.     apixaban (ELIQUIS) 5 MG TABS tablet TAKE 1 TABLET BY MOUTH TWICE DAILY 180 tablet 1   beclomethasone (QVAR) 80 MCG/ACT inhaler Inhale 2 puffs into the lungs daily as needed (for shortness of breath).     buPROPion (WELLBUTRIN SR) 150 MG 12 hr tablet TAKE 1 TABLET BY MOUTH TWICE DAILY 180 tablet 3    cholecalciferol (VITAMIN D3) 25 MCG (1000 UNIT) tablet Take 1,000 Units by mouth at bedtime.     colchicine 0.6 MG tablet Take 1 tablet (0.6 mg total) by mouth every other day 30 tablet 1   Continuous Blood Gluc Sensor (FREESTYLE LIBRE 3 SENSOR) MISC Use one every 2 weeks. 6 each 3   dapagliflozin propanediol (FARXIGA) 10 MG TABS tablet Take 1 tablet (10 mg total) by mouth daily before breakfast. 90 tablet 3   fluticasone (FLONASE) 50 MCG/ACT nasal spray PLACE 2 SPRAYS INTO BOTH NOSTRILS DAILY. 16 g 11   gabapentin (NEURONTIN) 300 MG capsule TAKE 1 CAPSULE BY MOUTH TWICE DAILY 180 capsule 3   hydrALAZINE (APRESOLINE) 25 MG tablet Take 1 tablet (25 mg total) by mouth every 8 (eight) hours. 90 tablet 2  hydrocortisone 2.5 % cream Apply topically 3 (three) times daily as needed. 30 g 3   insulin glargine-yfgn (SEMGLEE) 100 UNIT/ML Pen Inject 35 Units subcutaneously once daily (Patient taking differently: Inject 35 Units into the skin at bedtime.) 45 mL 3   insulin lispro (HUMALOG KWIKPEN) 100 UNIT/ML KwikPen INJECT UP TO 50 UNITS UNDER THE SKIN DAILY AS DIRECTED (Patient taking differently: Inject 10-20 Units into the skin 3 (three) times daily. Per sliding scale) 45 mL 3   Insulin Pen Needle (UNIFINE PENTIPS) 31G X 5 MM MISC Use 4 times daily as directed 400 each 3   iron polysaccharides (FERREX 150) 150 MG capsule TAKE 1 CAPSULE BY MOUTH 2 TIMES DAILY. (Patient taking differently: Take 150 mg by mouth daily.) 180 capsule 3   isosorbide mononitrate (IMDUR) 30 MG 24 hr tablet Take 1 tablet (30 mg total) by mouth daily. 30 tablet 2   lidocaine (LIDODERM) 5 % Place 1 patch onto the skin daily as needed (for pain).     metolazone (ZAROXOLYN) 2.5 MG tablet Take 1 tablet by mouth 3 times weekly (Monday, Wednesday, Friday). 14 tablet 3   montelukast (SINGULAIR) 10 MG tablet TAKE 1 TABLET BY MOUTH AT BEDTIME. (Patient taking differently: Take 10 mg by mouth at bedtime.) 90 tablet 3   Multiple Vitamin  (MULTIVITAMIN WITH MINERALS) TABS tablet Take 1 tablet by mouth daily.     mycophenolate (MYFORTIC) 180 MG EC tablet Take 2 tablets (360 mg total) by mouth 2 (two) times daily. 360 tablet 3   omeprazole (PRILOSEC) 20 MG capsule TAKE 1 CAPSULE BY MOUTH DAILY. (Patient taking differently: Take 20 mg by mouth daily.) 90 capsule 3   Potassium Chloride CR (MICRO-K) 8 MEQ CPCR capsule CR Take 14 capsules (112 mEq total) by mouth 2 (two) times daily. 780 capsule 6   predniSONE (DELTASONE) 5 MG tablet Take 1 tablet (5 mg total) by mouth daily. 90 tablet 3   rosuvastatin (CRESTOR) 10 MG tablet Take 1 tablet (10 mg total) by mouth daily. 90 tablet 3   sulfamethoxazole-trimethoprim (BACTRIM) 400-80 MG tablet TAKE 1 TABLET BY MOUTH EVERY MONDAY, WEDNESDAY, FRIDAY. (Patient taking differently: Take 1 tablet by mouth See admin instructions. Take one tablet by mouth on Monday, Wednesday and Fridays) 36 tablet 3   tacrolimus (PROGRAF) 1 MG capsule Take 2 capsules (2 mg total) by mouth 2 times daily. 120 capsule 11   tamsulosin (FLOMAX) 0.4 MG CAPS capsule TAKE 1 CAPSULE BY MOUTH DAILY. 90 capsule 3   Testosterone 10 MG/ACT (2%) GEL 1 pump applied to inner thigh daily- 2 pumps total (Patient taking differently: Inject 1 Application into the skin daily.) 60 g 0   torsemide (DEMADEX) 100 MG tablet Take 1 tablet (100 mg total) by mouth 2 (two) times daily. 180 tablet 3   zinc sulfate 220 (50 Zn) MG capsule Take 220 mg by mouth daily.     febuxostat (ULORIC) 40 MG tablet Take 3 tablets (120 mg total) by mouth once daily (Patient not taking: Reported on 04/17/2022) 90 tablet 5   No current facility-administered medications for this encounter.   Allergies:   Contrast media [iodinated contrast media], Iodine, and Ibuprofen   Social History:  The patient  reports that he quit smoking about 27 years ago. His smoking use included cigars. He has never been exposed to tobacco smoke. He has never used smokeless tobacco. He  reports current alcohol use. He reports that he does not use drugs.   Family History:  The patient's family history includes Breast cancer in his maternal grandmother; Diabetes in his maternal grandmother; Heart disease in his father; Kidney disease in his father; Kidney failure in his father; Liver cancer in his paternal grandmother and paternal uncle; Valvular heart disease in his mother.   ROS:  Please see the history of present illness.   All other systems are personally reviewed and negative.   Recent Labs: 11/02/2021: Magnesium 2.1 04/01/2022: ALT 16; B Natriuretic Peptide 419.8; Platelets 116; TSH 2.828 04/04/2022: Hemoglobin 10.9; Hemoglobin 10.5 04/10/2022: BUN 85; Creatinine, Ser 2.06; Potassium 3.2; Sodium 140  Personally reviewed   Wt Readings from Last 3 Encounters:  04/17/22 119.7 kg (264 lb)  04/16/22 114.8 kg (253 lb)  04/06/22 115.7 kg (255 lb)    BP 132/80   Pulse 80   Wt 119.7 kg (264 lb)   SpO2 92%   BMI 37.88 kg/m  Physical Exam General:  NAD. No resp difficulty, walked into clinic HEENT: Normal Neck: Supple. No JVD. Carotids 2+ bilat; no bruits. No lymphadenopathy or thryomegaly appreciated. Cor: PMI nondisplaced. Irregular rate & rhythm. No rubs, gallops or murmurs. Lungs: Clear Abdomen: Obese, nontender, nondistended. No hepatosplenomegaly. No bruits or masses. Good bowel sounds. Extremities: No cyanosis, clubbing, rash, 1+ BLE pre-tibial edema Neuro: Alert & oriented x 3, cranial nerves grossly intact. Moves all 4 extremities w/o difficulty. Affect pleasant.  Assessment & Plan: 1. Chronic heart failure with Mid Range EF/ With prominent RV failure/pulmonary hypertension:  Ischemic cardiomyopathy.  ?If RV failure is related to severe OSA, he was a remote smoker. Echo in 2/21 with EF 45-50%, moderately dilated RV with moderately decreased systolic function, moderate pulmonary hypertension. Echo in 8/23 showed EF 40-45%, diffuse hypokinesis with dyssynchrony, mildly  dilated RV with mildly decreased systolic function, IVC dilated, moderate AS with mean gradient 15, mild-moderate mitral stenosis with mean gradient 6.  Admitted 1/24 w/ worsening NYHA IIIb-IV symptoms, with massive volume overload, failed Furoscix. Weight up 25-30 lbs. Echo this admit EF 40-45% , RV mod reduced, RVSP 49 mmHg. Diuresis complicated by cardiorenal syndrome, but diuresed 19 lbs. Today, improved NYHA II, volume improved but remains mildly volume overloaded. Weight up 5 lbs from discharged, REDs 42%. - Continue torsemide 100 mg bid + metolazone 2.5 mg three times/week, he can take extra 2.5 mg of metolazone today with extra 20 KCL. BMET and BNP today. - Stop 8 mEq KCL. Start 10 mEq tablets=> Continue 60 KCL bid. - With low home BPs and dizziness, decrease Imdur to 15 mg daily. - Continue Farxiga 10 mg daily. - Continue hydralazine 25 mg tid. - He is now off ARB and spironolactone with elevated creatinine.   - Tadalafil was stopped during recent admission (think [place] is group 2,3). 2. Atrial fibrillation: Chronic. Rate controlled.  - Continue Eliquis. No bleeding issues. - Not on beta blocker with history of bradycardia.   3. CAD: S/p CABG 2005.  Cardiolite 12/22 with prior MI, no ischemia.  No further chest pain. High threshold for cath given CKD.  - No ASA given Eliquis use.  - Continue Crestor 10 mg daily, good lipids 2/23. 4. CKD Stage 3b: S/p renal transplantation in 2012. Followed by transplant center at [facility]. He remains on mycophenolate and tacrolimus.    - BMET today. 5. OSA: Continue Bipap every night.  6. HTN: Home BPs on the soft side. - Decrease Imdur as above. I asked him to continue to check home BP and bring log next visit. 7. DM  2: On insulin. He follows with Endocrinology. - Continue dapagliflozin for now. 8. Pulmonary hypertension: Primarily pulmonary venous hypertension by RHC this admission though may be group 3 PH component from OSA.  - Now off tadalafil and  use Imdur as above.  9. Aortic stenosis: No more than mild on last echo.  10. Mitral stenosis: No more than mild on last echo.   11. Chronic Anemia: has been getting scheduled weekly IV Fe infusions, q Fridays   Follow up in 3 weeks with APP for fluid check and 8-10 weeks with [name]. Will send Furoscix to his house for PRN use.  Allena Katz, FNP-BC 04/17/22

## 2022-04-17 NOTE — Telephone Encounter (Signed)
Patient notified and voiced understanding.

## 2022-04-18 DIAGNOSIS — H35371 Puckering of macula, right eye: Secondary | ICD-10-CM | POA: Diagnosis not present

## 2022-04-18 DIAGNOSIS — H35372 Puckering of macula, left eye: Secondary | ICD-10-CM | POA: Diagnosis not present

## 2022-04-18 DIAGNOSIS — E113593 Type 2 diabetes mellitus with proliferative diabetic retinopathy without macular edema, bilateral: Secondary | ICD-10-CM | POA: Diagnosis not present

## 2022-04-18 LAB — HM DIABETES EYE EXAM

## 2022-04-22 ENCOUNTER — Encounter (HOSPITAL_COMMUNITY): Payer: Self-pay

## 2022-04-22 ENCOUNTER — Other Ambulatory Visit (HOSPITAL_COMMUNITY): Payer: Self-pay

## 2022-04-22 NOTE — Progress Notes (Signed)
Today had a visit with [name].  He states doing well.  Went over his prescriptions and he is taking what is prescribed.  He states did have an episode in [place] and felt like he was going to pass out.  He stood for about 10 minutes and it passed.  Advised him [facility] has blood pressure machines, if he could get his blood pressure reading to see if low or check his blood sugar to make sure it is ok.  He did say he had a low Bp reading at home once last week and felt the same.  He does try to watch high sodium foods and weighs daily.  He states his weight is still above his dry weight, he thinks he is good around 247 lbs.  He is watching his fluids, advised 60 ounces a day.  He is always on the go, advised to rest with feet up some during the day.  He has all his medications and aware of how to take them.  He was aware of his blood work and Public affairs consultant often.  Mood is good.  Lives with wife and she is his caregiver.  He loves to spend time with his grandson and family.  He loves cooking, gave him the number for [employer] for volunteering.  He is aware of up coming appts.  He is aware to call for help with weight gain or loss of 2 lbs overnight or 5 lbs in a week.  Will continue to visit for heart failure, diet and medication management.   [place] [phone]

## 2022-04-23 ENCOUNTER — Ambulatory Visit
Admission: RE | Admit: 2022-04-23 | Discharge: 2022-04-23 | Disposition: A | Discharge status: Home / Self Care | Payer: Commercial Managed Care - PPO | Source: Ambulatory Visit | Attending: Urology | Admitting: Urology

## 2022-04-23 DIAGNOSIS — N5089 Other specified disorders of the male genital organs: Secondary | ICD-10-CM | POA: Insufficient documentation

## 2022-04-23 DIAGNOSIS — N433 Hydrocele, unspecified: Secondary | ICD-10-CM | POA: Diagnosis not present

## 2022-04-24 MED FILL — Omeprazole Cap Delayed Release 20 MG: ORAL | 90 days supply | Qty: 90 | Fill #1 | Status: AC

## 2022-04-26 DIAGNOSIS — D631 Anemia in chronic kidney disease: Secondary | ICD-10-CM | POA: Diagnosis not present

## 2022-04-26 DIAGNOSIS — N184 Chronic kidney disease, stage 4 (severe): Secondary | ICD-10-CM | POA: Diagnosis not present

## 2022-04-29 ENCOUNTER — Other Ambulatory Visit: Payer: Self-pay

## 2022-04-29 ENCOUNTER — Other Ambulatory Visit: Payer: Self-pay | Admitting: Internal Medicine

## 2022-04-29 MED ORDER — LIDOCAINE 5 % EX PTCH
1.0000 | MEDICATED_PATCH | Freq: Every day | CUTANEOUS | 11 refills | Status: DC
  Filled 2022-04-29: qty 60, 60d supply, fill #0
  Filled 2022-07-25: qty 60, 60d supply, fill #1
  Filled 2022-09-19: qty 60, 60d supply, fill #2
  Filled 2022-10-31: qty 60, 60d supply, fill #3
  Filled 2023-03-12: qty 60, 60d supply, fill #4

## 2022-04-29 NOTE — Progress Notes (Unsigned)
04/30/2022 8:38 AM   [name]. 22-Sep-1960 [id number]  Referring provider: [name], MD [address] [place],  [place] [address]  Urological history: 1.Undesired fertility -vasectomy 2016  2. Testosterone deficiency -contributing factors of age, diabetes and obesity -testosterone level pending -Hemoglobin (03/2022) 10.9  3. BPH w/ LU TS - cysto (10/2021) Moderate lateral lobe enlargement with moderate median lobe  prostate - Elevated bladder neck -Moderate trabeculation - TRUS (10/2021) Transrectal Ultrasound performed revealing a 53 gm prostate measuring 4.55 x 4.06 x 5.44 cm (length) -UroLift (12/2021) 6 implants -I PSS 7/2 -PVR 36 mL  4. ED -contributing factors of age, BPH, CAD, DM, history of smoking, testosterone deficiency, depression, obesity, HLD, CKD, sleep apnea and HTN -tadalafil 20 mg, daily -discontinued pulmonary hypertension was felt to be WHO Group 2, 3 and was started on Imdur -SHIM 11  5. Prostate cancer screening -PSA pending -considering TRT  -baseline PSA 0.68 (2014) at age 33  6. Renal transplant -DDRT 08/2010 - followed at [facility] transplant clinic - last seen 10/2021 -non-contrast CT 10/2021 - Severe bilateral renal atrophy is noted consistent with end-stage renal disease. No hydronephrosis or renal obstruction is noted. Urinary bladder is unremarkable. Renal transplant is noted in the right lower quadrant without definite evidence of hydronephrosis.  7. Scrotal mass -scrotal US (10/2021) - 6.2 x 2.6 x 5.7 mm extratesticular tunical lesion at the anteromedial aspect of the superior pole of the right testicle, appears mostly if not completely solid and could be a complex tunical cyst or granuloma. Other etiologies are possible. Consider 6-12 month follow-up ultrasound to ensure stability. There is no positive color flow.  8. Varicocele -scrotal US (10/2021) On the left, there is a tangle of multiple ectatic veins in the  lateral hemiscrotum measuring up to 4.1 mm with Valsalva consistent with a varicocele.  9. Hydroceles -scrotal US (10/2021) Small scrotal hydroceles, both with floating debris.  10. Tubular ectasia -scrotal US (10/2021) Left-greater-than-right tubular ectasia of the rete testis.  Chief Complaint  Patient presents with   Hypogonadism    HPI: [name]. is a 62 y.o. male who presents today for erection induction for evaluation for possible Peyronie's disease as he is having difficulty achieving erections and he had significant plaque disease on exam.  I explained how we induce an erection and his questions were answered and he is ready to proceed  PMH: Past Medical History:  Diagnosis Date   Anemia    Aortic atherosclerosis ([place])    Asthma, persistent controlled 02/25/2013   Attention or concentration deficit 04/26/2021   BPH with obstruction/lower urinary tract symptoms 09/25/2014   CAD (coronary artery disease) 2005   a.) LHC 1996 -> HG stenosis mLAD -> PTCA/PCI with stent x 1 (unk type) -> complicated by CFA pseudoanurysm (required surg);  b.) LHC 08/10/2002  -> 95% LAD, 70% ISR LAD, 80% pOM, 70% mRCA --> CVTS consult; c.) 4v CABG 08/13/2002; c.) 03/2018 MV: Small, fixed inferoapical and apical sep defect. No isc. EF 47% (60-65% by 05/2018 TTE); d.) 02/2021 MV: small Apical inf fixed defect. No isc -> low risk.   Cellulitis and abscess of left leg 07/22/2020   Chronic atrial fibrillation    a.) CHA2DS2VASc = 4 (CHF, HTN, vascular disease history, T2DM);  b.) rate/rhythm maintained without pharmacological intervention; chronically anticoagulated with apixaban   Chronic combined systolic and diastolic CHF (congestive heart failure)    a.  7/18 Echo: EF 45-50%; b. 05/2018  Echo: EF 60-65%, RVSP 74.8; c. 12/2018 Echo: EF 50-55%. Sev dil LA; d. 04/2019 Echo: EF 45-50%; e. 07/2021 Echo: EF 40-45%, GrIII DD; f. 10/2021 Echo: EF 40-45%, mild conc LVH, septal-lat dyssynchrony (LBBB),  mildly reduced RV fxn, mild-mod dil LA, mildly dil RA, mild-mod MS, mod AS.   Chronic venous stasis dermatitis of both lower extremities    Cytomegaloviral disease 2017   Diverticulosis of colon 04/22/2013   ESRD (end stage renal disease) ([place])    a.) dialysis dependent 2004-2012; b.) s/p cadaveric RIGHT renal transplant 09/13/2010   Essential hypertension 12/17/2010   GERD (gastroesophageal reflux disease)    Gout    History of 2019 novel coronavirus disease (COVID-19) 06/30/2019   History of renal transplant 09/13/2010   a.) s/p cadaveric donor transplant 09/13/2010   Hx of bilateral cataract extraction 10/2017   Hyperlipidemia    Hypogonadism in male 09/25/2014   Ischemic cardiomyopathy    Left cephalic vein thrombosis 40/1027   Long term current use of anticoagulant    a.) apixaban   Long term current use of immunosuppressive drug    a.) mycophenolate + prednisone + tacrolimus   Major depressive disorder 10/04/2013   Mitral stenosis 11/07/2021   a. TTE 11/07/2021: severe MAC, mild-mod MS (MPG 6 mmHg)   Moderate aortic stenosis    a. 10/2021 Echo: Mod AS. AVA 1.06cm^2 (VTI). Mean grad 17mHg.   Obesity hypoventilation syndrome 03/31/2012   OSA treated with BiPAP 09/29/2012   PAH (pulmonary artery hypertension)    a. 04/2019 RHC: RA 19, RV 80/20, PA 78/31 (51), PCWP 25, CO/CI 7.69/3.26. PVR 3.25 WU-->Sev PAH, likely primarily PV HTN.   S/P CABG x 4 08/13/2002   a.) LIMA-LAD, LRA-OM, SVG-D1, SVG-dRCA   S/P gastric bypass    Synovitis of finger 06/11/2019   Trigger finger, unspecified little finger 12/23/2018   Type 2 diabetes mellitus with hyperglycemia, with long-term current use of insulin 09/09/2014   Ulcer 05/2016   Left shin   Wears glasses     Surgical History: Past Surgical History:  Procedure Laterality Date   CARDIAC CATHETERIZATION N/A 08/10/2002   CATARACT EXTRACTION W/PHACO Right 10/29/2017   Procedure: CATARACT EXTRACTION PHACO AND INTRAOCULAR LENS PLACEMENT  ([place]  RIGHT DIABETIC;  Surgeon: BLeandrew Koyanagi MD;  [place] [facility]  Service: Ophthalmology;  Laterality: Right;  Diabetic - insulin   CATARACT EXTRACTION W/PHACO Left 11/18/2017   Procedure: CATARACT EXTRACTION PHACO AND INTRAOCULAR LENS PLACEMENT ([place] LEFT IVA/TOPICAL;  Surgeon: BLeandrew Koyanagi MD;  [place] [facility]  Service: Ophthalmology;  Laterality: Left;  DIABETES - insulin sleep apnea   COLONOSCOPY WITH PROPOFOL N/A 04/22/2013   Procedure: COLONOSCOPY WITH PROPOFOL;  Surgeon: DMilus Banister MD;  Location: [facility];  Service: Endoscopy;  Laterality: N/A;   CORONARY ANGIOPLASTY WITH STENT PLACEMENT N/A 1996   CORONARY ARTERY BYPASS GRAFT N/A 08/13/2002   Procedure: 4v CORONARY ARTERY BYPASS GRAFTING; [facility] Surgeon: CAllegra Lai MD   CYSTOSCOPY WITH INSERTION OF UROLIFT N/A 12/25/2021   Procedure: CYSTOSCOPY WITH INSERTION OF UROLIFT;  Surgeon: SAbbie Sons MD;  Location: [facility];  Service: Urology;  Laterality: N/A;   DG ANGIO AV SHUNT*L*     right and left upper arms   FASCIOTOMY  03/03/2012   Procedure: FASCIOTOMY;  Surgeon: GWynonia Sours MD;  [place] [facility]  Service: Orthopedics;  Laterality: Right;  FASCIOTOMY RIGHT SMALL FINGER   FASCIOTOMY Left 08/17/2013   Procedure: FASCIOTOMY LEFT RING;  Surgeon: [name], MD;  Location: [place];  Service: Orthopedics;  Laterality: Left;   INCISION AND DRAINAGE ABSCESS Left 10/15/2015   Procedure: INCISION AND DRAINAGE ABSCESS;  Surgeon: [name], MD;  Location: [facility];  Service: General;  Laterality: Left;   KIDNEY TRANSPLANT  09/13/2010   cadaver--[facility] HEART CATH N/A 11/15/2016   Procedure: RIGHT HEART CATH;  Surgeon: [name], MD;  Location: [place] [facility];  Service: Cardiovascular;  Laterality: N/A;   RIGHT HEART CATH N/A 05/03/2019   Procedure: RIGHT HEART CATH;  Surgeon: [name],  MD;  Location: [place] [facility];  Service: Cardiovascular;  Laterality: N/A;   RIGHT HEART CATH N/A 04/04/2022   Procedure: RIGHT HEART CATH;  Surgeon: [name], MD;  Location: [place] [facility];  Service: Cardiovascular;  Laterality: N/A;   ROUX-EN-Y GASTRIC BYPASS N/A 2015   TYMPANIC MEMBRANE REPAIR Left 03/2010   VASECTOMY      Home Medications:  Allergies as of 04/30/2022       Reactions   Contrast Media [iodinated Contrast Media] Other (See Comments)   Kidney transplant   Iodine Other (See Comments)   Other reaction(s): Other (See Comments) Kidney transplant   Ibuprofen Other (See Comments)   Due to kidney transplant Due to kidney transplant Due to kidney transplant        Medication List        Accurate as of April 30, 2022  8:38 AM. If you have any questions, ask your nurse or doctor.          albuterol 108 (90 Base) MCG/ACT inhaler Commonly known as: VENTOLIN HFA Inhale 2 puffs into the lungs every 6 (six) hours as needed for wheezing or shortness of breath.   albuterol (2.5 MG/3ML) 0.083% nebulizer solution Commonly known as: PROVENTIL Inhale one vial (3 ml) via nebulizer every 6 hours as needed for wheezing or shortness of breath (Take 3 mLs (2.5 mg total) by nebulization every 6 (six) hours as needed for wheezing or shortness of breath.)   allopurinol 100 MG tablet Commonly known as: ZYLOPRIM Take 100 mg by mouth daily.   AMBULATORY NON FORMULARY MEDICATION Trimix (30/1/10)-(Pap/Phent/PGE)  Test Dose  53m vial   Qty [place]3 [phone] [phone] by: SZara Council PA-C   beclomethasone 80 MCG/ACT inhaler Commonly known as: QVAR Inhale 2 puffs into the lungs daily as needed (for shortness of breath).   buPROPion 150 MG 12 hr tablet Commonly known as: WELLBUTRIN SR TAKE 1 TABLET BY MOUTH TWICE DAILY   cholecalciferol 25 MCG (1000 UNIT) tablet Commonly known as: VITAMIN D3 Take 1,000  Units by mouth at bedtime.   colchicine 0.6 MG tablet Take 1 tablet (0.6 mg total) by mouth every other day   Eliquis 5 MG Tabs tablet Generic drug: apixaban TAKE 1 TABLET BY MOUTH TWICE DAILY   Farxiga 10 MG Tabs tablet Generic drug: dapagliflozin propanediol Take 1 tablet (10 mg total) by mouth daily before breakfast.   febuxostat 40 MG tablet Commonly known as: ULORIC Take 3 tablets (120 mg total) by mouth once daily   Ferrex 150 150 MG capsule Generic drug: iron polysaccharides TAKE 1 CAPSULE BY MOUTH 2 TIMES DAILY. What changed:  how much to take how to take this when to take this   fluticasone 50 MCG/ACT nasal spray Commonly known as: FLONASE PLACE 2 SPRAYS INTO BOTH NOSTRILS DAILY.  FreeStyle Libre 3 Sensor Misc Use one every 2 weeks.   gabapentin 300 MG capsule Commonly known as: NEURONTIN TAKE 1 CAPSULE BY MOUTH TWICE DAILY   HumaLOG KwikPen 100 UNIT/ML KwikPen Generic drug: insulin lispro INJECT UP TO 50 UNITS UNDER THE SKIN DAILY AS DIRECTED (INJECT UP TO 50 UNITS UNDER THE SKIN DAILY AS DIRECTED) What changed:  how much to take when to take this additional instructions   hydrALAZINE 25 MG tablet Commonly known as: APRESOLINE Take 1 tablet (25 mg total) by mouth every 8 (eight) hours.   hydrocortisone 2.5 % cream Apply topically 3 (three) times daily as needed.   isosorbide mononitrate 30 MG 24 hr tablet Commonly known as: IMDUR Take 0.5 tablets (15 mg total) by mouth daily.   lidocaine 5 % Commonly known as: LIDODERM Place 1 patch onto the skin daily as needed (for pain).   lidocaine 5 % Commonly known as: LIDODERM Place 1 patch onto the skin daily.PLACE 1 PATCH ONTO THE SKIN FOR 12 (TWELVE) HOURS. REMOVE & DISCARD PATCH WITHIN 12 HOURS OR AS DIRECTED BY MD   metolazone 2.5 MG tablet Commonly known as: ZAROXOLYN Take 1 tablet by mouth 3 times weekly (Monday, Wednesday, Friday).   montelukast 10 MG tablet Commonly known as:  SINGULAIR TAKE 1 TABLET BY MOUTH AT BEDTIME. What changed: how much to take   multivitamin with minerals Tabs tablet Take 1 tablet by mouth daily.   mycophenolate 180 MG EC tablet Commonly known as: MYFORTIC Take 2 tablets (360 mg total) by mouth 2 (two) times daily.   omeprazole 20 MG capsule Commonly known as: PRILOSEC TAKE 1 CAPSULE BY MOUTH DAILY. What changed: how much to take   potassium chloride 10 MEQ tablet Commonly known as: KLOR-CON Take 6 tablets (60 mEq total) by mouth 2 (two) times daily.   predniSONE 5 MG tablet Commonly known as: DELTASONE Take 1 tablet (5 mg total) by mouth daily.   rosuvastatin 10 MG tablet Commonly known as: CRESTOR Take 1 tablet (10 mg total) by mouth daily.   Semglee (yfgn) 100 UNIT/ML Pen Generic drug: insulin glargine-yfgn Inject 35 Units into the skin daily.   sulfamethoxazole-trimethoprim 400-80 MG tablet Commonly known as: BACTRIM TAKE 1 TABLET BY MOUTH EVERY MONDAY, WEDNESDAY, FRIDAY. What changed:  how much to take how to take this when to take this additional instructions   tacrolimus 1 MG capsule Commonly known as: PROGRAF Take 2 capsules (2 mg total) by mouth 2 times daily.   tamsulosin 0.4 MG Caps capsule Commonly known as: FLOMAX TAKE 1 CAPSULE BY MOUTH DAILY.   Testosterone 10 MG/ACT (2%) Gel 1 pump applied to inner thigh daily- 2 pumps total What changed:  how much to take how to take this when to take this additional instructions   torsemide 100 MG tablet Commonly known as: DEMADEX Take 1 tablet (100 mg total) by mouth 2 (two) times daily.   Unifine Pentips 31G X 5 MM Misc Generic drug: Insulin Pen Needle Use 4 times daily as directed   zinc sulfate 220 (50 Zn) MG capsule Take 220 mg by mouth daily.        Allergies:  Allergies  Allergen Reactions   Contrast Media [Iodinated Contrast Media] Other (See Comments)    Kidney transplant   Iodine Other (See Comments)    Other reaction(s):  Other (See Comments) Kidney transplant   Ibuprofen Other (See Comments)    Due to kidney transplant Due to kidney transplant Due to kidney  transplant    Family History: Family History  Problem Relation Age of Onset   Heart disease Father    Kidney failure Father    Kidney disease Father    Diabetes Maternal Grandmother    Breast cancer Maternal Grandmother    Valvular heart disease Mother    Liver cancer Paternal Uncle    Liver cancer Paternal Grandmother    Prostate cancer Neg Hx     Social History:  reports that he quit smoking about 27 years ago. His smoking use included cigars. He has never been exposed to tobacco smoke. He has never used smokeless tobacco. He reports current alcohol use. He reports that he does not use drugs.  ROS: Pertinent ROS in HPI  Physical Exam: BP (!) 165/87   Pulse 77   Ht '5\' 10"'$  (1.778 m)   Wt 250 lb (113.4 kg)   BMI 35.87 kg/m   Constitutional:  Well nourished. Alert and oriented, No acute distress. HEENT: [place] AT, moist mucus membranes.  Trachea midline Cardiovascular: No clubbing, cyanosis, or edema. Respiratory: Normal respiratory effort, no increased work of breathing. GU: No CVA tenderness.  No bladder fullness or masses.  Patient with circumcised phallus.  Urethral meatus is patent.  No penile discharge. No penile lesions or rashes.  Large Peyronie's plaques palpated along almost the entire dorsal side of the penis.  Scrotum without lesions, cysts, rashes and/or edema.   Neurologic: Grossly intact, no focal deficits, moving all 4 extremities. Psychiatric: Normal mood and affect.   Laboratory Data: N/A  Pertinent Imaging: N/A  Procedure  Patient's right corpus cavernosum is identified.  An area near the base of the penis is cleansed with rubbing alcohol.  Careful to avoid the dorsal vein, 0.5 of Caverject 20 mcg Lot # [id number] exp # [id number] is injected at a 90 degree angle into the right corpus cavernosum near the base of the penis.   Patient experienced a firm erection in 15 minutes.    Assessment & Plan:    1.  Peyronie's disease -Patient did not demonstrate any curve with induction of erection -Advised patient of the condition of priapism, painful erection lasting for more than four hours, and to contact the office or seek treatment in the ED immediately   2. ED -Patient having suboptimal results with PDE 5 inhibitors, so he would like to go ahead and pursue ICI -We will schedule ICI titration appointment and we have [facility] the [facility]  Return for ICI titration .  These notes generated with voice recognition software. I apologize for typographical errors.  [place], [place] [address]  [place], [place] [address] [phone]

## 2022-04-30 ENCOUNTER — Other Ambulatory Visit: Payer: Self-pay

## 2022-04-30 ENCOUNTER — Encounter: Payer: Self-pay | Admitting: Urology

## 2022-04-30 ENCOUNTER — Ambulatory Visit (INDEPENDENT_AMBULATORY_CARE_PROVIDER_SITE_OTHER): Payer: Commercial Managed Care - PPO | Admitting: Urology

## 2022-04-30 VITALS — BP 165/87 | HR 77 | Ht 70.0 in | Wt 250.0 lb

## 2022-04-30 DIAGNOSIS — N529 Male erectile dysfunction, unspecified: Secondary | ICD-10-CM | POA: Diagnosis not present

## 2022-04-30 DIAGNOSIS — N486 Induration penis plastica: Secondary | ICD-10-CM | POA: Diagnosis not present

## 2022-04-30 DIAGNOSIS — I2510 Atherosclerotic heart disease of native coronary artery without angina pectoris: Secondary | ICD-10-CM

## 2022-04-30 MED ORDER — AMBULATORY NON FORMULARY MEDICATION: 0 refills | Status: DC

## 2022-04-30 NOTE — Progress Notes (Incomplete)
Advanced Heart Failure Clinic Note   Date:  04/30/2022   ID:  [name]., DOB 22-Jul-1960, MRN [id number]   Provider location: [address] [place] Type of Visit:  Established patient  PCP:  [name], MD  Cardiologist:  [name] Nephrology: Dr. Ronnald Ramp  HF Cardiologist: [name]  HPI: [name]. is a 62 y.o. male  with a history of CAD s/p CABG 2005, chronic atrial fibrillation on Eliquis, ESRD s/p renal transplant 2012, anc chronic diastolic CHF.  His renal function has been fairly stable, most recent creatinine 1.43.  He has been struggling with volume overload/CHF.  He has a history of ischemic cardiomyopathy with EF as low as 35-40% in the past but echo in 3/20 showed EF 60-65%.  There is evidence for RV failure with pulmonary hypertension by echo.    Admitted 11/05/18 low grade fever and shortness of breath. HS Trop negative. Covid 19 negative. Blood CX negative. Placed on antibiotics and discharged to home the next day.   Admitted 06/29/94 with A/C Diastolic HF. Diuresed with IV lasix and transitioned to torsemide 80/40 mg . Discharge weight 252 pounds.   Admitted 06/2019 with left arm pain. Left upper extremity venous Doppler revealed occluded cephalic vein at the AV fistula outflow tract in the antecubital fossa with no other thrombus identified. Vascular evaluated. He continued on Eliquis.  No plan for intervention. Also treated for acute gout flare. Discharged on prednisone taper.     Given IV lasix on 12/21 by HF Paramedicine.   Admitted to [facility] 2/22 with acute gout flare vs cellulitis. Nephrology was consulted to evaluate transplant regimen and follow along. He was treated with antibiotics and prednisone. Discharged on decreased torsemide dose of 40 mg daily. Weight 259 lbs.  Admitted 4/22 for left leg cellulitis after sustaining a dog scratch, received IV antibiotics. No changes to his diuretic therapy. Discharge weight 266  lbs.  Follow up with [name], 1/23, he had CP and underwent Lexiscan myoview, which overall was a low risk study with evidence of prior infarct in the infero-apical area with no significant change from before. Losartan added to GDMT regimen.  Follow up 5/23, volume overloaded, REDs 40%. Advised taking additional metolazone (3 doses that week), however labs showed worsening renal function and advised to keep at 2 doses.  Admitted in 5/23 with acute on chronic CHF and AKI.  [name] and losartan stopped. Diuresed with IV lasix. Echo showed EF 40-45%, RV mildly reduced, GIIIDD (restrictive), IVC dilated. Nephrology consulted for diuretic dosing. Discharged home, weight 266 lbs.  He had chest pain in 8/23 and was admitted at [facility], minimal HS-TnI elevation with no trend, thought to be demand ischemia from volume overload.    Echo 8/23 showing EF 40-45%, diffuse hypokinesis with dyssynchrony, mildly dilated RV with mildly decreased systolic function, IVC dilated, moderate AS with mean gradient 15, mild-moderate mitral stenosis with mean gradient 6.   Follow up 03/27/22, volume overloaded with 25 lbs weight gain. Instructed to use Furoscix x 3 days, then resume home dose of torsemide + metolazone. Clinic weight 277 lbs.  Admitted from [facility] 04/01/22 with a/c CHF. Echo showed EF 40-45%, RV moderately reduced and RVSP 49 mmHg. RHC showed elevated filling pressures, moderate primarily pulmonary venous hypertension and preserved CO. Tadalafil stopped with primarily [place] group 2 and 3. He was diuresed with IV lasix and metolazone, and GDMT added back. Overall down 19 lbs. He was  discharged home, weight 255 lbs.  Today he returns for HF follow up. Overall feeling fine. He is not SOB walking on flat ground. LE swelling is  better. Occasional dizziness, 2 episodes recently. Did not check BP at the time, but episode felt similar low BP episodes in the past. Denies palpitations, CP, dizziness, abnormal bleeding or  PND/Orthopnea. Appetite ok. No fever or chills. Weight at home 255 pounds. Taking all medications. Home BPs 100s/70s.  ECG (personally reviewed): atrial fibrillation.  REDs: 42%  Labs (7/19): LDL 49 Labs (2/20): K 3.7, creatinine 1.43 Labs (4/20): K 4.3, creatinine 1.24 Labs (11/06/18): K 3.7 creatinine 1.23  Labs (05/07/19): K 3.8 Creatinine 1.5  Labs (07/18/19): K 4.3 Creatinine 1.42  Labs (5/21): K 3.3, creatinine 1.21 Labs (11/21): K 3.6, creatinine 1.4 Labs (12/21): K 3.3, creatinine 2.4, hgb 12.8 Labs (04/01/19): K 3.3 Creatinine 2.2  Labs (8/22): K 4.0, creatinine 1.6, HDL 35, LDL 36, a1c 7.5 Labs (2/23): LDL 31, HDL 36 Labs (4/23): K 3.3, creatinine 1.68 Labs (5/23): K 4.1, creatinine 2.24 Labs (8/23): K 3.5, creatinine 2.36 Labs (12/23): K 3.6, creatinine 1.99, hgb 9.3 Labs (1/24): K 4.6, creatinine 2.44  PMH: 1. CAD: s/p CABG x 4 in [place] in 2005.  - Cardiolite (1/20): EF 47%, small fixed apical septal defect, no ischemia.  Low risk study.  - Cardiolite (12/22): small apical inferior defect, no changes from prior, no ischemia. Low risk study. 2. Asthma 3. Gout 4. Atrial fibrillation: Chronic.  5. ESRD s/p renal transplantation at [facility] in 2012.  6. Anemia of chronic disease 7. Type II diabetes 8. HTN 9. Hyperlipidemia 10. OSA: On bipap.  11. Ischemic cardiomyopathy: EF 35-40% in past.  - Cardiolite (1/20) with EF 47%.  - Echo (1/20): EF 50-55%, mild MR, RV moderately dilated with moderately decreased systolic function, PASP 73 mmHg, moderate TR.   - Echo (3/20): EF 60-65%, PASP 75 mmHg, mildly dilated RV with mildly decreased systolic function.  - Echo (2/21): EF 45-50%, global HK, moderately dilated RV with moderately decreased systolic function, D-shaped interventricular septum, dilated IVC.  - RHC (2/21): mean RA 19, PA 78/31 mean 51, mean PCWP 26, CI 3.26, PVR 3.25 - Echo (5/23): EF 40-45%, RV mildly reduced, GIIIDD (restrictive) - Echo (8/23): EF 40-45%,  diffuse hypokinesis with dyssynchrony, mildly dilated RV with mildly decreased systolic function, IVC dilated, moderate AS with mean gradient 15, mild-moderate mitral stenosis with mean gradient 6.  - Echo (1/24): Echo showed EF 40-45%, RV moderately reduced and RVSP 49 mmHg.  - RHC (1/24): RA mean 13, PA 62/24 (mean 40), PCWP mean 20, CO/CI ([place]) 7.63/3.26, PVR 2.6 WU, PAPi 2.9 12. Obesity: s/p gastric bypass in 2015.  13. Bradycardia with beta blocker.  14. Aortic stenosis: Moderate on 8/23 echo.  15. Mitral stenosis: Mild to moderate on 8/23 echo.  16. Rheumatoid arthritis  Past Surgical History:  Procedure Laterality Date   CARDIAC CATHETERIZATION N/A 08/10/2002   CATARACT EXTRACTION W/PHACO Right 10/29/2017   Procedure: CATARACT EXTRACTION PHACO AND INTRAOCULAR LENS PLACEMENT ([place])  RIGHT DIABETIC;  Surgeon: [name], MD;  Location: [place];  Service: Ophthalmology;  Laterality: Right;  Diabetic - insulin   CATARACT EXTRACTION W/PHACO Left 11/18/2017   Procedure: CATARACT EXTRACTION PHACO AND INTRAOCULAR LENS PLACEMENT ([place]) LEFT IVA/TOPICAL;  Surgeon: [name], MD;  Location: [place];  Service: Ophthalmology;  Laterality: Left;  DIABETES - insulin sleep apnea   COLONOSCOPY WITH PROPOFOL N/A 04/22/2013   Procedure: COLONOSCOPY  WITH PROPOFOL;  Surgeon: [name], MD;  Location: [facility];  Service: Endoscopy;  Laterality: N/A;   CORONARY ANGIOPLASTY WITH STENT PLACEMENT N/A 1996   CORONARY ARTERY BYPASS GRAFT N/A 08/13/2002   Procedure: 4v CORONARY ARTERY BYPASS GRAFTING; Location: [name]; Surgeon: [name], MD   CYSTOSCOPY WITH INSERTION OF UROLIFT N/A 12/25/2021   Procedure: CYSTOSCOPY WITH INSERTION OF UROLIFT;  Surgeon: [name], MD;  Location: [facility];  Service: Urology;  Laterality: N/A;   DG ANGIO AV SHUNT*L*     right and left upper arms   FASCIOTOMY  03/03/2012   Procedure: FASCIOTOMY;  Surgeon: [name], MD;  Location: [place];  Service: Orthopedics;  Laterality: Right;  FASCIOTOMY RIGHT SMALL FINGER   FASCIOTOMY Left 08/17/2013   Procedure: FASCIOTOMY LEFT RING;  Surgeon: [name], MD;  Location: [place];  Service: Orthopedics;  Laterality: Left;   INCISION AND DRAINAGE ABSCESS Left 10/15/2015   Procedure: INCISION AND DRAINAGE ABSCESS;  Surgeon: [name], MD;  Location: [facility];  Service: General;  Laterality: Left;   KIDNEY TRANSPLANT  09/13/2010   cadaver[facility] HEART CATH N/A 11/15/2016   Procedure: RIGHT HEART CATH;  Surgeon: [name], MD;  Location: [place] [facility];  Service: Cardiovascular;  Laterality: N/A;   RIGHT HEART CATH N/A 05/03/2019   Procedure: RIGHT HEART CATH;  Surgeon: [name], MD;  Location: [place] [facility];  Service: Cardiovascular;  Laterality: N/A;   RIGHT HEART CATH N/A 04/04/2022   Procedure: RIGHT HEART CATH;  Surgeon: [name], MD;  Location: [place] [facility];  Service: Cardiovascular;  Laterality: N/A;   ROUX-EN-Y GASTRIC BYPASS N/A 2015   TYMPANIC MEMBRANE REPAIR Left 03/2010   VASECTOMY     Current Outpatient Medications  Medication Sig Dispense Refill   albuterol (PROVENTIL) (2.5 MG/3ML) 0.083% nebulizer solution Take 3 mLs (2.5 mg total) by nebulization every 6 (six) hours as needed for wheezing or shortness of breath. 150 mL 0   albuterol (VENTOLIN HFA) 108 (90 Base) MCG/ACT inhaler Inhale 2 puffs into the lungs every 6 (six) hours as needed for wheezing or shortness of breath. 18 g 5   allopurinol (ZYLOPRIM) 100 MG tablet Take 100 mg by mouth daily.     apixaban (ELIQUIS) 5 MG TABS tablet TAKE 1 TABLET BY MOUTH TWICE DAILY 180 tablet 1   beclomethasone (QVAR) 80 MCG/ACT inhaler Inhale 2 puffs into the lungs daily as needed (for shortness of breath).     buPROPion (WELLBUTRIN SR) 150 MG 12 hr tablet TAKE 1 TABLET BY MOUTH TWICE DAILY 180 tablet 3    cholecalciferol (VITAMIN D3) 25 MCG (1000 UNIT) tablet Take 1,000 Units by mouth at bedtime.     colchicine 0.6 MG tablet Take 1 tablet (0.6 mg total) by mouth every other day 30 tablet 1   Continuous Blood Gluc Sensor (FREESTYLE LIBRE 3 SENSOR) MISC Use one every 2 weeks. 6 each 3   dapagliflozin propanediol (FARXIGA) 10 MG TABS tablet Take 1 tablet (10 mg total) by mouth daily before breakfast. 90 tablet 3   febuxostat (ULORIC) 40 MG tablet Take 3 tablets (120 mg total) by mouth once daily 90 tablet 5   fluticasone (FLONASE) 50 MCG/ACT nasal spray PLACE 2 SPRAYS INTO BOTH NOSTRILS DAILY. 16 g 11   gabapentin (NEURONTIN) 300 MG capsule TAKE 1 CAPSULE BY MOUTH TWICE DAILY 180 capsule 3  hydrALAZINE (APRESOLINE) 25 MG tablet Take 1 tablet (25 mg total) by mouth every 8 (eight) hours. 90 tablet 2   hydrocortisone 2.5 % cream Apply topically 3 (three) times daily as needed. 30 g 3   insulin glargine-yfgn (SEMGLEE) 100 UNIT/ML Pen Inject 35 Units into the skin daily. 45 mL 3   insulin lispro (HUMALOG KWIKPEN) 100 UNIT/ML KwikPen INJECT UP TO 50 UNITS UNDER THE SKIN DAILY AS DIRECTED (Patient taking differently: Inject 10-20 Units into the skin 3 (three) times daily. Per sliding scale) 45 mL 3   Insulin Pen Needle (UNIFINE PENTIPS) 31G X 5 MM MISC Use 4 times daily as directed 400 each 3   iron polysaccharides (FERREX 150) 150 MG capsule TAKE 1 CAPSULE BY MOUTH 2 TIMES DAILY. (Patient taking differently: Take 150 mg by mouth daily.) 180 capsule 3   isosorbide mononitrate (IMDUR) 30 MG 24 hr tablet Take 0.5 tablets (15 mg total) by mouth daily. 15 tablet 8   lidocaine (LIDODERM) 5 % Place 1 patch onto the skin daily as needed (for pain).     lidocaine (LIDODERM) 5 % Place 1 patch onto the skin daily.PLACE 1 PATCH ONTO THE SKIN FOR 12 (TWELVE) HOURS. REMOVE & DISCARD PATCH WITHIN 12 HOURS OR AS DIRECTED BY MD 60 patch 11   metolazone (ZAROXOLYN) 2.5 MG tablet Take 1 tablet by mouth 3 times weekly (Monday,  Wednesday, Friday). 14 tablet 3   montelukast (SINGULAIR) 10 MG tablet TAKE 1 TABLET BY MOUTH AT BEDTIME. (Patient taking differently: Take 10 mg by mouth at bedtime.) 90 tablet 3   Multiple Vitamin (MULTIVITAMIN WITH MINERALS) TABS tablet Take 1 tablet by mouth daily.     mycophenolate (MYFORTIC) 180 MG EC tablet Take 2 tablets (360 mg total) by mouth 2 (two) times daily. 360 tablet 3   omeprazole (PRILOSEC) 20 MG capsule TAKE 1 CAPSULE BY MOUTH DAILY. (Patient taking differently: Take 20 mg by mouth daily.) 90 capsule 3   potassium chloride (KLOR-CON) 10 MEQ tablet Take 6 tablets (60 mEq total) by mouth 2 (two) times daily. 360 tablet 8   predniSONE (DELTASONE) 5 MG tablet Take 1 tablet (5 mg total) by mouth daily. 90 tablet 3   rosuvastatin (CRESTOR) 10 MG tablet Take 1 tablet (10 mg total) by mouth daily. 90 tablet 3   sulfamethoxazole-trimethoprim (BACTRIM) 400-80 MG tablet TAKE 1 TABLET BY MOUTH EVERY MONDAY, WEDNESDAY, FRIDAY. (Patient taking differently: Take 1 tablet by mouth See admin instructions. Take one tablet by mouth on Monday, Wednesday and Fridays) 36 tablet 3   tacrolimus (PROGRAF) 1 MG capsule Take 2 capsules (2 mg total) by mouth 2 times daily. 120 capsule 11   tamsulosin (FLOMAX) 0.4 MG CAPS capsule TAKE 1 CAPSULE BY MOUTH DAILY. 90 capsule 3   Testosterone 10 MG/ACT (2%) GEL 1 pump applied to inner thigh daily- 2 pumps total (Patient taking differently: Inject 1 Application into the skin daily.) 60 g 0   torsemide (DEMADEX) 100 MG tablet Take 1 tablet (100 mg total) by mouth 2 (two) times daily. 180 tablet 3   zinc sulfate 220 (50 Zn) MG capsule Take 220 mg by mouth daily.     No current facility-administered medications for this visit.   Allergies:   Contrast media [iodinated contrast media], Iodine, and Ibuprofen   Social History:  The patient  reports that he quit smoking about 27 years ago. His smoking use included cigars. He has never been exposed to tobacco smoke. He  has never  used smokeless tobacco. He reports current alcohol use. He reports that he does not use drugs.   Family History:  The patient's family history includes Breast cancer in his maternal grandmother; Diabetes in his maternal grandmother; Heart disease in his father; Kidney disease in his father; Kidney failure in his father; Liver cancer in his paternal grandmother and paternal uncle; Valvular heart disease in his mother.   ROS:  Please see the history of present illness.   All other systems are personally reviewed and negative.   Recent Labs: 11/02/2021: Magnesium 2.1 04/01/2022: ALT 16; Platelets 116; TSH 2.828 04/04/2022: Hemoglobin 10.9; Hemoglobin 10.5 04/17/2022: B Natriuretic Peptide 191.5; BUN 108; Creatinine, Ser 2.44; Potassium 3.8; Sodium 138  Personally reviewed   Wt Readings from Last 3 Encounters:  04/30/22 113.4 kg (250 lb)  04/22/22 114.8 kg (253 lb)  04/17/22 119.7 kg (264 lb)    There were no vitals taken for this visit. Physical Exam General:  NAD. No resp difficulty, walked into clinic HEENT: Normal Neck: Supple. No JVD. Carotids 2+ bilat; no bruits. No lymphadenopathy or thryomegaly appreciated. Cor: PMI nondisplaced. Irregular rate & rhythm. No rubs, gallops or murmurs. Lungs: Clear Abdomen: Obese, nontender, nondistended. No hepatosplenomegaly. No bruits or masses. Good bowel sounds. Extremities: No cyanosis, clubbing, rash, 1+ BLE pre-tibial edema Neuro: Alert & oriented x 3, cranial nerves grossly intact. Moves all 4 extremities w/o difficulty. Affect pleasant.  Assessment & Plan: 1. Chronic heart failure with [place] EF/ With prominent RV failure/pulmonary hypertension:  Ischemic cardiomyopathy.  ?If RV failure is related to severe OSA, he was a remote smoker. Echo in 2/21 with EF 45-50%, moderately dilated RV with moderately decreased systolic function, moderate pulmonary hypertension. Echo in 8/23 showed EF 40-45%, diffuse hypokinesis with dyssynchrony,  mildly dilated RV with mildly decreased systolic function, IVC dilated, moderate AS with mean gradient 15, mild-moderate mitral stenosis with mean gradient 6.  Admitted 1/24 w/ worsening NYHA IIIb-IV symptoms, with massive volume overload, failed Furoscix. Weight up 25-30 lbs. Echo this admit EF 40-45% , RV mod reduced, RVSP 49 mmHg. Diuresis complicated by cardiorenal syndrome, but diuresed 19 lbs. Today, improved NYHA II, volume improved but remains mildly volume overloaded. Weight up 5 lbs from discharged, REDs 42%. - Continue torsemide 100 mg bid + metolazone 2.5 mg three times/week, he can take extra 2.5 mg of metolazone today with extra 20 KCL. BMET and BNP today. - Stop 8 mEq KCL. Start 10 mEq tablets=> Continue 60 KCL bid. - With low home BPs and dizziness, decrease Imdur to 15 mg daily. - Continue Farxiga 10 mg daily. - Continue hydralazine 25 mg tid. - He is now off ARB and spironolactone with elevated creatinine.   - Tadalafil was stopped during recent admission (think [place] is group 2,3). 2. Atrial fibrillation: Chronic. Rate controlled.  - Continue Eliquis. No bleeding issues. - Not on beta blocker with history of bradycardia.   3. CAD: S/p CABG 2005.  Cardiolite 12/22 with prior MI, no ischemia.  No further chest pain. High threshold for cath given CKD.  - No ASA given Eliquis use.  - Continue Crestor 10 mg daily, good lipids 2/23. 4. CKD Stage 3b: S/p renal transplantation in 2012. Follow[facility]. He remains on mycophenolate and tacrolimus.    - BMET today. 5. OSA: Continue Bipap every night.  6. HTN: Home BPs on the soft side. - Decrease Imdur as above. I asked him to continue to check home BP and  bring log next visit. 7. DM 2: On insulin. He follows with Endocrinology. - Continue dapagliflozin for now. 8. Pulmonary hypertension: Primarily pulmonary venous hypertension by RHC this admission though may be group 3 PH component from OSA.  - Now off  tadalafil and use Imdur as above.  9. Aortic stenosis: No more than mild on last echo.  10. Mitral stenosis: No more than mild on last echo.   11. Chronic Anemia: has been getting scheduled weekly IV Fe infusions, q Fridays   Follow up in 3 weeks with APP for fluid check and 8-10 weeks with [name]. Will send Furoscix to his house for PRN use.  Allena Katz, FNP-BC 04/30/22

## 2022-05-01 ENCOUNTER — Encounter: Payer: Self-pay | Admitting: Internal Medicine

## 2022-05-01 ENCOUNTER — Ambulatory Visit (INDEPENDENT_AMBULATORY_CARE_PROVIDER_SITE_OTHER): Payer: Commercial Managed Care - PPO | Admitting: Internal Medicine

## 2022-05-01 ENCOUNTER — Encounter (HOSPITAL_COMMUNITY): Payer: Commercial Managed Care - PPO

## 2022-05-01 VITALS — BP 136/86 | HR 76 | Temp 97.5°F | Ht 70.0 in | Wt 264.0 lb

## 2022-05-01 DIAGNOSIS — A630 Anogenital (venereal) warts: Secondary | ICD-10-CM | POA: Insufficient documentation

## 2022-05-01 DIAGNOSIS — I2510 Atherosclerotic heart disease of native coronary artery without angina pectoris: Secondary | ICD-10-CM

## 2022-05-01 NOTE — Assessment & Plan Note (Signed)
This has rapidly progressed --likely due to his immunosuppressive  Going to dermatologist this week--should review treatment options with her I am concerned about self applied therapy--since he can't see where he would be putting it

## 2022-05-01 NOTE — Progress Notes (Signed)
Subjective:    Patient ID: Carl Georgie Haque., male    DOB: [birth year], 62 y.o.   MRN: [id number]  HPI Here due to ongoing concerns and trouble with genital warts By the rectum Having more symptoms with this Hasn't had other evaluation  First noticed them in last fall My exam showed some warts but no clear hemorrhoids Has ongoing pain, bleeding, itching Thinks the warts are "multiplying"  Current Outpatient Medications on File Prior to Visit  Medication Sig Dispense Refill   albuterol (PROVENTIL) (2.5 MG/3ML) 0.083% nebulizer solution Take 3 mLs (2.5 mg total) by nebulization every 6 (six) hours as needed for wheezing or shortness of breath. 150 mL 0   albuterol (VENTOLIN HFA) 108 (90 Base) MCG/ACT inhaler Inhale 2 puffs into the lungs every 6 (six) hours as needed for wheezing or shortness of breath. 18 g 5   allopurinol (ZYLOPRIM) 100 MG tablet Take 100 mg by mouth daily.     AMBULATORY NON FORMULARY MEDICATION Trimix (30/1/10)-(Pap/Phent/PGE)  Test Dose  39m vial   Qty #3 Refills 0  [facility] [phone] [phone] mL 0   apixaban (ELIQUIS) 5 MG TABS tablet TAKE 1 TABLET BY MOUTH TWICE DAILY 180 tablet 1   beclomethasone (QVAR) 80 MCG/ACT inhaler Inhale 2 puffs into the lungs daily as needed (for shortness of breath).     buPROPion (WELLBUTRIN SR) 150 MG 12 hr tablet TAKE 1 TABLET BY MOUTH TWICE DAILY 180 tablet 3   cholecalciferol (VITAMIN D3) 25 MCG (1000 UNIT) tablet Take 1,000 Units by mouth at bedtime.     colchicine 0.6 MG tablet Take 1 tablet (0.6 mg total) by mouth every other day 30 tablet 1   Continuous Blood Gluc Sensor (FREESTYLE LIBRE 3 SENSOR) MISC Use one every 2 weeks. 6 each 3   dapagliflozin propanediol (FARXIGA) 10 MG TABS tablet Take 1 tablet (10 mg total) by mouth daily before breakfast. 90 tablet 3   febuxostat (ULORIC) 40 MG tablet Take 3 tablets (120 mg total) by mouth once daily 90 tablet 5   fluticasone (FLONASE) 50 MCG/ACT  nasal spray PLACE 2 SPRAYS INTO BOTH NOSTRILS DAILY. 16 g 11   gabapentin (NEURONTIN) 300 MG capsule TAKE 1 CAPSULE BY MOUTH TWICE DAILY 180 capsule 3   hydrALAZINE (APRESOLINE) 25 MG tablet Take 1 tablet (25 mg total) by mouth every 8 (eight) hours. 90 tablet 2   hydrocortisone 2.5 % cream Apply topically 3 (three) times daily as needed. 30 g 3   insulin glargine-yfgn (SEMGLEE) 100 UNIT/ML Pen Inject 35 Units into the skin daily. 45 mL 3   insulin lispro (HUMALOG KWIKPEN) 100 UNIT/ML KwikPen INJECT UP TO 50 UNITS UNDER THE SKIN DAILY AS DIRECTED (Patient taking differently: Inject 10-20 Units into the skin 3 (three) times daily. Per sliding scale) 45 mL 3   Insulin Pen Needle (UNIFINE PENTIPS) 31G X 5 MM MISC Use 4 times daily as directed 400 each 3   iron polysaccharides (FERREX 150) 150 MG capsule TAKE 1 CAPSULE BY MOUTH 2 TIMES DAILY. (Patient taking differently: Take 150 mg by mouth daily.) 180 capsule 3   isosorbide mononitrate (IMDUR) 30 MG 24 hr tablet Take 0.5 tablets (15 mg total) by mouth daily. 15 tablet 8   lidocaine (LIDODERM) 5 % Place 1 patch onto the skin daily as needed (for pain).     lidocaine (LIDODERM) 5 % Place 1 patch onto the skin daily.PLACE 1 PATCH ONTO THE SKIN FOR 12 (TWELVE)  HOURS. REMOVE & DISCARD PATCH WITHIN 12 HOURS OR AS DIRECTED BY MD 60 patch 11   metolazone (ZAROXOLYN) 2.5 MG tablet Take 1 tablet by mouth 3 times weekly (Monday, Wednesday, Friday). 14 tablet 3   montelukast (SINGULAIR) 10 MG tablet TAKE 1 TABLET BY MOUTH AT BEDTIME. (Patient taking differently: Take 10 mg by mouth at bedtime.) 90 tablet 3   Multiple Vitamin (MULTIVITAMIN WITH MINERALS) TABS tablet Take 1 tablet by mouth daily.     mycophenolate (MYFORTIC) 180 MG EC tablet Take 2 tablets (360 mg total) by mouth 2 (two) times daily. 360 tablet 3   omeprazole (PRILOSEC) 20 MG capsule TAKE 1 CAPSULE BY MOUTH DAILY. (Patient taking differently: Take 20 mg by mouth daily.) 90 capsule 3   potassium  chloride (KLOR-CON) 10 MEQ tablet Take 6 tablets (60 mEq total) by mouth 2 (two) times daily. 360 tablet 8   predniSONE (DELTASONE) 5 MG tablet Take 1 tablet (5 mg total) by mouth daily. 90 tablet 3   rosuvastatin (CRESTOR) 10 MG tablet Take 1 tablet (10 mg total) by mouth daily. 90 tablet 3   sulfamethoxazole-trimethoprim (BACTRIM) 400-80 MG tablet TAKE 1 TABLET BY MOUTH EVERY MONDAY, WEDNESDAY, FRIDAY. (Patient taking differently: Take 1 tablet by mouth See admin instructions. Take one tablet by mouth on Monday, Wednesday and Fridays) 36 tablet 3   tacrolimus (PROGRAF) 1 MG capsule Take 2 capsules (2 mg total) by mouth 2 times daily. 120 capsule 11   tamsulosin (FLOMAX) 0.4 MG CAPS capsule TAKE 1 CAPSULE BY MOUTH DAILY. 90 capsule 3   Testosterone 10 MG/ACT (2%) GEL 1 pump applied to inner thigh daily- 2 pumps total (Patient taking differently: Inject 1 Application into the skin daily.) 60 g 0   torsemide (DEMADEX) 100 MG tablet Take 1 tablet (100 mg total) by mouth 2 (two) times daily. 180 tablet 3   zinc sulfate 220 (50 Zn) MG capsule Take 220 mg by mouth daily.     No current facility-administered medications on file prior to visit.    Allergies  Allergen Reactions   Contrast Media [Iodinated Contrast Media] Other (See Comments)    Kidney transplant   Iodine Other (See Comments)    Other reaction(s): Other (See Comments) Kidney transplant   Ibuprofen Other (See Comments)    Due to kidney transplant Due to kidney transplant Due to kidney transplant    Past Medical History:  Diagnosis Date   Anemia    Aortic atherosclerosis (HCC)    Asthma, persistent controlled 02/25/2013   Attention or concentration deficit 04/26/2021   BPH with obstruction/lower urinary tract symptoms 09/25/2014   CAD (coronary artery disease) 2005   a.) LHC 1996 -> HG stenosis mLAD -> PTCA/PCI with stent x 1 (unk type) -> complicated by CFA pseudoanurysm (required surg);  b.) LHC 08/10/2002  -> 95% LAD, 70%  ISR LAD, 80% pOM, 70% mRCA --> CVTS consult; c.) 4v CABG 08/13/2002; c.) 03/2018 MV: Small, fixed inferoapical and apical sep defect. No isc. EF 47% (60-65% by 05/2018 TTE); d.) 02/2021 MV: small Apical inf fixed defect. No isc -> low risk.   Cellulitis and abscess of left leg 07/22/2020   Chronic atrial fibrillation    a.) CHA2DS2VASc = 4 (CHF, HTN, vascular disease history, T2DM);  b.) rate/rhythm maintained without pharmacological intervention; chronically anticoagulated with apixaban   Chronic combined systolic and diastolic CHF (congestive heart failure)    a.  7/18 Echo: EF 45-50%; b. 05/2018 Echo: EF 60-65%, RVSP 74.8; c.  12/2018 Echo: EF 50-55%. Sev dil LA; d. 04/2019 Echo: EF 45-50%; e. 07/2021 Echo: EF 40-45%, GrIII DD; f. 10/2021 Echo: EF 40-45%, mild conc LVH, septal-lat dyssynchrony (LBBB), mildly reduced RV fxn, mild-mod dil LA, mildly dil RA, mild-mod MS, mod AS.   Chronic venous stasis dermatitis of both lower extremities    Cytomegaloviral disease 2017   Diverticulosis of colon 04/22/2013   ESRD (end stage renal disease) ([place])    a.) dialysis dependent 2004-2012; b.) s/p cadaveric RIGHT renal transplant 09/13/2010   Essential hypertension 12/17/2010   GERD (gastroesophageal reflux disease)    Gout    History of 2019 novel coronavirus disease (COVID-19) 06/30/2019   History of renal transplant 09/13/2010   a.) s/p cadaveric donor transplant 09/13/2010   Hx of bilateral cataract extraction 10/2017   Hyperlipidemia    Hypogonadism in male 09/25/2014   Ischemic cardiomyopathy    Left cephalic vein thrombosis 02/4579   Long term current use of anticoagulant    a.) apixaban   Long term current use of immunosuppressive drug    a.) mycophenolate + prednisone + tacrolimus   Major depressive disorder 10/04/2013   Mitral stenosis 11/07/2021   a. TTE 11/07/2021: severe MAC, mild-mod MS (MPG 6 mmHg)   Moderate aortic stenosis    a. 10/2021 Echo: Mod AS. AVA 1.06cm^2 (VTI). Mean grad  53mHg.   Obesity hypoventilation syndrome 03/31/2012   OSA treated with BiPAP 09/29/2012   PAH (pulmonary artery hypertension)    a. 04/2019 RHC: RA 19, RV 80/20, PA 78/31 (51), PCWP 25, CO/CI 7.69/3.26. PVR 3.25 WU-->Sev PAH, likely primarily PV HTN.   S/P CABG x 4 08/13/2002   a.) LIMA-LAD, LRA-OM, SVG-D1, SVG-dRCA   S/P gastric bypass    Synovitis of finger 06/11/2019   Trigger finger, unspecified little finger 12/23/2018   Type 2 diabetes mellitus with hyperglycemia, with long-term current use of insulin 09/09/2014   Ulcer 05/2016   Left shin   Wears glasses     Past Surgical History:  Procedure Laterality Date   CARDIAC CATHETERIZATION N/A 08/10/2002   CATARACT EXTRACTION W/PHACO Right 10/29/2017   Procedure: CATARACT EXTRACTION PHACO AND INTRAOCULAR LENS PLACEMENT ([place]  RIGHT DIABETIC;  Surgeon: BLeandrew Koyanagi MD;  [facility]  Service: Ophthalmology;  Laterality: Right;  Diabetic - insulin   CATARACT EXTRACTION W/PHACO Left 11/18/2017   Procedure: CATARACT EXTRACTION PHACO AND INTRAOCULAR LENS PLACEMENT ([place] LEFT IVA/TOPICAL;  Surgeon: BLeandrew Koyanagi MD;  [facility]  Service: Ophthalmology;  Laterality: Left;  DIABETES - insulin sleep apnea   COLONOSCOPY WITH PROPOFOL N/A 04/22/2013   Procedure: COLONOSCOPY WITH PROPOFOL;  Surgeon: DMilus Banister MD;  [facility];  Service: Endoscopy;  Laterality: N/A;   CORONARY ANGIOPLASTY WITH STENT PLACEMENT N/A 1996   CORONARY ARTERY BYPASS GRAFT N/A 08/13/2002   Procedure: 4v CORONARY ARTERY BYPASS GRAFTING; [facility] Surgeon: CAllegra Lai MD   CYSTOSCOPY WITH INSERTION OF UROLIFT N/A 12/25/2021   Procedure: CYSTOSCOPY WITH INSERTION OF UROLIFT;  Surgeon: SAbbie Sons MD;  Location: [facility];  Service: Urology;  Laterality: N/A;   DG ANGIO AV SHUNT*L*     right and left upper arms   FASCIOTOMY  03/03/2012   Procedure: FASCIOTOMY;  Surgeon: GWynonia Sours MD;  [facility]  Service: Orthopedics;  Laterality: Right;  FASCIOTOMY RIGHT SMALL FINGER   FASCIOTOMY Left 08/17/2013   Procedure: FASCIOTOMY LEFT RING;  Surgeon: GWynonia Sours MD;  [facility]  [place];  Service: Orthopedics;  Laterality: Left;   INCISION AND DRAINAGE ABSCESS Left 10/15/2015   Procedure: INCISION AND DRAINAGE ABSCESS;  Surgeon: [name], MD;  Location: [facility];  Service: General;  Laterality: Left;   KIDNEY TRANSPLANT  09/13/2010   [facility] HEART CATH N/A 11/15/2016   Procedure: RIGHT HEART CATH;  Surgeon: [name], MD;  Location: [place] [facility];  Service: Cardiovascular;  Laterality: N/A;   RIGHT HEART CATH N/A 05/03/2019   Procedure: RIGHT HEART CATH;  Surgeon: [name], MD;  Location: [facility] [facility];  Service: Cardiovascular;  Laterality: N/A;   RIGHT HEART CATH N/A 04/04/2022   Procedure: RIGHT HEART CATH;  Surgeon: [name], MD;  Location: [place] [facility];  Service: Cardiovascular;  Laterality: N/A;   ROUX-EN-Y GASTRIC BYPASS N/A 2015   TYMPANIC MEMBRANE REPAIR Left 03/2010   VASECTOMY      Family History  Problem Relation Age of Onset   Heart disease Father    Kidney failure Father    Kidney disease Father    Diabetes Maternal Grandmother    Breast cancer Maternal Grandmother    Valvular heart disease Mother    Liver cancer Paternal Uncle    Liver cancer Paternal Grandmother    Prostate cancer Neg Hx     Social History   Socioeconomic History   Marital status: Married    Spouse name: Not on file   Number of children: 2   Years of education: 14   Highest education level: Associate degree: academic program  Occupational History   Occupation: Warden/ranger: unemployed    Comment: disabled due to kidney failure  Tobacco Use   Smoking status: Former    Types: Cigars    Quit date: 09/02/1994    Years since quitting:  27.6    Passive exposure: Never   Smokeless tobacco: Never   Tobacco comments:    Occasional, rare cigars  Vaping Use   Vaping Use: Never used  Substance and Sexual Activity   Alcohol use: Yes    Alcohol/week: 0.0 - 1.0 standard drinks of alcohol    Comment: occasional - 12-pack lasts a month   Drug use: No   Sexual activity: Not on file  Other Topics Concern   Not on file  Social History Narrative   Has living will   Wife is health care POA   Would accept resuscitation attempts   No tube feedings if cognitively unaware   Social Determinants of Health   Financial Resource Strain: Not on file  Food Insecurity: No Food Insecurity (04/01/2022)   Hunger Vital Sign    Worried About Running Out of Food in the Last Year: Never true    [place] in the Last Year: Never true  Transportation Needs: No Transportation Needs (04/01/2022)   PRAPARE - Hydrologist (Medical): No    Lack of Transportation (Non-Medical): No  Physical Activity: Not on file  Stress: Not on file  Social Connections: Not on file  Intimate Partner Violence: Not At Risk (04/01/2022)   Humiliation, Afraid, Rape, and Kick questionnaire    Fear of Current or Ex-Partner: No    Emotionally Abused: No    Physically Abused: No    Sexually Abused: No   Review of Systems Bowels move fairly regularly--but has to strain Does use miralax about every other day  Objective:   Physical Exam Constitutional:      Appearance: Normal appearance.  Genitourinary:    Comments: Now has multiple warts in perirectal area (?20) Marked worsening from last visit Neurological:     Mental Status: He is alert.            Assessment & Plan:

## 2022-05-03 DIAGNOSIS — A630 Anogenital (venereal) warts: Secondary | ICD-10-CM | POA: Diagnosis not present

## 2022-05-06 ENCOUNTER — Telehealth: Payer: Self-pay

## 2022-05-06 ENCOUNTER — Telehealth (HOSPITAL_COMMUNITY): Payer: Self-pay | Admitting: *Deleted

## 2022-05-06 DIAGNOSIS — I491 Atrial premature depolarization: Secondary | ICD-10-CM | POA: Diagnosis not present

## 2022-05-06 DIAGNOSIS — R55 Syncope and collapse: Secondary | ICD-10-CM | POA: Diagnosis not present

## 2022-05-06 DIAGNOSIS — R42 Dizziness and giddiness: Secondary | ICD-10-CM | POA: Diagnosis not present

## 2022-05-06 DIAGNOSIS — Z0279 Encounter for issue of other medical certificate: Secondary | ICD-10-CM

## 2022-05-06 DIAGNOSIS — I4891 Unspecified atrial fibrillation: Secondary | ICD-10-CM | POA: Diagnosis not present

## 2022-05-06 DIAGNOSIS — I447 Left bundle-branch block, unspecified: Secondary | ICD-10-CM | POA: Diagnosis not present

## 2022-05-06 MED ORDER — HYDRALAZINE HCL 25 MG PO TABS: 12.5000 mg | ORAL_TABLET | Freq: Three times a day (TID) | ORAL | 2 refills | Status: DC

## 2022-05-06 NOTE — Telephone Encounter (Signed)
Received FMLA form for patient from his wife, Trig Parady.  This is the same as his previous FMLA form.  Form placed in your inbox for signing.  Due ASAP (no due date listed).  When form is complete, patient will pick up his copy.

## 2022-05-06 NOTE — Telephone Encounter (Signed)
Spoke with pt he is aware and agreeable with plan.

## 2022-05-06 NOTE — Telephone Encounter (Signed)
Pt left vm stating his systolic bp has been in the 90s. Pt said he is dizzy and sometimes feels like he is going to "pass out". Pt asked if he needed to make any medication changes.    Routed to [employer]

## 2022-05-06 NOTE — Telephone Encounter (Signed)
Form faxed to fax# [phone].  Received fax confirmation.  Copies made for patient, scanning, billing, and myself.  Sent msg via mychart to notify patient his copy is ready for pickup.

## 2022-05-07 ENCOUNTER — Ambulatory Visit: Payer: Self-pay | Admitting: Surgery

## 2022-05-07 ENCOUNTER — Telehealth: Payer: Self-pay | Admitting: Cardiovascular Disease

## 2022-05-07 DIAGNOSIS — A630 Anogenital (venereal) warts: Secondary | ICD-10-CM | POA: Diagnosis not present

## 2022-05-07 NOTE — H&P (Signed)
Subjective:  CC: Anal condyloma [A63.0]   HPI:  [name] is a 62 y.o. male who was referrred by Leata Mouse, PA for above. Symptoms were first noted several months ago. Bleeding and discharge from areas. Prescribed steroids but did not help.  Referred to derm but Lesions too extensive for dermatology so hear today.   Past Medical History:  has a past medical history of Allergy, Amnestic MCI (mild cognitive impairment with memory loss), Aortic atherosclerosis (CMS-HCC), Asthma, unspecified asthma severity, unspecified whether complicated, unspecified whether persistent, Chronic a-fib (CMS-HCC), Chronic venous stasis dermatitis of both lower extremities, Congestive heart failure (CHF) (CMS-HCC), Coronary artery disease, Cytomegaloviral disease (CMS-HCC), Depression, Diabetic retinopathy associated with type 2 diabetes mellitus (CMS-HCC), Diverticulosis, Erectile dysfunction, ESRD (end stage renal disease) (CMS-HCC), GERD (gastroesophageal reflux disease), Gout, History of renal failure, Hyperlipidemia, Hypertension, Ingrowing nail, Kidney transplant status, cadaveric, Morbid obesity (CMS-HCC), Multilevel degenerative disc disease, OSA on CPAP, PAH (pulmonary artery hypertension) (CMS-HCC), Pneumonia, Secondary hyperparathyroidism, renal (CMS-HCC), Sleep apnea, and Trigger finger.  Past Surgical History:  has a past surgical history that includes Laser eye surgery (2011); Transplant Kidney (08/2010); Coronary artery bypass graft (2006); Hand surgery (Left); bariatric surgery; cardiac catheterization; Cataract extraction (Bilateral); Colonoscopy; dg angio av shunt ; Fasciotomy Hand; fasciotomy hand; Repair Tympanic Membrane (Left); Vasectomy; and Urolift.  Family History: family history includes Arthritis in his sister; Breast cancer in his maternal grandmother; Diabetes in his maternal grandfather, maternal grandmother, and maternal uncle; ESRD-Transplant in his father; Heart disease in his  father; Kidney disease in his father; Kidney failure in his father; Liver cancer in his paternal grandmother and paternal uncle; Valvular heart disease in his mother.  Social History:  reports that he has quit smoking. His smoking use included cigars. He has never used smokeless tobacco. He reports current alcohol use. He reports that he does not use drugs.  Current Medications: has a current medication list which includes the following prescription(s): amlodipine, apixaban, beclomethasone, contour next test strips, freestyle libre 3 sensor, bupropion, cholecalciferol, colchicine, cyanocobalamin, dapagliflozin propanediol, febuxostat, ferrous sulfate, fluticasone propionate, furosemide, gabapentin, hydralazine, insulin glargine-yfgn, insulin lispro, lisinopril, metolazone, mometasone, montelukast, multivitamin, mycophenolate, omeprazole, pen needle, diabetic, potassium chloride, rosuvastatin, spironolactone, sulfamethoxazole-trimethoprim, tacrolimus, tadalafil, tamsulosin, torsemide, tramadol, and zinc sulfate.  Allergies:  Allergies as of 05/07/2022 - Reviewed 05/07/2022  Allergen Reaction Noted   Iodine Other (See Comments) 07/13/2019   Acetaminophen Other (See Comments) 03/25/2019   Ibuprofen Other (See Comments) 11/05/2018   Iodinated contrast media Other (See Comments) 07/13/2019    ROS:  A 15 point review of systems was performed and pertinent positives and negatives noted in HPI  Objective:     BP 119/75   Pulse 80   Ht 177.8 cm (5' 10"$ )   Wt 97.5 kg (215 lb)   BMI 30.85 kg/m   Constitutional :  No distress, cooperative, alert  Lymphatics/Throat:  Supple with no lymphadenopathy  Respiratory:  Clear to auscultation bilaterally  Cardiovascular:  Regular rate and rhythm  Gastrointestinal: Soft, non-tender, non-distended, no organomegaly.  Musculoskeletal: Steady gait and movement  Skin: Cool and moist  Psychiatric: Normal affect, non-agitated, not confused  Rectal: Chaperone  present for exam.  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  Plan:    1. Anal condyloma [A63.0]  Discussed risks/benefits/alternatives to surgery.  Alternatives include the options of observation, medical management.  Benefits include symptomatic relief.  I discussed  in detail and the complications related to the operation and the anesthesia, including bleeding, infection, recurrence, poor/delayed wound healing, chronic pain, and additional procedures to address said risks. The risks of general anesthetic, if used, includes MI, CVA, sudden death or even reaction to anesthetic medications also discussed.    The patient understands the risks, any and all questions were answered to the patient's satisfaction.  2. Patient has elected to proceed with surgical treatment. Procedure will be scheduled. Hold eliquis 3 days prior once cards ok.  labs/images/medications/previous chart entries reviewed personally and relevant changes/updates noted above.

## 2022-05-07 NOTE — H&P (View-Only) (Signed)
Subjective:  CC: Anal condyloma [A63.0]   HPI:  [name] is a 62 y.o. male who was referrred by Brenda K Sandridge, PA for above. Symptoms were first noted several months ago. Bleeding and discharge from areas. Prescribed steroids but did not help.  Referred to derm but Lesions too extensive for dermatology so hear today.   Past Medical History:  has a past medical history of Allergy, Amnestic MCI (mild cognitive impairment with memory loss), Aortic atherosclerosis (CMS-HCC), Asthma, unspecified asthma severity, unspecified whether complicated, unspecified whether persistent, Chronic a-fib (CMS-HCC), Chronic venous stasis dermatitis of both lower extremities, Congestive heart failure (CHF) (CMS-HCC), Coronary artery disease, Cytomegaloviral disease (CMS-HCC), Depression, Diabetic retinopathy associated with type 2 diabetes mellitus (CMS-HCC), Diverticulosis, Erectile dysfunction, ESRD (end stage renal disease) (CMS-HCC), GERD (gastroesophageal reflux disease), Gout, History of renal failure, Hyperlipidemia, Hypertension, Ingrowing nail, Kidney transplant status, cadaveric, Morbid obesity (CMS-HCC), Multilevel degenerative disc disease, OSA on CPAP, PAH (pulmonary artery hypertension) (CMS-HCC), Pneumonia, Secondary hyperparathyroidism, renal (CMS-HCC), Sleep apnea, and Trigger finger.  Past Surgical History:  has a past surgical history that includes Laser eye surgery (2011); Transplant Kidney (08/2010); Coronary artery bypass graft (2006); Hand surgery (Left); bariatric surgery; cardiac catheterization; Cataract extraction (Bilateral); Colonoscopy; dg angio av shunt ; Fasciotomy Hand; fasciotomy hand; Repair Tympanic Membrane (Left); Vasectomy; and Urolift.  Family History: family history includes Arthritis in his sister; Breast cancer in his maternal grandmother; Diabetes in his maternal grandfather, maternal grandmother, and maternal uncle; ESRD-Transplant in his father; Heart disease in his  father; Kidney disease in his father; Kidney failure in his father; Liver cancer in his paternal grandmother and paternal uncle; Valvular heart disease in his mother.  Social History:  reports that he has quit smoking. His smoking use included cigars. He has never used smokeless tobacco. He reports current alcohol use. He reports that he does not use drugs.  Current Medications: has a current medication list which includes the following prescription(s): amlodipine, apixaban, beclomethasone, contour next test strips, freestyle libre 3 sensor, bupropion, cholecalciferol, colchicine, cyanocobalamin, dapagliflozin propanediol, febuxostat, ferrous sulfate, fluticasone propionate, furosemide, gabapentin, hydralazine, insulin glargine-yfgn, insulin lispro, lisinopril, metolazone, mometasone, montelukast, multivitamin, mycophenolate, omeprazole, pen needle, diabetic, potassium chloride, rosuvastatin, spironolactone, sulfamethoxazole-trimethoprim, tacrolimus, tadalafil, tamsulosin, torsemide, tramadol, and zinc sulfate.  Allergies:  Allergies as of 05/07/2022 - Reviewed 05/07/2022  Allergen Reaction Noted   Iodine Other (See Comments) 07/13/2019   Acetaminophen Other (See Comments) 03/25/2019   Ibuprofen Other (See Comments) 11/05/2018   Iodinated contrast media Other (See Comments) 07/13/2019    ROS:  A 15 point review of systems was performed and pertinent positives and negatives noted in HPI  Objective:     BP 119/75   Pulse 80   Ht 177.8 cm (5' 10")   Wt 97.5 kg (215 lb)   BMI 30.85 kg/m   Constitutional :  No distress, cooperative, alert  Lymphatics/Throat:  Supple with no lymphadenopathy  Respiratory:  Clear to auscultation bilaterally  Cardiovascular:  Regular rate and rhythm  Gastrointestinal: Soft, non-tender, non-distended, no organomegaly.  Musculoskeletal: Steady gait and movement  Skin: Cool and moist  Psychiatric: Normal affect, non-agitated, not confused  Rectal: Chaperone  present for exam.  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  Plan:    1. Anal condyloma [A63.0]   Discussed risks/benefits/alternatives to surgery.  Alternatives include the options of observation, medical management.  Benefits include symptomatic relief.  I discussed  in detail and the complications related to the operation and the anesthesia, including bleeding, infection, recurrence, poor/delayed wound healing, chronic pain, and additional procedures to address said risks. The risks of general anesthetic, if used, includes MI, CVA, sudden death or even reaction to anesthetic medications also discussed.    The patient understands the risks, any and all questions were answered to the patient's satisfaction.  2. Patient has elected to proceed with surgical treatment. Procedure will be scheduled. Hold eliquis 3 days prior once cards ok.  labs/images/medications/previous chart entries reviewed personally and relevant changes/updates noted above. 

## 2022-05-07 NOTE — Telephone Encounter (Signed)
Pharmacy please advise on holding Eliquis prior to EVA procedure scheduled for 05/31/2022. Thank you.

## 2022-05-07 NOTE — Telephone Encounter (Signed)
   Pre-operative Risk Assessment    Patient Name: [name].  DOB: [birth year] MRN: [id number]      Request for Surgical Clearance    Procedure:   EVA  Date of Surgery:  Clearance 05/31/22                                 Surgeon:  Dr Chesley Noon Group or Practice Name:  [facility] Phone number:  [phone] Fax number:  [phone]   Type of Clearance Requested:   - Pharmacy:  Hold Apixaban (Eliquis) hold 3 days   Type of Anesthesia:  Not Indicated   Additional requests/questions:    [name]   05/07/2022, 3:21 PM

## 2022-05-08 ENCOUNTER — Other Ambulatory Visit: Payer: Self-pay

## 2022-05-08 MED ORDER — PREDNISONE 10 MG PO TABS
ORAL_TABLET | ORAL | 0 refills | Status: AC
  Filled 2022-05-08: qty 30, 15d supply, fill #0

## 2022-05-08 NOTE — Telephone Encounter (Signed)
Patient with diagnosis of atrial fibrillation on Eliquis for anticoagulation.    Procedure: EVA Date of procedure: 05/31/22   CHA2DS2-VASc Score = 4   This indicates a 4.8% annual risk of stroke. The patient's score is based upon: CHF History: 1 HTN History: 1 Diabetes History: 1 Stroke History: 0 Vascular Disease History: 1 Age Score: 0 Gender Score: 0    CrCl 53 Platelet count 116  Per office protocol, patient can hold Eliquis for 3 days prior to procedure.   Patient will not need bridging with Lovenox (enoxaparin) around procedure.  **This guidance is not considered finalized until pre-operative APP has relayed final recommendations.**

## 2022-05-08 NOTE — Telephone Encounter (Signed)
   Patient Name: [name].  DOB: [birth year] MRN: [id number]  Primary Cardiologist: [name], MD  Clinical pharmacists have reviewed the patient's past medical history, labs, and current medications as part of preoperative protocol coverage. The following recommendations have been made:  Procedure: EVA Date of procedure: 05/31/22     CHA2DS2-VASc Score = 4   This indicates a 4.8% annual risk of stroke. The patient's score is based upon: CHF History: 1 HTN History: 1 Diabetes History: 1 Stroke History: 0 Vascular Disease History: 1 Age Score: 0 Gender Score: 0     CrCl 53 Platelet count 116   Per office protocol, patient can hold Eliquis for 3 days prior to procedure.   Patient will not need bridging with Lovenox (enoxaparin) around procedure.  I will route this recommendation to the requesting party via Epic fax function and remove from pre-op pool.  Please call with questions.  Mable Fill, Marissa Nestle, NP 05/08/2022, 9:31 AM

## 2022-05-09 ENCOUNTER — Ambulatory Visit (HOSPITAL_BASED_OUTPATIENT_CLINIC_OR_DEPARTMENT_OTHER): Payer: Commercial Managed Care - PPO | Admitting: Cardiology

## 2022-05-09 ENCOUNTER — Other Ambulatory Visit
Admission: RE | Admit: 2022-05-09 | Discharge: 2022-05-09 | Disposition: A | Discharge status: Home / Self Care | Payer: Commercial Managed Care - PPO | Source: Ambulatory Visit | Attending: Cardiology | Admitting: Cardiology

## 2022-05-09 VITALS — BP 131/78 | HR 87 | Wt 252.5 lb

## 2022-05-09 DIAGNOSIS — I5022 Chronic systolic (congestive) heart failure: Secondary | ICD-10-CM | POA: Diagnosis not present

## 2022-05-09 DIAGNOSIS — N1832 Chronic kidney disease, stage 3b: Secondary | ICD-10-CM

## 2022-05-09 DIAGNOSIS — I482 Chronic atrial fibrillation, unspecified: Secondary | ICD-10-CM

## 2022-05-09 DIAGNOSIS — I2510 Atherosclerotic heart disease of native coronary artery without angina pectoris: Secondary | ICD-10-CM

## 2022-05-09 DIAGNOSIS — I5023 Acute on chronic systolic (congestive) heart failure: Secondary | ICD-10-CM

## 2022-05-09 LAB — LIPID PANEL
Cholesterol: 90 mg/dL (ref 0–200)
HDL: 37 mg/dL — ABNORMAL LOW (ref >40)
LDL Cholesterol: 25 mg/dL (ref 0–99)
Total CHOL/HDL Ratio: 2.4 RATIO
Triglycerides: 140 mg/dL (ref <150)
VLDL: 28 mg/dL (ref 0–40)

## 2022-05-09 LAB — BASIC METABOLIC PANEL
Anion gap: 13 (ref 5–15)
BUN: 101 mg/dL — ABNORMAL HIGH (ref 8–23)
CO2: 33 mmol/L — ABNORMAL HIGH (ref 22–32)
Calcium: 9.3 mg/dL (ref 8.9–10.3)
Chloride: 88 mmol/L — ABNORMAL LOW (ref 98–111)
Creatinine, Ser: 2.75 mg/dL — ABNORMAL HIGH (ref 0.61–1.24)
GFR, Estimated: 25 mL/min — ABNORMAL LOW (ref >60)
Glucose, Bld: 225 mg/dL — ABNORMAL HIGH (ref 70–99)
Potassium: 3.4 mmol/L — ABNORMAL LOW (ref 3.5–5.1)
Sodium: 134 mmol/L — ABNORMAL LOW (ref 135–145)

## 2022-05-09 LAB — BRAIN NATRIURETIC PEPTIDE: B Natriuretic Peptide: 352.3 pg/mL — ABNORMAL HIGH (ref 0.0–100.0)

## 2022-05-09 NOTE — Patient Instructions (Addendum)
Medication Changes:  STOP taking Hydralazine 12.5 mg daily STOP taking Imdur (isosorbide mononitrate) 15 mg daily HOLD Metolazone tomorrow  Lab Work:  Labs done today, your results will be available in MyChart, we will contact you for abnormal readings.   Special Instructions // Education:  Do the following things EVERYDAY: Weigh yourself in the morning before breakfast. Write it down and keep it in a log. Take your medicines as prescribed Eat low salt foods--Limit salt (sodium) to 2000 mg per day.  Stay as active as you can everyday Limit all fluids for the day to less than 2 liters   Follow-Up in: 3 weeks with [name]    If you have any questions or concerns before your next appointment please send [name] a message through [facility] or call our office at [phone] Monday-Friday 8 am-5 pm.   If you have an urgent need after hours on the weekend please call your Primary Cardiologist or the [facility] in [place] at [phone].

## 2022-05-09 NOTE — Progress Notes (Signed)
Advanced Heart Failure Clinic Note   Date:  05/09/2022   ID:  [name]., DOB July 19, 1960, MRN [id number]   Provider location: [address] [place] Type of Visit:  Established patient  PCP:  [name], MD  Cardiologist:  [name] Nephrology: Dr. Ronnald Ramp  HF Cardiologist: [name]  HPI: [name]. is a 62 y.o. male  with a history of CAD s/p CABG 2005, chronic atrial fibrillation on Eliquis, ESRD s/p renal transplant 2012, anc chronic diastolic CHF.  His renal function has been fairly stable, most recent creatinine 1.43.  He has been struggling with volume overload/CHF.  He has a history of ischemic cardiomyopathy with EF as low as 35-40% in the past but echo in 3/20 showed EF 60-65%.  There is evidence for RV failure with pulmonary hypertension by echo.    Admitted 11/05/18 low grade fever and shortness of breath. HS Trop negative. Covid 19 negative. Blood CX negative. Placed on antibiotics and discharged to home the next day.   Admitted [id number] with A/C Diastolic HF. Diuresed with IV lasix and transitioned to torsemide 80/40 mg . Discharge weight 252 pounds.   Admitted 06/2019 with left arm pain. Left upper extremity venous Doppler revealed occluded cephalic vein at the AV fistula outflow tract in the antecubital fossa with no other thrombus identified. Vascular evaluated. He continued on Eliquis.  No plan for intervention. Also treated for acute gout flare. Discharged on prednisone taper.     Given IV lasix on 12/21 by HF Paramedicine.   Admitted to [facility] 2/22 with acute gout flare vs cellulitis. Nephrology was consulted to evaluate transplant regimen and follow along. He was treated with antibiotics and prednisone. Discharged on decreased torsemide dose of 40 mg daily. Weight 259 lbs.  Admitted 4/22 for left leg cellulitis after sustaining a dog scratch, received IV antibiotics. No changes to his diuretic therapy. Discharge weight 266  lbs.  Follow up with [name], 1/23, he had CP and underwent Lexiscan myoview, which overall was a low risk study with evidence of prior infarct in the infero-apical area with no significant change from before. Losartan added to GDMT regimen.  Follow up 5/23, volume overloaded, REDs 40%. Advised taking additional metolazone (3 doses that week), however labs showed worsening renal function and advised to keep at 2 doses.  Admitted in 5/23 with acute on chronic CHF and AKI.  Gareth Eagle and losartan stopped. Diuresed with IV lasix. Echo showed EF 40-45%, RV mildly reduced, GIIIDD (restrictive), IVC dilated. Nephrology consulted for diuretic dosing. Discharged home, weight 266 lbs.  He had chest pain in 8/23 and was admitted at [facility], minimal HS-TnI elevation with no trend, thought to be demand ischemia from volume overload.    Echo 8/23 showing EF 40-45%, diffuse hypokinesis with dyssynchrony, mildly dilated RV with mildly decreased systolic function, IVC dilated, moderate AS with mean gradient 15, mild-moderate mitral stenosis with mean gradient 6.   Follow up 03/27/22, volume overloaded with 25 lbs weight gain. Instructed to use Furoscix x 3 days, then resume home dose of torsemide + metolazone. Clinic weight 277 lbs.  Admitted from AHF 04/01/22 with a/c CHF. Echo showed EF 40-45%, RV moderately reduced and RVSP 49 mmHg. RHC showed elevated filling pressures, moderate primarily pulmonary venous hypertension and preserved CO. Tadalafil stopped with primarily [place] group 2 and 3. He was diuresed with IV lasix and metolazone, and GDMT added back. Overall down 19 lbs. He was  discharged home, weight 255 lbs.  Today he returns for HF follow up. Weight is down about 12 lbs.  His breathing is ok, he can walk a mile on flat ground without dyspnea.  His main complaint is lightheadedness/dizziness.  This is really bothering him when he stands and walks.  No syncope or falls.  He was actually not orthostatic when  checked today. He feels like he has been doing worse since starting Imdur. No chest pain or palpitations.   Labs (7/19): LDL 49 Labs (2/20): K 3.7, creatinine 1.43 Labs (4/20): K 4.3, creatinine 1.24 Labs (11/06/18): K 3.7 creatinine 1.23  Labs (05/07/19): K 3.8 Creatinine 1.5  Labs (07/18/19): K 4.3 Creatinine 1.42  Labs (5/21): K 3.3, creatinine 1.21 Labs (11/21): K 3.6, creatinine 1.4 Labs (12/21): K 3.3, creatinine 2.4, hgb 12.8 Labs (04/01/19): K 3.3 Creatinine 2.2  Labs (8/22): K 4.0, creatinine 1.6, HDL 35, LDL 36, a1c 7.5 Labs (2/23): LDL 31, HDL 36 Labs (4/23): K 3.3, creatinine 1.68 Labs (5/23): K 4.1, creatinine 2.24 Labs (8/23): K 3.5, creatinine 2.36 Labs (12/23): K 3.6, creatinine 1.99, hgb 9.3 Labs (1/24): K 4.6, creatinine 2.44  PMH: 1. CAD: s/p CABG x 4 in [place] in 2005.  - Cardiolite (1/20): EF 47%, small fixed apical septal defect, no ischemia.  Low risk study.  - Cardiolite (12/22): small apical inferior defect, no changes from prior, no ischemia. Low risk study. 2. Asthma 3. Gout 4. Atrial fibrillation: Chronic.  5. ESRD s/p renal transplantation at [facility] in 2012.  6. Anemia of chronic disease 7. Type II diabetes 8. HTN 9. Hyperlipidemia 10. OSA: On bipap.  11. Ischemic cardiomyopathy: EF 35-40% in past.  - Cardiolite (1/20) with EF 47%.  - Echo (1/20): EF 50-55%, mild MR, RV moderately dilated with moderately decreased systolic function, PASP 73 mmHg, moderate TR.   - Echo (3/20): EF 60-65%, PASP 75 mmHg, mildly dilated RV with mildly decreased systolic function.  - Echo (2/21): EF 45-50%, global HK, moderately dilated RV with moderately decreased systolic function, D-shaped interventricular septum, dilated IVC.  - RHC (2/21): mean RA 19, PA 78/31 mean 51, mean PCWP 26, CI 3.26, PVR 3.25 - Echo (5/23): EF 40-45%, RV mildly reduced, GIIIDD (restrictive) - Echo (8/23): EF 40-45%, diffuse hypokinesis with dyssynchrony, mildly dilated RV with mildly decreased  systolic function, IVC dilated, moderate AS with mean gradient 15, mild-moderate mitral stenosis with mean gradient 6.  - Echo (1/24): Echo showed EF 40-45%, RV moderately reduced and RVSP 49 mmHg, mild MR, no AS, dilated IVC.  - RHC (1/24): RA mean 13, PA 62/24 (mean 40), PCWP mean 20, CO/CI (Fick) 7.63/3.26, PVR 2.6 WU, PAPi 2.9 12. Obesity: s/p gastric bypass in 2015.  13. Bradycardia with beta blocker.  14. Aortic stenosis: Moderate on 8/23 echo.  15. Mitral stenosis: Mild to moderate on 8/23 echo.  16. Rheumatoid arthritis  Past Surgical History:  Procedure Laterality Date   CARDIAC CATHETERIZATION N/A 08/10/2002   CATARACT EXTRACTION W/PHACO Right 10/29/2017   Procedure: CATARACT EXTRACTION PHACO AND INTRAOCULAR LENS PLACEMENT ([place])  RIGHT DIABETIC;  Surgeon: [name], MD;  Location: [place];  Service: Ophthalmology;  Laterality: Right;  Diabetic - insulin   CATARACT EXTRACTION W/PHACO Left 11/18/2017   Procedure: CATARACT EXTRACTION PHACO AND INTRAOCULAR LENS PLACEMENT ([place]) LEFT IVA/TOPICAL;  Surgeon: [name], MD;  Location: [place];  Service: Ophthalmology;  Laterality: Left;  DIABETES - insulin sleep apnea   COLONOSCOPY WITH PROPOFOL N/A 04/22/2013  Procedure: COLONOSCOPY WITH PROPOFOL;  Surgeon: [name], MD;  Location: [facility];  Service: Endoscopy;  Laterality: N/A;   CORONARY ANGIOPLASTY WITH STENT PLACEMENT N/A 1996   CORONARY ARTERY BYPASS GRAFT N/A 08/13/2002   Procedure: 4v CORONARY ARTERY BYPASS GRAFTING; Location: [name]; Surgeon: [name], MD   CYSTOSCOPY WITH INSERTION OF UROLIFT N/A 12/25/2021   Procedure: CYSTOSCOPY WITH INSERTION OF UROLIFT;  Surgeon: [name], MD;  Location: [facility];  Service: Urology;  Laterality: N/A;   DG ANGIO AV SHUNT*L*     right and left upper arms   FASCIOTOMY  03/03/2012   Procedure: FASCIOTOMY;  Surgeon: [name], MD;  Location: [place];   Service: Orthopedics;  Laterality: Right;  FASCIOTOMY RIGHT SMALL FINGER   FASCIOTOMY Left 08/17/2013   Procedure: FASCIOTOMY LEFT RING;  Surgeon: [name], MD;  Location: [place];  Service: Orthopedics;  Laterality: Left;   INCISION AND DRAINAGE ABSCESS Left 10/15/2015   Procedure: INCISION AND DRAINAGE ABSCESS;  Surgeon: [name], MD;  Location: [facility];  Service: General;  Laterality: Left;   KIDNEY TRANSPLANT  09/13/2010   cadaver--[facility] HEART CATH N/A 11/15/2016   Procedure: RIGHT HEART CATH;  Surgeon: [name], MD;  Location: [place] [facility];  Service: Cardiovascular;  Laterality: N/A;   RIGHT HEART CATH N/A 05/03/2019   Procedure: RIGHT HEART CATH;  Surgeon: [name], MD;  Location: [place] [facility];  Service: Cardiovascular;  Laterality: N/A;   RIGHT HEART CATH N/A 04/04/2022   Procedure: RIGHT HEART CATH;  Surgeon: [name], MD;  Location: [place] [facility];  Service: Cardiovascular;  Laterality: N/A;   ROUX-EN-Y GASTRIC BYPASS N/A 2015   TYMPANIC MEMBRANE REPAIR Left 03/2010   VASECTOMY     Current Outpatient Medications  Medication Sig Dispense Refill   albuterol (PROVENTIL) (2.5 MG/3ML) 0.083% nebulizer solution Take 3 mLs (2.5 mg total) by nebulization every 6 (six) hours as needed for wheezing or shortness of breath. 150 mL 0   albuterol (VENTOLIN HFA) 108 (90 Base) MCG/ACT inhaler Inhale 2 puffs into the lungs every 6 (six) hours as needed for wheezing or shortness of breath. 18 g 5   allopurinol (ZYLOPRIM) 100 MG tablet Take 100 mg by mouth daily.     AMBULATORY NON FORMULARY MEDICATION Trimix (30/1/10)-(Pap/Phent/PGE)  Test Dose  38m vial   Qty #3 Refills 0  [facility] [phone] [phone] mL 0   apixaban (ELIQUIS) 5 MG TABS tablet TAKE 1 TABLET BY MOUTH TWICE DAILY 180 tablet 1   beclomethasone (QVAR) 80 MCG/ACT inhaler Inhale 2 puffs into the lungs daily as needed (for  shortness of breath).     buPROPion (WELLBUTRIN SR) 150 MG 12 hr tablet TAKE 1 TABLET BY MOUTH TWICE DAILY 180 tablet 3   cholecalciferol (VITAMIN D3) 25 MCG (1000 UNIT) tablet Take 1,000 Units by mouth at bedtime.     colchicine 0.6 MG tablet Take 1 tablet (0.6 mg total) by mouth every other day 30 tablet 1   Continuous Blood Gluc Sensor (FREESTYLE LIBRE 3 SENSOR) MISC Use one every 2 weeks. 6 each 3   dapagliflozin propanediol (FARXIGA) 10 MG TABS tablet Take 1 tablet (10 mg total) by mouth daily before breakfast. 90 tablet 3   febuxostat (ULORIC) 40 MG tablet Take 3 tablets (120 mg total) by mouth once daily 90 tablet 5   fluticasone (FLONASE) 50  MCG/ACT nasal spray PLACE 2 SPRAYS INTO BOTH NOSTRILS DAILY. 16 g 11   gabapentin (NEURONTIN) 300 MG capsule TAKE 1 CAPSULE BY MOUTH TWICE DAILY 180 capsule 3   hydrocortisone 2.5 % cream Apply topically 3 (three) times daily as needed. 30 g 3   insulin glargine-yfgn (SEMGLEE) 100 UNIT/ML Pen Inject 35 Units into the skin daily. 45 mL 3   insulin lispro (HUMALOG KWIKPEN) 100 UNIT/ML KwikPen INJECT UP TO 50 UNITS UNDER THE SKIN DAILY AS DIRECTED (Patient taking differently: Inject 10-20 Units into the skin 3 (three) times daily. Per sliding scale) 45 mL 3   Insulin Pen Needle (UNIFINE PENTIPS) 31G X 5 MM MISC Use 4 times daily as directed 400 each 3   iron polysaccharides (FERREX 150) 150 MG capsule TAKE 1 CAPSULE BY MOUTH 2 TIMES DAILY. (Patient taking differently: Take 150 mg by mouth daily.) 180 capsule 3   lidocaine (LIDODERM) 5 % Place 1 patch onto the skin daily as needed (for pain).     lidocaine (LIDODERM) 5 % Place 1 patch onto the skin daily.PLACE 1 PATCH ONTO THE SKIN FOR 12 (TWELVE) HOURS. REMOVE & DISCARD PATCH WITHIN 12 HOURS OR AS DIRECTED BY MD 60 patch 11   metolazone (ZAROXOLYN) 2.5 MG tablet Take 1 tablet by mouth 3 times weekly (Monday, Wednesday, Friday). 14 tablet 3   montelukast (SINGULAIR) 10 MG tablet TAKE 1 TABLET BY MOUTH AT  BEDTIME. (Patient taking differently: Take 10 mg by mouth at bedtime.) 90 tablet 3   Multiple Vitamin (MULTIVITAMIN WITH MINERALS) TABS tablet Take 1 tablet by mouth daily.     mycophenolate (MYFORTIC) 180 MG EC tablet Take 2 tablets (360 mg total) by mouth 2 (two) times daily. 360 tablet 3   omeprazole (PRILOSEC) 20 MG capsule TAKE 1 CAPSULE BY MOUTH DAILY. (Patient taking differently: Take 20 mg by mouth daily.) 90 capsule 3   potassium chloride (KLOR-CON) 10 MEQ tablet Take 6 tablets (60 mEq total) by mouth 2 (two) times daily. 360 tablet 8   predniSONE (DELTASONE) 10 MG tablet Take 3 tablets (30 mg total) by mouth daily for 5 days, THEN 2 tablets (20 mg total) daily for 5 days, THEN 1 tablet (10 mg total) daily for 5 days. 30 tablet 0   predniSONE (DELTASONE) 5 MG tablet Take 1 tablet (5 mg total) by mouth daily. 90 tablet 3   rosuvastatin (CRESTOR) 10 MG tablet Take 1 tablet (10 mg total) by mouth daily. 90 tablet 3   sulfamethoxazole-trimethoprim (BACTRIM) 400-80 MG tablet TAKE 1 TABLET BY MOUTH EVERY MONDAY, WEDNESDAY, FRIDAY. (Patient taking differently: Take 1 tablet by mouth See admin instructions. Take one tablet by mouth on Monday, Wednesday and Fridays) 36 tablet 3   tacrolimus (PROGRAF) 1 MG capsule Take 2 capsules (2 mg total) by mouth 2 times daily. 120 capsule 11   tamsulosin (FLOMAX) 0.4 MG CAPS capsule TAKE 1 CAPSULE BY MOUTH DAILY. 90 capsule 3   Testosterone 10 MG/ACT (2%) GEL 1 pump applied to inner thigh daily- 2 pumps total (Patient taking differently: Inject 1 Application into the skin daily.) 60 g 0   torsemide (DEMADEX) 100 MG tablet Take 1 tablet (100 mg total) by mouth 2 (two) times daily. 180 tablet 3   zinc sulfate 220 (50 Zn) MG capsule Take 220 mg by mouth daily.     No current facility-administered medications for this visit.   Allergies:   Contrast media [iodinated contrast media], Iodine, and Ibuprofen   Social History:  The patient  reports that he quit smoking  about 27 years ago. His smoking use included cigars. He has never been exposed to tobacco smoke. He has never used smokeless tobacco. He reports current alcohol use. He reports that he does not use drugs.   Family History:  The patient's family history includes Breast cancer in his maternal grandmother; Diabetes in his maternal grandmother; Heart disease in his father; Kidney disease in his father; Kidney failure in his father; Liver cancer in his paternal grandmother and paternal uncle; Valvular heart disease in his mother.   ROS:  Please see the history of present illness.   All other systems are personally reviewed and negative.   Recent Labs: 11/02/2021: Magnesium 2.1 04/01/2022: ALT 16; Platelets 116; TSH 2.828 04/04/2022: Hemoglobin 10.9; Hemoglobin 10.5 05/09/2022: B Natriuretic Peptide 352.3; BUN 101; Creatinine, Ser 2.75; Potassium 3.4; Sodium 134  Personally reviewed   Wt Readings from Last 3 Encounters:  05/09/22 252 lb 8 oz (114.5 kg)  05/01/22 264 lb (119.7 kg)  04/30/22 250 lb (113.4 kg)    BP 131/78 (Patient Position: Standing)   Pulse 87   Wt 252 lb 8 oz (114.5 kg)   SpO2 93%   BMI 36.23 kg/m  General: NAD Neck: No JVD, no thyromegaly or thyroid nodule.  Lungs: Clear to auscultation bilaterally with normal respiratory effort. CV: Nondisplaced PMI.  Heart irregular S1/S2, no S3/S4, 2/6 SEM RUSB.  Trace ankle edema.  No carotid bruit.  Normal pedal pulses.  Abdomen: Soft, nontender, no hepatosplenomegaly, no distention.  Skin: Intact without lesions or rashes.  Neurologic: Alert and oriented x 3.  Psych: Normal affect. Extremities: No clubbing or cyanosis.  HEENT: Normal.   Assessment & Plan: 1. Chronic heart failure with Mid Range EF/ With prominent RV failure/pulmonary hypertension:  Ischemic cardiomyopathy.  ?If RV failure is related to severe OSA, he was a remote smoker. Echo in 2/21 with EF 45-50%, moderately dilated RV with moderately decreased systolic function,  moderate pulmonary hypertension. Echo in 1/24 showed EF 40-45% , RV mod reduced, RVSP 49 mmHg, no AS, mild MR. Management complicated by CKD stage 3b.  Today, NYHA class II in terms of dyspnea but has prominent lightheadedness/dizziness.  He is not orthostatic.  He looks euvolemic on exam today.  - Continue torsemide 100 mg bid + metolazone 2.5 mg three times/week.  BMET/BNP today, if creatinine is significantly elevated from prior, may need to cut back diuresis.  - Continue 60 KCL bid. - With the lightheadedness, even though BP is not currently low, I am going to stop hydralazine/Imdur for now.  - Continue Farxiga 10 mg daily. - He is now off ARB and spironolactone with elevated creatinine.   - Tadalafil was stopped during recent admission (think [place] is group 2,3). 2. Atrial fibrillation: Chronic. Rate controlled.  - Continue Eliquis. No bleeding issues. - Not on beta blocker with history of bradycardia.   3. CAD: S/p CABG 2005.  Cardiolite 12/22 with prior MI, no ischemia.  No chest pain. High threshold for cath given CKD.  - No ASA given Eliquis use.  - Continue Crestor 10 mg daily, check lipids today. 4. CKD Stage 3b: S/p renal transplantation in 2012. Followed by [facility] center [facility]. He remains on mycophenolate and tacrolimus.    - BMET today. 5. OSA: Continue Bipap every night.  6. HTN: BP not elevated.  7. DM 2: On insulin. He follows with Endocrinology. - Continue dapagliflozin for now. 8. Pulmonary hypertension: Primarily  pulmonary venous hypertension by RHC in 1/24 though there may be group 3 PH component from OSA.  - Now off tadalafil.  9. Chronic Anemia: has been getting scheduled weekly IV Fe infusions, q Fridays   Followup in 3 wks with me.   Loralie Champagne 05/09/2022

## 2022-05-10 ENCOUNTER — Other Ambulatory Visit: Payer: Self-pay

## 2022-05-10 ENCOUNTER — Telehealth (HOSPITAL_COMMUNITY): Payer: Self-pay | Admitting: Surgery

## 2022-05-10 DIAGNOSIS — N184 Chronic kidney disease, stage 4 (severe): Secondary | ICD-10-CM | POA: Diagnosis not present

## 2022-05-10 DIAGNOSIS — D631 Anemia in chronic kidney disease: Secondary | ICD-10-CM | POA: Diagnosis not present

## 2022-05-10 MED ORDER — METOLAZONE 2.5 MG PO TABS
ORAL_TABLET | ORAL | 3 refills | Status: DC
  Filled 2022-05-10: qty 14, 84d supply, fill #0

## 2022-05-10 MED ORDER — TORSEMIDE 20 MG PO TABS
80.0000 mg | ORAL_TABLET | Freq: Two times a day (BID) | ORAL | 6 refills | Status: DC
  Filled 2022-05-10: qty 240, 30d supply, fill #0

## 2022-05-10 NOTE — Telephone Encounter (Signed)
-----   Message from [name], MD sent at 05/09/2022  2:26 PM EST ----- No diuretic (torsemide or metolazone) tomorrow.  Restart torsemide Saturday at 80 mg bid.  Decrease metolazone to twice a week on Monday and Friday.  Keep sodium in diet low.

## 2022-05-10 NOTE — Telephone Encounter (Signed)
I called patient to review results and recommendations per [name].  He is aware and agreeable.  I have updated med list in [facility].

## 2022-05-13 ENCOUNTER — Other Ambulatory Visit: Payer: Self-pay

## 2022-05-14 ENCOUNTER — Other Ambulatory Visit: Payer: Self-pay

## 2022-05-15 DIAGNOSIS — E113293 Type 2 diabetes mellitus with mild nonproliferative diabetic retinopathy without macular edema, bilateral: Secondary | ICD-10-CM | POA: Diagnosis not present

## 2022-05-15 DIAGNOSIS — I10 Essential (primary) hypertension: Secondary | ICD-10-CM | POA: Diagnosis not present

## 2022-05-15 DIAGNOSIS — Z794 Long term (current) use of insulin: Secondary | ICD-10-CM | POA: Diagnosis not present

## 2022-05-15 DIAGNOSIS — E1122 Type 2 diabetes mellitus with diabetic chronic kidney disease: Secondary | ICD-10-CM | POA: Diagnosis not present

## 2022-05-15 DIAGNOSIS — E1159 Type 2 diabetes mellitus with other circulatory complications: Secondary | ICD-10-CM | POA: Diagnosis not present

## 2022-05-15 DIAGNOSIS — E669 Obesity, unspecified: Secondary | ICD-10-CM | POA: Diagnosis not present

## 2022-05-15 DIAGNOSIS — N184 Chronic kidney disease, stage 4 (severe): Secondary | ICD-10-CM | POA: Diagnosis not present

## 2022-05-15 DIAGNOSIS — E1169 Type 2 diabetes mellitus with other specified complication: Secondary | ICD-10-CM | POA: Diagnosis not present

## 2022-05-15 DIAGNOSIS — Z940 Kidney transplant status: Secondary | ICD-10-CM | POA: Diagnosis not present

## 2022-05-15 DIAGNOSIS — E1142 Type 2 diabetes mellitus with diabetic polyneuropathy: Secondary | ICD-10-CM | POA: Diagnosis not present

## 2022-05-19 DIAGNOSIS — M25521 Pain in right elbow: Secondary | ICD-10-CM | POA: Diagnosis not present

## 2022-05-22 ENCOUNTER — Encounter (HOSPITAL_COMMUNITY): Payer: Commercial Managed Care - PPO

## 2022-05-23 ENCOUNTER — Encounter
Admission: RE | Admit: 2022-05-23 | Discharge: 2022-05-23 | Disposition: A | Discharge status: Home / Self Care | Payer: Commercial Managed Care - PPO | Source: Ambulatory Visit | Attending: Surgery | Admitting: Surgery

## 2022-05-23 ENCOUNTER — Telehealth: Payer: Self-pay | Admitting: *Deleted

## 2022-05-23 VITALS — Ht 70.0 in | Wt 216.9 lb

## 2022-05-23 DIAGNOSIS — Z01818 Encounter for other preprocedural examination: Secondary | ICD-10-CM

## 2022-05-23 DIAGNOSIS — G4733 Obstructive sleep apnea (adult) (pediatric): Secondary | ICD-10-CM | POA: Diagnosis not present

## 2022-05-23 DIAGNOSIS — E662 Morbid (severe) obesity with alveolar hypoventilation: Secondary | ICD-10-CM | POA: Diagnosis not present

## 2022-05-23 HISTORY — DX: Unspecified mood (affective) disorder: F39

## 2022-05-23 HISTORY — DX: Acute pancreatitis without necrosis or infection, unspecified: K85.90

## 2022-05-23 HISTORY — DX: Anogenital (venereal) warts: A63.0

## 2022-05-23 HISTORY — DX: Myocardial infarction type 2: I21.A1

## 2022-05-23 NOTE — Telephone Encounter (Signed)
-----   Message from Karen Kitchens, NP sent at 05/23/2022  2:50 PM EST ----- Regarding: Request for pre-operative cardiac clearance Request for pre-operative cardiac clearance:  1. What type of surgery is being performed?  EXAM UNDER ANESTHESIA; FULGURATION ANAL WART  2. When is this surgery scheduled?  05/31/2022  3. Type of clearance being requested (medical, pharmacy, both)? BOTH   4. Are there any medications that need to be held prior to surgery? APIXABAN  5. Practice name and name of physician performing surgery?  Performing surgeon: Dr. Benjamine Sprague, DO Requesting clearance: Honor Loh, FNP-C    6. Anesthesia type (none, local, MAC, general)? GENERAL  7. What is the office phone and fax number?   Phone: [phone] Fax: [phone]  ATTENTION: Unable to create telephone message as per your standard workflow. Directed by [facility] providers to send requests for cardiac clearance to this pool for appropriate distribution to provider covering pre-operative clearances.   Honor Loh, MSN, APRN, FNP-C, CEN [facility]  Peri-operative Services Nurse Practitioner Phone: [phone] 05/23/22 2:50 PM

## 2022-05-23 NOTE — Patient Instructions (Addendum)
Your procedure is scheduled on:05-31-22 Friday Report to the Registration Desk on the 1st floor of the [name].Then proceed to the 2nd floor Surgery Desk To find out your arrival time, please call [phone] between 1PM - 3PM on:05-30-22 Thursday If your arrival time is 6:00 am, do not arrive before that time as the [facility] entrance doors do not open until 6:00 am.  REMEMBER: Instructions that are not followed completely may result in serious medical risk, up to and including death; or upon the discretion of your surgeon and anesthesiologist your surgery may need to be rescheduled.  Do not eat food after midnight the night before surgery.  No gum chewing or hard candies.  You may however, drink Water up to 2 hours before you are scheduled to arrive for your surgery. Do not drink anything within 2 hours of your scheduled arrival time.  One week prior to surgery: Stop Anti-inflammatories (NSAIDS) such as Advil, Aleve, Ibuprofen, Motrin, Naproxen, Naprosyn and Aspirin based products such as Excedrin, Goody's Powder, BC Powder.You may however,  take Tylenol if needed for pain up until the day of surgery.  Stop ANY OVER THE COUNTER supplements/vitamins NOW (05-23-22) until after surgery (Multivitamin, Vitamin D3 and Zinc)-Continue your Potassium up until the day prior to surgery  Stop your apixaban (ELIQUIS) 3 days prior to surgery as instructed by your cardiologist-Last dose will be on 05-27-22 Monday  Stop your dapagliflozin propanediol (FARXIGA) 3 days prior to surgery-Last dose will be on 05-27-22 Monday  TAKE ONLY THESE MEDICATIONS THE MORNING OF SURGERY WITH A SIP OF WATER: -allopurinol (ZYLOPRIM)  -buPROPion (WELLBUTRIN SR)  -febuxostat (ULORIC)  -gabapentin (NEURONTIN)  -mycophenolate (MYFORTIC)  -omeprazole (PRILOSEC)  -predniSONE (DELTASONE)  -tacrolimus (PROGRAF)  -tamsulosin (FLOMAX)   Use your Albuterol Nebulizer the day of surgery and bring your Albuterol Inhaler to the  hospital  Take half of your insulin glargine-yfgn (SEMGLEE-17.5 units) the night prior to surgery and NO Insulin the morning of surgery  No Alcohol for 24 hours before or after surgery.  No Smoking including e-cigarettes for 24 hours before surgery.  No chewable tobacco products for at least 6 hours before surgery.  No nicotine patches on the day of surgery.  Do not use any "recreational" drugs for at least a week (preferably 2 weeks) before your surgery.  Please be advised that the combination of cocaine and anesthesia may have negative outcomes, up to and including death. If you test positive for cocaine, your surgery will be cancelled.  On the morning of surgery brush your teeth with toothpaste and water, you may rinse your mouth with mouthwash if you wish. Do not swallow any toothpaste or mouthwash.  Do not wear jewelry, make-up, hairpins, clips or nail polish.  Do not wear lotions, powders, or perfumes.   Do not shave body hair from the neck down 48 hours before surgery.  Contact lenses, hearing aids and dentures may not be worn into surgery.  Do not bring valuables to the hospital. [facility] is not responsible for any missing/lost belongings or valuables.   Total Shoulder Arthroplasty:  use Benzoyl Peroxide 5% Gel as directed on instruction sheet.  Bring your Bi-PAP to the hospital   Notify your doctor if there is any change in your medical condition (cold, fever, infection).  Wear comfortable clothing (specific to your surgery type) to the hospital.  After surgery, you can help prevent lung complications by doing breathing exercises.  Take deep breaths and cough every 1-2 hours. Your  doctor may order a device called an Incentive Spirometer to help you take deep breaths. When coughing or sneezing, hold a pillow firmly against your incision with both hands. This is called "splinting." Doing this helps protect your incision. It also decreases belly discomfort.  If you are  being admitted to the hospital overnight, leave your suitcase in the car. After surgery it may be brought to your room.  In case of increased patient census, it may be necessary for you, the patient, to continue your postoperative care in the Same Day Surgery department.  If you are being discharged the day of surgery, you will not be allowed to drive home. You will need a responsible individual to drive you home and stay with you for 24 hours after surgery.   If you are taking public transportation, you will need to have a responsible individual with you.  Please call the [place] Dept. at [phone] if you have any questions about these instructions.  Surgery Visitation Policy:  Patients undergoing a surgery or procedure may have two family members or support persons with them as long as the person is not COVID-19 positive or experiencing its symptoms.   Due to an increase in RSV and influenza rates and associated hospitalizations, children ages 47 and under will not be able to visit patients in [facility]. Masks continue to be strongly recommended.

## 2022-05-24 ENCOUNTER — Telehealth: Payer: Self-pay | Admitting: Nurse Practitioner

## 2022-05-24 NOTE — Telephone Encounter (Signed)
Patient with diagnosis of atrial fibrillation on Eliquis for anticoagulation.    Procedure: fulguration anal wart Date of procedure: 05/31/22   CHA2DS2-VASc Score = 4   This indicates a 4.8% annual risk of stroke. The patient's score is based upon: CHF History: 1 HTN History: 1 Diabetes History: 1 Stroke History: 0 Vascular Disease History: 1 Age Score: 0 Gender Score: 0    CrCl 45 Platelet count 116  Per office protocol, patient can hold Eliquis for 3 days prior to procedure.   Patient will not need bridging with Lovenox (enoxaparin) around procedure.  **This guidance is not considered finalized until pre-operative APP has relayed final recommendations.**

## 2022-05-24 NOTE — Telephone Encounter (Signed)
Fax received from Dr. Benjamine Sprague with [facility] to perform a FULGURATION ANAL WART on patient.  Patient needs surgery clearance. Surgery is 05/31/2022. Patient was seen on 12/06/2021. Office protocol is a risk assessment can be sent to surgeon if patient has been seen in 60 days or less.   Sending to [employer] for risk assessment or recommendations if patient needs to be seen in office prior to surgical procedure.    Called and got patient scheduled for office visit with [facility] on 05/30/2022. Could not do any sooner since he is currently out of town.

## 2022-05-27 NOTE — Telephone Encounter (Signed)
     Primary Cardiologist: [name], MD  Chart reviewed as part of pre-operative protocol coverage. Given past medical history and time since last visit, based on ACC/AHA guidelines, [name]. would be at acceptable risk for the planned procedure without further cardiovascular testing.   Patient with diagnosis of atrial fibrillation on Eliquis for anticoagulation.     Procedure: fulguration anal wart Date of procedure: 05/31/22     CHA2DS2-VASc Score = 4   This indicates a 4.8% annual risk of stroke. The patient's score is based upon: CHF History: 1 HTN History: 1 Diabetes History: 1 Stroke History: 0 Vascular Disease History: 1 Age Score: 0 Gender Score: 0     CrCl 45 Platelet count 116   Per office protocol, patient can hold Eliquis for 3 days prior to procedure.   Patient will not need bridging with Lovenox (enoxaparin) around procedure.  I will route this recommendation to the requesting party via Epic fax function and remove from pre-op pool.  Please call with questions.  Jossie Ng. Chianne Byrns NP-C     05/27/2022, 11:10 AM [place] [address] Office [phone] Fax [phone]

## 2022-05-28 ENCOUNTER — Other Ambulatory Visit: Payer: Self-pay

## 2022-05-28 MED ORDER — HYDRALAZINE HCL 25 MG PO TABS
25.0000 mg | ORAL_TABLET | Freq: Three times a day (TID) | ORAL | 1 refills | Status: DC
  Filled 2022-05-28: qty 90, 30d supply, fill #0
  Filled 2022-07-09: qty 90, 30d supply, fill #1

## 2022-05-28 NOTE — Progress Notes (Unsigned)
05/29/2022 9:50 AM   [name]. 1960/10/24 [id number]  Referring provider: [name], MD [address] [place],  [place] [address]  Urological history: 1.Undesired fertility -vasectomy 2016  2. Testosterone deficiency -contributing factors of age, diabetes and obesity -testosterone level (03/2022) 470 -Hemoglobin (03/2022) 10.9  3. BPH w/ LU TS -PSA (03/2022) 1.0 -cysto (10/2021) Moderate lateral lobe enlargement with moderate median lobe  prostate -Elevated bladder neck -Moderate trabeculation -TRUS (10/2021) Transrectal Ultrasound performed revealing a 53 gm prostate measuring 4.55 x 4.06 x 5.44 cm (length) -UroLift (12/2021) 6 implants  4. ED -contributing factors of age, BPH, CAD, DM, history of smoking, testosterone deficiency, depression, obesity, HLD, CKD, sleep apnea and HTN -tadalafil 20 mg, daily -discontinued pulmonary hypertension was felt to be WHO Group 2, 3 and was started on Imdur  5. Prostate cancer screening -PSA (03/2021) 1.0  -considering TRT  -baseline PSA 0.68 (2014) at age 67  6. Renal transplant -DDRT 08/2010 - followed at [facility] transplant clinic - last seen 10/2021 -non-contrast CT 10/2021 - Severe bilateral renal atrophy is noted consistent with end-stage renal disease. No hydronephrosis or renal obstruction is noted. Urinary bladder is unremarkable. Renal transplant is noted in the right lower quadrant without definite evidence of hydronephrosis.  7. Scrotal mass -scrotal US (10/2021) - 6.2 x 2.6 x 5.7 mm extratesticular tunical lesion at the anteromedial aspect of the superior pole of the right testicle, appears mostly if not completely solid and could be a complex tunical cyst or granuloma. Other etiologies are possible. Consider 6-12 month follow-up ultrasound to ensure stability. There is no positive color flow.  8. Varicocele -scrotal US (10/2021) On the left, there is a tangle of multiple ectatic veins in the  lateral hemiscrotum measuring up to 4.1 mm with Valsalva consistent with a varicocele.  9. Hydroceles -scrotal US (10/2021) Small scrotal hydroceles, both with floating debris.  10. Tubular ectasia -scrotal US (10/2021) Left-greater-than-right tubular ectasia of the rete testis.  No chief complaint on file.   HPI: Carl Jerison Boch. is a 62 y.o. male who presents today for ICI titration.   I explained how we induce an erection and his questions were answered and he is ready to proceed  PMH: Past Medical History:  Diagnosis Date   Anemia    Anogenital warts in male    Aortic atherosclerosis ([place])    Asthma, persistent controlled 02/25/2013   Attention or concentration deficit 04/26/2021   BPH with obstruction/lower urinary tract symptoms 09/25/2014   CAD (coronary artery disease) 2005   a.) LHC 1996 -> HG stenosis mLAD -> PTCA/PCI with stent x 1 (unk type) -> complicated by CFA pseudoanurysm (required surg);  b.) LHC 08/10/2002  -> 95% LAD, 70% ISR LAD, 80% pOM, 70% mRCA --> CVTS consult; c.) 4v CABG 08/13/2002; c.) 03/2018 MV: Small, fixed inferoapical and apical sep defect. No isc. EF 47% (60-65% by 05/2018 TTE); d.) 02/2021 MV: small Apical inf fixed defect. No isc -> low risk.   Cellulitis and abscess of left leg 07/22/2020   Chronic atrial fibrillation    a.) CHA2DS2VASc = 4 (CHF, HTN, vascular disease history, T2DM);  b.) rate/rhythm maintained without pharmacological intervention; chronically anticoagulated with apixaban   Chronic combined systolic and diastolic CHF (congestive heart failure)    a.  7/18 Echo: EF 45-50%; b. 05/2018 Echo: EF 60-65%, RVSP 74.8; c. 12/2018 Echo: EF 50-55%. Sev dil LA; d. 04/2019 Echo: EF 45-50%; e. 07/2021 Echo: EF 40-45%,  GrIII DD; f. 10/2021 Echo: EF 40-45%, mild conc LVH, septal-lat dyssynchrony (LBBB), mildly reduced RV fxn, mild-mod dil LA, mildly dil RA, mild-mod MS, mod AS.   Chronic venous stasis dermatitis of both lower extremities     Cytomegaloviral disease 2017   Diverticulosis of colon 04/22/2013   ESRD (end stage renal disease) ([place])    a.) dialysis dependent 2004-2012; b.) s/p cadaveric RIGHT renal transplant 09/13/2010   Essential hypertension 12/17/2010   GERD (gastroesophageal reflux disease)    Gout    History of 2019 novel coronavirus disease (COVID-19) 06/30/2019   History of renal transplant 09/13/2010   a.) s/p cadaveric donor transplant 09/13/2010   Hx of bilateral cataract extraction 10/2017   Hyperlipidemia    Hypogonadism in male 09/25/2014   Ischemic cardiomyopathy    Left cephalic vein thrombosis [id number]   Long term current use of anticoagulant    a.) apixaban   Long term current use of immunosuppressive drug    a.) mycophenolate + prednisone + tacrolimus   Major depressive disorder 10/04/2013   Mitral stenosis 11/07/2021   a. TTE 11/07/2021: severe MAC, mild-mod MS (MPG 6 mmHg)   Moderate aortic stenosis    a. 10/2021 Echo: Mod AS. AVA 1.06cm^2 (VTI). Mean grad 60mHg.   Mood disorder ([place]    Obesity hypoventilation syndrome 03/31/2012   OSA treated with BiPAP 09/29/2012   PAH (pulmonary artery hypertension)    a. 04/2019 RHC: RA 19, RV 80/20, PA 78/31 (51), PCWP 25, CO/CI 7.69/3.26. PVR 3.25 WU-->Sev PAH, likely primarily PV HTN.   Pancreatitis    S/P CABG x 4 08/13/2002   a.) LIMA-LAD, LRA-OM, SVG-D1, SVG-dRCA   S/P gastric bypass    Synovitis of finger 06/11/2019   Trigger finger, unspecified little finger 12/23/2018   Type 2 diabetes mellitus with hyperglycemia, with long-term current use of insulin 09/09/2014   Type 2 MI (myocardial infarction) ([place]    Ulcer 05/2016   Left shin   Wears glasses     Surgical History: Past Surgical History:  Procedure Laterality Date   CARDIAC CATHETERIZATION N/A 08/10/2002   CATARACT EXTRACTION W/PHACO Right 10/29/2017   Procedure: CATARACT EXTRACTION PHACO AND INTRAOCULAR LENS PLACEMENT ([place]  RIGHT DIABETIC;  Surgeon: BLeandrew Koyanagi  MD;  [facility]  Service: Ophthalmology;  Laterality: Right;  Diabetic - insulin   CATARACT EXTRACTION W/PHACO Left 11/18/2017   Procedure: CATARACT EXTRACTION PHACO AND INTRAOCULAR LENS PLACEMENT ([place] LEFT IVA/TOPICAL;  Surgeon: BLeandrew Koyanagi MD;  [facility]  Service: Ophthalmology;  Laterality: Left;  DIABETES - insulin sleep apnea   COLONOSCOPY WITH PROPOFOL N/A 04/22/2013   Procedure: COLONOSCOPY WITH PROPOFOL;  Surgeon: DMilus Banister MD;  [facility];  Service: Endoscopy;  Laterality: N/A;   CORONARY ANGIOPLASTY WITH STENT PLACEMENT N/A 1996   CORONARY ARTERY BYPASS GRAFT N/A 08/13/2002   Procedure: 4v CORONARY ARTERY BYPASS GRAFTING; [facility] Surgeon: CAllegra Lai MD   CYSTOSCOPY WITH INSERTION OF UROLIFT N/A 12/25/2021   Procedure: CYSTOSCOPY WITH INSERTION OF UROLIFT;  Surgeon: SAbbie Sons MD;  Location: [facility];  Service: Urology;  Laterality: N/A;   DG ANGIO AV SHUNT*L*     right and left upper arms   FASCIOTOMY  03/03/2012   Procedure: FASCIOTOMY;  Surgeon: GWynonia Sours MD;  [facility]  Service: Orthopedics;  Laterality: Right;  FASCIOTOMY RIGHT SMALL FINGER   FASCIOTOMY Left 08/17/2013   Procedure: FASCIOTOMY LEFT RING;  Surgeon: GWynonia Sours  MD;  Location: [place];  Service: Orthopedics;  Laterality: Left;   INCISION AND DRAINAGE ABSCESS Left 10/15/2015   Procedure: INCISION AND DRAINAGE ABSCESS;  Surgeon: [name], MD;  Location: [facility];  Service: General;  Laterality: Left;   KIDNEY TRANSPLANT  09/13/2010   cadaver--at [facility] HEART CATH N/A 11/15/2016   Procedure: RIGHT HEART CATH;  Surgeon: [name], MD;  Location: [place] [facility];  Service: Cardiovascular;  Laterality: N/A;   RIGHT HEART CATH N/A 05/03/2019   Procedure: RIGHT HEART CATH;  Surgeon: [name], MD;  Location: [place] [facility];  Service:  Cardiovascular;  Laterality: N/A;   RIGHT HEART CATH N/A 04/04/2022   Procedure: RIGHT HEART CATH;  Surgeon: [name], MD;  Location: [place] [facility];  Service: Cardiovascular;  Laterality: N/A;   ROUX-EN-Y GASTRIC BYPASS N/A 2015   TYMPANIC MEMBRANE REPAIR Left 03/2010   VASECTOMY      Home Medications:  Allergies as of 05/29/2022       Reactions   Contrast Media [iodinated Contrast Media] Other (See Comments)   Kidney transplant   Iodine Other (See Comments)   Other reaction(s): Other (See Comments) Kidney transplant   Ibuprofen Other (See Comments)   Due to kidney transplant Due to kidney transplant Due to kidney transplant        Medication List        Accurate as of May 28, 2022  9:50 AM. If you have any questions, ask your nurse or doctor.          albuterol 108 (90 Base) MCG/ACT inhaler Commonly known as: VENTOLIN HFA Inhale 2 puffs into the lungs every 6 (six) hours as needed for wheezing or shortness of breath.   albuterol (2.5 MG/3ML) 0.083% nebulizer solution Commonly known as: PROVENTIL Inhale one vial (3 ml) via nebulizer every 6 hours as needed for wheezing or shortness of breath (Take 3 mLs (2.5 mg total) by nebulization every 6 (six) hours as needed for wheezing or shortness of breath.)   allopurinol 100 MG tablet Commonly known as: ZYLOPRIM Take 100 mg by mouth every morning.   AMBULATORY NON FORMULARY MEDICATION Trimix (30/1/10)-(Pap/Phent/PGE)  Test Dose  107m vial   Qty #3 Refills 0  [phone] [phone]  beclomethasone 80 MCG/ACT inhaler Commonly known as: QVAR Inhale 2 puffs into the lungs daily as needed (for shortness of breath).   buPROPion 150 MG 12 hr tablet Commonly known as: WELLBUTRIN SR TAKE 1 TABLET BY MOUTH TWICE DAILY What changed:  how much to take when to take this   cholecalciferol 25 MCG (1000 UNIT) tablet Commonly known as: VITAMIN D3 Take 1,000 Units by mouth at  bedtime.   colchicine 0.6 MG tablet Take 1 tablet (0.6 mg total) by mouth every other day   Eliquis 5 MG Tabs tablet Generic drug: apixaban TAKE 1 TABLET BY MOUTH TWICE DAILY What changed: how much to take   Farxiga 10 MG Tabs tablet Generic drug: dapagliflozin propanediol Take 1 tablet (10 mg total) by mouth daily before breakfast.   febuxostat 40 MG tablet Commonly known as: ULORIC Take 3 tablets (120 mg total) by mouth once daily What changed:  how much to take how to take this when to take this   Ferrex 150 150 MG capsule Generic drug: iron polysaccharides TAKE 1 CAPSULE BY MOUTH 2 TIMES DAILY. What changed:  how much to take how to  take this when to take this   fluticasone 50 MCG/ACT nasal spray Commonly known as: FLONASE PLACE 2 SPRAYS INTO BOTH NOSTRILS DAILY.   FreeStyle Libre 3 Sensor Misc Use one every 2 weeks.   gabapentin 300 MG capsule Commonly known as: NEURONTIN Take 1 capsule (300 mg total) by mouth 2 (two) times daily.   HumaLOG KwikPen 100 UNIT/ML KwikPen Generic drug: insulin lispro INJECT UP TO 50 UNITS UNDER THE SKIN DAILY AS DIRECTED (INJECT UP TO 50 UNITS UNDER THE SKIN DAILY AS DIRECTED) What changed:  how much to take when to take this additional instructions   hydrocortisone 2.5 % cream Apply topically 3 (three) times daily as needed.   lidocaine 5 % Commonly known as: LIDODERM Place 1 patch onto the skin daily.PLACE 1 PATCH ONTO THE SKIN FOR 12 (TWELVE) HOURS. REMOVE & DISCARD PATCH WITHIN 12 HOURS OR AS DIRECTED BY MD   metolazone 2.5 MG tablet Commonly known as: ZAROXOLYN Take 1 tablet by mouth 2 times weekly (Monday and Friday). What changed:  how much to take how to take this when to take this additional instructions   montelukast 10 MG tablet Commonly known as: SINGULAIR TAKE 1 TABLET BY MOUTH AT BEDTIME. What changed: how much to take   multivitamin with minerals Tabs tablet Take 1 tablet by mouth daily.    mycophenolate 180 MG EC tablet Commonly known as: MYFORTIC Take 2 tablets (360 mg total) by mouth 2 (two) times daily.   omeprazole 20 MG capsule Commonly known as: PRILOSEC TAKE 1 CAPSULE BY MOUTH DAILY. What changed:  how much to take when to take this   potassium chloride 10 MEQ tablet Commonly known as: KLOR-CON Take 6 tablets (60 mEq total) by mouth 2 (two) times daily.   predniSONE 5 MG tablet Commonly known as: DELTASONE Take 1 tablet (5 mg total) by mouth daily.   rosuvastatin 10 MG tablet Commonly known as: CRESTOR Take 1 tablet (10 mg total) by mouth daily. What changed: when to take this   Semglee (yfgn) 100 UNIT/ML Pen Generic drug: insulin glargine-yfgn Inject 35 Units into the skin daily. What changed: when to take this   sulfamethoxazole-trimethoprim 400-80 MG tablet Commonly known as: BACTRIM TAKE 1 TABLET BY MOUTH EVERY MONDAY, [place], FRIDAY. What changed:  how much to take how to take this when to take this additional instructions   tacrolimus 1 MG capsule Commonly known as: PROGRAF Take 2 capsules (2 mg total) by mouth 2 times daily.   tamsulosin 0.4 MG Caps capsule Commonly known as: FLOMAX TAKE 1 CAPSULE BY MOUTH DAILY. What changed: when to take this   Testosterone 10 MG/ACT (2%) Gel 1 pump applied to inner thigh daily- 2 pumps total What changed:  how much to take how to take this when to take this additional instructions   torsemide 20 MG tablet Commonly known as: DEMADEX Take 4 tablets (80 mg total) by mouth 2 (two) times daily.   Unifine Pentips 31G X 5 MM Misc Generic drug: Insulin Pen Needle Use 4 times daily as directed   zinc sulfate 220 (50 Zn) MG capsule Take 220 mg by mouth daily.        Allergies:  Allergies  Allergen Reactions   Contrast Media [Iodinated Contrast Media] Other (See Comments)    Kidney transplant   Iodine Other (See Comments)    Other reaction(s): Other (See Comments) Kidney transplant    Ibuprofen Other (See Comments)    Due to kidney transplant  Due to kidney transplant Due to kidney transplant    Family History: Family History  Problem Relation Age of Onset   Heart disease Father    Kidney failure Father    Kidney disease Father    Diabetes Maternal Grandmother    Breast cancer Maternal Grandmother    Valvular heart disease Mother    Liver cancer Paternal Uncle    Liver cancer Paternal Grandmother    Prostate cancer Neg Hx     Social History:  reports that he quit smoking about 27 years ago. His smoking use included cigars. He has never been exposed to tobacco smoke. He has never used smokeless tobacco. He reports current alcohol use. He reports that he does not use drugs.  ROS: Pertinent ROS in HPI  Physical Exam: There were no vitals taken for this visit.  Constitutional:  Well nourished. Alert and oriented, No acute distress. HEENT: [place] AT, moist mucus membranes.  Trachea midline Cardiovascular: No clubbing, cyanosis, or edema. Respiratory: Normal respiratory effort, no increased work of breathing. GU: No CVA tenderness.  No bladder fullness or masses.  Patient with circumcised phallus.  Urethral meatus is patent.  No penile discharge. No penile lesions or rashes.  Large Peyronie's plaques palpated along almost the entire dorsal side of the penis.  Scrotum without lesions, cysts, rashes and/or edema.   Neurologic: Grossly intact, no focal deficits, moving all 4 extremities. Psychiatric: Normal mood and affect.   Laboratory Data: N/A  Pertinent Imaging: N/A  Procedure  Patient's right corpus cavernosum is identified.  An area near the base of the penis is cleansed with rubbing alcohol.  Careful to avoid the dorsal vein, 0.5 of Caverject 20 mcg Lot # [id number] exp # [id number] is injected at a 90 degree angle into the right corpus cavernosum near the base of the penis.  Patient experienced a firm erection in 15 minutes.    Assessment & Plan:    1.   Peyronie's disease -Patient did not demonstrate any curve with induction of erection  2. ED -*** -Advised patient of the condition of priapism, painful erection lasting for more than four hours, and to contact the office or seek treatment in the ED immediately   No follow-ups on file.  These notes generated with voice recognition software. I apologize for typographical errors.  [place], [place] [address]  [place], [place] [address] [phone]

## 2022-05-29 ENCOUNTER — Encounter: Payer: Self-pay | Admitting: Urology

## 2022-05-29 ENCOUNTER — Encounter: Payer: Self-pay | Admitting: Surgery

## 2022-05-29 ENCOUNTER — Ambulatory Visit (INDEPENDENT_AMBULATORY_CARE_PROVIDER_SITE_OTHER): Payer: Commercial Managed Care - PPO | Admitting: Urology

## 2022-05-29 VITALS — BP 156/67 | HR 74 | Ht 70.0 in | Wt 252.0 lb

## 2022-05-29 DIAGNOSIS — N529 Male erectile dysfunction, unspecified: Secondary | ICD-10-CM | POA: Diagnosis not present

## 2022-05-29 DIAGNOSIS — N486 Induration penis plastica: Secondary | ICD-10-CM | POA: Diagnosis not present

## 2022-05-29 NOTE — Patient Instructions (Signed)
TRIMIX SELF-INJECTION INSTRUCTIONS    DETAILED PROCEDURE  1. GETTING SET UP  A. Proper hygiene is important. Wash your hands and keep the penis clean.  B. Assemble the following:  - Bottle of Trimix  - Alcohol pad  - Syringe  C. Keep the Trimix cold by returning the bottle to the refrigerator, or by placing the bottle in a cup of ice.   2. PREPARE THE SYRINGE  A. Wipe the rubber top of the vial with an alcohol pad.  B. After removing the cap of the needle, pull the plunger back to the desired dosage, filling this volume with air. Use a new needle and syringe each time.  C. Insert the needle through the rubber top and inject the air into the vial.  D. Turn the vial with needle and syringe inserted upside down. Pull back on the syringe plunger in a slow and steady motion until the desired dosage is achieved.  E. Tap the side of the syringe (1cc tuberculin syringe with a 29 gauge needle) to allow any air bubbles to float towards the needle. Avoid having these air bubbles in the syringe when self-injecting by first injecting out the collected bubbles that may form.  F. Remove the needle from the bottle and replace the protective cap on the needle.    3. SELECT AND PREPARE THE SITE FOR INJECTION  A. The proper location for injection is at the 9-11 and 1-3 o'clock positions, between the base and mid-portion of the penis.(see diagram) Avoid the midline because of potential for injury to the urethra (6 o'clock; for urinary passage) and the penile arteries and nerves (near 12 o'clock). Avoid any visible veins or arteries on the surface.  B. Grasp and pull the head of the penis toward the side of your leg with the index finger and thumb (use the left hand, if right handed). While maintaining light tension, select a site for injection.  C. Clean the site with an alcohol pad.   4: INJECT TRIMIX AND APPLY COMPRESSION  A. With a steady and continuous motion, penetrate the skin with the needle at a 90 o  angle. The needle should then be advanced to the hub. Slight resistance is encountered as the needle passes into the proper position within the erectile tissue (corporeal body).  B. Inject the Trimix over approximately 4 seconds. Withdraw the needle from the penis and apply compression to the injection site for approximately 1 minute. Several minutes of compression may be required to avoid bleeding, especially if you are an aspirin user.  C. Replace the cap on the needle and dispose of properly.   If you experience a painful erection that will not go down, take four (30 mg) tablets of pseudoephedrine (Sudafed-not the extended release) and if the erection does not go down in the next hour or increases in pain, contact the office immediately or seek treatment in the ED    

## 2022-05-29 NOTE — Progress Notes (Signed)
Perioperative / Anesthesia Services  Pre-Admission Testing Clinical Review / Preoperative Anesthesia Consult  Date: 05/30/22  Patient Demographics:  Name: [name]. DOB:   [birth year] MRN:   [id number]  Planned Surgical Procedure(s):    Case: [id number] Date/Time: 05/31/22 [id number]   Procedures:      EXAM UNDER ANESTHESIA (Anus)     FULGURATION ANAL WART (Anus)   Anesthesia type: General   Pre-op diagnosis: anal condyloma A63.0   Location: [facility] OR ROOM 04 / [place] [facility]S FOR ANESTHESIA GROUP   Surgeons: Benjamine Sprague, DO     NOTE: Available PAT nursing documentation and vital signs have been reviewed. Clinical nursing staff has updated patient's PMH/PSHx, current medication list, and drug allergies/intolerances to ensure comprehensive history available to assist in medical decision making as it pertains to the aforementioned surgical procedure and anticipated anesthetic course. Extensive review of available clinical information personally performed. [name] PMH and PSHx updated with any diagnoses/procedures that  may have been inadvertently omitted during his intake with the pre-admission testing department's nursing staff.  Clinical Discussion:  [name]. is a 62 y.o. male who is submitted for pre-surgical anesthesia review and clearance prior to him undergoing the above procedure. Patient is a Former Smoker (quit 08/1994). Pertinent PMH includes: CAD (s/p CABG), type 2 MI (demand ischemia from volume overload), ischemic cardiomyopathy, CHF, aortic valve stenosis, mitral valve stenosis, atrial fibrillation, pulmonary hypertension, aortic atherosclerosis, cardiomegaly, cardiac murmur, elevated RIGHT hemidiaphragm, HTN, HLD, T2DM, ESRD/CKD (s/p cadaveric renal transplant), asthma, obesity related hypoventilation syndrome, OSAH (requires nocturnal BiPAP therapy), GERD (on daily PPI), anemia, ED (on TRT + Trimix), anal condyloma, ADD, depression.   Patient is followed by  cardiology (Arida/McLean, MD). He was last seen in the cardiology clinic on 05/09/2022; notes reviewed. At the time of his clinic visit, patient doing well overall from a cardiovascular perspective. Patient complained of chronic vertiginous symptoms. Patient denied any chest pain, shortness of breath, PND, orthopnea, palpitations, significant peripheral edema, weakness, fatigue, or presyncope/syncope. Patient with a past medical history significant for cardiovascular diagnoses. Patient s/p recent admission here at [facility] for CHF exacerbation. He felt improved following admission and subsequent diuresis since discharge. Documented physical exam was grossly benign, providing no evidence of acute exacerbation and/or decompensation of the patient's known cardiovascular conditions. Of note, complete records regarding cardiovascular history not available for review at time of consult.  Information gathered from patient and notes from local cardiology providers.   Patient reportedly underwent diagnostic LEFT heart catheterization in 1996, which revealed high-grade stenosis of the mid LAD.  PTCA/PCI was subsequently performed placing a single stent (unknown type).  Procedure was complicated by the development of an RIGHT common femoral artery pseudoaneurysm that required surgical intervention.   Repeat diagnostic LEFT heart catheterization performed on 08/10/2002 revealing multivessel CAD; 95% LAD, 70% ISR of previously placed LAD stent, 80% proximal OM, and 70% mid RCA.  Due to the complexity of patient's anatomy and severity of disease, patient was referred to CVTS for consideration of revascularization procedure.   Patient underwent four-vessel CABG on 08/13/2002.  LIMA-LAD, LRA-OM, SVG-D1, and SVG-RCA bypass grafts were placed.   Patient dialysis dependent for approximately 8 years (2004-2012).  He underwent a cadaveric donor renal transplant on 09/13/2010.    Most recent  myocardial perfusion imaging study was performed on 03/09/2021.  Patient only able to achieve 49% MPHR due to mild shortness of breath and chest tightness, which improved 1 minute into recovery.  Study showed moderately reduced left ventricular systolic function with an EF of 44%.  There was global hypokinesis noted.  End-diastolic cavity size moderately enlarged.  Perfusion was abnormal revealing a small moderate fixed defect in the apical inferior location. There was no evidence of stress-induced arrhythmia; baseline ECG revealed atrial fibrillation (chronic). There was no reversible ischemia.  Study was low risk.  Most recent TTE was performed on 04/02/2022 revealing a mildly reduced left ventricular systolic function with an EF of 40-45%.  There was global hypokinesis.  Mild concentric LVH noted.  Right ventricular systolic function moderately reduced with a moderately enlarged right ventricular size.  PASP elevated at 48.6 mmHg.  Left atrium severely dilated.  Right atrium mild to moderately dilated.  There was mild mitral valve regurgitation.  All transvalvular gradients were noted to be normal providing no evidence suggestive of valvular stenosis.  Most recent diagnostic RIGHT heart catheterization was performed on 04/04/2022 revealing elevated filling pressures and moderate pulmonary venous hypertension.  Mean RA pressure = 13 mmHg, mean PA pressure = 40 mmHg, mean PCWP = 20 mmHg.  PA saturation = 59%, AO saturation = 92%.  Cardiac output 7.63 L/min.  Cardiac index 3.26 L/min/m.  PVR = 2.6 Wood units.  Patient with an atrial fibrillation diagnosis; CHA2DS2-VASc Score = 4 (CHF, HTN, vascular disease history, T2DM). Rate and rhythm currently being maintained without pharmacological intervention. Patient is chronically anticoagulated with standard dose apixaban.  Blood pressure well controlled at 131/78 mmHg on currently prescribed vasodilator (hydralazine) and diuretic (torsemide + metolazone)  therapies.  Patient is on rosuvastatin for his HLD diagnosis and further ASCVD prevention. T2DM  loosely controlled on currently prescribed regimen; last HgbA1c was 7.7% when checked on 05/15/2022. In the setting of known CHF and concurrent diabetes, patient is also on an SGLT2i (dapagliflozin).  Pulmonary hypertension previously managed with PDE5i, however this intervention has been discontinued in 03/2022. Patient does have an OSAH diagnosis and is compliant with prescribed nocturnal BiPAP therapy. Functional capacity limited by multiple medical comorbidities, however patient still felt to be able to complete at least 4 METS of activity without experiencing any significant angina/anginal equivalent symptoms.  No changes were made to his medication regimen. Patient to follow-up with outpatient cardiology in 1 month or sooner if needed.   Carl Taraj Yoffe. is scheduled for an EXAM UNDER ANESTHESIA; ANAL WART FULGURATION on 05/31/2022 with Dr. Benjamine Sprague, DO. Given patient's past medical history significant for cardiovascular diagnoses, presurgical cardiac clearance was sought by the PAT team. Per cardiology, "based ACC/AHA guidelines, the patient's past medical history, and the amount of time since his last clinic visit, this patient would be at an overall ACCEPTABLE risk for the planned procedure without further cardiovascular testing or intervention at this time".    Again, this patient is on daily anticoagulation therapy.  He has been instructed on recommendations from his cardiologist for holding his apixaban for 3 days prior to his procedure with plans to restart as soon as postoperatively respectively minimized by primary attending surgeon.  The patient is aware that his last dose of apixaban should be on 05/27/2022.  Per cardiology, patient will not require enoxaparin bridging for this procedure.  Patient reports previous perioperative complications with anesthesia in the past. In review of the  available records, it is noted that patient underwent a general anesthetic course here at [facility] ([facility]) in 12/2021 without documented complications.      05/30/2022   10:25 AM  05/29/2022    8:14 AM 05/23/2022    4:00 PM  Vitals with BMI  Height  '5\' 10"'$  '5\' 10"'$   Weight 260 lbs 6 oz 252 lbs 216 lbs 15 oz  BMI 37.36 [id number]  Systolic 0000000 [id number]   Diastolic 80 67   Pulse 74 74     Providers/Specialists:   NOTE: Primary physician provider listed below. Patient may have been seen by APP or partner within same practice.   PROVIDER ROLE / SPECIALTY LAST Shirline Frees, DO General Surgery (Surgeon) 05/07/2022  [name], MD Primary Care Provider 05/01/2022  [name], MD Cardiology 04/19/2021  Loralie Champagne, MD Advanced Heart Failure 05/09/2021  Baird Lyons, MD Pulmonary Medicine 12/06/2021   Allergies:  Contrast media [iodinated contrast media], Iodine, and Ibuprofen  Current Home Medications:   No current facility-administered medications for this encounter.    albuterol (PROVENTIL) (2.5 MG/3ML) 0.083% nebulizer solution   albuterol (VENTOLIN HFA) 108 (90 Base) MCG/ACT inhaler   allopurinol (ZYLOPRIM) 100 MG tablet   AMBULATORY NON FORMULARY MEDICATION   apixaban (ELIQUIS) 5 MG TABS tablet   beclomethasone (QVAR) 80 MCG/ACT inhaler   buPROPion (WELLBUTRIN SR) 150 MG 12 hr tablet   cholecalciferol (VITAMIN D3) 25 MCG (1000 UNIT) tablet   colchicine 0.6 MG tablet   Continuous Blood Gluc Sensor (FREESTYLE LIBRE 3 SENSOR) MISC   dapagliflozin propanediol (FARXIGA) 10 MG TABS tablet   febuxostat (ULORIC) 40 MG tablet   fluticasone (FLONASE) 50 MCG/ACT nasal spray   gabapentin (NEURONTIN) 300 MG capsule   hydrALAZINE (APRESOLINE) 25 MG tablet   hydrocortisone 2.5 % cream   insulin glargine-yfgn (SEMGLEE) 100 UNIT/ML Pen   insulin lispro (HUMALOG KWIKPEN) 100 UNIT/ML KwikPen   Insulin Pen Needle (UNIFINE PENTIPS) 31G X 5 MM  MISC   iron polysaccharides (FERREX 150) 150 MG capsule   lidocaine (LIDODERM) 5 %   metolazone (ZAROXOLYN) 2.5 MG tablet   montelukast (SINGULAIR) 10 MG tablet   Multiple Vitamin (MULTIVITAMIN WITH MINERALS) TABS tablet   mycophenolate (MYFORTIC) 180 MG EC tablet   omeprazole (PRILOSEC) 20 MG capsule   potassium chloride (KLOR-CON) 10 MEQ tablet   predniSONE (DELTASONE) 5 MG tablet   rosuvastatin (CRESTOR) 10 MG tablet   sulfamethoxazole-trimethoprim (BACTRIM) 400-80 MG tablet   tacrolimus (PROGRAF) 1 MG capsule   tamsulosin (FLOMAX) 0.4 MG CAPS capsule   Testosterone 10 MG/ACT (2%) GEL   torsemide (DEMADEX) 20 MG tablet   zinc sulfate 220 (50 Zn) MG capsule   History:   Past Medical History:  Diagnosis Date   Anal condyloma    Anemia    Aortic atherosclerosis (HCC)    Aortic stenosis    a.) TTE 10/2021: Mod AS. AVA 1.06cm^2 (VTI). Mean grad 32mHg; b.) TTE 04/02/2022: no AV stenosis   Asthma, persistent controlled 02/25/2013   Attention or concentration deficit 04/26/2021   BPH with obstruction/lower urinary tract symptoms 09/25/2014   CAD (coronary artery disease) 2005   a.) LHC 1996 -> HG stenosis mLAD -> PTCA/PCI with stent x 1 (unk type) -> complicated by CFA pseudoanurysm (required surg);  b.) LHC 08/10/2002  -> 95% LAD, 70% ISR LAD, 80% pOM, 70% mRCA --> CVTS consult; c.) 4v CABG 08/13/2002; c.) 03/2018 MV: Small, fixed inferoapical and apical sep defect. No isc. EF 47% (60-65% by 05/2018 TTE); d.) 02/2021 MV: small Apical inf fixed defect. No isc -> low risk.   Cardiomegaly    Cellulitis and abscess of left leg 07/22/2020  Chronic atrial fibrillation    a.) CHA2DS2VASc = 4 (CHF, HTN, vascular disease history, T2DM);  b.) rate/rhythm maintained without pharmacological intervention; chronically anticoagulated with apixaban   Chronic combined systolic and diastolic CHF (congestive heart failure)    a.) TTE 7/18 : EF 45-50%; b.) TTE 05/2018 : EF 60-65%, RVSP 74.8; c.) TTE  12/2018: EF 50-55%. Sev dil LA; d.) TTE 04/2019: EF 45-50%; e.) TTE 07/2021: EF 40-45%, G3DD; f.) TTE 10/2021: EF 40-45%, mild conc LVH, septal-lat dyssynchrony (LBBB), mildly red RVSF, mild-mod dil LA, mildly dil RA, mild-mod MS, mod AS; g.) TTE 04/02/2022: EF 40-45%, glob HK, LVH, red RVSF, RVE, sev LAE, mild-mod RAE, mild MR   Chronic venous stasis dermatitis of both lower extremities    Cytomegaloviral disease 2017   Diverticulosis of colon 04/22/2013   Elevated RIGHT hemidiaphragm    Erectile dysfunction    a.) on topical TRT + Trimix (alprostadil/papaverine/phentolamine) injections   ESRD (end stage renal disease) ([place])    a.) dialysis dependent 2004-2012; b.) s/p cadaveric RIGHT renal transplant 09/13/2010   Essential hypertension 12/17/2010   GERD (gastroesophageal reflux disease)    Gout    History of 2019 novel coronavirus disease (COVID-19) 06/30/2019   History of bilateral cataract extraction 10/2017   History of renal transplant 09/13/2010   a.) s/p cadaveric donor transplant 09/13/2010   Hx of bilateral cataract extraction 10/2017   Hyperlipidemia    Hypogonadism in male 09/25/2014   Ischemic cardiomyopathy    Left cephalic vein thrombosis [id number]   Long term current use of anticoagulant    a.) apixaban   Long term current use of immunosuppressive drug    a.) mycophenolate + prednisone + tacrolimus   Major depressive disorder 10/04/2013   Mitral stenosis 11/07/2021   a.) TTE 11/07/2021: severe MAC, mild-mod MS (MPG 6 mmHg); b.) TTE 04/02/2022: no MV stenosis   Murmur    Obesity hypoventilation syndrome 03/31/2012   OSA treated with BiPAP 09/29/2012   PAH (pulmonary artery hypertension)    a. 04/2019 RHC: RA 19, RV 80/20, PA 78/31 (51), PCWP 25, CO/CI 7.69/3.26. PVR 3.25 --> Sev PAH, likely primarily PV HTN; b.) RHC 04/04/2022: mRA 13, mPA 40, mPCWP 20, PA sat 59, AO sat 92, CO 7.63, CI 3.26, PVR 2.6 --> mod portal venous HTN   Pancreatitis    S/P CABG x 4 08/13/2002   a.)  LIMA-LAD, LRA-OM, SVG-D1, SVG-dRCA   S/P gastric bypass    Synovitis of finger 06/11/2019   Trigger finger, unspecified little finger 12/23/2018   Type 2 diabetes mellitus with hyperglycemia, with long-term current use of insulin 09/09/2014   a.) has FreeStyle Libre CGM   Type 2 MI (myocardial infarction) ([place])    Ulcer 05/2016   Left shin   Wears glasses    Past Surgical History:  Procedure Laterality Date   CARDIAC CATHETERIZATION N/A 08/10/2002   CATARACT EXTRACTION W/PHACO Right 10/29/2017   Procedure: CATARACT EXTRACTION PHACO AND INTRAOCULAR LENS PLACEMENT ([place])  RIGHT DIABETIC;  Surgeon: [name], MD;  Location: [place];  Service: Ophthalmology;  Laterality: Right   CATARACT EXTRACTION W/PHACO Left 11/18/2017   Procedure: CATARACT EXTRACTION PHACO AND INTRAOCULAR LENS PLACEMENT ([place]) LEFT IVA/TOPICAL;  Surgeon: [name], MD;  Location: [place];  Service: Ophthalmology;  Laterality: Left   COLONOSCOPY WITH PROPOFOL N/A 04/22/2013   Procedure: COLONOSCOPY WITH PROPOFOL;  Surgeon: [name], MD;  Location: [facility];  Service: Endoscopy;  Laterality: N/A;   CORONARY ANGIOPLASTY WITH  STENT PLACEMENT N/A 1996   CORONARY ARTERY BYPASS GRAFT N/A 08/13/2002   Procedure: 4v CORONARY ARTERY BYPASS GRAFTING; Location: [name]; Surgeon: [name], MD   CYSTOSCOPY WITH INSERTION OF UROLIFT N/A 12/25/2021   Procedure: CYSTOSCOPY WITH INSERTION OF UROLIFT;  Surgeon: [name], MD;  Location: [facility];  Service: Urology;  Laterality: N/A;   DG ANGIO AV SHUNT*L*     right and left upper arms   FASCIOTOMY  03/03/2012   Procedure: FASCIOTOMY;  Surgeon: [name], MD;  Location: [place];  Service: Orthopedics;  Laterality: Right;  FASCIOTOMY RIGHT SMALL FINGER   FASCIOTOMY Left 08/17/2013   Procedure: FASCIOTOMY LEFT RING;  Surgeon: [name], MD;  Location: [place];  Service:  Orthopedics;  Laterality: Left;   INCISION AND DRAINAGE ABSCESS Left 10/15/2015   Procedure: INCISION AND DRAINAGE ABSCESS;  Surgeon: [name], MD;  Location: [facility];  Service: General;  Laterality: Left;   KIDNEY TRANSPLANT  09/13/2010   cadaver--at [facility] HEART CATH N/A 11/15/2016   Procedure: RIGHT HEART CATH;  Surgeon: [name], MD;  Location: [place] [facility];  Service: Cardiovascular;  Laterality: N/A;   RIGHT HEART CATH N/A 05/03/2019   Procedure: RIGHT HEART CATH;  Surgeon: [name], MD;  Location: [place] [facility];  Service: Cardiovascular;  Laterality: N/A;   RIGHT HEART CATH N/A 04/04/2022   Procedure: RIGHT HEART CATH;  Surgeon: [name], MD;  Location: [place] [facility] LAB;  Service: Cardiovascular;  Laterality: N/A;   ROUX-EN-Y GASTRIC BYPASS N/A 2015   TYMPANIC MEMBRANE REPAIR Left 03/2010   VASECTOMY     Family History  Problem Relation Age of Onset   Heart disease Father    Kidney failure Father    Kidney disease Father    Diabetes Maternal Grandmother    Breast cancer Maternal Grandmother    Valvular heart disease Mother    Liver cancer Paternal Uncle    Liver cancer Paternal Grandmother    Prostate cancer Neg Hx    Social History   Tobacco Use   Smoking status: Former    Types: Cigars    Quit date: 09/02/1994    Years since quitting: 27.7    Passive exposure: Never   Smokeless tobacco: Never   Tobacco comments:    Occasional, rare cigars  Vaping Use   Vaping Use: Never used  Substance Use Topics   Alcohol use: Yes    Alcohol/week: 0.0 - 1.0 standard drinks of alcohol    Comment: occasional - 12-pack lasts a month   Drug use: No    Pertinent Clinical Results:  LABS:   Lab Results  Component Value Date   WBC 4.6 04/01/2022   HGB 10.9 (L) 04/04/2022   HGB 10.5 (L) 04/04/2022   HCT 32.0 (L) 04/04/2022   HCT 31.0 (L) 04/04/2022   MCV 73.7 (L) 04/01/2022   PLT 116 (L) 04/01/2022   Lab Results   Component Value Date   NA 142 05/30/2022   K 4.2 05/30/2022   CO2 31 05/30/2022   GLUCOSE 132 (H) 05/30/2022   BUN 59 (H) 05/30/2022   CREATININE 1.63 (H) 05/30/2022   CALCIUM 9.3 05/30/2022   GFRNONAA 47 (L) 05/30/2022    ECG: Date: 04/17/2022 Time ECG obtained: 1110 AM Rate: 76 bpm Rhythm: atrial fibrillation; nonspecific IVCD Intervals: QRS 150 ms. QTc 506 ms. ST segment and T wave changes: Inferior TWIs.  Nonspecific T wave abnormality improved in lateral leads.  Comparison: Similar to previous tracing obtained on 04/01/2022   IMAGING / PROCEDURES: RIGHT HEART CATHETERIZATION performed on 04/04/2022 RA mean 13 RV 64/14 PA 62/24, mean 40 PCWP mean 20 Oxygen saturations: PA 59, AO 92% Cardiac Output (Fick) 7.63  Cardiac Index (Fick) 3.26 PVR 2.6 WU PAPi 2.9   DIAGNOSTIC RADIOGRAPHS OF CHEST 2 VIEWS performed on 04/02/2022 Unchanged cardiomegaly.  Status post median sternotomy and CABG. Calcified mediastinal and hilar lymph nodes, likely sequela of prior granulomatous infection.  Elevation of the right hemidiaphragm.  Mild vascular congestion with no evidence overt pulmonary edema or consolidation.  TRANSTHORACIC ECHOCARDIOGRAM performed on 04/02/2022 Left ventricular ejection fraction, by estimation, is 40 to 45%. The left ventricle has mildly decreased function. The left ventricle demonstrates global hypokinesis. There is mild concentric left ventricular hypertrophy. Left ventricular diastolic function could not be evaluated.  Right ventricular systolic function is moderately reduced. The right ventricular size is moderately enlarged. There is moderately elevated pulmonary artery systolic pressure. The estimated right ventricular systolic pressure is 123456 mmHg.  Left atrial size was severely dilated.  Right atrial size was mild to moderately dilated.  The mitral valve is degenerative. Mild mitral valve regurgitation. No evidence of mitral stenosis.  The aortic valve  is tricuspid. Aortic valve regurgitation is not visualized. No aortic stenosis is present.  The inferior vena cava is dilated in size with <50% respiratory variability, suggesting right atrial pressure of 15 mmHg.   MYOCARDIAL PERFUSION IMAGING STUDY (LEXISCAN) performed on 03/09/2021 Mildly reduced left ventricular systolic function with an EF of 44% Baseline ECG revealed atrial fibrillation No ST or T wave changes noted during stress LV perfusion is abnormal, however there was no evidence of significant reversible ischemia. There was evidence of infarction. Small defect with moderate reduction of uptake present in the apical inferior location that is fixed.  Left ventricular function is abnormal.  Global function is mildly reduced.  End-diastolic cavity size is moderately enlarged No significant changes from prior study Study determined to be low risk  Impression and Plan:  [name]. has been referred for pre-anesthesia review and clearance prior to him undergoing the planned anesthetic and procedural courses. Available labs, pertinent testing, and imaging results were personally reviewed by me in preparation for upcoming operative/procedural course. [facility] medical record has been updated following extensive record review and patient interview with PAT staff.   This patient has been appropriately cleared by cardiology with an overall ACCEPTABLE risk of significant perioperative cardiovascular complications. Based on clinical review performed today (05/30/22), barring any significant acute changes in the patient's overall condition, it is anticipated that he will be able to proceed with the planned surgical intervention. Any acute changes in clinical condition may necessitate his procedure being postponed and/or cancelled. Patient will meet with anesthesia team (MD and/or CRNA) on the day of his procedure for preoperative evaluation/assessment. Questions regarding anesthetic course  will be fielded at that time.   Pre-surgical instructions were reviewed with the patient during his PAT appointment, and questions were fielded to satisfaction by PAT clinical staff. He has been instructed on which medications that he will need to hold prior to surgery, as well as the ones that have been deemed safe/appropriate to take of the day of his procedure. As part of the general education provided by PAT, patient made aware both verbally and in writing, that he would need to abstain from the use of any illegal substances during  his perioperative course.  He was advised that failure to follow the provided instructions could necessitate case cancellation or result serious perioperative complications up to and including death. Patient encouraged to contact PAT and/or his surgeon's office to discuss any questions or concerns that may arise prior to surgery; verbalized understanding.   Honor Loh, MSN, APRN, FNP-C, CEN [facility]  Peri-operative Services Nurse Practitioner Phone: [phone] Fax: [phone] 05/30/22 1:15 PM  NOTE: This note has been prepared using Dragon dictation software. Despite my best ability to proofread, there is always the potential that unintentional transcriptional errors may still occur from this process.

## 2022-05-30 ENCOUNTER — Encounter: Payer: Self-pay | Admitting: Surgery

## 2022-05-30 ENCOUNTER — Ambulatory Visit: Payer: Commercial Managed Care - PPO | Admitting: Nurse Practitioner

## 2022-05-30 ENCOUNTER — Other Ambulatory Visit: Payer: Self-pay | Admitting: Urology

## 2022-05-30 ENCOUNTER — Other Ambulatory Visit: Payer: Self-pay

## 2022-05-30 ENCOUNTER — Encounter: Payer: Self-pay | Admitting: Cardiology

## 2022-05-30 ENCOUNTER — Other Ambulatory Visit: Payer: Self-pay | Admitting: Primary Care

## 2022-05-30 ENCOUNTER — Other Ambulatory Visit
Admission: RE | Admit: 2022-05-30 | Discharge: 2022-05-30 | Disposition: A | Discharge status: Home / Self Care | Payer: Commercial Managed Care - PPO | Source: Ambulatory Visit | Attending: Cardiology | Admitting: Cardiology

## 2022-05-30 ENCOUNTER — Ambulatory Visit (HOSPITAL_BASED_OUTPATIENT_CLINIC_OR_DEPARTMENT_OTHER): Payer: Commercial Managed Care - PPO | Admitting: Cardiology

## 2022-05-30 VITALS — BP 160/80 | HR 74 | Wt 260.4 lb

## 2022-05-30 DIAGNOSIS — Z794 Long term (current) use of insulin: Secondary | ICD-10-CM | POA: Insufficient documentation

## 2022-05-30 DIAGNOSIS — I5032 Chronic diastolic (congestive) heart failure: Secondary | ICD-10-CM | POA: Insufficient documentation

## 2022-05-30 DIAGNOSIS — I272 Pulmonary hypertension, unspecified: Secondary | ICD-10-CM | POA: Insufficient documentation

## 2022-05-30 DIAGNOSIS — I5022 Chronic systolic (congestive) heart failure: Secondary | ICD-10-CM

## 2022-05-30 DIAGNOSIS — G4733 Obstructive sleep apnea (adult) (pediatric): Secondary | ICD-10-CM | POA: Insufficient documentation

## 2022-05-30 DIAGNOSIS — E1122 Type 2 diabetes mellitus with diabetic chronic kidney disease: Secondary | ICD-10-CM | POA: Insufficient documentation

## 2022-05-30 DIAGNOSIS — D649 Anemia, unspecified: Secondary | ICD-10-CM | POA: Insufficient documentation

## 2022-05-30 DIAGNOSIS — I482 Chronic atrial fibrillation, unspecified: Secondary | ICD-10-CM | POA: Diagnosis not present

## 2022-05-30 DIAGNOSIS — I509 Heart failure, unspecified: Secondary | ICD-10-CM | POA: Diagnosis not present

## 2022-05-30 DIAGNOSIS — M109 Gout, unspecified: Secondary | ICD-10-CM | POA: Insufficient documentation

## 2022-05-30 DIAGNOSIS — I252 Old myocardial infarction: Secondary | ICD-10-CM | POA: Insufficient documentation

## 2022-05-30 DIAGNOSIS — I4510 Unspecified right bundle-branch block: Secondary | ICD-10-CM | POA: Insufficient documentation

## 2022-05-30 DIAGNOSIS — Z79624 Long term (current) use of inhibitors of nucleotide synthesis: Secondary | ICD-10-CM | POA: Insufficient documentation

## 2022-05-30 DIAGNOSIS — Z951 Presence of aortocoronary bypass graft: Secondary | ICD-10-CM | POA: Insufficient documentation

## 2022-05-30 DIAGNOSIS — N1832 Chronic kidney disease, stage 3b: Secondary | ICD-10-CM | POA: Insufficient documentation

## 2022-05-30 DIAGNOSIS — Z7901 Long term (current) use of anticoagulants: Secondary | ICD-10-CM | POA: Insufficient documentation

## 2022-05-30 DIAGNOSIS — Z7952 Long term (current) use of systemic steroids: Secondary | ICD-10-CM | POA: Insufficient documentation

## 2022-05-30 DIAGNOSIS — I770 Arteriovenous fistula, acquired: Secondary | ICD-10-CM | POA: Insufficient documentation

## 2022-05-30 DIAGNOSIS — I2510 Atherosclerotic heart disease of native coronary artery without angina pectoris: Secondary | ICD-10-CM | POA: Insufficient documentation

## 2022-05-30 DIAGNOSIS — I255 Ischemic cardiomyopathy: Secondary | ICD-10-CM | POA: Insufficient documentation

## 2022-05-30 DIAGNOSIS — Z79621 Long term (current) use of calcineurin inhibitor: Secondary | ICD-10-CM | POA: Insufficient documentation

## 2022-05-30 DIAGNOSIS — I130 Hypertensive heart and chronic kidney disease with heart failure and stage 1 through stage 4 chronic kidney disease, or unspecified chronic kidney disease: Secondary | ICD-10-CM | POA: Insufficient documentation

## 2022-05-30 DIAGNOSIS — Z87891 Personal history of nicotine dependence: Secondary | ICD-10-CM | POA: Insufficient documentation

## 2022-05-30 DIAGNOSIS — Z940 Kidney transplant status: Secondary | ICD-10-CM | POA: Insufficient documentation

## 2022-05-30 LAB — BASIC METABOLIC PANEL
Anion gap: 10 (ref 5–15)
BUN: 59 mg/dL — ABNORMAL HIGH (ref 8–23)
CO2: 31 mmol/L (ref 22–32)
Calcium: 9.3 mg/dL (ref 8.9–10.3)
Chloride: 101 mmol/L (ref 98–111)
Creatinine, Ser: 1.63 mg/dL — ABNORMAL HIGH (ref 0.61–1.24)
GFR, Estimated: 47 mL/min — ABNORMAL LOW (ref >60)
Glucose, Bld: 132 mg/dL — ABNORMAL HIGH (ref 70–99)
Potassium: 4.2 mmol/L (ref 3.5–5.1)
Sodium: 142 mmol/L (ref 135–145)

## 2022-05-30 LAB — BRAIN NATRIURETIC PEPTIDE: B Natriuretic Peptide: 430.9 pg/mL — ABNORMAL HIGH (ref 0.0–100.0)

## 2022-05-30 MED ORDER — TESTOSTERONE 10 MG/ACT (2%) TD GEL
1.0000 | Freq: Every day | TRANSDERMAL | 0 refills | Status: DC
  Filled 2022-05-30: qty 60, 30d supply, fill #0

## 2022-05-30 MED ORDER — COLCHICINE 0.6 MG PO TABS
0.6000 mg | ORAL_TABLET | ORAL | 2 refills | Status: DC
  Filled 2022-05-30: qty 30, 60d supply, fill #0

## 2022-05-30 MED ORDER — METOLAZONE 2.5 MG PO TABS
2.5000 mg | ORAL_TABLET | ORAL | 3 refills | Status: DC
  Filled 2022-05-30: qty 15, fill #0
  Filled 2022-08-13: qty 15, 35d supply, fill #0
  Filled 2022-09-13: qty 15, 35d supply, fill #1

## 2022-05-30 MED ORDER — ALBUTEROL SULFATE HFA 108 (90 BASE) MCG/ACT IN AERS
2.0000 | INHALATION_SPRAY | Freq: Four times a day (QID) | RESPIRATORY_TRACT | 5 refills | Status: DC | PRN
  Filled 2022-05-30: qty 6.7, 25d supply, fill #0

## 2022-05-30 MED ORDER — TORSEMIDE 20 MG PO TABS
100.0000 mg | ORAL_TABLET | Freq: Two times a day (BID) | ORAL | 3 refills | Status: DC
  Filled 2022-05-30: qty 300, 30d supply, fill #0

## 2022-05-30 MED FILL — Fluticasone Propionate Nasal Susp 50 MCG/ACT: NASAL | 30 days supply | Qty: 16 | Fill #3 | Status: AC

## 2022-05-30 MED FILL — Dapagliflozin Propanediol Tab 10 MG (Base Equivalent): ORAL | 30 days supply | Qty: 30 | Fill #2 | Status: AC

## 2022-05-30 NOTE — Progress Notes (Signed)
Advanced Heart Failure Clinic Note   Date:  05/30/2022   ID:  Prem Jacobucci., DOB 07-18-1960, MRN [id number]   Provider location: [address], [place] Type of Visit:  Established patient  PCP:  [name], MD  Cardiologist:  [name] Nephrology: Dr. Ronnald Ramp  HF Cardiologist: [name]  HPI: [name]. is a 62 y.o. male  with a history of CAD s/p CABG 2005, chronic atrial fibrillation on Eliquis, ESRD s/p renal transplant 2012, anc chronic diastolic CHF.  His renal function has been fairly stable, most recent creatinine 1.43.  He has been struggling with volume overload/CHF.  He has a history of ischemic cardiomyopathy with EF as low as 35-40% in the past but echo in 3/20 showed EF 60-65%.  There is evidence for RV failure with pulmonary hypertension by echo.    Admitted 11/05/18 low grade fever and shortness of breath. HS Trop negative. Covid 19 negative. Blood CX negative. Placed on antibiotics and discharged to home the next day.   Admitted [id number] with A/C Diastolic HF. Diuresed with IV lasix and transitioned to torsemide 80/40 mg . Discharge weight 252 pounds.   Admitted 06/2019 with left arm pain. Left upper extremity venous Doppler revealed occluded cephalic vein at the AV fistula outflow tract in the antecubital fossa with no other thrombus identified. Vascular evaluated. He continued on Eliquis.  No plan for intervention. Also treated for acute gout flare. Discharged on prednisone taper.     Given IV lasix on 12/21 by HF Paramedicine.   Admitted to [facility] 2/22 with acute gout flare vs cellulitis. Nephrology was consulted to evaluate transplant regimen and follow along. He was treated with antibiotics and prednisone. Discharged on decreased torsemide dose of 40 mg daily. Weight 259 lbs.  Admitted 4/22 for left leg cellulitis after sustaining a dog scratch, received IV antibiotics. No changes to his diuretic therapy. Discharge weight 266  lbs.  Follow up with [name], 1/23, he had CP and underwent Lexiscan myoview, which overall was a low risk study with evidence of prior infarct in the infero-apical area with no significant change from before. Losartan added to GDMT regimen.  Follow up 5/23, volume overloaded, REDs 40%. Advised taking additional metolazone (3 doses that week), however labs showed worsening renal function and advised to keep at 2 doses.  Admitted in 5/23 with acute on chronic CHF and AKI.  Gareth Eagle and losartan stopped. Diuresed with IV lasix. Echo showed EF 40-45%, RV mildly reduced, GIIIDD (restrictive), IVC dilated. Nephrology consulted for diuretic dosing. Discharged home, weight 266 lbs.  He had chest pain in 8/23 and was admitted at [facility], minimal HS-TnI elevation with no trend, thought to be demand ischemia from volume overload.    Echo 8/23 showing EF 40-45%, diffuse hypokinesis with dyssynchrony, mildly dilated RV with mildly decreased systolic function, IVC dilated, moderate AS with mean gradient 15, mild-moderate mitral stenosis with mean gradient 6.   Follow up 03/27/22, volume overloaded with 25 lbs weight gain. Instructed to use Furoscix x 3 days, then resume home dose of torsemide + metolazone. Clinic weight 277 lbs.  Admitted from [facility] 04/01/22 with a/c CHF. Echo showed EF 40-45%, RV moderately reduced and RVSP 49 mmHg. RHC showed elevated filling pressures, moderate primarily pulmonary venous hypertension and preserved CO. Tadalafil stopped with primarily [place] group 2 and 3. He was diuresed with IV lasix and metolazone, and GDMT added back. Overall down 19 lbs. He was  discharged home, weight 255 lbs.  Today he returns for HF follow up. At last appointment, diuretics were cut back due to elevated BUN/creatinine and hydralazine/Imdur were stopped due to dizziness that he attributed to Imdur.  BP is high today, but he is no longer having "dizzy spells."  Weight is up 8 lbs.  Breathing is stable, not  short of breath walking on flat ground.  Dyspnea with stairs/inclines.  He was able to go to [place] last weekend to visit family.    REDS clip 43%  ECG (personally reviewed): atrial fibrillation, RBBB, PVCs, LAFB  Labs (7/19): LDL 49 Labs (2/20): K 3.7, creatinine 1.43 Labs (4/20): K 4.3, creatinine 1.24 Labs (11/06/18): K 3.7 creatinine 1.23  Labs (05/07/19): K 3.8 Creatinine 1.5  Labs (07/18/19): K 4.3 Creatinine 1.42  Labs (5/21): K 3.3, creatinine 1.21 Labs (11/21): K 3.6, creatinine 1.4 Labs (12/21): K 3.3, creatinine 2.4, hgb 12.8 Labs (04/01/19): K 3.3 Creatinine 2.2  Labs (8/22): K 4.0, creatinine 1.6, HDL 35, LDL 36, a1c 7.5 Labs (2/23): LDL 31, HDL 36 Labs (4/23): K 3.3, creatinine 1.68 Labs (5/23): K 4.1, creatinine 2.24 Labs (8/23): K 3.5, creatinine 2.36 Labs (12/23): K 3.6, creatinine 1.99, hgb 9.3 Labs (1/24): K 4.6, creatinine 2.44 Labs (2/24): K 3.4, creatinine 2.75, LDL 25  PMH: 1. CAD: s/p CABG x 4 in [place] in 2005.  - Cardiolite (1/20): EF 47%, small fixed apical septal defect, no ischemia.  Low risk study.  - Cardiolite (12/22): small apical inferior defect, no changes from prior, no ischemia. Low risk study. 2. Asthma 3. Gout 4. Atrial fibrillation: Chronic.  5. ESRD s/p renal transplantation at [facility] in 2012.  6. Anemia of chronic disease 7. Type II diabetes 8. HTN 9. Hyperlipidemia 10. OSA: On bipap.  11. Ischemic cardiomyopathy: EF 35-40% in past.  - Cardiolite (1/20) with EF 47%.  - Echo (1/20): EF 50-55%, mild MR, RV moderately dilated with moderately decreased systolic function, PASP 73 mmHg, moderate TR.   - Echo (3/20): EF 60-65%, PASP 75 mmHg, mildly dilated RV with mildly decreased systolic function.  - Echo (2/21): EF 45-50%, global HK, moderately dilated RV with moderately decreased systolic function, D-shaped interventricular septum, dilated IVC.  - RHC (2/21): mean RA 19, PA 78/31 mean 51, mean PCWP 26, CI 3.26, PVR 3.25 - Echo  (5/23): EF 40-45%, RV mildly reduced, GIIIDD (restrictive) - Echo (8/23): EF 40-45%, diffuse hypokinesis with dyssynchrony, mildly dilated RV with mildly decreased systolic function, IVC dilated, moderate AS with mean gradient 15, mild-moderate mitral stenosis with mean gradient 6.  - Echo (1/24): Echo showed EF 40-45%, RV moderately reduced and RVSP 49 mmHg, mild MR, no AS, dilated IVC.  - RHC (1/24): RA mean 13, PA 62/24 (mean 40), PCWP mean 20, CO/CI (Fick) 7.63/3.26, PVR 2.6 WU, PAPi 2.9 12. Obesity: s/p gastric bypass in 2015.  13. Bradycardia with beta blocker.  14. Aortic stenosis: Moderate on 8/23 echo.  15. Mitral stenosis: Mild to moderate on 8/23 echo.  16. Rheumatoid arthritis  Past Surgical History:  Procedure Laterality Date   CARDIAC CATHETERIZATION N/A 08/10/2002   CATARACT EXTRACTION W/PHACO Right 10/29/2017   Procedure: CATARACT EXTRACTION PHACO AND INTRAOCULAR LENS PLACEMENT ([place])  RIGHT DIABETIC;  Surgeon: [name], MD;  Location: [place];  Service: Ophthalmology;  Laterality: Right   CATARACT EXTRACTION W/PHACO Left 11/18/2017   Procedure: CATARACT EXTRACTION PHACO AND INTRAOCULAR LENS PLACEMENT (IOC) LEFT IVA/TOPICAL;  Surgeon: [name], MD;  Location: [facility];  Service: Ophthalmology;  Laterality: Left   COLONOSCOPY WITH PROPOFOL N/A 04/22/2013   Procedure: COLONOSCOPY WITH PROPOFOL;  Surgeon: [name], MD;  Location: [facility];  Service: Endoscopy;  Laterality: N/A;   CORONARY ANGIOPLASTY WITH STENT PLACEMENT N/A 1996   CORONARY ARTERY BYPASS GRAFT N/A 08/13/2002   Procedure: 4v CORONARY ARTERY BYPASS GRAFTING; Location: [name]; Surgeon: [name], MD   CYSTOSCOPY WITH INSERTION OF UROLIFT N/A 12/25/2021   Procedure: CYSTOSCOPY WITH INSERTION OF UROLIFT;  Surgeon: [name], MD;  Location: [facility];  Service: Urology;  Laterality: N/A;   DG ANGIO AV SHUNT*L*     right and left upper arms    FASCIOTOMY  03/03/2012   Procedure: FASCIOTOMY;  Surgeon: [name], MD;  Location: [place];  Service: Orthopedics;  Laterality: Right;  FASCIOTOMY RIGHT SMALL FINGER   FASCIOTOMY Left 08/17/2013   Procedure: FASCIOTOMY LEFT RING;  Surgeon: [name], MD;  Location: [place];  Service: Orthopedics;  Laterality: Left;   INCISION AND DRAINAGE ABSCESS Left 10/15/2015   Procedure: INCISION AND DRAINAGE ABSCESS;  Surgeon: [name], MD;  Location: [facility];  Service: General;  Laterality: Left;   KIDNEY TRANSPLANT  09/13/2010   cadaver--at [facility] HEART CATH N/A 11/15/2016   Procedure: RIGHT HEART CATH;  Surgeon: [name], MD;  Location: [place] [facility];  Service: Cardiovascular;  Laterality: N/A;   RIGHT HEART CATH N/A 05/03/2019   Procedure: RIGHT HEART CATH;  Surgeon: [name], MD;  Location: [place] [facility];  Service: Cardiovascular;  Laterality: N/A;   RIGHT HEART CATH N/A 04/04/2022   Procedure: RIGHT HEART CATH;  Surgeon: [name], MD;  Location: [place] [facility];  Service: Cardiovascular;  Laterality: N/A;   ROUX-EN-Y GASTRIC BYPASS N/A 2015   TYMPANIC MEMBRANE REPAIR Left 03/2010   VASECTOMY     Current Outpatient Medications  Medication Sig Dispense Refill   albuterol (PROVENTIL) (2.5 MG/3ML) 0.083% nebulizer solution Take 3 mLs (2.5 mg total) by nebulization every 6 (six) hours as needed for wheezing or shortness of breath. 150 mL 0   albuterol (VENTOLIN HFA) 108 (90 Base) MCG/ACT inhaler Inhale 2 puffs into the lungs every 6 (six) hours as needed for wheezing or shortness of breath. 6.7 g 5   allopurinol (ZYLOPRIM) 100 MG tablet Take 100 mg by mouth every morning.     apixaban (ELIQUIS) 5 MG TABS tablet TAKE 1 TABLET BY MOUTH TWICE DAILY (Patient taking differently: Take 5 mg by mouth 2 (two) times daily.) 180 tablet 1   beclomethasone (QVAR) 80 MCG/ACT inhaler Inhale 2 puffs into the lungs daily as  needed (for shortness of breath).     buPROPion (WELLBUTRIN SR) 150 MG 12 hr tablet TAKE 1 TABLET BY MOUTH TWICE DAILY (Patient taking differently: Take 150 mg by mouth.) 180 tablet 3   cholecalciferol (VITAMIN D3) 25 MCG (1000 UNIT) tablet Take 1,000 Units by mouth at bedtime.     colchicine 0.6 MG tablet Take 1 tablet (0.6 mg total) by mouth every other day 30 tablet 1   dapagliflozin propanediol (FARXIGA) 10 MG TABS tablet Take 1 tablet (10 mg total) by mouth daily before breakfast. 90 tablet 3   febuxostat (ULORIC) 40 MG tablet Take 1 tablet (40 mg total) by mouth daily. 90 tablet 5   fluticasone (FLONASE) 50 MCG/ACT nasal spray PLACE 2 SPRAYS INTO BOTH NOSTRILS DAILY. 16 g 11  gabapentin (NEURONTIN) 300 MG capsule Take 1 capsule (300 mg total) by mouth 2 (two) times daily. 180 capsule 3   insulin glargine-yfgn (SEMGLEE) 100 UNIT/ML Pen Inject 35 Units into the skin daily. (Patient taking differently: Inject 35 Units into the skin at bedtime.) 45 mL 3   insulin lispro (HUMALOG KWIKPEN) 100 UNIT/ML KwikPen INJECT UP TO 50 UNITS UNDER THE SKIN DAILY AS DIRECTED (Patient taking differently: Inject 10-20 Units into the skin 3 (three) times daily. Per sliding scale) 45 mL 3   Insulin Pen Needle (UNIFINE PENTIPS) 31G X 5 MM MISC Use 4 times daily as directed 400 each 3   iron polysaccharides (FERREX 150) 150 MG capsule TAKE 1 CAPSULE BY MOUTH 2 TIMES DAILY. (Patient taking differently: Take 150 mg by mouth daily.) 180 capsule 3   montelukast (SINGULAIR) 10 MG tablet TAKE 1 TABLET BY MOUTH AT BEDTIME. (Patient taking differently: Take 10 mg by mouth at bedtime.) 90 tablet 3   Multiple Vitamin (MULTIVITAMIN WITH MINERALS) TABS tablet Take 1 tablet by mouth daily.     mycophenolate (MYFORTIC) 180 MG EC tablet Take 2 tablets (360 mg total) by mouth 2 (two) times daily. 360 tablet 3   omeprazole (PRILOSEC) 20 MG capsule TAKE 1 CAPSULE BY MOUTH DAILY. (Patient taking differently: Take 20 mg by mouth at  bedtime.) 90 capsule 3   potassium chloride (KLOR-CON) 10 MEQ tablet Take 6 tablets (60 mEq total) by mouth 2 (two) times daily. 360 tablet 8   predniSONE (DELTASONE) 5 MG tablet Take 1 tablet (5 mg total) by mouth daily. 90 tablet 3   rosuvastatin (CRESTOR) 10 MG tablet Take 1 tablet (10 mg total) by mouth daily. (Patient taking differently: Take 10 mg by mouth at bedtime.) 90 tablet 3   sulfamethoxazole-trimethoprim (BACTRIM) 400-80 MG tablet TAKE 1 TABLET BY MOUTH EVERY MONDAY, WEDNESDAY, FRIDAY. (Patient taking differently: Take 1 tablet by mouth See admin instructions. Take one tablet by mouth on Monday, Wednesday and Fridays -AM) 36 tablet 3   tacrolimus (PROGRAF) 1 MG capsule Take 2 capsules (2 mg total) by mouth 2 times daily. 120 capsule 11   tamsulosin (FLOMAX) 0.4 MG CAPS capsule TAKE 1 CAPSULE BY MOUTH DAILY. (Patient taking differently: Take 0.4 mg by mouth daily after breakfast.) 90 capsule 3   Testosterone 10 MG/ACT (2%) GEL Apply 1 Pump topically daily to inner thighs - 2 pumps total. 60 g 0   zinc sulfate 220 (50 Zn) MG capsule Take 220 mg by mouth daily.     AMBULATORY NON FORMULARY MEDICATION Trimix (30/1/10)-(Pap/Phent/PGE)  Test Dose  12m vial   Qty #3 Refills 0  [facility] [phone] [phone] mL 0   colchicine 0.6 MG tablet Take 1 tablet (0.6 mg total) by mouth every other day. 30 tablet 2   Continuous Blood Gluc Sensor (FREESTYLE LIBRE 3 SENSOR) MISC Use one every 2 weeks. 6 each 3   hydrALAZINE (APRESOLINE) 25 MG tablet Take 1 tablet (25 mg total) by mouth every 8 (eight) hours. (Patient not taking: Reported on 05/30/2022) 90 tablet 1   hydrocortisone 2.5 % cream Apply topically 3 (three) times daily as needed. 30 g 3   lidocaine (LIDODERM) 5 % Place 1 patch onto the skin daily.PLACE 1 PATCH ONTO THE SKIN FOR 12 (TWELVE) HOURS. REMOVE & DISCARD PATCH WITHIN 12 HOURS OR AS DIRECTED BY MD 60 patch 11   metolazone (ZAROXOLYN) 2.5 MG tablet Take 1 tablet  by mouth 3 times weekly. 15  tablet 3   torsemide (DEMADEX) 20 MG tablet Take 5 tablets (100 mg total) by mouth 2 (two) times daily. 300 tablet 3   No current facility-administered medications for this visit.   Allergies:   Contrast media [iodinated contrast media], Iodine, and Ibuprofen   Social History:  The patient  reports that he quit smoking about 27 years ago. His smoking use included cigars. He has never been exposed to tobacco smoke. He has never used smokeless tobacco. He reports current alcohol use. He reports that he does not use drugs.   Family History:  The patient's family history includes Breast cancer in his maternal grandmother; Diabetes in his maternal grandmother; Heart disease in his father; Kidney disease in his father; Kidney failure in his father; Liver cancer in his paternal grandmother and paternal uncle; Valvular heart disease in his mother.   ROS:  Please see the history of present illness.   All other systems are personally reviewed and negative.   Recent Labs: 11/02/2021: Magnesium 2.1 04/01/2022: ALT 16; Platelets 116; TSH 2.828 04/04/2022: Hemoglobin 10.9; Hemoglobin 10.5 05/30/2022: B Natriuretic Peptide 430.9; BUN 59; Creatinine, Ser 1.63; Potassium 4.2; Sodium 142  Personally reviewed   Wt Readings from Last 3 Encounters:  05/30/22 260 lb 6.4 oz (118.1 kg)  05/29/22 252 lb (114.3 kg)  05/23/22 216 lb 14.9 oz (98.4 kg)    BP (!) 160/80   Pulse 74   Wt 260 lb 6.4 oz (118.1 kg)   SpO2 94%   BMI 37.36 kg/m  General: NAD Neck: JVP 8-9 cm with HJR, no thyromegaly or thyroid nodule.  Lungs: Clear to auscultation bilaterally with normal respiratory effort. CV: Nondisplaced PMI.  Heart irregular S1/S2, no S3/S4, no murmur.  1+ ankle edema.  No carotid bruit.  Normal pedal pulses.  Abdomen: Soft, nontender, no hepatosplenomegaly, no distention.  Skin: Intact without lesions or rashes.  Neurologic: Alert and oriented x 3.  Psych: Normal affect. Extremities: No  clubbing or cyanosis.  HEENT: Normal.   Assessment & Plan: 1. Chronic heart failure with Mid Range EF/ With prominent RV failure/pulmonary hypertension:  Ischemic cardiomyopathy.  ?If RV failure is related to severe OSA, he was a remote smoker. Echo in 2/21 with EF 45-50%, moderately dilated RV with moderately decreased systolic function, moderate pulmonary hypertension. Echo in 1/24 showed EF 40-45% , RV mod reduced, RVSP 49 mmHg, no AS, mild MR. Management complicated by CKD stage 3b.  Today, NYHA class II in terms of dyspnea.  He is volume overloaded on exam with weight up after cutting back on diuretics.  - Increase torsemide back to 100 mg bid + and metolazone back to 2.5 mg three times/week.  BMET/BNP today and BMET in 1 week.  - Continue 60 KCl bid. - With elevated BP, will restart hydralazine 12.5 mg tid but will leave off Imdur as he thinks this caused his extreme "dizziness."  - Continue Farxiga 10 mg daily. - He is now off ARB and spironolactone with elevated creatinine.   - Tadalafil was stopped during recent admission (think [place] is group 2,3). 2. Atrial fibrillation: Chronic. Rate controlled.  - Continue Eliquis. No bleeding issues. - Not on beta blocker with history of bradycardia.   3. CAD: S/p CABG 2005.  Cardiolite 12/22 with prior MI, no ischemia.  No chest pain. High threshold for cath given CKD.  - No ASA given Eliquis use.  - Continue Crestor 10 mg daily, good lipids in 2/24. 4. CKD Stage 3b: S/p renal  transplantation in 2012. Followed by [facility] center at [facility]. He remains on mycophenolate and tacrolimus.    - BMET today. 5. OSA: Continue Bipap every night.  6. HTN: Restarting hydralazine as above.   7. DM 2: On insulin. He follows with Endocrinology. - Continue dapagliflozin for now. 8. Pulmonary hypertension: Primarily pulmonary venous hypertension by RHC in 1/24 though there may be group 3 PH component from OSA.  - Now off tadalafil.  9. Chronic Anemia: has  been getting scheduled weekly IV Fe infusions, q Fridays   Followup in 3 wks with APP.    Loralie Champagne 05/30/2022

## 2022-05-30 NOTE — Patient Instructions (Signed)
INCREAse Torsemide to '100mg'$  twice daily  INCREASE Metolazone to 3 times weekly  Routine lab work today. Will notify you of abnormal results  REPEAT labs in 10days  Follow up with Darylene Price in 3 weeks  Do the following things EVERYDAY: Weigh yourself in the morning before breakfast. Write it down and keep it in a log. Take your medicines as prescribed Eat low salt foods--Limit salt (sodium) to 2000 mg per day.  Stay as active as you can everyday Limit all fluids for the day to less than 2 liters

## 2022-05-30 NOTE — Progress Notes (Signed)
   05/30/22 1038  ReDS Vest / Clip  Station Marker D  Ruler Value 38  ReDS Actual Value 43

## 2022-05-31 ENCOUNTER — Ambulatory Visit
Admission: RE | Admit: 2022-05-31 | Discharge: 2022-05-31 | Disposition: A | Discharge status: Home / Self Care | Payer: Commercial Managed Care - PPO | Attending: Surgery | Admitting: Surgery

## 2022-05-31 ENCOUNTER — Other Ambulatory Visit: Payer: Self-pay

## 2022-05-31 ENCOUNTER — Encounter: Payer: Self-pay | Admitting: Surgery

## 2022-05-31 ENCOUNTER — Ambulatory Visit: Payer: Commercial Managed Care - PPO | Admitting: Urgent Care

## 2022-05-31 ENCOUNTER — Encounter
Admission: RE | Disposition: A | Discharge status: Home / Self Care | Payer: Self-pay | Source: Home / Self Care | Attending: Surgery

## 2022-05-31 DIAGNOSIS — I4891 Unspecified atrial fibrillation: Secondary | ICD-10-CM | POA: Insufficient documentation

## 2022-05-31 DIAGNOSIS — G709 Myoneural disorder, unspecified: Secondary | ICD-10-CM | POA: Diagnosis not present

## 2022-05-31 DIAGNOSIS — E1122 Type 2 diabetes mellitus with diabetic chronic kidney disease: Secondary | ICD-10-CM | POA: Insufficient documentation

## 2022-05-31 DIAGNOSIS — N186 End stage renal disease: Secondary | ICD-10-CM | POA: Diagnosis not present

## 2022-05-31 DIAGNOSIS — I509 Heart failure, unspecified: Secondary | ICD-10-CM | POA: Diagnosis not present

## 2022-05-31 DIAGNOSIS — A630 Anogenital (venereal) warts: Secondary | ICD-10-CM | POA: Insufficient documentation

## 2022-05-31 DIAGNOSIS — Z7901 Long term (current) use of anticoagulants: Secondary | ICD-10-CM | POA: Insufficient documentation

## 2022-05-31 DIAGNOSIS — Z794 Long term (current) use of insulin: Secondary | ICD-10-CM | POA: Insufficient documentation

## 2022-05-31 DIAGNOSIS — Z951 Presence of aortocoronary bypass graft: Secondary | ICD-10-CM | POA: Diagnosis not present

## 2022-05-31 DIAGNOSIS — K6282 Dysplasia of anus: Secondary | ICD-10-CM | POA: Diagnosis not present

## 2022-05-31 DIAGNOSIS — K219 Gastro-esophageal reflux disease without esophagitis: Secondary | ICD-10-CM | POA: Insufficient documentation

## 2022-05-31 DIAGNOSIS — I480 Paroxysmal atrial fibrillation: Secondary | ICD-10-CM | POA: Diagnosis not present

## 2022-05-31 DIAGNOSIS — I132 Hypertensive heart and chronic kidney disease with heart failure and with stage 5 chronic kidney disease, or end stage renal disease: Secondary | ICD-10-CM | POA: Diagnosis not present

## 2022-05-31 DIAGNOSIS — D631 Anemia in chronic kidney disease: Secondary | ICD-10-CM | POA: Insufficient documentation

## 2022-05-31 DIAGNOSIS — Z87891 Personal history of nicotine dependence: Secondary | ICD-10-CM | POA: Insufficient documentation

## 2022-05-31 DIAGNOSIS — Z7984 Long term (current) use of oral hypoglycemic drugs: Secondary | ICD-10-CM | POA: Diagnosis not present

## 2022-05-31 DIAGNOSIS — I252 Old myocardial infarction: Secondary | ICD-10-CM | POA: Diagnosis not present

## 2022-05-31 DIAGNOSIS — I2510 Atherosclerotic heart disease of native coronary artery without angina pectoris: Secondary | ICD-10-CM | POA: Diagnosis not present

## 2022-05-31 DIAGNOSIS — I272 Pulmonary hypertension, unspecified: Secondary | ICD-10-CM | POA: Insufficient documentation

## 2022-05-31 DIAGNOSIS — G4733 Obstructive sleep apnea (adult) (pediatric): Secondary | ICD-10-CM | POA: Insufficient documentation

## 2022-05-31 DIAGNOSIS — J45909 Unspecified asthma, uncomplicated: Secondary | ICD-10-CM | POA: Diagnosis not present

## 2022-05-31 DIAGNOSIS — Z01818 Encounter for other preprocedural examination: Secondary | ICD-10-CM

## 2022-05-31 DIAGNOSIS — K4090 Unilateral inguinal hernia, without obstruction or gangrene, not specified as recurrent: Secondary | ICD-10-CM | POA: Diagnosis not present

## 2022-05-31 HISTORY — DX: Anogenital (venereal) warts: A63.0

## 2022-05-31 HISTORY — DX: Cardiac murmur, unspecified: R01.1

## 2022-05-31 HISTORY — DX: Nonrheumatic aortic (valve) stenosis: I35.0

## 2022-05-31 HISTORY — DX: Cardiomegaly: I51.7

## 2022-05-31 HISTORY — PX: WART FULGURATION: SHX5245

## 2022-05-31 HISTORY — DX: Disorders of diaphragm: J98.6

## 2022-05-31 LAB — GLUCOSE, CAPILLARY
Glucose-Capillary: 226 mg/dL — ABNORMAL HIGH (ref 70–99)
Glucose-Capillary: 232 mg/dL — ABNORMAL HIGH (ref 70–99)

## 2022-05-31 SURGERY — EXAM UNDER ANESTHESIA
Anesthesia: General | Site: Anus

## 2022-05-31 MED ORDER — INSULIN LISPRO (1 UNIT DIAL) 100 UNIT/ML (KWIKPEN): 10.0000 [IU] | PEN_INJECTOR | Freq: Three times a day (TID) | SUBCUTANEOUS | Status: DC

## 2022-05-31 MED ORDER — EPINEPHRINE PF 1 MG/ML IJ SOLN
INTRAMUSCULAR | Status: AC
  Filled 2022-05-31: qty 1

## 2022-05-31 MED ORDER — GELATIN ABSORBABLE 100 CM EX MISC
CUTANEOUS | Status: AC
  Filled 2022-05-31: qty 1

## 2022-05-31 MED ORDER — DEXMEDETOMIDINE HCL IN NACL 80 MCG/20ML IV SOLN
INTRAVENOUS | Status: DC | PRN
  Administered 2022-05-31 (×2): 4 ug via BUCCAL
  Administered 2022-05-31: 8 ug via BUCCAL
  Administered 2022-05-31: 4 ug via BUCCAL

## 2022-05-31 MED ORDER — BUPIVACAINE HCL (PF) 0.5 % IJ SOLN
INTRAMUSCULAR | Status: AC
  Filled 2022-05-31: qty 30

## 2022-05-31 MED ORDER — GELATIN ABSORBABLE 100 EX MISC
CUTANEOUS | Status: DC | PRN
  Administered 2022-05-31: 1 via TOPICAL

## 2022-05-31 MED ORDER — PROPOFOL 10 MG/ML IV BOLUS
INTRAVENOUS | Status: AC
  Filled 2022-05-31: qty 20

## 2022-05-31 MED ORDER — SODIUM CHLORIDE (PF) 0.9 % IJ SOLN
INTRAMUSCULAR | Status: AC
  Filled 2022-05-31: qty 20

## 2022-05-31 MED ORDER — CHLORHEXIDINE GLUCONATE 0.12 % MT SOLN
OROMUCOSAL | Status: AC
  Administered 2022-05-31: 15 mL via OROMUCOSAL
  Filled 2022-05-31: qty 15

## 2022-05-31 MED ORDER — BUPIVACAINE LIPOSOME 1.3 % IJ SUSP
INTRAMUSCULAR | Status: DC | PRN
  Administered 2022-05-31: 20 mL

## 2022-05-31 MED ORDER — FENTANYL CITRATE (PF) 100 MCG/2ML IJ SOLN
INTRAMUSCULAR | Status: AC
  Administered 2022-05-31: 50 ug via INTRAVENOUS
  Filled 2022-05-31: qty 2

## 2022-05-31 MED ORDER — ACETAMINOPHEN 325 MG PO TABS
650.0000 mg | ORAL_TABLET | Freq: Three times a day (TID) | ORAL | 0 refills | Status: AC | PRN
  Filled 2022-05-31: qty 40, 7d supply, fill #0

## 2022-05-31 MED ORDER — COLCHICINE 0.6 MG PO TABS: ORAL_TABLET | ORAL | 1 refills | Status: DC

## 2022-05-31 MED ORDER — LIDOCAINE 5 % EX OINT
1.0000 | TOPICAL_OINTMENT | Freq: Three times a day (TID) | CUTANEOUS | 0 refills | Status: DC | PRN
  Filled 2022-05-31: qty 50, 30d supply, fill #0

## 2022-05-31 MED ORDER — CEFAZOLIN SODIUM-DEXTROSE 2-4 GM/100ML-% IV SOLN
INTRAVENOUS | Status: AC
  Filled 2022-05-31: qty 100

## 2022-05-31 MED ORDER — ORAL CARE MOUTH RINSE: 15.0000 mL | Freq: Once | OROMUCOSAL | Status: AC

## 2022-05-31 MED ORDER — CHLORHEXIDINE GLUCONATE 0.12 % MT SOLN: 15.0000 mL | Freq: Once | OROMUCOSAL | Status: AC

## 2022-05-31 MED ORDER — BUPIVACAINE-EPINEPHRINE 0.5% -1:200000 IJ SOLN
INTRAMUSCULAR | Status: DC | PRN
  Administered 2022-05-31: 30 mL

## 2022-05-31 MED ORDER — MIDAZOLAM HCL 2 MG/2ML IJ SOLN
INTRAMUSCULAR | Status: DC | PRN
  Administered 2022-05-31: 2 mg via INTRAVENOUS

## 2022-05-31 MED ORDER — LIDOCAINE HCL (PF) 2 % IJ SOLN
INTRAMUSCULAR | Status: AC
  Filled 2022-05-31: qty 5

## 2022-05-31 MED ORDER — POLYSACCHARIDE IRON COMPLEX 150 MG PO CAPS: 150.0000 mg | ORAL_CAPSULE | Freq: Every day | ORAL | Status: DC

## 2022-05-31 MED ORDER — OXYCODONE HCL 5 MG/5ML PO SOLN: 5.0000 mg | Freq: Once | ORAL | Status: AC | PRN

## 2022-05-31 MED ORDER — KETAMINE HCL 10 MG/ML IJ SOLN
INTRAMUSCULAR | Status: DC | PRN
  Administered 2022-05-31: 25 mg via INTRAVENOUS

## 2022-05-31 MED ORDER — OXYCODONE-ACETAMINOPHEN 5-325 MG PO TABS
1.0000 | ORAL_TABLET | Freq: Three times a day (TID) | ORAL | 0 refills | Status: DC | PRN
  Filled 2022-05-31: qty 6, 2d supply, fill #0

## 2022-05-31 MED ORDER — OXYCODONE HCL 5 MG PO TABS
ORAL_TABLET | ORAL | Status: AC
  Administered 2022-05-31: 5 mg via ORAL
  Filled 2022-05-31: qty 1

## 2022-05-31 MED ORDER — OXYCODONE HCL 5 MG PO TABS: 5.0000 mg | ORAL_TABLET | Freq: Once | ORAL | Status: AC | PRN

## 2022-05-31 MED ORDER — KETAMINE HCL 50 MG/5ML IJ SOSY
PREFILLED_SYRINGE | INTRAMUSCULAR | Status: AC
  Filled 2022-05-31: qty 5

## 2022-05-31 MED ORDER — DEXMEDETOMIDINE HCL IN NACL 80 MCG/20ML IV SOLN
INTRAVENOUS | Status: AC
  Filled 2022-05-31: qty 20

## 2022-05-31 MED ORDER — ONDANSETRON HCL 4 MG/2ML IJ SOLN
INTRAMUSCULAR | Status: AC
  Filled 2022-05-31: qty 2

## 2022-05-31 MED ORDER — MIDAZOLAM HCL 2 MG/2ML IJ SOLN
INTRAMUSCULAR | Status: AC
  Filled 2022-05-31: qty 2

## 2022-05-31 MED ORDER — SODIUM CHLORIDE 0.9 % IV SOLN: INTRAVENOUS | Status: DC

## 2022-05-31 MED ORDER — ACETAMINOPHEN 10 MG/ML IV SOLN
INTRAVENOUS | Status: AC
  Filled 2022-05-31: qty 100

## 2022-05-31 MED ORDER — FENTANYL CITRATE (PF) 100 MCG/2ML IJ SOLN
25.0000 ug | INTRAMUSCULAR | Status: DC | PRN
  Administered 2022-05-31 (×2): 25 ug via INTRAVENOUS

## 2022-05-31 MED ORDER — CHLORHEXIDINE GLUCONATE CLOTH 2 % EX PADS: 6.0000 | MEDICATED_PAD | Freq: Once | CUTANEOUS | Status: DC

## 2022-05-31 MED ORDER — PROPOFOL 500 MG/50ML IV EMUL
INTRAVENOUS | Status: DC | PRN
  Administered 2022-05-31: 35 ug/kg/min via INTRAVENOUS

## 2022-05-31 MED ORDER — CEFAZOLIN SODIUM-DEXTROSE 2-4 GM/100ML-% IV SOLN
2.0000 g | INTRAVENOUS | Status: AC
  Administered 2022-05-31: 2 g via INTRAVENOUS

## 2022-05-31 MED ORDER — BUPIVACAINE LIPOSOME 1.3 % IJ SUSP
INTRAMUSCULAR | Status: AC
  Filled 2022-05-31: qty 20

## 2022-05-31 MED ORDER — FENTANYL CITRATE (PF) 100 MCG/2ML IJ SOLN
INTRAMUSCULAR | Status: AC
  Filled 2022-05-31: qty 2

## 2022-05-31 MED ORDER — FENTANYL CITRATE (PF) 100 MCG/2ML IJ SOLN
INTRAMUSCULAR | Status: DC | PRN
  Administered 2022-05-31: 25 ug via INTRAVENOUS
  Administered 2022-05-31: 50 ug via INTRAVENOUS

## 2022-05-31 MED ORDER — DEXAMETHASONE SODIUM PHOSPHATE 10 MG/ML IJ SOLN
INTRAMUSCULAR | Status: AC
  Filled 2022-05-31: qty 1

## 2022-05-31 SURGICAL SUPPLY — 31 items
ADAPTER SMOKE EVAC 7/8 TO 3/8 (ADAPTER) ×1 IMPLANT
BLADE SURG 15 STRL LF DISP TIS (BLADE) ×1 IMPLANT
BLADE SURG 15 STRL SS (BLADE) ×1
BRIEF MESH DISP 2XL (UNDERPADS AND DIAPERS) ×1 IMPLANT
DRAPE PERI LITHO V/GYN (MISCELLANEOUS) ×1 IMPLANT
DRAPE UNDER BUTTOCK W/FLU (DRAPES) ×1 IMPLANT
DRSG GAUZE FLUFF 36X18 (GAUZE/BANDAGES/DRESSINGS) ×1 IMPLANT
ELECT REM PT RETURN 9FT ADLT (ELECTROSURGICAL) ×1
ELECTRODE REM PT RTRN 9FT ADLT (ELECTROSURGICAL) ×1 IMPLANT
GAUZE 4X4 16PLY ~~LOC~~+RFID DBL (SPONGE) ×1 IMPLANT
GLOVE BIOGEL PI IND STRL 7.0 (GLOVE) ×1 IMPLANT
GLOVE SURG SYN 6.5 ES PF (GLOVE) ×1 IMPLANT
GLOVE SURG SYN 6.5 PF PI (GLOVE) ×1 IMPLANT
GOWN STRL REUS W/ TWL LRG LVL3 (GOWN DISPOSABLE) ×2 IMPLANT
GOWN STRL REUS W/TWL LRG LVL3 (GOWN DISPOSABLE) ×2
KIT TURNOVER CYSTO (KITS) ×1 IMPLANT
LABEL OR SOLS (LABEL) ×1 IMPLANT
MANIFOLD NEPTUNE II (INSTRUMENTS) ×1 IMPLANT
NEEDLE HYPO 22GX1.5 SAFETY (NEEDLE) ×2 IMPLANT
NS IRRIG 500ML POUR BTL (IV SOLUTION) ×1 IMPLANT
PACK BASIN MINOR ARMC (MISCELLANEOUS) ×1 IMPLANT
PAD PREP 24X41 OB/GYN DISP (PERSONAL CARE ITEMS) ×1 IMPLANT
SHEARS HARMONIC 9CM CVD (BLADE) IMPLANT
SOL PREP PVP 2OZ (MISCELLANEOUS) ×1
SOLUTION PREP PVP 2OZ (MISCELLANEOUS) ×1 IMPLANT
SURGILUBE 2OZ TUBE FLIPTOP (MISCELLANEOUS) ×1 IMPLANT
SYR 10ML LL (SYRINGE) ×1 IMPLANT
SYR 20ML LL LF (SYRINGE) ×1 IMPLANT
TRAP FLUID SMOKE EVACUATOR (MISCELLANEOUS) ×1 IMPLANT
TUBING SMOKE EVAC 6FT (TUBING) ×1 IMPLANT
WATER STERILE IRR 500ML POUR (IV SOLUTION) ×1 IMPLANT

## 2022-05-31 NOTE — Anesthesia Postprocedure Evaluation (Signed)
Anesthesia Post Note  Patient: [name].  Procedure(s) Performed: EXAM UNDER ANESTHESIA (Anus) FULGURATION ANAL WART (Anus)  Patient location during evaluation: PACU Anesthesia Type: General Level of consciousness: awake and alert Pain management: pain level controlled Vital Signs Assessment: post-procedure vital signs reviewed and stable Respiratory status: spontaneous breathing, nonlabored ventilation, respiratory function stable and patient connected to nasal cannula oxygen Cardiovascular status: blood pressure returned to baseline and stable Postop Assessment: no apparent nausea or vomiting Anesthetic complications: no   No notable events documented.   Last Vitals:  Vitals:   05/31/22 1045 05/31/22 1049  BP: 137/64   Pulse: (!) 55 (!) 55  Resp: 18 15  Temp:    SpO2: 96% 96%    Last Pain:  Vitals:   05/31/22 1049  TempSrc:   PainSc: 6                  Ilene Qua

## 2022-05-31 NOTE — Transfer of Care (Signed)
Immediate Anesthesia Transfer of Care Note  Patient: [name].  Procedure(s) Performed: EXAM UNDER ANESTHESIA (Anus) FULGURATION ANAL WART (Anus)  Patient Location: PACU  Anesthesia Type:General  Level of Consciousness: awake, alert , and oriented  Airway & Oxygen Therapy: Patient Spontanous Breathing and Patient connected to face mask oxygen  Post-op Assessment: Report given to RN and Post -op Vital signs reviewed and stable  Post vital signs: Reviewed and stable  Last Vitals:  Vitals Value Taken Time  BP 131/74 05/31/22 1027  Temp 36.1 C 05/31/22 1027  Pulse 62 05/31/22 1028  Resp 17 05/31/22 1028  SpO2 98 % 05/31/22 1028  Vitals shown include unvalidated device data.  Last Pain:  Vitals:   05/31/22 0830  TempSrc: Oral  PainSc: 3       Patients Stated Pain Goal: 0 ([id number])  Complications: No notable events documented.

## 2022-05-31 NOTE — Interval H&P Note (Signed)
History and Physical Interval Note:  05/31/2022 8:59 AM  [name].  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  The various methods of treatment have been discussed with the patient and family. After consideration of risks, benefits and other options for treatment, the patient has consented to  Procedure(s): EXAM UNDER ANESTHESIA (N/A) FULGURATION ANAL WART (N/A) as a surgical intervention.  The patient's history has been reviewed, patient examined, no change in status, stable for surgery.  I have reviewed the patient's chart and labs.  Questions were answered to the patient's satisfaction.     [name]

## 2022-05-31 NOTE — Op Note (Signed)
Preoperative diagnosis: Anal condyloma postoperative diagnosis: Anal condyloma placed anal mass  Procedure: Exam under anesthesia, anal condyloma fulguration and excisional biopsy of anal mass  Anesthesia: MAC  Surgeon: Lysle Pearl  Wound Classification: Clean Contaminated  Specimen: None  Complications: None  EBL: 15 mL  Indications:  Patient is a 62 y.o. male with clinical exam concerning for anal condyloma extending to rectum.Marland Kitchen  Here today for formal exam under anesthesia.  See H&P for further details.  Description of procedure: The patient was taken to the operating room and placed in high lithotomy position.  MAC anesthesia was induced without any difficulty. A time-out was completed verifying correct patient, procedure, site, positioning, and implant(s) and/or special equipment prior to beginning this procedure.  Marcaine with epi infused as perianal block.  Digital rectal exam circumferential fullness within the rectal vault.  Extensive anal condylomas ranging in size from 2 mm to several centimeters circumferentially around the anal verge.  Visualization within the lumen shortly afterwards confirmed the circumferential anal mass with bleeding and visual tissue consistent with extensive anal condyloma, possible progression to an anal neoplastic mass.  Due to the extensive involvement circumferentially, decision was made to forego full excision due to possibility of increased injury to the area and an incisional biopsy was performed by removing tissues from several areas circumferentially using the harmonic.  This tissue was sent separately from the external typical anal condylomas.  The external anal condylomas cauterized with Bovie or resected completely using electrocautery at the base.  After confirming all visible external anal condylomas removed or fulgurated, and the internal anal mass noted to not have any signs of active bleeding, Exparel infused as a additional  perianal block,  Surgifoam placed within the anal vault.  The patient tolerated the procedure well and  taken to the postoperative care unit in stable condition.  Sponge and instrument count was correct at the end of the procedure.

## 2022-05-31 NOTE — Interval H&P Note (Signed)
No change. OK to proceed.

## 2022-05-31 NOTE — Anesthesia Procedure Notes (Signed)
Date/Time: 05/31/2022 9:50 AM  Performed by: Demetrius Charity, CRNAPre-anesthesia Checklist: Patient identified, Emergency Drugs available, Suction available, Patient being monitored and Timeout performed Patient Re-evaluated:Patient Re-evaluated prior to induction Oxygen Delivery Method: Simple face mask Induction Type: IV induction Ventilation: Oral airway inserted - appropriate to patient size Placement Confirmation: CO2 detector and positive ETCO2

## 2022-05-31 NOTE — Anesthesia Preprocedure Evaluation (Signed)
Anesthesia Evaluation  Patient identified by MRN, date of birth, ID band Patient awake    Reviewed: Allergy & Precautions, NPO status , Patient's Chart, lab work & pertinent test results  History of Anesthesia Complications Negative for: history of anesthetic complications  Airway Mallampati: III  TM Distance: >3 FB Neck ROM: full    Dental no notable dental hx.    Pulmonary asthma , sleep apnea and Continuous Positive Airway Pressure Ventilation , former smoker   Pulmonary exam normal        Cardiovascular hypertension, On Medications pulmonary hypertension+ CAD, + Past MI, + CABG and +CHF  + dysrhythmias Atrial Fibrillation + Valvular Problems/Murmurs (MS) AS   EKG 05/30/2022 Wide QRS rhythm with occasional Premature ventricular complexes Left axis deviation Right bundle branch block Septal infarct , age undetermined Inferior infarct , age undetermined Abnormal ECG When compared with ECG of 17-Apr-2022 11:10, Wide QRS rhythm has replaced Atrial fibrillation  Most recent TTE was performed on 04/02/2022 revealing a mildly reduced left ventricular systolic function with an EF of 40-45%.  There was global hypokinesis.  Mild concentric LVH noted.  Right ventricular systolic function moderately reduced with a moderately enlarged right ventricular size.  PASP elevated at 48.6 mmHg.  Left atrium severely dilated.  Right atrium mild to moderately dilated.  There was mild mitral valve regurgitation.  All transvalvular gradients were noted to be normal providing no evidence suggestive of valvular stenosis.   Neuro/Psych  PSYCHIATRIC DISORDERS  Depression     Neuromuscular disease (elevated R hemidiaphragm)    GI/Hepatic Neg liver ROS,GERD  Medicated,,  Endo/Other  negative endocrine ROSdiabetes    Renal/GU ESRFRenal disease (s/p transplant)  negative genitourinary   Musculoskeletal   Abdominal   Peds  Hematology  (+) Blood  dyscrasia, anemia   Anesthesia Other Findings Past Medical History: No date: Anal condyloma No date: Anemia No date: Aortic atherosclerosis ([place]) No date: Aortic stenosis     Comment:  a.) TTE 10/2021: Mod AS. AVA 1.06cm^2 (VTI). Mean grad               40mHg; b.) TTE 04/02/2022: no AV stenosis 02/25/2013: Asthma, persistent controlled 04/26/2021: Attention or concentration deficit 09/25/2014: BPH with obstruction/lower urinary tract symptoms 2005: CAD (coronary artery disease)     Comment:  a.) LHC 1996 -> HG stenosis mLAD -> PTCA/PCI with stent               x 1 (unk type) -> complicated by CFA pseudoanurysm               (required surg);  b.) LHC 08/10/2002  -> 95% LAD, 70% ISR              LAD, 80% pOM, 70% mRCA --> CVTS consult; c.) 4v CABG               08/13/2002; c.) 03/2018 MV: Small, fixed inferoapical and               apical sep defect. No isc. EF 47% (60-65% by 05/2018 TTE);              d.) 02/2021 MV: small Apical inf fixed defect. No isc ->               low risk. No date: Cardiomegaly 07/22/2020: Cellulitis and abscess of left leg No date: Chronic atrial fibrillation     Comment:  a.) CHA2DS2VASc = 4 (CHF, HTN, vascular disease history,  T2DM);  b.) rate/rhythm maintained without               pharmacological intervention; chronically anticoagulated               with apixaban No date: Chronic combined systolic and diastolic CHF (congestive  heart failure)     Comment:  a.) TTE 7/18 : EF 45-50%; b.) TTE 05/2018 : EF 60-65%,               RVSP 74.8; c.) TTE 12/2018: EF 50-55%. Sev dil LA; d.)               TTE 04/2019: EF 45-50%; e.) TTE 07/2021: EF 40-45%, G3DD;               f.) TTE 10/2021: EF 40-45%, mild conc LVH, septal-lat               dyssynchrony (LBBB), mildly red RVSF, mild-mod dil LA,               mildly dil RA, mild-mod MS, mod AS; g.) TTE 04/02/2022:               EF 40-45%, glob HK, LVH, red RVSF, RVE, sev LAE, mild-mod              RAE,  mild MR No date: Chronic venous stasis dermatitis of both lower extremities 2017: Cytomegaloviral disease 04/22/2013: Diverticulosis of colon No date: Elevated RIGHT hemidiaphragm No date: Erectile dysfunction     Comment:  a.) on topical TRT + Trimix               (alprostadil/papaverine/phentolamine) injections No date: ESRD (end stage renal disease) ([place])     Comment:  a.) dialysis dependent 2004-2012; b.) s/p cadaveric               RIGHT renal transplant 09/13/2010 12/17/2010: Essential hypertension No date: GERD (gastroesophageal reflux disease) No date: Gout 06/30/2019: History of 2019 novel coronavirus disease (COVID-19) 10/2017: History of bilateral cataract extraction 09/13/2010: History of renal transplant     Comment:  a.) s/p cadaveric donor transplant 09/13/2010 10/2017: Hx of bilateral cataract extraction No date: Hyperlipidemia 09/25/2014: Hypogonadism in male No date: Ischemic cardiomyopathy [id number]: Left cephalic vein thrombosis No date: Long term current use of anticoagulant     Comment:  a.) apixaban No date: Long term current use of immunosuppressive drug     Comment:  a.) mycophenolate + prednisone + tacrolimus 10/04/2013: Major depressive disorder 11/07/2021: Mitral stenosis     Comment:  a.) TTE 11/07/2021: severe MAC, mild-mod MS (MPG 6               mmHg); b.) TTE 04/02/2022: no MV stenosis No date: Murmur 03/31/2012: Obesity hypoventilation syndrome 09/29/2012: OSA treated with BiPAP No date: PAH (pulmonary artery hypertension)     Comment:  a. 04/2019 RHC: RA 19, RV 80/20, PA 78/31 (51), PCWP 25,               CO/CI 7.69/3.26. PVR 3.25 --> Sev PAH, likely primarily               PV HTN; b.) RHC 04/04/2022: mRA 13, mPA 40, mPCWP 20, PA               sat 59, AO sat 92, CO 7.63, CI 3.26, PVR 2.6 --> mod               portal venous HTN No date: Pancreatitis  08/13/2002: S/P CABG x 4     Comment:  a.) LIMA-LAD, LRA-OM, SVG-D1, SVG-dRCA No date: S/P  gastric bypass 06/11/2019: Synovitis of finger 12/23/2018: Trigger finger, unspecified little finger 09/09/2014: Type 2 diabetes mellitus with hyperglycemia, with long- term current use of insulin     Comment:  a.) has Free[employer] Libre CGM No date: Type 2 MI (myocardial infarction) ([place]) 05/2016: Ulcer     Comment:  Left shin No date: Wears glasses  Past Surgical History: 08/10/2002: CARDIAC CATHETERIZATION; N/A 10/29/2017: CATARACT EXTRACTION W/PHACO; Right     Comment:  Procedure: CATARACT EXTRACTION PHACO AND INTRAOCULAR               LENS PLACEMENT ([place])  RIGHT DIABETIC;  Surgeon:               [name], MD;  Location: [facility];              Service: Ophthalmology;  Laterality: Right 11/18/2017: CATARACT EXTRACTION W/PHACO; Left     Comment:  Procedure: CATARACT EXTRACTION PHACO AND INTRAOCULAR               LENS PLACEMENT ([place]) LEFT IVA/TOPICAL;  Surgeon:               [name], MD;  Location: [facility];              Service: Ophthalmology;  Laterality: Left 04/22/2013: COLONOSCOPY WITH PROPOFOL; N/A     Comment:  Procedure: COLONOSCOPY WITH PROPOFOL;  Surgeon: [name], MD;  Location: [facility];  Service: Endoscopy;              Laterality: N/A; 1996: CORONARY ANGIOPLASTY WITH STENT PLACEMENT; N/A 08/13/2002: CORONARY ARTERY BYPASS GRAFT; N/A     Comment:  Procedure: 4v CORONARY ARTERY BYPASS GRAFTING; [name]; Surgeon: [name], MD 12/25/2021: CYSTOSCOPY WITH INSERTION OF UROLIFT; N/A     Comment:  Procedure: CYSTOSCOPY WITH INSERTION OF UROLIFT;                Surgeon: [name], MD;  Location: [facility];                Service: Urology;  Laterality: N/A; No date: DG ANGIO AV SHUNT*L*     Comment:  right and left upper arms 03/03/2012: FASCIOTOMY     Comment:  Procedure: FASCIOTOMY;  Surgeon: [name], MD;                Location: [facility];   Service:               Orthopedics;  Laterality: Right;  FASCIOTOMY RIGHT SMALL               FINGER 08/17/2013: FASCIOTOMY; Left     Comment:  Procedure: FASCIOTOMY LEFT RING;  Surgeon: [name],              MD;  Location: [facility];  Service:               Orthopedics;  Laterality: Left; 10/15/2015: INCISION AND DRAINAGE ABSCESS; Left     Comment:  Procedure: INCISION AND DRAINAGE ABSCESS;  Surgeon:               [name], MD;  Location: [facility];  Service:               General;  Laterality: Left; 09/13/2010: KIDNEY TRANSPLANT     Comment:  cadaver--at [facility] 11/15/2016: RIGHT HEART CATH; N/A     Comment:  Procedure: RIGHT HEART CATH;  Surgeon: [name], MD;  Location: [facility];  Service:               Cardiovascular;  Laterality: N/A; 05/03/2019: RIGHT HEART CATH; N/A     Comment:  Procedure: RIGHT HEART CATH;  Surgeon: [name],              MD;  Location: [facility];  Service:               Cardiovascular;  Laterality: N/A; 04/04/2022: RIGHT HEART CATH; N/A     Comment:  Procedure: RIGHT HEART CATH;  Surgeon: [name],              MD;  Location: [facility];  Service:               Cardiovascular;  Laterality: N/A; 2015: ROUX-EN-Y GASTRIC BYPASS; N/A 03/2010: TYMPANIC MEMBRANE REPAIR; Left No date: VASECTOMY  BMI    Body Mass Index: 36.45 kg/m      Reproductive/Obstetrics negative OB ROS                             Anesthesia Physical Anesthesia Plan  ASA: 3  Anesthesia Plan: General   Post-op Pain Management: Ofirmev IV (intra-op)*   Induction: Intravenous  PONV Risk Score and Plan: 2 and Propofol infusion and TIVA  Airway Management Planned: Natural Airway and Nasal Cannula  Additional Equipment:   Intra-op Plan:   Post-operative Plan:   Informed Consent: I have reviewed the patients History and Physical, chart, labs and discussed the  procedure including the risks, benefits and alternatives for the proposed anesthesia with the patient or authorized representative who has indicated his/her understanding and acceptance.     Dental Advisory Given  Plan Discussed with: Anesthesiologist, CRNA and Surgeon  Anesthesia Plan Comments: (Patient consented for risks of anesthesia including but not limited to:  - adverse reactions to medications - risk of airway placement if required - damage to eyes, teeth, lips or other oral mucosa - nerve damage due to positioning  - sore throat or hoarseness - Damage to heart, brain, nerves, lungs, other parts of body or loss of life  Patient voiced understanding.)        Anesthesia Quick Evaluation

## 2022-05-31 NOTE — Discharge Instructions (Addendum)
AMBULATORY SURGERY  DISCHARGE INSTRUCTIONS    removal, Care After This sheet gives you information about how to care for yourself after your procedure. Your health care provider may also give you more specific instructions. If you have problems or questions, contact your health care provider. What can I expect after the procedure? After the procedure, it is common to have: Soreness. Bruising. Itching.  Follow these instructions at home: site care Follow instructions from your health care provider about how to take care of your site. Make sure you: LEAVE packing in place until it falls out on its own.  No need to replace afterwards Leave stitches (sutures), skin glue, or adhesive strips in place.  If the area bleeds or bruises, apply gentle pressure for 10 minutes. OK TO SHOWER IN 24HRS  General instructions Rest and then return to your normal activities as told by your health care provider. tylenol and lidocaine cream as needed for discomfort.     Use narcotics, if prescribed, only when tylenol  is not enough to control pain.  325-'650mg'$  every 8hrs to max of '3000mg'$ [id number] (including the '325mg'$  in every norco dose) for the tylenol.    RESUME ELIQUIS IN 48HRS  Keep all follow-up visits as told by your health care provider. This is important. Contact a health care provider if you have excessive: redness, swelling, or pain around your site. blood coming from your site. pus or a bad smell coming from your site. You have a fever.  Get help right away if: You have bleeding that does not stop with pressure or a dressing. Summary After the procedure, it is common to have some soreness, bruising, and itching at the site. Follow instructions from your health care provider about how to take care of your site. Keep all follow-up visits as told by your health care provider. This is important. This information is not intended to replace advice given to you by your health care provider. Make  sure you discuss any questions you have with your health care provider. Document Released: 04/07/2015 Document Revised: 09/08/2017 Document Reviewed: 09/08/2017 Elsevier Interactive Patient Education  [employer].  The drugs that you were given will stay in your system until tomorrow so for the next 24 hours you should not:  Drive an automobile Make any legal decisions Drink any alcoholic beverage   You may resume regular meals tomorrow.  Today it is better to start with liquids and gradually work up to solid foods.  You may eat anything you prefer, but it is better to start with liquids, then soup and crackers, and gradually work up to solid foods.   Please notify your doctor immediately if you have any unusual bleeding, trouble breathing, redness and pain at the surgery site, drainage, fever, or pain not relieved by medication.    Your post-operative visit with Dr.                                       is: Date:                        Time:    Please call to schedule your post-operative visit.  Additional Instructions: PLEASE LEAVE GREEN/TEAL BRACELET ON FOR 4 DAYS

## 2022-06-01 ENCOUNTER — Encounter: Payer: Self-pay | Admitting: Surgery

## 2022-06-03 ENCOUNTER — Other Ambulatory Visit: Payer: Self-pay | Admitting: Surgery

## 2022-06-03 ENCOUNTER — Other Ambulatory Visit: Payer: Self-pay

## 2022-06-03 DIAGNOSIS — A630 Anogenital (venereal) warts: Secondary | ICD-10-CM

## 2022-06-03 LAB — SURGICAL PATHOLOGY

## 2022-06-03 MED ORDER — TRAMADOL HCL 50 MG PO TABS
50.0000 mg | ORAL_TABLET | Freq: Four times a day (QID) | ORAL | 0 refills | Status: AC | PRN
  Filled 2022-06-03: qty 12, 3d supply, fill #0

## 2022-06-03 MED ORDER — ISOSORBIDE MONONITRATE ER 30 MG PO TB24
30.0000 mg | ORAL_TABLET | Freq: Every day | ORAL | 2 refills | Status: DC
  Filled 2022-06-03: qty 30, 30d supply, fill #0

## 2022-06-05 ENCOUNTER — Ambulatory Visit: Payer: Commercial Managed Care - PPO | Admitting: Nurse Practitioner

## 2022-06-06 DIAGNOSIS — H02402 Unspecified ptosis of left eyelid: Secondary | ICD-10-CM | POA: Diagnosis not present

## 2022-06-07 DIAGNOSIS — N184 Chronic kidney disease, stage 4 (severe): Secondary | ICD-10-CM | POA: Diagnosis not present

## 2022-06-07 DIAGNOSIS — D631 Anemia in chronic kidney disease: Secondary | ICD-10-CM | POA: Diagnosis not present

## 2022-06-10 ENCOUNTER — Encounter: Payer: Self-pay | Admitting: Surgery

## 2022-06-13 ENCOUNTER — Other Ambulatory Visit: Payer: Self-pay

## 2022-06-13 MED ORDER — FREESTYLE LIBRE 3 SENSOR MISC
3 refills | Status: DC
  Filled 2022-06-13 – 2022-07-26 (×2): qty 6, 84d supply, fill #0
  Filled 2022-10-31: qty 6, 84d supply, fill #1

## 2022-06-16 ENCOUNTER — Other Ambulatory Visit: Payer: Self-pay

## 2022-06-17 ENCOUNTER — Other Ambulatory Visit: Payer: Self-pay | Admitting: Internal Medicine

## 2022-06-17 ENCOUNTER — Other Ambulatory Visit: Payer: Self-pay

## 2022-06-17 MED ORDER — COLCHICINE 0.6 MG PO TABS
0.6000 mg | ORAL_TABLET | ORAL | 1 refills | Status: DC
  Filled 2022-06-17 – 2022-07-25 (×3): qty 45, 90d supply, fill #0

## 2022-06-17 MED ORDER — BUPROPION HCL ER (SR) 150 MG PO TB12
150.0000 mg | ORAL_TABLET | Freq: Two times a day (BID) | ORAL | 3 refills | Status: DC
  Filled 2022-06-17: qty 180, 90d supply, fill #0
  Filled 2022-09-13: qty 180, 90d supply, fill #1
  Filled 2023-01-15: qty 180, 90d supply, fill #2
  Filled 2023-03-12 – 2023-04-19 (×2): qty 180, 90d supply, fill #3

## 2022-06-18 NOTE — Telephone Encounter (Signed)
Surgery has been completed. Closing this encounter

## 2022-06-21 ENCOUNTER — Other Ambulatory Visit: Payer: Self-pay

## 2022-06-21 ENCOUNTER — Emergency Department: Payer: Commercial Managed Care - PPO

## 2022-06-21 ENCOUNTER — Inpatient Hospital Stay
Admission: EM | Admit: 2022-06-21 | Discharge: 2022-06-28 | DRG: 871 | Disposition: A | Discharge status: Home / Self Care | Payer: Commercial Managed Care - PPO | Attending: Student | Admitting: Student

## 2022-06-21 ENCOUNTER — Inpatient Hospital Stay: Payer: Commercial Managed Care - PPO

## 2022-06-21 ENCOUNTER — Encounter: Payer: Self-pay | Admitting: Intensive Care

## 2022-06-21 ENCOUNTER — Encounter: Payer: Commercial Managed Care - PPO | Admitting: Family

## 2022-06-21 DIAGNOSIS — Z800 Family history of malignant neoplasm of digestive organs: Secondary | ICD-10-CM

## 2022-06-21 DIAGNOSIS — I2723 Pulmonary hypertension due to lung diseases and hypoxia: Secondary | ICD-10-CM | POA: Diagnosis not present

## 2022-06-21 DIAGNOSIS — Z9841 Cataract extraction status, right eye: Secondary | ICD-10-CM

## 2022-06-21 DIAGNOSIS — A419 Sepsis, unspecified organism: Secondary | ICD-10-CM

## 2022-06-21 DIAGNOSIS — Z79899 Other long term (current) drug therapy: Secondary | ICD-10-CM

## 2022-06-21 DIAGNOSIS — N401 Enlarged prostate with lower urinary tract symptoms: Secondary | ICD-10-CM | POA: Diagnosis present

## 2022-06-21 DIAGNOSIS — N12 Tubulo-interstitial nephritis, not specified as acute or chronic: Secondary | ICD-10-CM

## 2022-06-21 DIAGNOSIS — J9811 Atelectasis: Secondary | ICD-10-CM | POA: Diagnosis not present

## 2022-06-21 DIAGNOSIS — J9601 Acute respiratory failure with hypoxia: Secondary | ICD-10-CM | POA: Insufficient documentation

## 2022-06-21 DIAGNOSIS — I2489 Other forms of acute ischemic heart disease: Secondary | ICD-10-CM | POA: Diagnosis present

## 2022-06-21 DIAGNOSIS — T8613 Kidney transplant infection: Secondary | ICD-10-CM | POA: Diagnosis present

## 2022-06-21 DIAGNOSIS — I700 Atherosclerosis of aorta: Secondary | ICD-10-CM | POA: Diagnosis present

## 2022-06-21 DIAGNOSIS — Z9842 Cataract extraction status, left eye: Secondary | ICD-10-CM

## 2022-06-21 DIAGNOSIS — J45998 Other asthma: Secondary | ICD-10-CM | POA: Diagnosis present

## 2022-06-21 DIAGNOSIS — I255 Ischemic cardiomyopathy: Secondary | ICD-10-CM

## 2022-06-21 DIAGNOSIS — I130 Hypertensive heart and chronic kidney disease with heart failure and stage 1 through stage 4 chronic kidney disease, or unspecified chronic kidney disease: Secondary | ICD-10-CM | POA: Diagnosis present

## 2022-06-21 DIAGNOSIS — M109 Gout, unspecified: Secondary | ICD-10-CM | POA: Diagnosis present

## 2022-06-21 DIAGNOSIS — R7881 Bacteremia: Secondary | ICD-10-CM | POA: Diagnosis not present

## 2022-06-21 DIAGNOSIS — R5381 Other malaise: Secondary | ICD-10-CM | POA: Diagnosis present

## 2022-06-21 DIAGNOSIS — N10 Acute tubulo-interstitial nephritis: Secondary | ICD-10-CM | POA: Diagnosis not present

## 2022-06-21 DIAGNOSIS — R918 Other nonspecific abnormal finding of lung field: Secondary | ICD-10-CM | POA: Diagnosis not present

## 2022-06-21 DIAGNOSIS — N136 Pyonephrosis: Secondary | ICD-10-CM | POA: Diagnosis not present

## 2022-06-21 DIAGNOSIS — Z955 Presence of coronary angioplasty implant and graft: Secondary | ICD-10-CM

## 2022-06-21 DIAGNOSIS — I129 Hypertensive chronic kidney disease with stage 1 through stage 4 chronic kidney disease, or unspecified chronic kidney disease: Secondary | ICD-10-CM | POA: Diagnosis not present

## 2022-06-21 DIAGNOSIS — I272 Pulmonary hypertension, unspecified: Secondary | ICD-10-CM | POA: Diagnosis not present

## 2022-06-21 DIAGNOSIS — I4821 Permanent atrial fibrillation: Secondary | ICD-10-CM

## 2022-06-21 DIAGNOSIS — Y830 Surgical operation with transplant of whole organ as the cause of abnormal reaction of the patient, or of later complication, without mention of misadventure at the time of the procedure: Secondary | ICD-10-CM | POA: Diagnosis present

## 2022-06-21 DIAGNOSIS — A4153 Sepsis due to Serratia: Principal | ICD-10-CM | POA: Diagnosis present

## 2022-06-21 DIAGNOSIS — R6521 Severe sepsis with septic shock: Secondary | ICD-10-CM | POA: Diagnosis present

## 2022-06-21 DIAGNOSIS — E1122 Type 2 diabetes mellitus with diabetic chronic kidney disease: Secondary | ICD-10-CM | POA: Diagnosis present

## 2022-06-21 DIAGNOSIS — N529 Male erectile dysfunction, unspecified: Secondary | ICD-10-CM | POA: Diagnosis present

## 2022-06-21 DIAGNOSIS — Z940 Kidney transplant status: Secondary | ICD-10-CM | POA: Diagnosis not present

## 2022-06-21 DIAGNOSIS — R0602 Shortness of breath: Secondary | ICD-10-CM | POA: Diagnosis not present

## 2022-06-21 DIAGNOSIS — I959 Hypotension, unspecified: Secondary | ICD-10-CM | POA: Diagnosis not present

## 2022-06-21 DIAGNOSIS — R519 Headache, unspecified: Secondary | ICD-10-CM | POA: Diagnosis not present

## 2022-06-21 DIAGNOSIS — R509 Fever, unspecified: Secondary | ICD-10-CM | POA: Diagnosis not present

## 2022-06-21 DIAGNOSIS — E662 Morbid (severe) obesity with alveolar hypoventilation: Secondary | ICD-10-CM | POA: Diagnosis present

## 2022-06-21 DIAGNOSIS — R000 Tachycardia, unspecified: Secondary | ICD-10-CM | POA: Diagnosis not present

## 2022-06-21 DIAGNOSIS — N390 Urinary tract infection, site not specified: Secondary | ICD-10-CM | POA: Diagnosis not present

## 2022-06-21 DIAGNOSIS — J189 Pneumonia, unspecified organism: Secondary | ICD-10-CM | POA: Diagnosis present

## 2022-06-21 DIAGNOSIS — N1330 Unspecified hydronephrosis: Secondary | ICD-10-CM | POA: Diagnosis not present

## 2022-06-21 DIAGNOSIS — Z8249 Family history of ischemic heart disease and other diseases of the circulatory system: Secondary | ICD-10-CM

## 2022-06-21 DIAGNOSIS — A498 Other bacterial infections of unspecified site: Secondary | ICD-10-CM

## 2022-06-21 DIAGNOSIS — N400 Enlarged prostate without lower urinary tract symptoms: Secondary | ICD-10-CM | POA: Diagnosis present

## 2022-06-21 DIAGNOSIS — D631 Anemia in chronic kidney disease: Secondary | ICD-10-CM | POA: Diagnosis present

## 2022-06-21 DIAGNOSIS — Z7951 Long term (current) use of inhaled steroids: Secondary | ICD-10-CM

## 2022-06-21 DIAGNOSIS — I2722 Pulmonary hypertension due to left heart disease: Secondary | ICD-10-CM | POA: Diagnosis not present

## 2022-06-21 DIAGNOSIS — R310 Gross hematuria: Secondary | ICD-10-CM | POA: Diagnosis present

## 2022-06-21 DIAGNOSIS — G4733 Obstructive sleep apnea (adult) (pediatric): Secondary | ICD-10-CM | POA: Diagnosis not present

## 2022-06-21 DIAGNOSIS — I080 Rheumatic disorders of both mitral and aortic valves: Secondary | ICD-10-CM | POA: Diagnosis not present

## 2022-06-21 DIAGNOSIS — E1129 Type 2 diabetes mellitus with other diabetic kidney complication: Secondary | ICD-10-CM | POA: Diagnosis present

## 2022-06-21 DIAGNOSIS — Z79621 Long term (current) use of calcineurin inhibitor: Secondary | ICD-10-CM

## 2022-06-21 DIAGNOSIS — N184 Chronic kidney disease, stage 4 (severe): Secondary | ICD-10-CM | POA: Diagnosis not present

## 2022-06-21 DIAGNOSIS — E785 Hyperlipidemia, unspecified: Secondary | ICD-10-CM | POA: Diagnosis present

## 2022-06-21 DIAGNOSIS — E876 Hypokalemia: Secondary | ICD-10-CM | POA: Diagnosis present

## 2022-06-21 DIAGNOSIS — Z841 Family history of disorders of kidney and ureter: Secondary | ICD-10-CM

## 2022-06-21 DIAGNOSIS — K219 Gastro-esophageal reflux disease without esophagitis: Secondary | ICD-10-CM | POA: Diagnosis present

## 2022-06-21 DIAGNOSIS — Z9861 Coronary angioplasty status: Secondary | ICD-10-CM

## 2022-06-21 DIAGNOSIS — Z1152 Encounter for screening for COVID-19: Secondary | ICD-10-CM

## 2022-06-21 DIAGNOSIS — N179 Acute kidney failure, unspecified: Secondary | ICD-10-CM | POA: Diagnosis not present

## 2022-06-21 DIAGNOSIS — Z560 Unemployment, unspecified: Secondary | ICD-10-CM

## 2022-06-21 DIAGNOSIS — J45909 Unspecified asthma, uncomplicated: Secondary | ICD-10-CM | POA: Diagnosis not present

## 2022-06-21 DIAGNOSIS — N183 Chronic kidney disease, stage 3 unspecified: Secondary | ICD-10-CM | POA: Diagnosis not present

## 2022-06-21 DIAGNOSIS — Z86718 Personal history of other venous thrombosis and embolism: Secondary | ICD-10-CM

## 2022-06-21 DIAGNOSIS — Z803 Family history of malignant neoplasm of breast: Secondary | ICD-10-CM

## 2022-06-21 DIAGNOSIS — I10 Essential (primary) hypertension: Secondary | ICD-10-CM | POA: Diagnosis present

## 2022-06-21 DIAGNOSIS — I2510 Atherosclerotic heart disease of native coronary artery without angina pectoris: Secondary | ICD-10-CM | POA: Diagnosis not present

## 2022-06-21 DIAGNOSIS — I252 Old myocardial infarction: Secondary | ICD-10-CM

## 2022-06-21 DIAGNOSIS — K7689 Other specified diseases of liver: Secondary | ICD-10-CM | POA: Diagnosis not present

## 2022-06-21 DIAGNOSIS — N16 Renal tubulo-interstitial disorders in diseases classified elsewhere: Secondary | ICD-10-CM | POA: Diagnosis present

## 2022-06-21 DIAGNOSIS — Z7901 Long term (current) use of anticoagulants: Secondary | ICD-10-CM

## 2022-06-21 DIAGNOSIS — N170 Acute kidney failure with tubular necrosis: Secondary | ICD-10-CM | POA: Diagnosis not present

## 2022-06-21 DIAGNOSIS — Z87891 Personal history of nicotine dependence: Secondary | ICD-10-CM

## 2022-06-21 DIAGNOSIS — Z6839 Body mass index (BMI) 39.0-39.9, adult: Secondary | ICD-10-CM

## 2022-06-21 DIAGNOSIS — Y92239 Unspecified place in hospital as the place of occurrence of the external cause: Secondary | ICD-10-CM | POA: Diagnosis not present

## 2022-06-21 DIAGNOSIS — I5022 Chronic systolic (congestive) heart failure: Secondary | ICD-10-CM | POA: Diagnosis present

## 2022-06-21 DIAGNOSIS — N1831 Chronic kidney disease, stage 3a: Secondary | ICD-10-CM | POA: Diagnosis not present

## 2022-06-21 DIAGNOSIS — I5042 Chronic combined systolic (congestive) and diastolic (congestive) heart failure: Secondary | ICD-10-CM | POA: Diagnosis present

## 2022-06-21 DIAGNOSIS — D84821 Immunodeficiency due to drugs: Secondary | ICD-10-CM | POA: Diagnosis not present

## 2022-06-21 DIAGNOSIS — Z951 Presence of aortocoronary bypass graft: Secondary | ICD-10-CM

## 2022-06-21 DIAGNOSIS — I447 Left bundle-branch block, unspecified: Secondary | ICD-10-CM | POA: Diagnosis present

## 2022-06-21 DIAGNOSIS — I25118 Atherosclerotic heart disease of native coronary artery with other forms of angina pectoris: Secondary | ICD-10-CM

## 2022-06-21 DIAGNOSIS — I5023 Acute on chronic systolic (congestive) heart failure: Secondary | ICD-10-CM | POA: Diagnosis present

## 2022-06-21 DIAGNOSIS — E119 Type 2 diabetes mellitus without complications: Secondary | ICD-10-CM | POA: Diagnosis present

## 2022-06-21 DIAGNOSIS — Z794 Long term (current) use of insulin: Secondary | ICD-10-CM

## 2022-06-21 DIAGNOSIS — I872 Venous insufficiency (chronic) (peripheral): Secondary | ICD-10-CM | POA: Diagnosis present

## 2022-06-21 DIAGNOSIS — K7460 Unspecified cirrhosis of liver: Secondary | ICD-10-CM | POA: Diagnosis present

## 2022-06-21 DIAGNOSIS — Z8616 Personal history of COVID-19: Secondary | ICD-10-CM | POA: Diagnosis not present

## 2022-06-21 DIAGNOSIS — R410 Disorientation, unspecified: Secondary | ICD-10-CM | POA: Diagnosis not present

## 2022-06-21 DIAGNOSIS — I459 Conduction disorder, unspecified: Secondary | ICD-10-CM | POA: Diagnosis present

## 2022-06-21 DIAGNOSIS — T502X5A Adverse effect of carbonic-anhydrase inhibitors, benzothiadiazides and other diuretics, initial encounter: Secondary | ICD-10-CM | POA: Diagnosis not present

## 2022-06-21 DIAGNOSIS — I493 Ventricular premature depolarization: Secondary | ICD-10-CM | POA: Diagnosis present

## 2022-06-21 DIAGNOSIS — Z9884 Bariatric surgery status: Secondary | ICD-10-CM

## 2022-06-21 DIAGNOSIS — Z833 Family history of diabetes mellitus: Secondary | ICD-10-CM

## 2022-06-21 DIAGNOSIS — Z941 Heart transplant status: Secondary | ICD-10-CM | POA: Diagnosis not present

## 2022-06-21 LAB — CBC WITH DIFFERENTIAL/PLATELET
Abs Immature Granulocytes: 0.14 10*3/uL — ABNORMAL HIGH (ref 0.00–0.07)
Basophils Absolute: 0.1 10*3/uL (ref 0.0–0.1)
Basophils Relative: 0 %
Eosinophils Absolute: 0 10*3/uL (ref 0.0–0.5)
Eosinophils Relative: 0 %
HCT: 41.9 % (ref 39.0–52.0)
Hemoglobin: 12.3 g/dL — ABNORMAL LOW (ref 13.0–17.0)
Immature Granulocytes: 1 %
Lymphocytes Relative: 6 %
Lymphs Abs: 0.8 10*3/uL (ref 0.7–4.0)
MCH: 23 pg — ABNORMAL LOW (ref 26.0–34.0)
MCHC: 29.4 g/dL — ABNORMAL LOW (ref 30.0–36.0)
MCV: 78.3 fL — ABNORMAL LOW (ref 80.0–100.0)
Monocytes Absolute: 0.9 10*3/uL (ref 0.1–1.0)
Monocytes Relative: 7 %
Neutro Abs: 11.1 10*3/uL — ABNORMAL HIGH (ref 1.7–7.7)
Neutrophils Relative %: 86 %
Platelets: 142 10*3/uL — ABNORMAL LOW (ref 150–400)
RBC: 5.35 MIL/uL (ref 4.22–5.81)
RDW: 19.9 % — ABNORMAL HIGH (ref 11.5–15.5)
WBC: 10.9 10*3/uL — ABNORMAL HIGH (ref 4.0–10.5)
nRBC: 0 % (ref 0.0–0.2)

## 2022-06-21 LAB — URINALYSIS, ROUTINE W REFLEX MICROSCOPIC
RBC / HPF: >50 RBC/hpf (ref 0–5)
Specific Gravity, Urine: 1.014 (ref 1.005–1.030)
Squamous Epithelial / HPF: NONE SEEN /HPF (ref 0–5)
WBC, UA: >50 WBC/hpf (ref 0–5)

## 2022-06-21 LAB — BRAIN NATRIURETIC PEPTIDE: B Natriuretic Peptide: 465 pg/mL — ABNORMAL HIGH (ref 0.0–100.0)

## 2022-06-21 LAB — GLUCOSE, CAPILLARY
Glucose-Capillary: 119 mg/dL — ABNORMAL HIGH (ref 70–99)
Glucose-Capillary: 122 mg/dL — ABNORMAL HIGH (ref 70–99)
Glucose-Capillary: 126 mg/dL — ABNORMAL HIGH (ref 70–99)
Glucose-Capillary: 146 mg/dL — ABNORMAL HIGH (ref 70–99)

## 2022-06-21 LAB — COMPREHENSIVE METABOLIC PANEL
ALT: 14 U/L (ref 0–44)
AST: 25 U/L (ref 15–41)
Albumin: 3.4 g/dL — ABNORMAL LOW (ref 3.5–5.0)
Alkaline Phosphatase: 114 U/L (ref 38–126)
Anion gap: 10 (ref 5–15)
BUN: 63 mg/dL — ABNORMAL HIGH (ref 8–23)
CO2: 32 mmol/L (ref 22–32)
Calcium: 9.1 mg/dL (ref 8.9–10.3)
Chloride: 98 mmol/L (ref 98–111)
Creatinine, Ser: 2.18 mg/dL — ABNORMAL HIGH (ref 0.61–1.24)
GFR, Estimated: 33 mL/min — ABNORMAL LOW (ref >60)
Glucose, Bld: 93 mg/dL (ref 70–99)
Potassium: 4 mmol/L (ref 3.5–5.1)
Sodium: 140 mmol/L (ref 135–145)
Total Bilirubin: 1.3 mg/dL — ABNORMAL HIGH (ref 0.3–1.2)
Total Protein: 7.1 g/dL (ref 6.5–8.1)

## 2022-06-21 LAB — PROTIME-INR
INR: 1.7 — ABNORMAL HIGH (ref 0.8–1.2)
Prothrombin Time: 19.8 seconds — ABNORMAL HIGH (ref 11.4–15.2)

## 2022-06-21 LAB — BLOOD GAS, ARTERIAL
Acid-Base Excess: 12.3 mmol/L — ABNORMAL HIGH (ref 0.0–2.0)
Bicarbonate: 36.7 mmol/L — ABNORMAL HIGH (ref 20.0–28.0)
FIO2: 0.28 %
O2 Saturation: 99.1 %
Patient temperature: 37
pCO2 arterial: 45 mmHg (ref 32–48)
pH, Arterial: 7.52 — ABNORMAL HIGH (ref 7.35–7.45)
pO2, Arterial: 105 mmHg (ref 83–108)

## 2022-06-21 LAB — HEMOGLOBIN A1C
Hgb A1c MFr Bld: 8.2 % — ABNORMAL HIGH (ref 4.8–5.6)
Mean Plasma Glucose: 189 mg/dL

## 2022-06-21 LAB — AMMONIA: Ammonia: 19 umol/L (ref 9–35)

## 2022-06-21 LAB — TROPONIN I (HIGH SENSITIVITY)
Troponin I (High Sensitivity): 97 ng/L — ABNORMAL HIGH (ref <18)
Troponin I (High Sensitivity): 126 ng/L (ref <18)

## 2022-06-21 LAB — APTT: aPTT: 38 seconds — ABNORMAL HIGH (ref 24–36)

## 2022-06-21 LAB — MRSA NEXT GEN BY PCR, NASAL: MRSA by PCR Next Gen: NOT DETECTED

## 2022-06-21 LAB — CBG MONITORING, ED: Glucose-Capillary: 106 mg/dL — ABNORMAL HIGH (ref 70–99)

## 2022-06-21 LAB — LACTIC ACID, PLASMA
Lactic Acid, Venous: 2.1 mmol/L (ref 0.5–1.9)
Lactic Acid, Venous: 1.8 mmol/L (ref 0.5–1.9)

## 2022-06-21 LAB — RESP PANEL BY RT-PCR (RSV, FLU A&B, COVID)  RVPGX2
Influenza A by PCR: NEGATIVE
Influenza B by PCR: NEGATIVE
Resp Syncytial Virus by PCR: NEGATIVE
SARS Coronavirus 2 by RT PCR: NEGATIVE

## 2022-06-21 LAB — CK: Total CK: 38 U/L — ABNORMAL LOW (ref 49–397)

## 2022-06-21 MED ORDER — BUPROPION HCL ER (SR) 150 MG PO TB12
150.0000 mg | ORAL_TABLET | Freq: Two times a day (BID) | ORAL | Status: DC
  Administered 2022-06-21 – 2022-06-28 (×14): 150 mg via ORAL
  Filled 2022-06-21 (×14): qty 1

## 2022-06-21 MED ORDER — CHLORHEXIDINE GLUCONATE CLOTH 2 % EX PADS
6.0000 | MEDICATED_PAD | Freq: Every day | CUTANEOUS | Status: DC
  Administered 2022-06-22 – 2022-06-23 (×2): 6 via TOPICAL

## 2022-06-21 MED ORDER — LACTATED RINGERS IV SOLN
150.0000 mL/h | INTRAVENOUS | Status: DC
  Administered 2022-06-21: 150 mL/h via INTRAVENOUS

## 2022-06-21 MED ORDER — PREDNISONE 10 MG PO TABS
5.0000 mg | ORAL_TABLET | Freq: Every day | ORAL | Status: DC
  Administered 2022-06-21 – 2022-06-28 (×8): 5 mg via ORAL
  Filled 2022-06-21 (×8): qty 1

## 2022-06-21 MED ORDER — DEXTROSE 50 % IV SOLN
1.0000 | Freq: Once | INTRAVENOUS | Status: AC
  Administered 2022-06-21: 50 mL via INTRAVENOUS
  Filled 2022-06-21: qty 50

## 2022-06-21 MED ORDER — ONDANSETRON HCL 4 MG/2ML IJ SOLN: 4.0000 mg | Freq: Four times a day (QID) | INTRAMUSCULAR | Status: DC | PRN

## 2022-06-21 MED ORDER — ONDANSETRON HCL 4 MG PO TABS: 4.0000 mg | ORAL_TABLET | Freq: Four times a day (QID) | ORAL | Status: DC | PRN

## 2022-06-21 MED ORDER — SODIUM CHLORIDE 0.9 % IV SOLN
2.0000 g | Freq: Two times a day (BID) | INTRAVENOUS | Status: DC
  Administered 2022-06-21 – 2022-06-25 (×9): 2 g via INTRAVENOUS
  Filled 2022-06-21 (×3): qty 12.5
  Filled 2022-06-21: qty 2
  Filled 2022-06-21 (×2): qty 12.5
  Filled 2022-06-21: qty 2
  Filled 2022-06-21: qty 12.5
  Filled 2022-06-21: qty 2
  Filled 2022-06-21 (×2): qty 12.5

## 2022-06-21 MED ORDER — LACTATED RINGERS IV BOLUS
1000.0000 mL | Freq: Once | INTRAVENOUS | Status: AC
  Administered 2022-06-21: 1000 mL via INTRAVENOUS

## 2022-06-21 MED ORDER — APIXABAN 5 MG PO TABS
5.0000 mg | ORAL_TABLET | Freq: Two times a day (BID) | ORAL | Status: DC
  Administered 2022-06-21 – 2022-06-28 (×14): 5 mg via ORAL
  Filled 2022-06-21 (×14): qty 1

## 2022-06-21 MED ORDER — SULFAMETHOXAZOLE-TRIMETHOPRIM 800-160 MG PO TABS
1.0000 | ORAL_TABLET | ORAL | Status: DC
  Administered 2022-06-21 – 2022-06-24 (×2): 1 via ORAL
  Filled 2022-06-21 (×3): qty 1

## 2022-06-21 MED ORDER — LACTATED RINGERS IV SOLN: INTRAVENOUS | Status: DC

## 2022-06-21 MED ORDER — MYCOPHENOLATE SODIUM 180 MG PO TBEC
360.0000 mg | DELAYED_RELEASE_TABLET | Freq: Two times a day (BID) | ORAL | Status: DC
  Administered 2022-06-21 – 2022-06-28 (×14): 360 mg via ORAL
  Filled 2022-06-21 (×14): qty 2

## 2022-06-21 MED ORDER — VANCOMYCIN HCL IN DEXTROSE 1-5 GM/200ML-% IV SOLN
1000.0000 mg | Freq: Once | INTRAVENOUS | Status: AC
  Administered 2022-06-21: 1000 mg via INTRAVENOUS
  Filled 2022-06-21: qty 200

## 2022-06-21 MED ORDER — SODIUM CHLORIDE 0.9 % IV SOLN
2.0000 g | Freq: Once | INTRAVENOUS | Status: AC
  Administered 2022-06-21: 2 g via INTRAVENOUS
  Filled 2022-06-21: qty 12.5

## 2022-06-21 MED ORDER — ACETAMINOPHEN 500 MG PO TABS
1000.0000 mg | ORAL_TABLET | Freq: Once | ORAL | Status: AC
  Administered 2022-06-21: 1000 mg via ORAL
  Filled 2022-06-21: qty 2

## 2022-06-21 MED ORDER — INSULIN GLARGINE-YFGN 100 UNIT/ML ~~LOC~~ SOPN: 35.0000 [IU] | PEN_INJECTOR | SUBCUTANEOUS | Status: DC

## 2022-06-21 MED ORDER — INSULIN ASPART 100 UNIT/ML IJ SOLN
0.0000 [IU] | INTRAMUSCULAR | Status: DC
  Administered 2022-06-21 (×3): 2 [IU] via SUBCUTANEOUS
  Administered 2022-06-22 (×2): 3 [IU] via SUBCUTANEOUS
  Administered 2022-06-22: 2 [IU] via SUBCUTANEOUS
  Administered 2022-06-22: 3 [IU] via SUBCUTANEOUS
  Administered 2022-06-23: 15 [IU] via SUBCUTANEOUS
  Administered 2022-06-23: 8 [IU] via SUBCUTANEOUS
  Administered 2022-06-23 – 2022-06-24 (×2): 5 [IU] via SUBCUTANEOUS
  Administered 2022-06-24 (×2): 3 [IU] via SUBCUTANEOUS
  Administered 2022-06-24: 2 [IU] via SUBCUTANEOUS
  Administered 2022-06-24: 8 [IU] via SUBCUTANEOUS
  Administered 2022-06-24: 5 [IU] via SUBCUTANEOUS
  Administered 2022-06-25: 11 [IU] via SUBCUTANEOUS
  Administered 2022-06-25: 3 [IU] via SUBCUTANEOUS
  Administered 2022-06-25: 15 [IU] via SUBCUTANEOUS
  Administered 2022-06-25: 11 [IU] via SUBCUTANEOUS
  Administered 2022-06-26 (×2): 8 [IU] via SUBCUTANEOUS
  Administered 2022-06-26 (×2): 3 [IU] via SUBCUTANEOUS
  Administered 2022-06-26 – 2022-06-27 (×2): 5 [IU] via SUBCUTANEOUS
  Administered 2022-06-27: 15 [IU] via SUBCUTANEOUS
  Administered 2022-06-27: 8 [IU] via SUBCUTANEOUS
  Administered 2022-06-27: 3 [IU] via SUBCUTANEOUS
  Administered 2022-06-27: 5 [IU] via SUBCUTANEOUS
  Administered 2022-06-27: 11 [IU] via SUBCUTANEOUS
  Administered 2022-06-27: 3 [IU] via SUBCUTANEOUS
  Administered 2022-06-28: 8 [IU] via SUBCUTANEOUS
  Administered 2022-06-28: 2 [IU] via SUBCUTANEOUS
  Filled 2022-06-21 (×35): qty 1

## 2022-06-21 MED ORDER — METRONIDAZOLE 500 MG/100ML IV SOLN
500.0000 mg | Freq: Once | INTRAVENOUS | Status: DC
  Administered 2022-06-21: 500 mg via INTRAVENOUS
  Filled 2022-06-21: qty 100

## 2022-06-21 MED ORDER — BECLOMETHASONE DIPROP HFA 80 MCG/ACT IN AERB: 2.0000 | INHALATION_SPRAY | Freq: Every day | RESPIRATORY_TRACT | Status: DC | PRN

## 2022-06-21 MED ORDER — BUDESONIDE 0.25 MG/2ML IN SUSP: 0.2500 mg | Freq: Every day | RESPIRATORY_TRACT | Status: DC | PRN

## 2022-06-21 NOTE — ED Notes (Signed)
C/o feeling woozy and :out of it".  BP initially 75 //, re-cycled BP and BP 88/58.

## 2022-06-21 NOTE — Consult Note (Signed)
Cardiology Consultation:   Patient ID: [name].; IU:[id number]; [birth year]   Admit date: 06/21/2022 Date of Consult: 06/21/2022  Primary Care Provider: [name], MD Primary Cardiologist: Fletcher Anon Primary Electrophysiologist:  None Advanced Heart Failure: Aundra Dubin   Patient Profile:   [name]. is a 62 y.o. male with a hx of CAD status post CABG in 2005, permanent A-fib on apixaban, ESRD status post renal transplant in 2012 with subsequent CKD stage IIIb, HFmrEF, pulmonary hypertension, HTN, HLD, and obesity s/p gastric bypass who is being seen today for the evaluation of cardiomyopathy at the request of Dr. Ernestina Patches.  History of Present Illness:   [name] underwent stress test on 04/10/2018 showed no significant ischemia, small region of fixed perfusion defect in the inferoapical and apical septal region possibly attenuation artifact, EF 47%, low risk study.     He had episodes of intermittent episodes of extreme dizziness with heart rate in the high 40s.  No syncope.  He had a ZIO Patch monitor done which showed no significant bradycardic events except during sleep time when his heart rate was in 30s.  Overall, there was no indication for a pacemaker.   Most recent ischemic evaluation via stress testing in 2022 was low risk with no evidence of ischemia.   More recently, he has been followed by the advanced heart failure service.   He was admitted to the hospital in 03/2022 with acute on chronic CHF.  Echo showed an EF of 40 to 45%, moderately reduced RV systolic function, RVSP 49 mmHg.  RHC showed elevated filling pressures, moderate pulmonary venous hypertension and preserved cardiac output.  Tadalafil was discontinued with primary pulmonary hypertension group 2 and 3.  He was diuresed with IV Lasix and metolazone with GDMT being resumed.  He is no longer on ACE inhibitor or spironolactone secondary to renal dysfunction.  Not on beta-blocker secondary to  underlying bradycardia.  He was last seen by the advanced heart failure service on 05/30/2022 for follow-up after decreasing of diuretics secondary to renal dysfunction.  He was felt to be volume up with recommendation to increase outpatient diuretic regimen.   Patient was admitted to [facility] on 06/21/2022 following a 40 history of generalized malaise, fevers, chills, and bodyaches.  He was subsequently found to have pyelonephritis in the context of transplanted kidney.  Tmax in the ER 101.5.  Heart rate in the 70s to 120s bpm.  BP has ranged from 88/58 to 143/84 with a current BP of 108/66.  Oxygen saturation 96% on 2 L via nasal cannula.  Labs notable for BNP 465 which is appears to be his baseline.  High-sensitivity troponin 97.  WBC 10.9, Hgb 12.3, PLT 142, BUN 63, serum creatinine 2.18, lactic acid 2.1 trending to 1.8., COVID, influenza, and RSV negative.  Chest x-ray with old granulomatous disease with multiple small nodular foci in the left midlung more prominent than on prior exam.  CT of the head showed no acute intracranial abnormality with chronic small vessel ischemic disease.  CT of the chest/abdomen/pelvis showed no acute findings within the chest, no right transplanted kidney without evidence of hydronephrosis of the upper pole collecting system with mild hydronephrosis in the inferior moiety along with mild fullness of the lower pole ureter without obstructing stone or mass lesion, enlargement of the pulmonary outflow tract suggestive of pulmonary arterial hypertension, stable right lower lobe pulmonary nodule, subtle changes within the liver suggesting cirrhosis and mild splenomegaly as well as aortic atherosclerosis.  The patient has been admitted by the hospitalist service for pyelonephritis and a transplanted kidney and initiated on antibiotic therapy.  Cardiology is asked to consult given patient's complex cardiac history.  The patient is without symptoms of angina or cardiac decompensation.   Internal medicine has held PTA torsemide and [place].  In the ED, BP was soft at times that has subsequently improved following 2 L of LR.  He is currently on LR at 150 cc/hr. he is without symptoms of angina or cardiac decompensation.  No significant lower extremity swelling.  No current appetite.     Past Medical History:  Diagnosis Date   Anal condyloma    Anemia    Aortic atherosclerosis ([place])    Aortic stenosis    a.) TTE 10/2021: Mod AS. AVA 1.06cm^2 (VTI). Mean grad 14mmHg; b.) TTE 04/02/2022: no AV stenosis   Asthma, persistent controlled 02/25/2013   Attention or concentration deficit 04/26/2021   BPH with obstruction/lower urinary tract symptoms 09/25/2014   CAD (coronary artery disease) 2005   a.) LHC 1996 -> HG stenosis mLAD -> PTCA/PCI with stent x 1 (unk type) -> complicated by CFA pseudoanurysm (required surg);  b.) LHC 08/10/2002  -> 95% LAD, 70% ISR LAD, 80% pOM, 70% mRCA --> CVTS consult; c.) 4v CABG 08/13/2002; c.) 03/2018 MV: Small, fixed inferoapical and apical sep defect. No isc. EF 47% (60-65% by 05/2018 TTE); d.) 02/2021 MV: small Apical inf fixed defect. No isc -> low risk.   Cardiomegaly    Cellulitis and abscess of left leg 07/22/2020   Chronic atrial fibrillation    a.) CHA2DS2VASc = 4 (CHF, HTN, vascular disease history, T2DM);  b.) rate/rhythm maintained without pharmacological intervention; chronically anticoagulated with apixaban   Chronic combined systolic and diastolic CHF (congestive heart failure)    a.) TTE 7/18 : EF 45-50%; b.) TTE 05/2018 : EF 60-65%, RVSP 74.8; c.) TTE 12/2018: EF 50-55%. Sev dil LA; d.) TTE 04/2019: EF 45-50%; e.) TTE 07/2021: EF 40-45%, G3DD; f.) TTE 10/2021: EF 40-45%, mild conc LVH, septal-lat dyssynchrony (LBBB), mildly red RVSF, mild-mod dil LA, mildly dil RA, mild-mod MS, mod AS; g.) TTE 04/02/2022: EF 40-45%, glob HK, LVH, red RVSF, RVE, sev LAE, mild-mod RAE, mild MR   Chronic venous stasis dermatitis of both lower extremities     Cytomegaloviral disease 2017   Diverticulosis of colon 04/22/2013   Elevated RIGHT hemidiaphragm    Erectile dysfunction    a.) on topical TRT + Trimix (alprostadil/papaverine/phentolamine) injections   ESRD (end stage renal disease) ([place])    a.) dialysis dependent 2004-2012; b.) s/p cadaveric RIGHT renal transplant 09/13/2010   Essential hypertension 12/17/2010   GERD (gastroesophageal reflux disease)    Gout    History of 2019 novel coronavirus disease (COVID-19) 06/30/2019   History of bilateral cataract extraction 10/2017   History of renal transplant 09/13/2010   a.) s/p cadaveric donor transplant 09/13/2010   Hx of bilateral cataract extraction 10/2017   Hyperlipidemia    Hypogonadism in male 09/25/2014   Ischemic cardiomyopathy    Left cephalic vein thrombosis [id number]   Long term current use of anticoagulant    a.) apixaban   Long term current use of immunosuppressive drug    a.) mycophenolate + prednisone + tacrolimus   Major depressive disorder 10/04/2013   Mitral stenosis 11/07/2021   a.) TTE 11/07/2021: severe MAC, mild-mod MS (MPG 6 mmHg); b.) TTE 04/02/2022: no MV stenosis   Murmur    Obesity hypoventilation syndrome 03/31/2012  OSA treated with BiPAP 09/29/2012   PAH (pulmonary artery hypertension)    a. 04/2019 RHC: RA 19, RV 80/20, PA 78/31 (51), PCWP 25, CO/CI 7.69/3.26. PVR 3.25 --> Sev PAH, likely primarily PV HTN; b.) RHC 04/04/2022: mRA 13, mPA 40, mPCWP 20, PA sat 59, AO sat 92, CO 7.63, CI 3.26, PVR 2.6 --> mod portal venous HTN   Pancreatitis    S/P CABG x 4 08/13/2002   a.) LIMA-LAD, LRA-OM, SVG-D1, SVG-dRCA   S/P gastric bypass    Synovitis of finger 06/11/2019   Trigger finger, unspecified little finger 12/23/2018   Type 2 diabetes mellitus with hyperglycemia, with long-term current use of insulin 09/09/2014   a.) has FreeStyle Libre CGM   Type 2 MI (myocardial infarction) ([place])    Ulcer 05/2016   Left shin   Wears glasses     Past Surgical  History:  Procedure Laterality Date   CARDIAC CATHETERIZATION N/A 08/10/2002   CATARACT EXTRACTION W/PHACO Right 10/29/2017   Procedure: CATARACT EXTRACTION PHACO AND INTRAOCULAR LENS PLACEMENT ([place])  RIGHT DIABETIC;  Surgeon: [name], MD;  Location: [place];  Service: Ophthalmology;  Laterality: Right   CATARACT EXTRACTION W/PHACO Left 11/18/2017   Procedure: CATARACT EXTRACTION PHACO AND INTRAOCULAR LENS PLACEMENT ([place]) LEFT IVA/TOPICAL;  Surgeon: [name], MD;  Location: [place];  Service: Ophthalmology;  Laterality: Left   COLONOSCOPY WITH PROPOFOL N/A 04/22/2013   Procedure: COLONOSCOPY WITH PROPOFOL;  Surgeon: [name], MD;  Location: [facility];  Service: Endoscopy;  Laterality: N/A;   CORONARY ANGIOPLASTY WITH STENT PLACEMENT N/A 1996   CORONARY ARTERY BYPASS GRAFT N/A 08/13/2002   Procedure: 4v CORONARY ARTERY BYPASS GRAFTING; Location: [name]; Surgeon: [name], MD   CYSTOSCOPY WITH INSERTION OF UROLIFT N/A 12/25/2021   Procedure: CYSTOSCOPY WITH INSERTION OF UROLIFT;  Surgeon: [name], MD;  Location: [facility];  Service: Urology;  Laterality: N/A;   DG ANGIO AV SHUNT*L*     right and left upper arms   FASCIOTOMY  03/03/2012   Procedure: FASCIOTOMY;  Surgeon: [name], MD;  Location: [place];  Service: Orthopedics;  Laterality: Right;  FASCIOTOMY RIGHT SMALL FINGER   FASCIOTOMY Left 08/17/2013   Procedure: FASCIOTOMY LEFT RING;  Surgeon: [name], MD;  Location: [place];  Service: Orthopedics;  Laterality: Left;   INCISION AND DRAINAGE ABSCESS Left 10/15/2015   Procedure: INCISION AND DRAINAGE ABSCESS;  Surgeon: [name], MD;  Location: [facility];  Service: General;  Laterality: Left;   KIDNEY TRANSPLANT  09/13/2010   cadaver--at [facility] HEART CATH N/A 11/15/2016   Procedure: RIGHT HEART CATH;  Surgeon: [name], MD;  Location: [place] [facility];  Service: Cardiovascular;  Laterality: N/A;   RIGHT HEART CATH N/A 05/03/2019   Procedure: RIGHT HEART CATH;  Surgeon: [name], MD;  Location: [place] [facility];  Service: Cardiovascular;  Laterality: N/A;   RIGHT HEART CATH N/A 04/04/2022   Procedure: RIGHT HEART CATH;  Surgeon: [name], MD;  Location: [place] [facility];  Service: Cardiovascular;  Laterality: N/A;   ROUX-EN-Y GASTRIC BYPASS N/A 2015   TYMPANIC MEMBRANE REPAIR Left 03/2010   VASECTOMY     WART FULGURATION N/A 05/31/2022   Procedure: FULGURATION ANAL WART;  Surgeon: Benjamine Sprague, DO;  Location: [facility];  Service: General;  Laterality: N/A;     Home Meds: Prior to Admission medications   Medication Sig  Start Date End Date Taking? Authorizing Provider  acetaminophen (TYLENOL) 325 MG tablet Take 2 tablets (650 mg total) by mouth every 8 (eight) hours as needed for mild pain. 05/31/22 06/30/22  Lysle Pearl, Isami, DO  albuterol (PROVENTIL) (2.5 MG/3ML) 0.083% nebulizer solution Take 3 mLs (2.5 mg total) by nebulization every 6 (six) hours as needed for wheezing or shortness of breath. 01/02/21   Michela Pitcher, NP  albuterol (VENTOLIN HFA) 108 (90 Base) MCG/ACT inhaler Inhale 2 puffs into the lungs every 6 (six) hours as needed for wheezing or shortness of breath. 05/30/22   Baird Lyons D, MD  allopurinol (ZYLOPRIM) 100 MG tablet Take 100 mg by mouth every morning.    [provider]  AMBULATORY NON FORMULARY MEDICATION Trimix (30/1/10)-(Pap/Phent/PGE)  Test Dose  36ml vial   Qty #[address] [place] [phone] Fax [phone] 04/30/22   Zara Council A, PA-C  apixaban (ELIQUIS) 5 MG TABS tablet TAKE 1 TABLET BY MOUTH TWICE DAILY Patient taking differently: Take 5 mg by mouth 2 (two) times daily. 03/15/22 03/15/23  [name], MD  beclomethasone (QVAR) 80 MCG/ACT inhaler Inhale 2 puffs into the lungs daily as needed (for shortness of breath).    [provider]   buPROPion (WELLBUTRIN SR) 150 MG 12 hr tablet Take 1 tablet (150 mg total) by mouth 2 (two) times daily. 06/17/22 06/17/23  [name], MD  cholecalciferol (VITAMIN D3) 25 MCG (1000 UNIT) tablet Take 1,000 Units by mouth at bedtime.    [provider]  colchicine 0.6 MG tablet Take 1 tablet (0.6 mg total) by mouth every other day 05/31/22   Benjamine Sprague, DO  colchicine 0.6 MG tablet Take 1 tablet (0.6 mg total) by mouth every other day. 06/17/22     Continuous Blood Gluc Sensor (FREESTYLE LIBRE 3 SENSOR) MISC Use one every 2 weeks. 06/13/22     dapagliflozin propanediol (FARXIGA) 10 MG TABS tablet Take 1 tablet (10 mg total) by mouth daily before breakfast. 12/04/21   Milford, Maricela Bo, FNP  febuxostat (ULORIC) 40 MG tablet Take 1 tablet (40 mg total) by mouth daily. 01/22/22     fluticasone (FLONASE) 50 MCG/ACT nasal spray PLACE 2 SPRAYS INTO BOTH NOSTRILS DAILY. 08/03/21 08/03/22  [name], MD  gabapentin (NEURONTIN) 300 MG capsule Take 1 capsule (300 mg total) by mouth 2 (two) times daily. 12/04/21     hydrALAZINE (APRESOLINE) 25 MG tablet Take 1 tablet (25 mg total) by mouth every 8 (eight) hours. 04/06/22     hydrocortisone 2.5 % cream Apply topically 3 (three) times daily as needed. 01/21/22   [name], MD  insulin glargine-yfgn (SEMGLEE) 100 UNIT/ML Pen Inject 35 Units into the skin daily. Patient taking differently: Inject 35 Units into the skin at bedtime. 01/22/22     insulin lispro (HUMALOG KWIKPEN) 100 UNIT/ML KwikPen Inject 10-20 Units into the skin 3 (three) times daily. Per sliding scale 05/31/22   Lysle Pearl, Isami, DO  Insulin Pen Needle (UNIFINE PENTIPS) 31G X 5 MM MISC Use 4 times daily as directed 05/28/21     iron polysaccharides (FERREX 150) 150 MG capsule Take 1 capsule (150 mg total) by mouth daily. 05/31/22   Lysle Pearl, Isami, DO  isosorbide mononitrate (IMDUR) 30 MG 24 hr tablet Take 1 tablet (30 mg total) by mouth daily. 04/06/22   Sande Rives E, PA-C   lidocaine (LIDODERM) 5 % Place 1 patch onto the skin daily.PLACE 1 PATCH ONTO THE SKIN  FOR 12 (TWELVE) HOURS. REMOVE & DISCARD PATCH WITHIN 12 HOURS OR AS DIRECTED BY MD 04/29/22 04/29/23  [name] I, MD  lidocaine (XYLOCAINE) 5 % ointment Apply 1 Application topically 3 (three) times daily as needed. Apply pea size amount to area as needed for pain. 05/31/22   Lysle Pearl, Isami, DO  metolazone (ZAROXOLYN) 2.5 MG tablet Take 1 tablet by mouth 3 times weekly. 05/30/22   [name], MD  montelukast (SINGULAIR) 10 MG tablet TAKE 1 TABLET BY MOUTH AT BEDTIME. Patient taking differently: Take 10 mg by mouth at bedtime. 12/04/21 12/04/22  Martyn Ehrich, NP  Multiple Vitamin (MULTIVITAMIN WITH MINERALS) TABS tablet Take 1 tablet by mouth daily.    [provider]  mycophenolate (MYFORTIC) 180 MG EC tablet Take 2 tablets (360 mg total) by mouth 2 (two) times daily. 04/04/22     omeprazole (PRILOSEC) 20 MG capsule TAKE 1 CAPSULE BY MOUTH DAILY. Patient taking differently: Take 20 mg by mouth at bedtime. 01/07/22 01/07/23  [name], MD  potassium chloride (KLOR-CON) 10 MEQ tablet Take 6 tablets (60 mEq total) by mouth 2 (two) times daily. 04/17/22 01/12/23  Rafael Bihari, FNP  predniSONE (DELTASONE) 5 MG tablet Take 1 tablet (5 mg total) by mouth daily. 03/01/22     rosuvastatin (CRESTOR) 10 MG tablet Take 1 tablet (10 mg total) by mouth daily. Patient taking differently: Take 10 mg by mouth at bedtime. 07/25/21   [name], MD  sulfamethoxazole-trimethoprim (BACTRIM) 400-80 MG tablet TAKE 1 TABLET BY MOUTH EVERY MONDAY, [place], FRIDAY. Patient taking differently: Take 1 tablet by mouth See admin instructions. Take one tablet by mouth on Monday, Wednesday and Fridays -AM 09/21/21     tacrolimus (PROGRAF) 1 MG capsule Take 2 capsules (2 mg total) by mouth 2 times daily. 03/01/22     tamsulosin (FLOMAX) 0.4 MG CAPS capsule TAKE 1 CAPSULE BY MOUTH DAILY. Patient taking differently:  Take 0.4 mg by mouth daily after breakfast. 05/28/21     Testosterone 10 MG/ACT (2%) GEL Apply 1 Pump topically daily to inner thighs - 2 pumps total. 05/30/22   McGowan, Larene Beach A, PA-C  torsemide (DEMADEX) 20 MG tablet Take 5 tablets (100 mg total) by mouth 2 (two) times daily. 05/30/22   [name], MD  zinc sulfate 220 (50 Zn) MG capsule Take 220 mg by mouth daily.    [provider]    Inpatient Medications: Scheduled Meds:  apixaban  5 mg Oral BID   buPROPion  150 mg Oral BID   insulin aspart  0-15 Units Subcutaneous Q4H   mycophenolate  360 mg Oral BID   predniSONE  5 mg Oral Daily   Continuous Infusions:  ceFEPime (MAXIPIME) IV     lactated ringers 150 mL/hr (06/21/22 1215)   PRN Meds: budesonide (PULMICORT) nebulizer solution, ondansetron **OR** ondansetron (ZOFRAN) IV  Allergies:   Allergies  Allergen Reactions   Contrast Media [Iodinated Contrast Media] Other (See Comments)    Kidney transplant   Iodine Other (See Comments)    Other reaction(s): Other (See Comments) Kidney transplant   Ibuprofen Other (See Comments)    Due to kidney transplant Due to kidney transplant Due to kidney transplant    Social History:   Social History   Socioeconomic History   Marital status: Married    Spouse name: Not on file   Number of children: 2   Years of education: 14   Highest education level: Associate degree: academic  program  Occupational History   Occupation: Warden/ranger: unemployed    Comment: disabled due to kidney failure  Tobacco Use   Smoking status: Former    Types: Cigars    Quit date: 09/02/1994    Years since quitting: 27.8    Passive exposure: Never   Smokeless tobacco: Never   Tobacco comments:    Occasional, rare cigars  Vaping Use   Vaping Use: Never used  Substance and Sexual Activity   Alcohol use: Not Currently    Comment: occ   Drug use: No   Sexual activity: Not on file  Other Topics Concern   Not  on file  Social History Narrative   Has living will   Wife is health care POA   Would accept resuscitation attempts   No tube feedings if cognitively unaware   Social Determinants of Health   Financial Resource Strain: Not on file  Food Insecurity: No Food Insecurity (04/01/2022)   Hunger Vital Sign    Worried About Running Out of Food in the Last Year: Never true    Ran Out of Food in the Last Year: Never true  Transportation Needs: No Transportation Needs (04/01/2022)   PRAPARE - Hydrologist (Medical): No    Lack of Transportation (Non-Medical): No  Physical Activity: Not on file  Stress: Not on file  Social Connections: Not on file  Intimate Partner Violence: Not At Risk (04/01/2022)   Humiliation, Afraid, Rape, and Kick questionnaire    Fear of Current or Ex-Partner: No    Emotionally Abused: No    Physically Abused: No    Sexually Abused: No     Family History:   Family History  Problem Relation Age of Onset   Heart disease Father    Kidney failure Father    Kidney disease Father    Diabetes Maternal Grandmother    Breast cancer Maternal Grandmother    Valvular heart disease Mother    Liver cancer Paternal Uncle    Liver cancer Paternal Grandmother    Prostate cancer Neg Hx     ROS:  12-point review of systems is negative unless otherwise noted in the HPI.  Physical Exam/Data:   Vitals:   06/21/22 1203 06/21/22 1215 06/21/22 1237 06/21/22 1300  BP: (!) 88/58 94/67  108/66  Pulse: 89 79  (!) 101  Resp: (!) 26 18  16   Temp:   98.2 F (36.8 C)   TempSrc:   Oral   SpO2: 96% 98%  100%  Weight:      Height:        Intake/Output Summary (Last 24 hours) at 06/21/2022 1549 Last data filed at 06/21/2022 1111 Gross per 24 hour  Intake --  Output 100 ml  Net -100 ml   Filed Weights   06/21/22 0840  Weight: 124.3 kg   Body mass index is 39.31 kg/m.   Physical Exam: General: Well developed, well nourished, in no acute  distress. Head: Normocephalic, atraumatic, sclera non-icteric, no xanthomas, nares without discharge.  Neck: Negative for carotid bruits. JVD not elevated. Lungs: Clear bilaterally to auscultation without wheezes, rales, or rhonchi. Breathing is unlabored. Heart: IRIR with S1 S2. No murmurs, rubs, or gallops appreciated. Abdomen: Soft, non-tender, non-distended with normoactive bowel sounds. No hepatomegaly. No rebound/guarding. No obvious abdominal masses. Msk:  Strength and tone appear normal for age. Extremities: No clubbing or cyanosis.  Hyperpigmentation noted consistent with chronic woody edema, currently without  lower extremity swelling. Distal pedal pulses are 2+ and equal bilaterally. Neuro: Alert and oriented X 3. No facial asymmetry. No focal deficit. Moves all extremities spontaneously. Psych:  Responds to questions appropriately with a normal affect.   EKG:  The EKG was personally reviewed and demonstrates: A-fib with RVR, 125 bpm, IVCD Telemetry:  Telemetry was personally reviewed and demonstrates: A-fib with ventricular rates in the 90s to the 100s bpm, occasional isolated PVCs  Weights: Filed Weights   06/21/22 0840  Weight: 124.3 kg    Relevant CV Studies: As above  Laboratory Data:  Chemistry Recent Labs  Lab 06/21/22 0830  NA 140  K 4.0  CL 98  CO2 32  GLUCOSE 93  BUN 63*  CREATININE 2.18*  CALCIUM 9.1  GFRNONAA 33*  ANIONGAP 10    Recent Labs  Lab 06/21/22 0830  PROT 7.1  ALBUMIN 3.4*  AST 25  ALT 14  ALKPHOS 114  BILITOT 1.3*   Hematology Recent Labs  Lab 06/21/22 0830  WBC 10.9*  RBC 5.35  HGB 12.3*  HCT 41.9  MCV 78.3*  MCH 23.0*  MCHC 29.4*  RDW 19.9*  PLT 142*   Cardiac EnzymesNo results for input(s): "TROPONINI" in the last 168 hours. No results for input(s): "TROPIPOC" in the last 168 hours.  BNP Recent Labs  Lab 06/21/22 0848  BNP 465.0*    DDimer No results for input(s): "DDIMER" in the last 168  hours.  Radiology/Studies:  CT CHEST ABDOMEN PELVIS WO CONTRAST  Result Date: 06/21/2022 IMPRESSION: 1. No acute findings in the chest. 2. 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 No hydronephrosis in the upper pole collecting system although inferior moiety demonstrates mild hydronephrosis, new in the interval with mild fullness of the lower pole ureter to the level of the ureteral confluence. No obstructing stone or mass lesion evident. 3. Enlargement of the pulmonary outflow tract/main pulmonary arteries suggests pulmonary arterial hypertension. 4. Stable 2.9 x 1.0 cm nodular consolidative opacity in the right lower lobe since chest CT 07/01/2019 consistent with benign etiology, potentially scarring. 5. Subtle changes in the liver suggesting cirrhosis and stable mild splenomegaly would be compatible with portal venous hypertension. 6.  Aortic Atherosclerosis (ICD10-I70.0). Electronically Signed   By: Misty Stanley M.D.   On: 06/21/2022 10:01   CT HEAD WO CONTRAST (5MM)  Result Date: 06/21/2022 IMPRESSION: 1. No acute intracranial abnormality. 2. Chronic small vessel ischemic disease. 3. Stable trace inferior left mastoid effusion. Electronically Signed   By: Misty Stanley M.D.   On: 06/21/2022 09:36   DG Chest Portable 1 View  Result Date: 06/21/2022 IMPRESSION: Enlargement of cardiac silhouette post CABG. Old granulomatous disease with multiple small nodular foci in the LEFT mid lung, more prominent than on previous exam; potentially this could represent a an atypical infiltrate in a transplant patient but CT chest recommended to exclude developing pulmonary nodules. Electronically Signed   By: Lavonia Dana M.D.   On: 06/21/2022 09:03    Assessment and Plan:   HFmrEF:  -Appears euvolemic and well compensated  -No longer on beta-blocker secondary to bradycardia  -No longer on ARB or spironolactone secondary to renal  dysfunction  -Farxiga and torsemide held upon admission given acute pyelonephritis with renal dysfunction, resume as able  -Decrease LR to 75 mL/hr as he appears appropriately volume resuscitated in an effort to minimize volume overload  Permanent A-fib:  -Controlled ventricular response  -Not on beta-blocker secondary to bradycardia  -  PTA apixaban, does not meet reduced dosing criteria   CAD status post CABG with mildly elevated high-sensitivity troponin:  -No symptoms suggestive of angina  -Initial high-sensitivity troponin mildly elevated at 97, not cycled  -Nuclear stress test in 2022 with prior MI without evidence of ischemia  -Would have a high threshold for cardiac cath given underlying CKD  -PTA apixaban in place of aspirin  -PTA rosuvastatin   Acute pyelonephritis with CKD stage IIIb status post renal transplantation in 2012:  -Avoid nephrotoxic agents  -Management per primary service   Pulmonary hypertension:  -Primarily pulmonary venous hypertension by RHC in 03/2022 with possible group 3 pulm hypertension from OSA  -No longer on tadalafil  -Reduce IV fluids as above -Outpatient follow-up with advanced heart failure service   HTN:  -Blood pressure improved and well controlled       For questions or updates, please contact [facility] Please consult [email or web address] for contact info under Cardiology/STEMI.   Signed, Christell Faith, PA-C [facility] Pager: [phone] 06/21/2022, 3:49 PM

## 2022-06-21 NOTE — Assessment & Plan Note (Signed)
PPI ?

## 2022-06-21 NOTE — Assessment & Plan Note (Signed)
History of ESRD s/p renal transplant in 2012. Now with CKD stage IIIb. Baseline creatinine around 2.0 to 2.4.  Cr 2.2 today On prednisone, Myfortic and Prograf Continue home medications for now Continue stress dose steroids in the setting of sepsis if BP downtrends Nephrology consult

## 2022-06-21 NOTE — Consult Note (Signed)
Pharmacy Antibiotic Note  [name]. is a 62 y.o. male with PMH including CKD s/p renal transplant, CAD, HTN, HLD, gout, asthma, Afib, BPH with LUTS, combined systolic and diastolic CHF, GERD, obesity, DM, PAH, depression admitted on 06/21/2022 with  pyelonephritis . Code sepsis activated in the ED. Pharmacy has been consulted for cefepime dosing.  Plan:  Cefepime 2 g IV q12h  Follow-up cultures, clinical course, duration (patient is immunocompromised status)  Height: 5\' 10"  (177.8 cm) Weight: 124.3 kg (274 lb) IBW/kg (Calculated) : 73  Temp (24hrs), Avg:99.8 F (37.7 C), Min:98.1 F (36.7 C), Max:101.5 F (38.6 C)  Recent Labs  Lab 06/21/22 0830 06/21/22 0839  WBC 10.9*  --   CREATININE 2.18*  --   LATICACIDVEN  --  2.1*    Estimated Creatinine Clearance: 46.5 mL/min (A) (by C-G formula based on SCr of 2.18 mg/dL (H)).    Allergies  Allergen Reactions   Contrast Media [Iodinated Contrast Media] Other (See Comments)    Kidney transplant   Iodine Other (See Comments)    Other reaction(s): Other (See Comments) Kidney transplant   Ibuprofen Other (See Comments)    Due to kidney transplant Due to kidney transplant Due to kidney transplant    Antimicrobials this admission: Vancomycin 3/29 x 1 Metronidazole 3/29 x 1 Cefepime 3/29 >>   Dose adjustments this admission: N/A  Microbiology results: 3/29 BCx: pending 3/29 UCx: pending   Thank you for allowing pharmacy to be a part of this patient's care.  [name] 06/21/2022 11:44 AM

## 2022-06-21 NOTE — Assessment & Plan Note (Addendum)
History of ESRD s/p renal transplant in 2012. Now with CKD stage IIIb. Baseline creatinine around 2.0 to 2.4.  Cr 2.2 today On prednisone, Myfortic and Prograf Continue home medications for now Continue stress dose steroids in the setting of sepsis if BP downtrends

## 2022-06-21 NOTE — Assessment & Plan Note (Signed)
Fevers malaise chills x 4 days with new onset hematuria over the past 24 hours Urinalysis and imaging indicative of pyelonephritis of transplanted kidney with active sepsis Baseline history of end-stage renal disease status post renal transplant on chronic immunosuppression IV cefepime for urinary coverage Urine culture Follow

## 2022-06-21 NOTE — Assessment & Plan Note (Signed)
BP 100s/50s in setting of sepsis  Hold BP regimen for now

## 2022-06-21 NOTE — Assessment & Plan Note (Signed)
Primarily venous hypertension by [facility] 03/2021 admission  On imdur  Hold for now given LLN BP pending cardiology eval

## 2022-06-21 NOTE — ED Notes (Signed)
Report given to ICU nurse. Pt and family updated

## 2022-06-21 NOTE — H&P (Addendum)
History and Physical    Patient: [name]. KG:[id number] DOB: [birth year] DOA: 06/21/2022 DOS: the patient was seen and examined on 06/21/2022 PCP: [name], MD  Patient coming from: Home  Chief Complaint:  Chief Complaint  Patient presents with   Shortness of Breath   Fever   Hematuria   HPI: [name]. is a 62 y.o. male with medical history significant of multiple medical issues including history of coronary artery disease status post CABG in 2005, ischemic cardiomyopathy with a EF of 40 to 45% based on echo January 2024, pulmonary hypertension, chronic atrial fibrillation on Eliquis, aortic stenosis, hypertension, type 2 diabetes, end-stage renal disease status post renal transplant in 2012 now with stage III CKD, OSA on CPAP, gout presenting with sepsis, pyelonephritis.  Patient with reported 4 days of generalized malaise, fevers chills and bodyaches.  Minimal cough chest pain or wheezing.  Symptoms progressively worsened over this timeframe with noted new onset hematuria over the past 24 hours.  Noted admission back in January for decompensated heart failure.  Was aggressively diuresed during this admission.  No recent medication changes from January per report.  No reported flank pain. Presented to the ER Tmax of 101.5, heart rate in the 100s, BP 80s to 100s over 50s, on 2 L nasal cannula to keep O2 sats greater than 92%.  White count 10.9, hemoglobin 12.3, platelets 142, lactate 2.1-1.8.  Troponin 90s to 120s, COVID flu and RSV negative.  BNP 465.  Creatinine 2.2.  CT head grossly stable.  CT chest abdomen pelvis with noted hydronephrosis of transplanted kidney.  Noted 2.9 x 1 cm nodular Consolidative opacity in the right lower lobe present since 2021.  Changes in liver consistent with cirrhosis splenomegaly with some concern for portal vein hypertension. Review of Systems: As mentioned in the history of present illness. All other systems reviewed and are  negative. Past Medical History:  Diagnosis Date   Anal condyloma    Anemia    Aortic atherosclerosis ([place])    Aortic stenosis    a.) TTE 10/2021: Mod AS. AVA 1.06cm^2 (VTI). Mean grad 78mmHg; b.) TTE 04/02/2022: no AV stenosis   Asthma, persistent controlled 02/25/2013   Attention or concentration deficit 04/26/2021   BPH with obstruction/lower urinary tract symptoms 09/25/2014   CAD (coronary artery disease) 2005   a.) LHC 1996 -> HG stenosis mLAD -> PTCA/PCI with stent x 1 (unk type) -> complicated by CFA pseudoanurysm (required surg);  b.) LHC 08/10/2002  -> 95% LAD, 70% ISR LAD, 80% pOM, 70% mRCA --> CVTS consult; c.) 4v CABG 08/13/2002; c.) 03/2018 MV: Small, fixed inferoapical and apical sep defect. No isc. EF 47% (60-65% by 05/2018 TTE); d.) 02/2021 MV: small Apical inf fixed defect. No isc -> low risk.   Cardiomegaly    Cellulitis and abscess of left leg 07/22/2020   Chronic atrial fibrillation    a.) CHA2DS2VASc = 4 (CHF, HTN, vascular disease history, T2DM);  b.) rate/rhythm maintained without pharmacological intervention; chronically anticoagulated with apixaban   Chronic combined systolic and diastolic CHF (congestive heart failure)    a.) TTE 7/18 : EF 45-50%; b.) TTE 05/2018 : EF 60-65%, RVSP 74.8; c.) TTE 12/2018: EF 50-55%. Sev dil LA; d.) TTE 04/2019: EF 45-50%; e.) TTE 07/2021: EF 40-45%, G3DD; f.) TTE 10/2021: EF 40-45%, mild conc LVH, septal-lat dyssynchrony (LBBB), mildly red RVSF, mild-mod dil LA, mildly dil RA, mild-mod MS, mod AS; g.) TTE 04/02/2022: EF 40-45%, glob HK, LVH, red  RVSF, RVE, sev LAE, mild-mod RAE, mild MR   Chronic venous stasis dermatitis of both lower extremities    Cytomegaloviral disease 2017   Diverticulosis of colon 04/22/2013   Elevated RIGHT hemidiaphragm    Erectile dysfunction    a.) on topical TRT + Trimix (alprostadil/papaverine/phentolamine) injections   ESRD (end stage renal disease) ([place])    a.) dialysis dependent 2004-2012; b.) s/p cadaveric  RIGHT renal transplant 09/13/2010   Essential hypertension 12/17/2010   GERD (gastroesophageal reflux disease)    Gout    History of 2019 novel coronavirus disease (COVID-19) 06/30/2019   History of bilateral cataract extraction 10/2017   History of renal transplant 09/13/2010   a.) s/p cadaveric donor transplant 09/13/2010   Hx of bilateral cataract extraction 10/2017   Hyperlipidemia    Hypogonadism in male 09/25/2014   Ischemic cardiomyopathy    Left cephalic vein thrombosis [id number]   Long term current use of anticoagulant    a.) apixaban   Long term current use of immunosuppressive drug    a.) mycophenolate + prednisone + tacrolimus   Major depressive disorder 10/04/2013   Mitral stenosis 11/07/2021   a.) TTE 11/07/2021: severe MAC, mild-mod MS (MPG 6 mmHg); b.) TTE 04/02/2022: no MV stenosis   Murmur    Obesity hypoventilation syndrome 03/31/2012   OSA treated with BiPAP 09/29/2012   PAH (pulmonary artery hypertension)    a. 04/2019 RHC: RA 19, RV 80/20, PA 78/31 (51), PCWP 25, CO/CI 7.69/3.26. PVR 3.25 --> Sev PAH, likely primarily PV HTN; b.) RHC 04/04/2022: mRA 13, mPA 40, mPCWP 20, PA sat 59, AO sat 92, CO 7.63, CI 3.26, PVR 2.6 --> mod portal venous HTN   Pancreatitis    S/P CABG x 4 08/13/2002   a.) LIMA-LAD, LRA-OM, SVG-D1, SVG-dRCA   S/P gastric bypass    Synovitis of finger 06/11/2019   Trigger finger, unspecified little finger 12/23/2018   Type 2 diabetes mellitus with hyperglycemia, with long-term current use of insulin 09/09/2014   a.) has FreeStyle Libre CGM   Type 2 MI (myocardial infarction) ([place])    Ulcer 05/2016   Left shin   Wears glasses    Past Surgical History:  Procedure Laterality Date   CARDIAC CATHETERIZATION N/A 08/10/2002   CATARACT EXTRACTION W/PHACO Right 10/29/2017   Procedure: CATARACT EXTRACTION PHACO AND INTRAOCULAR LENS PLACEMENT ([place])  RIGHT DIABETIC;  Surgeon: [name], MD;  Location: [facility];  Service:  Ophthalmology;  Laterality: Right   CATARACT EXTRACTION W/PHACO Left 11/18/2017   Procedure: CATARACT EXTRACTION PHACO AND INTRAOCULAR LENS PLACEMENT ([place]) LEFT IVA/TOPICAL;  Surgeon: [name], MD;  Location: [facility];  Service: Ophthalmology;  Laterality: Left   COLONOSCOPY WITH PROPOFOL N/A 04/22/2013   Procedure: COLONOSCOPY WITH PROPOFOL;  Surgeon: [name], MD;  Location: [facility];  Service: Endoscopy;  Laterality: N/A;   CORONARY ANGIOPLASTY WITH STENT PLACEMENT N/A 1996   CORONARY ARTERY BYPASS GRAFT N/A 08/13/2002   Procedure: 4v CORONARY ARTERY BYPASS GRAFTING; Location: [name]; Surgeon: [name], MD   CYSTOSCOPY WITH INSERTION OF UROLIFT N/A 12/25/2021   Procedure: CYSTOSCOPY WITH INSERTION OF UROLIFT;  Surgeon: [name], MD;  Location: [facility];  Service: Urology;  Laterality: N/A;   DG ANGIO AV SHUNT*L*     right and left upper arms   FASCIOTOMY  03/03/2012   Procedure: FASCIOTOMY;  Surgeon: [name], MD;  Location: [facility];  Service: Orthopedics;  Laterality: Right;  FASCIOTOMY RIGHT SMALL FINGER  FASCIOTOMY Left 08/17/2013   Procedure: FASCIOTOMY LEFT RING;  Surgeon: [name], MD;  Location: [facility];  Service: Orthopedics;  Laterality: Left;   INCISION AND DRAINAGE ABSCESS Left 10/15/2015   Procedure: INCISION AND DRAINAGE ABSCESS;  Surgeon: [name], MD;  Location: [facility];  Service: General;  Laterality: Left;   KIDNEY TRANSPLANT  09/13/2010   cadaver--at [facility] HEART CATH N/A 11/15/2016   Procedure: RIGHT HEART CATH;  Surgeon: [name], MD;  Location: [facility];  Service: Cardiovascular;  Laterality: N/A;   RIGHT HEART CATH N/A 05/03/2019   Procedure: RIGHT HEART CATH;  Surgeon: [name], MD;  Location: [facility];  Service: Cardiovascular;  Laterality: N/A;   RIGHT HEART CATH N/A 04/04/2022   Procedure: RIGHT HEART CATH;   Surgeon: [name], MD;  Location: [facility];  Service: Cardiovascular;  Laterality: N/A;   ROUX-EN-Y GASTRIC BYPASS N/A 2015   TYMPANIC MEMBRANE REPAIR Left 03/2010   VASECTOMY     WART FULGURATION N/A 05/31/2022   Procedure: FULGURATION ANAL WART;  Surgeon: Benjamine Sprague, DO;  Location: [facility];  Service: General;  Laterality: N/A;   Social History:  reports that he quit smoking about 27 years ago. His smoking use included cigars. He has never been exposed to tobacco smoke. He has never used smokeless tobacco. He reports that he does not currently use alcohol. He reports that he does not use drugs.  Allergies  Allergen Reactions   Contrast Media [Iodinated Contrast Media] Other (See Comments)    Kidney transplant   Iodine Other (See Comments)    Other reaction(s): Other (See Comments) Kidney transplant   Ibuprofen Other (See Comments)    Due to kidney transplant Due to kidney transplant Due to kidney transplant    Family History  Problem Relation Age of Onset   Heart disease Father    Kidney failure Father    Kidney disease Father    Diabetes Maternal Grandmother    Breast cancer Maternal Grandmother    Valvular heart disease Mother    Liver cancer Paternal Uncle    Liver cancer Paternal Grandmother    Prostate cancer Neg Hx     Prior to Admission medications   Medication Sig Start Date End Date Taking? Authorizing Provider  acetaminophen (TYLENOL) 325 MG tablet Take 2 tablets (650 mg total) by mouth every 8 (eight) hours as needed for mild pain. 05/31/22 06/30/22  Lysle Pearl, Isami, DO  albuterol (PROVENTIL) (2.5 MG/3ML) 0.083% nebulizer solution Take 3 mLs (2.5 mg total) by nebulization every 6 (six) hours as needed for wheezing or shortness of breath. 01/02/21   Michela Pitcher, NP  albuterol (VENTOLIN HFA) 108 (90 Base) MCG/ACT inhaler Inhale 2 puffs into the lungs every 6 (six) hours as needed for wheezing or shortness of breath. 05/30/22   Baird Lyons D, MD   allopurinol (ZYLOPRIM) 100 MG tablet Take 100 mg by mouth every morning.    [provider]  AMBULATORY NON FORMULARY MEDICATION Trimix (30/1/10)-(Pap/Phent/PGE)  Test Dose  70ml vial   Qty #3 [place] [phone] Fax [phone] 04/30/22   Zara Council A, PA-C  apixaban (ELIQUIS) 5 MG TABS tablet TAKE 1 TABLET BY MOUTH TWICE DAILY Patient taking differently: Take 5 mg by mouth 2 (two) times daily. 03/15/22 03/15/23  [name], MD  beclomethasone (QVAR) 80 MCG/ACT inhaler Inhale 2 puffs into the lungs daily  as needed (for shortness of breath).    [provider]  buPROPion (WELLBUTRIN SR) 150 MG 12 hr tablet Take 1 tablet (150 mg total) by mouth 2 (two) times daily. 06/17/22 06/17/23  [name], MD  cholecalciferol (VITAMIN D3) 25 MCG (1000 UNIT) tablet Take 1,000 Units by mouth at bedtime.    [provider]  colchicine 0.6 MG tablet Take 1 tablet (0.6 mg total) by mouth every other day 05/31/22   Benjamine Sprague, DO  colchicine 0.6 MG tablet Take 1 tablet (0.6 mg total) by mouth every other day. 06/17/22     Continuous Blood Gluc Sensor (FREESTYLE LIBRE 3 SENSOR) MISC Use one every 2 weeks. 06/13/22     dapagliflozin propanediol (FARXIGA) 10 MG TABS tablet Take 1 tablet (10 mg total) by mouth daily before breakfast. 12/04/21   Milford, Maricela Bo, FNP  febuxostat (ULORIC) 40 MG tablet Take 1 tablet (40 mg total) by mouth daily. 01/22/22     fluticasone (FLONASE) 50 MCG/ACT nasal spray PLACE 2 SPRAYS INTO BOTH NOSTRILS DAILY. 08/03/21 08/03/22  [name], MD  gabapentin (NEURONTIN) 300 MG capsule Take 1 capsule (300 mg total) by mouth 2 (two) times daily. 12/04/21     hydrALAZINE (APRESOLINE) 25 MG tablet Take 1 tablet (25 mg total) by mouth every 8 (eight) hours. 04/06/22     hydrocortisone 2.5 % cream Apply topically 3 (three) times daily as needed. 01/21/22   [name], MD  insulin glargine-yfgn (SEMGLEE) 100 UNIT/ML  Pen Inject 35 Units into the skin daily. Patient taking differently: Inject 35 Units into the skin at bedtime. 01/22/22     insulin lispro (HUMALOG KWIKPEN) 100 UNIT/ML KwikPen Inject 10-20 Units into the skin 3 (three) times daily. Per sliding scale 05/31/22   Lysle Pearl, Isami, DO  Insulin Pen Needle (UNIFINE PENTIPS) 31G X 5 MM MISC Use 4 times daily as directed 05/28/21     iron polysaccharides (FERREX 150) 150 MG capsule Take 1 capsule (150 mg total) by mouth daily. 05/31/22   Lysle Pearl, Isami, DO  isosorbide mononitrate (IMDUR) 30 MG 24 hr tablet Take 1 tablet (30 mg total) by mouth daily. 04/06/22   Sande Rives E, PA-C  lidocaine (LIDODERM) 5 % Place 1 patch onto the skin daily.PLACE 1 PATCH ONTO THE SKIN FOR 12 (TWELVE) HOURS. REMOVE & DISCARD PATCH WITHIN 12 HOURS OR AS DIRECTED BY MD 04/29/22 04/29/23  [name] I, MD  lidocaine (XYLOCAINE) 5 % ointment Apply 1 Application topically 3 (three) times daily as needed. Apply pea size amount to area as needed for pain. 05/31/22   Lysle Pearl, Isami, DO  metolazone (ZAROXOLYN) 2.5 MG tablet Take 1 tablet by mouth 3 times weekly. 05/30/22   [name], MD  montelukast (SINGULAIR) 10 MG tablet TAKE 1 TABLET BY MOUTH AT BEDTIME. Patient taking differently: Take 10 mg by mouth at bedtime. 12/04/21 12/04/22  Martyn Ehrich, NP  Multiple Vitamin (MULTIVITAMIN WITH MINERALS) TABS tablet Take 1 tablet by mouth daily.    [provider]  mycophenolate (MYFORTIC) 180 MG EC tablet Take 2 tablets (360 mg total) by mouth 2 (two) times daily. 04/04/22     omeprazole (PRILOSEC) 20 MG capsule TAKE 1 CAPSULE BY MOUTH DAILY. Patient taking differently: Take 20 mg by mouth at bedtime. 01/07/22 01/07/23  [name], MD  potassium chloride (KLOR-CON) 10 MEQ tablet Take 6 tablets (60 mEq total) by mouth 2 (two) times daily. 04/17/22 01/12/23  Rafael Bihari, FNP  predniSONE (DELTASONE) 5 MG tablet Take 1 tablet (5 mg total) by mouth daily. 03/01/22      rosuvastatin (CRESTOR) 10 MG tablet Take 1 tablet (10 mg total) by mouth daily. Patient taking differently: Take 10 mg by mouth at bedtime. 07/25/21   [name], MD  sulfamethoxazole-trimethoprim (BACTRIM) 400-80 MG tablet TAKE 1 TABLET BY MOUTH EVERY MONDAY, Grandfather, FRIDAY. Patient taking differently: Take 1 tablet by mouth See admin instructions. Take one tablet by mouth on Monday, Wednesday and Fridays -AM 09/21/21     tacrolimus (PROGRAF) 1 MG capsule Take 2 capsules (2 mg total) by mouth 2 times daily. 03/01/22     tamsulosin (FLOMAX) 0.4 MG CAPS capsule TAKE 1 CAPSULE BY MOUTH DAILY. Patient taking differently: Take 0.4 mg by mouth daily after breakfast. 05/28/21     Testosterone 10 MG/ACT (2%) GEL Apply 1 Pump topically daily to inner thighs - 2 pumps total. 05/30/22   McGowan, Larene Beach A, PA-C  torsemide (DEMADEX) 20 MG tablet Take 5 tablets (100 mg total) by mouth 2 (two) times daily. 05/30/22   [name], MD  zinc sulfate 220 (50 Zn) MG capsule Take 220 mg by mouth daily.    [provider]    Physical Exam: Vitals:   06/21/22 1030 06/21/22 1112 06/21/22 1130 06/21/22 1203  BP: (!) 88/58  99/71 (!) 88/58  Pulse: (!) 107  (!) 109 89  Resp: (!) 27  13 (!) 26  Temp:  98.1 F (36.7 C)    TempSrc:  Oral    SpO2: 93%  92% 96%  Weight:      Height:       Physical Exam Constitutional:      Appearance: He is obese.  HENT:     Head: Normocephalic and atraumatic.     Mouth/Throat:     Mouth: Mucous membranes are moist.  Eyes:     Pupils: Pupils are equal, round, and reactive to light.  Cardiovascular:     Rate and Rhythm: Normal rate and regular rhythm.  Pulmonary:     Breath sounds: No rales.     Comments: Mild increased work of breathing Abdominal:     Comments: Obese abdomen, positive bowel sounds, no gross tenderness  Musculoskeletal:        General: Normal range of motion.     Cervical back: Normal range of motion.  Skin:    General: Skin is warm.   Neurological:     General: No focal deficit present.  Psychiatric:        Mood and Affect: Mood normal.     Data Reviewed:  There are no new results to review at this time. CT CHEST ABDOMEN PELVIS WO CONTRAST CLINICAL DATA:  Fever with hematuria.  Status post renal transplant.  EXAM: CT CHEST, ABDOMEN AND PELVIS WITHOUT CONTRAST  TECHNIQUE: Multidetector CT imaging of the chest, abdomen and pelvis was performed following the standard protocol without IV contrast.  RADIATION DOSE REDUCTION: This exam was performed according to the departmental dose-optimization program which includes automated exposure control, adjustment of the mA and/or kV according to patient size and/or use of iterative reconstruction technique.  COMPARISON:  CT stone study 01/03/2022.  FINDINGS: CT CHEST FINDINGS  Cardiovascular: The heart is enlarged. Coronary artery calcification is evident. Status post CABG. Mild atherosclerotic calcification is noted in the wall of the thoracic aorta. Enlargement of the pulmonary outflow tract/main pulmonary arteries suggests pulmonary arterial hypertension.  Mediastinum/Nodes: Densely calcified right paratracheal lymph node  or nodule is stable since 07/01/2019 consistent with benign etiology. Calcified nodal tissue is seen in the right hilum. The esophagus has normal imaging features. There is no axillary lymphadenopathy.  Lungs/Pleura: Fine architectural detail of the lungs is obscured bilaterally by breathing motion. 2.9 x 1.0 cm nodular consolidative opacity in the right lower lobe (86/4) is stable since prior. This is stable since chest CT 07/01/2019 consistent with benign etiology. No new suspicious pulmonary nodule or mass. No focal airspace consolidation. No pleural effusion.  Musculoskeletal: No worrisome lytic or sclerotic osseous abnormality.  CT ABDOMEN PELVIS FINDINGS  Hepatobiliary: No suspicious focal abnormality in the liver on  this study without intravenous contrast. Subtle nodularity of liver contour suggests cirrhosis. Gallbladder is not visualized. No intrahepatic or extrahepatic biliary dilation.  Pancreas: No focal mass lesion. No dilatation of the main duct. No intraparenchymal cyst. No peripancreatic edema.  Spleen: Mild splenomegaly.  Adrenals/Urinary Tract: No adrenal nodule or mass. Native kidneys are atrophic bilaterally. Small exophytic cyst lower pole right kidney is unchanged. No followup imaging is recommended. 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 No hydronephrosis in the upper pole collecting system although inferior moiety demonstrates mild hydronephrosis, new in the interval with mild fullness of the ureter of the lower pole Mighty down to the level of the ureteral confluence. Bladder is nondistended which likely accentuates the mild circumferential bladder wall thickening.  Stomach/Bowel: Surgical changes in the stomach compatible with sleeve gastrectomy. Duodenum is normally positioned as is the ligament of Treitz. No small bowel wall thickening. No small bowel dilatation. The terminal ileum is normal. The appendix is normal. No gross colonic mass. No colonic wall thickening.  Vascular/Lymphatic: There is mild atherosclerotic calcification of the abdominal aorta without aneurysm. There is no gastrohepatic or hepatoduodenal ligament lymphadenopathy. No retroperitoneal or mesenteric lymphadenopathy. No pelvic sidewall lymphadenopathy.  Reproductive: Sequelae of urolift procedure noted in the prostate.  Other: No intraperitoneal free fluid.  Musculoskeletal: No worrisome lytic or sclerotic osseous abnormality.  IMPRESSION: 1. No acute findings in the chest. 2. 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 No hydronephrosis in the upper pole collecting system although  inferior moiety demonstrates mild hydronephrosis, new in the interval with mild fullness of the lower pole ureter to the level of the ureteral confluence. No obstructing stone or mass lesion evident. 3. Enlargement of the pulmonary outflow tract/main pulmonary arteries suggests pulmonary arterial hypertension. 4. Stable 2.9 x 1.0 cm nodular consolidative opacity in the right lower lobe since chest CT 07/01/2019 consistent with benign etiology, potentially scarring. 5. Subtle changes in the liver suggesting cirrhosis and stable mild splenomegaly would be compatible with portal venous hypertension. 6.  Aortic Atherosclerosis (ICD10-I70.0).  Electronically Signed   By: Misty Stanley M.D.   On: 06/21/2022 10:01 CT HEAD WO CONTRAST (5MM) CLINICAL DATA:  Confusion.  Headache.  EXAM: CT HEAD WITHOUT CONTRAST  TECHNIQUE: Contiguous axial images were obtained from the base of the skull through the vertex without intravenous contrast.  RADIATION DOSE REDUCTION: This exam was performed according to the departmental dose-optimization program which includes automated exposure control, adjustment of the mA and/or kV according to patient size and/or use of iterative reconstruction technique.  COMPARISON:  11/05/2018  FINDINGS: Brain: There is no evidence for acute hemorrhage, hydrocephalus, mass lesion, or abnormal extra-axial fluid collection. No definite CT evidence for acute infarction. Patchy low attenuation in the deep hemispheric and periventricular white matter is nonspecific, but likely reflects chronic microvascular  ischemic demyelination.  Vascular: No hyperdense vessel or unexpected calcification.  Skull: No evidence for fracture. No worrisome lytic or sclerotic lesion.  Sinuses/Orbits: The visualized paranasal sinuses and right mastoid air cells are clear. Stable trace inferior left mastoid effusion. Visualized portions of the globes and intraorbital fat  are unremarkable.  Other: None.  IMPRESSION: 1. No acute intracranial abnormality. 2. Chronic small vessel ischemic disease. 3. Stable trace inferior left mastoid effusion.  Electronically Signed   By: Misty Stanley M.D.   On: 06/21/2022 09:36 DG Chest Portable 1 View CLINICAL DATA:  Shortness of breath, fever, mild confused, blood in urine, rolling and leaning to LEFT side, past history of renal transplant  EXAM: PORTABLE CHEST 1 VIEW  COMPARISON:  Portable exam 0850 hours compared to 04/02/2022  FINDINGS: Enlargement of cardiac silhouette post CABG.  Mediastinal contours and pulmonary vascularity normal. Atherosclerotic calcification aorta.  Large calcified RIGHT paratracheal lymph node stable.  Chronic elevation/eventration of RIGHT diaphragm with mild RIGHT basilar atelectasis.  Small nodular foci in the LEFT mid lung appear more prominent than on previous exam.  Remaining lungs clear.  No pleural effusion or pneumothorax.  Osseous structures unremarkable.  IMPRESSION: Enlargement of cardiac silhouette post CABG.  Old granulomatous disease with multiple small nodular foci in the LEFT mid lung, more prominent than on previous exam; potentially this could represent a an atypical infiltrate in a transplant patient but CT chest recommended to exclude developing pulmonary nodules.  Electronically Signed   By: Lavonia Dana M.D.   On: 06/21/2022 0000000  Last metabolic panel Lab Results  Component Value Date   GLUCOSE 93 06/21/2022   NA 140 06/21/2022   K 4.0 06/21/2022   CL 98 06/21/2022   CO2 32 06/21/2022   BUN 63 (H) 06/21/2022   CREATININE 2.18 (H) 06/21/2022   GFRNONAA 33 (L) 06/21/2022   CALCIUM 9.1 06/21/2022   PHOS 3.6 09/18/2021   PROT 7.1 06/21/2022   ALBUMIN 3.4 (L) 06/21/2022   BILITOT 1.3 (H) 06/21/2022   ALKPHOS 114 06/21/2022   AST 25 06/21/2022   ALT 14 06/21/2022   ANIONGAP 10 06/21/2022   Lab Results  Component Value Date    WBC 10.9 (H) 06/21/2022   HGB 12.3 (L) 06/21/2022   HCT 41.9 06/21/2022   MCV 78.3 (L) 06/21/2022   PLT 142 (L) 06/21/2022    Assessment and Plan: * Pyelonephritis Fevers malaise chills x 4 days with new onset hematuria over the past 24 hours Urinalysis and imaging indicative of pyelonephritis of transplanted kidney with active sepsis Baseline history of end-stage renal disease status post renal transplant on chronic immunosuppression IV cefepime for urinary coverage Urine culture Follow  Sepsis ([place]) Meet sepsis criteria with Tmax of 101.5 and heart rate in 100s White count 11 Noted urinary source with pyelonephritis and noted transplanted kidney Lactate 2.1 BP fairly stable in the low 100s Noted be on chronic prednisone in setting of renal transplant-consider stress dose steroids if blood pressure downtrends Initially given cefepime, Flagyl and vancomycin for infectious coverage Will transition to IV cefepime for urinary coverage Pancultured Trend lactate Monitor closely Critical care has been made aware of this patient for surveillance     CAD (coronary artery disease) S/p CABG in 2005 No active chest pain though with troponins in the 90s on presentation Suspect demand ischemia in the setting of sepsis and pyelonephritis A-fib with RVR on EKG Will continue home regimen including Imdur Trend troponin Formal consult cardiology given extensive prior cardiac history  Chronic combined systolic and diastolic CHF (congestive heart failure) ([place]) 2D echo January 2024 with LVEF of 40-45%  January 2023 admission does mention episode of cardiorenal syndrome assd w/ diuresis  Noted to be followed by the heart failure clinic Dry weight at hospital dc 03/2022 was 116kg  Pt 124kg today  Will defer diuresis for now given active sepsis  Cardiology team w/ Dr. Rockey Situ as well as critical care has been contacted for consult and comanagement   History of renal transplant History of  ESRD s/p renal transplant in 2012. Now with CKD stage IIIb. Baseline creatinine around 2.0 to 2.4.  Cr 2.2 today On prednisone, Myfortic and Prograf Continue home medications for now Continue stress dose steroids in the setting of sepsis if BP downtrends Nephrology consult   Essential hypertension BP 100s/50s in setting of sepsis  Hold BP regimen for now    Asthma, persistent controlled Fairly stable from a resp standpoint  Cont home inhalers   Type II diabetes mellitus with renal manifestations (HCC) SSI  A1C   GERD (gastroesophageal reflux disease) PPI  Pulmonary hypertension due to left heart disease ([place]) Primarily venous hypertension by [facility] 03/2021 admission  On imdur  Hold for now given LLN BP pending cardiology eval       Advance Care Planning:   Code Status: Full Code   Consults: Nephrology w/ Dr. Juleen China, Cardiology w/ Dr. Rockey Situ, Critical care w/ Dr. Gwendalyn Ege   Family Communication: Wife at the bedside   Severity of Illness: The appropriate patient status for this patient is INPATIENT. Inpatient status is judged to be reasonable and necessary in order to provide the required intensity of service to ensure the patient's safety. The patient's presenting symptoms, physical exam findings, and initial radiographic and laboratory data in the context of their chronic comorbidities is felt to place them at high risk for further clinical deterioration. Furthermore, it is not anticipated that the patient will be medically stable for discharge from the hospital within 2 midnights of admission.   * I certify that at the point of admission it is my clinical judgment that the patient will require inpatient hospital care spanning beyond 2 midnights from the point of admission due to high intensity of service, high risk for further deterioration and high frequency of surveillance required.*  Author: Deneise Lever, MD 06/21/2022 12:15 PM  For on call review [email or web address].

## 2022-06-21 NOTE — Assessment & Plan Note (Addendum)
Meet sepsis criteria with Tmax of 101.5 and heart rate in 100s White count 11 Noted urinary source with pyelonephritis and noted transplanted kidney Lactate 2.1 BP fairly stable in the low 100s Noted be on chronic prednisone in setting of renal transplant-consider stress dose steroids if blood pressure downtrends Initially given cefepime, Flagyl and vancomycin for infectious coverage Will transition to IV cefepime for urinary coverage Pancultured Trend lactate Monitor closely Critical care has been made aware of this patient for surveillance

## 2022-06-21 NOTE — Assessment & Plan Note (Signed)
Fevers malaise chills x 4 days with new onset hematuria over the past 24 hours Urinalysis and imaging indicative of pyelonephritis with active sepsis Baseline history of end-stage renal disease status post renal transplant on chronic immunosuppression IV cefepime for urinary coverage Urine culture Follow

## 2022-06-21 NOTE — Assessment & Plan Note (Signed)
SSI  A1C  

## 2022-06-21 NOTE — ED Triage Notes (Signed)
Patient presents with fever, blood in urine, mild confusion and sob that started last night.  Patient is a kidney transplant patient X12 years ago

## 2022-06-21 NOTE — Assessment & Plan Note (Signed)
Fairly stable from a resp standpoint  Cont home inhalers   

## 2022-06-21 NOTE — ED Provider Notes (Addendum)
[facility] Provider Note    Event Date/Time   First MD Initiated Contact with Patient 06/21/22 [phone]     (approximate)   History   Shortness of Breath, Fever, and Hematuria   HPI  [name]. is a 62 y.o. male with history of anemia secondary to CKD, diabetes, status post kidney transplant who comes in with concern for not feeling well.  Patient reports 4 days of not feeling well with intermittent shortness of breath more with exertion, intermittent fevers, then developing some blood in his urine yesterday.  He denies ever having blood in his urine before.  Denies any history of kidney stones.  He reports taking his immunosuppressant medications.  Denies any known cough.  Denies any chest pain.  Wife does report some intermittent confusion but denies any falls.  He is on Eliquis.  Has been compliant with this.  Has not taken any Tylenol this morning. Reports darker then normal urine as well.       Physical Exam   Triage Vital Signs: ED Triage Vitals  Enc Vitals Group     BP      Pulse      Resp      Temp      Temp src      SpO2      Weight      Height      Head Circumference      Peak Flow      Pain Score      Pain Loc      Pain Edu?      Excl. in [place]?     Most recent vital signs: Vitals:   06/21/22 0836  BP: (!) 143/84  Pulse: (!) 120  Resp: (!) 24  Temp: (!) 101.5 F (38.6 C)  SpO2: (!) 86%     General: Awake, no distress.  CV:  Good peripheral perfusion.  Resp:  Normal effort.  Abd:  No distention.  Other:  Patient feels warm on examination, tachycardic.   ED Results / Procedures / Treatments   Labs (all labs ordered are listed, but only abnormal results are displayed) Labs Reviewed  URINE CULTURE  CULTURE, BLOOD (ROUTINE X 2)  CULTURE, BLOOD (ROUTINE X 2)  RESP PANEL BY RT-PCR (RSV, FLU A&B, COVID)  RVPGX2  CBC WITH DIFFERENTIAL/PLATELET  COMPREHENSIVE METABOLIC PANEL  PROTIME-INR  APTT  URINALYSIS,  ROUTINE W REFLEX MICROSCOPIC  LACTIC ACID, PLASMA  LACTIC ACID, PLASMA  BRAIN NATRIURETIC PEPTIDE  CK  TROPONIN I (HIGH SENSITIVITY)     EKG  My interpretation of EKG:  Atrial fibrillation rate of 125 without any ST elevation or T wave inversion but there is some artifact and patient has a left bundle branch block  RADIOLOGY I have reviewed the xray personally and interpreted and no obvious PNA    PROCEDURES:  Critical Care performed: Yes, see critical care procedure note(s)  .1-3 Lead EKG Interpretation  Performed by: Vanessa [place], MD Authorized by: Vanessa [place], MD     Interpretation: abnormal     ECG rate:  120   ECG rate assessment: tachycardic     Rhythm: sinus tachycardia     Ectopy: none     Conduction: normal   .Critical Care  Performed by: Vanessa [place], MD Authorized by: Vanessa [place], MD   Critical care provider statement:    Critical care time (minutes):  30   Critical care was necessary to treat  or prevent imminent or life-threatening deterioration of the following conditions:  Sepsis   Critical care was time spent personally by me on the following activities:  Development of treatment plan with patient or surrogate, discussions with consultants, evaluation of patient's response to treatment, examination of patient, ordering and review of laboratory studies, ordering and review of radiographic studies, ordering and performing treatments and interventions, pulse oximetry, re-evaluation of patient's condition and review of old charts Ultrasound ED Peripheral IV (Provider)  Date/Time: 06/21/2022 9:50 AM  Performed by: Vanessa [place], MD Authorized by: Vanessa [place], MD   Procedure details:    Indications: hydration and multiple failed IV attempts     Skin Prep: chlorhexidine gluconate     Location:  Right forearm   Angiocath:  20 G   Bedside Ultrasound Guided: Yes     Images: not archived     Patient tolerated procedure without complications: Yes      Dressing applied: Yes      MEDICATIONS ORDERED IN ED: Medications  ceFEPIme (MAXIPIME) 2 g in sodium chloride 0.9 % 100 mL IVPB (has no administration in time range)  metroNIDAZOLE (FLAGYL) IVPB 500 mg (has no administration in time range)  vancomycin (VANCOCIN) IVPB 1000 mg/200 mL premix (has no administration in time range)  lactated ringers bolus 1,000 mL (has no administration in time range)  acetaminophen (TYLENOL) tablet 1,000 mg (has no administration in time range)     IMPRESSION / MDM / [place] / ED COURSE  I reviewed the triage vital signs and the nursing notes.   Patient's presentation is most consistent with acute presentation with potential threat to life or bodily function.   Patient comes in meeting sepsis criteria given immunosuppressed even though there is unclear source will give broad-spectrum antibiotics.  Patient also noted to be hypoxic.  Will get chest x-ray to evaluate for pneumonia, COVID flu, RSV.  Patient will be started off with 1 L of fluid given he does have a history of CHF and is on diuretics and reassess for additional fluid.  Patient given Tylenol for fever.  Patient placed on 2 L of oxygen due to respiratory failure  CBC shows elevated white count.  Urine looks consistent with UTI and this has been sent for culture.  COVID, flu were negative.  BNP slightly elevated he is on oxygen 2 L but he has no edema noted on CT imaging.  We will give 2 L of fluids for ideal body weight.  Initially tested 1 L and he seemed to tolerate well without any worsening respiratory compromise and he does have some slightly low blood pressures.  Currently maps are great earlier than 64 and he is mentating well do not feel that he meets criteria for pressors at this time but will need to be closely monitored for potential escalation.  I discussed the case with the hospitalist for admission.  His CT scan was concerning for possible pyelonephritis and there were some  incidental findings that were discussed with family and provided a copy of report.  They need to follow-up with GI for concern for possible new cirrhosis.  Will add on an ammonia level given they do report reports of intermittent confusion although his LFTs are normal which is reassuring.  This is pending at time of admission.  Troponin did come back elevated but suspect more likely demand.  11:04 AM did  repeat evaluation after liter of fluids heart rates have come down blood pressures are slightly  low but maps over 65 still mentating well looks well-perfused but I think cannot tolerate additional 1 L  The patient is on the cardiac monitor to evaluate for evidence of arrhythmia and/or significant heart rate changes.      FINAL CLINICAL IMPRESSION(S) / ED DIAGNOSES   Final diagnoses:  Acute respiratory failure with hypoxia (HCC)  Sepsis, due to unspecified organism, unspecified whether acute organ dysfunction present [facility])  Pyelonephritis     Rx / DC Orders   ED Discharge Orders     None        Note:  This document was prepared using Dragon voice recognition software and may include unintentional dictation errors.   Vanessa [place], MD 06/21/22 1104    Vanessa Par[place], MD 06/21/22 [phone]

## 2022-06-21 NOTE — Sepsis Progress Note (Signed)
Sepsis protocol monitored by eLink ?

## 2022-06-21 NOTE — Progress Notes (Signed)
CODE SEPSIS - PHARMACY COMMUNICATION  **Broad Spectrum Antibiotics should be administered within 1 hour of Sepsis diagnosis**  Time Code Sepsis Called/Page Received: [id number]  Antibiotics Ordered: Cefepime + vancomycin + metronidazole  Time of 1st antibiotic administration: 0953  Additional action taken by pharmacy: N/A  [name] 06/21/2022  9:57 AM

## 2022-06-21 NOTE — Assessment & Plan Note (Signed)
S/p CABG in 2005 No active chest pain though with troponins in the 90s on presentation Suspect demand ischemia in the setting of sepsis and pyelonephritis A-fib with RVR on EKG Will continue home regimen including Imdur Trend troponin Formal consult cardiology given extensive prior cardiac history

## 2022-06-21 NOTE — Consult Note (Signed)
PHARMACY -  BRIEF ANTIBIOTIC NOTE   Pharmacy has received consult(s) for Vancomycin and Cefepime from an ED provider.  The patient's profile has been reviewed for ht/wt/allergies/indication/available labs.    One time order(s) placed for Vancomycin 1g IV and Cefepime 2g IV x 1 dose each.  Further antibiotics/pharmacy consults should be ordered by admitting physician if indicated.                       Thank you, Pearla Dubonnet 06/21/2022  8:56 AM

## 2022-06-21 NOTE — Consult Note (Signed)
[facility]  CONSULT NOTE    Date: 06/21/2022                  Patient Name:  [name].  MRN: [id number]  DOB: 10-Aug-1960  Age / Sex: 62 y.o., male         PCP: [name], MD                 Service Requesting Consult: Dr. Ernestina Patches                 Reason for Consult: Acute kidney injury            History of Present Illness: [name]. Presents with shortness of breath, dyspnea on exertion, intermittent fevers and gross hematuria. Patient states he has been taking all his medications including apixaban and his immunomodulating agents: tacrolimus, mycophenolate and prednisone.   Patient found to have a urinary tract infection. He was found to have septic shock and started on aggressive IV fluids. Found to have pyelonephritis on CT.   Nephrology consulted for acute kidney injury on chronic kidney disease status post renal transplant.   Wife at bedside. History taken from chart and wife.    Medications: Outpatient medications: Medications Prior to Admission  Medication Sig Dispense Refill Last Dose   acetaminophen (TYLENOL) 325 MG tablet Take 2 tablets (650 mg total) by mouth every 8 (eight) hours as needed for mild pain. 40 tablet 0    albuterol (PROVENTIL) (2.5 MG/3ML) 0.083% nebulizer solution Take 3 mLs (2.5 mg total) by nebulization every 6 (six) hours as needed for wheezing or shortness of breath. 150 mL 0    albuterol (VENTOLIN HFA) 108 (90 Base) MCG/ACT inhaler Inhale 2 puffs into the lungs every 6 (six) hours as needed for wheezing or shortness of breath. 6.7 g 5    allopurinol (ZYLOPRIM) 100 MG tablet Take 100 mg by mouth every morning.      AMBULATORY NON FORMULARY MEDICATION Trimix (30/1/10)-(Pap/Phent/PGE)  Test Dose  19ml vial   Qty #3 Refills 0  [facility] [phone] Fax [phone] 3 mL 0    apixaban (ELIQUIS) 5 MG TABS tablet TAKE 1 TABLET BY MOUTH TWICE DAILY (Patient taking  differently: Take 5 mg by mouth 2 (two) times daily.) 180 tablet 1    beclomethasone (QVAR) 80 MCG/ACT inhaler Inhale 2 puffs into the lungs daily as needed (for shortness of breath).      buPROPion (WELLBUTRIN SR) 150 MG 12 hr tablet Take 1 tablet (150 mg total) by mouth 2 (two) times daily. 180 tablet 3    cholecalciferol (VITAMIN D3) 25 MCG (1000 UNIT) tablet Take 1,000 Units by mouth at bedtime.      colchicine 0.6 MG tablet Take 1 tablet (0.6 mg total) by mouth every other day 30 tablet 1    colchicine 0.6 MG tablet Take 1 tablet (0.6 mg total) by mouth every other day. 45 tablet 1    Continuous Blood Gluc Sensor (FREESTYLE LIBRE 3 SENSOR) MISC Use one every 2 weeks. 6 each 3    dapagliflozin propanediol (FARXIGA) 10 MG TABS tablet Take 1 tablet (10 mg total) by mouth daily before breakfast. 90 tablet 3    febuxostat (ULORIC) 40 MG tablet Take 1 tablet (40 mg total) by mouth daily. 90 tablet 5    fluticasone (FLONASE) 50 MCG/ACT nasal spray PLACE 2 SPRAYS INTO BOTH NOSTRILS DAILY. 16 g 11    gabapentin (  NEURONTIN) 300 MG capsule Take 1 capsule (300 mg total) by mouth 2 (two) times daily. 180 capsule 3    hydrALAZINE (APRESOLINE) 25 MG tablet Take 1 tablet (25 mg total) by mouth every 8 (eight) hours. 90 tablet 1    hydrocortisone 2.5 % cream Apply topically 3 (three) times daily as needed. 30 g 3    insulin glargine-yfgn (SEMGLEE) 100 UNIT/ML Pen Inject 35 Units into the skin daily. (Patient taking differently: Inject 35 Units into the skin at bedtime.) 45 mL 3    insulin lispro (HUMALOG KWIKPEN) 100 UNIT/ML KwikPen Inject 10-20 Units into the skin 3 (three) times daily. Per sliding scale      Insulin Pen Needle (UNIFINE PENTIPS) 31G X 5 MM MISC Use 4 times daily as directed 400 each 3    iron polysaccharides (FERREX 150) 150 MG capsule Take 1 capsule (150 mg total) by mouth daily.      isosorbide mononitrate (IMDUR) 30 MG 24 hr tablet Take 1 tablet (30 mg total) by mouth daily. 30 tablet 2     lidocaine (LIDODERM) 5 % Place 1 patch onto the skin daily.PLACE 1 PATCH ONTO THE SKIN FOR 12 (TWELVE) HOURS. REMOVE & DISCARD PATCH WITHIN 12 HOURS OR AS DIRECTED BY MD 60 patch 11    lidocaine (XYLOCAINE) 5 % ointment Apply 1 Application topically 3 (three) times daily as needed. Apply pea size amount to area as needed for pain. 50 g 0    metolazone (ZAROXOLYN) 2.5 MG tablet Take 1 tablet by mouth 3 times weekly. 15 tablet 3    montelukast (SINGULAIR) 10 MG tablet TAKE 1 TABLET BY MOUTH AT BEDTIME. (Patient taking differently: Take 10 mg by mouth at bedtime.) 90 tablet 3    Multiple Vitamin (MULTIVITAMIN WITH MINERALS) TABS tablet Take 1 tablet by mouth daily.      mycophenolate (MYFORTIC) 180 MG EC tablet Take 2 tablets (360 mg total) by mouth 2 (two) times daily. 360 tablet 3    omeprazole (PRILOSEC) 20 MG capsule TAKE 1 CAPSULE BY MOUTH DAILY. (Patient taking differently: Take 20 mg by mouth at bedtime.) 90 capsule 3    potassium chloride (KLOR-CON) 10 MEQ tablet Take 6 tablets (60 mEq total) by mouth 2 (two) times daily. 360 tablet 8    predniSONE (DELTASONE) 5 MG tablet Take 1 tablet (5 mg total) by mouth daily. 90 tablet 3    rosuvastatin (CRESTOR) 10 MG tablet Take 1 tablet (10 mg total) by mouth daily. (Patient taking differently: Take 10 mg by mouth at bedtime.) 90 tablet 3    sulfamethoxazole-trimethoprim (BACTRIM) 400-80 MG tablet TAKE 1 TABLET BY MOUTH EVERY MONDAY, WEDNESDAY, FRIDAY. (Patient taking differently: Take 1 tablet by mouth See admin instructions. Take one tablet by mouth on Monday, Wednesday and Fridays -AM) 36 tablet 3    tacrolimus (PROGRAF) 1 MG capsule Take 2 capsules (2 mg total) by mouth 2 times daily. 120 capsule 11    tamsulosin (FLOMAX) 0.4 MG CAPS capsule TAKE 1 CAPSULE BY MOUTH DAILY. (Patient taking differently: Take 0.4 mg by mouth daily after breakfast.) 90 capsule 3    Testosterone 10 MG/ACT (2%) GEL Apply 1 Pump topically daily to inner thighs - 2 pumps total.  60 g 0    torsemide (DEMADEX) 20 MG tablet Take 5 tablets (100 mg total) by mouth 2 (two) times daily. 300 tablet 3    zinc sulfate 220 (50 Zn) MG capsule Take 220 mg by mouth daily.  Current medications: Current Facility-Administered Medications  Medication Dose Route Frequency Provider Last Rate Last Admin   apixaban (ELIQUIS) tablet 5 mg  5 mg Oral BID Deneise Lever, MD       budesonide (PULMICORT) nebulizer solution 0.25 mg  0.25 mg Nebulization Daily PRN Dorothe Pea, RPH       buPROPion [name] SR) 12 hr tablet 150 mg  150 mg Oral BID Deneise Lever, MD       ceFEPIme (MAXIPIME) 2 g in sodium chloride 0.9 % 100 mL IVPB  2 g Intravenous Q12H [name], RPH       insulin aspart (novoLOG) injection 0-15 Units  0-15 Units Subcutaneous Q4H Deneise Lever, MD       lactated ringers infusion  150 mL/hr Intravenous Continuous Deneise Lever, MD 150 mL/hr at 06/21/22 1215 150 mL/hr at 06/21/22 1215   mycophenolate (MYFORTIC) EC tablet 360 mg  360 mg Oral BID Deneise Lever, MD       ondansetron [facility]) tablet 4 mg  4 mg Oral Q6H PRN Deneise Lever, MD       Or   ondansetron [facility]) injection 4 mg  4 mg Intravenous Q6H PRN Deneise Lever, MD       predniSONE (DELTASONE) tablet 5 mg  5 mg Oral Daily Deneise Lever, MD   5 mg at 06/21/22 1410      Allergies: Allergies  Allergen Reactions   Contrast Media [Iodinated Contrast Media] Other (See Comments)    Kidney transplant   Iodine Other (See Comments)    Other reaction(s): Other (See Comments) Kidney transplant   Ibuprofen Other (See Comments)    Due to kidney transplant Due to kidney transplant Due to kidney transplant      Past Medical History: Past Medical History:  Diagnosis Date   Anal condyloma    Anemia    Aortic atherosclerosis ([place])    Aortic stenosis    a.) TTE 10/2021: Mod AS. AVA 1.06cm^2 (VTI). Mean grad 43mmHg; b.) TTE 04/02/2022: no AV stenosis   Asthma, persistent controlled  02/25/2013   Attention or concentration deficit 04/26/2021   BPH with obstruction/lower urinary tract symptoms 09/25/2014   CAD (coronary artery disease) 2005   a.) LHC 1996 -> HG stenosis mLAD -> PTCA/PCI with stent x 1 (unk type) -> complicated by CFA pseudoanurysm (required surg);  b.) LHC 08/10/2002  -> 95% LAD, 70% ISR LAD, 80% pOM, 70% mRCA --> CVTS consult; c.) 4v CABG 08/13/2002; c.) 03/2018 MV: Small, fixed inferoapical and apical sep defect. No isc. EF 47% (60-65% by 05/2018 TTE); d.) 02/2021 MV: small Apical inf fixed defect. No isc -> low risk.   Cardiomegaly    Cellulitis and abscess of left leg 07/22/2020   Chronic atrial fibrillation    a.) CHA2DS2VASc = 4 (CHF, HTN, vascular disease history, T2DM);  b.) rate/rhythm maintained without pharmacological intervention; chronically anticoagulated with apixaban   Chronic combined systolic and diastolic CHF (congestive heart failure)    a.) TTE 7/18 : EF 45-50%; b.) TTE 05/2018 : EF 60-65%, RVSP 74.8; c.) TTE 12/2018: EF 50-55%. Sev dil LA; d.) TTE 04/2019: EF 45-50%; e.) TTE 07/2021: EF 40-45%, G3DD; f.) TTE 10/2021: EF 40-45%, mild conc LVH, septal-lat dyssynchrony (LBBB), mildly red RVSF, mild-mod dil LA, mildly dil RA, mild-mod MS, mod AS; g.) TTE 04/02/2022: EF 40-45%, glob HK, LVH, red RVSF, RVE, sev LAE, mild-mod RAE, mild MR   Chronic venous stasis dermatitis of  both lower extremities    Cytomegaloviral disease 2017   Diverticulosis of colon 04/22/2013   Elevated RIGHT hemidiaphragm    Erectile dysfunction    a.) on topical TRT + Trimix (alprostadil/papaverine/phentolamine) injections   ESRD (end stage renal disease) ([place])    a.) dialysis dependent 2004-2012; b.) s/p cadaveric RIGHT renal transplant 09/13/2010   Essential hypertension 12/17/2010   GERD (gastroesophageal reflux disease)    Gout    History of 2019 novel coronavirus disease (COVID-19) 06/30/2019   History of bilateral cataract extraction 10/2017   History of renal  transplant 09/13/2010   a.) s/p cadaveric donor transplant 09/13/2010   Hx of bilateral cataract extraction 10/2017   Hyperlipidemia    Hypogonadism in male 09/25/2014   Ischemic cardiomyopathy    Left cephalic vein thrombosis [id number]   Long term current use of anticoagulant    a.) apixaban   Long term current use of immunosuppressive drug    a.) mycophenolate + prednisone + tacrolimus   Major depressive disorder 10/04/2013   Mitral stenosis 11/07/2021   a.) TTE 11/07/2021: severe MAC, mild-mod MS (MPG 6 mmHg); b.) TTE 04/02/2022: no MV stenosis   Murmur    Obesity hypoventilation syndrome 03/31/2012   OSA treated with BiPAP 09/29/2012   PAH (pulmonary artery hypertension)    a. 04/2019 RHC: RA 19, RV 80/20, PA 78/31 (51), PCWP 25, CO/CI 7.69/3.26. PVR 3.25 --> Sev PAH, likely primarily PV HTN; b.) RHC 04/04/2022: mRA 13, mPA 40, mPCWP 20, PA sat 59, AO sat 92, CO 7.63, CI 3.26, PVR 2.6 --> mod portal venous HTN   Pancreatitis    S/P CABG x 4 08/13/2002   a.) LIMA-LAD, LRA-OM, SVG-D1, SVG-dRCA   S/P gastric bypass    Synovitis of finger 06/11/2019   Trigger finger, unspecified little finger 12/23/2018   Type 2 diabetes mellitus with hyperglycemia, with long-term current use of insulin 09/09/2014   a.) has FreeStyle Libre CGM   Type 2 MI (myocardial infarction) ([place])    Ulcer 05/2016   Left shin   Wears glasses      Past Surgical History: Past Surgical History:  Procedure Laterality Date   CARDIAC CATHETERIZATION N/A 08/10/2002   CATARACT EXTRACTION W/PHACO Right 10/29/2017   Procedure: CATARACT EXTRACTION PHACO AND INTRAOCULAR LENS PLACEMENT ([place])  RIGHT DIABETIC;  Surgeon: [name], MD;  Location: [place];  Service: Ophthalmology;  Laterality: Right   CATARACT EXTRACTION W/PHACO Left 11/18/2017   Procedure: CATARACT EXTRACTION PHACO AND INTRAOCULAR LENS PLACEMENT ([place]) LEFT IVA/TOPICAL;  Surgeon: [name], MD;  Location: [place];  Service: Ophthalmology;  Laterality: Left   COLONOSCOPY WITH PROPOFOL N/A 04/22/2013   Procedure: COLONOSCOPY WITH PROPOFOL;  Surgeon: [name], MD;  Location: [facility];  Service: Endoscopy;  Laterality: N/A;   CORONARY ANGIOPLASTY WITH STENT PLACEMENT N/A 1996   CORONARY ARTERY BYPASS GRAFT N/A 08/13/2002   Procedure: 4v CORONARY ARTERY BYPASS GRAFTING; Location: [name]; Surgeon: [name], MD   CYSTOSCOPY WITH INSERTION OF UROLIFT N/A 12/25/2021   Procedure: CYSTOSCOPY WITH INSERTION OF UROLIFT;  Surgeon: [name], MD;  Location: [facility];  Service: Urology;  Laterality: N/A;   DG ANGIO AV SHUNT*L*     right and left upper arms   FASCIOTOMY  03/03/2012   Procedure: FASCIOTOMY;  Surgeon: [name], MD;  Location: [place];  Service: Orthopedics;  Laterality: Right;  FASCIOTOMY RIGHT SMALL FINGER   FASCIOTOMY Left 08/17/2013   Procedure: FASCIOTOMY LEFT  RING;  Surgeon: [name], MD;  Location: [place];  Service: Orthopedics;  Laterality: Left;   INCISION AND DRAINAGE ABSCESS Left 10/15/2015   Procedure: INCISION AND DRAINAGE ABSCESS;  Surgeon: [name], MD;  Location: [facility];  Service: General;  Laterality: Left;   KIDNEY TRANSPLANT  09/13/2010   cadaver[facility]at [facility] HEART CATH N/A 11/15/2016   Procedure: RIGHT HEART CATH;  Surgeon: [name], MD;  Location: [place] [facility];  Service: Cardiovascular;  Laterality: N/A;   RIGHT HEART CATH N/A 05/03/2019   Procedure: RIGHT HEART CATH;  Surgeon: [name], MD;  Location: [place] [facility];  Service: Cardiovascular;  Laterality: N/A;   RIGHT HEART CATH N/A 04/04/2022   Procedure: RIGHT HEART CATH;  Surgeon: [name], MD;  Location: [place] [facility];  Service: Cardiovascular;  Laterality: N/A;   ROUX-EN-Y GASTRIC BYPASS N/A 2015   TYMPANIC MEMBRANE REPAIR Left 03/2010   VASECTOMY     WART FULGURATION N/A 05/31/2022   Procedure:  FULGURATION ANAL WART;  Surgeon: Benjamine Sprague, DO;  Location: [facility];  Service: General;  Laterality: N/A;     Family History: Family History  Problem Relation Age of Onset   Heart disease Father    Kidney failure Father    Kidney disease Father    Diabetes Maternal Grandmother    Breast cancer Maternal Grandmother    Valvular heart disease Mother    Liver cancer Paternal Uncle    Liver cancer Paternal Grandmother    Prostate cancer Neg Hx      Social History: Social History   Socioeconomic History   Marital status: Married    Spouse name: Not on file   Number of children: 2   Years of education: 14   Highest education level: Associate degree: academic program  Occupational History   Occupation: Warden/ranger: unemployed    Comment: disabled due to kidney failure  Tobacco Use   Smoking status: Former    Types: Cigars    Quit date: 09/02/1994    Years since quitting: 27.8    Passive exposure: Never   Smokeless tobacco: Never   Tobacco comments:    Occasional, rare cigars  Vaping Use   Vaping Use: Never used  Substance and Sexual Activity   Alcohol use: Not Currently    Comment: occ   Drug use: No   Sexual activity: Not on file  Other Topics Concern   Not on file  Social History Narrative   Has living will   Wife is health care POA   Would accept resuscitation attempts   No tube feedings if cognitively unaware   Social Determinants of Health   Financial Resource Strain: Not on file  Food Insecurity: No Food Insecurity (04/01/2022)   Hunger Vital Sign    Worried About Running Out of Food in the Last Year: Never true    Ran Out of Food in the Last Year: Never true  Transportation Needs: No Transportation Needs (04/01/2022)   PRAPARE - Hydrologist (Medical): No    Lack of Transportation (Non-Medical): No  Physical Activity: Not on file  Stress: Not on file  Social Connections: Not on file  Intimate  Partner Violence: Not At Risk (04/01/2022)   Humiliation, Afraid, Rape, and Kick questionnaire    Fear of Current or Ex-Partner: No    Emotionally Abused: No    Physically Abused:  No    Sexually Abused: No       Vital Signs: Blood pressure 108/66, pulse (!) 101, temperature 98.2 F (36.8 C), temperature source Oral, resp. rate 16, height 5\' 10"  (1.778 m), weight 124.3 kg, SpO2 100 %.  Weight trends: Filed Weights   06/21/22 0840  Weight: 124.3 kg    Physical Exam: General: Ill appearing  Head: Normocephalic, atraumatic. Moist oral mucosal membranes  Eyes: Anicteric, PERRL  Neck: Supple, trachea midline  Lungs:  Clear to auscultation  Heart: Regular rate and rhythm  Abdomen:  Soft, nontender,   Extremities:  no peripheral edema.  Neurologic: Nonfocal, moving all four extremities  Skin: No lesions  Access: none     Lab results: Basic Metabolic Panel: Recent Labs  Lab 06/21/22 0830  NA 140  K 4.0  CL 98  CO2 32  GLUCOSE 93  BUN 63*  CREATININE 2.18*  CALCIUM 9.1    Liver Function Tests: Recent Labs  Lab 06/21/22 0830  AST 25  ALT 14  ALKPHOS 114  BILITOT 1.3*  PROT 7.1  ALBUMIN 3.4*   No results for input(s): "LIPASE", "AMYLASE" in the last 168 hours. Recent Labs  Lab 06/21/22 1119  AMMONIA 19    CBC: Recent Labs  Lab 06/21/22 0830  WBC 10.9*  NEUTROABS 11.1*  HGB 12.3*  HCT 41.9  MCV 78.3*  PLT 142*    Cardiac Enzymes: Recent Labs  Lab 06/21/22 0830  CKTOTAL 38*    BNP: Invalid input(s): "POCBNP"  CBG: Recent Labs  Lab 06/21/22 1128 06/21/22 1302  GLUCAP 106* 119*    Microbiology: Results for orders placed or performed during the hospital encounter of 06/21/22  Resp panel by RT-PCR (RSV, Flu A&B, Covid) Anterior Nasal Swab     Status: None   Collection Time: 06/21/22  8:48 AM   Specimen: Anterior Nasal Swab  Result Value Ref Range Status   SARS Coronavirus 2 by RT PCR NEGATIVE NEGATIVE Final    Comment:  (NOTE) SARS-CoV-2 target nucleic acids are NOT DETECTED.  The SARS-CoV-2 RNA is generally detectable in upper respiratory specimens during the acute phase of infection. The lowest concentration of SARS-CoV-2 viral copies this assay can detect is 138 copies/mL. A negative result does not preclude SARS-Cov-2 infection and should not be used as the sole basis for treatment or other patient management decisions. A negative result may occur with  improper specimen collection/handling, submission of specimen other than nasopharyngeal swab, presence of viral mutation(s) within the areas targeted by this assay, and inadequate number of viral copies(<138 copies/mL). A negative result must be combined with clinical observations, patient history, and epidemiological information. The expected result is Negative.  Fact Sheet for Patients:  EntrepreneurPulse.com.au  Fact Sheet for Healthcare Providers:  IncredibleEmployment.be  This test is no t yet approved or cleared by the [place] FDA and  has been authorized for detection and/or diagnosis of SARS-CoV-2 by FDA under an Emergency Use Authorization (EUA). This EUA will remain  in effect (meaning this test can be used) for the duration of the COVID-19 declaration under Section 564(b)(1) of the Act, 21 U.S.C.section 360bbb-3(b)(1), unless the authorization is terminated  or revoked sooner.       Influenza A by PCR NEGATIVE NEGATIVE Final   Influenza B by PCR NEGATIVE NEGATIVE Final    Comment: (NOTE) The Xpert Xpress SARS-CoV-2/FLU/RSV plus assay is intended as an aid in the diagnosis of influenza from Nasopharyngeal swab specimens and should not be used as  a sole basis for treatment. Nasal washings and aspirates are unacceptable for Xpert Xpress SARS-CoV-2/FLU/RSV testing.  Fact Sheet for Patients: EntrepreneurPulse.com.au  Fact Sheet for Healthcare  Providers: IncredibleEmployment.be  This test is not yet approved or cleared by the [place] FDA and has been authorized for detection and/or diagnosis of SARS-CoV-2 by FDA under an Emergency Use Authorization (EUA). This EUA will remain in effect (meaning this test can be used) for the duration of the COVID-19 declaration under Section 564(b)(1) of the Act, 21 U.S.C. section 360bbb-3(b)(1), unless the authorization is terminated or revoked.     Resp Syncytial Virus by PCR NEGATIVE NEGATIVE Final    Comment: (NOTE) Fact Sheet for Patients: EntrepreneurPulse.com.au  Fact Sheet for Healthcare Providers: IncredibleEmployment.be  This test is not yet approved or cleared by the [place] FDA and has been authorized for detection and/or diagnosis of SARS-CoV-2 by FDA under an Emergency Use Authorization (EUA). This EUA will remain in effect (meaning this test can be used) for the duration of the COVID-19 declaration under Section 564(b)(1) of the Act, 21 U.S.C. section 360bbb-3(b)(1), unless the authorization is terminated or revoked.  Performed at [facility], [address] [place], [place] [address]    *Note: Due to a large number of results and/or encounters for the requested time period, some results have not been displayed. A complete set of results can be found in Results Review.    Coagulation Studies: Recent Labs    06/21/22 0848  LABPROT 19.8*  INR 1.7*    Urinalysis: Recent Labs    06/21/22 0848  COLORURINE RED*  LABSPEC 1.014  PHURINE TEST NOT REPORTED DUE TO COLOR INTERFERENCE OF URINE PIGMENT  GLUCOSEU TEST NOT REPORTED DUE TO COLOR INTERFERENCE OF URINE PIGMENT*  HGBUR TEST NOT REPORTED DUE TO COLOR INTERFERENCE OF URINE PIGMENT*  BILIRUBINUR TEST NOT REPORTED DUE TO COLOR INTERFERENCE OF URINE PIGMENT*  KETONESUR TEST NOT REPORTED DUE TO COLOR INTERFERENCE OF URINE PIGMENT*   PROTEINUR TEST NOT REPORTED DUE TO COLOR INTERFERENCE OF URINE PIGMENT*  NITRITE TEST NOT REPORTED DUE TO COLOR INTERFERENCE OF URINE PIGMENT*  LEUKOCYTESUR TEST NOT REPORTED DUE TO COLOR INTERFERENCE OF URINE PIGMENT*      Imaging: CT CHEST ABDOMEN PELVIS WO CONTRAST  Result Date: 06/21/2022 CLINICAL DATA:  Fever with hematuria.  Status post renal transplant. EXAM: CT CHEST, ABDOMEN AND PELVIS WITHOUT CONTRAST TECHNIQUE: Multidetector CT imaging of the chest, abdomen and pelvis was performed following the standard protocol without IV contrast. RADIATION DOSE REDUCTION: This exam was performed according to the departmental dose-optimization program which includes automated exposure control, adjustment of the mA and/or kV according to patient size and/or use of iterative reconstruction technique. COMPARISON:  CT stone study 01/03/2022. FINDINGS: CT CHEST FINDINGS Cardiovascular: The heart is enlarged. Coronary artery calcification is evident. Status post CABG. Mild atherosclerotic calcification is noted in the wall of the thoracic aorta. Enlargement of the pulmonary outflow tract/main pulmonary arteries suggests pulmonary arterial hypertension. Mediastinum/Nodes: Densely calcified right paratracheal lymph node or nodule is stable since 07/01/2019 consistent with benign etiology. Calcified nodal tissue is seen in the right hilum. The esophagus has normal imaging features. There is no axillary lymphadenopathy. Lungs/Pleura: Fine architectural detail of the lungs is obscured bilaterally by breathing motion. 2.9 x 1.0 cm nodular consolidative opacity in the right lower lobe (86/4) is stable since prior. This is stable since chest CT 07/01/2019 consistent with benign etiology. No new suspicious pulmonary nodule or mass. No focal airspace consolidation. No  pleural effusion. Musculoskeletal: No worrisome lytic or sclerotic osseous abnormality. CT ABDOMEN PELVIS FINDINGS Hepatobiliary: No suspicious focal  abnormality in the liver on this study without intravenous contrast. Subtle nodularity of liver contour suggests cirrhosis. Gallbladder is not visualized. No intrahepatic or extrahepatic biliary dilation. Pancreas: No focal mass lesion. No dilatation of the main duct. No intraparenchymal cyst. No peripancreatic edema. Spleen: Mild splenomegaly. Adrenals/Urinary Tract: No adrenal nodule or mass. Native kidneys are atrophic bilaterally. Small exophytic cyst lower pole right kidney is unchanged. No followup imaging is recommended. 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 No hydronephrosis in the upper pole collecting system although inferior moiety demonstrates mild hydronephrosis, new in the interval with mild fullness of the ureter of the lower pole Mighty down to the level of the ureteral confluence. Bladder is nondistended which likely accentuates the mild circumferential bladder wall thickening. Stomach/Bowel: Surgical changes in the stomach compatible with sleeve gastrectomy. Duodenum is normally positioned as is the ligament of Treitz. No small bowel wall thickening. No small bowel dilatation. The terminal ileum is normal. The appendix is normal. No gross colonic mass. No colonic wall thickening. Vascular/Lymphatic: There is mild atherosclerotic calcification of the abdominal aorta without aneurysm. There is no gastrohepatic or hepatoduodenal ligament lymphadenopathy. No retroperitoneal or mesenteric lymphadenopathy. No pelvic sidewall lymphadenopathy. Reproductive: Sequelae of urolift procedure noted in the prostate. Other: No intraperitoneal free fluid. Musculoskeletal: No worrisome lytic or sclerotic osseous abnormality. IMPRESSION: 1. No acute findings in the chest. 2. 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 No hydronephrosis in the upper pole collecting system although inferior moiety  demonstrates mild hydronephrosis, new in the interval with mild fullness of the lower pole ureter to the level of the ureteral confluence. No obstructing stone or mass lesion evident. 3. Enlargement of the pulmonary outflow tract/main pulmonary arteries suggests pulmonary arterial hypertension. 4. Stable 2.9 x 1.0 cm nodular consolidative opacity in the right lower lobe since chest CT 07/01/2019 consistent with benign etiology, potentially scarring. 5. Subtle changes in the liver suggesting cirrhosis and stable mild splenomegaly would be compatible with portal venous hypertension. 6.  Aortic Atherosclerosis (ICD10-I70.0). Electronically Signed   By: Misty Stanley M.D.   On: 06/21/2022 10:01   CT HEAD WO CONTRAST (5MM)  Result Date: 06/21/2022 CLINICAL DATA:  Confusion.  Headache. EXAM: CT HEAD WITHOUT CONTRAST TECHNIQUE: Contiguous axial images were obtained from the base of the skull through the vertex without intravenous contrast. RADIATION DOSE REDUCTION: This exam was performed according to the departmental dose-optimization program which includes automated exposure control, adjustment of the mA and/or kV according to patient size and/or use of iterative reconstruction technique. COMPARISON:  11/05/2018 FINDINGS: Brain: There is no evidence for acute hemorrhage, hydrocephalus, mass lesion, or abnormal extra-axial fluid collection. No definite CT evidence for acute infarction. Patchy low attenuation in the deep hemispheric and periventricular white matter is nonspecific, but likely reflects chronic microvascular ischemic demyelination. Vascular: No hyperdense vessel or unexpected calcification. Skull: No evidence for fracture. No worrisome lytic or sclerotic lesion. Sinuses/Orbits: The visualized paranasal sinuses and right mastoid air cells are clear. Stable trace inferior left mastoid effusion. Visualized portions of the globes and intraorbital fat are unremarkable. Other: None. IMPRESSION: 1. No acute  intracranial abnormality. 2. Chronic small vessel ischemic disease. 3. Stable trace inferior left mastoid effusion. Electronically Signed   By: Misty Stanley M.D.   On: 06/21/2022 09:36   DG Chest Portable 1 View  Result Date: 06/21/2022 CLINICAL  DATA:  Shortness of breath, fever, mild confused, blood in urine, rolling and leaning to LEFT side, past history of renal transplant EXAM: PORTABLE CHEST 1 VIEW COMPARISON:  Portable exam 0850 hours compared to 04/02/2022 FINDINGS: Enlargement of cardiac silhouette post CABG. Mediastinal contours and pulmonary vascularity normal. Atherosclerotic calcification aorta. Large calcified RIGHT paratracheal lymph node stable. Chronic elevation/eventration of RIGHT diaphragm with mild RIGHT basilar atelectasis. Small nodular foci in the LEFT mid lung appear more prominent than on previous exam. Remaining lungs clear. No pleural effusion or pneumothorax. Osseous structures unremarkable. IMPRESSION: Enlargement of cardiac silhouette post CABG. Old granulomatous disease with multiple small nodular foci in the LEFT mid lung, more prominent than on previous exam; potentially this could represent a an atypical infiltrate in a transplant patient but CT chest recommended to exclude developing pulmonary nodules. Electronically Signed   By: Lavonia Dana M.D.   On: 06/21/2022 09:03     Assessment & Plan: [name]. is a 61 y.o. Hispanic male with renal transplant in 2012, coronary artery disease status post CABG, atrial fibrillation, congestive heart failure, gout, GERD, hyperlipidemia, pulmonary hypertension, status post gastric bypass, diabetes mellitus type II, who was admitted to [facility] on 06/21/2022 for Pyelonephritis [N12] Acute respiratory failure with hypoxia [J96.01] Sepsis [A41.9] Sepsis, due to unspecified organism, unspecified whether acute organ dysfunction present [A41.9]  Acute kidney injury on chronic kidney disease stage IIIA-T: baseline creatinine  of 1.63, GFR of 47 on 05/30/22. Followed by [facility].  - check tacrolimus trough level - continue mycophenolate and prednisone - Continue PJP prophylaxis with Bactrim - check transplant renal doppler  Pyelonephritis/urinary tract infection/sepsis - holding dapagliflozin - empiric antibiotics: cefepime  Hypotension: with septic shock. Not requiring vasopressors. Holding home blood pressure regimen: hydralazine, isosorbide mononitrate, metolazone, tamsulosin and torsemide. - on IV F: LR at 59mL/hr - monitor volume status due to history of systolic congestive heart failure, echo with EF of 40-45% in 04/02/22.    Diabetes mellitus type II with chronic kidney disease: insulin dependent. Hemoglobin A1c of 7.7% on 05/15/22.      LOS: 0 Karlo Goeden 3/29/20242:15 PM

## 2022-06-21 NOTE — Assessment & Plan Note (Addendum)
2D echo January 2024 with LVEF of 40-45%  January 2023 admission does mention episode of cardiorenal syndrome assd w/ diuresis  Noted to be followed by the heart failure clinic Dry weight at hospital dc 03/2022 was 116kg  Pt 124kg today  Will defer diuresis for now given active sepsis  Cardiology team w/ Dr. Rockey Situ as well as critical care has been contacted for consult and comanagement

## 2022-06-22 ENCOUNTER — Inpatient Hospital Stay: Payer: Commercial Managed Care - PPO

## 2022-06-22 DIAGNOSIS — I4821 Permanent atrial fibrillation: Secondary | ICD-10-CM

## 2022-06-22 DIAGNOSIS — I255 Ischemic cardiomyopathy: Secondary | ICD-10-CM

## 2022-06-22 DIAGNOSIS — I2510 Atherosclerotic heart disease of native coronary artery without angina pectoris: Secondary | ICD-10-CM | POA: Diagnosis not present

## 2022-06-22 DIAGNOSIS — N12 Tubulo-interstitial nephritis, not specified as acute or chronic: Secondary | ICD-10-CM | POA: Diagnosis not present

## 2022-06-22 LAB — COMPREHENSIVE METABOLIC PANEL
ALT: 14 U/L (ref 0–44)
AST: 27 U/L (ref 15–41)
Albumin: 2.9 g/dL — ABNORMAL LOW (ref 3.5–5.0)
Alkaline Phosphatase: 91 U/L (ref 38–126)
Anion gap: 12 (ref 5–15)
BUN: 60 mg/dL — ABNORMAL HIGH (ref 8–23)
CO2: 28 mmol/L (ref 22–32)
Calcium: 8.8 mg/dL — ABNORMAL LOW (ref 8.9–10.3)
Chloride: 99 mmol/L (ref 98–111)
Creatinine, Ser: 2.2 mg/dL — ABNORMAL HIGH (ref 0.61–1.24)
GFR, Estimated: 33 mL/min — ABNORMAL LOW (ref >60)
Glucose, Bld: 96 mg/dL (ref 70–99)
Potassium: 4 mmol/L (ref 3.5–5.1)
Sodium: 139 mmol/L (ref 135–145)
Total Bilirubin: 1.6 mg/dL — ABNORMAL HIGH (ref 0.3–1.2)
Total Protein: 6.5 g/dL (ref 6.5–8.1)

## 2022-06-22 LAB — BLOOD CULTURE ID PANEL (REFLEXED) - BCID2
A.calcoaceticus-baumannii: NOT DETECTED
Bacteroides fragilis: NOT DETECTED
CTX-M ESBL: NOT DETECTED
Candida albicans: NOT DETECTED
Candida auris: NOT DETECTED
Candida glabrata: NOT DETECTED
Candida krusei: NOT DETECTED
Candida parapsilosis: NOT DETECTED
Candida tropicalis: NOT DETECTED
Carbapenem resist OXA 48 LIKE: NOT DETECTED
Carbapenem resistance IMP: NOT DETECTED
Carbapenem resistance KPC: NOT DETECTED
Carbapenem resistance NDM: NOT DETECTED
Carbapenem resistance VIM: NOT DETECTED
Cryptococcus neoformans/gattii: NOT DETECTED
Enterobacter cloacae complex: NOT DETECTED
Enterobacterales: DETECTED — AB
Enterococcus Faecium: NOT DETECTED
Enterococcus faecalis: NOT DETECTED
Escherichia coli: NOT DETECTED
Haemophilus influenzae: NOT DETECTED
Klebsiella aerogenes: NOT DETECTED
Klebsiella oxytoca: NOT DETECTED
Klebsiella pneumoniae: NOT DETECTED
Listeria monocytogenes: NOT DETECTED
Neisseria meningitidis: NOT DETECTED
Proteus species: NOT DETECTED
Pseudomonas aeruginosa: NOT DETECTED
Salmonella species: NOT DETECTED
Serratia marcescens: DETECTED — AB
Staphylococcus aureus (BCID): NOT DETECTED
Staphylococcus epidermidis: NOT DETECTED
Staphylococcus lugdunensis: NOT DETECTED
Staphylococcus species: NOT DETECTED
Stenotrophomonas maltophilia: NOT DETECTED
Streptococcus agalactiae: NOT DETECTED
Streptococcus pneumoniae: NOT DETECTED
Streptococcus pyogenes: NOT DETECTED
Streptococcus species: NOT DETECTED

## 2022-06-22 LAB — CBC
HCT: 38.2 % — ABNORMAL LOW (ref 39.0–52.0)
Hemoglobin: 11.2 g/dL — ABNORMAL LOW (ref 13.0–17.0)
MCH: 23.2 pg — ABNORMAL LOW (ref 26.0–34.0)
MCHC: 29.3 g/dL — ABNORMAL LOW (ref 30.0–36.0)
MCV: 79.1 fL — ABNORMAL LOW (ref 80.0–100.0)
Platelets: 108 10*3/uL — ABNORMAL LOW (ref 150–400)
RBC: 4.83 MIL/uL (ref 4.22–5.81)
RDW: 19.9 % — ABNORMAL HIGH (ref 11.5–15.5)
WBC: 10.1 10*3/uL (ref 4.0–10.5)
nRBC: 0 % (ref 0.0–0.2)

## 2022-06-22 LAB — GLUCOSE, CAPILLARY
Glucose-Capillary: 103 mg/dL — ABNORMAL HIGH (ref 70–99)
Glucose-Capillary: 122 mg/dL — ABNORMAL HIGH (ref 70–99)
Glucose-Capillary: 135 mg/dL — ABNORMAL HIGH (ref 70–99)
Glucose-Capillary: 162 mg/dL — ABNORMAL HIGH (ref 70–99)
Glucose-Capillary: 173 mg/dL — ABNORMAL HIGH (ref 70–99)
Glucose-Capillary: 93 mg/dL (ref 70–99)

## 2022-06-22 LAB — PHOSPHORUS: Phosphorus: 2.8 mg/dL (ref 2.5–4.6)

## 2022-06-22 LAB — MAGNESIUM: Magnesium: 2 mg/dL (ref 1.7–2.4)

## 2022-06-22 MED ORDER — IPRATROPIUM-ALBUTEROL 0.5-2.5 (3) MG/3ML IN SOLN: 3.0000 mL | Freq: Four times a day (QID) | RESPIRATORY_TRACT | Status: DC | PRN

## 2022-06-22 MED ORDER — ALPRAZOLAM 0.25 MG PO TABS: 0.2500 mg | ORAL_TABLET | Freq: Once | ORAL | Status: DC

## 2022-06-22 MED ORDER — TACROLIMUS 1 MG PO CAPS
2.0000 mg | ORAL_CAPSULE | Freq: Two times a day (BID) | ORAL | Status: DC
  Administered 2022-06-22 – 2022-06-28 (×13): 2 mg via ORAL
  Filled 2022-06-22 (×13): qty 2

## 2022-06-22 MED ORDER — TORSEMIDE 20 MG PO TABS
100.0000 mg | ORAL_TABLET | Freq: Two times a day (BID) | ORAL | Status: DC
  Administered 2022-06-23 – 2022-06-24 (×3): 100 mg via ORAL
  Filled 2022-06-22 (×3): qty 5

## 2022-06-22 MED ORDER — ROSUVASTATIN CALCIUM 10 MG PO TABS
10.0000 mg | ORAL_TABLET | Freq: Every day | ORAL | Status: DC
  Administered 2022-06-22 – 2022-06-27 (×6): 10 mg via ORAL
  Filled 2022-06-22 (×6): qty 1

## 2022-06-22 MED ORDER — FUROSEMIDE 10 MG/ML IJ SOLN
INTRAMUSCULAR | Status: AC
  Filled 2022-06-22: qty 8

## 2022-06-22 MED ORDER — ALPRAZOLAM 0.25 MG PO TABS
0.2500 mg | ORAL_TABLET | Freq: Once | ORAL | Status: AC
  Administered 2022-06-22: 0.25 mg via ORAL
  Filled 2022-06-22: qty 1

## 2022-06-22 MED ORDER — FUROSEMIDE 10 MG/ML IJ SOLN
60.0000 mg | Freq: Once | INTRAMUSCULAR | Status: AC
  Administered 2022-06-22: 60 mg via INTRAVENOUS

## 2022-06-22 MED ORDER — ACETAMINOPHEN 500 MG PO TABS
1000.0000 mg | ORAL_TABLET | Freq: Four times a day (QID) | ORAL | Status: DC | PRN
  Administered 2022-06-22 – 2022-06-26 (×9): 1000 mg via ORAL
  Filled 2022-06-22 (×10): qty 2

## 2022-06-22 MED ORDER — TORSEMIDE 20 MG PO TABS: 100.0000 mg | ORAL_TABLET | Freq: Two times a day (BID) | ORAL | Status: DC

## 2022-06-22 MED ORDER — SODIUM CHLORIDE 0.9 % IV SOLN
500.0000 mg | INTRAVENOUS | Status: DC
  Administered 2022-06-22 – 2022-06-23 (×2): 500 mg via INTRAVENOUS
  Filled 2022-06-22 (×3): qty 5

## 2022-06-22 NOTE — Progress Notes (Signed)
       CROSS COVER NOTE  NAME: [name]. MRN: [id number] DOB : 10-16-1960    HPI/Events of Note   Report:Notified patient report 6/0 chest pain after rolling in bed for hygiene. NO radiation or accompanying symptoms. Chest pain then resolved on own and patient  reported feeling anxious and requested med  On review of chart:Admitted for pyelonephritis Significant cardiac history CAD with CABG, ischemic cardiomyopthy HFrEF, aotic stenosis. Required additional lasix this afternoon for fluid overload   Assessment and  Interventions   Assessment: EKG - a fib with controlled ventricular response Patient resting with eyes closed. VSS  Plan: Xanax initially planned but discontinued with resolution of his symptoms  Continue to monitor      Kathlene Cote NP [facility]

## 2022-06-22 NOTE — Progress Notes (Signed)
[facility] Progress Note  Patient: [name].    [id number]  DOA: 06/21/2022     Date of Service: the patient was seen and examined on 06/22/2022  Chief Complaint  Patient presents with   Shortness of Breath   Fever   Hematuria   Brief hospital course: [name]. is a 62 y.o. male with PMH of CAD s/p CABG in 2005, ischemic cardiomyopathy LVEF 40 to 45% as per echo Jan 2024, pulmonary hypertension, chronic A. Fib on Eliquis, aortic stenosis, HTN, type 2 DM, ESRD s/p renal transplant in 2012 now with stage III CKD, OSA on CPAP, gout presenting with sepsis, pyelonephritis.  ED w/up: Tmax 101.5, HR 100s, BP 80s to 100s over 50s, on 2 L nasal cannula to keep O2 sats greater than 92%.  Wbx 10.9, Plts 142, LA 2.1---1.8, roponin 90s to 120s, COVID flu and RSV negative. BNP 465. Creatinine 2.2.  CT head grossly stable. CT ch/abd/p: hydronephrosis of transplanted kidney. Noted 2.9 x 1 cm nodular Consolidative opacity in the right lower lobe present since 2021. Changes in liver consistent with cirrhosis splenomegaly with some concern for portal vein hypertension.   Assessment and Plan:  Sepsis secondary to pyelonephritis and possible atypical pneumonia Sepsis criteria Tmax 101.5, heart rate 100, hypotensive, WBC elevated, pyelonephritis, lactic acid 2.1, hypoxic respiratory failure S/p IV fluid, DC'd due to pulmonary edema and respiratory failure   Pyelonephritis Baseline history of end-stage renal disease status post renal transplant on chronic immunosuppression  Pending tacrolimus trough level. Continue tacrolimus 2g PO bid.  Continue mycophenolate and prednisone Continue PJP prophylaxis with Bactrim S/p vancomycin and Flagyl Continue cefepime 2 g every 12 hourly Urine cx growing Serratia marcescens  Blood culture growing Serratia marcescens, follow sensitivity report Repeat blood cultures tomorrow a.m. We will consult ID on Monday for  antibiotics   Acute hypoxic respiratory failure, multifactorial could be due to pneumonia versus CHF Started empiric azithromycin 500 mg IV daily S/p Lasix 60 mg x 1 dose, started torsemide 100 mg p.o. twice daily Continue diuresis as per Nephrology and Cardiology Continue supplemental O2 inhalation and gradually wean off  CAD (coronary artery disease) S/p CABG in 2005 No active chest pain though with troponins in the 90s on presentation Suspect demand ischemia in the setting of sepsis and pyelonephritis A-fib with RVR on EKG Continue Eliquis Continue telemetry monitor Trop 97--126  consulted cardiology    Chronic systolic congestive heart failure  2D echo January 2024 with LVEF of 40-45%  January 2023 admission does mention episode of cardiorenal syndrome assd w/ diuresis  Noted to be followed by the heart failure clinic Dry weight at hospital dc 03/2022 was 116kg  BNP 465 elevated  Cardiology following   Essential hypertension BP 100s/50s in setting of sepsis  Hold BP regimen for now     H/o asthma, patient has shortness of breath most likely due to pneumonia and CHF Continue management as above Started DuoNeb every 6 hourly as needed   Type II diabetes mellitus with renal manifestations SSI  A1C     GERD (gastroesophageal reflux disease) PPI   Pulmonary hypertension due to left heart disease Primarily venous hypertension by [place] 03/2021 admission,  On imdur which has been held due to low blood pressure Cardiology following   Body mass index is 39.31 kg/m.  Interventions:       Diet: Heart healthy/carb modified diet DVT Prophylaxis: Therapeutic Anticoagulation with Eliquis    Advance goals of care discussion:  Full code  Family Communication: family was present at bedside, at the time of interview.  The pt provided permission to discuss medical plan with the family. Opportunity was given to ask question and all questions were answered satisfactorily.    Disposition:  Pt is from Home, admitted with sepsis, pyelonephritis, still on IV antibiotics, need to repeat blood cultures, which precludes a safe discharge. Discharge to home, when clinically stable.  May need few days to improve.  Subjective: No significant events overnight, patient was complaining of headache, no abdominal pain.  Denied any chest pain or palpitation, patient was having worsening of shortness of breath and requiring supplemental O2 inhalation via nasal cannula.  Patient seemed lethargic.  Physical Exam: General: NAD, lying comfortably, mild respiratory distress Eyes: PERRLA ENT: Oral Mucosa Clear, moist  Neck: no JVD,  Cardiovascular: S1 and S2 Present, no Murmur,  Respiratory: Good air entry bilaterally, increased respiratory efforts, bilateral crackles, no wheezing appreciated Abdomen: Bowel Sound present, Soft and no tenderness,  Skin: no rashes Extremities: no Pedal edema, no calf tenderness, chronic venous stasis pigmentation Neurologic: without any new focal findings Gait not checked due to patient safety concerns  Vitals:   06/22/22 1100 06/22/22 1200 06/22/22 1300 06/22/22 1400  BP: 135/67 119/71 128/67 126/66  Pulse: (!) 110 (!) 112 (!) 103 86  Resp: (!) 29 18 17 18   Temp:  98.3 F (36.8 C)    TempSrc:      SpO2: 96% 95% 97% 97%  Weight:      Height:        Intake/Output Summary (Last 24 hours) at 06/22/2022 1644 Last data filed at 06/22/2022 1004 Gross per 24 hour  Intake 1401.43 ml  Output 2250 ml  Net -848.57 ml   Filed Weights   06/21/22 0840  Weight: 124.3 kg    Data Reviewed: I have personally reviewed and interpreted daily labs, tele strips, imagings as discussed above. I reviewed all nursing notes, pharmacy notes, vitals, pertinent old records I have discussed plan of care as described above with RN and patient/family.  CBC: Recent Labs  Lab 06/21/22 0830 06/22/22 0233  WBC 10.9* 10.1  NEUTROABS 11.1*  --   HGB 12.3* 11.2*   HCT 41.9 38.2*  MCV 78.3* 79.1*  PLT 142* 123XX123*   Basic Metabolic Panel: Recent Labs  Lab 06/21/22 0830 06/22/22 0230 06/22/22 0233  NA 140  --  139  K 4.0  --  4.0  CL 98  --  99  CO2 32  --  28  GLUCOSE 93  --  96  BUN 63*  --  60*  CREATININE 2.18*  --  2.20*  CALCIUM 9.1  --  8.8*  MG  --  2.0  --   PHOS  --  2.8  --     Studies: DG Chest Port 1 View  Result Date: 06/22/2022 CLINICAL DATA:  Shortness of breath.  Admitted for pyelonephritis. EXAM: PORTABLE CHEST 1 VIEW COMPARISON:  Chest radiographs 06/21/2022 and 04/02/2022. CT 06/21/2022. FINDINGS: 1131 hours. The heart size and mediastinal contours are stable status post median sternotomy and CABG. Chronic vascular congestion without overt pulmonary edema or confluent airspace opacity. There is no pleural effusion or pneumothorax. The bones appear unchanged.  Telemetry leads overlie the chest. IMPRESSION: Stable examination. Chronic vascular congestion without overt edema or confluent airspace opacity. Electronically Signed   By: Richardean Sale M.D.   On: 06/22/2022 11:48   US Renal Transplant w/Doppler  Result Date: 06/21/2022  CLINICAL DATA:  Acute kidney failure, urinary tract infection EXAM: ULTRASOUND OF RENAL TRANSPLANT WITH RENAL DOPPLER ULTRASOUND TECHNIQUE: Ultrasound examination of the renal transplant was performed with gray-scale, color and duplex doppler evaluation. COMPARISON:  CT today FINDINGS: Transplant kidney location: Right lower quadrant Transplant Kidney: Renal measurements: 14.0 x 6.7 x 6.1 cm = volume: 254mL. Normal size and echotexture. Mild hydronephrosis in the lower pole. Color flow in the main renal artery:  Yes Color flow in the main renal vein:  Yes Duplex Doppler Evaluation: Main Renal Artery Resistive Index: 0.84 Venous waveform in main renal vein:  Present Intrarenal resistive index in upper pole:  0.79 (normal 0.6-0.8; equivocal 0.8-0.9; abnormal >= 0.9) Intrarenal resistive index in lower pole:  0.8 (normal 0.6-0.8; equivocal 0.8-0.9; abnormal >= 0.9) Bladder: Not visualized Other findings:  None. IMPRESSION: Mild fullness of the collecting system in the lower pole of the right lower quadrant renal transplant. Electronically Signed   By: Rolm Baptise M.D.   On: 06/21/2022 18:34    Scheduled Meds:  apixaban  5 mg Oral BID   buPROPion  150 mg Oral BID   Chlorhexidine Gluconate Cloth  6 each Topical Daily   furosemide       insulin aspart  0-15 Units Subcutaneous Q4H   mycophenolate  360 mg Oral BID   predniSONE  5 mg Oral Daily   rosuvastatin  10 mg Oral QHS   sulfamethoxazole-trimethoprim  1 tablet Oral Q M,W,F   tacrolimus  2 mg Oral BID   [START ON 06/23/2022] torsemide  100 mg Oral BID   Continuous Infusions:  azithromycin 500 mg (06/22/22 1214)   ceFEPime (MAXIPIME) IV 2 g (06/22/22 1007)   PRN Meds: acetaminophen, budesonide (PULMICORT) nebulizer solution, furosemide, ondansetron **OR** ondansetron (ZOFRAN) IV  Time spent: 55 minutes  Author: Val Riles. MD [facility] 06/22/2022 4:44 PM  To reach On-call, see care teams to locate the attending and reach out to them via [email or web address]. If 7PM-7AM, please contact night-coverage If you still have difficulty reaching the attending provider, please page the [facility] (Director on Call) for [facility] on amion for assistance.

## 2022-06-22 NOTE — Progress Notes (Signed)
PHARMACY - PHYSICIAN COMMUNICATION CRITICAL VALUE ALERT - BLOOD CULTURE IDENTIFICATION (BCID)  [name]. is an 62 y.o. male who presented to [facility] on 06/21/2022 with a chief complaint of shortness of breath, fever, and gross hematuria.  Name of physician (or Provider) Contacted: Dr Val Riles  Current antibiotics: Cefepime 2gm IVPB q 12 hrs  Changes to prescribed antibiotics recommended:  Patient is on recommended antibiotics - No changes needed  Results for orders placed or performed during the hospital encounter of 06/21/22  Blood Culture ID Panel (Reflexed) (Collected: 06/21/2022  8:38 AM)  Result Value Ref Range   Enterococcus faecalis NOT DETECTED NOT DETECTED   Enterococcus Faecium NOT DETECTED NOT DETECTED   Listeria monocytogenes NOT DETECTED NOT DETECTED   Staphylococcus species NOT DETECTED NOT DETECTED   Staphylococcus aureus (BCID) NOT DETECTED NOT DETECTED   Staphylococcus epidermidis NOT DETECTED NOT DETECTED   Staphylococcus lugdunensis NOT DETECTED NOT DETECTED   Streptococcus species NOT DETECTED NOT DETECTED   Streptococcus agalactiae NOT DETECTED NOT DETECTED   Streptococcus pneumoniae NOT DETECTED NOT DETECTED   Streptococcus pyogenes NOT DETECTED NOT DETECTED   A.calcoaceticus-baumannii NOT DETECTED NOT DETECTED   Bacteroides fragilis NOT DETECTED NOT DETECTED   Enterobacterales DETECTED (A) NOT DETECTED   Enterobacter cloacae complex NOT DETECTED NOT DETECTED   Escherichia coli NOT DETECTED NOT DETECTED   Klebsiella aerogenes NOT DETECTED NOT DETECTED   Klebsiella oxytoca NOT DETECTED NOT DETECTED   Klebsiella pneumoniae NOT DETECTED NOT DETECTED   Proteus species NOT DETECTED NOT DETECTED   Salmonella species NOT DETECTED NOT DETECTED   Serratia marcescens DETECTED (A) NOT DETECTED   Haemophilus influenzae NOT DETECTED NOT DETECTED   Neisseria meningitidis NOT DETECTED NOT DETECTED   Pseudomonas aeruginosa NOT DETECTED NOT DETECTED    Stenotrophomonas maltophilia NOT DETECTED NOT DETECTED   Candida albicans NOT DETECTED NOT DETECTED   Candida auris NOT DETECTED NOT DETECTED   Candida glabrata NOT DETECTED NOT DETECTED   Candida krusei NOT DETECTED NOT DETECTED   Candida parapsilosis NOT DETECTED NOT DETECTED   Candida tropicalis NOT DETECTED NOT DETECTED   Cryptococcus neoformans/gattii NOT DETECTED NOT DETECTED   CTX-M ESBL NOT DETECTED NOT DETECTED   Carbapenem resistance IMP NOT DETECTED NOT DETECTED   Carbapenem resistance KPC NOT DETECTED NOT DETECTED   Carbapenem resistance NDM NOT DETECTED NOT DETECTED   Carbapenem resist OXA 48 LIKE NOT DETECTED NOT DETECTED   Carbapenem resistance VIM NOT DETECTED NOT DETECTED    Andrw Mcguirt Rodriguez-Guzman PharmD, BCPS 06/22/2022 2:32 PM

## 2022-06-22 NOTE — Consult Note (Signed)
Pharmacy Antibiotic Note  [name]. is a 62 y.o. male with PMH including CKD s/p renal transplant, CAD, HTN, HLD, gout, asthma, Afib, BPH with LUTS, combined systolic and diastolic CHF, GERD, obesity, DM, PAH, depression admitted on 06/21/2022 with  pyelonephritis . Code sepsis activated in the ED. Pharmacy has been consulted for cefepime dosing.  -also on Azithromycin - Bactrim for PJP prophylaxis -Tacromilus and mycophenolate for renal transplant -Bcx 3/29: 1 of 4 (aerobic) bottles= GNR (BCID=Serratia marcescens) -Ucx 3/29: Serratia Marcescens  Plan:  Cefepime 2 g IV q12h  for Crcl 46 ml/min  Follow-up cultures, clinical course, duration (patient is immunocompromised status)  Height: 5\' 10"  (177.8 cm) Weight: 124.3 kg (274 lb) IBW/kg (Calculated) : 73  Temp (24hrs), Avg:99.1 F (37.3 C), Min:98 F (36.7 C), Max:101.6 F (38.7 C)  Recent Labs  Lab 06/21/22 0830 06/21/22 0839 06/21/22 1119 06/22/22 0233  WBC 10.9*  --   --  10.1  CREATININE 2.18*  --   --  2.20*  LATICACIDVEN  --  2.1* 1.8  --      Estimated Creatinine Clearance: 46 mL/min (A) (by C-G formula based on SCr of 2.2 mg/dL (H)).    Allergies  Allergen Reactions   Contrast Media [Iodinated Contrast Media] Other (See Comments)    Kidney transplant   Iodine Other (See Comments)    Other reaction(s): Other (See Comments) Kidney transplant   Ibuprofen Other (See Comments)    Due to kidney transplant Due to kidney transplant Due to kidney transplant    Antimicrobials this admission: Vancomycin 3/29 x 1 Metronidazole 3/29 x 1 Cefepime 3/29 >>   Dose adjustments this admission: N/A  Microbiology results: -Bcx 3/29: 1 of 4 (aerobic) bottles= GNR (BCID=Serratia marcescens) -Ucx 3/29: Serratia Marcescens  Thank you for allowing pharmacy to be a part of this patient's care.  [name] A 06/22/2022 2:51 PM

## 2022-06-22 NOTE — Progress Notes (Signed)
[facility]  ROUNDING NOTE   Subjective:   [name]. presents with shortness of breath, dyspnea on exertion, intermittent fevers and gross hematuria. Patient states he has been taking all his medications including apixaban and his immunomodulating agents: tacrolimus, mycophenolate and prednisone.    Patient found to have a urinary tract infection. He was found to have septic shock and started on aggressive IV fluids. Found to have pyelonephritis on CT.    Nephrology consulted for acute kidney injury on chronic kidney disease status post renal transplant.    Wife at bedside.  Tmax 101.6  UOP 1826mL  LR at 64mL/hr  Objective:  Vital signs in last 24 hours:  Temp:  [98 F (36.7 C)-101.6 F (38.7 C)] 98.3 F (36.8 C) (03/30 0600) Pulse Rate:  [56-109] 92 (03/30 0700) Resp:  [13-31] 23 (03/30 0700) BP: (88-145)/(55-96) 139/96 (03/30 0700) SpO2:  [90 %-100 %] 100 % (03/30 0700)  Weight change:  Filed Weights   06/21/22 0840  Weight: 124.3 kg    Intake/Output: I/O last 3 completed shifts: In: 1401.4 [I.V.:1301.4; IV Piggyback:100] Out: [id number] [Urine:1850]   Intake/Output this shift:  Total I/O In: -  Out: 500 [Urine:500]  Physical Exam: General: Ill appearing  Head: Normocephalic, atraumatic. Moist oral mucosal membranes  Eyes: Anicteric, PERRL  Neck: Supple, trachea midline  Lungs:  Clear to auscultation, 3L [place]  Heart: Irregular  Abdomen:  Obese, LLQ allograft kidney - mild tenderness  Extremities:  trace peripheral edema.  Neurologic: Nonfocal, moving all four extremities  Skin: No lesions       Basic Metabolic Panel: Recent Labs  Lab 06/21/22 0830 06/22/22 0230 06/22/22 0233  NA 140  --  139  K 4.0  --  4.0  CL 98  --  99  CO2 32  --  28  GLUCOSE 93  --  96  BUN 63*  --  60*  CREATININE 2.18*  --  2.20*  CALCIUM 9.1  --  8.8*  MG  --  2.0  --   PHOS  --  2.8  --     Liver Function Tests: Recent Labs  Lab  06/21/22 0830 06/22/22 0233  AST 25 27  ALT 14 14  ALKPHOS 114 91  BILITOT 1.3* 1.6*  PROT 7.1 6.5  ALBUMIN 3.4* 2.9*   No results for input(s): "LIPASE", "AMYLASE" in the last 168 hours. Recent Labs  Lab 06/21/22 1119  AMMONIA 19    CBC: Recent Labs  Lab 06/21/22 0830 06/22/22 0233  WBC 10.9* 10.1  NEUTROABS 11.1*  --   HGB 12.3* 11.2*  HCT 41.9 38.2*  MCV 78.3* 79.1*  PLT 142* 108*    Cardiac Enzymes: Recent Labs  Lab 06/21/22 0830  CKTOTAL 38*    BNP: Invalid input(s): "POCBNP"  CBG: Recent Labs  Lab 06/21/22 1640 06/21/22 1930 06/21/22 2331 06/22/22 0325 06/22/22 0727  GLUCAP 122* 146* 126* 103* 80    Microbiology: Results for orders placed or performed during the hospital encounter of 06/21/22  Blood culture (routine x 2)     Status: None (Preliminary result)   Collection Time: 06/21/22  8:38 AM   Specimen: BLOOD  Result Value Ref Range Status   Specimen Description BLOOD RIGHT ARM  Final   Special Requests NONE  Final   Culture   Final    NO GROWTH < 24 HOURS Performed at [facility], [address] [place], [place] [address]    Report Status  PENDING  Incomplete  Resp panel by RT-PCR (RSV, Flu A&B, Covid) Anterior Nasal Swab     Status: None   Collection Time: 06/21/22  8:48 AM   Specimen: Anterior Nasal Swab  Result Value Ref Range Status   SARS Coronavirus 2 by RT PCR NEGATIVE NEGATIVE Final    Comment: (NOTE) SARS-CoV-2 target nucleic acids are NOT DETECTED.  The SARS-CoV-2 RNA is generally detectable in upper respiratory specimens during the acute phase of infection. The lowest concentration of SARS-CoV-2 viral copies this assay can detect is 138 copies/mL. A negative result does not preclude SARS-Cov-2 infection and should not be used as the sole basis for treatment or other patient management decisions. A negative result may occur with  improper specimen collection/handling, submission of specimen other than  nasopharyngeal swab, presence of viral mutation(s) within the areas targeted by this assay, and inadequate number of viral copies(<138 copies/mL). A negative result must be combined with clinical observations, patient history, and epidemiological information. The expected result is Negative.  Fact Sheet for Patients:  EntrepreneurPulse.com.au  Fact Sheet for Healthcare Providers:  IncredibleEmployment.be  This test is no t yet approved or cleared by the [place] FDA and  has been authorized for detection and/or diagnosis of SARS-CoV-2 by FDA under an Emergency Use Authorization (EUA). This EUA will remain  in effect (meaning this test can be used) for the duration of the COVID-19 declaration under Section 564(b)(1) of the Act, 21 U.S.C.section 360bbb-3(b)(1), unless the authorization is terminated  or revoked sooner.       Influenza A by PCR NEGATIVE NEGATIVE Final   Influenza B by PCR NEGATIVE NEGATIVE Final    Comment: (NOTE) The Xpert Xpress SARS-CoV-2/FLU/RSV plus assay is intended as an aid in the diagnosis of influenza from Nasopharyngeal swab specimens and should not be used as a sole basis for treatment. Nasal washings and aspirates are unacceptable for Xpert Xpress SARS-CoV-2/FLU/RSV testing.  Fact Sheet for Patients: EntrepreneurPulse.com.au  Fact Sheet for Healthcare Providers: IncredibleEmployment.be  This test is not yet approved or cleared by the [place] FDA and has been authorized for detection and/or diagnosis of SARS-CoV-2 by FDA under an Emergency Use Authorization (EUA). This EUA will remain in effect (meaning this test can be used) for the duration of the COVID-19 declaration under Section 564(b)(1) of the Act, 21 U.S.C. section 360bbb-3(b)(1), unless the authorization is terminated or revoked.     Resp Syncytial Virus by PCR NEGATIVE NEGATIVE Final    Comment:  (NOTE) Fact Sheet for Patients: EntrepreneurPulse.com.au  Fact Sheet for Healthcare Providers: IncredibleEmployment.be  This test is not yet approved or cleared by the [place] FDA and has been authorized for detection and/or diagnosis of SARS-CoV-2 by FDA under an Emergency Use Authorization (EUA). This EUA will remain in effect (meaning this test can be used) for the duration of the COVID-19 declaration under Section 564(b)(1) of the Act, 21 U.S.C. section 360bbb-3(b)(1), unless the authorization is terminated or revoked.  Performed at [facility], [address] [place], [place] [address]   Culture, blood (x 2)     Status: None (Preliminary result)   Collection Time: 06/21/22  1:20 PM   Specimen: BLOOD  Result Value Ref Range Status   Specimen Description BLOOD BLOOD RIGHT ARM  Final   Special Requests   Final    BOTTLES DRAWN AEROBIC AND ANAEROBIC Blood Culture adequate volume   Culture   Final    NO GROWTH < 24 HOURS Performed at [facility], [address] [place], [place] [address]    Report Status PENDING  Incomplete  Culture, blood (x 2)     Status: None (Preliminary result)   Collection Time: 06/21/22  1:20 PM   Specimen: BLOOD  Result Value Ref Range Status   Specimen Description BLOOD BLOOD RIGHT HAND  Final   Special Requests   Final    BOTTLES DRAWN AEROBIC AND ANAEROBIC Blood Culture results may not be optimal due to an excessive volume of blood received in culture bottles   Culture   Final    NO GROWTH < 24 HOURS Performed at [facility], [address] [place], [place] [address]    Report Status PENDING  Incomplete  MRSA Next Gen by PCR, Nasal     Status: None   Collection Time: 06/21/22  1:57 PM   Specimen: Nasal Mucosa; Nasal Swab  Result Value Ref Range Status   MRSA by PCR Next Gen NOT DETECTED NOT DETECTED Final    Comment: (NOTE) The GeneXpert MRSA Assay (FDA approved for  NASAL specimens only), is one component of a comprehensive MRSA colonization surveillance program. It is not intended to diagnose MRSA infection nor to guide or monitor treatment for MRSA infections. Test performance is not FDA approved in patients less than 42 years old. Performed at [facility], [address] [place], [place] [address]    *Note: Due to a large number of results and/or encounters for the requested time period, some results have not been displayed. A complete set of results can be found in Results Review.    Coagulation Studies: Recent Labs    06/21/22 0848  LABPROT 19.8*  INR 1.7*    Urinalysis: Recent Labs    06/21/22 0848  COLORURINE RED*  LABSPEC 1.014  PHURINE TEST NOT REPORTED DUE TO COLOR INTERFERENCE OF URINE PIGMENT  GLUCOSEU TEST NOT REPORTED DUE TO COLOR INTERFERENCE OF URINE PIGMENT*  HGBUR TEST NOT REPORTED DUE TO COLOR INTERFERENCE OF URINE PIGMENT*  BILIRUBINUR TEST NOT REPORTED DUE TO COLOR INTERFERENCE OF URINE PIGMENT*  KETONESUR TEST NOT REPORTED DUE TO COLOR INTERFERENCE OF URINE PIGMENT*  PROTEINUR TEST NOT REPORTED DUE TO COLOR INTERFERENCE OF URINE PIGMENT*  NITRITE TEST NOT REPORTED DUE TO COLOR INTERFERENCE OF URINE PIGMENT*  LEUKOCYTESUR TEST NOT REPORTED DUE TO COLOR INTERFERENCE OF URINE PIGMENT*      Imaging: US Renal Transplant w/Doppler  Result Date: 06/21/2022 CLINICAL DATA:  Acute kidney failure, urinary tract infection EXAM: ULTRASOUND OF RENAL TRANSPLANT WITH RENAL DOPPLER ULTRASOUND TECHNIQUE: Ultrasound examination of the renal transplant was performed with gray-scale, color and duplex doppler evaluation. COMPARISON:  CT today FINDINGS: Transplant kidney location: Right lower quadrant Transplant Kidney: Renal measurements: 14.0 x 6.7 x 6.1 cm = volume: 264mL. Normal size and echotexture. Mild hydronephrosis in the lower pole. Color flow in the main renal artery:  Yes Color flow in the main renal vein:  Yes  Duplex Doppler Evaluation: Main Renal Artery Resistive Index: 0.84 Venous waveform in main renal vein:  Present Intrarenal resistive index in upper pole:  0.79 (normal 0.6-0.8; equivocal 0.8-0.9; abnormal >= 0.9) Intrarenal resistive index in lower pole: 0.8 (normal 0.6-0.8; equivocal 0.8-0.9; abnormal >= 0.9) Bladder: Not visualized Other findings:  None. IMPRESSION: Mild fullness of the collecting system in the lower pole of the right lower quadrant renal transplant. Electronically Signed   By: Rolm Baptise M.D.   On: 06/21/2022 18:34   CT CHEST ABDOMEN PELVIS WO CONTRAST  Result Date: 06/21/2022 CLINICAL DATA:  Fever with hematuria.  Status post renal transplant. EXAM: CT CHEST, ABDOMEN AND PELVIS WITHOUT CONTRAST TECHNIQUE: Multidetector CT imaging of the chest, abdomen and pelvis was performed following the standard protocol without IV contrast. RADIATION DOSE REDUCTION: This exam was performed according to the departmental dose-optimization program which includes automated exposure control, adjustment of the mA and/or kV according to patient size and/or use of iterative reconstruction technique. COMPARISON:  CT stone study 01/03/2022. FINDINGS: CT CHEST FINDINGS Cardiovascular: The heart is enlarged. Coronary artery calcification is evident. Status post CABG. Mild atherosclerotic calcification is noted in the wall of the thoracic aorta. Enlargement of the pulmonary outflow tract/main pulmonary arteries suggests pulmonary arterial hypertension. Mediastinum/Nodes: Densely calcified right paratracheal lymph node or nodule is stable since 07/01/2019 consistent with benign etiology. Calcified nodal tissue is seen in the right hilum. The esophagus has normal imaging features. There is no axillary lymphadenopathy. Lungs/Pleura: Fine architectural detail of the lungs is obscured bilaterally by breathing motion. 2.9 x 1.0 cm nodular consolidative opacity in the right lower lobe (86/4) is stable since prior. This is  stable since chest CT 07/01/2019 consistent with benign etiology. No new suspicious pulmonary nodule or mass. No focal airspace consolidation. No pleural effusion. Musculoskeletal: No worrisome lytic or sclerotic osseous abnormality. CT ABDOMEN PELVIS FINDINGS Hepatobiliary: No suspicious focal abnormality in the liver on this study without intravenous contrast. Subtle nodularity of liver contour suggests cirrhosis. Gallbladder is not visualized. No intrahepatic or extrahepatic biliary dilation. Pancreas: No focal mass lesion. No dilatation of the main duct. No intraparenchymal cyst. No peripancreatic edema. Spleen: Mild splenomegaly. Adrenals/Urinary Tract: No adrenal nodule or mass. Native kidneys are atrophic bilaterally. Small exophytic cyst lower pole right kidney is unchanged. No followup imaging is recommended. 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 No hydronephrosis in the upper pole collecting system although inferior moiety demonstrates mild hydronephrosis, new in the interval with mild fullness of the ureter of the lower pole Mighty down to the level of the ureteral confluence. Bladder is nondistended which likely accentuates the mild circumferential bladder wall thickening. Stomach/Bowel: Surgical changes in the stomach compatible with sleeve gastrectomy. Duodenum is normally positioned as is the ligament of Treitz. No small bowel wall thickening. No small bowel dilatation. The terminal ileum is normal. The appendix is normal. No gross colonic mass. No colonic wall thickening. Vascular/Lymphatic: There is mild atherosclerotic calcification of the abdominal aorta without aneurysm. There is no gastrohepatic or hepatoduodenal ligament lymphadenopathy. No retroperitoneal or mesenteric lymphadenopathy. No pelvic sidewall lymphadenopathy. Reproductive: Sequelae of urolift procedure noted in the prostate. Other: No intraperitoneal free fluid. Musculoskeletal: No worrisome  lytic or sclerotic osseous abnormality. IMPRESSION: 1. No acute findings in the chest. 2. 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 No hydronephrosis in the upper pole collecting system although inferior moiety demonstrates mild hydronephrosis, new in the interval with mild fullness of the lower pole ureter to the level of the ureteral confluence. No obstructing stone or mass lesion evident. 3. Enlargement of the pulmonary outflow tract/main pulmonary arteries suggests pulmonary arterial hypertension. 4. Stable 2.9 x 1.0 cm nodular consolidative opacity in the right lower lobe since chest CT 07/01/2019 consistent with benign etiology, potentially scarring. 5. Subtle changes in the liver suggesting cirrhosis and stable mild splenomegaly would be compatible with portal venous hypertension. 6.  Aortic Atherosclerosis (ICD10-I70.0). Electronically Signed   By: Misty Stanley M.D.   On: 06/21/2022 10:01   CT  HEAD WO CONTRAST (5MM)  Result Date: 06/21/2022 CLINICAL DATA:  Confusion.  Headache. EXAM: CT HEAD WITHOUT CONTRAST TECHNIQUE: Contiguous axial images were obtained from the base of the skull through the vertex without intravenous contrast. RADIATION DOSE REDUCTION: This exam was performed according to the departmental dose-optimization program which includes automated exposure control, adjustment of the mA and/or kV according to patient size and/or use of iterative reconstruction technique. COMPARISON:  11/05/2018 FINDINGS: Brain: There is no evidence for acute hemorrhage, hydrocephalus, mass lesion, or abnormal extra-axial fluid collection. No definite CT evidence for acute infarction. Patchy low attenuation in the deep hemispheric and periventricular white matter is nonspecific, but likely reflects chronic microvascular ischemic demyelination. Vascular: No hyperdense vessel or unexpected calcification. Skull: No evidence for fracture. No  worrisome lytic or sclerotic lesion. Sinuses/Orbits: The visualized paranasal sinuses and right mastoid air cells are clear. Stable trace inferior left mastoid effusion. Visualized portions of the globes and intraorbital fat are unremarkable. Other: None. IMPRESSION: 1. No acute intracranial abnormality. 2. Chronic small vessel ischemic disease. 3. Stable trace inferior left mastoid effusion. Electronically Signed   By: Misty Stanley M.D.   On: 06/21/2022 09:36   DG Chest Portable 1 View  Result Date: 06/21/2022 CLINICAL DATA:  Shortness of breath, fever, mild confused, blood in urine, rolling and leaning to LEFT side, past history of renal transplant EXAM: PORTABLE CHEST 1 VIEW COMPARISON:  Portable exam 0850 hours compared to 04/02/2022 FINDINGS: Enlargement of cardiac silhouette post CABG. Mediastinal contours and pulmonary vascularity normal. Atherosclerotic calcification aorta. Large calcified RIGHT paratracheal lymph node stable. Chronic elevation/eventration of RIGHT diaphragm with mild RIGHT basilar atelectasis. Small nodular foci in the LEFT mid lung appear more prominent than on previous exam. Remaining lungs clear. No pleural effusion or pneumothorax. Osseous structures unremarkable. IMPRESSION: Enlargement of cardiac silhouette post CABG. Old granulomatous disease with multiple small nodular foci in the LEFT mid lung, more prominent than on previous exam; potentially this could represent a an atypical infiltrate in a transplant patient but CT chest recommended to exclude developing pulmonary nodules. Electronically Signed   By: Lavonia Dana M.D.   On: 06/21/2022 09:03     Medications:    ceFEPime (MAXIPIME) IV Stopped (06/21/22 2210)   lactated ringers 75 mL/hr at 06/22/22 [id number]    apixaban  5 mg Oral BID   buPROPion  150 mg Oral BID   Chlorhexidine Gluconate Cloth  6 each Topical Daily   insulin aspart  0-15 Units Subcutaneous Q4H   mycophenolate  360 mg Oral BID   predniSONE  5 mg Oral  Daily   sulfamethoxazole-trimethoprim  1 tablet Oral Q M,W,F   tacrolimus  2 mg Oral BID   acetaminophen, budesonide (PULMICORT) nebulizer solution, ondansetron **OR** ondansetron (ZOFRAN) IV  Assessment/ Plan:  Mr. Matrix Schwind. is a 62 y.o. Hispanic male renal transplant in 2012, coronary artery disease status post CABG, atrial fibrillation, congestive heart failure, gout, GERD, hyperlipidemia, pulmonary hypertension, status post gastric bypass, diabetes mellitus type II, who was admitted to [facility] on 06/21/2022 for Pyelonephritis [N12] Acute respiratory failure with hypoxia [J96.01] Sepsis [A41.9] Sepsis, due to unspecified organism, unspecified whether acute organ dysfunction present [A41.9]    Acute kidney injury on chronic kidney disease stage IIIA-T: baseline creatinine of 1.63, GFR of 47 on 05/30/22. Followed by [facility]. Transplant doppler reviewed with patient.  - Pending tacrolimus trough level. Continue tacrolimus 2g PO bid.  - continue mycophenolate and prednisone - Continue PJP prophylaxis with Bactrim  Pyelonephritis/urinary tract infection/sepsis: cultures currently with no growth - empiric antibiotics: cefepime   Hypotension: with septic shock. Not requiring vasopressors. Holding home blood pressure regimen: hydralazine, isosorbide mononitrate, metolazone, tamsulosin and torsemide. - on IV F: LR at 61mL/hr - monitor volume status due to history of systolic congestive heart failure, echo with EF of 40-45% in 04/02/22.    Diabetes mellitus type II with chronic kidney disease: insulin dependent. Hemoglobin A1c of 7.7% on 05/15/22. Holding dapagliflozin.    LOS: 1 Adahlia Stembridge 3/30/202410:05 AM

## 2022-06-22 NOTE — Progress Notes (Signed)
Progress Note  Patient Name: [name]. Date of Encounter: 06/22/2022  Primary Cardiologist: Fletcher Anon Primary Electrophysiologist:  None Advanced Heart Failure: Aundra Dubin  Subjective   No angina or dyspnea. No acute overnight cardiac events.   Inpatient Medications    Scheduled Meds:  apixaban  5 mg Oral BID   buPROPion  150 mg Oral BID   Chlorhexidine Gluconate Cloth  6 each Topical Daily   insulin aspart  0-15 Units Subcutaneous Q4H   mycophenolate  360 mg Oral BID   predniSONE  5 mg Oral Daily   sulfamethoxazole-trimethoprim  1 tablet Oral Q M,W,F   Continuous Infusions:  ceFEPime (MAXIPIME) IV Stopped (06/21/22 2210)   lactated ringers 75 mL/hr at 06/22/22 0328   PRN Meds: acetaminophen, budesonide (PULMICORT) nebulizer solution, ondansetron **OR** ondansetron (ZOFRAN) IV   Vital Signs    Vitals:   06/22/22 0400 06/22/22 0500 06/22/22 0600 06/22/22 0700  BP: 118/60 (!) 118/57 120/62 (!) 139/96  Pulse: 87 74 87 92  Resp: (!) 23 (!) 23 (!) 23 (!) 23  Temp:   98.3 F (36.8 C)   TempSrc:   Oral   SpO2: 96% 100% 93% 100%  Weight:      Height:        Intake/Output Summary (Last 24 hours) at 06/22/2022 0816 Last data filed at 06/22/2022 0545 Gross per 24 hour  Intake 1401.43 ml  Output 1850 ml  Net -448.57 ml   Filed Weights   06/21/22 0840  Weight: 124.3 kg    Telemetry    Afib, 60s to 70s bpm with PVCs vs aberrancy - Personally Reviewed  ECG    No new tracings - Personally Reviewed  Physical Exam   GEN: No acute distress.   Neck: No JVD. Cardiac: IRIR, no murmurs, rubs, or gallops.  Respiratory: Clear to auscultation bilaterally.  GI: Soft, nontender, non-distended.   MS: No edema; No deformity. Neuro:  Alert and oriented x 3; Nonfocal.  Psych: Normal affect.  Labs    Chemistry Recent Labs  Lab 06/21/22 0830 06/22/22 0233  NA 140 139  K 4.0 4.0  CL 98 99  CO2 32 28  GLUCOSE 93 96  BUN 63* 60*  CREATININE 2.18* 2.20*   CALCIUM 9.1 8.8*  PROT 7.1 6.5  ALBUMIN 3.4* 2.9*  AST 25 27  ALT 14 14  ALKPHOS 114 91  BILITOT 1.3* 1.6*  GFRNONAA 33* 33*  ANIONGAP 10 12     Hematology Recent Labs  Lab 06/21/22 0830 06/22/22 0233  WBC 10.9* 10.1  RBC 5.35 4.83  HGB 12.3* 11.2*  HCT 41.9 38.2*  MCV 78.3* 79.1*  MCH 23.0* 23.2*  MCHC 29.4* 29.3*  RDW 19.9* 19.9*  PLT 142* 108*    Cardiac EnzymesNo results for input(s): "TROPONINI" in the last 168 hours. No results for input(s): "TROPIPOC" in the last 168 hours.   BNP Recent Labs  Lab 06/21/22 0848  BNP 465.0*     DDimer No results for input(s): "DDIMER" in the last 168 hours.   Radiology    US Renal Transplant w/Doppler  Result Date: 06/21/2022 IMPRESSION: Mild fullness of the collecting system in the lower pole of the right lower quadrant renal transplant. Electronically Signed   By: Rolm Baptise M.D.   On: 06/21/2022 18:34   CT CHEST ABDOMEN PELVIS WO CONTRAST  Result Date: 06/21/2022 IMPRESSION: 1. No acute findings in the chest. 2. 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 No hydronephrosis in the upper pole collecting system although inferior moiety demonstrates mild hydronephrosis, new in the interval with mild fullness of the lower pole ureter to the level of the ureteral confluence. No obstructing stone or mass lesion evident. 3. Enlargement of the pulmonary outflow tract/main pulmonary arteries suggests pulmonary arterial hypertension. 4. Stable 2.9 x 1.0 cm nodular consolidative opacity in the right lower lobe since chest CT 07/01/2019 consistent with benign etiology, potentially scarring. 5. Subtle changes in the liver suggesting cirrhosis and stable mild splenomegaly would be compatible with portal venous hypertension. 6.  Aortic Atherosclerosis (ICD10-I70.0). Electronically Signed   By: Misty Stanley M.D.   On: 06/21/2022 10:01   CT HEAD WO CONTRAST  (5MM)  Result Date: 06/21/2022 IMPRESSION: 1. No acute intracranial abnormality. 2. Chronic small vessel ischemic disease. 3. Stable trace inferior left mastoid effusion. Electronically Signed   By: Misty Stanley M.D.   On: 06/21/2022 09:36   DG Chest Portable 1 View  Result Date: 06/21/2022 IMPRESSION: Enlargement of cardiac silhouette post CABG. Old granulomatous disease with multiple small nodular foci in the LEFT mid lung, more prominent than on previous exam; potentially this could represent a an atypical infiltrate in a transplant patient but CT chest recommended to exclude developing pulmonary nodules. Electronically Signed   By: Lavonia Dana M.D.   On: 06/21/2022 09:03    Cardiac Studies     Patient Profile     62 y.o. male with history of CAD status post CABG in 2005, permanent A-fib on apixaban, ESRD status post renal transplant in 2012 with subsequent CKD stage IIIb, HFmrEF, pulmonary hypertension, HTN, HLD, and obesity s/p gastric bypass who is being seen today for the evaluation of history of cardiomyopathy at the request of Dr. Ernestina Patches.   Assessment & Plan    HFmrEF:  -Appears euvolemic and well compensated  -No longer on beta-blocker secondary to bradycardia  -No longer on ARB or spironolactone secondary to renal dysfunction  -Farxiga and torsemide held upon admission given acute pyelonephritis with renal dysfunction, resume as able  -Recommend stopping IV fluids in an effort to reduce risk of volume overload, if ok with primary servic  Permanent A-fib:  -Controlled ventricular response  -Not on beta-blocker secondary to bradycardia  -PTA apixaban, does not meet reduced dosing criteria   CAD status post CABG with mildly elevated high-sensitivity troponin:  -No symptoms suggestive of angina  -Initial high-sensitivity troponin mildly elevated at 97, not cycled  -Nuclear stress test in 2022 with prior MI without evidence of ischemia  -Would have a high threshold for  cardiac cath given underlying CKD  -PTA apixaban in place of aspirin  -PTA rosuvastatin   Acute pyelonephritis with CKD stage IIIb status post renal transplantation in 2012:  -Avoid nephrotoxic agents  -Management per primary service   Pulmonary hypertension:  -Primarily pulmonary venous hypertension by [facility] in 03/2022 with possible group 3 pulm hypertension from OSA  -No longer on tadalafil   -Outpatient follow-up with advanced heart failure service   HTN:  -Blood pressure improved and well controlled    For questions or updates, please contact [facility] Please consult [email or web address] for contact info under Cardiology/STEMI.    Signed, Christell Faith, PA-C [facility] Pager: [phone] 06/22/2022, 8:16 AM

## 2022-06-23 DIAGNOSIS — I255 Ischemic cardiomyopathy: Secondary | ICD-10-CM | POA: Diagnosis not present

## 2022-06-23 DIAGNOSIS — N12 Tubulo-interstitial nephritis, not specified as acute or chronic: Secondary | ICD-10-CM | POA: Diagnosis not present

## 2022-06-23 DIAGNOSIS — I2510 Atherosclerotic heart disease of native coronary artery without angina pectoris: Secondary | ICD-10-CM | POA: Diagnosis not present

## 2022-06-23 LAB — CBC
HCT: 35.7 % — ABNORMAL LOW (ref 39.0–52.0)
Hemoglobin: 10.5 g/dL — ABNORMAL LOW (ref 13.0–17.0)
MCH: 23.2 pg — ABNORMAL LOW (ref 26.0–34.0)
MCHC: 29.4 g/dL — ABNORMAL LOW (ref 30.0–36.0)
MCV: 78.8 fL — ABNORMAL LOW (ref 80.0–100.0)
Platelets: 91 10*3/uL — ABNORMAL LOW (ref 150–400)
RBC: 4.53 MIL/uL (ref 4.22–5.81)
RDW: 19.6 % — ABNORMAL HIGH (ref 11.5–15.5)
WBC: 4.5 10*3/uL (ref 4.0–10.5)
nRBC: 0 % (ref 0.0–0.2)

## 2022-06-23 LAB — URINE CULTURE: Culture: >=100000 — AB

## 2022-06-23 LAB — BASIC METABOLIC PANEL
Anion gap: 9 (ref 5–15)
BUN: 63 mg/dL — ABNORMAL HIGH (ref 8–23)
CO2: 29 mmol/L (ref 22–32)
Calcium: 9 mg/dL (ref 8.9–10.3)
Chloride: 101 mmol/L (ref 98–111)
Creatinine, Ser: 2.36 mg/dL — ABNORMAL HIGH (ref 0.61–1.24)
GFR, Estimated: 30 mL/min — ABNORMAL LOW (ref >60)
Glucose, Bld: 125 mg/dL — ABNORMAL HIGH (ref 70–99)
Potassium: 3.5 mmol/L (ref 3.5–5.1)
Sodium: 139 mmol/L (ref 135–145)

## 2022-06-23 LAB — GLUCOSE, CAPILLARY
Glucose-Capillary: 112 mg/dL — ABNORMAL HIGH (ref 70–99)
Glucose-Capillary: 118 mg/dL — ABNORMAL HIGH (ref 70–99)
Glucose-Capillary: 115 mg/dL — ABNORMAL HIGH (ref 70–99)
Glucose-Capillary: 252 mg/dL — ABNORMAL HIGH (ref 70–99)
Glucose-Capillary: 351 mg/dL — ABNORMAL HIGH (ref 70–99)
Glucose-Capillary: 209 mg/dL — ABNORMAL HIGH (ref 70–99)

## 2022-06-23 LAB — MAGNESIUM: Magnesium: 2.3 mg/dL (ref 1.7–2.4)

## 2022-06-23 LAB — PHOSPHORUS: Phosphorus: 4.4 mg/dL (ref 2.5–4.6)

## 2022-06-23 MED ORDER — POTASSIUM CHLORIDE 10 MEQ/100ML IV SOLN
10.0000 meq | INTRAVENOUS | Status: DC
  Filled 2022-06-23 (×4): qty 100

## 2022-06-23 MED ORDER — POTASSIUM CHLORIDE CRYS ER 20 MEQ PO TBCR
20.0000 meq | EXTENDED_RELEASE_TABLET | Freq: Once | ORAL | Status: AC
  Administered 2022-06-23: 20 meq via ORAL
  Filled 2022-06-23: qty 1

## 2022-06-23 MED ORDER — SACCHAROMYCES BOULARDII 250 MG PO CAPS
250.0000 mg | ORAL_CAPSULE | Freq: Two times a day (BID) | ORAL | Status: DC
  Administered 2022-06-23 – 2022-06-28 (×11): 250 mg via ORAL
  Filled 2022-06-23 (×11): qty 1

## 2022-06-23 MED ORDER — POTASSIUM CHLORIDE 10 MEQ/100ML IV SOLN
10.0000 meq | INTRAVENOUS | Status: DC
  Administered 2022-06-23: 10 meq via INTRAVENOUS

## 2022-06-23 NOTE — Progress Notes (Signed)
[facility] Progress Note  Patient: [name].    YA:[id number]  DOA: 06/21/2022     Date of Service: the patient was seen and examined on 06/23/2022  Chief Complaint  Patient presents with   Shortness of Breath   Fever   Hematuria   Brief hospital course: [name]. is a 62 y.o. male with PMH of CAD s/p CABG in 2005, ischemic cardiomyopathy LVEF 40 to 45% as per echo Jan 2024, pulmonary hypertension, chronic A. Fib on Eliquis, aortic stenosis, HTN, type 2 DM, ESRD s/p renal transplant in 2012 now with stage III CKD, OSA on CPAP, gout presenting with sepsis, pyelonephritis.  ED w/up: Tmax 101.5, HR 100s, BP 80s to 100s over 50s, on 2 L nasal cannula to keep O2 sats greater than 92%.  Wbx 10.9, Plts 142, LA 2.1---1.8, roponin 90s to 120s, COVID flu and RSV negative. BNP 465. Creatinine 2.2.  CT head grossly stable. CT ch/abd/p: hydronephrosis of transplanted kidney. Noted 2.9 x 1 cm nodular Consolidative opacity in the right lower lobe present since 2021. Changes in liver consistent with cirrhosis splenomegaly with some concern for portal vein hypertension.   Assessment and Plan:  Sepsis, Serratia bacteremia 2/2 pyelonephritis and possible atypical pneumonia Sepsis criteria Tmax 101.5, heart rate 100, hypotensive, WBC elevated, pyelonephritis, lactic acid 2.1, hypoxic respiratory failure S/p IV fluid, DC'd due to pulmonary edema and respiratory failure   Pyelonephritis Baseline history of end-stage renal disease status post renal transplant on chronic immunosuppression  Pending tacrolimus trough level. Continue tacrolimus 2g PO bid.  Continue mycophenolate and prednisone Continue PJP prophylaxis with Bactrim S/p vancomycin and Flagyl Continue cefepime 2 g every 12 hourly Urine cx: Serratia marcescens  Blood culture: Serratia marcescens, sensitive to cefepime Follow repeat blood cultures  We will consult ID on Monday for antibiotics   Acute hypoxic  respiratory failure, multifactorial could be due to pneumonia versus CHF Started empiric azithromycin 500 mg IV daily S/p Lasix 60 mg x 1 dose, started torsemide 100 mg p.o. twice daily Continue diuresis as per Nephrology and Cardiology Continue supplemental O2 inhalation and gradually wean off  CAD (coronary artery disease) S/p CABG in 2005 No active chest pain though with troponins in the 90s on presentation Suspect demand ischemia in the setting of sepsis and pyelonephritis A-fib with RVR on EKG Continue Eliquis Continue telemetry monitor Trop 97--126  consulted cardiology    Chronic systolic congestive heart failure  2D echo January 2024 with LVEF of 40-45%  January 2023 admission does mention episode of cardiorenal syndrome assd w/ diuresis  Noted to be followed by the heart failure clinic Dry weight at hospital dc 03/2022 was 116kg  BNP 465 elevated  Continue torsemide 100 mg p.o. twice daily Cardiology following   Essential hypertension BP 100s/50s in setting of sepsis, so BP meds were held Blood pressure gradually improving     H/o asthma, patient has shortness of breath most likely due to pneumonia and CHF Continue management as above Started DuoNeb every 6 hourly as needed   Type II diabetes mellitus with renal manifestations SSI  A1C     GERD (gastroesophageal reflux disease) PPI   Pulmonary hypertension due to left heart disease Primarily venous hypertension by [place] 03/2021 admission,  On imdur which has been held due to low blood pressure Cardiology following   Body mass index is 39.31 kg/m.  Interventions:       Diet: Heart healthy/carb modified diet DVT Prophylaxis: Therapeutic Anticoagulation with  Eliquis    Advance goals of care discussion: Full code  Family Communication: family was present at bedside, at the time of interview.  The pt provided permission to discuss medical plan with the family. Opportunity was given to ask question and  all questions were answered satisfactorily.   Disposition:  Pt is from Home, admitted with sepsis, pyelonephritis, still on IV antibiotics, need to repeat blood cultures, which precludes a safe discharge. Discharge to home, when clinically stable.  May need few days to improve.  Subjective: No significant events overnight, still has mild shortness of breath, but feeling improvement, denies any headache or dizziness, no chest pain or palpitation, no abdominal pain.   Physical Exam: General: NAD, lying comfortably, mild respiratory distress Eyes: PERRLA ENT: Oral Mucosa Clear, moist  Neck: no JVD,  Cardiovascular: S1 and S2 Present, no Murmur,  Respiratory: Good air entry bilaterally, increased respiratory efforts, bilateral crackles, no wheezing appreciated Abdomen: Bowel Sound present, Soft and no tenderness,  Skin: no rashes Extremities: no Pedal edema, no calf tenderness, chronic venous stasis pigmentation Neurologic: without any new focal findings Gait not checked due to patient safety concerns  Vitals:   06/23/22 0700 06/23/22 0800 06/23/22 0900 06/23/22 1355  BP: 114/60 136/75 134/63 113/75  Pulse: 77 86 70 86  Resp: 16 17 (!) 21 16  Temp:    98.2 F (36.8 C)  TempSrc:    Oral  SpO2: 97% 96% 96% 93%  Weight:      Height:        Intake/Output Summary (Last 24 hours) at 06/23/2022 1525 Last data filed at 06/23/2022 1108 Gross per 24 hour  Intake 512.51 ml  Output 1775 ml  Net -1262.49 ml   Filed Weights   06/21/22 0840  Weight: 124.3 kg    Data Reviewed: I have personally reviewed and interpreted daily labs, tele strips, imagings as discussed above. I reviewed all nursing notes, pharmacy notes, vitals, pertinent old records I have discussed plan of care as described above with RN and patient/family.  CBC: Recent Labs  Lab 06/21/22 0830 06/22/22 0233 06/23/22 0500  WBC 10.9* 10.1 4.5  NEUTROABS 11.1*  --   --   HGB 12.3* 11.2* 10.5*  HCT 41.9 38.2* 35.7*   MCV 78.3* 79.1* 78.8*  PLT 142* 108* 91*   Basic Metabolic Panel: Recent Labs  Lab 06/21/22 0830 06/22/22 0230 06/22/22 0233 06/23/22 0500  NA 140  --  139 139  K 4.0  --  4.0 3.5  CL 98  --  99 101  CO2 32  --  28 29  GLUCOSE 93  --  96 125*  BUN 63*  --  60* 63*  CREATININE 2.18*  --  2.20* 2.36*  CALCIUM 9.1  --  8.8* 9.0  MG  --  2.0  --  2.3  PHOS  --  2.8  --  4.4    Studies: No results found.  Scheduled Meds:  apixaban  5 mg Oral BID   buPROPion  150 mg Oral BID   Chlorhexidine Gluconate Cloth  6 each Topical Daily   insulin aspart  0-15 Units Subcutaneous Q4H   mycophenolate  360 mg Oral BID   predniSONE  5 mg Oral Daily   rosuvastatin  10 mg Oral QHS   saccharomyces boulardii  250 mg Oral BID   sulfamethoxazole-trimethoprim  1 tablet Oral Q M,W,F   tacrolimus  2 mg Oral BID   torsemide  100 mg Oral BID  Continuous Infusions:  azithromycin 500 mg (06/23/22 1108)   ceFEPime (MAXIPIME) IV 2 g (06/23/22 1028)   PRN Meds: acetaminophen, budesonide (PULMICORT) nebulizer solution, ipratropium-albuterol, ondansetron **OR** ondansetron (ZOFRAN) IV  Time spent: 55 minutes  Author: Val Riles. MD [facility] 06/23/2022 3:25 PM  To reach On-call, see care teams to locate the attending and reach out to them via [email or web address]. If 7PM-7AM, please contact night-coverage If you still have difficulty reaching the attending provider, please page the [facility] (Director on Call) for [facility] on amion for assistance.

## 2022-06-23 NOTE — Consult Note (Signed)
Pharmacy Antibiotic Note  [name]. is a 62 y.o. male with PMH including CKD s/p renal transplant, CAD, HTN, HLD, gout, asthma, Afib, BPH with LUTS, combined systolic and diastolic CHF, GERD, obesity, DM, PAH, depression admitted on 06/21/2022 with  pyelonephritis . Code sepsis activated in the ED. Pharmacy has been consulted for cefepime dosing.  -also on Azithromycin - Bactrim for PJP prophylaxis -on Tacromilus and mycophenolate for renal transplant -Bcx 3/29: 1 of 4 (aerobic) bottles= GNR (BCID=Serratia marcescens) -Ucx 3/29: Serratia Marcescens: (watch-AmpC producer?)  Plan:  Cefepime 2 g IV q12h  for Crcl 42.9 ml/min  Follow-up cultures, clinical course, duration (patient is immunocompromised status)  Height: 5\' 10"  (177.8 cm) Weight: 124.3 kg (274 lb) IBW/kg (Calculated) : 73  Temp (24hrs), Avg:98.2 F (36.8 C), Min:97.9 F (36.6 C), Max:98.5 F (36.9 C)  Recent Labs  Lab 06/21/22 0830 06/21/22 0839 06/21/22 1119 06/22/22 0233 06/23/22 0500  WBC 10.9*  --   --  10.1 4.5  CREATININE 2.18*  --   --  2.20* 2.36*  LATICACIDVEN  --  2.1* 1.8  --   --      Estimated Creatinine Clearance: 42.9 mL/min (A) (by C-G formula based on SCr of 2.36 mg/dL (H)).    Allergies  Allergen Reactions   Contrast Media [Iodinated Contrast Media] Other (See Comments)    Kidney transplant   Iodine Other (See Comments)    Other reaction(s): Other (See Comments) Kidney transplant   Ibuprofen Other (See Comments)    Due to kidney transplant Due to kidney transplant Due to kidney transplant    Antimicrobials this admission: Vancomycin 3/29 x 1 Metronidazole 3/29 x 1 Cefepime 3/29 >>   Dose adjustments this admission: N/A  Microbiology results: -Bcx 3/29: 1 of 4 (aerobic) bottles= GNR (BCID=Serratia marcescens) -Ucx 3/29: Serratia Marcescens:  Sensitive to Cefepime, ceftriaxone, gentamicin,  Intermediate to Cipro, Resistant to nitrofurantoin, bactrim  Thank you for  allowing pharmacy to be a part of this patient's care.  [name] A 06/23/2022 9:57 AM

## 2022-06-23 NOTE — Progress Notes (Signed)
Rounding Note    Patient Name: [name]. Date of Encounter: 06/23/2022  [facility] Cardiologist: [name], MD   Subjective   No acute events overnight, feeling better this morning, net -1.5 L over the past 24 hours, kidney function stable.  Inpatient Medications    Scheduled Meds:  apixaban  5 mg Oral BID   buPROPion  150 mg Oral BID   Chlorhexidine Gluconate Cloth  6 each Topical Daily   insulin aspart  0-15 Units Subcutaneous Q4H   mycophenolate  360 mg Oral BID   predniSONE  5 mg Oral Daily   rosuvastatin  10 mg Oral QHS   sulfamethoxazole-trimethoprim  1 tablet Oral Q M,W,F   tacrolimus  2 mg Oral BID   torsemide  100 mg Oral BID   Continuous Infusions:  azithromycin 500 mg (06/23/22 1108)   ceFEPime (MAXIPIME) IV 2 g (06/23/22 1028)   potassium chloride     PRN Meds: acetaminophen, budesonide (PULMICORT) nebulizer solution, ipratropium-albuterol, ondansetron **OR** ondansetron (ZOFRAN) IV   Vital Signs    Vitals:   06/23/22 0600 06/23/22 0700 06/23/22 0800 06/23/22 0900  BP: 127/72 114/60 136/75 134/63  Pulse: 92 77 86 70  Resp: 19 16 17  (!) 21  Temp:      TempSrc:      SpO2: 90% 97% 96% 96%  Weight:      Height:        Intake/Output Summary (Last 24 hours) at 06/23/2022 1242 Last data filed at 06/23/2022 1108 Gross per 24 hour  Intake 512.51 ml  Output 1775 ml  Net -1262.49 ml      06/21/2022    8:40 AM 05/31/2022    8:30 AM 05/30/2022   10:25 AM  Last 3 Weights  Weight (lbs) 274 lb 254 lb 260 lb 6.4 oz  Weight (kg) 124.286 kg 115.214 kg 118.117 kg      Telemetry    Atrial fibrillation- Personally Reviewed  ECG     - Personally Reviewed  Physical Exam   GEN: No acute distress.   Neck: No JVD Cardiac: Irregular irregular Respiratory: Diminished breath sounds, no wheezing GI: Soft, nontender, non-distended  MS: Trace edema; No deformity. Neuro:  Nonfocal  Psych: Normal affect   Labs    High  Sensitivity Troponin:   Recent Labs  Lab 06/21/22 0839 06/21/22 0957  TROPONINIHS 97* 126*     Chemistry Recent Labs  Lab 06/21/22 0830 06/22/22 0230 06/22/22 0233 06/23/22 0500  NA 140  --  139 139  K 4.0  --  4.0 3.5  CL 98  --  99 101  CO2 32  --  28 29  GLUCOSE 93  --  96 125*  BUN 63*  --  60* 63*  CREATININE 2.18*  --  2.20* 2.36*  CALCIUM 9.1  --  8.8* 9.0  MG  --  2.0  --  2.3  PROT 7.1  --  6.5  --   ALBUMIN 3.4*  --  2.9*  --   AST 25  --  27  --   ALT 14  --  14  --   ALKPHOS 114  --  91  --   BILITOT 1.3*  --  1.6*  --   GFRNONAA 33*  --  33* 30*  ANIONGAP 10  --  12 9    Lipids No results for input(s): "CHOL", "TRIG", "HDL", "LABVLDL", "LDLCALC", "CHOLHDL" in the last 168 hours.  Hematology Recent  Labs  Lab 06/21/22 0830 06/22/22 0233 06/23/22 0500  WBC 10.9* 10.1 4.5  RBC 5.35 4.83 4.53  HGB 12.3* 11.2* 10.5*  HCT 41.9 38.2* 35.7*  MCV 78.3* 79.1* 78.8*  MCH 23.0* 23.2* 23.2*  MCHC 29.4* 29.3* 29.4*  RDW 19.9* 19.9* 19.6*  PLT 142* 108* 91*   Thyroid No results for input(s): "TSH", "FREET4" in the last 168 hours.  BNP Recent Labs  Lab 06/21/22 0848  BNP 465.0*    DDimer No results for input(s): "DDIMER" in the last 168 hours.   Radiology    DG Chest Port 1 View  Result Date: 06/22/2022 CLINICAL DATA:  Shortness of breath.  Admitted for pyelonephritis. EXAM: PORTABLE CHEST 1 VIEW COMPARISON:  Chest radiographs 06/21/2022 and 04/02/2022. CT 06/21/2022. FINDINGS: 1131 hours. The heart size and mediastinal contours are stable status post median sternotomy and CABG. Chronic vascular congestion without overt pulmonary edema or confluent airspace opacity. There is no pleural effusion or pneumothorax. The bones appear unchanged.  Telemetry leads overlie the chest. IMPRESSION: Stable examination. Chronic vascular congestion without overt edema or confluent airspace opacity. Electronically Signed   By: Richardean Sale M.D.   On: 06/22/2022 11:48    US Renal Transplant w/Doppler  Result Date: 06/21/2022 CLINICAL DATA:  Acute kidney failure, urinary tract infection EXAM: ULTRASOUND OF RENAL TRANSPLANT WITH RENAL DOPPLER ULTRASOUND TECHNIQUE: Ultrasound examination of the renal transplant was performed with gray-scale, color and duplex doppler evaluation. COMPARISON:  CT today FINDINGS: Transplant kidney location: Right lower quadrant Transplant Kidney: Renal measurements: 14.0 x 6.7 x 6.1 cm = volume: 220mL. Normal size and echotexture. Mild hydronephrosis in the lower pole. Color flow in the main renal artery:  Yes Color flow in the main renal vein:  Yes Duplex Doppler Evaluation: Main Renal Artery Resistive Index: 0.84 Venous waveform in main renal vein:  Present Intrarenal resistive index in upper pole:  0.79 (normal 0.6-0.8; equivocal 0.8-0.9; abnormal >= 0.9) Intrarenal resistive index in lower pole: 0.8 (normal 0.6-0.8; equivocal 0.8-0.9; abnormal >= 0.9) Bladder: Not visualized Other findings:  None. IMPRESSION: Mild fullness of the collecting system in the lower pole of the right lower quadrant renal transplant. Electronically Signed   By: Rolm Baptise M.D.   On: 06/21/2022 18:34    Cardiac Studies   TTE 1/24 1. Left ventricular ejection fraction, by estimation, is 40 to 45%. The  left ventricle has mildly decreased function. The left ventricle  demonstrates global hypokinesis. There is mild concentric left ventricular  hypertrophy. Left ventricular diastolic  function could not be evaluated.   2. Right ventricular systolic function is moderately reduced. The right  ventricular size is moderately enlarged. There is moderately elevated  pulmonary artery systolic pressure. The estimated right ventricular  systolic pressure is 123456 mmHg.   3. Left atrial size was severely dilated.   4. Right atrial size was mild to moderately dilated.   5. The mitral valve is degenerative. Mild mitral valve regurgitation. No  evidence of mitral  stenosis.   6. The aortic valve is tricuspid. Aortic valve regurgitation is not  visualized. No aortic stenosis is present.   7. The inferior vena cava is dilated in size with <50% respiratory  variability, suggesting right atrial pressure of 15 mmHg.   Patient Profile     62 y.o. male with CAD/CABG, permanent A-fib, end-stage renal disease s/p transplant, CKD 3, presenting with malaise diagnosed with pyelonephritis being seen for cardiomyopathy. EF 40 to 45%   Assessment & Plan  1.  Cardiomyopathy EF 40 to 45% -Torsemide restarted, IV Lasix given yesterday x 1 -Net -1.5 L, creatinine stable -Continue torsemide 100 mg twice tomorrow -Monitor BP -Add metolazone, if creatinine permits, PTA GDMT as BP permits -Continue to avoid nephrotoxic's.   2.  CAD/CABG -Eliquis, Crestor   3.  Permanent A-fib -Eliquis -History of bradycardia with beta-blockers.   4.  Pyelonephritis -On antibiotics as per primary team   Greater than 50% was spent in counseling and coordination of care with patient Total encounter time 50 minutes or more      Signed, Kate Sable, MD  06/23/2022, 12:42 PM

## 2022-06-23 NOTE — Progress Notes (Signed)
[facility]  ROUNDING NOTE   Subjective:   [name]. presents with shortness of breath, dyspnea on exertion, intermittent fevers and gross hematuria. Patient states he has been taking all his medications including apixaban and his immunomodulating agents: tacrolimus, mycophenolate and prednisone.    Patient found to have a urinary tract infection. He was found to have septic shock and started on aggressive IV fluids. Found to have pyelonephritis on CT.    Nephrology consulted for acute kidney injury on chronic kidney disease status post renal transplant.    Afebrile overnight.  UOP 1920mL  Taken off IVF yesterday and started on IV diuretics. Now changed to torsemide 100mg  PO bid.   Blood cultures growing serrratia.    Objective:  Vital signs in last 24 hours:  Temp:  [97.9 F (36.6 C)-98.5 F (36.9 C)] 97.9 F (36.6 C) (03/31 0200) Pulse Rate:  [58-112] 70 (03/31 0900) Resp:  [12-25] 21 (03/31 0900) BP: (98-138)/(49-81) 134/63 (03/31 0900) SpO2:  [90 %-100 %] 96 % (03/31 0900)  Weight change:  Filed Weights   06/21/22 0840  Weight: 124.3 kg    Intake/Output: I/O last 3 completed shifts: In: 1052.7 [I.V.:560.2; IV Piggyback:492.5] Out: [id number] [Urine:3225]   Intake/Output this shift:  Total I/O In: 120 [P.O.:120] Out: 300 [Urine:300]  Physical Exam: General: Ill appearing  Head: Normocephalic, atraumatic. Moist oral mucosal membranes  Eyes: Anicteric, PERRL  Neck: Supple, trachea midline  Lungs:  Clear to auscultation, 2L [place]  Heart: Irregular  Abdomen:  Obese, LLQ allograft kidney - no tenderness  Extremities:  no peripheral edema.  Neurologic: Nonfocal, moving all four extremities  Skin: No lesions       Basic Metabolic Panel: Recent Labs  Lab 06/21/22 0830 06/22/22 0230 06/22/22 0233 06/23/22 0500  NA 140  --  139 139  K 4.0  --  4.0 3.5  CL 98  --  99 101  CO2 32  --  28 29  GLUCOSE 93  --  96 125*  BUN 63*  --   60* 63*  CREATININE 2.18*  --  2.20* 2.36*  CALCIUM 9.1  --  8.8* 9.0  MG  --  2.0  --  2.3  PHOS  --  2.8  --  4.4     Liver Function Tests: Recent Labs  Lab 06/21/22 0830 06/22/22 0233  AST 25 27  ALT 14 14  ALKPHOS 114 91  BILITOT 1.3* 1.6*  PROT 7.1 6.5  ALBUMIN 3.4* 2.9*    No results for input(s): "LIPASE", "AMYLASE" in the last 168 hours. Recent Labs  Lab 06/21/22 1119  AMMONIA 19     CBC: Recent Labs  Lab 06/21/22 0830 06/22/22 0233 06/23/22 0500  WBC 10.9* 10.1 4.5  NEUTROABS 11.1*  --   --   HGB 12.3* 11.2* 10.5*  HCT 41.9 38.2* 35.7*  MCV 78.3* 79.1* 78.8*  PLT 142* 108* 91*     Cardiac Enzymes: Recent Labs  Lab 06/21/22 0830  CKTOTAL 38*     BNP: Invalid input(s): "POCBNP"  CBG: Recent Labs  Lab 06/22/22 1928 06/22/22 2331 06/23/22 0317 06/23/22 0426 06/23/22 0811  GLUCAP 173* 135* 112* 118* 115*     Microbiology: Results for orders placed or performed during the hospital encounter of 06/21/22  Blood culture (routine x 2)     Status: Abnormal (Preliminary result)   Collection Time: 06/21/22  8:38 AM   Specimen: BLOOD  Result Value Ref Range Status  Specimen Description   Final    BLOOD RIGHT ARM Performed at [facility], [address], [place], [place] [address]    Special Requests   Final    NONE Performed at [facility], [address] [place], [place] [address]    Culture  Setup Time   Final    Organism ID to follow [place] BOTTLE ONLY CRITICAL RESULT CALLED TO, READ BACK BY AND VERIFIED WITH: RAQUEL RODRIGUEZ GUZMAN AT [id number] 06/22/22.PMF  REVIEWED BY A. LAFRANCE    Culture (A)  Final    SERRATIA MARCESCENS SUSCEPTIBILITIES TO FOLLOW Performed at [facility], [place] [address] [place], [place] [address]    Report Status PENDING  Incomplete  Blood Culture ID Panel (Reflexed)     Status: Abnormal   Collection Time: 06/21/22  8:38 AM  Result Value Ref Range Status    Enterococcus faecalis NOT DETECTED NOT DETECTED Final   Enterococcus Faecium NOT DETECTED NOT DETECTED Final   Listeria monocytogenes NOT DETECTED NOT DETECTED Final   Staphylococcus species NOT DETECTED NOT DETECTED Final   Staphylococcus aureus (BCID) NOT DETECTED NOT DETECTED Final   Staphylococcus epidermidis NOT DETECTED NOT DETECTED Final   Staphylococcus lugdunensis NOT DETECTED NOT DETECTED Final   Streptococcus species NOT DETECTED NOT DETECTED Final   Streptococcus agalactiae NOT DETECTED NOT DETECTED Final   Streptococcus pneumoniae NOT DETECTED NOT DETECTED Final   Streptococcus pyogenes NOT DETECTED NOT DETECTED Final   A.calcoaceticus-baumannii NOT DETECTED NOT DETECTED Final   Bacteroides fragilis NOT DETECTED NOT DETECTED Final   Enterobacterales DETECTED (A) NOT DETECTED Final    Comment: Enterobacterales represent a large order of gram negative bacteria, not a single organism. CRITICAL RESULT CALLED TO, READ BACK BY AND VERIFIED WITH: RAQUEL RODRIGUEZ GUZMAN AT [id number] 06/22/22.PMF    Enterobacter cloacae complex NOT DETECTED NOT DETECTED Final   Escherichia coli NOT DETECTED NOT DETECTED Final   Klebsiella aerogenes NOT DETECTED NOT DETECTED Final   Klebsiella oxytoca NOT DETECTED NOT DETECTED Final   Klebsiella pneumoniae NOT DETECTED NOT DETECTED Final   Proteus species NOT DETECTED NOT DETECTED Final   Salmonella species NOT DETECTED NOT DETECTED Final   Serratia marcescens DETECTED (A) NOT DETECTED Final    Comment: CRITICAL RESULT CALLED TO, READ BACK BY AND VERIFIED WITH: RAQUEL RODRIGUEZ GUZMAN AT [id number] 06/22/22.PMF    Haemophilus influenzae NOT DETECTED NOT DETECTED Final   Neisseria meningitidis NOT DETECTED NOT DETECTED Final   Pseudomonas aeruginosa NOT DETECTED NOT DETECTED Final   Stenotrophomonas maltophilia NOT DETECTED NOT DETECTED Final   Candida albicans NOT DETECTED NOT DETECTED Final   Candida auris NOT DETECTED NOT DETECTED Final   Candida  glabrata NOT DETECTED NOT DETECTED Final   Candida krusei NOT DETECTED NOT DETECTED Final   Candida parapsilosis NOT DETECTED NOT DETECTED Final   Candida tropicalis NOT DETECTED NOT DETECTED Final   Cryptococcus neoformans/gattii NOT DETECTED NOT DETECTED Final   CTX-M ESBL NOT DETECTED NOT DETECTED Final   Carbapenem resistance IMP NOT DETECTED NOT DETECTED Final   Carbapenem resistance KPC NOT DETECTED NOT DETECTED Final   Carbapenem resistance NDM NOT DETECTED NOT DETECTED Final   Carbapenem resist OXA 48 LIKE NOT DETECTED NOT DETECTED Final   Carbapenem resistance VIM NOT DETECTED NOT DETECTED Final    Comment: Performed at [facility], [address], [place], [place] [address]  Urine Culture     Status: Abnormal   Collection Time: 06/21/22  8:48  AM   Specimen: Urine, Clean Catch  Result Value Ref Range Status   Specimen Description   Final    URINE, CLEAN CATCH Performed at [facility], [address] [place], [place] [address]    Special Requests   Final    NONE Performed at [facility], [place], [place] [address]    Culture >=100,000 COLONIES/mL SERRATIA MARCESCENS (A)  Final   Report Status 06/23/2022 FINAL  Final   Organism ID, Bacteria SERRATIA MARCESCENS (A)  Final      Susceptibility   Serratia marcescens - MIC*    CEFEPIME <=0.12 SENSITIVE Sensitive     CEFTRIAXONE <=0.25 SENSITIVE Sensitive     CIPROFLOXACIN 0.5 INTERMEDIATE Intermediate     GENTAMICIN <=1 SENSITIVE Sensitive     NITROFURANTOIN 256 RESISTANT Resistant     TRIMETH/SULFA >=320 RESISTANT Resistant     * >=100,000 COLONIES/mL SERRATIA MARCESCENS  Resp panel by RT-PCR (RSV, Flu A&B, Covid) Anterior Nasal Swab     Status: None   Collection Time: 06/21/22  8:48 AM   Specimen: Anterior Nasal Swab  Result Value Ref Range Status   SARS Coronavirus 2 by RT PCR NEGATIVE NEGATIVE Final    Comment: (NOTE) SARS-CoV-2 target nucleic acids are NOT  DETECTED.  The SARS-CoV-2 RNA is generally detectable in upper respiratory specimens during the acute phase of infection. The lowest concentration of SARS-CoV-2 viral copies this assay can detect is 138 copies/mL. A negative result does not preclude SARS-Cov-2 infection and should not be used as the sole basis for treatment or other patient management decisions. A negative result may occur with  improper specimen collection/handling, submission of specimen other than nasopharyngeal swab, presence of viral mutation(s) within the areas targeted by this assay, and inadequate number of viral copies(<138 copies/mL). A negative result must be combined with clinical observations, patient history, and epidemiological information. The expected result is Negative.  Fact Sheet for Patients:  EntrepreneurPulse.com.au  Fact Sheet for Healthcare Providers:  IncredibleEmployment.be  This test is no t yet approved or cleared by the [place] FDA and  has been authorized for detection and/or diagnosis of SARS-CoV-2 by FDA under an Emergency Use Authorization (EUA). This EUA will remain  in effect (meaning this test can be used) for the duration of the COVID-19 declaration under Section 564(b)(1) of the Act, 21 U.S.C.section 360bbb-3(b)(1), unless the authorization is terminated  or revoked sooner.       Influenza A by PCR NEGATIVE NEGATIVE Final   Influenza B by PCR NEGATIVE NEGATIVE Final    Comment: (NOTE) The Xpert Xpress SARS-CoV-2/FLU/RSV plus assay is intended as an aid in the diagnosis of influenza from Nasopharyngeal swab specimens and should not be used as a sole basis for treatment. Nasal washings and aspirates are unacceptable for Xpert Xpress SARS-CoV-2/FLU/RSV testing.  Fact Sheet for Patients: EntrepreneurPulse.com.au  Fact Sheet for Healthcare Providers: IncredibleEmployment.be  This test is not yet  approved or cleared by the [place] FDA and has been authorized for detection and/or diagnosis of SARS-CoV-2 by FDA under an Emergency Use Authorization (EUA). This EUA will remain in effect (meaning this test can be used) for the duration of the COVID-19 declaration under Section 564(b)(1) of the Act, 21 U.S.C. section 360bbb-3(b)(1), unless the authorization is terminated or revoked.     Resp Syncytial Virus by PCR NEGATIVE NEGATIVE Final    Comment: (NOTE) Fact Sheet for Patients: EntrepreneurPulse.com.au  Fact Sheet for Healthcare Providers: IncredibleEmployment.be  This test  is not yet approved or cleared by the [place] and has been authorized for detection and/or diagnosis of SARS-CoV-2 by FDA under an Emergency Use Authorization (EUA). This EUA will remain in effect (meaning this test can be used) for the duration of the COVID-19 declaration under Section 564(b)(1) of the Act, 21 U.S.C. section 360bbb-3(b)(1), unless the authorization is terminated or revoked.  Performed at [facility], [address] [place], [place] [address]   Culture, blood (x 2)     Status: None (Preliminary result)   Collection Time: 06/21/22  1:20 PM   Specimen: BLOOD  Result Value Ref Range Status   Specimen Description BLOOD BLOOD RIGHT ARM  Final   Special Requests   Final    BOTTLES DRAWN AEROBIC AND ANAEROBIC Blood Culture adequate volume   Culture   Final    NO GROWTH 2 DAYS Performed at [facility], [address], [place], [place] [address]    Report Status PENDING  Incomplete  Culture, blood (x 2)     Status: None (Preliminary result)   Collection Time: 06/21/22  1:20 PM   Specimen: BLOOD  Result Value Ref Range Status   Specimen Description BLOOD BLOOD RIGHT HAND  Final   Special Requests   Final    BOTTLES DRAWN AEROBIC AND ANAEROBIC Blood Culture results may not be optimal due to an excessive volume of  blood received in culture bottles   Culture   Final    NO GROWTH 2 DAYS Performed at [facility], [address] [place], [place] [address]    Report Status PENDING  Incomplete  MRSA Next Gen by PCR, Nasal     Status: None   Collection Time: 06/21/22  1:57 PM   Specimen: Nasal Mucosa; Nasal Swab  Result Value Ref Range Status   MRSA by PCR Next Gen NOT DETECTED NOT DETECTED Final    Comment: (NOTE) The GeneXpert MRSA Assay (FDA approved for NASAL specimens only), is one component of a comprehensive MRSA colonization surveillance program. It is not intended to diagnose MRSA infection nor to guide or monitor treatment for MRSA infections. Test performance is not FDA approved in patients less than 28 years old. Performed at [facility], [address] [place], [place] [address]    *Note: Due to a large number of results and/or encounters for the requested time period, some results have not been displayed. A complete set of results can be found in Results Review.    Coagulation Studies: Recent Labs    06/21/22 0848  LABPROT 19.8*  INR 1.7*     Urinalysis: Recent Labs    06/21/22 0848  COLORURINE RED*  LABSPEC 1.014  PHURINE TEST NOT REPORTED DUE TO COLOR INTERFERENCE OF URINE PIGMENT  GLUCOSEU TEST NOT REPORTED DUE TO COLOR INTERFERENCE OF URINE PIGMENT*  HGBUR TEST NOT REPORTED DUE TO COLOR INTERFERENCE OF URINE PIGMENT*  BILIRUBINUR TEST NOT REPORTED DUE TO COLOR INTERFERENCE OF URINE PIGMENT*  KETONESUR TEST NOT REPORTED DUE TO COLOR INTERFERENCE OF URINE PIGMENT*  PROTEINUR TEST NOT REPORTED DUE TO COLOR INTERFERENCE OF URINE PIGMENT*  NITRITE TEST NOT REPORTED DUE TO COLOR INTERFERENCE OF URINE PIGMENT*  LEUKOCYTESUR TEST NOT REPORTED DUE TO COLOR INTERFERENCE OF URINE PIGMENT*       Imaging: DG Chest Port 1 View  Result Date: 06/22/2022 CLINICAL DATA:  Shortness of breath.  Admitted for pyelonephritis. EXAM: PORTABLE CHEST 1 VIEW  COMPARISON:  Chest radiographs 06/21/2022 and 04/02/2022. CT  06/21/2022. FINDINGS: 1131 hours. The heart size and mediastinal contours are stable status post median sternotomy and CABG. Chronic vascular congestion without overt pulmonary edema or confluent airspace opacity. There is no pleural effusion or pneumothorax. The bones appear unchanged.  Telemetry leads overlie the chest. IMPRESSION: Stable examination. Chronic vascular congestion without overt edema or confluent airspace opacity. Electronically Signed   By: Richardean Sale M.D.   On: 06/22/2022 11:48   US Renal Transplant w/Doppler  Result Date: 06/21/2022 CLINICAL DATA:  Acute kidney failure, urinary tract infection EXAM: ULTRASOUND OF RENAL TRANSPLANT WITH RENAL DOPPLER ULTRASOUND TECHNIQUE: Ultrasound examination of the renal transplant was performed with gray-scale, color and duplex doppler evaluation. COMPARISON:  CT today FINDINGS: Transplant kidney location: Right lower quadrant Transplant Kidney: Renal measurements: 14.0 x 6.7 x 6.1 cm = volume: 272mL. Normal size and echotexture. Mild hydronephrosis in the lower pole. Color flow in the main renal artery:  Yes Color flow in the main renal vein:  Yes Duplex Doppler Evaluation: Main Renal Artery Resistive Index: 0.84 Venous waveform in main renal vein:  Present Intrarenal resistive index in upper pole:  0.79 (normal 0.6-0.8; equivocal 0.8-0.9; abnormal >= 0.9) Intrarenal resistive index in lower pole: 0.8 (normal 0.6-0.8; equivocal 0.8-0.9; abnormal >= 0.9) Bladder: Not visualized Other findings:  None. IMPRESSION: Mild fullness of the collecting system in the lower pole of the right lower quadrant renal transplant. Electronically Signed   By: Rolm Baptise M.D.   On: 06/21/2022 18:34     Medications:    azithromycin 500 mg (06/23/22 1108)   ceFEPime (MAXIPIME) IV 2 g (06/23/22 1028)   potassium chloride      apixaban  5 mg Oral BID   buPROPion  150 mg Oral BID   Chlorhexidine  Gluconate Cloth  6 each Topical Daily   insulin aspart  0-15 Units Subcutaneous Q4H   mycophenolate  360 mg Oral BID   predniSONE  5 mg Oral Daily   rosuvastatin  10 mg Oral QHS   sulfamethoxazole-trimethoprim  1 tablet Oral Q M,W,F   tacrolimus  2 mg Oral BID   torsemide  100 mg Oral BID   acetaminophen, budesonide (PULMICORT) nebulizer solution, ipratropium-albuterol, ondansetron **OR** ondansetron (ZOFRAN) IV  Assessment/ Plan:  [name]. is a 62 y.o. Hispanic male renal transplant in 2012, coronary artery disease status post CABG, atrial fibrillation, congestive heart failure, gout, GERD, hyperlipidemia, pulmonary hypertension, status post gastric bypass, diabetes mellitus type II, who was admitted to [facility] on 06/21/2022 for Pyelonephritis [N12] Acute respiratory failure with hypoxia [J96.01] Sepsis [A41.9] Sepsis, due to unspecified organism, unspecified whether acute organ dysfunction present [A41.9]    Acute kidney injury on chronic kidney disease stage IIIA-T: baseline creatinine of 1.63, GFR of 47 on 05/30/22. Followed by [facility]phrology. Transplant doppler reviewed with patient.  - Pending tacrolimus trough level. Continue tacrolimus 2g PO bid.  - continue mycophenolate and prednisone - Continue PJP prophylaxis with Bactrim   Pyelonephritis/urinary tract infection/sepsis: cultures growing serratia marcences in urine and blood from 3/29. Pending sensitivities.  - empiric antibiotics: cefepime   Hypotension: with septic shock. Not requiring vasopressors. Holding home blood pressure regimen: hydralazine, isosorbide mononitrate, metolazone, tamsulosin and torsemide. - holding all agents except torsemide.    Diabetes mellitus type II with chronic kidney disease: insulin dependent. Hemoglobin A1c of 7.7% on 05/15/22. Holding dapagliflozin.    LOS: 2 [name] 3/31/202411:29 AM

## 2022-06-24 DIAGNOSIS — A498 Other bacterial infections of unspecified site: Secondary | ICD-10-CM | POA: Diagnosis not present

## 2022-06-24 DIAGNOSIS — R7881 Bacteremia: Secondary | ICD-10-CM | POA: Diagnosis not present

## 2022-06-24 DIAGNOSIS — I5023 Acute on chronic systolic (congestive) heart failure: Secondary | ICD-10-CM

## 2022-06-24 DIAGNOSIS — N390 Urinary tract infection, site not specified: Secondary | ICD-10-CM | POA: Diagnosis not present

## 2022-06-24 DIAGNOSIS — Z940 Kidney transplant status: Secondary | ICD-10-CM | POA: Diagnosis not present

## 2022-06-24 DIAGNOSIS — N12 Tubulo-interstitial nephritis, not specified as acute or chronic: Secondary | ICD-10-CM | POA: Diagnosis not present

## 2022-06-24 HISTORY — DX: Urinary tract infection, site not specified: N39.0

## 2022-06-24 LAB — PHOSPHORUS: Phosphorus: 3.1 mg/dL (ref 2.5–4.6)

## 2022-06-24 LAB — GLUCOSE, CAPILLARY
Glucose-Capillary: 119 mg/dL — ABNORMAL HIGH (ref 70–99)
Glucose-Capillary: 137 mg/dL — ABNORMAL HIGH (ref 70–99)
Glucose-Capillary: 155 mg/dL — ABNORMAL HIGH (ref 70–99)
Glucose-Capillary: 185 mg/dL — ABNORMAL HIGH (ref 70–99)
Glucose-Capillary: 210 mg/dL — ABNORMAL HIGH (ref 70–99)
Glucose-Capillary: 227 mg/dL — ABNORMAL HIGH (ref 70–99)
Glucose-Capillary: 272 mg/dL — ABNORMAL HIGH (ref 70–99)

## 2022-06-24 LAB — BASIC METABOLIC PANEL
Anion gap: 9 (ref 5–15)
Anion gap: 10 (ref 5–15)
BUN: 73 mg/dL — ABNORMAL HIGH (ref 8–23)
BUN: 85 mg/dL — ABNORMAL HIGH (ref 8–23)
CO2: 30 mmol/L (ref 22–32)
CO2: 26 mmol/L (ref 22–32)
Calcium: 9 mg/dL (ref 8.9–10.3)
Calcium: 8.9 mg/dL (ref 8.9–10.3)
Chloride: 100 mmol/L (ref 98–111)
Chloride: 98 mmol/L (ref 98–111)
Creatinine, Ser: 2.61 mg/dL — ABNORMAL HIGH (ref 0.61–1.24)
Creatinine, Ser: 3.07 mg/dL — ABNORMAL HIGH (ref 0.61–1.24)
GFR, Estimated: 27 mL/min — ABNORMAL LOW (ref >60)
GFR, Estimated: 22 mL/min — ABNORMAL LOW (ref >60)
Glucose, Bld: 156 mg/dL — ABNORMAL HIGH (ref 70–99)
Glucose, Bld: 288 mg/dL — ABNORMAL HIGH (ref 70–99)
Potassium: 3.5 mmol/L (ref 3.5–5.1)
Potassium: 3.5 mmol/L (ref 3.5–5.1)
Sodium: 139 mmol/L (ref 135–145)
Sodium: 134 mmol/L — ABNORMAL LOW (ref 135–145)

## 2022-06-24 LAB — CBC
HCT: 35.3 % — ABNORMAL LOW (ref 39.0–52.0)
Hemoglobin: 10.3 g/dL — ABNORMAL LOW (ref 13.0–17.0)
MCH: 23 pg — ABNORMAL LOW (ref 26.0–34.0)
MCHC: 29.2 g/dL — ABNORMAL LOW (ref 30.0–36.0)
MCV: 78.8 fL — ABNORMAL LOW (ref 80.0–100.0)
Platelets: 95 10*3/uL — ABNORMAL LOW (ref 150–400)
RBC: 4.48 MIL/uL (ref 4.22–5.81)
RDW: 19.4 % — ABNORMAL HIGH (ref 11.5–15.5)
WBC: 5 10*3/uL (ref 4.0–10.5)
nRBC: 0 % (ref 0.0–0.2)

## 2022-06-24 LAB — MAGNESIUM: Magnesium: 2.2 mg/dL (ref 1.7–2.4)

## 2022-06-24 MED ORDER — PANTOPRAZOLE SODIUM 40 MG PO TBEC
40.0000 mg | DELAYED_RELEASE_TABLET | Freq: Every day | ORAL | Status: DC
  Administered 2022-06-24 – 2022-06-27 (×4): 40 mg via ORAL
  Filled 2022-06-24 (×4): qty 1

## 2022-06-24 MED ORDER — HYDRALAZINE HCL 50 MG PO TABS
50.0000 mg | ORAL_TABLET | Freq: Four times a day (QID) | ORAL | Status: DC | PRN
  Administered 2022-06-27 (×2): 50 mg via ORAL
  Filled 2022-06-24 (×2): qty 1

## 2022-06-24 MED ORDER — METOLAZONE 5 MG PO TABS
5.0000 mg | ORAL_TABLET | Freq: Once | ORAL | Status: AC
  Administered 2022-06-24: 5 mg via ORAL
  Filled 2022-06-24: qty 1

## 2022-06-24 MED ORDER — HYDROXYZINE HCL 25 MG PO TABS
25.0000 mg | ORAL_TABLET | Freq: Three times a day (TID) | ORAL | Status: DC | PRN
  Administered 2022-06-24 – 2022-06-27 (×3): 25 mg via ORAL
  Filled 2022-06-24 (×4): qty 1

## 2022-06-24 MED ORDER — FUROSEMIDE 10 MG/ML IJ SOLN
80.0000 mg | Freq: Once | INTRAMUSCULAR | Status: AC
  Administered 2022-06-24: 80 mg via INTRAVENOUS
  Filled 2022-06-24: qty 8

## 2022-06-24 MED ORDER — MONTELUKAST SODIUM 10 MG PO TABS
10.0000 mg | ORAL_TABLET | Freq: Every day | ORAL | Status: DC
  Administered 2022-06-24 – 2022-06-27 (×4): 10 mg via ORAL
  Filled 2022-06-24 (×4): qty 1

## 2022-06-24 MED ORDER — ALUM & MAG HYDROXIDE-SIMETH 200-200-20 MG/5ML PO SUSP
30.0000 mL | ORAL | Status: DC | PRN
  Administered 2022-06-24: 30 mL via ORAL
  Filled 2022-06-24: qty 30

## 2022-06-24 MED ORDER — TORSEMIDE 20 MG PO TABS
100.0000 mg | ORAL_TABLET | Freq: Every day | ORAL | Status: DC
  Administered 2022-06-25: 100 mg via ORAL
  Filled 2022-06-24: qty 5

## 2022-06-24 MED ORDER — TAMSULOSIN HCL 0.4 MG PO CAPS
0.4000 mg | ORAL_CAPSULE | Freq: Every day | ORAL | Status: DC
  Administered 2022-06-25 – 2022-06-28 (×4): 0.4 mg via ORAL
  Filled 2022-06-24 (×4): qty 1

## 2022-06-24 MED ORDER — AZITHROMYCIN 250 MG PO TABS
500.0000 mg | ORAL_TABLET | Freq: Every day | ORAL | Status: DC
  Administered 2022-06-24: 500 mg via ORAL
  Filled 2022-06-24: qty 2

## 2022-06-24 MED ORDER — POLYSACCHARIDE IRON COMPLEX 150 MG PO CAPS
150.0000 mg | ORAL_CAPSULE | Freq: Every day | ORAL | Status: DC
  Administered 2022-06-25 – 2022-06-28 (×4): 150 mg via ORAL
  Filled 2022-06-24 (×4): qty 1

## 2022-06-24 MED ORDER — HYDRALAZINE HCL 20 MG/ML IJ SOLN
10.0000 mg | Freq: Four times a day (QID) | INTRAMUSCULAR | Status: DC | PRN
  Administered 2022-06-27: 10 mg via INTRAVENOUS
  Filled 2022-06-24: qty 1

## 2022-06-24 NOTE — TOC Initial Note (Signed)
Transition of Care (TOC) - Initial/Assessment Note    Patient Details  Name: [name]. MRN: [id number] Date of Birth: 02-Jun-1960  Transition of Care [facility]) CM/SW Contact:    Beverly Sessions, RN Phone Number: 06/24/2022, 9:48 AM  Clinical Narrative:                  Admitted for: pyelonephritis   Admitted from: Home with wife PCP: [place] Current home health/prior home health/DME: CPAP, scale   Transition of Care (TOC) Screening Note   Patient Details  Name: [name]. Date of Birth: 1961/02/21   Transition of Care [facility]) CM/SW Contact:    Beverly Sessions, RN Phone Number: 06/24/2022, 9:50 AM    Transition of Care Department [facility]) has reviewed patient and no TOC needs have been identified at this time. We will continue to monitor patient advancement through interdisciplinary progression rounds. If new patient transition needs arise, please place a TOC consult.          Patient Goals and CMS Choice            Expected Discharge Plan and Services                                              Prior Living Arrangements/Services                       Activities of Daily Living      Permission Sought/Granted                  Emotional Assessment              Admission diagnosis:  Pyelonephritis [N12] Acute respiratory failure with hypoxia [J96.01] Sepsis [A41.9] Sepsis, due to unspecified organism, unspecified whether acute organ dysfunction present [A41.9] Patient Active Problem List   Diagnosis Date Noted   Ischemic cardiomyopathy 06/22/2022   Permanent atrial fibrillation 06/22/2022   Pyelonephritis 06/21/2022   Sepsis 06/21/2022   Acute respiratory failure with hypoxia 06/21/2022   Anogenital warts in male 05/01/2022   Acute on chronic systolic heart failure, NYHA class 3 04/01/2022   Internal hemorrhoids 01/21/2022   Type 2 MI (myocardial infarction) 11/02/2021   Depression 11/02/2021    Pancreatitis 11/02/2021   Chronic atrial fibrillation    PAH (pulmonary artery hypertension)    Type II diabetes mellitus with renal manifestations    Obesity with body mass index of 30.0-39.9    Normocytic anemia    Preventative health care 10/30/2021   Mood disorder 10/30/2021   Morbid obesity 10/30/2021   Chronic kidney disease, stage 3b 08/15/2021   GERD (gastroesophageal reflux disease) 04/26/2021   Attention or concentration deficit 04/26/2021   Erectile dysfunction due to arterial insufficiency 10/20/2019   Pulmonary hypertension due to left heart disease 01/20/2019   Superficial thrombophlebitis 12/23/2018   Chronic combined systolic and diastolic CHF (congestive heart failure) 11/19/2016   Chronic venous insufficiency 06/05/2016   Proteinuria 06/23/2015   BPH with obstruction/lower urinary tract symptoms 09/25/2014   Hypogonadism in male 09/25/2014   Immunosuppressed status 09/09/2014   Type 2 diabetes mellitus with hyperglycemia, with long-term current use of insulin 09/09/2014   Paroxysmal atrial fibrillation 07/18/2014   Benign neoplasm of colon 04/22/2013   Diverticulosis of colon 04/22/2013   History of renal transplant 03/01/2013   Asthma, persistent controlled  02/25/2013   Obstructive sleep apnea 09/29/2012   Nocturnal hypoxemia 09/24/2012   Gout 09/13/2012   Obesity hypoventilation syndrome 03/31/2012   Essential hypertension 12/17/2010   Hyperlipidemia    CAD (coronary artery disease) 2005   PCP:  [name], MD Pharmacy:   [facility] [place] [address] Phone: [phone] Fax: [phone]  [name] [address] [place] [address] Phone: [phone] Fax: [phone]  [id number] - [place], [place] [address] [place] [address] Phone: [phone] Fax: [phone]     Social Determinants of Health  (SDOH) Social History: Junior: No Food Insecurity (04/01/2022)  Housing: Low Risk  (04/01/2022)  Transportation Needs: No Transportation Needs (04/01/2022)  Utilities: Not At Risk (04/01/2022)  Depression (PHQ2-9): Low Risk  (10/30/2021)  Tobacco Use: Medium Risk (06/21/2022)   SDOH Interventions:     Readmission Risk Interventions    06/24/2022    9:39 AM 11/04/2021   11:44 AM 05/15/2020    2:49 PM  Readmission Risk Prevention Plan  Transportation Screening Complete Complete Complete  PCP or Specialist Appt within 3-5 Days   Complete  HRI or [place]   Complete  Social Work Consult for [place] Planning/Counseling Complete  Complete  Palliative Care Screening Not Applicable  Not Applicable  Medication Review Press photographer) Complete Complete Complete  PCP or Specialist appointment within 3-5 days of discharge  Complete   HRI or [place]  Complete   SW Recovery Care/Counseling Consult  Complete   [place]  Not Applicable

## 2022-06-24 NOTE — Consult Note (Addendum)
NAME: [name].  DOB: May 20, 1960  MRN: [id number]  Date/Time: 06/24/2022 12:07 PM  REQUESTING PROVIDER: Dr. Dwyane Dee Subjective:  REASON FOR CONSULT: Bacteremia ? [name]. is a 62 y.o. with a history of renal transplant, on tacrolimus, gastric sleeve surgery, cad S/p CABG, gout, diabetes mellitus, hypertension,HFpEF, pulmonary hypertension, BPH with urolift 12/25/21 on 3/8 fulguration of extensive anal condylomata presents with fever, shortness of breath and blood in the urine. Has had some trouble passing urine for a couple of days Patient has a history of deceased donor renal transplant 6/12 and is on tacrolimus, steroids and followed at [facility] in the ED  06/21/22 08:36  BP 143/84 !  Temp 101.5 F (38.6 C) !  Pulse Rate 120 !  Resp 24 !  SpO2 86 % (L)    Latest Reference Range & Units 06/21/22 08:30  WBC 4.0 - 10.5 K/uL 10.9 (H)  Hemoglobin 13.0 - 17.0 g/dL 12.3 (L)  HCT 39.0 - 52.0 % 41.9  Platelets 150 - 400 K/uL 142 (L) [1]  Creatinine 0.61 - 1.24 mg/dL 2.18 (H)   CXR cardiomegaly CT chest showed stable nodule Rt LL present since 2021 thought to be benign CT abd/chest showed rt transplant kidney to have some fullness lower pole Blood culture and uc sent and patient was started on broad spectrum antibiotic  As blood culture is positive for serratia I am seeing the patient UC is also positive for the same organism Pt had urolift on 12/25/21 and 6 implants were placed He had some symptoms of LUTS following that and a urine culture on 12/28/21 grew 6K of pseudomonas putida and 300 col of serratia both sensitive to bactrim and he was treated with that for 1 weeks He is being followed by urology as OP Past Medical History:  Diagnosis Date   Anal condyloma    Anemia    Aortic atherosclerosis ([place])    Aortic stenosis    a.) TTE 10/2021: Mod AS. AVA 1.06cm^2 (VTI). Mean grad 44mmHg; b.) TTE 04/02/2022: no AV stenosis   Asthma, persistent  controlled 02/25/2013   Attention or concentration deficit 04/26/2021   BPH with obstruction/lower urinary tract symptoms 09/25/2014   CAD (coronary artery disease) 2005   a.) LHC 1996 -> HG stenosis mLAD -> PTCA/PCI with stent x 1 (unk type) -> complicated by CFA pseudoanurysm (required surg);  b.) LHC 08/10/2002  -> 95% LAD, 70% ISR LAD, 80% pOM, 70% mRCA --> CVTS consult; c.) 4v CABG 08/13/2002; c.) 03/2018 MV: Small, fixed inferoapical and apical sep defect. No isc. EF 47% (60-65% by 05/2018 TTE); d.) 02/2021 MV: small Apical inf fixed defect. No isc -> low risk.   Cardiomegaly    Cellulitis and abscess of left leg 07/22/2020   Chronic atrial fibrillation    a.) CHA2DS2VASc = 4 (CHF, HTN, vascular disease history, T2DM);  b.) rate/rhythm maintained without pharmacological intervention; chronically anticoagulated with apixaban   Chronic combined systolic and diastolic CHF (congestive heart failure)    a.) TTE 7/18 : EF 45-50%; b.) TTE 05/2018 : EF 60-65%, RVSP 74.8; c.) TTE 12/2018: EF 50-55%. Sev dil LA; d.) TTE 04/2019: EF 45-50%; e.) TTE 07/2021: EF 40-45%, G3DD; f.) TTE 10/2021: EF 40-45%, mild conc LVH, septal-lat dyssynchrony (LBBB), mildly red RVSF, mild-mod dil LA, mildly dil RA, mild-mod MS, mod AS; g.) TTE 04/02/2022: EF 40-45%, glob HK, LVH, red RVSF, RVE, sev LAE, mild-mod RAE, mild MR   Chronic venous stasis dermatitis of both  lower extremities    Cytomegaloviral disease 2017   Diverticulosis of colon 04/22/2013   Elevated RIGHT hemidiaphragm    Erectile dysfunction    a.) on topical TRT + Trimix (alprostadil/papaverine/phentolamine) injections   ESRD (end stage renal disease) ([place])    a.) dialysis dependent 2004-2012; b.) s/p cadaveric RIGHT renal transplant 09/13/2010   Essential hypertension 12/17/2010   GERD (gastroesophageal reflux disease)    Gout    History of 2019 novel coronavirus disease (COVID-19) 06/30/2019   History of bilateral cataract extraction 10/2017   History of  renal transplant 09/13/2010   a.) s/p cadaveric donor transplant 09/13/2010   Hx of bilateral cataract extraction 10/2017   Hyperlipidemia    Hypogonadism in male 09/25/2014   Ischemic cardiomyopathy    Left cephalic vein thrombosis [id number]   Long term current use of anticoagulant    a.) apixaban   Long term current use of immunosuppressive drug    a.) mycophenolate + prednisone + tacrolimus   Major depressive disorder 10/04/2013   Mitral stenosis 11/07/2021   a.) TTE 11/07/2021: severe MAC, mild-mod MS (MPG 6 mmHg); b.) TTE 04/02/2022: no MV stenosis   Murmur    Obesity hypoventilation syndrome 03/31/2012   OSA treated with BiPAP 09/29/2012   PAH (pulmonary artery hypertension)    a. 04/2019 RHC: RA 19, RV 80/20, PA 78/31 (51), PCWP 25, CO/CI 7.69/3.26. PVR 3.25 --> Sev PAH, likely primarily PV HTN; b.) RHC 04/04/2022: mRA 13, mPA 40, mPCWP 20, PA sat 59, AO sat 92, CO 7.63, CI 3.26, PVR 2.6 --> mod portal venous HTN   Pancreatitis    S/P CABG x 4 08/13/2002   a.) LIMA-LAD, LRA-OM, SVG-D1, SVG-dRCA   S/P gastric bypass    Synovitis of finger 06/11/2019   Trigger finger, unspecified little finger 12/23/2018   Type 2 diabetes mellitus with hyperglycemia, with long-term current use of insulin 09/09/2014   a.) has FreeStyle Libre CGM   Type 2 MI (myocardial infarction) ([place])    Ulcer 05/2016   Left shin   Wears glasses     Past Surgical History:  Procedure Laterality Date   CARDIAC CATHETERIZATION N/A 08/10/2002   CATARACT EXTRACTION W/PHACO Right 10/29/2017   Procedure: CATARACT EXTRACTION PHACO AND INTRAOCULAR LENS PLACEMENT ([place])  RIGHT DIABETIC;  Surgeon: [name], MD;  Location: [facility];  Service: Ophthalmology;  Laterality: Right   CATARACT EXTRACTION W/PHACO Left 11/18/2017   Procedure: CATARACT EXTRACTION PHACO AND INTRAOCULAR LENS PLACEMENT ([place]) LEFT IVA/TOPICAL;  Surgeon: [name], MD;  Location: [facility];  Service:  Ophthalmology;  Laterality: Left   COLONOSCOPY WITH PROPOFOL N/A 04/22/2013   Procedure: COLONOSCOPY WITH PROPOFOL;  Surgeon: [name], MD;  [facility];  Service: Endoscopy;  Laterality: N/A;   CORONARY ANGIOPLASTY WITH STENT PLACEMENT N/A 1996   CORONARY ARTERY BYPASS GRAFT N/A 08/13/2002   Procedure: 4v CORONARY ARTERY BYPASS GRAFTING; Location: [name]; Surgeon: [name], MD   CYSTOSCOPY WITH INSERTION OF UROLIFT N/A 12/25/2021   Procedure: CYSTOSCOPY WITH INSERTION OF UROLIFT;  Surgeon: [name], MD;  Location: [facility];  Service: Urology;  Laterality: N/A;   DG ANGIO AV SHUNT*L*     right and left upper arms   FASCIOTOMY  03/03/2012   Procedure: FASCIOTOMY;  Surgeon: [name], MD;  Location: [facility];  Service: Orthopedics;  Laterality: Right;  FASCIOTOMY RIGHT SMALL FINGER   FASCIOTOMY Left 08/17/2013   Procedure: FASCIOTOMY LEFT RING;  Surgeon: Dillard Essex  Fredna Dow, MD;  Location: [facility];  Service: Orthopedics;  Laterality: Left;   INCISION AND DRAINAGE ABSCESS Left 10/15/2015   Procedure: INCISION AND DRAINAGE ABSCESS;  Surgeon: [name], MD;  Location: [facility];  Service: General;  Laterality: Left;   KIDNEY TRANSPLANT  09/13/2010   cadaver--at [facility] HEART CATH N/A 11/15/2016   Procedure: RIGHT HEART CATH;  Surgeon: [name], MD;  Location: [facility];  Service: Cardiovascular;  Laterality: N/A;   RIGHT HEART CATH N/A 05/03/2019   Procedure: RIGHT HEART CATH;  Surgeon: [name], MD;  Location: [facility];  Service: Cardiovascular;  Laterality: N/A;   RIGHT HEART CATH N/A 04/04/2022   Procedure: RIGHT HEART CATH;  Surgeon: [name], MD;  Location: [facility];  Service: Cardiovascular;  Laterality: N/A;   ROUX-EN-Y GASTRIC BYPASS N/A 2015   TYMPANIC MEMBRANE REPAIR Left 03/2010   VASECTOMY     WART FULGURATION N/A 05/31/2022   Procedure: FULGURATION  ANAL WART;  Surgeon: Benjamine Sprague, DO;  Location: [facility];  Service: General;  Laterality: N/A;    Social History   Socioeconomic History   Marital status: Married    Spouse name: Not on file   Number of children: 2   Years of education: 14   Highest education level: Associate degree: academic program  Occupational History   Occupation: Warden/ranger: unemployed    Comment: disabled due to kidney failure  Tobacco Use   Smoking status: Former    Types: Cigars    Quit date: 09/02/1994    Years since quitting: 27.8    Passive exposure: Never   Smokeless tobacco: Never   Tobacco comments:    Occasional, rare cigars  Vaping Use   Vaping Use: Never used  Substance and Sexual Activity   Alcohol use: Not Currently    Comment: occ   Drug use: No   Sexual activity: Not on file  Other Topics Concern   Not on file  Social History Narrative   Has living will   Wife is health care POA   Would accept resuscitation attempts   No tube feedings if cognitively unaware   Social Determinants of Health   Financial Resource Strain: Not on file  Food Insecurity: No Food Insecurity (04/01/2022)   Hunger Vital Sign    Worried About Running Out of Food in the Last Year: Never true    Ran Out of Food in the Last Year: Never true  Transportation Needs: No Transportation Needs (04/01/2022)   PRAPARE - Hydrologist (Medical): No    Lack of Transportation (Non-Medical): No  Physical Activity: Not on file  Stress: Not on file  Social Connections: Not on file  Intimate Partner Violence: Not At Risk (04/01/2022)   Humiliation, Afraid, Rape, and Kick questionnaire    Fear of Current or Ex-Partner: No    Emotionally Abused: No    Physically Abused: No    Sexually Abused: No    Family History  Problem Relation Age of Onset   Heart disease Father    Kidney failure Father    Kidney disease Father    Diabetes Maternal Grandmother    Breast  cancer Maternal Grandmother    Valvular heart disease Mother    Liver cancer Paternal Uncle    Liver cancer Paternal Grandmother    Prostate cancer Neg Hx    Allergies  Allergen Reactions   Contrast Media [Iodinated Contrast Media] Other (See Comments)    Kidney transplant   Iodine Other (See Comments)    Other reaction(s): Other (See Comments) Kidney transplant   Ibuprofen Other (See Comments)    Due to kidney transplant Due to kidney transplant Due to kidney transplant   I? Current Facility-Administered Medications  Medication Dose Route Frequency Provider Last Rate Last Admin   acetaminophen (TYLENOL) tablet 1,000 mg  1,000 mg Oral Q6H PRN Sharion Settler, NP   1,000 mg at 06/23/22 1804   alum & mag hydroxide-simeth (MAALOX/MYLANTA) 200-200-20 MG/5ML suspension 30 mL  30 mL Oral Q4H PRN Val Riles, MD   30 mL at 06/24/22 [id number]   apixaban (ELIQUIS) tablet 5 mg  5 mg Oral BID Deneise Lever, MD   5 mg at 06/24/22 0909   azithromycin (ZITHROMAX) tablet 500 mg  500 mg Oral Daily [name], RPH   500 mg at 06/24/22 1203   budesonide (PULMICORT) nebulizer solution 0.25 mg  0.25 mg Nebulization Daily PRN Dorothe Pea, RPH       buPROPion [facility] SR) 12 hr tablet 150 mg  150 mg Oral BID Deneise Lever, MD   150 mg at 06/24/22 0909   ceFEPIme (MAXIPIME) 2 g in sodium chloride 0.9 % 100 mL IVPB  2 g Intravenous Q12H [name], RPH 200 mL/hr at 06/24/22 [id number] 2 g at 06/24/22 [id number]   hydrOXYzine (ATARAX) tablet 25 mg  25 mg Oral TID PRN Val Riles, MD       insulin aspart (novoLOG) injection 0-15 Units  0-15 Units Subcutaneous Q4H Deneise Lever, MD   5 Units at 06/24/22 1203   ipratropium-albuterol (DUONEB) 0.5-2.5 (3) MG/3ML nebulizer solution 3 mL  3 mL Nebulization Q6H PRN Val Riles, MD       Derrill Memo ON 06/25/2022] iron polysaccharides (NIFEREX) capsule 150 mg  150 mg Oral Daily Val Riles, MD       montelukast (SINGULAIR) tablet 10 mg  10 mg Oral QHS  Val Riles, MD       mycophenolate (MYFORTIC) EC tablet 360 mg  360 mg Oral BID Deneise Lever, MD   360 mg at 06/24/22 0910   ondansetron (ZOFRAN) tablet 4 mg  4 mg Oral Q6H PRN Deneise Lever, MD       Or   ondansetron [facility]) injection 4 mg  4 mg Intravenous Q6H PRN Deneise Lever, MD       pantoprazole (PROTONIX) EC tablet 40 mg  40 mg Oral QHS Val Riles, MD       predniSONE (DELTASONE) tablet 5 mg  5 mg Oral Daily Deneise Lever, MD   5 mg at 06/24/22 [id number]   rosuvastatin (CRESTOR) tablet 10 mg  10 mg Oral QHS Kate Sable, MD   10 mg at 06/23/22 2043   saccharomyces boulardii (FLORASTOR) capsule 250 mg  250 mg Oral BID Val Riles, MD   250 mg at 06/24/22 [id number]   sulfamethoxazole-trimethoprim (BACTRIM DS) 800-160 MG per tablet 1 tablet  1 tablet Oral Q M,W,F Kolluru, Sarath, MD   1 tablet at 06/24/22 0909   tacrolimus (PROGRAF) capsule 2 mg  2 mg Oral BID Kolluru, Sarath, MD   2 mg at 06/24/22 0909   [START ON 06/25/2022] tamsulosin (FLOMAX) capsule 0.4 mg  0.4 mg Oral QPC breakfast Val Riles, MD       [START ON 06/25/2022] torsemide (DEMADEX) tablet 100 mg  100  mg Oral Daily Colon Flattery, NP         Abtx:  Anti-infectives (From admission, onward)    Start     Dose/Rate Route Frequency Ordered Stop   06/24/22 1145  azithromycin (ZITHROMAX) tablet 500 mg        500 mg Oral Daily 06/24/22 1054 06/27/22 0959   06/22/22 1300  azithromycin (ZITHROMAX) 500 mg in sodium chloride 0.9 % 250 mL IVPB  Status:  Discontinued        500 mg 250 mL/hr over 60 Minutes Intravenous Every 24 hours 06/22/22 1123 06/24/22 1054   06/21/22 2200  ceFEPIme (MAXIPIME) 2 g in sodium chloride 0.9 % 100 mL IVPB        2 g 200 mL/hr over 30 Minutes Intravenous Every 12 hours 06/21/22 1144     06/21/22 1800  sulfamethoxazole-trimethoprim (BACTRIM DS) 800-160 MG per tablet 1 tablet        1 tablet Oral Every M-W-F 06/21/22 1709     06/21/22 0845  ceFEPIme (MAXIPIME) 2 g in sodium  chloride 0.9 % 100 mL IVPB        2 g 200 mL/hr over 30 Minutes Intravenous  Once 06/21/22 0838 06/21/22 1017   06/21/22 0845  metroNIDAZOLE (FLAGYL) IVPB 500 mg  Status:  Discontinued        500 mg 100 mL/hr over 60 Minutes Intravenous  Once 06/21/22 0838 06/21/22 1227   06/21/22 0845  vancomycin (VANCOCIN) IVPB 1000 mg/200 mL premix        1,000 mg 200 mL/hr over 60 Minutes Intravenous  Once 06/21/22 [id number] 06/21/22 1130       REVIEW OF SYSTEMS:  Const: fever,  chills, negative weight loss Eyes: negative diplopia or visual changes, negative eye pain ENT: negative coryza, negative sore throat Resp: negative cough, hemoptysis, has dyspnea Cards: negative for chest pain, palpitations, lower extremity edema GU: + frequency, dysuria and hematuria GI: +abdominal pain, no diarrhea, bleeding, constipation Skin: negative for rash and pruritus Heme: negative for easy bruising and gum/nose bleeding MS: general weakness Neurolo:some confusion on presentation which has cleared Psych: negative for feelings of anxiety, depression  Endocrine:  diabetes Allergy/Immunology- as above ? Objective:  VITALS:  BP 134/83 (BP Location: Right Arm)   Pulse 87   Temp 98.2 F (36.8 C) (Oral)   Resp 18   Ht 5\' 10"  (1.778 m)   Wt 124.3 kg   SpO2 96%   BMI 39.31 kg/m   PHYSICAL EXAM:  General: Alert, cooperative, no distress, appears stated age.  Head: Normocephalic, without obvious abnormality, atraumatic. Eyes: Conjunctivae clear, anicteric sclerae. Pupils are equal ENT Nares normal. No drainage or sinus tenderness. Lips, mucosa, and tongue normal. No Thrush Neck: Supple, symmetrical, no adenopathy, thyroid: non tender no carotid bruit and no JVD. Back: No CVA tenderness. Lungs: b/l air entry Heart: irregular -well controlled Abdomen: Soft, non-tender,not distended. Surgical scar Bowel sounds normal. No masses Extremities: venous stasis with pigmentation b/l Left UE AVG Rt UE AVG Skin: No  rashes or lesions. Or bruising Lymph: Cervical, supraclavicular normal. Neurologic: Grossly non-focal Pertinent Labs Lab Results CBC    Component Value Date/Time   WBC 5.0 06/24/2022 0326   RBC 4.48 06/24/2022 0326   HGB 10.3 (L) 06/24/2022 0326   HGB 13.0 06/10/2019 1019   HCT 35.3 (L) 06/24/2022 0326   HCT 41.6 06/10/2019 1019   PLT 95 (L) 06/24/2022 0326   PLT 154 06/10/2019 1019   MCV 78.8 (L) 06/24/2022 0326  MCV 78 (L) 06/10/2019 1019   MCV 83 06/16/2014 0911   MCH 23.0 (L) 06/24/2022 0326   MCHC 29.2 (L) 06/24/2022 0326   RDW 19.4 (H) 06/24/2022 0326   RDW 21.8 (H) 06/10/2019 1019   RDW 14.9 (H) 06/16/2014 0911   LYMPHSABS 0.8 06/21/2022 0830   LYMPHSABS 1.4 06/10/2019 1019   LYMPHSABS 0.5 (L) 06/16/2014 0911   MONOABS 0.9 06/21/2022 0830   MONOABS 0.5 06/16/2014 0911   EOSABS 0.0 06/21/2022 0830   EOSABS 0.0 06/10/2019 1019   EOSABS 0.1 06/16/2014 0911   BASOSABS 0.1 06/21/2022 0830   BASOSABS 0.0 06/10/2019 1019   BASOSABS 0.1 06/16/2014 0911       Latest Ref Rng & Units 06/24/2022    3:26 AM 06/23/2022    5:00 AM 06/22/2022    2:33 AM  CMP  Glucose 70 - 99 mg/dL 156  125  96   BUN 8 - 23 mg/dL 73  63  60   Creatinine 0.61 - 1.24 mg/dL 2.61  2.36  2.20   Sodium 135 - 145 mmol/L 139  139  139   Potassium 3.5 - 5.1 mmol/L 3.5  3.5  4.0   Chloride 98 - 111 mmol/L 100  101  99   CO2 22 - 32 mmol/L 30  29  28    Calcium 8.9 - 10.3 mg/dL 9.0  9.0  8.8   Total Protein 6.5 - 8.1 g/dL   6.5   Total Bilirubin 0.3 - 1.2 mg/dL   1.6   Alkaline Phos 38 - 126 U/L   91   AST 15 - 41 U/L   27   ALT 0 - 44 U/L   14       Microbiology: Recent Results (from the past 240 hour(s))  Blood culture (routine x 2)     Status: Abnormal   Collection Time: 06/21/22  8:38 AM   Specimen: BLOOD  Result Value Ref Range Status   Specimen Description   Final    BLOOD RIGHT ARM Performed at [facility], [name] [place], [place] [address]    Special Requests    Final    NONE Performed at [facility], [address] [place],  [address]    Culture  Setup Time   Final    Organism ID to follow [place] CRITICAL RESULT CALLED TO, READ BACK BY AND VERIFIED WITH: RAQUEL RODRIGUEZ GUZMAN AT [id number] 06/22/22.PMF  REVIEWED BY A. LAFRANCE Performed at [facility], [place] [address], [place], [place] [address]    Culture SERRATIA MARCESCENS (A)  Final   Report Status 06/24/2022 FINAL  Final   Organism ID, Bacteria SERRATIA MARCESCENS  Final      Susceptibility   Serratia marcescens - MIC*    CEFEPIME <=0.12 SENSITIVE Sensitive     CEFTAZIDIME <=1 SENSITIVE Sensitive     CEFTRIAXONE <=0.25 SENSITIVE Sensitive     CIPROFLOXACIN 0.5 INTERMEDIATE Intermediate     GENTAMICIN <=1 SENSITIVE Sensitive     TRIMETH/SULFA >=320 RESISTANT Resistant     * SERRATIA MARCESCENS  Blood Culture ID Panel (Reflexed)     Status: Abnormal   Collection Time: 06/21/22  8:38 AM  Result Value Ref Range Status   Enterococcus faecalis NOT DETECTED NOT DETECTED Final   Enterococcus Faecium NOT DETECTED NOT DETECTED Final   Listeria monocytogenes NOT DETECTED NOT DETECTED Final   Staphylococcus species NOT DETECTED NOT DETECTED Final   Staphylococcus  aureus (BCID) NOT DETECTED NOT DETECTED Final   Staphylococcus epidermidis NOT DETECTED NOT DETECTED Final   Staphylococcus lugdunensis NOT DETECTED NOT DETECTED Final   Streptococcus species NOT DETECTED NOT DETECTED Final   Streptococcus agalactiae NOT DETECTED NOT DETECTED Final   Streptococcus pneumoniae NOT DETECTED NOT DETECTED Final   Streptococcus pyogenes NOT DETECTED NOT DETECTED Final   A.calcoaceticus-baumannii NOT DETECTED NOT DETECTED Final   Bacteroides fragilis NOT DETECTED NOT DETECTED Final   Enterobacterales DETECTED (A) NOT DETECTED Final    Comment: Enterobacterales represent a large order of gram negative bacteria, not a single organism. CRITICAL RESULT  CALLED TO, READ BACK BY AND VERIFIED WITH: RAQUEL RODRIGUEZ GUZMAN AT [id number] 06/22/22.PMF    Enterobacter cloacae complex NOT DETECTED NOT DETECTED Final   Escherichia coli NOT DETECTED NOT DETECTED Final   Klebsiella aerogenes NOT DETECTED NOT DETECTED Final   Klebsiella oxytoca NOT DETECTED NOT DETECTED Final   Klebsiella pneumoniae NOT DETECTED NOT DETECTED Final   Proteus species NOT DETECTED NOT DETECTED Final   Salmonella species NOT DETECTED NOT DETECTED Final   Serratia marcescens DETECTED (A) NOT DETECTED Final    Comment: CRITICAL RESULT CALLED TO, READ BACK BY AND VERIFIED WITH: RAQUEL RODRIGUEZ GUZMAN AT [id number] 06/22/22.PMF    Haemophilus influenzae NOT DETECTED NOT DETECTED Final   Neisseria meningitidis NOT DETECTED NOT DETECTED Final   Pseudomonas aeruginosa NOT DETECTED NOT DETECTED Final   Stenotrophomonas maltophilia NOT DETECTED NOT DETECTED Final   Candida albicans NOT DETECTED NOT DETECTED Final   Candida auris NOT DETECTED NOT DETECTED Final   Candida glabrata NOT DETECTED NOT DETECTED Final   Candida krusei NOT DETECTED NOT DETECTED Final   Candida parapsilosis NOT DETECTED NOT DETECTED Final   Candida tropicalis NOT DETECTED NOT DETECTED Final   Cryptococcus neoformans/gattii NOT DETECTED NOT DETECTED Final   CTX-M ESBL NOT DETECTED NOT DETECTED Final   Carbapenem resistance IMP NOT DETECTED NOT DETECTED Final   Carbapenem resistance KPC NOT DETECTED NOT DETECTED Final   Carbapenem resistance NDM NOT DETECTED NOT DETECTED Final   Carbapenem resist OXA 48 LIKE NOT DETECTED NOT DETECTED Final   Carbapenem resistance VIM NOT DETECTED NOT DETECTED Final    Comment: Performed at [facility], [address] [place], [place] [address]  Urine Culture     Status: Abnormal   Collection Time: 06/21/22  8:48 AM   Specimen: Urine, Clean Catch  Result Value Ref Range Status   Specimen Description   Final    URINE, CLEAN CATCH Performed at [facility],  [address] [place], [place] [address]    Special Requests   Final    NONE Performed at [facility], [place], [place] [address]    Culture >=100,000 COLONIES/mL SERRATIA MARCESCENS (A)  Final   Report Status 06/23/2022 FINAL  Final   Organism ID, Bacteria SERRATIA MARCESCENS (A)  Final      Susceptibility   Serratia marcescens - MIC*    CEFEPIME <=0.12 SENSITIVE Sensitive     CEFTRIAXONE <=0.25 SENSITIVE Sensitive     CIPROFLOXACIN 0.5 INTERMEDIATE Intermediate     GENTAMICIN <=1 SENSITIVE Sensitive     NITROFURANTOIN 256 RESISTANT Resistant     TRIMETH/SULFA >=320 RESISTANT Resistant     * >=100,000 COLONIES/mL SERRATIA MARCESCENS  Resp panel by RT-PCR (RSV, Flu A&B, Covid) Anterior Nasal Swab     Status: None   Collection Time: 06/21/22  8:48 AM   Specimen: Anterior Nasal Swab  Result Value Ref Range Status   SARS Coronavirus 2 by RT PCR NEGATIVE NEGATIVE Final    Comment: (NOTE) SARS-CoV-2 target nucleic acids are NOT DETECTED.  The SARS-CoV-2 RNA is generally detectable in upper respiratory specimens during the acute phase of infection. The lowest concentration of SARS-CoV-2 viral copies this assay can detect is 138 copies/mL. A negative result does not preclude SARS-Cov-2 infection and should not be used as the sole basis for treatment or other patient management decisions. A negative result may occur with  improper specimen collection/handling, submission of specimen other than nasopharyngeal swab, presence of viral mutation(s) within the areas targeted by this assay, and inadequate number of viral copies(<138 copies/mL). A negative result must be combined with clinical observations, patient history, and epidemiological information. The expected result is Negative.  Fact Sheet for Patients:  EntrepreneurPulse.com.au  Fact Sheet for Healthcare Providers:  IncredibleEmployment.be  This test is no t yet  approved or cleared by the [place] FDA and  has been authorized for detection and/or diagnosis of SARS-CoV-2 by FDA under an Emergency Use Authorization (EUA). This EUA will remain  in effect (meaning this test can be used) for the duration of the COVID-19 declaration under Section 564(b)(1) of the Act, 21 U.S.C.section 360bbb-3(b)(1), unless the authorization is terminated  or revoked sooner.       Influenza A by PCR NEGATIVE NEGATIVE Final   Influenza B by PCR NEGATIVE NEGATIVE Final    Comment: (NOTE) The Xpert Xpress SARS-CoV-2/FLU/RSV plus assay is intended as an aid in the diagnosis of influenza from Nasopharyngeal swab specimens and should not be used as a sole basis for treatment. Nasal washings and aspirates are unacceptable for Xpert Xpress SARS-CoV-2/FLU/RSV testing.  Fact Sheet for Patients: EntrepreneurPulse.com.au  Fact Sheet for Healthcare Providers: IncredibleEmployment.be  This test is not yet approved or cleared by the [place] FDA and has been authorized for detection and/or diagnosis of SARS-CoV-2 by FDA under an Emergency Use Authorization (EUA). This EUA will remain in effect (meaning this test can be used) for the duration of the COVID-19 declaration under Section 564(b)(1) of the Act, 21 U.S.C. section 360bbb-3(b)(1), unless the authorization is terminated or revoked.     Resp Syncytial Virus by PCR NEGATIVE NEGATIVE Final    Comment: (NOTE) Fact Sheet for Patients: EntrepreneurPulse.com.au  Fact Sheet for Healthcare Providers: IncredibleEmployment.be  This test is not yet approved or cleared by the [place] FDA and has been authorized for detection and/or diagnosis of SARS-CoV-2 by FDA under an Emergency Use Authorization (EUA). This EUA will remain in effect (meaning this test can be used) for the duration of the COVID-19 declaration under Section 564(b)(1) of  the Act, 21 U.S.C. section 360bbb-3(b)(1), unless the authorization is terminated or revoked.  Performed at [facility], [address] [place], [place] [address]   Culture, blood (x 2)     Status: None (Preliminary result)   Collection Time: 06/21/22  1:20 PM   Specimen: BLOOD  Result Value Ref Range Status   Specimen Description BLOOD BLOOD RIGHT ARM  Final   Special Requests   Final    BOTTLES DRAWN AEROBIC AND ANAEROBIC Blood Culture adequate volume   Culture   Final    NO GROWTH 3 DAYS Performed at [facility], [address], [place], [place] [address]    Report Status PENDING  Incomplete  Culture, blood (x 2)     Status: None (Preliminary result)   Collection Time: 06/21/22  1:20  PM   Specimen: BLOOD  Result Value Ref Range Status   Specimen Description BLOOD BLOOD RIGHT HAND  Final   Special Requests   Final    BOTTLES DRAWN AEROBIC AND ANAEROBIC Blood Culture results may not be optimal due to an excessive volume of blood received in culture bottles   Culture   Final    NO GROWTH 3 DAYS Performed at [facility], [address] [place], [place] [address]    Report Status PENDING  Incomplete  MRSA Next Gen by PCR, Nasal     Status: None   Collection Time: 06/21/22  1:57 PM   Specimen: Nasal Mucosa; Nasal Swab  Result Value Ref Range Status   MRSA by PCR Next Gen NOT DETECTED NOT DETECTED Final    Comment: (NOTE) The GeneXpert MRSA Assay (FDA approved for NASAL specimens only), is one component of a comprehensive MRSA colonization surveillance program. It is not intended to diagnose MRSA infection nor to guide or monitor treatment for MRSA infections. Test performance is not FDA approved in patients less than 77 years old. Performed at [facility], [address] [place], [place] [address]   Culture, blood (Routine X 2) w Reflex to ID Panel     Status: None (Preliminary result)   Collection Time: 06/23/22  5:00 AM    Specimen: BLOOD  Result Value Ref Range Status   Specimen Description BLOOD BLOOD RIGHT ARM  Final   Special Requests   Final    BOTTLES DRAWN AEROBIC AND ANAEROBIC Blood Culture results may not be optimal due to an excessive volume of blood received in culture bottles   Culture   Final    NO GROWTH 1 DAY Performed at [facility], [address] [place], [place] [address]    Report Status PENDING  Incomplete  Culture, blood (Routine X 2) w Reflex to ID Panel     Status: None (Preliminary result)   Collection Time: 06/23/22  5:14 AM   Specimen: BLOOD  Result Value Ref Range Status   Specimen Description BLOOD BLOOD RIGHT HAND  Final   Special Requests   Final    BOTTLES DRAWN AEROBIC AND ANAEROBIC Blood Culture results may not be optimal due to an excessive volume of blood received in culture bottles   Culture   Final    NO GROWTH 1 DAY Performed at [facility], [address], [place], [place] [address]    Report Status PENDING  Incomplete    IMAGING RESULTS:  I have personally reviewed the films ?transpalnt kidney   Impression/Recommendation ?serratia bacteremia Serratia UTI Complicated Uti with transplant kidney lower pole fullness pyelonephritis Also BPH with urolift with 6 implants in the prostate Will check post void bladder scan to look for residue Will need IV cefepime ( may be ceftriaxone) for 2-4  weeks Will discuss with urology regarding risk for prostate implants to get infected  ESRD- DDRT in 2012 on tacrolimus/mycophenolate  Anemia  Afib  CHF  Pulmonary HTN  Gout  DM management as per primary team  HTN  CAD s/p CABG H/o gastric sleeve surgery ? ? ___________________________________________________ Discussed with patient, requesting provider

## 2022-06-24 NOTE — Consult Note (Signed)
Advanced Heart Failure Team Consult Note   Primary Physician: [name], MD PCP-Cardiologist:  [name], MD HF MD: Aundra Dubin  Reason for Consultation: ADHF  HPI:    [name]. is seen today for evaluation of HF at the request of Dr. Garen Lah.   Mr. Sencere Vanname is a 62 y.o. male with a hx of CAD status post CABG in 2005, permanent A-fib on apixaban, ESRD status post renal transplant in 2012 with subsequent CKD stage IIIb, HFmrEF, pulmonary hypertension, OSA on BIPAP, HTN, HLD, and obesity s/p gastric bypass.   Abbreviated cardiac history: CABG x 4 in [place] [facility] 1/20 EF 47% small fixed apical septal defect, no ischemia. Low risk study.  Cardiolite 12/22: no change  Echo (3/20): EF 60-65%, PASP 75 mmHg, mildly dilated RV with mildly decreased systolic function.  Echo (1/24): Echo  EF 40-45%, RV moderately reduced and RVSP 49 mmHg, mild MR, no AS, dilated IVC.  - RHC (1/24): RA 13, PA 62/24 (40), PCWP 20, CO/CI (Fick) 7.63/3.26, PVR 2.6 WU, PAPi 2.9     Admitted Januar 1/24 for acute on chronic diastolic and systolic CHF. Moderately elevated right heart pressures on echo, confirmed on right heart catheterization. Had diuresis with Lasix, metolazone   Admitted June 21, 2022 for malaise, fever chills, body aches, diagnosed with pyelonephritis/serratia sepsis  in the setting of renal transplant, temperature 101.5. Ucx/Bcx + serratia. CT with mild R hydronephrosis. + early cirrhosis  Hypotensive pressures 88/58 improved with IV fluids, several liters given -> developed pulmonary edema/respiratory distress -> treated with IV lasix  Troponin 97, WBC 10.9 creatinine 2.18 lactic acid 2.1 Viral screen negative COVID, flu, RSV  Now back on po torsemide.Received 100mg  this am with poor urine output. Urine very dark. Feels bloated. SOB. No CP.  Scr stab 2.3-2.7  Review of Systems: [y] = yes, [ ]  = no   General: Weight gain [ y]; Weight loss [ ] ;  Anorexia [ ] ; Fatigue [ ] ; Fever Blue.Reese ]; Chills [ y]; Weakness [ y]  Cardiac: Chest pain/pressure [ ] ; Resting SOB Blue.Reese ]; Exertional SOB [ y]; Orthopnea [ ] ; Pedal Edema [ y]; Palpitations [ ] ; Syncope [ ] ; Presyncope [ ] ; Paroxysmal nocturnal dyspnea[ ]   Pulmonary: Cough [ ] ; Wheezing[ ] ; Hemoptysis[ ] ; Sputum [ ] ; Snoring [ ]   GI: Vomiting[ ] ; Dysphagia[ ] ; Melena[ ] ; Hematochezia [ ] ; Heartburn[ ] ; Abdominal pain Blue.Reese ]; Constipation [ ] ; Diarrhea [ ] ; BRBPR [ ]   GU: Hematuria[ ] ; Dysuria [ ] ; Nocturia[ ]   Vascular: Pain in legs with walking [ ] ; Pain in feet with lying flat [ ] ; Non-healing sores [ ] ; Stroke [ ] ; TIA [ ] ; Slurred speech [ ] ;  Neuro: Headaches[ ] ; Vertigo[ ] ; Seizures[ ] ; Paresthesias[ ] ;Blurred vision [ ] ; Diplopia [ ] ; Vision changes [ ]   Ortho/Skin: Arthritis [ y]; Joint pain Blue.Reese ]; Muscle pain [ ] ; Joint swelling [ ] ; Back Pain [ ] ; Rash [ ]   Psych: Depression[y ]; Anxiety[y ]  Heme: Bleeding problems [ ] ; Clotting disorders [ ] ; Anemia [ ]   Endocrine: Diabetes Blue.Reese ]; Thyroid dysfunction[ ]   Home Medications Prior to Admission medications   Medication Sig Start Date End Date Taking? Authorizing Provider  acetaminophen (TYLENOL) 325 MG tablet Take 2 tablets (650 mg total) by mouth every 8 (eight) hours as needed for mild pain. 05/31/22 06/30/22 Yes Sakai, Isami, DO  albuterol (PROVENTIL) (2.5 MG/3ML) 0.083% nebulizer solution Take 3 mLs (2.5 mg  total) by nebulization every 6 (six) hours as needed for wheezing or shortness of breath. 01/02/21  Yes Michela Pitcher, NP  allopurinol (ZYLOPRIM) 100 MG tablet Take 100 mg by mouth every morning.   Yes [provider]  apixaban (ELIQUIS) 5 MG TABS tablet TAKE 1 TABLET BY MOUTH TWICE DAILY Patient taking differently: Take 5 mg by mouth 2 (two) times daily. 03/15/22 03/15/23 Yes [name], MD  beclomethasone (QVAR) 80 MCG/ACT inhaler Inhale 2 puffs into the lungs daily as needed (for shortness of breath).   Yes [provider]  buPROPion (WELLBUTRIN SR) 150 MG 12 hr tablet Take 1 tablet (150 mg total) by mouth 2 (two) times daily. 06/17/22 06/17/23 Yes [name], MD  colchicine 0.6 MG tablet Take 1 tablet (0.6 mg total) by mouth every other day 05/31/22  Yes Sakai, Isami, DO  cyanocobalamin 100 MCG tablet Take 100 mcg by mouth daily. 02/23/21  Yes [provider]  dapagliflozin propanediol (FARXIGA) 10 MG TABS tablet Take 1 tablet (10 mg total) by mouth daily before breakfast. 12/04/21  Yes Milford, Maricela Bo, FNP  febuxostat (ULORIC) 40 MG tablet Take 1 tablet (40 mg total) by mouth daily. 01/22/22  Yes   furosemide (LASIX) 80 MG tablet Take 80 mg by mouth daily.   Yes [provider]  gabapentin (NEURONTIN) 300 MG capsule Take 1 capsule (300 mg total) by mouth 2 (two) times daily. 12/04/21  Yes   hydrALAZINE (APRESOLINE) 25 MG tablet Take 1 tablet (25 mg total) by mouth every 8 (eight) hours. 04/06/22  Yes   insulin glargine-yfgn (SEMGLEE) 100 UNIT/ML Pen Inject 35 Units into the skin daily. Patient taking differently: Inject 35 Units into the skin at bedtime. 01/22/22  Yes   insulin lispro (HUMALOG KWIKPEN) 100 UNIT/ML KwikPen Inject 10-20 Units into the skin 3 (three) times daily. Per sliding scale 05/31/22  Yes Sakai, Isami, DO  iron polysaccharides (FERREX 150) 150 MG capsule Take 1 capsule (150 mg total) by mouth daily. 05/31/22  Yes Sakai, Isami, DO  isosorbide mononitrate (IMDUR) 30 MG 24 hr tablet Take 1 tablet (30 mg total) by mouth daily. 04/06/22  Yes Goodrich, Callie E, PA-C  lidocaine (LIDODERM) 5 % Place 1 patch onto the skin daily.PLACE 1 PATCH ONTO THE SKIN FOR 12 (TWELVE) HOURS. REMOVE & DISCARD PATCH WITHIN 12 HOURS OR AS DIRECTED BY MD 04/29/22 04/29/23 Yes [name], MD  lidocaine (XYLOCAINE) 5 % ointment Apply 1 Application topically 3 (three) times daily as needed. Apply pea size amount to area as needed for pain. 05/31/22  Yes Sakai, Isami, DO  losartan (COZAAR) 50 MG  tablet Take 50 mg by mouth daily. 03/01/22  Yes [provider]  metolazone (ZAROXOLYN) 2.5 MG tablet Take 1 tablet by mouth 3 times weekly. 05/30/22  Yes [name], MD  montelukast (SINGULAIR) 10 MG tablet TAKE 1 TABLET BY MOUTH AT BEDTIME. Patient taking differently: Take 10 mg by mouth at bedtime. 12/04/21 12/04/22 Yes Martyn Ehrich, NP  mycophenolate (MYFORTIC) 180 MG EC tablet Take 2 tablets (360 mg total) by mouth 2 (two) times daily. 04/04/22  Yes   omeprazole (PRILOSEC) 20 MG capsule TAKE 1 CAPSULE BY MOUTH DAILY. Patient taking differently: Take 20 mg by mouth at bedtime. 01/07/22 01/07/23 Yes [name], MD  potassium chloride (KLOR-CON) 10 MEQ tablet Take 6 tablets (60 mEq total) by mouth 2 (two) times daily. 04/17/22 01/12/23 Yes Milford, Maricela Bo, FNP  predniSONE (  DELTASONE) 5 MG tablet Take 1 tablet (5 mg total) by mouth daily. 03/01/22  Yes   rosuvastatin (CRESTOR) 10 MG tablet Take 1 tablet (10 mg total) by mouth daily. Patient taking differently: Take 10 mg by mouth at bedtime. 07/25/21  Yes [name], MD  tacrolimus (PROGRAF) 1 MG capsule Take 2 capsules (2 mg total) by mouth 2 times daily. 03/01/22  Yes   tamsulosin (FLOMAX) 0.4 MG CAPS capsule TAKE 1 CAPSULE BY MOUTH DAILY. Patient taking differently: Take 0.4 mg by mouth daily after breakfast. 05/28/21  Yes   Testosterone 10 MG/ACT (2%) GEL Apply 1 Pump topically daily to inner thighs - 2 pumps total. 05/30/22  Yes McGowan, Larene Beach A, PA-C  torsemide (DEMADEX) 20 MG tablet Take 5 tablets (100 mg total) by mouth 2 (two) times daily. 05/30/22  Yes [name], MD  zinc sulfate 220 (50 Zn) MG capsule Take 220 mg by mouth daily.   Yes [provider]  albuterol (VENTOLIN HFA) 108 (90 Base) MCG/ACT inhaler Inhale 2 puffs into the lungs every 6 (six) hours as needed for wheezing or shortness of breath. 05/30/22   Deneise Lever, MD  AMBULATORY NON FORMULARY MEDICATION Trimix  (30/1/10)-(Pap/Phent/PGE)  Test Dose  39ml vial   Qty #3 [place] [phone] Fax [phone] 04/30/22   Zara Council A, PA-C  cholecalciferol (VITAMIN D3) 25 MCG (1000 UNIT) tablet Take 1,000 Units by mouth at bedtime. Patient not taking: Reported on 06/21/2022    [provider]  colchicine 0.6 MG tablet Take 1 tablet (0.6 mg total) by mouth every other day. 06/17/22     Continuous Blood Gluc Sensor (FREESTYLE LIBRE 3 SENSOR) MISC Use one every 2 weeks. 06/13/22     fluticasone (FLONASE) 50 MCG/ACT nasal spray PLACE 2 SPRAYS INTO BOTH NOSTRILS DAILY. 08/03/21 08/03/22  [name] I, MD  hydrocortisone 2.5 % cream Apply topically 3 (three) times daily as needed. 01/21/22   [name], MD  Insulin Pen Needle (UNIFINE PENTIPS) 31G X 5 MM MISC Use 4 times daily as directed 05/28/21     Multiple Vitamin (MULTIVITAMIN WITH MINERALS) TABS tablet Take 1 tablet by mouth daily. Patient not taking: Reported on 06/21/2022    [provider]  sulfamethoxazole-trimethoprim (BACTRIM) 400-80 MG tablet TAKE 1 TABLET BY MOUTH EVERY MONDAY, [place], FRIDAY. 09/21/21       Past Medical History: Past Medical History:  Diagnosis Date   Anal condyloma    Anemia    Aortic atherosclerosis ([place])    Aortic stenosis    a.) TTE 10/2021: Mod AS. AVA 1.06cm^2 (VTI). Mean grad 52mmHg; b.) TTE 04/02/2022: no AV stenosis   Asthma, persistent controlled 02/25/2013   Attention or concentration deficit 04/26/2021   BPH with obstruction/lower urinary tract symptoms 09/25/2014   CAD (coronary artery disease) 2005   a.) LHC 1996 -> HG stenosis mLAD -> PTCA/PCI with stent x 1 (unk type) -> complicated by CFA pseudoanurysm (required surg);  b.) LHC 08/10/2002  -> 95% LAD, 70% ISR LAD, 80% pOM, 70% mRCA --> CVTS consult; c.) 4v CABG 08/13/2002; c.) 03/2018 MV: Small, fixed inferoapical and apical sep defect. No isc. EF 47% (60-65% by 05/2018 TTE); d.) 02/2021 MV: small Apical  inf fixed defect. No isc -> low risk.   Cardiomegaly    Cellulitis and abscess of left leg 07/22/2020   Chronic atrial fibrillation    a.) CHA2DS2VASc = 4 (CHF, HTN, vascular disease history, T2DM);  b.) rate/rhythm maintained without pharmacological intervention; chronically anticoagulated with apixaban   Chronic combined systolic and diastolic CHF (congestive heart failure)    a.) TTE 7/18 : EF 45-50%; b.) TTE 05/2018 : EF 60-65%, RVSP 74.8; c.) TTE 12/2018: EF 50-55%. Sev dil LA; d.) TTE 04/2019: EF 45-50%; e.) TTE 07/2021: EF 40-45%, G3DD; f.) TTE 10/2021: EF 40-45%, mild conc LVH, septal-lat dyssynchrony (LBBB), mildly red RVSF, mild-mod dil LA, mildly dil RA, mild-mod MS, mod AS; g.) TTE 04/02/2022: EF 40-45%, glob HK, LVH, red RVSF, RVE, sev LAE, mild-mod RAE, mild MR   Chronic venous stasis dermatitis of both lower extremities    Cytomegaloviral disease 2017   Diverticulosis of colon 04/22/2013   Elevated RIGHT hemidiaphragm    Erectile dysfunction    a.) on topical TRT + Trimix (alprostadil/papaverine/phentolamine) injections   ESRD (end stage renal disease) ([place])    a.) dialysis dependent 2004-2012; b.) s/p cadaveric RIGHT renal transplant 09/13/2010   Essential hypertension 12/17/2010   GERD (gastroesophageal reflux disease)    Gout    History of 2019 novel coronavirus disease (COVID-19) 06/30/2019   History of bilateral cataract extraction 10/2017   History of renal transplant 09/13/2010   a.) s/p cadaveric donor transplant 09/13/2010   Hx of bilateral cataract extraction 10/2017   Hyperlipidemia    Hypogonadism in male 09/25/2014   Ischemic cardiomyopathy    Left cephalic vein thrombosis [id number]   Long term current use of anticoagulant    a.) apixaban   Long term current use of immunosuppressive drug    a.) mycophenolate + prednisone + tacrolimus   Major depressive disorder 10/04/2013   Mitral stenosis 11/07/2021   a.) TTE 11/07/2021: severe MAC, mild-mod MS (MPG 6 mmHg);  b.) TTE 04/02/2022: no MV stenosis   Murmur    Obesity hypoventilation syndrome 03/31/2012   OSA treated with BiPAP 09/29/2012   PAH (pulmonary artery hypertension)    a. 04/2019 RHC: RA 19, RV 80/20, PA 78/31 (51), PCWP 25, CO/CI 7.69/3.26. PVR 3.25 --> Sev PAH, likely primarily PV HTN; b.) RHC 04/04/2022: mRA 13, mPA 40, mPCWP 20, PA sat 59, AO sat 92, CO 7.63, CI 3.26, PVR 2.6 --> mod portal venous HTN   Pancreatitis    S/P CABG x 4 08/13/2002   a.) LIMA-LAD, LRA-OM, SVG-D1, SVG-dRCA   S/P gastric bypass    Synovitis of finger 06/11/2019   Trigger finger, unspecified little finger 12/23/2018   Type 2 diabetes mellitus with hyperglycemia, with long-term current use of insulin 09/09/2014   a.) has FreeStyle Libre CGM   Type 2 MI (myocardial infarction) ([place])    Ulcer 05/2016   Left shin   Wears glasses     Past Surgical History: Past Surgical History:  Procedure Laterality Date   CARDIAC CATHETERIZATION N/A 08/10/2002   CATARACT EXTRACTION W/PHACO Right 10/29/2017   Procedure: CATARACT EXTRACTION PHACO AND INTRAOCULAR LENS PLACEMENT ([place])  RIGHT DIABETIC;  Surgeon: [name], MD;  Location: [facility];  Service: Ophthalmology;  Laterality: Right   CATARACT EXTRACTION W/PHACO Left 11/18/2017   Procedure: CATARACT EXTRACTION PHACO AND INTRAOCULAR LENS PLACEMENT ([place]) LEFT IVA/TOPICAL;  Surgeon: [name], MD;  Location: [facility];  Service: Ophthalmology;  Laterality: Left   COLONOSCOPY WITH PROPOFOL N/A 04/22/2013   Procedure: COLONOSCOPY WITH PROPOFOL;  Surgeon: [name], MD;  Location: [facility];  Service: Endoscopy;  Laterality: N/A;   CORONARY ANGIOPLASTY WITH STENT PLACEMENT N/A 1996   CORONARY ARTERY BYPASS GRAFT N/A 08/13/2002  Procedure: 4v CORONARY ARTERY BYPASS GRAFTING; Location: [name]; Surgeon: [name], MD   CYSTOSCOPY WITH INSERTION OF UROLIFT N/A 12/25/2021   Procedure: CYSTOSCOPY WITH INSERTION OF UROLIFT;   Surgeon: [name], MD;  Location: [facility];  Service: Urology;  Laterality: N/A;   DG ANGIO AV SHUNT*L*     right and left upper arms   FASCIOTOMY  03/03/2012   Procedure: FASCIOTOMY;  Surgeon: [name], MD;  Location: [place];  Service: Orthopedics;  Laterality: Right;  FASCIOTOMY RIGHT SMALL FINGER   FASCIOTOMY Left 08/17/2013   Procedure: FASCIOTOMY LEFT RING;  Surgeon: [name], MD;  Location: [place];  Service: Orthopedics;  Laterality: Left;   INCISION AND DRAINAGE ABSCESS Left 10/15/2015   Procedure: INCISION AND DRAINAGE ABSCESS;  Surgeon: [name], MD;  Location: [facility];  Service: General;  Laterality: Left;   KIDNEY TRANSPLANT  09/13/2010   cadaver--at [facility] HEART CATH N/A 11/15/2016   Procedure: RIGHT HEART CATH;  Surgeon: [name], MD;  Location: [place] [facility];  Service: Cardiovascular;  Laterality: N/A;   RIGHT HEART CATH N/A 05/03/2019   Procedure: RIGHT HEART CATH;  Surgeon: [name], MD;  Location: [place] [facility];  Service: Cardiovascular;  Laterality: N/A;   RIGHT HEART CATH N/A 04/04/2022   Procedure: RIGHT HEART CATH;  Surgeon: [name], MD;  Location: [place] [facility];  Service: Cardiovascular;  Laterality: N/A;   ROUX-EN-Y GASTRIC BYPASS N/A 2015   TYMPANIC MEMBRANE REPAIR Left 03/2010   VASECTOMY     WART FULGURATION N/A 05/31/2022   Procedure: FULGURATION ANAL WART;  Surgeon: Benjamine Sprague, DO;  Location: [facility];  Service: General;  Laterality: N/A;    Family History: Family History  Problem Relation Age of Onset   Heart disease Father    Kidney failure Father    Kidney disease Father    Diabetes Maternal Grandmother    Breast cancer Maternal Grandmother    Valvular heart disease Mother    Liver cancer Paternal Uncle    Liver cancer Paternal Grandmother    Prostate cancer Neg Hx     Social History: Social History   Socioeconomic History   Marital  status: Married    Spouse name: Not on file   Number of children: 2   Years of education: 14   Highest education level: Associate degree: academic program  Occupational History   Occupation: Warden/ranger: unemployed    Comment: disabled due to kidney failure  Tobacco Use   Smoking status: Former    Types: Cigars    Quit date: 09/02/1994    Years since quitting: 27.8    Passive exposure: Never   Smokeless tobacco: Never   Tobacco comments:    Occasional, rare cigars  Vaping Use   Vaping Use: Never used  Substance and Sexual Activity   Alcohol use: Not Currently    Comment: occ   Drug use: No   Sexual activity: Not on file  Other Topics Concern   Not on file  Social History Narrative   Has living will   Wife is health care POA   Would accept resuscitation attempts   No tube feedings if cognitively unaware   Social Determinants of Health   Financial Resource Strain: Not on file  Food Insecurity: No Food Insecurity (04/01/2022)   Hunger Vital Sign    Worried About Running Out of  Food in the Last Year: Never true    [place] in the Last Year: Never true  Transportation Needs: No Transportation Needs (04/01/2022)   PRAPARE - Hydrologist (Medical): No    Lack of Transportation (Non-Medical): No  Physical Activity: Not on file  Stress: Not on file  Social Connections: Not on file    Allergies:  Allergies  Allergen Reactions   Contrast Media [Iodinated Contrast Media] Other (See Comments)    Kidney transplant   Iodine Other (See Comments)    Other reaction(s): Other (See Comments) Kidney transplant   Ibuprofen Other (See Comments)    Due to kidney transplant Due to kidney transplant Due to kidney transplant    Objective:    Vital Signs:   Temp:  [97.4 F (36.3 C)-98.5 F (36.9 C)] 98.2 F (36.8 C) (04/01 0738) Pulse Rate:  [78-87] 87 (04/01 0738) Resp:  [16-18] 18 (04/01 0738) BP:  (113-153)/(73-84) 134/83 (04/01 0738) SpO2:  [93 %-96 %] 96 % (04/01 0738) Last BM Date : 06/23/22  Weight change: Filed Weights   06/21/22 0840  Weight: 124.3 kg    Intake/Output:   Intake/Output Summary (Last 24 hours) at 06/24/2022 1225 Last data filed at 06/24/2022 0800 Gross per 24 hour  Intake 954.24 ml  Output 950 ml  Net 4.24 ml      Physical Exam    General:  Obese male Sitting up on side of bed No resp difficulty HEENT: normal Neck: supple. JVP to jaw . Carotids 2+ bilat; no bruits. No lymphadenopathy or thyromegaly appreciated. Cor: PMI nondisplaced. Irregular rate & rhythm. No rubs, gallops or murmurs. Lungs: clear Abdomen: obese soft, nontender, nondistended. No hepatosplenomegaly. No bruits or masses. Good bowel sounds. Extremities: no cyanosis, clubbing, rash, 2+ edema with chronic venous stasis changes Neuro: alert & orientedx3, cranial nerves grossly intact. moves all 4 extremities w/o difficulty. Affect pleasant   Telemetry   AF 80s Personally reviewed   EKG    AF 83 IVCD. Anterior qs Personally reviewed  Labs   Basic Metabolic Panel: Recent Labs  Lab 06/21/22 0830 06/22/22 0230 06/22/22 0233 06/23/22 0500 06/24/22 0326  NA 140  --  139 139 139  K 4.0  --  4.0 3.5 3.5  CL 98  --  99 101 100  CO2 32  --  28 29 30   GLUCOSE 93  --  96 125* 156*  BUN 63*  --  60* 63* 73*  CREATININE 2.18*  --  2.20* 2.36* 2.61*  CALCIUM 9.1  --  8.8* 9.0 9.0  MG  --  2.0  --  2.3 2.2  PHOS  --  2.8  --  4.4 3.1    Liver Function Tests: Recent Labs  Lab 06/21/22 0830 06/22/22 0233  AST 25 27  ALT 14 14  ALKPHOS 114 91  BILITOT 1.3* 1.6*  PROT 7.1 6.5  ALBUMIN 3.4* 2.9*   No results for input(s): "LIPASE", "AMYLASE" in the last 168 hours. Recent Labs  Lab 06/21/22 1119  AMMONIA 19    CBC: Recent Labs  Lab 06/21/22 0830 06/22/22 0233 06/23/22 0500 06/24/22 0326  WBC 10.9* 10.1 4.5 5.0  NEUTROABS 11.1*  --   --   --   HGB 12.3* 11.2*  10.5* 10.3*  HCT 41.9 38.2* 35.7* 35.3*  MCV 78.3* 79.1* 78.8* 78.8*  PLT 142* 108* 91* 95*    Cardiac Enzymes: Recent Labs  Lab 06/21/22 0830  CKTOTAL  38*    BNP: BNP (last 3 results) Recent Labs    05/09/22 1218 05/30/22 1149 06/21/22 0848  BNP 352.3* 430.9* 465.0*    ProBNP (last 3 results) No results for input(s): "PROBNP" in the last 8760 hours.   CBG: Recent Labs  Lab 06/23/22 2034 06/24/22 0029 06/24/22 0452 06/24/22 0738 06/24/22 1121  GLUCAP 209* 119* 137* 185* 210*    Coagulation Studies: No results for input(s): "LABPROT", "INR" in the last 72 hours.   Imaging   No results found.   Medications:     Current Medications:  apixaban  5 mg Oral BID   azithromycin  500 mg Oral Daily   buPROPion  150 mg Oral BID   insulin aspart  0-15 Units Subcutaneous Q4H   [START ON 06/25/2022] iron polysaccharides  150 mg Oral Daily   montelukast  10 mg Oral QHS   mycophenolate  360 mg Oral BID   pantoprazole  40 mg Oral QHS   predniSONE  5 mg Oral Daily   rosuvastatin  10 mg Oral QHS   saccharomyces boulardii  250 mg Oral BID   sulfamethoxazole-trimethoprim  1 tablet Oral Q M,W,F   tacrolimus  2 mg Oral BID   [START ON 06/25/2022] tamsulosin  0.4 mg Oral QPC breakfast   [START ON 06/25/2022] torsemide  100 mg Oral Daily    Infusions:  ceFEPime (MAXIPIME) IV 2 g (06/24/22 0907)      Assessment/Plan     1.  Pyelonephritis/serratia sepsis -On antibiotics as per primary team  2.  Acute on chronic systolic HF - Echo EF 40 to 45% - Remains volume overloaded in setting of IVF resuscitation for sepsis. Poor response to po torsemide - Will give lasix 80iv + metolazone 5 and assess response - Daily weights   3.  CAD/CABG -Eliquis, Crestor - No s/s angina   4.  Permanent A-fib - Rate controlled -Continue Eliquis -History of bradycardia with beta-blockers.  5. ESRD s/p renal transplant with subsequent CKD 3B-IV - Baseline Scr 2.5-2.7 - stable -  watch closely with attempts at diuresis. May need to have Renal see - immunosuppressant regimen per primary  Length of Stay: 3  Glori Bickers, MD  06/24/2022, 12:25 PM  Advanced Heart Failure Team Pager [phone] (M-F; 7a - 5p)  Please contact [facility] for night-coverage after hours (4p -7a ) and weekends on [email or web address]

## 2022-06-24 NOTE — Progress Notes (Signed)
PHARMACIST - PHYSICIAN COMMUNICATION  CONCERNING: Antibiotic IV to Oral Route Change Policy  RECOMMENDATION: This patient is receiving azithromycin by the intravenous route.  Based on criteria approved by the Pharmacy and Therapeutics Committee, the antibiotic(s) is/are being converted to the equivalent oral dose form(s).  DESCRIPTION: These criteria include: Patient being treated for a respiratory tract infection, urinary tract infection, cellulitis or clostridium difficile associated diarrhea if on metronidazole The patient is not neutropenic and does not exhibit a GI malabsorption state The patient is eating (either orally or via tube) and/or has been taking other orally administered medications for a least 24 hours The patient is improving clinically and has a Tmax < 100.5  If you have questions about this conversion, please contact the [facility] 06/24/22

## 2022-06-24 NOTE — Inpatient Diabetes Management (Signed)
Inpatient Diabetes Program Recommendations  AACE/ADA: New Consensus Statement on Inpatient Glycemic Control (2015)  Target Ranges:  Prepandial:   less than 140 mg/dL      Peak postprandial:   less than 180 mg/dL (1-2 hours)      Critically ill patients:  140 - 180 mg/dL   Lab Results  Component Value Date   GLUCAP 185 (H) 06/24/2022   HGBA1C 8.2 (H) 06/21/2022    Review of Glycemic Control  Latest Reference Range & Units 06/23/22 08:11 06/23/22 11:53 06/23/22 15:58 06/23/22 20:34 06/24/22 00:29 06/24/22 04:52 06/24/22 07:38  Glucose-Capillary 70 - 99 mg/dL 115 (H) 252 (H) 351 (H) 209 (H) 119 (H) 137 (H) 185 (H)  (H): Data is abnormally high  Diabetes history: DM2 Outpatient Diabetes medications: Semglee 40 units QHS, Humalog 10-20 units sliding scale, Farxiga 10 mg QD Current orders for Inpatient glycemic control: Novolog 0-15 units Q4H, Prednisone 5 mg QD  Inpatient Diabetes Program Recommendations:    Might consider:  Novolog 0-15 units TID and 0-5 QHS if eating Novolog 2-3 units TID with meals Discontinue Farxiga at discharge with bacteria in urine?    Will continue to follow while inpatient.  Thank you, Reche Dixon, MSN, [place] [facility] Coordinator Inpatient Diabetes Program [phone] (team pager from 8a-5p)

## 2022-06-24 NOTE — Progress Notes (Signed)
[facility] Progress Note  Patient: [name].    YA:[id number]  DOA: 06/21/2022     Date of Service: the patient was seen and examined on 06/24/2022  Chief Complaint  Patient presents with   Shortness of Breath   Fever   Hematuria   Brief hospital course: Carl Shaine Enyeart. is a 62 y.o. male with PMH of CAD s/p CABG in 2005, ischemic cardiomyopathy LVEF 40 to 45% as per echo Jan 2024, pulmonary hypertension, chronic A. Fib on Eliquis, aortic stenosis, HTN, type 2 DM, ESRD s/p renal transplant in 2012 now with stage III CKD, OSA on CPAP, gout presenting with sepsis, pyelonephritis.  ED w/up: Tmax 101.5, HR 100s, BP 80s to 100s over 50s, on 2 L nasal cannula to keep O2 sats greater than 92%.  Wbx 10.9, Plts 142, LA 2.1---1.8, roponin 90s to 120s, COVID flu and RSV negative. BNP 465. Creatinine 2.2.  CT head grossly stable. CT ch/abd/p: hydronephrosis of transplanted kidney. Noted 2.9 x 1 cm nodular Consolidative opacity in the right lower lobe present since 2021. Changes in liver consistent with cirrhosis splenomegaly with some concern for portal vein hypertension.   Assessment and Plan:  Sepsis, Serratia bacteremia 2/2 pyelonephritis and possible atypical pneumonia Sepsis criteria Tmax 101.5, heart rate 100, hypotensive, WBC elevated, pyelonephritis, lactic acid 2.1, hypoxic respiratory failure S/p IV fluid, DC'd due to pulmonary edema and respiratory failure   Pyelonephritis Baseline history of end-stage renal disease status post renal transplant on chronic immunosuppression  Pending tacrolimus trough level. Continue tacrolimus 2g PO bid.  Continue mycophenolate and prednisone Continue PJP prophylaxis with Bactrim S/p vancomycin and Flagyl Continue cefepime 2 g every 12 hourly Urine cx: Serratia marcescens  Blood culture: Serratia marcescens, sensitive to cefepime Repeat blood cultures NGTD  Consulted ID for antibiotics and duration    Acute hypoxic  respiratory failure, multifactorial could be due to pneumonia versus CHF Started empiric azithromycin 500 mg IV daily x 5 days S/p Lasix 60 mg x 1 dose, started torsemide 100 mg p.o. twice daily Continue diuresis as per Nephrology and Cardiology Continue supplemental O2 inhalation and gradually wean off  CAD (coronary artery disease) S/p CABG in 2005 No active chest pain though with troponins in the 90s on presentation Suspect demand ischemia in the setting of sepsis and pyelonephritis A-fib with RVR on EKG Continue Eliquis Continue telemetry monitor Trop 97--126  consulted cardiology    Chronic systolic congestive heart failure  2D echo January 2024 with LVEF of 40-45%  January 2023 admission does mention episode of cardiorenal syndrome assd w/ diuresis  Noted to be followed by the heart failure clinic Dry weight at hospital dc 03/2022 was 116kg  BNP 465 elevated  Continue torsemide 100 mg p.o. twice daily 4/1 Lasix 80 mg x 1 dose and metolazone 5 mg oral x 1 dose given  Cardiology following   Essential hypertension BP 100s/50s in setting of sepsis, so BP meds were held Blood pressure gradually improved Use Hydralazine prn   H/o asthma, patient has shortness of breath most likely due to pneumonia and CHF Continue management as above Started DuoNeb every 6 hourly as needed   Type II diabetes mellitus with renal manifestations SSI  A1C     GERD (gastroesophageal reflux disease) PPI   Pulmonary hypertension due to left heart disease Primarily venous hypertension by [place] 03/2021 admission,  On imdur which has been held due to low blood pressure Cardiology following   Body mass index is  39.31 kg/m.  Interventions:       Diet: Heart healthy/carb modified diet DVT Prophylaxis: Therapeutic Anticoagulation with Eliquis    Advance goals of care discussion: Full code  Family Communication: family was present at bedside, at the time of interview.  The pt provided  permission to discuss medical plan with the family. Opportunity was given to ask question and all questions were answered satisfactorily.   Disposition:  Pt is from Home, admitted with sepsis, pyelonephritis, still on IV antibiotics, need to repeat blood cultures, which precludes a safe discharge. Discharge to home, when clinically stable.  May need few days to improve.  Subjective: No significant events overnight, patient was sitting on the recliner comfortably on room air, still feeling some shortness of breath, denies any abdominal pain, no chest pain or palpitation, no any other active issues.   Physical Exam: General: NAD, lying comfortably, mild respiratory distress Eyes: PERRLA ENT: Oral Mucosa Clear, moist  Neck: no JVD,  Cardiovascular: S1 and S2 Present, no Murmur,  Respiratory: Good air entry bilaterally, increased respiratory efforts, bilateral crackles, no wheezing appreciated Abdomen: Bowel Sound present, Soft and no tenderness,  Skin: no rashes Extremities: 2+ Pedal edema, no calf tenderness, chronic venous stasis pigmentation Neurologic: without any new focal findings Gait not checked due to patient safety concerns  Vitals:   06/23/22 2031 06/24/22 0455 06/24/22 0738 06/24/22 1503  BP: 128/73 (!) 153/84 134/83 (!) 149/84  Pulse: 80 87 87 99  Resp:  16 18 (!) 24  Temp: 97.8 F (36.6 C) (!) 97.4 F (36.3 C) 98.2 F (36.8 C) 98.4 F (36.9 C)  TempSrc: Oral Oral Oral   SpO2: 93% 94% 96% 91%  Weight:      Height:        Intake/Output Summary (Last 24 hours) at 06/24/2022 1506 Last data filed at 06/24/2022 1000 Gross per 24 hour  Intake 1194.24 ml  Output 950 ml  Net 244.24 ml   Filed Weights   06/21/22 0840  Weight: 124.3 kg    Data Reviewed: I have personally reviewed and interpreted daily labs, tele strips, imagings as discussed above. I reviewed all nursing notes, pharmacy notes, vitals, pertinent old records I have discussed plan of care as described  above with RN and patient/family.  CBC: Recent Labs  Lab 06/21/22 0830 06/22/22 0233 06/23/22 0500 06/24/22 0326  WBC 10.9* 10.1 4.5 5.0  NEUTROABS 11.1*  --   --   --   HGB 12.3* 11.2* 10.5* 10.3*  HCT 41.9 38.2* 35.7* 35.3*  MCV 78.3* 79.1* 78.8* 78.8*  PLT 142* 108* 91* 95*   Basic Metabolic Panel: Recent Labs  Lab 06/21/22 0830 06/22/22 0230 06/22/22 0233 06/23/22 0500 06/24/22 0326  NA 140  --  139 139 139  K 4.0  --  4.0 3.5 3.5  CL 98  --  99 101 100  CO2 32  --  28 29 30   GLUCOSE 93  --  96 125* 156*  BUN 63*  --  60* 63* 73*  CREATININE 2.18*  --  2.20* 2.36* 2.61*  CALCIUM 9.1  --  8.8* 9.0 9.0  MG  --  2.0  --  2.3 2.2  PHOS  --  2.8  --  4.4 3.1    Studies: No results found.  Scheduled Meds:  apixaban  5 mg Oral BID   azithromycin  500 mg Oral Daily   buPROPion  150 mg Oral BID   insulin aspart  0-15 Units Subcutaneous  Q4H   [START ON 06/25/2022] iron polysaccharides  150 mg Oral Daily   montelukast  10 mg Oral QHS   mycophenolate  360 mg Oral BID   pantoprazole  40 mg Oral QHS   predniSONE  5 mg Oral Daily   rosuvastatin  10 mg Oral QHS   saccharomyces boulardii  250 mg Oral BID   sulfamethoxazole-trimethoprim  1 tablet Oral Q M,W,F   tacrolimus  2 mg Oral BID   [START ON 06/25/2022] tamsulosin  0.4 mg Oral QPC breakfast   [START ON 06/25/2022] torsemide  100 mg Oral Daily   Continuous Infusions:  ceFEPime (MAXIPIME) IV 2 g (06/24/22 0907)   PRN Meds: acetaminophen, alum & mag hydroxide-simeth, budesonide (PULMICORT) nebulizer solution, hydrOXYzine, ipratropium-albuterol, ondansetron **OR** ondansetron (ZOFRAN) IV  Time spent: 35 minutes  Author: Val Riles. MD [facility] 06/24/2022 3:06 PM  To reach On-call, see care teams to locate the attending and reach out to them via [email or web address]. If 7PM-7AM, please contact night-coverage If you still have difficulty reaching the attending provider, please page the [facility] (Director on Call) for  [facility] on amion for assistance.

## 2022-06-24 NOTE — Progress Notes (Signed)
[facility]  ROUNDING NOTE   Subjective:   [name] presents with shortness of breath, dyspnea on exertion, intermittent fevers and gross hematuria. Patient states he has been taking all his medications including apixaban and his immunomodulating agents: tacrolimus, mycophenolate and prednisone.    Patient seen sitting up in chair States he feels anxious, but can't verbalize cause Denies pain Denies nausea or vomiting  Patient seen later during rounds with attending Patient reports feeling short of breath, currently on room air  Creatinine 2.6 UOP 936ml   Objective:  Vital signs in last 24 hours:  Temp:  [97.4 F (36.3 C)-98.5 F (36.9 C)] 98.2 F (36.8 C) (04/01 0738) Pulse Rate:  [78-87] 87 (04/01 0738) Resp:  [16-18] 18 (04/01 0738) BP: (113-153)/(73-84) 134/83 (04/01 0738) SpO2:  [93 %-96 %] 96 % (04/01 0738)  Weight change:  Filed Weights   06/21/22 0840  Weight: 124.3 kg    Intake/Output: I/O last 3 completed shifts: In: 1226.8 [P.O.:480; IV Piggyback:746.8] Out: 1850 [Urine:1850]   Intake/Output this shift:  Total I/O In: 240 [P.O.:240] Out: 300 [Urine:300]  Physical Exam: General: Ill appearing  Head: Normocephalic, atraumatic. Moist oral mucosal membranes  Eyes: Anicteric  Lungs:  Clear to auscultation, room air  Heart: Irregular  Abdomen:  Obese, LLQ allograft kidney - no tenderness  Extremities:  no peripheral edema.  Neurologic: Nonfocal, moving all four extremities  Skin: No lesions       Basic Metabolic Panel: Recent Labs  Lab 06/21/22 0830 06/22/22 0230 06/22/22 0233 06/23/22 0500 06/24/22 0326  NA 140  --  139 139 139  K 4.0  --  4.0 3.5 3.5  CL 98  --  99 101 100  CO2 32  --  28 29 30   GLUCOSE 93  --  96 125* 156*  BUN 63*  --  60* 63* 73*  CREATININE 2.18*  --  2.20* 2.36* 2.61*  CALCIUM 9.1  --  8.8* 9.0 9.0  MG  --  2.0  --  2.3 2.2  PHOS  --  2.8  --  4.4 3.1     Liver Function  Tests: Recent Labs  Lab 06/21/22 0830 06/22/22 0233  AST 25 27  ALT 14 14  ALKPHOS 114 91  BILITOT 1.3* 1.6*  PROT 7.1 6.5  ALBUMIN 3.4* 2.9*    No results for input(s): "LIPASE", "AMYLASE" in the last 168 hours. Recent Labs  Lab 06/21/22 1119  AMMONIA 19     CBC: Recent Labs  Lab 06/21/22 0830 06/22/22 0233 06/23/22 0500 06/24/22 0326  WBC 10.9* 10.1 4.5 5.0  NEUTROABS 11.1*  --   --   --   HGB 12.3* 11.2* 10.5* 10.3*  HCT 41.9 38.2* 35.7* 35.3*  MCV 78.3* 79.1* 78.8* 78.8*  PLT 142* 108* 91* 95*     Cardiac Enzymes: Recent Labs  Lab 06/21/22 0830  CKTOTAL 38*     BNP: Invalid input(s): "POCBNP"  CBG: Recent Labs  Lab 06/23/22 2034 06/24/22 0029 06/24/22 0452 06/24/22 0738 06/24/22 1121  GLUCAP 209* 119* 137* 185* 210*     Microbiology: Results for orders placed or performed during the hospital encounter of 06/21/22  Blood culture (routine x 2)     Status: Abnormal   Collection Time: 06/21/22  8:38 AM   Specimen: BLOOD  Result Value Ref Range Status   Specimen Description   Final    BLOOD RIGHT ARM Performed at [facility], [address] [place], [place] [address]    Special Requests   Final    NONE Performed at [facility], [address] [place], [place] [address]    Culture  Setup Time   Final    Organism ID to follow GRAM NEGATIVE RODS AEROBIC BOTTLE ONLY CRITICAL RESULT CALLED TO, READ BACK BY AND VERIFIED WITH: RAQUEL RODRIGUEZ GUZMAN AT [id number] 06/22/22.PMF  REVIEWED BY A. LAFRANCE Performed at [facility], [place] [address], [place], [place] [address]    Culture SERRATIA MARCESCENS (A)  Final   Report Status 06/24/2022 FINAL  Final   Organism ID, Bacteria SERRATIA MARCESCENS  Final      Susceptibility   Serratia marcescens - MIC*    CEFEPIME <=0.12 SENSITIVE Sensitive     CEFTAZIDIME <=1 SENSITIVE Sensitive     CEFTRIAXONE <=0.25 SENSITIVE Sensitive     CIPROFLOXACIN 0.5 INTERMEDIATE  Intermediate     GENTAMICIN <=1 SENSITIVE Sensitive     TRIMETH/SULFA >=320 RESISTANT Resistant     * SERRATIA MARCESCENS  Blood Culture ID Panel (Reflexed)     Status: Abnormal   Collection Time: 06/21/22  8:38 AM  Result Value Ref Range Status   Enterococcus faecalis NOT DETECTED NOT DETECTED Final   Enterococcus Faecium NOT DETECTED NOT DETECTED Final   Listeria monocytogenes NOT DETECTED NOT DETECTED Final   Staphylococcus species NOT DETECTED NOT DETECTED Final   Staphylococcus aureus (BCID) NOT DETECTED NOT DETECTED Final   Staphylococcus epidermidis NOT DETECTED NOT DETECTED Final   Staphylococcus lugdunensis NOT DETECTED NOT DETECTED Final   Streptococcus species NOT DETECTED NOT DETECTED Final   Streptococcus agalactiae NOT DETECTED NOT DETECTED Final   Streptococcus pneumoniae NOT DETECTED NOT DETECTED Final   Streptococcus pyogenes NOT DETECTED NOT DETECTED Final   A.calcoaceticus-baumannii NOT DETECTED NOT DETECTED Final   Bacteroides fragilis NOT DETECTED NOT DETECTED Final   Enterobacterales DETECTED (A) NOT DETECTED Final    Comment: Enterobacterales represent a large order of gram negative bacteria, not a single organism. CRITICAL RESULT CALLED TO, READ BACK BY AND VERIFIED WITH: RAQUEL RODRIGUEZ GUZMAN AT [id number] 06/22/22.PMF    Enterobacter cloacae complex NOT DETECTED NOT DETECTED Final   Escherichia coli NOT DETECTED NOT DETECTED Final   Klebsiella aerogenes NOT DETECTED NOT DETECTED Final   Klebsiella oxytoca NOT DETECTED NOT DETECTED Final   Klebsiella pneumoniae NOT DETECTED NOT DETECTED Final   Proteus species NOT DETECTED NOT DETECTED Final   Salmonella species NOT DETECTED NOT DETECTED Final   Serratia marcescens DETECTED (A) NOT DETECTED Final    Comment: CRITICAL RESULT CALLED TO, READ BACK BY AND VERIFIED WITH: RAQUEL RODRIGUEZ GUZMAN AT [id number] 06/22/22.PMF    Haemophilus influenzae NOT DETECTED NOT DETECTED Final   Neisseria meningitidis NOT DETECTED NOT  DETECTED Final   Pseudomonas aeruginosa NOT DETECTED NOT DETECTED Final   Stenotrophomonas maltophilia NOT DETECTED NOT DETECTED Final   Candida albicans NOT DETECTED NOT DETECTED Final   Candida auris NOT DETECTED NOT DETECTED Final   Candida glabrata NOT DETECTED NOT DETECTED Final   Candida krusei NOT DETECTED NOT DETECTED Final   Candida parapsilosis NOT DETECTED NOT DETECTED Final   Candida tropicalis NOT DETECTED NOT DETECTED Final   Cryptococcus neoformans/gattii NOT DETECTED NOT DETECTED Final   CTX-M ESBL NOT DETECTED NOT DETECTED Final   Carbapenem resistance IMP NOT DETECTED NOT DETECTED Final   Carbapenem resistance KPC NOT DETECTED NOT DETECTED Final   Carbapenem resistance NDM NOT DETECTED NOT DETECTED Final   Carbapenem resist  OXA 48 LIKE NOT DETECTED NOT DETECTED Final   Carbapenem resistance VIM NOT DETECTED NOT DETECTED Final    Comment: Performed at [facility], [address] [place], [place] [address]  Urine Culture     Status: Abnormal   Collection Time: 06/21/22  8:48 AM   Specimen: Urine, Clean Catch  Result Value Ref Range Status   Specimen Description   Final    URINE, CLEAN CATCH Performed at [facility], [address], [place], [place] [address]    Special Requests   Final    NONE Performed at [facility], [address] [place], [place] [address]    Culture >=100,000 COLONIES/mL SERRATIA MARCESCENS (A)  Final   Report Status 06/23/2022 FINAL  Final   Organism ID, Bacteria SERRATIA MARCESCENS (A)  Final      Susceptibility   Serratia marcescens - MIC*    CEFEPIME <=0.12 SENSITIVE Sensitive     CEFTRIAXONE <=0.25 SENSITIVE Sensitive     CIPROFLOXACIN 0.5 INTERMEDIATE Intermediate     GENTAMICIN <=1 SENSITIVE Sensitive     NITROFURANTOIN 256 RESISTANT Resistant     TRIMETH/SULFA >=320 RESISTANT Resistant     * >=100,000 COLONIES/mL SERRATIA MARCESCENS  Resp panel by RT-PCR (RSV, Flu A&B, Covid) Anterior Nasal Swab      Status: None   Collection Time: 06/21/22  8:48 AM   Specimen: Anterior Nasal Swab  Result Value Ref Range Status   SARS Coronavirus 2 by RT PCR NEGATIVE NEGATIVE Final    Comment: (NOTE) SARS-CoV-2 target nucleic acids are NOT DETECTED.  The SARS-CoV-2 RNA is generally detectable in upper respiratory specimens during the acute phase of infection. The lowest concentration of SARS-CoV-2 viral copies this assay can detect is 138 copies/mL. A negative result does not preclude SARS-Cov-2 infection and should not be used as the sole basis for treatment or other patient management decisions. A negative result may occur with  improper specimen collection/handling, submission of specimen other than nasopharyngeal swab, presence of viral mutation(s) within the areas targeted by this assay, and inadequate number of viral copies(<138 copies/mL). A negative result must be combined with clinical observations, patient history, and epidemiological information. The expected result is Negative.  Fact Sheet for Patients:  EntrepreneurPulse.com.au  Fact Sheet for Healthcare Providers:  IncredibleEmployment.be  This test is no t yet approved or cleared by the [place] FDA and  has been authorized for detection and/or diagnosis of SARS-CoV-2 by FDA under an Emergency Use Authorization (EUA). This EUA will remain  in effect (meaning this test can be used) for the duration of the COVID-19 declaration under Section 564(b)(1) of the Act, 21 U.S.C.section 360bbb-3(b)(1), unless the authorization is terminated  or revoked sooner.       Influenza A by PCR NEGATIVE NEGATIVE Final   Influenza B by PCR NEGATIVE NEGATIVE Final    Comment: (NOTE) The Xpert Xpress SARS-CoV-2/FLU/RSV plus assay is intended as an aid in the diagnosis of influenza from Nasopharyngeal swab specimens and should not be used as a sole basis for treatment. Nasal washings and aspirates are  unacceptable for Xpert Xpress SARS-CoV-2/FLU/RSV testing.  Fact Sheet for Patients: EntrepreneurPulse.com.au  Fact Sheet for Healthcare Providers: IncredibleEmployment.be  This test is not yet approved or cleared by the [place] FDA and has been authorized for detection and/or diagnosis of SARS-CoV-2 by FDA under an Emergency Use Authorization (EUA). This EUA will remain in effect (meaning this test can be used) for the duration of the COVID-19  declaration under Section 564(b)(1) of the Act, 21 U.S.C. section 360bbb-3(b)(1), unless the authorization is terminated or revoked.     Resp Syncytial Virus by PCR NEGATIVE NEGATIVE Final    Comment: (NOTE) Fact Sheet for Patients: EntrepreneurPulse.com.au  Fact Sheet for Healthcare Providers: IncredibleEmployment.be  This test is not yet approved or cleared by the [place] FDA and has been authorized for detection and/or diagnosis of SARS-CoV-2 by FDA under an Emergency Use Authorization (EUA). This EUA will remain in effect (meaning this test can be used) for the duration of the COVID-19 declaration under Section 564(b)(1) of the Act, 21 U.S.C. section 360bbb-3(b)(1), unless the authorization is terminated or revoked.  Performed at [facility], [address] [place], [place] [address]   Culture, blood (x 2)     Status: None (Preliminary result)   Collection Time: 06/21/22  1:20 PM   Specimen: BLOOD  Result Value Ref Range Status   Specimen Description BLOOD BLOOD RIGHT ARM  Final   Special Requests   Final    BOTTLES DRAWN AEROBIC AND ANAEROBIC Blood Culture adequate volume   Culture   Final    NO GROWTH 3 DAYS Performed at [facility], [address], [place], [place] [address]    Report Status PENDING  Incomplete  Culture, blood (x 2)     Status: None (Preliminary result)   Collection Time: 06/21/22  1:20 PM    Specimen: BLOOD  Result Value Ref Range Status   Specimen Description BLOOD BLOOD RIGHT HAND  Final   Special Requests   Final    BOTTLES DRAWN AEROBIC AND ANAEROBIC Blood Culture results may not be optimal due to an excessive volume of blood received in culture bottles   Culture   Final    NO GROWTH 3 DAYS Performed at [facility], [address] [place], [place] [address]    Report Status PENDING  Incomplete  MRSA Next Gen by PCR, Nasal     Status: None   Collection Time: 06/21/22  1:57 PM   Specimen: Nasal Mucosa; Nasal Swab  Result Value Ref Range Status   MRSA by PCR Next Gen NOT DETECTED NOT DETECTED Final    Comment: (NOTE) The GeneXpert MRSA Assay (FDA approved for NASAL specimens only), is one component of a comprehensive MRSA colonization surveillance program. It is not intended to diagnose MRSA infection nor to guide or monitor treatment for MRSA infections. Test performance is not FDA approved in patients less than 28 years old. Performed at [facility], [address] [place], [place] [address]   Culture, blood (Routine X 2) w Reflex to ID Panel     Status: None (Preliminary result)   Collection Time: 06/23/22  5:00 AM   Specimen: BLOOD  Result Value Ref Range Status   Specimen Description BLOOD BLOOD RIGHT ARM  Final   Special Requests   Final    BOTTLES DRAWN AEROBIC AND ANAEROBIC Blood Culture results may not be optimal due to an excessive volume of blood received in culture bottles   Culture   Final    NO GROWTH 1 DAY Performed at [facility], [address] [place], [place] [address]    Report Status PENDING  Incomplete  Culture, blood (Routine X 2) w Reflex to ID Panel     Status: None (Preliminary result)   Collection Time: 06/23/22  5:14 AM   Specimen: BLOOD  Result Value Ref Range Status   Specimen Description BLOOD BLOOD  RIGHT HAND  Final   Special Requests   Final    BOTTLES DRAWN AEROBIC AND ANAEROBIC Blood Culture  results may not be optimal due to an excessive volume of blood received in culture bottles   Culture   Final    NO GROWTH 1 DAY Performed at [facility], [address], [place], [place] [address]    Report Status PENDING  Incomplete   *Note: Due to a large number of results and/or encounters for the requested time period, some results have not been displayed. A complete set of results can be found in Results Review.    Coagulation Studies: No results for input(s): "LABPROT", "INR" in the last 72 hours.   Urinalysis: No results for input(s): "COLORURINE", "LABSPEC", "PHURINE", "GLUCOSEU", "HGBUR", "BILIRUBINUR", "KETONESUR", "PROTEINUR", "UROBILINOGEN", "NITRITE", "LEUKOCYTESUR" in the last 72 hours.  Invalid input(s): "APPERANCEUR"     Imaging: No results found.   Medications:    ceFEPime (MAXIPIME) IV 2 g (06/24/22 [id number])    apixaban  5 mg Oral BID   azithromycin  500 mg Oral Daily   buPROPion  150 mg Oral BID   insulin aspart  0-15 Units Subcutaneous Q4H   [START ON 06/25/2022] iron polysaccharides  150 mg Oral Daily   montelukast  10 mg Oral QHS   mycophenolate  360 mg Oral BID   pantoprazole  40 mg Oral QHS   predniSONE  5 mg Oral Daily   rosuvastatin  10 mg Oral QHS   saccharomyces boulardii  250 mg Oral BID   sulfamethoxazole-trimethoprim  1 tablet Oral Q M,W,F   tacrolimus  2 mg Oral BID   [START ON 06/25/2022] tamsulosin  0.4 mg Oral QPC breakfast   [START ON 06/25/2022] torsemide  100 mg Oral Daily   acetaminophen, alum & mag hydroxide-simeth, budesonide (PULMICORT) nebulizer solution, hydrOXYzine, ipratropium-albuterol, ondansetron **OR** ondansetron (ZOFRAN) IV  Assessment/ Plan:  [name] is a 63 y.o. Hispanic male renal transplant in 2012, coronary artery disease status post CABG, atrial fibrillation, congestive heart failure, gout, GERD, hyperlipidemia, pulmonary hypertension, status post gastric bypass, diabetes mellitus type II,  who was admitted to [facility] on 06/21/2022 for Pyelonephritis [N12] Acute respiratory failure with hypoxia [J96.01] Sepsis [A41.9] Sepsis, due to unspecified organism, unspecified whether acute organ dysfunction present [A41.9]    Acute kidney injury on chronic kidney disease stage IIIA-T: baseline creatinine of 1.63, GFR of 47 on 05/30/22. Followed by [facility]. Transplant doppler reviewed with patient.  - Pending tacrolimus trough level. Continue tacrolimus 2g PO bid.  - continue mycophenolate and prednisone - Continue PJP prophylaxis with Bactrim. - Renal function slowly decrease. Monitoring - No acute need for dialysis   Pyelonephritis/urinary tract infection/sepsis: cultures growing serratia marcences in urine and blood from 3/29. Pending sensitivities.  - empiric antibiotics: cefepime   Hypotension: with septic shock. Not requiring vasopressors. Holding home blood pressure regimen: hydralazine, isosorbide mononitrate, metolazone, tamsulosin and torsemide. - holding all agents except torsemide.  -Decreased to Torsemide 100mg  daily   Diabetes mellitus type II with chronic kidney disease: insulin dependent. Hemoglobin A1c of 7.7% on 05/15/22. Holding dapagliflozin.    LOS: 3   4/1/202412:54 PM

## 2022-06-25 DIAGNOSIS — N12 Tubulo-interstitial nephritis, not specified as acute or chronic: Secondary | ICD-10-CM | POA: Diagnosis not present

## 2022-06-25 DIAGNOSIS — R7881 Bacteremia: Secondary | ICD-10-CM | POA: Diagnosis not present

## 2022-06-25 DIAGNOSIS — N390 Urinary tract infection, site not specified: Secondary | ICD-10-CM | POA: Diagnosis not present

## 2022-06-25 DIAGNOSIS — A498 Other bacterial infections of unspecified site: Secondary | ICD-10-CM | POA: Diagnosis not present

## 2022-06-25 LAB — GLUCOSE, CAPILLARY
Glucose-Capillary: 117 mg/dL — ABNORMAL HIGH (ref 70–99)
Glucose-Capillary: 151 mg/dL — ABNORMAL HIGH (ref 70–99)
Glucose-Capillary: 288 mg/dL — ABNORMAL HIGH (ref 70–99)
Glucose-Capillary: 347 mg/dL — ABNORMAL HIGH (ref 70–99)
Glucose-Capillary: 352 mg/dL — ABNORMAL HIGH (ref 70–99)
Glucose-Capillary: 360 mg/dL — ABNORMAL HIGH (ref 70–99)

## 2022-06-25 LAB — BASIC METABOLIC PANEL
Anion gap: 9 (ref 5–15)
BUN: 82 mg/dL — ABNORMAL HIGH (ref 8–23)
CO2: 28 mmol/L (ref 22–32)
Calcium: 9.1 mg/dL (ref 8.9–10.3)
Chloride: 102 mmol/L (ref 98–111)
Creatinine, Ser: 3.16 mg/dL — ABNORMAL HIGH (ref 0.61–1.24)
GFR, Estimated: 21 mL/min — ABNORMAL LOW (ref >60)
Glucose, Bld: 105 mg/dL — ABNORMAL HIGH (ref 70–99)
Potassium: 3.1 mmol/L — ABNORMAL LOW (ref 3.5–5.1)
Sodium: 139 mmol/L (ref 135–145)

## 2022-06-25 LAB — MAGNESIUM: Magnesium: 2.3 mg/dL (ref 1.7–2.4)

## 2022-06-25 LAB — CBC
HCT: 34.1 % — ABNORMAL LOW (ref 39.0–52.0)
Hemoglobin: 10.4 g/dL — ABNORMAL LOW (ref 13.0–17.0)
MCH: 23.4 pg — ABNORMAL LOW (ref 26.0–34.0)
MCHC: 30.5 g/dL (ref 30.0–36.0)
MCV: 76.6 fL — ABNORMAL LOW (ref 80.0–100.0)
Platelets: 102 10*3/uL — ABNORMAL LOW (ref 150–400)
RBC: 4.45 MIL/uL (ref 4.22–5.81)
RDW: 18.9 % — ABNORMAL HIGH (ref 11.5–15.5)
WBC: 4.8 10*3/uL (ref 4.0–10.5)
nRBC: 0 % (ref 0.0–0.2)

## 2022-06-25 LAB — PHOSPHORUS: Phosphorus: 3.1 mg/dL (ref 2.5–4.6)

## 2022-06-25 LAB — TACROLIMUS LEVEL: Tacrolimus (FK506) - LabCorp: 4.2 ng/mL (ref 2.0–20.0)

## 2022-06-25 LAB — HIV ANTIBODY (ROUTINE TESTING W REFLEX): HIV Screen 4th Generation wRfx: NONREACTIVE

## 2022-06-25 MED ORDER — POTASSIUM CHLORIDE CRYS ER 20 MEQ PO TBCR
40.0000 meq | EXTENDED_RELEASE_TABLET | Freq: Two times a day (BID) | ORAL | Status: AC
  Administered 2022-06-25 (×2): 40 meq via ORAL
  Filled 2022-06-25 (×2): qty 2

## 2022-06-25 NOTE — Progress Notes (Signed)
Advanced Heart Failure Rounding Note   Subjective:    Minimal response to IV lasix and metolazone yesterday.   Scr up to 3.1  Says breathing is much better today. No orthopnea or PND    Objective:   Weight Range:  Vital Signs:   Temp:  [97.7 F (36.5 C)-98.4 F (36.9 C)] 98.2 F (36.8 C) (04/02 0814) Pulse Rate:  [80-99] 83 (04/02 0814) Resp:  [16-24] 16 (04/02 0429) BP: (139-156)/(79-84) 139/79 (04/02 0814) SpO2:  [91 %-93 %] 92 % (04/02 0429) Last BM Date : 06/23/22  Weight change: Filed Weights   06/21/22 0840  Weight: 124.3 kg    Intake/Output:   Intake/Output Summary (Last 24 hours) at 06/25/2022 1122 Last data filed at 06/25/2022 0800 Gross per 24 hour  Intake 340 ml  Output 1575 ml  Net -1235 ml     Physical Exam: General: Sitting up in bed  No resp difficulty HEENT: normal Neck: supple. No obvious JVD . Carotids 2+ bilat; no bruits. No lymphadenopathy or thryomegaly appreciated. Cor: PMI nondisplaced. Regular rate & rhythm. No rubs, gallops or murmurs. Lungs: clear Abdomen: obese soft, nontender, nondistended. No hepatosplenomegaly. No bruits or masses. Good bowel sounds. Extremities: no cyanosis, clubbing, rash, chronic venous stasis cahnges Neuro: alert & orientedx3, cranial nerves grossly intact. moves all 4 extremities w/o difficulty. Affect pleasant  Telemetry: Sinus 80s Personally reviewed   Labs: Basic Metabolic Panel: Recent Labs  Lab 06/22/22 0230 06/22/22 0233 06/23/22 0500 06/24/22 0326 06/24/22 1839 06/25/22 0354  NA  --  139 139 139 134* 139  K  --  4.0 3.5 3.5 3.5 3.1*  CL  --  99 101 100 98 102  CO2  --  28 29 30 26 28   GLUCOSE  --  96 125* 156* 288* 105*  BUN  --  60* 63* 73* 85* 82*  CREATININE  --  2.20* 2.36* 2.61* 3.07* 3.16*  CALCIUM  --  8.8* 9.0 9.0 8.9 9.1  MG 2.0  --  2.3 2.2  --  2.3  PHOS 2.8  --  4.4 3.1  --  3.1    Liver Function Tests: Recent Labs  Lab 06/21/22 0830 06/22/22 0233  AST 25 27   ALT 14 14  ALKPHOS 114 91  BILITOT 1.3* 1.6*  PROT 7.1 6.5  ALBUMIN 3.4* 2.9*   No results for input(s): "LIPASE", "AMYLASE" in the last 168 hours. Recent Labs  Lab 06/21/22 1119  AMMONIA 19    CBC: Recent Labs  Lab 06/21/22 0830 06/22/22 0233 06/23/22 0500 06/24/22 0326 06/25/22 0354  WBC 10.9* 10.1 4.5 5.0 4.8  NEUTROABS 11.1*  --   --   --   --   HGB 12.3* 11.2* 10.5* 10.3* 10.4*  HCT 41.9 38.2* 35.7* 35.3* 34.1*  MCV 78.3* 79.1* 78.8* 78.8* 76.6*  PLT 142* 108* 91* 95* 102*    Cardiac Enzymes: Recent Labs  Lab 06/21/22 0830  CKTOTAL 38*    BNP: BNP (last 3 results) Recent Labs    05/09/22 1218 05/30/22 1149 06/21/22 0848  BNP 352.3* 430.9* 465.0*    ProBNP (last 3 results) No results for input(s): "PROBNP" in the last 8760 hours.    Other results:  Imaging: No results found.   Medications:     Scheduled Medications:  apixaban  5 mg Oral BID   buPROPion  150 mg Oral BID   insulin aspart  0-15 Units Subcutaneous Q4H   iron polysaccharides  150 mg Oral  Daily   montelukast  10 mg Oral QHS   mycophenolate  360 mg Oral BID   pantoprazole  40 mg Oral QHS   potassium chloride  40 mEq Oral BID   predniSONE  5 mg Oral Daily   rosuvastatin  10 mg Oral QHS   saccharomyces boulardii  250 mg Oral BID   sulfamethoxazole-trimethoprim  1 tablet Oral Q M,W,F   tacrolimus  2 mg Oral BID   tamsulosin  0.4 mg Oral QPC breakfast   torsemide  100 mg Oral Daily    Infusions:  ceFEPime (MAXIPIME) IV 2 g (06/25/22 0841)    PRN Medications: acetaminophen, alum & mag hydroxide-simeth, budesonide (PULMICORT) nebulizer solution, hydrALAZINE **OR** hydrALAZINE, hydrOXYzine, ipratropium-albuterol, ondansetron **OR** ondansetron (ZOFRAN) IV   Assessment/Plan:    1.  Pyelonephritis/serratia sepsis -On antibiotics as per primary team   2.  Acute on chronic systolic HF - Echo EF 40 to 45% - Volume status looks much better today. Scr up. Suspect  pre-renal - Hold diuretics for now.  - Encouraged po intake - Watch renal function closely  - Daily weights    3.  CAD/CABG -Eliquis, Crestor - No s/s angina  4.  Permanent A-fib - Rate controlled -Continue Eliquis -History of bradycardia with beta-blockers.   5. ESRD s/p renal transplant with subsequent AKI on CKD 3B-IV - Baseline Scr 2.5-2.7 - Scr up to 3.1 today - Suspect pre-renal. Yesterday I did have some concerns for low output HF/ATN but seems better today - Hold diuretics for now.  - Encouraged po intake - Watch renal function closely  - Renal following   Length of Stay: 4   Fatma Rutten MD 06/25/2022, 11:22 AM  Advanced Heart Failure Team Pager [phone] (M-F; 7a - 4p)  Please contact [facility] for night-coverage after hours (4p -7a ) and weekends on [email or web address]

## 2022-06-25 NOTE — Progress Notes (Signed)
[facility]  ROUNDING NOTE   Subjective:   [name] presents with shortness of breath, dyspnea on exertion, intermittent fevers and gross hematuria. Patient states he has been taking all his medications including apixaban and his immunomodulating agents: tacrolimus, mycophenolate and prednisone.    Patient ambulating in the room Appetite slowly improving Reports lower extremity edema Remains on room air  Creatinine 3.16 UOP 1483ml   Objective:  Vital signs in last 24 hours:  Temp:  [97.7 F (36.5 C)-98.4 F (36.9 C)] 98.2 F (36.8 C) (04/02 0814) Pulse Rate:  [80-99] 83 (04/02 0814) Resp:  [16-24] 16 (04/02 0429) BP: (139-156)/(79-84) 139/79 (04/02 0814) SpO2:  [91 %-93 %] 92 % (04/02 0429)  Weight change:  Filed Weights   06/21/22 0840  Weight: 124.3 kg    Intake/Output: I/O last 3 completed shifts: In: 92 [P.O.:960; IV Piggyback:200] Out: 2125 [Urine:2125]   Intake/Output this shift:  Total I/O In: -  Out: 400 [Urine:400]  Physical Exam: General: NAD  Head: Normocephalic, atraumatic. Moist oral mucosal membranes  Eyes: Anicteric  Lungs:  Clear to auscultation, room air  Heart: Irregular  Abdomen:  Obese, LLQ allograft kidney - no tenderness  Extremities:  no peripheral edema.  Neurologic: Nonfocal, moving all four extremities  Skin: No lesions       Basic Metabolic Panel: Recent Labs  Lab 06/22/22 0230 06/22/22 0233 06/23/22 0500 06/24/22 0326 06/24/22 1839 06/25/22 0354  NA  --  139 139 139 134* 139  K  --  4.0 3.5 3.5 3.5 3.1*  CL  --  99 101 100 98 102  CO2  --  28 29 30 26 28   GLUCOSE  --  96 125* 156* 288* 105*  BUN  --  60* 63* 73* 85* 82*  CREATININE  --  2.20* 2.36* 2.61* 3.07* 3.16*  CALCIUM  --  8.8* 9.0 9.0 8.9 9.1  MG 2.0  --  2.3 2.2  --  2.3  PHOS 2.8  --  4.4 3.1  --  3.1     Liver Function Tests: Recent Labs  Lab 06/21/22 0830 06/22/22 0233  AST 25 27  ALT 14 14  ALKPHOS 114 91   BILITOT 1.3* 1.6*  PROT 7.1 6.5  ALBUMIN 3.4* 2.9*    No results for input(s): "LIPASE", "AMYLASE" in the last 168 hours. Recent Labs  Lab 06/21/22 1119  AMMONIA 19     CBC: Recent Labs  Lab 06/21/22 0830 06/22/22 0233 06/23/22 0500 06/24/22 0326 06/25/22 0354  WBC 10.9* 10.1 4.5 5.0 4.8  NEUTROABS 11.1*  --   --   --   --   HGB 12.3* 11.2* 10.5* 10.3* 10.4*  HCT 41.9 38.2* 35.7* 35.3* 34.1*  MCV 78.3* 79.1* 78.8* 78.8* 76.6*  PLT 142* 108* 91* 95* 102*     Cardiac Enzymes: Recent Labs  Lab 06/21/22 0830  CKTOTAL 38*     BNP: Invalid input(s): "POCBNP"  CBG: Recent Labs  Lab 06/24/22 1951 06/24/22 2332 06/25/22 0427 06/25/22 0809 06/25/22 1146  GLUCAP 272* 155* 117* 151* 45*     Microbiology: Results for orders placed or performed during the hospital encounter of 06/21/22  Blood culture (routine x 2)     Status: Abnormal   Collection Time: 06/21/22  8:38 AM   Specimen: BLOOD  Result Value Ref Range Status   Specimen Description   Final    BLOOD RIGHT ARM Performed at [facility], [address],  [place], [place] [address]    Special Requests   Final    NONE Performed at [facility], [address] [place], [place] [address]    Culture  Setup Time   Final    Organism ID to follow GRAM NEGATIVE RODS AEROBIC BOTTLE ONLY CRITICAL RESULT CALLED TO, READ BACK BY AND VERIFIED WITH: RAQUEL RODRIGUEZ GUZMAN AT [id number] 06/22/22.PMF  REVIEWED BY A. LAFRANCE Performed at [facility], [place] [address], [place], [place] [address]    Culture SERRATIA MARCESCENS (A)  Final   Report Status 06/24/2022 FINAL  Final   Organism ID, Bacteria SERRATIA MARCESCENS  Final      Susceptibility   Serratia marcescens - MIC*    CEFEPIME <=0.12 SENSITIVE Sensitive     CEFTAZIDIME <=1 SENSITIVE Sensitive     CEFTRIAXONE <=0.25 SENSITIVE Sensitive     CIPROFLOXACIN 0.5 INTERMEDIATE Intermediate     GENTAMICIN <=1 SENSITIVE Sensitive      TRIMETH/SULFA >=320 RESISTANT Resistant     * SERRATIA MARCESCENS  Blood Culture ID Panel (Reflexed)     Status: Abnormal   Collection Time: 06/21/22  8:38 AM  Result Value Ref Range Status   Enterococcus faecalis NOT DETECTED NOT DETECTED Final   Enterococcus Faecium NOT DETECTED NOT DETECTED Final   Listeria monocytogenes NOT DETECTED NOT DETECTED Final   Staphylococcus species NOT DETECTED NOT DETECTED Final   Staphylococcus aureus (BCID) NOT DETECTED NOT DETECTED Final   Staphylococcus epidermidis NOT DETECTED NOT DETECTED Final   Staphylococcus lugdunensis NOT DETECTED NOT DETECTED Final   Streptococcus species NOT DETECTED NOT DETECTED Final   Streptococcus agalactiae NOT DETECTED NOT DETECTED Final   Streptococcus pneumoniae NOT DETECTED NOT DETECTED Final   Streptococcus pyogenes NOT DETECTED NOT DETECTED Final   A.calcoaceticus-baumannii NOT DETECTED NOT DETECTED Final   Bacteroides fragilis NOT DETECTED NOT DETECTED Final   Enterobacterales DETECTED (A) NOT DETECTED Final    Comment: Enterobacterales represent a large order of gram negative bacteria, not a single organism. CRITICAL RESULT CALLED TO, READ BACK BY AND VERIFIED WITH: RAQUEL RODRIGUEZ GUZMAN AT [id number] 06/22/22.PMF    Enterobacter cloacae complex NOT DETECTED NOT DETECTED Final   Escherichia coli NOT DETECTED NOT DETECTED Final   Klebsiella aerogenes NOT DETECTED NOT DETECTED Final   Klebsiella oxytoca NOT DETECTED NOT DETECTED Final   Klebsiella pneumoniae NOT DETECTED NOT DETECTED Final   Proteus species NOT DETECTED NOT DETECTED Final   Salmonella species NOT DETECTED NOT DETECTED Final   Serratia marcescens DETECTED (A) NOT DETECTED Final    Comment: CRITICAL RESULT CALLED TO, READ BACK BY AND VERIFIED WITH: RAQUEL RODRIGUEZ GUZMAN AT [id number] 06/22/22.PMF    Haemophilus influenzae NOT DETECTED NOT DETECTED Final   Neisseria meningitidis NOT DETECTED NOT DETECTED Final   Pseudomonas aeruginosa NOT DETECTED NOT  DETECTED Final   Stenotrophomonas maltophilia NOT DETECTED NOT DETECTED Final   Candida albicans NOT DETECTED NOT DETECTED Final   Candida auris NOT DETECTED NOT DETECTED Final   Candida glabrata NOT DETECTED NOT DETECTED Final   Candida krusei NOT DETECTED NOT DETECTED Final   Candida parapsilosis NOT DETECTED NOT DETECTED Final   Candida tropicalis NOT DETECTED NOT DETECTED Final   Cryptococcus neoformans/gattii NOT DETECTED NOT DETECTED Final   CTX-M ESBL NOT DETECTED NOT DETECTED Final   Carbapenem resistance IMP NOT DETECTED NOT DETECTED Final   Carbapenem resistance KPC NOT DETECTED NOT DETECTED Final   Carbapenem resistance NDM NOT DETECTED NOT DETECTED Final   Carbapenem resist OXA  [place] NOT DETECTED NOT DETECTED Final   Carbapenem resistance VIM NOT DETECTED NOT DETECTED Final    Comment: Performed at [name], [address] [place], [place] [address]  Urine Culture     Status: Abnormal   Collection Time: 06/21/22  8:48 AM   Specimen: Urine, Clean Catch  Result Value Ref Range Status   Specimen Description   Final    URINE, CLEAN CATCH Performed at [facility], [address] [place], [place] [address]    Special Requests   Final    NONE Performed at [facility], [address] [place], [place] [address]    Culture >=100,000 COLONIES/mL SERRATIA MARCESCENS (A)  Final   Report Status 06/23/2022 FINAL  Final   Organism ID, Bacteria SERRATIA MARCESCENS (A)  Final      Susceptibility   Serratia marcescens - MIC*    CEFEPIME <=0.12 SENSITIVE Sensitive     CEFTRIAXONE <=0.25 SENSITIVE Sensitive     CIPROFLOXACIN 0.5 INTERMEDIATE Intermediate     GENTAMICIN <=1 SENSITIVE Sensitive     NITROFURANTOIN 256 RESISTANT Resistant     TRIMETH/SULFA >=320 RESISTANT Resistant     * >=100,000 COLONIES/mL SERRATIA MARCESCENS  Resp panel by RT-PCR (RSV, Flu A&B, Covid) Anterior Nasal Swab     Status: None   Collection Time: 06/21/22  8:48 AM    Specimen: Anterior Nasal Swab  Result Value Ref Range Status   SARS Coronavirus 2 by RT PCR NEGATIVE NEGATIVE Final    Comment: (NOTE) SARS-CoV-2 target nucleic acids are NOT DETECTED.  The SARS-CoV-2 RNA is generally detectable in upper respiratory specimens during the acute phase of infection. The lowest concentration of SARS-CoV-2 viral copies this assay can detect is 138 copies/mL. A negative result does not preclude SARS-Cov-2 infection and should not be used as the sole basis for treatment or other patient management decisions. A negative result may occur with  improper specimen collection/handling, submission of specimen other than nasopharyngeal swab, presence of viral mutation(s) within the areas targeted by this assay, and inadequate number of viral copies(<138 copies/mL). A negative result must be combined with clinical observations, patient history, and epidemiological information. The expected result is Negative.  Fact Sheet for Patients:  EntrepreneurPulse.com.au  Fact Sheet for Healthcare Providers:  IncredibleEmployment.be  This test is no t yet approved or cleared by the [place] FDA and  has been authorized for detection and/or diagnosis of SARS-CoV-2 by FDA under an Emergency Use Authorization (EUA). This EUA will remain  in effect (meaning this test can be used) for the duration of the COVID-19 declaration under Section 564(b)(1) of the Act, 21 U.S.C.section 360bbb-3(b)(1), unless the authorization is terminated  or revoked sooner.       Influenza A by PCR NEGATIVE NEGATIVE Final   Influenza B by PCR NEGATIVE NEGATIVE Final    Comment: (NOTE) The Xpert Xpress SARS-CoV-2/FLU/RSV plus assay is intended as an aid in the diagnosis of influenza from Nasopharyngeal swab specimens and should not be used as a sole basis for treatment. Nasal washings and aspirates are unacceptable for Xpert Xpress  SARS-CoV-2/FLU/RSV testing.  Fact Sheet for Patients: EntrepreneurPulse.com.au  Fact Sheet for Healthcare Providers: IncredibleEmployment.be  This test is not yet approved or cleared by the [place] FDA and has been authorized for detection and/or diagnosis of SARS-CoV-2 by FDA under an Emergency Use Authorization (EUA). This EUA will remain in effect (meaning this test can be used) for the duration of the COVID-19 declaration  under Section 564(b)(1) of the Act, 21 U.S.C. section 360bbb-3(b)(1), unless the authorization is terminated or revoked.     Resp Syncytial Virus by PCR NEGATIVE NEGATIVE Final    Comment: (NOTE) Fact Sheet for Patients: EntrepreneurPulse.com.au  Fact Sheet for Healthcare Providers: IncredibleEmployment.be  This test is not yet approved or cleared by the [place] FDA and has been authorized for detection and/or diagnosis of SARS-CoV-2 by FDA under an Emergency Use Authorization (EUA). This EUA will remain in effect (meaning this test can be used) for the duration of the COVID-19 declaration under Section 564(b)(1) of the Act, 21 U.S.C. section 360bbb-3(b)(1), unless the authorization is terminated or revoked.  Performed at [facility], [address] [place], [place] [address]   Culture, blood (x 2)     Status: None (Preliminary result)   Collection Time: 06/21/22  1:20 PM   Specimen: BLOOD  Result Value Ref Range Status   Specimen Description BLOOD BLOOD RIGHT ARM  Final   Special Requests   Final    BOTTLES DRAWN AEROBIC AND ANAEROBIC Blood Culture adequate volume   Culture   Final    NO GROWTH 4 DAYS Performed at [facility], [address] [place], [place] [address]    Report Status PENDING  Incomplete  Culture, blood (x 2)     Status: None (Preliminary result)   Collection Time: 06/21/22  1:20 PM   Specimen: BLOOD  Result Value Ref  Range Status   Specimen Description BLOOD BLOOD RIGHT HAND  Final   Special Requests   Final    BOTTLES DRAWN AEROBIC AND ANAEROBIC Blood Culture results may not be optimal due to an excessive volume of blood received in culture bottles   Culture   Final    NO GROWTH 4 DAYS Performed at [facility], [address] [place], [place] [address]    Report Status PENDING  Incomplete  MRSA Next Gen by PCR, Nasal     Status: None   Collection Time: 06/21/22  1:57 PM   Specimen: Nasal Mucosa; Nasal Swab  Result Value Ref Range Status   MRSA by PCR Next Gen NOT DETECTED NOT DETECTED Final    Comment: (NOTE) The GeneXpert MRSA Assay (FDA approved for NASAL specimens only), is one component of a comprehensive MRSA colonization surveillance program. It is not intended to diagnose MRSA infection nor to guide or monitor treatment for MRSA infections. Test performance is not FDA approved in patients less than 15 years old. Performed at [facility], [address] [place], [place] [address]   Culture, blood (Routine X 2) w Reflex to ID Panel     Status: None (Preliminary result)   Collection Time: 06/23/22  5:00 AM   Specimen: BLOOD  Result Value Ref Range Status   Specimen Description BLOOD BLOOD RIGHT ARM  Final   Special Requests   Final    BOTTLES DRAWN AEROBIC AND ANAEROBIC Blood Culture results may not be optimal due to an excessive volume of blood received in culture bottles   Culture   Final    NO GROWTH 2 DAYS Performed at [facility], [address], [place], [place] [address]    Report Status PENDING  Incomplete  Culture, blood (Routine X 2) w Reflex to ID Panel     Status: None (Preliminary result)   Collection Time: 06/23/22  5:14 AM   Specimen: BLOOD  Result Value Ref Range Status   Specimen Description BLOOD BLOOD RIGHT  HAND  Final   Special Requests   Final    BOTTLES DRAWN AEROBIC AND ANAEROBIC Blood Culture results may not be optimal due to  an excessive volume of blood received in culture bottles   Culture   Final    NO GROWTH 2 DAYS Performed at [facility], [name], [place], [place] [address]    Report Status PENDING  Incomplete   *Note: Due to a large number of results and/or encounters for the requested time period, some results have not been displayed. A complete set of results can be found in Results Review.    Coagulation Studies: No results for input(s): "LABPROT", "INR" in the last 72 hours.   Urinalysis: No results for input(s): "COLORURINE", "LABSPEC", "PHURINE", "GLUCOSEU", "HGBUR", "BILIRUBINUR", "KETONESUR", "PROTEINUR", "UROBILINOGEN", "NITRITE", "LEUKOCYTESUR" in the last 72 hours.  Invalid input(s): "APPERANCEUR"     Imaging: No results found.   Medications:    ceFEPime (MAXIPIME) IV 2 g (06/25/22 0841)    apixaban  5 mg Oral BID   buPROPion  150 mg Oral BID   insulin aspart  0-15 Units Subcutaneous Q4H   iron polysaccharides  150 mg Oral Daily   montelukast  10 mg Oral QHS   mycophenolate  360 mg Oral BID   pantoprazole  40 mg Oral QHS   potassium chloride  40 mEq Oral BID   predniSONE  5 mg Oral Daily   rosuvastatin  10 mg Oral QHS   saccharomyces boulardii  250 mg Oral BID   sulfamethoxazole-trimethoprim  1 tablet Oral Q M,W,F   tacrolimus  2 mg Oral BID   tamsulosin  0.4 mg Oral QPC breakfast   acetaminophen, alum & mag hydroxide-simeth, budesonide (PULMICORT) nebulizer solution, hydrALAZINE **OR** hydrALAZINE, hydrOXYzine, ipratropium-albuterol, ondansetron **OR** ondansetron (ZOFRAN) IV  Assessment/ Plan:  Mr. Geremiah Smalls. is a 62 y.o. Hispanic male renal transplant in 2012, coronary artery disease status post CABG, atrial fibrillation, congestive heart failure, gout, GERD, hyperlipidemia, pulmonary hypertension, status post gastric bypass, diabetes mellitus type II, who was admitted to [facility] on 06/21/2022 for Pyelonephritis [N12] Acute respiratory failure  with hypoxia [J96.01] Sepsis [A41.9] Sepsis, due to unspecified organism, unspecified whether acute organ dysfunction present [A41.9]    Acute kidney injury on chronic kidney disease stage IIIA-T: baseline creatinine of 1.63, GFR of 47 on 05/30/22. Followed by [facility]. Transplant doppler reviewed with patient.  - Pending tacrolimus trough level. Continue tacrolimus 2g PO bid.  - continue mycophenolate and prednisone - Continue PJP prophylaxis with Bactrim. - Renal function appears to be plateauing.  - may consider diuretic tomorrow if edema persist    Pyelonephritis/urinary tract infection/sepsis: cultures growing serratia marcences in urine and blood from 3/29. Pending sensitivities.  - empiric antibiotics: cefepime and Bactrim   Hypotension: with septic shock. Not requiring vasopressors. Holding home blood pressure regimen: hydralazine, isosorbide mononitrate, metolazone, tamsulosin and torsemide. - holding all agents.  -Received Furosemide and Metolazone on 06/24/22   Diabetes mellitus type II with chronic kidney disease: insulin dependent. Hemoglobin A1c of 7.7% on 05/15/22. Holding dapagliflozin.    LOS: [place] 4/2/202412:57 PM

## 2022-06-25 NOTE — Progress Notes (Signed)
Date of Admission:  06/21/2022      ID: [name]. is a 62 y.o. male  Principal Problem:   Pyelonephritis Active Problems:   CAD (coronary artery disease)   Asthma, persistent controlled   Renal transplant recipient   Essential hypertension   Chronic combined systolic and diastolic CHF (congestive heart failure)   Pulmonary hypertension due to left heart disease   GERD (gastroesophageal reflux disease)   Type II diabetes mellitus with renal manifestations   Sepsis   Acute respiratory failure with hypoxia   Ischemic cardiomyopathy   Permanent atrial fibrillation   Bacteremia due to Gram-negative bacteria   Serratia infection   Complicated UTI (urinary tract infection)    Subjective: Feeling better Says urine is getting clearer  Medications:   apixaban  5 mg Oral BID   buPROPion  150 mg Oral BID   insulin aspart  0-15 Units Subcutaneous Q4H   iron polysaccharides  150 mg Oral Daily   montelukast  10 mg Oral QHS   mycophenolate  360 mg Oral BID   pantoprazole  40 mg Oral QHS   potassium chloride  40 mEq Oral BID   predniSONE  5 mg Oral Daily   rosuvastatin  10 mg Oral QHS   saccharomyces boulardii  250 mg Oral BID   sulfamethoxazole-trimethoprim  1 tablet Oral Q M,W,F   tacrolimus  2 mg Oral BID   tamsulosin  0.4 mg Oral QPC breakfast    Objective: Vital signs in last 24 hours: Patient Vitals for the past 24 hrs:  BP Temp Temp src Pulse Resp SpO2  06/25/22 0814 139/79 98.2 F (36.8 C) Oral 83 -- --  06/25/22 0429 (!) 156/82 98 F (36.7 C) -- 80 16 92 %  06/24/22 1950 (!) 150/80 97.7 F (36.5 C) Oral 90 16 93 %  06/24/22 1503 (!) 149/84 98.4 F (36.9 C) -- 99 (!) 24 91 %      PHYSICAL EXAM:  General: Alert, cooperative, no distress, appears stated age.  Lungs: Clear to auscultation bilaterally. No Wheezing or Rhonchi. No rales. Heart: Regular rate and rhythm, no murmur, rub or gallop. Abdomen: Soft, non-tender,not distended. Bowel sounds  normal. No masses Extremities:venous pigmentation with stasis Skin: No rashes or lesions. Or bruising Lymph: Cervical, supraclavicular normal. Neurologic: Grossly non-focal  Lab Results    Latest Ref Rng & Units 06/25/2022    3:54 AM 06/24/2022    3:26 AM 06/23/2022    5:00 AM  CBC  WBC 4.0 - 10.5 K/uL 4.8  5.0  4.5   Hemoglobin 13.0 - 17.0 g/dL 10.4  10.3  10.5   Hematocrit 39.0 - 52.0 % 34.1  35.3  35.7   Platelets 150 - 400 K/uL 102  95  91        Latest Ref Rng & Units 06/25/2022    3:54 AM 06/24/2022    6:39 PM 06/24/2022    3:26 AM  CMP  Glucose 70 - 99 mg/dL 105  288  156   BUN 8 - 23 mg/dL 82  85  73   Creatinine 0.61 - 1.24 mg/dL 3.16  3.07  2.61   Sodium 135 - 145 mmol/L 139  134  139   Potassium 3.5 - 5.1 mmol/L 3.1  3.5  3.5   Chloride 98 - 111 mmol/L 102  98  100   CO2 22 - 32 mmol/L 28  26  30    Calcium 8.9 - 10.3 mg/dL 9.1  8.9  9.0       Microbiology:  Studies/Results: No results found.   Assessment/Plan: ?serratia bacteremia Serratia UTI Complicated Uti with transplant kidney lower pole fullness pyelonephritis Also BPH with urolift with 6 implants in the prostate  post void bladder scan only 34 ml- normal  will need IV cefepime  or IV ertapenem ( may be ceftriaxone) for 2-4  weeks. Pt would like to go one once a day antibiotic Will discuss with urology regarding risk of prostate implants getting infected   ESRD- DDRT in 2012 on tacrolimus/mycophenolate   Anemia   Afib   CHF   Pulmonary HTN   Gout   DM management as per primary team   HTN   CAD s/p CABG H/o gastric sleeve surgery  Discussed the management with patient

## 2022-06-25 NOTE — Progress Notes (Signed)
[facility] Progress Note  Patient: [name].    KG:[id number]  DOA: 06/21/2022     Date of Service: the patient was seen and examined on 06/25/2022  Chief Complaint  Patient presents with   Shortness of Breath   Fever   Hematuria   Brief hospital course: [name]. is a 62 y.o. male with PMH of CAD s/p CABG in 2005, ischemic cardiomyopathy LVEF 40 to 45% as per echo Jan 2024, pulmonary hypertension, chronic A. Fib on Eliquis, aortic stenosis, HTN, type 2 DM, ESRD s/p renal transplant in 2012 now with stage III CKD, OSA on CPAP, gout presenting with sepsis, pyelonephritis.  ED w/up: Tmax 101.5, HR 100s, BP 80s to 100s over 50s, on 2 L nasal cannula to keep O2 sats greater than 92%.  Wbx 10.9, Plts 142, LA 2.1---1.8, roponin 90s to 120s, COVID flu and RSV negative. BNP 465. Creatinine 2.2.  CT head grossly stable. CT ch/abd/p: hydronephrosis of transplanted kidney. Noted 2.9 x 1 cm nodular Consolidative opacity in the right lower lobe present since 2021. Changes in liver consistent with cirrhosis splenomegaly with some concern for portal vein hypertension.   Assessment and Plan:  Sepsis, Serratia bacteremia 2/2 pyelonephritis and possible atypical pneumonia Sepsis criteria Tmax 101.5, heart rate 100, hypotensive, WBC elevated, pyelonephritis, lactic acid 2.1, hypoxic respiratory failure S/p IV fluid, DC'd due to pulmonary edema and respiratory failure   Pyelonephritis Baseline history of end-stage renal disease status post renal transplant on chronic immunosuppression  Pending tacrolimus trough level. Continue tacrolimus 2g PO bid.  Continue mycophenolate and prednisone Continue PJP prophylaxis with Bactrim S/p vancomycin and Flagyl Continue cefepime 2 g every 12 hourly Urine cx: Serratia marcescens  Blood culture: Serratia marcescens, sensitive to cefepime Repeat blood cultures NGTD  Consulted ID for antibiotics and duration   AKI ESRD s/p renal  transplant Monitor renal functions and urine output Baseline creatinine 1.63 Creatinine 3.16 most likely due to excessive diuresis Nephrology following   Acute hypoxic respiratory failure, multifactorial could be due to pneumonia versus CHF Started empiric azithromycin 500 mg IV daily x 5 days S/p Lasix 60 mg x 1 dose, started torsemide 100 mg p.o. twice daily Continue diuresis as per Nephrology and Cardiology Continue supplemental O2 inhalation and gradually wean off   CAD (coronary artery disease) S/p CABG in 2005 No active chest pain though with troponins in the 90s on presentation Suspect demand ischemia in the setting of sepsis and pyelonephritis A-fib with RVR on EKG Continue Eliquis Continue telemetry monitor Trop 97--126  consulted cardiology    Chronic systolic congestive heart failure  2D echo January 2024 with LVEF of 40-45%  January 2023 admission does mention episode of cardiorenal syndrome assd w/ diuresis  Noted to be followed by the heart failure clinic Dry weight at hospital dc 03/2022 was 116kg  BNP 465 elevated  Continue torsemide 100 mg p.o. twice daily 4/1 Lasix 80 mg x 1 dose and metolazone 5 mg oral x 1 dose given  Cardiology following  Hypokalemia due to diuresis, Potassium repleted cautiously due to renal failure Monitor electrolytes and replete as needed.   Essential hypertension BP 100s/50s in setting of sepsis, so BP meds were held Blood pressure gradually improved Use Hydralazine prn   H/o asthma, patient has shortness of breath most likely due to pneumonia and CHF Continue management as above Started DuoNeb every 6 hourly as needed   Type II diabetes mellitus with renal manifestations SSI  A1C  GERD (gastroesophageal reflux disease) PPI   Pulmonary hypertension due to left heart disease Primarily venous hypertension by [place] 03/2021 admission,  On imdur which has been held due to low blood pressure Cardiology  following   Body mass index is 39.31 kg/m.  Interventions:       Diet: Heart healthy/carb modified diet DVT Prophylaxis: Therapeutic Anticoagulation with Eliquis    Advance goals of care discussion: Full code  Family Communication: family was present at bedside, at the time of interview.  The pt provided permission to discuss medical plan with the family. Opportunity was given to ask question and all questions were answered satisfactorily.   Disposition:  Pt is from Home, admitted with sepsis, pyelonephritis, still on IV antibiotics, need to repeat blood cultures, which precludes a safe discharge. Discharge to home, when clinically stable.  May need few days to improve.  Subjective: No significant events overnight, today patient was feeling better, sitting comfortably on the recliner.  Shortness of breath resolved, no abdominal pain.   Physical Exam: General: NAD, lying comfortably, mild respiratory distress Eyes: PERRLA ENT: Oral Mucosa Clear, moist  Neck: no JVD,  Cardiovascular: S1 and S2 Present, no Murmur,  Respiratory: Good air entry bilaterally, increased respiratory efforts, bilateral crackles, no wheezing appreciated Abdomen: Bowel Sound present, Soft and no tenderness,  Skin: no rashes Extremities: 2+ Pedal edema, no calf tenderness, chronic venous stasis pigmentation Neurologic: without any new focal findings Gait not checked due to patient safety concerns  Vitals:   06/24/22 1950 06/25/22 0429 06/25/22 0814 06/25/22 1200  BP: (!) 150/80 (!) 156/82 139/79 (!) 147/102  Pulse: 90 80 83 96  Resp: 16 16  18   Temp: 97.7 F (36.5 C) 98 F (36.7 C) 98.2 F (36.8 C) 97.7 F (36.5 C)  TempSrc: Oral  Oral Oral  SpO2: 93% 92%  99%  Weight:      Height:        Intake/Output Summary (Last 24 hours) at 06/25/2022 1506 Last data filed at 06/25/2022 1433 Gross per 24 hour  Intake 220 ml  Output 1575 ml  Net -1355 ml   Filed Weights   06/21/22 0840  Weight: 124.3  kg    Data Reviewed: I have personally reviewed and interpreted daily labs, tele strips, imagings as discussed above. I reviewed all nursing notes, pharmacy notes, vitals, pertinent old records I have discussed plan of care as described above with RN and patient/family.  CBC: Recent Labs  Lab 06/21/22 0830 06/22/22 0233 06/23/22 0500 06/24/22 0326 06/25/22 0354  WBC 10.9* 10.1 4.5 5.0 4.8  NEUTROABS 11.1*  --   --   --   --   HGB 12.3* 11.2* 10.5* 10.3* 10.4*  HCT 41.9 38.2* 35.7* 35.3* 34.1*  MCV 78.3* 79.1* 78.8* 78.8* 76.6*  PLT 142* 108* 91* 95* [id number]*   Basic Metabolic Panel: Recent Labs  Lab 06/22/22 0230 06/22/22 0233 06/23/22 0500 06/24/22 0326 06/24/22 1839 06/25/22 0354  NA  --  139 139 139 134* 139  K  --  4.0 3.5 3.5 3.5 3.1*  CL  --  99 101 100 98 102  CO2  --  28 29 30 26 28   GLUCOSE  --  96 125* 156* 288* 105*  BUN  --  60* 63* 73* 85* 82*  CREATININE  --  2.20* 2.36* 2.61* 3.07* 3.16*  CALCIUM  --  8.8* 9.0 9.0 8.9 9.1  MG 2.0  --  2.3 2.2  --  2.3  PHOS 2.8  --  4.4 3.1  --  3.1    Studies: No results found.  Scheduled Meds:  apixaban  5 mg Oral BID   buPROPion  150 mg Oral BID   insulin aspart  0-15 Units Subcutaneous Q4H   iron polysaccharides  150 mg Oral Daily   montelukast  10 mg Oral QHS   mycophenolate  360 mg Oral BID   pantoprazole  40 mg Oral QHS   potassium chloride  40 mEq Oral BID   predniSONE  5 mg Oral Daily   rosuvastatin  10 mg Oral QHS   saccharomyces boulardii  250 mg Oral BID   sulfamethoxazole-trimethoprim  1 tablet Oral Q M,W,F   tacrolimus  2 mg Oral BID   tamsulosin  0.4 mg Oral QPC breakfast   Continuous Infusions:  ceFEPime (MAXIPIME) IV 2 g (06/25/22 0841)   PRN Meds: acetaminophen, alum & mag hydroxide-simeth, budesonide (PULMICORT) nebulizer solution, hydrALAZINE **OR** hydrALAZINE, hydrOXYzine, ipratropium-albuterol, ondansetron **OR** ondansetron (ZOFRAN) IV  Time spent: 35 minutes  Author: Val Riles. MD [facility] 06/25/2022 3:06 PM  To reach On-call, see care teams to locate the attending and reach out to them via [email or web address]. If 7PM-7AM, please contact night-coverage If you still have difficulty reaching the attending provider, please page the [facility] (Director on Call) for [facility] on amion for assistance.

## 2022-06-25 NOTE — Inpatient Diabetes Management (Signed)
Inpatient Diabetes Program Recommendations  AACE/ADA: New Consensus Statement on Inpatient Glycemic Control (2015)  Target Ranges:  Prepandial:   less than 140 mg/dL      Peak postprandial:   less than 180 mg/dL (1-2 hours)      Critically ill patients:  140 - 180 mg/dL   Lab Results  Component Value Date   GLUCAP 151 (H) 06/25/2022   HGBA1C 8.2 (H) 06/21/2022    Review of Glycemic Control  Latest Reference Range & Units 06/24/22 07:38 06/24/22 11:21 06/24/22 16:18 06/24/22 19:51 06/24/22 23:32 06/25/22 04:27 06/25/22 08:09  Glucose-Capillary 70 - 99 mg/dL 185 (H) 210 (H) 227 (H) 272 (H) 155 (H) 117 (H) 151 (H)  (H): Data is abnormally high  Diabetes history: DM2 Outpatient Diabetes medications: Semglee 40 units QHS, Humalog 10-20 units sliding scale, Farxiga 10 mg QD Current orders for Inpatient glycemic control: Novolog 0-15 units Q4H, Prednisone 5 mg QD   Inpatient Diabetes Program Recommendations:     Postprandial glucose elevated.  Might consider:   Novolog 0-15 units TID and 0-5 QHS if eating Novolog 2-3 units TID with meals  Will continue to follow while inpatient.  Thank you, Carl Dixon, MSN, [facility] Coordinator Inpatient Diabetes Program [phone] (team pager from 8a-5p)

## 2022-06-26 DIAGNOSIS — R7881 Bacteremia: Secondary | ICD-10-CM | POA: Diagnosis not present

## 2022-06-26 DIAGNOSIS — N10 Acute tubulo-interstitial nephritis: Secondary | ICD-10-CM

## 2022-06-26 DIAGNOSIS — N12 Tubulo-interstitial nephritis, not specified as acute or chronic: Secondary | ICD-10-CM | POA: Diagnosis not present

## 2022-06-26 DIAGNOSIS — Z940 Kidney transplant status: Secondary | ICD-10-CM | POA: Diagnosis not present

## 2022-06-26 DIAGNOSIS — N390 Urinary tract infection, site not specified: Secondary | ICD-10-CM | POA: Diagnosis not present

## 2022-06-26 LAB — CBC
HCT: 34.4 % — ABNORMAL LOW (ref 39.0–52.0)
Hemoglobin: 10.3 g/dL — ABNORMAL LOW (ref 13.0–17.0)
MCH: 22.9 pg — ABNORMAL LOW (ref 26.0–34.0)
MCHC: 29.9 g/dL — ABNORMAL LOW (ref 30.0–36.0)
MCV: 76.6 fL — ABNORMAL LOW (ref 80.0–100.0)
Platelets: 105 10*3/uL — ABNORMAL LOW (ref 150–400)
RBC: 4.49 MIL/uL (ref 4.22–5.81)
RDW: 18.7 % — ABNORMAL HIGH (ref 11.5–15.5)
WBC: 3.6 10*3/uL — ABNORMAL LOW (ref 4.0–10.5)
nRBC: 0 % (ref 0.0–0.2)

## 2022-06-26 LAB — GLUCOSE, CAPILLARY
Glucose-Capillary: 101 mg/dL — ABNORMAL HIGH (ref 70–99)
Glucose-Capillary: 81 mg/dL (ref 70–99)
Glucose-Capillary: 188 mg/dL — ABNORMAL HIGH (ref 70–99)
Glucose-Capillary: 189 mg/dL — ABNORMAL HIGH (ref 70–99)
Glucose-Capillary: 261 mg/dL — ABNORMAL HIGH (ref 70–99)
Glucose-Capillary: 338 mg/dL — ABNORMAL HIGH (ref 70–99)
Glucose-Capillary: 223 mg/dL — ABNORMAL HIGH (ref 70–99)

## 2022-06-26 LAB — CULTURE, BLOOD (ROUTINE X 2)
Culture: NO GROWTH
Culture: NO GROWTH
Special Requests: ADEQUATE

## 2022-06-26 LAB — BASIC METABOLIC PANEL
Anion gap: 10 (ref 5–15)
BUN: 96 mg/dL — ABNORMAL HIGH (ref 8–23)
CO2: 27 mmol/L (ref 22–32)
Calcium: 8.9 mg/dL (ref 8.9–10.3)
Chloride: 100 mmol/L (ref 98–111)
Creatinine, Ser: 3.84 mg/dL — ABNORMAL HIGH (ref 0.61–1.24)
GFR, Estimated: 17 mL/min — ABNORMAL LOW (ref >60)
Glucose, Bld: 139 mg/dL — ABNORMAL HIGH (ref 70–99)
Potassium: 3.7 mmol/L (ref 3.5–5.1)
Sodium: 137 mmol/L (ref 135–145)

## 2022-06-26 MED ORDER — SODIUM CHLORIDE 0.9 % IV SOLN
2.0000 g | INTRAVENOUS | Status: DC
  Administered 2022-06-26 – 2022-06-27 (×2): 2 g via INTRAVENOUS
  Filled 2022-06-26: qty 2
  Filled 2022-06-26: qty 12.5

## 2022-06-26 MED ORDER — METHOCARBAMOL 500 MG PO TABS
500.0000 mg | ORAL_TABLET | Freq: Four times a day (QID) | ORAL | Status: DC | PRN
  Administered 2022-06-27: 500 mg via ORAL
  Filled 2022-06-26: qty 1

## 2022-06-26 NOTE — Progress Notes (Signed)
Informed by CN that pt asked for his blood sugar to be tested. On assessment, pt stated that he was feeling sweaty and clammy and that his continuous glucose monitor was alarming. Same read 72.   Fingerstick results read 81. Pt requested milk and graham crackers. Same given. Blood sugar to be rechecked at scheduled time.

## 2022-06-26 NOTE — Progress Notes (Signed)
Date of Admission:  06/21/2022      ID: [name]. is a 62 y.o. male  Principal Problem:   Pyelonephritis Active Problems:   CAD (coronary artery disease)   Asthma, persistent controlled   Renal transplant recipient   Essential hypertension   Chronic combined systolic and diastolic CHF (congestive heart failure)   Pulmonary hypertension due to left heart disease   GERD (gastroesophageal reflux disease)   Type II diabetes mellitus with renal manifestations   Sepsis   Acute respiratory failure with hypoxia   Ischemic cardiomyopathy   Permanent atrial fibrillation   Bacteremia due to Gram-negative bacteria   Serratia infection   Complicated UTI (urinary tract infection)    Subjective: Feeling better Says urine is getting clearer  Medications:   apixaban  5 mg Oral BID   buPROPion  150 mg Oral BID   insulin aspart  0-15 Units Subcutaneous Q4H   iron polysaccharides  150 mg Oral Daily   montelukast  10 mg Oral QHS   mycophenolate  360 mg Oral BID   pantoprazole  40 mg Oral QHS   predniSONE  5 mg Oral Daily   rosuvastatin  10 mg Oral QHS   saccharomyces boulardii  250 mg Oral BID   tacrolimus  2 mg Oral BID   tamsulosin  0.4 mg Oral QPC breakfast    Objective: Vital signs in last 24 hours: Patient Vitals for the past 24 hrs:  BP Temp Temp src Pulse Resp SpO2 Weight  06/26/22 0759 (!) 144/85 97.8 F (36.6 C) Oral 76 18 98 % --  06/26/22 0522 (!) 151/85 97.8 F (36.6 C) Oral 77 20 91 % --  06/26/22 0500 -- -- -- -- -- -- 126 kg  06/25/22 2016 (!) 144/91 (!) 97.5 F (36.4 C) Oral 80 20 92 % --  06/25/22 1200 (!) 147/102 97.7 F (36.5 C) Oral 96 18 99 % --      PHYSICAL EXAM:  General: Alert, cooperative, no distress, appears stated age.  Lungs: Clear to auscultation bilaterally. No Wheezing or Rhonchi. No rales. Heart: Regular rate and rhythm, no murmur, rub or gallop. Abdomen: Soft, non-tender,not distended. Bowel sounds normal. No  masses Extremities:venous pigmentation with stasis Skin: No rashes or lesions. Or bruising Lymph: Cervical, supraclavicular normal. Neurologic: Grossly non-focal  Lab Results    Latest Ref Rng & Units 06/26/2022    3:56 AM 06/25/2022    3:54 AM 06/24/2022    3:26 AM  CBC  WBC 4.0 - 10.5 K/uL 3.6  4.8  5.0   Hemoglobin 13.0 - 17.0 g/dL 10.3  10.4  10.3   Hematocrit 39.0 - 52.0 % 34.4  34.1  35.3   Platelets 150 - 400 K/uL 105  102  95        Latest Ref Rng & Units 06/26/2022    3:56 AM 06/25/2022    3:54 AM 06/24/2022    6:39 PM  CMP  Glucose 70 - 99 mg/dL 139  105  288   BUN 8 - 23 mg/dL 96  82  85   Creatinine 0.61 - 1.24 mg/dL 3.84  3.16  3.07   Sodium 135 - 145 mmol/L 137  139  134   Potassium 3.5 - 5.1 mmol/L 3.7  3.1  3.5   Chloride 98 - 111 mmol/L 100  102  98   CO2 22 - 32 mmol/L 27  28  26    Calcium 8.9 - 10.3 mg/dL 8.9  9.1  8.9       Microbiology:  Studies/Results: No results found.   Assessment/Plan: ?serratia bacteremia Serratia UTI Complicated Uti with transplant kidney lower pole fullness pyelonephritis Also BPH with urolift with 6 implants in the prostate  post void bladder scan only 34 ml- normal  will need IV cefepime   ( may be ceftriaxone) for 2-4  weeks. Pt would like to go one once a day antibiotic Will discuss with urology regarding risk of prostate implants getting infected   ESRD- DDRT in 2012 on tacrolimus/mycophenolate   Anemia   Afib   CHF   Pulmonary HTN   Gout   DM management as per primary team   HTN   CAD s/p CABG H/o gastric sleeve surgery  Discussed the management with patient

## 2022-06-26 NOTE — Progress Notes (Signed)
[facility]  ROUNDING NOTE   Subjective:   [name]. presents with shortness of breath, dyspnea on exertion, intermittent fevers and gross hematuria. Patient states he has been taking all his medications including apixaban and his immunomodulating agents: tacrolimus, mycophenolate and prednisone.    Patient seen sitting up in chair, alert and oriented States that she feels better today Remains on room air Appetite slowly improving Lower extremity edema remains, no worse than yesterday  Creatinine 3.84 UOP 1224ml   Objective:  Vital signs in last 24 hours:  Temp:  [97.5 F (36.4 C)-97.8 F (36.6 C)] 97.8 F (36.6 C) (04/03 0759) Pulse Rate:  [76-96] 76 (04/03 0759) Resp:  [18-20] 18 (04/03 0759) BP: (144-151)/(85-102) 144/85 (04/03 0759) SpO2:  [91 %-99 %] 98 % (04/03 0759) Weight:  [126 kg] 126 kg (04/03 0500)  Weight change:  Filed Weights   06/21/22 0840 06/26/22 0500  Weight: 124.3 kg 126 kg    Intake/Output: I/O last 3 completed shifts: In: 1033.3 [P.O.:480; IV Piggyback:553.3] Out: 2200 [Urine:2200]   Intake/Output this shift:  Total I/O In: 240 [P.O.:240] Out: 400 [Urine:400]  Physical Exam: General: NAD  Head: Normocephalic, atraumatic. Moist oral mucosal membranes  Eyes: Anicteric  Lungs:  Clear to auscultation, room air  Heart: Irregular  Abdomen:  Obese, LLQ allograft kidney - no tenderness  Extremities:  1+ peripheral edema.  Neurologic: Nonfocal, moving all four extremities  Skin: No lesions  Access Left upper aVF    Basic Metabolic Panel: Recent Labs  Lab 06/22/22 0230 06/22/22 0233 06/23/22 0500 06/24/22 0326 06/24/22 1839 06/25/22 0354 06/26/22 0356  NA  --    < > 139 139 134* 139 137  K  --    < > 3.5 3.5 3.5 3.1* 3.7  CL  --    < > 101 100 98 102 100  CO2  --    < > 29 30 26 28 27   GLUCOSE  --    < > 125* 156* 288* 105* 139*  BUN  --    < > 63* 73* 85* 82* 96*  CREATININE  --    < > 2.36* 2.61*  3.07* 3.16* 3.84*  CALCIUM  --    < > 9.0 9.0 8.9 9.1 8.9  MG 2.0  --  2.3 2.2  --  2.3  --   PHOS 2.8  --  4.4 3.1  --  3.1  --    < > = values in this interval not displayed.     Liver Function Tests: Recent Labs  Lab 06/21/22 0830 06/22/22 0233  AST 25 27  ALT 14 14  ALKPHOS 114 91  BILITOT 1.3* 1.6*  PROT 7.1 6.5  ALBUMIN 3.4* 2.9*    No results for input(s): "LIPASE", "AMYLASE" in the last 168 hours. Recent Labs  Lab 06/21/22 1119  AMMONIA 19     CBC: Recent Labs  Lab 06/21/22 0830 06/22/22 0233 06/23/22 0500 06/24/22 0326 06/25/22 0354 06/26/22 0356  WBC 10.9* 10.1 4.5 5.0 4.8 3.6*  NEUTROABS 11.1*  --   --   --   --   --   HGB 12.3* 11.2* 10.5* 10.3* 10.4* 10.3*  HCT 41.9 38.2* 35.7* 35.3* 34.1* 34.4*  MCV 78.3* 79.1* 78.8* 78.8* 76.6* 76.6*  PLT 142* 108* 91* 95* 102* 105*     Cardiac Enzymes: Recent Labs  Lab 06/21/22 0830  CKTOTAL 38*     BNP: Invalid input(s): "POCBNP"  CBG: Recent  Labs  Lab 06/26/22 0103 06/26/22 0247 06/26/22 0523 06/26/22 0802 06/26/22 1132  GLUCAP 101* 81 188* 189* 261*     Microbiology: Results for orders placed or performed during the hospital encounter of 06/21/22  Blood culture (routine x 2)     Status: Abnormal   Collection Time: 06/21/22  8:38 AM   Specimen: BLOOD  Result Value Ref Range Status   Specimen Description   Final    BLOOD RIGHT ARM Performed at [facility], [address], [place], [place] [address]    Special Requests   Final    NONE Performed at [facility], [address], [place], [place] [address]    Culture  Setup Time   Final    Organism ID to follow [place] CRITICAL RESULT CALLED TO, READ BACK BY AND VERIFIED WITH: RAQUEL RODRIGUEZ GUZMAN AT [id number] 06/22/22.PMF  REVIEWED BY A. LAFRANCE Performed at [facility], [address], [place], [place] [address]    Culture SERRATIA MARCESCENS (A)  Final   Report Status  06/24/2022 FINAL  Final   Organism ID, Bacteria SERRATIA MARCESCENS  Final      Susceptibility   Serratia marcescens - MIC*    CEFEPIME <=0.12 SENSITIVE Sensitive     CEFTAZIDIME <=1 SENSITIVE Sensitive     CEFTRIAXONE <=0.25 SENSITIVE Sensitive     CIPROFLOXACIN 0.5 INTERMEDIATE Intermediate     GENTAMICIN <=1 SENSITIVE Sensitive     TRIMETH/SULFA >=320 RESISTANT Resistant     * SERRATIA MARCESCENS  Blood Culture ID Panel (Reflexed)     Status: Abnormal   Collection Time: 06/21/22  8:38 AM  Result Value Ref Range Status   Enterococcus faecalis NOT DETECTED NOT DETECTED Final   Enterococcus Faecium NOT DETECTED NOT DETECTED Final   Listeria monocytogenes NOT DETECTED NOT DETECTED Final   Staphylococcus species NOT DETECTED NOT DETECTED Final   Staphylococcus aureus (BCID) NOT DETECTED NOT DETECTED Final   Staphylococcus epidermidis NOT DETECTED NOT DETECTED Final   Staphylococcus lugdunensis NOT DETECTED NOT DETECTED Final   Streptococcus species NOT DETECTED NOT DETECTED Final   Streptococcus agalactiae NOT DETECTED NOT DETECTED Final   Streptococcus pneumoniae NOT DETECTED NOT DETECTED Final   Streptococcus pyogenes NOT DETECTED NOT DETECTED Final   A.calcoaceticus-baumannii NOT DETECTED NOT DETECTED Final   Bacteroides fragilis NOT DETECTED NOT DETECTED Final   Enterobacterales DETECTED (A) NOT DETECTED Final    Comment: Enterobacterales represent a large order of gram negative bacteria, not a single organism. CRITICAL RESULT CALLED TO, READ BACK BY AND VERIFIED WITH: RAQUEL RODRIGUEZ GUZMAN AT [id number] 06/22/22.PMF    Enterobacter cloacae complex NOT DETECTED NOT DETECTED Final   Escherichia coli NOT DETECTED NOT DETECTED Final   Klebsiella aerogenes NOT DETECTED NOT DETECTED Final   Klebsiella oxytoca NOT DETECTED NOT DETECTED Final   Klebsiella pneumoniae NOT DETECTED NOT DETECTED Final   Proteus species NOT DETECTED NOT DETECTED Final   Salmonella species NOT DETECTED NOT  DETECTED Final   Serratia marcescens DETECTED (A) NOT DETECTED Final    Comment: CRITICAL RESULT CALLED TO, READ BACK BY AND VERIFIED WITH: RAQUEL RODRIGUEZ GUZMAN AT [id number] 06/22/22.PMF    Haemophilus influenzae NOT DETECTED NOT DETECTED Final   Neisseria meningitidis NOT DETECTED NOT DETECTED Final   Pseudomonas aeruginosa NOT DETECTED NOT DETECTED Final   Stenotrophomonas maltophilia NOT DETECTED NOT DETECTED Final   Candida albicans NOT DETECTED NOT DETECTED Final   Candida auris NOT DETECTED NOT DETECTED Final  Candida glabrata NOT DETECTED NOT DETECTED Final   Candida krusei NOT DETECTED NOT DETECTED Final   Candida parapsilosis NOT DETECTED NOT DETECTED Final   Candida tropicalis NOT DETECTED NOT DETECTED Final   Cryptococcus neoformans/gattii NOT DETECTED NOT DETECTED Final   CTX-M ESBL NOT DETECTED NOT DETECTED Final   Carbapenem resistance IMP NOT DETECTED NOT DETECTED Final   Carbapenem resistance KPC NOT DETECTED NOT DETECTED Final   Carbapenem resistance NDM NOT DETECTED NOT DETECTED Final   Carbapenem resist OXA 48 LIKE NOT DETECTED NOT DETECTED Final   Carbapenem resistance VIM NOT DETECTED NOT DETECTED Final    Comment: Performed at [facility], [address] [place], [place] [address]  Urine Culture     Status: Abnormal   Collection Time: 06/21/22  8:48 AM   Specimen: Urine, Clean Catch  Result Value Ref Range Status   Specimen Description   Final    URINE, CLEAN CATCH Performed at [facility], [address] [place], [place] [address]    Special Requests   Final    NONE Performed at [facility], [address], [place],  [address]    Culture >=100,000 COLONIES/mL SERRATIA MARCESCENS (A)  Final   Report Status 06/23/2022 FINAL  Final   Organism ID, Bacteria SERRATIA MARCESCENS (A)  Final      Susceptibility   Serratia marcescens - MIC*    CEFEPIME <=0.12 SENSITIVE Sensitive     CEFTRIAXONE <=0.25 SENSITIVE Sensitive      CIPROFLOXACIN 0.5 INTERMEDIATE Intermediate     GENTAMICIN <=1 SENSITIVE Sensitive     NITROFURANTOIN 256 RESISTANT Resistant     TRIMETH/SULFA >=320 RESISTANT Resistant     * >=100,000 COLONIES/mL SERRATIA MARCESCENS  Resp panel by RT-PCR (RSV, Flu A&B, Covid) Anterior Nasal Swab     Status: None   Collection Time: 06/21/22  8:48 AM   Specimen: Anterior Nasal Swab  Result Value Ref Range Status   SARS Coronavirus 2 by RT PCR NEGATIVE NEGATIVE Final    Comment: (NOTE) SARS-CoV-2 target nucleic acids are NOT DETECTED.  The SARS-CoV-2 RNA is generally detectable in upper respiratory specimens during the acute phase of infection. The lowest concentration of SARS-CoV-2 viral copies this assay can detect is 138 copies/mL. A negative result does not preclude SARS-Cov-2 infection and should not be used as the sole basis for treatment or other patient management decisions. A negative result may occur with  improper specimen collection/handling, submission of specimen other than nasopharyngeal swab, presence of viral mutation(s) within the areas targeted by this assay, and inadequate number of viral copies(<138 copies/mL). A negative result must be combined with clinical observations, patient history, and epidemiological information. The expected result is Negative.  Fact Sheet for Patients:  EntrepreneurPulse.com.au  Fact Sheet for Healthcare Providers:  IncredibleEmployment.be  This test is no t yet approved or cleared by the [place] FDA and  has been authorized for detection and/or diagnosis of SARS-CoV-2 by FDA under an Emergency Use Authorization (EUA). This EUA will remain  in effect (meaning this test can be used) for the duration of the COVID-19 declaration under Section 564(b)(1) of the Act, 21 U.S.C.section 360bbb-3(b)(1), unless the authorization is terminated  or revoked sooner.       Influenza A by PCR NEGATIVE NEGATIVE Final    Influenza B by PCR NEGATIVE NEGATIVE Final    Comment: (NOTE) The Xpert Xpress SARS-CoV-2/FLU/RSV plus assay is intended as an aid in the diagnosis of influenza from Nasopharyngeal swab  specimens and should not be used as a sole basis for treatment. Nasal washings and aspirates are unacceptable for Xpert Xpress SARS-CoV-2/FLU/RSV testing.  Fact Sheet for Patients: EntrepreneurPulse.com.au  Fact Sheet for Healthcare Providers: IncredibleEmployment.be  This test is not yet approved or cleared by the [place] FDA and has been authorized for detection and/or diagnosis of SARS-CoV-2 by FDA under an Emergency Use Authorization (EUA). This EUA will remain in effect (meaning this test can be used) for the duration of the COVID-19 declaration under Section 564(b)(1) of the Act, 21 U.S.C. section 360bbb-3(b)(1), unless the authorization is terminated or revoked.     Resp Syncytial Virus by PCR NEGATIVE NEGATIVE Final    Comment: (NOTE) Fact Sheet for Patients: EntrepreneurPulse.com.au  Fact Sheet for Healthcare Providers: IncredibleEmployment.be  This test is not yet approved or cleared by the [place] FDA and has been authorized for detection and/or diagnosis of SARS-CoV-2 by FDA under an Emergency Use Authorization (EUA). This EUA will remain in effect (meaning this test can be used) for the duration of the COVID-19 declaration under Section 564(b)(1) of the Act, 21 U.S.C. section 360bbb-3(b)(1), unless the authorization is terminated or revoked.  Performed at [facility], [address] [place], [place] [address]   Culture, blood (x 2)     Status: None   Collection Time: 06/21/22  1:20 PM   Specimen: BLOOD  Result Value Ref Range Status   Specimen Description BLOOD BLOOD RIGHT ARM  Final   Special Requests   Final    BOTTLES DRAWN AEROBIC AND ANAEROBIC Blood Culture adequate volume    Culture   Final    NO GROWTH 5 DAYS Performed at [facility], [address] [place], [place] [address]    Report Status 06/26/2022 FINAL  Final  Culture, blood (x 2)     Status: None   Collection Time: 06/21/22  1:20 PM   Specimen: BLOOD  Result Value Ref Range Status   Specimen Description BLOOD BLOOD RIGHT HAND  Final   Special Requests   Final    BOTTLES DRAWN AEROBIC AND ANAEROBIC Blood Culture results may not be optimal due to an excessive volume of blood received in culture bottles   Culture   Final    NO GROWTH 5 DAYS Performed at [facility], [address], [place], [place] [address]    Report Status 06/26/2022 FINAL  Final  MRSA Next Gen by PCR, Nasal     Status: None   Collection Time: 06/21/22  1:57 PM   Specimen: Nasal Mucosa; Nasal Swab  Result Value Ref Range Status   MRSA by PCR Next Gen NOT DETECTED NOT DETECTED Final    Comment: (NOTE) The GeneXpert MRSA Assay (FDA approved for NASAL specimens only), is one component of a comprehensive MRSA colonization surveillance program. It is not intended to diagnose MRSA infection nor to guide or monitor treatment for MRSA infections. Test performance is not FDA approved in patients less than 66 years old. Performed at [facility], [address] [place], [place] [address]   Culture, blood (Routine X 2) w Reflex to ID Panel     Status: None (Preliminary result)   Collection Time: 06/23/22  5:00 AM   Specimen: BLOOD  Result Value Ref Range Status   Specimen Description BLOOD BLOOD RIGHT ARM  Final   Special Requests   Final    BOTTLES DRAWN AEROBIC AND ANAEROBIC Blood Culture results may not be optimal due to an  excessive volume of blood received in culture bottles   Culture   Final    NO GROWTH 3 DAYS Performed at [facility], [address] [place], [place] [address]    Report Status PENDING  Incomplete  Culture, blood (Routine X 2) w Reflex to ID Panel     Status:  None (Preliminary result)   Collection Time: 06/23/22  5:14 AM   Specimen: BLOOD  Result Value Ref Range Status   Specimen Description BLOOD BLOOD RIGHT HAND  Final   Special Requests   Final    BOTTLES DRAWN AEROBIC AND ANAEROBIC Blood Culture results may not be optimal due to an excessive volume of blood received in culture bottles   Culture   Final    NO GROWTH 3 DAYS Performed at [facility], [address], [place], [place] [address]    Report Status PENDING  Incomplete   *Note: Due to a large number of results and/or encounters for the requested time period, some results have not been displayed. A complete set of results can be found in Results Review.    Coagulation Studies: No results for input(s): "LABPROT", "INR" in the last 72 hours.   Urinalysis: No results for input(s): "COLORURINE", "LABSPEC", "PHURINE", "GLUCOSEU", "HGBUR", "BILIRUBINUR", "KETONESUR", "PROTEINUR", "UROBILINOGEN", "NITRITE", "LEUKOCYTESUR" in the last 72 hours.  Invalid input(s): "APPERANCEUR"     Imaging: No results found.   Medications:    ceFEPime (MAXIPIME) IV      apixaban  5 mg Oral BID   buPROPion  150 mg Oral BID   insulin aspart  0-15 Units Subcutaneous Q4H   iron polysaccharides  150 mg Oral Daily   montelukast  10 mg Oral QHS   mycophenolate  360 mg Oral BID   pantoprazole  40 mg Oral QHS   predniSONE  5 mg Oral Daily   rosuvastatin  10 mg Oral QHS   saccharomyces boulardii  250 mg Oral BID   tacrolimus  2 mg Oral BID   tamsulosin  0.4 mg Oral QPC breakfast   acetaminophen, alum & mag hydroxide-simeth, budesonide (PULMICORT) nebulizer solution, hydrALAZINE **OR** hydrALAZINE, hydrOXYzine, ipratropium-albuterol, methocarbamol, ondansetron **OR** ondansetron (ZOFRAN) IV  Assessment/ Plan:  Mr. Hulon Herne. is a 62 y.o. Hispanic male renal transplant in 2012, coronary artery disease status post CABG, atrial fibrillation, congestive heart failure, gout,  GERD, hyperlipidemia, pulmonary hypertension, status post gastric bypass, diabetes mellitus type II, who was admitted to [facility] on 06/21/2022 for Pyelonephritis [N12] Acute respiratory failure with hypoxia [J96.01] Sepsis [A41.9] Sepsis, due to unspecified organism, unspecified whether acute organ dysfunction present [A41.9]    Acute kidney injury on chronic kidney disease stage IIIA-T: baseline creatinine of 1.63, GFR of 47 on 05/30/22. Followed by [facility]. Transplant doppler reviewed with patient.  -Tacrolimus level 4.2.. Continue tacrolimus 2g PO bid.  - continue mycophenolate and prednisone - Continue PJP prophylaxis with Bactrim. -Creatinine continues to rise due to aggressive diuresis 06/24/22.  Recommend holding further diuresis at this time.   Pyelonephritis/urinary tract infection/sepsis: cultures growing serratia marcences in urine and blood from 3/29. Pending sensitivities.  - empiric antibiotics: cefepime   Hypotension: with septic shock. Not requiring vasopressors. Holding home blood pressure regimen: hydralazine, isosorbide mononitrate, metolazone, tamsulosin and torsemide. - holding all agents.  -Received Furosemide and Metolazone on 06/24/22   Diabetes mellitus type II with chronic kidney disease: insulin dependent. Hemoglobin A1c of 7.7% on 05/15/22. Holding dapagliflozin.   Glucose elevated at times.  Sliding scale  insulin managed by primary team.   LOS: 5 [place] and [place] 4/3/202411:46 AM

## 2022-06-26 NOTE — TOC Progression Note (Signed)
Transition of Care (TOC) - Progression Note    Patient Details  Name: Rodriquez Juanantonio Caminero. MRN: [id number] Date of Birth: 1960-12-19  Transition of Care [facility]) CM/SW Contact  Beverly Sessions, RN Phone Number: 06/26/2022, 10:39 AM  Clinical Narrative:     Notified that patient would require IV antibiotics at discharge  Met with patient at bedside ID pharmacist has already made referral to [facility] with [employer].  Pam to arranged nursing through [employer]  At this time patient does not feel that any additional RN or PT home health needed        Expected Discharge Plan and Services                                               Social Determinants of Health (SDOH) Interventions SDOH Screenings   Food Insecurity: No Food Insecurity (04/01/2022)  Housing: Low Risk  (04/01/2022)  Transportation Needs: No Transportation Needs (04/01/2022)  Utilities: Not At Risk (04/01/2022)  Depression (PHQ2-9): Low Risk  (10/30/2021)  Tobacco Use: Medium Risk (06/21/2022)    Readmission Risk Interventions    06/24/2022    9:39 AM 11/04/2021   11:44 AM 05/15/2020    2:49 PM  Readmission Risk Prevention Plan  Transportation Screening Complete Complete Complete  PCP or Specialist Appt within 3-5 Days   Complete  HRI or [place]   Complete  Social Work Consult for [place] Planning/Counseling Complete  Complete  Palliative Care Screening Not Applicable  Not Applicable  Medication Review Press photographer) Complete Complete Complete  PCP or Specialist appointment within 3-5 days of discharge  Complete   HRI or [place]  Complete   SW Recovery Care/Counseling Consult  Complete   [place]  Not Applicable

## 2022-06-26 NOTE — Consult Note (Signed)
Pharmacy Antibiotic Note  [name]. is a 62 y.o. male with PMH including CKD s/p renal transplant, CAD, HTN, HLD, gout, asthma, Afib, BPH with LUTS, combined systolic and diastolic CHF, GERD, obesity, DM, PAH, depression admitted on 06/21/2022 with  pyelonephritis . Code sepsis activated in the ED. Pharmacy has been consulted for cefepime dosing. ID is following.  Plan:  Adjust cefepime to 2 g IV q24h  Follow-up cultures, clinical course, duration (patient is immunocompromised status)  Height: 5\' 10"  (177.8 cm) Weight: 126 kg (277 lb 12.5 oz) IBW/kg (Calculated) : 73  Temp (24hrs), Avg:97.8 F (36.6 C), Min:97.5 F (36.4 C), Max:98.2 F (36.8 C)  Recent Labs  Lab 06/21/22 0839 06/21/22 1119 06/22/22 0233 06/23/22 0500 06/24/22 0326 06/24/22 1839 06/25/22 0354 06/26/22 0356  WBC  --   --  10.1 4.5 5.0  --  4.8 3.6*  CREATININE  --   --  2.20* 2.36* 2.61* 3.07* 3.16* 3.84*  LATICACIDVEN 2.1* 1.8  --   --   --   --   --   --      Estimated Creatinine Clearance: 26.6 mL/min (A) (by C-G formula based on SCr of 3.84 mg/dL (H)).    Allergies  Allergen Reactions   Contrast Media [Iodinated Contrast Media] Other (See Comments)    Kidney transplant   Iodine Other (See Comments)    Other reaction(s): Other (See Comments) Kidney transplant   Ibuprofen Other (See Comments)    Due to kidney transplant Due to kidney transplant Due to kidney transplant    Antimicrobials this admission: Vancomycin 3/29 x 1 Metronidazole 3/29 x 1 Azithromycin 3/30 >> 4/1 Cefepime 3/29 >>   Dose adjustments this admission: N/A  Microbiology results: Bcx 3/29: Serratia marcescens (Cipro I, Bactrim R) Ucx 3/29: Serratia marcescens (Cipro I, Bactrim R, NFT R) Bcx 3/31: NGTD  Thank you for allowing pharmacy to be a part of this patient's care.  [name] 06/26/2022 8:03 AM

## 2022-06-26 NOTE — Progress Notes (Signed)
[facility] Progress Note  Patient: [name].    KG:[id number]  DOA: 06/21/2022     Date of Service: the patient was seen and examined on 06/26/2022  Chief Complaint  Patient presents with   Shortness of Breath   Fever   Hematuria   Brief hospital course: [name]. is a 62 y.o. male with PMH of CAD s/p CABG in 2005, ischemic cardiomyopathy LVEF 40 to 45% as per echo Jan 2024, pulmonary hypertension, chronic A. Fib on Eliquis, aortic stenosis, HTN, type 2 DM, ESRD s/p renal transplant in 2012 now with stage III CKD, OSA on CPAP, gout presenting with sepsis, pyelonephritis.  ED w/up: Tmax 101.5, HR 100s, BP 80s to 100s over 50s, on 2 L nasal cannula to keep O2 sats greater than 92%.  Wbx 10.9, Plts 142, LA 2.1---1.8, roponin 90s to 120s, COVID flu and RSV negative. BNP 465. Creatinine 2.2.  CT head grossly stable. CT ch/abd/p: hydronephrosis of transplanted kidney. Noted 2.9 x 1 cm nodular Consolidative opacity in the right lower lobe present since 2021. Changes in liver consistent with cirrhosis splenomegaly with some concern for portal vein hypertension.   Assessment and Plan:  Sepsis, Serratia bacteremia 2/2 pyelonephritis and possible atypical pneumonia Sepsis criteria Tmax 101.5, heart rate 100, hypotensive, WBC elevated, pyelonephritis, lactic acid 2.1, hypoxic respiratory failure S/p IV fluid, DC'd due to pulmonary edema and respiratory failure   Pyelonephritis Baseline history of end-stage renal disease status post renal transplant on chronic immunosuppression  Pending tacrolimus trough level. Continue tacrolimus 2g PO bid.  Continue mycophenolate and prednisone S/p PJP prophylaxis with Bactrim, stopped on 4/3 due to elevated Cr level  S/p vancomycin and Flagyl Continue cefepime 2 g every 12 hourly Urine cx: Serratia marcescens  Blood culture: Serratia marcescens, sensitive to cefepime Repeat blood cultures NGTD  Consulted ID for antibiotics  and duration   AKI ESRD s/p renal transplant Monitor renal functions and urine output Baseline creatinine 1.63 Creatinine 3.16 most likely due to excessive diuresis Cr 3.84 progressively getting worse, torsemide has been held by nephrology, we will continue oral hydration and monitor Nephrology following   Acute hypoxic respiratory failure, multifactorial could be due to pneumonia versus CHF Started empiric azithromycin 500 mg IV daily x 5 days S/p Lasix 60 mg x 1 dose, s/p torsemide stopped on 4/2 due to worsening renal functions Continue diuresis as per Nephrology and Cardiology Continue supplemental O2 inhalation and gradually wean off   CAD (coronary artery disease) S/p CABG in 2005 No active chest pain though with troponins in the 90s on presentation Suspect demand ischemia in the setting of sepsis and pyelonephritis A-fib with RVR on EKG Continue Eliquis Continue telemetry monitor Trop 97--126  consulted cardiology    Chronic systolic congestive heart failure  2D echo January 2024 with LVEF of 40-45%  January 2023 admission does mention episode of cardiorenal syndrome assd w/ diuresis  Noted to be followed by the heart failure clinic Dry weight at hospital dc 03/2022 was 116kg  BNP 465 elevated  Continue torsemide 100 mg p.o. twice daily 4/1 Lasix 80 mg x 1 dose and metolazone 5 mg oral x 1 dose given  Cardiology following  Hypokalemia due to diuresis, Potassium repleted cautiously due to renal failure Monitor electrolytes and replete as needed.   Essential hypertension BP 100s/50s in setting of sepsis, so BP meds were held Blood pressure gradually improved Use Hydralazine prn   H/o asthma, patient has shortness of breath most  likely due to pneumonia and CHF Continue management as above Started DuoNeb every 6 hourly as needed   Type II diabetes mellitus with renal manifestations SSI  A1C     GERD (gastroesophageal reflux disease) PPI   Pulmonary  hypertension due to left heart disease Primarily venous hypertension by [place] 03/2021 admission,  On imdur which has been held due to low blood pressure Cardiology following   Body mass index is 39.31 kg/m.  Interventions:       Diet: Heart healthy/carb modified diet DVT Prophylaxis: Therapeutic Anticoagulation with Eliquis    Advance goals of care discussion: Full code  Family Communication: family was present at bedside, at the time of interview.  The pt provided permission to discuss medical plan with the family. Opportunity was given to ask question and all questions were answered satisfactorily.   Disposition:  Pt is from Home, admitted with sepsis, pyelonephritis, still on IV antibiotics, need to repeat blood cultures, which precludes a safe discharge. Discharge to home, when clinically stable.  May need few days to improve.  Subjective: No significant events overnight, patient was sitting on the bedside recliner, stated that he is feeling much better today and feeling stronger, he is walking around in the hallway.  Denies any specific complaints, no pain.   Physical Exam: General: NAD, lying comfortably, mild respiratory distress Eyes: PERRLA ENT: Oral Mucosa Clear, moist  Neck: no JVD,  Cardiovascular: S1 and S2 Present, no Murmur, irregular rhythm Respiratory: Good air entry bilaterally, increased respiratory efforts, bilateral crackles, no wheezing appreciated Abdomen: Bowel Sound present, Soft and no tenderness,  Skin: no rashes Extremities: mild Pedal edema, no calf tenderness, chronic venous stasis pigmentation Neurologic: without any new focal findings Gait not checked due to patient safety concerns  Vitals:   06/25/22 2016 06/26/22 0500 06/26/22 0522 06/26/22 0759  BP: (!) 144/91  (!) 151/85 (!) 144/85  Pulse: 80  77 76  Resp: 20  20 18   Temp: (!) 97.5 F (36.4 C)  97.8 F (36.6 C) 97.8 F (36.6 C)  TempSrc: Oral  Oral Oral  SpO2: 92%  91% 98%  Weight:   126 kg    Height:        Intake/Output Summary (Last 24 hours) at 06/26/2022 1430 Last data filed at 06/26/2022 1426 Gross per 24 hour  Intake 1173.25 ml  Output 1575 ml  Net -401.75 ml   Filed Weights   06/21/22 0840 06/26/22 0500  Weight: 124.3 kg 126 kg    Data Reviewed: I have personally reviewed and interpreted daily labs, tele strips, imagings as discussed above. I reviewed all nursing notes, pharmacy notes, vitals, pertinent old records I have discussed plan of care as described above with RN and patient/family.  CBC: Recent Labs  Lab 06/21/22 0830 06/22/22 0233 06/23/22 0500 06/24/22 0326 06/25/22 0354 06/26/22 0356  WBC 10.9* 10.1 4.5 5.0 4.8 3.6*  NEUTROABS 11.1*  --   --   --   --   --   HGB 12.3* 11.2* 10.5* 10.3* 10.4* 10.3*  HCT 41.9 38.2* 35.7* 35.3* 34.1* 34.4*  MCV 78.3* 79.1* 78.8* 78.8* 76.6* 76.6*  PLT 142* 108* 91* 95* 102* [id number]*   Basic Metabolic Panel: Recent Labs  Lab 06/22/22 0230 06/22/22 0233 06/23/22 0500 06/24/22 0326 06/24/22 1839 06/25/22 0354 06/26/22 0356  NA  --    < > 139 139 134* 139 137  K  --    < > 3.5 3.5 3.5 3.1* 3.7  CL  --    < >  101 100 98 102 100  CO2  --    < > 29 30 26 28 27   GLUCOSE  --    < > 125* 156* 288* 105* 139*  BUN  --    < > 63* 73* 85* 82* 96*  CREATININE  --    < > 2.36* 2.61* 3.07* 3.16* 3.84*  CALCIUM  --    < > 9.0 9.0 8.9 9.1 8.9  MG 2.0  --  2.3 2.2  --  2.3  --   PHOS 2.8  --  4.4 3.1  --  3.1  --    < > = values in this interval not displayed.    Studies: No results found.  Scheduled Meds:  apixaban  5 mg Oral BID   buPROPion  150 mg Oral BID   insulin aspart  0-15 Units Subcutaneous Q4H   iron polysaccharides  150 mg Oral Daily   montelukast  10 mg Oral QHS   mycophenolate  360 mg Oral BID   pantoprazole  40 mg Oral QHS   predniSONE  5 mg Oral Daily   rosuvastatin  10 mg Oral QHS   saccharomyces boulardii  250 mg Oral BID   tacrolimus  2 mg Oral BID   tamsulosin  0.4 mg Oral QPC  breakfast   Continuous Infusions:  ceFEPime (MAXIPIME) IV     PRN Meds: acetaminophen, alum & mag hydroxide-simeth, budesonide (PULMICORT) nebulizer solution, hydrALAZINE **OR** hydrALAZINE, hydrOXYzine, ipratropium-albuterol, methocarbamol, ondansetron **OR** ondansetron (ZOFRAN) IV  Time spent: 35 minutes  Author: Val Riles. MD [facility] 06/26/2022 2:30 PM  To reach On-call, see care teams to locate the attending and reach out to them via [email or web address]. If 7PM-7AM, please contact night-coverage If you still have difficulty reaching the attending provider, please page the [facility] (Director on Call) for [facility] on amion for assistance.

## 2022-06-26 NOTE — Progress Notes (Signed)
Advanced Heart Failure Rounding Note   Subjective:    Torsemide stopped yesterday  Scr up 3.1 -> 3.8  Says he feels good. No CP or SOB. Drinking a lot of fluids. No f/c.   Urine starting to get lighter    Objective:   Weight Range:  Vital Signs:   Temp:  [97.5 F (36.4 C)-97.8 F (36.6 C)] 97.8 F (36.6 C) (04/03 0759) Pulse Rate:  [76-80] 76 (04/03 0759) Resp:  [18-20] 18 (04/03 0759) BP: (144-151)/(85-91) 144/85 (04/03 0759) SpO2:  [91 %-98 %] 98 % (04/03 0759) Weight:  [126 kg] 126 kg (04/03 0500) Last BM Date : 06/23/22  Weight change: Filed Weights   06/21/22 0840 06/26/22 0500  Weight: 124.3 kg 126 kg    Intake/Output:   Intake/Output Summary (Last 24 hours) at 06/26/2022 1253 Last data filed at 06/26/2022 1128 Gross per 24 hour  Intake 1173.25 ml  Output 1225 ml  Net -51.75 ml      Physical Exam: General: Sitting up in chair No resp difficulty HEENT: normal Neck: supple. JVP 6-7 Carotids 2+ bilat; no bruits. No lymphadenopathy or thryomegaly appreciated. Cor: PMI nondisplaced. Irregular rate & rhythm. No rubs, gallops or murmurs. Lungs: clear Abdomen: soft, nontender, nondistended. No hepatosplenomegaly. No bruits or masses. Good bowel sounds. Extremities: no cyanosis, clubbing, rash, tr edema with chronic venous stasis changes Neuro: alert & orientedx3, cranial nerves grossly intact. moves all 4 extremities w/o difficulty. Affect pleasant   Telemetry: Sinus 70-80s Personally reviewed   Labs: Basic Metabolic Panel: Recent Labs  Lab 06/22/22 0230 06/22/22 0233 06/23/22 0500 06/24/22 0326 06/24/22 1839 06/25/22 0354 06/26/22 0356  NA  --    < > 139 139 134* 139 137  K  --    < > 3.5 3.5 3.5 3.1* 3.7  CL  --    < > 101 100 98 102 100  CO2  --    < > 29 30 26 28 27   GLUCOSE  --    < > 125* 156* 288* 105* 139*  BUN  --    < > 63* 73* 85* 82* 96*  CREATININE  --    < > 2.36* 2.61* 3.07* 3.16* 3.84*  CALCIUM  --    < > 9.0 9.0 8.9 9.1  8.9  MG 2.0  --  2.3 2.2  --  2.3  --   PHOS 2.8  --  4.4 3.1  --  3.1  --    < > = values in this interval not displayed.     Liver Function Tests: Recent Labs  Lab 06/21/22 0830 06/22/22 0233  AST 25 27  ALT 14 14  ALKPHOS 114 91  BILITOT 1.3* 1.6*  PROT 7.1 6.5  ALBUMIN 3.4* 2.9*    No results for input(s): "LIPASE", "AMYLASE" in the last 168 hours. Recent Labs  Lab 06/21/22 1119  AMMONIA 19     CBC: Recent Labs  Lab 06/21/22 0830 06/22/22 0233 06/23/22 0500 06/24/22 0326 06/25/22 0354 06/26/22 0356  WBC 10.9* 10.1 4.5 5.0 4.8 3.6*  NEUTROABS 11.1*  --   --   --   --   --   HGB 12.3* 11.2* 10.5* 10.3* 10.4* 10.3*  HCT 41.9 38.2* 35.7* 35.3* 34.1* 34.4*  MCV 78.3* 79.1* 78.8* 78.8* 76.6* 76.6*  PLT 142* 108* 91* 95* 102* 105*     Cardiac Enzymes: Recent Labs  Lab 06/21/22 0830  CKTOTAL 38*     BNP: BNP (last  3 results) Recent Labs    05/09/22 1218 05/30/22 1149 06/21/22 0848  BNP 352.3* 430.9* 465.0*     ProBNP (last 3 results) No results for input(s): "PROBNP" in the last 8760 hours.    Other results:  Imaging: No results found.   Medications:     Scheduled Medications:  apixaban  5 mg Oral BID   buPROPion  150 mg Oral BID   insulin aspart  0-15 Units Subcutaneous Q4H   iron polysaccharides  150 mg Oral Daily   montelukast  10 mg Oral QHS   mycophenolate  360 mg Oral BID   pantoprazole  40 mg Oral QHS   predniSONE  5 mg Oral Daily   rosuvastatin  10 mg Oral QHS   saccharomyces boulardii  250 mg Oral BID   tacrolimus  2 mg Oral BID   tamsulosin  0.4 mg Oral QPC breakfast    Infusions:  ceFEPime (MAXIPIME) IV      PRN Medications: acetaminophen, alum & mag hydroxide-simeth, budesonide (PULMICORT) nebulizer solution, hydrALAZINE **OR** hydrALAZINE, hydrOXYzine, ipratropium-albuterol, methocarbamol, ondansetron **OR** ondansetron (ZOFRAN) IV   Assessment/Plan:    1.  Pyelonephritis/serratia sepsis -On antibiotics  as per primary team   2.  Acute on chronic systolic HF - Echo EF 40 to 45% - Volume status ok. Scr rising.  - Agree with Renal. Continue to hold diuretics for now.  - Watch renal function closely. Suspect will improve soon.  3.  CAD/CABG -Eliquis, Crestor - No s/s angina  4.  Permanent A-fib - Rate controlled HR 70-80s -Continue Eliquis -History of bradycardia with beta-blockers.   5. ESRD s/p renal transplant with subsequent AKI on CKD 3B-IV - Baseline Scr 2.5-2.7 - Scr up 3.1 -> 3.8 today - Suspect pre-renal. As above, holding diuretics  - Encouraged po intake - Watch renal function closely  - Renal following   Length of Stay: 5   Glori Bickers MD 06/26/2022, 12:53 PM  Advanced Heart Failure Team Pager [phone] (M-F; 7a - 4p)  Please contact [facility] for night-coverage after hours (4p -7a ) and weekends on [email or web address]

## 2022-06-27 DIAGNOSIS — N12 Tubulo-interstitial nephritis, not specified as acute or chronic: Secondary | ICD-10-CM | POA: Diagnosis not present

## 2022-06-27 DIAGNOSIS — R7881 Bacteremia: Secondary | ICD-10-CM | POA: Diagnosis not present

## 2022-06-27 DIAGNOSIS — Z940 Kidney transplant status: Secondary | ICD-10-CM | POA: Diagnosis not present

## 2022-06-27 DIAGNOSIS — N10 Acute tubulo-interstitial nephritis: Secondary | ICD-10-CM | POA: Diagnosis not present

## 2022-06-27 DIAGNOSIS — N390 Urinary tract infection, site not specified: Secondary | ICD-10-CM | POA: Diagnosis not present

## 2022-06-27 LAB — BASIC METABOLIC PANEL
Anion gap: 11 (ref 5–15)
BUN: 100 mg/dL — ABNORMAL HIGH (ref 8–23)
CO2: 26 mmol/L (ref 22–32)
Calcium: 9.1 mg/dL (ref 8.9–10.3)
Chloride: 103 mmol/L (ref 98–111)
Creatinine, Ser: 3.86 mg/dL — ABNORMAL HIGH (ref 0.61–1.24)
GFR, Estimated: 17 mL/min — ABNORMAL LOW (ref >60)
Glucose, Bld: 172 mg/dL — ABNORMAL HIGH (ref 70–99)
Potassium: 3.5 mmol/L (ref 3.5–5.1)
Sodium: 140 mmol/L (ref 135–145)

## 2022-06-27 LAB — CBC
HCT: 33.8 % — ABNORMAL LOW (ref 39.0–52.0)
Hemoglobin: 10.2 g/dL — ABNORMAL LOW (ref 13.0–17.0)
MCH: 22.9 pg — ABNORMAL LOW (ref 26.0–34.0)
MCHC: 30.2 g/dL (ref 30.0–36.0)
MCV: 75.8 fL — ABNORMAL LOW (ref 80.0–100.0)
Platelets: 121 10*3/uL — ABNORMAL LOW (ref 150–400)
RBC: 4.46 MIL/uL (ref 4.22–5.81)
RDW: 18.6 % — ABNORMAL HIGH (ref 11.5–15.5)
WBC: 4 10*3/uL (ref 4.0–10.5)
nRBC: 0 % (ref 0.0–0.2)

## 2022-06-27 LAB — GLUCOSE, CAPILLARY
Glucose-Capillary: 174 mg/dL — ABNORMAL HIGH (ref 70–99)
Glucose-Capillary: 173 mg/dL — ABNORMAL HIGH (ref 70–99)
Glucose-Capillary: 217 mg/dL — ABNORMAL HIGH (ref 70–99)
Glucose-Capillary: 202 mg/dL — ABNORMAL HIGH (ref 70–99)
Glucose-Capillary: 302 mg/dL — ABNORMAL HIGH (ref 70–99)
Glucose-Capillary: 300 mg/dL — ABNORMAL HIGH (ref 70–99)
Glucose-Capillary: 364 mg/dL — ABNORMAL HIGH (ref 70–99)
Glucose-Capillary: 201 mg/dL — ABNORMAL HIGH (ref 70–99)

## 2022-06-27 NOTE — Progress Notes (Signed)
Advanced Heart Failure Rounding Note   Subjective:    Remains off diuretics. Says he feels like he is starting to retain more fluid.   Denies SOB, orthopnea or PND. Reports urine output is picking up. Weight up 6 pounds in 2 days   Scr up 3.1 -> 3.8 -> 3.8  Objective:   Weight Range:  Vital Signs:   Temp:  [97.8 F (36.6 C)-98.1 F (36.7 C)] 98.1 F (36.7 C) (04/04 0752) Pulse Rate:  [64-86] 86 (04/04 0752) Resp:  [16-20] 18 (04/04 0752) BP: (142-164)/(76-98) 148/76 (04/04 0752) SpO2:  [96 %-99 %] 96 % (04/04 0752) Weight:  [127.3 kg] 127.3 kg (04/04 0450) Last BM Date : 06/26/22  Weight change: Filed Weights   06/21/22 0840 06/26/22 0500 06/27/22 0450  Weight: 124.3 kg 126 kg 127.3 kg    Intake/Output:   Intake/Output Summary (Last 24 hours) at 06/27/2022 1130 Last data filed at 06/27/2022 0605 Gross per 24 hour  Intake 1080 ml  Output 1850 ml  Net -770 ml      Physical Exam: General: Sitting up in chair No resp difficulty HEENT: normal Neck: supple. JVP to ear . Carotids 2+ bilat; no bruits. No lymphadenopathy or thryomegaly appreciated. Cor: PMI nondisplaced. Irregular rate & rhythm. No rubs, gallops or murmurs. Lungs: clear Abdomen: soft, nontender, nondistended. No hepatosplenomegaly. No bruits or masses. Good bowel sounds. Extremities: no cyanosis, clubbing, rash, 1+ edema Neuro: alert & orientedx3, cranial nerves grossly intact. moves all 4 extremities w/o difficulty. Affect pleasant   Telemetry: Sinus 80s Personally reviewed   Labs: Basic Metabolic Panel: Recent Labs  Lab 06/22/22 0230 06/22/22 0233 06/23/22 0500 06/24/22 0326 06/24/22 1839 06/25/22 0354 06/26/22 0356 06/27/22 0419  NA  --    < > 139 139 134* 139 137 140  K  --    < > 3.5 3.5 3.5 3.1* 3.7 3.5  CL  --    < > 101 100 98 102 100 103  CO2  --    < > 29 30 26 28 27 26   GLUCOSE  --    < > 125* 156* 288* 105* 139* 172*  BUN  --    < > 63* 73* 85* 82* 96* 100*  CREATININE   --    < > 2.36* 2.61* 3.07* 3.16* 3.84* 3.86*  CALCIUM  --    < > 9.0 9.0 8.9 9.1 8.9 9.1  MG 2.0  --  2.3 2.2  --  2.3  --   --   PHOS 2.8  --  4.4 3.1  --  3.1  --   --    < > = values in this interval not displayed.     Liver Function Tests: Recent Labs  Lab 06/21/22 0830 06/22/22 0233  AST 25 27  ALT 14 14  ALKPHOS 114 91  BILITOT 1.3* 1.6*  PROT 7.1 6.5  ALBUMIN 3.4* 2.9*    No results for input(s): "LIPASE", "AMYLASE" in the last 168 hours. Recent Labs  Lab 06/21/22 1119  AMMONIA 19     CBC: Recent Labs  Lab 06/21/22 0830 06/22/22 0233 06/23/22 0500 06/24/22 0326 06/25/22 0354 06/26/22 0356 06/27/22 0419  WBC 10.9*   < > 4.5 5.0 4.8 3.6* 4.0  NEUTROABS 11.1*  --   --   --   --   --   --   HGB 12.3*   < > 10.5* 10.3* 10.4* 10.3* 10.2*  HCT 41.9   < >  35.7* 35.3* 34.1* 34.4* 33.8*  MCV 78.3*   < > 78.8* 78.8* 76.6* 76.6* 75.8*  PLT 142*   < > 91* 95* 102* 105* 121*   < > = values in this interval not displayed.     Cardiac Enzymes: Recent Labs  Lab 06/21/22 0830  CKTOTAL 38*     BNP: BNP (last 3 results) Recent Labs    05/09/22 1218 05/30/22 1149 06/21/22 0848  BNP 352.3* 430.9* 465.0*     ProBNP (last 3 results) No results for input(s): "PROBNP" in the last 8760 hours.    Other results:  Imaging: No results found.   Medications:     Scheduled Medications:  apixaban  5 mg Oral BID   buPROPion  150 mg Oral BID   insulin aspart  0-15 Units Subcutaneous Q4H   iron polysaccharides  150 mg Oral Daily   montelukast  10 mg Oral QHS   mycophenolate  360 mg Oral BID   pantoprazole  40 mg Oral QHS   predniSONE  5 mg Oral Daily   rosuvastatin  10 mg Oral QHS   saccharomyces boulardii  250 mg Oral BID   tacrolimus  2 mg Oral BID   tamsulosin  0.4 mg Oral QPC breakfast    Infusions:  ceFEPime (MAXIPIME) IV 2 g (06/26/22 2208)    PRN Medications: acetaminophen, alum & mag hydroxide-simeth, budesonide (PULMICORT) nebulizer  solution, hydrALAZINE **OR** hydrALAZINE, hydrOXYzine, ipratropium-albuterol, methocarbamol, ondansetron **OR** ondansetron (ZOFRAN) IV   Assessment/Plan:    1.  Pyelonephritis/serratia sepsis -On antibiotics as per primary team   2.  Acute on chronic systolic HF - Echo EF 40 to 45% - Diuretics on hold due to AKI. Volume status now elevated - Suspect we should restart diuretics today but will defer to Renal given previous renal transplant and tenuous renal function  - If not responding can consider RHC  3.  CAD/CABG -Eliquis, Crestor - No s/s angina  4.  Permanent A-fib - Rate controlled HR 70-80s -Continue Eliquis -History of bradycardia with beta-blockers. - No change   5. ESRD s/p renal transplant with subsequent AKI on CKD 3B-IV - Baseline Scr 2.5-2.7 - Scr up 3.1 -> 3.8 -> 3.8  today - Suspect pre-renal.  - Suspect we should restart diuretics today but will defer to Renal given previous renal transplant and tenuous renal function  - If not responding can consider RHC   Length of Stay: 6   Tamryn Popko MD 06/27/2022, 11:30 AM  Advanced Heart Failure Team Pager [phone] (M-F; 7a - 4p)  Please contact [facility] for night-coverage after hours (4p -7a ) and weekends on [email or web address]

## 2022-06-27 NOTE — Progress Notes (Signed)
[facility]  ROUNDING NOTE   Subjective:   [name] presents with shortness of breath, dyspnea on exertion, intermittent fevers and gross hematuria. Patient states he has been taking all his medications including apixaban and his immunomodulating agents: tacrolimus, mycophenolate and prednisone.    Patient seen sitting in chair, alert and oriented Tolerating meals Lower extremity edema appears worse Remains on room air  Creatinine stable 3.86 UOP 2.25L  Objective:  Vital signs in last 24 hours:  Temp:  [97.8 F (36.6 C)-98.1 F (36.7 C)] 98.1 F (36.7 C) (04/04 0752) Pulse Rate:  [64-86] 86 (04/04 0752) Resp:  [16-20] 18 (04/04 0752) BP: (142-164)/(76-98) 148/76 (04/04 0752) SpO2:  [96 %-99 %] 96 % (04/04 0752) Weight:  [127.3 kg] 127.3 kg (04/04 0450)  Weight change: 1.3 kg Filed Weights   06/21/22 0840 06/26/22 0500 06/27/22 0450  Weight: 124.3 kg 126 kg 127.3 kg    Intake/Output: I/O last 3 completed shifts: In: 1780 [P.O.:1680; IV Piggyback:100] Out: 2675 [Urine:2675]   Intake/Output this shift:  No intake/output data recorded.  Physical Exam: General: NAD  Head: Normocephalic, atraumatic. Moist oral mucosal membranes  Eyes: Anicteric  Lungs:  Clear to auscultation, room air  Heart: Irregular  Abdomen:  Obese, LLQ allograft kidney - no tenderness  Extremities:  1+-2+ peripheral edema.  Neurologic: Alert and oriented, moving all four extremities  Skin: No lesions  Access Left upper aVF    Basic Metabolic Panel: Recent Labs  Lab 06/22/22 0230 06/22/22 0233 06/23/22 0500 06/24/22 0326 06/24/22 1839 06/25/22 0354 06/26/22 0356 06/27/22 0419  NA  --    < > 139 139 134* 139 137 140  K  --    < > 3.5 3.5 3.5 3.1* 3.7 3.5  CL  --    < > 101 100 98 102 100 103  CO2  --    < > 29 30 26 28 27 26   GLUCOSE  --    < > 125* 156* 288* 105* 139* 172*  BUN  --    < > 63* 73* 85* 82* 96* 100*  CREATININE  --    < > 2.36* 2.61*  3.07* 3.16* 3.84* 3.86*  CALCIUM  --    < > 9.0 9.0 8.9 9.1 8.9 9.1  MG 2.0  --  2.3 2.2  --  2.3  --   --   PHOS 2.8  --  4.4 3.1  --  3.1  --   --    < > = values in this interval not displayed.     Liver Function Tests: Recent Labs  Lab 06/21/22 0830 06/22/22 0233  AST 25 27  ALT 14 14  ALKPHOS 114 91  BILITOT 1.3* 1.6*  PROT 7.1 6.5  ALBUMIN 3.4* 2.9*    No results for input(s): "LIPASE", "AMYLASE" in the last 168 hours. Recent Labs  Lab 06/21/22 1119  AMMONIA 19     CBC: Recent Labs  Lab 06/21/22 0830 06/22/22 0233 06/23/22 0500 06/24/22 0326 06/25/22 0354 06/26/22 0356 06/27/22 0419  WBC 10.9*   < > 4.5 5.0 4.8 3.6* 4.0  NEUTROABS 11.1*  --   --   --   --   --   --   HGB 12.3*   < > 10.5* 10.3* 10.4* 10.3* 10.2*  HCT 41.9   < > 35.7* 35.3* 34.1* 34.4* 33.8*  MCV 78.3*   < > 78.8* 78.8* 76.6* 76.6* 75.8*  PLT 142*   < >  91* 95* 102* 105* 121*   < > = values in this interval not displayed.     Cardiac Enzymes: Recent Labs  Lab 06/21/22 0830  CKTOTAL 38*     BNP: Invalid input(s): "POCBNP"  CBG: Recent Labs  Lab 06/26/22 2141 06/27/22 0228 06/27/22 0452 06/27/22 0756 06/27/22 0952  GLUCAP 223* 174* 173* 217* 202*     Microbiology: Results for orders placed or performed during the hospital encounter of 06/21/22  Blood culture (routine x 2)     Status: Abnormal   Collection Time: 06/21/22  8:38 AM   Specimen: BLOOD  Result Value Ref Range Status   Specimen Description   Final    BLOOD RIGHT ARM Performed at [facility], [address], [place], [place] [address]    Special Requests   Final    NONE Performed at [facility], [address] [place], [place] [address]    Culture  Setup Time   Final    Organism ID to follow [place] CRITICAL RESULT CALLED TO, READ BACK BY AND VERIFIED WITH: RAQUEL RODRIGUEZ GUZMAN AT [id number] 06/22/22.PMF  REVIEWED BY A. LAFRANCE Performed at [facility], [place] [address], [place], [place] [address]    Culture SERRATIA MARCESCENS (A)  Final   Report Status 06/24/2022 FINAL  Final   Organism ID, Bacteria SERRATIA MARCESCENS  Final      Susceptibility   Serratia marcescens - MIC*    CEFEPIME <=0.12 SENSITIVE Sensitive     CEFTAZIDIME <=1 SENSITIVE Sensitive     CEFTRIAXONE <=0.25 SENSITIVE Sensitive     CIPROFLOXACIN 0.5 INTERMEDIATE Intermediate     GENTAMICIN <=1 SENSITIVE Sensitive     TRIMETH/SULFA >=320 RESISTANT Resistant     * SERRATIA MARCESCENS  Blood Culture ID Panel (Reflexed)     Status: Abnormal   Collection Time: 06/21/22  8:38 AM  Result Value Ref Range Status   Enterococcus faecalis NOT DETECTED NOT DETECTED Final   Enterococcus Faecium NOT DETECTED NOT DETECTED Final   Listeria monocytogenes NOT DETECTED NOT DETECTED Final   Staphylococcus species NOT DETECTED NOT DETECTED Final   Staphylococcus aureus (BCID) NOT DETECTED NOT DETECTED Final   Staphylococcus epidermidis NOT DETECTED NOT DETECTED Final   Staphylococcus lugdunensis NOT DETECTED NOT DETECTED Final   Streptococcus species NOT DETECTED NOT DETECTED Final   Streptococcus agalactiae NOT DETECTED NOT DETECTED Final   Streptococcus pneumoniae NOT DETECTED NOT DETECTED Final   Streptococcus pyogenes NOT DETECTED NOT DETECTED Final   A.calcoaceticus-baumannii NOT DETECTED NOT DETECTED Final   Bacteroides fragilis NOT DETECTED NOT DETECTED Final   Enterobacterales DETECTED (A) NOT DETECTED Final    Comment: Enterobacterales represent a large order of gram negative bacteria, not a single organism. CRITICAL RESULT CALLED TO, READ BACK BY AND VERIFIED WITH: RAQUEL RODRIGUEZ GUZMAN AT [id number] 06/22/22.PMF    Enterobacter cloacae complex NOT DETECTED NOT DETECTED Final   Escherichia coli NOT DETECTED NOT DETECTED Final   Klebsiella aerogenes NOT DETECTED NOT DETECTED Final   Klebsiella oxytoca NOT DETECTED NOT DETECTED Final   Klebsiella pneumoniae NOT  DETECTED NOT DETECTED Final   Proteus species NOT DETECTED NOT DETECTED Final   Salmonella species NOT DETECTED NOT DETECTED Final   Serratia marcescens DETECTED (A) NOT DETECTED Final    Comment: CRITICAL RESULT CALLED TO, READ BACK BY AND VERIFIED WITH: RAQUEL RODRIGUEZ GUZMAN AT [id number] 06/22/22.PMF    Haemophilus influenzae NOT DETECTED NOT DETECTED Final   Neisseria  meningitidis NOT DETECTED NOT DETECTED Final   Pseudomonas aeruginosa NOT DETECTED NOT DETECTED Final   Stenotrophomonas maltophilia NOT DETECTED NOT DETECTED Final   Candida albicans NOT DETECTED NOT DETECTED Final   Candida auris NOT DETECTED NOT DETECTED Final   Candida glabrata NOT DETECTED NOT DETECTED Final   Candida krusei NOT DETECTED NOT DETECTED Final   Candida parapsilosis NOT DETECTED NOT DETECTED Final   Candida tropicalis NOT DETECTED NOT DETECTED Final   Cryptococcus neoformans/gattii NOT DETECTED NOT DETECTED Final   CTX-M ESBL NOT DETECTED NOT DETECTED Final   Carbapenem resistance IMP NOT DETECTED NOT DETECTED Final   Carbapenem resistance KPC NOT DETECTED NOT DETECTED Final   Carbapenem resistance NDM NOT DETECTED NOT DETECTED Final   Carbapenem resist OXA 48 LIKE NOT DETECTED NOT DETECTED Final   Carbapenem resistance VIM NOT DETECTED NOT DETECTED Final    Comment: Performed at [facility], [address] [place], [place] [address]  Urine Culture     Status: Abnormal   Collection Time: 06/21/22  8:48 AM   Specimen: Urine, Clean Catch  Result Value Ref Range Status   Specimen Description   Final    URINE, CLEAN CATCH Performed at [facility], [address] [place], [place] [address]    Special Requests   Final    NONE Performed at [facility], [address] [place], [place] [address]    Culture >=100,000 COLONIES/mL SERRATIA MARCESCENS (A)  Final   Report Status 06/23/2022 FINAL  Final   Organism ID, Bacteria SERRATIA MARCESCENS (A)  Final       Susceptibility   Serratia marcescens - MIC*    CEFEPIME <=0.12 SENSITIVE Sensitive     CEFTRIAXONE <=0.25 SENSITIVE Sensitive     CIPROFLOXACIN 0.5 INTERMEDIATE Intermediate     GENTAMICIN <=1 SENSITIVE Sensitive     NITROFURANTOIN 256 RESISTANT Resistant     TRIMETH/SULFA >=320 RESISTANT Resistant     * >=100,000 COLONIES/mL SERRATIA MARCESCENS  Resp panel by RT-PCR (RSV, Flu A&B, Covid) Anterior Nasal Swab     Status: None   Collection Time: 06/21/22  8:48 AM   Specimen: Anterior Nasal Swab  Result Value Ref Range Status   SARS Coronavirus 2 by RT PCR NEGATIVE NEGATIVE Final    Comment: (NOTE) SARS-CoV-2 target nucleic acids are NOT DETECTED.  The SARS-CoV-2 RNA is generally detectable in upper respiratory specimens during the acute phase of infection. The lowest concentration of SARS-CoV-2 viral copies this assay can detect is 138 copies/mL. A negative result does not preclude SARS-Cov-2 infection and should not be used as the sole basis for treatment or other patient management decisions. A negative result may occur with  improper specimen collection/handling, submission of specimen other than nasopharyngeal swab, presence of viral mutation(s) within the areas targeted by this assay, and inadequate number of viral copies(<138 copies/mL). A negative result must be combined with clinical observations, patient history, and epidemiological information. The expected result is Negative.  Fact Sheet for Patients:  EntrepreneurPulse.com.au  Fact Sheet for Healthcare Providers:  IncredibleEmployment.be  This test is no t yet approved or cleared by the [place] FDA and  has been authorized for detection and/or diagnosis of SARS-CoV-2 by FDA under an Emergency Use Authorization (EUA). This EUA will remain  in effect (meaning this test can be used) for the duration of the COVID-19 declaration under Section 564(b)(1) of the Act, 21 U.S.C.section  360bbb-3(b)(1), unless the authorization is terminated  or revoked sooner.  Influenza A by PCR NEGATIVE NEGATIVE Final   Influenza B by PCR NEGATIVE NEGATIVE Final    Comment: (NOTE) The Xpert Xpress SARS-CoV-2/FLU/RSV plus assay is intended as an aid in the diagnosis of influenza from Nasopharyngeal swab specimens and should not be used as a sole basis for treatment. Nasal washings and aspirates are unacceptable for Xpert Xpress SARS-CoV-2/FLU/RSV testing.  Fact Sheet for Patients: EntrepreneurPulse.com.au  Fact Sheet for Healthcare Providers: IncredibleEmployment.be  This test is not yet approved or cleared by the [place] FDA and has been authorized for detection and/or diagnosis of SARS-CoV-2 by FDA under an Emergency Use Authorization (EUA). This EUA will remain in effect (meaning this test can be used) for the duration of the COVID-19 declaration under Section 564(b)(1) of the Act, 21 U.S.C. section 360bbb-3(b)(1), unless the authorization is terminated or revoked.     Resp Syncytial Virus by PCR NEGATIVE NEGATIVE Final    Comment: (NOTE) Fact Sheet for Patients: EntrepreneurPulse.com.au  Fact Sheet for Healthcare Providers: IncredibleEmployment.be  This test is not yet approved or cleared by the [place] FDA and has been authorized for detection and/or diagnosis of SARS-CoV-2 by FDA under an Emergency Use Authorization (EUA). This EUA will remain in effect (meaning this test can be used) for the duration of the COVID-19 declaration under Section 564(b)(1) of the Act, 21 U.S.C. section 360bbb-3(b)(1), unless the authorization is terminated or revoked.  Performed at [facility], [name] [place], [place] [address]   Culture, blood (x 2)     Status: None   Collection Time: 06/21/22  1:20 PM   Specimen: BLOOD  Result Value Ref Range Status   Specimen  Description BLOOD BLOOD RIGHT ARM  Final   Special Requests   Final    BOTTLES DRAWN AEROBIC AND ANAEROBIC Blood Culture adequate volume   Culture   Final    NO GROWTH 5 DAYS Performed at [facility], [address] [place], [place] [address]    Report Status 06/26/2022 FINAL  Final  Culture, blood (x 2)     Status: None   Collection Time: 06/21/22  1:20 PM   Specimen: BLOOD  Result Value Ref Range Status   Specimen Description BLOOD BLOOD RIGHT HAND  Final   Special Requests   Final    BOTTLES DRAWN AEROBIC AND ANAEROBIC Blood Culture results may not be optimal due to an excessive volume of blood received in culture bottles   Culture   Final    NO GROWTH 5 DAYS Performed at [facility], [address], [place], [place] [address]    Report Status 06/26/2022 FINAL  Final  MRSA Next Gen by PCR, Nasal     Status: None   Collection Time: 06/21/22  1:57 PM   Specimen: Nasal Mucosa; Nasal Swab  Result Value Ref Range Status   MRSA by PCR Next Gen NOT DETECTED NOT DETECTED Final    Comment: (NOTE) The GeneXpert MRSA Assay (FDA approved for NASAL specimens only), is one component of a comprehensive MRSA colonization surveillance program. It is not intended to diagnose MRSA infection nor to guide or monitor treatment for MRSA infections. Test performance is not FDA approved in patients less than 74 years old. Performed at [facility], [address] [place], [place] [address]   Culture, blood (Routine X 2) w Reflex to ID Panel     Status: None (Preliminary result)   Collection Time: 06/23/22  5:00 AM   Specimen: BLOOD  Result Value Ref Range Status   Specimen Description BLOOD BLOOD RIGHT ARM  Final   Special Requests   Final    BOTTLES DRAWN AEROBIC AND ANAEROBIC Blood Culture results may not be optimal due to an excessive volume of blood received in culture bottles   Culture   Final    NO GROWTH 3 DAYS Performed at [facility], [address] [place], [place] [address]    Report Status PENDING  Incomplete  Culture, blood (Routine X 2) w Reflex to ID Panel     Status: None (Preliminary result)   Collection Time: 06/23/22  5:14 AM   Specimen: BLOOD  Result Value Ref Range Status   Specimen Description BLOOD BLOOD RIGHT HAND  Final   Special Requests   Final    BOTTLES DRAWN AEROBIC AND ANAEROBIC Blood Culture results may not be optimal due to an excessive volume of blood received in culture bottles   Culture   Final    NO GROWTH 3 DAYS Performed at [facility], [address], [place], [place] [address]    Report Status PENDING  Incomplete   *Note: Due to a large number of results and/or encounters for the requested time period, some results have not been displayed. A complete set of results can be found in Results Review.    Coagulation Studies: No results for input(s): "LABPROT", "INR" in the last 72 hours.   Urinalysis: No results for input(s): "COLORURINE", "LABSPEC", "PHURINE", "GLUCOSEU", "HGBUR", "BILIRUBINUR", "KETONESUR", "PROTEINUR", "UROBILINOGEN", "NITRITE", "LEUKOCYTESUR" in the last 72 hours.  Invalid input(s): "APPERANCEUR"     Imaging: No results found.   Medications:    ceFEPime (MAXIPIME) IV 2 g (06/26/22 2208)    apixaban  5 mg Oral BID   buPROPion  150 mg Oral BID   insulin aspart  0-15 Units Subcutaneous Q4H   iron polysaccharides  150 mg Oral Daily   montelukast  10 mg Oral QHS   mycophenolate  360 mg Oral BID   pantoprazole  40 mg Oral QHS   predniSONE  5 mg Oral Daily   rosuvastatin  10 mg Oral QHS   saccharomyces boulardii  250 mg Oral BID   tacrolimus  2 mg Oral BID   tamsulosin  0.4 mg Oral QPC breakfast   acetaminophen, alum & mag hydroxide-simeth, budesonide (PULMICORT) nebulizer solution, hydrALAZINE **OR** hydrALAZINE, hydrOXYzine, ipratropium-albuterol, methocarbamol, ondansetron **OR** ondansetron (ZOFRAN) IV  Assessment/ Plan:  [name]. is a 62 y.o. Hispanic male renal transplant in 2012, coronary artery disease status post CABG, atrial fibrillation, congestive heart failure, gout, GERD, hyperlipidemia, pulmonary hypertension, status post gastric bypass, diabetes mellitus type II, who was admitted to [facility] on 06/21/2022 for Pyelonephritis [N12] Acute respiratory failure with hypoxia [J96.01] Sepsis [A41.9] Sepsis, due to unspecified organism, unspecified whether acute organ dysfunction present [A41.9]    Acute kidney injury on chronic kidney disease stage IIIA-T: baseline creatinine of 1.63, GFR of 47 on 05/30/22. Followed by [facility]. Transplant doppler reviewed with patient.  -Tacrolimus level 4.2.. Continue tacrolimus 2g PO bid.  - continue mycophenolate and prednisone - Continue PJP prophylaxis with Bactrim. -Creatinine remains elevated but stable.  Will recommend holding diuresis for 1 additional day.  Will reassess tomorrow.  Pyelonephritis/urinary tract infection/sepsis: cultures growing serratia marcences in urine and blood from 3/29. Pending sensitivities.  - empiric antibiotics: cefepime   Hypotension: with septic shock. Not requiring vasopressors. Holding home blood pressure regimen: hydralazine, isosorbide mononitrate, metolazone, tamsulosin  and torsemide. -Antihypertensives remain held -Received Furosemide and Metolazone on 06/24/22   Diabetes mellitus type II with chronic kidney disease: insulin dependent. Hemoglobin A1c of 7.7% on 05/15/22. Holding dapagliflozin.   Glucose elevated at times.  Sliding scale insulin managed by primary team.   LOS: 6   4/4/202412:31 PM

## 2022-06-27 NOTE — Progress Notes (Signed)
[facility] Progress Note  Patient: [name].    [id number]  DOA: 06/21/2022     Date of Service: the patient was seen and examined on 06/27/2022  Chief Complaint  Patient presents with   Shortness of Breath   Fever   Hematuria   Brief hospital course: [name]. is a 62 y.o. male with PMH of CAD s/p CABG in 2005, ischemic cardiomyopathy LVEF 40 to 45% as per echo Jan 2024, pulmonary hypertension, chronic A. Fib on Eliquis, aortic stenosis, HTN, type 2 DM, ESRD s/p renal transplant in 2012 now with stage III CKD, OSA on CPAP, gout presenting with sepsis, pyelonephritis.  ED w/up: Tmax 101.5, HR 100s, BP 80s to 100s over 50s, on 2 L nasal cannula to keep O2 sats greater than 92%.  Wbx 10.9, Plts 142, LA 2.1---1.8, roponin 90s to 120s, COVID flu and RSV negative. BNP 465. Creatinine 2.2.  CT head grossly stable. CT ch/abd/p: hydronephrosis of transplanted kidney. Noted 2.9 x 1 cm nodular Consolidative opacity in the right lower lobe present since 2021. Changes in liver consistent with cirrhosis splenomegaly with some concern for portal vein hypertension.   Assessment and Plan:  # Sepsis, Serratia bacteremia 2/2 pyelonephritis and possible atypical pneumonia Sepsis criteria Tmax 101.5, heart rate 100, hypotensive, WBC elevated, pyelonephritis, lactic acid 2.1, hypoxic respiratory failure S/p IV fluid, DC'd due to pulmonary edema and respiratory failure   # Pyelonephritis Baseline history of end-stage renal disease status post renal transplant on chronic immunosuppression  Pending tacrolimus trough level. Continue tacrolimus 2g PO bid.  Continue mycophenolate and prednisone S/p PJP prophylaxis with Bactrim, stopped on 4/3 due to elevated Cr level  S/p vancomycin and Flagyl Continue cefepime 2 g every 12 hourly Urine cx: Serratia marcescens  Blood culture: Serratia marcescens, sensitive to cefepime Repeat blood cultures NGTD  Consulted ID for  antibiotics and duration   # AKI ESRD s/p renal transplant Monitor renal functions and urine output Baseline creatinine 1.63 Creatinine 3.16 most likely due to excessive diuresis Cr 3.84 --3.86  progressively getting worse, torsemide has been held by nephrology, we will continue oral hydration and monitor Nephrology following   Acute hypoxic respiratory failure, multifactorial could be due to pneumonia versus CHF Started empiric azithromycin 500 mg IV daily x 5 days S/p Lasix 60 mg x 1 dose, s/p torsemide stopped on 4/2 due to worsening renal functions Continue diuresis as per Nephrology and Cardiology Continue supplemental O2 inhalation and gradually wean off   CAD (coronary artery disease) S/p CABG in 2005 No active chest pain though with troponins in the 90s on presentation Suspect demand ischemia in the setting of sepsis and pyelonephritis A-fib with RVR on EKG Continue Eliquis Continue telemetry monitor Trop 97--126  consulted cardiology    Chronic systolic congestive heart failure  2D echo January 2024 with LVEF of 40-45%  January 2023 admission does mention episode of cardiorenal syndrome assd w/ diuresis  Noted to be followed by the heart failure clinic Dry weight at hospital dc 03/2022 was 116kg  BNP 465 elevated  S/p Torsemide held due to AKI 4/1 Lasix 80 mg x 1 dose and metolazone 5 mg oral x 1 dose given  Cardiology following  Hypokalemia due to diuresis, Potassium repleted cautiously due to renal failure Monitor electrolytes and replete as needed.   Essential hypertension BP 100s/50s in setting of sepsis, so BP meds were held Blood pressure gradually improved Use Hydralazine prn   H/o asthma, patient has  shortness of breath most likely due to pneumonia and CHF Continue management as above Started DuoNeb every 6 hourly as needed   Type II diabetes mellitus with renal manifestations SSI  A1C     GERD (gastroesophageal reflux disease) PPI    Pulmonary hypertension due to left heart disease Primarily venous hypertension by [place] 03/2021 admission,  On imdur which has been held due to low blood pressure Cardiology following   Body mass index is 39.31 kg/m.  Interventions:       Diet: Heart healthy/carb modified diet DVT Prophylaxis: Therapeutic Anticoagulation with Eliquis    Advance goals of care discussion: Full code  Family Communication: family was present at bedside, at the time of interview.  The pt provided permission to discuss medical plan with the family. Opportunity was given to ask question and all questions were answered satisfactorily.   Disposition:  Pt is from Home, admitted with sepsis, pyelonephritis, still on IV antibiotics, and elevated Creatinine which precludes a safe discharge. Discharge to home, when clinically stable.  May need few days to improve.  Subjective: No significant events overnight, patient feels worsening of edema in the bilateral lower extremities, feels swollen, denies any shortness of breath, no chest pain or palpitation, no abdominal pain.   Physical Exam: General: NAD, lying comfortably, mild respiratory distress Eyes: PERRLA ENT: Oral Mucosa Clear, moist  Neck: no JVD,  Cardiovascular: S1 and S2 Present, no Murmur, irregular rhythm Respiratory: Good air entry bilaterally, increased respiratory efforts, bilateral crackles, no wheezing appreciated Abdomen: Bowel Sound present, Soft and no tenderness,  Skin: no rashes Extremities: 2-3+ Pedal edema, no calf tenderness, chronic venous stasis pigmentation Neurologic: without any new focal findings Gait not checked due to patient safety concerns  Vitals:   06/26/22 2201 06/27/22 0450 06/27/22 0752 06/27/22 1000  BP: (!) 142/84 (!) 164/86 (!) 148/76 136/80  Pulse: 81 64 86 88  Resp: 18 16 18    Temp: 97.8 F (36.6 C)  98.1 F (36.7 C) 98.2 F (36.8 C)  TempSrc: Oral  Oral Oral  SpO2: 97% 99% 96% 98%  Weight:  127.3 kg     Height:        Intake/Output Summary (Last 24 hours) at 06/27/2022 1532 Last data filed at 06/27/2022 [id number] Gross per 24 hour  Intake 840 ml  Output 1500 ml  Net -660 ml   Filed Weights   06/21/22 0840 06/26/22 0500 06/27/22 0450  Weight: 124.3 kg 126 kg 127.3 kg    Data Reviewed: I have personally reviewed and interpreted daily labs, tele strips, imagings as discussed above. I reviewed all nursing notes, pharmacy notes, vitals, pertinent old records I have discussed plan of care as described above with RN and patient/family.  CBC: Recent Labs  Lab 06/21/22 0830 06/22/22 0233 06/23/22 0500 06/24/22 0326 06/25/22 0354 06/26/22 0356 06/27/22 0419  WBC 10.9*   < > 4.5 5.0 4.8 3.6* 4.0  NEUTROABS 11.1*  --   --   --   --   --   --   HGB 12.3*   < > 10.5* 10.3* 10.4* 10.3* 10.2*  HCT 41.9   < > 35.7* 35.3* 34.1* 34.4* 33.8*  MCV 78.3*   < > 78.8* 78.8* 76.6* 76.6* 75.8*  PLT 142*   < > 91* 95* 102* 105* 121*   < > = values in this interval not displayed.   Basic Metabolic Panel: Recent Labs  Lab 06/22/22 0230 06/22/22 0233 06/23/22 0500 06/24/22 0326 06/24/22 1839 06/25/22 0354  06/26/22 0356 06/27/22 0419  NA  --    < > 139 139 134* 139 137 140  K  --    < > 3.5 3.5 3.5 3.1* 3.7 3.5  CL  --    < > 101 100 98 102 100 103  CO2  --    < > 29 30 26 28 27 26   GLUCOSE  --    < > 125* 156* 288* 105* 139* 172*  BUN  --    < > 63* 73* 85* 82* 96* 100*  CREATININE  --    < > 2.36* 2.61* 3.07* 3.16* 3.84* 3.86*  CALCIUM  --    < > 9.0 9.0 8.9 9.1 8.9 9.1  MG 2.0  --  2.3 2.2  --  2.3  --   --   PHOS 2.8  --  4.4 3.1  --  3.1  --   --    < > = values in this interval not displayed.    Studies: No results found.  Scheduled Meds:  apixaban  5 mg Oral BID   buPROPion  150 mg Oral BID   insulin aspart  0-15 Units Subcutaneous Q4H   iron polysaccharides  150 mg Oral Daily   montelukast  10 mg Oral QHS   mycophenolate  360 mg Oral BID   pantoprazole  40 mg Oral QHS    predniSONE  5 mg Oral Daily   rosuvastatin  10 mg Oral QHS   saccharomyces boulardii  250 mg Oral BID   tacrolimus  2 mg Oral BID   tamsulosin  0.4 mg Oral QPC breakfast   Continuous Infusions:  ceFEPime (MAXIPIME) IV 2 g (06/26/22 2208)   PRN Meds: acetaminophen, alum & mag hydroxide-simeth, budesonide (PULMICORT) nebulizer solution, hydrALAZINE **OR** hydrALAZINE, hydrOXYzine, ipratropium-albuterol, methocarbamol, ondansetron **OR** ondansetron (ZOFRAN) IV  Time spent: 35 minutes  Author: Val Riles. MD [facility] 06/27/2022 3:32 PM  To reach On-call, see care teams to locate the attending and reach out to them via [email or web address]. If 7PM-7AM, please contact night-coverage If you still have difficulty reaching the attending provider, please page the [facility] (Director on Call) for [facility] on amion for assistance.

## 2022-06-27 NOTE — Progress Notes (Signed)
Date of Admission:  06/21/2022      ID: [name]. is a 62 y.o. male  Principal Problem:   Pyelonephritis Active Problems:   CAD (coronary artery disease)   Asthma, persistent controlled   Renal transplant recipient   Essential hypertension   Chronic combined systolic and diastolic CHF (congestive heart failure)   Pulmonary hypertension due to left heart disease   GERD (gastroesophageal reflux disease)   Type II diabetes mellitus with renal manifestations   Sepsis   Acute respiratory failure with hypoxia   Ischemic cardiomyopathy   Permanent atrial fibrillation   Bacteremia due to Gram-negative bacteria   Serratia infection   Complicated UTI (urinary tract infection)    Subjective: Doing well Took a shower  Medications:   apixaban  5 mg Oral BID   buPROPion  150 mg Oral BID   insulin aspart  0-15 Units Subcutaneous Q4H   iron polysaccharides  150 mg Oral Daily   montelukast  10 mg Oral QHS   mycophenolate  360 mg Oral BID   pantoprazole  40 mg Oral QHS   predniSONE  5 mg Oral Daily   rosuvastatin  10 mg Oral QHS   saccharomyces boulardii  250 mg Oral BID   tacrolimus  2 mg Oral BID   tamsulosin  0.4 mg Oral QPC breakfast    Objective: Vital signs in last 24 hours: Patient Vitals for the past 24 hrs:  BP Temp Temp src Pulse Resp SpO2 Weight  06/27/22 0752 (!) 148/76 98.1 F (36.7 C) Oral 86 18 96 % --  06/27/22 0450 (!) 164/86 -- -- 64 16 99 % 127.3 kg  06/26/22 2201 (!) 142/84 97.8 F (36.6 C) Oral 81 18 97 % --  06/26/22 1555 (!) 155/98 97.8 F (36.6 C) Oral 76 20 96 % --      PHYSICAL EXAM:  General: Alert, cooperative, no distress, appears stated age.  Lungs:b/l air entry Few basal crepts Heart: Regular rate and rhythm, no murmur, rub or gallop. Abdomen: Soft, non-tender,not distended. Bowel sounds normal. No masses Extremities:edema legs with venous stasis and pigmentation Skin: No rashes or lesions. Or bruising Lymph: Cervical,  supraclavicular normal. Neurologic: Grossly non-focal  Lab Results    Latest Ref Rng & Units 06/27/2022    4:19 AM 06/26/2022    3:56 AM 06/25/2022    3:54 AM  CBC  WBC 4.0 - 10.5 K/uL 4.0  3.6  4.8   Hemoglobin 13.0 - 17.0 g/dL 10.2  10.3  10.4   Hematocrit 39.0 - 52.0 % 33.8  34.4  34.1   Platelets 150 - 400 K/uL 121  105  102        Latest Ref Rng & Units 06/27/2022    4:19 AM 06/26/2022    3:56 AM 06/25/2022    3:54 AM  CMP  Glucose 70 - 99 mg/dL 172  139  105   BUN 8 - 23 mg/dL 100  96  82   Creatinine 0.61 - 1.24 mg/dL 3.86  3.84  3.16   Sodium 135 - 145 mmol/L 140  137  139   Potassium 3.5 - 5.1 mmol/L 3.5  3.7  3.1   Chloride 98 - 111 mmol/L 103  100  102   CO2 22 - 32 mmol/L 26  27  28    Calcium 8.9 - 10.3 mg/dL 9.1  8.9  9.1       Microbiology: BC- serratia Repeat culture neg  Assessment/Plan: serratia bacteremia Serratia UTI Complicated Uti with transplant kidney lower pole fullness pyelonephritis Also BPH with urolift with 6 implants in the prostate  post void bladder scan only 34 ml- normal  will need IV cefepime   ( may be ceftriaxone) for 2week. Pt would like to go one once a day antibiotic, He cannot get PICC because of AVG. So it would have to be a central line- if the antibiotic can be done peripherally he would prefer that. HE could go to day surgery and get it he prefers to come to day surgery   discussed with Dr.Stoioff regarding risk of prostate implants getting infected, they usually get epithelialized   Renal transplant- DDRT in 2012 on tacrolimus/mycophenolate   Worsening AKI Nephrology on board- thought to be due to over aggressive diuretics for chf Anemia   Afib   CHF   Pulmonary HTN   Gout   DM management as per primary team   HTN   CAD s/p CABG H/o gastric sleeve surgery  Discussed the management with patient and care team

## 2022-06-28 DIAGNOSIS — N12 Tubulo-interstitial nephritis, not specified as acute or chronic: Secondary | ICD-10-CM | POA: Diagnosis not present

## 2022-06-28 LAB — GLUCOSE, CAPILLARY
Glucose-Capillary: 70 mg/dL (ref 70–99)
Glucose-Capillary: 163 mg/dL — ABNORMAL HIGH (ref 70–99)
Glucose-Capillary: 145 mg/dL — ABNORMAL HIGH (ref 70–99)
Glucose-Capillary: 253 mg/dL — ABNORMAL HIGH (ref 70–99)

## 2022-06-28 LAB — CULTURE, BLOOD (ROUTINE X 2)
Culture: NO GROWTH
Culture: NO GROWTH

## 2022-06-28 LAB — CBC
HCT: 35.7 % — ABNORMAL LOW (ref 39.0–52.0)
Hemoglobin: 10.6 g/dL — ABNORMAL LOW (ref 13.0–17.0)
MCH: 22.8 pg — ABNORMAL LOW (ref 26.0–34.0)
MCHC: 29.7 g/dL — ABNORMAL LOW (ref 30.0–36.0)
MCV: 76.8 fL — ABNORMAL LOW (ref 80.0–100.0)
Platelets: 135 10*3/uL — ABNORMAL LOW (ref 150–400)
RBC: 4.65 MIL/uL (ref 4.22–5.81)
RDW: 18.8 % — ABNORMAL HIGH (ref 11.5–15.5)
WBC: 4.8 10*3/uL (ref 4.0–10.5)
nRBC: 0 % (ref 0.0–0.2)

## 2022-06-28 LAB — BASIC METABOLIC PANEL
Anion gap: 10 (ref 5–15)
BUN: 93 mg/dL — ABNORMAL HIGH (ref 8–23)
CO2: 27 mmol/L (ref 22–32)
Calcium: 9.2 mg/dL (ref 8.9–10.3)
Chloride: 105 mmol/L (ref 98–111)
Creatinine, Ser: 3.31 mg/dL — ABNORMAL HIGH (ref 0.61–1.24)
GFR, Estimated: 20 mL/min — ABNORMAL LOW (ref >60)
Glucose, Bld: 172 mg/dL — ABNORMAL HIGH (ref 70–99)
Potassium: 3.5 mmol/L (ref 3.5–5.1)
Sodium: 142 mmol/L (ref 135–145)

## 2022-06-28 MED ORDER — SODIUM CHLORIDE 0.9 % IV SOLN
1.0000 g | INTRAVENOUS | Status: DC
  Administered 2022-06-28: 1000 mg via INTRAVENOUS
  Filled 2022-06-28: qty 1

## 2022-06-28 MED ORDER — SODIUM CHLORIDE 0.9 % IV SOLN
2.0000 g | Freq: Two times a day (BID) | INTRAVENOUS | Status: DC
  Filled 2022-06-28: qty 12.5

## 2022-06-28 MED ORDER — SODIUM CHLORIDE 0.9 % IV SOLN: 1.0000 g | INTRAVENOUS | Status: AC

## 2022-06-28 MED ORDER — TORSEMIDE 20 MG PO TABS: 100.0000 mg | ORAL_TABLET | Freq: Every day | ORAL | Status: DC

## 2022-06-28 NOTE — Progress Notes (Signed)
PHARMACY CONSULT NOTE FOR:  OUTPATIENT  PARENTERAL ANTIBIOTIC THERAPY (OPAT) -  TO BE ADMINISTERED AT [facility] SAME DAY SURGERY   Indication: Serratia bacteremia Regimen: ertapenem 1gm IV q24h End date: 07/05/2022  Labs on Sun (4/7) and Wed (4/10): CBC/diff, CMP  Patient aware has appointment at Same Day surgery for antibiotic administration at [facility] Saturday April 5th.  He knows to report to registration desk on 1st floor on weekends (as no one will be and 2nd floor desk).    IV antibiotic discharge orders are pended. To discharging provider:  please sign these orders via discharge navigator,  Select New Orders & click on the button choice - Manage This Unsigned Work.     Thank you for allowing pharmacy to be a part of this patient's care.  Juliette Alcide, PharmD, BCPS, BCIDP Work Cell: [phone] 06/28/2022 12:22 PM

## 2022-06-28 NOTE — Progress Notes (Signed)
[facility]  ROUNDING NOTE   Subjective:   [name] presents with shortness of breath, dyspnea on exertion, intermittent fevers and gross hematuria. Patient states he has been taking all his medications including apixaban and his immunomodulating agents: tacrolimus, mycophenolate and prednisone.    Changed to ertapenem as per Infectious Disease  Patient wants to go home.   Creatinine 3.3 (3.86) UOP .   Objective:  Vital signs in last 24 hours:  Temp:  [97.4 F (36.3 C)-98.2 F (36.8 C)] 98 F (36.7 C) (04/05 0353) Pulse Rate:  [65-75] 67 (04/05 0353) Resp:  [16-18] 18 (04/05 0353) BP: (151-170)/(81-108) 157/81 (04/05 0353) SpO2:  [90 %-97 %] 97 % (04/05 0353) Weight:  [124.5 kg] 124.5 kg (04/05 0500)  Weight change: -2.8 kg Filed Weights   06/26/22 0500 06/27/22 0450 06/28/22 0500  Weight: 126 kg 127.3 kg 124.5 kg    Intake/Output: I/O last 3 completed shifts: In: 1640 [P.O.:1440; IV Piggyback:200] Out: 2700 [Urine:2700]   Intake/Output this shift:  Total I/O In: 240 [P.O.:240] Out: -   Physical Exam: General: NAD, sitting in chair  Head: Normocephalic, atraumatic. Moist oral mucosal membranes  Eyes: Anicteric  Lungs:  Clear to auscultation, room air  Heart: Irregular  Abdomen:  Obese, LLQ allograft kidney - no tenderness  Extremities:  1+ peripheral edema.  Neurologic: Alert and oriented, moving all four extremities  Skin: No lesions  Access Left upper AVF    Basic Metabolic Panel: Recent Labs  Lab 06/22/22 0230 06/22/22 0233 06/23/22 0500 06/24/22 0326 06/24/22 1839 06/25/22 0354 06/26/22 0356 06/27/22 0419 06/28/22 0605  NA  --    < > 139 139 134* 139 137 140 142  K  --    < > 3.5 3.5 3.5 3.1* 3.7 3.5 3.5  CL  --    < > 101 100 98 102 100 103 105  CO2  --    < > GLUCOSE  --    < > 125* 156* 288* 105* 139* 172* 172*  BUN  --    < > 63* 73* 85* 82* 96* 100* 93*  CREATININE  --    < >  2.36* 2.61* 3.07* 3.16* 3.84* 3.86* 3.31*  CALCIUM  --    < > 9.0 9.0 8.9 9.1 8.9 9.1 9.2  MG 2.0  --  2.3 2.2  --  2.3  --   --   --   PHOS 2.8  --  4.4 3.1  --  3.1  --   --   --    < > = values in this interval not displayed.     Liver Function Tests: Recent Labs  Lab 06/22/22 0233  AST 27  ALT 14  ALKPHOS 91  BILITOT 1.6*  PROT 6.5  ALBUMIN 2.9*    No results for input(s): "LIPASE", "AMYLASE" in the last 168 hours. No results for input(s): "AMMONIA" in the last 168 hours.   CBC: Recent Labs  Lab 06/24/22 0326 06/25/22 0354 06/26/22 0356 06/27/22 0419 06/28/22 0605  WBC 5.0 4.8 3.6* 4.0 4.8  HGB 10.3* 10.4* 10.3* 10.2* 10.6*  HCT 35.3* 34.1* 34.4* 33.8* 35.7*  MCV 78.8* 76.6* 76.6* 75.8* 76.8*  PLT 95* 102* 105* 121* 135*     Cardiac Enzymes: No results for input(s): "CKTOTAL", "CKMB", "CKMBINDEX", "TROPONINI" in the last 168 hours.   BNP: Invalid input(s): "POCBNP"  CBG: Recent Labs  Lab 06/27/22 2329  06/28/22 0342 06/28/22 0433 06/28/22 0822 06/28/22 1208  GLUCAP 201* 70 163* 145* 253*     Microbiology: Results for orders placed or performed during the hospital encounter of 06/21/22  Blood culture (routine x 2)     Status: Abnormal   Collection Time: 06/21/22  8:38 AM   Specimen: BLOOD  Result Value Ref Range Status   Specimen Description   Final    BLOOD RIGHT ARM Performed at [facility], [address] [place], [place] [address]    Special Requests   Final    NONE Performed at [facility], [address] [place], [place] [address]    Culture  Setup Time   Final    Organism ID to follow GRAM NEGATIVE RODS AEROBIC BOTTLE ONLY CRITICAL RESULT CALLED TO, READ BACK BY AND VERIFIED WITH: RAQUEL RODRIGUEZ GUZMAN AT 1422 06/22/22.PMF  REVIEWED BY A. LAFRANCE Performed at [facility], [address] [place], [place] [address]    Culture SERRATIA MARCESCENS (A)  Final   Report Status 06/28/2022 FINAL  Final    Organism ID, Bacteria SERRATIA MARCESCENS  Final      Susceptibility   Serratia marcescens - MIC*    CEFEPIME <=0.12 SENSITIVE Sensitive     CEFTAZIDIME <=1 SENSITIVE Sensitive     CEFTRIAXONE <=0.25 SENSITIVE Sensitive     CIPROFLOXACIN 0.5 INTERMEDIATE Intermediate     GENTAMICIN <=1 SENSITIVE Sensitive     TRIMETH/SULFA >=320 RESISTANT Resistant     ERTAPENEM Value in next row Sensitive      SENSITIVE0.12    * SERRATIA MARCESCENS  Blood Culture ID Panel (Reflexed)     Status: Abnormal   Collection Time: 06/21/22  8:38 AM  Result Value Ref Range Status   Enterococcus faecalis NOT DETECTED NOT DETECTED Final   Enterococcus Faecium NOT DETECTED NOT DETECTED Final   Listeria monocytogenes NOT DETECTED NOT DETECTED Final   Staphylococcus species NOT DETECTED NOT DETECTED Final   Staphylococcus aureus (BCID) NOT DETECTED NOT DETECTED Final   Staphylococcus epidermidis NOT DETECTED NOT DETECTED Final   Staphylococcus lugdunensis NOT DETECTED NOT DETECTED Final   Streptococcus species NOT DETECTED NOT DETECTED Final   Streptococcus agalactiae NOT DETECTED NOT DETECTED Final   Streptococcus pneumoniae NOT DETECTED NOT DETECTED Final   Streptococcus pyogenes NOT DETECTED NOT DETECTED Final   A.calcoaceticus-baumannii NOT DETECTED NOT DETECTED Final   Bacteroides fragilis NOT DETECTED NOT DETECTED Final   Enterobacterales DETECTED (A) NOT DETECTED Final    Comment: Enterobacterales represent a large order of gram negative bacteria, not a single organism. CRITICAL RESULT CALLED TO, READ BACK BY AND VERIFIED WITH: RAQUEL RODRIGUEZ GUZMAN AT 1422 06/22/22.PMF    Enterobacter cloacae complex NOT DETECTED NOT DETECTED Final   Escherichia coli NOT DETECTED NOT DETECTED Final   Klebsiella aerogenes NOT DETECTED NOT DETECTED Final   Klebsiella oxytoca NOT DETECTED NOT DETECTED Final   Klebsiella pneumoniae NOT DETECTED NOT DETECTED Final   Proteus species NOT DETECTED NOT DETECTED Final    Salmonella species NOT DETECTED NOT DETECTED Final   Serratia marcescens DETECTED (A) NOT DETECTED Final    Comment: CRITICAL RESULT CALLED TO, READ BACK BY AND VERIFIED WITH: RAQUEL RODRIGUEZ GUZMAN AT 1422 06/22/22.PMF    Haemophilus influenzae NOT DETECTED NOT DETECTED Final   Neisseria meningitidis NOT DETECTED NOT DETECTED Final   Pseudomonas aeruginosa NOT DETECTED NOT DETECTED Final   Stenotrophomonas maltophilia NOT DETECTED NOT DETECTED Final   Candida albicans NOT DETECTED NOT DETECTED  Final   Candida auris NOT DETECTED NOT DETECTED Final   Candida glabrata NOT DETECTED NOT DETECTED Final   Candida krusei NOT DETECTED NOT DETECTED Final   Candida parapsilosis NOT DETECTED NOT DETECTED Final   Candida tropicalis NOT DETECTED NOT DETECTED Final   Cryptococcus neoformans/gattii NOT DETECTED NOT DETECTED Final   CTX-M ESBL NOT DETECTED NOT DETECTED Final   Carbapenem resistance IMP NOT DETECTED NOT DETECTED Final   Carbapenem resistance KPC NOT DETECTED NOT DETECTED Final   Carbapenem resistance NDM NOT DETECTED NOT DETECTED Final   Carbapenem resist OXA 48 LIKE NOT DETECTED NOT DETECTED Final   Carbapenem resistance VIM NOT DETECTED NOT DETECTED Final    Comment: Performed at [facility], [address], [place], [place] [address]  Urine Culture     Status: Abnormal   Collection Time: 06/21/22  8:48 AM   Specimen: Urine, Clean Catch  Result Value Ref Range Status   Specimen Description   Final    URINE, CLEAN CATCH Performed at [facility], [address] [place], [place] [address]    Special Requests   Final    NONE Performed at [facility], [address] [place], [place] [address]    Culture >=100,000 COLONIES/mL SERRATIA MARCESCENS (A)  Final   Report Status 06/23/2022 FINAL  Final   Organism ID, Bacteria SERRATIA MARCESCENS (A)  Final      Susceptibility   Serratia marcescens - MIC*    CEFEPIME <=0.12 SENSITIVE Sensitive      CEFTRIAXONE <=0.25 SENSITIVE Sensitive     CIPROFLOXACIN 0.5 INTERMEDIATE Intermediate     GENTAMICIN <=1 SENSITIVE Sensitive     NITROFURANTOIN 256 RESISTANT Resistant     TRIMETH/SULFA >=320 RESISTANT Resistant     * >=100,000 COLONIES/mL SERRATIA MARCESCENS  Resp panel by RT-PCR (RSV, Flu A&B, Covid) Anterior Nasal Swab     Status: None   Collection Time: 06/21/22  8:48 AM   Specimen: Anterior Nasal Swab  Result Value Ref Range Status   SARS Coronavirus 2 by RT PCR NEGATIVE NEGATIVE Final    Comment: (NOTE) SARS-CoV-2 target nucleic acids are NOT DETECTED.  The SARS-CoV-2 RNA is generally detectable in upper respiratory specimens during the acute phase of infection. The lowest concentration of SARS-CoV-2 viral copies this assay can detect is 138 copies/mL. A negative result does not preclude SARS-Cov-2 infection and should not be used as the sole basis for treatment or other patient management decisions. A negative result may occur with  improper specimen collection/handling, submission of specimen other than nasopharyngeal swab, presence of viral mutation(s) within the areas targeted by this assay, and inadequate number of viral copies(<138 copies/mL). A negative result must be combined with clinical observations, patient history, and epidemiological information. The expected result is Negative.  Fact Sheet for Patients:  BloggerCourse.com  Fact Sheet for Healthcare Providers:  SeriousBroker.it  This test is no t yet approved or cleared by the [place] FDA and  has been authorized for detection and/or diagnosis of SARS-CoV-2 by FDA under an Emergency Use Authorization (EUA). This EUA will remain  in effect (meaning this test can be used) for the duration of the COVID-19 declaration under Section 564(b)(1) of the Act, 21 U.S.C.section 360bbb-3(b)(1), unless the authorization is terminated  or revoked sooner.        Influenza A by PCR NEGATIVE NEGATIVE Final   Influenza B by PCR NEGATIVE NEGATIVE Final    Comment: (NOTE) The Xpert Xpress SARS-CoV-2/FLU/RSV plus assay is  intended as an aid in the diagnosis of influenza from Nasopharyngeal swab specimens and should not be used as a sole basis for treatment. Nasal washings and aspirates are unacceptable for Xpert Xpress SARS-CoV-2/FLU/RSV testing.  Fact Sheet for Patients: BloggerCourse.comhttps://www.fda.gov/media/152166/download  Fact Sheet for Healthcare Providers: SeriousBroker.ithttps://www.fda.gov/media/152162/download  This test is not yet approved or cleared by the [place] FDA and has been authorized for detection and/or diagnosis of SARS-CoV-2 by FDA under an Emergency Use Authorization (EUA). This EUA will remain in effect (meaning this test can be used) for the duration of the COVID-19 declaration under Section 564(b)(1) of the Act, 21 U.S.C. section 360bbb-3(b)(1), unless the authorization is terminated or revoked.     Resp Syncytial Virus by PCR NEGATIVE NEGATIVE Final    Comment: (NOTE) Fact Sheet for Patients: BloggerCourse.comhttps://www.fda.gov/media/152166/download  Fact Sheet for Healthcare Providers: SeriousBroker.ithttps://www.fda.gov/media/152162/download  This test is not yet approved or cleared by the [place] FDA and has been authorized for detection and/or diagnosis of SARS-CoV-2 by FDA under an Emergency Use Authorization (EUA). This EUA will remain in effect (meaning this test can be used) for the duration of the COVID-19 declaration under Section 564(b)(1) of the Act, 21 U.S.C. section 360bbb-3(b)(1), unless the authorization is terminated or revoked.  Performed at [facility], [address] [place], [place] [address]   Culture, blood (x 2)     Status: None   Collection Time: 06/21/22  1:20 PM   Specimen: BLOOD  Result Value Ref Range Status   Specimen Description BLOOD BLOOD RIGHT ARM  Final   Special Requests   Final    BOTTLES DRAWN AEROBIC  AND ANAEROBIC Blood Culture adequate volume   Culture   Final    NO GROWTH 5 DAYS Performed at [facility], [address] [place], [place] [address]    Report Status 06/26/2022 FINAL  Final  Culture, blood (x 2)     Status: None   Collection Time: 06/21/22  1:20 PM   Specimen: BLOOD  Result Value Ref Range Status   Specimen Description BLOOD BLOOD RIGHT HAND  Final   Special Requests   Final    BOTTLES DRAWN AEROBIC AND ANAEROBIC Blood Culture results may not be optimal due to an excessive volume of blood received in culture bottles   Culture   Final    NO GROWTH 5 DAYS Performed at [facility], [address] [place], [place] [address]    Report Status 06/26/2022 FINAL  Final  MRSA Next Gen by PCR, Nasal     Status: None   Collection Time: 06/21/22  1:57 PM   Specimen: Nasal Mucosa; Nasal Swab  Result Value Ref Range Status   MRSA by PCR Next Gen NOT DETECTED NOT DETECTED Final    Comment: (NOTE) The GeneXpert MRSA Assay (FDA approved for NASAL specimens only), is one component of a comprehensive MRSA colonization surveillance program. It is not intended to diagnose MRSA infection nor to guide or monitor treatment for MRSA infections. Test performance is not FDA approved in patients less than 63 years old. Performed at [facility], [address] [place], [place] [address]   Culture, blood (Routine X 2) w Reflex to ID Panel     Status: None   Collection Time: 06/23/22  5:00 AM   Specimen: BLOOD  Result Value Ref Range Status   Specimen Description BLOOD BLOOD RIGHT ARM  Final   Special Requests   Final    BOTTLES DRAWN AEROBIC AND ANAEROBIC  Blood Culture results may not be optimal due to an excessive volume of blood received in culture bottles   Culture   Final    NO GROWTH 5 DAYS Performed at [facility], [address] [place], [place] [address]    Report Status 06/28/2022 FINAL  Final  Culture, blood (Routine X 2) w  Reflex to ID Panel     Status: None   Collection Time: 06/23/22  5:14 AM   Specimen: BLOOD  Result Value Ref Range Status   Specimen Description BLOOD BLOOD RIGHT HAND  Final   Special Requests   Final    BOTTLES DRAWN AEROBIC AND ANAEROBIC Blood Culture results may not be optimal due to an excessive volume of blood received in culture bottles   Culture   Final    NO GROWTH 5 DAYS Performed at [facility], [address] [place], [place] [address]    Report Status 06/28/2022 FINAL  Final   *Note: Due to a large number of results and/or encounters for the requested time period, some results have not been displayed. A complete set of results can be found in Results Review.    Coagulation Studies: No results for input(s): "LABPROT", "INR" in the last 72 hours.   Urinalysis: No results for input(s): "COLORURINE", "LABSPEC", "PHURINE", "GLUCOSEU", "HGBUR", "BILIRUBINUR", "KETONESUR", "PROTEINUR", "UROBILINOGEN", "NITRITE", "LEUKOCYTESUR" in the last 72 hours.  Invalid input(s): "APPERANCEUR"     Imaging: No results found.   Medications:    ertapenem 1,000 mg (06/28/22 1129)    apixaban  5 mg Oral BID   buPROPion  150 mg Oral BID   insulin aspart  0-15 Units Subcutaneous Q4H   iron polysaccharides  150 mg Oral Daily   montelukast  10 mg Oral QHS   mycophenolate  360 mg Oral BID   pantoprazole  40 mg Oral QHS   predniSONE  5 mg Oral Daily   rosuvastatin  10 mg Oral QHS   saccharomyces boulardii  250 mg Oral BID   tacrolimus  2 mg Oral BID   tamsulosin  0.4 mg Oral QPC breakfast   [START ON 06/29/2022] torsemide  100 mg Oral Daily   acetaminophen, alum & mag hydroxide-simeth, budesonide (PULMICORT) nebulizer solution, hydrALAZINE **OR** hydrALAZINE, hydrOXYzine, ipratropium-albuterol, methocarbamol, ondansetron **OR** ondansetron (ZOFRAN) IV  Assessment/ Plan:  [name] is a 62 y.o. Hispanic male renal transplant in 2012, coronary artery  disease status post CABG, atrial fibrillation, congestive heart failure, gout, GERD, hyperlipidemia, pulmonary hypertension, status post gastric bypass, diabetes mellitus type II, who was admitted to [facility] on 06/21/2022 for Pyelonephritis [N12] Acute respiratory failure with hypoxia [J96.01] Sepsis [A41.9] Sepsis, due to unspecified organism, unspecified whether acute organ dysfunction present [A41.9]    Acute kidney injury on chronic kidney disease stage IIIA-T: baseline creatinine of 1.63, GFR of 47 on 05/30/22. Followed by [facility]. Transplant doppler reviewed with patient.  -Tacrolimus level 4.2. Continue tacrolimus 2g PO bid.  - continue mycophenolate and prednisone - Continue PJP prophylaxis with Bactrim. -Creatinine remains elevated but stable.  Hold diuretics today. Restart torsemide 100mg  tomorrow.  - May restart dapagliflozin on discharge.  - metolazone as per cardiology instructions.  - Follow up with [facility] in 1-2 weeks.   Pyelonephritis/urinary tract infection/sepsis: cultures growing serratia marcences in urine and blood from 3/29. Appreciate Infectious Disease input.    Hypertension with chronic kidney disease: blood pressure elevated. Volume driven. Restart home regimen tomorrow.    Diabetes mellitus  type II with chronic kidney disease: insulin dependent. Hemoglobin A1c of 7.7% on 05/15/22. Holding dapagliflozin.    LOS: 7 Schuyler Olden 4/5/20241:18 PM

## 2022-06-28 NOTE — Progress Notes (Signed)
Discharge instructions provided to patient. All medications, follow up appointments, and discharge instructions provided. IV out. Monitor off CCMD notified.

## 2022-06-28 NOTE — Treatment Plan (Signed)
Diagnosis: Serratia bacteremia and complicated uti Baseline Creatinine 3    Allergies  Allergen Reactions   Contrast Media [Iodinated Contrast Media] Other (See Comments)    Kidney transplant   Iodine Other (See Comments)    Other reaction(s): Other (See Comments) Kidney transplant   Ibuprofen Other (See Comments)    Due to kidney transplant Due to kidney transplant Due to kidney transplant    OPAT Orders Discharge antibiotics: Ertapenem 1 gram Iv every day by peripheral line until 07/05/22 End Date:   Labs on Sunday and Wednesday  while on IV antibiotics: _X_ CBC with differential  _X_ CMP    Fax weekly lab results  promptly to [phone]  Clinic Follow Up Appt:not needed with Dr.Neftali Thurow Follow up with urology  Call [phone] with any concerns

## 2022-06-28 NOTE — TOC Transition Note (Signed)
Transition of Care [facility]) - CM/SW Discharge Note   Patient Details  Name: [name]. MRN: [id number] Date of Birth: 31-Aug-1960  [facility] CM/SW Contact:  Chapman Fitch, RN Phone Number: 06/28/2022, 2:25 PM   Clinical Narrative:     Per ID they have arranged outpatient IV antibiotics         Patient Goals and CMS Choice      Discharge Placement                         Discharge Plan and Services Additional resources added to the After Visit Summary for                                       Social Determinants of Health (SDOH) Interventions SDOH Screenings   Food Insecurity: No Food Insecurity (04/01/2022)  Housing: Low Risk  (04/01/2022)  Transportation Needs: No Transportation Needs (04/01/2022)  Utilities: Not At Risk (04/01/2022)  Depression (PHQ2-9): Low Risk  (10/30/2021)  Tobacco Use: Medium Risk (06/21/2022)     Readmission Risk Interventions    06/24/2022    9:39 AM 11/04/2021   11:44 AM 05/15/2020    2:49 PM  Readmission Risk Prevention Plan  Transportation Screening Complete Complete Complete  PCP or Specialist Appt within 3-5 Days   Complete  HRI or Home Care Consult   Complete  Social Work Consult for Recovery Care Planning/Counseling Complete  Complete  Palliative Care Screening Not Applicable  Not Applicable  Medication Review Oceanographer) Complete Complete Complete  PCP or Specialist appointment within 3-5 days of discharge  Complete   HRI or Home Care Consult  Complete   SW Recovery Care/Counseling Consult  Complete   Palliative Care Screening  Not Applicable   Skilled Nursing Facility  Not Applicable

## 2022-06-28 NOTE — Progress Notes (Signed)
Advanced Heart Failure Rounding Note   Subjective:    Feels great. Seems to be auto-diuresing. Weight down 6 pounds but says it was a bed weight.   Denies CP or SOB. Says he wants to go home.   SCr improving  Objective:   Weight Range:  Vital Signs:   Temp:  [97.4 F (36.3 C)-98.2 F (36.8 C)] 98 F (36.7 C) (04/05 0353) Pulse Rate:  [65-88] 67 (04/05 0353) Resp:  [16-18] 18 (04/05 0353) BP: (136-170)/(80-108) 157/81 (04/05 0353) SpO2:  [90 %-98 %] 97 % (04/05 0353) Weight:  [124.5 kg] 124.5 kg (04/05 0500) Last BM Date : 06/27/22  Weight change: Filed Weights   06/26/22 0500 06/27/22 0450 06/28/22 0500  Weight: 126 kg 127.3 kg 124.5 kg    Intake/Output:   Intake/Output Summary (Last 24 hours) at 06/28/2022 0937 Last data filed at 06/28/2022 0600 Gross per 24 hour  Intake 800 ml  Output 1400 ml  Net -600 ml      Physical Exam: General: Sitting up in chair No resp difficulty HEENT: normal Neck: supple. JVP 8-9 Carotids 2+ bilat; no bruits. No lymphadenopathy or thryomegaly appreciated. Cor: PMI nondisplaced. Irregular rate & rhythm. No rubs, gallops or murmurs. Lungs: clear Abdomen: soft, nontender, nondistended. No hepatosplenomegaly. No bruits or masses. Good bowel sounds. Extremities: no cyanosis, clubbing, rash, trace edema Neuro: alert & orientedx3, cranial nerves grossly intact. moves all 4 extremities w/o difficulty. Affect pleasant   Telemetry: AF 60-70s Personally reviewed   Labs: Basic Metabolic Panel: Recent Labs  Lab 06/22/22 0230 06/22/22 0233 06/23/22 0500 06/24/22 0326 06/24/22 1839 06/25/22 0354 06/26/22 0356 06/27/22 0419 06/28/22 0605  NA  --    < > 139 139 134* 139 137 140 142  K  --    < > 3.5 3.5 3.5 3.1* 3.7 3.5 3.5  CL  --    < > 101 100 98 102 100 103 105  CO2  --    < > 29 30 26 28 27 26 27   GLUCOSE  --    < > 125* 156* 288* 105* 139* 172* 172*  BUN  --    < > 63* 73* 85* 82* 96* 100* 93*  CREATININE  --    < >  2.36* 2.61* 3.07* 3.16* 3.84* 3.86* 3.31*  CALCIUM  --    < > 9.0 9.0 8.9 9.1 8.9 9.1 9.2  MG 2.0  --  2.3 2.2  --  2.3  --   --   --   PHOS 2.8  --  4.4 3.1  --  3.1  --   --   --    < > = values in this interval not displayed.     Liver Function Tests: Recent Labs  Lab 06/22/22 0233  AST 27  ALT 14  ALKPHOS 91  BILITOT 1.6*  PROT 6.5  ALBUMIN 2.9*    No results for input(s): "LIPASE", "AMYLASE" in the last 168 hours. Recent Labs  Lab 06/21/22 1119  AMMONIA 19     CBC: Recent Labs  Lab 06/24/22 0326 06/25/22 0354 06/26/22 0356 06/27/22 0419 06/28/22 0605  WBC 5.0 4.8 3.6* 4.0 4.8  HGB 10.3* 10.4* 10.3* 10.2* 10.6*  HCT 35.3* 34.1* 34.4* 33.8* 35.7*  MCV 78.8* 76.6* 76.6* 75.8* 76.8*  PLT 95* 102* 105* 121* 135*     Cardiac Enzymes: No results for input(s): "CKTOTAL", "CKMB", "CKMBINDEX", "TROPONINI" in the last 168 hours.   BNP: BNP (last 3  results) Recent Labs    05/09/22 1218 05/30/22 1149 06/21/22 0848  BNP 352.3* 430.9* 465.0*     ProBNP (last 3 results) No results for input(s): "PROBNP" in the last 8760 hours.    Other results:  Imaging: No results found.   Medications:     Scheduled Medications:  apixaban  5 mg Oral BID   buPROPion  150 mg Oral BID   insulin aspart  0-15 Units Subcutaneous Q4H   iron polysaccharides  150 mg Oral Daily   montelukast  10 mg Oral QHS   mycophenolate  360 mg Oral BID   pantoprazole  40 mg Oral QHS   predniSONE  5 mg Oral Daily   rosuvastatin  10 mg Oral QHS   saccharomyces boulardii  250 mg Oral BID   tacrolimus  2 mg Oral BID   tamsulosin  0.4 mg Oral QPC breakfast    Infusions:  ertapenem      PRN Medications: acetaminophen, alum & mag hydroxide-simeth, budesonide (PULMICORT) nebulizer solution, hydrALAZINE **OR** hydrALAZINE, hydrOXYzine, ipratropium-albuterol, methocarbamol, ondansetron **OR** ondansetron (ZOFRAN) IV   Assessment/Plan:    1.  Pyelonephritis/serratia sepsis -On  antibiotics as per primary team   2.  Acute on chronic systolic HF - Echo EF 40 to 45% - Diuretics on hold due to AKI.  - Volume status looks great.  - HF will sign. - Will defer diuretics dosing to Renal at this point in setting of transplanted kidney and CKD IV  3.  CAD/CABG -Eliquis, Crestor - No s/s angina  4.  Permanent A-fib - Rate controlled HR 70-80s -Continue Eliquis -History of bradycardia with beta-blockers. - No change   5. ESRD s/p renal transplant with subsequent AKI on CKD 3B-IV - Baseline Scr 2.5-2.7 - Scr improving  - Suspect pre-renal.    - As above, ok for d/c from HF standpoint. Diuretic dosing per renal.   We will arrange outpatient f/u in HF Clinic.    Length of Stay: 7   Arvilla Meres MD 06/28/2022, 9:37 AM  Advanced Heart Failure Team Pager [phone] (M-F; 7a - 4p)  Please contact [facility] for night-coverage after hours (4p -7a ) and weekends on [email or web address]

## 2022-06-28 NOTE — Discharge Summary (Signed)
[facility] Discharge Summary   Patient: [name]. KVQ:[id number]  PCP: [name], MD  Date of admission: 06/21/2022   Date of discharge: 06/28/2022 06/28/2022     Discharge Diagnoses:  Principal Problem:   Pyelonephritis Active Problems:   Sepsis   CAD (coronary artery disease)   Renal transplant recipient   Chronic combined systolic and diastolic CHF (congestive heart failure)   Essential hypertension   Asthma, persistent controlled   Type II diabetes mellitus with renal manifestations   Pulmonary hypertension due to left heart disease   GERD (gastroesophageal reflux disease)   Acute respiratory failure with hypoxia   Ischemic cardiomyopathy   Permanent atrial fibrillation   Bacteremia due to Gram-negative bacteria   Serratia infection   Complicated UTI (urinary tract infection)   Admitted From: Home Disposition:  Home   Recommendations for Outpatient Follow-up:  Follow-up with PCP in 1 week, repeat BMP after 1 week Follow-up with nephrology in 1 week Follow-up with ID and continue IV antibiotics daily till 07/05/2022. Follow-up with cardiology as per schedule Follow up LABS/TEST:  as above   Diet recommendation: Cardiac and Carb modified diet  Activity: The patient is advised to gradually reintroduce usual activities, as tolerated  Discharge Condition: stable  Code Status: Full code   History of present illness: As per the H and P dictated on admission  Hospital Course:  [name]. is a 62 y.o. male with PMH of CAD s/p CABG in 2005, ischemic cardiomyopathy LVEF 40 to 45% as per echo Jan 2024, pulmonary hypertension, chronic A. Fib on Eliquis, aortic stenosis, HTN, type 2 DM, ESRD s/p renal transplant in 2012 now with stage III CKD, OSA on CPAP, gout presenting with sepsis, pyelonephritis.  ED w/up: Tmax 101.5, HR 100s, BP 80s to 100s over 50s, on 2 L nasal cannula to keep O2 sats greater than 92%.  Wbx 10.9, Plts 142, LA  2.1---1.8, roponin 90s to 120s, COVID flu and RSV negative. BNP 465. Creatinine 2.2.  CT head grossly stable. CT ch/abd/p: hydronephrosis of transplanted kidney. Noted 2.9 x 1 cm nodular Consolidative opacity in the right lower lobe present since 2021. Changes in liver consistent with cirrhosis splenomegaly with some concern for portal vein hypertension.    Assessment and Plan: # Sepsis, Serratia bacteremia 2/2 pyelonephritis and possible atypical pneumonia Sepsis criteria Tmax 101.5, heart rate 100, hypotensive, WBC elevated, pyelonephritis, lactic acid 2.1, hypoxic respiratory failure. S/p IV fluid, DC'd due to pulmonary edema and respiratory failure.  Sepsis and respiratory failure resolved. # Pyelonephritis Baseline history of end-stage renal disease status post renal transplant on chronic immunosuppression. Tacrolimus trough level 4.2 wnl. Continue tacrolimus 2g PO bid.  Continue mycophenolate and prednisone. S/p PJP prophylaxis with Bactrim, stopped on 4/3 due to elevated Cr level. S/p vancomycin and Flagyl. S/p cefepime 2 g every 12 hourly. Urine cx: Serratia marcescens. Blood culture: Serratia marcescens, sensitive to cefepime. Repeat blood cultures NGTD. Consulted ID, recommended ertapenem IV daily till 07/05/2022 via peripheral IV as an outpatient.   # AKI, ESRD s/p renal transplant. Baseline creatinine 1.63, Cr 3.16 most likely due to excessive diuresis, Cr 3.84 --3.86--3.12 started trending down.  Torsemide was held for 2 days and continued oral hydration.  Nephrology was following.  Recommended to resume torsemide 100 mg p.o. daily and potassium chloride 40 mEq p.o. daily on discharge.  Repeat BMP after 1 week and follow with nephrology as an outpatient.  # Acute hypoxic respiratory failure, multifactorial could be  due to pneumonia versus CHF. S/p  empiric azithromycin 500 mg IV daily x 5 days. S/p Lasix 60 mg x 1 dose, s/p torsemide stopped on 4/2 due to worsening renal functions.   Supplemental O2 admission has been weaned off.  Currently saturating well. # CAD (coronary artery disease) S/p CABG in 2005. No active chest pain though with troponins in the 90s on presentation. Suspect demand ischemia in the setting of sepsis and pyelonephritis. # A-fib with RVR on EKG, Continue Eliquis, cardiology was following.  # Chronic systolic congestive heart failure, TTE January 2024 with LVEF of 40-45%  January 2023 admission does mention episode of cardiorenal syndrome assd w/ diuresis  Noted to be followed by the heart failure clinic. Dry weight at hospital dc 03/2022 was 116kg, BNP 465 elevated. S/p Torsemide held due to AKI, On 4/1 Lasix 80 mg x 1 dose and metolazone 5 mg oral x 1 dose given. Cardiology was following # Hypokalemia due to diuresis, Potassium repleted cautiously due to renal failure # Essential hypertension, BP was low in the setting of sepsis then improved.  Medications were resumed.  Patient was advised to monitor BP at home and follow with PCP, cardio and nephro for further management as an outpatient. # H/o asthma, patient has shortness of breath most likely due to pneumonia and CHF Continue management as above. S/p DuoNeb every 6 hourly as needed # Type II diabetes mellitus with renal manifestations, HbA1c 8.2, resumed home medications on discharge.  Patient was on NovoLog sliding scale during hospital stay. # GERD (gastroesophageal reflux disease) continue PPI # Pulmonary hypertension due to left heart disease, Primarily venous hypertension by [facility] 03/2021 admission, On imdur which was held due to low blood pressure, but resumed on dc. F/u Cardiology.  Body mass index is 39.38 kg/m.  Nutrition Interventions:  Patient was ambulatory without any assistance. On the day of the discharge the patient's vitals were stable, and no other acute medical condition were reported by patient. the patient was felt safe to be discharge at Home.  Consultants: Cardiology,  nephrology, ID, PCCM Procedures: None  Discharge Exam: General: Appear in no distress, no Rash; Oral Mucosa Clear, moist. Cardiovascular: S1 and S2 Present, no Murmur, Respiratory: normal respiratory effort, Bilateral Air entry present and no Crackles, no wheezes Abdomen: Bowel Sound present, Soft and no tenderness, no hernia Extremities: no Pedal edema, no calf tenderness Neurology: alert and oriented to time, place, and person affect appropriate.  Filed Weights   06/26/22 0500 06/27/22 0450 06/28/22 0500  Weight: 126 kg 127.3 kg 124.5 kg   Vitals:   06/27/22 2328 06/28/22 0353  BP: (!) 151/81 (!) 157/81  Pulse: 75 67  Resp:  18  Temp:  98 F (36.7 C)  SpO2:  97%    DISCHARGE MEDICATION: Allergies as of 06/28/2022       Reactions   Contrast Media [iodinated Contrast Media] Other (See Comments)   Kidney transplant   Iodine Other (See Comments)   Other reaction(s): Other (See Comments) Kidney transplant   Ibuprofen Other (See Comments)   Due to kidney transplant Due to kidney transplant Due to kidney transplant        Medication List     STOP taking these medications    allopurinol 100 MG tablet Commonly known as: ZYLOPRIM   furosemide 80 MG tablet Commonly known as: LASIX       TAKE these medications    acetaminophen 325 MG tablet Commonly known as: Tylenol Take 2 tablets (  650 mg total) by mouth every 8 (eight) hours as needed for mild pain.   albuterol (2.5 MG/3ML) 0.083% nebulizer solution Commonly known as: PROVENTIL Inhale one vial (3 ml) via nebulizer every 6 hours as needed for wheezing or shortness of breath (Take 3 mLs (2.5 mg total) by nebulization every 6 (six) hours as needed for wheezing or shortness of breath.)   albuterol 108 (90 Base) MCG/ACT inhaler Commonly known as: VENTOLIN HFA Inhale 2 puffs into the lungs every 6 (six) hours as needed for wheezing or shortness of breath.   AMBULATORY NON FORMULARY MEDICATION Trimix  (30/1/10)-(Pap/Phent/PGE)  Test Dose  1ml vial   Qty #3 Refills 0  [facility] [phone] Fax [phone]   beclomethasone 80 MCG/ACT inhaler Commonly known as: QVAR Inhale 2 puffs into the lungs daily as needed (for shortness of breath).   buPROPion 150 MG 12 hr tablet Commonly known as: WELLBUTRIN SR Take 1 tablet (150 mg total) by mouth 2 (two) times daily.   cholecalciferol 25 MCG (1000 UNIT) tablet Commonly known as: VITAMIN D3 Take 1,000 Units by mouth at bedtime.   colchicine 0.6 MG tablet Take 1 tablet (0.6 mg total) by mouth every other day. What changed: Another medication with the same name was removed. Continue taking this medication, and follow the directions you see here.   cyanocobalamin 100 MCG tablet Take 100 mcg by mouth daily.   Eliquis 5 MG Tabs tablet Generic drug: apixaban TAKE 1 TABLET BY MOUTH TWICE DAILY What changed: how much to take   ertapenem 1,000 mg in sodium chloride 0.9 % 100 mL Inject 1,000 mg into the vein daily for 6 days. Start taking on: June 29, 2022   Farxiga 10 MG Tabs tablet Generic drug: dapagliflozin propanediol Take 1 tablet (10 mg total) by mouth daily before breakfast.   febuxostat 40 MG tablet Commonly known as: ULORIC Take 1 tablet (40 mg total) by mouth daily.   fluticasone 50 MCG/ACT nasal spray Commonly known as: FLONASE PLACE 2 SPRAYS INTO BOTH NOSTRILS DAILY.   FreeStyle Libre 3 Sensor Misc Use one every 2 weeks.   gabapentin 300 MG capsule Commonly known as: NEURONTIN Take 1 capsule (300 mg total) by mouth 2 (two) times daily.   hydrALAZINE 25 MG tablet Commonly known as: APRESOLINE Take 1 tablet (25 mg total) by mouth every 8 (eight) hours.   hydrocortisone 2.5 % cream Apply topically 3 (three) times daily as needed.   insulin lispro 100 UNIT/ML KwikPen Commonly known as: HumaLOG KwikPen Inject 10-20 Units into the skin 3 (three) times daily. Per sliding scale   iron polysaccharides  150 MG capsule Commonly known as: Ferrex 150 Take 1 capsule (150 mg total) by mouth daily.   isosorbide mononitrate 30 MG 24 hr tablet Commonly known as: IMDUR Take 1 tablet (30 mg total) by mouth daily.   lidocaine 5 % Commonly known as: LIDODERM Place 1 patch onto the skin daily.PLACE 1 PATCH ONTO THE SKIN FOR 12 (TWELVE) HOURS. REMOVE & DISCARD PATCH WITHIN 12 HOURS OR AS DIRECTED BY MD   lidocaine 5 % ointment Commonly known as: XYLOCAINE Apply 1 Application topically 3 (three) times daily as needed. Apply pea size amount to area as needed for pain.   losartan 50 MG tablet Commonly known as: COZAAR Take 50 mg by mouth daily.   metolazone 2.5 MG tablet Commonly known as: ZAROXOLYN Take 1 tablet by mouth 3 times weekly.   montelukast 10 MG tablet Commonly known as:  SINGULAIR TAKE 1 TABLET BY MOUTH AT BEDTIME. What changed: how much to take   multivitamin with minerals Tabs tablet Take 1 tablet by mouth daily.   mycophenolate 180 MG EC tablet Commonly known as: MYFORTIC Take 2 tablets (360 mg total) by mouth 2 (two) times daily.   omeprazole 20 MG capsule Commonly known as: PRILOSEC TAKE 1 CAPSULE BY MOUTH DAILY. What changed:  how much to take when to take this   potassium chloride 10 MEQ tablet Commonly known as: KLOR-CON Take 4 tablets (40 mEq total) by mouth daily. What changed:  how much to take when to take this   predniSONE 5 MG tablet Commonly known as: DELTASONE Take 1 tablet (5 mg total) by mouth daily.   rosuvastatin 10 MG tablet Commonly known as: CRESTOR Take 1 tablet (10 mg total) by mouth daily. What changed: when to take this   Semglee (yfgn) 100 UNIT/ML Pen Generic drug: insulin glargine-yfgn Inject 35 Units into the skin daily. What changed: when to take this   sulfamethoxazole-trimethoprim 400-80 MG tablet Commonly known as: BACTRIM TAKE 1 TABLET BY MOUTH EVERY MONDAY, WEDNESDAY, FRIDAY.   tacrolimus 1 MG capsule Commonly known  as: PROGRAF Take 2 capsules (2 mg total) by mouth 2 times daily.   tamsulosin 0.4 MG Caps capsule Commonly known as: FLOMAX TAKE 1 CAPSULE BY MOUTH DAILY. What changed: when to take this   Testosterone 10 MG/ACT (2%) Gel Apply 1 Pump topically daily to inner thighs - 2 pumps total.   torsemide 20 MG tablet Commonly known as: DEMADEX Take 5 tablets (100 mg total) by mouth daily. What changed: when to take this   Unifine Pentips 31G X 5 MM Misc Generic drug: Insulin Pen Needle Use 4 times daily as directed   zinc sulfate 220 (50 Zn) MG capsule Take 220 mg by mouth daily.       Allergies  Allergen Reactions   Contrast Media [Iodinated Contrast Media] Other (See Comments)    Kidney transplant   Iodine Other (See Comments)    Other reaction(s): Other (See Comments) Kidney transplant   Ibuprofen Other (See Comments)    Due to kidney transplant Due to kidney transplant Due to kidney transplant   Discharge Instructions     Call MD for:  difficulty breathing, headache or visual disturbances   Complete by: As directed    Call MD for:  extreme fatigue   Complete by: As directed    Call MD for:  persistant dizziness or light-headedness   Complete by: As directed    Call MD for:  persistant nausea and vomiting   Complete by: As directed    Call MD for:  severe uncontrolled pain   Complete by: As directed    Call MD for:  temperature >100.4   Complete by: As directed    Diet - low sodium heart healthy   Complete by: As directed    Discharge instructions   Complete by: As directed    Follow-up with PCP in 1 week, repeat BMP after 1 week Follow-up with nephrology in 1 week Follow-up with ID and continue IV antibiotics daily till 07/05/2022. Follow-up with cardiology as per schedule   Increase activity slowly   Complete by: As directed        The results of significant diagnostics from this hospitalization (including imaging, microbiology, ancillary and laboratory) are  listed below for reference.    Significant Diagnostic Studies: DG Chest Port 1 View  Result Date: 06/22/2022  CLINICAL DATA:  Shortness of breath.  Admitted for pyelonephritis. EXAM: PORTABLE CHEST 1 VIEW COMPARISON:  Chest radiographs 06/21/2022 and 04/02/2022. CT 06/21/2022. FINDINGS: 1131 hours. The heart size and mediastinal contours are stable status post median sternotomy and CABG. Chronic vascular congestion without overt pulmonary edema or confluent airspace opacity. There is no pleural effusion or pneumothorax. The bones appear unchanged.  Telemetry leads overlie the chest. IMPRESSION: Stable examination. Chronic vascular congestion without overt edema or confluent airspace opacity. Electronically Signed   By: Carey Bullocks M.D.   On: 06/22/2022 11:48   US Renal Transplant w/Doppler  Result Date: 06/21/2022 CLINICAL DATA:  Acute kidney failure, urinary tract infection EXAM: ULTRASOUND OF RENAL TRANSPLANT WITH RENAL DOPPLER ULTRASOUND TECHNIQUE: Ultrasound examination of the renal transplant was performed with gray-scale, color and duplex doppler evaluation. COMPARISON:  CT today FINDINGS: Transplant kidney location: Right lower quadrant Transplant Kidney: Renal measurements: 14.0 x 6.7 x 6.1 cm = volume: . Normal size and echotexture. Mild hydronephrosis in the lower pole. Color flow in the main renal artery:  Yes Color flow in the main renal vein:  Yes Duplex Doppler Evaluation: Main Renal Artery Resistive Index: 0.84 Venous waveform in main renal vein:  Present Intrarenal resistive index in upper pole:  0.79 (normal 0.6-0.8; equivocal 0.8-0.9; abnormal >= 0.9) Intrarenal resistive index in lower pole: 0.8 (normal 0.6-0.8; equivocal 0.8-0.9; abnormal >= 0.9) Bladder: Not visualized Other findings:  None. IMPRESSION: Mild fullness of the collecting system in the lower pole of the right lower quadrant renal transplant. Electronically Signed   By: Charlett Nose M.D.   On: 06/21/2022 18:34   CT  CHEST ABDOMEN PELVIS WO CONTRAST  Result Date: 06/21/2022 CLINICAL DATA:  Fever with hematuria.  Status post renal transplant. EXAM: CT CHEST, ABDOMEN AND PELVIS WITHOUT CONTRAST TECHNIQUE: Multidetector CT imaging of the chest, abdomen and pelvis was performed following the standard protocol without IV contrast. RADIATION DOSE REDUCTION: This exam was performed according to the departmental dose-optimization program which includes automated exposure control, adjustment of the mA and/or kV according to patient size and/or use of iterative reconstruction technique. COMPARISON:  CT stone study 01/03/2022. FINDINGS: CT CHEST FINDINGS Cardiovascular: The heart is enlarged. Coronary artery calcification is evident. Status post CABG. Mild atherosclerotic calcification is noted in the wall of the thoracic aorta. Enlargement of the pulmonary outflow tract/main pulmonary arteries suggests pulmonary arterial hypertension. Mediastinum/Nodes: Densely calcified right paratracheal lymph node or nodule is stable since 07/01/2019 consistent with benign etiology. Calcified nodal tissue is seen in the right hilum. The esophagus has normal imaging features. There is no axillary lymphadenopathy. Lungs/Pleura: Fine architectural detail of the lungs is obscured bilaterally by breathing motion. 2.9 x 1.0 cm nodular consolidative opacity in the right lower lobe (86/4) is stable since prior. This is stable since chest CT 07/01/2019 consistent with benign etiology. No new suspicious pulmonary nodule or mass. No focal airspace consolidation. No pleural effusion. Musculoskeletal: No worrisome lytic or sclerotic osseous abnormality. CT ABDOMEN PELVIS FINDINGS Hepatobiliary: No suspicious focal abnormality in the liver on this study without intravenous contrast. Subtle nodularity of liver contour suggests cirrhosis. Gallbladder is not visualized. No intrahepatic or extrahepatic biliary dilation. Pancreas: No focal mass lesion. No dilatation of  the main duct. No intraparenchymal cyst. No peripancreatic edema. Spleen: Mild splenomegaly. Adrenals/Urinary Tract: No adrenal nodule or mass. Native kidneys are atrophic bilaterally. Small exophytic cyst lower pole right kidney is unchanged. No followup imaging is recommended. 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 No  hydronephrosis in the upper pole collecting system although inferior moiety demonstrates mild hydronephrosis, new in the interval with mild fullness of the ureter of the lower pole Mighty down to the level of the ureteral confluence. Bladder is nondistended which likely accentuates the mild circumferential bladder wall thickening. Stomach/Bowel: Surgical changes in the stomach compatible with sleeve gastrectomy. Duodenum is normally positioned as is the ligament of Treitz. No small bowel wall thickening. No small bowel dilatation. The terminal ileum is normal. The appendix is normal. No gross colonic mass. No colonic wall thickening. Vascular/Lymphatic: There is mild atherosclerotic calcification of the abdominal aorta without aneurysm. There is no gastrohepatic or hepatoduodenal ligament lymphadenopathy. No retroperitoneal or mesenteric lymphadenopathy. No pelvic sidewall lymphadenopathy. Reproductive: Sequelae of urolift procedure noted in the prostate. Other: No intraperitoneal free fluid. Musculoskeletal: No worrisome lytic or sclerotic osseous abnormality. IMPRESSION: 1. No acute findings in the chest. 2. 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 No hydronephrosis in the upper pole collecting system although inferior moiety demonstrates mild hydronephrosis, new in the interval with mild fullness of the lower pole ureter to the level of the ureteral confluence. No obstructing stone or mass lesion evident. 3. Enlargement of the pulmonary outflow tract/main pulmonary arteries suggests  pulmonary arterial hypertension. 4. Stable 2.9 x 1.0 cm nodular consolidative opacity in the right lower lobe since chest CT 07/01/2019 consistent with benign etiology, potentially scarring. 5. Subtle changes in the liver suggesting cirrhosis and stable mild splenomegaly would be compatible with portal venous hypertension. 6.  Aortic Atherosclerosis (ICD10-I70.0). Electronically Signed   By: Kennith Center M.D.   On: 06/21/2022 10:01   CT HEAD WO CONTRAST ( )  Result Date: 06/21/2022 CLINICAL DATA:  Confusion.  Headache. EXAM: CT HEAD WITHOUT CONTRAST TECHNIQUE: Contiguous axial images were obtained from the base of the skull through the vertex without intravenous contrast. RADIATION DOSE REDUCTION: This exam was performed according to the departmental dose-optimization program which includes automated exposure control, adjustment of the mA and/or kV according to patient size and/or use of iterative reconstruction technique. COMPARISON:  11/05/2018 FINDINGS: Brain: There is no evidence for acute hemorrhage, hydrocephalus, mass lesion, or abnormal extra-axial fluid collection. No definite CT evidence for acute infarction. Patchy low attenuation in the deep hemispheric and periventricular white matter is nonspecific, but likely reflects chronic microvascular ischemic demyelination. Vascular: No hyperdense vessel or unexpected calcification. Skull: No evidence for fracture. No worrisome lytic or sclerotic lesion. Sinuses/Orbits: The visualized paranasal sinuses and right mastoid air cells are clear. Stable trace inferior left mastoid effusion. Visualized portions of the globes and intraorbital fat are unremarkable. Other: None. IMPRESSION: 1. No acute intracranial abnormality. 2. Chronic small vessel ischemic disease. 3. Stable trace inferior left mastoid effusion. Electronically Signed   By: Kennith Center M.D.   On: 06/21/2022 09:36   DG Chest Portable 1 View  Result Date: 06/21/2022 CLINICAL DATA:  Shortness  of breath, fever, mild confused, blood in urine, rolling and leaning to LEFT side, past history of renal transplant EXAM: PORTABLE CHEST 1 VIEW COMPARISON:  Portable exam 0850 hours compared to 04/02/2022 FINDINGS: Enlargement of cardiac silhouette post CABG. Mediastinal contours and pulmonary vascularity normal. Atherosclerotic calcification aorta. Large calcified RIGHT paratracheal lymph node stable. Chronic elevation/eventration of RIGHT diaphragm with mild RIGHT basilar atelectasis. Small nodular foci in the LEFT mid lung appear more prominent than on previous exam. Remaining lungs clear. No pleural effusion or pneumothorax. Osseous structures unremarkable. IMPRESSION: Enlargement of cardiac silhouette post  CABG. Old granulomatous disease with multiple small nodular foci in the LEFT mid lung, more prominent than on previous exam; potentially this could represent a an atypical infiltrate in a transplant patient but CT chest recommended to exclude developing pulmonary nodules. Electronically Signed   By: Ulyses Southward M.D.   On: 06/21/2022 09:03    Microbiology: Recent Results (from the past 240 hour(s))  Blood culture (routine x 2)     Status: Abnormal   Collection Time: 06/21/22  8:38 AM   Specimen: BLOOD  Result Value Ref Range Status   Specimen Description   Final    BLOOD RIGHT ARM Performed at [facility], [address] [place], [place] [address]    Special Requests   Final    NONE Performed at [facility], [address] [place], [place] [address]    Culture  Setup Time   Final    Organism ID to follow GRAM NEGATIVE RODS AEROBIC BOTTLE ONLY CRITICAL RESULT CALLED TO, READ BACK BY AND VERIFIED WITH: RAQUEL RODRIGUEZ GUZMAN AT 1422 06/22/22.PMF  REVIEWED BY A. LAFRANCE Performed at [facility], [name], [place], [place] [address]    Culture SERRATIA MARCESCENS (A)  Final   Report Status 06/28/2022 FINAL  Final   Organism ID, Bacteria SERRATIA  MARCESCENS  Final      Susceptibility   Serratia marcescens - MIC*    CEFEPIME <=0.12 SENSITIVE Sensitive     CEFTAZIDIME <=1 SENSITIVE Sensitive     CEFTRIAXONE <=0.25 SENSITIVE Sensitive     CIPROFLOXACIN 0.5 INTERMEDIATE Intermediate     GENTAMICIN <=1 SENSITIVE Sensitive     TRIMETH/SULFA >=320 RESISTANT Resistant     ERTAPENEM Value in next row Sensitive      SENSITIVE0.12    * SERRATIA MARCESCENS  Blood Culture ID Panel (Reflexed)     Status: Abnormal   Collection Time: 06/21/22  8:38 AM  Result Value Ref Range Status   Enterococcus faecalis NOT DETECTED NOT DETECTED Final   Enterococcus Faecium NOT DETECTED NOT DETECTED Final   Listeria monocytogenes NOT DETECTED NOT DETECTED Final   Staphylococcus species NOT DETECTED NOT DETECTED Final   Staphylococcus aureus (BCID) NOT DETECTED NOT DETECTED Final   Staphylococcus epidermidis NOT DETECTED NOT DETECTED Final   Staphylococcus lugdunensis NOT DETECTED NOT DETECTED Final   Streptococcus species NOT DETECTED NOT DETECTED Final   Streptococcus agalactiae NOT DETECTED NOT DETECTED Final   Streptococcus pneumoniae NOT DETECTED NOT DETECTED Final   Streptococcus pyogenes NOT DETECTED NOT DETECTED Final   A.calcoaceticus-baumannii NOT DETECTED NOT DETECTED Final   Bacteroides fragilis NOT DETECTED NOT DETECTED Final   Enterobacterales DETECTED (A) NOT DETECTED Final    Comment: Enterobacterales represent a large order of gram negative bacteria, not a single organism. CRITICAL RESULT CALLED TO, READ BACK BY AND VERIFIED WITH: RAQUEL RODRIGUEZ GUZMAN AT 1422 06/22/22.PMF    Enterobacter cloacae complex NOT DETECTED NOT DETECTED Final   Escherichia coli NOT DETECTED NOT DETECTED Final   Klebsiella aerogenes NOT DETECTED NOT DETECTED Final   Klebsiella oxytoca NOT DETECTED NOT DETECTED Final   Klebsiella pneumoniae NOT DETECTED NOT DETECTED Final   Proteus species NOT DETECTED NOT DETECTED Final   Salmonella species NOT DETECTED NOT  DETECTED Final   Serratia marcescens DETECTED (A) NOT DETECTED Final    Comment: CRITICAL RESULT CALLED TO, READ BACK BY AND VERIFIED WITH: RAQUEL RODRIGUEZ GUZMAN AT 1422 06/22/22.PMF    Haemophilus influenzae NOT DETECTED NOT DETECTED Final  Neisseria meningitidis NOT DETECTED NOT DETECTED Final   Pseudomonas aeruginosa NOT DETECTED NOT DETECTED Final   Stenotrophomonas maltophilia NOT DETECTED NOT DETECTED Final   Candida albicans NOT DETECTED NOT DETECTED Final   Candida auris NOT DETECTED NOT DETECTED Final   Candida glabrata NOT DETECTED NOT DETECTED Final   Candida krusei NOT DETECTED NOT DETECTED Final   Candida parapsilosis NOT DETECTED NOT DETECTED Final   Candida tropicalis NOT DETECTED NOT DETECTED Final   Cryptococcus neoformans/gattii NOT DETECTED NOT DETECTED Final   CTX-M ESBL NOT DETECTED NOT DETECTED Final   Carbapenem resistance IMP NOT DETECTED NOT DETECTED Final   Carbapenem resistance KPC NOT DETECTED NOT DETECTED Final   Carbapenem resistance NDM NOT DETECTED NOT DETECTED Final   Carbapenem resist OXA 48 LIKE NOT DETECTED NOT DETECTED Final   Carbapenem resistance VIM NOT DETECTED NOT DETECTED Final    Comment: Performed at [facility], [address] [place], [place] [address]  Urine Culture     Status: Abnormal   Collection Time: 06/21/22  8:48 AM   Specimen: Urine, Clean Catch  Result Value Ref Range Status   Specimen Description   Final    URINE, CLEAN CATCH Performed at [facility], [address] [place], [place] [address]    Special Requests   Final    NONE Performed at [facility], [address] [place], [place] [address]    Culture >=100,000 COLONIES/mL SERRATIA MARCESCENS (A)  Final   Report Status 06/23/2022 FINAL  Final   Organism ID, Bacteria SERRATIA MARCESCENS (A)  Final      Susceptibility   Serratia marcescens - MIC*    CEFEPIME <=0.12 SENSITIVE Sensitive     CEFTRIAXONE <=0.25 SENSITIVE Sensitive      CIPROFLOXACIN 0.5 INTERMEDIATE Intermediate     GENTAMICIN <=1 SENSITIVE Sensitive     NITROFURANTOIN 256 RESISTANT Resistant     TRIMETH/SULFA >=320 RESISTANT Resistant     * >=100,000 COLONIES/mL SERRATIA MARCESCENS  Resp panel by RT-PCR (RSV, Flu A&B, Covid) Anterior Nasal Swab     Status: None   Collection Time: 06/21/22  8:48 AM   Specimen: Anterior Nasal Swab  Result Value Ref Range Status   SARS Coronavirus 2 by RT PCR NEGATIVE NEGATIVE Final    Comment: (NOTE) SARS-CoV-2 target nucleic acids are NOT DETECTED.  The SARS-CoV-2 RNA is generally detectable in upper respiratory specimens during the acute phase of infection. The lowest concentration of SARS-CoV-2 viral copies this assay can detect is 138 copies/mL. A negative result does not preclude SARS-Cov-2 infection and should not be used as the sole basis for treatment or other patient management decisions. A negative result may occur with  improper specimen collection/handling, submission of specimen other than nasopharyngeal swab, presence of viral mutation(s) within the areas targeted by this assay, and inadequate number of viral copies(<138 copies/mL). A negative result must be combined with clinical observations, patient history, and epidemiological information. The expected result is Negative.  Fact Sheet for Patients:  BloggerCourse.comhttps://www.fda.gov/media/152166/download  Fact Sheet for Healthcare Providers:  SeriousBroker.ithttps://www.fda.gov/media/152162/download  This test is no t yet approved or cleared by the [place] FDA and  has been authorized for detection and/or diagnosis of SARS-CoV-2 by FDA under an Emergency Use Authorization (EUA). This EUA will remain  in effect (meaning this test can be used) for the duration of the COVID-19 declaration under Section 564(b)(1) of the Act, 21 U.S.C.section 360bbb-3(b)(1), unless the authorization is terminated  or revoked sooner.  Influenza A by PCR NEGATIVE NEGATIVE Final    Influenza B by PCR NEGATIVE NEGATIVE Final    Comment: (NOTE) The Xpert Xpress SARS-CoV-2/FLU/RSV plus assay is intended as an aid in the diagnosis of influenza from Nasopharyngeal swab specimens and should not be used as a sole basis for treatment. Nasal washings and aspirates are unacceptable for Xpert Xpress SARS-CoV-2/FLU/RSV testing.  Fact Sheet for Patients: BloggerCourse.com  Fact Sheet for Healthcare Providers: SeriousBroker.it  This test is not yet approved or cleared by the [place] FDA and has been authorized for detection and/or diagnosis of SARS-CoV-2 by FDA under an Emergency Use Authorization (EUA). This EUA will remain in effect (meaning this test can be used) for the duration of the COVID-19 declaration under Section 564(b)(1) of the Act, 21 U.S.C. section 360bbb-3(b)(1), unless the authorization is terminated or revoked.     Resp Syncytial Virus by PCR NEGATIVE NEGATIVE Final    Comment: (NOTE) Fact Sheet for Patients: BloggerCourse.com  Fact Sheet for Healthcare Providers: SeriousBroker.it  This test is not yet approved or cleared by the [place] FDA and has been authorized for detection and/or diagnosis of SARS-CoV-2 by FDA under an Emergency Use Authorization (EUA). This EUA will remain in effect (meaning this test can be used) for the duration of the COVID-19 declaration under Section 564(b)(1) of the Act, 21 U.S.C. section 360bbb-3(b)(1), unless the authorization is terminated or revoked.  Performed at [facility], [address] [place], [place] [address]   Culture, blood (x 2)     Status: None   Collection Time: 06/21/22  1:20 PM   Specimen: BLOOD  Result Value Ref Range Status   Specimen Description BLOOD BLOOD RIGHT ARM  Final   Special Requests   Final    BOTTLES DRAWN AEROBIC AND ANAEROBIC Blood Culture adequate volume    Culture   Final    NO GROWTH 5 DAYS Performed at [facility], [address] [place], [place] [address]    Report Status 06/26/2022 FINAL  Final  Culture, blood (x 2)     Status: None   Collection Time: 06/21/22  1:20 PM   Specimen: BLOOD  Result Value Ref Range Status   Specimen Description BLOOD BLOOD RIGHT HAND  Final   Special Requests   Final    BOTTLES DRAWN AEROBIC AND ANAEROBIC Blood Culture results may not be optimal due to an excessive volume of blood received in culture bottles   Culture   Final    NO GROWTH 5 DAYS Performed at [facility], [address] [place], [place] [address]    Report Status 06/26/2022 FINAL  Final  MRSA Next Gen by PCR, Nasal     Status: None   Collection Time: 06/21/22  1:57 PM   Specimen: Nasal Mucosa; Nasal Swab  Result Value Ref Range Status   MRSA by PCR Next Gen NOT DETECTED NOT DETECTED Final    Comment: (NOTE) The GeneXpert MRSA Assay (FDA approved for NASAL specimens only), is one component of a comprehensive MRSA colonization surveillance program. It is not intended to diagnose MRSA infection nor to guide or monitor treatment for MRSA infections. Test performance is not FDA approved in patients less than 31 years old. Performed at [facility], [address] [place], [place] [address]   Culture, blood (Routine X 2) w Reflex to ID Panel     Status: None   Collection Time: 06/23/22  5:00 AM   Specimen: BLOOD  Result  Value Ref Range Status   Specimen Description BLOOD BLOOD RIGHT ARM  Final   Special Requests   Final    BOTTLES DRAWN AEROBIC AND ANAEROBIC Blood Culture results may not be optimal due to an excessive volume of blood received in culture bottles   Culture   Final    NO GROWTH 5 DAYS Performed at [facility], [address] [place], [place] [address]    Report Status 06/28/2022 FINAL  Final  Culture, blood (Routine X 2) w Reflex to ID Panel     Status: None    Collection Time: 06/23/22  5:14 AM   Specimen: BLOOD  Result Value Ref Range Status   Specimen Description BLOOD BLOOD RIGHT HAND  Final   Special Requests   Final    BOTTLES DRAWN AEROBIC AND ANAEROBIC Blood Culture results may not be optimal due to an excessive volume of blood received in culture bottles   Culture   Final    NO GROWTH 5 DAYS Performed at [facility], [name] [place], [place] [address]    Report Status 06/28/2022 FINAL  Final     Labs: CBC: Recent Labs  Lab 06/24/22 0326 06/25/22 0354 06/26/22 0356 06/27/22 0419 06/28/22 0605  WBC 5.0 4.8 3.6* 4.0 4.8  HGB 10.3* 10.4* 10.3* 10.2* 10.6*  HCT 35.3* 34.1* 34.4* 33.8* 35.7*  MCV 78.8* 76.6* 76.6* 75.8* 76.8*  PLT 95* 102* 105* 121* 135*   Basic Metabolic Panel: Recent Labs  Lab 06/22/22 0230 06/22/22 0233 06/23/22 0500 06/24/22 0326 06/24/22 1839 06/25/22 0354 06/26/22 0356 06/27/22 0419 06/28/22 0605  NA  --    < > 139 139 134* 139 137 140 142  K  --    < > 3.5 3.5 3.5 3.1* 3.7 3.5 3.5  CL  --    < > 101 100 98 102 100 103 105  CO2  --    < > 29 30 26 28 27 26 27   GLUCOSE  --    < > 125* 156* 288* 105* 139* 172* 172*  BUN  --    < > 63* 73* 85* 82* 96* 100* 93*  CREATININE  --    < > 2.36* 2.61* 3.07* 3.16* 3.84* 3.86* 3.31*  CALCIUM  --    < > 9.0 9.0 8.9 9.1 8.9 9.1 9.2  MG 2.0  --  2.3 2.2  --  2.3  --   --   --   PHOS 2.8  --  4.4 3.1  --  3.1  --   --   --    < > = values in this interval not displayed.   Liver Function Tests: Recent Labs  Lab 06/22/22 0233  AST 27  ALT 14  ALKPHOS 91  BILITOT 1.6*  PROT 6.5  ALBUMIN 2.9*   No results for input(s): "LIPASE", "AMYLASE" in the last 168 hours. No results for input(s): "AMMONIA" in the last 168 hours. Cardiac Enzymes: No results for input(s): "CKTOTAL", "CKMB", "CKMBINDEX", "TROPONINI" in the last 168 hours. BNP (last 3 results) Recent Labs    05/09/22 1218 05/30/22 1149 06/21/22 0848  BNP 352.3* 430.9* 465.0*    CBG: Recent Labs  Lab 06/27/22 2329 06/28/22 0342 06/28/22 0433 06/28/22 0822 06/28/22 1208  GLUCAP 201* 70 163* 145* 253*    Time spent: 35 minutes  Signed:  Gillis Santa  [facility] 06/28/2022  4:58 PM

## 2022-06-28 NOTE — Inpatient Diabetes Management (Signed)
Inpatient Diabetes Program Recommendations  AACE/ADA: New Consensus Statement on Inpatient Glycemic Control (2015)  Target Ranges:  Prepandial:   less than 140 mg/dL      Peak postprandial:   less than 180 mg/dL (1-2 hours)      Critically ill patients:  140 - 180 mg/dL   Lab Results  Component Value Date   GLUCAP 145 (H) 06/28/2022   HGBA1C 8.2 (H) 06/21/2022    Review of Glycemic Control  Latest Reference Range & Units 06/27/22 23:29 06/28/22 03:42 06/28/22 04:33 06/28/22 08:22  Glucose-Capillary 70 - 99 mg/dL 761 (H) 70 518 (H) 343 (H)  (H): Data is abnormally high   Latest Reference Range & Units 06/27/22 09:52 06/27/22 14:30 06/27/22 17:46 06/27/22 20:02  Glucose-Capillary 70 - 99 mg/dL 735 (H) 789 (H) 784 (H) 364 (H)  (H): Data is abnormally high  Diabetes history: DM2 Outpatient Diabetes medications: Semglee 40 units QHS, Humalog 10-20 units sliding scale, Farxiga 10 mg QD Current orders for Inpatient glycemic control: Novolog 0-15 units Q4H, Prednisone 5 mg QD   Inpatient Diabetes Program Recommendations:     Postprandial glucose elevated.  Might consider:   Novolog 0-15 units TID and 0-5 QHS  Novolog 2-3 units TID with meals   Will continue to follow while inpatient.   Thank you, Dulce Sellar, MSN, CDCES Diabetes Coordinator Inpatient Diabetes Program [phone] (team pager from 8a-5p)

## 2022-06-28 NOTE — Consult Note (Signed)
Pharmacy Antibiotic Note  [name]. is a 62 y.o. male with PMH including CKD s/p renal transplant, CAD, HTN, HLD, gout, asthma, Afib, BPH with LUTS, combined systolic and diastolic CHF, GERD, obesity, DM, PAH, depression admitted on 06/21/2022 with  pyelonephritis . Code sepsis activated in the ED. Pharmacy has been consulted for cefepime dosing. ID is following.  AKI slowly resolving. Calculated CrCl today is 30.6 mL/min. Normalized CrCl 23.5 mL/min.  Plan:  Will go ahead and modify cefepime to 2 g IV q12h Patient is borderline renal dose adjustment. However, given improving Scr trend will go ahead and make adjustment and monitor closely  Follow-up cultures, clinical course, duration (patient is immunocompromised status)  Height: 5\' 10"  (177.8 cm) Weight: 124.5 kg (274 lb 7.6 oz) IBW/kg (Calculated) : 73  Temp (24hrs), Avg:98 F (36.7 C), Min:97.4 F (36.3 C), Max:98.2 F (36.8 C)  Recent Labs  Lab 06/21/22 0839 06/21/22 1119 06/22/22 0233 06/24/22 0326 06/24/22 1839 06/25/22 0354 06/26/22 0356 06/27/22 0419 06/28/22 0605  WBC  --   --    < > 5.0  --  4.8 3.6* 4.0 4.8  CREATININE  --   --    < > 2.61* 3.07* 3.16* 3.84* 3.86* 3.31*  LATICACIDVEN 2.1* 1.8  --   --   --   --   --   --   --    < > = values in this interval not displayed.     Estimated Creatinine Clearance: 30.6 mL/min (A) (by C-G formula based on SCr of 3.31 mg/dL (H)).    Allergies  Allergen Reactions   Contrast Media [Iodinated Contrast Media] Other (See Comments)    Kidney transplant   Iodine Other (See Comments)    Other reaction(s): Other (See Comments) Kidney transplant   Ibuprofen Other (See Comments)    Due to kidney transplant Due to kidney transplant Due to kidney transplant    Antimicrobials this admission: Vancomycin 3/29 x 1 Metronidazole 3/29 x 1 Azithromycin 3/30 >> 4/1 Cefepime 3/29 >>   Dose adjustments this admission: N/A  Microbiology results: Bcx 3/29:  Serratia marcescens (Cipro I, Bactrim R) Ucx 3/29: Serratia marcescens (Cipro I, Bactrim R, NFT R) Bcx 3/31: NGTD  Thank you for allowing pharmacy to be a part of this patient's care.  Tressie Ellis 06/28/2022 7:54 AM

## 2022-06-29 ENCOUNTER — Ambulatory Visit
Admit: 2022-06-29 | Discharge: 2022-06-29 | Disposition: A | Discharge status: Home / Self Care | Payer: Commercial Managed Care - PPO | Attending: Student | Admitting: Student

## 2022-06-29 MED ORDER — SODIUM CHLORIDE 0.9 % IV SOLN
1.0000 g | INTRAVENOUS | Status: DC
  Administered 2022-06-29: 1000 mg via INTRAVENOUS
  Filled 2022-06-29 (×2): qty 1

## 2022-06-29 MED ORDER — SODIUM CHLORIDE 0.9 % IV SOLN: 1.0000 g | INTRAVENOUS | Status: DC

## 2022-06-30 ENCOUNTER — Ambulatory Visit
Admit: 2022-06-30 | Discharge: 2022-06-30 | Disposition: A | Discharge status: Home / Self Care | Payer: Commercial Managed Care - PPO | Source: Ambulatory Visit | Attending: Infectious Diseases | Admitting: Infectious Diseases

## 2022-06-30 VITALS — BP 138/94 | HR 65 | Resp 16

## 2022-06-30 DIAGNOSIS — R7881 Bacteremia: Secondary | ICD-10-CM

## 2022-06-30 DIAGNOSIS — A498 Other bacterial infections of unspecified site: Secondary | ICD-10-CM

## 2022-06-30 DIAGNOSIS — N390 Urinary tract infection, site not specified: Secondary | ICD-10-CM

## 2022-06-30 MED ORDER — SODIUM CHLORIDE 0.9 % IV SOLN
1.0000 g | Freq: Once | INTRAVENOUS | Status: AC
  Administered 2022-06-30: 1000 mg via INTRAVENOUS
  Filled 2022-06-30: qty 1

## 2022-07-01 ENCOUNTER — Telehealth: Payer: Self-pay

## 2022-07-01 ENCOUNTER — Ambulatory Visit
Admission: RE | Admit: 2022-07-01 | Discharge: 2022-07-01 | Disposition: A | Discharge status: Home / Self Care | Payer: Commercial Managed Care - PPO | Source: Ambulatory Visit | Attending: Infectious Diseases | Admitting: Infectious Diseases

## 2022-07-01 VITALS — BP 152/90 | HR 60 | Temp 97.2°F | Resp 16

## 2022-07-01 DIAGNOSIS — R7881 Bacteremia: Secondary | ICD-10-CM

## 2022-07-01 DIAGNOSIS — A498 Other bacterial infections of unspecified site: Secondary | ICD-10-CM

## 2022-07-01 DIAGNOSIS — N390 Urinary tract infection, site not specified: Secondary | ICD-10-CM | POA: Diagnosis not present

## 2022-07-01 LAB — GLUCOSE, CAPILLARY: Glucose-Capillary: 147 mg/dL — ABNORMAL HIGH (ref 70–99)

## 2022-07-01 MED ORDER — SODIUM CHLORIDE 0.9 % IV SOLN
1.0000 g | INTRAVENOUS | Status: DC
  Administered 2022-07-01: 1000 mg via INTRAVENOUS
  Filled 2022-07-01 (×2): qty 1

## 2022-07-01 NOTE — Transitions of Care (Post Inpatient/ED Visit) (Signed)
   07/01/2022  Name: [name]. MRN: [id number] DOB: [birth year]  Today's TOC FU Call Status: Today's TOC FU Call Status:: Unsuccessul Call (1st Attempt) Unsuccessful Call (1st Attempt) Date: 07/01/22  Attempted to reach the patient regarding the most recent Inpatient/ED visit.  Follow Up Plan: Additional outreach attempts will be made to reach the patient to complete the Transitions of Care (Post Inpatient/ED visit) call.   Agnes Lawrence, CMA (AAMA)  [facility] [phone]

## 2022-07-01 NOTE — Progress Notes (Signed)
At end of infusion, patient had chills, visible shivering BP low for patient's baseline. Patient got up used restroom, returned to chair, monitored for 30 minutes. Patient feeling better, no chills, BP back to baseline. IV removed, patient ambulated to lobby independently.

## 2022-07-02 ENCOUNTER — Ambulatory Visit: Payer: Commercial Managed Care - PPO | Attending: Infectious Diseases | Admitting: Infectious Diseases

## 2022-07-02 ENCOUNTER — Other Ambulatory Visit: Payer: Medicare Other

## 2022-07-02 ENCOUNTER — Other Ambulatory Visit: Payer: Self-pay | Admitting: Infectious Diseases

## 2022-07-02 ENCOUNTER — Ambulatory Visit
Admission: RE | Admit: 2022-07-02 | Discharge: 2022-07-02 | Disposition: A | Discharge status: Home / Self Care | Payer: Commercial Managed Care - PPO | Source: Ambulatory Visit | Attending: Infectious Diseases | Admitting: Infectious Diseases

## 2022-07-02 DIAGNOSIS — N186 End stage renal disease: Secondary | ICD-10-CM | POA: Diagnosis not present

## 2022-07-02 DIAGNOSIS — N179 Acute kidney failure, unspecified: Secondary | ICD-10-CM | POA: Diagnosis not present

## 2022-07-02 DIAGNOSIS — I5040 Unspecified combined systolic (congestive) and diastolic (congestive) heart failure: Secondary | ICD-10-CM | POA: Diagnosis not present

## 2022-07-02 DIAGNOSIS — N400 Enlarged prostate without lower urinary tract symptoms: Secondary | ICD-10-CM | POA: Diagnosis not present

## 2022-07-02 DIAGNOSIS — I132 Hypertensive heart and chronic kidney disease with heart failure and with stage 5 chronic kidney disease, or end stage renal disease: Secondary | ICD-10-CM | POA: Insufficient documentation

## 2022-07-02 DIAGNOSIS — A498 Other bacterial infections of unspecified site: Secondary | ICD-10-CM

## 2022-07-02 DIAGNOSIS — B9689 Other specified bacterial agents as the cause of diseases classified elsewhere: Secondary | ICD-10-CM | POA: Diagnosis not present

## 2022-07-02 DIAGNOSIS — N390 Urinary tract infection, site not specified: Secondary | ICD-10-CM | POA: Diagnosis not present

## 2022-07-02 DIAGNOSIS — E876 Hypokalemia: Secondary | ICD-10-CM | POA: Insufficient documentation

## 2022-07-02 DIAGNOSIS — R7881 Bacteremia: Secondary | ICD-10-CM

## 2022-07-02 DIAGNOSIS — Z940 Kidney transplant status: Secondary | ICD-10-CM | POA: Diagnosis not present

## 2022-07-02 DIAGNOSIS — R197 Diarrhea, unspecified: Secondary | ICD-10-CM

## 2022-07-02 DIAGNOSIS — N12 Tubulo-interstitial nephritis, not specified as acute or chronic: Secondary | ICD-10-CM | POA: Insufficient documentation

## 2022-07-02 LAB — CBC WITH DIFFERENTIAL/PLATELET
Abs Immature Granulocytes: 0.05 10*3/uL (ref 0.00–0.07)
Basophils Absolute: 0 10*3/uL (ref 0.0–0.1)
Basophils Relative: 0 %
Eosinophils Absolute: 0 10*3/uL (ref 0.0–0.5)
Eosinophils Relative: 1 %
HCT: 36.6 % — ABNORMAL LOW (ref 39.0–52.0)
Hemoglobin: 10.8 g/dL — ABNORMAL LOW (ref 13.0–17.0)
Immature Granulocytes: 1 %
Lymphocytes Relative: 8 %
Lymphs Abs: 0.5 10*3/uL — ABNORMAL LOW (ref 0.7–4.0)
MCH: 23.5 pg — ABNORMAL LOW (ref 26.0–34.0)
MCHC: 29.5 g/dL — ABNORMAL LOW (ref 30.0–36.0)
MCV: 79.7 fL — ABNORMAL LOW (ref 80.0–100.0)
Monocytes Absolute: 0.4 10*3/uL (ref 0.1–1.0)
Monocytes Relative: 6 %
Neutro Abs: 5.4 10*3/uL (ref 1.7–7.7)
Neutrophils Relative %: 84 %
Platelets: 138 10*3/uL — ABNORMAL LOW (ref 150–400)
RBC: 4.59 MIL/uL (ref 4.22–5.81)
RDW: 19.7 % — ABNORMAL HIGH (ref 11.5–15.5)
WBC: 6.4 10*3/uL (ref 4.0–10.5)
nRBC: 0 % (ref 0.0–0.2)

## 2022-07-02 LAB — COMPREHENSIVE METABOLIC PANEL
ALT: 16 U/L (ref 0–44)
AST: 22 U/L (ref 15–41)
Albumin: 3.1 g/dL — ABNORMAL LOW (ref 3.5–5.0)
Alkaline Phosphatase: 130 U/L — ABNORMAL HIGH (ref 38–126)
Anion gap: 11 (ref 5–15)
BUN: 83 mg/dL — ABNORMAL HIGH (ref 8–23)
CO2: 30 mmol/L (ref 22–32)
Calcium: 8.4 mg/dL — ABNORMAL LOW (ref 8.9–10.3)
Chloride: 100 mmol/L (ref 98–111)
Creatinine, Ser: 3.12 mg/dL — ABNORMAL HIGH (ref 0.61–1.24)
GFR, Estimated: 22 mL/min — ABNORMAL LOW (ref >60)
Glucose, Bld: 236 mg/dL — ABNORMAL HIGH (ref 70–99)
Potassium: 3 mmol/L — ABNORMAL LOW (ref 3.5–5.1)
Sodium: 141 mmol/L (ref 135–145)
Total Bilirubin: 0.7 mg/dL (ref 0.3–1.2)
Total Protein: 6.8 g/dL (ref 6.5–8.1)

## 2022-07-02 LAB — GLUCOSE, CAPILLARY: Glucose-Capillary: 179 mg/dL — ABNORMAL HIGH (ref 70–99)

## 2022-07-02 MED ORDER — SODIUM CHLORIDE 0.9 % IV SOLN
1.0000 g | Freq: Once | INTRAVENOUS | Status: AC
  Administered 2022-07-02: 1000 mg via INTRAVENOUS
  Filled 2022-07-02: qty 1

## 2022-07-02 MED ORDER — POTASSIUM CHLORIDE CRYS ER 20 MEQ PO TBCR
EXTENDED_RELEASE_TABLET | ORAL | Status: AC
  Filled 2022-07-02: qty 2

## 2022-07-02 MED ORDER — POTASSIUM CHLORIDE CRYS ER 20 MEQ PO TBCR
40.0000 meq | EXTENDED_RELEASE_TABLET | Freq: Once | ORAL | Status: AC
  Administered 2022-07-02: 40 meq via ORAL

## 2022-07-02 NOTE — Progress Notes (Signed)
NAME: [name].  DOB: 1960-12-03  MRN: [id number]  Date/Time: 07/02/2022 11.30Am   Pt was getting ertapenem in day surgery and I saw him He was recently discharged from hospital after being treated for serratia bacteremia and UTI and is being treated with IV ertapenem at the day surgery every day until 07/05/22 for a total of 2 weeks He is having diarrhea and feeling weak No fever , but some chills No pain abdomen No nausea or vomiting But afraid to eat because of diarrhea- 6/day no blood ? [name]. is a 62 y.o. with a history of renal transplant, on tacrolimus, gastric sleeve surgery, cad S/p CABG, gout, diabetes mellitus, hypertension,HFpEF, pulmonary hypertension, BPH with urolift 12/25/21 on 3/8 fulguration of extensive anal condylomata was in [facility] between 06/21/22-06/28/22 for infection with serratia, transplant kidney lower pole pyelonephritis, serratia in blood and urine- this was resistant to oral quinolone and sulfa and while in the hospital he got cefepime and went home on IV ertapenem. He preferred to come to the ED every day to get IV antibiotic in order to avoid central line placement as he could not get PICC because of AVG both arms HE will get antibiotic for 14 days   Past Medical History:  Diagnosis Date   Anal condyloma    Anemia    Aortic atherosclerosis (HCC)    Aortic stenosis    a.) TTE 10/2021: Mod AS. AVA 1.06cm^2 (VTI). Mean grad ; b.) TTE 04/02/2022: no AV stenosis   Asthma, persistent controlled 02/25/2013   Attention or concentration deficit 04/26/2021   BPH with obstruction/lower urinary tract symptoms 09/25/2014   CAD (coronary artery disease) 2005   a.) LHC 1996 -> HG stenosis mLAD -> PTCA/PCI with stent x 1 (unk type) -> complicated by CFA pseudoanurysm (required surg);  b.) LHC 08/10/2002  -> 95% LAD, 70% ISR LAD, 80% pOM, 70% mRCA --> CVTS consult; c.) 4v CABG 08/13/2002; c.) 03/2018 MV: Small, fixed inferoapical and apical sep  defect. No isc. EF 47% (60-65% by 05/2018 TTE); d.) 02/2021 MV: small Apical inf fixed defect. No isc -> low risk.   Cardiomegaly    Cellulitis and abscess of left leg 07/22/2020   Chronic atrial fibrillation    a.) CHA2DS2VASc = 4 (CHF, HTN, vascular disease history, T2DM);  b.) rate/rhythm maintained without pharmacological intervention; chronically anticoagulated with apixaban   Chronic combined systolic and diastolic CHF (congestive heart failure)    a.) TTE 7/18 : EF 45-50%; b.) TTE 05/2018 : EF 60-65%, RVSP 74.8; c.) TTE 12/2018: EF 50-55%. Sev dil LA; d.) TTE 04/2019: EF 45-50%; e.) TTE 07/2021: EF 40-45%, G3DD; f.) TTE 10/2021: EF 40-45%, mild conc LVH, septal-lat dyssynchrony (LBBB), mildly red RVSF, mild-mod dil LA, mildly dil RA, mild-mod MS, mod AS; g.) TTE 04/02/2022: EF 40-45%, glob HK, LVH, red RVSF, RVE, sev LAE, mild-mod RAE, mild MR   Chronic venous stasis dermatitis of both lower extremities    Cytomegaloviral disease 2017   Diverticulosis of colon 04/22/2013   Elevated RIGHT hemidiaphragm    Erectile dysfunction    a.) on topical TRT + Trimix (alprostadil/papaverine/phentolamine) injections   ESRD (end stage renal disease) (HCC)    a.) dialysis dependent 2004-2012; b.) s/p cadaveric RIGHT renal transplant 09/13/2010   Essential hypertension 12/17/2010   GERD (gastroesophageal reflux disease)    Gout    History of 2019 novel coronavirus disease (COVID-19) 06/30/2019   History of bilateral cataract extraction 10/2017   History of renal  transplant 09/13/2010   a.) s/p cadaveric donor transplant 09/13/2010   Hx of bilateral cataract extraction 10/2017   Hyperlipidemia    Hypogonadism in male 09/25/2014   Ischemic cardiomyopathy    Left cephalic vein thrombosis 06/2019   Long term current use of anticoagulant    a.) apixaban   Long term current use of immunosuppressive drug    a.) mycophenolate + prednisone + tacrolimus   Major depressive disorder 10/04/2013   Mitral  stenosis 11/07/2021   a.) TTE 11/07/2021: severe MAC, mild-mod MS (MPG 6 mmHg); b.) TTE 04/02/2022: no MV stenosis   Murmur    Obesity hypoventilation syndrome 03/31/2012   OSA treated with BiPAP 09/29/2012   PAH (pulmonary artery hypertension)    a. 04/2019 RHC: RA 19, RV 80/20, PA 78/31 (51), PCWP 25, CO/CI 7.69/3.26. PVR 3.25 --> Sev PAH, likely primarily PV HTN; b.) RHC 04/04/2022: mRA 13, mPA 40, mPCWP 20, PA sat 59, AO sat 92, CO 7.63, CI 3.26, PVR 2.6 --> mod portal venous HTN   Pancreatitis    S/P CABG x 4 08/13/2002   a.) LIMA-LAD, LRA-OM, SVG-D1, SVG-dRCA   S/P gastric bypass    Synovitis of finger 06/11/2019   Trigger finger, unspecified little finger 12/23/2018   Type 2 diabetes mellitus with hyperglycemia, with long-term current use of insulin 09/09/2014   a.) has FreeStyle Libre CGM   Type 2 MI (myocardial infarction) (HCC)    Ulcer 05/2016   Left shin   Wears glasses     Past Surgical History:  Procedure Laterality Date   CARDIAC CATHETERIZATION N/A 08/10/2002   CATARACT EXTRACTION W/PHACO Right 10/29/2017   Procedure: CATARACT EXTRACTION PHACO AND INTRAOCULAR LENS PLACEMENT (IOC)  RIGHT DIABETIC;  Surgeon: Lockie Mola, MD;  Location: [facility];  Service: Ophthalmology;  Laterality: Right   CATARACT EXTRACTION W/PHACO Left 11/18/2017   Procedure: CATARACT EXTRACTION PHACO AND INTRAOCULAR LENS PLACEMENT (IOC) LEFT IVA/TOPICAL;  Surgeon: Lockie Mola, MD;  Location: [facility];  Service: Ophthalmology;  Laterality: Left   COLONOSCOPY WITH PROPOFOL N/A 04/22/2013   Procedure: COLONOSCOPY WITH PROPOFOL;  Surgeon: Rachael Fee, MD;  Location: [facility];  Service: Endoscopy;  Laterality: N/A;   CORONARY ANGIOPLASTY WITH STENT PLACEMENT N/A 1996   CORONARY ARTERY BYPASS GRAFT N/A 08/13/2002   Procedure: 4v CORONARY ARTERY BYPASS GRAFTING; Location: [facility]; Surgeon: Katy Apo, MD   CYSTOSCOPY WITH INSERTION OF UROLIFT N/A  12/25/2021   Procedure: CYSTOSCOPY WITH INSERTION OF UROLIFT;  Surgeon: Riki Altes, MD;  Location: [facility];  Service: Urology;  Laterality: N/A;   DG ANGIO AV SHUNT*L*     right and left upper arms   FASCIOTOMY  03/03/2012   Procedure: FASCIOTOMY;  Surgeon: Nicki Reaper, MD;  Location: [facility];  Service: Orthopedics;  Laterality: Right;  FASCIOTOMY RIGHT SMALL FINGER   FASCIOTOMY Left 08/17/2013   Procedure: FASCIOTOMY LEFT RING;  Surgeon: Nicki Reaper, MD;  Location: [facility];  Service: Orthopedics;  Laterality: Left;   INCISION AND DRAINAGE ABSCESS Left 10/15/2015   Procedure: INCISION AND DRAINAGE ABSCESS;  Surgeon: Leafy Ro, MD;  Location: [facility];  Service: General;  Laterality: Left;   KIDNEY TRANSPLANT  09/13/2010   cadaver--at [facility] HEART CATH N/A 11/15/2016   Procedure: RIGHT HEART CATH;  Surgeon: Iran Ouch, MD;  Location: [facility];  Service: Cardiovascular;  Laterality: N/A;   RIGHT HEART CATH N/A 05/03/2019   Procedure:  RIGHT HEART CATH;  Surgeon: Laurey MoraleMcLean, Dalton S, MD;  [facility];  Service: Cardiovascular;  Laterality: N/A;   RIGHT HEART CATH N/A 04/04/2022   Procedure: RIGHT HEART CATH;  Surgeon: Laurey MoraleMcLean, Dalton S, MD;  [facility];  Service: Cardiovascular;  Laterality: N/A;   ROUX-EN-Y GASTRIC BYPASS N/A 2015   TYMPANIC MEMBRANE REPAIR Left 03/2010   VASECTOMY     WART FULGURATION N/A 05/31/2022   Procedure: FULGURATION ANAL WART;  Surgeon: Sung AmabileSakai, Isami, DO;  Location: [facility];  Service: General;  Laterality: N/A;    Social History   Socioeconomic History   Marital status: Married    Spouse name: Not on file   Number of children: 2   Years of education: 14   Highest education level: Associate degree: academic program  Occupational History   Occupation: Presenter, broadcastingManufacturing manager/retired    Employer: unemployed    Comment: disabled due to kidney failure  Tobacco  Use   Smoking status: Former    Types: Cigars    Quit date: 09/02/1994    Years since quitting: 27.8    Passive exposure: Never   Smokeless tobacco: Never   Tobacco comments:    Occasional, rare cigars  Vaping Use   Vaping Use: Never used  Substance and Sexual Activity   Alcohol use: Not Currently    Comment: occ   Drug use: No   Sexual activity: Not on file  Other Topics Concern   Not on file  Social History Narrative   Has living will   Wife is health care POA   Would accept resuscitation attempts   No tube feedings if cognitively unaware   Social Determinants of Health   Financial Resource Strain: Not on file  Food Insecurity: No Food Insecurity (04/01/2022)   Hunger Vital Sign    Worried About Running Out of Food in the Last Year: Never true    Ran Out of Food in the Last Year: Never true  Transportation Needs: No Transportation Needs (04/01/2022)   PRAPARE - Administrator, Civil ServiceTransportation    Lack of Transportation (Medical): No    Lack of Transportation (Non-Medical): No  Physical Activity: Not on file  Stress: Not on file  Social Connections: Not on file  Intimate Partner Violence: Not At Risk (04/01/2022)   Humiliation, Afraid, Rape, and Kick questionnaire    Fear of Current or Ex-Partner: No    Emotionally Abused: No    Physically Abused: No    Sexually Abused: No    Family History  Problem Relation Age of Onset   Heart disease Father    Kidney failure Father    Kidney disease Father    Diabetes Maternal Grandmother    Breast cancer Maternal Grandmother    Valvular heart disease Mother    Liver cancer Paternal Uncle    Liver cancer Paternal Grandmother    Prostate cancer Neg Hx    Allergies  Allergen Reactions   Contrast Media [Iodinated Contrast Media] Other (See Comments)    Kidney transplant   Iodine Other (See Comments)    Other reaction(s): Other (See Comments) Kidney transplant   Ibuprofen Other (See Comments)    Due to kidney transplant Due to kidney  transplant Due to kidney transplant   I? Current Outpatient Medications  Medication Sig Dispense Refill   albuterol (PROVENTIL) (2.5 MG/3ML) 0.083% nebulizer solution Take 3 mLs (2.5 mg total) by nebulization every 6 (six) hours as needed for wheezing or shortness of breath. 150 mL  0   albuterol (VENTOLIN HFA) 108 (90 Base) MCG/ACT inhaler Inhale 2 puffs into the lungs every 6 (six) hours as needed for wheezing or shortness of breath. 6.7 g 5   AMBULATORY NON FORMULARY MEDICATION Trimix (30/1/10)-(Pap/Phent/PGE)  Test Dose  78ml vial   Qty #3 Refills 0  [facility] [phone] Fax [phone] 3 mL 0   apixaban (ELIQUIS) 5 MG TABS tablet TAKE 1 TABLET BY MOUTH TWICE DAILY (Patient taking differently: Take 5 mg by mouth 2 (two) times daily.) 180 tablet 1   beclomethasone (QVAR) 80 MCG/ACT inhaler Inhale 2 puffs into the lungs daily as needed (for shortness of breath).     buPROPion (WELLBUTRIN SR) 150 MG 12 hr tablet Take 1 tablet (150 mg total) by mouth 2 (two) times daily. 180 tablet 3   cholecalciferol (VITAMIN D3) 25 MCG (1000 UNIT) tablet Take 1,000 Units by mouth at bedtime. (Patient not taking: Reported on 06/21/2022)     colchicine 0.6 MG tablet Take 1 tablet (0.6 mg total) by mouth every other day. 45 tablet 1   Continuous Blood Gluc Sensor (FREESTYLE LIBRE 3 SENSOR) MISC Use one every 2 weeks. 6 each 3   cyanocobalamin 100 MCG tablet Take 100 mcg by mouth daily.     dapagliflozin propanediol (FARXIGA) 10 MG TABS tablet Take 1 tablet (10 mg total) by mouth daily before breakfast. 90 tablet 3   ertapenem 1,000 mg in sodium chloride 0.9 % 100 mL Inject 1,000 mg into the vein daily for 6 days.     febuxostat (ULORIC) 40 MG tablet Take 1 tablet (40 mg total) by mouth daily. 90 tablet 5   fluticasone (FLONASE) 50 MCG/ACT nasal spray PLACE 2 SPRAYS INTO BOTH NOSTRILS DAILY. 16 g 11   gabapentin (NEURONTIN) 300 MG capsule Take 1 capsule (300 mg total) by mouth 2 (two) times  daily. 180 capsule 3   hydrALAZINE (APRESOLINE) 25 MG tablet Take 1 tablet (25 mg total) by mouth every 8 (eight) hours. 90 tablet 1   hydrocortisone 2.5 % cream Apply topically 3 (three) times daily as needed. 30 g 3   insulin glargine-yfgn (SEMGLEE) 100 UNIT/ML Pen Inject 35 Units into the skin daily. (Patient taking differently: Inject 35 Units into the skin at bedtime.) 45 mL 3   insulin lispro (HUMALOG KWIKPEN) 100 UNIT/ML KwikPen Inject 10-20 Units into the skin 3 (three) times daily. Per sliding scale     Insulin Pen Needle (UNIFINE PENTIPS) 31G X 5 MM MISC Use 4 times daily as directed 400 each 3   iron polysaccharides (FERREX 150) 150 MG capsule Take 1 capsule (150 mg total) by mouth daily.     isosorbide mononitrate (IMDUR) 30 MG 24 hr tablet Take 1 tablet (30 mg total) by mouth daily. 30 tablet 2   lidocaine (LIDODERM) 5 % Place 1 patch onto the skin daily.PLACE 1 PATCH ONTO THE SKIN FOR 12 (TWELVE) HOURS. REMOVE & DISCARD PATCH WITHIN 12 HOURS OR AS DIRECTED BY MD 60 patch 11   lidocaine (XYLOCAINE) 5 % ointment Apply 1 Application topically 3 (three) times daily as needed. Apply pea size amount to area as needed for pain. 50 g 0   losartan (COZAAR) 50 MG tablet Take 50 mg by mouth daily.     metolazone (ZAROXOLYN) 2.5 MG tablet Take 1 tablet by mouth 3 times weekly. 15 tablet 3   montelukast (SINGULAIR) 10 MG tablet TAKE 1 TABLET BY MOUTH AT BEDTIME. (Patient taking differently: Take 10 mg by  mouth at bedtime.) 90 tablet 3   Multiple Vitamin (MULTIVITAMIN WITH MINERALS) TABS tablet Take 1 tablet by mouth daily.     mycophenolate (MYFORTIC) 180 MG EC tablet Take 2 tablets (360 mg total) by mouth 2 (two) times daily. 360 tablet 3   omeprazole (PRILOSEC) 20 MG capsule TAKE 1 CAPSULE BY MOUTH DAILY. (Patient taking differently: Take 20 mg by mouth at bedtime.) 90 capsule 3   potassium chloride (KLOR-CON) 10 MEQ tablet Take 4 tablets (40 mEq total) by mouth daily. 120 tablet 8   predniSONE  (DELTASONE) 5 MG tablet Take 1 tablet (5 mg total) by mouth daily. 90 tablet 3   rosuvastatin (CRESTOR) 10 MG tablet Take 1 tablet (10 mg total) by mouth daily. (Patient taking differently: Take 10 mg by mouth at bedtime.) 90 tablet 3   sulfamethoxazole-trimethoprim (BACTRIM) 400-80 MG tablet TAKE 1 TABLET BY MOUTH EVERY MONDAY, WEDNESDAY, FRIDAY. 36 tablet 3   tacrolimus (PROGRAF) 1 MG capsule Take 2 capsules (2 mg total) by mouth 2 times daily. 120 capsule 11   tamsulosin (FLOMAX) 0.4 MG CAPS capsule TAKE 1 CAPSULE BY MOUTH DAILY. (Patient taking differently: Take 0.4 mg by mouth daily after breakfast.) 90 capsule 3   Testosterone 10 MG/ACT (2%) GEL Apply 1 Pump topically daily to inner thighs - 2 pumps total. 60 g 0   torsemide (DEMADEX) 20 MG tablet Take 5 tablets (100 mg total) by mouth daily. 300 tablet 3   zinc sulfate 220 (50 Zn) MG capsule Take 220 mg by mouth daily.     No current facility-administered medications for this visit.     Abtx:  Anti-infectives (From admission, onward)    None       REVIEW OF SYSTEMS:  Const: negative fever, + chills, negative weight loss Eyes: negative diplopia or visual changes, negative eye pain ENT: negative coryza, negative sore throat Resp: negative cough, hemoptysis, dyspnea Cards: negative for chest pain, palpitations, lower extremity edema GU: negative for frequency, dysuria and hematuria GI: Negative for abdominal pain, ++diarrhea, no bleeding, constipation Skin: negative for rash and pruritus Heme: negative for easy bruising and gum/nose bleeding MS: weakness Neurolo:negative for headaches, dizziness, vertigo, memory problems  Psych: negative for feelings of anxiety, depression  Endocrine:diabetes Allergy/Immunology- as above Objective:  VITALS:  150/82, HR 62, Pulse ox 95% and Temp 97.6 PHYSICAL EXAM:  General: Alert, cooperative, no distress, appears stated age.  Lungs: Clear to auscultation bilaterally. No Wheezing or  Rhonchi. No rales. Heart: Regular rate and rhythm, no murmur, rub or gallop. Abdomen: Soft, non-tender,not distended. Bowel sounds normal. No masses Extremities: venous edema and venous stasis Skin: venous edema and venous stasis legs Lymph: Cervical, supraclavicular normal. Neurologic: Grossly non-focal Ambulated to the bathroom and was fine Pertinent Labs    Latest Ref Rng & Units 07/02/2022   10:24 AM 06/28/2022    6:05 AM 06/27/2022    4:19 AM  CBC  WBC 4.0 - 10.5 K/uL 6.4  4.8  4.0   Hemoglobin 13.0 - 17.0 g/dL 82.9  56.2  13.0   Hematocrit 39.0 - 52.0 % 36.6  35.7  33.8   Platelets 150 - 400 K/uL 138  135  121          Latest Ref Rng & Units 07/02/2022   10:24 AM 06/28/2022    6:05 AM 06/27/2022    4:19 AM  CMP  Glucose 70 - 99 mg/dL 865  784  696   BUN 8 - 23 mg/dL 83  93  100   Creatinine 0.61 - 1.24 mg/dL 1.61  0.96  0.45   Sodium 135 - 145 mmol/L 141  142  140   Potassium 3.5 - 5.1 mmol/L 3.0  3.5  3.5   Chloride 98 - 111 mmol/L 100  105  103   CO2 22 - 32 mmol/L 30  27  26    Calcium 8.9 - 10.3 mg/dL 8.4  9.2  9.1   Total Protein 6.5 - 8.1 g/dL 6.8     Total Bilirubin 0.3 - 1.2 mg/dL 0.7     Alkaline Phos 38 - 126 U/L 130     AST 15 - 41 U/L 22     ALT 0 - 44 U/L 16      ? Impression/Recommendation ? ?Serratia bacteremia  Serratia UTI Complicated UTI with transplant kidney lower pole fullness Pyelonephritis Will get ertapenem for a total of 14 days   Diarrhea Will check cdiff/gi panel Pt given cups and he will bring sample Asked him to drink water/gatorade   Hypokalemia- has had chronically and intermittently and takes KCL- gave 40 meq here today  Kidney transplant- on cellcept, mycophenolate- followed at [facility]- called them and left a message for hi nephrologist ( Deanna, Kris Hartmann  AKI on cKD- creatinen today 2.12 ( but than hospital-)  CHF  While in the hospital he got IV lasix as he had excess fluid   H/o BPH with  urolift ? ___________________________________________________ Discussed with patient, and day surgery nurses

## 2022-07-03 ENCOUNTER — Ambulatory Visit
Admission: RE | Admit: 2022-07-03 | Discharge: 2022-07-03 | Disposition: A | Discharge status: Home / Self Care | Payer: Commercial Managed Care - PPO | Source: Ambulatory Visit | Attending: Infectious Diseases | Admitting: Infectious Diseases

## 2022-07-03 ENCOUNTER — Encounter: Payer: Self-pay | Admitting: Infectious Diseases

## 2022-07-03 ENCOUNTER — Other Ambulatory Visit: Payer: Self-pay

## 2022-07-03 ENCOUNTER — Telehealth: Payer: Self-pay

## 2022-07-03 ENCOUNTER — Other Ambulatory Visit: Payer: Self-pay | Admitting: Infectious Diseases

## 2022-07-03 DIAGNOSIS — R197 Diarrhea, unspecified: Secondary | ICD-10-CM | POA: Diagnosis not present

## 2022-07-03 LAB — GASTROINTESTINAL PANEL BY PCR, STOOL (REPLACES STOOL CULTURE)
Adenovirus F40/41: NOT DETECTED
Astrovirus: NOT DETECTED
Campylobacter species: DETECTED — AB
Cryptosporidium: NOT DETECTED
Cyclospora cayetanensis: NOT DETECTED
Entamoeba histolytica: NOT DETECTED
Enteroaggregative E coli (EAEC): NOT DETECTED
Enteropathogenic E coli (EPEC): NOT DETECTED
Enterotoxigenic E coli (ETEC): NOT DETECTED
Giardia lamblia: NOT DETECTED
Norovirus GI/GII: NOT DETECTED
Plesimonas shigelloides: NOT DETECTED
Rotavirus A: NOT DETECTED
Salmonella species: NOT DETECTED
Sapovirus (I, II, IV, and V): NOT DETECTED
Shiga like toxin producing E coli (STEC): NOT DETECTED
Shigella/Enteroinvasive E coli (EIEC): NOT DETECTED
Vibrio cholerae: NOT DETECTED
Vibrio species: NOT DETECTED
Yersinia enterocolitica: NOT DETECTED

## 2022-07-03 LAB — C DIFFICILE QUICK SCREEN W PCR REFLEX
C Diff antigen: NEGATIVE
C Diff interpretation: NOT DETECTED
C Diff toxin: NEGATIVE

## 2022-07-03 MED ORDER — SODIUM CHLORIDE 0.9 % IV SOLN
1.0000 g | Freq: Once | INTRAVENOUS | Status: AC
  Administered 2022-07-03: 1000 mg via INTRAVENOUS
  Filled 2022-07-03: qty 1

## 2022-07-03 MED ORDER — AZITHROMYCIN 500 MG PO TABS: 500.0000 mg | ORAL_TABLET | Freq: Every day | ORAL | 0 refills | Status: DC

## 2022-07-03 MED FILL — Dapagliflozin Propanediol Tab 10 MG (Base Equivalent): ORAL | 30 days supply | Qty: 30 | Fill #3 | Status: AC

## 2022-07-03 NOTE — Telephone Encounter (Signed)
Created in error

## 2022-07-03 NOTE — Telephone Encounter (Signed)
Received call today from [facility] lab stating Gastrointestinal Panel by PCR , Stool detected Camplyobacter species. Dr. Rivka Safer will send in azithromycin prescription for 5 days. Left patient voicemail requesting call back. Juanita Laster, RMA

## 2022-07-03 NOTE — Progress Notes (Signed)
Campylobacter diarrhea- prescribed azithromycin for 5 days- asked not to take colchicine while on azithromycin

## 2022-07-04 ENCOUNTER — Other Ambulatory Visit: Payer: Self-pay

## 2022-07-04 ENCOUNTER — Ambulatory Visit
Admission: RE | Admit: 2022-07-04 | Discharge: 2022-07-04 | Disposition: A | Discharge status: Home / Self Care | Payer: Commercial Managed Care - PPO | Source: Ambulatory Visit | Attending: Infectious Diseases | Admitting: Infectious Diseases

## 2022-07-04 VITALS — BP 156/79 | HR 69 | Temp 97.8°F | Resp 16

## 2022-07-04 DIAGNOSIS — J811 Chronic pulmonary edema: Secondary | ICD-10-CM | POA: Diagnosis not present

## 2022-07-04 DIAGNOSIS — S2241XA Multiple fractures of ribs, right side, initial encounter for closed fracture: Secondary | ICD-10-CM | POA: Diagnosis not present

## 2022-07-04 DIAGNOSIS — I482 Chronic atrial fibrillation, unspecified: Secondary | ICD-10-CM | POA: Diagnosis not present

## 2022-07-04 DIAGNOSIS — I2721 Secondary pulmonary arterial hypertension: Secondary | ICD-10-CM | POA: Diagnosis not present

## 2022-07-04 DIAGNOSIS — G8929 Other chronic pain: Secondary | ICD-10-CM | POA: Diagnosis present

## 2022-07-04 DIAGNOSIS — E1122 Type 2 diabetes mellitus with diabetic chronic kidney disease: Secondary | ICD-10-CM | POA: Diagnosis present

## 2022-07-04 DIAGNOSIS — Z9884 Bariatric surgery status: Secondary | ICD-10-CM | POA: Diagnosis not present

## 2022-07-04 DIAGNOSIS — E785 Hyperlipidemia, unspecified: Secondary | ICD-10-CM | POA: Diagnosis present

## 2022-07-04 DIAGNOSIS — D84821 Immunodeficiency due to drugs: Secondary | ICD-10-CM | POA: Diagnosis not present

## 2022-07-04 DIAGNOSIS — R7881 Bacteremia: Secondary | ICD-10-CM

## 2022-07-04 DIAGNOSIS — Z8249 Family history of ischemic heart disease and other diseases of the circulatory system: Secondary | ICD-10-CM | POA: Diagnosis not present

## 2022-07-04 DIAGNOSIS — Y92017 Garden or yard in single-family (private) house as the place of occurrence of the external cause: Secondary | ICD-10-CM | POA: Diagnosis not present

## 2022-07-04 DIAGNOSIS — W010XXA Fall on same level from slipping, tripping and stumbling without subsequent striking against object, initial encounter: Secondary | ICD-10-CM | POA: Diagnosis present

## 2022-07-04 DIAGNOSIS — I5042 Chronic combined systolic (congestive) and diastolic (congestive) heart failure: Secondary | ICD-10-CM | POA: Diagnosis not present

## 2022-07-04 DIAGNOSIS — Z87891 Personal history of nicotine dependence: Secondary | ICD-10-CM | POA: Diagnosis not present

## 2022-07-04 DIAGNOSIS — Z951 Presence of aortocoronary bypass graft: Secondary | ICD-10-CM | POA: Diagnosis not present

## 2022-07-04 DIAGNOSIS — S27321A Contusion of lung, unilateral, initial encounter: Secondary | ICD-10-CM | POA: Diagnosis not present

## 2022-07-04 DIAGNOSIS — I130 Hypertensive heart and chronic kidney disease with heart failure and stage 1 through stage 4 chronic kidney disease, or unspecified chronic kidney disease: Secondary | ICD-10-CM | POA: Diagnosis not present

## 2022-07-04 DIAGNOSIS — Z043 Encounter for examination and observation following other accident: Secondary | ICD-10-CM | POA: Diagnosis not present

## 2022-07-04 DIAGNOSIS — Z940 Kidney transplant status: Secondary | ICD-10-CM | POA: Diagnosis not present

## 2022-07-04 DIAGNOSIS — N184 Chronic kidney disease, stage 4 (severe): Secondary | ICD-10-CM | POA: Diagnosis not present

## 2022-07-04 DIAGNOSIS — W19XXXA Unspecified fall, initial encounter: Secondary | ICD-10-CM | POA: Diagnosis not present

## 2022-07-04 DIAGNOSIS — Z8616 Personal history of COVID-19: Secondary | ICD-10-CM | POA: Diagnosis not present

## 2022-07-04 DIAGNOSIS — G4733 Obstructive sleep apnea (adult) (pediatric): Secondary | ICD-10-CM | POA: Diagnosis present

## 2022-07-04 DIAGNOSIS — E1165 Type 2 diabetes mellitus with hyperglycemia: Secondary | ICD-10-CM | POA: Diagnosis present

## 2022-07-04 DIAGNOSIS — S40011A Contusion of right shoulder, initial encounter: Secondary | ICD-10-CM | POA: Diagnosis not present

## 2022-07-04 DIAGNOSIS — Z6836 Body mass index (BMI) 36.0-36.9, adult: Secondary | ICD-10-CM | POA: Diagnosis not present

## 2022-07-04 DIAGNOSIS — S0990XA Unspecified injury of head, initial encounter: Secondary | ICD-10-CM | POA: Diagnosis not present

## 2022-07-04 DIAGNOSIS — R9431 Abnormal electrocardiogram [ECG] [EKG]: Secondary | ICD-10-CM | POA: Diagnosis not present

## 2022-07-04 DIAGNOSIS — Z7901 Long term (current) use of anticoagulants: Secondary | ICD-10-CM | POA: Diagnosis not present

## 2022-07-04 DIAGNOSIS — I2510 Atherosclerotic heart disease of native coronary artery without angina pectoris: Secondary | ICD-10-CM | POA: Diagnosis present

## 2022-07-04 DIAGNOSIS — I255 Ischemic cardiomyopathy: Secondary | ICD-10-CM | POA: Diagnosis present

## 2022-07-04 DIAGNOSIS — R0781 Pleurodynia: Secondary | ICD-10-CM | POA: Diagnosis not present

## 2022-07-04 LAB — COMPREHENSIVE METABOLIC PANEL
ALT: 20 U/L (ref 0–44)
AST: 33 U/L (ref 15–41)
Albumin: 3 g/dL — ABNORMAL LOW (ref 3.5–5.0)
Alkaline Phosphatase: 136 U/L — ABNORMAL HIGH (ref 38–126)
Anion gap: 12 (ref 5–15)
BUN: 78 mg/dL — ABNORMAL HIGH (ref 8–23)
CO2: 31 mmol/L (ref 22–32)
Calcium: 8.3 mg/dL — ABNORMAL LOW (ref 8.9–10.3)
Chloride: 97 mmol/L — ABNORMAL LOW (ref 98–111)
Creatinine, Ser: 2.82 mg/dL — ABNORMAL HIGH (ref 0.61–1.24)
GFR, Estimated: 25 mL/min — ABNORMAL LOW (ref >60)
Glucose, Bld: 113 mg/dL — ABNORMAL HIGH (ref 70–99)
Potassium: 3.1 mmol/L — ABNORMAL LOW (ref 3.5–5.1)
Sodium: 140 mmol/L (ref 135–145)
Total Bilirubin: 0.6 mg/dL (ref 0.3–1.2)
Total Protein: 6.9 g/dL (ref 6.5–8.1)

## 2022-07-04 LAB — CBC
HCT: 36.6 % — ABNORMAL LOW (ref 39.0–52.0)
Hemoglobin: 10.8 g/dL — ABNORMAL LOW (ref 13.0–17.0)
MCH: 23.5 pg — ABNORMAL LOW (ref 26.0–34.0)
MCHC: 29.5 g/dL — ABNORMAL LOW (ref 30.0–36.0)
MCV: 79.6 fL — ABNORMAL LOW (ref 80.0–100.0)
Platelets: 143 10*3/uL — ABNORMAL LOW (ref 150–400)
RBC: 4.6 MIL/uL (ref 4.22–5.81)
RDW: 19.8 % — ABNORMAL HIGH (ref 11.5–15.5)
WBC: 6.4 10*3/uL (ref 4.0–10.5)
nRBC: 0 % (ref 0.0–0.2)

## 2022-07-04 MED ORDER — INSULIN GLARGINE-YFGN 100 UNIT/ML ~~LOC~~ SOPN
40.0000 [IU] | PEN_INJECTOR | Freq: Every day | SUBCUTANEOUS | 3 refills | Status: DC
  Filled 2022-07-04: qty 30, 75d supply, fill #0
  Filled 2022-09-19: qty 30, 75d supply, fill #1
  Filled 2023-01-15: qty 30, 75d supply, fill #2

## 2022-07-04 MED ORDER — TAMSULOSIN HCL 0.4 MG PO CAPS
0.4000 mg | ORAL_CAPSULE | Freq: Every day | ORAL | 3 refills | Status: DC
  Filled 2022-07-04: qty 90, 90d supply, fill #0
  Filled 2022-10-31: qty 90, 90d supply, fill #1

## 2022-07-04 MED ORDER — SODIUM CHLORIDE 0.9 % IV SOLN
1.0000 g | Freq: Once | INTRAVENOUS | Status: AC
  Administered 2022-07-04: 1000 mg via INTRAVENOUS
  Filled 2022-07-04: qty 1

## 2022-07-04 MED ORDER — AZITHROMYCIN 500 MG PO TABS
500.0000 mg | ORAL_TABLET | Freq: Every day | ORAL | 0 refills | Status: DC
  Filled 2022-07-04: qty 5, 5d supply, fill #0

## 2022-07-04 NOTE — Telephone Encounter (Signed)
Spoke with patient regarding lab results and prescription. Understands that he will need to pick up azithromycin and take one tab for 5 days. Patient would like to ask provider questions about results. Will forward message to provider to call patient. Juanita Laster, RMA

## 2022-07-05 ENCOUNTER — Other Ambulatory Visit: Payer: Self-pay

## 2022-07-05 ENCOUNTER — Ambulatory Visit
Admission: RE | Admit: 2022-07-05 | Discharge: 2022-07-05 | Disposition: A | Discharge status: Home / Self Care | Payer: Commercial Managed Care - PPO | Source: Ambulatory Visit | Attending: Infectious Diseases | Admitting: Infectious Diseases

## 2022-07-05 DIAGNOSIS — B9689 Other specified bacterial agents as the cause of diseases classified elsewhere: Secondary | ICD-10-CM | POA: Insufficient documentation

## 2022-07-05 DIAGNOSIS — N390 Urinary tract infection, site not specified: Secondary | ICD-10-CM | POA: Insufficient documentation

## 2022-07-05 MED ORDER — ERTAPENEM SODIUM 1 G IJ SOLR: 1.0000 g | INTRAMUSCULAR | Status: DC

## 2022-07-05 MED ORDER — SODIUM CHLORIDE 0.9 % IV SOLN
1.0000 g | Freq: Once | INTRAVENOUS | Status: AC
  Administered 2022-07-05: 1000 mg via INTRAVENOUS
  Filled 2022-07-05: qty 1

## 2022-07-06 ENCOUNTER — Emergency Department: Payer: Commercial Managed Care - PPO

## 2022-07-06 ENCOUNTER — Other Ambulatory Visit: Payer: Self-pay

## 2022-07-06 ENCOUNTER — Inpatient Hospital Stay
Admission: EM | Admit: 2022-07-06 | Discharge: 2022-07-08 | DRG: 184 | Disposition: A | Discharge status: Home / Self Care | Payer: Commercial Managed Care - PPO | Attending: Internal Medicine | Admitting: Internal Medicine

## 2022-07-06 DIAGNOSIS — Z7901 Long term (current) use of anticoagulants: Secondary | ICD-10-CM

## 2022-07-06 DIAGNOSIS — Z87898 Personal history of other specified conditions: Secondary | ICD-10-CM

## 2022-07-06 DIAGNOSIS — E1165 Type 2 diabetes mellitus with hyperglycemia: Secondary | ICD-10-CM

## 2022-07-06 DIAGNOSIS — Z6836 Body mass index (BMI) 36.0-36.9, adult: Secondary | ICD-10-CM

## 2022-07-06 DIAGNOSIS — I5022 Chronic systolic (congestive) heart failure: Secondary | ICD-10-CM | POA: Diagnosis present

## 2022-07-06 DIAGNOSIS — R9431 Abnormal electrocardiogram [ECG] [EKG]: Secondary | ICD-10-CM | POA: Diagnosis not present

## 2022-07-06 DIAGNOSIS — Z87891 Personal history of nicotine dependence: Secondary | ICD-10-CM

## 2022-07-06 DIAGNOSIS — D84821 Immunodeficiency due to drugs: Secondary | ICD-10-CM | POA: Diagnosis present

## 2022-07-06 DIAGNOSIS — Z833 Family history of diabetes mellitus: Secondary | ICD-10-CM

## 2022-07-06 DIAGNOSIS — Z951 Presence of aortocoronary bypass graft: Secondary | ICD-10-CM

## 2022-07-06 DIAGNOSIS — Z79621 Long term (current) use of calcineurin inhibitor: Secondary | ICD-10-CM

## 2022-07-06 DIAGNOSIS — N400 Enlarged prostate without lower urinary tract symptoms: Secondary | ICD-10-CM | POA: Diagnosis present

## 2022-07-06 DIAGNOSIS — N184 Chronic kidney disease, stage 4 (severe): Secondary | ICD-10-CM

## 2022-07-06 DIAGNOSIS — R0781 Pleurodynia: Secondary | ICD-10-CM | POA: Diagnosis not present

## 2022-07-06 DIAGNOSIS — I5042 Chronic combined systolic (congestive) and diastolic (congestive) heart failure: Secondary | ICD-10-CM | POA: Diagnosis present

## 2022-07-06 DIAGNOSIS — E785 Hyperlipidemia, unspecified: Secondary | ICD-10-CM | POA: Diagnosis present

## 2022-07-06 DIAGNOSIS — I482 Chronic atrial fibrillation, unspecified: Secondary | ICD-10-CM | POA: Diagnosis present

## 2022-07-06 DIAGNOSIS — J811 Chronic pulmonary edema: Secondary | ICD-10-CM | POA: Diagnosis not present

## 2022-07-06 DIAGNOSIS — I130 Hypertensive heart and chronic kidney disease with heart failure and stage 1 through stage 4 chronic kidney disease, or unspecified chronic kidney disease: Secondary | ICD-10-CM | POA: Diagnosis present

## 2022-07-06 DIAGNOSIS — I2510 Atherosclerotic heart disease of native coronary artery without angina pectoris: Secondary | ICD-10-CM | POA: Diagnosis present

## 2022-07-06 DIAGNOSIS — Z841 Family history of disorders of kidney and ureter: Secondary | ICD-10-CM

## 2022-07-06 DIAGNOSIS — Z800 Family history of malignant neoplasm of digestive organs: Secondary | ICD-10-CM

## 2022-07-06 DIAGNOSIS — S2241XA Multiple fractures of ribs, right side, initial encounter for closed fracture: Secondary | ICD-10-CM | POA: Diagnosis not present

## 2022-07-06 DIAGNOSIS — S0990XA Unspecified injury of head, initial encounter: Secondary | ICD-10-CM | POA: Diagnosis not present

## 2022-07-06 DIAGNOSIS — Y92017 Garden or yard in single-family (private) house as the place of occurrence of the external cause: Secondary | ICD-10-CM

## 2022-07-06 DIAGNOSIS — Z803 Family history of malignant neoplasm of breast: Secondary | ICD-10-CM

## 2022-07-06 DIAGNOSIS — Z955 Presence of coronary angioplasty implant and graft: Secondary | ICD-10-CM

## 2022-07-06 DIAGNOSIS — I255 Ischemic cardiomyopathy: Secondary | ICD-10-CM | POA: Diagnosis present

## 2022-07-06 DIAGNOSIS — S27321A Contusion of lung, unilateral, initial encounter: Secondary | ICD-10-CM | POA: Diagnosis present

## 2022-07-06 DIAGNOSIS — Z8616 Personal history of COVID-19: Secondary | ICD-10-CM

## 2022-07-06 DIAGNOSIS — A045 Campylobacter enteritis: Secondary | ICD-10-CM

## 2022-07-06 DIAGNOSIS — W010XXA Fall on same level from slipping, tripping and stumbling without subsequent striking against object, initial encounter: Secondary | ICD-10-CM | POA: Diagnosis present

## 2022-07-06 DIAGNOSIS — Z8249 Family history of ischemic heart disease and other diseases of the circulatory system: Secondary | ICD-10-CM

## 2022-07-06 DIAGNOSIS — I2721 Secondary pulmonary arterial hypertension: Secondary | ICD-10-CM | POA: Diagnosis present

## 2022-07-06 DIAGNOSIS — E66813 Obesity, class 3: Secondary | ICD-10-CM | POA: Diagnosis present

## 2022-07-06 DIAGNOSIS — I10 Essential (primary) hypertension: Secondary | ICD-10-CM | POA: Diagnosis present

## 2022-07-06 DIAGNOSIS — I252 Old myocardial infarction: Secondary | ICD-10-CM

## 2022-07-06 DIAGNOSIS — Z7951 Long term (current) use of inhaled steroids: Secondary | ICD-10-CM

## 2022-07-06 DIAGNOSIS — G4733 Obstructive sleep apnea (adult) (pediatric): Secondary | ICD-10-CM | POA: Diagnosis present

## 2022-07-06 DIAGNOSIS — R0902 Hypoxemia: Secondary | ICD-10-CM | POA: Diagnosis present

## 2022-07-06 DIAGNOSIS — Z79899 Other long term (current) drug therapy: Secondary | ICD-10-CM

## 2022-07-06 DIAGNOSIS — Z9884 Bariatric surgery status: Secondary | ICD-10-CM

## 2022-07-06 DIAGNOSIS — D849 Immunodeficiency, unspecified: Secondary | ICD-10-CM | POA: Diagnosis present

## 2022-07-06 DIAGNOSIS — E1122 Type 2 diabetes mellitus with diabetic chronic kidney disease: Secondary | ICD-10-CM | POA: Diagnosis present

## 2022-07-06 DIAGNOSIS — S40011A Contusion of right shoulder, initial encounter: Secondary | ICD-10-CM | POA: Diagnosis not present

## 2022-07-06 DIAGNOSIS — W19XXXA Unspecified fall, initial encounter: Secondary | ICD-10-CM

## 2022-07-06 DIAGNOSIS — Z940 Kidney transplant status: Secondary | ICD-10-CM

## 2022-07-06 DIAGNOSIS — E669 Obesity, unspecified: Secondary | ICD-10-CM | POA: Diagnosis present

## 2022-07-06 DIAGNOSIS — G8929 Other chronic pain: Secondary | ICD-10-CM | POA: Diagnosis present

## 2022-07-06 LAB — CBC
HCT: 38.8 % — ABNORMAL LOW (ref 39.0–52.0)
Hemoglobin: 11.5 g/dL — ABNORMAL LOW (ref 13.0–17.0)
MCH: 23.7 pg — ABNORMAL LOW (ref 26.0–34.0)
MCHC: 29.6 g/dL — ABNORMAL LOW (ref 30.0–36.0)
MCV: 79.8 fL — ABNORMAL LOW (ref 80.0–100.0)
Platelets: 179 10*3/uL (ref 150–400)
RBC: 4.86 MIL/uL (ref 4.22–5.81)
RDW: 19.8 % — ABNORMAL HIGH (ref 11.5–15.5)
WBC: 7.4 10*3/uL (ref 4.0–10.5)
nRBC: 0 % (ref 0.0–0.2)

## 2022-07-06 LAB — BASIC METABOLIC PANEL
Anion gap: 12 (ref 5–15)
BUN: 73 mg/dL — ABNORMAL HIGH (ref 8–23)
CO2: 32 mmol/L (ref 22–32)
Calcium: 8.9 mg/dL (ref 8.9–10.3)
Chloride: 94 mmol/L — ABNORMAL LOW (ref 98–111)
Creatinine, Ser: 2.64 mg/dL — ABNORMAL HIGH (ref 0.61–1.24)
GFR, Estimated: 27 mL/min — ABNORMAL LOW (ref >60)
Glucose, Bld: 285 mg/dL — ABNORMAL HIGH (ref 70–99)
Potassium: 4.3 mmol/L (ref 3.5–5.1)
Sodium: 138 mmol/L (ref 135–145)

## 2022-07-06 LAB — CBG MONITORING, ED: Glucose-Capillary: 282 mg/dL — ABNORMAL HIGH (ref 70–99)

## 2022-07-06 NOTE — ED Triage Notes (Signed)
Pt has been having weakness x 2-3 days and was mowing today and fell on right side. Pt now complaining of right chest and rib pain. Pt is on Eliquis for A-Fib. Unsure if he lost conscuisness. Pt recently finished IV antibiotics for sepsis.

## 2022-07-07 ENCOUNTER — Emergency Department: Payer: Commercial Managed Care - PPO

## 2022-07-07 ENCOUNTER — Encounter: Payer: Self-pay | Admitting: Internal Medicine

## 2022-07-07 DIAGNOSIS — Z940 Kidney transplant status: Secondary | ICD-10-CM | POA: Diagnosis not present

## 2022-07-07 DIAGNOSIS — Z87891 Personal history of nicotine dependence: Secondary | ICD-10-CM | POA: Diagnosis not present

## 2022-07-07 DIAGNOSIS — S2241XA Multiple fractures of ribs, right side, initial encounter for closed fracture: Secondary | ICD-10-CM | POA: Diagnosis present

## 2022-07-07 DIAGNOSIS — R0781 Pleurodynia: Secondary | ICD-10-CM | POA: Diagnosis present

## 2022-07-07 DIAGNOSIS — Z951 Presence of aortocoronary bypass graft: Secondary | ICD-10-CM | POA: Diagnosis not present

## 2022-07-07 DIAGNOSIS — I2721 Secondary pulmonary arterial hypertension: Secondary | ICD-10-CM | POA: Diagnosis present

## 2022-07-07 DIAGNOSIS — W010XXA Fall on same level from slipping, tripping and stumbling without subsequent striking against object, initial encounter: Secondary | ICD-10-CM | POA: Diagnosis present

## 2022-07-07 DIAGNOSIS — Z7901 Long term (current) use of anticoagulants: Secondary | ICD-10-CM | POA: Diagnosis not present

## 2022-07-07 DIAGNOSIS — A045 Campylobacter enteritis: Secondary | ICD-10-CM

## 2022-07-07 DIAGNOSIS — I2510 Atherosclerotic heart disease of native coronary artery without angina pectoris: Secondary | ICD-10-CM | POA: Diagnosis present

## 2022-07-07 DIAGNOSIS — Z8616 Personal history of COVID-19: Secondary | ICD-10-CM | POA: Diagnosis not present

## 2022-07-07 DIAGNOSIS — E1122 Type 2 diabetes mellitus with diabetic chronic kidney disease: Secondary | ICD-10-CM | POA: Diagnosis present

## 2022-07-07 DIAGNOSIS — I5042 Chronic combined systolic (congestive) and diastolic (congestive) heart failure: Secondary | ICD-10-CM | POA: Diagnosis present

## 2022-07-07 DIAGNOSIS — I255 Ischemic cardiomyopathy: Secondary | ICD-10-CM | POA: Diagnosis present

## 2022-07-07 DIAGNOSIS — Z9884 Bariatric surgery status: Secondary | ICD-10-CM | POA: Diagnosis not present

## 2022-07-07 DIAGNOSIS — S27321A Contusion of lung, unilateral, initial encounter: Secondary | ICD-10-CM | POA: Diagnosis present

## 2022-07-07 DIAGNOSIS — Z87898 Personal history of other specified conditions: Secondary | ICD-10-CM

## 2022-07-07 DIAGNOSIS — Y92017 Garden or yard in single-family (private) house as the place of occurrence of the external cause: Secondary | ICD-10-CM | POA: Diagnosis not present

## 2022-07-07 DIAGNOSIS — W19XXXA Unspecified fall, initial encounter: Secondary | ICD-10-CM | POA: Diagnosis not present

## 2022-07-07 DIAGNOSIS — Z6836 Body mass index (BMI) 36.0-36.9, adult: Secondary | ICD-10-CM | POA: Diagnosis not present

## 2022-07-07 DIAGNOSIS — I130 Hypertensive heart and chronic kidney disease with heart failure and stage 1 through stage 4 chronic kidney disease, or unspecified chronic kidney disease: Secondary | ICD-10-CM | POA: Diagnosis present

## 2022-07-07 DIAGNOSIS — Z043 Encounter for examination and observation following other accident: Secondary | ICD-10-CM | POA: Diagnosis not present

## 2022-07-07 DIAGNOSIS — G8929 Other chronic pain: Secondary | ICD-10-CM | POA: Diagnosis present

## 2022-07-07 DIAGNOSIS — N184 Chronic kidney disease, stage 4 (severe): Secondary | ICD-10-CM

## 2022-07-07 DIAGNOSIS — I482 Chronic atrial fibrillation, unspecified: Secondary | ICD-10-CM | POA: Diagnosis present

## 2022-07-07 DIAGNOSIS — E785 Hyperlipidemia, unspecified: Secondary | ICD-10-CM | POA: Diagnosis present

## 2022-07-07 DIAGNOSIS — J811 Chronic pulmonary edema: Secondary | ICD-10-CM | POA: Diagnosis not present

## 2022-07-07 DIAGNOSIS — Z8249 Family history of ischemic heart disease and other diseases of the circulatory system: Secondary | ICD-10-CM | POA: Diagnosis not present

## 2022-07-07 DIAGNOSIS — G4733 Obstructive sleep apnea (adult) (pediatric): Secondary | ICD-10-CM | POA: Diagnosis present

## 2022-07-07 DIAGNOSIS — E1165 Type 2 diabetes mellitus with hyperglycemia: Secondary | ICD-10-CM | POA: Diagnosis present

## 2022-07-07 DIAGNOSIS — D84821 Immunodeficiency due to drugs: Secondary | ICD-10-CM | POA: Diagnosis present

## 2022-07-07 LAB — BASIC METABOLIC PANEL
Anion gap: 11 (ref 5–15)
BUN: 69 mg/dL — ABNORMAL HIGH (ref 8–23)
CO2: 35 mmol/L — ABNORMAL HIGH (ref 22–32)
Calcium: 8.8 mg/dL — ABNORMAL LOW (ref 8.9–10.3)
Chloride: 97 mmol/L — ABNORMAL LOW (ref 98–111)
Creatinine, Ser: 2.54 mg/dL — ABNORMAL HIGH (ref 0.61–1.24)
GFR, Estimated: 28 mL/min — ABNORMAL LOW (ref >60)
Glucose, Bld: 149 mg/dL — ABNORMAL HIGH (ref 70–99)
Potassium: 3.8 mmol/L (ref 3.5–5.1)
Sodium: 143 mmol/L (ref 135–145)

## 2022-07-07 LAB — CBC
HCT: 36.1 % — ABNORMAL LOW (ref 39.0–52.0)
Hemoglobin: 10.5 g/dL — ABNORMAL LOW (ref 13.0–17.0)
MCH: 23.7 pg — ABNORMAL LOW (ref 26.0–34.0)
MCHC: 29.1 g/dL — ABNORMAL LOW (ref 30.0–36.0)
MCV: 81.5 fL (ref 80.0–100.0)
Platelets: 169 10*3/uL (ref 150–400)
RBC: 4.43 MIL/uL (ref 4.22–5.81)
RDW: 19.7 % — ABNORMAL HIGH (ref 11.5–15.5)
WBC: 6.2 10*3/uL (ref 4.0–10.5)
nRBC: 0 % (ref 0.0–0.2)

## 2022-07-07 LAB — HEPATIC FUNCTION PANEL
ALT: 33 U/L (ref 0–44)
AST: 49 U/L — ABNORMAL HIGH (ref 15–41)
Albumin: 3.5 g/dL (ref 3.5–5.0)
Alkaline Phosphatase: 160 U/L — ABNORMAL HIGH (ref 38–126)
Bilirubin, Direct: 0.1 mg/dL (ref 0.0–0.2)
Indirect Bilirubin: 0.6 mg/dL (ref 0.3–0.9)
Total Bilirubin: 0.7 mg/dL (ref 0.3–1.2)
Total Protein: 7.7 g/dL (ref 6.5–8.1)

## 2022-07-07 LAB — GLUCOSE, CAPILLARY: Glucose-Capillary: 279 mg/dL — ABNORMAL HIGH (ref 70–99)

## 2022-07-07 LAB — CBG MONITORING, ED
Glucose-Capillary: 155 mg/dL — ABNORMAL HIGH (ref 70–99)
Glucose-Capillary: 152 mg/dL — ABNORMAL HIGH (ref 70–99)
Glucose-Capillary: 169 mg/dL — ABNORMAL HIGH (ref 70–99)
Glucose-Capillary: 202 mg/dL — ABNORMAL HIGH (ref 70–99)

## 2022-07-07 MED ORDER — ACETAMINOPHEN 325 MG PO TABS: 650.0000 mg | ORAL_TABLET | Freq: Four times a day (QID) | ORAL | Status: DC | PRN

## 2022-07-07 MED ORDER — MORPHINE SULFATE (PF) 4 MG/ML IV SOLN
4.0000 mg | Freq: Once | INTRAVENOUS | Status: AC
  Administered 2022-07-07: 4 mg via INTRAVENOUS
  Filled 2022-07-07: qty 1

## 2022-07-07 MED ORDER — ROSUVASTATIN CALCIUM 10 MG PO TABS
10.0000 mg | ORAL_TABLET | Freq: Every day | ORAL | Status: DC
  Administered 2022-07-07: 10 mg via ORAL
  Filled 2022-07-07: qty 1

## 2022-07-07 MED ORDER — GABAPENTIN 300 MG PO CAPS
300.0000 mg | ORAL_CAPSULE | Freq: Three times a day (TID) | ORAL | Status: DC
  Administered 2022-07-07 – 2022-07-08 (×3): 300 mg via ORAL
  Filled 2022-07-07 (×3): qty 1

## 2022-07-07 MED ORDER — MAGNESIUM SULFATE IN D5W 1-5 GM/100ML-% IV SOLN
1.0000 g | Freq: Once | INTRAVENOUS | Status: AC
  Administered 2022-07-07: 1 g via INTRAVENOUS
  Filled 2022-07-07: qty 100

## 2022-07-07 MED ORDER — MAGNESIUM SULFATE 50 % IJ SOLN
1.0000 g | Freq: Once | INTRAMUSCULAR | Status: DC
  Filled 2022-07-07: qty 2

## 2022-07-07 MED ORDER — OXYCODONE HCL 5 MG PO TABS
5.0000 mg | ORAL_TABLET | ORAL | Status: DC | PRN
  Administered 2022-07-07 – 2022-07-08 (×2): 5 mg via ORAL
  Filled 2022-07-07 (×2): qty 1

## 2022-07-07 MED ORDER — HYDROMORPHONE HCL 1 MG/ML IJ SOLN
0.5000 mg | INTRAMUSCULAR | Status: DC | PRN
  Administered 2022-07-07: 1 mg via INTRAVENOUS
  Filled 2022-07-07: qty 1

## 2022-07-07 MED ORDER — COLCHICINE 0.6 MG PO TABS: 0.6000 mg | ORAL_TABLET | ORAL | Status: DC

## 2022-07-07 MED ORDER — BUPROPION HCL ER (SR) 150 MG PO TB12
150.0000 mg | ORAL_TABLET | Freq: Two times a day (BID) | ORAL | Status: DC
  Administered 2022-07-07 – 2022-07-08 (×3): 150 mg via ORAL
  Filled 2022-07-07 (×3): qty 1

## 2022-07-07 MED ORDER — TACROLIMUS 1 MG PO CAPS
2.0000 mg | ORAL_CAPSULE | Freq: Two times a day (BID) | ORAL | Status: DC
  Administered 2022-07-07 – 2022-07-08 (×3): 2 mg via ORAL
  Filled 2022-07-07 (×3): qty 2

## 2022-07-07 MED ORDER — METOLAZONE 2.5 MG PO TABS
2.5000 mg | ORAL_TABLET | ORAL | Status: DC
  Administered 2022-07-08: 2.5 mg via ORAL
  Filled 2022-07-07: qty 1

## 2022-07-07 MED ORDER — LOSARTAN POTASSIUM 50 MG PO TABS
50.0000 mg | ORAL_TABLET | Freq: Every day | ORAL | Status: DC
  Administered 2022-07-07 – 2022-07-08 (×2): 50 mg via ORAL
  Filled 2022-07-07 (×2): qty 1

## 2022-07-07 MED ORDER — HYDROCODONE-ACETAMINOPHEN 5-325 MG PO TABS: 1.0000 | ORAL_TABLET | ORAL | Status: DC | PRN

## 2022-07-07 MED ORDER — ALBUTEROL SULFATE (2.5 MG/3ML) 0.083% IN NEBU: 2.5000 mg | INHALATION_SOLUTION | Freq: Four times a day (QID) | RESPIRATORY_TRACT | Status: DC | PRN

## 2022-07-07 MED ORDER — ISOSORBIDE MONONITRATE ER 30 MG PO TB24
30.0000 mg | ORAL_TABLET | Freq: Every day | ORAL | Status: DC
  Administered 2022-07-07 – 2022-07-08 (×2): 30 mg via ORAL
  Filled 2022-07-07 (×2): qty 1

## 2022-07-07 MED ORDER — TAMSULOSIN HCL 0.4 MG PO CAPS
0.4000 mg | ORAL_CAPSULE | Freq: Every day | ORAL | Status: DC
  Administered 2022-07-07 – 2022-07-08 (×2): 0.4 mg via ORAL
  Filled 2022-07-07 (×2): qty 1

## 2022-07-07 MED ORDER — ACETAMINOPHEN 500 MG PO TABS
1000.0000 mg | ORAL_TABLET | Freq: Four times a day (QID) | ORAL | Status: DC
  Administered 2022-07-07 – 2022-07-08 (×4): 1000 mg via ORAL
  Filled 2022-07-07 (×4): qty 2

## 2022-07-07 MED ORDER — INSULIN ASPART 100 UNIT/ML IJ SOLN
0.0000 [IU] | Freq: Every day | INTRAMUSCULAR | Status: DC
  Administered 2022-07-07: 3 [IU] via SUBCUTANEOUS
  Filled 2022-07-07: qty 1

## 2022-07-07 MED ORDER — POTASSIUM CHLORIDE CRYS ER 20 MEQ PO TBCR
40.0000 meq | EXTENDED_RELEASE_TABLET | Freq: Every day | ORAL | Status: DC
  Administered 2022-07-07 – 2022-07-08 (×2): 40 meq via ORAL
  Filled 2022-07-07 (×2): qty 2

## 2022-07-07 MED ORDER — PREDNISONE 10 MG PO TABS
5.0000 mg | ORAL_TABLET | Freq: Every day | ORAL | Status: DC
  Administered 2022-07-07 – 2022-07-08 (×2): 5 mg via ORAL
  Filled 2022-07-07 (×2): qty 1

## 2022-07-07 MED ORDER — ACETAMINOPHEN 650 MG RE SUPP: 650.0000 mg | Freq: Four times a day (QID) | RECTAL | Status: DC | PRN

## 2022-07-07 MED ORDER — ONDANSETRON HCL 4 MG/2ML IJ SOLN
4.0000 mg | INTRAMUSCULAR | Status: AC
  Administered 2022-07-07: 4 mg via INTRAVENOUS
  Filled 2022-07-07: qty 2

## 2022-07-07 MED ORDER — GABAPENTIN 300 MG PO CAPS
300.0000 mg | ORAL_CAPSULE | Freq: Two times a day (BID) | ORAL | Status: DC
  Administered 2022-07-07: 300 mg via ORAL
  Filled 2022-07-07: qty 1

## 2022-07-07 MED ORDER — HYDRALAZINE HCL 25 MG PO TABS
25.0000 mg | ORAL_TABLET | Freq: Three times a day (TID) | ORAL | Status: DC
  Administered 2022-07-07 – 2022-07-08 (×4): 25 mg via ORAL
  Filled 2022-07-07 (×4): qty 1

## 2022-07-07 MED ORDER — INSULIN GLARGINE-YFGN 100 UNIT/ML ~~LOC~~ SOLN
35.0000 [IU] | Freq: Every day | SUBCUTANEOUS | Status: DC
  Filled 2022-07-07 (×2): qty 0.35

## 2022-07-07 MED ORDER — ONDANSETRON HCL 4 MG/2ML IJ SOLN: 4.0000 mg | Freq: Four times a day (QID) | INTRAMUSCULAR | Status: DC | PRN

## 2022-07-07 MED ORDER — INSULIN GLARGINE-YFGN 100 UNIT/ML ~~LOC~~ SOLN
35.0000 [IU] | Freq: Every day | SUBCUTANEOUS | Status: DC
  Administered 2022-07-07: 35 [IU] via SUBCUTANEOUS
  Filled 2022-07-07: qty 0.35

## 2022-07-07 MED ORDER — INSULIN ASPART 100 UNIT/ML IJ SOLN
0.0000 [IU] | Freq: Three times a day (TID) | INTRAMUSCULAR | Status: DC
  Administered 2022-07-07 (×2): 4 [IU] via SUBCUTANEOUS
  Administered 2022-07-07: 7 [IU] via SUBCUTANEOUS
  Filled 2022-07-07 (×3): qty 1

## 2022-07-07 MED ORDER — PANTOPRAZOLE SODIUM 40 MG PO TBEC
40.0000 mg | DELAYED_RELEASE_TABLET | Freq: Every day | ORAL | Status: DC
  Administered 2022-07-07 – 2022-07-08 (×2): 40 mg via ORAL
  Filled 2022-07-07 (×2): qty 1

## 2022-07-07 MED ORDER — CYCLOBENZAPRINE HCL 10 MG PO TABS
5.0000 mg | ORAL_TABLET | Freq: Three times a day (TID) | ORAL | Status: DC
  Administered 2022-07-07 – 2022-07-08 (×3): 5 mg via ORAL
  Filled 2022-07-07 (×3): qty 1

## 2022-07-07 MED ORDER — MYCOPHENOLATE SODIUM 180 MG PO TBEC
360.0000 mg | DELAYED_RELEASE_TABLET | Freq: Two times a day (BID) | ORAL | Status: DC
  Administered 2022-07-07 – 2022-07-08 (×3): 360 mg via ORAL
  Filled 2022-07-07 (×3): qty 2

## 2022-07-07 MED ORDER — ONDANSETRON HCL 4 MG PO TABS: 4.0000 mg | ORAL_TABLET | Freq: Four times a day (QID) | ORAL | Status: DC | PRN

## 2022-07-07 NOTE — Assessment & Plan Note (Signed)
Slightly elevated, likely from pain Continue home antihypertensives

## 2022-07-07 NOTE — H&P (Signed)
History and Physical    Patient: [name]. ZOX:[id number] DOB: [birth year] DOA: 07/06/2022 DOS: the patient was seen and examined on 07/07/2022 PCP: [name], MD  Patient coming from: Home  Chief Complaint:  Chief Complaint  Patient presents with   Weakness    HPI: [name]. is a 62 y.o. male with medical history significant for CAD s/p CABG in 2005, ischemic cardiomyopathy (EF 40 to 45% 03/2022), chronic A. Fib on Eliquis, aortic stenosis, HTN, type 2 DM, ESRD s/p renal transplant in 2012 on chronic immunosuppressants now with CKD 4, PAH, OSA on CPAP , gout, recently hospitalized from 3/29 to 06/28/2022 with pyelonephritis and Serratia bacteremia, completing IV ertapenem on 4/12, who presents to the ED for evaluation of a fall onto his right side while mowing, now complaining of right chest and rib pain.  He is not sure if he lost consciousness.  It hurts to take a deep breath.  Since his discharge on 4/5, patient has felt weak and deconditioned.  He developed a diarrhea and was seen by ID on 4/10.  He tested positive for Campylobacter and was prescribed azithromycin 500mg  on 4/11 for 5 days.  C. difficile was negative. ED course and data review: BP 167/97 with otherwise normal vitals.  Blood work at recent baseline for CKD and chronic anemia.  Blood glucose 285.EKG, personally viewed viewed and interpreted showing A-fib at 79 with no ischemic ST-T wave changes.  CT head and C-spine nonacute. CT chest without contrast showing nondisplaced right lateral third through fifth rib fractures without pneumothorax and suggestion of mild pulmonary contusion inferolateral right upper lobe. Patient was treated with morphine for pain.  He was placed on 2 L O2 for O2 sat in the low 90s.  Hospitalist consulted for admission.   Review of Systems: As mentioned in the history of present illness. All other systems reviewed and are negative.  Past Medical History:  Diagnosis Date    Anal condyloma    Anemia    Aortic atherosclerosis    Aortic stenosis    a.) TTE 10/2021: Mod AS. AVA 1.06cm^2 (VTI). Mean grad ; b.) TTE 04/02/2022: no AV stenosis   Asthma, persistent controlled 02/25/2013   Attention or concentration deficit 04/26/2021   BPH with obstruction/lower urinary tract symptoms 09/25/2014   CAD (coronary artery disease) 2005   a.) LHC 1996 -> HG stenosis mLAD -> PTCA/PCI with stent x 1 (unk type) -> complicated by CFA pseudoanurysm (required surg);  b.) LHC 08/10/2002  -> 95% LAD, 70% ISR LAD, 80% pOM, 70% mRCA --> CVTS consult; c.) 4v CABG 08/13/2002; c.) 03/2018 MV: Small, fixed inferoapical and apical sep defect. No isc. EF 47% (60-65% by 05/2018 TTE); d.) 02/2021 MV: small Apical inf fixed defect. No isc -> low risk.   Cardiomegaly    Cellulitis and abscess of left leg 07/22/2020   Chronic atrial fibrillation    a.) CHA2DS2VASc = 4 (CHF, HTN, vascular disease history, T2DM);  b.) rate/rhythm maintained without pharmacological intervention; chronically anticoagulated with apixaban   Chronic combined systolic and diastolic CHF (congestive heart failure)    a.) TTE 7/18 : EF 45-50%; b.) TTE 05/2018 : EF 60-65%, RVSP 74.8; c.) TTE 12/2018: EF 50-55%. Sev dil LA; d.) TTE 04/2019: EF 45-50%; e.) TTE 07/2021: EF 40-45%, G3DD; f.) TTE 10/2021: EF 40-45%, mild conc LVH, septal-lat dyssynchrony (LBBB), mildly red RVSF, mild-mod dil LA, mildly dil RA, mild-mod MS, mod AS; g.) TTE 04/02/2022: EF 40-45%, glob  HK, LVH, red RVSF, RVE, sev LAE, mild-mod RAE, mild MR   Chronic venous stasis dermatitis of both lower extremities    Cytomegaloviral disease 2017   Diverticulosis of colon 04/22/2013   Elevated RIGHT hemidiaphragm    Erectile dysfunction    a.) on topical TRT + Trimix (alprostadil/papaverine/phentolamine) injections   ESRD (end stage renal disease)    a.) dialysis dependent 2004-2012; b.) s/p cadaveric RIGHT renal transplant 09/13/2010   Essential hypertension  12/17/2010   GERD (gastroesophageal reflux disease)    Gout    History of 2019 novel coronavirus disease (COVID-19) 06/30/2019   History of bilateral cataract extraction 10/2017   History of renal transplant 09/13/2010   a.) s/p cadaveric donor transplant 09/13/2010   Hx of bilateral cataract extraction 10/2017   Hyperlipidemia    Hypogonadism in male 09/25/2014   Ischemic cardiomyopathy    Left cephalic vein thrombosis 06/2019   Long term current use of anticoagulant    a.) apixaban   Long term current use of immunosuppressive drug    a.) mycophenolate + prednisone + tacrolimus   Major depressive disorder 10/04/2013   Mitral stenosis 11/07/2021   a.) TTE 11/07/2021: severe MAC, mild-mod MS (MPG 6 mmHg); b.) TTE 04/02/2022: no MV stenosis   Murmur    Obesity hypoventilation syndrome 03/31/2012   OSA treated with BiPAP 09/29/2012   PAH (pulmonary artery hypertension)    a. 04/2019 RHC: RA 19, RV 80/20, PA 78/31 (51), PCWP 25, CO/CI 7.69/3.26. PVR 3.25 --> Sev PAH, likely primarily PV HTN; b.) RHC 04/04/2022: mRA 13, mPA 40, mPCWP 20, PA sat 59, AO sat 92, CO 7.63, CI 3.26, PVR 2.6 --> mod portal venous HTN   Pancreatitis    S/P CABG x 4 08/13/2002   a.) LIMA-LAD, LRA-OM, SVG-D1, SVG-dRCA   S/P gastric bypass    Synovitis of finger 06/11/2019   Trigger finger, unspecified little finger 12/23/2018   Type 2 diabetes mellitus with hyperglycemia, with long-term current use of insulin 09/09/2014   a.) has FreeStyle Libre CGM   Type 2 MI (myocardial infarction)    Ulcer 05/2016   Left shin   Wears glasses    Past Surgical History:  Procedure Laterality Date   CARDIAC CATHETERIZATION N/A 08/10/2002   CATARACT EXTRACTION W/PHACO Right 10/29/2017   Procedure: CATARACT EXTRACTION PHACO AND INTRAOCULAR LENS PLACEMENT (IOC)  RIGHT DIABETIC;  Surgeon: Lockie Mola, MD;  Location: [facility];  Service: Ophthalmology;  Laterality: Right   CATARACT EXTRACTION W/PHACO Left  11/18/2017   Procedure: CATARACT EXTRACTION PHACO AND INTRAOCULAR LENS PLACEMENT (IOC) LEFT IVA/TOPICAL;  Surgeon: Lockie Mola, MD;  Location: [facility];  Service: Ophthalmology;  Laterality: Left   COLONOSCOPY WITH PROPOFOL N/A 04/22/2013   Procedure: COLONOSCOPY WITH PROPOFOL;  Surgeon: Rachael Fee, MD;  Location: [facility];  Service: Endoscopy;  Laterality: N/A;   CORONARY ANGIOPLASTY WITH STENT PLACEMENT N/A 1996   CORONARY ARTERY BYPASS GRAFT N/A 08/13/2002   Procedure: 4v CORONARY ARTERY BYPASS GRAFTING; Location: [facility]; Surgeon: Katy Apo, MD   CYSTOSCOPY WITH INSERTION OF UROLIFT N/A 12/25/2021   Procedure: CYSTOSCOPY WITH INSERTION OF UROLIFT;  Surgeon: Riki Altes, MD;  Location: [facility];  Service: Urology;  Laterality: N/A;   DG ANGIO AV SHUNT*L*     right and left upper arms   FASCIOTOMY  03/03/2012   Procedure: FASCIOTOMY;  Surgeon: Nicki Reaper, MD;  Location: [facility];  Service: Orthopedics;  Laterality: Right;  FASCIOTOMY RIGHT SMALL  FINGER   FASCIOTOMY Left 08/17/2013   Procedure: FASCIOTOMY LEFT RING;  Surgeon: Nicki Reaper, MD;  Location: [facility];  Service: Orthopedics;  Laterality: Left;   INCISION AND DRAINAGE ABSCESS Left 10/15/2015   Procedure: INCISION AND DRAINAGE ABSCESS;  Surgeon: Leafy Ro, MD;  Location: [facility];  Service: General;  Laterality: Left;   KIDNEY TRANSPLANT  09/13/2010   cadaver--at [facility] HEART CATH N/A 11/15/2016   Procedure: RIGHT HEART CATH;  Surgeon: Iran Ouch, MD;  Location: [facility];  Service: Cardiovascular;  Laterality: N/A;   RIGHT HEART CATH N/A 05/03/2019   Procedure: RIGHT HEART CATH;  Surgeon: Laurey Morale, MD;  Location: [facility] IN[facility] CV LAB;  Service: Cardiovascular;  Laterality: N/A;   RIGHT HEART CATH N/A 04/04/2022   Procedure: RIGHT HEART CATH;  Surgeon: Laurey Morale, MD;  Location: [facility] INVASIVE CV LAB;  Service:  Cardiovascular;  Laterality: N/A;   ROUX-EN-Y GASTRIC BYPASS N/A 2015   TYMPANIC MEMBRANE REPAIR Left 03/2010   VASECTOMY     WART FULGURATION N/A 05/31/2022   Procedure: FULGURATION ANAL WART;  Surgeon: Sung Amabile, DO;  Location: [facility];  Service: General;  Laterality: N/A;   Social History:  reports that he quit smoking about 27 years ago. His smoking use included cigars. He has never been exposed to tobacco smoke. He has never used smokeless tobacco. He reports that he does not currently use alcohol. He reports that he does not use drugs.  Allergies  Allergen Reactions   Contrast Media [Iodinated Contrast Media] Other (See Comments)    Kidney transplant   Iodine Other (See Comments)    Other reaction(s): Other (See Comments) Kidney transplant   Ibuprofen Other (See Comments)    Due to kidney transplant Due to kidney transplant Due to kidney transplant    Family History  Problem Relation Age of Onset   Heart disease Father    Kidney failure Father    Kidney disease Father    Diabetes Maternal Grandmother    Breast cancer Maternal Grandmother    Valvular heart disease Mother    Liver cancer Paternal Uncle    Liver cancer Paternal Grandmother    Prostate cancer Neg Hx     Prior to Admission medications   Medication Sig Start Date End Date Taking? Authorizing Provider  albuterol (PROVENTIL) (2.5 MG/3ML) 0.083% nebulizer solution Take 3 mLs (2.5 mg total) by nebulization every 6 (six) hours as needed for wheezing or shortness of breath. 01/02/21   Eden Emms, NP  albuterol (VENTOLIN HFA) 108 (90 Base) MCG/ACT inhaler Inhale 2 puffs into the lungs every 6 (six) hours as needed for wheezing or shortness of breath. 05/30/22   Waymon Budge, MD  AMBULATORY NON FORMULARY MEDICATION Trimix (30/1/10)-(Pap/Phent/PGE)  Test Dose  1ml vial   Qty #3 Refills 0  [facility] [phone] Fax [phone] 04/30/22   Michiel Cowboy A, PA-C  apixaban (ELIQUIS) 5 MG TABS  tablet TAKE 1 TABLET BY MOUTH TWICE DAILY Patient taking differently: Take 5 mg by mouth 2 (two) times daily. 03/15/22 03/15/23  Iran Ouch, MD  azithromycin (ZITHROMAX) 500 MG tablet Take 1 tablet (500 mg total) by mouth daily. 07/04/22   Lynn Ito, MD  beclomethasone (QVAR) 80 MCG/ACT inhaler Inhale 2 puffs into the lungs daily as needed (for shortness of breath).    [provider]  buPROPion (WELLBUTRIN SR) 150 MG 12 hr tablet Take 1 tablet (  150 mg total) by mouth 2 (two) times daily. 06/17/22 06/17/23  [name], MD  cholecalciferol (VITAMIN D3) 25 MCG (1000 UNIT) tablet Take 1,000 Units by mouth at bedtime.    [provider]  colchicine 0.6 MG tablet Take 1 tablet (0.6 mg total) by mouth every other day. 06/17/22     Continuous Blood Gluc Sensor (FREESTYLE LIBRE 3 SENSOR) MISC Use one every 2 weeks. 06/13/22     cyanocobalamin 100 MCG tablet Take 100 mcg by mouth daily. 02/23/21   [provider]  dapagliflozin propanediol (FARXIGA) 10 MG TABS tablet Take 1 tablet (10 mg total) by mouth daily before breakfast. 12/04/21   Milford, Anderson Malta, FNP  febuxostat (ULORIC) 40 MG tablet Take 1 tablet (40 mg total) by mouth daily. 01/22/22     fluticasone (FLONASE) 50 MCG/ACT nasal spray PLACE 2 SPRAYS INTO BOTH NOSTRILS DAILY. 08/03/21 08/03/22  [name], MD  gabapentin (NEURONTIN) 300 MG capsule Take 1 capsule (300 mg total) by mouth 2 (two) times daily. 12/04/21     hydrALAZINE (APRESOLINE) 25 MG tablet Take 1 tablet (25 mg total) by mouth every 8 (eight) hours. 04/06/22     hydrocortisone 2.5 % cream Apply topically 3 (three) times daily as needed. 01/21/22   [name], MD  insulin glargine-yfgn (SEMGLEE) 100 UNIT/ML Pen Inject 35 Units into the skin daily. Patient taking differently: Inject 35 Units into the skin at bedtime. 01/22/22     insulin glargine-yfgn (SEMGLEE) 100 UNIT/ML Pen Inject 40 Units into the skin daily. 07/04/22      insulin lispro (HUMALOG KWIKPEN) 100 UNIT/ML KwikPen Inject 10-20 Units into the skin 3 (three) times daily. Per sliding scale 05/31/22   Tonna Boehringer, Isami, DO  Insulin Pen Needle (UNIFINE PENTIPS) 31G X 5 MM MISC Use 4 times daily as directed 05/28/21     iron polysaccharides (FERREX 150) 150 MG capsule Take 1 capsule (150 mg total) by mouth daily. 05/31/22   Tonna Boehringer, Isami, DO  isosorbide mononitrate (IMDUR) 30 MG 24 hr tablet Take 1 tablet (30 mg total) by mouth daily. 04/06/22   Marjie Skiff E, PA-C  lidocaine (LIDODERM) 5 % Place 1 patch onto the skin daily.PLACE 1 PATCH ONTO THE SKIN FOR 12 (TWELVE) HOURS. REMOVE & DISCARD PATCH WITHIN 12 HOURS OR AS DIRECTED BY MD 04/29/22 04/29/23  [name] I, MD  lidocaine (XYLOCAINE) 5 % ointment Apply 1 Application topically 3 (three) times daily as needed. Apply pea size amount to area as needed for pain. 05/31/22   Tonna Boehringer, Isami, DO  losartan (COZAAR) 50 MG tablet Take 50 mg by mouth daily. 03/01/22   [provider]  metolazone (ZAROXOLYN) 2.5 MG tablet Take 1 tablet by mouth 3 times weekly. 05/30/22   Laurey Morale, MD  montelukast (SINGULAIR) 10 MG tablet TAKE 1 TABLET BY MOUTH AT BEDTIME. Patient taking differently: Take 10 mg by mouth at bedtime. 12/04/21 12/04/22  Glenford Bayley, NP  Multiple Vitamin (MULTIVITAMIN WITH MINERALS) TABS tablet Take 1 tablet by mouth daily.    [provider]  mycophenolate (MYFORTIC) 180 MG EC tablet Take 2 tablets (360 mg total) by mouth 2 (two) times daily. 04/04/22     omeprazole (PRILOSEC) 20 MG capsule TAKE 1 CAPSULE BY MOUTH DAILY. Patient taking differently: Take 20 mg by mouth at bedtime. 01/07/22 01/07/23  [name], MD  potassium chloride (KLOR-CON) 10 MEQ tablet Take 4 tablets (40 mEq total) by mouth daily. 06/28/22  Gillis Santa, MD  predniSONE (DELTASONE) 5 MG tablet Take 1 tablet (5 mg total) by mouth daily. 03/01/22     rosuvastatin (CRESTOR) 10 MG tablet Take 1 tablet (10 mg total) by  mouth daily. Patient taking differently: Take 10 mg by mouth at bedtime. 07/25/21   Laurey Morale, MD  sulfamethoxazole-trimethoprim (BACTRIM) 400-80 MG tablet TAKE 1 TABLET BY MOUTH EVERY MONDAY, WEDNESDAY, FRIDAY. 09/21/21     tacrolimus (PROGRAF) 1 MG capsule Take 2 capsules (2 mg total) by mouth 2 times daily. 03/01/22     tamsulosin (FLOMAX) 0.4 MG CAPS capsule Take 1 capsule (0.4 mg total) by mouth daily. 07/04/22     Testosterone 10 MG/ACT (2%) GEL Apply 1 Pump topically daily to inner thighs - 2 pumps total. 05/30/22   McGowan, Carollee Herter A, PA-C  torsemide (DEMADEX) 20 MG tablet Take 5 tablets (100 mg total) by mouth daily. 06/28/22   Gillis Santa, MD  zinc sulfate 220 (50 Zn) MG capsule Take 220 mg by mouth daily.    [provider]    Physical Exam: Vitals:   07/06/22 2028 07/06/22 2031 07/07/22 0121  BP:  (!) 167/97 (!) 159/87  Pulse:  82 76  Resp:  17 18  Temp:  98 F (36.7 C)   TempSrc:  Oral   SpO2: 99% 98% 100%  Weight:  114.8 kg   Height:  5\' 10"  (1.778 m)    Physical Exam Vitals and nursing note reviewed.  Constitutional:      General: He is not in acute distress. HENT:     Head: Normocephalic and atraumatic.  Cardiovascular:     Rate and Rhythm: Normal rate and regular rhythm.     Heart sounds: Normal heart sounds.  Pulmonary:     Effort: Pulmonary effort is normal.     Breath sounds: Normal breath sounds.  Abdominal:     Palpations: Abdomen is soft.     Tenderness: There is no abdominal tenderness.  Neurological:     Mental Status: Mental status is at baseline.     Labs on Admission: I have personally reviewed following labs and imaging studies  CBC: Recent Labs  Lab 07/02/22 1024 07/04/22 1014 07/06/22 2035  WBC 6.4 6.4 7.4  NEUTROABS 5.4  --   --   HGB 10.8* 10.8* 11.5*  HCT 36.6* 36.6* 38.8*  MCV 79.7* 79.6* 79.8*  PLT 138* 143* 179   Basic Metabolic Panel: Recent Labs  Lab 07/02/22 1024 07/04/22 1014 07/06/22 2035  NA 141 140  138  K 3.0* 3.1* 4.3  CL 100 97* 94*  CO2 30 31 32  GLUCOSE 236* 113* 285*  BUN 83* 78* 73*  CREATININE 3.12* 2.82* 2.64*  CALCIUM 8.4* 8.3* 8.9   GFR: Estimated Creatinine Clearance: 36.8 mL/min (A) (by C-G formula based on SCr of 2.64 mg/dL (H)). Liver Function Tests: Recent Labs  Lab 07/02/22 1024 07/04/22 1014 07/06/22 2035  AST 22 33 49*  ALT 16 20 33  ALKPHOS 130* 136* 160*  BILITOT 0.7 0.6 0.7  PROT 6.8 6.9 7.7  ALBUMIN 3.1* 3.0* 3.5   No results for input(s): "LIPASE", "AMYLASE" in the last 168 hours. No results for input(s): "AMMONIA" in the last 168 hours. Coagulation Profile: No results for input(s): "INR", "PROTIME" in the last 168 hours. Cardiac Enzymes: No results for input(s): "CKTOTAL", "CKMB", "CKMBINDEX", "TROPONINI" in the last 168 hours. BNP (last 3 results) No results for input(s): "PROBNP" in the last 8760 hours. HbA1C: No results  for input(s): "HGBA1C" in the last 72 hours. CBG: Recent Labs  Lab 07/01/22 1023 07/02/22 0945 07/06/22 2037  GLUCAP 147* 179* 282*   Lipid Profile: No results for input(s): "CHOL", "HDL", "LDLCALC", "TRIG", "CHOLHDL", "LDLDIRECT" in the last 72 hours. Thyroid Function Tests: No results for input(s): "TSH", "T4TOTAL", "FREET4", "T3FREE", "THYROIDAB" in the last 72 hours. Anemia Panel: No results for input(s): "VITAMINB12", "FOLATE", "FERRITIN", "TIBC", "IRON", "RETICCTPCT" in the last 72 hours. Urine analysis:    Component Value Date/Time   COLORURINE RED (A) 06/21/2022 0848   APPEARANCEUR CLOUDY (A) 06/21/2022 0848   APPEARANCEUR Cloudy (A) 12/28/2021 1103   LABSPEC 1.014 06/21/2022 0848   PHURINE  06/21/2022 0848    TEST NOT REPORTED DUE TO COLOR INTERFERENCE OF URINE PIGMENT   GLUCOSEU (A) 06/21/2022 0848    TEST NOT REPORTED DUE TO COLOR INTERFERENCE OF URINE PIGMENT   HGBUR (A) 06/21/2022 0848    TEST NOT REPORTED DUE TO COLOR INTERFERENCE OF URINE PIGMENT   BILIRUBINUR (A) 06/21/2022 0848    TEST NOT  REPORTED DUE TO COLOR INTERFERENCE OF URINE PIGMENT   BILIRUBINUR Negative 12/14/2021 0902   KETONESUR (A) 06/21/2022 0848    TEST NOT REPORTED DUE TO COLOR INTERFERENCE OF URINE PIGMENT   PROTEINUR (A) 06/21/2022 0848    TEST NOT REPORTED DUE TO COLOR INTERFERENCE OF URINE PIGMENT   NITRITE (A) 06/21/2022 0848    TEST NOT REPORTED DUE TO COLOR INTERFERENCE OF URINE PIGMENT   LEUKOCYTESUR (A) 06/21/2022 0848    TEST NOT REPORTED DUE TO COLOR INTERFERENCE OF URINE PIGMENT    Radiological Exams on Admission: CT Cervical Spine Wo Contrast  Result Date: 07/07/2022 CLINICAL DATA:  Trauma/fall EXAM: CT CERVICAL SPINE WITHOUT CONTRAST TECHNIQUE: Multidetector CT imaging of the cervical spine was performed without intravenous contrast. Multiplanar CT image reconstructions were also generated. RADIATION DOSE REDUCTION: This exam was performed according to the departmental dose-optimization program which includes automated exposure control, adjustment of the mA and/or kV according to patient size and/or use of iterative reconstruction technique. COMPARISON:  None Available. FINDINGS: Alignment: Normal cervical lordosis. Skull base and vertebrae: No acute fracture. No primary bone lesion or focal pathologic process. Soft tissues and spinal canal: No prevertebral fluid or swelling. No visible canal hematoma. Disc levels: Right paracentral disc osteophyte complexes C3-4 (series 3/image 51) and diffuse posterior disc osteophyte complex at C4-5 (series 3/image 89), with resultant severe spinal canal stenosis, cord flattening, and severe bilateral neural foraminal narrowing. Upper chest: Visualized lung apices are clear. Other: Visualized thyroid is unremarkable. IMPRESSION: No traumatic injury to the cervical spine. Disc osteophyte complexes with severe spinal canal stenosis and bilateral neural foraminal narrowing at C3-4 and C4-5, as above. Consider follow-up MR cervical spine without contrast for further  evaluation, as clinically warranted. Electronically Signed   By: Charline Bills M.D.   On: 07/07/2022 01:11   CT Chest Wo Contrast  Result Date: 07/07/2022 CLINICAL DATA:  Fall, right chest pain EXAM: CT CHEST WITHOUT CONTRAST TECHNIQUE: Multidetector CT imaging of the chest was performed following the standard protocol without IV contrast. RADIATION DOSE REDUCTION: This exam was performed according to the departmental dose-optimization program which includes automated exposure control, adjustment of the mA and/or kV according to patient size and/or use of iterative reconstruction technique. COMPARISON:  06/21/2022 FINDINGS: Cardiovascular: Cardiomegaly.  No pericardial effusion. No evidence of thoracic aortic aneurysm. Atherosclerotic calcifications of the aortic arch. Severe three-vessel coronary atherosclerosis. Postsurgical changes related to prior CABG. Mediastinum/Nodes: Densely calcified 2.6  cm short axis right paratracheal node (series 1/image 62), chronic. Small calcified right perihilar nodes, chronic. No suspicious mediastinal lymphadenopathy. Visualized thyroid is unremarkable. Lungs/Pleura: Scattered calcified granulomata. No suspicious pulmonary nodules. Faint ground-glass peribronchovascular opacity in the inferolateral right upper lobe (series 3/image 86), suggesting mild pulmonary contusion in this clinical setting. No focal consolidation. No pleural effusion or pneumothorax. Upper Abdomen: Visualized upper abdomen is notable for a mildly nodular hepatic contour, mild splenomegaly, sleeve gastrectomy, and extensive vascular calcifications. Musculoskeletal: Nondisplaced right lateral 3rd through 5th rib fractures. Sternum, clavicles, and scapulae are intact. Degenerative changes of the visualized thoracolumbar spine. IMPRESSION: Nondisplaced right lateral 3rd through 5th rib fractures. No pneumothorax. Faint ground-glass opacity in the inferolateral right upper lobe, suggesting mild pulmonary  contusion in this clinical setting. Additional ancillary findings as above. Aortic Atherosclerosis (ICD10-I70.0). Electronically Signed   By: Charline Bills M.D.   On: 07/07/2022 01:01   CT HEAD WO CONTRAST  Result Date: 07/06/2022 CLINICAL DATA:  Head trauma, minor, normal mental status (Age 31-64y) on blood thinner EXAM: CT HEAD WITHOUT CONTRAST TECHNIQUE: Contiguous axial images were obtained from the base of the skull through the vertex without intravenous contrast. RADIATION DOSE REDUCTION: This exam was performed according to the departmental dose-optimization program which includes automated exposure control, adjustment of the mA and/or kV according to patient size and/or use of iterative reconstruction technique. COMPARISON:  CT head 06/21/2022 FINDINGS: Brain: Patchy and confluent areas of decreased attenuation are noted throughout the deep and periventricular white matter of the cerebral hemispheres bilaterally, compatible with chronic microvascular ischemic disease. No evidence of large-territorial acute infarction. No parenchymal hemorrhage. No mass lesion. No extra-axial collection. No mass effect or midline shift. No hydrocephalus. Basilar cisterns are patent. Vascular: No hyperdense vessel. Atherosclerotic calcifications are present within the cavernous internal carotid and vertebral arteries. Skull: No acute fracture or focal lesion. Sinuses/Orbits: Paranasal sinuses and mastoid air cells are clear. Bilateral lens replacement. Otherwise the orbits are unremarkable. Other: None. IMPRESSION: No acute intracranial abnormality. Electronically Signed   By: Tish Frederickson M.D.   On: 07/06/2022 21:09   DG Chest 1 View  Result Date: 07/06/2022 CLINICAL DATA:  [id number] Rib pain [id number] EXAM: CHEST  1 VIEW COMPARISON:  Chest x-ray 06/22/2022, CT chest 06/21/2022 FINDINGS: The heart and mediastinal contours are unchanged. Vascular clips overlie the mediastinum. Persistent prominent hilar vasculature.  No focal consolidation. Chronic coarsened interstitial markings. No pleural effusion. No pneumothorax. No acute osseous abnormality.  Sternotomy wires are intact. IMPRESSION: Low lung volumes with pulmonary venous congestion. Electronically Signed   By: Tish Frederickson M.D.   On: 07/06/2022 21:05     Data Reviewed: Relevant notes from primary care and specialist visits, past discharge summaries as available in EHR, including Care Everywhere. Prior diagnostic testing as pertinent to current admission diagnoses Updated medications and problem lists for reconciliation ED course, including vitals, labs, imaging, treatment and response to treatment Triage notes, nursing and pharmacy notes and ED provider's notes Notable results as noted in HPI   Assessment and Plan: Multiple fractures of ribs, right side, initial encounter for closed fracture Right pulmonary contusion, chronic anticoagulation Accidental fall Mild hypoxia - Pain control - Supplemental oxygen to keep sats over 94% - Will hold Eliquis tonight in view of pulmonary contusion - Incentive spirometer   Uncontrolled type 2 diabetes mellitus with hyperglycemia, with long-term current use of insulin Blood sugar 285 - Continue basal insulin - Sliding scale coverage  Campylobacter diarrhea Continue azithromycin 500 mg 4/11-- 4/16  Recent Serratia bacteremia secondary to pyelonephritis s/p IV ertapenem 3/29 - 07/05/2022 Patient recently completed IV ertapenem for treatment of Serratia bacteremia No acute concerns at this time regarding recent infection  CKD (chronic kidney disease) stage 4, GFR 15-29 ml/min History of renal transplant 2012 Chronic immunosuppressant Renal function at baseline - Continue mycophenolate prednisone and Prograf  Obstructive sleep apnea Pulmonary arterial hypertension Hypoxia to low 90s Continue supplemental O2 to keep sats over 94% CPAP nightly  CAD s/p CABG 2005 Continue Imdur, rosuvastatin.   Holding apixaban overnight pending stability of pulmonary contusion  Chronic combined systolic and diastolic CHF (congestive heart failure) Ischemic cardiomyopathy (EF 40 to 45% 03/2022) Clinically euvolemic Continue torsemide, metolazone, losartan, Imdur and hydralazine  Essential hypertension Slightly elevated, likely from pain Continue home antihypertensives  Chronic atrial fibrillation Not on beta-blockers Resume Eliquis once stable from pulmonary contusion  Obesity with body mass index of 30.0-39.9 Morbid obesity given comorbidities Complicating factor to overall prognosis and care    DVT prophylaxis: SCD  Consults: none  Advance Care Planning:   Code Status: Prior   Family Communication: none  Disposition Plan: Back to previous home environment  Severity of Illness: The appropriate patient status for this patient is INPATIENT. Inpatient status is judged to be reasonable and necessary in order to provide the required intensity of service to ensure the patient's safety. The patient's presenting symptoms, physical exam findings, and initial radiographic and laboratory data in the context of their chronic comorbidities is felt to place them at high risk for further clinical deterioration. Furthermore, it is not anticipated that the patient will be medically stable for discharge from the hospital within 2 midnights of admission.   * I certify that at the point of admission it is my clinical judgment that the patient will require inpatient hospital care spanning beyond 2 midnights from the point of admission due to high intensity of service, high risk for further deterioration and high frequency of surveillance required.*  Author: Andris Baumann, MD 07/07/2022 2:38 AM  For on call review [email or web address].

## 2022-07-07 NOTE — ED Notes (Signed)
Oxygen turned off at this time.

## 2022-07-07 NOTE — Assessment & Plan Note (Signed)
Continue Imdur, rosuvastatin.  Holding apixaban overnight pending stability of pulmonary contusion

## 2022-07-07 NOTE — Assessment & Plan Note (Addendum)
Ischemic cardiomyopathy (EF 40 to 45% 03/2022) Clinically euvolemic Continue torsemide, metolazone, losartan, Imdur and hydralazine

## 2022-07-07 NOTE — Consult Note (Signed)
Patient ID: Carl Waymire., male   DOB: [birth year], 62 y.o.   MRN: [id number]  HPI Carl Jaekwon Mcclune. is a 62 y.o. male seen in consultation at the request of Dr. Lucianne Muss.  He fell last evening around 6 PM and apparently said that he tripped over the shoot of his lawnmower.  He is clear that he did not lose consciousness.  He endorses severe pain in the right chest wall that is worse after taking a deep breath. Did have an appropriate workup including a CT of the C-spine and chest abdomen pelvis that have personally reviewed showing evidence of right-sided rib fractures with a small contusion there is no evidence of pneumothorax or hemothorax.  No other vascular or soft tissue injuries. He has extensive chronic medical issues including chronic kidney disease previously on dialysis, HTN, DM, on Eliquis for A-fib s/p transplant 2012 on chronic immunosupressants.   BASIC METABOLIC PANEL - Abnormal; Notable for the following components:      Result Value     Chloride 94 (*)      Glucose, Bld 285 (*)      BUN 73 (*)      Creatinine, Ser 2.64 (*)      GFR, Estimated 27 (*)      All other components within normal limits  CBC - Abnormal; Notable for the following components:    Hemoglobin 11.5 (*)      HCT 38.8 (*)      MCV 79.8 (*)      MCH 23.7 (*)      MCHC 29.6 (*)      RDW 19.8 (*)      All other components within normal limits  HEPATIC FUNCTION PANEL - Abnormal; Notable for the following components:    AST 49 (*)      Alkaline Phosphatase 160 (*)      All other components within normal limits  CBC - Abnormal; Notable for the following components:    Hemoglobin 10.5 (*)      HCT 36.1 (*)      MCH 23.7 (*)      MCHC 29.1 (*)      RDW 19.7 (*)      All other components within normal limits  BASIC METABOLIC PANEL - Abnormal; Notable for the following components:    Chloride 97 (*)      CO2 35 (*)      Glucose, Bld 149 (*)      BUN 69 (*)      Creatinine, Ser 2.54 (*)       Calcium 8.8 (*)      GFR, Estimated 28 (*)      HPI  Past Medical History:  Diagnosis Date   Anal condyloma    Anemia    Aortic atherosclerosis    Aortic stenosis    a.) TTE 10/2021: Mod AS. AVA 1.06cm^2 (VTI). Mean grad ; b.) TTE 04/02/2022: no AV stenosis   Asthma, persistent controlled 02/25/2013   Attention or concentration deficit 04/26/2021   BPH with obstruction/lower urinary tract symptoms 09/25/2014   CAD (coronary artery disease) 2005   a.) LHC 1996 -> HG stenosis mLAD -> PTCA/PCI with stent x 1 (unk type) -> complicated by CFA pseudoanurysm (required surg);  b.) LHC 08/10/2002  -> 95% LAD, 70% ISR LAD, 80% pOM, 70% mRCA --> CVTS consult; c.) 4v CABG 08/13/2002; c.) 03/2018 MV: Small, fixed inferoapical and apical sep defect. No isc. EF 47% (60-65% by 05/2018  TTE); d.) 02/2021 MV: small Apical inf fixed defect. No isc -> low risk.   Cardiomegaly    Cellulitis and abscess of left leg 07/22/2020   Chronic atrial fibrillation    a.) CHA2DS2VASc = 4 (CHF, HTN, vascular disease history, T2DM);  b.) rate/rhythm maintained without pharmacological intervention; chronically anticoagulated with apixaban   Chronic combined systolic and diastolic CHF (congestive heart failure)    a.) TTE 7/18 : EF 45-50%; b.) TTE 05/2018 : EF 60-65%, RVSP 74.8; c.) TTE 12/2018: EF 50-55%. Sev dil LA; d.) TTE 04/2019: EF 45-50%; e.) TTE 07/2021: EF 40-45%, G3DD; f.) TTE 10/2021: EF 40-45%, mild conc LVH, septal-lat dyssynchrony (LBBB), mildly red RVSF, mild-mod dil LA, mildly dil RA, mild-mod MS, mod AS; g.) TTE 04/02/2022: EF 40-45%, glob HK, LVH, red RVSF, RVE, sev LAE, mild-mod RAE, mild MR   Chronic venous stasis dermatitis of both lower extremities    Cytomegaloviral disease 2017   Diverticulosis of colon 04/22/2013   Elevated RIGHT hemidiaphragm    Erectile dysfunction    a.) on topical TRT + Trimix (alprostadil/papaverine/phentolamine) injections   ESRD (end stage renal disease)    a.) dialysis  dependent 2004-2012; b.) s/p cadaveric RIGHT renal transplant 09/13/2010   Essential hypertension 12/17/2010   GERD (gastroesophageal reflux disease)    Gout    History of 2019 novel coronavirus disease (COVID-19) 06/30/2019   History of bilateral cataract extraction 10/2017   History of renal transplant 09/13/2010   a.) s/p cadaveric donor transplant 09/13/2010   Hx of bilateral cataract extraction 10/2017   Hyperlipidemia    Hypogonadism in male 09/25/2014   Ischemic cardiomyopathy    Left cephalic vein thrombosis 06/2019   Long term current use of anticoagulant    a.) apixaban   Long term current use of immunosuppressive drug    a.) mycophenolate + prednisone + tacrolimus   Major depressive disorder 10/04/2013   Mitral stenosis 11/07/2021   a.) TTE 11/07/2021: severe MAC, mild-mod MS (MPG 6 mmHg); b.) TTE 04/02/2022: no MV stenosis   Murmur    Obesity hypoventilation syndrome 03/31/2012   OSA treated with BiPAP 09/29/2012   PAH (pulmonary artery hypertension)    a. 04/2019 RHC: RA 19, RV 80/20, PA 78/31 (51), PCWP 25, CO/CI 7.69/3.26. PVR 3.25 --> Sev PAH, likely primarily PV HTN; b.) RHC 04/04/2022: mRA 13, mPA 40, mPCWP 20, PA sat 59, AO sat 92, CO 7.63, CI 3.26, PVR 2.6 --> mod portal venous HTN   Pancreatitis    S/P CABG x 4 08/13/2002   a.) LIMA-LAD, LRA-OM, SVG-D1, SVG-dRCA   S/P gastric bypass    Synovitis of finger 06/11/2019   Trigger finger, unspecified little finger 12/23/2018   Type 2 diabetes mellitus with hyperglycemia, with long-term current use of insulin 09/09/2014   a.) has FreeStyle Libre CGM   Type 2 MI (myocardial infarction)    Ulcer 05/2016   Left shin   Wears glasses     Past Surgical History:  Procedure Laterality Date   CARDIAC CATHETERIZATION N/A 08/10/2002   CATARACT EXTRACTION W/PHACO Right 10/29/2017   Procedure: CATARACT EXTRACTION PHACO AND INTRAOCULAR LENS PLACEMENT (IOC)  RIGHT DIABETIC;  Surgeon: Lockie Mola, MD;  Location:  [facility];  Service: Ophthalmology;  Laterality: Right   CATARACT EXTRACTION W/PHACO Left 11/18/2017   Procedure: CATARACT EXTRACTION PHACO AND INTRAOCULAR LENS PLACEMENT (IOC) LEFT IVA/TOPICAL;  Surgeon: Lockie Mola, MD;  Location: [facility];  Service: Ophthalmology;  Laterality: Left   COLONOSCOPY WITH PROPOFOL N/A  04/22/2013   Procedure: COLONOSCOPY WITH PROPOFOL;  Surgeon: Rachael Fee, MD;  Location: [facility];  Service: Endoscopy;  Laterality: N/A;   CORONARY ANGIOPLASTY WITH STENT PLACEMENT N/A 1996   CORONARY ARTERY BYPASS GRAFT N/A 08/13/2002   Procedure: 4v CORONARY ARTERY BYPASS GRAFTING; Location: [facility]; Surgeon: Katy Apo, MD   CYSTOSCOPY WITH INSERTION OF UROLIFT N/A 12/25/2021   Procedure: CYSTOSCOPY WITH INSERTION OF UROLIFT;  Surgeon: Riki Altes, MD;  Location: [facility];  Service: Urology;  Laterality: N/A;   DG ANGIO AV SHUNT*L*     right and left upper arms   FASCIOTOMY  03/03/2012   Procedure: FASCIOTOMY;  Surgeon: Nicki Reaper, MD;  Location: [facility];  Service: Orthopedics;  Laterality: Right;  FASCIOTOMY RIGHT SMALL FINGER   FASCIOTOMY Left 08/17/2013   Procedure: FASCIOTOMY LEFT RING;  Surgeon: Nicki Reaper, MD;  Location: [facility];  Service: Orthopedics;  Laterality: Left;   INCISION AND DRAINAGE ABSCESS Left 10/15/2015   Procedure: INCISION AND DRAINAGE ABSCESS;  Surgeon: Leafy Ro, MD;  Location: [facility];  Service: General;  Laterality: Left;   KIDNEY TRANSPLANT  09/13/2010   cadaver--at [facility] HEART CATH N/A 11/15/2016   Procedure: RIGHT HEART CATH;  Surgeon: Iran Ouch, MD;  Location: [facility];  Service: Cardiovascular;  Laterality: N/A;   RIGHT HEART CATH N/A 05/03/2019   Procedure: RIGHT HEART CATH;  Surgeon: Laurey Morale, MD;  Location: [facility] CV [facility];  Service: Cardiovascular;  Laterality: N/A;   RIGHT HEART CATH N/A 04/04/2022    Procedure: RIGHT HEART CATH;  Surgeon: Laurey Morale, MD;  Location: [facility] CV [facility];  Service: Cardiovascular;  Laterality: N/A;   ROUX-EN-Y GASTRIC BYPASS N/A 2015   TYMPANIC MEMBRANE REPAIR Left 03/2010   VASECTOMY     WART FULGURATION N/A 05/31/2022   Procedure: FULGURATION ANAL WART;  Surgeon: Sung Amabile, DO;  Location: [facility];  Service: General;  Laterality: N/A;    Family History  Problem Relation Age of Onset   Heart disease Father    Kidney failure Father    Kidney disease Father    Diabetes Maternal Grandmother    Breast cancer Maternal Grandmother    Valvular heart disease Mother    Liver cancer Paternal Uncle    Liver cancer Paternal Grandmother    Prostate cancer Neg Hx     Social History Social History   Tobacco Use   Smoking status: Former    Types: Cigars    Quit date: 09/02/1994    Years since quitting: 27.8    Passive exposure: Never   Smokeless tobacco: Never   Tobacco comments:    Occasional, rare cigars  Vaping Use   Vaping Use: Never used  Substance Use Topics   Alcohol use: Not Currently    Comment: occ   Drug use: No    Allergies  Allergen Reactions   Contrast Media [Iodinated Contrast Media] Other (See Comments)    Kidney transplant   Iodine Other (See Comments)    Other reaction(s): Other (See Comments) Kidney transplant   Ibuprofen Other (See Comments)    Due to kidney transplant Due to kidney transplant Due to kidney transplant    Current Facility-Administered Medications  Medication Dose Route Frequency Provider Last Rate Last Admin   acetaminophen (TYLENOL) tablet 650 mg  650 mg Oral Q6H PRN Andris Baumann, MD       Or  acetaminophen (TYLENOL) suppository 650 mg  650 mg Rectal Q6H PRN Andris Baumann, MD       albuterol (PROVENTIL) (2.5 MG/3ML) 0.083% nebulizer solution 2.5 mg  2.5 mg Inhalation Q6H PRN Andris Baumann, MD       buPROPion [facility] SR) 12 hr tablet 150 mg  150 mg Oral BID Lindajo Royal V, MD   150 mg  at 07/07/22 0959   [START ON 07/08/2022] colchicine tablet 0.6 mg  0.6 mg Oral Bedelia Person, MD       gabapentin (NEURONTIN) capsule 300 mg  300 mg Oral BID Lindajo Royal V, MD   300 mg at 07/07/22 0959   hydrALAZINE (APRESOLINE) tablet 25 mg  25 mg Oral Q8H Andris Baumann, MD   25 mg at 07/07/22 0655   HYDROcodone-acetaminophen (NORCO/VICODIN) 5-325 MG per tablet 1-2 tablet  1-2 tablet Oral Q4H PRN Andris Baumann, MD       HYDROmorphone (DILAUDID) injection 0.5-1 mg  0.5-1 mg Intravenous Q2H PRN Andris Baumann, MD   1 mg at 07/07/22 0650   insulin aspart (novoLOG) injection 0-20 Units  0-20 Units Subcutaneous TID WC Andris Baumann, MD   4 Units at 07/07/22 0806   insulin aspart (novoLOG) injection 0-5 Units  0-5 Units Subcutaneous QHS Andris Baumann, MD       insulin glargine-yfgn [facility]) injection 35 Units  35 Units Subcutaneous Daily Andris Baumann, MD       isosorbide mononitrate (IMDUR) 24 hr tablet 30 mg  30 mg Oral Daily Lindajo Royal V, MD   30 mg at 07/07/22 0959   losartan (COZAAR) tablet 50 mg  50 mg Oral Daily Andris Baumann, MD   50 mg at 07/07/22 0959   [START ON 07/08/2022] metolazone (ZAROXOLYN) tablet 2.5 mg  2.5 mg Oral Once per day on Mon Wed Fri [name], Odetta Pink, MD       mycophenolate (MYFORTIC) EC tablet 360 mg  360 mg Oral BID Andris Baumann, MD   360 mg at 07/07/22 0959   ondansetron (ZOFRAN) tablet 4 mg  4 mg Oral Q6H PRN Andris Baumann, MD       Or   ondansetron [facility]) injection 4 mg  4 mg Intravenous Q6H PRN Andris Baumann, MD       pantoprazole (PROTONIX) EC tablet 40 mg  40 mg Oral Daily Lindajo Royal V, MD   40 mg at 07/07/22 0959   potassium chloride SA (KLOR-CON M) CR tablet 40 mEq  40 mEq Oral Daily Andris Baumann, MD   40 mEq at 07/07/22 0959   predniSONE (DELTASONE) tablet 5 mg  5 mg Oral Daily Lindajo Royal V, MD   5 mg at 07/07/22 1610   rosuvastatin (CRESTOR) tablet 10 mg  10 mg Oral QHS Andris Baumann, MD       tacrolimus (PROGRAF)  capsule 2 mg  2 mg Oral BID Lindajo Royal V, MD   2 mg at 07/07/22 1000   tamsulosin (FLOMAX) capsule 0.4 mg  0.4 mg Oral Daily Andris Baumann, MD   0.4 mg at 07/07/22 9604   Current Outpatient Medications  Medication Sig Dispense Refill   albuterol (PROVENTIL) (2.5 MG/3ML) 0.083% nebulizer solution Take 3 mLs (2.5 mg total) by nebulization every 6 (six) hours as needed for wheezing or shortness of breath. 150 mL 0   albuterol (VENTOLIN HFA) 108 (90 Base) MCG/ACT inhaler Inhale 2 puffs into the lungs  every 6 (six) hours as needed for wheezing or shortness of breath. 6.7 g 5   AMBULATORY NON FORMULARY MEDICATION Trimix (30/1/10)-(Pap/Phent/PGE)  Test Dose  78ml vial   Qty #3 Refills 0  [facility] [phone] Fax [phone] 3 mL 0   apixaban (ELIQUIS) 5 MG TABS tablet TAKE 1 TABLET BY MOUTH TWICE DAILY (Patient taking differently: Take 5 mg by mouth 2 (two) times daily.) 180 tablet 1   azithromycin (ZITHROMAX) 500 MG tablet Take 1 tablet (500 mg total) by mouth daily. 5 tablet 0   buPROPion (WELLBUTRIN SR) 150 MG 12 hr tablet Take 1 tablet (150 mg total) by mouth 2 (two) times daily. 180 tablet 3   cholecalciferol (VITAMIN D3) 25 MCG (1000 UNIT) tablet Take 1,000 Units by mouth at bedtime.     cyanocobalamin 100 MCG tablet Take 100 mcg by mouth daily.     dapagliflozin propanediol (FARXIGA) 10 MG TABS tablet Take 1 tablet (10 mg total) by mouth daily before breakfast. 90 tablet 3   febuxostat (ULORIC) 40 MG tablet Take 1 tablet (40 mg total) by mouth daily. 90 tablet 5   fluticasone (FLONASE) 50 MCG/ACT nasal spray PLACE 2 SPRAYS INTO BOTH NOSTRILS DAILY. 16 g 11   gabapentin (NEURONTIN) 300 MG capsule Take 1 capsule (300 mg total) by mouth 2 (two) times daily. 180 capsule 3   hydrALAZINE (APRESOLINE) 25 MG tablet Take 1 tablet (25 mg total) by mouth every 8 (eight) hours. 90 tablet 1   HYDROcodone-acetaminophen (NORCO/VICODIN) 5-325 MG tablet Take 1 tablet by mouth every 6 (six)  hours as needed.     hydrocortisone 2.5 % cream Apply topically 3 (three) times daily as needed. 30 g 3   insulin glargine-yfgn (SEMGLEE) 100 UNIT/ML Pen Inject 35 Units into the skin daily. (Patient taking differently: Inject 35 Units into the skin at bedtime.) 45 mL 3   insulin glargine-yfgn (SEMGLEE) 100 UNIT/ML Pen Inject 40 Units into the skin daily. 45 mL 3   insulin lispro (HUMALOG KWIKPEN) 100 UNIT/ML KwikPen Inject 10-20 Units into the skin 3 (three) times daily. Per sliding scale     iron polysaccharides (FERREX 150) 150 MG capsule Take 1 capsule (150 mg total) by mouth daily.     isosorbide mononitrate (IMDUR) 30 MG 24 hr tablet Take 1 tablet (30 mg total) by mouth daily. 30 tablet 2   lidocaine (LIDODERM) 5 % Place 1 patch onto the skin daily.PLACE 1 PATCH ONTO THE SKIN FOR 12 (TWELVE) HOURS. REMOVE & DISCARD PATCH WITHIN 12 HOURS OR AS DIRECTED BY MD (Patient taking differently: Place 1 patch onto the skin daily as needed.) 60 patch 11   lidocaine (XYLOCAINE) 5 % ointment Apply 1 Application topically 3 (three) times daily as needed. Apply pea size amount to area as needed for pain. 50 g 0   losartan (COZAAR) 50 MG tablet Take 50 mg by mouth daily.     metolazone (ZAROXOLYN) 2.5 MG tablet Take 1 tablet by mouth 3 times weekly. 15 tablet 3   montelukast (SINGULAIR) 10 MG tablet TAKE 1 TABLET BY MOUTH AT BEDTIME. (Patient taking differently: Take 10 mg by mouth at bedtime.) 90 tablet 3   Multiple Vitamin (MULTIVITAMIN WITH MINERALS) TABS tablet Take 1 tablet by mouth daily.     mycophenolate (MYFORTIC) 180 MG EC tablet Take 2 tablets (360 mg total) by mouth 2 (two) times daily. 360 tablet 3   omeprazole (PRILOSEC) 20 MG capsule TAKE 1 CAPSULE BY MOUTH DAILY. (Patient  taking differently: Take 20 mg by mouth at bedtime.) 90 capsule 3   potassium chloride (KLOR-CON) 10 MEQ tablet Take 4 tablets (40 mEq total) by mouth daily. 120 tablet 8   predniSONE (DELTASONE) 5 MG tablet Take 1 tablet (5  mg total) by mouth daily. 90 tablet 3   rosuvastatin (CRESTOR) 10 MG tablet Take 1 tablet (10 mg total) by mouth daily. (Patient taking differently: Take 10 mg by mouth at bedtime.) 90 tablet 3   sulfamethoxazole-trimethoprim (BACTRIM) 400-80 MG tablet TAKE 1 TABLET BY MOUTH EVERY MONDAY, WEDNESDAY, FRIDAY. 36 tablet 3   tacrolimus (PROGRAF) 1 MG capsule Take 2 capsules (2 mg total) by mouth 2 times daily. 120 capsule 11   tamsulosin (FLOMAX) 0.4 MG CAPS capsule Take 1 capsule (0.4 mg total) by mouth daily. 90 capsule 3   Testosterone 10 MG/ACT (2%) GEL Apply 1 Pump topically daily to inner thighs - 2 pumps total. 60 g 0   torsemide (DEMADEX) 20 MG tablet Take 5 tablets (100 mg total) by mouth daily. 300 tablet 3   zinc sulfate 220 (50 Zn) MG capsule Take 220 mg by mouth daily.     colchicine 0.6 MG tablet Take 1 tablet (0.6 mg total) by mouth every other day. (Patient not taking: Reported on 07/07/2022) 45 tablet 1   Continuous Blood Gluc Sensor (FREESTYLE LIBRE 3 SENSOR) MISC Use one every 2 weeks. 6 each 3   Insulin Pen Needle (UNIFINE PENTIPS) 31G X 5 MM MISC Use 4 times daily as directed 400 each 3     Review of Systems Full ROS  was asked and was negative except for the information on the HPI  Physical Exam Blood pressure 118/84, pulse 72, temperature 98.5 F (36.9 C), temperature source Oral, resp. rate 12, height  (1.778 m), weight 114.8 kg, SpO2 95 %. CONSTITUTIONAL: NAD but wearing O2. EYES: Pupils are equal, round,  Sclera are non-icteric. EARS, NOSE, MOUTH AND THROAT:. The oral mucosa is pink and moist. Hearing is intact to voice. LYMPH NODES:  Lymph nodes in the neck are normal. Neck: No evidence of midline cervical or thoracic spinal tenderness.  No evidence of limitation of range of motion and no tenderness with movement RESPIRATORY:  Lungs are clear. There is low respiratory effort due to pain, with equal breath sounds bilaterally, and without pathologic use of accessory  muscles. Evidence of chest wall tenderness on anterior and lateral chest wall. CARDIOVASCULAR: Heart is regular without murmurs, gallops, or rubs. GI: The abdomen is  soft, nontender, and nondistended.  There is a soft tissue ecchymosis in the epigastric area.  There is no evidence of expanding hematomas.  There are no palpable masses. There is no hepatosplenomegaly. There are normal bowel sounds GU: Rectal deferred.   MUSCULOSKELETAL: Normal muscle strength and tone.  Chronic lower extremity changes of venous stasis and diabetes. SKIN: Turgor is good and there are no pathologic skin lesions or ulcers. NEUROLOGIC: Motor and sensation is grossly normal. Cranial nerves are grossly intact. PSYCH:  Oriented to person, place and time. Affect is normal.  Data Reviewed  I have personally reviewed the patient's imaging, laboratory findings and medical records.    Assessment/Plan 62 year old male status post fall and right 3 through fifth rib fractures and the mild pulmonary contusion in the right side.  There is no evidence of hemo or pneumothorax and there is no evidence of any other major intrathoracic or intra-abdominal injuries. No need for surgical intervention or chest tube.  Recommend aggressive pulmonary toilet with aggressive pain management.  He does have multiple medical issues that complicate things more he is anticoagulated his diabetic has significant coronary artery disease.  We will obtain a chest x-ray in the morning I will continue to follow him up.  Sterling Big, MD FACS General Surgeon 07/07/2022, 11:34 AM

## 2022-07-07 NOTE — ED Notes (Signed)
Report received from Brian, RN

## 2022-07-07 NOTE — Plan of Care (Signed)
62 y.o. male with medical history significant for CAD s/p CABG in 2005, ischemic cardiomyopathy (EF 40 to 45% 03/2022), chronic A. Fib on Eliquis, aortic stenosis, HTN, type 2 DM, ESRD s/p renal transplant in 2012 on chronic immunosuppressants now with CKD 4, PAH, OSA on CPAP , gout, recently hospitalized from 3/29 to 06/28/2022 with pyelonephritis and Serratia bacteremia, completing IV ertapenem on 4/12, who presents to the ED for evaluation of a fall was found to have multiple rib fracture on the right side.  Surgery consulted for input on anticoagulation.  Will need repeat CXR in the morning.  In the meantime continue to hold Eliquis  History and physical reviewed will continue with assessment plan as described in H&P.

## 2022-07-07 NOTE — Assessment & Plan Note (Addendum)
Pulmonary arterial hypertension Hypoxia to low 90s Continue supplemental O2 to keep sats over 94% CPAP nightly

## 2022-07-07 NOTE — Assessment & Plan Note (Signed)
Blood sugar 285 - Continue basal insulin - Sliding scale coverage

## 2022-07-07 NOTE — ED Provider Notes (Signed)
[facility] Provider Note    Event Date/Time   First MD Initiated Contact with Patient 07/06/22 2352     (approximate)   History   Weakness   HPI [name]. is a 62 y.o. male with extensive chronic medical issues including chronic kidney disease previously on dialysis, on Eliquis for A-fib.  He presents for evaluation of right-sided chest and rib pain.  He has had a couple of falls recently, including most recently about 6 PM today.  He said that he fell right after getting off the lawnmower and struck the right side of his chest.  He did not fully pass out.  He may have struck his head.  He has had gradually worsening pain over the course of the evening.  He has some degree of chronic pain as a result of all his medical issues but given that he is on Eliquis he is also concerned that he may have some internal injuries, as well as possibility of cracking a rib.  He is having difficulty breathing due to the pain, otherwise no acute shortness of breath.  No other recent changes or injuries.     Physical Exam   Triage Vital Signs: ED Triage Vitals  Enc Vitals Group     BP 07/06/22 2031 (!) 167/97     Pulse Rate 07/06/22 2031 82     Resp 07/06/22 2031 17     Temp 07/06/22 2031 98 F (36.7 C)     Temp Source 07/06/22 2031 Oral     SpO2 07/06/22 2028 99 %     Weight 07/06/22 2031 114.8 kg (253 lb)     Height 07/06/22 2031 1.778 m (5\' 10" )     Head Circumference --      Peak Flow --      Pain Score 07/06/22 2031 9     Pain Loc --      Pain Edu? --      Excl. in GC? --     Most recent vital signs: Vitals:   07/07/22 0515 07/07/22 0630  BP:  (!) 143/85  Pulse:  78  Resp:  16  Temp: 97.6 F (36.4 C) 98.5 F (36.9 C)  SpO2:  100%    General: Awake, appears to be in pain but is not in severe respiratory distress. CV:  Good peripheral perfusion.  Regular rate and rhythm. Resp:  Normal effort. Speaking easily and comfortably, no accessory  muscle usage nor intercostal retractions.  Lungs are clear to auscultation. Abd:  No distention.  No tenderness to palpation of the abdomen. Other:  Patient has scattered bruising that appears subacute on his chest and abdomen, likely the result of being on Eliquis, but no obvious hematoma or contusion in the area of tenderness.  He has highly reproducible right-sided chest wall tenderness to palpation with no palpable deformities.   ED Results / Procedures / Treatments   Labs (all labs ordered are listed, but only abnormal results are displayed) Labs Reviewed  BASIC METABOLIC PANEL - Abnormal; Notable for the following components:      Result Value   Chloride 94 (*)    Glucose, Bld 285 (*)    BUN 73 (*)    Creatinine, Ser 2.64 (*)    GFR, Estimated 27 (*)    All other components within normal limits  CBC - Abnormal; Notable for the following components:   Hemoglobin 11.5 (*)    HCT 38.8 (*)  MCV 79.8 (*)    MCH 23.7 (*)    MCHC 29.6 (*)    RDW 19.8 (*)    All other components within normal limits  HEPATIC FUNCTION PANEL - Abnormal; Notable for the following components:   AST 49 (*)    Alkaline Phosphatase 160 (*)    All other components within normal limits  CBC - Abnormal; Notable for the following components:   Hemoglobin 10.5 (*)    HCT 36.1 (*)    MCH 23.7 (*)    MCHC 29.1 (*)    RDW 19.7 (*)    All other components within normal limits  BASIC METABOLIC PANEL - Abnormal; Notable for the following components:   Chloride 97 (*)    CO2 35 (*)    Glucose, Bld 149 (*)    BUN 69 (*)    Creatinine, Ser 2.54 (*)    Calcium 8.8 (*)    GFR, Estimated 28 (*)    All other components within normal limits  CBG MONITORING, ED - Abnormal; Notable for the following components:   Glucose-Capillary 282 (*)    All other components within normal limits  CBG MONITORING, ED - Abnormal; Notable for the following components:   Glucose-Capillary 155 (*)    All other components within  normal limits  CBG MONITORING, ED - Abnormal; Notable for the following components:   Glucose-Capillary 152 (*)    All other components within normal limits     EKG  ED ECG REPORT I, Loleta Rose, the attending physician, personally viewed and interpreted this ECG.  Date: 07/06/2022 EKG Time: 20: 27 Rate: 79 Rhythm: Atrial fibrillation QRS Axis: normal Intervals:  ST/T Wave abnormalities: Non-specific ST segment / T-wave changes, but no clear evidence of acute ischemia. Narrative Interpretation: no definitive evidence of acute ischemia; does not meet STEMI criteria.    RADIOLOGY I viewed and interpreted the patient's chest x-ray as well as his CT head, chest, and C-spine.  See hospital course for details.  Patient has multiple rib fractures and probable pulmonary contusion.   PROCEDURES:  Critical Care performed: No  .1-3 Lead EKG Interpretation  Performed by: Loleta Rose, MD Authorized by: Loleta Rose, MD     Interpretation: normal     ECG rate:  75   ECG rate assessment: normal     Rhythm: sinus rhythm     Ectopy: none     Conduction: normal       IMPRESSION / MDM / ASSESSMENT AND PLAN / ED COURSE  I reviewed the triage vital signs and the nursing notes.                              Differential diagnosis includes, but is not limited to, rib fractures, pulmonary contusion, cardiac contusion, intrathoracic bleed.  Patient's presentation is most consistent with acute presentation with potential threat to life or bodily function.  Labs/studies ordered: Chest x-ray, head CT, chest CT, cervical spine CT, BMP, hepatic function panel, CBC, CBG, EKG  Interventions/Medications given:  Morphine 4 mg IV, Zofran 4 mg IV,   (Note:  hospital course my include additional interventions and/or labs/studies not listed above.)   Patient's lab work is essentially at baseline.  However, I am concerned about the possibility of intrathoracic bleed or more likely, rib  fractures in the setting of his recent fall.  He is requiring some oxygen support but this may be due to either pulmonary contusion  or due to the fact that he was splinting from pain.  I ordered morphine and Zofran to help with pain control to see if this helps with his overall status.  He is not a good candidate for CT scan with IV contrast given his chronic kidney disease but I will obtain a CT chest without contrast to look for any evidence of hematoma even if we cannot see active extravasation, and we can also look for rib fractures.  The patient is on the cardiac monitor to evaluate for evidence of arrhythmia and/or significant heart rate changes.   Clinical Course as of 07/07/22 0816  Sun Jul 07, 2022  0149 CT Cervical Spine Wo Contrast I viewed and interpreted the patient's CT cervical spine.  I do not see any evidence of acute fracture.  However the radiologist mentioned that there is quite a bit of canal stenosis and osteophytes and recommended a MRI of the cervical spine for follow-up "as clinically warranted".  However the patient is having no signs or symptoms of injury or paralysis at this time, so I will hold off on any imaging and focus on the acute issue [CF]  0149 CT Chest Wo Contrast I viewed and interpreted the patient's CT chest.  He has a few rib fractures on the right that correlates with his area of pain.  The radiologist confirmed that there are 3 nondisplaced rib fractures.  However, the patient additionally has some groundglass opacities concerning for pulmonary contusion under the circumstances.  As documented above, the patient was satting about 92% on room air.  Whether this is due to the pulmonary contusion, splinting from the pain, or combination, the patient would benefit from in-hospital observation over the next 24 hours or more to make sure he does not decompensate in the setting of all his chronic medical issues, the pulmonary contusion, etc.  I talked with the  patient and his wife who is at bedside and they both agree with the plan.  I will consult the hospitalist for admission. [CF]  0220 Consulted by phone with Dr. Para March with the hospitalist service who will admit the patient. [CF]    Clinical Course User Index [CF] Loleta Rose, MD     FINAL CLINICAL IMPRESSION(S) / ED DIAGNOSES   Final diagnoses:  Closed fracture of multiple ribs of right side, initial encounter  Contusion of right lung, initial encounter  Long term current use of anticoagulant     Rx / DC Orders   ED Discharge Orders     None        Note:  This document was prepared using Dragon voice recognition software and may include unintentional dictation errors.   Loleta Rose, MD 07/07/22 [phone]

## 2022-07-07 NOTE — Assessment & Plan Note (Signed)
Morbid obesity given comorbidities Complicating factor to overall prognosis and care

## 2022-07-07 NOTE — Assessment & Plan Note (Signed)
Continue azithromycin 500 mg 4/11-- 4/16

## 2022-07-07 NOTE — Assessment & Plan Note (Signed)
Not on beta-blockers Resume Eliquis once stable from pulmonary contusion

## 2022-07-07 NOTE — Assessment & Plan Note (Signed)
Patient recently completed IV ertapenem for treatment of Serratia bacteremia No acute concerns at this time regarding recent infection

## 2022-07-07 NOTE — Assessment & Plan Note (Signed)
History of renal transplant 2012 Chronic immunosuppressant Renal function at baseline - Continue mycophenolate prednisone and Prograf

## 2022-07-07 NOTE — Assessment & Plan Note (Signed)
Right pulmonary contusion, chronic anticoagulation Accidental fall Mild hypoxia - Pain control - Supplemental oxygen to keep sats over 94% - Will hold Eliquis tonight in view of pulmonary contusion - Incentive spirometer

## 2022-07-08 ENCOUNTER — Inpatient Hospital Stay: Payer: Commercial Managed Care - PPO

## 2022-07-08 ENCOUNTER — Other Ambulatory Visit: Payer: Self-pay

## 2022-07-08 DIAGNOSIS — W19XXXA Unspecified fall, initial encounter: Secondary | ICD-10-CM | POA: Diagnosis not present

## 2022-07-08 DIAGNOSIS — I482 Chronic atrial fibrillation, unspecified: Secondary | ICD-10-CM | POA: Diagnosis not present

## 2022-07-08 DIAGNOSIS — S27321A Contusion of lung, unilateral, initial encounter: Secondary | ICD-10-CM

## 2022-07-08 DIAGNOSIS — S2241XA Multiple fractures of ribs, right side, initial encounter for closed fracture: Secondary | ICD-10-CM | POA: Diagnosis not present

## 2022-07-08 LAB — CBC
HCT: 37 % — ABNORMAL LOW (ref 39.0–52.0)
Hemoglobin: 10.6 g/dL — ABNORMAL LOW (ref 13.0–17.0)
MCH: 23.6 pg — ABNORMAL LOW (ref 26.0–34.0)
MCHC: 28.6 g/dL — ABNORMAL LOW (ref 30.0–36.0)
MCV: 82.4 fL (ref 80.0–100.0)
Platelets: 150 10*3/uL (ref 150–400)
RBC: 4.49 MIL/uL (ref 4.22–5.81)
RDW: 19.7 % — ABNORMAL HIGH (ref 11.5–15.5)
WBC: 5 10*3/uL (ref 4.0–10.5)
nRBC: 0 % (ref 0.0–0.2)

## 2022-07-08 LAB — COMPREHENSIVE METABOLIC PANEL
ALT: 21 U/L (ref 0–44)
AST: 21 U/L (ref 15–41)
Albumin: 3.2 g/dL — ABNORMAL LOW (ref 3.5–5.0)
Alkaline Phosphatase: 119 U/L (ref 38–126)
Anion gap: 9 (ref 5–15)
BUN: 70 mg/dL — ABNORMAL HIGH (ref 8–23)
CO2: 36 mmol/L — ABNORMAL HIGH (ref 22–32)
Calcium: 9.1 mg/dL (ref 8.9–10.3)
Chloride: 99 mmol/L (ref 98–111)
Creatinine, Ser: 2.46 mg/dL — ABNORMAL HIGH (ref 0.61–1.24)
GFR, Estimated: 29 mL/min — ABNORMAL LOW (ref >60)
Glucose, Bld: 80 mg/dL (ref 70–99)
Potassium: 4.1 mmol/L (ref 3.5–5.1)
Sodium: 144 mmol/L (ref 135–145)
Total Bilirubin: 0.9 mg/dL (ref 0.3–1.2)
Total Protein: 7.1 g/dL (ref 6.5–8.1)

## 2022-07-08 LAB — GLUCOSE, CAPILLARY: Glucose-Capillary: 95 mg/dL (ref 70–99)

## 2022-07-08 MED ORDER — CYCLOBENZAPRINE HCL 5 MG PO TABS
5.0000 mg | ORAL_TABLET | Freq: Three times a day (TID) | ORAL | 0 refills | Status: AC | PRN
  Filled 2022-07-08: qty 9, 3d supply, fill #0

## 2022-07-08 MED ORDER — OXYCODONE HCL 5 MG PO TABS
5.0000 mg | ORAL_TABLET | Freq: Four times a day (QID) | ORAL | 0 refills | Status: AC | PRN
  Filled 2022-07-08: qty 12, 3d supply, fill #0

## 2022-07-08 MED ORDER — ORAL CARE MOUTH RINSE: 15.0000 mL | OROMUCOSAL | Status: DC | PRN

## 2022-07-08 MED ORDER — ACETAMINOPHEN 500 MG PO TABS
1000.0000 mg | ORAL_TABLET | Freq: Four times a day (QID) | ORAL | 0 refills | Status: AC
  Filled 2022-07-08: qty 24, 3d supply, fill #0

## 2022-07-08 MED ORDER — APIXABAN 5 MG PO TABS: 5.0000 mg | ORAL_TABLET | Freq: Two times a day (BID) | ORAL | 1 refills | Status: DC

## 2022-07-08 NOTE — Transitions of Care (Post Inpatient/ED Visit) (Signed)
   07/08/2022  Name: [name]. MRN: [id number] DOB: 1961/03/07  Today's TOC FU Call Status: Today's TOC FU Call Status:: Unsuccessful Call (2nd Attempt) Unsuccessful Call (1st Attempt) Date: 07/01/22 Unsuccessful Call (2nd Attempt) Date: 07/08/22  Attempted to reach the patient regarding the most recent Inpatient/ED visit.  Follow Up Plan: Additional outreach attempts will be made to reach the patient to complete the Transitions of Care (Post Inpatient/ED visit) call.   Agnes Lawrence, CMA (AAMA)  [facility] [phone]

## 2022-07-08 NOTE — Progress Notes (Signed)
[facility] SURGICAL PROGRESS NOTE (cpt [phone])  Hospital Day(s): 1.   Interval History: Patient seen and examined, no acute events or new complaints overnight. Patient reports he is doing better; still with right anterior rib pain. Worse with deep inspiration. He remains without leukocytosis; 5.0K. Hgb to 10.6; stable. Renal function stable; sCr - 2.46; UO - 1125 ccs. No electrolyte derangements.   Review of Systems:  Constitutional: denies fever, chills  HEENT: denies cough or congestion  Respiratory:+ shortness of breath (secondary to pain); denied cough  Cardiovascular: denies chest pain or palpitations  Genitourinary: denies burning with urination or urinary frequency Musculoskeletal: + right rib pain; denies decreased motor or sensation  Vital signs in last 24 hours: [min-max] current  Temp:  [97.6 F (36.4 C)-98.5 F (36.9 C)] 98 F (36.7 C) (04/15 0500) Pulse Rate:  [65-80] 67 (04/15 0500) Resp:  [11-19] 15 (04/15 0645) BP: (115-139)/(65-84) 116/79 (04/15 0500) SpO2:  [92 %-100 %] 93 % (04/15 0500) FiO2 (%):  [21 %] 21 % (04/15 0500)     Height: 5\' 10"  (177.8 cm) Weight: 114.8 kg BMI (Calculated): 36.3   Intake/Output last 2 shifts:  04/14 0701 - 04/15 0700 In: 820 [P.O.:720; IV Piggyback:100] Out: 1125 [Urine:1125]   Physical Exam:  Constitutional: alert, cooperative and no distress  HENT: normocephalic without obvious abnormality  Eyes: PERRL, EOM's grossly intact and symmetric  Respiratory: CTAB; shallow inspiratory effort Cardiovascular: regular rate and sinus rhythm  Chest: Right anterior chest pain to palpation    Labs:     Latest Ref Rng & Units 07/08/2022    6:33 AM 07/07/2022    4:35 AM 07/06/2022    8:35 PM  CBC  WBC 4.0 - 10.5 K/uL 5.0  6.2  7.4   Hemoglobin 13.0 - 17.0 g/dL 40.3  47.4  25.9   Hematocrit 39.0 - 52.0 % 37.0  36.1  38.8   Platelets 150 - 400 K/uL 150  169  179       Latest Ref Rng & Units 07/08/2022    6:33 AM  07/07/2022    4:35 AM 07/06/2022    8:35 PM  CMP  Glucose 70 - 99 mg/dL 80  563  875   BUN 8 - 23 mg/dL 70  69  73   Creatinine 0.61 - 1.24 mg/dL 6.43  3.29  5.18   Sodium 135 - 145 mmol/L 144  143  138   Potassium 3.5 - 5.1 mmol/L 4.1  3.8  4.3   Chloride 98 - 111 mmol/L 99  97  94   CO2 22 - 32 mmol/L 36  35  32   Calcium 8.9 - 10.3 mg/dL 9.1  8.8  8.9   Total Protein 6.5 - 8.1 g/dL 7.1   7.7   Total Bilirubin 0.3 - 1.2 mg/dL 0.9   0.7   Alkaline Phos 38 - 126 U/L 119   160   AST 15 - 41 U/L 21   49   ALT 0 - 44 U/L 21   33      Imaging studies:   CXR (07/08/2022) personally reviewed without pneumothorax, known rib fractures, and radiologist report reviewed below:  IMPRESSION: 1. Known nondisplaced right third through fifth rib fractures not well demonstrated by plain film examination. 2. Cardiomegaly with pulmonary venous congestion, but no frank pulmonary edema.    Assessment/Plan:  62 y.o. male with right 3rd-5th rib fractures s/p mechanical fall without pneumothorax nor hemothorax, complicated by pertinent comorbidities including  need for anticoagulation.   - CXR reassuring without PTX nor hemothorax  - No role for chest tube  - Continue pain control  - Continue pulmonary toilet; IS use at home  - Mobilize   - Further management per primary service   - Okay for discharge from surgical perspective; He can follow up with [name] as needed as outpatient    All of the above findings and recommendations were discussed with the patient, and the medical team, and all of patient's questions were answered to his expressed satisfaction.  -- Lynden Oxford, PA-C [facility] 07/08/2022, 7:53 AM M-F: 7am - 4pm

## 2022-07-08 NOTE — Plan of Care (Signed)
Patient remains in Cardiac PCU. CPAP overnight. No infusions. No devices.   Problem: Fluid Volume: Goal: Hemodynamic stability will improve Outcome: Progressing   Problem: Clinical Measurements: Goal: Diagnostic test results will improve Outcome: Progressing Goal: Signs and symptoms of infection will decrease Outcome: Progressing   Problem: Respiratory: Goal: Ability to maintain adequate ventilation will improve Outcome: Progressing   Problem: Education: Goal: Knowledge of General Education information will improve Description: Including pain rating scale, medication(s)/side effects and non-pharmacologic comfort measures Outcome: Progressing   Problem: Health Behavior/Discharge Planning: Goal: Ability to manage health-related needs will improve Outcome: Progressing   Problem: Clinical Measurements: Goal: Ability to maintain clinical measurements within normal limits will improve Outcome: Progressing Goal: Will remain free from infection Outcome: Progressing Goal: Diagnostic test results will improve Outcome: Progressing Goal: Respiratory complications will improve Outcome: Progressing Goal: Cardiovascular complication will be avoided Outcome: Progressing   Problem: Activity: Goal: Risk for activity intolerance will decrease Outcome: Progressing   Problem: Nutrition: Goal: Adequate nutrition will be maintained Outcome: Progressing   Problem: Coping: Goal: Level of anxiety will decrease Outcome: Progressing   Problem: Elimination: Goal: Will not experience complications related to bowel motility Outcome: Progressing Goal: Will not experience complications related to urinary retention Outcome: Progressing   Problem: Pain Managment: Goal: General experience of comfort will improve Outcome: Progressing   Problem: Safety: Goal: Ability to remain free from injury will improve Outcome: Progressing   Problem: Skin Integrity: Goal: Risk for impaired skin  integrity will decrease Outcome: Progressing

## 2022-07-08 NOTE — Discharge Summary (Signed)
Physician Discharge Summary   Patient: [name]. MRN: [id number] DOB: Mar 09, 1961  Admit date:     07/06/2022  Discharge date: 07/08/22  Discharge Physician: Delfino Lovett   PCP: [name], MD   Recommendations at discharge:   Follow-up with outpatient providers as requested  Discharge Diagnoses: Principal Problem:   Right pulmonary contusion Active Problems:   Multiple closed fractures of ribs of right side   Accidental fall   Uncontrolled type 2 diabetes mellitus with hyperglycemia, with long-term current use of insulin   Chronic anticoagulation   Recent Serratia bacteremia secondary to pyelonephritis s/p IV ertapenem 3/29 - 07/05/2022   Campylobacter diarrhea   Obstructive sleep apnea   Renal transplant recipient   CKD (chronic kidney disease) stage 4, GFR 15-29 ml/min   CAD s/p CABG 2005   Chronic combined systolic and diastolic CHF (congestive heart failure)   Essential hypertension   Chronic atrial fibrillation   Immunosuppressed status   Obesity with body mass index of 30.0-39.9   Ischemic cardiomyopathy  Hospital Course: Assessment and Plan: Multiple closed fractures of ribs of right side Right pulmonary contusion, chronic anticoagulation Accidental fall Mild hypoxia - His pain is well-controlled and maintaining oxygenation. - Requested to hold Eliquis for 2 more days 05/11/2022 and resumed on 07/11/2022 in view of pulmonary contusion - He is requested to continue using incentive spirometer at home -He prefers to go home as he is feeling much better -Surgery had seen him and cleared.  His repeat chest x-ray showed no pneumothorax/hemothorax and is reassuring  Uncontrolled type 2 diabetes mellitus with hyperglycemia, with long-term current use of insulin  Campylobacter diarrhea His diarrhea has resolved now.  He is requested to finish his course of antibiotics  Recent Serratia bacteremia secondary to pyelonephritis s/p IV ertapenem 3/29 -  07/05/2022 Patient recently completed IV ertapenem for treatment of Serratia bacteremia No acute concerns at this time regarding recent infection  CKD (chronic kidney disease) stage 4, GFR 15-29 ml/min History of renal transplant 2012 Chronic immunosuppressant Renal function at baseline - Continue mycophenolate prednisone and Prograf  Obstructive sleep apnea Pulmonary arterial hypertension Hypoxia to low 90s Continue supplemental O2 to keep sats over 94% CPAP nightly  CAD s/p CABG 2005 Continue Imdur, rosuvastatin.  Holding apixaban overnight pending stability of pulmonary contusion  Chronic combined systolic and diastolic CHF (congestive heart failure) Ischemic cardiomyopathy (EF 40 to 45% 03/2022) Clinically euvolemic Continue torsemide, metolazone, losartan, Imdur and hydralazine  Essential hypertension Slightly elevated, likely from pain Continue home antihypertensives  Chronic atrial fibrillation Not on beta-blockers Resume Eliquis on 07/11/2022 once stable from pulmonary contusion  Obesity with body mass index of 30.0-39.9 Morbid obesity given comorbidities Complicating factor to overall prognosis and care         Consultants: Surgery Disposition: Home Diet recommendation:  Discharge Diet Orders (From admission, onward)     Start     Ordered   07/08/22 0000  Diet - low sodium heart healthy        07/08/22 0843           Carb modified diet DISCHARGE MEDICATION: Allergies as of 07/08/2022       Reactions   Contrast Media [iodinated Contrast Media] Other (See Comments)   Kidney transplant   Iodine Other (See Comments)   Other reaction(s): Other (See Comments) Kidney transplant   Ibuprofen Other (See Comments)   Due to kidney transplant Due to kidney transplant Due to kidney transplant  Medication List     STOP taking these medications    azithromycin 500 MG tablet Commonly known as: Zithromax   HYDROcodone-acetaminophen 5-325 MG  tablet Commonly known as: NORCO/VICODIN       TAKE these medications    acetaminophen 500 MG tablet Commonly known as: TYLENOL Take 2 tablets (1,000 mg total) by mouth every 6 (six) hours for 3 days.   albuterol (2.5 MG/3ML) 0.083% nebulizer solution Commonly known as: PROVENTIL Inhale one vial (3 ml) via nebulizer every 6 hours as needed for wheezing or shortness of breath (Take 3 mLs (2.5 mg total) by nebulization every 6 (six) hours as needed for wheezing or shortness of breath.)   albuterol 108 (90 Base) MCG/ACT inhaler Commonly known as: VENTOLIN HFA Inhale 2 puffs into the lungs every 6 (six) hours as needed for wheezing or shortness of breath.   AMBULATORY NON FORMULARY MEDICATION Trimix (30/1/10)-(Pap/Phent/PGE)  Test Dose  1ml vial   Qty #3 Refills 0  [facility] [phone] Fax [phone]   apixaban 5 MG Tabs tablet Commonly known as: ELIQUIS Take 1 tablet (5 mg total) by mouth 2 (two) times daily. Hold till 07/10/2022 and restart on 07/11/22 Start taking on: July 11, 2022 What changed:  how much to take additional instructions These instructions start on July 11, 2022. If you are unsure what to do until then, ask your doctor or other care provider.   buPROPion 150 MG 12 hr tablet Commonly known as: WELLBUTRIN SR Take 1 tablet (150 mg total) by mouth 2 (two) times daily.   cholecalciferol 25 MCG (1000 UNIT) tablet Commonly known as: VITAMIN D3 Take 1,000 Units by mouth at bedtime.   colchicine 0.6 MG tablet Take 1 tablet (0.6 mg total) by mouth every other day.   cyanocobalamin 100 MCG tablet Take 100 mcg by mouth daily.   cyclobenzaprine 5 MG tablet Commonly known as: FLEXERIL Take 1 tablet (5 mg total) by mouth 3 (three) times daily as needed for up to 3 days for muscle spasms.   Farxiga 10 MG Tabs tablet Generic drug: dapagliflozin propanediol Take 1 tablet (10 mg total) by mouth daily before breakfast.   febuxostat 40 MG  tablet Commonly known as: ULORIC Take 1 tablet (40 mg total) by mouth daily.   fluticasone 50 MCG/ACT nasal spray Commonly known as: FLONASE PLACE 2 SPRAYS INTO BOTH NOSTRILS DAILY.   FreeStyle Libre 3 Sensor Misc Use one every 2 weeks.   gabapentin 300 MG capsule Commonly known as: NEURONTIN Take 1 capsule (300 mg total) by mouth 2 (two) times daily.   hydrALAZINE 25 MG tablet Commonly known as: APRESOLINE Take 1 tablet (25 mg total) by mouth every 8 (eight) hours.   hydrocortisone 2.5 % cream Apply topically 3 (three) times daily as needed.   insulin lispro 100 UNIT/ML KwikPen Commonly known as: HumaLOG KwikPen Inject 10-20 Units into the skin 3 (three) times daily. Per sliding scale   iron polysaccharides 150 MG capsule Commonly known as: Ferrex 150 Take 1 capsule (150 mg total) by mouth daily.   isosorbide mononitrate 30 MG 24 hr tablet Commonly known as: IMDUR Take 1 tablet (30 mg total) by mouth daily.   lidocaine 5 % Commonly known as: LIDODERM Place 1 patch onto the skin daily.PLACE 1 PATCH ONTO THE SKIN FOR 12 (TWELVE) HOURS. REMOVE & DISCARD PATCH WITHIN 12 HOURS OR AS DIRECTED BY MD What changed:  when to take this reasons to take this   lidocaine 5 %  ointment Commonly known as: XYLOCAINE Apply 1 Application topically 3 (three) times daily as needed. Apply pea size amount to area as needed for pain. What changed: Another medication with the same name was changed. Make sure you understand how and when to take each.   losartan 50 MG tablet Commonly known as: COZAAR Take 50 mg by mouth daily.   metolazone 2.5 MG tablet Commonly known as: ZAROXOLYN Take 1 tablet by mouth 3 times weekly.   montelukast 10 MG tablet Commonly known as: SINGULAIR TAKE 1 TABLET BY MOUTH AT BEDTIME. What changed: how much to take   multivitamin with minerals Tabs tablet Take 1 tablet by mouth daily.   mycophenolate 180 MG EC tablet Commonly known as: MYFORTIC Take 2  tablets (360 mg total) by mouth 2 (two) times daily.   omeprazole 20 MG capsule Commonly known as: PRILOSEC TAKE 1 CAPSULE BY MOUTH DAILY. What changed:  how much to take when to take this   oxyCODONE 5 MG immediate release tablet Commonly known as: Oxy IR/ROXICODONE Take 1 tablet (5 mg total) by mouth every 6 (six) hours as needed for up to 3 days for moderate pain, breakthrough pain or severe pain.   potassium chloride 10 MEQ tablet Commonly known as: KLOR-CON Take 4 tablets (40 mEq total) by mouth daily.   predniSONE 5 MG tablet Commonly known as: DELTASONE Take 1 tablet (5 mg total) by mouth daily.   rosuvastatin 10 MG tablet Commonly known as: CRESTOR Take 1 tablet (10 mg total) by mouth daily. What changed: when to take this   Semglee (yfgn) 100 UNIT/ML Pen Generic drug: insulin glargine-yfgn Inject 35 Units into the skin daily. What changed: when to take this   Semglee (yfgn) 100 UNIT/ML Pen Generic drug: insulin glargine-yfgn Inject 40 Units into the skin daily. What changed: Another medication with the same name was changed. Make sure you understand how and when to take each.   sulfamethoxazole-trimethoprim 400-80 MG tablet Commonly known as: BACTRIM TAKE 1 TABLET BY MOUTH EVERY MONDAY, WEDNESDAY, FRIDAY.   tacrolimus 1 MG capsule Commonly known as: PROGRAF Take 2 capsules (2 mg total) by mouth 2 times daily.   tamsulosin 0.4 MG Caps capsule Commonly known as: FLOMAX Take 1 capsule (0.4 mg total) by mouth daily.   Testosterone 10 MG/ACT (2%) Gel Apply 1 Pump topically daily to inner thighs - 2 pumps total.   torsemide 20 MG tablet Commonly known as: DEMADEX Take 5 tablets (100 mg total) by mouth daily.   Unifine Pentips 31G X 5 MM Misc Generic drug: Insulin Pen Needle Use 4 times daily as directed   zinc sulfate 220 (50 Zn) MG capsule Take 220 mg by mouth daily.        Follow-up Information     [name], MD. Schedule an appointment  as soon as possible for a visit in 3 day(s).   Specialties: Internal Medicine, Pediatrics Why: [facility] Discharge F/UP Contact information: [address] [place] [address] [phone]         Iran Ouch, MD. Go in 2 week(s).   Specialty: Cardiology Why: Appointment on Friday, 07/26/2022 at 8:50am. Please arrive 15 minutes early. Contact information: [address] [place] [address] [phone]         [facility] Follow up.   Specialty: General Surgery Why: As needed Contact information: [address] [place] [address] [phone]  Discharge Exam: Filed Weights   07/06/22 2031  Weight: 57.26 kg   62 year old male sitting in the chair comfortably without any acute distress Chest : Right anterior/lateral chest wall tenderness Lungs clear to auscultation bilaterally Heart regular rate and rhythm Abdomen soft, benign Neuro alert and awake, nonfocal Psych normal mood and affect  Condition at discharge: good  The results of significant diagnostics from this hospitalization (including imaging, microbiology, ancillary and laboratory) are listed below for reference.   Imaging Studies: DG Chest 2 View  Result Date: 07/08/2022 CLINICAL DATA:  62 year old male history of rib fractures. EXAM: CHEST - 2 VIEW COMPARISON:  Chest x-ray 07/06/2022. FINDINGS: Lung volumes are normal. No consolidative airspace disease. No pleural effusions. No pneumothorax. No pulmonary nodule or mass noted. Pulmonary vasculature is mildly engorged, without frank pulmonary edema. Heart size is mildly enlarged. Calcified mediastinal lymph nodes are again noted. Upper mediastinal contours are within normal limits. Status post median sternotomy for CABG. Multiple known nondisplaced right third through fifth rib fractures are not well demonstrated on today's plain film  examination. IMPRESSION: 1. Known nondisplaced right third through fifth rib fractures not well demonstrated by plain film examination. 2. Cardiomegaly with pulmonary venous congestion, but no frank pulmonary edema. Electronically Signed   By: Trudie Reed M.D.   On: 07/08/2022 07:33   CT Cervical Spine Wo Contrast  Result Date: 07/07/2022 CLINICAL DATA:  Trauma/fall EXAM: CT CERVICAL SPINE WITHOUT CONTRAST TECHNIQUE: Multidetector CT imaging of the cervical spine was performed without intravenous contrast. Multiplanar CT image reconstructions were also generated. RADIATION DOSE REDUCTION: This exam was performed according to the departmental dose-optimization program which includes automated exposure control, adjustment of the mA and/or kV according to patient size and/or use of iterative reconstruction technique. COMPARISON:  None Available. FINDINGS: Alignment: Normal cervical lordosis. Skull base and vertebrae: No acute fracture. No primary bone lesion or focal pathologic process. Soft tissues and spinal canal: No prevertebral fluid or swelling. No visible canal hematoma. Disc levels: Right paracentral disc osteophyte complexes C3-4 (series 3/image 51) and diffuse posterior disc osteophyte complex at C4-5 (series 3/image 89), with resultant severe spinal canal stenosis, cord flattening, and severe bilateral neural foraminal narrowing. Upper chest: Visualized lung apices are clear. Other: Visualized thyroid is unremarkable. IMPRESSION: No traumatic injury to the cervical spine. Disc osteophyte complexes with severe spinal canal stenosis and bilateral neural foraminal narrowing at C3-4 and C4-5, as above. Consider follow-up MR cervical spine without contrast for further evaluation, as clinically warranted. Electronically Signed   By: Charline Bills M.D.   On: 07/07/2022 01:11   CT Chest Wo Contrast  Result Date: 07/07/2022 CLINICAL DATA:  Fall, right chest pain EXAM: CT CHEST WITHOUT CONTRAST  TECHNIQUE: Multidetector CT imaging of the chest was performed following the standard protocol without IV contrast. RADIATION DOSE REDUCTION: This exam was performed according to the departmental dose-optimization program which includes automated exposure control, adjustment of the mA and/or kV according to patient size and/or use of iterative reconstruction technique. COMPARISON:  06/21/2022 FINDINGS: Cardiovascular: Cardiomegaly.  No pericardial effusion. No evidence of thoracic aortic aneurysm. Atherosclerotic calcifications of the aortic arch. Severe three-vessel coronary atherosclerosis. Postsurgical changes related to prior CABG. Mediastinum/Nodes: Densely calcified 2.6 cm short axis right paratracheal node (series 1/image 62), chronic. Small calcified right perihilar nodes, chronic. No suspicious mediastinal lymphadenopathy. Visualized thyroid is unremarkable. Lungs/Pleura: Scattered calcified granulomata. No suspicious pulmonary nodules. Faint ground-glass peribronchovascular opacity in the inferolateral right upper lobe (series 3/image 86), suggesting mild pulmonary contusion in  this clinical setting. No focal consolidation. No pleural effusion or pneumothorax. Upper Abdomen: Visualized upper abdomen is notable for a mildly nodular hepatic contour, mild splenomegaly, sleeve gastrectomy, and extensive vascular calcifications. Musculoskeletal: Nondisplaced right lateral 3rd through 5th rib fractures. Sternum, clavicles, and scapulae are intact. Degenerative changes of the visualized thoracolumbar spine. IMPRESSION: Nondisplaced right lateral 3rd through 5th rib fractures. No pneumothorax. Faint ground-glass opacity in the inferolateral right upper lobe, suggesting mild pulmonary contusion in this clinical setting. Additional ancillary findings as above. Aortic Atherosclerosis (ICD10-I70.0). Electronically Signed   By: Charline Bills M.D.   On: 07/07/2022 01:01   CT HEAD WO CONTRAST  Result Date:  07/06/2022 CLINICAL DATA:  Head trauma, minor, normal mental status (Age 9-64y) on blood thinner EXAM: CT HEAD WITHOUT CONTRAST TECHNIQUE: Contiguous axial images were obtained from the base of the skull through the vertex without intravenous contrast. RADIATION DOSE REDUCTION: This exam was performed according to the departmental dose-optimization program which includes automated exposure control, adjustment of the mA and/or kV according to patient size and/or use of iterative reconstruction technique. COMPARISON:  CT head 06/21/2022 FINDINGS: Brain: Patchy and confluent areas of decreased attenuation are noted throughout the deep and periventricular white matter of the cerebral hemispheres bilaterally, compatible with chronic microvascular ischemic disease. No evidence of large-territorial acute infarction. No parenchymal hemorrhage. No mass lesion. No extra-axial collection. No mass effect or midline shift. No hydrocephalus. Basilar cisterns are patent. Vascular: No hyperdense vessel. Atherosclerotic calcifications are present within the cavernous internal carotid and vertebral arteries. Skull: No acute fracture or focal lesion. Sinuses/Orbits: Paranasal sinuses and mastoid air cells are clear. Bilateral lens replacement. Otherwise the orbits are unremarkable. Other: None. IMPRESSION: No acute intracranial abnormality. Electronically Signed   By: Tish Frederickson M.D.   On: 07/06/2022 21:09   DG Chest 1 View  Result Date: 07/06/2022 CLINICAL DATA:  [id number] Rib pain [id number] EXAM: CHEST  1 VIEW COMPARISON:  Chest x-ray 06/22/2022, CT chest 06/21/2022 FINDINGS: The heart and mediastinal contours are unchanged. Vascular clips overlie the mediastinum. Persistent prominent hilar vasculature. No focal consolidation. Chronic coarsened interstitial markings. No pleural effusion. No pneumothorax. No acute osseous abnormality.  Sternotomy wires are intact. IMPRESSION: Low lung volumes with pulmonary venous congestion.  Electronically Signed   By: Tish Frederickson M.D.   On: 07/06/2022 21:05   DG Chest Port 1 View  Result Date: 06/22/2022 CLINICAL DATA:  Shortness of breath.  Admitted for pyelonephritis. EXAM: PORTABLE CHEST 1 VIEW COMPARISON:  Chest radiographs 06/21/2022 and 04/02/2022. CT 06/21/2022. FINDINGS: 1131 hours. The heart size and mediastinal contours are stable status post median sternotomy and CABG. Chronic vascular congestion without overt pulmonary edema or confluent airspace opacity. There is no pleural effusion or pneumothorax. The bones appear unchanged.  Telemetry leads overlie the chest. IMPRESSION: Stable examination. Chronic vascular congestion without overt edema or confluent airspace opacity. Electronically Signed   By: Carey Bullocks M.D.   On: 06/22/2022 11:48   US Renal Transplant w/Doppler  Result Date: 06/21/2022 CLINICAL DATA:  Acute kidney failure, urinary tract infection EXAM: ULTRASOUND OF RENAL TRANSPLANT WITH RENAL DOPPLER ULTRASOUND TECHNIQUE: Ultrasound examination of the renal transplant was performed with gray-scale, color and duplex doppler evaluation. COMPARISON:  CT today FINDINGS: Transplant kidney location: Right lower quadrant Transplant Kidney: Renal measurements: 14.0 x 6.7 x 6.1 cm = volume: . Normal size and echotexture. Mild hydronephrosis in the lower pole. Color flow in the main renal artery:  Yes Color flow in the main renal vein:  Yes Duplex Doppler Evaluation: Main Renal Artery Resistive Index: 0.84 Venous waveform in main renal vein:  Present Intrarenal resistive index in upper pole:  0.79 (normal 0.6-0.8; equivocal 0.8-0.9; abnormal >= 0.9) Intrarenal resistive index in lower pole: 0.8 (normal 0.6-0.8; equivocal 0.8-0.9; abnormal >= 0.9) Bladder: Not visualized Other findings:  None. IMPRESSION: Mild fullness of the collecting system in the lower pole of the right lower quadrant renal transplant. Electronically Signed   By: Charlett Nose M.D.   On: 06/21/2022  18:34   CT CHEST ABDOMEN PELVIS WO CONTRAST  Result Date: 06/21/2022 CLINICAL DATA:  Fever with hematuria.  Status post renal transplant. EXAM: CT CHEST, ABDOMEN AND PELVIS WITHOUT CONTRAST TECHNIQUE: Multidetector CT imaging of the chest, abdomen and pelvis was performed following the standard protocol without IV contrast. RADIATION DOSE REDUCTION: This exam was performed according to the departmental dose-optimization program which includes automated exposure control, adjustment of the mA and/or kV according to patient size and/or use of iterative reconstruction technique. COMPARISON:  CT stone study 01/03/2022. FINDINGS: CT CHEST FINDINGS Cardiovascular: The heart is enlarged. Coronary artery calcification is evident. Status post CABG. Mild atherosclerotic calcification is noted in the wall of the thoracic aorta. Enlargement of the pulmonary outflow tract/main pulmonary arteries suggests pulmonary arterial hypertension. Mediastinum/Nodes: Densely calcified right paratracheal lymph node or nodule is stable since 07/01/2019 consistent with benign etiology. Calcified nodal tissue is seen in the right hilum. The esophagus has normal imaging features. There is no axillary lymphadenopathy. Lungs/Pleura: Fine architectural detail of the lungs is obscured bilaterally by breathing motion. 2.9 x 1.0 cm nodular consolidative opacity in the right lower lobe (86/4) is stable since prior. This is stable since chest CT 07/01/2019 consistent with benign etiology. No new suspicious pulmonary nodule or mass. No focal airspace consolidation. No pleural effusion. Musculoskeletal: No worrisome lytic or sclerotic osseous abnormality. CT ABDOMEN PELVIS FINDINGS Hepatobiliary: No suspicious focal abnormality in the liver on this study without intravenous contrast. Subtle nodularity of liver contour suggests cirrhosis. Gallbladder is not visualized. No intrahepatic or extrahepatic biliary dilation. Pancreas: No focal mass lesion. No  dilatation of the main duct. No intraparenchymal cyst. No peripancreatic edema. Spleen: Mild splenomegaly. Adrenals/Urinary Tract: No adrenal nodule or mass. Native kidneys are atrophic bilaterally. Small exophytic cyst lower pole right kidney is unchanged. No followup imaging is recommended. 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 No hydronephrosis in the upper pole collecting system although inferior moiety demonstrates mild hydronephrosis, new in the interval with mild fullness of the ureter of the lower pole Mighty down to the level of the ureteral confluence. Bladder is nondistended which likely accentuates the mild circumferential bladder wall thickening. Stomach/Bowel: Surgical changes in the stomach compatible with sleeve gastrectomy. Duodenum is normally positioned as is the ligament of Treitz. No small bowel wall thickening. No small bowel dilatation. The terminal ileum is normal. The appendix is normal. No gross colonic mass. No colonic wall thickening. Vascular/Lymphatic: There is mild atherosclerotic calcification of the abdominal aorta without aneurysm. There is no gastrohepatic or hepatoduodenal ligament lymphadenopathy. No retroperitoneal or mesenteric lymphadenopathy. No pelvic sidewall lymphadenopathy. Reproductive: Sequelae of urolift procedure noted in the prostate. Other: No intraperitoneal free fluid. Musculoskeletal: No worrisome lytic or sclerotic osseous abnormality. IMPRESSION: 1. No acute findings in the chest. 2. 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 No hydronephrosis in the upper pole collecting system although inferior moiety demonstrates mild hydronephrosis, new in the interval with mild fullness of the  lower pole ureter to the level of the ureteral confluence. No obstructing stone or mass lesion evident. 3. Enlargement of the pulmonary outflow tract/main pulmonary  arteries suggests pulmonary arterial hypertension. 4. Stable 2.9 x 1.0 cm nodular consolidative opacity in the right lower lobe since chest CT 07/01/2019 consistent with benign etiology, potentially scarring. 5. Subtle changes in the liver suggesting cirrhosis and stable mild splenomegaly would be compatible with portal venous hypertension. 6.  Aortic Atherosclerosis (ICD10-I70.0). Electronically Signed   By: Kennith Center M.D.   On: 06/21/2022 10:01   CT HEAD WO CONTRAST ( )  Result Date: 06/21/2022 CLINICAL DATA:  Confusion.  Headache. EXAM: CT HEAD WITHOUT CONTRAST TECHNIQUE: Contiguous axial images were obtained from the base of the skull through the vertex without intravenous contrast. RADIATION DOSE REDUCTION: This exam was performed according to the departmental dose-optimization program which includes automated exposure control, adjustment of the mA and/or kV according to patient size and/or use of iterative reconstruction technique. COMPARISON:  11/05/2018 FINDINGS: Brain: There is no evidence for acute hemorrhage, hydrocephalus, mass lesion, or abnormal extra-axial fluid collection. No definite CT evidence for acute infarction. Patchy low attenuation in the deep hemispheric and periventricular white matter is nonspecific, but likely reflects chronic microvascular ischemic demyelination. Vascular: No hyperdense vessel or unexpected calcification. Skull: No evidence for fracture. No worrisome lytic or sclerotic lesion. Sinuses/Orbits: The visualized paranasal sinuses and right mastoid air cells are clear. Stable trace inferior left mastoid effusion. Visualized portions of the globes and intraorbital fat are unremarkable. Other: None. IMPRESSION: 1. No acute intracranial abnormality. 2. Chronic small vessel ischemic disease. 3. Stable trace inferior left mastoid effusion. Electronically Signed   By: Kennith Center M.D.   On: 06/21/2022 09:36   DG Chest Portable 1 View  Result Date:  06/21/2022 CLINICAL DATA:  Shortness of breath, fever, mild confused, blood in urine, rolling and leaning to LEFT side, past history of renal transplant EXAM: PORTABLE CHEST 1 VIEW COMPARISON:  Portable exam 0850 hours compared to 04/02/2022 FINDINGS: Enlargement of cardiac silhouette post CABG. Mediastinal contours and pulmonary vascularity normal. Atherosclerotic calcification aorta. Large calcified RIGHT paratracheal lymph node stable. Chronic elevation/eventration of RIGHT diaphragm with mild RIGHT basilar atelectasis. Small nodular foci in the LEFT mid lung appear more prominent than on previous exam. Remaining lungs clear. No pleural effusion or pneumothorax. Osseous structures unremarkable. IMPRESSION: Enlargement of cardiac silhouette post CABG. Old granulomatous disease with multiple small nodular foci in the LEFT mid lung, more prominent than on previous exam; potentially this could represent a an atypical infiltrate in a transplant patient but CT chest recommended to exclude developing pulmonary nodules. Electronically Signed   By: Ulyses Southward M.D.   On: 06/21/2022 09:03    Microbiology: Results for orders placed or performed during the hospital encounter of 06/21/22  Blood culture (routine x 2)     Status: Abnormal   Collection Time: 06/21/22  8:38 AM   Specimen: BLOOD  Result Value Ref Range Status   Specimen Description   Final    BLOOD RIGHT ARM Performed at [facility], [address] [name], [place] [address]    Special Requests   Final    NONE Performed at [facility], [address] [place], [place] [address]    Culture  Setup Time   Final    Organism ID to follow GRAM NEGATIVE RODS AEROBIC BOTTLE ONLY CRITICAL RESULT CALLED TO, READ BACK BY AND VERIFIED WITH: RAQUEL RODRIGUEZ GUZMAN AT 1422 06/22/22.PMF  REVIEWED  BY A. LAFRANCE Performed at [facility], [address], [place], [place] [address]    Culture SERRATIA MARCESCENS (A)  Final    Report Status 06/28/2022 FINAL  Final   Organism ID, Bacteria SERRATIA MARCESCENS  Final      Susceptibility   Serratia marcescens - MIC*    CEFEPIME <=0.12 SENSITIVE Sensitive     CEFTAZIDIME <=1 SENSITIVE Sensitive     CEFTRIAXONE <=0.25 SENSITIVE Sensitive     CIPROFLOXACIN 0.5 INTERMEDIATE Intermediate     GENTAMICIN <=1 SENSITIVE Sensitive     TRIMETH/SULFA >=320 RESISTANT Resistant     ERTAPENEM Value in next row Sensitive      SENSITIVE0.12    * SERRATIA MARCESCENS  Blood Culture ID Panel (Reflexed)     Status: Abnormal   Collection Time: 06/21/22  8:38 AM  Result Value Ref Range Status   Enterococcus faecalis NOT DETECTED NOT DETECTED Final   Enterococcus Faecium NOT DETECTED NOT DETECTED Final   Listeria monocytogenes NOT DETECTED NOT DETECTED Final   Staphylococcus species NOT DETECTED NOT DETECTED Final   Staphylococcus aureus (BCID) NOT DETECTED NOT DETECTED Final   Staphylococcus epidermidis NOT DETECTED NOT DETECTED Final   Staphylococcus lugdunensis NOT DETECTED NOT DETECTED Final   Streptococcus species NOT DETECTED NOT DETECTED Final   Streptococcus agalactiae NOT DETECTED NOT DETECTED Final   Streptococcus pneumoniae NOT DETECTED NOT DETECTED Final   Streptococcus pyogenes NOT DETECTED NOT DETECTED Final   A.calcoaceticus-baumannii NOT DETECTED NOT DETECTED Final   Bacteroides fragilis NOT DETECTED NOT DETECTED Final   Enterobacterales DETECTED (A) NOT DETECTED Final    Comment: Enterobacterales represent a large order of gram negative bacteria, not a single organism. CRITICAL RESULT CALLED TO, READ BACK BY AND VERIFIED WITH: RAQUEL RODRIGUEZ GUZMAN AT 1422 06/22/22.PMF    Enterobacter cloacae complex NOT DETECTED NOT DETECTED Final   Escherichia coli NOT DETECTED NOT DETECTED Final   Klebsiella aerogenes NOT DETECTED NOT DETECTED Final   Klebsiella oxytoca NOT DETECTED NOT DETECTED Final   Klebsiella pneumoniae NOT DETECTED NOT DETECTED Final   Proteus species  NOT DETECTED NOT DETECTED Final   Salmonella species NOT DETECTED NOT DETECTED Final   Serratia marcescens DETECTED (A) NOT DETECTED Final    Comment: CRITICAL RESULT CALLED TO, READ BACK BY AND VERIFIED WITH: RAQUEL RODRIGUEZ GUZMAN AT 1422 06/22/22.PMF    Haemophilus influenzae NOT DETECTED NOT DETECTED Final   Neisseria meningitidis NOT DETECTED NOT DETECTED Final   Pseudomonas aeruginosa NOT DETECTED NOT DETECTED Final   Stenotrophomonas maltophilia NOT DETECTED NOT DETECTED Final   Candida albicans NOT DETECTED NOT DETECTED Final   Candida auris NOT DETECTED NOT DETECTED Final   Candida glabrata NOT DETECTED NOT DETECTED Final   Candida krusei NOT DETECTED NOT DETECTED Final   Candida parapsilosis NOT DETECTED NOT DETECTED Final   Candida tropicalis NOT DETECTED NOT DETECTED Final   Cryptococcus neoformans/gattii NOT DETECTED NOT DETECTED Final   CTX-M ESBL NOT DETECTED NOT DETECTED Final   Carbapenem resistance IMP NOT DETECTED NOT DETECTED Final   Carbapenem resistance KPC NOT DETECTED NOT DETECTED Final   Carbapenem resistance NDM NOT DETECTED NOT DETECTED Final   Carbapenem resist OXA 48 LIKE NOT DETECTED NOT DETECTED Final   Carbapenem resistance VIM NOT DETECTED NOT DETECTED Final    Comment: Performed at [facility], [address], [place], [place] [address]  Urine Culture     Status: Abnormal   Collection Time: 06/21/22  8:48 AM   Specimen:  Urine, Clean Catch  Result Value Ref Range Status   Specimen Description   Final    URINE, CLEAN CATCH Performed at [facility], [address] [place], [place] [address]    Special Requests   Final    NONE Performed at [facility], [address] [place], [place] [address]    Culture >=100,000 COLONIES/mL SERRATIA MARCESCENS (A)  Final   Report Status 06/23/2022 FINAL  Final   Organism ID, Bacteria SERRATIA MARCESCENS (A)  Final      Susceptibility   Serratia marcescens - MIC*    CEFEPIME  <=0.12 SENSITIVE Sensitive     CEFTRIAXONE <=0.25 SENSITIVE Sensitive     CIPROFLOXACIN 0.5 INTERMEDIATE Intermediate     GENTAMICIN <=1 SENSITIVE Sensitive     NITROFURANTOIN 256 RESISTANT Resistant     TRIMETH/SULFA >=320 RESISTANT Resistant     * >=100,000 COLONIES/mL SERRATIA MARCESCENS  Resp panel by RT-PCR (RSV, Flu A&B, Covid) Anterior Nasal Swab     Status: None   Collection Time: 06/21/22  8:48 AM   Specimen: Anterior Nasal Swab  Result Value Ref Range Status   SARS Coronavirus 2 by RT PCR NEGATIVE NEGATIVE Final    Comment: (NOTE) SARS-CoV-2 target nucleic acids are NOT DETECTED.  The SARS-CoV-2 RNA is generally detectable in upper respiratory specimens during the acute phase of infection. The lowest concentration of SARS-CoV-2 viral copies this assay can detect is 138 copies/mL. A negative result does not preclude SARS-Cov-2 infection and should not be used as the sole basis for treatment or other patient management decisions. A negative result may occur with  improper specimen collection/handling, submission of specimen other than nasopharyngeal swab, presence of viral mutation(s) within the areas targeted by this assay, and inadequate number of viral copies(<138 copies/mL). A negative result must be combined with clinical observations, patient history, and epidemiological information. The expected result is Negative.  Fact Sheet for Patients:  BloggerCourse.com  Fact Sheet for Healthcare Providers:  SeriousBroker.it  This test is no t yet approved or cleared by the [place] FDA and  has been authorized for detection and/or diagnosis of SARS-CoV-2 by FDA under an Emergency Use Authorization (EUA). This EUA will remain  in effect (meaning this test can be used) for the duration of the COVID-19 declaration under Section 564(b)(1) of the Act, 21 U.S.C.section 360bbb-3(b)(1), unless the authorization is terminated   or revoked sooner.       Influenza A by PCR NEGATIVE NEGATIVE Final   Influenza B by PCR NEGATIVE NEGATIVE Final    Comment: (NOTE) The Xpert Xpress SARS-CoV-2/FLU/RSV plus assay is intended as an aid in the diagnosis of influenza from Nasopharyngeal swab specimens and should not be used as a sole basis for treatment. Nasal washings and aspirates are unacceptable for Xpert Xpress SARS-CoV-2/FLU/RSV testing.  Fact Sheet for Patients: BloggerCourse.com  Fact Sheet for Healthcare Providers: SeriousBroker.it  This test is not yet approved or cleared by the [place] FDA and has been authorized for detection and/or diagnosis of SARS-CoV-2 by FDA under an Emergency Use Authorization (EUA). This EUA will remain in effect (meaning this test can be used) for the duration of the COVID-19 declaration under Section 564(b)(1) of the Act, 21 U.S.C. section 360bbb-3(b)(1), unless the authorization is terminated or revoked.     Resp Syncytial Virus by PCR NEGATIVE NEGATIVE Final    Comment: (NOTE) Fact Sheet for Patients: BloggerCourse.com  Fact Sheet for Healthcare Providers: SeriousBroker.it  This test is not yet approved  or cleared by the [place] and has been authorized for detection and/or diagnosis of SARS-CoV-2 by FDA under an Emergency Use Authorization (EUA). This EUA will remain in effect (meaning this test can be used) for the duration of the COVID-19 declaration under Section 564(b)(1) of the Act, 21 U.S.C. section 360bbb-3(b)(1), unless the authorization is terminated or revoked.  Performed at [facility], [address] [place], [place] [address]   Culture, blood (x 2)     Status: None   Collection Time: 06/21/22  1:20 PM   Specimen: BLOOD  Result Value Ref Range Status   Specimen Description BLOOD BLOOD RIGHT ARM  Final   Special Requests    Final    BOTTLES DRAWN AEROBIC AND ANAEROBIC Blood Culture adequate volume   Culture   Final    NO GROWTH 5 DAYS Performed at [facility], [address] [place], [place] [address]    Report Status 06/26/2022 FINAL  Final  Culture, blood (x 2)     Status: None   Collection Time: 06/21/22  1:20 PM   Specimen: BLOOD  Result Value Ref Range Status   Specimen Description BLOOD BLOOD RIGHT HAND  Final   Special Requests   Final    BOTTLES DRAWN AEROBIC AND ANAEROBIC Blood Culture results may not be optimal due to an excessive volume of blood received in culture bottles   Culture   Final    NO GROWTH 5 DAYS Performed at [facility], [address], [place], [place] [address]    Report Status 06/26/2022 FINAL  Final  MRSA Next Gen by PCR, Nasal     Status: None   Collection Time: 06/21/22  1:57 PM   Specimen: Nasal Mucosa; Nasal Swab  Result Value Ref Range Status   MRSA by PCR Next Gen NOT DETECTED NOT DETECTED Final    Comment: (NOTE) The GeneXpert MRSA Assay (FDA approved for NASAL specimens only), is one component of a comprehensive MRSA colonization surveillance program. It is not intended to diagnose MRSA infection nor to guide or monitor treatment for MRSA infections. Test performance is not FDA approved in patients less than 46 years old. Performed at [facility], [address] [place], [place] [address]   Culture, blood (Routine X 2) w Reflex to ID Panel     Status: None   Collection Time: 06/23/22  5:00 AM   Specimen: BLOOD  Result Value Ref Range Status   Specimen Description BLOOD BLOOD RIGHT ARM  Final   Special Requests   Final    BOTTLES DRAWN AEROBIC AND ANAEROBIC Blood Culture results may not be optimal due to an excessive volume of blood received in culture bottles   Culture   Final    NO GROWTH 5 DAYS Performed at [facility], [address] [place], [place] [address]    Report Status 06/28/2022 FINAL  Final   Culture, blood (Routine X 2) w Reflex to ID Panel     Status: None   Collection Time: 06/23/22  5:14 AM   Specimen: BLOOD  Result Value Ref Range Status   Specimen Description BLOOD BLOOD RIGHT HAND  Final   Special Requests   Final    BOTTLES DRAWN AEROBIC AND ANAEROBIC Blood Culture results may not be optimal due to an excessive volume of blood received in culture bottles   Culture   Final    NO GROWTH 5 DAYS Performed at [facility], [address] [place], [place] [address]    Report Status 06/28/2022 FINAL  Final   *Note: Due to a large number of results and/or encounters for the requested time period, some results have not been displayed. A complete set of results can be found in Results Review.    Labs: CBC: Recent Labs  Lab 07/02/22 1024 07/04/22 1014 07/06/22 2035 07/07/22 0435 07/08/22 0633  WBC 6.4 6.4 7.4 6.2 5.0  NEUTROABS 5.4  --   --   --   --   HGB 10.8* 10.8* 11.5* 10.5* 10.6*  HCT 36.6* 36.6* 38.8* 36.1* 37.0*  MCV 79.7* 79.6* 79.8* 81.5 82.4  PLT 138* 143* 179 169 150   Basic Metabolic Panel: Recent Labs  Lab 07/02/22 1024 07/04/22 1014 07/06/22 2035 07/07/22 0435 07/08/22 0633  NA 141 140 138 143 144  K 3.0* 3.1* 4.3 3.8 4.1  CL 100 97* 94* 97* 99  CO2 30 31 32 35* 36*  GLUCOSE 236* 113* 285* 149* 80  BUN 83* 78* 73* 69* 70*  CREATININE 3.12* 2.82* 2.64* 2.54* 2.46*  CALCIUM 8.4* 8.3* 8.9 8.8* 9.1   Liver Function Tests: Recent Labs  Lab 07/02/22 1024 07/04/22 1014 07/06/22 2035 07/08/22 0633  AST 22 33 49* 21  ALT 16 20 33 21  ALKPHOS 130* 136* 160* 119  BILITOT 0.7 0.6 0.7 0.9  PROT 6.8 6.9 7.7 7.1  ALBUMIN 3.1* 3.0* 3.5 3.2*   CBG: Recent Labs  Lab 07/07/22 0802 07/07/22 1236 07/07/22 1640 07/07/22 2141 07/08/22 0846  GLUCAP 152* 169* 202* 279* 95    Discharge time spent: greater than 30 minutes.  Signed: Delfino Lovett, MD [facility] 07/08/2022

## 2022-07-09 ENCOUNTER — Other Ambulatory Visit: Payer: Self-pay

## 2022-07-09 ENCOUNTER — Telehealth: Payer: Self-pay | Admitting: Internal Medicine

## 2022-07-09 MED FILL — Omeprazole Cap Delayed Release 20 MG: ORAL | 90 days supply | Qty: 90 | Fill #2 | Status: AC

## 2022-07-09 NOTE — Telephone Encounter (Signed)
Spoke to pt's wife per DPR. Advised her that he would need to be seen so proper documentation can be done for precert if needed. Also, he has had 2 CT Head w/o in the last 2 weeks. That made her feel a little better. She said they stopped the oxycodone early this morning to see that would help.

## 2022-07-09 NOTE — Telephone Encounter (Signed)
Pt's wife, Vikki Ports, called asking if Dr. Alphonsus Sias could order a CT scan of the pt's head? Vikki Ports stated the pt fell on last Sat, 4/13, & broke some ribs. Vikki Ports stated the pt stated in hosp for 2/3 days. Vikki Ports stated she believes the pt is beginning to hallucinate. Vikki Ports states the pt agrees that he does hallucinate at times. Vikki Ports states she doesn't think the hosp had a CT scan done. Vikki Ports states the pt has a appt with Cardiologist tomorrow, 4/17. Call back # [phone]

## 2022-07-10 ENCOUNTER — Telehealth: Payer: Self-pay

## 2022-07-10 ENCOUNTER — Encounter: Payer: Commercial Managed Care - PPO | Admitting: Cardiology

## 2022-07-10 ENCOUNTER — Other Ambulatory Visit: Payer: Self-pay

## 2022-07-10 ENCOUNTER — Encounter: Payer: Self-pay | Admitting: Infectious Diseases

## 2022-07-10 MED ORDER — SULFAMETHOXAZOLE-TRIMETHOPRIM 400-80 MG PO TABS
1.0000 | ORAL_TABLET | ORAL | 3 refills | Status: DC
  Filled 2022-07-10 – 2022-09-13 (×4): qty 36, 84d supply, fill #0
  Filled 2023-01-15: qty 36, 84d supply, fill #1

## 2022-07-10 NOTE — Transitions of Care (Post Inpatient/ED Visit) (Signed)
   07/10/2022  Name: [name]. MRN: [id number] DOB: April 25, 1960  Today's TOC FU Call Status: Today's TOC FU Call Status:: Successful TOC FU Call Competed TOC FU Call Complete Date: 07/10/22  Transition Care Management Follow-up Telephone Call Date of Discharge: 07/08/22 Discharge Facility: [facility]) Type of Discharge: Inpatient Admission Primary Inpatient Discharge Diagnosis:: fracture ribs How have you been since you were released from the hospital?: Same Any questions or concerns?: No  Items Reviewed: Did you receive and understand the discharge instructions provided?: Yes Medications obtained and verified?: Yes (Medications Reviewed) Any new allergies since your discharge?: No Dietary orders reviewed?: Yes Do you have support at home?: Yes People in Home: spouse  Home Care and Equipment/Supplies: Were Home Health Services Ordered?: NA Any new equipment or medical supplies ordered?: NA  Functional Questionnaire: Do you need assistance with bathing/showering or dressing?: Yes Do you need assistance with meal preparation?: Yes Do you need assistance with eating?: No Do you have difficulty maintaining continence: No Do you need assistance with getting out of bed/getting out of a chair/moving?: Yes Do you have difficulty managing or taking your medications?: No  Follow up appointments reviewed: PCP Follow-up appointment confirmed?: No (patient will call to schedule) MD Provider Line Number:[phone] Given: Yes Specialist Hospital Follow-up appointment confirmed?: NA Do you need transportation to your follow-up appointment?: No Do you understand care options if your condition(s) worsen?: Yes-patient verbalized understanding    SIGNATURE Karena Addison, LPN [facility] Nurse Health Advisor Direct Dial [phone]

## 2022-07-11 ENCOUNTER — Telehealth: Payer: Self-pay | Admitting: Internal Medicine

## 2022-07-11 NOTE — Telephone Encounter (Signed)
Lvm for patient tcb and schedule 

## 2022-07-11 NOTE — Telephone Encounter (Signed)
Patient called in requesting a virtual hospital f/u for his 4 broken ribs.He's unable to come into the office. Is it okay to scheduled this appointment as a virtual? Please advise

## 2022-07-12 ENCOUNTER — Other Ambulatory Visit: Payer: Self-pay

## 2022-07-12 ENCOUNTER — Other Ambulatory Visit (HOSPITAL_COMMUNITY): Payer: Self-pay | Admitting: Internal Medicine

## 2022-07-12 ENCOUNTER — Other Ambulatory Visit: Payer: Self-pay | Admitting: Internal Medicine

## 2022-07-12 DIAGNOSIS — D631 Anemia in chronic kidney disease: Secondary | ICD-10-CM | POA: Diagnosis not present

## 2022-07-12 DIAGNOSIS — D509 Iron deficiency anemia, unspecified: Secondary | ICD-10-CM | POA: Diagnosis not present

## 2022-07-12 DIAGNOSIS — N184 Chronic kidney disease, stage 4 (severe): Secondary | ICD-10-CM | POA: Diagnosis not present

## 2022-07-12 MED ORDER — LOSARTAN POTASSIUM 25 MG PO TABS
25.0000 mg | ORAL_TABLET | Freq: Every day | ORAL | 3 refills | Status: DC
  Filled 2022-07-12: qty 90, 90d supply, fill #0

## 2022-07-12 MED ORDER — POLYSACCHARIDE IRON COMPLEX 150 MG PO CAPS
150.0000 mg | ORAL_CAPSULE | Freq: Two times a day (BID) | ORAL | 0 refills | Status: DC
  Filled 2022-07-12: qty 180, 90d supply, fill #0

## 2022-07-15 ENCOUNTER — Other Ambulatory Visit (HOSPITAL_COMMUNITY): Payer: Self-pay | Admitting: Internal Medicine

## 2022-07-15 ENCOUNTER — Telehealth (INDEPENDENT_AMBULATORY_CARE_PROVIDER_SITE_OTHER): Payer: Commercial Managed Care - PPO | Admitting: Internal Medicine

## 2022-07-15 ENCOUNTER — Encounter: Payer: Self-pay | Admitting: Internal Medicine

## 2022-07-15 ENCOUNTER — Other Ambulatory Visit: Payer: Self-pay

## 2022-07-15 VITALS — Ht 70.0 in | Wt 254.0 lb

## 2022-07-15 DIAGNOSIS — S2241XS Multiple fractures of ribs, right side, sequela: Secondary | ICD-10-CM

## 2022-07-15 DIAGNOSIS — R7881 Bacteremia: Secondary | ICD-10-CM

## 2022-07-15 DIAGNOSIS — S2249XA Multiple fractures of ribs, unspecified side, initial encounter for closed fracture: Secondary | ICD-10-CM | POA: Insufficient documentation

## 2022-07-15 DIAGNOSIS — S27321D Contusion of lung, unilateral, subsequent encounter: Secondary | ICD-10-CM

## 2022-07-15 MED ORDER — TORSEMIDE 100 MG PO TABS
100.0000 mg | ORAL_TABLET | Freq: Two times a day (BID) | ORAL | 5 refills | Status: DC
  Filled 2022-07-15: qty 60, 30d supply, fill #0
  Filled 2022-08-13: qty 60, 30d supply, fill #1
  Filled 2022-09-13: qty 60, 30d supply, fill #2
  Filled 2022-10-21: qty 180, 90d supply, fill #3

## 2022-07-15 MED ORDER — HYDROCODONE-ACETAMINOPHEN 5-325 MG PO TABS
1.0000 | ORAL_TABLET | Freq: Four times a day (QID) | ORAL | 0 refills | Status: DC | PRN
  Filled 2022-07-15: qty 40, 5d supply, fill #0

## 2022-07-15 NOTE — Assessment & Plan Note (Signed)
Breathing okay

## 2022-07-15 NOTE — Assessment & Plan Note (Signed)
Home and no respiratory symptoms but significant pain and anxiety (not able to sleep) Discussed that I would rather the pain--than anxiety or give hypnotic Rx for hydrocodone--had some hallucinations with oxycodone

## 2022-07-15 NOTE — Progress Notes (Signed)
Subjective:    Patient ID: [name]., male    DOB: 13-Feb-1961, 62 y.o.   MRN: [id number]  HPI Video virtual visit for hospital follow up Identification done Reviewed limitations and billing and he gave consent Participants---patient in his home and I am in my office  Admitted for sepsis---UTI and Serratia bacteremia IV antibiotics--then continued at home with ertapenem daily (outpatient infusion) Seemed to feel pretty good then Finished the antibiotics  Then was mowing lawn on lawn tractor Hit foot on deck of mower and fell onto the ground (cement surface) Had to call for help--couldn't get up Neighbors helped him up---wife brought him to hospital  3 right rib fractures Pulmonary contusion found---but no pneumo- or hemothorax No surgical intervention needed---so sent home Was hurting "so bad" in chest, that he didn't even notice contusion of hip (and apparent hematoma) Now feels that pain--but able to walk around  Still with severe chest pain---not able to sleep (bed or recliner) Did get some oxycodone 5mg  that helped but has ran out  Is doing the incentive spirometer regularly--despite it hurting No SOB  Current Outpatient Medications on File Prior to Visit  Medication Sig Dispense Refill   albuterol (PROVENTIL) (2.5 MG/3ML) 0.083% nebulizer solution Take 3 mLs (2.5 mg total) by nebulization every 6 (six) hours as needed for wheezing or shortness of breath. 150 mL 0   albuterol (VENTOLIN HFA) 108 (90 Base) MCG/ACT inhaler Inhale 2 puffs into the lungs every 6 (six) hours as needed for wheezing or shortness of breath. 6.7 g 5   AMBULATORY NON FORMULARY MEDICATION Trimix (30/1/10)-(Pap/Phent/PGE)  Test Dose  1ml vial   Qty #3 Refills 0  [facility] [phone] Fax [phone] 3 mL 0   apixaban (ELIQUIS) 5 MG TABS tablet Take 1 tablet (5 mg total) by mouth 2 (two) times daily. Hold till 07/10/2022 and restart on 07/11/22 180 tablet 1   buPROPion  (WELLBUTRIN SR) 150 MG 12 hr tablet Take 1 tablet (150 mg total) by mouth 2 (two) times daily. 180 tablet 3   cholecalciferol (VITAMIN D3) 25 MCG (1000 UNIT) tablet Take 1,000 Units by mouth at bedtime.     colchicine 0.6 MG tablet Take 1 tablet (0.6 mg total) by mouth every other day. 45 tablet 1   Continuous Blood Gluc Sensor (FREESTYLE LIBRE 3 SENSOR) MISC Use one every 2 weeks. 6 each 3   cyanocobalamin 100 MCG tablet Take 100 mcg by mouth daily.     dapagliflozin propanediol (FARXIGA) 10 MG TABS tablet Take 1 tablet (10 mg total) by mouth daily before breakfast. 90 tablet 3   febuxostat (ULORIC) 40 MG tablet Take 1 tablet (40 mg total) by mouth daily. 90 tablet 5   fluticasone (FLONASE) 50 MCG/ACT nasal spray PLACE 2 SPRAYS INTO BOTH NOSTRILS DAILY. 16 g 11   gabapentin (NEURONTIN) 300 MG capsule Take 1 capsule (300 mg total) by mouth 2 (two) times daily. 180 capsule 3   hydrALAZINE (APRESOLINE) 25 MG tablet Take 1 tablet (25 mg total) by mouth every 8 (eight) hours. 90 tablet 1   hydrocortisone 2.5 % cream Apply topically 3 (three) times daily as needed. 30 g 3   insulin glargine-yfgn (SEMGLEE) 100 UNIT/ML Pen Inject 40 Units into the skin daily. (Patient taking differently: Inject 35 Units into the skin daily.) 45 mL 3   insulin lispro (HUMALOG KWIKPEN) 100 UNIT/ML KwikPen Inject 10-20 Units into the skin 3 (three) times daily. Per sliding scale  Insulin Pen Needle (UNIFINE PENTIPS) 31G X 5 MM MISC Use 4 times daily as directed 400 each 3   iron polysaccharides (FERREX 150) 150 MG capsule Take 1 capsule (150 mg total) by mouth 2 (two) times daily. 180 capsule 0   isosorbide mononitrate (IMDUR) 30 MG 24 hr tablet Take 1 tablet (30 mg total) by mouth daily. 30 tablet 2   lidocaine (LIDODERM) 5 % Place 1 patch onto the skin daily.PLACE 1 PATCH ONTO THE SKIN FOR 12 (TWELVE) HOURS. REMOVE & DISCARD PATCH WITHIN 12 HOURS OR AS DIRECTED BY MD (Patient taking differently: Place 1 patch onto the  skin daily as needed.) 60 patch 11   lidocaine (XYLOCAINE) 5 % ointment Apply 1 Application topically 3 (three) times daily as needed. Apply pea size amount to area as needed for pain. 50 g 0   losartan (COZAAR) 25 MG tablet Take 1 tablet (25 mg total) by mouth daily. 90 tablet 3   metolazone (ZAROXOLYN) 2.5 MG tablet Take 1 tablet by mouth 3 times weekly. 15 tablet 3   montelukast (SINGULAIR) 10 MG tablet TAKE 1 TABLET BY MOUTH AT BEDTIME. (Patient taking differently: Take 10 mg by mouth at bedtime.) 90 tablet 3   Multiple Vitamin (MULTIVITAMIN WITH MINERALS) TABS tablet Take 1 tablet by mouth daily.     mycophenolate (MYFORTIC) 180 MG EC tablet Take 2 tablets (360 mg total) by mouth 2 (two) times daily. 360 tablet 3   omeprazole (PRILOSEC) 20 MG capsule TAKE 1 CAPSULE BY MOUTH DAILY. (Patient taking differently: Take 20 mg by mouth at bedtime.) 90 capsule 3   predniSONE (DELTASONE) 5 MG tablet Take 1 tablet (5 mg total) by mouth daily. 90 tablet 3   rosuvastatin (CRESTOR) 10 MG tablet Take 1 tablet (10 mg total) by mouth daily. (Patient taking differently: Take 10 mg by mouth at bedtime.) 90 tablet 3   sulfamethoxazole-trimethoprim (BACTRIM) 400-80 MG tablet Take 1 tablet by mouth every Monday, Wednesday, and Friday 36 tablet 3   tacrolimus (PROGRAF) 1 MG capsule Take 2 capsules (2 mg total) by mouth 2 times daily. 120 capsule 11   tamsulosin (FLOMAX) 0.4 MG CAPS capsule Take 1 capsule (0.4 mg total) by mouth daily. 90 capsule 3   Testosterone 10 MG/ACT (2%) GEL Apply 1 Pump topically daily to inner thighs - 2 pumps total. 60 g 0   torsemide (DEMADEX) 20 MG tablet Take 5 tablets (100 mg total) by mouth daily. 300 tablet 3   zinc sulfate 220 (50 Zn) MG capsule Take 220 mg by mouth daily.     No current facility-administered medications on file prior to visit.    Allergies  Allergen Reactions   Contrast Media [Iodinated Contrast Media] Other (See Comments)    Kidney transplant   Iodine Other  (See Comments)    Other reaction(s): Other (See Comments) Kidney transplant   Ibuprofen Other (See Comments)    Due to kidney transplant Due to kidney transplant Due to kidney transplant    Past Medical History:  Diagnosis Date   Anal condyloma    Anemia    Aortic atherosclerosis    Aortic stenosis    a.) TTE 10/2021: Mod AS. AVA 1.06cm^2 (VTI). Mean grad ; b.) TTE 04/02/2022: no AV stenosis   Asthma, persistent controlled 02/25/2013   Attention or concentration deficit 04/26/2021   BPH with obstruction/lower urinary tract symptoms 09/25/2014   CAD (coronary artery disease) 2005   a.) LHC 1996 -> HG stenosis mLAD -> PTCA/PCI  with stent x 1 (unk type) -> complicated by CFA pseudoanurysm (required surg);  b.) LHC 08/10/2002  -> 95% LAD, 70% ISR LAD, 80% pOM, 70% mRCA --> CVTS consult; c.) 4v CABG 08/13/2002; c.) 03/2018 MV: Small, fixed inferoapical and apical sep defect. No isc. EF 47% (60-65% by 05/2018 TTE); d.) 02/2021 MV: small Apical inf fixed defect. No isc -> low risk.   Cardiomegaly    Cellulitis and abscess of left leg 07/22/2020   Chronic atrial fibrillation    a.) CHA2DS2VASc = 4 (CHF, HTN, vascular disease history, T2DM);  b.) rate/rhythm maintained without pharmacological intervention; chronically anticoagulated with apixaban   Chronic combined systolic and diastolic CHF (congestive heart failure)    a.) TTE 7/18 : EF 45-50%; b.) TTE 05/2018 : EF 60-65%, RVSP 74.8; c.) TTE 12/2018: EF 50-55%. Sev dil LA; d.) TTE 04/2019: EF 45-50%; e.) TTE 07/2021: EF 40-45%, G3DD; f.) TTE 10/2021: EF 40-45%, mild conc LVH, septal-lat dyssynchrony (LBBB), mildly red RVSF, mild-mod dil LA, mildly dil RA, mild-mod MS, mod AS; g.) TTE 04/02/2022: EF 40-45%, glob HK, LVH, red RVSF, RVE, sev LAE, mild-mod RAE, mild MR   Chronic venous stasis dermatitis of both lower extremities    Cytomegaloviral disease 2017   Diverticulosis of colon 04/22/2013   Elevated RIGHT hemidiaphragm    Erectile  dysfunction    a.) on topical TRT + Trimix (alprostadil/papaverine/phentolamine) injections   ESRD (end stage renal disease)    a.) dialysis dependent 2004-2012; b.) s/p cadaveric RIGHT renal transplant 09/13/2010   Essential hypertension 12/17/2010   GERD (gastroesophageal reflux disease)    Gout    History of 2019 novel coronavirus disease (COVID-19) 06/30/2019   History of bilateral cataract extraction 10/2017   History of renal transplant 09/13/2010   a.) s/p cadaveric donor transplant 09/13/2010   Hx of bilateral cataract extraction 10/2017   Hyperlipidemia    Hypogonadism in male 09/25/2014   Ischemic cardiomyopathy    Left cephalic vein thrombosis 06/2019   Long term current use of anticoagulant    a.) apixaban   Long term current use of immunosuppressive drug    a.) mycophenolate + prednisone + tacrolimus   Major depressive disorder 10/04/2013   Mitral stenosis 11/07/2021   a.) TTE 11/07/2021: severe MAC, mild-mod MS (MPG 6 mmHg); b.) TTE 04/02/2022: no MV stenosis   Murmur    Obesity hypoventilation syndrome 03/31/2012   OSA treated with BiPAP 09/29/2012   PAH (pulmonary artery hypertension)    a. 04/2019 RHC: RA 19, RV 80/20, PA 78/31 (51), PCWP 25, CO/CI 7.69/3.26. PVR 3.25 --> Sev PAH, likely primarily PV HTN; b.) RHC 04/04/2022: mRA 13, mPA 40, mPCWP 20, PA sat 59, AO sat 92, CO 7.63, CI 3.26, PVR 2.6 --> mod portal venous HTN   Pancreatitis    S/P CABG x 4 08/13/2002   a.) LIMA-LAD, LRA-OM, SVG-D1, SVG-dRCA   S/P gastric bypass    Synovitis of finger 06/11/2019   Trigger finger, unspecified little finger 12/23/2018   Type 2 diabetes mellitus with hyperglycemia, with long-term current use of insulin 09/09/2014   a.) has FreeStyle Libre CGM   Type 2 MI (myocardial infarction)    Ulcer 05/2016   Left shin   Wears glasses     Past Surgical History:  Procedure Laterality Date   CARDIAC CATHETERIZATION N/A 08/10/2002   CATARACT EXTRACTION W/PHACO Right 10/29/2017    Procedure: CATARACT EXTRACTION PHACO AND INTRAOCULAR LENS PLACEMENT (IOC)  RIGHT DIABETIC;  Surgeon: Lockie Mola, MD;  Location: [facility];  Service: Ophthalmology;  Laterality: Right   CATARACT EXTRACTION W/PHACO Left 11/18/2017   Procedure: CATARACT EXTRACTION PHACO AND INTRAOCULAR LENS PLACEMENT (IOC) LEFT IVA/TOPICAL;  Surgeon: Lockie Mola, MD;  Location: [facility];  Service: Ophthalmology;  Laterality: Left   COLONOSCOPY WITH PROPOFOL N/A 04/22/2013   Procedure: COLONOSCOPY WITH PROPOFOL;  Surgeon: Rachael Fee, MD;  Location: [facility];  Service: Endoscopy;  Laterality: N/A;   CORONARY ANGIOPLASTY WITH STENT PLACEMENT N/A 1996   CORONARY ARTERY BYPASS GRAFT N/A 08/13/2002   Procedure: 4v CORONARY ARTERY BYPASS GRAFTING; Location: [facility]; Surgeon: Katy Apo, MD   CYSTOSCOPY WITH INSERTION OF UROLIFT N/A 12/25/2021   Procedure: CYSTOSCOPY WITH INSERTION OF UROLIFT;  Surgeon: Riki Altes, MD;  Location: [facility];  Service: Urology;  Laterality: N/A;   DG ANGIO AV SHUNT*L*     right and left upper arms   FASCIOTOMY  03/03/2012   Procedure: FASCIOTOMY;  Surgeon: Nicki Reaper, MD;  Location: [facility];  Service: Orthopedics;  Laterality: Right;  FASCIOTOMY RIGHT SMALL FINGER   FASCIOTOMY Left 08/17/2013   Procedure: FASCIOTOMY LEFT RING;  Surgeon: Nicki Reaper, MD;  Location: [facility];  Service: Orthopedics;  Laterality: Left;   INCISION AND DRAINAGE ABSCESS Left 10/15/2015   Procedure: INCISION AND DRAINAGE ABSCESS;  Surgeon: Leafy Ro, MD;  Location: [facility];  Service: General;  Laterality: Left;   KIDNEY TRANSPLANT  09/13/2010   cadaver--at [name] HEART CATH N/A 11/15/2016   Procedure: RIGHT HEART CATH;  Surgeon: Iran Ouch, MD;  Location: [facility];  Service: Cardiovascular;  Laterality: N/A;   RIGHT HEART CATH N/A 05/03/2019   Procedure: RIGHT HEART CATH;  Surgeon:  Laurey Morale, MD;  Location: [facility] INVASIVE CV LAB;  Service: Cardiovascular;  Laterality: N/A;   RIGHT HEART CATH N/A 04/04/2022   Procedure: RIGHT HEART CATH;  Surgeon: Laurey Morale, MD;  Location: [facility] INVASIVE CV LAB;  Service: Cardiovascular;  Laterality: N/A;   ROUX-EN-Y GASTRIC BYPASS N/A 2015   TYMPANIC MEMBRANE REPAIR Left 03/2010   VASECTOMY     WART FULGURATION N/A 05/31/2022   Procedure: FULGURATION ANAL WART;  Surgeon: Sung Amabile, DO;  Location: [facility];  Service: General;  Laterality: N/A;    Family History  Problem Relation Age of Onset   Heart disease Father    Kidney failure Father    Kidney disease Father    Diabetes Maternal Grandmother    Breast cancer Maternal Grandmother    Valvular heart disease Mother    Liver cancer Paternal Uncle    Liver cancer Paternal Grandmother    Prostate cancer Neg Hx     Social History   Socioeconomic History   Marital status: Married    Spouse name: Not on file   Number of children: 2   Years of education: 14   Highest education level: Associate degree: academic program  Occupational History   Occupation: Presenter, broadcasting: unemployed    Comment: disabled due to kidney failure  Tobacco Use   Smoking status: Former    Types: Cigars    Quit date: 09/02/1994    Years since quitting: 27.8    Passive exposure: Never   Smokeless tobacco: Never   Tobacco comments:    Occasional, rare cigars  Vaping Use   Vaping Use: Never used  Substance and Sexual Activity   Alcohol use: Not Currently  Comment: occ   Drug use: No   Sexual activity: Not on file  Other Topics Concern   Not on file  Social History Narrative   Has living will   Wife is health care POA   Would accept resuscitation attempts   No tube feedings if cognitively unaware   Social Determinants of Health   Financial Resource Strain: Not on file  Food Insecurity: No Food Insecurity (07/07/2022)   Hunger Vital Sign    Worried  About Running Out of Food in the Last Year: Never true    Ran Out of Food in the Last Year: Never true  Transportation Needs: No Transportation Needs (07/07/2022)   PRAPARE - Administrator, Civil Service (Medical): No    Lack of Transportation (Non-Medical): No  Physical Activity: Not on file  Stress: Not on file  Social Connections: Not on file  Intimate Partner Violence: Not At Risk (07/07/2022)   Humiliation, Afraid, Rape, and Kick questionnaire    Fear of Current or Ex-Partner: No    Emotionally Abused: No    Physically Abused: No    Sexually Abused: No     Review of Systems No abdominal pain No N/V Eating but not great--pain is a limitation    Objective:   Physical Exam Constitutional:      Appearance: Normal appearance.  Pulmonary:     Effort: Pulmonary effort is normal. No respiratory distress.  Neurological:     Mental Status: He is alert.            Assessment & Plan:

## 2022-07-15 NOTE — Assessment & Plan Note (Signed)
Symptoms appear resolved No further action Does have follow up with transplant team on May 5th

## 2022-07-15 NOTE — Telephone Encounter (Signed)
Called to verify with pt if he wanted the 100 mg or the 20s that were on file. He wants the 100 mg twice a day. New rx sent.

## 2022-07-18 ENCOUNTER — Other Ambulatory Visit: Payer: Self-pay

## 2022-07-18 NOTE — Transitions of Care (Post Inpatient/ED Visit) (Signed)
   07/18/2022  Name: [name]. MRN: [id number] DOB: 1960-10-24  Today's TOC FU Call Status: Today's TOC FU Call Status:: Unsuccessful Call (3rd Attempt) Unsuccessful Call (1st Attempt) Date: 07/01/22 Unsuccessful Call (2nd Attempt) Date: 07/08/22 Unsuccessful Call (3rd Attempt) Date: 07/18/22  Attempted to reach the patient regarding the most recent Inpatient/ED visit.  Follow Up Plan: No further outreach attempts will be made at this time. We have been unable to contact the patient.  Signature Agnes Lawrence, CMA (AAMA)  [facility] [phone]

## 2022-07-19 ENCOUNTER — Ambulatory Visit (INDEPENDENT_AMBULATORY_CARE_PROVIDER_SITE_OTHER): Payer: Commercial Managed Care - PPO

## 2022-07-19 ENCOUNTER — Encounter: Payer: Self-pay | Admitting: Family Medicine

## 2022-07-19 ENCOUNTER — Ambulatory Visit
Admission: RE | Admit: 2022-07-19 | Discharge: 2022-07-19 | Disposition: A | Discharge status: Home / Self Care | Payer: Commercial Managed Care - PPO | Source: Ambulatory Visit | Attending: Family Medicine | Admitting: Family Medicine

## 2022-07-19 ENCOUNTER — Ambulatory Visit (INDEPENDENT_AMBULATORY_CARE_PROVIDER_SITE_OTHER): Payer: Commercial Managed Care - PPO | Admitting: Family Medicine

## 2022-07-19 ENCOUNTER — Telehealth: Payer: Self-pay | Admitting: Internal Medicine

## 2022-07-19 VITALS — BP 122/78 | HR 71 | Temp 97.8°F | Ht 70.0 in | Wt 267.2 lb

## 2022-07-19 DIAGNOSIS — M79661 Pain in right lower leg: Secondary | ICD-10-CM

## 2022-07-19 DIAGNOSIS — M25551 Pain in right hip: Secondary | ICD-10-CM

## 2022-07-19 DIAGNOSIS — M7989 Other specified soft tissue disorders: Secondary | ICD-10-CM | POA: Insufficient documentation

## 2022-07-19 DIAGNOSIS — R600 Localized edema: Secondary | ICD-10-CM | POA: Diagnosis not present

## 2022-07-19 DIAGNOSIS — N184 Chronic kidney disease, stage 4 (severe): Secondary | ICD-10-CM | POA: Diagnosis not present

## 2022-07-19 DIAGNOSIS — D509 Iron deficiency anemia, unspecified: Secondary | ICD-10-CM | POA: Diagnosis not present

## 2022-07-19 DIAGNOSIS — M79651 Pain in right thigh: Secondary | ICD-10-CM | POA: Diagnosis not present

## 2022-07-19 DIAGNOSIS — D631 Anemia in chronic kidney disease: Secondary | ICD-10-CM | POA: Diagnosis not present

## 2022-07-19 NOTE — Telephone Encounter (Signed)
Access nurse note.  Pt has appointment with Dr. Birdie Sons today @ 3:15pm

## 2022-07-19 NOTE — Telephone Encounter (Signed)
I am glad he could get in today

## 2022-07-19 NOTE — Assessment & Plan Note (Signed)
Patient with pain and swelling in his right leg.  We will get an ultrasound today to evaluate for an underlying clot though would seem unlikely given that he is on chronic anticoagulation.  Will also get x-rays of his right hip and right femur.  Discussed the most likely cause for his discomfort is the bruising and the hematoma.  We will contact him with his results.

## 2022-07-19 NOTE — Progress Notes (Signed)
Marikay Alar, MD Phone: [phone]  [name] Obst. is a 62 y.o. male who presents today for same day visit.   Right leg pain and swelling: Patient notes his right hip and buttock and leg have been hurting since he had a fall a couple of weeks ago.  He was initially diagnosed with rib fractures and was more focused on the pain from that then the pain in his leg.  He notes his right leg has been swollen.  He notes there is a bruise on his right buttock.  He is on Eliquis and has not missed any doses.  Social History   Tobacco Use  Smoking Status Former   Types: Cigars   Quit date: 09/02/1994   Years since quitting: 27.8   Passive exposure: Never  Smokeless Tobacco Never  Tobacco Comments   Occasional, rare cigars    Current Outpatient Medications on File Prior to Visit  Medication Sig Dispense Refill   albuterol (PROVENTIL) (2.5 MG/3ML) 0.083% nebulizer solution Take 3 mLs (2.5 mg total) by nebulization every 6 (six) hours as needed for wheezing or shortness of breath. 150 mL 0   albuterol (VENTOLIN HFA) 108 (90 Base) MCG/ACT inhaler Inhale 2 puffs into the lungs every 6 (six) hours as needed for wheezing or shortness of breath. 6.7 g 5   AMBULATORY NON FORMULARY MEDICATION Trimix (30/1/10)-(Pap/Phent/PGE)  Test Dose  1ml vial   Qty #3 Refills 0  [facility] [phone] Fax [phone] 3 mL 0   apixaban (ELIQUIS) 5 MG TABS tablet Take 1 tablet (5 mg total) by mouth 2 (two) times daily. Hold till 07/10/2022 and restart on 07/11/22 180 tablet 1   buPROPion (WELLBUTRIN SR) 150 MG 12 hr tablet Take 1 tablet (150 mg total) by mouth 2 (two) times daily. 180 tablet 3   cholecalciferol (VITAMIN D3) 25 MCG (1000 UNIT) tablet Take 1,000 Units by mouth at bedtime.     colchicine 0.6 MG tablet Take 1 tablet (0.6 mg total) by mouth every other day. 45 tablet 1   Continuous Blood Gluc Sensor (FREESTYLE LIBRE 3 SENSOR) MISC Use one every 2 weeks. 6 each 3   cyanocobalamin  100 MCG tablet Take 100 mcg by mouth daily.     dapagliflozin propanediol (FARXIGA) 10 MG TABS tablet Take 1 tablet (10 mg total) by mouth daily before breakfast. 90 tablet 3   febuxostat (ULORIC) 40 MG tablet Take 1 tablet (40 mg total) by mouth daily. 90 tablet 5   fluticasone (FLONASE) 50 MCG/ACT nasal spray PLACE 2 SPRAYS INTO BOTH NOSTRILS DAILY. 16 g 11   gabapentin (NEURONTIN) 300 MG capsule Take 1 capsule (300 mg total) by mouth 2 (two) times daily. 180 capsule 3   hydrALAZINE (APRESOLINE) 25 MG tablet Take 1 tablet (25 mg total) by mouth every 8 (eight) hours. 90 tablet 1   HYDROcodone-acetaminophen (NORCO/VICODIN) 5-325 MG tablet Take 1-2 tablets by mouth every 6 (six) hours as needed for moderate pain. 40 tablet 0   hydrocortisone 2.5 % cream Apply topically 3 (three) times daily as needed. 30 g 3   insulin glargine-yfgn (SEMGLEE) 100 UNIT/ML Pen Inject 40 Units into the skin daily. (Patient taking differently: Inject 35 Units into the skin daily.) 45 mL 3   insulin lispro (HUMALOG KWIKPEN) 100 UNIT/ML KwikPen Inject 10-20 Units into the skin 3 (three) times daily. Per sliding scale     Insulin Pen Needle (UNIFINE PENTIPS) 31G X 5 MM MISC Use 4 times daily as directed  400 each 3   iron polysaccharides (FERREX 150) 150 MG capsule Take 1 capsule (150 mg total) by mouth 2 (two) times daily. 180 capsule 0   isosorbide mononitrate (IMDUR) 30 MG 24 hr tablet Take 1 tablet (30 mg total) by mouth daily. 30 tablet 2   lidocaine (LIDODERM) 5 % Place 1 patch onto the skin daily.PLACE 1 PATCH ONTO THE SKIN FOR 12 (TWELVE) HOURS. REMOVE & DISCARD PATCH WITHIN 12 HOURS OR AS DIRECTED BY MD (Patient taking differently: Place 1 patch onto the skin daily as needed.) 60 patch 11   lidocaine (XYLOCAINE) 5 % ointment Apply 1 Application topically 3 (three) times daily as needed. Apply pea size amount to area as needed for pain. 50 g 0   losartan (COZAAR) 25 MG tablet Take 1 tablet (25 mg total) by mouth daily.  90 tablet 3   metolazone (ZAROXOLYN) 2.5 MG tablet Take 1 tablet by mouth 3 times weekly. 15 tablet 3   montelukast (SINGULAIR) 10 MG tablet TAKE 1 TABLET BY MOUTH AT BEDTIME. (Patient taking differently: Take 10 mg by mouth at bedtime.) 90 tablet 3   Multiple Vitamin (MULTIVITAMIN WITH MINERALS) TABS tablet Take 1 tablet by mouth daily.     mycophenolate (MYFORTIC) 180 MG EC tablet Take 2 tablets (360 mg total) by mouth 2 (two) times daily. 360 tablet 3   omeprazole (PRILOSEC) 20 MG capsule TAKE 1 CAPSULE BY MOUTH DAILY. (Patient taking differently: Take 20 mg by mouth at bedtime.) 90 capsule 3   predniSONE (DELTASONE) 5 MG tablet Take 1 tablet (5 mg total) by mouth daily. 90 tablet 3   rosuvastatin (CRESTOR) 10 MG tablet Take 1 tablet (10 mg total) by mouth daily. (Patient taking differently: Take 10 mg by mouth at bedtime.) 90 tablet 3   sulfamethoxazole-trimethoprim (BACTRIM) 400-80 MG tablet Take 1 tablet by mouth every Monday, Wednesday, and Friday 36 tablet 3   tacrolimus (PROGRAF) 1 MG capsule Take 2 capsules (2 mg total) by mouth 2 times daily. 120 capsule 11   tamsulosin (FLOMAX) 0.4 MG CAPS capsule Take 1 capsule (0.4 mg total) by mouth daily. 90 capsule 3   Testosterone 10 MG/ACT (2%) GEL Apply 1 Pump topically daily to inner thighs - 2 pumps total. 60 g 0   torsemide (DEMADEX) 100 MG tablet Take 1 tablet (100 mg total) by mouth 2 (two) times daily. 60 tablet 5   zinc sulfate 220 (50 Zn) MG capsule Take 220 mg by mouth daily.     No current facility-administered medications on file prior to visit.     ROS see history of present illness  Objective  Physical Exam Vitals:   07/19/22 1528  BP: 122/78  Pulse: 71  Temp: 97.8 F (36.6 C)  SpO2: 94%    BP Readings from Last 3 Encounters:  07/19/22 122/78  07/08/22 135/78  06/28/22 (!) 157/81   Wt Readings from Last 3 Encounters:  07/19/22 267 lb 3.2 oz (121.2 kg)  07/15/22 254 lb (115.2 kg)  07/06/22 253 lb (114.8 kg)     Physical Exam Constitutional:      General: He is not in acute distress.    Appearance: He is not diaphoretic.  Cardiovascular:     Rate and Rhythm: Normal rate and regular rhythm.     Heart sounds: Normal heart sounds.  Pulmonary:     Effort: Pulmonary effort is normal.     Breath sounds: Normal breath sounds.  Musculoskeletal:     Comments: Slightly  restricted external range of motion both hips, good internal range of motion both hips, there is a bruise over his right buttock and a hematoma over his right lateral posterior hip, right leg is significantly more swollen than the left leg  Skin:    General: Skin is warm and dry.  Neurological:     Mental Status: He is alert.      Assessment/Plan: Please see individual problem list.  Pain and swelling of right lower leg Assessment & Plan: Patient with pain and swelling in his right leg.  We will get an ultrasound today to evaluate for an underlying clot though would seem unlikely given that he is on chronic anticoagulation.  Will also get x-rays of his right hip and right femur.  Discussed the most likely cause for his discomfort is the bruising and the hematoma.  We will contact him with his results.  Orders: -     US Venous Img Lower Unilateral Right (DVT); Future -     DG FEMUR, MIN 2 VIEWS RIGHT; Future  Right hip pain -     DG HIP UNILAT W OR W/O PELVIS 2-3 VIEWS RIGHT; Future    Return if symptoms worsen or fail to improve.   Marikay Alar, MD [facility]

## 2022-07-19 NOTE — Telephone Encounter (Signed)
Please follow-up on the access nurse outcome for this patient. Thanks.

## 2022-07-19 NOTE — Telephone Encounter (Signed)
Pt called requesting a appt for a possible blood clot in his right leg. Scheduled pt with Dr. Doran Durand @ [facility] for today, 4/26 @ 3:15pm. Transferred pt to access nurse. Call back # [phone]

## 2022-07-22 DIAGNOSIS — G4733 Obstructive sleep apnea (adult) (pediatric): Secondary | ICD-10-CM | POA: Diagnosis not present

## 2022-07-22 DIAGNOSIS — E662 Morbid (severe) obesity with alveolar hypoventilation: Secondary | ICD-10-CM | POA: Diagnosis not present

## 2022-07-23 ENCOUNTER — Telehealth: Payer: Self-pay | Admitting: Family Medicine

## 2022-07-23 DIAGNOSIS — T148XXA Other injury of unspecified body region, initial encounter: Secondary | ICD-10-CM

## 2022-07-23 DIAGNOSIS — M25551 Pain in right hip: Secondary | ICD-10-CM

## 2022-07-23 NOTE — Telephone Encounter (Signed)
Please let the patient know that I placed a referral to sports medicine in [place] for the possible morel lavallee lesion near his right hip. They should call to schedule an appointment.

## 2022-07-23 NOTE — Telephone Encounter (Signed)
Spoke to Patient to let him know that Dr. Birdie Sons referred him to sports medicine in [facility] for his right hip lesion.

## 2022-07-23 NOTE — Telephone Encounter (Signed)
-----   Message from Judi Saa, DO sent at 07/23/2022  7:10 AM EDT ----- Regarding: RE: possible morel lavallee lesion Yea we could definitely see him and see what it is. It is interesting with the read and the adipose tissue they talk about. But maybe because it is so early in the development it could have some change.  Would be happy to see him if we can help  [name]  ----- Message ----- From: Glori Luis, MD Sent: 07/22/2022   2:02 PM EDT To: Judi Saa, DO Subject: possible morel lavallee lesion                 Merryl Hacker,   I hope you are doing well. I saw this patient last week for leg pain and swelling after a fall. He was evaluated in the hospital for the fall and found to have rib fractures, but noted the leg and hip pain after getting out of the hospital. On his [name] they noted a likely hematoma near his right hip though could not rule out a morel lavallee lesion. I believe you saw one of my patients for a morel lavallee lesion in the past and drained it several times for him. I wanted to see if you thought it would be a good idea for this patient to come in and see you to see if this could be this type of lesion.   "4.9 x 1.5 x 3.8 cm subcutaneous collection with some interspersed adipose tissue in the subcutaneous tissues along the right hip region adjacent to the superficial fascia margin, probably a hematoma. Norma Fredrickson lesion not excluded"  Thanks for any input you have on this.   Minerva Areola

## 2022-07-24 DIAGNOSIS — Z79621 Long term (current) use of calcineurin inhibitor: Secondary | ICD-10-CM | POA: Diagnosis not present

## 2022-07-24 DIAGNOSIS — I10 Essential (primary) hypertension: Secondary | ICD-10-CM | POA: Diagnosis not present

## 2022-07-24 DIAGNOSIS — Z79899 Other long term (current) drug therapy: Secondary | ICD-10-CM | POA: Diagnosis not present

## 2022-07-24 DIAGNOSIS — E876 Hypokalemia: Secondary | ICD-10-CM | POA: Diagnosis not present

## 2022-07-24 DIAGNOSIS — Z5181 Encounter for therapeutic drug level monitoring: Secondary | ICD-10-CM | POA: Diagnosis not present

## 2022-07-24 DIAGNOSIS — D631 Anemia in chronic kidney disease: Secondary | ICD-10-CM | POA: Diagnosis not present

## 2022-07-24 DIAGNOSIS — Z4822 Encounter for aftercare following kidney transplant: Secondary | ICD-10-CM | POA: Diagnosis not present

## 2022-07-24 DIAGNOSIS — D509 Iron deficiency anemia, unspecified: Secondary | ICD-10-CM | POA: Diagnosis not present

## 2022-07-24 DIAGNOSIS — Z940 Kidney transplant status: Secondary | ICD-10-CM | POA: Diagnosis not present

## 2022-07-24 DIAGNOSIS — N184 Chronic kidney disease, stage 4 (severe): Secondary | ICD-10-CM | POA: Diagnosis not present

## 2022-07-24 NOTE — Progress Notes (Deleted)
Cardiology Office Note:    Date:  07/24/2022   ID:  [name]., DOB [birth year], MRN [id number]  PCP:  [name], MD  [facility] Cardiologist:  Lorine Bears, MD  [facility] Electrophysiologist:  None   Referring MD: [name], MD   Chief Complaint: Hospital follow-up  History of Present Illness:    [name]. is a 62 y.o. male with a hx of CAD status post CABG in 2005, permanent A-fib on Eliquis, ESRD s/p renal transplant 2012 with subsequent CKD stage IIIb, HFmrEF, pulmonary hypertension, hypertension, hyperlipidemia, and obesity s/p gastric bypass who is being seen for hospital follow-up.  Patient underwent stress test in 2020 showing no significant ischemia, small region of fixed perfusion defect in the inferoapical and apical septal region possibly attenuation artifact, EF 47, liver study.  He had an episode of intermittent dizziness with heart rate in the 40s.  Heart monitor showed no significant bradycardic events except during sleep time NSR was in the 30s.  Patient had ischemic evaluation via stress testing 2022 that was low risk with no evidence of ischemia.  Patient has been followed by the advanced heart failure service.  He was admitted to the hospital in January 2024 with acute on chronic CHF.  Echo showed EF 40 to 45%, moderate reduced RV SF.  Right heart cath showed elevated filling pressures, moderate pulmonary venous hypertension and preserved cardiac output.  Tadalafil was discontinued with primary pulmonary hypertension group 2 and 3.  He was diuresed with IV Lasix and metolazone.  He is no longer on ACE or spiral secondary to renal dysfunction.  No beta-blocker secondary to underlying bradycardia.  Patient was admitted again in March 2024 was found to have pyelonephritis in the context of transplanted kidney.  Antibiotics for 65, which was around his baseline.  High-sensitivity troponin 97.  Torsemide and Marcelline Deist were  held.  Patient was resuscitated with IV fluids.  Patient later required IV diuresis with metolazone.  He was seen by advanced heart failure team.  Diuresis was complicated by AKI.  He was ultimately restarted on torsemide.  Today,  Past Medical History:  Diagnosis Date   Anal condyloma    Anemia    Aortic atherosclerosis (HCC)    Aortic stenosis    a.) TTE 10/2021: Mod AS. AVA 1.06cm^2 (VTI). Mean grad ; b.) TTE 04/02/2022: no AV stenosis   Asthma, persistent controlled 02/25/2013   Attention or concentration deficit 04/26/2021   BPH with obstruction/lower urinary tract symptoms 09/25/2014   CAD (coronary artery disease) 2005   a.) LHC 1996 -> HG stenosis mLAD -> PTCA/PCI with stent x 1 (unk type) -> complicated by CFA pseudoanurysm (required surg);  b.) LHC 08/10/2002  -> 95% LAD, 70% ISR LAD, 80% pOM, 70% mRCA --> CVTS consult; c.) 4v CABG 08/13/2002; c.) 03/2018 MV: Small, fixed inferoapical and apical sep defect. No isc. EF 47% (60-65% by 05/2018 TTE); d.) 02/2021 MV: small Apical inf fixed defect. No isc -> low risk.   Cardiomegaly    Cellulitis and abscess of left leg 07/22/2020   Chronic atrial fibrillation    a.) CHA2DS2VASc = 4 (CHF, HTN, vascular disease history, T2DM);  b.) rate/rhythm maintained without pharmacological intervention; chronically anticoagulated with apixaban   Chronic combined systolic and diastolic CHF (congestive heart failure)    a.) TTE 7/18 : EF 45-50%; b.) TTE 05/2018 : EF 60-65%, RVSP 74.8; c.) TTE 12/2018: EF 50-55%. Sev dil LA; d.) TTE 04/2019: EF  45-50%; e.) TTE 07/2021: EF 40-45%, G3DD; f.) TTE 10/2021: EF 40-45%, mild conc LVH, septal-lat dyssynchrony (LBBB), mildly red RVSF, mild-mod dil LA, mildly dil RA, mild-mod MS, mod AS; g.) TTE 04/02/2022: EF 40-45%, glob HK, LVH, red RVSF, RVE, sev LAE, mild-mod RAE, mild MR   Chronic venous stasis dermatitis of both lower extremities    Cytomegaloviral disease 2017   Diverticulosis of colon 04/22/2013    Elevated RIGHT hemidiaphragm    Erectile dysfunction    a.) on topical TRT + Trimix (alprostadil/papaverine/phentolamine) injections   ESRD (end stage renal disease) (HCC)    a.) dialysis dependent 2004-2012; b.) s/p cadaveric RIGHT renal transplant 09/13/2010   Essential hypertension 12/17/2010   GERD (gastroesophageal reflux disease)    Gout    History of 2019 novel coronavirus disease (COVID-19) 06/30/2019   History of bilateral cataract extraction 10/2017   History of renal transplant 09/13/2010   a.) s/p cadaveric donor transplant 09/13/2010   Hx of bilateral cataract extraction 10/2017   Hyperlipidemia    Hypogonadism in male 09/25/2014   Ischemic cardiomyopathy    Left cephalic vein thrombosis 06/2019   Long term current use of anticoagulant    a.) apixaban   Long term current use of immunosuppressive drug    a.) mycophenolate + prednisone + tacrolimus   Major depressive disorder 10/04/2013   Mitral stenosis 11/07/2021   a.) TTE 11/07/2021: severe MAC, mild-mod MS (MPG 6 mmHg); b.) TTE 04/02/2022: no MV stenosis   Murmur    Obesity hypoventilation syndrome 03/31/2012   OSA treated with BiPAP 09/29/2012   PAH (pulmonary artery hypertension)    a. 04/2019 RHC: RA 19, RV 80/20, PA 78/31 (51), PCWP 25, CO/CI 7.69/3.26. PVR 3.25 --> Sev PAH, likely primarily PV HTN; b.) RHC 04/04/2022: mRA 13, mPA 40, mPCWP 20, PA sat 59, AO sat 92, CO 7.63, CI 3.26, PVR 2.6 --> mod portal venous HTN   Pancreatitis    S/P CABG x 4 08/13/2002   a.) LIMA-LAD, LRA-OM, SVG-D1, SVG-dRCA   S/P gastric bypass    Synovitis of finger 06/11/2019   Trigger finger, unspecified little finger 12/23/2018   Type 2 diabetes mellitus with hyperglycemia, with long-term current use of insulin 09/09/2014   a.) has FreeStyle Libre CGM   Type 2 MI (myocardial infarction) (HCC)    Ulcer 05/2016   Left shin   Wears glasses     Past Surgical History:  Procedure Laterality Date   CARDIAC CATHETERIZATION N/A  08/10/2002   CATARACT EXTRACTION W/PHACO Right 10/29/2017   Procedure: CATARACT EXTRACTION PHACO AND INTRAOCULAR LENS PLACEMENT (IOC)  RIGHT DIABETIC;  Surgeon: Lockie Mola, MD;  Location: [facility];  Service: Ophthalmology;  Laterality: Right   CATARACT EXTRACTION W/PHACO Left 11/18/2017   Procedure: CATARACT EXTRACTION PHACO AND INTRAOCULAR LENS PLACEMENT (IOC) LEFT IVA/TOPICAL;  Surgeon: Lockie Mola, MD;  Location: [facility];  Service: Ophthalmology;  Laterality: Left   COLONOSCOPY WITH PROPOFOL N/A 04/22/2013   Procedure: COLONOSCOPY WITH PROPOFOL;  Surgeon: Rachael Fee, MD;  Location: [facility];  Service: Endoscopy;  Laterality: N/A;   CORONARY ANGIOPLASTY WITH STENT PLACEMENT N/A 1996   CORONARY ARTERY BYPASS GRAFT N/A 08/13/2002   Procedure: 4v CORONARY ARTERY BYPASS GRAFTING; Location: [facility]; Surgeon: Katy Apo, MD   CYSTOSCOPY WITH INSERTION OF UROLIFT N/A 12/25/2021   Procedure: CYSTOSCOPY WITH INSERTION OF UROLIFT;  Surgeon: Riki Altes, MD;  Location: [facility];  Service: Urology;  Laterality: N/A;   DG ANGIO AV SHUNT*L*  right and left upper arms   FASCIOTOMY  03/03/2012   Procedure: FASCIOTOMY;  Surgeon: Nicki Reaper, MD;  Location: [facility];  Service: Orthopedics;  Laterality: Right;  FASCIOTOMY RIGHT SMALL FINGER   FASCIOTOMY Left 08/17/2013   Procedure: FASCIOTOMY LEFT RING;  Surgeon: Nicki Reaper, MD;  Location: [facility];  Service: Orthopedics;  Laterality: Left;   INCISION AND DRAINAGE ABSCESS Left 10/15/2015   Procedure: INCISION AND DRAINAGE ABSCESS;  Surgeon: Leafy Ro, MD;  Location: [facility];  Service: General;  Laterality: Left;   KIDNEY TRANSPLANT  09/13/2010   cadaver--at [facility] HEART CATH N/A 11/15/2016   Procedure: RIGHT HEART CATH;  Surgeon: Iran Ouch, MD;  Location: [facility];  Service: Cardiovascular;  Laterality: N/A;   RIGHT HEART  CATH N/A 05/03/2019   Procedure: RIGHT HEART CATH;  Surgeon: Laurey Morale, MD;  Location: [facility] INVASIVE CV LAB;  Service: Cardiovascular;  Laterality: N/A;   RIGHT HEART CATH N/A 04/04/2022   Procedure: RIGHT HEART CATH;  Surgeon: Laurey Morale, MD;  Location: [facility] INVASIVE CV LAB;  Service: Cardiovascular;  Laterality: N/A;   ROUX-EN-Y GASTRIC BYPASS N/A 2015   TYMPANIC MEMBRANE REPAIR Left 03/2010   VASECTOMY     WART FULGURATION N/A 05/31/2022   Procedure: FULGURATION ANAL WART;  Surgeon: Sung Amabile, DO;  Location: [facility];  Service: General;  Laterality: N/A;    Current Medications: No outpatient medications have been marked as taking for the 07/26/22 encounter (Appointment) with Fransico Michael, Brenner Visconti H, PA-C.     Allergies:   Contrast media [iodinated contrast media], Iodine, and Ibuprofen   Social History   Socioeconomic History   Marital status: Married    Spouse name: Not on file   Number of children: 2   Years of education: 14   Highest education level: Associate degree: academic program  Occupational History   Occupation: Presenter, broadcasting: unemployed    Comment: disabled due to kidney failure  Tobacco Use   Smoking status: Former    Types: Cigars    Quit date: 09/02/1994    Years since quitting: 27.9    Passive exposure: Never   Smokeless tobacco: Never   Tobacco comments:    Occasional, rare cigars  Vaping Use   Vaping Use: Never used  Substance and Sexual Activity   Alcohol use: Not Currently    Comment: occ   Drug use: No   Sexual activity: Not on file  Other Topics Concern   Not on file  Social History Narrative   Has living will   Wife is health care POA   Would accept resuscitation attempts   No tube feedings if cognitively unaware   Social Determinants of Health   Financial Resource Strain: Not on file  Food Insecurity: No Food Insecurity (07/07/2022)   Hunger Vital Sign    Worried About Running Out of Food in the Last Year:  Never true    Ran Out of Food in the Last Year: Never true  Transportation Needs: No Transportation Needs (07/07/2022)   PRAPARE - Administrator, Civil Service (Medical): No    Lack of Transportation (Non-Medical): No  Physical Activity: Not on file  Stress: Not on file  Social Connections: Not on file     Family History: The patient's ***family history includes Breast cancer in his maternal grandmother; Diabetes in his maternal grandmother; Heart disease in his father; Kidney  disease in his father; Kidney failure in his father; Liver cancer in his paternal grandmother and paternal uncle; Valvular heart disease in his mother. There is no history of Prostate cancer.  ROS:   Please see the history of present illness.    *** All other systems reviewed and are negative.  EKGs/Labs/Other Studies Reviewed:    The following studies were reviewed today: ***  EKG:  EKG is *** ordered today.  The ekg ordered today demonstrates ***  Recent Labs: 04/01/2022: TSH 2.828 06/21/2022: B Natriuretic Peptide 465.0 06/25/2022: Magnesium 2.3 07/08/2022: ALT 21; BUN 70; Creatinine, Ser 2.46; Hemoglobin 10.6; Platelets 150; Potassium 4.1; Sodium 144  Recent Lipid Panel    Component Value Date/Time   CHOL 90 05/09/2022 1218   CHOL 161 03/07/2015 0823   TRIG 140 05/09/2022 1218   HDL 37 (L) 05/09/2022 1218   HDL 38 (L) 03/07/2015 0823   CHOLHDL 2.4 05/09/2022 1218   VLDL 28 05/09/2022 1218   LDLCALC 25 05/09/2022 1218   LDLCALC 105 (H) 03/07/2015 0823   LDLDIRECT 41.9 04/20/2012 1618     Risk Assessment/Calculations:   {Does this patient have ATRIAL FIBRILLATION?:[phone]}   Physical Exam:    VS:  There were no vitals taken for this visit.    Wt Readings from Last 3 Encounters:  07/19/22 267 lb 3.2 oz (121.2 kg)  07/15/22 254 lb (115.2 kg)  07/06/22 253 lb (114.8 kg)     GEN: *** Well nourished, well developed in no acute distress HEENT: Normal NECK: No JVD; No carotid  bruits LYMPHATICS: No lymphadenopathy CARDIAC: ***RRR, no murmurs, rubs, gallops RESPIRATORY:  Clear to auscultation without rales, wheezing or rhonchi  ABDOMEN: Soft, non-tender, non-distended MUSCULOSKELETAL:  No edema; No deformity  SKIN: Warm and dry NEUROLOGIC:  Alert and oriented x 3 PSYCHIATRIC:  Normal affect   ASSESSMENT:    No diagnosis found. PLAN:    In order of problems listed above:  ***  Disposition: Follow up {follow up:15908} with ***   Shared Decision Making/Informed Consent   {Are you ordering a CV Procedure (e.g. stress test, cath, DCCV, TEE, etc)?   Press F2        :[id number]}    Signed, Erna Brossard David Stall, PA-C  07/24/2022 4:50 PM    [facility]

## 2022-07-25 ENCOUNTER — Other Ambulatory Visit: Payer: Self-pay | Admitting: Student

## 2022-07-25 ENCOUNTER — Ambulatory Visit: Payer: Medicare Other | Admitting: Urology

## 2022-07-25 ENCOUNTER — Other Ambulatory Visit: Payer: Self-pay | Admitting: Urology

## 2022-07-25 ENCOUNTER — Other Ambulatory Visit: Payer: Self-pay

## 2022-07-25 MED FILL — Fluticasone Propionate Nasal Susp 50 MCG/ACT: NASAL | 30 days supply | Qty: 16 | Fill #4 | Status: AC

## 2022-07-25 NOTE — Telephone Encounter (Signed)
Scheduled for hospital f/u with Cadence on 07/26/22.

## 2022-07-26 ENCOUNTER — Ambulatory Visit: Payer: Commercial Managed Care - PPO | Admitting: Medical

## 2022-07-26 ENCOUNTER — Other Ambulatory Visit: Payer: Self-pay

## 2022-07-26 NOTE — Telephone Encounter (Signed)
Misty Stanley, please advise on refill request and f/u.  Thanks!

## 2022-07-26 NOTE — Telephone Encounter (Signed)
last visit with Dr. Kirke Corin 04/19/2021--Disposition: Follow-up in 6 months.  NS 02/17/22  cx 07/26/22 hospitl f/u with Cadance with reason:  Patient (pt states he has appt with Dr. Shirlee Latch on Monday, he feels he doen't have need this appt, he doesn't want tohave to pay for two appts.)

## 2022-07-28 ENCOUNTER — Other Ambulatory Visit: Payer: Self-pay

## 2022-07-28 MED ORDER — TESTOSTERONE 10 MG/ACT (2%) TD GEL
1.0000 | Freq: Every day | TRANSDERMAL | 0 refills | Status: DC
  Filled 2022-07-28: qty 60, 30d supply, fill #0

## 2022-07-29 ENCOUNTER — Inpatient Hospital Stay: Payer: Commercial Managed Care - PPO

## 2022-07-29 ENCOUNTER — Encounter: Payer: Self-pay | Admitting: Cardiology

## 2022-07-29 ENCOUNTER — Other Ambulatory Visit: Payer: Self-pay | Admitting: Student

## 2022-07-29 ENCOUNTER — Other Ambulatory Visit: Payer: Self-pay

## 2022-07-29 ENCOUNTER — Ambulatory Visit (HOSPITAL_BASED_OUTPATIENT_CLINIC_OR_DEPARTMENT_OTHER): Payer: Commercial Managed Care - PPO | Admitting: Cardiology

## 2022-07-29 ENCOUNTER — Other Ambulatory Visit
Admission: RE | Admit: 2022-07-29 | Discharge: 2022-07-29 | Disposition: A | Discharge status: Home / Self Care | Payer: Commercial Managed Care - PPO | Source: Ambulatory Visit | Attending: Cardiology | Admitting: Cardiology

## 2022-07-29 ENCOUNTER — Inpatient Hospital Stay: Payer: Commercial Managed Care - PPO | Admitting: Oncology

## 2022-07-29 VITALS — BP 132/82 | HR 66 | Wt 262.8 lb

## 2022-07-29 DIAGNOSIS — I5022 Chronic systolic (congestive) heart failure: Secondary | ICD-10-CM | POA: Diagnosis not present

## 2022-07-29 LAB — BASIC METABOLIC PANEL
Anion gap: 10 (ref 5–15)
BUN: 72 mg/dL — ABNORMAL HIGH (ref 8–23)
CO2: 34 mmol/L — ABNORMAL HIGH (ref 22–32)
Calcium: 9.4 mg/dL (ref 8.9–10.3)
Chloride: 98 mmol/L (ref 98–111)
Creatinine, Ser: 2.06 mg/dL — ABNORMAL HIGH (ref 0.61–1.24)
GFR, Estimated: 36 mL/min — ABNORMAL LOW (ref >60)
Glucose, Bld: 123 mg/dL — ABNORMAL HIGH (ref 70–99)
Potassium: 3.8 mmol/L (ref 3.5–5.1)
Sodium: 142 mmol/L (ref 135–145)

## 2022-07-29 LAB — BRAIN NATRIURETIC PEPTIDE: B Natriuretic Peptide: 521.6 pg/mL — ABNORMAL HIGH (ref 0.0–100.0)

## 2022-07-29 NOTE — Progress Notes (Signed)
Advanced Heart Failure Clinic Note   Date:  07/29/2022   ID:  [name]., DOB 1960-08-11, MRN [id number]   Provider location: [address], [place] Type of Visit:  Established patient  PCP:  [name], MD  Cardiologist:  Dr. Kirke Corin Nephrology: Dr. Yetta [name]  HF Cardiologist: Dr. Shirlee Latch  HPI: Carl Kenner Agro. is a 62 y.o. male  with a history of CAD s/p CABG 2005, chronic atrial fibrillation on Eliquis, ESRD s/p renal transplant 2012, anc chronic diastolic CHF.  His renal function has been fairly stable, most recent creatinine 1.43.  He has been struggling with volume overload/CHF.  He has a history of ischemic cardiomyopathy with EF as low as 35-40% in the past but echo in 3/20 showed EF 60-65%.  There is evidence for RV failure with pulmonary hypertension by echo.    Admitted 11/05/18 low grade fever and shortness of breath. HS Trop negative. Covid 19 negative. Blood CX negative. Placed on antibiotics and discharged to home the next day.   Admitted 05/03/19 with A/C Diastolic HF. Diuresed with IV lasix and transitioned to torsemide 80/40 mg . Discharge weight 252 pounds.   Admitted 06/2019 with left arm pain. Left upper extremity venous Doppler revealed occluded cephalic vein at the AV fistula outflow tract in the antecubital fossa with no other thrombus identified. Vascular evaluated. He continued on Eliquis.  No plan for intervention. Also treated for acute gout flare. Discharged on prednisone taper.     Given IV lasix on 12/21 by HF Paramedicine.   Admitted to [facility] 2/22 with acute gout flare vs cellulitis. Nephrology was consulted to evaluate transplant regimen and follow along. He was treated with antibiotics and prednisone. Discharged on decreased torsemide dose of 40 mg daily. Weight 259 lbs.  Admitted 4/22 for left leg cellulitis after sustaining a dog scratch, received IV antibiotics. No changes to his diuretic therapy. Discharge weight 266  lbs.  Follow up with Dr. Kirke Corin, 1/23, he had CP and underwent Lexiscan myoview, which overall was a low risk study with evidence of prior infarct in the infero-apical area with no significant change from before. Losartan added to GDMT regimen.  Follow up 5/23, volume overloaded, REDs 40%. Advised taking additional metolazone (3 doses that week), however labs showed worsening renal function and advised to keep at 2 doses.  Admitted in 5/23 with acute on chronic CHF and AKI.  Wonda Cerise and losartan stopped. Diuresed with IV lasix. Echo showed EF 40-45%, RV mildly reduced, GIIIDD (restrictive), IVC dilated. Nephrology consulted for diuretic dosing. Discharged home, weight 266 lbs.  He had chest pain in 8/23 and was admitted at [facility], minimal HS-TnI elevation with no trend, thought to be demand ischemia from volume overload.    Echo 8/23 showing EF 40-45%, diffuse hypokinesis with dyssynchrony, mildly dilated RV with mildly decreased systolic function, IVC dilated, moderate AS with mean gradient 15, mild-moderate mitral stenosis with mean gradient 6.   Follow up 03/27/22, volume overloaded with 25 lbs weight gain. Instructed to use Furoscix x 3 days, then resume home dose of torsemide + metolazone. Clinic weight 277 lbs.  Admitted from [facility] 04/01/22 with a/c CHF. Echo showed EF 40-45%, RV moderately reduced and RVSP 49 mmHg. RHC showed elevated filling pressures, moderate primarily pulmonary venous hypertension and preserved CO. Tadalafil stopped with primarily PH group 2 and 3. He was diuresed with IV lasix and metolazone, and GDMT added back. Overall down 19 lbs. He was  discharged home, weight 255 lbs.  Patient was admitted in 3/24 with Serratia bacteremia/pyelonephritis, treated with ertapenem.  He also had campylobacter diarrhea.  In 4/24, he tripped getting off his riding lawnmower and fell, fracturing ribs. He had a pulmonary contusion with this.   Today he returns for HF follow up. He is back on  torsemide 100 mg bid with metolazone 3 days/week. Still with some right-sided chest pain from rib fractures.  No lightheadedness.  He is walking for exercise, minimal dyspnea walking on flat ground.  No orthopnea/PND.  No palpitations, he remains in chronic atrial fibrillation.   Labs (7/19): LDL 49 Labs (2/20): K 3.7, creatinine 1.43 Labs (4/20): K 4.3, creatinine 1.24 Labs (11/06/18): K 3.7 creatinine 1.23  Labs (05/07/19): K 3.8 Creatinine 1.5  Labs (07/18/19): K 4.3 Creatinine 1.42  Labs (5/21): K 3.3, creatinine 1.21 Labs (11/21): K 3.6, creatinine 1.4 Labs (12/21): K 3.3, creatinine 2.4, hgb 12.8 Labs (04/01/19): K 3.3 Creatinine 2.2  Labs (8/22): K 4.0, creatinine 1.6, HDL 35, LDL 36, a1c 7.5 Labs (2/23): LDL 31, HDL 36 Labs (4/23): K 3.3, creatinine 1.68 Labs (5/23): K 4.1, creatinine 2.24 Labs (8/23): K 3.5, creatinine 2.36 Labs (12/23): K 3.6, creatinine 1.99, hgb 9.3 Labs (1/24): K 4.6, creatinine 2.44 Labs (2/24): K 3.4, creatinine 2.75, LDL 25 Labs (4/24): K 4.1, creatinine 2.46  PMH: 1. CAD: s/p CABG x 4 in [place] in 2005.  - Cardiolite (1/20): EF 47%, small fixed apical septal defect, no ischemia.  Low risk study.  - Cardiolite (12/22): small apical inferior defect, no changes from prior, no ischemia. Low risk study. 2. Asthma 3. Gout 4. Atrial fibrillation: Chronic.  5. ESRD s/p renal transplantation at [facility] in 2012.  6. Anemia of chronic disease 7. Type II diabetes 8. HTN 9. Hyperlipidemia 10. OSA: On bipap.  11. Ischemic cardiomyopathy: EF 35-40% in past.  - Cardiolite (1/20) with EF 47%.  - Echo (1/20): EF 50-55%, mild MR, RV moderately dilated with moderately decreased systolic function, PASP 73 mmHg, moderate TR.   - Echo (3/20): EF 60-65%, PASP 75 mmHg, mildly dilated RV with mildly decreased systolic function.  - Echo (2/21): EF 45-50%, global HK, moderately dilated RV with moderately decreased systolic function, D-shaped interventricular septum, dilated  IVC.  - RHC (2/21): mean RA 19, PA 78/31 mean 51, mean PCWP 26, CI 3.26, PVR 3.25 - Echo (5/23): EF 40-45%, RV mildly reduced, GIIIDD (restrictive) - Echo (8/23): EF 40-45%, diffuse hypokinesis with dyssynchrony, mildly dilated RV with mildly decreased systolic function, IVC dilated, moderate AS with mean gradient 15, mild-moderate mitral stenosis with mean gradient 6.  - Echo (1/24): Echo showed EF 40-45%, RV moderately reduced and RVSP 49 mmHg, mild MR, no AS, dilated IVC.  - RHC (1/24): RA mean 13, PA 62/24 (mean 40), PCWP mean 20, CO/CI (Fick) 7.63/3.26, PVR 2.6 WU, PAPi 2.9 12. Obesity: s/p gastric bypass in 2015.  13. Bradycardia with beta blocker.  14. Aortic stenosis: Moderate on 8/23 echo.  15. Mitral stenosis: Mild to moderate on 8/23 echo.  16. Rheumatoid arthritis  Past Surgical History:  Procedure Laterality Date   CARDIAC CATHETERIZATION N/A 08/10/2002   CATARACT EXTRACTION W/PHACO Right 10/29/2017   Procedure: CATARACT EXTRACTION PHACO AND INTRAOCULAR LENS PLACEMENT (IOC)  RIGHT DIABETIC;  Surgeon: Lockie Mola, MD;  Location: [facility];  Service: Ophthalmology;  Laterality: Right   CATARACT EXTRACTION W/PHACO Left 11/18/2017   Procedure: CATARACT EXTRACTION PHACO AND INTRAOCULAR LENS PLACEMENT (IOC) LEFT IVA/TOPICAL;  Surgeon: Lockie Mola, MD;  Location: [facility];  Service: Ophthalmology;  Laterality: Left   COLONOSCOPY WITH PROPOFOL N/A 04/22/2013   Procedure: COLONOSCOPY WITH PROPOFOL;  Surgeon: Rachael Fee, MD;  Location: [facility];  Service: Endoscopy;  Laterality: N/A;   CORONARY ANGIOPLASTY WITH STENT PLACEMENT N/A 1996   CORONARY ARTERY BYPASS GRAFT N/A 08/13/2002   Procedure: 4v CORONARY ARTERY BYPASS GRAFTING; Location: [facility]; Surgeon: Katy Apo, MD   CYSTOSCOPY WITH INSERTION OF UROLIFT N/A 12/25/2021   Procedure: CYSTOSCOPY WITH INSERTION OF UROLIFT;  Surgeon: Riki Altes, MD;  Location: [facility];  Service:  Urology;  Laterality: N/A;   DG ANGIO AV SHUNT*L*     right and left upper arms   FASCIOTOMY  03/03/2012   Procedure: FASCIOTOMY;  Surgeon: Nicki Reaper, MD;  Location: [facility];  Service: Orthopedics;  Laterality: Right;  FASCIOTOMY RIGHT SMALL FINGER   FASCIOTOMY Left 08/17/2013   Procedure: FASCIOTOMY LEFT RING;  Surgeon: Nicki Reaper, MD;  Location: [facility];  Service: Orthopedics;  Laterality: Left;   INCISION AND DRAINAGE ABSCESS Left 10/15/2015   Procedure: INCISION AND DRAINAGE ABSCESS;  Surgeon: Leafy Ro, MD;  Location: [facility];  Service: General;  Laterality: Left;   KIDNEY TRANSPLANT  09/13/2010   cadaver--at [facility] HEART CATH N/A 11/15/2016   Procedure: RIGHT HEART CATH;  Surgeon: Iran Ouch, MD;  Location: [facility] CV LAB;  Service: Cardiovascular;  Laterality: N/A;   RIGHT HEART CATH N/A 05/03/2019   Procedure: RIGHT HEART CATH;  Surgeon: Laurey Morale, MD;  Location: [facility] INVASIVE CV LAB;  Service: Cardiovascular;  Laterality: N/A;   RIGHT HEART CATH N/A 04/04/2022   Procedure: RIGHT HEART CATH;  Surgeon: Laurey Morale, MD;  Location: [facility] INVASIVE CV LAB;  Service: Cardiovascular;  Laterality: N/A;   ROUX-EN-Y GASTRIC BYPASS N/A 2015   TYMPANIC MEMBRANE REPAIR Left 03/2010   VASECTOMY     WART FULGURATION N/A 05/31/2022   Procedure: FULGURATION ANAL WART;  Surgeon: Sung Amabile, DO;  Location: [facility];  Service: General;  Laterality: N/A;   Current Outpatient Medications  Medication Sig Dispense Refill   albuterol (PROVENTIL) (2.5 MG/3ML) 0.083% nebulizer solution Take 3 mLs (2.5 mg total) by nebulization every 6 (six) hours as needed for wheezing or shortness of breath. 150 mL 0   albuterol (VENTOLIN HFA) 108 (90 Base) MCG/ACT inhaler Inhale 2 puffs into the lungs every 6 (six) hours as needed for wheezing or shortness of breath. 6.7 g 5   AMBULATORY NON FORMULARY MEDICATION Trimix  (30/1/10)-(Pap/Phent/PGE)  Test Dose  1ml vial   Qty #3 Refills 0  [facility] [phone] Fax [phone] 3 mL 0   apixaban (ELIQUIS) 5 MG TABS tablet Take 1 tablet (5 mg total) by mouth 2 (two) times daily. Hold till 07/10/2022 and restart on 07/11/22 180 tablet 1   buPROPion (WELLBUTRIN SR) 150 MG 12 hr tablet Take 1 tablet (150 mg total) by mouth 2 (two) times daily. 180 tablet 3   cholecalciferol (VITAMIN D3) 25 MCG (1000 UNIT) tablet Take 1,000 Units by mouth at bedtime.     colchicine 0.6 MG tablet Take 1 tablet (0.6 mg total) by mouth every other day. 45 tablet 1   Continuous Glucose Sensor (FREESTYLE LIBRE 3 SENSOR) MISC Use one every 2 weeks. 6 each 3   cyanocobalamin 100 MCG tablet Take 100 mcg by mouth daily.     dapagliflozin propanediol (  FARXIGA) 10 MG TABS tablet Take 1 tablet (10 mg total) by mouth daily before breakfast. 90 tablet 3   febuxostat (ULORIC) 40 MG tablet Take 1 tablet (40 mg total) by mouth daily. 90 tablet 5   fluticasone (FLONASE) 50 MCG/ACT nasal spray PLACE 2 SPRAYS INTO BOTH NOSTRILS DAILY. 16 g 11   gabapentin (NEURONTIN) 300 MG capsule Take 1 capsule (300 mg total) by mouth 2 (two) times daily. 180 capsule 3   hydrALAZINE (APRESOLINE) 25 MG tablet Take 1 tablet (25 mg total) by mouth every 8 (eight) hours. 90 tablet 1   HYDROcodone-acetaminophen (NORCO/VICODIN) 5-325 MG tablet Take 1-2 tablets by mouth every 6 (six) hours as needed for moderate pain. 40 tablet 0   hydrocortisone 2.5 % cream Apply topically 3 (three) times daily as needed. 30 g 3   insulin glargine-yfgn (SEMGLEE) 100 UNIT/ML Pen Inject 40 Units into the skin daily. (Patient taking differently: Inject 35 Units into the skin daily.) 45 mL 3   insulin lispro (HUMALOG KWIKPEN) 100 UNIT/ML KwikPen Inject 10-20 Units into the skin 3 (three) times daily. Per sliding scale     Insulin Pen Needle (UNIFINE PENTIPS) 31G X 5 MM MISC Use 4 times daily as directed 400 each 3   isosorbide  mononitrate (IMDUR) 30 MG 24 hr tablet Take 1 tablet (30 mg total) by mouth daily. 30 tablet 2   lidocaine (LIDODERM) 5 % Place 1 patch onto the skin daily.PLACE 1 PATCH ONTO THE SKIN FOR 12 (TWELVE) HOURS. REMOVE & DISCARD PATCH WITHIN 12 HOURS OR AS DIRECTED BY MD (Patient taking differently: Place 1 patch onto the skin daily as needed.) 60 patch 11   lidocaine (XYLOCAINE) 5 % ointment Apply 1 Application topically 3 (three) times daily as needed. Apply pea size amount to area as needed for pain. 50 g 0   losartan (COZAAR) 25 MG tablet Take 1 tablet (25 mg total) by mouth daily. 90 tablet 3   metolazone (ZAROXOLYN) 2.5 MG tablet Take 1 tablet by mouth 3 times weekly. 15 tablet 3   montelukast (SINGULAIR) 10 MG tablet TAKE 1 TABLET BY MOUTH AT BEDTIME. (Patient taking differently: Take 10 mg by mouth at bedtime.) 90 tablet 3   Multiple Vitamin (MULTIVITAMIN WITH MINERALS) TABS tablet Take 1 tablet by mouth daily.     mycophenolate (MYFORTIC) 180 MG EC tablet Take 2 tablets (360 mg total) by mouth 2 (two) times daily. 360 tablet 3   omeprazole (PRILOSEC) 20 MG capsule TAKE 1 CAPSULE BY MOUTH DAILY. (Patient taking differently: Take 20 mg by mouth at bedtime.) 90 capsule 3   predniSONE (DELTASONE) 5 MG tablet Take 1 tablet (5 mg total) by mouth daily. 90 tablet 3   rosuvastatin (CRESTOR) 10 MG tablet Take 1 tablet (10 mg total) by mouth daily. (Patient taking differently: Take 10 mg by mouth at bedtime.) 90 tablet 3   sulfamethoxazole-trimethoprim (BACTRIM) 400-80 MG tablet Take 1 tablet by mouth 3 (three) times a week every Monday, Wednesday, and Friday. 36 tablet 3   tacrolimus (PROGRAF) 1 MG capsule Take 2 capsules (2 mg total) by mouth 2 times daily. 120 capsule 11   tamsulosin (FLOMAX) 0.4 MG CAPS capsule Take 1 capsule (0.4 mg total) by mouth daily. 90 capsule 3   Testosterone 10 MG/ACT (2%) GEL Apply 1 Pump topically daily to inner thighs - 2 pumps total. 60 g 0   torsemide (DEMADEX) 100 MG  tablet Take 1 tablet (100 mg total) by mouth 2 (two)  times daily. 60 tablet 5   zinc sulfate 220 (50 Zn) MG capsule Take 220 mg by mouth daily.     iron polysaccharides (FERREX 150) 150 MG capsule Take 1 capsule (150 mg total) by mouth 2 (two) times daily. (Patient not taking: Reported on 07/29/2022) 180 capsule 0   No current facility-administered medications for this visit.   Allergies:   Contrast media [iodinated contrast media], Iodine, and Ibuprofen   Social History:  The patient  reports that he quit smoking about 27 years ago. His smoking use included cigars. He has never been exposed to tobacco smoke. He has never used smokeless tobacco. He reports that he does not currently use alcohol. He reports that he does not use drugs.   Family History:  The patient's family history includes Breast cancer in his maternal grandmother; Diabetes in his maternal grandmother; Heart disease in his father; Kidney disease in his father; Kidney failure in his father; Liver cancer in his paternal grandmother and paternal uncle; Valvular heart disease in his mother.   ROS:  Please see the history of present illness.   All other systems are personally reviewed and negative.   Recent Labs: 04/01/2022: TSH 2.828 06/25/2022: Magnesium 2.3 07/08/2022: ALT 21; Hemoglobin 10.6; Platelets 150 07/29/2022: B Natriuretic Peptide 521.6; BUN 72; Creatinine, Ser 2.06; Potassium 3.8; Sodium 142  Personally reviewed   Wt Readings from Last 3 Encounters:  07/29/22 262 lb 12.8 oz (119.2 kg)  07/19/22 267 lb 3.2 oz (121.2 kg)  07/15/22 254 lb (115.2 kg)    BP 132/82   Pulse 66   Wt 262 lb 12.8 oz (119.2 kg)   SpO2 99%   BMI 37.71 kg/m  General: NAD Neck: JVP 8-9 cm, no thyromegaly or thyroid nodule.  Lungs: Clear to auscultation bilaterally with normal respiratory effort. CV: Nondisplaced PMI.  Heart irregular S1/S2, no S3/S4, 2/6 SEM RUSB.   1+ edema to knees.  No carotid bruit.  Normal pedal pulses.  Abdomen: Soft,  nontender, no hepatosplenomegaly, no distention.  Skin: Intact without lesions or rashes.  Neurologic: Alert and oriented x 3.  Psych: Normal affect. Extremities: No clubbing or cyanosis.  HEENT: Normal.   Assessment & Plan: 1. Chronic heart failure with Mid Range EF/ With prominent RV failure/pulmonary hypertension:  Ischemic cardiomyopathy.  ?If RV failure is related to severe OSA, he was a remote smoker. Echo in 2/21 with EF 45-50%, moderately dilated RV with moderately decreased systolic function, moderate pulmonary hypertension. Echo in 1/24 showed EF 40-45% , RV mod reduced, RVSP 49 mmHg, no AS, mild MR. Management complicated by CKD stage 3b.  Today, NYHA class II in terms of dyspnea.  However, he still has some volume overload by exam.  Weight is stable.  - Continue torsemide 100 mg bid.  Continue metolazone M/W/F, but this week will also give a Tuesday dose. He needs to watch his sodium and fluid intake better.  - Continue 60 KCl bid. - Continue current hydralazine/Imdur (he will confirm that he is actually taking Imdur).  - Continue Farxiga 10 mg daily. Will need to watch closely for further UTIs.  Had recent Serratia pyelonephritis.  - Continue losartan 25 mg daily.  May need to stop if creatinine rises further.  - Tadalafil was stopped due to group 2 and 3 PH (not group 1).  2. Atrial fibrillation: Chronic. Rate controlled.  - Continue Eliquis.  - Not on beta blocker with history of bradycardia.   3. CAD: S/p CABG 2005.  Cardiolite 12/22 with prior MI, no ischemia.  No chest pain. High threshold for cath given CKD.  - No ASA given Eliquis use.  - Continue Crestor 10 mg daily, good lipids in 2/24. 4. CKD Stage 3b: S/p renal transplantation in 2012. Followed by transplant center at [facility]. He remains on mycophenolate and tacrolimus.    - BMET today. 5. OSA: Continue Bipap every night.  6. HTN: BP controlled on current regimen.   7. DM 2: On insulin. He follows with  Endocrinology. - Continue dapagliflozin for now but watch for further GU symptoms. 8. Pulmonary hypertension: Primarily pulmonary venous hypertension by RHC in 1/24 though there may be group 3 PH component from OSA.  - Now off tadalafil.  9. Chronic Anemia: has been getting scheduled weekly IV Fe infusions, q Fridays   Followup in 3 wks   Marca Ancona 07/29/2022

## 2022-07-29 NOTE — Patient Instructions (Signed)
Medication Changes:  Take an extra dose of metolazone tomorrow, Tuesday, 07/30/22 Then resume normal schedule  Please call our office when you are home to confirm whether you are/or are not Taking Losartan and Imdur/Isosorbide  Lab Work:  Labs done today, your results will be available in MyChart, we will contact you for abnormal readings.    Special Instructions // Education:  Do the following things EVERYDAY: Weigh yourself in the morning before breakfast. Write it down and keep it in a log. Take your medicines as prescribed Eat low salt foods--Limit salt (sodium) to 2000 mg per day.  Stay as active as you can everyday Limit all fluids for the day to less than 2 liters   Follow-Up in: 3 weeks with Dr. Shirlee Latch    If you have any questions or concerns before your next appointment please send [name] a message through [facility] or call our office at [phone] Monday-Friday 8 am-5 pm.   If you have an urgent need after hours on the weekend please call your Primary Cardiologist or the Advanced Heart Failure Clinic in [place] at [phone].

## 2022-07-30 ENCOUNTER — Telehealth: Payer: Self-pay

## 2022-07-30 ENCOUNTER — Other Ambulatory Visit: Payer: Self-pay

## 2022-07-30 NOTE — Telephone Encounter (Signed)
Returned patient phone call and let him know to continue the Losartan and Imdur. But that if he chose to take Cialis then he would need to quit taking the Imdur as they will interact with one another.

## 2022-07-30 NOTE — Telephone Encounter (Signed)
He can continue losartan and Imdur if he is currently taking.  Cialis WILL interact with Imdur.  Would not take the combination.  If he wants to take Cialis, will have to stop Imdur.

## 2022-07-30 NOTE — Telephone Encounter (Signed)
Patient contacted the office per Dr. Alford Highland request and confirmed that he is currently taking Losartan and Imdur. Patient also asked if taking Cialis will interact with any of his other medication.

## 2022-07-31 ENCOUNTER — Other Ambulatory Visit: Payer: Self-pay

## 2022-07-31 MED FILL — Isosorbide Mononitrate Tab ER 24HR 30 MG: ORAL | 90 days supply | Qty: 90 | Fill #0 | Status: AC

## 2022-08-01 ENCOUNTER — Other Ambulatory Visit: Payer: Self-pay

## 2022-08-05 ENCOUNTER — Inpatient Hospital Stay: Payer: Commercial Managed Care - PPO | Attending: Oncology | Admitting: Oncology

## 2022-08-05 ENCOUNTER — Encounter: Payer: Self-pay | Admitting: Oncology

## 2022-08-05 ENCOUNTER — Inpatient Hospital Stay: Payer: Commercial Managed Care - PPO

## 2022-08-05 ENCOUNTER — Telehealth: Payer: Self-pay | Admitting: *Deleted

## 2022-08-05 VITALS — BP 140/62 | HR 71 | Temp 95.9°F | Resp 18 | Ht 70.0 in | Wt 265.5 lb

## 2022-08-05 DIAGNOSIS — D649 Anemia, unspecified: Secondary | ICD-10-CM | POA: Insufficient documentation

## 2022-08-05 DIAGNOSIS — Z87891 Personal history of nicotine dependence: Secondary | ICD-10-CM | POA: Insufficient documentation

## 2022-08-05 LAB — CBC WITH DIFFERENTIAL/PLATELET
Abs Immature Granulocytes: 0.02 10*3/uL (ref 0.00–0.07)
Basophils Absolute: 0 10*3/uL (ref 0.0–0.1)
Basophils Relative: 1 %
Eosinophils Absolute: 0 10*3/uL (ref 0.0–0.5)
Eosinophils Relative: 1 %
HCT: 38.7 % — ABNORMAL LOW (ref 39.0–52.0)
Hemoglobin: 11.7 g/dL — ABNORMAL LOW (ref 13.0–17.0)
Immature Granulocytes: 0 %
Lymphocytes Relative: 11 %
Lymphs Abs: 0.7 10*3/uL (ref 0.7–4.0)
MCH: 25.2 pg — ABNORMAL LOW (ref 26.0–34.0)
MCHC: 30.2 g/dL (ref 30.0–36.0)
MCV: 83.2 fL (ref 80.0–100.0)
Monocytes Absolute: 0.2 10*3/uL (ref 0.1–1.0)
Monocytes Relative: 3 %
Neutro Abs: 5.3 10*3/uL (ref 1.7–7.7)
Neutrophils Relative %: 84 %
Platelets: 153 10*3/uL (ref 150–400)
RBC: 4.65 MIL/uL (ref 4.22–5.81)
RDW: 20 % — ABNORMAL HIGH (ref 11.5–15.5)
WBC: 6.2 10*3/uL (ref 4.0–10.5)
nRBC: 0 % (ref 0.0–0.2)

## 2022-08-05 LAB — COMPREHENSIVE METABOLIC PANEL
ALT: 14 U/L (ref 0–44)
AST: 21 U/L (ref 15–41)
Albumin: 4 g/dL (ref 3.5–5.0)
Alkaline Phosphatase: 128 U/L — ABNORMAL HIGH (ref 38–126)
Anion gap: 16 — ABNORMAL HIGH (ref 5–15)
BUN: 81 mg/dL — ABNORMAL HIGH (ref 8–23)
CO2: 31 mmol/L (ref 22–32)
Calcium: 9 mg/dL (ref 8.9–10.3)
Chloride: 93 mmol/L — ABNORMAL LOW (ref 98–111)
Creatinine, Ser: 2.25 mg/dL — ABNORMAL HIGH (ref 0.61–1.24)
GFR, Estimated: 32 mL/min — ABNORMAL LOW (ref >60)
Glucose, Bld: 140 mg/dL — ABNORMAL HIGH (ref 70–99)
Potassium: 2.7 mmol/L — CL (ref 3.5–5.1)
Sodium: 140 mmol/L (ref 135–145)
Total Bilirubin: 0.8 mg/dL (ref 0.3–1.2)
Total Protein: 8 g/dL (ref 6.5–8.1)

## 2022-08-05 LAB — RETICULOCYTES
Immature Retic Fract: 12.6 % (ref 2.3–15.9)
RBC.: 4.64 MIL/uL (ref 4.22–5.81)
Retic Count, Absolute: 78.4 10*3/uL (ref 19.0–186.0)
Retic Ct Pct: 1.7 % (ref 0.4–3.1)

## 2022-08-05 LAB — TSH: TSH: 2.077 u[IU]/mL (ref 0.350–4.500)

## 2022-08-05 LAB — FOLATE: Folate: 9.3 ng/mL (ref >5.9)

## 2022-08-05 LAB — FERRITIN: Ferritin: 55 ng/mL (ref 24–336)

## 2022-08-05 LAB — VITAMIN B12: Vitamin B-12: 436 pg/mL (ref 180–914)

## 2022-08-05 NOTE — Telephone Encounter (Signed)
Dr. Smith Robert wanted me to get in touch with nephrology that sent [name] ref. For IDA. I called  [phone] and the person on the call states that I need to faxe it and the fax # is [phone]. The fax went through and told them on the phone he has  potassium 2.7 and the kidney doctor to see what to do with this low level.

## 2022-08-05 NOTE — Progress Notes (Signed)
Hematology/Oncology Consult note [facility] [phone] [phone]  Patient Care Team: [name], MD as PCP - General (Pediatrics) Laurey Morale, MD as PCP - Advanced Heart Failure (Cardiology) Iran Ouch, MD as PCP - Cardiology (Cardiology) Delma Freeze, FNP as Nurse Practitioner (Family Medicine)   Name of the patient: [name]  [id number]  June 23, 1960    Reason for referral-anemia   Referring physician-Dr. Elizabeth Sauer   Date of visit: 08/05/22   History of presenting illness- patient is a 62 year old male with a past medical history is significant for stage IV CKD, ischemic cardiomyopathy, atrial fibrillation, hypertension hyperlipidemia, CAD among other medical problems.  He has undergone kidney transplant back in June 2012.  He is presently on maintenance tacrolimus and mycophenolate.  He has been referred to [name] for anemia.  CBC from 07/08/2022 showed white cell count of 5, H&H of 10.6/37 with an MCV of 82 and a platelet count of 150.  Looking back at his CBC his hemoglobin has been around 10 for the last 3 months.  Up until June 2023 his hemoglobin was close to 12.  Patient has been following up with Dr. Elizabeth Sauer from [facility] nephrology.  He has been receiving EPO as well as iron injections through them.  He last received IV iron about 3 weeks ago.  He has been receiving EPO injections about once a month and would like to transition over here for EPO and iron   ECOG PS- 1  Pain scale- 0   Review of systems- Review of Systems  Constitutional:  Negative for chills, fever, malaise/fatigue and weight loss.  HENT:  Negative for congestion, ear discharge and nosebleeds.   Eyes:  Negative for blurred vision.  Respiratory:  Negative for cough, hemoptysis, sputum production, shortness of breath and wheezing.   Cardiovascular:  Negative for chest pain, palpitations, orthopnea and claudication.   Gastrointestinal:  Negative for abdominal pain, blood in stool, constipation, diarrhea, heartburn, melena, nausea and vomiting.  Genitourinary:  Negative for dysuria, flank pain, frequency, hematuria and urgency.  Musculoskeletal:  Negative for back pain, joint pain and myalgias.  Skin:  Negative for rash.  Neurological:  Negative for dizziness, tingling, focal weakness, seizures, weakness and headaches.  Endo/Heme/Allergies:  Does not bruise/bleed easily.  Psychiatric/Behavioral:  Negative for depression and suicidal ideas. The patient does not have insomnia.     Allergies  Allergen Reactions   Contrast Media [Iodinated Contrast Media] Other (See Comments)    Kidney transplant   Iodine Other (See Comments)    Other reaction(s): Other (See Comments) Kidney transplant   Ibuprofen Other (See Comments)    Due to kidney transplant Due to kidney transplant Due to kidney transplant    Patient Active Problem List   Diagnosis Date Noted   Pain and swelling of right lower leg 07/19/2022   Multiple rib fractures 07/15/2022   Multiple closed fractures of ribs of right side 07/07/2022   Accidental fall 07/07/2022   CKD (chronic kidney disease) stage 4, GFR 15-29 ml/min (HCC) 07/07/2022   Chronic anticoagulation 07/07/2022   Right pulmonary contusion 07/07/2022   Recent Serratia bacteremia secondary to pyelonephritis s/p IV ertapenem 3/29 - 07/05/2022 07/07/2022   Campylobacter diarrhea 07/07/2022   Bacteremia due to Gram-negative bacteria 06/24/2022   Serratia infection 06/24/2022   Complicated UTI (urinary tract infection) 06/24/2022   Ischemic cardiomyopathy 06/22/2022   Permanent atrial fibrillation (HCC) 06/22/2022   Pyelonephritis 06/21/2022   Sepsis (HCC) 06/21/2022  Acute respiratory failure with hypoxia (HCC) 06/21/2022   Anogenital warts in male 05/01/2022   Acute on chronic systolic heart failure, NYHA class 3 (HCC) 04/01/2022   Internal hemorrhoids 01/21/2022   Type 2 MI  (myocardial infarction) (HCC) 11/02/2021   Depression 11/02/2021   Pancreatitis 11/02/2021   Chronic atrial fibrillation (HCC)    PAH (pulmonary artery hypertension) (HCC)    Type II diabetes mellitus with renal manifestations (HCC)    Obesity with body mass index of 30.0-39.9    Normocytic anemia    Preventative health care 10/30/2021   Mood disorder (HCC) 10/30/2021   Morbid obesity (HCC) 10/30/2021   Chronic kidney disease, stage 3b (HCC) 08/15/2021   GERD (gastroesophageal reflux disease) 04/26/2021   Attention or concentration deficit 04/26/2021   Erectile dysfunction due to arterial insufficiency 10/20/2019   Pulmonary hypertension due to left heart disease (HCC) 01/20/2019   Superficial thrombophlebitis 12/23/2018   Chronic combined systolic and diastolic CHF (congestive heart failure) (HCC) 11/19/2016   Chronic venous insufficiency 06/05/2016   Proteinuria 06/23/2015   BPH with obstruction/lower urinary tract symptoms 09/25/2014   Hypogonadism in male 09/25/2014   Immunosuppressed status (HCC) 09/09/2014   Uncontrolled type 2 diabetes mellitus with hyperglycemia, with long-term current use of insulin (HCC) 09/09/2014   Benign neoplasm of colon 04/22/2013   Diverticulosis of colon 04/22/2013   Renal transplant recipient 03/01/2013   Asthma, persistent controlled 02/25/2013   Obstructive sleep apnea 09/29/2012   Gout 09/13/2012   Obesity hypoventilation syndrome (HCC) 03/31/2012   Essential hypertension 12/17/2010   Hyperlipidemia    CAD s/p CABG 2005 2005     Past Medical History:  Diagnosis Date   Anal condyloma    Anemia    Aortic atherosclerosis (HCC)    Aortic stenosis    a.) TTE 10/2021: Mod AS. AVA 1.06cm^2 (VTI). Mean grad ; b.) TTE 04/02/2022: no AV stenosis   Asthma, persistent controlled 02/25/2013   Attention or concentration deficit 04/26/2021   BPH with obstruction/lower urinary tract symptoms 09/25/2014   CAD (coronary artery disease) 2005    a.) LHC 1996 -> HG stenosis mLAD -> PTCA/PCI with stent x 1 (unk type) -> complicated by CFA pseudoanurysm (required surg);  b.) LHC 08/10/2002  -> 95% LAD, 70% ISR LAD, 80% pOM, 70% mRCA --> CVTS consult; c.) 4v CABG 08/13/2002; c.) 03/2018 MV: Small, fixed inferoapical and apical sep defect. No isc. EF 47% (60-65% by 05/2018 TTE); d.) 02/2021 MV: small Apical inf fixed defect. No isc -> low risk.   Cardiomegaly    Cellulitis and abscess of left leg 07/22/2020   Chronic atrial fibrillation    a.) CHA2DS2VASc = 4 (CHF, HTN, vascular disease history, T2DM);  b.) rate/rhythm maintained without pharmacological intervention; chronically anticoagulated with apixaban   Chronic combined systolic and diastolic CHF (congestive heart failure)    a.) TTE 7/18 : EF 45-50%; b.) TTE 05/2018 : EF 60-65%, RVSP 74.8; c.) TTE 12/2018: EF 50-55%. Sev dil LA; d.) TTE 04/2019: EF 45-50%; e.) TTE 07/2021: EF 40-45%, G3DD; f.) TTE 10/2021: EF 40-45%, mild conc LVH, septal-lat dyssynchrony (LBBB), mildly red RVSF, mild-mod dil LA, mildly dil RA, mild-mod MS, mod AS; g.) TTE 04/02/2022: EF 40-45%, glob HK, LVH, red RVSF, RVE, sev LAE, mild-mod RAE, mild MR   Chronic venous stasis dermatitis of both lower extremities    Cytomegaloviral disease 2017   Diverticulosis of colon 04/22/2013   Elevated RIGHT hemidiaphragm    Erectile dysfunction    a.) on topical  TRT + Trimix (alprostadil/papaverine/phentolamine) injections   ESRD (end stage renal disease) (HCC)    a.) dialysis dependent 2004-2012; b.) s/p cadaveric RIGHT renal transplant 09/13/2010   Essential hypertension 12/17/2010   GERD (gastroesophageal reflux disease)    Gout    History of 2019 novel coronavirus disease (COVID-19) 06/30/2019   History of bilateral cataract extraction 10/2017   History of renal transplant 09/13/2010   a.) s/p cadaveric donor transplant 09/13/2010   Hx of bilateral cataract extraction 10/2017   Hyperlipidemia    Hypogonadism in male  09/25/2014   Ischemic cardiomyopathy    Left cephalic vein thrombosis 06/2019   Long term current use of anticoagulant    a.) apixaban   Long term current use of immunosuppressive drug    a.) mycophenolate + prednisone + tacrolimus   Major depressive disorder 10/04/2013   Mitral stenosis 11/07/2021   a.) TTE 11/07/2021: severe MAC, mild-mod MS (MPG 6 mmHg); b.) TTE 04/02/2022: no MV stenosis   Murmur    Obesity hypoventilation syndrome 03/31/2012   OSA treated with BiPAP 09/29/2012   PAH (pulmonary artery hypertension)    a. 04/2019 RHC: RA 19, RV 80/20, PA 78/31 (51), PCWP 25, CO/CI 7.69/3.26. PVR 3.25 --> Sev PAH, likely primarily PV HTN; b.) RHC 04/04/2022: mRA 13, mPA 40, mPCWP 20, PA sat 59, AO sat 92, CO 7.63, CI 3.26, PVR 2.6 --> mod portal venous HTN   Pancreatitis    S/P CABG x 4 08/13/2002   a.) LIMA-LAD, LRA-OM, SVG-D1, SVG-dRCA   S/P gastric bypass    Synovitis of finger 06/11/2019   Trigger finger, unspecified little finger 12/23/2018   Type 2 diabetes mellitus with hyperglycemia, with long-term current use of insulin 09/09/2014   a.) has FreeStyle Libre CGM   Type 2 MI (myocardial infarction) (HCC)    Ulcer 05/2016   Left shin   Wears glasses      Past Surgical History:  Procedure Laterality Date   CARDIAC CATHETERIZATION N/A 08/10/2002   CATARACT EXTRACTION W/PHACO Right 10/29/2017   Procedure: CATARACT EXTRACTION PHACO AND INTRAOCULAR LENS PLACEMENT (IOC)  RIGHT DIABETIC;  Surgeon: Lockie Mola, MD;  Location: [facility];  Service: Ophthalmology;  Laterality: Right   CATARACT EXTRACTION W/PHACO Left 11/18/2017   Procedure: CATARACT EXTRACTION PHACO AND INTRAOCULAR LENS PLACEMENT (IOC) LEFT IVA/TOPICAL;  Surgeon: Lockie Mola, MD;  Location: [facility];  Service: Ophthalmology;  Laterality: Left   COLONOSCOPY WITH PROPOFOL N/A 04/22/2013   Procedure: COLONOSCOPY WITH PROPOFOL;  Surgeon: Rachael Fee, MD;  Location: [facility];   Service: Endoscopy;  Laterality: N/A;   CORONARY ANGIOPLASTY WITH STENT PLACEMENT N/A 1996   CORONARY ARTERY BYPASS GRAFT N/A 08/13/2002   Procedure: 4v CORONARY ARTERY BYPASS GRAFTING; Location: [facility]; Surgeon: Katy Apo, MD   CYSTOSCOPY WITH INSERTION OF UROLIFT N/A 12/25/2021   Procedure: CYSTOSCOPY WITH INSERTION OF UROLIFT;  Surgeon: Riki Altes, MD;  Location: [facility];  Service: Urology;  Laterality: N/A;   DG ANGIO AV SHUNT*L*     right and left upper arms   FASCIOTOMY  03/03/2012   Procedure: FASCIOTOMY;  Surgeon: Nicki Reaper, MD;  Location: [facility];  Service: Orthopedics;  Laterality: Right;  FASCIOTOMY RIGHT SMALL FINGER   FASCIOTOMY Left 08/17/2013   Procedure: FASCIOTOMY LEFT RING;  Surgeon: Nicki Reaper, MD;  Location: [facility];  Service: Orthopedics;  Laterality: Left;   INCISION AND DRAINAGE ABSCESS Left 10/15/2015   Procedure: INCISION AND DRAINAGE ABSCESS;  Surgeon: Leafy Ro, MD;  Location: [facility];  Service: General;  Laterality: Left;   KIDNEY TRANSPLANT  09/13/2010   cadaver--at [facility] HEART CATH N/A 11/15/2016   Procedure: RIGHT HEART CATH;  Surgeon: Iran Ouch, MD;  Location: [facility];  Service: Cardiovascular;  Laterality: N/A;   RIGHT HEART CATH N/A 05/03/2019   Procedure: RIGHT HEART CATH;  Surgeon: Laurey Morale, MD;  Location: [facility] IN[facility] CV [facility];  Service: Cardiovascular;  Laterality: N/A;   RIGHT HEART CATH N/A 04/04/2022   Procedure: RIGHT HEART CATH;  Surgeon: Laurey Morale, MD;  Location: [facility] IN[facility] CV LAB;  Service: Cardiovascular;  Laterality: N/A;   ROUX-EN-Y GASTRIC BYPASS N/A 2015   TYMPANIC MEMBRANE REPAIR Left 03/2010   VASECTOMY     WART FULGURATION N/A 05/31/2022   Procedure: FULGURATION ANAL WART;  Surgeon: Sung Amabile, DO;  Location: [facility];  Service: General;  Laterality: N/A;    Social History   Socioeconomic History   Marital status: Married     Spouse name: Not on file   Number of children: 2   Years of education: 14   Highest education level: Associate degree: academic program  Occupational History   Occupation: Presenter, broadcasting: unemployed    Comment: disabled due to kidney failure  Tobacco Use   Smoking status: Former    Types: Cigars    Quit date: 09/02/1994    Years since quitting: 27.9    Passive exposure: Never   Smokeless tobacco: Never   Tobacco comments:    Occasional, rare cigars  Vaping Use   Vaping Use: Never used  Substance and Sexual Activity   Alcohol use: Not Currently    Comment: occ   Drug use: No   Sexual activity: Not on file  Other Topics Concern   Not on file  Social History Narrative   Has living will   Wife is health care POA   Would accept resuscitation attempts   No tube feedings if cognitively unaware   Social Determinants of Health   Financial Resource Strain: Not on file  Food Insecurity: No Food Insecurity (07/07/2022)   Hunger Vital Sign    Worried About Running Out of Food in the Last Year: Never true    Ran Out of Food in the Last Year: Never true  Transportation Needs: No Transportation Needs (07/07/2022)   PRAPARE - Administrator, Civil Service (Medical): No    Lack of Transportation (Non-Medical): No  Physical Activity: Not on file  Stress: Not on file  Social Connections: Not on file  Intimate Partner Violence: Not At Risk (07/07/2022)   Humiliation, Afraid, Rape, and Kick questionnaire    Fear of Current or Ex-Partner: No    Emotionally Abused: No    Physically Abused: No    Sexually Abused: No     Family History  Problem Relation Age of Onset   Heart disease Father    Kidney failure Father    Kidney disease Father    Diabetes Maternal Grandmother    Breast cancer Maternal Grandmother    Valvular heart disease Mother    Liver cancer Paternal Uncle    Liver cancer Paternal Grandmother    Prostate cancer Neg Hx       Current Outpatient Medications:    albuterol (PROVENTIL) (2.5 MG/3ML) 0.083% nebulizer solution, Take 3 mLs (2.5 mg total) by nebulization every 6 (six) hours as needed for wheezing  or shortness of breath., Disp: 150 mL, Rfl: 0   albuterol (VENTOLIN HFA) 108 (90 Base) MCG/ACT inhaler, Inhale 2 puffs into the lungs every 6 (six) hours as needed for wheezing or shortness of breath., Disp: 6.7 g, Rfl: 5   AMBULATORY NON FORMULARY MEDICATION, Trimix (30/1/10)-(Pap/Phent/PGE)  Test Dose  1ml vial   Qty #3 Refills 0  [facility] [phone] Fax [phone], Disp: 3 mL, Rfl: 0   apixaban (ELIQUIS) 5 MG TABS tablet, Take 1 tablet (5 mg total) by mouth 2 (two) times daily. Hold till 07/10/2022 and restart on 07/11/22, Disp: 180 tablet, Rfl: 1   buPROPion (WELLBUTRIN SR) 150 MG 12 hr tablet, Take 1 tablet (150 mg total) by mouth 2 (two) times daily., Disp: 180 tablet, Rfl: 3   cholecalciferol (VITAMIN D3) 25 MCG (1000 UNIT) tablet, Take 1,000 Units by mouth at bedtime., Disp: , Rfl:    colchicine 0.6 MG tablet, Take 1 tablet (0.6 mg total) by mouth every other day., Disp: 45 tablet, Rfl: 1   Continuous Glucose Sensor (FREESTYLE LIBRE 3 SENSOR) MISC, Use one every 2 weeks., Disp: 6 each, Rfl: 3   cyanocobalamin 100 MCG tablet, Take 100 mcg by mouth daily., Disp: , Rfl:    cyclobenzaprine (FLEXERIL) 5 MG tablet, Take by mouth., Disp: , Rfl:    dapagliflozin propanediol (FARXIGA) 10 MG TABS tablet, Take 1 tablet (10 mg total) by mouth daily before breakfast., Disp: 90 tablet, Rfl: 3   febuxostat (ULORIC) 40 MG tablet, Take 1 tablet (40 mg total) by mouth daily., Disp: 90 tablet, Rfl: 5   fluticasone (FLONASE) 50 MCG/ACT nasal spray, PLACE 2 SPRAYS INTO BOTH NOSTRILS DAILY., Disp: 16 g, Rfl: 11   gabapentin (NEURONTIN) 300 MG capsule, Take 1 capsule (300 mg total) by mouth 2 (two) times daily., Disp: 180 capsule, Rfl: 3   hydrALAZINE (APRESOLINE) 25 MG tablet, Take 1 tablet (25 mg total) by mouth  every 8 (eight) hours., Disp: 90 tablet, Rfl: 1   hydrALAZINE (APRESOLINE) 25 MG tablet, Take by mouth., Disp: , Rfl:    HYDROcodone-acetaminophen (NORCO/VICODIN) 5-325 MG tablet, Take 1-2 tablets by mouth every 6 (six) hours as needed for moderate pain., Disp: 40 tablet, Rfl: 0   hydrocortisone 2.5 % cream, Apply topically 3 (three) times daily as needed., Disp: 30 g, Rfl: 3   insulin glargine-yfgn (SEMGLEE) 100 UNIT/ML Pen, Inject 40 Units into the skin daily. (Patient taking differently: Inject 35 Units into the skin daily.), Disp: 45 mL, Rfl: 3   insulin lispro (HUMALOG KWIKPEN) 100 UNIT/ML KwikPen, Inject 10-20 Units into the skin 3 (three) times daily. Per sliding scale, Disp: , Rfl:    Insulin Pen Needle (UNIFINE PENTIPS) 31G X 5 MM MISC, Use 4 times daily as directed, Disp: 400 each, Rfl: 3   iron polysaccharides (FERREX 150) 150 MG capsule, Take 1 capsule (150 mg total) by mouth 2 (two) times daily., Disp: 180 capsule, Rfl: 0   isosorbide mononitrate (IMDUR) 30 MG 24 hr tablet, Take 1 tablet (30 mg total) by mouth daily., Disp: 90 tablet, Rfl: 3   lidocaine (LIDODERM) 5 %, Place 1 patch onto the skin daily.PLACE 1 PATCH ONTO THE SKIN FOR 12 (TWELVE) HOURS. REMOVE & DISCARD PATCH WITHIN 12 HOURS OR AS DIRECTED BY MD (Patient taking differently: Place 1 patch onto the skin daily as needed.), Disp: 60 patch, Rfl: 11   lidocaine (XYLOCAINE) 5 % ointment, Apply 1 Application topically 3 (three) times daily as needed. Apply pea size  amount to area as needed for pain., Disp: 50 g, Rfl: 0   losartan (COZAAR) 25 MG tablet, Take 1 tablet (25 mg total) by mouth daily., Disp: 90 tablet, Rfl: 3   metolazone (ZAROXOLYN) 2.5 MG tablet, Take 1 tablet by mouth 3 times weekly., Disp: 15 tablet, Rfl: 3   montelukast (SINGULAIR) 10 MG tablet, TAKE 1 TABLET BY MOUTH AT BEDTIME. (Patient taking differently: Take 10 mg by mouth at bedtime.), Disp: 90 tablet, Rfl: 3   Multiple Vitamin (MULTIVITAMIN WITH MINERALS)  TABS tablet, Take 1 tablet by mouth daily., Disp: , Rfl:    mycophenolate (MYFORTIC) 180 MG EC tablet, Take 2 tablets (360 mg total) by mouth 2 (two) times daily., Disp: 360 tablet, Rfl: 3   omeprazole (PRILOSEC) 20 MG capsule, TAKE 1 CAPSULE BY MOUTH DAILY. (Patient taking differently: Take 20 mg by mouth at bedtime.), Disp: 90 capsule, Rfl: 3   predniSONE (DELTASONE) 5 MG tablet, Take 1 tablet (5 mg total) by mouth daily., Disp: 90 tablet, Rfl: 3   rosuvastatin (CRESTOR) 10 MG tablet, Take 1 tablet (10 mg total) by mouth daily. (Patient taking differently: Take 10 mg by mouth at bedtime.), Disp: 90 tablet, Rfl: 3   sulfamethoxazole-trimethoprim (BACTRIM) 400-80 MG tablet, Take 1 tablet by mouth 3 (three) times a week every Monday, Wednesday, and Friday., Disp: 36 tablet, Rfl: 3   tacrolimus (PROGRAF) 1 MG capsule, Take 2 capsules (2 mg total) by mouth 2 times daily., Disp: 120 capsule, Rfl: 11   tamsulosin (FLOMAX) 0.4 MG CAPS capsule, Take 1 capsule (0.4 mg total) by mouth daily., Disp: 90 capsule, Rfl: 3   Testosterone 10 MG/ACT (2%) GEL, Apply 1 Pump topically daily to inner thighs - 2 pumps total., Disp: 60 g, Rfl: 0   torsemide (DEMADEX) 100 MG tablet, Take 1 tablet (100 mg total) by mouth 2 (two) times daily., Disp: 60 tablet, Rfl: 5   zinc sulfate 220 (50 Zn) MG capsule, Take 220 mg by mouth daily., Disp: , Rfl:    LUTEIN PO, Take 1 tablet by mouth daily., Disp: , Rfl:    spironolactone (ALDACTONE) 25 MG tablet, Take by mouth., Disp: , Rfl:    traMADol (ULTRAM) 50 MG tablet, Take by mouth., Disp: , Rfl:    Physical exam:  Vitals:   08/05/22 1037 08/05/22 1044  BP: (!) 158/86 (!) 140/62  Pulse: 98 71  Resp: 18   Temp: (!) 95.9 F (35.5 C)   TempSrc: Tympanic   SpO2: 100%   Weight: 265 lb 8 oz (120.4 kg)   Height: 5\' 10"  (1.778 m)    Physical Exam Cardiovascular:     Rate and Rhythm: Normal rate and regular rhythm.     Heart sounds: Normal heart sounds.  Pulmonary:      Effort: Pulmonary effort is normal.     Breath sounds: Normal breath sounds.  Abdominal:     General: Bowel sounds are normal.     Palpations: Abdomen is soft.  Skin:    General: Skin is warm and dry.  Neurological:     Mental Status: He is alert and oriented to person, place, and time.           Latest Ref Rng & Units 07/29/2022   12:26 PM  CMP  Glucose 70 - 99 mg/dL 161   BUN 8 - 23 mg/dL 72   Creatinine 0.96 - 1.24 mg/dL 0.45   Sodium 409 - 811 mmol/L 142   Potassium 3.5 - 5.1  mmol/L 3.8   Chloride 98 - 111 mmol/L 98   CO2 22 - 32 mmol/L 34   Calcium 8.9 - 10.3 mg/dL 9.4       Latest Ref Rng & Units 07/08/2022    6:33 AM  CBC  WBC 4.0 - 10.5 K/uL 5.0   Hemoglobin 13.0 - 17.0 g/dL 16.1   Hematocrit 09.6 - 52.0 % 37.0   Platelets 150 - 400 K/uL 150     No images are attached to the encounter.  DG HIP UNILAT WITH PELVIS 2-3 VIEWS RIGHT  Result Date: 07/19/2022 CLINICAL DATA:  Right hip pain after fall EXAM: DG HIP (WITH OR WITHOUT PELVIS) 2-3V RIGHT COMPARISON:  None Available. FINDINGS: Small fiducials or clips project over the prostate gland and right hip. No cortical discontinuity to indicate fracture. Vascular calcifications noted. IMPRESSION: 1. No fracture identified. If pain persists despite conservative therapy, MRI may be warranted for further characterization. Electronically Signed   By: Gaylyn Rong M.D.   On: 07/19/2022 17:42   DG FEMUR, MIN 2 VIEWS RIGHT  Result Date: 07/19/2022 CLINICAL DATA:  Right thigh pain following a fall EXAM: RIGHT FEMUR 2 VIEWS COMPARISON:  None Available. FINDINGS: No fracture or acute bony findings. Osteoarthritis of the right knee. Atherosclerotic vascular calcifications. IMPRESSION: 1. No acute findings. 2. Osteoarthritis of the right knee. 3. Atherosclerotic vascular calcifications. Electronically Signed   By: Gaylyn Rong M.D.   On: 07/19/2022 17:41   US Venous Img Lower Unilateral Right  Result Date:  07/19/2022 CLINICAL DATA:  Right lower extremity edema and pain for 2 weeks EXAM: RIGHT LOWER EXTREMITY VENOUS DOPPLER ULTRASOUND TECHNIQUE: Gray-scale sonography with compression, as well as color and duplex ultrasound, were performed to evaluate the deep venous system(s) from the level of the common femoral vein through the popliteal and proximal calf veins. COMPARISON:  None Available. FINDINGS: VENOUS Normal compressibility of the common femoral, superficial femoral, and popliteal veins, as well as the visualized calf veins. Visualized portions of profunda femoral vein and great saphenous vein unremarkable. No filling defects to suggest DVT on grayscale or color Doppler imaging. Doppler waveforms show normal direction of venous flow, normal respiratory plasticity and response to augmentation. Limited views of the contralateral common femoral vein are unremarkable. OTHER 4.9 by 1.5 by 3.8 cm (volume = 15 cm^3) subcutaneous collection with some interspersed adipose tissue in the subcutaneous tissues along the right hip region adjacent to the superficial fascia margin. Norma Fredrickson lesion not excluded. Limitations: none IMPRESSION: 1. No evidence of deep venous thrombosis in the right lower extremity. 2. 4.9 x 1.5 x 3.8 cm subcutaneous collection with some interspersed adipose tissue in the subcutaneous tissues along the right hip region adjacent to the superficial fascia margin, probably a hematoma. Norma Fredrickson lesion not excluded. Electronically Signed   By: Gaylyn Rong M.D.   On: 07/19/2022 17:33   DG Chest 2 View  Result Date: 07/08/2022 CLINICAL DATA:  62 year old male history of rib fractures. EXAM: CHEST - 2 VIEW COMPARISON:  Chest x-ray 07/06/2022. FINDINGS: Lung volumes are normal. No consolidative airspace disease. No pleural effusions. No pneumothorax. No pulmonary nodule or mass noted. Pulmonary vasculature is mildly engorged, without frank pulmonary edema. Heart size is mildly enlarged.  Calcified mediastinal lymph nodes are again noted. Upper mediastinal contours are within normal limits. Status post median sternotomy for CABG. Multiple known nondisplaced right third through fifth rib fractures are not well demonstrated on today's plain film examination. IMPRESSION: 1. Known nondisplaced right third  through fifth rib fractures not well demonstrated by plain film examination. 2. Cardiomegaly with pulmonary venous congestion, but no frank pulmonary edema. Electronically Signed   By: Trudie Reed M.D.   On: 07/08/2022 07:33   CT Cervical Spine Wo Contrast  Result Date: 07/07/2022 CLINICAL DATA:  Trauma/fall EXAM: CT CERVICAL SPINE WITHOUT CONTRAST TECHNIQUE: Multidetector CT imaging of the cervical spine was performed without intravenous contrast. Multiplanar CT image reconstructions were also generated. RADIATION DOSE REDUCTION: This exam was performed according to the departmental dose-optimization program which includes automated exposure control, adjustment of the mA and/or kV according to patient size and/or use of iterative reconstruction technique. COMPARISON:  None Available. FINDINGS: Alignment: Normal cervical lordosis. Skull base and vertebrae: No acute fracture. No primary bone lesion or focal pathologic process. Soft tissues and spinal canal: No prevertebral fluid or swelling. No visible canal hematoma. Disc levels: Right paracentral disc osteophyte complexes C3-4 (series 3/image 51) and diffuse posterior disc osteophyte complex at C4-5 (series 3/image 89), with resultant severe spinal canal stenosis, cord flattening, and severe bilateral neural foraminal narrowing. Upper chest: Visualized lung apices are clear. Other: Visualized thyroid is unremarkable. IMPRESSION: No traumatic injury to the cervical spine. Disc osteophyte complexes with severe spinal canal stenosis and bilateral neural foraminal narrowing at C3-4 and C4-5, as above. Consider follow-up MR cervical spine without  contrast for further evaluation, as clinically warranted. Electronically Signed   By: Charline Bills M.D.   On: 07/07/2022 01:11   CT Chest Wo Contrast  Result Date: 07/07/2022 CLINICAL DATA:  Fall, right chest pain EXAM: CT CHEST WITHOUT CONTRAST TECHNIQUE: Multidetector CT imaging of the chest was performed following the standard protocol without IV contrast. RADIATION DOSE REDUCTION: This exam was performed according to the departmental dose-optimization program which includes automated exposure control, adjustment of the mA and/or kV according to patient size and/or use of iterative reconstruction technique. COMPARISON:  06/21/2022 FINDINGS: Cardiovascular: Cardiomegaly.  No pericardial effusion. No evidence of thoracic aortic aneurysm. Atherosclerotic calcifications of the aortic arch. Severe three-vessel coronary atherosclerosis. Postsurgical changes related to prior CABG. Mediastinum/Nodes: Densely calcified 2.6 cm short axis right paratracheal node (series 1/image 62), chronic. Small calcified right perihilar nodes, chronic. No suspicious mediastinal lymphadenopathy. Visualized thyroid is unremarkable. Lungs/Pleura: Scattered calcified granulomata. No suspicious pulmonary nodules. Faint ground-glass peribronchovascular opacity in the inferolateral right upper lobe (series 3/image 86), suggesting mild pulmonary contusion in this clinical setting. No focal consolidation. No pleural effusion or pneumothorax. Upper Abdomen: Visualized upper abdomen is notable for a mildly nodular hepatic contour, mild splenomegaly, sleeve gastrectomy, and extensive vascular calcifications. Musculoskeletal: Nondisplaced right lateral 3rd through 5th rib fractures. Sternum, clavicles, and scapulae are intact. Degenerative changes of the visualized thoracolumbar spine. IMPRESSION: Nondisplaced right lateral 3rd through 5th rib fractures. No pneumothorax. Faint ground-glass opacity in the inferolateral right upper lobe,  suggesting mild pulmonary contusion in this clinical setting. Additional ancillary findings as above. Aortic Atherosclerosis (ICD10-I70.0). Electronically Signed   By: Charline Bills M.D.   On: 07/07/2022 01:01   CT HEAD WO CONTRAST  Result Date: 07/06/2022 CLINICAL DATA:  Head trauma, minor, normal mental status (Age 74-64y) on blood thinner EXAM: CT HEAD WITHOUT CONTRAST TECHNIQUE: Contiguous axial images were obtained from the base of the skull through the vertex without intravenous contrast. RADIATION DOSE REDUCTION: This exam was performed according to the departmental dose-optimization program which includes automated exposure control, adjustment of the mA and/or kV according to patient size and/or use of iterative reconstruction technique. COMPARISON:  CT head  06/21/2022 FINDINGS: Brain: Patchy and confluent areas of decreased attenuation are noted throughout the deep and periventricular white matter of the cerebral hemispheres bilaterally, compatible with chronic microvascular ischemic disease. No evidence of large-territorial acute infarction. No parenchymal hemorrhage. No mass lesion. No extra-axial collection. No mass effect or midline shift. No hydrocephalus. Basilar cisterns are patent. Vascular: No hyperdense vessel. Atherosclerotic calcifications are present within the cavernous internal carotid and vertebral arteries. Skull: No acute fracture or focal lesion. Sinuses/Orbits: Paranasal sinuses and mastoid air cells are clear. Bilateral lens replacement. Otherwise the orbits are unremarkable. Other: None. IMPRESSION: No acute intracranial abnormality. Electronically Signed   By: Tish Frederickson M.D.   On: 07/06/2022 21:09   DG Chest 1 View  Result Date: 07/06/2022 CLINICAL DATA:  [id number] Rib pain [id number] EXAM: CHEST  1 VIEW COMPARISON:  Chest x-ray 06/22/2022, CT chest 06/21/2022 FINDINGS: The heart and mediastinal contours are unchanged. Vascular clips overlie the mediastinum. Persistent  prominent hilar vasculature. No focal consolidation. Chronic coarsened interstitial markings. No pleural effusion. No pneumothorax. No acute osseous abnormality.  Sternotomy wires are intact. IMPRESSION: Low lung volumes with pulmonary venous congestion. Electronically Signed   By: Tish Frederickson M.D.   On: 07/06/2022 21:05    Assessment and plan- Patient is a 62 y.o. male referred for anemia of chronic kidney disease  Doing a complete anemia workup today including CBC with differential, CMP, ferritin and iron studies B12 folate and TSH as well as myeloma panel and serum free light chains.  I do not have any of his recent iron studies.  If ferritin is less than 100 and or iron saturation less than 20% we will plan to give him IV iron here.  If his hemoglobin drops down to less than 10 we will also plan to give him EPO here.   Thank you for this kind referral and the opportunity to participate in the care of this patient   Visit Diagnosis 1. Normocytic anemia     Dr. Owens Shark, MD, MPH [facility] [phone] 08/05/2022

## 2022-08-06 ENCOUNTER — Ambulatory Visit (INDEPENDENT_AMBULATORY_CARE_PROVIDER_SITE_OTHER): Payer: Commercial Managed Care - PPO | Admitting: Internal Medicine

## 2022-08-06 ENCOUNTER — Encounter: Payer: Self-pay | Admitting: Internal Medicine

## 2022-08-06 VITALS — BP 138/80 | HR 76 | Temp 97.7°F | Ht 69.0 in | Wt 263.2 lb

## 2022-08-06 DIAGNOSIS — R197 Diarrhea, unspecified: Secondary | ICD-10-CM

## 2022-08-06 LAB — KAPPA/LAMBDA LIGHT CHAINS
Kappa free light chain: 94.8 mg/L — ABNORMAL HIGH (ref 3.3–19.4)
Kappa, lambda light chain ratio: 0.64 (ref 0.26–1.65)
Lambda free light chains: 147 mg/L — ABNORMAL HIGH (ref 5.7–26.3)

## 2022-08-06 LAB — HAPTOGLOBIN: Haptoglobin: 132 mg/dL (ref 32–363)

## 2022-08-06 NOTE — Addendum Note (Signed)
Addended by: Alvina Chou on: 08/06/2022 12:42 PM   Modules accepted: Orders

## 2022-08-06 NOTE — Assessment & Plan Note (Signed)
Did have recent Campylobacter infection Will check stool PCR again No antibiotic unless positive May need GI evaluation

## 2022-08-06 NOTE — Progress Notes (Signed)
Subjective:    Patient ID: Carl Chayan Duskey., male    DOB: 08/20/1960, 62 y.o.   MRN: [id number]  HPI Here due to diarrhea  Started 4 days ago Loose watery stool every time he eats---just 3 times a day No abdominal pain---just grumbes---?slight discomfort in lower abdomen No blood in stool  Did have Campylobacter diarrhea in the hospital Was treated there  Current Outpatient Medications on File Prior to Visit  Medication Sig Dispense Refill   albuterol (PROVENTIL) (2.5 MG/3ML) 0.083% nebulizer solution Take 3 mLs (2.5 mg total) by nebulization every 6 (six) hours as needed for wheezing or shortness of breath. 150 mL 0   albuterol (VENTOLIN HFA) 108 (90 Base) MCG/ACT inhaler Inhale 2 puffs into the lungs every 6 (six) hours as needed for wheezing or shortness of breath. 6.7 g 5   AMBULATORY NON FORMULARY MEDICATION Trimix (30/1/10)-(Pap/Phent/PGE)  Test Dose  1ml vial   Qty #3 Refills 0  [facility] [phone] Fax [phone] 3 mL 0   apixaban (ELIQUIS) 5 MG TABS tablet Take 1 tablet (5 mg total) by mouth 2 (two) times daily. Hold till 07/10/2022 and restart on 07/11/22 180 tablet 1   buPROPion (WELLBUTRIN SR) 150 MG 12 hr tablet Take 1 tablet (150 mg total) by mouth 2 (two) times daily. 180 tablet 3   cholecalciferol (VITAMIN D3) 25 MCG (1000 UNIT) tablet Take 1,000 Units by mouth at bedtime.     colchicine 0.6 MG tablet Take 1 tablet (0.6 mg total) by mouth every other day. 45 tablet 1   Continuous Glucose Sensor (FREESTYLE LIBRE 3 SENSOR) MISC Use one every 2 weeks. 6 each 3   cyanocobalamin 100 MCG tablet Take 100 mcg by mouth daily.     cyclobenzaprine (FLEXERIL) 5 MG tablet Take by mouth.     dapagliflozin propanediol (FARXIGA) 10 MG TABS tablet Take 1 tablet (10 mg total) by mouth daily before breakfast. 90 tablet 3   febuxostat (ULORIC) 40 MG tablet Take 1 tablet (40 mg total) by mouth daily. 90 tablet 5   fluticasone (FLONASE) 50 MCG/ACT nasal spray  PLACE 2 SPRAYS INTO BOTH NOSTRILS DAILY. 16 g 11   gabapentin (NEURONTIN) 300 MG capsule Take 1 capsule (300 mg total) by mouth 2 (two) times daily. 180 capsule 3   hydrALAZINE (APRESOLINE) 25 MG tablet Take 1 tablet (25 mg total) by mouth every 8 (eight) hours. 90 tablet 1   HYDROcodone-acetaminophen (NORCO/VICODIN) 5-325 MG tablet Take 1-2 tablets by mouth every 6 (six) hours as needed for moderate pain. 40 tablet 0   hydrocortisone 2.5 % cream Apply topically 3 (three) times daily as needed. 30 g 3   insulin glargine-yfgn (SEMGLEE) 100 UNIT/ML Pen Inject 40 Units into the skin daily. (Patient taking differently: Inject 35 Units into the skin daily.) 45 mL 3   insulin lispro (HUMALOG KWIKPEN) 100 UNIT/ML KwikPen Inject 10-20 Units into the skin 3 (three) times daily. Per sliding scale     Insulin Pen Needle (UNIFINE PENTIPS) 31G X 5 MM MISC Use 4 times daily as directed 400 each 3   iron polysaccharides (FERREX 150) 150 MG capsule Take 1 capsule (150 mg total) by mouth 2 (two) times daily. 180 capsule 0   isosorbide mononitrate (IMDUR) 30 MG 24 hr tablet Take 1 tablet (30 mg total) by mouth daily. 90 tablet 3   lidocaine (LIDODERM) 5 % Place 1 patch onto the skin daily.PLACE 1 PATCH ONTO THE SKIN FOR 12 (TWELVE) HOURS.  REMOVE & DISCARD PATCH WITHIN 12 HOURS OR AS DIRECTED BY MD (Patient taking differently: Place 1 patch onto the skin daily as needed.) 60 patch 11   lidocaine (XYLOCAINE) 5 % ointment Apply 1 Application topically 3 (three) times daily as needed. Apply pea size amount to area as needed for pain. 50 g 0   losartan (COZAAR) 25 MG tablet Take 1 tablet (25 mg total) by mouth daily. 90 tablet 3   LUTEIN PO Take 1 tablet by mouth daily.     metolazone (ZAROXOLYN) 2.5 MG tablet Take 1 tablet by mouth 3 times weekly. 15 tablet 3   montelukast (SINGULAIR) 10 MG tablet TAKE 1 TABLET BY MOUTH AT BEDTIME. (Patient taking differently: Take 10 mg by mouth at bedtime.) 90 tablet 3   Multiple  Vitamin (MULTIVITAMIN WITH MINERALS) TABS tablet Take 1 tablet by mouth daily.     mycophenolate (MYFORTIC) 180 MG EC tablet Take 2 tablets (360 mg total) by mouth 2 (two) times daily. 360 tablet 3   omeprazole (PRILOSEC) 20 MG capsule TAKE 1 CAPSULE BY MOUTH DAILY. (Patient taking differently: Take 20 mg by mouth at bedtime.) 90 capsule 3   predniSONE (DELTASONE) 5 MG tablet Take 1 tablet (5 mg total) by mouth daily. 90 tablet 3   rosuvastatin (CRESTOR) 10 MG tablet Take 1 tablet (10 mg total) by mouth daily. (Patient taking differently: Take 10 mg by mouth at bedtime.) 90 tablet 3   spironolactone (ALDACTONE) 25 MG tablet Take by mouth.     sulfamethoxazole-trimethoprim (BACTRIM) 400-80 MG tablet Take 1 tablet by mouth 3 (three) times a week every Monday, Wednesday, and Friday. 36 tablet 3   tacrolimus (PROGRAF) 1 MG capsule Take 2 capsules (2 mg total) by mouth 2 times daily. 120 capsule 11   tamsulosin (FLOMAX) 0.4 MG CAPS capsule Take 1 capsule (0.4 mg total) by mouth daily. 90 capsule 3   Testosterone 10 MG/ACT (2%) GEL Apply 1 Pump topically daily to inner thighs - 2 pumps total. 60 g 0   torsemide (DEMADEX) 100 MG tablet Take 1 tablet (100 mg total) by mouth 2 (two) times daily. 60 tablet 5   traMADol (ULTRAM) 50 MG tablet Take by mouth.     zinc sulfate 220 (50 Zn) MG capsule Take 220 mg by mouth daily.     No current facility-administered medications on file prior to visit.    Allergies  Allergen Reactions   Contrast Media [Iodinated Contrast Media] Other (See Comments)    Kidney transplant   Iodine Other (See Comments)    Other reaction(s): Other (See Comments) Kidney transplant   Ibuprofen Other (See Comments)    Due to kidney transplant Due to kidney transplant Due to kidney transplant    Past Medical History:  Diagnosis Date   Anal condyloma    Anemia    Aortic atherosclerosis (HCC)    Aortic stenosis    a.) TTE 10/2021: Mod AS. AVA 1.06cm^2 (VTI). Mean grad ;  b.) TTE 04/02/2022: no AV stenosis   Asthma, persistent controlled 02/25/2013   Attention or concentration deficit 04/26/2021   BPH with obstruction/lower urinary tract symptoms 09/25/2014   CAD (coronary artery disease) 2005   a.) LHC 1996 -> HG stenosis mLAD -> PTCA/PCI with stent x 1 (unk type) -> complicated by CFA pseudoanurysm (required surg);  b.) LHC 08/10/2002  -> 95% LAD, 70% ISR LAD, 80% pOM, 70% mRCA --> CVTS consult; c.) 4v CABG 08/13/2002; c.) 03/2018 MV: Small, fixed inferoapical and  apical sep defect. No isc. EF 47% (60-65% by 05/2018 TTE); d.) 02/2021 MV: small Apical inf fixed defect. No isc -> low risk.   Cardiomegaly    Cellulitis and abscess of left leg 07/22/2020   Chronic atrial fibrillation    a.) CHA2DS2VASc = 4 (CHF, HTN, vascular disease history, T2DM);  b.) rate/rhythm maintained without pharmacological intervention; chronically anticoagulated with apixaban   Chronic combined systolic and diastolic CHF (congestive heart failure)    a.) TTE 7/18 : EF 45-50%; b.) TTE 05/2018 : EF 60-65%, RVSP 74.8; c.) TTE 12/2018: EF 50-55%. Sev dil LA; d.) TTE 04/2019: EF 45-50%; e.) TTE 07/2021: EF 40-45%, G3DD; f.) TTE 10/2021: EF 40-45%, mild conc LVH, septal-lat dyssynchrony (LBBB), mildly red RVSF, mild-mod dil LA, mildly dil RA, mild-mod MS, mod AS; g.) TTE 04/02/2022: EF 40-45%, glob HK, LVH, red RVSF, RVE, sev LAE, mild-mod RAE, mild MR   Chronic venous stasis dermatitis of both lower extremities    Cytomegaloviral disease 2017   Diverticulosis of colon 04/22/2013   Elevated RIGHT hemidiaphragm    Erectile dysfunction    a.) on topical TRT + Trimix (alprostadil/papaverine/phentolamine) injections   ESRD (end stage renal disease) (HCC)    a.) dialysis dependent 2004-2012; b.) s/p cadaveric RIGHT renal transplant 09/13/2010   Essential hypertension 12/17/2010   GERD (gastroesophageal reflux disease)    Gout    History of 2019 novel coronavirus disease (COVID-19) 06/30/2019    History of bilateral cataract extraction 10/2017   History of renal transplant 09/13/2010   a.) s/p cadaveric donor transplant 09/13/2010   Hx of bilateral cataract extraction 10/2017   Hyperlipidemia    Hypogonadism in male 09/25/2014   Ischemic cardiomyopathy    Left cephalic vein thrombosis 06/2019   Long term current use of anticoagulant    a.) apixaban   Long term current use of immunosuppressive drug    a.) mycophenolate + prednisone + tacrolimus   Major depressive disorder 10/04/2013   Mitral stenosis 11/07/2021   a.) TTE 11/07/2021: severe MAC, mild-mod MS (MPG 6 mmHg); b.) TTE 04/02/2022: no MV stenosis   Murmur    Obesity hypoventilation syndrome 03/31/2012   OSA treated with BiPAP 09/29/2012   PAH (pulmonary artery hypertension)    a. 04/2019 RHC: RA 19, RV 80/20, PA 78/31 (51), PCWP 25, CO/CI 7.69/3.26. PVR 3.25 --> Sev PAH, likely primarily PV HTN; b.) RHC 04/04/2022: mRA 13, mPA 40, mPCWP 20, PA sat 59, AO sat 92, CO 7.63, CI 3.26, PVR 2.6 --> mod portal venous HTN   Pancreatitis    S/P CABG x 4 08/13/2002   a.) LIMA-LAD, LRA-OM, SVG-D1, SVG-dRCA   S/P gastric bypass    Synovitis of finger 06/11/2019   Trigger finger, unspecified little finger 12/23/2018   Type 2 diabetes mellitus with hyperglycemia, with long-term current use of insulin 09/09/2014   a.) has FreeStyle Libre CGM   Type 2 MI (myocardial infarction) (HCC)    Ulcer 05/2016   Left shin   Wears glasses     Past Surgical History:  Procedure Laterality Date   CARDIAC CATHETERIZATION N/A 08/10/2002   CATARACT EXTRACTION W/PHACO Right 10/29/2017   Procedure: CATARACT EXTRACTION PHACO AND INTRAOCULAR LENS PLACEMENT (IOC)  RIGHT DIABETIC;  Surgeon: Lockie Mola, MD;  Location: [facility];  Service: Ophthalmology;  Laterality: Right   CATARACT EXTRACTION W/PHACO Left 11/18/2017   Procedure: CATARACT EXTRACTION PHACO AND INTRAOCULAR LENS PLACEMENT (IOC) LEFT IVA/TOPICAL;  Surgeon: Lockie Mola, MD;  Location: [facility];  Service: Ophthalmology;  Laterality: Left   COLONOSCOPY WITH PROPOFOL N/A 04/22/2013   Procedure: COLONOSCOPY WITH PROPOFOL;  Surgeon: Rachael Fee, MD;  Location: [facility];  Service: Endoscopy;  Laterality: N/A;   CORONARY ANGIOPLASTY WITH STENT PLACEMENT N/A 1996   CORONARY ARTERY BYPASS GRAFT N/A 08/13/2002   Procedure: 4v CORONARY ARTERY BYPASS GRAFTING; Location: [facility]; Surgeon: Katy Apo, MD   CYSTOSCOPY WITH INSERTION OF UROLIFT N/A 12/25/2021   Procedure: CYSTOSCOPY WITH INSERTION OF UROLIFT;  Surgeon: Riki Altes, MD;  Location: [facility];  Service: Urology;  Laterality: N/A;   DG ANGIO AV SHUNT*L*     right and left upper arms   FASCIOTOMY  03/03/2012   Procedure: FASCIOTOMY;  Surgeon: Nicki Reaper, MD;  Location: [facility];  Service: Orthopedics;  Laterality: Right;  FASCIOTOMY RIGHT SMALL FINGER   FASCIOTOMY Left 08/17/2013   Procedure: FASCIOTOMY LEFT RING;  Surgeon: Nicki Reaper, MD;  Location: [facility];  Service: Orthopedics;  Laterality: Left;   INCISION AND DRAINAGE ABSCESS Left 10/15/2015   Procedure: INCISION AND DRAINAGE ABSCESS;  Surgeon: Leafy Ro, MD;  Location: [facility];  Service: General;  Laterality: Left;   KIDNEY TRANSPLANT  09/13/2010   cadaver--at [facility] HEART CATH N/A 11/15/2016   Procedure: RIGHT HEART CATH;  Surgeon: Iran Ouch, MD;  Location: [facility];  Service: Cardiovascular;  Laterality: N/A;   RIGHT HEART CATH N/A 05/03/2019   Procedure: RIGHT HEART CATH;  Surgeon: Laurey Morale, MD;  Location: [facility] CV [facility];  Service: Cardiovascular;  Laterality: N/A;   RIGHT HEART CATH N/A 04/04/2022   Procedure: RIGHT HEART CATH;  Surgeon: Laurey Morale, MD;  Location: [facility] CV LAB;  Service: Cardiovascular;  Laterality: N/A;   ROUX-EN-Y GASTRIC BYPASS N/A 2015   TYMPANIC MEMBRANE REPAIR Left 03/2010   VASECTOMY      WART FULGURATION N/A 05/31/2022   Procedure: FULGURATION ANAL WART;  Surgeon: Sung Amabile, DO;  Location: [facility];  Service: General;  Laterality: N/A;    Family History  Problem Relation Age of Onset   Heart disease Father    Kidney failure Father    Kidney disease Father    Diabetes Maternal Grandmother    Breast cancer Maternal Grandmother    Valvular heart disease Mother    Liver cancer Paternal Uncle    Liver cancer Paternal Grandmother    Prostate cancer Neg Hx     Social History   Socioeconomic History   Marital status: Married    Spouse name: Not on file   Number of children: 2   Years of education: 14   Highest education level: Associate degree: academic program  Occupational History   Occupation: Presenter, broadcasting: unemployed    Comment: disabled due to kidney failure  Tobacco Use   Smoking status: Former    Types: Cigars    Quit date: 09/02/1994    Years since quitting: 27.9    Passive exposure: Never   Smokeless tobacco: Never  Vaping Use   Vaping Use: Never used  Substance and Sexual Activity   Alcohol use: Not Currently    Comment: occ   Drug use: No   Sexual activity: Not on file  Other Topics Concern   Not on file  Social History Narrative   Has living will   Wife is health care POA   Would accept resuscitation attempts   No tube  feedings if cognitively unaware   Social Determinants of Health   Financial Resource Strain: Not on file  Food Insecurity: No Food Insecurity (08/05/2022)   Hunger Vital Sign    Worried About Running Out of Food in the Last Year: Never true    Ran Out of Food in the Last Year: Never true  Transportation Needs: No Transportation Needs (08/05/2022)   PRAPARE - Administrator, Civil Service (Medical): No    Lack of Transportation (Non-Medical): No  Physical Activity: Not on file  Stress: Not on file  Social Connections: Not on file  Intimate Partner Violence: Not At Risk (08/05/2022)    Humiliation, Afraid, Rape, and Kick questionnaire    Fear of Current or Ex-Partner: No    Emotionally Abused: No    Physically Abused: No    Sexually Abused: No   Review of Systems No N/V Eating okay Blood work yesterday--potassium very low (dose increased from 8 pills to 11)    Objective:   Physical Exam Constitutional:      Appearance: Normal appearance.  Cardiovascular:     Rate and Rhythm: Normal rate. Rhythm irregular.     Heart sounds: No murmur heard.    No gallop.  Pulmonary:     Effort: Pulmonary effort is normal.     Breath sounds: Normal breath sounds. No wheezing or rales.  Abdominal:     General: Bowel sounds are normal. There is no distension.     Palpations: Abdomen is soft.     Tenderness: There is no abdominal tenderness. There is no guarding.  Musculoskeletal:     Cervical back: Neck supple.  Lymphadenopathy:     Cervical: No cervical adenopathy.  Neurological:     Mental Status: He is alert.            Assessment & Plan:

## 2022-08-07 ENCOUNTER — Ambulatory Visit: Payer: Commercial Managed Care - PPO | Admitting: Urology

## 2022-08-07 ENCOUNTER — Encounter: Payer: Self-pay | Admitting: Urology

## 2022-08-07 VITALS — BP 157/73 | HR 80 | Ht 70.0 in | Wt 262.0 lb

## 2022-08-07 DIAGNOSIS — N400 Enlarged prostate without lower urinary tract symptoms: Secondary | ICD-10-CM

## 2022-08-07 DIAGNOSIS — E291 Testicular hypofunction: Secondary | ICD-10-CM | POA: Diagnosis not present

## 2022-08-07 DIAGNOSIS — N486 Induration penis plastica: Secondary | ICD-10-CM | POA: Diagnosis not present

## 2022-08-07 DIAGNOSIS — N529 Male erectile dysfunction, unspecified: Secondary | ICD-10-CM | POA: Diagnosis not present

## 2022-08-07 LAB — MICROSCOPIC EXAMINATION
Bacteria, UA: NONE SEEN
Epithelial Cells (non renal): NONE SEEN /hpf (ref 0–10)

## 2022-08-07 LAB — URINALYSIS, COMPLETE
Bilirubin, UA: NEGATIVE
Ketones, UA: NEGATIVE
Leukocytes,UA: NEGATIVE
Nitrite, UA: NEGATIVE
Protein,UA: NEGATIVE
RBC, UA: NEGATIVE
Specific Gravity, UA: <1.005 — ABNORMAL LOW (ref 1.005–1.030)
Urobilinogen, Ur: 0.2 mg/dL (ref 0.2–1.0)
pH, UA: 5 (ref 5.0–7.5)

## 2022-08-07 NOTE — Progress Notes (Signed)
[name],acting as a scribe for Carl Altes, MD.,have documented all relevant documentation on the behalf of Carl Altes, MD,as directed by  Carl Altes, MD while in the presence of Carl Altes, MD.   08/07/2022 9:56 AM   [name]. 07-Apr-1960 [id number]  Referring provider: [name], MD [address] [place],  [place] [address]  Chief Complaint  Patient presents with   Benign Prostatic Hypertrophy    Urologic History:  BPH with LUTS -Status post UroLift 12/25/2021.  Hypogonadism -Maintained with topical testosterone.   HPI: 62 y.o. male presents for 6 month follow-up.  Voiding symptoms have worsened since his last visit. IPSS today 16/35, however PVR was 1 mL. Hospitalized late March 2023 with UTI/bacteremia-CT showed no evidence of bladder stones. He did restart topical testosterone and is doing 2 pumps daily. Testosterone level January 2024 was in the 400 range. Does complain of some tiredness and fatigue. PSA and hematocrit have been stable. He saw Carollee Herter for intracavernosal injection training, however is currently not utilizing due to pain and he was unable to achieve orgasm. Cannot take PDE-5 inhibitors secondary to chronic nitrate therapy.   PMH: Past Medical History:  Diagnosis Date   Anal condyloma    Anemia    Aortic atherosclerosis (HCC)    Aortic stenosis    a.) TTE 10/2021: Mod AS. AVA 1.06cm^2 (VTI). Mean grad ; b.) TTE 04/02/2022: no AV stenosis   Asthma, persistent controlled 02/25/2013   Attention or concentration deficit 04/26/2021   BPH with obstruction/lower urinary tract symptoms 09/25/2014   CAD (coronary artery disease) 2005   a.) LHC 1996 -> HG stenosis mLAD -> PTCA/PCI with stent x 1 (unk type) -> complicated by CFA pseudoanurysm (required surg);  b.) LHC 08/10/2002  -> 95% LAD, 70% ISR LAD, 80% pOM, 70% mRCA --> CVTS consult; c.) 4v CABG 08/13/2002; c.) 03/2018 MV: Small, fixed  inferoapical and apical sep defect. No isc. EF 47% (60-65% by 05/2018 TTE); d.) 02/2021 MV: small Apical inf fixed defect. No isc -> low risk.   Cardiomegaly    Cellulitis and abscess of left leg 07/22/2020   Chronic atrial fibrillation    a.) CHA2DS2VASc = 4 (CHF, HTN, vascular disease history, T2DM);  b.) rate/rhythm maintained without pharmacological intervention; chronically anticoagulated with apixaban   Chronic combined systolic and diastolic CHF (congestive heart failure)    a.) TTE 7/18 : EF 45-50%; b.) TTE 05/2018 : EF 60-65%, RVSP 74.8; c.) TTE 12/2018: EF 50-55%. Sev dil LA; d.) TTE 04/2019: EF 45-50%; e.) TTE 07/2021: EF 40-45%, G3DD; f.) TTE 10/2021: EF 40-45%, mild conc LVH, septal-lat dyssynchrony (LBBB), mildly red RVSF, mild-mod dil LA, mildly dil RA, mild-mod MS, mod AS; g.) TTE 04/02/2022: EF 40-45%, glob HK, LVH, red RVSF, RVE, sev LAE, mild-mod RAE, mild MR   Chronic venous stasis dermatitis of both lower extremities    Cytomegaloviral disease 2017   Diverticulosis of colon 04/22/2013   Elevated RIGHT hemidiaphragm    Erectile dysfunction    a.) on topical TRT + Trimix (alprostadil/papaverine/phentolamine) injections   ESRD (end stage renal disease) (HCC)    a.) dialysis dependent 2004-2012; b.) s/p cadaveric RIGHT renal transplant 09/13/2010   Essential hypertension 12/17/2010   GERD (gastroesophageal reflux disease)    Gout    History of 2019 novel coronavirus disease (COVID-19) 06/30/2019   History of bilateral cataract extraction 10/2017   History of renal transplant 09/13/2010   a.) s/p  cadaveric donor transplant 09/13/2010   Hx of bilateral cataract extraction 10/2017   Hyperlipidemia    Hypogonadism in male 09/25/2014   Ischemic cardiomyopathy    Left cephalic vein thrombosis 06/2019   Long term current use of anticoagulant    a.) apixaban   Long term current use of immunosuppressive drug    a.) mycophenolate + prednisone + tacrolimus   Major depressive disorder  10/04/2013   Mitral stenosis 11/07/2021   a.) TTE 11/07/2021: severe MAC, mild-mod MS (MPG 6 mmHg); b.) TTE 04/02/2022: no MV stenosis   Murmur    Obesity hypoventilation syndrome 03/31/2012   OSA treated with BiPAP 09/29/2012   PAH (pulmonary artery hypertension)    a. 04/2019 RHC: RA 19, RV 80/20, PA 78/31 (51), PCWP 25, CO/CI 7.69/3.26. PVR 3.25 --> Sev PAH, likely primarily PV HTN; b.) RHC 04/04/2022: mRA 13, mPA 40, mPCWP 20, PA sat 59, AO sat 92, CO 7.63, CI 3.26, PVR 2.6 --> mod portal venous HTN   Pancreatitis    S/P CABG x 4 08/13/2002   a.) LIMA-LAD, LRA-OM, SVG-D1, SVG-dRCA   S/P gastric bypass    Synovitis of finger 06/11/2019   Trigger finger, unspecified little finger 12/23/2018   Type 2 diabetes mellitus with hyperglycemia, with long-term current use of insulin 09/09/2014   a.) has FreeStyle Libre CGM   Type 2 MI (myocardial infarction) (HCC)    Ulcer 05/2016   Left shin   Wears glasses     Surgical History: Past Surgical History:  Procedure Laterality Date   CARDIAC CATHETERIZATION N/A 08/10/2002   CATARACT EXTRACTION W/PHACO Right 10/29/2017   Procedure: CATARACT EXTRACTION PHACO AND INTRAOCULAR LENS PLACEMENT (IOC)  RIGHT DIABETIC;  Surgeon: Lockie Mola, MD;  Location[facility];  Service: Ophthalmology;  Laterality: Right   CATARACT EXTRACTION W/PHACO Left 11/18/2017   Procedure: CATARACT EXTRACTION PHACO AND INTRAOCULAR LENS PLACEMENT (IOC) LEFT IVA/TOPICAL;  Surgeon: Lockie Mola, MD;  Location: [facility];  Service: Ophthalmology;  Laterality: Left   COLONOSCOPY WITH PROPOFOL N/A 04/22/2013   Procedure: COLONOSCOPY WITH PROPOFOL;  Surgeon: Rachael Fee, MD;  Location: [facility];  Service: Endoscopy;  Laterality: N/A;   CORONARY ANGIOPLASTY WITH STENT PLACEMENT N/A 1996   CORONARY ARTERY BYPASS GRAFT N/A 08/13/2002   Procedure: 4v CORONARY ARTERY BYPASS GRAFTING; Location: [facility]; Surgeon: Katy Apo, MD    CYSTOSCOPY WITH INSERTION OF UROLIFT N/A 12/25/2021   Procedure: CYSTOSCOPY WITH INSERTION OF UROLIFT;  Surgeon: Carl Altes, MD;  Location: [facility];  Service: Urology;  Laterality: N/A;   DG ANGIO AV SHUNT*L*     right and left upper arms   FASCIOTOMY  03/03/2012   Procedure: FASCIOTOMY;  Surgeon: Nicki Reaper, MD;  Location: [facility];  Service: Orthopedics;  Laterality: Right;  FASCIOTOMY RIGHT SMALL FINGER   FASCIOTOMY Left 08/17/2013   Procedure: FASCIOTOMY LEFT RING;  Surgeon: Nicki Reaper, MD;  Location: [facility];  Service: Orthopedics;  Laterality: Left;   INCISION AND DRAINAGE ABSCESS Left 10/15/2015   Procedure: INCISION AND DRAINAGE ABSCESS;  Surgeon: Leafy Ro, MD;  Location: [facility];  Service: General;  Laterality: Left;   KIDNEY TRANSPLANT  09/13/2010   cadaver--at [facility] HEART CATH N/A 11/15/2016   Procedure: RIGHT HEART CATH;  Surgeon: Iran Ouch, MD;  Location: [facility];  Service: Cardiovascular;  Laterality: N/A;   RIGHT HEART CATH N/A 05/03/2019   Procedure: RIGHT HEART CATH;  Surgeon: Laurey Morale, MD;  Location: [facility] CV [facility];  Service: Cardiovascular;  Laterality: N/A;   RIGHT HEART CATH N/A 04/04/2022   Procedure: RIGHT HEART CATH;  Surgeon: Laurey Morale, MD;  Location: [facility] CV [facility];  Service: Cardiovascular;  Laterality: N/A;   ROUX-EN-Y GASTRIC BYPASS N/A 2015   TYMPANIC MEMBRANE REPAIR Left 03/2010   VASECTOMY     WART FULGURATION N/A 05/31/2022   Procedure: FULGURATION ANAL WART;  Surgeon: Sung Amabile, DO;  Location: [facility];  Service: General;  Laterality: N/A;    Home Medications:  Allergies as of 08/07/2022       Reactions   Contrast Media [iodinated Contrast Media] Other (See Comments)   Kidney transplant   Iodine Other (See Comments)   Other reaction(s): Other (See Comments) Kidney transplant   Ibuprofen Other (See Comments)   Due to kidney transplant Due to  kidney transplant Due to kidney transplant        Medication List        Accurate as of Aug 07, 2022  9:56 AM. If you have any questions, ask your nurse or doctor.          albuterol (2.5 MG/3ML) 0.083% nebulizer solution Commonly known as: PROVENTIL Inhale one vial (3 ml) via nebulizer every 6 hours as needed for wheezing or shortness of breath (Take 3 mLs (2.5 mg total) by nebulization every 6 (six) hours as needed for wheezing or shortness of breath.)   albuterol 108 (90 Base) MCG/ACT inhaler Commonly known as: VENTOLIN HFA Inhale 2 puffs into the lungs every 6 (six) hours as needed for wheezing or shortness of breath.   AMBULATORY NON FORMULARY MEDICATION Trimix (30/1/10)-(Pap/Phent/PGE)  Test Dose  1ml vial   Qty #3 Refills 0  [facility] [phone] Fax [phone]   apixaban 5 MG Tabs tablet Commonly known as: ELIQUIS Take 1 tablet (5 mg total) by mouth 2 (two) times daily. Hold till 07/10/2022 and restart on 07/11/22   buPROPion 150 MG 12 hr tablet Commonly known as: WELLBUTRIN SR Take 1 tablet (150 mg total) by mouth 2 (two) times daily.   cholecalciferol 25 MCG (1000 UNIT) tablet Commonly known as: VITAMIN D3 Take 1,000 Units by mouth at bedtime.   colchicine 0.6 MG tablet Take 1 tablet (0.6 mg total) by mouth every other day.   cyanocobalamin 100 MCG tablet Take 100 mcg by mouth daily.   cyclobenzaprine 5 MG tablet Commonly known as: FLEXERIL Take by mouth.   Farxiga 10 MG Tabs tablet Generic drug: dapagliflozin propanediol Take 1 tablet (10 mg total) by mouth daily before breakfast.   febuxostat 40 MG tablet Commonly known as: ULORIC Take 1 tablet (40 mg total) by mouth daily.   Ferrex 150 150 MG capsule Generic drug: iron polysaccharides Take 1 capsule (150 mg total) by mouth 2 (two) times daily.   fluticasone 50 MCG/ACT nasal spray Commonly known as: FLONASE PLACE 2 SPRAYS INTO BOTH NOSTRILS DAILY.   FreeStyle Libre 3  Sensor Misc Use one every 2 weeks.   gabapentin 300 MG capsule Commonly known as: NEURONTIN Take 1 capsule (300 mg total) by mouth 2 (two) times daily.   hydrALAZINE 25 MG tablet Commonly known as: APRESOLINE Take 1 tablet (25 mg total) by mouth every 8 (eight) hours.   HYDROcodone-acetaminophen 5-325 MG tablet Commonly known as: NORCO/VICODIN Take 1-2 tablets by mouth every 6 (six) hours as needed for moderate pain.   hydrocortisone 2.5 % cream Apply topically 3 (  three) times daily as needed.   insulin lispro 100 UNIT/ML KwikPen Commonly known as: HumaLOG KwikPen Inject 10-20 Units into the skin 3 (three) times daily. Per sliding scale   isosorbide mononitrate 30 MG 24 hr tablet Commonly known as: IMDUR Take 1 tablet (30 mg total) by mouth daily.   lidocaine 5 % Commonly known as: LIDODERM Place 1 patch onto the skin daily.PLACE 1 PATCH ONTO THE SKIN FOR 12 (TWELVE) HOURS. REMOVE & DISCARD PATCH WITHIN 12 HOURS OR AS DIRECTED BY MD What changed:  when to take this reasons to take this   lidocaine 5 % ointment Commonly known as: XYLOCAINE Apply 1 Application topically 3 (three) times daily as needed. Apply pea size amount to area as needed for pain. What changed: Another medication with the same name was changed. Make sure you understand how and when to take each.   losartan 25 MG tablet Commonly known as: COZAAR Take 1 tablet (25 mg total) by mouth daily.   LUTEIN PO Take 1 tablet by mouth daily.   metolazone 2.5 MG tablet Commonly known as: ZAROXOLYN Take 1 tablet by mouth 3 times weekly.   montelukast 10 MG tablet Commonly known as: SINGULAIR TAKE 1 TABLET BY MOUTH AT BEDTIME. What changed: how much to take   multivitamin with minerals Tabs tablet Take 1 tablet by mouth daily.   mycophenolate 180 MG EC tablet Commonly known as: MYFORTIC Take 2 tablets (360 mg total) by mouth 2 (two) times daily.   omeprazole 20 MG capsule Commonly known as:  PRILOSEC TAKE 1 CAPSULE BY MOUTH DAILY. What changed:  how much to take when to take this   predniSONE 5 MG tablet Commonly known as: DELTASONE Take 1 tablet (5 mg total) by mouth daily.   rosuvastatin 10 MG tablet Commonly known as: CRESTOR Take 1 tablet (10 mg total) by mouth daily. What changed: when to take this   Semglee (yfgn) 100 UNIT/ML Pen Generic drug: insulin glargine-yfgn Inject 40 Units into the skin daily. What changed: how much to take   spironolactone 25 MG tablet Commonly known as: ALDACTONE Take by mouth.   sulfamethoxazole-trimethoprim 400-80 MG tablet Commonly known as: BACTRIM Take 1 tablet by mouth 3 (three) times a week every Monday, Wednesday, and Friday.   tacrolimus 1 MG capsule Commonly known as: PROGRAF Take 2 capsules (2 mg total) by mouth 2 times daily.   tamsulosin 0.4 MG Caps capsule Commonly known as: FLOMAX Take 1 capsule (0.4 mg total) by mouth daily.   Testosterone 10 MG/ACT (2%) Gel Apply 1 Pump topically daily to inner thighs - 2 pumps total.   torsemide 100 MG tablet Commonly known as: DEMADEX Take 1 tablet (100 mg total) by mouth 2 (two) times daily.   traMADol 50 MG tablet Commonly known as: ULTRAM Take by mouth.   Unifine Pentips 31G X 5 MM Misc Generic drug: Insulin Pen Needle Use 4 times daily as directed   zinc sulfate 220 (50 Zn) MG capsule Take 220 mg by mouth daily.        Allergies:  Allergies  Allergen Reactions   Contrast Media [Iodinated Contrast Media] Other (See Comments)    Kidney transplant   Iodine Other (See Comments)    Other reaction(s): Other (See Comments) Kidney transplant   Ibuprofen Other (See Comments)    Due to kidney transplant Due to kidney transplant Due to kidney transplant    Family History: Family History  Problem Relation Age of Onset  Heart disease Father    Kidney failure Father    Kidney disease Father    Diabetes Maternal Grandmother    Breast cancer Maternal  Grandmother    Valvular heart disease Mother    Liver cancer Paternal Uncle    Liver cancer Paternal Grandmother    Prostate cancer Neg Hx     Social History:  reports that he quit smoking about 27 years ago. His smoking use included cigars. He has never been exposed to tobacco smoke. He has never used smokeless tobacco. He reports that he does not currently use alcohol. He reports that he does not use drugs.   Physical Exam: BP (!) 157/73   Pulse 80   Ht 5\' 10"  (1.778 m)   Wt 262 lb (118.8 kg)   BMI 37.59 kg/m   Constitutional:  Alert and oriented, No acute distress. HEENT: [place] AT Respiratory: Normal respiratory effort, no increased work of breathing. Psychiatric: Normal mood and affect.   Assessment & Plan:    1.  BPH with LUTS Present voiding symptoms better than pre-op and PVR was 1 cc. 1 year follow up with IPSS/PVR.  2.  Hypogonadism Testosterone level today. 6 month lab visit for testosterone/hematocrit. 1 year follow up with testosterone, PSA, and hematocrit.  I have reviewed the above documentation for accuracy and completeness, and I agree with the above.   Carl Altes, MD  [facility] [address], [address] [place], [place] [address] [phone]

## 2022-08-07 NOTE — Progress Notes (Signed)
[name]. presents for an office/procedure visit. BP today is hight. He is complaint/noncompliant with BP medication. Greater than 140/90. Provider  notified. Pt advised to talk with his PCP about blood pressure. Pt voiced understanding.

## 2022-08-08 ENCOUNTER — Other Ambulatory Visit: Payer: Self-pay | Admitting: *Deleted

## 2022-08-08 DIAGNOSIS — R197 Diarrhea, unspecified: Secondary | ICD-10-CM | POA: Diagnosis not present

## 2022-08-08 LAB — TESTOSTERONE: Testosterone: 490 ng/dL (ref 264–916)

## 2022-08-08 LAB — HEMOGLOBIN AND HEMATOCRIT, BLOOD
Hematocrit: 39.7 % (ref 37.5–51.0)
Hemoglobin: 12 g/dL — ABNORMAL LOW (ref 13.0–17.7)

## 2022-08-09 DIAGNOSIS — N184 Chronic kidney disease, stage 4 (severe): Secondary | ICD-10-CM | POA: Diagnosis not present

## 2022-08-09 DIAGNOSIS — D631 Anemia in chronic kidney disease: Secondary | ICD-10-CM | POA: Diagnosis not present

## 2022-08-09 DIAGNOSIS — D509 Iron deficiency anemia, unspecified: Secondary | ICD-10-CM | POA: Diagnosis not present

## 2022-08-10 LAB — GASTROINTESTINAL PATHOGEN PNL
CampyloBacter Group: NOT DETECTED
Norovirus GI/GII: NOT DETECTED
Rotavirus A: NOT DETECTED
Salmonella species: NOT DETECTED
Shiga Toxin 1: NOT DETECTED
Shiga Toxin 2: NOT DETECTED
Shigella Species: NOT DETECTED
Vibrio Group: NOT DETECTED
Yersinia enterocolitica: NOT DETECTED

## 2022-08-12 DIAGNOSIS — M109 Gout, unspecified: Secondary | ICD-10-CM | POA: Diagnosis not present

## 2022-08-12 DIAGNOSIS — M79671 Pain in right foot: Secondary | ICD-10-CM | POA: Diagnosis not present

## 2022-08-12 DIAGNOSIS — M7671 Peroneal tendinitis, right leg: Secondary | ICD-10-CM | POA: Diagnosis not present

## 2022-08-12 DIAGNOSIS — L600 Ingrowing nail: Secondary | ICD-10-CM | POA: Diagnosis not present

## 2022-08-12 DIAGNOSIS — E119 Type 2 diabetes mellitus without complications: Secondary | ICD-10-CM | POA: Diagnosis not present

## 2022-08-12 DIAGNOSIS — M0579 Rheumatoid arthritis with rheumatoid factor of multiple sites without organ or systems involvement: Secondary | ICD-10-CM | POA: Diagnosis not present

## 2022-08-13 ENCOUNTER — Other Ambulatory Visit: Payer: Self-pay | Admitting: Internal Medicine

## 2022-08-13 ENCOUNTER — Other Ambulatory Visit: Payer: Self-pay | Admitting: Student

## 2022-08-13 ENCOUNTER — Other Ambulatory Visit: Payer: Self-pay

## 2022-08-13 DIAGNOSIS — J309 Allergic rhinitis, unspecified: Secondary | ICD-10-CM

## 2022-08-13 LAB — MULTIPLE MYELOMA PANEL, SERUM
Albumin SerPl Elph-Mcnc: 3.6 g/dL (ref 2.9–4.4)
Albumin/Glob SerPl: 1.1 (ref 0.7–1.7)
Alpha 1: 0.3 g/dL (ref 0.0–0.4)
Alpha2 Glob SerPl Elph-Mcnc: 0.8 g/dL (ref 0.4–1.0)
B-Globulin SerPl Elph-Mcnc: 0.9 g/dL (ref 0.7–1.3)
Gamma Glob SerPl Elph-Mcnc: 1.7 g/dL (ref 0.4–1.8)
Globulin, Total: 3.6 g/dL (ref 2.2–3.9)
IgA: 194 mg/dL (ref 61–437)
IgG (Immunoglobin G), Serum: 1703 mg/dL — ABNORMAL HIGH (ref 603–1613)
IgM (Immunoglobulin M), Srm: 254 mg/dL — ABNORMAL HIGH (ref 20–172)
M Protein SerPl Elph-Mcnc: 0.7 g/dL — ABNORMAL HIGH
Total Protein ELP: 7.2 g/dL (ref 6.0–8.5)

## 2022-08-13 MED FILL — Dapagliflozin Propanediol Tab 10 MG (Base Equivalent): ORAL | 30 days supply | Qty: 30 | Fill #4 | Status: AC

## 2022-08-14 ENCOUNTER — Other Ambulatory Visit: Payer: Self-pay

## 2022-08-14 MED ORDER — FLUTICASONE PROPIONATE 50 MCG/ACT NA SUSP
2.0000 | Freq: Every day | NASAL | 11 refills | Status: AC
Start: 2022-08-14 — End: ?
  Filled 2022-08-14: qty 16, 30d supply, fill #0
  Filled 2022-09-19: qty 16, 30d supply, fill #1
  Filled 2022-10-31: qty 16, 30d supply, fill #2
  Filled 2023-01-07: qty 16, 30d supply, fill #3
  Filled 2023-02-14: qty 16, 30d supply, fill #4
  Filled 2023-03-12: qty 16, 30d supply, fill #5
  Filled 2023-04-19: qty 16, 30d supply, fill #6
  Filled 2023-05-14: qty 16, 30d supply, fill #7
  Filled 2023-07-23: qty 16, 30d supply, fill #8

## 2022-08-14 MED FILL — Hydralazine HCl Tab 25 MG: ORAL | 30 days supply | Qty: 90 | Fill #0 | Status: AC

## 2022-08-15 ENCOUNTER — Ambulatory Visit: Payer: Commercial Managed Care - PPO | Admitting: Family Medicine

## 2022-08-19 ENCOUNTER — Other Ambulatory Visit: Payer: Self-pay

## 2022-08-20 ENCOUNTER — Telehealth: Payer: Self-pay | Admitting: Cardiovascular Disease

## 2022-08-20 ENCOUNTER — Other Ambulatory Visit: Payer: Self-pay

## 2022-08-20 ENCOUNTER — Other Ambulatory Visit
Admission: RE | Admit: 2022-08-20 | Discharge: 2022-08-20 | Disposition: A | Discharge status: Home / Self Care | Payer: Commercial Managed Care - PPO | Source: Ambulatory Visit | Attending: Cardiology | Admitting: Cardiology

## 2022-08-20 ENCOUNTER — Ambulatory Visit (HOSPITAL_BASED_OUTPATIENT_CLINIC_OR_DEPARTMENT_OTHER): Payer: Commercial Managed Care - PPO | Admitting: Cardiology

## 2022-08-20 ENCOUNTER — Telehealth: Payer: Self-pay | Admitting: *Deleted

## 2022-08-20 VITALS — BP 155/84 | HR 70 | Ht 70.0 in | Wt 262.0 lb

## 2022-08-20 DIAGNOSIS — I5022 Chronic systolic (congestive) heart failure: Secondary | ICD-10-CM | POA: Insufficient documentation

## 2022-08-20 DIAGNOSIS — E875 Hyperkalemia: Secondary | ICD-10-CM

## 2022-08-20 LAB — BASIC METABOLIC PANEL
Anion gap: 8 (ref 5–15)
BUN: 80 mg/dL — ABNORMAL HIGH (ref 8–23)
CO2: 28 mmol/L (ref 22–32)
Calcium: 9.4 mg/dL (ref 8.9–10.3)
Chloride: 104 mmol/L (ref 98–111)
Creatinine, Ser: 2.44 mg/dL — ABNORMAL HIGH (ref 0.61–1.24)
GFR, Estimated: 29 mL/min — ABNORMAL LOW (ref >60)
Glucose, Bld: 323 mg/dL — ABNORMAL HIGH (ref 70–99)
Potassium: 6.4 mmol/L (ref 3.5–5.1)
Sodium: 140 mmol/L (ref 135–145)

## 2022-08-20 LAB — BRAIN NATRIURETIC PEPTIDE: B Natriuretic Peptide: 471.8 pg/mL — ABNORMAL HIGH (ref 0.0–100.0)

## 2022-08-20 MED ORDER — HYDRALAZINE HCL 50 MG PO TABS
50.0000 mg | ORAL_TABLET | Freq: Three times a day (TID) | ORAL | 6 refills | Status: DC
  Filled 2022-08-20: qty 90, 30d supply, fill #0
  Filled 2022-09-13: qty 90, 30d supply, fill #1
  Filled 2022-10-31: qty 90, 30d supply, fill #2

## 2022-08-20 MED ORDER — ISOSORBIDE MONONITRATE ER 60 MG PO TB24
30.0000 mg | ORAL_TABLET | Freq: Every day | ORAL | 6 refills | Status: DC
  Filled 2022-08-20: qty 30, 60d supply, fill #0
  Filled 2022-10-31: qty 30, 60d supply, fill #1
  Filled 2023-01-15: qty 30, 60d supply, fill #2

## 2022-08-20 MED ORDER — LOKELMA 10 G PO PACK: 10.0000 g | PACK | Freq: Once | ORAL | 0 refills | Status: AC

## 2022-08-20 NOTE — Patient Instructions (Signed)
Routine lab work today. Will notify you of abnormal results  INCREASE Hydralazine to 50mg  three times daily   INCREASE IMDUR to 60mg  daily   Follow up in 2 months  Do the following things EVERYDAY: Weigh yourself in the morning before breakfast. Write it down and keep it in a log. Take your medicines as prescribed Eat low salt foods--Limit salt (sodium) to 2000 mg per day.  Stay as active as you can everyday Limit all fluids for the day to less than 2 liters

## 2022-08-20 NOTE — Telephone Encounter (Signed)
Geroge Baseman Bovina, RN 08/20/2022  4:16 PM EDT Back to Top    Spoke w/pt, he is aware, agreeable, and verbalizes understanding. He states he is currently in [facility] but will be back in town tomorrow, rx for [employer] sent to [facility], he will come for repeat labs tomorrow before 5 pm, order placed.

## 2022-08-20 NOTE — Telephone Encounter (Signed)
[facility] calling with a critical result.

## 2022-08-20 NOTE — Progress Notes (Signed)
Advanced Heart Failure Clinic Note   Date:  08/20/2022   ID:  [name]., DOB [birth year], MRN [id number]   Provider location: [address], [place] Type of Visit:  Established patient  PCP:  [name], MD  Cardiologist:  Dr. Kirke Corin Nephrology: Dr. Yetta [name]  HF Cardiologist: Dr. Shirlee Latch  HPI: Carl Doane Plude. is a 62 y.o. male  with a history of CAD s/p CABG 2005, chronic atrial fibrillation on Eliquis, ESRD s/p renal transplant 2012, anc chronic diastolic CHF.  His renal function has been fairly stable, most recent creatinine 1.43.  He has been struggling with volume overload/CHF.  He has a history of ischemic cardiomyopathy with EF as low as 35-40% in the past but echo in 3/20 showed EF 60-65%.  There is evidence for RV failure with pulmonary hypertension by echo.    Admitted 11/05/18 low grade fever and shortness of breath. HS Trop negative. Covid 19 negative. Blood CX negative. Placed on antibiotics and discharged to home the next day.   Admitted 05/03/19 with A/C Diastolic HF. Diuresed with IV lasix and transitioned to torsemide 80/40 mg . Discharge weight 252 pounds.   Admitted 06/2019 with left arm pain. Left upper extremity venous Doppler revealed occluded cephalic vein at the AV fistula outflow tract in the antecubital fossa with no other thrombus identified. Vascular evaluated. He continued on Eliquis.  No plan for intervention. Also treated for acute gout flare. Discharged on prednisone taper.     Given IV lasix on 12/21 by HF Paramedicine.   Admitted to [facility] 2/22 with acute gout flare vs cellulitis. Nephrology was consulted to evaluate transplant regimen and follow along. He was treated with antibiotics and prednisone. Discharged on decreased torsemide dose of 40 mg daily. Weight 259 lbs.  Admitted 4/22 for left leg cellulitis after sustaining a dog scratch, received IV antibiotics. No changes to his diuretic therapy. Discharge weight 266  lbs.  Follow up with Dr. Kirke Corin, 1/23, he had CP and underwent Lexiscan myoview, which overall was a low risk study with evidence of prior infarct in the infero-apical area with no significant change from before. Losartan added to GDMT regimen.  Follow up 5/23, volume overloaded, REDs 40%. Advised taking additional metolazone (3 doses that week), however labs showed worsening renal function and advised to keep at 2 doses.  Admitted in 5/23 with acute on chronic CHF and AKI.  Wonda Cerise and losartan stopped. Diuresed with IV lasix. Echo showed EF 40-45%, RV mildly reduced, GIIIDD (restrictive), IVC dilated. Nephrology consulted for diuretic dosing. Discharged home, weight 266 lbs.  He had chest pain in 8/23 and was admitted at [facility], minimal HS-TnI elevation with no trend, thought to be demand ischemia from volume overload.    Echo 8/23 showing EF 40-45%, diffuse hypokinesis with dyssynchrony, mildly dilated RV with mildly decreased systolic function, IVC dilated, moderate AS with mean gradient 15, mild-moderate mitral stenosis with mean gradient 6.   Follow up 03/27/22, volume overloaded with 25 lbs weight gain. Instructed to use Furoscix x 3 days, then resume home dose of torsemide + metolazone. Clinic weight 277 lbs.  Admitted from [facility] 04/01/22 with a/c CHF. Echo showed EF 40-45%, RV moderately reduced and RVSP 49 mmHg. RHC showed elevated filling pressures, moderate primarily pulmonary venous hypertension and preserved CO. Tadalafil stopped with primarily PH group 2 and 3. He was diuresed with IV lasix and metolazone, and GDMT added back. Overall down 19 lbs. He was  discharged home, weight 255 lbs.  Patient was admitted in 3/24 with Serratia bacteremia/pyelonephritis, treated with ertapenem.  He also had campylobacter diarrhea.  In 4/24, he tripped getting off his riding lawnmower and fell, fracturing ribs. He had a pulmonary contusion with this.   Today he returns for HF follow up. He is back on  torsemide 100 mg bid with metolazone 3 days/week. K low recently, was increased to 80 mEq bid.  He has been feeling good overall, can walk 1 mile without dyspnea on flat ground.  Still short of breath with inclines. No orthopnea/PND.  No chest pain.  No lightheadedness.  He is going to the beach soon, plans to go fishing. Weight stable. BP elevated.   Labs (7/19): LDL 49 Labs (2/20): K 3.7, creatinine 1.43 Labs (4/20): K 4.3, creatinine 1.24 Labs (11/06/18): K 3.7 creatinine 1.23  Labs (05/07/19): K 3.8 Creatinine 1.5  Labs (07/18/19): K 4.3 Creatinine 1.42  Labs (5/21): K 3.3, creatinine 1.21 Labs (11/21): K 3.6, creatinine 1.4 Labs (12/21): K 3.3, creatinine 2.4, hgb 12.8 Labs (04/01/19): K 3.3 Creatinine 2.2  Labs (8/22): K 4.0, creatinine 1.6, HDL 35, LDL 36, a1c 7.5 Labs (2/23): LDL 31, HDL 36 Labs (4/23): K 3.3, creatinine 1.68 Labs (5/23): K 4.1, creatinine 2.24 Labs (8/23): K 3.5, creatinine 2.36 Labs (12/23): K 3.6, creatinine 1.99, hgb 9.3 Labs (1/24): K 4.6, creatinine 2.44 Labs (2/24): K 3.4, creatinine 2.75, LDL 25 Labs (4/24): K 4.1, creatinine 2.46 Labs (5/24): K 2.7, creatinine 2.25, IgG monoclonal protein on myeloma panel.   PMH: 1. CAD: s/p CABG x 4 in [place] in 2005.  - Cardiolite (1/20): EF 47%, small fixed apical septal defect, no ischemia.  Low risk study.  - Cardiolite (12/22): small apical inferior defect, no changes from prior, no ischemia. Low risk study. 2. Asthma 3. Gout 4. Atrial fibrillation: Chronic.  5. ESRD s/p renal transplantation at [facility] in 2012.  6. Anemia of chronic disease 7. Type II diabetes 8. HTN 9. Hyperlipidemia 10. OSA: On bipap.  11. Ischemic cardiomyopathy: EF 35-40% in past.  - Cardiolite (1/20) with EF 47%.  - Echo (1/20): EF 50-55%, mild MR, RV moderately dilated with moderately decreased systolic function, PASP 73 mmHg, moderate TR.   - Echo (3/20): EF 60-65%, PASP 75 mmHg, mildly dilated RV with mildly decreased systolic  function.  - Echo (2/21): EF 45-50%, global HK, moderately dilated RV with moderately decreased systolic function, D-shaped interventricular septum, dilated IVC.  - RHC (2/21): mean RA 19, PA 78/31 mean 51, mean PCWP 26, CI 3.26, PVR 3.25 - Echo (5/23): EF 40-45%, RV mildly reduced, GIIIDD (restrictive) - Echo (8/23): EF 40-45%, diffuse hypokinesis with dyssynchrony, mildly dilated RV with mildly decreased systolic function, IVC dilated, moderate AS with mean gradient 15, mild-moderate mitral stenosis with mean gradient 6.  - Echo (1/24): Echo showed EF 40-45%, RV moderately reduced and RVSP 49 mmHg, mild MR, no AS, dilated IVC.  - RHC (1/24): RA mean 13, PA 62/24 (mean 40), PCWP mean 20, CO/CI (Fick) 7.63/3.26, PVR 2.6 WU, PAPi 2.9 12. Obesity: s/p gastric bypass in 2015.  13. Bradycardia with beta blocker.  14. Aortic stenosis: Moderate on 8/23 echo.  15. Mitral stenosis: Mild to moderate on 8/23 echo.  16. Rheumatoid arthritis  Past Surgical History:  Procedure Laterality Date   CARDIAC CATHETERIZATION N/A 08/10/2002   CATARACT EXTRACTION W/PHACO Right 10/29/2017   Procedure: CATARACT EXTRACTION PHACO AND INTRAOCULAR LENS PLACEMENT (IOC)  RIGHT DIABETIC;  Surgeon:  Lockie Mola, MD;  Location: [facility];  Service: Ophthalmology;  Laterality: Right   CATARACT EXTRACTION W/PHACO Left 11/18/2017   Procedure: CATARACT EXTRACTION PHACO AND INTRAOCULAR LENS PLACEMENT (IOC) LEFT IVA/TOPICAL;  Surgeon: Lockie Mola, MD;  Location: [facility];  Service: Ophthalmology;  Laterality: Left   COLONOSCOPY WITH PROPOFOL N/A 04/22/2013   Procedure: COLONOSCOPY WITH PROPOFOL;  Surgeon: Rachael Fee, MD;  Location: [facility];  Service: Endoscopy;  Laterality: N/A;   CORONARY ANGIOPLASTY WITH STENT PLACEMENT N/A 1996   CORONARY ARTERY BYPASS GRAFT N/A 08/13/2002   Procedure: 4v CORONARY ARTERY BYPASS GRAFTING; Location: [facility]; Surgeon: Katy Apo, MD    CYSTOSCOPY WITH INSERTION OF UROLIFT N/A 12/25/2021   Procedure: CYSTOSCOPY WITH INSERTION OF UROLIFT;  Surgeon: Riki Altes, MD;  Location: [facility];  Service: Urology;  Laterality: N/A;   DG ANGIO AV SHUNT*L*     right and left upper arms   FASCIOTOMY  03/03/2012   Procedure: FASCIOTOMY;  Surgeon: Nicki Reaper, MD;  Location: [facility];  Service: Orthopedics;  Laterality: Right;  FASCIOTOMY RIGHT SMALL FINGER   FASCIOTOMY Left 08/17/2013   Procedure: FASCIOTOMY LEFT RING;  Surgeon: Nicki Reaper, MD;  Location: [facility];  Service: Orthopedics;  Laterality: Left;   INCISION AND DRAINAGE ABSCESS Left 10/15/2015   Procedure: INCISION AND DRAINAGE ABSCESS;  Surgeon: Leafy Ro, MD;  Location: [facility];  Service: General;  Laterality: Left;   KIDNEY TRANSPLANT  09/13/2010   cadaver--at [facility] HEART CATH N/A 11/15/2016   Procedure: RIGHT HEART CATH;  Surgeon: Iran Ouch, MD;  Location: [facility] CV LAB;  Service: Cardiovascular;  Laterality: N/A;   RIGHT HEART CATH N/A 05/03/2019   Procedure: RIGHT HEART CATH;  Surgeon: Laurey Morale, MD;  Location: [facility] CV LAB;  Service: Cardiovascular;  Laterality: N/A;   RIGHT HEART CATH N/A 04/04/2022   Procedure: RIGHT HEART CATH;  Surgeon: Laurey Morale, MD;  Location: [facility] INVASIVE CV LAB;  Service: Cardiovascular;  Laterality: N/A;   ROUX-EN-Y GASTRIC BYPASS N/A 2015   TYMPANIC MEMBRANE REPAIR Left 03/2010   VASECTOMY     WART FULGURATION N/A 05/31/2022   Procedure: FULGURATION ANAL WART;  Surgeon: Sung Amabile, DO;  Location: [facility];  Service: General;  Laterality: N/A;   Current Outpatient Medications  Medication Sig Dispense Refill   albuterol (PROVENTIL) (2.5 MG/3ML) 0.083% nebulizer solution Take 3 mLs (2.5 mg total) by nebulization every 6 (six) hours as needed for wheezing or shortness of breath. 150 mL 0   albuterol (VENTOLIN HFA) 108 (90 Base) MCG/ACT inhaler Inhale 2 puffs  into the lungs every 6 (six) hours as needed for wheezing or shortness of breath. 6.7 g 5   apixaban (ELIQUIS) 5 MG TABS tablet Take 1 tablet (5 mg total) by mouth 2 (two) times daily. Hold till 07/10/2022 and restart on 07/11/22 180 tablet 1   buPROPion (WELLBUTRIN SR) 150 MG 12 hr tablet Take 1 tablet (150 mg total) by mouth 2 (two) times daily. 180 tablet 3   cholecalciferol (VITAMIN D3) 25 MCG (1000 UNIT) tablet Take 1,000 Units by mouth at bedtime.     colchicine 0.6 MG tablet Take 1 tablet (0.6 mg total) by mouth every other day. 45 tablet 1   cyclobenzaprine (FLEXERIL) 5 MG tablet Take by mouth.     dapagliflozin propanediol (FARXIGA) 10 MG TABS tablet Take 1 tablet (10 mg total) by mouth daily before  breakfast. 90 tablet 3   febuxostat (ULORIC) 40 MG tablet Take 1 tablet (40 mg total) by mouth daily. 90 tablet 5   fluticasone (FLONASE) 50 MCG/ACT nasal spray Place 2 sprays into both nostrils daily. 16 g 11   gabapentin (NEURONTIN) 300 MG capsule Take 1 capsule (300 mg total) by mouth 2 (two) times daily. 180 capsule 3   insulin glargine-yfgn (SEMGLEE) 100 UNIT/ML Pen Inject 40 Units into the skin daily. (Patient taking differently: Inject 35 Units into the skin daily.) 45 mL 3   insulin lispro (HUMALOG KWIKPEN) 100 UNIT/ML KwikPen Inject 10-20 Units into the skin 3 (three) times daily. Per sliding scale     iron polysaccharides (FERREX 150) 150 MG capsule Take 1 capsule (150 mg total) by mouth 2 (two) times daily. 180 capsule 0   lidocaine (LIDODERM) 5 % Place 1 patch onto the skin daily.PLACE 1 PATCH ONTO THE SKIN FOR 12 (TWELVE) HOURS. REMOVE & DISCARD PATCH WITHIN 12 HOURS OR AS DIRECTED BY MD (Patient taking differently: Place 1 patch onto the skin daily as needed.) 60 patch 11   LUTEIN PO Take 1 tablet by mouth daily.     metolazone (ZAROXOLYN) 2.5 MG tablet Take 1 tablet (2.5 mg total) by mouth 3 (three) times a week. 15 tablet 3   montelukast (SINGULAIR) 10 MG tablet TAKE 1 TABLET BY  MOUTH AT BEDTIME. (Patient taking differently: Take 10 mg by mouth at bedtime.) 90 tablet 3   Multiple Vitamin (MULTIVITAMIN WITH MINERALS) TABS tablet Take 1 tablet by mouth daily.     mycophenolate (MYFORTIC) 180 MG EC tablet Take 2 tablets (360 mg total) by mouth 2 (two) times daily. 360 tablet 3   omeprazole (PRILOSEC) 20 MG capsule TAKE 1 CAPSULE BY MOUTH DAILY. (Patient taking differently: Take 20 mg by mouth at bedtime.) 90 capsule 3   predniSONE (DELTASONE) 5 MG tablet Take 1 tablet (5 mg total) by mouth daily. 90 tablet 3   rosuvastatin (CRESTOR) 10 MG tablet Take 1 tablet (10 mg total) by mouth daily. (Patient taking differently: Take 10 mg by mouth at bedtime.) 90 tablet 3   sulfamethoxazole-trimethoprim (BACTRIM) 400-80 MG tablet Take 1 tablet by mouth 3 (three) times a week every Monday, Wednesday, and Friday. 36 tablet 3   tacrolimus (PROGRAF) 1 MG capsule Take 2 capsules (2 mg total) by mouth 2 times daily. 120 capsule 11   tamsulosin (FLOMAX) 0.4 MG CAPS capsule Take 1 capsule (0.4 mg total) by mouth daily. 90 capsule 3   Testosterone 10 MG/ACT (2%) GEL Apply 1 Pump topically daily to inner thighs - 2 pumps total. 60 g 0   torsemide (DEMADEX) 100 MG tablet Take 1 tablet (100 mg total) by mouth 2 (two) times daily. 60 tablet 5   traMADol (ULTRAM) 50 MG tablet Take by mouth.     AMBULATORY NON FORMULARY MEDICATION Trimix (30/1/10)-(Pap/Phent/PGE)  Test Dose  1ml vial   Qty #3 Refills 0  [facility] [phone] Fax [phone] (Patient not taking: Reported on 08/20/2022) 3 mL 0   Continuous Glucose Sensor (FREESTYLE LIBRE 3 SENSOR) MISC Use one every 2 weeks. 6 each 3   cyanocobalamin 100 MCG tablet Take 100 mcg by mouth daily. (Patient not taking: Reported on 08/20/2022)     hydrALAZINE (APRESOLINE) 50 MG tablet Take 1 tablet (50 mg total) by mouth every 8 (eight) hours. 90 tablet 6   HYDROcodone-acetaminophen (NORCO/VICODIN) 5-325 MG tablet Take 1-2 tablets by mouth  every 6 (six) hours  as needed for moderate pain. (Patient not taking: Reported on 08/20/2022) 40 tablet 0   hydrocortisone 2.5 % cream Apply topically 3 (three) times daily as needed. (Patient not taking: Reported on 08/20/2022) 30 g 3   Insulin Pen Needle (UNIFINE PENTIPS) 31G X 5 MM MISC Use 4 times daily as directed 400 each 3   isosorbide mononitrate (IMDUR) 60 MG 24 hr tablet Take 0.5 tablets (30 mg total) by mouth daily. 30 tablet 6   lidocaine (XYLOCAINE) 5 % ointment Apply 1 Application topically 3 (three) times daily as needed. Apply pea size amount to area as needed for pain. (Patient not taking: Reported on 08/20/2022) 50 g 0   sodium zirconium cyclosilicate (LOKELMA) 10 g PACK packet Take 10 g by mouth once for 1 dose. 1 packet 0   zinc sulfate 220 (50 Zn) MG capsule Take 220 mg by mouth daily. (Patient not taking: Reported on 08/20/2022)     No current facility-administered medications for this visit.   Allergies:   Contrast media [iodinated contrast media], Iodine, and Ibuprofen   Social History:  The patient  reports that he quit smoking about 27 years ago. His smoking use included cigars. He has never been exposed to tobacco smoke. He has never used smokeless tobacco. He reports that he does not currently use alcohol. He reports that he does not use drugs.   Family History:  The patient's family history includes Breast cancer in his maternal grandmother; Diabetes in his maternal grandmother; Heart disease in his father; Kidney disease in his father; Kidney failure in his father; Liver cancer in his paternal grandmother and paternal uncle; Valvular heart disease in his mother.   ROS:  Please see the history of present illness.   All other systems are personally reviewed and negative.   Recent Labs: 06/25/2022: Magnesium 2.3 08/05/2022: ALT 14; Platelets 153; TSH 2.077 08/07/2022: Hemoglobin 12.0 08/20/2022: B Natriuretic Peptide 471.8; BUN 80; Creatinine, Ser 2.44; Potassium 6.4; Sodium  140  Personally reviewed   Wt Readings from Last 3 Encounters:  08/20/22 262 lb (118.8 kg)  08/07/22 262 lb (118.8 kg)  08/06/22 263 lb 4 oz (119.4 kg)    BP (!) 155/84   Pulse 70   Ht 5\' 10"  (1.778 m)   Wt 262 lb (118.8 kg)   SpO2 95%   BMI 37.59 kg/m  General: NAD Neck: No JVD, no thyromegaly or thyroid nodule.  Lungs: Clear to auscultation bilaterally with normal respiratory effort. CV: Nondisplaced PMI.  Heart irregular S1/S2, no S3/S4, 1/6 SEM RUSB.  Trace ankle edema.  No carotid bruit.  Normal pedal pulses.  Abdomen: Soft, nontender, no hepatosplenomegaly, no distention.  Skin: Intact without lesions or rashes.  Neurologic: Alert and oriented x 3.  Psych: Normal affect. Extremities: No clubbing or cyanosis.  HEENT: Normal.   Assessment & Plan: 1. Chronic heart failure with Mid Range EF/ With prominent RV failure/pulmonary hypertension:  Ischemic cardiomyopathy.  ?If RV failure is related to severe OSA, he was a remote smoker. Echo in 2/21 with EF 45-50%, moderately dilated RV with moderately decreased systolic function, moderate pulmonary hypertension. Echo in 1/24 showed EF 40-45% , RV mod reduced, RVSP 49 mmHg, no AS, mild MR. Management complicated by CKD stage 3b.  Today, NYHA class II in terms of dyspnea.  He is not volume overloaded on exam.  Weight is stable. K recently low and supplemental K increased.  - Continue torsemide 100 mg bid.  Continue metolazone M/W/F. BMET/BNP today.   -  Continue 80 mEq KCl bid but will need to assess K by BMET today. - Increase hydralazine to 50 mg tid and Imdur to 60 mg daily with elevated BP.  - Continue Farxiga 10 mg daily. Will need to watch closely for further UTIs.  Had recent Serratia pyelonephritis.  - Continue losartan 25 mg daily.  May need to stop if creatinine rises further.  - Continue spironolactone 25 mg daily, check BMET today.  - Tadalafil was stopped due to group 2 and 3 PH (not group 1).  2. Atrial fibrillation:  Chronic. Rate controlled.  - Continue Eliquis.  - Not on beta blocker with history of bradycardia.   3. CAD: S/p CABG 2005.  Cardiolite 12/22 with prior MI, no ischemia.  No chest pain. High threshold for cath given CKD.  - No ASA given Eliquis use.  - Continue Crestor 10 mg daily, good lipids in 2/24. 4. CKD Stage 3b: S/p renal transplantation in 2012. Followed by [facility] center [facility]. He remains on mycophenolate and tacrolimus.    - BMET today. 5. OSA: Continue Bipap every night.  6. HTN: BP elevated, increasing hydralazine/Imdur as above.  7. DM 2: On insulin. He follows with Endocrinology. - Continue dapagliflozin for now but watch for further GU symptoms. 8. Pulmonary hypertension: Primarily pulmonary venous hypertension by RHC in 1/24 though there may be group 3 PH component from OSA.  - Now off tadalafil.  9. Chronic Anemia: has been getting scheduled weekly IV Fe infusions, q Fridays 10. IgG monoclonal protein: Needs followup with his hematologist.    Followup in 2 months.   [name] 08/20/2022

## 2022-08-20 NOTE — Telephone Encounter (Signed)
-----   Message from Laurey Morale, MD sent at 08/20/2022  2:30 PM EDT ----- Stop KCl, stop spironolactone, stop losartan for now.  Give Lokelma 10 g x 1 today.  Recheck BMET tomorrow.  Can restart spironolactone and losartan on Thursday.  Restart KCl at 40 mEeq bid on Thursday.

## 2022-08-21 ENCOUNTER — Ambulatory Visit: Payer: Commercial Managed Care - PPO | Admitting: Oncology

## 2022-08-21 ENCOUNTER — Other Ambulatory Visit (HOSPITAL_COMMUNITY): Payer: Self-pay

## 2022-08-21 ENCOUNTER — Encounter: Payer: Self-pay | Admitting: Oncology

## 2022-08-21 ENCOUNTER — Other Ambulatory Visit: Payer: Self-pay

## 2022-08-21 ENCOUNTER — Telehealth (HOSPITAL_COMMUNITY): Payer: Self-pay

## 2022-08-21 ENCOUNTER — Inpatient Hospital Stay (HOSPITAL_BASED_OUTPATIENT_CLINIC_OR_DEPARTMENT_OTHER): Payer: Commercial Managed Care - PPO | Admitting: Oncology

## 2022-08-21 DIAGNOSIS — D472 Monoclonal gammopathy: Secondary | ICD-10-CM | POA: Diagnosis not present

## 2022-08-21 DIAGNOSIS — N184 Chronic kidney disease, stage 4 (severe): Secondary | ICD-10-CM | POA: Diagnosis not present

## 2022-08-21 DIAGNOSIS — D508 Other iron deficiency anemias: Secondary | ICD-10-CM

## 2022-08-21 DIAGNOSIS — E662 Morbid (severe) obesity with alveolar hypoventilation: Secondary | ICD-10-CM | POA: Diagnosis not present

## 2022-08-21 DIAGNOSIS — D631 Anemia in chronic kidney disease: Secondary | ICD-10-CM

## 2022-08-21 DIAGNOSIS — G4733 Obstructive sleep apnea (adult) (pediatric): Secondary | ICD-10-CM | POA: Diagnosis not present

## 2022-08-21 MED ORDER — LOKELMA 10 G PO PACK
10.0000 g | PACK | Freq: Every day | ORAL | 0 refills | Status: DC
  Filled 2022-08-21: qty 1, 1d supply, fill #0

## 2022-08-21 MED ORDER — TECHLITE PEN NEEDLES 31G X 5 MM MISC
Freq: Four times a day (QID) | 3 refills | Status: DC
  Filled 2022-08-21: qty 300, 75d supply, fill #0
  Filled 2022-10-31: qty 300, 75d supply, fill #1
  Filled 2023-03-24: qty 300, 75d supply, fill #2

## 2022-08-21 MED ORDER — LOKELMA 5 G PO PACK
5.0000 g | PACK | Freq: Every day | ORAL | 0 refills | Status: DC
  Filled 2022-08-21: qty 1, 1d supply, fill #0

## 2022-08-21 NOTE — Telephone Encounter (Signed)
I spoke to patient. He indicated local pharmacy will not have medication until this afternoon and he will get and take today.  He will come for labs tomorrow.

## 2022-08-21 NOTE — Telephone Encounter (Signed)
Patient called and stated pharmacy in [place] could not get medication. However, he is back in town so he requested be sent to [facility] pharmacy, which I sent in this morning. I will confirm with Dr. Shirlee Latch to find out about adjustments to remainder of plan.

## 2022-08-21 NOTE — Telephone Encounter (Signed)
LMOM for him to call back for instructions. Per Dr. Shirlee Latch, he takes [facility] today, rechecks BMET tomorrow.

## 2022-08-22 ENCOUNTER — Other Ambulatory Visit: Payer: Self-pay

## 2022-08-22 ENCOUNTER — Telehealth (HOSPITAL_COMMUNITY): Payer: Self-pay

## 2022-08-22 ENCOUNTER — Other Ambulatory Visit
Admission: RE | Admit: 2022-08-22 | Discharge: 2022-08-22 | Disposition: A | Discharge status: Home / Self Care | Payer: Commercial Managed Care - PPO | Attending: Cardiology | Admitting: Cardiology

## 2022-08-22 DIAGNOSIS — E875 Hyperkalemia: Secondary | ICD-10-CM | POA: Diagnosis not present

## 2022-08-22 DIAGNOSIS — I5022 Chronic systolic (congestive) heart failure: Secondary | ICD-10-CM

## 2022-08-22 LAB — BASIC METABOLIC PANEL
Anion gap: 13 (ref 5–15)
BUN: 80 mg/dL — ABNORMAL HIGH (ref 8–23)
CO2: 27 mmol/L (ref 22–32)
Calcium: 9.4 mg/dL (ref 8.9–10.3)
Chloride: 101 mmol/L (ref 98–111)
Creatinine, Ser: 2.47 mg/dL — ABNORMAL HIGH (ref 0.61–1.24)
GFR, Estimated: 29 mL/min — ABNORMAL LOW (ref >60)
Glucose, Bld: 123 mg/dL — ABNORMAL HIGH (ref 70–99)
Potassium: 3.7 mmol/L (ref 3.5–5.1)
Sodium: 141 mmol/L (ref 135–145)

## 2022-08-22 MED ORDER — LOSARTAN POTASSIUM 50 MG PO TABS
50.0000 mg | ORAL_TABLET | Freq: Every day | ORAL | 3 refills | Status: DC
  Filled 2022-08-22: qty 90, 90d supply, fill #0

## 2022-08-22 MED ORDER — SPIRONOLACTONE 25 MG PO TABS
25.0000 mg | ORAL_TABLET | Freq: Every day | ORAL | 3 refills | Status: DC
  Filled 2022-08-22: qty 90, 90d supply, fill #0

## 2022-08-22 MED ORDER — POTASSIUM CHLORIDE CRYS ER 20 MEQ PO TBCR
40.0000 meq | EXTENDED_RELEASE_TABLET | Freq: Two times a day (BID) | ORAL | 3 refills | Status: DC
  Filled 2022-08-22: qty 90, 23d supply, fill #0

## 2022-08-22 NOTE — Telephone Encounter (Addendum)
Pt aware, agreeable, and verbalized understanding  Labs ordered, med list up dated   ----- Message from Laurey Morale, MD sent at 08/22/2022  9:48 AM EDT ----- K is stable. He can restart losartan and spironolactone. Start lower KCl dosing at 40 bid.  BMET Monday.

## 2022-08-23 ENCOUNTER — Other Ambulatory Visit: Payer: Self-pay | Admitting: Internal Medicine

## 2022-08-23 ENCOUNTER — Other Ambulatory Visit (HOSPITAL_COMMUNITY): Payer: Self-pay | Admitting: Internal Medicine

## 2022-08-23 ENCOUNTER — Other Ambulatory Visit: Payer: Self-pay

## 2022-08-26 ENCOUNTER — Other Ambulatory Visit: Payer: Self-pay

## 2022-08-26 ENCOUNTER — Telehealth: Payer: Self-pay

## 2022-08-26 ENCOUNTER — Encounter: Payer: Self-pay | Admitting: Oncology

## 2022-08-26 ENCOUNTER — Other Ambulatory Visit
Admission: RE | Admit: 2022-08-26 | Discharge: 2022-08-26 | Disposition: A | Discharge status: Home / Self Care | Payer: Commercial Managed Care - PPO | Attending: Cardiology | Admitting: Cardiology

## 2022-08-26 DIAGNOSIS — I5022 Chronic systolic (congestive) heart failure: Secondary | ICD-10-CM | POA: Insufficient documentation

## 2022-08-26 DIAGNOSIS — E1165 Type 2 diabetes mellitus with hyperglycemia: Secondary | ICD-10-CM | POA: Diagnosis not present

## 2022-08-26 DIAGNOSIS — Z794 Long term (current) use of insulin: Secondary | ICD-10-CM | POA: Diagnosis not present

## 2022-08-26 DIAGNOSIS — E1159 Type 2 diabetes mellitus with other circulatory complications: Secondary | ICD-10-CM | POA: Diagnosis not present

## 2022-08-26 DIAGNOSIS — E1142 Type 2 diabetes mellitus with diabetic polyneuropathy: Secondary | ICD-10-CM | POA: Diagnosis not present

## 2022-08-26 DIAGNOSIS — Z940 Kidney transplant status: Secondary | ICD-10-CM | POA: Diagnosis not present

## 2022-08-26 DIAGNOSIS — N184 Chronic kidney disease, stage 4 (severe): Secondary | ICD-10-CM | POA: Diagnosis not present

## 2022-08-26 DIAGNOSIS — I10 Essential (primary) hypertension: Secondary | ICD-10-CM | POA: Diagnosis not present

## 2022-08-26 DIAGNOSIS — E1122 Type 2 diabetes mellitus with diabetic chronic kidney disease: Secondary | ICD-10-CM | POA: Diagnosis not present

## 2022-08-26 DIAGNOSIS — E113593 Type 2 diabetes mellitus with proliferative diabetic retinopathy without macular edema, bilateral: Secondary | ICD-10-CM | POA: Diagnosis not present

## 2022-08-26 DIAGNOSIS — D509 Iron deficiency anemia, unspecified: Secondary | ICD-10-CM | POA: Insufficient documentation

## 2022-08-26 LAB — BASIC METABOLIC PANEL
Anion gap: 16 — ABNORMAL HIGH (ref 5–15)
BUN: 97 mg/dL — ABNORMAL HIGH (ref 8–23)
CO2: 28 mmol/L (ref 22–32)
Calcium: 8.9 mg/dL (ref 8.9–10.3)
Chloride: 96 mmol/L — ABNORMAL LOW (ref 98–111)
Creatinine, Ser: 2.36 mg/dL — ABNORMAL HIGH (ref 0.61–1.24)
GFR, Estimated: 30 mL/min — ABNORMAL LOW (ref >60)
Glucose, Bld: 223 mg/dL — ABNORMAL HIGH (ref 70–99)
Potassium: 3.3 mmol/L — ABNORMAL LOW (ref 3.5–5.1)
Sodium: 140 mmol/L (ref 135–145)

## 2022-08-26 MED ORDER — POTASSIUM CHLORIDE ER 10 MEQ PO TBCR
40.0000 meq | EXTENDED_RELEASE_TABLET | Freq: Two times a day (BID) | ORAL | 3 refills | Status: DC
  Filled 2022-08-26: qty 240, 30d supply, fill #0

## 2022-08-26 MED ORDER — TECHLITE PEN NEEDLES 32G X 6 MM MISC
3 refills | Status: DC
  Filled 2022-08-26 (×2): qty 100, 25d supply, fill #0
  Filled 2022-10-31: qty 100, 25d supply, fill #1

## 2022-08-26 NOTE — Telephone Encounter (Signed)
Pt came to clinic to request having his potassium medication changed from potassium chloride 20 mEq tablets to potassium 10 mEq tablets instead because they come in a coated form for easy swallowing of medication. Spoke with Clarisa Kindred , FNP. Okay to change tablets per pt request. Routing to [facility] for FYI.

## 2022-08-26 NOTE — Progress Notes (Signed)
I connected with [name] on 08/26/22 at  3:15 PM EDT by video enabled telemedicine visit and verified that I am speaking with the correct person using two identifiers.   I discussed the limitations, risks, security and privacy concerns of performing an evaluation and management service by telemedicine and the availability of in-person appointments. I also discussed with the patient that there may be a patient responsible charge related to this service. The patient expressed understanding and agreed to proceed.  Other persons participating in the visit and their role in the encounter:  none  Patient's location:  home Provider's location:  work  Stage manager Complaint: Discuss results of blood work  Diagnosis: Anemia of chronic kidney disease  History of present illness: patient is a 62 year old male with a past medical history is significant for stage IV CKD, ischemic cardiomyopathy, atrial fibrillation, hypertension hyperlipidemia, CAD among other medical problems.  He has undergone kidney transplant back in June 2012.  He is presently on maintenance tacrolimus and mycophenolate.  He has been referred to [name] for anemia.  CBC from 07/08/2022 showed white cell count of 5, H&H of 10.6/37 with an MCV of 82 and a platelet count of 150.  Looking back at his CBC his hemoglobin has been around 10 for the last 3 months.  Up until June 2023 his hemoglobin was close to 12.   Patient has been following up with Dr. Elizabeth Sauer from [facility] neph[facility].  He has been receiving EPO as well as iron injections through them.  He last received IV iron about 3 weeks ago.  He has been receiving EPO injections about once a month and would like to transition over here for EPO and iron    Interval history no acute issues since last visit.   Review of Systems  Constitutional:  Negative for chills, fever, malaise/fatigue and weight loss.  HENT:  Negative for congestion, ear discharge and nosebleeds.   Eyes:  Negative  for blurred vision.  Respiratory:  Negative for cough, hemoptysis, sputum production, shortness of breath and wheezing.   Cardiovascular:  Negative for chest pain, palpitations, orthopnea and claudication.  Gastrointestinal:  Negative for abdominal pain, blood in stool, constipation, diarrhea, heartburn, melena, nausea and vomiting.  Genitourinary:  Negative for dysuria, flank pain, frequency, hematuria and urgency.  Musculoskeletal:  Negative for back pain, joint pain and myalgias.  Skin:  Negative for rash.  Neurological:  Negative for dizziness, tingling, focal weakness, seizures, weakness and headaches.  Endo/Heme/Allergies:  Does not bruise/bleed easily.  Psychiatric/Behavioral:  Negative for depression and suicidal ideas. The patient does not have insomnia.     Allergies  Allergen Reactions   Contrast Media [Iodinated Contrast Media] Other (See Comments)    Kidney transplant   Iodine Other (See Comments)    Other reaction(s): Other (See Comments) Kidney transplant   Ibuprofen Other (See Comments)    Due to kidney transplant Due to kidney transplant Due to kidney transplant    Past Medical History:  Diagnosis Date   Anal condyloma    Anemia    Aortic atherosclerosis (HCC)    Aortic stenosis    a.) TTE 10/2021: Mod AS. AVA 1.06cm^2 (VTI). Mean grad ; b.) TTE 04/02/2022: no AV stenosis   Asthma, persistent controlled 02/25/2013   Attention or concentration deficit 04/26/2021   BPH with obstruction/lower urinary tract symptoms 09/25/2014   CAD (coronary artery disease) 2005   a.) LHC 1996 -> HG stenosis mLAD -> PTCA/PCI with stent x 1 (unk type) ->  complicated by CFA pseudoanurysm (required surg);  b.) LHC 08/10/2002  -> 95% LAD, 70% ISR LAD, 80% pOM, 70% mRCA --> CVTS consult; c.) 4v CABG 08/13/2002; c.) 03/2018 MV: Small, fixed inferoapical and apical sep defect. No isc. EF 47% (60-65% by 05/2018 TTE); d.) 02/2021 MV: small Apical inf fixed defect. No isc -> low risk.    Cardiomegaly    Cellulitis and abscess of left leg 07/22/2020   Chronic atrial fibrillation    a.) CHA2DS2VASc = 4 (CHF, HTN, vascular disease history, T2DM);  b.) rate/rhythm maintained without pharmacological intervention; chronically anticoagulated with apixaban   Chronic combined systolic and diastolic CHF (congestive heart failure)    a.) TTE 7/18 : EF 45-50%; b.) TTE 05/2018 : EF 60-65%, RVSP 74.8; c.) TTE 12/2018: EF 50-55%. Sev dil LA; d.) TTE 04/2019: EF 45-50%; e.) TTE 07/2021: EF 40-45%, G3DD; f.) TTE 10/2021: EF 40-45%, mild conc LVH, septal-lat dyssynchrony (LBBB), mildly red RVSF, mild-mod dil LA, mildly dil RA, mild-mod MS, mod AS; g.) TTE 04/02/2022: EF 40-45%, glob HK, LVH, red RVSF, RVE, sev LAE, mild-mod RAE, mild MR   Chronic venous stasis dermatitis of both lower extremities    Cytomegaloviral disease 2017   Diverticulosis of colon 04/22/2013   Elevated RIGHT hemidiaphragm    Erectile dysfunction    a.) on topical TRT + Trimix (alprostadil/papaverine/phentolamine) injections   ESRD (end stage renal disease) (HCC)    a.) dialysis dependent 2004-2012; b.) s/p cadaveric RIGHT renal transplant 09/13/2010   Essential hypertension 12/17/2010   GERD (gastroesophageal reflux disease)    Gout    History of 2019 novel coronavirus disease (COVID-19) 06/30/2019   History of bilateral cataract extraction 10/2017   History of renal transplant 09/13/2010   a.) s/p cadaveric donor transplant 09/13/2010   Hx of bilateral cataract extraction 10/2017   Hyperlipidemia    Hypogonadism in male 09/25/2014   Ischemic cardiomyopathy    Left cephalic vein thrombosis 06/2019   Long term current use of anticoagulant    a.) apixaban   Long term current use of immunosuppressive drug    a.) mycophenolate + prednisone + tacrolimus   Major depressive disorder 10/04/2013   Mitral stenosis 11/07/2021   a.) TTE 11/07/2021: severe MAC, mild-mod MS (MPG 6 mmHg); b.) TTE 04/02/2022: no MV stenosis    Murmur    Obesity hypoventilation syndrome 03/31/2012   OSA treated with BiPAP 09/29/2012   PAH (pulmonary artery hypertension)    a. 04/2019 RHC: RA 19, RV 80/20, PA 78/31 (51), PCWP 25, CO/CI 7.69/3.26. PVR 3.25 --> Sev PAH, likely primarily PV HTN; b.) RHC 04/04/2022: mRA 13, mPA 40, mPCWP 20, PA sat 59, AO sat 92, CO 7.63, CI 3.26, PVR 2.6 --> mod portal venous HTN   Pancreatitis    S/P CABG x 4 08/13/2002   a.) LIMA-LAD, LRA-OM, SVG-D1, SVG-dRCA   S/P gastric bypass    Synovitis of finger 06/11/2019   Trigger finger, unspecified little finger 12/23/2018   Type 2 diabetes mellitus with hyperglycemia, with long-term current use of insulin 09/09/2014   a.) has FreeStyle Libre CGM   Type 2 MI (myocardial infarction) (HCC)    Ulcer 05/2016   Left shin   Wears glasses     Past Surgical History:  Procedure Laterality Date   CARDIAC CATHETERIZATION N/A 08/10/2002   CATARACT EXTRACTION W/PHACO Right 10/29/2017   Procedure: CATARACT EXTRACTION PHACO AND INTRAOCULAR LENS PLACEMENT (IOC)  RIGHT DIABETIC;  Surgeon: Lockie Mola, MD;  Location: [facility];  Service: Ophthalmology;  Laterality: Right   CATARACT EXTRACTION W/PHACO Left 11/18/2017   Procedure: CATARACT EXTRACTION PHACO AND INTRAOCULAR LENS PLACEMENT (IOC) LEFT IVA/TOPICAL;  Surgeon: Lockie Mola, MD;  Location[facility];  Service: Ophthalmology;  Laterality: Left   COLONOSCOPY WITH PROPOFOL N/A 04/22/2013   Procedure: COLONOSCOPY WITH PROPOFOL;  Surgeon: Rachael Fee, MD;  Location: [facility];  Service: Endoscopy;  Laterality: N/A;   CORONARY ANGIOPLASTY WITH STENT PLACEMENT N/A 1996   CORONARY ARTERY BYPASS GRAFT N/A 08/13/2002   Procedure: 4v CORONARY ARTERY BYPASS GRAFTING; Location: [facility]; Surgeon: Katy Apo, MD   CYSTOSCOPY WITH INSERTION OF UROLIFT N/A 12/25/2021   Procedure: CYSTOSCOPY WITH INSERTION OF UROLIFT;  Surgeon: Riki Altes, MD;  Location: [facility];  Service:  Urology;  Laterality: N/A;   DG ANGIO AV SHUNT*L*     right and left upper arms   FASCIOTOMY  03/03/2012   Procedure: FASCIOTOMY;  Surgeon: Nicki Reaper, MD;  Location: [facility];  Service: Orthopedics;  Laterality: Right;  FASCIOTOMY RIGHT SMALL FINGER   FASCIOTOMY Left 08/17/2013   Procedure: FASCIOTOMY LEFT RING;  Surgeon: Nicki Reaper, MD;  Location: [facility];  Service: Orthopedics;  Laterality: Left;   INCISION AND DRAINAGE ABSCESS Left 10/15/2015   Procedure: INCISION AND DRAINAGE ABSCESS;  Surgeon: Leafy Ro, MD;  Location: [facility];  Service: General;  Laterality: Left;   KIDNEY TRANSPLANT  09/13/2010   cadaver--at [facility] HEART CATH N/A 11/15/2016   Procedure: RIGHT HEART CATH;  Surgeon: Iran Ouch, MD;  Location: [facility];  Service: Cardiovascular;  Laterality: N/A;   RIGHT HEART CATH N/A 05/03/2019   Procedure: RIGHT HEART CATH;  Surgeon: Laurey Morale, MD;  Location: [facility] CV [facility];  Service: Cardiovascular;  Laterality: N/A;   RIGHT HEART CATH N/A 04/04/2022   Procedure: RIGHT HEART CATH;  Surgeon: Laurey Morale, MD;  Location: [facility] IN[facility] CV LAB;  Service: Cardiovascular;  Laterality: N/A;   ROUX-EN-Y GASTRIC BYPASS N/A 2015   TYMPANIC MEMBRANE REPAIR Left 03/2010   VASECTOMY     WART FULGURATION N/A 05/31/2022   Procedure: FULGURATION ANAL WART;  Surgeon: Sung Amabile, DO;  Location: [facility];  Service: General;  Laterality: N/A;    Social History   Socioeconomic History   Marital status: Married    Spouse name: Not on file   Number of children: 2   Years of education: 14   Highest education level: Associate degree: academic program  Occupational History   Occupation: Presenter, broadcasting: unemployed    Comment: disabled due to kidney failure  Tobacco Use   Smoking status: Former    Types: Cigars    Quit date: 09/02/1994    Years since quitting: 28.0    Passive exposure:  Never   Smokeless tobacco: Never  Vaping Use   Vaping Use: Never used  Substance and Sexual Activity   Alcohol use: Not Currently    Comment: occ   Drug use: No   Sexual activity: Not on file  Other Topics Concern   Not on file  Social History Narrative   Has living will   Wife is health care POA   Would accept resuscitation attempts   No tube feedings if cognitively unaware   Social Determinants of Health   Financial Resource Strain: Not on file  Food Insecurity: No Food Insecurity (08/05/2022)   Hunger Vital Sign  Worried About Programme researcher, broadcasting/film/video in the Last Year: Never true    Ran Out of Food in the Last Year: Never true  Transportation Needs: No Transportation Needs (08/05/2022)   PRAPARE - Administrator, Civil Service (Medical): No    Lack of Transportation (Non-Medical): No  Physical Activity: Not on file  Stress: Not on file  Social Connections: Not on file  Intimate Partner Violence: Not At Risk (08/05/2022)   Humiliation, Afraid, Rape, and Kick questionnaire    Fear of Current or Ex-Partner: No    Emotionally Abused: No    Physically Abused: No    Sexually Abused: No    Family History  Problem Relation Age of Onset   Heart disease Father    Kidney failure Father    Kidney disease Father    Diabetes Maternal Grandmother    Breast cancer Maternal Grandmother    Valvular heart disease Mother    Liver cancer Paternal Uncle    Liver cancer Paternal Grandmother    Prostate cancer Neg Hx      Current Outpatient Medications:    albuterol (PROVENTIL) (2.5 MG/3ML) 0.083% nebulizer solution, Take 3 mLs (2.5 mg total) by nebulization every 6 (six) hours as needed for wheezing or shortness of breath., Disp: 150 mL, Rfl: 0   albuterol (VENTOLIN HFA) 108 (90 Base) MCG/ACT inhaler, Inhale 2 puffs into the lungs every 6 (six) hours as needed for wheezing or shortness of breath., Disp: 6.7 g, Rfl: 5   AMBULATORY NON FORMULARY MEDICATION, Trimix  (30/1/10)-(Pap/Phent/PGE)  Test Dose  1ml vial   Qty #3 Refills 0  [facility] [phone] Fax [phone], Disp: 3 mL, Rfl: 0   apixaban (ELIQUIS) 5 MG TABS tablet, Take 1 tablet (5 mg total) by mouth 2 (two) times daily. Hold till 07/10/2022 and restart on 07/11/22, Disp: 180 tablet, Rfl: 1   buPROPion (WELLBUTRIN SR) 150 MG 12 hr tablet, Take 1 tablet (150 mg total) by mouth 2 (two) times daily., Disp: 180 tablet, Rfl: 3   cholecalciferol (VITAMIN D3) 25 MCG (1000 UNIT) tablet, Take 1,000 Units by mouth at bedtime., Disp: , Rfl:    colchicine 0.6 MG tablet, Take 1 tablet (0.6 mg total) by mouth every other day., Disp: 45 tablet, Rfl: 1   Continuous Glucose Sensor (FREESTYLE LIBRE 3 SENSOR) MISC, Use one every 2 weeks., Disp: 6 each, Rfl: 3   cyanocobalamin 100 MCG tablet, Take 100 mcg by mouth daily., Disp: , Rfl:    cyclobenzaprine (FLEXERIL) 5 MG tablet, Take by mouth., Disp: , Rfl:    dapagliflozin propanediol (FARXIGA) 10 MG TABS tablet, Take 1 tablet (10 mg total) by mouth daily before breakfast., Disp: 90 tablet, Rfl: 3   febuxostat (ULORIC) 40 MG tablet, Take 1 tablet (40 mg total) by mouth daily., Disp: 90 tablet, Rfl: 5   fluticasone (FLONASE) 50 MCG/ACT nasal spray, Place 2 sprays into both nostrils daily., Disp: 16 g, Rfl: 11   gabapentin (NEURONTIN) 300 MG capsule, Take 1 capsule (300 mg total) by mouth 2 (two) times daily., Disp: 180 capsule, Rfl: 3   hydrALAZINE (APRESOLINE) 50 MG tablet, Take 1 tablet (50 mg total) by mouth every 8 (eight) hours., Disp: 90 tablet, Rfl: 6   HYDROcodone-acetaminophen (NORCO/VICODIN) 5-325 MG tablet, Take 1-2 tablets by mouth every 6 (six) hours as needed for moderate pain., Disp: 40 tablet, Rfl: 0   hydrocortisone 2.5 % cream, Apply topically 3 (three) times daily as needed., Disp: 30 g,  Rfl: 3   insulin glargine-yfgn (SEMGLEE) 100 UNIT/ML Pen, Inject 40 Units into the skin daily. (Patient taking differently: Inject 35 Units into the skin  daily.), Disp: 45 mL, Rfl: 3   insulin lispro (HUMALOG KWIKPEN) 100 UNIT/ML KwikPen, Inject 10-20 Units into the skin 3 (three) times daily. Per sliding scale, Disp: , Rfl:    Insulin Pen Needle (TECHLITE PEN NEEDLES) 31G X 5 MM MISC, 4 (four) times daily., Disp: 400 each, Rfl: 3   Insulin Pen Needle (UNIFINE PENTIPS) 31G X 5 MM MISC, Use 4 times daily as directed, Disp: 400 each, Rfl: 3   iron polysaccharides (FERREX 150) 150 MG capsule, Take 1 capsule (150 mg total) by mouth 2 (two) times daily., Disp: 180 capsule, Rfl: 0   isosorbide mononitrate (IMDUR) 60 MG 24 hr tablet, Take 0.5 tablets (30 mg total) by mouth daily., Disp: 30 tablet, Rfl: 6   lidocaine (LIDODERM) 5 %, Place 1 patch onto the skin daily.PLACE 1 PATCH ONTO THE SKIN FOR 12 (TWELVE) HOURS. REMOVE & DISCARD PATCH WITHIN 12 HOURS OR AS DIRECTED BY MD (Patient taking differently: Place 1 patch onto the skin daily as needed.), Disp: 60 patch, Rfl: 11   lidocaine (XYLOCAINE) 5 % ointment, Apply 1 Application topically 3 (three) times daily as needed. Apply pea size amount to area as needed for pain., Disp: 50 g, Rfl: 0   LUTEIN PO, Take 1 tablet by mouth daily., Disp: , Rfl:    metolazone (ZAROXOLYN) 2.5 MG tablet, Take 1 tablet (2.5 mg total) by mouth 3 (three) times a week., Disp: 15 tablet, Rfl: 3   montelukast (SINGULAIR) 10 MG tablet, TAKE 1 TABLET BY MOUTH AT BEDTIME. (Patient taking differently: Take 10 mg by mouth at bedtime.), Disp: 90 tablet, Rfl: 3   Multiple Vitamin (MULTIVITAMIN WITH MINERALS) TABS tablet, Take 1 tablet by mouth daily., Disp: , Rfl:    mycophenolate (MYFORTIC) 180 MG EC tablet, Take 2 tablets (360 mg total) by mouth 2 (two) times daily., Disp: 360 tablet, Rfl: 3   omeprazole (PRILOSEC) 20 MG capsule, TAKE 1 CAPSULE BY MOUTH DAILY. (Patient taking differently: Take 20 mg by mouth at bedtime.), Disp: 90 capsule, Rfl: 3   predniSONE (DELTASONE) 5 MG tablet, Take 1 tablet (5 mg total) by mouth daily., Disp: 90  tablet, Rfl: 3   rosuvastatin (CRESTOR) 10 MG tablet, Take 1 tablet (10 mg total) by mouth daily. (Patient taking differently: Take 10 mg by mouth at bedtime.), Disp: 90 tablet, Rfl: 3   sodium zirconium cyclosilicate (LOKELMA) 10 g PACK packet, Take 10 g by mouth daily., Disp: 1 packet, Rfl: 0   sulfamethoxazole-trimethoprim (BACTRIM) 400-80 MG tablet, Take 1 tablet by mouth 3 (three) times a week every Monday, Wednesday, and Friday., Disp: 36 tablet, Rfl: 3   tacrolimus (PROGRAF) 1 MG capsule, Take 2 capsules (2 mg total) by mouth 2 times daily., Disp: 120 capsule, Rfl: 11   tamsulosin (FLOMAX) 0.4 MG CAPS capsule, Take 1 capsule (0.4 mg total) by mouth daily., Disp: 90 capsule, Rfl: 3   Testosterone 10 MG/ACT (2%) GEL, Apply 1 Pump topically daily to inner thighs - 2 pumps total., Disp: 60 g, Rfl: 0   torsemide (DEMADEX) 100 MG tablet, Take 1 tablet (100 mg total) by mouth 2 (two) times daily., Disp: 60 tablet, Rfl: 5   traMADol (ULTRAM) 50 MG tablet, Take by mouth., Disp: , Rfl:    zinc sulfate 220 (50 Zn) MG capsule, Take 220 mg by mouth daily.,  Disp: , Rfl:    losartan (COZAAR) 50 MG tablet, Take 1 tablet (50 mg total) by mouth daily., Disp: 90 tablet, Rfl: 3   potassium chloride SA (KLOR-CON M20) 20 MEQ tablet, Take 2 tablets (40 mEq total) by mouth 2 (two) times daily., Disp: 90 tablet, Rfl: 3   spironolactone (ALDACTONE) 25 MG tablet, Take 1 tablet (25 mg total) by mouth daily., Disp: 90 tablet, Rfl: 3  No results found.  No images are attached to the encounter.      Latest Ref Rng & Units 08/22/2022    9:11 AM  CMP  Glucose 70 - 99 mg/dL 409   BUN 8 - 23 mg/dL 80   Creatinine 8.11 - 1.24 mg/dL 9.14   Sodium 782 - 956 mmol/L 141   Potassium 3.5 - 5.1 mmol/L 3.7   Chloride 98 - 111 mmol/L 101   CO2 22 - 32 mmol/L 27   Calcium 8.9 - 10.3 mg/dL 9.4       Latest Ref Rng & Units 08/07/2022   10:05 AM  CBC  Hemoglobin 13.0 - 17.7 g/dL 21.3   Hematocrit 08.6 - 51.0 % 39.7       Observation/objective: Appears in no acute distress over video visit today.  Breathing is nonlabored  Assessment and plan: Patient is a 36 year oldMale referred for anemia of chronic kidney disease  Discussed the results of blood work with the patient in detail which shows an H&H of 11.7/38.7 with an MCV of 83.  White count and platelets are normal.  Ferritin levels were low at 55 TSH folate normal.  B12 levels normal at 436.  Myeloma panel showed 0.7 g of IgG lambda M protein.  Serum free light chain ratio normal although both kappa and lambda light chain were elevated.  At this point patient does not require EPO since his hemoglobin is more than 11.  In patients with anemia of chronic kidney disease goal is to keep ferritin close to 100.  Will therefore plan to give him Venofer 200 mg IV over the next 2 to 3 weeks.  Discussed risks and benefits of IV iron including all but not limited to possible risk of infusion reaction.  Patient understandsAgrees to proceed as planned.  CBC ferritin and iron studies in 3 and 6 months and I will see him back in 6 months.    Patient has a small amount of M protein IgG lambda which likely constitutes IgG MGUS.  He does not have any evidence of hypercalcemia and his kidney disease has been attributed to hypertension.  I will continue to monitor this every 6 months at this time discussed natural history of MGUS and risk of progression towards multiple myeloma  Follow-up instructions: As above  I discussed the assessment and treatment plan with the patient. The patient was provided an opportunity to ask questions and all were answered. The patient agreed with the plan and demonstrated an understanding of the instructions.   The patient was advised to call back or seek an in-person evaluation if the symptoms worsen or if the condition fails to improve as anticipated.  I provided 12 minutes of face-to-face video visit time during this encounter, and > 50% was  spent counseling as documented under my assessment & plan.  Visit Diagnosis: 1. Anemia of chronic kidney failure, stage 4 (severe) (HCC)   2. MGUS (monoclonal gammopathy of unknown significance)     Dr. Owens Shark, MD, MPH [facility] Tel- [phone]  08/26/2022 8:21 AM

## 2022-08-27 ENCOUNTER — Other Ambulatory Visit (HOSPITAL_COMMUNITY): Payer: Self-pay

## 2022-08-27 ENCOUNTER — Other Ambulatory Visit: Payer: Self-pay

## 2022-08-27 DIAGNOSIS — I5022 Chronic systolic (congestive) heart failure: Secondary | ICD-10-CM

## 2022-08-29 ENCOUNTER — Inpatient Hospital Stay: Payer: Commercial Managed Care - PPO | Attending: Oncology

## 2022-08-29 VITALS — BP 152/87 | HR 56 | Temp 97.8°F | Resp 20

## 2022-08-29 DIAGNOSIS — D649 Anemia, unspecified: Secondary | ICD-10-CM | POA: Diagnosis not present

## 2022-08-29 DIAGNOSIS — D508 Other iron deficiency anemias: Secondary | ICD-10-CM

## 2022-08-29 DIAGNOSIS — Z87891 Personal history of nicotine dependence: Secondary | ICD-10-CM | POA: Insufficient documentation

## 2022-08-29 MED ORDER — SODIUM CHLORIDE 0.9 % IV SOLN
200.0000 mg | INTRAVENOUS | Status: DC
  Administered 2022-08-29: 200 mg via INTRAVENOUS
  Filled 2022-08-29: qty 10

## 2022-08-29 MED ORDER — SODIUM CHLORIDE 0.9 % IV SOLN
Freq: Once | INTRAVENOUS | Status: AC
  Filled 2022-08-29: qty 250

## 2022-08-29 NOTE — Patient Instructions (Signed)

## 2022-08-30 ENCOUNTER — Telehealth (HOSPITAL_COMMUNITY): Payer: Self-pay | Admitting: *Deleted

## 2022-08-30 ENCOUNTER — Other Ambulatory Visit: Payer: Self-pay

## 2022-08-30 DIAGNOSIS — E875 Hyperkalemia: Secondary | ICD-10-CM

## 2022-08-30 DIAGNOSIS — I5022 Chronic systolic (congestive) heart failure: Secondary | ICD-10-CM

## 2022-08-30 MED ORDER — POTASSIUM CHLORIDE ER 10 MEQ PO TBCR
EXTENDED_RELEASE_TABLET | ORAL | 6 refills | Status: DC
Start: 2022-08-30 — End: 2023-02-05
  Filled 2022-08-30: qty 300, 150d supply, fill #0
  Filled 2022-09-13: qty 300, 30d supply, fill #0
  Filled 2022-10-31: qty 300, 30d supply, fill #1
  Filled 2023-01-15: qty 300, 30d supply, fill #2

## 2022-08-30 NOTE — Telephone Encounter (Signed)
Called patient per Dr. Shirlee Latch with following lab results and instructions:  "K is mildly low.  Increase total daily KCl by 20 mEq.  BMET 1 week."  Updated Rx sent, order placed. Patient will go to [place] for lab work on 09/10/22. Asked him to call us if any questions or issues.

## 2022-09-02 ENCOUNTER — Inpatient Hospital Stay: Payer: Commercial Managed Care - PPO

## 2022-09-04 ENCOUNTER — Inpatient Hospital Stay: Payer: Commercial Managed Care - PPO

## 2022-09-06 ENCOUNTER — Inpatient Hospital Stay: Payer: Commercial Managed Care - PPO

## 2022-09-09 ENCOUNTER — Other Ambulatory Visit
Admission: RE | Admit: 2022-09-09 | Discharge: 2022-09-09 | Disposition: A | Discharge status: Home / Self Care | Payer: Commercial Managed Care - PPO | Attending: Cardiology | Admitting: Cardiology

## 2022-09-09 ENCOUNTER — Inpatient Hospital Stay: Payer: Commercial Managed Care - PPO

## 2022-09-09 VITALS — BP 139/78 | HR 68 | Temp 96.7°F | Resp 17

## 2022-09-09 DIAGNOSIS — I5022 Chronic systolic (congestive) heart failure: Secondary | ICD-10-CM | POA: Diagnosis not present

## 2022-09-09 DIAGNOSIS — D649 Anemia, unspecified: Secondary | ICD-10-CM | POA: Diagnosis not present

## 2022-09-09 DIAGNOSIS — D508 Other iron deficiency anemias: Secondary | ICD-10-CM

## 2022-09-09 DIAGNOSIS — E875 Hyperkalemia: Secondary | ICD-10-CM | POA: Insufficient documentation

## 2022-09-09 DIAGNOSIS — Z87891 Personal history of nicotine dependence: Secondary | ICD-10-CM | POA: Diagnosis not present

## 2022-09-09 LAB — BASIC METABOLIC PANEL
Anion gap: 12 (ref 5–15)
BUN: 101 mg/dL — ABNORMAL HIGH (ref 8–23)
CO2: 31 mmol/L (ref 22–32)
Calcium: 9.3 mg/dL (ref 8.9–10.3)
Chloride: 96 mmol/L — ABNORMAL LOW (ref 98–111)
Creatinine, Ser: 2.58 mg/dL — ABNORMAL HIGH (ref 0.61–1.24)
GFR, Estimated: 27 mL/min — ABNORMAL LOW (ref >60)
Glucose, Bld: 81 mg/dL (ref 70–99)
Potassium: 3.8 mmol/L (ref 3.5–5.1)
Sodium: 139 mmol/L (ref 135–145)

## 2022-09-09 MED ORDER — SODIUM CHLORIDE 0.9 % IV SOLN
200.0000 mg | INTRAVENOUS | Status: DC
  Administered 2022-09-09: 200 mg via INTRAVENOUS
  Filled 2022-09-09: qty 200

## 2022-09-09 MED ORDER — SODIUM CHLORIDE 0.9 % IV SOLN
Freq: Once | INTRAVENOUS | Status: AC
  Filled 2022-09-09: qty 250

## 2022-09-10 MED FILL — Iron Sucrose Inj 20 MG/ML (Fe Equiv): INTRAVENOUS | Qty: 10 | Status: AC

## 2022-09-11 ENCOUNTER — Inpatient Hospital Stay: Payer: Commercial Managed Care - PPO

## 2022-09-11 VITALS — BP 139/79 | HR 62 | Temp 96.8°F | Resp 18

## 2022-09-11 DIAGNOSIS — Z87891 Personal history of nicotine dependence: Secondary | ICD-10-CM | POA: Diagnosis not present

## 2022-09-11 DIAGNOSIS — D508 Other iron deficiency anemias: Secondary | ICD-10-CM

## 2022-09-11 DIAGNOSIS — D649 Anemia, unspecified: Secondary | ICD-10-CM | POA: Diagnosis not present

## 2022-09-11 MED ORDER — SODIUM CHLORIDE 0.9 % IV SOLN
200.0000 mg | INTRAVENOUS | Status: DC
  Administered 2022-09-11: 200 mg via INTRAVENOUS
  Filled 2022-09-11: qty 200

## 2022-09-11 MED ORDER — SODIUM CHLORIDE 0.9 % IV SOLN
Freq: Once | INTRAVENOUS | Status: AC
  Filled 2022-09-11: qty 250

## 2022-09-11 NOTE — Progress Notes (Signed)
Patient declined to wait the 30 minutes for post iron infusion observation today. Tolerated infusion well. VSS. 

## 2022-09-12 ENCOUNTER — Other Ambulatory Visit: Payer: Self-pay

## 2022-09-12 ENCOUNTER — Telehealth: Payer: Self-pay | Admitting: Internal Medicine

## 2022-09-12 MED ORDER — PREDNISONE 20 MG PO TABS
40.0000 mg | ORAL_TABLET | Freq: Every day | ORAL | 1 refills | Status: DC
  Filled 2022-09-12: qty 9, 6d supply, fill #0
  Filled 2022-09-13: qty 9, 6d supply, fill #1

## 2022-09-12 MED FILL — Iron Sucrose Inj 20 MG/ML (Fe Equiv): INTRAVENOUS | Qty: 10 | Status: AC

## 2022-09-12 NOTE — Telephone Encounter (Signed)
Spoke to pt

## 2022-09-12 NOTE — Telephone Encounter (Signed)
Patient called in and stated that he is having a bad gout flare up. He was wanting to know if Dr. Alphonsus Sias could send him in some predniSONE (DELTASONE) 5 MG tablet. Thank you!

## 2022-09-12 NOTE — Addendum Note (Signed)
Addended by: [name] I on: 09/12/2022 01:00 PM   Modules accepted: Orders

## 2022-09-13 ENCOUNTER — Other Ambulatory Visit: Payer: Self-pay

## 2022-09-13 ENCOUNTER — Other Ambulatory Visit: Payer: Self-pay | Admitting: Urology

## 2022-09-13 ENCOUNTER — Other Ambulatory Visit (HOSPITAL_COMMUNITY): Payer: Self-pay | Admitting: Cardiology

## 2022-09-13 ENCOUNTER — Inpatient Hospital Stay: Payer: Commercial Managed Care - PPO

## 2022-09-13 MED ORDER — ROSUVASTATIN CALCIUM 10 MG PO TABS
10.0000 mg | ORAL_TABLET | Freq: Every day | ORAL | 3 refills | Status: DC
  Filled 2022-09-13: qty 90, 90d supply, fill #0
  Filled 2023-01-15: qty 90, 90d supply, fill #1
  Filled 2023-03-12 – 2023-04-19 (×2): qty 90, 90d supply, fill #2
  Filled 2023-07-23: qty 90, 90d supply, fill #3

## 2022-09-13 MED FILL — Dapagliflozin Propanediol Tab 10 MG (Base Equivalent): ORAL | 30 days supply | Qty: 30 | Fill #5 | Status: AC

## 2022-09-16 ENCOUNTER — Telehealth: Payer: Self-pay | Admitting: *Deleted

## 2022-09-16 ENCOUNTER — Other Ambulatory Visit: Payer: Self-pay

## 2022-09-16 ENCOUNTER — Other Ambulatory Visit: Payer: Self-pay | Admitting: Urology

## 2022-09-16 ENCOUNTER — Inpatient Hospital Stay: Payer: Commercial Managed Care - PPO

## 2022-09-16 VITALS — BP 142/71 | HR 80 | Temp 96.5°F | Resp 18

## 2022-09-16 DIAGNOSIS — Z87891 Personal history of nicotine dependence: Secondary | ICD-10-CM | POA: Diagnosis not present

## 2022-09-16 DIAGNOSIS — D508 Other iron deficiency anemias: Secondary | ICD-10-CM

## 2022-09-16 DIAGNOSIS — D649 Anemia, unspecified: Secondary | ICD-10-CM | POA: Diagnosis not present

## 2022-09-16 MED ORDER — SODIUM CHLORIDE 0.9 % IV SOLN
200.0000 mg | INTRAVENOUS | Status: DC
  Administered 2022-09-16: 200 mg via INTRAVENOUS
  Filled 2022-09-16: qty 200

## 2022-09-16 MED ORDER — SODIUM CHLORIDE 0.9 % IV SOLN
Freq: Once | INTRAVENOUS | Status: AC
  Filled 2022-09-16: qty 250

## 2022-09-16 NOTE — Telephone Encounter (Signed)
Patient called and left message that he would like to come in to get his iron labs checked and not wait until his appointment in August. He states he needs to find out where his levels are at.   Of note he just had an iron infusion today

## 2022-09-16 NOTE — Progress Notes (Signed)
Pt has been educated and understands. Pt declined to stay 30 mins after iron infusion. VSS.  

## 2022-09-17 ENCOUNTER — Encounter: Payer: Self-pay | Admitting: Oncology

## 2022-09-17 ENCOUNTER — Other Ambulatory Visit: Payer: Self-pay | Admitting: *Deleted

## 2022-09-17 DIAGNOSIS — D508 Other iron deficiency anemias: Secondary | ICD-10-CM

## 2022-09-17 NOTE — Telephone Encounter (Signed)
Responded to patient via mychart

## 2022-09-19 ENCOUNTER — Other Ambulatory Visit: Payer: Self-pay

## 2022-09-20 ENCOUNTER — Other Ambulatory Visit: Payer: Self-pay

## 2022-09-20 ENCOUNTER — Other Ambulatory Visit: Payer: Self-pay | Admitting: Urology

## 2022-09-20 DIAGNOSIS — N400 Enlarged prostate without lower urinary tract symptoms: Secondary | ICD-10-CM

## 2022-09-20 MED FILL — Testosterone TD Gel 10MG/ACT (2%): TRANSDERMAL | 30 days supply | Qty: 60 | Fill #0 | Status: CN

## 2022-09-21 DIAGNOSIS — E662 Morbid (severe) obesity with alveolar hypoventilation: Secondary | ICD-10-CM | POA: Diagnosis not present

## 2022-09-21 DIAGNOSIS — G4733 Obstructive sleep apnea (adult) (pediatric): Secondary | ICD-10-CM | POA: Diagnosis not present

## 2022-09-22 ENCOUNTER — Other Ambulatory Visit: Payer: Self-pay

## 2022-09-23 ENCOUNTER — Other Ambulatory Visit: Payer: Self-pay

## 2022-09-24 ENCOUNTER — Other Ambulatory Visit: Payer: Self-pay

## 2022-09-26 ENCOUNTER — Other Ambulatory Visit: Payer: Self-pay

## 2022-09-30 ENCOUNTER — Other Ambulatory Visit: Payer: Self-pay

## 2022-10-11 ENCOUNTER — Other Ambulatory Visit: Payer: Self-pay

## 2022-10-14 ENCOUNTER — Other Ambulatory Visit: Payer: Self-pay

## 2022-10-14 ENCOUNTER — Ambulatory Visit (INDEPENDENT_AMBULATORY_CARE_PROVIDER_SITE_OTHER): Payer: Commercial Managed Care - PPO | Admitting: Physician Assistant

## 2022-10-14 VITALS — BP 134/68 | HR 58 | Ht 70.0 in | Wt 262.0 lb

## 2022-10-14 DIAGNOSIS — N411 Chronic prostatitis: Secondary | ICD-10-CM

## 2022-10-14 DIAGNOSIS — N451 Epididymitis: Secondary | ICD-10-CM

## 2022-10-14 DIAGNOSIS — R3916 Straining to void: Secondary | ICD-10-CM | POA: Diagnosis not present

## 2022-10-14 DIAGNOSIS — N50812 Left testicular pain: Secondary | ICD-10-CM | POA: Diagnosis not present

## 2022-10-14 DIAGNOSIS — N400 Enlarged prostate without lower urinary tract symptoms: Secondary | ICD-10-CM | POA: Diagnosis not present

## 2022-10-14 DIAGNOSIS — N5089 Other specified disorders of the male genital organs: Secondary | ICD-10-CM | POA: Diagnosis not present

## 2022-10-14 DIAGNOSIS — R3911 Hesitancy of micturition: Secondary | ICD-10-CM

## 2022-10-14 DIAGNOSIS — R32 Unspecified urinary incontinence: Secondary | ICD-10-CM | POA: Diagnosis not present

## 2022-10-14 LAB — BLADDER SCAN AMB NON-IMAGING: Scan Result: 39

## 2022-10-14 MED ORDER — LEVOFLOXACIN 500 MG PO TABS
500.0000 mg | ORAL_TABLET | Freq: Every day | ORAL | 0 refills | Status: DC
Start: 2022-10-14 — End: 2022-10-25
  Filled 2022-10-14: qty 14, 14d supply, fill #0

## 2022-10-14 MED ORDER — TAMSULOSIN HCL 0.4 MG PO CAPS
0.8000 mg | ORAL_CAPSULE | Freq: Every day | ORAL | 0 refills | Status: DC
Start: 2022-10-14 — End: 2022-11-22
  Filled 2022-10-14: qty 60, 30d supply, fill #0

## 2022-10-14 NOTE — Progress Notes (Unsigned)
10/14/2022 3:53 PM   [name] [birth year] [id number]  CC: Chief Complaint Patient presents with  Follow-up  Urinary Frequency  HPI: [name]. is a 62 y.o. male with PMH diabetes on Farxiga, BPH with LUTS s/p UroLift in October 2023, and hypogonadism on testosterone gel who presents today for evaluation of LUTS.   Today he reports a 1 week history of progressive obstructive voiding symptoms including hesitancy and straining as well as left testicular pain.  He denies dysuria, fever, chills, nausea, vomiting, and perineal discomfort.  IPSS 27/terrible as below.  In-office UA today positive for trace glucose; urine microscopy pan negative. PVR 39mL.  IPSS  Row Name 10/14/22 0900 International Prostate Symptom Score How often have you had the sensation of not emptying your bladder? More than half the time How often have you had to urinate less than every two hours? More than half the time How often have you found you stopped and started again several times when you urinated? More than half the time How often have you found it difficult to postpone urination? About half the time How often have you had a weak urinary stream? More than half the time How often have you had to strain to start urination? More than half the time How many times did you typically get up at night to urinate? 4 Times Total IPSS Score 27 Quality of Life due to urinary symptoms If you were to spend the rest of your life with your urinary condition just the way it is now how would you feel about that? Terrible   IPSS  Row Name 10/14/22 0900 International Prostate Symptom Score How often have you had the sensation of not emptying your bladder? More than half the time How often have you had to urinate less than every two hours? More than half the time How often have you found you stopped and started again several times when you urinated? More than half the time How  often have you found it difficult to postpone urination? About half the time How often have you had a weak urinary stream? More than half the time How often have you had to strain to start urination? More than half the time How many times did you typically get up at night to urinate? 4 Times Total IPSS Score 27 Quality of Life due to urinary symptoms If you were to spend the rest of your life with your urinary condition just the way it is now how would you feel about that? Terrible    PMH: Past Medical History: Diagnosis Date  Anal condyloma  Anemia  Aortic atherosclerosis (HCC)  Aortic stenosis a.) TTE 10/2021: Mod AS. AVA 1.06cm^2 (VTI). Mean grad ; b.) TTE 04/02/2022: no AV stenosis  Asthma, persistent controlled 02/25/2013  Attention or concentration deficit 04/26/2021  BPH with obstruction/lower urinary tract symptoms 09/25/2014  CAD (coronary artery disease) 2005 a.) LHC 1996 -> HG stenosis mLAD -> PTCA/PCI with stent x 1 (unk type) -> complicated by CFA pseudoanurysm (required surg);  b.) LHC 08/10/2002  -> 95% LAD, 70% ISR LAD, 80% pOM, 70% mRCA --> CVTS consult; c.) 4v CABG 08/13/2002; c.) 03/2018 MV: Small, fixed inferoapical and apical sep defect. No isc. EF 47% (60-65% by 05/2018 TTE); d.) 02/2021 MV: small Apical inf fixed defect. No isc -> low risk.  Cardiomegaly  Cellulitis and abscess of left leg 07/22/2020  Chronic atrial fibrillation a.) CHA2DS2VASc = 4 (CHF, HTN, vascular disease history, T2DM);  b.)  rate/rhythm maintained without pharmacological intervention; chronically anticoagulated with apixaban  Chronic combined systolic and diastolic CHF (congestive heart failure) a.) TTE 7/18 : EF 45-50%; b.) TTE 05/2018 : EF 60-65%, RVSP 74.8; c.) TTE 12/2018: EF 50-55%. Sev dil LA; d.) TTE 04/2019: EF 45-50%; e.) TTE 07/2021: EF 40-45%, G3DD; f.) TTE 10/2021: EF 40-45%, mild conc LVH, septal-lat dyssynchrony (LBBB), mildly red RVSF, mild-mod dil LA,  mildly dil RA, mild-mod MS, mod AS; g.) TTE 04/02/2022: EF 40-45%, glob HK, LVH, red RVSF, RVE, sev LAE, mild-mod RAE, mild MR  Chronic venous stasis dermatitis of both lower extremities  Cytomegaloviral disease 2017  Diverticulosis of colon 04/22/2013  Elevated RIGHT hemidiaphragm  Erectile dysfunction a.) on topical TRT + Trimix (alprostadil/papaverine/phentolamine) injections  ESRD (end stage renal disease) (HCC) a.) dialysis dependent 2004-2012; b.) s/p cadaveric RIGHT renal transplant 09/13/2010  Essential hypertension 12/17/2010  GERD (gastroesophageal reflux disease)  Gout  History of 2019 novel coronavirus disease (COVID-19) 06/30/2019  History of bilateral cataract extraction 10/2017  History of renal transplant 09/13/2010 a.) s/p cadaveric donor transplant 09/13/2010  Hx of bilateral cataract extraction 10/2017  Hyperlipidemia  Hypogonadism in male 09/25/2014  Ischemic cardiomyopathy  Left cephalic vein thrombosis 06/2019  Long term current use of anticoagulant a.) apixaban  Long term current use of immunosuppressive drug a.) mycophenolate + prednisone + tacrolimus  Major depressive disorder 10/04/2013  Mitral stenosis 11/07/2021 a.) TTE 11/07/2021: severe MAC, mild-mod MS (MPG 6 mmHg); b.) TTE 04/02/2022: no MV stenosis  Murmur  Obesity hypoventilation syndrome 03/31/2012  OSA treated with BiPAP 09/29/2012  PAH (pulmonary artery hypertension) a. 04/2019 RHC: RA 19, RV 80/20, PA 78/31 (51), PCWP 25, CO/CI 7.69/3.26. PVR 3.25 --> Sev PAH, likely primarily PV HTN; b.) RHC 04/04/2022: mRA 13, mPA 40, mPCWP 20, PA sat 59, AO sat 92, CO 7.63, CI 3.26, PVR 2.6 --> mod portal venous HTN  Pancreatitis  S/P CABG x 4 08/13/2002 a.) LIMA-LAD, LRA-OM, SVG-D1, SVG-dRCA  S/P gastric bypass  Synovitis of finger 06/11/2019  Trigger finger, unspecified little finger 12/23/2018  Type 2 diabetes mellitus with hyperglycemia, with long-term  current use of insulin 09/09/2014 a.) has FreeStyle Libre CGM  Type 2 MI (myocardial infarction) (HCC)  Ulcer 05/2016 Left shin  Wears glasses   Surgical History: Past Surgical History: Procedure Laterality Date  CARDIAC CATHETERIZATION N/A 08/10/2002  CATARACT EXTRACTION W/PHACO Right 10/29/2017 Procedure: CATARACT EXTRACTION PHACO AND INTRAOCULAR LENS PLACEMENT (IOC)  RIGHT DIABETIC;  Surgeon: Lockie Mola, MD;  Location: [facility];  Service: Ophthalmology;  Laterality: Right  CATARACT EXTRACTION W/PHACO Left 11/18/2017 Procedure: CATARACT EXTRACTION PHACO AND INTRAOCULAR LENS PLACEMENT (IOC) LEFT IVA/TOPICAL;  Surgeon: Lockie Mola, MD;  Location: [facility];  Service: Ophthalmology;  Laterality: Left  COLONOSCOPY WITH PROPOFOL N/A 04/22/2013 Procedure: COLONOSCOPY WITH PROPOFOL;  Surgeon: Rachael Fee, MD;  Location: [facility];  Service: Endoscopy;  Laterality: N/A;  CORONARY ANGIOPLASTY WITH STENT PLACEMENT N/A 1996  CORONARY ARTERY BYPASS GRAFT N/A 08/13/2002 Procedure: 4v CORONARY ARTERY BYPASS GRAFTING; Location: [facility]; Surgeon: Katy Apo, MD  CYSTOSCOPY WITH INSERTION OF UROLIFT N/A 12/25/2021 Procedure: CYSTOSCOPY WITH INSERTION OF UROLIFT;  Surgeon: Riki Altes, MD;  Location: [facility];  Service: Urology;  Laterality: N/A;  DG ANGIO AV SHUNT*L* right and left upper arms  FASCIOTOMY 03/03/2012 Procedure: FASCIOTOMY;  Surgeon: Nicki Reaper, MD;  Location: [facility];  Service: Orthopedics;  Laterality: Right;  FASCIOTOMY RIGHT SMALL FINGER  FASCIOTOMY Left 08/17/2013 Procedure: FASCIOTOMY LEFT RING;  Surgeon:  Nicki Reaper, MD;  Location: [facility];  Service: Orthopedics;  Laterality: Left;  INCISION AND DRAINAGE ABSCESS Left 10/15/2015 Procedure: INCISION AND DRAINAGE ABSCESS;  Surgeon: Leafy Ro, MD;  Location: [facility];  Service: General;  Laterality:  Left;  KIDNEY TRANSPLANT 09/13/2010 cadaver--at [facility] HEART CATH N/A 11/15/2016 Procedure: RIGHT HEART CATH;  Surgeon: Iran Ouch, MD;  Location: [facility];  Service: Cardiovascular;  Laterality: N/A;  RIGHT HEART CATH N/A 05/03/2019 Procedure: RIGHT HEART CATH;  Surgeon: Laurey Morale, MD;  Location: [facility];  Service: Cardiovascular;  Laterality: N/A;  RIGHT HEART CATH N/A 04/04/2022 Procedure: RIGHT HEART CATH;  Surgeon: Laurey Morale, MD;  Location: [facility] CV [facility];  Service: Cardiovascular;  Laterality: N/A;  ROUX-EN-Y GASTRIC BYPASS N/A 2015  TYMPANIC MEMBRANE REPAIR Left 03/2010  VASECTOMY  WART FULGURATION N/A 05/31/2022 Procedure: FULGURATION ANAL WART;  Surgeon: Sung Amabile, DO;  Location: [facility];  Service: General;  Laterality: N/A;   Home Medications:  Allergies as of 10/14/2022     Reactions Contrast Media [iodinated Contrast Media] Other (See Comments) Kidney transplant Iodine Other (See Comments) Other reaction(s): Other (See Comments) Kidney transplant Ibuprofen Other (See Comments) Due to kidney transplant Due to kidney transplant Due to kidney transplant   Medication List   Accurate as of October 14, 2022  3:53 PM. If you have any questions, ask your nurse or doctor.   albuterol (2.5 MG/3ML) 0.083% nebulizer solution Commonly known as: PROVENTIL Inhale one vial (3 ml) via nebulizer every 6 hours as needed for wheezing or shortness of breath (Take 3 mLs (2.5 mg total) by nebulization every 6 (six) hours as needed for wheezing or shortness of breath.) albuterol 108 (90 Base) MCG/ACT inhaler Commonly known as: VENTOLIN HFA Inhale 2 puffs into the lungs every 6 (six) hours as needed for wheezing or shortness of breath. AMBULATORY NON FORMULARY MEDICATION Trimix (30/1/10)-(Pap/Phent/PGE)  Test Dose  1ml vial   Qty #3 Refills 0  [facility] [phone] Fax  [phone] apixaban 5 MG Tabs tablet Commonly known as: ELIQUIS Take 1 tablet (5 mg total) by mouth 2 (two) times daily. Hold till 07/10/2022 and restart on 07/11/22 buPROPion 150 MG 12 hr tablet Commonly known as: WELLBUTRIN SR Take 1 tablet (150 mg total) by mouth 2 (two) times daily. cholecalciferol 25 MCG (1000 UNIT) tablet Commonly known as: VITAMIN D3 Take 1,000 Units by mouth at bedtime. colchicine 0.6 MG tablet Take 1 tablet (0.6 mg total) by mouth every other day. cyanocobalamin 100 MCG tablet Take 100 mcg by mouth daily. cyclobenzaprine 5 MG tablet Commonly known as: FLEXERIL Take by mouth. Farxiga 10 MG Tabs tablet Generic drug: dapagliflozin propanediol Take 1 tablet (10 mg total) by mouth daily before breakfast. febuxostat 40 MG tablet Commonly known as: ULORIC Take 1 tablet (40 mg total) by mouth daily. Ferrex 150 150 MG capsule Generic drug: iron polysaccharides Take 1 capsule (150 mg total) by mouth 2 (two) times daily. fluticasone 50 MCG/ACT nasal spray Commonly known as: FLONASE Place 2 sprays into both nostrils daily. FreeStyle Libre 3 Sensor Misc Use one every 2 weeks. gabapentin 300 MG capsule Commonly known as: NEURONTIN Take 1 capsule (300 mg total) by mouth 2 (two) times daily. hydrALAZINE 50 MG tablet Commonly known as: APRESOLINE Take 1 tablet (50 mg total) by mouth every 8 (eight) hours. HYDROcodone-acetaminophen 5-325 MG tablet Commonly known as: NORCO/VICODIN Take 1-2 tablets by mouth every 6 (six) hours  as needed for moderate pain. hydrocortisone 2.5 % cream Apply topically 3 (three) times daily as needed. insulin lispro 100 UNIT/ML KwikPen Commonly known as: HumaLOG KwikPen Inject 10-20 Units into the skin 3 (three) times daily. Per sliding scale isosorbide mononitrate 60 MG 24 hr tablet Commonly known as: IMDUR Take 0.5 tablets (30 mg total) by mouth daily. levofloxacin 500 MG tablet Commonly known as: LEVAQUIN Take 1 tablet (500 mg  total) by mouth daily for 14 days. Started by: Carman Ching lidocaine 5 % Commonly known as: LIDODERM Place 1 patch onto the skin daily.PLACE 1 PATCH ONTO THE SKIN FOR 12 (TWELVE) HOURS. REMOVE & DISCARD PATCH WITHIN 12 HOURS OR AS DIRECTED BY MD What changed:  when to take this reasons to take this lidocaine 5 % ointment Commonly known as: XYLOCAINE Apply 1 Application topically 3 (three) times daily as needed. Apply pea size amount to area as needed for pain. What changed: Another medication with the same name was changed. Make sure you understand how and when to take each. Lokelma 10 g Pack packet Generic drug: sodium zirconium cyclosilicate Take 10 g by mouth daily. losartan 50 MG tablet Commonly known as: COZAAR Take 1 tablet (50 mg total) by mouth daily. LUTEIN PO Take 1 tablet by mouth daily. metolazone 2.5 MG tablet Commonly known as: ZAROXOLYN Take 1 tablet (2.5 mg total) by mouth 3 (three) times a week. montelukast 10 MG tablet Commonly known as: SINGULAIR TAKE 1 TABLET BY MOUTH AT BEDTIME. What changed: how much to take multivitamin with minerals Tabs tablet Take 1 tablet by mouth daily. mycophenolate 180 MG EC tablet Commonly known as: MYFORTIC Take 2 tablets (360 mg total) by mouth 2 (two) times daily. omeprazole 20 MG capsule Commonly known as: PRILOSEC TAKE 1 CAPSULE BY MOUTH DAILY. What changed:  how much to take when to take this potassium chloride 10 MEQ tablet Commonly known as: KLOR-CON Take 6 tablets (60 mEq total) by mouth every morning AND 4 tablets (40 mEq total) every evening. predniSONE 5 MG tablet Commonly known as: DELTASONE Take 1 tablet (5 mg total) by mouth daily. predniSONE 20 MG tablet Commonly known as: DELTASONE Take 2 tablets (40 mg total) by mouth daily for 3 days, then 1 tab daily for 3 days rosuvastatin 10 MG tablet Commonly known as: CRESTOR Take 1 tablet (10 mg total) by mouth daily. Semglee (yfgn) 100 UNIT/ML  Pen Generic drug: insulin glargine-yfgn Inject 40 Units into the skin daily. What changed: how much to take spironolactone 25 MG tablet Commonly known as: ALDACTONE Take 1 tablet (25 mg total) by mouth daily. sulfamethoxazole-trimethoprim 400-80 MG tablet Commonly known as: BACTRIM Take 1 tablet by mouth 3 (three) times a week every Monday, Wednesday, and Friday. tacrolimus 1 MG capsule Commonly known as: PROGRAF Take 2 capsules (2 mg total) by mouth 2 times daily. tamsulosin 0.4 MG Caps capsule Commonly known as: FLOMAX Take 1 capsule (0.4 mg total) by mouth daily. What changed: Another medication with the same name was added. Make sure you understand how and when to take each. Changed by: Carman Ching tamsulosin 0.4 MG Caps capsule Commonly known as: FLOMAX Take 2 capsules (0.8 mg total) by mouth daily. What changed: You were already taking a medication with the same name, and this prescription was added. Make sure you understand how and when to take each. Changed by: Carman Ching Testosterone 10 MG/ACT (2%) Gel Apply 1 Pump topically daily to inner thighs - 2 pumps total. torsemide 100 MG  tablet Commonly known as: DEMADEX Take 1 tablet (100 mg total) by mouth 2 (two) times daily. traMADol 50 MG tablet Commonly known as: ULTRAM Take by mouth. Unifine Pentips 31G X 5 MM Misc Generic drug: Insulin Pen Needle Use 4 times daily as directed TechLite Pen Needles 31G X 5 MM Misc Generic drug: Insulin Pen Needle 4 (four) times daily. TechLite Pen Needles 32G X 6 MM Misc Generic drug: Insulin Pen Needle Use 4 times a day zinc sulfate 220 (50 Zn) MG capsule Take 220 mg by mouth daily.    Allergies:  Allergies Allergen Reactions  Contrast Media [Iodinated Contrast Media] Other (See Comments) Kidney transplant  Iodine Other (See Comments) Other reaction(s): Other (See Comments) Kidney transplant  Ibuprofen Other (See Comments) Due to kidney  transplant Due to kidney transplant Due to kidney transplant   Family History: Family History Problem Relation Age of Onset  Heart disease Father  Kidney failure Father  Kidney disease Father  Diabetes Maternal Grandmother  Breast cancer Maternal Grandmother  Valvular heart disease Mother  Liver cancer Paternal Uncle  Liver cancer Paternal Grandmother  Prostate cancer Neg Hx   Social History:   reports that he quit smoking about 28 years ago. His smoking use included cigars. He has never been exposed to tobacco smoke. He has never used smokeless tobacco. He reports that he does not currently use alcohol. He reports that he does not use drugs.  Physical Exam: BP 134/68   Pulse (!) 58   Ht 5\' 10"  (1.778 m)   Wt 262 lb (118.8 kg)   BMI 37.59 kg/m   Constitutional:  Alert and oriented, no acute distress, nontoxic appearing HEENT: [place], AT Cardiovascular: No clubbing, cyanosis, or edema Respiratory: Normal respiratory effort, no increased work of breathing GU: Bilateral descended testicles.  Inferior left epididymis is slightly enlarged and tender. Skin: No rashes, bruises or suspicious lesions Neurologic: Grossly intact, no focal deficits, moving all 4 extremities Psychiatric: Normal mood and affect  Laboratory Data: Results for orders placed or performed in visit on 10/14/22  BLADDER SCAN AMB NON-IMAGING  Result Value Ref Range   Scan Result 39 ml    *Note: Due to a large number of results and/or encounters for the requested time period, some results have not been displayed. A complete set of results can be found in Results Review.   Assessment & Plan:   1. Chronic prostatitis 1 week of progressive obstructive voiding symptoms consistent with more of a prostatitis picture.  Will treat with 2 weeks of Levaquin and increase Flomax to twice daily.  We discussed that if his symptoms have not completely resolved by day 10, he should contact me and we will  extend his antibiotics to 4 weeks. - Urinalysis, Complete - BLADDER SCAN AMB NON-IMAGING - levofloxacin (LEVAQUIN) 500 MG tablet; Take 1 tablet (500 mg total) by mouth daily for 14 days.  Dispense: 14 tablet; Refill: 0 - tamsulosin (FLOMAX) 0.4 MG CAPS capsule; Take 2 capsules (0.8 mg total) by mouth daily.  Dispense: 60 capsule; Refill: 0 - CULTURE, URINE COMPREHENSIVE  2. Epididymitis, left Slight enlargement and tenderness of the inferior left epididymis, suspected combined prostatitis/epididymitis picture today.  Will cover for both with Levaquin as above. - Urinalysis, Complete - BLADDER SCAN AMB NON-IMAGING - levofloxacin (LEVAQUIN) 500 MG tablet; Take 1 tablet (500 mg total) by mouth daily for 14 days.  Dispense: 14 tablet; Refill: 0 - CULTURE, URINE COMPREHENSIVE   Return if symptoms worsen or fail to  improve.  Carman Ching, PA-C  [facility] [address] [place], [place] [address] [phone]

## 2022-10-14 NOTE — Patient Instructions (Signed)
Increase Flomax (tamsulosin) to twice daily for the next 2-4 weeks to help with your difficulty urinating Start Levaquin antibiotic once daily x2 weeks. If you are not completely better by day 10, please call me or send me a MyChart message and we'll extend your antibiotics longer. Please refrain from strenuous activity while on this antibiotic.

## 2022-10-15 ENCOUNTER — Other Ambulatory Visit: Payer: Self-pay | Admitting: Urology

## 2022-10-15 ENCOUNTER — Other Ambulatory Visit: Payer: Self-pay

## 2022-10-15 DIAGNOSIS — Z940 Kidney transplant status: Secondary | ICD-10-CM | POA: Diagnosis not present

## 2022-10-15 DIAGNOSIS — Z7960 Long term (current) use of unspecified immunomodulators and immunosuppressants: Secondary | ICD-10-CM | POA: Diagnosis not present

## 2022-10-15 DIAGNOSIS — M0579 Rheumatoid arthritis with rheumatoid factor of multiple sites without organ or systems involvement: Secondary | ICD-10-CM | POA: Diagnosis not present

## 2022-10-15 DIAGNOSIS — M109 Gout, unspecified: Secondary | ICD-10-CM | POA: Diagnosis not present

## 2022-10-15 DIAGNOSIS — E349 Endocrine disorder, unspecified: Secondary | ICD-10-CM

## 2022-10-15 LAB — URINALYSIS, COMPLETE
Bilirubin, UA: NEGATIVE
Ketones, UA: NEGATIVE
Leukocytes,UA: NEGATIVE
Nitrite, UA: NEGATIVE
Protein,UA: NEGATIVE
RBC, UA: NEGATIVE
Specific Gravity, UA: 1.01 (ref 1.005–1.030)
Urobilinogen, Ur: 0.2 mg/dL (ref 0.2–1.0)
pH, UA: 5 (ref 5.0–7.5)

## 2022-10-15 LAB — MICROSCOPIC EXAMINATION
Bacteria, UA: NONE SEEN
Epithelial Cells (non renal): NONE SEEN /hpf (ref 0–10)
RBC, Urine: NONE SEEN /hpf (ref 0–2)

## 2022-10-15 MED ORDER — TESTOSTERONE 20.25 MG/ACT (1.62%) TD GEL
2.0000 | Freq: Every day | TRANSDERMAL | 3 refills | Status: DC
Start: 2022-10-15 — End: 2023-05-14
  Filled 2022-10-15: qty 150, 60d supply, fill #0
  Filled 2023-01-15 – 2023-02-26 (×2): qty 150, 60d supply, fill #1

## 2022-10-15 MED ORDER — FEBUXOSTAT 40 MG PO TABS
120.0000 mg | ORAL_TABLET | Freq: Every day | ORAL | 5 refills | Status: DC
Start: 2022-10-15 — End: 2023-07-30
  Filled 2022-10-15 – 2022-10-31 (×2): qty 90, 30d supply, fill #0
  Filled 2022-12-20: qty 90, 30d supply, fill #1
  Filled 2023-01-15: qty 90, 30d supply, fill #2
  Filled 2023-03-12 – 2023-03-24 (×2): qty 90, 30d supply, fill #3
  Filled 2023-03-27: qty 30, 30d supply, fill #3
  Filled 2023-04-19: qty 30, 30d supply, fill #4
  Filled 2023-05-05: qty 30, 30d supply, fill #5
  Filled 2023-05-19: qty 90, 30d supply, fill #6
  Filled 2023-06-19: qty 90, 30d supply, fill #7

## 2022-10-15 MED ORDER — COLCHICINE 0.6 MG PO TABS
0.6000 mg | ORAL_TABLET | ORAL | 1 refills | Status: DC
  Filled 2022-10-15: qty 45, 90d supply, fill #0
  Filled 2023-01-15: qty 45, 90d supply, fill #1

## 2022-10-16 ENCOUNTER — Other Ambulatory Visit: Payer: Self-pay

## 2022-10-16 LAB — CULTURE, URINE COMPREHENSIVE

## 2022-10-17 ENCOUNTER — Other Ambulatory Visit: Payer: Self-pay

## 2022-10-17 LAB — CULTURE, URINE COMPREHENSIVE

## 2022-10-21 ENCOUNTER — Ambulatory Visit: Payer: Commercial Managed Care - PPO | Attending: Cardiology | Admitting: Cardiology

## 2022-10-21 ENCOUNTER — Other Ambulatory Visit (HOSPITAL_COMMUNITY): Payer: Self-pay

## 2022-10-21 ENCOUNTER — Other Ambulatory Visit: Payer: Self-pay

## 2022-10-21 VITALS — BP 123/55 | HR 59 | Ht 70.0 in | Wt 262.0 lb

## 2022-10-21 DIAGNOSIS — Z79624 Long term (current) use of inhibitors of nucleotide synthesis: Secondary | ICD-10-CM | POA: Insufficient documentation

## 2022-10-21 DIAGNOSIS — D649 Anemia, unspecified: Secondary | ICD-10-CM | POA: Insufficient documentation

## 2022-10-21 DIAGNOSIS — Z7984 Long term (current) use of oral hypoglycemic drugs: Secondary | ICD-10-CM | POA: Insufficient documentation

## 2022-10-21 DIAGNOSIS — I255 Ischemic cardiomyopathy: Secondary | ICD-10-CM | POA: Diagnosis not present

## 2022-10-21 DIAGNOSIS — E662 Morbid (severe) obesity with alveolar hypoventilation: Secondary | ICD-10-CM | POA: Diagnosis not present

## 2022-10-21 DIAGNOSIS — I272 Pulmonary hypertension, unspecified: Secondary | ICD-10-CM | POA: Insufficient documentation

## 2022-10-21 DIAGNOSIS — Z955 Presence of coronary angioplasty implant and graft: Secondary | ICD-10-CM | POA: Insufficient documentation

## 2022-10-21 DIAGNOSIS — Z940 Kidney transplant status: Secondary | ICD-10-CM | POA: Insufficient documentation

## 2022-10-21 DIAGNOSIS — Z833 Family history of diabetes mellitus: Secondary | ICD-10-CM | POA: Diagnosis not present

## 2022-10-21 DIAGNOSIS — M109 Gout, unspecified: Secondary | ICD-10-CM | POA: Insufficient documentation

## 2022-10-21 DIAGNOSIS — Z79899 Other long term (current) drug therapy: Secondary | ICD-10-CM | POA: Diagnosis not present

## 2022-10-21 DIAGNOSIS — I482 Chronic atrial fibrillation, unspecified: Secondary | ICD-10-CM | POA: Diagnosis not present

## 2022-10-21 DIAGNOSIS — Z91041 Radiographic dye allergy status: Secondary | ICD-10-CM | POA: Insufficient documentation

## 2022-10-21 DIAGNOSIS — G4733 Obstructive sleep apnea (adult) (pediatric): Secondary | ICD-10-CM | POA: Insufficient documentation

## 2022-10-21 DIAGNOSIS — E1122 Type 2 diabetes mellitus with diabetic chronic kidney disease: Secondary | ICD-10-CM | POA: Insufficient documentation

## 2022-10-21 DIAGNOSIS — Z7689 Persons encountering health services in other specified circumstances: Secondary | ICD-10-CM | POA: Diagnosis not present

## 2022-10-21 DIAGNOSIS — I252 Old myocardial infarction: Secondary | ICD-10-CM | POA: Insufficient documentation

## 2022-10-21 DIAGNOSIS — I2510 Atherosclerotic heart disease of native coronary artery without angina pectoris: Secondary | ICD-10-CM | POA: Insufficient documentation

## 2022-10-21 DIAGNOSIS — Z7901 Long term (current) use of anticoagulants: Secondary | ICD-10-CM | POA: Insufficient documentation

## 2022-10-21 DIAGNOSIS — N1832 Chronic kidney disease, stage 3b: Secondary | ICD-10-CM | POA: Insufficient documentation

## 2022-10-21 DIAGNOSIS — Z951 Presence of aortocoronary bypass graft: Secondary | ICD-10-CM | POA: Insufficient documentation

## 2022-10-21 DIAGNOSIS — Z794 Long term (current) use of insulin: Secondary | ICD-10-CM | POA: Insufficient documentation

## 2022-10-21 DIAGNOSIS — I5022 Chronic systolic (congestive) heart failure: Secondary | ICD-10-CM | POA: Diagnosis not present

## 2022-10-21 DIAGNOSIS — I130 Hypertensive heart and chronic kidney disease with heart failure and stage 1 through stage 4 chronic kidney disease, or unspecified chronic kidney disease: Secondary | ICD-10-CM | POA: Diagnosis not present

## 2022-10-21 DIAGNOSIS — Z8249 Family history of ischemic heart disease and other diseases of the circulatory system: Secondary | ICD-10-CM | POA: Diagnosis not present

## 2022-10-21 DIAGNOSIS — Z9884 Bariatric surgery status: Secondary | ICD-10-CM | POA: Diagnosis not present

## 2022-10-21 DIAGNOSIS — Z79621 Long term (current) use of calcineurin inhibitor: Secondary | ICD-10-CM | POA: Insufficient documentation

## 2022-10-21 DIAGNOSIS — Z87891 Personal history of nicotine dependence: Secondary | ICD-10-CM | POA: Insufficient documentation

## 2022-10-21 MED ORDER — LOSARTAN POTASSIUM 25 MG PO TABS
25.0000 mg | ORAL_TABLET | Freq: Every day | ORAL | 3 refills | Status: DC
  Filled 2022-10-21: qty 90, 90d supply, fill #0
  Filled 2023-01-15: qty 90, 90d supply, fill #1

## 2022-10-21 MED ORDER — APIXABAN 5 MG PO TABS
5.0000 mg | ORAL_TABLET | Freq: Two times a day (BID) | ORAL | 3 refills | Status: DC
  Filled 2022-10-21: qty 180, 90d supply, fill #0

## 2022-10-21 MED ORDER — APIXABAN 5 MG PO TABS
5.0000 mg | ORAL_TABLET | Freq: Two times a day (BID) | ORAL | 3 refills | Status: DC
  Filled 2022-10-21: qty 180, 90d supply, fill #0
  Filled 2023-01-15: qty 180, 90d supply, fill #1
  Filled 2023-03-12 – 2023-04-19 (×2): qty 180, 90d supply, fill #2
  Filled 2023-07-23: qty 180, 90d supply, fill #3

## 2022-10-21 MED ORDER — DAPAGLIFLOZIN PROPANEDIOL 10 MG PO TABS: 10.0000 mg | ORAL_TABLET | Freq: Every day | ORAL | 3 refills | Status: DC

## 2022-10-21 MED ORDER — DAPAGLIFLOZIN PROPANEDIOL 10 MG PO TABS
10.0000 mg | ORAL_TABLET | Freq: Every day | ORAL | 3 refills | Status: DC
  Filled 2022-10-21 (×2): qty 90, 90d supply, fill #0
  Filled 2023-01-15: qty 90, 90d supply, fill #1
  Filled 2023-03-12 – 2023-04-19 (×2): qty 90, 90d supply, fill #2
  Filled 2023-07-23: qty 90, 90d supply, fill #3

## 2022-10-21 MED FILL — Dapagliflozin Propanediol Tab 10 MG (Base Equivalent): ORAL | 60 days supply | Qty: 60 | Fill #6 | Status: CN

## 2022-10-21 NOTE — Patient Instructions (Addendum)
DECREASE LOSARTAN to 25 MG daily  Go downstairs to LOWER LEVEL and have blood work drawn  Your Marcelline Deist and Eliquis were refilled for 90 day supplies with 1 year's worth of refills  Follow up in 2 months

## 2022-10-21 NOTE — Telephone Encounter (Signed)
Meds ordered this encounter  Medications   dapagliflozin propanediol (FARXIGA) 10 MG TABS tablet    Sig: Take 1 tablet (10 mg total) by mouth daily before breakfast.    Dispense:  90 tablet    Refill:  3   apixaban (ELIQUIS) 5 MG TABS tablet    Sig: Take 1 tablet (5 mg total) by mouth 2 (two) times daily.    Dispense:  180 tablet    Refill:  3

## 2022-10-21 NOTE — Progress Notes (Signed)
Advanced Heart Failure Clinic Note   Date:  10/21/2022   ID:  [name]., DOB 26-Jun-1960, MRN [id number]   Provider location: [address], [place] Type of Visit:  Established patient  PCP:  [name], MD  Cardiologist:  Dr. Kirke Corin Nephrology: Dr. Yetta [name]  HF Cardiologist: Dr. Shirlee Latch  HPI: [name]. is a 62 y.o. male  with a history of CAD s/p CABG 2005, chronic atrial fibrillation on Eliquis, ESRD s/p renal transplant 2012, anc chronic diastolic CHF.  His renal function has been fairly stable, most recent creatinine 1.43.  He has been struggling with volume overload/CHF.  He has a history of ischemic cardiomyopathy with EF as low as 35-40% in the past but echo in 3/20 showed EF 60-65%.  There is evidence for RV failure with pulmonary hypertension by echo.    Admitted 11/05/18 low grade fever and shortness of breath. HS Trop negative. Covid 19 negative. Blood CX negative. Placed on antibiotics and discharged to home the next day.   Admitted 05/03/19 with A/C Diastolic HF. Diuresed with IV lasix and transitioned to torsemide 80/40 mg . Discharge weight 252 pounds.   Admitted 06/2019 with left arm pain. Left upper extremity venous Doppler revealed occluded cephalic vein at the AV fistula outflow tract in the antecubital fossa with no other thrombus identified. Vascular evaluated. He continued on Eliquis.  No plan for intervention. Also treated for acute gout flare. Discharged on prednisone taper.     Given IV lasix on 12/21 by HF Paramedicine.   Admitted to [facility] 2/22 with acute gout flare vs cellulitis. Nephrology was consulted to evaluate transplant regimen and follow along. He was treated with antibiotics and prednisone. Discharged on decreased torsemide dose of 40 mg daily. Weight 259 lbs.  Admitted 4/22 for left leg cellulitis after sustaining a dog scratch, received IV antibiotics. No changes to his diuretic therapy. Discharge weight 266  lbs.  Follow up with Dr. Kirke Corin, 1/23, he had CP and underwent Lexiscan myoview, which overall was a low risk study with evidence of prior infarct in the infero-apical area with no significant change from before. Losartan added to GDMT regimen.  Follow up 5/23, volume overloaded, REDs 40%. Advised taking additional metolazone (3 doses that week), however labs showed worsening renal function and advised to keep at 2 doses.  Admitted in 5/23 with acute on chronic CHF and AKI.  Wonda Cerise and losartan stopped. Diuresed with IV lasix. Echo showed EF 40-45%, RV mildly reduced, GIIIDD (restrictive), IVC dilated. Nephrology consulted for diuretic dosing. Discharged home, weight 266 lbs.  He had chest pain in 8/23 and was admitted at [facility], minimal HS-TnI elevation with no trend, thought to be demand ischemia from volume overload.    Echo 8/23 showing EF 40-45%, diffuse hypokinesis with dyssynchrony, mildly dilated RV with mildly decreased systolic function, IVC dilated, moderate AS with mean gradient 15, mild-moderate mitral stenosis with mean gradient 6.   Follow up 03/27/22, volume overloaded with 25 lbs weight gain. Instructed to use Furoscix x 3 days, then resume home dose of torsemide + metolazone. Clinic weight 277 lbs.  Admitted from [facility] 04/01/22 with a/c CHF. Echo showed EF 40-45%, RV moderately reduced and RVSP 49 mmHg. RHC showed elevated filling pressures, moderate primarily pulmonary venous hypertension and preserved CO. Tadalafil stopped with primarily PH group 2 and 3. He was diuresed with IV lasix and metolazone, and GDMT added back. Overall down 19 lbs. He was  discharged home, weight 255 lbs.  Patient was admitted in 3/24 with Serratia bacteremia/pyelonephritis, treated with ertapenem.  He also had campylobacter diarrhea.  In 4/24, he tripped getting off his riding lawnmower and fell, fracturing ribs. He had a pulmonary contusion with this.   Today he returns for HF follow up. He is taking  torsemide 100 mg bid with metolazone 3 days/week. He went to the beach recently and was able to fish without problems.  He had an episode of lightheadedness last Friday during his walk, did not pass out.  SBP was 80s when he checked, usually SBP runs in 100s.  Weight stable.  No dyspnea walking on flat ground.  He has been going to the gym.  Mild dyspnea walking up a hill.  No exertional chest pain.  Using CPAP.   ECG (personally reviewed): Atrial fibrillation, IVCD 152 msec  Labs (7/19): LDL 49 Labs (2/20): K 3.7, creatinine 1.43 Labs (4/20): K 4.3, creatinine 1.24 Labs (11/06/18): K 3.7 creatinine 1.23  Labs (05/07/19): K 3.8 Creatinine 1.5  Labs (07/18/19): K 4.3 Creatinine 1.42  Labs (5/21): K 3.3, creatinine 1.21 Labs (11/21): K 3.6, creatinine 1.4 Labs (12/21): K 3.3, creatinine 2.4, hgb 12.8 Labs (04/01/19): K 3.3 Creatinine 2.2  Labs (8/22): K 4.0, creatinine 1.6, HDL 35, LDL 36, a1c 7.5 Labs (2/23): LDL 31, HDL 36 Labs (4/23): K 3.3, creatinine 1.68 Labs (5/23): K 4.1, creatinine 2.24 Labs (8/23): K 3.5, creatinine 2.36 Labs (12/23): K 3.6, creatinine 1.99, hgb 9.3 Labs (1/24): K 4.6, creatinine 2.44 Labs (2/24): K 3.4, creatinine 2.75, LDL 25 Labs (4/24): K 4.1, creatinine 2.46 Labs (5/24): K 2.7, creatinine 2.25, IgG monoclonal protein on myeloma panel.  Labs (6/24): K 3.8, creatinine 2.58  PMH: 1. CAD: s/p CABG x 4 in [place] in 2005.  - Cardiolite (1/20): EF 47%, small fixed apical septal defect, no ischemia.  Low risk study.  - Cardiolite (12/22): small apical inferior defect, no changes from prior, no ischemia. Low risk study. 2. Asthma 3. Gout 4. Atrial fibrillation: Chronic.  5. ESRD s/p renal transplantation at [facility] in 2012.  6. Anemia of chronic disease 7. Type II diabetes 8. HTN 9. Hyperlipidemia 10. OSA: On bipap.  11. Ischemic cardiomyopathy: EF 35-40% in past.  - Cardiolite (1/20) with EF 47%.  - Echo (1/20): EF 50-55%, mild MR, RV moderately dilated  with moderately decreased systolic function, PASP 73 mmHg, moderate TR.   - Echo (3/20): EF 60-65%, PASP 75 mmHg, mildly dilated RV with mildly decreased systolic function.  - Echo (2/21): EF 45-50%, global HK, moderately dilated RV with moderately decreased systolic function, D-shaped interventricular septum, dilated IVC.  - RHC (2/21): mean RA 19, PA 78/31 mean 51, mean PCWP 26, CI 3.26, PVR 3.25 - Echo (5/23): EF 40-45%, RV mildly reduced, GIIIDD (restrictive) - Echo (8/23): EF 40-45%, diffuse hypokinesis with dyssynchrony, mildly dilated RV with mildly decreased systolic function, IVC dilated, moderate AS with mean gradient 15, mild-moderate mitral stenosis with mean gradient 6.  - Echo (1/24): Echo showed EF 40-45%, RV moderately reduced and RVSP 49 mmHg, mild MR, no AS, dilated IVC.  - RHC (1/24): RA mean 13, PA 62/24 (mean 40), PCWP mean 20, CO/CI (Fick) 7.63/3.26, PVR 2.6 WU, PAPi 2.9 12. Obesity: s/p gastric bypass in 2015.  13. Bradycardia with beta blocker.  14. Aortic stenosis: Moderate on 8/23 echo.  15. Mitral stenosis: Mild to moderate on 8/23 echo.  16. Rheumatoid arthritis 17. IgG monoclonal gammopathy  Past  Surgical History:  Procedure Laterality Date   CARDIAC CATHETERIZATION N/A 08/10/2002   CATARACT EXTRACTION W/PHACO Right 10/29/2017   Procedure: CATARACT EXTRACTION PHACO AND INTRAOCULAR LENS PLACEMENT (IOC)  RIGHT DIABETIC;  Surgeon: Lockie Mola, MD;  Location: [facility];  Service: Ophthalmology;  Laterality: Right   CATARACT EXTRACTION W/PHACO Left 11/18/2017   Procedure: CATARACT EXTRACTION PHACO AND INTRAOCULAR LENS PLACEMENT (IOC) LEFT IVA/TOPICAL;  Surgeon: Lockie Mola, MD;  Location: [facility];  Service: Ophthalmology;  Laterality: Left   COLONOSCOPY WITH PROPOFOL N/A 04/22/2013   Procedure: COLONOSCOPY WITH PROPOFOL;  Surgeon: Rachael Fee, MD;  Location: [facility];  Service: Endoscopy;  Laterality: N/A;   CORONARY  ANGIOPLASTY WITH STENT PLACEMENT N/A 1996   CORONARY ARTERY BYPASS GRAFT N/A 08/13/2002   Procedure: 4v CORONARY ARTERY BYPASS GRAFTING; Location: [facility]; Surgeon: Katy Apo, MD   CYSTOSCOPY WITH INSERTION OF UROLIFT N/A 12/25/2021   Procedure: CYSTOSCOPY WITH INSERTION OF UROLIFT;  Surgeon: Riki Altes, MD;  Location: [facility];  Service: Urology;  Laterality: N/A;   DG ANGIO AV SHUNT*L*     right and left upper arms   FASCIOTOMY  03/03/2012   Procedure: FASCIOTOMY;  Surgeon: Nicki Reaper, MD;  Location: [facility];  Service: Orthopedics;  Laterality: Right;  FASCIOTOMY RIGHT SMALL FINGER   FASCIOTOMY Left 08/17/2013   Procedure: FASCIOTOMY LEFT RING;  Surgeon: Nicki Reaper, MD;  Location: [facility];  Service: Orthopedics;  Laterality: Left;   INCISION AND DRAINAGE ABSCESS Left 10/15/2015   Procedure: INCISION AND DRAINAGE ABSCESS;  Surgeon: Leafy Ro, MD;  Location: [facility];  Service: General;  Laterality: Left;   KIDNEY TRANSPLANT  09/13/2010   cadaver--at [facility] HEART CATH N/A 11/15/2016   Procedure: RIGHT HEART CATH;  Surgeon: Iran Ouch, MD;  Location: [facility];  Service: Cardiovascular;  Laterality: N/A;   RIGHT HEART CATH N/A 05/03/2019   Procedure: RIGHT HEART CATH;  Surgeon: Laurey Morale, MD;  Location: [facility] CV [facility];  Service: Cardiovascular;  Laterality: N/A;   RIGHT HEART CATH N/A 04/04/2022   Procedure: RIGHT HEART CATH;  Surgeon: Laurey Morale, MD;  Location: [facility] INVASIVE CV LAB;  Service: Cardiovascular;  Laterality: N/A;   ROUX-EN-Y GASTRIC BYPASS N/A 2015   TYMPANIC MEMBRANE REPAIR Left 03/2010   VASECTOMY     WART FULGURATION N/A 05/31/2022   Procedure: FULGURATION ANAL WART;  Surgeon: Sung Amabile, DO;  Location: [facility];  Service: General;  Laterality: N/A;   Current Outpatient Medications  Medication Sig Dispense Refill   albuterol (PROVENTIL) (2.5 MG/3ML) 0.083% nebulizer solution  Take 3 mLs (2.5 mg total) by nebulization every 6 (six) hours as needed for wheezing or shortness of breath. 150 mL 0   albuterol (VENTOLIN HFA) 108 (90 Base) MCG/ACT inhaler Inhale 2 puffs into the lungs every 6 (six) hours as needed for wheezing or shortness of breath. 6.7 g 5   AMBULATORY NON FORMULARY MEDICATION Trimix (30/1/10)-(Pap/Phent/PGE)  Test Dose  1ml vial   Qty #3 Refills 0  [facility] [phone] Fax [phone] 3 mL 0   buPROPion (WELLBUTRIN SR) 150 MG 12 hr tablet Take 1 tablet (150 mg total) by mouth 2 (two) times daily. 180 tablet 3   cholecalciferol (VITAMIN D3) 25 MCG (1000 UNIT) tablet Take 1,000 Units by mouth at bedtime.     colchicine 0.6 MG tablet Take 1 tablet (0.6 mg total) by mouth every other  day. 45 tablet 1   colchicine 0.6 MG tablet Take 1 tablet (0.6 mg total) by mouth every other day. 45 tablet 1   Continuous Glucose Sensor (FREESTYLE LIBRE 3 SENSOR) MISC Use one every 2 weeks. 6 each 3   cyanocobalamin 100 MCG tablet Take 100 mcg by mouth daily.     cyclobenzaprine (FLEXERIL) 5 MG tablet Take by mouth.     febuxostat (ULORIC) 40 MG tablet Take 1 tablet (40 mg total) by mouth daily. 90 tablet 5   febuxostat (ULORIC) 40 MG tablet Take 3 tablets (120 mg total) by mouth daily. 90 tablet 5   fluticasone (FLONASE) 50 MCG/ACT nasal spray Place 2 sprays into both nostrils daily. 16 g 11   gabapentin (NEURONTIN) 300 MG capsule Take 1 capsule (300 mg total) by mouth 2 (two) times daily. 180 capsule 3   hydrALAZINE (APRESOLINE) 50 MG tablet Take 1 tablet (50 mg total) by mouth every 8 (eight) hours. 90 tablet 6   HYDROcodone-acetaminophen (NORCO/VICODIN) 5-325 MG tablet Take 1-2 tablets by mouth every 6 (six) hours as needed for moderate pain. 40 tablet 0   hydrocortisone 2.5 % cream Apply topically 3 (three) times daily as needed. 30 g 3   insulin glargine-yfgn (SEMGLEE) 100 UNIT/ML Pen Inject 40 Units into the skin daily. (Patient taking differently:  Inject 35 Units into the skin daily.) 45 mL 3   insulin lispro (HUMALOG KWIKPEN) 100 UNIT/ML KwikPen Inject 10-20 Units into the skin 3 (three) times daily. Per sliding scale     Insulin Pen Needle (TECHLITE PEN NEEDLES) 31G X 5 MM MISC 4 (four) times daily. 400 each 3   Insulin Pen Needle (TECHLITE PEN NEEDLES) 32G X 6 MM MISC Use 4 times a day 100 each 3   Insulin Pen Needle (UNIFINE PENTIPS) 31G X 5 MM MISC Use 4 times daily as directed 400 each 3   iron polysaccharides (FERREX 150) 150 MG capsule Take 1 capsule (150 mg total) by mouth 2 (two) times daily. 180 capsule 0   isosorbide mononitrate (IMDUR) 60 MG 24 hr tablet Take 0.5 tablets (30 mg total) by mouth daily. 30 tablet 6   levofloxacin (LEVAQUIN) 500 MG tablet Take 1 tablet (500 mg total) by mouth daily for 14 days. 14 tablet 0   lidocaine (LIDODERM) 5 % Place 1 patch onto the skin daily.PLACE 1 PATCH ONTO THE SKIN FOR 12 (TWELVE) HOURS. REMOVE & DISCARD PATCH WITHIN 12 HOURS OR AS DIRECTED BY MD (Patient taking differently: Place 1 patch onto the skin daily as needed.) 60 patch 11   lidocaine (XYLOCAINE) 5 % ointment Apply 1 Application topically 3 (three) times daily as needed. Apply pea size amount to area as needed for pain. 50 g 0   losartan (COZAAR) 25 MG tablet Take 1 tablet (25 mg total) by mouth daily. 90 tablet 3   LUTEIN PO Take 1 tablet by mouth daily.     metolazone (ZAROXOLYN) 2.5 MG tablet Take 1 tablet (2.5 mg total) by mouth 3 (three) times a week. 15 tablet 3   montelukast (SINGULAIR) 10 MG tablet TAKE 1 TABLET BY MOUTH AT BEDTIME. (Patient taking differently: Take 10 mg by mouth at bedtime.) 90 tablet 3   Multiple Vitamin (MULTIVITAMIN WITH MINERALS) TABS tablet Take 1 tablet by mouth daily.     mycophenolate (MYFORTIC) 180 MG EC tablet Take 2 tablets (360 mg total) by mouth 2 (two) times daily. 360 tablet 3   omeprazole (PRILOSEC) 20 MG capsule TAKE  1 CAPSULE BY MOUTH DAILY. (Patient taking differently: Take 20 mg by  mouth at bedtime.) 90 capsule 3   potassium chloride (KLOR-CON) 10 MEQ tablet Take 6 tablets (60 mEq total) by mouth every morning AND 4 tablets (40 mEq total) every evening. 300 tablet 6   predniSONE (DELTASONE) 20 MG tablet Take 2 tablets (40 mg total) by mouth daily for 3 days, then 1 tab daily for 3 days 9 tablet 1   predniSONE (DELTASONE) 5 MG tablet Take 1 tablet (5 mg total) by mouth daily. 90 tablet 3   rosuvastatin (CRESTOR) 10 MG tablet Take 1 tablet (10 mg total) by mouth daily. 90 tablet 3   sodium zirconium cyclosilicate (LOKELMA) 10 g PACK packet Take 10 g by mouth daily. 1 packet 0   spironolactone (ALDACTONE) 25 MG tablet Take 1 tablet (25 mg total) by mouth daily. 90 tablet 3   sulfamethoxazole-trimethoprim (BACTRIM) 400-80 MG tablet Take 1 tablet by mouth 3 (three) times a week every Monday, Wednesday, and Friday. 36 tablet 3   tacrolimus (PROGRAF) 1 MG capsule Take 2 capsules (2 mg total) by mouth 2 times daily. 120 capsule 11   tamsulosin (FLOMAX) 0.4 MG CAPS capsule Take 1 capsule (0.4 mg total) by mouth daily. 90 capsule 3   tamsulosin (FLOMAX) 0.4 MG CAPS capsule Take 2 capsules (0.8 mg total) by mouth daily. 60 capsule 0   Testosterone 20.25 MG/ACT (1.62%) GEL Place 2 Pump onto the skin daily. 150 g 3   torsemide (DEMADEX) 100 MG tablet Take 1 tablet (100 mg total) by mouth 2 (two) times daily. 60 tablet 5   traMADol (ULTRAM) 50 MG tablet Take by mouth.     zinc sulfate 220 (50 Zn) MG capsule Take 220 mg by mouth daily.     apixaban (ELIQUIS) 5 MG TABS tablet Take 1 tablet (5 mg total) by mouth 2 (two) times daily. 180 tablet 3   dapagliflozin propanediol (FARXIGA) 10 MG TABS tablet Take 1 tablet (10 mg total) by mouth daily before breakfast. 90 tablet 3   No current facility-administered medications for this visit.   Allergies:   Contrast media [iodinated contrast media], Iodine, and Ibuprofen   Social History:  The patient  reports that he quit smoking about 28 years  ago. His smoking use included cigars. He has never been exposed to tobacco smoke. He has never used smokeless tobacco. He reports that he does not currently use alcohol. He reports that he does not use drugs.   Family History:  The patient's family history includes Breast cancer in his maternal grandmother; Diabetes in his maternal grandmother; Heart disease in his father; Kidney disease in his father; Kidney failure in his father; Liver cancer in his paternal grandmother and paternal uncle; Valvular heart disease in his mother.   ROS:  Please see the history of present illness.   All other systems are personally reviewed and negative.   Recent Labs: 06/25/2022: Magnesium 2.3 08/05/2022: ALT 14; Platelets 153; TSH 2.077 08/07/2022: Hemoglobin 12.0 08/20/2022: B Natriuretic Peptide 471.8 09/09/2022: BUN 101; Creatinine, Ser 2.58; Potassium 3.8; Sodium 139  Personally reviewed   Wt Readings from Last 3 Encounters:  10/21/22 262 lb (118.8 kg)  10/14/22 262 lb (118.8 kg)  08/20/22 262 lb (118.8 kg)    BP (!) 123/55   Pulse (!) 59   Ht 5\' 10"  (1.778 m)   Wt 262 lb (118.8 kg)   SpO2 99%   BMI 37.59 kg/m  General: NAD Neck:  No JVD, no thyromegaly or thyroid nodule.  Lungs: Clear to auscultation bilaterally with normal respiratory effort. CV: Nondisplaced PMI.  Heart irregular S1/S2, no S3/S4, no murmur.  No peripheral edema.  No carotid bruit.  Normal pedal pulses.  Abdomen: Soft, nontender, no hepatosplenomegaly, no distention.  Skin: Intact without lesions or rashes.  Neurologic: Alert and oriented x 3.  Psych: Normal affect. Extremities: No clubbing or cyanosis.  HEENT: Normal.   Assessment & Plan: 1. Chronic heart failure with Mid Range EF/ With prominent RV failure/pulmonary hypertension:  Ischemic cardiomyopathy.  ?If RV failure is related to severe OSA, he was a remote smoker. Echo in 2/21 with EF 45-50%, moderately dilated RV with moderately decreased systolic function, moderate  pulmonary hypertension. Echo in 1/24 showed EF 40-45% , RV mod reduced, RVSP 49 mmHg, no AS, mild MR. Management complicated by CKD stage 3b.  Today, NYHA class II in terms of dyspnea.  He is not volume overloaded by exam, weight stable.  - Continue torsemide 100 mg bid.  Continue metolazone M/W/F. BMET/BNP today.   - Continue KCl 60 qam/40 qpm.   - Continue hydralazine 50 mg tid and Imdur 30 mg daily.  - Continue Farxiga 10 mg daily. Will need to watch closely for further UTIs.  Had recent Serratia pyelonephritis.  - With episode of low BP and elevated creatinine, I will decrease losartan to 25 mg daily.    - Continue spironolactone 25 mg daily, check BMET today.  - Tadalafil was stopped due to group 2 and 3 PH (not group 1).  2. Atrial fibrillation: Chronic. Rate controlled.  - Continue Eliquis.  - Not on beta blocker with history of bradycardia.   3. CAD: S/p CABG 2005.  Cardiolite 12/22 with prior MI, no ischemia.  No chest pain. High threshold for cath given CKD.  - No ASA given Eliquis use.  - Continue Crestor 10 mg daily, good lipids in 2/24. 4. CKD Stage 3b: S/p renal transplantation in 2012. Followed by [facility] center at [facility]. He remains on mycophenolate and tacrolimus.    - BMET today. 5. OSA: Continue Bipap every night.  6. HTN: BP controlled today but has been low at times with lightheadedness.  - Decrease losartan to 25 mg daily.   7. DM 2: On insulin. He follows with Endocrinology. - Continue dapagliflozin for now but watch for further GU symptoms. 8. Pulmonary hypertension: Primarily pulmonary venous hypertension by RHC in 1/24 though there may be group 3 PH component from OSA.  - Now off tadalafil.  9. Chronic Anemia: has been getting scheduled weekly IV Fe infusions, q Fridays 10. IgG monoclonal protein: Needs followup with his hematologist.    Followup in 2 months.   Marca Ancona 10/21/2022

## 2022-10-24 ENCOUNTER — Telehealth: Payer: Self-pay | Admitting: Physician Assistant

## 2022-10-24 ENCOUNTER — Telehealth (HOSPITAL_COMMUNITY): Payer: Self-pay

## 2022-10-24 DIAGNOSIS — N411 Chronic prostatitis: Secondary | ICD-10-CM

## 2022-10-24 DIAGNOSIS — N451 Epididymitis: Secondary | ICD-10-CM

## 2022-10-24 MED ORDER — METOLAZONE 2.5 MG PO TABS: 2.5000 mg | ORAL_TABLET | ORAL | Status: DC

## 2022-10-24 NOTE — Telephone Encounter (Addendum)
Pt aware, agreeable, and verbalized understanding   ----- Message from Marca Ancona sent at 10/24/2022 11:54 AM EDT ----- Creatinine and BUN higher.  Hold torsemide and KCl for 1 day then resume.   Do not take metolazone this Friday.  Decrease metolazone to twice weekly Mondays and Fridays starting next week.

## 2022-10-24 NOTE — Telephone Encounter (Signed)
Patient called to request refill for Levofloxacin 500 mg tablet. Pharmacy is [facility].

## 2022-10-24 NOTE — Addendum Note (Signed)
Addended by: Linda Hedges on: 10/24/2022 02:28 PM   Modules accepted: Orders

## 2022-10-25 ENCOUNTER — Inpatient Hospital Stay: Payer: Commercial Managed Care - PPO | Attending: Oncology

## 2022-10-25 ENCOUNTER — Other Ambulatory Visit: Payer: Self-pay

## 2022-10-25 DIAGNOSIS — D649 Anemia, unspecified: Secondary | ICD-10-CM | POA: Diagnosis not present

## 2022-10-25 DIAGNOSIS — D508 Other iron deficiency anemias: Secondary | ICD-10-CM

## 2022-10-25 LAB — CBC WITH DIFFERENTIAL (CANCER CENTER ONLY)
Abs Immature Granulocytes: 0.02 10*3/uL (ref 0.00–0.07)
Basophils Absolute: 0 10*3/uL (ref 0.0–0.1)
Basophils Relative: 0 %
Eosinophils Absolute: 0 10*3/uL (ref 0.0–0.5)
Eosinophils Relative: 1 %
HCT: 39.7 % (ref 39.0–52.0)
Hemoglobin: 12.8 g/dL — ABNORMAL LOW (ref 13.0–17.0)
Immature Granulocytes: 0 %
Lymphocytes Relative: 14 %
Lymphs Abs: 0.7 10*3/uL (ref 0.7–4.0)
MCH: 28.1 pg (ref 26.0–34.0)
MCHC: 32.2 g/dL (ref 30.0–36.0)
MCV: 87.3 fL (ref 80.0–100.0)
Monocytes Absolute: 0.3 10*3/uL (ref 0.1–1.0)
Monocytes Relative: 6 %
Neutro Abs: 3.8 10*3/uL (ref 1.7–7.7)
Neutrophils Relative %: 79 %
Platelet Count: 112 10*3/uL — ABNORMAL LOW (ref 150–400)
RBC: 4.55 MIL/uL (ref 4.22–5.81)
RDW: 16 % — ABNORMAL HIGH (ref 11.5–15.5)
WBC Count: 4.9 10*3/uL (ref 4.0–10.5)
nRBC: 0 % (ref 0.0–0.2)

## 2022-10-25 LAB — IRON AND TIBC
Iron: 83 ug/dL (ref 45–182)
Saturation Ratios: 30 % (ref 17.9–39.5)
TIBC: 277 ug/dL (ref 250–450)
UIBC: 194 ug/dL

## 2022-10-25 LAB — FERRITIN: Ferritin: 169 ng/mL (ref 24–336)

## 2022-10-25 MED ORDER — LEVOFLOXACIN 500 MG PO TABS
500.0000 mg | ORAL_TABLET | Freq: Every day | ORAL | 0 refills | Status: DC
  Filled 2022-10-25: qty 14, 14d supply, fill #0

## 2022-10-25 NOTE — Telephone Encounter (Signed)
Med sent.

## 2022-10-28 DIAGNOSIS — Z79899 Other long term (current) drug therapy: Secondary | ICD-10-CM | POA: Diagnosis not present

## 2022-10-28 DIAGNOSIS — Z23 Encounter for immunization: Secondary | ICD-10-CM | POA: Diagnosis not present

## 2022-10-28 DIAGNOSIS — D849 Immunodeficiency, unspecified: Secondary | ICD-10-CM | POA: Diagnosis not present

## 2022-10-28 DIAGNOSIS — Z940 Kidney transplant status: Secondary | ICD-10-CM | POA: Diagnosis not present

## 2022-10-30 ENCOUNTER — Encounter: Payer: Self-pay | Admitting: Oncology

## 2022-10-31 ENCOUNTER — Other Ambulatory Visit: Payer: Self-pay

## 2022-10-31 MED FILL — Omeprazole Cap Delayed Release 20 MG: ORAL | 90 days supply | Qty: 90 | Fill #3 | Status: AC

## 2022-11-01 ENCOUNTER — Other Ambulatory Visit: Payer: Self-pay

## 2022-11-01 ENCOUNTER — Telehealth (HOSPITAL_COMMUNITY): Payer: Self-pay | Admitting: Cardiology

## 2022-11-01 ENCOUNTER — Emergency Department: Payer: Commercial Managed Care - PPO

## 2022-11-01 ENCOUNTER — Emergency Department
Admission: EM | Admit: 2022-11-01 | Discharge: 2022-11-01 | Disposition: A | Discharge status: Home / Self Care | Payer: Commercial Managed Care - PPO | Attending: Emergency Medicine | Admitting: Emergency Medicine

## 2022-11-01 DIAGNOSIS — M7989 Other specified soft tissue disorders: Secondary | ICD-10-CM | POA: Diagnosis not present

## 2022-11-01 DIAGNOSIS — E860 Dehydration: Secondary | ICD-10-CM | POA: Diagnosis not present

## 2022-11-01 DIAGNOSIS — I517 Cardiomegaly: Secondary | ICD-10-CM | POA: Diagnosis not present

## 2022-11-01 DIAGNOSIS — R079 Chest pain, unspecified: Secondary | ICD-10-CM | POA: Insufficient documentation

## 2022-11-01 DIAGNOSIS — M79605 Pain in left leg: Secondary | ICD-10-CM | POA: Insufficient documentation

## 2022-11-01 DIAGNOSIS — Z951 Presence of aortocoronary bypass graft: Secondary | ICD-10-CM | POA: Diagnosis not present

## 2022-11-01 DIAGNOSIS — R0689 Other abnormalities of breathing: Secondary | ICD-10-CM | POA: Diagnosis not present

## 2022-11-01 DIAGNOSIS — R0789 Other chest pain: Secondary | ICD-10-CM | POA: Diagnosis not present

## 2022-11-01 DIAGNOSIS — R42 Dizziness and giddiness: Secondary | ICD-10-CM | POA: Diagnosis not present

## 2022-11-01 DIAGNOSIS — R0602 Shortness of breath: Secondary | ICD-10-CM | POA: Diagnosis not present

## 2022-11-01 DIAGNOSIS — I4891 Unspecified atrial fibrillation: Secondary | ICD-10-CM | POA: Diagnosis not present

## 2022-11-01 LAB — CBC
HCT: 40.4 % (ref 39.0–52.0)
Hemoglobin: 13.4 g/dL (ref 13.0–17.0)
MCH: 28.6 pg (ref 26.0–34.0)
MCHC: 33.2 g/dL (ref 30.0–36.0)
MCV: 86.1 fL (ref 80.0–100.0)
Platelets: 113 10*3/uL — ABNORMAL LOW (ref 150–400)
RBC: 4.69 MIL/uL (ref 4.22–5.81)
RDW: 15.5 % (ref 11.5–15.5)
WBC: 7.1 10*3/uL (ref 4.0–10.5)
nRBC: 0 % (ref 0.0–0.2)

## 2022-11-01 LAB — BASIC METABOLIC PANEL
Anion gap: 11 (ref 5–15)
BUN: 93 mg/dL — ABNORMAL HIGH (ref 8–23)
CO2: 28 mmol/L (ref 22–32)
Calcium: 8.8 mg/dL — ABNORMAL LOW (ref 8.9–10.3)
Chloride: 97 mmol/L — ABNORMAL LOW (ref 98–111)
Creatinine, Ser: 2.6 mg/dL — ABNORMAL HIGH (ref 0.61–1.24)
GFR, Estimated: 27 mL/min — ABNORMAL LOW (ref >60)
Glucose, Bld: 145 mg/dL — ABNORMAL HIGH (ref 70–99)
Potassium: 3.4 mmol/L — ABNORMAL LOW (ref 3.5–5.1)
Sodium: 136 mmol/L (ref 135–145)

## 2022-11-01 LAB — TROPONIN I (HIGH SENSITIVITY)
Troponin I (High Sensitivity): 67 ng/L — ABNORMAL HIGH (ref <18)
Troponin I (High Sensitivity): 49 ng/L — ABNORMAL HIGH (ref <18)

## 2022-11-01 MED ORDER — SODIUM CHLORIDE 0.9 % IV BOLUS
500.0000 mL | Freq: Once | INTRAVENOUS | Status: AC
  Administered 2022-11-01: 500 mL via INTRAVENOUS

## 2022-11-01 NOTE — Discharge Instructions (Signed)
Please seek medical attention for any high fevers, chest pain, shortness of breath, change in behavior, persistent vomiting, bloody stool or any other new or concerning symptoms.  

## 2022-11-01 NOTE — Telephone Encounter (Signed)
Patient called to report low b/p readings throughout the day. 75-60/58 Reports he feels faint Increased dizziness Very tired Home alone Took AM meds   Above reviewed with Shanda Bumps [place],NP Pt should report to ER for further evaluation via 911  Returned call to patient Aware he should contact 911 however pt became slightly unresponsive  Only able to respond with grunts   [facility] 911 call placed to [employer] at [phone] -paramedics on the way   contacted pt to notify help is on the way @ [phone] (during the call sheriff and para medics on the scene)

## 2022-11-01 NOTE — ED Triage Notes (Signed)
Pt to ED [facility] from home for dizziness and shob started today. +chest pain that has subsided. Also c/o upper back pain. Hx of afib.  Hypotensive with ems. 20g to R FA, NS given PTA.

## 2022-11-01 NOTE — ED Provider Notes (Signed)
[facility] Provider Note    Event Date/Time   First MD Initiated Contact with Patient 11/01/22 1849     (approximate)   History   Chest Pain and Dizziness   HPI  [name]. is a 62 y.o. male presents to the emergency department today because of concerns for dizziness and chest pain.  Patient states that symptoms started primarily today although did feel little off yesterday.  Today the chest pain was located in the center part of his chest.  He denies any radiation to his neck or arms.  This was accompanied by shortness of breath.  The patient additionally has complaints of left leg pain and swelling.     Physical Exam   Triage Vital Signs: ED Triage Vitals  Encounter Vitals Group     BP 11/01/22 1556 111/60     Systolic BP Percentile --      Diastolic BP Percentile --      Pulse Rate 11/01/22 1556 73     Resp 11/01/22 1553 20     Temp 11/01/22 1553 98.2 F (36.8 C)     Temp src --      SpO2 --      Weight 11/01/22 1554 252 lb (114.3 kg)     Height 11/01/22 1554 5\' 10"  (1.778 m)     Head Circumference --      Peak Flow --      Pain Score 11/01/22 1554 6     Pain Loc --      Pain Education --      Exclude from Growth Chart --     Most recent vital signs: Vitals:   11/01/22 1553 11/01/22 1556  BP:  111/60  Pulse:  73  Resp: 20   Temp: 98.2 F (36.8 C)    General: Awake, alert, oriented. CV:  Good peripheral perfusion. Regular rate and rhythm. Resp:  Normal effort. Lungs clear. Abd:  No distention.  Other:  Left leg with pain to palpation of inner thigh. Some bruising noticed.   ED Results / Procedures / Treatments   Labs (all labs ordered are listed, but only abnormal results are displayed) Labs Reviewed  BASIC METABOLIC PANEL - Abnormal; Notable for the following components:      Result Value   Potassium 3.4 (*)    Chloride 97 (*)    Glucose, Bld 145 (*)    BUN 93 (*)    Creatinine, Ser 2.60 (*)    Calcium  8.8 (*)    GFR, Estimated 27 (*)    All other components within normal limits  CBC - Abnormal; Notable for the following components:   Platelets 113 (*)    All other components within normal limits  TROPONIN I (HIGH SENSITIVITY) - Abnormal; Notable for the following components:   Troponin I (High Sensitivity) 67 (*)    All other components within normal limits  TROPONIN I (HIGH SENSITIVITY)     EKG  I, Phineas Semen, attending physician, personally viewed and interpreted this EKG  EKG Time: 1600 Rate: 78 Rhythm: atrial fibrillation  Axis: rightward axis deviation Intervals: qtc 524 QRS: IVCD ST changes: no st elevation Impression: abnormal ekg   RADIOLOGY I independently interpreted and visualized the CXR. My interpretation: No pneumonia Radiology interpretation:  IMPRESSION:  No active cardiopulmonary disease.     PROCEDURES:  Critical Care performed: N   MEDICATIONS ORDERED IN ED: Medications - No data to display   IMPRESSION /  MDM / ASSESSMENT AND PLAN / ED COURSE  I reviewed the triage vital signs and the nursing notes.                              Differential diagnosis includes, but is not limited to, ACS, pneumonia, PE, DVT  Patient's presentation is most consistent with acute presentation with potential threat to life or bodily function.   The patient is on the cardiac monitor to evaluate for evidence of arrhythmia and/or significant heart rate changes.  Patient presented to the emergency department today because of concerns for dizziness, chest pain and some shortness of breath.  Also complaining of some leg pain.  Patient is afebrile here.  Blood work did show initial elevated troponin however patient has history of elevated troponins.  Repeat is normal.  Given concern for possible DVT will obtain ultrasound.  DVT here without any concerning abnormality.  Patient did feel better after IV fluids.  Do wonder if patient might be overdiuresis.  I  discussed this with the patient.  Given clinical improvement and reassuring workup I think it is reasonable for patient be discharged.  Discussed importance of follow-up.     FINAL CLINICAL IMPRESSION(S) / ED DIAGNOSES   Final diagnoses:  Dizziness  Dehydration    Note:  This document was prepared using Dragon voice recognition software and may include unintentional dictation errors.    Phineas Semen, MD 11/01/22 [phone]

## 2022-11-04 ENCOUNTER — Ambulatory Visit (INDEPENDENT_AMBULATORY_CARE_PROVIDER_SITE_OTHER): Payer: Commercial Managed Care - PPO | Admitting: Internal Medicine

## 2022-11-04 ENCOUNTER — Encounter: Payer: Self-pay | Admitting: Internal Medicine

## 2022-11-04 ENCOUNTER — Telehealth: Payer: Self-pay | Admitting: Internal Medicine

## 2022-11-04 ENCOUNTER — Telehealth: Payer: Self-pay

## 2022-11-04 ENCOUNTER — Other Ambulatory Visit: Payer: Self-pay

## 2022-11-04 VITALS — BP 102/60 | HR 66 | Temp 97.1°F | Ht 70.0 in | Wt 250.0 lb

## 2022-11-04 DIAGNOSIS — E1121 Type 2 diabetes mellitus with diabetic nephropathy: Secondary | ICD-10-CM | POA: Diagnosis not present

## 2022-11-04 DIAGNOSIS — Z0000 Encounter for general adult medical examination without abnormal findings: Secondary | ICD-10-CM | POA: Diagnosis not present

## 2022-11-04 DIAGNOSIS — M79604 Pain in right leg: Secondary | ICD-10-CM

## 2022-11-04 DIAGNOSIS — M79606 Pain in leg, unspecified: Secondary | ICD-10-CM | POA: Insufficient documentation

## 2022-11-04 DIAGNOSIS — F39 Unspecified mood [affective] disorder: Secondary | ICD-10-CM

## 2022-11-04 DIAGNOSIS — M79605 Pain in left leg: Secondary | ICD-10-CM

## 2022-11-04 DIAGNOSIS — Z794 Long term (current) use of insulin: Secondary | ICD-10-CM | POA: Diagnosis not present

## 2022-11-04 DIAGNOSIS — I482 Chronic atrial fibrillation, unspecified: Secondary | ICD-10-CM

## 2022-11-04 DIAGNOSIS — I5042 Chronic combined systolic (congestive) and diastolic (congestive) heart failure: Secondary | ICD-10-CM

## 2022-11-04 LAB — HM DIABETES FOOT EXAM

## 2022-11-04 NOTE — Assessment & Plan Note (Signed)
Working with Dr Judi Saa increased, farxiga 5mg , rapid premeal insulin 10-20 tid

## 2022-11-04 NOTE — Assessment & Plan Note (Addendum)
Seems to be compensated on losartan 25, metolozone, torsemide 100 bid Isosorbide and hydralazine as well

## 2022-11-04 NOTE — Progress Notes (Signed)
Subjective:    Patient ID: [name]., male    DOB: 1960-07-08, 62 y.o.   MRN: [id number]  HPI Here for Medicare wellness visit and follow up of chronic health conditions Reviewed advanced directives Reviewed other doctors---Dr McLean--cardiology, Dr Cloria Spring, [facility] transplant team, Dr Patel--rheumatology, Dr Solum--endocrine, Dr Stoioff--urology, Dr Tally Joe, Dr Richardson Landry, Dr Doren Custard, Dr Zada Girt, [facility], Dr Ranae Palms, [facility] Hospitalized for sepsis-- this spring. . Soon after fell and broke ribs. CHF in January. Only surgery was removal of anogenital warts a few months ago One fall with injury No tobacco Rare alcohol No depression but gets frustrated with all his medical stuff Vision is okay Hearing aides generally help Can do shopping and housework--in general (not doing well with leg currently) No sig memory issues  Called cardiologist last week Passed out---EMS came BP was low Checked out at ER---got IV fluids. Evaluated for chest pain Leg swelling---ultrasounds did not show DVT Last GFR at hospital 27--this is fairly stable Still having trouble with left leg--hard to even bear weight Felt like a cramp last week in thigh---"hurt like hell". Now all black and blue and is moving his way down leg Feels worse now No problem if just sitting  Sugars have been up and down  Last A1c up to 9% Semglee increased to 40--from 35 Lots of "stress on my body" Aching and hurting a lot  On prednisone for transplant Uses it for gout flares also Is on uloric--sees Dr Allena Katz  Chest pain before the ER---troponins negative Not regularly No palpitations Some dizziness--ongoing. This seems to be worse since losartan increased (even though still cut from 50-25) No edema  Current Outpatient Medications on File Prior to Visit  Medication Sig Dispense Refill   albuterol (PROVENTIL) (2.5 MG/3ML) 0.083% nebulizer solution Take 3 mLs  (2.5 mg total) by nebulization every 6 (six) hours as needed for wheezing or shortness of breath. 150 mL 0   albuterol (VENTOLIN HFA) 108 (90 Base) MCG/ACT inhaler Inhale 2 puffs into the lungs every 6 (six) hours as needed for wheezing or shortness of breath. 6.7 g 5   apixaban (ELIQUIS) 5 MG TABS tablet Take 1 tablet (5 mg total) by mouth 2 (two) times daily. 180 tablet 3   buPROPion (WELLBUTRIN SR) 150 MG 12 hr tablet Take 1 tablet (150 mg total) by mouth 2 (two) times daily. 180 tablet 3   cholecalciferol (VITAMIN D3) 25 MCG (1000 UNIT) tablet Take 1,000 Units by mouth at bedtime.     colchicine 0.6 MG tablet Take 1 tablet (0.6 mg total) by mouth every other day. 45 tablet 1   Continuous Glucose Sensor (FREESTYLE LIBRE 3 SENSOR) MISC Use one every 2 weeks. 6 each 3   cyclobenzaprine (FLEXERIL) 5 MG tablet Take by mouth.     dapagliflozin propanediol (FARXIGA) 10 MG TABS tablet Take 1 tablet (10 mg total) by mouth daily before breakfast. 90 tablet 3   febuxostat (ULORIC) 40 MG tablet Take 3 tablets (120 mg total) by mouth daily. 90 tablet 5   fluticasone (FLONASE) 50 MCG/ACT nasal spray Place 2 sprays into both nostrils daily. 16 g 11   gabapentin (NEURONTIN) 300 MG capsule Take 1 capsule (300 mg total) by mouth 2 (two) times daily. 180 capsule 3   hydrALAZINE (APRESOLINE) 50 MG tablet Take 1 tablet (50 mg total) by mouth every 8 (eight) hours. 90 tablet 6   hydrocortisone 2.5 % cream Apply topically 3 (three) times daily as needed. 30 g  3   insulin glargine-yfgn (SEMGLEE) 100 UNIT/ML Pen Inject 40 Units into the skin daily. 45 mL 3   insulin lispro (HUMALOG KWIKPEN) 100 UNIT/ML KwikPen Inject 10-20 Units into the skin 3 (three) times daily. Per sliding scale     Insulin Pen Needle (TECHLITE PEN NEEDLES) 31G X 5 MM MISC 4 (four) times daily. 400 each 3   Insulin Pen Needle (TECHLITE PEN NEEDLES) 32G X 6 MM MISC Use 4 times a day 100 each 3   Insulin Pen Needle (UNIFINE PENTIPS) 31G X 5 MM MISC  Use 4 times daily as directed 400 each 3   iron polysaccharides (FERREX 150) 150 MG capsule Take 1 capsule (150 mg total) by mouth 2 (two) times daily. 180 capsule 0   isosorbide mononitrate (IMDUR) 60 MG 24 hr tablet Take 0.5 tablets (30 mg total) by mouth daily. 30 tablet 6   lidocaine (LIDODERM) 5 % Place 1 patch onto the skin daily.PLACE 1 PATCH ONTO THE SKIN FOR 12 (TWELVE) HOURS. REMOVE & DISCARD PATCH WITHIN 12 HOURS OR AS DIRECTED BY MD (Patient taking differently: Place 1 patch onto the skin daily as needed.) 60 patch 11   lidocaine (XYLOCAINE) 5 % ointment Apply 1 Application topically 3 (three) times daily as needed. Apply pea size amount to area as needed for pain. 50 g 0   losartan (COZAAR) 25 MG tablet Take 1 tablet (25 mg total) by mouth daily. 90 tablet 3   metolazone (ZAROXOLYN) 2.5 MG tablet Take 1 tablet (2.5 mg total) by mouth 2 (two) times a week.     montelukast (SINGULAIR) 10 MG tablet TAKE 1 TABLET BY MOUTH AT BEDTIME. (Patient taking differently: Take 10 mg by mouth at bedtime.) 90 tablet 3   Multiple Vitamin (MULTIVITAMIN WITH MINERALS) TABS tablet Take 1 tablet by mouth daily.     mycophenolate (MYFORTIC) 180 MG EC tablet Take 2 tablets (360 mg total) by mouth 2 (two) times daily. 360 tablet 3   omeprazole (PRILOSEC) 20 MG capsule TAKE 1 CAPSULE BY MOUTH DAILY. (Patient taking differently: Take 20 mg by mouth at bedtime.) 90 capsule 3   potassium chloride (KLOR-CON) 10 MEQ tablet Take 6 tablets (60 mEq total) by mouth every morning AND 4 tablets (40 mEq total) every evening. 300 tablet 6   predniSONE (DELTASONE) 5 MG tablet Take 1 tablet (5 mg total) by mouth daily. 90 tablet 3   rosuvastatin (CRESTOR) 10 MG tablet Take 1 tablet (10 mg total) by mouth daily. 90 tablet 3   spironolactone (ALDACTONE) 25 MG tablet Take 1 tablet (25 mg total) by mouth daily. 90 tablet 3   sulfamethoxazole-trimethoprim (BACTRIM) 400-80 MG tablet Take 1 tablet by mouth 3 (three) times a week every  Monday, Wednesday, and Friday. 36 tablet 3   tacrolimus (PROGRAF) 1 MG capsule Take 2 capsules (2 mg total) by mouth 2 times daily. 120 capsule 11   tamsulosin (FLOMAX) 0.4 MG CAPS capsule Take 2 capsules (0.8 mg total) by mouth daily. 60 capsule 0   Testosterone 20.25 MG/ACT (1.62%) GEL Place 2 Pump onto the skin daily. 150 g 3   torsemide (DEMADEX) 100 MG tablet Take 1 tablet (100 mg total) by mouth 2 (two) times daily. 60 tablet 5   traMADol (ULTRAM) 50 MG tablet Take by mouth.     zinc sulfate 220 (50 Zn) MG capsule Take 220 mg by mouth daily.     No current facility-administered medications on file prior to visit.  Allergies  Allergen Reactions   Contrast Media [Iodinated Contrast Media] Other (See Comments)    Kidney transplant   Iodine Other (See Comments)    Other reaction(s): Other (See Comments) Kidney transplant   Ibuprofen Other (See Comments)    Due to kidney transplant Due to kidney transplant Due to kidney transplant    Past Medical History:  Diagnosis Date   Anal condyloma    Anemia    Aortic atherosclerosis (HCC)    Aortic stenosis    a.) TTE 10/2021: Mod AS. AVA 1.06cm^2 (VTI). Mean grad ; b.) TTE 04/02/2022: no AV stenosis   Asthma, persistent controlled 02/25/2013   Attention or concentration deficit 04/26/2021   BPH with obstruction/lower urinary tract symptoms 09/25/2014   CAD (coronary artery disease) 2005   a.) LHC 1996 -> HG stenosis mLAD -> PTCA/PCI with stent x 1 (unk type) -> complicated by CFA pseudoanurysm (required surg);  b.) LHC 08/10/2002  -> 95% LAD, 70% ISR LAD, 80% pOM, 70% mRCA --> CVTS consult; c.) 4v CABG 08/13/2002; c.) 03/2018 MV: Small, fixed inferoapical and apical sep defect. No isc. EF 47% (60-65% by 05/2018 TTE); d.) 02/2021 MV: small Apical inf fixed defect. No isc -> low risk.   Cardiomegaly    Cellulitis and abscess of left leg 07/22/2020   Chronic atrial fibrillation    a.) CHA2DS2VASc = 4 (CHF, HTN, vascular disease  history, T2DM);  b.) rate/rhythm maintained without pharmacological intervention; chronically anticoagulated with apixaban   Chronic combined systolic and diastolic CHF (congestive heart failure)    a.) TTE 7/18 : EF 45-50%; b.) TTE 05/2018 : EF 60-65%, RVSP 74.8; c.) TTE 12/2018: EF 50-55%. Sev dil LA; d.) TTE 04/2019: EF 45-50%; e.) TTE 07/2021: EF 40-45%, G3DD; f.) TTE 10/2021: EF 40-45%, mild conc LVH, septal-lat dyssynchrony (LBBB), mildly red RVSF, mild-mod dil LA, mildly dil RA, mild-mod MS, mod AS; g.) TTE 04/02/2022: EF 40-45%, glob HK, LVH, red RVSF, RVE, sev LAE, mild-mod RAE, mild MR   Chronic venous stasis dermatitis of both lower extremities    Cytomegaloviral disease 2017   Diverticulosis of colon 04/22/2013   Elevated RIGHT hemidiaphragm    Erectile dysfunction    a.) on topical TRT + Trimix (alprostadil/papaverine/phentolamine) injections   ESRD (end stage renal disease) (HCC)    a.) dialysis dependent 2004-2012; b.) s/p cadaveric RIGHT renal transplant 09/13/2010   Essential hypertension 12/17/2010   GERD (gastroesophageal reflux disease)    Gout    History of 2019 novel coronavirus disease (COVID-19) 06/30/2019   History of bilateral cataract extraction 10/2017   History of renal transplant 09/13/2010   a.) s/p cadaveric donor transplant 09/13/2010   Hx of bilateral cataract extraction 10/2017   Hyperlipidemia    Hypogonadism in male 09/25/2014   Ischemic cardiomyopathy    Left cephalic vein thrombosis 06/2019   Long term current use of anticoagulant    a.) apixaban   Long term current use of immunosuppressive drug    a.) mycophenolate + prednisone + tacrolimus   Major depressive disorder 10/04/2013   Mitral stenosis 11/07/2021   a.) TTE 11/07/2021: severe MAC, mild-mod MS (MPG 6 mmHg); b.) TTE 04/02/2022: no MV stenosis   Murmur    Obesity hypoventilation syndrome 03/31/2012   OSA treated with BiPAP 09/29/2012   PAH (pulmonary artery hypertension)    a. 04/2019 RHC:  RA 19, RV 80/20, PA 78/31 (51), PCWP 25, CO/CI 7.69/3.26. PVR 3.25 --> Sev PAH, likely primarily PV HTN; b.) RHC 04/04/2022: mRA 13,  mPA 40, mPCWP 20, PA sat 59, AO sat 92, CO 7.63, CI 3.26, PVR 2.6 --> mod portal venous HTN   Pancreatitis    S/P CABG x 4 08/13/2002   a.) LIMA-LAD, LRA-OM, SVG-D1, SVG-dRCA   S/P gastric bypass    Synovitis of finger 06/11/2019   Trigger finger, unspecified little finger 12/23/2018   Type 2 diabetes mellitus with hyperglycemia, with long-term current use of insulin 09/09/2014   a.) has FreeStyle Libre CGM   Type 2 MI (myocardial infarction) (HCC)    Ulcer 05/2016   Left shin   Wears glasses     Past Surgical History:  Procedure Laterality Date   CARDIAC CATHETERIZATION N/A 08/10/2002   CATARACT EXTRACTION W/PHACO Right 10/29/2017   Procedure: CATARACT EXTRACTION PHACO AND INTRAOCULAR LENS PLACEMENT (IOC)  RIGHT DIABETIC;  Surgeon: Lockie Mola, MD;  Location: [facility];  Service: Ophthalmology;  Laterality: Right   CATARACT EXTRACTION W/PHACO Left 11/18/2017   Procedure: CATARACT EXTRACTION PHACO AND INTRAOCULAR LENS PLACEMENT (IOC) LEFT IVA/TOPICAL;  Surgeon: Lockie Mola, MD;  Location: [facility];  Service: Ophthalmology;  Laterality: Left   COLONOSCOPY WITH PROPOFOL N/A 04/22/2013   Procedure: COLONOSCOPY WITH PROPOFOL;  Surgeon: Rachael Fee, MD;  Location: [facility];  Service: Endoscopy;  Laterality: N/A;   CORONARY ANGIOPLASTY WITH STENT PLACEMENT N/A 1996   CORONARY ARTERY BYPASS GRAFT N/A 08/13/2002   Procedure: 4v CORONARY ARTERY BYPASS GRAFTING; Location: [facility]; Surgeon: Katy Apo, MD   CYSTOSCOPY WITH INSERTION OF UROLIFT N/A 12/25/2021   Procedure: CYSTOSCOPY WITH INSERTION OF UROLIFT;  Surgeon: Riki Altes, MD;  Location: [facility];  Service: Urology;  Laterality: N/A;   DG ANGIO AV SHUNT*L*     right and left upper arms   FASCIOTOMY  03/03/2012   Procedure: FASCIOTOMY;  Surgeon: Nicki Reaper, MD;  Location: [facility];  Service: Orthopedics;  Laterality: Right;  FASCIOTOMY RIGHT SMALL FINGER   FASCIOTOMY Left 08/17/2013   Procedure: FASCIOTOMY LEFT RING;  Surgeon: Nicki Reaper, MD;  Location: [facility];  Service: Orthopedics;  Laterality: Left;   INCISION AND DRAINAGE ABSCESS Left 10/15/2015   Procedure: INCISION AND DRAINAGE ABSCESS;  Surgeon: Leafy Ro, MD;  Location: [facility];  Service: General;  Laterality: Left;   KIDNEY TRANSPLANT  09/13/2010   cadaver--at [facility] HEART CATH N/A 11/15/2016   Procedure: RIGHT HEART CATH;  Surgeon: Iran Ouch, MD;  Location: [facility];  Service: Cardiovascular;  Laterality: N/A;   RIGHT HEART CATH N/A 05/03/2019   Procedure: RIGHT HEART CATH;  Surgeon: Laurey Morale, MD;  [facility]: [facility];  Service: Cardiovascular;  Laterality: N/A;   RIGHT HEART CATH N/A 04/04/2022   Procedure: RIGHT HEART CATH;  Surgeon: Laurey Morale, MD;  Location: [facility];  Service: Cardiovascular;  Laterality: N/A;   ROUX-EN-Y GASTRIC BYPASS N/A 2015   TYMPANIC MEMBRANE REPAIR Left 03/2010   VASECTOMY     WART FULGURATION N/A 05/31/2022   Procedure: FULGURATION ANAL WART;  Surgeon: Sung Amabile, DO;  Location: [facility];  Service: General;  Laterality: N/A;    Family History  Problem Relation Age of Onset   Heart disease Father    Kidney failure Father    Kidney disease Father    Diabetes Maternal Grandmother    Breast cancer Maternal Grandmother    Valvular heart disease Mother    Liver cancer Paternal Uncle  Liver cancer Paternal Grandmother    Prostate cancer Neg Hx     Social History   Socioeconomic History   Marital status: Married    Spouse name: Not on file   Number of children: 2   Years of education: 14   Highest education level: Associate degree: academic program  Occupational History   Occupation: Presenter, broadcasting:  unemployed    Comment: disabled due to kidney failure  Tobacco Use   Smoking status: Former    Types: Cigars    Quit date: 09/02/1994    Years since quitting: 28.1    Passive exposure: Never   Smokeless tobacco: Never  Vaping Use   Vaping status: Never Used  Substance and Sexual Activity   Alcohol use: Not Currently    Comment: occ   Drug use: No   Sexual activity: Not on file  Other Topics Concern   Not on file  Social History Narrative   Has living will   Wife is health care POA---son Molli Hazard is alternate   Would accept resuscitation attempts   No tube feedings if cognitively unaware   Social Determinants of Health   Financial Resource Strain: Not on file  Food Insecurity: No Food Insecurity (08/05/2022)   Hunger Vital Sign    Worried About Running Out of Food in the Last Year: Never true    Ran Out of Food in the Last Year: Never true  Transportation Needs: No Transportation Needs (08/05/2022)   PRAPARE - Administrator, Civil Service (Medical): No    Lack of Transportation (Non-Medical): No  Physical Activity: Not on file  Stress: Not on file  Social Connections: Not on file  Intimate Partner Violence: Not At Risk (08/05/2022)   Humiliation, Afraid, Rape, and Kick questionnaire    Fear of Current or Ex-Partner: No    Emotionally Abused: No    Physically Abused: No    Sexually Abused: No   Review of Systems Appetite is not good Weight is down some--but BMI still over 35 Rolls all night in bed---uses BiPap nightly. Awakens refreshed most of the time No heartburn or dysphagia Bowels go too fast after eating--once or twice after eating. No blood Voids okay now--since the prostate/urine infection Chronic pain issues No skin issues now--other than easy bruising    Objective:   Physical Exam Constitutional:      Appearance: Normal appearance.  HENT:     Mouth/Throat:     Pharynx: No oropharyngeal exudate or posterior oropharyngeal erythema.  Eyes:      Conjunctiva/sclera: Conjunctivae normal.     Pupils: Pupils are equal, round, and reactive to light.  Cardiovascular:     Rate and Rhythm: Normal rate. Rhythm irregular.     Heart sounds:     No gallop.     Comments: Gr 2/6 systolic murmur Faint pedal pulses Pulmonary:     Effort: Pulmonary effort is normal.     Breath sounds: Normal breath sounds. No wheezing or rales.  Abdominal:     Palpations: Abdomen is soft.     Tenderness: There is no abdominal tenderness.  Musculoskeletal:     Cervical back: Neck supple.     Right lower leg: No edema.     Left lower leg: No edema.  Lymphadenopathy:     Cervical: No cervical adenopathy.  Skin:    Findings: No lesion or rash.     Comments: Stasis changes in both calves Exquisite tenderness in left calf--but  not warm or red  Some tenderness on right also Scattered ecchymoses No foot lesions  Neurological:     General: No focal deficit present.     Mental Status: He is alert and oriented to person, place, and time.     Comments: Word naming---12/1 minute Recall --- 1/3 Fairly normal sensation in feet  Psychiatric:        Mood and Affect: Mood normal.        Behavior: Behavior normal.            Assessment & Plan:

## 2022-11-04 NOTE — Assessment & Plan Note (Signed)
Gets frustrated with medical limitations On bupropion 150 bid

## 2022-11-04 NOTE — Assessment & Plan Note (Signed)
Has lost some weight but BMI still 35 with multiple comorbidities

## 2022-11-04 NOTE — Telephone Encounter (Signed)
Pt called in requesting a call back would like to discuss getting a referral to vascular . Pt has CPE today with PCP

## 2022-11-04 NOTE — Assessment & Plan Note (Signed)
I have personally reviewed the Medicare Annual Wellness questionnaire and have noted 1. The patient's medical and social history 2. Their use of alcohol, tobacco or illicit drugs 3. Their current medications and supplements 4. The patient's functional ability including ADL's, fall risks, home safety risks and hearing or visual             impairment. 5. Diet and physical activities 6. Evidence for depression or mood disorders  The patients weight, height, BMI and visual acuity have been recorded in the chart I have made referrals, counseling and provided education to the patient based review of the above and I have provided the pt with a written personalized care plan for preventive services.  I have provided you with a copy of your personalized plan for preventive services. Please take the time to review along with your updated medication list.  Colon due 2027--may need GI sooner if ongoing bowel issues Just had PSA this year Tries to exercise some--goes to gym when he can Updated COVID and flu vaccines this fall Can check with transplant team about RSV

## 2022-11-04 NOTE — Assessment & Plan Note (Signed)
Seems to have had cramp--and ?muscle tear. And then bruising which has migrated down Had negative dopplers for DVT Doesn't appear to be infected Will set up with vascular

## 2022-11-04 NOTE — Transitions of Care (Post Inpatient/ED Visit) (Signed)
Phone call to pt for TOC.  Has an appointment today.  Reports that he needs a referral to vascular.  Told him to discuss during appointment today.  Pt also requests assistance getting into building.  Has swelling and pain in leg and is not ambulating well.      11/04/2022  Name: [name]. MRN: [id number] DOB: [birth year]  Today's TOC FU Call Status:    Transition Care Management Follow-up Telephone Call Date of Discharge: 11/01/22 Discharge Facility: [facility]) Type of Discharge: Emergency Department How have you been since you were released from the hospital?:  (Leg is worse, BP better) Any questions or concerns?: Yes Patient Questions/Concerns:: Vascular referral Patient Questions/Concerns Addressed: Notified Provider of Patient Questions/Concerns  Items Reviewed: Did you receive and understand the discharge instructions provided?: Yes Medications obtained,verified, and reconciled?: Yes (Medications Reviewed) Any new allergies since your discharge?: No Dietary orders reviewed?: NA  Medications Reviewed Today: Medications Reviewed Today     Reviewed by Valentino Nose, RN (Registered Nurse) on 11/04/22 at 1223  Med List Status: <None>   Medication Order Taking? Sig Documenting Provider Last Dose Status Informant  albuterol (PROVENTIL) (2.5 MG/3ML) 0.083% nebulizer solution [id number] No Take 3 mLs (2.5 mg total) by nebulization every 6 (six) hours as needed for wheezing or shortness of breath. Eden Emms, NP Taking Active Self, Pharmacy Records  albuterol (VENTOLIN HFA) 108 (90 Base) MCG/ACT inhaler [id number] No Inhale 2 puffs into the lungs every 6 (six) hours as needed for wheezing or shortness of breath. Waymon Budge, MD Taking Active Self, Pharmacy Records  AMBULATORY [facility] MEDICATION [id number] No Trimix (30/1/10)-(Pap/Phent/PGE)  Test Dose  1ml vial   Qty #3 Refills 0  [facility] [phone] Fax  [phone] Harle Battiest, PA-C Taking Active Self, Pharmacy Records  apixaban (ELIQUIS) 5 MG TABS tablet [id number]  Take 1 tablet (5 mg total) by mouth 2 (two) times daily. Laurey Morale, MD  Active   buPROPion [facility] SR) 150 MG 12 hr tablet [id number] No Take 1 tablet (150 mg total) by mouth 2 (two) times daily. [name], MD Taking Active Self, Pharmacy Records  cholecalciferol (VITAMIN D3) 25 MCG (1000 UNIT) tablet [id number] No Take 1,000 Units by mouth at bedtime. [provider] Taking Active Self, Pharmacy Records  colchicine 0.6 MG tablet [id number] No Take 1 tablet (0.6 mg total) by mouth every other day.  Taking Active Self, Pharmacy Records           Med Note [facility], MELISSA A   Sun Jul 07, 2022 10:21 AM) Pt was told by infect disease to stop the colchicine for 5 days due to starting another medication, was told that he could restart the colchicine approx on 4/14 or 07/08/22  colchicine 0.6 MG tablet [id number] No Take 1 tablet (0.6 mg total) by mouth every other day.  Taking Active   Continuous Glucose Sensor (FREESTYLE LIBRE 3 SENSOR) MISC [id number] No Use one every 2 weeks.  Taking Active Self, Pharmacy Records  cyanocobalamin 100 MCG tablet [id number] No Take 100 mcg by mouth daily. [provider] Taking Active Self, Pharmacy Records  cyclobenzaprine (FLEXERIL) 5 MG tablet [id number] No Take by mouth. [provider] Taking Active   dapagliflozin propanediol (FARXIGA) 10 MG TABS tablet [id number]  Take 1 tablet (10 mg total) by mouth daily before breakfast. Laurey Morale, MD  Active   febuxostat (ULORIC) 40 MG tablet [id number] No Take 1 tablet (  40 mg total) by mouth daily.  Taking Active Self, Pharmacy Records  febuxostat (ULORIC) 40 MG tablet [id number] No Take 3 tablets (120 mg total) by mouth daily.  Taking Active   fluticasone (FLONASE) 50 MCG/ACT nasal spray [id number] No Place 2 sprays into both nostrils daily. [name], MD  Taking Active   gabapentin (NEURONTIN) 300 MG capsule [id number] No Take 1 capsule (300 mg total) by mouth 2 (two) times daily.  Taking Active Self, Pharmacy Records  hydrALAZINE (APRESOLINE) 50 MG tablet [id number] No Take 1 tablet (50 mg total) by mouth every 8 (eight) hours. Laurey Morale, MD Taking Active   HYDROcodone-acetaminophen (NORCO/VICODIN) 5-325 MG tablet [id number] No Take 1-2 tablets by mouth every 6 (six) hours as needed for moderate pain. [name], MD Taking Active   hydrocortisone 2.5 % cream [id number] No Apply topically 3 (three) times daily as needed. [name], MD Taking Active Self, Pharmacy Records  insulin glargine-yfgn [facility]) 100 UNIT/ML Pen [id number] No Inject 40 Units into the skin daily.  Patient taking differently: Inject 35 Units into the skin daily.    Taking Active Self, Pharmacy Records  insulin lispro (HUMALOG KWIKPEN) 100 UNIT/ML KwikPen [id number] No Inject 10-20 Units into the skin 3 (three) times daily. Per sliding scale Sung Amabile, DO Taking Active Self, Pharmacy Records  Insulin Pen Needle (TECHLITE PEN NEEDLES) 31G X 5 MM MISC [id number] No 4 (four) times daily. Langston Reusing, PA-C Taking Active   Insulin Pen Needle (TECHLITE PEN NEEDLES) 32G X 6 MM MISC [id number] No Use 4 times a day  Taking Active   Insulin Pen Needle (UNIFINE PENTIPS) 31G X 5 MM MISC [id number] No Use 4 times daily as directed  Taking Active Self, Pharmacy Records  iron polysaccharides (FERREX 150) 150 MG capsule [id number] No Take 1 capsule (150 mg total) by mouth 2 (two) times daily. [name], MD Taking Active   isosorbide mononitrate (IMDUR) 60 MG 24 hr tablet [id number] No Take 0.5 tablets (30 mg total) by mouth daily. Laurey Morale, MD Taking Active   levofloxacin (LEVAQUIN) 500 MG tablet [id number]  Take 1 tablet (500 mg total) by mouth daily for 14 days. Carman Ching, PA-C  Active   lidocaine (LIDODERM) 5 % [id number] No Place  1 patch onto the skin daily.PLACE 1 PATCH ONTO THE SKIN FOR 12 (TWELVE) HOURS. REMOVE & DISCARD PATCH WITHIN 12 HOURS OR AS DIRECTED BY MD  Patient taking differently: Place 1 patch onto the skin daily as needed.   [name], MD Taking Active Self, Pharmacy Records  lidocaine (XYLOCAINE) 5 % ointment [id number] No Apply 1 Application topically 3 (three) times daily as needed. Apply pea size amount to area as needed for pain. Sung Amabile, DO Taking Active Self, Pharmacy Records  losartan (COZAAR) 25 MG tablet [id number]  Take 1 tablet (25 mg total) by mouth daily. Laurey Morale, MD  Active   LUTEIN PO [id number] No Take 1 tablet by mouth daily. [provider] Taking Active   metolazone (ZAROXOLYN) 2.5 MG tablet [id number]  Take 1 tablet (2.5 mg total) by mouth 2 (two) times a week. Laurey Morale, MD  Active   montelukast (SINGULAIR) 10 MG tablet [id number] No TAKE 1 TABLET BY MOUTH AT BEDTIME.  Patient taking differently: Take 10 mg by mouth at bedtime.   Glenford Bayley, NP Taking Active Self, Pharmacy Records  Multiple Vitamin (MULTIVITAMIN WITH MINERALS) TABS tablet [id number] No Take 1 tablet by  mouth daily. [provider] Taking Active Self, Pharmacy Records  mycophenolate (MYFORTIC) 180 MG EC tablet [id number] No Take 2 tablets (360 mg total) by mouth 2 (two) times daily.  Taking Active Self, Pharmacy Records  omeprazole (PRILOSEC) 20 MG capsule [id number] No TAKE 1 CAPSULE BY MOUTH DAILY.  Patient taking differently: Take 20 mg by mouth at bedtime.   [name], MD Taking Active Self, Pharmacy Records  potassium chloride (KLOR-CON) 10 MEQ tablet [id number] No Take 6 tablets (60 mEq total) by mouth every morning AND 4 tablets (40 mEq total) every evening. Laurey Morale, MD Taking Active   predniSONE (DELTASONE) 20 MG tablet [id number] No Take 2 tablets (40 mg total) by mouth daily for 3 days, then 1 tab daily for 3 days [name], MD Taking Active    predniSONE (DELTASONE) 5 MG tablet [id number] No Take 1 tablet (5 mg total) by mouth daily.  Taking Active Self, Pharmacy Records  rosuvastatin (CRESTOR) 10 MG tablet [id number] No Take 1 tablet (10 mg total) by mouth daily. Laurey Morale, MD Taking Active   sodium zirconium cyclosilicate (LOKELMA) 10 g PACK packet [id number] No Take 10 g by mouth daily. Laurey Morale, MD Taking Active   spironolactone (ALDACTONE) 25 MG tablet [id number] No Take 1 tablet (25 mg total) by mouth daily. Laurey Morale, MD Taking Active   sulfamethoxazole-trimethoprim (BACTRIM) 400-80 MG tablet [id number] No Take 1 tablet by mouth 3 (three) times a week every Monday, Wednesday, and Friday.  Taking Active   tacrolimus (PROGRAF) 1 MG capsule [id number] No Take 2 capsules (2 mg total) by mouth 2 times daily.  Taking Active Self, Pharmacy Records  tamsulosin (FLOMAX) 0.4 MG CAPS capsule [id number] No Take 1 capsule (0.4 mg total) by mouth daily.  Taking Active Self, Pharmacy Records  tamsulosin (FLOMAX) 0.4 MG CAPS capsule [id number] No Take 2 capsules (0.8 mg total) by mouth daily. Carman Ching, PA-C Taking Active   Testosterone 20.25 MG/ACT (1.62%) GEL [id number] No Place 2 Pump onto the skin daily. Harle Battiest, PA-C Taking Active   torsemide (DEMADEX) 100 MG tablet [id number] No Take 1 tablet (100 mg total) by mouth 2 (two) times daily. [name], MD Taking Active   traMADol Janean Sark) 50 MG tablet [id number] No Take by mouth. [provider] Taking Active   zinc sulfate 220 (50 Zn) MG capsule [id number] No Take 220 mg by mouth daily. [provider] Taking Active Self, Pharmacy Records            Home Care and Equipment/Supplies:    Functional Questionnaire: Do you need assistance with bathing/showering or dressing?: No (Needs some assistance due to leg pain) Do you need assistance with meal preparation?: Yes Do you need assistance with eating?: Yes Do you have difficulty  maintaining continence: Yes Do you need assistance with getting out of bed/getting out of a chair/moving?: Yes Do you have difficulty managing or taking your medications?: Yes  Follow up appointments reviewed: PCP Follow-up appointment confirmed?: Yes Date of PCP follow-up appointment?: 11/04/22 Follow-up Provider: Dr. Alphonsus Sias Specialist [facility] Follow-up appointment confirmed?: No Reason Specialist Follow-Up Not Confirmed:  (Pt needs referral  for vascular) Do you need transportation to your follow-up appointment?: No Do you understand care options if your condition(s) worsen?: Yes-patient verbalized understanding    SIGNATURE Valentino Nose, RN

## 2022-11-04 NOTE — Telephone Encounter (Signed)
Called pt for [facility] following ED visit on 11/01/22.  Let pt know that this could be discussed during visit today.

## 2022-11-04 NOTE — Assessment & Plan Note (Signed)
Is on apixaban 5mg  bid

## 2022-11-04 NOTE — Progress Notes (Signed)
 Hearing Screening - Comments:: Has hearing aids. Not wearing them today Vision Screening - Comments:: January 2024

## 2022-11-05 ENCOUNTER — Other Ambulatory Visit: Payer: Self-pay

## 2022-11-05 ENCOUNTER — Telehealth: Payer: Self-pay | Admitting: Internal Medicine

## 2022-11-05 MED ORDER — HYDROCODONE-ACETAMINOPHEN 5-325 MG PO TABS
1.0000 | ORAL_TABLET | Freq: Four times a day (QID) | ORAL | 0 refills | Status: DC | PRN
  Filled 2022-11-05: qty 40, 5d supply, fill #0

## 2022-11-05 NOTE — Telephone Encounter (Signed)
Let him know I sent some hydrocodone in for him

## 2022-11-05 NOTE — Telephone Encounter (Signed)
Spoke to pt

## 2022-11-05 NOTE — Telephone Encounter (Signed)
Pt called in would like PCP to call him in something for the pain in his legs pt was seen be PCP yesterday 11/04/22 . Please advise [phone]

## 2022-11-06 ENCOUNTER — Other Ambulatory Visit: Payer: Self-pay

## 2022-11-07 ENCOUNTER — Other Ambulatory Visit: Payer: Self-pay

## 2022-11-13 ENCOUNTER — Other Ambulatory Visit: Payer: Self-pay

## 2022-11-18 ENCOUNTER — Other Ambulatory Visit: Payer: Self-pay

## 2022-11-18 ENCOUNTER — Encounter: Payer: Self-pay | Admitting: Internal Medicine

## 2022-11-18 ENCOUNTER — Ambulatory Visit (INDEPENDENT_AMBULATORY_CARE_PROVIDER_SITE_OTHER): Payer: Commercial Managed Care - PPO | Admitting: Internal Medicine

## 2022-11-18 VITALS — BP 102/70 | HR 60 | Temp 97.7°F | Ht 70.0 in | Wt 256.0 lb

## 2022-11-18 DIAGNOSIS — J45998 Other asthma: Secondary | ICD-10-CM

## 2022-11-18 DIAGNOSIS — J4521 Mild intermittent asthma with (acute) exacerbation: Secondary | ICD-10-CM

## 2022-11-18 DIAGNOSIS — J45901 Unspecified asthma with (acute) exacerbation: Secondary | ICD-10-CM | POA: Insufficient documentation

## 2022-11-18 LAB — POC COVID19 BINAXNOW: SARS Coronavirus 2 Ag: NEGATIVE

## 2022-11-18 MED ORDER — AMOXICILLIN-POT CLAVULANATE 875-125 MG PO TABS
1.0000 | ORAL_TABLET | Freq: Two times a day (BID) | ORAL | 0 refills | Status: DC
  Filled 2022-11-18: qty 12, 6d supply, fill #0
  Filled 2022-11-18: qty 2, 1d supply, fill #0

## 2022-11-18 MED ORDER — ALBUTEROL SULFATE HFA 108 (90 BASE) MCG/ACT IN AERS
2.0000 | INHALATION_SPRAY | Freq: Four times a day (QID) | RESPIRATORY_TRACT | 5 refills | Status: DC | PRN
  Filled 2022-11-18: qty 6.7, 25d supply, fill #0

## 2022-11-18 MED ORDER — PREDNISONE 20 MG PO TABS
40.0000 mg | ORAL_TABLET | Freq: Every day | ORAL | 0 refills | Status: DC
  Filled 2022-11-18: qty 1, 1d supply, fill #0
  Filled 2022-11-18: qty 8, 3d supply, fill #0

## 2022-11-18 MED ORDER — ALBUTEROL SULFATE (2.5 MG/3ML) 0.083% IN NEBU
2.5000 mg | INHALATION_SOLUTION | Freq: Four times a day (QID) | RESPIRATORY_TRACT | 0 refills | Status: DC | PRN
  Filled 2022-11-18: qty 75, 6d supply, fill #0
  Filled 2022-11-18: qty 75, 7d supply, fill #0

## 2022-11-18 NOTE — Addendum Note (Signed)
Addended by: Eual Fines on: 11/18/2022 04:18 PM   Modules accepted: Orders

## 2022-11-18 NOTE — Assessment & Plan Note (Addendum)
Will try steroid burst---prednisone 40mg  daily x 3, then 20mg  x 3 days---then back to 5mg  daily Refill albuterol  COVID negative Will give augmentin

## 2022-11-18 NOTE — Progress Notes (Signed)
Subjective:    Patient ID: [name]., male    DOB: 1960/11/08, 62 y.o.   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 Feels it triggered a reaction---lots of nasal drainage going into his throat and chest Took benedryl--helped but knocked him out  Now with ongoing cough---a little yellow mucus Seems stuck in his bronchial tubes Wheezing--especially at night (albuterol was outdated) Some chills yesterday Some SOB  Hasn't tested for COVID No other medicines for this  Current Outpatient Medications on File Prior to Visit  Medication Sig Dispense Refill   albuterol (PROVENTIL) (2.5 MG/3ML) 0.083% nebulizer solution Take 3 mLs (2.5 mg total) by nebulization every 6 (six) hours as needed for wheezing or shortness of breath. 150 mL 0   albuterol (VENTOLIN HFA) 108 (90 Base) MCG/ACT inhaler Inhale 2 puffs into the lungs every 6 (six) hours as needed for wheezing or shortness of breath. 6.7 g 5   apixaban (ELIQUIS) 5 MG TABS tablet Take 1 tablet (5 mg total) by mouth 2 (two) times daily. 180 tablet 3   buPROPion (WELLBUTRIN SR) 150 MG 12 hr tablet Take 1 tablet (150 mg total) by mouth 2 (two) times daily. 180 tablet 3   cholecalciferol (VITAMIN D3) 25 MCG (1000 UNIT) tablet Take 1,000 Units by mouth at bedtime.     colchicine 0.6 MG tablet Take 1 tablet (0.6 mg total) by mouth every other day. 45 tablet 1   Continuous Glucose Sensor (FREESTYLE LIBRE 3 SENSOR) MISC Use one every 2 weeks. 6 each 3   cyclobenzaprine (FLEXERIL) 5 MG tablet Take by mouth.     dapagliflozin propanediol (FARXIGA) 10 MG TABS tablet Take 1 tablet (10 mg total) by mouth daily before breakfast. 90 tablet 3   febuxostat (ULORIC) 40 MG tablet Take 3 tablets (120 mg total) by mouth daily. 90 tablet 5   fluticasone (FLONASE) 50 MCG/ACT nasal spray Place 2 sprays into both nostrils daily. 16 g 11   gabapentin (NEURONTIN) 300 MG capsule Take 1 capsule (300 mg total)  by mouth 2 (two) times daily. 180 capsule 3   hydrALAZINE (APRESOLINE) 50 MG tablet Take 1 tablet (50 mg total) by mouth every 8 (eight) hours. 90 tablet 6   HYDROcodone-acetaminophen (NORCO/VICODIN) 5-325 MG tablet Take 1-2 tablets by mouth every 6 (six) hours as needed for moderate pain. 40 tablet 0   hydrocortisone 2.5 % cream Apply topically 3 (three) times daily as needed. 30 g 3   insulin glargine-yfgn (SEMGLEE) 100 UNIT/ML Pen Inject 40 Units into the skin daily. 45 mL 3   insulin lispro (HUMALOG KWIKPEN) 100 UNIT/ML KwikPen Inject 10-20 Units into the skin 3 (three) times daily. Per sliding scale     Insulin Pen Needle (TECHLITE PEN NEEDLES) 31G X 5 MM MISC 4 (four) times daily. 400 each 3   Insulin Pen Needle (TECHLITE PEN NEEDLES) 32G X 6 MM MISC Use 4 times a day 100 each 3   Insulin Pen Needle (UNIFINE PENTIPS) 31G X 5 MM MISC Use 4 times daily as directed 400 each 3   iron polysaccharides (FERREX 150) 150 MG capsule Take 1 capsule (150 mg total) by mouth 2 (two) times daily. 180 capsule 0   isosorbide mononitrate (IMDUR) 60 MG 24 hr tablet Take 0.5 tablets (30 mg total) by mouth daily. 30 tablet 6   lidocaine (LIDODERM) 5 % Place 1 patch onto the skin daily.PLACE 1 PATCH ONTO THE SKIN FOR 12 (  TWELVE) HOURS. REMOVE & DISCARD PATCH WITHIN 12 HOURS OR AS DIRECTED BY MD (Patient taking differently: Place 1 patch onto the skin daily as needed.) 60 patch 11   lidocaine (XYLOCAINE) 5 % ointment Apply 1 Application topically 3 (three) times daily as needed. Apply pea size amount to area as needed for pain. 50 g 0   losartan (COZAAR) 25 MG tablet Take 1 tablet (25 mg total) by mouth daily. 90 tablet 3   metolazone (ZAROXOLYN) 2.5 MG tablet Take 1 tablet (2.5 mg total) by mouth 2 (two) times a week.     montelukast (SINGULAIR) 10 MG tablet TAKE 1 TABLET BY MOUTH AT BEDTIME. (Patient taking differently: Take 10 mg by mouth at bedtime.) 90 tablet 3   Multiple Vitamin (MULTIVITAMIN WITH MINERALS)  TABS tablet Take 1 tablet by mouth daily.     mycophenolate (MYFORTIC) 180 MG EC tablet Take 2 tablets (360 mg total) by mouth 2 (two) times daily. 360 tablet 3   omeprazole (PRILOSEC) 20 MG capsule TAKE 1 CAPSULE BY MOUTH DAILY. (Patient taking differently: Take 20 mg by mouth at bedtime.) 90 capsule 3   potassium chloride (KLOR-CON) 10 MEQ tablet Take 6 tablets (60 mEq total) by mouth every morning AND 4 tablets (40 mEq total) every evening. 300 tablet 6   predniSONE (DELTASONE) 5 MG tablet Take 1 tablet (5 mg total) by mouth daily. 90 tablet 3   rosuvastatin (CRESTOR) 10 MG tablet Take 1 tablet (10 mg total) by mouth daily. 90 tablet 3   spironolactone (ALDACTONE) 25 MG tablet Take 1 tablet (25 mg total) by mouth daily. 90 tablet 3   sulfamethoxazole-trimethoprim (BACTRIM) 400-80 MG tablet Take 1 tablet by mouth 3 (three) times a week every Monday, Wednesday, and Friday. 36 tablet 3   tacrolimus (PROGRAF) 1 MG capsule Take 2 capsules (2 mg total) by mouth 2 times daily. 120 capsule 11   tamsulosin (FLOMAX) 0.4 MG CAPS capsule Take 2 capsules (0.8 mg total) by mouth daily. 60 capsule 0   Testosterone 20.25 MG/ACT (1.62%) GEL Place 2 Pump onto the skin daily. 150 g 3   torsemide (DEMADEX) 100 MG tablet Take 1 tablet (100 mg total) by mouth 2 (two) times daily. 60 tablet 5   zinc sulfate 220 (50 Zn) MG capsule Take 220 mg by mouth daily.     No current facility-administered medications on file prior to visit.    Allergies  Allergen Reactions   Contrast Media [Iodinated Contrast Media] Other (See Comments)    Kidney transplant   Iodine Other (See Comments)    Other reaction(s): Other (See Comments) Kidney transplant   Ibuprofen Other (See Comments)    Due to kidney transplant Due to kidney transplant Due to kidney transplant    Past Medical History:  Diagnosis Date   Anal condyloma    Anemia    Aortic atherosclerosis (HCC)    Aortic stenosis    a.) TTE 10/2021: Mod AS. AVA 1.06cm^2  (VTI). Mean grad ; b.) TTE 04/02/2022: no AV stenosis   Asthma, persistent controlled 02/25/2013   Attention or concentration deficit 04/26/2021   BPH with obstruction/lower urinary tract symptoms 09/25/2014   CAD (coronary artery disease) 2005   a.) LHC 1996 -> HG stenosis mLAD -> PTCA/PCI with stent x 1 (unk type) -> complicated by CFA pseudoanurysm (required surg);  b.) LHC 08/10/2002  -> 95% LAD, 70% ISR LAD, 80% pOM, 70% mRCA --> CVTS consult; c.) 4v CABG 08/13/2002; c.) 03/2018 MV: Small,  fixed inferoapical and apical sep defect. No isc. EF 47% (60-65% by 05/2018 TTE); d.) 02/2021 MV: small Apical inf fixed defect. No isc -> low risk.   Cardiomegaly    Cellulitis and abscess of left leg 07/22/2020   Chronic atrial fibrillation    a.) CHA2DS2VASc = 4 (CHF, HTN, vascular disease history, T2DM);  b.) rate/rhythm maintained without pharmacological intervention; chronically anticoagulated with apixaban   Chronic combined systolic and diastolic CHF (congestive heart failure)    a.) TTE 7/18 : EF 45-50%; b.) TTE 05/2018 : EF 60-65%, RVSP 74.8; c.) TTE 12/2018: EF 50-55%. Sev dil LA; d.) TTE 04/2019: EF 45-50%; e.) TTE 07/2021: EF 40-45%, G3DD; f.) TTE 10/2021: EF 40-45%, mild conc LVH, septal-lat dyssynchrony (LBBB), mildly red RVSF, mild-mod dil LA, mildly dil RA, mild-mod MS, mod AS; g.) TTE 04/02/2022: EF 40-45%, glob HK, LVH, red RVSF, RVE, sev LAE, mild-mod RAE, mild MR   Chronic venous stasis dermatitis of both lower extremities    Cytomegaloviral disease 2017   Diverticulosis of colon 04/22/2013   Elevated RIGHT hemidiaphragm    Erectile dysfunction    a.) on topical TRT + Trimix (alprostadil/papaverine/phentolamine) injections   ESRD (end stage renal disease) (HCC)    a.) dialysis dependent 2004-2012; b.) s/p cadaveric RIGHT renal transplant 09/13/2010   Essential hypertension 12/17/2010   GERD (gastroesophageal reflux disease)    Gout    History of 2019 novel coronavirus disease  (COVID-19) 06/30/2019   History of bilateral cataract extraction 10/2017   History of renal transplant 09/13/2010   a.) s/p cadaveric donor transplant 09/13/2010   Hx of bilateral cataract extraction 10/2017   Hyperlipidemia    Hypogonadism in male 09/25/2014   Ischemic cardiomyopathy    Left cephalic vein thrombosis 06/2019   Long term current use of anticoagulant    a.) apixaban   Long term current use of immunosuppressive drug    a.) mycophenolate + prednisone + tacrolimus   Major depressive disorder 10/04/2013   Mitral stenosis 11/07/2021   a.) TTE 11/07/2021: severe MAC, mild-mod MS (MPG 6 mmHg); b.) TTE 04/02/2022: no MV stenosis   Murmur    Obesity hypoventilation syndrome 03/31/2012   OSA treated with BiPAP 09/29/2012   PAH (pulmonary artery hypertension)    a. 04/2019 RHC: RA 19, RV 80/20, PA 78/31 (51), PCWP 25, CO/CI 7.69/3.26. PVR 3.25 --> Sev PAH, likely primarily PV HTN; b.) RHC 04/04/2022: mRA 13, mPA 40, mPCWP 20, PA sat 59, AO sat 92, CO 7.63, CI 3.26, PVR 2.6 --> mod portal venous HTN   Pancreatitis    S/P CABG x 4 08/13/2002   a.) LIMA-LAD, LRA-OM, SVG-D1, SVG-dRCA   S/P gastric bypass    Synovitis of finger 06/11/2019   Trigger finger, unspecified little finger 12/23/2018   Type 2 diabetes mellitus with hyperglycemia, with long-term current use of insulin 09/09/2014   a.) has FreeStyle Libre CGM   Type 2 MI (myocardial infarction) (HCC)    Ulcer 05/2016   Left shin   Wears glasses     Past Surgical History:  Procedure Laterality Date   CARDIAC CATHETERIZATION N/A 08/10/2002   CATARACT EXTRACTION W/PHACO Right 10/29/2017   Procedure: CATARACT EXTRACTION PHACO AND INTRAOCULAR LENS PLACEMENT (IOC)  RIGHT DIABETIC;  Surgeon: Lockie Mola, MD;  Location: [facility];  Service: Ophthalmology;  Laterality: Right   CATARACT EXTRACTION W/PHACO Left 11/18/2017   Procedure: CATARACT EXTRACTION PHACO AND INTRAOCULAR LENS PLACEMENT (IOC) LEFT  IVA/TOPICAL;  Surgeon: Lockie Mola, MD;  Location:  [facility];  Service: Ophthalmology;  Laterality: Left   COLONOSCOPY WITH PROPOFOL N/A 04/22/2013   Procedure: COLONOSCOPY WITH PROPOFOL;  Surgeon: Rachael Fee, MD;  Location: [facility];  Service: Endoscopy;  Laterality: N/A;   CORONARY ANGIOPLASTY WITH STENT PLACEMENT N/A 1996   CORONARY ARTERY BYPASS GRAFT N/A 08/13/2002   Procedure: 4v CORONARY ARTERY BYPASS GRAFTING; Location: [facility]; Surgeon: Katy Apo, MD   CYSTOSCOPY WITH INSERTION OF UROLIFT N/A 12/25/2021   Procedure: CYSTOSCOPY WITH INSERTION OF UROLIFT;  Surgeon: Riki Altes, MD;  Location: [facility];  Service: Urology;  Laterality: N/A;   DG ANGIO AV SHUNT*L*     right and left upper arms   FASCIOTOMY  03/03/2012   Procedure: FASCIOTOMY;  Surgeon: Nicki Reaper, MD;  Location: [facility];  Service: Orthopedics;  Laterality: Right;  FASCIOTOMY RIGHT SMALL FINGER   FASCIOTOMY Left 08/17/2013   Procedure: FASCIOTOMY LEFT RING;  Surgeon: Nicki Reaper, MD;  Location: [facility];  Service: Orthopedics;  Laterality: Left;   INCISION AND DRAINAGE ABSCESS Left 10/15/2015   Procedure: INCISION AND DRAINAGE ABSCESS;  Surgeon: Leafy Ro, MD;  Location: [facility];  Service: General;  Laterality: Left;   KIDNEY TRANSPLANT  09/13/2010   cadaver--at [facility] HEART CATH N/A 11/15/2016   Procedure: RIGHT HEART CATH;  Surgeon: Iran Ouch, MD;  Location: [facility];  Service: Cardiovascular;  Laterality: N/A;   RIGHT HEART CATH N/A 05/03/2019   Procedure: RIGHT HEART CATH;  Surgeon: Laurey Morale, MD;  Location: [facility];  Service: Cardiovascular;  Laterality: N/A;   RIGHT HEART CATH N/A 04/04/2022   Procedure: RIGHT HEART CATH;  Surgeon: Laurey Morale, MD;  Location: [facility]AS[facility] CV LAB;  Service: Cardiovascular;  Laterality: N/A;   ROUX-EN-Y GASTRIC BYPASS N/A 2015   TYMPANIC MEMBRANE REPAIR  Left 03/2010   VASECTOMY     WART FULGURATION N/A 05/31/2022   Procedure: FULGURATION ANAL WART;  Surgeon: Sung Amabile, DO;  Location: [facility];  Service: General;  Laterality: N/A;    Family History  Problem Relation Age of Onset   Heart disease Father    Kidney failure Father    Kidney disease Father    Diabetes Maternal Grandmother    Breast cancer Maternal Grandmother    Valvular heart disease Mother    Liver cancer Paternal Uncle    Liver cancer Paternal Grandmother    Prostate cancer Neg Hx     Social History   Socioeconomic History   Marital status: Married    Spouse name: Not on file   Number of children: 2   Years of education: 14   Highest education level: Associate degree: academic program  Occupational History   Occupation: Presenter, broadcasting: unemployed    Comment: disabled due to kidney failure  Tobacco Use   Smoking status: Former    Types: Cigars    Quit date: 09/02/1994    Years since quitting: 28.2    Passive exposure: Never   Smokeless tobacco: Never  Vaping Use   Vaping status: Never Used  Substance and Sexual Activity   Alcohol use: Not Currently    Comment: occ   Drug use: No   Sexual activity: Not on file  Other Topics Concern   Not on file  Social History Narrative   Has living will   Wife is health care POA---son Molli Hazard is alternate   Would  accept resuscitation attempts   No tube feedings if cognitively unaware   Social Determinants of Health   Financial Resource Strain: Not on file  Food Insecurity: No Food Insecurity (08/05/2022)   Hunger Vital Sign    Worried About Running Out of Food in the Last Year: Never true    Ran Out of Food in the Last Year: Never true  Transportation Needs: No Transportation Needs (08/05/2022)   PRAPARE - Administrator, Civil Service (Medical): No    Lack of Transportation (Non-Medical): No  Physical Activity: Not on file  Stress: Not on file  Social Connections: Not  on file  Intimate Partner Violence: Not At Risk (08/05/2022)   Humiliation, Afraid, Rape, and Kick questionnaire    Fear of Current or Ex-Partner: No    Emotionally Abused: No    Physically Abused: No    Sexually Abused: No   Review of Systems No loss of smell or taste No N/V Able to eat     Objective:   Physical Exam Constitutional:      Appearance: Normal appearance.  HENT:     Head:     Comments: No sinus tenderness    Right Ear: Tympanic membrane and ear canal normal.     Left Ear: Tympanic membrane and ear canal normal.     Mouth/Throat:     Pharynx: No oropharyngeal exudate or posterior oropharyngeal erythema.  Cardiovascular:     Rate and Rhythm: Normal rate. Rhythm irregular.     Heart sounds: No murmur heard.    No gallop.     Comments: Faint heart sounds Pulmonary:     Comments: Mildly decreased breath sounds throughout Mildly prolonged expiratory phase and wheezing Musculoskeletal:     Cervical back: Neck supple.  Lymphadenopathy:     Cervical: No cervical adenopathy.  Neurological:     Mental Status: He is alert.            Assessment & Plan:

## 2022-11-20 ENCOUNTER — Other Ambulatory Visit: Payer: Self-pay

## 2022-11-20 DIAGNOSIS — D508 Other iron deficiency anemias: Secondary | ICD-10-CM

## 2022-11-21 ENCOUNTER — Inpatient Hospital Stay: Payer: Commercial Managed Care - PPO

## 2022-11-21 DIAGNOSIS — D508 Other iron deficiency anemias: Secondary | ICD-10-CM

## 2022-11-21 DIAGNOSIS — D649 Anemia, unspecified: Secondary | ICD-10-CM | POA: Diagnosis not present

## 2022-11-21 LAB — CBC (CANCER CENTER ONLY)
HCT: 40.1 % (ref 39.0–52.0)
Hemoglobin: 12.8 g/dL — ABNORMAL LOW (ref 13.0–17.0)
MCH: 28.6 pg (ref 26.0–34.0)
MCHC: 31.9 g/dL (ref 30.0–36.0)
MCV: 89.7 fL (ref 80.0–100.0)
Platelet Count: 127 10*3/uL — ABNORMAL LOW (ref 150–400)
RBC: 4.47 MIL/uL (ref 4.22–5.81)
RDW: 15.5 % (ref 11.5–15.5)
WBC Count: 5.9 10*3/uL (ref 4.0–10.5)
nRBC: 0 % (ref 0.0–0.2)

## 2022-11-21 LAB — IRON AND TIBC
Iron: 50 ug/dL (ref 45–182)
Saturation Ratios: 18 % (ref 17.9–39.5)
TIBC: 280 ug/dL (ref 250–450)
UIBC: 230 ug/dL

## 2022-11-21 LAB — FERRITIN: Ferritin: 142 ng/mL (ref 24–336)

## 2022-11-22 ENCOUNTER — Other Ambulatory Visit: Payer: Self-pay | Admitting: Physician Assistant

## 2022-11-22 ENCOUNTER — Other Ambulatory Visit: Payer: Self-pay

## 2022-11-22 DIAGNOSIS — N411 Chronic prostatitis: Secondary | ICD-10-CM

## 2022-11-22 MED ORDER — TAMSULOSIN HCL 0.4 MG PO CAPS
0.8000 mg | ORAL_CAPSULE | Freq: Every day | ORAL | 0 refills | Status: DC
  Filled 2022-11-22: qty 60, 30d supply, fill #0

## 2022-11-26 ENCOUNTER — Other Ambulatory Visit: Payer: Self-pay

## 2022-11-26 ENCOUNTER — Ambulatory Visit: Payer: Self-pay | Admitting: Surgery

## 2022-11-26 ENCOUNTER — Telehealth: Payer: Self-pay

## 2022-11-26 DIAGNOSIS — I4811 Longstanding persistent atrial fibrillation: Secondary | ICD-10-CM | POA: Insufficient documentation

## 2022-11-26 DIAGNOSIS — Z86010 Personal history of colonic polyps: Secondary | ICD-10-CM | POA: Diagnosis not present

## 2022-11-26 DIAGNOSIS — Z860101 Personal history of adenomatous and serrated colon polyps: Secondary | ICD-10-CM | POA: Insufficient documentation

## 2022-11-26 DIAGNOSIS — M6208 Separation of muscle (nontraumatic), other site: Secondary | ICD-10-CM | POA: Diagnosis not present

## 2022-11-26 DIAGNOSIS — A630 Anogenital (venereal) warts: Secondary | ICD-10-CM | POA: Diagnosis not present

## 2022-11-26 DIAGNOSIS — D849 Immunodeficiency, unspecified: Secondary | ICD-10-CM | POA: Diagnosis not present

## 2022-11-26 DIAGNOSIS — Z7901 Long term (current) use of anticoagulants: Secondary | ICD-10-CM | POA: Diagnosis not present

## 2022-11-26 NOTE — Telephone Encounter (Signed)
   Pre-operative Risk Assessment    Patient Name: [name].  DOB: 1960-12-13 MRN: [id number]   Last OV 10/21/22 with Dr. Shirlee Latch Next OV None   Request for Surgical Clearance    Procedure:   Laser Removal/ Ablation of Condylomata Anorectal Exam  Date of Surgery:  Clearance TBD                                 Surgeon:  Karie Soda, MD Surgeon's Group or Practice Name:  [facility] Phone number:  [phone] Fax number:  [phone] ATTN: Ethlyn Gallery, CMA   Type of Clearance Requested:   {- Medical  - Pharmacy:  Hold Apixaban (Eliquis) pt will need instructions on when/if to hold  5. What type of anesthesia will be used?  Press F2 and select the anesthesia to be used for the procedure.  :1}  Type of Anesthesia:  General    Additional requests/questions:    Signed, Zada Finders   11/26/2022, 12:08 PM

## 2022-11-26 NOTE — Telephone Encounter (Signed)
Patient with diagnosis of afib on Eliquis for anticoagulation.    Procedure: Laser Removal/ Ablation of Condylomata Anorectal Exam  Date of procedure: TBD  CHA2DS2-VASc Score = 4  This indicates a 4.8% annual risk of stroke. The patient's score is based upon: CHF History: 1 HTN History: 1 Diabetes History: 1 Stroke History: 0 Vascular Disease History: 1 Age Score: 0 Gender Score: 0   CrCl 42mL/min Platelet count 127K  Per office protocol, patient can hold Eliquis for 1-2 days prior to procedure.    **This guidance is not considered finalized until pre-operative APP has relayed final recommendations.**

## 2022-11-26 NOTE — Telephone Encounter (Signed)
 1st attempt at scheduling tele preop. [email or web address]

## 2022-11-26 NOTE — Telephone Encounter (Signed)
   Name: Nicola Fehlman.  DOB: 17-May-1960  MRN: [id number]  Primary Cardiologist: Lorine Bears, MD  Chart reviewed as part of pre-operative protocol coverage. Because of Ebubechukwu Helder Bascom Jr.'s past medical history and time since last visit, he will require a follow-up in-office visit in order to better assess preoperative cardiovascular risk.Pt follows in the advanced heart failure clinic.   Pre-op covering staff: - Please schedule appointment and call patient to inform them. If patient already had an upcoming appointment within acceptable timeframe, please add "pre-op clearance" to the appointment notes so provider is aware. - Please contact requesting surgeon's office via preferred method (i.e, phone, fax) to inform them of need for appointment prior to surgery.  This message will also be routed to pharmacy pool for input on holding Eliquis as requested below so that this information is available to the clearing provider at time of patient's appointment.   Joylene Grapes, NP  11/26/2022, 12:23 PM

## 2022-11-27 ENCOUNTER — Telehealth: Payer: Self-pay | Admitting: Family

## 2022-11-27 NOTE — Telephone Encounter (Signed)
[name] from paramedicine program called to report an elevation in patient's BP. On 10/17/22 his BP was 121/49 and yesterday it was 152/74. He has been using his nebulizer more due to allergen exposure and just finished a 6 day prednisone burst which may be causing his elevation in BP. Continue to monitor and let us know if it doesn't decline back to his normal.

## 2022-11-27 NOTE — Telephone Encounter (Signed)
Pt has been scheduled to see Dr. Kirke Corin 12/03/22 @ 2:20 for pre op clearance ok per Martie Lee K. In the [place] office. I will update all parties involved. Pt thanked me for the help.

## 2022-12-03 ENCOUNTER — Encounter: Payer: Self-pay | Admitting: Cardiovascular Disease

## 2022-12-03 ENCOUNTER — Other Ambulatory Visit: Payer: Self-pay

## 2022-12-03 ENCOUNTER — Ambulatory Visit: Payer: Commercial Managed Care - PPO | Attending: Cardiovascular Disease | Admitting: Cardiovascular Disease

## 2022-12-03 ENCOUNTER — Telehealth (HOSPITAL_COMMUNITY): Payer: Self-pay

## 2022-12-03 VITALS — BP 110/64 | HR 62 | Ht 70.0 in | Wt 263.2 lb

## 2022-12-03 DIAGNOSIS — I2510 Atherosclerotic heart disease of native coronary artery without angina pectoris: Secondary | ICD-10-CM

## 2022-12-03 DIAGNOSIS — I10 Essential (primary) hypertension: Secondary | ICD-10-CM | POA: Diagnosis not present

## 2022-12-03 DIAGNOSIS — E785 Hyperlipidemia, unspecified: Secondary | ICD-10-CM

## 2022-12-03 DIAGNOSIS — I5042 Chronic combined systolic (congestive) and diastolic (congestive) heart failure: Secondary | ICD-10-CM | POA: Diagnosis not present

## 2022-12-03 DIAGNOSIS — I482 Chronic atrial fibrillation, unspecified: Secondary | ICD-10-CM

## 2022-12-03 MED ORDER — HYDRALAZINE HCL 25 MG PO TABS
25.0000 mg | ORAL_TABLET | Freq: Three times a day (TID) | ORAL | 3 refills | Status: DC
  Filled 2022-12-03: qty 270, 90d supply, fill #0

## 2022-12-03 NOTE — Patient Instructions (Signed)
Medication Instructions:  DECREASE the Hydralazine to 25 mg once every eight hours  *If you need a refill on your cardiac medications before your next appointment, please call your pharmacy*   Lab Work: None ordered If you have labs (blood work) drawn today and your tests are completely normal, you will receive your results only by: MyChart Message (if you have MyChart) OR A paper copy in the mail If you have any lab test that is abnormal or we need to change your treatment, we will call you to review the results.   Testing/Procedures: None ordered   Follow-Up: At [facility], you and your health needs are our priority.  As part of our continuing mission to provide you with exceptional heart care, we have created designated Provider Care Teams.  These Care Teams include your primary Cardiologist (physician) and Advanced Practice Providers (APPs -  Physician Assistants and Nurse Practitioners) who all work together to provide you with the care you need, when you need it.  We recommend signing up for the patient portal called "MyChart".  Sign up information is provided on this After Visit Summary.  MyChart is used to connect with patients for Virtual Visits (Telemedicine).  Patients are able to view lab/test results, encounter notes, upcoming appointments, etc.  Non-urgent messages can be sent to your provider as well.   To learn more about what you can do with MyChart, go to [email or web address].    Your next appointment:   12 month(s)  Provider:   You may see Lorine Bears, MD or one of the following Advanced Practice Providers on your designated Care Team:   Nicolasa Ducking, NP Eula Listen, PA-C Cadence Fransico Michael, PA-C Charlsie Quest, NP    Other Instructions You are at a moderate risk for surgery -please hold Eliquis 3 days prior.

## 2022-12-03 NOTE — Telephone Encounter (Signed)
Received surgical clearance request for patient for Procedure: Laser Removal/ Ablation of Condylomata Anorectal Exam  Date of procedure: TBD  Per Dr. Shirlee Latch patient is cleared from a cardiac standpoint to hold eliquis 2 days prior to procedure and day of procedure.    Attempted to call patient, left message for patient to call back to office.   This has been faxed over to requesting physician's office via epic fax function

## 2022-12-03 NOTE — Progress Notes (Unsigned)
Cardiology Office Note   Date:  12/03/2022   ID:  [name]., DOB February 07, 1961, MRN [id number]  PCP:  [name], MD  Cardiologist:   Lorine Bears, MD   Chief Complaint  Patient presents with   Follow-up    Cardiac clearance no complaints today. Meds reviewed verbally with pt.      History of Present Illness: [name]. is a 62 y.o. male   who is here today regarding complex cardiac issues.  He has history of CAD status post CABG in 2005, chronic combined systolic and diastolic CHF secondary to ICM with prior EF of 35 to 40% now improved to 50 to 55% by echo in 03/2018, chronic A. fib on Eliquis, pulmonary hypertension, ESRD previously on hemodialysis from 2004 through 2012 status post kidney transplant in 2012, anemia of chronic disease, DM2, HTN, HLD, beta-blocker induced bradycardia, obesity status post gastric bypass surgery in 11/2013, and sleep apnea on BiPAP.   Stress test on 04/10/2018 showed no significant ischemia, small region of fixed perfusion defect in the inferoapical and apical septal region possibly attenuation artifact, EF 47%, low risk study.    He had episodes of intermittent episodes of extreme dizziness with heart rate in the high 40s.  No syncope.  He had a ZIO Patch monitor done which showed no significant bradycardic events except during sleep time when his heart rate was in 30s.  Overall, there was no indication for a pacemaker.  He is followed by the advanced heart failure clinic.  He has known history of severe pulmonary hypertension and some right-sided failure.  He had multiple hospitalizations for volume overload but has been relatively stable.  He is on torsemide 100 mg twice daily and uses metolazone twice a week.  He has advanced chronic kidney disease with most recent creatinine of 2.5.  Most recent echocardiogram in January of this year showed an EF of 40 to 45%, moderately reduced RV systolic function, moderate  pulmonary hypertension at 48.6 mmHg, mild mitral regurgitation and calcified aortic valve without significant stenosis.  He denies chest pain or worsening dyspnea.  He does complain of intermittent dizziness and hypotension. He needs to have surgical ablation of anal condyloma.  Past Medical History:  Diagnosis Date   Anal condyloma    Anemia    Aortic atherosclerosis (HCC)    Aortic stenosis    a.) TTE 10/2021: Mod AS. AVA 1.06cm^2 (VTI). Mean grad ; b.) TTE 04/02/2022: no AV stenosis   Asthma, persistent controlled 02/25/2013   Attention or concentration deficit 04/26/2021   BPH with obstruction/lower urinary tract symptoms 09/25/2014   CAD (coronary artery disease) 2005   a.) LHC 1996 -> HG stenosis mLAD -> PTCA/PCI with stent x 1 (unk type) -> complicated by CFA pseudoanurysm (required surg);  b.) LHC 08/10/2002  -> 95% LAD, 70% ISR LAD, 80% pOM, 70% mRCA --> CVTS consult; c.) 4v CABG 08/13/2002; c.) 03/2018 MV: Small, fixed inferoapical and apical sep defect. No isc. EF 47% (60-65% by 05/2018 TTE); d.) 02/2021 MV: small Apical inf fixed defect. No isc -> low risk.   Cardiomegaly    Cellulitis and abscess of left leg 07/22/2020   Chronic atrial fibrillation    a.) CHA2DS2VASc = 4 (CHF, HTN, vascular disease history, T2DM);  b.) rate/rhythm maintained without pharmacological intervention; chronically anticoagulated with apixaban   Chronic combined systolic and diastolic CHF (congestive heart failure)    a.) TTE 7/18 : EF 45-50%; b.) TTE  05/2018 : EF 60-65%, RVSP 74.8; c.) TTE 12/2018: EF 50-55%. Sev dil LA; d.) TTE 04/2019: EF 45-50%; e.) TTE 07/2021: EF 40-45%, G3DD; f.) TTE 10/2021: EF 40-45%, mild conc LVH, septal-lat dyssynchrony (LBBB), mildly red RVSF, mild-mod dil LA, mildly dil RA, mild-mod MS, mod AS; g.) TTE 04/02/2022: EF 40-45%, glob HK, LVH, red RVSF, RVE, sev LAE, mild-mod RAE, mild MR   Chronic venous stasis dermatitis of both lower extremities    Cytomegaloviral disease 2017    Diverticulosis of colon 04/22/2013   Elevated RIGHT hemidiaphragm    Erectile dysfunction    a.) on topical TRT + Trimix (alprostadil/papaverine/phentolamine) injections   ESRD (end stage renal disease) (HCC)    a.) dialysis dependent 2004-2012; b.) s/p cadaveric RIGHT renal transplant 09/13/2010   Essential hypertension 12/17/2010   GERD (gastroesophageal reflux disease)    Gout    History of 2019 novel coronavirus disease (COVID-19) 06/30/2019   History of bilateral cataract extraction 10/2017   History of renal transplant 09/13/2010   a.) s/p cadaveric donor transplant 09/13/2010   Hx of bilateral cataract extraction 10/2017   Hyperlipidemia    Hypogonadism in male 09/25/2014   Ischemic cardiomyopathy    Left cephalic vein thrombosis 06/2019   Long term current use of anticoagulant    a.) apixaban   Long term current use of immunosuppressive drug    a.) mycophenolate + prednisone + tacrolimus   Major depressive disorder 10/04/2013   Mitral stenosis 11/07/2021   a.) TTE 11/07/2021: severe MAC, mild-mod MS (MPG 6 mmHg); b.) TTE 04/02/2022: no MV stenosis   Murmur    Obesity hypoventilation syndrome 03/31/2012   OSA treated with BiPAP 09/29/2012   PAH (pulmonary artery hypertension)    a. 04/2019 RHC: RA 19, RV 80/20, PA 78/31 (51), PCWP 25, CO/CI 7.69/3.26. PVR 3.25 --> Sev PAH, likely primarily PV HTN; b.) RHC 04/04/2022: mRA 13, mPA 40, mPCWP 20, PA sat 59, AO sat 92, CO 7.63, CI 3.26, PVR 2.6 --> mod portal venous HTN   Pancreatitis    S/P CABG x 4 08/13/2002   a.) LIMA-LAD, LRA-OM, SVG-D1, SVG-dRCA   S/P gastric bypass    Synovitis of finger 06/11/2019   Trigger finger, unspecified little finger 12/23/2018   Type 2 diabetes mellitus with hyperglycemia, with long-term current use of insulin 09/09/2014   a.) has FreeStyle Libre CGM   Type 2 MI (myocardial infarction) (HCC)    Ulcer 05/2016   Left shin   Wears glasses     Past Surgical History:  Procedure Laterality  Date   CARDIAC CATHETERIZATION N/A 08/10/2002   CATARACT EXTRACTION W/PHACO Right 10/29/2017   Procedure: CATARACT EXTRACTION PHACO AND INTRAOCULAR LENS PLACEMENT (IOC)  RIGHT DIABETIC;  Surgeon: Lockie Mola, MD;  Location: [facility];  Service: Ophthalmology;  Laterality: Right   CATARACT EXTRACTION W/PHACO Left 11/18/2017   Procedure: CATARACT EXTRACTION PHACO AND INTRAOCULAR LENS PLACEMENT (IOC) LEFT IVA/TOPICAL;  Surgeon: Lockie Mola, MD;  Location: [facility];  Service: Ophthalmology;  Laterality: Left   COLONOSCOPY WITH PROPOFOL N/A 04/22/2013   Procedure: COLONOSCOPY WITH PROPOFOL;  Surgeon: Rachael Fee, MD;  Location: [facility];  Service: Endoscopy;  Laterality: N/A;   CORONARY ANGIOPLASTY WITH STENT PLACEMENT N/A 1996   CORONARY ARTERY BYPASS GRAFT N/A 08/13/2002   Procedure: 4v CORONARY ARTERY BYPASS GRAFTING; Location: [facility]; Surgeon: Katy Apo, MD   CYSTOSCOPY WITH INSERTION OF UROLIFT N/A 12/25/2021   Procedure: CYSTOSCOPY WITH INSERTION OF UROLIFT;  Surgeon: Irineo Axon  C, MD;  Location: [facility];  Service: Urology;  Laterality: N/A;   DG ANGIO AV SHUNT*L*     right and left upper arms   FASCIOTOMY  03/03/2012   Procedure: FASCIOTOMY;  Surgeon: Nicki Reaper, MD;  Location: [facility];  Service: Orthopedics;  Laterality: Right;  FASCIOTOMY RIGHT SMALL FINGER   FASCIOTOMY Left 08/17/2013   Procedure: FASCIOTOMY LEFT RING;  Surgeon: Nicki Reaper, MD;  Location: [facility];  Service: Orthopedics;  Laterality: Left;   INCISION AND DRAINAGE ABSCESS Left 10/15/2015   Procedure: INCISION AND DRAINAGE ABSCESS;  Surgeon: Leafy Ro, MD;  Location: [facility];  Service: General;  Laterality: Left;   KIDNEY TRANSPLANT  09/13/2010   cadaver--at [facility] HEART CATH N/A 11/15/2016   Procedure: RIGHT HEART CATH;  Surgeon: Iran Ouch, MD;  Location: [facility];  Service: Cardiovascular;   Laterality: N/A;   RIGHT HEART CATH N/A 05/03/2019   Procedure: RIGHT HEART CATH;  Surgeon: Laurey Morale, MD;  Location: [facility] CV [facility];  Service: Cardiovascular;  Laterality: N/A;   RIGHT HEART CATH N/A 04/04/2022   Procedure: RIGHT HEART CATH;  Surgeon: Laurey Morale, MD;  Location: [facility] CV LAB;  Service: Cardiovascular;  Laterality: N/A;   ROUX-EN-Y GASTRIC BYPASS N/A 2015   TYMPANIC MEMBRANE REPAIR Left 03/2010   VASECTOMY     WART FULGURATION N/A 05/31/2022   Procedure: FULGURATION ANAL WART;  Surgeon: Sung Amabile, DO;  Location: [facility];  Service: General;  Laterality: N/A;     Current Outpatient Medications  Medication Sig Dispense Refill   albuterol (PROVENTIL) (2.5 MG/3ML) 0.083% nebulizer solution Take 3 mLs (2.5 mg total) by nebulization every 6 (six) hours as needed for wheezing or shortness of breath. 150 mL 0   albuterol (VENTOLIN HFA) 108 (90 Base) MCG/ACT inhaler Inhale 2 puffs into the lungs every 6 (six) hours as needed for wheezing or shortness of breath. 6.7 g 5   amoxicillin-clavulanate (AUGMENTIN) 875-125 MG tablet Take 1 tablet by mouth 2 (two) times daily. 14 tablet 0   apixaban (ELIQUIS) 5 MG TABS tablet Take 1 tablet (5 mg total) by mouth 2 (two) times daily. 180 tablet 3   buPROPion (WELLBUTRIN SR) 150 MG 12 hr tablet Take 1 tablet (150 mg total) by mouth 2 (two) times daily. 180 tablet 3   cholecalciferol (VITAMIN D3) 25 MCG (1000 UNIT) tablet Take 1,000 Units by mouth at bedtime.     colchicine 0.6 MG tablet Take 1 tablet (0.6 mg total) by mouth every other day. 45 tablet 1   Continuous Glucose Sensor (FREESTYLE LIBRE 3 SENSOR) MISC Use one every 2 weeks. 6 each 3   cyclobenzaprine (FLEXERIL) 5 MG tablet Take by mouth.     dapagliflozin propanediol (FARXIGA) 10 MG TABS tablet Take 1 tablet (10 mg total) by mouth daily before breakfast. 90 tablet 3   febuxostat (ULORIC) 40 MG tablet Take 3 tablets (120 mg total) by mouth daily. 90 tablet 5    fluticasone (FLONASE) 50 MCG/ACT nasal spray Place 2 sprays into both nostrils daily. 16 g 11   gabapentin (NEURONTIN) 300 MG capsule Take 1 capsule (300 mg total) by mouth 2 (two) times daily. 180 capsule 3   hydrALAZINE (APRESOLINE) 50 MG tablet Take 1 tablet (50 mg total) by mouth every 8 (eight) hours. 90 tablet 6   HYDROcodone-acetaminophen (NORCO/VICODIN) 5-325 MG tablet Take 1-2 tablets by mouth every 6 (six)  hours as needed for moderate pain. 40 tablet 0   hydrocortisone 2.5 % cream Apply topically 3 (three) times daily as needed. 30 g 3   insulin glargine-yfgn (SEMGLEE) 100 UNIT/ML Pen Inject 40 Units into the skin daily. 45 mL 3   insulin lispro (HUMALOG KWIKPEN) 100 UNIT/ML KwikPen Inject 10-20 Units into the skin 3 (three) times daily. Per sliding scale     Insulin Pen Needle (TECHLITE PEN NEEDLES) 31G X 5 MM MISC 4 (four) times daily. 400 each 3   Insulin Pen Needle (TECHLITE PEN NEEDLES) 32G X 6 MM MISC Use 4 times a day 100 each 3   Insulin Pen Needle (UNIFINE PENTIPS) 31G X 5 MM MISC Use 4 times daily as directed 400 each 3   iron polysaccharides (FERREX 150) 150 MG capsule Take 1 capsule (150 mg total) by mouth 2 (two) times daily. 180 capsule 0   isosorbide mononitrate (IMDUR) 60 MG 24 hr tablet Take 0.5 tablets (30 mg total) by mouth daily. 30 tablet 6   lidocaine (LIDODERM) 5 % Place 1 patch onto the skin daily.PLACE 1 PATCH ONTO THE SKIN FOR 12 (TWELVE) HOURS. REMOVE & DISCARD PATCH WITHIN 12 HOURS OR AS DIRECTED BY MD (Patient taking differently: Place 1 patch onto the skin daily as needed.) 60 patch 11   lidocaine (XYLOCAINE) 5 % ointment Apply 1 Application topically 3 (three) times daily as needed. Apply pea size amount to area as needed for pain. 50 g 0   losartan (COZAAR) 25 MG tablet Take 1 tablet (25 mg total) by mouth daily. 90 tablet 3   metolazone (ZAROXOLYN) 2.5 MG tablet Take 1 tablet (2.5 mg total) by mouth 2 (two) times a week.     montelukast (SINGULAIR) 10 MG  tablet TAKE 1 TABLET BY MOUTH AT BEDTIME. (Patient taking differently: Take 10 mg by mouth at bedtime.) 90 tablet 3   Multiple Vitamin (MULTIVITAMIN WITH MINERALS) TABS tablet Take 1 tablet by mouth daily.     mycophenolate (MYFORTIC) 180 MG EC tablet Take 2 tablets (360 mg total) by mouth 2 (two) times daily. 360 tablet 3   omeprazole (PRILOSEC) 20 MG capsule TAKE 1 CAPSULE BY MOUTH DAILY. (Patient taking differently: Take 20 mg by mouth at bedtime.) 90 capsule 3   potassium chloride (KLOR-CON) 10 MEQ tablet Take 6 tablets (60 mEq total) by mouth every morning AND 4 tablets (40 mEq total) every evening. 300 tablet 6   predniSONE (DELTASONE) 5 MG tablet Take 1 tablet (5 mg total) by mouth daily. 90 tablet 3   rosuvastatin (CRESTOR) 10 MG tablet Take 1 tablet (10 mg total) by mouth daily. 90 tablet 3   spironolactone (ALDACTONE) 25 MG tablet Take 1 tablet (25 mg total) by mouth daily. 90 tablet 3   sulfamethoxazole-trimethoprim (BACTRIM) 400-80 MG tablet Take 1 tablet by mouth 3 (three) times a week every Monday, Wednesday, and Friday. 36 tablet 3   tacrolimus (PROGRAF) 1 MG capsule Take 2 capsules (2 mg total) by mouth 2 times daily. 120 capsule 11   tamsulosin (FLOMAX) 0.4 MG CAPS capsule Take 2 capsules (0.8 mg total) by mouth daily. 60 capsule 0   Testosterone 20.25 MG/ACT (1.62%) GEL Place 2 Pump onto the skin daily. 150 g 3   torsemide (DEMADEX) 100 MG tablet Take 1 tablet (100 mg total) by mouth 2 (two) times daily. 60 tablet 5   zinc sulfate 220 (50 Zn) MG capsule Take 220 mg by mouth daily.  No current facility-administered medications for this visit.    Allergies:   Contrast media [iodinated contrast media], Iodine, and Ibuprofen    Social History:  The patient  reports that he quit smoking about 28 years ago. His smoking use included cigars. He has never been exposed to tobacco smoke. He has never used smokeless tobacco. He reports that he does not currently use alcohol. He reports  that he does not use drugs.   Family History:  The patient's family history includes Breast cancer in his maternal grandmother; Diabetes in his maternal grandmother; Heart disease in his father; Kidney disease in his father; Kidney failure in his father; Liver cancer in his paternal grandmother and paternal uncle; Valvular heart disease in his mother.    ROS:  Please see the history of present illness.   Otherwise, review of systems are positive for none.   All other systems are reviewed and negative.    PHYSICAL EXAM: VS:  BP 110/64 (BP Location: Right Arm, Patient Position: Sitting, Cuff Size: Large)   Pulse 62   Ht 5\' 10"  (1.778 m)   Wt 263 lb 4 oz (119.4 kg)   SpO2 97%   BMI 37.77 kg/m  , BMI Body mass index is 37.77 kg/m. GEN: Well nourished, well developed, in no acute distress  HEENT: normal  Neck: No JVD,  no carotid bruits, or masses Cardiac: Irregularly irregular; no  rubs, or gallops, trace leg  edema . 2/6 systolic ejection murmur in the aortic area.   Respiratory:  clear to auscultation bilaterally, normal work of breathing GI: soft, nontender, , + BS.  Nondistended. MS: no deformity or atrophy  Skin: warm and dry, no rash.  Chronic stasis dermatitis in the left lower extremity. Neuro:  Strength and sensation are intact Psych: euthymic mood, full affect   EKG:  EKG is ordered today. The ekg ordered today demonstrates:  Atrial fibrillation Non-specific intra-ventricular conduction block Possible Lateral infarct (cited on or before 06-Jul-2022) Inferior infarct , age undetermined    Recent Labs: 06/25/2022: Magnesium 2.3 08/05/2022: ALT 14; TSH 2.077 08/20/2022: B Natriuretic Peptide 471.8 10/21/2022: NT-Pro BNP 4,402 11/01/2022: BUN 93; Creatinine, Ser 2.60; Potassium 3.4; Sodium 136 11/21/2022: Hemoglobin 12.8; Platelet Count 127    Lipid Panel    Component Value Date/Time   CHOL 90 05/09/2022 1218   CHOL 161 03/07/2015 0823   TRIG 140 05/09/2022 1218   HDL  37 (L) 05/09/2022 1218   HDL 38 (L) 03/07/2015 0823   CHOLHDL 2.4 05/09/2022 1218   VLDL 28 05/09/2022 1218   LDLCALC 25 05/09/2022 1218   LDLCALC 105 (H) 03/07/2015 0823   LDLDIRECT 41.9 04/20/2012 1618      Wt Readings from Last 3 Encounters:  12/03/22 263 lb 4 oz (119.4 kg)  11/18/22 256 lb (116.1 kg)  11/04/22 250 lb (113.4 kg)           No data to display            ASSESSMENT AND PLAN:  1.  Chronic systolic/diastolic heart failure: Appears to be euvolemic on current dose of torsemide.  No beta-blockers due to bradycardia.  Due to intermittent hypotension and dizziness, I decreased hydralazine to 25 mg twice daily.  2. Chronic atrial fibrillation: Rate is controlled without medications. He is tolerating anticoagulation with Eliquis.    3. Coronary artery disease involving native coronary arteries without angina: Continue medical therapy.    4. Hyperlipidemia: Continue treatment with rosuvastatin 10 mg daily.  I reviewed  most recent lipid profile which showed an LDL of 37.  5.  Status post kidney transplant: Most recent creatinine is around 2.5.  6.  Essential hypertension: Blood pressure is intermittently low.  Hydralazine was decreased.  7.  Preop cardiovascular evaluation: He has extensive cardiovascular history but has been stable.  He is at moderate risk from a cardiac standpoint.  Given underlying chronic kidney disease, Eliquis can be held 2-3 days before surgery.   Disposition: Continue regular follow-up at the advanced heart failure clinic with Dr. Shirlee Latch.  Follow-up with me in 1 year.  Signed,  Lorine Bears, MD  12/03/2022 2:47 PM    [facility]

## 2022-12-05 ENCOUNTER — Encounter: Payer: Commercial Managed Care - PPO | Attending: Physician Assistant | Admitting: Physician Assistant

## 2022-12-05 DIAGNOSIS — S81812A Laceration without foreign body, left lower leg, initial encounter: Secondary | ICD-10-CM | POA: Diagnosis not present

## 2022-12-05 DIAGNOSIS — E1122 Type 2 diabetes mellitus with diabetic chronic kidney disease: Secondary | ICD-10-CM | POA: Insufficient documentation

## 2022-12-05 DIAGNOSIS — Z841 Family history of disorders of kidney and ureter: Secondary | ICD-10-CM | POA: Diagnosis not present

## 2022-12-05 DIAGNOSIS — I2510 Atherosclerotic heart disease of native coronary artery without angina pectoris: Secondary | ICD-10-CM | POA: Diagnosis not present

## 2022-12-05 DIAGNOSIS — Z833 Family history of diabetes mellitus: Secondary | ICD-10-CM | POA: Diagnosis not present

## 2022-12-05 DIAGNOSIS — L97812 Non-pressure chronic ulcer of other part of right lower leg with fat layer exposed: Secondary | ICD-10-CM | POA: Diagnosis not present

## 2022-12-05 DIAGNOSIS — I890 Lymphedema, not elsewhere classified: Secondary | ICD-10-CM | POA: Insufficient documentation

## 2022-12-05 DIAGNOSIS — I87333 Chronic venous hypertension (idiopathic) with ulcer and inflammation of bilateral lower extremity: Secondary | ICD-10-CM | POA: Diagnosis not present

## 2022-12-05 DIAGNOSIS — Z7901 Long term (current) use of anticoagulants: Secondary | ICD-10-CM | POA: Insufficient documentation

## 2022-12-05 DIAGNOSIS — E11622 Type 2 diabetes mellitus with other skin ulcer: Secondary | ICD-10-CM | POA: Diagnosis not present

## 2022-12-05 DIAGNOSIS — Z940 Kidney transplant status: Secondary | ICD-10-CM | POA: Insufficient documentation

## 2022-12-05 DIAGNOSIS — I132 Hypertensive heart and chronic kidney disease with heart failure and with stage 5 chronic kidney disease, or end stage renal disease: Secondary | ICD-10-CM | POA: Diagnosis not present

## 2022-12-05 DIAGNOSIS — I5042 Chronic combined systolic (congestive) and diastolic (congestive) heart failure: Secondary | ICD-10-CM | POA: Diagnosis not present

## 2022-12-05 DIAGNOSIS — I480 Paroxysmal atrial fibrillation: Secondary | ICD-10-CM | POA: Insufficient documentation

## 2022-12-05 DIAGNOSIS — Z87891 Personal history of nicotine dependence: Secondary | ICD-10-CM | POA: Diagnosis not present

## 2022-12-05 DIAGNOSIS — Z8249 Family history of ischemic heart disease and other diseases of the circulatory system: Secondary | ICD-10-CM | POA: Insufficient documentation

## 2022-12-05 DIAGNOSIS — L97822 Non-pressure chronic ulcer of other part of left lower leg with fat layer exposed: Secondary | ICD-10-CM | POA: Insufficient documentation

## 2022-12-05 DIAGNOSIS — N186 End stage renal disease: Secondary | ICD-10-CM | POA: Insufficient documentation

## 2022-12-05 NOTE — Progress Notes (Signed)
Carl, [name] F ([id number]) [id number] Page 1 of 14 Visit Report for 12/05/2022 Chief Complaint Document Details Patient Name: Date of Service: [name], [name] 12/05/2022 8:15 A M Medical Record Number: [id number] Patient Account Number: [id number] Date of Birth/Sex: Treating RN: 1960/11/27 (62 y.o. [name] Primary Care Provider: [name] Other Clinician: Referring Provider: Treating Provider/Extender: [name] Self, Referral Weeks in Treatment: 0 Information Obtained from: Patient Chief Complaint Bilateral LE Ulcers Electronic Signature(s) Signed: 12/05/2022 9:32:48 AM By: [name] PA-C Entered By: [name] on 12/05/2022 06:32:48 -------------------------------------------------------------------------------- Debridement Details Patient Name: Date of Service: [name] 12/05/2022 8:15 A M Medical Record Number: [id number] Patient Account Number: [id number] Date of Birth/Sex: Treating RN: 03-22-1961 (62 y.o. [name] Primary Care Provider: [name] Other Clinician: Referring Provider: Treating Provider/Extender: [name] Self, Referral Weeks in Treatment: 0 Debridement Performed for Assessment: Wound #7 Right Lower Leg Performed By: Physician [name], PA-C Debridement Type: Debridement Severity of Tissue Pre Debridement: Fat layer exposed Level of Consciousness (Pre-procedure): Awake and Alert Pre-procedure Verification/Time Out Yes - 09:36 Taken: Pain Control: Lidocaine 4% T opical Solution Percent of Wound Bed Debrided: 100% T Area Debrided (cm): otal 5.02 Tissue and other material debrided: Viable, Non-Viable, Eschar, Slough, Subcutaneous, Slough Level: Skin/Subcutaneous Tissue Debridement Description: Excisional Instrument: Curette Bleeding: Moderate Hemostasis Achieved: Pressure Response to Treatment: Procedure was tolerated well Level of Consciousness (Post- Awake and  Alert procedure): EMRICK, SPIRES ([id number]) [id number] Page 2 of 14 Post Debridement Measurements of Total Wound Length: (cm) 1.6 Width: (cm) 4 Depth: (cm) 0.1 Volume: (cm) 0.503 Character of Wound/Ulcer Post Debridement: Stable Severity of Tissue Post Debridement: Fat layer exposed Post Procedure Diagnosis Same as Pre-procedure Electronic Signature(s) Signed: 12/05/2022 3:43:07 PM By: [name] Signed: 12/05/2022 4:31:22 PM By: [name] PA-C Entered By: [name] on 12/05/2022 06:38:31 -------------------------------------------------------------------------------- Debridement Details Patient Name: Date of Service: [name] 12/05/2022 8:15 A M Medical Record Number: [id number] Patient Account Number: [id number] Date of Birth/Sex: Treating RN: 25-Mar-1961 (62 y.o. [name] Primary Care Provider: [name] Other Clinician: Referring Provider: Treating Provider/Extender: [name] Self, Referral Weeks in Treatment: 0 Debridement Performed for Assessment: Wound #8 Left,Circumferential Lower Leg Performed By: Physician [name], PA-C Debridement Type: Chemical/Enzymatic/Mechanical Agent Used: saline gauze Level of Consciousness (Pre-procedure): Awake and Alert Pre-procedure Verification/Time Out Yes - 09:40 Taken: Pain Control: Lidocaine 4% Topical Solution Percent of Wound Bed Debrided: Instrument: Other : saline gauze Bleeding: None Response to Treatment: Procedure was tolerated well Level of Consciousness (Post- Awake and Alert procedure): Post Debridement Measurements of Total Wound Length: (cm) 14.5 Width: (cm) 11.5 Depth: (cm) 0.1 Volume: (cm) 13.097 Character of Wound/Ulcer Post Debridement: Stable Post Procedure Diagnosis Same as Pre-procedure Electronic Signature(s) Signed: 12/05/2022 10:24:00 AM By: [name] Signed: 12/05/2022 4:31:22 PM By: [name] PA-C Entered By: [name] on 12/05/2022 07:24:00 Isabella Bowens ([id number]) [id number] Page 3 of 14 -------------------------------------------------------------------------------- HPI Details Patient Name: Date of Service: [name], Carl 12/05/2022 8:15 A M Medical Record Number: [id number] Patient Account Number: [id number] Date of Birth/Sex: Treating RN: 04-16-1960 (62 y.o. [name] Primary Care Provider: [name] Other Clinician: Referring Provider: Treating Provider/Extender: [name] Self, Referral Weeks in Treatment: 0 History of Present Illness Location: left anterior shin and left medial calf Quality: Patient reports experiencing a dull pain to affected area(s). Severity: Patient states wound are getting worse. Duration: Patient has had the wound for > 3 months prior to  seeking treatment at the wound center Timing: Pain in wound is Intermittent (comes and goes Context: The wound appeared gradually over time Modifying Factors: Other treatment(s) tried include:had surgery on the left anterior shin for a hematoma which has been nonhealing for the last 8 months ssociated Signs and Symptoms: Patient reports having increase swelling. A HPI Description: 62 year old diabetic was last hemoglobin A1c was 7.2% has been reviewed today with 2 ulcerated areas in his left lower extremity which she's had for about 8 months and the one most recently was started as a blister for 2 weeks. he has a past medical history of atrial fibrillation, coronary artery disease, CHF, chronic venous stasis dermatitis, diabetes mellitus, hypertension, sleep apnea and is status post kidney transplant in 2012. He is a former smoker and quit cigars in 1996. In the remote past he's had some arterial and venous duplex studies done but does not recall the results no was ever treated for venous insufficiency. 06/10/2016 -- -- had a office visit at [facility] seen by the  physicians assistant Cleda Daub. Patient underwent an ABI which showed medial calcification, normal tibial artery Doppler waveform, PPG waveforms and toe brachial indices suggest normal arterial perfusion to bilateral lower extremities. right TBI was 0.97 and left was 0.84 Bilateral venous duplex was notable for venous reflux in the left greater saphenous vein and popliteal veins.. A left lower extremity endovenous laser ablation was recommended. 06/27/2016 -- I understand he spoke to his insurance company and there is a 3 month waiting period with conservative therapy prior to authorizing surgical intervention. 07/12/16- he is here for follow-up evaluation of a left lower extremity ulcer. He did bring his compression stockings today. He is voicing no complaints or concerns Readmission: 04/16/2021 upon evaluation today patient appears for reevaluation here in the clinic he was last seen in April 2018. At that time he was having issues with venous stasis ulcers and that again is what he is dealing with today as well. Fortunately I do not see any signs of active infection at this time which is great news and overall very pleased in that regard. Nonetheless he does have some areas of eschar which is going require debridement to clear away some of the necrotic debris so that we can hopefully get this healed as quickly as possible. That was discussed with the patient today as well. The patient does have diabetes mellitus type 2 for which she does have a hemoglobin A1c of 7.27 November 2020. He also has been on Keflex for what was felt to be cellulitis that seems to be doing better these wounds began on March 08, 2021. The patient also has a history of lymphedema, chronic venous insufficiency, and is a kidney transplant patient. 04/24/2021 upon evaluation today patient appears to be doing okay in regard to his wounds. Fortunately there does not appear to be any evidence of active infection  locally nor systemically at this time which is good news. Nonetheless he was not able to keep the compression wraps on I think this is going be a little bit more problem he tells me his anxiety was to the point he just was not able to do it. I do understand but at the same time that is good to make things somewhat difficult to be honest. 04/30/2021 upon evaluation today patient appears to be doing well with regard to his wounds on his legs. Fortunately I do not see any signs of active infection locally or systemically at this  time which is great news. No fevers, chills, nausea, vomiting, or diarrhea. 05/07/2021 upon evaluation patient appears to be doing well with regard to his wounds. He does have a new area on the right leg due to a skin tear as he was pulling up his compression socks. This obviously is a little frustrating for him everything else seems to be doing much better his blood flow was good I did print off the test today shows that he has excellent TBI's which is also news. 05/14/2021 upon evaluation today patient appears to be doing better in fact in regard to his left leg he is completely healed in regard to the right leg he still continues to have some open areas in 2 spots but both are doing better in my opinion which is great news. Fortunately I do not see any signs of active infection locally or systemically at this time which is great news as well. 05/21/2021 upon evaluation today patient appears to be doing well with regard to his wounds. I feel like he is actually showing some signs of improvement which is great news. Fortunately I do not see any evidence of active infection locally nor systemically at this point which is great news. No fevers, chills, nausea, vomiting, or diarrhea. 05/28/2021 upon evaluation today patient to be doing awesome in regard to his wound. He has been tolerating the dressing changes without complication. Fortunately there does not appear to be any signs of  active infection and both wounds are actually showing signs of excellent improvement which is great news. No fevers, chills, nausea, vomiting, or diarrhea. 3/13; patient presents for follow-up. He has no issues or complaints today. Denies signs of infection. He has been using collagen for dressing changes and using his compression stocking daily. LADAINIAN, BIEDRON F ([id number]) [id number] Page 4 of 14 06/11/2021 upon evaluation patient appears to be doing very well in regard to the wounds on his legs. He has been tolerating the dressing changes without complication and overall I do feel like that he is very close to complete resolution. I do not see any signs of active infection locally nor systemically which is great news. 06/18/2021 upon evaluation today patient appears to be doing well currently in regard to his wounds. In fact the anterior wound is actually closed the wound on the right medial region is actually very close to being completely closed but still has a small opening today. I think he is at the point where hopefully by next week this will be resolved. Nonetheless I do feel like that he is still having quite a bit of an issue here all things considered. Readmission: 12-05-2022 upon evaluation today patient appears to be doing poorly currently in regard to his wound. With that being said I do believe that the patient is currently having some issues again has been little over a year since I last saw him March 2023. At this point he tells me he has 2 issues 1 where he hit his left leg on a piece of furniture which I think is the primary concern there. The other issue is that of just having blisters popping up on his right leg. He tells me he is really not wearing compression socks regularly maybe 3 times per week when I pinned him down on it is the most that he is wearing these and not even certain if that regular or not to be perfectly honest. With that being said I  do believe that he would benefit from  a likely the compression therapy but at the same time he has such tremendous pain in the past he is always refused this therefore I think Tubigrip is probably the best way to go at this point considering he cannot even get his compression socks on currently due to the pain. Patient does have a past medical history which is significant for chronic venous hypertension, lymphedema, diabetes mellitus type 2, he is a kidney transplant patient, has hypertension, congestive heart failure, atrial fibrillation, and is on long-term anticoagulant therapy in the form of Eliquis. Electronic Signature(s) Signed: 12/05/2022 4:04:12 PM By: [name] PA-C Entered By: [name] on 12/05/2022 13:04:12 -------------------------------------------------------------------------------- Physical Exam Details Patient Name: Date of Service: HABEN, BILLINGSLEY F. 12/05/2022 8:15 A M Medical Record Number: [id number] Patient Account Number: [id number] Date of Birth/Sex: Treating RN: 12/25/1960 (62 y.o. [name] Primary Care Provider: [name] Other Clinician: Referring Provider: Treating Provider/Extender: [name] Self, Referral Weeks in Treatment: 0 Constitutional sitting or standing blood pressure is within target range for patient.. pulse regular and within target range for patient.Marland Kitchen respirations regular, non-labored and within target range for patient.Marland Kitchen temperature within target range for patient.. Well-nourished and well-hydrated in no acute distress. Eyes conjunctiva clear no eyelid edema noted. pupils equal round and reactive to light and accommodation. Ears, Nose, Mouth, and Throat no gross abnormality of ear auricles or external auditory canals. normal hearing noted during conversation. mucus membranes moist. Respiratory normal breathing without difficulty. Cardiovascular 2+ dorsalis pedis/posterior tibialis pulses. 1+ pitting edema of the bilateral  lower extremities. Musculoskeletal normal gait and posture. no significant deformity or arthritic changes, no loss or range of motion, no clubbing. Psychiatric this patient is able to make decisions and demonstrates good insight into disease process. Alert and Oriented x 3. pleasant and cooperative. Notes And some areas in other areas he had some necrotic tissue. In general I believe that his wounds really do not appear to be too poorly overall all things considered. I did actually perform some debridement in regard to the right leg he tolerated this without complication and postdebridement wound bed appears to be doing much better which is great news. Upon inspection patient's wound bed actually showed signs of good granulation and epithelization at this point. Electronic Signature(s) Signed: 12/05/2022 4:04:47 PM By: [name] PA-C Entered By: [name] on 12/05/2022 13:04:46 [name] ([id number]) [id number] Page 5 of 14 -------------------------------------------------------------------------------- Physician Orders Details Patient Name: Date of Service: CHADI, MIDYETTE 12/05/2022 8:15 A M Medical Record Number: [id number] Patient Account Number: [id number] Date of Birth/Sex: Treating RN: 08/09/1960 (62 y.o. [name] Primary Care Provider: [name] Other Clinician: Referring Provider: Treating Provider/Extender: [name] Self, Referral Weeks in Treatment: 0 Verbal / Phone Orders: No Diagnosis Coding ICD-10 Coding Code Description [phone] Chronic venous hypertension (idiopathic) with ulcer and inflammation of bilateral lower extremity I89.0 Lymphedema, not elsewhere classified E11.622 Type 2 diabetes mellitus with other skin ulcer L97.812 Non-pressure chronic ulcer of other part of right lower leg with fat layer exposed S81.812A Laceration without foreign body, left lower leg, initial encounter L97.822 Non-pressure chronic  ulcer of other part of left lower leg with fat layer exposed Z94.0 Kidney transplant status I10 Essential (primary) hypertension I50.42 Chronic combined systolic (congestive) and diastolic (congestive) heart failure I48.0 Paroxysmal atrial fibrillation Z79.01 Long term (current) use of anticoagulants Follow-up Appointments Return Appointment in 1 week. Bathing/ Shower/ Hygiene May shower; gently cleanse wound with antibacterial soap, rinse and pat dry prior to  dressing wounds No tub bath. Anesthetic (Use 'Patient Medications' Section for Anesthetic Order Entry) Lidocaine applied to wound bed Edema Control - Lymphedema / Segmental Compressive Device / Other Tubigrip single layer applied. - bilaterally size D Elevate, Exercise Daily and A void Standing for Long Periods of Time. Elevate leg(s) parallel to the floor when sitting. DO YOUR BEST to sleep in the bed at night. DO NOT sleep in your recliner. Long hours of sitting in a recliner leads to swelling of the legs and/or potential wounds on your backside. Wound Treatment Wound #7 - Lower Leg Wound Laterality: Right Cleanser: Byram Ancillary Kit - 15 Day Supply (DME) (Generic) 3 x Per Week/15 Days Discharge Instructions: Use supplies as instructed; Kit contains: (15) Saline Bullets; (15) 3x3 Gauze; 15 pr Gloves Cleanser: Soap and Water 3 x Per Week/15 Days Discharge Instructions: Gently cleanse wound with antibacterial soap, rinse and pat dry prior to dressing wounds Cleanser: Wound Cleanser 3 x Per Week/15 Days Discharge Instructions: Wash your hands with soap and water. Remove old dressing, discard into plastic bag and place into trash. Cleanse the wound with Wound Cleanser prior to applying a clean dressing using gauze sponges, not tissues or cotton balls. Do not scrub or use excessive force. Pat dry using gauze sponges, not tissue or cotton balls. Prim Dressing: Hydrofera Blue Ready Transfer Foam, 2.5x2.5 (in/in) (DME) (Generic) 3 x  Per Week/15 Days ary Discharge Instructions: Apply Hydrofera Blue Ready to wound bed as directed Secondary Dressing: ABD Pad 5x9 (in/in) (DME) (Generic) 3 x Per [address] JERMERE, MESCHKE ([id number]) [phone].pdf Page 6 of 14 Discharge Instructions: Cover with ABD pad Secondary Dressing: Zetuvit Plus 4x4 (in/in) (Generic) 3 x Per Week/15 Days Secured With: Medipore T - 35M Medipore H Soft Cloth Surgical T ape ape, 2x2 (in/yd) (DME) (Generic) 3 x Per Week/15 Days Secured With: [employer] Sterile or Non-Sterile 6-ply 4.5x4 (yd/yd) (DME) (Generic) 3 x Per Week/15 Days Discharge Instructions: Apply Kerlix as directed Secured With: Tubigrip Size D, 3x10 (in/yd) 3 x Per Week/15 Days Wound #8 - Lower Leg Wound Laterality: Left, Circumferential Cleanser: Soap and Water 3 x Per Week/15 Days Discharge Instructions: Gently cleanse wound with antibacterial soap, rinse and pat dry prior to dressing wounds Cleanser: Wound Cleanser 3 x Per Week/15 Days Discharge Instructions: Wash your hands with soap and water. Remove old dressing, discard into plastic bag and place into trash. Cleanse the wound with Wound Cleanser prior to applying a clean dressing using gauze sponges, not tissues or cotton balls. Do not scrub or use excessive force. Pat dry using gauze sponges, not tissue or cotton balls. Prim Dressing: Hydrofera Blue Ready Transfer Foam, 4x5 (in/in) (DME) (Generic) 3 x Per Week/15 Days ary Discharge Instructions: Apply Hydrofera Blue Ready to wound bed as directed Secondary Dressing: ABD Pad 5x9 (in/in) (DME) (Generic) 3 x Per Week/15 Days Discharge Instructions: Cover with ABD pad Secondary Dressing: Zetuvit Plus 4x8 (in/in) (Generic) 3 x Per Week/15 Days Secured With: Medipore T - 35M Medipore H Soft Cloth Surgical T ape ape, 2x2 (in/yd) (DME) (Generic) 3 x Per Week/15 Days Secured With: Kerlix Roll Sterile or Non-Sterile 6-ply 4.5x4 (yd/yd) (DME) (Generic) 3 x Per  Week/15 Days Discharge Instructions: Apply Kerlix as directed Secured With: Tubigrip Size D, 3x10 (in/yd) 3 x Per Week/15 Days Electronic Signature(s) Signed: 12/05/2022 3:43:07 PM By: [name] Signed: 12/05/2022 4:31:22 PM By: [name] PA-C Previous Signature: 12/05/2022 10:23:12 AM Version By: [name] Entered By: [name] on 12/05/2022  07:25:35 -------------------------------------------------------------------------------- Problem List Details Patient Name: Date of Service: ACENCION, TONI 12/05/2022 8:15 A M Medical Record Number: [id number] Patient Account Number: [id number] Date of Birth/Sex: Treating RN: October 19, 1960 (62 y.o. [name] Primary Care Provider: [name] Other Clinician: Referring Provider: Treating Provider/Extender: [name] Self, Referral Weeks in Treatment: 0 Active Problems ICD-10 Encounter Code Description Active Date MDM Diagnosis I87.333 Chronic venous hypertension (idiopathic) with ulcer and inflammation of 12/05/2022 No Yes bilateral lower extremity NIJEL, AMBURGEY ([id number]) [id number].pdf Page 7 of 14 I89.0 Lymphedema, not elsewhere classified 12/05/2022 No Yes E11.622 Type 2 diabetes mellitus with other skin ulcer 12/05/2022 No Yes L97.812 Non-pressure chronic ulcer of other part of right lower leg with fat layer 12/05/2022 No Yes exposed S81.812A Laceration without foreign body, left lower leg, initial encounter 12/05/2022 No Yes L97.822 Non-pressure chronic ulcer of other part of left lower leg with fat layer exposed9/02/2023 No Yes Z94.0 Kidney transplant status 12/05/2022 No Yes I10 Essential (primary) hypertension 12/05/2022 No Yes I50.42 Chronic combined systolic (congestive) and diastolic (congestive) heart failure 12/05/2022 No Yes I48.0 Paroxysmal atrial fibrillation 12/05/2022 No Yes Z79.01 Long term (current) use of anticoagulants 12/05/2022 No Yes Inactive Problems Resolved  Problems Electronic Signature(s) Signed: 12/05/2022 9:32:39 AM By: [name] PA-C Previous Signature: 12/05/2022 9:31:38 AM Version By: [name] PA-C Entered By: [name] on 12/05/2022 06:32:38 -------------------------------------------------------------------------------- Progress Note Details Patient Name: Date of Service: [name] 12/05/2022 8:15 A M Medical Record Number: [id number] Patient Account Number: [id number] Date of Birth/Sex: Treating RN: [birth year] (62 y.o. [name] Primary Care Provider: [name] Other Clinician: Referring Provider: Treating Provider/Extender: [name] Self, Referral Weeks in Treatment: [address] ALLISTER, FRYLING F ([id number]) [id number] Page 8 of 14 Chief Complaint Information obtained from Patient Bilateral LE Ulcers History of Present Illness (HPI) The following HPI elements were documented for the patient's wound: Location: left anterior shin and left medial calf Quality: Patient reports experiencing a dull pain to affected area(s). Severity: Patient states wound are getting worse. Duration: Patient has had the wound for > 3 months prior to seeking treatment at the wound center Timing: Pain in wound is Intermittent (comes and goes Context: The wound appeared gradually over time Modifying Factors: Other treatment(s) tried include:had surgery on the left anterior shin for a hematoma which has been nonhealing for the last 8 months Associated Signs and Symptoms: Patient reports having increase swelling. 62 year old diabetic was last hemoglobin A1c was 7.2% has been reviewed today with 2 ulcerated areas in his left lower extremity which she's had for about 8 months and the one most recently was started as a blister for 2 weeks. he has a past medical history of atrial fibrillation, coronary artery disease, CHF, chronic venous stasis dermatitis, diabetes mellitus, hypertension, sleep apnea  and is status post kidney transplant in 2012. He is a former smoker and quit cigars in 1996. In the remote past he's had some arterial and venous duplex studies done but does not recall the results no was ever treated for venous insufficiency. 06/10/2016 -- -- had a office visit at [facility] seen by the physicians assistant Cleda Daub. Patient underwent an ABI which showed medial calcification, normal tibial artery Doppler waveform, PPG waveforms and toe brachial indices suggest normal arterial perfusion to bilateral lower extremities. right TBI was 0.97 and left was 0.84 Bilateral venous duplex was notable for venous reflux in the left greater saphenous vein and popliteal veins.. A left lower extremity endovenous  laser ablation was recommended. 06/27/2016 -- I understand he spoke to his insurance company and there is a 3 month waiting period with conservative therapy prior to authorizing surgical intervention. 07/12/16- he is here for follow-up evaluation of a left lower extremity ulcer. He did bring his compression stockings today. He is voicing no complaints or concerns Readmission: 04/16/2021 upon evaluation today patient appears for reevaluation here in the clinic he was last seen in April 2018. At that time he was having issues with venous stasis ulcers and that again is what he is dealing with today as well. Fortunately I do not see any signs of active infection at this time which is great news and overall very pleased in that regard. Nonetheless he does have some areas of eschar which is going require debridement to clear away some of the necrotic debris so that we can hopefully get this healed as quickly as possible. That was discussed with the patient today as well. The patient does have diabetes mellitus type 2 for which she does have a hemoglobin A1c of 7.27 November 2020. He also has been on Keflex for what was felt to be cellulitis that seems to be doing better  these wounds began on March 08, 2021. The patient also has a history of lymphedema, chronic venous insufficiency, and is a kidney transplant patient. 04/24/2021 upon evaluation today patient appears to be doing okay in regard to his wounds. Fortunately there does not appear to be any evidence of active infection locally nor systemically at this time which is good news. Nonetheless he was not able to keep the compression wraps on I think this is going be a little bit more problem he tells me his anxiety was to the point he just was not able to do it. I do understand but at the same time that is good to make things somewhat difficult to be honest. 04/30/2021 upon evaluation today patient appears to be doing well with regard to his wounds on his legs. Fortunately I do not see any signs of active infection locally or systemically at this time which is great news. No fevers, chills, nausea, vomiting, or diarrhea. 05/07/2021 upon evaluation patient appears to be doing well with regard to his wounds. He does have a new area on the right leg due to a skin tear as he was pulling up his compression socks. This obviously is a little frustrating for him everything else seems to be doing much better his blood flow was good I did print off the test today shows that he has excellent TBI's which is also news. 05/14/2021 upon evaluation today patient appears to be doing better in fact in regard to his left leg he is completely healed in regard to the right leg he still continues to have some open areas in 2 spots but both are doing better in my opinion which is great news. Fortunately I do not see any signs of active infection locally or systemically at this time which is great news as well. 05/21/2021 upon evaluation today patient appears to be doing well with regard to his wounds. I feel like he is actually showing some signs of improvement which is great news. Fortunately I do not see any evidence of active infection  locally nor systemically at this point which is great news. No fevers, chills, nausea, vomiting, or diarrhea. 05/28/2021 upon evaluation today patient to be doing awesome in regard to his wound. He has been tolerating the dressing changes without complication. Fortunately  there does not appear to be any signs of active infection and both wounds are actually showing signs of excellent improvement which is great news. No fevers, chills, nausea, vomiting, or diarrhea. 3/13; patient presents for follow-up. He has no issues or complaints today. Denies signs of infection. He has been using collagen for dressing changes and using his compression stocking daily. 06/11/2021 upon evaluation patient appears to be doing very well in regard to the wounds on his legs. He has been tolerating the dressing changes without complication and overall I do feel like that he is very close to complete resolution. I do not see any signs of active infection locally nor systemically which is great news. 06/18/2021 upon evaluation today patient appears to be doing well currently in regard to his wounds. In fact the anterior wound is actually closed the wound on the right medial region is actually very close to being completely closed but still has a small opening today. I think he is at the point where hopefully by next week this will be resolved. Nonetheless I do feel like that he is still having quite a bit of an issue here all things considered. Readmission: 12-05-2022 upon evaluation today patient appears to be doing poorly currently in regard to his wound. With that being said I do believe that the patient is currently having some issues again has been little over a year since I last saw him March 2023. At this point he tells me he has 2 issues 1 where he hit his left leg on a piece of furniture which I think is the primary concern there. The other issue is that of just having blisters popping up on his right leg. He tells me  he is really not wearing compression socks regularly maybe 3 times per week when I pinned him down on it is the most that he is wearing these and not even certain if that regular or not to be perfectly honest. With that being said I do believe that he would benefit from a likely the compression therapy but at the same time he has such tremendous pain in the past he is always refused this therefore I think Tubigrip is probably the best way to go at this point considering he cannot even get his compression socks on currently due to the pain. Patient does have a past medical history which is significant for chronic venous hypertension, lymphedema, diabetes mellitus type 2, he is a kidney transplant patient, has hypertension, congestive heart failure, atrial fibrillation, and is on long-term anticoagulant therapy in the form of Eliquis. LASHONE, LODUCA F ([id number]) [id number] Page 9 of 14 Patient History Information obtained from Patient. Allergies morphine, Iodinated Contrast Media, iodine, ibuprofen Family History Cancer - Maternal Grandparents,Paternal Grandparents, Diabetes - Maternal Grandparents, Heart Disease - Father, Kidney Disease - Father. Social History Never smoker - cigars in past, Marital Status - Married, Alcohol Use - Never, Drug Use - No History, Caffeine Use - Daily. Medical History Eyes Patient has history of Cataracts - HX Hematologic/Lymphatic Patient has history of Anemia, Lymphedema Respiratory Patient has history of Asthma, Sleep Apnea Cardiovascular Patient has history of Arrhythmia - A FIB, Congestive Heart Failure, Coronary Artery Disease, Hypertension Endocrine Patient has history of Type II Diabetes Genitourinary Patient has history of End Stage Renal Disease Musculoskeletal Patient has history of Gout, Osteoarthritis Neurologic Patient has history of Neuropathy Patient is treated with Insulin. Blood sugar is tested. Medical A  Surgical History Notes nd Cardiovascular chronic venous insufficiency  Genitourinary CKD stage 4, renal transplant Review of Systems (ROS) Constitutional Symptoms (General Health) Complains or has symptoms of Chills. Eyes Complains or has symptoms of Glasses / Contacts. Ear/Nose/Mouth/Throat Denies complaints or symptoms of Difficult clearing ears, Sinusitis. Gastrointestinal Denies complaints or symptoms of Frequent diarrhea, Nausea, Vomiting. Immunological Denies complaints or symptoms of Hives, Itching. Integumentary (Skin) Complains or has symptoms of Wounds, Breakdown, Swelling. Psychiatric Denies complaints or symptoms of Anxiety, Claustrophobia. Objective Constitutional sitting or standing blood pressure is within target range for patient.. pulse regular and within target range for patient.Marland Kitchen respirations regular, non-labored and within target range for patient.Marland Kitchen temperature within target range for patient.. Well-nourished and well-hydrated in no acute distress. Vitals Time Taken: 8:43 AM, Height: 70 in, Source: Stated, Weight: 253 lbs, Source: Stated, BMI: 36.3, Temperature: 97.7 F, Pulse: 77 bpm, Respiratory Rate: 18 breaths/min, Blood Pressure: 134/75 mmHg. Eyes conjunctiva clear no eyelid edema noted. pupils equal round and reactive to light and accommodation. Ears, Nose, Mouth, and Throat no gross abnormality of ear auricles or external auditory canals. normal hearing noted during conversation. mucus membranes moist. Respiratory normal breathing without difficulty. Cardiovascular 2+ dorsalis pedis/posterior tibialis pulses. 1+ pitting edema of the bilateral lower extremities. Musculoskeletal normal gait and posture. no significant deformity or arthritic changes, no loss or range of motion, no clubbing. Psychiatric CAMILE, RUDDLE F ([id number]) [id number] Page 10 of 14 this patient is able to make decisions and demonstrates good insight into  disease process. Alert and Oriented x 3. pleasant and cooperative. General Notes: And some areas in other areas he had some necrotic tissue. In general I believe that his wounds really do not appear to be too poorly overall all things considered. I did actually perform some debridement in regard to the right leg he tolerated this without complication and postdebridement wound bed appears to be doing much better which is great news. Upon inspection patient's wound bed actually showed signs of good granulation and epithelization at this point. Integumentary (Hair, Skin) Wound #7 status is Open. Original cause of wound was Blister. The date acquired was: 11/21/2022. The wound is located on the Right Lower Leg. The wound measures 1.6cm length x 4cm width x 0.1cm depth; 5.027cm^2 area and 0.503cm^3 volume. There is Fat Layer (Subcutaneous Tissue) exposed. There is no tunneling or undermining noted. There is a medium amount of serosanguineous drainage noted. There is small (1-33%) red granulation within the wound bed. There is a large (67-100%) amount of necrotic tissue within the wound bed including Adherent Slough. Wound #8 status is Open. Original cause of wound was Skin T ear/Laceration. The date acquired was: 11/30/2022. The wound is located on the Left,Circumferential Lower Leg. The wound measures 14.5cm length x 11.5cm width x 0.1cm depth; 130.965cm^2 area and 13.097cm^3 volume. There is Fat Layer (Subcutaneous Tissue) exposed. There is no tunneling or undermining noted. There is a medium amount of serosanguineous drainage noted. There is medium (34-66%) red, pink granulation within the wound bed. There is a medium (34-66%) amount of necrotic tissue within the wound bed including Adherent Slough. Assessment Active Problems ICD-10 Chronic venous hypertension (idiopathic) with ulcer and inflammation of bilateral lower extremity Lymphedema, not elsewhere classified Type 2 diabetes mellitus with other  skin ulcer Non-pressure chronic ulcer of other part of right lower leg with fat layer exposed Laceration without foreign body, left lower leg, initial encounter Non-pressure chronic ulcer of other part of left lower leg with fat layer exposed Kidney transplant status Essential (primary) hypertension Chronic combined systolic (congestive)  and diastolic (congestive) heart failure Paroxysmal atrial fibrillation Long term (current) use of anticoagulants Procedures Wound #7 Pre-procedure diagnosis of Wound #7 is a Diabetic Wound/Ulcer of the Lower Extremity located on the Right Lower Leg .Severity of Tissue Pre Debridement is: Fat layer exposed. There was a Excisional Skin/Subcutaneous Tissue Debridement with a total area of 5.02 sq cm performed by [name], PA-C. With the following instrument(s): Curette to remove Viable and Non-Viable tissue/material. Material removed includes Eschar, Subcutaneous Tissue, and Slough after achieving pain control using Lidocaine 4% T opical Solution. No specimens were taken. A time out was conducted at 09:36, prior to the start of the procedure. A Moderate amount of bleeding was controlled with Pressure. The procedure was tolerated well. Post Debridement Measurements: 1.6cm length x 4cm width x 0.1cm depth; 0.503cm^3 volume. Character of Wound/Ulcer Post Debridement is stable. Severity of Tissue Post Debridement is: Fat layer exposed. Post procedure Diagnosis Wound #7: Same as Pre-Procedure Wound #8 Pre-procedure diagnosis of Wound #8 is a Skin T located on the Left,Circumferential Lower Leg . There was a Chemical/Enzymatic/Mechanical debridement ear performed by [name], PA-C. With the following instrument(s): saline gauze after achieving pain control using Lidocaine 4% Topical Solution. Other agent used was saline gauze. A time out was conducted at 09:40, prior to the start of the procedure. There was no bleeding. The procedure was tolerated well.  Post Debridement Measurements: 14.5cm length x 11.5cm width x 0.1cm depth; 13.097cm^3 volume. Character of Wound/Ulcer Post Debridement is stable. Post procedure Diagnosis Wound #8: Same as Pre-Procedure Plan Follow-up Appointments: Return Appointment in 1 week. Bathing/ Shower/ Hygiene: May shower; gently cleanse wound with antibacterial soap, rinse and pat dry prior to dressing wounds No tub bath. Anesthetic (Use 'Patient Medications' Section for Anesthetic Order Entry): Lidocaine applied to wound bed Edema Control - Lymphedema / Segmental Compressive Device / Other: Tubigrip single layer applied. - bilaterally size D Elevate, Exercise Daily and Avoid Standing for Long Periods of Time. Elevate leg(s) parallel to the floor when sitting. DO YOUR BEST to sleep in the bed at night. DO NOT sleep in your recliner. Long hours of sitting in a recliner leads to swelling of the legs and/or potential wounds on your backside. WOUND #7: - Lower Leg Wound Laterality: Right Cleanser: [employer] Ancillary Kit - 15 Day Supply (DME) (Generic) 3 x Per Week/15 Days Discharge Instructions: Use supplies as instructed; Kit contains: (15) Saline Bullets; (15) 3x3 Gauze; [address] EFTON, WINCHELL F ([id number]) [id number] Page 11 of 14 Cleanser: Soap and Water 3 x Per Week/15 Days Discharge Instructions: Gently cleanse wound with antibacterial soap, rinse and pat dry prior to dressing wounds Cleanser: Wound Cleanser 3 x Per Week/15 Days Discharge Instructions: Wash your hands with soap and water. Remove old dressing, discard into plastic bag and place into trash. Cleanse the wound with Wound Cleanser prior to applying a clean dressing using gauze sponges, not tissues or cotton balls. Do not scrub or use excessive force. Pat dry using gauze sponges, not tissue or cotton balls. Prim Dressing: Hydrofera Blue Ready Transfer Foam, 2.5x2.5 (in/in) (DME) (Generic) 3 x Per Week/15  Days ary Discharge Instructions: Apply Hydrofera Blue Ready to wound bed as directed Secondary Dressing: ABD Pad 5x9 (in/in) (DME) (Generic) 3 x Per Week/15 Days Discharge Instructions: Cover with ABD pad Secondary Dressing: Zetuvit Plus 4x4 (in/in) (Generic) 3 x Per Week/15 Days Secured With: Medipore T - 36M Medipore H Soft Cloth Surgical T ape ape, 2x2 (in/yd) (DME) (Generic) 3 x  Per Week/15 Days Secured With: [employer] Sterile or Non-Sterile 6-ply 4.5x4 (yd/yd) (DME) (Generic) 3 x Per Week/15 Days Discharge Instructions: Apply Kerlix as directed Secured With: Tubigrip Size D, 3x10 (in/yd) 3 x Per Week/15 Days WOUND #8: - Lower Leg Wound Laterality: Left, Circumferential Cleanser: Soap and Water 3 x Per Week/15 Days Discharge Instructions: Gently cleanse wound with antibacterial soap, rinse and pat dry prior to dressing wounds Cleanser: Wound Cleanser 3 x Per Week/15 Days Discharge Instructions: Wash your hands with soap and water. Remove old dressing, discard into plastic bag and place into trash. Cleanse the wound with Wound Cleanser prior to applying a clean dressing using gauze sponges, not tissues or cotton balls. Do not scrub or use excessive force. Pat dry using gauze sponges, not tissue or cotton balls. Prim Dressing: Hydrofera Blue Ready Transfer Foam, 4x5 (in/in) (DME) (Generic) 3 x Per Week/15 Days ary Discharge Instructions: Apply Hydrofera Blue Ready to wound bed as directed Secondary Dressing: ABD Pad 5x9 (in/in) (DME) (Generic) 3 x Per Week/15 Days Discharge Instructions: Cover with ABD pad Secondary Dressing: Zetuvit Plus 4x8 (in/in) (Generic) 3 x Per Week/15 Days Secured With: Medipore T - 40M Medipore H Soft Cloth Surgical T ape ape, 2x2 (in/yd) (DME) (Generic) 3 x Per Week/15 Days Secured With: [employer] Sterile or Non-Sterile 6-ply 4.5x4 (yd/yd) (DME) (Generic) 3 x Per Week/15 Days Discharge Instructions: Apply Kerlix as directed Secured With: Tubigrip Size D,  3x10 (in/yd) 3 x Per Week/15 Days 1. I am going to recommend at this point that we have the patient go ahead and continue to utilize the [facility] which I think is good to be the best way to go and he is in agreement with the plan. 2. I would recommend that we use a Tubigrip size D single-layer to help with some mild compression. 3. With regard to his vascular status he is actually been referred by his primary care provider to vascular and that is at the end of this month. With that being said I do not know who he is seeing at this point as I was not able to review epic in the short interim. Nonetheless it does look like that that is already being taking care of them we will see what they have to say in the meantime I think that this is a good way to start at this point and he is in agreement with the plan. We will see patient back for reevaluation in 1 week here in the clinic. If anything worsens or changes patient will contact our office for additional recommendations. Electronic Signature(s) Signed: 12/05/2022 4:06:08 PM By: [name] PA-C Entered By: [name] on 12/05/2022 13:06:08 -------------------------------------------------------------------------------- ROS/PFSH Details Patient Name: Date of Service: Gwendolyn Fill, Dashan F. 12/05/2022 8:15 A M Medical Record Number: [id number] Patient Account Number: [id number] Date of Birth/Sex: Treating RN: Apr 25, 1960 (62 y.o. [name] Primary Care Provider: [name] Other Clinician: Referring Provider: Treating Provider/Extender: [name] Self, Referral Weeks in Treatment: 0 Information Obtained From Patient Constitutional Symptoms (General Health) Complaints and Symptoms: Positive for: Chills Eyes Complaints and Symptoms: BRICK, SODERBERG ([id number]) [phone].pdf Page 12 of 14 Positive for: Glasses / Contacts Medical History: Positive for: Cataracts - HX Ear/Nose/Mouth/Throat Complaints  and Symptoms: Negative for: Difficult clearing ears; Sinusitis Gastrointestinal Complaints and Symptoms: Negative for: Frequent diarrhea; Nausea; Vomiting Immunological Complaints and Symptoms: Negative for: Hives; Itching Integumentary (Skin) Complaints and Symptoms: Positive for: Wounds; Breakdown; Swelling Psychiatric Complaints and Symptoms: Negative for: Anxiety;  Claustrophobia Hematologic/Lymphatic Medical History: Positive for: Anemia; Lymphedema Respiratory Medical History: Positive for: Asthma; Sleep Apnea Cardiovascular Medical History: Positive for: Arrhythmia - A FIB; Congestive Heart Failure; Coronary Artery Disease; Hypertension Past Medical History Notes: chronic venous insufficiency Endocrine Medical History: Positive for: Type II Diabetes Time with diabetes: 1997 Treated with: Insulin Blood sugar tested every day: Yes Tested : CGM Genitourinary Medical History: Positive for: End Stage Renal Disease Past Medical History Notes: CKD stage 4, renal transplant Musculoskeletal Medical History: Positive for: Gout; Osteoarthritis Neurologic Medical History: Positive for: Neuropathy Oncologic HBO Extended History Items Eyes: Cataracts JURIAH, LANAHAN ([id number]) [id number] Page 13 of 14 Immunizations Pneumococcal Vaccine: Received Pneumococcal Vaccination: Yes Received Pneumococcal Vaccination On or After 60th Birthday: Yes Implantable Devices None Family and Social History Cancer: Yes - Maternal Grandparents,Paternal Grandparents; Diabetes: Yes - Maternal Grandparents; Heart Disease: Yes - Father; Kidney Disease: Yes - Father; Never smoker - cigars in past; Marital Status - Married; Alcohol Use: Never; Drug Use: No History; Caffeine Use: Daily; Financial Concerns: No; Food, Clothing or Shelter Needs: No; Support System Lacking: No; Transportation Concerns: No Electronic Signature(s) Signed: 12/05/2022 3:43:07 PM By:  [name] Signed: 12/05/2022 4:31:22 PM By: [name] PA-C Entered By: [name] on 12/05/2022 05:49:36 -------------------------------------------------------------------------------- SuperBill Details Patient Name: Date of Service: [name] 12/05/2022 Medical Record Number: [id number] Patient Account Number: [id number] Date of Birth/Sex: Treating RN: 05/29/1960 (62 y.o. [name] Primary Care Provider: [name] Other Clinician: Referring Provider: Treating Provider/Extender: [name] Self, Referral Weeks in Treatment: 0 Diagnosis Coding ICD-10 Codes Code Description [phone] Chronic venous hypertension (idiopathic) with ulcer and inflammation of bilateral lower extremity I89.0 Lymphedema, not elsewhere classified E11.622 Type 2 diabetes mellitus with other skin ulcer L97.812 Non-pressure chronic ulcer of other part of right lower leg with fat layer exposed S81.812A Laceration without foreign body, left lower leg, initial encounter L97.822 Non-pressure chronic ulcer of other part of left lower leg with fat layer exposed Z94.0 Kidney transplant status I10 Essential (primary) hypertension I50.42 Chronic combined systolic (congestive) and diastolic (congestive) heart failure I48.0 Paroxysmal atrial fibrillation Z79.01 Long term (current) use of anticoagulants Facility Procedures : CPT4 Code: [id number] Description: 99213 - WOUND CARE VISIT-LEV 3 EST PT Modifier: Quantity: 1 : CPT4 Code: 84696295 Description: 11042 - DEB SUBQ TISSUE 20 SQ CM/< ICD-10 Diagnosis Description L97.812 Non-pressure chronic ulcer of other part of right lower leg with fat layer expo Modifier: sed Quantity: 1 Physician Procedures : CPT4 Code Description Modifier [id number] 99214 - WC PHYS LEVEL 4 - EST PT 25 MUAD, KIRCH ([id number]) [id number] ICD-10 Diagnosis Description I87.333 Chronic venous hypertension (idiopathic) with ulcer and  inflammation of  bilateral lower extremity I89.0 Lymphedema, not elsewhere classified E11.622 Type 2 diabetes mellitus with other skin ulcer L97.812 Non-pressure chronic ulcer of other part of right lower leg with fat layer exposed Quantity: 1 .pdf Page 14 of 14 : [id number] 11042 - WC PHYS SUBQ TISS 20 SQ CM 1 ICD-10 Diagnosis Description L97.812 Non-pressure chronic ulcer of other part of right lower leg with fat layer exposed Quantity: Electronic Signature(s) Signed: 12/05/2022 4:09:05 PM By: [name] PA-C Entered By: [name] on 12/05/2022 13:09:05

## 2022-12-05 NOTE — Progress Notes (Signed)
ROMERE, GARDY F ([id number]) [id number] Page 1 of 11 Visit Report for 12/05/2022 Allergy List Details Patient Name: Date of Service: REAFORD, MIODUSZEWSKI 12/05/2022 8:15 A M Medical Record Number: [id number] Patient Account Number: [id number] Date of Birth/Sex: Treating RN: [birth year] (62 y.o. [name] Primary Care Ajia Chadderdon: [name] Other Clinician: Referring Amaura Authier: Treating Renne Platts/Extender: [name] Self, Referral Weeks in Treatment: 0 Allergies Active Allergies morphine Iodinated Contrast Media iodine ibuprofen Allergy Notes Electronic Signature(s) Signed: 12/05/2022 3:43:07 PM By: [name] Entered By: [name] on 12/05/2022 08:46:21 -------------------------------------------------------------------------------- Arrival Information Details Patient Name: Date of Service: [name] 12/05/2022 8:15 A M Medical Record Number: [id number] Patient Account Number: [id number] Date of Birth/Sex: Treating RN: 01-Jun-1960 (62 y.o. [name] Primary Care Jakyren Fluegge: [name] Other Clinician: Referring Corneisha Alvi: Treating Jaedan Huttner/Extender: [name] Self, Referral Weeks in Treatment: 0 Visit Information Patient Arrived: Ambulatory Arrival Time: 08:38 Accompanied By: spouse Transfer Assistance: None Patient Identification Verified: Yes Secondary Verification Process Completed: Yes Patient Has Alerts: Yes Patient Alerts: Patient on Blood Thinner type 2 diabetic GLEB, SWEARINGER ([id number]) FISTULA LUE History Since Last Visit Added or deleted any medications: No Any new allergies or adverse reactions: No Had a fall or experienced change in activities of daily living that may affect risk of falls: No Hospitalized since last visit: No Has Dressing in Place as Prescribed: Yes Pain Present Now: Yes Electronic Signature(s) Signed: 12/05/2022 12:14:21 PM By: [name] Entered By: [name] on  12/05/2022 12:14:21 -------------------------------------------------------------------------------- Clinic Level of Care Assessment Details Patient Name: Date of Service: THELMA, MUCK 12/05/2022 8:15 A M Medical Record Number: [id number] Patient Account Number: [id number] Date of Birth/Sex: Treating RN: 03-25-1961 (62 y.o. [name] Primary Care Shuntae Herzig: [name] Other Clinician: Referring Rangel Echeverri: Treating Therman Hughlett/Extender: [name] Self, Referral Weeks in Treatment: 0 Clinic Level of Care Assessment Items TOOL 1 Quantity Score []  - 0 Use when EandM and Procedure is performed on INITIAL visit ASSESSMENTS - Nursing Assessment / Reassessment X- 1 20 General Physical Exam (combine w/ comprehensive assessment (listed just below) when performed on new pt. evals) X- 1 25 Comprehensive Assessment (HX, ROS, Risk Assessments, Wounds Hx, etc.) ASSESSMENTS - Wound and Skin Assessment / Reassessment []  - 0 Dermatologic / Skin Assessment (not related to wound area) ASSESSMENTS - Ostomy and/or Continence Assessment and Care []  - 0 Incontinence Assessment and Management []  - 0 Ostomy Care Assessment and Management (repouching, etc.) PROCESS - Coordination of Care X - Simple Patient / Family Education for ongoing care 1 15 []  - 0 Complex (extensive) Patient / Family Education for ongoing care X- 1 10 Staff obtains Chiropractor, Records, T Results / Process Orders est []  - 0 Staff telephones HHA, Nursing Homes / Clarify orders / etc []  - 0 Routine Transfer to another Facility (non-emergent condition) []  - 0 Routine Hospital Admission (non-emergent condition) X- 1 15 New Admissions / Manufacturing engineer / Ordering NPWT Apligraf, etc. , []  - 0 Emergency Hospital Admission (emergent condition) PROCESS - Special Needs []  - 0 Pediatric / Minor Patient Management []  - 0 Isolation Patient Management []  - 0 Hearing / Language / Visual special needs []  -  0 Assessment of Community assistance (transportation, D/C planning, etc.) []  - 0 Additional assistance / Altered mentation []  - 0 Support Surface(s) Assessment (bed, cushion, seat, etc.) INTERVENTIONS - Miscellaneous []  - 0 External ear exam JEREMIA, PANTON F ([id number]) [id number] Page 3 of 11 []  - 0 Patient Transfer (multiple staff /  Hoyer Lift / Similar devices) []  - 0 Simple Staple / Suture removal (25 or less) []  - 0 Complex Staple / Suture removal (26 or more) []  - 0 Hypo/Hyperglycemic Management (do not check if billed separately) []  - 0 Ankle / Brachial Index (ABI) - do not check if billed separately Has the patient been seen at the hospital within the last three years: Yes Total Score: 85 Level Of Care: New/Established - Level 3 Electronic Signature(s) Signed: 12/05/2022 3:43:07 PM By: [name] Entered By: [name] on 12/05/2022 10:25:54 -------------------------------------------------------------------------------- Encounter Discharge Information Details Patient Name: Date of Service: [name] 12/05/2022 8:15 A M Medical Record Number: [id number] Patient Account Number: [id number] Date of Birth/Sex: Treating RN: 15-Oct-1960 (62 y.o. [name] Primary Care Bethan Adamek: [name] Other Clinician: Referring Vivaan Helseth: Treating Donaven Criswell/Extender: [name] Self, Referral Weeks in Treatment: 0 Encounter Discharge Information Items Post Procedure Vitals Discharge Condition: Stable Temperature (F): 97.7 Ambulatory Status: Walker Pulse (bpm): 77 Discharge Destination: Home Respiratory Rate (breaths/min): 18 Transportation: Private Auto Blood Pressure (mmHg): 134/77 Accompanied By: wife Schedule Follow-up Appointment: Yes Clinical Summary of Care: Electronic Signature(s) Signed: 12/05/2022 10:30:13 AM By: [name] Entered By: [name] on 12/05/2022  10:30:13 -------------------------------------------------------------------------------- Lower Extremity Assessment Details Patient Name: Date of Service: COLLEEN, LAMUNYON 12/05/2022 8:15 A M Medical Record Number: [id number] Patient Account Number: [id number] Date of Birth/Sex: Treating RN: Sep 04, 1960 (62 y.o. [name] Primary Care Lawrencia Mauney: [name] Other Clinician: Referring Derionna Salvador: Treating Elex Mainwaring/Extender: [name] Self, Referral Weeks in Treatment[address] CAYCE, HULL F ([id number]) [id number] Page 4 of 11 Edema Assessment Assessed: [Left: No] [Right: No] Edema: [Left: Yes] [Right: Yes] Calf Left: Right: Point of Measurement: 34 cm From Medial Instep 40 cm 39 cm Ankle Left: Right: Point of Measurement: 12 cm From Medial Instep 23.8 cm 23.9 cm Vascular Assessment Pulses: Dorsalis Pedis Palpable: [Left:Yes] [Right:Yes] Posterior Tibial Palpable: [Left:Yes] [Right:Yes] Extremity colors, hair growth, and conditions: Extremity Color: [Left:Hyperpigmented] [Right:Hyperpigmented] Hair Growth on Extremity: [Left:Yes] [Right:Yes] Temperature of Extremity: [Left:Warm < 3 seconds] [Right:Warm < 3 seconds] Toe Nail Assessment Left: Right: Thick: No No Discolored: No No Deformed: No No Improper Length and Hygiene: No No Notes easier to feel pulse in right foot, pt unable to tolerate ABI in office, pt states has a vascular appointment scheduled Electronic Signature(s) Signed: 12/05/2022 3:43:07 PM By: [name] Entered By: [name] on 12/05/2022 09:11:29 -------------------------------------------------------------------------------- Multi Wound Chart Details Patient Name: Date of Service: [name] 12/05/2022 8:15 A M Medical Record Number: [id number] Patient Account Number: [id number] Date of Birth/Sex: Treating RN: 26-Jun-1960 (62 y.o. [name] Primary Care Ksean Vale: [name] Other  Clinician: Referring Keita Demarco: Treating Aundrea Horace/Extender: [name] Self, Referral Weeks in Treatment: 0 Vital Signs Height(in): 70 Pulse(bpm): 77 Weight(lbs): 253 Blood Pressure(mmHg): 134/75 Body Mass Index(BMI): 36.3 Temperature(F): 97.7 Respiratory Rate(breaths/min): [address] [place] F ([id number]) [7:Photos:] [[id number] Page 5 of 11 N/A] Right Lower Leg Left, Circumferential Lower Leg N/A Wound Location: Blister Skin Tear/Laceration N/A Wounding Event: Diabetic Wound/Ulcer of the Lower Skin Tear N/A Primary Etiology: Extremity Cataracts, Anemia, Lymphedema, Cataracts, Anemia, Lymphedema, N/A Comorbid History: Asthma, Sleep Apnea, Arrhythmia, Asthma, Sleep Apnea, Arrhythmia, Congestive Heart Failure, Coronary Congestive Heart Failure, Coronary Artery Disease, Hypertension, Type IIArtery Disease, Hypertension, Type II Diabetes, End Stage Renal Disease, Diabetes, End Stage Renal Disease, Gout, Osteoarthritis, Neuropathy Gout, Osteoarthritis, Neuropathy 11/21/2022 11/30/2022 N/A Date Acquired: 0 0 N/A Weeks of Treatment: Open Open N/A Wound Status: No No N/A Wound  Recurrence: 1.6x4x0.1 14.5x11.5x0.1 N/A Measurements L x W x D (cm) 5.027 130.965 N/A A (cm) : rea 0.503 13.097 N/A Volume (cm) : Grade 2 Full Thickness Without Exposed N/A Classification: Support Structures Medium Medium N/A Exudate A mount: Serosanguineous Serosanguineous N/A Exudate Type: red, brown red, brown N/A Exudate Color: Small (1-33%) Medium (34-66%) N/A Granulation A mount: Red Red, Pink N/A Granulation Quality: Large (67-100%) Medium (34-66%) N/A Necrotic A mount: Fat Layer (Subcutaneous Tissue): Yes Fat Layer (Subcutaneous Tissue): Yes N/A Exposed Structures: None None N/A Epithelialization: Treatment Notes Electronic Signature(s) Signed: 12/05/2022 3:43:07 PM By: [name] Entered By: [name] on 12/05/2022  09:36:15 -------------------------------------------------------------------------------- Multi-Disciplinary Care Plan Details Patient Name: Date of Service: [name] 12/05/2022 8:15 A M Medical Record Number: [id number] Patient Account Number: [id number] Date of Birth/Sex: Treating RN: 1960/12/01 (62 y.o. [name] Primary Care Janequa Kipnis: [name] Other Clinician: Referring Lucion Dilger: Treating Atlee Kluth/Extender: [name] Self, Referral Weeks in Treatment: 0 Active Inactive Necrotic Tissue Nursing Diagnoses: Impaired tissue integrity related to necrotic/devitalized tissue Knowledge deficit related to management of necrotic/devitalized tissue Goals: ALMA, POLINO ([id number]) [id number] Page 6 of 11 Necrotic/devitalized tissue will be minimized in the wound bed Date Initiated: 12/05/2022 Target Resolution Date: 01/30/2023 Goal Status: Active Patient/caregiver will verbalize understanding of reason and process for debridement of necrotic tissue Date Initiated: 12/05/2022 Date Inactivated: 12/05/2022 Target Resolution Date: 12/05/2022 Goal Status: Met Interventions: Assess patient pain level pre-, during and post procedure and prior to discharge Provide education on necrotic tissue and debridement process Treatment Activities: Apply topical anesthetic as ordered : 12/05/2022 Notes: Pain, Acute or Chronic Nursing Diagnoses: Pain, acute or chronic: actual or potential Goals: Patient will verbalize adequate pain control and receive pain control interventions during procedures as needed Date Initiated: 12/05/2022 Target Resolution Date: 01/02/2023 Goal Status: Active Patient/caregiver will verbalize adequate pain control between visits Date Initiated: 12/05/2022 Target Resolution Date: 01/02/2023 Goal Status: Active Patient/caregiver will verbalize comfort level met Date Initiated: 12/05/2022 Target Resolution Date: 01/02/2023 Goal  Status: Active Interventions: Complete pain assessment as per visit requirements Provide education on pain management Provision of support: recognize patient pain, provide comfort and support as needed Reposition patient for comfort Notes: Wound/Skin Impairment Nursing Diagnoses: Impaired tissue integrity Knowledge deficit related to ulceration/compromised skin integrity Goals: Ulcer/skin breakdown will have a volume reduction of 30% by week 4 Date Initiated: 12/05/2022 Target Resolution Date: 01/02/2023 Goal Status: Active Ulcer/skin breakdown will have a volume reduction of 50% by week 8 Date Initiated: 12/05/2022 Target Resolution Date: 01/30/2023 Goal Status: Active Ulcer/skin breakdown will have a volume reduction of 80% by week 12 Date Initiated: 12/05/2022 Target Resolution Date: 02/27/2023 Goal Status: Active Ulcer/skin breakdown will heal within 14 weeks Date Initiated: 12/05/2022 Target Resolution Date: 03/13/2023 Goal Status: Active Interventions: Assess patient/caregiver ability to obtain necessary supplies Assess patient/caregiver ability to perform ulcer/skin care regimen upon admission and as needed Assess ulceration(s) every visit Provide education on ulcer and skin care Treatment Activities: Topical wound management initiated : 12/05/2022 Notes: Electronic Signature(s) Signed: 12/05/2022 10:29:02 AM By: [name] Previous Signature: 12/05/2022 10:27:58 AM Version By: [name] Entered By: [name] on 12/05/2022 10:29:02 Isabella Bowens ([id number]) [id number] Page 7 of 11 -------------------------------------------------------------------------------- Pain Assessment Details Patient Name: Date of Service: DAMARIUS, MATHEW 12/05/2022 8:15 A M Medical Record Number: [id number] Patient Account Number: [id number] Date of Birth/Sex: Treating RN: 1960-11-28 (62 y.o. [name] Primary Care Lailah Marcelli: [name]  Other Clinician: Referring Abdoul Encinas: Treating Autum Benfer/Extender: Larina Bras,  Hoyt Self, Referral Weeks in Treatment: 0 Active Problems Location of Pain Severity and Description of Pain Patient Has Paino Yes Site Locations Rate the pain. Current Pain Level: 7 Pain Management and Medication Current Pain Management: Notes patient states pain in bilat LE Electronic Signature(s) Signed: 12/05/2022 3:43:07 PM By: [name] Entered By: [name] on 12/05/2022 08:43:04 -------------------------------------------------------------------------------- Patient/Caregiver Education Details Patient Name: Date of Service: Donzetta Starch 9/12/2024andnbsp8:15 A M Medical Record Number: [id number] Patient Account Number: [id number] Date of Birth/Gender: Treating RN: Nov 22, 1960 (62 y.o. Cobe, Samaniego, Roxton F ([id number]) [id number] Page 8 of 11 Primary Care Physician: [name] Other Clinician: Referring Physician: Treating Physician/Extender: [name] Self, Referral Weeks in Treatment: 0 Education Assessment Education Provided To: Patient Education Topics Provided Welcome [facility] Packet: o Handouts: [facility] P[facility] form, Welcome T [facility] o Methods: Explain/Verbal Responses: State content correctly Wound Debridement: Handouts: Wound Debridement Methods: Explain/Verbal Responses: State content correctly Wound/Skin Impairment: Handouts: Caring for Your Ulcer Methods: Explain/Verbal Responses: State content correctly Electronic Signature(s) Signed: 12/05/2022 3:43:07 PM By: [name] Entered By: [name] on 12/05/2022 10:29:08 -------------------------------------------------------------------------------- Wound Assessment Details Patient Name: Date of Service: [name] 12/05/2022 8:15 A M Medical Record Number: [id number] Patient Account Number:  [id number] Date of Birth/Sex: Treating RN: Dec 09, 1960 (62 y.o. [name] Primary Care Arvle Grabe: [name] Other Clinician: Referring Syenna Nazir: Treating Natasia Sanko/Extender: [name] Self, Referral Weeks in Treatment: 0 Wound Status Wound Number: 7 Primary Diabetic Wound/Ulcer of the Lower Extremity Etiology: Wound Location: Right Lower Leg Wound Open Wounding Event: Blister Status: Date Acquired: 11/21/2022 Comorbid Cataracts, Anemia, Lymphedema, Asthma, Sleep Apnea, Weeks Of Treatment: 0 History: Arrhythmia, Congestive Heart Failure, Coronary Artery Disease, Clustered Wound: No Hypertension, Type II Diabetes, End Stage Renal Disease, Gout, Osteoarthritis, Neuropathy Photos JOVANN, LIVERS F ([id number]) [id number] Page 9 of 11 Wound Measurements Length: (cm) 1.6 Width: (cm) 4 Depth: (cm) 0.1 Area: (cm) 5.027 Volume: (cm) 0.503 % Reduction in Area: % Reduction in Volume: Epithelialization: None Tunneling: No Undermining: No Wound Description Classification: Grade 2 Exudate Amount: Medium Exudate Type: Serosanguineous Exudate Color: red, brown Foul Odor After Cleansing: No Slough/Fibrino Yes Wound Bed Granulation Amount: Small (1-33%) Exposed Structure Granulation Quality: Red Fat Layer (Subcutaneous Tissue) Exposed: Yes Necrotic Amount: Large (67-100%) Necrotic Quality: Adherent Slough Treatment Notes Wound #7 (Lower Leg) Wound Laterality: Right Cleanser Byram Ancillary Kit - 15 Day Supply Discharge Instruction: Use supplies as instructed; Kit contains: (15) Saline Bullets; (15) 3x3 Gauze; 15 pr Gloves Soap and Water Discharge Instruction: Gently cleanse wound with antibacterial soap, rinse and pat dry prior to dressing wounds Wound Cleanser Discharge Instruction: Wash your hands with soap and water. Remove old dressing, discard into plastic bag and place into trash. Cleanse the wound with Wound Cleanser prior to applying a  clean dressing using gauze sponges, not tissues or cotton balls. Do not scrub or use excessive force. Pat dry using gauze sponges, not tissue or cotton balls. Peri-Wound Care Topical Primary Dressing Hydrofera Blue Ready Transfer Foam, 2.5x2.5 (in/in) Discharge Instruction: Apply Hydrofera Blue Ready to wound bed as directed Secondary Dressing ABD Pad 5x9 (in/in) Discharge Instruction: Cover with ABD pad Zetuvit Plus 4x4 (in/in) Secured With Medipore T - 42M Medipore H Soft Cloth Surgical T ape ape, 2x2 (in/yd) Kerlix Roll Sterile or Non-Sterile 6-ply 4.5x4 (yd/yd) Discharge Instruction: Apply Kerlix as directed Tubigrip Size D, 3x10 (in/yd) Compression Wrap Compression Stockings Add-Ons Electronic Signature(s) Signed: 12/05/2022  3:43:07 PM By: [name] Entered By: [name] on 12/05/2022 09:13:06 Isabella Bowens ([id number]) [id number] Page 10 of 11 -------------------------------------------------------------------------------- Wound Assessment Details Patient Name: Date of Service: MUHAMMAD, SAUNDERS 12/05/2022 8:15 A M Medical Record Number: [id number] Patient Account Number: [id number] Date of Birth/Sex: Treating RN: [birth year] (62 y.o. [name] Primary Care Takeem Krotzer: [name] Other Clinician: Referring Dania Marsan: Treating Schae Cando/Extender: [name] Self, Referral Weeks in Treatment: 0 Wound Status Wound Number: 8 Primary Skin T ear Etiology: Wound Location: Left, Circumferential Lower Leg Wound Open Wounding Event: Skin Tear/Laceration Status: Date Acquired: 11/30/2022 Comorbid Cataracts, Anemia, Lymphedema, Asthma, Sleep Apnea, Weeks Of Treatment: 0 History: Arrhythmia, Congestive Heart Failure, Coronary Artery Disease, Clustered Wound: No Hypertension, Type II Diabetes, End Stage Renal Disease, Gout, Osteoarthritis, Neuropathy Photos Wound Measurements Length: (cm) 14.5 Width: (cm) 11.5 Depth: (cm)  0.1 Area: (cm) 130.965 Volume: (cm) 13.097 % Reduction in Area: % Reduction in Volume: Epithelialization: None Tunneling: No Undermining: No Wound Description Classification: Full Thickness Without Exposed Support Structures Exudate Amount: Medium Exudate Type: Serosanguineous Exudate Color: red, brown Foul Odor After Cleansing: No Slough/Fibrino Yes Wound Bed Granulation Amount: Medium (34-66%) Exposed Structure Granulation Quality: Red, Pink Fat Layer (Subcutaneous Tissue) Exposed: Yes Necrotic Amount: Medium (34-66%) Necrotic Quality: Adherent Slough Treatment Notes Wound #8 (Lower Leg) Wound Laterality: Left, Circumferential Cleanser Soap and Water Discharge Instruction: Gently cleanse wound with antibacterial soap, rinse and pat dry prior to dressing wounds Wound Cleanser Discharge Instruction: Wash your hands with soap and water. Remove old dressing, discard into plastic bag and place into trash. Cleanse the FLOR, RICARTE ([id number]) [id number] Page 11 of 11 wound with Wound Cleanser prior to applying a clean dressing using gauze sponges, not tissues or cotton balls. Do not scrub or use excessive force. Pat dry using gauze sponges, not tissue or cotton balls. Peri-Wound Care Topical Primary Dressing Hydrofera Blue Ready Transfer Foam, 4x5 (in/in) Discharge Instruction: Apply Hydrofera Blue Ready to wound bed as directed Secondary Dressing ABD Pad 5x9 (in/in) Discharge Instruction: Cover with ABD pad Zetuvit Plus 4x8 (in/in) Secured With Medipore T - 45M Medipore H Soft Cloth Surgical T ape ape, 2x2 (in/yd) Kerlix Roll Sterile or Non-Sterile 6-ply 4.5x4 (yd/yd) Discharge Instruction: Apply Kerlix as directed Tubigrip Size D, 3x10 (in/yd) Compression Wrap Compression Stockings Add-Ons Electronic Signature(s) Signed: 12/05/2022 3:43:07 PM By: [name] Entered By: [name] on 12/05/2022  09:14:15 -------------------------------------------------------------------------------- Vitals Details Patient Name: Date of Service: [name] 12/05/2022 8:15 A M Medical Record Number: [id number] Patient Account Number: [id number] Date of Birth/Sex: Treating RN: 1960-10-25 (62 y.o. [name] Primary Care Kendahl Bumgardner: [name] Other Clinician: Referring Raja Liska: Treating Curt Oatis/Extender: [name] Self, Referral Weeks in Treatment: 0 Vital Signs Time Taken: 08:43 Temperature (F): 97.7 Height (in): 70 Pulse (bpm): 77 Source: Stated Respiratory Rate (breaths/min): 18 Weight (lbs): 253 Blood Pressure (mmHg): 134/75 Source: Stated Reference Range: 80 - 120 mg / dl Body Mass Index (BMI): 36.3 Electronic Signature(s) Signed: 12/05/2022 3:43:07 PM By: [name] Entered By: [name] on 12/05/2022 08:45:37

## 2022-12-05 NOTE — Progress Notes (Signed)
[name] [name] ([id number]) [phone] [id number] Page 1 of 5 Visit Report for 12/05/2022 Abuse Risk Screen Details Patient Name: Date of Service: [name], [name] 12/05/2022 8:15 A M Medical Record Number: [id number] Patient Account Number: [id number] Date of Birth/Sex: Treating RN: 06-25-1960 (62 y.o. [name] Primary Care [name]: [name] Other Clinician: Referring [name]: Treating [name]/Extender: [name] Self, Referral Weeks in Treatment: 0 Abuse Risk Screen Items Answer ABUSE RISK SCREEN: Has anyone close to you tried to hurt or harm you recentlyo No Do you feel uncomfortable with anyone in your familyo No Has anyone forced you do things that you didnt want to doo No Electronic Signature(s) Signed: 12/05/2022 3:43:07 PM By: [name] Entered By: [name] on 12/05/2022 05:49:44 -------------------------------------------------------------------------------- Activities of Daily Living Details Patient Name: Date of Service: [name], [name] 12/05/2022 8:15 A M Medical Record Number: [id number] Patient Account Number: [id number] Date of Birth/Sex: Treating RN: [birth year] (62 y.o. [name] Primary Care [name]: [name] Other Clinician: Referring [name]: Treating [name]/Extender: [name] Self, Referral Weeks in Treatment: 0 Activities of Daily Living Items Answer Activities of Daily Living (Please select one for each item) Drive Automobile Completely Able T Medications ake Completely Able Use T elephone Completely Able Care for Appearance Completely Able Use T oilet Completely Able Bath / Shower Completely Able Dress Self Completely Able Feed Self Completely Able Walk Completely Able Get In / Out Bed Completely Able Housework Completely Able Carl, [name] ([id number]) [phone] [id number] Page 2 of 5 Prepare Meals Completely Able Handle Money Completely  Able Shop for Self Completely Able Electronic Signature(s) Signed: 12/05/2022 3:43:07 PM By: [name] Entered By: [name] on 12/05/2022 05:50:08 -------------------------------------------------------------------------------- Education Screening Details Patient Name: Date of Service: [name]. 12/05/2022 8:15 A M Medical Record Number: [id number] Patient Account Number: [id number] Date of Birth/Sex: Treating RN: 1960/08/24 (62 y.o. [name] Primary Care [name]: [name] Other Clinician: Referring [name]: Treating [name]/Extender: [name] Self, Referral Weeks in Treatment: 0 Primary Learner Assessed: Patient Learning Preferences/Education Level/Primary Language Learning Preference: Explanation, Demonstration, Video, Communication Board, Printed Material Preferred Language: English Cognitive Barrier Language Barrier: No Translator Needed: No Memory Deficit: No Emotional Barrier: No Cultural/Religious Beliefs Affecting Medical Care: No Physical Barrier Impaired Vision: Yes Glasses Impaired Hearing: No Decreased Hand dexterity: No Knowledge/Comprehension Knowledge Level: High Comprehension Level: High Ability to understand written instructions: High Ability to understand verbal instructions: High Motivation Anxiety Level: Calm Cooperation: Cooperative Education Importance: Acknowledges Need Interest in Health Problems: Asks Questions Perception: Coherent Willingness to Engage in Self-Management High Activities: Readiness to Engage in Self-Management High Activities: Electronic Signature(s) Signed: 12/05/2022 3:43:07 PM By: [name] Entered By: [name] on 12/05/2022 05:50:29 [name] ([id number]) [id number] Page 3 of 5 -------------------------------------------------------------------------------- Fall Risk Assessment Details Patient Name: Date of Service: [name], [name] 12/05/2022 8:15 A M Medical Record Number: [id number] Patient Account Number: [id number] Date of Birth/Sex: Treating RN: 02-Mar-1961 (62 y.o. [name] Primary Care [name]: [name] Other Clinician: Referring [name]: Treating [name]/Extender: [name] Self, Referral Weeks in Treatment: 0 Fall Risk Assessment Items Have you had 2 or more falls in the last 12 monthso 0 No Have you had any fall that resulted in injury in the last 12 monthso 0 No FALLS RISK SCREEN History of falling - immediate or within 3 months 0 No Secondary diagnosis (Do you have 2 or more medical diagnoseso) 0 No Ambulatory aid None/bed rest/wheelchair/nurse 0 Yes  Crutches/cane/walker 0 No Furniture 0 No Intravenous therapy Access/Saline/Heparin Lock 0 No Gait/Transferring Normal/ bed rest/ wheelchair 0 Yes Weak (short steps with or without shuffle, stooped but able to lift head while walking, may seek 0 No support from furniture) Impaired (short steps with shuffle, may have difficulty arising from chair, head down, impaired 0 No balance) Mental Status Oriented to own ability 0 Yes Electronic Signature(s) Signed: 12/05/2022 3:43:07 PM By: [name] Entered By: [name] on 12/05/2022 05:50:36 -------------------------------------------------------------------------------- Foot Assessment Details Patient Name: Date of Service: [name]. 12/05/2022 8:15 A M Medical Record Number: [id number] Patient Account Number: [id number] Date of Birth/Sex: Treating RN: October 02, 1960 (62 y.o. [name] Primary Care [name]: [name] Other Clinician: Referring [name]: Treating [name]/Extender: [name] Self, Referral Weeks in Treatment: 0 Foot Assessment Items Site Locations [name], [name] ([id number]) [phone] [id number] Page 4 of 5 + = Sensation present, - = Sensation absent, C = Callus, U = Ulcer R = Redness, W = Warmth, M =  Maceration, PU = Pre-ulcerative lesion [name] = Fissure, S = Swelling, D = Dryness Assessment Right: Left: Other Deformity: No No Prior Foot Ulcer: No No Prior Amputation: No No Charcot Joint: No No Ambulatory Status: Ambulatory Without Help Gait: Steady Electronic Signature(s) Signed: 12/05/2022 3:43:07 PM By: [name] Entered By: [name] on 12/05/2022 05:56:08 -------------------------------------------------------------------------------- Nutrition Risk Screening Details Patient Name: Date of Service: [name], [name] 12/05/2022 8:15 A M Medical Record Number: [id number] Patient Account Number: [id number] Date of Birth/Sex: Treating RN: 11-Aug-1960 (62 y.o. [name] Primary Care Yukiko Minnich: [name] Other Clinician: Referring Jase Reep: Treating Mandee Pluta/Extender: [name] Self, Referral Weeks in Treatment: 0 Height (in): 70 Weight (lbs): 253 Body Mass Index (BMI): 36.3 Nutrition Risk Screening Items Score Screening NUTRITION RISK SCREEN: I have an illness or condition that made me change the kind and/or amount of food I eat 0 No I eat fewer than two meals per day 0 No I eat few fruits and vegetables, or milk products 0 No I have three or more drinks of beer, liquor or wine almost every day 0 No I have tooth or mouth problems that make it hard for me to eat 0 No I don't always have enough money to buy the food I need 0 No LORRAINE, RATHGEB [name] ([id number]) [phone] [id number] Page 5 of 5 I eat alone most of the time 0 No I take three or more different prescribed or over-the-counter drugs a day 0 No Without wanting to, I have lost or gained 10 pounds in the last six months 0 No I am not always physically able to shop, cook and/or feed myself 0 No Nutrition Protocols Good Risk Protocol 0 No interventions needed Moderate Risk Protocol High Risk Proctocol Risk Level: Good Risk Score: 0 Electronic Signature(s) Signed: 12/05/2022  3:43:07 PM By: [name] Entered By: [name] on 12/05/2022 05:50:41

## 2022-12-06 ENCOUNTER — Telehealth: Payer: Commercial Managed Care - PPO | Admitting: Physician Assistant

## 2022-12-06 ENCOUNTER — Other Ambulatory Visit: Payer: Self-pay

## 2022-12-06 ENCOUNTER — Telehealth: Payer: Self-pay | Admitting: Internal Medicine

## 2022-12-06 DIAGNOSIS — L97812 Non-pressure chronic ulcer of other part of right lower leg with fat layer exposed: Secondary | ICD-10-CM | POA: Diagnosis not present

## 2022-12-06 DIAGNOSIS — I10 Essential (primary) hypertension: Secondary | ICD-10-CM | POA: Diagnosis not present

## 2022-12-06 DIAGNOSIS — I480 Paroxysmal atrial fibrillation: Secondary | ICD-10-CM | POA: Diagnosis not present

## 2022-12-06 DIAGNOSIS — M79604 Pain in right leg: Secondary | ICD-10-CM

## 2022-12-06 DIAGNOSIS — I87333 Chronic venous hypertension (idiopathic) with ulcer and inflammation of bilateral lower extremity: Secondary | ICD-10-CM | POA: Diagnosis not present

## 2022-12-06 DIAGNOSIS — L97822 Non-pressure chronic ulcer of other part of left lower leg with fat layer exposed: Secondary | ICD-10-CM | POA: Diagnosis not present

## 2022-12-06 DIAGNOSIS — S81809A Unspecified open wound, unspecified lower leg, initial encounter: Secondary | ICD-10-CM | POA: Diagnosis not present

## 2022-12-06 DIAGNOSIS — S81812A Laceration without foreign body, left lower leg, initial encounter: Secondary | ICD-10-CM | POA: Diagnosis not present

## 2022-12-06 DIAGNOSIS — I890 Lymphedema, not elsewhere classified: Secondary | ICD-10-CM | POA: Diagnosis not present

## 2022-12-06 DIAGNOSIS — E11622 Type 2 diabetes mellitus with other skin ulcer: Secondary | ICD-10-CM | POA: Diagnosis not present

## 2022-12-06 DIAGNOSIS — I5042 Chronic combined systolic (congestive) and diastolic (congestive) heart failure: Secondary | ICD-10-CM | POA: Diagnosis not present

## 2022-12-06 MED ORDER — HYDROCODONE-ACETAMINOPHEN 5-325 MG PO TABS
1.0000 | ORAL_TABLET | Freq: Four times a day (QID) | ORAL | 0 refills | Status: DC | PRN
  Filled 2022-12-06: qty 40, 5d supply, fill #0

## 2022-12-06 NOTE — Addendum Note (Signed)
Addended by: Eual Fines on: 12/06/2022 01:10 PM   Modules accepted: Orders

## 2022-12-06 NOTE — Telephone Encounter (Signed)
error 

## 2022-12-06 NOTE — Patient Instructions (Signed)
Dawn Woodward Ku., thank you for joining Margaretann Loveless, PA-C for today's virtual visit.  While this provider is not your primary care provider (PCP), if your PCP is located in our provider database this encounter information will be shared with them immediately following your visit.   A [facility] MyChart account gives you access to today's visit and all your visits, tests, and labs performed at [facility] " click here if you don't have a [facility] MyChart account or go to [email or web address]  Consent: (Patient) [name]. provided verbal consent for this virtual visit at the beginning of the encounter.  Current Medications:  Current Outpatient Medications:    albuterol (PROVENTIL) (2.5 MG/3ML) 0.083% nebulizer solution, Take 3 mLs (2.5 mg total) by nebulization every 6 (six) hours as needed for wheezing or shortness of breath., Disp: 150 mL, Rfl: 0   albuterol (VENTOLIN HFA) 108 (90 Base) MCG/ACT inhaler, Inhale 2 puffs into the lungs every 6 (six) hours as needed for wheezing or shortness of breath., Disp: 6.7 g, Rfl: 5   amoxicillin-clavulanate (AUGMENTIN) 875-125 MG tablet, Take 1 tablet by mouth 2 (two) times daily., Disp: 14 tablet, Rfl: 0   apixaban (ELIQUIS) 5 MG TABS tablet, Take 1 tablet (5 mg total) by mouth 2 (two) times daily., Disp: 180 tablet, Rfl: 3   buPROPion (WELLBUTRIN SR) 150 MG 12 hr tablet, Take 1 tablet (150 mg total) by mouth 2 (two) times daily., Disp: 180 tablet, Rfl: 3   cholecalciferol (VITAMIN D3) 25 MCG (1000 UNIT) tablet, Take 1,000 Units by mouth at bedtime., Disp: , Rfl:    colchicine 0.6 MG tablet, Take 1 tablet (0.6 mg total) by mouth every other day., Disp: 45 tablet, Rfl: 1   Continuous Glucose Sensor (FREESTYLE LIBRE 3 SENSOR) MISC, Use one every 2 weeks., Disp: 6 each, Rfl: 3   cyclobenzaprine (FLEXERIL) 5 MG tablet, Take by mouth., Disp: , Rfl:    dapagliflozin propanediol (FARXIGA) 10 MG TABS tablet, Take 1  tablet (10 mg total) by mouth daily before breakfast., Disp: 90 tablet, Rfl: 3   febuxostat (ULORIC) 40 MG tablet, Take 3 tablets (120 mg total) by mouth daily., Disp: 90 tablet, Rfl: 5   fluticasone (FLONASE) 50 MCG/ACT nasal spray, Place 2 sprays into both nostrils daily., Disp: 16 g, Rfl: 11   gabapentin (NEURONTIN) 300 MG capsule, Take 1 capsule (300 mg total) by mouth 2 (two) times daily., Disp: 180 capsule, Rfl: 3   hydrALAZINE (APRESOLINE) 25 MG tablet, Take 1 tablet (25 mg total) by mouth every 8 (eight) hours., Disp: 270 tablet, Rfl: 3   HYDROcodone-acetaminophen (NORCO/VICODIN) 5-325 MG tablet, Take 1-2 tablets by mouth every 6 (six) hours as needed for moderate pain., Disp: 40 tablet, Rfl: 0   hydrocortisone 2.5 % cream, Apply topically 3 (three) times daily as needed., Disp: 30 g, Rfl: 3   insulin glargine-yfgn (SEMGLEE) 100 UNIT/ML Pen, Inject 40 Units into the skin daily., Disp: 45 mL, Rfl: 3   insulin lispro (HUMALOG KWIKPEN) 100 UNIT/ML KwikPen, Inject 10-20 Units into the skin 3 (three) times daily. Per sliding scale, Disp: , Rfl:    Insulin Pen Needle (TECHLITE PEN NEEDLES) 31G X 5 MM MISC, 4 (four) times daily., Disp: 400 each, Rfl: 3   Insulin Pen Needle (TECHLITE PEN NEEDLES) 32G X 6 MM MISC, Use 4 times a day, Disp: 100 each, Rfl: 3   Insulin Pen Needle (UNIFINE PENTIPS) 31G X 5 MM MISC, Use 4 times daily  as directed, Disp: 400 each, Rfl: 3   iron polysaccharides (FERREX 150) 150 MG capsule, Take 1 capsule (150 mg total) by mouth 2 (two) times daily., Disp: 180 capsule, Rfl: 0   isosorbide mononitrate (IMDUR) 60 MG 24 hr tablet, Take 0.5 tablets (30 mg total) by mouth daily., Disp: 30 tablet, Rfl: 6   lidocaine (LIDODERM) 5 %, Place 1 patch onto the skin daily.PLACE 1 PATCH ONTO THE SKIN FOR 12 (TWELVE) HOURS. REMOVE & DISCARD PATCH WITHIN 12 HOURS OR AS DIRECTED BY MD (Patient taking differently: Place 1 patch onto the skin daily as needed.), Disp: 60 patch, Rfl: 11   lidocaine  (XYLOCAINE) 5 % ointment, Apply 1 Application topically 3 (three) times daily as needed. Apply pea size amount to area as needed for pain., Disp: 50 g, Rfl: 0   losartan (COZAAR) 25 MG tablet, Take 1 tablet (25 mg total) by mouth daily., Disp: 90 tablet, Rfl: 3   metolazone (ZAROXOLYN) 2.5 MG tablet, Take 1 tablet (2.5 mg total) by mouth 2 (two) times a week., Disp: , Rfl:    montelukast (SINGULAIR) 10 MG tablet, TAKE 1 TABLET BY MOUTH AT BEDTIME. (Patient taking differently: Take 10 mg by mouth at bedtime.), Disp: 90 tablet, Rfl: 3   Multiple Vitamin (MULTIVITAMIN WITH MINERALS) TABS tablet, Take 1 tablet by mouth daily., Disp: , Rfl:    mycophenolate (MYFORTIC) 180 MG EC tablet, Take 2 tablets (360 mg total) by mouth 2 (two) times daily., Disp: 360 tablet, Rfl: 3   omeprazole (PRILOSEC) 20 MG capsule, TAKE 1 CAPSULE BY MOUTH DAILY. (Patient taking differently: Take 20 mg by mouth at bedtime.), Disp: 90 capsule, Rfl: 3   potassium chloride (KLOR-CON) 10 MEQ tablet, Take 6 tablets (60 mEq total) by mouth every morning AND 4 tablets (40 mEq total) every evening., Disp: 300 tablet, Rfl: 6   predniSONE (DELTASONE) 5 MG tablet, Take 1 tablet (5 mg total) by mouth daily., Disp: 90 tablet, Rfl: 3   rosuvastatin (CRESTOR) 10 MG tablet, Take 1 tablet (10 mg total) by mouth daily., Disp: 90 tablet, Rfl: 3   spironolactone (ALDACTONE) 25 MG tablet, Take 1 tablet (25 mg total) by mouth daily., Disp: 90 tablet, Rfl: 3   sulfamethoxazole-trimethoprim (BACTRIM) 400-80 MG tablet, Take 1 tablet by mouth 3 (three) times a week every Monday, Wednesday, and Friday., Disp: 36 tablet, Rfl: 3   tacrolimus (PROGRAF) 1 MG capsule, Take 2 capsules (2 mg total) by mouth 2 times daily., Disp: 120 capsule, Rfl: 11   tamsulosin (FLOMAX) 0.4 MG CAPS capsule, Take 2 capsules (0.8 mg total) by mouth daily., Disp: 60 capsule, Rfl: 0   Testosterone 20.25 MG/ACT (1.62%) GEL, Place 2 Pump onto the skin daily., Disp: 150 g, Rfl: 3    torsemide (DEMADEX) 100 MG tablet, Take 1 tablet (100 mg total) by mouth 2 (two) times daily., Disp: 60 tablet, Rfl: 5   zinc sulfate 220 (50 Zn) MG capsule, Take 220 mg by mouth daily., Disp: , Rfl:    Medications ordered in this encounter:  No orders of the defined types were placed in this encounter.    *If you need refills on other medications prior to your next appointment, please contact your pharmacy*  Follow-Up: Call back or seek an in-person evaluation if the symptoms worsen or if the condition fails to improve as anticipated.  [facility] [phone]   If you have been instructed to have an in-person evaluation today at a local Urgent  Care facility, please use the link below. It will take you to a list of all of our available [facility], including address, phone number and hours of operation. Please do not delay care.  [facility]  If you or a family member do not have a primary care provider, use the link below to schedule a visit and establish care. When you choose a [facility] primary care physician or advanced practice provider, you gain a long-term partner in health. Find a Primary Care Provider  Learn more about [facility]'s in-office and virtual care options: [facility] - Get Care Now

## 2022-12-06 NOTE — Telephone Encounter (Signed)
Carl Muir RN with access nurse said pt is extreme pain in both legs due to wound care cleansing several places on legs on 12/05/22 and wound care advised pt to get pain med from PCP. Pt was advised to go to ED per access disposition. Pt declined. I spoke with pt and he said he hurts so badly both legs he cannot stand up and bear weight on legs. Pt said he is not going to ED and for me not to worry about it and he will just hurt.. I advised pt per note below access note that Dr Alphonsus Sias has already sent in hydrocodone apap 5 - 325 mg to [facility] community pharmacy. Pt voiced understanding. Sent FYI to PCP Dr Alphonsus Sias who is out of office and Audria Nine NP who is in office .

## 2022-12-06 NOTE — Telephone Encounter (Signed)
Patient called in to follow up on this.

## 2022-12-06 NOTE — Telephone Encounter (Signed)
Pt called requesting a call from [facility] to discuss his wound pain. Pt states he contacted the wound center but was told they cannot prescribed any meds for him & to contact Dr. Alphonsus Sias. Told pt if he wants a rx, he'll need an appt. Pt stated he cannot walk to come in for an appt. Could appt possibly be virtual? Call back # [phone]

## 2022-12-06 NOTE — Progress Notes (Signed)
Patient seen at wound clinic yesterday and having severe pain in lower extremities from the wound cleansing. Pain is significant enough he reports being unable to bear weight to stand. Wound clinic would not supply pain medications. Advised that through the virtual urgent care, we are unable to supply any controlled substances and patient cannot take NSAIDs due to kidney transplant status. Will mark visit no charge and advised to follow up with PCP or be seen in person at a local Urgent Care facility for pain management.

## 2022-12-06 NOTE — Addendum Note (Signed)
Addended by: [name] I on: 12/06/2022 01:32 PM   Modules accepted: Orders

## 2022-12-06 NOTE — Telephone Encounter (Signed)
Refill done.  

## 2022-12-06 NOTE — Telephone Encounter (Signed)
Noted. Looks like as mentioned that Dr. Alphonsus Sias has sent in some hydrocodone for pain

## 2022-12-06 NOTE — Telephone Encounter (Signed)
Spoke to pt. Asking for hydrocodone. He had a lot of wound cleaning yesterday and is in a lot of pain.

## 2022-12-08 NOTE — Progress Notes (Deleted)
HPI M followed for OSA, complicated by CAD/ CABG4, CHF, PAH, AFib/ Eliquis, Chronic Venous Insufficiency, Superficial Thrombophlebitis, OSA, OHS, Asthma moderate, Covid infection, DM2, Gout,  ESRD/ transplant, Depression, Obesity/ Bariatric Surgery, Rheumatoid Arthritis, NPSG [place] 03/15/12- AHI 57.9/ hr, desaturation to 78.8%, body weight 310 lbs Overnight oximetry on BIPAP / Room Air 11/29/20- </= 88% for 2 hrs 58 minutes =============================================================   12/06/21-  61 yoM followed for OSA, complicated by CAD/ CABG4, CHF, PAH, AFib/ Eliquis, Chronic Venous Insufficiency, Superficial Thrombophlebitis, OSA, OHS, Asthma moderate, Covid infection, DM2, Gout,  ESRD/ Transplant, Depression, Obesity/ Bariatric Surgery, Rheumatoid Arthritis, -Neb albuterol, singulair, Ventolin hfa, Flonase, Qvar 80, BIPAP 14/9, PS 4/ [employer]                                                 Download-compliance 93%, AHI 5.4/ hr Body weight today-253 lbs Covid vax-3 Moderna, 2 Phizer Flu vax- today standard Comfortable with BIPAP but asked about alternatives. We discussed [facility] and he would like referral to look into it. We are calling now- Overnight oximetry on BIPAP was done as ordered in March but never sent to [name].  Addendum 12/11/21- Patient is clear from Sleep Medicine for Urologic procedure as planned. He should wear his BIPAP in recovery and should be monitored for hypoxemia/ respiratory distress.  12/10/22- 62 yoM followed for OSA, complicated by CAD/ CABG4, CHF, PAH, AFib/ Eliquis, Chronic Venous Insufficiency, Superficial Thrombophlebitis, OSA, OHS, Asthma moderate, Covid infection, DM2, Gout,  ESRD/ Transplant, Depression, Obesity/ Bariatric Surgery, Rheumatoid Arthritis, -Neb albuterol, singulair, Ventolin hfa, Flonase, Qvar 80, BIPAP 14/9, PS 4/ [employer]                                                 Download-compliance  Body weight today- Dopplers 11/01/22- NEG for DVT  CXR  11/01/22- IMPRESSION: No active cardiopulmonary disease.  ROS-see HPI   + = positive Constitutional:    weight loss, night sweats, fevers, chills, fatigue, lassitude. HEENT:    headaches, difficulty swallowing, tooth/dental problems, sore throat,       sneezing, itching, ear ache, nasal congestion, post nasal drip, snoring CV:    chest pain, orthopnea, PND, swelling in lower extremities, anasarca,                                   dizziness, palpitations Resp:   shortness of breath with exertion or at rest.                productive cough,   non-productive cough, coughing up of blood.              change in color of mucus.  wheezing.   Skin:    rash or lesions. GI:  No-   heartburn, indigestion, abdominal pain, nausea, vomiting, diarrhea,                 change in bowel habits, loss of appetite GU: dysuria, change in color of urine, no urgency or frequency.   flank pain. MS:   joint pain, stiffness, decreased range of motion, back pain. Neuro-     nothing unusual Psych:  change in  mood or affect.  depression or anxiety.   memory loss.  OBJ- Physical Exam General- Alert, Oriented, Affect-appropriate, Distress- none acute,  Skin- rash-none, lesions- none, excoriation- none Lymphadenopathy- none Head- atraumatic            Eyes- Gross vision intact, PERRLA, conjunctivae and secretions clear            Ears- +Hearing aids            Nose- Clear, no-Septal dev, mucus, polyps, erosion, perforation             Throat- Mallampati III-IV , mucosa clear , drainage- none, tonsils- atrophic Neck- flexible , trachea midline, no stridor , thyroid nl, carotid no bruit Chest - symmetrical excursion , unlabored           Heart/CV- RRR , no murmur , no gallop  , no rub, nl s1 s2                           - JVD- none , edema- none, stasis changes- none, varices- none           Lung- clear to P&A, wheeze- none, cough- none , dullness-none, rub- none           Chest wall-  Abd- + kidney R abd Br/ Gen/  Rectal- Not done, not indicated Extrem- +stasis changes calves Neuro- grossly intact to observation

## 2022-12-08 NOTE — Telephone Encounter (Signed)
Yes--I did refill his pain med

## 2022-12-10 ENCOUNTER — Other Ambulatory Visit: Payer: Self-pay

## 2022-12-10 ENCOUNTER — Ambulatory Visit: Payer: 59 | Admitting: Internal Medicine

## 2022-12-10 MED ORDER — GABAPENTIN 300 MG PO CAPS
300.0000 mg | ORAL_CAPSULE | Freq: Two times a day (BID) | ORAL | 0 refills | Status: DC
  Filled 2022-12-10: qty 180, 90d supply, fill #0

## 2022-12-12 ENCOUNTER — Encounter: Payer: Commercial Managed Care - PPO | Admitting: Physician Assistant

## 2022-12-12 DIAGNOSIS — Z87891 Personal history of nicotine dependence: Secondary | ICD-10-CM | POA: Diagnosis not present

## 2022-12-12 DIAGNOSIS — I132 Hypertensive heart and chronic kidney disease with heart failure and with stage 5 chronic kidney disease, or end stage renal disease: Secondary | ICD-10-CM | POA: Diagnosis not present

## 2022-12-12 DIAGNOSIS — I87333 Chronic venous hypertension (idiopathic) with ulcer and inflammation of bilateral lower extremity: Secondary | ICD-10-CM | POA: Diagnosis not present

## 2022-12-12 DIAGNOSIS — E1122 Type 2 diabetes mellitus with diabetic chronic kidney disease: Secondary | ICD-10-CM | POA: Diagnosis not present

## 2022-12-12 DIAGNOSIS — S81812A Laceration without foreign body, left lower leg, initial encounter: Secondary | ICD-10-CM | POA: Diagnosis not present

## 2022-12-12 DIAGNOSIS — L97822 Non-pressure chronic ulcer of other part of left lower leg with fat layer exposed: Secondary | ICD-10-CM | POA: Diagnosis not present

## 2022-12-12 DIAGNOSIS — N186 End stage renal disease: Secondary | ICD-10-CM | POA: Diagnosis not present

## 2022-12-12 DIAGNOSIS — L97812 Non-pressure chronic ulcer of other part of right lower leg with fat layer exposed: Secondary | ICD-10-CM | POA: Diagnosis not present

## 2022-12-12 DIAGNOSIS — I5042 Chronic combined systolic (congestive) and diastolic (congestive) heart failure: Secondary | ICD-10-CM | POA: Diagnosis not present

## 2022-12-12 DIAGNOSIS — E11622 Type 2 diabetes mellitus with other skin ulcer: Secondary | ICD-10-CM | POA: Diagnosis not present

## 2022-12-12 DIAGNOSIS — I890 Lymphedema, not elsewhere classified: Secondary | ICD-10-CM | POA: Diagnosis not present

## 2022-12-12 NOTE — Progress Notes (Signed)
photographs - any number of wounds) []  - 0 Wound Tracing (instead of photographs) []  - 0 Simple Wound Measurement - one wound X- 2 5 Complex Wound Measurement - multiple wounds INTERVENTIONS - Wound Dressings X - Small Wound Dressing one or multiple wounds 1 10 X- 1 15 Medium Wound Dressing one or multiple wounds []  - 0 Large Wound Dressing one or multiple wounds X- 1 5 Application of Medications - topical []  - 0 Application of Medications - injection INTERVENTIONS - Miscellaneous []  - 0 External ear exam []  - 0 Specimen Collection (cultures, biopsies, blood, body fluids, etc.) []  - 0 Specimen(s) / Culture(s) sent or taken to Lab for analysis [name] ([id number]) [id number] Page 3 of 11 []  - 0 Patient Transfer (multiple staff / Nurse, adult / Similar devices) []  - 0 Simple Staple / Suture removal (25 or less) []  - 0 Complex Staple / Suture removal (26 or more) []  - 0 Hypo / Hyperglycemic Management (close monitor of Blood Glucose) []  - 0 Ankle / Brachial Index (ABI) - do not check if billed separately X- 1 5 Vital Signs Has the patient been seen at the hospital within the last three years: Yes Total Score: 125 Level Of Care: New/Established - Level 4 Electronic Signature(s) Signed: 12/12/2022 4:38:26 PM By: [name] Entered By: [name] on 12/12/2022 13:21:34 -------------------------------------------------------------------------------- Encounter Discharge Information Details Patient Name: Date of Service: [name], Carl Madrid [name]. 12/12/2022 12:00 PM Medical Record Number: [id number] Patient Account Number: [id number] Date of Birth/Sex: Treating RN: 12/27/1960 (62 y.o. [name] Primary Care Vonnie Spagnolo: [name] Other Clinician: Referring Hayzlee Mcsorley: Treating Alycia Cooperwood/Extender: Derrek Monaco in Treatment: 1 Encounter Discharge Information Items Discharge Condition: Stable Ambulatory Status: Ambulatory Discharge Destination: Home Transportation: Private Auto Accompanied By: spouse Schedule Follow-up Appointment: Yes Clinical Summary of Care: Electronic Signature(s) Signed: 12/12/2022 1:22:56 PM By: [name] Entered By: [name] on 12/12/2022 13:22:56 -------------------------------------------------------------------------------- Lower Extremity Assessment Details Patient Name: Date of Service: [name] 12/12/2022 12:00 PM Medical Record Number: [id number] Patient Account Number: [id number] Date of Birth/Sex: Treating RN: 12/26/1960 (62 y.o. [name] Primary Care Jlynn Langille: [name] Other Clinician: [name], [name] ([id number]) [id number] Page 4 of 11 Referring Wilber Fini: Treating Adonica Fukushima/Extender: Derrek Monaco in Treatment: 1 Edema Assessment Assessed: [Left: No] [Right: No] Edema: [Left: Yes] [Right: Yes] Calf Left: Right: Point of Measurement: 34 cm From Medial Instep 38 cm 38.2 cm Ankle Left: Right: Point of Measurement: 12 cm From Medial Instep 23.3 cm 24 cm Vascular Assessment Pulses: Dorsalis Pedis Palpable: [Left:Yes] [Right:Yes] Extremity colors, hair growth, and conditions: Extremity Color: [Left:Hyperpigmented] [Right:Hyperpigmented] Hair Growth on Extremity: [Left:No] [Right:No] Temperature of Extremity: [Left:Warm < 3 seconds] [Right:Warm < 3 seconds] Toe Nail Assessment Left: Right: Thick: No No Discolored: No No Deformed: No No Improper Length and Hygiene: No No Electronic Signature(s) Signed: 12/12/2022 4:38:26 PM By: [name] Entered By: [name] on 12/12/2022  12:30:36 -------------------------------------------------------------------------------- Multi Wound Chart Details Patient Name: Date of Service: [name], Carl Madrid [name]. 12/12/2022 12:00 PM Medical Record Number: [id number] Patient Account Number: [id number] Date of Birth/Sex: Treating RN: 1960/05/05 (62 y.o. [name] Primary Care Edmon Magid: [name] Other Clinician: Referring Dianne Whelchel: Treating Aeralyn Barna/Extender: Derrek Monaco in Treatment: 1 Vital Signs Height(in): 70 Pulse(bpm): 47 Weight(lbs): 253 Blood Pressure(mmHg): 159/94 Body Mass Index(BMI): 36.3 Temperature([name]): 98 Respiratory Rate(breaths/min): 18 [7:Photos:] [N/A:N/A [id number] Page 5 of 11] Right Lower Leg Left, Circumferential Lower Leg N/A Wound  Secured With Medipore T - 56M Medipore H Soft Cloth Surgical T ape ape, 2x2 (in/yd) Kerlix Roll Sterile or Non-Sterile 6-ply 4.5x4 (yd/yd) Discharge Instruction: Apply Kerlix as directed Tubigrip Size D, 3x10 (in/yd) Compression Wrap Compression Stockings Add-Ons Electronic Signature(s) Signed: 12/12/2022 4:38:26 PM By: [name] Entered By: [name] on 12/12/2022 12:45:01 -------------------------------------------------------------------------------- Wound Assessment Details Patient Name: Date of Service: [name] [name]. 12/12/2022 12:00 PM Medical Record Number: [id number] Patient Account Number: [id number] Date of Birth/Sex: Treating RN: [birth year] (62 y.o. [name] Primary Care Coby Antrobus: [name] Other Clinician: Referring Quintell Bonnin: Treating Burhan Barham/Extender: Derrek Monaco in Treatment: 1 Wound Status [name] ([id number]) [id number] Page 10 of 11 Wound Number: 8 Primary Skin T ear Etiology: Wound Location: Left, Circumferential Lower Leg Wound Open Wounding Event: Skin Tear/Laceration Status: Date Acquired: 11/30/2022 Comorbid Cataracts, Anemia, Lymphedema, Asthma, Sleep Apnea, Weeks Of Treatment: 1 History: Arrhythmia, Congestive Heart Failure, Coronary Artery Disease, Clustered Wound: No Hypertension, Type II Diabetes, End Stage Renal Disease, Gout, Osteoarthritis, Neuropathy Photos Wound Measurements Length: (cm) 12 Width: (cm) 9 Depth:  (cm) 0.1 Area: (cm) 84.823 Volume: (cm) 8.482 % Reduction in Area: 35.2% % Reduction in Volume: 35.2% Epithelialization: Small (1-33%) Tunneling: No Undermining: No Wound Description Classification: Full Thickness Without Exposed Support Structures Exudate Amount: Medium Exudate Type: Serosanguineous Exudate Color: red, brown Foul Odor After Cleansing: No Slough/Fibrino Yes Wound Bed Granulation Amount: Large (67-100%) Exposed Structure Granulation Quality: Red, Pink, Friable Fat Layer (Subcutaneous Tissue) Exposed: Yes Necrotic Amount: Small (1-33%) Necrotic Quality: Adherent Slough Treatment Notes Wound #8 (Lower Leg) Wound Laterality: Left, Circumferential Cleanser Soap and Water Discharge Instruction: Gently cleanse wound with antibacterial soap, rinse and pat dry prior to dressing wounds Wound Cleanser Discharge Instruction: Wash your hands with soap and water. Remove old dressing, discard into plastic bag and place into trash. Cleanse the wound with Wound Cleanser prior to applying a clean dressing using gauze sponges, not tissues or cotton balls. Do not scrub or use excessive force. Pat dry using gauze sponges, not tissue or cotton balls. Peri-Wound Care Topical Primary Dressing Xeroform 5x9-HBD (in/in) Discharge Instruction: Apply Xeroform 5x9-HBD (in/in) as directed Secondary Dressing ABD Pad 5x9 (in/in) Discharge Instruction: Cover with ABD pad Zetuvit Plus 4x8 (in/in) Secured With Medipore T - 56M Medipore H Soft Cloth Surgical T ape ape, 2x2 (in/yd) Kerlix Roll Sterile or Non-Sterile 6-ply 4.5x4 (yd/yd) Discharge Instruction: Apply Kerlix as directed Tubigrip Size D, 3x10 (in/yd) Compression Wrap Compression Stockings ONIEL, KAREN [name] ([id number]) [id number] Page 11 of 11 Add-Ons Electronic Signature(s) Signed: 12/12/2022 4:38:26 PM By: [name] Entered By: [name] on 12/12/2022  12:44:52 -------------------------------------------------------------------------------- Vitals Details Patient Name: Date of Service: [name], Rodd [name]. 12/12/2022 12:00 PM Medical Record Number: [id number] Patient Account Number: [id number] Date of Birth/Sex: Treating RN: Oct 23, 1960 (62 y.o. [name] Primary Care Jayke Caul: [name] Other Clinician: Referring Sherlonda Flater: Treating Safiatou Islam/Extender: Derrek Monaco in Treatment: 1 Vital Signs Time Taken: 12:14 Temperature ([name]): 98 Height (in): 70 Pulse (bpm): 47 Weight (lbs): 253 Respiratory Rate (breaths/min): 18 Body Mass Index (BMI): 36.3 Blood Pressure (mmHg): 159/94 Reference Range: 80 - 120 mg / dl Electronic Signature(s) Signed: 12/12/2022 4:38:26 PM By: [name] Entered By: [name] on 12/12/2022 12:16:55  Location: Blister Skin Tear/Laceration N/A Wounding Event: Diabetic Wound/Ulcer of the Lower Skin Tear N/A Primary Etiology: Extremity Cataracts, Anemia, Lymphedema, Cataracts, Anemia, Lymphedema, N/A Comorbid History: Asthma, Sleep Apnea, Arrhythmia, Asthma, Sleep Apnea, Arrhythmia, Congestive Heart Failure, Coronary Congestive Heart Failure, Coronary Artery Disease, Hypertension, Type IIArtery Disease, Hypertension, Type II Diabetes, End Stage Renal Disease, Diabetes, End Stage Renal Disease, Gout, Osteoarthritis, Neuropathy Gout, Osteoarthritis, Neuropathy 11/21/2022 11/30/2022 N/A Date Acquired: 1 1 N/A Weeks of Treatment: Open Open N/A Wound Status: No No N/A Wound Recurrence: 1.2x1.9x0.1 12x9x0.1 N/A Measurements L x W x D (cm) 1.791 84.823 N/A A (cm) : rea 0.179 8.482 N/A Volume (cm) : 64.40% 35.20% N/A % Reduction in Area: 64.40% 35.20% N/A % Reduction in Volume: Grade 2 Full Thickness Without Exposed N/A Classification: Support Structures Medium Medium N/A Exudate A mount: Serosanguineous Serosanguineous N/A Exudate Type: red, brown red, brown N/A Exudate Color: Large (67-100%) Large  (67-100%) N/A Granulation A mount: Red, Friable Red, Pink, Friable N/A Granulation Quality: Small (1-33%) Small (1-33%) N/A Necrotic A mount: Fat Layer (Subcutaneous Tissue): Yes Fat Layer (Subcutaneous Tissue): Yes N/A Exposed Structures: Small (1-33%) Small (1-33%) N/A Epithelialization: Treatment Notes Electronic Signature(s) Signed: 12/12/2022 1:20:38 PM By: [name] Entered By: [name] on 12/12/2022 13:20:38 -------------------------------------------------------------------------------- Multi-Disciplinary Care Plan Details Patient Name: Date of Service: [name], Carl Madrid [name]. 12/12/2022 12:00 PM Medical Record Number: [id number] Patient Account Number: [id number] Date of Birth/Sex: Treating RN: [birth year] (62 y.o. [name] Primary Care Amanii Snethen: [name] Other Clinician: Referring Recie Cirrincione: Treating Nasir Bright/Extender: Derrek Monaco in Treatment: 1 Active Inactive Necrotic Tissue Nursing Diagnoses: Impaired tissue integrity related to necrotic/devitalized tissue Knowledge deficit related to management of necrotic/devitalized tissue Goals: Necrotic/devitalized tissue will be minimized in the wound bed CHRISTY, ARQUILLA [name] ([id number]) [id number] Page 6 of 11 Date Initiated: 12/05/2022 Target Resolution Date: 01/30/2023 Goal Status: Active Patient/caregiver will verbalize understanding of reason and process for debridement of necrotic tissue Date Initiated: 12/05/2022 Date Inactivated: 12/05/2022 Target Resolution Date: 12/05/2022 Goal Status: Met Interventions: Assess patient pain level pre-, during and post procedure and prior to discharge Provide education on necrotic tissue and debridement process Treatment Activities: Apply topical anesthetic as ordered : 12/05/2022 Notes: Pain, Acute or Chronic Nursing Diagnoses: Pain, acute or chronic: actual or potential Goals: Patient will verbalize adequate pain  control and receive pain control interventions during procedures as needed Date Initiated: 12/05/2022 Target Resolution Date: 01/02/2023 Goal Status: Active Patient/caregiver will verbalize adequate pain control between visits Date Initiated: 12/05/2022 Target Resolution Date: 01/02/2023 Goal Status: Active Patient/caregiver will verbalize comfort level met Date Initiated: 12/05/2022 Target Resolution Date: 01/02/2023 Goal Status: Active Interventions: Complete pain assessment as per visit requirements Provide education on pain management Provision of support: recognize patient pain, provide comfort and support as needed Reposition patient for comfort Notes: Wound/Skin Impairment Nursing Diagnoses: Impaired tissue integrity Knowledge deficit related to ulceration/compromised skin integrity Goals: Ulcer/skin breakdown will have a volume reduction of 30% by week 4 Date Initiated: 12/05/2022 Target Resolution Date: 01/02/2023 Goal Status: Active Ulcer/skin breakdown will have a volume reduction of 50% by week 8 Date Initiated: 12/05/2022 Target Resolution Date: 01/30/2023 Goal Status: Active Ulcer/skin breakdown will have a volume reduction of 80% by week 12 Date Initiated: 12/05/2022 Target Resolution Date: 02/27/2023 Goal Status: Active Ulcer/skin breakdown will heal within 14 weeks Date Initiated: 12/05/2022 Target Resolution Date: 03/13/2023 Goal Status: Active Interventions: Assess patient/caregiver ability to obtain necessary supplies Assess patient/caregiver ability to perform ulcer/skin care regimen upon admission and  photographs - any number of wounds) []  - 0 Wound Tracing (instead of photographs) []  - 0 Simple Wound Measurement - one wound X- 2 5 Complex Wound Measurement - multiple wounds INTERVENTIONS - Wound Dressings X - Small Wound Dressing one or multiple wounds 1 10 X- 1 15 Medium Wound Dressing one or multiple wounds []  - 0 Large Wound Dressing one or multiple wounds X- 1 5 Application of Medications - topical []  - 0 Application of Medications - injection INTERVENTIONS - Miscellaneous []  - 0 External ear exam []  - 0 Specimen Collection (cultures, biopsies, blood, body fluids, etc.) []  - 0 Specimen(s) / Culture(s) sent or taken to Lab for analysis [name] ([id number]) [id number] Page 3 of 11 []  - 0 Patient Transfer (multiple staff / Nurse, adult / Similar devices) []  - 0 Simple Staple / Suture removal (25 or less) []  - 0 Complex Staple / Suture removal (26 or more) []  - 0 Hypo / Hyperglycemic Management (close monitor of Blood Glucose) []  - 0 Ankle / Brachial Index (ABI) - do not check if billed separately X- 1 5 Vital Signs Has the patient been seen at the hospital within the last three years: Yes Total Score: 125 Level Of Care: New/Established - Level 4 Electronic Signature(s) Signed: 12/12/2022 4:38:26 PM By: [name] Entered By: [name] on 12/12/2022 13:21:34 -------------------------------------------------------------------------------- Encounter Discharge Information Details Patient Name: Date of Service: [name], Carl Madrid [name]. 12/12/2022 12:00 PM Medical Record Number: [id number] Patient Account Number: [id number] Date of Birth/Sex: Treating RN: 12/27/1960 (62 y.o. [name] Primary Care Vonnie Spagnolo: [name] Other Clinician: Referring Hayzlee Mcsorley: Treating Alycia Cooperwood/Extender: Derrek Monaco in Treatment: 1 Encounter Discharge Information Items Discharge Condition: Stable Ambulatory Status: Ambulatory Discharge Destination: Home Transportation: Private Auto Accompanied By: spouse Schedule Follow-up Appointment: Yes Clinical Summary of Care: Electronic Signature(s) Signed: 12/12/2022 1:22:56 PM By: [name] Entered By: [name] on 12/12/2022 13:22:56 -------------------------------------------------------------------------------- Lower Extremity Assessment Details Patient Name: Date of Service: [name] 12/12/2022 12:00 PM Medical Record Number: [id number] Patient Account Number: [id number] Date of Birth/Sex: Treating RN: 12/26/1960 (62 y.o. [name] Primary Care Jlynn Langille: [name] Other Clinician: [name], [name] ([id number]) [id number] Page 4 of 11 Referring Wilber Fini: Treating Adonica Fukushima/Extender: Derrek Monaco in Treatment: 1 Edema Assessment Assessed: [Left: No] [Right: No] Edema: [Left: Yes] [Right: Yes] Calf Left: Right: Point of Measurement: 34 cm From Medial Instep 38 cm 38.2 cm Ankle Left: Right: Point of Measurement: 12 cm From Medial Instep 23.3 cm 24 cm Vascular Assessment Pulses: Dorsalis Pedis Palpable: [Left:Yes] [Right:Yes] Extremity colors, hair growth, and conditions: Extremity Color: [Left:Hyperpigmented] [Right:Hyperpigmented] Hair Growth on Extremity: [Left:No] [Right:No] Temperature of Extremity: [Left:Warm < 3 seconds] [Right:Warm < 3 seconds] Toe Nail Assessment Left: Right: Thick: No No Discolored: No No Deformed: No No Improper Length and Hygiene: No No Electronic Signature(s) Signed: 12/12/2022 4:38:26 PM By: [name] Entered By: [name] on 12/12/2022  12:30:36 -------------------------------------------------------------------------------- Multi Wound Chart Details Patient Name: Date of Service: [name], Carl Madrid [name]. 12/12/2022 12:00 PM Medical Record Number: [id number] Patient Account Number: [id number] Date of Birth/Sex: Treating RN: 1960/05/05 (62 y.o. [name] Primary Care Edmon Magid: [name] Other Clinician: Referring Dianne Whelchel: Treating Aeralyn Barna/Extender: Derrek Monaco in Treatment: 1 Vital Signs Height(in): 70 Pulse(bpm): 47 Weight(lbs): 253 Blood Pressure(mmHg): 159/94 Body Mass Index(BMI): 36.3 Temperature([name]): 98 Respiratory Rate(breaths/min): 18 [7:Photos:] [N/A:N/A [id number].pdf Page 5 of 11] Right Lower Leg Left, Circumferential Lower Leg N/A Wound  Location: Blister Skin Tear/Laceration N/A Wounding Event: Diabetic Wound/Ulcer of the Lower Skin Tear N/A Primary Etiology: Extremity Cataracts, Anemia, Lymphedema, Cataracts, Anemia, Lymphedema, N/A Comorbid History: Asthma, Sleep Apnea, Arrhythmia, Asthma, Sleep Apnea, Arrhythmia, Congestive Heart Failure, Coronary Congestive Heart Failure, Coronary Artery Disease, Hypertension, Type IIArtery Disease, Hypertension, Type II Diabetes, End Stage Renal Disease, Diabetes, End Stage Renal Disease, Gout, Osteoarthritis, Neuropathy Gout, Osteoarthritis, Neuropathy 11/21/2022 11/30/2022 N/A Date Acquired: 1 1 N/A Weeks of Treatment: Open Open N/A Wound Status: No No N/A Wound Recurrence: 1.2x1.9x0.1 12x9x0.1 N/A Measurements L x W x D (cm) 1.791 84.823 N/A A (cm) : rea 0.179 8.482 N/A Volume (cm) : 64.40% 35.20% N/A % Reduction in Area: 64.40% 35.20% N/A % Reduction in Volume: Grade 2 Full Thickness Without Exposed N/A Classification: Support Structures Medium Medium N/A Exudate A mount: Serosanguineous Serosanguineous N/A Exudate Type: red, brown red, brown N/A Exudate Color: Large (67-100%) Large  (67-100%) N/A Granulation A mount: Red, Friable Red, Pink, Friable N/A Granulation Quality: Small (1-33%) Small (1-33%) N/A Necrotic A mount: Fat Layer (Subcutaneous Tissue): Yes Fat Layer (Subcutaneous Tissue): Yes N/A Exposed Structures: Small (1-33%) Small (1-33%) N/A Epithelialization: Treatment Notes Electronic Signature(s) Signed: 12/12/2022 1:20:38 PM By: [name] Entered By: [name] on 12/12/2022 13:20:38 -------------------------------------------------------------------------------- Multi-Disciplinary Care Plan Details Patient Name: Date of Service: [name], Carl Madrid [name]. 12/12/2022 12:00 PM Medical Record Number: [id number] Patient Account Number: [id number] Date of Birth/Sex: Treating RN: [birth year] (62 y.o. [name] Primary Care Amanii Snethen: [name] Other Clinician: Referring Recie Cirrincione: Treating Nasir Bright/Extender: Derrek Monaco in Treatment: 1 Active Inactive Necrotic Tissue Nursing Diagnoses: Impaired tissue integrity related to necrotic/devitalized tissue Knowledge deficit related to management of necrotic/devitalized tissue Goals: Necrotic/devitalized tissue will be minimized in the wound bed CHRISTY, ARQUILLA [name] ([id number]) [id number] Page 6 of 11 Date Initiated: 12/05/2022 Target Resolution Date: 01/30/2023 Goal Status: Active Patient/caregiver will verbalize understanding of reason and process for debridement of necrotic tissue Date Initiated: 12/05/2022 Date Inactivated: 12/05/2022 Target Resolution Date: 12/05/2022 Goal Status: Met Interventions: Assess patient pain level pre-, during and post procedure and prior to discharge Provide education on necrotic tissue and debridement process Treatment Activities: Apply topical anesthetic as ordered : 12/05/2022 Notes: Pain, Acute or Chronic Nursing Diagnoses: Pain, acute or chronic: actual or potential Goals: Patient will verbalize adequate pain  control and receive pain control interventions during procedures as needed Date Initiated: 12/05/2022 Target Resolution Date: 01/02/2023 Goal Status: Active Patient/caregiver will verbalize adequate pain control between visits Date Initiated: 12/05/2022 Target Resolution Date: 01/02/2023 Goal Status: Active Patient/caregiver will verbalize comfort level met Date Initiated: 12/05/2022 Target Resolution Date: 01/02/2023 Goal Status: Active Interventions: Complete pain assessment as per visit requirements Provide education on pain management Provision of support: recognize patient pain, provide comfort and support as needed Reposition patient for comfort Notes: Wound/Skin Impairment Nursing Diagnoses: Impaired tissue integrity Knowledge deficit related to ulceration/compromised skin integrity Goals: Ulcer/skin breakdown will have a volume reduction of 30% by week 4 Date Initiated: 12/05/2022 Target Resolution Date: 01/02/2023 Goal Status: Active Ulcer/skin breakdown will have a volume reduction of 50% by week 8 Date Initiated: 12/05/2022 Target Resolution Date: 01/30/2023 Goal Status: Active Ulcer/skin breakdown will have a volume reduction of 80% by week 12 Date Initiated: 12/05/2022 Target Resolution Date: 02/27/2023 Goal Status: Active Ulcer/skin breakdown will heal within 14 weeks Date Initiated: 12/05/2022 Target Resolution Date: 03/13/2023 Goal Status: Active Interventions: Assess patient/caregiver ability to obtain necessary supplies Assess patient/caregiver ability to perform ulcer/skin care regimen upon admission and

## 2022-12-12 NOTE — Progress Notes (Addendum)
of Birth/Sex: Treating RN: [birth year] (62 y.o. [name] Primary Care Provider: [name] Other Clinician: Referring Provider: Treating Provider/Extender: Derrek Monaco in Treatment: 1 Diagnosis Coding ICD-10 Codes Code Description [phone] Chronic venous hypertension (idiopathic) with ulcer and inflammation of bilateral lower extremity I89.0 Lymphedema, not elsewhere classified E11.622 Type 2 diabetes mellitus with other skin ulcer L97.812 Non-pressure chronic ulcer of other part of right lower leg with fat layer exposed S81.812A Laceration without foreign body, left lower leg, initial encounter L97.822 Non-pressure chronic ulcer of other part of left lower leg with fat layer exposed Z94.0 Kidney transplant status I10 Essential (primary) hypertension I50.42 Chronic combined systolic (congestive) and diastolic (congestive) heart failure I48.0 Paroxysmal atrial fibrillation Z79.01 Long term (current) use of  anticoagulants Facility Procedures : CPT4 Code: 04540981 Description: 99214 - WOUND CARE VISIT-LEV 4 EST PT Modifier: Quantity: 1 Physician Procedures : CPT4 Code Description Modifier 1914782 99213 - WC PHYS LEVEL 3 - EST PT ICD-10 Diagnosis Description I87.333 Chronic venous hypertension (idiopathic) with ulcer and inflammation of bilateral lower extremity I89.0 Lymphedema, not elsewhere classified  E11.622 Type 2 diabetes mellitus with other skin ulcer L97.812 Non-pressure chronic ulcer of other part of right lower leg with fat layer exposed Quantity: 1 Electronic Signature(s) Signed: 12/12/2022 1:49:29 PM By: [name] PA-C Previous Signature: 12/12/2022 1:21:40 PM Version By: [name] Entered By: [name] on 12/12/2022 13:49:29  small opening today. I think he is at the point where hopefully by next week this will be resolved. Nonetheless I do feel like that he is still having quite a bit of an issue here all things considered. Readmission: 12-05-2022 upon evaluation today patient appears to be doing poorly currently in regard to his wound. With that being said I  do believe that the patient is currently having some issues again has been little over a year since I last saw him March 2023. At this point he tells me he has 2 issues 1 where he hit his left leg on a piece of furniture which I think is the primary concern there. The other issue is that of just having blisters popping up on his right leg. He tells me he is really not wearing compression socks regularly maybe 3 times per week when I pinned him down on it is the most that he is wearing these and not even certain if that regular or not to be perfectly honest. With that being said I do believe that he would benefit from a likely the compression therapy but at the same time he has such tremendous pain in the past he is always refused this therefore I think Tubigrip is probably the best way to go at this point considering he cannot even get his compression socks on currently due to the pain. Patient does have a past medical history which is significant for chronic venous hypertension, lymphedema, diabetes mellitus type 2, he is a kidney transplant patient, has hypertension, congestive heart failure, atrial fibrillation, and is on long-term anticoagulant therapy in the form of Eliquis. 12-12-2022 upon evaluation today patient appears to be doing well with regard to his wound he does have a lot of new skin growth which is good news unfortunately however has had a lot of burning and pain with the Hydrofera Blue that also seem to be sticking and causing a lot of bleeding which I do not like. We may need to try something a little bit different here. Electronic Signature(s) Signed: 12/12/2022 1:48:14 PM By: [name] PA-C Entered By: [name] on 12/12/2022 13:48:14 [name] ([id number]) [id number] Page 3 of 9 -------------------------------------------------------------------------------- Physical Exam Details Patient Name: Date of Service: [name], [name] 12/12/2022  12:00 PM Medical Record Number: [id number] Patient Account Number: [id number] Date of Birth/Sex: Treating RN: 1961/02/12 (62 y.o. [name] Primary Care Provider: [name] Other Clinician: Referring Provider: Treating Provider/Extender: Derrek Monaco in Treatment: 1 Constitutional Well-nourished and well-hydrated in no acute distress. Respiratory normal breathing without difficulty. Psychiatric this patient is able to make decisions and demonstrates good insight into disease process. Alert and Oriented x 3. pleasant and cooperative. Notes Upon inspection patient's wound bed actually showed signs of good granulation epithelization at this point. Fortunately I do not see any signs of worsening overall and I do believe that the patient is making headway towards closure with that being said with the irritation that he is having I see no signs of infection but I do believe that he may require a different dressing to prevent sticking and causing further issues here. Electronic Signature(s) Signed: 12/12/2022 1:48:41 PM By: [name] PA-C Entered By: [name] on 12/12/2022 13:48:41 -------------------------------------------------------------------------------- Physician Orders Details Patient Name: Date of Service: [name] F. 12/12/2022 12:00 PM Medical Record Number: [id number] Patient Account Number: [id number] Date of Birth/Sex: Treating RN: [birth year] (62 y.o. [name] Primary Care Provider: [name] Other  of Birth/Sex: Treating RN: [birth year] (62 y.o. [name] Primary Care Provider: [name] Other Clinician: Referring Provider: Treating Provider/Extender: Derrek Monaco in Treatment: 1 Diagnosis Coding ICD-10 Codes Code Description [phone] Chronic venous hypertension (idiopathic) with ulcer and inflammation of bilateral lower extremity I89.0 Lymphedema, not elsewhere classified E11.622 Type 2 diabetes mellitus with other skin ulcer L97.812 Non-pressure chronic ulcer of other part of right lower leg with fat layer exposed S81.812A Laceration without foreign body, left lower leg, initial encounter L97.822 Non-pressure chronic ulcer of other part of left lower leg with fat layer exposed Z94.0 Kidney transplant status I10 Essential (primary) hypertension I50.42 Chronic combined systolic (congestive) and diastolic (congestive) heart failure I48.0 Paroxysmal atrial fibrillation Z79.01 Long term (current) use of  anticoagulants Facility Procedures : CPT4 Code: 04540981 Description: 99214 - WOUND CARE VISIT-LEV 4 EST PT Modifier: Quantity: 1 Physician Procedures : CPT4 Code Description Modifier 1914782 99213 - WC PHYS LEVEL 3 - EST PT ICD-10 Diagnosis Description I87.333 Chronic venous hypertension (idiopathic) with ulcer and inflammation of bilateral lower extremity I89.0 Lymphedema, not elsewhere classified  E11.622 Type 2 diabetes mellitus with other skin ulcer L97.812 Non-pressure chronic ulcer of other part of right lower leg with fat layer exposed Quantity: 1 Electronic Signature(s) Signed: 12/12/2022 1:49:29 PM By: [name] PA-C Previous Signature: 12/12/2022 1:21:40 PM Version By: [name] Entered By: [name] on 12/12/2022 13:49:29  of Birth/Sex: Treating RN: [birth year] (62 y.o. [name] Primary Care Provider: [name] Other Clinician: Referring Provider: Treating Provider/Extender: Derrek Monaco in Treatment: 1 Diagnosis Coding ICD-10 Codes Code Description [phone] Chronic venous hypertension (idiopathic) with ulcer and inflammation of bilateral lower extremity I89.0 Lymphedema, not elsewhere classified E11.622 Type 2 diabetes mellitus with other skin ulcer L97.812 Non-pressure chronic ulcer of other part of right lower leg with fat layer exposed S81.812A Laceration without foreign body, left lower leg, initial encounter L97.822 Non-pressure chronic ulcer of other part of left lower leg with fat layer exposed Z94.0 Kidney transplant status I10 Essential (primary) hypertension I50.42 Chronic combined systolic (congestive) and diastolic (congestive) heart failure I48.0 Paroxysmal atrial fibrillation Z79.01 Long term (current) use of  anticoagulants Facility Procedures : CPT4 Code: 04540981 Description: 99214 - WOUND CARE VISIT-LEV 4 EST PT Modifier: Quantity: 1 Physician Procedures : CPT4 Code Description Modifier 1914782 99213 - WC PHYS LEVEL 3 - EST PT ICD-10 Diagnosis Description I87.333 Chronic venous hypertension (idiopathic) with ulcer and inflammation of bilateral lower extremity I89.0 Lymphedema, not elsewhere classified  E11.622 Type 2 diabetes mellitus with other skin ulcer L97.812 Non-pressure chronic ulcer of other part of right lower leg with fat layer exposed Quantity: 1 Electronic Signature(s) Signed: 12/12/2022 1:49:29 PM By: [name] PA-C Previous Signature: 12/12/2022 1:21:40 PM Version By: [name] Entered By: [name] on 12/12/2022 13:49:29  small opening today. I think he is at the point where hopefully by next week this will be resolved. Nonetheless I do feel like that he is still having quite a bit of an issue here all things considered. Readmission: 12-05-2022 upon evaluation today patient appears to be doing poorly currently in regard to his wound. With that being said I  do believe that the patient is currently having some issues again has been little over a year since I last saw him March 2023. At this point he tells me he has 2 issues 1 where he hit his left leg on a piece of furniture which I think is the primary concern there. The other issue is that of just having blisters popping up on his right leg. He tells me he is really not wearing compression socks regularly maybe 3 times per week when I pinned him down on it is the most that he is wearing these and not even certain if that regular or not to be perfectly honest. With that being said I do believe that he would benefit from a likely the compression therapy but at the same time he has such tremendous pain in the past he is always refused this therefore I think Tubigrip is probably the best way to go at this point considering he cannot even get his compression socks on currently due to the pain. Patient does have a past medical history which is significant for chronic venous hypertension, lymphedema, diabetes mellitus type 2, he is a kidney transplant patient, has hypertension, congestive heart failure, atrial fibrillation, and is on long-term anticoagulant therapy in the form of Eliquis. 12-12-2022 upon evaluation today patient appears to be doing well with regard to his wound he does have a lot of new skin growth which is good news unfortunately however has had a lot of burning and pain with the Hydrofera Blue that also seem to be sticking and causing a lot of bleeding which I do not like. We may need to try something a little bit different here. Electronic Signature(s) Signed: 12/12/2022 1:48:14 PM By: [name] PA-C Entered By: [name] on 12/12/2022 13:48:14 [name] ([id number]) [id number] Page 3 of 9 -------------------------------------------------------------------------------- Physical Exam Details Patient Name: Date of Service: [name], [name] 12/12/2022  12:00 PM Medical Record Number: [id number] Patient Account Number: [id number] Date of Birth/Sex: Treating RN: 1961/02/12 (62 y.o. [name] Primary Care Provider: [name] Other Clinician: Referring Provider: Treating Provider/Extender: Derrek Monaco in Treatment: 1 Constitutional Well-nourished and well-hydrated in no acute distress. Respiratory normal breathing without difficulty. Psychiatric this patient is able to make decisions and demonstrates good insight into disease process. Alert and Oriented x 3. pleasant and cooperative. Notes Upon inspection patient's wound bed actually showed signs of good granulation epithelization at this point. Fortunately I do not see any signs of worsening overall and I do believe that the patient is making headway towards closure with that being said with the irritation that he is having I see no signs of infection but I do believe that he may require a different dressing to prevent sticking and causing further issues here. Electronic Signature(s) Signed: 12/12/2022 1:48:41 PM By: [name] PA-C Entered By: [name] on 12/12/2022 13:48:41 -------------------------------------------------------------------------------- Physician Orders Details Patient Name: Date of Service: [name] F. 12/12/2022 12:00 PM Medical Record Number: [id number] Patient Account Number: [id number] Date of Birth/Sex: Treating RN: [birth year] (62 y.o. [name] Primary Care Provider: [name] Other  of Birth/Sex: Treating RN: [birth year] (62 y.o. [name] Primary Care Provider: [name] Other Clinician: Referring Provider: Treating Provider/Extender: Derrek Monaco in Treatment: 1 Diagnosis Coding ICD-10 Codes Code Description [phone] Chronic venous hypertension (idiopathic) with ulcer and inflammation of bilateral lower extremity I89.0 Lymphedema, not elsewhere classified E11.622 Type 2 diabetes mellitus with other skin ulcer L97.812 Non-pressure chronic ulcer of other part of right lower leg with fat layer exposed S81.812A Laceration without foreign body, left lower leg, initial encounter L97.822 Non-pressure chronic ulcer of other part of left lower leg with fat layer exposed Z94.0 Kidney transplant status I10 Essential (primary) hypertension I50.42 Chronic combined systolic (congestive) and diastolic (congestive) heart failure I48.0 Paroxysmal atrial fibrillation Z79.01 Long term (current) use of  anticoagulants Facility Procedures : CPT4 Code: 04540981 Description: 99214 - WOUND CARE VISIT-LEV 4 EST PT Modifier: Quantity: 1 Physician Procedures : CPT4 Code Description Modifier 1914782 99213 - WC PHYS LEVEL 3 - EST PT ICD-10 Diagnosis Description I87.333 Chronic venous hypertension (idiopathic) with ulcer and inflammation of bilateral lower extremity I89.0 Lymphedema, not elsewhere classified  E11.622 Type 2 diabetes mellitus with other skin ulcer L97.812 Non-pressure chronic ulcer of other part of right lower leg with fat layer exposed Quantity: 1 Electronic Signature(s) Signed: 12/12/2022 1:49:29 PM By: [name] PA-C Previous Signature: 12/12/2022 1:21:40 PM Version By: [name] Entered By: [name] on 12/12/2022 13:49:29  of Birth/Sex: Treating RN: [birth year] (62 y.o. [name] Primary Care Provider: [name] Other Clinician: Referring Provider: Treating Provider/Extender: Derrek Monaco in Treatment: 1 Diagnosis Coding ICD-10 Codes Code Description [phone] Chronic venous hypertension (idiopathic) with ulcer and inflammation of bilateral lower extremity I89.0 Lymphedema, not elsewhere classified E11.622 Type 2 diabetes mellitus with other skin ulcer L97.812 Non-pressure chronic ulcer of other part of right lower leg with fat layer exposed S81.812A Laceration without foreign body, left lower leg, initial encounter L97.822 Non-pressure chronic ulcer of other part of left lower leg with fat layer exposed Z94.0 Kidney transplant status I10 Essential (primary) hypertension I50.42 Chronic combined systolic (congestive) and diastolic (congestive) heart failure I48.0 Paroxysmal atrial fibrillation Z79.01 Long term (current) use of  anticoagulants Facility Procedures : CPT4 Code: 04540981 Description: 99214 - WOUND CARE VISIT-LEV 4 EST PT Modifier: Quantity: 1 Physician Procedures : CPT4 Code Description Modifier 1914782 99213 - WC PHYS LEVEL 3 - EST PT ICD-10 Diagnosis Description I87.333 Chronic venous hypertension (idiopathic) with ulcer and inflammation of bilateral lower extremity I89.0 Lymphedema, not elsewhere classified  E11.622 Type 2 diabetes mellitus with other skin ulcer L97.812 Non-pressure chronic ulcer of other part of right lower leg with fat layer exposed Quantity: 1 Electronic Signature(s) Signed: 12/12/2022 1:49:29 PM By: [name] PA-C Previous Signature: 12/12/2022 1:21:40 PM Version By: [name] Entered By: [name] on 12/12/2022 13:49:29  small opening today. I think he is at the point where hopefully by next week this will be resolved. Nonetheless I do feel like that he is still having quite a bit of an issue here all things considered. Readmission: 12-05-2022 upon evaluation today patient appears to be doing poorly currently in regard to his wound. With that being said I  do believe that the patient is currently having some issues again has been little over a year since I last saw him March 2023. At this point he tells me he has 2 issues 1 where he hit his left leg on a piece of furniture which I think is the primary concern there. The other issue is that of just having blisters popping up on his right leg. He tells me he is really not wearing compression socks regularly maybe 3 times per week when I pinned him down on it is the most that he is wearing these and not even certain if that regular or not to be perfectly honest. With that being said I do believe that he would benefit from a likely the compression therapy but at the same time he has such tremendous pain in the past he is always refused this therefore I think Tubigrip is probably the best way to go at this point considering he cannot even get his compression socks on currently due to the pain. Patient does have a past medical history which is significant for chronic venous hypertension, lymphedema, diabetes mellitus type 2, he is a kidney transplant patient, has hypertension, congestive heart failure, atrial fibrillation, and is on long-term anticoagulant therapy in the form of Eliquis. 12-12-2022 upon evaluation today patient appears to be doing well with regard to his wound he does have a lot of new skin growth which is good news unfortunately however has had a lot of burning and pain with the Hydrofera Blue that also seem to be sticking and causing a lot of bleeding which I do not like. We may need to try something a little bit different here. Electronic Signature(s) Signed: 12/12/2022 1:48:14 PM By: [name] PA-C Entered By: [name] on 12/12/2022 13:48:14 [name] ([id number]) [id number] Page 3 of 9 -------------------------------------------------------------------------------- Physical Exam Details Patient Name: Date of Service: [name], [name] 12/12/2022  12:00 PM Medical Record Number: [id number] Patient Account Number: [id number] Date of Birth/Sex: Treating RN: 1961/02/12 (62 y.o. [name] Primary Care Provider: [name] Other Clinician: Referring Provider: Treating Provider/Extender: Derrek Monaco in Treatment: 1 Constitutional Well-nourished and well-hydrated in no acute distress. Respiratory normal breathing without difficulty. Psychiatric this patient is able to make decisions and demonstrates good insight into disease process. Alert and Oriented x 3. pleasant and cooperative. Notes Upon inspection patient's wound bed actually showed signs of good granulation epithelization at this point. Fortunately I do not see any signs of worsening overall and I do believe that the patient is making headway towards closure with that being said with the irritation that he is having I see no signs of infection but I do believe that he may require a different dressing to prevent sticking and causing further issues here. Electronic Signature(s) Signed: 12/12/2022 1:48:41 PM By: [name] PA-C Entered By: [name] on 12/12/2022 13:48:41 -------------------------------------------------------------------------------- Physician Orders Details Patient Name: Date of Service: [name] F. 12/12/2022 12:00 PM Medical Record Number: [id number] Patient Account Number: [id number] Date of Birth/Sex: Treating RN: [birth year] (62 y.o. [name] Primary Care Provider: [name] Other  directed Secondary Dressing: ABD Pad 5x9 (in/in) (Generic) 1 x Per Day/15 Days Discharge Instructions: Cover with ABD pad Secondary Dressing: Zetuvit Plus 4x8 (in/in) (Generic) 1 x Per Day/15 Days Secured With: Medipore T - 93M Medipore H Soft Cloth Surgical T ape ape, 2x2 (in/yd) (Generic) 1 x Per Day/15 Days Secured With: Kerlix Roll Sterile or Non-Sterile 6-ply 4.5x4 (yd/yd) (Generic) 1 x Per  Day/15 Days Discharge Instructions: Apply Kerlix as directed Secured With: Tubigrip Size D, 3x10 (in/yd) 1 x Per Day/15 Days KIERE, [place] ([id number]) [id number] Page 5 of 9 Electronic Signature(s) Signed: 12/12/2022 4:38:26 PM By: [name] Signed: 12/13/2022 9:00:01 AM By: [name] PA-C Entered By: [name] on 12/12/2022 13:20:57 -------------------------------------------------------------------------------- Problem List Details Patient Name: Date of Service: Gwendolyn Fill, Azeem F. 12/12/2022 12:00 PM Medical Record Number: [id number] Patient Account Number: [id number] Date of Birth/Sex: Treating RN: 08-10-1960 (62 y.o. [name] Primary Care Provider: [name] Other Clinician: Referring Provider: Treating Provider/Extender: Derrek Monaco in Treatment: 1 Active Problems ICD-10 Encounter Code Description Active Date MDM Diagnosis I87.333 Chronic venous hypertension (idiopathic) with ulcer and inflammation of 12/05/2022 No Yes bilateral lower extremity I89.0 Lymphedema, not elsewhere classified 12/05/2022 No Yes E11.622 Type 2 diabetes mellitus with other skin ulcer 12/05/2022 No Yes L97.812 Non-pressure chronic ulcer of other part of right lower leg with fat layer 12/05/2022 No Yes exposed S81.812A Laceration without foreign body, left lower leg, initial encounter 12/05/2022 No Yes L97.822 Non-pressure chronic ulcer of other part of left lower leg with fat layer exposed9/02/2023 No Yes Z94.0 Kidney transplant status 12/05/2022 No Yes I10 Essential (primary) hypertension 12/05/2022 No Yes I50.42 Chronic combined systolic (congestive) and diastolic (congestive) heart failure 12/05/2022 No Yes I48.0 Paroxysmal atrial fibrillation 12/05/2022 No Yes Z79.01 Long term (current) use of anticoagulants 12/05/2022 No Yes ORVA, CHRISTIE ([id number]) [id number] Page 6 of 9 Inactive  Problems Resolved Problems Electronic Signature(s) Signed: 12/12/2022 12:43:20 PM By: [name] PA-C Entered By: [name] on 12/12/2022 12:43:20 -------------------------------------------------------------------------------- Progress Note Details Patient Name: Date of Service: [place] F. 12/12/2022 12:00 PM Medical Record Number: [id number] Patient Account Number: [id number] Date of Birth/Sex: Treating RN: Nov 05, 1960 (62 y.o. [name] Primary Care Provider: [name] Other Clinician: Referring Provider: Treating Provider/Extender: Derrek Monaco in Treatment: 1 Subjective Chief Complaint Information obtained from Patient Bilateral LE Ulcers History of Present Illness (HPI) The following HPI elements were documented for the patient's wound: Location: left anterior shin and left medial calf Quality: Patient reports experiencing a dull pain to affected area(s). Severity: Patient states wound are getting worse. Duration: Patient has had the wound for > 3 months prior to seeking treatment at the wound center Timing: Pain in wound is Intermittent (comes and goes Context: The wound appeared gradually over time Modifying Factors: Other treatment(s) tried include:had surgery on the left anterior shin for a hematoma which has been nonhealing for the last 8 months Associated Signs and Symptoms: Patient reports having increase swelling. 62 year old diabetic was last hemoglobin A1c was 7.2% has been reviewed today with 2 ulcerated areas in his left lower extremity which she's had for about 8 months and the one most recently was started as a blister for 2 weeks. he has a past medical history of atrial fibrillation, coronary artery disease, CHF, chronic venous stasis dermatitis, diabetes mellitus, hypertension, sleep apnea and is status post kidney transplant in 2012. He is a former smoker and quit cigars in 1996. In the remote past  directed Secondary Dressing: ABD Pad 5x9 (in/in) (Generic) 1 x Per Day/15 Days Discharge Instructions: Cover with ABD pad Secondary Dressing: Zetuvit Plus 4x8 (in/in) (Generic) 1 x Per Day/15 Days Secured With: Medipore T - 93M Medipore H Soft Cloth Surgical T ape ape, 2x2 (in/yd) (Generic) 1 x Per Day/15 Days Secured With: Kerlix Roll Sterile or Non-Sterile 6-ply 4.5x4 (yd/yd) (Generic) 1 x Per  Day/15 Days Discharge Instructions: Apply Kerlix as directed Secured With: Tubigrip Size D, 3x10 (in/yd) 1 x Per Day/15 Days KIERE, [place] ([id number]) [id number] Page 5 of 9 Electronic Signature(s) Signed: 12/12/2022 4:38:26 PM By: [name] Signed: 12/13/2022 9:00:01 AM By: [name] PA-C Entered By: [name] on 12/12/2022 13:20:57 -------------------------------------------------------------------------------- Problem List Details Patient Name: Date of Service: Gwendolyn Fill, Azeem F. 12/12/2022 12:00 PM Medical Record Number: [id number] Patient Account Number: [id number] Date of Birth/Sex: Treating RN: 08-10-1960 (62 y.o. [name] Primary Care Provider: [name] Other Clinician: Referring Provider: Treating Provider/Extender: Derrek Monaco in Treatment: 1 Active Problems ICD-10 Encounter Code Description Active Date MDM Diagnosis I87.333 Chronic venous hypertension (idiopathic) with ulcer and inflammation of 12/05/2022 No Yes bilateral lower extremity I89.0 Lymphedema, not elsewhere classified 12/05/2022 No Yes E11.622 Type 2 diabetes mellitus with other skin ulcer 12/05/2022 No Yes L97.812 Non-pressure chronic ulcer of other part of right lower leg with fat layer 12/05/2022 No Yes exposed S81.812A Laceration without foreign body, left lower leg, initial encounter 12/05/2022 No Yes L97.822 Non-pressure chronic ulcer of other part of left lower leg with fat layer exposed9/02/2023 No Yes Z94.0 Kidney transplant status 12/05/2022 No Yes I10 Essential (primary) hypertension 12/05/2022 No Yes I50.42 Chronic combined systolic (congestive) and diastolic (congestive) heart failure 12/05/2022 No Yes I48.0 Paroxysmal atrial fibrillation 12/05/2022 No Yes Z79.01 Long term (current) use of anticoagulants 12/05/2022 No Yes ORVA, CHRISTIE ([id number]) [id number] Page 6 of 9 Inactive  Problems Resolved Problems Electronic Signature(s) Signed: 12/12/2022 12:43:20 PM By: [name] PA-C Entered By: [name] on 12/12/2022 12:43:20 -------------------------------------------------------------------------------- Progress Note Details Patient Name: Date of Service: [place] Elroy Channel F. 12/12/2022 12:00 PM Medical Record Number: [id number] Patient Account Number: [id number] Date of Birth/Sex: Treating RN: Nov 05, 1960 (62 y.o. [name] Primary Care Provider: [name] Other Clinician: Referring Provider: Treating Provider/Extender: Derrek Monaco in Treatment: 1 Subjective Chief Complaint Information obtained from Patient Bilateral LE Ulcers History of Present Illness (HPI) The following HPI elements were documented for the patient's wound: Location: left anterior shin and left medial calf Quality: Patient reports experiencing a dull pain to affected area(s). Severity: Patient states wound are getting worse. Duration: Patient has had the wound for > 3 months prior to seeking treatment at the wound center Timing: Pain in wound is Intermittent (comes and goes Context: The wound appeared gradually over time Modifying Factors: Other treatment(s) tried include:had surgery on the left anterior shin for a hematoma which has been nonhealing for the last 8 months Associated Signs and Symptoms: Patient reports having increase swelling. 62 year old diabetic was last hemoglobin A1c was 7.2% has been reviewed today with 2 ulcerated areas in his left lower extremity which she's had for about 8 months and the one most recently was started as a blister for 2 weeks. he has a past medical history of atrial fibrillation, coronary artery disease, CHF, chronic venous stasis dermatitis, diabetes mellitus, hypertension, sleep apnea and is status post kidney transplant in 2012. He is a former smoker and quit cigars in 1996. In the remote past

## 2022-12-16 ENCOUNTER — Telehealth (HOSPITAL_COMMUNITY): Payer: Self-pay | Admitting: Cardiology

## 2022-12-16 ENCOUNTER — Encounter (INDEPENDENT_AMBULATORY_CARE_PROVIDER_SITE_OTHER): Payer: Self-pay | Admitting: Nurse Practitioner

## 2022-12-16 ENCOUNTER — Ambulatory Visit (INDEPENDENT_AMBULATORY_CARE_PROVIDER_SITE_OTHER): Payer: Commercial Managed Care - PPO | Admitting: Nurse Practitioner

## 2022-12-16 VITALS — BP 160/72 | HR 75 | Resp 16 | Wt 254.0 lb

## 2022-12-16 DIAGNOSIS — M79604 Pain in right leg: Secondary | ICD-10-CM | POA: Diagnosis not present

## 2022-12-16 DIAGNOSIS — I10 Essential (primary) hypertension: Secondary | ICD-10-CM

## 2022-12-16 DIAGNOSIS — M79605 Pain in left leg: Secondary | ICD-10-CM | POA: Diagnosis not present

## 2022-12-16 DIAGNOSIS — L97822 Non-pressure chronic ulcer of other part of left lower leg with fat layer exposed: Secondary | ICD-10-CM

## 2022-12-16 NOTE — Progress Notes (Signed)
Subjective:    Patient ID: [name]., male    DOB: [birth year], 62 y.o.   MRN: [id number] Chief Complaint  Patient presents with   New Patient (Initial Visit)    Ref Alphonsus Sias consult ble pain    [name] is a 62 year old male who presents today for evaluation of leg pain.  He notes that the pain has been ongoing for the last several months.  The pain extends from his ankle area up to his mid thigh or so.  He denies any claudication-like symptoms or worse pain when he walks.  He does note that the pain is parasternal as in bed.  The pain is consistent whether he is active or at rest.  He describes it as aching throbbing sensation.  He currently has open wounds in his bilateral lower extremities that he has been working with.  In reviewing the wound care notes these do seem to be healing.    Review of Systems  Cardiovascular:  Negative for leg swelling.  All other systems reviewed and are negative.      Objective:   Physical Exam Vitals reviewed.  HENT:     Head: Normocephalic.  Cardiovascular:     Rate and Rhythm: Normal rate.     Pulses:          Dorsalis pedis pulses are 2+ on the right side and 2+ on the left side.  Pulmonary:     Effort: Pulmonary effort is normal.  Musculoskeletal:        General: Normal range of motion.     Right lower leg: 1+ Edema present.     Left lower leg: 1+ Edema present.  Skin:    General: Skin is warm and dry.  Neurological:     Mental Status: He is alert and oriented to person, place, and time.  Psychiatric:        Mood and Affect: Mood normal.        Behavior: Behavior normal.        Thought Content: Thought content normal.        Judgment: Judgment normal.     BP (!) 160/72 (BP Location: Right Arm)   Pulse 75   Resp 16   Wt 254 lb (115.2 kg)   BMI 36.45 kg/m   Past Medical History:  Diagnosis Date   Anal condyloma    Anemia    Aortic atherosclerosis (HCC)    Aortic stenosis    a.) TTE 10/2021: Mod AS. AVA  1.06cm^2 (VTI). Mean grad ; b.) TTE 04/02/2022: no AV stenosis   Asthma, persistent controlled 02/25/2013   Attention or concentration deficit 04/26/2021   BPH with obstruction/lower urinary tract symptoms 09/25/2014   CAD (coronary artery disease) 2005   a.) LHC 1996 -> HG stenosis mLAD -> PTCA/PCI with stent x 1 (unk type) -> complicated by CFA pseudoanurysm (required surg);  b.) LHC 08/10/2002  -> 95% LAD, 70% ISR LAD, 80% pOM, 70% mRCA --> CVTS consult; c.) 4v CABG 08/13/2002; c.) 03/2018 MV: Small, fixed inferoapical and apical sep defect. No isc. EF 47% (60-65% by 05/2018 TTE); d.) 02/2021 MV: small Apical inf fixed defect. No isc -> low risk.   Cardiomegaly    Cellulitis and abscess of left leg 07/22/2020   Chronic atrial fibrillation    a.) CHA2DS2VASc = 4 (CHF, HTN, vascular disease history, T2DM);  b.) rate/rhythm maintained without pharmacological intervention; chronically anticoagulated with apixaban   Chronic combined systolic and diastolic CHF (congestive  heart failure)    a.) TTE 7/18 : EF 45-50%; b.) TTE 05/2018 : EF 60-65%, RVSP 74.8; c.) TTE 12/2018: EF 50-55%. Sev dil LA; d.) TTE 04/2019: EF 45-50%; e.) TTE 07/2021: EF 40-45%, G3DD; f.) TTE 10/2021: EF 40-45%, mild conc LVH, septal-lat dyssynchrony (LBBB), mildly red RVSF, mild-mod dil LA, mildly dil RA, mild-mod MS, mod AS; g.) TTE 04/02/2022: EF 40-45%, glob HK, LVH, red RVSF, RVE, sev LAE, mild-mod RAE, mild MR   Chronic venous stasis dermatitis of both lower extremities    Cytomegaloviral disease 2017   Diverticulosis of colon 04/22/2013   Elevated RIGHT hemidiaphragm    Erectile dysfunction    a.) on topical TRT + Trimix (alprostadil/papaverine/phentolamine) injections   ESRD (end stage renal disease) (HCC)    a.) dialysis dependent 2004-2012; b.) s/p cadaveric RIGHT renal transplant 09/13/2010   Essential hypertension 12/17/2010   GERD (gastroesophageal reflux disease)    Gout    History of 2019 novel coronavirus  disease (COVID-19) 06/30/2019   History of bilateral cataract extraction 10/2017   History of renal transplant 09/13/2010   a.) s/p cadaveric donor transplant 09/13/2010   Hx of bilateral cataract extraction 10/2017   Hyperlipidemia    Hypogonadism in male 09/25/2014   Ischemic cardiomyopathy    Left cephalic vein thrombosis 06/2019   Long term current use of anticoagulant    a.) apixaban   Long term current use of immunosuppressive drug    a.) mycophenolate + prednisone + tacrolimus   Major depressive disorder 10/04/2013   Mitral stenosis 11/07/2021   a.) TTE 11/07/2021: severe MAC, mild-mod MS (MPG 6 mmHg); b.) TTE 04/02/2022: no MV stenosis   Murmur    Obesity hypoventilation syndrome 03/31/2012   OSA treated with BiPAP 09/29/2012   PAH (pulmonary artery hypertension)    a. 04/2019 RHC: RA 19, RV 80/20, PA 78/31 (51), PCWP 25, CO/CI 7.69/3.26. PVR 3.25 --> Sev PAH, likely primarily PV HTN; b.) RHC 04/04/2022: mRA 13, mPA 40, mPCWP 20, PA sat 59, AO sat 92, CO 7.63, CI 3.26, PVR 2.6 --> mod portal venous HTN   Pancreatitis    S/P CABG x 4 08/13/2002   a.) LIMA-LAD, LRA-OM, SVG-D1, SVG-dRCA   S/P gastric bypass    Synovitis of finger 06/11/2019   Trigger finger, unspecified little finger 12/23/2018   Type 2 diabetes mellitus with hyperglycemia, with long-term current use of insulin 09/09/2014   a.) has FreeStyle Libre CGM   Type 2 MI (myocardial infarction) (HCC)    Ulcer 05/2016   Left shin   Wears glasses     Social History   Socioeconomic History   Marital status: Married    Spouse name: Not on file   Number of children: 2   Years of education: 14   Highest education level: Associate degree: academic program  Occupational History   Occupation: Presenter, broadcasting: unemployed    Comment: disabled due to kidney failure  Tobacco Use   Smoking status: Former    Types: Cigars    Quit date: 09/02/1994    Years since quitting: 28.3    Passive  exposure: Never   Smokeless tobacco: Never  Vaping Use   Vaping status: Never Used  Substance and Sexual Activity   Alcohol use: Not Currently    Comment: occ   Drug use: No   Sexual activity: Not on file  Other Topics Concern   Not on file  Social History Narrative   Has living will  Wife is health care POA---son Molli Hazard is alternate   Would accept resuscitation attempts   No tube feedings if cognitively unaware   Social Determinants of Health   Financial Resource Strain: Not on file  Food Insecurity: No Food Insecurity (08/05/2022)   Hunger Vital Sign    Worried About Running Out of Food in the Last Year: Never true    Ran Out of Food in the Last Year: Never true  Transportation Needs: No Transportation Needs (08/05/2022)   PRAPARE - Administrator, Civil Service (Medical): No    Lack of Transportation (Non-Medical): No  Physical Activity: Not on file  Stress: Not on file  Social Connections: Not on file  Intimate Partner Violence: Not At Risk (08/05/2022)   Humiliation, Afraid, Rape, and Kick questionnaire    Fear of Current or Ex-Partner: No    Emotionally Abused: No    Physically Abused: No    Sexually Abused: No    Past Surgical History:  Procedure Laterality Date   CARDIAC CATHETERIZATION N/A 08/10/2002   CATARACT EXTRACTION W/PHACO Right 10/29/2017   Procedure: CATARACT EXTRACTION PHACO AND INTRAOCULAR LENS PLACEMENT (IOC)  RIGHT DIABETIC;  Surgeon: Lockie Mola, MD;  Location: [facility];  Service: Ophthalmology;  Laterality: Right   CATARACT EXTRACTION W/PHACO Left 11/18/2017   Procedure: CATARACT EXTRACTION PHACO AND INTRAOCULAR LENS PLACEMENT (IOC) LEFT IVA/TOPICAL;  Surgeon: Lockie Mola, MD;  Location: [facility];  Service: Ophthalmology;  Laterality: Left   COLONOSCOPY WITH PROPOFOL N/A 04/22/2013   Procedure: COLONOSCOPY WITH PROPOFOL;  Surgeon: Rachael Fee, MD;  Location: [facility];  Service: Endoscopy;   Laterality: N/A;   CORONARY ANGIOPLASTY WITH STENT PLACEMENT N/A 1996   CORONARY ARTERY BYPASS GRAFT N/A 08/13/2002   Procedure: 4v CORONARY ARTERY BYPASS GRAFTING; Location: [facility]; Surgeon: Katy Apo, MD   CYSTOSCOPY WITH INSERTION OF UROLIFT N/A 12/25/2021   Procedure: CYSTOSCOPY WITH INSERTION OF UROLIFT;  Surgeon: Riki Altes, MD;  Location: [facility];  Service: Urology;  Laterality: N/A;   DG ANGIO AV SHUNT*L*     right and left upper arms   FASCIOTOMY  03/03/2012   Procedure: FASCIOTOMY;  Surgeon: Nicki Reaper, MD;  Location: [facility];  Service: Orthopedics;  Laterality: Right;  FASCIOTOMY RIGHT SMALL FINGER   FASCIOTOMY Left 08/17/2013   Procedure: FASCIOTOMY LEFT RING;  Surgeon: Nicki Reaper, MD;  Location: [facility];  Service: Orthopedics;  Laterality: Left;   INCISION AND DRAINAGE ABSCESS Left 10/15/2015   Procedure: INCISION AND DRAINAGE ABSCESS;  Surgeon: Leafy Ro, MD;  Location: [facility];  Service: General;  Laterality: Left;   KIDNEY TRANSPLANT  09/13/2010   cadaver--at [facility] HEART CATH N/A 11/15/2016   Procedure: RIGHT HEART CATH;  Surgeon: Iran Ouch, MD;  Location: [facility];  Service: Cardiovascular;  Laterality: N/A;   RIGHT HEART CATH N/A 05/03/2019   Procedure: RIGHT HEART CATH;  Surgeon: Laurey Morale, MD;  Location: [facility];  Service: Cardiovascular;  Laterality: N/A;   RIGHT HEART CATH N/A 04/04/2022   Procedure: RIGHT HEART CATH;  Surgeon: Laurey Morale, MD;  Location: [facility] CV LAB;  Service: Cardiovascular;  Laterality: N/A;   ROUX-EN-Y GASTRIC BYPASS N/A 2015   TYMPANIC MEMBRANE REPAIR Left 03/2010   VASECTOMY     WART FULGURATION N/A 05/31/2022   Procedure: FULGURATION ANAL WART;  Surgeon: Sung Amabile, DO;  Location: [facility];  Service:  General;  Laterality: N/A;    Family History  Problem Relation Age of Onset   Heart disease Father    Kidney failure Father     Kidney disease Father    Diabetes Maternal Grandmother    Breast cancer Maternal Grandmother    Valvular heart disease Mother    Liver cancer Paternal Uncle    Liver cancer Paternal Grandmother    Prostate cancer Neg Hx     Allergies  Allergen Reactions   Contrast Media [Iodinated Contrast Media] Other (See Comments)    Kidney transplant   Iodine Other (See Comments)    Other reaction(s): Other (See Comments) Kidney transplant   Ibuprofen Other (See Comments)    Due to kidney transplant Due to kidney transplant Due to kidney transplant       Latest Ref Rng & Units 11/21/2022    1:00 PM 11/01/2022    3:58 PM 10/25/2022    8:47 AM  CBC  WBC 4.0 - 10.5 K/uL 5.9  7.1  4.9   Hemoglobin 13.0 - 17.0 g/dL 25.3  66.4  40.3   Hematocrit 39.0 - 52.0 % 40.1  40.4  39.7   Platelets 150 - 400 K/uL 127  113  112       CMP     Component Value Date/Time   NA 136 11/01/2022 1558   NA 143 10/21/2022 1214   NA 141 06/16/2014 0911   K 3.4 (L) 11/01/2022 1558   K 3.4 (L) 06/16/2014 0911   CL 97 (L) 11/01/2022 1558   CL 104 06/16/2014 0911   CO2 28 11/01/2022 1558   CO2 30 06/16/2014 0911   GLUCOSE 145 (H) 11/01/2022 1558   GLUCOSE 139 (H) 06/16/2014 0911   BUN 93 (H) 11/01/2022 1558   BUN 111 (HH) 10/21/2022 1214   BUN 13 06/16/2014 0911   CREATININE 2.60 (H) 11/01/2022 1558   CREATININE 0.83 06/16/2014 0911   CALCIUM 8.8 (L) 11/01/2022 1558   CALCIUM 9.1 06/16/2014 0911   PROT 8.0 08/05/2022 1139   PROT 6.3 03/07/2015 0823   PROT 6.9 06/16/2014 0911   ALBUMIN 4.0 08/05/2022 1139   ALBUMIN 3.9 03/07/2015 0823   ALBUMIN 3.6 06/16/2014 0911   AST 21 08/05/2022 1139   AST 14 (L) 06/16/2014 0911   ALT 14 08/05/2022 1139   ALT 9 (L) 06/16/2014 0911   ALKPHOS 128 (H) 08/05/2022 1139   ALKPHOS 98 06/16/2014 0911   BILITOT 0.8 08/05/2022 1139   BILITOT 1.2 03/07/2015 0823   BILITOT 1.9 (H) 06/16/2014 0911   GFR 23.63 (L) 09/18/2021 1038   EGFR 24 (L) 10/21/2022 1214    GFRNONAA 27 (L) 11/01/2022 1558   GFRNONAA >60 06/16/2014 0911     No results found.     Assessment & Plan:   1. Pain in both lower extremities The patient and patient's lower extremities have components of both arterial and venous disease.  Additionally it certainly could be related to musculoskeletal changes.  We will plan to have the patient return his convenience for bilateral ABIs and bilateral venous reflux studies.  2. Essential hypertension Continue antihypertensive medications as already ordered, these medications have been reviewed and there are no changes at this time.  3. Non-pressure chronic ulcer of other part of left lower leg with fat layer exposed (HCC) The patient has been diligently working with wound care to help with wound healing.  ABIs were also levels and evaluate perfusion for continued wound healing   Current Outpatient  Medications on File Prior to Visit  Medication Sig Dispense Refill   albuterol (PROVENTIL) (2.5 MG/3ML) 0.083% nebulizer solution Take 3 mLs (2.5 mg total) by nebulization every 6 (six) hours as needed for wheezing or shortness of breath. 150 mL 0   albuterol (VENTOLIN HFA) 108 (90 Base) MCG/ACT inhaler Inhale 2 puffs into the lungs every 6 (six) hours as needed for wheezing or shortness of breath. 6.7 g 5   amoxicillin-clavulanate (AUGMENTIN) 875-125 MG tablet Take 1 tablet by mouth 2 (two) times daily. 14 tablet 0   apixaban (ELIQUIS) 5 MG TABS tablet Take 1 tablet (5 mg total) by mouth 2 (two) times daily. 180 tablet 3   buPROPion (WELLBUTRIN SR) 150 MG 12 hr tablet Take 1 tablet (150 mg total) by mouth 2 (two) times daily. 180 tablet 3   cholecalciferol (VITAMIN D3) 25 MCG (1000 UNIT) tablet Take 1,000 Units by mouth at bedtime.     colchicine 0.6 MG tablet Take 1 tablet (0.6 mg total) by mouth every other day. 45 tablet 1   Continuous Glucose Sensor (FREESTYLE LIBRE 3 SENSOR) MISC Use one every 2 weeks. 6 each 3   cyclobenzaprine (FLEXERIL)  5 MG tablet Take by mouth.     dapagliflozin propanediol (FARXIGA) 10 MG TABS tablet Take 1 tablet (10 mg total) by mouth daily before breakfast. 90 tablet 3   febuxostat (ULORIC) 40 MG tablet Take 3 tablets (120 mg total) by mouth daily. 90 tablet 5   fluticasone (FLONASE) 50 MCG/ACT nasal spray Place 2 sprays into both nostrils daily. 16 g 11   gabapentin (NEURONTIN) 300 MG capsule Take 1 capsule (300 mg total) by mouth 2 (two) times daily. 180 capsule 0   hydrALAZINE (APRESOLINE) 25 MG tablet Take 1 tablet (25 mg total) by mouth every 8 (eight) hours. 270 tablet 3   HYDROcodone-acetaminophen (NORCO/VICODIN) 5-325 MG tablet Take 1-2 tablets by mouth every 6 (six) hours as needed for moderate pain. 40 tablet 0   hydrocortisone 2.5 % cream Apply topically 3 (three) times daily as needed. 30 g 3   insulin glargine-yfgn (SEMGLEE) 100 UNIT/ML Pen Inject 40 Units into the skin daily. 45 mL 3   insulin lispro (HUMALOG KWIKPEN) 100 UNIT/ML KwikPen Inject 10-20 Units into the skin 3 (three) times daily. Per sliding scale     Insulin Pen Needle (TECHLITE PEN NEEDLES) 31G X 5 MM MISC 4 (four) times daily. 400 each 3   Insulin Pen Needle (TECHLITE PEN NEEDLES) 32G X 6 MM MISC Use 4 times a day 100 each 3   Insulin Pen Needle (UNIFINE PENTIPS) 31G X 5 MM MISC Use 4 times daily as directed 400 each 3   iron polysaccharides (FERREX 150) 150 MG capsule Take 1 capsule (150 mg total) by mouth 2 (two) times daily. 180 capsule 0   isosorbide mononitrate (IMDUR) 60 MG 24 hr tablet Take 0.5 tablets (30 mg total) by mouth daily. 30 tablet 6   lidocaine (LIDODERM) 5 % Place 1 patch onto the skin daily.PLACE 1 PATCH ONTO THE SKIN FOR 12 (TWELVE) HOURS. REMOVE & DISCARD PATCH WITHIN 12 HOURS OR AS DIRECTED BY MD (Patient taking differently: Place 1 patch onto the skin daily as needed.) 60 patch 11   lidocaine (XYLOCAINE) 5 % ointment Apply 1 Application topically 3 (three) times daily as needed. Apply pea size amount to  area as needed for pain. 50 g 0   losartan (COZAAR) 25 MG tablet Take 1 tablet (25 mg total) by  mouth daily. 90 tablet 3   metolazone (ZAROXOLYN) 2.5 MG tablet Take 1 tablet (2.5 mg total) by mouth 2 (two) times a week.     Multiple Vitamin (MULTIVITAMIN WITH MINERALS) TABS tablet Take 1 tablet by mouth daily.     mycophenolate (MYFORTIC) 180 MG EC tablet Take 2 tablets (360 mg total) by mouth 2 (two) times daily. 360 tablet 3   omeprazole (PRILOSEC) 20 MG capsule TAKE 1 CAPSULE BY MOUTH DAILY. (Patient taking differently: Take 20 mg by mouth at bedtime.) 90 capsule 3   potassium chloride (KLOR-CON) 10 MEQ tablet Take 6 tablets (60 mEq total) by mouth every morning AND 4 tablets (40 mEq total) every evening. 300 tablet 6   predniSONE (DELTASONE) 5 MG tablet Take 1 tablet (5 mg total) by mouth daily. 90 tablet 3   rosuvastatin (CRESTOR) 10 MG tablet Take 1 tablet (10 mg total) by mouth daily. 90 tablet 3   sulfamethoxazole-trimethoprim (BACTRIM) 400-80 MG tablet Take 1 tablet by mouth 3 (three) times a week every Monday, Wednesday, and Friday. 36 tablet 3   tacrolimus (PROGRAF) 1 MG capsule Take 2 capsules (2 mg total) by mouth 2 times daily. 120 capsule 11   tamsulosin (FLOMAX) 0.4 MG CAPS capsule Take 2 capsules (0.8 mg total) by mouth daily. 60 capsule 0   Testosterone 20.25 MG/ACT (1.62%) GEL Place 2 Pump onto the skin daily. 150 g 3   torsemide (DEMADEX) 100 MG tablet Take 1 tablet (100 mg total) by mouth 2 (two) times daily. 60 tablet 5   zinc sulfate 220 (50 Zn) MG capsule Take 220 mg by mouth daily.     montelukast (SINGULAIR) 10 MG tablet TAKE 1 TABLET BY MOUTH AT BEDTIME. (Patient taking differently: Take 10 mg by mouth at bedtime.) 90 tablet 3   spironolactone (ALDACTONE) 25 MG tablet Take 1 tablet (25 mg total) by mouth daily. 90 tablet 3   No current facility-administered medications on file prior to visit.    There are no Patient Instructions on file for this visit. No follow-ups  on file.   Georgiana Spinner, NP

## 2022-12-16 NOTE — Telephone Encounter (Signed)
Pt left VM on triage line with reports of low b/p readings x 1 week Today 91/60   Returned call-reports he has appt with [facility]

## 2022-12-17 ENCOUNTER — Ambulatory Visit (HOSPITAL_BASED_OUTPATIENT_CLINIC_OR_DEPARTMENT_OTHER): Payer: Commercial Managed Care - PPO | Admitting: Cardiology

## 2022-12-17 ENCOUNTER — Encounter: Payer: Self-pay | Admitting: Cardiology

## 2022-12-17 ENCOUNTER — Encounter: Payer: Commercial Managed Care - PPO | Admitting: Physician Assistant

## 2022-12-17 ENCOUNTER — Other Ambulatory Visit
Admission: RE | Admit: 2022-12-17 | Discharge: 2022-12-17 | Disposition: A | Discharge status: Home / Self Care | Payer: Commercial Managed Care - PPO | Source: Ambulatory Visit | Attending: Cardiology | Admitting: Cardiology

## 2022-12-17 ENCOUNTER — Other Ambulatory Visit: Payer: Self-pay

## 2022-12-17 VITALS — BP 133/70 | HR 70 | Wt 257.8 lb

## 2022-12-17 DIAGNOSIS — I482 Chronic atrial fibrillation, unspecified: Secondary | ICD-10-CM | POA: Insufficient documentation

## 2022-12-17 DIAGNOSIS — I5042 Chronic combined systolic (congestive) and diastolic (congestive) heart failure: Secondary | ICD-10-CM | POA: Diagnosis not present

## 2022-12-17 DIAGNOSIS — E11622 Type 2 diabetes mellitus with other skin ulcer: Secondary | ICD-10-CM | POA: Diagnosis not present

## 2022-12-17 DIAGNOSIS — E1122 Type 2 diabetes mellitus with diabetic chronic kidney disease: Secondary | ICD-10-CM | POA: Diagnosis not present

## 2022-12-17 DIAGNOSIS — L97812 Non-pressure chronic ulcer of other part of right lower leg with fat layer exposed: Secondary | ICD-10-CM | POA: Diagnosis not present

## 2022-12-17 DIAGNOSIS — L97822 Non-pressure chronic ulcer of other part of left lower leg with fat layer exposed: Secondary | ICD-10-CM | POA: Diagnosis not present

## 2022-12-17 DIAGNOSIS — I87333 Chronic venous hypertension (idiopathic) with ulcer and inflammation of bilateral lower extremity: Secondary | ICD-10-CM | POA: Diagnosis not present

## 2022-12-17 DIAGNOSIS — I132 Hypertensive heart and chronic kidney disease with heart failure and with stage 5 chronic kidney disease, or end stage renal disease: Secondary | ICD-10-CM | POA: Diagnosis not present

## 2022-12-17 DIAGNOSIS — I890 Lymphedema, not elsewhere classified: Secondary | ICD-10-CM | POA: Diagnosis not present

## 2022-12-17 DIAGNOSIS — S81809A Unspecified open wound, unspecified lower leg, initial encounter: Secondary | ICD-10-CM | POA: Diagnosis not present

## 2022-12-17 DIAGNOSIS — Z87891 Personal history of nicotine dependence: Secondary | ICD-10-CM | POA: Diagnosis not present

## 2022-12-17 DIAGNOSIS — I10 Essential (primary) hypertension: Secondary | ICD-10-CM

## 2022-12-17 DIAGNOSIS — S81812A Laceration without foreign body, left lower leg, initial encounter: Secondary | ICD-10-CM | POA: Diagnosis not present

## 2022-12-17 DIAGNOSIS — N186 End stage renal disease: Secondary | ICD-10-CM | POA: Diagnosis not present

## 2022-12-17 DIAGNOSIS — I480 Paroxysmal atrial fibrillation: Secondary | ICD-10-CM | POA: Diagnosis not present

## 2022-12-17 LAB — COMPREHENSIVE METABOLIC PANEL
ALT: 12 U/L (ref 0–44)
AST: 16 U/L (ref 15–41)
Albumin: 3.7 g/dL (ref 3.5–5.0)
Alkaline Phosphatase: 114 U/L (ref 38–126)
Anion gap: 13 (ref 5–15)
BUN: 81 mg/dL — ABNORMAL HIGH (ref 8–23)
CO2: 26 mmol/L (ref 22–32)
Calcium: 8.6 mg/dL — ABNORMAL LOW (ref 8.9–10.3)
Chloride: 97 mmol/L — ABNORMAL LOW (ref 98–111)
Creatinine, Ser: 2.83 mg/dL — ABNORMAL HIGH (ref 0.61–1.24)
GFR, Estimated: 24 mL/min — ABNORMAL LOW (ref >60)
Glucose, Bld: 154 mg/dL — ABNORMAL HIGH (ref 70–99)
Potassium: 4.1 mmol/L (ref 3.5–5.1)
Sodium: 136 mmol/L (ref 135–145)
Total Bilirubin: 0.7 mg/dL (ref 0.3–1.2)
Total Protein: 7.2 g/dL (ref 6.5–8.1)

## 2022-12-17 LAB — BRAIN NATRIURETIC PEPTIDE: B Natriuretic Peptide: 265.4 pg/mL — ABNORMAL HIGH (ref 0.0–100.0)

## 2022-12-17 LAB — CBC
HCT: 42.2 % (ref 39.0–52.0)
Hemoglobin: 13.6 g/dL (ref 13.0–17.0)
MCH: 28.4 pg (ref 26.0–34.0)
MCHC: 32.2 g/dL (ref 30.0–36.0)
MCV: 88.1 fL (ref 80.0–100.0)
Platelets: 158 10*3/uL (ref 150–400)
RBC: 4.79 MIL/uL (ref 4.22–5.81)
RDW: 14.1 % (ref 11.5–15.5)
WBC: 6.4 10*3/uL (ref 4.0–10.5)
nRBC: 0 % (ref 0.0–0.2)

## 2022-12-17 MED ORDER — SPIRONOLACTONE 25 MG PO TABS
12.5000 mg | ORAL_TABLET | Freq: Every day | ORAL | 3 refills | Status: DC
Start: 2022-12-17 — End: 2023-02-05
  Filled 2022-12-17: qty 45, 90d supply, fill #0
  Filled 2023-01-15: qty 45, 90d supply, fill #1

## 2022-12-17 NOTE — Patient Instructions (Addendum)
-   Continue to hold Imdur & Hydralazine (DO NOT TAKE)  - Take losartan 25mg  at night.   - Decrease spironolactone 12.5mg  daily.

## 2022-12-17 NOTE — Progress Notes (Addendum)
sponges, not tissues or cotton balls. Do not scrub or use excessive force. Pat dry using gauze sponges, not tissue or cotton balls. Prim Dressing: Xeroform 5x9-HBD (in/in) (DME) (Generic) 1 x Per Day/30 Days ary Discharge Instructions: Apply Xeroform 5x9-HBD (in/in) as directed Secondary Dressing: ABD Pad 5x9 (in/in) (DME) (Generic) 1 x Per Day/30 Days Discharge Instructions: Cover with ABD  pad Secondary Dressing: Zetuvit Plus 4x8 (in/in) (Generic) 1 x Per Day/30 Days Secured With: Medipore T - 21M Medipore H Soft Cloth Surgical T ape ape, 2x2 (in/yd) (DME) (Generic) 1 x Per Day/30 Days Secured With: Kerlix Roll Sterile or Non-Sterile 6-ply 4.5x4 (yd/yd) (DME) (Generic) 1 x Per Day/30 Days Discharge Instructions: Apply Kerlix as directed Secured With: Tubigrip Size D, 3x10 (in/yd) 1 x Per Day/30 Days Electronic Signature(s) [name], [name] ([id number]) [id number] Page 5 of 9 Signed: 12/17/2022 8:28:12 AM By: [name] PA-C Signed: 12/17/2022 4:29:17 PM By: [name] Entered By: [name] on 12/17/2022 08:01:06 -------------------------------------------------------------------------------- Problem List Details Patient Name: Date of Service: Carl Jain [name]. 12/17/2022 7:30 A M Medical Record Number: [id number] Patient Account Number: [id number] Date of Birth/Sex: Treating RN: 06-21-1960 (62 y.o. [name] Primary Care Provider: [name] Other Clinician: Referring Provider: Treating Provider/Extender: Derrek Monaco in Treatment: 1 Active Problems ICD-10 Encounter Code Description Active Date MDM Diagnosis I87.333 Chronic venous hypertension (idiopathic) with ulcer and inflammation of 12/05/2022 No Yes bilateral lower extremity I89.0 Lymphedema, not elsewhere classified 12/05/2022 No Yes E11.622 Type 2 diabetes mellitus with other skin ulcer 12/05/2022 No Yes L97.812 Non-pressure chronic ulcer of other part of right lower leg with fat layer 12/05/2022 No Yes exposed S81.812A Laceration without foreign body, left lower leg, initial encounter 12/05/2022 No Yes L97.822 Non-pressure chronic ulcer of other part of left lower leg with fat layer exposed9/02/2023 No Yes Z94.0 Kidney transplant status 12/05/2022 No Yes I10 Essential (primary) hypertension 12/05/2022 No Yes I50.42 Chronic combined systolic  (congestive) and diastolic (congestive) heart failure 12/05/2022 No Yes I48.0 Paroxysmal atrial fibrillation 12/05/2022 No Yes Z79.01 Long term (current) use of anticoagulants 12/05/2022 No Yes [name], [name] ([id number]) [id number] Page 6 of 9 Inactive Problems Resolved Problems Electronic Signature(s) Signed: 12/17/2022 7:55:47 AM By: [name] PA-C Entered By: [name] on 12/17/2022 07:55:47 -------------------------------------------------------------------------------- Progress Note Details Patient Name: Date of Service: Carl Elroy Channel [name]. 12/17/2022 7:30 A M Medical Record Number: [id number] Patient Account Number: [id number] Date of Birth/Sex: Treating RN: 1961-02-16 (62 y.o. [name] Primary Care Provider: [name] Other Clinician: Referring Provider: Treating Provider/Extender: Derrek Monaco in Treatment: 1 Subjective Chief Complaint Information obtained from Patient Bilateral LE Ulcers History of Present Illness (HPI) The following HPI elements were documented for the patient's wound: Location: left anterior shin and left medial calf Quality: Patient reports experiencing a dull pain to affected area(s). Severity: Patient states wound are getting worse. Duration: Patient has had the wound for > 3 months prior to seeking treatment at the wound center Timing: Pain in wound is Intermittent (comes and goes Context: The wound appeared gradually over time Modifying Factors: Other treatment(s) tried include:had surgery on the left anterior shin for a hematoma which has been nonhealing for the last 8 months Associated Signs and Symptoms: Patient reports having increase swelling. 62 year old diabetic was last hemoglobin A1c was 7.2% has been reviewed today with 2 ulcerated areas in his left lower extremity which she's had for about 8 months and the one most recently was started as a  sponges, not tissues or cotton balls. Do not scrub or use excessive force. Pat dry using gauze sponges, not tissue or cotton balls. Prim Dressing: Xeroform 5x9-HBD (in/in) (DME) (Generic) 1 x Per Day/30 Days ary Discharge Instructions: Apply Xeroform 5x9-HBD (in/in) as directed Secondary Dressing: ABD Pad 5x9 (in/in) (DME) (Generic) 1 x Per Day/30 Days Discharge Instructions: Cover with ABD  pad Secondary Dressing: Zetuvit Plus 4x8 (in/in) (Generic) 1 x Per Day/30 Days Secured With: Medipore T - 21M Medipore H Soft Cloth Surgical T ape ape, 2x2 (in/yd) (DME) (Generic) 1 x Per Day/30 Days Secured With: Kerlix Roll Sterile or Non-Sterile 6-ply 4.5x4 (yd/yd) (DME) (Generic) 1 x Per Day/30 Days Discharge Instructions: Apply Kerlix as directed Secured With: Tubigrip Size D, 3x10 (in/yd) 1 x Per Day/30 Days Electronic Signature(s) [name], [name] ([id number]) [id number] Page 5 of 9 Signed: 12/17/2022 8:28:12 AM By: [name] PA-C Signed: 12/17/2022 4:29:17 PM By: [name] Entered By: [name] on 12/17/2022 08:01:06 -------------------------------------------------------------------------------- Problem List Details Patient Name: Date of Service: Carl Jain [name]. 12/17/2022 7:30 A M Medical Record Number: [id number] Patient Account Number: [id number] Date of Birth/Sex: Treating RN: 06-21-1960 (62 y.o. [name] Primary Care Provider: [name] Other Clinician: Referring Provider: Treating Provider/Extender: Derrek Monaco in Treatment: 1 Active Problems ICD-10 Encounter Code Description Active Date MDM Diagnosis I87.333 Chronic venous hypertension (idiopathic) with ulcer and inflammation of 12/05/2022 No Yes bilateral lower extremity I89.0 Lymphedema, not elsewhere classified 12/05/2022 No Yes E11.622 Type 2 diabetes mellitus with other skin ulcer 12/05/2022 No Yes L97.812 Non-pressure chronic ulcer of other part of right lower leg with fat layer 12/05/2022 No Yes exposed S81.812A Laceration without foreign body, left lower leg, initial encounter 12/05/2022 No Yes L97.822 Non-pressure chronic ulcer of other part of left lower leg with fat layer exposed9/02/2023 No Yes Z94.0 Kidney transplant status 12/05/2022 No Yes I10 Essential (primary) hypertension 12/05/2022 No Yes I50.42 Chronic combined systolic  (congestive) and diastolic (congestive) heart failure 12/05/2022 No Yes I48.0 Paroxysmal atrial fibrillation 12/05/2022 No Yes Z79.01 Long term (current) use of anticoagulants 12/05/2022 No Yes [name], [name] ([id number]) [id number] Page 6 of 9 Inactive Problems Resolved Problems Electronic Signature(s) Signed: 12/17/2022 7:55:47 AM By: [name] PA-C Entered By: [name] on 12/17/2022 07:55:47 -------------------------------------------------------------------------------- Progress Note Details Patient Name: Date of Service: Carl Elroy Channel [name]. 12/17/2022 7:30 A M Medical Record Number: [id number] Patient Account Number: [id number] Date of Birth/Sex: Treating RN: 1961-02-16 (62 y.o. [name] Primary Care Provider: [name] Other Clinician: Referring Provider: Treating Provider/Extender: Derrek Monaco in Treatment: 1 Subjective Chief Complaint Information obtained from Patient Bilateral LE Ulcers History of Present Illness (HPI) The following HPI elements were documented for the patient's wound: Location: left anterior shin and left medial calf Quality: Patient reports experiencing a dull pain to affected area(s). Severity: Patient states wound are getting worse. Duration: Patient has had the wound for > 3 months prior to seeking treatment at the wound center Timing: Pain in wound is Intermittent (comes and goes Context: The wound appeared gradually over time Modifying Factors: Other treatment(s) tried include:had surgery on the left anterior shin for a hematoma which has been nonhealing for the last 8 months Associated Signs and Symptoms: Patient reports having increase swelling. 62 year old diabetic was last hemoglobin A1c was 7.2% has been reviewed today with 2 ulcerated areas in his left lower extremity which she's had for about 8 months and the one most recently was started as a  has a small opening today. I think he is at the point where hopefully by next week this will be resolved. Nonetheless I do feel like that he is still having quite a bit of an issue here all things considered. Readmission: 12-05-2022 upon evaluation today patient appears to be doing poorly currently in regard to his wound. With that being said I  do believe that the patient is currently having some issues again has been little over a year since I last saw him March 2023. At this point he tells me he has 2 issues 1 where he hit his left leg on a piece of furniture which I think is the primary concern there. The other issue is that of just having blisters popping up on his right leg. He tells me he is really not wearing compression socks regularly maybe 3 times per week when I pinned him down on it is the most that he is wearing these and not even certain if that regular or not to be perfectly honest. With that being said I do believe that he would benefit from a likely the compression therapy but at the same time he has such tremendous pain in the past he is always refused this therefore I think Tubigrip is probably the best way to go at this point considering he cannot even get his compression socks on currently due to the pain. Patient does have a past medical history which is significant for chronic venous hypertension, lymphedema, diabetes mellitus type 2, he is a kidney transplant patient, has hypertension, congestive heart failure, atrial fibrillation, and is on long-term anticoagulant therapy in the form of Eliquis. 12-12-2022 upon evaluation today patient appears to be doing well with regard to his wound he does have a lot of new skin growth which is good news unfortunately however has had a lot of burning and pain with the Hydrofera Blue that also seem to be sticking and causing a lot of bleeding which I do not like. We may need to try something a little bit different here. 12-17-2022 upon evaluation today patient appears to be doing well currently with regard to his legs. He is actually showing much less Cinoman bleeding and overall seems to be doing much better compared to where he was previous. Fortunately I do not see any evidence of active infection locally or systemically which is great news. No fevers, chills, nausea, vomiting, or  diarrhea. Electronic Signature(s) Signed: 12/17/2022 8:16:53 AM By: [name] PA-C Entered By: [name] on 12/17/2022 08:16:53 [name] ([id number]) [id number] Page 3 of 9 -------------------------------------------------------------------------------- Physical Exam Details Patient Name: Date of Service: [name], [name] 12/17/2022 7:30 A M Medical Record Number: [id number] Patient Account Number: [id number] Date of Birth/Sex: Treating RN: 12/31/1960 (62 y.o. [name] Primary Care Provider: [name] Other Clinician: Referring Provider: Treating Provider/Extender: Derrek Monaco in Treatment: 1 Constitutional Well-nourished and well-hydrated in no acute distress. Respiratory normal breathing without difficulty. Psychiatric this patient is able to make decisions and demonstrates good insight into disease process. Alert and Oriented x 3. pleasant and cooperative. Notes Upon inspection patient's wound bed actually showed signs of good granulation and epithelization at this point. Fortunately I do not see any signs of worsening overall and I do believe that the patient is making headway towards complete closure which is great news. Electronic Signature(s) Signed: 12/17/2022 8:17:09 AM By: [name] PA-C Entered By: [name] on 12/17/2022 08:17:09 -------------------------------------------------------------------------------- Physician Orders Details Patient Name:  sponges, not tissues or cotton balls. Do not scrub or use excessive force. Pat dry using gauze sponges, not tissue or cotton balls. Prim Dressing: Xeroform 5x9-HBD (in/in) (DME) (Generic) 1 x Per Day/30 Days ary Discharge Instructions: Apply Xeroform 5x9-HBD (in/in) as directed Secondary Dressing: ABD Pad 5x9 (in/in) (DME) (Generic) 1 x Per Day/30 Days Discharge Instructions: Cover with ABD  pad Secondary Dressing: Zetuvit Plus 4x8 (in/in) (Generic) 1 x Per Day/30 Days Secured With: Medipore T - 21M Medipore H Soft Cloth Surgical T ape ape, 2x2 (in/yd) (DME) (Generic) 1 x Per Day/30 Days Secured With: Kerlix Roll Sterile or Non-Sterile 6-ply 4.5x4 (yd/yd) (DME) (Generic) 1 x Per Day/30 Days Discharge Instructions: Apply Kerlix as directed Secured With: Tubigrip Size D, 3x10 (in/yd) 1 x Per Day/30 Days Electronic Signature(s) [name], [name] ([id number]) [id number] Page 5 of 9 Signed: 12/17/2022 8:28:12 AM By: [name] PA-C Signed: 12/17/2022 4:29:17 PM By: [name] Entered By: [name] on 12/17/2022 08:01:06 -------------------------------------------------------------------------------- Problem List Details Patient Name: Date of Service: Carl Jain [name]. 12/17/2022 7:30 A M Medical Record Number: [id number] Patient Account Number: [id number] Date of Birth/Sex: Treating RN: 06-21-1960 (62 y.o. [name] Primary Care Provider: [name] Other Clinician: Referring Provider: Treating Provider/Extender: Derrek Monaco in Treatment: 1 Active Problems ICD-10 Encounter Code Description Active Date MDM Diagnosis I87.333 Chronic venous hypertension (idiopathic) with ulcer and inflammation of 12/05/2022 No Yes bilateral lower extremity I89.0 Lymphedema, not elsewhere classified 12/05/2022 No Yes E11.622 Type 2 diabetes mellitus with other skin ulcer 12/05/2022 No Yes L97.812 Non-pressure chronic ulcer of other part of right lower leg with fat layer 12/05/2022 No Yes exposed S81.812A Laceration without foreign body, left lower leg, initial encounter 12/05/2022 No Yes L97.822 Non-pressure chronic ulcer of other part of left lower leg with fat layer exposed9/02/2023 No Yes Z94.0 Kidney transplant status 12/05/2022 No Yes I10 Essential (primary) hypertension 12/05/2022 No Yes I50.42 Chronic combined systolic  (congestive) and diastolic (congestive) heart failure 12/05/2022 No Yes I48.0 Paroxysmal atrial fibrillation 12/05/2022 No Yes Z79.01 Long term (current) use of anticoagulants 12/05/2022 No Yes [name], [name] ([id number]) [id number] Page 6 of 9 Inactive Problems Resolved Problems Electronic Signature(s) Signed: 12/17/2022 7:55:47 AM By: [name] PA-C Entered By: [name] on 12/17/2022 07:55:47 -------------------------------------------------------------------------------- Progress Note Details Patient Name: Date of Service: Carl Elroy Channel [name]. 12/17/2022 7:30 A M Medical Record Number: [id number] Patient Account Number: [id number] Date of Birth/Sex: Treating RN: 1961-02-16 (62 y.o. [name] Primary Care Provider: [name] Other Clinician: Referring Provider: Treating Provider/Extender: Derrek Monaco in Treatment: 1 Subjective Chief Complaint Information obtained from Patient Bilateral LE Ulcers History of Present Illness (HPI) The following HPI elements were documented for the patient's wound: Location: left anterior shin and left medial calf Quality: Patient reports experiencing a dull pain to affected area(s). Severity: Patient states wound are getting worse. Duration: Patient has had the wound for > 3 months prior to seeking treatment at the wound center Timing: Pain in wound is Intermittent (comes and goes Context: The wound appeared gradually over time Modifying Factors: Other treatment(s) tried include:had surgery on the left anterior shin for a hematoma which has been nonhealing for the last 8 months Associated Signs and Symptoms: Patient reports having increase swelling. 62 year old diabetic was last hemoglobin A1c was 7.2% has been reviewed today with 2 ulcerated areas in his left lower extremity which she's had for about 8 months and the one most recently was started as a  has a small opening today. I think he is at the point where hopefully by next week this will be resolved. Nonetheless I do feel like that he is still having quite a bit of an issue here all things considered. Readmission: 12-05-2022 upon evaluation today patient appears to be doing poorly currently in regard to his wound. With that being said I  do believe that the patient is currently having some issues again has been little over a year since I last saw him March 2023. At this point he tells me he has 2 issues 1 where he hit his left leg on a piece of furniture which I think is the primary concern there. The other issue is that of just having blisters popping up on his right leg. He tells me he is really not wearing compression socks regularly maybe 3 times per week when I pinned him down on it is the most that he is wearing these and not even certain if that regular or not to be perfectly honest. With that being said I do believe that he would benefit from a likely the compression therapy but at the same time he has such tremendous pain in the past he is always refused this therefore I think Tubigrip is probably the best way to go at this point considering he cannot even get his compression socks on currently due to the pain. Patient does have a past medical history which is significant for chronic venous hypertension, lymphedema, diabetes mellitus type 2, he is a kidney transplant patient, has hypertension, congestive heart failure, atrial fibrillation, and is on long-term anticoagulant therapy in the form of Eliquis. 12-12-2022 upon evaluation today patient appears to be doing well with regard to his wound he does have a lot of new skin growth which is good news unfortunately however has had a lot of burning and pain with the Hydrofera Blue that also seem to be sticking and causing a lot of bleeding which I do not like. We may need to try something a little bit different here. 12-17-2022 upon evaluation today patient appears to be doing well currently with regard to his legs. He is actually showing much less Cinoman bleeding and overall seems to be doing much better compared to where he was previous. Fortunately I do not see any evidence of active infection locally or systemically which is great news. No fevers, chills, nausea, vomiting, or  diarrhea. Electronic Signature(s) Signed: 12/17/2022 8:16:53 AM By: [name] PA-C Entered By: [name] on 12/17/2022 08:16:53 [name] ([id number]) [id number] Page 3 of 9 -------------------------------------------------------------------------------- Physical Exam Details Patient Name: Date of Service: [name], [name] 12/17/2022 7:30 A M Medical Record Number: [id number] Patient Account Number: [id number] Date of Birth/Sex: Treating RN: 12/31/1960 (62 y.o. [name] Primary Care Provider: [name] Other Clinician: Referring Provider: Treating Provider/Extender: Derrek Monaco in Treatment: 1 Constitutional Well-nourished and well-hydrated in no acute distress. Respiratory normal breathing without difficulty. Psychiatric this patient is able to make decisions and demonstrates good insight into disease process. Alert and Oriented x 3. pleasant and cooperative. Notes Upon inspection patient's wound bed actually showed signs of good granulation and epithelization at this point. Fortunately I do not see any signs of worsening overall and I do believe that the patient is making headway towards complete closure which is great news. Electronic Signature(s) Signed: 12/17/2022 8:17:09 AM By: [name] PA-C Entered By: [name] on 12/17/2022 08:17:09 -------------------------------------------------------------------------------- Physician Orders Details Patient Name:  Date of Service: [name], [name] 12/17/2022 7:30 A M Medical Record Number: [id number] Patient Account Number: [id number] Date of Birth/Sex: Treating RN: [birth year] (62 y.o. [name] Primary Care Provider: [name] Other Clinician: Referring Provider: Treating Provider/Extender: Derrek Monaco in Treatment: 1 Verbal / Phone Orders: No Diagnosis Coding ICD-10 Coding Code Description [phone] Chronic venous  hypertension (idiopathic) with ulcer and inflammation of bilateral lower extremity I89.0 Lymphedema, not elsewhere classified E11.622 Type 2 diabetes mellitus with other skin ulcer L97.812 Non-pressure chronic ulcer of other part of right lower leg with fat layer exposed S81.812A Laceration without foreign body, left lower leg, initial encounter L97.822 Non-pressure chronic ulcer of other part of left lower leg with fat layer exposed Z94.0 Kidney transplant status [name], [name] ([id number]) [id number] Page 4 of 9 I10 Essential (primary) hypertension I50.42 Chronic combined systolic (congestive) and diastolic (congestive) heart failure I48.0 Paroxysmal atrial fibrillation Z79.01 Long term (current) use of anticoagulants Follow-up Appointments Return Appointment in 1 week. Bathing/ Shower/ Hygiene May shower; gently cleanse wound with antibacterial soap, rinse and pat dry prior to dressing wounds No tub bath. Anesthetic (Use 'Patient Medications' Section for Anesthetic Order Entry) Lidocaine applied to wound bed Edema Control - Lymphedema / Segmental Compressive Device / Other Tubigrip single layer applied. - bilaterally size D Elevate, Exercise Daily and A void Standing for Long Periods of Time. Elevate leg(s) parallel to the floor when sitting. DO YOUR BEST to sleep in the bed at night. DO NOT sleep in your recliner. Long hours of sitting in a recliner leads to swelling of the legs and/or potential wounds on your backside. Wound Treatment Wound #7 - Lower Leg Wound Laterality: Right Cleanser: [employer]ram Ancillary Kit - 15 Day Supply (Generic) 1 x Per Day/30 Days Discharge Instructions: Use supplies as instructed; Kit contains: (15) Saline Bullets; (15) 3x3 Gauze; 15 pr Gloves Cleanser: Soap and Water 1 x Per Day/30 Days Discharge Instructions: Gently cleanse wound with antibacterial soap, rinse and pat dry prior to dressing wounds Cleanser: Wound Cleanser 1 x  Per Day/30 Days Discharge Instructions: Wash your hands with soap and water. Remove old dressing, discard into plastic bag and place into trash. Cleanse the wound with Wound Cleanser prior to applying a clean dressing using gauze sponges, not tissues or cotton balls. Do not scrub or use excessive force. Pat dry using gauze sponges, not tissue or cotton balls. Prim Dressing: Xeroform-HBD 2x2 (in/in) (DME) (Generic) 1 x Per Day/30 Days ary Discharge Instructions: Apply Xeroform-HBD 2x2 (in/in) as directed Secondary Dressing: ABD Pad 5x9 (in/in) (DME) (Generic) 1 x Per Day/30 Days Discharge Instructions: Cover with ABD pad Secondary Dressing: Zetuvit Plus 4x4 (in/in) (Generic) 1 x Per Day/30 Days Secured With: Medipore T - 79M Medipore H Soft Cloth Surgical T ape ape, 2x2 (in/yd) (DME) (Generic) 1 x Per Day/30 Days Secured With: Kerlix Roll Sterile or Non-Sterile 6-ply 4.5x4 (yd/yd) (DME) (Generic) 1 x Per Day/30 Days Discharge Instructions: Apply Kerlix as directed Secured With: Tubigrip Size D, 3x10 (in/yd) 1 x Per Day/30 Days Wound #8 - Lower Leg Wound Laterality: Left, Circumferential Cleanser: Soap and Water 1 x Per Day/30 Days Discharge Instructions: Gently cleanse wound with antibacterial soap, rinse and pat dry prior to dressing wounds Cleanser: Wound Cleanser 1 x Per Day/30 Days Discharge Instructions: Wash your hands with soap and water. Remove old dressing, discard into plastic bag and place into trash. Cleanse the wound with Wound Cleanser prior to applying a clean dressing using gauze  has a small opening today. I think he is at the point where hopefully by next week this will be resolved. Nonetheless I do feel like that he is still having quite a bit of an issue here all things considered. Readmission: 12-05-2022 upon evaluation today patient appears to be doing poorly currently in regard to his wound. With that being said I  do believe that the patient is currently having some issues again has been little over a year since I last saw him March 2023. At this point he tells me he has 2 issues 1 where he hit his left leg on a piece of furniture which I think is the primary concern there. The other issue is that of just having blisters popping up on his right leg. He tells me he is really not wearing compression socks regularly maybe 3 times per week when I pinned him down on it is the most that he is wearing these and not even certain if that regular or not to be perfectly honest. With that being said I do believe that he would benefit from a likely the compression therapy but at the same time he has such tremendous pain in the past he is always refused this therefore I think Tubigrip is probably the best way to go at this point considering he cannot even get his compression socks on currently due to the pain. Patient does have a past medical history which is significant for chronic venous hypertension, lymphedema, diabetes mellitus type 2, he is a kidney transplant patient, has hypertension, congestive heart failure, atrial fibrillation, and is on long-term anticoagulant therapy in the form of Eliquis. 12-12-2022 upon evaluation today patient appears to be doing well with regard to his wound he does have a lot of new skin growth which is good news unfortunately however has had a lot of burning and pain with the Hydrofera Blue that also seem to be sticking and causing a lot of bleeding which I do not like. We may need to try something a little bit different here. 12-17-2022 upon evaluation today patient appears to be doing well currently with regard to his legs. He is actually showing much less Cinoman bleeding and overall seems to be doing much better compared to where he was previous. Fortunately I do not see any evidence of active infection locally or systemically which is great news. No fevers, chills, nausea, vomiting, or  diarrhea. Electronic Signature(s) Signed: 12/17/2022 8:16:53 AM By: [name] PA-C Entered By: [name] on 12/17/2022 08:16:53 [name] ([id number]) [id number] Page 3 of 9 -------------------------------------------------------------------------------- Physical Exam Details Patient Name: Date of Service: [name], [name] 12/17/2022 7:30 A M Medical Record Number: [id number] Patient Account Number: [id number] Date of Birth/Sex: Treating RN: 12/31/1960 (62 y.o. [name] Primary Care Provider: [name] Other Clinician: Referring Provider: Treating Provider/Extender: Derrek Monaco in Treatment: 1 Constitutional Well-nourished and well-hydrated in no acute distress. Respiratory normal breathing without difficulty. Psychiatric this patient is able to make decisions and demonstrates good insight into disease process. Alert and Oriented x 3. pleasant and cooperative. Notes Upon inspection patient's wound bed actually showed signs of good granulation and epithelization at this point. Fortunately I do not see any signs of worsening overall and I do believe that the patient is making headway towards complete closure which is great news. Electronic Signature(s) Signed: 12/17/2022 8:17:09 AM By: [name] PA-C Entered By: [name] on 12/17/2022 08:17:09 -------------------------------------------------------------------------------- Physician Orders Details Patient Name:  sponges, not tissues or cotton balls. Do not scrub or use excessive force. Pat dry using gauze sponges, not tissue or cotton balls. Prim Dressing: Xeroform 5x9-HBD (in/in) (DME) (Generic) 1 x Per Day/30 Days ary Discharge Instructions: Apply Xeroform 5x9-HBD (in/in) as directed Secondary Dressing: ABD Pad 5x9 (in/in) (DME) (Generic) 1 x Per Day/30 Days Discharge Instructions: Cover with ABD  pad Secondary Dressing: Zetuvit Plus 4x8 (in/in) (Generic) 1 x Per Day/30 Days Secured With: Medipore T - 21M Medipore H Soft Cloth Surgical T ape ape, 2x2 (in/yd) (DME) (Generic) 1 x Per Day/30 Days Secured With: Kerlix Roll Sterile or Non-Sterile 6-ply 4.5x4 (yd/yd) (DME) (Generic) 1 x Per Day/30 Days Discharge Instructions: Apply Kerlix as directed Secured With: Tubigrip Size D, 3x10 (in/yd) 1 x Per Day/30 Days Electronic Signature(s) [name], [name] ([id number]) [id number] Page 5 of 9 Signed: 12/17/2022 8:28:12 AM By: [name] PA-C Signed: 12/17/2022 4:29:17 PM By: [name] Entered By: [name] on 12/17/2022 08:01:06 -------------------------------------------------------------------------------- Problem List Details Patient Name: Date of Service: Carl Jain [name]. 12/17/2022 7:30 A M Medical Record Number: [id number] Patient Account Number: [id number] Date of Birth/Sex: Treating RN: 06-21-1960 (62 y.o. [name] Primary Care Provider: [name] Other Clinician: Referring Provider: Treating Provider/Extender: Derrek Monaco in Treatment: 1 Active Problems ICD-10 Encounter Code Description Active Date MDM Diagnosis I87.333 Chronic venous hypertension (idiopathic) with ulcer and inflammation of 12/05/2022 No Yes bilateral lower extremity I89.0 Lymphedema, not elsewhere classified 12/05/2022 No Yes E11.622 Type 2 diabetes mellitus with other skin ulcer 12/05/2022 No Yes L97.812 Non-pressure chronic ulcer of other part of right lower leg with fat layer 12/05/2022 No Yes exposed S81.812A Laceration without foreign body, left lower leg, initial encounter 12/05/2022 No Yes L97.822 Non-pressure chronic ulcer of other part of left lower leg with fat layer exposed9/02/2023 No Yes Z94.0 Kidney transplant status 12/05/2022 No Yes I10 Essential (primary) hypertension 12/05/2022 No Yes I50.42 Chronic combined systolic  (congestive) and diastolic (congestive) heart failure 12/05/2022 No Yes I48.0 Paroxysmal atrial fibrillation 12/05/2022 No Yes Z79.01 Long term (current) use of anticoagulants 12/05/2022 No Yes [name], [name] ([id number]) [id number] Page 6 of 9 Inactive Problems Resolved Problems Electronic Signature(s) Signed: 12/17/2022 7:55:47 AM By: [name] PA-C Entered By: [name] on 12/17/2022 07:55:47 -------------------------------------------------------------------------------- Progress Note Details Patient Name: Date of Service: Carl Elroy Channel [name]. 12/17/2022 7:30 A M Medical Record Number: [id number] Patient Account Number: [id number] Date of Birth/Sex: Treating RN: 1961-02-16 (62 y.o. [name] Primary Care Provider: [name] Other Clinician: Referring Provider: Treating Provider/Extender: Derrek Monaco in Treatment: 1 Subjective Chief Complaint Information obtained from Patient Bilateral LE Ulcers History of Present Illness (HPI) The following HPI elements were documented for the patient's wound: Location: left anterior shin and left medial calf Quality: Patient reports experiencing a dull pain to affected area(s). Severity: Patient states wound are getting worse. Duration: Patient has had the wound for > 3 months prior to seeking treatment at the wound center Timing: Pain in wound is Intermittent (comes and goes Context: The wound appeared gradually over time Modifying Factors: Other treatment(s) tried include:had surgery on the left anterior shin for a hematoma which has been nonhealing for the last 8 months Associated Signs and Symptoms: Patient reports having increase swelling. 62 year old diabetic was last hemoglobin A1c was 7.2% has been reviewed today with 2 ulcerated areas in his left lower extremity which she's had for about 8 months and the one most recently was started as a  Date of Service: [name], [name] 12/17/2022 7:30 A M Medical Record Number: [id number] Patient Account Number: [id number] Date of Birth/Sex: Treating RN: [birth year] (62 y.o. [name] Primary Care Provider: [name] Other Clinician: Referring Provider: Treating Provider/Extender: Derrek Monaco in Treatment: 1 Verbal / Phone Orders: No Diagnosis Coding ICD-10 Coding Code Description [phone] Chronic venous  hypertension (idiopathic) with ulcer and inflammation of bilateral lower extremity I89.0 Lymphedema, not elsewhere classified E11.622 Type 2 diabetes mellitus with other skin ulcer L97.812 Non-pressure chronic ulcer of other part of right lower leg with fat layer exposed S81.812A Laceration without foreign body, left lower leg, initial encounter L97.822 Non-pressure chronic ulcer of other part of left lower leg with fat layer exposed Z94.0 Kidney transplant status [name], [name] ([id number]) [id number] Page 4 of 9 I10 Essential (primary) hypertension I50.42 Chronic combined systolic (congestive) and diastolic (congestive) heart failure I48.0 Paroxysmal atrial fibrillation Z79.01 Long term (current) use of anticoagulants Follow-up Appointments Return Appointment in 1 week. Bathing/ Shower/ Hygiene May shower; gently cleanse wound with antibacterial soap, rinse and pat dry prior to dressing wounds No tub bath. Anesthetic (Use 'Patient Medications' Section for Anesthetic Order Entry) Lidocaine applied to wound bed Edema Control - Lymphedema / Segmental Compressive Device / Other Tubigrip single layer applied. - bilaterally size D Elevate, Exercise Daily and A void Standing for Long Periods of Time. Elevate leg(s) parallel to the floor when sitting. DO YOUR BEST to sleep in the bed at night. DO NOT sleep in your recliner. Long hours of sitting in a recliner leads to swelling of the legs and/or potential wounds on your backside. Wound Treatment Wound #7 - Lower Leg Wound Laterality: Right Cleanser: [place]ram Ancillary Kit - 15 Day Supply (Generic) 1 x Per Day/30 Days Discharge Instructions: Use supplies as instructed; Kit contains: (15) Saline Bullets; (15) 3x3 Gauze; 15 pr Gloves Cleanser: Soap and Water 1 x Per Day/30 Days Discharge Instructions: Gently cleanse wound with antibacterial soap, rinse and pat dry prior to dressing wounds Cleanser: Wound Cleanser 1 x  Per Day/30 Days Discharge Instructions: Wash your hands with soap and water. Remove old dressing, discard into plastic bag and place into trash. Cleanse the wound with Wound Cleanser prior to applying a clean dressing using gauze sponges, not tissues or cotton balls. Do not scrub or use excessive force. Pat dry using gauze sponges, not tissue or cotton balls. Prim Dressing: Xeroform-HBD 2x2 (in/in) (DME) (Generic) 1 x Per Day/30 Days ary Discharge Instructions: Apply Xeroform-HBD 2x2 (in/in) as directed Secondary Dressing: ABD Pad 5x9 (in/in) (DME) (Generic) 1 x Per Day/30 Days Discharge Instructions: Cover with ABD pad Secondary Dressing: Zetuvit Plus 4x4 (in/in) (Generic) 1 x Per Day/30 Days Secured With: Medipore T - 79M Medipore H Soft Cloth Surgical T ape ape, 2x2 (in/yd) (DME) (Generic) 1 x Per Day/30 Days Secured With: Kerlix Roll Sterile or Non-Sterile 6-ply 4.5x4 (yd/yd) (DME) (Generic) 1 x Per Day/30 Days Discharge Instructions: Apply Kerlix as directed Secured With: Tubigrip Size D, 3x10 (in/yd) 1 x Per Day/30 Days Wound #8 - Lower Leg Wound Laterality: Left, Circumferential Cleanser: Soap and Water 1 x Per Day/30 Days Discharge Instructions: Gently cleanse wound with antibacterial soap, rinse and pat dry prior to dressing wounds Cleanser: Wound Cleanser 1 x Per Day/30 Days Discharge Instructions: Wash your hands with soap and water. Remove old dressing, discard into plastic bag and place into trash. Cleanse the wound with Wound Cleanser prior to applying a clean dressing using gauze  sponges, not tissues or cotton balls. Do not scrub or use excessive force. Pat dry using gauze sponges, not tissue or cotton balls. Prim Dressing: Xeroform 5x9-HBD (in/in) (DME) (Generic) 1 x Per Day/30 Days ary Discharge Instructions: Apply Xeroform 5x9-HBD (in/in) as directed Secondary Dressing: ABD Pad 5x9 (in/in) (DME) (Generic) 1 x Per Day/30 Days Discharge Instructions: Cover with ABD  pad Secondary Dressing: Zetuvit Plus 4x8 (in/in) (Generic) 1 x Per Day/30 Days Secured With: Medipore T - 21M Medipore H Soft Cloth Surgical T ape ape, 2x2 (in/yd) (DME) (Generic) 1 x Per Day/30 Days Secured With: Kerlix Roll Sterile or Non-Sterile 6-ply 4.5x4 (yd/yd) (DME) (Generic) 1 x Per Day/30 Days Discharge Instructions: Apply Kerlix as directed Secured With: Tubigrip Size D, 3x10 (in/yd) 1 x Per Day/30 Days Electronic Signature(s) [name], [name] ([id number]) [id number] Page 5 of 9 Signed: 12/17/2022 8:28:12 AM By: [name] PA-C Signed: 12/17/2022 4:29:17 PM By: [name] Entered By: [name] on 12/17/2022 08:01:06 -------------------------------------------------------------------------------- Problem List Details Patient Name: Date of Service: Carl Jain [name]. 12/17/2022 7:30 A M Medical Record Number: [id number] Patient Account Number: [id number] Date of Birth/Sex: Treating RN: 06-21-1960 (62 y.o. [name] Primary Care Provider: [name] Other Clinician: Referring Provider: Treating Provider/Extender: Derrek Monaco in Treatment: 1 Active Problems ICD-10 Encounter Code Description Active Date MDM Diagnosis I87.333 Chronic venous hypertension (idiopathic) with ulcer and inflammation of 12/05/2022 No Yes bilateral lower extremity I89.0 Lymphedema, not elsewhere classified 12/05/2022 No Yes E11.622 Type 2 diabetes mellitus with other skin ulcer 12/05/2022 No Yes L97.812 Non-pressure chronic ulcer of other part of right lower leg with fat layer 12/05/2022 No Yes exposed S81.812A Laceration without foreign body, left lower leg, initial encounter 12/05/2022 No Yes L97.822 Non-pressure chronic ulcer of other part of left lower leg with fat layer exposed9/02/2023 No Yes Z94.0 Kidney transplant status 12/05/2022 No Yes I10 Essential (primary) hypertension 12/05/2022 No Yes I50.42 Chronic combined systolic  (congestive) and diastolic (congestive) heart failure 12/05/2022 No Yes I48.0 Paroxysmal atrial fibrillation 12/05/2022 No Yes Z79.01 Long term (current) use of anticoagulants 12/05/2022 No Yes [name], [name] ([id number]) [id number] Page 6 of 9 Inactive Problems Resolved Problems Electronic Signature(s) Signed: 12/17/2022 7:55:47 AM By: [name] PA-C Entered By: [name] on 12/17/2022 07:55:47 -------------------------------------------------------------------------------- Progress Note Details Patient Name: Date of Service: Carl Elroy Channel [name]. 12/17/2022 7:30 A M Medical Record Number: [id number] Patient Account Number: [id number] Date of Birth/Sex: Treating RN: 1961-02-16 (62 y.o. [name] Primary Care Provider: [name] Other Clinician: Referring Provider: Treating Provider/Extender: Derrek Monaco in Treatment: 1 Subjective Chief Complaint Information obtained from Patient Bilateral LE Ulcers History of Present Illness (HPI) The following HPI elements were documented for the patient's wound: Location: left anterior shin and left medial calf Quality: Patient reports experiencing a dull pain to affected area(s). Severity: Patient states wound are getting worse. Duration: Patient has had the wound for > 3 months prior to seeking treatment at the wound center Timing: Pain in wound is Intermittent (comes and goes Context: The wound appeared gradually over time Modifying Factors: Other treatment(s) tried include:had surgery on the left anterior shin for a hematoma which has been nonhealing for the last 8 months Associated Signs and Symptoms: Patient reports having increase swelling. 62 year old diabetic was last hemoglobin A1c was 7.2% has been reviewed today with 2 ulcerated areas in his left lower extremity which she's had for about 8 months and the one most recently was started as a

## 2022-12-17 NOTE — Progress Notes (Addendum)
Reds completed.

## 2022-12-17 NOTE — Progress Notes (Signed)
   12/17/22 1427  ReDS Vest / Clip  Station Marker D  Ruler Value 40  ReDS Value Range (!) > 40  ReDS Actual Value 44

## 2022-12-17 NOTE — Progress Notes (Signed)
Instruction: Cover with ABD pad Zetuvit Plus 4x4 (in/in) Secured With Medipore T - 15M Medipore H Soft Cloth Surgical T ape ape, 2x2 (in/yd) Kerlix Roll Sterile or Non-Sterile 6-ply 4.5x4 (yd/yd) Discharge Instruction: Apply Kerlix as directed Tubigrip Size D, 3x10 (in/yd) Compression Wrap Compression Stockings Add-Ons Electronic Signature(s) Signed: 12/17/2022 4:29:17 PM By: [name] Entered By: [name] on 12/17/2022 07:48:25 -------------------------------------------------------------------------------- Wound Assessment Details Patient Name: Date of Service: Carl Jain F. 12/17/2022 7:30 A M Medical Record Number: [id number] Patient Account Number: [id number] Date of Birth/Sex: Treating RN: 1960-09-22 (62 y.o. [name] Primary Care Karry Barrilleaux: [name] Other Clinician: Referring Delayne Sanzo: Treating Ashanti Littles/Extender: Derrek Monaco in Treatment: 1 Wound Status DASHIELL, CHEVERE F ([id number]) [id number] Page 10 of 11 Wound Number: 8 Primary Skin T ear Etiology: Wound Location: Left, Circumferential Lower Leg Wound Open Wounding Event: Skin Tear/Laceration Status: Date Acquired: 11/30/2022 Comorbid Cataracts, Anemia, Lymphedema, Asthma, Sleep Apnea, Weeks Of Treatment: 1 History: Arrhythmia, Congestive Heart Failure, Coronary Artery Disease, Clustered Wound: No Hypertension, Type II Diabetes, End Stage Renal Disease, Gout, Osteoarthritis, Neuropathy Photos Wound  Measurements Length: (cm) 11 Width: (cm) 10.3 Depth: (cm) 0.1 Area: (cm) 88.986 Volume: (cm) 8.899 % Reduction in Area: 32.1% % Reduction in Volume: 32.1% Epithelialization: Small (1-33%) Tunneling: No Undermining: No Wound Description Classification: Full Thickness Without Exposed Support Structures Exudate Amount: Medium Exudate Type: Serosanguineous Exudate Color: red, brown Foul Odor After Cleansing: No Slough/Fibrino Yes Wound Bed Granulation Amount: Large (67-100%) Exposed Structure Granulation Quality: Red, Pink, Friable Fat Layer (Subcutaneous Tissue) Exposed: Yes Necrotic Amount: Small (1-33%) Necrotic Quality: Adherent Slough Treatment Notes Wound #8 (Lower Leg) Wound Laterality: Left, Circumferential Cleanser Soap and Water Discharge Instruction: Gently cleanse wound with antibacterial soap, rinse and pat dry prior to dressing wounds Wound Cleanser Discharge Instruction: Wash your hands with soap and water. Remove old dressing, discard into plastic bag and place into trash. Cleanse the wound with Wound Cleanser prior to applying a clean dressing using gauze sponges, not tissues or cotton balls. Do not scrub or use excessive force. Pat dry using gauze sponges, not tissue or cotton balls. Peri-Wound Care Topical Primary Dressing Xeroform 5x9-HBD (in/in) Discharge Instruction: Apply Xeroform 5x9-HBD (in/in) as directed Secondary Dressing ABD Pad 5x9 (in/in) Discharge Instruction: Cover with ABD pad Zetuvit Plus 4x8 (in/in) Secured With Medipore T - 15M Medipore H Soft Cloth Surgical T ape ape, 2x2 (in/yd) Kerlix Roll Sterile or Non-Sterile 6-ply 4.5x4 (yd/yd) Discharge Instruction: Apply Kerlix as directed Tubigrip Size D, 3x10 (in/yd) Compression Wrap Compression Stockings ABHIJOT, ARMACOST F ([id number]) [id number] Page 11 of 11 Add-Ons Electronic Signature(s) Signed: 12/17/2022 4:29:17 PM By: [name] Entered By: [name] on 12/17/2022 07:48:53 -------------------------------------------------------------------------------- Vitals Details Patient Name: Date of Service: Carl Jain F. 12/17/2022 7:30 A M Medical Record Number: [id number] Patient Account Number: [id number] Date of Birth/Sex: Treating RN: 1960/04/11 (62 y.o. [name] Primary Care Kately Graffam: [name] Other Clinician: Referring Derika Eckles: Treating Oswaldo Cueto/Extender: Derrek Monaco in Treatment: 1 Vital Signs Time Taken: 07:36 Temperature (F): 97.7 Height (in): 70 Pulse (bpm): 76 Weight (lbs): 253 Respiratory Rate (breaths/min): 18 Body Mass Index (BMI): 36.3 Blood Pressure (mmHg): 149/65 Reference Range: 80 - 120 mg / dl Electronic Signature(s) Signed: 12/17/2022 4:29:17 PM By: [name] Entered By: [name] on 12/17/2022 07:36:25  ISSAM, COMMINGS F ([id number]) [id number] Page 1 of 11 Visit Report for 12/17/2022 Arrival Information Details Patient Name: Date of Service: [place]HE[place], WINKELER 12/17/2022 7:30 A M Medical Record Number: [id number] Patient Account Number: [id number] Date of Birth/Sex: Treating RN: [birth year] (62 y.o. [name] Primary Care Shine Mikes: [name] Other Clinician: Referring Nyella Eckels: Treating Styles Fambro/Extender: Derrek Monaco in Treatment: 1 Visit Information History Since Last Visit Added or deleted any medications: No Patient Arrived: Ambulatory Any new allergies or adverse reactions: No Arrival Time: 07:31 Had a fall or experienced change in No Accompanied By: self activities of daily living that may affect Transfer Assistance: None risk of falls: Patient Identification Verified: Yes Hospitalized since last visit: No Secondary Verification Process Completed: Yes Has Dressing in Place as Prescribed: Yes Patient Has Alerts: Yes Has Compression in Place as Prescribed: Yes Patient Alerts: Patient on Blood Thinner Pain Present Now: No type 2 diabetic FISTULA LUE Electronic Signature(s) Signed: 12/17/2022 4:29:17 PM By: [name] Entered By: [name] on 12/17/2022 07:36:03 -------------------------------------------------------------------------------- Clinic Level of Care Assessment Details Patient Name: Date of Service: THAO, DAFFERN 12/17/2022 7:30 A M Medical Record Number: [id number] Patient Account Number: [id number] Date of Birth/Sex: Treating RN: January 14, 1961 (62 y.o. [name] Primary Care Linkoln Alkire: [name] Other Clinician: Referring Hoa Briggs: Treating Rastus Borton/Extender: Derrek Monaco in Treatment: 1 Clinic Level of Care Assessment Items TOOL 4 Quantity Score []  - 0 Use when only an EandM is performed on FOLLOW-UP visit ASSESSMENTS - Nursing Assessment /  Reassessment X- 1 10 Reassessment of Co-morbidities (includes updates in patient status) X- 1 5 Reassessment of Adherence to Treatment Plan ASSESSMENTS - Wound and Skin A ssessment / Reassessment []  - 0 Simple Wound Assessment / Reassessment - one wound ISHAQ, BATIS ([id number]) [id number] Page 2 of 11 X- 2 5 Complex Wound Assessment / Reassessment - multiple wounds []  - 0 Dermatologic / Skin Assessment (not related to wound area) ASSESSMENTS - Focused Assessment X- 1 5 Circumferential Edema Measurements - multi extremities []  - 0 Nutritional Assessment / Counseling / Intervention []  - 0 Lower Extremity Assessment (monofilament, tuning fork, pulses) []  - 0 Peripheral Arterial Disease Assessment (using hand held doppler) ASSESSMENTS - Ostomy and/or Continence Assessment and Care []  - 0 Incontinence Assessment and Management []  - 0 Ostomy Care Assessment and Management (repouching, etc.) PROCESS - Coordination of Care X - Simple Patient / Family Education for ongoing care 1 15 []  - 0 Complex (extensive) Patient / Family Education for ongoing care X- 1 10 Staff obtains Chiropractor, Records, T Results / Process Orders est []  - 0 Staff telephones HHA, Nursing Homes / Clarify orders / etc []  - 0 Routine Transfer to another Facility (non-emergent condition) []  - 0 Routine Hospital Admission (non-emergent condition) []  - 0 New Admissions / Manufacturing engineer / Ordering NPWT Apligraf, etc. , []  - 0 Emergency Hospital Admission (emergent condition) X- 1 10 Simple Discharge Coordination []  - 0 Complex (extensive) Discharge Coordination PROCESS - Special Needs []  - 0 Pediatric / Minor Patient Management []  - 0 Isolation Patient Management []  - 0 Hearing / Language / Visual special needs []  - 0 Assessment of Community assistance (transportation, D/C planning, etc.) []  - 0 Additional assistance / Altered mentation []  - 0 Support  Surface(s) Assessment (bed, cushion, seat, etc.) INTERVENTIONS - Wound Cleansing / Measurement []  - 0 Simple Wound Cleansing - one wound X- 2 5 Complex Wound Cleansing - multiple wounds X- 1 5  Circumferential Lower Leg N/A Wound Location: Blister Skin Tear/Laceration N/A Wounding Event: Diabetic Wound/Ulcer of the Lower Skin Tear N/A Primary Etiology: Extremity Cataracts, Anemia, Lymphedema, Cataracts, Anemia, Lymphedema, N/A Comorbid History: Asthma, Sleep Apnea, Arrhythmia, Asthma, Sleep Apnea, Arrhythmia, Congestive Heart Failure, Coronary Congestive Heart Failure, Coronary Artery Disease, Hypertension, Type IIArtery Disease, Hypertension, Type II Diabetes, End Stage Renal Disease, Diabetes, End Stage Renal Disease, Gout, Osteoarthritis, Neuropathy Gout, Osteoarthritis, Neuropathy 11/21/2022 11/30/2022 N/A Date Acquired: 1 1 N/A Weeks of Treatment: Open Open N/A Wound Status: No No N/A Wound Recurrence: 0.7x1.5x0.1 11x10.3x0.1 N/A Measurements L x W x D (cm) 0.825 88.986 N/A A (cm) : rea 0.082 8.899 N/A Volume (cm) : 83.60% 32.10% N/A % Reduction in Area: 83.70% 32.10% N/A % Reduction in Volume: Grade 2 Full Thickness Without Exposed N/A Classification: Support Structures Medium Medium N/A Exudate A mount: Serosanguineous Serosanguineous N/A Exudate Type: red, brown red, brown N/A Exudate Color: Large (67-100%)  Large (67-100%) N/A Granulation A mount: Red, Friable Red, Pink, Friable N/A Granulation Quality: Small (1-33%) Small (1-33%) N/A Necrotic A mount: Fat Layer (Subcutaneous Tissue): Yes Fat Layer (Subcutaneous Tissue): Yes N/A Exposed Structures: Small (1-33%) Small (1-33%) N/A Epithelialization: Treatment Notes Electronic Signature(s) Signed: 12/17/2022 7:54:46 AM By: [name] Entered By: [name] on 12/17/2022 07:54:46 -------------------------------------------------------------------------------- Multi-Disciplinary Care Plan Details Patient Name: Date of Service: Carl Jain F. 12/17/2022 7:30 A M Medical Record Number: [id number] Patient Account Number: [id number] Date of Birth/Sex: Treating RN: Jan 15, 1961 (62 y.o. [name] Primary Care Lessly Stigler: [name] Other Clinician: Referring Shaquandra Galano: Treating Thurman Sarver/Extender: Derrek Monaco in Treatment: 1 Active Inactive Necrotic Tissue Nursing Diagnoses: Impaired tissue integrity related to necrotic/devitalized tissue Knowledge deficit related to management of necrotic/devitalized tissue Goals: Necrotic/devitalized tissue will be minimized in the wound bed MASANOBU, COLAS F ([id number]) [id number].pdf Page 6 of 11 Date Initiated: 12/05/2022 Target Resolution Date: 01/30/2023 Goal Status: Active Patient/caregiver will verbalize understanding of reason and process for debridement of necrotic tissue Date Initiated: 12/05/2022 Date Inactivated: 12/05/2022 Target Resolution Date: 12/05/2022 Goal Status: Met Interventions: Assess patient pain level pre-, during and post procedure and prior to discharge Provide education on necrotic tissue and debridement process Treatment Activities: Apply topical anesthetic as ordered : 12/05/2022 Notes: Pain, Acute or Chronic Nursing Diagnoses: Pain, acute or chronic: actual or potential Goals: Patient will verbalize  adequate pain control and receive pain control interventions during procedures as needed Date Initiated: 12/05/2022 Target Resolution Date: 01/02/2023 Goal Status: Active Patient/caregiver will verbalize adequate pain control between visits Date Initiated: 12/05/2022 Target Resolution Date: 01/02/2023 Goal Status: Active Patient/caregiver will verbalize comfort level met Date Initiated: 12/05/2022 Target Resolution Date: 01/02/2023 Goal Status: Active Interventions: Complete pain assessment as per visit requirements Provide education on pain management Provision of support: recognize patient pain, provide comfort and support as needed Reposition patient for comfort Notes: Wound/Skin Impairment Nursing Diagnoses: Impaired tissue integrity Knowledge deficit related to ulceration/compromised skin integrity Goals: Ulcer/skin breakdown will have a volume reduction of 30% by week 4 Date Initiated: 12/05/2022 Target Resolution Date: 01/02/2023 Goal Status: Active Ulcer/skin breakdown will have a volume reduction of 50% by week 8 Date Initiated: 12/05/2022 Target Resolution Date: 01/30/2023 Goal Status: Active Ulcer/skin breakdown will have a volume reduction of 80% by week 12 Date Initiated: 12/05/2022 Target Resolution Date: 02/27/2023 Goal Status: Active Ulcer/skin breakdown will heal within 14 weeks Date Initiated: 12/05/2022 Target Resolution Date: 03/13/2023 Goal Status: Active Interventions: Assess patient/caregiver ability to obtain necessary supplies Assess patient/caregiver ability to perform  Wound Imaging (photographs - any number of wounds) []  - 0 Wound Tracing (instead of photographs) []  - 0 Simple Wound Measurement - one wound X- 2 5 Complex Wound Measurement - multiple wounds INTERVENTIONS - Wound Dressings X - Small Wound Dressing one or multiple wounds 2 10 []  - 0 Medium Wound Dressing one or multiple wounds []  - 0 Large Wound Dressing one or multiple wounds X- 1 5 Application of Medications - topical []  - 0 Application of Medications - injection INTERVENTIONS - Miscellaneous []  - 0 External ear exam []  - 0 Specimen Collection (cultures, biopsies, blood, body fluids, etc.) []  - 0 Specimen(s) / Culture(s) sent or taken to Lab for analysis TRENDELL, LEAHY ([id number]) [phone].pdf Page 3 of 11 []  - 0 Patient Transfer (multiple staff / Nurse, adult / Similar devices) []  - 0 Simple Staple / Suture removal (25 or less) []  - 0 Complex Staple / Suture removal (26 or more) []  - 0 Hypo / Hyperglycemic Management (close monitor of Blood Glucose) []  - 0 Ankle / Brachial Index (ABI) - do not check if billed separately X- 1 5 Vital Signs Has the patient been seen at the hospital within the last three years: Yes Total Score: 120 Level Of Care: New/Established - Level 4 Electronic Signature(s) Signed: 12/17/2022 4:29:17 PM By: [name] Entered By: [name] on 12/17/2022 08:13:35 -------------------------------------------------------------------------------- Encounter Discharge Information Details Patient Name: Date of Service: Carl Jain F. 12/17/2022 7:30 A M Medical Record Number: [id number] Patient Account Number: [id number] Date of Birth/Sex: Treating RN: 04-10-1960 (62 y.o. [name] Primary Care Norberta Stobaugh: [name] Other Clinician: Referring Besan Ketchem: Treating Simmie Garin/Extender: Derrek Monaco in Treatment: 1 Encounter Discharge Information Items Discharge Condition: Stable Ambulatory Status: Ambulatory Discharge Destination: Home Transportation: Private Auto Accompanied By: self Schedule Follow-up Appointment: Yes Clinical Summary of Care: Electronic Signature(s) Signed: 12/17/2022 8:14:11 AM By: [name] Entered By: [name] on 12/17/2022 08:14:10 -------------------------------------------------------------------------------- Lower Extremity Assessment Details Patient Name: Date of Service: [name], COUGHRAN 12/17/2022 7:30 A M Medical Record Number: [id number] Patient Account Number: [id number] Date of Birth/Sex: Treating RN: 1960-09-15 (62 y.o. [name] Primary Care Debarah Mccumbers: [name] Other Clinician: CADENCE, MCLAMB ([id number]) [id number] Page 4 of 11 Referring Lisel Siegrist: Treating Charmika Macdonnell/Extender: Derrek Monaco in Treatment: 1 Edema Assessment Assessed: [Left: No] [Right: No] Edema: [Left: No] [Right: No] Calf Left: Right: Point of Measurement: 34 cm From Medial Instep 38 cm 38 cm Ankle Left: Right: Point of Measurement: 12 cm From Medial Instep 22.5 cm 23 cm Vascular Assessment Pulses: Dorsalis Pedis Palpable: [Left:Yes] [Right:Yes] Extremity colors, hair growth, and conditions: Extremity Color: [Left:Hyperpigmented] [Right:Hyperpigmented] Hair Growth on Extremity: [Left:No] [Right:No] Temperature of Extremity: [Left:Warm < 3 seconds] [Right:Warm < 3 seconds] Toe Nail Assessment Left: Right: Thick: No No Discolored: No No Deformed: No No Improper Length and Hygiene: No No Electronic Signature(s) Signed: 12/17/2022 4:29:17 PM By: [name] Entered By: [name] on 12/17/2022  07:42:51 -------------------------------------------------------------------------------- Multi Wound Chart Details Patient Name: Date of Service: Carl Jain F. 12/17/2022 7:30 A M Medical Record Number: [id number] Patient Account Number: [id number] Date of Birth/Sex: Treating RN: May 28, 1960 (62 y.o. [name] Primary Care [place] Mosco: [name] Other Clinician: Referring Daniela Siebers: Treating Jonan Seufert/Extender: Derrek Monaco in Treatment: 1 Vital Signs Height(in): 70 Pulse(bpm): 76 Weight(lbs): 253 Blood Pressure(mmHg): 149/65 Body Mass Index(BMI): 36.3 Temperature(F): 97.7 Respiratory Rate(breaths/min): 18 [7:Photos:] [N/A:N/A [id number] Page 5 of 11] Right Lower Leg Left,  Circumferential Lower Leg N/A Wound Location: Blister Skin Tear/Laceration N/A Wounding Event: Diabetic Wound/Ulcer of the Lower Skin Tear N/A Primary Etiology: Extremity Cataracts, Anemia, Lymphedema, Cataracts, Anemia, Lymphedema, N/A Comorbid History: Asthma, Sleep Apnea, Arrhythmia, Asthma, Sleep Apnea, Arrhythmia, Congestive Heart Failure, Coronary Congestive Heart Failure, Coronary Artery Disease, Hypertension, Type IIArtery Disease, Hypertension, Type II Diabetes, End Stage Renal Disease, Diabetes, End Stage Renal Disease, Gout, Osteoarthritis, Neuropathy Gout, Osteoarthritis, Neuropathy 11/21/2022 11/30/2022 N/A Date Acquired: 1 1 N/A Weeks of Treatment: Open Open N/A Wound Status: No No N/A Wound Recurrence: 0.7x1.5x0.1 11x10.3x0.1 N/A Measurements L x W x D (cm) 0.825 88.986 N/A A (cm) : rea 0.082 8.899 N/A Volume (cm) : 83.60% 32.10% N/A % Reduction in Area: 83.70% 32.10% N/A % Reduction in Volume: Grade 2 Full Thickness Without Exposed N/A Classification: Support Structures Medium Medium N/A Exudate A mount: Serosanguineous Serosanguineous N/A Exudate Type: red, brown red, brown N/A Exudate Color: Large (67-100%)  Large (67-100%) N/A Granulation A mount: Red, Friable Red, Pink, Friable N/A Granulation Quality: Small (1-33%) Small (1-33%) N/A Necrotic A mount: Fat Layer (Subcutaneous Tissue): Yes Fat Layer (Subcutaneous Tissue): Yes N/A Exposed Structures: Small (1-33%) Small (1-33%) N/A Epithelialization: Treatment Notes Electronic Signature(s) Signed: 12/17/2022 7:54:46 AM By: [name] Entered By: [name] on 12/17/2022 07:54:46 -------------------------------------------------------------------------------- Multi-Disciplinary Care Plan Details Patient Name: Date of Service: Carl Jain F. 12/17/2022 7:30 A M Medical Record Number: [id number] Patient Account Number: [id number] Date of Birth/Sex: Treating RN: Jan 15, 1961 (62 y.o. [name] Primary Care Lessly Stigler: [name] Other Clinician: Referring Shaquandra Galano: Treating Thurman Sarver/Extender: Derrek Monaco in Treatment: 1 Active Inactive Necrotic Tissue Nursing Diagnoses: Impaired tissue integrity related to necrotic/devitalized tissue Knowledge deficit related to management of necrotic/devitalized tissue Goals: Necrotic/devitalized tissue will be minimized in the wound bed MASANOBU, COLAS F ([id number]) [id number].pdf Page 6 of 11 Date Initiated: 12/05/2022 Target Resolution Date: 01/30/2023 Goal Status: Active Patient/caregiver will verbalize understanding of reason and process for debridement of necrotic tissue Date Initiated: 12/05/2022 Date Inactivated: 12/05/2022 Target Resolution Date: 12/05/2022 Goal Status: Met Interventions: Assess patient pain level pre-, during and post procedure and prior to discharge Provide education on necrotic tissue and debridement process Treatment Activities: Apply topical anesthetic as ordered : 12/05/2022 Notes: Pain, Acute or Chronic Nursing Diagnoses: Pain, acute or chronic: actual or potential Goals: Patient will verbalize  adequate pain control and receive pain control interventions during procedures as needed Date Initiated: 12/05/2022 Target Resolution Date: 01/02/2023 Goal Status: Active Patient/caregiver will verbalize adequate pain control between visits Date Initiated: 12/05/2022 Target Resolution Date: 01/02/2023 Goal Status: Active Patient/caregiver will verbalize comfort level met Date Initiated: 12/05/2022 Target Resolution Date: 01/02/2023 Goal Status: Active Interventions: Complete pain assessment as per visit requirements Provide education on pain management Provision of support: recognize patient pain, provide comfort and support as needed Reposition patient for comfort Notes: Wound/Skin Impairment Nursing Diagnoses: Impaired tissue integrity Knowledge deficit related to ulceration/compromised skin integrity Goals: Ulcer/skin breakdown will have a volume reduction of 30% by week 4 Date Initiated: 12/05/2022 Target Resolution Date: 01/02/2023 Goal Status: Active Ulcer/skin breakdown will have a volume reduction of 50% by week 8 Date Initiated: 12/05/2022 Target Resolution Date: 01/30/2023 Goal Status: Active Ulcer/skin breakdown will have a volume reduction of 80% by week 12 Date Initiated: 12/05/2022 Target Resolution Date: 02/27/2023 Goal Status: Active Ulcer/skin breakdown will heal within 14 weeks Date Initiated: 12/05/2022 Target Resolution Date: 03/13/2023 Goal Status: Active Interventions: Assess patient/caregiver ability to obtain necessary supplies Assess patient/caregiver ability to perform

## 2022-12-17 NOTE — Progress Notes (Signed)
Advanced Heart Failure Clinic Note   Date:  12/17/2022   ID:  [name]., DOB 19-Jun-1960, MRN [id number]   Provider location: [address], [place] Type of Visit:  Established patient  PCP:  [name], MD  Cardiologist:  Dr. Kirke Corin Nephrology: Dr. Yetta [name]  HF Cardiologist: Dr. Shirlee Latch  HPI: [name]. is a 62 y.o. male  with a history of CAD s/p CABG 2005, chronic atrial fibrillation on Eliquis, ESRD s/p renal transplant 2012, anc chronic diastolic CHF.  His renal function has been fairly stable, most recent creatinine 1.43.  He has been struggling with volume overload/CHF.  He has a history of ischemic cardiomyopathy with EF as low as 35-40% in the past but echo in 3/20 showed EF 60-65%.  There is evidence for RV failure with pulmonary hypertension by echo.    Admitted 11/05/18 low grade fever and shortness of breath. HS Trop negative. Covid 19 negative. Blood CX negative. Placed on antibiotics and discharged to home the next day.   Admitted 05/03/19 with A/C Diastolic HF. Diuresed with IV lasix and transitioned to torsemide 80/40 mg . Discharge weight 252 pounds.   Admitted 06/2019 with left arm pain. Left upper extremity venous Doppler revealed occluded cephalic vein at the AV fistula outflow tract in the antecubital fossa with no other thrombus identified. Vascular evaluated. He continued on Eliquis.  No plan for intervention. Also treated for acute gout flare. Discharged on prednisone taper.     Given IV lasix on 12/21 by HF Paramedicine.   Admitted to [facility] 2/22 with acute gout flare vs cellulitis. Nephrology was consulted to evaluate transplant regimen and follow along. He was treated with antibiotics and prednisone. Discharged on decreased torsemide dose of 40 mg daily. Weight 259 lbs.  Admitted 4/22 for left leg cellulitis after sustaining a dog scratch, received IV antibiotics. No changes to his diuretic therapy. Discharge weight 266  lbs.  Follow up with Dr. Kirke Corin, 1/23, he had CP and underwent Lexiscan myoview, which overall was a low risk study with evidence of prior infarct in the infero-apical area with no significant change from before. Losartan added to GDMT regimen.  Follow up 5/23, volume overloaded, REDs 40%. Advised taking additional metolazone (3 doses that week), however labs showed worsening renal function and advised to keep at 2 doses.  Admitted in 5/23 with acute on chronic CHF and AKI.  Wonda Cerise and losartan stopped. Diuresed with IV lasix. Echo showed EF 40-45%, RV mildly reduced, GIIIDD (restrictive), IVC dilated. Nephrology consulted for diuretic dosing. Discharged home, weight 266 lbs.  He had chest pain in 8/23 and was admitted at [facility] minimal HS-TnI elevation with no trend, thought to be demand ischemia from volume overload.    Echo 8/23 showing EF 40-45%, diffuse hypokinesis with dyssynchrony, mildly dilated RV with mildly decreased systolic function, IVC dilated, moderate AS with mean gradient 15, mild-moderate mitral stenosis with mean gradient 6.   Follow up 03/27/22, volume overloaded with 25 lbs weight gain. Instructed to use Furoscix x 3 days, then resume home dose of torsemide + metolazone. Clinic weight 277 lbs.  Admitted from [facility] 04/01/22 with a/c CHF. Echo showed EF 40-45%, RV moderately reduced and RVSP 49 mmHg. RHC showed elevated filling pressures, moderate primarily pulmonary venous hypertension and preserved CO. Tadalafil stopped with primarily PH group 2 and 3. He was diuresed with IV lasix and metolazone, and GDMT added back. Overall down 19 lbs. He was  discharged home, weight 255 lbs.  Patient was admitted in 3/24 with Serratia bacteremia/pyelonephritis, treated with ertapenem.  He also had campylobacter diarrhea.  In 4/24, he tripped getting off his riding lawnmower and fell, fracturing ribs. He had a pulmonary contusion with this.   Today he presents for an acute care visit. He  reports having low blood pressures after meals for the past 1 week (90 systolic). He has been holding his hydralazine & imdur for 1 week now. This weekend after a meal at a [employer] he had a near syncopal episode prompting him to come to the clinic today. Otherwise, he has been doing very well. His weights have been stable at home. No recent illness, diarrhea, SOB.   ECG (personally reviewed): atrial fibrillation.  Labs (7/19): LDL 49 Labs (2/20): K 3.7, creatinine 1.43 Labs (4/20): K 4.3, creatinine 1.24 Labs (11/06/18): K 3.7 creatinine 1.23  Labs (05/07/19): K 3.8 Creatinine 1.5  Labs (07/18/19): K 4.3 Creatinine 1.42  Labs (5/21): K 3.3, creatinine 1.21 Labs (11/21): K 3.6, creatinine 1.4 Labs (12/21): K 3.3, creatinine 2.4, hgb 12.8 Labs (04/01/19): K 3.3 Creatinine 2.2  Labs (8/22): K 4.0, creatinine 1.6, HDL 35, LDL 36, a1c 7.5 Labs (2/23): LDL 31, HDL 36 Labs (4/23): K 3.3, creatinine 1.68 Labs (5/23): K 4.1, creatinine 2.24 Labs (8/23): K 3.5, creatinine 2.36 Labs (12/23): K 3.6, creatinine 1.99, hgb 9.3 Labs (1/24): K 4.6, creatinine 2.44 Labs (2/24): K 3.4, creatinine 2.75, LDL 25 Labs (4/24): K 4.1, creatinine 2.46 Labs (5/24): K 2.7, creatinine 2.25, IgG monoclonal protein on myeloma panel.  Labs (6/24): K 3.8, creatinine 2.58  PMH: 1. CAD: s/p CABG x 4 in [place] in 2005.  - Cardiolite (1/20): EF 47%, small fixed apical septal defect, no ischemia.  Low risk study.  - Cardiolite (12/22): small apical inferior defect, no changes from prior, no ischemia. Low risk study. 2. Asthma 3. Gout 4. Atrial fibrillation: Chronic.  5. ESRD s/p renal transplantation at [facility] in 2012.  6. Anemia of chronic disease 7. Type II diabetes 8. HTN 9. Hyperlipidemia 10. OSA: On bipap.  11. Ischemic cardiomyopathy: EF 35-40% in past.  - Cardiolite (1/20) with EF 47%.  - Echo (1/20): EF 50-55%, mild MR, RV moderately dilated with moderately decreased systolic function, PASP 73  mmHg, moderate TR.   - Echo (3/20): EF 60-65%, PASP 75 mmHg, mildly dilated RV with mildly decreased systolic function.  - Echo (2/21): EF 45-50%, global HK, moderately dilated RV with moderately decreased systolic function, D-shaped interventricular septum, dilated IVC.  - RHC (2/21): mean RA 19, PA 78/31 mean 51, mean PCWP 26, CI 3.26, PVR 3.25 - Echo (5/23): EF 40-45%, RV mildly reduced, GIIIDD (restrictive) - Echo (8/23): EF 40-45%, diffuse hypokinesis with dyssynchrony, mildly dilated RV with mildly decreased systolic function, IVC dilated, moderate AS with mean gradient 15, mild-moderate mitral stenosis with mean gradient 6.  - Echo (1/24): Echo showed EF 40-45%, RV moderately reduced and RVSP 49 mmHg, mild MR, no AS, dilated IVC.  - RHC (1/24): RA mean 13, PA 62/24 (mean 40), PCWP mean 20, CO/CI (Fick) 7.63/3.26, PVR 2.6 WU, PAPi 2.9 12. Obesity: s/p gastric bypass in 2015.  13. Bradycardia with beta blocker.  14. Aortic stenosis: Moderate on 8/23 echo.  15. Mitral stenosis: Mild to moderate on 8/23 echo.  16. Rheumatoid arthritis 17. IgG monoclonal gammopathy  Past Surgical History:  Procedure Laterality Date   CARDIAC CATHETERIZATION N/A 08/10/2002   CATARACT EXTRACTION W/PHACO Right  10/29/2017   Procedure: CATARACT EXTRACTION PHACO AND INTRAOCULAR LENS PLACEMENT (IOC)  RIGHT DIABETIC;  Surgeon: Lockie Mola, MD;  Location: [facility];  Service: Ophthalmology;  Laterality: Right   CATARACT EXTRACTION W/PHACO Left 11/18/2017   Procedure: CATARACT EXTRACTION PHACO AND INTRAOCULAR LENS PLACEMENT (IOC) LEFT IVA/TOPICAL;  Surgeon: Lockie Mola, MD;  Location: [facility];  Service: Ophthalmology;  Laterality: Left   COLONOSCOPY WITH PROPOFOL N/A 04/22/2013   Procedure: COLONOSCOPY WITH PROPOFOL;  Surgeon: Rachael Fee, MD;  Location: [facility];  Service: Endoscopy;  Laterality: N/A;   CORONARY ANGIOPLASTY WITH STENT PLACEMENT N/A 1996   CORONARY  ARTERY BYPASS GRAFT N/A 08/13/2002   Procedure: 4v CORONARY ARTERY BYPASS GRAFTING; Location: [facility]; Surgeon: Katy Apo, MD   CYSTOSCOPY WITH INSERTION OF UROLIFT N/A 12/25/2021   Procedure: CYSTOSCOPY WITH INSERTION OF UROLIFT;  Surgeon: Riki Altes, MD;  Location: [facility];  Service: Urology;  Laterality: N/A;   DG ANGIO AV SHUNT*L*     right and left upper arms   FASCIOTOMY  03/03/2012   Procedure: FASCIOTOMY;  Surgeon: Nicki Reaper, MD;  Location: [facility];  Service: Orthopedics;  Laterality: Right;  FASCIOTOMY RIGHT SMALL FINGER   FASCIOTOMY Left 08/17/2013   Procedure: FASCIOTOMY LEFT RING;  Surgeon: Nicki Reaper, MD;  Location:  SURGERY CENTER;  Service: Orthopedics;  Laterality: Left;   INCISION AND DRAINAGE ABSCESS Left 10/15/2015   Procedure: INCISION AND DRAINAGE ABSCESS;  Surgeon: Leafy Ro, MD;  Location: [facility];  Service: General;  Laterality: Left;   KIDNEY TRANSPLANT  09/13/2010   cadaver--at [facility] HEART CATH N/A 11/15/2016   Procedure: RIGHT HEART CATH;  Surgeon: Iran Ouch, MD;  Location: [facility] INVASIVE CV LAB;  Service: Cardiovascular;  Laterality: N/A;   RIGHT HEART CATH N/A 05/03/2019   Procedure: RIGHT HEART CATH;  Surgeon: Laurey Morale, MD;  Location: [facility] INVASIVE CV LAB;  Service: Cardiovascular;  Laterality: N/A;   RIGHT HEART CATH N/A 04/04/2022   Procedure: RIGHT HEART CATH;  Surgeon: Laurey Morale, MD;  Location: [facility] INVASIVE CV LAB;  Service: Cardiovascular;  Laterality: N/A;   ROUX-EN-Y GASTRIC BYPASS N/A 2015   TYMPANIC MEMBRANE REPAIR Left 03/2010   VASECTOMY     WART FULGURATION N/A 05/31/2022   Procedure: FULGURATION ANAL WART;  Surgeon: Sung Amabile, DO;  Location: [facility] OR[facility];  Service: General;  Laterality: N/A;   Current Outpatient Medications  Medication Sig Dispense Refill   albuterol (PROVENTIL) (2.5 MG/3ML) 0.083% nebulizer solution Take 3 mLs (2.5 mg total) by nebulization every 6  (six) hours as needed for wheezing or shortness of breath. 150 mL 0   albuterol (VENTOLIN HFA) 108 (90 Base) MCG/ACT inhaler Inhale 2 puffs into the lungs every 6 (six) hours as needed for wheezing or shortness of breath. 6.7 g 5   amoxicillin-clavulanate (AUGMENTIN) 875-125 MG tablet Take 1 tablet by mouth 2 (two) times daily. 14 tablet 0   apixaban (ELIQUIS) 5 MG TABS tablet Take 1 tablet (5 mg total) by mouth 2 (two) times daily. 180 tablet 3   buPROPion (WELLBUTRIN SR) 150 MG 12 hr tablet Take 1 tablet (150 mg total) by mouth 2 (two) times daily. 180 tablet 3   cholecalciferol (VITAMIN D3) 25 MCG (1000 UNIT) tablet Take 1,000 Units by mouth at bedtime.     colchicine 0.6 MG tablet Take 1 tablet (0.6 mg total) by mouth every other day. 45 tablet 1  Continuous Glucose Sensor (FREESTYLE LIBRE 3 SENSOR) MISC Use one every 2 weeks. 6 each 3   cyclobenzaprine (FLEXERIL) 5 MG tablet Take by mouth.     dapagliflozin propanediol (FARXIGA) 10 MG TABS tablet Take 1 tablet (10 mg total) by mouth daily before breakfast. 90 tablet 3   febuxostat (ULORIC) 40 MG tablet Take 3 tablets (120 mg total) by mouth daily. 90 tablet 5   fluticasone (FLONASE) 50 MCG/ACT nasal spray Place 2 sprays into both nostrils daily. 16 g 11   gabapentin (NEURONTIN) 300 MG capsule Take 1 capsule (300 mg total) by mouth 2 (two) times daily. 180 capsule 0   hydrALAZINE (APRESOLINE) 25 MG tablet Take 1 tablet (25 mg total) by mouth every 8 (eight) hours. 270 tablet 3   HYDROcodone-acetaminophen (NORCO/VICODIN) 5-325 MG tablet Take 1-2 tablets by mouth every 6 (six) hours as needed for moderate pain. 40 tablet 0   hydrocortisone 2.5 % cream Apply topically 3 (three) times daily as needed. 30 g 3   insulin glargine-yfgn (SEMGLEE) 100 UNIT/ML Pen Inject 40 Units into the skin daily. 45 mL 3   insulin lispro (HUMALOG KWIKPEN) 100 UNIT/ML KwikPen Inject 10-20 Units into the skin 3 (three) times daily. Per sliding scale     Insulin Pen  Needle (TECHLITE PEN NEEDLES) 31G X 5 MM MISC 4 (four) times daily. 400 each 3   Insulin Pen Needle (TECHLITE PEN NEEDLES) 32G X 6 MM MISC Use 4 times a day 100 each 3   Insulin Pen Needle (UNIFINE PENTIPS) 31G X 5 MM MISC Use 4 times daily as directed 400 each 3   iron polysaccharides (FERREX 150) 150 MG capsule Take 1 capsule (150 mg total) by mouth 2 (two) times daily. 180 capsule 0   isosorbide mononitrate (IMDUR) 60 MG 24 hr tablet Take 0.5 tablets (30 mg total) by mouth daily. 30 tablet 6   lidocaine (LIDODERM) 5 % Place 1 patch onto the skin daily.PLACE 1 PATCH ONTO THE SKIN FOR 12 (TWELVE) HOURS. REMOVE & DISCARD PATCH WITHIN 12 HOURS OR AS DIRECTED BY MD (Patient taking differently: Place 1 patch onto the skin daily as needed.) 60 patch 11   lidocaine (XYLOCAINE) 5 % ointment Apply 1 Application topically 3 (three) times daily as needed. Apply pea size amount to area as needed for pain. 50 g 0   losartan (COZAAR) 25 MG tablet Take 1 tablet (25 mg total) by mouth daily. 90 tablet 3   metolazone (ZAROXOLYN) 2.5 MG tablet Take 1 tablet (2.5 mg total) by mouth 2 (two) times a week.     montelukast (SINGULAIR) 10 MG tablet TAKE 1 TABLET BY MOUTH AT BEDTIME. (Patient taking differently: Take 10 mg by mouth at bedtime.) 90 tablet 3   Multiple Vitamin (MULTIVITAMIN WITH MINERALS) TABS tablet Take 1 tablet by mouth daily.     mycophenolate (MYFORTIC) 180 MG EC tablet Take 2 tablets (360 mg total) by mouth 2 (two) times daily. 360 tablet 3   omeprazole (PRILOSEC) 20 MG capsule TAKE 1 CAPSULE BY MOUTH DAILY. (Patient taking differently: Take 20 mg by mouth at bedtime.) 90 capsule 3   potassium chloride (KLOR-CON) 10 MEQ tablet Take 6 tablets (60 mEq total) by mouth every morning AND 4 tablets (40 mEq total) every evening. 300 tablet 6   predniSONE (DELTASONE) 5 MG tablet Take 1 tablet (5 mg total) by mouth daily. 90 tablet 3   rosuvastatin (CRESTOR) 10 MG tablet Take 1 tablet (10 mg total) by mouth  daily. 90 tablet 3   spironolactone (ALDACTONE) 25 MG tablet Take 1 tablet (25 mg total) by mouth daily. 90 tablet 3   sulfamethoxazole-trimethoprim (BACTRIM) 400-80 MG tablet Take 1 tablet by mouth 3 (three) times a week every Monday, Wednesday, and Friday. 36 tablet 3   tacrolimus (PROGRAF) 1 MG capsule Take 2 capsules (2 mg total) by mouth 2 times daily. 120 capsule 11   tamsulosin (FLOMAX) 0.4 MG CAPS capsule Take 2 capsules (0.8 mg total) by mouth daily. 60 capsule 0   Testosterone 20.25 MG/ACT (1.62%) GEL Place 2 Pump onto the skin daily. 150 g 3   torsemide (DEMADEX) 100 MG tablet Take 1 tablet (100 mg total) by mouth 2 (two) times daily. 60 tablet 5   zinc sulfate 220 (50 Zn) MG capsule Take 220 mg by mouth daily.     No current facility-administered medications for this visit.   Allergies:   Contrast media [iodinated contrast media], Iodine, and Ibuprofen   Social History:  The patient  reports that he quit smoking about 28 years ago. His smoking use included cigars. He has never been exposed to tobacco smoke. He has never used smokeless tobacco. He reports that he does not currently use alcohol. He reports that he does not use drugs.   Family History:  The patient's family history includes Breast cancer in his maternal grandmother; Diabetes in his maternal grandmother; Heart disease in his father; Kidney disease in his father; Kidney failure in his father; Liver cancer in his paternal grandmother and paternal uncle; Valvular heart disease in his mother.   ROS:  Please see the history of present illness.   All other systems are personally reviewed and negative.   Recent Labs: 06/25/2022: Magnesium 2.3 08/05/2022: ALT 14; TSH 2.077 08/20/2022: B Natriuretic Peptide 471.8 10/21/2022: NT-Pro BNP 4,402 11/01/2022: BUN 93; Creatinine, Ser 2.60; Potassium 3.4; Sodium 136 11/21/2022: Hemoglobin 12.8; Platelet Count 127  Personally reviewed   Wt Readings from Last 3 Encounters:  12/16/22 254 lb  (115.2 kg)  12/03/22 263 lb 4 oz (119.4 kg)  11/18/22 256 lb (116.1 kg)    There were no vitals taken for this visit. General: NAD Neck: JVP 5cm Lungs: Clear to auscultation bilaterally with normal respiratory effort. CV: Nondisplaced PMI.  Heart irregular S1/S2, no S3/S4, no murmur.  No peripheral edema.  No carotid bruit.  Normal pedal pulses.  Abdomen: Soft, nontender, no hepatosplenomegaly, no distention.  Skin: Intact without lesions or rashes.  Neurologic: Alert and oriented x 3.  Psych: Normal affect. Extremities: No clubbing or cyanosis.  HEENT: Normal.   Assessment & Plan: 1. Chronic heart failure with Mid Range EF/ With prominent RV failure/pulmonary hypertension:  Ischemic cardiomyopathy.  ?If RV failure is related to severe OSA, he was a remote smoker. Echo in 2/21 with EF 45-50%, moderately dilated RV with moderately decreased systolic function, moderate pulmonary hypertension. Echo in 1/24 showed EF 40-45% , RV mod reduced, RVSP 49 mmHg, no AS, mild MR. Management complicated by CKD stage 3b.  Today, NYHA class II in terms of dyspnea.  He is not volume overloaded by exam, weight stable.  - Continue torsemide 100 mg bid.  Continue metolazone M/W/F. Repeat lab work today.  - Continue KCl 60 qam/40 qpm.   - Continue to hold hydralazine & imdur. Will switch losartan to nightly.  - Continue Farxiga 10 mg daily. Will need to watch closely for further UTIs.  Had recent Serratia pyelonephritis.  - Continue losartan 25mg  daily  -  decrease spironolactone to 12.5mg  daily - Tadalafil was stopped due to group 2 and 3 PH (not group 1).  - Repeat labs & echocardiogram today to assess for over diuresis v progressive RV failure.  2. Atrial fibrillation: Chronic. Rate controlled.  - Continue Eliquis.  - Not on beta blocker with history of bradycardia.   3. CAD: S/p CABG 2005.  Cardiolite 12/22 with prior MI, no ischemia.  No chest pain. High threshold for cath given CKD.  - No ASA given  Eliquis use.  - Continue Crestor 10 mg daily, good lipids in 2/24. 4. CKD Stage 3b: S/p renal transplantation in 2012. Followed by transplant center at [facility]. He remains on mycophenolate and tacrolimus.    - BMET today. 5. OSA: Continue Bipap every night.  6. HTN: BP controlled today but has been low at times with lightheadedness.  - continue losartan to 25 mg daily.   7. DM 2: On insulin. He follows with Endocrinology. - Continue dapagliflozin for now but watch for further GU symptoms. 8. Pulmonary hypertension: Primarily pulmonary venous hypertension by RHC in 1/24 though there may be group 3 PH component from OSA.  - Now off tadalafil.  9. Chronic Anemia: has been getting scheduled weekly IV Fe infusions, q Fridays 10. IgG monoclonal protein: Needs followup with his hematologist.    Followup in 2 months.   Eliezer Lofts Trampus Mcquerry 12/17/2022

## 2022-12-18 ENCOUNTER — Telehealth: Payer: Self-pay | Admitting: Internal Medicine

## 2022-12-18 DIAGNOSIS — I890 Lymphedema, not elsewhere classified: Secondary | ICD-10-CM | POA: Diagnosis not present

## 2022-12-18 DIAGNOSIS — L97822 Non-pressure chronic ulcer of other part of left lower leg with fat layer exposed: Secondary | ICD-10-CM | POA: Diagnosis not present

## 2022-12-18 DIAGNOSIS — I87333 Chronic venous hypertension (idiopathic) with ulcer and inflammation of bilateral lower extremity: Secondary | ICD-10-CM | POA: Diagnosis not present

## 2022-12-18 DIAGNOSIS — L97812 Non-pressure chronic ulcer of other part of right lower leg with fat layer exposed: Secondary | ICD-10-CM | POA: Diagnosis not present

## 2022-12-18 DIAGNOSIS — S81812A Laceration without foreign body, left lower leg, initial encounter: Secondary | ICD-10-CM | POA: Diagnosis not present

## 2022-12-18 DIAGNOSIS — S81809A Unspecified open wound, unspecified lower leg, initial encounter: Secondary | ICD-10-CM | POA: Diagnosis not present

## 2022-12-18 DIAGNOSIS — I10 Essential (primary) hypertension: Secondary | ICD-10-CM | POA: Diagnosis not present

## 2022-12-18 DIAGNOSIS — I480 Paroxysmal atrial fibrillation: Secondary | ICD-10-CM | POA: Diagnosis not present

## 2022-12-18 DIAGNOSIS — I5042 Chronic combined systolic (congestive) and diastolic (congestive) heart failure: Secondary | ICD-10-CM | POA: Diagnosis not present

## 2022-12-18 DIAGNOSIS — E662 Morbid (severe) obesity with alveolar hypoventilation: Secondary | ICD-10-CM | POA: Diagnosis not present

## 2022-12-18 DIAGNOSIS — E11622 Type 2 diabetes mellitus with other skin ulcer: Secondary | ICD-10-CM | POA: Diagnosis not present

## 2022-12-18 DIAGNOSIS — G4733 Obstructive sleep apnea (adult) (pediatric): Secondary | ICD-10-CM | POA: Diagnosis not present

## 2022-12-18 NOTE — Telephone Encounter (Signed)
Patient stated that he faxed over [employer] paperwork last week,he called in to see if it was received by Carollee Herter?

## 2022-12-19 DIAGNOSIS — S81812A Laceration without foreign body, left lower leg, initial encounter: Secondary | ICD-10-CM | POA: Diagnosis not present

## 2022-12-19 DIAGNOSIS — I87333 Chronic venous hypertension (idiopathic) with ulcer and inflammation of bilateral lower extremity: Secondary | ICD-10-CM | POA: Diagnosis not present

## 2022-12-19 DIAGNOSIS — I480 Paroxysmal atrial fibrillation: Secondary | ICD-10-CM | POA: Diagnosis not present

## 2022-12-19 DIAGNOSIS — I5042 Chronic combined systolic (congestive) and diastolic (congestive) heart failure: Secondary | ICD-10-CM | POA: Diagnosis not present

## 2022-12-19 DIAGNOSIS — I10 Essential (primary) hypertension: Secondary | ICD-10-CM | POA: Diagnosis not present

## 2022-12-19 DIAGNOSIS — E11622 Type 2 diabetes mellitus with other skin ulcer: Secondary | ICD-10-CM | POA: Diagnosis not present

## 2022-12-19 DIAGNOSIS — L97822 Non-pressure chronic ulcer of other part of left lower leg with fat layer exposed: Secondary | ICD-10-CM | POA: Diagnosis not present

## 2022-12-19 DIAGNOSIS — I890 Lymphedema, not elsewhere classified: Secondary | ICD-10-CM | POA: Diagnosis not present

## 2022-12-19 DIAGNOSIS — L97812 Non-pressure chronic ulcer of other part of right lower leg with fat layer exposed: Secondary | ICD-10-CM | POA: Diagnosis not present

## 2022-12-19 DIAGNOSIS — S81809A Unspecified open wound, unspecified lower leg, initial encounter: Secondary | ICD-10-CM | POA: Diagnosis not present

## 2022-12-19 NOTE — Telephone Encounter (Signed)
Called and spoke to pt. I do not see anything in our faxed forms. I have printed the last forms and made corrections.

## 2022-12-20 ENCOUNTER — Other Ambulatory Visit: Payer: Self-pay

## 2022-12-20 NOTE — Telephone Encounter (Signed)
Left a message for pt to let him know we have faxed the forms back.

## 2022-12-23 ENCOUNTER — Other Ambulatory Visit: Payer: Self-pay

## 2022-12-24 ENCOUNTER — Other Ambulatory Visit: Payer: Self-pay

## 2022-12-24 ENCOUNTER — Encounter: Payer: Commercial Managed Care - PPO | Attending: Physician Assistant | Admitting: Physician Assistant

## 2022-12-24 DIAGNOSIS — I890 Lymphedema, not elsewhere classified: Secondary | ICD-10-CM | POA: Diagnosis not present

## 2022-12-24 DIAGNOSIS — E1122 Type 2 diabetes mellitus with diabetic chronic kidney disease: Secondary | ICD-10-CM | POA: Diagnosis not present

## 2022-12-24 DIAGNOSIS — I87333 Chronic venous hypertension (idiopathic) with ulcer and inflammation of bilateral lower extremity: Secondary | ICD-10-CM | POA: Diagnosis not present

## 2022-12-24 DIAGNOSIS — Z8249 Family history of ischemic heart disease and other diseases of the circulatory system: Secondary | ICD-10-CM | POA: Insufficient documentation

## 2022-12-24 DIAGNOSIS — S80821A Blister (nonthermal), right lower leg, initial encounter: Secondary | ICD-10-CM | POA: Diagnosis not present

## 2022-12-24 DIAGNOSIS — Z940 Kidney transplant status: Secondary | ICD-10-CM | POA: Insufficient documentation

## 2022-12-24 DIAGNOSIS — L97812 Non-pressure chronic ulcer of other part of right lower leg with fat layer exposed: Secondary | ICD-10-CM | POA: Diagnosis not present

## 2022-12-24 DIAGNOSIS — S81811A Laceration without foreign body, right lower leg, initial encounter: Secondary | ICD-10-CM | POA: Diagnosis not present

## 2022-12-24 DIAGNOSIS — Z7901 Long term (current) use of anticoagulants: Secondary | ICD-10-CM | POA: Diagnosis not present

## 2022-12-24 DIAGNOSIS — N186 End stage renal disease: Secondary | ICD-10-CM | POA: Insufficient documentation

## 2022-12-24 DIAGNOSIS — E11622 Type 2 diabetes mellitus with other skin ulcer: Secondary | ICD-10-CM | POA: Insufficient documentation

## 2022-12-24 DIAGNOSIS — Z87891 Personal history of nicotine dependence: Secondary | ICD-10-CM | POA: Insufficient documentation

## 2022-12-24 DIAGNOSIS — I2510 Atherosclerotic heart disease of native coronary artery without angina pectoris: Secondary | ICD-10-CM | POA: Insufficient documentation

## 2022-12-24 DIAGNOSIS — I132 Hypertensive heart and chronic kidney disease with heart failure and with stage 5 chronic kidney disease, or end stage renal disease: Secondary | ICD-10-CM | POA: Insufficient documentation

## 2022-12-24 DIAGNOSIS — I480 Paroxysmal atrial fibrillation: Secondary | ICD-10-CM | POA: Diagnosis not present

## 2022-12-24 DIAGNOSIS — Z841 Family history of disorders of kidney and ureter: Secondary | ICD-10-CM | POA: Diagnosis not present

## 2022-12-24 DIAGNOSIS — I5042 Chronic combined systolic (congestive) and diastolic (congestive) heart failure: Secondary | ICD-10-CM | POA: Diagnosis not present

## 2022-12-24 DIAGNOSIS — S81812A Laceration without foreign body, left lower leg, initial encounter: Secondary | ICD-10-CM | POA: Diagnosis not present

## 2022-12-24 DIAGNOSIS — L97822 Non-pressure chronic ulcer of other part of left lower leg with fat layer exposed: Secondary | ICD-10-CM | POA: Insufficient documentation

## 2022-12-24 DIAGNOSIS — Z833 Family history of diabetes mellitus: Secondary | ICD-10-CM | POA: Diagnosis not present

## 2022-12-24 MED ORDER — COMIRNATY 30 MCG/0.3ML IM SUSY
PREFILLED_SYRINGE | INTRAMUSCULAR | 0 refills | Status: DC
  Filled 2022-12-24: qty 0.3, 1d supply, fill #0

## 2022-12-24 MED ORDER — FLULAVAL 0.5 ML IM SUSY
PREFILLED_SYRINGE | INTRAMUSCULAR | 0 refills | Status: DC
  Filled 2022-12-24: qty 0.5, 1d supply, fill #0

## 2022-12-24 NOTE — Progress Notes (Signed)
Weeks Of Treatment: 2 History: Arrhythmia, Congestive Heart Failure, Coronary Artery Disease, Clustered Wound: No Hypertension, Type II Diabetes, End Stage Renal Disease, Gout, Osteoarthritis, Neuropathy Photos Wound Measurements Length: (cm) 7.7 Width: (cm) 3.5 Depth: (cm) 0.1 Area: (cm) 21.166 Volume: (cm) 2.117 % Reduction in Area: 83.8% % Reduction in Volume: 83.8% Epithelialization: Small (1-33%) Tunneling: No Undermining: No Wound Description Classification: Full Thickness Without Exposed Suppor Exudate Amount: Medium Exudate Type: Serosanguineous Exudate Color: red, brown t Structures Foul Odor After Cleansing: No Slough/Fibrino Yes Wound Bed Granulation Amount: Large (67-100%) Exposed Structure Granulation Quality: Red, Pink, Friable Fat Layer (Subcutaneous Tissue) ExposedCRISTOBAL, ADVANI [name] ([id number]) [id number] Page 10 of 12 Granulation Quality: Red, Pink, Friable Fat Layer (Subcutaneous Tissue) Exposed: Yes Necrotic Amount: Small (1-33%) Necrotic Quality: Adherent Slough Treatment Notes Wound #8 (Lower Leg) Wound Laterality: Left, Circumferential Cleanser Soap and Water Discharge Instruction: Gently cleanse wound with antibacterial soap, rinse and pat dry prior to dressing wounds Wound Cleanser Discharge  Instruction: Wash your hands with soap and water. Remove old dressing, discard into plastic bag and place into trash. Cleanse the wound with Wound Cleanser prior to applying a clean dressing using gauze sponges, not tissues or cotton balls. Do not scrub or use excessive force. Pat dry using gauze sponges, not tissue or cotton balls. Peri-Wound Care Topical Primary Dressing Xeroform 5x9-HBD (in/in) Discharge Instruction: Apply Xeroform 5x9-HBD (in/in) as directed Secondary Dressing ABD Pad 5x9 (in/in) Discharge Instruction: Cover with ABD pad Zetuvit Plus 4x8 (in/in) Secured With Medipore T - 60M Medipore H Soft Cloth Surgical T ape ape, 2x2 (in/yd) Kerlix Roll Sterile or Non-Sterile 6-ply 4.5x4 (yd/yd) Discharge Instruction: Apply Kerlix as directed Tubigrip Size D, 3x10 (in/yd) Compression Wrap Compression Stockings Add-Ons Electronic Signature(s) Signed: 12/24/2022 4:05:41 PM By: [name] Entered By: [name] on 12/24/2022 07:56:55 -------------------------------------------------------------------------------- Wound Assessment Details Patient Name: Date of Service: Carl Jain [name]. 12/24/2022 7:30 A M Medical Record Number: [id number] Patient Account Number: [id number] Date of Birth/Sex: Treating RN: [birth year] (62 y.o. Carl Purser Primary Care Matteo Banke: [name] Other Clinician: Referring Evander Macaraeg: Treating Broly Hatfield/Extender: Derrek Monaco in Treatment: 2 Wound Status Wound Number: 9 Primary Trauma, Other Etiology: Wound Location: Right, Medial Lower Leg Wound Open Wounding Event: Skin Tear/Laceration Status: Date Acquired: 12/24/2022 Comorbid Cataracts, Anemia, Lymphedema, Asthma, Sleep Apnea, Weeks Of Treatment: 0 History: Arrhythmia, Congestive Heart Failure, Coronary Artery Disease, Clustered Wound: No Hypertension, Type II Diabetes, End Stage Renal Disease, Gout, Osteoarthritis, Neuropathy [name] ([id number])  [id number] Page 11 of 12 Photos Wound Measurements Length: (cm) 0.7 Width: (cm) 2 Depth: (cm) 0.1 Area: (cm) 1.1 Volume: (cm) 0.11 % Reduction in Area: % Reduction in Volume: Epithelialization: None Tunneling: No Undermining: No Wound Description Classification: Full Thickness Without Exposed Support Structures Exudate Amount: Medium Exudate Type: Serosanguineous Exudate Color: red, brown Foul Odor After Cleansing: No Slough/Fibrino Yes Wound Bed Granulation Amount: Large (67-100%) Granulation Quality: Red Necrotic Amount: None Present (0%) Treatment Notes Wound #9 (Lower Leg) Wound Laterality: Right, Medial Cleanser Soap and Water Discharge Instruction: Gently cleanse wound with antibacterial soap, rinse and pat dry prior to dressing wounds Wound Cleanser Discharge Instruction: Wash your hands with soap and water. Remove old dressing, discard into plastic bag and place into trash. Cleanse the wound with Wound Cleanser prior to applying a clean dressing using gauze sponges, not tissues or cotton balls. Do not scrub or use excessive force. Pat dry using gauze sponges, not tissue or cotton balls. Peri-Wound Care Topical  Primary Dressing Xeroform-HBD 2x2 (in/in) Discharge Instruction: Apply Xeroform-HBD 2x2 (in/in) as directed Secondary Dressing ABD Pad 5x9 (in/in) Discharge Instruction: Cover with ABD pad Zetuvit Plus 4x8 (in/in) Secured With Medipore T - 24M Medipore H Soft Cloth Surgical T ape ape, 2x2 (in/yd) Kerlix Roll Sterile or Non-Sterile 6-ply 4.5x4 (yd/yd) Discharge Instruction: Apply Kerlix as directed Tubigrip Size D, 3x10 (in/yd) Compression Wrap Compression Stockings Add-Ons Electronic Signature(s) Signed: 12/24/2022 4:05:41 PM By: [name] Entered By: [name] on 12/24/2022 07:58:48 [name] ([id number]) [id number] Page 12 of  12 -------------------------------------------------------------------------------- Vitals Details Patient Name: Date of Service: [name] 12/24/2022 7:30 A M Medical Record Number: [id number] Patient Account Number: [id number] Date of Birth/Sex: Treating RN: 12-Jul-1960 (62 y.o. Carl Purser Primary Care Diogo Anne: [name] Other Clinician: Referring Margurite Duffy: Treating Kendre Sires/Extender: Derrek Monaco in Treatment: 2 Vital Signs Time Taken: 07:45 Temperature ([name]): 97.7 Height (in): 70 Pulse (bpm): 76 Weight (lbs): 253 Respiratory Rate (breaths/min): 18 Body Mass Index (BMI): 36.3 Blood Pressure (mmHg): 129/69 Reference Range: 80 - 120 mg / dl Electronic Signature(s) Signed: 12/24/2022 4:05:41 PM By: [name] Entered By: [name] on 12/24/2022 07:46:02  Wound Imaging (photographs - any number of wounds) []  - 0 Wound Tracing (instead of photographs) []  - 0 Simple Wound Measurement - one wound X- 2 5 Complex Wound Measurement - multiple wounds INTERVENTIONS - Wound Dressings X - Small Wound Dressing one or multiple wounds 2 10 []  - 0 Medium Wound Dressing one or multiple wounds []  - 0 Large Wound Dressing one or multiple wounds X- 1 5 Application of Medications - topical []  - 0 Application of Medications - injection INTERVENTIONS - Miscellaneous []  - 0 External ear exam []  - 0 Specimen Collection (cultures, biopsies, blood, body fluids, etc.) []  - 0 Specimen(s) / Culture(s) sent or taken to Lab for analysis JGUADALUPE, OPIELA ([id number]) [id number] Page 3 of 12 []  - 0 Patient Transfer (multiple staff / Nurse, adult / Similar devices) []  - 0 Simple Staple / Suture removal (25 or less) []  - 0 Complex Staple / Suture removal (26 or more) []  - 0 Hypo / Hyperglycemic Management (close monitor of Blood Glucose) []  - 0 Ankle / Brachial Index (ABI) - do not check if billed separately X- 1 5 Vital Signs Has the patient been seen at the hospital within the last three years: Yes Total Score: 120 Level Of Care: New/Established - Level 4 Electronic Signature(s) Signed: 12/24/2022 4:05:41 PM By: [name] Entered By: [name] on 12/24/2022 08:18:25 -------------------------------------------------------------------------------- Encounter Discharge Information Details Patient Name: Date of Service: Carl Jain [name]. 12/24/2022 7:30 A M Medical Record Number: [id number] Patient Account Number: [id number] Date of Birth/Sex: Treating RN: 01-15-1961 (62 y.o. Carl Purser Primary Care Yolonda Purtle: [name] Other Clinician: Referring Azarian Starace: Treating Owens Hara/Extender: Derrek Monaco in Treatment: 2 Encounter Discharge Information Items Discharge Condition: Stable Ambulatory Status: Ambulatory Discharge Destination: Home Transportation: Private Auto Accompanied By: self Schedule Follow-up Appointment: Yes Clinical Summary of Care: Electronic Signature(s) Signed: 12/24/2022 4:05:41 PM By: [name] Entered By: [name] on 12/24/2022 08:19:20 -------------------------------------------------------------------------------- Lower Extremity Assessment Details Patient Name: Date of Service: DINO, BORNTREGER 12/24/2022 7:30 A M Medical Record Number: [id number] Patient Account Number: [id number] Date of Birth/Sex: Treating RN: 02/20/1961 (62 y.o. Carl Purser Primary Care Bobbyjoe Pabst: [name] Other Clinician: VINSON, TIETZE ([id number]) [id number] Page 4 of 12 Referring Atticus Wedin: Treating Quincey Nored/Extender: Derrek Monaco in Treatment: 2 Edema Assessment Assessed: [Left: No] [Right: No] [Left: Edema] [Right: :] Calf Left: Right: Point of Measurement: 34 cm From Medial Instep 37 cm 36.8 cm Ankle Left: Right: Point of Measurement: 12 cm From Medial Instep 22.5 cm 24 cm Vascular Assessment Pulses: Dorsalis Pedis Palpable: [Left:Yes] [Right:Yes] Extremity colors, hair growth, and conditions: Extremity Color: [Left:Hyperpigmented] [Right:Hyperpigmented] Hair Growth on Extremity: [Left:No] [Right:No] Temperature of Extremity: [Left:Warm < 3 seconds] [Right:Warm < 3 seconds] Toe Nail Assessment Left: Right: Thick: No No Discolored: No No Deformed: No No Improper Length and Hygiene: No No Electronic Signature(s) Signed: 12/24/2022 4:05:41 PM By: [name] Entered By: [name] on 12/24/2022  07:59:33 -------------------------------------------------------------------------------- Multi Wound Chart Details Patient Name: Date of Service: Carl Jain [name]. 12/24/2022 7:30 A M Medical Record Number: [id number] Patient Account Number: [id number] Date of Birth/Sex: Treating RN: [birth year] (62 y.o. Carl Purser Primary Care Natacha Jepsen: [name] Other Clinician: Referring Eriel Dunckel: Treating Izayah Miner/Extender: Derrek Monaco in Treatment: 2 Vital Signs Height(in): 70 Pulse(bpm): 76 Weight(lbs): 253 Blood Pressure(mmHg): 129/69 Body Mass Index(BMI): 36.3 Temperature([name]): 97.7 Respiratory Rate(breaths/min): 18 [7:Photos:] [9:[id number] Page 5 of 12] Right Lower Leg Left, Circumferential Lower  DENARIO, BAGOT [name] ([id number]) [id number] Page 1 of 12 Visit Report for 12/24/2022 Arrival Information Details Patient Name: Date of Service: ZYMIR, NAPOLI 12/24/2022 7:30 A M Medical Record Number: [id number] Patient Account Number: [id number] Date of Birth/Sex: Treating RN: 29-Mar-1960 (62 y.o. Carl Purser Primary Care Azari Hasler: [name] Other Clinician: Referring Lyndsie Wallman: Treating Kamir Selover/Extender: Derrek Monaco in Treatment: 2 Visit Information History Since Last Visit Added or deleted any medications: No Patient Arrived: Ambulatory Any new allergies or adverse reactions: No Arrival Time: 07:42 Had a fall or experienced change in No Accompanied By: self activities of daily living that may affect Transfer Assistance: None risk of falls: Patient Identification Verified: Yes Hospitalized since last visit: No Secondary Verification Process Completed: Yes Has Dressing in Place as Prescribed: Yes Patient Has Alerts: Yes Has Compression in Place as Prescribed: Yes Patient Alerts: Patient on Blood Thinner Pain Present Now: Yes type 2 diabetic FISTULA LUE Electronic Signature(s) Signed: 12/24/2022 4:05:41 PM By: [name] Entered By: [name] on 12/24/2022 07:45:44 -------------------------------------------------------------------------------- Clinic Level of Care Assessment Details Patient Name: Date of Service: MAXAMILIAN, AMADON 12/24/2022 7:30 A M Medical Record Number: [id number] Patient Account Number: [id number] Date of Birth/Sex: Treating RN: 01/27/1961 (62 y.o. Carl Purser Primary Care Shawna Wearing: [name] Other Clinician: Referring Demetrick Eichenberger: Treating Titania Gault/Extender: Derrek Monaco in Treatment: 2 Clinic Level of Care Assessment Items TOOL 4 Quantity Score []  - 0 Use when only an EandM is performed on FOLLOW-UP visit ASSESSMENTS - Nursing Assessment /  Reassessment X- 1 10 Reassessment of Co-morbidities (includes updates in patient status) X- 1 5 Reassessment of Adherence to Treatment Plan ASSESSMENTS - Wound and Skin A ssessment / Reassessment []  - 0 Simple Wound Assessment / Reassessment - one wound SUVAN, STCYR ([id number]) [id number] Page 2 of 12 X- 2 5 Complex Wound Assessment / Reassessment - multiple wounds []  - 0 Dermatologic / Skin Assessment (not related to wound area) ASSESSMENTS - Focused Assessment X- 1 5 Circumferential Edema Measurements - multi extremities []  - 0 Nutritional Assessment / Counseling / Intervention []  - 0 Lower Extremity Assessment (monofilament, tuning fork, pulses) []  - 0 Peripheral Arterial Disease Assessment (using hand held doppler) ASSESSMENTS - Ostomy and/or Continence Assessment and Care []  - 0 Incontinence Assessment and Management []  - 0 Ostomy Care Assessment and Management (repouching, etc.) PROCESS - Coordination of Care X - Simple Patient / Family Education for ongoing care 1 15 []  - 0 Complex (extensive) Patient / Family Education for ongoing care X- 1 10 Staff obtains Chiropractor, Records, T Results / Process Orders est []  - 0 Staff telephones HHA, Nursing Homes / Clarify orders / etc []  - 0 Routine Transfer to another Facility (non-emergent condition) []  - 0 Routine Hospital Admission (non-emergent condition) []  - 0 New Admissions / Manufacturing engineer / Ordering NPWT Apligraf, etc. , []  - 0 Emergency Hospital Admission (emergent condition) X- 1 10 Simple Discharge Coordination []  - 0 Complex (extensive) Discharge Coordination PROCESS - Special Needs []  - 0 Pediatric / Minor Patient Management []  - 0 Isolation Patient Management []  - 0 Hearing / Language / Visual special needs []  - 0 Assessment of Community assistance (transportation, D/C planning, etc.) []  - 0 Additional assistance / Altered mentation []  - 0 Support  Surface(s) Assessment (bed, cushion, seat, etc.) INTERVENTIONS - Wound Cleansing / Measurement []  - 0 Simple Wound Cleansing - one wound X- 2 5 Complex Wound Cleansing - multiple wounds X- 1 5  Weeks Of Treatment: 2 History: Arrhythmia, Congestive Heart Failure, Coronary Artery Disease, Clustered Wound: No Hypertension, Type II Diabetes, End Stage Renal Disease, Gout, Osteoarthritis, Neuropathy Photos Wound Measurements Length: (cm) 7.7 Width: (cm) 3.5 Depth: (cm) 0.1 Area: (cm) 21.166 Volume: (cm) 2.117 % Reduction in Area: 83.8% % Reduction in Volume: 83.8% Epithelialization: Small (1-33%) Tunneling: No Undermining: No Wound Description Classification: Full Thickness Without Exposed Suppor Exudate Amount: Medium Exudate Type: Serosanguineous Exudate Color: red, brown t Structures Foul Odor After Cleansing: No Slough/Fibrino Yes Wound Bed Granulation Amount: Large (67-100%) Exposed Structure Granulation Quality: Red, Pink, Friable Fat Layer (Subcutaneous Tissue) ExposedCRISTOBAL, ADVANI [name] ([id number]) [id number] Page 10 of 12 Granulation Quality: Red, Pink, Friable Fat Layer (Subcutaneous Tissue) Exposed: Yes Necrotic Amount: Small (1-33%) Necrotic Quality: Adherent Slough Treatment Notes Wound #8 (Lower Leg) Wound Laterality: Left, Circumferential Cleanser Soap and Water Discharge Instruction: Gently cleanse wound with antibacterial soap, rinse and pat dry prior to dressing wounds Wound Cleanser Discharge  Instruction: Wash your hands with soap and water. Remove old dressing, discard into plastic bag and place into trash. Cleanse the wound with Wound Cleanser prior to applying a clean dressing using gauze sponges, not tissues or cotton balls. Do not scrub or use excessive force. Pat dry using gauze sponges, not tissue or cotton balls. Peri-Wound Care Topical Primary Dressing Xeroform 5x9-HBD (in/in) Discharge Instruction: Apply Xeroform 5x9-HBD (in/in) as directed Secondary Dressing ABD Pad 5x9 (in/in) Discharge Instruction: Cover with ABD pad Zetuvit Plus 4x8 (in/in) Secured With Medipore T - 60M Medipore H Soft Cloth Surgical T ape ape, 2x2 (in/yd) Kerlix Roll Sterile or Non-Sterile 6-ply 4.5x4 (yd/yd) Discharge Instruction: Apply Kerlix as directed Tubigrip Size D, 3x10 (in/yd) Compression Wrap Compression Stockings Add-Ons Electronic Signature(s) Signed: 12/24/2022 4:05:41 PM By: [name] Entered By: [name] on 12/24/2022 07:56:55 -------------------------------------------------------------------------------- Wound Assessment Details Patient Name: Date of Service: Carl Jain [name]. 12/24/2022 7:30 A M Medical Record Number: [id number] Patient Account Number: [id number] Date of Birth/Sex: Treating RN: [birth year] (62 y.o. Carl Purser Primary Care Matteo Banke: [name] Other Clinician: Referring Evander Macaraeg: Treating Broly Hatfield/Extender: Derrek Monaco in Treatment: 2 Wound Status Wound Number: 9 Primary Trauma, Other Etiology: Wound Location: Right, Medial Lower Leg Wound Open Wounding Event: Skin Tear/Laceration Status: Date Acquired: 12/24/2022 Comorbid Cataracts, Anemia, Lymphedema, Asthma, Sleep Apnea, Weeks Of Treatment: 0 History: Arrhythmia, Congestive Heart Failure, Coronary Artery Disease, Clustered Wound: No Hypertension, Type II Diabetes, End Stage Renal Disease, Gout, Osteoarthritis, Neuropathy [name] ([id number])  [id number] Page 11 of 12 Photos Wound Measurements Length: (cm) 0.7 Width: (cm) 2 Depth: (cm) 0.1 Area: (cm) 1.1 Volume: (cm) 0.11 % Reduction in Area: % Reduction in Volume: Epithelialization: None Tunneling: No Undermining: No Wound Description Classification: Full Thickness Without Exposed Support Structures Exudate Amount: Medium Exudate Type: Serosanguineous Exudate Color: red, brown Foul Odor After Cleansing: No Slough/Fibrino Yes Wound Bed Granulation Amount: Large (67-100%) Granulation Quality: Red Necrotic Amount: None Present (0%) Treatment Notes Wound #9 (Lower Leg) Wound Laterality: Right, Medial Cleanser Soap and Water Discharge Instruction: Gently cleanse wound with antibacterial soap, rinse and pat dry prior to dressing wounds Wound Cleanser Discharge Instruction: Wash your hands with soap and water. Remove old dressing, discard into plastic bag and place into trash. Cleanse the wound with Wound Cleanser prior to applying a clean dressing using gauze sponges, not tissues or cotton balls. Do not scrub or use excessive force. Pat dry using gauze sponges, not tissue or cotton balls. Peri-Wound Care Topical  Primary Dressing Xeroform-HBD 2x2 (in/in) Discharge Instruction: Apply Xeroform-HBD 2x2 (in/in) as directed Secondary Dressing ABD Pad 5x9 (in/in) Discharge Instruction: Cover with ABD pad Zetuvit Plus 4x8 (in/in) Secured With Medipore T - 24M Medipore H Soft Cloth Surgical T ape ape, 2x2 (in/yd) Kerlix Roll Sterile or Non-Sterile 6-ply 4.5x4 (yd/yd) Discharge Instruction: Apply Kerlix as directed Tubigrip Size D, 3x10 (in/yd) Compression Wrap Compression Stockings Add-Ons Electronic Signature(s) Signed: 12/24/2022 4:05:41 PM By: [name] Entered By: [name] on 12/24/2022 07:58:48 [name] ([id number]) [id number] Page 12 of  12 -------------------------------------------------------------------------------- Vitals Details Patient Name: Date of Service: [name] 12/24/2022 7:30 A M Medical Record Number: [id number] Patient Account Number: [id number] Date of Birth/Sex: Treating RN: 12-Jul-1960 (62 y.o. Carl Purser Primary Care Diogo Anne: [name] Other Clinician: Referring Margurite Duffy: Treating Kendre Sires/Extender: Derrek Monaco in Treatment: 2 Vital Signs Time Taken: 07:45 Temperature ([name]): 97.7 Height (in): 70 Pulse (bpm): 76 Weight (lbs): 253 Respiratory Rate (breaths/min): 18 Body Mass Index (BMI): 36.3 Blood Pressure (mmHg): 129/69 Reference Range: 80 - 120 mg / dl Electronic Signature(s) Signed: 12/24/2022 4:05:41 PM By: [name] Entered By: [name] on 12/24/2022 07:46:02

## 2022-12-24 NOTE — Progress Notes (Addendum)
prior to applying a clean dressing using gauze sponges, not tissues or cotton balls. Do not scrub or use excessive force. Pat dry using gauze sponges, not tissue or cotton balls. Prim Dressing: Xeroform-HBD 2x2 (in/in) 1 x Per Day/30 Days ary Discharge Instructions: Apply Xeroform-HBD 2x2 (in/in) as directed Secondary Dressing: ABD Pad 5x9 (in/in) (Generic) 1 x Per Day/30 Days Discharge Instructions: Cover with ABD pad Secondary Dressing: Zetuvit Plus 4x8 (in/in) (Generic) 1 x Per Day/30 Days Secured With: Medipore T - 42M Medipore H Soft Cloth Surgical T ape ape, 2x2 (in/yd) (Generic) 1 x Per Day/30 Days Secured With: Kerlix Roll Sterile or Non-Sterile 6-ply 4.5x4 (yd/yd) (Generic) 1 x Per Day/30 Days Discharge Instructions: Apply Kerlix as directed Secured With: Tubigrip Size D, 3x10 (in/yd) 1 x Per Day/30 Days 1. I would recommend currently based on what we are seeing that we have the patient going to continue to monitor for any signs of infection or worsening. I do believe that is making good headway towards complete closure. 2. I am going to recommend that he look into getting derma saver's I did  actually go ahead and give him the handout for that today so that he can get those [name] ([id number]) [id number] Page 9 of 9 ordered. 3. I am can recommend that he should continue to monitor for any signs of infection or worsening obviously if anything changes he knows contact the office let me know. We will see patient back for reevaluation in 1 week here in the clinic. If anything worsens or changes patient will contact our office for additional recommendations. Electronic Signature(s) Signed: 12/24/2022 10:30:33 AM By: Carl Derry PA-C Entered By: [name] on 12/24/2022 10:30:33 -------------------------------------------------------------------------------- SuperBill Details Patient Name: Date of Service: [name]. 12/24/2022 Medical Record Number: [id number] Patient Account Number: [id number] Date of Birth/Sex: Treating RN: 1961/01/01 (62 y.o. [name] Primary Care Provider: Tillman [name] Other Clinician: Referring Provider: Treating Provider/Extender: [name] in Treatment: 2 Diagnosis Coding ICD-10 Codes Code Description [phone] Chronic venous hypertension (idiopathic) with ulcer and inflammation of bilateral lower extremity I89.0 Lymphedema, not elsewhere classified E11.622 Type 2 diabetes mellitus with other skin ulcer L97.812 Non-pressure chronic ulcer of other part of right lower leg with fat layer exposed S81.812A Laceration without foreign body, left lower leg, initial encounter L97.822 Non-pressure chronic ulcer of other part of left lower leg with fat layer exposed Z94.0 Kidney transplant status I10 Essential (primary) hypertension I50.42 Chronic combined systolic (congestive) and diastolic (congestive) heart failure I48.0 Paroxysmal atrial fibrillation Z79.01 Long term (current) use of anticoagulants Facility Procedures : CPT4 Code: [id number] Description: 99214 - WOUND CARE  VISIT-LEV 4 EST PT Modifier: Quantity: 1 Physician Procedures : CPT4 Code Description Modifier [id number] 99213 - WC PHYS LEVEL 3 - EST PT ICD-10 Diagnosis Description I87.333 Chronic venous hypertension (idiopathic) with ulcer and inflammation of bilateral lower extremity I89.0 Lymphedema, not elsewhere classified  E11.622 Type 2 diabetes mellitus with other skin ulcer L97.812 Non-pressure chronic ulcer of other part of right lower leg with fat layer exposed Quantity: 1 Electronic Signature(s) Signed: 12/24/2022 10:30:44 AM By: Carl Derry PA-C Entered By: [name] on 12/24/2022 10:30:43  prior to applying a clean dressing using gauze sponges, not tissues or cotton balls. Do not scrub or use excessive force. Pat dry using gauze sponges, not tissue or cotton balls. Prim Dressing: Xeroform-HBD 2x2 (in/in) 1 x Per Day/30 Days ary Discharge Instructions: Apply Xeroform-HBD 2x2 (in/in) as directed Secondary Dressing: ABD Pad 5x9 (in/in) (Generic) 1 x Per Day/30 Days Discharge Instructions: Cover with ABD pad Secondary Dressing: Zetuvit Plus 4x8 (in/in) (Generic) 1 x Per Day/30 Days Secured With: Medipore T - 42M Medipore H Soft Cloth Surgical T ape ape, 2x2 (in/yd) (Generic) 1 x Per Day/30 Days Secured With: Kerlix Roll Sterile or Non-Sterile 6-ply 4.5x4 (yd/yd) (Generic) 1 x Per Day/30 Days Discharge Instructions: Apply Kerlix as directed Secured With: Tubigrip Size D, 3x10 (in/yd) 1 x Per Day/30 Days 1. I would recommend currently based on what we are seeing that we have the patient going to continue to monitor for any signs of infection or worsening. I do believe that is making good headway towards complete closure. 2. I am going to recommend that he look into getting derma saver's I did  actually go ahead and give him the handout for that today so that he can get those [name] ([id number]) [id number] Page 9 of 9 ordered. 3. I am can recommend that he should continue to monitor for any signs of infection or worsening obviously if anything changes he knows contact the office let me know. We will see patient back for reevaluation in 1 week here in the clinic. If anything worsens or changes patient will contact our office for additional recommendations. Electronic Signature(s) Signed: 12/24/2022 10:30:33 AM By: Carl Derry PA-C Entered By: [name] on 12/24/2022 10:30:33 -------------------------------------------------------------------------------- SuperBill Details Patient Name: Date of Service: [name]. 12/24/2022 Medical Record Number: [id number] Patient Account Number: [id number] Date of Birth/Sex: Treating RN: 1961/01/01 (62 y.o. [name] Primary Care Provider: Tillman [name] Other Clinician: Referring Provider: Treating Provider/Extender: [name] in Treatment: 2 Diagnosis Coding ICD-10 Codes Code Description [phone] Chronic venous hypertension (idiopathic) with ulcer and inflammation of bilateral lower extremity I89.0 Lymphedema, not elsewhere classified E11.622 Type 2 diabetes mellitus with other skin ulcer L97.812 Non-pressure chronic ulcer of other part of right lower leg with fat layer exposed S81.812A Laceration without foreign body, left lower leg, initial encounter L97.822 Non-pressure chronic ulcer of other part of left lower leg with fat layer exposed Z94.0 Kidney transplant status I10 Essential (primary) hypertension I50.42 Chronic combined systolic (congestive) and diastolic (congestive) heart failure I48.0 Paroxysmal atrial fibrillation Z79.01 Long term (current) use of anticoagulants Facility Procedures : CPT4 Code: [id number] Description: 99214 - WOUND CARE  VISIT-LEV 4 EST PT Modifier: Quantity: 1 Physician Procedures : CPT4 Code Description Modifier [id number] 99213 - WC PHYS LEVEL 3 - EST PT ICD-10 Diagnosis Description I87.333 Chronic venous hypertension (idiopathic) with ulcer and inflammation of bilateral lower extremity I89.0 Lymphedema, not elsewhere classified  E11.622 Type 2 diabetes mellitus with other skin ulcer L97.812 Non-pressure chronic ulcer of other part of right lower leg with fat layer exposed Quantity: 1 Electronic Signature(s) Signed: 12/24/2022 10:30:44 AM By: Carl Derry PA-C Entered By: [name] on 12/24/2022 10:30:43  Secured With: Tubigrip Size D, 3x10 (in/yd) 1 x Per Day/30 Days Wound #9 - Lower Leg Wound Laterality: Right, Medial Cleanser: Soap and Water 1 x Per Day/30 Days Discharge Instructions: Gently cleanse wound with antibacterial soap, rinse and pat dry prior to dressing wounds Cleanser: Wound Cleanser 1 x Per Day/30 Days Discharge Instructions: Wash your hands with soap and water. Remove old dressing, discard into plastic bag and place  into trash. Cleanse the wound with Wound Cleanser prior to applying a clean dressing using gauze sponges, not tissues or cotton balls. Do not scrub or use excessive force. Pat dry using gauze sponges, not tissue or cotton balls. Prim Dressing: Xeroform-HBD 2x2 (in/in) 1 x Per Day/30 Days ary Discharge Instructions: Apply Xeroform-HBD 2x2 (in/in) as directed Secondary Dressing: ABD Pad 5x9 (in/in) (Generic) 1 x Per Day/30 Days Discharge Instructions: Cover with ABD pad Secondary Dressing: Zetuvit Plus 4x8 (in/in) (Generic) 1 x Per Day/30 Days Secured With: Medipore T - 33M Medipore H Soft Cloth Surgical T ape ape, 2x2 (in/yd) (Generic) 1 x Per Day/30 Days Secured With: Kerlix Roll Sterile or Non-Sterile 6-ply 4.5x4 (yd/yd) (Generic) 1 x Per Day/30 Days Discharge Instructions: Apply Kerlix as directed Secured With: Tubigrip Size D, 3x10 (in/yd) 1 x Per Day/30 Days Electronic Signature(s) Signed: 12/24/2022 4:05:41 PM By: Luiz Blare, Nickolas Madrid [name] ([id number]) [id number] Page 5 of 9 Signed: 12/24/2022 4:56:48 PM By: Carl Derry PA-C Entered By: [name] on 12/24/2022 08:17:51 -------------------------------------------------------------------------------- Problem List Details Patient Name: Date of Service: [name]. 12/24/2022 7:30 A M Medical Record Number: [id number] Patient Account Number: [id number] Date of Birth/Sex: Treating RN: [birth year] (62 y.o. [name] Primary Care Provider: Tillman [name] Other Clinician: Referring Provider: Treating Provider/Extender: [name] in Treatment: 2 Active Problems ICD-10 Encounter Code Description Active Date MDM Diagnosis I87.333 Chronic venous hypertension (idiopathic) with ulcer and inflammation of 12/05/2022 No Yes bilateral lower extremity I89.0 Lymphedema, not elsewhere classified 12/05/2022 No Yes E11.622 Type 2 diabetes mellitus with other skin ulcer  12/05/2022 No Yes L97.812 Non-pressure chronic ulcer of other part of right lower leg with fat layer 12/05/2022 No Yes exposed S81.812A Laceration without foreign body, left lower leg, initial encounter 12/05/2022 No Yes L97.822 Non-pressure chronic ulcer of other part of left lower leg with fat layer exposed9/02/2023 No Yes Z94.0 Kidney transplant status 12/05/2022 No Yes I10 Essential (primary) hypertension 12/05/2022 No Yes I50.42 Chronic combined systolic (congestive) and diastolic (congestive) heart failure 12/05/2022 No Yes I48.0 Paroxysmal atrial fibrillation 12/05/2022 No Yes Z79.01 Long term (current) use of anticoagulants 12/05/2022 No Yes OSBORNE, SERIO ([id number]) [id number] Page 6 of 9 Inactive Problems Resolved Problems Electronic Signature(s) Signed: 12/24/2022 7:57:32 AM By: Carl Derry PA-C Entered By: [name] on 12/24/2022 07:57:32 -------------------------------------------------------------------------------- Progress Note Details Patient Name: Date of Service: [name]. 12/24/2022 7:30 A M Medical Record Number: [id number] Patient Account Number: [id number] Date of Birth/Sex: Treating RN: 1960-11-05 (62 y.o. [name] Primary Care Provider: Tillman [name] Other Clinician: Referring Provider: Treating Provider/Extender: [name] in Treatment: 2 Subjective Chief Complaint Information obtained from Patient Bilateral LE Ulcers History of Present Illness (HPI) The following HPI elements were documented for the patient's wound: Location: left anterior shin and left medial calf Quality: Patient reports experiencing a dull pain to affected area(s). Severity: Patient states wound are getting worse. Duration: Patient has had the wound for > 3 months prior to seeking treatment at the wound center Timing: Pain in  JAHLANI, LORENTZ ([id number]) [id number] Page 1 of 9 Visit Report for 12/24/2022 Chief Complaint Document Details Patient Name: Date of Service: ALEXANDROS, EWAN 12/24/2022 7:30 A M Medical Record Number: [id number] Patient Account Number: [id number] Date of Birth/Sex: Treating RN: 10/22/1960 (62 y.o. [name] Primary Care Provider: Tillman [name] Other Clinician: Referring Provider: Treating Provider/Extender: [name] in Treatment: 2 Information Obtained from: Patient Chief Complaint Bilateral LE Ulcers Electronic Signature(s) Signed: 12/24/2022 7:57:36 AM By: Carl Derry PA-C Entered By: [name] on 12/24/2022 07:57:35 -------------------------------------------------------------------------------- HPI Details Patient Name: Date of Service: [name]. 12/24/2022 7:30 A M Medical Record Number: [id number] Patient Account Number: [id number] Date of Birth/Sex: Treating RN: [birth year] (62 y.o. [name] Primary Care Provider: Tillman [name] Other Clinician: Referring Provider: Treating Provider/Extender: [name] in Treatment: 2 History of Present Illness Location: left anterior shin and left medial calf Quality: Patient reports experiencing a dull pain to affected area(s). Severity: Patient states wound are getting worse. Duration: Patient has had the wound for > 3 months prior to seeking treatment at the wound center Timing: Pain in wound is Intermittent (comes and goes Context: The wound appeared gradually over time Modifying Factors: Other treatment(s) tried include:had surgery on the left anterior shin for a hematoma which has been nonhealing for the last 8 months ssociated Signs and Symptoms: Patient reports having increase swelling. A HPI Description: 62 year old diabetic was last hemoglobin A1c was 7.2% has been reviewed today with 2 ulcerated areas in his left lower  extremity which she's had for about 8 months and the one most recently was started as a blister for 2 weeks. he has a past medical history of atrial fibrillation, coronary artery disease, CHF, chronic venous stasis dermatitis, diabetes mellitus, hypertension, sleep apnea and is status post kidney transplant in 2012. He is a former smoker and quit cigars in 1996. In the remote past he's had some arterial and venous duplex studies done but does not recall the results no was ever treated for venous insufficiency. 06/10/2016 -- -- had a office visit at [facility] seen by the physicians assistant Cleda Daub. Patient underwent an ABI which showed medial calcification, normal tibial artery Doppler waveform, PPG waveforms and toe brachial indices suggest normal arterial perfusion to bilateral lower extremities. right TBI was 0.97 and left was 0.84 SAKET, HELLSTROM [name] ([id number]) [id number] Page 2 of 9 Bilateral venous duplex was notable for venous reflux in the left greater saphenous vein and popliteal veins.. A left lower extremity endovenous laser ablation was recommended. 06/27/2016 -- I understand he spoke to his insurance company and there is a 3 month waiting period with conservative therapy prior to authorizing surgical intervention. 07/12/16- he is here for follow-up evaluation of a left lower extremity ulcer. He did bring his compression stockings today. He is voicing no complaints or concerns Readmission: 04/16/2021 upon evaluation today patient appears for reevaluation here in the clinic he was last seen in April 2018. At that time he was having issues with venous stasis ulcers and that again is what he is dealing with today as well. Fortunately I do not see any signs of active infection at this time which is great news and overall very pleased in that regard. Nonetheless he does have some areas of eschar which is going require debridement to clear  away some of the necrotic debris so that we can hopefully get this healed as quickly as possible. That was  into disease process. Alert and Oriented x 3. pleasant and cooperative. Notes Upon inspection patient's wound bed actually showed signs of poor granulation and epithelization at this point. Fortunately there does not appear to be any signs of active infection locally or systemically which is great news and in general I do believe that the patient is making headway towards complete closure. Nonetheless with regard to the right leg and the new wounds currently I do believe that he is going to require treatment here and we will get a going to get this started back with the  Xeroform. Electronic Signature(s) Signed: 12/24/2022 10:29:53 AM By: Carl Derry PA-C Entered By: [name] on 12/24/2022 10:29:52 -------------------------------------------------------------------------------- Physician Orders Details Patient Name: Date of Service: [name]. 12/24/2022 7:30 A M Medical Record Number: [id number] Patient Account Number: [id number] Date of Birth/Sex: Treating RN: 03-Jul-1960 (62 y.o. [name] Primary Care Provider: Tillman [name] Other Clinician: Referring Provider: Treating Provider/Extender: [name] in Treatment: 2 Verbal / Phone Orders: No Diagnosis Coding ICD-10 Coding Code Description [phone] Chronic venous hypertension (idiopathic) with ulcer and inflammation of bilateral lower extremity I89.0 Lymphedema, not elsewhere classified E11.622 Type 2 diabetes mellitus with other skin ulcer L97.812 Non-pressure chronic ulcer of other part of right lower leg with fat layer exposed S81.812A Laceration without foreign body, left lower leg, initial encounter L97.822 Non-pressure chronic ulcer of other part of left lower leg with fat layer exposed MIKE, HAMRE [name] ([id number]) [id number] Page 4 of 9 Z94.0 Kidney transplant status I10 Essential (primary) hypertension I50.42 Chronic combined systolic (congestive) and diastolic (congestive) heart failure I48.0 Paroxysmal atrial fibrillation Z79.01 Long term (current) use of anticoagulants Follow-up Appointments Return Appointment in 1 week. Bathing/ Shower/ Hygiene May shower; gently cleanse wound with antibacterial soap, rinse and pat dry prior to dressing wounds No tub bath. Anesthetic (Use 'Patient Medications' Section for Anesthetic Order Entry) Lidocaine applied to wound bed Edema Control - Lymphedema / Segmental Compressive Device / Other Tubigrip single layer applied. - bilaterally size D Elevate, Exercise Daily and A void  Standing for Long Periods of Time. Elevate leg(s) parallel to the floor when sitting. DO YOUR BEST to sleep in the bed at night. DO NOT sleep in your recliner. Long hours of sitting in a recliner leads to swelling of the legs and/or potential wounds on your backside. Wound Treatment Wound #8 - Lower Leg Wound Laterality: Left, Circumferential Cleanser: Soap and Water 1 x Per Day/30 Days Discharge Instructions: Gently cleanse wound with antibacterial soap, rinse and pat dry prior to dressing wounds Cleanser: Wound Cleanser 1 x Per Day/30 Days Discharge Instructions: Wash your hands with soap and water. Remove old dressing, discard into plastic bag and place into trash. Cleanse the wound with Wound Cleanser prior to applying a clean dressing using gauze sponges, not tissues or cotton balls. Do not scrub or use excessive force. Pat dry using gauze sponges, not tissue or cotton balls. Prim Dressing: Xeroform 5x9-HBD (in/in) (Generic) 1 x Per Day/30 Days ary Discharge Instructions: Apply Xeroform 5x9-HBD (in/in) as directed Secondary Dressing: ABD Pad 5x9 (in/in) (Generic) 1 x Per Day/30 Days Discharge Instructions: Cover with ABD pad Secondary Dressing: Zetuvit Plus 4x8 (in/in) (Generic) 1 x Per Day/30 Days Secured With: Medipore T - 55M Medipore H Soft Cloth Surgical T ape ape, 2x2 (in/yd) (Generic) 1 x Per Day/30 Days Secured With: Kerlix Roll Sterile or Non-Sterile 6-ply 4.5x4 (yd/yd) (Generic) 1 x Per Day/30 Days Discharge Instructions: Apply Kerlix as directed  JAHLANI, LORENTZ ([id number]) [id number] Page 1 of 9 Visit Report for 12/24/2022 Chief Complaint Document Details Patient Name: Date of Service: ALEXANDROS, EWAN 12/24/2022 7:30 A M Medical Record Number: [id number] Patient Account Number: [id number] Date of Birth/Sex: Treating RN: 10/22/1960 (62 y.o. [name] Primary Care Provider: Tillman [name] Other Clinician: Referring Provider: Treating Provider/Extender: [name] in Treatment: 2 Information Obtained from: Patient Chief Complaint Bilateral LE Ulcers Electronic Signature(s) Signed: 12/24/2022 7:57:36 AM By: Carl Derry PA-C Entered By: [name] on 12/24/2022 07:57:35 -------------------------------------------------------------------------------- HPI Details Patient Name: Date of Service: [name]. 12/24/2022 7:30 A M Medical Record Number: [id number] Patient Account Number: [id number] Date of Birth/Sex: Treating RN: [birth year] (62 y.o. [name] Primary Care Provider: Tillman [name] Other Clinician: Referring Provider: Treating Provider/Extender: [name] in Treatment: 2 History of Present Illness Location: left anterior shin and left medial calf Quality: Patient reports experiencing a dull pain to affected area(s). Severity: Patient states wound are getting worse. Duration: Patient has had the wound for > 3 months prior to seeking treatment at the wound center Timing: Pain in wound is Intermittent (comes and goes Context: The wound appeared gradually over time Modifying Factors: Other treatment(s) tried include:had surgery on the left anterior shin for a hematoma which has been nonhealing for the last 8 months ssociated Signs and Symptoms: Patient reports having increase swelling. A HPI Description: 62 year old diabetic was last hemoglobin A1c was 7.2% has been reviewed today with 2 ulcerated areas in his left lower  extremity which she's had for about 8 months and the one most recently was started as a blister for 2 weeks. he has a past medical history of atrial fibrillation, coronary artery disease, CHF, chronic venous stasis dermatitis, diabetes mellitus, hypertension, sleep apnea and is status post kidney transplant in 2012. He is a former smoker and quit cigars in 1996. In the remote past he's had some arterial and venous duplex studies done but does not recall the results no was ever treated for venous insufficiency. 06/10/2016 -- -- had a office visit at [facility] seen by the physicians assistant Cleda Daub. Patient underwent an ABI which showed medial calcification, normal tibial artery Doppler waveform, PPG waveforms and toe brachial indices suggest normal arterial perfusion to bilateral lower extremities. right TBI was 0.97 and left was 0.84 SAKET, HELLSTROM [name] ([id number]) [id number] Page 2 of 9 Bilateral venous duplex was notable for venous reflux in the left greater saphenous vein and popliteal veins.. A left lower extremity endovenous laser ablation was recommended. 06/27/2016 -- I understand he spoke to his insurance company and there is a 3 month waiting period with conservative therapy prior to authorizing surgical intervention. 07/12/16- he is here for follow-up evaluation of a left lower extremity ulcer. He did bring his compression stockings today. He is voicing no complaints or concerns Readmission: 04/16/2021 upon evaluation today patient appears for reevaluation here in the clinic he was last seen in April 2018. At that time he was having issues with venous stasis ulcers and that again is what he is dealing with today as well. Fortunately I do not see any signs of active infection at this time which is great news and overall very pleased in that regard. Nonetheless he does have some areas of eschar which is going require debridement to clear  away some of the necrotic debris so that we can hopefully get this healed as quickly as possible. That was  has a small opening today. I think he is at the point where hopefully by next week this will be resolved. Nonetheless I do feel like that he is still having quite a bit of an issue here all things considered. Readmission: 12-05-2022 upon evaluation today patient appears to be doing poorly currently in regard to his wound. With that being said I  do believe that the patient is currently having some issues again has been little over a year since I last saw him March 2023. At this point he tells me he has 2 issues 1 where he hit his left leg on a piece of furniture which I think is the primary concern there. The other issue is that of just having blisters popping up on his right leg. He tells me he is really not wearing compression socks regularly maybe 3 times per week when I pinned him down on it is the most that he is wearing these and not even certain if that regular or not to be perfectly honest. With that being said I do believe that he would benefit from a likely the compression therapy but at the same time he has such tremendous pain in the past he is always refused this therefore I think Tubigrip is probably the best way to go at this point considering he cannot even get his compression socks on currently due to the pain. Patient does have a past medical history which is significant for chronic venous hypertension, lymphedema, diabetes mellitus type 2, he is a kidney transplant patient, has hypertension, congestive heart failure, atrial fibrillation, and is on long-term anticoagulant therapy in the form of Eliquis. 12-12-2022 upon evaluation today patient appears to be doing well with regard to his wound he does have a lot of new skin growth which is good news unfortunately however has had a lot of burning and pain with the Hydrofera Blue that also seem to be sticking and causing a lot of bleeding which I do not like. We may need to try something a little bit different here. 12-17-2022 upon evaluation today patient appears to be doing well currently with regard to his legs. He is actually showing much less Cinoman bleeding and overall seems to be doing much better compared to where he was previous. Fortunately I do not see any evidence of active infection locally or systemically which is great news. No fevers, chills, nausea, vomiting, or  diarrhea. 12-24-2022 upon evaluation today patient appears to be doing decently well in regard to his wounds. Has been tolerating the dressing changes without complication. With that being said unfortunately on the right leg he actually hit his leg on the mower causing a new wound on the medial aspect. This had previously been completely healed. I think that he may benefit from derma savers to try to prevent this from being an ongoing and frequent issue for him. Electronic Signature(s) Signed: 12/24/2022 10:29:20 AM By: Carl Derry PA-C Entered By: [name] on 12/24/2022 10:29:20 Isabella Bowens ([id number]) [id number] Page 3 of 9 -------------------------------------------------------------------------------- Physical Exam Details Patient Name: Date of Service: DAETON, KLUTH 12/24/2022 7:30 A M Medical Record Number: [id number] Patient Account Number: [id number] Date of Birth/Sex: Treating RN: 1960/10/09 (62 y.o. [name] Primary Care Provider: Tillman [name] Other Clinician: Referring Provider: Treating Provider/Extender: [name] in Treatment: 2 Constitutional Well-nourished and well-hydrated in no acute distress. Respiratory normal breathing without difficulty. Psychiatric this patient is able to make decisions and demonstrates good insight  into disease process. Alert and Oriented x 3. pleasant and cooperative. Notes Upon inspection patient's wound bed actually showed signs of poor granulation and epithelization at this point. Fortunately there does not appear to be any signs of active infection locally or systemically which is great news and in general I do believe that the patient is making headway towards complete closure. Nonetheless with regard to the right leg and the new wounds currently I do believe that he is going to require treatment here and we will get a going to get this started back with the  Xeroform. Electronic Signature(s) Signed: 12/24/2022 10:29:53 AM By: Carl Derry PA-C Entered By: [name] on 12/24/2022 10:29:52 -------------------------------------------------------------------------------- Physician Orders Details Patient Name: Date of Service: [name]. 12/24/2022 7:30 A M Medical Record Number: [id number] Patient Account Number: [id number] Date of Birth/Sex: Treating RN: 03-Jul-1960 (62 y.o. [name] Primary Care Provider: Tillman [name] Other Clinician: Referring Provider: Treating Provider/Extender: [name] in Treatment: 2 Verbal / Phone Orders: No Diagnosis Coding ICD-10 Coding Code Description [phone] Chronic venous hypertension (idiopathic) with ulcer and inflammation of bilateral lower extremity I89.0 Lymphedema, not elsewhere classified E11.622 Type 2 diabetes mellitus with other skin ulcer L97.812 Non-pressure chronic ulcer of other part of right lower leg with fat layer exposed S81.812A Laceration without foreign body, left lower leg, initial encounter L97.822 Non-pressure chronic ulcer of other part of left lower leg with fat layer exposed MIKE, HAMRE [name] ([id number]) [id number] Page 4 of 9 Z94.0 Kidney transplant status I10 Essential (primary) hypertension I50.42 Chronic combined systolic (congestive) and diastolic (congestive) heart failure I48.0 Paroxysmal atrial fibrillation Z79.01 Long term (current) use of anticoagulants Follow-up Appointments Return Appointment in 1 week. Bathing/ Shower/ Hygiene May shower; gently cleanse wound with antibacterial soap, rinse and pat dry prior to dressing wounds No tub bath. Anesthetic (Use 'Patient Medications' Section for Anesthetic Order Entry) Lidocaine applied to wound bed Edema Control - Lymphedema / Segmental Compressive Device / Other Tubigrip single layer applied. - bilaterally size D Elevate, Exercise Daily and A void  Standing for Long Periods of Time. Elevate leg(s) parallel to the floor when sitting. DO YOUR BEST to sleep in the bed at night. DO NOT sleep in your recliner. Long hours of sitting in a recliner leads to swelling of the legs and/or potential wounds on your backside. Wound Treatment Wound #8 - Lower Leg Wound Laterality: Left, Circumferential Cleanser: Soap and Water 1 x Per Day/30 Days Discharge Instructions: Gently cleanse wound with antibacterial soap, rinse and pat dry prior to dressing wounds Cleanser: Wound Cleanser 1 x Per Day/30 Days Discharge Instructions: Wash your hands with soap and water. Remove old dressing, discard into plastic bag and place into trash. Cleanse the wound with Wound Cleanser prior to applying a clean dressing using gauze sponges, not tissues or cotton balls. Do not scrub or use excessive force. Pat dry using gauze sponges, not tissue or cotton balls. Prim Dressing: Xeroform 5x9-HBD (in/in) (Generic) 1 x Per Day/30 Days ary Discharge Instructions: Apply Xeroform 5x9-HBD (in/in) as directed Secondary Dressing: ABD Pad 5x9 (in/in) (Generic) 1 x Per Day/30 Days Discharge Instructions: Cover with ABD pad Secondary Dressing: Zetuvit Plus 4x8 (in/in) (Generic) 1 x Per Day/30 Days Secured With: Medipore T - 55M Medipore H Soft Cloth Surgical T ape ape, 2x2 (in/yd) (Generic) 1 x Per Day/30 Days Secured With: Kerlix Roll Sterile or Non-Sterile 6-ply 4.5x4 (yd/yd) (Generic) 1 x Per Day/30 Days Discharge Instructions: Apply Kerlix as directed  prior to applying a clean dressing using gauze sponges, not tissues or cotton balls. Do not scrub or use excessive force. Pat dry using gauze sponges, not tissue or cotton balls. Prim Dressing: Xeroform-HBD 2x2 (in/in) 1 x Per Day/30 Days ary Discharge Instructions: Apply Xeroform-HBD 2x2 (in/in) as directed Secondary Dressing: ABD Pad 5x9 (in/in) (Generic) 1 x Per Day/30 Days Discharge Instructions: Cover with ABD pad Secondary Dressing: Zetuvit Plus 4x8 (in/in) (Generic) 1 x Per Day/30 Days Secured With: Medipore T - 42M Medipore H Soft Cloth Surgical T ape ape, 2x2 (in/yd) (Generic) 1 x Per Day/30 Days Secured With: Kerlix Roll Sterile or Non-Sterile 6-ply 4.5x4 (yd/yd) (Generic) 1 x Per Day/30 Days Discharge Instructions: Apply Kerlix as directed Secured With: Tubigrip Size D, 3x10 (in/yd) 1 x Per Day/30 Days 1. I would recommend currently based on what we are seeing that we have the patient going to continue to monitor for any signs of infection or worsening. I do believe that is making good headway towards complete closure. 2. I am going to recommend that he look into getting derma saver's I did  actually go ahead and give him the handout for that today so that he can get those [name] ([id number]) [id number] Page 9 of 9 ordered. 3. I am can recommend that he should continue to monitor for any signs of infection or worsening obviously if anything changes he knows contact the office let me know. We will see patient back for reevaluation in 1 week here in the clinic. If anything worsens or changes patient will contact our office for additional recommendations. Electronic Signature(s) Signed: 12/24/2022 10:30:33 AM By: Carl Derry PA-C Entered By: [name] on 12/24/2022 10:30:33 -------------------------------------------------------------------------------- SuperBill Details Patient Name: Date of Service: [name]. 12/24/2022 Medical Record Number: [id number] Patient Account Number: [id number] Date of Birth/Sex: Treating RN: 1961/01/01 (62 y.o. [name] Primary Care Provider: Tillman [name] Other Clinician: Referring Provider: Treating Provider/Extender: [name] in Treatment: 2 Diagnosis Coding ICD-10 Codes Code Description [phone] Chronic venous hypertension (idiopathic) with ulcer and inflammation of bilateral lower extremity I89.0 Lymphedema, not elsewhere classified E11.622 Type 2 diabetes mellitus with other skin ulcer L97.812 Non-pressure chronic ulcer of other part of right lower leg with fat layer exposed S81.812A Laceration without foreign body, left lower leg, initial encounter L97.822 Non-pressure chronic ulcer of other part of left lower leg with fat layer exposed Z94.0 Kidney transplant status I10 Essential (primary) hypertension I50.42 Chronic combined systolic (congestive) and diastolic (congestive) heart failure I48.0 Paroxysmal atrial fibrillation Z79.01 Long term (current) use of anticoagulants Facility Procedures : CPT4 Code: [id number] Description: 99214 - WOUND CARE  VISIT-LEV 4 EST PT Modifier: Quantity: 1 Physician Procedures : CPT4 Code Description Modifier [id number] 99213 - WC PHYS LEVEL 3 - EST PT ICD-10 Diagnosis Description I87.333 Chronic venous hypertension (idiopathic) with ulcer and inflammation of bilateral lower extremity I89.0 Lymphedema, not elsewhere classified  E11.622 Type 2 diabetes mellitus with other skin ulcer L97.812 Non-pressure chronic ulcer of other part of right lower leg with fat layer exposed Quantity: 1 Electronic Signature(s) Signed: 12/24/2022 10:30:44 AM By: Carl Derry PA-C Entered By: [name] on 12/24/2022 10:30:43  JAHLANI, LORENTZ ([id number]) [id number] Page 1 of 9 Visit Report for 12/24/2022 Chief Complaint Document Details Patient Name: Date of Service: ALEXANDROS, EWAN 12/24/2022 7:30 A M Medical Record Number: [id number] Patient Account Number: [id number] Date of Birth/Sex: Treating RN: 10/22/1960 (62 y.o. [name] Primary Care Provider: Tillman [name] Other Clinician: Referring Provider: Treating Provider/Extender: [name] in Treatment: 2 Information Obtained from: Patient Chief Complaint Bilateral LE Ulcers Electronic Signature(s) Signed: 12/24/2022 7:57:36 AM By: Carl Derry PA-C Entered By: [name] on 12/24/2022 07:57:35 -------------------------------------------------------------------------------- HPI Details Patient Name: Date of Service: [name]. 12/24/2022 7:30 A M Medical Record Number: [id number] Patient Account Number: [id number] Date of Birth/Sex: Treating RN: [birth year] (62 y.o. [name] Primary Care Provider: Tillman [name] Other Clinician: Referring Provider: Treating Provider/Extender: [name] in Treatment: 2 History of Present Illness Location: left anterior shin and left medial calf Quality: Patient reports experiencing a dull pain to affected area(s). Severity: Patient states wound are getting worse. Duration: Patient has had the wound for > 3 months prior to seeking treatment at the wound center Timing: Pain in wound is Intermittent (comes and goes Context: The wound appeared gradually over time Modifying Factors: Other treatment(s) tried include:had surgery on the left anterior shin for a hematoma which has been nonhealing for the last 8 months ssociated Signs and Symptoms: Patient reports having increase swelling. A HPI Description: 62 year old diabetic was last hemoglobin A1c was 7.2% has been reviewed today with 2 ulcerated areas in his left lower  extremity which she's had for about 8 months and the one most recently was started as a blister for 2 weeks. he has a past medical history of atrial fibrillation, coronary artery disease, CHF, chronic venous stasis dermatitis, diabetes mellitus, hypertension, sleep apnea and is status post kidney transplant in 2012. He is a former smoker and quit cigars in 1996. In the remote past he's had some arterial and venous duplex studies done but does not recall the results no was ever treated for venous insufficiency. 06/10/2016 -- -- had a office visit at [facility] seen by the physicians assistant Cleda Daub. Patient underwent an ABI which showed medial calcification, normal tibial artery Doppler waveform, PPG waveforms and toe brachial indices suggest normal arterial perfusion to bilateral lower extremities. right TBI was 0.97 and left was 0.84 SAKET, HELLSTROM [name] ([id number]) [id number] Page 2 of 9 Bilateral venous duplex was notable for venous reflux in the left greater saphenous vein and popliteal veins.. A left lower extremity endovenous laser ablation was recommended. 06/27/2016 -- I understand he spoke to his insurance company and there is a 3 month waiting period with conservative therapy prior to authorizing surgical intervention. 07/12/16- he is here for follow-up evaluation of a left lower extremity ulcer. He did bring his compression stockings today. He is voicing no complaints or concerns Readmission: 04/16/2021 upon evaluation today patient appears for reevaluation here in the clinic he was last seen in April 2018. At that time he was having issues with venous stasis ulcers and that again is what he is dealing with today as well. Fortunately I do not see any signs of active infection at this time which is great news and overall very pleased in that regard. Nonetheless he does have some areas of eschar which is going require debridement to clear  away some of the necrotic debris so that we can hopefully get this healed as quickly as possible. That was

## 2022-12-26 ENCOUNTER — Telehealth: Payer: Self-pay | Admitting: Internal Medicine

## 2022-12-26 NOTE — Telephone Encounter (Signed)
Patient called in and wanted to speak with Carollee Herter regarding his FMLA paperwork. He stated that his job hasn't received anything yet. Thank you!

## 2022-12-26 NOTE — Telephone Encounter (Signed)
Spoke to pt. Advised him that I faxed it late yesterday afternoon. I received a confirmation, also.

## 2022-12-31 ENCOUNTER — Encounter: Payer: Commercial Managed Care - PPO | Admitting: Physician Assistant

## 2022-12-31 DIAGNOSIS — I132 Hypertensive heart and chronic kidney disease with heart failure and with stage 5 chronic kidney disease, or end stage renal disease: Secondary | ICD-10-CM | POA: Diagnosis not present

## 2022-12-31 DIAGNOSIS — S81812A Laceration without foreign body, left lower leg, initial encounter: Secondary | ICD-10-CM | POA: Diagnosis not present

## 2022-12-31 DIAGNOSIS — Z87891 Personal history of nicotine dependence: Secondary | ICD-10-CM | POA: Diagnosis not present

## 2022-12-31 DIAGNOSIS — E1122 Type 2 diabetes mellitus with diabetic chronic kidney disease: Secondary | ICD-10-CM | POA: Diagnosis not present

## 2022-12-31 DIAGNOSIS — S81811A Laceration without foreign body, right lower leg, initial encounter: Secondary | ICD-10-CM | POA: Diagnosis not present

## 2022-12-31 DIAGNOSIS — I87333 Chronic venous hypertension (idiopathic) with ulcer and inflammation of bilateral lower extremity: Secondary | ICD-10-CM | POA: Diagnosis not present

## 2022-12-31 DIAGNOSIS — L97822 Non-pressure chronic ulcer of other part of left lower leg with fat layer exposed: Secondary | ICD-10-CM | POA: Diagnosis not present

## 2022-12-31 DIAGNOSIS — I5042 Chronic combined systolic (congestive) and diastolic (congestive) heart failure: Secondary | ICD-10-CM | POA: Diagnosis not present

## 2022-12-31 DIAGNOSIS — N186 End stage renal disease: Secondary | ICD-10-CM | POA: Diagnosis not present

## 2022-12-31 DIAGNOSIS — I890 Lymphedema, not elsewhere classified: Secondary | ICD-10-CM | POA: Diagnosis not present

## 2022-12-31 DIAGNOSIS — L97812 Non-pressure chronic ulcer of other part of right lower leg with fat layer exposed: Secondary | ICD-10-CM | POA: Diagnosis not present

## 2022-12-31 DIAGNOSIS — E11622 Type 2 diabetes mellitus with other skin ulcer: Secondary | ICD-10-CM | POA: Diagnosis not present

## 2022-12-31 NOTE — Progress Notes (Addendum)
Kealohilani Maiorino PA-C Entered By: [name] on 12/31/2022 07:57:19 -------------------------------------------------------------------------------- Progress Note Details Patient Name: Date of Service: GAYLAND, NICOL 12/31/2022 7:30 A M Medical Record Number: [id number] Patient Account Number: [id number] Date of Birth/Sex: Treating RN: 09/07/1960 (62 y.o. [name] Primary Care Provider: [name] Other Clinician: Referring Provider: Treating Provider/Extender: Derrek Monaco in Treatment: 3 Lattie, Cervi [place] F ([id number]) [id number] Page 7 of 10 Subjective Chief Complaint Information obtained from Patient Bilateral LE Ulcers History of Present Illness (HPI) 62 year old diabetic was last hemoglobin A1c was 7.2% has been reviewed today with 2 ulcerated areas in his left lower extremity which she's had for about 8 months and the one most recently was started as a blister for 2 weeks. he has a past medical history of atrial fibrillation, coronary artery disease, CHF, chronic venous stasis dermatitis, diabetes mellitus, hypertension, sleep apnea and is status post kidney transplant in 2012. He is a former smoker and quit cigars in 1996. In the remote past he's had some arterial and venous duplex studies done but does not recall the results no was ever treated for venous insufficiency. 06/10/2016 -- -- had a office visit at [facility] seen by the physicians assistant Cleda Daub. Patient underwent an ABI which showed medial calcification, normal tibial artery Doppler waveform, PPG waveforms and toe brachial indices suggest normal arterial perfusion to bilateral lower extremities. right TBI was 0.97 and left was 0.84 Bilateral venous duplex was notable for venous reflux in the left greater saphenous vein and popliteal veins.. A left lower extremity endovenous laser  ablation was recommended. 06/27/2016 -- I understand he spoke to his insurance company and there is a 3 month waiting period with conservative therapy prior to authorizing surgical intervention. 07/12/16- he is here for follow-up evaluation of a left lower extremity ulcer. He did bring his compression stockings today. He is voicing no complaints or concerns Readmission: 04/16/2021 upon evaluation today patient appears for reevaluation here in the clinic he was last seen in April 2018. At that time he was having issues with venous stasis ulcers and that again is what he is dealing with today as well. Fortunately I do not see any signs of active infection at this time which is great news and overall very pleased in that regard. Nonetheless he does have some areas of eschar which is going require debridement to clear away some of the necrotic debris so that we can hopefully get this healed as quickly as possible. That was discussed with the patient today as well. The patient does have diabetes mellitus type 2 for which she does have a hemoglobin A1c of 7.27 November 2020. He also has been on Keflex for what was felt to be cellulitis that seems to be doing better these wounds began on March 08, 2021. The patient also has a history of lymphedema, chronic venous insufficiency, and is a kidney transplant patient. 04/24/2021 upon evaluation today patient appears to be doing okay in regard to his wounds. Fortunately there does not appear to be any evidence of active infection locally nor systemically at this time which is good news. Nonetheless he was not able to keep the compression wraps on I think this is going be a little bit more problem he tells me his anxiety was to the point he just was not able to do it. I do understand but at the same time that is good to make things somewhat difficult to be honest. 04/30/2021 upon evaluation today  to his wounds which are actually showing signs of excellent improvement. Fortunately I do not see any signs of infection at this time which is great news in general I think is very close to resolution. Electronic Signature(s) Signed: 12/31/2022 8:03:13 AM By: [name] PA-C Entered By: [name] on 12/31/2022 08:03:13 -------------------------------------------------------------------------------- Physical Exam Details Patient Name: Date of Service: MUJAHID, JALOMO F.  12/31/2022 7:30 A M Medical Record Number: [id number] Patient Account Number: [id number] Date of Birth/Sex: Treating RN: 1960/12/08 (62 y.o. [name] Primary Care Provider: [name] Other Clinician: Referring Provider: Treating Provider/Extender: Derrek Monaco in Treatment: 3 Constitutional Well-nourished and well-hydrated in no acute distress. Respiratory normal breathing without difficulty. Psychiatric this patient is able to make decisions and demonstrates good insight into disease process. Alert and Oriented x 3. pleasant and cooperative. TADD, HOLTMEYER ([id number]) [id number] Page 4 of 10 Notes Patient's wounds all appear to be doing excellent at this point. I am actually very pleased with where we stand I do believe that he is making good headway complete closure which is excellent news as well. towards no fevers, chills, nausea, vomiting, or diarrhea. No sharp debridement was necessary today. Electronic Signature(s) Signed: 12/31/2022 8:03:28 AM By: [name] PA-C Entered By: [name] on 12/31/2022 08:03:28 -------------------------------------------------------------------------------- Physician Orders Details Patient Name: Date of Service: [name] 12/31/2022 7:30 A M Medical Record Number: [id number] Patient Account Number: [id number] Date of Birth/Sex: Treating RN: 06-27-1960 (62 y.o. [name] Primary Care Provider: [name] Other Clinician: Referring Provider: Treating Provider/Extender: Derrek Monaco in Treatment: 3 Verbal / Phone Orders: No Diagnosis Coding ICD-10 Coding Code Description [phone] Chronic venous hypertension (idiopathic) with ulcer and inflammation of bilateral lower extremity I89.0 Lymphedema, not elsewhere classified E11.622 Type 2 diabetes mellitus with other skin ulcer L97.812 Non-pressure chronic ulcer of other part of right lower leg  with fat layer exposed S81.812A Laceration without foreign body, left lower leg, initial encounter L97.822 Non-pressure chronic ulcer of other part of left lower leg with fat layer exposed Z94.0 Kidney transplant status I10 Essential (primary) hypertension I50.42 Chronic combined systolic (congestive) and diastolic (congestive) heart failure I48.0 Paroxysmal atrial fibrillation Z79.01 Long term (current) use of anticoagulants Follow-up Appointments Return Appointment in 1 week. Bathing/ Shower/ Hygiene May shower; gently cleanse wound with antibacterial soap, rinse and pat dry prior to dressing wounds No tub bath. Anesthetic (Use 'Patient Medications' Section for Anesthetic Order Entry) Lidocaine applied to wound bed Edema Control - Lymphedema / Segmental Compressive Device / Other Patient to wear own compression stockings. Remove compression stockings every night before going to bed and put on every morning when getting up. Elevate, Exercise Daily and A void Standing for Long Periods of Time. Elevate leg(s) parallel to the floor when sitting. DO YOUR BEST to sleep in the bed at night. DO NOT sleep in your recliner. Long hours of sitting in a recliner leads to swelling of the legs and/or potential wounds on your backside. Wound Treatment Wound #8 - Lower Leg Wound Laterality: Left, Circumferential Cleanser: Soap and Water 1 x Per Day/30 Days Discharge Instructions: Gently cleanse wound with antibacterial soap, rinse and pat dry prior to dressing wounds Cleanser: Wound Cleanser 1 x Per Day/30 Days Discharge Instructions: Wash your hands with soap and water. Remove old dressing, discard into plastic bag and place into trash. Cleanse the wound with Wound Cleanser prior to applying a clean dressing using gauze sponges, not tissues or cotton  Kealohilani Maiorino PA-C Entered By: [name] on 12/31/2022 07:57:19 -------------------------------------------------------------------------------- Progress Note Details Patient Name: Date of Service: GAYLAND, NICOL 12/31/2022 7:30 A M Medical Record Number: [id number] Patient Account Number: [id number] Date of Birth/Sex: Treating RN: 09/07/1960 (62 y.o. [name] Primary Care Provider: [name] Other Clinician: Referring Provider: Treating Provider/Extender: Derrek Monaco in Treatment: 3 Lattie, Cervi [place] F ([id number]) [id number] Page 7 of 10 Subjective Chief Complaint Information obtained from Patient Bilateral LE Ulcers History of Present Illness (HPI) 62 year old diabetic was last hemoglobin A1c was 7.2% has been reviewed today with 2 ulcerated areas in his left lower extremity which she's had for about 8 months and the one most recently was started as a blister for 2 weeks. he has a past medical history of atrial fibrillation, coronary artery disease, CHF, chronic venous stasis dermatitis, diabetes mellitus, hypertension, sleep apnea and is status post kidney transplant in 2012. He is a former smoker and quit cigars in 1996. In the remote past he's had some arterial and venous duplex studies done but does not recall the results no was ever treated for venous insufficiency. 06/10/2016 -- -- had a office visit at [facility] seen by the physicians assistant Cleda Daub. Patient underwent an ABI which showed medial calcification, normal tibial artery Doppler waveform, PPG waveforms and toe brachial indices suggest normal arterial perfusion to bilateral lower extremities. right TBI was 0.97 and left was 0.84 Bilateral venous duplex was notable for venous reflux in the left greater saphenous vein and popliteal veins.. A left lower extremity endovenous laser  ablation was recommended. 06/27/2016 -- I understand he spoke to his insurance company and there is a 3 month waiting period with conservative therapy prior to authorizing surgical intervention. 07/12/16- he is here for follow-up evaluation of a left lower extremity ulcer. He did bring his compression stockings today. He is voicing no complaints or concerns Readmission: 04/16/2021 upon evaluation today patient appears for reevaluation here in the clinic he was last seen in April 2018. At that time he was having issues with venous stasis ulcers and that again is what he is dealing with today as well. Fortunately I do not see any signs of active infection at this time which is great news and overall very pleased in that regard. Nonetheless he does have some areas of eschar which is going require debridement to clear away some of the necrotic debris so that we can hopefully get this healed as quickly as possible. That was discussed with the patient today as well. The patient does have diabetes mellitus type 2 for which she does have a hemoglobin A1c of 7.27 November 2020. He also has been on Keflex for what was felt to be cellulitis that seems to be doing better these wounds began on March 08, 2021. The patient also has a history of lymphedema, chronic venous insufficiency, and is a kidney transplant patient. 04/24/2021 upon evaluation today patient appears to be doing okay in regard to his wounds. Fortunately there does not appear to be any evidence of active infection locally nor systemically at this time which is good news. Nonetheless he was not able to keep the compression wraps on I think this is going be a little bit more problem he tells me his anxiety was to the point he just was not able to do it. I do understand but at the same time that is good to make things somewhat difficult to be honest. 04/30/2021 upon evaluation today  balls. Do not scrub or use excessive ESTANISLAO, HARMON ([id number]) [id number] Page 5 of 10 force. Pat dry using gauze  sponges, not tissue or cotton balls. Prim Dressing: Promogran Matrix 4.34 (in) (DME) (Generic) 1 x Per Day/30 Days ary Discharge Instructions: only on lower left leg wound Moisten w/normal saline or sterile water; Cover wound as directed. Do not remove from wound bed. Prim Dressing: Xeroform 5x9-HBD (in/in) (Generic) 1 x Per Day/30 Days ary Discharge Instructions: Apply Xeroform 5x9-HBD (in/in) as directed Secondary Dressing: ABD Pad 5x9 (in/in) (Generic) 1 x Per Day/30 Days Discharge Instructions: Cover with ABD pad Secured With: Medipore T - 12M Medipore H Soft Cloth Surgical T ape ape, 2x2 (in/yd) (Generic) 1 x Per Day/30 Days Secured With: Kerlix Roll Sterile or Non-Sterile 6-ply 4.5x4 (yd/yd) (Generic) 1 x Per Day/30 Days Discharge Instructions: Apply Kerlix as directed Wound #9 - Lower Leg Wound Laterality: Right, Medial Cleanser: Soap and Water 1 x Per Day/30 Days Discharge Instructions: Gently cleanse wound with antibacterial soap, rinse and pat dry prior to dressing wounds Cleanser: Wound Cleanser 1 x Per Day/30 Days Discharge Instructions: Wash your hands with soap and water. Remove old dressing, discard into plastic bag and place into trash. Cleanse the wound with Wound Cleanser prior to applying a clean dressing using gauze sponges, not tissues or cotton balls. Do not scrub or use excessive force. Pat dry using gauze sponges, not tissue or cotton balls. Prim Dressing: Promogran Matrix 4.34 (in) (DME) (Generic) 1 x Per Day/30 Days ary Discharge Instructions: Moisten w/normal saline or sterile water; Cover wound as directed. Do not remove from wound bed. Prim Dressing: Xeroform-HBD 2x2 (in/in) 1 x Per Day/30 Days ary Discharge Instructions: Apply Xeroform-HBD 2x2 (in/in) as directed Secondary Dressing: ABD Pad 5x9 (in/in) (Generic) 1 x Per Day/30 Days Discharge Instructions: Cover with ABD pad Secured With: Medipore T - 12M Medipore H Soft Cloth Surgical T ape ape, 2x2 (in/yd)  (Generic) 1 x Per Day/30 Days Secured With: Kerlix Roll Sterile or Non-Sterile 6-ply 4.5x4 (yd/yd) (Generic) 1 x Per Day/30 Days Discharge Instructions: Apply Kerlix as directed Electronic Signature(s) Signed: 12/31/2022 11:40:05 AM By: [name] PA-C Signed: 12/31/2022 4:29:50 PM By: [name] Entered By: [name] on 12/31/2022 08:15:10 -------------------------------------------------------------------------------- Problem List Details Patient Name: Date of Service: [name] 12/31/2022 7:30 A M Medical Record Number: [id number] Patient Account Number: [id number] Date of Birth/Sex: Treating RN: 1960-03-29 (62 y.o. [name] Primary Care Provider: [name] Other Clinician: Referring Provider: Treating Provider/Extender: Derrek Monaco in Treatment: 3 Active Problems ICD-10 Encounter Code Description Active Date MDM Diagnosis I87.333 Chronic venous hypertension (idiopathic) with ulcer and inflammation of 12/05/2022 No Yes CASS, VANDERMEULEN ([id number]) [id number] Page 6 of 10 bilateral lower extremity I89.0 Lymphedema, not elsewhere classified 12/05/2022 No Yes E11.622 Type 2 diabetes mellitus with other skin ulcer 12/05/2022 No Yes L97.812 Non-pressure chronic ulcer of other part of right lower leg with fat layer 12/05/2022 No Yes exposed S81.812A Laceration without foreign body, left lower leg, initial encounter 12/05/2022 No Yes L97.822 Non-pressure chronic ulcer of other part of left lower leg with fat layer exposed9/02/2023 No Yes Z94.0 Kidney transplant status 12/05/2022 No Yes I10 Essential (primary) hypertension 12/05/2022 No Yes I50.42 Chronic combined systolic (congestive) and diastolic (congestive) heart failure 12/05/2022 No Yes I48.0 Paroxysmal atrial fibrillation 12/05/2022 No Yes Z79.01 Long term (current) use of anticoagulants 12/05/2022 No Yes Inactive Problems Resolved Problems Electronic  Signature(s) Signed: 12/31/2022 7:57:19 AM By: Larina Bras,  Kealohilani Maiorino PA-C Entered By: [name] on 12/31/2022 07:57:19 -------------------------------------------------------------------------------- Progress Note Details Patient Name: Date of Service: GAYLAND, NICOL 12/31/2022 7:30 A M Medical Record Number: [id number] Patient Account Number: [id number] Date of Birth/Sex: Treating RN: 09/07/1960 (62 y.o. [name] Primary Care Provider: [name] Other Clinician: Referring Provider: Treating Provider/Extender: Derrek Monaco in Treatment: 3 Lattie, Cervi [place] F ([id number]) [id number] Page 7 of 10 Subjective Chief Complaint Information obtained from Patient Bilateral LE Ulcers History of Present Illness (HPI) 62 year old diabetic was last hemoglobin A1c was 7.2% has been reviewed today with 2 ulcerated areas in his left lower extremity which she's had for about 8 months and the one most recently was started as a blister for 2 weeks. he has a past medical history of atrial fibrillation, coronary artery disease, CHF, chronic venous stasis dermatitis, diabetes mellitus, hypertension, sleep apnea and is status post kidney transplant in 2012. He is a former smoker and quit cigars in 1996. In the remote past he's had some arterial and venous duplex studies done but does not recall the results no was ever treated for venous insufficiency. 06/10/2016 -- -- had a office visit at [facility] seen by the physicians assistant Cleda Daub. Patient underwent an ABI which showed medial calcification, normal tibial artery Doppler waveform, PPG waveforms and toe brachial indices suggest normal arterial perfusion to bilateral lower extremities. right TBI was 0.97 and left was 0.84 Bilateral venous duplex was notable for venous reflux in the left greater saphenous vein and popliteal veins.. A left lower extremity endovenous laser  ablation was recommended. 06/27/2016 -- I understand he spoke to his insurance company and there is a 3 month waiting period with conservative therapy prior to authorizing surgical intervention. 07/12/16- he is here for follow-up evaluation of a left lower extremity ulcer. He did bring his compression stockings today. He is voicing no complaints or concerns Readmission: 04/16/2021 upon evaluation today patient appears for reevaluation here in the clinic he was last seen in April 2018. At that time he was having issues with venous stasis ulcers and that again is what he is dealing with today as well. Fortunately I do not see any signs of active infection at this time which is great news and overall very pleased in that regard. Nonetheless he does have some areas of eschar which is going require debridement to clear away some of the necrotic debris so that we can hopefully get this healed as quickly as possible. That was discussed with the patient today as well. The patient does have diabetes mellitus type 2 for which she does have a hemoglobin A1c of 7.27 November 2020. He also has been on Keflex for what was felt to be cellulitis that seems to be doing better these wounds began on March 08, 2021. The patient also has a history of lymphedema, chronic venous insufficiency, and is a kidney transplant patient. 04/24/2021 upon evaluation today patient appears to be doing okay in regard to his wounds. Fortunately there does not appear to be any evidence of active infection locally nor systemically at this time which is good news. Nonetheless he was not able to keep the compression wraps on I think this is going be a little bit more problem he tells me his anxiety was to the point he just was not able to do it. I do understand but at the same time that is good to make things somewhat difficult to be honest. 04/30/2021 upon evaluation today  to his wounds which are actually showing signs of excellent improvement. Fortunately I do not see any signs of infection at this time which is great news in general I think is very close to resolution. Electronic Signature(s) Signed: 12/31/2022 8:03:13 AM By: [name] PA-C Entered By: [name] on 12/31/2022 08:03:13 -------------------------------------------------------------------------------- Physical Exam Details Patient Name: Date of Service: MUJAHID, JALOMO F.  12/31/2022 7:30 A M Medical Record Number: [id number] Patient Account Number: [id number] Date of Birth/Sex: Treating RN: 1960/12/08 (62 y.o. [name] Primary Care Provider: [name] Other Clinician: Referring Provider: Treating Provider/Extender: Derrek Monaco in Treatment: 3 Constitutional Well-nourished and well-hydrated in no acute distress. Respiratory normal breathing without difficulty. Psychiatric this patient is able to make decisions and demonstrates good insight into disease process. Alert and Oriented x 3. pleasant and cooperative. TADD, HOLTMEYER ([id number]) [id number] Page 4 of 10 Notes Patient's wounds all appear to be doing excellent at this point. I am actually very pleased with where we stand I do believe that he is making good headway complete closure which is excellent news as well. towards no fevers, chills, nausea, vomiting, or diarrhea. No sharp debridement was necessary today. Electronic Signature(s) Signed: 12/31/2022 8:03:28 AM By: [name] PA-C Entered By: [name] on 12/31/2022 08:03:28 -------------------------------------------------------------------------------- Physician Orders Details Patient Name: Date of Service: [name] 12/31/2022 7:30 A M Medical Record Number: [id number] Patient Account Number: [id number] Date of Birth/Sex: Treating RN: 06-27-1960 (62 y.o. [name] Primary Care Provider: [name] Other Clinician: Referring Provider: Treating Provider/Extender: Derrek Monaco in Treatment: 3 Verbal / Phone Orders: No Diagnosis Coding ICD-10 Coding Code Description [phone] Chronic venous hypertension (idiopathic) with ulcer and inflammation of bilateral lower extremity I89.0 Lymphedema, not elsewhere classified E11.622 Type 2 diabetes mellitus with other skin ulcer L97.812 Non-pressure chronic ulcer of other part of right lower leg  with fat layer exposed S81.812A Laceration without foreign body, left lower leg, initial encounter L97.822 Non-pressure chronic ulcer of other part of left lower leg with fat layer exposed Z94.0 Kidney transplant status I10 Essential (primary) hypertension I50.42 Chronic combined systolic (congestive) and diastolic (congestive) heart failure I48.0 Paroxysmal atrial fibrillation Z79.01 Long term (current) use of anticoagulants Follow-up Appointments Return Appointment in 1 week. Bathing/ Shower/ Hygiene May shower; gently cleanse wound with antibacterial soap, rinse and pat dry prior to dressing wounds No tub bath. Anesthetic (Use 'Patient Medications' Section for Anesthetic Order Entry) Lidocaine applied to wound bed Edema Control - Lymphedema / Segmental Compressive Device / Other Patient to wear own compression stockings. Remove compression stockings every night before going to bed and put on every morning when getting up. Elevate, Exercise Daily and A void Standing for Long Periods of Time. Elevate leg(s) parallel to the floor when sitting. DO YOUR BEST to sleep in the bed at night. DO NOT sleep in your recliner. Long hours of sitting in a recliner leads to swelling of the legs and/or potential wounds on your backside. Wound Treatment Wound #8 - Lower Leg Wound Laterality: Left, Circumferential Cleanser: Soap and Water 1 x Per Day/30 Days Discharge Instructions: Gently cleanse wound with antibacterial soap, rinse and pat dry prior to dressing wounds Cleanser: Wound Cleanser 1 x Per Day/30 Days Discharge Instructions: Wash your hands with soap and water. Remove old dressing, discard into plastic bag and place into trash. Cleanse the wound with Wound Cleanser prior to applying a clean dressing using gauze sponges, not tissues or cotton  to his wounds which are actually showing signs of excellent improvement. Fortunately I do not see any signs of infection at this time which is great news in general I think is very close to resolution. Electronic Signature(s) Signed: 12/31/2022 8:03:13 AM By: [name] PA-C Entered By: [name] on 12/31/2022 08:03:13 -------------------------------------------------------------------------------- Physical Exam Details Patient Name: Date of Service: MUJAHID, JALOMO F.  12/31/2022 7:30 A M Medical Record Number: [id number] Patient Account Number: [id number] Date of Birth/Sex: Treating RN: 1960/12/08 (62 y.o. [name] Primary Care Provider: [name] Other Clinician: Referring Provider: Treating Provider/Extender: Derrek Monaco in Treatment: 3 Constitutional Well-nourished and well-hydrated in no acute distress. Respiratory normal breathing without difficulty. Psychiatric this patient is able to make decisions and demonstrates good insight into disease process. Alert and Oriented x 3. pleasant and cooperative. TADD, HOLTMEYER ([id number]) [id number] Page 4 of 10 Notes Patient's wounds all appear to be doing excellent at this point. I am actually very pleased with where we stand I do believe that he is making good headway complete closure which is excellent news as well. towards no fevers, chills, nausea, vomiting, or diarrhea. No sharp debridement was necessary today. Electronic Signature(s) Signed: 12/31/2022 8:03:28 AM By: [name] PA-C Entered By: [name] on 12/31/2022 08:03:28 -------------------------------------------------------------------------------- Physician Orders Details Patient Name: Date of Service: [name] 12/31/2022 7:30 A M Medical Record Number: [id number] Patient Account Number: [id number] Date of Birth/Sex: Treating RN: 06-27-1960 (62 y.o. [name] Primary Care Provider: [name] Other Clinician: Referring Provider: Treating Provider/Extender: Derrek Monaco in Treatment: 3 Verbal / Phone Orders: No Diagnosis Coding ICD-10 Coding Code Description [phone] Chronic venous hypertension (idiopathic) with ulcer and inflammation of bilateral lower extremity I89.0 Lymphedema, not elsewhere classified E11.622 Type 2 diabetes mellitus with other skin ulcer L97.812 Non-pressure chronic ulcer of other part of right lower leg  with fat layer exposed S81.812A Laceration without foreign body, left lower leg, initial encounter L97.822 Non-pressure chronic ulcer of other part of left lower leg with fat layer exposed Z94.0 Kidney transplant status I10 Essential (primary) hypertension I50.42 Chronic combined systolic (congestive) and diastolic (congestive) heart failure I48.0 Paroxysmal atrial fibrillation Z79.01 Long term (current) use of anticoagulants Follow-up Appointments Return Appointment in 1 week. Bathing/ Shower/ Hygiene May shower; gently cleanse wound with antibacterial soap, rinse and pat dry prior to dressing wounds No tub bath. Anesthetic (Use 'Patient Medications' Section for Anesthetic Order Entry) Lidocaine applied to wound bed Edema Control - Lymphedema / Segmental Compressive Device / Other Patient to wear own compression stockings. Remove compression stockings every night before going to bed and put on every morning when getting up. Elevate, Exercise Daily and A void Standing for Long Periods of Time. Elevate leg(s) parallel to the floor when sitting. DO YOUR BEST to sleep in the bed at night. DO NOT sleep in your recliner. Long hours of sitting in a recliner leads to swelling of the legs and/or potential wounds on your backside. Wound Treatment Wound #8 - Lower Leg Wound Laterality: Left, Circumferential Cleanser: Soap and Water 1 x Per Day/30 Days Discharge Instructions: Gently cleanse wound with antibacterial soap, rinse and pat dry prior to dressing wounds Cleanser: Wound Cleanser 1 x Per Day/30 Days Discharge Instructions: Wash your hands with soap and water. Remove old dressing, discard into plastic bag and place into trash. Cleanse the wound with Wound Cleanser prior to applying a clean dressing using gauze sponges, not tissues or cotton  Kealohilani Maiorino PA-C Entered By: [name] on 12/31/2022 07:57:19 -------------------------------------------------------------------------------- Progress Note Details Patient Name: Date of Service: GAYLAND, NICOL 12/31/2022 7:30 A M Medical Record Number: [id number] Patient Account Number: [id number] Date of Birth/Sex: Treating RN: 09/07/1960 (62 y.o. [name] Primary Care Provider: [name] Other Clinician: Referring Provider: Treating Provider/Extender: Derrek Monaco in Treatment: 3 Lattie, Cervi [place] F ([id number]) [id number] Page 7 of 10 Subjective Chief Complaint Information obtained from Patient Bilateral LE Ulcers History of Present Illness (HPI) 62 year old diabetic was last hemoglobin A1c was 7.2% has been reviewed today with 2 ulcerated areas in his left lower extremity which she's had for about 8 months and the one most recently was started as a blister for 2 weeks. he has a past medical history of atrial fibrillation, coronary artery disease, CHF, chronic venous stasis dermatitis, diabetes mellitus, hypertension, sleep apnea and is status post kidney transplant in 2012. He is a former smoker and quit cigars in 1996. In the remote past he's had some arterial and venous duplex studies done but does not recall the results no was ever treated for venous insufficiency. 06/10/2016 -- -- had a office visit at [facility] seen by the physicians assistant Cleda Daub. Patient underwent an ABI which showed medial calcification, normal tibial artery Doppler waveform, PPG waveforms and toe brachial indices suggest normal arterial perfusion to bilateral lower extremities. right TBI was 0.97 and left was 0.84 Bilateral venous duplex was notable for venous reflux in the left greater saphenous vein and popliteal veins.. A left lower extremity endovenous laser  ablation was recommended. 06/27/2016 -- I understand he spoke to his insurance company and there is a 3 month waiting period with conservative therapy prior to authorizing surgical intervention. 07/12/16- he is here for follow-up evaluation of a left lower extremity ulcer. He did bring his compression stockings today. He is voicing no complaints or concerns Readmission: 04/16/2021 upon evaluation today patient appears for reevaluation here in the clinic he was last seen in April 2018. At that time he was having issues with venous stasis ulcers and that again is what he is dealing with today as well. Fortunately I do not see any signs of active infection at this time which is great news and overall very pleased in that regard. Nonetheless he does have some areas of eschar which is going require debridement to clear away some of the necrotic debris so that we can hopefully get this healed as quickly as possible. That was discussed with the patient today as well. The patient does have diabetes mellitus type 2 for which she does have a hemoglobin A1c of 7.27 November 2020. He also has been on Keflex for what was felt to be cellulitis that seems to be doing better these wounds began on March 08, 2021. The patient also has a history of lymphedema, chronic venous insufficiency, and is a kidney transplant patient. 04/24/2021 upon evaluation today patient appears to be doing okay in regard to his wounds. Fortunately there does not appear to be any evidence of active infection locally nor systemically at this time which is good news. Nonetheless he was not able to keep the compression wraps on I think this is going be a little bit more problem he tells me his anxiety was to the point he just was not able to do it. I do understand but at the same time that is good to make things somewhat difficult to be honest. 04/30/2021 upon evaluation today  to his wounds which are actually showing signs of excellent improvement. Fortunately I do not see any signs of infection at this time which is great news in general I think is very close to resolution. Electronic Signature(s) Signed: 12/31/2022 8:03:13 AM By: [name] PA-C Entered By: [name] on 12/31/2022 08:03:13 -------------------------------------------------------------------------------- Physical Exam Details Patient Name: Date of Service: MUJAHID, JALOMO F.  12/31/2022 7:30 A M Medical Record Number: [id number] Patient Account Number: [id number] Date of Birth/Sex: Treating RN: 1960/12/08 (62 y.o. [name] Primary Care Provider: [name] Other Clinician: Referring Provider: Treating Provider/Extender: Derrek Monaco in Treatment: 3 Constitutional Well-nourished and well-hydrated in no acute distress. Respiratory normal breathing without difficulty. Psychiatric this patient is able to make decisions and demonstrates good insight into disease process. Alert and Oriented x 3. pleasant and cooperative. TADD, HOLTMEYER ([id number]) [id number] Page 4 of 10 Notes Patient's wounds all appear to be doing excellent at this point. I am actually very pleased with where we stand I do believe that he is making good headway complete closure which is excellent news as well. towards no fevers, chills, nausea, vomiting, or diarrhea. No sharp debridement was necessary today. Electronic Signature(s) Signed: 12/31/2022 8:03:28 AM By: [name] PA-C Entered By: [name] on 12/31/2022 08:03:28 -------------------------------------------------------------------------------- Physician Orders Details Patient Name: Date of Service: [name] 12/31/2022 7:30 A M Medical Record Number: [id number] Patient Account Number: [id number] Date of Birth/Sex: Treating RN: 06-27-1960 (62 y.o. [name] Primary Care Provider: [name] Other Clinician: Referring Provider: Treating Provider/Extender: Derrek Monaco in Treatment: 3 Verbal / Phone Orders: No Diagnosis Coding ICD-10 Coding Code Description [phone] Chronic venous hypertension (idiopathic) with ulcer and inflammation of bilateral lower extremity I89.0 Lymphedema, not elsewhere classified E11.622 Type 2 diabetes mellitus with other skin ulcer L97.812 Non-pressure chronic ulcer of other part of right lower leg  with fat layer exposed S81.812A Laceration without foreign body, left lower leg, initial encounter L97.822 Non-pressure chronic ulcer of other part of left lower leg with fat layer exposed Z94.0 Kidney transplant status I10 Essential (primary) hypertension I50.42 Chronic combined systolic (congestive) and diastolic (congestive) heart failure I48.0 Paroxysmal atrial fibrillation Z79.01 Long term (current) use of anticoagulants Follow-up Appointments Return Appointment in 1 week. Bathing/ Shower/ Hygiene May shower; gently cleanse wound with antibacterial soap, rinse and pat dry prior to dressing wounds No tub bath. Anesthetic (Use 'Patient Medications' Section for Anesthetic Order Entry) Lidocaine applied to wound bed Edema Control - Lymphedema / Segmental Compressive Device / Other Patient to wear own compression stockings. Remove compression stockings every night before going to bed and put on every morning when getting up. Elevate, Exercise Daily and A void Standing for Long Periods of Time. Elevate leg(s) parallel to the floor when sitting. DO YOUR BEST to sleep in the bed at night. DO NOT sleep in your recliner. Long hours of sitting in a recliner leads to swelling of the legs and/or potential wounds on your backside. Wound Treatment Wound #8 - Lower Leg Wound Laterality: Left, Circumferential Cleanser: Soap and Water 1 x Per Day/30 Days Discharge Instructions: Gently cleanse wound with antibacterial soap, rinse and pat dry prior to dressing wounds Cleanser: Wound Cleanser 1 x Per Day/30 Days Discharge Instructions: Wash your hands with soap and water. Remove old dressing, discard into plastic bag and place into trash. Cleanse the wound with Wound Cleanser prior to applying a clean dressing using gauze sponges, not tissues or cotton  RAHEEN, CAPILI ([id number]) [id number] Page 1 of 10 Visit Report for 12/31/2022 Chief Complaint Document Details Patient Name: Date of Service: SHAKA, CARDIN 12/31/2022 7:30 A M Medical Record Number: [id number] Patient Account Number: [id number] Date of Birth/Sex: Treating RN: 08/21/1960 (62 y.o. [name] Primary Care Provider: [name] Other Clinician: Referring Provider: Treating Provider/Extender: Derrek Monaco in Treatment: 3 Information Obtained from: Patient Chief Complaint Bilateral LE Ulcers Electronic Signature(s) Signed: 12/31/2022 7:57:25 AM By: [name] PA-C Entered By: [name] on 12/31/2022 07:57:25 -------------------------------------------------------------------------------- Debridement Details Patient Name: Date of Service: [name] 12/31/2022 7:30 A M Medical Record Number: [id number] Patient Account Number: [id number] Date of Birth/Sex: Treating RN: [birth year] (62 y.o. [name] Primary Care Provider: [name] Other Clinician: Referring Provider: Treating Provider/Extender: Derrek Monaco in Treatment: 3 Debridement Performed for Assessment: Wound #9 Right,Medial Lower Leg Performed By: Physician [name], PA-C Debridement Type: Chemical/Enzymatic/Mechanical Agent Used: saline gauze Level of Consciousness (Pre-procedure): Awake and Alert Pre-procedure Verification/Time Out Yes - 08:05 Taken: Pain Control: Lidocaine 4% Topical Solution Percent of Wound Bed Debrided: Instrument: Other : saline gauze Bleeding: None Response to Treatment: Procedure was tolerated well Level of Consciousness (Post- Awake and Alert procedure): Post Debridement Measurements of Total Wound Length: (cm) 0.3 Width: (cm) 0.7 TYLIEK, TIMBERMAN F ([id number]) [id number] Page 2 of 10 Depth: (cm) 0.1 Volume: (cm) 0.016 Character of  Wound/Ulcer Post Debridement: Stable Post Procedure Diagnosis Same as Pre-procedure Electronic Signature(s) Signed: 12/31/2022 11:40:05 AM By: [name] PA-C Signed: 12/31/2022 4:29:50 PM By: [name] Entered By: [name] on 12/31/2022 08:14:51 -------------------------------------------------------------------------------- HPI Details Patient Name: Date of Service: [name] 12/31/2022 7:30 A M Medical Record Number: [id number] Patient Account Number: [id number] Date of Birth/Sex: Treating RN: 1960/03/27 (62 y.o. [name] Primary Care Provider: [name] Other Clinician: Referring Provider: Treating Provider/Extender: Derrek Monaco in Treatment: 3 History of Present Illness HPI Description: 62 year old diabetic was last hemoglobin A1c was 7.2% has been reviewed today with 2 ulcerated areas in his left lower extremity which she's had for about 8 months and the one most recently was started as a blister for 2 weeks. he has a past medical history of atrial fibrillation, coronary artery disease, CHF, chronic venous stasis dermatitis, diabetes mellitus, hypertension, sleep apnea and is status post kidney transplant in 2012. He is a former smoker and quit cigars in 1996. In the remote past he's had some arterial and venous duplex studies done but does not recall the results no was ever treated for venous insufficiency. 06/10/2016 -- -- had a office visit at [facility] seen by the physicians assistant Cleda Daub. Patient underwent an ABI which showed medial calcification, normal tibial artery Doppler waveform, PPG waveforms and toe brachial indices suggest normal arterial perfusion to bilateral lower extremities. right TBI was 0.97 and left was 0.84 Bilateral venous duplex was notable for venous reflux in the left greater saphenous vein and popliteal veins.. A left lower extremity endovenous laser ablation was  recommended. 06/27/2016 -- I understand he spoke to his insurance company and there is a 3 month waiting period with conservative therapy prior to authorizing surgical intervention. 07/12/16- he is here for follow-up evaluation of a left lower extremity ulcer. He did bring his compression stockings today. He is voicing no complaints or concerns Readmission: 04/16/2021 upon evaluation today patient appears for reevaluation here in the clinic he was last seen in  RAHEEN, CAPILI ([id number]) [id number] Page 1 of 10 Visit Report for 12/31/2022 Chief Complaint Document Details Patient Name: Date of Service: SHAKA, CARDIN 12/31/2022 7:30 A M Medical Record Number: [id number] Patient Account Number: [id number] Date of Birth/Sex: Treating RN: 08/21/1960 (62 y.o. [name] Primary Care Provider: [name] Other Clinician: Referring Provider: Treating Provider/Extender: Derrek Monaco in Treatment: 3 Information Obtained from: Patient Chief Complaint Bilateral LE Ulcers Electronic Signature(s) Signed: 12/31/2022 7:57:25 AM By: [name] PA-C Entered By: [name] on 12/31/2022 07:57:25 -------------------------------------------------------------------------------- Debridement Details Patient Name: Date of Service: [name] 12/31/2022 7:30 A M Medical Record Number: [id number] Patient Account Number: [id number] Date of Birth/Sex: Treating RN: [birth year] (62 y.o. [name] Primary Care Provider: [name] Other Clinician: Referring Provider: Treating Provider/Extender: Derrek Monaco in Treatment: 3 Debridement Performed for Assessment: Wound #9 Right,Medial Lower Leg Performed By: Physician [name], PA-C Debridement Type: Chemical/Enzymatic/Mechanical Agent Used: saline gauze Level of Consciousness (Pre-procedure): Awake and Alert Pre-procedure Verification/Time Out Yes - 08:05 Taken: Pain Control: Lidocaine 4% Topical Solution Percent of Wound Bed Debrided: Instrument: Other : saline gauze Bleeding: None Response to Treatment: Procedure was tolerated well Level of Consciousness (Post- Awake and Alert procedure): Post Debridement Measurements of Total Wound Length: (cm) 0.3 Width: (cm) 0.7 TYLIEK, TIMBERMAN F ([id number]) [id number] Page 2 of 10 Depth: (cm) 0.1 Volume: (cm) 0.016 Character of  Wound/Ulcer Post Debridement: Stable Post Procedure Diagnosis Same as Pre-procedure Electronic Signature(s) Signed: 12/31/2022 11:40:05 AM By: [name] PA-C Signed: 12/31/2022 4:29:50 PM By: [name] Entered By: [name] on 12/31/2022 08:14:51 -------------------------------------------------------------------------------- HPI Details Patient Name: Date of Service: [name] 12/31/2022 7:30 A M Medical Record Number: [id number] Patient Account Number: [id number] Date of Birth/Sex: Treating RN: 1960/03/27 (62 y.o. [name] Primary Care Provider: [name] Other Clinician: Referring Provider: Treating Provider/Extender: Derrek Monaco in Treatment: 3 History of Present Illness HPI Description: 62 year old diabetic was last hemoglobin A1c was 7.2% has been reviewed today with 2 ulcerated areas in his left lower extremity which she's had for about 8 months and the one most recently was started as a blister for 2 weeks. he has a past medical history of atrial fibrillation, coronary artery disease, CHF, chronic venous stasis dermatitis, diabetes mellitus, hypertension, sleep apnea and is status post kidney transplant in 2012. He is a former smoker and quit cigars in 1996. In the remote past he's had some arterial and venous duplex studies done but does not recall the results no was ever treated for venous insufficiency. 06/10/2016 -- -- had a office visit at [facility] seen by the physicians assistant Cleda Daub. Patient underwent an ABI which showed medial calcification, normal tibial artery Doppler waveform, PPG waveforms and toe brachial indices suggest normal arterial perfusion to bilateral lower extremities. right TBI was 0.97 and left was 0.84 Bilateral venous duplex was notable for venous reflux in the left greater saphenous vein and popliteal veins.. A left lower extremity endovenous laser ablation was  recommended. 06/27/2016 -- I understand he spoke to his insurance company and there is a 3 month waiting period with conservative therapy prior to authorizing surgical intervention. 07/12/16- he is here for follow-up evaluation of a left lower extremity ulcer. He did bring his compression stockings today. He is voicing no complaints or concerns Readmission: 04/16/2021 upon evaluation today patient appears for reevaluation here in the clinic he was last seen in

## 2022-12-31 NOTE — Progress Notes (Signed)
heal within 14 weeks Date Initiated: 12/05/2022 Target Resolution Date: 03/13/2023 Goal Status: Active Interventions: Assess patient/caregiver ability to obtain necessary supplies Assess patient/caregiver ability to perform ulcer/skin care regimen upon admission and as needed Assess ulceration(s) every visit Provide education on ulcer and skin care Treatment Activities: Topical wound management initiated : 12/05/2022 Notes: Electronic Signature(s) Signed: 12/31/2022 4:29:50 PM By:  [name] Entered By: [name] on 12/31/2022 08:16:13 [name] ([id number]) [id number] Page 7 of 11 -------------------------------------------------------------------------------- Pain Assessment Details Patient Name: Date of Service: [name], [name] 12/31/2022 7:30 A M Medical Record Number: [id number] Patient Account Number: [id number] Date of Birth/Sex: Treating RN: [name] (62 y.o. [name] Primary Care [name]: [name] Other Clinician: Referring Tayva Easterday: Treating [name]/Extender: [name] in Treatment: 3 Active Problems Location of Pain Severity and Description of Pain Patient Has Paino No Site Locations Rate the pain. Current Pain Level: 0 Pain Management and Medication Current Pain Management: Electronic Signature(s) Signed: 12/31/2022 4:29:50 PM By: [name] Entered By: [name] on 12/31/2022 07:41:12 -------------------------------------------------------------------------------- Patient/Caregiver Education Details Patient Name: Date of Service: [name] 10/8/2024andnbsp7:30 A M Medical Record Number: [id number] Patient Account Number: [id number] Date of Birth/Gender: Treating RN: [name] (62 y.o. [name] Primary Care Physician: [name] Other Clinician: Referring Physician: Treating Physician/Extender: [name] in Treatment: 3 [name] ([id number]) [id number] Page 8 of 11 Education Assessment Education Provided To: Patient Education Topics Provided Wound/Skin Impairment: Handouts: Caring for Your Ulcer Methods: Explain/Verbal Responses: State content correctly Electronic Signature(s) Signed: 12/31/2022 4:29:50 PM By: [name] Entered By: [name] on 12/31/2022  08:16:21 -------------------------------------------------------------------------------- Wound Assessment Details Patient Name: Date of Service: Carl Jain [name]. 12/31/2022 7:30 A M Medical Record Number: [id number] Patient Account Number: [id number] Date of Birth/Sex: Treating RN: [name] (62 y.o. [name] Primary Care [name]: [name] Other Clinician: Referring [name]: Treating [name]/Extender: [name] in Treatment: 3 Wound Status Wound Number: 8 Primary Skin T ear Etiology: Wound Location: Left, Circumferential Lower Leg Wound Open Wounding Event: Skin Tear/Laceration Status: Date Acquired: 11/30/2022 Comorbid Cataracts, Anemia, Lymphedema, Asthma, Sleep Apnea, Weeks Of Treatment: 3 History: Arrhythmia, Congestive Heart Failure, Coronary Artery Disease, Clustered Wound: No Hypertension, Type II Diabetes, End Stage Renal Disease, Gout, Osteoarthritis, Neuropathy Photos Wound Measurements Length: (cm) 5.2 Width: (cm) 3.5 Depth: (cm) 0.1 Area: (cm) 14.294 Volume: (cm) 1.429 % Reduction in Area: 89.1% % Reduction in Volume: 89.1% Epithelialization: Small (1-33%) Tunneling: No Undermining: No Wound Description Classification: Full Thickness Without Exposed Support Structures Exudate Amount: Medium [name], [name] ([id number]) Exudate Type: Serosanguineous Exudate Color: red, brown Foul Odor After Cleansing: No Slough/Fibrino Yes [phone].pdf Page 9 of 11 Wound Bed Granulation Amount: Large (67-100%) Exposed Structure Granulation Quality: Red, Pink Fat Layer (Subcutaneous Tissue) Exposed: Yes Necrotic Amount: Small (1-33%) Necrotic Quality: Adherent Slough Treatment Notes Wound #8 (Lower Leg) Wound Laterality: Left, Circumferential Cleanser Soap and Water Discharge Instruction: Gently cleanse wound with antibacterial soap, rinse and pat dry prior to dressing wounds Wound Cleanser Discharge  Instruction: Wash your hands with soap and water. Remove old dressing, discard into plastic bag and place into trash. Cleanse the wound with Wound Cleanser prior to applying a clean dressing using gauze sponges, not tissues or cotton balls. Do not scrub or use excessive force. Pat dry using gauze sponges, not tissue or cotton balls. Peri-Wound Care Topical Primary Dressing Promogran Matrix 4.34 (in) Discharge Instruction: only on lower left leg  [name], [name] ([id number]) [id number] Page 1 of 11 Visit Report for 12/31/2022 Arrival Information Details Patient Name: Date of Service: [name] 12/31/2022 7:30 A M Medical Record Number: [id number] Patient Account Number: [id number] Date of Birth/Sex: Treating RN: [birth year][name] (62 y.o. [name] Primary Care [name]: [name] Other Clinician: Referring [name]: Treating [name]/Extender: [name] in Treatment: 3 Visit Information History Since Last Visit Added or deleted any medications: No Patient Arrived: Ambulatory Any new allergies or adverse reactions: No Arrival Time: 07:39 Had a fall or experienced change in No Accompanied By: self activities of daily living that may affect Transfer Assistance: None risk of falls: Patient Identification Verified: Yes Hospitalized since last visit: No Secondary Verification Process Completed: Yes Has Dressing in Place as Prescribed: Yes Patient Has Alerts: Yes Has Compression in Place as Prescribed: Yes Patient Alerts: Patient on Blood Thinner Pain Present Now: No type 2 diabetic FISTULA LUE Electronic Signature(s) Signed: 12/31/2022 4:29:50 PM By: [name] Entered By: [name] on 12/31/2022 07:39:30 -------------------------------------------------------------------------------- Clinic Level of Care Assessment Details Patient Name: Date of Service: [name], [name] 12/31/2022 7:30 A M Medical Record Number: [id number] Patient Account Number: [id number] Date of Birth/Sex: Treating RN: [name] (62 y.o. [name] Primary Care Errika Narvaiz: [name] Other Clinician: Referring Lakita Sahlin: Treating Taras Rask/Extender: [name] in Treatment: 3 Clinic Level of Care Assessment Items TOOL 4 Quantity Score []  - 0 Use when only an EandM is performed on FOLLOW-UP visit ASSESSMENTS - Nursing Assessment /  Reassessment X- 1 10 Reassessment of Co-morbidities (includes updates in patient status) X- 1 5 Reassessment of Adherence to Treatment Plan ASSESSMENTS - Wound and Skin A ssessment / Reassessment []  - 0 Simple Wound Assessment / Reassessment - one wound EMMERICH, CRYER ([id number]) [id number] Page 2 of 11 X- 2 5 Complex Wound Assessment / Reassessment - multiple wounds []  - 0 Dermatologic / Skin Assessment (not related to wound area) ASSESSMENTS - Focused Assessment X- 1 5 Circumferential Edema Measurements - multi extremities []  - 0 Nutritional Assessment / Counseling / Intervention []  - 0 Lower Extremity Assessment (monofilament, tuning fork, pulses) []  - 0 Peripheral Arterial Disease Assessment (using hand held doppler) ASSESSMENTS - Ostomy and/or Continence Assessment and Care []  - 0 Incontinence Assessment and Management []  - 0 Ostomy Care Assessment and Management (repouching, etc.) PROCESS - Coordination of Care X - Simple Patient / Family Education for ongoing care 1 15 []  - 0 Complex (extensive) Patient / Family Education for ongoing care X- 1 10 Staff obtains Chiropractor, Records, T Results / Process Orders est []  - 0 Staff telephones HHA, Nursing Homes / Clarify orders / etc []  - 0 Routine Transfer to another Facility (non-emergent condition) []  - 0 Routine Hospital Admission (non-emergent condition) []  - 0 New Admissions / Manufacturing engineer / Ordering NPWT Apligraf, etc. , []  - 0 Emergency Hospital Admission (emergent condition) X- 1 10 Simple Discharge Coordination []  - 0 Complex (extensive) Discharge Coordination PROCESS - Special Needs []  - 0 Pediatric / Minor Patient Management []  - 0 Isolation Patient Management []  - 0 Hearing / Language / Visual special needs []  - 0 Assessment of Community assistance (transportation, D/C planning, etc.) []  - 0 Additional assistance / Altered mentation []  - 0 Support  Surface(s) Assessment (bed, cushion, seat, etc.) INTERVENTIONS - Wound Cleansing / Measurement []  - 0 Simple Wound Cleansing - one wound X- 2 5 Complex Wound Cleansing - multiple wounds X- 1 5  Wound Imaging (photographs - any number of wounds) []  - 0 Wound Tracing (instead of photographs) []  - 0 Simple Wound Measurement - one wound X- 2 5 Complex Wound Measurement - multiple wounds INTERVENTIONS - Wound Dressings X - Small Wound Dressing one or multiple wounds 2 10 []  - 0 Medium Wound Dressing one or multiple wounds []  - 0 Large Wound Dressing one or multiple wounds X- 1 5 Application of Medications - topical []  - 0 Application of Medications - injection INTERVENTIONS - Miscellaneous []  - 0 External ear exam []  - 0 Specimen Collection (cultures, biopsies, blood, body fluids, etc.) []  - 0 Specimen(s) / Culture(s) sent or taken to Lab for analysis [name], [name] ([id number]) [phone].pdf Page 3 of 11 []  - 0 Patient Transfer (multiple staff / Nurse, adult / Similar devices) []  - 0 Simple Staple / Suture removal (25 or less) []  - 0 Complex Staple / Suture removal (26 or more) []  - 0 Hypo / Hyperglycemic Management (close monitor of Blood Glucose) []  - 0 Ankle / Brachial Index (ABI) - do not check if billed separately X- 1 5 Vital Signs Has the patient been seen at the hospital within the last three years: Yes Total Score: 120 Level Of Care: New/Established - Level 4 Electronic Signature(s) Signed: 12/31/2022 4:29:50 PM By: [name] Entered By: [name] on 12/31/2022 08:15:38 -------------------------------------------------------------------------------- Encounter Discharge Information Details Patient Name: Date of Service: Carl Jain [name]. 12/31/2022 7:30 A M Medical Record Number: [id number] Patient Account Number: [id number] Date of Birth/Sex: Treating RN: [name] (62 y.o. [name] Primary Care Pedro Whiters: [name] Other Clinician: Referring Tamatha Gadbois: Treating Sharlon Pfohl/Extender: [name] in Treatment: 3 Encounter Discharge Information Items Post Procedure Vitals Discharge Condition: Stable Temperature ([name]): 97.6 Ambulatory Status: Ambulatory Pulse (bpm): 74 Discharge Destination: Home Respiratory Rate (breaths/min): 18 Transportation: Private Auto Blood Pressure (mmHg): 186/50 Schedule Follow-up Appointment: Yes Clinical Summary of Care: Notes pt asymptomatic, PA Stone aware of increased BP Electronic Signature(s) Signed: 12/31/2022 4:29:50 PM By: [name] Entered By: [name] on 12/31/2022 08:17:12 -------------------------------------------------------------------------------- Lower Extremity Assessment Details Patient Name: Date of Service: [name], [name]. 12/31/2022 7:30 A M Medical Record Number: [id number] Patient Account Number: [id number] [name], [name] ([id number]) [phone].pdf Page 4 of 11 Date of Birth/Sex: Treating RN: [birth year][name] (62 y.o. [name] Primary Care Miasha Emmons: Other Clinician: Tillman [name] Referring Adelynn Gipe: Treating Kaysa Roulhac/Extender: [name] in Treatment: 3 Edema Assessment Assessed: [Left: No] [Right: No] Edema: [Left: No] [Right: No] Calf Left: Right: Point of Measurement: 34 cm From Medial Instep 36.7 cm 35.5 cm Ankle Left: Right: Point of Measurement: 12 cm From Medial Instep 22 cm 23.5 cm Vascular Assessment Pulses: Dorsalis Pedis Palpable: [Left:Yes] [Right:Yes] Extremity colors, hair growth, and conditions: Extremity Color: [Left:Hyperpigmented] [Right:Hyperpigmented] Hair Growth on Extremity: [Left:No] [Right:No] Temperature of Extremity: [Left:Warm < 3 seconds] [Right:Warm < 3 seconds] Toe Nail Assessment Left: Right: Thick: No No Discolored: No No Deformed: No No Improper Length and Hygiene: No No Electronic Signature(s) Signed:  12/31/2022 4:29:50 PM By: [name] Entered By: [name] on 12/31/2022 07:50:43 -------------------------------------------------------------------------------- Multi Wound Chart Details Patient Name: Date of Service: Carl Jain [name]. 12/31/2022 7:30 A M Medical Record Number: [id number] Patient Account Number: [id number] Date of Birth/Sex: Treating RN: [birth year][name] (62 y.o. [name] Primary Care Lenna Hagarty: [name] Other Clinician: Referring Yaniah Thiemann: Treating Jonus Coble/Extender: [name] in Treatment: 3 Vital Signs Height(in): 70 Pulse(bpm): 74 Weight(lbs): 253  Wound Imaging (photographs - any number of wounds) []  - 0 Wound Tracing (instead of photographs) []  - 0 Simple Wound Measurement - one wound X- 2 5 Complex Wound Measurement - multiple wounds INTERVENTIONS - Wound Dressings X - Small Wound Dressing one or multiple wounds 2 10 []  - 0 Medium Wound Dressing one or multiple wounds []  - 0 Large Wound Dressing one or multiple wounds X- 1 5 Application of Medications - topical []  - 0 Application of Medications - injection INTERVENTIONS - Miscellaneous []  - 0 External ear exam []  - 0 Specimen Collection (cultures, biopsies, blood, body fluids, etc.) []  - 0 Specimen(s) / Culture(s) sent or taken to Lab for analysis [name], [name] ([id number]) [phone].pdf Page 3 of 11 []  - 0 Patient Transfer (multiple staff / Nurse, adult / Similar devices) []  - 0 Simple Staple / Suture removal (25 or less) []  - 0 Complex Staple / Suture removal (26 or more) []  - 0 Hypo / Hyperglycemic Management (close monitor of Blood Glucose) []  - 0 Ankle / Brachial Index (ABI) - do not check if billed separately X- 1 5 Vital Signs Has the patient been seen at the hospital within the last three years: Yes Total Score: 120 Level Of Care: New/Established - Level 4 Electronic Signature(s) Signed: 12/31/2022 4:29:50 PM By: [name] Entered By: [name] on 12/31/2022 08:15:38 -------------------------------------------------------------------------------- Encounter Discharge Information Details Patient Name: Date of Service: Carl Jain [name]. 12/31/2022 7:30 A M Medical Record Number: [id number] Patient Account Number: [id number] Date of Birth/Sex: Treating RN: [name] (62 y.o. [name] Primary Care Pedro Whiters: [name] Other Clinician: Referring Tamatha Gadbois: Treating Sharlon Pfohl/Extender: [name] in Treatment: 3 Encounter Discharge Information Items Post Procedure Vitals Discharge Condition: Stable Temperature ([name]): 97.6 Ambulatory Status: Ambulatory Pulse (bpm): 74 Discharge Destination: Home Respiratory Rate (breaths/min): 18 Transportation: Private Auto Blood Pressure (mmHg): 186/50 Schedule Follow-up Appointment: Yes Clinical Summary of Care: Notes pt asymptomatic, PA Stone aware of increased BP Electronic Signature(s) Signed: 12/31/2022 4:29:50 PM By: [name] Entered By: [name] on 12/31/2022 08:17:12 -------------------------------------------------------------------------------- Lower Extremity Assessment Details Patient Name: Date of Service: [name], [name]. 12/31/2022 7:30 A M Medical Record Number: [id number] Patient Account Number: [id number] [name], [name] ([id number]) [phone].pdf Page 4 of 11 Date of Birth/Sex: Treating RN: [birth year]-[name] (62 y.o. [name] Primary Care Miasha Emmons: Other Clinician: Tillman [name] Referring Adelynn Gipe: Treating Kaysa Roulhac/Extender: [name] in Treatment: 3 Edema Assessment Assessed: [Left: No] [Right: No] Edema: [Left: No] [Right: No] Calf Left: Right: Point of Measurement: 34 cm From Medial Instep 36.7 cm 35.5 cm Ankle Left: Right: Point of Measurement: 12 cm From Medial Instep 22 cm 23.5 cm Vascular Assessment Pulses: Dorsalis Pedis Palpable: [Left:Yes] [Right:Yes] Extremity colors, hair growth, and conditions: Extremity Color: [Left:Hyperpigmented] [Right:Hyperpigmented] Hair Growth on Extremity: [Left:No] [Right:No] Temperature of Extremity: [Left:Warm < 3 seconds] [Right:Warm < 3 seconds] Toe Nail Assessment Left: Right: Thick: No No Discolored: No No Deformed: No No Improper Length and Hygiene: No No Electronic Signature(s) Signed:  12/31/2022 4:29:50 PM By: [name] Entered By: [name] on 12/31/2022 07:50:43 -------------------------------------------------------------------------------- Multi Wound Chart Details Patient Name: Date of Service: Carl Jain [name]. 12/31/2022 7:30 A M Medical Record Number: [id number] Patient Account Number: [id number] Date of Birth/Sex: Treating RN: [birth year][name] (62 y.o. [name] Primary Care Lenna Hagarty: [name] Other Clinician: Referring Yaniah Thiemann: Treating Jonus Coble/Extender: [name] in Treatment: 3 Vital Signs Height(in): 70 Pulse(bpm): 74 Weight(lbs): 253  wound Moisten w/normal saline or sterile water; Cover wound as directed. Do not remove from wound bed. Xeroform 5x9-HBD (in/in) Discharge Instruction: Apply Xeroform 5x9-HBD (in/in) as directed Secondary Dressing ABD Pad 5x9 (in/in) Discharge Instruction: Cover with ABD pad Secured With Medipore T - 7M Medipore H Soft Cloth Surgical T ape ape, 2x2 (in/yd) Kerlix Roll Sterile or Non-Sterile 6-ply 4.5x4 (yd/yd) Discharge Instruction: Apply Kerlix as directed Compression Wrap Compression Stockings Add-Ons Electronic Signature(s) Signed: 12/31/2022 4:29:50 PM By: [name] Entered By: [name] on 12/31/2022 07:48:23 -------------------------------------------------------------------------------- Wound Assessment Details Patient Name: Date of Service: Carl Jain [name]. 12/31/2022 7:30 A M Medical Record Number: [id number] Patient Account Number: [id number] Date of Birth/Sex: Treating RN: [birth year][name] (62 y.o. [name] Primary Care Caffie Sotto: [name] Other Clinician: Referring Garrett Bowring: Treating Cambreigh Dearing/Extender: [name] in Treatment: 3 Wound Status [name], [name] ([id number]) [id number] Page 10 of 11 Wound Number: 9 Primary Trauma, Other Etiology: Wound Location: Right, Medial Lower Leg Wound Open Wounding Event: Skin Tear/Laceration Status: Date Acquired: 12/24/2022 Comorbid Cataracts, Anemia, Lymphedema, Asthma, Sleep Apnea, Weeks Of Treatment: 1 History:  Arrhythmia, Congestive Heart Failure, Coronary Artery Disease, Clustered Wound: No Hypertension, Type II Diabetes, End Stage Renal Disease, Gout, Osteoarthritis, Neuropathy Photos Wound Measurements Length: (cm) 0.3 Width: (cm) 0.7 Depth: (cm) 0.1 Area: (cm) 0.165 Volume: (cm) 0.016 % Reduction in Area: 85% % Reduction in Volume: 85.5% Epithelialization: None Tunneling: No Undermining: No Wound Description Classification: Full Thickness Without Exposed Support Structures Exudate Amount: Medium Exudate Type: Serosanguineous Exudate Color: red, brown Foul Odor After Cleansing: No Slough/Fibrino Yes Wound Bed Granulation Amount: Large (67-100%) Granulation Quality: Red Necrotic Amount: None Present (0%) Treatment Notes Wound #9 (Lower Leg) Wound Laterality: Right, Medial Cleanser Soap and Water Discharge Instruction: Gently cleanse wound with antibacterial soap, rinse and pat dry prior to dressing wounds Wound Cleanser Discharge Instruction: Wash your hands with soap and water. Remove old dressing, discard into plastic bag and place into trash. Cleanse the wound with Wound Cleanser prior to applying a clean dressing using gauze sponges, not tissues or cotton balls. Do not scrub or use excessive force. Pat dry using gauze sponges, not tissue or cotton balls. Peri-Wound Care Topical Primary Dressing Promogran Matrix 4.34 (in) Discharge Instruction: Moisten w/normal saline or sterile water; Cover wound as directed. Do not remove from wound bed. Xeroform-HBD 2x2 (in/in) Discharge Instruction: Apply Xeroform-HBD 2x2 (in/in) as directed Secondary Dressing ABD Pad 5x9 (in/in) Discharge Instruction: Cover with ABD pad Secured With Medipore T - 7M Medipore H Soft Cloth Surgical T ape ape, 2x2 (in/yd) Kerlix Roll Sterile or Non-Sterile 6-ply 4.5x4 (yd/yd) Discharge Instruction: Apply Kerlix as directed Compression Wrap Compression Stockings [name], [name] ([id number])  [id number] Page 11 of 11 Add-Ons Electronic Signature(s) Signed: 12/31/2022 4:29:50 PM By: [name] Entered By: [name] on 12/31/2022 07:48:57 -------------------------------------------------------------------------------- Vitals Details Patient Name: Date of Service: Carl Jain [name]. 12/31/2022 7:30 A M Medical Record Number: [id number] Patient Account Number: [id number] Date of Birth/Sex: Treating RN: [birth year] [name] (62 y.o. [name] Primary Care Ommie Degeorge: [name] Other Clinician: Referring Lil Lepage: Treating Zamariah Seaborn/Extender: [name] in Treatment: 3 Vital Signs Time Taken: 07:39 Temperature ([name]): 97.6 Height (in): 70 Pulse (bpm): 74 Weight (lbs): 253 Respiratory Rate (breaths/min): 18 Body Mass Index (BMI): 36.3 Reference Range: 80 - 120 mg / dl Electronic Signature(s) Signed: 12/31/2022 4:29:50 PM By: [name] Entered By: [name] on 12/31/2022 07:40:04

## 2023-01-01 ENCOUNTER — Other Ambulatory Visit (INDEPENDENT_AMBULATORY_CARE_PROVIDER_SITE_OTHER): Payer: Self-pay | Admitting: Nurse Practitioner

## 2023-01-01 DIAGNOSIS — M79605 Pain in left leg: Secondary | ICD-10-CM

## 2023-01-01 DIAGNOSIS — L97812 Non-pressure chronic ulcer of other part of right lower leg with fat layer exposed: Secondary | ICD-10-CM | POA: Diagnosis not present

## 2023-01-01 DIAGNOSIS — I5042 Chronic combined systolic (congestive) and diastolic (congestive) heart failure: Secondary | ICD-10-CM | POA: Diagnosis not present

## 2023-01-01 DIAGNOSIS — I10 Essential (primary) hypertension: Secondary | ICD-10-CM | POA: Diagnosis not present

## 2023-01-01 DIAGNOSIS — L97822 Non-pressure chronic ulcer of other part of left lower leg with fat layer exposed: Secondary | ICD-10-CM | POA: Diagnosis not present

## 2023-01-01 DIAGNOSIS — I890 Lymphedema, not elsewhere classified: Secondary | ICD-10-CM | POA: Diagnosis not present

## 2023-01-01 DIAGNOSIS — E11622 Type 2 diabetes mellitus with other skin ulcer: Secondary | ICD-10-CM | POA: Diagnosis not present

## 2023-01-01 DIAGNOSIS — S81812A Laceration without foreign body, left lower leg, initial encounter: Secondary | ICD-10-CM | POA: Diagnosis not present

## 2023-01-01 DIAGNOSIS — S81809A Unspecified open wound, unspecified lower leg, initial encounter: Secondary | ICD-10-CM | POA: Diagnosis not present

## 2023-01-01 DIAGNOSIS — I87333 Chronic venous hypertension (idiopathic) with ulcer and inflammation of bilateral lower extremity: Secondary | ICD-10-CM | POA: Diagnosis not present

## 2023-01-01 DIAGNOSIS — I480 Paroxysmal atrial fibrillation: Secondary | ICD-10-CM | POA: Diagnosis not present

## 2023-01-06 ENCOUNTER — Ambulatory Visit (INDEPENDENT_AMBULATORY_CARE_PROVIDER_SITE_OTHER): Payer: Commercial Managed Care - PPO

## 2023-01-06 ENCOUNTER — Other Ambulatory Visit: Payer: Self-pay

## 2023-01-06 ENCOUNTER — Encounter (HOSPITAL_BASED_OUTPATIENT_CLINIC_OR_DEPARTMENT_OTHER): Payer: Self-pay | Admitting: Surgery

## 2023-01-06 DIAGNOSIS — M79605 Pain in left leg: Secondary | ICD-10-CM

## 2023-01-06 DIAGNOSIS — M79604 Pain in right leg: Secondary | ICD-10-CM

## 2023-01-06 LAB — VAS US ABI WITH/WO TBI

## 2023-01-06 NOTE — Progress Notes (Addendum)
Reviewed patient history with dr Jonny Ruiz [facility], patient is asa 4 and not candidate for surgery at [facility], called holly at [facility] and made aware pt is not candidate for surgery at [facility], holly to make dr gross and scheduler aware. (Pt stopped eliquis, last dose was 01-06-2023 at 530 am). [name] to make scheduler and dr gross aware pt stopped eliquis.

## 2023-01-07 ENCOUNTER — Other Ambulatory Visit: Payer: Self-pay

## 2023-01-07 ENCOUNTER — Encounter: Payer: Commercial Managed Care - PPO | Admitting: Physician Assistant

## 2023-01-07 ENCOUNTER — Other Ambulatory Visit: Payer: Self-pay | Admitting: Internal Medicine

## 2023-01-07 ENCOUNTER — Encounter (HOSPITAL_COMMUNITY): Payer: Self-pay | Admitting: Surgery

## 2023-01-07 ENCOUNTER — Other Ambulatory Visit: Payer: Self-pay | Admitting: Primary Care

## 2023-01-07 DIAGNOSIS — Z87891 Personal history of nicotine dependence: Secondary | ICD-10-CM | POA: Diagnosis not present

## 2023-01-07 DIAGNOSIS — N186 End stage renal disease: Secondary | ICD-10-CM | POA: Diagnosis not present

## 2023-01-07 DIAGNOSIS — I87333 Chronic venous hypertension (idiopathic) with ulcer and inflammation of bilateral lower extremity: Secondary | ICD-10-CM | POA: Diagnosis not present

## 2023-01-07 DIAGNOSIS — L97822 Non-pressure chronic ulcer of other part of left lower leg with fat layer exposed: Secondary | ICD-10-CM | POA: Diagnosis not present

## 2023-01-07 DIAGNOSIS — E11622 Type 2 diabetes mellitus with other skin ulcer: Secondary | ICD-10-CM | POA: Diagnosis not present

## 2023-01-07 DIAGNOSIS — L97812 Non-pressure chronic ulcer of other part of right lower leg with fat layer exposed: Secondary | ICD-10-CM | POA: Diagnosis not present

## 2023-01-07 DIAGNOSIS — S81812A Laceration without foreign body, left lower leg, initial encounter: Secondary | ICD-10-CM | POA: Diagnosis not present

## 2023-01-07 DIAGNOSIS — I132 Hypertensive heart and chronic kidney disease with heart failure and with stage 5 chronic kidney disease, or end stage renal disease: Secondary | ICD-10-CM | POA: Diagnosis not present

## 2023-01-07 DIAGNOSIS — I890 Lymphedema, not elsewhere classified: Secondary | ICD-10-CM | POA: Diagnosis not present

## 2023-01-07 DIAGNOSIS — I5042 Chronic combined systolic (congestive) and diastolic (congestive) heart failure: Secondary | ICD-10-CM | POA: Diagnosis not present

## 2023-01-07 DIAGNOSIS — S81811A Laceration without foreign body, right lower leg, initial encounter: Secondary | ICD-10-CM | POA: Diagnosis not present

## 2023-01-07 DIAGNOSIS — E1122 Type 2 diabetes mellitus with diabetic chronic kidney disease: Secondary | ICD-10-CM | POA: Diagnosis not present

## 2023-01-07 NOTE — Progress Notes (Signed)
[name], [name] ([id number]) [id number] Page 1 of 9 Visit Report for 01/07/2023 Chief Complaint Document Details Patient Name: Date of Service: [name], [name] 01/07/2023 7:30 A M Medical Record Number: [id number] Patient Account Number: [id number] Date of Birth/Sex: Treating RN: 29-Sep-1960 (62 y.o. [name] Primary Care Provider: [name] Other Clinician: Referring Provider: Treating Provider/Extender: Derrek Monaco in Treatment: 4 Information Obtained from: Patient Chief Complaint Bilateral LE Ulcers Electronic Signature(s) Signed: 01/07/2023 8:06:28 AM By: [name] PA-C Entered By: [name] on 01/07/2023 08:65:78 -------------------------------------------------------------------------------- HPI Details Patient Name: Date of Service: Carl Jain [name]. 01/07/2023 7:30 A M Medical Record Number: [id number] Patient Account Number: [id number] Date of Birth/Sex: Treating RN: 1960/11/24 (62 y.o. [name] Primary Care Provider: [name] Other Clinician: Referring Provider: Treating Provider/Extender: Derrek Monaco in Treatment: 4 History of Present Illness HPI Description: 62 year old diabetic was last hemoglobin A1c was 7.2% has been reviewed today with 2 ulcerated areas in his left lower extremity which she's had for about 8 months and the one most recently was started as a blister for 2 weeks. he has a past medical history of atrial fibrillation, coronary artery disease, CHF, chronic venous stasis dermatitis, diabetes mellitus, hypertension, sleep apnea and is status post kidney transplant in 2012. He is a former smoker and quit cigars in 1996. In the remote past he's had some arterial and venous duplex studies done but does not recall the results no was ever treated for venous insufficiency. 06/10/2016 -- -- had a office visit at [facility] seen by the  physicians assistant Cleda Daub. Patient underwent an ABI which showed medial calcification, normal tibial artery Doppler waveform, PPG waveforms and toe brachial indices suggest normal arterial perfusion to bilateral lower extremities. right TBI was 0.97 and left was 0.84 Bilateral venous duplex was notable for venous reflux in the left greater saphenous vein and popliteal veins.. A left lower extremity endovenous laser ablation was recommended. 06/27/2016 -- I understand he spoke to his insurance company and there is a 3 month waiting period with conservative therapy prior to authorizing surgical intervention. 07/12/16- he is here for follow-up evaluation of a left lower extremity ulcer. He did bring his compression stockings today. He is voicing no complaints or concerns [name], [name] ([id number]) [id number] Page 2 of 9 Readmission: 04/16/2021 upon evaluation today patient appears for reevaluation here in the clinic he was last seen in April 2018. At that time he was having issues with venous stasis ulcers and that again is what he is dealing with today as well. Fortunately I do not see any signs of active infection at this time which is great news and overall very pleased in that regard. Nonetheless he does have some areas of eschar which is going require debridement to clear away some of the necrotic debris so that we can hopefully get this healed as quickly as possible. That was discussed with the patient today as well. The patient does have diabetes mellitus type 2 for which she does have a hemoglobin A1c of 7.27 November 2020. He also has been on Keflex for what was felt to be cellulitis that seems to be doing better these wounds began on March 08, 2021. The patient also has a history of lymphedema, chronic venous insufficiency, and is a kidney transplant patient. 04/24/2021 upon evaluation today patient appears to be doing okay in regard to his wounds.  Fortunately there does not appear to be any  legs and/or potential wounds on your backside. WOUND #8: - Lower Leg Wound Laterality: Left, Circumferential Cleanser: Soap and Water 1 x Per Day/30 Days Discharge Instructions: Gently cleanse wound with antibacterial soap, rinse and pat dry prior to dressing wounds Cleanser: Wound Cleanser 1 x Per Day/30 Days Discharge Instructions: Wash your hands with soap and water. Remove old dressing, discard into plastic bag and place into trash. Cleanse the wound with Wound Cleanser prior to applying a clean dressing using gauze sponges, not tissues or cotton balls. Do not scrub or use excessive force. Pat dry using gauze sponges, not tissue or cotton balls. Prim Dressing: Promogran Matrix 4.34 (in) (Generic) 1 x Per Day/30 Days ary Discharge Instructions: only on lower left leg wound Moisten w/normal saline or sterile water; Cover wound as directed. Do not remove from wound bed. Prim Dressing: Xeroform 5x9-HBD (in/in) (Generic) 1 x Per Day/30 Days ary Discharge Instructions: Apply Xeroform 5x9-HBD (in/in) as directed Secondary Dressing:  ABD Pad 5x9 (in/in) (Generic) 1 x Per Day/30 Days Discharge Instructions: Cover with ABD pad Secured With: Medipore T - 85M Medipore H Soft Cloth Surgical T ape ape, 2x2 (in/yd) (DME) (Generic) 1 x Per Day/30 Days Secured With: Kerlix Roll Sterile or Non-Sterile 6-ply 4.5x4 (yd/yd) (Generic) 1 x Per Day/30 Days Discharge Instructions: Apply Kerlix as directed 1. I would recommend that we have the patient continue to monitor for any signs of infection or worsening if anything changes he knows to contact the office let me know. 2. I am would recommend right now that we go ahead and utilize the Xeroform gauze dressing along with ABD pad which I think is ideal he is using the Tubigrip right now or his compression socks which seem to be doing quite well. We will see patient back for reevaluation in 1 week here in the clinic. If anything worsens or changes patient will contact our office for additional recommendations. Electronic Signature(s) Signed: 01/07/2023 6:09:19 PM By: [name] PA-C Entered By: [name] on 01/07/2023 18:09:19 -------------------------------------------------------------------------------- SuperBill Details Patient Name: Date of Service: Carl Jain [name]. 01/07/2023 Medical Record Number: [id number] Patient Account Number: [id number] Date of Birth/Sex: Treating RN: Apr 28, 1960 (62 y.o. [name] Primary Care Provider: [name] Other Clinician: Referring Provider: Treating Provider/Extender: Derrek Monaco in Treatment: 4 Diagnosis Coding ICD-10 Codes Code Description [phone] Chronic venous hypertension (idiopathic) with ulcer and inflammation of bilateral lower extremity I89.0 Lymphedema, not elsewhere classified E11.622 Type 2 diabetes mellitus with other skin ulcer L97.812 Non-pressure chronic ulcer of other part of right lower leg with fat layer exposed S81.812A Laceration without foreign body, left lower leg, initial  encounter L97.822 Non-pressure chronic ulcer of other part of left lower leg with fat layer exposed BODEN, STUCKY ([id number]) [id number] Page 9 of 9 Z94.0 Kidney transplant status I10 Essential (primary) hypertension I50.42 Chronic combined systolic (congestive) and diastolic (congestive) heart failure I48.0 Paroxysmal atrial fibrillation Z79.01 Long term (current) use of anticoagulants Facility Procedures : CPT4 Code: [id number] Description: 99213 - WOUND CARE VISIT-LEV 3 EST PT Modifier: Quantity: 1 Physician Procedures : CPT4 Code Description Modifier [id number] 99213 - WC PHYS LEVEL 3 - EST PT ICD-10 Diagnosis Description I87.333 Chronic venous hypertension (idiopathic) with ulcer and inflammation of bilateral lower extremity I89.0 Lymphedema, not elsewhere classified  E11.622 Type 2 diabetes mellitus with other skin ulcer L97.812 Non-pressure chronic ulcer of other part of right lower leg with fat layer exposed Quantity: 1 Electronic Signature(s) Signed: 01/07/2023 6:09:32 PM By: Larina Bras,  he has 2 issues 1 where he hit his left leg on a piece of furniture which I think is the primary concern there. The other issue is that of just having blisters popping up on his right leg. He tells me he is really not wearing compression socks regularly maybe 3 times per week when I pinned him down on it is the most that he is wearing these and not even certain if that regular or not to be perfectly honest. With that being said I do  believe that he would benefit from a likely the compression therapy but at the same time he has such tremendous pain in the past he is always refused this therefore I think Tubigrip is probably the best way to go at this point considering he cannot even get his compression socks on currently due to the pain. Patient does have a past medical history which is significant for chronic venous hypertension, lymphedema, diabetes mellitus type 2, he is a kidney transplant patient, has hypertension, congestive heart failure, atrial fibrillation, and is on long-term anticoagulant therapy in the form of Eliquis. 12-12-2022 upon evaluation today patient appears to be doing well with regard to his wound he does have a lot of new skin growth which is good news unfortunately however has had a lot of burning and pain with the Hydrofera Blue that also seem to be sticking and causing a lot of bleeding which I do not like. We may need to try something a little bit different here. 12-17-2022 upon evaluation today patient appears to be doing well currently with regard to his legs. He is actually showing much less Cinoman bleeding and overall seems to be doing much better compared to where he was previous. Fortunately I do not see any evidence of active infection locally or systemically which is great news. No fevers, chills, nausea, vomiting, or diarrhea. 12-24-2022 upon evaluation today patient appears to be doing decently well in regard to his wounds. Has been tolerating the dressing changes without complication. With that being said unfortunately on the right leg he actually hit his leg on the mower causing a new wound on the medial aspect. This had previously been completely healed. I think that he may benefit from derma savers to try to prevent this from being an ongoing and frequent issue for him. 12-31-2022 upon evaluation today patient appears to be doing well currently in regard to his wounds which are actually  showing signs of excellent improvement. Fortunately I do not see any signs of infection at this time which is great news in general I think is very close to resolution. 01-07-24 upon evaluation today patient appears to be doing excellent in fact he is very close to complete resolution as far as his wounds are concerned. Fortunately I do not see any signs of worsening infection at this point and I do believe that the patient is making really good headway towards complete closure which is great news. Electronic Signature(s) Signed: 01/07/2023 6:08:38 PM By: [name] PA-C Entered By: [name] on 01/07/2023 18:08:37 [name] ([id number]) [id number] Page 3 of 9 -------------------------------------------------------------------------------- Physical Exam Details Patient Name: Date of Service: [name], [name] 01/07/2023 7:30 A M Medical Record Number: [id number] Patient Account Number: [id number] Date of Birth/Sex: Treating RN: Nov 21, 1960 (62 y.o. [name] Primary Care Provider: [name] Other Clinician: Referring Provider: Treating Provider/Extender: Derrek Monaco in Treatment: 4 Constitutional Well-nourished and well-hydrated in no acute distress. Respiratory normal breathing  he has 2 issues 1 where he hit his left leg on a piece of furniture which I think is the primary concern there. The other issue is that of just having blisters popping up on his right leg. He tells me he is really not wearing compression socks regularly maybe 3 times per week when I pinned him down on it is the most that he is wearing these and not even certain if that regular or not to be perfectly honest. With that being said I do  believe that he would benefit from a likely the compression therapy but at the same time he has such tremendous pain in the past he is always refused this therefore I think Tubigrip is probably the best way to go at this point considering he cannot even get his compression socks on currently due to the pain. Patient does have a past medical history which is significant for chronic venous hypertension, lymphedema, diabetes mellitus type 2, he is a kidney transplant patient, has hypertension, congestive heart failure, atrial fibrillation, and is on long-term anticoagulant therapy in the form of Eliquis. 12-12-2022 upon evaluation today patient appears to be doing well with regard to his wound he does have a lot of new skin growth which is good news unfortunately however has had a lot of burning and pain with the Hydrofera Blue that also seem to be sticking and causing a lot of bleeding which I do not like. We may need to try something a little bit different here. 12-17-2022 upon evaluation today patient appears to be doing well currently with regard to his legs. He is actually showing much less Cinoman bleeding and overall seems to be doing much better compared to where he was previous. Fortunately I do not see any evidence of active infection locally or systemically which is great news. No fevers, chills, nausea, vomiting, or diarrhea. 12-24-2022 upon evaluation today patient appears to be doing decently well in regard to his wounds. Has been tolerating the dressing changes without complication. With that being said unfortunately on the right leg he actually hit his leg on the mower causing a new wound on the medial aspect. This had previously been completely healed. I think that he may benefit from derma savers to try to prevent this from being an ongoing and frequent issue for him. 12-31-2022 upon evaluation today patient appears to be doing well currently in regard to his wounds which are actually  showing signs of excellent improvement. Fortunately I do not see any signs of infection at this time which is great news in general I think is very close to resolution. 01-07-24 upon evaluation today patient appears to be doing excellent in fact he is very close to complete resolution as far as his wounds are concerned. Fortunately I do not see any signs of worsening infection at this point and I do believe that the patient is making really good headway towards complete closure which is great news. Electronic Signature(s) Signed: 01/07/2023 6:08:38 PM By: [name] PA-C Entered By: [name] on 01/07/2023 18:08:37 [name] ([id number]) [id number] Page 3 of 9 -------------------------------------------------------------------------------- Physical Exam Details Patient Name: Date of Service: [name], [name] 01/07/2023 7:30 A M Medical Record Number: [id number] Patient Account Number: [id number] Date of Birth/Sex: Treating RN: Nov 21, 1960 (62 y.o. [name] Primary Care Provider: [name] Other Clinician: Referring Provider: Treating Provider/Extender: Derrek Monaco in Treatment: 4 Constitutional Well-nourished and well-hydrated in no acute distress. Respiratory normal breathing  he has 2 issues 1 where he hit his left leg on a piece of furniture which I think is the primary concern there. The other issue is that of just having blisters popping up on his right leg. He tells me he is really not wearing compression socks regularly maybe 3 times per week when I pinned him down on it is the most that he is wearing these and not even certain if that regular or not to be perfectly honest. With that being said I do  believe that he would benefit from a likely the compression therapy but at the same time he has such tremendous pain in the past he is always refused this therefore I think Tubigrip is probably the best way to go at this point considering he cannot even get his compression socks on currently due to the pain. Patient does have a past medical history which is significant for chronic venous hypertension, lymphedema, diabetes mellitus type 2, he is a kidney transplant patient, has hypertension, congestive heart failure, atrial fibrillation, and is on long-term anticoagulant therapy in the form of Eliquis. 12-12-2022 upon evaluation today patient appears to be doing well with regard to his wound he does have a lot of new skin growth which is good news unfortunately however has had a lot of burning and pain with the Hydrofera Blue that also seem to be sticking and causing a lot of bleeding which I do not like. We may need to try something a little bit different here. 12-17-2022 upon evaluation today patient appears to be doing well currently with regard to his legs. He is actually showing much less Cinoman bleeding and overall seems to be doing much better compared to where he was previous. Fortunately I do not see any evidence of active infection locally or systemically which is great news. No fevers, chills, nausea, vomiting, or diarrhea. 12-24-2022 upon evaluation today patient appears to be doing decently well in regard to his wounds. Has been tolerating the dressing changes without complication. With that being said unfortunately on the right leg he actually hit his leg on the mower causing a new wound on the medial aspect. This had previously been completely healed. I think that he may benefit from derma savers to try to prevent this from being an ongoing and frequent issue for him. 12-31-2022 upon evaluation today patient appears to be doing well currently in regard to his wounds which are actually  showing signs of excellent improvement. Fortunately I do not see any signs of infection at this time which is great news in general I think is very close to resolution. 01-07-24 upon evaluation today patient appears to be doing excellent in fact he is very close to complete resolution as far as his wounds are concerned. Fortunately I do not see any signs of worsening infection at this point and I do believe that the patient is making really good headway towards complete closure which is great news. Electronic Signature(s) Signed: 01/07/2023 6:08:38 PM By: [name] PA-C Entered By: [name] on 01/07/2023 18:08:37 [name] ([id number]) [id number] Page 3 of 9 -------------------------------------------------------------------------------- Physical Exam Details Patient Name: Date of Service: [name], [name] 01/07/2023 7:30 A M Medical Record Number: [id number] Patient Account Number: [id number] Date of Birth/Sex: Treating RN: Nov 21, 1960 (62 y.o. [name] Primary Care Provider: [name] Other Clinician: Referring Provider: Treating Provider/Extender: Derrek Monaco in Treatment: 4 Constitutional Well-nourished and well-hydrated in no acute distress. Respiratory normal breathing  he has 2 issues 1 where he hit his left leg on a piece of furniture which I think is the primary concern there. The other issue is that of just having blisters popping up on his right leg. He tells me he is really not wearing compression socks regularly maybe 3 times per week when I pinned him down on it is the most that he is wearing these and not even certain if that regular or not to be perfectly honest. With that being said I do  believe that he would benefit from a likely the compression therapy but at the same time he has such tremendous pain in the past he is always refused this therefore I think Tubigrip is probably the best way to go at this point considering he cannot even get his compression socks on currently due to the pain. Patient does have a past medical history which is significant for chronic venous hypertension, lymphedema, diabetes mellitus type 2, he is a kidney transplant patient, has hypertension, congestive heart failure, atrial fibrillation, and is on long-term anticoagulant therapy in the form of Eliquis. 12-12-2022 upon evaluation today patient appears to be doing well with regard to his wound he does have a lot of new skin growth which is good news unfortunately however has had a lot of burning and pain with the Hydrofera Blue that also seem to be sticking and causing a lot of bleeding which I do not like. We may need to try something a little bit different here. 12-17-2022 upon evaluation today patient appears to be doing well currently with regard to his legs. He is actually showing much less Cinoman bleeding and overall seems to be doing much better compared to where he was previous. Fortunately I do not see any evidence of active infection locally or systemically which is great news. No fevers, chills, nausea, vomiting, or diarrhea. 12-24-2022 upon evaluation today patient appears to be doing decently well in regard to his wounds. Has been tolerating the dressing changes without complication. With that being said unfortunately on the right leg he actually hit his leg on the mower causing a new wound on the medial aspect. This had previously been completely healed. I think that he may benefit from derma savers to try to prevent this from being an ongoing and frequent issue for him. 12-31-2022 upon evaluation today patient appears to be doing well currently in regard to his wounds which are actually  showing signs of excellent improvement. Fortunately I do not see any signs of infection at this time which is great news in general I think is very close to resolution. 01-07-24 upon evaluation today patient appears to be doing excellent in fact he is very close to complete resolution as far as his wounds are concerned. Fortunately I do not see any signs of worsening infection at this point and I do believe that the patient is making really good headway towards complete closure which is great news. Electronic Signature(s) Signed: 01/07/2023 6:08:38 PM By: [name] PA-C Entered By: [name] on 01/07/2023 18:08:37 [name] ([id number]) [id number] Page 3 of 9 -------------------------------------------------------------------------------- Physical Exam Details Patient Name: Date of Service: [name], [name] 01/07/2023 7:30 A M Medical Record Number: [id number] Patient Account Number: [id number] Date of Birth/Sex: Treating RN: Nov 21, 1960 (62 y.o. [name] Primary Care Provider: [name] Other Clinician: Referring Provider: Treating Provider/Extender: Derrek Monaco in Treatment: 4 Constitutional Well-nourished and well-hydrated in no acute distress. Respiratory normal breathing  legs and/or potential wounds on your backside. WOUND #8: - Lower Leg Wound Laterality: Left, Circumferential Cleanser: Soap and Water 1 x Per Day/30 Days Discharge Instructions: Gently cleanse wound with antibacterial soap, rinse and pat dry prior to dressing wounds Cleanser: Wound Cleanser 1 x Per Day/30 Days Discharge Instructions: Wash your hands with soap and water. Remove old dressing, discard into plastic bag and place into trash. Cleanse the wound with Wound Cleanser prior to applying a clean dressing using gauze sponges, not tissues or cotton balls. Do not scrub or use excessive force. Pat dry using gauze sponges, not tissue or cotton balls. Prim Dressing: Promogran Matrix 4.34 (in) (Generic) 1 x Per Day/30 Days ary Discharge Instructions: only on lower left leg wound Moisten w/normal saline or sterile water; Cover wound as directed. Do not remove from wound bed. Prim Dressing: Xeroform 5x9-HBD (in/in) (Generic) 1 x Per Day/30 Days ary Discharge Instructions: Apply Xeroform 5x9-HBD (in/in) as directed Secondary Dressing:  ABD Pad 5x9 (in/in) (Generic) 1 x Per Day/30 Days Discharge Instructions: Cover with ABD pad Secured With: Medipore T - 85M Medipore H Soft Cloth Surgical T ape ape, 2x2 (in/yd) (DME) (Generic) 1 x Per Day/30 Days Secured With: Kerlix Roll Sterile or Non-Sterile 6-ply 4.5x4 (yd/yd) (Generic) 1 x Per Day/30 Days Discharge Instructions: Apply Kerlix as directed 1. I would recommend that we have the patient continue to monitor for any signs of infection or worsening if anything changes he knows to contact the office let me know. 2. I am would recommend right now that we go ahead and utilize the Xeroform gauze dressing along with ABD pad which I think is ideal he is using the Tubigrip right now or his compression socks which seem to be doing quite well. We will see patient back for reevaluation in 1 week here in the clinic. If anything worsens or changes patient will contact our office for additional recommendations. Electronic Signature(s) Signed: 01/07/2023 6:09:19 PM By: [name] PA-C Entered By: [name] on 01/07/2023 18:09:19 -------------------------------------------------------------------------------- SuperBill Details Patient Name: Date of Service: Carl Jain [name]. 01/07/2023 Medical Record Number: [id number] Patient Account Number: [id number] Date of Birth/Sex: Treating RN: Apr 28, 1960 (62 y.o. [name] Primary Care Provider: [name] Other Clinician: Referring Provider: Treating Provider/Extender: Derrek Monaco in Treatment: 4 Diagnosis Coding ICD-10 Codes Code Description [phone] Chronic venous hypertension (idiopathic) with ulcer and inflammation of bilateral lower extremity I89.0 Lymphedema, not elsewhere classified E11.622 Type 2 diabetes mellitus with other skin ulcer L97.812 Non-pressure chronic ulcer of other part of right lower leg with fat layer exposed S81.812A Laceration without foreign body, left lower leg, initial  encounter L97.822 Non-pressure chronic ulcer of other part of left lower leg with fat layer exposed BODEN, STUCKY ([id number]) [id number] Page 9 of 9 Z94.0 Kidney transplant status I10 Essential (primary) hypertension I50.42 Chronic combined systolic (congestive) and diastolic (congestive) heart failure I48.0 Paroxysmal atrial fibrillation Z79.01 Long term (current) use of anticoagulants Facility Procedures : CPT4 Code: [id number] Description: 99213 - WOUND CARE VISIT-LEV 3 EST PT Modifier: Quantity: 1 Physician Procedures : CPT4 Code Description Modifier [id number] 99213 - WC PHYS LEVEL 3 - EST PT ICD-10 Diagnosis Description I87.333 Chronic venous hypertension (idiopathic) with ulcer and inflammation of bilateral lower extremity I89.0 Lymphedema, not elsewhere classified  E11.622 Type 2 diabetes mellitus with other skin ulcer L97.812 Non-pressure chronic ulcer of other part of right lower leg with fat layer exposed Quantity: 1 Electronic Signature(s) Signed: 01/07/2023 6:09:32 PM By: Larina Bras,  Electronic Signature(s) Signed: 01/07/2023 4:19:17 PM By: [name] Signed: 01/07/2023 6:10:25 PM By: [name] PA-C Previous Signature: 01/07/2023 8:23:52 AM Version By: [name] Entered By: [name] on 01/07/2023 08:28:12 -------------------------------------------------------------------------------- Problem List Details Patient Name: Date of Service: Carl Jain [name]. 01/07/2023 7:30 A M Medical Record Number: [id number] Patient  Account Number: [id number] Date of Birth/Sex: Treating RN: Apr 25, 1960 (62 y.o. [name] Primary Care Provider: [name] Other Clinician: Referring Provider: Treating Provider/Extender: Derrek Monaco in Treatment: [address] Murphy [name] ([id number]) [id number] Page 5 of 9 Active Problems ICD-10 Encounter Code Description Active Date MDM Diagnosis I87.333 Chronic venous hypertension (idiopathic) with ulcer and inflammation of 12/05/2022 No Yes bilateral lower extremity I89.0 Lymphedema, not elsewhere classified 12/05/2022 No Yes E11.622 Type 2 diabetes mellitus with other skin ulcer 12/05/2022 No Yes L97.812 Non-pressure chronic ulcer of other part of right lower leg with fat layer 12/05/2022 No Yes exposed S81.812A Laceration without foreign body, left lower leg, initial encounter 12/05/2022 No Yes L97.822 Non-pressure chronic ulcer of other part of left lower leg with fat layer exposed9/02/2023 No Yes Z94.0 Kidney transplant status 12/05/2022 No Yes I10 Essential (primary) hypertension 12/05/2022 No Yes I50.42 Chronic combined systolic (congestive) and diastolic (congestive) heart failure 12/05/2022 No Yes I48.0 Paroxysmal atrial fibrillation 12/05/2022 No Yes Z79.01 Long term (current) use of anticoagulants 12/05/2022 No Yes Inactive Problems Resolved Problems Electronic Signature(s) Signed: 01/07/2023 8:06:25 AM By: [name] PA-C Entered By: [name] on 01/07/2023 08:06:25 [name] ([id number]) [id number] Page 6 of 9 -------------------------------------------------------------------------------- Progress Note Details Patient Name: Date of Service: [name], [name] 01/07/2023 7:30 A M Medical Record Number: [id number] Patient Account Number: [id number] Date of Birth/Sex: Treating RN: August 24, 1960 (62 y.o. [name] Primary Care Provider: [name] Other Clinician: Referring  Provider: Treating Provider/Extender: Derrek Monaco in Treatment: 4 Subjective Chief Complaint Information obtained from Patient Bilateral LE Ulcers History of Present Illness (HPI) 62 year old diabetic was last hemoglobin A1c was 7.2% has been reviewed today with 2 ulcerated areas in his left lower extremity which she's had for about 8 months and the one most recently was started as a blister for 2 weeks. he has a past medical history of atrial fibrillation, coronary artery disease, CHF, chronic venous stasis dermatitis, diabetes mellitus, hypertension, sleep apnea and is status post kidney transplant in 2012. He is a former smoker and quit cigars in 1996. In the remote past he's had some arterial and venous duplex studies done but does not recall the results no was ever treated for venous insufficiency. 06/10/2016 -- -- had a office visit at [facility] seen by the physicians assistant Cleda Daub. Patient underwent an ABI which showed medial calcification, normal tibial artery Doppler waveform, PPG waveforms and toe brachial indices suggest normal arterial perfusion to bilateral lower extremities. right TBI was 0.97 and left was 0.84 Bilateral venous duplex was notable for venous reflux in the left greater saphenous vein and popliteal veins.. A left lower extremity endovenous laser ablation was recommended. 06/27/2016 -- I understand he spoke to his insurance company and there is a 3 month waiting period with conservative therapy prior to authorizing surgical intervention. 07/12/16- he is here for follow-up evaluation of a left lower extremity ulcer. He did bring his compression stockings today. He is voicing no complaints or concerns Readmission: 04/16/2021 upon evaluation today patient appears for reevaluation here in the clinic he was last seen in April 2018. At that time  he has 2 issues 1 where he hit his left leg on a piece of furniture which I think is the primary concern there. The other issue is that of just having blisters popping up on his right leg. He tells me he is really not wearing compression socks regularly maybe 3 times per week when I pinned him down on it is the most that he is wearing these and not even certain if that regular or not to be perfectly honest. With that being said I do  believe that he would benefit from a likely the compression therapy but at the same time he has such tremendous pain in the past he is always refused this therefore I think Tubigrip is probably the best way to go at this point considering he cannot even get his compression socks on currently due to the pain. Patient does have a past medical history which is significant for chronic venous hypertension, lymphedema, diabetes mellitus type 2, he is a kidney transplant patient, has hypertension, congestive heart failure, atrial fibrillation, and is on long-term anticoagulant therapy in the form of Eliquis. 12-12-2022 upon evaluation today patient appears to be doing well with regard to his wound he does have a lot of new skin growth which is good news unfortunately however has had a lot of burning and pain with the Hydrofera Blue that also seem to be sticking and causing a lot of bleeding which I do not like. We may need to try something a little bit different here. 12-17-2022 upon evaluation today patient appears to be doing well currently with regard to his legs. He is actually showing much less Cinoman bleeding and overall seems to be doing much better compared to where he was previous. Fortunately I do not see any evidence of active infection locally or systemically which is great news. No fevers, chills, nausea, vomiting, or diarrhea. 12-24-2022 upon evaluation today patient appears to be doing decently well in regard to his wounds. Has been tolerating the dressing changes without complication. With that being said unfortunately on the right leg he actually hit his leg on the mower causing a new wound on the medial aspect. This had previously been completely healed. I think that he may benefit from derma savers to try to prevent this from being an ongoing and frequent issue for him. 12-31-2022 upon evaluation today patient appears to be doing well currently in regard to his wounds which are actually  showing signs of excellent improvement. Fortunately I do not see any signs of infection at this time which is great news in general I think is very close to resolution. 01-07-24 upon evaluation today patient appears to be doing excellent in fact he is very close to complete resolution as far as his wounds are concerned. Fortunately I do not see any signs of worsening infection at this point and I do believe that the patient is making really good headway towards complete closure which is great news. Electronic Signature(s) Signed: 01/07/2023 6:08:38 PM By: [name] PA-C Entered By: [name] on 01/07/2023 18:08:37 [name] ([id number]) [id number] Page 3 of 9 -------------------------------------------------------------------------------- Physical Exam Details Patient Name: Date of Service: [name], [name] 01/07/2023 7:30 A M Medical Record Number: [id number] Patient Account Number: [id number] Date of Birth/Sex: Treating RN: Nov 21, 1960 (62 y.o. [name] Primary Care Provider: [name] Other Clinician: Referring Provider: Treating Provider/Extender: Derrek Monaco in Treatment: 4 Constitutional Well-nourished and well-hydrated in no acute distress. Respiratory normal breathing  [name], [name] ([id number]) [id number] Page 1 of 9 Visit Report for 01/07/2023 Chief Complaint Document Details Patient Name: Date of Service: [name], [name] 01/07/2023 7:30 A M Medical Record Number: [id number] Patient Account Number: [id number] Date of Birth/Sex: Treating RN: 29-Sep-1960 (62 y.o. [name] Primary Care Provider: [name] Other Clinician: Referring Provider: Treating Provider/Extender: Derrek Monaco in Treatment: 4 Information Obtained from: Patient Chief Complaint Bilateral LE Ulcers Electronic Signature(s) Signed: 01/07/2023 8:06:28 AM By: [name] PA-C Entered By: [name] on 01/07/2023 08:65:78 -------------------------------------------------------------------------------- HPI Details Patient Name: Date of Service: Carl Jain [name]. 01/07/2023 7:30 A M Medical Record Number: [id number] Patient Account Number: [id number] Date of Birth/Sex: Treating RN: 1960/11/24 (62 y.o. [name] Primary Care Provider: [name] Other Clinician: Referring Provider: Treating Provider/Extender: Derrek Monaco in Treatment: 4 History of Present Illness HPI Description: 62 year old diabetic was last hemoglobin A1c was 7.2% has been reviewed today with 2 ulcerated areas in his left lower extremity which she's had for about 8 months and the one most recently was started as a blister for 2 weeks. he has a past medical history of atrial fibrillation, coronary artery disease, CHF, chronic venous stasis dermatitis, diabetes mellitus, hypertension, sleep apnea and is status post kidney transplant in 2012. He is a former smoker and quit cigars in 1996. In the remote past he's had some arterial and venous duplex studies done but does not recall the results no was ever treated for venous insufficiency. 06/10/2016 -- -- had a office visit at [facility] seen by the  physicians assistant Cleda Daub. Patient underwent an ABI which showed medial calcification, normal tibial artery Doppler waveform, PPG waveforms and toe brachial indices suggest normal arterial perfusion to bilateral lower extremities. right TBI was 0.97 and left was 0.84 Bilateral venous duplex was notable for venous reflux in the left greater saphenous vein and popliteal veins.. A left lower extremity endovenous laser ablation was recommended. 06/27/2016 -- I understand he spoke to his insurance company and there is a 3 month waiting period with conservative therapy prior to authorizing surgical intervention. 07/12/16- he is here for follow-up evaluation of a left lower extremity ulcer. He did bring his compression stockings today. He is voicing no complaints or concerns [name], [name] ([id number]) [id number] Page 2 of 9 Readmission: 04/16/2021 upon evaluation today patient appears for reevaluation here in the clinic he was last seen in April 2018. At that time he was having issues with venous stasis ulcers and that again is what he is dealing with today as well. Fortunately I do not see any signs of active infection at this time which is great news and overall very pleased in that regard. Nonetheless he does have some areas of eschar which is going require debridement to clear away some of the necrotic debris so that we can hopefully get this healed as quickly as possible. That was discussed with the patient today as well. The patient does have diabetes mellitus type 2 for which she does have a hemoglobin A1c of 7.27 November 2020. He also has been on Keflex for what was felt to be cellulitis that seems to be doing better these wounds began on March 08, 2021. The patient also has a history of lymphedema, chronic venous insufficiency, and is a kidney transplant patient. 04/24/2021 upon evaluation today patient appears to be doing okay in regard to his wounds.  Fortunately there does not appear to be any

## 2023-01-07 NOTE — Progress Notes (Signed)
Anesthesia Chart Review   Case: [id number] Date/Time: 01/08/23 0815   Procedures:      LASER REMOVAL ABLATION OF CONDYLOMATA - GEN AND LOCAL     ANORECTAL EXAM UNDER ANESTHESIA   Anesthesia type: General   Pre-op diagnosis: CONDYLOMA ACUMINATUM OF PERIANAL AND RECTAL REGION   Location: [facility]OM 01 / [facility]   Surgeons: Karie Soda, MD       DISCUSSION:62 y.o. former smoker with h/o HTN, asthma, OSA with BiPAP, chronic atrial fibrillation on Eliquis (advised to hold 3 days), CAD CABG 2005, CHF, pulmonary HTN, PVD, DM II, CKD Stage III,  s/p right renal transplant, s/p gastric bypass, condyloma acuminatum of perianal and rectal region scheduled for above procedure 01/08/2023 with Dr. Michaell Cowing.   Pt moved [facility] to [facility], ASA 4. Evaluate DOS, not seen in PAT clinic.   Pt last seen by cardiology 12/17/2022. Per OV note, "Echo in 1/24 showed EF 40-45% , RV mod reduced, RVSP 49 mmHg, no AS, mild MR. Management complicated by CKD stage 3b.  Today, NYHA class II in terms of dyspnea.  He is not volume overloaded by exam, weight stable."  Pt previous visit 12/03/2022 cleared for surgery by Dr. Kirke Corin. Per note, "Preop cardiovascular evaluation: He has extensive cardiovascular history but has been stable.  He is at moderate risk from a cardiac standpoint.  Given underlying chronic kidney disease, Eliquis can be held 2-3 days before surgery."  12/17/2022 Creatinine 2.83 11/01/2022 Creatinine 2.60 10/21/2022 Creatinine 2.88 09/09/2022 Creatinine 2.58 VS: Ht 5\' 10"  (1.778 m)   Wt 112 kg   BMI 35.44 kg/m   PROVIDERS: [name], MD  Cardiologist:  Dr. Kirke Corin  Nephrology: Dr. Yetta [name]   HF Cardiologist: Dr. Shirlee Latch  LABS: Labs reviewed: Acceptable for surgery. (all labs ordered are listed, but only abnormal results are displayed)  Labs Reviewed - No data to display   IMAGES:   EKG:   CV: Cardiac Cath 04/04/2022 1. Elevated filling pressures.  2. Moderate primarily  pulmonary venous hypertension.  3. Preserved cardiac output.   Echo 04/02/2022 1. Left ventricular ejection fraction, by estimation, is 40 to 45%. The  left ventricle has mildly decreased function. The left ventricle  demonstrates global hypokinesis. There is mild concentric left ventricular  hypertrophy. Left ventricular diastolic  function could not be evaluated.   2. Right ventricular systolic function is moderately reduced. The right  ventricular size is moderately enlarged. There is moderately elevated  pulmonary artery systolic pressure. The estimated right ventricular  systolic pressure is 48.6 mmHg.   3. Left atrial size was severely dilated.   4. Right atrial size was mild to moderately dilated.   5. The mitral valve is degenerative. Mild mitral valve regurgitation. No  evidence of mitral stenosis.   6. The aortic valve is tricuspid. Aortic valve regurgitation is not  visualized. No aortic stenosis is present.   7. The inferior vena cava is dilated in size with <50% respiratory  variability, suggesting right atrial pressure of 15 mmHg.   Comparison(s): No significant change from prior study.  Past Medical History:  Diagnosis Date   Anal condyloma    Anemia    Aortic atherosclerosis (HCC)    Aortic stenosis    a.) TTE 10/2021: Mod AS. AVA 1.06cm^2 (VTI). Mean grad ; b.) TTE 04/02/2022: no AV stenosis   Asthma, persistent controlled 02/25/2013   Attention or concentration deficit 04/26/2021   BPH with obstruction/lower urinary tract symptoms 09/25/2014   CAD (  coronary artery disease) 2005   a.) LHC 1996 -> HG stenosis mLAD -> PTCA/PCI with stent x 1 (unk type) -> complicated by CFA pseudoanurysm (required surg);  b.) LHC 08/10/2002  -> 95% LAD, 70% ISR LAD, 80% pOM, 70% mRCA --> CVTS consult; c.) 4v CABG 08/13/2002; c.) 03/2018 MV: Small, fixed inferoapical and apical sep defect. No isc. EF 47% (60-65% by 05/2018 TTE); d.) 02/2021 MV: small Apical inf fixed defect. No isc ->  low risk.   Cardiomegaly    Cellulitis and abscess of left leg 07/22/2020   Chronic atrial fibrillation    a.) CHA2DS2VASc = 4 (CHF, HTN, vascular disease history, T2DM);  b.) rate/rhythm maintained without pharmacological intervention; chronically anticoagulated with apixaban   Chronic combined systolic and diastolic CHF (congestive heart failure)    a.) TTE 7/18 : EF 45-50%; b.) TTE 05/2018 : EF 60-65%, RVSP 74.8; c.) TTE 12/2018: EF 50-55%. Sev dil LA; d.) TTE 04/2019: EF 45-50%; e.) TTE 07/2021: EF 40-45%, G3DD; f.) TTE 10/2021: EF 40-45%, mild conc LVH, septal-lat dyssynchrony (LBBB), mildly red RVSF, mild-mod dil LA, mildly dil RA, mild-mod MS, mod AS; g.) TTE 04/02/2022: EF 40-45%, glob HK, LVH, red RVSF, RVE, sev LAE, mild-mod RAE, mild MR   Chronic venous stasis dermatitis of both lower extremities    Cytomegaloviral disease 2017   Diverticulosis of colon 04/22/2013   Dyspnea    Elevated RIGHT hemidiaphragm    Erectile dysfunction    a.) on topical TRT + Trimix (alprostadil/papaverine/phentolamine) injections   ESRD (end stage renal disease) (HCC)    a.) dialysis dependent 2004-2012; b.) s/p cadaveric RIGHT renal transplant 09/13/2010   Essential hypertension 12/17/2010   GERD (gastroesophageal reflux disease)    Gout    History of 2019 novel coronavirus disease (COVID-19) 06/30/2019   History of bilateral cataract extraction 10/2017   History of renal transplant 09/13/2010   a.) s/p cadaveric donor transplant 09/13/2010   Hx of bilateral cataract extraction 10/2017   Hyperlipidemia    Hypogonadism in male 09/25/2014   Ischemic cardiomyopathy    Left cephalic vein thrombosis 06/2019   Long term current use of anticoagulant    a.) apixaban   Long term current use of immunosuppressive drug    a.) mycophenolate + prednisone + tacrolimus   Major depressive disorder 10/04/2013   Mitral stenosis 11/07/2021   a.) TTE 11/07/2021: severe MAC, mild-mod MS (MPG 6 mmHg); b.) TTE  04/02/2022: no MV stenosis   Murmur    Obesity hypoventilation syndrome 03/31/2012   OSA treated with BiPAP 09/29/2012   PAH (pulmonary artery hypertension)    a. 04/2019 RHC: RA 19, RV 80/20, PA 78/31 (51), PCWP 25, CO/CI 7.69/3.26. PVR 3.25 --> Sev PAH, likely primarily PV HTN; b.) RHC 04/04/2022: mRA 13, mPA 40, mPCWP 20, PA sat 59, AO sat 92, CO 7.63, CI 3.26, PVR 2.6 --> mod portal venous HTN   Pancreatitis    S/P CABG x 4 08/13/2002   a.) LIMA-LAD, LRA-OM, SVG-D1, SVG-dRCA   S/P gastric bypass    Synovitis of finger 06/11/2019   Trigger finger, unspecified little finger 12/23/2018   Type 2 diabetes mellitus with hyperglycemia, with long-term current use of insulin 09/09/2014   a.) has FreeStyle Libre CGM   Ulcer    Left shin goes to [facility] care center [name] qweek   Wears glasses    Wears hearing aid in both ears     Past Surgical History:  Procedure Laterality Date   CARDIAC CATHETERIZATION  N/A 08/10/2002   CATARACT EXTRACTION W/PHACO Right 10/29/2017   Procedure: CATARACT EXTRACTION PHACO AND INTRAOCULAR LENS PLACEMENT (IOC)  RIGHT DIABETIC;  Surgeon: Lockie Mola, MD;  Location: [facility];  Service: Ophthalmology;  Laterality: Right   CATARACT EXTRACTION W/PHACO Left 11/18/2017   Procedure: CATARACT EXTRACTION PHACO AND INTRAOCULAR LENS PLACEMENT (IOC) LEFT IVA/TOPICAL;  Surgeon: Lockie Mola, MD;  Location: [facility];  Service: Ophthalmology;  Laterality: Left   COLONOSCOPY WITH PROPOFOL N/A 04/22/2013   Procedure: COLONOSCOPY WITH PROPOFOL;  Surgeon: Rachael Fee, MD;  Location: [facility];  Service: Endoscopy;  Laterality: N/A;   CORONARY ANGIOPLASTY WITH STENT PLACEMENT N/A 1996   CORONARY ARTERY BYPASS GRAFT N/A 08/13/2002   Procedure: 4v CORONARY ARTERY BYPASS GRAFTING; Location: [facility]; Surgeon: Katy Apo, MD   CYSTOSCOPY WITH INSERTION OF UROLIFT N/A 12/25/2021   Procedure: CYSTOSCOPY WITH INSERTION OF UROLIFT;   Surgeon: Riki Altes, MD;  Location: [facility];  Service: Urology;  Laterality: N/A;   DG ANGIO AV SHUNT*L*     right and left upper arms   FASCIOTOMY  03/03/2012   Procedure: FASCIOTOMY;  Surgeon: Nicki Reaper, MD;  Location: [facility];  Service: Orthopedics;  Laterality: Right;  FASCIOTOMY RIGHT SMALL FINGER   FASCIOTOMY Left 08/17/2013   Procedure: FASCIOTOMY LEFT RING;  Surgeon: Nicki Reaper, MD;  Location: [facility];  Service: Orthopedics;  Laterality: Left;   INCISION AND DRAINAGE ABSCESS Left 10/15/2015   Procedure: INCISION AND DRAINAGE ABSCESS;  Surgeon: Leafy Ro, MD;  Location: [facility];  Service: General;  Laterality: Left;   KIDNEY TRANSPLANT  09/13/2010   cadaver--at [facility] HEART CATH N/A 11/15/2016   Procedure: RIGHT HEART CATH;  Surgeon: Iran Ouch, MD;  Location: [facility] CV [facility];  Service: Cardiovascular;  Laterality: N/A;   RIGHT HEART CATH N/A 05/03/2019   Procedure: RIGHT HEART CATH;  Surgeon: Laurey Morale, MD;  Location: [facility] INVASIVE CV LAB;  Service: Cardiovascular;  Laterality: N/A;   RIGHT HEART CATH N/A 04/04/2022   Procedure: RIGHT HEART CATH;  Surgeon: Laurey Morale, MD;  Location: [facility] INVASIVE CV LAB;  Service: Cardiovascular;  Laterality: N/A;   ROUX-EN-Y GASTRIC BYPASS N/A 2015   TYMPANIC MEMBRANE REPAIR Left 03/2010   VASECTOMY     WART FULGURATION N/A 05/31/2022   Procedure: FULGURATION ANAL WART;  Surgeon: Sung Amabile, DO;  Location: [facility];  Service: General;  Laterality: N/A;    MEDICATIONS: No current facility-administered medications for this encounter.    albuterol (PROVENTIL) (2.5 MG/3ML) 0.083% nebulizer solution   albuterol (VENTOLIN HFA) 108 (90 Base) MCG/ACT inhaler   amoxicillin-clavulanate (AUGMENTIN) 875-125 MG tablet   apixaban (ELIQUIS) 5 MG TABS tablet   buPROPion (WELLBUTRIN SR) 150 MG 12 hr tablet   cholecalciferol (VITAMIN D3) 25 MCG (1000 UNIT) tablet   colchicine  0.6 MG tablet   Continuous Glucose Sensor (FREESTYLE LIBRE 3 SENSOR) MISC   COVID-19 mRNA vaccine, Pfizer, (COMIRNATY) syringe   cyclobenzaprine (FLEXERIL) 5 MG tablet   dapagliflozin propanediol (FARXIGA) 10 MG TABS tablet   febuxostat (ULORIC) 40 MG tablet   fluticasone (FLONASE) 50 MCG/ACT nasal spray   gabapentin (NEURONTIN) 300 MG capsule   hydrALAZINE (APRESOLINE) 25 MG tablet   HYDROcodone-acetaminophen (NORCO/VICODIN) 5-325 MG tablet   hydrocortisone 2.5 % cream   influenza vac split trivalent PF (FLULAVAL) 0.5 ML injection   insulin glargine-yfgn (SEMGLEE) 100 UNIT/ML Pen   insulin lispro (HUMALOG KWIKPEN)  100 UNIT/ML KwikPen   Insulin Pen Needle (TECHLITE PEN NEEDLES) 31G X 5 MM MISC   Insulin Pen Needle (TECHLITE PEN NEEDLES) 32G X 6 MM MISC   Insulin Pen Needle (UNIFINE PENTIPS) 31G X 5 MM MISC   iron polysaccharides (FERREX 150) 150 MG capsule   isosorbide mononitrate (IMDUR) 60 MG 24 hr tablet   lidocaine (LIDODERM) 5 %   lidocaine (XYLOCAINE) 5 % ointment   losartan (COZAAR) 25 MG tablet   metolazone (ZAROXOLYN) 2.5 MG tablet   montelukast (SINGULAIR) 10 MG tablet   Multiple Vitamin (MULTIVITAMIN WITH MINERALS) TABS tablet   mycophenolate (MYFORTIC) 180 MG EC tablet   omeprazole (PRILOSEC) 20 MG capsule   potassium chloride (KLOR-CON) 10 MEQ tablet   predniSONE (DELTASONE) 5 MG tablet   rosuvastatin (CRESTOR) 10 MG tablet   spironolactone (ALDACTONE) 25 MG tablet   sulfamethoxazole-trimethoprim (BACTRIM) 400-80 MG tablet   tacrolimus (PROGRAF) 1 MG capsule   tamsulosin (FLOMAX) 0.4 MG CAPS capsule   Testosterone 20.25 MG/ACT (1.62%) GEL   torsemide (DEMADEX) 100 MG tablet   zinc sulfate 220 (50 Zn) MG capsule     Jodell Cipro Ward, PA-C [facility] [phone]

## 2023-01-07 NOTE — Progress Notes (Addendum)
COVID Vaccine Completed:  Yes  Date of COVID positive in last 90 days:  11/2022 all symptoms resolved for over 2 weeks  PCP - [name], MD Cardiologist - Marca Ancona, MD/Muhammad Kirke Corin, MD Pulmonologist - Fannie Knee, MD  Chest x-ray - 11-01-22 Epic EKG - 12-17-22 Epic Stress Test - 03-09-21 Epic ECHO - 04-02-22 Epic Cardiac Cath - 04-04-22 Epic Pacemaker/ICD device last checked: Spinal Cord Stimulator: Long Term Monitor - 2020  Bowel Prep - Yes, patient has prep instructions  Sleep Study - Yes, +sleep apnea CPAP - Yes  Freestyle Libre R arm Fasting Blood Sugar - 100to 148 Checks Blood Sugar - several  times a day  Last dose of GLP1 agonist-  N/A GLP1 instructions:  N/A   [name]  Last dose of SGLT-2 inhibitors-  01-06-23 at 5:30  SGLT-2 instructions: Hold until after surgery  Blood Thinner Instructions:  Eliquis last dose 01-06-23 at 5:30 AM Aspirin Instructions: Last Dose:  Activity level:  Can go up a flight of stairs and perform activities of daily living without stopping and without symptoms of chest pain.  Patient has shortness of breath due to CHF, states that currently symptoms are under control.  Anesthesia review:  CAD, HTN, CHF, pulmonary HTN, Afib, OSA, DM, CKD.  Hx of CABG.  Shortness of breath with and without exertion at times.    Patient denies shortness of breath, fever, cough and chest pain at PAT appointment (completed over the phone)  Patient verbalized understanding of instructions that were given to them at the PAT appointment. Patient was also instructed that they will need to review over the PAT instructions again at home before surgery.

## 2023-01-07 NOTE — Progress Notes (Signed)
number of wounds) []  - 0 Wound Tracing (instead of photographs) X- 1 5 Simple Wound Measurement - one wound []  - 0 Complex Wound Measurement - multiple wounds INTERVENTIONS - Wound Dressings X - Small Wound Dressing one or multiple wounds 1 10 []  - 0 Medium Wound Dressing one or multiple wounds []  - 0 Large Wound Dressing one or multiple wounds X- 1 5 Application of Medications - topical []  - 0 Application of Medications - injection INTERVENTIONS - Miscellaneous []  - 0 External ear exam []  - 0 Specimen Collection (cultures, biopsies, blood, body fluids, etc.) []  - 0 Specimen(s) / Culture(s) sent or taken to Lab for analysis [name], [name] ([id number]) [phone].pdf Page 3 of 10 []  - 0 Patient Transfer (multiple staff / Nurse, adult / Similar devices) []  - 0 Simple Staple / Suture removal (25 or less) []  - 0 Complex Staple / Suture removal (26 or more) []  - 0 Hypo / Hyperglycemic Management (close monitor of Blood Glucose) []  - 0 Ankle / Brachial Index (ABI) - do not check if billed separately X- 1 5 Vital Signs Has the patient been seen at the hospital within the last three years: Yes Total Score: 95 Level Of Care: New/Established - Level 3 Electronic Signature(s) Signed: 01/07/2023 4:19:17 PM By: [name] Entered By: [name] on 01/07/2023 05:25:00 -------------------------------------------------------------------------------- Encounter Discharge Information Details Patient Name: Date of Service: Carl Jain [name]. 01/07/2023 7:30 A M Medical Record Number: [id number] Patient Account Number: [id number] Date of Birth/Sex: Treating RN: Aug 27, 1960 (62 y.o. [name] Primary Care Jasaiah Karwowski: [name] Other Clinician: Referring  [name]: Treating Kymberli Wiegand/Extender: Derrek Monaco in Treatment: 4 Encounter Discharge Information Items Discharge Condition: Stable Ambulatory Status: Ambulatory Discharge Destination: Home Transportation: Private Auto Accompanied By: self Schedule Follow-up Appointment: Yes Clinical Summary of Care: Electronic Signature(s) Signed: 01/07/2023 8:26:30 AM By: [name] Entered By: [name] on 01/07/2023 05:26:29 -------------------------------------------------------------------------------- Lower Extremity Assessment Details Patient Name: Date of Service: Carl Jain [name]. 01/07/2023 7:30 A M Medical Record Number: [id number] Patient Account Number: [id number] Date of Birth/Sex: Treating RN: 09/06/1960 (62 y.o. [name] Primary Care Lewanna Petrak: [name] Other Clinician: GAMALIEL, [name] ([id number]) [id number] Page 4 of 10 Referring Aine Strycharz: Treating Artemus Romanoff/Extender: Derrek Monaco in Treatment: 4 Edema Assessment Assessed: [Left: No] [Right: No] [Left: Edema] [Right: :] Calf Left: Right: Point of Measurement: 34 cm From Medial Instep 36.6 cm 37 cm Ankle Left: Right: Point of Measurement: 12 cm From Medial Instep 22 cm 22.5 cm Vascular Assessment Pulses: Dorsalis Pedis Palpable: [Left:Yes] [Right:Yes] Extremity colors, hair growth, and conditions: Extremity Color: [Left:Normal] [Right:Normal] Hair Growth on Extremity: [Left:Yes] [Right:Yes] Temperature of Extremity: [Left:Warm < 3 seconds] [Right:Warm < 3 seconds] Toe Nail Assessment Left: Right: Thick: No No Discolored: No No Deformed: No No Improper Length and Hygiene: No No Electronic Signature(s) Signed: 01/07/2023 4:19:17 PM By: [name] Entered By: [name] on 01/07/2023 04:49:15 -------------------------------------------------------------------------------- Multi Wound Chart Details Patient Name:  Date of Service: Carl Jain [name]. 01/07/2023 7:30 A M Medical Record Number: [id number] Patient Account Number: [id number] Date of Birth/Sex: Treating RN: 03-30-1960 (62 y.o. [name] Primary Care Lalo Tromp: [name] Other Clinician: Referring Vasco Chong: Treating Aleczander Fandino/Extender: Derrek Monaco in Treatment: 4 Vital Signs Height(in): 70 Pulse(bpm): 59 Weight(lbs): 253 Blood Pressure(mmHg): 156/75 Body Mass Index(BMI): 36.3 Temperature([name]): 97.8 Respiratory Rate(breaths/min): 18 [8:Photos:] [N/A:N/A [id number] Page 5 of 10] Left, Circumferential Lower Leg Right, Medial Lower Leg N/A Wound  number of wounds) []  - 0 Wound Tracing (instead of photographs) X- 1 5 Simple Wound Measurement - one wound []  - 0 Complex Wound Measurement - multiple wounds INTERVENTIONS - Wound Dressings X - Small Wound Dressing one or multiple wounds 1 10 []  - 0 Medium Wound Dressing one or multiple wounds []  - 0 Large Wound Dressing one or multiple wounds X- 1 5 Application of Medications - topical []  - 0 Application of Medications - injection INTERVENTIONS - Miscellaneous []  - 0 External ear exam []  - 0 Specimen Collection (cultures, biopsies, blood, body fluids, etc.) []  - 0 Specimen(s) / Culture(s) sent or taken to Lab for analysis [name], [name] ([id number]) [phone].pdf Page 3 of 10 []  - 0 Patient Transfer (multiple staff / Nurse, adult / Similar devices) []  - 0 Simple Staple / Suture removal (25 or less) []  - 0 Complex Staple / Suture removal (26 or more) []  - 0 Hypo / Hyperglycemic Management (close monitor of Blood Glucose) []  - 0 Ankle / Brachial Index (ABI) - do not check if billed separately X- 1 5 Vital Signs Has the patient been seen at the hospital within the last three years: Yes Total Score: 95 Level Of Care: New/Established - Level 3 Electronic Signature(s) Signed: 01/07/2023 4:19:17 PM By: [name] Entered By: [name] on 01/07/2023 05:25:00 -------------------------------------------------------------------------------- Encounter Discharge Information Details Patient Name: Date of Service: Carl Jain [name]. 01/07/2023 7:30 A M Medical Record Number: [id number] Patient Account Number: [id number] Date of Birth/Sex: Treating RN: Aug 27, 1960 (62 y.o. [name] Primary Care Jasaiah Karwowski: [name] Other Clinician: Referring  [name]: Treating Kymberli Wiegand/Extender: Derrek Monaco in Treatment: 4 Encounter Discharge Information Items Discharge Condition: Stable Ambulatory Status: Ambulatory Discharge Destination: Home Transportation: Private Auto Accompanied By: self Schedule Follow-up Appointment: Yes Clinical Summary of Care: Electronic Signature(s) Signed: 01/07/2023 8:26:30 AM By: [name] Entered By: [name] on 01/07/2023 05:26:29 -------------------------------------------------------------------------------- Lower Extremity Assessment Details Patient Name: Date of Service: Carl Jain [name]. 01/07/2023 7:30 A M Medical Record Number: [id number] Patient Account Number: [id number] Date of Birth/Sex: Treating RN: 09/06/1960 (62 y.o. [name] Primary Care Lewanna Petrak: [name] Other Clinician: GAMALIEL, [name] ([id number]) [id number] Page 4 of 10 Referring Aine Strycharz: Treating Artemus Romanoff/Extender: Derrek Monaco in Treatment: 4 Edema Assessment Assessed: [Left: No] [Right: No] [Left: Edema] [Right: :] Calf Left: Right: Point of Measurement: 34 cm From Medial Instep 36.6 cm 37 cm Ankle Left: Right: Point of Measurement: 12 cm From Medial Instep 22 cm 22.5 cm Vascular Assessment Pulses: Dorsalis Pedis Palpable: [Left:Yes] [Right:Yes] Extremity colors, hair growth, and conditions: Extremity Color: [Left:Normal] [Right:Normal] Hair Growth on Extremity: [Left:Yes] [Right:Yes] Temperature of Extremity: [Left:Warm < 3 seconds] [Right:Warm < 3 seconds] Toe Nail Assessment Left: Right: Thick: No No Discolored: No No Deformed: No No Improper Length and Hygiene: No No Electronic Signature(s) Signed: 01/07/2023 4:19:17 PM By: [name] Entered By: [name] on 01/07/2023 04:49:15 -------------------------------------------------------------------------------- Multi Wound Chart Details Patient Name:  Date of Service: Carl Jain [name]. 01/07/2023 7:30 A M Medical Record Number: [id number] Patient Account Number: [id number] Date of Birth/Sex: Treating RN: 03-30-1960 (62 y.o. [name] Primary Care Lalo Tromp: [name] Other Clinician: Referring Vasco Chong: Treating Aleczander Fandino/Extender: Derrek Monaco in Treatment: 4 Vital Signs Height(in): 70 Pulse(bpm): 59 Weight(lbs): 253 Blood Pressure(mmHg): 156/75 Body Mass Index(BMI): 36.3 Temperature([name]): 97.8 Respiratory Rate(breaths/min): 18 [8:Photos:] [N/A:N/A [id number] Page 5 of 10] Left, Circumferential Lower Leg Right, Medial Lower Leg N/A Wound  number of wounds) []  - 0 Wound Tracing (instead of photographs) X- 1 5 Simple Wound Measurement - one wound []  - 0 Complex Wound Measurement - multiple wounds INTERVENTIONS - Wound Dressings X - Small Wound Dressing one or multiple wounds 1 10 []  - 0 Medium Wound Dressing one or multiple wounds []  - 0 Large Wound Dressing one or multiple wounds X- 1 5 Application of Medications - topical []  - 0 Application of Medications - injection INTERVENTIONS - Miscellaneous []  - 0 External ear exam []  - 0 Specimen Collection (cultures, biopsies, blood, body fluids, etc.) []  - 0 Specimen(s) / Culture(s) sent or taken to Lab for analysis [name], [name] ([id number]) [phone].pdf Page 3 of 10 []  - 0 Patient Transfer (multiple staff / Nurse, adult / Similar devices) []  - 0 Simple Staple / Suture removal (25 or less) []  - 0 Complex Staple / Suture removal (26 or more) []  - 0 Hypo / Hyperglycemic Management (close monitor of Blood Glucose) []  - 0 Ankle / Brachial Index (ABI) - do not check if billed separately X- 1 5 Vital Signs Has the patient been seen at the hospital within the last three years: Yes Total Score: 95 Level Of Care: New/Established - Level 3 Electronic Signature(s) Signed: 01/07/2023 4:19:17 PM By: [name] Entered By: [name] on 01/07/2023 05:25:00 -------------------------------------------------------------------------------- Encounter Discharge Information Details Patient Name: Date of Service: Carl Jain [name]. 01/07/2023 7:30 A M Medical Record Number: [id number] Patient Account Number: [id number] Date of Birth/Sex: Treating RN: Aug 27, 1960 (62 y.o. [name] Primary Care Jasaiah Karwowski: [name] Other Clinician: Referring  [name]: Treating Kymberli Wiegand/Extender: Derrek Monaco in Treatment: 4 Encounter Discharge Information Items Discharge Condition: Stable Ambulatory Status: Ambulatory Discharge Destination: Home Transportation: Private Auto Accompanied By: self Schedule Follow-up Appointment: Yes Clinical Summary of Care: Electronic Signature(s) Signed: 01/07/2023 8:26:30 AM By: [name] Entered By: [name] on 01/07/2023 05:26:29 -------------------------------------------------------------------------------- Lower Extremity Assessment Details Patient Name: Date of Service: Carl Jain [name]. 01/07/2023 7:30 A M Medical Record Number: [id number] Patient Account Number: [id number] Date of Birth/Sex: Treating RN: 09/06/1960 (62 y.o. [name] Primary Care Lewanna Petrak: [name] Other Clinician: GAMALIEL, [name] ([id number]) [id number] Page 4 of 10 Referring Aine Strycharz: Treating Artemus Romanoff/Extender: Derrek Monaco in Treatment: 4 Edema Assessment Assessed: [Left: No] [Right: No] [Left: Edema] [Right: :] Calf Left: Right: Point of Measurement: 34 cm From Medial Instep 36.6 cm 37 cm Ankle Left: Right: Point of Measurement: 12 cm From Medial Instep 22 cm 22.5 cm Vascular Assessment Pulses: Dorsalis Pedis Palpable: [Left:Yes] [Right:Yes] Extremity colors, hair growth, and conditions: Extremity Color: [Left:Normal] [Right:Normal] Hair Growth on Extremity: [Left:Yes] [Right:Yes] Temperature of Extremity: [Left:Warm < 3 seconds] [Right:Warm < 3 seconds] Toe Nail Assessment Left: Right: Thick: No No Discolored: No No Deformed: No No Improper Length and Hygiene: No No Electronic Signature(s) Signed: 01/07/2023 4:19:17 PM By: [name] Entered By: [name] on 01/07/2023 04:49:15 -------------------------------------------------------------------------------- Multi Wound Chart Details Patient Name:  Date of Service: Carl Jain [name]. 01/07/2023 7:30 A M Medical Record Number: [id number] Patient Account Number: [id number] Date of Birth/Sex: Treating RN: 03-30-1960 (62 y.o. [name] Primary Care Lalo Tromp: [name] Other Clinician: Referring Vasco Chong: Treating Aleczander Fandino/Extender: Derrek Monaco in Treatment: 4 Vital Signs Height(in): 70 Pulse(bpm): 59 Weight(lbs): 253 Blood Pressure(mmHg): 156/75 Body Mass Index(BMI): 36.3 Temperature([name]): 97.8 Respiratory Rate(breaths/min): 18 [8:Photos:] [N/A:N/A [id number].pdf Page 5 of 10] Left, Circumferential Lower Leg Right, Medial Lower Leg N/A Wound  Referring Harmon Bommarito: Treating Mong Neal/Extender: Derrek Monaco in Treatment: 4 Wound Status Wound Number: 9 Primary Trauma, Other Etiology: Wound Location: Right, Medial Lower Leg Wound Open Wounding Event: Skin Tear/Laceration Status: Date Acquired: 12/24/2022 Comorbid Cataracts, Anemia, Lymphedema, Asthma, Sleep Apnea, Weeks Of Treatment: 2 History: Arrhythmia, Congestive Heart Failure, Coronary Artery Disease, Clustered Wound: No Hypertension, Type II Diabetes, End Stage Renal Disease, Gout, Osteoarthritis, Neuropathy Photos [name], [name] ([id number]) [id number] Page 10 of 10 Wound Measurements Length: (cm) Width: (cm) Depth: (cm) Area: (cm) Volume: (cm) 0 % Reduction in Area: 100% 0 % Reduction in Volume: 100% 0 Epithelialization: None 0 Tunneling: No 0 Undermining: No Wound Description Classification: Full Thickness Without Exposed Suppor Exudate Amount: None Present t Structures Foul Odor After Cleansing: No Slough/Fibrino No Wound Bed Granulation Amount: None Present (0%) Necrotic Amount: None Present (0%) Electronic Signature(s) Signed: 01/07/2023 4:19:17 PM By: [name] Entered By: [name] on 01/07/2023 04:48:21 -------------------------------------------------------------------------------- Vitals Details Patient Name: Date of Service: Carl Jain [name]. 01/07/2023 7:30 A M Medical Record Number: [id number] Patient Account Number: [id number] Date of Birth/Sex: Treating RN: [birth year] (62 y.o. [name] Primary  Care Keevan Wolz: [name] Other Clinician: Referring Taeler Winning: Treating Beyonka Pitney/Extender: Derrek Monaco in Treatment: 4 Vital Signs Time Taken: 07:35 Temperature ([name]): 97.8 Height (in): 70 Pulse (bpm): 59 Weight (lbs): 253 Respiratory Rate (breaths/min): 18 Body Mass Index (BMI): 36.3 Blood Pressure (mmHg): 156/75 Reference Range: 80 - 120 mg / dl Electronic Signature(s) Signed: 01/07/2023 4:19:17 PM By: [name] Entered By: [name] on 01/07/2023 04:50:46  Active Problems Location of Pain Severity and Description of Pain Patient Has Paino No Site Locations Rate the pain. Current Pain Level: 0 Pain Management and Medication Current Pain Management: Electronic Signature(s) Signed: 01/07/2023 4:19:17 PM By: [name] Entered By: [name] on 01/07/2023 04:50:17 -------------------------------------------------------------------------------- Patient/Caregiver Education Details Patient Name:  Date of Service: [name] 10/15/2024andnbsp7:30 A M Medical Record Number: [id number] Patient Account Number: [id number] Date of Birth/Gender: Treating RN: 05-17-1960 (62 y.o. [name] Primary Care Physician: [name] Other Clinician: Referring Physician: Treating Physician/Extender: Derrek Monaco in Treatment: 4 Education Assessment Education Provided To: Patient Education Topics Provided Wound/Skin Impairment: Handouts: Caring for Your Ulcer Methods: Explain/Verbal Responses: State content correctly Electronic Signature(s) [name], [name] ([id number]) [id number].pdf Page 8 of 10 Signed: 01/07/2023 4:19:17 PM By: [name] Entered By: [name] on 01/07/2023 05:25:46 -------------------------------------------------------------------------------- Wound Assessment Details Patient Name: Date of Service: [name], [name] 01/07/2023 7:30 A M Medical Record Number: [id number] Patient Account Number: [id number] Date of Birth/Sex: Treating RN: [birth year] (62 y.o. [name] Primary Care Kariah Loredo: [name] Other Clinician: Referring Jahnae Mcadoo: Treating Edit Ricciardelli/Extender: Derrek Monaco in Treatment: 4 Wound Status Wound Number: 8 Primary Skin T ear Etiology: Wound Location: Left, Circumferential Lower Leg Wound Open Wounding Event: Skin Tear/Laceration Status: Date Acquired: 11/30/2022 Comorbid Cataracts, Anemia, Lymphedema, Asthma, Sleep Apnea, Weeks Of Treatment: 4 History: Arrhythmia, Congestive Heart Failure, Coronary Artery Disease, Clustered Wound: No Hypertension, Type II Diabetes, End Stage Renal Disease, Gout, Osteoarthritis, Neuropathy Photos Wound Measurements Length: (cm) 5.6 Width: (cm) 9.8 Depth: (cm) 0.1 Area: (cm) 43.103 Volume: (cm) 4.31 % Reduction in Area: 67.1% % Reduction in Volume: 67.1% Epithelialization: Small (1-33%) Tunneling:  No Undermining: No Wound Description Classification: Full Thickness Without Exposed Suppor Exudate Amount: Medium Exudate Type: Serosanguineous Exudate Color: red, brown t Structures Foul Odor After Cleansing: No Slough/Fibrino Yes Wound Bed Granulation Amount: Large (67-100%) Exposed Structure Granulation Quality: Red, Pink Fat Layer (Subcutaneous Tissue) Exposed: Yes Necrotic Amount: Small (1-33%) Necrotic Quality: Adherent Slough Treatment Notes Wound #8 (Lower Leg) Wound Laterality: Left, Circumferential Cleanser Soap and [address] MAVRICK, MCQUIGG [name] ([id number]) [id number] Page 9 of 10 Discharge Instruction: Gently cleanse wound with antibacterial soap, rinse and pat dry prior to dressing wounds Wound Cleanser Discharge Instruction: Wash your hands with soap and water. Remove old dressing, discard into plastic bag and place into trash. Cleanse the wound with Wound Cleanser prior to applying a clean dressing using gauze sponges, not tissues or cotton balls. Do not scrub or use excessive force. Pat dry using gauze sponges, not tissue or cotton balls. Peri-Wound Care Topical Primary Dressing Promogran Matrix 4.34 (in) Discharge Instruction: only on lower left leg wound Moisten w/normal saline or sterile water; Cover wound as directed. Do not remove from wound bed. Xeroform 5x9-HBD (in/in) Discharge Instruction: Apply Xeroform 5x9-HBD (in/in) as directed Secondary Dressing ABD Pad 5x9 (in/in) Discharge Instruction: Cover with ABD pad Secured With Medipore T - 58M Medipore H Soft Cloth Surgical T ape ape, 2x2 (in/yd) Kerlix Roll Sterile or Non-Sterile 6-ply 4.5x4 (yd/yd) Discharge Instruction: Apply Kerlix as directed Compression Wrap Compression Stockings Add-Ons Electronic Signature(s) Signed: 01/07/2023 4:19:17 PM By: [name] Entered By: [name] on 01/07/2023  04:47:44 -------------------------------------------------------------------------------- Wound Assessment Details Patient Name: Date of Service: Carl Jain [name]. 01/07/2023 7:30 A M Medical Record Number: [id number] Patient Account Number: [id number] Date of Birth/Sex: Treating RN: 24-Jan-1961 (62 y.o. [name] Primary Care Seab Axel: [name] Other Clinician:

## 2023-01-07 NOTE — Anesthesia Preprocedure Evaluation (Addendum)
Anesthesia Evaluation  Patient identified by MRN, date of birth, ID band Patient awake    Reviewed: Allergy & Precautions, NPO status , Patient's Chart, lab work & pertinent test results  History of Anesthesia Complications Negative for: history of anesthetic complications  Airway Mallampati: II  TM Distance: >3 FB Neck ROM: Full    Dental  (+) Dental Advisory Given, Chipped   Pulmonary asthma , sleep apnea (BiPAP) , COPD,  COPD inhaler, former smoker   breath sounds clear to auscultation       Cardiovascular hypertension, Pt. on medications pulmonary hypertension+ angina (last chest pain during work out saturday, no NTG needed) with exertion + CAD, + Cardiac Stents, + CABG and +CHF   Rhythm:Regular Rate:Normal  03/2022 ECHO: EF 40-45%, mildly reduced LVF with global hypokinesis,  mild LVH, mod reduced RVF, mod pulm HTN, mild MR   Neuro/Psych    Depression    negative neurological ROS     GI/Hepatic Neg liver ROS,GERD  Medicated and Controlled,,S/p gastric bypass   Endo/Other  diabetes, Insulin Dependent  BMI 35.4  Renal/GU Renal InsufficiencyRenal diseaseS/p renal transplant     Musculoskeletal  (+) Arthritis ,    Abdominal  (+) + obese  Peds  Hematology eliquis   Anesthesia Other Findings   Reproductive/Obstetrics                             Anesthesia Physical Anesthesia Plan  ASA: 4  Anesthesia Plan: General   Post-op Pain Management: Tylenol PO (pre-op)*   Induction: Intravenous  PONV Risk Score and Plan: 2 and Ondansetron and Dexamethasone  Airway Management Planned: Oral ETT  Additional Equipment: ClearSight  Intra-op Plan:   Post-operative Plan: Extubation in OR  Informed Consent: I have reviewed the patients History and Physical, chart, labs and discussed the procedure including the risks, benefits and alternatives for the proposed anesthesia with the patient or  authorized representative who has indicated his/her understanding and acceptance.     Dental advisory given  Plan Discussed with: CRNA and Surgeon  Anesthesia Plan Comments: (See PAT note 01/07/2023)       Anesthesia Quick Evaluation

## 2023-01-08 ENCOUNTER — Encounter (HOSPITAL_COMMUNITY)
Admission: RE | Disposition: A | Discharge status: Home / Self Care | Payer: Self-pay | Source: Home / Self Care | Attending: Surgery

## 2023-01-08 ENCOUNTER — Ambulatory Visit: Admission: RE | Admit: 2023-01-08 | Payer: Commercial Managed Care - PPO | Source: Ambulatory Visit

## 2023-01-08 ENCOUNTER — Other Ambulatory Visit: Payer: Self-pay

## 2023-01-08 ENCOUNTER — Encounter (HOSPITAL_COMMUNITY): Payer: Self-pay | Admitting: Surgery

## 2023-01-08 ENCOUNTER — Ambulatory Visit (HOSPITAL_BASED_OUTPATIENT_CLINIC_OR_DEPARTMENT_OTHER): Payer: Self-pay | Admitting: Anesthesiology

## 2023-01-08 ENCOUNTER — Ambulatory Visit (HOSPITAL_COMMUNITY): Payer: Self-pay | Admitting: Anesthesiology

## 2023-01-08 ENCOUNTER — Ambulatory Visit (HOSPITAL_COMMUNITY)
Admission: RE | Admit: 2023-01-08 | Discharge: 2023-01-08 | Disposition: A | Discharge status: Home / Self Care | Payer: Commercial Managed Care - PPO | Attending: Surgery | Admitting: Surgery

## 2023-01-08 DIAGNOSIS — J4489 Other specified chronic obstructive pulmonary disease: Secondary | ICD-10-CM | POA: Diagnosis not present

## 2023-01-08 DIAGNOSIS — A5131 Condyloma latum: Secondary | ICD-10-CM | POA: Diagnosis not present

## 2023-01-08 DIAGNOSIS — Z940 Kidney transplant status: Secondary | ICD-10-CM | POA: Insufficient documentation

## 2023-01-08 DIAGNOSIS — I509 Heart failure, unspecified: Secondary | ICD-10-CM | POA: Diagnosis not present

## 2023-01-08 DIAGNOSIS — E1122 Type 2 diabetes mellitus with diabetic chronic kidney disease: Secondary | ICD-10-CM | POA: Insufficient documentation

## 2023-01-08 DIAGNOSIS — Z7984 Long term (current) use of oral hypoglycemic drugs: Secondary | ICD-10-CM | POA: Insufficient documentation

## 2023-01-08 DIAGNOSIS — M1990 Unspecified osteoarthritis, unspecified site: Secondary | ICD-10-CM | POA: Diagnosis not present

## 2023-01-08 DIAGNOSIS — I272 Pulmonary hypertension, unspecified: Secondary | ICD-10-CM | POA: Insufficient documentation

## 2023-01-08 DIAGNOSIS — G4733 Obstructive sleep apnea (adult) (pediatric): Secondary | ICD-10-CM

## 2023-01-08 DIAGNOSIS — G4730 Sleep apnea, unspecified: Secondary | ICD-10-CM | POA: Insufficient documentation

## 2023-01-08 DIAGNOSIS — N189 Chronic kidney disease, unspecified: Secondary | ICD-10-CM | POA: Diagnosis not present

## 2023-01-08 DIAGNOSIS — A630 Anogenital (venereal) warts: Secondary | ICD-10-CM | POA: Diagnosis present

## 2023-01-08 DIAGNOSIS — I2510 Atherosclerotic heart disease of native coronary artery without angina pectoris: Secondary | ICD-10-CM

## 2023-01-08 DIAGNOSIS — Z955 Presence of coronary angioplasty implant and graft: Secondary | ICD-10-CM | POA: Insufficient documentation

## 2023-01-08 DIAGNOSIS — I25119 Atherosclerotic heart disease of native coronary artery with unspecified angina pectoris: Secondary | ICD-10-CM | POA: Insufficient documentation

## 2023-01-08 DIAGNOSIS — E119 Type 2 diabetes mellitus without complications: Secondary | ICD-10-CM

## 2023-01-08 DIAGNOSIS — I10 Essential (primary) hypertension: Secondary | ICD-10-CM | POA: Diagnosis not present

## 2023-01-08 DIAGNOSIS — I130 Hypertensive heart and chronic kidney disease with heart failure and stage 1 through stage 4 chronic kidney disease, or unspecified chronic kidney disease: Secondary | ICD-10-CM | POA: Insufficient documentation

## 2023-01-08 DIAGNOSIS — Z794 Long term (current) use of insulin: Secondary | ICD-10-CM | POA: Insufficient documentation

## 2023-01-08 DIAGNOSIS — K6282 Dysplasia of anus: Secondary | ICD-10-CM | POA: Diagnosis not present

## 2023-01-08 DIAGNOSIS — Z951 Presence of aortocoronary bypass graft: Secondary | ICD-10-CM | POA: Insufficient documentation

## 2023-01-08 DIAGNOSIS — F32A Depression, unspecified: Secondary | ICD-10-CM | POA: Diagnosis not present

## 2023-01-08 DIAGNOSIS — Z87891 Personal history of nicotine dependence: Secondary | ICD-10-CM | POA: Insufficient documentation

## 2023-01-08 HISTORY — PX: RECTAL EXAM UNDER ANESTHESIA: SHX6399

## 2023-01-08 HISTORY — DX: Presence of external hearing-aid: Z97.4

## 2023-01-08 HISTORY — PX: LASER ABLATION CONDOLAMATA: SHX5941

## 2023-01-08 LAB — GLUCOSE, CAPILLARY
Glucose-Capillary: 208 mg/dL — ABNORMAL HIGH (ref 70–99)
Glucose-Capillary: 182 mg/dL — ABNORMAL HIGH (ref 70–99)

## 2023-01-08 LAB — HEMOGLOBIN A1C
Hgb A1c MFr Bld: 8.3 % — ABNORMAL HIGH (ref 4.8–5.6)
Mean Plasma Glucose: 191.51 mg/dL

## 2023-01-08 SURGERY — ABLATION, CONDYLOMA, USING LASER
Anesthesia: General

## 2023-01-08 MED ORDER — CHLORHEXIDINE GLUCONATE 0.12 % MT SOLN
15.0000 mL | Freq: Once | OROMUCOSAL | Status: AC
  Administered 2023-01-08: 15 mL via OROMUCOSAL

## 2023-01-08 MED ORDER — OXYCODONE HCL 5 MG PO TABS
ORAL_TABLET | ORAL | Status: AC
  Filled 2023-01-08: qty 1

## 2023-01-08 MED ORDER — GABAPENTIN 300 MG PO CAPS
300.0000 mg | ORAL_CAPSULE | ORAL | Status: AC
  Administered 2023-01-08: 300 mg via ORAL
  Filled 2023-01-08: qty 1

## 2023-01-08 MED ORDER — OXYCODONE HCL 5 MG PO TABS
5.0000 mg | ORAL_TABLET | Freq: Once | ORAL | Status: AC | PRN
  Administered 2023-01-08: 5 mg via ORAL

## 2023-01-08 MED ORDER — SODIUM CHLORIDE (PF) 0.9 % IJ SOLN
INTRAMUSCULAR | Status: AC
  Filled 2023-01-08: qty 10

## 2023-01-08 MED ORDER — INSULIN ASPART 100 UNIT/ML IJ SOLN
0.0000 [IU] | INTRAMUSCULAR | Status: DC | PRN
  Administered 2023-01-08: 4 [IU] via SUBCUTANEOUS

## 2023-01-08 MED ORDER — OXYCODONE HCL 5 MG/5ML PO SOLN: 5.0000 mg | Freq: Once | ORAL | Status: AC | PRN

## 2023-01-08 MED ORDER — BUPIVACAINE-EPINEPHRINE 0.5% -1:200000 IJ SOLN
INTRAMUSCULAR | Status: DC | PRN
  Administered 2023-01-08: 30 mL

## 2023-01-08 MED ORDER — CHLORHEXIDINE GLUCONATE CLOTH 2 % EX PADS: 6.0000 | MEDICATED_PAD | Freq: Once | CUTANEOUS | Status: DC

## 2023-01-08 MED ORDER — DIBUCAINE (PERIANAL) 1 % EX OINT
TOPICAL_OINTMENT | CUTANEOUS | Status: DC | PRN
  Administered 2023-01-08: 1 via RECTAL

## 2023-01-08 MED ORDER — ROCURONIUM BROMIDE 10 MG/ML (PF) SYRINGE
PREFILLED_SYRINGE | INTRAVENOUS | Status: AC
  Filled 2023-01-08: qty 10

## 2023-01-08 MED ORDER — LIDOCAINE HCL (PF) 2 % IJ SOLN
INTRAMUSCULAR | Status: AC
  Filled 2023-01-08: qty 5

## 2023-01-08 MED ORDER — FENTANYL CITRATE (PF) 100 MCG/2ML IJ SOLN
INTRAMUSCULAR | Status: AC
  Filled 2023-01-08: qty 2

## 2023-01-08 MED ORDER — AMISULPRIDE (ANTIEMETIC) 5 MG/2ML IV SOLN
INTRAVENOUS | Status: AC
  Filled 2023-01-08: qty 4

## 2023-01-08 MED ORDER — LACTATED RINGERS IV SOLN: INTRAVENOUS | Status: DC

## 2023-01-08 MED ORDER — PROPOFOL 10 MG/ML IV BOLUS
INTRAVENOUS | Status: AC
  Filled 2023-01-08: qty 20

## 2023-01-08 MED ORDER — HYDROMORPHONE HCL 1 MG/ML IJ SOLN
INTRAMUSCULAR | Status: AC
  Administered 2023-01-08: 0.5 mg via INTRAVENOUS
  Filled 2023-01-08: qty 1

## 2023-01-08 MED ORDER — MEPERIDINE HCL 50 MG/ML IJ SOLN: 6.2500 mg | INTRAMUSCULAR | Status: DC | PRN

## 2023-01-08 MED ORDER — MIDAZOLAM HCL 2 MG/2ML IJ SOLN: 0.5000 mg | Freq: Once | INTRAMUSCULAR | Status: DC | PRN

## 2023-01-08 MED ORDER — ACETIC ACID 5 % SOLN
Status: AC
  Filled 2023-01-08: qty 50

## 2023-01-08 MED ORDER — ONDANSETRON HCL 4 MG/2ML IJ SOLN
INTRAMUSCULAR | Status: AC
  Filled 2023-01-08: qty 2

## 2023-01-08 MED ORDER — HYDROMORPHONE HCL 1 MG/ML IJ SOLN
0.2500 mg | INTRAMUSCULAR | Status: DC | PRN
  Administered 2023-01-08 (×2): 0.5 mg via INTRAVENOUS

## 2023-01-08 MED ORDER — ACETAMINOPHEN 500 MG PO TABS
1000.0000 mg | ORAL_TABLET | Freq: Once | ORAL | Status: AC
  Administered 2023-01-08: 1000 mg via ORAL
  Filled 2023-01-08: qty 2

## 2023-01-08 MED ORDER — PHENYLEPHRINE 80 MCG/ML (10ML) SYRINGE FOR IV PUSH (FOR BLOOD PRESSURE SUPPORT)
PREFILLED_SYRINGE | INTRAVENOUS | Status: DC | PRN
Start: 2023-01-08 — End: 2023-01-08
  Administered 2023-01-08 (×2): 80 ug via INTRAVENOUS
  Administered 2023-01-08: 160 ug via INTRAVENOUS

## 2023-01-08 MED ORDER — BUPIVACAINE LIPOSOME 1.3 % IJ SUSP
INTRAMUSCULAR | Status: DC | PRN
  Administered 2023-01-08: 20 mL

## 2023-01-08 MED ORDER — BUPIVACAINE LIPOSOME 1.3 % IJ SUSP
INTRAMUSCULAR | Status: AC
  Filled 2023-01-08: qty 20

## 2023-01-08 MED ORDER — AMISULPRIDE (ANTIEMETIC) 5 MG/2ML IV SOLN
10.0000 mg | Freq: Once | INTRAVENOUS | Status: AC
  Administered 2023-01-08: 10 mg via INTRAVENOUS

## 2023-01-08 MED ORDER — LIDOCAINE HCL (CARDIAC) PF 100 MG/5ML IV SOSY
PREFILLED_SYRINGE | INTRAVENOUS | Status: DC | PRN
  Administered 2023-01-08: 20 mg via INTRAVENOUS

## 2023-01-08 MED ORDER — SUGAMMADEX SODIUM 200 MG/2ML IV SOLN
INTRAVENOUS | Status: DC | PRN
Start: 2023-01-08 — End: 2023-01-08
  Administered 2023-01-08: 450 mg via INTRAVENOUS

## 2023-01-08 MED ORDER — ORAL CARE MOUTH RINSE: 15.0000 mL | Freq: Once | OROMUCOSAL | Status: AC

## 2023-01-08 MED ORDER — METHYLENE BLUE (ANTIDOTE) 1 % IV SOLN
INTRAVENOUS | Status: AC
  Filled 2023-01-08: qty 10

## 2023-01-08 MED ORDER — MIDAZOLAM HCL 2 MG/2ML IJ SOLN
INTRAMUSCULAR | Status: AC
  Filled 2023-01-08: qty 2

## 2023-01-08 MED ORDER — ACETAMINOPHEN 500 MG PO TABS: 1000.0000 mg | ORAL_TABLET | ORAL | Status: DC

## 2023-01-08 MED ORDER — PROPOFOL 10 MG/ML IV BOLUS
INTRAVENOUS | Status: DC | PRN
  Administered 2023-01-08: 100 mg via INTRAVENOUS

## 2023-01-08 MED ORDER — METHYLPREDNISOLONE SODIUM SUCC 125 MG IJ SOLR
INTRAMUSCULAR | Status: DC | PRN
Start: 2023-01-08 — End: 2023-01-08
  Administered 2023-01-08: 125 mg via INTRAVENOUS

## 2023-01-08 MED ORDER — MIDAZOLAM HCL 5 MG/5ML IJ SOLN
INTRAMUSCULAR | Status: DC | PRN
  Administered 2023-01-08: 2 mg via INTRAVENOUS

## 2023-01-08 MED ORDER — ROCURONIUM BROMIDE 100 MG/10ML IV SOLN
INTRAVENOUS | Status: DC | PRN
  Administered 2023-01-08: 60 mg via INTRAVENOUS
  Administered 2023-01-08: 20 mg via INTRAVENOUS

## 2023-01-08 MED ORDER — ACETIC ACID 5 % SOLN
Status: DC | PRN
Start: 2023-01-08 — End: 2023-01-08
  Administered 2023-01-08: 1 via TOPICAL

## 2023-01-08 MED ORDER — HYDROCODONE-ACETAMINOPHEN 5-325 MG PO TABS
1.0000 | ORAL_TABLET | Freq: Four times a day (QID) | ORAL | 0 refills | Status: DC | PRN
  Filled 2023-01-08: qty 30, 4d supply, fill #0

## 2023-01-08 MED ORDER — FENTANYL CITRATE (PF) 100 MCG/2ML IJ SOLN
INTRAMUSCULAR | Status: DC | PRN
  Administered 2023-01-08: 100 ug via INTRAVENOUS

## 2023-01-08 MED ORDER — DEXAMETHASONE SODIUM PHOSPHATE 10 MG/ML IJ SOLN
INTRAMUSCULAR | Status: AC
  Filled 2023-01-08: qty 1

## 2023-01-08 MED ORDER — DIBUCAINE (PERIANAL) 1 % EX OINT
TOPICAL_OINTMENT | CUTANEOUS | Status: AC
  Filled 2023-01-08: qty 28

## 2023-01-08 MED ORDER — METHYLPREDNISOLONE SODIUM SUCC 125 MG IJ SOLR
INTRAMUSCULAR | Status: AC
  Filled 2023-01-08: qty 2

## 2023-01-08 MED ORDER — ONDANSETRON HCL 4 MG/2ML IJ SOLN
INTRAMUSCULAR | Status: DC | PRN
  Administered 2023-01-08: 4 mg via INTRAVENOUS

## 2023-01-08 MED ORDER — GABAPENTIN 300 MG PO CAPS
ORAL_CAPSULE | ORAL | Status: AC
  Filled 2023-01-08: qty 1

## 2023-01-08 MED ORDER — BUPIVACAINE-EPINEPHRINE (PF) 0.5% -1:200000 IJ SOLN
INTRAMUSCULAR | Status: AC
  Filled 2023-01-08: qty 30

## 2023-01-08 MED ORDER — BUPIVACAINE LIPOSOME 1.3 % IJ SUSP: 20.0000 mL | Freq: Once | INTRAMUSCULAR | Status: DC

## 2023-01-08 SURGICAL SUPPLY — 36 items
APL SKNCLS STERI-STRIP NONHPOA (GAUZE/BANDAGES/DRESSINGS) ×2
BAG COUNTER SPONGE SURGICOUNT (BAG) IMPLANT
BAG SPNG CNTER NS LX DISP (BAG)
BENZOIN TINCTURE PRP APPL 2/3 (GAUZE/BANDAGES/DRESSINGS) ×1 IMPLANT
BLADE SURG 15 STRL LF DISP TIS (BLADE) IMPLANT
BLADE SURG 15 STRL SS (BLADE) ×1
BRIEF MESH DISP LRG (UNDERPADS AND DIAPERS) ×1 IMPLANT
CNTNR URN SCR LID CUP LEK RST (MISCELLANEOUS) ×1 IMPLANT
CONT SPEC 4OZ STRL OR WHT (MISCELLANEOUS) ×1
COVER SURGICAL LIGHT HANDLE (MISCELLANEOUS) ×1 IMPLANT
DRAPE LAPAROTOMY T 102X78X121 (DRAPES) ×1 IMPLANT
ELECT REM PT RETURN 15FT ADLT (MISCELLANEOUS) ×1 IMPLANT
GAUZE 4X4 16PLY ~~LOC~~+RFID DBL (SPONGE) ×1 IMPLANT
GAUZE PAD ABD 8X10 STRL (GAUZE/BANDAGES/DRESSINGS) IMPLANT
GAUZE SPONGE 4X4 12PLY STRL (GAUZE/BANDAGES/DRESSINGS) IMPLANT
GLOVE ECLIPSE 8.0 STRL XLNG CF (GLOVE) ×1 IMPLANT
GLOVE INDICATOR 8.0 STRL GRN (GLOVE) ×1 IMPLANT
GOWN STRL REUS W/ TWL XL LVL3 (GOWN DISPOSABLE) ×3 IMPLANT
GOWN STRL REUS W/TWL XL LVL3 (GOWN DISPOSABLE) ×3
KIT BASIN OR (CUSTOM PROCEDURE TRAY) ×1 IMPLANT
KIT TURNOVER KIT A (KITS) IMPLANT
LOOP VESSEL MAXI BLUE (MISCELLANEOUS) IMPLANT
NDL HYPO 22X1.5 SAFETY MO (MISCELLANEOUS) ×1 IMPLANT
NEEDLE HYPO 22X1.5 SAFETY MO (MISCELLANEOUS) ×1 IMPLANT
PACK BASIC VI WITH GOWN DISP (CUSTOM PROCEDURE TRAY) ×1 IMPLANT
PENCIL BUTTON HOLSTER BLD 10FT (ELECTRODE) ×1 IMPLANT
SCRUB CHG 4% DYNA-HEX 4OZ (MISCELLANEOUS) ×1 IMPLANT
SPIKE FLUID TRANSFER (MISCELLANEOUS) ×1 IMPLANT
SURGILUBE 2OZ TUBE FLIPTOP (MISCELLANEOUS) ×1 IMPLANT
SUT CHROMIC 2 0 SH (SUTURE) ×1 IMPLANT
SUT VIC AB 2-0 UR6 27 (SUTURE) ×6 IMPLANT
SYR 20ML LL LF (SYRINGE) ×1 IMPLANT
SYR 3ML LL SCALE MARK (SYRINGE) IMPLANT
TOWEL OR 17X26 10 PK STRL BLUE (TOWEL DISPOSABLE) ×1 IMPLANT
TOWEL OR NON WOVEN STRL DISP B (DISPOSABLE) ×1 IMPLANT
YANKAUER SUCT BULB TIP 10FT TU (MISCELLANEOUS) ×1 IMPLANT

## 2023-01-08 NOTE — Anesthesia Postprocedure Evaluation (Signed)
Anesthesia Post Note  Patient: [name].  Procedure(s) Performed: LASER REMOVAL ABLATION OF CONDYLOMATA ANORECTAL EXAM UNDER ANESTHESIA     Patient location during evaluation: Phase II Anesthesia Type: General Level of consciousness: awake and alert, patient cooperative and oriented Pain management: pain level controlled Vital Signs Assessment: post-procedure vital signs reviewed and stable Respiratory status: spontaneous breathing, nonlabored ventilation and respiratory function stable Cardiovascular status: blood pressure returned to baseline and stable Postop Assessment: no apparent nausea or vomiting and able to ambulate Anesthetic complications: no   No notable events documented.  Last Vitals:  Vitals:   01/08/23 1045 01/08/23 1100  BP: 126/75 121/80  Pulse:  (!) 48  Resp:  12  Temp:  36.6 C  SpO2: 95% 97%    Last Pain:  Vitals:   01/08/23 1100  TempSrc:   PainSc: 4                  Zaevion Parke,E. Orlanda Lemmerman

## 2023-01-08 NOTE — H&P (Signed)
01/08/2023   REFERRING PHYSICIAN: Sung Amabile, DO  Patient Care Team: [name], MD as PCP - General (Family Medicine) Laurey Morale, MD (Cardiovascular Disease) Rachael Fee, MD (Gastroenterology) Michaell Cowing, Shawn Route, MD as Consulting Provider (Colon and Rectal Surgery) Sung Amabile, DO as Consulting Provider (General Surgery) Larwance Rote as Physician Assistant (Transplant Surgery)  PROVIDER: Jarrett Soho, MD  [facility] MRN: [id number] DOB: 05/30/1960  SUBJECTIVE   Chief Complaint: New Consultation (Anal condyloma)   [name] is a 62 y.o. male  who is seen today as an office consultation  at the request of DrTonna Boehringer  for evaluation of anal condyloma   History of Present Illness:  Patient with numerous medical problems including cardiovascular disease and diabetes. Chronically anticoagulated on apixaban. History of morbid obesity status post gastric bypass in 2015 with BMI 37. Renal failure on dialysis for many years. Underwent cadaveric renal transplant through [facility] in 2012. Was found to have perianal masses and underwent fulguration and excision by Dr. Tonna Boehringer at [facility] 05/31/2022. No high-grade dysplasia or malignancy. He mentioned considering follow-up with colorectal surgery. That had not happened. Patient brought up to his primary care physician last month. Discussed with Dr. Tonna Boehringer. Referral placed.  Patient comes by himself. History of colon polyps. Last colonoscopy 2021 by Dr. Christella Hartigan with adenomatous polyp. 3-year screening given history of transplant and polyps. That has not been set up yet. Patient claims he walks a mile every other day. He had a gastric bypass 9 years ago Dr. Effie Shy, [facility] surgeon who operated in [facility] back in 2015. Patient moves his bowels most days. He will A1c somewhat elevated but glucoses in the 130s now. Followed by Dr. Sherlie Ban for his atrial fibrillation and other cardiac issues.  Patient had recent fall with some rib fractures and April. Blood pressure medications adjusted. No major issues since.  Medical History:  Past Medical History:  Diagnosis Date  Allergy  seasonal  Amnestic MCI (mild cognitive impairment with memory loss)  Aortic atherosclerosis (CMS-HCC)  on CT scan 10/2018  Asthma, unspecified asthma severity, unspecified whether complicated, unspecified whether persistent (HHS-HCC)  Chronic a-fib (CMS/HHS-HCC)  Chronic venous stasis dermatitis of both lower extremities  Congestive heart failure (CHF) (CMS/HHS-HCC)  Hospitalization for CHF exacerbation 02/2019  Coronary artery disease  Cytomegaloviral disease (CMS/HHS-HCC)  Depression  Diabetic retinopathy associated with type 2 diabetes mellitus (CMS/HHS-HCC)  Status post laser eye surgery 2011.  Diverticulosis  Erectile dysfunction  ESRD (end stage renal disease) (CMS/HHS-HCC)  HD 2004-2012; S/p renal transplant in 2012  GERD (gastroesophageal reflux disease)  Gout  History of renal failure  after dye-induced nephrotoxicity, s/p kidney transplant in 08/2010  Hyperlipidemia  Hypertension  Ingrowing nail  bilateral great toes  Kidney transplant status, cadaveric (HHS-HCC)  Morbid obesity (CMS/HHS-HCC)  Multilevel degenerative disc disease  on CT scan 10/2018  OSA on CPAP  PAH (pulmonary artery hypertension) (CMS/HHS-HCC)  Pneumonia  Secondary hyperparathyroidism, renal (CMS/HHS-HCC)  Sleep apnea  bipap with oxygen 2 L  Trigger finger   Patient Active Problem List  Diagnosis  Type II or unspecified type diabetes mellitus with ophthalmic manifestations, not stated as uncontrolled(250.50) (CMS/HHS-HCC)  History of renal transplant (HHS-HCC)  Type 2 diabetes mellitus without complication, with long-term current use of insulin (CMS/HHS-HCC)  Type 2 diabetes mellitus with hyperglycemia, with long-term current use of insulin (CMS/HHS-HCC)  Type 2 diabetes mellitus with both eyes affected by  mild nonproliferative retinopathy without macular edema, with long-term current use  of insulin (CMS/HHS-HCC)  Type 2 diabetes mellitus with stage 3 chronic kidney disease, with long-term current use of insulin (CMS/HHS-HCC)  Diabetes mellitus type 2 in obese (CMS/HHS-HCC)  Essential (primary) hypertension  Aortic atherosclerosis (CMS-HCC)  Congestive heart failure (CMS-HCC)  Hospitalization within last 30 days  Polyarticular gout  Encounter for long-term (current) use of high-risk medication  Rheumatoid factor positive  Need for hepatitis C screening test  Rheumatoid arthritis involving multiple sites with positive rheumatoid factor (CMS/HHS-HCC)  Encounters for administrative purpose  Immunosuppressed status (CMS/HHS-HCC)  Anal condyloma  Hx of adenomatous colonic polyps  Chronic anticoagulation  Longstanding persistent atrial fibrillation (CMS/HHS-HCC)  Diastasis recti   Past Surgical History:  Procedure Laterality Date  CORONARY ARTERY BYPASS GRAFT 2006  CABG (4V)  Laser eye surgery 2011  TRANSPLANT KIDNEY 08/2010  EXAMINATION UNDER ANESTHESIA 05/31/2022  Dr. Sung Amabile  bariatric surgery  cardiac catheterization  CATARACT EXTRACTION Bilateral  10-29-2017 (right) and 11-18-2017 (left)  COLONOSCOPY  dg angio av shunt  right and left upper arms  FASCIOTOMY HAND  right small finger  FASCIOTOMY HAND  left ring finger  Hand surgery Left  REPAIR TYMPANIC MEMBRANE Left  Urolift  VASECTOMY    Allergies  Allergen Reactions  Iodine Other (See Comments)  Other reaction(s): Other (See Comments) Kidney transplant  Acetaminophen Other (See Comments)  BC of transplant  Ibuprofen Other (See Comments)  Due to kidney transplant Due to kidney transplant  Iodinated Contrast Media Other (See Comments)  Kidney transplant   Current Outpatient Medications on File Prior to Visit  Medication Sig Dispense Refill  albuterol (PROVENTIL) 2.5 mg /3 mL (0.083 %) nebulizer solution  Inhale 2.5 mg into the lungs every 6 (six) hours as needed  albuterol MDI, PROVENTIL, VENTOLIN, PROAIR, HFA 90 mcg/actuation inhaler Inhale 2 Inhalations into the lungs every 6 (six) hours as needed  amoxicillin-clavulanate (AUGMENTIN) 875-125 mg tablet Take 1 tablet by mouth 2 (two) times daily  apixaban (ELIQUIS) 5 mg tablet Take 5 mg by mouth every 12 (twelve) hours.   buPROPion (WELLBUTRIN XL) 150 MG XL tablet Take 150 mg by mouth 2 (two) times daily  cholecalciferol (VITAMIN D3) 1000 unit tablet Take by mouth once daily  colchicine (COLCRYS) 0.6 mg tablet Take 1 tablet (0.6 mg total) by mouth every other day for 180 days 45 tablet 1  dapagliflozin (FARXIGA) 10 mg tablet Take 10 mg by mouth once daily  Febuxostat (ULORIC) 40 mg tablet Take 3 tablets (120 mg total) by mouth once daily 90 tablet 5  fluticasone propionate (FLONASE) 50 mcg/actuation nasal spray Place 2 sprays into both nostrils once daily  FREESTYLE LIBRE 3 SENSOR Devi Use one every 2 weeks. 6 each 3  gabapentin (NEURONTIN) 300 MG capsule TAKE 1 CAPSULE BY MOUTH TWICE DAILY 180 capsule 3  hydrALAZINE (APRESOLINE) 50 MG tablet Take by mouth  HYDROcodone-acetaminophen (NORCO) 5-325 mg tablet Take 1-2 tablets by mouth every 6 (six) hours as needed  hydrocortisone 2.5 % cream  insulin glargine-yfgn (SEMGLEE,INSULIN GLARG-YFGN,PEN) 100 unit/mL (3 mL) InPn Inject 40 Units subcutaneously once daily 45 mL 3  insulin LISPRO (HUMALOG KWIKPEN INSULIN) pen injector (concentration 100 units/mL) INJECT UP TO 50 UNITS UNDER THE SKIN DAILY AS DIRECTED 45 mL 3  isosorbide mononitrate (IMDUR) 60 MG ER tablet  lidocaine (LIDODERM) 5 % patch  losartan (COZAAR) 25 MG tablet Take 1 tablet by mouth once daily  metOLazone (ZAROXOLYN) 2.5 MG tablet Take 2.5 mg by mouth once daily as needed  montelukast (SINGULAIR) 10  mg tablet Take 10 mg by mouth nightly  multivitamin tablet Take 1 tablet by mouth once daily.  mycophenolate (MYFORTIC) 180 MG DR  tablet Take 360 mg by mouth 2 (two) times daily  OMEPRAZOLE ORAL Take 20 mg by mouth once daily.  pen needle, diabetic 32 gauge x 5/16" needle Use 4 (four) times daily 400 each 3  potassium chloride (MICRO-K) 10 MEQ ER capsule Take 80 mEq by mouth 2 (two) times daily  predniSONE (DELTASONE) 20 MG tablet Take by mouth  rosuvastatin (CRESTOR) 10 MG tablet Take 10 mg by mouth once daily  spironolactone (ALDACTONE) 25 MG tablet Take 25 mg by mouth once daily  sulfamethoxazole-trimethoprim (BACTRIM SS) 400-80 mg tablet 1 tablet 3 (three) times a week  tacrolimus (PROGRAF) 1 MG capsule Take 2 mg by mouth 2 (two) times daily  tamsulosin (FLOMAX) 0.4 mg capsule Take 0.4 mg by mouth once daily  testosterone (ANDROGEL) 20.25 mg/1.25 gram (1.62 %) gel in metered dose pump Place onto the skin  TORsemide (DEMADEX) 20 MG tablet Patient Takes when not taking FUROsemide  zinc sulfate (ZINCATE) 50 mg zinc (220 mg) capsule Take 1 capsule by mouth 2 (two) times daily  amLODIPine (NORVASC) 5 MG tablet Take 5 mg by mouth once daily (Patient not taking: Reported on 11/26/2022)  beclomethasone (QVAR) 80 mcg/actuation inhaler Inhale 2 inhalations into the lungs 2 (two) times daily (Patient not taking: Reported on 11/26/2022)  blood glucose diagnostic (CONTOUR NEXT TEST STRIPS) test strip Use 3 (three) times daily Use as instructed. (Patient not taking: Reported on 08/26/2022) 300 each 11  cyanocobalamin (VITAMIN B12) 100 MCG tablet Take 1,000 mcg by mouth (Patient not taking: Reported on 11/26/2022)  ferrous sulfate 325 (65 FE) MG tablet Take 325 mg by mouth 2 (two) times daily with meals (Patient not taking: Reported on 11/26/2022)  FUROsemide (LASIX) 80 MG tablet Take 80 mg by mouth once daily as needed Patient takes when not taking TORsemide (Patient not taking: Reported on 11/26/2022)  lisinopriL (ZESTRIL) 40 MG tablet Take 40 mg by mouth once daily (Patient not taking: Reported on 11/26/2022)  mometasone (NASONEX) 50  mcg/actuation nasal spray Place 2 sprays into both nostrils once daily (Patient not taking: Reported on 11/26/2022)  traMADoL (ULTRAM) 50 mg tablet Take 50 mg by mouth every 8 (eight) hours as needed (Patient not taking: Reported on 11/26/2022)   No current facility-administered medications on file prior to visit.   Family History  Problem Relation Age of Onset  Valvular heart disease Mother  Coronary Artery Disease (Blocked arteries around heart) Father  ESRD-Transplant Father  Heart disease Father  Kidney failure Father  Kidney disease Father  Arthritis Sister  Diabetes Maternal Uncle  Liver cancer Paternal Uncle  Diabetes Maternal Grandmother  Breast cancer Maternal Grandmother  Diabetes Maternal Grandfather  Liver cancer Paternal Grandmother    Social History   Tobacco Use  Smoking Status Former  Types: Cigars  Quit date: 09/02/1994  Years since quitting: 28.2  Passive exposure: Past  Smokeless Tobacco Never    Social History   Socioeconomic History  Marital status: Married  Number of children: 2  Occupational History  Occupation: Not employed right now  Tobacco Use  Smoking status: Former  Types: Cigars  Quit date: 09/02/1994  Years since quitting: 28.2  Passive exposure: Past  Smokeless tobacco: Never  Vaping Use  Vaping status: Never Used  Substance and Sexual Activity  Alcohol use: Yes  Comment: very little  Drug use: Never  Sexual  activity: Defer   Social Determinants of Health   Food Insecurity: No Food Insecurity (08/05/2022)  Received from [facility]  Hunger Vital Sign  Worried About Running Out of Food in the Last Year: Never true  Ran Out of Food in the Last Year: Never true  Transportation Needs: No Transportation Needs (08/05/2022)  Received from [facility] - Transportation  Lack of Transportation (Medical): No  Lack of Transportation (Non-Medical): No   ############################################################  Review of  Systems: A complete review of systems (ROS) was obtained from the patient.  We have reviewed this information and discussed as appropriate with the patient.  See HPI as well for other pertinent ROS.  Constitutional: No fevers, chills, sweats. Weight stable Eyes: No vision changes, No discharge HENT: No sore throats, nasal drainage Lymph: No neck swelling, No bruising easily Pulmonary: No cough, productive sputum CV: No orthopnea, PND . No exertional chest/neck/shoulder/arm pain. Patient can walk 1 mile without difficulty.   GI: per HPI No personal nor family history of GI/colon cancer, inflammatory bowel disease, irritable bowel syndrome, allergy such as Celiac Sprue, dietary/dairy problems, colitis, ulcers nor gastritis. No recent sick contacts/gastroenteritis. No travel outside the country. No changes in diet.  Renal: Kidney transplant 2012 as noted above no UTIs, No hematuria Genital: No drainage, bleeding, masses Musculoskeletal: No severe joint pain. Good ROM major joints Skin: Some chronic skin thickening and irritation on his legs. No other sores or lesions Heme/Lymph: No easy bleeding. No swollen lymph nodes Neuro: No active seizures. No facial droop Psych: No hallucinations. No agitation  OBJECTIVE   Vitals:  11/26/22 0901  BP: 136/82  Pulse: 65  Temp: 36.7 C (98 F)  SpO2: 97%  Weight: (!) 117.5 kg (259 lb)  Height: 177.8 cm (5\' 10" )  PainSc: 0-No pain   Body mass index is 37.16 kg/m.  PHYSICAL EXAM:  Constitutional: Not cachectic. Hygeine adequate. Vitals signs as above.  Eyes: Wears glasses - vision corrected,Pupils reactive, normal extraocular movements. Sclera nonicteric Neuro: CN II-XII intact. No major focal sensory defects. No major motor deficits. Lymph: No head/neck/groin lymphadenopathy Psych: No severe agitation. No severe anxiety. Judgment & insight Adequate, Oriented x4, HENT: Normocephalic, Mucus membranes moist. No thrush. Hearing: adequate Neck:  Supple, No tracheal deviation. No obvious thyromegaly Chest: No pain to chest wall compression. Good respiratory excursion. No audible wheezing CV: Pulses intact. irregular. No major extremity edema Ext: No obvious deformity or contracture. Edema: Present at: Bilateral lower extremities with some skin thickening . No cyanosis Skin: No major subcutaneous nodules. Warm and dry Musculoskeletal: Severe joint rigidity not present. No obvious clubbing. No digital petechiae. Mobility: no assist device moving easily without restrictions  Abdomen: Obese Soft. Nondistended. Nontender. Hernia: Not present. Diastasis recti: Large supraumbilical midline. No hepatomegaly. No splenomegaly.  Genital/Pelvic: Inguinal hernia: Not present. Inguinal lymph nodes: without lymphadenopathy nor hidradenitis.   Rectal:  Perianal skin Mild fecal soiling  Pruritis ani: Mild Pilonidal disease: Not present Condyloma / warts: Left lateral right anterior sessile warts. Most of the perianal skin clear. Pedunculated condyloma onto an anal canal associated with anal crypt polyps especially.  Anal fissure: Not present Perirectal abscess/fistula Not present External hemorrhoids Not present  Digital and anoscopic rectal exam Barely tolerated  Sphincter tone Weak squeeze  Hemorrhoidal piles grade 2 internal with some condylomata noted. Prostate: Enlarged but smooth Rectovaginal septum: N/A Rectal masses: Not present  Other significant findings: N/A  Patient examined with patient in decubitus position .  ###################################    ###################################################################  Labs, Imaging and Diagnostic Testing:  Located in 'Care Everywhere' section of Epic EMR chart  PRIOR [facility] NOTES:  Not applicable  SURGERY NOTES:  Located in 'Care Everywhere' section of Epic EMR chart  PATHOLOGY:  Located in 'Care Everywhere' section of Epic EMR chart  Assessment and Plan:   DIAGNOSES:  Diagnoses and all orders for this visit:  Anal condyloma  Immunosuppressed status (CMS/HHS-HCC)  Hx of adenomatous colonic polyps - Ambulatory referral to Gastroenterology  Chronic anticoagulation  Longstanding persistent atrial fibrillation (CMS/HHS-HCC)  Diastasis recti    ASSESSMENT/PLAN  Patient with numerous issues.  Presenting issue of perianal and anorectal canal condylomata status post fulguration 6 months ago. Rectal region not assessed. I think the patient would benefit from anorectal examination under anesthesia with laser ablation/excision of condylomata and most likely a more aggressive survival pathway with screening given his immunosuppressed state for renal transplant.   Perhaps after a few years can go back to every 3 year colonoscopy for follow-up. Discussed with Drs. Maisie Fus and [place] and all 3 of [name] agree.   The anatomy & physiology of the anorectal region was discussed. The pathophysiology of anorectal warts and differential diagnosis was discussed. Natural history risks without surgery was discussed such as further growth and cancer. I stressed the importance of office follow-up to catch early recurrence & minimize/halt progression of disease. Interventions such as cauterization by topical agents were discussed.  The patient's symptoms are not adequately controlled by non-operative treatments. I feel the risks & problems of no surgery outweigh the operative risks; therefore, I recommended surgery to treat the anal warts by removal, ablation and/or cauterization.  Risks such as bleeding, infection, need for further treatment, Risks of bleeding, infection, injury to other organs, need for repair of tissues / organs, reoperation, heart attack, death, and other risks were discussed. I noted a good likelihood this will help address the problem. Goals of post-operative recovery were discussed as well. Possibility that this will not correct all symptoms was  explained. Post-operative pain, bleeding, constipation, and other problems after surgery were discussed. We will work to minimize complications. Educational handouts further explaining the pathology, treatment options, and bowel regimen were given as well. Questions were answered. The patient expresses understanding & wishes to proceed with surgery.  Cleared by cardiology.  Holding Eliquis.  Asked him to reach out to Dr. Shirlee Latch. He tolerated the last surgery so hopefully not an issue.  Patient does have a history of adenomatous polyps followed by [facility] gastroenterology. Last scope in 2021 with an adenomatous polyp removed. Patient is due for a 3-year follow-up. Dr. Christella Hartigan not available to do that anymore. Recommend the patient reach back out to his gastrologist and we will try and send a referral as well.  Concern by transplant team that he had abdominal bulging. This is diastases and not a hernia.  FOLLOWUP: Return for WE RECOMMEND SURGERY - See instructions.  I have reviewed this patient's available data, including medical history, doctor notes, radiology & other studies, events of note, test results, physical exam, etc as part of my evaluation.  A significant portion of that time was spent in counseling in discussion of management, need for further testing, need for other medical or surgical input, etc.  Care was provided by me.  Based on my assessment, this care required HIGH - Level 5 level of medical decision making. 11/26/2022  The plan was discussed in detail with the patient today, who expressed understanding & appreciation. The patient has my  contact information, and understands to call me with any additional questions or concerns. I & my group would be happy to see the patient back sooner if the need arises.  Of note, portions of this report may have been transcribed using voice recognition software. Every effort was made to ensure accuracy; however, inadvertent computerized  transcription errors may be present. Any transcriptional errors that result from this process are unintentional.  ########################################################  Ardeth Sportsman, MD, FACS, MASCRS Esophageal, Gastrointestinal & Colorectal Surgery Robotic and Minimally Invasive Surgery  [facility]  [address] [place], [place] [address] [phone] Fax [phone] Main   01/08/2023

## 2023-01-08 NOTE — Anesthesia Procedure Notes (Signed)
Procedure Name: Intubation Date/Time: 01/08/2023 8:58 AM  Performed by: Sampson Goon, CRNAPre-anesthesia Checklist: Patient identified, Emergency Drugs available, Suction available and Patient being monitored Patient Re-evaluated:Patient Re-evaluated prior to induction Oxygen Delivery Method: Circle System Utilized Preoxygenation: Pre-oxygenation with 100% oxygen Induction Type: IV induction Ventilation: Mask ventilation without difficulty and Oral airway inserted - appropriate to patient size Laryngoscope Size: Mac and 4 Grade View: Grade III Tube type: Oral Tube size: 7.5 mm Number of attempts: 1 Airway Equipment and Method: Stylet and Oral airway Placement Confirmation: ETT inserted through vocal cords under direct vision, positive ETCO2 and breath sounds checked- equal and bilateral Tube secured with: Tape Dental Injury: Teeth and Oropharynx as per pre-operative assessment

## 2023-01-08 NOTE — Discharge Instructions (Addendum)
##############################################  ANORECTAL SURGERY:  POST OPERATIVE INSTRUCTIONS  ######################################################################  EAT Start with a pureed / full liquid diet After 24 hours, gradually transition to a high fiber diet.    CONTROL PAIN Control pain so you can tolerate bowel movements,  walk, sleep, tolerate sneezing/coughing, and go up/down stairs.   HAVE A BOWEL MOVEMENT DAILY Keep your bowels regular to avoid problems.   Taking a fiber supplement 2-4 doses every day to keep bowels soft.   HAVE A BM BY THE SECOND POSTOPERATIVE DAY Use an antidairrheal to slow down diarrhea.   Call if not better after 2 tries  WALK Walk an hour a day.  Control your pain to do that.   CALL IF YOU HAVE PROBLEMS/CONCERNS Call if you are still struggling despite following these instructions. Call if you have concerns not answered by these instructions  ######################################################################    Take your usually prescribed home medications unless otherwise directed.  It is okay to restart your blood thinner medication the morning after surgery  DIET: Follow a light bland diet & liquids the first 24 hours after arrival home, such as soup, liquids, starches, etc.  Be sure to drink plenty of fluids.  Quickly advance to a usual solid diet within a few days.  Avoid fast food or heavy meals as your are more likely to get nauseated or have irregular bowels.  A low-fat, high-fiber diet for the rest of your life is ideal.  Control pain to avoid problems with urinating or defecating:  Pain is best controlled by a usual combination of many methods TOGETHER: Warm baths/soaks or Ice packs Over the counter pain medication Prescription pain medications Topical creams  A.  Warm water baths or ice packs (30-60 minutes up to 8 times a day, especially after bowel meovements) will help.  Using ice for the first few days can help  decrease swelling and bruising,  Use heat such as warm towels, sitz baths, warm baths, warm showers, etc to help relax tight/sore spots and speed recovery.   Some people prefer to use ice alone, heat alone, alternating between ice & heat.  Experiment to what works for you.    It is helpful to take an over-the-counter pain medication continuously for the first few weeks.  This helps people need to use less narcotics Choose one of the following that works best for you: Naproxen (Aleve, etc)  Two 220mg  tabs twice a day Ibuprofen (Advil, etc) Three 200mg  tabs four times a day (every meal & bedtime) Acetaminophen (Tylenol, etc) 500-650mg  four times a day (every meal & bedtime)  A  prescription for pain medication (such as oxycodone, hydrocodone, etc) should be given to you upon discharge.  Take your pain medication as prescribed.  If you are having problems/concerns with the prescription medicine (does not control pain, nausea, vomiting, rash, itching, etc), please call us [phone] to see if we need to switch you to a different pain medicine that will work better for you and/or control your side effect better. Most people will need 1-2 refills to get through the first couple of weeks when pain is the most intense.  If you need a refill on your pain medication, please contact your pharmacy.  They will contact our office to request authorization. Prescriptions will not be filled after 5 pm or on week-ends.  If can take up to 48 hours for it to be filled & ready so avoid waiting until you are down to thel ast pill.  A  numbing topical cream (Dibucaine) may be given to you.  Many people find relief with topical creams.  Some people find it burns too much.  Experiment.  If it helps, use it.  If it burns, stop using it.       Have a bowel movement every day  Until you have a bowel movement after surgery, he will struggle with urinating and controlling her pain.  Take a fiber supplement (such as  Metamucil, Citrucel, FiberCon, MiraLax, etc) 2-4 doses a day to help prevent constipation from occurring.     If you have not had a bowel movement the second postoperative day:  -switch to drinking liquids only -take MiraLAX double dose every 2 hours x 3 or until you have a BM -If that does not work x 3 doses, increase to 4 doses every 2 hours (Like a small bowel prep) until you have a bowel movement. -Avoid enemas or suppositories (can harm sutures/repair) -Once you start having bowel movements, adjust your fiber supplement or Miralax dosing back down until you are having 1-2 soft well formed BMs a day.  (Start at 2 doses a day & adjust up or down to avoid diarrhea or recurrent constipation)   Watch out for diarrhea.  If you have many loose bowel movements, simplify your diet to bland foods & liquids for a few days.  Stop any stool softeners and decrease your fiber supplement.  Switching to mild anti-diarrheal medications (Kayopectate, Pepto Bismol) can help.  Can try an imodium/loperamide dose.  If this worsens or does not improve, please call us.  Wound Care   a. You have some fluffed cotton on top of the anus to help catch drainage and bleeding.  THERE IS NO PACKING INSIDE THE RECTUM -Let the cotton fall off with the first bowel movement or shower.  It is okay to reinforce or replace as needed.  Bleeding is common at first and gradually slows down after a few weeks   b. Place soft cotton balls on the anus/wounds and use an absorbent pad in your underwear as needed to catch any drainage and help keep the area.  Try to use cotton balls or pads (regular gauze or toilet paper will stick and pull, causing pain.  Cotton will come off more easily).  OK to try dry powders such as cornstarch or baby powders   c. Keep the area clean and dry.  Bathe / shower every day.  Keep the area clean by showering / bathing over the incision / wound.   It is okay to soak an open wound to help wash it.  Consider  using a squeeze bottle filled with warm water to gently wash the anal area.  Wet wipes or showers / gentle washing after bowel movements is often less traumatic than regular toilet paper.             d.  Use a Sitz Bath 4-8 times a day for relief  A sitz bath is a warm water bath taken in the sitting position that covers only the hips and buttocks. It may be used for either healing or hygiene purposes. Sitz baths are also used to relieve pain, itching, or muscle spasms.  Gently cleaned the area and the heat will help lower spasm and offer better pain control.    Fill the bathtub half full with warm water. Sit in the water and open the drain a little. Turn on the warm water to keep the tub half full. Keep  the water running constantly. Soak in the water for 15 to 20 minutes. After the sitz bath, pat the affected area dry first.   d. You will often notice bleeding, especially with bowel movements.  You may notice more than usual since you are on blood thinners.  It usually is not a major issue of bleeding and should taper off.  Most bleeding has slowed down by the end of the first week of surgery.  It can take over a month to more fully stop.  Sitting on an ice pack can help.   e. Expect some drainage.  You often will have some blood or yellow drainage with open wounds.  Sometimes she will get a little leaking of liquid stool until the incision/wounds have fully close down.  This should slow down by the end of the first week of surgery, but you will have occasional bleeding or drainage up to a few months after surgery until all incisions & wounds have closed.  Wear an absorbent pad or soft cotton gauze in your underwear until the drainage stops.  ACTIVITIES as tolerated:    You may resume regular (light) daily activities beginning the next day--such as daily self-care, walking, climbing stairs--gradually increasing activities as tolerated.  If you can walk 30 minutes without difficulty, it is safe to  try more intense activity such as jogging, treadmill, bicycling, low-impact aerobics, swimming, etc. Save the most intensive and strenuous activity for last such as sit-ups, heavy lifting, contact sports, etc  Refrain from any heavy lifting or straining until you are off narcotics for pain control.   DO NOT PUSH THROUGH PAIN.  Let pain be your guide: If it hurts to do something, don't do it.  Pain is your body warning you to avoid that activity for another week until the pain goes down. You may drive when you are no longer taking prescription pain medication, you can comfortably sit for long periods of time, and you can safely maneuver your car and apply brakes. You may have sexual intercourse when it is comfortable.   6.  FOLLOW UP in our office Please call [facility] at [phone] to set up an appointment to see your surgeon in the office for a follow-up appointment approximately 3 weeks after your surgery. If tissue is sent for pathology, it usually takes a week for pathology results to come back.  We will make an effort to call you with results as soon as we get them.  If you have not heard back in a week and wish to know the results before your postop visit, please call our office and we will see if we can give feedback on the results Make sure that you call for this appointment the day you arrive home to ensure a convenient appointment time.  7. IF YOU HAVE DISABILITY OR FAMILY LEAVE FORMS, BRING THEM TO THE OFFICE FOR PROCESSING.  DO NOT GIVE THEM TO YOUR DOCTOR.        WHEN TO CALL us [phone]: Poor pain control Reactions / problems with new medications (rash/itching, nausea, etc)  Fever over 101.5 F (38.5 C) Inability to urinate Nausea and/or vomiting Worsening swelling or bruising Continued bleeding from incision. Increased pain, redness, or drainage from the incision  The clinic staff is available to answer your questions during regular business hours (8:30am-5pm).  Please  don't hesitate to call and ask to speak to one of our nurses for clinical concerns.   A Careers adviser from [facility]  Surgery is always on call at the hospitals   If you have a medical emergency, go to the nearest emergency room or call 911.    [facility] [address], [place], [place]  [address] ? MAIN: [phone] ? TOLL FREE: [phone] ? FAX [phone] [email or web address]  #####################################################    Recommended surgeon followup physical exam schedule for condyloma/warts: -every 6 months until negative exam x2, then -every Year until negative exam x2, then -as needed thereafter   Genital Warts Genital warts are a sexually transmitted infection. They may appear as small bumps on the tissues of the genital area. CAUSES  Genital warts are caused by a virus called human papillomavirus (HPV). HPV is the most common sexually transmitted disease (STD) and infection of the sex organs. This infection is spread by having unprotected sex with an infected person. It can be spread by vaginal, anal, and oral sex. Many people do not know they are infected. They may be infected for years without problems. However, even if they do not have problems, they can unknowingly pass the infection to their sexual partners. SYMPTOMS  Itching and irritation in the genital area.Anal wart  Warts that bleed. Painful sexual intercourse. DIAGNOSIS  Warts are usually recognized with the naked eye on the vagina, vulva, perineum, anus, and rectum. Certain tests can also diagnose genital warts, such as: A Pap test. A tissue sample (biopsy) exam. Colposcopy. A magnifying tool is used to examine the vagina and cervix. The HPV cells will change color when certain solutions are used. TREATMENT  Warts can be removed by: Applying certain chemicals, such as cantharidin or podophyllin. Liquid nitrogen freezing  (cryotherapy). Immunotherapy with candida or trichophyton injections. Laser treatment. Burning with an electrified probe (electrocautery). Interferon injections. Surgery. PREVENTION  HPV vaccination can help prevent HPV infections that cause genital warts and that cause cancer of the cervix. It is recommended that the vaccination be given to people between the ages 55 to 1 years old. The vaccine might not work as well or might not work at all if you already have HPV. It should not be given to pregnant women. HOME CARE INSTRUCTIONS  It is important to follow your caregiver's instructions. The warts will not go away without treatment. Repeat treatments are often needed to get rid of warts. Even after it appears that the warts are gone, the normal tissue underneath often remains infected. Do not try to treat genital warts with medicine used to treat hand warts. This type of medicine is strong and can burn the skin in the genital area, causing more damage. Tell your past and current sexual partner(s) that you have genital warts. They may be infected also and need treatment. Avoid sexual contact while being treated. Do not touch or scratch the warts. The infection may spread to other parts of your body. Women with genital warts should have a cervical cancer check (Pap test) at least once a year. This type of cancer is slow-growing and can be cured if found early. Chances of developing cervical cancer are increased with HPV. Inform your obstetrician about your warts in the event of pregnancy. This virus can be passed to the baby's respiratory tract. Discuss this with your caregiver. Use a condom during sexual intercourse. Following treatment, the use of condoms will help prevent reinfection. Ask your caregiver about using over-the-counter anti-itch creams. SEEK MEDICAL CARE IF:  Your treated skin becomes red, swollen, or painful. You have a fever. You feel generally  ill. You feel little lumps in and  around your genital area. You are bleeding or have painful sexual intercourse. MAKE SURE YOU:  Understand these instructions. Will watch your condition. Will get help right away if you are not doing well or get worse.

## 2023-01-08 NOTE — Op Note (Signed)
01/08/2023  10:02 AM  PATIENT:  Carl Woodward Ku.  62 y.o. male  Patient Care Team: [name], MD as PCP - General (Pediatrics) Laurey Morale, MD as PCP - Advanced Heart Failure (Cardiology) Iran Ouch, MD as PCP - Cardiology (Cardiology) Delma Freeze, FNP as Nurse Practitioner (Family Medicine) Laurey Morale, MD as Consulting Physician (Cardiology) Sung Amabile, DO as Consulting Physician (General Surgery) Karie Soda, MD as Consulting Physician (Colon and Rectal Surgery) Sparks, Derry Skill, PA-C as Physician Assistant (Transplant)  PRE-OPERATIVE DIAGNOSIS:  ANAL CONDYLOMATA  POST-OPERATIVE DIAGNOSIS:  ANAL CONDYLOMATA  PROCEDURE: ANAL WART ABLATION - CO2 LASER & CAUTERIZATION and EXCISION OF ANAL CONDYLOMATA  SURGEON:  Ardeth Sportsman, MD  ASSISTANT:  (n/a)   ANESTHESIA:   Regional perianal and anorectal canal  nerve block for perioperative & postoperative pain control provided with liposomal bupivacaine (Experel) mixed with 0.5% bupivacaine X  Estimated Blood Loss (EBL):   Total I/O In: 500 [I.V.:500] Out: 25 [Blood:25].   (See anesthesia record)  Delay start of Pharmacological VTE agent (>24hrs) due to concerns of significant anemia, surgical blood loss, or risk of bleeding?:  no  DRAINS: (None)  SPECIMEN:  Anal wart / condyloma X 2  -left lateral perianal -right anterior anal canal.  DISPOSITION OF SPECIMEN:  Pathology  COUNTS:  Sponge, needle, & instrument counts CORRECT  PLAN OF CARE: Discharge to home after PACU  PATIENT DISPOSITION:  PACU - hemodynamically stable.  INDICATION: Patient with perianal condyloma.  Chronically immunosuppressed given history of renal transplant.  Numerous medical problems.  Burden felt too much to treat in office.  Recommendation made for examination under anesthesia with ablation and possible removal of condyloma.  Felt not to be safe at outpatient center set on an inpatient hospital.   Cleared by cardiology.  Anticoagulation held.  The anatomy & physiology of the anorectal region was discussed.  The pathophysiology of anorectal warts and differential diagnosis was discussed.  Natural history risks without surgery was discussed such as further growth and cancer.   I stressed the importance of office follow-up to catch early recurrence & minimize/halt progression of disease.  Interventions such as cauterization by topical agents were discussed.  The patient's symptoms are not adequately controlled by non-operative treatments.  I feel the risks & problems of no surgery outweigh the operative risks; therefore, I recommended surgery to treat the anal warts by removal, ablation and/or cauterization.  Risks such as bleeding, infection, need for further treatment, heart attack, death, and other risks were discussed.   I noted a good likelihood this will help address the problem. Goals of post-operative recovery were discussed as well.  Possibility that this will not correct all symptoms was explained.  Post-operative pain, bleeding, constipation, and other problems after surgery were discussed.  We will work to minimize complications.   Educational handouts further explaining the pathology, treatment options, and bowel regimen were given as well.  Questions were answered.  The patient expresses understanding & wishes to proceed with surgery.  OR FINDINGS: Numerous perianal and especially anal canal verrucous pedunculated masses consistent with anal condylomata.  2 most suspicious areas removed in left lateral perianal region and right anterior anal canal.  Remaining condyloma ablated with CO2 laser and occasional touch point needle tip cautery for hemostasis.  DESCRIPTION:   Informed consent was confirmed. Patient underwent general anesthesia without difficulty. Patient was placed into  prone/jacknife positioning.  The perianal region was prepped and draped in sterile  fashion. Surgical  time-out confirmed our plan.  I did digital rectal examination and then transitioned over to anoscopy to get a sense of the anatomy.  Used acetic acid staining to highlight areas with some concern of dysplasia.  Findings noted above.  I excised the 2 largest and most suspicious lesions in the left lateral perianal region about 2.5 cm from the anal verge.  Did not other excision in the right anterior anal verge.  I proceeded to ablate the condyloma using the CO2 laser.  Set to 7 W initially and had to adjust to 15 W for most of the case.  I worked peripherally and then into anal canal.  Anal canal had the highest concentration, covering the grade 2/3 hemorrhoids.  I decided to hold off on any hemorrhoidectomy since the condylomata were mostly sessile in the anal canal/hemorrhoid piles without any major concern of fibrotic ulceration.   Ablated superficial layer in these regions with laser and occasional cautery tip needlepoint.  Work to ensure hemostasis.  Did reinspection 360 degrees x 4 to clear the areas and assure hemostasis.  No other major abnormalities noted.  I repeated anoscopy and examination.  Hemostasis was good.  Patient being extubated to go to the recovery room.  I had discussed postop care in detail with the patient in the preop holding area.  Instructions for post-operative recovery had been given in the office and moral be given at discharge.  Prescription for pain medicine written.  I discussed operative findings, updated the patient's status, discussed probable steps to recovery, and gave postoperative recommendations to the patient's spouse, [name] .  Recommendations were made.  Questions were answered.  She expressed understanding & appreciation.   Ardeth Sportsman, M.D., F.A.C.S. Gastrointestinal and Minimally Invasive Surgery [facility] [address], [address] [place], [place] [address] [phone] Main / Paging

## 2023-01-08 NOTE — Transfer of Care (Signed)
Immediate Anesthesia Transfer of Care Note  Patient: [name].  Procedure(s) Performed: LASER REMOVAL ABLATION OF CONDYLOMATA ANORECTAL EXAM UNDER ANESTHESIA  Patient Location: PACU  Anesthesia Type:General  Level of Consciousness: awake  Airway & Oxygen Therapy: Patient Spontanous Breathing and Patient connected to face mask oxygen  Post-op Assessment: Report given to RN and Post -op Vital signs reviewed and stable  Post vital signs: Reviewed and stable  Last Vitals:  Vitals Value Taken Time  BP 159/80 01/08/23 1000  Temp    Pulse 65 01/08/23 1003  Resp 16 01/08/23 1003  SpO2 98 % 01/08/23 1003  Vitals shown include unfiled device data.  Last Pain:  Vitals:   01/08/23 0804  TempSrc:   PainSc: 0-No pain         Complications: No notable events documented.

## 2023-01-09 ENCOUNTER — Other Ambulatory Visit: Payer: Self-pay

## 2023-01-09 ENCOUNTER — Encounter (HOSPITAL_COMMUNITY): Payer: Self-pay | Admitting: Surgery

## 2023-01-09 ENCOUNTER — Other Ambulatory Visit: Payer: Self-pay | Admitting: Primary Care

## 2023-01-09 ENCOUNTER — Other Ambulatory Visit: Payer: Self-pay | Admitting: Physician Assistant

## 2023-01-09 ENCOUNTER — Other Ambulatory Visit: Payer: Self-pay | Admitting: Internal Medicine

## 2023-01-09 DIAGNOSIS — N411 Chronic prostatitis: Secondary | ICD-10-CM

## 2023-01-09 LAB — SURGICAL PATHOLOGY

## 2023-01-09 MED ORDER — LIDOCAINE 5 % EX OINT
1.0000 | TOPICAL_OINTMENT | Freq: Three times a day (TID) | CUTANEOUS | 0 refills | Status: DC | PRN
  Filled 2023-01-09: qty 50, 17d supply, fill #0

## 2023-01-09 MED ORDER — LIDOCAINE HCL URETHRAL/MUCOSAL 2 % EX GEL
CUTANEOUS | 0 refills | Status: DC | PRN
  Filled 2023-01-09: qty 30, 30d supply, fill #0

## 2023-01-09 MED FILL — Omeprazole Cap Delayed Release 20 MG: ORAL | 30 days supply | Qty: 30 | Fill #0 | Status: CN

## 2023-01-10 ENCOUNTER — Other Ambulatory Visit: Payer: Self-pay | Admitting: Internal Medicine

## 2023-01-10 ENCOUNTER — Other Ambulatory Visit: Payer: Self-pay

## 2023-01-10 MED ORDER — GABAPENTIN 300 MG PO CAPS: 300.0000 mg | ORAL_CAPSULE | Freq: Two times a day (BID) | ORAL | 0 refills | Status: DC

## 2023-01-10 MED ORDER — GABAPENTIN 300 MG PO CAPS
300.0000 mg | ORAL_CAPSULE | Freq: Two times a day (BID) | ORAL | 0 refills | Status: DC
  Filled 2023-01-10 – 2023-03-12 (×3): qty 180, 90d supply, fill #0

## 2023-01-10 MED FILL — Montelukast Sodium Tab 10 MG (Base Equiv): ORAL | 30 days supply | Qty: 30 | Fill #0 | Status: AC

## 2023-01-11 ENCOUNTER — Other Ambulatory Visit: Payer: Self-pay

## 2023-01-11 MED FILL — Montelukast Sodium Tab 10 MG (Base Equiv): ORAL | 30 days supply | Qty: 30 | Fill #0 | Status: CN

## 2023-01-12 ENCOUNTER — Other Ambulatory Visit: Payer: Self-pay

## 2023-01-13 ENCOUNTER — Encounter (INDEPENDENT_AMBULATORY_CARE_PROVIDER_SITE_OTHER): Payer: Self-pay

## 2023-01-13 ENCOUNTER — Ambulatory Visit (INDEPENDENT_AMBULATORY_CARE_PROVIDER_SITE_OTHER): Payer: Medicare Other | Admitting: Nurse Practitioner

## 2023-01-14 ENCOUNTER — Ambulatory Visit: Payer: Commercial Managed Care - PPO | Admitting: Physician Assistant

## 2023-01-14 ENCOUNTER — Other Ambulatory Visit: Payer: Self-pay

## 2023-01-14 ENCOUNTER — Telehealth: Payer: Self-pay | Admitting: Internal Medicine

## 2023-01-14 ENCOUNTER — Encounter: Payer: Self-pay | Admitting: Internal Medicine

## 2023-01-14 DIAGNOSIS — I890 Lymphedema, not elsewhere classified: Secondary | ICD-10-CM | POA: Diagnosis not present

## 2023-01-14 DIAGNOSIS — I10 Essential (primary) hypertension: Secondary | ICD-10-CM | POA: Diagnosis not present

## 2023-01-14 DIAGNOSIS — E11622 Type 2 diabetes mellitus with other skin ulcer: Secondary | ICD-10-CM | POA: Diagnosis not present

## 2023-01-14 DIAGNOSIS — S81809A Unspecified open wound, unspecified lower leg, initial encounter: Secondary | ICD-10-CM | POA: Diagnosis not present

## 2023-01-14 DIAGNOSIS — I5042 Chronic combined systolic (congestive) and diastolic (congestive) heart failure: Secondary | ICD-10-CM | POA: Diagnosis not present

## 2023-01-14 DIAGNOSIS — I480 Paroxysmal atrial fibrillation: Secondary | ICD-10-CM | POA: Diagnosis not present

## 2023-01-14 DIAGNOSIS — L97812 Non-pressure chronic ulcer of other part of right lower leg with fat layer exposed: Secondary | ICD-10-CM | POA: Diagnosis not present

## 2023-01-14 DIAGNOSIS — L97822 Non-pressure chronic ulcer of other part of left lower leg with fat layer exposed: Secondary | ICD-10-CM | POA: Diagnosis not present

## 2023-01-14 DIAGNOSIS — S81812A Laceration without foreign body, left lower leg, initial encounter: Secondary | ICD-10-CM | POA: Diagnosis not present

## 2023-01-14 DIAGNOSIS — I87333 Chronic venous hypertension (idiopathic) with ulcer and inflammation of bilateral lower extremity: Secondary | ICD-10-CM | POA: Diagnosis not present

## 2023-01-14 MED ORDER — HYDROCODONE-ACETAMINOPHEN 5-325 MG PO TABS
1.0000 | ORAL_TABLET | Freq: Three times a day (TID) | ORAL | 0 refills | Status: DC | PRN
  Filled 2023-01-14: qty 30, 5d supply, fill #0

## 2023-01-14 NOTE — Telephone Encounter (Signed)
Left message on VM that forms are up front ready for pickup. There is no charge for them.

## 2023-01-14 NOTE — Telephone Encounter (Signed)
Patient called in and stated that he is needing a copy of his FMLA. He would a call when this is ready. Thank you!

## 2023-01-14 NOTE — Telephone Encounter (Signed)
Patient has picked up this copy

## 2023-01-15 ENCOUNTER — Other Ambulatory Visit (HOSPITAL_COMMUNITY): Payer: Self-pay | Admitting: Internal Medicine

## 2023-01-15 ENCOUNTER — Other Ambulatory Visit: Payer: Self-pay | Admitting: Physician Assistant

## 2023-01-15 ENCOUNTER — Other Ambulatory Visit (HOSPITAL_COMMUNITY): Payer: Self-pay | Admitting: Cardiology

## 2023-01-15 ENCOUNTER — Other Ambulatory Visit: Payer: Self-pay

## 2023-01-15 ENCOUNTER — Telehealth: Payer: Self-pay | Admitting: Internal Medicine

## 2023-01-15 DIAGNOSIS — N411 Chronic prostatitis: Secondary | ICD-10-CM

## 2023-01-15 MED FILL — Montelukast Sodium Tab 10 MG (Base Equiv): ORAL | 30 days supply | Qty: 30 | Fill #0 | Status: CN

## 2023-01-15 MED FILL — Omeprazole Cap Delayed Release 20 MG: ORAL | 30 days supply | Qty: 30 | Fill #0 | Status: AC

## 2023-01-15 NOTE — Telephone Encounter (Signed)
Pt called in and stated he needs the current FMLA dates 12/24/2022,12/24/2023 pt would like a call back if possible pt also stated the last FMLA was the wrong one pt can be reached at [phone]. Pt also would like to pick form up when ready.

## 2023-01-16 ENCOUNTER — Other Ambulatory Visit: Payer: Self-pay | Admitting: Physician Assistant

## 2023-01-16 ENCOUNTER — Other Ambulatory Visit: Payer: Self-pay

## 2023-01-16 ENCOUNTER — Encounter: Payer: Self-pay | Admitting: Oncology

## 2023-01-16 DIAGNOSIS — N411 Chronic prostatitis: Secondary | ICD-10-CM

## 2023-01-16 DIAGNOSIS — E1165 Type 2 diabetes mellitus with hyperglycemia: Secondary | ICD-10-CM | POA: Diagnosis not present

## 2023-01-16 DIAGNOSIS — E113593 Type 2 diabetes mellitus with proliferative diabetic retinopathy without macular edema, bilateral: Secondary | ICD-10-CM | POA: Diagnosis not present

## 2023-01-16 DIAGNOSIS — E1122 Type 2 diabetes mellitus with diabetic chronic kidney disease: Secondary | ICD-10-CM | POA: Diagnosis not present

## 2023-01-16 DIAGNOSIS — Z940 Kidney transplant status: Secondary | ICD-10-CM | POA: Diagnosis not present

## 2023-01-16 DIAGNOSIS — E1142 Type 2 diabetes mellitus with diabetic polyneuropathy: Secondary | ICD-10-CM | POA: Diagnosis not present

## 2023-01-16 DIAGNOSIS — N184 Chronic kidney disease, stage 4 (severe): Secondary | ICD-10-CM | POA: Diagnosis not present

## 2023-01-16 DIAGNOSIS — Z7952 Long term (current) use of systemic steroids: Secondary | ICD-10-CM | POA: Diagnosis not present

## 2023-01-16 DIAGNOSIS — I10 Essential (primary) hypertension: Secondary | ICD-10-CM | POA: Diagnosis not present

## 2023-01-16 DIAGNOSIS — Z794 Long term (current) use of insulin: Secondary | ICD-10-CM | POA: Diagnosis not present

## 2023-01-16 DIAGNOSIS — E1159 Type 2 diabetes mellitus with other circulatory complications: Secondary | ICD-10-CM | POA: Diagnosis not present

## 2023-01-16 MED ORDER — MOUNJARO 2.5 MG/0.5ML ~~LOC~~ SOAJ
2.5000 mg | SUBCUTANEOUS | 1 refills | Status: DC
  Filled 2023-01-16: qty 2, 28d supply, fill #0

## 2023-01-16 MED ORDER — METOLAZONE 2.5 MG PO TABS
2.5000 mg | ORAL_TABLET | ORAL | 2 refills | Status: DC
  Filled 2023-01-16: qty 8, 28d supply, fill #0

## 2023-01-16 MED ORDER — TORSEMIDE 100 MG PO TABS
100.0000 mg | ORAL_TABLET | Freq: Two times a day (BID) | ORAL | 5 refills | Status: DC
  Filled 2023-01-16: qty 60, 30d supply, fill #0

## 2023-01-16 MED FILL — Tamsulosin HCl Cap 0.4 MG: ORAL | 30 days supply | Qty: 30 | Fill #0 | Status: AC

## 2023-01-16 NOTE — Telephone Encounter (Signed)
See MyChart message. Paperwork taken care of.

## 2023-01-17 ENCOUNTER — Other Ambulatory Visit: Payer: Self-pay

## 2023-01-21 ENCOUNTER — Encounter: Payer: Commercial Managed Care - PPO | Admitting: Cardiology

## 2023-01-21 ENCOUNTER — Other Ambulatory Visit: Payer: Self-pay

## 2023-01-22 ENCOUNTER — Ambulatory Visit: Payer: Commercial Managed Care - PPO | Attending: Cardiology | Admitting: Cardiology

## 2023-01-22 ENCOUNTER — Encounter: Payer: Self-pay | Admitting: Cardiology

## 2023-01-22 VITALS — BP 143/81 | HR 64 | Wt 253.0 lb

## 2023-01-22 DIAGNOSIS — I5042 Chronic combined systolic (congestive) and diastolic (congestive) heart failure: Secondary | ICD-10-CM

## 2023-01-22 NOTE — Patient Instructions (Signed)
Discontinue IMDUR  Routine lab work today. Will notify you of abnormal results   Follow up in 3 months  Your provider requests you have an echocardiogram  Do the following things EVERYDAY: Weigh yourself in the morning before breakfast. Write it down and keep it in a log. Take your medicines as prescribed Eat low salt foods--Limit salt (sodium) to 2000 mg per day.  Stay as active as you can everyday Limit all fluids for the day to less than 2 liters

## 2023-01-23 ENCOUNTER — Other Ambulatory Visit: Payer: Self-pay

## 2023-01-23 ENCOUNTER — Other Ambulatory Visit
Admission: RE | Admit: 2023-01-23 | Discharge: 2023-01-23 | Disposition: A | Discharge status: Home / Self Care | Payer: Commercial Managed Care - PPO | Attending: Cardiology | Admitting: Cardiology

## 2023-01-23 DIAGNOSIS — I5042 Chronic combined systolic (congestive) and diastolic (congestive) heart failure: Secondary | ICD-10-CM | POA: Insufficient documentation

## 2023-01-23 LAB — BASIC METABOLIC PANEL WITH GFR
Anion gap: 13 (ref 5–15)
BUN: 121 mg/dL — ABNORMAL HIGH (ref 8–23)
CO2: 26 mmol/L (ref 22–32)
Calcium: 9.1 mg/dL (ref 8.9–10.3)
Chloride: 100 mmol/L (ref 98–111)
Creatinine, Ser: 3.23 mg/dL — ABNORMAL HIGH (ref 0.61–1.24)
GFR, Estimated: 21 mL/min — ABNORMAL LOW (ref >60)
Glucose, Bld: 149 mg/dL — ABNORMAL HIGH (ref 70–99)
Potassium: 4.6 mmol/L (ref 3.5–5.1)
Sodium: 139 mmol/L (ref 135–145)

## 2023-01-23 LAB — LIPID PANEL
Cholesterol: 79 mg/dL (ref 0–200)
HDL: 26 mg/dL — ABNORMAL LOW (ref >40)
LDL Cholesterol: 7 mg/dL (ref 0–99)
Total CHOL/HDL Ratio: 3 {ratio}
Triglycerides: 232 mg/dL — ABNORMAL HIGH (ref <150)
VLDL: 46 mg/dL — ABNORMAL HIGH (ref 0–40)

## 2023-01-23 LAB — BRAIN NATRIURETIC PEPTIDE: B Natriuretic Peptide: 163.3 pg/mL — ABNORMAL HIGH (ref 0.0–100.0)

## 2023-01-23 NOTE — Progress Notes (Signed)
Advanced Heart Failure Clinic Note   Date:  01/23/2023   ID:  Carl Goudy., DOB 03-10-1961, MRN [id number]   Provider location: [address] [place] Type of Visit:  Established patient  PCP:  [name], MD  Cardiologist:  Dr. Kirke Corin Nephrology: Dr. Yetta [name]  HF Cardiologist: Dr. Shirlee Latch  HPI: Carl Ethridge Fittro. is a 62 y.o. male  with a history of CAD s/p CABG 2005, chronic atrial fibrillation on Eliquis, ESRD s/p renal transplant 2012, anc chronic diastolic CHF.  His renal function has been fairly stable, most recent creatinine 1.43.  He has been struggling with volume overload/CHF.  He has a history of ischemic cardiomyopathy with EF as low as 35-40% in the past but echo in 3/20 showed EF 60-65%.  There is evidence for RV failure with pulmonary hypertension by echo.    Admitted 11/05/18 low grade fever and shortness of breath. HS Trop negative. Covid 19 negative. Blood CX negative. Placed on antibiotics and discharged to home the next day.   Admitted 05/03/19 with A/C Diastolic HF. Diuresed with IV lasix and transitioned to torsemide 80/40 mg . Discharge weight 252 pounds.   Admitted 06/2019 with left arm pain. Left upper extremity venous Doppler revealed occluded cephalic vein at the AV fistula outflow tract in the antecubital fossa with no other thrombus identified. Vascular evaluated. He continued on Eliquis.  No plan for intervention. Also treated for acute gout flare. Discharged on prednisone taper.     Given IV lasix on 12/21 by HF Paramedicine.   Admitted to [facility] 2/22 with acute gout flare vs cellulitis. Nephrology was consulted to evaluate transplant regimen and follow along. He was treated with antibiotics and prednisone. Discharged on decreased torsemide dose of 40 mg daily. Weight 259 lbs.  Admitted 4/22 for left leg cellulitis after sustaining a dog scratch, received IV antibiotics. No changes to his diuretic therapy. Discharge weight  266 lbs.  Follow up with Dr. Kirke Corin, 1/23, he had CP and underwent Lexiscan myoview, which overall was a low risk study with evidence of prior infarct in the infero-apical area with no significant change from before. Losartan added to GDMT regimen.  Follow up 5/23, volume overloaded, REDs 40%. Advised taking additional metolazone (3 doses that week), however labs showed worsening renal function and advised to keep at 2 doses.  Admitted in 5/23 with acute on chronic CHF and AKI.  Wonda Cerise and losartan stopped. Diuresed with IV lasix. Echo showed EF 40-45%, RV mildly reduced, GIIIDD (restrictive), IVC dilated. Nephrology consulted for diuretic dosing. Discharged home, weight 266 lbs.  He had chest pain in 8/23 and was admitted at [facility], minimal HS-TnI elevation with no trend, thought to be demand ischemia from volume overload.    Echo 8/23 showing EF 40-45%, diffuse hypokinesis with dyssynchrony, mildly dilated RV with mildly decreased systolic function, IVC dilated, moderate AS with mean gradient 15, mild-moderate mitral stenosis with mean gradient 6.   Follow up 03/27/22, volume overloaded with 25 lbs weight gain. Instructed to use Furoscix x 3 days, then resume home dose of torsemide + metolazone. Clinic weight 277 lbs.  Admitted from [facility] 04/01/22 with a/c CHF. Echo showed EF 40-45%, RV moderately reduced and RVSP 49 mmHg. RHC showed elevated filling pressures, moderate primarily pulmonary venous hypertension and preserved CO. Tadalafil stopped with primarily PH group 2 and 3. He was diuresed with IV lasix and metolazone, and GDMT added back. Overall down 19 lbs. He was  discharged home, weight 255 lbs.  Patient was admitted in 3/24 with Serratia bacteremia/pyelonephritis, treated with ertapenem.  He also had campylobacter diarrhea.  In 4/24, he tripped getting off his riding lawnmower and fell, fracturing ribs. He had a pulmonary contusion with this.   Today he returns for HF follow up. He is  taking torsemide 100 mg bid with metolazone 2 days/week. Weight down 4 lbs.  He stopped hydralazine due to hypotension and orthostatic symptoms.  He has been going to the gym and walking on the treadmill, he can set it to a 15% incline for about 5 minutes.  No dyspnea walking on flat ground.  No orthopnea/PND.  No chest pain.  LIghtheadedness has resolved off hydralazine.  Using CPAP.   Labs (7/19): LDL 49 Labs (2/20): K 3.7, creatinine 1.43 Labs (4/20): K 4.3, creatinine 1.24 Labs (11/06/18): K 3.7 creatinine 1.23  Labs (05/07/19): K 3.8 Creatinine 1.5  Labs (07/18/19): K 4.3 Creatinine 1.42  Labs (5/21): K 3.3, creatinine 1.21 Labs (11/21): K 3.6, creatinine 1.4 Labs (12/21): K 3.3, creatinine 2.4, hgb 12.8 Labs (04/01/19): K 3.3 Creatinine 2.2  Labs (8/22): K 4.0, creatinine 1.6, HDL 35, LDL 36, a1c 7.5 Labs (2/23): LDL 31, HDL 36 Labs (4/23): K 3.3, creatinine 1.68 Labs (5/23): K 4.1, creatinine 2.24 Labs (8/23): K 3.5, creatinine 2.36 Labs (12/23): K 3.6, creatinine 1.99, hgb 9.3 Labs (1/24): K 4.6, creatinine 2.44 Labs (2/24): K 3.4, creatinine 2.75, LDL 25 Labs (4/24): K 4.1, creatinine 2.46 Labs (5/24): K 2.7, creatinine 2.25, IgG monoclonal protein on myeloma panel.  Labs (6/24): K 3.8, creatinine 2.58 Labs (9/24): BNP 265, K 4.1, creatinine 2.83, LFTs normal, hgb 13.6  PMH: 1. CAD: s/p CABG x 4 in [place] in 2005.  - Cardiolite (1/20): EF 47%, small fixed apical septal defect, no ischemia.  Low risk study.  - Cardiolite (12/22): small apical inferior defect, no changes from prior, no ischemia. Low risk study. 2. Asthma 3. Gout 4. Atrial fibrillation: Chronic.  5. ESRD s/p renal transplantation at [facility] in 2012.  6. Anemia of chronic disease 7. Type II diabetes 8. HTN 9. Hyperlipidemia 10. OSA: On bipap.  11. Ischemic cardiomyopathy: EF 35-40% in past.  - Cardiolite (1/20) with EF 47%.  - Echo (1/20): EF 50-55%, mild MR, RV moderately dilated with moderately decreased  systolic function, PASP 73 mmHg, moderate TR.   - Echo (3/20): EF 60-65%, PASP 75 mmHg, mildly dilated RV with mildly decreased systolic function.  - Echo (2/21): EF 45-50%, global HK, moderately dilated RV with moderately decreased systolic function, D-shaped interventricular septum, dilated IVC.  - RHC (2/21): mean RA 19, PA 78/31 mean 51, mean PCWP 26, CI 3.26, PVR 3.25 - Echo (5/23): EF 40-45%, RV mildly reduced, GIIIDD (restrictive) - Echo (8/23): EF 40-45%, diffuse hypokinesis with dyssynchrony, mildly dilated RV with mildly decreased systolic function, IVC dilated, moderate AS with mean gradient 15, mild-moderate mitral stenosis with mean gradient 6.  - Echo (1/24): Echo showed EF 40-45%, RV moderately reduced and RVSP 49 mmHg, mild MR, no AS, dilated IVC.  - RHC (1/24): RA mean 13, PA 62/24 (mean 40), PCWP mean 20, CO/CI (Fick) 7.63/3.26, PVR 2.6 WU, PAPi 2.9 12. Obesity: s/p gastric bypass in 2015.  13. Bradycardia with beta blocker.  14. Aortic stenosis: Moderate on 8/23 echo.  15. Mitral stenosis: Mild to moderate on 8/23 echo.  16. Rheumatoid arthritis 17. IgG monoclonal gammopathy 18. PAD: ABIs (10/24) with noncompressible ABIs, normal TBIs.  Past Surgical History:  Procedure Laterality Date   CARDIAC CATHETERIZATION N/A 08/10/2002   CATARACT EXTRACTION W/PHACO Right 10/29/2017   Procedure: CATARACT EXTRACTION PHACO AND INTRAOCULAR LENS PLACEMENT (IOC)  RIGHT DIABETIC;  Surgeon: Lockie Mola, MD;  Location: [facility];  Service: Ophthalmology;  Laterality: Right   CATARACT EXTRACTION W/PHACO Left 11/18/2017   Procedure: CATARACT EXTRACTION PHACO AND INTRAOCULAR LENS PLACEMENT (IOC) LEFT IVA/TOPICAL;  Surgeon: Lockie Mola, MD;  Location: [facility];  Service: Ophthalmology;  Laterality: Left   COLONOSCOPY WITH PROPOFOL N/A 04/22/2013   Procedure: COLONOSCOPY WITH PROPOFOL;  Surgeon: Rachael Fee, MD;  Location: [facility];  Service:  Endoscopy;  Laterality: N/A;   CORONARY ANGIOPLASTY WITH STENT PLACEMENT N/A 1996   CORONARY ARTERY BYPASS GRAFT N/A 08/13/2002   Procedure: 4v CORONARY ARTERY BYPASS GRAFTING; Location: [facility]; Surgeon: Katy Apo, MD   CYSTOSCOPY WITH INSERTION OF UROLIFT N/A 12/25/2021   Procedure: CYSTOSCOPY WITH INSERTION OF UROLIFT;  Surgeon: Riki Altes, MD;  Location: [facility];  Service: Urology;  Laterality: N/A;   DG ANGIO AV SHUNT*L*     right and left upper arms   FASCIOTOMY  03/03/2012   Procedure: FASCIOTOMY;  Surgeon: Nicki Reaper, MD;  Location: [facility];  Service: Orthopedics;  Laterality: Right;  FASCIOTOMY RIGHT SMALL FINGER   FASCIOTOMY Left 08/17/2013   Procedure: FASCIOTOMY LEFT RING;  Surgeon: Nicki Reaper, MD;  Location: [facility];  Service: Orthopedics;  Laterality: Left;   INCISION AND DRAINAGE ABSCESS Left 10/15/2015   Procedure: INCISION AND DRAINAGE ABSCESS;  Surgeon: Leafy Ro, MD;  Location: [facility];  Service: General;  Laterality: Left;   KIDNEY TRANSPLANT  09/13/2010   cadaver--at [facility]   LASER ABLATION CONDOLAMATA N/A 01/08/2023   Procedure: LASER REMOVAL ABLATION OF CONDYLOMATA;  Surgeon: Karie Soda, MD;  Location: [facility];  Service: General;  Laterality: N/A;  GEN AND LOCAL   RECTAL EXAM UNDER ANESTHESIA N/A 01/08/2023   Procedure: ANORECTAL EXAM UNDER ANESTHESIA;  Surgeon: Karie Soda, MD;  Location: [facility];  Service: General;  Laterality: N/A;   RIGHT HEART CATH N/A 11/15/2016   Procedure: RIGHT HEART CATH;  Surgeon: Iran Ouch, MD;  Location: [facility];  Service: Cardiovascular;  Laterality: N/A;   RIGHT HEART CATH N/A 05/03/2019   Procedure: RIGHT HEART CATH;  Surgeon: Laurey Morale, MD;  Location: [facility] CV [facility];  Service: Cardiovascular;  Laterality: N/A;   RIGHT HEART CATH N/A 04/04/2022   Procedure: RIGHT HEART CATH;  Surgeon: Laurey Morale, MD;  Location: [facility] CV [facility];   Service: Cardiovascular;  Laterality: N/A;   ROUX-EN-Y GASTRIC BYPASS N/A 2015   TYMPANIC MEMBRANE REPAIR Left 03/2010   VASECTOMY     WART FULGURATION N/A 05/31/2022   Procedure: FULGURATION ANAL WART;  Surgeon: Sung Amabile, DO;  Location: [facility];  Service: General;  Laterality: N/A;   Current Outpatient Medications  Medication Sig Dispense Refill   albuterol (PROVENTIL) (2.5 MG/3ML) 0.083% nebulizer solution Take 3 mLs (2.5 mg total) by nebulization every 6 (six) hours as needed for wheezing or shortness of breath. 150 mL 0   albuterol (VENTOLIN HFA) 108 (90 Base) MCG/ACT inhaler Inhale 2 puffs into the lungs every 6 (six) hours as needed for wheezing or shortness of breath. 6.7 g 5   apixaban (ELIQUIS) 5 MG TABS tablet Take 1 tablet (5 mg total) by mouth 2 (two) times daily. 180 tablet 3  buPROPion (WELLBUTRIN SR) 150 MG 12 hr tablet Take 1 tablet (150 mg total) by mouth 2 (two) times daily. 180 tablet 3   cholecalciferol (VITAMIN D3) 25 MCG (1000 UNIT) tablet Take 1,000 Units by mouth at bedtime.     colchicine 0.6 MG tablet Take 1 tablet (0.6 mg total) by mouth every other day. 45 tablet 1   Continuous Glucose Sensor (FREESTYLE LIBRE 3 SENSOR) MISC Use one every 2 weeks. 6 each 3   COVID-19 mRNA vaccine, Pfizer, (COMIRNATY) syringe Inject into the muscle. 0.3 mL 0   cyclobenzaprine (FLEXERIL) 5 MG tablet Take by mouth. (Patient not taking: Reported on 01/06/2023)     dapagliflozin propanediol (FARXIGA) 10 MG TABS tablet Take 1 tablet (10 mg total) by mouth daily before breakfast. 90 tablet 3   febuxostat (ULORIC) 40 MG tablet Take 3 tablets (120 mg total) by mouth daily. 90 tablet 5   fluticasone (FLONASE) 50 MCG/ACT nasal spray Place 2 sprays into both nostrils daily. 16 g 11   gabapentin (NEURONTIN) 300 MG capsule Take 1 capsule (300 mg total) by mouth 2 (two) times daily. 180 capsule 0   gabapentin (NEURONTIN) 300 MG capsule Take 1 capsule (300 mg total) by mouth 2 (two) times daily.  180 capsule 0   HYDROcodone-acetaminophen (NORCO/VICODIN) 5-325 MG tablet Take 1-2 tablets by mouth every 6 (six) hours as needed for moderate pain (pain score 4-6). 30 tablet 0   HYDROcodone-acetaminophen (NORCO/VICODIN) 5-325 MG tablet Take 1-2 tablets by mouth every 8 (eight) hours as needed for moderate pain (pain score 4-6) for up to 5 days 30 tablet 0   hydrocortisone 2.5 % cream Apply topically 3 (three) times daily as needed. 30 g 3   influenza vac split trivalent PF (FLULAVAL) 0.5 ML injection Inject into the muscle. 0.5 mL 0   insulin glargine-yfgn (SEMGLEE) 100 UNIT/ML Pen Inject 40 Units into the skin daily. (Patient taking differently: Inject 40 Units into the skin at bedtime.) 45 mL 3   insulin lispro (HUMALOG KWIKPEN) 100 UNIT/ML KwikPen Inject 10-20 Units into the skin 3 (three) times daily. Per sliding scale     Insulin Pen Needle (TECHLITE PEN NEEDLES) 31G X 5 MM MISC 4 (four) times daily. 400 each 3   Insulin Pen Needle (TECHLITE PEN NEEDLES) 32G X 6 MM MISC Use 4 times a day 100 each 3   Insulin Pen Needle (UNIFINE PENTIPS) 31G X 5 MM MISC Use 4 times daily as directed 400 each 3   iron polysaccharides (FERREX 150) 150 MG capsule Take 1 capsule (150 mg total) by mouth 2 (two) times daily. 180 capsule 0   lidocaine (LIDODERM) 5 % Place 1 patch onto the skin daily.PLACE 1 PATCH ONTO THE SKIN FOR 12 (TWELVE) HOURS. REMOVE & DISCARD PATCH WITHIN 12 HOURS OR AS DIRECTED BY MD (Patient taking differently: Place 1 patch onto the skin daily as needed.) 60 patch 11   lidocaine (XYLOCAINE) 2 % jelly Apply topically as needed. 30 mL 0   lidocaine (XYLOCAINE) 5 % ointment Apply 1 Application topically 3 (three) times daily as needed. Apply pea size amount to area as needed for pain. 50 g 0   lidocaine (XYLOCAINE) 5 % ointment Apply a pea size amount to area  3 (three) times daily as needed for pain. 50 g 0   losartan (COZAAR) 25 MG tablet Take 1 tablet (25 mg total) by mouth daily. 90 tablet 3    metolazone (ZAROXOLYN) 2.5 MG tablet Take 1 tablet (  2.5 mg total) by mouth 2 (two) times a week. 8 tablet 2   montelukast (SINGULAIR) 10 MG tablet Take 1 tablet (10 mg total) by mouth at bedtime. 30 tablet 11   montelukast (SINGULAIR) 10 MG tablet Take 1 tablet (10 mg total) by mouth at bedtime. 30 tablet 11   Multiple Vitamin (MULTIVITAMIN WITH MINERALS) TABS tablet Take 1 tablet by mouth daily.     mycophenolate (MYFORTIC) 180 MG EC tablet Take 2 tablets (360 mg total) by mouth 2 (two) times daily. 360 tablet 3   omeprazole (PRILOSEC) 20 MG capsule Take 1 capsule (20 mg total) by mouth daily. 30 capsule 11   potassium chloride (KLOR-CON) 10 MEQ tablet Take 6 tablets (60 mEq total) by mouth every morning AND 4 tablets (40 mEq total) every evening. 300 tablet 6   predniSONE (DELTASONE) 5 MG tablet Take 1 tablet (5 mg total) by mouth daily. 90 tablet 3   rosuvastatin (CRESTOR) 10 MG tablet Take 1 tablet (10 mg total) by mouth daily. (Patient taking differently: Take 10 mg by mouth every evening.) 90 tablet 3   spironolactone (ALDACTONE) 25 MG tablet Take 0.5 tablets (12.5 mg total) by mouth daily. 45 tablet 3   sulfamethoxazole-trimethoprim (BACTRIM) 400-80 MG tablet Take 1 tablet by mouth 3 (three) times a week every Monday, Wednesday, and Friday. 36 tablet 3   tacrolimus (PROGRAF) 1 MG capsule Take 2 capsules (2 mg total) by mouth 2 times daily. 120 capsule 11   tamsulosin (FLOMAX) 0.4 MG CAPS capsule Take 1 capsule (0.4 mg total) by mouth daily. 30 capsule 11   Testosterone 20.25 MG/ACT (1.62%) GEL Place 2 Pump onto the skin daily. 150 g 3   tirzepatide (MOUNJARO) 2.5 MG/0.5ML Pen Inject 2.5 mg into the skin once a week. 6 mL 1   torsemide (DEMADEX) 100 MG tablet Take 1 tablet (100 mg total) by mouth 2 (two) times daily. 60 tablet 5   zinc sulfate 220 (50 Zn) MG capsule Take 220 mg by mouth daily.     No current facility-administered medications for this visit.   Allergies:   Contrast media  [iodinated contrast media], Iodine, and Ibuprofen   Social History:  The patient  reports that he quit smoking about 28 years ago. His smoking use included cigars. He has never been exposed to tobacco smoke. He has never used smokeless tobacco. He reports that he does not currently use alcohol. He reports that he does not use drugs.   Family History:  The patient's family history includes Breast cancer in his maternal grandmother; Diabetes in his maternal grandmother; Heart disease in his father; Kidney disease in his father; Kidney failure in his father; Liver cancer in his paternal grandmother and paternal uncle; Valvular heart disease in his mother.   ROS:  Please see the history of present illness.   All other systems are personally reviewed and negative.   Recent Labs: 06/25/2022: Magnesium 2.3 08/05/2022: TSH 2.077 10/21/2022: NT-Pro BNP 4,402 12/17/2022: ALT 12; Hemoglobin 13.6; Platelets 158 01/23/2023: B Natriuretic Peptide 163.3; BUN 121; Creatinine, Ser 3.23; Potassium 4.6; Sodium 139  Personally reviewed   Wt Readings from Last 3 Encounters:  01/22/23 253 lb (114.8 kg)  01/08/23 246 lb 14.6 oz (112 kg)  12/17/22 257 lb 12.8 oz (116.9 kg)    BP (!) 143/81   Pulse 64   Wt 253 lb (114.8 kg)   SpO2 95%   BMI 36.30 kg/m  General: NAD Neck: No JVD, no thyromegaly or  thyroid nodule.  Lungs: Clear to auscultation bilaterally with normal respiratory effort. CV: Nondisplaced PMI.  Heart irregular S1/S2, no S3/S4, no murmur.  No peripheral edema.  No carotid bruit.  Normal pedal pulses.  Abdomen: Soft, nontender, no hepatosplenomegaly, no distention.  Skin: Intact without lesions or rashes.  Neurologic: Alert and oriented x 3.  Psych: Normal affect. Extremities: No clubbing or cyanosis.  HEENT: Normal.   Assessment & Plan: 1. Chronic heart failure with Mid Range EF/ With prominent RV failure/pulmonary hypertension:  Ischemic cardiomyopathy.  ?If RV failure is related to severe OSA,  he was a remote smoker. Echo in 2/21 with EF 45-50%, moderately dilated RV with moderately decreased systolic function, moderate pulmonary hypertension. Echo in 1/24 showed EF 40-45% , RV mod reduced, RVSP 49 mmHg, no AS, mild MR. Management complicated by CKD stage 3b.  Today, NYHA class II in terms of dyspnea, doing well.  He is not volume overloaded by exam and weight is down.   - Continue torsemide 100 mg bid and metolazone twice a week unless creatinine is up. BMET/BNP today.   - He is off hydralazine with hypotension/orthostasis, think he can also stop Imdur.  - Continue Farxiga 10 mg daily. Will need to watch closely for further UTIs.  Had recent Serratia pyelonephritis.  - Continue losartan 25 mg daily.    - Continue spironolactone 12.5 mg daily.  - Tadalafil was stopped due to group 2 and 3 PH (not group 1).  - I am going to aim for repeat echo.  2. Atrial fibrillation: Chronic. Rate controlled.  - Continue Eliquis.  - Not on beta blocker with history of bradycardia.   3. CAD: S/p CABG 2005.  Cardiolite 12/22 with prior MI, no ischemia.  No chest pain. High threshold for cath given CKD.  - No ASA given Eliquis use.  - Continue Crestor 10 mg daily, check lipids today.  4. CKD Stage 3b: S/p renal transplantation in 2012. Followed by transplant center at [facility]. He remains on mycophenolate and tacrolimus.    - BMET today. 5. OSA: Continue Bipap every night.  6. HTN: Off hydralazine with low BP/orthostasis, will also stop Imdur.   7. DM 2: On insulin. He follows with Endocrinology. - Continue dapagliflozin for now but watch for further GU symptoms. 8. Pulmonary hypertension: Primarily pulmonary venous hypertension by RHC in 1/24 though there may be group 3 PH component from OSA.  - Now off tadalafil.  9. Chronic Anemia: has been getting scheduled weekly IV Fe infusions, q Fridays 10. IgG monoclonal protein: Needs followup with his hematologist.    Followup in 3 months.   Marca Ancona 01/23/2023

## 2023-01-24 ENCOUNTER — Other Ambulatory Visit: Payer: Self-pay

## 2023-01-24 MED ORDER — FREESTYLE LIBRE 3 SENSOR MISC
3 refills | Status: DC
  Filled 2023-01-24: qty 6, 84d supply, fill #0
  Filled 2023-03-12 – 2023-04-01 (×3): qty 6, 84d supply, fill #1
  Filled 2023-04-19 – 2023-07-23 (×3): qty 6, 84d supply, fill #2
  Filled 2023-10-29: qty 6, 84d supply, fill #3

## 2023-01-27 ENCOUNTER — Other Ambulatory Visit: Payer: Self-pay

## 2023-01-27 ENCOUNTER — Telehealth (HOSPITAL_COMMUNITY): Payer: Self-pay | Admitting: Cardiology

## 2023-01-27 DIAGNOSIS — I5042 Chronic combined systolic (congestive) and diastolic (congestive) heart failure: Secondary | ICD-10-CM

## 2023-01-27 MED ORDER — METOLAZONE 2.5 MG PO TABS
2.5000 mg | ORAL_TABLET | ORAL | 2 refills | Status: DC
  Filled 2023-01-27: qty 4, 28d supply, fill #0

## 2023-01-27 MED ORDER — TORSEMIDE 20 MG PO TABS
80.0000 mg | ORAL_TABLET | Freq: Two times a day (BID) | ORAL | 5 refills | Status: DC
  Filled 2023-01-27: qty 240, 30d supply, fill #0

## 2023-01-27 NOTE — Telephone Encounter (Signed)
Electa Sniff, RN 01/23/2023  4:17 PM EDT Back to Top    No answer. Lvm with call back number.   Laurey Morale, MD 01/23/2023  2:49 PM EDT     Hold torsemide for 1 day and decrease to 80 mg bid. Hold the next 2 doses of metolazone then decrease to once a week after that.  BMET in 1 week.    Pt returned call aware of results  Meds sent to pharmacy Repeated labs sent to [facility]

## 2023-01-29 ENCOUNTER — Telehealth: Payer: Self-pay | Admitting: Cardiology

## 2023-01-30 NOTE — Telephone Encounter (Signed)
Called pt regarding dizzy spells msg. Pt is not ans at this time. Lvm with call back number.

## 2023-02-03 ENCOUNTER — Encounter: Payer: Self-pay | Admitting: Emergency Medicine

## 2023-02-03 ENCOUNTER — Other Ambulatory Visit
Admission: RE | Admit: 2023-02-03 | Discharge: 2023-02-03 | Disposition: A | Discharge status: Home / Self Care | Payer: Commercial Managed Care - PPO | Source: Home / Self Care | Attending: Cardiology | Admitting: Cardiology

## 2023-02-03 ENCOUNTER — Other Ambulatory Visit: Payer: Commercial Managed Care - PPO

## 2023-02-03 ENCOUNTER — Other Ambulatory Visit: Payer: Self-pay

## 2023-02-03 ENCOUNTER — Emergency Department: Payer: Commercial Managed Care - PPO

## 2023-02-03 ENCOUNTER — Observation Stay
Admission: EM | Admit: 2023-02-03 | Discharge: 2023-02-05 | Disposition: A | Discharge status: Home / Self Care | Payer: Commercial Managed Care - PPO | Attending: Internal Medicine | Admitting: Internal Medicine

## 2023-02-03 DIAGNOSIS — I5042 Chronic combined systolic (congestive) and diastolic (congestive) heart failure: Secondary | ICD-10-CM | POA: Insufficient documentation

## 2023-02-03 DIAGNOSIS — N170 Acute kidney failure with tubular necrosis: Secondary | ICD-10-CM | POA: Diagnosis present

## 2023-02-03 DIAGNOSIS — F32A Depression, unspecified: Secondary | ICD-10-CM | POA: Diagnosis not present

## 2023-02-03 DIAGNOSIS — T383X5A Adverse effect of insulin and oral hypoglycemic [antidiabetic] drugs, initial encounter: Secondary | ICD-10-CM | POA: Insufficient documentation

## 2023-02-03 DIAGNOSIS — E875 Hyperkalemia: Secondary | ICD-10-CM | POA: Diagnosis present

## 2023-02-03 DIAGNOSIS — Z7960 Long term (current) use of unspecified immunomodulators and immunosuppressants: Secondary | ICD-10-CM

## 2023-02-03 DIAGNOSIS — R4701 Aphasia: Principal | ICD-10-CM

## 2023-02-03 DIAGNOSIS — Z79899 Other long term (current) drug therapy: Secondary | ICD-10-CM | POA: Insufficient documentation

## 2023-02-03 DIAGNOSIS — G9389 Other specified disorders of brain: Secondary | ICD-10-CM | POA: Diagnosis not present

## 2023-02-03 DIAGNOSIS — Z6841 Body Mass Index (BMI) 40.0 and over, adult: Secondary | ICD-10-CM | POA: Insufficient documentation

## 2023-02-03 DIAGNOSIS — N184 Chronic kidney disease, stage 4 (severe): Secondary | ICD-10-CM | POA: Diagnosis not present

## 2023-02-03 DIAGNOSIS — H538 Other visual disturbances: Secondary | ICD-10-CM | POA: Diagnosis not present

## 2023-02-03 DIAGNOSIS — E1142 Type 2 diabetes mellitus with diabetic polyneuropathy: Secondary | ICD-10-CM | POA: Diagnosis not present

## 2023-02-03 DIAGNOSIS — T65891A Toxic effect of other specified substances, accidental (unintentional), initial encounter: Secondary | ICD-10-CM | POA: Insufficient documentation

## 2023-02-03 DIAGNOSIS — E1169 Type 2 diabetes mellitus with other specified complication: Secondary | ICD-10-CM | POA: Diagnosis present

## 2023-02-03 DIAGNOSIS — R4781 Slurred speech: Secondary | ICD-10-CM | POA: Diagnosis not present

## 2023-02-03 DIAGNOSIS — I255 Ischemic cardiomyopathy: Secondary | ICD-10-CM | POA: Diagnosis present

## 2023-02-03 DIAGNOSIS — Z940 Kidney transplant status: Secondary | ICD-10-CM | POA: Diagnosis not present

## 2023-02-03 DIAGNOSIS — E785 Hyperlipidemia, unspecified: Secondary | ICD-10-CM | POA: Insufficient documentation

## 2023-02-03 DIAGNOSIS — I6523 Occlusion and stenosis of bilateral carotid arteries: Secondary | ICD-10-CM | POA: Diagnosis not present

## 2023-02-03 DIAGNOSIS — Z79621 Long term (current) use of calcineurin inhibitor: Secondary | ICD-10-CM | POA: Insufficient documentation

## 2023-02-03 DIAGNOSIS — X58XXXA Exposure to other specified factors, initial encounter: Secondary | ICD-10-CM | POA: Insufficient documentation

## 2023-02-03 DIAGNOSIS — I4821 Permanent atrial fibrillation: Secondary | ICD-10-CM | POA: Diagnosis present

## 2023-02-03 DIAGNOSIS — I6782 Cerebral ischemia: Secondary | ICD-10-CM | POA: Diagnosis not present

## 2023-02-03 DIAGNOSIS — D75838 Other thrombocytosis: Secondary | ICD-10-CM | POA: Insufficient documentation

## 2023-02-03 DIAGNOSIS — K521 Toxic gastroenteritis and colitis: Secondary | ICD-10-CM | POA: Insufficient documentation

## 2023-02-03 DIAGNOSIS — I270 Primary pulmonary hypertension: Secondary | ICD-10-CM | POA: Insufficient documentation

## 2023-02-03 DIAGNOSIS — I872 Venous insufficiency (chronic) (peripheral): Secondary | ICD-10-CM | POA: Diagnosis not present

## 2023-02-03 DIAGNOSIS — G9341 Metabolic encephalopathy: Secondary | ICD-10-CM | POA: Insufficient documentation

## 2023-02-03 DIAGNOSIS — Z951 Presence of aortocoronary bypass graft: Secondary | ICD-10-CM | POA: Insufficient documentation

## 2023-02-03 DIAGNOSIS — Z79624 Long term (current) use of inhibitors of nucleotide synthesis: Secondary | ICD-10-CM | POA: Insufficient documentation

## 2023-02-03 DIAGNOSIS — I2510 Atherosclerotic heart disease of native coronary artery without angina pectoris: Secondary | ICD-10-CM | POA: Diagnosis present

## 2023-02-03 DIAGNOSIS — E66813 Obesity, class 3: Secondary | ICD-10-CM | POA: Insufficient documentation

## 2023-02-03 DIAGNOSIS — E119 Type 2 diabetes mellitus without complications: Secondary | ICD-10-CM | POA: Diagnosis present

## 2023-02-03 DIAGNOSIS — I639 Cerebral infarction, unspecified: Secondary | ICD-10-CM | POA: Diagnosis present

## 2023-02-03 DIAGNOSIS — G510 Bell's palsy: Secondary | ICD-10-CM | POA: Insufficient documentation

## 2023-02-03 DIAGNOSIS — N400 Enlarged prostate without lower urinary tract symptoms: Secondary | ICD-10-CM | POA: Diagnosis present

## 2023-02-03 DIAGNOSIS — Z8669 Personal history of other diseases of the nervous system and sense organs: Secondary | ICD-10-CM | POA: Insufficient documentation

## 2023-02-03 DIAGNOSIS — I25810 Atherosclerosis of coronary artery bypass graft(s) without angina pectoris: Secondary | ICD-10-CM | POA: Diagnosis not present

## 2023-02-03 DIAGNOSIS — D696 Thrombocytopenia, unspecified: Secondary | ICD-10-CM | POA: Insufficient documentation

## 2023-02-03 DIAGNOSIS — Z79620 Long term (current) use of immunosuppressive biologic: Secondary | ICD-10-CM | POA: Insufficient documentation

## 2023-02-03 DIAGNOSIS — I10 Essential (primary) hypertension: Secondary | ICD-10-CM | POA: Diagnosis present

## 2023-02-03 DIAGNOSIS — E1122 Type 2 diabetes mellitus with diabetic chronic kidney disease: Secondary | ICD-10-CM | POA: Insufficient documentation

## 2023-02-03 DIAGNOSIS — I130 Hypertensive heart and chronic kidney disease with heart failure and stage 1 through stage 4 chronic kidney disease, or unspecified chronic kidney disease: Secondary | ICD-10-CM | POA: Insufficient documentation

## 2023-02-03 DIAGNOSIS — E1129 Type 2 diabetes mellitus with other diabetic kidney complication: Secondary | ICD-10-CM | POA: Diagnosis present

## 2023-02-03 DIAGNOSIS — N179 Acute kidney failure, unspecified: Secondary | ICD-10-CM | POA: Insufficient documentation

## 2023-02-03 DIAGNOSIS — Z794 Long term (current) use of insulin: Secondary | ICD-10-CM | POA: Diagnosis not present

## 2023-02-03 DIAGNOSIS — E1165 Type 2 diabetes mellitus with hyperglycemia: Secondary | ICD-10-CM | POA: Diagnosis not present

## 2023-02-03 DIAGNOSIS — Z9884 Bariatric surgery status: Secondary | ICD-10-CM

## 2023-02-03 DIAGNOSIS — G4733 Obstructive sleep apnea (adult) (pediatric): Secondary | ICD-10-CM

## 2023-02-03 DIAGNOSIS — Z7901 Long term (current) use of anticoagulants: Secondary | ICD-10-CM | POA: Insufficient documentation

## 2023-02-03 DIAGNOSIS — N1832 Chronic kidney disease, stage 3b: Secondary | ICD-10-CM | POA: Diagnosis present

## 2023-02-03 DIAGNOSIS — R29818 Other symptoms and signs involving the nervous system: Secondary | ICD-10-CM

## 2023-02-03 DIAGNOSIS — I2721 Secondary pulmonary arterial hypertension: Secondary | ICD-10-CM | POA: Diagnosis present

## 2023-02-03 DIAGNOSIS — Z87891 Personal history of nicotine dependence: Secondary | ICD-10-CM | POA: Insufficient documentation

## 2023-02-03 DIAGNOSIS — E1159 Type 2 diabetes mellitus with other circulatory complications: Secondary | ICD-10-CM | POA: Diagnosis not present

## 2023-02-03 DIAGNOSIS — I5022 Chronic systolic (congestive) heart failure: Secondary | ICD-10-CM | POA: Diagnosis present

## 2023-02-03 LAB — COMPREHENSIVE METABOLIC PANEL
ALT: 15 U/L (ref 0–44)
AST: 24 U/L (ref 15–41)
Albumin: 4.2 g/dL (ref 3.5–5.0)
Alkaline Phosphatase: 128 U/L — ABNORMAL HIGH (ref 38–126)
Anion gap: 8 (ref 5–15)
BUN: 123 mg/dL — ABNORMAL HIGH (ref 8–23)
CO2: 25 mmol/L (ref 22–32)
Calcium: 9.1 mg/dL (ref 8.9–10.3)
Chloride: 104 mmol/L (ref 98–111)
Creatinine, Ser: 4.3 mg/dL — ABNORMAL HIGH (ref 0.61–1.24)
GFR, Estimated: 15 mL/min — ABNORMAL LOW (ref >60)
Glucose, Bld: 97 mg/dL (ref 70–99)
Potassium: 6 mmol/L — ABNORMAL HIGH (ref 3.5–5.1)
Sodium: 137 mmol/L (ref 135–145)
Total Bilirubin: 0.5 mg/dL (ref <1.2)
Total Protein: 8.4 g/dL — ABNORMAL HIGH (ref 6.5–8.1)

## 2023-02-03 LAB — CBC
HCT: 43 % (ref 39.0–52.0)
Hemoglobin: 13.7 g/dL (ref 13.0–17.0)
MCH: 28.7 pg (ref 26.0–34.0)
MCHC: 31.9 g/dL (ref 30.0–36.0)
MCV: 90 fL (ref 80.0–100.0)
Platelets: 179 10*3/uL (ref 150–400)
RBC: 4.78 MIL/uL (ref 4.22–5.81)
RDW: 13.3 % (ref 11.5–15.5)
WBC: 5.6 10*3/uL (ref 4.0–10.5)
nRBC: 0 % (ref 0.0–0.2)

## 2023-02-03 LAB — CBG MONITORING, ED: Glucose-Capillary: 117 mg/dL — ABNORMAL HIGH (ref 70–99)

## 2023-02-03 LAB — BASIC METABOLIC PANEL
Anion gap: 10 (ref 5–15)
Anion gap: 7 (ref 5–15)
BUN: 137 mg/dL — ABNORMAL HIGH (ref 8–23)
BUN: 131 mg/dL — ABNORMAL HIGH (ref 8–23)
CO2: 24 mmol/L (ref 22–32)
CO2: 23 mmol/L (ref 22–32)
Calcium: 9 mg/dL (ref 8.9–10.3)
Calcium: 8.9 mg/dL (ref 8.9–10.3)
Chloride: 104 mmol/L (ref 98–111)
Chloride: 106 mmol/L (ref 98–111)
Creatinine, Ser: 4.31 mg/dL — ABNORMAL HIGH (ref 0.61–1.24)
Creatinine, Ser: 4.14 mg/dL — ABNORMAL HIGH (ref 0.61–1.24)
GFR, Estimated: 15 mL/min — ABNORMAL LOW (ref >60)
GFR, Estimated: 15 mL/min — ABNORMAL LOW (ref >60)
Glucose, Bld: 173 mg/dL — ABNORMAL HIGH (ref 70–99)
Glucose, Bld: 154 mg/dL — ABNORMAL HIGH (ref 70–99)
Potassium: 5.6 mmol/L — ABNORMAL HIGH (ref 3.5–5.1)
Potassium: 5.4 mmol/L — ABNORMAL HIGH (ref 3.5–5.1)
Sodium: 138 mmol/L (ref 135–145)
Sodium: 136 mmol/L (ref 135–145)

## 2023-02-03 LAB — DIFFERENTIAL
Abs Immature Granulocytes: 0.02 10*3/uL (ref 0.00–0.07)
Basophils Absolute: 0 10*3/uL (ref 0.0–0.1)
Basophils Relative: 0 %
Eosinophils Absolute: 0 10*3/uL (ref 0.0–0.5)
Eosinophils Relative: 1 %
Immature Granulocytes: 0 %
Lymphocytes Relative: 25 %
Lymphs Abs: 1.4 10*3/uL (ref 0.7–4.0)
Monocytes Absolute: 0.4 10*3/uL (ref 0.1–1.0)
Monocytes Relative: 7 %
Neutro Abs: 3.7 10*3/uL (ref 1.7–7.7)
Neutrophils Relative %: 67 %

## 2023-02-03 LAB — ETHANOL: Alcohol, Ethyl (B): <10 mg/dL (ref <10)

## 2023-02-03 LAB — APTT: aPTT: 37 s — ABNORMAL HIGH (ref 24–36)

## 2023-02-03 LAB — PROTIME-INR
INR: 1.2 (ref 0.8–1.2)
Prothrombin Time: 15.6 s — ABNORMAL HIGH (ref 11.4–15.2)

## 2023-02-03 MED ORDER — ACETAMINOPHEN 650 MG RE SUPP: 650.0000 mg | RECTAL | Status: DC | PRN

## 2023-02-03 MED ORDER — INSULIN ASPART 100 UNIT/ML IJ SOLN
0.0000 [IU] | Freq: Three times a day (TID) | INTRAMUSCULAR | Status: DC
  Administered 2023-02-04: 2 [IU] via SUBCUTANEOUS
  Administered 2023-02-05: 3 [IU] via SUBCUTANEOUS
  Filled 2023-02-03 (×2): qty 1

## 2023-02-03 MED ORDER — PANTOPRAZOLE SODIUM 40 MG PO TBEC
40.0000 mg | DELAYED_RELEASE_TABLET | Freq: Every day | ORAL | Status: DC
  Administered 2023-02-04 – 2023-02-05 (×2): 40 mg via ORAL
  Filled 2023-02-03 (×2): qty 1

## 2023-02-03 MED ORDER — INSULIN ASPART 100 UNIT/ML IJ SOLN: 0.0000 [IU] | Freq: Every day | INTRAMUSCULAR | Status: DC

## 2023-02-03 MED ORDER — DAPAGLIFLOZIN PROPANEDIOL 10 MG PO TABS
10.0000 mg | ORAL_TABLET | Freq: Every day | ORAL | Status: DC
  Administered 2023-02-04 – 2023-02-05 (×2): 10 mg via ORAL
  Filled 2023-02-03 (×2): qty 1

## 2023-02-03 MED ORDER — ACETAMINOPHEN 325 MG PO TABS
650.0000 mg | ORAL_TABLET | ORAL | Status: DC | PRN
  Administered 2023-02-04 (×2): 650 mg via ORAL
  Filled 2023-02-03 (×2): qty 2

## 2023-02-03 MED ORDER — HYDROCODONE-ACETAMINOPHEN 5-325 MG PO TABS
1.0000 | ORAL_TABLET | Freq: Four times a day (QID) | ORAL | Status: DC | PRN
  Administered 2023-02-03: 1 via ORAL
  Filled 2023-02-03: qty 1

## 2023-02-03 MED ORDER — SODIUM CHLORIDE 0.9% FLUSH
3.0000 mL | Freq: Once | INTRAVENOUS | Status: AC
  Administered 2023-02-03: 3 mL via INTRAVENOUS

## 2023-02-03 MED ORDER — COLCHICINE 0.6 MG PO TABS
0.6000 mg | ORAL_TABLET | ORAL | Status: DC
  Administered 2023-02-05: 0.6 mg via ORAL
  Filled 2023-02-03: qty 1

## 2023-02-03 MED ORDER — INSULIN GLARGINE-YFGN 100 UNIT/ML ~~LOC~~ SOPN
40.0000 [IU] | PEN_INJECTOR | Freq: Every day | SUBCUTANEOUS | Status: DC
Start: 2023-02-04 — End: 2023-02-05
  Administered 2023-02-04: 40 [IU] via SUBCUTANEOUS
  Filled 2023-02-03: qty 0.4

## 2023-02-03 MED ORDER — STROKE: EARLY STAGES OF RECOVERY BOOK: Freq: Once | Status: AC

## 2023-02-03 MED ORDER — GABAPENTIN 300 MG PO CAPS
300.0000 mg | ORAL_CAPSULE | Freq: Two times a day (BID) | ORAL | Status: DC
  Administered 2023-02-04 – 2023-02-05 (×3): 300 mg via ORAL
  Filled 2023-02-03 (×3): qty 1

## 2023-02-03 MED ORDER — SODIUM ZIRCONIUM CYCLOSILICATE 10 G PO PACK
10.0000 g | PACK | Freq: Once | ORAL | Status: AC
  Administered 2023-02-03: 10 g via ORAL
  Filled 2023-02-03: qty 1

## 2023-02-03 MED ORDER — TAMSULOSIN HCL 0.4 MG PO CAPS
0.4000 mg | ORAL_CAPSULE | Freq: Every day | ORAL | Status: DC
  Administered 2023-02-04 – 2023-02-05 (×2): 0.4 mg via ORAL
  Filled 2023-02-03 (×2): qty 1

## 2023-02-03 MED ORDER — BUPROPION HCL ER (SR) 150 MG PO TB12
150.0000 mg | ORAL_TABLET | Freq: Two times a day (BID) | ORAL | Status: DC
  Administered 2023-02-04 – 2023-02-05 (×2): 150 mg via ORAL
  Filled 2023-02-03 (×5): qty 1

## 2023-02-03 MED ORDER — ROSUVASTATIN CALCIUM 10 MG PO TABS
10.0000 mg | ORAL_TABLET | Freq: Every evening | ORAL | Status: DC
  Administered 2023-02-04: 10 mg via ORAL
  Filled 2023-02-03 (×2): qty 1

## 2023-02-03 MED ORDER — ALBUTEROL SULFATE (2.5 MG/3ML) 0.083% IN NEBU: 2.5000 mg | INHALATION_SOLUTION | Freq: Four times a day (QID) | RESPIRATORY_TRACT | Status: DC | PRN

## 2023-02-03 MED ORDER — ACETAMINOPHEN 160 MG/5ML PO SOLN: 650.0000 mg | ORAL | Status: DC | PRN

## 2023-02-03 MED ORDER — STROKE: EARLY STAGES OF RECOVERY BOOK
Freq: Once | Status: DC
Start: 2023-02-04 — End: 2023-02-04

## 2023-02-03 MED ORDER — TACROLIMUS 1 MG PO CAPS
2.0000 mg | ORAL_CAPSULE | Freq: Two times a day (BID) | ORAL | Status: DC
  Administered 2023-02-04 – 2023-02-05 (×3): 2 mg via ORAL
  Filled 2023-02-03 (×3): qty 2

## 2023-02-03 MED ORDER — APIXABAN 5 MG PO TABS
5.0000 mg | ORAL_TABLET | Freq: Two times a day (BID) | ORAL | Status: DC
  Administered 2023-02-04 – 2023-02-05 (×3): 5 mg via ORAL
  Filled 2023-02-03 (×3): qty 1

## 2023-02-03 MED ORDER — MYCOPHENOLATE SODIUM 180 MG PO TBEC
360.0000 mg | DELAYED_RELEASE_TABLET | Freq: Two times a day (BID) | ORAL | Status: DC
  Administered 2023-02-04 – 2023-02-05 (×3): 360 mg via ORAL
  Filled 2023-02-03 (×3): qty 2

## 2023-02-03 MED ORDER — SODIUM CHLORIDE 0.9 % IV BOLUS
500.0000 mL | Freq: Once | INTRAVENOUS | Status: AC
Start: 2023-02-03 — End: 2023-02-03
  Administered 2023-02-03: 500 mL via INTRAVENOUS

## 2023-02-03 MED ORDER — SODIUM CHLORIDE 0.9 % IV BOLUS
500.0000 mL | Freq: Once | INTRAVENOUS | Status: AC
  Administered 2023-02-03: 500 mL via INTRAVENOUS

## 2023-02-03 NOTE — Assessment & Plan Note (Addendum)
Will give Lokelma twice a day and recheck potassium tomorrow.

## 2023-02-03 NOTE — ED Notes (Addendum)
Stuttering speech change noted at 1500. H/o afib and takes eliquis. Last dose this am 0900. 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 H/o ESRD, last HD 15 yrs ago. Denies HA or dizziness.

## 2023-02-03 NOTE — Consult Note (Signed)
[facility] Telemedicine Consult   Requesting Provider: Dr. Lenard Lance   Chief Complaint: stuttering speech  HPI:  62 year old male with history of hypertension, hyperlipidemia, CHF, A-fib on Eliquis, CKD not on dialysis anymore, and left Bell's palsy 20 years ago presented to ED for code stroke.  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  He denies any arm or leg weakness, numbness, vision changes, denies headache.  Patient states that stuttering fluctuated but seems to getting worse.  He also reported feeling tired, decreased energy level.  He came to ER for evaluation.   ED, he still has stuttering speech, and chronic left facial droop, but no other significant finding.  CT head no ICH or acute finding.  Patient on Eliquis at home, last dose 9 AM this morning, he stated that he compliant with medication.  He is creatinine 4.3 today but only 3.2 on 01/23/2023, consistent with AKI.  Per patient he had bad diarrhea and likely dehydrated.  His potassium also 6.0, elevated, however AOx3, no significant encephalopathy.  LKW: 3 PM tpa given?: No, on Eliquis, likely not stroke IR Thrombectomy? No, no symptoms or signs of LVO Modified Rankin Scale: 2-Slight disability-UNABLE to perform all activities but does not need assistance   Exam: Vitals:   02/03/23 1647  BP: (!) 147/93  Pulse: 80  Resp: 18  Temp: 97.8 F (36.6 C)  SpO2: 98%     Temp:  [97.8 F (36.6 C)] 97.8 F (36.6 C) (11/11 1647) Pulse Rate:  [80] 80 (11/11 1647) Resp:  [18] 18 (11/11 1647) BP: (147)/(93) 147/93 (11/11 1647) SpO2:  [98 %] 98 % (11/11 1647) Weight:  [161 kg] 112 kg (11/11 1648)  General - Well nourished, well developed, in no apparent distress.  Ophthalmologic - fundi not visualized due to noncooperation.  Cardiovascular - irregularly irregular heart rate and rhythm on  telemetry  Neuro - awake, alert, eyes open, orientated to age, place, time. No aphasia, following all simple commands, however nonfluent language with stuttering and difficulty language initiation. Able to name and repeat.  Mild dysarthria.  No gaze palsy, tracking bilaterally, visual field full.  Chronic left facial droop, and left mild proptosis. Tongue midline. Bilateral UEs no drift, equal strength of finger grip. Bilaterally LEs no drift. Sensation symmetrical bilaterally, b/l FTN intact, gait not tested.     NIH Stroke Scale = 3 (left facial, nonfluent language, mild dysarthria)  Imaging Reviewed:  CT head - no acute finding on my preliminary review  Labs reviewed in epic and pertinent values follow:  Latest Reference Range & Units 02/03/23 16:50  Sodium 135 - 145 mmol/L 137  Potassium 3.5 - 5.1 mmol/L 6.0 (H)  Chloride 98 - 111 mmol/L 104  CO2 22 - 32 mmol/L 25  Glucose 70 - 99 mg/dL 97  BUN 8 - 23 mg/dL 096 (H)  Creatinine 0.45 - 1.24 mg/dL 4.09 (H)  Calcium 8.9 - 10.3 mg/dL 9.1  Anion gap 5 - 15  8  (H): Data is abnormally high  Assessment:  62 year old male with history of hypertension, hyperlipidemia, CHF, A-fib on Eliquis, CKD not on dialysis anymore, and left Bell's palsy 20 years ago presented to ED for stuttering speech, difficulty with language fluency.  Time onset 3 PM.  NIH score 3.  CT head no ICH or acute finding.  Patient on Eliquis at home, last dose 9 AM this morning. His  creatinine 4.3 today and potassium 6.0, consistent with AKI on CKD.  Patient not TNK candidate given on Eliquis and likely not stroke.  Not IR candidate given no signs of symptoms or LVO.  Etiology for patient's symptoms concerning for encephalopathy given AKI on CKD with hyperkalemia versus functional presentation with stuttering.  Recommend MRI brain and MRA head and neck to rule out stroke and LVO.  Recommend 2D echo as scheduled in October but not done.  Check A1c, LDL and UDS for stroke workup.   Okay to continue Eliquis after MRI showing no stroke.  Management of AKI on CKD, dehydration and hyperkalemia per EDP.  Do not feel EEG needed at this time, however may consider if needed later.   Recommendations:  Continue further stroke and encephalopathy work up  Frequent neuro checks Telemetry monitoring Routine MRI brain  Routine MRA head and neck Echocardiogram as scheduled in October but not done UDS, fasting lipid panel and HgbA1C PT/OT/speech consult GI and DVT prophylaxis  Okay to continue Eliquis if MRI no stroke Management of AKI on CKD, dehydration and hyperkalemia per EDP Discussed with Dr. Lenard Lance ED physician We will follow   Consult Participants: EDP, me, patient and wife and stroke response nurse Location of the provider: [facility] Location of the patient: [facility]  Time Code Stroke Page received: 6:22 PM Time neurologist arrived: 6:24 PM Time NIHSS completed: 6:40 PM  This consult was provided via telemedicine with 2-way video and audio communication. The patient/family was informed that care would be provided in this way and agreed to receive care in this manner.   This patient is receiving care for possible acute neurological changes. There was 55 minutes of care by this provider at the time of service, including time for direct evaluation via telemedicine, review of medical records, imaging studies and discussion of findings with providers, the patient and/or family.  [name] Plan, MD PhD Stroke Neurology 02/03/2023 6:42 PM

## 2023-02-03 NOTE — ED Notes (Signed)
MRI at [facility] screening pt

## 2023-02-03 NOTE — Assessment & Plan Note (Deleted)
History of renal transplant 2012 Long-term immune suppressant therapy Suspect ATN secondary to diuretics including torsemide and metolazone Creatinine 4.3 up from baseline of 2.6 Will give a gentle one-time bolus for possible ATN from dehydration from diuretics Monitor renal function and avoid nephrotoxins Continue Myfortic and tacrolimus

## 2023-02-03 NOTE — Assessment & Plan Note (Signed)
Continue basal insulin.  Sliding scale insulin coverage

## 2023-02-03 NOTE — Assessment & Plan Note (Signed)
History of gastric bypass  OSA on CPAP Complicating factor to overall prognosis and care CPAP nightly

## 2023-02-03 NOTE — Assessment & Plan Note (Signed)
Holding BP lowering meds for permissive hypertension

## 2023-02-03 NOTE — ED Notes (Signed)
Code stroke  called  to  [facility]  at  6:17PM  per  DR  Lenard Lance  MD

## 2023-02-03 NOTE — ED Notes (Signed)
Pt stuttering and speaking with tele neuro.

## 2023-02-03 NOTE — Progress Notes (Addendum)
[facility] Telestroke RN Code Stroke Note  [phone]- Dr Iver Nestle called reporting code stroke at [facility]- she has notified Dr Roda Shutters. Pt has been examined by EDP and CT is completed.  16- Dr Roda Shutters on camera for stroke assessment 1844- NIH =3 (1-facial droop, 2- dysarthria) MRS 1 LKW 1500 - no TNK d/t Eliquis this AM

## 2023-02-03 NOTE — ED Notes (Signed)
NIH exam by tele neuro complete. Recommending routine MRI. Discussed plan with pt/family. Pt/family agreeable.

## 2023-02-03 NOTE — ED Notes (Signed)
Pt endorses diarrhea and dehydration.

## 2023-02-03 NOTE — ED Notes (Signed)
Pts PCP called prior to patients arrival- Per PCP Pt has been having aphasia x 5 days.

## 2023-02-03 NOTE — Assessment & Plan Note (Addendum)
On chronic diuretics with torsemide, spironolactone and metolazone Holding diuretics for now Keep legs elevated

## 2023-02-03 NOTE — Assessment & Plan Note (Addendum)
Ischemic cardiomyopathy (EF 40 to 45% 03/2022) On GDMT with spironolactone, losartan, Farxiga Also on torsemide and metolazone Will hold blood pressure lowering agents for permissive hypertension

## 2023-02-03 NOTE — H&P (Signed)
History and Physical    Patient: [name]. WUJ:[id number] DOB: 07/23/1960 DOA: 02/03/2023 DOS: the patient was seen and examined on 02/03/2023 PCP: [name], MD  Patient coming from: Home  Chief Complaint:  Chief Complaint  Patient presents with   Aphasia    HPI: [name]. is a 62 y.o. male with medical history significant for CAD s/p CABG in 2005, ischemic cardiomyopathy (EF 40 to 45% 03/2022), chronic A. Fib on Eliquis, aortic stenosis, HTN, type 2 DM, ESRD s/p renal transplant in 2012 on chronic immunosuppressants, CKD 4, PAH, OSA on CPAP , gout , left facial droop from remote Bell's palsy, who was sent from the [place] clinic as a code stroke after he presented there with a reported 5-day history of getting his words out.  Symptoms of stuttering speech acutely worsened at 3 PM on 02/03/2023  while speaking to his minister on the phone. He denied one-sided numbness weakness or tingling, headache or visual disturbance. Review of systems reveals that for the past 2 weeks, patient has been having  nonbloody diarrhea 3-4 times a day,.  Symptoms started soon after his first shot of Mounjaro for weight loss.  He has received 2 weekly doses so far.  He continues to have loose stool.  He denies vomiting, abdominal pain, fever or chills.  Denies chest pain or shortness of breath. At the time of my evaluation patient felt like his speech was back to baseline Code stroke workup and ED course: Stat head CT per stroke protocol showed no intracranial hemorrhage or acute finding. He was seen by teleneurology in consultation, with NIH of 3 and was not deemed a tPA candidate due to being on Eliquis, and low suspicion acute stroke.  Admission for stroke and encephalopathy workup was recommended Additional ED course and data review: On arrival BP was 147/93 with otherwise normal vitals Labs notable for  creatinine of 4.30 up from baseline of 2.6 in August 2024  with  potassium of 6, normal bicarb.  CBC WNL.  EtOH less than 10 EKG, personally viewed and interpreted showing A-fib at 66 MRI brain and MRA head and neck currently pending  Patient received Lokelma in the ED to treat hyperkalemia Hospitalist consulted for admission.   Review of Systems: As mentioned in the history of present illness. All other systems reviewed and are negative.  Past Medical History:  Diagnosis Date   Anal condyloma    Anemia    Aortic atherosclerosis (HCC)    Aortic stenosis    a.) TTE 10/2021: Mod AS. AVA 1.06cm^2 (VTI). Mean grad ; b.) TTE 04/02/2022: no AV stenosis   Asthma, persistent controlled 02/25/2013   Attention or concentration deficit 04/26/2021   BPH with obstruction/lower urinary tract symptoms 09/25/2014   CAD (coronary artery disease) 2005   a.) LHC 1996 -> HG stenosis mLAD -> PTCA/PCI with stent x 1 (unk type) -> complicated by CFA pseudoanurysm (required surg);  b.) LHC 08/10/2002  -> 95% LAD, 70% ISR LAD, 80% pOM, 70% mRCA --> CVTS consult; c.) 4v CABG 08/13/2002; c.) 03/2018 MV: Small, fixed inferoapical and apical sep defect. No isc. EF 47% (60-65% by 05/2018 TTE); d.) 02/2021 MV: small Apical inf fixed defect. No isc -> low risk.   Cardiomegaly    Cellulitis and abscess of left leg 07/22/2020   Chronic atrial fibrillation    a.) CHA2DS2VASc = 4 (CHF, HTN, vascular disease history, T2DM);  b.) rate/rhythm maintained without pharmacological intervention; chronically anticoagulated with  apixaban   Chronic combined systolic and diastolic CHF (congestive heart failure)    a.) TTE 7/18 : EF 45-50%; b.) TTE 05/2018 : EF 60-65%, RVSP 74.8; c.) TTE 12/2018: EF 50-55%. Sev dil LA; d.) TTE 04/2019: EF 45-50%; e.) TTE 07/2021: EF 40-45%, G3DD; f.) TTE 10/2021: EF 40-45%, mild conc LVH, septal-lat dyssynchrony (LBBB), mildly red RVSF, mild-mod dil LA, mildly dil RA, mild-mod MS, mod AS; g.) TTE 04/02/2022: EF 40-45%, glob HK, LVH, red RVSF, RVE, sev LAE, mild-mod RAE,  mild MR   Chronic venous stasis dermatitis of both lower extremities    Cytomegaloviral disease 2017   Diverticulosis of colon 04/22/2013   Dyspnea    Elevated RIGHT hemidiaphragm    Erectile dysfunction    a.) on topical TRT + Trimix (alprostadil/papaverine/phentolamine) injections   ESRD (end stage renal disease) (HCC)    a.) dialysis dependent 2004-2012; b.) s/p cadaveric RIGHT renal transplant 09/13/2010   Essential hypertension 12/17/2010   GERD (gastroesophageal reflux disease)    Gout    History of 2019 novel coronavirus disease (COVID-19) 06/30/2019   History of bilateral cataract extraction 10/2017   History of renal transplant 09/13/2010   a.) s/p cadaveric donor transplant 09/13/2010   Hx of bilateral cataract extraction 10/2017   Hyperlipidemia    Hypogonadism in male 09/25/2014   Ischemic cardiomyopathy    Left cephalic vein thrombosis 06/2019   Long term current use of anticoagulant    a.) apixaban   Long term current use of immunosuppressive drug    a.) mycophenolate + prednisone + tacrolimus   Major depressive disorder 10/04/2013   Mitral stenosis 11/07/2021   a.) TTE 11/07/2021: severe MAC, mild-mod MS (MPG 6 mmHg); b.) TTE 04/02/2022: no MV stenosis   Murmur    Obesity hypoventilation syndrome 03/31/2012   OSA treated with BiPAP 09/29/2012   PAH (pulmonary artery hypertension)    a. 04/2019 RHC: RA 19, RV 80/20, PA 78/31 (51), PCWP 25, CO/CI 7.69/3.26. PVR 3.25 --> Sev PAH, likely primarily PV HTN; b.) RHC 04/04/2022: mRA 13, mPA 40, mPCWP 20, PA sat 59, AO sat 92, CO 7.63, CI 3.26, PVR 2.6 --> mod portal venous HTN   Pancreatitis    S/P CABG x 4 08/13/2002   a.) LIMA-LAD, LRA-OM, SVG-D1, SVG-dRCA   S/P gastric bypass    Synovitis of finger 06/11/2019   Trigger finger, unspecified little finger 12/23/2018   Type 2 diabetes mellitus with hyperglycemia, with long-term current use of insulin 09/09/2014   a.) has FreeStyle Libre CGM   Ulcer    Left shin goes to  wound care center [facility] qweek   Wears glasses    Wears hearing aid in both ears    Past Surgical History:  Procedure Laterality Date   CARDIAC CATHETERIZATION N/A 08/10/2002   CATARACT EXTRACTION W/PHACO Right 10/29/2017   Procedure: CATARACT EXTRACTION PHACO AND INTRAOCULAR LENS PLACEMENT (IOC)  RIGHT DIABETIC;  Surgeon: Lockie Mola, MD;  Location: [facility];  Service: Ophthalmology;  Laterality: Right   CATARACT EXTRACTION W/PHACO Left 11/18/2017   Procedure: CATARACT EXTRACTION PHACO AND INTRAOCULAR LENS PLACEMENT (IOC) LEFT IVA/TOPICAL;  Surgeon: Lockie Mola, MD;  Location: [facility];  Service: Ophthalmology;  Laterality: Left   COLONOSCOPY WITH PROPOFOL N/A 04/22/2013   Procedure: COLONOSCOPY WITH PROPOFOL;  Surgeon: Rachael Fee, MD;  Location: [facility];  Service: Endoscopy;  Laterality: N/A;   CORONARY ANGIOPLASTY WITH STENT PLACEMENT N/A 1996   CORONARY ARTERY BYPASS GRAFT N/A 08/13/2002  Procedure: 4v CORONARY ARTERY BYPASS GRAFTING; Location: [facility]; Surgeon: Katy Apo, MD   CYSTOSCOPY WITH INSERTION OF UROLIFT N/A 12/25/2021   Procedure: CYSTOSCOPY WITH INSERTION OF UROLIFT;  Surgeon: Riki Altes, MD;  Location: [facility];  Service: Urology;  Laterality: N/A;   DG ANGIO AV SHUNT*L*     right and left upper arms   FASCIOTOMY  03/03/2012   Procedure: FASCIOTOMY;  Surgeon: Nicki Reaper, MD;  Location: [facility];  Service: Orthopedics;  Laterality: Right;  FASCIOTOMY RIGHT SMALL FINGER   FASCIOTOMY Left 08/17/2013   Procedure: FASCIOTOMY LEFT RING;  Surgeon: Nicki Reaper, MD;  Location: [facility];  Service: Orthopedics;  Laterality: Left;   INCISION AND DRAINAGE ABSCESS Left 10/15/2015   Procedure: INCISION AND DRAINAGE ABSCESS;  Surgeon: Leafy Ro, MD;  Location: [facility];  Service: General;  Laterality: Left;   KIDNEY TRANSPLANT  09/13/2010   cadaver--at [facility]   LASER ABLATION  CONDOLAMATA N/A 01/08/2023   Procedure: LASER REMOVAL ABLATION OF CONDYLOMATA;  Surgeon: Karie Soda, MD;  Location: [facility];  Service: General;  Laterality: N/A;  GEN AND LOCAL   RECTAL EXAM UNDER ANESTHESIA N/A 01/08/2023   Procedure: ANORECTAL EXAM UNDER ANESTHESIA;  Surgeon: Karie Soda, MD;  Location: [facility];  Service: General;  Laterality: N/A;   RIGHT HEART CATH N/A 11/15/2016   Procedure: RIGHT HEART CATH;  Surgeon: Iran Ouch, MD;  Location: [facility];  Service: Cardiovascular;  Laterality: N/A;   RIGHT HEART CATH N/A 05/03/2019   Procedure: RIGHT HEART CATH;  Surgeon: Laurey Morale, MD;  Location: [facility];  Service: Cardiovascular;  Laterality: N/A;   RIGHT HEART CATH N/A 04/04/2022   Procedure: RIGHT HEART CATH;  Surgeon: Laurey Morale, MD;  Location: [facility];  Service: Cardiovascular;  Laterality: N/A;   ROUX-EN-Y GASTRIC BYPASS N/A 2015   TYMPANIC MEMBRANE REPAIR Left 03/2010   VASECTOMY     WART FULGURATION N/A 05/31/2022   Procedure: FULGURATION ANAL WART;  Surgeon: Sung Amabile, DO;  Location: [facility];  Service: General;  Laterality: N/A;   Social History:  reports that he quit smoking about 28 years ago. His smoking use included cigars. He has never been exposed to tobacco smoke. He has never used smokeless tobacco. He reports that he does not currently use alcohol. He reports that he does not use drugs.  Allergies  Allergen Reactions   Contrast Media [Iodinated Contrast Media] Other (See Comments)    Kidney transplant   Iodine Other (See Comments)    Other reaction(s): Other (See Comments) Kidney transplant   Ibuprofen Other (See Comments)    Due to kidney transplant Due to kidney transplant Due to kidney transplant    Family History  Problem Relation Age of Onset   Heart disease Father    Kidney failure Father    Kidney disease Father    Diabetes Maternal Grandmother    Breast cancer Maternal Grandmother     Valvular heart disease Mother    Liver cancer Paternal Uncle    Liver cancer Paternal Grandmother    Prostate cancer Neg Hx     Prior to Admission medications   Medication Sig Start Date End Date Taking? Authorizing Provider  albuterol (PROVENTIL) (2.5 MG/3ML) 0.083% nebulizer solution Take 3 mLs (2.5 mg total) by nebulization every 6 (six) hours as needed for wheezing or shortness of breath. 11/18/22   [name], MD  albuterol (  VENTOLIN HFA) 108 (90 Base) MCG/ACT inhaler Inhale 2 puffs into the lungs every 6 (six) hours as needed for wheezing or shortness of breath. 11/18/22   [name], MD  apixaban (ELIQUIS) 5 MG TABS tablet Take 1 tablet (5 mg total) by mouth 2 (two) times daily. 10/21/22 10/21/23  Laurey Morale, MD  buPROPion [facility] SR) 150 MG 12 hr tablet Take 1 tablet (150 mg total) by mouth 2 (two) times daily. 06/17/22 06/17/23  [name], MD  cholecalciferol (VITAMIN D3) 25 MCG (1000 UNIT) tablet Take 1,000 Units by mouth at bedtime.    [provider]  colchicine 0.6 MG tablet Take 1 tablet (0.6 mg total) by mouth every other day. 10/15/22     Continuous Glucose Sensor (FREESTYLE LIBRE 3 SENSOR) MISC Use one every 2 weeks. 01/24/23     COVID-19 mRNA vaccine, Pfizer, (COMIRNATY) syringe Inject into the muscle. 12/24/22   Judyann Munson, MD  cyclobenzaprine (FLEXERIL) 5 MG tablet Take by mouth. Patient not taking: Reported on 01/06/2023 07/08/22   [provider]  dapagliflozin propanediol (FARXIGA) 10 MG TABS tablet Take 1 tablet (10 mg total) by mouth daily before breakfast. 10/21/22   Laurey Morale, MD  febuxostat (ULORIC) 40 MG tablet Take 3 tablets (120 mg total) by mouth daily. 10/15/22     fluticasone (FLONASE) 50 MCG/ACT nasal spray Place 2 sprays into both nostrils daily. 08/14/22   [name], MD  gabapentin (NEURONTIN) 300 MG capsule Take 1 capsule (300 mg total) by mouth 2 (two) times daily. 01/10/23   Raj Janus, MD   gabapentin (NEURONTIN) 300 MG capsule Take 1 capsule (300 mg total) by mouth 2 (two) times daily. 01/10/23     HYDROcodone-acetaminophen (NORCO/VICODIN) 5-325 MG tablet Take 1-2 tablets by mouth every 6 (six) hours as needed for moderate pain (pain score 4-6). 01/08/23   Karie Soda, MD  HYDROcodone-acetaminophen (NORCO/VICODIN) 5-325 MG tablet Take 1-2 tablets by mouth every 8 (eight) hours as needed for moderate pain (pain score 4-6) for up to 5 days 01/14/23     hydrocortisone 2.5 % cream Apply topically 3 (three) times daily as needed. 01/21/22   [name], MD  influenza vac split trivalent PF (FLULAVAL) 0.5 ML injection Inject into the muscle. 12/24/22   Judyann Munson, MD  insulin glargine-yfgn (SEMGLEE) 100 UNIT/ML Pen Inject 40 Units into the skin daily. Patient taking differently: Inject 40 Units into the skin at bedtime. 07/04/22     insulin lispro (HUMALOG KWIKPEN) 100 UNIT/ML KwikPen Inject 10-20 Units into the skin 3 (three) times daily. Per sliding scale 05/31/22   Tonna Boehringer, Isami, DO  Insulin Pen Needle (TECHLITE PEN NEEDLES) 31G X 5 MM MISC 4 (four) times daily. 08/21/22   Langston Reusing, PA-C  Insulin Pen Needle (TECHLITE PEN NEEDLES) 32G X 6 MM MISC Use 4 times a day 08/26/22     Insulin Pen Needle (UNIFINE PENTIPS) 31G X 5 MM MISC Use 4 times daily as directed 05/28/21     iron polysaccharides (FERREX 150) 150 MG capsule Take 1 capsule (150 mg total) by mouth 2 (two) times daily. 07/12/22   [name], MD  lidocaine (LIDODERM) 5 % Place 1 patch onto the skin daily.PLACE 1 PATCH ONTO THE SKIN FOR 12 (TWELVE) HOURS. REMOVE & DISCARD PATCH WITHIN 12 HOURS OR AS DIRECTED BY MD Patient taking differently: Place 1 patch onto the skin daily as needed. 04/29/22 04/29/23  [name], MD  lidocaine (XYLOCAINE) 2 % jelly Apply topically as needed. 01/09/23     lidocaine (XYLOCAINE) 5 % ointment Apply 1 Application topically 3 (three) times daily as needed. Apply  pea size amount to area as needed for pain. 05/31/22   Tonna Boehringer, Isami, DO  lidocaine (XYLOCAINE) 5 % ointment Apply a pea size amount to area  3 (three) times daily as needed for pain. 01/09/23     losartan (COZAAR) 25 MG tablet Take 1 tablet (25 mg total) by mouth daily. 10/21/22 04/20/23  Laurey Morale, MD  metolazone (ZAROXOLYN) 2.5 MG tablet Take 1 tablet (2.5 mg total) by mouth once a week. 01/27/23   Laurey Morale, MD  montelukast (SINGULAIR) 10 MG tablet Take 1 tablet (10 mg total) by mouth at bedtime. 01/11/23 01/11/24  Glenford Bayley, NP  montelukast (SINGULAIR) 10 MG tablet Take 1 tablet (10 mg total) by mouth at bedtime. 01/10/23 01/10/24  [name], MD  Multiple Vitamin (MULTIVITAMIN WITH MINERALS) TABS tablet Take 1 tablet by mouth daily.    [provider]  mycophenolate (MYFORTIC) 180 MG EC tablet Take 2 tablets (360 mg total) by mouth 2 (two) times daily. 04/04/22     omeprazole (PRILOSEC) 20 MG capsule Take 1 capsule (20 mg total) by mouth daily. 01/09/23 01/09/24  [name], MD  potassium chloride (KLOR-CON) 10 MEQ tablet Take 6 tablets (60 mEq total) by mouth every morning AND 4 tablets (40 mEq total) every evening. 08/30/22   Laurey Morale, MD  predniSONE (DELTASONE) 5 MG tablet Take 1 tablet (5 mg total) by mouth daily. 03/01/22     rosuvastatin (CRESTOR) 10 MG tablet Take 1 tablet (10 mg total) by mouth daily. Patient taking differently: Take 10 mg by mouth every evening. 09/13/22   Laurey Morale, MD  spironolactone (ALDACTONE) 25 MG tablet Take 0.5 tablets (12.5 mg total) by mouth daily. 12/17/22 03/18/23  Dorthula Nettles, DO  sulfamethoxazole-trimethoprim (BACTRIM) 400-80 MG tablet Take 1 tablet by mouth 3 (three) times a week every Monday, Wednesday, and Friday. 07/10/22     tacrolimus (PROGRAF) 1 MG capsule Take 2 capsules (2 mg total) by mouth 2 times daily. 03/01/22     tamsulosin (FLOMAX) 0.4 MG CAPS capsule Take 1 capsule (0.4 mg total) by  mouth daily. 01/16/23   Carman Ching, PA-C  Testosterone 20.25 MG/ACT (1.62%) GEL Place 2 Pump onto the skin daily. 10/15/22   Michiel Cowboy A, PA-C  tirzepatide [facility]) 2.5 MG/0.5ML Pen Inject 2.5 mg into the skin once a week. 01/16/23     torsemide (DEMADEX) 20 MG tablet Take 4 tablets (80 mg total) by mouth 2 (two) times daily. 01/27/23 01/27/24  Laurey Morale, MD  zinc sulfate 220 (50 Zn) MG capsule Take 220 mg by mouth daily.    [provider]    Physical Exam: Vitals:   02/03/23 1647 02/03/23 1648  BP: (!) 147/93   Pulse: 80   Resp: 18   Temp: 97.8 F (36.6 C)   TempSrc: Oral   SpO2: 98%   Weight:  112 kg  Height:  5\' 10"  (1.778 m)   Physical Exam Vitals and nursing note reviewed.  Constitutional:      General: He is not in acute distress. HENT:     Head: Normocephalic and atraumatic.  Cardiovascular:     Rate and Rhythm: Normal rate and regular rhythm.     Heart sounds: Normal heart sounds.  Pulmonary:     Effort:  Pulmonary effort is normal.     Breath sounds: Normal breath sounds.  Abdominal:     Palpations: Abdomen is soft.     Tenderness: There is no abdominal tenderness.  Musculoskeletal:     Right lower leg: No edema.     Left lower leg: No edema.     Comments: Venous stasis skin changes  Neurological:     Mental Status: Mental status is at baseline.     Labs on Admission: I have personally reviewed following labs and imaging studies  CBC: Recent Labs  Lab 02/03/23 1650  WBC 5.6  NEUTROABS 3.7  HGB 13.7  HCT 43.0  MCV 90.0  PLT 179   Basic Metabolic Panel: Recent Labs  Lab 02/03/23 0849 02/03/23 1650  NA 138 137  K 5.6* 6.0*  CL 104 104  CO2 24 25  GLUCOSE 173* 97  BUN 137* 123*  CREATININE 4.31* 4.30*  CALCIUM 9.0 9.1   GFR: Estimated Creatinine Clearance: 22 mL/min (A) (by C-G formula based on SCr of 4.3 mg/dL (H)). Liver Function Tests: Recent Labs  Lab 02/03/23 1650  AST 24  ALT 15  ALKPHOS 128*   BILITOT 0.5  PROT 8.4*  ALBUMIN 4.2   No results for input(s): "LIPASE", "AMYLASE" in the last 168 hours. No results for input(s): "AMMONIA" in the last 168 hours. Coagulation Profile: Recent Labs  Lab 02/03/23 1650  INR 1.2   Cardiac Enzymes: No results for input(s): "CKTOTAL", "CKMB", "CKMBINDEX", "TROPONINI" in the last 168 hours. BNP (last 3 results) Recent Labs    10/21/22 1214  PROBNP 4,402*   HbA1C: No results for input(s): "HGBA1C" in the last 72 hours. CBG: No results for input(s): "GLUCAP" in the last 168 hours. Lipid Profile: No results for input(s): "CHOL", "HDL", "LDLCALC", "TRIG", "CHOLHDL", "LDLDIRECT" in the last 72 hours. Thyroid Function Tests: No results for input(s): "TSH", "T4TOTAL", "FREET4", "T3FREE", "THYROIDAB" in the last 72 hours. Anemia Panel: No results for input(s): "VITAMINB12", "FOLATE", "FERRITIN", "TIBC", "IRON", "RETICCTPCT" in the last 72 hours. Urine analysis:    Component Value Date/Time   COLORURINE RED (A) 06/21/2022 0848   APPEARANCEUR Clear 10/14/2022 0933   LABSPEC 1.014 06/21/2022 0848   PHURINE  06/21/2022 0848    TEST NOT REPORTED DUE TO COLOR INTERFERENCE OF URINE PIGMENT   GLUCOSEU Trace (A) 10/14/2022 0933   HGBUR (A) 06/21/2022 0848    TEST NOT REPORTED DUE TO COLOR INTERFERENCE OF URINE PIGMENT   BILIRUBINUR Negative 10/14/2022 0933   KETONESUR (A) 06/21/2022 0848    TEST NOT REPORTED DUE TO COLOR INTERFERENCE OF URINE PIGMENT   PROTEINUR Negative 10/14/2022 0933   PROTEINUR (A) 06/21/2022 0848    TEST NOT REPORTED DUE TO COLOR INTERFERENCE OF URINE PIGMENT   NITRITE Negative 10/14/2022 0933   NITRITE (A) 06/21/2022 0848    TEST NOT REPORTED DUE TO COLOR INTERFERENCE OF URINE PIGMENT   LEUKOCYTESUR Negative 10/14/2022 0933   LEUKOCYTESUR (A) 06/21/2022 0848    TEST NOT REPORTED DUE TO COLOR INTERFERENCE OF URINE PIGMENT    Radiological Exams on Admission: CT HEAD WO CONTRAST  Result Date:  02/03/2023 CLINICAL DATA:  Slurred speech x5 days. EXAM: CT HEAD WITHOUT CONTRAST TECHNIQUE: Contiguous axial images were obtained from the base of the skull through the vertex without intravenous contrast. RADIATION DOSE REDUCTION: This exam was performed according to the departmental dose-optimization program which includes automated exposure control, adjustment of the mA and/or kV according to patient size and/or use of iterative reconstruction  technique. COMPARISON:  July 06, 2022 FINDINGS: Brain: There is mild cerebral atrophy with widening of the extra-axial spaces and ventricular dilatation. There are areas of decreased attenuation within the white matter tracts of the supratentorial brain, consistent with microvascular disease changes. Vascular: Marked severity bilateral cavernous carotid artery calcification is noted. Skull: Normal. Negative for fracture or focal lesion. Sinuses/Orbits: There is mild left ethmoid sinus mucosal thickening. Other: None. IMPRESSION: 1. Generalized cerebral atrophy with chronic white matter small vessel ischemic changes. 2. No acute intracranial abnormality. 3. Mild left ethmoid sinus disease. Electronically Signed   By: Aram Candela M.D.   On: 02/03/2023 20:07     Data Reviewed: Relevant notes from primary care and specialist visits, past discharge summaries as available in EHR, including Care Everywhere. Prior diagnostic testing as pertinent to current admission diagnoses Updated medications and problem lists for reconciliation ED course, including vitals, labs, imaging, treatment and response to treatment Triage notes, nursing and pharmacy notes and ED provider's notes Notable results as noted in HPI   Assessment and Plan: Acute focal neurological deficit Possible acute CVA  Possible acute metabolic encephalopathy related to AKI on CKD with hyperkalemia Chronic left facial droop secondary to remote Bell's palsy Patient presents as code stroke with  sudden onset stuttering speech-speech difficulties in the prior days Initial CT negative for intracranial hemorrhage Seen by teleneurology in the ED with low suspicion for stroke (recommendation for stroke and encephalopathy workup) Not a tPA candidate due to Eliquis and low suspicion for stroke Stroke workup labetalol IV or Vasotec IV If BP greater than 220/120  Statins for LDL goal less than 70 Continue Eliquis (antiplatelets not recommended) Telemetry, Ecoh.  Follow-up MR Ryan MRA head and neck Avoid dextrose containing fluids, Maintain euglycemia, euthermia Neuro checks q4 hrs x 24 hrs and then per shift Head of bed 30 degrees Physical therapy/Occupational therapy/Speech therapy if failed dysphagia screen Neurology consult to follow  Diarrhea due to drug Suspect Mounjaro adverse effect Patient started [facility] 2 weeks prior having diarrhea since.  States every time he eats he has a bowel movement Infectious etiology not suspected at this time Consider switching to an alternate agent IV hydration, encourage oral  Acute metabolic encephalopathy Secondary to dehydration from diarrhea and diuretics Will hydrate Monitor and correct hyperkalemia EEG not recommended by neurology-see note  Hyperkalemia Acute kidney injury superimposed on CKD 4 History of renal transplant AKI secondary to ATN from dehydration related to diarrhea and diuretics  Creatinine 4.3 up from baseline of 2.6 and potassium of 6 Hyperkalemia likely secondary to potassium tablets(takes 60 mEq every morning and 40 mEq every afternoon), AKI and losartan Received Lokelma in the ED Hold potassium pills, hold losartan IV hydration and monitor Continue to monitor  Renal transplant recipient, 2012 Long-term immune suppressant therapy Continue Myfortic and tacrolimus  Chronic HFrEF (heart failure with reduced ejection fraction) (HCC) Ischemic cardiomyopathy (EF 40 to 45% 03/2022) Clinically euvolemic to dry On  GDMT with spironolactone, losartan, Farxiga Also on torsemide and metolazone Will hold blood pressure lowering agents for permissive hypertension Monitor for fluid overload in view of IV hydration to correct AKI  Essential hypertension Holding BP lowering meds for permissive hypertension  Obesity, Class III, BMI 40-49.9 (morbid obesity) (HCC) History of gastric bypass  OSA on CPAP Complicating factor to overall prognosis and care CPAP nightly  Permanent atrial fibrillation (HCC) Chronic anticoagulation Continue apixaban Not on rate controlled agents  CAD s/p CABG 2005 Continue rosuvastatin, losartan  Chronic venous insufficiency On  chronic diuretics with torsemide, spironolactone and metolazone Holding diuretics for now Keep legs elevated  Type II diabetes mellitus with renal manifestations (HCC) Continue basal insulin Sliding scale insulin coverage  Depression Continue Wellbutrin  BPH (benign prostatic hyperplasia) Continue Flomax    DVT prophylaxis: Eliquis  Consults: neurology, Dr Iver Nestle  Advance Care Planning:   Code Status: Prior   Family Communication: none  Disposition Plan: Back to previous home environment  Severity of Illness: The appropriate patient status for this patient is INPATIENT. Inpatient status is judged to be reasonable and necessary in order to provide the required intensity of service to ensure the patient's safety. The patient's presenting symptoms, physical exam findings, and initial radiographic and laboratory data in the context of their chronic comorbidities is felt to place them at high risk for further clinical deterioration. Furthermore, it is not anticipated that the patient will be medically stable for discharge from the hospital within 2 midnights of admission.   * I certify that at the point of admission it is my clinical judgment that the patient will require inpatient hospital care spanning beyond 2 midnights from the point of  admission due to high intensity of service, high risk for further deterioration and high frequency of surveillance required.*  Author: Andris Baumann, MD 02/03/2023 9:13 PM  For on call review [email or web address].

## 2023-02-03 NOTE — ED Notes (Signed)
Pt to MRI

## 2023-02-03 NOTE — Assessment & Plan Note (Signed)
-   Continue Wellbutrin 

## 2023-02-03 NOTE — Assessment & Plan Note (Addendum)
Possible acute CVA  Possible acute metabolic encephalopathy related to AKI on CKD with hyperkalemia Chronic left facial droop secondary to remote Bell's palsy Patient presents as code stroke with sudden onset stuttering speech-speech difficulties in the prior days Initial CT negative for intracranial hemorrhage Seen by teleneurology in the ED with low suspicion for stroke (recommendation for stroke and encephalopathy workup) Not a tPA candidate due to Eliquis and low suspicion for stroke Stroke workup labetalol IV or Vasotec IV If BP greater than 220/120  Statins for LDL goal less than 70 Continue Eliquis (antiplatelets not recommended) Telemetry, Ecoh.  Follow-up MR Ryan MRA head and neck Avoid dextrose containing fluids, Maintain euglycemia, euthermia Neuro checks q4 hrs x 24 hrs and then per shift Head of bed 30 degrees Physical therapy/Occupational therapy/Speech therapy if failed dysphagia screen Neurology consult to follow

## 2023-02-03 NOTE — Assessment & Plan Note (Signed)
Improved

## 2023-02-03 NOTE — Progress Notes (Signed)
   02/03/23 1900  Spiritual Encounters  Type of Visit Initial  Care provided to: Pt and family  Referral source Code page  Reason for visit Code  OnCall Visit Yes   Chaplain responded to Code Stroke. Chaplain provided care to family at beside.

## 2023-02-03 NOTE — Assessment & Plan Note (Signed)
-   Continue Flomax 

## 2023-02-03 NOTE — Assessment & Plan Note (Signed)
Continue rosuvastatin.  

## 2023-02-03 NOTE — ED Provider Notes (Addendum)
[facility] Provider Note    Event Date/Time   First MD Initiated Contact with Patient 02/03/23 1805     (approximate)  History   Chief Complaint: Aphasia  HPI  [name]. is a 62 y.o. male with a past medical history of anemia, CHF, atrial fibrillation, CKD, hyperlipidemia, hypertension, presents to the emergency department for acute onset of difficulty speaking.  According to the patient and his wife patient was normal until 3 PM today when he began having a stuttering to his speech.  Patient has a left facial droop however this is chronic due to Bell's palsy times years no change to this.  Wife states the patient was speaking normally until 3:00 today when he began a stutter which has progressively worsened over the past 3 hours.  Initially per triage they had stated symptoms have been ongoing x 5 days however I spoke to the patient in depth he states he got a Mounjaro injection 3 days ago which he believes is what caused his symptoms however he states symptoms did not start until 3 PM today.  Denies any weakness or numbness of any arm or leg.  No headache.  Code stroke activated upon my examination.  Physical Exam   Triage Vital Signs: ED Triage Vitals  Encounter Vitals Group     BP 02/03/23 1647 (!) 147/93     Systolic BP Percentile --      Diastolic BP Percentile --      Pulse Rate 02/03/23 1647 80     Resp 02/03/23 1647 18     Temp 02/03/23 1647 97.8 F (36.6 C)     Temp Source 02/03/23 1647 Oral     SpO2 02/03/23 1647 98 %     Weight 02/03/23 1648 247 lb (112 kg)     Height 02/03/23 1648 5\' 10"  (1.778 m)     Head Circumference --      Peak Flow --      Pain Score 02/03/23 1648 0     Pain Loc --      Pain Education --      Exclude from Growth Chart --     Most recent vital signs: Vitals:   02/03/23 1647  BP: (!) 147/93  Pulse: 80  Resp: 18  Temp: 97.8 F (36.6 C)  SpO2: 98%    General: Awake, no distress.  CV:  Good  peripheral perfusion.  Regular rate and rhythm  Resp:  Normal effort.  Equal breath sounds bilaterally.  Abd:  No distention.  Soft, nontender.  No rebound or guarding. Other:  Lower extremity edema/chronic venous stasis.  Patient has a slightly diminished left grip strength compared to the right.  No pronator drift.  No lower extremity drift.  4+/5 strength in bilateral lower extremities.  Patient has a left facial droop (wife states this is chronic)   ED Results / Procedures / Treatments   EKG  EKG viewed and interpreted by myself shows atrial fibrillation at 67 bpm with a widened QRS, normal axis, normal intervals nonspecific ST changes.  RADIOLOGY  I have reviewed and interpreted the CT head images.  I do not appreciate any obvious bleed on my evaluation.   MEDICATIONS ORDERED IN ED: Medications  sodium chloride flush (NS) 0.9 % injection 3 mL (has no administration in time range)     IMPRESSION / MDM / ASSESSMENT AND PLAN / ED COURSE  I reviewed the triage vital signs and the nursing notes.  Patient's presentation is most consistent with acute presentation with potential threat to life or bodily function.  Patient presents emergency department for speech difficulty starting at 3 PM today.  Initially triage had reported several days of symptoms however clarified when I saw the patient as his symptoms started at 3 PM today prior to that he was speaking normally.  Patient does have chronic atrial fibrillation and is also on Eliquis.  As the patient's symptoms started 3 hours and 20 minutes ago I have activated the code stroke protocols.  He has already received lab work and a CT scan of the head as well as an EKG.  Lab work has resulted showing a reassuring CBC with a normal white blood cell count, chemistry shows chronic renal failure but largely unchanged from historical values creatinine 4.3, BUN elevated to 123 but this is not significantly changed from historical values.   Patient's INR is 1.2 ethanol negative.  Patient's labs have resulted with a chemistry showing a potassium of 6.0 increased from last check of 5.3.  Will dose Lokelma for hyperkalemia and dose 500 cc of normal saline.  Neurology has seen the patient, not a candidate for TN K.  They recommend MRI and admission to the hospital for further workup and treatment.  We will discuss with the hospitalist for admission.  CRITICAL CARE Performed by: Minna Antis   Total critical care time: 30 minutes  Critical care time was exclusive of separately billable procedures and treating other patients.  Critical care was necessary to treat or prevent imminent or life-threatening deterioration.  Critical care was time spent personally by me on the following activities: development of treatment plan with patient and/or surrogate as well as nursing, discussions with consultants, evaluation of patient's response to treatment, examination of patient, obtaining history from patient or surrogate, ordering and performing treatments and interventions, ordering and review of laboratory studies, ordering and review of radiographic studies, pulse oximetry and re-evaluation of patient's condition.   NIH Stroke Scale   Interval: Baseline Time: 7:50 PM Person Administering Scale: Minna Antis  Administer stroke scale items in the order listed. Record performance in each category after each subscale exam. Do not go back and change scores. Follow directions provided for each exam technique. Scores should reflect what the patient does, not what the clinician thinks the patient can do. The clinician should record answers while administering the exam and work quickly. Except where indicated, the patient should not be coached (i.e., repeated requests to patient to make a special effort).   1a  Level of consciousness: 0=alert; keenly responsive  1b. LOC questions:  0=Performs both tasks correctly  1c. LOC commands:  0=Performs both tasks correctly  2.  Best Gaze: 0=normal  3.  Visual: 0=No visual loss  4. Facial Palsy: 2=Partial paralysis (total or near total paralysis of the lower face)  5a.  Motor left arm: 1=Drift, limb holds 90 (or 45) degrees but drifts down before full 10 seconds: does not hit bed  5b.  Motor right arm: 0=No drift, limb holds 90 (or 45) degrees for full 10 seconds  6a. motor left leg: 0=No drift, limb holds 90 (or 45) degrees for full 10 seconds  6b  Motor right leg:  0=No drift, limb holds 90 (or 45) degrees for full 10 seconds  7. Limb Ataxia: 0=Absent  8.  Sensory: 0=Normal; no sensory loss  9. Best Language:  1=Mild to moderate aphasia; some obvious loss of fluency or facility of comprehension without significant  limitation on ideas expressed or form of expression.  10. Dysarthria: 1=Mild to moderate, patient slurs at least some words and at worst, can be understood with some difficulty  11. Extinction and Inattention: 0=No abnormality  12. Distal motor function: 0=Normal   Total:   5     FINAL CLINICAL IMPRESSION(S) / ED DIAGNOSES   Aphasia CVA   Note:  This document was prepared using Dragon voice recognition software and may include unintentional dictation errors.   Minna Antis, MD 02/03/23 1950    Minna Antis, MD 02/03/23 1950    Minna Antis, MD 02/03/23 [phone]

## 2023-02-03 NOTE — Assessment & Plan Note (Deleted)
Chronic anticoagulation Continue apixaban Not on rate controlled agents

## 2023-02-03 NOTE — Assessment & Plan Note (Signed)
Chronic anticoagulation Continue apixaban Not on rate controlled agents

## 2023-02-03 NOTE — ED Notes (Signed)
Code stroke called. Tele neuro present now.

## 2023-02-03 NOTE — ED Notes (Signed)
EDP at [facility] 

## 2023-02-03 NOTE — ED Triage Notes (Signed)
Patient to ED from [facility] for slurred speech x5 days. States it all started after taking a mounjaro shot. Facial droop noted. Equal strength in extremities. Hx of kidney transplant

## 2023-02-04 DIAGNOSIS — E875 Hyperkalemia: Secondary | ICD-10-CM

## 2023-02-04 DIAGNOSIS — E785 Hyperlipidemia, unspecified: Secondary | ICD-10-CM

## 2023-02-04 DIAGNOSIS — G4733 Obstructive sleep apnea (adult) (pediatric): Secondary | ICD-10-CM

## 2023-02-04 DIAGNOSIS — N170 Acute kidney failure with tubular necrosis: Secondary | ICD-10-CM

## 2023-02-04 DIAGNOSIS — K521 Toxic gastroenteritis and colitis: Secondary | ICD-10-CM

## 2023-02-04 DIAGNOSIS — R4701 Aphasia: Secondary | ICD-10-CM

## 2023-02-04 DIAGNOSIS — Z8669 Personal history of other diseases of the nervous system and sense organs: Secondary | ICD-10-CM | POA: Diagnosis not present

## 2023-02-04 DIAGNOSIS — G9341 Metabolic encephalopathy: Secondary | ICD-10-CM | POA: Diagnosis not present

## 2023-02-04 DIAGNOSIS — Z940 Kidney transplant status: Secondary | ICD-10-CM

## 2023-02-04 DIAGNOSIS — I5022 Chronic systolic (congestive) heart failure: Secondary | ICD-10-CM | POA: Diagnosis not present

## 2023-02-04 DIAGNOSIS — N1832 Chronic kidney disease, stage 3b: Secondary | ICD-10-CM | POA: Diagnosis present

## 2023-02-04 DIAGNOSIS — I872 Venous insufficiency (chronic) (peripheral): Secondary | ICD-10-CM

## 2023-02-04 DIAGNOSIS — R29818 Other symptoms and signs involving the nervous system: Secondary | ICD-10-CM

## 2023-02-04 DIAGNOSIS — N179 Acute kidney failure, unspecified: Secondary | ICD-10-CM | POA: Diagnosis present

## 2023-02-04 DIAGNOSIS — I10 Essential (primary) hypertension: Secondary | ICD-10-CM

## 2023-02-04 DIAGNOSIS — N184 Chronic kidney disease, stage 4 (severe): Secondary | ICD-10-CM

## 2023-02-04 DIAGNOSIS — I4821 Permanent atrial fibrillation: Secondary | ICD-10-CM

## 2023-02-04 LAB — BASIC METABOLIC PANEL
Anion gap: 5 (ref 5–15)
Anion gap: 7 (ref 5–15)
BUN: 124 mg/dL — ABNORMAL HIGH (ref 8–23)
BUN: 122 mg/dL — ABNORMAL HIGH (ref 8–23)
CO2: 24 mmol/L (ref 22–32)
CO2: 25 mmol/L (ref 22–32)
Calcium: 8.6 mg/dL — ABNORMAL LOW (ref 8.9–10.3)
Calcium: 8.9 mg/dL (ref 8.9–10.3)
Chloride: 107 mmol/L (ref 98–111)
Chloride: 107 mmol/L (ref 98–111)
Creatinine, Ser: 3.9 mg/dL — ABNORMAL HIGH (ref 0.61–1.24)
Creatinine, Ser: 3.88 mg/dL — ABNORMAL HIGH (ref 0.61–1.24)
GFR, Estimated: 17 mL/min — ABNORMAL LOW (ref >60)
GFR, Estimated: 17 mL/min — ABNORMAL LOW (ref >60)
Glucose, Bld: 178 mg/dL — ABNORMAL HIGH (ref 70–99)
Glucose, Bld: 133 mg/dL — ABNORMAL HIGH (ref 70–99)
Potassium: 5.2 mmol/L — ABNORMAL HIGH (ref 3.5–5.1)
Potassium: 5.2 mmol/L — ABNORMAL HIGH (ref 3.5–5.1)
Sodium: 136 mmol/L (ref 135–145)
Sodium: 139 mmol/L (ref 135–145)

## 2023-02-04 LAB — URINE DRUG SCREEN, QUALITATIVE (ARMC ONLY)
Amphetamines, Ur Screen: NOT DETECTED
Barbiturates, Ur Screen: NOT DETECTED
Benzodiazepine, Ur Scrn: NOT DETECTED
Cannabinoid 50 Ng, Ur ~~LOC~~: NOT DETECTED
Cocaine Metabolite,Ur ~~LOC~~: NOT DETECTED
MDMA (Ecstasy)Ur Screen: NOT DETECTED
Methadone Scn, Ur: NOT DETECTED
Opiate, Ur Screen: NOT DETECTED
Phencyclidine (PCP) Ur S: NOT DETECTED
Tricyclic, Ur Screen: NOT DETECTED

## 2023-02-04 LAB — LIPID PANEL
Cholesterol: 71 mg/dL (ref 0–200)
HDL: 24 mg/dL — ABNORMAL LOW (ref >40)
LDL Cholesterol: 5 mg/dL (ref 0–99)
Total CHOL/HDL Ratio: 3 {ratio}
Triglycerides: 210 mg/dL — ABNORMAL HIGH (ref <150)
VLDL: 42 mg/dL — ABNORMAL HIGH (ref 0–40)

## 2023-02-04 LAB — HEMOGLOBIN A1C
Hgb A1c MFr Bld: 8 % — ABNORMAL HIGH (ref 4.8–5.6)
Mean Plasma Glucose: 182.9 mg/dL

## 2023-02-04 LAB — GLUCOSE, CAPILLARY
Glucose-Capillary: 109 mg/dL — ABNORMAL HIGH (ref 70–99)
Glucose-Capillary: 175 mg/dL — ABNORMAL HIGH (ref 70–99)

## 2023-02-04 LAB — MAGNESIUM: Magnesium: 2.9 mg/dL — ABNORMAL HIGH (ref 1.7–2.4)

## 2023-02-04 LAB — CBG MONITORING, ED
Glucose-Capillary: 98 mg/dL (ref 70–99)
Glucose-Capillary: 147 mg/dL — ABNORMAL HIGH (ref 70–99)

## 2023-02-04 MED ORDER — DEXTROSE 50 % IV SOLN: 1.0000 | Freq: Once | INTRAVENOUS | Status: DC

## 2023-02-04 MED ORDER — INSULIN ASPART 100 UNIT/ML IV SOLN: 10.0000 [IU] | Freq: Once | INTRAVENOUS | Status: DC

## 2023-02-04 MED ORDER — SODIUM CHLORIDE 0.9 % IV SOLN: INTRAVENOUS | Status: DC

## 2023-02-04 MED ORDER — CALCIUM GLUCONATE-NACL 1-0.675 GM/50ML-% IV SOLN
1.0000 g | Freq: Once | INTRAVENOUS | Status: AC
  Administered 2023-02-04: 1000 mg via INTRAVENOUS
  Filled 2023-02-04: qty 50

## 2023-02-04 MED ORDER — SODIUM ZIRCONIUM CYCLOSILICATE 10 G PO PACK
10.0000 g | PACK | Freq: Two times a day (BID) | ORAL | Status: DC
  Administered 2023-02-04 (×2): 10 g via ORAL
  Filled 2023-02-04 (×3): qty 1

## 2023-02-04 MED ORDER — MEDIHONEY WOUND/BURN DRESSING EX PSTE
1.0000 | PASTE | Freq: Every day | CUTANEOUS | Status: DC
  Administered 2023-02-04: 1 via TOPICAL
  Filled 2023-02-04 (×2): qty 44

## 2023-02-04 NOTE — Evaluation (Signed)
Occupational Therapy Evaluation Patient Details Name: [name]. MRN: [id number] DOB: 1960/08/09 Today's Date: 02/04/2023   History of Present Illness 62 y.o. male with PMHx of CAD s/p CABG in 2005, ischemic cardiomyopathy (EF 40 to 45% 03/2022), chronic A. Fib on Eliquis, aortic stenosis, HTN, DM2, ESRD s/p renal transplant in 2012 on chronic immunosuppressants, CKD 4, PAH, OSA on CPAP, gout, left facial droop from remote Bell's palsy, who was sent from the [place] clinic as a code stroke after he presented there with a reported 5-day history of stuttering speech acutely worsened at 3 PM on 02/03/2023 while speaking to his minister on the phone. Loose stool over last 2 weeks after his first shot of Mounjaro for weight loss.  MRI brain negative for stroke.   Clinical Impression   Pt was seen for OT evaluation this date. Prior to hospital admission, pt was living with his wife and reports IND with no AD use. He was driving, going to the grocery store and working on multiple home projects now that he is retired.  Pt presents to acute OT demonstrating no functional decline in ADL performance and/or functional mobility. He denies pain, weakness, numbness, or coordination issues and reports his speech has returned to normal. Pt demo bed mobility with MOD I and extra time. He donned shoes at EOB with IND. STS and functional mobility in the hallway ~120 feet with no AD and IND. Toilet transfer IND. Pt does not appear to require any acute OT services at this time and is in agreement with OT to sign off in house. Do not anticipate the need for follow up OT services upon acute hospital DC.       If plan is discharge home, recommend the following:      Functional Status Assessment  Patient has not had a recent decline in their functional status  Equipment Recommendations  None recommended by OT    Recommendations for Other Services       Precautions / Restrictions  Precautions Precautions: None Restrictions Weight Bearing Restrictions: No      Mobility Bed Mobility Overal bed mobility: Modified Independent             General bed mobility comments: MOD I for supine to sit at EOB    Transfers Overall transfer level: Independent Equipment used: None                      Balance Overall balance assessment: Independent                                         ADL either performed or assessed with clinical judgement   ADL Overall ADL's : Independent                     Lower Body Dressing: Independent Lower Body Dressing Details (indicate cue type and reason): don shoes seated EOB Toilet Transfer: Independent;Regular Toilet           Functional mobility during ADLs: Independent       Vision         Perception         Praxis         Pertinent Vitals/Pain Pain Assessment Pain Assessment: No/denies pain     Extremity/Trunk Assessment Upper Extremity Assessment Upper Extremity Assessment: Overall WFL for tasks assessed   Lower Extremity  Assessment Lower Extremity Assessment: Overall WFL for tasks assessed       Communication Communication Communication: No apparent difficulties   Cognition Arousal: Alert Behavior During Therapy: WFL for tasks assessed/performed Overall Cognitive Status: Within Functional Limits for tasks assessed                                       General Comments       Exercises Other Exercises Other Exercises: Edu on role of OT in acute setting and importance of exercise and walking to promote strength and IND.   Shoulder Instructions      Home Living Family/patient expects to be discharged to:: Private residence Living Arrangements: Spouse/significant other Available Help at Discharge: Family;Available PRN/intermittently Type of Home: House Home Access: Stairs to enter [employer] of Steps: 3-4 Entrance  Stairs-Rails: None Home Layout: One level     Bathroom Shower/Tub: Producer, television/film/video: Standard Bathroom Accessibility: Yes              Prior Functioning/Environment Prior Level of Function : Independent/Modified Independent             Mobility Comments: IND with no AD use, was driving, grocery shopping, building shelves, doing a lot of household projects now that he is retired ADLs Comments: IND with no AD use        OT Problem List:        OT Treatment/Interventions:      OT Goals(Current goals can be found in the care plan section)    OT Frequency:      Co-evaluation              AM-PAC OT "6 Clicks" Daily Activity     Outcome Measure Help from another person eating meals?: None Help from another person taking care of personal grooming?: None Help from another person toileting, which includes using toliet, bedpan, or urinal?: None Help from another person bathing (including washing, rinsing, drying)?: None Help from another person to put on and taking off regular upper body clothing?: None Help from another person to put on and taking off regular lower body clothing?: None 6 Click Score: 24   End of Session Nurse Communication: Mobility status  Activity Tolerance: Patient tolerated treatment well Patient left: in chair;with call bell/phone within reach  OT Visit Diagnosis: Other abnormalities of gait and mobility (R26.89)                Time: 4098-1191 OT Time Calculation (min): 23 min Charges:  OT General Charges $OT Visit: 1 Visit OT Evaluation $OT Eval Low Complexity: 1 Low OT Treatments $Therapeutic Activity: 8-22 mins  Ellamae Lybeck, OTR/L 02/04/23, 10:01 AM  Constance Goltz 02/04/2023, 9:59 AM

## 2023-02-04 NOTE — Progress Notes (Signed)
PT Cancellation Note  Patient Details Name: [name]. MRN: [id number] DOB: 1961/01/07   Cancelled Treatment:    Reason Eval/Treat Not Completed: PT screened, no needs identified, will sign off PT orders received, chart reviewed. Pt received ambulating out of bathroom without AD with pt reporting he's at baseline level of function, reports he does not feel he requires PT services at this time. PT to complete current orders, please re-consult if new needs arise.  Aleda Grana, PT, DPT 02/04/23, 11:23 AM   Sandi Mariscal 02/04/2023, 11:22 AM

## 2023-02-04 NOTE — Hospital Course (Signed)
62 year old man past medical history of CAD, ischemic cardiomyopathy, chronic atrial fibrillation on Eliquis, aortic stenosis, hypertension, type 2 diabetes mellitus, end-stage renal disease s/p renal transplant on chronic immunosuppressive's, chronic kidney disease stage IV, sleep apnea on CPAP, gout.  Chronic left facial droop.  Patient was sent in from the [place] clinic as a code stroke with 5-day history of trouble getting his words out and stuttering.  MRI of the brain did not show any acute infarct, MRA of the head and neck was also negative.  Patient also had acute kidney injury on chronic kidney disease stage IV.  11/12.  Creatinine down to 3.88 today.  Will give gentle IV fluid hydration overnight.  Continue to hold Mounjaro.

## 2023-02-04 NOTE — Assessment & Plan Note (Signed)
LDL 5.  Continue Crestor.

## 2023-02-04 NOTE — Progress Notes (Signed)
SLP Cancellation Note  Patient Details Name: [name]. MRN: [id number] DOB: [birth year]   Cancelled treatment:       Reason Eval/Treat Not Completed: SLP screened, no needs identified, will sign off (chart reviewed; consulted NSG and met w/ pt/Wife in room.)  Pt denied any difficulty swallowing and is currently on a regular diet; tolerates swallowing pills w/ water per NSG. Pt conversed in conversation w/ SLP and NSG staff w/out overt expressive/receptive deficits noted; pt denied any speech-language deficits currently today. Speech clear; fluency WFL. No further skilled ST services indicated as pt appears at his communication baseline. Pt agreed. NSG to reconsult if any change in status while admitted.      Jerilynn Som, MS, CCC-SLP Speech Language Pathologist Rehab Services; [facility] [phone] (ascom) Ismael Treptow 02/04/2023, 1:42 PM

## 2023-02-04 NOTE — ED Notes (Signed)
Report off to cathy rn

## 2023-02-04 NOTE — Assessment & Plan Note (Signed)
-   CPAP at night °

## 2023-02-04 NOTE — Assessment & Plan Note (Signed)
Follow up as outpatient.  

## 2023-02-04 NOTE — Assessment & Plan Note (Signed)
Blood pressure stable ? ?

## 2023-02-04 NOTE — Assessment & Plan Note (Addendum)
Suspect Mounjaro adverse effect Advised to hold [employer]

## 2023-02-04 NOTE — Progress Notes (Signed)
Progress Note   Patient: [name]. WJX:[id number] DOB: 12-26-1960 DOA: 02/03/2023     1 DOS: the patient was seen and examined on 02/04/2023   Brief hospital course: 62 year old man past medical history of CAD, ischemic cardiomyopathy, chronic atrial fibrillation on Eliquis, aortic stenosis, hypertension, type 2 diabetes mellitus, end-stage renal disease s/p renal transplant on chronic immunosuppressive's, chronic kidney disease stage IV, sleep apnea on CPAP, gout.  Chronic left facial droop.  Patient was sent in from the [place] clinic as a code stroke with 5-day history of trouble getting his words out and stuttering.  MRI of the brain did not show any acute infarct, MRA of the head and neck was also negative.  Patient also had acute kidney injury on chronic kidney disease stage IV.  11/12.  Creatinine down to 3.88 today.  Will give gentle IV fluid hydration overnight.  Continue to hold Mounjaro.  Assessment and Plan: * Acute renal failure superimposed on stage 4 chronic kidney disease (HCC) Creatinine peaked at 4.31.  Down to 3.88.  Will give gentle fluids overnight.  Recheck creatinine in the morning.  Hold nephrotoxic medications and water pill currently.  Hyperkalemia Will give Lokelma twice a day and recheck potassium tomorrow.  History of Bell's palsy with chronic left facial droop Follow-up as outpatient  Acute focal neurological deficit Patient with trouble getting his words out and stuttering.  MRI of the brain ruled out stroke.  Can go back on Eliquis.  Continue Crestor.  Diarrhea due to drug Suspect Mounjaro adverse effect Advised to hold Mounjaro  Acute metabolic encephalopathy Improved  Chronic HFrEF (heart failure with reduced ejection fraction) (HCC) Ischemic cardiomyopathy (EF 40 to 45% 03/2022) Gentle IV fluids overnight.  Continue Farxiga, holding Cozaar, metolazone, torsemide and spironolactone currently.  Renal transplant recipient,  2012 Long-term immune suppressant therapy Continue Myfortic and tacrolimus  Hyperlipidemia LDL 5.  Continue Crestor.  Hypertension Blood pressure stable.  Obesity, Class III, BMI 40-49.9 (morbid obesity) (HCC) BMI of 35.44  OSA on CPAP CPAP at night  Permanent atrial fibrillation (HCC) Chronic anticoagulation Continue apixaban Not on rate controlled agents  CAD s/p CABG 2005 Continue rosuvastatin  Chronic venous insufficiency On chronic diuretics with torsemide, spironolactone and metolazone Holding diuretics for now Keep legs elevated  Type II diabetes mellitus with renal manifestations (HCC) Continue basal insulin Sliding scale insulin coverage  Depression Continue Wellbutrin  BPH (benign prostatic hyperplasia) Continue Flomax        Subjective: Patient feeling better than when he came in.  MRI brain and MRA of the head and neck were negative.  Patient not having any trouble with finding his words or getting his words out.  Since starting [facility] he has not been eating as much.  Admitted for code stroke and acute kidney injury on chronic kidney disease  Physical Exam: Vitals:   02/04/23 0400 02/04/23 0500 02/04/23 0730 02/04/23 1100  BP: (!) 112/48 (!) 119/59 (!) 109/48 (!) 110/58  Pulse: (!) 56 (!) 55 (!) 30 (!) 53  Resp: 14 16 16 18   Temp:   98.4 F (36.9 C) 98.6 F (37 C)  TempSrc:   Oral Oral  SpO2: 97% 100% 98% 96%  Weight:      Height:       Physical Exam HENT:     Head: Normocephalic.     Mouth/Throat:     Pharynx: No oropharyngeal exudate.  Eyes:     General: Lids are normal.     Conjunctiva/sclera: Conjunctivae  normal.  Cardiovascular:     Rate and Rhythm: Normal rate and regular rhythm.     Heart sounds: Normal heart sounds, S1 normal and S2 normal.  Pulmonary:     Breath sounds: No decreased breath sounds, wheezing, rhonchi or rales.  Abdominal:     Palpations: Abdomen is soft.     Tenderness: There is no abdominal tenderness.   Musculoskeletal:     Right lower leg: Swelling present.     Left lower leg: Swelling present.  Skin:    General: Skin is warm.     Comments: Chronic lower extremity skin discoloration and scabs.  Neurological:     Mental Status: He is alert and oriented to person, place, and time.     Data Reviewed: Potassium 5.2, creatinine 3.88, LDL 5, hemoglobin 13.7  Disposition: Status is: Observation Will give gentle IV fluid overnight and treat potassium with Lokelma.  Planned Discharge Destination: Home    Time spent: 28 minutes  Author: Alford Highland, MD 02/04/2023 12:56 PM  For on call review [email or web address].

## 2023-02-04 NOTE — Progress Notes (Signed)
PHARMACY - ANTICOAGULATION CONSULT NOTE  Pharmacy Consult for apixaban  Indication: atrial fibrillation  Allergies  Allergen Reactions   Contrast Media [Iodinated Contrast Media] Other (See Comments)    Kidney transplant   Iodine Other (See Comments)    Other reaction(s): Other (See Comments) Kidney transplant   Ibuprofen Other (See Comments)    Due to kidney transplant Due to kidney transplant Due to kidney transplant    Patient Measurements: Height: 5\' 10"  (177.8 cm) Weight: 112 kg (247 lb) IBW/kg (Calculated) : 73 Heparin Dosing Weight:    Vital Signs: Temp: 98 F (36.7 C) (11/12 0237) Temp Source: Oral (11/12 0237) BP: 119/59 (11/12 0500) Pulse Rate: 55 (11/12 0500)  Labs: Recent Labs    02/03/23 1650 02/03/23 2220 02/04/23 0142 02/04/23 0401  HGB 13.7  --   --   --   HCT 43.0  --   --   --   PLT 179  --   --   --   APTT 37*  --   --   --   LABPROT 15.6*  --   --   --   INR 1.2  --   --   --   CREATININE 4.30* 4.14* 3.90* 3.88*    Estimated Creatinine Clearance: 24.4 mL/min (A) (by C-G formula based on SCr of 3.88 mg/dL (H)).   Medical History: Past Medical History:  Diagnosis Date   Anal condyloma    Anemia    Aortic atherosclerosis (HCC)    Aortic stenosis    a.) TTE 10/2021: Mod AS. AVA 1.06cm^2 (VTI). Mean grad ; b.) TTE 04/02/2022: no AV stenosis   Asthma, persistent controlled 02/25/2013   Attention or concentration deficit 04/26/2021   BPH with obstruction/lower urinary tract symptoms 09/25/2014   CAD (coronary artery disease) 2005   a.) LHC 1996 -> HG stenosis mLAD -> PTCA/PCI with stent x 1 (unk type) -> complicated by CFA pseudoanurysm (required surg);  b.) LHC 08/10/2002  -> 95% LAD, 70% ISR LAD, 80% pOM, 70% mRCA --> CVTS consult; c.) 4v CABG 08/13/2002; c.) 03/2018 MV: Small, fixed inferoapical and apical sep defect. No isc. EF 47% (60-65% by 05/2018 TTE); d.) 02/2021 MV: small Apical inf fixed defect. No isc -> low risk.    Cardiomegaly    Cellulitis and abscess of left leg 07/22/2020   Chronic atrial fibrillation    a.) CHA2DS2VASc = 4 (CHF, HTN, vascular disease history, T2DM);  b.) rate/rhythm maintained without pharmacological intervention; chronically anticoagulated with apixaban   Chronic combined systolic and diastolic CHF (congestive heart failure)    a.) TTE 7/18 : EF 45-50%; b.) TTE 05/2018 : EF 60-65%, RVSP 74.8; c.) TTE 12/2018: EF 50-55%. Sev dil LA; d.) TTE 04/2019: EF 45-50%; e.) TTE 07/2021: EF 40-45%, G3DD; f.) TTE 10/2021: EF 40-45%, mild conc LVH, septal-lat dyssynchrony (LBBB), mildly red RVSF, mild-mod dil LA, mildly dil RA, mild-mod MS, mod AS; g.) TTE 04/02/2022: EF 40-45%, glob HK, LVH, red RVSF, RVE, sev LAE, mild-mod RAE, mild MR   Chronic venous stasis dermatitis of both lower extremities    Cytomegaloviral disease 2017   Diverticulosis of colon 04/22/2013   Dyspnea    Elevated RIGHT hemidiaphragm    Erectile dysfunction    a.) on topical TRT + Trimix (alprostadil/papaverine/phentolamine) injections   ESRD (end stage renal disease) (HCC)    a.) dialysis dependent 2004-2012; b.) s/p cadaveric RIGHT renal transplant 09/13/2010   Essential hypertension 12/17/2010   GERD (gastroesophageal reflux disease)  Gout    History of 2019 novel coronavirus disease (COVID-19) 06/30/2019   History of bilateral cataract extraction 10/2017   History of renal transplant 09/13/2010   a.) s/p cadaveric donor transplant 09/13/2010   Hx of bilateral cataract extraction 10/2017   Hyperlipidemia    Hypogonadism in male 09/25/2014   Ischemic cardiomyopathy    Left cephalic vein thrombosis 06/2019   Long term current use of anticoagulant    a.) apixaban   Long term current use of immunosuppressive drug    a.) mycophenolate + prednisone + tacrolimus   Major depressive disorder 10/04/2013   Mitral stenosis 11/07/2021   a.) TTE 11/07/2021: severe MAC, mild-mod MS (MPG 6 mmHg); b.) TTE 04/02/2022: no MV  stenosis   Murmur    Obesity hypoventilation syndrome 03/31/2012   OSA treated with BiPAP 09/29/2012   PAH (pulmonary artery hypertension)    a. 04/2019 RHC: RA 19, RV 80/20, PA 78/31 (51), PCWP 25, CO/CI 7.69/3.26. PVR 3.25 --> Sev PAH, likely primarily PV HTN; b.) RHC 04/04/2022: mRA 13, mPA 40, mPCWP 20, PA sat 59, AO sat 92, CO 7.63, CI 3.26, PVR 2.6 --> mod portal venous HTN   Pancreatitis    S/P CABG x 4 08/13/2002   a.) LIMA-LAD, LRA-OM, SVG-D1, SVG-dRCA   S/P gastric bypass    Synovitis of finger 06/11/2019   Trigger finger, unspecified little finger 12/23/2018   Type 2 diabetes mellitus with hyperglycemia, with long-term current use of insulin 09/09/2014   a.) has FreeStyle Libre CGM   Ulcer    Left shin goes to wound care center at [facility]   Wears glasses    Wears hearing aid in both ears     Medications:  (Not in a hospital admission)  Scheduled:    stroke: early stages of recovery book   Does not apply Once    stroke: early stages of recovery book   Does not apply Once   apixaban  5 mg Oral BID   buPROPion  150 mg Oral BID   colchicine  0.6 mg Oral QODAY   dapagliflozin propanediol  10 mg Oral QAC breakfast   gabapentin  300 mg Oral BID   insulin aspart  0-15 Units Subcutaneous TID WC   insulin aspart  0-5 Units Subcutaneous QHS   insulin glargine-yfgn  40 Units Subcutaneous QHS   mycophenolate  360 mg Oral BID   pantoprazole  40 mg Oral Daily   rosuvastatin  10 mg Oral QPM   sodium zirconium cyclosilicate  10 g Oral BID   tacrolimus  2 mg Oral BID   tamsulosin  0.4 mg Oral Daily   Infusions:   Assessment: 62 yo M admitted for possible CVA (now ruled out), AKI on CKD4. Consult for apixaban for Afib. Patient on apixaban 5 mg bid PTA - last dose taken 02/03/23 0900 per Med Rec.  62 yo, 112 kg, Scr 3.88 Hgb 11.7  plt 179  Goal of Therapy:  Monitor platelets by anticoagulation protocol: Yes   Plan:  Will continue patients current dose of apixaban 5  mg po BID for afib  Bari Mantis PharmD Clinical Pharmacist 02/04/2023

## 2023-02-04 NOTE — ED Notes (Signed)
Provided fan, fresh water in a pitcher, NAD, no other needs stated at this time

## 2023-02-04 NOTE — Progress Notes (Signed)
NEUROLOGY CONSULT FOLLOW UP NOTE   Date of service: February 04, 2023 Patient Name: [name]. MRN:  [id number] DOB:  1960-07-11  Brief HPI  [name]. is a 62 y.o. male  has a past medical history of Anal condyloma, Anemia, Aortic atherosclerosis (HCC), Aortic stenosis, Asthma, persistent controlled (02/25/2013), Attention or concentration deficit (04/26/2021), BPH with obstruction/lower urinary tract symptoms (09/25/2014), CAD (coronary artery disease) (2005), Cardiomegaly, Cellulitis and abscess of left leg (07/22/2020), Chronic atrial fibrillation, Chronic combined systolic and diastolic CHF (congestive heart failure), Chronic venous stasis dermatitis of both lower extremities, Cytomegaloviral disease (2017), Diverticulosis of colon (04/22/2013), Dyspnea, Elevated RIGHT hemidiaphragm, Erectile dysfunction, ESRD (end stage renal disease) (HCC), Essential hypertension (12/17/2010), GERD (gastroesophageal reflux disease), Gout, History of 2019 novel coronavirus disease (COVID-19) (06/30/2019), History of bilateral cataract extraction (10/2017), History of renal transplant (09/13/2010), bilateral cataract extraction (10/2017), Hyperlipidemia, Hypogonadism in male (09/25/2014), Ischemic cardiomyopathy, Left cephalic vein thrombosis (06/2019), Long term current use of anticoagulant, Long term current use of immunosuppressive drug, Major depressive disorder (10/04/2013), Mitral stenosis (11/07/2021), Murmur, Obesity hypoventilation syndrome (03/31/2012), OSA treated with BiPAP (09/29/2012), PAH (pulmonary artery hypertension), Pancreatitis, S/P CABG x 4 (08/13/2002), S/P gastric bypass, Synovitis of finger (06/11/2019), Trigger finger, unspecified little finger (12/23/2018), Type 2 diabetes mellitus with hyperglycemia, with long-term current use of insulin (09/09/2014), Ulcer, Wears glasses, and Wears hearing aid in both ears. who presented with  ***   Interval Hx/subjective    *** Current Outpatient Medications  Medication Instructions   albuterol (PROVENTIL) 2.5 mg, Nebulization, Every 6 hours PRN   albuterol (VENTOLIN HFA) 108 (90 Base) MCG/ACT inhaler 2 puffs, Inhalation, Every 6 hours PRN   buPROPion (WELLBUTRIN SR) 150 mg, Oral, 2 times daily   cholecalciferol (VITAMIN D3) 1,000 Units, Oral, Daily at bedtime   colchicine 0.6 mg, Oral, Every other day   Continuous Glucose Sensor (FREESTYLE LIBRE 3 SENSOR) MISC Use one every 2 weeks.   COVID-19 mRNA vaccine, Pfizer, (COMIRNATY) syringe Intramuscular   cyclobenzaprine (FLEXERIL) 5 MG tablet Take by mouth.   Eliquis 5 mg, Oral, 2 times daily   Farxiga 10 mg, Oral, Daily before breakfast   febuxostat (ULORIC) 120 mg, Oral, Daily   Ferrex 150 150 mg, Oral, 2 times daily   fluticasone (FLONASE) 50 MCG/ACT nasal spray 2 sprays, Each Nare, Daily   gabapentin (NEURONTIN) 300 mg, Oral, 2 times daily   gabapentin (NEURONTIN) 300 mg, Oral, 2 times daily   HYDROcodone-acetaminophen (NORCO/VICODIN) 5-325 MG tablet 1-2 tablets, Oral, Every 6 hours PRN   HYDROcodone-acetaminophen (NORCO/VICODIN) 5-325 MG tablet Take 1-2 tablets by mouth every 8 (eight) hours as needed for moderate pain (pain score 4-6) for up to 5 days   hydrocortisone 2.5 % cream Topical, 3 times daily PRN   influenza vac split trivalent PF (FLULAVAL) 0.5 ML injection Intramuscular   insulin lispro (HUMALOG KWIKPEN) 10-20 Units, Subcutaneous, 3 times daily, Per sliding scale    Insulin Pen Needle (TECHLITE PEN NEEDLES) 31G X 5 MM MISC 4 times daily   Insulin Pen Needle (TECHLITE PEN NEEDLES) 32G X 6 MM MISC Use 4 times a day   Insulin Pen Needle (UNIFINE PENTIPS) 31G X 5 MM MISC Use 4 times daily as directed   lidocaine (LIDODERM) 5 % Place 1 patch onto the skin daily.PLACE 1 PATCH ONTO THE SKIN FOR 12 (TWELVE) HOURS. REMOVE & DISCARD PATCH WITHIN 12 HOURS OR AS DIRECTED BY MD   lidocaine (XYLOCAINE) 2 % jelly Topical, As needed  lidocaine  (XYLOCAINE) 5 % ointment Apply 1 Application topically 3 (three) times daily as needed. Apply pea size amount to area as needed for pain.   lidocaine (XYLOCAINE) 5 % ointment Apply a pea size amount to area  3 (three) times daily as needed for pain.   losartan (COZAAR) 25 mg, Oral, Daily   metolazone (ZAROXOLYN) 2.5 mg, Oral, Weekly   montelukast (SINGULAIR) 10 mg, Oral, Daily at bedtime   montelukast (SINGULAIR) 10 mg, Oral, Daily at bedtime   Mounjaro 2.5 mg, Subcutaneous, Weekly   Multiple Vitamin (MULTIVITAMIN WITH MINERALS) TABS tablet 1 tablet, Oral, Daily   mycophenolate (MYFORTIC) 360 mg, Oral, 2 times daily   omeprazole (PRILOSEC) 20 mg, Oral, Daily   potassium chloride (KLOR-CON) 10 MEQ tablet Take 6 tablets (60 mEq total) by mouth every morning AND 4 tablets (40 mEq total) every evening.   predniSONE (DELTASONE) 5 mg, Oral, Daily   rosuvastatin (CRESTOR) 10 mg, Oral, Daily   Semglee (yfgn) 40 Units, Subcutaneous, Daily   spironolactone (ALDACTONE) 12.5 mg, Oral, Daily   sulfamethoxazole-trimethoprim (BACTRIM) 400-80 MG tablet Take 1 tablet by mouth 3 (three) times a week every Monday, Wednesday, and Friday.   tacrolimus (PROGRAF) 1 MG capsule Take 2 capsules (2 mg total) by mouth 2 times daily.   tamsulosin (FLOMAX) 0.4 mg, Oral, Daily   Testosterone 20.25 MG/ACT (1.62%) GEL 2 Pump, Transdermal, Daily   torsemide (DEMADEX) 80 mg, Oral, 2 times daily   zinc sulfate (50mg  elemental zinc) 220 mg, Oral, Daily    Vitals   Vitals:   02/04/23 0300 02/04/23 0400 02/04/23 0500 02/04/23 0730  BP: 108/82 (!) 112/48 (!) 119/59 (!) 109/48  Pulse: 65 (!) 56 (!) 55 (!) 30  Resp: 20 14 16 16   Temp:    98.4 F (36.9 C)  TempSrc:    Oral  SpO2: 96% 97% 100% 98%  Weight:      Height:         Body mass index is 35.44 kg/m.  Physical Exam   Constitutional: Appears well-developed and well-nourished. *** Psych: Affect appropriate to situation. *** Eyes: No scleral injection.  *** HENT: No OP obstrucion. *** Head: Normocephalic. *** Cardiovascular: Normal rate and regular rhythm. *** Respiratory: Effort normal, non-labored breathing. *** GI: Soft.  No distension. There is no tenderness. *** Skin: WDI. ***  Neurologic Examination   ***  Labs and Diagnostic Imaging    Basic Metabolic Panel: Recent Labs  Lab 02/03/23 0849 02/03/23 1650 02/03/23 2220 02/04/23 0142 02/04/23 0401  NA 138 137 136 136 139  K 5.6* 6.0* 5.4* 5.2* 5.2*  CL 104 104 106 107 107  CO2 24 25 23 24 25   GLUCOSE 173* 97 154* 178* 133*  BUN 137* 123* 131* 124* 122*  CREATININE 4.31* 4.30* 4.14* 3.90* 3.88*  CALCIUM 9.0 9.1 8.9 8.6* 8.9  MG  --   --   --   --  2.9*    CBC: Recent Labs  Lab 02/03/23 1650  WBC 5.6  NEUTROABS 3.7  HGB 13.7  HCT 43.0  MCV 90.0  PLT 179      Lipid panel Lab Results  Component Value Date   CHOL 71 02/04/2023   HDL 24 (L) 02/04/2023   LDLCALC 5 02/04/2023   LDLDIRECT 41.9 04/20/2012   TRIG 210 (H) 02/04/2023   CHOLHDL 3.0 02/04/2023     HgbA1c:  Lab Results  Component Value Date   HGBA1C 8.0 (H) 02/04/2023   Urine Drug Screen:  Component Value Date/Time   LABOPIA NONE DETECTED 02/03/2023 2334   COCAINSCRNUR NONE DETECTED 02/03/2023 2334   LABBENZ NONE DETECTED 02/03/2023 2334   AMPHETMU NONE DETECTED 02/03/2023 2334   THCU NONE DETECTED 02/03/2023 2334   LABBARB NONE DETECTED 02/03/2023 2334    Alcohol Level     Component Value Date/Time   ETH <10 02/03/2023 1650   INR  Lab Results  Component Value Date   INR 1.2 02/03/2023   APTT  Lab Results  Component Value Date   APTT 37 (H) 02/03/2023   AED levels: No results found for: "PHENYTOIN", "ZONISAMIDE", "LAMOTRIGINE", "LEVETIRACETA"   MR Angio head without contrast and MRA neck without contrast MRA HEAD: Normal intracranial MRA. MRA NECK: Normal MRA of the neck.  MRI Brain(Personally reviewed): MRI HEAD: 1. No acute intracranial abnormality. 2. Mild  age-related cerebral atrophy with mild to moderate chronic microvascular ischemic disease.      Nursing note:  Pt continuously desatted to 86% on RA while sleeping, placed on 2L [place], sats upped to 98%. Informed RN caring for Pt        Recommendations: Resume home CPAP Consider checking tacrolimus level  rEEG:  ***  Impression   Carl Aayan Schroepfer. is a 62 y.o. male ***  Recommendations  -LDL is meeting goal at 5 -U1L is above goal at 8% -UDS negative -Speech consult pending -Okay to continue Eliquis given MRI is negative for stroke -Consider checking tacrolimus level and holding gabapentin, review meds for other potential dose adjustments needed in the setting of acute kidney failure *** -Neurology will sign off at this time, but please do not hesitate to reach out if additional questions or concerns arise, appreciate management of comorbidities per primary team  ______________________________________________________________________   Thank you for the opportunity to take part in the care of this patient. If you have any further questions, please contact the neurology consultation team on call. Updated oncall schedule is listed on AMION.  Signed,  Torie Towle L Latessa Tillis

## 2023-02-04 NOTE — ED Notes (Signed)
Pt continuously desatted to 86% on RA while sleeping, placed on 2L [place], sats upped to 98%. Informed RN caring for Pt

## 2023-02-04 NOTE — Assessment & Plan Note (Signed)
Long-term immune suppressant therapy Continue Myfortic and tacrolimus

## 2023-02-04 NOTE — Assessment & Plan Note (Signed)
Creatinine peaked at 4.31.  Down to 3.88.  Will give gentle fluids overnight.  Recheck creatinine in the morning.  Hold nephrotoxic medications and water pill currently.

## 2023-02-04 NOTE — Consult Note (Signed)
WOC Nurse Consult Note: Reason for Consult: Consult requested for right leg wounds.  Performed remotely after review of progress notes and photo in the EMR. Right anterior calf with 2 full thickness wounds, one is brown and dry scabbed, other is dark red and dry.  Dressing procedure/placement/frequency: Topical treatment orders provided for bedside nurses to perform as follows to assist with removal of nonviable tissue: (Interchangeable with TheraHoney) Apply Medical honey to right calf wounds Q day and cover with foam dressing.  Change foam dressing Q 3 days or PRN soiling Please re-consult if further assistance is needed.  Thank-you,  Cammie Mcgee MSN, RN, CWOCN, [place], CNS [phone]

## 2023-02-05 ENCOUNTER — Other Ambulatory Visit: Payer: Self-pay

## 2023-02-05 ENCOUNTER — Other Ambulatory Visit: Payer: Commercial Managed Care - PPO

## 2023-02-05 DIAGNOSIS — N179 Acute kidney failure, unspecified: Secondary | ICD-10-CM | POA: Diagnosis not present

## 2023-02-05 DIAGNOSIS — N184 Chronic kidney disease, stage 4 (severe): Secondary | ICD-10-CM | POA: Diagnosis not present

## 2023-02-05 DIAGNOSIS — G9341 Metabolic encephalopathy: Secondary | ICD-10-CM | POA: Diagnosis not present

## 2023-02-05 DIAGNOSIS — R29818 Other symptoms and signs involving the nervous system: Secondary | ICD-10-CM | POA: Diagnosis not present

## 2023-02-05 DIAGNOSIS — D75838 Other thrombocytosis: Secondary | ICD-10-CM | POA: Insufficient documentation

## 2023-02-05 DIAGNOSIS — N171 Acute kidney failure with acute cortical necrosis: Secondary | ICD-10-CM | POA: Diagnosis not present

## 2023-02-05 LAB — BASIC METABOLIC PANEL
Anion gap: 10 (ref 5–15)
BUN: 104 mg/dL — ABNORMAL HIGH (ref 8–23)
CO2: 25 mmol/L (ref 22–32)
Calcium: 9 mg/dL (ref 8.9–10.3)
Chloride: 105 mmol/L (ref 98–111)
Creatinine, Ser: 2.98 mg/dL — ABNORMAL HIGH (ref 0.61–1.24)
GFR, Estimated: 23 mL/min — ABNORMAL LOW (ref >60)
Glucose, Bld: 95 mg/dL (ref 70–99)
Potassium: 4.6 mmol/L (ref 3.5–5.1)
Sodium: 140 mmol/L (ref 135–145)

## 2023-02-05 LAB — CBC
HCT: 39 % (ref 39.0–52.0)
Hemoglobin: 12.5 g/dL — ABNORMAL LOW (ref 13.0–17.0)
MCH: 28.2 pg (ref 26.0–34.0)
MCHC: 32.1 g/dL (ref 30.0–36.0)
MCV: 88 fL (ref 80.0–100.0)
Platelets: 146 10*3/uL — ABNORMAL LOW (ref 150–400)
RBC: 4.43 MIL/uL (ref 4.22–5.81)
RDW: 13.3 % (ref 11.5–15.5)
WBC: 4.1 10*3/uL (ref 4.0–10.5)
nRBC: 0 % (ref 0.0–0.2)

## 2023-02-05 LAB — MAGNESIUM: Magnesium: 2.7 mg/dL — ABNORMAL HIGH (ref 1.7–2.4)

## 2023-02-05 LAB — GLUCOSE, CAPILLARY: Glucose-Capillary: 186 mg/dL — ABNORMAL HIGH (ref 70–99)

## 2023-02-05 NOTE — Plan of Care (Signed)
  Problem: Education: Goal: Knowledge of disease or condition will improve Outcome: Progressing Goal: Knowledge of secondary prevention will improve (MUST DOCUMENT ALL) Outcome: Progressing Goal: Knowledge of patient specific risk factors will improve Loraine Leriche N/A or DELETE if not current risk factor) Outcome: Progressing   Problem: Ischemic Stroke/TIA Tissue Perfusion: Goal: Complications of ischemic stroke/TIA will be minimized Outcome: Progressing   Problem: Coping: Goal: Will verbalize positive feelings about self Outcome: Progressing Goal: Will identify appropriate support needs Outcome: Progressing   Problem: Health Behavior/Discharge Planning: Goal: Ability to manage health-related needs will improve Outcome: Progressing Goal: Goals will be collaboratively established with patient/family Outcome: Progressing   Problem: Self-Care: Goal: Ability to participate in self-care as condition permits will improve Outcome: Progressing Goal: Verbalization of feelings and concerns over difficulty with self-care will improve Outcome: Progressing Goal: Ability to communicate needs accurately will improve Outcome: Progressing   Problem: Nutrition: Goal: Risk of aspiration will decrease Outcome: Progressing Goal: Dietary intake will improve Outcome: Progressing   Problem: Education: Goal: Ability to describe self-care measures that may prevent or decrease complications (Diabetes Survival Skills Education) will improve Outcome: Progressing Goal: Individualized Educational Video(s) Outcome: Progressing   Problem: Coping: Goal: Ability to adjust to condition or change in health will improve Outcome: Progressing   Problem: Fluid Volume: Goal: Ability to maintain a balanced intake and output will improve Outcome: Progressing   Problem: Health Behavior/Discharge Planning: Goal: Ability to identify and utilize available resources and services will improve Outcome:  Progressing Goal: Ability to manage health-related needs will improve Outcome: Progressing   Problem: Metabolic: Goal: Ability to maintain appropriate glucose levels will improve Outcome: Progressing   Problem: Nutritional: Goal: Maintenance of adequate nutrition will improve Outcome: Progressing Goal: Progress toward achieving an optimal weight will improve Outcome: Progressing   Problem: Skin Integrity: Goal: Risk for impaired skin integrity will decrease Outcome: Progressing   Problem: Tissue Perfusion: Goal: Adequacy of tissue perfusion will improve Outcome: Progressing   Problem: Education: Goal: Knowledge of General Education information will improve Description: Including pain rating scale, medication(s)/side effects and non-pharmacologic comfort measures Outcome: Progressing   Problem: Health Behavior/Discharge Planning: Goal: Ability to manage health-related needs will improve Outcome: Progressing   Problem: Clinical Measurements: Goal: Ability to maintain clinical measurements within normal limits will improve Outcome: Progressing Goal: Will remain free from infection Outcome: Progressing Goal: Diagnostic test results will improve Outcome: Progressing Goal: Respiratory complications will improve Outcome: Progressing Goal: Cardiovascular complication will be avoided Outcome: Progressing   Problem: Activity: Goal: Risk for activity intolerance will decrease Outcome: Progressing   Problem: Nutrition: Goal: Adequate nutrition will be maintained Outcome: Progressing   Problem: Coping: Goal: Level of anxiety will decrease Outcome: Progressing   Problem: Elimination: Goal: Will not experience complications related to bowel motility Outcome: Progressing Goal: Will not experience complications related to urinary retention Outcome: Progressing   Problem: Pain Management: Goal: General experience of comfort will improve Outcome: Progressing   Problem:  Safety: Goal: Ability to remain free from injury will improve Outcome: Progressing   Problem: Skin Integrity: Goal: Risk for impaired skin integrity will decrease Outcome: Progressing

## 2023-02-05 NOTE — Discharge Summary (Signed)
Physician Discharge Summary   Patient: [name]. MRN: [id number] DOB: 12-23-1960  Admit date:     02/03/2023  Discharge date: 02/05/23  Discharge Physician: Marrion Coy   PCP: [name], MD   Recommendations at discharge:   Follow-up with PCP in 1 week. Hold off Bactrim and ARB and diuretics, may restart at next office visit. Check a BMP at the next office visit.  Discharge Diagnoses: Principal Problem:   Acute renal failure superimposed on stage 4 chronic kidney disease (HCC) Active Problems:   Hyperkalemia   Acute focal neurological deficit   History of Bell's palsy with chronic left facial droop   Chronic anticoagulation   Acute metabolic encephalopathy   Diarrhea due to drug   Chronic HFrEF (heart failure with reduced ejection fraction) (HCC)   Hypertension   Hyperlipidemia   Renal transplant recipient, 2012   PAH (pulmonary artery hypertension) (HCC)   Ischemic cardiomyopathy   CKD (chronic kidney disease) stage 4, GFR 15-29 ml/min (HCC)   Long-term use of immunosuppressant medication   Essential hypertension   OSA on CPAP   Obesity, Class III, BMI 40-49.9 (morbid obesity) (HCC)   H/O gastric bypass   CAD s/p CABG 2005   Permanent atrial fibrillation (HCC)   Chronic venous insufficiency   Type II diabetes mellitus with renal manifestations (HCC)   Depression   BPH (benign prostatic hyperplasia)   Acute-on-chronic kidney injury (HCC) Mild thrombocytopenia.  Resolved Problems:   * No resolved hospital problems. *  Hospital Course: 62 year old man past medical history of CAD, ischemic cardiomyopathy, chronic atrial fibrillation on Eliquis, aortic stenosis, hypertension, type 2 diabetes mellitus, end-stage renal disease s/p renal transplant on chronic immunosuppressive's, chronic kidney disease stage IV, sleep apnea on CPAP, gout.  Chronic left facial droop.  Patient was sent in from the [place] clinic as a code stroke with 5-day history of  trouble getting his words out and stuttering.  MRI of the brain did not show any acute infarct, MRA of the head and neck was also negative.  Patient also had acute kidney injury on chronic kidney disease stage IV.  11/12.  Creatinine down to 3.88 today.  Will give gentle IV fluid hydration overnight.  Continue to hold Mounjaro. 11/13.  Creatinine further dropped down to 2.98.  Medically stable for discharge. Assessment and Plan: * Acute renal failure superimposed on stage 4 chronic kidney disease (HCC) Creatinine dropped down to 2.98 today after giving fluids. Medically stable for discharge.  At this point, I will continue hold off Bactrim, diuretics and ARB.  May restart at the next office visit.  Hyperkalemia Potassium has normalized.  Continue hold ARB.  Recheck a BMP at the next office visit.  History of Bell's palsy with chronic left facial droop Follow-up as outpatient  Acute focal neurological deficit Patient with trouble getting his words out and stuttering.  MRI of the brain ruled out stroke.  Can go back on Eliquis.  Continue Crestor.  Diarrhea due to drug Suspect Mounjaro adverse effect Advised to hold Mounjaro  Acute metabolic encephalopathy Improved  Chronic HFrEF (heart failure with reduced ejection fraction) (HCC) Ischemic cardiomyopathy (EF 40 to 45% 03/2022) Gentle IV fluids overnight.  Continue Farxiga, holding Cozaar, metolazone, torsemide and spironolactone until seen by PCP as outpatient.  Renal transplant recipient, 2012 Long-term immune suppressant therapy Continue Myfortic and tacrolimus  Hyperlipidemia LDL 5.  Continue Crestor.  Hypertension Blood pressure stable.  Obesity, Class III, BMI 40-49.9 (morbid obesity) (HCC) BMI of  35.44 with comorbidities.  OSA on CPAP CPAP at night  Permanent atrial fibrillation (HCC) Chronic anticoagulation Continue apixaban Not on rate controlled agents  CAD s/p CABG 2005 Continue rosuvastatin  Chronic venous  insufficiency Venous stasis ulcer. On chronic diuretics with torsemide, spironolactone and metolazone Holding diuretics for now Keep legs elevated  Type II diabetes mellitus with renal manifestations (HCC) Uncontrolled type 2 diabetes with hyperglycemia. A1c 8.0. Resume home regimen.  Follow-up with PCP as outpatient to adjust meds  Depression Continue Wellbutrin  BPH (benign prostatic hyperplasia) Continue Flomax        Consultants: None Procedures performed: None  Disposition: Home Diet recommendation:  Discharge Diet Orders (From admission, onward)     Start     Ordered   02/05/23 0000  Diet - low sodium heart healthy        02/05/23 0950           Cardiac diet DISCHARGE MEDICATION: Allergies as of 02/05/2023       Reactions   Contrast Media [iodinated Contrast Media] Other (See Comments)   Kidney transplant   Iodine Other (See Comments)   Other reaction(s): Other (See Comments) Kidney transplant   Nsaids Other (See Comments)   Kidney transplant   Ibuprofen Other (See Comments)   Due to kidney transplant Due to kidney transplant Due to kidney transplant        Medication List     STOP taking these medications    Comirnaty syringe Generic drug: COVID-19 mRNA vaccine (Pfizer)   cyclobenzaprine 5 MG tablet Commonly known as: FLEXERIL   Flulaval 0.5 ML injection Generic drug: influenza vac split trivalent PF   lidocaine 2 % jelly Commonly known as: XYLOCAINE   losartan 25 MG tablet Commonly known as: COZAAR   metolazone 2.5 MG tablet Commonly known as: ZAROXOLYN   potassium chloride 10 MEQ tablet Commonly known as: KLOR-CON   spironolactone 25 MG tablet Commonly known as: ALDACTONE   sulfamethoxazole-trimethoprim 400-80 MG tablet Commonly known as: BACTRIM   torsemide 20 MG tablet Commonly known as: DEMADEX       TAKE these medications    albuterol (2.5 MG/3ML) 0.083% nebulizer solution Commonly known as:  PROVENTIL Inhale one vial (3 ml) via nebulizer every 6 hours as needed for wheezing or shortness of breath (Take 3 mLs (2.5 mg total) by nebulization every 6 (six) hours as needed for wheezing or shortness of breath.)   albuterol 108 (90 Base) MCG/ACT inhaler Commonly known as: VENTOLIN HFA Inhale 2 puffs into the lungs every 6 (six) hours as needed for wheezing or shortness of breath.   buPROPion 150 MG 12 hr tablet Commonly known as: WELLBUTRIN SR Take 1 tablet (150 mg total) by mouth 2 (two) times daily.   cholecalciferol 25 MCG (1000 UNIT) tablet Commonly known as: VITAMIN D3 Take 1,000 Units by mouth at bedtime.   colchicine 0.6 MG tablet Take 1 tablet (0.6 mg total) by mouth every other day.   Eliquis 5 MG Tabs tablet Generic drug: apixaban Take 1 tablet (5 mg total) by mouth 2 (two) times daily.   Farxiga 10 MG Tabs tablet Generic drug: dapagliflozin propanediol Take 1 tablet (10 mg total) by mouth daily before breakfast.   febuxostat 40 MG tablet Commonly known as: ULORIC Take 3 tablets (120 mg total) by mouth daily.   Ferrex 150 150 MG capsule Generic drug: iron polysaccharides Take 1 capsule (150 mg total) by mouth 2 (two) times daily.   fluticasone 50 MCG/ACT nasal  spray Commonly known as: FLONASE Place 2 sprays into both nostrils daily.   FreeStyle Libre 3 Sensor Misc Use one every 2 weeks.   gabapentin 300 MG capsule Commonly known as: NEURONTIN Take 1 capsule (300 mg total) by mouth 2 (two) times daily. What changed: Another medication with the same name was removed. Continue taking this medication, and follow the directions you see here.   HYDROcodone-acetaminophen 5-325 MG tablet Commonly known as: NORCO/VICODIN Take 1-2 tablets by mouth every 6 (six) hours as needed for moderate pain (pain score 4-6). What changed: Another medication with the same name was removed. Continue taking this medication, and follow the directions you see here.    hydrocortisone 2.5 % cream Apply topically 3 (three) times daily as needed.   insulin lispro 100 UNIT/ML KwikPen Commonly known as: HumaLOG KwikPen Inject 10-20 Units into the skin 3 (three) times daily. Per sliding scale   lidocaine 5 % Commonly known as: LIDODERM Place 1 patch onto the skin daily.PLACE 1 PATCH ONTO THE SKIN FOR 12 (TWELVE) HOURS. REMOVE & DISCARD PATCH WITHIN 12 HOURS OR AS DIRECTED BY MD What changed:  when to take this reasons to take this Another medication with the same name was removed. Continue taking this medication, and follow the directions you see here.   montelukast 10 MG tablet Commonly known as: SINGULAIR Take 1 tablet (10 mg total) by mouth at bedtime. What changed: Another medication with the same name was removed. Continue taking this medication, and follow the directions you see here.   Mounjaro 2.5 MG/0.5ML Pen Generic drug: tirzepatide Inject 2.5 mg into the skin once a week.   multivitamin with minerals Tabs tablet Take 1 tablet by mouth daily.   mycophenolate 180 MG EC tablet Commonly known as: MYFORTIC Take 2 tablets (360 mg total) by mouth 2 (two) times daily.   omeprazole 20 MG capsule Commonly known as: PRILOSEC Take 1 capsule (20 mg total) by mouth daily.   predniSONE 5 MG tablet Commonly known as: DELTASONE Take 1 tablet (5 mg total) by mouth daily.   rosuvastatin 10 MG tablet Commonly known as: CRESTOR Take 1 tablet (10 mg total) by mouth daily. What changed: when to take this   Semglee (yfgn) 100 UNIT/ML Pen Generic drug: insulin glargine-yfgn Inject 40 Units into the skin daily. What changed: when to take this   tacrolimus 1 MG capsule Commonly known as: PROGRAF Take 2 capsules (2 mg total) by mouth 2 times daily.   tamsulosin 0.4 MG Caps capsule Commonly known as: FLOMAX Take 1 capsule (0.4 mg total) by mouth daily.   Testosterone 1.62 % Gel Place 2 Pump onto the skin daily.   Unifine Pentips 31G X 5 MM  Misc Generic drug: Insulin Pen Needle Use 4 times daily as directed   TechLite Pen Needles 31G X 5 MM Misc Generic drug: Insulin Pen Needle 4 (four) times daily.   TechLite Pen Needles 32G X 6 MM Misc Generic drug: Insulin Pen Needle Use 4 times a day   zinc sulfate (50mg  elemental zinc) 220 (50 Zn) MG capsule Take 220 mg by mouth daily.               Discharge Care Instructions  (From admission, onward)           Start     Ordered   02/05/23 0000  Discharge wound care:       Comments: Apply Medical honey to right calf wounds Q day and cover with  foam dressing.  Change foam dressing Q 3 days or PRN soiling   02/05/23 0950            Follow-up Information     [name] I, MD Follow up in 1 week(s).   Specialties: Internal Medicine, Pediatrics Contact information: [address] [place] [address] [phone]                Discharge Exam: Ceasar Mons Weights   02/03/23 1648 02/04/23 1327  Weight: 112 kg 112 kg   General exam: Appears calm and comfortable  Respiratory system: Clear to auscultation. Respiratory effort normal. Cardiovascular system: S1 & S2 heard, RRR. No JVD, murmurs, rubs, gallops or clicks.  Gastrointestinal system: Abdomen is nondistended, soft and nontender. No organomegaly or masses felt. Normal bowel sounds heard. Central nervous system: Alert and oriented. No focal neurological deficits. Extremities: Trace chronic leg edema. Skin: No rashes, lesions or ulcers Psychiatry: Judgement and insight appear normal. Mood & affect appropriate.    Condition at discharge: good  The results of significant diagnostics from this hospitalization (including imaging, microbiology, ancillary and laboratory) are listed below for reference.   Imaging Studies: MR BRAIN WO CONTRAST  Result Date: 02/04/2023 CLINICAL DATA:  Initial evaluation for neuro deficit, stroke suspected. EXAM: MRI HEAD WITHOUT CONTRAST MRA HEAD WITHOUT  CONTRAST MRA NECK WITHOUT CONTRAST TECHNIQUE: Multiplanar, multiecho pulse sequences of the brain and surrounding structures were obtained without intravenous contrast. Angiographic images of the Circle of Willis were obtained using MRA technique without intravenous contrast. Angiographic images of the neck were obtained using MRA technique without intravenous contrast. Carotid stenosis measurements (when applicable) are obtained utilizing NASCET criteria, using the distal internal carotid diameter as the denominator. COMPARISON:  Prior CT from earlier the same day. FINDINGS: MRI HEAD FINDINGS Brain: Examination degraded by motion artifact. Mild age-related cerebral atrophy. Patchy T2/FLAIR hyperintensity involving the periventricular deep white matter both cerebral hemispheres, consistent with chronic small vessel ischemic disease, mild to moderate in nature. No evidence for acute or subacute ischemia. Gray-white matter differentiation maintained. No acute intracranial hemorrhage. Single punctate chronic microhemorrhage noted within the posterior right cerebral hemisphere, of doubtful significance in isolation. No mass lesion, midline shift or mass effect. No hydrocephalus or extra-axial fluid collection. Pituitary gland within normal limits. Vascular: Major intracranial vascular flow voids are maintained. Skull and upper cervical spine: Craniocervical junction within normal limits. Bone marrow signal intensity normal. No scalp soft tissue abnormality. Sinuses/Orbits: Prior bilateral ocular lens replacement. Paranasal sinuses are largely clear. Trace left mastoid effusion, of doubtful significance. Other: None. MRA HEAD FINDINGS ANTERIOR CIRCULATION: Both internal carotid arteries are patent to the termini without stenosis or other abnormality. A1 segments, anterior communicating artery complex common anterior cerebral arteries patent without stenosis. No M1 stenosis or occlusion. Distal MCA branches perfused and  symmetric. POSTERIOR CIRCULATION: Distal V4 segments widely patent. Neither PICA origin visualized. Basilar patent without stenosis. Superior cerebellar and posterior cerebral arteries patent bilaterally. MRA NECK FINDINGS AORTIC ARCH: Visualized aortic arch within normal limits for caliber with standard branch pattern. No stenosis about the origin of the great vessels. RIGHT CAROTID SYSTEM: Right common and internal carotid arteries are patent with antegrade flow. No evidence for dissection or hemodynamically significant stenosis. LEFT CAROTID SYSTEM: Left common and internal carotid arteries are patent with antegrade flow. No evidence for dissection or hemodynamically significant stenosis. VERTEBRAL ARTERIES: Vertebral artery origins not well visualized on this exam. Vertebral arteries are largely codominant with antegrade flow. No dissection or  stenosis. IMPRESSION: MRI HEAD: 1. No acute intracranial abnormality. 2. Mild age-related cerebral atrophy with mild to moderate chronic microvascular ischemic disease. MRA HEAD: Normal intracranial MRA. MRA NECK: Normal MRA of the neck. Electronically Signed   By: [name] M.D.   On: 02/04/2023 00:09   MR ANGIO HEAD WO CONTRAST  Result Date: 02/04/2023 CLINICAL DATA:  Initial evaluation for neuro deficit, stroke suspected. EXAM: MRI HEAD WITHOUT CONTRAST MRA HEAD WITHOUT CONTRAST MRA NECK WITHOUT CONTRAST TECHNIQUE: Multiplanar, multiecho pulse sequences of the brain and surrounding structures were obtained without intravenous contrast. Angiographic images of the Circle of Willis were obtained using MRA technique without intravenous contrast. Angiographic images of the neck were obtained using MRA technique without intravenous contrast. Carotid stenosis measurements (when applicable) are obtained utilizing NASCET criteria, using the distal internal carotid diameter as the denominator. COMPARISON:  Prior CT from earlier the same day. FINDINGS: MRI HEAD  FINDINGS Brain: Examination degraded by motion artifact. Mild age-related cerebral atrophy. Patchy T2/FLAIR hyperintensity involving the periventricular deep white matter both cerebral hemispheres, consistent with chronic small vessel ischemic disease, mild to moderate in nature. No evidence for acute or subacute ischemia. Gray-white matter differentiation maintained. No acute intracranial hemorrhage. Single punctate chronic microhemorrhage noted within the posterior right cerebral hemisphere, of doubtful significance in isolation. No mass lesion, midline shift or mass effect. No hydrocephalus or extra-axial fluid collection. Pituitary gland within normal limits. Vascular: Major intracranial vascular flow voids are maintained. Skull and upper cervical spine: Craniocervical junction within normal limits. Bone marrow signal intensity normal. No scalp soft tissue abnormality. Sinuses/Orbits: Prior bilateral ocular lens replacement. Paranasal sinuses are largely clear. Trace left mastoid effusion, of doubtful significance. Other: None. MRA HEAD FINDINGS ANTERIOR CIRCULATION: Both internal carotid arteries are patent to the termini without stenosis or other abnormality. A1 segments, anterior communicating artery complex common anterior cerebral arteries patent without stenosis. No M1 stenosis or occlusion. Distal MCA branches perfused and symmetric. POSTERIOR CIRCULATION: Distal V4 segments widely patent. Neither PICA origin visualized. Basilar patent without stenosis. Superior cerebellar and posterior cerebral arteries patent bilaterally. MRA NECK FINDINGS AORTIC ARCH: Visualized aortic arch within normal limits for caliber with standard branch pattern. No stenosis about the origin of the great vessels. RIGHT CAROTID SYSTEM: Right common and internal carotid arteries are patent with antegrade flow. No evidence for dissection or hemodynamically significant stenosis. LEFT CAROTID SYSTEM: Left common and internal carotid  arteries are patent with antegrade flow. No evidence for dissection or hemodynamically significant stenosis. VERTEBRAL ARTERIES: Vertebral artery origins not well visualized on this exam. Vertebral arteries are largely codominant with antegrade flow. No dissection or stenosis. IMPRESSION: MRI HEAD: 1. No acute intracranial abnormality. 2. Mild age-related cerebral atrophy with mild to moderate chronic microvascular ischemic disease. MRA HEAD: Normal intracranial MRA. MRA NECK: Normal MRA of the neck. Electronically Signed   By: [name] M.D.   On: 02/04/2023 00:09   MR ANGIO NECK WO CONTRAST  Result Date: 02/04/2023 CLINICAL DATA:  Initial evaluation for neuro deficit, stroke suspected. EXAM: MRI HEAD WITHOUT CONTRAST MRA HEAD WITHOUT CONTRAST MRA NECK WITHOUT CONTRAST TECHNIQUE: Multiplanar, multiecho pulse sequences of the brain and surrounding structures were obtained without intravenous contrast. Angiographic images of the Circle of Willis were obtained using MRA technique without intravenous contrast. Angiographic images of the neck were obtained using MRA technique without intravenous contrast. Carotid stenosis measurements (when applicable) are obtained utilizing NASCET criteria, using the distal internal carotid diameter as the denominator. COMPARISON:  Prior CT from  earlier the same day. FINDINGS: MRI HEAD FINDINGS Brain: Examination degraded by motion artifact. Mild age-related cerebral atrophy. Patchy T2/FLAIR hyperintensity involving the periventricular deep white matter both cerebral hemispheres, consistent with chronic small vessel ischemic disease, mild to moderate in nature. No evidence for acute or subacute ischemia. Gray-white matter differentiation maintained. No acute intracranial hemorrhage. Single punctate chronic microhemorrhage noted within the posterior right cerebral hemisphere, of doubtful significance in isolation. No mass lesion, midline shift or mass effect. No  hydrocephalus or extra-axial fluid collection. Pituitary gland within normal limits. Vascular: Major intracranial vascular flow voids are maintained. Skull and upper cervical spine: Craniocervical junction within normal limits. Bone marrow signal intensity normal. No scalp soft tissue abnormality. Sinuses/Orbits: Prior bilateral ocular lens replacement. Paranasal sinuses are largely clear. Trace left mastoid effusion, of doubtful significance. Other: None. MRA HEAD FINDINGS ANTERIOR CIRCULATION: Both internal carotid arteries are patent to the termini without stenosis or other abnormality. A1 segments, anterior communicating artery complex common anterior cerebral arteries patent without stenosis. No M1 stenosis or occlusion. Distal MCA branches perfused and symmetric. POSTERIOR CIRCULATION: Distal V4 segments widely patent. Neither PICA origin visualized. Basilar patent without stenosis. Superior cerebellar and posterior cerebral arteries patent bilaterally. MRA NECK FINDINGS AORTIC ARCH: Visualized aortic arch within normal limits for caliber with standard branch pattern. No stenosis about the origin of the great vessels. RIGHT CAROTID SYSTEM: Right common and internal carotid arteries are patent with antegrade flow. No evidence for dissection or hemodynamically significant stenosis. LEFT CAROTID SYSTEM: Left common and internal carotid arteries are patent with antegrade flow. No evidence for dissection or hemodynamically significant stenosis. VERTEBRAL ARTERIES: Vertebral artery origins not well visualized on this exam. Vertebral arteries are largely codominant with antegrade flow. No dissection or stenosis. IMPRESSION: MRI HEAD: 1. No acute intracranial abnormality. 2. Mild age-related cerebral atrophy with mild to moderate chronic microvascular ischemic disease. MRA HEAD: Normal intracranial MRA. MRA NECK: Normal MRA of the neck. Electronically Signed   By: [name] M.D.   On: 02/04/2023 00:09    CT HEAD WO CONTRAST  Result Date: 02/03/2023 CLINICAL DATA:  Slurred speech x5 days. EXAM: CT HEAD WITHOUT CONTRAST TECHNIQUE: Contiguous axial images were obtained from the base of the skull through the vertex without intravenous contrast. RADIATION DOSE REDUCTION: This exam was performed according to the departmental dose-optimization program which includes automated exposure control, adjustment of the mA and/or kV according to patient size and/or use of iterative reconstruction technique. COMPARISON:  July 06, 2022 FINDINGS: Brain: There is mild cerebral atrophy with widening of the extra-axial spaces and ventricular dilatation. There are areas of decreased attenuation within the white matter tracts of the supratentorial brain, consistent with microvascular disease changes. Vascular: Marked severity bilateral cavernous carotid artery calcification is noted. Skull: Normal. Negative for fracture or focal lesion. Sinuses/Orbits: There is mild left ethmoid sinus mucosal thickening. Other: None. IMPRESSION: 1. Generalized cerebral atrophy with chronic white matter small vessel ischemic changes. 2. No acute intracranial abnormality. 3. Mild left ethmoid sinus disease. Electronically Signed   By: Aram Candela M.D.   On: 02/03/2023 20:07   VAS [name] LOWER EXTREMITY VENOUS REFLUX  Result Date: 01/06/2023  Lower Venous Reflux Study Patient Name:  Hazim Morter.  Date of Exam:   01/06/2023 Medical Rec #: [id number]                Accession #:    [id number] Date of Birth: 1960-11-19  Patient Gender: M Patient Age:   22 years Exam Location:  [facility] Procedure:      VAS [name] LOWER EXTREMITY VENOUS REFLUX Referring Phys: Sheppard Plumber --------------------------------------------------------------------------------  Indications: Edema.  Performing Technologist: Salvadore Farber RVT  Examination Guidelines: A complete evaluation includes B-mode imaging, spectral Doppler, color  Doppler, and power Doppler as needed of all accessible portions of each vessel. Bilateral testing is considered an integral part of a complete examination. Limited examinations for reoccurring indications may be performed as noted. The reflux portion of the exam is performed with the patient in reverse Trendelenburg. Significant venous reflux is defined as >500 ms in the superficial venous system, and >1 second in the deep venous system.   Summary: Bilateral: - No evidence of deep vein thrombosis seen in the lower extremities, bilaterally, from the common femoral through the popliteal veins. - No evidence of superficial venous thrombosis in the lower extremities, bilaterally. - No evidence of deep venous insufficiency seen bilaterally in the lower extremity. - No evidence of superficial venous reflux seen in the greater saphenous veins bilaterally. - No evidence of superficial venous reflux seen in the short saphenous veins bilaterally.  *See table(s) above for measurements and observations. Electronically signed by Festus Barren MD on 01/06/2023 at 12:59:23 PM.    Final    VAS [name] ABI WITH/WO TBI  Result Date: 01/06/2023  LOWER EXTREMITY DOPPLER STUDY Patient Name:  Ad Glave  Date of Exam:   01/06/2023 Medical Rec #: [id number]            Accession #:    [id number] Date of Birth: 1960/12/03            Patient Gender: M Patient Age:   12 years Exam Location:  [facility] Procedure:      VAS [name] ABI WITH/WO TBI Referring Phys: --------------------------------------------------------------------------------  Indications: Peripheral artery disease. High Risk Factors: Hypertension. Other Factors: Left slow healing wound. Improving.  Comparison Study: 04/2021 Performing Technologist: Salvadore Farber RVT  Examination Guidelines: A complete evaluation includes at minimum, Doppler waveform signals and systolic blood pressure reading at the level of bilateral brachial, anterior tibial, and posterior tibial  arteries, when vessel segments are accessible. Bilateral testing is considered an integral part of a complete examination. Photoelectric Plethysmograph (PPG) waveforms and toe systolic pressure readings are included as required and additional duplex testing as needed. Limited examinations for reoccurring indications may be performed as noted.  ABI Findings: +---------+------------------+-----+---------+--------+ Right    Rt Pressure (mmHg)IndexWaveform Comment  +---------+------------------+-----+---------+--------+ Brachial 142                                      +---------+------------------+-----+---------+--------+ ATA                             triphasicNonComp  +---------+------------------+-----+---------+--------+ PTA                             triphasicNonComp  +---------+------------------+-----+---------+--------+ Great Toe118               0.79 Normal            +---------+------------------+-----+---------+--------+ +---------+------------------+-----+---------+-------+ Left     Lt Pressure (mmHg)IndexWaveform Comment +---------+------------------+-----+---------+-------+ Brachial 150                                     +---------+------------------+-----+---------+-------+  ATA                             triphasicNonComp +---------+------------------+-----+---------+-------+ PTA                             triphasicNonComp +---------+------------------+-----+---------+-------+ Great Toe124               0.83 Normal           +---------+------------------+-----+---------+-------+ +-------+-----------+-----------+------------+------------+ ABI/TBIToday's ABIToday's TBIPrevious ABIPrevious TBI +-------+-----------+-----------+------------+------------+ Right  NonComp    .79        NonComp     1.03         +-------+-----------+-----------+------------+------------+ Left   NonComp    .83        NonComp     .90           +-------+-----------+-----------+------------+------------+  Bilateral ABIs and TBIs appear essentially unchanged compared to prior study on 04/2021.  Summary: Right: Resting right ankle-brachial index indicates noncompressible right lower extremity arteries. The right toe-brachial index is normal. Left: Resting left ankle-brachial index indicates noncompressible left lower extremity arteries. The left toe-brachial index is normal. *See table(s) above for measurements and observations.  Electronically signed by Festus Barren MD on 01/06/2023 at 12:59:16 PM.    Final     Microbiology: Results for orders placed or performed in visit on 10/14/22  Microscopic Examination     Status: Abnormal   Collection Time: 10/14/22  9:33 AM   Urine  Result Value Ref Range Status   WBC, UA 0-5 0 - 5 /hpf Final   RBC, Urine None seen 0 - 2 /hpf Final   Epithelial Cells (non renal) None seen 0 - 10 /hpf Final   Casts Present (A) None seen /lpf Final   Cast Type Hyaline casts N/A Final   Bacteria, UA None seen None seen/Few Final  CULTURE, URINE COMPREHENSIVE     Status: Abnormal   Collection Time: 10/14/22  1:54 PM   Specimen: Urine   UR  Result Value Ref Range Status   Urine Culture, Comprehensive Final report (A)  Final   Organism ID, Bacteria Enterococcus faecalis (A)  Final    Comment: For Enterococcus species, aminoglycosides (except for high-level resistance screening), cephalosporins, clindamycin, and trimethoprim-sulfamethoxazole are not effective clinically. (CLSI, M100-S26, 2016) 500  Colonies/mL    ANTIMICROBIAL SUSCEPTIBILITY Comment  Final    Comment:       ** S = Susceptible; I = Intermediate; R = Resistant **                    P = Positive; N = Negative             MICS are expressed in micrograms per mL    Antibiotic                 RSLT#1    RSLT#2    RSLT#3    RSLT#4 Ciprofloxacin                  S Levofloxacin                   S Nitrofurantoin                 S Penicillin                      S Tetracycline  R Vancomycin                     S    *Note: Due to a large number of results and/or encounters for the requested time period, some results have not been displayed. A complete set of results can be found in Results Review.    Labs: CBC: Recent Labs  Lab 02/03/23 1650 02/05/23 0544  WBC 5.6 4.1  NEUTROABS 3.7  --   HGB 13.7 12.5*  HCT 43.0 39.0  MCV 90.0 88.0  PLT 179 146*   Basic Metabolic Panel: Recent Labs  Lab 02/03/23 1650 02/03/23 2220 02/04/23 0142 02/04/23 0401 02/05/23 0544  NA 137 136 136 139 140  K 6.0* 5.4* 5.2* 5.2* 4.6  CL 104 106 107 107 105  CO2 25 23 24 25 25   GLUCOSE 97 154* 178* 133* 95  BUN 123* 131* 124* 122* 104*  CREATININE 4.30* 4.14* 3.90* 3.88* 2.98*  CALCIUM 9.1 8.9 8.6* 8.9 9.0  MG  --   --   --  2.9* 2.7*   Liver Function Tests: Recent Labs  Lab 02/03/23 1650  AST 24  ALT 15  ALKPHOS 128*  BILITOT 0.5  PROT 8.4*  ALBUMIN 4.2   CBG: Recent Labs  Lab 02/04/23 0738 02/04/23 1227 02/04/23 1618 02/04/23 1954 02/05/23 0741  GLUCAP 98 147* 109* 175* 186*    Discharge time spent: greater than 30 minutes.  Signed: Marrion Coy, MD [facility] 02/05/2023

## 2023-02-05 NOTE — TOC CM/SW Note (Signed)
Transition of Care [facility]) - Inpatient Brief Assessment   Patient Details  Name: [name]. MRN: [id number] Date of Birth: Jan 22, 1961  Transition of Care [facility]) CM/SW Contact:    Allena Katz, LCSW Phone Number: 02/05/2023, 9:57 AM   Clinical Narrative:    Transition of Care Asessment: Insurance and Status: Insurance coverage has been reviewed Patient has primary care physician: Yes Home environment has been reviewed: [address] [place] [id number] Prior level of function:: independent Prior/Current Home Services: No current home services Social Determinants of Health Reivew: SDOH reviewed no interventions necessary Readmission risk has been reviewed: Yes Transition of care needs: no transition of care needs at this time

## 2023-02-06 ENCOUNTER — Telehealth: Payer: Self-pay | Admitting: *Deleted

## 2023-02-06 NOTE — Telephone Encounter (Signed)
Sent prior auth to [facility] for testosterone gel  MedImpact_2017 updated the outcome for this PA: Favorable  Request Key: [id number] PA created on: 2023-01-16 14:25:29 -0400

## 2023-02-07 ENCOUNTER — Other Ambulatory Visit: Payer: Commercial Managed Care - PPO

## 2023-02-07 ENCOUNTER — Telehealth: Payer: Self-pay

## 2023-02-07 DIAGNOSIS — N400 Enlarged prostate without lower urinary tract symptoms: Secondary | ICD-10-CM | POA: Diagnosis not present

## 2023-02-07 NOTE — Telephone Encounter (Signed)
Spoke with patient and advised that testosterone gel has been approved.

## 2023-02-07 NOTE — Transitions of Care (Post Inpatient/ED Visit) (Unsigned)
   02/07/2023  Name: [name]. MRN: [id number] DOB: [birth year]  Today's TOC FU Call Status: Today's TOC FU Call Status:: Unsuccessful Call (1st Attempt) Unsuccessful Call (1st Attempt) Date: 02/07/23  Attempted to reach the patient regarding the most recent Inpatient/ED visit.  Follow Up Plan: Additional outreach attempts will be made to reach the patient to complete the Transitions of Care (Post Inpatient/ED visit) call.   Signature Karena Addison, LPN [facility] Nurse Health Advisor Direct Dial [phone]

## 2023-02-08 LAB — PSA: Prostate Specific Ag, Serum: 1.3 ng/mL (ref 0.0–4.0)

## 2023-02-08 LAB — TESTOSTERONE: Testosterone: 318 ng/dL (ref 264–916)

## 2023-02-08 LAB — HEMATOCRIT: Hematocrit: 41 % (ref 37.5–51.0)

## 2023-02-10 ENCOUNTER — Encounter: Payer: Self-pay | Admitting: Oncology

## 2023-02-10 NOTE — Transitions of Care (Post Inpatient/ED Visit) (Unsigned)
   02/10/2023  Name: [name]. MRN: [id number] DOB: [birth year]  Today's TOC FU Call Status: Today's TOC FU Call Status:: Unsuccessful Call (2nd Attempt) Unsuccessful Call (1st Attempt) Date: 02/07/23 Unsuccessful Call (2nd Attempt) Date: 02/10/23  Attempted to reach the patient regarding the most recent Inpatient/ED visit.  Follow Up Plan: Additional outreach attempts will be made to reach the patient to complete the Transitions of Care (Post Inpatient/ED visit) call.   Signature Karena Addison, LPN [facility] Nurse Health Advisor Direct Dial [phone]

## 2023-02-11 ENCOUNTER — Encounter: Payer: Self-pay | Admitting: Internal Medicine

## 2023-02-11 ENCOUNTER — Ambulatory Visit (INDEPENDENT_AMBULATORY_CARE_PROVIDER_SITE_OTHER): Payer: Commercial Managed Care - PPO | Admitting: Internal Medicine

## 2023-02-11 VITALS — BP 130/60 | HR 35 | Temp 98.1°F | Ht 70.0 in | Wt 255.0 lb

## 2023-02-11 DIAGNOSIS — I5022 Chronic systolic (congestive) heart failure: Secondary | ICD-10-CM | POA: Diagnosis not present

## 2023-02-11 DIAGNOSIS — F39 Unspecified mood [affective] disorder: Secondary | ICD-10-CM | POA: Diagnosis not present

## 2023-02-11 DIAGNOSIS — Z794 Long term (current) use of insulin: Secondary | ICD-10-CM | POA: Diagnosis not present

## 2023-02-11 DIAGNOSIS — E1122 Type 2 diabetes mellitus with diabetic chronic kidney disease: Secondary | ICD-10-CM | POA: Diagnosis not present

## 2023-02-11 DIAGNOSIS — N184 Chronic kidney disease, stage 4 (severe): Secondary | ICD-10-CM | POA: Diagnosis not present

## 2023-02-11 DIAGNOSIS — R4701 Aphasia: Secondary | ICD-10-CM | POA: Diagnosis not present

## 2023-02-11 DIAGNOSIS — N170 Acute kidney failure with tubular necrosis: Secondary | ICD-10-CM

## 2023-02-11 LAB — RENAL FUNCTION PANEL
Albumin: 3.8 g/dL (ref 3.5–5.2)
BUN: 70 mg/dL — ABNORMAL HIGH (ref 6–23)
CO2: 27 meq/L (ref 19–32)
Calcium: 9.2 mg/dL (ref 8.4–10.5)
Chloride: 104 meq/L (ref 96–112)
Creatinine, Ser: 1.88 mg/dL — ABNORMAL HIGH (ref 0.40–1.50)
GFR: 37.58 mL/min — ABNORMAL LOW (ref >60.00)
Glucose, Bld: 115 mg/dL — ABNORMAL HIGH (ref 70–99)
Phosphorus: 2.9 mg/dL (ref 2.3–4.6)
Potassium: 3.3 meq/L — ABNORMAL LOW (ref 3.5–5.1)
Sodium: 142 meq/L (ref 135–145)

## 2023-02-11 LAB — CBC
HCT: 39.2 % (ref 39.0–52.0)
Hemoglobin: 12.7 g/dL — ABNORMAL LOW (ref 13.0–17.0)
MCHC: 32.5 g/dL (ref 30.0–36.0)
MCV: 88 fL (ref 78.0–100.0)
Platelets: 133 10*3/uL — ABNORMAL LOW (ref 150.0–400.0)
RBC: 4.45 Mil/uL (ref 4.22–5.81)
RDW: 14.8 % (ref 11.5–15.5)
WBC: 5.2 10*3/uL (ref 4.0–10.5)

## 2023-02-11 LAB — TSH: TSH: 2.31 u[IU]/mL (ref 0.35–5.50)

## 2023-02-11 NOTE — Assessment & Plan Note (Signed)
Having low sugar reactions--perhaps due to AKI Off the mounjaro and lispro Cut the semglee to 20 Also farxiga 10

## 2023-02-11 NOTE — Assessment & Plan Note (Signed)
Likely due to dehydration from diarrhea Some better with rehydration and holding torsemide Will recheck Needs to be seen back by transplant team

## 2023-02-11 NOTE — Transitions of Care (Post Inpatient/ED Visit) (Signed)
   02/11/2023  Name: [name]. MRN: [id number] DOB: December 14, 1960  Today's TOC FU Call Status: Today's TOC FU Call Status:: Unsuccessful Call (2nd Attempt) Unsuccessful Call (1st Attempt) Date: 02/07/23 Unsuccessful Call (2nd Attempt) Date: 02/10/23  Attempted to reach the patient regarding the most recent Inpatient/ED visit.  Follow Up Plan: No further outreach attempts will be made at this time. We have been unable to contact the patient. Patient already seen in office Signature Karena Addison, LPN [facility] Nurse Health Advisor Direct Dial [phone]

## 2023-02-11 NOTE — Assessment & Plan Note (Signed)
Very frustrated by pain/medical issues He doesn't want more medication (??consider duloxetine)

## 2023-02-11 NOTE — Assessment & Plan Note (Signed)
Temporary and no evidence of CVA Might have been due to AKI with GFR down to 15

## 2023-02-11 NOTE — Progress Notes (Signed)
Subjective:    Patient ID: Carl Deshawn Broom., male    DOB: Aug 14, 1960, 62 y.o.   MRN: [id number]  HPI Here for hospital follow up Admitted to [facility] with aphasia on 11/11 Imaging fortunately did not show a stroke (MRI---and had benigh MRA of head and neck) Had AKI though--creatinine up to 4.14 (GFR 15) but then dropped to 2.98 (GFR 23) No apparent changes in volume status or CHF exacerbation Sent home 11/13  Expressive aphasia--couldn't get his words out Went to Dr Garen Lah it was related to diabetes meds Was having diarrhea---and couldn't keep up with fluids She sent him to ER  Now --"just can't get warm" Has to crank the heat Wondered about infection---some increased urinary frquency--but no dysuria or hematuria No fever Has "no energy whatsoever"  Having low sugar reactions with the insulin---has been getting up to eat due to bottoming out Cut the insulin to 20units from 40 (semglee). 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 So restarted 20mg  yesterday   Current Outpatient Medications on File Prior to Visit  Medication Sig Dispense Refill   albuterol (PROVENTIL) (2.5 MG/3ML) 0.083% nebulizer solution Take 3 mLs (2.5 mg total) by nebulization every 6 (six) hours as needed for wheezing or shortness of breath. 150 mL 0   albuterol (VENTOLIN HFA) 108 (90 Base) MCG/ACT inhaler Inhale 2 puffs into the lungs every 6 (six) hours as needed for wheezing or shortness of breath. 6.7 g 5   apixaban (ELIQUIS) 5 MG TABS tablet Take 1 tablet (5 mg total) by mouth 2 (two) times daily. 180 tablet 3   buPROPion (WELLBUTRIN SR) 150 MG 12 hr tablet Take 1 tablet (150 mg total) by mouth 2 (two) times daily. 180 tablet 3   cholecalciferol (VITAMIN D3) 25 MCG (1000 UNIT)  tablet Take 1,000 Units by mouth at bedtime.     colchicine 0.6 MG tablet Take 1 tablet (0.6 mg total) by mouth every other day. 45 tablet 1   Continuous Glucose Sensor (FREESTYLE LIBRE 3 SENSOR) MISC Use one every 2 weeks. 6 each 3   dapagliflozin propanediol (FARXIGA) 10 MG TABS tablet Take 1 tablet (10 mg total) by mouth daily before breakfast. 90 tablet 3   febuxostat (ULORIC) 40 MG tablet Take 3 tablets (120 mg total) by mouth daily. 90 tablet 5   fluticasone (FLONASE) 50 MCG/ACT nasal spray Place 2 sprays into both nostrils daily. 16 g 11   gabapentin (NEURONTIN) 300 MG capsule Take 1 capsule (300 mg total) by mouth 2 (two) times daily. 180 capsule 0   HYDROcodone-acetaminophen (NORCO/VICODIN) 5-325 MG tablet Take 1-2 tablets by mouth every 6 (six) hours as needed for moderate pain (pain score 4-6). 30 tablet 0   hydrocortisone 2.5 % cream Apply topically 3 (three) times daily as needed. 30 g 3   insulin glargine-yfgn (SEMGLEE) 100 UNIT/ML Pen Inject 40 Units into the skin daily. (Patient taking differently: Inject 40 Units into the skin at bedtime.) 45 mL 3   insulin lispro (HUMALOG KWIKPEN) 100 UNIT/ML KwikPen Inject 10-20 Units into the skin 3 (three) times daily. Per sliding scale     Insulin Pen Needle (TECHLITE PEN NEEDLES) 31G X 5 MM MISC 4 (four) times daily. 400 each 3   Insulin Pen Needle (TECHLITE PEN NEEDLES) 32G X  6 MM MISC Use 4 times a day 100 each 3   Insulin Pen Needle (UNIFINE PENTIPS) 31G X 5 MM MISC Use 4 times daily as directed 400 each 3   lidocaine (LIDODERM) 5 % Place 1 patch onto the skin daily.PLACE 1 PATCH ONTO THE SKIN FOR 12 (TWELVE) HOURS. REMOVE & DISCARD PATCH WITHIN 12 HOURS OR AS DIRECTED BY MD (Patient taking differently: Place 1 patch onto the skin daily as needed.) 60 patch 11   montelukast (SINGULAIR) 10 MG tablet Take 1 tablet (10 mg total) by mouth at bedtime. 30 tablet 11   Multiple Vitamin (MULTIVITAMIN WITH MINERALS) TABS tablet Take 1 tablet by mouth  daily.     mycophenolate (MYFORTIC) 180 MG EC tablet Take 2 tablets (360 mg total) by mouth 2 (two) times daily. 360 tablet 3   omeprazole (PRILOSEC) 20 MG capsule Take 1 capsule (20 mg total) by mouth daily. 30 capsule 11   predniSONE (DELTASONE) 5 MG tablet Take 1 tablet (5 mg total) by mouth daily. 90 tablet 3   rosuvastatin (CRESTOR) 10 MG tablet Take 1 tablet (10 mg total) by mouth daily. (Patient taking differently: Take 10 mg by mouth every evening.) 90 tablet 3   tacrolimus (PROGRAF) 1 MG capsule Take 2 capsules (2 mg total) by mouth 2 times daily. 120 capsule 11   tamsulosin (FLOMAX) 0.4 MG CAPS capsule Take 1 capsule (0.4 mg total) by mouth daily. 30 capsule 11   Testosterone 20.25 MG/ACT (1.62%) GEL Place 2 Pump onto the skin daily. 150 g 3   zinc sulfate 220 (50 Zn) MG capsule Take 220 mg by mouth daily.     iron polysaccharides (FERREX 150) 150 MG capsule Take 1 capsule (150 mg total) by mouth 2 (two) times daily. (Patient not taking: Reported on 02/11/2023) 180 capsule 0   No current facility-administered medications on file prior to visit.    Allergies  Allergen Reactions   Contrast Media [Iodinated Contrast Media] Other (See Comments)    Kidney transplant   Iodine Other (See Comments)    Other reaction(s): Other (See Comments) Kidney transplant   Nsaids Other (See Comments)    Kidney transplant   Ibuprofen Other (See Comments)    Due to kidney transplant Due to kidney transplant Due to kidney transplant    Past Medical History:  Diagnosis Date   Anal condyloma    Anemia    Aortic atherosclerosis (HCC)    Aortic stenosis    a.) TTE 10/2021: Mod AS. AVA 1.06cm^2 (VTI). Mean grad ; b.) TTE 04/02/2022: no AV stenosis   Asthma, persistent controlled 02/25/2013   Attention or concentration deficit 04/26/2021   BPH with obstruction/lower urinary tract symptoms 09/25/2014   CAD (coronary artery disease) 2005   a.) LHC 1996 -> HG stenosis mLAD -> PTCA/PCI with stent  x 1 (unk type) -> complicated by CFA pseudoanurysm (required surg);  b.) LHC 08/10/2002  -> 95% LAD, 70% ISR LAD, 80% pOM, 70% mRCA --> CVTS consult; c.) 4v CABG 08/13/2002; c.) 03/2018 MV: Small, fixed inferoapical and apical sep defect. No isc. EF 47% (60-65% by 05/2018 TTE); d.) 02/2021 MV: small Apical inf fixed defect. No isc -> low risk.   Cardiomegaly    Cellulitis and abscess of left leg 07/22/2020   Chronic atrial fibrillation    a.) CHA2DS2VASc = 4 (CHF, HTN, vascular disease history, T2DM);  b.) rate/rhythm maintained without pharmacological intervention; chronically anticoagulated with apixaban   Chronic combined systolic and diastolic CHF (  congestive heart failure)    a.) TTE 7/18 : EF 45-50%; b.) TTE 05/2018 : EF 60-65%, RVSP 74.8; c.) TTE 12/2018: EF 50-55%. Sev dil LA; d.) TTE 04/2019: EF 45-50%; e.) TTE 07/2021: EF 40-45%, G3DD; f.) TTE 10/2021: EF 40-45%, mild conc LVH, septal-lat dyssynchrony (LBBB), mildly red RVSF, mild-mod dil LA, mildly dil RA, mild-mod MS, mod AS; g.) TTE 04/02/2022: EF 40-45%, glob HK, LVH, red RVSF, RVE, sev LAE, mild-mod RAE, mild MR   Chronic venous stasis dermatitis of both lower extremities    Cytomegaloviral disease 2017   Diverticulosis of colon 04/22/2013   Dyspnea    Elevated RIGHT hemidiaphragm    Erectile dysfunction    a.) on topical TRT + Trimix (alprostadil/papaverine/phentolamine) injections   ESRD (end stage renal disease) (HCC)    a.) dialysis dependent 2004-2012; b.) s/p cadaveric RIGHT renal transplant 09/13/2010   Essential hypertension 12/17/2010   GERD (gastroesophageal reflux disease)    Gout    History of 2019 novel coronavirus disease (COVID-19) 06/30/2019   History of bilateral cataract extraction 10/2017   History of renal transplant 09/13/2010   a.) s/p cadaveric donor transplant 09/13/2010   Hx of bilateral cataract extraction 10/2017   Hyperlipidemia    Hypogonadism in male 09/25/2014   Ischemic cardiomyopathy    Left  cephalic vein thrombosis 06/2019   Long term current use of anticoagulant    a.) apixaban   Long term current use of immunosuppressive drug    a.) mycophenolate + prednisone + tacrolimus   Major depressive disorder 10/04/2013   Mitral stenosis 11/07/2021   a.) TTE 11/07/2021: severe MAC, mild-mod MS (MPG 6 mmHg); b.) TTE 04/02/2022: no MV stenosis   Murmur    Obesity hypoventilation syndrome 03/31/2012   OSA treated with BiPAP 09/29/2012   PAH (pulmonary artery hypertension)    a. 04/2019 RHC: RA 19, RV 80/20, PA 78/31 (51), PCWP 25, CO/CI 7.69/3.26. PVR 3.25 --> Sev PAH, likely primarily PV HTN; b.) RHC 04/04/2022: mRA 13, mPA 40, mPCWP 20, PA sat 59, AO sat 92, CO 7.63, CI 3.26, PVR 2.6 --> mod portal venous HTN   Pancreatitis    S/P CABG x 4 08/13/2002   a.) LIMA-LAD, LRA-OM, SVG-D1, SVG-dRCA   S/P gastric bypass    Synovitis of finger 06/11/2019   Trigger finger, unspecified little finger 12/23/2018   Type 2 diabetes mellitus with hyperglycemia, with long-term current use of insulin 09/09/2014   a.) has FreeStyle Libre CGM   Ulcer    Left shin goes to wound care center [facility] qweek   Wears glasses    Wears hearing aid in both ears     Past Surgical History:  Procedure Laterality Date   CARDIAC CATHETERIZATION N/A 08/10/2002   CATARACT EXTRACTION W/PHACO Right 10/29/2017   Procedure: CATARACT EXTRACTION PHACO AND INTRAOCULAR LENS PLACEMENT (IOC)  RIGHT DIABETIC;  Surgeon: Lockie Mola, MD;  Location: [facility];  Service: Ophthalmology;  Laterality: Right   CATARACT EXTRACTION W/PHACO Left 11/18/2017   Procedure: CATARACT EXTRACTION PHACO AND INTRAOCULAR LENS PLACEMENT (IOC) LEFT IVA/TOPICAL;  Surgeon: Lockie Mola, MD;  Location: [facility];  Service: Ophthalmology;  Laterality: Left   COLONOSCOPY WITH PROPOFOL N/A 04/22/2013   Procedure: COLONOSCOPY WITH PROPOFOL;  Surgeon: Rachael Fee, MD;  Location: [facility];  Service: Endoscopy;   Laterality: N/A;   CORONARY ANGIOPLASTY WITH STENT PLACEMENT N/A 1996   CORONARY ARTERY BYPASS GRAFT N/A 08/13/2002   Procedure: 4v CORONARY ARTERY BYPASS GRAFTING; Location: [facility]; Surgeon: Katy Apo, MD   CYSTOSCOPY WITH INSERTION OF UROLIFT N/A 12/25/2021   Procedure: CYSTOSCOPY WITH INSERTION OF UROLIFT;  Surgeon: Riki Altes, MD;  Location: [facility];  Service: Urology;  Laterality: N/A;   DG ANGIO AV SHUNT*L*     right and left upper arms   FASCIOTOMY  03/03/2012   Procedure: FASCIOTOMY;  Surgeon: Nicki Reaper, MD;  Location: [facility];  Service: Orthopedics;  Laterality: Right;  FASCIOTOMY RIGHT SMALL FINGER   FASCIOTOMY Left 08/17/2013   Procedure: FASCIOTOMY LEFT RING;  Surgeon: Nicki Reaper, MD;  Location: [facility];  Service: Orthopedics;  Laterality: Left;   INCISION AND DRAINAGE ABSCESS Left 10/15/2015   Procedure: INCISION AND DRAINAGE ABSCESS;  Surgeon: Leafy Ro, MD;  Location: [facility];  Service: General;  Laterality: Left;   KIDNEY TRANSPLANT  09/13/2010   cadaver--at [facility]   LASER ABLATION CONDOLAMATA N/A 01/08/2023   Procedure: LASER REMOVAL ABLATION OF CONDYLOMATA;  Surgeon: Karie Soda, MD;  Location: [facility];  Service: General;  Laterality: N/A;  GEN AND LOCAL   RECTAL EXAM UNDER ANESTHESIA N/A 01/08/2023   Procedure: ANORECTAL EXAM UNDER ANESTHESIA;  Surgeon: Karie Soda, MD;  Location: [facility];  Service: General;  Laterality: N/A;   RIGHT HEART CATH N/A 11/15/2016   Procedure: RIGHT HEART CATH;  Surgeon: Iran Ouch, MD;  Location: [facility];  Service: Cardiovascular;  Laterality: N/A;   RIGHT HEART CATH N/A 05/03/2019   Procedure: RIGHT HEART CATH;  Surgeon: Laurey Morale, MD;  Location: [facility];  Service: Cardiovascular;  Laterality: N/A;   RIGHT HEART CATH N/A 04/04/2022   Procedure: RIGHT HEART CATH;  Surgeon: Laurey Morale, MD;  Location: [facility];  Service:  Cardiovascular;  Laterality: N/A;   ROUX-EN-Y GASTRIC BYPASS N/A 2015   TYMPANIC MEMBRANE REPAIR Left 03/2010   VASECTOMY     WART FULGURATION N/A 05/31/2022   Procedure: FULGURATION ANAL WART;  Surgeon: Sung Amabile, DO;  Location: [facility];  Service: General;  Laterality: N/A;    Family History  Problem Relation Age of Onset   Heart disease Father    Kidney failure Father    Kidney disease Father    Diabetes Maternal Grandmother    Breast cancer Maternal Grandmother    Valvular heart disease Mother    Liver cancer Paternal Uncle    Liver cancer Paternal Grandmother    Prostate cancer Neg Hx     Social History   Socioeconomic History   Marital status: Married    Spouse name: Not on file   Number of children: 2   Years of education: 14   Highest education level: Associate degree: academic program  Occupational History   Occupation: Presenter, broadcasting: unemployed    Comment: disabled due to kidney failure  Tobacco Use   Smoking status: Former    Types: Cigars    Quit date: 09/02/1994    Years since quitting: 28.4    Passive exposure: Never   Smokeless tobacco: Never  Vaping Use   Vaping status: Never Used  Substance and Sexual Activity   Alcohol use: Not Currently    Comment: occ   Drug use: No   Sexual activity: Not on file  Other Topics Concern   Not on file  Social History Narrative   Has living will   Wife is health care POA---son Molli Hazard is alternate   Would accept  resuscitation attempts   No tube feedings if cognitively unaware   Social Determinants of Health   Financial Resource Strain: Not on file  Food Insecurity: No Food Insecurity (02/04/2023)   Hunger Vital Sign    Worried About Running Out of Food in the Last Year: Never true    Ran Out of Food in the Last Year: Never true  Transportation Needs: No Transportation Needs (02/04/2023)   PRAPARE - Administrator, Civil Service (Medical): No    Lack of  Transportation (Non-Medical): No  Physical Activity: Not on file  Stress: Not on file  Social Connections: Not on file  Intimate Partner Violence: Not At Risk (02/04/2023)   Humiliation, Afraid, Rape, and Kick questionnaire    Fear of Current or Ex-Partner: No    Emotionally Abused: No    Physically Abused: No    Sexually Abused: No   Review of Systems Appetite is not good---stopped the mounjaro Last A1c 8.0% in hospital Not sleeping well---"freezing" in bed    Objective:   Physical Exam Constitutional:      Appearance: Normal appearance.  Cardiovascular:     Rate and Rhythm: Normal rate. Rhythm irregular.     Heart sounds:     No gallop.  Pulmonary:     Effort: Pulmonary effort is normal.     Breath sounds: Normal breath sounds. No wheezing or rales.  Musculoskeletal:     Cervical back: Neck supple.     Comments: At [facility] ankle edema  Lymphadenopathy:     Cervical: No cervical adenopathy.  Neurological:     Mental Status: He is alert.  Psychiatric:     Comments: Frustrated by his medical issues            Assessment & Plan:

## 2023-02-11 NOTE — Assessment & Plan Note (Signed)
Does monitor weight and restarted low dose torsemide for him (20mg ) yesterday due to tight abdomen Going to Dr Shirlee Latch tomorrow

## 2023-02-12 ENCOUNTER — Other Ambulatory Visit: Payer: Self-pay

## 2023-02-12 ENCOUNTER — Inpatient Hospital Stay (HOSPITAL_COMMUNITY)
Admission: RE | Admit: 2023-02-12 | Discharge: 2023-02-12 | Disposition: A | Discharge status: Home / Self Care | Payer: Commercial Managed Care - PPO | Source: Ambulatory Visit | Attending: Cardiology | Admitting: Cardiology

## 2023-02-12 ENCOUNTER — Encounter (HOSPITAL_COMMUNITY): Payer: Self-pay | Admitting: Cardiology

## 2023-02-12 ENCOUNTER — Ambulatory Visit (HOSPITAL_COMMUNITY)
Admission: RE | Admit: 2023-02-12 | Discharge: 2023-02-12 | Disposition: A | Discharge status: Home / Self Care | Payer: Commercial Managed Care - PPO | Source: Ambulatory Visit | Attending: Cardiology | Admitting: Cardiology

## 2023-02-12 ENCOUNTER — Other Ambulatory Visit (HOSPITAL_COMMUNITY): Payer: Self-pay | Admitting: Cardiology

## 2023-02-12 VITALS — BP 130/70 | HR 44 | Wt 255.8 lb

## 2023-02-12 DIAGNOSIS — R001 Bradycardia, unspecified: Secondary | ICD-10-CM

## 2023-02-12 DIAGNOSIS — I272 Pulmonary hypertension, unspecified: Secondary | ICD-10-CM | POA: Diagnosis not present

## 2023-02-12 DIAGNOSIS — I5022 Chronic systolic (congestive) heart failure: Secondary | ICD-10-CM | POA: Insufficient documentation

## 2023-02-12 DIAGNOSIS — I482 Chronic atrial fibrillation, unspecified: Secondary | ICD-10-CM | POA: Diagnosis not present

## 2023-02-12 DIAGNOSIS — R197 Diarrhea, unspecified: Secondary | ICD-10-CM | POA: Insufficient documentation

## 2023-02-12 DIAGNOSIS — I2510 Atherosclerotic heart disease of native coronary artery without angina pectoris: Secondary | ICD-10-CM | POA: Insufficient documentation

## 2023-02-12 DIAGNOSIS — N186 End stage renal disease: Secondary | ICD-10-CM | POA: Insufficient documentation

## 2023-02-12 DIAGNOSIS — Z79624 Long term (current) use of inhibitors of nucleotide synthesis: Secondary | ICD-10-CM | POA: Insufficient documentation

## 2023-02-12 DIAGNOSIS — Z951 Presence of aortocoronary bypass graft: Secondary | ICD-10-CM | POA: Insufficient documentation

## 2023-02-12 DIAGNOSIS — I132 Hypertensive heart and chronic kidney disease with heart failure and with stage 5 chronic kidney disease, or end stage renal disease: Secondary | ICD-10-CM | POA: Diagnosis not present

## 2023-02-12 DIAGNOSIS — N179 Acute kidney failure, unspecified: Secondary | ICD-10-CM | POA: Insufficient documentation

## 2023-02-12 DIAGNOSIS — I255 Ischemic cardiomyopathy: Secondary | ICD-10-CM | POA: Diagnosis not present

## 2023-02-12 DIAGNOSIS — T8619 Other complication of kidney transplant: Secondary | ICD-10-CM | POA: Insufficient documentation

## 2023-02-12 DIAGNOSIS — Z794 Long term (current) use of insulin: Secondary | ICD-10-CM | POA: Insufficient documentation

## 2023-02-12 DIAGNOSIS — Z79621 Long term (current) use of calcineurin inhibitor: Secondary | ICD-10-CM | POA: Insufficient documentation

## 2023-02-12 DIAGNOSIS — D649 Anemia, unspecified: Secondary | ICD-10-CM | POA: Insufficient documentation

## 2023-02-12 DIAGNOSIS — Y830 Surgical operation with transplant of whole organ as the cause of abnormal reaction of the patient, or of later complication, without mention of misadventure at the time of the procedure: Secondary | ICD-10-CM | POA: Diagnosis not present

## 2023-02-12 DIAGNOSIS — G4733 Obstructive sleep apnea (adult) (pediatric): Secondary | ICD-10-CM | POA: Diagnosis not present

## 2023-02-12 DIAGNOSIS — I252 Old myocardial infarction: Secondary | ICD-10-CM | POA: Insufficient documentation

## 2023-02-12 DIAGNOSIS — I5032 Chronic diastolic (congestive) heart failure: Secondary | ICD-10-CM

## 2023-02-12 DIAGNOSIS — Z7901 Long term (current) use of anticoagulants: Secondary | ICD-10-CM | POA: Diagnosis not present

## 2023-02-12 DIAGNOSIS — I5042 Chronic combined systolic (congestive) and diastolic (congestive) heart failure: Secondary | ICD-10-CM | POA: Diagnosis not present

## 2023-02-12 DIAGNOSIS — E1122 Type 2 diabetes mellitus with diabetic chronic kidney disease: Secondary | ICD-10-CM | POA: Insufficient documentation

## 2023-02-12 DIAGNOSIS — Z79899 Other long term (current) drug therapy: Secondary | ICD-10-CM | POA: Diagnosis not present

## 2023-02-12 DIAGNOSIS — Z87891 Personal history of nicotine dependence: Secondary | ICD-10-CM | POA: Diagnosis not present

## 2023-02-12 MED ORDER — POTASSIUM CHLORIDE CRYS ER 20 MEQ PO TBCR
40.0000 meq | EXTENDED_RELEASE_TABLET | Freq: Two times a day (BID) | ORAL | 3 refills | Status: DC
  Filled 2023-02-12: qty 180, 45d supply, fill #0

## 2023-02-12 MED ORDER — HYDROCODONE-ACETAMINOPHEN 5-325 MG PO TABS
1.0000 | ORAL_TABLET | Freq: Four times a day (QID) | ORAL | 0 refills | Status: DC | PRN
  Filled 2023-02-12: qty 60, 8d supply, fill #0

## 2023-02-12 MED ORDER — TORSEMIDE 20 MG PO TABS
60.0000 mg | ORAL_TABLET | Freq: Two times a day (BID) | ORAL | 3 refills | Status: DC
  Filled 2023-02-12: qty 200, 33d supply, fill #0

## 2023-02-12 NOTE — Progress Notes (Signed)
[facility] Note   Date:  02/12/2023   ID:  Carl Tuy., DOB 1960-05-15, MRN [id number]   Provider location: [address] [place] Type of Visit:  Established patient  PCP:  [name], MD  Cardiologist:  Dr. Kirke Corin Nephrology: Dr. Yetta [name]  HF Cardiologist: Dr. Shirlee Latch  HPI: Carl Florin Feffer. is a 62 y.o. male  with a history of CAD s/p CABG 2005, chronic atrial fibrillation on Eliquis, ESRD s/p renal transplant 2012, anc chronic diastolic CHF.  His renal function has been fairly stable, most recent creatinine 1.43.  He has been struggling with volume overload/CHF.  He has a history of ischemic cardiomyopathy with EF as low as 35-40% in the past but echo in 3/20 showed EF 60-65%.  There is evidence for RV failure with pulmonary hypertension by echo.    Admitted 11/05/18 low grade fever and shortness of breath. HS Trop negative. Covid 19 negative. Blood CX negative. Placed on antibiotics and discharged to home the next day.   Admitted 05/03/19 with A/C Diastolic HF. Diuresed with IV lasix and transitioned to torsemide 80/40 mg . Discharge weight 252 pounds.   Admitted 06/2019 with left arm pain. Left upper extremity venous Doppler revealed occluded cephalic vein at the AV fistula outflow tract in the antecubital fossa with no other thrombus identified. Vascular evaluated. He continued on Eliquis.  No plan for intervention. Also treated for acute gout flare. Discharged on prednisone taper.     Given IV lasix on 12/21 by HF Paramedicine.   Admitted to [facility] 2/22 with acute gout flare vs cellulitis. Nephrology was consulted to evaluate transplant regimen and follow along. He was treated with antibiotics and prednisone. Discharged on decreased torsemide dose of 40 mg daily. Weight 259 lbs.  Admitted 4/22 for left leg cellulitis after sustaining a dog scratch, received IV antibiotics. No changes to his diuretic therapy. Discharge weight  266 lbs.  Follow up with Dr. Kirke Corin, 1/23, he had CP and underwent Lexiscan myoview, which overall was a low risk study with evidence of prior infarct in the infero-apical area with no significant change from before. Losartan added to GDMT regimen.  Follow up 5/23, volume overloaded, REDs 40%. Advised taking additional metolazone (3 doses that week), however labs showed worsening renal function and advised to keep at 2 doses.  Admitted in 5/23 with acute on chronic CHF and AKI.  Wonda Cerise and losartan stopped. Diuresed with IV lasix. Echo showed EF 40-45%, RV mildly reduced, GIIIDD (restrictive), IVC dilated. Nephrology consulted for diuretic dosing. Discharged home, weight 266 lbs.  He had chest pain in 8/23 and was admitted at [facility], minimal HS-TnI elevation with no trend, thought to be demand ischemia from volume overload.    Echo 8/23 showing EF 40-45%, diffuse hypokinesis with dyssynchrony, mildly dilated RV with mildly decreased systolic function, IVC dilated, moderate AS with mean gradient 15, mild-moderate mitral stenosis with mean gradient 6.   Follow up 03/27/22, volume overloaded with 25 lbs weight gain. Instructed to use Furoscix x 3 days, then resume home dose of torsemide + metolazone. Clinic weight 277 lbs.  Admitted from [facility] 04/01/22 with a/c CHF. Echo showed EF 40-45%, RV moderately reduced and RVSP 49 mmHg. RHC showed elevated filling pressures, moderate primarily pulmonary venous hypertension and preserved CO. Tadalafil stopped with primarily PH group 2 and 3. He was diuresed with IV lasix and metolazone, and GDMT added back. Overall down 19 lbs. He was  discharged home, weight 255 lbs.  Patient was admitted in 3/24 with Serratia bacteremia/pyelonephritis, treated with ertapenem.  He also had campylobacter diarrhea.  In 4/24, he tripped getting off his riding lawnmower and fell, fracturing ribs. He had a pulmonary contusion with this.   Patient was admitted with slurred speech and  profuse diarrhea in 11/24.  MRI brain showed no acute stroke.  He had AKI on CKD stage IV with creatinine up to 4.3, presentation thought to be due to diarrhea with dehydration related to use of Mounjaro.  He was given IV fluid and diuretics were stopped.  Losartan and spironolactone were stopped. He was sent home without diuretics.   Today he returns for HF follow up. Weight is trending up.  He reports abdominal fullness.  He has episodes of lightheadedness, HR is around 50 bpm today in AF.  No syncope. No dyspnea with usual activities and no orthopnea.   Using CPAP.  No chest pain.   ECG (personally reviewed): atrial fibrillation rate 53, IVCD 160 msec  Labs (7/19): LDL 49 Labs (2/20): K 3.7, creatinine 1.43 Labs (4/20): K 4.3, creatinine 1.24 Labs (11/06/18): K 3.7 creatinine 1.23  Labs (05/07/19): K 3.8 Creatinine 1.5  Labs (07/18/19): K 4.3 Creatinine 1.42  Labs (5/21): K 3.3, creatinine 1.21 Labs (11/21): K 3.6, creatinine 1.4 Labs (12/21): K 3.3, creatinine 2.4, hgb 12.8 Labs (04/01/19): K 3.3 Creatinine 2.2  Labs (8/22): K 4.0, creatinine 1.6, HDL 35, LDL 36, a1c 7.5 Labs (2/23): LDL 31, HDL 36 Labs (4/23): K 3.3, creatinine 1.68 Labs (5/23): K 4.1, creatinine 2.24 Labs (8/23): K 3.5, creatinine 2.36 Labs (12/23): K 3.6, creatinine 1.99, hgb 9.3 Labs (1/24): K 4.6, creatinine 2.44 Labs (2/24): K 3.4, creatinine 2.75, LDL 25 Labs (4/24): K 4.1, creatinine 2.46 Labs (5/24): K 2.7, creatinine 2.25, IgG monoclonal protein on myeloma panel.  Labs (6/24): K 3.8, creatinine 2.58 Labs (9/24): BNP 265, K 4.1, creatinine 2.83, LFTs normal, hgb 13.6 Labs (11/24): K 3.3, creatinine 4.3 => 1.88  PMH: 1. CAD: s/p CABG x 4 in [place] in 2005.  - Cardiolite (1/20): EF 47%, small fixed apical septal defect, no ischemia.  Low risk study.  - Cardiolite (12/22): small apical inferior defect, no changes from prior, no ischemia. Low risk study. 2. Asthma 3. Gout 4. Atrial fibrillation: Chronic.  5.  ESRD s/p renal transplantation at [facility] in 2012.  6. Anemia of chronic disease 7. Type II diabetes 8. HTN 9. Hyperlipidemia 10. OSA: On bipap.  11. Ischemic cardiomyopathy: EF 35-40% in past.  - Cardiolite (1/20) with EF 47%.  - Echo (1/20): EF 50-55%, mild MR, RV moderately dilated with moderately decreased systolic function, PASP 73 mmHg, moderate TR.   - Echo (3/20): EF 60-65%, PASP 75 mmHg, mildly dilated RV with mildly decreased systolic function.  - Echo (2/21): EF 45-50%, global HK, moderately dilated RV with moderately decreased systolic function, D-shaped interventricular septum, dilated IVC.  - RHC (2/21): mean RA 19, PA 78/31 mean 51, mean PCWP 26, CI 3.26, PVR 3.25 - Echo (5/23): EF 40-45%, RV mildly reduced, GIIIDD (restrictive) - Echo (8/23): EF 40-45%, diffuse hypokinesis with dyssynchrony, mildly dilated RV with mildly decreased systolic function, IVC dilated, moderate AS with mean gradient 15, mild-moderate mitral stenosis with mean gradient 6.  - Echo (1/24): Echo showed EF 40-45%, RV moderately reduced and RVSP 49 mmHg, mild MR, no AS, dilated IVC.  - RHC (1/24): RA mean 13, PA 62/24 (mean 40), PCWP mean 20, CO/CI (  Fick) 7.63/3.26, PVR 2.6 WU, PAPi 2.9 12. Obesity: s/p gastric bypass in 2015.  13. Bradycardia with beta blocker.  14. Aortic stenosis: Moderate on 8/23 echo.  15. Mitral stenosis: Mild to moderate on 8/23 echo.  16. Rheumatoid arthritis 17. IgG monoclonal gammopathy 18. PAD: ABIs (10/24) with noncompressible ABIs, normal TBIs.  19. H/o Bell's palsy  Past Surgical History:  Procedure Laterality Date   CARDIAC CATHETERIZATION N/A 08/10/2002   CATARACT EXTRACTION W/PHACO Right 10/29/2017   Procedure: CATARACT EXTRACTION PHACO AND INTRAOCULAR LENS PLACEMENT (IOC)  RIGHT DIABETIC;  Surgeon: Lockie Mola, MD;  Location: [facility];  Service: Ophthalmology;  Laterality: Right   CATARACT EXTRACTION W/PHACO Left 11/18/2017   Procedure:  CATARACT EXTRACTION PHACO AND INTRAOCULAR LENS PLACEMENT (IOC) LEFT IVA/TOPICAL;  Surgeon: Lockie Mola, MD;  Location: [facility];  Service: Ophthalmology;  Laterality: Left   COLONOSCOPY WITH PROPOFOL N/A 04/22/2013   Procedure: COLONOSCOPY WITH PROPOFOL;  Surgeon: Rachael Fee, MD;  Location: [facility];  Service: Endoscopy;  Laterality: N/A;   CORONARY ANGIOPLASTY WITH STENT PLACEMENT N/A 1996   CORONARY ARTERY BYPASS GRAFT N/A 08/13/2002   Procedure: 4v CORONARY ARTERY BYPASS GRAFTING; Location: [facility]; Surgeon: Katy Apo, MD   CYSTOSCOPY WITH INSERTION OF UROLIFT N/A 12/25/2021   Procedure: CYSTOSCOPY WITH INSERTION OF UROLIFT;  Surgeon: Riki Altes, MD;  Location: [facility];  Service: Urology;  Laterality: N/A;   DG ANGIO AV SHUNT*L*     right and left upper arms   FASCIOTOMY  03/03/2012   Procedure: FASCIOTOMY;  Surgeon: Nicki Reaper, MD;  Location: [facility];  Service: Orthopedics;  Laterality: Right;  FASCIOTOMY RIGHT SMALL FINGER   FASCIOTOMY Left 08/17/2013   Procedure: FASCIOTOMY LEFT RING;  Surgeon: Nicki Reaper, MD;  Location: [facility];  Service: Orthopedics;  Laterality: Left;   INCISION AND DRAINAGE ABSCESS Left 10/15/2015   Procedure: INCISION AND DRAINAGE ABSCESS;  Surgeon: Leafy Ro, MD;  Location: [facility];  Service: General;  Laterality: Left;   KIDNEY TRANSPLANT  09/13/2010   cadaver--at [facility]   LASER ABLATION CONDOLAMATA N/A 01/08/2023   Procedure: LASER REMOVAL ABLATION OF CONDYLOMATA;  Surgeon: Karie Soda, MD;  Location: [facility];  Service: General;  Laterality: N/A;  GEN AND LOCAL   RECTAL EXAM UNDER ANESTHESIA N/A 01/08/2023   Procedure: ANORECTAL EXAM UNDER ANESTHESIA;  Surgeon: Karie Soda, MD;  Location: [facility];  Service: General;  Laterality: N/A;   RIGHT HEART CATH N/A 11/15/2016   Procedure: RIGHT HEART CATH;  Surgeon: Iran Ouch, MD;  Location: [facility];  Service:  Cardiovascular;  Laterality: N/A;   RIGHT HEART CATH N/A 05/03/2019   Procedure: RIGHT HEART CATH;  Surgeon: Laurey Morale, MD;  Location: [facility] CV [facility];  Service: Cardiovascular;  Laterality: N/A;   RIGHT HEART CATH N/A 04/04/2022   Procedure: RIGHT HEART CATH;  Surgeon: Laurey Morale, MD;  Location: [facility] INVASIVE CV LAB;  Service: Cardiovascular;  Laterality: N/A;   ROUX-EN-Y GASTRIC BYPASS N/A 2015   TYMPANIC MEMBRANE REPAIR Left 03/2010   VASECTOMY     WART FULGURATION N/A 05/31/2022   Procedure: FULGURATION ANAL WART;  Surgeon: Sung Amabile, DO;  Location: [facility];  Service: General;  Laterality: N/A;   Current Outpatient Medications  Medication Sig Dispense Refill   albuterol (PROVENTIL) (2.5 MG/3ML) 0.083% nebulizer solution Take 3 mLs (2.5 mg total) by nebulization every 6 (six) hours as needed for wheezing or shortness  of breath. 150 mL 0   albuterol (VENTOLIN HFA) 108 (90 Base) MCG/ACT inhaler Inhale 2 puffs into the lungs every 6 (six) hours as needed for wheezing or shortness of breath. 6.7 g 5   apixaban (ELIQUIS) 5 MG TABS tablet Take 1 tablet (5 mg total) by mouth 2 (two) times daily. 180 tablet 3   buPROPion (WELLBUTRIN SR) 150 MG 12 hr tablet Take 1 tablet (150 mg total) by mouth 2 (two) times daily. 180 tablet 3   cholecalciferol (VITAMIN D3) 25 MCG (1000 UNIT) tablet Take 1,000 Units by mouth at bedtime.     colchicine 0.6 MG tablet Take 1 tablet (0.6 mg total) by mouth every other day. 45 tablet 1   Continuous Glucose Sensor (FREESTYLE LIBRE 3 SENSOR) MISC Use one every 2 weeks. 6 each 3   dapagliflozin propanediol (FARXIGA) 10 MG TABS tablet Take 1 tablet (10 mg total) by mouth daily before breakfast. 90 tablet 3   febuxostat (ULORIC) 40 MG tablet Take 3 tablets (120 mg total) by mouth daily. 90 tablet 5   fluticasone (FLONASE) 50 MCG/ACT nasal spray Place 2 sprays into both nostrils daily. 16 g 11   gabapentin (NEURONTIN) 300 MG capsule Take 1 capsule (300 mg  total) by mouth 2 (two) times daily. 180 capsule 0   HYDROcodone-acetaminophen (NORCO/VICODIN) 5-325 MG tablet Take 1-2 tablets by mouth every 6 (six) hours as needed for moderate pain (pain score 4-6). 60 tablet 0   hydrocortisone 2.5 % cream Apply topically 3 (three) times daily as needed. 30 g 3   Insulin Glargine (SEMGLEE [place]) Inject 20 Units into the skin daily.     insulin lispro (HUMALOG KWIKPEN) 100 UNIT/ML KwikPen Inject 10-20 Units into the skin 3 (three) times daily. Per sliding scale     Insulin Pen Needle (TECHLITE PEN NEEDLES) 31G X 5 MM MISC 4 (four) times daily. 400 each 3   Insulin Pen Needle (TECHLITE PEN NEEDLES) 32G X 6 MM MISC Use 4 times a day 100 each 3   Insulin Pen Needle (UNIFINE PENTIPS) 31G X 5 MM MISC Use 4 times daily as directed 400 each 3   lidocaine (LIDODERM) 5 % Place 1 patch onto the skin daily.PLACE 1 PATCH ONTO THE SKIN FOR 12 (TWELVE) HOURS. REMOVE & DISCARD PATCH WITHIN 12 HOURS OR AS DIRECTED BY MD 60 patch 11   montelukast (SINGULAIR) 10 MG tablet Take 1 tablet (10 mg total) by mouth at bedtime. 30 tablet 11   Multiple Vitamin (MULTIVITAMIN WITH MINERALS) TABS tablet Take 1 tablet by mouth daily.     mycophenolate (MYFORTIC) 180 MG EC tablet Take 2 tablets (360 mg total) by mouth 2 (two) times daily. 360 tablet 3   omeprazole (PRILOSEC) 20 MG capsule Take 1 capsule (20 mg total) by mouth daily. 30 capsule 11   potassium chloride SA (KLOR-CON M) 20 MEQ tablet Take 2 tablets (40 mEq total) by mouth 2 (two) times daily. 180 tablet 3   predniSONE (DELTASONE) 5 MG tablet Take 1 tablet (5 mg total) by mouth daily. 90 tablet 3   rosuvastatin (CRESTOR) 10 MG tablet Take 1 tablet (10 mg total) by mouth daily. 90 tablet 3   tacrolimus (PROGRAF) 1 MG capsule Take 2 capsules (2 mg total) by mouth 2 times daily. 120 capsule 11   tamsulosin (FLOMAX) 0.4 MG CAPS capsule Take 1 capsule (0.4 mg total) by mouth daily. 30 capsule 11   Testosterone 20.25 MG/ACT (1.62%) GEL  Place 2 Pump  onto the skin daily. 150 g 3   torsemide (DEMADEX) 20 MG tablet Take 3 tablets (60 mg total) by mouth 2 (two) times daily. 200 tablet 3   zinc sulfate 220 (50 Zn) MG capsule Take 220 mg by mouth daily.     No current facility-administered medications for this encounter.   Allergies:   Contrast media [iodinated contrast media], Iodine, Nsaids, and Ibuprofen   Social History:  The patient  reports that he quit smoking about 28 years ago. His smoking use included cigars. He has never been exposed to tobacco smoke. He has never used smokeless tobacco. He reports that he does not currently use alcohol. He reports that he does not use drugs.   Family History:  The patient's family history includes Breast cancer in his maternal grandmother; Diabetes in his maternal grandmother; Heart disease in his father; Kidney disease in his father; Kidney failure in his father; Liver cancer in his paternal grandmother and paternal uncle; Valvular heart disease in his mother.   ROS:  Please see the history of present illness.   All other systems are personally reviewed and negative.   Recent Labs: 10/21/2022: NT-Pro BNP 4,402 01/23/2023: B Natriuretic Peptide 163.3 02/03/2023: ALT 15 02/05/2023: Magnesium 2.7 02/11/2023: BUN 70; Creatinine, Ser 1.88; Hemoglobin 12.7; Platelets 133.0; Potassium 3.3; Sodium 142; TSH 2.31  Personally reviewed   Wt Readings from Last 3 Encounters:  02/12/23 116 kg (255 lb 12.8 oz)  02/11/23 115.7 kg (255 lb)  02/04/23 112 kg (247 lb)    BP 130/70   Pulse (!) 44   Wt 116 kg (255 lb 12.8 oz)   SpO2 98%   BMI 36.70 kg/m  General: NAD Neck: JVP 8-9 cm with HJR, no thyromegaly or thyroid nodule.  Lungs: Clear to auscultation bilaterally with normal respiratory effort. CV: Nondisplaced PMI.  Heart irregular S1/S2, no S3/S4, no murmur.  Trace ankle edema.  No carotid bruit.  Normal pedal pulses.  Abdomen: Soft, nontender, no hepatosplenomegaly, no distention.   Skin: Intact without lesions or rashes.  Neurologic: Alert and oriented x 3.  Psych: Normal affect. Extremities: No clubbing or cyanosis.  HEENT: Normal.   Assessment & Plan: 1. Chronic heart failure with Mid Range EF/ With prominent RV failure/pulmonary hypertension:  Ischemic cardiomyopathy.  ?If RV failure is related to severe OSA, he was a remote smoker. Echo in 2/21 with EF 45-50%, moderately dilated RV with moderately decreased systolic function, moderate pulmonary hypertension. Echo in 1/24 showed EF 40-45% , RV mod reduced, RVSP 49 mmHg, no AS, mild MR. Management complicated by CKD stage 4.  He is volume overloaded on exam after having diuretics stopped in the setting of diarrhea with AKI likely due to [facility] use. NYHA class II-III.    - Restart torsemide at 60 mg bid and KCl at 40 mEq bid, BMET/BNP in 1 week. Will hold off on metolazone for now.  He may need a more aggressive diuretic regimen but will start with this.   - He will stay off losartan and spironolactone with CKD stage 4.  - Continue Farxiga 10 mg daily. Will need to watch closely for further UTIs.  Had recent Serratia pyelonephritis.  - Tadalafil was stopped due to group 2 and 3 PH (not group 1).  - I will arrange for repeat echo.  2. Atrial fibrillation: Chronic. Rate controlled.  - Continue Eliquis.  - Not on beta blocker with history of bradycardia.   3. CAD: S/p CABG 2005.  Cardiolite 12/22  with prior MI, no ischemia.  No chest pain. High threshold for cath given CKD.  - No ASA given Eliquis use.  - Continue Crestor 10 mg daily.  4. CKD Stage 4: S/p renal transplantation in 2012. Followed by [facility] center at [facility]. He remains on mycophenolate and tacrolimus.  Recent AKI in the setting of Mounjaro use with diarrhea.  5. OSA: Continue Bipap every night.  6. HTN: BP not elevated.  Currently off hydralazine/Imdur, spironolactone, and losartan.    7. DM 2: On insulin. He follows with Endocrinology. -  Continue dapagliflozin for now but watch for further GU symptoms. - Avoid Mounjaro with diarrhea and AKI.  8. Pulmonary hypertension: Primarily pulmonary venous hypertension by RHC in 1/24 though there may be group 3 PH component from OSA.  - Now off tadalafil.  9. Chronic Anemia: has been getting scheduled weekly IV Fe infusions, q Fridays 10. IgG monoclonal protein: Needs followup with his hematologist.  11. Bradycardia: Patient has episodes of lightheadedness, no syncope. HR in 40s-50s at times.  Suspect tachy-brady syndrome.  - I will arrange for 14 day Zio monitor to assess for significant bradyarrhythmias.  - I will not use nodal blockers.    Followup next week at [facility] office to reassess volume status.   Marca Ancona 02/12/2023

## 2023-02-12 NOTE — Progress Notes (Signed)
Zio patch placed onto patient.  All instructions and information reviewed with patient, they verbalize understanding with no questions. 

## 2023-02-12 NOTE — Progress Notes (Signed)
ReDS Vest / Clip - 02/12/23 0900       ReDS Vest / Clip   Station Marker D    Ruler Value 38    ReDS Value Range Moderate volume overload    ReDS Actual Value 38

## 2023-02-12 NOTE — Patient Instructions (Addendum)
RESTART Torsemide 60 mg Twice daily  RESTART Potassium 40 mEq ( 2 Tabs) Twice daily  Labs done today, your results will be available in MyChart, we will contact you for abnormal readings.  Your provider has recommended that  you wear a Zio Patch for 14 days.  This monitor will record your heart rhythm for our review.  IF you have any symptoms while wearing the monitor please press the button.  If you have any issues with the patch or you notice a red or orange light on it please call the company at [phone].  Once you remove the patch please mail it back to the company as soon as possible so we can get the results.   Your physician has requested that you have an echocardiogram. Echocardiography is a painless test that uses sound waves to create images of your heart. It provides your doctor with information about the size and shape of your heart and how well your heart's chambers and valves are working. This procedure takes approximately one hour. There are no restrictions for this procedure. Please do NOT wear cologne, perfume, aftershave, or lotions (deodorant is allowed). Please arrive 15 minutes prior to your appointment time.  Please note: We ask at that you not bring children with you during ultrasound (echo/ vascular) testing. Due to room size and safety concerns, children are not allowed in the ultrasound rooms during exams. Our front office staff cannot provide observation of children in our lobby area while testing is being conducted. An adult accompanying a patient to their appointment will only be allowed in the ultrasound room at the discretion of the ultrasound technician under special circumstances. We apologize for any inconvenience.   Your physician recommends that you schedule a follow-up appointment in: 1 week at the [facility] office   If you have any questions or concerns before your next appointment please send [name] a message through [place] or call our office at [phone].     TO LEAVE A MESSAGE FOR THE NURSE SELECT OPTION 2, PLEASE LEAVE A MESSAGE INCLUDING: YOUR NAME DATE OF BIRTH CALL BACK NUMBER REASON FOR CALL**this is important as we prioritize the call backs  YOU WILL RECEIVE A CALL BACK THE SAME DAY AS LONG AS YOU CALL BEFORE 4:00 PM  At the [facility], you and your health needs are our priority. As part of our continuing mission to provide you with exceptional heart care, we have created designated Provider Care Teams. These Care Teams include your primary Cardiologist (physician) and Advanced Practice Providers (APPs- Physician Assistants and Nurse Practitioners) who all work together to provide you with the care you need, when you need it.   You may see any of the following providers on your designated Care Team at your next follow up: Dr Arvilla Meres Dr Marca Ancona Dr. Dorthula Nettles Dr. Clearnce Hasten Amy Filbert Schilder, NP Robbie Lis, [place] [facility] [place], [place] Brynda Peon, NP [place] Lee, NP Karle Plumber, PharmD   Please be sure to bring in all your medications bottles to every appointment.    Thank you for choosing [facility]

## 2023-02-13 ENCOUNTER — Other Ambulatory Visit: Payer: Self-pay | Admitting: Student

## 2023-02-13 ENCOUNTER — Other Ambulatory Visit: Payer: Self-pay

## 2023-02-13 DIAGNOSIS — E876 Hypokalemia: Secondary | ICD-10-CM

## 2023-02-13 DIAGNOSIS — I5042 Chronic combined systolic (congestive) and diastolic (congestive) heart failure: Secondary | ICD-10-CM

## 2023-02-14 ENCOUNTER — Other Ambulatory Visit: Payer: Self-pay

## 2023-02-14 DIAGNOSIS — Z79621 Long term (current) use of calcineurin inhibitor: Secondary | ICD-10-CM | POA: Diagnosis not present

## 2023-02-14 DIAGNOSIS — N401 Enlarged prostate with lower urinary tract symptoms: Secondary | ICD-10-CM | POA: Diagnosis not present

## 2023-02-14 DIAGNOSIS — E876 Hypokalemia: Secondary | ICD-10-CM | POA: Diagnosis not present

## 2023-02-14 DIAGNOSIS — E1122 Type 2 diabetes mellitus with diabetic chronic kidney disease: Secondary | ICD-10-CM | POA: Diagnosis not present

## 2023-02-14 DIAGNOSIS — Z5181 Encounter for therapeutic drug level monitoring: Secondary | ICD-10-CM | POA: Diagnosis not present

## 2023-02-14 DIAGNOSIS — D84821 Immunodeficiency due to drugs: Secondary | ICD-10-CM | POA: Diagnosis not present

## 2023-02-14 DIAGNOSIS — E559 Vitamin D deficiency, unspecified: Secondary | ICD-10-CM | POA: Diagnosis not present

## 2023-02-14 DIAGNOSIS — I10 Essential (primary) hypertension: Secondary | ICD-10-CM | POA: Diagnosis not present

## 2023-02-14 DIAGNOSIS — Z940 Kidney transplant status: Secondary | ICD-10-CM | POA: Diagnosis not present

## 2023-02-14 DIAGNOSIS — Z4822 Encounter for aftercare following kidney transplant: Secondary | ICD-10-CM | POA: Diagnosis not present

## 2023-02-14 DIAGNOSIS — I129 Hypertensive chronic kidney disease with stage 1 through stage 4 chronic kidney disease, or unspecified chronic kidney disease: Secondary | ICD-10-CM | POA: Diagnosis not present

## 2023-02-14 DIAGNOSIS — D849 Immunodeficiency, unspecified: Secondary | ICD-10-CM | POA: Diagnosis not present

## 2023-02-14 DIAGNOSIS — D631 Anemia in chronic kidney disease: Secondary | ICD-10-CM | POA: Diagnosis not present

## 2023-02-14 DIAGNOSIS — N1832 Chronic kidney disease, stage 3b: Secondary | ICD-10-CM | POA: Diagnosis not present

## 2023-02-14 DIAGNOSIS — N2581 Secondary hyperparathyroidism of renal origin: Secondary | ICD-10-CM | POA: Diagnosis not present

## 2023-02-14 DIAGNOSIS — N184 Chronic kidney disease, stage 4 (severe): Secondary | ICD-10-CM | POA: Diagnosis not present

## 2023-02-14 DIAGNOSIS — Z79899 Other long term (current) drug therapy: Secondary | ICD-10-CM | POA: Diagnosis not present

## 2023-02-14 MED ORDER — MOUNJARO 2.5 MG/0.5ML ~~LOC~~ SOAJ
2.5000 mg | SUBCUTANEOUS | 1 refills | Status: DC
  Filled 2023-02-14: qty 6, 84d supply, fill #0
  Filled 2023-02-14: qty 2, 28d supply, fill #0
  Filled 2023-02-17: qty 6, 84d supply, fill #0
  Filled 2023-03-25: qty 6, 84d supply, fill #1

## 2023-02-14 MED ORDER — SULFAMETHOXAZOLE-TRIMETHOPRIM 400-80 MG PO TABS
1.0000 | ORAL_TABLET | ORAL | 3 refills | Status: DC
  Filled 2023-02-14 – 2023-04-19 (×3): qty 36, 84d supply, fill #0
  Filled 2023-07-04: qty 36, 84d supply, fill #1
  Filled 2023-10-06: qty 36, 84d supply, fill #2

## 2023-02-14 MED ORDER — AMOXICILLIN 500 MG PO CAPS
2000.0000 mg | ORAL_CAPSULE | ORAL | 5 refills | Status: DC
  Filled 2023-02-14: qty 4, 1d supply, fill #0
  Filled 2023-03-12: qty 4, 1d supply, fill #1
  Filled 2023-03-24: qty 4, 1d supply, fill #2
  Filled 2023-04-19: qty 4, 1d supply, fill #3
  Filled 2023-05-01: qty 4, 1d supply, fill #4
  Filled 2023-05-14: qty 4, 1d supply, fill #5

## 2023-02-14 MED FILL — Montelukast Sodium Tab 10 MG (Base Equiv): ORAL | 30 days supply | Qty: 30 | Fill #1 | Status: AC

## 2023-02-14 MED FILL — Omeprazole Cap Delayed Release 20 MG: ORAL | 30 days supply | Qty: 30 | Fill #1 | Status: AC

## 2023-02-17 ENCOUNTER — Other Ambulatory Visit: Payer: Self-pay

## 2023-02-18 ENCOUNTER — Other Ambulatory Visit: Payer: Self-pay

## 2023-02-18 DIAGNOSIS — D631 Anemia in chronic kidney disease: Secondary | ICD-10-CM

## 2023-02-19 ENCOUNTER — Ambulatory Visit: Payer: Commercial Managed Care - PPO | Attending: Cardiology | Admitting: Cardiology

## 2023-02-19 ENCOUNTER — Inpatient Hospital Stay: Payer: Commercial Managed Care - PPO | Attending: Oncology

## 2023-02-19 ENCOUNTER — Encounter: Payer: Self-pay | Admitting: Oncology

## 2023-02-19 ENCOUNTER — Inpatient Hospital Stay (HOSPITAL_BASED_OUTPATIENT_CLINIC_OR_DEPARTMENT_OTHER): Payer: Commercial Managed Care - PPO | Admitting: Oncology

## 2023-02-19 ENCOUNTER — Encounter: Payer: Self-pay | Admitting: Cardiology

## 2023-02-19 VITALS — BP 139/68 | HR 50 | Temp 96.2°F | Resp 18 | Wt 254.9 lb

## 2023-02-19 VITALS — BP 153/73 | HR 50 | Wt 256.0 lb

## 2023-02-19 DIAGNOSIS — I5042 Chronic combined systolic (congestive) and diastolic (congestive) heart failure: Secondary | ICD-10-CM

## 2023-02-19 DIAGNOSIS — N184 Chronic kidney disease, stage 4 (severe): Secondary | ICD-10-CM

## 2023-02-19 DIAGNOSIS — D631 Anemia in chronic kidney disease: Secondary | ICD-10-CM | POA: Insufficient documentation

## 2023-02-19 LAB — IRON AND TIBC
Iron: 55 ug/dL (ref 45–182)
Saturation Ratios: 19 % (ref 17.9–39.5)
TIBC: 287 ug/dL (ref 250–450)
UIBC: 232 ug/dL

## 2023-02-19 LAB — CBC WITH DIFFERENTIAL (CANCER CENTER ONLY)
Abs Immature Granulocytes: 0.01 10*3/uL (ref 0.00–0.07)
Basophils Absolute: 0 10*3/uL (ref 0.0–0.1)
Basophils Relative: 1 %
Eosinophils Absolute: 0 10*3/uL (ref 0.0–0.5)
Eosinophils Relative: 0 %
HCT: 38.4 % — ABNORMAL LOW (ref 39.0–52.0)
Hemoglobin: 12.2 g/dL — ABNORMAL LOW (ref 13.0–17.0)
Immature Granulocytes: 0 %
Lymphocytes Relative: 23 %
Lymphs Abs: 1 10*3/uL (ref 0.7–4.0)
MCH: 28 pg (ref 26.0–34.0)
MCHC: 31.8 g/dL (ref 30.0–36.0)
MCV: 88.3 fL (ref 80.0–100.0)
Monocytes Absolute: 0.4 10*3/uL (ref 0.1–1.0)
Monocytes Relative: 8 %
Neutro Abs: 3 10*3/uL (ref 1.7–7.7)
Neutrophils Relative %: 68 %
Platelet Count: 141 10*3/uL — ABNORMAL LOW (ref 150–400)
RBC: 4.35 MIL/uL (ref 4.22–5.81)
RDW: 13.5 % (ref 11.5–15.5)
WBC Count: 4.5 10*3/uL (ref 4.0–10.5)
nRBC: 0 % (ref 0.0–0.2)

## 2023-02-19 LAB — FERRITIN: Ferritin: 107 ng/mL (ref 24–336)

## 2023-02-19 MED ORDER — TORSEMIDE 20 MG PO TABS: 80.0000 mg | ORAL_TABLET | Freq: Two times a day (BID) | ORAL | Status: DC

## 2023-02-19 MED ORDER — POTASSIUM CHLORIDE CRYS ER 20 MEQ PO TBCR: 60.0000 meq | EXTENDED_RELEASE_TABLET | Freq: Two times a day (BID) | ORAL | Status: DC

## 2023-02-19 NOTE — Patient Instructions (Addendum)
INCREASE Torsemide to 80mg  twice daily  INCREASE Potassium to twice daily  Labs in 10 days  Follow up in 6 weeks with Dr.McLean   Do the following things EVERYDAY: Weigh yourself in the morning before breakfast. Write it down and keep it in a log. Take your medicines as prescribed Eat low salt foods--Limit salt (sodium) to 2000 mg per day.  Stay as active as you can everyday Limit all fluids for the day to less than 2 liters

## 2023-02-21 ENCOUNTER — Other Ambulatory Visit: Payer: Self-pay

## 2023-02-22 NOTE — Progress Notes (Signed)
Advanced Heart Failure Clinic Note   Date:  02/22/2023   ID:  [name]., DOB [birth year], MRN [id number]   Provider location: [address], [place] Type of Visit:  Established patient  PCP:  Carl Schwalbe, MD  Cardiologist:  Dr. Kirke Corin Nephrology: Dr. Yetta [name]  HF Cardiologist: Dr. Shirlee Latch  HPI: [name]. is a 62 y.o. male  with a history of CAD s/p CABG 2005, chronic atrial fibrillation on Eliquis, ESRD s/p renal transplant 2012, anc chronic diastolic CHF.  His renal function has been fairly stable, most recent creatinine 1.43.  He has been struggling with volume overload/CHF.  He has a history of ischemic cardiomyopathy with EF as low as 35-40% in the past but echo in 3/20 showed EF 60-65%.  There is evidence for RV failure with pulmonary hypertension by echo.    Admitted 11/05/18 low grade fever and shortness of breath. HS Trop negative. Covid 19 negative. Blood CX negative. Placed on antibiotics and discharged to home the next day.   Admitted 05/03/19 with A/C Diastolic HF. Diuresed with IV lasix and transitioned to torsemide 80/40 mg . Discharge weight 252 pounds.   Admitted 06/2019 with left arm pain. Left upper extremity venous Doppler revealed occluded cephalic vein at the AV fistula outflow tract in the antecubital fossa with no other thrombus identified. Vascular evaluated. He continued on Eliquis.  No plan for intervention. Also treated for acute gout flare. Discharged on prednisone taper.     Given IV lasix on 12/21 by HF Paramedicine.   Admitted to [facility] 2/22 with acute gout flare vs cellulitis. Nephrology was consulted to evaluate transplant regimen and follow along. He was treated with antibiotics and prednisone. Discharged on decreased torsemide dose of 40 mg daily. Weight 259 lbs.  Admitted 4/22 for left leg cellulitis after sustaining a dog scratch, received IV antibiotics. No changes to his diuretic therapy. Discharge weight  266 lbs.  Follow up with Dr. Kirke Corin, 1/23, he had CP and underwent Lexiscan myoview, which overall was a low risk study with evidence of prior infarct in the infero-apical area with no significant change from before. Losartan added to GDMT regimen.  Follow up 5/23, volume overloaded, REDs 40%. Advised taking additional metolazone (3 doses that week), however labs showed worsening renal function and advised to keep at 2 doses.  Admitted in 5/23 with acute on chronic CHF and AKI.  Wonda Cerise and losartan stopped. Diuresed with IV lasix. Echo showed EF 40-45%, RV mildly reduced, GIIIDD (restrictive), IVC dilated. Nephrology consulted for diuretic dosing. Discharged home, weight 266 lbs.  He had chest pain in 8/23 and was admitted at [facility], minimal HS-TnI elevation with no trend, thought to be demand ischemia from volume overload.    Echo 8/23 showing EF 40-45%, diffuse hypokinesis with dyssynchrony, mildly dilated RV with mildly decreased systolic function, IVC dilated, moderate AS with mean gradient 15, mild-moderate mitral stenosis with mean gradient 6.   Follow up 03/27/22, volume overloaded with 25 lbs weight gain. Instructed to use Furoscix x 3 days, then resume home dose of torsemide + metolazone. Clinic weight 277 lbs.  Admitted from [facility] 04/01/22 with a/c CHF. Echo showed EF 40-45%, RV moderately reduced and RVSP 49 mmHg. RHC showed elevated filling pressures, moderate primarily pulmonary venous hypertension and preserved CO. Tadalafil stopped with primarily PH group 2 and 3. He was diuresed with IV lasix and metolazone, and GDMT added back. Overall down 19 lbs. He was  discharged home, weight 255 lbs.  Patient was admitted in 3/24 with Serratia bacteremia/pyelonephritis, treated with ertapenem.  He also had campylobacter diarrhea.  In 4/24, he tripped getting off his riding lawnmower and fell, fracturing ribs. He had a pulmonary contusion with this.   Patient was admitted with slurred speech and  profuse diarrhea in 11/24.  MRI brain showed no acute stroke.  He had AKI on CKD stage IV with creatinine up to 4.3, presentation thought to be due to diarrhea with dehydration related to use of Mounjaro.  He was given IV fluid and diuretics were stopped.  Losartan and spironolactone were stopped. He was sent home without diuretics.   Today he returns for HF follow up. Weight is up 1 lb.  He is currently taking torsemide 60 mg bid.  He denies dyspnea walking on flat ground.  He put up Christmas decorations, carrying a lot of boxes, with no problems.  No orthopnea/PND.  1 episode of lightheadedness, no syncope.  He is wearing his Zio monitor.   Labs (7/19): LDL 49 Labs (2/20): K 3.7, creatinine 1.43 Labs (4/20): K 4.3, creatinine 1.24 Labs (11/06/18): K 3.7 creatinine 1.23  Labs (05/07/19): K 3.8 Creatinine 1.5  Labs (07/18/19): K 4.3 Creatinine 1.42  Labs (5/21): K 3.3, creatinine 1.21 Labs (11/21): K 3.6, creatinine 1.4 Labs (12/21): K 3.3, creatinine 2.4, hgb 12.8 Labs (04/01/19): K 3.3 Creatinine 2.2  Labs (8/22): K 4.0, creatinine 1.6, HDL 35, LDL 36, a1c 7.5 Labs (2/23): LDL 31, HDL 36 Labs (4/23): K 3.3, creatinine 1.68 Labs (5/23): K 4.1, creatinine 2.24 Labs (8/23): K 3.5, creatinine 2.36 Labs (12/23): K 3.6, creatinine 1.99, hgb 9.3 Labs (1/24): K 4.6, creatinine 2.44 Labs (2/24): K 3.4, creatinine 2.75, LDL 25 Labs (4/24): K 4.1, creatinine 2.46 Labs (5/24): K 2.7, creatinine 2.25, IgG monoclonal protein on myeloma panel.  Labs (6/24): K 3.8, creatinine 2.58 Labs (9/24): BNP 265, K 4.1, creatinine 2.83, LFTs normal, hgb 13.6 Labs (11/24): K 3.3 => 3.7, creatinine 4.3 => 1.88 => 2.08  PMH: 1. CAD: s/p CABG x 4 in [place] in 2005.  - Cardiolite (1/20): EF 47%, small fixed apical septal defect, no ischemia.  Low risk study.  - Cardiolite (12/22): small apical inferior defect, no changes from prior, no ischemia. Low risk study. 2. Asthma 3. Gout 4. Atrial fibrillation: Chronic.  5.  ESRD s/p renal transplantation at [facility] in 2012.  6. Anemia of chronic disease 7. Type II diabetes 8. HTN 9. Hyperlipidemia 10. OSA: On bipap.  11. Ischemic cardiomyopathy: EF 35-40% in past.  - Cardiolite (1/20) with EF 47%.  - Echo (1/20): EF 50-55%, mild MR, RV moderately dilated with moderately decreased systolic function, PASP 73 mmHg, moderate TR.   - Echo (3/20): EF 60-65%, PASP 75 mmHg, mildly dilated RV with mildly decreased systolic function.  - Echo (2/21): EF 45-50%, global HK, moderately dilated RV with moderately decreased systolic function, D-shaped interventricular septum, dilated IVC.  - RHC (2/21): mean RA 19, PA 78/31 mean 51, mean PCWP 26, CI 3.26, PVR 3.25 - Echo (5/23): EF 40-45%, RV mildly reduced, GIIIDD (restrictive) - Echo (8/23): EF 40-45%, diffuse hypokinesis with dyssynchrony, mildly dilated RV with mildly decreased systolic function, IVC dilated, moderate AS with mean gradient 15, mild-moderate mitral stenosis with mean gradient 6.  - Echo (1/24): Echo showed EF 40-45%, RV moderately reduced and RVSP 49 mmHg, mild MR, no AS, dilated IVC.  - RHC (1/24): RA mean 13, PA 62/24 (mean 40),  PCWP mean 20, CO/CI (Fick) 7.63/3.26, PVR 2.6 WU, PAPi 2.9 12. Obesity: s/p gastric bypass in 2015.  13. Bradycardia with beta blocker.  14. Aortic stenosis: Moderate on 8/23 echo.  15. Mitral stenosis: Mild to moderate on 8/23 echo.  16. Rheumatoid arthritis 17. IgG monoclonal gammopathy 18. PAD: ABIs (10/24) with noncompressible ABIs, normal TBIs.  19. H/o Bell's palsy  Past Surgical History:  Procedure Laterality Date   CARDIAC CATHETERIZATION N/A 08/10/2002   CATARACT EXTRACTION W/PHACO Right 10/29/2017   Procedure: CATARACT EXTRACTION PHACO AND INTRAOCULAR LENS PLACEMENT (IOC)  RIGHT DIABETIC;  Surgeon: Lockie Mola, MD;  Location: [facility];  Service: Ophthalmology;  Laterality: Right   CATARACT EXTRACTION W/PHACO Left 11/18/2017   Procedure:  CATARACT EXTRACTION PHACO AND INTRAOCULAR LENS PLACEMENT (IOC) LEFT IVA/TOPICAL;  Surgeon: Lockie Mola, MD;  Location: [facility];  Service: Ophthalmology;  Laterality: Left   COLONOSCOPY WITH PROPOFOL N/A 04/22/2013   Procedure: COLONOSCOPY WITH PROPOFOL;  Surgeon: Rachael Fee, MD;  Location: [facility];  Service: Endoscopy;  Laterality: N/A;   CORONARY ANGIOPLASTY WITH STENT PLACEMENT N/A 1996   CORONARY ARTERY BYPASS GRAFT N/A 08/13/2002   Procedure: 4v CORONARY ARTERY BYPASS GRAFTING; Location: [facility]; Surgeon: Katy Apo, MD   CYSTOSCOPY WITH INSERTION OF UROLIFT N/A 12/25/2021   Procedure: CYSTOSCOPY WITH INSERTION OF UROLIFT;  Surgeon: Riki Altes, MD;  Location: [facility];  Service: Urology;  Laterality: N/A;   DG ANGIO AV SHUNT*L*     right and left upper arms   FASCIOTOMY  03/03/2012   Procedure: FASCIOTOMY;  Surgeon: Nicki Reaper, MD;  Location: [facility];  Service: Orthopedics;  Laterality: Right;  FASCIOTOMY RIGHT SMALL FINGER   FASCIOTOMY Left 08/17/2013   Procedure: FASCIOTOMY LEFT RING;  Surgeon: Nicki Reaper, MD;  Location: [facility];  Service: Orthopedics;  Laterality: Left;   INCISION AND DRAINAGE ABSCESS Left 10/15/2015   Procedure: INCISION AND DRAINAGE ABSCESS;  Surgeon: Leafy Ro, MD;  Location: [facility];  Service: General;  Laterality: Left;   KIDNEY TRANSPLANT  09/13/2010   cadaver--at [facility]   LASER ABLATION CONDOLAMATA N/A 01/08/2023   Procedure: LASER REMOVAL ABLATION OF CONDYLOMATA;  Surgeon: Carl Soda, MD;  Location: [facility];  Service: General;  Laterality: N/A;  GEN AND LOCAL   RECTAL EXAM UNDER ANESTHESIA N/A 01/08/2023   Procedure: ANORECTAL EXAM UNDER ANESTHESIA;  Surgeon: Carl Soda, MD;  Location: [facility];  Service: General;  Laterality: N/A;   RIGHT HEART CATH N/A 11/15/2016   Procedure: RIGHT HEART CATH;  Surgeon: Iran Ouch, MD;  Location: [facility];  Service:  Cardiovascular;  Laterality: N/A;   RIGHT HEART CATH N/A 05/03/2019   Procedure: RIGHT HEART CATH;  Surgeon: Laurey Morale, MD;  Location: [facility];  Service: Cardiovascular;  Laterality: N/A;   RIGHT HEART CATH N/A 04/04/2022   Procedure: RIGHT HEART CATH;  Surgeon: Laurey Morale, MD;  Location: [facility] CV LAB;  Service: Cardiovascular;  Laterality: N/A;   ROUX-EN-Y GASTRIC BYPASS N/A 2015   TYMPANIC MEMBRANE REPAIR Left 03/2010   VASECTOMY     WART FULGURATION N/A 05/31/2022   Procedure: FULGURATION ANAL WART;  Surgeon: Sung Amabile, DO;  Location: [facility];  Service: General;  Laterality: N/A;   Current Outpatient Medications  Medication Sig Dispense Refill   albuterol (PROVENTIL) (2.5 MG/3ML) 0.083% nebulizer solution Take 3 mLs (2.5 mg total) by nebulization every 6 (six) hours as needed  for wheezing or shortness of breath. 150 mL 0   albuterol (VENTOLIN HFA) 108 (90 Base) MCG/ACT inhaler Inhale 2 puffs into the lungs every 6 (six) hours as needed for wheezing or shortness of breath. 6.7 g 5   amoxicillin (AMOXIL) 500 MG capsule Take 4 capsules (2,000 mg total) by mouth as directed. Take one hour before dental appointment. 4 capsule 5   apixaban (ELIQUIS) 5 MG TABS tablet Take 1 tablet (5 mg total) by mouth 2 (two) times daily. 180 tablet 3   buPROPion (WELLBUTRIN SR) 150 MG 12 hr tablet Take 1 tablet (150 mg total) by mouth 2 (two) times daily. 180 tablet 3   cholecalciferol (VITAMIN D3) 25 MCG (1000 UNIT) tablet Take 1,000 Units by mouth at bedtime.     colchicine 0.6 MG tablet Take 1 tablet (0.6 mg total) by mouth every other day. 45 tablet 1   dapagliflozin propanediol (FARXIGA) 10 MG TABS tablet Take 1 tablet (10 mg total) by mouth daily before breakfast. 90 tablet 3   gabapentin (NEURONTIN) 300 MG capsule Take 1 capsule (300 mg total) by mouth 2 (two) times daily. 180 capsule 0   HYDROcodone-acetaminophen (NORCO/VICODIN) 5-325 MG tablet Take 1-2 tablets by mouth  every 6 (six) hours as needed for moderate pain (pain score 4-6). 60 tablet 0   hydrocortisone 2.5 % cream Apply topically 3 (three) times daily as needed. 30 g 3   Insulin Glargine (SEMGLEE [place]) Inject 40 Units into the skin daily.     insulin lispro (HUMALOG KWIKPEN) 100 UNIT/ML KwikPen Inject 10-20 Units into the skin 3 (three) times daily. Per sliding scale     Insulin Pen Needle (TECHLITE PEN NEEDLES) 31G X 5 MM MISC 4 (four) times daily. 400 each 3   Insulin Pen Needle (TECHLITE PEN NEEDLES) 32G X 6 MM MISC Use 4 times a day 100 each 3   Insulin Pen Needle (UNIFINE PENTIPS) 31G X 5 MM MISC Use 4 times daily as directed 400 each 3   lidocaine (LIDODERM) 5 % Place 1 patch onto the skin daily.PLACE 1 PATCH ONTO THE SKIN FOR 12 (TWELVE) HOURS. REMOVE & DISCARD PATCH WITHIN 12 HOURS OR AS DIRECTED BY MD 60 patch 11   montelukast (SINGULAIR) 10 MG tablet Take 1 tablet (10 mg total) by mouth at bedtime. 30 tablet 11   Multiple Vitamin (MULTIVITAMIN WITH MINERALS) TABS tablet Take 1 tablet by mouth daily.     mycophenolate (MYFORTIC) 180 MG EC tablet Take 2 tablets (360 mg total) by mouth 2 (two) times daily. 360 tablet 3   omeprazole (PRILOSEC) 20 MG capsule Take 1 capsule (20 mg total) by mouth daily. 30 capsule 11   predniSONE (DELTASONE) 5 MG tablet Take 1 tablet (5 mg total) by mouth daily. 90 tablet 3   rosuvastatin (CRESTOR) 10 MG tablet Take 1 tablet (10 mg total) by mouth daily. 90 tablet 3   sulfamethoxazole-trimethoprim (BACTRIM) 400-80 MG tablet Take 1 tablet by mouth 3 (three) times a week. 36 tablet 3   tacrolimus (PROGRAF) 1 MG capsule Take 2 capsules (2 mg total) by mouth 2 times daily. 120 capsule 11   tamsulosin (FLOMAX) 0.4 MG CAPS capsule Take 1 capsule (0.4 mg total) by mouth daily. 30 capsule 11   Testosterone 20.25 MG/ACT (1.62%) GEL Place 2 Pump onto the skin daily. 150 g 3   tirzepatide (MOUNJARO) 2.5 MG/0.5ML Pen Inject 0.5 mLs (2.5 mg total) subcutaneously once a week 6 mL 1    zinc  sulfate 220 (50 Zn) MG capsule Take 220 mg by mouth daily.     Continuous Glucose Sensor (FREESTYLE LIBRE 3 SENSOR) MISC Use one every 2 weeks. 6 each 3   febuxostat (ULORIC) 40 MG tablet Take 3 tablets (120 mg total) by mouth daily. 90 tablet 5   fluticasone (FLONASE) 50 MCG/ACT nasal spray Place 2 sprays into both nostrils daily. 16 g 11   losartan (COZAAR) 25 MG tablet Take by mouth.     potassium chloride (MICRO-K) 10 MEQ CR capsule Take by mouth.     potassium chloride SA (KLOR-CON M) 20 MEQ tablet Take 3 tablets (60 mEq total) by mouth 2 (two) times daily.     torsemide (DEMADEX) 20 MG tablet Take 4 tablets (80 mg total) by mouth 2 (two) times daily.     traMADol (ULTRAM) 50 MG tablet Take by mouth.     No current facility-administered medications for this visit.   Allergies:   Contrast media [iodinated contrast media], Iodine, Nsaids, and Ibuprofen   Social History:  The patient  reports that he quit smoking about 28 years ago. His smoking use included cigars. He has never been exposed to tobacco smoke. He has never used smokeless tobacco. He reports that he does not currently use alcohol. He reports that he does not use drugs.   Family History:  The patient's family history includes Breast cancer in his maternal grandmother; Diabetes in his maternal grandmother; Heart disease in his father; Kidney disease in his father; Kidney failure in his father; Liver cancer in his paternal grandmother and paternal uncle; Valvular heart disease in his mother.   ROS:  Please see the history of present illness.   All other systems are personally reviewed and negative.   Recent Labs: 10/21/2022: NT-Pro BNP 4,402 01/23/2023: B Natriuretic Peptide 163.3 02/03/2023: ALT 15 02/05/2023: Magnesium 2.7 02/11/2023: BUN 70; Creatinine, Ser 1.88; Potassium 3.3; Sodium 142; TSH 2.31 02/19/2023: Hemoglobin 12.2; Platelet Count 141  Personally reviewed   Wt Readings from Last 3 Encounters:  02/19/23  254 lb 14.4 oz (115.6 kg)  02/19/23 256 lb (116.1 kg)  02/12/23 255 lb 12.8 oz (116 kg)    BP (!) 153/73 (BP Location: Right Arm)   Pulse (!) 50   Wt 256 lb (116.1 kg)   SpO2 100%   BMI 36.73 kg/m  General: NAD Neck: JVP 8-9 cm, no thyromegaly or thyroid nodule.  Lungs: Clear to auscultation bilaterally with normal respiratory effort. CV: Nondisplaced PMI.  Heart irregular S1/S2, no S3/S4, no murmur.  No peripheral edema.  No carotid bruit.  Normal pedal pulses.  Abdomen: Soft, nontender, no hepatosplenomegaly, no distention.  Skin: Intact without lesions or rashes.  Neurologic: Alert and oriented x 3.  Psych: Normal affect. Extremities: No clubbing or cyanosis.  HEENT: Normal.   Assessment & Plan: 1. Chronic heart failure with Mid Range EF/ With prominent RV failure/pulmonary hypertension:  Ischemic cardiomyopathy.  ?If RV failure is related to severe OSA, he was a remote smoker. Echo in 2/21 with EF 45-50%, moderately dilated RV with moderately decreased systolic function, moderate pulmonary hypertension. Echo in 1/24 showed EF 40-45% , RV mod reduced, RVSP 49 mmHg, no AS, mild MR. Management complicated by CKD stage 4.  NYHA class II.  He is mildly volume overloaded on exam.     - Increase torsemide to 80 mg bid and KCl to 60 bid.  BMET/BNP in 10 days.  Will hold off on metolazone for now.   -  He will stay off losartan and spironolactone with CKD stage 4.  - Continue Farxiga 10 mg daily. Will need to watch closely for further UTIs.  Had recent Serratia pyelonephritis.  - Tadalafil was stopped due to group 2 and 3 PH (not group 1).  - I will arrange for repeat echo.  2. Atrial fibrillation: Chronic. Rate controlled.  - Continue Eliquis.  - Not on beta blocker with history of bradycardia.   3. CAD: S/p CABG 2005.  Cardiolite 12/22 with prior MI, no ischemia.  No chest pain. High threshold for cath given CKD.  - No ASA given Eliquis use.  - Continue Crestor 10 mg daily.  4. CKD  Stage 4: S/p renal transplantation in 2012. Followed by [facility] center at [facility]. He remains on mycophenolate and tacrolimus.  Recent AKI in the setting of Mounjaro use with diarrhea.  5. OSA: Continue Bipap every night.  6. HTN: BP not elevated.  Currently off hydralazine/Imdur, spironolactone, and losartan.    7. DM 2: On insulin. He follows with Endocrinology. - Continue dapagliflozin for now but watch for further GU symptoms. - He had diarrhea and AKI with Mounjaro.  He says that his nephrologist wants to try this again.  If he does try it again, needs to use lower dose carefully and stop if diarrhea recurs.  8. Pulmonary hypertension: Primarily pulmonary venous hypertension by RHC in 1/24 though there may be group 3 PH component from OSA.  - Now off tadalafil.  9. Chronic Anemia: has been getting scheduled weekly IV Fe infusions, q Fridays 10. IgG monoclonal protein: Needs followup with his hematologist.  11. Bradycardia: Patient has episodes of lightheadedness, no syncope. HR in 40s-50s at times.  Suspect tachy-brady syndrome.  - He is wearing a 14 day Zio monitor to assess for significant bradyarrhythmias.  - I will not use nodal blockers.    Followup 6 wks.   Marca Ancona 02/22/2023

## 2023-02-24 DIAGNOSIS — Z860101 Personal history of adenomatous and serrated colon polyps: Secondary | ICD-10-CM | POA: Diagnosis not present

## 2023-02-24 DIAGNOSIS — Z7901 Long term (current) use of anticoagulants: Secondary | ICD-10-CM | POA: Diagnosis not present

## 2023-02-24 DIAGNOSIS — A630 Anogenital (venereal) warts: Secondary | ICD-10-CM | POA: Diagnosis not present

## 2023-02-24 DIAGNOSIS — I4811 Longstanding persistent atrial fibrillation: Secondary | ICD-10-CM | POA: Diagnosis not present

## 2023-02-24 DIAGNOSIS — D849 Immunodeficiency, unspecified: Secondary | ICD-10-CM | POA: Diagnosis not present

## 2023-02-25 ENCOUNTER — Encounter: Payer: Self-pay | Admitting: Oncology

## 2023-02-25 NOTE — Progress Notes (Signed)
Hematology/Oncology Consult note [facility]  [phone] [phone]  Patient Care Team: [name], MD as PCP - General (Pediatrics) Laurey Morale, MD as PCP - Advanced Heart Failure (Cardiology) Iran Ouch, MD as PCP - Cardiology (Cardiology) Delma Freeze, FNP as Nurse Practitioner (Family Medicine) Laurey Morale, MD as Consulting Physician (Cardiology) Sung Amabile, DO as Consulting Physician (General Surgery) Karie Soda, MD as Consulting Physician (Colon and Rectal Surgery) Judithann Sheen Derry Skill, PA-C as Physician Assistant (Transplant) Creig Hines, MD as Consulting Physician (Oncology)   Name of the patient: [name]  [id number]  1961/03/01   Date of visit: 02/25/23  Diagnosis-anemia of chronic kidney disease  Chief complaint/ Reason for visit-routine follow-up of anemia  Heme/Onc history: patient is a 62 year old male with a past medical history is significant for stage IV CKD, ischemic cardiomyopathy, atrial fibrillation, hypertension hyperlipidemia, CAD among other medical problems.  He has undergone kidney transplant back in June 2012.  He is presently on maintenance tacrolimus and mycophenolate.  He has been referred to [name] for anemia.  CBC from 07/08/2022 showed white cell count of 5, H&H of 10.6/37 with an MCV of 82 and a platelet count of 150.  Looking back at his CBC his hemoglobin has been around 10 for the last 3 months.  Up until June 2023 his hemoglobin was close to 12.   Patient has been following up with Dr. Elizabeth Sauer from [facility] nephrology.  He has been receiving EPO as well as iron injections through them.  He last received IV iron about 3 weeks ago.  He has been receiving EPO injections about once a month and would like to transition over here for EPO and iron    Interval history-patient is currently doing well and is in mild chronic fatigue denies other complaints at this  time  ECOG PS- 1 Pain scale- 0   Review of systems- Review of Systems  Constitutional:  Positive for malaise/fatigue. Negative for chills, fever and weight loss.  HENT:  Negative for congestion, ear discharge and nosebleeds.   Eyes:  Negative for blurred vision.  Respiratory:  Negative for cough, hemoptysis, sputum production, shortness of breath and wheezing.   Cardiovascular:  Negative for chest pain, palpitations, orthopnea and claudication.  Gastrointestinal:  Negative for abdominal pain, blood in stool, constipation, diarrhea, heartburn, melena, nausea and vomiting.  Genitourinary:  Negative for dysuria, flank pain, frequency, hematuria and urgency.  Musculoskeletal:  Negative for back pain, joint pain and myalgias.  Skin:  Negative for rash.  Neurological:  Negative for dizziness, tingling, focal weakness, seizures, weakness and headaches.  Endo/Heme/Allergies:  Does not bruise/bleed easily.  Psychiatric/Behavioral:  Negative for depression and suicidal ideas. The patient does not have insomnia.       Allergies  Allergen Reactions   Contrast Media [Iodinated Contrast Media] Other (See Comments)    Kidney transplant   Iodine Other (See Comments)    Other reaction(s): Other (See Comments) Kidney transplant   Nsaids Other (See Comments)    Kidney transplant   Ibuprofen Other (See Comments)    Due to kidney transplant Due to kidney transplant Due to kidney transplant     Past Medical History:  Diagnosis Date   Anal condyloma    Anemia    Aortic atherosclerosis (HCC)    Aortic stenosis    a.) TTE 10/2021: Mod AS. AVA 1.06cm^2 (VTI). Mean grad ; b.) TTE 04/02/2022: no AV stenosis  Asthma, persistent controlled 02/25/2013   Attention or concentration deficit 04/26/2021   BPH with obstruction/lower urinary tract symptoms 09/25/2014   CAD (coronary artery disease) 2005   a.) LHC 1996 -> HG stenosis mLAD -> PTCA/PCI with stent x 1 (unk type) -> complicated by CFA  pseudoanurysm (required surg);  b.) LHC 08/10/2002  -> 95% LAD, 70% ISR LAD, 80% pOM, 70% mRCA --> CVTS consult; c.) 4v CABG 08/13/2002; c.) 03/2018 MV: Small, fixed inferoapical and apical sep defect. No isc. EF 47% (60-65% by 05/2018 TTE); d.) 02/2021 MV: small Apical inf fixed defect. No isc -> low risk.   Cardiomegaly    Cellulitis and abscess of left leg 07/22/2020   Chronic atrial fibrillation    a.) CHA2DS2VASc = 4 (CHF, HTN, vascular disease history, T2DM);  b.) rate/rhythm maintained without pharmacological intervention; chronically anticoagulated with apixaban   Chronic combined systolic and diastolic CHF (congestive heart failure)    a.) TTE 7/18 : EF 45-50%; b.) TTE 05/2018 : EF 60-65%, RVSP 74.8; c.) TTE 12/2018: EF 50-55%. Sev dil LA; d.) TTE 04/2019: EF 45-50%; e.) TTE 07/2021: EF 40-45%, G3DD; f.) TTE 10/2021: EF 40-45%, mild conc LVH, septal-lat dyssynchrony (LBBB), mildly red RVSF, mild-mod dil LA, mildly dil RA, mild-mod MS, mod AS; g.) TTE 04/02/2022: EF 40-45%, glob HK, LVH, red RVSF, RVE, sev LAE, mild-mod RAE, mild MR   Chronic venous stasis dermatitis of both lower extremities    Cytomegaloviral disease 2017   Diverticulosis of colon 04/22/2013   Dyspnea    Elevated RIGHT hemidiaphragm    Erectile dysfunction    a.) on topical TRT + Trimix (alprostadil/papaverine/phentolamine) injections   ESRD (end stage renal disease) (HCC)    a.) dialysis dependent 2004-2012; b.) s/p cadaveric RIGHT renal transplant 09/13/2010   Essential hypertension 12/17/2010   GERD (gastroesophageal reflux disease)    Gout    History of 2019 novel coronavirus disease (COVID-19) 06/30/2019   History of bilateral cataract extraction 10/2017   History of renal transplant 09/13/2010   a.) s/p cadaveric donor transplant 09/13/2010   Hx of bilateral cataract extraction 10/2017   Hyperlipidemia    Hypogonadism in male 09/25/2014   Ischemic cardiomyopathy    Left cephalic vein thrombosis 06/2019   Long  term current use of anticoagulant    a.) apixaban   Long term current use of immunosuppressive drug    a.) mycophenolate + prednisone + tacrolimus   Major depressive disorder 10/04/2013   Mitral stenosis 11/07/2021   a.) TTE 11/07/2021: severe MAC, mild-mod MS (MPG 6 mmHg); b.) TTE 04/02/2022: no MV stenosis   Murmur    Obesity hypoventilation syndrome 03/31/2012   OSA treated with BiPAP 09/29/2012   PAH (pulmonary artery hypertension)    a. 04/2019 RHC: RA 19, RV 80/20, PA 78/31 (51), PCWP 25, CO/CI 7.69/3.26. PVR 3.25 --> Sev PAH, likely primarily PV HTN; b.) RHC 04/04/2022: mRA 13, mPA 40, mPCWP 20, PA sat 59, AO sat 92, CO 7.63, CI 3.26, PVR 2.6 --> mod portal venous HTN   Pancreatitis    S/P CABG x 4 08/13/2002   a.) LIMA-LAD, LRA-OM, SVG-D1, SVG-dRCA   S/P gastric bypass    Synovitis of finger 06/11/2019   Trigger finger, unspecified little finger 12/23/2018   Type 2 diabetes mellitus with hyperglycemia, with long-term current use of insulin 09/09/2014   a.) has FreeStyle Libre CGM   Ulcer    Left shin goes to wound care center at [facility]   Wears glasses  Wears hearing aid in both ears      Past Surgical History:  Procedure Laterality Date   CARDIAC CATHETERIZATION N/A 08/10/2002   CATARACT EXTRACTION W/PHACO Right 10/29/2017   Procedure: CATARACT EXTRACTION PHACO AND INTRAOCULAR LENS PLACEMENT (IOC)  RIGHT DIABETIC;  Surgeon: Lockie Mola, MD;  Location: [facility];  Service: Ophthalmology;  Laterality: Right   CATARACT EXTRACTION W/PHACO Left 11/18/2017   Procedure: CATARACT EXTRACTION PHACO AND INTRAOCULAR LENS PLACEMENT (IOC) LEFT IVA/TOPICAL;  Surgeon: Lockie Mola, MD;  Location: [facility];  Service: Ophthalmology;  Laterality: Left   COLONOSCOPY WITH PROPOFOL N/A 04/22/2013   Procedure: COLONOSCOPY WITH PROPOFOL;  Surgeon: Rachael Fee, MD;  Location: [facility];  Service: Endoscopy;  Laterality: N/A;   CORONARY  ANGIOPLASTY WITH STENT PLACEMENT N/A 1996   CORONARY ARTERY BYPASS GRAFT N/A 08/13/2002   Procedure: 4v CORONARY ARTERY BYPASS GRAFTING; Location: [facility]; Surgeon: Katy Apo, MD   CYSTOSCOPY WITH INSERTION OF UROLIFT N/A 12/25/2021   Procedure: CYSTOSCOPY WITH INSERTION OF UROLIFT;  Surgeon: Riki Altes, MD;  Location: [facility];  Service: Urology;  Laterality: N/A;   DG ANGIO AV SHUNT*L*     right and left upper arms   FASCIOTOMY  03/03/2012   Procedure: FASCIOTOMY;  Surgeon: Nicki Reaper, MD;  Location: [facility];  Service: Orthopedics;  Laterality: Right;  FASCIOTOMY RIGHT SMALL FINGER   FASCIOTOMY Left 08/17/2013   Procedure: FASCIOTOMY LEFT RING;  Surgeon: Nicki Reaper, MD;  Location: [facility];  Service: Orthopedics;  Laterality: Left;   INCISION AND DRAINAGE ABSCESS Left 10/15/2015   Procedure: INCISION AND DRAINAGE ABSCESS;  Surgeon: Leafy Ro, MD;  Location: [facility];  Service: General;  Laterality: Left;   KIDNEY TRANSPLANT  09/13/2010   cadaver--at [facility]   LASER ABLATION CONDOLAMATA N/A 01/08/2023   Procedure: LASER REMOVAL ABLATION OF CONDYLOMATA;  Surgeon: Karie Soda, MD;  Location: [facility];  Service: General;  Laterality: N/A;  GEN AND LOCAL   RECTAL EXAM UNDER ANESTHESIA N/A 01/08/2023   Procedure: ANORECTAL EXAM UNDER ANESTHESIA;  Surgeon: Karie Soda, MD;  Location: [facility];  Service: General;  Laterality: N/A;   RIGHT HEART CATH N/A 11/15/2016   Procedure: RIGHT HEART CATH;  Surgeon: Iran Ouch, MD;  Location: [facility];  Service: Cardiovascular;  Laterality: N/A;   RIGHT HEART CATH N/A 05/03/2019   Procedure: RIGHT HEART CATH;  Surgeon: Laurey Morale, MD;  Location: [facility];  Service: Cardiovascular;  Laterality: N/A;   RIGHT HEART CATH N/A 04/04/2022   Procedure: RIGHT HEART CATH;  Surgeon: Laurey Morale, MD;  Location: [facility]AS[facility] CV [facility];  Service: Cardiovascular;  Laterality: N/A;    ROUX-EN-Y GASTRIC BYPASS N/A 2015   TYMPANIC MEMBRANE REPAIR Left 03/2010   VASECTOMY     WART FULGURATION N/A 05/31/2022   Procedure: FULGURATION ANAL WART;  Surgeon: Sung Amabile, DO;  Location: [facility];  Service: General;  Laterality: N/A;    Social History   Socioeconomic History   Marital status: Married    Spouse name: Not on file   Number of children: 2   Years of education: 14   Highest education level: Associate degree: academic program  Occupational History   Occupation: Presenter, broadcasting: unemployed    Comment: disabled due to kidney failure  Tobacco Use   Smoking status: Former    Types: Cigars    Quit date: 09/02/1994  Years since quitting: 28.5    Passive exposure: Never   Smokeless tobacco: Never  Vaping Use   Vaping status: Never Used  Substance and Sexual Activity   Alcohol use: Not Currently    Comment: occ   Drug use: No   Sexual activity: Not on file  Other Topics Concern   Not on file  Social History Narrative   Has living will   Wife is health care POA---son Molli Hazard is alternate   Would accept resuscitation attempts   No tube feedings if cognitively unaware   Social Determinants of Health   Financial Resource Strain: Not on file  Food Insecurity: Low Risk  (02/14/2023)   Received from [facility]   Hunger Vital Sign    Worried About Running Out of Food in the Last Year: Never true    Ran Out of Food in the Last Year: Never true  Transportation Needs: No Transportation Needs (02/14/2023)   Received from [facility]    In the past 12 months, has lack of reliable transportation kept you from medical appointments, meetings, work or from getting things needed for daily living? : No  Physical Activity: Not on file  Stress: Not on file  Social Connections: Not on file  Intimate Partner Violence: Not At Risk (02/04/2023)   Humiliation, Afraid, Rape, and Kick questionnaire    Fear of Current or  Ex-Partner: No    Emotionally Abused: No    Physically Abused: No    Sexually Abused: No    Family History  Problem Relation Age of Onset   Heart disease Father    Kidney failure Father    Kidney disease Father    Diabetes Maternal Grandmother    Breast cancer Maternal Grandmother    Valvular heart disease Mother    Liver cancer Paternal Uncle    Liver cancer Paternal Grandmother    Prostate cancer Neg Hx      Current Outpatient Medications:    losartan (COZAAR) 25 MG tablet, Take by mouth., Disp: , Rfl:    potassium chloride (MICRO-K) 10 MEQ CR capsule, Take by mouth., Disp: , Rfl:    traMADol (ULTRAM) 50 MG tablet, Take by mouth., Disp: , Rfl:    albuterol (PROVENTIL) (2.5 MG/3ML) 0.083% nebulizer solution, Take 3 mLs (2.5 mg total) by nebulization every 6 (six) hours as needed for wheezing or shortness of breath., Disp: 150 mL, Rfl: 0   albuterol (VENTOLIN HFA) 108 (90 Base) MCG/ACT inhaler, Inhale 2 puffs into the lungs every 6 (six) hours as needed for wheezing or shortness of breath., Disp: 6.7 g, Rfl: 5   amoxicillin (AMOXIL) 500 MG capsule, Take 4 capsules (2,000 mg total) by mouth as directed. Take one hour before dental appointment., Disp: 4 capsule, Rfl: 5   apixaban (ELIQUIS) 5 MG TABS tablet, Take 1 tablet (5 mg total) by mouth 2 (two) times daily., Disp: 180 tablet, Rfl: 3   buPROPion (WELLBUTRIN SR) 150 MG 12 hr tablet, Take 1 tablet (150 mg total) by mouth 2 (two) times daily., Disp: 180 tablet, Rfl: 3   cholecalciferol (VITAMIN D3) 25 MCG (1000 UNIT) tablet, Take 1,000 Units by mouth at bedtime., Disp: , Rfl:    colchicine 0.6 MG tablet, Take 1 tablet (0.6 mg total) by mouth every other day., Disp: 45 tablet, Rfl: 1   Continuous Glucose Sensor (FREESTYLE LIBRE 3 SENSOR) MISC, Use one every 2 weeks., Disp: 6 each, Rfl: 3   dapagliflozin propanediol (FARXIGA) 10 MG  TABS tablet, Take 1 tablet (10 mg total) by mouth daily before breakfast., Disp: 90 tablet, Rfl: 3    febuxostat (ULORIC) 40 MG tablet, Take 3 tablets (120 mg total) by mouth daily., Disp: 90 tablet, Rfl: 5   fluticasone (FLONASE) 50 MCG/ACT nasal spray, Place 2 sprays into both nostrils daily., Disp: 16 g, Rfl: 11   gabapentin (NEURONTIN) 300 MG capsule, Take 1 capsule (300 mg total) by mouth 2 (two) times daily., Disp: 180 capsule, Rfl: 0   HYDROcodone-acetaminophen (NORCO/VICODIN) 5-325 MG tablet, Take 1-2 tablets by mouth every 6 (six) hours as needed for moderate pain (pain score 4-6)., Disp: 60 tablet, Rfl: 0   hydrocortisone 2.5 % cream, Apply topically 3 (three) times daily as needed., Disp: 30 g, Rfl: 3   Insulin Glargine (SEMGLEE [place]), Inject 40 Units into the skin daily., Disp: , Rfl:    insulin lispro (HUMALOG KWIKPEN) 100 UNIT/ML KwikPen, Inject 10-20 Units into the skin 3 (three) times daily. Per sliding scale, Disp: , Rfl:    Insulin Pen Needle (TECHLITE PEN NEEDLES) 31G X 5 MM MISC, 4 (four) times daily., Disp: 400 each, Rfl: 3   Insulin Pen Needle (TECHLITE PEN NEEDLES) 32G X 6 MM MISC, Use 4 times a day, Disp: 100 each, Rfl: 3   Insulin Pen Needle (UNIFINE PENTIPS) 31G X 5 MM MISC, Use 4 times daily as directed, Disp: 400 each, Rfl: 3   lidocaine (LIDODERM) 5 %, Place 1 patch onto the skin daily.PLACE 1 PATCH ONTO THE SKIN FOR 12 (TWELVE) HOURS. REMOVE & DISCARD PATCH WITHIN 12 HOURS OR AS DIRECTED BY MD, Disp: 60 patch, Rfl: 11   montelukast (SINGULAIR) 10 MG tablet, Take 1 tablet (10 mg total) by mouth at bedtime., Disp: 30 tablet, Rfl: 11   Multiple Vitamin (MULTIVITAMIN WITH MINERALS) TABS tablet, Take 1 tablet by mouth daily., Disp: , Rfl:    mycophenolate (MYFORTIC) 180 MG EC tablet, Take 2 tablets (360 mg total) by mouth 2 (two) times daily., Disp: 360 tablet, Rfl: 3   omeprazole (PRILOSEC) 20 MG capsule, Take 1 capsule (20 mg total) by mouth daily., Disp: 30 capsule, Rfl: 11   potassium chloride SA (KLOR-CON M) 20 MEQ tablet, Take 3 tablets (60 mEq total) by mouth 2 (two)  times daily., Disp: , Rfl:    predniSONE (DELTASONE) 5 MG tablet, Take 1 tablet (5 mg total) by mouth daily., Disp: 90 tablet, Rfl: 3   rosuvastatin (CRESTOR) 10 MG tablet, Take 1 tablet (10 mg total) by mouth daily., Disp: 90 tablet, Rfl: 3   sulfamethoxazole-trimethoprim (BACTRIM) 400-80 MG tablet, Take 1 tablet by mouth 3 (three) times a week., Disp: 36 tablet, Rfl: 3   tacrolimus (PROGRAF) 1 MG capsule, Take 2 capsules (2 mg total) by mouth 2 times daily., Disp: 120 capsule, Rfl: 11   tamsulosin (FLOMAX) 0.4 MG CAPS capsule, Take 1 capsule (0.4 mg total) by mouth daily., Disp: 30 capsule, Rfl: 11   Testosterone 20.25 MG/ACT (1.62%) GEL, Place 2 Pump onto the skin daily., Disp: 150 g, Rfl: 3   tirzepatide (MOUNJARO) 2.5 MG/0.5ML Pen, Inject 0.5 mLs (2.5 mg total) subcutaneously once a week, Disp: 6 mL, Rfl: 1   torsemide (DEMADEX) 20 MG tablet, Take 4 tablets (80 mg total) by mouth 2 (two) times daily., Disp: , Rfl:    zinc sulfate 220 (50 Zn) MG capsule, Take 220 mg by mouth daily., Disp: , Rfl:   Physical exam:  Vitals:   02/19/23 1412  BP: 139/68  Pulse: (!) 50  Resp: 18  Temp: (!) 96.2 F (35.7 C)  TempSrc: Tympanic  SpO2: 100%  Weight: 254 lb 14.4 oz (115.6 kg)   Physical Exam Cardiovascular:     Rate and Rhythm: Normal rate and regular rhythm.     Heart sounds: Normal heart sounds.  Pulmonary:     Effort: Pulmonary effort is normal.     Breath sounds: Normal breath sounds.  Skin:    General: Skin is warm and dry.  Neurological:     Mental Status: He is alert and oriented to person, place, and time.         Latest Ref Rng & Units 02/11/2023   11:41 AM  CMP  Glucose 70 - 99 mg/dL 161   BUN 6 - 23 mg/dL 70   Creatinine 0.96 - 1.50 mg/dL 0.45   Sodium 409 - 811 mEq/L 142   Potassium 3.5 - 5.1 mEq/L 3.3   Chloride 96 - 112 mEq/L 104   CO2 19 - 32 mEq/L 27   Calcium 8.4 - 10.5 mg/dL 9.2       Latest Ref Rng & Units 02/19/2023    1:54 PM  CBC  WBC 4.0 - 10.5  K/uL 4.5   Hemoglobin 13.0 - 17.0 g/dL 91.4   Hematocrit 78.2 - 52.0 % 38.4   Platelets 150 - 400 K/uL 141     No images are attached to the encounter.  MR BRAIN WO CONTRAST  Result Date: 02/04/2023 CLINICAL DATA:  Initial evaluation for neuro deficit, stroke suspected. EXAM: MRI HEAD WITHOUT CONTRAST MRA HEAD WITHOUT CONTRAST MRA NECK WITHOUT CONTRAST TECHNIQUE: Multiplanar, multiecho pulse sequences of the brain and surrounding structures were obtained without intravenous contrast. Angiographic images of the Circle of Willis were obtained using MRA technique without intravenous contrast. Angiographic images of the neck were obtained using MRA technique without intravenous contrast. Carotid stenosis measurements (when applicable) are obtained utilizing NASCET criteria, using the distal internal carotid diameter as the denominator. COMPARISON:  Prior CT from earlier the same day. FINDINGS: MRI HEAD FINDINGS Brain: Examination degraded by motion artifact. Mild age-related cerebral atrophy. Patchy T2/FLAIR hyperintensity involving the periventricular deep white matter both cerebral hemispheres, consistent with chronic small vessel ischemic disease, mild to moderate in nature. No evidence for acute or subacute ischemia. Gray-white matter differentiation maintained. No acute intracranial hemorrhage. Single punctate chronic microhemorrhage noted within the posterior right cerebral hemisphere, of doubtful significance in isolation. No mass lesion, midline shift or mass effect. No hydrocephalus or extra-axial fluid collection. Pituitary gland within normal limits. Vascular: Major intracranial vascular flow voids are maintained. Skull and upper cervical spine: Craniocervical junction within normal limits. Bone marrow signal intensity normal. No scalp soft tissue abnormality. Sinuses/Orbits: Prior bilateral ocular lens replacement. Paranasal sinuses are largely clear. Trace left mastoid effusion, of doubtful  significance. Other: None. MRA HEAD FINDINGS ANTERIOR CIRCULATION: Both internal carotid arteries are patent to the termini without stenosis or other abnormality. A1 segments, anterior communicating artery complex common anterior cerebral arteries patent without stenosis. No M1 stenosis or occlusion. Distal MCA branches perfused and symmetric. POSTERIOR CIRCULATION: Distal V4 segments widely patent. Neither PICA origin visualized. Basilar patent without stenosis. Superior cerebellar and posterior cerebral arteries patent bilaterally. MRA NECK FINDINGS AORTIC ARCH: Visualized aortic arch within normal limits for caliber with standard branch pattern. No stenosis about the origin of the great vessels. RIGHT CAROTID SYSTEM: Right common and internal carotid arteries are patent with antegrade flow. No evidence for dissection or hemodynamically  significant stenosis. LEFT CAROTID SYSTEM: Left common and internal carotid arteries are patent with antegrade flow. No evidence for dissection or hemodynamically significant stenosis. VERTEBRAL ARTERIES: Vertebral artery origins not well visualized on this exam. Vertebral arteries are largely codominant with antegrade flow. No dissection or stenosis. IMPRESSION: MRI HEAD: 1. No acute intracranial abnormality. 2. Mild age-related cerebral atrophy with mild to moderate chronic microvascular ischemic disease. MRA HEAD: Normal intracranial MRA. MRA NECK: Normal MRA of the neck. Electronically Signed   By: [name] M.D.   On: 02/04/2023 00:09   MR ANGIO HEAD WO CONTRAST  Result Date: 02/04/2023 CLINICAL DATA:  Initial evaluation for neuro deficit, stroke suspected. EXAM: MRI HEAD WITHOUT CONTRAST MRA HEAD WITHOUT CONTRAST MRA NECK WITHOUT CONTRAST TECHNIQUE: Multiplanar, multiecho pulse sequences of the brain and surrounding structures were obtained without intravenous contrast. Angiographic images of the Circle of Willis were obtained using MRA technique without  intravenous contrast. Angiographic images of the neck were obtained using MRA technique without intravenous contrast. Carotid stenosis measurements (when applicable) are obtained utilizing NASCET criteria, using the distal internal carotid diameter as the denominator. COMPARISON:  Prior CT from earlier the same day. FINDINGS: MRI HEAD FINDINGS Brain: Examination degraded by motion artifact. Mild age-related cerebral atrophy. Patchy T2/FLAIR hyperintensity involving the periventricular deep white matter both cerebral hemispheres, consistent with chronic small vessel ischemic disease, mild to moderate in nature. No evidence for acute or subacute ischemia. Gray-white matter differentiation maintained. No acute intracranial hemorrhage. Single punctate chronic microhemorrhage noted within the posterior right cerebral hemisphere, of doubtful significance in isolation. No mass lesion, midline shift or mass effect. No hydrocephalus or extra-axial fluid collection. Pituitary gland within normal limits. Vascular: Major intracranial vascular flow voids are maintained. Skull and upper cervical spine: Craniocervical junction within normal limits. Bone marrow signal intensity normal. No scalp soft tissue abnormality. Sinuses/Orbits: Prior bilateral ocular lens replacement. Paranasal sinuses are largely clear. Trace left mastoid effusion, of doubtful significance. Other: None. MRA HEAD FINDINGS ANTERIOR CIRCULATION: Both internal carotid arteries are patent to the termini without stenosis or other abnormality. A1 segments, anterior communicating artery complex common anterior cerebral arteries patent without stenosis. No M1 stenosis or occlusion. Distal MCA branches perfused and symmetric. POSTERIOR CIRCULATION: Distal V4 segments widely patent. Neither PICA origin visualized. Basilar patent without stenosis. Superior cerebellar and posterior cerebral arteries patent bilaterally. MRA NECK FINDINGS AORTIC ARCH: Visualized aortic  arch within normal limits for caliber with standard branch pattern. No stenosis about the origin of the great vessels. RIGHT CAROTID SYSTEM: Right common and internal carotid arteries are patent with antegrade flow. No evidence for dissection or hemodynamically significant stenosis. LEFT CAROTID SYSTEM: Left common and internal carotid arteries are patent with antegrade flow. No evidence for dissection or hemodynamically significant stenosis. VERTEBRAL ARTERIES: Vertebral artery origins not well visualized on this exam. Vertebral arteries are largely codominant with antegrade flow. No dissection or stenosis. IMPRESSION: MRI HEAD: 1. No acute intracranial abnormality. 2. Mild age-related cerebral atrophy with mild to moderate chronic microvascular ischemic disease. MRA HEAD: Normal intracranial MRA. MRA NECK: Normal MRA of the neck. Electronically Signed   By: [name] M.D.   On: 02/04/2023 00:09   MR ANGIO NECK WO CONTRAST  Result Date: 02/04/2023 CLINICAL DATA:  Initial evaluation for neuro deficit, stroke suspected. EXAM: MRI HEAD WITHOUT CONTRAST MRA HEAD WITHOUT CONTRAST MRA NECK WITHOUT CONTRAST TECHNIQUE: Multiplanar, multiecho pulse sequences of the brain and surrounding structures were obtained without intravenous contrast. Angiographic images of the [place] of  [place] were obtained using MRA technique without intravenous contrast. Angiographic images of the neck were obtained using MRA technique without intravenous contrast. Carotid stenosis measurements (when applicable) are obtained utilizing NASCET criteria, using the distal internal carotid diameter as the denominator. COMPARISON:  Prior CT from earlier the same day. FINDINGS: MRI HEAD FINDINGS Brain: Examination degraded by motion artifact. Mild age-related cerebral atrophy. Patchy T2/FLAIR hyperintensity involving the periventricular deep white matter both cerebral hemispheres, consistent with chronic small vessel ischemic disease, mild  to moderate in nature. No evidence for acute or subacute ischemia. Gray-white matter differentiation maintained. No acute intracranial hemorrhage. Single punctate chronic microhemorrhage noted within the posterior right cerebral hemisphere, of doubtful significance in isolation. No mass lesion, midline shift or mass effect. No hydrocephalus or extra-axial fluid collection. Pituitary gland within normal limits. Vascular: Major intracranial vascular flow voids are maintained. Skull and upper cervical spine: Craniocervical junction within normal limits. Bone marrow signal intensity normal. No scalp soft tissue abnormality. Sinuses/Orbits: Prior bilateral ocular lens replacement. Paranasal sinuses are largely clear. Trace left mastoid effusion, of doubtful significance. Other: None. MRA HEAD FINDINGS ANTERIOR CIRCULATION: Both internal carotid arteries are patent to the termini without stenosis or other abnormality. A1 segments, anterior communicating artery complex common anterior cerebral arteries patent without stenosis. No M1 stenosis or occlusion. Distal MCA branches perfused and symmetric. POSTERIOR CIRCULATION: Distal V4 segments widely patent. Neither PICA origin visualized. Basilar patent without stenosis. Superior cerebellar and posterior cerebral arteries patent bilaterally. MRA NECK FINDINGS AORTIC ARCH: Visualized aortic arch within normal limits for caliber with standard branch pattern. No stenosis about the origin of the great vessels. RIGHT CAROTID SYSTEM: Right common and internal carotid arteries are patent with antegrade flow. No evidence for dissection or hemodynamically significant stenosis. LEFT CAROTID SYSTEM: Left common and internal carotid arteries are patent with antegrade flow. No evidence for dissection or hemodynamically significant stenosis. VERTEBRAL ARTERIES: Vertebral artery origins not well visualized on this exam. Vertebral arteries are largely codominant with antegrade flow. No  dissection or stenosis. IMPRESSION: MRI HEAD: 1. No acute intracranial abnormality. 2. Mild age-related cerebral atrophy with mild to moderate chronic microvascular ischemic disease. MRA HEAD: Normal intracranial MRA. MRA NECK: Normal MRA of the neck. Electronically Signed   By: [name] M.D.   On: 02/04/2023 00:09   CT HEAD WO CONTRAST  Result Date: 02/03/2023 CLINICAL DATA:  Slurred speech x5 days. EXAM: CT HEAD WITHOUT CONTRAST TECHNIQUE: Contiguous axial images were obtained from the base of the skull through the vertex without intravenous contrast. RADIATION DOSE REDUCTION: This exam was performed according to the departmental dose-optimization program which includes automated exposure control, adjustment of the mA and/or kV according to patient size and/or use of iterative reconstruction technique. COMPARISON:  July 06, 2022 FINDINGS: Brain: There is mild cerebral atrophy with widening of the extra-axial spaces and ventricular dilatation. There are areas of decreased attenuation within the white matter tracts of the supratentorial brain, consistent with microvascular disease changes. Vascular: Marked severity bilateral cavernous carotid artery calcification is noted. Skull: Normal. Negative for fracture or focal lesion. Sinuses/Orbits: There is mild left ethmoid sinus mucosal thickening. Other: None. IMPRESSION: 1. Generalized cerebral atrophy with chronic white matter small vessel ischemic changes. 2. No acute intracranial abnormality. 3. Mild left ethmoid sinus disease. Electronically Signed   By: Aram Candela M.D.   On: 02/03/2023 20:07     Assessment and plan- Patient is a 62 y.o. male here for routine follow-up of anemia of chronic kidney disease  Patient's hemoglobin has been stable between 12-13 since September 2024.  He is therefore not required any EPO after transitioning care to [place].  Ferritin levels are normal at 107 with an iron saturation of 19%.  I will plan to  give him 3 doses of IV iron Venofer to keep his iron saturation greater than 20%..  CBC ferritin and iron studies in 3 and 6 months and I will see him back in 6 months.  I will also check myeloma panel and free light chains in 6 months   Visit Diagnosis 1. Anemia of chronic kidney failure, stage 4 (severe) (HCC)      Dr. Owens Shark, MD, MPH [facility] at [facility] [phone] 02/25/2023 9:44 AM

## 2023-02-26 ENCOUNTER — Other Ambulatory Visit: Payer: Self-pay

## 2023-02-26 LAB — MULTIPLE MYELOMA PANEL, SERUM
Albumin SerPl Elph-Mcnc: 3.4 g/dL (ref 2.9–4.4)
Albumin/Glob SerPl: 1 (ref 0.7–1.7)
Alpha 1: 0.3 g/dL (ref 0.0–0.4)
Alpha2 Glob SerPl Elph-Mcnc: 0.8 g/dL (ref 0.4–1.0)
B-Globulin SerPl Elph-Mcnc: 0.8 g/dL (ref 0.7–1.3)
Gamma Glob SerPl Elph-Mcnc: 1.6 g/dL (ref 0.4–1.8)
Globulin, Total: 3.5 g/dL (ref 2.2–3.9)
IgA: 167 mg/dL (ref 61–437)
IgG (Immunoglobin G), Serum: 1644 mg/dL — ABNORMAL HIGH (ref 603–1613)
IgM (Immunoglobulin M), Srm: 175 mg/dL — ABNORMAL HIGH (ref 20–172)
M Protein SerPl Elph-Mcnc: 0.6 g/dL — ABNORMAL HIGH
Total Protein ELP: 6.9 g/dL (ref 6.0–8.5)

## 2023-02-26 MED FILL — Tamsulosin HCl Cap 0.4 MG: ORAL | 30 days supply | Qty: 30 | Fill #1 | Status: AC

## 2023-03-03 ENCOUNTER — Inpatient Hospital Stay: Payer: Commercial Managed Care - PPO | Attending: Oncology

## 2023-03-03 VITALS — BP 152/79 | HR 53 | Resp 16

## 2023-03-03 DIAGNOSIS — I890 Lymphedema, not elsewhere classified: Secondary | ICD-10-CM | POA: Diagnosis not present

## 2023-03-03 DIAGNOSIS — I87333 Chronic venous hypertension (idiopathic) with ulcer and inflammation of bilateral lower extremity: Secondary | ICD-10-CM | POA: Diagnosis not present

## 2023-03-03 DIAGNOSIS — I5042 Chronic combined systolic (congestive) and diastolic (congestive) heart failure: Secondary | ICD-10-CM | POA: Diagnosis not present

## 2023-03-03 DIAGNOSIS — D631 Anemia in chronic kidney disease: Secondary | ICD-10-CM | POA: Diagnosis not present

## 2023-03-03 DIAGNOSIS — I10 Essential (primary) hypertension: Secondary | ICD-10-CM | POA: Diagnosis not present

## 2023-03-03 DIAGNOSIS — I480 Paroxysmal atrial fibrillation: Secondary | ICD-10-CM | POA: Diagnosis not present

## 2023-03-03 DIAGNOSIS — S81809A Unspecified open wound, unspecified lower leg, initial encounter: Secondary | ICD-10-CM | POA: Diagnosis not present

## 2023-03-03 DIAGNOSIS — D508 Other iron deficiency anemias: Secondary | ICD-10-CM

## 2023-03-03 DIAGNOSIS — S81812A Laceration without foreign body, left lower leg, initial encounter: Secondary | ICD-10-CM | POA: Diagnosis not present

## 2023-03-03 DIAGNOSIS — E11622 Type 2 diabetes mellitus with other skin ulcer: Secondary | ICD-10-CM | POA: Diagnosis not present

## 2023-03-03 DIAGNOSIS — R001 Bradycardia, unspecified: Secondary | ICD-10-CM | POA: Diagnosis not present

## 2023-03-03 DIAGNOSIS — L97822 Non-pressure chronic ulcer of other part of left lower leg with fat layer exposed: Secondary | ICD-10-CM | POA: Diagnosis not present

## 2023-03-03 DIAGNOSIS — L97812 Non-pressure chronic ulcer of other part of right lower leg with fat layer exposed: Secondary | ICD-10-CM | POA: Diagnosis not present

## 2023-03-03 DIAGNOSIS — N184 Chronic kidney disease, stage 4 (severe): Secondary | ICD-10-CM | POA: Insufficient documentation

## 2023-03-03 MED ORDER — IRON SUCROSE 20 MG/ML IV SOLN
200.0000 mg | INTRAVENOUS | Status: DC
  Administered 2023-03-03: 200 mg via INTRAVENOUS
  Filled 2023-03-03: qty 10

## 2023-03-05 NOTE — Addendum Note (Signed)
Encounter addended by: Crissie Figures, RN on: 03/05/2023 11:47 AM  Actions taken: Imaging Exam ended

## 2023-03-10 ENCOUNTER — Inpatient Hospital Stay: Payer: Commercial Managed Care - PPO

## 2023-03-11 ENCOUNTER — Telehealth (HOSPITAL_COMMUNITY): Payer: Self-pay

## 2023-03-11 ENCOUNTER — Other Ambulatory Visit: Payer: Self-pay

## 2023-03-11 NOTE — Telephone Encounter (Addendum)
Pt aware, agreeable, and verbalized understanding   ----- Message from Marca Ancona sent at 03/09/2023 10:49 PM EST ----- Average HR 52, no long pauses.  No indication for pacemaker.

## 2023-03-12 ENCOUNTER — Other Ambulatory Visit: Payer: Self-pay

## 2023-03-12 ENCOUNTER — Other Ambulatory Visit: Payer: Self-pay | Admitting: Oncology

## 2023-03-12 ENCOUNTER — Inpatient Hospital Stay: Payer: Commercial Managed Care - PPO

## 2023-03-12 ENCOUNTER — Other Ambulatory Visit: Payer: Self-pay | Admitting: Cardiology

## 2023-03-12 VITALS — BP 138/58 | HR 57 | Resp 16

## 2023-03-12 DIAGNOSIS — D631 Anemia in chronic kidney disease: Secondary | ICD-10-CM | POA: Diagnosis not present

## 2023-03-12 DIAGNOSIS — D508 Other iron deficiency anemias: Secondary | ICD-10-CM

## 2023-03-12 DIAGNOSIS — N184 Chronic kidney disease, stage 4 (severe): Secondary | ICD-10-CM | POA: Diagnosis not present

## 2023-03-12 MED ORDER — POTASSIUM CHLORIDE CRYS ER 20 MEQ PO TBCR: 60.0000 meq | EXTENDED_RELEASE_TABLET | Freq: Two times a day (BID) | ORAL | 1 refills | Status: DC

## 2023-03-12 MED ORDER — IRON SUCROSE 20 MG/ML IV SOLN
200.0000 mg | INTRAVENOUS | Status: DC
Start: 2023-03-12 — End: 2023-03-12
  Administered 2023-03-12: 200 mg via INTRAVENOUS

## 2023-03-12 MED ORDER — TACROLIMUS 1 MG PO CAPS
ORAL_CAPSULE | ORAL | 11 refills | Status: DC
  Filled 2023-04-19: qty 90, 30d supply, fill #0
  Filled 2023-06-04: qty 270, 90d supply, fill #1
  Filled 2023-06-19 – 2023-11-06 (×2): qty 270, 90d supply, fill #2

## 2023-03-12 MED ORDER — SODIUM CHLORIDE 0.9% FLUSH
10.0000 mL | Freq: Once | INTRAVENOUS | Status: AC | PRN
  Administered 2023-03-12: 10 mL
  Filled 2023-03-12: qty 10

## 2023-03-12 MED ORDER — PREDNISONE 5 MG PO TABS
5.0000 mg | ORAL_TABLET | Freq: Every day | ORAL | 3 refills | Status: DC
  Filled 2023-03-12: qty 90, 90d supply, fill #0
  Filled 2023-05-14: qty 90, 90d supply, fill #1
  Filled 2023-09-21: qty 90, 90d supply, fill #2
  Filled 2023-12-11: qty 90, 90d supply, fill #3

## 2023-03-12 MED ORDER — TACROLIMUS 1 MG PO CAPS
ORAL_CAPSULE | ORAL | 11 refills | Status: DC
  Filled 2023-03-12: qty 90, 30d supply, fill #0

## 2023-03-12 MED FILL — Tamsulosin HCl Cap 0.4 MG: ORAL | 30 days supply | Qty: 30 | Fill #2 | Status: CN

## 2023-03-12 MED FILL — Omeprazole Cap Delayed Release 20 MG: ORAL | 30 days supply | Qty: 30 | Fill #2 | Status: AC

## 2023-03-12 MED FILL — Montelukast Sodium Tab 10 MG (Base Equiv): ORAL | 30 days supply | Qty: 30 | Fill #2 | Status: AC

## 2023-03-13 ENCOUNTER — Other Ambulatory Visit
Admission: RE | Admit: 2023-03-13 | Discharge: 2023-03-13 | Disposition: A | Discharge status: Home / Self Care | Payer: Commercial Managed Care - PPO | Attending: Cardiology | Admitting: Cardiology

## 2023-03-13 ENCOUNTER — Other Ambulatory Visit: Payer: Self-pay

## 2023-03-13 DIAGNOSIS — I5042 Chronic combined systolic (congestive) and diastolic (congestive) heart failure: Secondary | ICD-10-CM | POA: Diagnosis present

## 2023-03-13 LAB — BASIC METABOLIC PANEL
Anion gap: 12 (ref 5–15)
BUN: 82 mg/dL — ABNORMAL HIGH (ref 8–23)
CO2: 26 mmol/L (ref 22–32)
Calcium: 9 mg/dL (ref 8.9–10.3)
Chloride: 101 mmol/L (ref 98–111)
Creatinine, Ser: 2.23 mg/dL — ABNORMAL HIGH (ref 0.61–1.24)
GFR, Estimated: 32 mL/min — ABNORMAL LOW (ref >60)
Glucose, Bld: 174 mg/dL — ABNORMAL HIGH (ref 70–99)
Potassium: 4.3 mmol/L (ref 3.5–5.1)
Sodium: 139 mmol/L (ref 135–145)

## 2023-03-13 LAB — BRAIN NATRIURETIC PEPTIDE: B Natriuretic Peptide: 160.6 pg/mL — ABNORMAL HIGH (ref 0.0–100.0)

## 2023-03-14 ENCOUNTER — Telehealth: Payer: Self-pay

## 2023-03-14 DIAGNOSIS — I5042 Chronic combined systolic (congestive) and diastolic (congestive) heart failure: Secondary | ICD-10-CM

## 2023-03-14 NOTE — Telephone Encounter (Signed)
Spoke with patient regarding the following results. Patient made aware and patient verbalized understanding.    Blood work ordered.   Advised patient to call back to office with any issues, questions, or concerns. Patient verbalized understanding.

## 2023-03-14 NOTE — Telephone Encounter (Signed)
-----   Message from [employer] sent at 03/13/2023  1:41 PM EST ----- Creatinine is a little higher but back on diuretics.  Suspect this is his baseline.  Repeat BMET in 2 wks.

## 2023-03-17 ENCOUNTER — Other Ambulatory Visit: Payer: Self-pay

## 2023-03-17 ENCOUNTER — Encounter: Payer: Self-pay | Admitting: Oncology

## 2023-03-17 MED ORDER — COLCHICINE 0.6 MG PO TABS
0.6000 mg | ORAL_TABLET | ORAL | 1 refills | Status: DC
  Filled 2023-03-17 – 2023-04-19 (×3): qty 45, 90d supply, fill #0

## 2023-03-17 MED ORDER — PREDNISONE 5 MG PO TABS
ORAL_TABLET | ORAL | 0 refills | Status: DC
  Filled 2023-03-17 (×2): qty 20, 8d supply, fill #0

## 2023-03-18 DIAGNOSIS — E662 Morbid (severe) obesity with alveolar hypoventilation: Secondary | ICD-10-CM | POA: Diagnosis not present

## 2023-03-18 DIAGNOSIS — G4733 Obstructive sleep apnea (adult) (pediatric): Secondary | ICD-10-CM | POA: Diagnosis not present

## 2023-03-21 ENCOUNTER — Inpatient Hospital Stay: Payer: Commercial Managed Care - PPO

## 2023-03-21 VITALS — BP 142/74 | HR 56 | Temp 98.1°F | Resp 17

## 2023-03-21 DIAGNOSIS — N184 Chronic kidney disease, stage 4 (severe): Secondary | ICD-10-CM | POA: Diagnosis not present

## 2023-03-21 DIAGNOSIS — D631 Anemia in chronic kidney disease: Secondary | ICD-10-CM | POA: Diagnosis not present

## 2023-03-21 DIAGNOSIS — D508 Other iron deficiency anemias: Secondary | ICD-10-CM

## 2023-03-21 MED ORDER — IRON SUCROSE 20 MG/ML IV SOLN
200.0000 mg | INTRAVENOUS | Status: DC
  Administered 2023-03-21: 200 mg via INTRAVENOUS
  Filled 2023-03-21: qty 10

## 2023-03-21 MED ORDER — SODIUM CHLORIDE 0.9% FLUSH
10.0000 mL | Freq: Once | INTRAVENOUS | Status: AC | PRN
  Administered 2023-03-21: 10 mL
  Filled 2023-03-21: qty 10

## 2023-03-21 NOTE — Patient Instructions (Signed)
 [facility] - A DEPT OF [facility]  Discharge Instructions: Thank you for choosing [facility] to provide your oncology and hematology care.  If you have a lab appointment with the Cancer Center, please go directly to the Cancer Center and check in at the registration area.  Wear comfortable clothing and clothing appropriate for easy access to any Portacath or PICC line.   We strive to give you quality time with your provider. You may need to reschedule your appointment if you arrive late (15 or more minutes).  Arriving late affects you and other patients whose appointments are after yours.  Also, if you miss three or more appointments without notifying the office, you may be dismissed from the clinic at the provider's discretion.      For prescription refill requests, have your pharmacy contact our office and allow 72 hours for refills to be completed.    Today you received the following chemotherapy and/or immunotherapy agents Venofer.      To help prevent nausea and vomiting after your treatment, we encourage you to take your nausea medication as directed.  BELOW ARE SYMPTOMS THAT SHOULD BE REPORTED IMMEDIATELY: *FEVER GREATER THAN 100.4 F (38 C) OR HIGHER *CHILLS OR SWEATING *NAUSEA AND VOMITING THAT IS NOT CONTROLLED WITH YOUR NAUSEA MEDICATION *UNUSUAL SHORTNESS OF BREATH *UNUSUAL BRUISING OR BLEEDING *URINARY PROBLEMS (pain or burning when urinating, or frequent urination) *BOWEL PROBLEMS (unusual diarrhea, constipation, pain near the anus) TENDERNESS IN MOUTH AND THROAT WITH OR WITHOUT PRESENCE OF ULCERS (sore throat, sores in mouth, or a toothache) UNUSUAL RASH, SWELLING OR PAIN  UNUSUAL VAGINAL DISCHARGE OR ITCHING   Items with * indicate a potential emergency and should be followed up as soon as possible or go to the Emergency Department if any problems should occur.  Please show the CHEMOTHERAPY ALERT CARD or IMMUNOTHERAPY  ALERT CARD at check-in to the Emergency Department and triage nurse.  Should you have questions after your visit or need to cancel or reschedule your appointment, please contact [facility] - A DEPT OF [facility]  [phone] and follow the prompts.  Office hours are 8:00 a.m. to 4:30 p.m. Monday - Friday. Please note that voicemails left after 4:00 p.m. may not be returned until the following business day.  We are closed weekends and major holidays. You have access to a nurse at all times for urgent questions. Please call the main number to the clinic [phone] and follow the prompts.  For any non-urgent questions, you may also contact your provider using MyChart. We now offer e-Visits for anyone 70 and older to request care online for non-urgent symptoms. For details visit [email or web address].   Also download the MyChart app! Go to the app store, search "MyChart", open the app, select [place], and log in with your MyChart username and password.

## 2023-03-24 ENCOUNTER — Encounter: Payer: Self-pay | Admitting: Oncology

## 2023-03-24 ENCOUNTER — Other Ambulatory Visit: Payer: Self-pay

## 2023-03-24 MED FILL — Tamsulosin HCl Cap 0.4 MG: ORAL | 30 days supply | Qty: 30 | Fill #2 | Status: AC

## 2023-03-25 ENCOUNTER — Other Ambulatory Visit: Payer: Self-pay

## 2023-03-25 ENCOUNTER — Ambulatory Visit
Admission: RE | Admit: 2023-03-25 | Discharge: 2023-03-25 | Disposition: A | Discharge status: Home / Self Care | Payer: Commercial Managed Care - PPO | Source: Ambulatory Visit | Attending: Cardiology | Admitting: Cardiology

## 2023-03-25 DIAGNOSIS — I5032 Chronic diastolic (congestive) heart failure: Secondary | ICD-10-CM | POA: Insufficient documentation

## 2023-03-25 DIAGNOSIS — I358 Other nonrheumatic aortic valve disorders: Secondary | ICD-10-CM | POA: Insufficient documentation

## 2023-03-25 DIAGNOSIS — I10 Essential (primary) hypertension: Secondary | ICD-10-CM | POA: Diagnosis not present

## 2023-03-25 DIAGNOSIS — I340 Nonrheumatic mitral (valve) insufficiency: Secondary | ICD-10-CM | POA: Diagnosis not present

## 2023-03-25 DIAGNOSIS — I2510 Atherosclerotic heart disease of native coronary artery without angina pectoris: Secondary | ICD-10-CM | POA: Diagnosis not present

## 2023-03-25 DIAGNOSIS — Z794 Long term (current) use of insulin: Secondary | ICD-10-CM | POA: Diagnosis not present

## 2023-03-25 DIAGNOSIS — E1165 Type 2 diabetes mellitus with hyperglycemia: Secondary | ICD-10-CM | POA: Diagnosis not present

## 2023-03-25 DIAGNOSIS — E1122 Type 2 diabetes mellitus with diabetic chronic kidney disease: Secondary | ICD-10-CM | POA: Diagnosis not present

## 2023-03-25 DIAGNOSIS — E1159 Type 2 diabetes mellitus with other circulatory complications: Secondary | ICD-10-CM | POA: Diagnosis not present

## 2023-03-25 DIAGNOSIS — Z951 Presence of aortocoronary bypass graft: Secondary | ICD-10-CM | POA: Insufficient documentation

## 2023-03-25 DIAGNOSIS — Z940 Kidney transplant status: Secondary | ICD-10-CM | POA: Diagnosis not present

## 2023-03-25 DIAGNOSIS — N184 Chronic kidney disease, stage 4 (severe): Secondary | ICD-10-CM | POA: Diagnosis not present

## 2023-03-25 DIAGNOSIS — E1142 Type 2 diabetes mellitus with diabetic polyneuropathy: Secondary | ICD-10-CM | POA: Diagnosis not present

## 2023-03-25 LAB — ECHOCARDIOGRAM COMPLETE
AR max vel: 2.01 cm2
AV Area VTI: 2.09 cm2
AV Area mean vel: 1.98 cm2
AV Mean grad: 8.3 mm[Hg]
AV Peak grad: 14.2 mm[Hg]
Ao pk vel: 1.88 m/s
Area-P 1/2: 3.79 cm2
S' Lateral: 4.6 cm

## 2023-03-25 MED ORDER — MOUNJARO 5 MG/0.5ML ~~LOC~~ SOAJ
5.0000 mg | SUBCUTANEOUS | 5 refills | Status: DC
  Filled 2023-03-25 – 2023-05-01 (×6): qty 2, 28d supply, fill #0
  Filled 2023-06-30: qty 2, 28d supply, fill #1
  Filled 2023-07-18 – 2023-07-23 (×2): qty 2, 28d supply, fill #2
  Filled 2023-08-18: qty 2, 28d supply, fill #3
  Filled 2023-09-09 – 2023-09-10 (×2): qty 2, 28d supply, fill #4
  Filled 2023-10-06: qty 2, 28d supply, fill #5

## 2023-03-25 MED ORDER — PERFLUTREN LIPID MICROSPHERE
1.0000 mL | INTRAVENOUS | Status: AC | PRN
  Administered 2023-03-25: 2 mL via INTRAVENOUS

## 2023-03-25 NOTE — Progress Notes (Signed)
*  PRELIMINARY RESULTS* Echocardiogram 2D Echocardiogram has been performed.  Cristela Blue 03/25/2023, 10:52 AM

## 2023-03-25 NOTE — Progress Notes (Signed)
*  PRELIMINARY RESULTS* Echocardiogram 2D Echocardiogram has been performed.  Cristela Blue 03/25/2023, 10:48 AM

## 2023-03-27 ENCOUNTER — Other Ambulatory Visit: Payer: Self-pay

## 2023-03-27 ENCOUNTER — Encounter: Payer: Self-pay | Admitting: Oncology

## 2023-03-30 DIAGNOSIS — M109 Gout, unspecified: Secondary | ICD-10-CM | POA: Diagnosis not present

## 2023-03-30 DIAGNOSIS — M79641 Pain in right hand: Secondary | ICD-10-CM | POA: Diagnosis not present

## 2023-03-30 DIAGNOSIS — E119 Type 2 diabetes mellitus without complications: Secondary | ICD-10-CM | POA: Diagnosis not present

## 2023-03-31 ENCOUNTER — Telehealth: Payer: Self-pay

## 2023-03-31 NOTE — Telephone Encounter (Signed)
 Hopefully he was able to get what he needed before leaving on his cruise

## 2023-03-31 NOTE — Telephone Encounter (Signed)
 I spoke with pts wife (DPR signed) pt has already gone to ED in [facility] to get med for gout. Nothing further needed at this time. Sending FYI to Dr Alphonsus Sias.

## 2023-03-31 NOTE — Telephone Encounter (Signed)
 [name]

## 2023-04-01 ENCOUNTER — Other Ambulatory Visit: Payer: Self-pay

## 2023-04-06 ENCOUNTER — Other Ambulatory Visit: Payer: Self-pay

## 2023-04-07 ENCOUNTER — Ambulatory Visit (INDEPENDENT_AMBULATORY_CARE_PROVIDER_SITE_OTHER)
Admission: RE | Admit: 2023-04-07 | Discharge: 2023-04-07 | Disposition: A | Discharge status: Home / Self Care | Payer: Commercial Managed Care - PPO | Source: Ambulatory Visit | Attending: Internal Medicine | Admitting: Internal Medicine

## 2023-04-07 ENCOUNTER — Ambulatory Visit (INDEPENDENT_AMBULATORY_CARE_PROVIDER_SITE_OTHER): Payer: Commercial Managed Care - PPO | Admitting: Internal Medicine

## 2023-04-07 ENCOUNTER — Encounter: Payer: Self-pay | Admitting: Internal Medicine

## 2023-04-07 ENCOUNTER — Other Ambulatory Visit: Payer: Self-pay

## 2023-04-07 VITALS — BP 110/70 | HR 59 | Temp 97.7°F | Wt 254.0 lb

## 2023-04-07 DIAGNOSIS — S2241XA Multiple fractures of ribs, right side, initial encounter for closed fracture: Secondary | ICD-10-CM | POA: Diagnosis not present

## 2023-04-07 DIAGNOSIS — I517 Cardiomegaly: Secondary | ICD-10-CM | POA: Diagnosis not present

## 2023-04-07 DIAGNOSIS — S2249XA Multiple fractures of ribs, unspecified side, initial encounter for closed fracture: Secondary | ICD-10-CM | POA: Insufficient documentation

## 2023-04-07 DIAGNOSIS — J939 Pneumothorax, unspecified: Secondary | ICD-10-CM | POA: Diagnosis not present

## 2023-04-07 MED ORDER — TRAMADOL HCL 50 MG PO TABS
50.0000 mg | ORAL_TABLET | Freq: Three times a day (TID) | ORAL | 0 refills | Status: DC | PRN
  Filled 2023-04-07: qty 30, 10d supply, fill #0

## 2023-04-07 NOTE — Progress Notes (Signed)
 Subjective:    Patient ID: Carl Peterson Mathey., male    DOB: 1961-02-21, 63 y.o.   MRN: [id number]  HPI Here due to ongoing pain from broken ribs With wife  Had gout flare 8 days ago while in [place]   Continues on the uloric  Didn't have the colchicine  or the hydrocodone  Went to urgent care---got injection (?steroid)---really helped Got 4 hydrocodone   Went on cruise Didn't get off at the first stop Got off at [place] next---looking for taxi and tripped on curb [name] hard---took the air out of me seen by [place] EMTs---cleaned up the scrapes Tried to keep going but had trouble getting air  X-rays by ship's physician 3 ribs fractured on right side Got toradol  injection Got 9 tramadol   2 more days of the cruise Back off ship 2 days ago--staye with cousins, then flew back yesterday  Still having some cough Trouble breathing--pain  Current Outpatient Medications on File Prior to Visit  Medication Sig Dispense Refill   albuterol  (PROVENTIL ) (2.5 MG/3ML) 0.083% nebulizer solution Take 3 mLs (2.5 mg total) by nebulization every 6 (six) hours as needed for wheezing or shortness of breath. 150 mL 0   albuterol  (VENTOLIN  HFA) 108 (90 Base) MCG/ACT inhaler Inhale 2 puffs into the lungs every 6 (six) hours as needed for wheezing or shortness of breath. 6.7 g 5   amoxicillin  (AMOXIL ) 500 MG capsule Take 4 capsules (2,000 mg total) by mouth as directed. Take one hour before dental appointment. 4 capsule 5   apixaban  (ELIQUIS ) 5 MG TABS tablet Take 1 tablet (5 mg total) by mouth 2 (two) times daily. 180 tablet 3   buPROPion  (WELLBUTRIN  SR) 150 MG 12 hr tablet Take 1 tablet (150 mg total) by mouth 2 (two) times daily. 180 tablet 3   cholecalciferol  (VITAMIN D3) 25 MCG (1000 UNIT) tablet Take 1,000 Units by mouth at bedtime.     colchicine  0.6 MG tablet Take 1 tablet (0.6 mg total) by mouth every other day. 45 tablet 1   Continuous Glucose Sensor (FREESTYLE LIBRE 3 SENSOR) MISC Use one  every 2 weeks. 6 each 3   dapagliflozin  propanediol (FARXIGA ) 10 MG TABS tablet Take 1 tablet (10 mg total) by mouth daily before breakfast. 90 tablet 3   febuxostat  (ULORIC ) 40 MG tablet Take 3 tablets (120 mg total) by mouth daily. 90 tablet 5   fluticasone  (FLONASE ) 50 MCG/ACT nasal spray Place 2 sprays into both nostrils daily. 16 g 11   gabapentin  (NEURONTIN ) 300 MG capsule Take 1 capsule (300 mg total) by mouth 2 (two) times daily. 180 capsule 0   HYDROcodone -acetaminophen  (NORCO/VICODIN) 5-325 MG tablet Take 1-2 tablets by mouth every 6 (six) hours as needed for moderate pain (pain score 4-6). 60 tablet 0   hydrocortisone  2.5 % cream Apply topically 3 (three) times daily as needed. 30 g 3   Insulin  Glargine (SEMGLEE  [place]) Inject 40 Units into the skin daily.     insulin  lispro (HUMALOG  KWIKPEN) 100 UNIT/ML KwikPen Inject 10-20 Units into the skin 3 (three) times daily. Per sliding scale     Insulin  Pen Needle (TECHLITE PEN NEEDLES) 31G X 5 MM MISC 4 (four) times daily. 400 each 3   Insulin  Pen Needle (TECHLITE PEN NEEDLES) 32G X 6 MM MISC Use 4 times a day 100 each 3   Insulin  Pen Needle (UNIFINE PENTIPS) 31G X 5 MM MISC Use 4 times daily as directed 400 each 3   lidocaine  (LIDODERM ) 5 % Place 1 patch  onto the skin daily.PLACE 1 PATCH ONTO THE SKIN FOR 12 (TWELVE) HOURS. REMOVE & DISCARD PATCH WITHIN 12 HOURS OR AS DIRECTED BY MD 60 patch 11   losartan  (COZAAR ) 25 MG tablet Take by mouth.     montelukast  (SINGULAIR ) 10 MG tablet Take 1 tablet (10 mg total) by mouth at bedtime. 30 tablet 11   Multiple Vitamin (MULTIVITAMIN WITH MINERALS) TABS tablet Take 1 tablet by mouth daily.     mycophenolate  (MYFORTIC ) 180 MG EC tablet Take 2 tablets (360 mg total) by mouth 2 (two) times daily. 360 tablet 3   omeprazole  (PRILOSEC) 20 MG capsule Take 1 capsule (20 mg total) by mouth daily. 30 capsule 11   potassium chloride  (MICRO-K ) 10 MEQ CR capsule Take by mouth.     potassium chloride  SA (KLOR-CON  M)  20 MEQ tablet Take 3 tablets (60 mEq total) by mouth 2 (two) times daily. 180 tablet 1   predniSONE  (DELTASONE ) 5 MG tablet Take 1 tablet (5 mg total) by mouth daily. 90 tablet 3   predniSONE  (DELTASONE ) 5 MG tablet Take 4 tablets (20 mg total) by mouth once daily for 2 days, THEN 3 tablets (15 mg total) once daily for 2 days, THEN 2 tablets (10 mg total) once daily for 2 days, THEN 1 tablet (5 mg total) once daily for 2 days. 20 tablet 0   rosuvastatin  (CRESTOR ) 10 MG tablet Take 1 tablet (10 mg total) by mouth daily. 90 tablet 3   sulfamethoxazole -trimethoprim  (BACTRIM ) 400-80 MG tablet Take 1 tablet by mouth 3 (three) times a week. 36 tablet 3   tacrolimus  (PROGRAF ) 1 MG capsule Take 2 capsules (2 mg total) by mouth every morning AND 1 capsule (1 mg total) every evening. 90 capsule 11   tacrolimus  (PROGRAF ) 1 MG capsule Take 2 capsules (2 mg total) by mouth every morning AND 1 capsule (1 mg total) every evening. 90 capsule 11   tamsulosin  (FLOMAX ) 0.4 MG CAPS capsule Take 1 capsule (0.4 mg total) by mouth daily. 30 capsule 11   Testosterone  20.25 MG/ACT (1.62%) GEL Place 2 Pump onto the skin daily. 150 g 3   tirzepatide  (MOUNJARO ) 5 MG/0.5ML Pen Inject 5 mg into the skin once a week. 2 mL 5   torsemide  (DEMADEX ) 20 MG tablet Take 4 tablets (80 mg total) by mouth 2 (two) times daily.     traMADol  (ULTRAM ) 50 MG tablet Take by mouth.     zinc  sulfate 220 (50 Zn) MG capsule Take 220 mg by mouth daily.     No current facility-administered medications on file prior to visit.    Allergies  Allergen Reactions   Contrast Media [Iodinated Contrast Media] Other (See Comments)    Kidney transplant   Iodine Other (See Comments)    Other reaction(s): Other (See Comments) Kidney transplant   Nsaids Other (See Comments)    Kidney transplant   Ibuprofen Other (See Comments)    Due to kidney transplant Due to kidney transplant Due to kidney transplant    Past Medical History:  Diagnosis Date    Anal condyloma    Anemia    Aortic atherosclerosis (HCC)    Aortic stenosis    a.) TTE 10/2021: Mod AS. AVA 1.06cm^2 (VTI). Mean grad ; b.) TTE 04/02/2022: no AV stenosis   Asthma, persistent controlled 02/25/2013   Attention or concentration deficit 04/26/2021   BPH with obstruction/lower urinary tract symptoms 09/25/2014   CAD (coronary artery disease) 2005   a.) LHC 1996 ->  HG stenosis mLAD -> PTCA/PCI with stent x 1 (unk type) -> complicated by CFA pseudoanurysm (required surg);  b.) LHC 08/10/2002  -> 95% LAD, 70% ISR LAD, 80% pOM, 70% mRCA --> CVTS consult; c.) 4v CABG 08/13/2002; c.) 03/2018 MV: Small, fixed inferoapical and apical sep defect. No isc. EF 47% (60-65% by 05/2018 TTE); d.) 02/2021 MV: small Apical inf fixed defect. No isc -> low risk.   Cardiomegaly    Cellulitis and abscess of left leg 07/22/2020   Chronic atrial fibrillation    a.) CHA2DS2VASc = 4 (CHF, HTN, vascular disease history, T2DM);  b.) rate/rhythm maintained without pharmacological intervention; chronically anticoagulated with apixaban    Chronic combined systolic and diastolic CHF (congestive heart failure)    a.) TTE 7/18 : EF 45-50%; b.) TTE 05/2018 : EF 60-65%, RVSP 74.8; c.) TTE 12/2018: EF 50-55%. Sev dil LA; d.) TTE 04/2019: EF 45-50%; e.) TTE 07/2021: EF 40-45%, G3DD; f.) TTE 10/2021: EF 40-45%, mild conc LVH, septal-lat dyssynchrony (LBBB), mildly red RVSF, mild-mod dil LA, mildly dil RA, mild-mod MS, mod AS; g.) TTE 04/02/2022: EF 40-45%, glob HK, LVH, red RVSF, RVE, sev LAE, mild-mod RAE, mild MR   Chronic venous stasis dermatitis of both lower extremities    Cytomegaloviral disease 2017   Diverticulosis of colon 04/22/2013   Dyspnea    Elevated RIGHT hemidiaphragm    Erectile dysfunction    a.) on topical TRT + Trimix (alprostadil/papaverine/phentolamine) injections   ESRD (end stage renal disease) (HCC)    a.) dialysis dependent 2004-2012; b.) s/p cadaveric RIGHT renal transplant 09/13/2010    Essential hypertension 12/17/2010   GERD (gastroesophageal reflux disease)    Gout    History of 2019 novel coronavirus disease (COVID-19) 06/30/2019   History of bilateral cataract extraction 10/2017   History of renal transplant 09/13/2010   a.) s/p cadaveric donor transplant 09/13/2010   Hx of bilateral cataract extraction 10/2017   Hyperlipidemia    Hypogonadism in male 09/25/2014   Ischemic cardiomyopathy    Left cephalic vein thrombosis 06/2019   Long term current use of anticoagulant    a.) apixaban    Long term current use of immunosuppressive drug    a.) mycophenolate  + prednisone  + tacrolimus    Major depressive disorder 10/04/2013   Mitral stenosis 11/07/2021   a.) TTE 11/07/2021: severe MAC, mild-mod MS (MPG 6 mmHg); b.) TTE 04/02/2022: no MV stenosis   Murmur    Obesity hypoventilation syndrome 03/31/2012   OSA treated with BiPAP 09/29/2012   PAH (pulmonary artery hypertension)    a. 04/2019 RHC: RA 19, RV 80/20, PA 78/31 (51), PCWP 25, CO/CI 7.69/3.26. PVR 3.25 --> Sev PAH, likely primarily PV HTN; b.) RHC 04/04/2022: mRA 13, mPA 40, mPCWP 20, PA sat 59, AO sat 92, CO 7.63, CI 3.26, PVR 2.6 --> mod portal venous HTN   Pancreatitis    S/P CABG x 4 08/13/2002   a.) LIMA-LAD, LRA-OM, SVG-D1, SVG-dRCA   S/P gastric bypass    Synovitis of finger 06/11/2019   Trigger finger, unspecified little finger 12/23/2018   Type 2 diabetes mellitus with hyperglycemia, with long-term current use of insulin  09/09/2014   a.) has FreeStyle Libre CGM   Ulcer    Left shin goes to wound care center at [facility] qweek   Wears glasses    Wears hearing aid in both ears     Past Surgical History:  Procedure Laterality Date   CARDIAC CATHETERIZATION N/A 08/10/2002   CATARACT EXTRACTION W/PHACO Right 10/29/2017  Procedure: CATARACT EXTRACTION PHACO AND INTRAOCULAR LENS PLACEMENT (IOC)  RIGHT DIABETIC;  Surgeon: Mittie Gaskin, MD;  Location: [facility];  Service: Ophthalmology;   Laterality: Right   CATARACT EXTRACTION W/PHACO Left 11/18/2017   Procedure: CATARACT EXTRACTION PHACO AND INTRAOCULAR LENS PLACEMENT (IOC) LEFT IVA/TOPICAL;  Surgeon: Mittie Gaskin, MD;  Location: [facility];  Service: Ophthalmology;  Laterality: Left   COLONOSCOPY WITH PROPOFOL  N/A 04/22/2013   Procedure: COLONOSCOPY WITH PROPOFOL ;  Surgeon: Toribio SHAUNNA Cedar, MD;  Location: [facility];  Service: Endoscopy;  Laterality: N/A;   CORONARY ANGIOPLASTY WITH STENT PLACEMENT N/A 1996   CORONARY ARTERY BYPASS GRAFT N/A 08/13/2002   Procedure: 4v CORONARY ARTERY BYPASS GRAFTING; Location: [facility]; Surgeon: Dessa Laine, MD   CYSTOSCOPY WITH INSERTION OF UROLIFT N/A 12/25/2021   Procedure: CYSTOSCOPY WITH INSERTION OF UROLIFT;  Surgeon: Twylla Glendia BROCKS, MD;  Location: [facility];  Service: Urology;  Laterality: N/A;   DG ANGIO AV SHUNT*L*     right and left upper arms   FASCIOTOMY  03/03/2012   Procedure: FASCIOTOMY;  Surgeon: Arley JONELLE Curia, MD;  Location: [facility];  Service: Orthopedics;  Laterality: Right;  FASCIOTOMY RIGHT SMALL FINGER   FASCIOTOMY Left 08/17/2013   Procedure: FASCIOTOMY LEFT RING;  Surgeon: Arley JONELLE Curia, MD;  Location: [facility];  Service: Orthopedics;  Laterality: Left;   INCISION AND DRAINAGE ABSCESS Left 10/15/2015   Procedure: INCISION AND DRAINAGE ABSCESS;  Surgeon: Laneta JULIANNA Luna, MD;  Location: [facility];  Service: General;  Laterality: Left;   KIDNEY TRANSPLANT  09/13/2010   cadaver--at [facility]   LASER ABLATION CONDOLAMATA N/A 01/08/2023   Procedure: LASER REMOVAL ABLATION OF CONDYLOMATA;  Surgeon: Sheldon Standing, MD;  Location: [facility];  Service: General;  Laterality: N/A;  GEN AND LOCAL   RECTAL EXAM UNDER ANESTHESIA N/A 01/08/2023   Procedure: ANORECTAL EXAM UNDER ANESTHESIA;  Surgeon: Sheldon Standing, MD;  Location: [facility];  Service: General;  Laterality: N/A;   RIGHT HEART CATH N/A 11/15/2016   Procedure: RIGHT HEART  CATH;  Surgeon: Darron Deatrice LABOR, MD;  Location: [facility];  Service: Cardiovascular;  Laterality: N/A;   RIGHT HEART CATH N/A 05/03/2019   Procedure: RIGHT HEART CATH;  Surgeon: [name], MD;  Location: [facility];  Service: Cardiovascular;  Laterality: N/A;   RIGHT HEART CATH N/A 04/04/2022   Procedure: RIGHT HEART CATH;  Surgeon: [name], MD;  Location: [facility];  Service: Cardiovascular;  Laterality: N/A;   ROUX-EN-Y GASTRIC BYPASS N/A 2015   TYMPANIC MEMBRANE REPAIR Left 03/2010   VASECTOMY     WART FULGURATION N/A 05/31/2022   Procedure: FULGURATION ANAL WART;  Surgeon: Tye Millet, DO;  Location: [facility];  Service: General;  Laterality: N/A;    Family History  Problem Relation Age of Onset   Heart disease Father    Kidney failure Father    Kidney disease Father    Diabetes Maternal Grandmother    Breast cancer Maternal Grandmother    Valvular heart disease Mother    Liver cancer Paternal Uncle    Liver cancer Paternal Grandmother    Prostate cancer Neg Hx     Social History   Socioeconomic History   Marital status: Married    Spouse name: Not on file   Number of children: 2   Years of education: 14   Highest education level: Associate degree: academic program  Occupational History   Occupation: Set Designer  manager/retired    Employer: unemployed    Comment: disabled due to kidney failure  Tobacco Use   Smoking status: Former    Types: Cigars    Quit date: 09/02/1994    Years since quitting: 28.6    Passive exposure: Never   Smokeless tobacco: Never  Vaping Use   Vaping status: Never Used  Substance and Sexual Activity   Alcohol use: Not Currently    Comment: occ   Drug use: No   Sexual activity: Not on file  Other Topics Concern   Not on file  Social History Narrative   Has living will   Wife is health care POA---son Donnice is alternate   Would accept resuscitation attempts   No tube feedings if  cognitively unaware   Social Drivers of Corporate Investment Banker Strain: Not on file  Food Insecurity: Low Risk  (02/14/2023)   Received from [facility]   Hunger Vital Sign    Worried About Running Out of Food in the Last Year: Never true    Ran Out of Food in the Last Year: Never true  Transportation Needs: No Transportation Needs (02/14/2023)   Received from [facility]    In the past 12 months, has lack of reliable transportation kept you from medical appointments, meetings, work or from getting things needed for daily living? : No  Physical Activity: Not on file  Stress: Not on file  Social Connections: Not on file  Intimate Partner Violence: Not At Risk (02/04/2023)   Humiliation, Afraid, Rape, and Kick questionnaire    Fear of Current or Ex-Partner: No    Emotionally Abused: No    Physically Abused: No    Sexually Abused: No   Review of Systems No fever Trying to deep breath---hard with the pain     Objective:   Physical Exam Constitutional:      Appearance: Normal appearance.     Comments: Pain with deep breaths but no distress  Cardiovascular:     Rate and Rhythm: Normal rate and regular rhythm.     Heart sounds: No murmur heard.    No friction rub. No gallop.  Pulmonary:     Effort: Pulmonary effort is normal.     Breath sounds: Normal breath sounds. No wheezing or rales.  Neurological:     Mental Status: He is alert.            Assessment & Plan:

## 2023-04-07 NOTE — Assessment & Plan Note (Addendum)
 Told he had 3 rib fractures from the fall Medical records from cruise ship also noted hemothroax and pneumothorax Looks comfortable Will recheck x-ray to be sure no sig findings now---I see no pneumo or hemo thorax (still awaiting overread)  Will refill tramadol  (not hydrocodone )

## 2023-04-08 ENCOUNTER — Emergency Department: Payer: Commercial Managed Care - PPO

## 2023-04-08 ENCOUNTER — Inpatient Hospital Stay
Admission: EM | Admit: 2023-04-08 | Discharge: 2023-04-13 | DRG: 193 | Disposition: A | Discharge status: Home Health | Payer: Commercial Managed Care - PPO | Attending: Internal Medicine | Admitting: Internal Medicine

## 2023-04-08 ENCOUNTER — Other Ambulatory Visit: Payer: Self-pay

## 2023-04-08 DIAGNOSIS — J9602 Acute respiratory failure with hypercapnia: Secondary | ICD-10-CM | POA: Diagnosis present

## 2023-04-08 DIAGNOSIS — R918 Other nonspecific abnormal finding of lung field: Secondary | ICD-10-CM | POA: Diagnosis not present

## 2023-04-08 DIAGNOSIS — N3289 Other specified disorders of bladder: Secondary | ICD-10-CM | POA: Diagnosis not present

## 2023-04-08 DIAGNOSIS — D696 Thrombocytopenia, unspecified: Secondary | ICD-10-CM | POA: Diagnosis present

## 2023-04-08 DIAGNOSIS — Z9841 Cataract extraction status, right eye: Secondary | ICD-10-CM

## 2023-04-08 DIAGNOSIS — J9601 Acute respiratory failure with hypoxia: Secondary | ICD-10-CM

## 2023-04-08 DIAGNOSIS — I5A Non-ischemic myocardial injury (non-traumatic): Secondary | ICD-10-CM | POA: Diagnosis not present

## 2023-04-08 DIAGNOSIS — E669 Obesity, unspecified: Secondary | ICD-10-CM | POA: Diagnosis present

## 2023-04-08 DIAGNOSIS — M069 Rheumatoid arthritis, unspecified: Secondary | ICD-10-CM | POA: Diagnosis present

## 2023-04-08 DIAGNOSIS — J1008 Influenza due to other identified influenza virus with other specified pneumonia: Principal | ICD-10-CM | POA: Diagnosis present

## 2023-04-08 DIAGNOSIS — Z9884 Bariatric surgery status: Secondary | ICD-10-CM

## 2023-04-08 DIAGNOSIS — I21A1 Myocardial infarction type 2: Secondary | ICD-10-CM | POA: Diagnosis present

## 2023-04-08 DIAGNOSIS — W19XXXA Unspecified fall, initial encounter: Secondary | ICD-10-CM | POA: Diagnosis present

## 2023-04-08 DIAGNOSIS — J101 Influenza due to other identified influenza virus with other respiratory manifestations: Principal | ICD-10-CM

## 2023-04-08 DIAGNOSIS — E119 Type 2 diabetes mellitus without complications: Secondary | ICD-10-CM | POA: Diagnosis present

## 2023-04-08 DIAGNOSIS — I5042 Chronic combined systolic (congestive) and diastolic (congestive) heart failure: Secondary | ICD-10-CM | POA: Diagnosis present

## 2023-04-08 DIAGNOSIS — R059 Cough, unspecified: Secondary | ICD-10-CM | POA: Diagnosis not present

## 2023-04-08 DIAGNOSIS — S2231XA Fracture of one rib, right side, initial encounter for closed fracture: Secondary | ICD-10-CM | POA: Diagnosis present

## 2023-04-08 DIAGNOSIS — Z794 Long term (current) use of insulin: Secondary | ICD-10-CM | POA: Diagnosis not present

## 2023-04-08 DIAGNOSIS — Z7982 Long term (current) use of aspirin: Secondary | ICD-10-CM

## 2023-04-08 DIAGNOSIS — R197 Diarrhea, unspecified: Secondary | ICD-10-CM | POA: Diagnosis present

## 2023-04-08 DIAGNOSIS — R270 Ataxia, unspecified: Secondary | ICD-10-CM | POA: Diagnosis not present

## 2023-04-08 DIAGNOSIS — Z992 Dependence on renal dialysis: Secondary | ICD-10-CM | POA: Diagnosis not present

## 2023-04-08 DIAGNOSIS — E1165 Type 2 diabetes mellitus with hyperglycemia: Secondary | ICD-10-CM | POA: Diagnosis present

## 2023-04-08 DIAGNOSIS — D631 Anemia in chronic kidney disease: Secondary | ICD-10-CM | POA: Diagnosis present

## 2023-04-08 DIAGNOSIS — Z9842 Cataract extraction status, left eye: Secondary | ICD-10-CM

## 2023-04-08 DIAGNOSIS — Z91041 Radiographic dye allergy status: Secondary | ICD-10-CM

## 2023-04-08 DIAGNOSIS — I130 Hypertensive heart and chronic kidney disease with heart failure and stage 1 through stage 4 chronic kidney disease, or unspecified chronic kidney disease: Secondary | ICD-10-CM | POA: Diagnosis present

## 2023-04-08 DIAGNOSIS — E1122 Type 2 diabetes mellitus with diabetic chronic kidney disease: Secondary | ICD-10-CM | POA: Diagnosis not present

## 2023-04-08 DIAGNOSIS — I482 Chronic atrial fibrillation, unspecified: Secondary | ICD-10-CM | POA: Diagnosis present

## 2023-04-08 DIAGNOSIS — N179 Acute kidney failure, unspecified: Secondary | ICD-10-CM | POA: Diagnosis not present

## 2023-04-08 DIAGNOSIS — J189 Pneumonia, unspecified organism: Secondary | ICD-10-CM

## 2023-04-08 DIAGNOSIS — I4821 Permanent atrial fibrillation: Secondary | ICD-10-CM | POA: Diagnosis present

## 2023-04-08 DIAGNOSIS — N400 Enlarged prostate without lower urinary tract symptoms: Secondary | ICD-10-CM | POA: Diagnosis present

## 2023-04-08 DIAGNOSIS — S199XXA Unspecified injury of neck, initial encounter: Secondary | ICD-10-CM | POA: Diagnosis not present

## 2023-04-08 DIAGNOSIS — I10 Essential (primary) hypertension: Secondary | ICD-10-CM | POA: Diagnosis present

## 2023-04-08 DIAGNOSIS — Z1152 Encounter for screening for COVID-19: Secondary | ICD-10-CM | POA: Diagnosis not present

## 2023-04-08 DIAGNOSIS — S0990XA Unspecified injury of head, initial encounter: Secondary | ICD-10-CM | POA: Diagnosis not present

## 2023-04-08 DIAGNOSIS — E1129 Type 2 diabetes mellitus with other diabetic kidney complication: Secondary | ICD-10-CM | POA: Diagnosis present

## 2023-04-08 DIAGNOSIS — R0989 Other specified symptoms and signs involving the circulatory and respiratory systems: Secondary | ICD-10-CM | POA: Diagnosis not present

## 2023-04-08 DIAGNOSIS — Z8616 Personal history of COVID-19: Secondary | ICD-10-CM | POA: Diagnosis not present

## 2023-04-08 DIAGNOSIS — G9341 Metabolic encephalopathy: Secondary | ICD-10-CM | POA: Diagnosis present

## 2023-04-08 DIAGNOSIS — Z955 Presence of coronary angioplasty implant and graft: Secondary | ICD-10-CM

## 2023-04-08 DIAGNOSIS — R0602 Shortness of breath: Secondary | ICD-10-CM | POA: Diagnosis not present

## 2023-04-08 DIAGNOSIS — Z79899 Other long term (current) drug therapy: Secondary | ICD-10-CM

## 2023-04-08 DIAGNOSIS — G4733 Obstructive sleep apnea (adult) (pediatric): Secondary | ICD-10-CM | POA: Diagnosis not present

## 2023-04-08 DIAGNOSIS — Z6836 Body mass index (BMI) 36.0-36.9, adult: Secondary | ICD-10-CM

## 2023-04-08 DIAGNOSIS — Z888 Allergy status to other drugs, medicaments and biological substances status: Secondary | ICD-10-CM

## 2023-04-08 DIAGNOSIS — I5022 Chronic systolic (congestive) heart failure: Secondary | ICD-10-CM | POA: Diagnosis not present

## 2023-04-08 DIAGNOSIS — Z7901 Long term (current) use of anticoagulants: Secondary | ICD-10-CM

## 2023-04-08 DIAGNOSIS — Z951 Presence of aortocoronary bypass graft: Secondary | ICD-10-CM

## 2023-04-08 DIAGNOSIS — E785 Hyperlipidemia, unspecified: Secondary | ICD-10-CM | POA: Diagnosis not present

## 2023-04-08 DIAGNOSIS — E66812 Obesity, class 2: Secondary | ICD-10-CM | POA: Diagnosis present

## 2023-04-08 DIAGNOSIS — I700 Atherosclerosis of aorta: Secondary | ICD-10-CM | POA: Diagnosis present

## 2023-04-08 DIAGNOSIS — J159 Unspecified bacterial pneumonia: Secondary | ICD-10-CM | POA: Diagnosis present

## 2023-04-08 DIAGNOSIS — Z886 Allergy status to analgesic agent status: Secondary | ICD-10-CM

## 2023-04-08 DIAGNOSIS — I5032 Chronic diastolic (congestive) heart failure: Secondary | ICD-10-CM

## 2023-04-08 DIAGNOSIS — T8619 Other complication of kidney transplant: Secondary | ICD-10-CM | POA: Diagnosis not present

## 2023-04-08 DIAGNOSIS — I517 Cardiomegaly: Secondary | ICD-10-CM | POA: Diagnosis not present

## 2023-04-08 DIAGNOSIS — Z940 Kidney transplant status: Secondary | ICD-10-CM | POA: Diagnosis not present

## 2023-04-08 DIAGNOSIS — Z841 Family history of disorders of kidney and ureter: Secondary | ICD-10-CM

## 2023-04-08 DIAGNOSIS — Z974 Presence of external hearing-aid: Secondary | ICD-10-CM

## 2023-04-08 DIAGNOSIS — N184 Chronic kidney disease, stage 4 (severe): Secondary | ICD-10-CM | POA: Diagnosis present

## 2023-04-08 DIAGNOSIS — S2241XA Multiple fractures of ribs, right side, initial encounter for closed fracture: Secondary | ICD-10-CM | POA: Diagnosis not present

## 2023-04-08 DIAGNOSIS — E1169 Type 2 diabetes mellitus with other specified complication: Secondary | ICD-10-CM | POA: Diagnosis present

## 2023-04-08 DIAGNOSIS — Z7960 Long term (current) use of unspecified immunomodulators and immunosuppressants: Secondary | ICD-10-CM

## 2023-04-08 DIAGNOSIS — Z87891 Personal history of nicotine dependence: Secondary | ICD-10-CM

## 2023-04-08 DIAGNOSIS — Z8249 Family history of ischemic heart disease and other diseases of the circulatory system: Secondary | ICD-10-CM

## 2023-04-08 DIAGNOSIS — I2510 Atherosclerotic heart disease of native coronary artery without angina pectoris: Secondary | ICD-10-CM | POA: Diagnosis present

## 2023-04-08 DIAGNOSIS — Z7985 Long-term (current) use of injectable non-insulin antidiabetic drugs: Secondary | ICD-10-CM

## 2023-04-08 DIAGNOSIS — Z833 Family history of diabetes mellitus: Secondary | ICD-10-CM

## 2023-04-08 DIAGNOSIS — R9431 Abnormal electrocardiogram [ECG] [EKG]: Secondary | ICD-10-CM | POA: Diagnosis not present

## 2023-04-08 DIAGNOSIS — J168 Pneumonia due to other specified infectious organisms: Secondary | ICD-10-CM | POA: Diagnosis not present

## 2023-04-08 LAB — CBC
HCT: 35.4 % — ABNORMAL LOW (ref 39.0–52.0)
Hemoglobin: 11.1 g/dL — ABNORMAL LOW (ref 13.0–17.0)
MCH: 28.8 pg (ref 26.0–34.0)
MCHC: 31.4 g/dL (ref 30.0–36.0)
MCV: 91.7 fL (ref 80.0–100.0)
Platelets: 127 10*3/uL — ABNORMAL LOW (ref 150–400)
RBC: 3.86 MIL/uL — ABNORMAL LOW (ref 4.22–5.81)
RDW: 15.7 % — ABNORMAL HIGH (ref 11.5–15.5)
WBC: 10.2 10*3/uL (ref 4.0–10.5)
nRBC: 0 % (ref 0.0–0.2)

## 2023-04-08 LAB — LACTIC ACID, PLASMA: Lactic Acid, Venous: 0.9 mmol/L (ref 0.5–1.9)

## 2023-04-08 LAB — BASIC METABOLIC PANEL
Anion gap: 15 (ref 5–15)
BUN: 83 mg/dL — ABNORMAL HIGH (ref 8–23)
CO2: 21 mmol/L — ABNORMAL LOW (ref 22–32)
Calcium: 8.6 mg/dL — ABNORMAL LOW (ref 8.9–10.3)
Chloride: 100 mmol/L (ref 98–111)
Creatinine, Ser: 2.55 mg/dL — ABNORMAL HIGH (ref 0.61–1.24)
GFR, Estimated: 28 mL/min — ABNORMAL LOW (ref >60)
Glucose, Bld: 103 mg/dL — ABNORMAL HIGH (ref 70–99)
Potassium: 3.9 mmol/L (ref 3.5–5.1)
Sodium: 136 mmol/L (ref 135–145)

## 2023-04-08 LAB — BLOOD GAS, VENOUS: Patient temperature: 37

## 2023-04-08 LAB — RESP PANEL BY RT-PCR (RSV, FLU A&B, COVID)  RVPGX2
Influenza A by PCR: POSITIVE — AB
Influenza B by PCR: NEGATIVE
Resp Syncytial Virus by PCR: NEGATIVE
SARS Coronavirus 2 by RT PCR: NEGATIVE

## 2023-04-08 LAB — TROPONIN I (HIGH SENSITIVITY)
Troponin I (High Sensitivity): 442 ng/L (ref <18)
Troponin I (High Sensitivity): 465 ng/L (ref <18)

## 2023-04-08 MED ORDER — HEPARIN (PORCINE) 25000 UT/250ML-% IV SOLN
1450.0000 [IU]/h | INTRAVENOUS | Status: DC
Start: 2023-04-08 — End: 2023-04-10
  Administered 2023-04-09 (×2): 1350 [IU]/h via INTRAVENOUS
  Filled 2023-04-08 (×2): qty 250

## 2023-04-08 MED ORDER — ACETAMINOPHEN 325 MG PO TABS
650.0000 mg | ORAL_TABLET | Freq: Four times a day (QID) | ORAL | Status: DC | PRN
  Administered 2023-04-09: 650 mg via ORAL
  Filled 2023-04-08 (×2): qty 2

## 2023-04-08 MED ORDER — OXYCODONE-ACETAMINOPHEN 5-325 MG PO TABS
1.0000 | ORAL_TABLET | ORAL | Status: DC | PRN
  Administered 2023-04-09 – 2023-04-12 (×4): 1 via ORAL
  Filled 2023-04-08 (×5): qty 1

## 2023-04-08 MED ORDER — METHYLPREDNISOLONE SODIUM SUCC 125 MG IJ SOLR
125.0000 mg | Freq: Once | INTRAMUSCULAR | Status: AC
  Administered 2023-04-09: 125 mg via INTRAVENOUS
  Filled 2023-04-08: qty 2

## 2023-04-08 MED ORDER — SODIUM CHLORIDE 0.9 % IV SOLN
2.0000 g | Freq: Once | INTRAVENOUS | Status: AC
  Administered 2023-04-08: 2 g via INTRAVENOUS
  Filled 2023-04-08: qty 20

## 2023-04-08 MED ORDER — INSULIN ASPART 100 UNIT/ML IJ SOLN
0.0000 [IU] | Freq: Three times a day (TID) | INTRAMUSCULAR | Status: DC
Start: 2023-04-09 — End: 2023-04-10
  Administered 2023-04-09 (×2): 3 [IU] via SUBCUTANEOUS
  Administered 2023-04-10: 4 [IU] via SUBCUTANEOUS
  Filled 2023-04-08 (×3): qty 1

## 2023-04-08 MED ORDER — OSELTAMIVIR PHOSPHATE 30 MG PO CAPS
30.0000 mg | ORAL_CAPSULE | Freq: Two times a day (BID) | ORAL | Status: DC
  Administered 2023-04-09 – 2023-04-11 (×5): 30 mg via ORAL
  Filled 2023-04-08 (×7): qty 1

## 2023-04-08 MED ORDER — OSELTAMIVIR PHOSPHATE 30 MG PO CAPS
30.0000 mg | ORAL_CAPSULE | Freq: Once | ORAL | Status: DC
  Filled 2023-04-08: qty 1

## 2023-04-08 MED ORDER — ONDANSETRON HCL 4 MG/2ML IJ SOLN
4.0000 mg | Freq: Three times a day (TID) | INTRAMUSCULAR | Status: DC | PRN
  Administered 2023-04-09 – 2023-04-12 (×2): 4 mg via INTRAVENOUS
  Filled 2023-04-08 (×2): qty 2

## 2023-04-08 MED ORDER — SODIUM CHLORIDE 0.9 % IV SOLN
500.0000 mg | Freq: Once | INTRAVENOUS | Status: AC
  Administered 2023-04-08: 500 mg via INTRAVENOUS
  Filled 2023-04-08: qty 5

## 2023-04-08 MED ORDER — METHYLPREDNISOLONE SODIUM SUCC 125 MG IJ SOLR
80.0000 mg | INTRAMUSCULAR | Status: DC
  Administered 2023-04-09: 80 mg via INTRAVENOUS
  Filled 2023-04-08: qty 2

## 2023-04-08 MED ORDER — ASPIRIN 81 MG PO TBEC
81.0000 mg | DELAYED_RELEASE_TABLET | Freq: Every day | ORAL | Status: DC
  Administered 2023-04-09 – 2023-04-13 (×5): 81 mg via ORAL
  Filled 2023-04-08 (×5): qty 1

## 2023-04-08 MED ORDER — METHYLPREDNISOLONE SODIUM SUCC 40 MG IJ SOLR: 40.0000 mg | Freq: Two times a day (BID) | INTRAMUSCULAR | Status: DC

## 2023-04-08 MED ORDER — ALBUTEROL SULFATE (2.5 MG/3ML) 0.083% IN NEBU
2.5000 mg | INHALATION_SOLUTION | RESPIRATORY_TRACT | Status: DC | PRN
  Administered 2023-04-09 – 2023-04-10 (×2): 2.5 mg via RESPIRATORY_TRACT
  Filled 2023-04-08 (×2): qty 3

## 2023-04-08 MED ORDER — INSULIN ASPART 100 UNIT/ML IJ SOLN
0.0000 [IU] | Freq: Every day | INTRAMUSCULAR | Status: DC
  Administered 2023-04-09: 2 [IU] via SUBCUTANEOUS
  Filled 2023-04-08: qty 1

## 2023-04-08 MED ORDER — DM-GUAIFENESIN ER 30-600 MG PO TB12
1.0000 | ORAL_TABLET | Freq: Two times a day (BID) | ORAL | Status: DC | PRN
  Administered 2023-04-09 – 2023-04-10 (×2): 1 via ORAL
  Filled 2023-04-08 (×2): qty 1

## 2023-04-08 NOTE — ED Triage Notes (Signed)
 Per family pt was on cruise earlier last week, pt fell while on cruise and fractured 3 ribs on the right, over the past few days pt has had fatigue shortness of breath and cough. Per family pts PCP said there was blood in his lung from the fall.

## 2023-04-08 NOTE — Progress Notes (Signed)
 PHARMACY - ANTICOAGULATION CONSULT NOTE  Pharmacy Consult for Heparin   Indication: chest pain/ACS  Allergies  Allergen Reactions   Contrast Media [Iodinated Contrast Media] Other (See Comments)    Kidney transplant   Iodine Other (See Comments)    Other reaction(s): Other (See Comments) Kidney transplant   Nsaids Other (See Comments)    Kidney transplant   Ibuprofen Other (See Comments)    Due to kidney transplant Due to kidney transplant Due to kidney transplant    Patient Measurements: Height: 5' 10 (177.8 cm) Weight: 111.1 kg (245 lb) IBW/kg (Calculated) : 73 Heparin  Dosing Weight:  97.2 kg   Vital Signs: Temp: 98.3 F (36.8 C) (01/14 2330) Temp Source: Oral (01/14 1918) BP: 128/64 (01/14 2136) Pulse Rate: 67 (01/14 2136)  Labs: Recent Labs    04/08/23 1920 04/08/23 2148  HGB 11.1*  --   HCT 35.4*  --   PLT 127*  --   CREATININE 2.55*  --   TROPONINIHS 442* 465*    Estimated Creatinine Clearance: 37 mL/min (A) (by C-G formula based on SCr of 2.55 mg/dL (H)).   Medical History: Past Medical History:  Diagnosis Date   Anal condyloma    Anemia    Aortic atherosclerosis (HCC)    Aortic stenosis    a.) TTE 10/2021: Mod AS. AVA 1.06cm^2 (VTI). Mean grad ; b.) TTE 04/02/2022: no AV stenosis   Asthma, persistent controlled 02/25/2013   Attention or concentration deficit 04/26/2021   BPH with obstruction/lower urinary tract symptoms 09/25/2014   CAD (coronary artery disease) 2005   a.) LHC 1996 -> HG stenosis mLAD -> PTCA/PCI with stent x 1 (unk type) -> complicated by CFA pseudoanurysm (required surg);  b.) LHC 08/10/2002  -> 95% LAD, 70% ISR LAD, 80% pOM, 70% mRCA --> CVTS consult; c.) 4v CABG 08/13/2002; c.) 03/2018 MV: Small, fixed inferoapical and apical sep defect. No isc. EF 47% (60-65% by 05/2018 TTE); d.) 02/2021 MV: small Apical inf fixed defect. No isc -> low risk.   Cardiomegaly    Cellulitis and abscess of left leg 07/22/2020   Chronic atrial  fibrillation    a.) CHA2DS2VASc = 4 (CHF, HTN, vascular disease history, T2DM);  b.) rate/rhythm maintained without pharmacological intervention; chronically anticoagulated with apixaban    Chronic combined systolic and diastolic CHF (congestive heart failure)    a.) TTE 7/18 : EF 45-50%; b.) TTE 05/2018 : EF 60-65%, RVSP 74.8; c.) TTE 12/2018: EF 50-55%. Sev dil LA; d.) TTE 04/2019: EF 45-50%; e.) TTE 07/2021: EF 40-45%, G3DD; f.) TTE 10/2021: EF 40-45%, mild conc LVH, septal-lat dyssynchrony (LBBB), mildly red RVSF, mild-mod dil LA, mildly dil RA, mild-mod MS, mod AS; g.) TTE 04/02/2022: EF 40-45%, glob HK, LVH, red RVSF, RVE, sev LAE, mild-mod RAE, mild MR   Chronic venous stasis dermatitis of both lower extremities    Cytomegaloviral disease 2017   Diverticulosis of colon 04/22/2013   Dyspnea    Elevated RIGHT hemidiaphragm    Erectile dysfunction    a.) on topical TRT + Trimix (alprostadil/papaverine/phentolamine) injections   ESRD (end stage renal disease) (HCC)    a.) dialysis dependent 2004-2012; b.) s/p cadaveric RIGHT renal transplant 09/13/2010   Essential hypertension 12/17/2010   GERD (gastroesophageal reflux disease)    Gout    History of 2019 novel coronavirus disease (COVID-19) 06/30/2019   History of bilateral cataract extraction 10/2017   History of renal transplant 09/13/2010   a.) s/p cadaveric donor transplant 09/13/2010   Hx of bilateral cataract  extraction 10/2017   Hyperlipidemia    Hypogonadism in male 09/25/2014   Ischemic cardiomyopathy    Left cephalic vein thrombosis 06/2019   Long term current use of anticoagulant    a.) apixaban    Long term current use of immunosuppressive drug    a.) mycophenolate  + prednisone  + tacrolimus    Major depressive disorder 10/04/2013   Mitral stenosis 11/07/2021   a.) TTE 11/07/2021: severe MAC, mild-mod MS (MPG 6 mmHg); b.) TTE 04/02/2022: no MV stenosis   Murmur    Obesity hypoventilation syndrome 03/31/2012   OSA treated with  BiPAP 09/29/2012   PAH (pulmonary artery hypertension)    a. 04/2019 RHC: RA 19, RV 80/20, PA 78/31 (51), PCWP 25, CO/CI 7.69/3.26. PVR 3.25 --> Sev PAH, likely primarily PV HTN; b.) RHC 04/04/2022: mRA 13, mPA 40, mPCWP 20, PA sat 59, AO sat 92, CO 7.63, CI 3.26, PVR 2.6 --> mod portal venous HTN   Pancreatitis    S/P CABG x 4 08/13/2002   a.) LIMA-LAD, LRA-OM, SVG-D1, SVG-dRCA   S/P gastric bypass    Synovitis of finger 06/11/2019   Trigger finger, unspecified little finger 12/23/2018   Type 2 diabetes mellitus with hyperglycemia, with long-term current use of insulin  09/09/2014   a.) has FreeStyle Libre CGM   Ulcer    Left shin goes to wound care center at [facility]   Wears glasses    Wears hearing aid in both ears     Medications:  (Not in a hospital admission)   Assessment: Pharmacy consulted to dose heparin  in this 63 year old male admitted with Flu,  ACS/NSTEMI.  Pt was on Eliquis  5 mg PO  BID for Afib.  Last dose on 1/14 AM.  CrCl = 37 ml/min   Goal of Therapy:  Heparin  level 0.3-0.7 units/ml aPTT 66 - 102  seconds Monitor platelets by anticoagulation protocol: Yes   Plan:  Pt had dose of Eliquis  on 1/14 AM so will not bolus heparin . Start heparin  infusion at 1350 units/hr - use aPTT to guide dosing until correlating with HL - Will draw aPTT and HL 6 hrs after start of drip - will check CBC daily   Sarahmarie Leavey D 04/08/2023,11:59 PM

## 2023-04-08 NOTE — H&P (Signed)
 History and Physical    [name]. FMW:[id number] DOB: 1961/01/10 DOA: 04/08/2023  Referring MD/NP/PA:   PCP: [name], MD   Patient coming from:  The patient is coming from home.     Chief Complaint: SOB  HPI: Carl Corwyn Vora. is a 63 y.o. male with medical history significant of s/p of renal transplant on immunosuppressant, CKD-4, HTN, HLD, DM, CAD, CBAG, sCHF with EF 45-50%, gout, depression, ADHD, BPH, former smoker, OSA on BiPAP, obesity, left cephalic vein thrombus and A-fib on Eliquis , who presents with shortness of breath.  Pt states that he pt was on cruise earlier last week. He fell while on cruise and fractured 3 ribs on the right.  In the past 4 days, he has cough with yellow-colored sputum production and SOB which has been progressively worsening.  He also has subjective fever, chills, body aches, malaise.  He has right-sided rib cage pain, which is sharp, constant, moderate, nonradiating, aggravated by movement.  He reports diarrhea with 3-4 times of watery diarrhea each day, no nausea vomiting or abdominal pain.  He has dysuria, no burning or urination.  No hematuria.  Patient is drowsy during interview, but is easily arousable, and is oriented x 3.  Data reviewed independently and ED Course: pt was found to have positive PCR for flu A, WBC 10.2, troponin 442, renal function within normal range for baseline, temperature normal, blood pressure 128/64, heart rate 31, 59, 67, RR 23, oxygen  saturation 92% on room air, which improved to 100% on 2 L oxygen .  VBG with pH 7.24, CO2 65.  CT of head and CT of C-spine is negative for acute injury.  Patient is admitted to PCU as inpatient.  Chest x-ray: Cardiomegaly with central congestion. Streaky bilateral perihilar opacities question atypical infection  CT of chest/abdomen/pelvis Nondisplaced right posterior 3rd through 5th rib fractures. No pneumothorax. Suspected left upper lobe pneumonia. Mildly  thick-walled bladder with perivesical stranding, correlate for cystitis.  Aortic Atherosclerosis (ICD10-I70.0).    EKG: I have personally reviewed.  A-fib, QTc 493, PVC, poor R wave progression, old left bundle blockage   Review of Systems:   General: Has subjective fevers, chills, no body weight gain, has poor appetite, has fatigue HEENT: no blurry vision, hearing changes or sore throat Respiratory: has dyspnea, coughing, no wheezing CV: has right chest wall pain, no palpitations GI: no nausea, vomiting, abdominal pain, has diarrhea, no constipation GU: no dysuria, burning on urination, increased urinary frequency, hematuria  Ext: no leg edema Neuro: no unilateral weakness, numbness, or tingling, no vision change or hearing loss Skin: no rash, no skin tear. MSK: No muscle spasm, no deformity, no limitation of range of movement in spin.   Heme: No easy bruising.  Travel history: No recent long distant travel.   Allergy:  Allergies  Allergen Reactions   Contrast Media [Iodinated Contrast Media] Other (See Comments)    Kidney transplant   Iodine Other (See Comments)    Other reaction(s): Other (See Comments) Kidney transplant   Nsaids Other (See Comments)    Kidney transplant   Ibuprofen Other (See Comments)    Due to kidney transplant Due to kidney transplant Due to kidney transplant    Past Medical History:  Diagnosis Date   Anal condyloma    Anemia    Aortic atherosclerosis (HCC)    Aortic stenosis    a.) TTE 10/2021: Mod AS. AVA 1.06cm^2 (VTI). Mean grad ; b.) TTE 04/02/2022: no AV stenosis  Asthma, persistent controlled 02/25/2013   Attention or concentration deficit 04/26/2021   BPH with obstruction/lower urinary tract symptoms 09/25/2014   CAD (coronary artery disease) 2005   a.) LHC 1996 -> HG stenosis mLAD -> PTCA/PCI with stent x 1 (unk type) -> complicated by CFA pseudoanurysm (required surg);  b.) LHC 08/10/2002  -> 95% LAD, 70% ISR LAD, 80% pOM, 70%  mRCA --> CVTS consult; c.) 4v CABG 08/13/2002; c.) 03/2018 MV: Small, fixed inferoapical and apical sep defect. No isc. EF 47% (60-65% by 05/2018 TTE); d.) 02/2021 MV: small Apical inf fixed defect. No isc -> low risk.   Cardiomegaly    Cellulitis and abscess of left leg 07/22/2020   Chronic atrial fibrillation    a.) CHA2DS2VASc = 4 (CHF, HTN, vascular disease history, T2DM);  b.) rate/rhythm maintained without pharmacological intervention; chronically anticoagulated with apixaban    Chronic combined systolic and diastolic CHF (congestive heart failure)    a.) TTE 7/18 : EF 45-50%; b.) TTE 05/2018 : EF 60-65%, RVSP 74.8; c.) TTE 12/2018: EF 50-55%. Sev dil LA; d.) TTE 04/2019: EF 45-50%; e.) TTE 07/2021: EF 40-45%, G3DD; f.) TTE 10/2021: EF 40-45%, mild conc LVH, septal-lat dyssynchrony (LBBB), mildly red RVSF, mild-mod dil LA, mildly dil RA, mild-mod MS, mod AS; g.) TTE 04/02/2022: EF 40-45%, glob HK, LVH, red RVSF, RVE, sev LAE, mild-mod RAE, mild MR   Chronic venous stasis dermatitis of both lower extremities    Cytomegaloviral disease 2017   Diverticulosis of colon 04/22/2013   Dyspnea    Elevated RIGHT hemidiaphragm    Erectile dysfunction    a.) on topical TRT + Trimix (alprostadil/papaverine/phentolamine) injections   ESRD (end stage renal disease) (HCC)    a.) dialysis dependent 2004-2012; b.) s/p cadaveric RIGHT renal transplant 09/13/2010   Essential hypertension 12/17/2010   GERD (gastroesophageal reflux disease)    Gout    History of 2019 novel coronavirus disease (COVID-19) 06/30/2019   History of bilateral cataract extraction 10/2017   History of renal transplant 09/13/2010   a.) s/p cadaveric donor transplant 09/13/2010   Hx of bilateral cataract extraction 10/2017   Hyperlipidemia    Hypogonadism in male 09/25/2014   Ischemic cardiomyopathy    Left cephalic vein thrombosis 06/2019   Long term current use of anticoagulant    a.) apixaban    Long term current use of  immunosuppressive drug    a.) mycophenolate  + prednisone  + tacrolimus    Major depressive disorder 10/04/2013   Mitral stenosis 11/07/2021   a.) TTE 11/07/2021: severe MAC, mild-mod MS (MPG 6 mmHg); b.) TTE 04/02/2022: no MV stenosis   Murmur    Obesity hypoventilation syndrome 03/31/2012   OSA treated with BiPAP 09/29/2012   PAH (pulmonary artery hypertension)    a. 04/2019 RHC: RA 19, RV 80/20, PA 78/31 (51), PCWP 25, CO/CI 7.69/3.26. PVR 3.25 --> Sev PAH, likely primarily PV HTN; b.) RHC 04/04/2022: mRA 13, mPA 40, mPCWP 20, PA sat 59, AO sat 92, CO 7.63, CI 3.26, PVR 2.6 --> mod portal venous HTN   Pancreatitis    S/P CABG x 4 08/13/2002   a.) LIMA-LAD, LRA-OM, SVG-D1, SVG-dRCA   S/P gastric bypass    Synovitis of finger 06/11/2019   Trigger finger, unspecified little finger 12/23/2018   Type 2 diabetes mellitus with hyperglycemia, with long-term current use of insulin  09/09/2014   a.) has FreeStyle Libre CGM   Ulcer    Left shin goes to wound care center at [facility]   Wears glasses  Wears hearing aid in both ears     Past Surgical History:  Procedure Laterality Date   CARDIAC CATHETERIZATION N/A 08/10/2002   CATARACT EXTRACTION W/PHACO Right 10/29/2017   Procedure: CATARACT EXTRACTION PHACO AND INTRAOCULAR LENS PLACEMENT (IOC)  RIGHT DIABETIC;  Surgeon: Mittie Gaskin, MD;  Location: [facility];  Service: Ophthalmology;  Laterality: Right   CATARACT EXTRACTION W/PHACO Left 11/18/2017   Procedure: CATARACT EXTRACTION PHACO AND INTRAOCULAR LENS PLACEMENT (IOC) LEFT IVA/TOPICAL;  Surgeon: Mittie Gaskin, MD;  Location: [facility];  Service: Ophthalmology;  Laterality: Left   COLONOSCOPY WITH PROPOFOL  N/A 04/22/2013   Procedure: COLONOSCOPY WITH PROPOFOL ;  Surgeon: Toribio SHAUNNA Cedar, MD;  Location: [facility];  Service: Endoscopy;  Laterality: N/A;   CORONARY ANGIOPLASTY WITH STENT PLACEMENT N/A 1996   CORONARY ARTERY BYPASS GRAFT N/A 08/13/2002    Procedure: 4v CORONARY ARTERY BYPASS GRAFTING; Location: [facility]; Surgeon: Dessa Laine, MD   CYSTOSCOPY WITH INSERTION OF UROLIFT N/A 12/25/2021   Procedure: CYSTOSCOPY WITH INSERTION OF UROLIFT;  Surgeon: Twylla Glendia BROCKS, MD;  Location: [facility];  Service: Urology;  Laterality: N/A;   DG ANGIO AV SHUNT*L*     right and left upper arms   FASCIOTOMY  03/03/2012   Procedure: FASCIOTOMY;  Surgeon: Arley JONELLE Curia, MD;  Location: [facility];  Service: Orthopedics;  Laterality: Right;  FASCIOTOMY RIGHT SMALL FINGER   FASCIOTOMY Left 08/17/2013   Procedure: FASCIOTOMY LEFT RING;  Surgeon: Arley JONELLE Curia, MD;  Location:  SURGERY CENTER;  Service: Orthopedics;  Laterality: Left;   INCISION AND DRAINAGE ABSCESS Left 10/15/2015   Procedure: INCISION AND DRAINAGE ABSCESS;  Surgeon: Laneta JULIANNA Luna, MD;  Location: [facility];  Service: General;  Laterality: Left;   KIDNEY TRANSPLANT  09/13/2010   cadaver--at [facility]   LASER ABLATION CONDOLAMATA N/A 01/08/2023   Procedure: LASER REMOVAL ABLATION OF CONDYLOMATA;  Surgeon: Sheldon Standing, MD;  Location: [facility];  Service: General;  Laterality: N/A;  GEN AND LOCAL   RECTAL EXAM UNDER ANESTHESIA N/A 01/08/2023   Procedure: ANORECTAL EXAM UNDER ANESTHESIA;  Surgeon: Sheldon Standing, MD;  Location: [facility];  Service: General;  Laterality: N/A;   RIGHT HEART CATH N/A 11/15/2016   Procedure: RIGHT HEART CATH;  Surgeon: Darron Deatrice LABOR, MD;  Location: [facility];  Service: Cardiovascular;  Laterality: N/A;   RIGHT HEART CATH N/A 05/03/2019   Procedure: RIGHT HEART CATH;  Surgeon: [name], MD;  Location: [facility];  Service: Cardiovascular;  Laterality: N/A;   RIGHT HEART CATH N/A 04/04/2022   Procedure: RIGHT HEART CATH;  Surgeon: [name], MD;  Location: [facility];  Service: Cardiovascular;  Laterality: N/A;   ROUX-EN-Y GASTRIC BYPASS N/A 2015   TYMPANIC MEMBRANE REPAIR Left 03/2010   VASECTOMY      WART FULGURATION N/A 05/31/2022   Procedure: FULGURATION ANAL WART;  Surgeon: Tye Millet, DO;  Location: [facility];  Service: General;  Laterality: N/A;    Social History:  reports that he quit smoking about 28 years ago. His smoking use included cigars. He has never been exposed to tobacco smoke. He has never used smokeless tobacco. He reports that he does not currently use alcohol. He reports that he does not use drugs.  Family History:  Family History  Problem Relation Age of Onset   Heart disease Father    Kidney failure Father    Kidney disease Father    Diabetes Maternal Grandmother  Breast cancer Maternal Grandmother    Valvular heart disease Mother    Liver cancer Paternal Uncle    Liver cancer Paternal Grandmother    Prostate cancer Neg Hx      Prior to Admission medications   Medication Sig Start Date End Date Taking? Authorizing Provider  albuterol  (PROVENTIL ) (2.5 MG/3ML) 0.083% nebulizer solution Take 3 mLs (2.5 mg total) by nebulization every 6 (six) hours as needed for wheezing or shortness of breath. 11/18/22   [name], MD  albuterol  (VENTOLIN  HFA) 108 (90 Base) MCG/ACT inhaler Inhale 2 puffs into the lungs every 6 (six) hours as needed for wheezing or shortness of breath. 11/18/22   [name], MD  amoxicillin  (AMOXIL ) 500 MG capsule Take 4 capsules (2,000 mg total) by mouth as directed. Take one hour before dental appointment. 02/14/23     apixaban  (ELIQUIS ) 5 MG TABS tablet Take 1 tablet (5 mg total) by mouth 2 (two) times daily. 10/21/22 10/21/23  [name], MD  buPROPion  (WELLBUTRIN  SR) 150 MG 12 hr tablet Take 1 tablet (150 mg total) by mouth 2 (two) times daily. 06/17/22 06/17/23  [name], MD  cholecalciferol  (VITAMIN D3) 25 MCG (1000 UNIT) tablet Take 1,000 Units by mouth at bedtime.    [provider]  colchicine  0.6 MG tablet Take 1 tablet (0.6 mg total) by mouth every other day. 03/17/23     Continuous Glucose Sensor  (FREESTYLE LIBRE 3 SENSOR) MISC Use one every 2 weeks. 01/24/23     dapagliflozin  propanediol (FARXIGA ) 10 MG TABS tablet Take 1 tablet (10 mg total) by mouth daily before breakfast. 10/21/22   [name], MD  febuxostat  (ULORIC ) 40 MG tablet Take 3 tablets (120 mg total) by mouth daily. 10/15/22     fluticasone  (FLONASE ) 50 MCG/ACT nasal spray Place 2 sprays into both nostrils daily. 08/14/22   [name], MD  gabapentin  (NEURONTIN ) 300 MG capsule Take 1 capsule (300 mg total) by mouth 2 (two) times daily. 01/10/23   Damian Therisa HERO, MD  hydrocortisone  2.5 % cream Apply topically 3 (three) times daily as needed. 01/21/22   [name], MD  Insulin  Glargine (SEMGLEE  [place]) Inject 40 Units into the skin daily.    [provider]  insulin  lispro (HUMALOG  KWIKPEN) 100 UNIT/ML KwikPen Inject 10-20 Units into the skin 3 (three) times daily. Per sliding scale 05/31/22   Tye, Isami, DO  Insulin  Pen Needle (TECHLITE PEN NEEDLES) 31G X 5 MM MISC 4 (four) times daily. 08/21/22   Brendia Calton Squires, PA-C  Insulin  Pen Needle (TECHLITE PEN NEEDLES) 32G X 6 MM MISC Use 4 times a day 08/26/22     Insulin  Pen Needle (UNIFINE PENTIPS) 31G X 5 MM MISC Use 4 times daily as directed 05/28/21     lidocaine  (LIDODERM ) 5 % Place 1 patch onto the skin daily.PLACE 1 PATCH ONTO THE SKIN FOR 12 (TWELVE) HOURS. REMOVE & DISCARD PATCH WITHIN 12 HOURS OR AS DIRECTED BY MD 04/29/22 05/11/23  Jimmy Charlie I, MD  losartan  (COZAAR ) 25 MG tablet Take by mouth. 02/14/23   [provider]  montelukast  (SINGULAIR ) 10 MG tablet Take 1 tablet (10 mg total) by mouth at bedtime. 01/10/23 01/10/24  [name], MD  Multiple Vitamin (MULTIVITAMIN WITH MINERALS) TABS tablet Take 1 tablet by mouth daily.    [provider]  mycophenolate  (MYFORTIC ) 180 MG EC tablet Take 2 tablets (360 mg total) by mouth 2 (two) times daily.  04/04/22     omeprazole  (PRILOSEC) 20 MG capsule Take 1 capsule (20  mg total) by mouth daily. 01/09/23 01/09/24  [name], MD  potassium chloride  (MICRO-K ) 10 MEQ CR capsule Take by mouth. 02/14/23   [provider]  potassium chloride  SA (KLOR-CON  M) 20 MEQ tablet Take 3 tablets (60 mEq total) by mouth 2 (two) times daily. 03/12/23   [name], MD  predniSONE  (DELTASONE ) 5 MG tablet Take 1 tablet (5 mg total) by mouth daily. 03/12/23     predniSONE  (DELTASONE ) 5 MG tablet Take 4 tablets (20 mg total) by mouth once daily for 2 days, THEN 3 tablets (15 mg total) once daily for 2 days, THEN 2 tablets (10 mg total) once daily for 2 days, THEN 1 tablet (5 mg total) once daily for 2 days. 03/17/23     rosuvastatin  (CRESTOR ) 10 MG tablet Take 1 tablet (10 mg total) by mouth daily. 09/13/22   [name], MD  sulfamethoxazole -trimethoprim  (BACTRIM ) 400-80 MG tablet Take 1 tablet by mouth 3 (three) times a week. 02/14/23     tacrolimus  (PROGRAF ) 1 MG capsule Take 2 capsules (2 mg total) by mouth every morning AND 1 capsule (1 mg total) every evening. 03/12/23     tacrolimus  (PROGRAF ) 1 MG capsule Take 2 capsules (2 mg total) by mouth every morning AND 1 capsule (1 mg total) every evening. 03/12/23     tamsulosin  (FLOMAX ) 0.4 MG CAPS capsule Take 1 capsule (0.4 mg total) by mouth daily. 01/16/23   Vaillancourt, Samantha, PA-C  Testosterone  20.25 MG/ACT (1.62%) GEL Place 2 Pump onto the skin daily. 10/15/22   Helon Kirsch A, PA-C  tirzepatide  (MOUNJARO ) 5 MG/0.5ML Pen Inject 5 mg into the skin once a week. 03/25/23     torsemide  (DEMADEX ) 20 MG tablet Take 4 tablets (80 mg total) by mouth 2 (two) times daily. 02/19/23   [name], MD  traMADol  (ULTRAM ) 50 MG tablet Take 1 tablet (50 mg total) by mouth 3 (three) times daily as needed. 04/07/23   Letvak, Richard I, MD  zinc  sulfate 220 (50 Zn) MG capsule Take 220 mg by mouth daily.    [provider]    Physical Exam: Vitals:   04/08/23 2330 04/09/23 0000 04/09/23 0032 04/09/23  0033  BP: 117/67 114/83  114/66  Pulse: 73 79 72 70  Resp: (!) 28 (!) 35 (!) 31 (!) 24  Temp: 98.3 F (36.8 C)     TempSrc:      SpO2: (!) 81% 100% 100% 100%  Weight:      Height:       General: Not in acute distress HEENT:       Eyes: PERRL, EOMI, no jaundice       ENT: No discharge from the ears and nose, no pharynx injection, no tonsillar enlargement.        Neck: No JVD, no bruit, no mass felt. Heme: No neck lymph node enlargement. Cardiac: S1/S2, RRR, No murmurs, No gallops or rubs. Respiratory: No rales, wheezing, rhonchi or rubs. GI: Soft, nondistended, nontender, no rebound pain, no organomegaly, BS present. GU: No hematuria Ext: No pitting leg edema bilaterally.  Has chronic venous insufficiency changes both legs.  1+DP/PT pulse bilaterally. Musculoskeletal: No joint deformities, No joint redness or warmth, no limitation of ROM in spin.  Has right chest wall tenderness Skin: No rashes.  Neuro: Patient is lethargic and drowsy, easily arousable,  oriented X3, cranial nerves II-XII grossly intact,  moves all extremities normally. Psych: Patient is not psychotic, no suicidal or hemocidal ideation.  Labs on Admission: I have personally reviewed following labs and imaging studies  CBC: Recent Labs  Lab 04/08/23 1920  WBC 10.2  HGB 11.1*  HCT 35.4*  MCV 91.7  PLT 127*   Basic Metabolic Panel: Recent Labs  Lab 04/08/23 1920  NA 136  K 3.9  CL 100  CO2 21*  GLUCOSE 103*  BUN 83*  CREATININE 2.55*  CALCIUM  8.6*   GFR: Estimated Creatinine Clearance: 37 mL/min (A) (by C-G formula based on SCr of 2.55 mg/dL (H)). Liver Function Tests: No results for input(s): AST, ALT, ALKPHOS, BILITOT, PROT, ALBUMIN  in the last 168 hours. No results for input(s): LIPASE, AMYLASE in the last 168 hours. No results for input(s): AMMONIA in the last 168 hours. Coagulation Profile: No results for input(s): INR, PROTIME in the last 168 hours. Cardiac  Enzymes: No results for input(s): CKTOTAL, CKMB, CKMBINDEX, TROPONINI in the last 168 hours. BNP (last 3 results) Recent Labs    10/21/22 1214  PROBNP 4,402*   HbA1C: No results for input(s): HGBA1C in the last 72 hours. CBG: Recent Labs  Lab 04/09/23 0011  GLUCAP 102*   Lipid Profile: No results for input(s): CHOL, HDL, LDLCALC, TRIG, CHOLHDL, LDLDIRECT in the last 72 hours. Thyroid  Function Tests: No results for input(s): TSH, T4TOTAL, FREET4, T3FREE, THYROIDAB in the last 72 hours. Anemia Panel: No results for input(s): VITAMINB12, FOLATE, FERRITIN, TIBC, IRON , RETICCTPCT in the last 72 hours. Urine analysis:    Component Value Date/Time   COLORURINE RED (A) 06/21/2022 0848   APPEARANCEUR Clear 10/14/2022 0933   LABSPEC 1.014 06/21/2022 0848   PHURINE  06/21/2022 0848    TEST NOT REPORTED DUE TO COLOR INTERFERENCE OF URINE PIGMENT   GLUCOSEU Trace (A) 10/14/2022 0933   HGBUR (A) 06/21/2022 0848    TEST NOT REPORTED DUE TO COLOR INTERFERENCE OF URINE PIGMENT   BILIRUBINUR Negative 10/14/2022 0933   KETONESUR (A) 06/21/2022 0848    TEST NOT REPORTED DUE TO COLOR INTERFERENCE OF URINE PIGMENT   PROTEINUR Negative 10/14/2022 0933   PROTEINUR (A) 06/21/2022 0848    TEST NOT REPORTED DUE TO COLOR INTERFERENCE OF URINE PIGMENT   NITRITE Negative 10/14/2022 0933   NITRITE (A) 06/21/2022 0848    TEST NOT REPORTED DUE TO COLOR INTERFERENCE OF URINE PIGMENT   LEUKOCYTESUR Negative 10/14/2022 0933   LEUKOCYTESUR (A) 06/21/2022 0848    TEST NOT REPORTED DUE TO COLOR INTERFERENCE OF URINE PIGMENT   Sepsis Labs: @LABRCNTIP (procalcitonin:4,lacticidven:4) ) Recent Results (from the past 240 hours)  Resp panel by RT-PCR (RSV, Flu A&B, Covid) Anterior Nasal Swab     Status: Abnormal   Collection Time: 04/08/23  7:20 PM   Specimen: Anterior Nasal Swab  Result Value Ref Range Status   SARS Coronavirus 2 by RT PCR NEGATIVE NEGATIVE Final     Comment: (NOTE) SARS-CoV-2 target nucleic acids are NOT DETECTED.  The SARS-CoV-2 RNA is generally detectable in upper respiratory specimens during the acute phase of infection. The lowest concentration of SARS-CoV-2 viral copies this assay can detect is 138 copies/mL. A negative result does not preclude SARS-Cov-2 infection and should not be used as the sole basis for treatment or other patient management decisions. A negative result may occur with  improper specimen collection/handling, submission of specimen other than nasopharyngeal swab, presence of viral mutation(s) within the areas targeted by this assay, and inadequate number of viral copies(<138 copies/mL). A  negative result must be combined with clinical observations, patient history, and epidemiological information. The expected result is Negative.  Fact Sheet for Patients:  bloggercourse.com  Fact Sheet for Healthcare Providers:  seriousbroker.it  This test is no t yet approved or cleared by the [place]  FDA and  has been authorized for detection and/or diagnosis of SARS-CoV-2 by FDA under an Emergency Use Authorization (EUA). This EUA will remain  in effect (meaning this test can be used) for the duration of the COVID-19 declaration under Section 564(b)(1) of the Act, 21 U.S.C.section 360bbb-3(b)(1), unless the authorization is terminated  or revoked sooner.       Influenza A by PCR POSITIVE (A) NEGATIVE Final   Influenza B by PCR NEGATIVE NEGATIVE Final    Comment: (NOTE) The Xpert Xpress SARS-CoV-2/FLU/RSV plus assay is intended as an aid in the diagnosis of influenza from Nasopharyngeal swab specimens and should not be used as a sole basis for treatment. Nasal washings and aspirates are unacceptable for Xpert Xpress SARS-CoV-2/FLU/RSV testing.  Fact Sheet for Patients: bloggercourse.com  Fact Sheet for Healthcare  Providers: seriousbroker.it  This test is not yet approved or cleared by the [place]  FDA and has been authorized for detection and/or diagnosis of SARS-CoV-2 by FDA under an Emergency Use Authorization (EUA). This EUA will remain in effect (meaning this test can be used) for the duration of the COVID-19 declaration under Section 564(b)(1) of the Act, 21 U.S.C. section 360bbb-3(b)(1), unless the authorization is terminated or revoked.     Resp Syncytial Virus by PCR NEGATIVE NEGATIVE Final    Comment: (NOTE) Fact Sheet for Patients: bloggercourse.com  Fact Sheet for Healthcare Providers: seriousbroker.it  This test is not yet approved or cleared by the [place]  FDA and has been authorized for detection and/or diagnosis of SARS-CoV-2 by FDA under an Emergency Use Authorization (EUA). This EUA will remain in effect (meaning this test can be used) for the duration of the COVID-19 declaration under Section 564(b)(1) of the Act, 21 U.S.C. section 360bbb-3(b)(1), unless the authorization is terminated or revoked.  Performed at [facility], [address], [place], [place] [address]      Radiological Exams on Admission:   Assessment/Plan Principal Problem:   Influenza A Active Problems:   CAD s/p CABG 2005   Myocardial injury   Chronic HFrEF (heart failure with reduced ejection fraction) (HCC)   Hypertension   Hyperlipidemia   Renal transplant recipient, 2012   Chronic atrial fibrillation (HCC)   CKD (chronic kidney disease) stage 4, GFR 15-29 ml/min (HCC)   OSA (obstructive sleep apnea)   Right rib fracture   BPH (benign prostatic hyperplasia)   Thrombocytopenia (HCC)   Diarrhea   Type II diabetes mellitus with renal manifestations (HCC)   Obesity (BMI 30-39.9)   Assessment and Plan:  Influenza A: Patient has 2 L new oxygen  requirement. CT scan findings are concerning  for  left upper lobe pneumonia. Pt does not have leukocytosis, but the patient is immunosuppressed.  Will give antibiotics.  -Admitted to PCU as inpatient -Tamiflu  30 mg twice daily - Antibiotics: Rocephin  and azithromycin  - Solu-Medrol  80 mg daily after one dose of 125 mg - Mucinex  for cough  - Bronchodilators and Singulair  - Urine legionella and S. pneumococcal antigen - Follow up blood culture x2, sputum culture - will get Procalcitonin  CAD s/p CABG 2005 and Myocardial injury: trop  442 -switch Elqiuis to IV heparin  -Aspirin  81 mg daily -Crestor  -Trend troponin -Check A1c, FLP  Chronic HFrEF (heart failure with reduced ejection fraction) (HCC): 2D echo on 03/25/2023 showed EF of 45-50%.  Patient does not have leg edema.  Does not seem to have CHF exacerbation. -Continue home torsemide   Hypertension -IV hydralazine  as needed -Patient is on torsemide  -Amlodipine   Hyperlipidemia -Crestor   Renal transplant recipient, 2012 -Hold prednisone  since patient will be on IV Solu-Medrol  -Continue home tacrolimus  and mycophenolate   Chronic atrial fibrillation (HCC): Heart rate 30-60s -Switched Eliquis  to IV heparin  as above  CKD (chronic kidney disease) stage 4, GFR 15-29 ml/min (HCC): Recent baseline creatinine is in the large range, 1.8-3.8.  His creatinine is at 2.55, BUN 83, GFR 28, still in the normal range. -Follow-up with BMP  OSA -BiPAP  Right rib fracture -Incentive spirometry -As needed Percocet and Tylenol  -Lidoderm   BPH (benign prostatic hyperplasia) -Flomax   Thrombocytopenia (HCC): This is chronic issue.  Platelet 127 -Follow-up with CBC  Diarrhea -Check C. difficile and GI pathogen panel  Type II diabetes mellitus with renal manifestations [facility]): Recent A1c 8.0, patient is taking Farxiga , NovoLog , glargine insulin  40 units daily -Sliding scale insulin  -Glargine insulin  20 units daily  Obesity (BMI 30-39.9): Body weight 111.1 kg, BMI 35.15 -Encourage  losing weight -Has assessed healthy diet       DVT ppx: on IV Heparin     Code Status: Full code     Family Communication: not done, no family member is at bed side.      Disposition Plan:  Anticipate discharge back to previous environment  Consults called:  none  Admission status and Level of care: Progressive:   as inpt        Dispo: The patient is from: Home              Anticipated d/c is to: Home              Anticipated d/c date is: 2 days              Patient currently is not medically stable to d/c.    Severity of Illness:  The appropriate patient status for this patient is INPATIENT. Inpatient status is judged to be reasonable and necessary in order to provide the required intensity of service to ensure the patient's safety. The patient's presenting symptoms, physical exam findings, and initial radiographic and laboratory data in the context of their chronic comorbidities is felt to place them at high risk for further clinical deterioration. Furthermore, it is not anticipated that the patient will be medically stable for discharge from the hospital within 2 midnights of admission.   * I certify that at the point of admission it is my clinical judgment that the patient will require inpatient hospital care spanning beyond 2 midnights from the point of admission due to high intensity of service, high risk for further deterioration and high frequency of surveillance required.*       Date of Service 04/09/2023    Caleb Exon [facility]   If 7PM-7AM, please contact night-coverage [email or web address] 04/09/2023, 12:54 AM

## 2023-04-08 NOTE — ED Provider Notes (Signed)
 [facility] Provider Note    Event Date/Time   First MD Initiated Contact with Patient 04/08/23 2044     (approximate)   History   Weakness   HPI  [name]. is a 63 y.o. male with history of ESRD extensive past medical history presents to the ER for evaluation of generalized malaise fevers shortness of breath.  Reportedly was recently on a cruise had a fall with chest pain shoulder pain was told that he had rib fractures was given Toradol  and tramadol  told to rest.  Over the past 24 hours has had persistent fevers some increasing confusion shortness of breath.     Physical Exam   Triage Vital Signs: ED Triage Vitals  Encounter Vitals Group     BP 04/08/23 1918 (!) 128/112     Systolic BP Percentile --      Diastolic BP Percentile --      Pulse Rate 04/08/23 1918 67     Resp 04/08/23 1918 18     Temp 04/08/23 1918 98.5 F (36.9 C)     Temp Source 04/08/23 1918 Oral     SpO2 04/08/23 1918 94 %     Weight 04/08/23 1917 245 lb (111.1 kg)     Height 04/08/23 1917 5' 10 (1.778 m)     Head Circumference --      Peak Flow --      Pain Score 04/08/23 1916 6     Pain Loc --      Pain Education --      Exclude from Growth Chart --     Most recent vital signs: Vitals:   04/08/23 2100 04/08/23 2136  BP: 106/63 128/64  Pulse: (!) 31 67  Resp: (!) 23 (!) 21  Temp:    SpO2: (!) 82% 100%     Constitutional: Alert, ill appearing Eyes: Conjunctivae are normal.  Head: Atraumatic. Nose: No congestion/rhinnorhea. Mouth/Throat: Mucous membranes are moist.   Neck: Painless ROM.  Cardiovascular:   Good peripheral circulation. Respiratory: Normal respiratory effort.  No retractions.  Gastrointestinal: Soft and nontender.  Musculoskeletal:  no deformity Neurologic:  MAE spontaneously. No gross focal neurologic deficits are appreciated.  Skin:  Skin is warm, dry and intact. No rash noted. Psychiatric: Mood and affect are normal. Speech  and behavior are normal.    ED Results / Procedures / Treatments   Labs (all labs ordered are listed, but only abnormal results are displayed) Labs Reviewed  RESP PANEL BY RT-PCR (RSV, FLU A&B, COVID)  RVPGX2 - Abnormal; Notable for the following components:      Result Value   Influenza A by PCR POSITIVE (*)    All other components within normal limits  BASIC METABOLIC PANEL - Abnormal; Notable for the following components:   CO2 21 (*)    Glucose, Bld 103 (*)    BUN 83 (*)    Creatinine, Ser 2.55 (*)    Calcium  8.6 (*)    GFR, Estimated 28 (*)    All other components within normal limits  CBC - Abnormal; Notable for the following components:   RBC 3.86 (*)    Hemoglobin 11.1 (*)    HCT 35.4 (*)    RDW 15.7 (*)    Platelets 127 (*)    All other components within normal limits  BLOOD GAS, VENOUS - Abnormal; Notable for the following components:   pH, Ven 7.24 (*)    pCO2, Ven 65 (*)  All other components within normal limits  TROPONIN I (HIGH SENSITIVITY) - Abnormal; Notable for the following components:   Troponin I (High Sensitivity) 442 (*)    All other components within normal limits  CULTURE, BLOOD (ROUTINE X 2)  CULTURE, BLOOD (ROUTINE X 2)  URINALYSIS, ROUTINE W REFLEX MICROSCOPIC  LACTIC ACID, PLASMA  LACTIC ACID, PLASMA  TROPONIN I (HIGH SENSITIVITY)     EKG  ED ECG REPORT I, Belvie Essex, the attending physician, personally viewed and interpreted this ECG.   Date: 04/08/2023  EKG Time: 19:20  Rate: 70  Rhythm: afib  Axis: normal  Intervals: normal  ST&T Change: no stemi, nonspecific stab    RADIOLOGY Please see ED Course for my review and interpretation.  I personally reviewed all radiographic images ordered to evaluate for the above acute complaints and reviewed radiology reports and findings.  These findings were personally discussed with the patient.  Please see medical record for radiology report.    PROCEDURES:  Critical Care  performed: No  Procedures   MEDICATIONS ORDERED IN ED: Medications  cefTRIAXone  (ROCEPHIN ) 2 g in sodium chloride  0.9 % 100 mL IVPB (has no administration in time range)  azithromycin  (ZITHROMAX ) 500 mg in sodium chloride  0.9 % 250 mL IVPB (has no administration in time range)     IMPRESSION / MDM / ASSESSMENT AND PLAN / ED COURSE  I reviewed the triage vital signs and the nursing notes.                              Differential diagnosis includes, but is not limited to, Asthma, copd, CHF, pna, ptx, malignancy, Pe, anemia  Patient presenting to the ER for evaluation of symptoms as described above.  Based on symptoms, risk factors and considered above differential, this presenting complaint could reflect a potentially life-threatening illness therefore the patient will be placed on continuous pulse oximetry and telemetry for monitoring.  Laboratory evaluation will be sent to evaluate for the above complaints.      Clinical Course as of 04/08/23 2229  Tue Apr 08, 2023  2114 Patient's troponin is elevated patient also flu a positive this may be demand ischemia.  EKG without any clear evidence of ischemic changes but also concern for traumatic injury given recent fall.  CT imaging will be ordered to further evaluate. [PR]  2131 CT chest on pelvis on my review interpretation with findings concerning for some subacute rib fractures but I do not appreciate evidence of hemo or pneumothorax will await formal radiology report. [PR]  2209 Given multiple subacute rib fractures and CT imaging showing findings concerning for pneumonia in the setting of we will cover with antibiotics as well.  Will repeat troponin but I think this more likely demand ischemia in the setting of his hypoxia and acute infection.  Have given Tamiflu .  Pressure stable.  Will consult hospitalist for admission. [PR]  2225 Patient with borderline hypercapnia.  He is protecting his airway and mentating appropriate at this time.  [PR]    Clinical Course User Index [PR] Essex Belvie, MD     FINAL CLINICAL IMPRESSION(S) / ED DIAGNOSES   Final diagnoses:  Influenza A  Closed fracture of multiple ribs of right side, initial encounter  Pneumonia of right upper lobe due to infectious organism  Acute respiratory failure with hypoxia (HCC)     Rx / DC Orders   ED Discharge Orders     None  Note:  This document was prepared using Dragon voice recognition software and may include unintentional dictation errors.    Lang Dover, MD 04/08/23 2229

## 2023-04-09 DIAGNOSIS — J101 Influenza due to other identified influenza virus with other respiratory manifestations: Secondary | ICD-10-CM | POA: Diagnosis not present

## 2023-04-09 LAB — LIPID PANEL
Cholesterol: 62 mg/dL (ref 0–200)
HDL: 28 mg/dL — ABNORMAL LOW (ref >40)
LDL Cholesterol: 17 mg/dL (ref 0–99)
Total CHOL/HDL Ratio: 2.2 {ratio}
Triglycerides: 87 mg/dL (ref <150)
VLDL: 17 mg/dL (ref 0–40)

## 2023-04-09 LAB — BLOOD GAS, VENOUS
Acid-base deficit: 1.7 mmol/L (ref 0.0–2.0)
Bicarbonate: 26.2 mmol/L (ref 20.0–28.0)
O2 Saturation: 83.7 %
Patient temperature: 37
pCO2, Ven: 57 mm[Hg] (ref 44–60)
pH, Ven: 7.27 (ref 7.25–7.43)
pO2, Ven: 54 mm[Hg] — ABNORMAL HIGH (ref 32–45)

## 2023-04-09 LAB — URINALYSIS, ROUTINE W REFLEX MICROSCOPIC
Bilirubin Urine: NEGATIVE
Glucose, UA: NEGATIVE mg/dL
Hgb urine dipstick: NEGATIVE
Ketones, ur: NEGATIVE mg/dL
Leukocytes,Ua: NEGATIVE
Nitrite: NEGATIVE
Protein, ur: NEGATIVE mg/dL
Specific Gravity, Urine: 1.019 (ref 1.005–1.030)
pH: 5 (ref 5.0–8.0)

## 2023-04-09 LAB — TROPONIN I (HIGH SENSITIVITY)
Troponin I (High Sensitivity): 409 ng/L (ref <18)
Troponin I (High Sensitivity): 358 ng/L (ref <18)
Troponin I (High Sensitivity): 338 ng/L (ref <18)
Troponin I (High Sensitivity): 256 ng/L (ref <18)

## 2023-04-09 LAB — APTT
aPTT: 86 s — ABNORMAL HIGH (ref 24–36)
aPTT: 74 s — ABNORMAL HIGH (ref 24–36)

## 2023-04-09 LAB — HEPARIN LEVEL (UNFRACTIONATED): Heparin Unfractionated: 0.9 [IU]/mL — ABNORMAL HIGH (ref 0.30–0.70)

## 2023-04-09 LAB — BASIC METABOLIC PANEL
Anion gap: 12 (ref 5–15)
BUN: 93 mg/dL — ABNORMAL HIGH (ref 8–23)
CO2: 22 mmol/L (ref 22–32)
Calcium: 8.4 mg/dL — ABNORMAL LOW (ref 8.9–10.3)
Chloride: 104 mmol/L (ref 98–111)
Creatinine, Ser: 2.8 mg/dL — ABNORMAL HIGH (ref 0.61–1.24)
GFR, Estimated: 25 mL/min — ABNORMAL LOW (ref >60)
Glucose, Bld: 121 mg/dL — ABNORMAL HIGH (ref 70–99)
Potassium: 4.7 mmol/L (ref 3.5–5.1)
Sodium: 138 mmol/L (ref 135–145)

## 2023-04-09 LAB — CBC
HCT: 34.6 % — ABNORMAL LOW (ref 39.0–52.0)
Hemoglobin: 10.8 g/dL — ABNORMAL LOW (ref 13.0–17.0)
MCH: 28.2 pg (ref 26.0–34.0)
MCHC: 31.2 g/dL (ref 30.0–36.0)
MCV: 90.3 fL (ref 80.0–100.0)
Platelets: 123 10*3/uL — ABNORMAL LOW (ref 150–400)
RBC: 3.83 MIL/uL — ABNORMAL LOW (ref 4.22–5.81)
RDW: 15.8 % — ABNORMAL HIGH (ref 11.5–15.5)
WBC: 10.3 10*3/uL (ref 4.0–10.5)
nRBC: 0 % (ref 0.0–0.2)

## 2023-04-09 LAB — HEMOGLOBIN A1C
Hgb A1c MFr Bld: 7.7 % — ABNORMAL HIGH (ref 4.8–5.6)
Mean Plasma Glucose: 174.29 mg/dL

## 2023-04-09 LAB — EXPECTORATED SPUTUM ASSESSMENT W GRAM STAIN, RFLX TO RESP C

## 2023-04-09 LAB — PROCALCITONIN: Procalcitonin: 1.03 ng/mL

## 2023-04-09 LAB — STREP PNEUMONIAE URINARY ANTIGEN: Strep Pneumo Urinary Antigen: NEGATIVE

## 2023-04-09 LAB — CBG MONITORING, ED
Glucose-Capillary: 102 mg/dL — ABNORMAL HIGH (ref 70–99)
Glucose-Capillary: 125 mg/dL — ABNORMAL HIGH (ref 70–99)
Glucose-Capillary: 221 mg/dL — ABNORMAL HIGH (ref 70–99)
Glucose-Capillary: 244 mg/dL — ABNORMAL HIGH (ref 70–99)
Glucose-Capillary: 247 mg/dL — ABNORMAL HIGH (ref 70–99)

## 2023-04-09 LAB — BRAIN NATRIURETIC PEPTIDE: B Natriuretic Peptide: 1427.4 pg/mL — ABNORMAL HIGH (ref 0.0–100.0)

## 2023-04-09 MED ORDER — TACROLIMUS 1 MG PO CAPS
1.0000 mg | ORAL_CAPSULE | Freq: Every evening | ORAL | Status: DC
  Administered 2023-04-09 – 2023-04-12 (×4): 1 mg via ORAL
  Filled 2023-04-09 (×7): qty 1

## 2023-04-09 MED ORDER — AMLODIPINE BESYLATE 5 MG PO TABS
5.0000 mg | ORAL_TABLET | Freq: Every day | ORAL | Status: DC
Start: 2023-04-09 — End: 2023-04-13
  Administered 2023-04-10 – 2023-04-13 (×4): 5 mg via ORAL
  Filled 2023-04-09 (×4): qty 1

## 2023-04-09 MED ORDER — DAPAGLIFLOZIN PROPANEDIOL 10 MG PO TABS
10.0000 mg | ORAL_TABLET | Freq: Every day | ORAL | Status: DC
  Administered 2023-04-09 – 2023-04-13 (×5): 10 mg via ORAL
  Filled 2023-04-09 (×7): qty 1

## 2023-04-09 MED ORDER — FEBUXOSTAT 40 MG PO TABS
120.0000 mg | ORAL_TABLET | Freq: Every day | ORAL | Status: DC
  Administered 2023-04-09 – 2023-04-13 (×5): 120 mg via ORAL
  Filled 2023-04-09 (×7): qty 3

## 2023-04-09 MED ORDER — BUPROPION HCL ER (SR) 150 MG PO TB12
150.0000 mg | ORAL_TABLET | Freq: Two times a day (BID) | ORAL | Status: DC
  Administered 2023-04-09 – 2023-04-13 (×9): 150 mg via ORAL
  Filled 2023-04-09 (×11): qty 1

## 2023-04-09 MED ORDER — MYCOPHENOLATE SODIUM 180 MG PO TBEC
360.0000 mg | DELAYED_RELEASE_TABLET | Freq: Two times a day (BID) | ORAL | Status: DC
  Administered 2023-04-09 – 2023-04-13 (×9): 360 mg via ORAL
  Filled 2023-04-09 (×11): qty 2

## 2023-04-09 MED ORDER — GABAPENTIN 300 MG PO CAPS
300.0000 mg | ORAL_CAPSULE | Freq: Two times a day (BID) | ORAL | Status: DC
  Administered 2023-04-09 – 2023-04-13 (×9): 300 mg via ORAL
  Filled 2023-04-09 (×10): qty 1

## 2023-04-09 MED ORDER — COLCHICINE 0.6 MG PO TABS
0.6000 mg | ORAL_TABLET | ORAL | Status: DC
  Administered 2023-04-10 – 2023-04-12 (×2): 0.6 mg via ORAL
  Filled 2023-04-09 (×3): qty 1

## 2023-04-09 MED ORDER — MONTELUKAST SODIUM 10 MG PO TABS
10.0000 mg | ORAL_TABLET | Freq: Every day | ORAL | Status: DC
  Administered 2023-04-09 – 2023-04-12 (×4): 10 mg via ORAL
  Filled 2023-04-09 (×5): qty 1

## 2023-04-09 MED ORDER — ADULT MULTIVITAMIN W/MINERALS CH
1.0000 | ORAL_TABLET | Freq: Every day | ORAL | Status: DC
  Administered 2023-04-09 – 2023-04-13 (×5): 1 via ORAL
  Filled 2023-04-09 (×5): qty 1

## 2023-04-09 MED ORDER — TAMSULOSIN HCL 0.4 MG PO CAPS
0.4000 mg | ORAL_CAPSULE | Freq: Every day | ORAL | Status: DC
  Administered 2023-04-09 – 2023-04-13 (×5): 0.4 mg via ORAL
  Filled 2023-04-09 (×5): qty 1

## 2023-04-09 MED ORDER — INSULIN GLARGINE-YFGN 100 UNIT/ML ~~LOC~~ SOLN
20.0000 [IU] | Freq: Every day | SUBCUTANEOUS | Status: DC
Start: 2023-04-09 — End: 2023-04-10
  Administered 2023-04-09: 20 [IU] via SUBCUTANEOUS
  Filled 2023-04-09 (×2): qty 0.2

## 2023-04-09 MED ORDER — VITAMIN D 25 MCG (1000 UNIT) PO TABS
1000.0000 [IU] | ORAL_TABLET | Freq: Every day | ORAL | Status: DC
  Administered 2023-04-09 – 2023-04-12 (×4): 1000 [IU] via ORAL
  Filled 2023-04-09 (×4): qty 1

## 2023-04-09 MED ORDER — CEFTRIAXONE SODIUM 2 G IJ SOLR
2.0000 g | INTRAMUSCULAR | Status: DC
  Administered 2023-04-09 – 2023-04-10 (×2): 2 g via INTRAVENOUS
  Filled 2023-04-09 (×2): qty 20

## 2023-04-09 MED ORDER — LIDOCAINE 5 % EX PTCH
1.0000 | MEDICATED_PATCH | CUTANEOUS | Status: DC
  Filled 2023-04-09: qty 1

## 2023-04-09 MED ORDER — SODIUM CHLORIDE 0.9 % IV SOLN
500.0000 mg | INTRAVENOUS | Status: DC
Start: 2023-04-09 — End: 2023-04-11
  Administered 2023-04-09 – 2023-04-10 (×2): 500 mg via INTRAVENOUS
  Filled 2023-04-09 (×2): qty 5

## 2023-04-09 MED ORDER — ZINC SULFATE 220 (50 ZN) MG PO CAPS
220.0000 mg | ORAL_CAPSULE | Freq: Every day | ORAL | Status: DC
  Administered 2023-04-09 – 2023-04-13 (×5): 220 mg via ORAL
  Filled 2023-04-09 (×5): qty 1

## 2023-04-09 MED ORDER — TACROLIMUS 1 MG PO CAPS
2.0000 mg | ORAL_CAPSULE | Freq: Every morning | ORAL | Status: DC
  Administered 2023-04-09 – 2023-04-13 (×5): 2 mg via ORAL
  Filled 2023-04-09 (×6): qty 2

## 2023-04-09 MED ORDER — INSULIN GLARGINE 100 UNIT/ML ~~LOC~~ SOLN
20.0000 [IU] | Freq: Every day | SUBCUTANEOUS | Status: DC
  Filled 2023-04-09: qty 0.2

## 2023-04-09 MED ORDER — PANTOPRAZOLE SODIUM 40 MG PO TBEC
40.0000 mg | DELAYED_RELEASE_TABLET | Freq: Every day | ORAL | Status: DC
  Administered 2023-04-09 – 2023-04-13 (×5): 40 mg via ORAL
  Filled 2023-04-09 (×5): qty 1

## 2023-04-09 MED ORDER — TORSEMIDE 20 MG PO TABS
80.0000 mg | ORAL_TABLET | Freq: Two times a day (BID) | ORAL | Status: DC
Start: 2023-04-09 — End: 2023-04-10
  Administered 2023-04-09 (×2): 80 mg via ORAL
  Filled 2023-04-09 (×2): qty 4

## 2023-04-09 NOTE — ED Notes (Signed)
 Pt asked to take his mask of, RN allowed pt to remove mask at this time. Pt placed on 5 L [place], family at bedside, pt requesting something fort anxiety, hospitalist messaged

## 2023-04-09 NOTE — ED Notes (Signed)
 Pt family updated on plan of care, and hospitalist messaged to give pt wife an update. Pt resting in bed, denies needs at this time.

## 2023-04-09 NOTE — Progress Notes (Addendum)
 Progress Note    [name].  ZOX:[id number] DOB: 1960/06/29  DOA: 04/08/2023 PCP: Helaine Llanos, MD      Brief Narrative:    Medical records reviewed and are as summarized below:  [name]. is a 63 y.o. male  with medical history significant of s/p of renal transplant on immunosuppressant, CKD-4, HTN, HLD, DM, CAD, CBAG, CHF with with midrange EF 45-50%, gout, depression, ADHD, BPH, former smoker, OSA on BiPAP, obesity, left cephalic vein thrombus and A-fib on Eliquis , who presented to the hospital with shortness of breath.  He said he went on a cruise about a week prior to admission.  He fell while on the cruise and fractured 3 ribs on the right side.  He complained of cough productive of yellowish sputum and shortness of breath that started about 4 days prior to admission.  Also complained of watery stools and pain on the right side of the rib cage.  He tested positive for influenza A infection.  He was found to have bilateral pneumonia and acute hypoxemic and hypercapnic respiratory failure.     Assessment/Plan:   Principal Problem:   Influenza A Active Problems:   CAD s/p CABG 2005   Myocardial injury   Chronic HFrEF (heart failure with reduced ejection fraction) (HCC)   Hypertension   Hyperlipidemia   Renal transplant recipient, 2012   Chronic atrial fibrillation (HCC)   CKD (chronic kidney disease) stage 4, GFR 15-29 ml/min (HCC)   OSA (obstructive sleep apnea)   Right rib fracture   BPH (benign prostatic hyperplasia)   Thrombocytopenia (HCC)   Diarrhea   Type II diabetes mellitus with renal manifestations (HCC)   Obesity (BMI 30-39.9)   Body mass index is 35.15 kg/m.  (Obesity)   Influenza A infection, community-acquired pneumonia, probably underlying bacterial pneumonia as well: Continue Tamiflu , IV ceftriaxone  and azithromycin .  Follow-up blood and sputum cultures.   Acute hypoxemic and hypercapnic respiratory failure:  Improving.  He is off of BiPAP.  He is tolerating 5 L/min oxygen  via [place].  Wean off oxygen  as able.   Acute metabolic encephalopathy: Mental status is slowly improving.  Continue supportive care.   Acute diarrheal illness: Improving.  Probably from influenza A infection.  Check stool for C. difficile toxin and GI pathogen if diarrhea persist.   Elevated troponins: No chest pain.  This is likely from demand ischemia.  Discontinue IV heparin  drip later today or tomorrow morning. He was on Eliquis  prior to admission   Acute on chronic thrombocytopenia: Monitor platelet count.   Right 3rd through 5th rib fractures: Analgesics as needed for pain.  Incentive spirometry as needed.   Type II DM with hyperglycemia: Continue insulin  glargine 20 units daily.  He was taking 40 units at home.   CKD stage IV, history of renal transplant in 2012: Continue tacrolimus , mycophenolate  and steroids.   Comorbidities include CHF with midrange EF (45 to 50%), permanent atrial fibrillation on Eliquis , hypertension, hyperlipidemia, OSA on BiPAP, BPH   Diet Order             Diet heart healthy/carb modified Fluid consistency: Thin  Diet effective now                            Consultants: None  Procedures: None    Medications:    amLODipine   5 mg Oral Daily   aspirin  EC  81 mg Oral Daily  buPROPion   150 mg Oral BID   cholecalciferol   1,000 Units Oral QHS   [START ON 04/10/2023] colchicine   0.6 mg Oral QODAY   dapagliflozin  propanediol  10 mg Oral QAC breakfast   febuxostat   120 mg Oral Daily   gabapentin   300 mg Oral BID   insulin  aspart  0-5 Units Subcutaneous QHS   insulin  aspart  0-9 Units Subcutaneous TID WC   insulin  glargine-yfgn  20 Units Subcutaneous Q breakfast   lidocaine   1 patch Transdermal Q24H   methylPREDNISolone  (SOLU-MEDROL ) injection  80 mg Intravenous Q24H   montelukast   10 mg Oral QHS   multivitamin with minerals  1 tablet Oral Daily   mycophenolate    360 mg Oral BID   oseltamivir   30 mg Oral BID   pantoprazole   40 mg Oral Daily   tacrolimus   2 mg Oral q morning   And   tacrolimus   1 mg Oral QPM   tamsulosin   0.4 mg Oral Daily   torsemide   80 mg Oral BID   zinc  sulfate (50mg  elemental zinc )  220 mg Oral Daily   Continuous Infusions:  azithromycin      cefTRIAXone  (ROCEPHIN )  IV     heparin  1,350 Units/hr (04/09/23 0826)     Anti-infectives (From admission, onward)    Start     Dose/Rate Route Frequency Ordered Stop   04/09/23 2300  cefTRIAXone  (ROCEPHIN ) 2 g in sodium chloride  0.9 % 100 mL IVPB        2 g 200 mL/hr over 30 Minutes Intravenous Every 24 hours 04/09/23 0044     04/09/23 2200  azithromycin  (ZITHROMAX ) 500 mg in sodium chloride  0.9 % 250 mL IVPB        500 mg 250 mL/hr over 60 Minutes Intravenous Every 24 hours 04/09/23 0044     04/08/23 2245  oseltamivir  (TAMIFLU ) capsule 30 mg        30 mg Oral 2 times daily 04/08/23 2239 04/13/23 2159   04/08/23 2215  cefTRIAXone  (ROCEPHIN ) 2 g in sodium chloride  0.9 % 100 mL IVPB        2 g 200 mL/hr over 30 Minutes Intravenous Once 04/08/23 2208 04/08/23 2347   04/08/23 2215  azithromycin  (ZITHROMAX ) 500 mg in sodium chloride  0.9 % 250 mL IVPB        500 mg 250 mL/hr over 60 Minutes Intravenous  Once 04/08/23 2208 04/09/23 0055   04/08/23 2200  oseltamivir  (TAMIFLU ) capsule 30 mg  Status:  Discontinued        30 mg Oral  Once 04/08/23 2151 04/08/23 2221              Family Communication/Anticipated D/C date and plan/Code Status   DVT prophylaxis:      Code Status: Full Code  Family Communication: None Disposition Plan: Plan to discharge home   Status is: Inpatient Remains inpatient appropriate because: Pneumonia       Subjective:   Interval events noted.  He complains of general weakness, shortness of breath and cough.  He feels little better at present  Objective:    Vitals:   04/09/23 0920 04/09/23 1000 04/09/23 1030 04/09/23 1100  BP:  (!) 95/48 (!) 117/38 (!) 102/47 (!) 91/51  Pulse:   (!) 51 (!) 51  Resp:  (!) 21 19 16   Temp:      TempSrc:      SpO2:   97% 97%  Weight:      Height:  No data found.   Intake/Output Summary (Last 24 hours) at 04/09/2023 1141 Last data filed at 04/09/2023 5621 Gross per 24 hour  Intake 456.41 ml  Output 75 ml  Net 381.41 ml   Filed Weights   04/08/23 1917  Weight: 111.1 kg    Exam:  GEN: NAD SKIN: Warm and dry EYES: No pallor or icterus ENT: MMM CV: RRR PULM: Rales heard on left hemithorax ABD: soft, obese, NT, +BS CNS: AAO x 2 (person and place), non focal EXT: No edema or tenderness.  Chronic dark discoloration of bilateral legs        Data Reviewed:   I have personally reviewed following labs and imaging studies:  Labs: Labs show the following:   Basic Metabolic Panel: Recent Labs  Lab 04/08/23 1920 04/09/23 0343  NA 136 138  K 3.9 4.7  CL 100 104  CO2 21* 22  GLUCOSE 103* 121*  BUN 83* 93*  CREATININE 2.55* 2.80*  CALCIUM  8.6* 8.4*   GFR Estimated Creatinine Clearance: 33.7 mL/min (A) (by C-G formula based on SCr of 2.8 mg/dL (H)). Liver Function Tests: No results for input(s): "AST", "ALT", "ALKPHOS", "BILITOT", "PROT", "ALBUMIN " in the last 168 hours. No results for input(s): "LIPASE", "AMYLASE" in the last 168 hours. No results for input(s): "AMMONIA" in the last 168 hours. Coagulation profile No results for input(s): "INR", "PROTIME" in the last 168 hours.  CBC: Recent Labs  Lab 04/08/23 1920 04/09/23 0343  WBC 10.2 10.3  HGB 11.1* 10.8*  HCT 35.4* 34.6*  MCV 91.7 90.3  PLT 127* 123*   Cardiac Enzymes: No results for input(s): "CKTOTAL", "CKMB", "CKMBINDEX", "TROPONINI" in the last 168 hours. BNP (last 3 results) Recent Labs    10/21/22 1214  PROBNP 4,402*   CBG: Recent Labs  Lab 04/09/23 0011 04/09/23 0728 04/09/23 1123  GLUCAP 102* 125* 244*   D-Dimer: No results for input(s): "DDIMER" in the last 72  hours. Hgb A1c: Recent Labs    04/09/23 0343  HGBA1C 7.7*   Lipid Profile: Recent Labs    04/09/23 0343  CHOL 62  HDL 28*  LDLCALC 17  TRIG 87  CHOLHDL 2.2   Thyroid  function studies: No results for input(s): "TSH", "T4TOTAL", "T3FREE", "THYROIDAB" in the last 72 hours.  Invalid input(s): "FREET3" Anemia work up: No results for input(s): "VITAMINB12", "FOLATE", "FERRITIN", "TIBC", "IRON ", "RETICCTPCT" in the last 72 hours. Sepsis Labs: Recent Labs  Lab 04/08/23 1920 04/08/23 2148 04/08/23 2247 04/09/23 0343  PROCALCITON  --  1.03  --   --   WBC 10.2  --   --  10.3  LATICACIDVEN  --   --  0.9  --     Microbiology Recent Results (from the past 240 hours)  Resp panel by RT-PCR (RSV, Flu A&B, Covid) Anterior Nasal Swab     Status: Abnormal   Collection Time: 04/08/23  7:20 PM   Specimen: Anterior Nasal Swab  Result Value Ref Range Status   SARS Coronavirus 2 by RT PCR NEGATIVE NEGATIVE Final    Comment: (NOTE) SARS-CoV-2 target nucleic acids are NOT DETECTED.  The SARS-CoV-2 RNA is generally detectable in upper respiratory specimens during the acute phase of infection. The lowest concentration of SARS-CoV-2 viral copies this assay can detect is 138 copies/mL. A negative result does not preclude SARS-Cov-2 infection and should not be used as the sole basis for treatment or other patient management decisions. A negative result may occur with  improper specimen collection/handling, submission of specimen  other than nasopharyngeal swab, presence of viral mutation(s) within the areas targeted by this assay, and inadequate number of viral copies(<138 copies/mL). A negative result must be combined with clinical observations, patient history, and epidemiological information. The expected result is Negative.  Fact Sheet for Patients:  BloggerCourse.com  Fact Sheet for Healthcare Providers:  SeriousBroker.it  This test  is no t yet approved or cleared by the [place]  FDA and  has been authorized for detection and/or diagnosis of SARS-CoV-2 by FDA under an Emergency Use Authorization (EUA). This EUA will remain  in effect (meaning this test can be used) for the duration of the COVID-19 declaration under Section 564(b)(1) of the Act, 21 U.S.C.section 360bbb-3(b)(1), unless the authorization is terminated  or revoked sooner.       Influenza A by PCR POSITIVE (A) NEGATIVE Final   Influenza B by PCR NEGATIVE NEGATIVE Final    Comment: (NOTE) The Xpert Xpress SARS-CoV-2/FLU/RSV plus assay is intended as an aid in the diagnosis of influenza from Nasopharyngeal swab specimens and should not be used as a sole basis for treatment. Nasal washings and aspirates are unacceptable for Xpert Xpress SARS-CoV-2/FLU/RSV testing.  Fact Sheet for Patients: BloggerCourse.com  Fact Sheet for Healthcare Providers: SeriousBroker.it  This test is not yet approved or cleared by the [place]  FDA and has been authorized for detection and/or diagnosis of SARS-CoV-2 by FDA under an Emergency Use Authorization (EUA). This EUA will remain in effect (meaning this test can be used) for the duration of the COVID-19 declaration under Section 564(b)(1) of the Act, 21 U.S.C. section 360bbb-3(b)(1), unless the authorization is terminated or revoked.     Resp Syncytial Virus by PCR NEGATIVE NEGATIVE Final    Comment: (NOTE) Fact Sheet for Patients: BloggerCourse.com  Fact Sheet for Healthcare Providers: SeriousBroker.it  This test is not yet approved or cleared by the [place]  FDA and has been authorized for detection and/or diagnosis of SARS-CoV-2 by FDA under an Emergency Use Authorization (EUA). This EUA will remain in effect (meaning this test can be used) for the duration of the COVID-19 declaration under  Section 564(b)(1) of the Act, 21 U.S.C. section 360bbb-3(b)(1), unless the authorization is terminated or revoked.  Performed at [facility], [address] [place], [place] [address]     Procedures and diagnostic studies:  CT CHEST ABDOMEN PELVIS WO CONTRAST Result Date: 04/08/2023 CLINICAL DATA:  Fall, right rib fractures, shortness of breath, cough EXAM: CT CHEST, ABDOMEN AND PELVIS WITHOUT CONTRAST TECHNIQUE: Multidetector CT imaging of the chest, abdomen and pelvis was performed following the standard protocol without IV contrast. RADIATION DOSE REDUCTION: This exam was performed according to the departmental dose-optimization program which includes automated exposure control, adjustment of the mA and/or kV according to patient size and/or use of iterative reconstruction technique. COMPARISON:  Chest radiograph dated 04/08/2023. CT chest abdomen pelvis dated 06/21/2022. FINDINGS: CT CHEST FINDINGS Cardiovascular: Mild cardiomegaly.  No pericardial effusion. No evidence of thoracic aortic aneurysm. Atherosclerotic calcifications of the aortic arch. Severe three-vessel coronary atherosclerosis. Mediastinum/Nodes: Calcified mediastinal nodes, benign. Visualized thyroid  is unremarkable. Lungs/Pleura: Patchy/nodular left upper lobe opacities, suggesting pneumonia. Evaluation lung parenchyma is constrained by respiratory motion. Within that constraint, there are no suspicious pulmonary nodules. No pleural effusion or pneumothorax. Musculoskeletal: Degenerative changes of the mid/lower thoracic spine. Median sternotomy. Nondisplaced right posterior 3rd through 5th rib fractures. CT ABDOMEN PELVIS FINDINGS Hepatobiliary: Unenhanced liver is grossly unremarkable. Status post cholecystectomy. No intrahepatic or extrahepatic dilatation but Pancreas: Within normal limits.  Spleen: At the mL limits of normal for size. Adrenals/Urinary Tract: Adrenal glands are within normal limits. Bilateral native  renal atrophy with bilateral renal vascular calcifications. No hydronephrosis. Right lower quadrant renal transplant, without hydronephrosis. Mildly thick-walled bladder with perivesical stranding (series 2/image 117). Stomach/Bowel: Postsurgical changes related to partial gastrectomy. No evidence of bowel obstruction. Appendix is not discretely visualized. No colonic wall thickening or inflammatory changes. Vascular/Lymphatic: No evidence of abdominal aortic aneurysm. Atherosclerotic calcifications of the abdominal aorta and branch vessels. No suspicious abdominopelvic lymphadenopathy. Reproductive: Prostate is notable for postsurgical changes related to prior urolift procedure. Other: No abdominopelvic ascites. Mild postsurgical changes along the right anterior abdominal wall. Musculoskeletal: Mild degenerative changes of the lumbar spine. No fracture is seen. IMPRESSION: Nondisplaced right posterior 3rd through 5th rib fractures. No pneumothorax. Suspected left upper lobe pneumonia. Mildly thick-walled bladder with perivesical stranding, correlate for cystitis. Additional ancillary findings as above. Aortic Atherosclerosis (ICD10-I70.0). Electronically Signed   By: Zadie Herter M.D.   On: 04/08/2023 21:59   CT HEAD WO CONTRAST ( ) Result Date: 04/08/2023 CLINICAL DATA:  Head trauma, abnormal mental status (Age 44-64y); Ataxia, cervical trauma EXAM: CT HEAD WITHOUT CONTRAST CT CERVICAL SPINE WITHOUT CONTRAST TECHNIQUE: Multidetector CT imaging of the head and cervical spine was performed following the standard protocol without intravenous contrast. Multiplanar CT image reconstructions of the cervical spine were also generated. RADIATION DOSE REDUCTION: This exam was performed according to the departmental dose-optimization program which includes automated exposure control, adjustment of the mA and/or kV according to patient size and/or use of iterative reconstruction technique. COMPARISON:  None  Available. FINDINGS: CT HEAD FINDINGS Brain: No hemorrhage. No hydrocephalus. No extra-axial fluid collection. No mass effect. No mass lesion. No CT evidence of an acute cortical infarct. Vascular: No hyperdense vessel or unexpected calcification. Skull: Normal. Negative for fracture or focal lesion. Sinuses/Orbits: No middle ear or mastoid effusion. Paranasal sinuses are notable for mucosal thickening in the left ethmoid sinus. Bilateral lens replacement. Orbits are otherwise unremarkable. Other: None. CT CERVICAL SPINE FINDINGS Alignment: Normal. Skull base and vertebrae: No acute fracture. No primary bone lesion or focal pathologic process. Soft tissues and spinal canal: No prevertebral fluid or swelling. No visible canal hematoma. Disc levels: Calcified disc bulge C3-C4 results in mild-to-moderate spinal canal narrowing. Upper chest: Negative. Other: None IMPRESSION: 1. No acute intracranial abnormality. 2. No acute fracture or traumatic subluxation of the cervical spine. 3. Calcified disc bulge C3-C4 results in mild-to-moderate spinal canal narrowing. Electronically Signed   By: Clora Dane M.D.   On: 04/08/2023 21:42   CT Cervical Spine Wo Contrast Result Date: 04/08/2023 CLINICAL DATA:  Head trauma, abnormal mental status (Age 45-64y); Ataxia, cervical trauma EXAM: CT HEAD WITHOUT CONTRAST CT CERVICAL SPINE WITHOUT CONTRAST TECHNIQUE: Multidetector CT imaging of the head and cervical spine was performed following the standard protocol without intravenous contrast. Multiplanar CT image reconstructions of the cervical spine were also generated. RADIATION DOSE REDUCTION: This exam was performed according to the departmental dose-optimization program which includes automated exposure control, adjustment of the mA and/or kV according to patient size and/or use of iterative reconstruction technique. COMPARISON:  None Available. FINDINGS: CT HEAD FINDINGS Brain: No hemorrhage. No hydrocephalus. No extra-axial  fluid collection. No mass effect. No mass lesion. No CT evidence of an acute cortical infarct. Vascular: No hyperdense vessel or unexpected calcification. Skull: Normal. Negative for fracture or focal lesion. Sinuses/Orbits: No middle ear or mastoid effusion. Paranasal sinuses are notable for mucosal thickening in the left  ethmoid sinus. Bilateral lens replacement. Orbits are otherwise unremarkable. Other: None. CT CERVICAL SPINE FINDINGS Alignment: Normal. Skull base and vertebrae: No acute fracture. No primary bone lesion or focal pathologic process. Soft tissues and spinal canal: No prevertebral fluid or swelling. No visible canal hematoma. Disc levels: Calcified disc bulge C3-C4 results in mild-to-moderate spinal canal narrowing. Upper chest: Negative. Other: None IMPRESSION: 1. No acute intracranial abnormality. 2. No acute fracture or traumatic subluxation of the cervical spine. 3. Calcified disc bulge C3-C4 results in mild-to-moderate spinal canal narrowing. Electronically Signed   By: Clora Dane M.D.   On: 04/08/2023 21:42   DG Chest 2 View Result Date: 04/08/2023 CLINICAL DATA:  Cough EXAM: CHEST - 2 VIEW COMPARISON:  04/07/2023, CT chest 07/07/2022 FINDINGS: Post sternotomy changes. Rounded right paratracheal calcification consistent with node is unchanged. Cardiomegaly with central congestion. No pleural effusion or pneumothorax. Streaky bilateral perihilar opacities. IMPRESSION: Cardiomegaly with central congestion. Streaky bilateral perihilar opacities question atypical infection Electronically Signed   By: Esmeralda Hedge M.D.   On: 04/08/2023 20:38   DG Chest 2 View Result Date: 04/07/2023 CLINICAL DATA:  Rib fractures, pneumothorax EXAM: CHEST - 2 VIEW COMPARISON:  11/01/2022, CT 07/07/2022 FINDINGS: Cardiomegaly, prior CABG. Calcified right paratracheal lymph node again noted as seen on prior CT. No confluent airspace opacities, effusions or edema. No acute bony abnormality. IMPRESSION:  Cardiomegaly.  No active disease. Electronically Signed   By: Janeece Mechanic M.D.   On: 04/07/2023 16:48               LOS: 1 day   Tiny Chaudhary  [facility]   Pager on [email or web address]. If 7PM-7AM, please contact night-coverage at [email or web address]     04/09/2023, 11:41 AM

## 2023-04-09 NOTE — ED Notes (Signed)
 Pt repositioned in bed.

## 2023-04-09 NOTE — ED Notes (Signed)
 Pt repositioned in bed, given fresh water , a fan, and the TV remote, pt denies other needs at this time

## 2023-04-09 NOTE — Progress Notes (Signed)
 PHARMACY - ANTICOAGULATION CONSULT NOTE  Pharmacy Consult for Heparin   Indication: chest pain/ACS  Allergies  Allergen Reactions   Contrast Media [Iodinated Contrast Media] Other (See Comments)    Kidney transplant   Iodine Other (See Comments)    Other reaction(s): Other (See Comments) Kidney transplant   Nsaids Other (See Comments)    Kidney transplant   Ibuprofen Other (See Comments)    Due to kidney transplant Due to kidney transplant Due to kidney transplant    Patient Measurements: Height: 5\' 10"  (177.8 cm) Weight: 111.1 kg (245 lb) IBW/kg (Calculated) : 73 Heparin  Dosing Weight:  97.2 kg   Vital Signs: Temp: 99.3 F (37.4 C) (01/15 0223) Temp Source: Oral (01/15 0223) BP: 91/51 (01/15 1100) Pulse Rate: 51 (01/15 1100)  Labs: Recent Labs    04/08/23 1920 04/08/23 2148 04/09/23 0343 04/09/23 0544 04/09/23 0800 04/09/23 1137  HGB 11.1*  --  10.8*  --   --   --   HCT 35.4*  --  34.6*  --   --   --   PLT 127*  --  123*  --   --   --   APTT  --   --   --  86*  --  74*  HEPARINUNFRC  --   --   --  0.90*  --   --   CREATININE 2.55*  --  2.80*  --   --   --   TROPONINIHS 442*   < > 409* 358* 338* 256*   < > = values in this interval not displayed.    Estimated Creatinine Clearance: 33.7 mL/min (A) (by C-G formula based on SCr of 2.8 mg/dL (H)).   Medical History: Past Medical History:  Diagnosis Date   Anal condyloma    Anemia    Aortic atherosclerosis (HCC)    Aortic stenosis    a.) TTE 10/2021: Mod AS. AVA 1.06cm^2 (VTI). Mean grad ; b.) TTE 04/02/2022: no AV stenosis   Asthma, persistent controlled 02/25/2013   Attention or concentration deficit 04/26/2021   BPH with obstruction/lower urinary tract symptoms 09/25/2014   CAD (coronary artery disease) 2005   a.) LHC 1996 -> HG stenosis mLAD -> PTCA/PCI with stent x 1 (unk type) -> complicated by CFA pseudoanurysm (required surg);  b.) LHC 08/10/2002  -> 95% LAD, 70% ISR LAD, 80% pOM, 70% mRCA -->  CVTS consult; c.) 4v CABG 08/13/2002; c.) 03/2018 MV: Small, fixed inferoapical and apical sep defect. No isc. EF 47% (60-65% by 05/2018 TTE); d.) 02/2021 MV: small Apical inf fixed defect. No isc -> low risk.   Cardiomegaly    Cellulitis and abscess of left leg 07/22/2020   Chronic atrial fibrillation    a.) CHA2DS2VASc = 4 (CHF, HTN, vascular disease history, T2DM);  b.) rate/rhythm maintained without pharmacological intervention; chronically anticoagulated with apixaban    Chronic combined systolic and diastolic CHF (congestive heart failure)    a.) TTE 7/18 : EF 45-50%; b.) TTE 05/2018 : EF 60-65%, RVSP 74.8; c.) TTE 12/2018: EF 50-55%. Sev dil LA; d.) TTE 04/2019: EF 45-50%; e.) TTE 07/2021: EF 40-45%, G3DD; f.) TTE 10/2021: EF 40-45%, mild conc LVH, septal-lat dyssynchrony (LBBB), mildly red RVSF, mild-mod dil LA, mildly dil RA, mild-mod MS, mod AS; g.) TTE 04/02/2022: EF 40-45%, glob HK, LVH, red RVSF, RVE, sev LAE, mild-mod RAE, mild MR   Chronic venous stasis dermatitis of both lower extremities    Cytomegaloviral disease 2017   Diverticulosis of colon 04/22/2013  Dyspnea    Elevated RIGHT hemidiaphragm    Erectile dysfunction    a.) on topical TRT + Trimix (alprostadil/papaverine/phentolamine) injections   ESRD (end stage renal disease) (HCC)    a.) dialysis dependent 2004-2012; b.) s/p cadaveric RIGHT renal transplant 09/13/2010   Essential hypertension 12/17/2010   GERD (gastroesophageal reflux disease)    Gout    History of 2019 novel coronavirus disease (COVID-19) 06/30/2019   History of bilateral cataract extraction 10/2017   History of renal transplant 09/13/2010   a.) s/p cadaveric donor transplant 09/13/2010   Hx of bilateral cataract extraction 10/2017   Hyperlipidemia    Hypogonadism in male 09/25/2014   Ischemic cardiomyopathy    Left cephalic vein thrombosis 06/2019   Long term current use of anticoagulant    a.) apixaban    Long term current use of immunosuppressive drug     a.) mycophenolate  + prednisone  + tacrolimus    Major depressive disorder 10/04/2013   Mitral stenosis 11/07/2021   a.) TTE 11/07/2021: severe MAC, mild-mod MS (MPG 6 mmHg); b.) TTE 04/02/2022: no MV stenosis   Murmur    Obesity hypoventilation syndrome 03/31/2012   OSA treated with BiPAP 09/29/2012   PAH (pulmonary artery hypertension)    a. 04/2019 RHC: RA 19, RV 80/20, PA 78/31 (51), PCWP 25, CO/CI 7.69/3.26. PVR 3.25 --> Sev PAH, likely primarily PV HTN; b.) RHC 04/04/2022: mRA 13, mPA 40, mPCWP 20, PA sat 59, AO sat 92, CO 7.63, CI 3.26, PVR 2.6 --> mod portal venous HTN   Pancreatitis    S/P CABG x 4 08/13/2002   a.) LIMA-LAD, LRA-OM, SVG-D1, SVG-dRCA   S/P gastric bypass    Synovitis of finger 06/11/2019   Trigger finger, unspecified little finger 12/23/2018   Type 2 diabetes mellitus with hyperglycemia, with long-term current use of insulin  09/09/2014   a.) has FreeStyle Libre CGM   Ulcer    Left shin goes to wound care center at [facility]   Wears glasses    Wears hearing aid in both ears     Medications:  (Not in a hospital admission)   Assessment: Pharmacy consulted to dose heparin  in this 63 year old male admitted with Flu,  ACS/NSTEMI.  Pt was on Eliquis  5 mg PO  BID for Afib.  Last dose on 1/14 AM.  CrCl = 37 ml/min   1/15 @ 0544:  aPTT = 86,   HL = 0.90 1/15 @ 1137:  aPTT = 74  Goal of Therapy:  Heparin  level 0.3-0.7 units/ml aPTT 66 - 102  seconds Monitor platelets by anticoagulation protocol: Yes   Plan:  1/15 @ 1137:  aPTT = 74 - aPTT therapeutic X 2,  HL elevated from Eliquis  PTA - will continue pt on current rate and recheck aPTT in the morning given therapeutic x 2 - will recheck HL on 1/16 with AM labs  - use aPTT to guide dosing until correlating with HL - will check CBC daily   Alice Innocent, PharmD Clinical Pharmacist 04/09/2023,12:50 PM

## 2023-04-09 NOTE — ED Notes (Signed)
 This RN called pt wife per her request and gave pt the phone. Pt given blanket and repositioned denies other needs at this time

## 2023-04-09 NOTE — ED Notes (Signed)
 Pt  c/o feeling short of breath and put the bipap mask back on. This nurse adjusted mask because it was leaking. Provider notified.

## 2023-04-09 NOTE — ED Notes (Signed)
Pt given another pillow. 

## 2023-04-09 NOTE — ED Notes (Addendum)
 Pt resting in bed, pt alert to voice denies any needs at this time. Updated that doctor will be in soon to see patient.

## 2023-04-09 NOTE — ED Notes (Signed)
 Patient removed his bipap and was not wanting to put it back on. This nurse educated pt that he needed the Bipap to help his breathing. Pt still refused. Pt was placed on a [name] at 3 lit, saturation was 100%. RR of 25. Respiratory and provider notified.

## 2023-04-09 NOTE — ED Notes (Signed)
 Pt took off his mask complaining that he was hot.  Hunter, RN explained to patient that he needs the bipap to help him breathe. A fan was provided for the patient.  Pt's temp is 99.3 and tylenol  given before pt was placed back on bipap.  Pt's thermostat also adjusted to cool off the room.

## 2023-04-09 NOTE — ED Notes (Signed)
 Attempted to give pt his oral medications. He is very drowsy and unable to stay awake during medication explanation. Medications returned and Dr. Rosalea Collin notified. Pt not able to safely swallow pills at this time.  Pt's respiration rate has been steadily increasing since being in ER, Pt's respirations are 35-40.  Dr Rosalea Collin notified. A VBG was ordered and pt placed on Bipap.

## 2023-04-09 NOTE — Progress Notes (Signed)
   04/09/23 1205  Readmission Prevention Plan - Extreme Risk  Transportation Screening Complete  Medication Review Complete  PCP or Specialist appointment within 3-5 days of discharge Complete (Nurse Secretary will schedule at time of d/c)  Social Work consult for recovery care planning/counseling (includes patient and caregiver) Complete  Palliative Care Screening Not Applicable  Skilled Nursing Facility Not Applicable   CSW completed High Risk Readmission assessment w/ Pt. No further TOC needs at this time. If any TOC needs arise, please place [facility] consult.

## 2023-04-09 NOTE — Progress Notes (Signed)
 PHARMACY - ANTICOAGULATION CONSULT NOTE  Pharmacy Consult for Heparin   Indication: chest pain/ACS  Allergies  Allergen Reactions   Contrast Media [Iodinated Contrast Media] Other (See Comments)    Kidney transplant   Iodine Other (See Comments)    Other reaction(s): Other (See Comments) Kidney transplant   Nsaids Other (See Comments)    Kidney transplant   Ibuprofen Other (See Comments)    Due to kidney transplant Due to kidney transplant Due to kidney transplant    Patient Measurements: Height: 5\' 10"  (177.8 cm) Weight: 111.1 kg (245 lb) IBW/kg (Calculated) : 73 Heparin  Dosing Weight:  97.2 kg   Vital Signs: Temp: 99.3 F (37.4 C) (01/15 0223) Temp Source: Oral (01/15 0223) BP: 123/52 (01/15 0530) Pulse Rate: 60 (01/15 0605)  Labs: Recent Labs    04/08/23 1920 04/08/23 2148 04/09/23 0343 04/09/23 0544  HGB 11.1*  --  10.8*  --   HCT 35.4*  --  34.6*  --   PLT 127*  --  123*  --   APTT  --   --   --  86*  HEPARINUNFRC  --   --   --  0.90*  CREATININE 2.55*  --  2.80*  --   TROPONINIHS 442* 465* 409*  --     Estimated Creatinine Clearance: 33.7 mL/min (A) (by C-G formula based on SCr of 2.8 mg/dL (H)).   Medical History: Past Medical History:  Diagnosis Date   Anal condyloma    Anemia    Aortic atherosclerosis (HCC)    Aortic stenosis    a.) TTE 10/2021: Mod AS. AVA 1.06cm^2 (VTI). Mean grad ; b.) TTE 04/02/2022: no AV stenosis   Asthma, persistent controlled 02/25/2013   Attention or concentration deficit 04/26/2021   BPH with obstruction/lower urinary tract symptoms 09/25/2014   CAD (coronary artery disease) 2005   a.) LHC 1996 -> HG stenosis mLAD -> PTCA/PCI with stent x 1 (unk type) -> complicated by CFA pseudoanurysm (required surg);  b.) LHC 08/10/2002  -> 95% LAD, 70% ISR LAD, 80% pOM, 70% mRCA --> CVTS consult; c.) 4v CABG 08/13/2002; c.) 03/2018 MV: Small, fixed inferoapical and apical sep defect. No isc. EF 47% (60-65% by 05/2018 TTE); d.)  02/2021 MV: small Apical inf fixed defect. No isc -> low risk.   Cardiomegaly    Cellulitis and abscess of left leg 07/22/2020   Chronic atrial fibrillation    a.) CHA2DS2VASc = 4 (CHF, HTN, vascular disease history, T2DM);  b.) rate/rhythm maintained without pharmacological intervention; chronically anticoagulated with apixaban    Chronic combined systolic and diastolic CHF (congestive heart failure)    a.) TTE 7/18 : EF 45-50%; b.) TTE 05/2018 : EF 60-65%, RVSP 74.8; c.) TTE 12/2018: EF 50-55%. Sev dil LA; d.) TTE 04/2019: EF 45-50%; e.) TTE 07/2021: EF 40-45%, G3DD; f.) TTE 10/2021: EF 40-45%, mild conc LVH, septal-lat dyssynchrony (LBBB), mildly red RVSF, mild-mod dil LA, mildly dil RA, mild-mod MS, mod AS; g.) TTE 04/02/2022: EF 40-45%, glob HK, LVH, red RVSF, RVE, sev LAE, mild-mod RAE, mild MR   Chronic venous stasis dermatitis of both lower extremities    Cytomegaloviral disease 2017   Diverticulosis of colon 04/22/2013   Dyspnea    Elevated RIGHT hemidiaphragm    Erectile dysfunction    a.) on topical TRT + Trimix (alprostadil/papaverine/phentolamine) injections   ESRD (end stage renal disease) (HCC)    a.) dialysis dependent 2004-2012; b.) s/p cadaveric RIGHT renal transplant 09/13/2010   Essential hypertension 12/17/2010  GERD (gastroesophageal reflux disease)    Gout    History of 2019 novel coronavirus disease (COVID-19) 06/30/2019   History of bilateral cataract extraction 10/2017   History of renal transplant 09/13/2010   a.) s/p cadaveric donor transplant 09/13/2010   Hx of bilateral cataract extraction 10/2017   Hyperlipidemia    Hypogonadism in male 09/25/2014   Ischemic cardiomyopathy    Left cephalic vein thrombosis 06/2019   Long term current use of anticoagulant    a.) apixaban    Long term current use of immunosuppressive drug    a.) mycophenolate  + prednisone  + tacrolimus    Major depressive disorder 10/04/2013   Mitral stenosis 11/07/2021   a.) TTE 11/07/2021:  severe MAC, mild-mod MS (MPG 6 mmHg); b.) TTE 04/02/2022: no MV stenosis   Murmur    Obesity hypoventilation syndrome 03/31/2012   OSA treated with BiPAP 09/29/2012   PAH (pulmonary artery hypertension)    a. 04/2019 RHC: RA 19, RV 80/20, PA 78/31 (51), PCWP 25, CO/CI 7.69/3.26. PVR 3.25 --> Sev PAH, likely primarily PV HTN; b.) RHC 04/04/2022: mRA 13, mPA 40, mPCWP 20, PA sat 59, AO sat 92, CO 7.63, CI 3.26, PVR 2.6 --> mod portal venous HTN   Pancreatitis    S/P CABG x 4 08/13/2002   a.) LIMA-LAD, LRA-OM, SVG-D1, SVG-dRCA   S/P gastric bypass    Synovitis of finger 06/11/2019   Trigger finger, unspecified little finger 12/23/2018   Type 2 diabetes mellitus with hyperglycemia, with long-term current use of insulin  09/09/2014   a.) has FreeStyle Libre CGM   Ulcer    Left shin goes to wound care center at [facility]   Wears glasses    Wears hearing aid in both ears     Medications:  (Not in a hospital admission)   Assessment: Pharmacy consulted to dose heparin  in this 63 year old male admitted with Flu,  ACS/NSTEMI.  Pt was on Eliquis  5 mg PO  BID for Afib.  Last dose on 1/14 AM.  CrCl = 37 ml/min   Goal of Therapy:  Heparin  level 0.3-0.7 units/ml aPTT 66 - 102  seconds Monitor platelets by anticoagulation protocol: Yes   Plan:  1/15 @ 0544:  aPTT = 86,   HL = 0.90 - aPTT therapeutic X 1,  HL elevated from Eliquis  PTA - will continue pt on current rate and recheck aPTT in 6 hr - will recheck HL on 1/16 with AM labs  - use aPTT to guide dosing until correlating with HL - will check CBC daily   Wadsworth Skolnick D 04/09/2023,6:42 AM

## 2023-04-10 DIAGNOSIS — J101 Influenza due to other identified influenza virus with other respiratory manifestations: Secondary | ICD-10-CM | POA: Diagnosis not present

## 2023-04-10 LAB — BLOOD GAS, VENOUS
Bicarbonate: 27.9 mmol/L (ref 20.0–28.0)
Patient temperature: 37 mmol/L (ref 0.0–2.0)
pCO2, Ven: 65 mm[Hg] — ABNORMAL HIGH (ref 44–60)
pH, Ven: 7.24 — ABNORMAL LOW (ref 7.25–7.43)
pO2, Ven: 27.9 mmol/L (ref 32–45)

## 2023-04-10 LAB — CBC
HCT: 32.2 % — ABNORMAL LOW (ref 39.0–52.0)
Hemoglobin: 9.9 g/dL — ABNORMAL LOW (ref 13.0–17.0)
MCH: 28.3 pg (ref 26.0–34.0)
MCHC: 30.7 g/dL (ref 30.0–36.0)
MCV: 92 fL (ref 80.0–100.0)
Platelets: 111 10*3/uL — ABNORMAL LOW (ref 150–400)
RBC: 3.5 MIL/uL — ABNORMAL LOW (ref 4.22–5.81)
RDW: 15.2 % (ref 11.5–15.5)
WBC: 3.1 10*3/uL — ABNORMAL LOW (ref 4.0–10.5)
nRBC: 0 % (ref 0.0–0.2)

## 2023-04-10 LAB — APTT: aPTT: 67 s — ABNORMAL HIGH (ref 24–36)

## 2023-04-10 LAB — HEPARIN LEVEL (UNFRACTIONATED): Heparin Unfractionated: 0.55 [IU]/mL (ref 0.30–0.70)

## 2023-04-10 LAB — BASIC METABOLIC PANEL
Anion gap: 12 (ref 5–15)
BUN: 112 mg/dL — ABNORMAL HIGH (ref 8–23)
CO2: 21 mmol/L — ABNORMAL LOW (ref 22–32)
Calcium: 7.6 mg/dL — ABNORMAL LOW (ref 8.9–10.3)
Chloride: 101 mmol/L (ref 98–111)
Creatinine, Ser: 3.44 mg/dL — ABNORMAL HIGH (ref 0.61–1.24)
GFR, Estimated: 19 mL/min — ABNORMAL LOW (ref >60)
Glucose, Bld: 312 mg/dL — ABNORMAL HIGH (ref 70–99)
Potassium: 4.9 mmol/L (ref 3.5–5.1)
Sodium: 134 mmol/L — ABNORMAL LOW (ref 135–145)

## 2023-04-10 LAB — CBG MONITORING, ED
Glucose-Capillary: 321 mg/dL — ABNORMAL HIGH (ref 70–99)
Glucose-Capillary: 418 mg/dL — ABNORMAL HIGH (ref 70–99)
Glucose-Capillary: 395 mg/dL — ABNORMAL HIGH (ref 70–99)
Glucose-Capillary: 400 mg/dL — ABNORMAL HIGH (ref 70–99)

## 2023-04-10 MED ORDER — INSULIN GLARGINE-YFGN 100 UNIT/ML ~~LOC~~ SOLN
40.0000 [IU] | Freq: Every day | SUBCUTANEOUS | Status: DC
  Administered 2023-04-10 – 2023-04-13 (×4): 40 [IU] via SUBCUTANEOUS
  Filled 2023-04-10 (×5): qty 0.4

## 2023-04-10 MED ORDER — LIDOCAINE 5 % EX PTCH
1.0000 | MEDICATED_PATCH | CUTANEOUS | Status: DC
  Administered 2023-04-10 – 2023-04-13 (×3): 1 via TRANSDERMAL
  Filled 2023-04-10 (×4): qty 1

## 2023-04-10 MED ORDER — APIXABAN 5 MG PO TABS
5.0000 mg | ORAL_TABLET | Freq: Two times a day (BID) | ORAL | Status: DC
  Administered 2023-04-10 – 2023-04-13 (×7): 5 mg via ORAL
  Filled 2023-04-10 (×7): qty 1

## 2023-04-10 MED ORDER — TORSEMIDE 20 MG PO TABS
80.0000 mg | ORAL_TABLET | Freq: Two times a day (BID) | ORAL | Status: DC
Start: 2023-04-11 — End: 2023-04-11

## 2023-04-10 MED ORDER — INSULIN ASPART 100 UNIT/ML IJ SOLN
15.0000 [IU] | Freq: Once | INTRAMUSCULAR | Status: AC
  Administered 2023-04-10: 15 [IU] via SUBCUTANEOUS
  Filled 2023-04-10: qty 1

## 2023-04-10 MED ORDER — PREDNISONE 20 MG PO TABS
40.0000 mg | ORAL_TABLET | Freq: Every day | ORAL | Status: DC
  Administered 2023-04-10: 40 mg via ORAL
  Filled 2023-04-10: qty 2

## 2023-04-10 MED ORDER — INSULIN ASPART 100 UNIT/ML IJ SOLN
0.0000 [IU] | Freq: Every day | INTRAMUSCULAR | Status: DC
  Administered 2023-04-10 – 2023-04-11 (×2): 5 [IU] via SUBCUTANEOUS
  Administered 2023-04-12: 2 [IU] via SUBCUTANEOUS
  Filled 2023-04-10 (×3): qty 1

## 2023-04-10 MED ORDER — INSULIN ASPART 100 UNIT/ML IJ SOLN
0.0000 [IU] | Freq: Three times a day (TID) | INTRAMUSCULAR | Status: DC
  Administered 2023-04-10 – 2023-04-11 (×3): 15 [IU] via SUBCUTANEOUS
  Administered 2023-04-11: 11 [IU] via SUBCUTANEOUS
  Administered 2023-04-12: 3 [IU] via SUBCUTANEOUS
  Administered 2023-04-12: 5 [IU] via SUBCUTANEOUS
  Administered 2023-04-12: 8 [IU] via SUBCUTANEOUS
  Filled 2023-04-10 (×7): qty 1

## 2023-04-10 NOTE — Progress Notes (Signed)
PHARMACY - ANTICOAGULATION CONSULT NOTE  Pharmacy Consult for Heparin  Indication: chest pain/ACS  Allergies  Allergen Reactions   Contrast Media [Iodinated Contrast Media] Other (See Comments)    Kidney transplant   Iodine Other (See Comments)    Other reaction(s): Other (See Comments) Kidney transplant   Nsaids Other (See Comments)    Kidney transplant   Ibuprofen Other (See Comments)    Due to kidney transplant Due to kidney transplant Due to kidney transplant    Patient Measurements: Height: 5\' 10"  (177.8 cm) Weight: 111.1 kg (245 lb) IBW/kg (Calculated) : 73 Heparin Dosing Weight:  97.2 kg   Vital Signs: Temp: 97.6 F (36.4 C) (01/16 0609) Temp Source: Oral (01/16 0609) BP: 113/55 (01/16 0530) Pulse Rate: 53 (01/16 0530)  Labs: Recent Labs    04/08/23 1920 04/08/23 2148 04/09/23 0343 04/09/23 0544 04/09/23 0800 04/09/23 1137 04/10/23 0525  HGB 11.1*  --  10.8*  --   --   --  9.9*  HCT 35.4*  --  34.6*  --   --   --  32.2*  PLT 127*  --  123*  --   --   --  111*  APTT  --   --   --  86*  --  74* 67*  HEPARINUNFRC  --   --   --  0.90*  --   --   --   CREATININE 2.55*  --  2.80*  --   --   --  3.44*  TROPONINIHS 442*   < > 409* 358* 338* 256*  --    < > = values in this interval not displayed.    Estimated Creatinine Clearance: 27.4 mL/min (A) (by C-G formula based on SCr of 3.44 mg/dL (H)).   Medical History: Past Medical History:  Diagnosis Date   Anal condyloma    Anemia    Aortic atherosclerosis (HCC)    Aortic stenosis    a.) TTE 10/2021: Mod AS. AVA 1.06cm^2 (VTI). Mean grad ; b.) TTE 04/02/2022: no AV stenosis   Asthma, persistent controlled 02/25/2013   Attention or concentration deficit 04/26/2021   BPH with obstruction/lower urinary tract symptoms 09/25/2014   CAD (coronary artery disease) 2005   a.) LHC 1996 -> HG stenosis mLAD -> PTCA/PCI with stent x 1 (unk type) -> complicated by CFA pseudoanurysm (required surg);  b.) LHC  08/10/2002  -> 95% LAD, 70% ISR LAD, 80% pOM, 70% mRCA --> CVTS consult; c.) 4v CABG 08/13/2002; c.) 03/2018 MV: Small, fixed inferoapical and apical sep defect. No isc. EF 47% (60-65% by 05/2018 TTE); d.) 02/2021 MV: small Apical inf fixed defect. No isc -> low risk.   Cardiomegaly    Cellulitis and abscess of left leg 07/22/2020   Chronic atrial fibrillation    a.) CHA2DS2VASc = 4 (CHF, HTN, vascular disease history, T2DM);  b.) rate/rhythm maintained without pharmacological intervention; chronically anticoagulated with apixaban   Chronic combined systolic and diastolic CHF (congestive heart failure)    a.) TTE 7/18 : EF 45-50%; b.) TTE 05/2018 : EF 60-65%, RVSP 74.8; c.) TTE 12/2018: EF 50-55%. Sev dil LA; d.) TTE 04/2019: EF 45-50%; e.) TTE 07/2021: EF 40-45%, G3DD; f.) TTE 10/2021: EF 40-45%, mild conc LVH, septal-lat dyssynchrony (LBBB), mildly red RVSF, mild-mod dil LA, mildly dil RA, mild-mod MS, mod AS; g.) TTE 04/02/2022: EF 40-45%, glob HK, LVH, red RVSF, RVE, sev LAE, mild-mod RAE, mild MR   Chronic venous stasis dermatitis of both lower extremities  Cytomegaloviral disease 2017   Diverticulosis of colon 04/22/2013   Dyspnea    Elevated RIGHT hemidiaphragm    Erectile dysfunction    a.) on topical TRT + Trimix (alprostadil/papaverine/phentolamine) injections   ESRD (end stage renal disease) (HCC)    a.) dialysis dependent 2004-2012; b.) s/p cadaveric RIGHT renal transplant 09/13/2010   Essential hypertension 12/17/2010   GERD (gastroesophageal reflux disease)    Gout    History of 2019 novel coronavirus disease (COVID-19) 06/30/2019   History of bilateral cataract extraction 10/2017   History of renal transplant 09/13/2010   a.) s/p cadaveric donor transplant 09/13/2010   Hx of bilateral cataract extraction 10/2017   Hyperlipidemia    Hypogonadism in male 09/25/2014   Ischemic cardiomyopathy    Left cephalic vein thrombosis 06/2019   Long term current use of anticoagulant    a.)  apixaban   Long term current use of immunosuppressive drug    a.) mycophenolate + prednisone + tacrolimus   Major depressive disorder 10/04/2013   Mitral stenosis 11/07/2021   a.) TTE 11/07/2021: severe MAC, mild-mod MS (MPG 6 mmHg); b.) TTE 04/02/2022: no MV stenosis   Murmur    Obesity hypoventilation syndrome 03/31/2012   OSA treated with BiPAP 09/29/2012   PAH (pulmonary artery hypertension)    a. 04/2019 RHC: RA 19, RV 80/20, PA 78/31 (51), PCWP 25, CO/CI 7.69/3.26. PVR 3.25 --> Sev PAH, likely primarily PV HTN; b.) RHC 04/04/2022: mRA 13, mPA 40, mPCWP 20, PA sat 59, AO sat 92, CO 7.63, CI 3.26, PVR 2.6 --> mod portal venous HTN   Pancreatitis    S/P CABG x 4 08/13/2002   a.) LIMA-LAD, LRA-OM, SVG-D1, SVG-dRCA   S/P gastric bypass    Synovitis of finger 06/11/2019   Trigger finger, unspecified little finger 12/23/2018   Type 2 diabetes mellitus with hyperglycemia, with long-term current use of insulin 09/09/2014   a.) has FreeStyle Libre CGM   Ulcer    Left shin goes to wound care center at [facility]   Wears glasses    Wears hearing aid in both ears     Assessment: Pharmacy consulted to monitor and adjust heparin in this 63 year old male admitted with Flu,  ACS/NSTEMI.  Pt was on Eliquis 5 mg PO  BID for Afib.  Last dose on 1/14 AM.  Goal of Therapy:  Heparin level 0.3-0.7 units/ml aPTT 66 - 102  seconds Monitor platelets by anticoagulation protocol: Yes   Plan: aPTT therapeutic but at the lower end and trend is down ---adjust heparin infusion rate to 1450 units/hr ---recheck aPTT in 8 hours after rate adjustment - will recheck HL on 1/17 with AM labs  - use aPTT to guide dosing until correlating with HL - will check CBC daily   Lowella Bandy, PharmD Clinical Pharmacist 04/10/2023,7:26 AM

## 2023-04-10 NOTE — Inpatient Diabetes Management (Signed)
Inpatient Diabetes Program Recommendations  AACE/ADA: New Consensus Statement on Inpatient Glycemic Control (2015)  Target Ranges:  Prepandial:   less than 140 mg/dL      Peak postprandial:   less than 180 mg/dL (1-2 hours)      Critically ill patients:  140 - 180 mg/dL   Lab Results  Component Value Date   GLUCAP 321 (H) 04/10/2023   HGBA1C 7.7 (H) 04/09/2023    Review of Glycemic Control  Latest Reference Range & Units 04/09/23 11:23 04/09/23 16:22 04/09/23 21:48 04/10/23 08:46  Glucose-Capillary 70 - 99 mg/dL 295 (H) 621 (H) 308 (H) 321 (H)  (H): Data is abnormally high Diabetes history: Type 2 DM Outpatient Diabetes medications: Humalog 10-20 units TID, Mounjaro 5 mg Qwk, Farxiga 10 mg every day, Semglee 40 units QD Current orders for Inpatient glycemic control: Semglee 40 units every day, Novolog 0-9 units TID & Novolog 0-5 units at bedtime, Farxiga 10 mg every day Solumedrol 125 mg x 1, tapered to Prednisone 40 mg QD  Inpatient Diabetes Program Recommendations:    Consider adding Novolog 3 units TID (assuming patient is consuming >50% of meals)  Thanks, Lujean Rave, MSN, RNC-OB Diabetes Coordinator [phone] (8a-5p)

## 2023-04-10 NOTE — Progress Notes (Addendum)
Progress Note    [name].  ZOX:[id number] DOB: January 05, 1961  DOA: 04/08/2023 PCP: [name], MD      Brief Narrative:    Medical records reviewed and are as summarized below:  [name]. is a 63 y.o. male  with medical history significant of s/p of renal transplant on immunosuppressant, CKD-4, HTN, HLD, DM, CAD, CBAG, CHF with with midrange EF 45-50%, gout, depression, ADHD, BPH, former smoker, OSA on BiPAP, obesity, left cephalic vein thrombus and A-fib on Eliquis, who presented to the hospital with shortness of breath.  He said he went on a cruise about a week prior to admission.  He fell while on the cruise and fractured 3 ribs on the right side.  He complained of cough productive of yellowish sputum and shortness of breath that started about 4 days prior to admission.  Also complained of watery stools and pain on the right side of the rib cage.  He tested positive for influenza A infection.  He was found to have bilateral pneumonia and acute hypoxemic and hypercapnic respiratory failure.     Assessment/Plan:   Principal Problem:   Influenza A Active Problems:   CAD s/p CABG 2005   Myocardial injury   Chronic HFrEF (heart failure with reduced ejection fraction) (HCC)   Hypertension   Hyperlipidemia   Renal transplant recipient, 2012   Chronic atrial fibrillation (HCC)   CKD (chronic kidney disease) stage 4, GFR 15-29 ml/min (HCC)   OSA (obstructive sleep apnea)   Right rib fracture   BPH (benign prostatic hyperplasia)   Thrombocytopenia (HCC)   Diarrhea   Type II diabetes mellitus with renal manifestations (HCC)   Obesity (BMI 30-39.9)   Body mass index is 35.15 kg/m.  (Obesity)   Influenza A infection, community-acquired pneumonia, probably underlying bacterial pneumonia as well: Continue Tamiflu, IV azithromycin and ceftriaxone.  Ceftriaxone and azithromycin.  No growth on blood culture and sputum culture thus far.      Acute hypoxemic and hypercapnic respiratory failure: Improving.  He is down from 5 L to 2 L/min oxygen.  Wean off oxygen as able.  He previously required BiPAP on admission.  Use BiPAP at night for OSA.   Acute metabolic encephalopathy: Improved   Acute diarrheal illness: No diarrhea since admission.    Elevated troponins: No chest pain.  This is likely from demand ischemia.   Discontinue IV heparin drip and restart Eliquis.   Acute on chronic thrombocytopenia: Platelet is trending down.  Continue to monitor.     Right 3rd through 5th rib fractures: Analgesics as needed for pain.  Incentive spirometry as needed.   Type II DM with hyperglycemia: Discontinue IV Solu-Medrol and start prednisone.  Increase insulin glargine from 20 units to 40 units daily.   He was taking 40 units at home.   CKD stage IV, history of renal transplant in 2012: Creatinine trending upward but still within stage IV.  Hold torsemide today.  Continue to monitor. Continue tacrolimus, mycophenolate and steroids.   Comorbidities include CHF with midrange EF (45 to 50%), permanent atrial fibrillation on Eliquis, hypertension, hyperlipidemia, OSA on BiPAP, BPH, rheumatoid arthritis (on prednisone 5 mg long-term), anemia of chronic disease   Diet Order             Diet heart healthy/carb modified Fluid consistency: Thin  Diet effective now  Consultants: None  Procedures: None    Medications:    amLODipine  5 mg Oral Daily   apixaban  5 mg Oral BID   aspirin EC  81 mg Oral Daily   buPROPion  150 mg Oral BID   cholecalciferol  1,000 Units Oral QHS   colchicine  0.6 mg Oral QODAY   dapagliflozin propanediol  10 mg Oral QAC breakfast   febuxostat  120 mg Oral Daily   gabapentin  300 mg Oral BID   insulin aspart  0-5 Units Subcutaneous QHS   insulin aspart  0-9 Units Subcutaneous TID WC   insulin glargine-yfgn  40 Units Subcutaneous Q breakfast    lidocaine  1 patch Transdermal Q24H   montelukast  10 mg Oral QHS   multivitamin with minerals  1 tablet Oral Daily   mycophenolate  360 mg Oral BID   oseltamivir  30 mg Oral BID   pantoprazole  40 mg Oral Daily   predniSONE  40 mg Oral Q breakfast   tacrolimus  2 mg Oral q morning   And   tacrolimus  1 mg Oral QPM   tamsulosin  0.4 mg Oral Daily   [START ON 04/11/2023] torsemide  80 mg Oral BID   zinc sulfate (50mg  elemental zinc)  220 mg Oral Daily   Continuous Infusions:  azithromycin Stopped (04/09/23 2326)   cefTRIAXone (ROCEPHIN)  IV Stopped (04/10/23 0000)     Anti-infectives (From admission, onward)    Start     Dose/Rate Route Frequency Ordered Stop   04/09/23 2300  cefTRIAXone (ROCEPHIN) 2 g in sodium chloride 0.9 % 100 mL IVPB        2 g 200 mL/hr over 30 Minutes Intravenous Every 24 hours 04/09/23 0044     04/09/23 2200  azithromycin (ZITHROMAX) 500 mg in sodium chloride 0.9 % 250 mL IVPB        500 mg 250 mL/hr over 60 Minutes Intravenous Every 24 hours 04/09/23 0044     04/08/23 2245  oseltamivir (TAMIFLU) capsule 30 mg        30 mg Oral 2 times daily 04/08/23 2239 04/13/23 2159   04/08/23 2215  cefTRIAXone (ROCEPHIN) 2 g in sodium chloride 0.9 % 100 mL IVPB        2 g 200 mL/hr over 30 Minutes Intravenous Once 04/08/23 2208 04/08/23 2347   04/08/23 2215  azithromycin (ZITHROMAX) 500 mg in sodium chloride 0.9 % 250 mL IVPB        500 mg 250 mL/hr over 60 Minutes Intravenous  Once 04/08/23 2208 04/09/23 0055   04/08/23 2200  oseltamivir (TAMIFLU) capsule 30 mg  Status:  Discontinued        30 mg Oral  Once 04/08/23 2151 04/08/23 2221              Family Communication/Anticipated D/C date and plan/Code Status   DVT prophylaxis:  apixaban (ELIQUIS) tablet 5 mg     Code Status: Full Code  Family Communication: Plan discussed with his wife over the phone Disposition Plan: Plan to discharge home   Status is: Inpatient Remains inpatient appropriate  because: Pneumonia       Subjective:   Interval events noted.  He is feeling better.  He complains of cough.  No shortness of breath or chest pain.  Objective:    Vitals:   04/10/23 0530 04/10/23 0609 04/10/23 0818 04/10/23 0830  BP: (!) 113/55   (!) 114/57  Pulse: (!) 53   Marland Kitchen)  52  Resp: 14   17  Temp:  97.6 F (36.4 C) 97.9 F (36.6 C)   TempSrc:  Oral Oral   SpO2: 100%   98%  Weight:      Height:       No data found.   Intake/Output Summary (Last 24 hours) at 04/10/2023 1228 Last data filed at 04/10/2023 1021 Gross per 24 hour  Intake 330.76 ml  Output 550 ml  Net -219.24 ml   Filed Weights   04/08/23 1917  Weight: 111.1 kg    Exam:  GEN: NAD SKIN: Warm and dry EYES: No pallor or icterus ENT: MMM CV: Irregular rate and rhythm PULM: Bilateral expiratory wheezing ABD: soft, ND, NT, +BS CNS: AAO x 3, non focal EXT: No edema or tenderness.  Chronic dark discoloration of bilateral legs       Data Reviewed:   I have personally reviewed following labs and imaging studies:  Labs: Labs show the following:   Basic Metabolic Panel: Recent Labs  Lab 04/08/23 1920 04/09/23 0343 04/10/23 0525  NA 136 138 134*  K 3.9 4.7 4.9  CL 100 104 101  CO2 21* 22 21*  GLUCOSE 103* 121* 312*  BUN 83* 93* 112*  CREATININE 2.55* 2.80* 3.44*  CALCIUM 8.6* 8.4* 7.6*   GFR Estimated Creatinine Clearance: 27.4 mL/min (A) (by C-G formula based on SCr of 3.44 mg/dL (H)). Liver Function Tests: No results for input(s): "AST", "ALT", "ALKPHOS", "BILITOT", "PROT", "ALBUMIN" in the last 168 hours. No results for input(s): "LIPASE", "AMYLASE" in the last 168 hours. No results for input(s): "AMMONIA" in the last 168 hours. Coagulation profile No results for input(s): "INR", "PROTIME" in the last 168 hours.  CBC: Recent Labs  Lab 04/08/23 1920 04/09/23 0343 04/10/23 0525  WBC 10.2 10.3 3.1*  HGB 11.1* 10.8* 9.9*  HCT 35.4* 34.6* 32.2*  MCV 91.7 90.3 92.0  PLT  127* 123* 111*   Cardiac Enzymes: No results for input(s): "CKTOTAL", "CKMB", "CKMBINDEX", "TROPONINI" in the last 168 hours. BNP (last 3 results) Recent Labs    10/21/22 1214  PROBNP 4,402*   CBG: Recent Labs  Lab 04/09/23 1123 04/09/23 1622 04/09/23 2148 04/10/23 0846 04/10/23 1212  GLUCAP 244* 221* 247* 321* 418*   D-Dimer: No results for input(s): "DDIMER" in the last 72 hours. Hgb A1c: Recent Labs    04/09/23 0343  HGBA1C 7.7*   Lipid Profile: Recent Labs    04/09/23 0343  CHOL 62  HDL 28*  LDLCALC 17  TRIG 87  CHOLHDL 2.2   Thyroid function studies: No results for input(s): "TSH", "T4TOTAL", "T3FREE", "THYROIDAB" in the last 72 hours.  Invalid input(s): "FREET3" Anemia work up: No results for input(s): "VITAMINB12", "FOLATE", "FERRITIN", "TIBC", "IRON", "RETICCTPCT" in the last 72 hours. Sepsis Labs: Recent Labs  Lab 04/08/23 1920 04/08/23 2148 04/08/23 2247 04/09/23 0343 04/10/23 0525  PROCALCITON  --  1.03  --   --   --   WBC 10.2  --   --  10.3 3.1*  LATICACIDVEN  --   --  0.9  --   --     Microbiology Recent Results (from the past 240 hours)  Resp panel by RT-PCR (RSV, Flu A&B, Covid) Anterior Nasal Swab     Status: Abnormal   Collection Time: 04/08/23  7:20 PM   Specimen: Anterior Nasal Swab  Result Value Ref Range Status   SARS Coronavirus 2 by RT PCR NEGATIVE NEGATIVE Final    Comment: (NOTE) SARS-CoV-2  target nucleic acids are NOT DETECTED.  The SARS-CoV-2 RNA is generally detectable in upper respiratory specimens during the acute phase of infection. The lowest concentration of SARS-CoV-2 viral copies this assay can detect is 138 copies/mL. A negative result does not preclude SARS-Cov-2 infection and should not be used as the sole basis for treatment or other patient management decisions. A negative result may occur with  improper specimen collection/handling, submission of specimen other than nasopharyngeal swab, presence of  viral mutation(s) within the areas targeted by this assay, and inadequate number of viral copies(<138 copies/mL). A negative result must be combined with clinical observations, patient history, and epidemiological information. The expected result is Negative.  Fact Sheet for Patients:  BloggerCourse.com  Fact Sheet for Healthcare Providers:  SeriousBroker.it  This test is no t yet approved or cleared by the [place] FDA and  has been authorized for detection and/or diagnosis of SARS-CoV-2 by FDA under an Emergency Use Authorization (EUA). This EUA will remain  in effect (meaning this test can be used) for the duration of the COVID-19 declaration under Section 564(b)(1) of the Act, 21 U.S.C.section 360bbb-3(b)(1), unless the authorization is terminated  or revoked sooner.       Influenza A by PCR POSITIVE (A) NEGATIVE Final   Influenza B by PCR NEGATIVE NEGATIVE Final    Comment: (NOTE) The Xpert Xpress SARS-CoV-2/FLU/RSV plus assay is intended as an aid in the diagnosis of influenza from Nasopharyngeal swab specimens and should not be used as a sole basis for treatment. Nasal washings and aspirates are unacceptable for Xpert Xpress SARS-CoV-2/FLU/RSV testing.  Fact Sheet for Patients: BloggerCourse.com  Fact Sheet for Healthcare Providers: SeriousBroker.it  This test is not yet approved or cleared by the [place] FDA and has been authorized for detection and/or diagnosis of SARS-CoV-2 by FDA under an Emergency Use Authorization (EUA). This EUA will remain in effect (meaning this test can be used) for the duration of the COVID-19 declaration under Section 564(b)(1) of the Act, 21 U.S.C. section 360bbb-3(b)(1), unless the authorization is terminated or revoked.     Resp Syncytial Virus by PCR NEGATIVE NEGATIVE Final    Comment: (NOTE) Fact Sheet for  Patients: BloggerCourse.com  Fact Sheet for Healthcare Providers: SeriousBroker.it  This test is not yet approved or cleared by the [place] FDA and has been authorized for detection and/or diagnosis of SARS-CoV-2 by FDA under an Emergency Use Authorization (EUA). This EUA will remain in effect (meaning this test can be used) for the duration of the COVID-19 declaration under Section 564(b)(1) of the Act, 21 U.S.C. section 360bbb-3(b)(1), unless the authorization is terminated or revoked.  Performed at [facility], [address] [place], [place] [address]   Blood culture (routine x 2)     Status: None (Preliminary result)   Collection Time: 04/08/23 10:31 PM   Specimen: BLOOD  Result Value Ref Range Status   Specimen Description BLOOD RIGHT FOREARM  Final   Special Requests   Final    BOTTLES DRAWN AEROBIC AND ANAEROBIC Blood Culture results may not be optimal due to an inadequate volume of blood received in culture bottles   Culture   Final    NO GROWTH 1 DAY Performed at [facility], [address], [place], [place] [address]    Report Status PENDING  Incomplete  Blood culture (routine x 2)     Status: None (Preliminary result)   Collection Time: 04/08/23 10:47 PM   Specimen: BLOOD  Result Value  Ref Range Status   Specimen Description BLOOD RIGHT ASSIST CONTROL  Final   Special Requests   Final    BOTTLES DRAWN AEROBIC AND ANAEROBIC Blood Culture adequate volume   Culture   Final    NO GROWTH 1 DAY Performed at [facility], [address], [place], [place] [address]    Report Status PENDING  Incomplete  Expectorated Sputum Assessment w Gram Stain, Rflx to Resp Cult     Status: None   Collection Time: 04/09/23 11:37 AM   Specimen: Sputum  Result Value Ref Range Status   Specimen Description SPUTUM  Final   Special Requests NONE  Final   Sputum evaluation   Final    THIS SPECIMEN  IS ACCEPTABLE FOR SPUTUM CULTURE Performed at [facility], [address] [place], [place] [address]    Report Status 04/09/2023 FINAL  Final  Culture, Respiratory w Gram Stain     Status: None (Preliminary result)   Collection Time: 04/09/23 11:37 AM   Specimen: SPU  Result Value Ref Range Status   Specimen Description   Final    SPUTUM Performed at [facility], [address] [place], [place] [address]    Special Requests   Final    NONE Reflexed from [id number] Performed at [name], [address] [place], [place] [address]    Gram Stain   Final    ABUNDANT WBC PRESENT, PREDOMINANTLY PMN RARE GRAM POSITIVE RODS RARE GRAM POSITIVE COCCI    Culture   Final    CULTURE REINCUBATED FOR BETTER GROWTH Performed at [facility], [address] [place], [place] [address]    Report Status PENDING  Incomplete    Procedures and diagnostic studies:  CT CHEST ABDOMEN PELVIS WO CONTRAST Result Date: 04/08/2023 CLINICAL DATA:  Fall, right rib fractures, shortness of breath, cough EXAM: CT CHEST, ABDOMEN AND PELVIS WITHOUT CONTRAST TECHNIQUE: Multidetector CT imaging of the chest, abdomen and pelvis was performed following the standard protocol without IV contrast. RADIATION DOSE REDUCTION: This exam was performed according to the departmental dose-optimization program which includes automated exposure control, adjustment of the mA and/or kV according to patient size and/or use of iterative reconstruction technique. COMPARISON:  Chest radiograph dated 04/08/2023. CT chest abdomen pelvis dated 06/21/2022. FINDINGS: CT CHEST FINDINGS Cardiovascular: Mild cardiomegaly.  No pericardial effusion. No evidence of thoracic aortic aneurysm. Atherosclerotic calcifications of the aortic arch. Severe three-vessel coronary atherosclerosis. Mediastinum/Nodes: Calcified mediastinal nodes, benign. Visualized thyroid is unremarkable. Lungs/Pleura: Patchy/nodular left upper  lobe opacities, suggesting pneumonia. Evaluation lung parenchyma is constrained by respiratory motion. Within that constraint, there are no suspicious pulmonary nodules. No pleural effusion or pneumothorax. Musculoskeletal: Degenerative changes of the mid/lower thoracic spine. Median sternotomy. Nondisplaced right posterior 3rd through 5th rib fractures. CT ABDOMEN PELVIS FINDINGS Hepatobiliary: Unenhanced liver is grossly unremarkable. Status post cholecystectomy. No intrahepatic or extrahepatic dilatation but Pancreas: Within normal limits. Spleen: At the mL limits of normal for size. Adrenals/Urinary Tract: Adrenal glands are within normal limits. Bilateral native renal atrophy with bilateral renal vascular calcifications. No hydronephrosis. Right lower quadrant renal transplant, without hydronephrosis. Mildly thick-walled bladder with perivesical stranding (series 2/image 117). Stomach/Bowel: Postsurgical changes related to partial gastrectomy. No evidence of bowel obstruction. Appendix is not discretely visualized. No colonic wall thickening or inflammatory changes. Vascular/Lymphatic: No evidence of abdominal aortic aneurysm. Atherosclerotic calcifications of the abdominal aorta and branch vessels. No suspicious abdominopelvic lymphadenopathy. Reproductive: Prostate is notable for postsurgical changes related to prior  urolift procedure. Other: No abdominopelvic ascites. Mild postsurgical changes along the right anterior abdominal wall. Musculoskeletal: Mild degenerative changes of the lumbar spine. No fracture is seen. IMPRESSION: Nondisplaced right posterior 3rd through 5th rib fractures. No pneumothorax. Suspected left upper lobe pneumonia. Mildly thick-walled bladder with perivesical stranding, correlate for cystitis. Additional ancillary findings as above. Aortic Atherosclerosis (ICD10-I70.0). Electronically Signed   By: Charline Bills M.D.   On: 04/08/2023 21:59   CT HEAD WO CONTRAST ( ) Result  Date: 04/08/2023 CLINICAL DATA:  Head trauma, abnormal mental status (Age 52-64y); Ataxia, cervical trauma EXAM: CT HEAD WITHOUT CONTRAST CT CERVICAL SPINE WITHOUT CONTRAST TECHNIQUE: Multidetector CT imaging of the head and cervical spine was performed following the standard protocol without intravenous contrast. Multiplanar CT image reconstructions of the cervical spine were also generated. RADIATION DOSE REDUCTION: This exam was performed according to the departmental dose-optimization program which includes automated exposure control, adjustment of the mA and/or kV according to patient size and/or use of iterative reconstruction technique. COMPARISON:  None Available. FINDINGS: CT HEAD FINDINGS Brain: No hemorrhage. No hydrocephalus. No extra-axial fluid collection. No mass effect. No mass lesion. No CT evidence of an acute cortical infarct. Vascular: No hyperdense vessel or unexpected calcification. Skull: Normal. Negative for fracture or focal lesion. Sinuses/Orbits: No middle ear or mastoid effusion. Paranasal sinuses are notable for mucosal thickening in the left ethmoid sinus. Bilateral lens replacement. Orbits are otherwise unremarkable. Other: None. CT CERVICAL SPINE FINDINGS Alignment: Normal. Skull base and vertebrae: No acute fracture. No primary bone lesion or focal pathologic process. Soft tissues and spinal canal: No prevertebral fluid or swelling. No visible canal hematoma. Disc levels: Calcified disc bulge C3-C4 results in mild-to-moderate spinal canal narrowing. Upper chest: Negative. Other: None IMPRESSION: 1. No acute intracranial abnormality. 2. No acute fracture or traumatic subluxation of the cervical spine. 3. Calcified disc bulge C3-C4 results in mild-to-moderate spinal canal narrowing. Electronically Signed   By: Lorenza Cambridge M.D.   On: 04/08/2023 21:42   CT Cervical Spine Wo Contrast Result Date: 04/08/2023 CLINICAL DATA:  Head trauma, abnormal mental status (Age 16-64y); Ataxia,  cervical trauma EXAM: CT HEAD WITHOUT CONTRAST CT CERVICAL SPINE WITHOUT CONTRAST TECHNIQUE: Multidetector CT imaging of the head and cervical spine was performed following the standard protocol without intravenous contrast. Multiplanar CT image reconstructions of the cervical spine were also generated. RADIATION DOSE REDUCTION: This exam was performed according to the departmental dose-optimization program which includes automated exposure control, adjustment of the mA and/or kV according to patient size and/or use of iterative reconstruction technique. COMPARISON:  None Available. FINDINGS: CT HEAD FINDINGS Brain: No hemorrhage. No hydrocephalus. No extra-axial fluid collection. No mass effect. No mass lesion. No CT evidence of an acute cortical infarct. Vascular: No hyperdense vessel or unexpected calcification. Skull: Normal. Negative for fracture or focal lesion. Sinuses/Orbits: No middle ear or mastoid effusion. Paranasal sinuses are notable for mucosal thickening in the left ethmoid sinus. Bilateral lens replacement. Orbits are otherwise unremarkable. Other: None. CT CERVICAL SPINE FINDINGS Alignment: Normal. Skull base and vertebrae: No acute fracture. No primary bone lesion or focal pathologic process. Soft tissues and spinal canal: No prevertebral fluid or swelling. No visible canal hematoma. Disc levels: Calcified disc bulge C3-C4 results in mild-to-moderate spinal canal narrowing. Upper chest: Negative. Other: None IMPRESSION: 1. No acute intracranial abnormality. 2. No acute fracture or traumatic subluxation of the cervical spine. 3. Calcified disc bulge C3-C4 results in mild-to-moderate spinal canal narrowing. Electronically Signed   By: Lorenza Cambridge  M.D.   On: 04/08/2023 21:42   DG Chest 2 View Result Date: 04/08/2023 CLINICAL DATA:  Cough EXAM: CHEST - 2 VIEW COMPARISON:  04/07/2023, CT chest 07/07/2022 FINDINGS: Post sternotomy changes. Rounded right paratracheal calcification consistent with  node is unchanged. Cardiomegaly with central congestion. No pleural effusion or pneumothorax. Streaky bilateral perihilar opacities. IMPRESSION: Cardiomegaly with central congestion. Streaky bilateral perihilar opacities question atypical infection Electronically Signed   By: Jasmine Pang M.D.   On: 04/08/2023 20:38               LOS: 2 days   Mordche Hedglin  [facility]   Pager on [email or web address]. If 7PM-7AM, please contact night-coverage at [email or web address]     04/10/2023, 12:28 PM

## 2023-04-10 NOTE — Evaluation (Signed)
Physical Therapy Evaluation Patient Details Name: [name]. MRN: [id number] DOB: 22-Oct-1960 Today's Date: 04/10/2023  History of Present Illness  [name]. is a 62yoM who comes to [facility] from PCP after concerns of CXR- pt back recently from cruise where he sustained a fall ashore 1/9 sustained fractures to ribs 3-5. Pt was at PCP with difficulty breathing, weakness. CXR shows PNA, pt is (+) influenza, BNP elevated 1400s. Pt has no baseline difficulty with mobility or balance. PMH: renal transplant on immunosuppressant, CKD-4, HTN, HLD, DM, CAD, CBAG, sCHF with EF 45-50%, gout, depression, ADHD, BPH, former smoker, OSA on BiPAP, obesity, left cephalic vein thrombus and A-fib on Eliquis.  Clinical Impression  Pt in ED gurney on entry, having trouble breathing which I correlate to being somewhat flat; BNP 1400s, pt on 2L/min, no continuous O2 monitoring, but >95% with intermittent checks. Pt able to sit up EOB, breathing much improved, no assist needed. Pt able to stand at bend side x2 minutes and maintain balance and adequate breathing/saturations. Pt educated on signs/symptoms of orthopnea, author set spt up in recliner as requested, s/p to chair at supervision level without device. Pt is has generalize malaise and fatigue which is likely related to his acute infection status, but he was AMB to toilet earlier today with nursing and moved fairly well per report, however he indicated signifcant DOE at the time. Will continue to follow. Anticipate improved tolerance to baseline mobility as illness improves.       If plan is discharge home, recommend the following: Assist for transportation;Assistance with cooking/housework;Help with stairs or ramp for entrance   Can travel by private vehicle        Equipment Recommendations Rolling walker (2 wheels)  Recommendations for Other Services       Functional Status Assessment Patient has had a recent decline in their functional status  and demonstrates the ability to make significant improvements in function in a reasonable and predictable amount of time.     Precautions / Restrictions Precautions Precautions: None Restrictions Weight Bearing Restrictions Per Provider Order: No      Mobility  Bed Mobility Overal bed mobility: Modified Independent                  Transfers Overall transfer level: Needs assistance Equipment used: None Transfers: Sit to/from Stand Sit to Stand: Supervision, From elevated surface           General transfer comment: SpO2 stable on 2L; dyspnea improved compared to lying down    Ambulation/Gait Ambulation/Gait assistance:  (will defer due to acute illness, already AMB to toilet earlier.)                Stairs            Wheelchair Mobility     Tilt Bed    Modified Rankin (Stroke Patients Only)       Balance                                             Pertinent Vitals/Pain Pain Assessment Pain Assessment: No/denies pain    Home Living Family/patient expects to be discharged to:: Private residence Living Arrangements: Spouse/significant other Available Help at Discharge: Family;Available PRN/intermittently Type of Home: House Home Access: Stairs to enter Entrance Stairs-Rails: None Entrance Stairs-Number of Steps: 3-4   Home Layout: One level Home Equipment:  Cane - single point      Prior Function Prior Level of Function : Independent/Modified Independent             Mobility Comments: IND with no AD use, was driving, grocery shopping, building shelves, doing a lot of household projects now that he is retired ADLs Comments: IND with no AD use     Extremity/Trunk Assessment                Communication      Cognition Arousal: Alert Behavior During Therapy: WFL for tasks assessed/performed, Anxious (anxious on arrival due to dyspnea/orthopnea.) Overall Cognitive Status: Within Functional Limits for  tasks assessed                                          General Comments      Exercises     Assessment/Plan    PT Assessment Patient needs continued PT services  PT Problem List Decreased strength;Decreased activity tolerance;Decreased balance;Decreased mobility;Cardiopulmonary status limiting activity       PT Treatment Interventions Gait training;Stair training;Functional mobility training;Therapeutic activities;Therapeutic exercise;Balance training;DME instruction;Patient/family education    PT Goals (Current goals can be found in the Care Plan section)  Acute Rehab PT Goals Patient Stated Goal: improve his breatrhing PT Goal Formulation: With patient Time For Goal Achievement: 04/24/23 Potential to Achieve Goals: Good    Frequency Min 1X/week     Co-evaluation               AM-PAC PT "6 Clicks" Mobility  Outcome Measure Help needed turning from your back to your side while in a flat bed without using bedrails?: None Help needed moving from lying on your back to sitting on the side of a flat bed without using bedrails?: None Help needed moving to and from a bed to a chair (including a wheelchair)?: None Help needed standing up from a chair using your arms (e.g., wheelchair or bedside chair)?: None Help needed to walk in hospital room?: A Little Help needed climbing 3-5 steps with a railing? : A Little 6 Click Score: 22    End of Session Equipment Utilized During Treatment: Oxygen Activity Tolerance: Patient tolerated treatment well;Patient limited by fatigue Patient left: in chair;with call bell/phone within reach Nurse Communication: Mobility status PT Visit Diagnosis: Difficulty in walking, not elsewhere classified (R26.2);Other abnormalities of gait and mobility (R26.89)    Time: 1437-1500 PT Time Calculation (min) (ACUTE ONLY): 23 min   Charges:   PT Evaluation $PT Eval Moderate Complexity: 1 Mod   PT General Charges $$ ACUTE PT  VISIT: 1 Visit        4:17 PM, 04/10/23 Rosamaria Lints, PT, DPT Physical Therapist - [facility]  Outpatient Physical Therapy- Main Campus [phone]     Deondra Labrador C 04/10/2023, 4:12 PM

## 2023-04-10 NOTE — ED Notes (Signed)
Patient assisted from the recliner to the bed without incident. Home cpap applied. CB in reach.

## 2023-04-10 NOTE — ED Notes (Signed)
Pt up to ambulate to toilet in the room, no problems with ambulating, one person assist

## 2023-04-11 DIAGNOSIS — J101 Influenza due to other identified influenza virus with other respiratory manifestations: Secondary | ICD-10-CM | POA: Diagnosis not present

## 2023-04-11 LAB — CBC WITH DIFFERENTIAL/PLATELET
Abs Immature Granulocytes: 0.08 10*3/uL — ABNORMAL HIGH (ref 0.00–0.07)
Basophils Absolute: 0 10*3/uL (ref 0.0–0.1)
Basophils Relative: 0 %
Eosinophils Absolute: 0 10*3/uL (ref 0.0–0.5)
Eosinophils Relative: 0 %
HCT: 30.7 % — ABNORMAL LOW (ref 39.0–52.0)
Hemoglobin: 9.8 g/dL — ABNORMAL LOW (ref 13.0–17.0)
Immature Granulocytes: 2 %
Lymphocytes Relative: 6 %
Lymphs Abs: 0.2 10*3/uL — ABNORMAL LOW (ref 0.7–4.0)
MCH: 28.7 pg (ref 26.0–34.0)
MCHC: 31.9 g/dL (ref 30.0–36.0)
MCV: 89.8 fL (ref 80.0–100.0)
Monocytes Absolute: 0.2 10*3/uL (ref 0.1–1.0)
Monocytes Relative: 5 %
Neutro Abs: 2.8 10*3/uL (ref 1.7–7.7)
Neutrophils Relative %: 87 %
Platelets: 109 10*3/uL — ABNORMAL LOW (ref 150–400)
RBC: 3.42 MIL/uL — ABNORMAL LOW (ref 4.22–5.81)
RDW: 15 % (ref 11.5–15.5)
WBC: 3.3 10*3/uL — ABNORMAL LOW (ref 4.0–10.5)
nRBC: 0 % (ref 0.0–0.2)

## 2023-04-11 LAB — BASIC METABOLIC PANEL
Anion gap: 14 (ref 5–15)
BUN: 125 mg/dL — ABNORMAL HIGH (ref 8–23)
CO2: 23 mmol/L (ref 22–32)
Calcium: 7.8 mg/dL — ABNORMAL LOW (ref 8.9–10.3)
Chloride: 96 mmol/L — ABNORMAL LOW (ref 98–111)
Creatinine, Ser: 3.64 mg/dL — ABNORMAL HIGH (ref 0.61–1.24)
GFR, Estimated: 18 mL/min — ABNORMAL LOW (ref >60)
Glucose, Bld: 362 mg/dL — ABNORMAL HIGH (ref 70–99)
Potassium: 4.7 mmol/L (ref 3.5–5.1)
Sodium: 133 mmol/L — ABNORMAL LOW (ref 135–145)

## 2023-04-11 LAB — GLUCOSE, RANDOM: Glucose, Bld: 418 mg/dL — ABNORMAL HIGH (ref 70–99)

## 2023-04-11 LAB — GLUCOSE, CAPILLARY
Glucose-Capillary: 328 mg/dL — ABNORMAL HIGH (ref 70–99)
Glucose-Capillary: 382 mg/dL — ABNORMAL HIGH (ref 70–99)
Glucose-Capillary: 403 mg/dL — ABNORMAL HIGH (ref 70–99)
Glucose-Capillary: 390 mg/dL — ABNORMAL HIGH (ref 70–99)

## 2023-04-11 LAB — LEGIONELLA PNEUMOPHILA SEROGP 1 UR AG: L. pneumophila Serogp 1 Ur Ag: NEGATIVE

## 2023-04-11 MED ORDER — AMOXICILLIN-POT CLAVULANATE 500-125 MG PO TABS: 1.0000 | ORAL_TABLET | Freq: Two times a day (BID) | ORAL | Status: DC

## 2023-04-11 MED ORDER — OSELTAMIVIR PHOSPHATE 30 MG PO CAPS
30.0000 mg | ORAL_CAPSULE | Freq: Every day | ORAL | Status: AC
  Administered 2023-04-12 – 2023-04-13 (×2): 30 mg via ORAL
  Filled 2023-04-11 (×2): qty 1

## 2023-04-11 MED ORDER — INSULIN ASPART 100 UNIT/ML IJ SOLN
3.0000 [IU] | Freq: Three times a day (TID) | INTRAMUSCULAR | Status: DC
  Administered 2023-04-11 – 2023-04-12 (×5): 3 [IU] via SUBCUTANEOUS
  Filled 2023-04-11 (×5): qty 1

## 2023-04-11 MED ORDER — AZITHROMYCIN 250 MG PO TABS
500.0000 mg | ORAL_TABLET | Freq: Every day | ORAL | Status: AC
  Administered 2023-04-11 – 2023-04-12 (×2): 500 mg via ORAL
  Filled 2023-04-11 (×2): qty 2

## 2023-04-11 MED ORDER — AMOXICILLIN-POT CLAVULANATE 500-125 MG PO TABS
1.0000 | ORAL_TABLET | Freq: Two times a day (BID) | ORAL | Status: AC
  Administered 2023-04-11 – 2023-04-12 (×4): 1 via ORAL
  Filled 2023-04-11 (×4): qty 1

## 2023-04-11 MED ORDER — TORSEMIDE 20 MG PO TABS
80.0000 mg | ORAL_TABLET | Freq: Two times a day (BID) | ORAL | Status: DC
Start: 2023-04-12 — End: 2023-04-12
  Filled 2023-04-11: qty 4

## 2023-04-11 MED ORDER — LACTATED RINGERS IV SOLN: INTRAVENOUS | Status: AC

## 2023-04-11 MED ORDER — PREDNISONE 20 MG PO TABS
20.0000 mg | ORAL_TABLET | Freq: Every day | ORAL | Status: DC
  Administered 2023-04-11: 20 mg via ORAL
  Filled 2023-04-11: qty 1

## 2023-04-11 MED ORDER — PREDNISONE 10 MG PO TABS
5.0000 mg | ORAL_TABLET | Freq: Every day | ORAL | Status: DC
Start: 2023-04-12 — End: 2023-04-13
  Administered 2023-04-12 – 2023-04-13 (×2): 5 mg via ORAL
  Filled 2023-04-11 (×2): qty 1

## 2023-04-11 NOTE — TOC Progression Note (Addendum)
Transition of Care (TOC) - Progression Note    Patient Details  Name: Axil Adoni Heinig. MRN: [id number] Date of Birth: 1960-06-13  Transition of Care [name]) CM/SW Contact  Truddie Hidden, RN Phone Number: 04/11/2023, 11:01 AM  Clinical Narrative:    Spoke with patient regarding therpay's recommendation for [facility]. He is agreeable to [facility] and does not have a preference of an agency. He has been advised the accepting agency will contact him directly at discharge to schedule the appointment for [facility]. Patient refused RW stating he has one at home already.    Referral sent and accepted by [facility] from [place].         Expected Discharge Plan and Services                                               Social Determinants of Health (SDOH) Interventions SDOH Screenings   Food Insecurity: No Food Insecurity (04/11/2023)  Housing: Low Risk  (04/11/2023)  Transportation Needs: No Transportation Needs (04/11/2023)  Utilities: Not At Risk (04/11/2023)  Depression (PHQ2-9): Medium Risk (11/04/2022)  Social Connections: Socially Integrated (04/11/2023)  Tobacco Use: Medium Risk (04/08/2023)    Readmission Risk Interventions    04/09/2023   12:05 PM 06/24/2022    9:39 AM 11/04/2021   11:44 AM  Readmission Risk Prevention Plan  Transportation Screening Complete Complete Complete  HRI or Home Care Consult  --   Social Work Consult for Recovery Care Planning/Counseling  Complete   Palliative Care Screening  Not Applicable   Medication Review Oceanographer) Complete Complete Complete  PCP or Specialist appointment within 3-5 days of discharge Complete  Complete  HRI or Home Care Consult   Complete  SW Recovery Care/Counseling Consult Complete  Complete  Palliative Care Screening Not Applicable  Not Applicable  Skilled Nursing Facility Not Applicable  Not Applicable

## 2023-04-11 NOTE — Progress Notes (Signed)
Progress Note    Emmons [name].  JXB:[id number] DOB: May 09, 1960  DOA: 04/08/2023 PCP: [name], MD      Brief Narrative:    Medical records reviewed and are as summarized below:  [name]. is a 63 y.o. male  with medical history significant of s/p of renal transplant on immunosuppressant, CKD-4, HTN, HLD, DM, CAD, CBAG, CHF with with midrange EF 45-50%, gout, depression, ADHD, BPH, former smoker, OSA on BiPAP, obesity, left cephalic vein thrombus and A-fib on Eliquis, who presented to the hospital with shortness of breath.  He said he went on a cruise about a week prior to admission.  He fell while on the cruise and fractured 3 ribs on the right side.  He complained of cough productive of yellowish sputum and shortness of breath that started about 4 days prior to admission.  Also complained of watery stools and pain on the right side of the rib cage.  He tested positive for influenza A infection.  He was found to have bilateral pneumonia and acute hypoxemic and hypercapnic respiratory failure.     Assessment/Plan:   Principal Problem:   Influenza A Active Problems:   CAD s/p CABG 2005   Myocardial injury   Chronic HFrEF (heart failure with reduced ejection fraction) (HCC)   Hypertension   Hyperlipidemia   Renal transplant recipient, 2012   Chronic atrial fibrillation (HCC)   CKD (chronic kidney disease) stage 4, GFR 15-29 ml/min (HCC)   OSA (obstructive sleep apnea)   Right rib fracture   BPH (benign prostatic hyperplasia)   Thrombocytopenia (HCC)   Diarrhea   Type II diabetes mellitus with renal manifestations (HCC)   Obesity (BMI 30-39.9)   Body mass index is 35.15 kg/m.  (Obesity)   Influenza A infection, community-acquired pneumonia, probably underlying bacterial pneumonia as well: Continue Tamiflu.  Change IV ceftriaxone to p.o. Augmentin.  Change IV azithromycin to p.o. azithromycin.  No growth on blood and sputum  cultures.   Acute hypoxemic and hypercapnic respiratory failure: Improving.  Oxygen has been weaned down from 5 L to 2 L to 1.5 L.  Wean off oxygen as able.  He previously required BiPAP on admission.  Use BiPAP at night for OSA.   Acute metabolic encephalopathy: Improved.  He is back to baseline.   Acute diarrheal illness: No diarrhea since admission.    Elevated troponins: No chest pain.  This is likely from demand ischemia.   Continue Eliquis   Acute on chronic thrombocytopenia: Platelet count is still low. Continue to monitor.     Right 3rd through 5th rib fractures: Analgesics as needed for pain.  Incentive spirometry as needed.   Type II DM with hyperglycemia: Prednisone has been decreased from 40 to 20 mg daily.  Continue glargine 40 units daily.  Add NovoLog 3 units 3 times daily with meals.  NovoLog as needed for hyperglycemia.  He was taking 40 units at home.   AKI on CKD stage IV, history of renal transplant in 2012: Creatinine still trending up.   Start low rate IV Ringer's lactate infusion.  Continue to hold torsemide. Monitor BMP. Continue tacrolimus, mycophenolate and steroids.   Comorbidities include CHF with midrange EF (45 to 50%), permanent atrial fibrillation on Eliquis, hypertension, hyperlipidemia, OSA on BiPAP, BPH, rheumatoid arthritis (on prednisone 5 mg long-term), anemia of chronic disease   Diet Order             Diet heart healthy/carb  modified Fluid consistency: Thin  Diet effective now                            Consultants: None  Procedures: None    Medications:    amLODipine  5 mg Oral Daily   apixaban  5 mg Oral BID   aspirin EC  81 mg Oral Daily   azithromycin  500 mg Oral Daily   buPROPion  150 mg Oral BID   cholecalciferol  1,000 Units Oral QHS   colchicine  0.6 mg Oral QODAY   dapagliflozin propanediol  10 mg Oral QAC breakfast   febuxostat  120 mg Oral Daily   gabapentin  300 mg Oral BID   insulin  aspart  0-15 Units Subcutaneous TID WC   insulin aspart  0-5 Units Subcutaneous QHS   insulin glargine-yfgn  40 Units Subcutaneous Q breakfast   lidocaine  1 patch Transdermal Q24H   montelukast  10 mg Oral QHS   multivitamin with minerals  1 tablet Oral Daily   mycophenolate  360 mg Oral BID   oseltamivir  30 mg Oral BID   pantoprazole  40 mg Oral Daily   predniSONE  20 mg Oral Q breakfast   tacrolimus  2 mg Oral q morning   And   tacrolimus  1 mg Oral QPM   tamsulosin  0.4 mg Oral Daily   [START ON 04/12/2023] torsemide  80 mg Oral BID   zinc sulfate (50mg  elemental zinc)  220 mg Oral Daily   Continuous Infusions:  cefTRIAXone (ROCEPHIN)  IV Stopped (04/10/23 2317)   lactated ringers       Anti-infectives (From admission, onward)    Start     Dose/Rate Route Frequency Ordered Stop   04/11/23 2200  azithromycin (ZITHROMAX) tablet 500 mg        500 mg Oral Daily 04/11/23 0901 04/13/23 2159   04/09/23 2300  cefTRIAXone (ROCEPHIN) 2 g in sodium chloride 0.9 % 100 mL IVPB        2 g 200 mL/hr over 30 Minutes Intravenous Every 24 hours 04/09/23 0044     04/09/23 2200  azithromycin (ZITHROMAX) 500 mg in sodium chloride 0.9 % 250 mL IVPB  Status:  Discontinued        500 mg 250 mL/hr over 60 Minutes Intravenous Every 24 hours 04/09/23 0044 04/11/23 0902   04/08/23 2245  oseltamivir (TAMIFLU) capsule 30 mg        30 mg Oral 2 times daily 04/08/23 2239 04/13/23 2159   04/08/23 2215  cefTRIAXone (ROCEPHIN) 2 g in sodium chloride 0.9 % 100 mL IVPB        2 g 200 mL/hr over 30 Minutes Intravenous Once 04/08/23 2208 04/08/23 2347   04/08/23 2215  azithromycin (ZITHROMAX) 500 mg in sodium chloride 0.9 % 250 mL IVPB        500 mg 250 mL/hr over 60 Minutes Intravenous  Once 04/08/23 2208 04/09/23 0055   04/08/23 2200  oseltamivir (TAMIFLU) capsule 30 mg  Status:  Discontinued        30 mg Oral  Once 04/08/23 2151 04/08/23 2221              Family Communication/Anticipated D/C  date and plan/Code Status   DVT prophylaxis:  apixaban (ELIQUIS) tablet 5 mg     Code Status: Full Code  Family Communication: None Disposition Plan: Plan to discharge home   Status is: Inpatient  Remains inpatient appropriate because: Pneumonia       Subjective:   Interval events noted.  He complains of shortness of breath with exertion.  Overall, he feels much better.  I gave him a helping hand to help him sit up at the edge of the bed.  Megan, OT, was at the bedside   Objective:    Vitals:   04/11/23 0345 04/11/23 0404 04/11/23 0600 04/11/23 0745  BP: 112/60  123/74 (!) 148/69  Pulse: 63  63 (!) 50  Resp: 14  12 16   Temp:  99 F (37.2 C)  97.7 F (36.5 C)  TempSrc:    Oral  SpO2: 98%  98% 98%  Weight:      Height:       No data found.   Intake/Output Summary (Last 24 hours) at 04/11/2023 0954 Last data filed at 04/11/2023 0413 Gross per 24 hour  Intake 221.78 ml  Output 775 ml  Net -553.22 ml   Filed Weights   04/08/23 1917  Weight: 111.1 kg    Exam:  GEN: NAD SKIN: Warm and dry.  Chronic dark discoloration of bilateral legs EYES: No pallor or icterus ENT: MMM CV: Irregular rate and rhythm PULM: Bibasilar rales, no wheezing ABD: soft, ND, NT, +BS CNS: AAO x 3, non focal EXT: No edema or tenderness       Data Reviewed:   I have personally reviewed following labs and imaging studies:  Labs: Labs show the following:   Basic Metabolic Panel: Recent Labs  Lab 04/08/23 1920 04/09/23 0343 04/10/23 0525 04/11/23 0412  NA 136 138 134* 133*  K 3.9 4.7 4.9 4.7  CL 100 104 101 96*  CO2 21* 22 21* 23  GLUCOSE 103* 121* 312* 362*  BUN 83* 93* 112* 125*  CREATININE 2.55* 2.80* 3.44* 3.64*  CALCIUM 8.6* 8.4* 7.6* 7.8*   GFR Estimated Creatinine Clearance: 25.9 mL/min (A) (by C-G formula based on SCr of 3.64 mg/dL (H)). Liver Function Tests: No results for input(s): "AST", "ALT", "ALKPHOS", "BILITOT", "PROT", "ALBUMIN" in the last 168  hours. No results for input(s): "LIPASE", "AMYLASE" in the last 168 hours. No results for input(s): "AMMONIA" in the last 168 hours. Coagulation profile No results for input(s): "INR", "PROTIME" in the last 168 hours.  CBC: Recent Labs  Lab 04/08/23 1920 04/09/23 0343 04/10/23 0525 04/11/23 0412  WBC 10.2 10.3 3.1* 3.3*  NEUTROABS  --   --   --  2.8  HGB 11.1* 10.8* 9.9* 9.8*  HCT 35.4* 34.6* 32.2* 30.7*  MCV 91.7 90.3 92.0 89.8  PLT 127* 123* 111* 109*   Cardiac Enzymes: No results for input(s): "CKTOTAL", "CKMB", "CKMBINDEX", "TROPONINI" in the last 168 hours. BNP (last 3 results) Recent Labs    10/21/22 1214  PROBNP 4,402*   CBG: Recent Labs  Lab 04/10/23 0846 04/10/23 1212 04/10/23 1633 04/10/23 2121 04/11/23 0818  GLUCAP 321* 418* 395* 400* 328*   D-Dimer: No results for input(s): "DDIMER" in the last 72 hours. Hgb A1c: Recent Labs    04/09/23 0343  HGBA1C 7.7*   Lipid Profile: Recent Labs    04/09/23 0343  CHOL 62  HDL 28*  LDLCALC 17  TRIG 87  CHOLHDL 2.2   Thyroid function studies: No results for input(s): "TSH", "T4TOTAL", "T3FREE", "THYROIDAB" in the last 72 hours.  Invalid input(s): "FREET3" Anemia work up: No results for input(s): "VITAMINB12", "FOLATE", "FERRITIN", "TIBC", "IRON", "RETICCTPCT" in the last 72 hours. Sepsis Labs: Recent Labs  Lab  04/08/23 1920 04/08/23 2148 04/08/23 2247 04/09/23 0343 04/10/23 0525 04/11/23 0412  PROCALCITON  --  1.03  --   --   --   --   WBC 10.2  --   --  10.3 3.1* 3.3*  LATICACIDVEN  --   --  0.9  --   --   --     Microbiology Recent Results (from the past 240 hours)  Resp panel by RT-PCR (RSV, Flu A&B, Covid) Anterior Nasal Swab     Status: Abnormal   Collection Time: 04/08/23  7:20 PM   Specimen: Anterior Nasal Swab  Result Value Ref Range Status   SARS Coronavirus 2 by RT PCR NEGATIVE NEGATIVE Final    Comment: (NOTE) SARS-CoV-2 target nucleic acids are NOT DETECTED.  The  SARS-CoV-2 RNA is generally detectable in upper respiratory specimens during the acute phase of infection. The lowest concentration of SARS-CoV-2 viral copies this assay can detect is 138 copies/mL. A negative result does not preclude SARS-Cov-2 infection and should not be used as the sole basis for treatment or other patient management decisions. A negative result may occur with  improper specimen collection/handling, submission of specimen other than nasopharyngeal swab, presence of viral mutation(s) within the areas targeted by this assay, and inadequate number of viral copies(<138 copies/mL). A negative result must be combined with clinical observations, patient history, and epidemiological information. The expected result is Negative.  Fact Sheet for Patients:  BloggerCourse.com  Fact Sheet for Healthcare Providers:  SeriousBroker.it  This test is no t yet approved or cleared by the [place] FDA and  has been authorized for detection and/or diagnosis of SARS-CoV-2 by FDA under an Emergency Use Authorization (EUA). This EUA will remain  in effect (meaning this test can be used) for the duration of the COVID-19 declaration under Section 564(b)(1) of the Act, 21 U.S.C.section 360bbb-3(b)(1), unless the authorization is terminated  or revoked sooner.       Influenza A by PCR POSITIVE (A) NEGATIVE Final   Influenza B by PCR NEGATIVE NEGATIVE Final    Comment: (NOTE) The Xpert Xpress SARS-CoV-2/FLU/RSV plus assay is intended as an aid in the diagnosis of influenza from Nasopharyngeal swab specimens and should not be used as a sole basis for treatment. Nasal washings and aspirates are unacceptable for Xpert Xpress SARS-CoV-2/FLU/RSV testing.  Fact Sheet for Patients: BloggerCourse.com  Fact Sheet for Healthcare Providers: SeriousBroker.it  This test is not yet approved or  cleared by the [place] FDA and has been authorized for detection and/or diagnosis of SARS-CoV-2 by FDA under an Emergency Use Authorization (EUA). This EUA will remain in effect (meaning this test can be used) for the duration of the COVID-19 declaration under Section 564(b)(1) of the Act, 21 U.S.C. section 360bbb-3(b)(1), unless the authorization is terminated or revoked.     Resp Syncytial Virus by PCR NEGATIVE NEGATIVE Final    Comment: (NOTE) Fact Sheet for Patients: BloggerCourse.com  Fact Sheet for Healthcare Providers: SeriousBroker.it  This test is not yet approved or cleared by the [place] FDA and has been authorized for detection and/or diagnosis of SARS-CoV-2 by FDA under an Emergency Use Authorization (EUA). This EUA will remain in effect (meaning this test can be used) for the duration of the COVID-19 declaration under Section 564(b)(1) of the Act, 21 U.S.C. section 360bbb-3(b)(1), unless the authorization is terminated or revoked.  Performed at [facility], [address], [place], [place] [address]   Blood culture (routine x 2)  Status: None (Preliminary result)   Collection Time: 04/08/23 10:31 PM   Specimen: BLOOD  Result Value Ref Range Status   Specimen Description BLOOD RIGHT FOREARM  Final   Special Requests   Final    BOTTLES DRAWN AEROBIC AND ANAEROBIC Blood Culture results may not be optimal due to an inadequate volume of blood received in culture bottles   Culture   Final    NO GROWTH 2 DAYS Performed at [facility], [address] [place], [place] [address]    Report Status PENDING  Incomplete  Blood culture (routine x 2)     Status: None (Preliminary result)   Collection Time: 04/08/23 10:47 PM   Specimen: BLOOD  Result Value Ref Range Status   Specimen Description BLOOD RIGHT ASSIST CONTROL  Final   Special Requests   Final    BOTTLES DRAWN AEROBIC AND  ANAEROBIC Blood Culture adequate volume   Culture   Final    NO GROWTH 2 DAYS Performed at [facility], [address] [place], [place] [address]    Report Status PENDING  Incomplete  Expectorated Sputum Assessment w Gram Stain, Rflx to Resp Cult     Status: None   Collection Time: 04/09/23 11:37 AM   Specimen: Sputum  Result Value Ref Range Status   Specimen Description SPUTUM  Final   Special Requests NONE  Final   Sputum evaluation   Final    THIS SPECIMEN IS ACCEPTABLE FOR SPUTUM CULTURE Performed at [facility], [address], [place], [place] [address]    Report Status 04/09/2023 FINAL  Final  Culture, Respiratory w Gram Stain     Status: None (Preliminary result)   Collection Time: 04/09/23 11:37 AM   Specimen: SPU  Result Value Ref Range Status   Specimen Description   Final    SPUTUM Performed at [facility], [address] [place], [place] [address]    Special Requests   Final    NONE Reflexed from [id number] Performed at [facility], [address] [place], [place] [address]    Gram Stain   Final    ABUNDANT WBC PRESENT, PREDOMINANTLY PMN RARE GRAM POSITIVE RODS RARE GRAM POSITIVE COCCI    Culture   Final    CULTURE REINCUBATED FOR BETTER GROWTH Performed at [facility], [address], [place], [place] [address]    Report Status PENDING  Incomplete    Procedures and diagnostic studies:  No results found.              LOS: 3 days   Mariaisabel Bodiford  [facility]   Pager on [email or web address]. If 7PM-7AM, please contact night-coverage at [email or web address]     04/11/2023, 9:54 AM

## 2023-04-11 NOTE — Evaluation (Signed)
Occupational Therapy Evaluation Patient Details Name: [name]. MRN: [id number] DOB: 1960/07/20 Today's Date: 04/11/2023   History of Present Illness [name]. is a 62yoM who comes to [facility] from PCP after concerns of CXR- pt back recently from cruise where he sustained a fall ashore 1/9 sustained fractures to ribs 3-5. Pt was at PCP with difficulty breathing, weakness. CXR shows PNA, pt is (+) influenza, BNP elevated 1400s. Pt has no baseline difficulty with mobility or balance. PMH: renal transplant on immunosuppressant, CKD-4, HTN, HLD, DM, CAD, CBAG, sCHF with EF 45-50%, gout, depression, ADHD, BPH, former smoker, OSA on BiPAP, obesity, left cephalic vein thrombus and A-fib on Eliquis.   Clinical Impression   Pt was seen for OT evaluation this date. Prior to hospital admission, pt was living with his wife and fully IND with no AD use, still traveling.   'Pt presents to acute OT demonstrating impaired ADL performance and functional mobility 2/2 low activity tolerance and rib pain (See OT problem list for additional functional deficits). Pt reportin 8/10 rib pain with nurse notified of his desire for pain meds. Pt currently requires MIN A to perform supine to sit, SUP for STS and for all UB bathing/dressing, oral care and peri-care bathing while standing at sink. Max A to don socks d/t rib pain, but reports wearing slip on shoes to avoid the pain. On 1.5L on entry and 99% sp02 reading, removed for bathing briefly and dropped to 89%, therefore replaced and remained in upper 90's on 1.5L for remainder of session. He ambulated in the room around the bed to sit in recliner with SUP. OT to follow acutely to ensure IND and edu on compensatory strategies for energy conservation and better pain management. Do not anticipate the need for follow up OT services upon acute hospital DC.        If plan is discharge home, recommend the following: A little help with bathing/dressing/bathroom;A  little help with walking and/or transfers;Assistance with cooking/housework;Assist for transportation    Functional Status Assessment  Patient has had a recent decline in their functional status and demonstrates the ability to make significant improvements in function in a reasonable and predictable amount of time.  Equipment Recommendations  None recommended by OT    Recommendations for Other Services       Precautions / Restrictions Precautions Precautions: None Restrictions Weight Bearing Restrictions Per Provider Order: No      Mobility Bed Mobility Overal bed mobility: Needs Assistance Bed Mobility: Supine to Sit     Supine to sit: Min assist     General bed mobility comments: MD in room and assisted pt to EOB with Min A via HHA to check his breathing sounds    Transfers Overall transfer level: Needs assistance Equipment used: None Transfers: Sit to/from Stand Sit to Stand: Supervision           General transfer comment: SUP for STS from EOB to sink and for in room mobility to recliner around the bed      Balance Overall balance assessment: No apparent balance deficits (not formally assessed)                                         ADL either performed or assessed with clinical judgement   ADL Overall ADL's : Needs assistance/impaired     Grooming: Wash/dry face;Wash/dry hands;Oral care;Standing;Supervision/safety;Modified independent;Applying deodorant  Upper Body Bathing: Set up;Standing   Lower Body Bathing: Modified independent;Sit to/from stand;Supervison/ safety Lower Body Bathing Details (indicate cue type and reason): to clean peri-area only-did not bathe below his knees     Lower Body Dressing: Maximal assistance;Sitting/lateral leans Lower Body Dressing Details (indicate cue type and reason): Max A to don socks at EOB-reports he has only been wearin slip on shoes d/t rib pain             Functional mobility during  ADLs: Supervision/safety       Vision         Perception         Praxis         Pertinent Vitals/Pain Pain Assessment Pain Assessment: 0-10 Pain Score: 8  Pain Location: R sided rib pain Pain Descriptors / Indicators: Tender, Sore Pain Intervention(s): Monitored during session, Limited activity within patient's tolerance, Repositioned, Patient requesting pain meds-RN notified     Extremity/Trunk Assessment Upper Extremity Assessment Upper Extremity Assessment: Overall WFL for tasks assessed   Lower Extremity Assessment Lower Extremity Assessment: Overall WFL for tasks assessed       Communication Communication Communication: No apparent difficulties   Cognition Arousal: Alert Behavior During Therapy: WFL for tasks assessed/performed Overall Cognitive Status: Within Functional Limits for tasks assessed                                       General Comments  sp02 89% on RA during bathing; replaced 1.5 L 02 on [place] and sp02 remained upper 90's    Exercises Other Exercises Other Exercises: Edu on role of OT in acute setting and importance of therapy to maximize safety, IND and strength while managing pain.   Shoulder Instructions      Home Living Family/patient expects to be discharged to:: Private residence Living Arrangements: Spouse/significant other Available Help at Discharge: Family;Available PRN/intermittently Type of Home: House Home Access: Stairs to enter [employer] of Steps: 3-4 Entrance Stairs-Rails: None Home Layout: One level     Bathroom Shower/Tub: Producer, television/film/video: Standard     Home Equipment: Cane - single point          Prior Functioning/Environment Prior Level of Function : Independent/Modified Independent             Mobility Comments: IND with no AD use, was driving, grocery shopping, building shelves, doing a lot of household projects now that he is retired ADLs Comments: IND  with no AD use        OT Problem List: Pain;Decreased activity tolerance      OT Treatment/Interventions: Self-care/ADL training;Therapeutic exercise;Therapeutic activities;DME and/or AE instruction;Patient/family education    OT Goals(Current goals can be found in the care plan section) Acute Rehab OT Goals Patient Stated Goal: improve breathing and pain OT Goal Formulation: With patient Time For Goal Achievement: 04/25/23 Potential to Achieve Goals: Good ADL Goals Pt Will Perform Lower Body Bathing: sitting/lateral leans;sit to/from stand;with modified independence Pt Will Perform Lower Body Dressing: with modified independence;Independently;sitting/lateral leans;sit to/from stand Pt Will Transfer to Toilet: with modified independence;Independently;regular height toilet;ambulating Pt Will Perform Toileting - Clothing Manipulation and hygiene: Independently;with modified independence;sit to/from stand;sitting/lateral leans  OT Frequency: Min 1X/week    Co-evaluation              AM-PAC OT "6 Clicks" Daily Activity     Outcome Measure Help from  another person eating meals?: None Help from another person taking care of personal grooming?: None Help from another person toileting, which includes using toliet, bedpan, or urinal?: A Little Help from another person bathing (including washing, rinsing, drying)?: A Little Help from another person to put on and taking off regular upper body clothing?: None Help from another person to put on and taking off regular lower body clothing?: A Little 6 Click Score: 21   End of Session Equipment Utilized During Treatment: Oxygen  Activity Tolerance: Patient tolerated treatment well Patient left: in chair;with call bell/phone within reach  OT Visit Diagnosis: Other abnormalities of gait and mobility (R26.89);Pain Pain - Right/Left: Right Pain - part of body:  (Ribs)                Time: 5638-7564 OT Time Calculation (min): 23  min Charges:  OT General Charges $OT Visit: 1 Visit OT Evaluation $OT Eval Low Complexity: 1 Low OT Treatments $Self Care/Home Management : 8-22 mins Marcus Groll, OTR/L 04/11/23, 11:31 AM  Tashawna Thom E Netty Sullivant 04/11/2023, 11:28 AM

## 2023-04-11 NOTE — Plan of Care (Signed)
  Problem: Education: Goal: Ability to describe self-care measures that may prevent or decrease complications (Diabetes Survival Skills Education) will improve Outcome: Progressing Goal: Individualized Educational Video(s) Outcome: Progressing   Problem: Coping: Goal: Ability to adjust to condition or change in health will improve Outcome: Progressing   Problem: Fluid Volume: Goal: Ability to maintain a balanced intake and output will improve Outcome: Progressing   Problem: Health Behavior/Discharge Planning: Goal: Ability to identify and utilize available resources and services will improve Outcome: Progressing Goal: Ability to manage health-related needs will improve Outcome: Progressing   Problem: Metabolic: Goal: Ability to maintain appropriate glucose levels will improve Outcome: Progressing   Problem: Nutritional: Goal: Maintenance of adequate nutrition will improve Outcome: Progressing Goal: Progress toward achieving an optimal weight will improve Outcome: Progressing   Problem: Skin Integrity: Goal: Risk for impaired skin integrity will decrease Outcome: Progressing   Problem: Tissue Perfusion: Goal: Adequacy of tissue perfusion will improve Outcome: Progressing   Problem: Education: Goal: Knowledge of General Education information will improve Description: Including pain rating scale, medication(s)/side effects and non-pharmacologic comfort measures Outcome: Progressing   Problem: Health Behavior/Discharge Planning: Goal: Ability to manage health-related needs will improve Outcome: Progressing   Problem: Clinical Measurements: Goal: Ability to maintain clinical measurements within normal limits will improve Outcome: Progressing Goal: Will remain free from infection Outcome: Progressing Goal: Diagnostic test results will improve Outcome: Progressing Goal: Respiratory complications will improve Outcome: Progressing Goal: Cardiovascular complication will  be avoided Outcome: Progressing   Problem: Activity: Goal: Risk for activity intolerance will decrease Outcome: Progressing   Problem: Nutrition: Goal: Adequate nutrition will be maintained Outcome: Progressing   Problem: Coping: Goal: Level of anxiety will decrease Outcome: Progressing   Problem: Elimination: Goal: Will not experience complications related to bowel motility Outcome: Progressing Goal: Will not experience complications related to urinary retention Outcome: Progressing   Problem: Pain Managment: Goal: General experience of comfort will improve and/or be controlled Outcome: Progressing   Problem: Safety: Goal: Ability to remain free from injury will improve Outcome: Progressing   Problem: Skin Integrity: Goal: Risk for impaired skin integrity will decrease Outcome: Progressing   Problem: Activity: Goal: Ability to tolerate increased activity will improve Outcome: Progressing   Problem: Clinical Measurements: Goal: Ability to maintain a body temperature in the normal range will improve Outcome: Progressing   Problem: Respiratory: Goal: Ability to maintain adequate ventilation will improve Outcome: Progressing Goal: Ability to maintain a clear airway will improve Outcome: Progressing

## 2023-04-11 NOTE — Inpatient Diabetes Management (Signed)
Inpatient Diabetes Program Recommendations  AACE/ADA: New Consensus Statement on Inpatient Glycemic Control (2015)  Target Ranges:  Prepandial:   less than 140 mg/dL      Peak postprandial:   less than 180 mg/dL (1-2 hours)      Critically ill patients:  140 - 180 mg/dL   Lab Results  Component Value Date   GLUCAP 328 (H) 04/11/2023   HGBA1C 7.7 (H) 04/09/2023    Review of Glycemic Control  Latest Reference Range & Units 04/10/23 08:46 04/10/23 12:12 04/10/23 16:33 04/10/23 21:21 04/11/23 08:18  Glucose-Capillary 70 - 99 mg/dL 161 (H) 096 (H) 045 (H) 400 (H) 328 (H)  (H): Data is abnormally high Diabetes history: Type 2 DM Outpatient Diabetes medications: Humalog 10-20 units TID, Mounjaro 5 mg Qwk, Farxiga 10 mg every day, Semglee 40 units QD Current orders for Inpatient glycemic control: Semglee 40 units every day, Novolog 0-15 units TID & Novolog 0-5 units at bedtime, Farxiga 10 mg every day Solumedrol 125 mg x 1, tapered to Prednisone 20 mg QD   Inpatient Diabetes Program Recommendations:     Consider adding Novolog 5 units TID (assuming patient is consuming >50% of meals)   Thanks, Lujean Rave, MSN, RNC-OB Diabetes Coordinator [phone] (8a-5p)

## 2023-04-11 NOTE — ED Notes (Signed)
Pt assisted to bedside commode. Wife at bedside

## 2023-04-11 NOTE — Progress Notes (Signed)
Physical Therapy Treatment Patient Details Name: [name]. MRN: [id number] DOB: 1960-05-18 Today's Date: 04/11/2023   History of Present Illness [name]. is a 62yoM who comes to [facility] from PCP after concerns of CXR- pt back recently from cruise where he sustained a fall ashore 1/9 sustained fractures to ribs 3-5. Pt was at PCP with difficulty breathing, weakness. CXR shows PNA, pt is (+) influenza, BNP elevated 1400s. Pt has no baseline difficulty with mobility or balance. PMH: renal transplant on immunosuppressant, CKD-4, HTN, HLD, DM, CAD, CBAG, sCHF with EF 45-50%, gout, depression, ADHD, BPH, former smoker, OSA on BiPAP, obesity, left cephalic vein thrombus and A-fib on Eliquis.    PT Comments  Still requires O2. VSS on 2L AMB in hallway. DOE precludes sustained distances, takes standing recovery Q40ft; no balance issues. Should be AMB 3x daily in hall with nursing.    If plan is discharge home, recommend the following: Assist for transportation;Assistance with cooking/housework;Help with stairs or ramp for entrance   Can travel by private vehicle        Equipment Recommendations  None recommended by PT    Recommendations for Other Services       Precautions / Restrictions Precautions Precautions: Fall Restrictions Weight Bearing Restrictions Per Provider Order: No     Mobility  Bed Mobility                    Transfers Overall transfer level: Needs assistance Equipment used: None Transfers: Sit to/from Stand Sit to Stand: Modified independent (Device/Increase time)           General transfer comment: effort, required, use of arms; no balance issues    Ambulation/Gait Ambulation/Gait assistance: Supervision Gait Distance (Feet): 200 Feet Assistive device: None Gait Pattern/deviations: WFL(Within Functional Limits) Gait velocity: 0.49m/s     General Gait Details: slow, 2L used; takes standing recovery due to dyspnea Q77ft;  VSS   Stairs             Wheelchair Mobility     Tilt Bed    Modified Rankin (Stroke Patients Only)       Balance                                            Cognition Arousal: Alert Behavior During Therapy: WFL for tasks assessed/performed Overall Cognitive Status: Within Functional Limits for tasks assessed                                          Exercises Other Exercises Other Exercises: 5xSTS from recliner 2L maintained    General Comments        Pertinent Vitals/Pain Pain Assessment Pain Assessment: 0-10 Pain Score: 4  Pain Location: R sided rib pain (anterior 4/10) Pain Intervention(s): Limited activity within patient's tolerance    Home Living                          Prior Function            PT Goals (current goals can now be found in the care plan section) Acute Rehab PT Goals Patient Stated Goal: improve his breatrhing PT Goal Formulation: With patient Time For Goal Achievement: 04/24/23 Potential to Achieve Goals:  Good Progress towards PT goals: Progressing toward goals    Frequency    Min 1X/week      PT Plan      Co-evaluation              AM-PAC PT "6 Clicks" Mobility   Outcome Measure  Help needed turning from your back to your side while in a flat bed without using bedrails?: None Help needed moving from lying on your back to sitting on the side of a flat bed without using bedrails?: None Help needed moving to and from a bed to a chair (including a wheelchair)?: None Help needed standing up from a chair using your arms (e.g., wheelchair or bedside chair)?: None Help needed to walk in hospital room?: A Little Help needed climbing 3-5 steps with a railing? : A Little 6 Click Score: 22    End of Session Equipment Utilized During Treatment: Oxygen Activity Tolerance: Patient tolerated treatment well;Patient limited by fatigue Patient left: in chair;with call  bell/phone within reach Nurse Communication: Mobility status PT Visit Diagnosis: Difficulty in walking, not elsewhere classified (R26.2);Other abnormalities of gait and mobility (R26.89)     Time: 2542-7062 PT Time Calculation (min) (ACUTE ONLY): 17 min  Charges:    $Therapeutic Activity: 8-22 mins PT General Charges $$ ACUTE PT VISIT: 1 Visit                    5:07 PM, 04/11/23 Rosamaria Lints, PT, DPT Physical Therapist - [facility]  [phone] (ASCOM)   Sabastian Raimondi C 04/11/2023, 5:06 PM

## 2023-04-12 DIAGNOSIS — J101 Influenza due to other identified influenza virus with other respiratory manifestations: Secondary | ICD-10-CM | POA: Diagnosis not present

## 2023-04-12 LAB — CBC WITH DIFFERENTIAL/PLATELET
Abs Immature Granulocytes: 0.06 10*3/uL (ref 0.00–0.07)
Basophils Absolute: 0 10*3/uL (ref 0.0–0.1)
Basophils Relative: 0 %
Eosinophils Absolute: 0 10*3/uL (ref 0.0–0.5)
Eosinophils Relative: 0 %
HCT: 29.2 % — ABNORMAL LOW (ref 39.0–52.0)
Hemoglobin: 9.2 g/dL — ABNORMAL LOW (ref 13.0–17.0)
Immature Granulocytes: 1 %
Lymphocytes Relative: 9 %
Lymphs Abs: 0.4 10*3/uL — ABNORMAL LOW (ref 0.7–4.0)
MCH: 28.2 pg (ref 26.0–34.0)
MCHC: 31.5 g/dL (ref 30.0–36.0)
MCV: 89.6 fL (ref 80.0–100.0)
Monocytes Absolute: 0.2 10*3/uL (ref 0.1–1.0)
Monocytes Relative: 5 %
Neutro Abs: 3.7 10*3/uL (ref 1.7–7.7)
Neutrophils Relative %: 85 %
Platelets: 108 10*3/uL — ABNORMAL LOW (ref 150–400)
RBC: 3.26 MIL/uL — ABNORMAL LOW (ref 4.22–5.81)
RDW: 15 % (ref 11.5–15.5)
WBC: 4.3 10*3/uL (ref 4.0–10.5)
nRBC: 0 % (ref 0.0–0.2)

## 2023-04-12 LAB — BASIC METABOLIC PANEL
Anion gap: 10 (ref 5–15)
BUN: 145 mg/dL — ABNORMAL HIGH (ref 8–23)
CO2: 25 mmol/L (ref 22–32)
Calcium: 8 mg/dL — ABNORMAL LOW (ref 8.9–10.3)
Chloride: 102 mmol/L (ref 98–111)
Creatinine, Ser: 3.41 mg/dL — ABNORMAL HIGH (ref 0.61–1.24)
GFR, Estimated: 19 mL/min — ABNORMAL LOW (ref >60)
Glucose, Bld: 224 mg/dL — ABNORMAL HIGH (ref 70–99)
Potassium: 4.9 mmol/L (ref 3.5–5.1)
Sodium: 137 mmol/L (ref 135–145)

## 2023-04-12 LAB — CULTURE, RESPIRATORY W GRAM STAIN: Culture: NORMAL

## 2023-04-12 LAB — GLUCOSE, CAPILLARY
Glucose-Capillary: 193 mg/dL — ABNORMAL HIGH (ref 70–99)
Glucose-Capillary: 242 mg/dL — ABNORMAL HIGH (ref 70–99)
Glucose-Capillary: 271 mg/dL — ABNORMAL HIGH (ref 70–99)
Glucose-Capillary: 250 mg/dL — ABNORMAL HIGH (ref 70–99)

## 2023-04-12 MED ORDER — TORSEMIDE 20 MG PO TABS
80.0000 mg | ORAL_TABLET | Freq: Two times a day (BID) | ORAL | Status: DC
  Administered 2023-04-13: 80 mg via ORAL
  Filled 2023-04-12: qty 4

## 2023-04-12 MED ORDER — ALUM & MAG HYDROXIDE-SIMETH 200-200-20 MG/5ML PO SUSP
15.0000 mL | Freq: Four times a day (QID) | ORAL | Status: AC | PRN
  Administered 2023-04-12: 15 mL via ORAL
  Filled 2023-04-12: qty 30

## 2023-04-12 MED ORDER — LACTATED RINGERS IV SOLN: INTRAVENOUS | Status: AC

## 2023-04-12 NOTE — Progress Notes (Signed)
Progress Note    [name].  DGU:[id number] DOB: Nov 21, 1960  DOA: 04/08/2023 PCP: [name], MD      Brief Narrative:    Medical records reviewed and are as summarized below:  Carl Hanz Jeck. is a 63 y.o. male  with medical history significant of s/p of renal transplant on immunosuppressant, CKD-4, HTN, HLD, DM, CAD, CBAG, CHF with with midrange EF 45-50%, gout, depression, ADHD, BPH, former smoker, OSA on BiPAP, obesity, left cephalic vein thrombus and A-fib on Eliquis, who presented to the hospital with shortness of breath.  He said he went on a cruise about a week prior to admission.  He fell while on the cruise and fractured 3 ribs on the right side.  He complained of cough productive of yellowish sputum and shortness of breath that started about 4 days prior to admission.  Also complained of watery stools and pain on the right side of the rib cage.  He tested positive for influenza A infection.  He was found to have bilateral pneumonia and acute hypoxemic and hypercapnic respiratory failure.     Assessment/Plan:   Principal Problem:   Influenza A Active Problems:   CAD s/p CABG 2005   Myocardial injury   Chronic HFrEF (heart failure with reduced ejection fraction) (HCC)   Hypertension   Hyperlipidemia   Renal transplant recipient, 2012   Chronic atrial fibrillation (HCC)   CKD (chronic kidney disease) stage 4, GFR 15-29 ml/min (HCC)   OSA (obstructive sleep apnea)   Right rib fracture   BPH (benign prostatic hyperplasia)   Thrombocytopenia (HCC)   Diarrhea   Type II diabetes mellitus with renal manifestations (HCC)   Obesity (BMI 30-39.9)   Body mass index is 36.75 kg/m.  (Obesity)   Influenza A infection, community-acquired pneumonia, probably underlying bacterial pneumonia as well: Continue Tamiflu.  Continue Augmentin and azithromycin.  No growth on blood and sputum cultures.   Acute hypoxemic and hypercapnic respiratory  failure: Improved.  He is tolerating room air.  He previously required BiPAP on admission and was weaned down to 5 L/min oxygen..  Use BiPAP at night for OSA.   Acute metabolic encephalopathy: Improved   Acute diarrheal illness: No diarrhea since admission.    Elevated troponins: No chest pain.  This is likely from demand ischemia.   Continue Eliquis   Acute on chronic thrombocytopenia: Platelet is low but stable.  Continue to monitor.    Right 3rd through 5th rib fractures: Analgesics as needed for pain.  Incentive spirometry as needed.   Type II DM with hyperglycemia: Prednisone has been decreased from 20 to 5 mg daily.  Continue glargine 40 units daily.  Continue NovoLog 3 units 3 times daily with meals and NovoLog as needed hyperglycemia.  He was taking 40 units of Lantus at home.   AKI on CKD stage IV, history of renal transplant in 2012: Creatinine is trending downward (3.64-3.41) with IV fluids.  Continue low rate IV fluids and discontinue later today. Continue to hold torsemide. Monitor BMP. Continue tacrolimus, mycophenolate and steroids.   Comorbidities include CHF with midrange EF (45 to 50%), permanent atrial fibrillation on Eliquis, hypertension, hyperlipidemia, OSA on BiPAP, BPH, rheumatoid arthritis (on prednisone 5 mg long-term), anemia of chronic disease   Diet Order             Diet heart healthy/carb modified Fluid consistency: Thin  Diet effective now  Consultants: None  Procedures: None    Medications:    amLODipine  5 mg Oral Daily   amoxicillin-clavulanate  1 tablet Oral BID   apixaban  5 mg Oral BID   aspirin EC  81 mg Oral Daily   azithromycin  500 mg Oral Daily   buPROPion  150 mg Oral BID   cholecalciferol  1,000 Units Oral QHS   colchicine  0.6 mg Oral QODAY   dapagliflozin propanediol  10 mg Oral QAC breakfast   febuxostat  120 mg Oral Daily   gabapentin  300 mg Oral BID   insulin aspart   0-15 Units Subcutaneous TID WC   insulin aspart  0-5 Units Subcutaneous QHS   insulin aspart  3 Units Subcutaneous TID WC   insulin glargine-yfgn  40 Units Subcutaneous Q breakfast   lidocaine  1 patch Transdermal Q24H   montelukast  10 mg Oral QHS   multivitamin with minerals  1 tablet Oral Daily   mycophenolate  360 mg Oral BID   oseltamivir  30 mg Oral Daily   pantoprazole  40 mg Oral Daily   predniSONE  5 mg Oral Q breakfast   tacrolimus  2 mg Oral q morning   And   tacrolimus  1 mg Oral QPM   tamsulosin  0.4 mg Oral Daily   [START ON 04/13/2023] torsemide  80 mg Oral BID   zinc sulfate (50mg  elemental zinc)  220 mg Oral Daily   Continuous Infusions:  lactated ringers 50 mL/hr at 04/12/23 0947     Anti-infectives (From admission, onward)    Start     Dose/Rate Route Frequency Ordered Stop   04/12/23 1000  oseltamivir (TAMIFLU) capsule 30 mg        30 mg Oral Daily 04/11/23 1615 04/14/23 0959   04/11/23 2200  azithromycin (ZITHROMAX) tablet 500 mg        500 mg Oral Daily 04/11/23 0901 04/13/23 2159   04/11/23 1045  amoxicillin-clavulanate (AUGMENTIN) 500-125 MG per tablet 1 tablet  Status:  Discontinued        1 tablet Oral 2 times daily 04/11/23 0958 04/11/23 0958   04/11/23 1045  amoxicillin-clavulanate (AUGMENTIN) 500-125 MG per tablet 1 tablet        1 tablet Oral 2 times daily 04/11/23 0958 04/13/23 0959   04/09/23 2300  cefTRIAXone (ROCEPHIN) 2 g in sodium chloride 0.9 % 100 mL IVPB  Status:  Discontinued        2 g 200 mL/hr over 30 Minutes Intravenous Every 24 hours 04/09/23 0044 04/11/23 0957   04/09/23 2200  azithromycin (ZITHROMAX) 500 mg in sodium chloride 0.9 % 250 mL IVPB  Status:  Discontinued        500 mg 250 mL/hr over 60 Minutes Intravenous Every 24 hours 04/09/23 0044 04/11/23 0902   04/08/23 2245  oseltamivir (TAMIFLU) capsule 30 mg  Status:  Discontinued        30 mg Oral 2 times daily 04/08/23 2239 04/11/23 1615   04/08/23 2215  cefTRIAXone  (ROCEPHIN) 2 g in sodium chloride 0.9 % 100 mL IVPB        2 g 200 mL/hr over 30 Minutes Intravenous Once 04/08/23 2208 04/08/23 2347   04/08/23 2215  azithromycin (ZITHROMAX) 500 mg in sodium chloride 0.9 % 250 mL IVPB        500 mg 250 mL/hr over 60 Minutes Intravenous  Once 04/08/23 2208 04/09/23 0055   04/08/23 2200  oseltamivir (TAMIFLU) capsule 30  mg  Status:  Discontinued        30 mg Oral  Once 04/08/23 2151 04/08/23 2221              Family Communication/Anticipated D/C date and plan/Code Status   DVT prophylaxis: Place and maintain sequential compression device Start: 04/11/23 1129 apixaban (ELIQUIS) tablet 5 mg     Code Status: Full Code  Family Communication: None Disposition Plan: Plan to discharge home   Status is: Inpatient Remains inpatient appropriate because: Pneumonia       Subjective:   Interval events noted.  He feels much better today.  No shortness of breath or chest pain. Sainabou, RN, was at the bedside  Objective:    Vitals:   04/12/23 0337 04/12/23 0813 04/12/23 1047 04/12/23 1139  BP: 108/67 123/75  (!) 120/55  Pulse: 64 (!) 58  (!) 58  Resp: 18 17  17   Temp: 97.8 F (36.6 C) (!) 97.3 F (36.3 C)  (!) 97.4 F (36.3 C)  TempSrc: Oral     SpO2: 95% (!) 88%  91%  Weight:   116.2 kg   Height:   5\' 10"  (1.778 m)    No data found.   Intake/Output Summary (Last 24 hours) at 04/12/2023 1543 Last data filed at 04/12/2023 1500 Gross per 24 hour  Intake 1539.48 ml  Output 225 ml  Net 1314.48 ml   Filed Weights   04/08/23 1917 04/12/23 1047  Weight: 111.1 kg 116.2 kg    Exam:  GEN: NAD, sitting up in the chair SKIN: Warm and dry EYES: No pallor or icterus ENT: MMM CV: RRR PULM: Bilateral wheezing at the lung bases ABD: soft, obese, NT, +BS CNS: AAO x 3, non focal EXT: No edema or tenderness        Data Reviewed:   I have personally reviewed following labs and imaging studies:  Labs: Labs show the following:    Basic Metabolic Panel: Recent Labs  Lab 04/08/23 1920 04/09/23 0343 04/10/23 0525 04/11/23 0412 04/11/23 1724 04/12/23 0423  NA 136 138 134* 133*  --  137  K 3.9 4.7 4.9 4.7  --  4.9  CL 100 104 101 96*  --  102  CO2 21* 22 21* 23  --  25  GLUCOSE 103* 121* 312* 362* 418* 224*  BUN 83* 93* 112* 125*  --  145*  CREATININE 2.55* 2.80* 3.44* 3.64*  --  3.41*  CALCIUM 8.6* 8.4* 7.6* 7.8*  --  8.0*   GFR Estimated Creatinine Clearance: 28.3 mL/min (A) (by C-G formula based on SCr of 3.41 mg/dL (H)). Liver Function Tests: No results for input(s): "AST", "ALT", "ALKPHOS", "BILITOT", "PROT", "ALBUMIN" in the last 168 hours. No results for input(s): "LIPASE", "AMYLASE" in the last 168 hours. No results for input(s): "AMMONIA" in the last 168 hours. Coagulation profile No results for input(s): "INR", "PROTIME" in the last 168 hours.  CBC: Recent Labs  Lab 04/08/23 1920 04/09/23 0343 04/10/23 0525 04/11/23 0412 04/12/23 0423  WBC 10.2 10.3 3.1* 3.3* 4.3  NEUTROABS  --   --   --  2.8 3.7  HGB 11.1* 10.8* 9.9* 9.8* 9.2*  HCT 35.4* 34.6* 32.2* 30.7* 29.2*  MCV 91.7 90.3 92.0 89.8 89.6  PLT 127* 123* 111* 109* 108*   Cardiac Enzymes: No results for input(s): "CKTOTAL", "CKMB", "CKMBINDEX", "TROPONINI" in the last 168 hours. BNP (last 3 results) Recent Labs    10/21/22 1214  PROBNP 4,402*   CBG: Recent Labs  Lab 04/11/23 1233 04/11/23 1638 04/11/23 2126 04/12/23 0835 04/12/23 1142  GLUCAP 382* 403* 390* 193* 242*   D-Dimer: No results for input(s): "DDIMER" in the last 72 hours. Hgb A1c: No results for input(s): "HGBA1C" in the last 72 hours.  Lipid Profile: No results for input(s): "CHOL", "HDL", "LDLCALC", "TRIG", "CHOLHDL", "LDLDIRECT" in the last 72 hours.  Thyroid function studies: No results for input(s): "TSH", "T4TOTAL", "T3FREE", "THYROIDAB" in the last 72 hours.  Invalid input(s): "FREET3" Anemia work up: No results for input(s): "VITAMINB12",  "FOLATE", "FERRITIN", "TIBC", "IRON", "RETICCTPCT" in the last 72 hours. Sepsis Labs: Recent Labs  Lab 04/08/23 2148 04/08/23 2247 04/09/23 0343 04/10/23 0525 04/11/23 0412 04/12/23 0423  PROCALCITON 1.03  --   --   --   --   --   WBC  --   --  10.3 3.1* 3.3* 4.3  LATICACIDVEN  --  0.9  --   --   --   --     Microbiology Recent Results (from the past 240 hours)  Resp panel by RT-PCR (RSV, Flu A&B, Covid) Anterior Nasal Swab     Status: Abnormal   Collection Time: 04/08/23  7:20 PM   Specimen: Anterior Nasal Swab  Result Value Ref Range Status   SARS Coronavirus 2 by RT PCR NEGATIVE NEGATIVE Final    Comment: (NOTE) SARS-CoV-2 target nucleic acids are NOT DETECTED.  The SARS-CoV-2 RNA is generally detectable in upper respiratory specimens during the acute phase of infection. The lowest concentration of SARS-CoV-2 viral copies this assay can detect is 138 copies/mL. A negative result does not preclude SARS-Cov-2 infection and should not be used as the sole basis for treatment or other patient management decisions. A negative result may occur with  improper specimen collection/handling, submission of specimen other than nasopharyngeal swab, presence of viral mutation(s) within the areas targeted by this assay, and inadequate number of viral copies(<138 copies/mL). A negative result must be combined with clinical observations, patient history, and epidemiological information. The expected result is Negative.  Fact Sheet for Patients:  BloggerCourse.com  Fact Sheet for Healthcare Providers:  SeriousBroker.it  This test is no t yet approved or cleared by the [place] FDA and  has been authorized for detection and/or diagnosis of SARS-CoV-2 by FDA under an Emergency Use Authorization (EUA). This EUA will remain  in effect (meaning this test can be used) for the duration of the COVID-19 declaration under Section 564(b)(1)  of the Act, 21 U.S.C.section 360bbb-3(b)(1), unless the authorization is terminated  or revoked sooner.       Influenza A by PCR POSITIVE (A) NEGATIVE Final   Influenza B by PCR NEGATIVE NEGATIVE Final    Comment: (NOTE) The Xpert Xpress SARS-CoV-2/FLU/RSV plus assay is intended as an aid in the diagnosis of influenza from Nasopharyngeal swab specimens and should not be used as a sole basis for treatment. Nasal washings and aspirates are unacceptable for Xpert Xpress SARS-CoV-2/FLU/RSV testing.  Fact Sheet for Patients: BloggerCourse.com  Fact Sheet for Healthcare Providers: SeriousBroker.it  This test is not yet approved or cleared by the [place] FDA and has been authorized for detection and/or diagnosis of SARS-CoV-2 by FDA under an Emergency Use Authorization (EUA). This EUA will remain in effect (meaning this test can be used) for the duration of the COVID-19 declaration under Section 564(b)(1) of the Act, 21 U.S.C. section 360bbb-3(b)(1), unless the authorization is terminated or revoked.     Resp Syncytial Virus by PCR NEGATIVE NEGATIVE Final  Comment: (NOTE) Fact Sheet for Patients: BloggerCourse.com  Fact Sheet for Healthcare Providers: SeriousBroker.it  This test is not yet approved or cleared by the [place] FDA and has been authorized for detection and/or diagnosis of SARS-CoV-2 by FDA under an Emergency Use Authorization (EUA). This EUA will remain in effect (meaning this test can be used) for the duration of the COVID-19 declaration under Section 564(b)(1) of the Act, 21 U.S.C. section 360bbb-3(b)(1), unless the authorization is terminated or revoked.  Performed at [facility], [address] [place], [place] [address]   Blood culture (routine x 2)     Status: None (Preliminary result)   Collection Time: 04/08/23 10:31 PM    Specimen: BLOOD  Result Value Ref Range Status   Specimen Description BLOOD RIGHT FOREARM  Final   Special Requests   Final    BOTTLES DRAWN AEROBIC AND ANAEROBIC Blood Culture results may not be optimal due to an inadequate volume of blood received in culture bottles   Culture   Final    NO GROWTH 3 DAYS Performed at [facility], [address] [place], [place] [address]    Report Status PENDING  Incomplete  Blood culture (routine x 2)     Status: None (Preliminary result)   Collection Time: 04/08/23 10:47 PM   Specimen: BLOOD  Result Value Ref Range Status   Specimen Description BLOOD RIGHT ASSIST CONTROL  Final   Special Requests   Final    BOTTLES DRAWN AEROBIC AND ANAEROBIC Blood Culture adequate volume   Culture   Final    NO GROWTH 3 DAYS Performed at [facility], [address], [place], [place] [address]    Report Status PENDING  Incomplete  Expectorated Sputum Assessment w Gram Stain, Rflx to Resp Cult     Status: None   Collection Time: 04/09/23 11:37 AM   Specimen: Sputum  Result Value Ref Range Status   Specimen Description SPUTUM  Final   Special Requests NONE  Final   Sputum evaluation   Final    THIS SPECIMEN IS ACCEPTABLE FOR SPUTUM CULTURE Performed at [facility], [address] [place], [place] [address]    Report Status 04/09/2023 FINAL  Final  Culture, Respiratory w Gram Stain     Status: None   Collection Time: 04/09/23 11:37 AM   Specimen: SPU  Result Value Ref Range Status   Specimen Description   Final    SPUTUM Performed at [facility], [address], [place], [place] [address]    Special Requests   Final    NONE Reflexed from [phone] Performed at [name], [name] [place], [place] [address]    Gram Stain   Final    ABUNDANT WBC PRESENT, PREDOMINANTLY PMN RARE GRAM POSITIVE RODS RARE GRAM POSITIVE COCCI    Culture   Final    FEW Normal respiratory flora-no Staph aureus or  Pseudomonas seen Performed at [facility], [address], [place], [place] [address]    Report Status 04/12/2023 FINAL  Final    Procedures and diagnostic studies:  No results found.              LOS: 4 days   Jolon Degante  [facility]   Pager on [email or web address]. If 7PM-7AM, please contact night-coverage at [email or web address]     04/12/2023, 3:43 PM

## 2023-04-12 NOTE — Plan of Care (Signed)
  Problem: Education: Goal: Ability to describe self-care measures that may prevent or decrease complications (Diabetes Survival Skills Education) will improve Outcome: Progressing Goal: Individualized Educational Video(s) Outcome: Progressing   Problem: Coping: Goal: Ability to adjust to condition or change in health will improve Outcome: Progressing   Problem: Fluid Volume: Goal: Ability to maintain a balanced intake and output will improve Outcome: Progressing   Problem: Health Behavior/Discharge Planning: Goal: Ability to identify and utilize available resources and services will improve Outcome: Progressing Goal: Ability to manage health-related needs will improve Outcome: Progressing   Problem: Metabolic: Goal: Ability to maintain appropriate glucose levels will improve Outcome: Progressing   Problem: Nutritional: Goal: Maintenance of adequate nutrition will improve Outcome: Progressing Goal: Progress toward achieving an optimal weight will improve Outcome: Progressing   Problem: Skin Integrity: Goal: Risk for impaired skin integrity will decrease Outcome: Progressing   Problem: Tissue Perfusion: Goal: Adequacy of tissue perfusion will improve Outcome: Progressing   Problem: Education: Goal: Knowledge of General Education information will improve Description: Including pain rating scale, medication(s)/side effects and non-pharmacologic comfort measures Outcome: Progressing   Problem: Health Behavior/Discharge Planning: Goal: Ability to manage health-related needs will improve Outcome: Progressing   Problem: Clinical Measurements: Goal: Ability to maintain clinical measurements within normal limits will improve Outcome: Progressing Goal: Will remain free from infection Outcome: Progressing Goal: Diagnostic test results will improve Outcome: Progressing Goal: Respiratory complications will improve Outcome: Progressing Goal: Cardiovascular complication will  be avoided Outcome: Progressing   Problem: Activity: Goal: Risk for activity intolerance will decrease Outcome: Progressing   Problem: Nutrition: Goal: Adequate nutrition will be maintained Outcome: Progressing   Problem: Coping: Goal: Level of anxiety will decrease Outcome: Progressing   Problem: Elimination: Goal: Will not experience complications related to bowel motility Outcome: Progressing Goal: Will not experience complications related to urinary retention Outcome: Progressing   Problem: Pain Managment: Goal: General experience of comfort will improve and/or be controlled Outcome: Progressing   Problem: Safety: Goal: Ability to remain free from injury will improve Outcome: Progressing   Problem: Skin Integrity: Goal: Risk for impaired skin integrity will decrease Outcome: Progressing   Problem: Activity: Goal: Ability to tolerate increased activity will improve Outcome: Progressing   Problem: Clinical Measurements: Goal: Ability to maintain a body temperature in the normal range will improve Outcome: Progressing   Problem: Respiratory: Goal: Ability to maintain adequate ventilation will improve Outcome: Progressing Goal: Ability to maintain a clear airway will improve Outcome: Progressing

## 2023-04-12 NOTE — Plan of Care (Signed)
  Problem: Nutritional: Goal: Maintenance of adequate nutrition will improve Outcome: Progressing Goal: Progress toward achieving an optimal weight will improve Outcome: Progressing   Problem: Metabolic: Goal: Ability to maintain appropriate glucose levels will improve Outcome: Progressing   Problem: Clinical Measurements: Goal: Ability to maintain clinical measurements within normal limits will improve Outcome: Progressing Goal: Will remain free from infection Outcome: Progressing Goal: Diagnostic test results will improve Outcome: Progressing Goal: Respiratory complications will improve Outcome: Progressing Goal: Cardiovascular complication will be avoided Outcome: Progressing

## 2023-04-13 DIAGNOSIS — J101 Influenza due to other identified influenza virus with other respiratory manifestations: Secondary | ICD-10-CM | POA: Diagnosis not present

## 2023-04-13 LAB — CBC WITH DIFFERENTIAL/PLATELET
Abs Immature Granulocytes: 0.05 10*3/uL (ref 0.00–0.07)
Basophils Absolute: 0 10*3/uL (ref 0.0–0.1)
Basophils Relative: 0 %
Eosinophils Absolute: 0 10*3/uL (ref 0.0–0.5)
Eosinophils Relative: 0 %
HCT: 27.6 % — ABNORMAL LOW (ref 39.0–52.0)
Hemoglobin: 8.7 g/dL — ABNORMAL LOW (ref 13.0–17.0)
Immature Granulocytes: 2 %
Lymphocytes Relative: 18 %
Lymphs Abs: 0.6 10*3/uL — ABNORMAL LOW (ref 0.7–4.0)
MCH: 28.4 pg (ref 26.0–34.0)
MCHC: 31.5 g/dL (ref 30.0–36.0)
MCV: 90.2 fL (ref 80.0–100.0)
Monocytes Absolute: 0.3 10*3/uL (ref 0.1–1.0)
Monocytes Relative: 7 %
Neutro Abs: 2.5 10*3/uL (ref 1.7–7.7)
Neutrophils Relative %: 73 %
Platelets: 95 10*3/uL — ABNORMAL LOW (ref 150–400)
RBC: 3.06 MIL/uL — ABNORMAL LOW (ref 4.22–5.81)
RDW: 15.4 % (ref 11.5–15.5)
WBC: 3.4 10*3/uL — ABNORMAL LOW (ref 4.0–10.5)
nRBC: 0 % (ref 0.0–0.2)

## 2023-04-13 LAB — BASIC METABOLIC PANEL
Anion gap: 9 (ref 5–15)
BUN: 172 mg/dL — ABNORMAL HIGH (ref 8–23)
CO2: 27 mmol/L (ref 22–32)
Calcium: 8 mg/dL — ABNORMAL LOW (ref 8.9–10.3)
Chloride: 100 mmol/L (ref 98–111)
Creatinine, Ser: 3.3 mg/dL — ABNORMAL HIGH (ref 0.61–1.24)
GFR, Estimated: 20 mL/min — ABNORMAL LOW (ref >60)
Glucose, Bld: 146 mg/dL — ABNORMAL HIGH (ref 70–99)
Potassium: 4.9 mmol/L (ref 3.5–5.1)
Sodium: 136 mmol/L (ref 135–145)

## 2023-04-13 LAB — GLUCOSE, CAPILLARY: Glucose-Capillary: 111 mg/dL — ABNORMAL HIGH (ref 70–99)

## 2023-04-13 NOTE — TOC Transition Note (Signed)
Transition of Care [facility]) - Discharge Note   Patient Details  Name: [name]. MRN: [id number] Date of Birth: [birth year]  Transition of Care [facility]) CM/SW Contact:  Bing Quarry, RN Phone Number: 04/13/2023, 11:06 AM   Clinical Narrative:  1/19: Discharge orders for today to home with [facility] PT orders in. Set up with [facility] by prior CM ,but confirmed with Kandee Keen at [place] via secure message which was acknowledged. Patient declined DME. Patient has PCP. No SDOH alerts noted on this admission.   Patient is [place] EM[employer]OYEE for primary insurance.   Follow up To DO List:  05/02/23 follow with Marca Ancona at [facility]. [phone]  2/27/25Loney Laurence at [facility] at 845 am.  Follow up with PCP within 7-14 days of discharge is recommended for post discharge follow up.   [name], MD     General - Internal Medicine    [phone]    Gabriel Cirri MSN RN CM  RN Case Manager [place]  Transitions of Care Direct Dial: [phone] Jacquelyne Balint Only) [facility] Main Office Phone: [phone] [facility] Fax: [phone] [email or web address]   Final next level of care: Home w Home Health Services Barriers to Discharge: No Barriers Identified   Patient Goals and CMS Choice     Choice offered to / list presented to : Patient      Discharge Placement                       Discharge Plan and Services Additional resources added to the After Visit Summary for                  DME Arranged: N/A DME Agency: NA       HH Arranged: PT HH Agency: [facility] (Orders are in EPIC)        Social Drivers of Health (SDOH) Interventions SDOH Screenings   Food Insecurity: No Food Insecurity (04/11/2023)  Housing: Low Risk  (04/11/2023)  Transportation Needs: No Transportation Needs (04/11/2023)  Utilities: Not At Risk (04/11/2023)  Depression (PHQ2-9): Medium Risk (11/04/2022)  Social Connections: Socially Integrated (04/11/2023)   Tobacco Use: Medium Risk (04/08/2023)     Readmission Risk Interventions    04/09/2023   12:05 PM 06/24/2022    9:39 AM 11/04/2021   11:44 AM  Readmission Risk Prevention Plan  Transportation Screening Complete Complete Complete  HRI or Home Care Consult  --   Social Work Consult for Recovery Care Planning/Counseling  Complete   Palliative Care Screening  Not Applicable   Medication Review Oceanographer) Complete Complete Complete  PCP or Specialist appointment within 3-5 days of discharge Complete  Complete  HRI or Home Care Consult   Complete  SW Recovery Care/Counseling Consult Complete  Complete  Palliative Care Screening Not Applicable  Not Applicable  Skilled Nursing Facility Not Applicable  Not Applicable

## 2023-04-13 NOTE — Discharge Summary (Signed)
Physician Discharge Summary   Patient: [name]. MRN: [id number] DOB: 02-05-1961  Admit date:     04/08/2023  Discharge date: 04/13/23  Discharge Physician: Lurene Shadow   PCP: [name], MD   Recommendations at discharge:   Follow-up with PCP in 1 week  Discharge Diagnoses: Principal Problem:   Influenza A Active Problems:   CAD s/p CABG 2005   Myocardial injury   Chronic HFrEF (heart failure with reduced ejection fraction) (HCC)   Hypertension   Hyperlipidemia   Renal transplant recipient, 2012   Chronic atrial fibrillation (HCC)   CKD (chronic kidney disease) stage 4, GFR 15-29 ml/min (HCC)   OSA (obstructive sleep apnea)   Right rib fracture   BPH (benign prostatic hyperplasia)   Thrombocytopenia (HCC)   Diarrhea   Type II diabetes mellitus with renal manifestations (HCC)   Obesity (BMI 30-39.9)  Resolved Problems:   * No resolved hospital problems. *  Hospital Course:  [name]. is a 63 y.o. male  with medical history significant of s/p of renal transplant on immunosuppressant, CKD-4, HTN, HLD, DM, CAD, CBAG, CHF with with midrange EF 45-50%, gout, depression, ADHD, BPH, former smoker, OSA on BiPAP, obesity, left cephalic vein thrombus and A-fib on Eliquis, who presented to the hospital with shortness of breath.  He said he went on a cruise about a week prior to admission.  He fell while on the cruise and fractured 3 ribs on the right side.  He complained of cough productive of yellowish sputum and shortness of breath that started about 4 days prior to admission.  Also complained of watery stools and pain on the right side of the rib cage.   He tested positive for influenza A infection.  He was found to have bilateral pneumonia and acute hypoxemic and hypercapnic respiratory failure.    Assessment and Plan:  Influenza A infection, community-acquired pneumonia, probably underlying bacterial pneumonia as well, immunocompromised  state: Completed 5 days of Tamiflu and antibiotics (ceftriaxone and azithromycin followed by Augmentin).  No growth on blood and sputum cultures.     Acute hypoxemic and hypercapnic respiratory failure: Improved. Oxygen saturation on room air was 98% on the morning of discharge. He previously required BiPAP on admission and was weaned down to 5 L/min oxygen..  Use BiPAP at night for OSA.     Acute metabolic encephalopathy: Improved     Acute diarrheal illness: No diarrhea since admission.      Elevated troponins: No chest pain.  This is likely from demand ischemia.   Continue Eliquis     Acute on chronic thrombocytopenia: Platelet is low but stable.  Monitor platelet count in the outpatient setting     Right 3rd through 5th rib fractures: Analgesics as needed for pain.  Incentive spirometry as needed.     Type II DM with hyperglycemia: Continue glargine 40 units daily and Mounjaro.      AKI on CKD stage IV, history of renal transplant in 2012: Creatinine is trending downward (3.64-3.41-3.30).  Hopefully, creatinine will continue to trend down.   Restart torsemide to reduce the risk of fluid overload/pulmonary edema. Monitor kidney function as an outpatient. Continue tacrolimus, mycophenolate and steroids.     Comorbidities include CHF with midrange EF (45 to 50%), permanent atrial fibrillation on Eliquis, hypertension, hyperlipidemia, OSA on BiPAP, BPH, rheumatoid arthritis (on prednisone 5 mg long-term), anemia of chronic disease     His condition has improved and he is deemed stable  for discharge to home today.  Discharge plan was discussed with his wife at the bedside.      Consultants: None Procedures performed: None Disposition: Home health Diet recommendation:  Discharge Diet Orders (From admission, onward)     Start     Ordered   04/13/23 0000  Diet - low sodium heart healthy        04/13/23 1017           Cardiac and Carb modified diet DISCHARGE  MEDICATION: Allergies as of 04/13/2023       Reactions   Contrast Media [iodinated Contrast Media] Other (See Comments)   Kidney transplant   Iodine Other (See Comments)   Other reaction(s): Other (See Comments) Kidney transplant   Nsaids Other (See Comments)   Kidney transplant   Ibuprofen Other (See Comments)   Due to kidney transplant Due to kidney transplant Due to kidney transplant        Medication List     STOP taking these medications    lidocaine 5 % Commonly known as: LIDODERM   methylPREDNISolone 4 MG Tbpk tablet Commonly known as: MEDROL DOSEPAK   potassium chloride 10 MEQ CR capsule Commonly known as: MICRO-K       TAKE these medications    albuterol (2.5 MG/3ML) 0.083% nebulizer solution Commonly known as: PROVENTIL Inhale one vial (3 ml) via nebulizer every 6 hours as needed for wheezing or shortness of breath (Take 3 mLs (2.5 mg total) by nebulization every 6 (six) hours as needed for wheezing or shortness of breath.)   albuterol 108 (90 Base) MCG/ACT inhaler Commonly known as: VENTOLIN HFA Inhale 2 puffs into the lungs every 6 (six) hours as needed for wheezing or shortness of breath.   amLODipine 5 MG tablet Commonly known as: NORVASC Take 5 mg by mouth daily.   amoxicillin 500 MG capsule Commonly known as: AMOXIL Take 4 capsules (2,000 mg total) by mouth as directed. Take one hour before dental appointment.   buPROPion 150 MG 12 hr tablet Commonly known as: WELLBUTRIN SR Take 1 tablet (150 mg total) by mouth 2 (two) times daily.   cholecalciferol 25 MCG (1000 UNIT) tablet Commonly known as: VITAMIN D3 Take 1,000 Units by mouth at bedtime.   colchicine 0.6 MG tablet Take 1 tablet (0.6 mg total) by mouth every other day.   Eliquis 5 MG Tabs tablet Generic drug: apixaban Take 1 tablet (5 mg total) by mouth 2 (two) times daily.   Farxiga 10 MG Tabs tablet Generic drug: dapagliflozin propanediol Take 1 tablet (10 mg total) by mouth  daily before breakfast.   febuxostat 40 MG tablet Commonly known as: ULORIC Take 3 tablets (120 mg total) by mouth daily.   fluticasone 50 MCG/ACT nasal spray Commonly known as: FLONASE Place 2 sprays into both nostrils daily.   FreeStyle Libre 3 Sensor Misc Use one every 2 weeks.   gabapentin 300 MG capsule Commonly known as: NEURONTIN Take 1 capsule (300 mg total) by mouth 2 (two) times daily.   HYDROcodone-acetaminophen 10-325 MG tablet Commonly known as: NORCO Take 1 tablet by mouth every 4 (four) hours as needed.   hydrocortisone 2.5 % cream Apply topically 3 (three) times daily as needed.   insulin lispro 100 UNIT/ML KwikPen Commonly known as: HumaLOG KwikPen Inject 10-20 Units into the skin 3 (three) times daily. Per sliding scale   losartan 25 MG tablet Commonly known as: COZAAR Take by mouth.   montelukast 10 MG tablet Commonly known as:  SINGULAIR Take 1 tablet (10 mg total) by mouth at bedtime.   Mounjaro 5 MG/0.5ML Pen Generic drug: tirzepatide Inject 5 mg into the skin once a week.   multivitamin with minerals Tabs tablet Take 1 tablet by mouth daily.   mycophenolate 180 MG EC tablet Commonly known as: MYFORTIC Take 2 tablets (360 mg total) by mouth 2 (two) times daily.   omeprazole 20 MG capsule Commonly known as: PRILOSEC Take 1 capsule (20 mg total) by mouth daily.   potassium chloride SA 20 MEQ tablet Commonly known as: KLOR-CON M Take 3 tablets (60 mEq total) by mouth 2 (two) times daily.   predniSONE 5 MG tablet Commonly known as: DELTASONE Take 1 tablet (5 mg total) by mouth daily. What changed: Another medication with the same name was removed. Continue taking this medication, and follow the directions you see here.   rosuvastatin 10 MG tablet Commonly known as: CRESTOR Take 1 tablet (10 mg total) by mouth daily.   SEMGLEE [place] Inject 40 Units into the skin daily.   sulfamethoxazole-trimethoprim 400-80 MG tablet Commonly known as:  BACTRIM Take 1 tablet by mouth 3 (three) times a week.   tacrolimus 1 MG capsule Commonly known as: PROGRAF Take 2 capsules (2 mg total) by mouth every morning AND 1 capsule (1 mg total) every evening. What changed: Another medication with the same name was removed. Continue taking this medication, and follow the directions you see here.   tamsulosin 0.4 MG Caps capsule Commonly known as: FLOMAX Take 1 capsule (0.4 mg total) by mouth daily.   Testosterone 20.25 MG/ACT (1.62%) Gel Place 2 Pump onto the skin daily.   torsemide 20 MG tablet Commonly known as: DEMADEX Take 4 tablets (80 mg total) by mouth 2 (two) times daily.   traMADol 50 MG tablet Commonly known as: ULTRAM Take 1 tablet (50 mg total) by mouth 3 (three) times daily as needed.   Unifine Pentips 31G X 5 MM Misc Generic drug: Insulin Pen Needle Use 4 times daily as directed   Insupen Pen Needles 31G X 5 MM Misc Generic drug: Insulin Pen Needle 4 (four) times daily.   TechLite Pen Needles 32G X 6 MM Misc Generic drug: Insulin Pen Needle Use 4 times a day   zinc sulfate (50mg  elemental zinc) 220 (50 Zn) MG capsule Take 220 mg by mouth daily.               Durable Medical Equipment  (From admission, onward)           Start     Ordered   04/11/23 0000  For home use only DME Walker rolling       Question Answer Comment  Walker: With 5 Inch Wheels   Patient needs a walker to treat with the following condition General weakness      04/11/23 1152            Discharge Exam: Filed Weights   04/08/23 1917 04/12/23 1047 04/13/23 0500  Weight: 111.1 kg 116.2 kg 118.4 kg   GEN: NAD SKIN: Warm and dry EYES: No pallor or icterus ENT: MMM CV: RRR PULM: No wheezing or rales heard ABD: soft, obese, NT, +BS CNS: AAO x 3, non focal EXT: No edema or tenderness   Condition at discharge: good  The results of significant diagnostics from this hospitalization (including imaging, microbiology,  ancillary and laboratory) are listed below for reference.   Imaging Studies: CT CHEST ABDOMEN PELVIS WO CONTRAST Result Date:  04/08/2023 CLINICAL DATA:  Fall, right rib fractures, shortness of breath, cough EXAM: CT CHEST, ABDOMEN AND PELVIS WITHOUT CONTRAST TECHNIQUE: Multidetector CT imaging of the chest, abdomen and pelvis was performed following the standard protocol without IV contrast. RADIATION DOSE REDUCTION: This exam was performed according to the departmental dose-optimization program which includes automated exposure control, adjustment of the mA and/or kV according to patient size and/or use of iterative reconstruction technique. COMPARISON:  Chest radiograph dated 04/08/2023. CT chest abdomen pelvis dated 06/21/2022. FINDINGS: CT CHEST FINDINGS Cardiovascular: Mild cardiomegaly.  No pericardial effusion. No evidence of thoracic aortic aneurysm. Atherosclerotic calcifications of the aortic arch. Severe three-vessel coronary atherosclerosis. Mediastinum/Nodes: Calcified mediastinal nodes, benign. Visualized thyroid is unremarkable. Lungs/Pleura: Patchy/nodular left upper lobe opacities, suggesting pneumonia. Evaluation lung parenchyma is constrained by respiratory motion. Within that constraint, there are no suspicious pulmonary nodules. No pleural effusion or pneumothorax. Musculoskeletal: Degenerative changes of the mid/lower thoracic spine. Median sternotomy. Nondisplaced right posterior 3rd through 5th rib fractures. CT ABDOMEN PELVIS FINDINGS Hepatobiliary: Unenhanced liver is grossly unremarkable. Status post cholecystectomy. No intrahepatic or extrahepatic dilatation but Pancreas: Within normal limits. Spleen: At the mL limits of normal for size. Adrenals/Urinary Tract: Adrenal glands are within normal limits. Bilateral native renal atrophy with bilateral renal vascular calcifications. No hydronephrosis. Right lower quadrant renal transplant, without hydronephrosis. Mildly thick-walled bladder  with perivesical stranding (series 2/image 117). Stomach/Bowel: Postsurgical changes related to partial gastrectomy. No evidence of bowel obstruction. Appendix is not discretely visualized. No colonic wall thickening or inflammatory changes. Vascular/Lymphatic: No evidence of abdominal aortic aneurysm. Atherosclerotic calcifications of the abdominal aorta and branch vessels. No suspicious abdominopelvic lymphadenopathy. Reproductive: Prostate is notable for postsurgical changes related to prior urolift procedure. Other: No abdominopelvic ascites. Mild postsurgical changes along the right anterior abdominal wall. Musculoskeletal: Mild degenerative changes of the lumbar spine. No fracture is seen. IMPRESSION: Nondisplaced right posterior 3rd through 5th rib fractures. No pneumothorax. Suspected left upper lobe pneumonia. Mildly thick-walled bladder with perivesical stranding, correlate for cystitis. Additional ancillary findings as above. Aortic Atherosclerosis (ICD10-I70.0). Electronically Signed   By: Charline Bills M.D.   On: 04/08/2023 21:59   CT HEAD WO CONTRAST ( ) Result Date: 04/08/2023 CLINICAL DATA:  Head trauma, abnormal mental status (Age 81-64y); Ataxia, cervical trauma EXAM: CT HEAD WITHOUT CONTRAST CT CERVICAL SPINE WITHOUT CONTRAST TECHNIQUE: Multidetector CT imaging of the head and cervical spine was performed following the standard protocol without intravenous contrast. Multiplanar CT image reconstructions of the cervical spine were also generated. RADIATION DOSE REDUCTION: This exam was performed according to the departmental dose-optimization program which includes automated exposure control, adjustment of the mA and/or kV according to patient size and/or use of iterative reconstruction technique. COMPARISON:  None Available. FINDINGS: CT HEAD FINDINGS Brain: No hemorrhage. No hydrocephalus. No extra-axial fluid collection. No mass effect. No mass lesion. No CT evidence of an acute cortical  infarct. Vascular: No hyperdense vessel or unexpected calcification. Skull: Normal. Negative for fracture or focal lesion. Sinuses/Orbits: No middle ear or mastoid effusion. Paranasal sinuses are notable for mucosal thickening in the left ethmoid sinus. Bilateral lens replacement. Orbits are otherwise unremarkable. Other: None. CT CERVICAL SPINE FINDINGS Alignment: Normal. Skull base and vertebrae: No acute fracture. No primary bone lesion or focal pathologic process. Soft tissues and spinal canal: No prevertebral fluid or swelling. No visible canal hematoma. Disc levels: Calcified disc bulge C3-C4 results in mild-to-moderate spinal canal narrowing. Upper chest: Negative. Other: None IMPRESSION: 1. No acute intracranial abnormality. 2. No acute fracture or  traumatic subluxation of the cervical spine. 3. Calcified disc bulge C3-C4 results in mild-to-moderate spinal canal narrowing. Electronically Signed   By: Lorenza Cambridge M.D.   On: 04/08/2023 21:42   CT Cervical Spine Wo Contrast Result Date: 04/08/2023 CLINICAL DATA:  Head trauma, abnormal mental status (Age 17-64y); Ataxia, cervical trauma EXAM: CT HEAD WITHOUT CONTRAST CT CERVICAL SPINE WITHOUT CONTRAST TECHNIQUE: Multidetector CT imaging of the head and cervical spine was performed following the standard protocol without intravenous contrast. Multiplanar CT image reconstructions of the cervical spine were also generated. RADIATION DOSE REDUCTION: This exam was performed according to the departmental dose-optimization program which includes automated exposure control, adjustment of the mA and/or kV according to patient size and/or use of iterative reconstruction technique. COMPARISON:  None Available. FINDINGS: CT HEAD FINDINGS Brain: No hemorrhage. No hydrocephalus. No extra-axial fluid collection. No mass effect. No mass lesion. No CT evidence of an acute cortical infarct. Vascular: No hyperdense vessel or unexpected calcification. Skull: Normal. Negative  for fracture or focal lesion. Sinuses/Orbits: No middle ear or mastoid effusion. Paranasal sinuses are notable for mucosal thickening in the left ethmoid sinus. Bilateral lens replacement. Orbits are otherwise unremarkable. Other: None. CT CERVICAL SPINE FINDINGS Alignment: Normal. Skull base and vertebrae: No acute fracture. No primary bone lesion or focal pathologic process. Soft tissues and spinal canal: No prevertebral fluid or swelling. No visible canal hematoma. Disc levels: Calcified disc bulge C3-C4 results in mild-to-moderate spinal canal narrowing. Upper chest: Negative. Other: None IMPRESSION: 1. No acute intracranial abnormality. 2. No acute fracture or traumatic subluxation of the cervical spine. 3. Calcified disc bulge C3-C4 results in mild-to-moderate spinal canal narrowing. Electronically Signed   By: Lorenza Cambridge M.D.   On: 04/08/2023 21:42   DG Chest 2 View Result Date: 04/08/2023 CLINICAL DATA:  Cough EXAM: CHEST - 2 VIEW COMPARISON:  04/07/2023, CT chest 07/07/2022 FINDINGS: Post sternotomy changes. Rounded right paratracheal calcification consistent with node is unchanged. Cardiomegaly with central congestion. No pleural effusion or pneumothorax. Streaky bilateral perihilar opacities. IMPRESSION: Cardiomegaly with central congestion. Streaky bilateral perihilar opacities question atypical infection Electronically Signed   By: Jasmine Pang M.D.   On: 04/08/2023 20:38   DG Chest 2 View Result Date: 04/07/2023 CLINICAL DATA:  Rib fractures, pneumothorax EXAM: CHEST - 2 VIEW COMPARISON:  11/01/2022, CT 07/07/2022 FINDINGS: Cardiomegaly, prior CABG. Calcified right paratracheal lymph node again noted as seen on prior CT. No confluent airspace opacities, effusions or edema. No acute bony abnormality. IMPRESSION: Cardiomegaly.  No active disease. Electronically Signed   By: Charlett Nose M.D.   On: 04/07/2023 16:48   ECHOCARDIOGRAM COMPLETE Result Date: 03/25/2023    ECHOCARDIOGRAM REPORT    Patient Name:   Sokha Whinery. Date of Exam: 03/25/2023 Medical Rec #:  [id number]               Height:       70.0 in Accession #:    [id number]              Weight:       254.9 lb Date of Birth:  01/12/1961               BSA:          2.314 m Patient Age:    62 years                BP:           142/72 mmHg Patient Gender: M  HR:           56 bpm. Exam Location:  [facility] Procedure: 2D Echo, Cardiac Doppler, Color Doppler and Intracardiac            Opacification Agent Indications:     Congestive heart failure I50.9                  Chronic diastolic heart failure I50.32  History:         Patient has prior history of Echocardiogram examinations, most                  recent 04/02/2022. CAD; Prior CABG.  Sonographer:     Cristela Blue Referring Phys:  [id number] Laurey Morale Diagnosing Phys: Debbe Odea MD IMPRESSIONS  1. Left ventricular ejection fraction, by estimation, is 45 to 50%. Left ventricular ejection fraction by PLAX is 48 %. The left ventricle has mildly decreased function. The left ventricle demonstrates global hypokinesis. Left ventricular diastolic parameters are indeterminate.  2. Right ventricular systolic function is low normal. The right ventricular size is not well visualized.  3. Left atrial size was severely dilated.  4. The mitral valve is degenerative. Mild mitral valve regurgitation.  5. The aortic valve was not well visualized. Aortic valve regurgitation is not visualized. Aortic valve sclerosis/calcification is present, without any evidence of aortic stenosis. Aortic valve mean gradient measures 8.3 mmHg. FINDINGS  Left Ventricle: Left ventricular ejection fraction, by estimation, is 45 to 50%. Left ventricular ejection fraction by PLAX is 48 %. The left ventricle has mildly decreased function. The left ventricle demonstrates global hypokinesis. Definity contrast agent was given IV to delineate the left ventricular endocardial borders. The left ventricular internal  cavity size was normal in size. Suboptimal image quality limits for assessment of left ventricular hypertrophy. Left ventricular diastolic parameters are indeterminate. Right Ventricle: The right ventricular size is not well visualized. No increase in right ventricular wall thickness. Right ventricular systolic function is low normal. Left Atrium: Left atrial size was severely dilated. Right Atrium: Right atrial size was normal in size. Pericardium: There is no evidence of pericardial effusion. Mitral Valve: The mitral valve is degenerative in appearance. Mild to moderate mitral annular calcification. Mild mitral valve regurgitation. Tricuspid Valve: The tricuspid valve is normal in structure. Tricuspid valve regurgitation is mild. Aortic Valve: The aortic valve was not well visualized. Aortic valve regurgitation is not visualized. Aortic valve sclerosis/calcification is present, without any evidence of aortic stenosis. Aortic valve mean gradient measures 8.3 mmHg. Aortic valve peak gradient measures 14.2 mmHg. Aortic valve area, by VTI measures 2.09 cm. Pulmonic Valve: The pulmonic valve was normal in structure. Pulmonic valve regurgitation is not visualized. Aorta: The aortic root is normal in size and structure. Venous: The inferior vena cava was not well visualized. IAS/Shunts: No atrial level shunt detected by color flow Doppler.  LEFT VENTRICLE PLAX 2D LV EF:         Left ventricular ejection fraction by PLAX is 48 %. LVIDd:         6.10 cm LVIDs:         4.60 cm LV PW:         1.50 cm LV IVS:        1.30 cm LVOT diam:     2.30 cm LV SV:         89 LV SV Index:   38 LVOT Area:     4.15 cm  RIGHT VENTRICLE RV Basal diam:  4.20  cm RV Mid diam:    3.90 cm LEFT ATRIUM            Index        RIGHT ATRIUM           Index LA diam:      5.90 cm  2.55 cm/m   RA Area:     18.10 cm LA Vol (A2C): 62.1 ml  26.84 ml/m  RA Volume:   44.90 ml  19.40 ml/m LA Vol (A4C): 121.0 ml 52.29 ml/m  AORTIC VALVE AV Area (Vmax):     2.01 cm AV Area (Vmean):   1.98 cm AV Area (VTI):     2.09 cm AV Vmax:           188.33 cm/s AV Vmean:          133.333 cm/s AV VTI:            0.426 m AV Peak Grad:      14.2 mmHg AV Mean Grad:      8.3 mmHg LVOT Vmax:         91.00 cm/s LVOT Vmean:        63.500 cm/s LVOT VTI:          0.214 m LVOT/AV VTI ratio: 0.50  AORTA Ao Root diam: 3.30 cm MITRAL VALVE                TRICUSPID VALVE MV Area (PHT): 3.79 cm     TR Peak grad:   12.7 mmHg MV Decel Time: 200 msec     TR Vmax:        178.00 cm/s MV E velocity: 145.00 cm/s                             SHUNTS                             Systemic VTI:  0.21 m                             Systemic Diam: 2.30 cm Debbe Odea MD Electronically signed by Debbe Odea MD Signature Date/Time: 03/25/2023/5:21:09 PM    Final     Microbiology: Results for orders placed or performed during the hospital encounter of 04/08/23  Resp panel by RT-PCR (RSV, Flu A&B, Covid) Anterior Nasal Swab     Status: Abnormal   Collection Time: 04/08/23  7:20 PM   Specimen: Anterior Nasal Swab  Result Value Ref Range Status   SARS Coronavirus 2 by RT PCR NEGATIVE NEGATIVE Final    Comment: (NOTE) SARS-CoV-2 target nucleic acids are NOT DETECTED.  The SARS-CoV-2 RNA is generally detectable in upper respiratory specimens during the acute phase of infection. The lowest concentration of SARS-CoV-2 viral copies this assay can detect is 138 copies/mL. A negative result does not preclude SARS-Cov-2 infection and should not be used as the sole basis for treatment or other patient management decisions. A negative result may occur with  improper specimen collection/handling, submission of specimen other than nasopharyngeal swab, presence of viral mutation(s) within the areas targeted by this assay, and inadequate number of viral copies(<138 copies/mL). A negative result must be combined with clinical observations, patient history, and epidemiological information. The  expected result is Negative.  Fact Sheet for Patients:  BloggerCourse.com  Fact Sheet for Healthcare Providers:  SeriousBroker.it  This test is no t yet approved or cleared by the [place] and  has been authorized for detection and/or diagnosis of SARS-CoV-2 by FDA under an Emergency Use Authorization (EUA). This EUA will remain  in effect (meaning this test can be used) for the duration of the COVID-19 declaration under Section 564(b)(1) of the Act, 21 U.S.C.section 360bbb-3(b)(1), unless the authorization is terminated  or revoked sooner.       Influenza A by PCR POSITIVE (A) NEGATIVE Final   Influenza B by PCR NEGATIVE NEGATIVE Final    Comment: (NOTE) The Xpert Xpress SARS-CoV-2/FLU/RSV plus assay is intended as an aid in the diagnosis of influenza from Nasopharyngeal swab specimens and should not be used as a sole basis for treatment. Nasal washings and aspirates are unacceptable for Xpert Xpress SARS-CoV-2/FLU/RSV testing.  Fact Sheet for Patients: BloggerCourse.com  Fact Sheet for Healthcare Providers: SeriousBroker.it  This test is not yet approved or cleared by the [place] FDA and has been authorized for detection and/or diagnosis of SARS-CoV-2 by FDA under an Emergency Use Authorization (EUA). This EUA will remain in effect (meaning this test can be used) for the duration of the COVID-19 declaration under Section 564(b)(1) of the Act, 21 U.S.C. section 360bbb-3(b)(1), unless the authorization is terminated or revoked.     Resp Syncytial Virus by PCR NEGATIVE NEGATIVE Final    Comment: (NOTE) Fact Sheet for Patients: BloggerCourse.com  Fact Sheet for Healthcare Providers: SeriousBroker.it  This test is not yet approved or cleared by the [place] FDA and has been authorized for detection and/or  diagnosis of SARS-CoV-2 by FDA under an Emergency Use Authorization (EUA). This EUA will remain in effect (meaning this test can be used) for the duration of the COVID-19 declaration under Section 564(b)(1) of the Act, 21 U.S.C. section 360bbb-3(b)(1), unless the authorization is terminated or revoked.  Performed at [facility], [address] [place], [place] [address]   Blood culture (routine x 2)     Status: None (Preliminary result)   Collection Time: 04/08/23 10:31 PM   Specimen: BLOOD  Result Value Ref Range Status   Specimen Description BLOOD RIGHT FOREARM  Final   Special Requests   Final    BOTTLES DRAWN AEROBIC AND ANAEROBIC Blood Culture results may not be optimal due to an inadequate volume of blood received in culture bottles   Culture   Final    NO GROWTH 4 DAYS Performed at [facility], [address] [place], [place] [address]    Report Status PENDING  Incomplete  Blood culture (routine x 2)     Status: None (Preliminary result)   Collection Time: 04/08/23 10:47 PM   Specimen: BLOOD  Result Value Ref Range Status   Specimen Description BLOOD RIGHT ASSIST CONTROL  Final   Special Requests   Final    BOTTLES DRAWN AEROBIC AND ANAEROBIC Blood Culture adequate volume   Culture   Final    NO GROWTH 4 DAYS Performed at [facility], [address] [place], [place] [address]    Report Status PENDING  Incomplete  Expectorated Sputum Assessment w Gram Stain, Rflx to Resp Cult     Status: None   Collection Time: 04/09/23 11:37 AM   Specimen: Sputum  Result Value Ref Range Status   Specimen Description SPUTUM  Final   Special Requests NONE  Final   Sputum evaluation   Final    THIS SPECIMEN IS ACCEPTABLE FOR SPUTUM CULTURE  Performed at [facility], [address] [place], [place] [address]    Report Status 04/09/2023 FINAL  Final  Culture, Respiratory w Gram Stain     Status: None   Collection Time: 04/09/23 11:37 AM    Specimen: SPU  Result Value Ref Range Status   Specimen Description   Final    SPUTUM Performed at [facility], [address] [place], [place] [address]    Special Requests   Final    NONE Reflexed from [id number] Performed at [facility], [address] [place], [place] [address]    Gram Stain   Final    ABUNDANT WBC PRESENT, PREDOMINANTLY PMN RARE GRAM POSITIVE RODS RARE GRAM POSITIVE COCCI    Culture   Final    FEW Normal respiratory flora-no Staph aureus or Pseudomonas seen Performed at [facility], [address] [place], [place] [address]    Report Status 04/12/2023 FINAL  Final   *Note: Due to a large number of results and/or encounters for the requested time period, some results have not been displayed. A complete set of results can be found in Results Review.    Labs: CBC: Recent Labs  Lab 04/09/23 0343 04/10/23 0525 04/11/23 0412 04/12/23 0423 04/13/23 0413  WBC 10.3 3.1* 3.3* 4.3 3.4*  NEUTROABS  --   --  2.8 3.7 2.5  HGB 10.8* 9.9* 9.8* 9.2* 8.7*  HCT 34.6* 32.2* 30.7* 29.2* 27.6*  MCV 90.3 92.0 89.8 89.6 90.2  PLT 123* 111* 109* 108* 95*   Basic Metabolic Panel: Recent Labs  Lab 04/09/23 0343 04/10/23 0525 04/11/23 0412 04/11/23 1724 04/12/23 0423 04/13/23 0413  NA 138 134* 133*  --  137 136  K 4.7 4.9 4.7  --  4.9 4.9  CL 104 101 96*  --  102 100  CO2 22 21* 23  --  25 27  GLUCOSE 121* 312* 362* 418* 224* 146*  BUN 93* 112* 125*  --  145* 172*  CREATININE 2.80* 3.44* 3.64*  --  3.41* 3.30*  CALCIUM 8.4* 7.6* 7.8*  --  8.0* 8.0*   Liver Function Tests: No results for input(s): "AST", "ALT", "ALKPHOS", "BILITOT", "PROT", "ALBUMIN" in the last 168 hours. CBG: Recent Labs  Lab 04/12/23 0835 04/12/23 1142 04/12/23 1617 04/12/23 2145 04/13/23 0824  GLUCAP 193* 242* 271* 250* 111*    Discharge time spent: greater than 30 minutes.  Signed: Lurene Shadow, MD [facility] 04/13/2023

## 2023-04-13 NOTE — Progress Notes (Signed)
AVS gone over with pt and his wife. All questions answered. Pt taken to exit via wheelchair. Pt left in a private vehicle with his wife.

## 2023-04-14 LAB — CULTURE, BLOOD (ROUTINE X 2)
Culture: NO GROWTH
Culture: NO GROWTH
Special Requests: ADEQUATE

## 2023-04-19 ENCOUNTER — Other Ambulatory Visit: Payer: Self-pay

## 2023-04-19 ENCOUNTER — Other Ambulatory Visit: Payer: Self-pay | Admitting: Internal Medicine

## 2023-04-19 MED FILL — Montelukast Sodium Tab 10 MG (Base Equiv): ORAL | 30 days supply | Qty: 30 | Fill #3 | Status: AC

## 2023-04-19 MED FILL — Omeprazole Cap Delayed Release 20 MG: ORAL | 30 days supply | Qty: 30 | Fill #3 | Status: AC

## 2023-04-20 ENCOUNTER — Other Ambulatory Visit: Payer: Self-pay

## 2023-04-20 ENCOUNTER — Other Ambulatory Visit: Payer: Self-pay | Admitting: Internal Medicine

## 2023-04-21 ENCOUNTER — Other Ambulatory Visit: Payer: Self-pay

## 2023-04-21 ENCOUNTER — Encounter: Payer: Self-pay | Admitting: Internal Medicine

## 2023-04-21 ENCOUNTER — Ambulatory Visit (INDEPENDENT_AMBULATORY_CARE_PROVIDER_SITE_OTHER): Payer: Commercial Managed Care - PPO | Admitting: Internal Medicine

## 2023-04-21 ENCOUNTER — Encounter: Payer: Self-pay | Admitting: Oncology

## 2023-04-21 VITALS — BP 110/60 | HR 66 | Temp 97.9°F | Ht 70.0 in | Wt 249.0 lb

## 2023-04-21 DIAGNOSIS — I5032 Chronic diastolic (congestive) heart failure: Secondary | ICD-10-CM

## 2023-04-21 DIAGNOSIS — N184 Chronic kidney disease, stage 4 (severe): Secondary | ICD-10-CM | POA: Diagnosis not present

## 2023-04-21 DIAGNOSIS — J181 Lobar pneumonia, unspecified organism: Secondary | ICD-10-CM | POA: Insufficient documentation

## 2023-04-21 MED ORDER — MYCOPHENOLATE SODIUM 180 MG PO TBEC
DELAYED_RELEASE_TABLET | ORAL | 3 refills | Status: DC
  Filled 2023-04-21 – 2023-06-19 (×3): qty 270, 90d supply, fill #0
  Filled 2023-12-31: qty 270, 90d supply, fill #1

## 2023-04-21 MED ORDER — LOSARTAN POTASSIUM 25 MG PO TABS
25.0000 mg | ORAL_TABLET | Freq: Every day | ORAL | 3 refills | Status: DC
  Filled 2023-04-21: qty 90, 90d supply, fill #0
  Filled 2023-07-23: qty 90, 90d supply, fill #1
  Filled 2023-10-29: qty 90, 90d supply, fill #2

## 2023-04-21 MED ORDER — ZINC 50 MG PO TABS
50.0000 mg | ORAL_TABLET | Freq: Every day | ORAL | 3 refills | Status: DC
  Filled 2023-04-21 (×2): qty 100, 100d supply, fill #0
  Filled 2023-09-09 – 2023-11-16 (×5): qty 100, 100d supply, fill #1
  Filled 2023-11-20: qty 90, 90d supply, fill #1
  Filled 2024-03-11: qty 90, 90d supply, fill #2

## 2023-04-21 MED FILL — Tramadol HCl Tab 50 MG: ORAL | 10 days supply | Qty: 30 | Fill #0 | Status: AC

## 2023-04-21 NOTE — Assessment & Plan Note (Signed)
Did worsen in the hospital--then improved Will have transplant follow up with Dr Yetta [name] soon

## 2023-04-21 NOTE — Assessment & Plan Note (Signed)
Only seen on CT scan Could have been the flu--but did have empiric Rx with ceftiaxone/azithromycin --then augmentin outpatient This is better No new Rx

## 2023-04-21 NOTE — Telephone Encounter (Signed)
Last filled 04-07-23 #30 Last OV 04-07-23 Next OV today (04-21-23) [facility]

## 2023-04-21 NOTE — Progress Notes (Signed)
Subjective:    Patient ID: [name]., male    DOB: [birth year], 63 y.o.   MRN: [id number]  HPI Here for hospital follow up  Went to ER the day after my visit---got sick Got really tired and could barely open his eyes Family took him to ER Admitted 1/14 with positive flu and hypoxic resp failure Probable LUL pneumonia on CT scan (chest x-ray just showed streaky perihilar opacities suspicious for atypical infection) Did have worsening of CKD while there---creatinine up to 3.6 and then stabilized (last 3.3)  Got tamiflu and ceftriazone/azithromycin Sent home on augmentin--discharged 1/19  Still feeling weak Dizziness today--but still able to drive Finished the antibiotics Some SOB with exertion only Not sleeping well--lots of cough and ribs still hurting---does have the CPAP at home  Current Outpatient Medications on File Prior to Visit  Medication Sig Dispense Refill   albuterol (PROVENTIL) (2.5 MG/3ML) 0.083% nebulizer solution Take 3 mLs (2.5 mg total) by nebulization every 6 (six) hours as needed for wheezing or shortness of breath. 150 mL 0   albuterol (VENTOLIN HFA) 108 (90 Base) MCG/ACT inhaler Inhale 2 puffs into the lungs every 6 (six) hours as needed for wheezing or shortness of breath. 6.7 g 5   amLODipine (NORVASC) 5 MG tablet Take 5 mg by mouth daily.     amoxicillin (AMOXIL) 500 MG capsule Take 4 capsules (2,000 mg total) by mouth as directed. Take one hour before dental appointment. 4 capsule 5   apixaban (ELIQUIS) 5 MG TABS tablet Take 1 tablet (5 mg total) by mouth 2 (two) times daily. 180 tablet 3   buPROPion (WELLBUTRIN SR) 150 MG 12 hr tablet Take 1 tablet (150 mg total) by mouth 2 (two) times daily. 180 tablet 3   cholecalciferol (VITAMIN D3) 25 MCG (1000 UNIT) tablet Take 1,000 Units by mouth at bedtime.     colchicine 0.6 MG tablet Take 1 tablet (0.6 mg total) by mouth every other day. 45 tablet 1   Continuous Glucose Sensor (FREESTYLE LIBRE 3  SENSOR) MISC Use one every 2 weeks. 6 each 3   dapagliflozin propanediol (FARXIGA) 10 MG TABS tablet Take 1 tablet (10 mg total) by mouth daily before breakfast. 90 tablet 3   febuxostat (ULORIC) 40 MG tablet Take 3 tablets (120 mg total) by mouth daily. 90 tablet 5   fluticasone (FLONASE) 50 MCG/ACT nasal spray Place 2 sprays into both nostrils daily. 16 g 11   gabapentin (NEURONTIN) 300 MG capsule Take 1 capsule (300 mg total) by mouth 2 (two) times daily. 180 capsule 0   HYDROcodone-acetaminophen (NORCO) 10-325 MG tablet Take 1 tablet by mouth every 4 (four) hours as needed.     hydrocortisone 2.5 % cream Apply topically 3 (three) times daily as needed. 30 g 3   Insulin Glargine (SEMGLEE Eldred) Inject 40 Units into the skin daily.     insulin lispro (HUMALOG KWIKPEN) 100 UNIT/ML KwikPen Inject 10-20 Units into the skin 3 (three) times daily. Per sliding scale     Insulin Pen Needle (TECHLITE PEN NEEDLES) 31G X 5 MM MISC 4 (four) times daily. 400 each 3   Insulin Pen Needle (TECHLITE PEN NEEDLES) 32G X 6 MM MISC Use 4 times a day 100 each 3   Insulin Pen Needle (UNIFINE PENTIPS) 31G X 5 MM MISC Use 4 times daily as directed 400 each 3   losartan (COZAAR) 25 MG tablet Take 1 tablet (25 mg total) by mouth daily. 90 tablet 3  montelukast (SINGULAIR) 10 MG tablet Take 1 tablet (10 mg total) by mouth at bedtime. 30 tablet 11   Multiple Vitamin (MULTIVITAMIN WITH MINERALS) TABS tablet Take 1 tablet by mouth daily.     omeprazole (PRILOSEC) 20 MG capsule Take 1 capsule (20 mg total) by mouth daily. 30 capsule 11   potassium chloride SA (KLOR-CON M) 20 MEQ tablet Take 3 tablets (60 mEq total) by mouth 2 (two) times daily. 180 tablet 1   predniSONE (DELTASONE) 5 MG tablet Take 1 tablet (5 mg total) by mouth daily. 90 tablet 3   rosuvastatin (CRESTOR) 10 MG tablet Take 1 tablet (10 mg total) by mouth daily. 90 tablet 3   sulfamethoxazole-trimethoprim (BACTRIM) 400-80 MG tablet Take 1 tablet by mouth 3  (three) times a week. 36 tablet 3   tacrolimus (PROGRAF) 1 MG capsule Take 2 capsules (2 mg total) by mouth every morning AND 1 capsule (1 mg total) every evening. 90 capsule 11   tamsulosin (FLOMAX) 0.4 MG CAPS capsule Take 1 capsule (0.4 mg total) by mouth daily. 30 capsule 11   Testosterone 20.25 MG/ACT (1.62%) GEL Place 2 Pump onto the skin daily. 150 g 3   tirzepatide (MOUNJARO) 5 MG/0.5ML Pen Inject 5 mg into the skin once a week. 2 mL 5   torsemide (DEMADEX) 20 MG tablet Take 4 tablets (80 mg total) by mouth 2 (two) times daily.     traMADol (ULTRAM) 50 MG tablet Take 1 tablet (50 mg total) by mouth 3 (three) times daily as needed. 30 tablet 0   zinc sulfate, 50mg  elemental zinc, 220 (50 Zn) MG capsule Take 1 capsule (220 mg total) by mouth daily. 90 capsule 3   No current facility-administered medications on file prior to visit.    Allergies  Allergen Reactions   Contrast Media [Iodinated Contrast Media] Other (See Comments)    Kidney transplant   Iodine Other (See Comments)    Other reaction(s): Other (See Comments) Kidney transplant   Nsaids Other (See Comments)    Kidney transplant   Ibuprofen Other (See Comments)    Due to kidney transplant Due to kidney transplant Due to kidney transplant    Past Medical History:  Diagnosis Date   Anal condyloma    Anemia    Aortic atherosclerosis (HCC)    Aortic stenosis    a.) TTE 10/2021: Mod AS. AVA 1.06cm^2 (VTI). Mean grad ; b.) TTE 04/02/2022: no AV stenosis   Asthma, persistent controlled 02/25/2013   Attention or concentration deficit 04/26/2021   BPH with obstruction/lower urinary tract symptoms 09/25/2014   CAD (coronary artery disease) 2005   a.) LHC 1996 -> HG stenosis mLAD -> PTCA/PCI with stent x 1 (unk type) -> complicated by CFA pseudoanurysm (required surg);  b.) LHC 08/10/2002  -> 95% LAD, 70% ISR LAD, 80% pOM, 70% mRCA --> CVTS consult; c.) 4v CABG 08/13/2002; c.) 03/2018 MV: Small, fixed inferoapical and  apical sep defect. No isc. EF 47% (60-65% by 05/2018 TTE); d.) 02/2021 MV: small Apical inf fixed defect. No isc -> low risk.   Cardiomegaly    Cellulitis and abscess of left leg 07/22/2020   Chronic atrial fibrillation    a.) CHA2DS2VASc = 4 (CHF, HTN, vascular disease history, T2DM);  b.) rate/rhythm maintained without pharmacological intervention; chronically anticoagulated with apixaban   Chronic combined systolic and diastolic CHF (congestive heart failure)    a.) TTE 7/18 : EF 45-50%; b.) TTE 05/2018 : EF 60-65%, RVSP 74.8; c.) TTE 12/2018:  EF 50-55%. Sev dil LA; d.) TTE 04/2019: EF 45-50%; e.) TTE 07/2021: EF 40-45%, G3DD; f.) TTE 10/2021: EF 40-45%, mild conc LVH, septal-lat dyssynchrony (LBBB), mildly red RVSF, mild-mod dil LA, mildly dil RA, mild-mod MS, mod AS; g.) TTE 04/02/2022: EF 40-45%, glob HK, LVH, red RVSF, RVE, sev LAE, mild-mod RAE, mild MR   Chronic venous stasis dermatitis of both lower extremities    Cytomegaloviral disease 2017   Diverticulosis of colon 04/22/2013   Dyspnea    Elevated RIGHT hemidiaphragm    Erectile dysfunction    a.) on topical TRT + Trimix (alprostadil/papaverine/phentolamine) injections   ESRD (end stage renal disease) (HCC)    a.) dialysis dependent 2004-2012; b.) s/p cadaveric RIGHT renal transplant 09/13/2010   Essential hypertension 12/17/2010   GERD (gastroesophageal reflux disease)    Gout    History of 2019 novel coronavirus disease (COVID-19) 06/30/2019   History of bilateral cataract extraction 10/2017   History of renal transplant 09/13/2010   a.) s/p cadaveric donor transplant 09/13/2010   Hx of bilateral cataract extraction 10/2017   Hyperlipidemia    Hypogonadism in male 09/25/2014   Ischemic cardiomyopathy    Left cephalic vein thrombosis 06/2019   Long term current use of anticoagulant    a.) apixaban   Long term current use of immunosuppressive drug    a.) mycophenolate + prednisone + tacrolimus   Major depressive disorder  10/04/2013   Mitral stenosis 11/07/2021   a.) TTE 11/07/2021: severe MAC, mild-mod MS (MPG 6 mmHg); b.) TTE 04/02/2022: no MV stenosis   Murmur    Obesity hypoventilation syndrome 03/31/2012   OSA treated with BiPAP 09/29/2012   PAH (pulmonary artery hypertension)    a. 04/2019 RHC: RA 19, RV 80/20, PA 78/31 (51), PCWP 25, CO/CI 7.69/3.26. PVR 3.25 --> Sev PAH, likely primarily PV HTN; b.) RHC 04/04/2022: mRA 13, mPA 40, mPCWP 20, PA sat 59, AO sat 92, CO 7.63, CI 3.26, PVR 2.6 --> mod portal venous HTN   Pancreatitis    S/P CABG x 4 08/13/2002   a.) LIMA-LAD, LRA-OM, SVG-D1, SVG-dRCA   S/P gastric bypass    Synovitis of finger 06/11/2019   Trigger finger, unspecified little finger 12/23/2018   Type 2 diabetes mellitus with hyperglycemia, with long-term current use of insulin 09/09/2014   a.) has FreeStyle Libre CGM   Ulcer    Left shin goes to wound care center [facility] qweek   Wears glasses    Wears hearing aid in both ears     Past Surgical History:  Procedure Laterality Date   CARDIAC CATHETERIZATION N/A 08/10/2002   CATARACT EXTRACTION W/PHACO Right 10/29/2017   Procedure: CATARACT EXTRACTION PHACO AND INTRAOCULAR LENS PLACEMENT (IOC)  RIGHT DIABETIC;  Surgeon: Lockie Mola, MD;  Location: [facility];  Service: Ophthalmology;  Laterality: Right   CATARACT EXTRACTION W/PHACO Left 11/18/2017   Procedure: CATARACT EXTRACTION PHACO AND INTRAOCULAR LENS PLACEMENT (IOC) LEFT IVA/TOPICAL;  Surgeon: Lockie Mola, MD;  Location: [facility];  Service: Ophthalmology;  Laterality: Left   COLONOSCOPY WITH PROPOFOL N/A 04/22/2013   Procedure: COLONOSCOPY WITH PROPOFOL;  Surgeon: Rachael Fee, MD;  Location: [facility];  Service: Endoscopy;  Laterality: N/A;   CORONARY ANGIOPLASTY WITH STENT PLACEMENT N/A 1996   CORONARY ARTERY BYPASS GRAFT N/A 08/13/2002   Procedure: 4v CORONARY ARTERY BYPASS GRAFTING; Location: [facility]; Surgeon: Katy Apo, MD    CYSTOSCOPY WITH INSERTION OF UROLIFT N/A 12/25/2021   Procedure: CYSTOSCOPY WITH INSERTION OF UROLIFT;  Surgeon: Riki Altes, MD;  Location: [facility];  Service: Urology;  Laterality: N/A;   DG ANGIO AV SHUNT*L*     right and left upper arms   FASCIOTOMY  03/03/2012   Procedure: FASCIOTOMY;  Surgeon: Nicki Reaper, MD;  Location: [facility];  Service: Orthopedics;  Laterality: Right;  FASCIOTOMY RIGHT SMALL FINGER   FASCIOTOMY Left 08/17/2013   Procedure: FASCIOTOMY LEFT RING;  Surgeon: Nicki Reaper, MD;  Location: [facility];  Service: Orthopedics;  Laterality: Left;   INCISION AND DRAINAGE ABSCESS Left 10/15/2015   Procedure: INCISION AND DRAINAGE ABSCESS;  Surgeon: Leafy Ro, MD;  Location: [facility];  Service: General;  Laterality: Left;   KIDNEY TRANSPLANT  09/13/2010   cadaver--at [facility]   LASER ABLATION CONDOLAMATA N/A 01/08/2023   Procedure: LASER REMOVAL ABLATION OF CONDYLOMATA;  Surgeon: Karie Soda, MD;  Location: [facility];  Service: General;  Laterality: N/A;  GEN AND LOCAL   RECTAL EXAM UNDER ANESTHESIA N/A 01/08/2023   Procedure: ANORECTAL EXAM UNDER ANESTHESIA;  Surgeon: Karie Soda, MD;  Location: [facility];  Service: General;  Laterality: N/A;   RIGHT HEART CATH N/A 11/15/2016   Procedure: RIGHT HEART CATH;  Surgeon: Iran Ouch, MD;  Location: [facility];  Service: Cardiovascular;  Laterality: N/A;   RIGHT HEART CATH N/A 05/03/2019   Procedure: RIGHT HEART CATH;  Surgeon: Laurey Morale, MD;  Location: [facility];  Service: Cardiovascular;  Laterality: N/A;   RIGHT HEART CATH N/A 04/04/2022   Procedure: RIGHT HEART CATH;  Surgeon: Laurey Morale, MD;  Location: [facility];  Service: Cardiovascular;  Laterality: N/A;   ROUX-EN-Y GASTRIC BYPASS N/A 2015   TYMPANIC MEMBRANE REPAIR Left 03/2010   VASECTOMY     WART FULGURATION N/A 05/31/2022   Procedure: FULGURATION ANAL WART;  Surgeon: Sung Amabile, DO;   Location: [facility];  Service: General;  Laterality: N/A;    Family History  Problem Relation Age of Onset   Heart disease Father    Kidney failure Father    Kidney disease Father    Diabetes Maternal Grandmother    Breast cancer Maternal Grandmother    Valvular heart disease Mother    Liver cancer Paternal Uncle    Liver cancer Paternal Grandmother    Prostate cancer Neg Hx     Social History   Socioeconomic History   Marital status: Married    Spouse name: Not on file   Number of children: 2   Years of education: 14   Highest education level: Associate degree: academic program  Occupational History   Occupation: Presenter, broadcasting: unemployed    Comment: disabled due to kidney failure  Tobacco Use   Smoking status: Former    Types: Cigars    Quit date: 09/02/1994    Years since quitting: 28.6    Passive exposure: Never   Smokeless tobacco: Never  Vaping Use   Vaping status: Never Used  Substance and Sexual Activity   Alcohol use: Not Currently    Comment: occ   Drug use: No   Sexual activity: Not on file  Other Topics Concern   Not on file  Social History Narrative   Has living will   Wife is health care POA---son Molli Hazard is alternate   Would accept resuscitation attempts   No tube feedings if cognitively unaware   Social Drivers of Corporate investment banker Strain: Not on  file  Food Insecurity: No Food Insecurity (04/11/2023)   Hunger Vital Sign    Worried About Running Out of Food in the Last Year: Never true    Ran Out of Food in the Last Year: Never true  Transportation Needs: No Transportation Needs (04/11/2023)   PRAPARE - Administrator, Civil Service (Medical): No    Lack of Transportation (Non-Medical): No  Physical Activity: Not on file  Stress: Not on file  Social Connections: Socially Integrated (04/11/2023)   Social Connection and Isolation Panel [NHANES]    Frequency of Communication with Friends and  Family: More than three times a week    Frequency of Social Gatherings with Friends and Family: More than three times a week    Attends Religious Services: More than 4 times per year    Active Member of [employer] or Organizations: Yes    Attends Engineer, structural: More than 4 times per year    Marital Status: Married  Catering manager Violence: Not At Risk (04/11/2023)   Humiliation, Afraid, Rape, and Kick questionnaire    Fear of Current or Ex-Partner: No    Emotionally Abused: No    Physically Abused: No    Sexually Abused: No   Review of Systems No N/V Eating--but not much    Objective:   Physical Exam Constitutional:      Appearance: Normal appearance.     Comments: Still with rib pain with movement  Cardiovascular:     Rate and Rhythm: Rhythm irregular.     Heart sounds:     No gallop.     Comments: Gr 2/6 coarse systolic murmur Pulmonary:     Effort: Pulmonary effort is normal.     Breath sounds: Normal breath sounds. No wheezing or rales.  Abdominal:     Palpations: Abdomen is soft.     Tenderness: There is no abdominal tenderness.  Musculoskeletal:     Cervical back: Neck supple.     Comments: Edema better---but marked stasis changes without ulceration  Lymphadenopathy:     Cervical: No cervical adenopathy.  Neurological:     Mental Status: He is alert.            Assessment & Plan:

## 2023-04-21 NOTE — Assessment & Plan Note (Signed)
Now dizzy Asked him to hold the torsemide for 2 days or until weight starts coming back up

## 2023-04-22 ENCOUNTER — Other Ambulatory Visit: Payer: Self-pay

## 2023-04-22 DIAGNOSIS — H35371 Puckering of macula, right eye: Secondary | ICD-10-CM | POA: Diagnosis not present

## 2023-04-22 DIAGNOSIS — H35372 Puckering of macula, left eye: Secondary | ICD-10-CM | POA: Diagnosis not present

## 2023-04-22 DIAGNOSIS — Z961 Presence of intraocular lens: Secondary | ICD-10-CM | POA: Diagnosis not present

## 2023-04-22 DIAGNOSIS — E113593 Type 2 diabetes mellitus with proliferative diabetic retinopathy without macular edema, bilateral: Secondary | ICD-10-CM | POA: Diagnosis not present

## 2023-05-01 ENCOUNTER — Other Ambulatory Visit: Payer: Self-pay | Admitting: Internal Medicine

## 2023-05-01 ENCOUNTER — Other Ambulatory Visit: Payer: Self-pay

## 2023-05-01 ENCOUNTER — Telehealth: Payer: Self-pay | Admitting: Cardiology

## 2023-05-01 DIAGNOSIS — Z7960 Long term (current) use of unspecified immunomodulators and immunosuppressants: Secondary | ICD-10-CM | POA: Diagnosis not present

## 2023-05-01 DIAGNOSIS — M0579 Rheumatoid arthritis with rheumatoid factor of multiple sites without organ or systems involvement: Secondary | ICD-10-CM | POA: Diagnosis not present

## 2023-05-01 DIAGNOSIS — M109 Gout, unspecified: Secondary | ICD-10-CM | POA: Diagnosis not present

## 2023-05-01 MED FILL — Tamsulosin HCl Cap 0.4 MG: ORAL | 30 days supply | Qty: 30 | Fill #3 | Status: AC

## 2023-05-01 NOTE — Telephone Encounter (Signed)
 Lvm for pt appt for 05/02/23

## 2023-05-02 ENCOUNTER — Other Ambulatory Visit: Payer: Self-pay

## 2023-05-02 ENCOUNTER — Ambulatory Visit: Payer: Commercial Managed Care - PPO | Attending: Cardiology | Admitting: Cardiology

## 2023-05-02 VITALS — BP 144/81 | HR 56

## 2023-05-02 DIAGNOSIS — I5032 Chronic diastolic (congestive) heart failure: Secondary | ICD-10-CM

## 2023-05-02 NOTE — Patient Instructions (Signed)
 EKG done today.  Labs done today. We will contact you only if your labs are abnormal.  No other medication changes were made. Please continue all current medications as prescribed.  Your physician recommends that you schedule a follow-up appointment in: 3 months. Please contact our office in April to schedule a May appointment.   If you have any questions or concerns before your next appointment please send us  a message through [place] or call our office at [phone].    TO LEAVE A MESSAGE FOR THE NURSE SELECT OPTION 2, PLEASE LEAVE A MESSAGE INCLUDING: YOUR NAME DATE OF BIRTH CALL BACK NUMBER REASON FOR CALL**this is important as we prioritize the call backs  YOU WILL RECEIVE A CALL BACK THE SAME DAY AS LONG AS YOU CALL BEFORE 4:00 PM   Do the following things EVERYDAY: Weigh yourself in the morning before breakfast. Write it down and keep it in a log. Take your medicines as prescribed Eat low salt foods--Limit salt (sodium) to 2000 mg per day.  Stay as active as you can everyday Limit all fluids for the day to less than 2 liters   At the [facility], you and your health needs are our priority. As part of our continuing mission to provide you with exceptional heart care, we have created designated Provider Care Teams. These Care Teams include your primary Cardiologist (physician) and Advanced Practice Providers (APPs- Physician Assistants and Nurse Practitioners) who all work together to provide you with the care you need, when you need it.   You may see any of the following providers on your designated Care Team at your next follow up: Dr Toribio Fuel Dr Ezra Shuck Dr. Ria Gardenia Greig Lenetta, NP Caffie Shed, [place] [facility] [place], [place] Beckey Coe, NP Tinnie Redman, PharmD   Please be sure to bring in all your medications bottles to every appointment.    Thank you for choosing [facility]

## 2023-05-02 NOTE — Progress Notes (Signed)
 Advanced Heart Failure Clinic Note   Date:  05/02/2023   ID:  Carl Colina., DOB 08/23/1960, MRN [id number]   Provider location: [address], [place] Type of Visit:  Established patient  PCP:  [name], MD  Cardiologist:  Dr. Darron Nephrology: Dr. Joshua  HF Cardiologist: Dr. Rolan  Chief complaint: CHF  HPI: Carl Teshaun Olarte. is a 63 y.o. male  with a history of CAD s/p CABG 2005, chronic atrial fibrillation on Eliquis , ESRD s/p renal transplant 2012, anc chronic diastolic CHF.  His renal function has been fairly stable, most recent creatinine 1.43.  He has been struggling with volume overload/CHF.  He has a history of ischemic cardiomyopathy with EF as low as 35-40% in the past but echo in 3/20 showed EF 60-65%.  There is evidence for RV failure with pulmonary hypertension by echo.    Admitted 11/05/18 low grade fever and shortness of breath. HS Trop negative. Covid 19 negative. Blood CX negative. Placed on antibiotics and discharged to home the next day.   Admitted 05/03/19 with A/C Diastolic HF. Diuresed with IV lasix  and transitioned to torsemide  80/40 mg . Discharge weight 252 pounds.   Admitted 06/2019 with left arm pain. Left upper extremity venous Doppler revealed occluded cephalic vein at the AV fistula outflow tract in the antecubital fossa with no other thrombus identified. Vascular evaluated. He continued on Eliquis .  No plan for intervention. Also treated for acute gout flare. Discharged on prednisone  taper.     Given IV lasix  on 12/21 by HF Paramedicine.   Admitted to [facility] 2/22 with acute gout flare vs cellulitis. Nephrology was consulted to evaluate transplant regimen and follow along. He was treated with antibiotics and prednisone . Discharged on decreased torsemide  dose of 40 mg daily. Weight 259 lbs.  Admitted 4/22 for left leg cellulitis after sustaining a dog scratch, received IV antibiotics. No changes to his diuretic  therapy. Discharge weight 266 lbs.  Follow up with Dr. Darron, 1/23, he had CP and underwent Lexiscan  myoview , which overall was a low risk study with evidence of prior infarct in the infero-apical area with no significant change from before. Losartan  added to GDMT regimen.  Follow up 5/23, volume overloaded, REDs 40%. Advised taking additional metolazone  (3 doses that week), however labs showed worsening renal function and advised to keep at 2 doses.  Admitted in 5/23 with acute on chronic CHF and AKI.  Spiro, Farxiga  and losartan  stopped. Diuresed with IV lasix . Echo showed EF 40-45%, RV mildly reduced, GIIIDD (restrictive), IVC dilated. Nephrology consulted for diuretic dosing. Discharged home, weight 266 lbs.  He had chest pain in 8/23 and was admitted at [facility], minimal HS-TnI elevation with no trend, thought to be demand ischemia from volume overload.    Echo 8/23 showing EF 40-45%, diffuse hypokinesis with dyssynchrony, mildly dilated RV with mildly decreased systolic function, IVC dilated, moderate AS with mean gradient 15, mild-moderate mitral stenosis with mean gradient 6.   Follow up 03/27/22, volume overloaded with 25 lbs weight gain. Instructed to use Furoscix  x 3 days, then resume home dose of torsemide  + metolazone . Clinic weight 277 lbs.  Admitted from [facility] 04/01/22 with a/c CHF. Echo showed EF 40-45%, RV moderately reduced and RVSP 49 mmHg. RHC showed elevated filling pressures, moderate primarily pulmonary venous hypertension and preserved CO. Tadalafil  stopped with primarily PH group 2 and 3. He was diuresed with IV lasix  and metolazone , and GDMT added back. Overall down  19 lbs. He was discharged home, weight 255 lbs.  Patient was admitted in 3/24 with Serratia bacteremia/pyelonephritis, treated with ertapenem .  He also had campylobacter diarrhea.  In 4/24, he tripped getting off his riding lawnmower and fell, fracturing ribs. He had a pulmonary contusion with this.   Patient was  admitted with slurred speech and profuse diarrhea in 11/24.  MRI brain showed no acute stroke.  He had AKI on CKD stage IV with creatinine up to 4.3, presentation thought to be due to diarrhea with dehydration related to use of Mounjaro .  He was given IV fluid and diuretics were stopped.  Losartan  and spironolactone  were stopped. He was sent home without diuretics.   Echo in 12/24 was stable with EF 45-50%, low normal RV function, mild MR.  Zio monitor for bradycardia in 12/24 showed average HR 52, no pauses, 4.7% PVCs.   Patient fell while on a cruise in 1/25 and broke ribs on the right (mechanical fall).  He was later admitted in 1/25 with influenza A and suspected development of secondary bacterial PNA.    Today he returns for HF follow up. Weight is down 4 lbs. No dyspnea walking on flat ground, mild dyspnea walking up stairs.  Ribs are still sore on the right from his fall.  He has occasional nonexertional chest heaviness, no trigger.  No orthopnea/PND.  No lightheadedness or syncope.  HR remains in 50s.   ECG (personally reviewed): atrial fibrillation rate 55, IVCD 148 msec  Labs (7/19): LDL 49 Labs (2/20): K 3.7, creatinine 1.43 Labs (4/20): K 4.3, creatinine 1.24 Labs (11/06/18): K 3.7 creatinine 1.23  Labs (05/07/19): K 3.8 Creatinine 1.5  Labs (07/18/19): K 4.3 Creatinine 1.42  Labs (5/21): K 3.3, creatinine 1.21 Labs (11/21): K 3.6, creatinine 1.4 Labs (12/21): K 3.3, creatinine 2.4, hgb 12.8 Labs (04/01/19): K 3.3 Creatinine 2.2  Labs (8/22): K 4.0, creatinine 1.6, HDL 35, LDL 36, a1c 7.5 Labs (2/23): LDL 31, HDL 36 Labs (4/23): K 3.3, creatinine 1.68 Labs (5/23): K 4.1, creatinine 2.24 Labs (8/23): K 3.5, creatinine 2.36 Labs (12/23): K 3.6, creatinine 1.99, hgb 9.3 Labs (1/24): K 4.6, creatinine 2.44 Labs (2/24): K 3.4, creatinine 2.75, LDL 25 Labs (4/24): K 4.1, creatinine 2.46 Labs (5/24): K 2.7, creatinine 2.25, IgG monoclonal protein on myeloma panel.  Labs (6/24): K 3.8,  creatinine 2.58 Labs (9/24): BNP 265, K 4.1, creatinine 2.83, LFTs normal, hgb 13.6 Labs (11/24): K 3.3 => 3.7, creatinine 4.3 => 1.88 => 2.08 Labs (1/25): LDL 17, K 4.9, creatinine 3.3  PMH: 1. CAD: s/p CABG x 4 in [place]  in 2005.  - Cardiolite (1/20): EF 47%, small fixed apical septal defect, no ischemia.  Low risk study.  - Cardiolite (12/22): small apical inferior defect, no changes from prior, no ischemia. Low risk study. 2. Asthma 3. Gout 4. Atrial fibrillation: Chronic.  5. ESRD s/p renal transplantation at [facility] in 2012.  6. Anemia of chronic disease 7. Type II diabetes 8. HTN 9. Hyperlipidemia 10. OSA: On bipap.  11. Ischemic cardiomyopathy: EF 35-40% in past.  - Cardiolite (1/20) with EF 47%.  - Echo (1/20): EF 50-55%, mild MR, RV moderately dilated with moderately decreased systolic function, PASP 73 mmHg, moderate TR.   - Echo (3/20): EF 60-65%, PASP 75 mmHg, mildly dilated RV with mildly decreased systolic function.  - Echo (2/21): EF 45-50%, global HK, moderately dilated RV with moderately decreased systolic function, D-shaped interventricular septum, dilated IVC.  - RHC (2/21): mean RA  19, PA 78/31 mean 51, mean PCWP 26, CI 3.26, PVR 3.25 - Echo (5/23): EF 40-45%, RV mildly reduced, GIIIDD (restrictive) - Echo (8/23): EF 40-45%, diffuse hypokinesis with dyssynchrony, mildly dilated RV with mildly decreased systolic function, IVC dilated, moderate AS with mean gradient 15, mild-moderate mitral stenosis with mean gradient 6.  - Echo (1/24): Echo showed EF 40-45%, RV moderately reduced and RVSP 49 mmHg, mild MR, no AS, dilated IVC.  - RHC (1/24): RA mean 13, PA 62/24 (mean 40), PCWP mean 20, CO/CI (Fick) 7.63/3.26, PVR 2.6 WU, PAPi 2.9 - Echo (12/24): EF 45-50%, low normal RV function, mild MR. 12. Obesity: s/p gastric bypass in 2015.  13. Bradycardia - Zio monitor (12/24): average HR 52, no pauses, 4.7% PVCs 14. Aortic stenosis: Moderate on 8/23 echo.  15. Mitral  stenosis: Mild to moderate on 8/23 echo.  16. Rheumatoid arthritis 17. IgG monoclonal gammopathy 18. PAD: ABIs (10/24) with noncompressible ABIs, normal TBIs.  19. H/o Bell's palsy  Past Surgical History:  Procedure Laterality Date   CARDIAC CATHETERIZATION N/A 08/10/2002   CATARACT EXTRACTION W/PHACO Right 10/29/2017   Procedure: CATARACT EXTRACTION PHACO AND INTRAOCULAR LENS PLACEMENT (IOC)  RIGHT DIABETIC;  Surgeon: Mittie Gaskin, MD;  Location: [facility];  Service: Ophthalmology;  Laterality: Right   CATARACT EXTRACTION W/PHACO Left 11/18/2017   Procedure: CATARACT EXTRACTION PHACO AND INTRAOCULAR LENS PLACEMENT (IOC) LEFT IVA/TOPICAL;  Surgeon: Mittie Gaskin, MD;  Location: [facility];  Service: Ophthalmology;  Laterality: Left   COLONOSCOPY WITH PROPOFOL  N/A 04/22/2013   Procedure: COLONOSCOPY WITH PROPOFOL ;  Surgeon: Toribio SHAUNNA Cedar, MD;  Location: [facility];  Service: Endoscopy;  Laterality: N/A;   CORONARY ANGIOPLASTY WITH STENT PLACEMENT N/A 1996   CORONARY ARTERY BYPASS GRAFT N/A 08/13/2002   Procedure: 4v CORONARY ARTERY BYPASS GRAFTING; Location: [facility]; Surgeon: Dessa Laine, MD   CYSTOSCOPY WITH INSERTION OF UROLIFT N/A 12/25/2021   Procedure: CYSTOSCOPY WITH INSERTION OF UROLIFT;  Surgeon: Twylla Glendia BROCKS, MD;  Location: [facility];  Service: Urology;  Laterality: N/A;   DG ANGIO AV SHUNT*L*     right and left upper arms   FASCIOTOMY  03/03/2012   Procedure: FASCIOTOMY;  Surgeon: Arley JONELLE Curia, MD;  Location: [facility];  Service: Orthopedics;  Laterality: Right;  FASCIOTOMY RIGHT SMALL FINGER   FASCIOTOMY Left 08/17/2013   Procedure: FASCIOTOMY LEFT RING;  Surgeon: Arley JONELLE Curia, MD;  Location: [facility];  Service: Orthopedics;  Laterality: Left;   INCISION AND DRAINAGE ABSCESS Left 10/15/2015   Procedure: INCISION AND DRAINAGE ABSCESS;  Surgeon: Laneta JULIANNA Luna, MD;  Location: [facility];  Service: General;   Laterality: Left;   KIDNEY TRANSPLANT  09/13/2010   cadaver--at [facility]   LASER ABLATION CONDOLAMATA N/A 01/08/2023   Procedure: LASER REMOVAL ABLATION OF CONDYLOMATA;  Surgeon: Sheldon Standing, MD;  Location: [facility];  Service: General;  Laterality: N/A;  GEN AND LOCAL   RECTAL EXAM UNDER ANESTHESIA N/A 01/08/2023   Procedure: ANORECTAL EXAM UNDER ANESTHESIA;  Surgeon: Sheldon Standing, MD;  Location: [facility];  Service: General;  Laterality: N/A;   RIGHT HEART CATH N/A 11/15/2016   Procedure: RIGHT HEART CATH;  Surgeon: Darron Deatrice LABOR, MD;  Location: [facility];  Service: Cardiovascular;  Laterality: N/A;   RIGHT HEART CATH N/A 05/03/2019   Procedure: RIGHT HEART CATH;  Surgeon: [name], MD;  Location: [facility];  Service: Cardiovascular;  Laterality: N/A;   RIGHT HEART CATH N/A  04/04/2022   Procedure: RIGHT HEART CATH;  Surgeon: [name], MD;  Location: [facility] IN[facility]AS[facility] CV LAB;  Service: Cardiovascular;  Laterality: N/A;   ROUX-EN-Y GASTRIC BYPASS N/A 2015   TYMPANIC MEMBRANE REPAIR Left 03/2010   VASECTOMY     WART FULGURATION N/A 05/31/2022   Procedure: FULGURATION ANAL WART;  Surgeon: Tye Millet, DO;  Location: [facility];  Service: General;  Laterality: N/A;   Current Outpatient Medications  Medication Sig Dispense Refill   albuterol  (PROVENTIL ) (2.5 MG/3ML) 0.083% nebulizer solution Take 3 mLs (2.5 mg total) by nebulization every 6 (six) hours as needed for wheezing or shortness of breath. 150 mL 0   albuterol  (VENTOLIN  HFA) 108 (90 Base) MCG/ACT inhaler Inhale 2 puffs into the lungs every 6 (six) hours as needed for wheezing or shortness of breath. 6.7 g 5   amLODipine  (NORVASC ) 5 MG tablet Take 5 mg by mouth daily.     apixaban  (ELIQUIS ) 5 MG TABS tablet Take 1 tablet (5 mg total) by mouth 2 (two) times daily. 180 tablet 3   buPROPion  (WELLBUTRIN  SR) 150 MG 12 hr tablet Take 1 tablet (150 mg total) by mouth 2 (two) times daily. 180 tablet 3    cholecalciferol  (VITAMIN D3) 25 MCG (1000 UNIT) tablet Take 1,000 Units by mouth at bedtime.     colchicine  0.6 MG tablet Take 1 tablet (0.6 mg total) by mouth every other day. 45 tablet 1   Continuous Glucose Sensor (FREESTYLE LIBRE 3 SENSOR) MISC Use one every 2 weeks. 6 each 3   dapagliflozin  propanediol (FARXIGA ) 10 MG TABS tablet Take 1 tablet (10 mg total) by mouth daily before breakfast. 90 tablet 3   febuxostat  (ULORIC ) 40 MG tablet Take 3 tablets (120 mg total) by mouth daily. 90 tablet 5   fluticasone  (FLONASE ) 50 MCG/ACT nasal spray Place 2 sprays into both nostrils daily. 16 g 11   gabapentin  (NEURONTIN ) 300 MG capsule Take 1 capsule (300 mg total) by mouth 2 (two) times daily. 180 capsule 0   HYDROcodone -acetaminophen  (NORCO) 10-325 MG tablet Take 1 tablet by mouth every 4 (four) hours as needed.     hydrocortisone  2.5 % cream Apply topically 3 (three) times daily as needed. 30 g 3   Insulin  Glargine (SEMGLEE  Farmers) Inject 40 Units into the skin daily.     insulin  lispro (HUMALOG  KWIKPEN) 100 UNIT/ML KwikPen Inject 10-20 Units into the skin 3 (three) times daily. Per sliding scale     Insulin  Pen Needle (TECHLITE PEN NEEDLES) 31G X 5 MM MISC 4 (four) times daily. 400 each 3   Insulin  Pen Needle (TECHLITE PEN NEEDLES) 32G X 6 MM MISC Use 4 times a day 100 each 3   Insulin  Pen Needle (UNIFINE PENTIPS) 31G X 5 MM MISC Use 4 times daily as directed 400 each 3   losartan  (COZAAR ) 25 MG tablet Take 1 tablet (25 mg total) by mouth daily. 90 tablet 3   montelukast  (SINGULAIR ) 10 MG tablet Take 1 tablet (10 mg total) by mouth at bedtime. 30 tablet 11   Multiple Vitamin (MULTIVITAMIN WITH MINERALS) TABS tablet Take 1 tablet by mouth daily.     mycophenolate  (MYFORTIC ) 180 MG EC tablet Take 2 tablets (360 mg total) by mouth every morning AND 1 tablet (180 mg total) every evening. 360 tablet 3   omeprazole  (PRILOSEC) 20 MG capsule Take 1 capsule (20 mg total) by mouth daily. 30 capsule 11    potassium chloride  SA (KLOR-CON  M) 20 MEQ tablet  Take 3 tablets (60 mEq total) by mouth 2 (two) times daily. 180 tablet 1   predniSONE  (DELTASONE ) 5 MG tablet Take 1 tablet (5 mg total) by mouth daily. 90 tablet 3   rosuvastatin  (CRESTOR ) 10 MG tablet Take 1 tablet (10 mg total) by mouth daily. 90 tablet 3   sulfamethoxazole -trimethoprim  (BACTRIM ) 400-80 MG tablet Take 1 tablet by mouth 3 (three) times a week. 36 tablet 3   tacrolimus  (PROGRAF ) 1 MG capsule Take 2 capsules (2 mg total) by mouth every morning AND 1 capsule (1 mg total) every evening. 90 capsule 11   tamsulosin  (FLOMAX ) 0.4 MG CAPS capsule Take 1 capsule (0.4 mg total) by mouth daily. 30 capsule 11   Testosterone  20.25 MG/ACT (1.62%) GEL Place 2 Pump onto the skin daily. 150 g 3   tirzepatide  (MOUNJARO ) 5 MG/0.5ML Pen Inject 5 mg into the skin once a week. 2 mL 5   torsemide  (DEMADEX ) 20 MG tablet Take 4 tablets (80 mg total) by mouth 2 (two) times daily.     traMADol  (ULTRAM ) 50 MG tablet Take 1 tablet (50 mg total) by mouth 3 (three) times daily as needed. 30 tablet 0   Zinc  50 MG TABS Take 1 tablet (50 mg total) by mouth daily. 100 tablet 3   amoxicillin  (AMOXIL ) 500 MG capsule Take 4 capsules (2,000 mg total) by mouth as directed. Take one hour before dental appointment. 4 capsule 5   No current facility-administered medications for this visit.   Allergies:   Contrast media [iodinated contrast media], Iodine, Nsaids, and Ibuprofen   Social History:  The patient  reports that he quit smoking about 28 years ago. His smoking use included cigars. He has never been exposed to tobacco smoke. He has never used smokeless tobacco. He reports that he does not currently use alcohol. He reports that he does not use drugs.   Family History:  The patient's family history includes Breast cancer in his maternal grandmother; Diabetes in his maternal grandmother; Heart disease in his father; Kidney disease in his father; Kidney failure in his  father; Liver cancer in his paternal grandmother and paternal uncle; Valvular heart disease in his mother.   ROS:  Please see the history of present illness.   All other systems are personally reviewed and negative.   Recent Labs: 10/21/2022: NT-Pro BNP 4,402 02/03/2023: ALT 15 02/05/2023: Magnesium  2.7 02/11/2023: TSH 2.31 04/08/2023: B Natriuretic Peptide 1,427.4 04/13/2023: BUN 172; Creatinine, Ser 3.30; Hemoglobin 8.7; Platelets 95; Potassium 4.9; Sodium 136  Personally reviewed   Wt Readings from Last 3 Encounters:  04/21/23 249 lb (112.9 kg)  04/13/23 261 lb 0.4 oz (118.4 kg)  04/07/23 254 lb (115.2 kg)    BP (!) 144/81   Pulse (!) 56   SpO2 99%  General: NAD Neck: No JVD, no thyromegaly or thyroid  nodule.  Lungs: Clear to auscultation bilaterally with normal respiratory effort. CV: Nondisplaced PMI.  Heart regular S1/S2, no S3/S4, 2/6 early SEM RUSB.  1+ ankle edema.  No carotid bruit.  Difficult to palpate pedal pulses.  Abdomen: Soft, nontender, no hepatosplenomegaly, no distention.  Skin: Intact without lesions or rashes.  Neurologic: Alert and oriented x 3.  Psych: Normal affect. Extremities: No clubbing or cyanosis.  HEENT: Normal.   Assessment & Plan: 1. Chronic heart failure with Mid Range EF/ With prominent RV failure/pulmonary hypertension:  Ischemic cardiomyopathy.  ?If RV failure is related to severe OSA, he was a remote smoker. Echo in 2/21 with EF 45-50%,  moderately dilated RV with moderately decreased systolic function, moderate pulmonary hypertension. Echo in 12/24 showed EF 45-50%, low normal RV function, mild MR. Management complicated by CKD stage 4.  NYHA class II.  He does not look volume overloaded on exam.     - Continue torsemide  80 mg bid.  BMET/BNP today.  - Continue losartan  25 mg daily.  - Will not start spironolactone  with CKD stage IV.  - Continue Farxiga  10 mg daily. Will need to watch closely for further UTIs.   - Tadalafil  was stopped due to  group 2 and 3 PH (not group 1).  2. Atrial fibrillation: Chronic. Rate controlled.  - Continue Eliquis .  - Not on beta blocker with bradycardia.   3. CAD: S/p CABG 2005.  Cardiolite 12/22 with prior MI, no ischemia.  No chest pain. High threshold for cath given CKD.  - No ASA given Eliquis  use.  - Continue Crestor  10 mg daily. Good lipids in 1/25.  4. CKD Stage 4: S/p renal transplantation in 2012. Follow[facility] center [facility]. He remains on mycophenolate  and tacrolimus .   - BMET today.  5. OSA: Continue Bipap every night.  6. HTN: BP generally controlled.  - Continue losartan  - Continue amlodipine  7. DM 2: On insulin . He follows with Endocrinology. - Continue dapagliflozin  for now but watch for further GU symptoms. - He is on Mounjaro   8. Pulmonary hypertension: Primarily pulmonary venous hypertension by RHC in 1/24 though there may be group 3 PH component from OSA.  - Now off tadalafil .  9. Chronic Anemia: Gets iron  infusions.  10. IgG monoclonal protein: Needs followup with his hematologist.  11. Bradycardia: Suspect tachy-brady syndrome. HR generally in 50s.  Zio monitor in 12/24 did not show worrisome bradyarrhythmias (avg HR in 50s, no long pauses).   - No nodal blockers.  - May need pacemaker in future.   I spent 31 minutes reviewing records, interviewing/examining patient, and managing orders.    Followup 3 months   Ezra Shuck 05/02/2023

## 2023-05-03 LAB — BASIC METABOLIC PANEL
BUN/Creatinine Ratio: 25 — ABNORMAL HIGH (ref 10–24)
BUN: 62 mg/dL — ABNORMAL HIGH (ref 8–27)
CO2: 24 mmol/L (ref 20–29)
Calcium: 9 mg/dL (ref 8.6–10.2)
Chloride: 100 mmol/L (ref 96–106)
Creatinine, Ser: 2.45 mg/dL — ABNORMAL HIGH (ref 0.76–1.27)
Glucose: 96 mg/dL (ref 70–99)
Potassium: 3.7 mmol/L (ref 3.5–5.2)
Sodium: 144 mmol/L (ref 134–144)
eGFR: 29 mL/min/{1.73_m2} — ABNORMAL LOW (ref >59)

## 2023-05-03 LAB — BRAIN NATRIURETIC PEPTIDE: BNP: 443.8 pg/mL — ABNORMAL HIGH (ref 0.0–100.0)

## 2023-05-05 ENCOUNTER — Other Ambulatory Visit: Payer: Self-pay

## 2023-05-05 ENCOUNTER — Other Ambulatory Visit (HOSPITAL_COMMUNITY): Payer: Self-pay | Admitting: Cardiology

## 2023-05-05 MED ORDER — TORSEMIDE 20 MG PO TABS
80.0000 mg | ORAL_TABLET | Freq: Two times a day (BID) | ORAL | 3 refills | Status: DC
  Filled 2023-05-05: qty 240, 30d supply, fill #0
  Filled 2023-07-04: qty 240, 30d supply, fill #1
  Filled 2023-08-18: qty 240, 30d supply, fill #2
  Filled 2023-09-21: qty 240, 30d supply, fill #3

## 2023-05-07 ENCOUNTER — Other Ambulatory Visit: Payer: Self-pay

## 2023-05-07 ENCOUNTER — Other Ambulatory Visit (HOSPITAL_COMMUNITY): Payer: Self-pay

## 2023-05-13 ENCOUNTER — Other Ambulatory Visit: Payer: Self-pay

## 2023-05-14 ENCOUNTER — Other Ambulatory Visit: Payer: Self-pay

## 2023-05-14 ENCOUNTER — Other Ambulatory Visit: Payer: Self-pay | Admitting: Urology

## 2023-05-14 ENCOUNTER — Other Ambulatory Visit: Payer: Self-pay | Admitting: Internal Medicine

## 2023-05-14 DIAGNOSIS — E349 Endocrine disorder, unspecified: Secondary | ICD-10-CM

## 2023-05-14 MED ORDER — BUPROPION HCL ER (SR) 150 MG PO TB12
150.0000 mg | ORAL_TABLET | Freq: Two times a day (BID) | ORAL | 3 refills | Status: DC
  Filled 2023-05-14 – 2023-07-18 (×2): qty 180, 90d supply, fill #0
  Filled 2023-10-29: qty 180, 90d supply, fill #1
  Filled 2023-12-31 – 2024-03-11 (×2): qty 180, 90d supply, fill #2

## 2023-05-14 MED ORDER — TESTOSTERONE 20.25 MG/ACT (1.62%) TD GEL
2.0000 | Freq: Every day | TRANSDERMAL | 3 refills | Status: DC
  Filled 2023-05-14: qty 150, 60d supply, fill #0
  Filled 2023-07-23: qty 75, 30d supply, fill #1

## 2023-05-15 ENCOUNTER — Other Ambulatory Visit: Payer: Self-pay

## 2023-05-16 ENCOUNTER — Other Ambulatory Visit: Payer: Self-pay

## 2023-05-16 MED ORDER — GABAPENTIN 300 MG PO CAPS
300.0000 mg | ORAL_CAPSULE | Freq: Two times a day (BID) | ORAL | 3 refills | Status: DC
  Filled 2023-05-16 – 2023-07-04 (×2): qty 180, 90d supply, fill #0
  Filled 2023-10-06: qty 180, 90d supply, fill #1
  Filled 2023-12-31: qty 180, 90d supply, fill #2

## 2023-05-19 ENCOUNTER — Other Ambulatory Visit (HOSPITAL_BASED_OUTPATIENT_CLINIC_OR_DEPARTMENT_OTHER): Payer: Self-pay

## 2023-05-19 ENCOUNTER — Other Ambulatory Visit (HOSPITAL_COMMUNITY): Payer: Self-pay

## 2023-05-19 ENCOUNTER — Other Ambulatory Visit: Payer: Self-pay

## 2023-05-19 ENCOUNTER — Encounter: Payer: Self-pay | Admitting: Oncology

## 2023-05-21 ENCOUNTER — Other Ambulatory Visit: Payer: Self-pay

## 2023-05-21 DIAGNOSIS — D508 Other iron deficiency anemias: Secondary | ICD-10-CM

## 2023-05-22 ENCOUNTER — Inpatient Hospital Stay: Payer: Commercial Managed Care - PPO | Attending: Oncology

## 2023-05-22 DIAGNOSIS — D631 Anemia in chronic kidney disease: Secondary | ICD-10-CM | POA: Diagnosis not present

## 2023-05-22 DIAGNOSIS — N184 Chronic kidney disease, stage 4 (severe): Secondary | ICD-10-CM | POA: Diagnosis not present

## 2023-05-22 DIAGNOSIS — D508 Other iron deficiency anemias: Secondary | ICD-10-CM

## 2023-05-22 LAB — CBC (CANCER CENTER ONLY)
HCT: 31.5 % — ABNORMAL LOW (ref 39.0–52.0)
Hemoglobin: 9.6 g/dL — ABNORMAL LOW (ref 13.0–17.0)
MCH: 27 pg (ref 26.0–34.0)
MCHC: 30.5 g/dL (ref 30.0–36.0)
MCV: 88.5 fL (ref 80.0–100.0)
Platelet Count: 123 10*3/uL — ABNORMAL LOW (ref 150–400)
RBC: 3.56 MIL/uL — ABNORMAL LOW (ref 4.22–5.81)
RDW: 15.3 % (ref 11.5–15.5)
WBC Count: 3.6 10*3/uL — ABNORMAL LOW (ref 4.0–10.5)
nRBC: 0 % (ref 0.0–0.2)

## 2023-05-22 LAB — IRON AND TIBC
Iron: 32 ug/dL — ABNORMAL LOW (ref 45–182)
Saturation Ratios: 9 % — ABNORMAL LOW (ref 17.9–39.5)
TIBC: 342 ug/dL (ref 250–450)
UIBC: 310 ug/dL

## 2023-05-22 LAB — FERRITIN: Ferritin: 55 ng/mL (ref 24–336)

## 2023-05-29 ENCOUNTER — Other Ambulatory Visit: Payer: Self-pay

## 2023-06-02 ENCOUNTER — Telehealth: Payer: Self-pay | Admitting: Oncology

## 2023-06-02 NOTE — Telephone Encounter (Signed)
 Patient called and got our answering service. He left a message saying he had lab work last week and his numbers are all down and he is feeling fatigued. He has an appt this Friday 3/14 for IV Iron.

## 2023-06-03 ENCOUNTER — Inpatient Hospital Stay: Attending: Oncology

## 2023-06-03 VITALS — BP 162/98 | HR 81 | Temp 96.5°F | Resp 17

## 2023-06-03 DIAGNOSIS — D508 Other iron deficiency anemias: Secondary | ICD-10-CM

## 2023-06-03 DIAGNOSIS — D631 Anemia in chronic kidney disease: Secondary | ICD-10-CM | POA: Insufficient documentation

## 2023-06-03 DIAGNOSIS — N184 Chronic kidney disease, stage 4 (severe): Secondary | ICD-10-CM | POA: Diagnosis not present

## 2023-06-03 MED ORDER — IRON SUCROSE 20 MG/ML IV SOLN
200.0000 mg | INTRAVENOUS | Status: DC
  Administered 2023-06-03: 200 mg via INTRAVENOUS

## 2023-06-03 NOTE — Patient Instructions (Signed)
 [facility] - A DEPT OF [facility]  Discharge Instructions: Thank you for choosing [facility] to provide your oncology and hematology care.  If you have a lab appointment with the Cancer Center, please go directly to the Cancer Center and check in at the registration area.  Wear comfortable clothing and clothing appropriate for easy access to any Portacath or PICC line.   We strive to give you quality time with your provider. You may need to reschedule your appointment if you arrive late (15 or more minutes).  Arriving late affects you and other patients whose appointments are after yours.  Also, if you miss three or more appointments without notifying the office, you may be dismissed from the clinic at the provider's discretion.      For prescription refill requests, have your pharmacy contact our office and allow 72 hours for refills to be completed.    Today you received the following chemotherapy and/or immunotherapy agents venofer      To help prevent nausea and vomiting after your treatment, we encourage you to take your nausea medication as directed.  BELOW ARE SYMPTOMS THAT SHOULD BE REPORTED IMMEDIATELY: *FEVER GREATER THAN 100.4 F (38 C) OR HIGHER *CHILLS OR SWEATING *NAUSEA AND VOMITING THAT IS NOT CONTROLLED WITH YOUR NAUSEA MEDICATION *UNUSUAL SHORTNESS OF BREATH *UNUSUAL BRUISING OR BLEEDING *URINARY PROBLEMS (pain or burning when urinating, or frequent urination) *BOWEL PROBLEMS (unusual diarrhea, constipation, pain near the anus) TENDERNESS IN MOUTH AND THROAT WITH OR WITHOUT PRESENCE OF ULCERS (sore throat, sores in mouth, or a toothache) UNUSUAL RASH, SWELLING OR PAIN  UNUSUAL VAGINAL DISCHARGE OR ITCHING   Items with * indicate a potential emergency and should be followed up as soon as possible or go to the Emergency Department if any problems should occur.  Please show the CHEMOTHERAPY ALERT CARD or IMMUNOTHERAPY  ALERT CARD at check-in to the Emergency Department and triage nurse.  Should you have questions after your visit or need to cancel or reschedule your appointment, please contact [facility] - A DEPT OF [facility]  [phone] and follow the prompts.  Office hours are 8:00 a.m. to 4:30 p.m. Monday - Friday. Please note that voicemails left after 4:00 p.m. may not be returned until the following business day.  We are closed weekends and major holidays. You have access to a nurse at all times for urgent questions. Please call the main number to the clinic [phone] and follow the prompts.  For any non-urgent questions, you may also contact your provider using MyChart. We now offer e-Visits for anyone 3 and older to request care online for non-urgent symptoms. For details visit [email or web address].   Also download the MyChart app! Go to the app store, search "MyChart", open the app, select [facility], and log in with your MyChart username and password.

## 2023-06-04 ENCOUNTER — Other Ambulatory Visit: Payer: Self-pay

## 2023-06-04 MED FILL — Montelukast Sodium Tab 10 MG (Base Equiv): ORAL | 90 days supply | Qty: 90 | Fill #4 | Status: AC

## 2023-06-04 MED FILL — Omeprazole Cap Delayed Release 20 MG: ORAL | 90 days supply | Qty: 90 | Fill #4 | Status: AC

## 2023-06-04 MED FILL — Tamsulosin HCl Cap 0.4 MG: ORAL | 90 days supply | Qty: 90 | Fill #4 | Status: AC

## 2023-06-06 ENCOUNTER — Telehealth: Payer: Self-pay | Admitting: Internal Medicine

## 2023-06-06 ENCOUNTER — Inpatient Hospital Stay

## 2023-06-06 NOTE — Telephone Encounter (Signed)
 Patient dropped off document Insurance Form, to be filled out by provider. Patient requested to send it back via Fax within 7-days. Document is located in providers tray at front office.Please advise at [facility] [phone].   Patient will also like a hardcopy of the ppw once its completed and faxed over. Thank you!

## 2023-06-10 NOTE — Telephone Encounter (Signed)
 Left message to call office. Provided the information Dr Alphonsus Sias mentioned in his note.

## 2023-06-11 ENCOUNTER — Inpatient Hospital Stay

## 2023-06-11 ENCOUNTER — Other Ambulatory Visit: Payer: Self-pay | Admitting: Oncology

## 2023-06-11 VITALS — BP 134/50 | HR 76 | Temp 97.0°F | Resp 18

## 2023-06-11 DIAGNOSIS — D631 Anemia in chronic kidney disease: Secondary | ICD-10-CM | POA: Diagnosis not present

## 2023-06-11 DIAGNOSIS — N184 Chronic kidney disease, stage 4 (severe): Secondary | ICD-10-CM | POA: Diagnosis not present

## 2023-06-11 DIAGNOSIS — D508 Other iron deficiency anemias: Secondary | ICD-10-CM

## 2023-06-11 MED ORDER — SODIUM CHLORIDE 0.9% FLUSH
10.0000 mL | Freq: Once | INTRAVENOUS | Status: AC
  Administered 2023-06-11: 10 mL via INTRAVENOUS
  Filled 2023-06-11: qty 10

## 2023-06-11 MED ORDER — IRON SUCROSE 20 MG/ML IV SOLN
200.0000 mg | INTRAVENOUS | Status: DC
  Administered 2023-06-11: 200 mg via INTRAVENOUS

## 2023-06-12 DIAGNOSIS — H1131 Conjunctival hemorrhage, right eye: Secondary | ICD-10-CM | POA: Diagnosis not present

## 2023-06-12 NOTE — Telephone Encounter (Signed)
 Relayed message below to patient, he does not understand.  I reviewed the form, which is a [employer] claim form  I asked the patient if he is concerned about not returning to work or financial aspect of hospital stay due to having influenza  Patient states he is trying to recoup some money from his expensive hospital stay.  I advised that the MD tried to complete the form, but the criteria (as mentioned below) was not an option the provider felt comfortable selecting  Patient to stop by and pick up forms and go back to his insurance company for more information

## 2023-06-12 NOTE — Telephone Encounter (Signed)
 Patient picked up form at front desk.

## 2023-06-16 ENCOUNTER — Inpatient Hospital Stay

## 2023-06-16 ENCOUNTER — Other Ambulatory Visit: Payer: Self-pay

## 2023-06-16 VITALS — BP 132/68 | HR 62 | Temp 95.0°F | Resp 18

## 2023-06-16 DIAGNOSIS — N184 Chronic kidney disease, stage 4 (severe): Secondary | ICD-10-CM | POA: Diagnosis not present

## 2023-06-16 DIAGNOSIS — D631 Anemia in chronic kidney disease: Secondary | ICD-10-CM | POA: Diagnosis not present

## 2023-06-16 DIAGNOSIS — D508 Other iron deficiency anemias: Secondary | ICD-10-CM

## 2023-06-16 MED ORDER — SODIUM CHLORIDE 0.9% FLUSH
10.0000 mL | Freq: Once | INTRAVENOUS | Status: AC
  Administered 2023-06-16: 10 mL via INTRAVENOUS
  Filled 2023-06-16: qty 10

## 2023-06-16 MED ORDER — IRON SUCROSE 20 MG/ML IV SOLN
200.0000 mg | INTRAVENOUS | Status: DC
  Administered 2023-06-16: 200 mg via INTRAVENOUS

## 2023-06-16 MED ORDER — COLCHICINE 0.6 MG PO TABS
0.6000 mg | ORAL_TABLET | ORAL | 1 refills | Status: DC
  Filled 2023-06-16 – 2023-07-04 (×2): qty 45, 90d supply, fill #0

## 2023-06-16 MED ORDER — INSULIN GLARGINE-YFGN 100 UNIT/ML ~~LOC~~ SOPN
20.0000 [IU] | PEN_INJECTOR | Freq: Every day | SUBCUTANEOUS | 3 refills | Status: DC
Start: 2023-06-16 — End: 2024-02-09
  Filled 2023-06-16 (×2): qty 15, 75d supply, fill #0
  Filled 2023-07-18 – 2023-09-09 (×2): qty 15, 75d supply, fill #1
  Filled 2023-11-20: qty 15, 75d supply, fill #2

## 2023-06-17 ENCOUNTER — Telehealth: Payer: Self-pay

## 2023-06-17 DIAGNOSIS — G4733 Obstructive sleep apnea (adult) (pediatric): Secondary | ICD-10-CM | POA: Diagnosis not present

## 2023-06-17 DIAGNOSIS — E662 Morbid (severe) obesity with alveolar hypoventilation: Secondary | ICD-10-CM | POA: Diagnosis not present

## 2023-06-18 ENCOUNTER — Other Ambulatory Visit: Payer: Self-pay

## 2023-06-18 ENCOUNTER — Other Ambulatory Visit: Payer: Self-pay | Admitting: Internal Medicine

## 2023-06-18 ENCOUNTER — Inpatient Hospital Stay

## 2023-06-18 VITALS — BP 137/60 | HR 72 | Temp 96.3°F | Resp 18

## 2023-06-18 DIAGNOSIS — D631 Anemia in chronic kidney disease: Secondary | ICD-10-CM | POA: Diagnosis not present

## 2023-06-18 DIAGNOSIS — N184 Chronic kidney disease, stage 4 (severe): Secondary | ICD-10-CM | POA: Diagnosis not present

## 2023-06-18 DIAGNOSIS — H1133 Conjunctival hemorrhage, bilateral: Secondary | ICD-10-CM | POA: Diagnosis not present

## 2023-06-18 DIAGNOSIS — D508 Other iron deficiency anemias: Secondary | ICD-10-CM

## 2023-06-18 MED ORDER — IRON SUCROSE 20 MG/ML IV SOLN
200.0000 mg | INTRAVENOUS | Status: DC
  Administered 2023-06-18: 200 mg via INTRAVENOUS

## 2023-06-18 MED ORDER — OFLOXACIN 0.3 % OP SOLN
1.0000 [drp] | Freq: Three times a day (TID) | OPHTHALMIC | 0 refills | Status: DC
Start: 2023-06-18 — End: 2023-09-29
  Filled 2023-06-18: qty 5, 30d supply, fill #0

## 2023-06-18 NOTE — Patient Instructions (Signed)

## 2023-06-18 NOTE — Progress Notes (Signed)
 Pt here for iron.  Bil eyes very red ans swollen, right eye with ecchymosis. Denies pain or itching, not stuffy (from allergies) ,  Did say he was working with a grinder and sanding a rusty table and only wearing his glasses.  Dr Smith Robert aware, ok to proceed,  Pt is going call his eye doctor after leaving here.

## 2023-06-19 ENCOUNTER — Other Ambulatory Visit: Payer: Self-pay | Admitting: Internal Medicine

## 2023-06-19 ENCOUNTER — Other Ambulatory Visit: Payer: Self-pay

## 2023-06-19 NOTE — Telephone Encounter (Signed)
 Last filled 04-21-23 #30 Last OV 04-21-23 No Future OV [facility]

## 2023-06-20 ENCOUNTER — Emergency Department

## 2023-06-20 ENCOUNTER — Other Ambulatory Visit: Payer: Self-pay

## 2023-06-20 ENCOUNTER — Emergency Department
Admission: EM | Admit: 2023-06-20 | Discharge: 2023-06-20 | Disposition: A | Discharge status: Home / Self Care | Attending: Emergency Medicine | Admitting: Emergency Medicine

## 2023-06-20 DIAGNOSIS — R519 Headache, unspecified: Secondary | ICD-10-CM | POA: Diagnosis not present

## 2023-06-20 DIAGNOSIS — Z7901 Long term (current) use of anticoagulants: Secondary | ICD-10-CM | POA: Diagnosis not present

## 2023-06-20 DIAGNOSIS — S199XXA Unspecified injury of neck, initial encounter: Secondary | ICD-10-CM | POA: Diagnosis present

## 2023-06-20 DIAGNOSIS — S161XXA Strain of muscle, fascia and tendon at neck level, initial encounter: Secondary | ICD-10-CM | POA: Insufficient documentation

## 2023-06-20 DIAGNOSIS — Y92410 Unspecified street and highway as the place of occurrence of the external cause: Secondary | ICD-10-CM | POA: Diagnosis not present

## 2023-06-20 DIAGNOSIS — M542 Cervicalgia: Secondary | ICD-10-CM | POA: Diagnosis not present

## 2023-06-20 DIAGNOSIS — S0990XA Unspecified injury of head, initial encounter: Secondary | ICD-10-CM | POA: Diagnosis not present

## 2023-06-20 MED ORDER — ACETAMINOPHEN 500 MG PO TABS
1000.0000 mg | ORAL_TABLET | Freq: Once | ORAL | Status: AC
  Administered 2023-06-20: 1000 mg via ORAL
  Filled 2023-06-20: qty 2

## 2023-06-20 MED ORDER — METHOCARBAMOL 500 MG PO TABS
1000.0000 mg | ORAL_TABLET | Freq: Once | ORAL | Status: AC
  Administered 2023-06-20: 1000 mg via ORAL
  Filled 2023-06-20: qty 2

## 2023-06-20 NOTE — Discharge Instructions (Signed)
 CT imaging of your head and neck showed no acute injuries today.  There was an old finding of possibly calcified bulging disc in her cervical spine.  Since he did not have any symptoms before today and it is not a new injury, I likely suspect that is not a significant issue.  You can follow-up with your primary care provider as needed for that.
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
## 2023-06-23 ENCOUNTER — Inpatient Hospital Stay

## 2023-06-23 ENCOUNTER — Telehealth: Payer: Self-pay

## 2023-06-23 VITALS — BP 136/72 | HR 82 | Temp 97.2°F | Resp 18

## 2023-06-23 DIAGNOSIS — N184 Chronic kidney disease, stage 4 (severe): Secondary | ICD-10-CM | POA: Diagnosis not present

## 2023-06-23 DIAGNOSIS — D631 Anemia in chronic kidney disease: Secondary | ICD-10-CM | POA: Diagnosis not present

## 2023-06-23 DIAGNOSIS — D508 Other iron deficiency anemias: Secondary | ICD-10-CM

## 2023-06-23 MED ORDER — HEPARIN SOD (PORK) LOCK FLUSH 100 UNIT/ML IV SOLN
250.0000 [IU] | Freq: Once | INTRAVENOUS | Status: DC | PRN
Start: 2023-06-23 — End: 2023-06-23
  Filled 2023-06-23: qty 5

## 2023-06-23 MED ORDER — ALTEPLASE 2 MG IJ SOLR
2.0000 mg | Freq: Once | INTRAMUSCULAR | Status: DC | PRN
Start: 2023-06-23 — End: 2023-06-23
  Filled 2023-06-23: qty 2

## 2023-06-23 MED ORDER — IRON SUCROSE 20 MG/ML IV SOLN
200.0000 mg | INTRAVENOUS | Status: DC
  Administered 2023-06-23: 200 mg via INTRAVENOUS

## 2023-06-23 MED ORDER — HEPARIN SOD (PORK) LOCK FLUSH 100 UNIT/ML IV SOLN
500.0000 [IU] | Freq: Once | INTRAVENOUS | Status: DC | PRN
  Filled 2023-06-23: qty 5

## 2023-06-23 NOTE — Telephone Encounter (Signed)
 Called pt back to make him aware that we are unable to fill the form out as is. The disability form is signed by another physician under attending physician (Dr. Alphonsus Sias). The pt states that Dr. Alphonsus Sias said he couldn't fill out the back portion of the disability form. Advised that we could not fill that part out either because it is the "attending physician statement". Told the pt that our office would fill out another form if they are able to get another copy.

## 2023-06-23 NOTE — Telephone Encounter (Signed)
 Pt to office and picked up disability form. Advised if pt brought back a empty form for [name], we would be able to fill it out.

## 2023-06-23 NOTE — Patient Instructions (Signed)

## 2023-06-24 ENCOUNTER — Encounter: Payer: Self-pay | Admitting: Internal Medicine

## 2023-06-24 ENCOUNTER — Other Ambulatory Visit: Payer: Self-pay

## 2023-06-24 ENCOUNTER — Ambulatory Visit (INDEPENDENT_AMBULATORY_CARE_PROVIDER_SITE_OTHER): Admitting: Internal Medicine

## 2023-06-24 VITALS — BP 112/82 | HR 78 | Temp 97.7°F | Wt 250.0 lb

## 2023-06-24 DIAGNOSIS — R197 Diarrhea, unspecified: Secondary | ICD-10-CM

## 2023-06-24 DIAGNOSIS — S134XXD Sprain of ligaments of cervical spine, subsequent encounter: Secondary | ICD-10-CM | POA: Diagnosis not present

## 2023-06-24 MED ORDER — TIZANIDINE HCL 2 MG PO TABS
2.0000 mg | ORAL_TABLET | Freq: Four times a day (QID) | ORAL | 1 refills | Status: DC | PRN
  Filled 2023-06-24: qty 30, 8d supply, fill #0

## 2023-06-24 NOTE — Assessment & Plan Note (Addendum)
 CT reassuring Okay to continue tylenol, heat Robaxin helped--but will fill tizanidine since safer

## 2023-06-24 NOTE — Assessment & Plan Note (Signed)
 Over the past month Pattern typical of IBS--but never had it before Will have him hold the colchicine for a few doses Consider prn levsin--- or imodium--before eating out
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
## 2023-06-25 ENCOUNTER — Other Ambulatory Visit: Payer: Self-pay

## 2023-06-25 DIAGNOSIS — D508 Other iron deficiency anemias: Secondary | ICD-10-CM | POA: Diagnosis not present

## 2023-06-25 DIAGNOSIS — E559 Vitamin D deficiency, unspecified: Secondary | ICD-10-CM | POA: Diagnosis not present

## 2023-06-25 DIAGNOSIS — Z5181 Encounter for therapeutic drug level monitoring: Secondary | ICD-10-CM | POA: Diagnosis not present

## 2023-06-25 DIAGNOSIS — N184 Chronic kidney disease, stage 4 (severe): Secondary | ICD-10-CM | POA: Diagnosis not present

## 2023-06-25 DIAGNOSIS — Z940 Kidney transplant status: Secondary | ICD-10-CM | POA: Diagnosis not present

## 2023-06-25 DIAGNOSIS — Z4822 Encounter for aftercare following kidney transplant: Secondary | ICD-10-CM | POA: Diagnosis not present

## 2023-06-25 DIAGNOSIS — Z79621 Long term (current) use of calcineurin inhibitor: Secondary | ICD-10-CM | POA: Diagnosis not present

## 2023-06-25 DIAGNOSIS — I129 Hypertensive chronic kidney disease with stage 1 through stage 4 chronic kidney disease, or unspecified chronic kidney disease: Secondary | ICD-10-CM | POA: Diagnosis not present

## 2023-06-25 DIAGNOSIS — E876 Hypokalemia: Secondary | ICD-10-CM | POA: Diagnosis not present

## 2023-06-25 DIAGNOSIS — N2581 Secondary hyperparathyroidism of renal origin: Secondary | ICD-10-CM | POA: Diagnosis not present

## 2023-06-25 DIAGNOSIS — I10 Essential (primary) hypertension: Secondary | ICD-10-CM | POA: Diagnosis not present

## 2023-06-25 DIAGNOSIS — D509 Iron deficiency anemia, unspecified: Secondary | ICD-10-CM | POA: Diagnosis not present

## 2023-06-25 DIAGNOSIS — D849 Immunodeficiency, unspecified: Secondary | ICD-10-CM | POA: Diagnosis not present

## 2023-06-25 DIAGNOSIS — D84821 Immunodeficiency due to drugs: Secondary | ICD-10-CM | POA: Diagnosis not present

## 2023-06-25 DIAGNOSIS — M1A9XX0 Chronic gout, unspecified, without tophus (tophi): Secondary | ICD-10-CM | POA: Diagnosis not present

## 2023-06-25 MED ORDER — AMOXICILLIN 500 MG PO CAPS
2000.0000 mg | ORAL_CAPSULE | ORAL | 5 refills | Status: DC | PRN
  Filled 2023-06-25: qty 4, 1d supply, fill #0

## 2023-06-26 DIAGNOSIS — M4722 Other spondylosis with radiculopathy, cervical region: Secondary | ICD-10-CM | POA: Diagnosis not present

## 2023-06-27 ENCOUNTER — Other Ambulatory Visit: Payer: Self-pay

## 2023-06-27 DIAGNOSIS — M542 Cervicalgia: Secondary | ICD-10-CM | POA: Diagnosis not present

## 2023-06-27 MED ORDER — TRAMADOL HCL 50 MG PO TABS
50.0000 mg | ORAL_TABLET | Freq: Four times a day (QID) | ORAL | 0 refills | Status: DC | PRN
  Filled 2023-06-27: qty 20, 5d supply, fill #0

## 2023-06-27 MED ORDER — METHYLPREDNISOLONE 4 MG PO TBPK
ORAL_TABLET | ORAL | 0 refills | Status: DC
Start: 2023-06-26 — End: 2023-07-29
  Filled 2023-06-27: qty 21, 6d supply, fill #0

## 2023-06-30 ENCOUNTER — Other Ambulatory Visit: Payer: Self-pay

## 2023-07-01 DIAGNOSIS — Z794 Long term (current) use of insulin: Secondary | ICD-10-CM | POA: Diagnosis not present

## 2023-07-01 DIAGNOSIS — N184 Chronic kidney disease, stage 4 (severe): Secondary | ICD-10-CM | POA: Diagnosis not present

## 2023-07-01 DIAGNOSIS — E1142 Type 2 diabetes mellitus with diabetic polyneuropathy: Secondary | ICD-10-CM | POA: Diagnosis not present

## 2023-07-01 DIAGNOSIS — Z940 Kidney transplant status: Secondary | ICD-10-CM | POA: Diagnosis not present

## 2023-07-01 DIAGNOSIS — I10 Essential (primary) hypertension: Secondary | ICD-10-CM | POA: Diagnosis not present

## 2023-07-01 DIAGNOSIS — E1122 Type 2 diabetes mellitus with diabetic chronic kidney disease: Secondary | ICD-10-CM | POA: Diagnosis not present

## 2023-07-01 DIAGNOSIS — E1159 Type 2 diabetes mellitus with other circulatory complications: Secondary | ICD-10-CM | POA: Diagnosis not present

## 2023-07-02 DIAGNOSIS — M542 Cervicalgia: Secondary | ICD-10-CM | POA: Diagnosis not present

## 2023-07-04 ENCOUNTER — Other Ambulatory Visit: Payer: Self-pay

## 2023-07-04 MED ORDER — AMOXICILLIN 500 MG PO CAPS
2000.0000 mg | ORAL_CAPSULE | Freq: Every day | ORAL | 5 refills | Status: DC
  Filled 2023-07-04: qty 4, 1d supply, fill #0

## 2023-07-08 DIAGNOSIS — M542 Cervicalgia: Secondary | ICD-10-CM | POA: Diagnosis not present

## 2023-07-09 DIAGNOSIS — I10 Essential (primary) hypertension: Secondary | ICD-10-CM | POA: Diagnosis not present

## 2023-07-09 DIAGNOSIS — E559 Vitamin D deficiency, unspecified: Secondary | ICD-10-CM | POA: Diagnosis not present

## 2023-07-09 DIAGNOSIS — M542 Cervicalgia: Secondary | ICD-10-CM | POA: Diagnosis not present

## 2023-07-09 DIAGNOSIS — Z940 Kidney transplant status: Secondary | ICD-10-CM | POA: Diagnosis not present

## 2023-07-09 DIAGNOSIS — Z7960 Long term (current) use of unspecified immunomodulators and immunosuppressants: Secondary | ICD-10-CM | POA: Diagnosis not present

## 2023-07-10 DIAGNOSIS — M542 Cervicalgia: Secondary | ICD-10-CM | POA: Diagnosis not present

## 2023-07-14 ENCOUNTER — Other Ambulatory Visit: Payer: Self-pay

## 2023-07-14 ENCOUNTER — Ambulatory Visit: Payer: Self-pay | Admitting: Surgery

## 2023-07-14 DIAGNOSIS — I4811 Longstanding persistent atrial fibrillation: Secondary | ICD-10-CM | POA: Diagnosis not present

## 2023-07-14 DIAGNOSIS — D849 Immunodeficiency, unspecified: Secondary | ICD-10-CM | POA: Diagnosis not present

## 2023-07-14 DIAGNOSIS — Z860101 Personal history of adenomatous and serrated colon polyps: Secondary | ICD-10-CM | POA: Diagnosis not present

## 2023-07-14 DIAGNOSIS — Z7901 Long term (current) use of anticoagulants: Secondary | ICD-10-CM | POA: Diagnosis not present

## 2023-07-14 DIAGNOSIS — A630 Anogenital (venereal) warts: Secondary | ICD-10-CM | POA: Diagnosis not present

## 2023-07-14 MED ORDER — FLUOROURACIL 5 % EX CREA
TOPICAL_CREAM | Freq: Two times a day (BID) | CUTANEOUS | 2 refills | Status: DC
Start: 2023-07-14 — End: 2023-08-15
  Filled 2023-07-14: qty 40, 30d supply, fill #0

## 2023-07-15 DIAGNOSIS — M542 Cervicalgia: Secondary | ICD-10-CM | POA: Diagnosis not present

## 2023-07-17 ENCOUNTER — Telehealth: Admitting: Internal Medicine

## 2023-07-17 ENCOUNTER — Encounter: Payer: Self-pay | Admitting: Internal Medicine

## 2023-07-17 ENCOUNTER — Other Ambulatory Visit: Payer: Self-pay

## 2023-07-17 VITALS — BP 136/83 | Ht 70.0 in | Wt 239.0 lb

## 2023-07-17 DIAGNOSIS — J019 Acute sinusitis, unspecified: Secondary | ICD-10-CM | POA: Insufficient documentation

## 2023-07-17 DIAGNOSIS — M542 Cervicalgia: Secondary | ICD-10-CM | POA: Diagnosis not present

## 2023-07-17 MED ORDER — BENZONATATE 200 MG PO CAPS
200.0000 mg | ORAL_CAPSULE | Freq: Three times a day (TID) | ORAL | 0 refills | Status: DC | PRN
  Filled 2023-07-17: qty 60, 20d supply, fill #0

## 2023-07-17 MED ORDER — AMOXICILLIN-POT CLAVULANATE 875-125 MG PO TABS
1.0000 | ORAL_TABLET | Freq: Two times a day (BID) | ORAL | 0 refills | Status: DC
  Filled 2023-07-17: qty 14, 7d supply, fill #0

## 2023-07-17 NOTE — Assessment & Plan Note (Signed)
 Seems to be isolated in his head Multiple comorbidities so will try empiric Rx now--augmentin  875 bid x 7 days Discussed analgesics Refill benzonatate 

## 2023-07-17 NOTE — Progress Notes (Signed)
 Subjective:    Patient ID: [name]., male    DOB: 1960/10/28, 63 y.o.   MRN: [id number]  HPI Video virtual visit due to respiratory illness Identification done Reviewed limitations and billing and he gave consent Participants--patient in his home and I am in my office  Started 3 days ago with runny nose Then moved to throat Now in chest for the past 2 days Cough with yellow phlegm No fever, sweats--but some chills No SOB  Feels very weak No headache Some post nasal drip---making his throat sore No ear pain  Tried mucinex --no real help Left over benzonatate  did help Sleeping better in recliner---cough worse if flat  Current Outpatient Medications on File Prior to Visit  Medication Sig Dispense Refill   albuterol  (PROVENTIL ) (2.5 MG/3ML) 0.083% nebulizer solution Take 3 mLs (2.5 mg total) by nebulization every 6 (six) hours as needed for wheezing or shortness of breath. 150 mL 0   albuterol  (VENTOLIN  HFA) 108 (90 Base) MCG/ACT inhaler Inhale 2 puffs into the lungs every 6 (six) hours as needed for wheezing or shortness of breath. 6.7 g 5   amLODipine  (NORVASC ) 5 MG tablet Take 5 mg by mouth daily.     amoxicillin  (AMOXIL ) 500 MG capsule Take 4 capsules (2,000 mg total) by mouth 1 hour before dental appt 4 capsule 5   amoxicillin  (AMOXIL ) 500 MG capsule Take 4 capsules (2,000 mg total) by mouth daily. Take one hour before dental appointment. 4 capsule 5   apixaban  (ELIQUIS ) 5 MG TABS tablet Take 1 tablet (5 mg total) by mouth 2 (two) times daily. 180 tablet 3   buPROPion  (WELLBUTRIN  SR) 150 MG 12 hr tablet Take 1 tablet (150 mg total) by mouth 2 (two) times daily. 180 tablet 3   cholecalciferol  (VITAMIN D3) 25 MCG (1000 UNIT) tablet Take 1,000 Units by mouth at bedtime.     colchicine  0.6 MG tablet Take 1 tablet (0.6 mg total) by mouth every other day. 45 tablet 1   Continuous Glucose Sensor (FREESTYLE LIBRE 3 SENSOR) MISC Use one every 2 weeks. 6 each 3    dapagliflozin  propanediol (FARXIGA ) 10 MG TABS tablet Take 1 tablet (10 mg total) by mouth daily before breakfast. 90 tablet 3   febuxostat  (ULORIC ) 40 MG tablet Take 3 tablets (120 mg total) by mouth daily. 90 tablet 5   fluorouracil  (EFUDEX ) 5 % cream Apply topically 2 (two) times daily for 60 days Apply to anus starting after laser surgery for 8 weeks to prevent recurrent lesions 40 g 2   fluticasone  (FLONASE ) 50 MCG/ACT nasal spray Place 2 sprays into both nostrils daily. 16 g 11   gabapentin  (NEURONTIN ) 300 MG capsule Take 1 capsule (300 mg total) by mouth 2 (two) times daily. 180 capsule 3   HYDROcodone -acetaminophen  (NORCO) 10-325 MG tablet Take 1 tablet by mouth every 4 (four) hours as needed.     hydrocortisone  2.5 % cream Apply topically 3 (three) times daily as needed. 30 g 3   Insulin  Glargine (SEMGLEE  [place]) Inject 40 Units into the skin daily.     insulin  glargine-yfgn (SEMGLEE ) 100 UNIT/ML Pen Inject 20 Units into the skin daily. 45 mL 3   insulin  lispro (HUMALOG  KWIKPEN) 100 UNIT/ML KwikPen Inject 10-20 Units into the skin 3 (three) times daily. Per sliding scale     Insulin  Pen Needle (TECHLITE PEN NEEDLES) 31G X 5 MM MISC 4 (four) times daily. 400 each 3   Insulin  Pen Needle (TECHLITE PEN NEEDLES) 32G  X 6 MM MISC Use 4 times a day 100 each 3   Insulin  Pen Needle (UNIFINE PENTIPS) 31G X 5 MM MISC Use 4 times daily as directed 400 each 3   losartan  (COZAAR ) 25 MG tablet Take 1 tablet (25 mg total) by mouth daily. 90 tablet 3   methylPREDNISolone  (MEDROL ) 4 MG TBPK tablet Take 6 tablets by mouth on day 1, then decrease by one tablet each day 21 tablet 0   montelukast  (SINGULAIR ) 10 MG tablet Take 1 tablet (10 mg total) by mouth at bedtime. 30 tablet 11   Multiple Vitamin (MULTIVITAMIN WITH MINERALS) TABS tablet Take 1 tablet by mouth daily.     mycophenolate  (MYFORTIC ) 180 MG EC tablet Take 2 tablets (360 mg total) by mouth every morning AND 1 tablet (180 mg total) every evening. 360  tablet 3   ofloxacin  (OCUFLOX ) 0.3 % ophthalmic solution Instill 1 drop into both eyes three times a day 5 mL 0   omeprazole  (PRILOSEC) 20 MG capsule Take 1 capsule (20 mg total) by mouth daily. 30 capsule 11   potassium chloride  SA (KLOR-CON  M) 20 MEQ tablet Take 3 tablets (60 mEq total) by mouth 2 (two) times daily. 180 tablet 1   predniSONE  (DELTASONE ) 5 MG tablet Take 1 tablet (5 mg total) by mouth daily. 90 tablet 3   rosuvastatin  (CRESTOR ) 10 MG tablet Take 1 tablet (10 mg total) by mouth daily. 90 tablet 3   sulfamethoxazole -trimethoprim  (BACTRIM ) 400-80 MG tablet Take 1 tablet by mouth 3 (three) times a week. 36 tablet 3   tacrolimus  (PROGRAF ) 1 MG capsule Take 2 capsules (2 mg total) by mouth every morning AND 1 capsule (1 mg total) every evening. 90 capsule 11   tamsulosin  (FLOMAX ) 0.4 MG CAPS capsule Take 1 capsule (0.4 mg total) by mouth daily. 30 capsule 11   Testosterone  20.25 MG/ACT (1.62%) GEL Place 2 Pump onto the skin daily. 150 g 3   tirzepatide  (MOUNJARO ) 5 MG/0.5ML Pen Inject 5 mg into the skin once a week. 2 mL 5   tiZANidine  (ZANAFLEX ) 2 MG tablet Take 1 tablet (2 mg total) by mouth every 6 (six) hours as needed for muscle spasms. 30 tablet 1   torsemide  (DEMADEX ) 20 MG tablet Take 4 tablets (80 mg total) by mouth 2 (two) times daily. 240 tablet 3   traMADol  (ULTRAM ) 50 MG tablet Take 1 tablet (50 mg total) by mouth every 6 (six) hours as needed for pain. 20 tablet 0   Zinc  50 MG TABS Take 1 tablet (50 mg total) by mouth daily. 100 tablet 3   No current facility-administered medications on file prior to visit.    Allergies  Allergen Reactions   Contrast Media [Iodinated Contrast Media] Other (See Comments)    Kidney transplant   Iodine Other (See Comments)    Other reaction(s): Other (See Comments) Kidney transplant   Nsaids Other (See Comments)    Kidney transplant   Ibuprofen Other (See Comments)    Due to kidney transplant Due to kidney transplant Due to kidney  transplant    Past Medical History:  Diagnosis Date   Anal condyloma    Anemia    Aortic atherosclerosis (HCC)    Aortic stenosis    a.) TTE 10/2021: Mod AS. AVA 1.06cm^2 (VTI). Mean grad ; b.) TTE 04/02/2022: no AV stenosis   Asthma, persistent controlled 02/25/2013   Attention or concentration deficit 04/26/2021   BPH with obstruction/lower urinary tract symptoms 09/25/2014   CAD (  coronary artery disease) 2005   a.) LHC 1996 -> HG stenosis mLAD -> PTCA/PCI with stent x 1 (unk type) -> complicated by CFA pseudoanurysm (required surg);  b.) LHC 08/10/2002  -> 95% LAD, 70% ISR LAD, 80% pOM, 70% mRCA --> CVTS consult; c.) 4v CABG 08/13/2002; c.) 03/2018 MV: Small, fixed inferoapical and apical sep defect. No isc. EF 47% (60-65% by 05/2018 TTE); d.) 02/2021 MV: small Apical inf fixed defect. No isc -> low risk.   Cardiomegaly    Cellulitis and abscess of left leg 07/22/2020   Chronic atrial fibrillation    a.) CHA2DS2VASc = 4 (CHF, HTN, vascular disease history, T2DM);  b.) rate/rhythm maintained without pharmacological intervention; chronically anticoagulated with apixaban    Chronic combined systolic and diastolic CHF (congestive heart failure)    a.) TTE 7/18 : EF 45-50%; b.) TTE 05/2018 : EF 60-65%, RVSP 74.8; c.) TTE 12/2018: EF 50-55%. Sev dil LA; d.) TTE 04/2019: EF 45-50%; e.) TTE 07/2021: EF 40-45%, G3DD; f.) TTE 10/2021: EF 40-45%, mild conc LVH, septal-lat dyssynchrony (LBBB), mildly red RVSF, mild-mod dil LA, mildly dil RA, mild-mod MS, mod AS; g.) TTE 04/02/2022: EF 40-45%, glob HK, LVH, red RVSF, RVE, sev LAE, mild-mod RAE, mild MR   Chronic venous stasis dermatitis of both lower extremities    Cytomegaloviral disease 2017   Diverticulosis of colon 04/22/2013   Dyspnea    Elevated RIGHT hemidiaphragm    Erectile dysfunction    a.) on topical TRT + Trimix (alprostadil/papaverine/phentolamine) injections   ESRD (end stage renal disease) (HCC)    a.) dialysis dependent 2004-2012;  b.) s/p cadaveric RIGHT renal transplant 09/13/2010   Essential hypertension 12/17/2010   GERD (gastroesophageal reflux disease)    Gout    History of 2019 novel coronavirus disease (COVID-19) 06/30/2019   History of bilateral cataract extraction 10/2017   History of renal transplant 09/13/2010   a.) s/p cadaveric donor transplant 09/13/2010   Hx of bilateral cataract extraction 10/2017   Hyperlipidemia    Hypogonadism in male 09/25/2014   Ischemic cardiomyopathy    Left cephalic vein thrombosis 06/2019   Long term current use of anticoagulant    a.) apixaban    Long term current use of immunosuppressive drug    a.) mycophenolate  + prednisone  + tacrolimus    Major depressive disorder 10/04/2013   Mitral stenosis 11/07/2021   a.) TTE 11/07/2021: severe MAC, mild-mod MS (MPG 6 mmHg); b.) TTE 04/02/2022: no MV stenosis   Murmur    Obesity hypoventilation syndrome 03/31/2012   OSA treated with BiPAP 09/29/2012   PAH (pulmonary artery hypertension)    a. 04/2019 RHC: RA 19, RV 80/20, PA 78/31 (51), PCWP 25, CO/CI 7.69/3.26. PVR 3.25 --> Sev PAH, likely primarily PV HTN; b.) RHC 04/04/2022: mRA 13, mPA 40, mPCWP 20, PA sat 59, AO sat 92, CO 7.63, CI 3.26, PVR 2.6 --> mod portal venous HTN   Pancreatitis    S/P CABG x 4 08/13/2002   a.) LIMA-LAD, LRA-OM, SVG-D1, SVG-dRCA   S/P gastric bypass    Synovitis of finger 06/11/2019   Trigger finger, unspecified little finger 12/23/2018   Type 2 diabetes mellitus with hyperglycemia, with long-term current use of insulin  09/09/2014   a.) has FreeStyle Libre CGM   Ulcer    Left shin goes to wound care center at [facility] qweek   Wears glasses    Wears hearing aid in both ears     Past Surgical History:  Procedure Laterality Date   CARDIAC CATHETERIZATION N/A  08/10/2002   CATARACT EXTRACTION W/PHACO Right 10/29/2017   Procedure: CATARACT EXTRACTION PHACO AND INTRAOCULAR LENS PLACEMENT (IOC)  RIGHT DIABETIC;  Surgeon: Annell Kidney, MD;   Location: [facility];  Service: Ophthalmology;  Laterality: Right   CATARACT EXTRACTION W/PHACO Left 11/18/2017   Procedure: CATARACT EXTRACTION PHACO AND INTRAOCULAR LENS PLACEMENT (IOC) LEFT IVA/TOPICAL;  Surgeon: Annell Kidney, MD;  Location: [facility];  Service: Ophthalmology;  Laterality: Left   COLONOSCOPY WITH PROPOFOL  N/A 04/22/2013   Procedure: COLONOSCOPY WITH PROPOFOL ;  Surgeon: Janel Medford, MD;  Location: [facility];  Service: Endoscopy;  Laterality: N/A;   CORONARY ANGIOPLASTY WITH STENT PLACEMENT N/A 1996   CORONARY ARTERY BYPASS GRAFT N/A 08/13/2002   Procedure: 4v CORONARY ARTERY BYPASS GRAFTING; Location: [facility]; Surgeon: Ruffus Couch, MD   CYSTOSCOPY WITH INSERTION OF UROLIFT N/A 12/25/2021   Procedure: CYSTOSCOPY WITH INSERTION OF UROLIFT;  Surgeon: Geraline Knapp, MD;  Location: [facility];  Service: Urology;  Laterality: N/A;   DG ANGIO AV SHUNT*L*     right and left upper arms   FASCIOTOMY  03/03/2012   Procedure: FASCIOTOMY;  Surgeon: Kemp Patter, MD;  Location: [facility];  Service: Orthopedics;  Laterality: Right;  FASCIOTOMY RIGHT SMALL FINGER   FASCIOTOMY Left 08/17/2013   Procedure: FASCIOTOMY LEFT RING;  Surgeon: Kemp Patter, MD;  Location: [facility];  Service: Orthopedics;  Laterality: Left;   INCISION AND DRAINAGE ABSCESS Left 10/15/2015   Procedure: INCISION AND DRAINAGE ABSCESS;  Surgeon: Alben Alma, MD;  Location: [facility];  Service: General;  Laterality: Left;   KIDNEY TRANSPLANT  09/13/2010   cadaver--at [facility]   LASER ABLATION CONDOLAMATA N/A 01/08/2023   Procedure: LASER REMOVAL ABLATION OF CONDYLOMATA;  Surgeon: Candyce Champagne, MD;  Location: [facility];  Service: General;  Laterality: N/A;  GEN AND LOCAL   RECTAL EXAM UNDER ANESTHESIA N/A 01/08/2023   Procedure: ANORECTAL EXAM UNDER ANESTHESIA;  Surgeon: Candyce Champagne, MD;  Location: [facility];  Service: General;  Laterality: N/A;   RIGHT  HEART CATH N/A 11/15/2016   Procedure: RIGHT HEART CATH;  Surgeon: Wenona Hamilton, MD;  Location: [facility];  Service: Cardiovascular;  Laterality: N/A;   RIGHT HEART CATH N/A 05/03/2019   Procedure: RIGHT HEART CATH;  Surgeon: Darlis Eisenmenger, MD;  Location: [facility] CV [facility];  Service: Cardiovascular;  Laterality: N/A;   RIGHT HEART CATH N/A 04/04/2022   Procedure: RIGHT HEART CATH;  Surgeon: Darlis Eisenmenger, MD;  Location: [facility] INVASIVE CV LAB;  Service: Cardiovascular;  Laterality: N/A;   ROUX-EN-Y GASTRIC BYPASS N/A 2015   TYMPANIC MEMBRANE REPAIR Left 03/2010   VASECTOMY     WART FULGURATION N/A 05/31/2022   Procedure: FULGURATION ANAL WART;  Surgeon: Conrado Delay, DO;  Location: [facility];  Service: General;  Laterality: N/A;    Family History  Problem Relation Age of Onset   Heart disease Father    Kidney failure Father    Kidney disease Father    Diabetes Maternal Grandmother    Breast cancer Maternal Grandmother    Valvular heart disease Mother    Liver cancer Paternal Uncle    Liver cancer Paternal Grandmother    Prostate cancer Neg Hx     Social History   Socioeconomic History   Marital status: Married    Spouse name: Not on file   Number of children: 2   Years of education: 14   Highest education level: Associate  degree: academic program  Occupational History   Occupation: Presenter, broadcasting: unemployed    Comment: disabled due to kidney failure  Tobacco Use   Smoking status: Former    Types: Cigars    Quit date: 09/02/1994    Years since quitting: 28.8    Passive exposure: Never   Smokeless tobacco: Never  Vaping Use   Vaping status: Never Used  Substance and Sexual Activity   Alcohol use: Not Currently    Comment: occ   Drug use: No   Sexual activity: Not on file  Other Topics Concern   Not on file  Social History Narrative   Has living will   Wife is health care POA---son Zoila Hines is alternate   Would accept  resuscitation attempts   No tube feedings if cognitively unaware   Social Drivers of Corporate investment banker Strain: Not on file  Food Insecurity: Low Risk  (06/25/2023)   Received from [facility]   Hunger Vital Sign    Worried About Running Out of Food in the Last Year: Never true    Ran Out of Food in the Last Year: Never true  Transportation Needs: No Transportation Needs (06/25/2023)   Received from [facility]    In the past 12 months, has lack of reliable transportation kept you from medical appointments, meetings, work or from getting things needed for daily living? : No  Physical Activity: Not on file  Stress: Not on file  Social Connections: Socially Integrated (04/11/2023)   Social Connection and Isolation Panel [NHANES]    Frequency of Communication with Friends and Family: More than three times a week    Frequency of Social Gatherings with Friends and Family: More than three times a week    Attends Religious Services: More than 4 times per year    Active Member of [employer] or Organizations: Yes    Attends Banker Meetings: More than 4 times per year    Marital Status: Married  Catering manager Violence: Not At Risk (04/11/2023)   Humiliation, Afraid, Rape, and Kick questionnaire    Fear of Current or Ex-Partner: No    Emotionally Abused: No    Physically Abused: No    Sexually Abused: No   Review of Systems No N/V Appetite is fair     Objective:   Physical Exam Constitutional:      Appearance: Normal appearance.     Comments: Some loose cough  Pulmonary:     Effort: Pulmonary effort is normal. No respiratory distress.  Neurological:     Mental Status: He is alert.            Assessment & Plan:

## 2023-07-18 ENCOUNTER — Other Ambulatory Visit: Payer: Self-pay

## 2023-07-18 DIAGNOSIS — M542 Cervicalgia: Secondary | ICD-10-CM | POA: Diagnosis not present

## 2023-07-23 ENCOUNTER — Other Ambulatory Visit (HOSPITAL_COMMUNITY): Payer: Self-pay | Admitting: Cardiology

## 2023-07-23 ENCOUNTER — Other Ambulatory Visit: Payer: Self-pay

## 2023-07-23 ENCOUNTER — Other Ambulatory Visit (HOSPITAL_COMMUNITY): Payer: Self-pay

## 2023-07-24 ENCOUNTER — Other Ambulatory Visit
Admission: RE | Admit: 2023-07-24 | Discharge: 2023-07-24 | Disposition: A | Discharge status: Home / Self Care | Source: Ambulatory Visit | Attending: Oculoplastics Ophthalmology | Admitting: Oculoplastics Ophthalmology

## 2023-07-24 DIAGNOSIS — M542 Cervicalgia: Secondary | ICD-10-CM | POA: Diagnosis not present

## 2023-07-24 DIAGNOSIS — G5132 Clonic hemifacial spasm, left: Secondary | ICD-10-CM | POA: Diagnosis not present

## 2023-07-24 DIAGNOSIS — G510 Bell's palsy: Secondary | ICD-10-CM | POA: Diagnosis not present

## 2023-07-24 DIAGNOSIS — H02402 Unspecified ptosis of left eyelid: Secondary | ICD-10-CM | POA: Diagnosis not present

## 2023-07-25 ENCOUNTER — Other Ambulatory Visit: Payer: Self-pay

## 2023-07-25 NOTE — Patient Instructions (Addendum)
 SURGICAL WAITING ROOM VISITATION  Patients having surgery or a procedure may have no more than 2 support people in the waiting area - these visitors may rotate.    Children under the age of 76 must have an adult with them who is not the patient.  Due to an increase in RSV and influenza rates and associated hospitalizations, children ages 93 and under may not visit patients in [facility] hospitals.  Visitors with respiratory illnesses are discouraged from visiting and should remain at home.  If the patient needs to stay at the hospital during part of their recovery, the visitor guidelines for inpatient rooms apply. Pre-op nurse will coordinate an appropriate time for 1 support person to accompany patient in pre-op.  This support person may not rotate.    Please refer to the [facility] website for the visitor guidelines for Inpatients (after your surgery is over and you are in a regular room).       Your procedure is scheduled on: 07/31/23   Report to [facility] Main Entrance    Report to admitting at 8:15 AM   Call this number if you have problems the morning of surgery [phone]   Do not eat food or drink liquids :After Midnight.But may have sips of water  with meds.       FOLLOW BOWEL PREP AND ANY ADDITIONAL PRE OP INSTRUCTIONS YOU RECEIVED FROM YOUR SURGEON'S OFFICE!!!     Oral Hygiene is also important to reduce your risk of infection.                                    Remember - BRUSH YOUR TEETH THE MORNING OF SURGERY WITH YOUR REGULAR TOOTHPASTE  DENTURES WILL BE REMOVED PRIOR TO SURGERY PLEASE DO NOT APPLY "Poly grip" OR ADHESIVES!!!   Stop all vitamins and herbal supplements 7 days before surgery.   Take these medicines the morning of surgery with A SIP OF WATER : amlodipine ,Wellbutrin , colchicine , uloric , gabapentin , singulair , myfortic , omeprazole , rosuvastatin , prograf , tamsulosin , demadex .               Do not take Losartan  the morning of surgery.                Hold Eliquis  for 2 days prior to surgery. Last dose ___________  DO NOT TAKE ANY ORAL DIABETIC MEDICATIONS DAY OF YOUR SURGERY  Do not take Farxiga  for 72 hours prior to surgery. Last dose to be _____________   Hold Mounjaro  for at least 7 days prior to surgery. Last dose __________  Bring CPAP mask and tubing day of surgery.                              You may not have any metal on your body including hair pins, jewelry, and body piercing             Do not wear make-up, lotions, powders, perfumes/cologne, or deodorant              Men may shave face and neck.   Do not bring valuables to the hospital. [facility] IS NOT             RESPONSIBLE   FOR VALUABLES.   Contacts, glasses, dentures or bridgework may not be worn into surgery.  DO NOT BRING YOUR HOME MEDICATIONS TO THE HOSPITAL. PHARMACY WILL DISPENSE  MEDICATIONS LISTED ON YOUR MEDICATION LIST TO YOU DURING YOUR ADMISSION IN THE HOSPITAL!    Patients discharged on the day of surgery will not be allowed to drive home.  Someone NEEDS to stay with you for the first 24 hours after anesthesia.   Special Instructions: Bring a copy of your healthcare power of attorney and living will documents the day of surgery if you haven't scanned them before.              Please read over the following fact sheets you were given: IF YOU HAVE QUESTIONS ABOUT YOUR PRE-OP INSTRUCTIONS PLEASE CALL [phone] Ammon Bales   If you received a COVID test during your pre-op visit  it is requested that you wear a mask when out in public, stay away from anyone that may not be feeling well and notify your surgeon if you develop symptoms. If you test positive for Covid or have been in contact with anyone that has tested positive in the last 10 days please notify you surgeon.    [place] - Preparing for Surgery Before surgery, you can play an important role.  Because skin is not sterile, your skin needs to be as free of germs as possible.  You can  reduce the number of germs on your skin by washing with CHG (chlorahexidine gluconate) soap before surgery.  CHG is an antiseptic cleaner which kills germs and bonds with the skin to continue killing germs even after washing. Please DO NOT use if you have an allergy to CHG or antibacterial soaps.  If your skin becomes reddened/irritated stop using the CHG and inform your nurse when you arrive at Short Stay. Do not shave (including legs and underarms) for at least 48 hours prior to the first CHG shower.  You may shave your face/neck.  Please follow these instructions carefully:  1.  Shower with CHG Soap the night before surgery and the  morning of surgery.  2.  If you choose to wash your hair, wash your hair first as usual with your normal  shampoo.  3.  After you shampoo, rinse your hair and body thoroughly to remove the shampoo.                             4.  Use CHG as you would any other liquid soap.  You can apply chg directly to the skin and wash.  Gently with a scrungie or clean washcloth.  5.  Apply the CHG Soap to your body ONLY FROM THE NECK DOWN.   Do   not use on face/ open                           Wound or open sores. Avoid contact with eyes, ears mouth and   genitals (private parts).                       Wash face,  Genitals (private parts) with your normal soap.             6.  Wash thoroughly, paying special attention to the area where your    surgery  will be performed.  7.  Thoroughly rinse your body with warm water  from the neck down.  8.  DO NOT shower/wash with your normal soap after using and rinsing off the CHG Soap.  9.  Pat yourself dry with a clean towel.            10.  Wear clean pajamas.            11.  Place clean sheets on your bed the night of your first shower and do not  sleep with pets. Day of Surgery : Do not apply any lotions/deodorants the morning of surgery.  Please wear clean clothes to the hospital/surgery center.  FAILURE TO FOLLOW THESE  INSTRUCTIONS MAY RESULT IN THE CANCELLATION OF YOUR SURGERY           DIABETES CONTROL FOR SURGERY. Why is it important to control my blood sugar before and after surgery? Improving blood sugar levels before and after surgery helps healing and can limit problems. A way of improving blood sugar control is eating a healthy diet by:  Eating less sugar and carbohydrates  Increasing activity/exercise  Talking with your doctor about reaching your blood sugar goals High blood sugars (greater than 180 mg/dL) can raise your risk of infections and slow your recovery, so you will need to focus on controlling your diabetes during the weeks before surgery. Make sure that the doctor who takes care of your diabetes knows about your planned surgery including the date and location.  How do I manage my blood sugar before surgery? Check your blood sugar at least 4 times a day, starting 2 days before surgery, to make sure that the level is not too high or low. Check your blood sugar the morning of your surgery when you wake up and every 2 hours until you get to the Short Stay unit. If your blood sugar is less than 70 mg/dL, you will need to treat for low blood sugar: Do not take insulin . Treat a low blood sugar (less than 70 mg/dL) with  cup of clear juice (cranberry or apple), 4 glucose tablets, OR glucose gel. Recheck blood sugar in 15 minutes after treatment (to make sure it is greater than 70 mg/dL). If your blood sugar is not greater than 70 mg/dL on recheck, call [phone] for further instructions. Report your blood sugar to the short stay nurse when you get to Short Stay.  If you are admitted to the hospital after surgery: Your blood sugar will be checked by the staff and you will probably be given insulin  after surgery (instead of oral diabetes medicines) to make sure you have good blood sugar levels. The goal for blood sugar control after surgery is 80-180 mg/dL.   WHAT DO I DO ABOUT MY DIABETES  MEDICATION?  Do not take oral diabetes medicines (pills) the morning of surgery.Hold Farxiga    THE MORNING OF SURGERY, Take only half of Glargine daily insulin  dose.  DO NOT TAKE THE FOLLOWING 7 DAYS PRIOR TO SURGERY: Ozempic, Wegovy, Rybelsus (Semaglutide), Byetta (exenatide), Bydureon (exenatide ER), Victoza, Saxenda (liraglutide), or Trulicity (dulaglutide) Mounjaro  (Tirzepatide ) Adlyxin (Lixisenatide), Polyethylene Glycol Loxenatide.  Patient Signature:  :

## 2023-07-25 NOTE — Progress Notes (Signed)
 COVID Vaccine received:  []  No [x]  Yes Date of any COVID positive Test in last 90 days: no PCP - Curt Dover MD Cardiologist - Aldine Amber MD Heart failure- Peder Bourdon MD  Chest x-ray - 04/08/23 Epic EKG - 05/02/23 Epic  Stress Test - 03/09/21 Epic ECHO - 03/25/23 Epic Cardiac Cath - 04/04/22  Bowel Prep - [x]  No  []   Yes ______  Pacemaker / ICD device [x]  No []  Yes   Spinal Cord Stimulator:[x]  No []  Yes       History of Sleep Apnea? []  No [x]  Yes   CPAP used?- []  No [x]  Yes    Does the patient monitor blood sugar?          []  No [x]  Yes  []  N/A  Patient has: []  NO Hx DM   []  Pre-DM                 []  DM1  []   DM2 Does patient have a [employer] or Dexacom? []  No [x]  Yes   Fasting Blood Sugar Ranges- 70's-80's Checks Blood Sugar _____ times a day  GLP1 agonist / usual dose - Mounjaro  last taken 07/28/23. GLP1 instructions:  SGLT-2 inhibitors / usual dose - no SGLT-2 instructions:   Blood Thinner / Instructions:Eliquis - Last dose 07/28/23 at 7pm. Aspirin  Instructions:no  Comments:   Activity level: Patient is able  to climb a flight of stairs without difficulty; [x]  No CP  [x]  No SOB,  Patient can  perform ADLs without assistance.   Anesthesia review: CAD w/ CABG 2005, HTN,OSA,Renal transplant, CHF, DM, took Mounjaro  07/28/23.  Patient denies shortness of breath, fever, cough and chest pain at PAT appointment.  Patient verbalized understanding and agreement to the Pre-Surgical Instructions that were given to them at this PAT appointment. Patient was also educated of the need to review these PAT instructions again prior to his/her surgery.I reviewed the appropriate phone numbers to call if they have any and questions or concerns.

## 2023-07-28 ENCOUNTER — Encounter (HOSPITAL_COMMUNITY)
Admission: RE | Admit: 2023-07-28 | Discharge: 2023-07-28 | Disposition: A | Discharge status: Home / Self Care | Source: Ambulatory Visit | Attending: Surgery | Admitting: Surgery

## 2023-07-28 ENCOUNTER — Encounter (HOSPITAL_COMMUNITY): Payer: Self-pay

## 2023-07-28 ENCOUNTER — Other Ambulatory Visit: Payer: Self-pay

## 2023-07-28 ENCOUNTER — Encounter (HOSPITAL_COMMUNITY): Payer: Self-pay | Admitting: Physician Assistant

## 2023-07-28 VITALS — BP 170/88 | HR 82 | Temp 97.5°F | Resp 16 | Ht 70.0 in | Wt 240.0 lb

## 2023-07-28 DIAGNOSIS — E119 Type 2 diabetes mellitus without complications: Secondary | ICD-10-CM | POA: Diagnosis not present

## 2023-07-28 DIAGNOSIS — Z794 Long term (current) use of insulin: Secondary | ICD-10-CM | POA: Insufficient documentation

## 2023-07-28 DIAGNOSIS — Z01812 Encounter for preprocedural laboratory examination: Secondary | ICD-10-CM | POA: Insufficient documentation

## 2023-07-29 ENCOUNTER — Telehealth: Payer: Self-pay

## 2023-07-29 ENCOUNTER — Other Ambulatory Visit: Payer: Self-pay

## 2023-07-29 ENCOUNTER — Encounter: Payer: Self-pay | Admitting: Cardiology

## 2023-07-29 ENCOUNTER — Ambulatory Visit: Attending: Cardiology | Admitting: Cardiology

## 2023-07-29 VITALS — BP 134/61 | HR 61 | Wt 248.0 lb

## 2023-07-29 DIAGNOSIS — I272 Pulmonary hypertension, unspecified: Secondary | ICD-10-CM | POA: Diagnosis not present

## 2023-07-29 DIAGNOSIS — I132 Hypertensive heart and chronic kidney disease with heart failure and with stage 5 chronic kidney disease, or end stage renal disease: Secondary | ICD-10-CM | POA: Insufficient documentation

## 2023-07-29 DIAGNOSIS — R001 Bradycardia, unspecified: Secondary | ICD-10-CM | POA: Diagnosis not present

## 2023-07-29 DIAGNOSIS — Z7985 Long-term (current) use of injectable non-insulin antidiabetic drugs: Secondary | ICD-10-CM | POA: Diagnosis not present

## 2023-07-29 DIAGNOSIS — Z87891 Personal history of nicotine dependence: Secondary | ICD-10-CM | POA: Insufficient documentation

## 2023-07-29 DIAGNOSIS — I10 Essential (primary) hypertension: Secondary | ICD-10-CM | POA: Diagnosis not present

## 2023-07-29 DIAGNOSIS — I2510 Atherosclerotic heart disease of native coronary artery without angina pectoris: Secondary | ICD-10-CM | POA: Insufficient documentation

## 2023-07-29 DIAGNOSIS — E1122 Type 2 diabetes mellitus with diabetic chronic kidney disease: Secondary | ICD-10-CM | POA: Insufficient documentation

## 2023-07-29 DIAGNOSIS — I252 Old myocardial infarction: Secondary | ICD-10-CM | POA: Insufficient documentation

## 2023-07-29 DIAGNOSIS — Z794 Long term (current) use of insulin: Secondary | ICD-10-CM | POA: Insufficient documentation

## 2023-07-29 DIAGNOSIS — Z79899 Other long term (current) drug therapy: Secondary | ICD-10-CM | POA: Insufficient documentation

## 2023-07-29 DIAGNOSIS — D631 Anemia in chronic kidney disease: Secondary | ICD-10-CM

## 2023-07-29 DIAGNOSIS — Z79624 Long term (current) use of inhibitors of nucleotide synthesis: Secondary | ICD-10-CM | POA: Insufficient documentation

## 2023-07-29 DIAGNOSIS — N1832 Chronic kidney disease, stage 3b: Secondary | ICD-10-CM | POA: Diagnosis not present

## 2023-07-29 DIAGNOSIS — Z79621 Long term (current) use of calcineurin inhibitor: Secondary | ICD-10-CM | POA: Diagnosis not present

## 2023-07-29 DIAGNOSIS — I5022 Chronic systolic (congestive) heart failure: Secondary | ICD-10-CM | POA: Insufficient documentation

## 2023-07-29 DIAGNOSIS — Z940 Kidney transplant status: Secondary | ICD-10-CM | POA: Insufficient documentation

## 2023-07-29 DIAGNOSIS — G4733 Obstructive sleep apnea (adult) (pediatric): Secondary | ICD-10-CM | POA: Diagnosis not present

## 2023-07-29 DIAGNOSIS — Z01818 Encounter for other preprocedural examination: Secondary | ICD-10-CM

## 2023-07-29 DIAGNOSIS — I255 Ischemic cardiomyopathy: Secondary | ICD-10-CM | POA: Diagnosis not present

## 2023-07-29 DIAGNOSIS — I482 Chronic atrial fibrillation, unspecified: Secondary | ICD-10-CM | POA: Insufficient documentation

## 2023-07-29 DIAGNOSIS — N184 Chronic kidney disease, stage 4 (severe): Secondary | ICD-10-CM | POA: Diagnosis not present

## 2023-07-29 DIAGNOSIS — Z951 Presence of aortocoronary bypass graft: Secondary | ICD-10-CM | POA: Diagnosis not present

## 2023-07-29 DIAGNOSIS — E1121 Type 2 diabetes mellitus with diabetic nephropathy: Secondary | ICD-10-CM | POA: Diagnosis not present

## 2023-07-29 DIAGNOSIS — D649 Anemia, unspecified: Secondary | ICD-10-CM | POA: Diagnosis not present

## 2023-07-29 DIAGNOSIS — I340 Nonrheumatic mitral (valve) insufficiency: Secondary | ICD-10-CM | POA: Diagnosis not present

## 2023-07-29 MED ORDER — POTASSIUM CHLORIDE CRYS ER 20 MEQ PO TBCR
20.0000 meq | EXTENDED_RELEASE_TABLET | Freq: Every day | ORAL | 3 refills | Status: DC | PRN
  Filled 2023-07-29: qty 90, 90d supply, fill #0

## 2023-07-29 MED ORDER — METOLAZONE 2.5 MG PO TABS
2.5000 mg | ORAL_TABLET | ORAL | 0 refills | Status: DC
  Filled 2023-07-29: qty 3, 3d supply, fill #0

## 2023-07-29 NOTE — Patient Instructions (Addendum)
 Medication Changes:  START Metolazone  2.5mg  tab today only. Please also take 40meq of potassium today with the Metolazone .  Lab Work:  Go over to the [facility]. Go pass the gift shop and have your blood work completed.  We will only call you if the results are abnormal or if the provider would like to make medication changes.   Special Instructions // Education:  Pre procedure: hold Farxiga  3 days prior to your procedure, Hold Eliquis  2 days prior    Follow-Up in: Please follow up with the Heart Failure Clinic in 3 months with Dr. Mitzie Anda. We do not currently have that schedule. Please give us  a call in July in order to schedule your appointment for August.   At the [facility], you and your health needs are our priority. We have a designated team specialized in the treatment of Heart Failure. This Care Team includes your primary Heart Failure Specialized Cardiologist (physician), Advanced Practice Providers (APPs- Physician Assistants and Nurse Practitioners), and Pharmacist who all work together to provide you with the care you need, when you need it.   You may see any of the following providers on your designated Care Team at your next follow up:  Dr. Jules Oar Dr. Peder Bourdon Dr. Alwin Baars Dr. Judyth Nunnery Shawnee Dellen, FNP Bevely Brush, RPH-CPP  Please be sure to bring in all your medications bottles to every appointment.   Need to Contact Us :  If you have any questions or concerns before your next appointment please send us  a message through [place] or call our office at [phone].    TO LEAVE A MESSAGE FOR THE NURSE SELECT OPTION 2, PLEASE LEAVE A MESSAGE INCLUDING: YOUR NAME DATE OF BIRTH CALL BACK NUMBER REASON FOR CALL**this is important as we prioritize the call backs  YOU WILL RECEIVE A CALL BACK THE SAME DAY AS LONG AS YOU CALL BEFORE 4:00 PM

## 2023-07-29 NOTE — Telephone Encounter (Signed)
   Pre-operative Risk Assessment    Patient Name: [name].  DOB: 1961-01-25 MRN: [id number]   Date of last office visit: 05/02/23 Date of next office visit: 08/21/23   Request for Surgical Clearance    Procedure:   Laser removal/Ablation of Condyloma   Date of Surgery:  Clearance TBD                                 Surgeon:  Dr. Candyce Champagne Surgeon's Group or Practice Name:  [facility]  Phone number:  [phone] Fax number:  [phone]   Type of Clearance Requested:   - Medical  - Pharmacy:  Hold Apixaban  (Eliquis ) Not indicated    Type of Anesthesia:  General    Additional requests/questions:    [name]   07/29/2023, 10:43 AM

## 2023-07-29 NOTE — Progress Notes (Signed)
 [facility] Note   Date:  07/29/2023   ID:  [name]., DOB 1960/06/10, MRN [id number]   Provider location: [address] [place] Type of Visit:  Established patient  PCP:  Helaine Llanos, MD  Cardiologist:  Dr. Alvenia Aus Nephrology: Dr. Rochelle Chu  HF Cardiologist: Dr. Mitzie Anda  Chief complaint: CHF  HPI: [name]. is a 63 y.o. male  with a history of CAD s/p CABG 2005, chronic atrial fibrillation on Eliquis , ESRD s/p renal transplant 2012, anc chronic diastolic CHF.  His renal function has been fairly stable, most recent creatinine 1.43.  He has been struggling with volume overload/CHF.  He has a history of ischemic cardiomyopathy with EF as low as 35-40% in the past but echo in 3/20 showed EF 60-65%.  There is evidence for RV failure with pulmonary hypertension by echo.    Admitted 11/05/18 low grade fever and shortness of breath. HS Trop negative. Covid 19 negative. Blood CX negative. Placed on antibiotics and discharged to home the next day.   Admitted 05/03/19 with A/C Diastolic HF. Diuresed with IV lasix  and transitioned to torsemide  80/40 mg . Discharge weight 252 pounds.   Admitted 06/2019 with left arm pain. Left upper extremity venous Doppler revealed occluded cephalic vein at the AV fistula outflow tract in the antecubital fossa with no other thrombus identified. Vascular evaluated. He continued on Eliquis .  No plan for intervention. Also treated for acute gout flare. Discharged on prednisone  taper.     Given IV lasix  on 12/21 by HF Paramedicine.   Admitted to [facility] 2/22 with acute gout flare vs cellulitis. Nephrology was consulted to evaluate transplant regimen and follow along. He was treated with antibiotics and prednisone . Discharged on decreased torsemide  dose of 40 mg daily. Weight 259 lbs.  Admitted 4/22 for left leg cellulitis after sustaining a dog scratch, received IV antibiotics. No changes to his diuretic  therapy. Discharge weight 266 lbs.  Follow up with Dr. Alvenia Aus, 1/23, he had CP and underwent Lexiscan  myoview , which overall was a low risk study with evidence of prior infarct in the infero-apical area with no significant change from before. Losartan  added to GDMT regimen.  Follow up 5/23, volume overloaded, REDs 40%. Advised taking additional metolazone  (3 doses that week), however labs showed worsening renal function and advised to keep at 2 doses.  Admitted in 5/23 with acute on chronic CHF and AKI.  Spiro, Farxiga  and losartan  stopped. Diuresed with IV lasix . Echo showed EF 40-45%, RV mildly reduced, GIIIDD (restrictive), IVC dilated. Nephrology consulted for diuretic dosing. Discharged home, weight 266 lbs.  He had chest pain in 8/23 and was admitted at [facility], minimal HS-TnI elevation with no trend, thought to be demand ischemia from volume overload.    Echo 8/23 showing EF 40-45%, diffuse hypokinesis with dyssynchrony, mildly dilated RV with mildly decreased systolic function, IVC dilated, moderate AS with mean gradient 15, mild-moderate mitral stenosis with mean gradient 6.   Follow up 03/27/22, volume overloaded with 25 lbs weight gain. Instructed to use Furoscix  x 3 days, then resume home dose of torsemide  + metolazone . Clinic weight 277 lbs.  Admitted from [facility] 04/01/22 with a/c CHF. Echo showed EF 40-45%, RV moderately reduced and RVSP 49 mmHg. RHC showed elevated filling pressures, moderate primarily pulmonary venous hypertension and preserved CO. Tadalafil  stopped with primarily PH group 2 and 3. He was diuresed with IV lasix  and metolazone , and GDMT added back. Overall down  19 lbs. He was discharged home, weight 255 lbs.  Patient was admitted in 3/24 with Serratia bacteremia/pyelonephritis, treated with ertapenem .  He also had campylobacter diarrhea.  In 4/24, he tripped getting off his riding lawnmower and fell, fracturing ribs. He had a pulmonary contusion with this.   Patient was  admitted with slurred speech and profuse diarrhea in 11/24.  MRI brain showed no acute stroke.  He had AKI on CKD stage IV with creatinine up to 4.3, presentation thought to be due to diarrhea with dehydration related to use of Mounjaro .  He was given IV fluid and diuretics were stopped.  Losartan  and spironolactone  were stopped. He was sent home without diuretics.   Echo in 12/24 was stable with EF 45-50%, low normal RV function, mild MR.  Zio monitor for bradycardia in 12/24 showed average HR 52, no pauses, 4.7% PVCs.   Patient fell while on a cruise in 1/25 and broke ribs on the right (mechanical fall).  He was later admitted in 1/25 with influenza A and suspected development of secondary bacterial PNA.    Today he returns for AHF follow up for pre-op clearance. Overall feeling great. Denies palpitations, CP, dizziness, or PND/Orthopnea. Intt edema. Denies SOB. Appetite ok. No fever or chills. Taking all medications. Denies ETOH, tobacco or drug use. Has not used PRN metolazone  in a while.   ECG (personally reviewed from 2/25): atrial fibrillation rate 55, IVCD 148 msec   PMH: 1. CAD: s/p CABG x 4 in [place]  in 2005.  - Cardiolite (1/20): EF 47%, small fixed apical septal defect, no ischemia.  Low risk study.  - Cardiolite (12/22): small apical inferior defect, no changes from prior, no ischemia. Low risk study. 2. Asthma 3. Gout 4. Atrial fibrillation: Chronic.  5. ESRD s/p renal transplantation at [facility] in 2012.  6. Anemia of chronic disease 7. Type II diabetes 8. HTN 9. Hyperlipidemia 10. OSA: On bipap.  11. Ischemic cardiomyopathy: EF 35-40% in past.  - Cardiolite (1/20) with EF 47%.  - Echo (1/20): EF 50-55%, mild MR, RV moderately dilated with moderately decreased systolic function, PASP 73 mmHg, moderate TR.   - Echo (3/20): EF 60-65%, PASP 75 mmHg, mildly dilated RV with mildly decreased systolic function.  - Echo (2/21): EF 45-50%, global HK, moderately dilated RV with  moderately decreased systolic function, D-shaped interventricular septum, dilated IVC.  - RHC (2/21): mean RA 19, PA 78/31 mean 51, mean PCWP 26, CI 3.26, PVR 3.25 - Echo (5/23): EF 40-45%, RV mildly reduced, GIIIDD (restrictive) - Echo (8/23): EF 40-45%, diffuse hypokinesis with dyssynchrony, mildly dilated RV with mildly decreased systolic function, IVC dilated, moderate AS with mean gradient 15, mild-moderate mitral stenosis with mean gradient 6.  - Echo (1/24): Echo showed EF 40-45%, RV moderately reduced and RVSP 49 mmHg, mild MR, no AS, dilated IVC.  - RHC (1/24): RA mean 13, PA 62/24 (mean 40), PCWP mean 20, CO/CI (Fick) 7.63/3.26, PVR 2.6 WU, PAPi 2.9 - Echo (12/24): EF 45-50%, low normal RV function, mild MR. 12. Obesity: s/p gastric bypass in 2015.  13. Bradycardia - Zio monitor (12/24): average HR 52, no pauses, 4.7% PVCs 14. Aortic stenosis: Moderate on 8/23 echo.  15. Mitral stenosis: Mild to moderate on 8/23 echo.  16. Rheumatoid arthritis 17. IgG monoclonal gammopathy 18. PAD: ABIs (10/24) with noncompressible ABIs, normal TBIs.  19. H/o Bell's palsy  Past Surgical History:  Procedure Laterality Date   CARDIAC CATHETERIZATION N/A 08/10/2002   CATARACT EXTRACTION W/PHACO  Right 10/29/2017   Procedure: CATARACT EXTRACTION PHACO AND INTRAOCULAR LENS PLACEMENT (IOC)  RIGHT DIABETIC;  Surgeon: Annell Kidney, MD;  [facility];  Service: Ophthalmology;  Laterality: Right   CATARACT EXTRACTION W/PHACO Left 11/18/2017   Procedure: CATARACT EXTRACTION PHACO AND INTRAOCULAR LENS PLACEMENT (IOC) LEFT IVA/TOPICAL;  Surgeon: Annell Kidney, MD;  Location[facility];  Service: Ophthalmology;  Laterality: Left   COLONOSCOPY WITH PROPOFOL  N/A 04/22/2013   Procedure: COLONOSCOPY WITH PROPOFOL ;  Surgeon: Janel Medford, MD;  Location: [facility];  Service: Endoscopy;  Laterality: N/A;   CORONARY ANGIOPLASTY WITH STENT PLACEMENT N/A 1996   CORONARY  ARTERY BYPASS GRAFT N/A 08/13/2002   Procedure: 4v CORONARY ARTERY BYPASS GRAFTING; Location: [facility]; Surgeon: Ruffus Couch, MD   CYSTOSCOPY WITH INSERTION OF UROLIFT N/A 12/25/2021   Procedure: CYSTOSCOPY WITH INSERTION OF UROLIFT;  Surgeon: Geraline Knapp, MD;  Location: [facility];  Service: Urology;  Laterality: N/A;   DG ANGIO AV SHUNT*L*     right and left upper arms   FASCIOTOMY  03/03/2012   Procedure: FASCIOTOMY;  Surgeon: Kemp Patter, MD;  Location: [facility];  Service: Orthopedics;  Laterality: Right;  FASCIOTOMY RIGHT SMALL FINGER   FASCIOTOMY Left 08/17/2013   Procedure: FASCIOTOMY LEFT RING;  Surgeon: Kemp Patter, MD;  Location: [facility];  Service: Orthopedics;  Laterality: Left;   INCISION AND DRAINAGE ABSCESS Left 10/15/2015   Procedure: INCISION AND DRAINAGE ABSCESS;  Surgeon: Alben Alma, MD;  Location: [facility];  Service: General;  Laterality: Left;   KIDNEY TRANSPLANT  09/13/2010   cadaver--at [facility]   LASER ABLATION CONDOLAMATA N/A 01/08/2023   Procedure: LASER REMOVAL ABLATION OF CONDYLOMATA;  Surgeon: Candyce Champagne, MD;  Location: [facility];  Service: General;  Laterality: N/A;  GEN AND LOCAL   RECTAL EXAM UNDER ANESTHESIA N/A 01/08/2023   Procedure: ANORECTAL EXAM UNDER ANESTHESIA;  Surgeon: Candyce Champagne, MD;  Location: [facility];  Service: General;  Laterality: N/A;   RIGHT HEART CATH N/A 11/15/2016   Procedure: RIGHT HEART CATH;  Surgeon: Wenona Hamilton, MD;  Location: [facility];  Service: Cardiovascular;  Laterality: N/A;   RIGHT HEART CATH N/A 05/03/2019   Procedure: RIGHT HEART CATH;  Surgeon: Darlis Eisenmenger, MD;  Location: [facility] IN[facility] CV LAB;  Service: Cardiovascular;  Laterality: N/A;   RIGHT HEART CATH N/A 04/04/2022   Procedure: RIGHT HEART CATH;  Surgeon: Darlis Eisenmenger, MD;  Location: [facility] INVASIVE CV LAB;  Service: Cardiovascular;  Laterality: N/A;   ROUX-EN-Y GASTRIC BYPASS N/A 2015   TYMPANIC MEMBRANE  REPAIR Left 03/2010   VASECTOMY     WART FULGURATION N/A 05/31/2022   Procedure: FULGURATION ANAL WART;  Surgeon: Conrado Delay, DO;  Location: [facility];  Service: General;  Laterality: N/A;   Current Outpatient Medications  Medication Sig Dispense Refill   albuterol  (PROVENTIL ) (2.5 MG/3ML) 0.083% nebulizer solution Take 3 mLs (2.5 mg total) by nebulization every 6 (six) hours as needed for wheezing or shortness of breath. 150 mL 0   albuterol  (VENTOLIN  HFA) 108 (90 Base) MCG/ACT inhaler Inhale 2 puffs into the lungs every 6 (six) hours as needed for wheezing or shortness of breath. 6.7 g 5   apixaban  (ELIQUIS ) 5 MG TABS tablet Take 1 tablet (5 mg total) by mouth 2 (two) times daily. 180 tablet 3   benzonatate  (TESSALON ) 200 MG capsule Take 1 capsule (200 mg total) by mouth 3 (three) times daily as  needed for cough. 60 capsule 0   buPROPion  (WELLBUTRIN  SR) 150 MG 12 hr tablet Take 1 tablet (150 mg total) by mouth 2 (two) times daily. 180 tablet 3   colchicine  0.6 MG tablet Take 1 tablet (0.6 mg total) by mouth every other day. 45 tablet 1   Continuous Glucose Sensor (FREESTYLE LIBRE 3 SENSOR) MISC Use one every 2 weeks. 6 each 3   Cyanocobalamin  (B-12 PO) Take 1 tablet by mouth daily.     dapagliflozin  propanediol (FARXIGA ) 10 MG TABS tablet Take 1 tablet (10 mg total) by mouth daily before breakfast. 90 tablet 3   febuxostat  (ULORIC ) 40 MG tablet Take 3 tablets (120 mg total) by mouth daily. 90 tablet 5   fluticasone  (FLONASE ) 50 MCG/ACT nasal spray Place 2 sprays into both nostrils daily. 16 g 11   gabapentin  (NEURONTIN ) 300 MG capsule Take 1 capsule (300 mg total) by mouth 2 (two) times daily. 180 capsule 3   insulin  glargine-yfgn (SEMGLEE ) 100 UNIT/ML Pen Inject 20 Units into the skin daily. 45 mL 3   Insulin  Pen Needle (TECHLITE PEN NEEDLES) 31G X 5 MM MISC 4 (four) times daily. 400 each 3   Insulin  Pen Needle (TECHLITE PEN NEEDLES) 32G X 6 MM MISC Use 4 times a day 100 each 3   Insulin  Pen  Needle (UNIFINE PENTIPS) 31G X 5 MM MISC Use 4 times daily as directed 400 each 3   losartan  (COZAAR ) 25 MG tablet Take 1 tablet (25 mg total) by mouth daily. 90 tablet 3   montelukast  (SINGULAIR ) 10 MG tablet Take 1 tablet (10 mg total) by mouth at bedtime. 30 tablet 11   mycophenolate  (MYFORTIC ) 180 MG EC tablet Take 2 tablets (360 mg total) by mouth every morning AND 1 tablet (180 mg total) every evening. 360 tablet 3   omeprazole  (PRILOSEC) 20 MG capsule Take 1 capsule (20 mg total) by mouth daily. 30 capsule 11   predniSONE  (DELTASONE ) 5 MG tablet Take 1 tablet (5 mg total) by mouth daily. 90 tablet 3   rosuvastatin  (CRESTOR ) 10 MG tablet Take 1 tablet (10 mg total) by mouth daily. 90 tablet 3   sulfamethoxazole -trimethoprim  (BACTRIM ) 400-80 MG tablet Take 1 tablet by mouth 3 (three) times a week. 36 tablet 3   tacrolimus  (PROGRAF ) 1 MG capsule Take 2 capsules (2 mg total) by mouth every morning AND 1 capsule (1 mg total) every evening. 90 capsule 11   tamsulosin  (FLOMAX ) 0.4 MG CAPS capsule Take 1 capsule (0.4 mg total) by mouth daily. 30 capsule 11   Testosterone  20.25 MG/ACT (1.62%) GEL Place 2 Pump onto the skin daily. 150 g 3   tirzepatide  (MOUNJARO ) 5 MG/0.5ML Pen Inject 5 mg into the skin once a week. 2 mL 5   tiZANidine  (ZANAFLEX ) 2 MG tablet Take 1 tablet (2 mg total) by mouth every 6 (six) hours as needed for muscle spasms. 30 tablet 1   torsemide  (DEMADEX ) 20 MG tablet Take 4 tablets (80 mg total) by mouth 2 (two) times daily. 240 tablet 3   traMADol  (ULTRAM ) 50 MG tablet Take 1 tablet (50 mg total) by mouth every 6 (six) hours as needed for pain. 20 tablet 0   Zinc  50 MG TABS Take 1 tablet (50 mg total) by mouth daily. 100 tablet 3   amoxicillin  (AMOXIL ) 500 MG capsule Take 4 capsules (2,000 mg total) by mouth 1 hour before dental appt (Patient not taking: Reported on 07/29/2023) 4 capsule 5   fluorouracil  (EFUDEX )  5 % cream Apply topically 2 (two) times daily for 60 days Apply to  anus starting after laser surgery for 8 weeks to prevent recurrent lesions (Patient not taking: Reported on 07/29/2023) 40 g 2   ofloxacin  (OCUFLOX ) 0.3 % ophthalmic solution Instill 1 drop into both eyes three times a day (Patient not taking: Reported on 07/25/2023) 5 mL 0   No current facility-administered medications for this visit.   Allergies:   Contrast media [iodinated contrast media], Iodine, Nsaids, and Ibuprofen   Social History:  The patient  reports that he quit smoking about 28 years ago. His smoking use included cigars. He has never been exposed to tobacco smoke. He has never used smokeless tobacco. He reports that he does not currently use alcohol. He reports that he does not use drugs.   Family History:  The patient's family history includes Breast cancer in his maternal grandmother; Diabetes in his maternal grandmother; Heart disease in his father; Kidney disease in his father; Kidney failure in his father; Liver cancer in his paternal grandmother and paternal uncle; Valvular heart disease in his mother.   ROS:  Please see the history of present illness.   All other systems are personally reviewed and negative.   Recent Labs: 10/21/2022: NT-Pro BNP 4,402 02/03/2023: ALT 15 02/05/2023: Magnesium  2.7 02/11/2023: TSH 2.31 05/02/2023: BNP 443.8; BUN 62; Creatinine, Ser 2.45; Potassium 3.7; Sodium 144 05/22/2023: Hemoglobin 9.6; Platelet Count 123   Wt Readings from Last 3 Encounters:  07/29/23 248 lb (112.5 kg)  07/28/23 240 lb (108.9 kg)  07/17/23 239 lb (108.4 kg)    Vitals:   07/29/23 1417  BP: 134/61  Pulse: 61  SpO2: 94%   General:  well appearing.  No respiratory difficulty. Walked into clinic.  Neck: supple. JVD ~8 cm.  Cor: PMI nondisplaced. Regular rate & rhythm. No rubs, gallops or murmurs. Lungs: clear Extremities: no cyanosis, clubbing, rash, trace BLE edema  Neuro: alert & oriented x 3. Moves all 4 extremities w/o difficulty. Affect pleasant.   Assessment &  Plan: 1. Chronic heart failure with Mid Range EF/ With prominent RV failure/pulmonary hypertension:  Ischemic cardiomyopathy.  ?If RV failure is related to severe OSA, he was a remote smoker. Echo in 2/21 with EF 45-50%, moderately dilated RV with moderately decreased systolic function, moderate pulmonary hypertension. Echo in 12/24 showed EF 45-50%, low normal RV function, mild MR. Management complicated by CKD stage 4.  NYHA class II.  Volume midlly elevated and weight up ~4lbs. Will have him take his PRN metolazone  today 2.5 mg x1 with additional 40 mEq KDUR. Will check labs today.  - Continue torsemide  80 mg bid.   - Continue losartan  25 mg daily.  - Will not start spironolactone  with CKD stage IV.  - Continue Farxiga  10 mg daily. Will need to watch closely for further UTIs.  Hold 3 days pre-procedure.  - Tadalafil  was stopped due to group 2 and 3 PH (not group 1).  2. Atrial fibrillation: Chronic. Rate controlled.  - Continue Eliquis .  - Not on beta blocker with bradycardia.   3. CAD: S/p CABG 2005.  Cardiolite 12/22 with prior MI, no ischemia.  No chest pain. High threshold for cath given CKD.  - No ASA given Eliquis  use.  - Continue Crestor  10 mg daily. Good lipids 1/25.  4. CKD Stage 4: S/p renal transplantation in 2012. Follow[facility]. He remains on mycophenolate  and tacrolimus .   - BMET today.  5.  OSA: Continue Bipap every night.  6. HTN: BP generally controlled.  - Continue losartan  - Continue amlodipine  7. DM 2: On insulin . He follows with Endocrinology. - Continue dapagliflozin  for now but watch for further GU symptoms. - He is on Mounjaro . Holding instructions as below 8. Pulmonary hypertension: Primarily pulmonary venous hypertension by RHC in 1/24 though there may be group 3 PH component from OSA.  - Now off tadalafil .  9. Chronic Anemia: Gets iron  infusions.  10. IgG monoclonal protein: Needs followup with his hematologist.  11. Bradycardia:  Suspect tachy-brady syndrome. HR generally in 50s.  Zio monitor in 12/24 did not show worrisome bradyarrhythmias (avg HR in 50s, no long pauses).   - No nodal blockers.  - May need pacemaker in future.  12. Pre-op clearance:   - Revised Cardiac Risk Index for Pre-Operative Risk Score: 4. 15% risk of major cardiac event - Discussed with Dr. Mitzie Anda. Ok to hold Eliquis  for 2 days prior to procedure. Will need to hold Farxiga  3 days prior. Will need to hold Mounjaro  1 week before procedure.  - Will route note to Dr. Hershell Lose so that they can proceed with rescheduling.   Follow-up 3 months with Dr. Colleen Dawn AGACNP-BC  07/29/2023

## 2023-07-29 NOTE — Addendum Note (Signed)
 Addended by: Autry Legions A on: 07/29/2023 03:10 PM   Modules accepted: Orders

## 2023-07-29 NOTE — Addendum Note (Signed)
 Addended by: Autry Legions A on: 07/29/2023 03:14 PM   Modules accepted: Orders

## 2023-07-30 ENCOUNTER — Other Ambulatory Visit: Payer: Self-pay

## 2023-07-30 LAB — BASIC METABOLIC PANEL WITH GFR
BUN/Creatinine Ratio: 27 — ABNORMAL HIGH (ref 10–24)
BUN: 85 mg/dL (ref 8–27)
CO2: 19 mmol/L — ABNORMAL LOW (ref 20–29)
Calcium: 9.4 mg/dL (ref 8.6–10.2)
Chloride: 107 mmol/L — ABNORMAL HIGH (ref 96–106)
Creatinine, Ser: 3.14 mg/dL — ABNORMAL HIGH (ref 0.76–1.27)
Glucose: 116 mg/dL — ABNORMAL HIGH (ref 70–99)
Potassium: 5.3 mmol/L — ABNORMAL HIGH (ref 3.5–5.2)
Sodium: 144 mmol/L (ref 134–144)
eGFR: 21 mL/min/{1.73_m2} — ABNORMAL LOW (ref >59)

## 2023-07-30 LAB — BRAIN NATRIURETIC PEPTIDE: BNP: 241.4 pg/mL — ABNORMAL HIGH (ref 0.0–100.0)

## 2023-07-30 MED ORDER — FEBUXOSTAT 40 MG PO TABS
120.0000 mg | ORAL_TABLET | Freq: Every day | ORAL | 5 refills | Status: DC
  Filled 2023-07-30: qty 90, 30d supply, fill #0
  Filled 2023-08-18 – 2023-09-09 (×2): qty 90, 30d supply, fill #1
  Filled 2023-10-06: qty 90, 30d supply, fill #2
  Filled 2023-11-16: qty 90, 30d supply, fill #3
  Filled 2023-12-31: qty 90, 30d supply, fill #4
  Filled 2024-01-28: qty 90, 30d supply, fill #5

## 2023-07-30 NOTE — Telephone Encounter (Signed)
 Request addressed at office visit on 07/29/23 with Dr. Bruce Caper. Noted faxed to requesting office. Will remove request from preop pool.

## 2023-07-31 ENCOUNTER — Encounter (HOSPITAL_COMMUNITY): Admission: RE | Payer: Self-pay | Source: Home / Self Care

## 2023-07-31 ENCOUNTER — Ambulatory Visit (HOSPITAL_COMMUNITY): Admission: RE | Admit: 2023-07-31 | Source: Home / Self Care | Admitting: Surgery

## 2023-07-31 ENCOUNTER — Telehealth: Payer: Self-pay | Admitting: *Deleted

## 2023-07-31 DIAGNOSIS — I5022 Chronic systolic (congestive) heart failure: Secondary | ICD-10-CM

## 2023-07-31 SURGERY — ABLATION, CONDYLOMA, USING LASER
Anesthesia: General

## 2023-07-31 NOTE — Telephone Encounter (Signed)
 Pt aware, agreeable, and verbalized understanding, he will come in Tue 5/13 for labs, order placed

## 2023-07-31 NOTE — Telephone Encounter (Signed)
-----   Message from Sheryl Donna sent at 07/31/2023 10:56 AM EDT ----- K high. Hold Losartan  for 2 days then restart. BMET early next week. Avoid K supplements/vitamins. No Ensure/Boost drinks.

## 2023-08-01 ENCOUNTER — Other Ambulatory Visit: Payer: Self-pay

## 2023-08-04 NOTE — Progress Notes (Signed)
 Spoke to patient to update him on surgery rescheduled for 08-06-23.  Patient stated that he needs to r/s this surgery because he has facial swelling and thinks he is going to need a dental procedure first.  Spoke to [place] at Dr. Hershell Lose office and made her aware and they will reach out to patient.

## 2023-08-05 ENCOUNTER — Telehealth: Payer: Self-pay | Admitting: *Deleted

## 2023-08-05 ENCOUNTER — Ambulatory Visit (HOSPITAL_COMMUNITY): Payer: Self-pay | Admitting: Cardiology

## 2023-08-05 ENCOUNTER — Ambulatory Visit: Payer: Self-pay | Admitting: Oncology

## 2023-08-05 ENCOUNTER — Inpatient Hospital Stay: Attending: Oncology

## 2023-08-05 ENCOUNTER — Other Ambulatory Visit
Admission: RE | Admit: 2023-08-05 | Discharge: 2023-08-05 | Disposition: A | Discharge status: Home / Self Care | Attending: Cardiology | Admitting: Cardiology

## 2023-08-05 DIAGNOSIS — I5022 Chronic systolic (congestive) heart failure: Secondary | ICD-10-CM | POA: Diagnosis not present

## 2023-08-05 DIAGNOSIS — Z79899 Other long term (current) drug therapy: Secondary | ICD-10-CM | POA: Insufficient documentation

## 2023-08-05 DIAGNOSIS — D649 Anemia, unspecified: Secondary | ICD-10-CM

## 2023-08-05 DIAGNOSIS — Z940 Kidney transplant status: Secondary | ICD-10-CM | POA: Diagnosis not present

## 2023-08-05 DIAGNOSIS — D631 Anemia in chronic kidney disease: Secondary | ICD-10-CM | POA: Diagnosis not present

## 2023-08-05 DIAGNOSIS — N184 Chronic kidney disease, stage 4 (severe): Secondary | ICD-10-CM | POA: Insufficient documentation

## 2023-08-05 LAB — FOLATE: Folate: 12 ng/mL (ref >5.9)

## 2023-08-05 LAB — CBC
HCT: 37.4 % — ABNORMAL LOW (ref 39.0–52.0)
Hemoglobin: 11.5 g/dL — ABNORMAL LOW (ref 13.0–17.0)
MCH: 27 pg (ref 26.0–34.0)
MCHC: 30.7 g/dL (ref 30.0–36.0)
MCV: 87.8 fL (ref 80.0–100.0)
Platelets: 104 10*3/uL — ABNORMAL LOW (ref 150–400)
RBC: 4.26 MIL/uL (ref 4.22–5.81)
RDW: 18.4 % — ABNORMAL HIGH (ref 11.5–15.5)
WBC: 4.5 10*3/uL (ref 4.0–10.5)
nRBC: 0 % (ref 0.0–0.2)

## 2023-08-05 LAB — BASIC METABOLIC PANEL WITH GFR
Anion gap: 10 (ref 5–15)
BUN: 85 mg/dL — ABNORMAL HIGH (ref 8–23)
CO2: 23 mmol/L (ref 22–32)
Calcium: 9.3 mg/dL (ref 8.9–10.3)
Chloride: 109 mmol/L (ref 98–111)
Creatinine, Ser: 2.59 mg/dL — ABNORMAL HIGH (ref 0.61–1.24)
GFR, Estimated: 27 mL/min — ABNORMAL LOW (ref >60)
Glucose, Bld: 120 mg/dL — ABNORMAL HIGH (ref 70–99)
Potassium: 3.8 mmol/L (ref 3.5–5.1)
Sodium: 142 mmol/L (ref 135–145)

## 2023-08-05 LAB — FERRITIN: Ferritin: 128 ng/mL (ref 24–336)

## 2023-08-05 LAB — VITAMIN B12: Vitamin B-12: 501 pg/mL (ref 180–914)

## 2023-08-05 LAB — IRON AND TIBC
Iron: 42 ug/dL — ABNORMAL LOW (ref 45–182)
Saturation Ratios: 15 % — ABNORMAL LOW (ref 17.9–39.5)
TIBC: 280 ug/dL (ref 250–450)
UIBC: 238 ug/dL

## 2023-08-05 LAB — TSH: TSH: 2.267 u[IU]/mL (ref 0.350–4.500)

## 2023-08-05 NOTE — Telephone Encounter (Signed)
 Patient says he has no energy he is freezing all the time, and he is easily to go to sleep all the time. He is requesting if he can come and have labs today to see if anything was wrong in anything that could make him better.  Dr. Randy Buttery says that it is fine for him to come in and get labs they are already been put in for today.  I have called the patient but he did not answer so I sent a voicemail for him to give me a call so that we can put him in somewhere today as long as he can get in touch with us  so we can help him

## 2023-08-05 NOTE — Telephone Encounter (Signed)
-----   Message from Avonne Boettcher sent at 08/05/2023  3:00 PM EDT ----- Hb has actually improved after iv iron  in march. Ferritin is >100. If he is still symptomatic we can give him venofer  X3 since iron  sat is <20%. Please ask him if he wants that

## 2023-08-05 NOTE — Telephone Encounter (Signed)
 Per Dr. Randy Buttery "Hb has actually improved after iv iron  in march. Ferritin is >100. If he is still symptomatic we can give him venofer  X3 since iron  sat is <20%. Please ask him if he wants that".  Outbound call to patient; informed of above.  Patient would like to move forward with Venofer  x 3; mentioned that he is going out of town this weekend and didn't know if this Friday 5/16 was available.  Informed scheduling will be in touch shortly to coordinate; patient verbalized understanding.

## 2023-08-05 NOTE — Telephone Encounter (Signed)
 I though there was a 10:15 and I called and let the pt. Know to come and when I asked to put it on for the above - I did not see the right spot and then I tried to call him back so he can come at 10:45 instead and and when I did it went to voicemail so I am not sure if he is going to get it or not before he gets here.  I did call downstairs to the labs and talk to [place] and she says it is okay if he comes at 10:15

## 2023-08-06 ENCOUNTER — Encounter (HOSPITAL_COMMUNITY): Admission: RE | Payer: Self-pay | Source: Home / Self Care

## 2023-08-06 ENCOUNTER — Other Ambulatory Visit: Payer: Self-pay

## 2023-08-06 ENCOUNTER — Ambulatory Visit (HOSPITAL_COMMUNITY): Admission: RE | Admit: 2023-08-06 | Source: Home / Self Care | Admitting: Surgery

## 2023-08-06 DIAGNOSIS — E349 Endocrine disorder, unspecified: Secondary | ICD-10-CM

## 2023-08-06 DIAGNOSIS — N400 Enlarged prostate without lower urinary tract symptoms: Secondary | ICD-10-CM

## 2023-08-06 SURGERY — ABLATION, CONDYLOMA, USING LASER
Anesthesia: General

## 2023-08-07 ENCOUNTER — Inpatient Hospital Stay: Admitting: Oncology

## 2023-08-07 ENCOUNTER — Inpatient Hospital Stay

## 2023-08-07 ENCOUNTER — Other Ambulatory Visit: Payer: Self-pay

## 2023-08-07 VITALS — BP 135/70 | HR 52 | Temp 96.6°F | Resp 18

## 2023-08-07 DIAGNOSIS — D631 Anemia in chronic kidney disease: Secondary | ICD-10-CM

## 2023-08-07 DIAGNOSIS — E349 Endocrine disorder, unspecified: Secondary | ICD-10-CM | POA: Diagnosis not present

## 2023-08-07 DIAGNOSIS — D509 Iron deficiency anemia, unspecified: Secondary | ICD-10-CM | POA: Diagnosis not present

## 2023-08-07 DIAGNOSIS — N400 Enlarged prostate without lower urinary tract symptoms: Secondary | ICD-10-CM | POA: Diagnosis not present

## 2023-08-07 DIAGNOSIS — D508 Other iron deficiency anemias: Secondary | ICD-10-CM

## 2023-08-07 DIAGNOSIS — N184 Chronic kidney disease, stage 4 (severe): Secondary | ICD-10-CM | POA: Diagnosis not present

## 2023-08-07 MED ORDER — IRON SUCROSE 20 MG/ML IV SOLN
200.0000 mg | INTRAVENOUS | Status: DC
  Administered 2023-08-07: 200 mg via INTRAVENOUS

## 2023-08-08 ENCOUNTER — Ambulatory Visit: Payer: Self-pay | Admitting: Urology

## 2023-08-08 LAB — MISC LABCORP TEST (SEND OUT): Labcorp test code: 165620

## 2023-08-08 LAB — HEMATOCRIT: Hematocrit: 37.7 % (ref 37.5–51.0)

## 2023-08-08 LAB — PSA: Prostate Specific Ag, Serum: 1.1 ng/mL (ref 0.0–4.0)

## 2023-08-08 LAB — TESTOSTERONE: Testosterone: 345 ng/dL (ref 264–916)

## 2023-08-09 ENCOUNTER — Encounter: Payer: Self-pay | Admitting: Oncology

## 2023-08-09 NOTE — Progress Notes (Signed)
 I connected with [name] on 08/07/23 at 8.30 am by video enabled telemedicine visit and verified that I am speaking with the correct person using two identifiers.   I discussed the limitations, risks, security and privacy concerns of performing an evaluation and management service by telemedicine and the availability of in-person appointments. I also discussed with the patient that there may be a patient responsible charge related to this service. The patient expressed understanding and agreed to proceed.  Other persons participating in the visit and their role in the encounter:  none  Patient's location:  home Provider's location:  home  Chief Complaint: Routine follow-up of anemia of chronic kidney disease  History of present illness: patient is a 63 year old male with a past medical history is significant for stage IV CKD, ischemic cardiomyopathy, atrial fibrillation, hypertension hyperlipidemia, CAD among other medical problems.  He has undergone kidney transplant back in June 2012.  He is presently on maintenance tacrolimus  and mycophenolate .  He has been referred to us  for anemia.  CBC from 07/08/2022 showed white cell count of 5, H&H of 10.6/37 with an MCV of 82 and a platelet count of 150.  Looking back at his CBC his hemoglobin has been around 10 for the last 3 months.  Up until June 2023 his hemoglobin was close to 12.   Patient has been following up with Dr. Alayne Allis from [facility] nephrology.  He has been receiving EPO as well as iron  injections through them.  He last received IV iron  about 3 weeks ago.  He has been receiving EPO injections about once a month and would like to transition over here for EPO and iron   Interval history : reports feeling fatigued more than usual.  No recent hospitalizations.   Review of Systems  Constitutional:  Positive for malaise/fatigue. Negative for chills, fever and weight loss.  HENT:  Negative for congestion, ear discharge and nosebleeds.    Eyes:  Negative for blurred vision.  Respiratory:  Negative for cough, hemoptysis, sputum production, shortness of breath and wheezing.   Cardiovascular:  Negative for chest pain, palpitations, orthopnea and claudication.  Gastrointestinal:  Negative for abdominal pain, blood in stool, constipation, diarrhea, heartburn, melena, nausea and vomiting.  Genitourinary:  Negative for dysuria, flank pain, frequency, hematuria and urgency.  Musculoskeletal:  Negative for back pain, joint pain and myalgias.  Skin:  Negative for rash.  Neurological:  Negative for dizziness, tingling, focal weakness, seizures, weakness and headaches.  Endo/Heme/Allergies:  Does not bruise/bleed easily.  Psychiatric/Behavioral:  Negative for depression and suicidal ideas. The patient does not have insomnia.     Allergies  Allergen Reactions   Contrast Media [Iodinated Contrast Media] Other (See Comments)    Kidney transplant   Iodine Other (See Comments)    Kidney transplant   Nsaids Other (See Comments)    Kidney transplant   Ibuprofen Other (See Comments)    Due to kidney transplant    Past Medical History:  Diagnosis Date   Anal condyloma    Anemia    Aortic atherosclerosis (HCC)    Aortic stenosis    a.) TTE 10/2021: Mod AS. AVA 1.06cm^2 (VTI). Mean grad ; b.) TTE 04/02/2022: no AV stenosis   Asthma, persistent controlled 02/25/2013   Attention or concentration deficit 04/26/2021   BPH with obstruction/lower urinary tract symptoms 09/25/2014   CAD (coronary artery disease) 2005   a.) LHC 1996 -> HG stenosis mLAD -> PTCA/PCI with stent x 1 (unk type) -> complicated by  CFA pseudoanurysm (required surg);  b.) LHC 08/10/2002  -> 95% LAD, 70% ISR LAD, 80% pOM, 70% mRCA --> CVTS consult; c.) 4v CABG 08/13/2002; c.) 03/2018 MV: Small, fixed inferoapical and apical sep defect. No isc. EF 47% (60-65% by 05/2018 TTE); d.) 02/2021 MV: small Apical inf fixed defect. No isc -> low risk.   Cardiomegaly     Cellulitis and abscess of left leg 07/22/2020   Chronic atrial fibrillation    a.) CHA2DS2VASc = 4 (CHF, HTN, vascular disease history, T2DM);  b.) rate/rhythm maintained without pharmacological intervention; chronically anticoagulated with apixaban    Chronic combined systolic and diastolic CHF (congestive heart failure)    a.) TTE 7/18 : EF 45-50%; b.) TTE 05/2018 : EF 60-65%, RVSP 74.8; c.) TTE 12/2018: EF 50-55%. Sev dil LA; d.) TTE 04/2019: EF 45-50%; e.) TTE 07/2021: EF 40-45%, G3DD; f.) TTE 10/2021: EF 40-45%, mild conc LVH, septal-lat dyssynchrony (LBBB), mildly red RVSF, mild-mod dil LA, mildly dil RA, mild-mod MS, mod AS; g.) TTE 04/02/2022: EF 40-45%, glob HK, LVH, red RVSF, RVE, sev LAE, mild-mod RAE, mild MR   Chronic venous stasis dermatitis of both lower extremities    Cytomegaloviral disease 2017   Diverticulosis of colon 04/22/2013   Dyspnea    Elevated RIGHT hemidiaphragm    Erectile dysfunction    a.) on topical TRT + Trimix (alprostadil/papaverine/phentolamine) injections   ESRD (end stage renal disease) (HCC)    a.) dialysis dependent 2004-2012; b.) s/p cadaveric RIGHT renal transplant 09/13/2010   Essential hypertension 12/17/2010   GERD (gastroesophageal reflux disease)    Gout    History of 2019 novel coronavirus disease (COVID-19) 06/30/2019   History of bilateral cataract extraction 10/2017   History of renal transplant 09/13/2010   a.) s/p cadaveric donor transplant 09/13/2010   Hx of bilateral cataract extraction 10/2017   Hyperlipidemia    Hypogonadism in male 09/25/2014   Ischemic cardiomyopathy    Left cephalic vein thrombosis 06/2019   Long term current use of anticoagulant    a.) apixaban    Long term current use of immunosuppressive drug    a.) mycophenolate  + prednisone  + tacrolimus    Major depressive disorder 10/04/2013   Mitral stenosis 11/07/2021   a.) TTE 11/07/2021: severe MAC, mild-mod MS (MPG 6 mmHg); b.) TTE 04/02/2022: no MV stenosis   Murmur     Obesity hypoventilation syndrome 03/31/2012   OSA treated with BiPAP 09/29/2012   PAH (pulmonary artery hypertension)    a. 04/2019 RHC: RA 19, RV 80/20, PA 78/31 (51), PCWP 25, CO/CI 7.69/3.26. PVR 3.25 --> Sev PAH, likely primarily PV HTN; b.) RHC 04/04/2022: mRA 13, mPA 40, mPCWP 20, PA sat 59, AO sat 92, CO 7.63, CI 3.26, PVR 2.6 --> mod portal venous HTN   Pancreatitis    S/P CABG x 4 08/13/2002   a.) LIMA-LAD, LRA-OM, SVG-D1, SVG-dRCA   S/P gastric bypass    Synovitis of finger 06/11/2019   Trigger finger, unspecified little finger 12/23/2018   Type 2 diabetes mellitus with hyperglycemia, with long-term current use of insulin  09/09/2014   a.) has FreeStyle Libre CGM   Ulcer    Left shin goes to wound care center at [facility] qweek   Wears glasses    Wears hearing aid in both ears     Past Surgical History:  Procedure Laterality Date   CARDIAC CATHETERIZATION N/A 08/10/2002   CATARACT EXTRACTION W/PHACO Right 10/29/2017   Procedure: CATARACT EXTRACTION PHACO AND INTRAOCULAR LENS PLACEMENT (IOC)  RIGHT DIABETIC;  Surgeon: Annell Kidney, MD;  Location: [facility];  Service: Ophthalmology;  Laterality: Right   CATARACT EXTRACTION W/PHACO Left 11/18/2017   Procedure: CATARACT EXTRACTION PHACO AND INTRAOCULAR LENS PLACEMENT (IOC) LEFT IVA/TOPICAL;  Surgeon: Annell Kidney, MD;  Location: [facility];  Service: Ophthalmology;  Laterality: Left   COLONOSCOPY WITH PROPOFOL  N/A 04/22/2013   Procedure: COLONOSCOPY WITH PROPOFOL ;  Surgeon: Janel Medford, MD;  Location: [facility];  Service: Endoscopy;  Laterality: N/A;   CORONARY ANGIOPLASTY WITH STENT PLACEMENT N/A 1996   CORONARY ARTERY BYPASS GRAFT N/A 08/13/2002   Procedure: 4v CORONARY ARTERY BYPASS GRAFTING; Location: [facility]; Surgeon: Ruffus Couch, MD   CYSTOSCOPY WITH INSERTION OF UROLIFT N/A 12/25/2021   Procedure: CYSTOSCOPY WITH INSERTION OF UROLIFT;  Surgeon: Geraline Knapp, MD;  Location:  [facility];  Service: Urology;  Laterality: N/A;   DG ANGIO AV SHUNT*L*     right and left upper arms   FASCIOTOMY  03/03/2012   Procedure: FASCIOTOMY;  Surgeon: Kemp Patter, MD;  Location: [facility];  Service: Orthopedics;  Laterality: Right;  FASCIOTOMY RIGHT SMALL FINGER   FASCIOTOMY Left 08/17/2013   Procedure: FASCIOTOMY LEFT RING;  Surgeon: Kemp Patter, MD;  Location: [facility];  Service: Orthopedics;  Laterality: Left;   INCISION AND DRAINAGE ABSCESS Left 10/15/2015   Procedure: INCISION AND DRAINAGE ABSCESS;  Surgeon: Alben Alma, MD;  Location: [facility];  Service: General;  Laterality: Left;   KIDNEY TRANSPLANT  09/13/2010   cadaver--at [facility]   LASER ABLATION CONDOLAMATA N/A 01/08/2023   Procedure: LASER REMOVAL ABLATION OF CONDYLOMATA;  Surgeon: Candyce Champagne, MD;  Location: [facility];  Service: General;  Laterality: N/A;  GEN AND LOCAL   RECTAL EXAM UNDER ANESTHESIA N/A 01/08/2023   Procedure: ANORECTAL EXAM UNDER ANESTHESIA;  Surgeon: Candyce Champagne, MD;  Location: [facility];  Service: General;  Laterality: N/A;   RIGHT HEART CATH N/A 11/15/2016   Procedure: RIGHT HEART CATH;  Surgeon: Wenona Hamilton, MD;  Location: [facility];  Service: Cardiovascular;  Laterality: N/A;   RIGHT HEART CATH N/A 05/03/2019   Procedure: RIGHT HEART CATH;  Surgeon: Darlis Eisenmenger, MD;  Location: [facility];  Service: Cardiovascular;  Laterality: N/A;   RIGHT HEART CATH N/A 04/04/2022   Procedure: RIGHT HEART CATH;  Surgeon: Darlis Eisenmenger, MD;  Location: [facility];  Service: Cardiovascular;  Laterality: N/A;   ROUX-EN-Y GASTRIC BYPASS N/A 2015   TYMPANIC MEMBRANE REPAIR Left 03/2010   VASECTOMY     WART FULGURATION N/A 05/31/2022   Procedure: FULGURATION ANAL WART;  Surgeon: Conrado Delay, DO;  Location: [facility];  Service: General;  Laterality: N/A;    Social History   Socioeconomic History   Marital status: Married    Spouse name:  Not on file   Number of children: 2   Years of education: 14   Highest education level: Associate degree: academic program  Occupational History   Occupation: Presenter, broadcasting: unemployed    Comment: disabled due to kidney failure  Tobacco Use   Smoking status: Former    Types: Cigars    Quit date: 09/02/1994    Years since quitting: 28.9    Passive exposure: Never   Smokeless tobacco: Never  Vaping Use   Vaping status: Never Used  Substance and Sexual Activity   Alcohol use: Not Currently    Comment: occ   Drug use: No  Sexual activity: Not on file  Other Topics Concern   Not on file  Social History Narrative   Has living will   Wife is health care POA---son Zoila Hines is alternate   Would accept resuscitation attempts   No tube feedings if cognitively unaware   Social Drivers of Health   Financial Resource Strain: Not on file  Food Insecurity: Low Risk  (06/25/2023)   Received from [facility]   Hunger Vital Sign    Worried About Running Out of Food in the Last Year: Never true    Ran Out of Food in the Last Year: Never true  Transportation Needs: No Transportation Needs (06/25/2023)   Received from [facility]    In the past 12 months, has lack of reliable transportation kept you from medical appointments, meetings, work or from getting things needed for daily living? : No  Physical Activity: Not on file  Stress: Not on file  Social Connections: Socially Integrated (04/11/2023)   Social Connection and Isolation Panel [NHANES]    Frequency of Communication with Friends and Family: More than three times a week    Frequency of Social Gatherings with Friends and Family: More than three times a week    Attends Religious Services: More than 4 times per year    Active Member of [employer] or Organizations: Yes    Attends Banker Meetings: More than 4 times per year    Marital Status: Married  Catering manager Violence: Not  At Risk (04/11/2023)   Humiliation, Afraid, Rape, and Kick questionnaire    Fear of Current or Ex-Partner: No    Emotionally Abused: No    Physically Abused: No    Sexually Abused: No    Family History  Problem Relation Age of Onset   Heart disease Father    Kidney failure Father    Kidney disease Father    Diabetes Maternal Grandmother    Breast cancer Maternal Grandmother    Valvular heart disease Mother    Liver cancer Paternal Uncle    Liver cancer Paternal Grandmother    Prostate cancer Neg Hx      Current Outpatient Medications:    albuterol  (PROVENTIL ) (2.5 MG/3ML) 0.083% nebulizer solution, Take 3 mLs (2.5 mg total) by nebulization every 6 (six) hours as needed for wheezing or shortness of breath., Disp: 150 mL, Rfl: 0   albuterol  (VENTOLIN  HFA) 108 (90 Base) MCG/ACT inhaler, Inhale 2 puffs into the lungs every 6 (six) hours as needed for wheezing or shortness of breath., Disp: 6.7 g, Rfl: 5   amoxicillin  (AMOXIL ) 500 MG capsule, Take 4 capsules (2,000 mg total) by mouth 1 hour before dental appt (Patient not taking: Reported on 07/29/2023), Disp: 4 capsule, Rfl: 5   apixaban  (ELIQUIS ) 5 MG TABS tablet, Take 1 tablet (5 mg total) by mouth 2 (two) times daily., Disp: 180 tablet, Rfl: 3   benzonatate  (TESSALON ) 200 MG capsule, Take 1 capsule (200 mg total) by mouth 3 (three) times daily as needed for cough., Disp: 60 capsule, Rfl: 0   buPROPion  (WELLBUTRIN  SR) 150 MG 12 hr tablet, Take 1 tablet (150 mg total) by mouth 2 (two) times daily., Disp: 180 tablet, Rfl: 3   colchicine  0.6 MG tablet, Take 1 tablet (0.6 mg total) by mouth every other day., Disp: 45 tablet, Rfl: 1   Continuous Glucose Sensor (FREESTYLE LIBRE 3 SENSOR) MISC, Use one every 2 weeks., Disp: 6 each, Rfl: 3   Cyanocobalamin  (B-12 PO),  Take 1 tablet by mouth daily., Disp: , Rfl:    dapagliflozin  propanediol (FARXIGA ) 10 MG TABS tablet, Take 1 tablet (10 mg total) by mouth daily before breakfast., Disp: 90 tablet,  Rfl: 3   febuxostat  (ULORIC ) 40 MG tablet, Take 3 tablets (120 mg total) by mouth daily., Disp: 90 tablet, Rfl: 5   fluorouracil  (EFUDEX ) 5 % cream, Apply topically 2 (two) times daily for 60 days Apply to anus starting after laser surgery for 8 weeks to prevent recurrent lesions (Patient not taking: Reported on 07/29/2023), Disp: 40 g, Rfl: 2   fluticasone  (FLONASE ) 50 MCG/ACT nasal spray, Place 2 sprays into both nostrils daily., Disp: 16 g, Rfl: 11   gabapentin  (NEURONTIN ) 300 MG capsule, Take 1 capsule (300 mg total) by mouth 2 (two) times daily., Disp: 180 capsule, Rfl: 3   insulin  glargine-yfgn (SEMGLEE ) 100 UNIT/ML Pen, Inject 20 Units into the skin daily., Disp: 45 mL, Rfl: 3   Insulin  Pen Needle (TECHLITE PEN NEEDLES) 31G X 5 MM MISC, 4 (four) times daily., Disp: 400 each, Rfl: 3   Insulin  Pen Needle (TECHLITE PEN NEEDLES) 32G X 6 MM MISC, Use 4 times a day, Disp: 100 each, Rfl: 3   Insulin  Pen Needle (UNIFINE PENTIPS) 31G X 5 MM MISC, Use 4 times daily as directed, Disp: 400 each, Rfl: 3   losartan  (COZAAR ) 25 MG tablet, Take 1 tablet (25 mg total) by mouth daily., Disp: 90 tablet, Rfl: 3   metolazone  (ZAROXOLYN ) 2.5 MG tablet, Take 1 tablet (2.5 mg total) by mouth as directed. By the heart failure clinic, Disp: 3 tablet, Rfl: 0   montelukast  (SINGULAIR ) 10 MG tablet, Take 1 tablet (10 mg total) by mouth at bedtime., Disp: 30 tablet, Rfl: 11   mycophenolate  (MYFORTIC ) 180 MG EC tablet, Take 2 tablets (360 mg total) by mouth every morning AND 1 tablet (180 mg total) every evening., Disp: 360 tablet, Rfl: 3   ofloxacin  (OCUFLOX ) 0.3 % ophthalmic solution, Instill 1 drop into both eyes three times a day (Patient not taking: Reported on 07/25/2023), Disp: 5 mL, Rfl: 0   omeprazole  (PRILOSEC) 20 MG capsule, Take 1 capsule (20 mg total) by mouth daily., Disp: 30 capsule, Rfl: 11   potassium chloride  SA (KLOR-CON  M) 20 MEQ tablet, Take 1 tablet (20 mEq total) by mouth daily as needed., Disp: 90 tablet,  Rfl: 3   predniSONE  (DELTASONE ) 5 MG tablet, Take 1 tablet (5 mg total) by mouth daily., Disp: 90 tablet, Rfl: 3   rosuvastatin  (CRESTOR ) 10 MG tablet, Take 1 tablet (10 mg total) by mouth daily., Disp: 90 tablet, Rfl: 3   sulfamethoxazole -trimethoprim  (BACTRIM ) 400-80 MG tablet, Take 1 tablet by mouth 3 (three) times a week., Disp: 36 tablet, Rfl: 3   tacrolimus  (PROGRAF ) 1 MG capsule, Take 2 capsules (2 mg total) by mouth every morning AND 1 capsule (1 mg total) every evening., Disp: 90 capsule, Rfl: 11   tamsulosin  (FLOMAX ) 0.4 MG CAPS capsule, Take 1 capsule (0.4 mg total) by mouth daily., Disp: 30 capsule, Rfl: 11   Testosterone  20.25 MG/ACT (1.62%) GEL, Place 2 Pump onto the skin daily., Disp: 150 g, Rfl: 3   tirzepatide  (MOUNJARO ) 5 MG/0.5ML Pen, Inject 5 mg into the skin once a week., Disp: 2 mL, Rfl: 5   tiZANidine  (ZANAFLEX ) 2 MG tablet, Take 1 tablet (2 mg total) by mouth every 6 (six) hours as needed for muscle spasms., Disp: 30 tablet, Rfl: 1   torsemide  (DEMADEX ) 20 MG tablet,  Take 4 tablets (80 mg total) by mouth 2 (two) times daily., Disp: 240 tablet, Rfl: 3   traMADol  (ULTRAM ) 50 MG tablet, Take 1 tablet (50 mg total) by mouth every 6 (six) hours as needed for pain., Disp: 20 tablet, Rfl: 0   Zinc  50 MG TABS, Take 1 tablet (50 mg total) by mouth daily., Disp: 100 tablet, Rfl: 3  No results found.  No images are attached to the encounter.      Latest Ref Rng & Units 08/05/2023   10:03 AM  CMP  Glucose 70 - 99 mg/dL 409   BUN 8 - 23 mg/dL 85   Creatinine 8.11 - 1.24 mg/dL 9.14   Sodium 782 - 956 mmol/L 142   Potassium 3.5 - 5.1 mmol/L 3.8   Chloride 98 - 111 mmol/L 109   CO2 22 - 32 mmol/L 23   Calcium  8.9 - 10.3 mg/dL 9.3       Latest Ref Rng & Units 08/07/2023    9:03 AM  CBC  Hematocrit 37.5 - 51.0 % 37.7      Observation/objective: Appears in no acute distress over video visit today.  Breathing is nonlabored  Assessment and plan: Patient is a 63 year old male  and this is a routine follow-up of anemia of chronic kidney disease  Patient received IV iron  in March 2025 and his hemoglobin is actually improved from 9.6-11.5.  Ferritin levels are presently no more than 100 and iron  saturation has improved from 9 to 15%.  Given that patient is complaining of ongoing fatigue it may be reasonable to give him 3 more doses of Venofer  at this time to bring iron  saturation close to 20%.  B12 folate and TSH are within normal limits.  CBC ferritin and iron  studies in 2 4 and 6 months and I will see him back in 6 months  Follow-up instructions:  I discussed the assessment and treatment plan with the patient. The patient was provided an opportunity to ask questions and all were answered. The patient agreed with the plan and demonstrated an understanding of the instructions.   The patient was advised to call back or seek an in-person evaluation if the symptoms worsen or if the condition fails to improve as anticipated.  I provided 12 minutes of face-to-face video visit time during this encounter, and > 50% was spent counseling as documented under my assessment & plan.  Visit Diagnosis: 1. Iron  deficiency anemia, unspecified iron  deficiency anemia type   2. Anemia due to stage 4 chronic kidney disease (HCC)     Dr. Seretha Dance, MD, MPH [facility] at [facility] Tel- [phone] 08/09/2023 4:35 PM

## 2023-08-12 ENCOUNTER — Encounter (INDEPENDENT_AMBULATORY_CARE_PROVIDER_SITE_OTHER): Payer: Self-pay

## 2023-08-14 ENCOUNTER — Other Ambulatory Visit: Payer: Self-pay

## 2023-08-14 ENCOUNTER — Other Ambulatory Visit: Payer: Self-pay | Admitting: Oncology

## 2023-08-14 ENCOUNTER — Inpatient Hospital Stay

## 2023-08-14 VITALS — BP 138/62 | HR 57 | Temp 97.3°F | Resp 18

## 2023-08-14 DIAGNOSIS — G5132 Clonic hemifacial spasm, left: Secondary | ICD-10-CM | POA: Diagnosis not present

## 2023-08-14 DIAGNOSIS — D631 Anemia in chronic kidney disease: Secondary | ICD-10-CM | POA: Diagnosis not present

## 2023-08-14 DIAGNOSIS — D508 Other iron deficiency anemias: Secondary | ICD-10-CM

## 2023-08-14 DIAGNOSIS — N184 Chronic kidney disease, stage 4 (severe): Secondary | ICD-10-CM | POA: Diagnosis not present

## 2023-08-14 MED ORDER — SODIUM CHLORIDE 0.9% FLUSH
10.0000 mL | Freq: Once | INTRAVENOUS | Status: AC | PRN
  Administered 2023-08-14: 10 mL
  Filled 2023-08-14: qty 10

## 2023-08-14 MED ORDER — AZITHROMYCIN 500 MG PO TABS
500.0000 mg | ORAL_TABLET | ORAL | 0 refills | Status: DC
  Filled 2023-08-14: qty 3, 3d supply, fill #0

## 2023-08-14 MED ORDER — IRON SUCROSE 20 MG/ML IV SOLN
200.0000 mg | INTRAVENOUS | Status: DC
  Administered 2023-08-14: 200 mg via INTRAVENOUS

## 2023-08-15 ENCOUNTER — Encounter: Payer: Self-pay | Admitting: Urology

## 2023-08-15 ENCOUNTER — Ambulatory Visit (INDEPENDENT_AMBULATORY_CARE_PROVIDER_SITE_OTHER): Admitting: Urology

## 2023-08-15 ENCOUNTER — Other Ambulatory Visit: Payer: Self-pay

## 2023-08-15 ENCOUNTER — Encounter: Payer: Self-pay | Admitting: Oncology

## 2023-08-15 VITALS — BP 131/67 | HR 65 | Ht 70.0 in | Wt 248.0 lb

## 2023-08-15 DIAGNOSIS — E291 Testicular hypofunction: Secondary | ICD-10-CM | POA: Diagnosis not present

## 2023-08-15 DIAGNOSIS — R3911 Hesitancy of micturition: Secondary | ICD-10-CM

## 2023-08-15 DIAGNOSIS — N401 Enlarged prostate with lower urinary tract symptoms: Secondary | ICD-10-CM

## 2023-08-15 MED ORDER — TADALAFIL 5 MG PO TABS
5.0000 mg | ORAL_TABLET | Freq: Every day | ORAL | 0 refills | Status: DC | PRN
  Filled 2023-08-15: qty 90, 90d supply, fill #0

## 2023-08-15 NOTE — Progress Notes (Signed)
 I, Maysun Jamey Mccallum, acting as a scribe for Carl Knapp, MD., have documented all relevant documentation on the behalf of Carl Knapp, MD, as directed by Carl Knapp, MD while in the presence of Carl Knapp, MD.  08/15/2023 2:58 PM   [name]. 1960-10-05 [id number]  Referring provider: Helaine Llanos, MD [address] [place],  [place] [address]  Chief Complaint  Patient presents with   Benign Prostatic Hypertrophy   Urologic History: BPH with LUTS -Status post UroLift 12/25/2021.   2. Hypogonadism -Maintained with topical testosterone .  HPI: [name]. is a 63 y.o. male presents for annual follow-up.   Saw Arch Ko 10/14/2022 complaining of worsening lower urinary tract symptoms.  IPSS 27/35  He has urinary hesitancy with urge to void, and then states his urine will "let go" and he is able to empty.  Is on tamsulosin .  Good energy level and symptom control on topical testosterone .  Labs 08/07/23 testosterone  345 ng/dL, PSA 1.1, HCT 19.1 PVR at last visit was 39 mL.   PSA trend   Prostate Specific Ag, Serum  Latest Ref Rng 0.0 - 4.0 ng/mL  11/01/2021 1.4   11/14/2021 1.8   04/16/2022 1.0   02/07/2023 1.3   08/07/2023 1.1      PMH: Past Medical History:  Diagnosis Date   Anal condyloma    Anemia    Aortic atherosclerosis (HCC)    Aortic stenosis    a.) TTE 10/2021: Mod AS. AVA 1.06cm^2 (VTI). Mean grad ; b.) TTE 04/02/2022: no AV stenosis   Asthma, persistent controlled 02/25/2013   Attention or concentration deficit 04/26/2021   BPH with obstruction/lower urinary tract symptoms 09/25/2014   CAD (coronary artery disease) 2005   a.) LHC 1996 -> HG stenosis mLAD -> PTCA/PCI with stent x 1 (unk type) -> complicated by CFA pseudoanurysm (required surg);  b.) LHC 08/10/2002  -> 95% LAD, 70% ISR LAD, 80% pOM, 70% mRCA --> CVTS consult; c.) 4v CABG 08/13/2002; c.) 03/2018 MV: Small, fixed inferoapical and apical  sep defect. No isc. EF 47% (60-65% by 05/2018 TTE); d.) 02/2021 MV: small Apical inf fixed defect. No isc -> low risk.   Cardiomegaly    Cellulitis and abscess of left leg 07/22/2020   Chronic atrial fibrillation    a.) CHA2DS2VASc = 4 (CHF, HTN, vascular disease history, T2DM);  b.) rate/rhythm maintained without pharmacological intervention; chronically anticoagulated with apixaban    Chronic combined systolic and diastolic CHF (congestive heart failure)    a.) TTE 7/18 : EF 45-50%; b.) TTE 05/2018 : EF 60-65%, RVSP 74.8; c.) TTE 12/2018: EF 50-55%. Sev dil LA; d.) TTE 04/2019: EF 45-50%; e.) TTE 07/2021: EF 40-45%, G3DD; f.) TTE 10/2021: EF 40-45%, mild conc LVH, septal-lat dyssynchrony (LBBB), mildly red RVSF, mild-mod dil LA, mildly dil RA, mild-mod MS, mod AS; g.) TTE 04/02/2022: EF 40-45%, glob HK, LVH, red RVSF, RVE, sev LAE, mild-mod RAE, mild MR   Chronic venous stasis dermatitis of both lower extremities    Cytomegaloviral disease 2017   Diverticulosis of colon 04/22/2013   Dyspnea    Elevated RIGHT hemidiaphragm    Erectile dysfunction    a.) on topical TRT + Trimix (alprostadil/papaverine/phentolamine) injections   ESRD (end stage renal disease) (HCC)    a.) dialysis dependent 2004-2012; b.) s/p cadaveric RIGHT renal transplant 09/13/2010   Essential hypertension 12/17/2010   GERD (gastroesophageal reflux disease)    Gout  History of 2019 novel coronavirus disease (COVID-19) 06/30/2019   History of bilateral cataract extraction 10/2017   History of renal transplant 09/13/2010   a.) s/p cadaveric donor transplant 09/13/2010   Hx of bilateral cataract extraction 10/2017   Hyperlipidemia    Hypogonadism in male 09/25/2014   Ischemic cardiomyopathy    Left cephalic vein thrombosis 06/2019   Long term current use of anticoagulant    a.) apixaban    Long term current use of immunosuppressive drug    a.) mycophenolate  + prednisone  + tacrolimus    Major depressive disorder 10/04/2013    Mitral stenosis 11/07/2021   a.) TTE 11/07/2021: severe MAC, mild-mod MS (MPG 6 mmHg); b.) TTE 04/02/2022: no MV stenosis   Murmur    Obesity hypoventilation syndrome 03/31/2012   OSA treated with BiPAP 09/29/2012   PAH (pulmonary artery hypertension)    a. 04/2019 RHC: RA 19, RV 80/20, PA 78/31 (51), PCWP 25, CO/CI 7.69/3.26. PVR 3.25 --> Sev PAH, likely primarily PV HTN; b.) RHC 04/04/2022: mRA 13, mPA 40, mPCWP 20, PA sat 59, AO sat 92, CO 7.63, CI 3.26, PVR 2.6 --> mod portal venous HTN   Pancreatitis    S/P CABG x 4 08/13/2002   a.) LIMA-LAD, LRA-OM, SVG-D1, SVG-dRCA   S/P gastric bypass    Synovitis of finger 06/11/2019   Trigger finger, unspecified little finger 12/23/2018   Type 2 diabetes mellitus with hyperglycemia, with long-term current use of insulin  09/09/2014   a.) has FreeStyle Libre CGM   Ulcer    Left shin goes to wound care center at [facility] qweek   Wears glasses    Wears hearing aid in both ears     Surgical History: Past Surgical History:  Procedure Laterality Date   CARDIAC CATHETERIZATION N/A 08/10/2002   CATARACT EXTRACTION W/PHACO Right 10/29/2017   Procedure: CATARACT EXTRACTION PHACO AND INTRAOCULAR LENS PLACEMENT (IOC)  RIGHT DIABETIC;  Surgeon: Annell Kidney, MD;  Location: [facility];  Service: Ophthalmology;  Laterality: Right   CATARACT EXTRACTION W/PHACO Left 11/18/2017   Procedure: CATARACT EXTRACTION PHACO AND INTRAOCULAR LENS PLACEMENT (IOC) LEFT IVA/TOPICAL;  Surgeon: Annell Kidney, MD;  Location: [facility];  Service: Ophthalmology;  Laterality: Left   COLONOSCOPY WITH PROPOFOL  N/A 04/22/2013   Procedure: COLONOSCOPY WITH PROPOFOL ;  Surgeon: Janel Medford, MD;  Location: [facility];  Service: Endoscopy;  Laterality: N/A;   CORONARY ANGIOPLASTY WITH STENT PLACEMENT N/A 1996   CORONARY ARTERY BYPASS GRAFT N/A 08/13/2002   Procedure: 4v CORONARY ARTERY BYPASS GRAFTING; Location: [facility]; Surgeon: Ruffus Couch, MD   CYSTOSCOPY WITH INSERTION OF UROLIFT N/A 12/25/2021   Procedure: CYSTOSCOPY WITH INSERTION OF UROLIFT;  Surgeon: Carl Knapp, MD;  Location: [facility];  Service: Urology;  Laterality: N/A;   DG ANGIO AV SHUNT*L*     right and left upper arms   FASCIOTOMY  03/03/2012   Procedure: FASCIOTOMY;  Surgeon: Kemp Patter, MD;  Location: [facility];  Service: Orthopedics;  Laterality: Right;  FASCIOTOMY RIGHT SMALL FINGER   FASCIOTOMY Left 08/17/2013   Procedure: FASCIOTOMY LEFT RING;  Surgeon: Kemp Patter, MD;  Location: [facility];  Service: Orthopedics;  Laterality: Left;   INCISION AND DRAINAGE ABSCESS Left 10/15/2015   Procedure: INCISION AND DRAINAGE ABSCESS;  Surgeon: Alben Alma, MD;  Location: [facility];  Service: General;  Laterality: Left;   KIDNEY TRANSPLANT  09/13/2010   cadaver--at [facility]   LASER ABLATION CONDOLAMATA N/A 01/08/2023   Procedure:  LASER REMOVAL ABLATION OF CONDYLOMATA;  Surgeon: Candyce Champagne, MD;  Location: [facility];  Service: General;  Laterality: N/A;  GEN AND LOCAL   RECTAL EXAM UNDER ANESTHESIA N/A 01/08/2023   Procedure: ANORECTAL EXAM UNDER ANESTHESIA;  Surgeon: Candyce Champagne, MD;  Location: [facility];  Service: General;  Laterality: N/A;   RIGHT HEART CATH N/A 11/15/2016   Procedure: RIGHT HEART CATH;  Surgeon: Wenona Hamilton, MD;  Location: [facility];  Service: Cardiovascular;  Laterality: N/A;   RIGHT HEART CATH N/A 05/03/2019   Procedure: RIGHT HEART CATH;  Surgeon: Darlis Eisenmenger, MD;  Location: [facility];  Service: Cardiovascular;  Laterality: N/A;   RIGHT HEART CATH N/A 04/04/2022   Procedure: RIGHT HEART CATH;  Surgeon: Darlis Eisenmenger, MD;  Location: [facility];  Service: Cardiovascular;  Laterality: N/A;   ROUX-EN-Y GASTRIC BYPASS N/A 2015   TYMPANIC MEMBRANE REPAIR Left 03/2010   VASECTOMY     WART FULGURATION N/A 05/31/2022   Procedure: FULGURATION ANAL WART;  Surgeon: Conrado Delay, DO;  Location: [facility];  Service: General;  Laterality: N/A;    Home Medications:  Allergies as of 08/15/2023       Reactions   Contrast Media [iodinated Contrast Media] Other (See Comments)   Kidney transplant   Iodine Other (See Comments)   Kidney transplant   Nsaids Other (See Comments)   Kidney transplant   Ibuprofen Other (See Comments)   Due to kidney transplant        Medication List        Accurate as of Aug 15, 2023  2:58 PM. If you have any questions, ask your nurse or doctor.          STOP taking these medications    amoxicillin  500 MG capsule Commonly known as: AMOXIL  Stopped by: Armstead Heiland C Keymani Glynn   fluorouracil  5 % cream Commonly known as: EFUDEX  Stopped by: [name]       TAKE these medications    albuterol  (2.5 MG/3ML) 0.083% nebulizer solution Commonly known as: PROVENTIL  Inhale one vial (3 ml) via nebulizer every 6 hours as needed for wheezing or shortness of breath (Take 3 mLs (2.5 mg total) by nebulization every 6 (six) hours as needed for wheezing or shortness of breath.)   albuterol  108 (90 Base) MCG/ACT inhaler Commonly known as: VENTOLIN  HFA Inhale 2 puffs into the lungs every 6 (six) hours as needed for wheezing or shortness of breath.   azithromycin  500 MG tablet Commonly known as: ZITHROMAX  Take 1 tablet (500 mg total) by mouth the night before surgery OR 1 hour prior to surgery.   B-12 PO Take 1 tablet by mouth daily.   benzonatate  200 MG capsule Commonly known as: TESSALON  Take 1 capsule (200 mg total) by mouth 3 (three) times daily as needed for cough.   buPROPion  150 MG 12 hr tablet Commonly known as: WELLBUTRIN  SR Take 1 tablet (150 mg total) by mouth 2 (two) times daily.   colchicine  0.6 MG tablet Take 1 tablet (0.6 mg total) by mouth every other day.   Eliquis  5 MG Tabs tablet Generic drug: apixaban  Take 1 tablet (5 mg total) by mouth 2 (two) times daily.   Farxiga  10 MG Tabs tablet Generic drug:  dapagliflozin  propanediol Take 1 tablet (10 mg total) by mouth daily before breakfast.   febuxostat  40 MG tablet Commonly known as: ULORIC  Take 3 tablets (120 mg total) by mouth daily.   fluticasone   50 MCG/ACT nasal spray Commonly known as: FLONASE  Place 2 sprays into both nostrils daily.   FreeStyle Libre 3 Sensor Misc Use one every 2 weeks.   gabapentin  300 MG capsule Commonly known as: NEURONTIN  Take 1 capsule (300 mg total) by mouth 2 (two) times daily.   losartan  25 MG tablet Commonly known as: COZAAR  Take 1 tablet (25 mg total) by mouth daily.   metolazone  2.5 MG tablet Commonly known as: ZAROXOLYN  Take 1 tablet (2.5 mg total) by mouth as directed. By the heart failure clinic   montelukast  10 MG tablet Commonly known as: SINGULAIR  Take 1 tablet (10 mg total) by mouth at bedtime.   Mounjaro  5 MG/0.5ML Pen Generic drug: tirzepatide  Inject 5 mg into the skin once a week.   mycophenolate  180 MG EC tablet Commonly known as: MYFORTIC  Take 2 tablets (360 mg total) by mouth every morning AND 1 tablet (180 mg total) every evening.   ofloxacin  0.3 % ophthalmic solution Commonly known as: OCUFLOX  Instill 1 drop into both eyes three times a day   omeprazole  20 MG capsule Commonly known as: PRILOSEC Take 1 capsule (20 mg total) by mouth daily.   potassium chloride  SA 20 MEQ tablet Commonly known as: KLOR-CON  M Take 1 tablet (20 mEq total) by mouth daily as needed.   predniSONE  5 MG tablet Commonly known as: DELTASONE  Take 1 tablet (5 mg total) by mouth daily.   rosuvastatin  10 MG tablet Commonly known as: CRESTOR  Take 1 tablet (10 mg total) by mouth daily.   Semglee  (yfgn) 100 UNIT/ML Pen Generic drug: insulin  glargine-yfgn Inject 20 Units into the skin daily.   sulfamethoxazole -trimethoprim  400-80 MG tablet Commonly known as: BACTRIM  Take 1 tablet by mouth 3 (three) times a week.   tacrolimus  1 MG capsule Commonly known as: PROGRAF  Take 2 capsules (2 mg  total) by mouth every morning AND 1 capsule (1 mg total) every evening.   tadalafil  5 MG tablet Commonly known as: CIALIS  Take 1 tablet (5 mg total) by mouth daily as needed for erectile dysfunction. Started by: [name]   tamsulosin  0.4 MG Caps capsule Commonly known as: FLOMAX  Take 1 capsule (0.4 mg total) by mouth daily.   Testosterone  20.25 MG/ACT (1.62%) Gel Place 2 Pump onto the skin daily.   tiZANidine  2 MG tablet Commonly known as: ZANAFLEX  Take 1 tablet (2 mg total) by mouth every 6 (six) hours as needed for muscle spasms.   torsemide  20 MG tablet Commonly known as: DEMADEX  Take 4 tablets (80 mg total) by mouth 2 (two) times daily.   traMADol  50 MG tablet Commonly known as: ULTRAM  Take 1 tablet (50 mg total) by mouth every 6 (six) hours as needed for pain.   Unifine Pentips 31G X 5 MM Misc Generic drug: Insulin  Pen Needle Use 4 times daily as directed   Insupen Pen Needles 31G X 5 MM Misc Generic drug: Insulin  Pen Needle 4 (four) times daily.   TechLite Pen Needles 32G X 6 MM Misc Generic drug: Insulin  Pen Needle Use 4 times a day   Zinc  50 MG Tabs Take 1 tablet (50 mg total) by mouth daily.        Allergies:  Allergies  Allergen Reactions   Contrast Media [Iodinated Contrast Media] Other (See Comments)    Kidney transplant   Iodine Other (See Comments)    Kidney transplant   Nsaids Other (See Comments)    Kidney transplant   Ibuprofen Other (See Comments)  Due to kidney transplant    Family History: Family History  Problem Relation Age of Onset   Heart disease Father    Kidney failure Father    Kidney disease Father    Diabetes Maternal Grandmother    Breast cancer Maternal Grandmother    Valvular heart disease Mother    Liver cancer Paternal Uncle    Liver cancer Paternal Grandmother    Prostate cancer Neg Hx     Social History:  reports that he quit smoking about 28 years ago. His smoking use included cigars. He has never  been exposed to tobacco smoke. He has never used smokeless tobacco. He reports that he does not currently use alcohol. He reports that he does not use drugs.   Physical Exam: BP 131/67   Pulse 65   Ht 5\' 10"  (1.778 m)   Wt 248 lb (112.5 kg)   BMI 35.58 kg/m   Constitutional:  Alert and oriented, No acute distress. HEENT: [place] AT, moist mucus membranes.  Trachea midline, no masses. Cardiovascular: No clubbing, cyanosis, or edema. Respiratory: Normal respiratory effort, no increased work of breathing. GI: Abdomen is soft, nontender, nondistended, no abdominal masses Skin: No rashes, bruises or suspicious lesions. Neurologic: Grossly intact, no focal deficits, moving all 4 extremities. Psychiatric: Normal mood and affect.   Assessment & Plan:    1. BPH with LUTS Worsening voiding symptoms with obstructive symptoms noted. Patient reports urinary hesitancy followed by the ability to empty. Schedule a cystoscopy to evaluate for any structural abnormalities or complications from previous radiation exposure. Patient inquired about taking Tadalafil ; confirmed he is not on sublingual nitrates. Prescription for Tadalafil  5 mg sent to pharmacy.  2. Hypogonadism Stable on TRT.  Lab visit 6 month teststerone and H/H  Office visit 1 year with testosterone , H/H, PSA.  [facility] [address] [place], [place] [address] [phone]

## 2023-08-15 NOTE — Patient Instructions (Signed)
 Cystoscopy ?Cystoscopy is a procedure that is used to help diagnose and sometimes treat conditions that affect the lower urinary tract. The lower urinary tract includes the bladder and the urethra. The urethra is the tube that drains urine from the bladder. Cystoscopy is done using a thin, tube-shaped instrument with a light and camera at the end (cystoscope). The cystoscope may be hard or flexible, depending on the goal of the procedure. The cystoscope is inserted through the urethra, into the bladder. ?Cystoscopy may be recommended if you have: ?Urinary tract infections that keep coming back. ?Blood in the urine (hematuria). ?An inability to control when you urinate (urinary incontinence) or an overactive bladder. ?Unusual cells found in a urine sample. ?A blockage in the urethra, such as a urinary stone. ?Painful urination. ?An abnormality in the bladder found during an intravenous pyelogram (IVP) or CT scan. ?What are the risks? ?Generally, this is a safe procedure. However, problems may occur, including: ?Infection. ?Bleeding. ? ?What happens during the procedure? ? ?You will be given one or more of the following: ?A medicine to numb the area (local anesthetic). ?The area around the opening of your urethra will be cleaned. ?The cystoscope will be passed through your urethra into your bladder. ?Germ-free (sterile) fluid will flow through the cystoscope to fill your bladder. The fluid will stretch your bladder so that your health care provider can clearly examine your bladder walls. ?Your doctor will look at the urethra and bladder. ?The cystoscope will be removed ?The procedure may vary among health care providers  ?What can I expect after the procedure? ?After the procedure, it is common to have: ?Some soreness or pain in your urethra. ?Urinary symptoms. These include: ?Mild pain or burning when you urinate. Pain should stop within a few minutes after you urinate. This may last for up to a few days after the  procedure. ?A small amount of blood in your urine for several days. ?Feeling like you need to urinate but producing only a small amount of urine. ?Follow these instructions at home: ?General instructions ?Return to your normal activities as told by your health care provider.  ?Drink plenty of fluids after the procedure. ?Keep all follow-up visits as told by your health care provider. This is important. ?Contact a health care provider if you: ?Have pain that gets worse or does not get better with medicine, especially pain when you urinate lasting longer than 72 hours after the procedure. ?Have trouble urinating. ?Get help right away if you: ?Have blood clots in your urine. ?Have a fever or chills. ?Are unable to urinate. ?Summary ?Cystoscopy is a procedure that is used to help diagnose and sometimes treat conditions that affect the lower urinary tract. ?Cystoscopy is done using a thin, tube-shaped instrument with a light and camera at the end. ?After the procedure, it is common to have some soreness or pain in your urethra. ?It is normal to have blood in your urine after the procedure.  ?If you were prescribed an antibiotic medicine, take it as told by your health care provider.  ?This information is not intended to replace advice given to you by your health care provider. Make sure you discuss any questions you have with your health care provider. ?Document Revised: 03/03/2018 Document Reviewed: 03/03/2018 ?Elsevier Patient Education ? 2020 [employer] ? ?

## 2023-08-15 NOTE — Progress Notes (Signed)
 error

## 2023-08-18 ENCOUNTER — Other Ambulatory Visit: Payer: Self-pay

## 2023-08-19 ENCOUNTER — Other Ambulatory Visit: Payer: Medicare Other

## 2023-08-19 ENCOUNTER — Ambulatory Visit: Payer: Medicare Other | Admitting: Oncology

## 2023-08-19 ENCOUNTER — Other Ambulatory Visit: Payer: Self-pay

## 2023-08-20 ENCOUNTER — Telehealth: Payer: Self-pay | Admitting: Cardiology

## 2023-08-20 NOTE — Telephone Encounter (Signed)
 Called to confirm/remind patient of their appointment [facility] on [facility].   Appointment:   [x] Confirmed  [] Left mess   [] No answer/No voice mail  [] VM Full/unable to leave message  [] Phone not in service  Patient reminded to bring all medications and/or complete list.  Confirmed patient has transportation. Gave directions, instructed to utilize valet parking.

## 2023-08-21 ENCOUNTER — Inpatient Hospital Stay

## 2023-08-21 ENCOUNTER — Encounter: Payer: Self-pay | Admitting: Oncology

## 2023-08-21 ENCOUNTER — Other Ambulatory Visit: Payer: Self-pay

## 2023-08-21 ENCOUNTER — Ambulatory Visit: Attending: Cardiology | Admitting: Cardiology

## 2023-08-21 ENCOUNTER — Encounter: Payer: Self-pay | Admitting: Cardiology

## 2023-08-21 VITALS — BP 125/78 | HR 55 | Temp 97.0°F | Resp 16

## 2023-08-21 VITALS — BP 128/63 | HR 50 | Ht 70.0 in | Wt 245.0 lb

## 2023-08-21 DIAGNOSIS — D649 Anemia, unspecified: Secondary | ICD-10-CM | POA: Diagnosis not present

## 2023-08-21 DIAGNOSIS — R001 Bradycardia, unspecified: Secondary | ICD-10-CM | POA: Diagnosis not present

## 2023-08-21 DIAGNOSIS — I272 Pulmonary hypertension, unspecified: Secondary | ICD-10-CM | POA: Diagnosis not present

## 2023-08-21 DIAGNOSIS — Z7985 Long-term (current) use of injectable non-insulin antidiabetic drugs: Secondary | ICD-10-CM | POA: Insufficient documentation

## 2023-08-21 DIAGNOSIS — I482 Chronic atrial fibrillation, unspecified: Secondary | ICD-10-CM | POA: Insufficient documentation

## 2023-08-21 DIAGNOSIS — Z87891 Personal history of nicotine dependence: Secondary | ICD-10-CM | POA: Diagnosis not present

## 2023-08-21 DIAGNOSIS — Z79624 Long term (current) use of inhibitors of nucleotide synthesis: Secondary | ICD-10-CM | POA: Diagnosis not present

## 2023-08-21 DIAGNOSIS — G4733 Obstructive sleep apnea (adult) (pediatric): Secondary | ICD-10-CM | POA: Insufficient documentation

## 2023-08-21 DIAGNOSIS — N184 Chronic kidney disease, stage 4 (severe): Secondary | ICD-10-CM | POA: Diagnosis not present

## 2023-08-21 DIAGNOSIS — I5032 Chronic diastolic (congestive) heart failure: Secondary | ICD-10-CM | POA: Diagnosis not present

## 2023-08-21 DIAGNOSIS — I252 Old myocardial infarction: Secondary | ICD-10-CM | POA: Insufficient documentation

## 2023-08-21 DIAGNOSIS — Z7984 Long term (current) use of oral hypoglycemic drugs: Secondary | ICD-10-CM | POA: Diagnosis not present

## 2023-08-21 DIAGNOSIS — Z951 Presence of aortocoronary bypass graft: Secondary | ICD-10-CM | POA: Diagnosis not present

## 2023-08-21 DIAGNOSIS — Z79899 Other long term (current) drug therapy: Secondary | ICD-10-CM | POA: Diagnosis not present

## 2023-08-21 DIAGNOSIS — I2510 Atherosclerotic heart disease of native coronary artery without angina pectoris: Secondary | ICD-10-CM | POA: Insufficient documentation

## 2023-08-21 DIAGNOSIS — Z79621 Long term (current) use of calcineurin inhibitor: Secondary | ICD-10-CM | POA: Diagnosis not present

## 2023-08-21 DIAGNOSIS — Z7901 Long term (current) use of anticoagulants: Secondary | ICD-10-CM | POA: Diagnosis not present

## 2023-08-21 DIAGNOSIS — E1122 Type 2 diabetes mellitus with diabetic chronic kidney disease: Secondary | ICD-10-CM | POA: Insufficient documentation

## 2023-08-21 DIAGNOSIS — I255 Ischemic cardiomyopathy: Secondary | ICD-10-CM | POA: Insufficient documentation

## 2023-08-21 DIAGNOSIS — D508 Other iron deficiency anemias: Secondary | ICD-10-CM

## 2023-08-21 DIAGNOSIS — I130 Hypertensive heart and chronic kidney disease with heart failure and stage 1 through stage 4 chronic kidney disease, or unspecified chronic kidney disease: Secondary | ICD-10-CM | POA: Diagnosis not present

## 2023-08-21 DIAGNOSIS — Z794 Long term (current) use of insulin: Secondary | ICD-10-CM | POA: Insufficient documentation

## 2023-08-21 DIAGNOSIS — Z940 Kidney transplant status: Secondary | ICD-10-CM | POA: Diagnosis not present

## 2023-08-21 DIAGNOSIS — D631 Anemia in chronic kidney disease: Secondary | ICD-10-CM | POA: Diagnosis not present

## 2023-08-21 DIAGNOSIS — E559 Vitamin D deficiency, unspecified: Secondary | ICD-10-CM | POA: Diagnosis not present

## 2023-08-21 MED ORDER — IRON SUCROSE 20 MG/ML IV SOLN
200.0000 mg | INTRAVENOUS | Status: DC
  Administered 2023-08-21: 200 mg via INTRAVENOUS

## 2023-08-21 MED ORDER — TADALAFIL (PAH) 20 MG PO TABS
10.0000 mg | ORAL_TABLET | Freq: Every day | ORAL | 0 refills | Status: DC
  Filled 2023-08-21 – 2023-09-09 (×2): qty 45, 90d supply, fill #0
  Filled 2023-09-21: qty 15, 30d supply, fill #0

## 2023-08-21 NOTE — Progress Notes (Signed)
 Advanced [facility] Clinic Note   Date:  08/21/2023   ID:  [name]., DOB December 02, 1960, MRN [id number]   Provider location: [address], [place] Type of Visit:  Established patient  PCP:  Helaine Llanos, MD  Cardiologist:  Dr. Alvenia Aus Nephrology: Dr. Rochelle Chu  HF Cardiologist: Dr. Mitzie Anda  Chief complaint: CHF  HPI: [name]. is a 63 y.o. male  with a history of CAD s/p CABG 2005, chronic atrial fibrillation on Eliquis , ESRD s/p renal transplant 2012, anc chronic diastolic CHF.  His renal function has been fairly stable, most recent creatinine 1.43.  He has been struggling with volume overload/CHF.  He has a history of ischemic cardiomyopathy with EF as low as 35-40% in the past but echo in 3/20 showed EF 60-65%.  There is evidence for RV failure with pulmonary hypertension by echo.    Admitted 11/05/18 low grade fever and shortness of breath. HS Trop negative. Covid 19 negative. Blood CX negative. Placed on antibiotics and discharged to home the next day.   Admitted 05/03/19 with A/C Diastolic HF. Diuresed with IV lasix  and transitioned to torsemide  80/40 mg . Discharge weight 252 pounds.   Admitted 06/2019 with left arm pain. Left upper extremity venous Doppler revealed occluded cephalic vein at the AV fistula outflow tract in the antecubital fossa with no other thrombus identified. Vascular evaluated. He continued on Eliquis .  No plan for intervention. Also treated for acute gout flare. Discharged on prednisone  taper.     Given IV lasix  on 12/21 by HF Paramedicine.   Admitted to [facility] 2/22 with acute gout flare vs cellulitis. Nephrology was consulted to evaluate transplant regimen and follow along. He was treated with antibiotics and prednisone . Discharged on decreased torsemide  dose of 40 mg daily. Weight 259 lbs.  Admitted 4/22 for left leg cellulitis after sustaining a dog scratch, received IV antibiotics. No changes to his diuretic  therapy. Discharge weight 266 lbs.  Follow up with Dr. Alvenia Aus, 1/23, he had CP and underwent Lexiscan  myoview , which overall was a low risk study with evidence of prior infarct in the infero-apical area with no significant change from before. Losartan  added to GDMT regimen.  Follow up 5/23, volume overloaded, REDs 40%. Advised taking additional metolazone  (3 doses that week), however labs showed worsening renal function and advised to keep at 2 doses.  Admitted in 5/23 with acute on chronic CHF and AKI.  Spiro, Farxiga  and losartan  stopped. Diuresed with IV lasix . Echo showed EF 40-45%, RV mildly reduced, GIIIDD (restrictive), IVC dilated. Nephrology consulted for diuretic dosing. Discharged home, weight 266 lbs.  He had chest pain in 8/23 and was admitted at [facility], minimal HS-TnI elevation with no trend, thought to be demand ischemia from volume overload.    Echo 8/23 showing EF 40-45%, diffuse hypokinesis with dyssynchrony, mildly dilated RV with mildly decreased systolic function, IVC dilated, moderate AS with mean gradient 15, mild-moderate mitral stenosis with mean gradient 6.   Follow up 03/27/22, volume overloaded with 25 lbs weight gain. Instructed to use Furoscix  x 3 days, then resume home dose of torsemide  + metolazone . Clinic weight 277 lbs.  Admitted from [facility] 04/01/22 with a/c CHF. Echo showed EF 40-45%, RV moderately reduced and RVSP 49 mmHg. RHC showed elevated filling pressures, moderate primarily pulmonary venous hypertension and preserved CO. Tadalafil  stopped with primarily PH group 2 and 3. He was diuresed with IV lasix  and metolazone , and GDMT added back. Overall down  19 lbs. He was discharged home, weight 255 lbs.  Patient was admitted in 3/24 with Serratia bacteremia/pyelonephritis, treated with ertapenem .  He also had campylobacter diarrhea.  In 4/24, he tripped getting off his riding lawnmower and fell, fracturing ribs. He had a pulmonary contusion with this.   Patient was  admitted with slurred speech and profuse diarrhea in 11/24.  MRI brain showed no acute stroke.  He had AKI on CKD stage IV with creatinine up to 4.3, presentation thought to be due to diarrhea with dehydration related to use of Mounjaro .  He was given IV fluid and diuretics were stopped.  Losartan  and spironolactone  were stopped. He was sent home without diuretics.   Echo in 12/24 was stable with EF 45-50%, low normal RV function, mild MR.  Zio monitor for bradycardia in 12/24 showed average HR 52, no pauses, 4.7% PVCs.   Patient fell while on a cruise in 1/25 and broke ribs on the right (mechanical fall).  He was later admitted in 1/25 with influenza A and suspected development of secondary bacterial PNA.    Today he returns for HF follow up. Weight down 3 lbs.  Doing well symptomatically, walks for about a mile several days/week without dyspnea.  OK walking up hills.  Mainly limited by knee pain.  No chest pain.  No lightheadedness or syncope.  Using CPAP regularly. No metolazone  use.   REDS clip 34%  ECG (personally reviewed): atrial fibrillation rate 59, IVCD 166 msec  Labs (1/24): K 4.6, creatinine 2.44 Labs (2/24): K 3.4, creatinine 2.75, LDL 25 Labs (4/24): K 4.1, creatinine 2.46 Labs (5/24): K 2.7, creatinine 2.25, IgG monoclonal protein on myeloma panel.  Labs (6/24): K 3.8, creatinine 2.58 Labs (9/24): BNP 265, K 4.1, creatinine 2.83, LFTs normal, hgb 13.6 Labs (11/24): K 3.3 => 3.7, creatinine 4.3 => 1.88 => 2.08 Labs (1/25): LDL 17, K 4.9, creatinine 3.3 Labs (5/25): BNP 241, K 3.8, creatinine 2.59  PMH: 1. CAD: s/p CABG x 4 in [place]  in 2005.  - Cardiolite (1/20): EF 47%, small fixed apical septal defect, no ischemia.  Low risk study.  - Cardiolite (12/22): small apical inferior defect, no changes from prior, no ischemia. Low risk study. 2. Asthma 3. Gout 4. Atrial fibrillation: Chronic.  5. ESRD s/p renal transplantation at [facility] in 2012.  6. Anemia of chronic  disease 7. Type II diabetes 8. HTN 9. Hyperlipidemia 10. OSA: On bipap.  11. Ischemic cardiomyopathy: EF 35-40% in past.  - Cardiolite (1/20) with EF 47%.  - Echo (1/20): EF 50-55%, mild MR, RV moderately dilated with moderately decreased systolic function, PASP 73 mmHg, moderate TR.   - Echo (3/20): EF 60-65%, PASP 75 mmHg, mildly dilated RV with mildly decreased systolic function.  - Echo (2/21): EF 45-50%, global HK, moderately dilated RV with moderately decreased systolic function, D-shaped interventricular septum, dilated IVC.  - RHC (2/21): mean RA 19, PA 78/31 mean 51, mean PCWP 26, CI 3.26, PVR 3.25 - Echo (5/23): EF 40-45%, RV mildly reduced, GIIIDD (restrictive) - Echo (8/23): EF 40-45%, diffuse hypokinesis with dyssynchrony, mildly dilated RV with mildly decreased systolic function, IVC dilated, moderate AS with mean gradient 15, mild-moderate mitral stenosis with mean gradient 6.  - Echo (1/24): Echo showed EF 40-45%, RV moderately reduced and RVSP 49 mmHg, mild MR, no AS, dilated IVC.  - RHC (1/24): RA mean 13, PA 62/24 (mean 40), PCWP mean 20, CO/CI (Fick) 7.63/3.26, PVR 2.6 WU, PAPi 2.9 - Echo (12/24): EF  45-50%, low normal RV function, mild MR. 12. Obesity: s/p gastric bypass in 2015.  13. Bradycardia - Zio monitor (12/24): average HR 52, no pauses, 4.7% PVCs 14. Aortic stenosis: Moderate on 8/23 echo.  15. Mitral stenosis: Mild to moderate on 8/23 echo.  16. Rheumatoid arthritis 17. IgG monoclonal gammopathy 18. PAD: ABIs (10/24) with noncompressible ABIs, normal TBIs.  19. H/o Bell's palsy  Past Surgical History:  Procedure Laterality Date   CARDIAC CATHETERIZATION N/A 08/10/2002   CATARACT EXTRACTION W/PHACO Right 10/29/2017   Procedure: CATARACT EXTRACTION PHACO AND INTRAOCULAR LENS PLACEMENT (IOC)  RIGHT DIABETIC;  Surgeon: Annell Kidney, MD;  Location: [facility];  Service: Ophthalmology;  Laterality: Right   CATARACT EXTRACTION W/PHACO Left  11/18/2017   Procedure: CATARACT EXTRACTION PHACO AND INTRAOCULAR LENS PLACEMENT (IOC) LEFT IVA/TOPICAL;  Surgeon: Annell Kidney, MD;  Location: [facility];  Service: Ophthalmology;  Laterality: Left   COLONOSCOPY WITH PROPOFOL  N/A 04/22/2013   Procedure: COLONOSCOPY WITH PROPOFOL ;  Surgeon: Janel Medford, MD;  Location: [facility];  Service: Endoscopy;  Laterality: N/A;   CORONARY ANGIOPLASTY WITH STENT PLACEMENT N/A 1996   CORONARY ARTERY BYPASS GRAFT N/A 08/13/2002   Procedure: 4v CORONARY ARTERY BYPASS GRAFTING; Location: [facility]; Surgeon: Ruffus Couch, MD   CYSTOSCOPY WITH INSERTION OF UROLIFT N/A 12/25/2021   Procedure: CYSTOSCOPY WITH INSERTION OF UROLIFT;  Surgeon: Geraline Knapp, MD;  Location: [facility];  Service: Urology;  Laterality: N/A;   DG ANGIO AV SHUNT*L*     right and left upper arms   FASCIOTOMY  03/03/2012   Procedure: FASCIOTOMY;  Surgeon: Kemp Patter, MD;  Location: [facility];  Service: Orthopedics;  Laterality: Right;  FASCIOTOMY RIGHT SMALL FINGER   FASCIOTOMY Left 08/17/2013   Procedure: FASCIOTOMY LEFT RING;  Surgeon: Kemp Patter, MD;  Location: [facility];  Service: Orthopedics;  Laterality: Left;   INCISION AND DRAINAGE ABSCESS Left 10/15/2015   Procedure: INCISION AND DRAINAGE ABSCESS;  Surgeon: Alben Alma, MD;  Location: [facility];  Service: General;  Laterality: Left;   KIDNEY TRANSPLANT  09/13/2010   cadaver--at [facility]   LASER ABLATION CONDOLAMATA N/A 01/08/2023   Procedure: LASER REMOVAL ABLATION OF CONDYLOMATA;  Surgeon: Candyce Champagne, MD;  Location: [facility];  Service: General;  Laterality: N/A;  GEN AND LOCAL   RECTAL EXAM UNDER ANESTHESIA N/A 01/08/2023   Procedure: ANORECTAL EXAM UNDER ANESTHESIA;  Surgeon: Candyce Champagne, MD;  Location: [facility];  Service: General;  Laterality: N/A;   RIGHT HEART CATH N/A 11/15/2016   Procedure: RIGHT HEART CATH;  Surgeon: Wenona Hamilton, MD;  Location: [facility]  INVASIVE CV LAB;  Service: Cardiovascular;  Laterality: N/A;   RIGHT HEART CATH N/A 05/03/2019   Procedure: RIGHT HEART CATH;  Surgeon: Darlis Eisenmenger, MD;  Location: [facility] INVAS[facility] CV [facility];  Service: Cardiovascular;  Laterality: N/A;   RIGHT HEART CATH N/A 04/04/2022   Procedure: RIGHT HEART CATH;  Surgeon: Darlis Eisenmenger, MD;  Location: [facility] INVASIVE CV LAB;  Service: Cardiovascular;  Laterality: N/A;   ROUX-EN-Y GASTRIC BYPASS N/A 2015   TYMPANIC MEMBRANE REPAIR Left 03/2010   VASECTOMY     WART FULGURATION N/A 05/31/2022   Procedure: FULGURATION ANAL WART;  Surgeon: Conrado Delay, DO;  Location: [facility];  Service: General;  Laterality: N/A;   Current Outpatient Medications  Medication Sig Dispense Refill   albuterol  (PROVENTIL ) (2.5 MG/3ML) 0.083% nebulizer solution Take 3 mLs (2.5 mg total) by nebulization every 6 (six)  hours as needed for wheezing or shortness of breath. 150 mL 0   albuterol  (VENTOLIN  HFA) 108 (90 Base) MCG/ACT inhaler Inhale 2 puffs into the lungs every 6 (six) hours as needed for wheezing or shortness of breath. 6.7 g 5   apixaban  (ELIQUIS ) 5 MG TABS tablet Take 1 tablet (5 mg total) by mouth 2 (two) times daily. 180 tablet 3   azithromycin  (ZITHROMAX ) 500 MG tablet Take 1 tablet (500 mg total) by mouth the night before surgery OR 1 hour prior to surgery. 3 tablet 0   benzonatate  (TESSALON ) 200 MG capsule Take 1 capsule (200 mg total) by mouth 3 (three) times daily as needed for cough. 60 capsule 0   buPROPion  (WELLBUTRIN  SR) 150 MG 12 hr tablet Take 1 tablet (150 mg total) by mouth 2 (two) times daily. 180 tablet 3   colchicine  0.6 MG tablet Take 1 tablet (0.6 mg total) by mouth every other day. 45 tablet 1   Continuous Glucose Sensor (FREESTYLE LIBRE 3 SENSOR) MISC Use one every 2 weeks. 6 each 3   Cyanocobalamin  (B-12 PO) Take 1 tablet by mouth daily.     dapagliflozin  propanediol (FARXIGA ) 10 MG TABS tablet Take 1 tablet (10 mg total) by mouth daily before breakfast. 90  tablet 3   febuxostat  (ULORIC ) 40 MG tablet Take 3 tablets (120 mg total) by mouth daily. 90 tablet 5   fluticasone  (FLONASE ) 50 MCG/ACT nasal spray Place 2 sprays into both nostrils daily. 16 g 11   gabapentin  (NEURONTIN ) 300 MG capsule Take 1 capsule (300 mg total) by mouth 2 (two) times daily. 180 capsule 3   insulin  glargine-yfgn (SEMGLEE ) 100 UNIT/ML Pen Inject 20 Units into the skin daily. 45 mL 3   Insulin  Pen Needle (TECHLITE PEN NEEDLES) 31G X 5 MM MISC 4 (four) times daily. 400 each 3   Insulin  Pen Needle (TECHLITE PEN NEEDLES) 32G X 6 MM MISC Use 4 times a day 100 each 3   Insulin  Pen Needle (UNIFINE PENTIPS) 31G X 5 MM MISC Use 4 times daily as directed 400 each 3   losartan  (COZAAR ) 25 MG tablet Take 1 tablet (25 mg total) by mouth daily. 90 tablet 3   metolazone  (ZAROXOLYN ) 2.5 MG tablet Take 1 tablet (2.5 mg total) by mouth as directed. By the heart failure clinic 3 tablet 0   montelukast  (SINGULAIR ) 10 MG tablet Take 1 tablet (10 mg total) by mouth at bedtime. 30 tablet 11   mycophenolate  (MYFORTIC ) 180 MG EC tablet Take 2 tablets (360 mg total) by mouth every morning AND 1 tablet (180 mg total) every evening. 360 tablet 3   ofloxacin  (OCUFLOX ) 0.3 % ophthalmic solution Instill 1 drop into both eyes three times a day 5 mL 0   omeprazole  (PRILOSEC) 20 MG capsule Take 1 capsule (20 mg total) by mouth daily. 30 capsule 11   potassium chloride  SA (KLOR-CON  M) 20 MEQ tablet Take 1 tablet (20 mEq total) by mouth daily as needed. 90 tablet 3   predniSONE  (DELTASONE ) 5 MG tablet Take 1 tablet (5 mg total) by mouth daily. 90 tablet 3   rosuvastatin  (CRESTOR ) 10 MG tablet Take 1 tablet (10 mg total) by mouth daily. 90 tablet 3   sulfamethoxazole -trimethoprim  (BACTRIM ) 400-80 MG tablet Take 1 tablet by mouth 3 (three) times a week. 36 tablet 3   tacrolimus  (PROGRAF ) 1 MG capsule Take 2 capsules (2 mg total) by mouth every morning AND 1 capsule (1 mg total) every evening. 90  capsule 11    tadalafil  (CIALIS ) 5 MG tablet Take 1 tablet (5 mg total) by mouth daily as needed for erectile dysfunction. 90 tablet 0   tadalafil , PAH, (ADCIRCA ) 20 MG tablet Take 0.5 tablets (10 mg total) by mouth daily. 45 tablet 0   tamsulosin  (FLOMAX ) 0.4 MG CAPS capsule Take 1 capsule (0.4 mg total) by mouth daily. 30 capsule 11   Testosterone  20.25 MG/ACT (1.62%) GEL Place 2 Pump onto the skin daily. 150 g 3   tirzepatide  (MOUNJARO ) 5 MG/0.5ML Pen Inject 5 mg into the skin once a week. 2 mL 5   tiZANidine  (ZANAFLEX ) 2 MG tablet Take 1 tablet (2 mg total) by mouth every 6 (six) hours as needed for muscle spasms. 30 tablet 1   torsemide  (DEMADEX ) 20 MG tablet Take 4 tablets (80 mg total) by mouth 2 (two) times daily. 240 tablet 3   traMADol  (ULTRAM ) 50 MG tablet Take 1 tablet (50 mg total) by mouth every 6 (six) hours as needed for pain. 20 tablet 0   Zinc  50 MG TABS Take 1 tablet (50 mg total) by mouth daily. 100 tablet 3   No current facility-administered medications for this visit.   Allergies:   Contrast media [iodinated contrast media], Iodine, Nsaids, and Ibuprofen   Social History:  The patient  reports that he quit smoking about 28 years ago. His smoking use included cigars. He has never been exposed to tobacco smoke. He has never used smokeless tobacco. He reports that he does not currently use alcohol. He reports that he does not use drugs.   Family History:  The patient's family history includes Breast cancer in his maternal grandmother; Diabetes in his maternal grandmother; Heart disease in his father; Kidney disease in his father; Kidney failure in his father; Liver cancer in his paternal grandmother and paternal uncle; Valvular heart disease in his mother.   ROS:  Please see the history of present illness.   All other systems are personally reviewed and negative.   Wt Readings from Last 3 Encounters:  08/21/23 245 lb (111.1 kg)  08/15/23 248 lb (112.5 kg)  07/29/23 248 lb (112.5 kg)     BP 128/63 (BP Location: Right Arm, Patient Position: Sitting, Cuff Size: Normal)   Pulse (!) 50   Ht 5\' 10"  (1.778 m)   Wt 245 lb (111.1 kg)   SpO2 98%   BMI 35.15 kg/m  General: NAD Neck: No JVD, no thyromegaly or thyroid  nodule.  Lungs: Clear to auscultation bilaterally with normal respiratory effort. CV: Nondisplaced PMI.  Heart irregular S1/S2, no S3/S4, no murmur.  No peripheral edema.  No carotid bruit.  Normal pedal pulses.  Abdomen: Soft, nontender, no hepatosplenomegaly, no distention.  Skin: Intact without lesions or rashes.  Neurologic: Alert and oriented x 3.  Psych: Normal affect. Extremities: No clubbing or cyanosis.  HEENT: Normal.   Assessment & Plan: 1. Chronic heart failure with Mid Range EF/ With prominent RV failure/pulmonary hypertension:  Ischemic cardiomyopathy.  ?If RV failure is related to severe OSA, he was a remote smoker. Echo in 2/21 with EF 45-50%, moderately dilated RV with moderately decreased systolic function, moderate pulmonary hypertension. Echo in 12/24 showed EF 45-50%, low normal RV function, mild MR. Management complicated by CKD stage 4.  NYHA class II, not volume overloaded by exam or REDS clip today.     - Continue torsemide  80 mg bid.  BMET/BNP today.  - Continue losartan  25 mg daily.  - Will not start  spironolactone  with CKD stage IV.  - Continue Farxiga  10 mg daily. Will need to watch closely for further UTIs.   2. Atrial fibrillation: Chronic. Rate controlled.  - Continue Eliquis .  - Not on beta blocker with bradycardia.   3. CAD: S/p CABG 2005.  Cardiolite 12/22 with prior MI, no ischemia.  No chest pain. High threshold for cath given CKD.  - No ASA given Eliquis  use.  - Continue Crestor  10 mg daily. Good lipids in 1/25.  4. CKD Stage 4: S/p renal transplantation in 2012. [facility]. He remains on mycophenolate  and tacrolimus .   - BMET today.  5. OSA: Continue Bipap every night.  6. HTN: BP generally  controlled.  - Continue losartan  7. DM 2: On insulin . He follows with Endocrinology. - Continue dapagliflozin  for now but watch for further GU symptoms. - He is on Mounjaro   8. Pulmonary hypertension: Primarily pulmonary venous hypertension by RHC in 1/24 though there may be group 3 PH component from OSA.  9. Chronic Anemia: Gets iron  infusions.  10. IgG monoclonal protein: Needs followup with his hematologist.  11. Bradycardia: Suspect tachy-brady syndrome. HR generally in 50s.  Zio monitor in 12/24 did not show worrisome bradyarrhythmias (avg HR in 50s, no long pauses).   - No nodal blockers.  - May need pacemaker in future.   I spent 22 minutes reviewing records, interviewing/examining patient, and managing orders.    Followup 3 months   Peder Bourdon 08/21/2023

## 2023-08-21 NOTE — Progress Notes (Signed)
 Declined post-observation. Aware of risks. Vitals stable at discharge.

## 2023-08-21 NOTE — Patient Instructions (Signed)

## 2023-08-21 NOTE — Patient Instructions (Signed)
 Medication Changes:  START Tadalafil  10mg  (1/2 tab) as needed  Lab Work:  Go DOWN to LOWER LEVEL (LL) to have your blood work completed inside of [employer] office.   We will only call you if the results are abnormal or if the provider would like to make medication changes.    Follow-Up in: Please follow up with the Advanced [facility] Failure Clinic in 3 months with Dr. Mitzie Anda. We do not currently have that schedule. Please give us  a call in July in order to schedule your appointment for August.  At the [facility] Clinic, you and your health needs are our priority. We have a designated team specialized in the treatment of Heart Failure. This Care Team includes your primary Heart Failure Specialized Cardiologist (physician), Advanced Practice Providers (APPs- Physician Assistants and Nurse Practitioners), and Pharmacist who all work together to provide you with the care you need, when you need it.   You may see any of the following providers on your designated Care Team at your next follow up:  Dr. Jules Oar Dr. Peder Bourdon Dr. Alwin Baars Dr. Judyth Nunnery Shawnee Dellen, FNP Bevely Brush, RPH-CPP  Please be sure to bring in all your medications bottles to every appointment.   Need to Contact Us :  If you have any questions or concerns before your next appointment please send us  a message through [place] or call our office at [phone].    TO LEAVE A MESSAGE FOR THE NURSE SELECT OPTION 2, PLEASE LEAVE A MESSAGE INCLUDING: YOUR NAME DATE OF BIRTH CALL BACK NUMBER REASON FOR CALL**this is important as we prioritize the call backs  YOU WILL RECEIVE A CALL BACK THE SAME DAY AS LONG AS YOU CALL BEFORE 4:00 PM

## 2023-08-21 NOTE — Progress Notes (Signed)
 ReDS Vest / Clip - 08/21/23 0917       ReDS Vest / Clip   Station Marker D    Ruler Value 37    ReDS Value Range Low volume    ReDS Actual Value 34

## 2023-08-22 ENCOUNTER — Other Ambulatory Visit: Payer: Self-pay

## 2023-08-22 MED ORDER — OXYCODONE HCL 5 MG PO TABS
5.0000 mg | ORAL_TABLET | ORAL | 0 refills | Status: DC | PRN
  Filled 2023-08-22: qty 12, 2d supply, fill #0

## 2023-08-26 NOTE — Progress Notes (Signed)
 COVID Vaccine received:  []  No []  Yes Date of any COVID positive Test in last 90 days:  PCP - Curt Dover MD Cardiologist - Aldine Amber MD Heart failure clinic- Peder Bourdon MD  Chest x-ray - 04/08/23 [facility] EKG -  08/21/23 [facility] Stress Test - 03/09/21 [facility] ECHO - 03/25/23 [facility] Cardiac Cath - 04/04/22 Epic  Bowel Prep - []  No  []   Yes ______  Pacemaker / ICD device []  No []  Yes   Spinal Cord Stimulator:[]  No []  Yes       History of Sleep Apnea? []  No []  Yes   CPAP used?- []  No []  Yes    Does the patient monitor blood sugar?          []  No []  Yes  []  N/A  Patient has: []  NO Hx DM   []  Pre-DM                 []  DM1  []   DM2 Does patient have a [employer] or Dexacom? []  No []  Yes   Fasting Blood Sugar Ranges-  Checks Blood Sugar _____ times a day  GLP1 agonist / usual dose - Mounjaro  every Sunday GLP1 instructions:  SGLT-2 inhibitors / usual dose -  SGLT-2 instructions:   Blood Thinner / Instructions: Aspirin  Instructions:  Comments:   Activity level: Patient is able / unable to climb a flight of stairs without difficulty; []  No CP  []  No SOB, but would have ___   Patient can / can not perform ADLs without assistance.   Anesthesia review:   Patient denies shortness of breath, fever, cough and chest pain at PAT appointment.  Patient verbalized understanding and agreement to the Pre-Surgical Instructions that were given to them at this PAT appointment. Patient was also educated of the need to review these PAT instructions again prior to his/her surgery.I reviewed the appropriate phone numbers to call if they have any and questions or concerns.

## 2023-08-26 NOTE — Patient Instructions (Signed)
 SURGICAL WAITING ROOM VISITATION  Patients having surgery or a procedure may have no more than 2 support people in the waiting area - these visitors may rotate.    Children under the age of 17 must have an adult with them who is not the patient.  Due to an increase in RSV and influenza rates and associated hospitalizations, children ages 11 and under may not visit patients in [facility] hospitals.  Visitors with respiratory illnesses are discouraged from visiting and should remain at home.  If the patient needs to stay at the hospital during part of their recovery, the visitor guidelines for inpatient rooms apply. Pre-op nurse will coordinate an appropriate time for 1 support person to accompany patient in pre-op.  This support person may not rotate.    Please refer to the [facility] website for the visitor guidelines for Inpatients (after your surgery is over and you are in a regular room).       Your procedure is scheduled on: 09/11/23   Report to [facility] Main Entrance    Report to admitting at 6:15 AM   Call this number if you have problems the morning of surgery [phone]   Do not eat food :After Midnight.   After Midnight you may have the following liquids until 5:30 AM DAY OF SURGERY  Water  Non-Citrus Juices (without pulp, NO RED-Apple, White grape, White cranberry) Black Coffee (NO MILK/CREAM OR CREAMERS, sugar ok)  Clear Tea (NO MILK/CREAM OR CREAMERS, sugar ok) regular and decaf                             Plain Jell-O (NO RED)                                           Fruit ices (not with fruit pulp, NO RED)                                     Popsicles (NO RED)                                                               Sports drinks like Gatorade (NO RED)                   FOLLOW BOWEL PREP AND ANY ADDITIONAL PRE OP INSTRUCTIONS YOU RECEIVED FROM YOUR SURGEON'S OFFICE!!!     Oral Hygiene is also important to reduce your risk of infection.                                     Remember - BRUSH YOUR TEETH THE MORNING OF SURGERY WITH YOUR REGULAR TOOTHPASTE  DENTURES WILL BE REMOVED PRIOR TO SURGERY PLEASE DO NOT APPLY "Poly grip" OR ADHESIVES!!!   Stop all vitamins and herbal supplements 7 days before surgery.   Take these medicines the morning of surgery with A SIP OF WATER : wellbutrin , colchicine , uloric , gabapentin , metolazone (zaroxolyn ), singulair , myfortic , omeprazole , prednisone , rosuvastatin , tamsulosin .  Do not take losartan  the morning of surgery.               Stop Eliquis  2 days prior to surgery. Last dose to be 09/08/23 AM. Do not take PM dose.  DO NOT TAKE ANY ORAL DIABETIC MEDICATIONS DAY OF YOUR SURGERY Hold Farxiga  for 24 hours prior to surgery. Last dose to be 09/07/23  Bring CPAP mask and tubing day of surgery.                              You may not have any metal on your body including hair pins, jewelry, and body piercing             Do not wear make-up, lotions, powders, perfumes/cologne, or deodorant              Men may shave face and neck.   Do not bring valuables to the hospital. [facility] IS NOT             RESPONSIBLE   FOR VALUABLES.   Contacts, glasses, dentures or bridgework may not be worn into surgery.  DO NOT BRING YOUR HOME MEDICATIONS TO THE HOSPITAL. PHARMACY WILL DISPENSE MEDICATIONS LISTED ON YOUR MEDICATION LIST TO YOU DURING YOUR ADMISSION IN THE HOSPITAL!    Patients discharged on the day of surgery will not be allowed to drive home.  Someone NEEDS to stay with you for the first 24 hours after anesthesia.   Special Instructions: Bring a copy of your healthcare power of attorney and living will documents the day of surgery if you haven't scanned them before.              Please read over the following fact sheets you were given: IF YOU HAVE QUESTIONS ABOUT YOUR PRE-OP INSTRUCTIONS PLEASE CALL [phone] Ammon Bales   If you received a COVID test during your pre-op visit  it  is requested that you wear a mask when out in public, stay away from anyone that may not be feeling well and notify your surgeon if you develop symptoms. If you test positive for Covid or have been in contact with anyone that has tested positive in the last 10 days please notify you surgeon.    [facility] - Preparing for Surgery Before surgery, you can play an important role.  Because skin is not sterile, your skin needs to be as free of germs as possible.  You can reduce the number of germs on your skin by washing with CHG (chlorahexidine gluconate) soap before surgery.  CHG is an antiseptic cleaner which kills germs and bonds with the skin to continue killing germs even after washing. Please DO NOT use if you have an allergy to CHG or antibacterial soaps.  If your skin becomes reddened/irritated stop using the CHG and inform your nurse when you arrive at Short Stay. Do not shave (including legs and underarms) for at least 48 hours prior to the first CHG shower.  You may shave your face/neck.  Please follow these instructions carefully:  1.  Shower with CHG Soap the night before surgery and the  morning of surgery.  2.  If you choose to wash your hair, wash your hair first as usual with your normal  shampoo.  3.  After you shampoo, rinse your hair and body thoroughly to remove the shampoo.  4.  Use CHG as you would any other liquid soap.  You can apply chg directly to the skin and wash.  Gently with a scrungie or clean washcloth.  5.  Apply the CHG Soap to your body ONLY FROM THE NECK DOWN.   Do   not use on face/ open                           Wound or open sores. Avoid contact with eyes, ears mouth and   genitals (private parts).                       Wash face,  Genitals (private parts) with your normal soap.             6.  Wash thoroughly, paying special attention to the area where your    surgery  will be performed.  7.  Thoroughly rinse your body with warm water   from the neck down.  8.  DO NOT shower/wash with your normal soap after using and rinsing off the CHG Soap.                9.  Pat yourself dry with a clean towel.            10.  Wear clean pajamas.            11.  Place clean sheets on your bed the night of your first shower and do not  sleep with pets. Day of Surgery : Do not apply any lotions/deodorants the morning of surgery.  Please wear clean clothes to the hospital/surgery center.  FAILURE TO FOLLOW THESE INSTRUCTIONS MAY RESULT IN THE CANCELLATION OF YOUR SURGERY  PATIENT SIGNATURE_________________________________  NURSE SIGNATURE__________________________________  ________________________________________________________________________How to Manage Your Diabetes Before and After Surgery  Why is it important to control my blood sugar before and after surgery? Improving blood sugar levels before and after surgery helps healing and can limit problems. A way of improving blood sugar control is eating a healthy diet by:  Eating less sugar and carbohydrates  Increasing activity/exercise  Talking with your doctor about reaching your blood sugar goals High blood sugars (greater than 180 mg/dL) can raise your risk of infections and slow your recovery, so you will need to focus on controlling your diabetes during the weeks before surgery. Make sure that the doctor who takes care of your diabetes knows about your planned surgery including the date and location.  How do I manage my blood sugar before surgery? Check your blood sugar at least 4 times a day, starting 2 days before surgery, to make sure that the level is not too high or low. Check your blood sugar the morning of your surgery when you wake up and every 2 hours until you get to the Short Stay unit. If your blood sugar is less than 70 mg/dL, you will need to treat for low blood sugar: Do not take insulin . Treat a low blood sugar (less than 70 mg/dL) with  cup of clear juice  (cranberry or apple), 4 glucose tablets, OR glucose gel. Recheck blood sugar in 15 minutes after treatment (to make sure it is greater than 70 mg/dL). If your blood sugar is not greater than 70 mg/dL on recheck, call [phone] for further instructions. Report your blood sugar to the short stay nurse when you get to Short Stay.  If you are admitted to the hospital after surgery:  Your blood sugar will be checked by the staff and you will probably be given insulin  after surgery (instead of oral diabetes medicines) to make sure you have good blood sugar levels. The goal for blood sugar control after surgery is 80-180 mg/dL.   WHAT DO I DO ABOUT MY DIABETES MEDICATION?  Do not take oral diabetes medicines (pills) the morning of surgery.   THE MORNING OF SURGERY, take  only half of regular dose of Lantus (glargine) insulin   DO NOT TAKE THE FOLLOWING 7 DAYS PRIOR TO SURGERY: Ozempic, Wegovy, Rybelsus (Semaglutide), Byetta (exenatide), Bydureon (exenatide ER), Victoza, Saxenda (liraglutide), or Trulicity (dulaglutide) Mounjaro  (Tirzepatide ) Adlyxin (Lixisenatide), Polyethylene Glycol Loxenatide.  Patient Signature:  Date:   Nurse Signature:  Date:

## 2023-08-27 ENCOUNTER — Other Ambulatory Visit: Payer: Self-pay

## 2023-08-27 ENCOUNTER — Telehealth (HOSPITAL_COMMUNITY): Payer: Self-pay | Admitting: *Deleted

## 2023-08-27 MED ORDER — OXYCODONE HCL 5 MG PO TABS
5.0000 mg | ORAL_TABLET | ORAL | 0 refills | Status: DC | PRN
  Filled 2023-08-27: qty 8, 2d supply, fill #0

## 2023-08-27 NOTE — Telephone Encounter (Signed)
 Dr Jac Martes left VM on triage line stating he removed 4 teeth from pt on Fri 5/30, all sites except 1 look good, however 1 site is still bleeding, he request clearance for pt to hold Eliquis  for 3 days to allow bleeding to stop.  Message sent to providers

## 2023-08-27 NOTE — Telephone Encounter (Signed)
 Spoke w/pt, he is aware, agreeable, and verbalized understanding.  Spoke w/Dr Mohorn he is aware ok to hold and that we notified pt, he is appreciative for call back

## 2023-08-28 ENCOUNTER — Encounter (HOSPITAL_COMMUNITY)
Admission: RE | Admit: 2023-08-28 | Discharge: 2023-08-28 | Disposition: A | Discharge status: Home / Self Care | Source: Ambulatory Visit | Attending: Anesthesiology | Admitting: Anesthesiology

## 2023-08-28 DIAGNOSIS — I10 Essential (primary) hypertension: Secondary | ICD-10-CM

## 2023-08-28 DIAGNOSIS — E1165 Type 2 diabetes mellitus with hyperglycemia: Secondary | ICD-10-CM

## 2023-08-28 DIAGNOSIS — Z01818 Encounter for other preprocedural examination: Secondary | ICD-10-CM

## 2023-08-28 NOTE — Telephone Encounter (Signed)
 Dr Jac Martes Called to report message received regarding eliquis , would like to know if additional hold days can be added. Is patient on the correct dose/should he decrease once he restarts with bleeding event.  Pt seen in office 6/5 after overnight bleeding incident  Reports 3 of 4 site were actively bleeding/new blood present Tissue is friable and will not hold a stitch  -if another bleeding events occurs reports he will not have many options left for pt

## 2023-08-28 NOTE — Telephone Encounter (Signed)
 Dr Jac Martes aware Pt aware voiced understanding

## 2023-08-30 LAB — LIPID PANEL
Chol/HDL Ratio: 1.9 ratio (ref 0.0–5.0)
Cholesterol, Total: 72 mg/dL — ABNORMAL LOW (ref 100–199)
HDL: 37 mg/dL — ABNORMAL LOW (ref >39)
LDL Chol Calc (NIH): 18 mg/dL (ref 0–99)
Triglycerides: 83 mg/dL (ref 0–149)
VLDL Cholesterol Cal: 17 mg/dL (ref 5–40)

## 2023-08-30 LAB — VITAMIN D 1,25 DIHYDROXY
Vitamin D 1, 25 (OH)2 Total: 47 pg/mL
Vitamin D2 1, 25 (OH)2: <10 pg/mL
Vitamin D3 1, 25 (OH)2: 45 pg/mL

## 2023-08-31 ENCOUNTER — Ambulatory Visit (HOSPITAL_COMMUNITY): Payer: Self-pay | Admitting: Cardiology

## 2023-09-05 DIAGNOSIS — M25531 Pain in right wrist: Secondary | ICD-10-CM | POA: Diagnosis not present

## 2023-09-05 DIAGNOSIS — S300XXA Contusion of lower back and pelvis, initial encounter: Secondary | ICD-10-CM | POA: Diagnosis not present

## 2023-09-05 DIAGNOSIS — S42212A Unspecified displaced fracture of surgical neck of left humerus, initial encounter for closed fracture: Secondary | ICD-10-CM | POA: Diagnosis not present

## 2023-09-05 DIAGNOSIS — S4980XA Other specified injuries of shoulder and upper arm, unspecified arm, initial encounter: Secondary | ICD-10-CM | POA: Diagnosis not present

## 2023-09-05 DIAGNOSIS — M25519 Pain in unspecified shoulder: Secondary | ICD-10-CM | POA: Diagnosis not present

## 2023-09-05 DIAGNOSIS — S42202A Unspecified fracture of upper end of left humerus, initial encounter for closed fracture: Secondary | ICD-10-CM | POA: Diagnosis not present

## 2023-09-05 DIAGNOSIS — E119 Type 2 diabetes mellitus without complications: Secondary | ICD-10-CM | POA: Diagnosis not present

## 2023-09-05 DIAGNOSIS — S6991XA Unspecified injury of right wrist, hand and finger(s), initial encounter: Secondary | ICD-10-CM | POA: Diagnosis not present

## 2023-09-05 DIAGNOSIS — I959 Hypotension, unspecified: Secondary | ICD-10-CM | POA: Diagnosis not present

## 2023-09-05 DIAGNOSIS — R000 Tachycardia, unspecified: Secondary | ICD-10-CM | POA: Diagnosis not present

## 2023-09-05 DIAGNOSIS — W19XXXA Unspecified fall, initial encounter: Secondary | ICD-10-CM | POA: Diagnosis not present

## 2023-09-05 DIAGNOSIS — Z940 Kidney transplant status: Secondary | ICD-10-CM | POA: Diagnosis not present

## 2023-09-05 DIAGNOSIS — Z951 Presence of aortocoronary bypass graft: Secondary | ICD-10-CM | POA: Diagnosis not present

## 2023-09-07 NOTE — Patient Instructions (Addendum)
 SURGICAL WAITING ROOM VISITATION Patients having surgery or a procedure may have no more than 2 support people in the waiting area - these visitors may rotate in the visitor waiting room.   If the patient needs to stay at the hospital during part of their recovery, the visitor guidelines for inpatient rooms apply.  PRE-OP VISITATION  Pre-op nurse will coordinate an appropriate time for 1 support person to accompany the patient in pre-op.  This support person may not rotate.  This visitor will be contacted when the time is appropriate for the visitor to come back in the pre-op area.  Please refer to the [facility] website for the visitor guidelines for Inpatients (after your surgery is over and you are in a regular room).  You are not required to quarantine at this time prior to your surgery. However, you must do this: Hand Hygiene often Do NOT share personal items Notify your provider if you are in close contact with someone who has COVID or you develop fever 100.4 or greater, new onset of sneezing, cough, sore throat, shortness of breath or body aches.  If you test positive for Covid or have been in contact with anyone that has tested positive in the last 10 days please notify you surgeon.    Your procedure is scheduled on:  Thursday  September 11, 2023  Report to [facility] Main Entrance: [address] entrance where the [employer] is available.   Report to admitting at: 06:15 AM  Call this number if you have any questions or problems the morning of surgery [phone]  FOLLOW ANY ADDITIONAL PRE OP INSTRUCTIONS YOU RECEIVED FROM YOUR SURGEON'S OFFICE!!!  DAY PRIOR TO SURGERY:  Obtain a bottle of MIRALAX at a grocery store pharmacy  Switch to drinking liquids or pureed foods only Drink plenty of liquids all day to avoid getting dehydrated  10:00am: Take 2 doses (34g) Miralax.  2:00pm: Take 2 more doses (34g) of Miralax  Midnight: Do not eat anything solid   MORNING OF  SURGERY  Remember to not to eat anything solid that morning  Hold or take medications as recommended by the hospital staff at your Preoperative visit  Stop drinking liquids before you leave the house (>3 hours prior to surgery)  If you have questions or problems, please call [facility] [phone] to speak to someone in the clinic department at our office   Do not eat food after Midnight the night prior to your surgery/procedure.  After Midnight you may have the following liquids until  05:30 AM DAY OF SURGERY  Clear Liquid Diet Water  Black Coffee (sugar ok, NO MILK/CREAM OR CREAMERS)  Tea (sugar ok, NO MILK/CREAM OR CREAMERS) regular and decaf                             Plain Jell-O  with no fruit (NO RED)                                           Fruit ices (not with fruit pulp, NO RED)                                     Popsicles (NO RED)  Juice: NO CITRUS JUICES: only apple, WHITE grape, WHITE cranberry Sports drinks like Gatorade or Powerade (NO RED)                   Oral Hygiene is also important to reduce your risk of infection.        Remember - BRUSH YOUR TEETH THE MORNING OF SURGERY WITH YOUR REGULAR TOOTHPASTE  Do NOT smoke after Midnight the night before surgery.  How to Manage Your Diabetes Before and After Surgery  Why is it important to control my blood sugar before and after surgery? Improving blood sugar levels before and after surgery helps healing and can limit problems. A way of improving blood sugar control is eating a healthy diet by:  Eating less sugar and carbohydrates  Increasing activity/exercise  Talking with your doctor about reaching your blood sugar goals High blood sugars (greater than 180 mg/dL) can raise your risk of infections and slow your recovery, so you will need to focus on controlling your diabetes during the weeks before surgery. Make sure that the doctor  who takes care of your diabetes knows about your planned surgery including the date and location.  How do I manage my blood sugar before surgery? Check your blood sugar at least 4 times a day, starting 2 days before surgery, to make sure that the level is not too high or low. Check your blood sugar the morning of your surgery when you wake up and every 2 hours until you get to the Short Stay unit. If your blood sugar is less than 70 mg/dL, you will need to treat for low blood sugar: Do not take insulin . Treat a low blood sugar (less than 70 mg/dL) with  cup of clear juice (cranberry or apple), 4 glucose tablets, OR glucose gel. Recheck blood sugar in 15 minutes after treatment (to make sure it is greater than 70 mg/dL). If your blood sugar is not greater than 70 mg/dL on recheck, call [phone] for further instructions. Report your blood sugar to the short stay nurse when you get to Short Stay.  If you are admitted to the hospital after surgery: Your blood sugar will be checked by the staff and you will probably be given insulin  after surgery (instead of oral diabetes medicines) to make sure you have good blood sugar levels. The goal for blood sugar control after surgery is 80-180 mg/dL.   WHAT DO I DO ABOUT MY DIABETES MEDICATION?   MOUNJARO - Stop taking 7-10 days before surgery.  Last injection would have been 08-31-23.  FARXIGA -  Stop taking 72 hours before surgery. Last dose will be taken on Sunday 09-07-2023 INSULIN  GLARGINE -  DAY BEFORE SURGERY: am 100%??  50% OR 10 UNITS,                                       DAY OF SURGERY;  50% OR 10 UNITS.     IF you have any questions, call the nurse at [phone]  STOP TAKING all Vitamins, Herbs and supplements 1 week before your surgery.   Take ONLY these medicines the morning of surgery with A SIP OF WATER : Wellbutrin , Tacrolimus , Uloric , gabapentin , Myfortic , omeprazole , tamsulosin . You may use your Albuterol  inhaler and Flonase  nasal  spray. Bring your inhaler with you the day of your surgery in case you need it.   If You have been diagnosed with Sleep Apnea -  Bring CPAP mask and tubing day of surgery. We will provide you with a CPAP machine on the day of your surgery.                   You may not have any metal on your body including  jewelry, and body piercing  Do not wear make-up, lotions, powders, perfumes / cologne, or deodorant  Men may shave face and neck.  Contacts, Hearing Aids, dentures or bridgework may not be worn into surgery. DENTURES WILL BE REMOVED PRIOR TO SURGERY PLEASE DO NOT APPLY Poly grip OR ADHESIVES!!!  Patients discharged on the day of surgery will not be allowed to drive home.  Someone NEEDS to stay with you for the first 24 hours after anesthesia.  Do not bring your home medications to the hospital. The Pharmacy will dispense medications listed on your medication list to you during your admission in the Hospital.  Please read over the following fact sheets you were given: IF YOU HAVE QUESTIONS ABOUT YOUR PRE-OP INSTRUCTIONS, PLEASE CALL [phone]   [facility] - Preparing for Surgery Before surgery, you can play an important role.  Because skin is not sterile, your skin needs to be as free of germs as possible.  You can reduce the number of germs on your skin by washing with CHG (chlorahexidine gluconate) soap before surgery.  CHG is an antiseptic cleaner which kills germs and bonds with the skin to continue killing germs even after washing. Please DO NOT use if you have an allergy to CHG or antibacterial soaps.  If your skin becomes reddened/irritated stop using the CHG and inform your nurse when you arrive at Short Stay. Do not shave (including legs and underarms) for at least 48 hours prior to the first CHG shower.  You may shave your face/neck.  Please follow these instructions carefully:  1.  Shower with CHG Soap the night before surgery and the  morning of surgery.  2.  If you  choose to wash your hair, wash your hair first as usual with your normal  shampoo.  3.  After you shampoo, rinse your hair and body thoroughly to remove the shampoo.                             4.  Use CHG as you would any other liquid soap.  You can apply chg directly to the skin and wash.  Gently with a scrungie or clean washcloth.  5.  Apply the CHG Soap to your body ONLY FROM THE NECK DOWN.   Do not use on face/ open                           Wound or open sores. Avoid contact with eyes, ears mouth and genitals (private parts).                       Wash face,  Genitals (private parts) with your normal soap.             6.  Wash thoroughly, paying special attention to the area where your  surgery  will be performed.  7.  Thoroughly rinse your body with warm water  from the neck down.  8.  DO NOT shower/wash with your normal soap after using and rinsing off the CHG Soap.  9.  Pat yourself dry with a clean towel.            10.  Wear clean pajamas.            11.  Place clean sheets on your bed the night of your first shower and do not  sleep with pets.  ON THE DAY OF SURGERY : Do not apply any lotions/deodorants the morning of surgery.  Please wear clean clothes to the hospital/surgery center.     FAILURE TO FOLLOW THESE INSTRUCTIONS MAY RESULT IN THE CANCELLATION OF YOUR SURGERY  PATIENT SIGNATURE_________________________________  NURSE SIGNATURE__________________________________  ________________________________________________________________________

## 2023-09-07 NOTE — Progress Notes (Signed)
 COVID Vaccine received:  []  No [x]  Yes Date of any COVID positive Test in last 90 days:   PCP - Curt Dover, MD [phone] (Fax)  Cardiologist - Aldine Amber, MD  HF clinic- Peder Bourdon, MD  Alwin Baars, DO cardiac clearance in 07-29-23 note Nephrology- Alayne Allis. MD at [facility]. [phone] (Work)  [phone] (Fax)    Chest x-ray - 04-08-2023   2v   Epic EKG -  08-21-23  Epic Stress Test - 03-09-2021  Epic ECHO - 03-25-2023  Epic Cardiac Cath - 04-04-2022   RHC by Dr. Mitzie Anda CT Coronary Calcium  score:    Bowel Prep - []  No  [x]   Yes _Miralax_____   Pacemaker / ICD device [x]  No []  Yes   Spinal Cord Stimulator:[x]  No []  Yes       History of Sleep Apnea? []  No [x]  Yes   CPAP used?- []  No [x]  Yes     Does the patient monitor blood sugar?   []  N/A   []  No [x]  Yes  Patient has: []  NO Hx DM   []  Pre-DM   []  DM1  [x]   DM2 Last A1c was:  6.5 on   07-01-23   Does patient have a [employer] or Dexcom? []  No [x]  Yes   Fasting Blood Sugar Ranges-  Checks Blood Sugar _continuous times a day   Blood Thinner / Instructions: Eliquis   Hold x 48 hours  ok w/ cardiology Aspirin  Instructions:   ERAS Protocol Ordered: []  No  [x]  Yes PRE-SURGERY []  ENSURE  []  G2   [x]  No Drink Ordered  Patient is to be NPO after: 0530   Dental hx: []  Dentures:  []  N/A      []  Bridge or Partial:                   []  Loose or Damaged teeth:    Comments:   LEFT ARM RESTRICTION d/t AV fistulas   Activity level: Able to walk up 2 flights of stairs without becoming significantly short of breath or having chest pain?  []  No   []    Yes    Anesthesia review:  A.fib, PAH, CAD w/ CABG x4 (2005), HTN,OSA, Renal transplant, CHF, DM, CKD 4 after transplant,  s/p Roux-en-y gastric bypass 2015, Bell's Palsy- left face droop,    Patient denies shortness of breath, fever, cough and chest pain at PAT appointment.   Patient verbalized understanding and agreement to the Pre-Surgical  Instructions that were given to them at this PAT appointment. Patient was also educated of the need to review these PAT instructions again prior to his surgery.I reviewed the appropriate phone numbers to call if they have any and questions or concerns.

## 2023-09-07 NOTE — Progress Notes (Signed)
 ERROR

## 2023-09-08 ENCOUNTER — Other Ambulatory Visit: Payer: Self-pay

## 2023-09-08 DIAGNOSIS — S42301A Unspecified fracture of shaft of humerus, right arm, initial encounter for closed fracture: Secondary | ICD-10-CM | POA: Diagnosis not present

## 2023-09-08 DIAGNOSIS — S40011A Contusion of right shoulder, initial encounter: Secondary | ICD-10-CM | POA: Diagnosis not present

## 2023-09-08 DIAGNOSIS — S42202A Unspecified fracture of upper end of left humerus, initial encounter for closed fracture: Secondary | ICD-10-CM | POA: Diagnosis not present

## 2023-09-08 DIAGNOSIS — M62838 Other muscle spasm: Secondary | ICD-10-CM | POA: Diagnosis not present

## 2023-09-08 DIAGNOSIS — S7002XA Contusion of left hip, initial encounter: Secondary | ICD-10-CM | POA: Diagnosis not present

## 2023-09-08 MED ORDER — HYDROCODONE-ACETAMINOPHEN 5-325 MG PO TABS
1.0000 | ORAL_TABLET | ORAL | 0 refills | Status: DC | PRN
  Filled 2023-09-08: qty 30, 5d supply, fill #0

## 2023-09-09 ENCOUNTER — Encounter: Payer: Self-pay | Admitting: Internal Medicine

## 2023-09-09 ENCOUNTER — Other Ambulatory Visit: Payer: Self-pay | Admitting: Internal Medicine

## 2023-09-09 ENCOUNTER — Encounter (HOSPITAL_COMMUNITY)
Admission: RE | Admit: 2023-09-09 | Discharge: 2023-09-09 | Disposition: A | Discharge status: Home / Self Care | Source: Ambulatory Visit | Attending: Anesthesiology

## 2023-09-09 ENCOUNTER — Other Ambulatory Visit: Payer: Self-pay

## 2023-09-09 ENCOUNTER — Telehealth: Payer: Self-pay

## 2023-09-09 ENCOUNTER — Encounter: Payer: Self-pay | Admitting: Oncology

## 2023-09-09 DIAGNOSIS — J309 Allergic rhinitis, unspecified: Secondary | ICD-10-CM

## 2023-09-09 DIAGNOSIS — E1122 Type 2 diabetes mellitus with diabetic chronic kidney disease: Secondary | ICD-10-CM

## 2023-09-09 DIAGNOSIS — Z01818 Encounter for other preprocedural examination: Secondary | ICD-10-CM

## 2023-09-09 MED ORDER — FLUTICASONE PROPIONATE 50 MCG/ACT NA SUSP
2.0000 | Freq: Every day | NASAL | 11 refills | Status: DC
  Filled 2023-09-09: qty 16, 30d supply, fill #0
  Filled 2023-10-06: qty 16, 30d supply, fill #1
  Filled 2023-11-16: qty 16, 30d supply, fill #2
  Filled 2023-12-31: qty 16, 30d supply, fill #3
  Filled 2024-03-11: qty 16, 30d supply, fill #4

## 2023-09-09 MED FILL — Montelukast Sodium Tab 10 MG (Base Equiv): ORAL | 90 days supply | Qty: 90 | Fill #5 | Status: AC

## 2023-09-09 MED FILL — Omeprazole Cap Delayed Release 20 MG: ORAL | 90 days supply | Qty: 90 | Fill #5 | Status: AC

## 2023-09-09 MED FILL — Tamsulosin HCl Cap 0.4 MG: ORAL | 90 days supply | Qty: 90 | Fill #5 | Status: AC

## 2023-09-09 NOTE — Telephone Encounter (Signed)
I replied on MyChart.

## 2023-09-09 NOTE — Telephone Encounter (Signed)
 Copied from CRM [id number]. Topic: Clinical - Medical Advice >> Sep 09, 2023  8:34 AM Albertha Alosa wrote: Reason for CRM: Patient wife called in regarding patient stated patient fell Friday , he broke his shoulder and was seen for that, but she stated he has a hematoma on his butt and she wanted to know what he needs to do for that, would like a callback regarding this    [phone]

## 2023-09-10 ENCOUNTER — Ambulatory Visit: Payer: Self-pay

## 2023-09-10 ENCOUNTER — Other Ambulatory Visit: Payer: Self-pay

## 2023-09-10 MED FILL — Metolazone Tab 2.5 MG: ORAL | 30 days supply | Qty: 30 | Fill #0 | Status: AC

## 2023-09-10 NOTE — Telephone Encounter (Signed)
 There are several MyChart messages between his wife and Dr Joelle Musca about the hematoma. Left message to try and get more clarification about the message.

## 2023-09-10 NOTE — Telephone Encounter (Signed)
 FYI Only or Action Required?: FYI only for provider  Patient was last seen in primary care on 07/17/2023 by Helaine Llanos, MD. Called Nurse Triage reporting Shoulder Pain. Symptoms began several days ago. Interventions attempted: Nothing. Symptoms are: unchanged.  Triage Disposition: Go to ED or PCP/Alternative with Approval  Patient/caregiver understands and will follow disposition?: No  Copied from CRM [id number]. Topic: Clinical - Red Word Triage >> Sep 10, 2023  1:50 PM [place] E wrote: [facility] that prompted transfer to Nurse Triage: Severe pain on left shoulder, back and buttocks. Patient fell 5 days ago and rated the pain a 10 out of 10. Reason for Disposition  [1] SEVERE pain AND [2] not improved 2 hours after pain medicine  Answer Assessment - Initial Assessment Questions 1. ONSET: When did the pain start?     5 days ago 2. LOCATION: Where is the pain located?     L shoulder 3. PAIN: How bad is the pain? (Scale 1-10; or mild, moderate, severe)   - MILD (1-3): doesn't interfere with normal activities   - MODERATE (4-7): interferes with normal activities (e.g., work or school) or awakens from sleep   - SEVERE (8-10): excruciating pain, unable to do any normal activities, unable to move arm at all due to pain     10 4. WORK OR EXERCISE: Has there been any recent work or exercise that involved this part of the body?     fell 5. CAUSE: What do you think is causing the shoulder pain?     Break  Pt states that he cannot come to clinic d/t shoulder pain. Pt states that his buttocks also hurt from recent fall. RN relayed PCP message that pt needs to do face to face for PCP to order [facility]. Pt declines, states that he cannot move. Pt advised that if the pain is severe he should utilize the ED, pt declines states that would be to painful.  Protocols used: Shoulder Pain-A-AH

## 2023-09-11 ENCOUNTER — Other Ambulatory Visit: Payer: Self-pay

## 2023-09-12 ENCOUNTER — Ambulatory Visit: Admitting: Internal Medicine

## 2023-09-12 ENCOUNTER — Encounter: Payer: Self-pay | Admitting: Internal Medicine

## 2023-09-12 ENCOUNTER — Ambulatory Visit: Admitting: Oncology

## 2023-09-12 ENCOUNTER — Other Ambulatory Visit: Payer: Self-pay

## 2023-09-12 VITALS — BP 100/60 | HR 52 | Temp 98.0°F | Ht 70.0 in

## 2023-09-12 DIAGNOSIS — I5032 Chronic diastolic (congestive) heart failure: Secondary | ICD-10-CM | POA: Diagnosis not present

## 2023-09-12 DIAGNOSIS — L97921 Non-pressure chronic ulcer of unspecified part of left lower leg limited to breakdown of skin: Secondary | ICD-10-CM | POA: Diagnosis not present

## 2023-09-12 DIAGNOSIS — S4292XA Fracture of left shoulder girdle, part unspecified, initial encounter for closed fracture: Secondary | ICD-10-CM | POA: Diagnosis not present

## 2023-09-12 MED ORDER — SILVER SULFADIAZINE 1 % EX CREA
1.0000 | TOPICAL_CREAM | Freq: Every day | CUTANEOUS | 1 refills | Status: DC
  Filled 2023-09-12: qty 50, 50d supply, fill #0
  Filled 2023-10-29: qty 50, 50d supply, fill #1

## 2023-09-12 MED ORDER — HYDROCODONE-ACETAMINOPHEN 7.5-325 MG PO TABS
1.0000 | ORAL_TABLET | Freq: Four times a day (QID) | ORAL | 0 refills | Status: DC | PRN
  Filled 2023-09-12: qty 60, 15d supply, fill #0

## 2023-09-12 NOTE — Assessment & Plan Note (Signed)
 Not infected Rx silvadene  to use

## 2023-09-12 NOTE — Progress Notes (Signed)
 Subjective:    Patient ID: [name]., male    DOB: March 10, 1961, 63 y.o.   MRN: [id number]  HPI Here with wife due to fall with shoulder fracture and hematoma on buttocks  Slipped trying to get into a public bathroom Missed a step and fell onto left side Went to [facility] ER---fracture of shoulder (?humerus) Large hematoma in buttock and left flank May have also fractured small bone in right hand---but no action on this since he needs that hand Cut on lower left calf  Pain is constant Can't transfer out of chairs Can't eat---is left handed Can't do any ADLs  He is very upset ---I have never been so helpless  Current Outpatient Medications on File Prior to Visit  Medication Sig Dispense Refill   albuterol  (PROVENTIL ) (2.5 MG/3ML) 0.083% nebulizer solution Take 3 mLs (2.5 mg total) by nebulization every 6 (six) hours as needed for wheezing or shortness of breath. 150 mL 0   albuterol  (VENTOLIN  HFA) 108 (90 Base) MCG/ACT inhaler Inhale 2 puffs into the lungs every 6 (six) hours as needed for wheezing or shortness of breath. 6.7 g 5   apixaban  (ELIQUIS ) 5 MG TABS tablet Take 1 tablet (5 mg total) by mouth 2 (two) times daily. 180 tablet 3   azithromycin  (ZITHROMAX ) 500 MG tablet Take 1 tablet (500 mg total) by mouth the night before surgery OR 1 hour prior to surgery. 3 tablet 0   buPROPion  (WELLBUTRIN  SR) 150 MG 12 hr tablet Take 1 tablet (150 mg total) by mouth 2 (two) times daily. 180 tablet 3   colchicine  0.6 MG tablet Take 1 tablet (0.6 mg total) by mouth every other day. 45 tablet 1   Continuous Glucose Sensor (FREESTYLE LIBRE 3 SENSOR) MISC Use one every 2 weeks. 6 each 3   Cyanocobalamin  (B-12 PO) Take 1 tablet by mouth daily.     dapagliflozin  propanediol (FARXIGA ) 10 MG TABS tablet Take 1 tablet (10 mg total) by mouth daily before breakfast. 90 tablet 3   febuxostat  (ULORIC ) 40 MG tablet Take 3 tablets (120 mg total) by mouth daily. 90 tablet 5   fluticasone   (FLONASE ) 50 MCG/ACT nasal spray Place 2 sprays into both nostrils daily. 16 g 11   gabapentin  (NEURONTIN ) 300 MG capsule Take 1 capsule (300 mg total) by mouth 2 (two) times daily. 180 capsule 3   HYDROcodone -acetaminophen  (NORCO/VICODIN) 5-325 MG tablet Take 1 tablet by mouth every 4 (four) hours as needed for pain. 30 tablet 0   insulin  glargine-yfgn (SEMGLEE ) 100 UNIT/ML Pen Inject 20 Units into the skin daily. 45 mL 3   Insulin  Pen Needle (TECHLITE PEN NEEDLES) 31G X 5 MM MISC 4 (four) times daily. 400 each 3   Insulin  Pen Needle (TECHLITE PEN NEEDLES) 32G X 6 MM MISC Use 4 times a day 100 each 3   Insulin  Pen Needle (UNIFINE PENTIPS) 31G X 5 MM MISC Use 4 times daily as directed 400 each 3   losartan  (COZAAR ) 25 MG tablet Take 1 tablet (25 mg total) by mouth daily. 90 tablet 3   metolazone  (ZAROXOLYN ) 2.5 MG tablet Take 1 tablet (2.5 mg total) by mouth as directed. By the heart failure clinic 30 tablet 0   montelukast  (SINGULAIR ) 10 MG tablet Take 1 tablet (10 mg total) by mouth at bedtime. 30 tablet 11   mycophenolate  (MYFORTIC ) 180 MG EC tablet Take 2 tablets (360 mg total) by mouth every morning AND 1 tablet (180 mg total) every evening.  360 tablet 3   ofloxacin  (OCUFLOX ) 0.3 % ophthalmic solution Instill 1 drop into both eyes three times a day 5 mL 0   omeprazole  (PRILOSEC) 20 MG capsule Take 1 capsule (20 mg total) by mouth daily. 30 capsule 11   oxyCODONE  (OXY IR/ROXICODONE ) 5 MG immediate release tablet Take 1 tablet (5 mg) by mouth every 4-6 hours as needed for severe pain 8 tablet 0   potassium chloride  SA (KLOR-CON  M) 20 MEQ tablet Take 1 tablet (20 mEq total) by mouth daily as needed. 90 tablet 3   predniSONE  (DELTASONE ) 5 MG tablet Take 1 tablet (5 mg total) by mouth daily. 90 tablet 3   rosuvastatin  (CRESTOR ) 10 MG tablet Take 1 tablet (10 mg total) by mouth daily. 90 tablet 3   sulfamethoxazole -trimethoprim  (BACTRIM ) 400-80 MG tablet Take 1 tablet by mouth 3 (three) times a week.  36 tablet 3   tacrolimus  (PROGRAF ) 1 MG capsule Take 2 capsules (2 mg total) by mouth every morning AND 1 capsule (1 mg total) every evening. 90 capsule 11   tadalafil  (CIALIS ) 5 MG tablet Take 1 tablet (5 mg total) by mouth daily as needed for erectile dysfunction. 90 tablet 0   tadalafil , PAH, (ADCIRCA ) 20 MG tablet Take 0.5 tablets (10 mg total) by mouth daily. 45 tablet 0   tamsulosin  (FLOMAX ) 0.4 MG CAPS capsule Take 1 capsule (0.4 mg total) by mouth daily. 30 capsule 11   Testosterone  20.25 MG/ACT (1.62%) GEL Place 2 Pump onto the skin daily. 150 g 3   tirzepatide  (MOUNJARO ) 5 MG/0.5ML Pen Inject 5 mg into the skin once a week. 2 mL 5   tiZANidine  (ZANAFLEX ) 2 MG tablet Take 1 tablet (2 mg total) by mouth every 6 (six) hours as needed for muscle spasms. 30 tablet 1   torsemide  (DEMADEX ) 20 MG tablet Take 4 tablets (80 mg total) by mouth 2 (two) times daily. 240 tablet 3   traMADol  (ULTRAM ) 50 MG tablet Take 1 tablet (50 mg total) by mouth every 6 (six) hours as needed for pain. 20 tablet 0   Zinc  50 MG TABS Take 1 tablet (50 mg total) by mouth daily. 100 tablet 3   benzonatate  (TESSALON ) 200 MG capsule Take 1 capsule (200 mg total) by mouth 3 (three) times daily as needed for cough. (Patient not taking: Reported on 09/12/2023) 60 capsule 0   No current facility-administered medications on file prior to visit.    Allergies  Allergen Reactions   Contrast Media [Iodinated Contrast Media] Other (See Comments)    Kidney transplant   Iodine Other (See Comments)    Kidney transplant   Nsaids Other (See Comments)    Kidney transplant   Ibuprofen Other (See Comments)    Due to kidney transplant    Past Medical History:  Diagnosis Date   Anal condyloma    Anemia    Aortic atherosclerosis (HCC)    Aortic stenosis    a.) TTE 10/2021: Mod AS. AVA 1.06cm^2 (VTI). Mean grad ; b.) TTE 04/02/2022: no AV stenosis   Asthma, persistent controlled 02/25/2013   Attention or concentration  deficit 04/26/2021   BPH with obstruction/lower urinary tract symptoms 09/25/2014   CAD (coronary artery disease) 2005   a.) LHC 1996 -> HG stenosis mLAD -> PTCA/PCI with stent x 1 (unk type) -> complicated by CFA pseudoanurysm (required surg);  b.) LHC 08/10/2002  -> 95% LAD, 70% ISR LAD, 80% pOM, 70% mRCA --> CVTS consult; c.) 4v CABG 08/13/2002; c.)  03/2018 MV: Small, fixed inferoapical and apical sep defect. No isc. EF 47% (60-65% by 05/2018 TTE); d.) 02/2021 MV: small Apical inf fixed defect. No isc -> low risk.   Cardiomegaly    Cellulitis and abscess of left leg 07/22/2020   Chronic atrial fibrillation    a.) CHA2DS2VASc = 4 (CHF, HTN, vascular disease history, T2DM);  b.) rate/rhythm maintained without pharmacological intervention; chronically anticoagulated with apixaban    Chronic combined systolic and diastolic CHF (congestive heart failure)    a.) TTE 7/18 : EF 45-50%; b.) TTE 05/2018 : EF 60-65%, RVSP 74.8; c.) TTE 12/2018: EF 50-55%. Sev dil LA; d.) TTE 04/2019: EF 45-50%; e.) TTE 07/2021: EF 40-45%, G3DD; f.) TTE 10/2021: EF 40-45%, mild conc LVH, septal-lat dyssynchrony (LBBB), mildly red RVSF, mild-mod dil LA, mildly dil RA, mild-mod MS, mod AS; g.) TTE 04/02/2022: EF 40-45%, glob HK, LVH, red RVSF, RVE, sev LAE, mild-mod RAE, mild MR   Chronic venous stasis dermatitis of both lower extremities    Cytomegaloviral disease 2017   Diverticulosis of colon 04/22/2013   Dyspnea    Elevated RIGHT hemidiaphragm    Erectile dysfunction    a.) on topical TRT + Trimix (alprostadil/papaverine/phentolamine) injections   ESRD (end stage renal disease) (HCC)    a.) dialysis dependent 2004-2012; b.) s/p cadaveric RIGHT renal transplant 09/13/2010   Essential hypertension 12/17/2010   GERD (gastroesophageal reflux disease)    Gout    History of 2019 novel coronavirus disease (COVID-19) 06/30/2019   History of bilateral cataract extraction 10/2017   History of renal transplant 09/13/2010   a.) s/p  cadaveric donor transplant 09/13/2010   Hx of bilateral cataract extraction 10/2017   Hyperlipidemia    Hypogonadism in male 09/25/2014   Ischemic cardiomyopathy    Left cephalic vein thrombosis 06/2019   Long term current use of anticoagulant    a.) apixaban    Long term current use of immunosuppressive drug    a.) mycophenolate  + prednisone  + tacrolimus    Major depressive disorder 10/04/2013   Mitral stenosis 11/07/2021   a.) TTE 11/07/2021: severe MAC, mild-mod MS (MPG 6 mmHg); b.) TTE 04/02/2022: no MV stenosis   Murmur    Obesity hypoventilation syndrome 03/31/2012   OSA treated with BiPAP 09/29/2012   PAH (pulmonary artery hypertension)    a. 04/2019 RHC: RA 19, RV 80/20, PA 78/31 (51), PCWP 25, CO/CI 7.69/3.26. PVR 3.25 --> Sev PAH, likely primarily PV HTN; b.) RHC 04/04/2022: mRA 13, mPA 40, mPCWP 20, PA sat 59, AO sat 92, CO 7.63, CI 3.26, PVR 2.6 --> mod portal venous HTN   Pancreatitis    S/P CABG x 4 08/13/2002   a.) LIMA-LAD, LRA-OM, SVG-D1, SVG-dRCA   S/P gastric bypass    Synovitis of finger 06/11/2019   Trigger finger, unspecified little finger 12/23/2018   Type 2 diabetes mellitus with hyperglycemia, with long-term current use of insulin  09/09/2014   a.) has FreeStyle Libre CGM   Ulcer    Left shin goes to wound care center at [facility] qweek   Wears glasses    Wears hearing aid in both ears     Past Surgical History:  Procedure Laterality Date   CARDIAC CATHETERIZATION N/A 08/10/2002   CATARACT EXTRACTION W/PHACO Right 10/29/2017   Procedure: CATARACT EXTRACTION PHACO AND INTRAOCULAR LENS PLACEMENT (IOC)  RIGHT DIABETIC;  Surgeon: Annell Kidney, MD;  Location: [facility];  Service: Ophthalmology;  Laterality: Right   CATARACT EXTRACTION W/PHACO Left 11/18/2017   Procedure: CATARACT EXTRACTION PHACO  AND INTRAOCULAR LENS PLACEMENT (IOC) LEFT IVA/TOPICAL;  Surgeon: Annell Kidney, MD;  Location: [facility];  Service: Ophthalmology;   Laterality: Left   COLONOSCOPY WITH PROPOFOL  N/A 04/22/2013   Procedure: COLONOSCOPY WITH PROPOFOL ;  Surgeon: Janel Medford, MD;  Location: [facility];  Service: Endoscopy;  Laterality: N/A;   CORONARY ANGIOPLASTY WITH STENT PLACEMENT N/A 1996   CORONARY ARTERY BYPASS GRAFT N/A 08/13/2002   Procedure: 4v CORONARY ARTERY BYPASS GRAFTING; Location: [facility]; Surgeon: Ruffus Couch, MD   CYSTOSCOPY WITH INSERTION OF UROLIFT N/A 12/25/2021   Procedure: CYSTOSCOPY WITH INSERTION OF UROLIFT;  Surgeon: Geraline Knapp, MD;  Location: [facility];  Service: Urology;  Laterality: N/A;   DG ANGIO AV SHUNT*L*     right and left upper arms   FASCIOTOMY  03/03/2012   Procedure: FASCIOTOMY;  Surgeon: Kemp Patter, MD;  Location: [facility];  Service: Orthopedics;  Laterality: Right;  FASCIOTOMY RIGHT SMALL FINGER   FASCIOTOMY Left 08/17/2013   Procedure: FASCIOTOMY LEFT RING;  Surgeon: Kemp Patter, MD;  Location: [facility];  Service: Orthopedics;  Laterality: Left;   INCISION AND DRAINAGE ABSCESS Left 10/15/2015   Procedure: INCISION AND DRAINAGE ABSCESS;  Surgeon: Alben Alma, MD;  Location: [facility];  Service: General;  Laterality: Left;   KIDNEY TRANSPLANT  09/13/2010   cadaver--at [facility]   LASER ABLATION CONDOLAMATA N/A 01/08/2023   Procedure: LASER REMOVAL ABLATION OF CONDYLOMATA;  Surgeon: Candyce Champagne, MD;  Location: [facility];  Service: General;  Laterality: N/A;  GEN AND LOCAL   RECTAL EXAM UNDER ANESTHESIA N/A 01/08/2023   Procedure: ANORECTAL EXAM UNDER ANESTHESIA;  Surgeon: Candyce Champagne, MD;  Location: [facility];  Service: General;  Laterality: N/A;   RIGHT HEART CATH N/A 11/15/2016   Procedure: RIGHT HEART CATH;  Surgeon: Wenona Hamilton, MD;  Location: [facility];  Service: Cardiovascular;  Laterality: N/A;   RIGHT HEART CATH N/A 05/03/2019   Procedure: RIGHT HEART CATH;  Surgeon: Darlis Eisenmenger, MD;  Location: [facility];  Service:  Cardiovascular;  Laterality: N/A;   RIGHT HEART CATH N/A 04/04/2022   Procedure: RIGHT HEART CATH;  Surgeon: Darlis Eisenmenger, MD;  Location: [facility] IN[facility] CV LAB;  Service: Cardiovascular;  Laterality: N/A;   ROUX-EN-Y GASTRIC BYPASS N/A 2015   TYMPANIC MEMBRANE REPAIR Left 03/2010   VASECTOMY     WART FULGURATION N/A 05/31/2022   Procedure: FULGURATION ANAL WART;  Surgeon: Conrado Delay, DO;  Location: [facility];  Service: General;  Laterality: N/A;    Family History  Problem Relation Age of Onset   Heart disease Father    Kidney failure Father    Kidney disease Father    Diabetes Maternal Grandmother    Breast cancer Maternal Grandmother    Valvular heart disease Mother    Liver cancer Paternal Uncle    Liver cancer Paternal Grandmother    Prostate cancer Neg Hx     Social History   Socioeconomic History   Marital status: Married    Spouse name: Not on file   Number of children: 2   Years of education: 14   Highest education level: Associate degree: academic program  Occupational History   Occupation: Presenter, broadcasting: unemployed    Comment: disabled due to kidney failure  Tobacco Use   Smoking status: Former    Types: Cigars    Quit date: 09/02/1994    Years since quitting: 29.0  Passive exposure: Never   Smokeless tobacco: Never  Vaping Use   Vaping status: Never Used  Substance and Sexual Activity   Alcohol use: Not Currently    Comment: occ   Drug use: No   Sexual activity: Not on file  Other Topics Concern   Not on file  Social History Narrative   Has living will   Wife is health care POA---son Zoila Hines is alternate   Would accept resuscitation attempts   No tube feedings if cognitively unaware   Social Drivers of Corporate investment banker Strain: Not on file  Food Insecurity: Low Risk  (06/25/2023)   Received from [facility]   Hunger Vital Sign    Within the past 12 months, you worried that your food would run out before  you got money to buy more: Never true    Within the past 12 months, the food you bought just didn't last and you didn't have money to get more. : Never true  Transportation Needs: No Transportation Needs (06/25/2023)   Received from [facility]    In the past 12 months, has lack of reliable transportation kept you from medical appointments, meetings, work or from getting things needed for daily living? : No  Physical Activity: Not on file  Stress: Not on file  Social Connections: Socially Integrated (04/11/2023)   Social Connection and Isolation Panel    Frequency of Communication with Friends and Family: More than three times a week    Frequency of Social Gatherings with Friends and Family: More than three times a week    Attends Religious Services: More than 4 times per year    Active Member of [employer] or Organizations: Yes    Attends Engineer, structural: More than 4 times per year    Marital Status: Married  Catering manager Violence: Not At Risk (04/11/2023)   Humiliation, Afraid, Rape, and Kick questionnaire    Fear of Current or Ex-Partner: No    Emotionally Abused: No    Physically Abused: No    Sexually Abused: No   Review of Systems Eating very little Having trouble sleeping---in recliner     Objective:   Physical Exam  Musculoskeletal:     Comments: Left arm in sling Right wrist with significant tenderness ---trouble with any passive ROM   Skin:    Comments: ~3 cm ulcer along medial lower left calf---in severe stasis derm area. Borders not active Not inflamed            Assessment & Plan:

## 2023-09-12 NOTE — Addendum Note (Signed)
 Addended by: Curt Dover I on: 09/12/2023 11:23 AM   Modules accepted: Orders

## 2023-09-12 NOTE — Assessment & Plan Note (Signed)
 Seems to still be compensated No med changes

## 2023-09-12 NOTE — Addendum Note (Signed)
 Addended by: Curt Dover I on: 09/12/2023 01:39 PM   Modules accepted: Orders

## 2023-09-12 NOTE — Assessment & Plan Note (Signed)
 Has seen ortho In sling This is his dominant arm and he is completely disabled now--unable even to eat on his own. Wife has been trying to get a few hours off here and there to give him care Will make home health referral as well as [facility] referral Increase hydrocodone  to 7.5mg  per dose

## 2023-09-15 ENCOUNTER — Other Ambulatory Visit: Admitting: Urology

## 2023-09-15 ENCOUNTER — Encounter: Payer: Self-pay | Admitting: Oncology

## 2023-09-15 DIAGNOSIS — E662 Morbid (severe) obesity with alveolar hypoventilation: Secondary | ICD-10-CM | POA: Diagnosis not present

## 2023-09-15 DIAGNOSIS — G4733 Obstructive sleep apnea (adult) (pediatric): Secondary | ICD-10-CM | POA: Diagnosis not present

## 2023-09-16 ENCOUNTER — Telehealth: Payer: Self-pay

## 2023-09-16 NOTE — Progress Notes (Signed)
 Thank you for this referral. The [facility] team receives referrals for eligible patients: all patients with [facility] Primary Care Provider (any health plan including Medicaid or uninsured) and patients with a [facility] Primary Care Provider AND contracted health plan.     This patient does not have a [facility] or [facility] Primary Care Provider. [facility] cannot directly provide Care Management services to patients who do not have a qualifying Primary Care Provider. Please call us if you need further clarification at [phone].     [facility]  [facility], [facility] Health Care Management Assistant Direct Dial: [phone]  Fax: [phone]

## 2023-09-17 ENCOUNTER — Telehealth: Payer: Self-pay

## 2023-09-17 NOTE — Progress Notes (Signed)
 Complex Care Management Care Guide Note  09/17/2023 Name: [name]. MRN: [id number] DOB: [birth year]  [name]. is a 63 y.o. year old male who is a primary care patient of [name], MD and is actively engaged with the care management team. I reached out to [facility]. by phone today to assist with provider appointment scheduling  with the Pharmacist.  Follow up plan: Patient stated he will follow up with PCP on home [name] referral as referral for [facility] is not needed at this time.  [name]  [facility], [name] Care Management Assistant Direct Dial : [phone]  Fax: [phone]

## 2023-09-18 ENCOUNTER — Other Ambulatory Visit: Payer: Self-pay

## 2023-09-18 DIAGNOSIS — Z4789 Encounter for other orthopedic aftercare: Secondary | ICD-10-CM | POA: Diagnosis not present

## 2023-09-18 DIAGNOSIS — S42222A 2-part displaced fracture of surgical neck of left humerus, initial encounter for closed fracture: Secondary | ICD-10-CM | POA: Diagnosis not present

## 2023-09-18 DIAGNOSIS — S42202D Unspecified fracture of upper end of left humerus, subsequent encounter for fracture with routine healing: Secondary | ICD-10-CM | POA: Diagnosis not present

## 2023-09-18 MED ORDER — HYDROCODONE-ACETAMINOPHEN 5-325 MG PO TABS
1.0000 | ORAL_TABLET | ORAL | 0 refills | Status: DC | PRN
  Filled 2023-09-25: qty 30, 5d supply, fill #0

## 2023-09-19 ENCOUNTER — Other Ambulatory Visit: Payer: Self-pay

## 2023-09-21 ENCOUNTER — Other Ambulatory Visit: Payer: Self-pay

## 2023-09-21 ENCOUNTER — Other Ambulatory Visit: Payer: Self-pay | Admitting: Internal Medicine

## 2023-09-22 ENCOUNTER — Other Ambulatory Visit: Payer: Self-pay

## 2023-09-22 ENCOUNTER — Encounter: Payer: Self-pay | Admitting: Oncology

## 2023-09-22 ENCOUNTER — Telehealth: Payer: Self-pay | Admitting: Pharmacy Technician

## 2023-09-22 ENCOUNTER — Other Ambulatory Visit (HOSPITAL_COMMUNITY): Payer: Self-pay

## 2023-09-22 MED ORDER — METOLAZONE 2.5 MG PO TABS
2.5000 mg | ORAL_TABLET | ORAL | 1 refills | Status: DC
  Filled 2023-09-22: qty 30, 30d supply, fill #0
  Filled 2023-10-06: qty 30, 90d supply, fill #0

## 2023-09-22 NOTE — Telephone Encounter (Signed)
 PER NOTE:  But then:  Patient just got cialis  5mg  on 08/15/23 for 90 for 90 days. Per insurance he can't be on both.

## 2023-09-25 ENCOUNTER — Emergency Department

## 2023-09-25 ENCOUNTER — Inpatient Hospital Stay
Admission: EM | Admit: 2023-09-25 | Discharge: 2023-09-29 | DRG: 150 | Disposition: A | Discharge status: Home Health | Attending: Student | Admitting: Student

## 2023-09-25 ENCOUNTER — Other Ambulatory Visit: Payer: Self-pay

## 2023-09-25 DIAGNOSIS — I5022 Chronic systolic (congestive) heart failure: Secondary | ICD-10-CM | POA: Diagnosis present

## 2023-09-25 DIAGNOSIS — N186 End stage renal disease: Secondary | ICD-10-CM | POA: Diagnosis present

## 2023-09-25 DIAGNOSIS — R569 Unspecified convulsions: Secondary | ICD-10-CM | POA: Diagnosis not present

## 2023-09-25 DIAGNOSIS — R040 Epistaxis: Secondary | ICD-10-CM | POA: Diagnosis not present

## 2023-09-25 DIAGNOSIS — F32A Depression, unspecified: Secondary | ICD-10-CM | POA: Diagnosis present

## 2023-09-25 DIAGNOSIS — Z9884 Bariatric surgery status: Secondary | ICD-10-CM

## 2023-09-25 DIAGNOSIS — Z8616 Personal history of COVID-19: Secondary | ICD-10-CM

## 2023-09-25 DIAGNOSIS — D61818 Other pancytopenia: Secondary | ICD-10-CM | POA: Diagnosis not present

## 2023-09-25 DIAGNOSIS — Z940 Kidney transplant status: Secondary | ICD-10-CM | POA: Diagnosis not present

## 2023-09-25 DIAGNOSIS — Z992 Dependence on renal dialysis: Secondary | ICD-10-CM

## 2023-09-25 DIAGNOSIS — E119 Type 2 diabetes mellitus without complications: Secondary | ICD-10-CM | POA: Diagnosis present

## 2023-09-25 DIAGNOSIS — Z8249 Family history of ischemic heart disease and other diseases of the circulatory system: Secondary | ICD-10-CM

## 2023-09-25 DIAGNOSIS — I2721 Secondary pulmonary arterial hypertension: Secondary | ICD-10-CM | POA: Diagnosis not present

## 2023-09-25 DIAGNOSIS — Z79621 Long term (current) use of calcineurin inhibitor: Secondary | ICD-10-CM

## 2023-09-25 DIAGNOSIS — J45909 Unspecified asthma, uncomplicated: Secondary | ICD-10-CM | POA: Diagnosis present

## 2023-09-25 DIAGNOSIS — R9082 White matter disease, unspecified: Secondary | ICD-10-CM | POA: Diagnosis not present

## 2023-09-25 DIAGNOSIS — K766 Portal hypertension: Secondary | ICD-10-CM | POA: Diagnosis present

## 2023-09-25 DIAGNOSIS — K219 Gastro-esophageal reflux disease without esophagitis: Secondary | ICD-10-CM | POA: Diagnosis present

## 2023-09-25 DIAGNOSIS — S300XXA Contusion of lower back and pelvis, initial encounter: Secondary | ICD-10-CM | POA: Diagnosis present

## 2023-09-25 DIAGNOSIS — N400 Enlarged prostate without lower urinary tract symptoms: Secondary | ICD-10-CM | POA: Diagnosis present

## 2023-09-25 DIAGNOSIS — I700 Atherosclerosis of aorta: Secondary | ICD-10-CM | POA: Diagnosis present

## 2023-09-25 DIAGNOSIS — R918 Other nonspecific abnormal finding of lung field: Secondary | ICD-10-CM | POA: Diagnosis not present

## 2023-09-25 DIAGNOSIS — I5021 Acute systolic (congestive) heart failure: Secondary | ICD-10-CM | POA: Diagnosis not present

## 2023-09-25 DIAGNOSIS — Z79890 Hormone replacement therapy (postmenopausal): Secondary | ICD-10-CM

## 2023-09-25 DIAGNOSIS — Z87891 Personal history of nicotine dependence: Secondary | ICD-10-CM

## 2023-09-25 DIAGNOSIS — D509 Iron deficiency anemia, unspecified: Secondary | ICD-10-CM | POA: Diagnosis present

## 2023-09-25 DIAGNOSIS — W010XXA Fall on same level from slipping, tripping and stumbling without subsequent striking against object, initial encounter: Secondary | ICD-10-CM | POA: Diagnosis present

## 2023-09-25 DIAGNOSIS — I110 Hypertensive heart disease with heart failure: Secondary | ICD-10-CM | POA: Diagnosis present

## 2023-09-25 DIAGNOSIS — D638 Anemia in other chronic diseases classified elsewhere: Secondary | ICD-10-CM | POA: Diagnosis not present

## 2023-09-25 DIAGNOSIS — I872 Venous insufficiency (chronic) (peripheral): Secondary | ICD-10-CM | POA: Diagnosis present

## 2023-09-25 DIAGNOSIS — I350 Nonrheumatic aortic (valve) stenosis: Secondary | ICD-10-CM | POA: Diagnosis present

## 2023-09-25 DIAGNOSIS — E785 Hyperlipidemia, unspecified: Secondary | ICD-10-CM | POA: Diagnosis present

## 2023-09-25 DIAGNOSIS — G459 Transient cerebral ischemic attack, unspecified: Secondary | ICD-10-CM | POA: Diagnosis present

## 2023-09-25 DIAGNOSIS — Z794 Long term (current) use of insulin: Secondary | ICD-10-CM | POA: Diagnosis not present

## 2023-09-25 DIAGNOSIS — Z9841 Cataract extraction status, right eye: Secondary | ICD-10-CM

## 2023-09-25 DIAGNOSIS — S0990XA Unspecified injury of head, initial encounter: Secondary | ICD-10-CM | POA: Diagnosis not present

## 2023-09-25 DIAGNOSIS — R4182 Altered mental status, unspecified: Secondary | ICD-10-CM | POA: Diagnosis not present

## 2023-09-25 DIAGNOSIS — I482 Chronic atrial fibrillation, unspecified: Secondary | ICD-10-CM | POA: Diagnosis present

## 2023-09-25 DIAGNOSIS — R404 Transient alteration of awareness: Secondary | ICD-10-CM | POA: Diagnosis not present

## 2023-09-25 DIAGNOSIS — S4292XA Fracture of left shoulder girdle, part unspecified, initial encounter for closed fracture: Secondary | ICD-10-CM | POA: Diagnosis present

## 2023-09-25 DIAGNOSIS — Z974 Presence of external hearing-aid: Secondary | ICD-10-CM

## 2023-09-25 DIAGNOSIS — D62 Acute posthemorrhagic anemia: Secondary | ICD-10-CM | POA: Diagnosis present

## 2023-09-25 DIAGNOSIS — Z91041 Radiographic dye allergy status: Secondary | ICD-10-CM

## 2023-09-25 DIAGNOSIS — W19XXXA Unspecified fall, initial encounter: Secondary | ICD-10-CM | POA: Diagnosis not present

## 2023-09-25 DIAGNOSIS — I050 Rheumatic mitral stenosis: Secondary | ICD-10-CM | POA: Diagnosis present

## 2023-09-25 DIAGNOSIS — Z833 Family history of diabetes mellitus: Secondary | ICD-10-CM

## 2023-09-25 DIAGNOSIS — I517 Cardiomegaly: Secondary | ICD-10-CM | POA: Diagnosis not present

## 2023-09-25 DIAGNOSIS — Y92008 Other place in unspecified non-institutional (private) residence as the place of occurrence of the external cause: Secondary | ICD-10-CM | POA: Diagnosis not present

## 2023-09-25 DIAGNOSIS — E291 Testicular hypofunction: Secondary | ICD-10-CM | POA: Diagnosis present

## 2023-09-25 DIAGNOSIS — D84821 Immunodeficiency due to drugs: Secondary | ICD-10-CM | POA: Diagnosis not present

## 2023-09-25 DIAGNOSIS — N529 Male erectile dysfunction, unspecified: Secondary | ICD-10-CM | POA: Diagnosis present

## 2023-09-25 DIAGNOSIS — Z7984 Long term (current) use of oral hypoglycemic drugs: Secondary | ICD-10-CM

## 2023-09-25 DIAGNOSIS — Z841 Family history of disorders of kidney and ureter: Secondary | ICD-10-CM

## 2023-09-25 DIAGNOSIS — Z886 Allergy status to analgesic agent status: Secondary | ICD-10-CM

## 2023-09-25 DIAGNOSIS — Z7985 Long-term (current) use of injectable non-insulin antidiabetic drugs: Secondary | ICD-10-CM

## 2023-09-25 DIAGNOSIS — R0602 Shortness of breath: Secondary | ICD-10-CM | POA: Diagnosis not present

## 2023-09-25 DIAGNOSIS — Z560 Unemployment, unspecified: Secondary | ICD-10-CM

## 2023-09-25 DIAGNOSIS — Z79899 Other long term (current) drug therapy: Secondary | ICD-10-CM

## 2023-09-25 DIAGNOSIS — Z951 Presence of aortocoronary bypass graft: Secondary | ICD-10-CM

## 2023-09-25 DIAGNOSIS — Z7952 Long term (current) use of systemic steroids: Secondary | ICD-10-CM

## 2023-09-25 DIAGNOSIS — Z043 Encounter for examination and observation following other accident: Secondary | ICD-10-CM | POA: Diagnosis not present

## 2023-09-25 DIAGNOSIS — Z955 Presence of coronary angioplasty implant and graft: Secondary | ICD-10-CM

## 2023-09-25 DIAGNOSIS — D631 Anemia in chronic kidney disease: Secondary | ICD-10-CM | POA: Diagnosis not present

## 2023-09-25 DIAGNOSIS — I447 Left bundle-branch block, unspecified: Secondary | ICD-10-CM | POA: Diagnosis present

## 2023-09-25 DIAGNOSIS — J342 Deviated nasal septum: Secondary | ICD-10-CM | POA: Diagnosis not present

## 2023-09-25 DIAGNOSIS — G319 Degenerative disease of nervous system, unspecified: Secondary | ICD-10-CM | POA: Diagnosis not present

## 2023-09-25 DIAGNOSIS — R0989 Other specified symptoms and signs involving the circulatory and respiratory systems: Secondary | ICD-10-CM | POA: Diagnosis not present

## 2023-09-25 DIAGNOSIS — R464 Slowness and poor responsiveness: Secondary | ICD-10-CM | POA: Diagnosis not present

## 2023-09-25 DIAGNOSIS — I2510 Atherosclerotic heart disease of native coronary artery without angina pectoris: Secondary | ICD-10-CM | POA: Diagnosis present

## 2023-09-25 DIAGNOSIS — G4733 Obstructive sleep apnea (adult) (pediatric): Secondary | ICD-10-CM | POA: Diagnosis present

## 2023-09-25 DIAGNOSIS — I255 Ischemic cardiomyopathy: Secondary | ICD-10-CM | POA: Diagnosis present

## 2023-09-25 DIAGNOSIS — Z9842 Cataract extraction status, left eye: Secondary | ICD-10-CM

## 2023-09-25 DIAGNOSIS — Z7901 Long term (current) use of anticoagulants: Secondary | ICD-10-CM

## 2023-09-25 LAB — CBC
HCT: 26.7 % — ABNORMAL LOW (ref 39.0–52.0)
HCT: 26.6 % — ABNORMAL LOW (ref 39.0–52.0)
HCT: 26.4 % — ABNORMAL LOW (ref 39.0–52.0)
Hemoglobin: 8 g/dL — ABNORMAL LOW (ref 13.0–17.0)
Hemoglobin: 8.2 g/dL — ABNORMAL LOW (ref 13.0–17.0)
Hemoglobin: 8.2 g/dL — ABNORMAL LOW (ref 13.0–17.0)
MCH: 28.7 pg (ref 26.0–34.0)
MCH: 29.2 pg (ref 26.0–34.0)
MCH: 29.3 pg (ref 26.0–34.0)
MCHC: 30 g/dL (ref 30.0–36.0)
MCHC: 30.8 g/dL (ref 30.0–36.0)
MCHC: 31.1 g/dL (ref 30.0–36.0)
MCV: 95.7 fL (ref 80.0–100.0)
MCV: 94.7 fL (ref 80.0–100.0)
MCV: 94.3 fL (ref 80.0–100.0)
Platelets: 133 10*3/uL — ABNORMAL LOW (ref 150–400)
Platelets: 123 10*3/uL — ABNORMAL LOW (ref 150–400)
Platelets: 116 10*3/uL — ABNORMAL LOW (ref 150–400)
RBC: 2.79 MIL/uL — ABNORMAL LOW (ref 4.22–5.81)
RBC: 2.81 MIL/uL — ABNORMAL LOW (ref 4.22–5.81)
RBC: 2.8 MIL/uL — ABNORMAL LOW (ref 4.22–5.81)
RDW: 17.2 % — ABNORMAL HIGH (ref 11.5–15.5)
RDW: 17.1 % — ABNORMAL HIGH (ref 11.5–15.5)
RDW: 17.2 % — ABNORMAL HIGH (ref 11.5–15.5)
WBC: 3.7 10*3/uL — ABNORMAL LOW (ref 4.0–10.5)
WBC: 3.1 10*3/uL — ABNORMAL LOW (ref 4.0–10.5)
WBC: 3 10*3/uL — ABNORMAL LOW (ref 4.0–10.5)
nRBC: 0 % (ref 0.0–0.2)
nRBC: 0 % (ref 0.0–0.2)
nRBC: 0 % (ref 0.0–0.2)

## 2023-09-25 MED ORDER — LIDOCAINE VISCOUS HCL 2 % MT SOLN
15.0000 mL | Freq: Once | OROMUCOSAL | Status: AC
  Administered 2023-09-25: 15 mL via OROMUCOSAL
  Filled 2023-09-25: qty 15

## 2023-09-25 MED ORDER — FUROSEMIDE 10 MG/ML IJ SOLN
40.0000 mg | Freq: Two times a day (BID) | INTRAMUSCULAR | Status: DC
  Administered 2023-09-25: 40 mg via INTRAVENOUS
  Filled 2023-09-25: qty 4

## 2023-09-25 MED ORDER — MONTELUKAST SODIUM 10 MG PO TABS
10.0000 mg | ORAL_TABLET | Freq: Every day | ORAL | Status: DC
  Administered 2023-09-25 – 2023-09-28 (×4): 10 mg via ORAL
  Filled 2023-09-25 (×4): qty 1

## 2023-09-25 MED ORDER — TACROLIMUS 1 MG PO CAPS
2.0000 mg | ORAL_CAPSULE | Freq: Every day | ORAL | Status: DC
  Administered 2023-09-25 – 2023-09-29 (×5): 2 mg via ORAL
  Filled 2023-09-25 (×6): qty 2

## 2023-09-25 MED ORDER — ALPRAZOLAM 0.5 MG PO TABS
0.5000 mg | ORAL_TABLET | Freq: Once | ORAL | Status: AC
  Administered 2023-09-25: 0.5 mg via ORAL
  Filled 2023-09-25: qty 1

## 2023-09-25 MED ORDER — SALINE SPRAY 0.65 % NA SOLN
2.0000 | Freq: Three times a day (TID) | NASAL | Status: DC
  Administered 2023-09-25 – 2023-09-29 (×12): 2 via NASAL
  Filled 2023-09-25 (×2): qty 44

## 2023-09-25 MED ORDER — ROSUVASTATIN CALCIUM 10 MG PO TABS
10.0000 mg | ORAL_TABLET | Freq: Every day | ORAL | Status: DC
  Administered 2023-09-25 – 2023-09-29 (×5): 10 mg via ORAL
  Filled 2023-09-25 (×6): qty 1

## 2023-09-25 MED ORDER — OXYCODONE-ACETAMINOPHEN 5-325 MG PO TABS
2.0000 | ORAL_TABLET | Freq: Four times a day (QID) | ORAL | 0 refills | Status: DC | PRN
  Filled 2023-09-25: qty 16, 2d supply, fill #0

## 2023-09-25 MED ORDER — PANTOPRAZOLE SODIUM 40 MG PO TBEC
40.0000 mg | DELAYED_RELEASE_TABLET | Freq: Every day | ORAL | Status: DC
  Administered 2023-09-25 – 2023-09-29 (×5): 40 mg via ORAL
  Filled 2023-09-25 (×4): qty 1

## 2023-09-25 MED ORDER — ALBUTEROL SULFATE (2.5 MG/3ML) 0.083% IN NEBU: 2.5000 mg | INHALATION_SOLUTION | Freq: Four times a day (QID) | RESPIRATORY_TRACT | Status: DC | PRN

## 2023-09-25 MED ORDER — TAMSULOSIN HCL 0.4 MG PO CAPS
0.4000 mg | ORAL_CAPSULE | Freq: Every day | ORAL | Status: DC
  Administered 2023-09-25 – 2023-09-29 (×5): 0.4 mg via ORAL
  Filled 2023-09-25 (×5): qty 1

## 2023-09-25 MED ORDER — HYDROCODONE-ACETAMINOPHEN 5-325 MG PO TABS
1.0000 | ORAL_TABLET | ORAL | Status: DC | PRN
  Administered 2023-09-25 – 2023-09-29 (×10): 1 via ORAL
  Filled 2023-09-25 (×11): qty 1

## 2023-09-25 MED ORDER — CEFADROXIL 500 MG PO CAPS
500.0000 mg | ORAL_CAPSULE | Freq: Two times a day (BID) | ORAL | 0 refills | Status: DC
  Filled 2023-09-25: qty 14, 7d supply, fill #0

## 2023-09-25 MED ORDER — TACROLIMUS 1 MG PO CAPS
1.0000 mg | ORAL_CAPSULE | Freq: Every day | ORAL | Status: DC
  Administered 2023-09-25 – 2023-09-28 (×4): 1 mg via ORAL
  Filled 2023-09-25 (×5): qty 1

## 2023-09-25 MED ORDER — GABAPENTIN 300 MG PO CAPS
300.0000 mg | ORAL_CAPSULE | Freq: Two times a day (BID) | ORAL | Status: DC
  Administered 2023-09-25 – 2023-09-29 (×9): 300 mg via ORAL
  Filled 2023-09-25 (×9): qty 1

## 2023-09-25 MED ORDER — PREDNISONE 10 MG PO TABS
5.0000 mg | ORAL_TABLET | Freq: Every day | ORAL | Status: DC
  Administered 2023-09-25 – 2023-09-29 (×5): 5 mg via ORAL
  Filled 2023-09-25 (×5): qty 1

## 2023-09-25 MED ORDER — TRANEXAMIC ACID FOR EPISTAXIS
500.0000 mg | Freq: Once | TOPICAL | Status: AC
  Administered 2023-09-25: 500 mg via TOPICAL
  Filled 2023-09-25 (×2): qty 10

## 2023-09-25 MED ORDER — OXYMETAZOLINE HCL 0.05 % NA SOLN
1.0000 | Freq: Once | NASAL | Status: AC
  Administered 2023-09-25: 1 via NASAL
  Filled 2023-09-25: qty 30

## 2023-09-25 MED ORDER — OXYMETAZOLINE HCL 0.05 % NA SOLN
2.0000 | Freq: Two times a day (BID) | NASAL | Status: AC
  Administered 2023-09-25 – 2023-09-27 (×6): 2 via NASAL
  Filled 2023-09-25: qty 15

## 2023-09-25 MED ORDER — AMOXICILLIN-POT CLAVULANATE 875-125 MG PO TABS
1.0000 | ORAL_TABLET | Freq: Two times a day (BID) | ORAL | Status: DC
  Administered 2023-09-25 (×2): 1 via ORAL
  Filled 2023-09-25 (×2): qty 1

## 2023-09-25 MED ORDER — OXYCODONE-ACETAMINOPHEN 5-325 MG PO TABS
2.0000 | ORAL_TABLET | Freq: Once | ORAL | Status: AC
  Administered 2023-09-25: 2 via ORAL
  Filled 2023-09-25: qty 2

## 2023-09-25 MED ORDER — MORPHINE SULFATE (PF) 2 MG/ML IV SOLN
1.0000 mg | Freq: Once | INTRAVENOUS | Status: AC
  Administered 2023-09-25: 1 mg via INTRAVENOUS
  Filled 2023-09-25: qty 1

## 2023-09-25 MED ORDER — CEPHALEXIN 500 MG PO CAPS
500.0000 mg | ORAL_CAPSULE | Freq: Once | ORAL | Status: AC
  Administered 2023-09-25: 500 mg via ORAL
  Filled 2023-09-25: qty 1

## 2023-09-25 MED ORDER — BUPROPION HCL ER (SR) 150 MG PO TB12
150.0000 mg | ORAL_TABLET | Freq: Two times a day (BID) | ORAL | Status: DC
  Administered 2023-09-25 – 2023-09-29 (×9): 150 mg via ORAL
  Filled 2023-09-25 (×11): qty 1

## 2023-09-25 MED ORDER — HYDROXYZINE HCL 25 MG PO TABS
25.0000 mg | ORAL_TABLET | Freq: Once | ORAL | Status: AC
  Administered 2023-09-25: 25 mg via ORAL
  Filled 2023-09-25: qty 1

## 2023-09-25 NOTE — ED Notes (Signed)
 MD aware pt c/o bleeding at this time. Nose clamp applied.

## 2023-09-25 NOTE — H&P (Signed)
 History and Physical    Patient: [name]. FMW:[id number] DOB: 1960-06-11 DOA: 09/25/2023 DOS: the patient was seen and examined on 09/25/2023 PCP: [name], MD   Chief Complaint:  Chief Complaint  Patient presents with   Epistaxis   HPI: [name]. is a 63 y.o. male  who presents to the hospital due to nosebleed from a fall. He fell and landed on his face. Of note he is on anti-coagulation with eliquis  for afib. The ER doctor was dealing with persistent bleeding from the nose and as a result Dr. Milissa from ENT was consulted and saw the pt in the ER. Pt currently has packing in place and Dr. Milissa requested that the pt be placed in the hospital due to ongoing anemia and requirement for nasal packing.   Review of Systems: Review of Systems  Constitutional:  Negative for chills and fever.  HENT:  Negative for hearing loss and tinnitus.   Eyes:  Negative for redness.  Respiratory:  Negative for cough.   Cardiovascular:  Negative for chest pain and palpitations.  Gastrointestinal:  Negative for nausea and vomiting.  Genitourinary:  Negative for dysuria.  Musculoskeletal:  Positive for falls. Negative for myalgias.  Skin:  Negative for rash.  Neurological:  Negative for dizziness and headaches.  Endo/Heme/Allergies:  Negative for polydipsia. Bruises/bleeds easily.  Psychiatric/Behavioral:  Negative for depression.     Past Medical History:  Diagnosis Date   Anal condyloma    Anemia    Aortic atherosclerosis (HCC)    Aortic stenosis    a.) TTE 10/2021: Mod AS. AVA 1.06cm^2 (VTI). Mean grad ; b.) TTE 04/02/2022: no AV stenosis   Asthma, persistent controlled 02/25/2013   Attention or concentration deficit 04/26/2021   BPH with obstruction/lower urinary tract symptoms 09/25/2014   CAD (coronary artery disease) 2005   a.) LHC 1996 -> HG stenosis mLAD -> PTCA/PCI with stent x 1 (unk type) -> complicated by CFA pseudoanurysm (required surg);  b.)  LHC 08/10/2002  -> 95% LAD, 70% ISR LAD, 80% pOM, 70% mRCA --> CVTS consult; c.) 4v CABG 08/13/2002; c.) 03/2018 MV: Small, fixed inferoapical and apical sep defect. No isc. EF 47% (60-65% by 05/2018 TTE); d.) 02/2021 MV: small Apical inf fixed defect. No isc -> low risk.   Cardiomegaly    Cellulitis and abscess of left leg 07/22/2020   Chronic atrial fibrillation    a.) CHA2DS2VASc = 4 (CHF, HTN, vascular disease history, T2DM);  b.) rate/rhythm maintained without pharmacological intervention; chronically anticoagulated with apixaban    Chronic combined systolic and diastolic CHF (congestive heart failure)    a.) TTE 7/18 : EF 45-50%; b.) TTE 05/2018 : EF 60-65%, RVSP 74.8; c.) TTE 12/2018: EF 50-55%. Sev dil LA; d.) TTE 04/2019: EF 45-50%; e.) TTE 07/2021: EF 40-45%, G3DD; f.) TTE 10/2021: EF 40-45%, mild conc LVH, septal-lat dyssynchrony (LBBB), mildly red RVSF, mild-mod dil LA, mildly dil RA, mild-mod MS, mod AS; g.) TTE 04/02/2022: EF 40-45%, glob HK, LVH, red RVSF, RVE, sev LAE, mild-mod RAE, mild MR   Chronic venous stasis dermatitis of both lower extremities    Cytomegaloviral disease 2017   Diverticulosis of colon 04/22/2013   Dyspnea    Elevated RIGHT hemidiaphragm    Erectile dysfunction    a.) on topical TRT + Trimix (alprostadil/papaverine/phentolamine) injections   ESRD (end stage renal disease) (HCC)    a.) dialysis dependent 2004-2012; b.) s/p cadaveric RIGHT renal transplant 09/13/2010   Essential hypertension 12/17/2010  GERD (gastroesophageal reflux disease)    Gout    History of 2019 novel coronavirus disease (COVID-19) 06/30/2019   History of bilateral cataract extraction 10/2017   History of renal transplant 09/13/2010   a.) s/p cadaveric donor transplant 09/13/2010   Hx of bilateral cataract extraction 10/2017   Hyperlipidemia    Hypogonadism in male 09/25/2014   Ischemic cardiomyopathy    Left cephalic vein thrombosis 06/2019   Long term current use of anticoagulant     a.) apixaban    Long term current use of immunosuppressive drug    a.) mycophenolate  + prednisone  + tacrolimus    Major depressive disorder 10/04/2013   Mitral stenosis 11/07/2021   a.) TTE 11/07/2021: severe MAC, mild-mod MS (MPG 6 mmHg); b.) TTE 04/02/2022: no MV stenosis   Murmur    Obesity hypoventilation syndrome 03/31/2012   OSA treated with BiPAP 09/29/2012   PAH (pulmonary artery hypertension)    a. 04/2019 RHC: RA 19, RV 80/20, PA 78/31 (51), PCWP 25, CO/CI 7.69/3.26. PVR 3.25 --> Sev PAH, likely primarily PV HTN; b.) RHC 04/04/2022: mRA 13, mPA 40, mPCWP 20, PA sat 59, AO sat 92, CO 7.63, CI 3.26, PVR 2.6 --> mod portal venous HTN   Pancreatitis    S/P CABG x 4 08/13/2002   a.) LIMA-LAD, LRA-OM, SVG-D1, SVG-dRCA   S/P gastric bypass    Synovitis of finger 06/11/2019   Trigger finger, unspecified little finger 12/23/2018   Type 2 diabetes mellitus with hyperglycemia, with long-term current use of insulin  09/09/2014   a.) has FreeStyle Libre CGM   Ulcer    Left shin goes to wound care center at [facility] qweek   Wears glasses    Wears hearing aid in both ears    Past Surgical History:  Procedure Laterality Date   CARDIAC CATHETERIZATION N/A 08/10/2002   CATARACT EXTRACTION W/PHACO Right 10/29/2017   Procedure: CATARACT EXTRACTION PHACO AND INTRAOCULAR LENS PLACEMENT (IOC)  RIGHT DIABETIC;  Surgeon: Mittie Gaskin, MD;  Location: [facility];  Service: Ophthalmology;  Laterality: Right   CATARACT EXTRACTION W/PHACO Left 11/18/2017   Procedure: CATARACT EXTRACTION PHACO AND INTRAOCULAR LENS PLACEMENT (IOC) LEFT IVA/TOPICAL;  Surgeon: Mittie Gaskin, MD;  Location: [facility];  Service: Ophthalmology;  Laterality: Left   COLONOSCOPY WITH PROPOFOL  N/A 04/22/2013   Procedure: COLONOSCOPY WITH PROPOFOL ;  Surgeon: Toribio SHAUNNA Cedar, MD;  Location: [facility];  Service: Endoscopy;  Laterality: N/A;   CORONARY ANGIOPLASTY WITH STENT PLACEMENT N/A 1996    CORONARY ARTERY BYPASS GRAFT N/A 08/13/2002   Procedure: 4v CORONARY ARTERY BYPASS GRAFTING; Location: [facility]; Surgeon: Dessa Laine, MD   CYSTOSCOPY WITH INSERTION OF UROLIFT N/A 12/25/2021   Procedure: CYSTOSCOPY WITH INSERTION OF UROLIFT;  Surgeon: Twylla Glendia BROCKS, MD;  Location: [facility];  Service: Urology;  Laterality: N/A;   DG ANGIO AV SHUNT*L*     right and left upper arms   FASCIOTOMY  03/03/2012   Procedure: FASCIOTOMY;  Surgeon: Arley JONELLE Curia, MD;  Location: [facility];  Service: Orthopedics;  Laterality: Right;  FASCIOTOMY RIGHT SMALL FINGER   FASCIOTOMY Left 08/17/2013   Procedure: FASCIOTOMY LEFT RING;  Surgeon: Arley JONELLE Curia, MD;  Location: [facility];  Service: Orthopedics;  Laterality: Left;   INCISION AND DRAINAGE ABSCESS Left 10/15/2015   Procedure: INCISION AND DRAINAGE ABSCESS;  Surgeon: Laneta JULIANNA Luna, MD;  Location: [facility];  Service: General;  Laterality: Left;   KIDNEY TRANSPLANT  09/13/2010   cadaver--at [facility]  LASER ABLATION CONDOLAMATA N/A 01/08/2023   Procedure: LASER REMOVAL ABLATION OF CONDYLOMATA;  Surgeon: Sheldon Standing, MD;  Location: [facility];  Service: General;  Laterality: N/A;  GEN AND LOCAL   RECTAL EXAM UNDER ANESTHESIA N/A 01/08/2023   Procedure: ANORECTAL EXAM UNDER ANESTHESIA;  Surgeon: Sheldon Standing, MD;  Location: [facility];  Service: General;  Laterality: N/A;   RIGHT HEART CATH N/A 11/15/2016   Procedure: RIGHT HEART CATH;  Surgeon: Darron Deatrice LABOR, MD;  Location: [facility];  Service: Cardiovascular;  Laterality: N/A;   RIGHT HEART CATH N/A 05/03/2019   Procedure: RIGHT HEART CATH;  Surgeon: [name], MD;  Location: [facility];  Service: Cardiovascular;  Laterality: N/A;   RIGHT HEART CATH N/A 04/04/2022   Procedure: RIGHT HEART CATH;  Surgeon: [name], MD;  Location: [facility] CV [facility];  Service: Cardiovascular;  Laterality: N/A;   ROUX-EN-Y GASTRIC BYPASS N/A 2015   TYMPANIC  MEMBRANE REPAIR Left 03/2010   VASECTOMY     WART FULGURATION N/A 05/31/2022   Procedure: FULGURATION ANAL WART;  Surgeon: Tye Millet, DO;  Location: [facility];  Service: General;  Laterality: N/A;   Social History:  reports that he quit smoking about 29 years ago. His smoking use included cigars. He has never been exposed to tobacco smoke. He has never used smokeless tobacco. He reports that he does not currently use alcohol. He reports that he does not use drugs.  Allergies  Allergen Reactions   Iodinated Contrast Media Other (See Comments)    Kidney transplant  Iodinated contrast media (substance)   Iodine Other (See Comments)    Kidney transplant   Acetaminophen  Other (See Comments)    BC of transplant  acetaminophen    Nsaids Other (See Comments)    Kidney transplant   Ibuprofen Other (See Comments)    Due to kidney transplant    Family History  Problem Relation Age of Onset   Heart disease Father    Kidney failure Father    Kidney disease Father    Diabetes Maternal Grandmother    Breast cancer Maternal Grandmother    Valvular heart disease Mother    Liver cancer Paternal Uncle    Liver cancer Paternal Grandmother    Prostate cancer Neg Hx     Prior to Admission medications   Medication Sig Start Date End Date Taking? Authorizing Provider  albuterol  (PROVENTIL ) (2.5 MG/3ML) 0.083% nebulizer solution Take 3 mLs (2.5 mg total) by nebulization every 6 (six) hours as needed for wheezing or shortness of breath. 11/18/22  Yes [name], MD  albuterol  (VENTOLIN  HFA) 108 (90 Base) MCG/ACT inhaler Inhale 2 puffs into the lungs every 6 (six) hours as needed for wheezing or shortness of breath. 11/18/22  Yes [name], MD  apixaban  (ELIQUIS ) 5 MG TABS tablet Take 1 tablet (5 mg total) by mouth 2 (two) times daily. 10/21/22 10/21/23 Yes [name], MD  buPROPion  (WELLBUTRIN  SR) 150 MG 12 hr tablet Take 1 tablet (150 mg total) by mouth 2 (two) times daily. 05/14/23  05/13/24 Yes [name], MD  cefadroxil (DURICEF) 500 MG capsule Take 1 capsule (500 mg total) by mouth 2 (two) times daily for 7 days. 09/25/23 10/02/23 Yes Gordan Huxley, MD  colchicine  0.6 MG tablet Take 1 tablet (0.6 mg total) by mouth every other day. 06/16/23  Yes   dapagliflozin  propanediol (FARXIGA ) 10 MG TABS tablet Take 1 tablet (10 mg total) by mouth daily  before breakfast. 10/21/22  Yes [name], MD  febuxostat  (ULORIC ) 40 MG tablet Take 3 tablets (120 mg total) by mouth daily. 07/30/23  Yes   fluticasone  (FLONASE ) 50 MCG/ACT nasal spray Place 2 sprays into both nostrils daily. 09/09/23  Yes [name], MD  gabapentin  (NEURONTIN ) 300 MG capsule Take 1 capsule (300 mg total) by mouth 2 (two) times daily. 05/16/23  Yes   HYDROcodone -acetaminophen  (NORCO/VICODIN) 5-325 MG tablet Take 1 tablet by mouth every 4 (four) hours as needed for pain. 09/18/23  Yes   insulin  glargine-yfgn (SEMGLEE ) 100 UNIT/ML Pen Inject 20 Units into the skin daily. 06/16/23  Yes   losartan  (COZAAR ) 25 MG tablet Take 1 tablet (25 mg total) by mouth daily. 04/21/23  Yes [name], MD  metolazone  (ZAROXOLYN ) 2.5 MG tablet Take 1 tablet (2.5 mg total) by mouth as directed. By the heart failure clinic 09/22/23 12/21/23 Yes [name], MD  mycophenolate  (MYFORTIC ) 180 MG EC tablet Take 2 tablets (360 mg total) by mouth every morning AND 1 tablet (180 mg total) every evening. 04/21/23  Yes   omeprazole  (PRILOSEC) 20 MG capsule Take 1 capsule (20 mg total) by mouth daily. 01/09/23 01/09/24 Yes [name], MD  oxyCODONE -acetaminophen  (PERCOCET) 5-325 MG tablet Take 2 tablets by mouth every 6 (six) hours as needed for severe pain (pain score 7-10). 09/25/23  Yes Gordan Huxley, MD  potassium chloride  SA (KLOR-CON  M) 20 MEQ tablet Take 1 tablet (20 mEq total) by mouth daily as needed. 07/29/23  Yes Hayes Beckey CROME, NP  predniSONE  (DELTASONE ) 5 MG tablet Take 1 tablet (5 mg total) by mouth daily. 03/12/23   Yes   rosuvastatin  (CRESTOR ) 10 MG tablet Take 1 tablet (10 mg total) by mouth daily. 09/13/22  Yes [name], MD  silver  sulfADIAZINE  (SILVADENE ) 1 % cream Apply 1 Application topically daily. 09/12/23  Yes [name], MD  sulfamethoxazole -trimethoprim  (BACTRIM ) 400-80 MG tablet Take 1 tablet by mouth 3 (three) times a week. 02/14/23  Yes   tacrolimus  (PROGRAF ) 1 MG capsule Take 2 capsules (2 mg total) by mouth every morning AND 1 capsule (1 mg total) every evening. 03/12/23  Yes   tadalafil  (CIALIS ) 5 MG tablet Take 1 tablet (5 mg total) by mouth daily as needed for erectile dysfunction. 08/15/23  Yes Stoioff, Glendia BROCKS, MD  tamsulosin  (FLOMAX ) 0.4 MG CAPS capsule Take 1 capsule (0.4 mg total) by mouth daily. 01/16/23  Yes Vaillancourt, Samantha, PA-C  Testosterone  20.25 MG/ACT (1.62%) GEL Place 2 Pump onto the skin daily. 05/14/23  Yes McGowan, Clotilda A, PA-C  tirzepatide  (MOUNJARO ) 5 MG/0.5ML Pen Inject 5 mg into the skin once a week. 03/25/23  Yes   tiZANidine  (ZANAFLEX ) 2 MG tablet Take 1 tablet (2 mg total) by mouth every 6 (six) hours as needed for muscle spasms. 06/24/23  Yes [name], MD  torsemide  (DEMADEX ) 20 MG tablet Take 4 tablets (80 mg total) by mouth 2 (two) times daily. 05/05/23  Yes [name], MD  Zinc  50 MG TABS Take 1 tablet (50 mg total) by mouth daily. 04/21/23  Yes [name], MD  azithromycin  (ZITHROMAX ) 500 MG tablet Take 1 tablet (500 mg total) by mouth the night before surgery OR 1 hour prior to surgery. Patient not taking: Reported on 09/25/2023 08/14/23     benzonatate  (TESSALON ) 200 MG capsule Take 1 capsule (200 mg total) by mouth 3 (three) times daily as needed for cough. Patient not  taking: No sig reported 07/17/23   [name], MD  Continuous Glucose Sensor (FREESTYLE LIBRE 3 SENSOR) MISC Use one every 2 weeks. 01/24/23     Cyanocobalamin  (B-12 PO) Take 1 tablet by mouth daily. Patient not taking: Reported on 09/25/2023    [provider]  Insulin  Pen Needle (TECHLITE PEN NEEDLES) 31G X 5 MM MISC 4 (four) times daily. 08/21/22   Brendia Calton Squires, PA-C  Insulin  Pen Needle (TECHLITE PEN NEEDLES) 32G X 6 MM MISC Use 4 times a day 08/26/22     Insulin  Pen Needle (UNIFINE PENTIPS) 31G X 5 MM MISC Use 4 times daily as directed 05/28/21     montelukast  (SINGULAIR ) 10 MG tablet Take 1 tablet (10 mg total) by mouth at bedtime. 01/10/23 01/10/24  [name], MD  ofloxacin  (OCUFLOX ) 0.3 % ophthalmic solution Instill 1 drop into both eyes three times a day Patient not taking: Reported on 09/25/2023 06/18/23     tadalafil , PAH, (ADCIRCA ) 20 MG tablet Take 0.5 tablets (10 mg total) by mouth daily. Patient not taking: Reported on 09/25/2023 08/21/23   [name], MD    Physical Exam: Vitals:   09/25/23 0536 09/25/23 0600 09/25/23 0935 09/25/23 1200  BP: 121/83 120/73 (!) 117/43 (!) 111/51  Pulse: 67 65 63 87  Resp: 18  18 18   Temp: 97.6 F (36.4 C)  98.1 F (36.7 C)   TempSrc:   Oral   SpO2: 98% 99% 99% 100%  Weight: 111 kg     Height: 5' 10 (1.778 m)      Physical Exam HENT:     Head: Normocephalic.     Nose:     Comments: B/l nasal packing  Cardiovascular:     Rate and Rhythm: Normal rate.  Pulmonary:     Effort: Pulmonary effort is normal.  Abdominal:     Palpations: Abdomen is soft.  Musculoskeletal:        General: Normal range of motion.  Skin:    General: Skin is warm.     Comments: 2+ pitting edema b/l lower ext's  Neurological:     Mental Status: He is alert and oriented to person, place, and time.  Psychiatric:        Mood and Affect: Mood normal.     Assessment and Plan: ABLA due to Nose bleed  - Appreciate ENT assistance - Continue nasal packing  - Augmentin  875 mg PO bid  - Afrin nasal spray x 3 days only  - CBC 1 pm and CBC 8 pm 09/25/2023  CHF systolic  - IV lasix  40 mg bid  - ECHO   CAD - Complicating care due to nose bleed   HTN - Monitor; stable    HLD - Crestor  10 mg PO daily   Renal transplant recipient (2012)  - Tacrolimus  1 mg PO bid  - Prednisone  5 mg PO daily  - Protonix  20 mg PO daily   BPH - Flomax  0.4 mg PO daily   DM2 - Complicating care  - Gabapentin  300 mg PO bid   Consults: ENT already consulted by ER attending   Severity of Illness: The appropriate patient status for this patient is INPATIENT. Inpatient status is judged to be reasonable and necessary in order to provide the required intensity of service to ensure the patient's safety. The patient's presenting symptoms, physical exam findings, and initial radiographic and laboratory data in the context of their chronic comorbidities is felt to place them  at high risk for further clinical deterioration. Furthermore, it is not anticipated that the patient will be medically stable for discharge from the hospital within 2 midnights of admission.   * I certify that at the point of admission it is my clinical judgment that the patient will require inpatient hospital care spanning beyond 2 midnights from the point of admission due to high intensity of service, high risk for further deterioration and high frequency of surveillance required.*  Author: Shaya Altamura , MD 09/25/2023 12:59 PM  For on call review [email or web address].

## 2023-09-25 NOTE — ED Notes (Signed)
 Pt presented to ED with c/o intermittent epistaxis starting at 1900 after falling and hitting his face on the deck.  Pt denies LOC. On Eliquis  for afib. States used some clotting medication to try and stop bleeding but did not help, also states had vision changes after using clotting medication.

## 2023-09-25 NOTE — ED Notes (Signed)
 Gordan MD made aware pt c/o bleeding out of left eye.

## 2023-09-25 NOTE — Discharge Instructions (Addendum)
 You will need to call today to schedule follow-up appointment in the ENT clinic with Dr. Blair or one of his colleagues.  This should be in four days.   Please purchase over the counter nasal spray. You will need to use this three times a day until you see the ENT doctor.    Please schedule follow up with your PCP or cardiology team as well to discuss when to resume your eliquis  (Blood thinner). This appointment should also be this week. Return to the emergency department if you develop new or worsening symptoms that concern you.

## 2023-09-25 NOTE — Consult Note (Signed)
 WOC team consulted for LLE wound.  Secure chat to primary team requesting photo documentation of wound.   Please note that the [facility] nursing team is utilizing a standardized work plan to manage patient consults. We are triaging consults and will try to see the patients within 48 hours. Wound photos in the patient's chart allow us  to consult on the patient in the most efficient and timely manner.    Thank you,    Powell Bar MSN, RN-BC, [employer]

## 2023-09-25 NOTE — ED Provider Notes (Signed)
 [facility] Provider Note    Event Date/Time   First MD Initiated Contact with Patient 09/25/23 [phone]     (approximate)   History   Epistaxis   HPI [name]. is a 63 y.o. male with an extensive chronic medical history that includes anticoagulation with Eliquis .  He presents for evaluation of facial trauma and bilateral nosebleed.  He said he had a mechanical fall tonight, slipping on the porch and landing on his face.  He has a recent shoulder fracture on the left side so he was not able to catch himself.  He did not lose consciousness.  His face and head that hurts a little bit but he is having nosebleed from both sides.  No trouble breathing at this time.  Reportedly the injury happened around 8 PM, approximately 9-1/2 hours ago     Physical Exam   Triage Vital Signs: ED Triage Vitals  Encounter Vitals Group     BP 09/25/23 0536 121/83     Girls Systolic BP Percentile --      Girls Diastolic BP Percentile --      Boys Systolic BP Percentile --      Boys Diastolic BP Percentile --      Pulse Rate 09/25/23 0536 67     Resp 09/25/23 0536 18     Temp 09/25/23 0536 97.6 F (36.4 C)     Temp src --      SpO2 09/25/23 0536 98 %     Weight 09/25/23 0536 111 kg (244 lb 11.4 oz)     Height 09/25/23 0536 1.778 m (5' 10)     Head Circumference --      Peak Flow --      Pain Score 09/25/23 0535 0     Pain Loc --      Pain Education --      Exclude from Growth Chart --     Most recent vital signs: Vitals:   09/25/23 0536 09/25/23 0600  BP: 121/83 120/73  Pulse: 67 65  Resp: 18   Temp: 97.6 F (36.4 C)   SpO2: 98% 99%    General: Awake, alert, coherent and conversant. HEENT: No obvious nasal deformity, brisk but steady flow of blood from bilateral nares, but the primary bleed seems to be coming from the right naris, and I think the bleeding from the left may be overflow.  Tender to palpation of the nose as well as the maxillary  sinuses.  Extraocular movement is intact with no evidence of contusion or injury to the orbits. CV:  Good peripheral perfusion.  Resp:  Normal effort. Speaking easily and comfortably, no accessory muscle usage nor intercostal retractions.   Abd:  No distention.  Other:  Patient is wearing a sling on his left shoulder from his recent injury.  No tenderness to palpation of the cervical spine.  No evidence of head injury other than the facial injury as described above.   ED Results / Procedures / Treatments   Labs (all labs ordered are listed, but only abnormal results are displayed) Labs Reviewed  CBC     RADIOLOGY See ED course for details   PROCEDURES:  Critical Care performed: No  Epistaxis Management  Date/Time: 09/25/2023 6:38 AM  Performed by: Gordan Huxley, MD Authorized by: Gordan Huxley, MD   Consent:    Consent obtained:  Verbal   Risks discussed:  Bleeding, infection, nasal injury and pain Universal protocol:  Patient identity confirmed:  Verbally with patient Anesthesia:    Anesthesia method:  None Procedure details:    Treatment site:  L anterior and R anterior   Repair method: nasal clip.   Treatment complexity:  Limited   Treatment episode: initial   Post-procedure details:    Assessment:  No improvement Epistaxis Management  Date/Time: 09/25/2023 7:35 AM  Performed by: Gordan Huxley, MD Authorized by: Gordan Huxley, MD   Consent:    Consent obtained:  Verbal   Risks discussed:  Bleeding, infection, nasal injury and pain Universal protocol:    Patient identity confirmed:  Verbally with patient Anesthesia:    Anesthesia method:  Topical application   Topical anesthetic:  Lidocaine  gel Procedure details:    Treatment site:  R anterior   Treatment method:  Merocel sponge (injected TXA into merocel)   Treatment complexity:  Limited   Treatment episode: recurring   Post-procedure details:    Assessment:  Bleeding stopped   Procedure completion:   Tolerated well, no immediate complications Epistaxis Management  Date/Time: 09/25/2023 7:49 AM  Performed by: Gordan Huxley, MD Authorized by: Gordan Huxley, MD   Consent:    Consent obtained:  Verbal   Consent given by:  Patient   Risks discussed:  Bleeding, infection, nasal injury and pain Universal protocol:    Patient identity confirmed:  Verbally with patient Anesthesia:    Anesthesia method:  Topical application   Topical anesthetic:  Lidocaine  gel Procedure details:    Treatment site:  R anterior   Treatment method:  Merocel sponge (injected with TXA and Afrin)   Treatment complexity:  Limited   Treatment episode: recurring   Post-procedure details:    Assessment:  Bleeding stopped   Procedure completion:  Tolerated well, no immediate complications     IMPRESSION / MDM / ASSESSMENT AND PLAN / ED COURSE  I reviewed the triage vital signs and the nursing notes.                                Differential diagnosis includes, but is not limited to, nasal fracture, facial fractures, blunt trauma leading to epistaxis.  Patient's presentation is most consistent with acute presentation with potential threat to life or bodily function.  Labs/studies ordered: CT head, CT maxillofacial  Interventions/Medications given:  Medications  oxymetazoline (AFRIN) 0.05 % nasal spray 1 spray (1 spray Each Nare Given by Other 09/25/23 9373)  tranexamic acid (CYKLOKAPRON) 1000 MG/10ML topical solution 500 mg (500 mg Topical Given by Other 09/25/23 0740)  lidocaine  (XYLOCAINE ) 2 % viscous mouth solution 15 mL (15 mLs Mouth/Throat Given by Other 09/25/23 0741)  oxyCODONE -acetaminophen  (PERCOCET/ROXICET) 5-325 MG per tablet 2 tablet (2 tablets Oral Given 09/25/23 0747)  cephALEXin  (KEFLEX ) capsule 500 mg (500 mg Oral Given 09/25/23 0748)    (Note:  hospital course my include additional interventions and/or labs/studies not listed above.)   Given the high probability the patient will need  Merisel or rapid Rhino placement, I am obtaining maxillofacial CT to rule out fractures that would contraindicate placement of indwelling devices.  He has no tenderness to palpation of the cervical spine and no pain with flexion, extension, or rotation of the head and neck from side-to-side.  I can rule out cervical spine injury through Nexus criteria given that he is not obtunded.  Obtaining head CT to rule out intracranial hemorrhage given his anticoagulation status.  Prior to going to CT scan, I  administered Afrin nasal spray to bilateral nares and placed a nasal clamp in place.   Clinical Course as of 09/25/23 0750  Thu Sep 25, 2023  0746 Patient tolerated the initial Merisel on the right well and it stopped the bleeding.  Unfortunately he continued to have some bleeding from the left naris so I placed a second Merisel in the left naris.  I am transferring ED care to Dr. Dicky to follow-up on the patient's CBC to see if there is any evidence of substantial blood loss and we will also have a short period of observation to make sure the patient has tolerated Meris as well.  He and his wife understand he will need to follow-up with ENT. [CF]    Clinical Course User Index [CF] Gordan Huxley, MD     FINAL CLINICAL IMPRESSION(S) / ED DIAGNOSES   Final diagnoses:  Epistaxis due to trauma     Rx / DC Orders   ED Discharge Orders          Ordered    oxyCODONE -acetaminophen  (PERCOCET) 5-325 MG tablet  Every 6 hours PRN        09/25/23 0749    cefadroxil (DURICEF) 500 MG capsule  2 times daily        09/25/23 0749             Note:  This document was prepared using Dragon voice recognition software and may include unintentional dictation errors.   Gordan Huxley, MD 09/25/23 [phone]

## 2023-09-25 NOTE — ED Provider Notes (Signed)
 Patient seen evaluated by ENT.  Advise no active epistaxis, but due to bilateral nasal packing recommends admission and will be reevaluated by ENT again later today and observed.  Patient agreeable with plan.  Admitted to Dr. Luz Dicky Anes, MD 09/25/23 1136

## 2023-09-25 NOTE — ED Notes (Signed)
 Pt a little more calm. Pt to be taken upstairs to ready bed. Pt made aware that MD messaged about him having a panic attack. Explained that MD has not response yet and to follow up with his nurse upstairs about further orders.

## 2023-09-25 NOTE — ED Notes (Signed)
 Patient transported to CT

## 2023-09-25 NOTE — Consult Note (Signed)
 [name], [name] [id number] March 08, 1961 Dicky Anes, MD  Reason for Consult: epistaxis  HPI: 63 year old male with significant medical co-morbidities presented to ER this morning following a fall about 12 hours ago.  He reports he tripped on a step and fell on his face.  Since that time his nose has been bleeding.  Intially both sides and was packed by Dr. Gordan with what looks like 10cm Merocel packs.  His Hgb was found to be low and thought is acute on chronic anemia down to 8 from 11.5 several months ago.  Patient denies any significant pain currently.  Uses a full face CPAP at home for OSA.  He is on eliquis .  Allergies:  Allergies  Allergen Reactions   Iodinated Contrast Media Other (See Comments)    Kidney transplant  Iodinated contrast media (substance)   Iodine Other (See Comments)    Kidney transplant   Acetaminophen  Other (See Comments)    BC of transplant  acetaminophen    Nsaids Other (See Comments)    Kidney transplant   Ibuprofen Other (See Comments)    Due to kidney transplant    ROS: Review of systems normal other than 12 systems except per HPI.  PMH:  Past Medical History:  Diagnosis Date   Anal condyloma    Anemia    Aortic atherosclerosis (HCC)    Aortic stenosis    a.) TTE 10/2021: Mod AS. AVA 1.06cm^2 (VTI). Mean grad ; b.) TTE 04/02/2022: no AV stenosis   Asthma, persistent controlled 02/25/2013   Attention or concentration deficit 04/26/2021   BPH with obstruction/lower urinary tract symptoms 09/25/2014   CAD (coronary artery disease) 2005   a.) LHC 1996 -> HG stenosis mLAD -> PTCA/PCI with stent x 1 (unk type) -> complicated by CFA pseudoanurysm (required surg);  b.) LHC 08/10/2002  -> 95% LAD, 70% ISR LAD, 80% pOM, 70% mRCA --> CVTS consult; c.) 4v CABG 08/13/2002; c.) 03/2018 MV: Small, fixed inferoapical and apical sep defect. No isc. EF 47% (60-65% by 05/2018 TTE); d.) 02/2021 MV: small Apical inf fixed defect. No isc -> low risk.   Cardiomegaly     Cellulitis and abscess of left leg 07/22/2020   Chronic atrial fibrillation    a.) CHA2DS2VASc = 4 (CHF, HTN, vascular disease history, T2DM);  b.) rate/rhythm maintained without pharmacological intervention; chronically anticoagulated with apixaban    Chronic combined systolic and diastolic CHF (congestive heart failure)    a.) TTE 7/18 : EF 45-50%; b.) TTE 05/2018 : EF 60-65%, RVSP 74.8; c.) TTE 12/2018: EF 50-55%. Sev dil LA; d.) TTE 04/2019: EF 45-50%; e.) TTE 07/2021: EF 40-45%, G3DD; f.) TTE 10/2021: EF 40-45%, mild conc LVH, septal-lat dyssynchrony (LBBB), mildly red RVSF, mild-mod dil LA, mildly dil RA, mild-mod MS, mod AS; g.) TTE 04/02/2022: EF 40-45%, glob HK, LVH, red RVSF, RVE, sev LAE, mild-mod RAE, mild MR   Chronic venous stasis dermatitis of both lower extremities    Cytomegaloviral disease 2017   Diverticulosis of colon 04/22/2013   Dyspnea    Elevated RIGHT hemidiaphragm    Erectile dysfunction    a.) on topical TRT + Trimix (alprostadil/papaverine/phentolamine) injections   ESRD (end stage renal disease) (HCC)    a.) dialysis dependent 2004-2012; b.) s/p cadaveric RIGHT renal transplant 09/13/2010   Essential hypertension 12/17/2010   GERD (gastroesophageal reflux disease)    Gout    History of 2019 novel coronavirus disease (COVID-19) 06/30/2019   History of bilateral cataract extraction 10/2017   History of renal  transplant 09/13/2010   a.) s/p cadaveric donor transplant 09/13/2010   Hx of bilateral cataract extraction 10/2017   Hyperlipidemia    Hypogonadism in male 09/25/2014   Ischemic cardiomyopathy    Left cephalic vein thrombosis 06/2019   Long term current use of anticoagulant    a.) apixaban    Long term current use of immunosuppressive drug    a.) mycophenolate  + prednisone  + tacrolimus    Major depressive disorder 10/04/2013   Mitral stenosis 11/07/2021   a.) TTE 11/07/2021: severe MAC, mild-mod MS (MPG 6 mmHg); b.) TTE 04/02/2022: no MV stenosis   Murmur     Obesity hypoventilation syndrome 03/31/2012   OSA treated with BiPAP 09/29/2012   PAH (pulmonary artery hypertension)    a. 04/2019 RHC: RA 19, RV 80/20, PA 78/31 (51), PCWP 25, CO/CI 7.69/3.26. PVR 3.25 --> Sev PAH, likely primarily PV HTN; b.) RHC 04/04/2022: mRA 13, mPA 40, mPCWP 20, PA sat 59, AO sat 92, CO 7.63, CI 3.26, PVR 2.6 --> mod portal venous HTN   Pancreatitis    S/P CABG x 4 08/13/2002   a.) LIMA-LAD, LRA-OM, SVG-D1, SVG-dRCA   S/P gastric bypass    Synovitis of finger 06/11/2019   Trigger finger, unspecified little finger 12/23/2018   Type 2 diabetes mellitus with hyperglycemia, with long-term current use of insulin  09/09/2014   a.) has FreeStyle Libre CGM   Ulcer    Left shin goes to wound care center at [facility]   Wears glasses    Wears hearing aid in both ears     FH:  Family History  Problem Relation Age of Onset   Heart disease Father    Kidney failure Father    Kidney disease Father    Diabetes Maternal Grandmother    Breast cancer Maternal Grandmother    Valvular heart disease Mother    Liver cancer Paternal Uncle    Liver cancer Paternal Grandmother    Prostate cancer Neg Hx     SH:  Social History   Socioeconomic History   Marital status: Married    Spouse name: Not on file   Number of children: 2   Years of education: 14   Highest education level: Associate degree: academic program  Occupational History   Occupation: Presenter, broadcasting: unemployed    Comment: disabled due to kidney failure  Tobacco Use   Smoking status: Former    Types: Cigars    Quit date: 09/02/1994    Years since quitting: 29.0    Passive exposure: Never   Smokeless tobacco: Never  Vaping Use   Vaping status: Never Used  Substance and Sexual Activity   Alcohol use: Not Currently    Comment: occ   Drug use: No   Sexual activity: Not on file  Other Topics Concern   Not on file  Social History Narrative   Has living will   Wife is  health care POA---son Donnice is alternate   Would accept resuscitation attempts   No tube feedings if cognitively unaware   Social Drivers of Corporate investment banker Strain: Not on file  Food Insecurity: Low Risk  (06/25/2023)   Received from [facility]   Hunger Vital Sign    Within the past 12 months, you worried that your food would run out before you got money to buy more: Never true    Within the past 12 months, the food you bought just didn't last and you didn't have money to  get more. : Never true  Transportation Needs: No Transportation Needs (06/25/2023)   Received from [facility]   Transportation    In the past 12 months, has lack of reliable transportation kept you from medical appointments, meetings, work or from getting things needed for daily living? : No  Physical Activity: Not on file  Stress: Not on file  Social Connections: Socially Integrated (04/11/2023)   Social Connection and Isolation Panel    Frequency of Communication with Friends and Family: More than three times a week    Frequency of Social Gatherings with Friends and Family: More than three times a week    Attends Religious Services: More than 4 times per year    Active Member of [employer] or Organizations: Yes    Attends Banker Meetings: More than 4 times per year    Marital Status: Married  Catering manager Violence: Not At Risk (04/11/2023)   Humiliation, Afraid, Rape, and Kick questionnaire    Fear of Current or Ex-Partner: No    Emotionally Abused: No    Physically Abused: No    Sexually Abused: No    PSH:  Past Surgical History:  Procedure Laterality Date   CARDIAC CATHETERIZATION N/A 08/10/2002   CATARACT EXTRACTION W/PHACO Right 10/29/2017   Procedure: CATARACT EXTRACTION PHACO AND INTRAOCULAR LENS PLACEMENT (IOC)  RIGHT DIABETIC;  Surgeon: Mittie Gaskin, MD;  Location: [facility];  Service: Ophthalmology;  Laterality: Right   CATARACT EXTRACTION W/PHACO Left  11/18/2017   Procedure: CATARACT EXTRACTION PHACO AND INTRAOCULAR LENS PLACEMENT (IOC) LEFT IVA/TOPICAL;  Surgeon: Mittie Gaskin, MD;  Location: [facility];  Service: Ophthalmology;  Laterality: Left   COLONOSCOPY WITH PROPOFOL  N/A 04/22/2013   Procedure: COLONOSCOPY WITH PROPOFOL ;  Surgeon: Toribio SHAUNNA Cedar, MD;  Location: [facility];  Service: Endoscopy;  Laterality: N/A;   CORONARY ANGIOPLASTY WITH STENT PLACEMENT N/A 1996   CORONARY ARTERY BYPASS GRAFT N/A 08/13/2002   Procedure: 4v CORONARY ARTERY BYPASS GRAFTING; Location: [facility]; Surgeon: Dessa Laine, MD   CYSTOSCOPY WITH INSERTION OF UROLIFT N/A 12/25/2021   Procedure: CYSTOSCOPY WITH INSERTION OF UROLIFT;  Surgeon: Twylla Glendia BROCKS, MD;  Location: [facility];  Service: Urology;  Laterality: N/A;   DG ANGIO AV SHUNT*L*     right and left upper arms   FASCIOTOMY  03/03/2012   Procedure: FASCIOTOMY;  Surgeon: Arley JONELLE Curia, MD;  Location: [facility];  Service: Orthopedics;  Laterality: Right;  FASCIOTOMY RIGHT SMALL FINGER   FASCIOTOMY Left 08/17/2013   Procedure: FASCIOTOMY LEFT RING;  Surgeon: Arley JONELLE Curia, MD;  Location: [facility];  Service: Orthopedics;  Laterality: Left;   INCISION AND DRAINAGE ABSCESS Left 10/15/2015   Procedure: INCISION AND DRAINAGE ABSCESS;  Surgeon: Laneta JULIANNA Luna, MD;  Location: [facility];  Service: General;  Laterality: Left;   KIDNEY TRANSPLANT  09/13/2010   cadaver--at [facility]   LASER ABLATION CONDOLAMATA N/A 01/08/2023   Procedure: LASER REMOVAL ABLATION OF CONDYLOMATA;  Surgeon: Sheldon Standing, MD;  Location: [facility];  Service: General;  Laterality: N/A;  GEN AND LOCAL   RECTAL EXAM UNDER ANESTHESIA N/A 01/08/2023   Procedure: ANORECTAL EXAM UNDER ANESTHESIA;  Surgeon: Sheldon Standing, MD;  Location: [facility];  Service: General;  Laterality: N/A;   RIGHT HEART CATH N/A 11/15/2016   Procedure: RIGHT HEART CATH;  Surgeon: Darron Deatrice LABOR, MD;  Location: [facility]  INVASIVE CV LAB;  Service: Cardiovascular;  Laterality: N/A;   RIGHT HEART CATH N/A 05/03/2019  Procedure: RIGHT HEART CATH;  Surgeon: [name], MD;  [facility] INV[facility] CV LAB;  Service: Cardiovascular;  Laterality: N/A;   RIGHT HEART CATH N/A 04/04/2022   Procedure: RIGHT HEART CATH;  Surgeon: [name], MD;  Location: [facility] INVASIVE CV LAB;  Service: Cardiovascular;  Laterality: N/A;   ROUX-EN-Y GASTRIC BYPASS N/A 2015   TYMPANIC MEMBRANE REPAIR Left 03/2010   VASECTOMY     WART FULGURATION N/A 05/31/2022   Procedure: FULGURATION ANAL WART;  Surgeon: Tye Millet, DO;  Location: [facility];  Service: General;  Laterality: N/A;    Physical  Exam:  GEN-  NAD, sitting upright in chair EARS- external ears clear NOSE-  Bilateral AP packed with merocel sponges, right red left white.  No active bleeding or dipping from either side NEURO-  CN 2-12 grossly intact and symmetric. OC/OP-  old blood clot in posterior oropharynx, small.  No active bleeding NECK- supple with no LAD RESP- unlabored  CT-  IMPRESSION: 1. Mild motion artifact. No acute facial fracture identified. 2. Opacified posterior nasal cavity, nasopharynx. Small volume hemorrhage layering in the maxillary and right sphenoid sinuses. No acute nasal fracture identified.  A/P: Epistaxis with acute anemia with significant cardiac history and medical co-morbidities  Plan:  Discussed with patient and wife.  After extensive discussion, decision made observe given stopped bleeding but admit for duration of packing (5 days) given bilateral packing and OSA and anemia/co-morbidities.  Some light dripping/spotting of blood from right side is anticipated intermittently.  Recommend treatment for staph coverage with PCN or similar antibiotic during duration of indwelling foreign bodies.  Recommend Afrin TID x 72 hours to packs and Saline spray to packs bilaterally TID as well.  Will follow up later today.    Saahil Herbster 09/25/2023 12:01 PM

## 2023-09-25 NOTE — ED Triage Notes (Signed)
 Pt reports intermittent nose bleed since 8pm last night, pt takes eliquis . Pt reports nosebleed began after falling on his face, pt denies headache or LOC.

## 2023-09-26 ENCOUNTER — Inpatient Hospital Stay
Admit: 2023-09-26 | Discharge: 2023-09-26 | Disposition: A | Discharge status: Home / Self Care | Attending: Internal Medicine | Admitting: Internal Medicine

## 2023-09-26 ENCOUNTER — Inpatient Hospital Stay

## 2023-09-26 DIAGNOSIS — R464 Slowness and poor responsiveness: Secondary | ICD-10-CM

## 2023-09-26 DIAGNOSIS — R040 Epistaxis: Secondary | ICD-10-CM

## 2023-09-26 DIAGNOSIS — W19XXXA Unspecified fall, initial encounter: Secondary | ICD-10-CM

## 2023-09-26 LAB — COMPREHENSIVE METABOLIC PANEL WITH GFR
ALT: 17 U/L (ref 0–44)
AST: 19 U/L (ref 15–41)
Albumin: 3.1 g/dL — ABNORMAL LOW (ref 3.5–5.0)
Alkaline Phosphatase: 162 U/L — ABNORMAL HIGH (ref 38–126)
Anion gap: 12 (ref 5–15)
BUN: 117 mg/dL — ABNORMAL HIGH (ref 8–23)
CO2: 30 mmol/L (ref 22–32)
Calcium: 8.8 mg/dL — ABNORMAL LOW (ref 8.9–10.3)
Chloride: 100 mmol/L (ref 98–111)
Creatinine, Ser: 3.78 mg/dL — ABNORMAL HIGH (ref 0.61–1.24)
GFR, Estimated: 17 mL/min — ABNORMAL LOW (ref >60)
Glucose, Bld: 116 mg/dL — ABNORMAL HIGH (ref 70–99)
Potassium: 3.6 mmol/L (ref 3.5–5.1)
Sodium: 142 mmol/L (ref 135–145)
Total Bilirubin: 1.2 mg/dL (ref 0.0–1.2)
Total Protein: 6.3 g/dL — ABNORMAL LOW (ref 6.5–8.1)

## 2023-09-26 LAB — CBC
HCT: 24.8 % — ABNORMAL LOW (ref 39.0–52.0)
Hemoglobin: 7.5 g/dL — ABNORMAL LOW (ref 13.0–17.0)
MCH: 28.6 pg (ref 26.0–34.0)
MCHC: 30.2 g/dL (ref 30.0–36.0)
MCV: 94.7 fL (ref 80.0–100.0)
Platelets: 118 K/uL — ABNORMAL LOW (ref 150–400)
RBC: 2.62 MIL/uL — ABNORMAL LOW (ref 4.22–5.81)
RDW: 17.2 % — ABNORMAL HIGH (ref 11.5–15.5)
WBC: 3 K/uL — ABNORMAL LOW (ref 4.0–10.5)
nRBC: 0 % (ref 0.0–0.2)

## 2023-09-26 LAB — BLOOD GAS, ARTERIAL
Acid-Base Excess: 7.5 mmol/L — ABNORMAL HIGH (ref 0.0–2.0)
Bicarbonate: 32.6 mmol/L — ABNORMAL HIGH (ref 20.0–28.0)
O2 Content: 21 L/min
O2 Saturation: 93.3 %
Patient temperature: 37
pCO2 arterial: 48 mmHg (ref 32–48)
pH, Arterial: 7.44 (ref 7.35–7.45)
pO2, Arterial: 63 mmHg — ABNORMAL LOW (ref 83–108)

## 2023-09-26 LAB — MAGNESIUM: Magnesium: 2.4 mg/dL (ref 1.7–2.4)

## 2023-09-26 MED ORDER — AMOXICILLIN-POT CLAVULANATE 500-125 MG PO TABS
1.0000 | ORAL_TABLET | Freq: Two times a day (BID) | ORAL | Status: DC
  Administered 2023-09-26 – 2023-09-29 (×7): 1 via ORAL
  Filled 2023-09-26 (×8): qty 1

## 2023-09-26 MED ORDER — ACETAMINOPHEN 325 MG PO TABS
650.0000 mg | ORAL_TABLET | Freq: Once | ORAL | Status: AC
  Administered 2023-09-26: 650 mg via ORAL
  Filled 2023-09-26: qty 2

## 2023-09-26 MED ORDER — ALPRAZOLAM 0.5 MG PO TABS
0.5000 mg | ORAL_TABLET | Freq: Once | ORAL | Status: AC
  Administered 2023-09-26: 0.5 mg via ORAL
  Filled 2023-09-26: qty 1

## 2023-09-26 MED ORDER — SILVER SULFADIAZINE 1 % EX CREA
TOPICAL_CREAM | Freq: Every day | CUTANEOUS | Status: DC
  Filled 2023-09-26: qty 85

## 2023-09-26 NOTE — Progress Notes (Signed)
 RN called to room by CNA due to patient having complaints of not being able to open his eyes. The patient was found in the chair, eyes closed, and not responsive to voice or sternal rubs. A rapid response was called due to the change in status.His LKN was 0715 upon bedside shift report, and at that time the patient was seen moving his extremities and was able to carry on a conversation. A code stroke was also called per provider instruction, and orders were placed for transfer to another unit after his head CT. Report was called to April, RN.

## 2023-09-26 NOTE — Progress Notes (Signed)
   09/26/23 1045  Spiritual Encounters  Type of Visit Follow up  Care provided to: Pt and family  Conversation partners present during encounter Nurse  Reason for visit Routine spiritual support  OnCall Visit Yes   Chaplain followed up with patient and spouse to see if any additional spiritual care was needed and spouse said they were fine.  Chaplain celebrated with spouse that patient was speaking and looked like he'd had some improvement.    Rev. Rana M. Nicholaus, M.Div. Chaplain Resident [facility]

## 2023-09-26 NOTE — Evaluation (Signed)
 Clinical/Bedside Swallow Evaluation Patient Details  Name: [name]. MRN: [id number] Date of Birth: Jul 08, 1960  Today's Date: 09/26/2023 Time: SLP Start Time (ACUTE ONLY): 1055 SLP Stop Time (ACUTE ONLY): 1135 SLP Time Calculation (min) (ACUTE ONLY): 40 min  Past Medical History:  Past Medical History:  Diagnosis Date   Anal condyloma    Anemia    Aortic atherosclerosis (HCC)    Aortic stenosis    a.) TTE 10/2021: Mod AS. AVA 1.06cm^2 (VTI). Mean grad ; b.) TTE 04/02/2022: no AV stenosis   Asthma, persistent controlled 02/25/2013   Attention or concentration deficit 04/26/2021   BPH with obstruction/lower urinary tract symptoms 09/25/2014   CAD (coronary artery disease) 2005   a.) LHC 1996 -> HG stenosis mLAD -> PTCA/PCI with stent x 1 (unk type) -> complicated by CFA pseudoanurysm (required surg);  b.) LHC 08/10/2002  -> 95% LAD, 70% ISR LAD, 80% pOM, 70% mRCA --> CVTS consult; c.) 4v CABG 08/13/2002; c.) 03/2018 MV: Small, fixed inferoapical and apical sep defect. No isc. EF 47% (60-65% by 05/2018 TTE); d.) 02/2021 MV: small Apical inf fixed defect. No isc -> low risk.   Cardiomegaly    Cellulitis and abscess of left leg 07/22/2020   Chronic atrial fibrillation    a.) CHA2DS2VASc = 4 (CHF, HTN, vascular disease history, T2DM);  b.) rate/rhythm maintained without pharmacological intervention; chronically anticoagulated with apixaban    Chronic combined systolic and diastolic CHF (congestive heart failure)    a.) TTE 7/18 : EF 45-50%; b.) TTE 05/2018 : EF 60-65%, RVSP 74.8; c.) TTE 12/2018: EF 50-55%. Sev dil LA; d.) TTE 04/2019: EF 45-50%; e.) TTE 07/2021: EF 40-45%, G3DD; f.) TTE 10/2021: EF 40-45%, mild conc LVH, septal-lat dyssynchrony (LBBB), mildly red RVSF, mild-mod dil LA, mildly dil RA, mild-mod MS, mod AS; g.) TTE 04/02/2022: EF 40-45%, glob HK, LVH, red RVSF, RVE, sev LAE, mild-mod RAE, mild MR   Chronic venous stasis dermatitis of both lower extremities     Cytomegaloviral disease 2017   Diverticulosis of colon 04/22/2013   Dyspnea    Elevated RIGHT hemidiaphragm    Erectile dysfunction    a.) on topical TRT + Trimix (alprostadil/papaverine/phentolamine) injections   ESRD (end stage renal disease) (HCC)    a.) dialysis dependent 2004-2012; b.) s/p cadaveric RIGHT renal transplant 09/13/2010   Essential hypertension 12/17/2010   GERD (gastroesophageal reflux disease)    Gout    History of 2019 novel coronavirus disease (COVID-19) 06/30/2019   History of bilateral cataract extraction 10/2017   History of renal transplant 09/13/2010   a.) s/p cadaveric donor transplant 09/13/2010   Hx of bilateral cataract extraction 10/2017   Hyperlipidemia    Hypogonadism in male 09/25/2014   Ischemic cardiomyopathy    Left cephalic vein thrombosis 06/2019   Long term current use of anticoagulant    a.) apixaban    Long term current use of immunosuppressive drug    a.) mycophenolate  + prednisone  + tacrolimus    Major depressive disorder 10/04/2013   Mitral stenosis 11/07/2021   a.) TTE 11/07/2021: severe MAC, mild-mod MS (MPG 6 mmHg); b.) TTE 04/02/2022: no MV stenosis   Murmur    Obesity hypoventilation syndrome 03/31/2012   OSA treated with BiPAP 09/29/2012   PAH (pulmonary artery hypertension)    a. 04/2019 RHC: RA 19, RV 80/20, PA 78/31 (51), PCWP 25, CO/CI 7.69/3.26. PVR 3.25 --> Sev PAH, likely primarily PV HTN; b.) RHC 04/04/2022: mRA 13, mPA 40, mPCWP 20, PA sat 59, AO sat  92, CO 7.63, CI 3.26, PVR 2.6 --> mod portal venous HTN   Pancreatitis    S/P CABG x 4 08/13/2002   a.) LIMA-LAD, LRA-OM, SVG-D1, SVG-dRCA   S/P gastric bypass    Synovitis of finger 06/11/2019   Trigger finger, unspecified little finger 12/23/2018   Type 2 diabetes mellitus with hyperglycemia, with long-term current use of insulin  09/09/2014   a.) has FreeStyle Libre CGM   Ulcer    Left shin goes to wound care center at [facility] qweek   Wears glasses    Wears hearing  aid in both ears    Past Surgical History:  Past Surgical History:  Procedure Laterality Date   CARDIAC CATHETERIZATION N/A 08/10/2002   CATARACT EXTRACTION W/PHACO Right 10/29/2017   Procedure: CATARACT EXTRACTION PHACO AND INTRAOCULAR LENS PLACEMENT (IOC)  RIGHT DIABETIC;  Surgeon: Mittie Gaskin, MD;  Location: [facility];  Service: Ophthalmology;  Laterality: Right   CATARACT EXTRACTION W/PHACO Left 11/18/2017   Procedure: CATARACT EXTRACTION PHACO AND INTRAOCULAR LENS PLACEMENT (IOC) LEFT IVA/TOPICAL;  Surgeon: Mittie Gaskin, MD;  Location: [facility];  Service: Ophthalmology;  Laterality: Left   COLONOSCOPY WITH PROPOFOL  N/A 04/22/2013   Procedure: COLONOSCOPY WITH PROPOFOL ;  Surgeon: Toribio SHAUNNA Cedar, MD;  Location: [facility];  Service: Endoscopy;  Laterality: N/A;   CORONARY ANGIOPLASTY WITH STENT PLACEMENT N/A 1996   CORONARY ARTERY BYPASS GRAFT N/A 08/13/2002   Procedure: 4v CORONARY ARTERY BYPASS GRAFTING; Location: [facility]; Surgeon: Dessa Laine, MD   CYSTOSCOPY WITH INSERTION OF UROLIFT N/A 12/25/2021   Procedure: CYSTOSCOPY WITH INSERTION OF UROLIFT;  Surgeon: Twylla Glendia BROCKS, MD;  Location: [facility];  Service: Urology;  Laterality: N/A;   DG ANGIO AV SHUNT*L*     right and left upper arms   FASCIOTOMY  03/03/2012   Procedure: FASCIOTOMY;  Surgeon: Arley JONELLE Curia, MD;  Location: [facility];  Service: Orthopedics;  Laterality: Right;  FASCIOTOMY RIGHT SMALL FINGER   FASCIOTOMY Left 08/17/2013   Procedure: FASCIOTOMY LEFT RING;  Surgeon: Arley JONELLE Curia, MD;  Location:  SURGERY CENTER;  Service: Orthopedics;  Laterality: Left;   INCISION AND DRAINAGE ABSCESS Left 10/15/2015   Procedure: INCISION AND DRAINAGE ABSCESS;  Surgeon: Laneta JULIANNA Luna, MD;  Location: [facility];  Service: General;  Laterality: Left;   KIDNEY TRANSPLANT  09/13/2010   cadaver--at [facility]   LASER ABLATION CONDOLAMATA N/A 01/08/2023   Procedure: LASER  REMOVAL ABLATION OF CONDYLOMATA;  Surgeon: Sheldon Standing, MD;  Location: [facility];  Service: General;  Laterality: N/A;  GEN AND LOCAL   RECTAL EXAM UNDER ANESTHESIA N/A 01/08/2023   Procedure: ANORECTAL EXAM UNDER ANESTHESIA;  Surgeon: Sheldon Standing, MD;  Location: [facility];  Service: General;  Laterality: N/A;   RIGHT HEART CATH N/A 11/15/2016   Procedure: RIGHT HEART CATH;  Surgeon: Darron Deatrice LABOR, MD;  Location: [facility];  Service: Cardiovascular;  Laterality: N/A;   RIGHT HEART CATH N/A 05/03/2019   Procedure: RIGHT HEART CATH;  Surgeon: [name], MD;  Location: [facility]AS[facility] CV [facility];  Service: Cardiovascular;  Laterality: N/A;   RIGHT HEART CATH N/A 04/04/2022   Procedure: RIGHT HEART CATH;  Surgeon: [name], MD;  Location: [facility] IN[facility]AS[facility] CV LAB;  Service: Cardiovascular;  Laterality: N/A;   ROUX-EN-Y GASTRIC BYPASS N/A 2015   TYMPANIC MEMBRANE REPAIR Left 03/2010   VASECTOMY     WART FULGURATION N/A 05/31/2022   Procedure: FULGURATION ANAL WART;  Surgeon: Tye Millet, DO;  Location: [facility];  Service: General;  Laterality: N/A;   HPI:  Pt is a 63 y.o. male  who presents to the hospital due to nosebleed s/p fall.  pt has a recent shoulder fracture on the left side so he was not able to catch himself. He did not lose consciousness. He fell on his deck and landed on his face. Of note he is on anti-coagulation with Eliquis  for afib. The ER doctor was dealing with persistent bleeding from the nose and as a result Dr. Milissa from ENT was consulted and saw the pt in the ER. Pt currently has packing in place and Dr. Milissa requested that the pt be placed in the hospital due to ongoing anemia and requirement for nasal packing.  PMH includes multiple medical dxs including Obesity w/ gastric bypass, attenton deficit, anemia, CHF, DM, HTN, GERD, CAD, HLD, Renal transplant recipient (2012).   MRI today: Age-related change without acute intracranial pathology.    Assessment / Plan /  Recommendation  Clinical Impression   Pt seen for BSE this morning. pt awake, verbal. Noted nasal packing s/p nasal bleeding earlier this morning. A rapid response was called at ~8:30 when pt's responsiveness changed s/p attempting to stand up w/ Wife at the bedside. Also noted was that pt has not been able to wear his CPAP d/t the nasal bleeding and packing(by ENT).   Pt appears to present w/ grossly adequate oropharyngeal phase swallow w/ No oropharyngeal phase dysphagia noted, No neuromuscular deficits noted. Pt consumed po trials w/ No immediate, overt, clinical s/s of aspiration during po trials. Pt appears at reduced risk for aspiration following general aspiration precautions.  Recommend a fairly Regular consistency diet w/ well-Cut meats, moistened foods; Thin liquids -- pt should help to Hold Cup when drinking. Recommend general aspiration precautions, sit fully upright for oral intake. REFLUX/GERD precautions. Pills 1-2 at a time or WHOLE in Puree for easier swallowing.  Education given on Pills in Puree; food consistencies and easy to eat options; general aspiration precautions to pt and Wife present. NSG to reconsult if any new needs arise. MD/NSG updated, agreed. MD to place diet order. Recommend Dietician f/u for support. Precautions posted in room, chart. SLP Visit Diagnosis: Dysphagia, unspecified (R13.10) (impact of nasal packing)    Aspiration Risk   (reduced following general precs.)    Diet Recommendation   Thin;Age appropriate regular = a fairly Regular consistency diet w/ well-Cut meats, moistened foods; Thin liquids -- pt should help to Hold Cup when drinking. Recommend general aspiration precautions, sit fully upright for oral intake. REFLUX/GERD precautions.   Medication Administration: Whole meds with liquid (1-2 at a time, or w/ a puree)    Other  Recommendations Recommended Consults:  (Dietician- gastric bypass in past) Oral Care Recommendations: Oral care BID;Patient  independent with oral care     Assistance Recommended at Discharge  Intermittent for support  Functional Status Assessment Patient has not had a recent decline in their functional status  Frequency and Duration  (n/a)   (n/a)       Prognosis Prognosis for improved oropharyngeal function: Good Barriers/Prognosis Comment: nasal packing      Swallow Study   General Date of Onset: 09/25/23 HPI: Pt is a 63 y.o. male  who presents to the hospital due to nosebleed s/p fall.  pt has a recent shoulder fracture on the left side so he was not able to catch himself. He did not lose consciousness. He fell on his deck and  landed on his face. Of note he is on anti-coagulation with Eliquis  for afib. The ER doctor was dealing with persistent bleeding from the nose and as a result Dr. Milissa from ENT was consulted and saw the pt in the ER. Pt currently has packing in place and Dr. Milissa requested that the pt be placed in the hospital due to ongoing anemia and requirement for nasal packing.  PMH includes multiple medical dxs including Obesity w/ gastric bypass, attenton deficit, anemia, CHF, DM, HTN, GERD, CAD, HLD, Renal transplant recipient (2012).   MRI today: Age-related change without acute intracranial pathology. Type of Study: Bedside Swallow Evaluation Previous Swallow Assessment: none Diet Prior to this Study: NPO Temperature Spikes Noted: No (wbc 3.0) Respiratory Status: Room air History of Recent Intubation: No Behavior/Cognition: Alert;Cooperative;Pleasant mood Oral Cavity Assessment: Within Functional Limits Oral Care Completed by SLP: Recent completion by staff Oral Cavity - Dentition: Adequate natural dentition Vision: Functional for self-feeding Self-Feeding Abilities: Able to feed self;Needs assist;Needs set up (shaky UEs) Patient Positioning: Upright in bed (needed support) Baseline Vocal Quality: Normal (min change in resonance d/t nasal packing) Volitional Cough: Strong Volitional  Swallow: Able to elicit    Oral/Motor/Sensory Function Overall Oral Motor/Sensory Function: Within functional limits   Ice Chips Ice chips: Within functional limits Presentation: Spoon (fed; 2 trials)   Thin Liquid Thin Liquid: Within functional limits Presentation: Cup;Self Fed;Straw (~6 ozs total) Other Comments: water , juice    Nectar Thick Nectar Thick Liquid: Not tested   Honey Thick Honey Thick Liquid: Not tested   Puree Puree: Within functional limits Presentation: Spoon (fed; 3 trials)   Solid     Solid: Within functional limits Presentation: Self Fed (2 trials) Other Comments: moistened         Comer Portugal, MS, CCC-SLP Speech Language Pathologist Rehab Services; [facility] [phone] (ascom) Khilee Hendricksen 09/26/2023,3:05 PM

## 2023-09-26 NOTE — Plan of Care (Signed)
  Problem: Education: Goal: Knowledge of General Education information will improve Description: Including pain rating scale, medication(s)/side effects and non-pharmacologic comfort measures Outcome: Progressing   Problem: Health Behavior/Discharge Planning: Goal: Ability to manage health-related needs will improve Outcome: Progressing   Problem: Nutrition: Goal: Adequate nutrition will be maintained Outcome: Progressing   Problem: Coping: Goal: Level of anxiety will decrease Outcome: Progressing   Problem: Pain Managment: Goal: General experience of comfort will improve and/or be controlled Outcome: Progressing   Problem: Safety: Goal: Ability to remain free from injury will improve Outcome: Progressing

## 2023-09-26 NOTE — Consult Note (Addendum)
 WOC consult requested for left leg.   Secure chat message sent as follows: Good morning, I am reaching out to request a photo of the leg wound on this patient, there is no WOC nurse on [facility] campus and we would be able to provide topical treatment recommendations remotely.  Consult performed remotely after review of the progress notes and photos in the EMR. Pt was seen in a Dr's office on 6/20 for evaluation of left leg full thickness stasis ulcer, it was reported to be 3 cm and Silvadene  was ordered at that time. Wound is 100% yellow and dry and agree with current plan of care.  Topical treatment orders provided for bedside nurses to perform as was the previous plan of care:  Apply Silvadene  to left leg Q day, then cover with gauze and tape or foam dressing.  Remove all previous Silvadene  with moist gauze before applying more each time.   Please re-consult if further assistance is needed.  Thank-you,  Stephane Fought MSN, RN, CWOCN, [place], CNS [phone]

## 2023-09-26 NOTE — Progress Notes (Signed)
 Progress Note   Patient: [name]. FMW:[id number] DOB: 18-Mar-1961 DOA: 09/25/2023     1 DOS: the patient was seen and examined on 09/26/2023   Brief hospital course: [name]. is a 63 y.o. male  who presents to the hospital due to nosebleed from a fall. He fell and landed on his face. Of note he is on anti-coagulation with eliquis  for afib. The ER doctor was dealing with persistent bleeding from the nose and as a result Dr. Milissa from ENT was consulted and saw the pt in the ER. Pt currently has packing in place and Dr. Milissa requested that the pt be placed in the hospital due to ongoing anemia and requirement for nasal packing.    07/04 -rapid response called due to change in mental status.  Patient seen and examined at the bedside noted to be minimally responsive but able to follow simple commands.  Has some right-sided weakness.  Code stroke called      Assessment and Plan:  Episode of unresponsiveness TIA rule out CVA Patient's last known well at about 7:15 AM and about 8:20 AM patient was found to be minimally responsive and only arousable to deep sternal rub.  He was later able to follow simple commands but noted to have some right-sided weakness.  Has a history of Bell's palsy which could explain his facial droop. Code stroke was called Not a candidate for tPA due to epistaxis requiring nasal packing Stat neurology consult Not on antiplatelets due to severe epistaxis Will obtain MRI of the brain to rule out an acute stroke Continue statins    ABLA due to Nose bleed  Patient admitted to the hospital for severe epistaxis in the setting of chronic anticoagulation use Appreciate ENT assistance.  Patient is status post nasal packing bilaterally Continue Afrin 3 times daily x 72 hours 2 packs and saline spray 2 puffs bilaterally 3 times daily Continue Augmentin  875 mg PO bid  Noted to have a drop in his H&H from 11.5 (1 month ago) to 7.5 Monitor H&H  closely and transfuse as needed     Chronic systolic CHF   Last known LVEF of 40 to 45% from a 2D echocardiogram which was done 12/24 Follow-up results of repeat 2D echocardiogram    CAD Continue statins   HTN Blood pressure is stable     Renal transplant recipient (2012)  Continue tacrolimus  and prednisone      BPH Continue Flomax  0.4 mg PO daily     DM2 with complications of stage IV chronic kidney disease status post renal transplant - Continue consistent carbohydrate diet   Pancytopenia Most likely related to immunosuppression Monitor closely    Depression Continue bupropion            Subjective: Patient is seen and examined at bedside.  Initially minimally responsive but now able to follow simple commands  Physical Exam: Vitals:   09/25/23 2029 09/26/23 0300 09/26/23 0813 09/26/23 0830  BP: 121/77 130/70 (!) 131/96 123/63  Pulse: 69 70 76 70  Resp: 18 18 18    Temp: 98.8 F (37.1 C) 98.6 F (37 C) 98.2 F (36.8 C)   TempSrc: Oral Oral Oral   SpO2: 96% 98% 95% 97%  Weight:      Height:       HENT:     Head: Normocephalic.     Nose:     Comments: B/l nasal packing  Cardiovascular:     Rate and Rhythm: Normal rate.  Pulmonary:     Effort: Pulmonary effort is normal.  Abdominal:     Palpations: Abdomen is soft.  Musculoskeletal:        General: Normal range of motion.  Skin:    General: Skin is warm.     Comments: 2+ pitting edema b/l lower ext's  Neurological:     Mental Status: Lethargic but arouses to loud verbal stimuli.  Able to follow simple commands Psychiatric:     Unable to assess    Data Reviewed: pH 7.44, PCO248, PO263, BUN 117, creatinine 3.78, white count 3.0, hemoglobin 7.7 Labs reviewed  Family Communication: Plan of care discussed with patient's wife at the bedside.  All questions and concerns have been addressed.  They verbalized understanding and agree with the plan  Disposition: Status is: Inpatient Remains  inpatient appropriate because: Workup for TIA/stroke, monitor H&H  Planned Discharge Destination: TBD    Time spent: 55 minutes  Author: Aimee Somerset, MD 09/26/2023 11:29 AM  For on call review [email or web address].

## 2023-09-26 NOTE — Progress Notes (Signed)
 PT Cancellation Note  Patient Details Name: [name]. MRN: [id number] DOB: Jun 01, 1960   Cancelled Treatment:    Reason Eval/Treat Not Completed: Patient not medically ready PT orders received, chart reviewed. Pt noted to have RRT & code stroke called this AM. No recent notes following issues this AM, so reached out to MD who is recommending holding PT evaluation at this time. Will f/u as able & as pt is medically stable.  Richerd Pinal, PT, DPT 09/26/23, 10:38 AM   [name] 09/26/2023, 10:37 AM

## 2023-09-26 NOTE — Progress Notes (Signed)
   09/26/23 0845  Spiritual Encounters  Type of Visit Initial  Care provided to: Pt and family  Conversation partners present during encounter Nurse  Reason for visit Code  OnCall Visit Yes   Chaplain received a RRT page which then changed to a Code Stroke.  Patient's wife at bedside so Chaplain offered a compassionate presence.  Patient went to CT and then was moved from 230 to 121.  Chaplain assisted spouse with gathering items and sharing fond memories of 30 years of marriage.  Family looking forward to a wedding next month so spouse shared patient was trying to get prepared for that.  Chaplain provided reflective listening as spouse shared joy about vacations she's had with the patient.  When Chaplain walked spouse to 1C, patient was being rolled to his new room from CT and Chaplain took her leave to allow the patient and spouse to have time together.    Rev. Rana M. Nicholaus, M.Div. Chaplain Resident  [facility]

## 2023-09-26 NOTE — Progress Notes (Signed)
 Rapid Response Event Note   Reason for Call : unresponsiveness/code stroke   Initial Focused Assessment: On my arrival pt is in chair, unresponsive. Pt eventually did open eyes to pain. Pt moved back into bed. Pt with eyes open and able to answer some simple questions and nod appropriately. Pt does have tremor. Pt has left arm in sling and packing in nose from nose bleed. Pt normally wears bipap at night but has been unable due to nasal packing. Pt's VS are stable at this time on room air. HR 70, BP 123/63. Pt's wife is at bedside. Pt able to move all extremities however, right side is extremely weak and pt states he cannot move.  Interventions: CBG checked and WNL 119. Dr. Lanetta at bedside assessing pt. Code stroke called. LKW according to primary RN was during shift report at 0715. RN states that pt could move all extremities and have conversation. Wife reports that pt was altered when she arrived. Pt was taken for STAT head CT. ABG was ordered. Dr. Matthews, neurologist, arrived in CT to assess pt. Per Dr. Matthews, she does not think pt has had stroke, and not a candidate for TNK d/t recent eliquis . Okay to transfer pt to 121.   Plan of Care: Pt taken from CT to room 121. Dr. Matthews updated wife at bedside. No NIH ordered per Dr. Matthews. Pt was able to move all extremities during last assessment. VSS. RN notified to call rapid response with any concerns.    Event Summary:   MD Notified: Dr. Lanetta Call Time: [phone] Arrival Time: 0829 End Time: 9089  Duwaine LOISE Cools, RN

## 2023-09-26 NOTE — Progress Notes (Signed)
  Echocardiogram 2D Echocardiogram has been performed.  [name] Louder 09/26/2023, 1:56 PM

## 2023-09-27 DIAGNOSIS — R040 Epistaxis: Secondary | ICD-10-CM | POA: Diagnosis not present

## 2023-09-27 LAB — ECHOCARDIOGRAM COMPLETE
Height: 70 in
S' Lateral: 4.3 cm
Weight: 3915.37 [oz_av]

## 2023-09-27 LAB — COMPREHENSIVE METABOLIC PANEL WITH GFR
ALT: 18 U/L (ref 0–44)
AST: 18 U/L (ref 15–41)
Albumin: 3 g/dL — ABNORMAL LOW (ref 3.5–5.0)
Alkaline Phosphatase: 170 U/L — ABNORMAL HIGH (ref 38–126)
Anion gap: 12 (ref 5–15)
BUN: 112 mg/dL — ABNORMAL HIGH (ref 8–23)
CO2: 30 mmol/L (ref 22–32)
Calcium: 8.7 mg/dL — ABNORMAL LOW (ref 8.9–10.3)
Chloride: 101 mmol/L (ref 98–111)
Creatinine, Ser: 3.86 mg/dL — ABNORMAL HIGH (ref 0.61–1.24)
GFR, Estimated: 17 mL/min — ABNORMAL LOW (ref >60)
Glucose, Bld: 138 mg/dL — ABNORMAL HIGH (ref 70–99)
Potassium: 3.5 mmol/L (ref 3.5–5.1)
Sodium: 143 mmol/L (ref 135–145)
Total Bilirubin: 0.9 mg/dL (ref 0.0–1.2)
Total Protein: 6.4 g/dL — ABNORMAL LOW (ref 6.5–8.1)

## 2023-09-27 LAB — CBC
HCT: 26.5 % — ABNORMAL LOW (ref 39.0–52.0)
Hemoglobin: 7.9 g/dL — ABNORMAL LOW (ref 13.0–17.0)
MCH: 28.8 pg (ref 26.0–34.0)
MCHC: 29.8 g/dL — ABNORMAL LOW (ref 30.0–36.0)
MCV: 96.7 fL (ref 80.0–100.0)
Platelets: 117 K/uL — ABNORMAL LOW (ref 150–400)
RBC: 2.74 MIL/uL — ABNORMAL LOW (ref 4.22–5.81)
RDW: 17.2 % — ABNORMAL HIGH (ref 11.5–15.5)
WBC: 3.4 K/uL — ABNORMAL LOW (ref 4.0–10.5)
nRBC: 0 % (ref 0.0–0.2)

## 2023-09-27 LAB — MAGNESIUM: Magnesium: 2.7 mg/dL — ABNORMAL HIGH (ref 1.7–2.4)

## 2023-09-27 MED ORDER — TORSEMIDE 20 MG PO TABS
80.0000 mg | ORAL_TABLET | Freq: Two times a day (BID) | ORAL | Status: DC
  Administered 2023-09-27 – 2023-09-29 (×5): 80 mg via ORAL
  Filled 2023-09-27 (×6): qty 4

## 2023-09-27 MED ORDER — POLYETHYLENE GLYCOL 3350 17 G PO PACK
17.0000 g | PACK | Freq: Every day | ORAL | Status: DC
  Administered 2023-09-27 – 2023-09-28 (×2): 17 g via ORAL
  Filled 2023-09-27 (×3): qty 1

## 2023-09-27 MED ORDER — SENNOSIDES-DOCUSATE SODIUM 8.6-50 MG PO TABS
2.0000 | ORAL_TABLET | Freq: Every day | ORAL | Status: DC
  Administered 2023-09-27 – 2023-09-28 (×2): 2 via ORAL
  Filled 2023-09-27 (×2): qty 2

## 2023-09-27 MED ORDER — POTASSIUM CHLORIDE CRYS ER 20 MEQ PO TBCR: 40.0000 meq | EXTENDED_RELEASE_TABLET | Freq: Once | ORAL | Status: DC

## 2023-09-27 MED ORDER — ACETAMINOPHEN 500 MG PO TABS
1000.0000 mg | ORAL_TABLET | Freq: Once | ORAL | Status: AC
  Administered 2023-09-27: 1000 mg via ORAL
  Filled 2023-09-27: qty 2

## 2023-09-27 MED ORDER — POTASSIUM CHLORIDE CRYS ER 20 MEQ PO TBCR
40.0000 meq | EXTENDED_RELEASE_TABLET | Freq: Every day | ORAL | Status: DC
  Administered 2023-09-27 – 2023-09-29 (×3): 40 meq via ORAL
  Filled 2023-09-27 (×3): qty 2

## 2023-09-27 NOTE — Progress Notes (Addendum)
 PT Cancellation Note  Patient Details Name: [name]. MRN: [id number] DOB: Feb 22, 1961  Addendum: re-attempted this pm, patient sleeping soundly. Will re-attempt tomorrow.    Cancelled Treatment:    Reason Eval/Treat Not Completed: Fatigue/lethargy limiting ability to participate Patient states he just ambulated around nursing station with wife and nurse. Will return after lunch for evaluation.  Camber Ninh 09/27/2023, 11:20 AM

## 2023-09-27 NOTE — Consult Note (Signed)
 NEUROLOGY CONSULT NOTE   Date of service: September 27, 2023 Patient Name: [name]. MRN:  [id number] DOB:  [birth year] Chief Complaint: stroke code Requesting Provider: Lanetta Lingo, MD  History of Present Illness  [name]. is a 63 y.o. male with hx of  a fib on eliquis  at home who is admitted with epistaxis following a fall. He had head CT on admission that showed no blood.intracranially. Stroke code was called this AM for decreased responsiveness with R sided weakness. He has a shoulder fracture on the L. On my exam, slow to respond but appropriate and oriented x3, nonfocal on strength and remainder of neurologic exam. Head CT showed no blood. TNK not administered 2/2 contraindications of eliquis  last dose yesterday, severe epistaxis, as well as nonfocal exam. CTA not performed 2/2 exam not c/w LVO and contrast allergy. LKW 0715, steadily improving. Wife states he has similar episodes at home, typically nonfocal.  LKW: 0715 Modified rankin score: 2-Slight disability-UNABLE to perform all activities but does not need assistance IV Thrombolysis: no see above  NIHSS components Score: Comment  1a Level of Conscious 0[x]  1[]  2[]  3[]      1b LOC Questions 0[x]  1[]  2[]       1c LOC Commands 0[x]  1[]  2[]       2 Best Gaze 0[x]  1[]  2[]       3 Visual 0[x]  1[]  2[]  3[]      4 Facial Palsy 0[x]  1[]  2[]  3[]      5a Motor Arm - left 0[]  1[]  2[]  3[]  4[]  UN[x]    5b Motor Arm - Right 0[]  1[]  2[x]  3[]  4[]  UN[]    6a Motor Leg - Left 0[]  1[]  2[x]  3[]  4[]  UN[]    6b Motor Leg - Right 0[]  1[]  2[x]  3[]  4[]  UN[]    7 Limb Ataxia 0[x]  1[]  2[]  UN[]      8 Sensory 0[x]  1[]  2[]  UN[]      9 Best Language 0[]  1[x]  2[]  3[]      10 Dysarthria 0[]  1[x]  2[]  UN[]      11 Extinct. and Inattention 0[x]  1[]  2[]       TOTAL:  8      ROS   Comprehensive ROS performed and pertinent positives documented in HPI   Past History   Past Medical History:  Diagnosis Date   Anal condyloma    Anemia     Aortic atherosclerosis (HCC)    Aortic stenosis    a.) TTE 10/2021: Mod AS. AVA 1.06cm^2 (VTI). Mean grad ; b.) TTE 04/02/2022: no AV stenosis   Asthma, persistent controlled 02/25/2013   Attention or concentration deficit 04/26/2021   BPH with obstruction/lower urinary tract symptoms 09/25/2014   CAD (coronary artery disease) 2005   a.) LHC 1996 -> HG stenosis mLAD -> PTCA/PCI with stent x 1 (unk type) -> complicated by CFA pseudoanurysm (required surg);  b.) LHC 08/10/2002  -> 95% LAD, 70% ISR LAD, 80% pOM, 70% mRCA --> CVTS consult; c.) 4v CABG 08/13/2002; c.) 03/2018 MV: Small, fixed inferoapical and apical sep defect. No isc. EF 47% (60-65% by 05/2018 TTE); d.) 02/2021 MV: small Apical inf fixed defect. No isc -> low risk.   Cardiomegaly    Cellulitis and abscess of left leg 07/22/2020   Chronic atrial fibrillation    a.) CHA2DS2VASc = 4 (CHF, HTN, vascular disease history, T2DM);  b.) rate/rhythm maintained without pharmacological intervention; chronically anticoagulated with apixaban    Chronic combined systolic and diastolic CHF (congestive heart failure)    a.) TTE 7/18 :  EF 45-50%; b.) TTE 05/2018 : EF 60-65%, RVSP 74.8; c.) TTE 12/2018: EF 50-55%. Sev dil LA; d.) TTE 04/2019: EF 45-50%; e.) TTE 07/2021: EF 40-45%, G3DD; f.) TTE 10/2021: EF 40-45%, mild conc LVH, septal-lat dyssynchrony (LBBB), mildly red RVSF, mild-mod dil LA, mildly dil RA, mild-mod MS, mod AS; g.) TTE 04/02/2022: EF 40-45%, glob HK, LVH, red RVSF, RVE, sev LAE, mild-mod RAE, mild MR   Chronic venous stasis dermatitis of both lower extremities    Cytomegaloviral disease 2017   Diverticulosis of colon 04/22/2013   Dyspnea    Elevated RIGHT hemidiaphragm    Erectile dysfunction    a.) on topical TRT + Trimix (alprostadil/papaverine/phentolamine) injections   ESRD (end stage renal disease) (HCC)    a.) dialysis dependent 2004-2012; b.) s/p cadaveric RIGHT renal transplant 09/13/2010   Essential hypertension 12/17/2010    GERD (gastroesophageal reflux disease)    Gout    History of 2019 novel coronavirus disease (COVID-19) 06/30/2019   History of bilateral cataract extraction 10/2017   History of renal transplant 09/13/2010   a.) s/p cadaveric donor transplant 09/13/2010   Hx of bilateral cataract extraction 10/2017   Hyperlipidemia    Hypogonadism in male 09/25/2014   Ischemic cardiomyopathy    Left cephalic vein thrombosis 06/2019   Long term current use of anticoagulant    a.) apixaban    Long term current use of immunosuppressive drug    a.) mycophenolate  + prednisone  + tacrolimus    Major depressive disorder 10/04/2013   Mitral stenosis 11/07/2021   a.) TTE 11/07/2021: severe MAC, mild-mod MS (MPG 6 mmHg); b.) TTE 04/02/2022: no MV stenosis   Murmur    Obesity hypoventilation syndrome 03/31/2012   OSA treated with BiPAP 09/29/2012   PAH (pulmonary artery hypertension)    a. 04/2019 RHC: RA 19, RV 80/20, PA 78/31 (51), PCWP 25, CO/CI 7.69/3.26. PVR 3.25 --> Sev PAH, likely primarily PV HTN; b.) RHC 04/04/2022: mRA 13, mPA 40, mPCWP 20, PA sat 59, AO sat 92, CO 7.63, CI 3.26, PVR 2.6 --> mod portal venous HTN   Pancreatitis    S/P CABG x 4 08/13/2002   a.) LIMA-LAD, LRA-OM, SVG-D1, SVG-dRCA   S/P gastric bypass    Synovitis of finger 06/11/2019   Trigger finger, unspecified little finger 12/23/2018   Type 2 diabetes mellitus with hyperglycemia, with long-term current use of insulin  09/09/2014   a.) has FreeStyle Libre CGM   Ulcer    Left shin goes to wound care center at [facility] qweek   Wears glasses    Wears hearing aid in both ears     Past Surgical History:  Procedure Laterality Date   CARDIAC CATHETERIZATION N/A 08/10/2002   CATARACT EXTRACTION W/PHACO Right 10/29/2017   Procedure: CATARACT EXTRACTION PHACO AND INTRAOCULAR LENS PLACEMENT (IOC)  RIGHT DIABETIC;  Surgeon: Mittie Gaskin, MD;  Location: [facility];  Service: Ophthalmology;  Laterality: Right   CATARACT  EXTRACTION W/PHACO Left 11/18/2017   Procedure: CATARACT EXTRACTION PHACO AND INTRAOCULAR LENS PLACEMENT (IOC) LEFT IVA/TOPICAL;  Surgeon: Mittie Gaskin, MD;  Location: [facility];  Service: Ophthalmology;  Laterality: Left   COLONOSCOPY WITH PROPOFOL  N/A 04/22/2013   Procedure: COLONOSCOPY WITH PROPOFOL ;  Surgeon: Toribio SHAUNNA Cedar, MD;  Location: [facility];  Service: Endoscopy;  Laterality: N/A;   CORONARY ANGIOPLASTY WITH STENT PLACEMENT N/A 1996   CORONARY ARTERY BYPASS GRAFT N/A 08/13/2002   Procedure: 4v CORONARY ARTERY BYPASS GRAFTING; Location: [facility]; Surgeon: Dessa Laine, MD   CYSTOSCOPY WITH INSERTION  OF UROLIFT N/A 12/25/2021   Procedure: CYSTOSCOPY WITH INSERTION OF UROLIFT;  Surgeon: Twylla Glendia BROCKS, MD;  Location: [facility];  Service: Urology;  Laterality: N/A;   DG ANGIO AV SHUNT*L*     right and left upper arms   FASCIOTOMY  03/03/2012   Procedure: FASCIOTOMY;  Surgeon: Arley JONELLE Curia, MD;  Location: [facility];  Service: Orthopedics;  Laterality: Right;  FASCIOTOMY RIGHT SMALL FINGER   FASCIOTOMY Left 08/17/2013   Procedure: FASCIOTOMY LEFT RING;  Surgeon: Arley JONELLE Curia, MD;  Location: [facility];  Service: Orthopedics;  Laterality: Left;   INCISION AND DRAINAGE ABSCESS Left 10/15/2015   Procedure: INCISION AND DRAINAGE ABSCESS;  Surgeon: Laneta JULIANNA Luna, MD;  Location: [facility];  Service: General;  Laterality: Left;   KIDNEY TRANSPLANT  09/13/2010   cadaver--at [facility]   LASER ABLATION CONDOLAMATA N/A 01/08/2023   Procedure: LASER REMOVAL ABLATION OF CONDYLOMATA;  Surgeon: Sheldon Standing, MD;  Location: [facility];  Service: General;  Laterality: N/A;  GEN AND LOCAL   RECTAL EXAM UNDER ANESTHESIA N/A 01/08/2023   Procedure: ANORECTAL EXAM UNDER ANESTHESIA;  Surgeon: Sheldon Standing, MD;  Location: [facility];  Service: General;  Laterality: N/A;   RIGHT HEART CATH N/A 11/15/2016   Procedure: RIGHT HEART CATH;  Surgeon: Darron Deatrice LABOR,  MD;  Location: [facility];  Service: Cardiovascular;  Laterality: N/A;   RIGHT HEART CATH N/A 05/03/2019   Procedure: RIGHT HEART CATH;  Surgeon: [name], MD;  Location[facility];  Service: Cardiovascular;  Laterality: N/A;   RIGHT HEART CATH N/A 04/04/2022   Procedure: RIGHT HEART CATH;  Surgeon: [name], MD;  Location: [facility] INVAS[facility] CV LAB;  Service: Cardiovascular;  Laterality: N/A;   ROUX-EN-Y GASTRIC BYPASS N/A 2015   TYMPANIC MEMBRANE REPAIR Left 03/2010   VASECTOMY     WART FULGURATION N/A 05/31/2022   Procedure: FULGURATION ANAL WART;  Surgeon: Tye Millet, DO;  Location: [facility];  Service: General;  Laterality: N/A;    Family History: Family History  Problem Relation Age of Onset   Heart disease Father    Kidney failure Father    Kidney disease Father    Diabetes Maternal Grandmother    Breast cancer Maternal Grandmother    Valvular heart disease Mother    Liver cancer Paternal Uncle    Liver cancer Paternal Grandmother    Prostate cancer Neg Hx     Social History  reports that he quit smoking about 29 years ago. His smoking use included cigars. He has never been exposed to tobacco smoke. He has never used smokeless tobacco. He reports that he does not currently use alcohol. He reports that he does not use drugs.  Allergies  Allergen Reactions   Iodinated Contrast Media Other (See Comments)    Kidney transplant  Iodinated contrast media (substance)   Iodine Other (See Comments)    Kidney transplant   Acetaminophen  Other (See Comments)    BC of transplant  acetaminophen    Nsaids Other (See Comments)    Kidney transplant   Ibuprofen Other (See Comments)    Due to kidney transplant    Medications   Current Facility-Administered Medications:    albuterol  (PROVENTIL ) (2.5 MG/3ML) 0.083% nebulizer solution 2.5 mg, 2.5 mg, Inhalation, Q6H PRN, Sira, Zackery, MD   amoxicillin -clavulanate (AUGMENTIN ) 500-125 MG per tablet 1  tablet, 1 tablet, Oral, BID, Nazari, Walid A, RPH, 1 tablet at 09/26/23 2148   buPROPion  (  WELLBUTRIN  SR) 12 hr tablet 150 mg, 150 mg, Oral, BID, Sira, Zackery, MD, 150 mg at October 10, 2023 2148   gabapentin  (NEURONTIN ) capsule 300 mg, 300 mg, Oral, BID, Sira, Zackery, MD, 300 mg at 2023-10-10 2025   HYDROcodone -acetaminophen  (NORCO/VICODIN) 5-325 MG per tablet 1 tablet, 1 tablet, Oral, Q4H PRN, Sira, Zackery, MD, 1 tablet at 09/27/23 9371   montelukast  (SINGULAIR ) tablet 10 mg, 10 mg, Oral, QHS, Sira, Zackery, MD, 10 mg at 10-10-23 2024   oxymetazoline  (AFRIN) 0.05 % nasal spray 2 spray, 2 spray, Each Nare, BID, Vaught, Carolee, MD, 2 spray at Oct 10, 2023 2148   pantoprazole  (PROTONIX ) EC tablet 40 mg, 40 mg, Oral, Daily, Sira, Zackery, MD, 40 mg at Oct 10, 2023 1222   predniSONE  (DELTASONE ) tablet 5 mg, 5 mg, Oral, Q breakfast, Sira, Zackery, MD, 5 mg at 2023-10-10 1222   rosuvastatin  (CRESTOR ) tablet 10 mg, 10 mg, Oral, Daily, Sira, Zackery, MD, 10 mg at 2023/10/10 1222   silver  sulfADIAZINE  (SILVADENE ) 1 % cream, , Topical, Daily, Agbata, Tochukwu, MD, Given at 10-Oct-2023 1227   sodium chloride  (OCEAN) 0.65 % nasal spray 2 spray, 2 spray, Each Nare, Q8H, Vaught, Creighton, MD, 2 spray at 09/27/23 9367   tacrolimus  (PROGRAF ) capsule 1 mg, 1 mg, Oral, QHS, Niels Kayla FALCON, RPH, 1 mg at 2023-10-10 2025   tacrolimus  (PROGRAF ) capsule 2 mg, 2 mg, Oral, Daily, Sira, Zackery, MD, 2 mg at October 10, 2023 1225   tamsulosin  (FLOMAX ) capsule 0.4 mg, 0.4 mg, Oral, QPC supper, Luz Skelton, MD, 0.4 mg at 10-10-2023 1720  Vitals   Vitals:   2023/10/10 0813 2023-10-10 0830 10/10/2023 1646 09/27/23 0408  BP: (!) 131/96 123/63 125/65 (!) 113/54  Pulse: 76 70 68 65  Resp: 18  15 18   Temp: 98.2 F (36.8 C)  97.9 F (36.6 C) 98.7 F (37.1 C)  TempSrc: Oral     SpO2: 95% 97% 91% (!) 89%  Weight:      Height:        Body mass index is 35.11 kg/m.   Physical Exam   Exam  Vitals:   10/10/23 1646 09/27/23 0408  BP: 125/65 (!)  113/54  Pulse: 68 65  Resp: 15 18  Temp: 97.9 F (36.6 C) 98.7 F (37.1 C)  SpO2: 91% (!) 89%    Gen: patient lying in bed, NAD CV: extremities appear well-perfused Resp: normal WOB  Neurologic exam MS: alert, oriented x4, follows commands but slowly Speech: mild dysarthria, no aphasia CN: PERRL, VFF, EOMI, sensation intact, face symmetric, hearing intact to voice Motor: LUE UTA 2/2 shoulder fracture, remainder of extremities drift to bed Sensory: SILT Reflexes: 2+ symm with toes down bilat Coordination: FNF intact on R Gait: deferred   Labs/Imaging/Neurodiagnostic studies   CBC:  Recent Labs  Lab October 10, 2023 0446 09/27/23 0437  WBC 3.0* 3.4*  HGB 7.5* 7.9*  HCT 24.8* 26.5*  MCV 94.7 96.7  PLT 118* 117*   Basic Metabolic Panel:  Lab Results  Component Value Date   NA 143 09/27/2023   K 3.5 09/27/2023   CO2 30 09/27/2023   GLUCOSE 138 (H) 09/27/2023   BUN 112 (H) 09/27/2023   CREATININE 3.86 (H) 09/27/2023   CALCIUM  8.7 (L) 09/27/2023   GFRNONAA 17 (L) 09/27/2023   GFRAA 52 (L) 11/15/2019   Lipid Panel:  Lab Results  Component Value Date   LDLCALC 18 08/21/2023   HgbA1c:  Lab Results  Component Value Date   HGBA1C 7.7 (H) 04/09/2023   Urine Drug Screen:  Component Value Date/Time   LABOPIA NONE DETECTED 02/03/2023 2334   COCAINSCRNUR NONE DETECTED 02/03/2023 2334   LABBENZ NONE DETECTED 02/03/2023 2334   AMPHETMU NONE DETECTED 02/03/2023 2334   THCU NONE DETECTED 02/03/2023 2334   LABBARB NONE DETECTED 02/03/2023 2334    Alcohol Level     Component Value Date/Time   ETH <10 02/03/2023 1650   INR  Lab Results  Component Value Date   INR 1.2 02/03/2023   APTT  Lab Results  Component Value Date   APTT 67 (H) 04/10/2023   AED levels: No results found for: PHENYTOIN, ZONISAMIDE, LAMOTRIGINE, LEVETIRACETA  CT Head without contrast(Personally reviewed): No ICH  ASSESSMENT   [name]. is a 63 y.o. male with hx of   a fib on eliquis  at home who is admitted with epistaxis following a fall. Stroke code was called this AM for decreased responsiveness but entirely nonfocal on my exam. Recommend MRI brain but if unremarkable no indication for further stroke workup.   RECOMMENDATIONS   - MRI brain wo contrast - Will continue to follow ______________________________________________________________________    Signed, Elida CHRISTELLA Ross, MD [facility]

## 2023-09-27 NOTE — Progress Notes (Signed)
 Progress Note   Patient: [name]. FMW:[id number] DOB: 25-Mar-1961 DOA: 09/25/2023     2 DOS: the patient was seen and examined on 09/27/2023   Brief hospital course:  [name]. is a 63 y.o. male  who presents to the hospital due to nosebleed from a fall. He fell and landed on his face. Of note he is on anti-coagulation with eliquis  for afib. The ER doctor was dealing with persistent bleeding from the nose and as a result Dr. Milissa from ENT was consulted and saw the pt in the ER. Pt currently has packing in place and Dr. Milissa requested that the pt be placed in the hospital due to ongoing anemia and requirement for nasal packing.      07/04 -rapid response called due to change in mental status.  Patient seen and examined at the bedside noted to be minimally responsive but able to follow simple commands.  Has some right-sided weakness.  Code stroke called   07/05 -sitting up in a recliner.  Does not remember what happened yesterday.  Wife is at the bedside   Assessment and Plan:  Episode of unresponsiveness CVA ruled out Rule out seizures Patient's last known well was about 7:15 AM and about 8:20 AM patient was found to be minimally responsive and only arousable to deep sternal rub.  He was later able to follow simple commands but noted to have some right-sided weakness.  Has a history of Bell's palsy which could explain his facial droop. Code stroke was called Not a candidate for tPA due to severe epistaxis requiring nasal packing Appreciate neurology input Not on antiplatelets due to severe epistaxis MRI of the brain showed mild nonspecific sinus disease with mild air-fluid levels which can be seen in acute sinusitis. Continue statins Obtain EEG to rule out seizures       ABLA due to Nose bleed  Patient admitted to the hospital for severe epistaxis in the setting of chronic anticoagulation use Appreciate ENT assistance.  Patient is status post nasal  packing bilaterally Continue Afrin 3 times daily x 72 hours to packs and saline spray 2 puffs bilaterally 3 times daily Continue Augmentin  875 mg PO bid x 5 days Noted to have a drop in his H&H from 11.5 (1 month ago) to 7.5 >> 7.9 No further episodes of bleeding and H&H is stable No indication for blood transfusion at this time       Chronic systolic CHF   Last known LVEF of 40 to 45% from a 2D echocardiogram which was done 12/24 Repeat echocardiogram results pending Continue torsemide  80 mg twice daily Not on ACE or ARB due to chronic kidney disease     CAD Continue statins   HTN Blood pressure is stable       Renal transplant recipient (2012)  Continue tacrolimus  and prednisone      BPH Continue Flomax  0.4 mg PO daily      DM2 with complications of stage IV chronic kidney disease status post renal transplant - Continue consistent carbohydrate diet     Pancytopenia Most likely related to immunosuppression Monitor closely       Depression Continue bupropion           Subjective: No new complaints.  Does not remember the events of yesterday  Physical Exam: Vitals:   09/26/23 0830 09/26/23 1646 09/27/23 0408 09/27/23 0817  BP: 123/63 125/65 (!) 113/54 (!) 119/54  Pulse: 70 68 65 (!) 57  Resp:  15  18 16  Temp:  97.9 F (36.6 C) 98.7 F (37.1 C)   TempSrc:      SpO2: 97% 91% (!) 89% 94%  Weight:      Height:       HENT:     Head: Normocephalic.     Nose:     Comments: B/l nasal packing  Cardiovascular:     Rate and Rhythm: Normal rate.  Pulmonary:     Effort: Pulmonary effort is normal.  Abdominal:     Palpations: Abdomen is soft.  Musculoskeletal:        General: Normal range of motion.  Skin:    General: Skin is warm.     Comments: 2+ pitting edema b/l lower ext's  Neurological:     Mental Status: Lethargic but arouses to loud verbal stimuli.  Able to follow simple commands Psychiatric:     Unable to assess  Data Reviewed: Hemoglobin  7.9, BUN 112, creatinine 3.86 Labs reviewed  Family Communication: Plan of care discussed with patient and his wife at the bedside.  They verbalized understanding and agree with the plan.  Disposition: Status is: Inpatient Remains inpatient appropriate because: Monitor H&H and transfuse as needed  Planned Discharge Destination: Home    Time spent: 40 minutes  Author: Aimee Somerset, MD 09/27/2023 11:28 AM  For on call review [email or web address].

## 2023-09-27 NOTE — Plan of Care (Signed)
 The packing remains in the patient's nose. No c/o pain or discomfort voiced.

## 2023-09-28 DIAGNOSIS — R040 Epistaxis: Secondary | ICD-10-CM | POA: Diagnosis not present

## 2023-09-28 LAB — CBC
HCT: 27 % — ABNORMAL LOW (ref 39.0–52.0)
Hemoglobin: 8.2 g/dL — ABNORMAL LOW (ref 13.0–17.0)
MCH: 29.3 pg (ref 26.0–34.0)
MCHC: 30.4 g/dL (ref 30.0–36.0)
MCV: 96.4 fL (ref 80.0–100.0)
Platelets: 102 K/uL — ABNORMAL LOW (ref 150–400)
RBC: 2.8 MIL/uL — ABNORMAL LOW (ref 4.22–5.81)
RDW: 17.1 % — ABNORMAL HIGH (ref 11.5–15.5)
WBC: 3.1 K/uL — ABNORMAL LOW (ref 4.0–10.5)
nRBC: 0 % (ref 0.0–0.2)

## 2023-09-28 LAB — COMPREHENSIVE METABOLIC PANEL WITH GFR
ALT: 19 U/L (ref 0–44)
AST: 21 U/L (ref 15–41)
Albumin: 2.9 g/dL — ABNORMAL LOW (ref 3.5–5.0)
Alkaline Phosphatase: 165 U/L — ABNORMAL HIGH (ref 38–126)
Anion gap: 10 (ref 5–15)
BUN: 110 mg/dL — ABNORMAL HIGH (ref 8–23)
CO2: 31 mmol/L (ref 22–32)
Calcium: 8.7 mg/dL — ABNORMAL LOW (ref 8.9–10.3)
Chloride: 102 mmol/L (ref 98–111)
Creatinine, Ser: 3.63 mg/dL — ABNORMAL HIGH (ref 0.61–1.24)
GFR, Estimated: 18 mL/min — ABNORMAL LOW (ref >60)
Glucose, Bld: 158 mg/dL — ABNORMAL HIGH (ref 70–99)
Potassium: 3.7 mmol/L (ref 3.5–5.1)
Sodium: 143 mmol/L (ref 135–145)
Total Bilirubin: 0.8 mg/dL (ref 0.0–1.2)
Total Protein: 6 g/dL — ABNORMAL LOW (ref 6.5–8.1)

## 2023-09-28 LAB — MAGNESIUM: Magnesium: 2.5 mg/dL — ABNORMAL HIGH (ref 1.7–2.4)

## 2023-09-28 MED ORDER — MYCOPHENOLATE SODIUM 180 MG PO TBEC
360.0000 mg | DELAYED_RELEASE_TABLET | Freq: Every morning | ORAL | Status: DC
  Administered 2023-09-29: 360 mg via ORAL
  Filled 2023-09-28: qty 2

## 2023-09-28 MED ORDER — MYCOPHENOLATE SODIUM 180 MG PO TBEC
180.0000 mg | DELAYED_RELEASE_TABLET | Freq: Every evening | ORAL | Status: DC
  Administered 2023-09-28: 180 mg via ORAL
  Filled 2023-09-28 (×2): qty 1

## 2023-09-28 NOTE — Consult Note (Signed)
 WOC Nurse Consult Note: Reason for Consult: glute skin tear/hematoma  Wound type: partial thickness skin loss noted to L glute with underlying hematoma (patient had a fall while on blood thinner Eliquis )  Pressure Injury POA: NA  Measurement:see nursing flowsheet Wound bed: pink area of peeling skin over top of hematoma  Drainage (amount, consistency, odor)  Periwound: hematoma, ecchymosis noted to R glute as well  Dressing procedure/placement/frequency: Cleanse buttocks with soap and water , dry and apply Xeroform gauze ([place] [phone]) to area of peeling skin/hematoma daily.  Secure with silicone foam or ABD pad whichever is preferred.   POC discussed with bedside nurse. WOC team will not follow. Re-consult if further needs arise.   Thank you,    Powell Bar MSN, RN-BC, [employer]

## 2023-09-28 NOTE — Plan of Care (Signed)
 The patient continues with gauze inhis nose s/p nose bleed. No s/s of acute distress noted.

## 2023-09-28 NOTE — Progress Notes (Signed)
 Progress Note   Patient: [name]. FMW:[id number] DOB: 27-Mar-1960 DOA: 09/25/2023     3 DOS: the patient was seen and examined on 09/28/2023   Brief hospital course:  [name]. is a 63 y.o. male  who presents to the hospital due to nosebleed from a fall. He fell and landed on his face. Of note he is on anti-coagulation with eliquis  for afib. The ER doctor was dealing with persistent bleeding from the nose and as a result Dr. Milissa from ENT was consulted and saw the pt in the ER. Pt currently has packing in place and Dr. Milissa requested that the pt be placed in the hospital due to ongoing anemia and requirement for nasal packing.      07/04 -rapid response called due to change in mental status.  Patient seen and examined at the bedside noted to be minimally responsive but able to follow simple commands.  Has some right-sided weakness.  Code stroke called     07/05 -sitting up in a recliner.  Does not remember what happened yesterday.  Wife is at the bedside   07/06 -feels better.  Able to ambulate in the hall.     Assessment and Plan:  Episode of unresponsiveness CVA ruled out Rule out seizures Patient's last known well was about 7:15 AM on 07/04 and about 8:20 AM patient was found to be minimally responsive and only arousable to deep sternal rub.  He was later able to follow simple commands even though he was slow but noted to have some right-sided weakness.  Has a history of Bell's palsy which could explain his facial droop. Code stroke was called Not a candidate for tPA due to severe epistaxis requiring nasal packing Appreciate neurology input Not on antiplatelets due to severe epistaxis MRI of the brain showed mild nonspecific sinus disease with mild air-fluid levels which can be seen in acute sinusitis. Continue statins Obtain EEG to rule out seizures       ABLA due to Nose bleed  Patient admitted to the hospital for severe epistaxis in the setting  of chronic anticoagulation use Appreciate ENT assistance.  Patient is status post nasal packing bilaterally Continue Afrin 3 times daily x 72 hours to packs and saline spray 2 puffs bilaterally 3 times daily Continue Augmentin  875 mg PO bid x 5 days Noted to have a drop in his H&H from 11.5 (1 month ago) to 7.5 >> 7.9 >> 8.2 No further episodes of bleeding and H&H is stable No indication for blood transfusion at this time Discussed with ENT who plans on removing nasal packing in a.m.       Chronic systolic CHF   Last known LVEF of 40 to 45% from a 2D echocardiogram which was done 12/24 Repeat echocardiogram  shows an LVEF of 40 to 45% Continue torsemide  80 mg twice daily Not on ACE or ARB due to chronic kidney disease     CAD Continue statins   HTN Blood pressure is stable       Renal transplant recipient (2012)  Continue tacrolimus  and prednisone      BPH Continue Flomax  0.4 mg PO daily      DM2 with complications of stage IV chronic kidney disease status post renal transplant - Continue consistent carbohydrate diet     Pancytopenia Most likely related to immunosuppression Monitor closely       Depression Continue bupropion      Physical deconditioning Left shoulder fracture Appreciate PT input  They recommend home health on discharge   Skin tear right buttock Right buttock hematoma Wound care consult     Subjective: No new complaint  Physical Exam: Vitals:   09/27/23 1539 09/27/23 2016 09/28/23 0503 09/28/23 0757  BP: (!) 120/56 (!) 126/44 (!) 136/59 (!) 105/57  Pulse: 65 (!) 58 61 67  Resp: 18 16 18 18   Temp: 97.7 F (36.5 C) 97.6 F (36.4 C) 98 F (36.7 C) 98.4 F (36.9 C)  TempSrc:  Oral  Oral  SpO2: 93% 95% 93% 94%  Weight:      Height:       HENT:     Head: Normocephalic.     Nose:     Comments: B/l nasal packing  Cardiovascular:     Rate and Rhythm: Normal rate.  Pulmonary:     Effort: Pulmonary effort is normal.  Abdominal:      Palpations: Abdomen is soft.  Musculoskeletal:        General: Normal range of motion.  Skin:    General: Skin is warm.     Comments: 2+ pitting edema b/l lower ext's  Neurological:     Mental Status: Awake, alert and oriented to person place and time. Psychiatric:     Normal mood and affect  Data Reviewed: White count 3.1, hemoglobin 8.2, platelet count 102, creatinine 3.63 Labs reviewed  Family Communication: Plan of care discussed with patient and his wife at the bedside.  They verbalized understanding and agree with the plan.  Disposition: Status is: Inpatient Remains inpatient appropriate because: Possible discharge in a.m.  Planned Discharge Destination: Home with Home Health    Time spent: 35 minutes  Author: Aimee Somerset, MD 09/28/2023 12:14 PM  For on call review [email or web address].

## 2023-09-28 NOTE — Evaluation (Signed)
 Physical Therapy Evaluation Patient Details Name: [name]. MRN: [id number] DOB: 07/05/1960 Today's Date: 09/28/2023  History of Present Illness  Patient is a 63 year old male with nosebleed from a fall, severe epistaxis in the setting of chronic anticoagulation use. Recent shoulder fracture on the left side in a sling. Had an episode of unresponsiveness while in the hospital, MRI negative for acute stroke. History of Bell's Palsy, multiple falls, cardiac catheterization.  Clinical Impression  Patient is agreeable to PT evaluation. He reports several falls recently and needing intermittent assistance in the home. He lives with his supportive spouse.  Today the patient walked in the hallway with occasional hallway railing or hand held assistance for support. Patient had the sling in place to LUE and maintained NWB of the left arm with functional mobility. He does reports LE muscle fatigue with walking and required two short standing rest breaks to complete the lap. Recommend to continue PT to maximize independence and decrease caregiver burden.       If plan is discharge home, recommend the following: A little help with walking and/or transfers;A little help with bathing/dressing/bathroom;Help with stairs or ramp for entrance;Assistance with cooking/housework   Can travel by private vehicle        Equipment Recommendations None recommended by PT  Recommendations for Other Services       Functional Status Assessment Patient has had a recent decline in their functional status and demonstrates the ability to make significant improvements in function in a reasonable and predictable amount of time.     Precautions / Restrictions Precautions Precautions: Fall Recall of Precautions/Restrictions: Intact Restrictions Weight Bearing Restrictions Per Provider Order: No LUE Weight Bearing Per Provider Order: Non weight bearing Other Position/Activity Restrictions: presume NWB LUE due  to prior shoulder fracutre, patient wearing a sling from home.      Mobility  Bed Mobility               General bed mobility comments: not assessed as patient sitting up on arrival and post session    Transfers Overall transfer level: Needs assistance Equipment used: 1 person hand held assist Transfers: Sit to/from Stand Sit to Stand: Min assist           General transfer comment: lifting assistance provided    Ambulation/Gait Ambulation/Gait assistance: Contact guard assist Gait Distance (Feet): 170 Feet Assistive device: 1 person hand held assist (hallway rail and HHA intermittently) Gait Pattern/deviations: Step-through pattern, Decreased stride length Gait velocity: decreased     General Gait Details: patient walked a lap around the nursing station with 2 standing rest breaks due to muscle fatigue in LE. intermittent use of hallway railing or HHA provided intermittently.  Stairs            Wheelchair Mobility     Tilt Bed    Modified Rankin (Stroke Patients Only)       Balance Overall balance assessment: Needs assistance, History of Falls Sitting-balance support: Feet supported Sitting balance-Leahy Scale: Fair     Standing balance support: Single extremity supported Standing balance-Leahy Scale: Fair                               Pertinent Vitals/Pain Pain Assessment Pain Assessment: 0-10 Pain Score: 6  Pain Location: left shoulder Pain Descriptors / Indicators: Discomfort Pain Intervention(s): Monitored during session, Limited activity within patient's tolerance, Repositioned (sling adjusted for proper positioning for comfort)  Home Living Family/patient expects to be discharged to:: Private residence Living Arrangements: Spouse/significant other Available Help at Discharge: Family;Available PRN/intermittently Type of Home: House Home Access: Stairs to enter   Entrance Stairs-Number of Steps: 2   Home Layout: One  level Home Equipment: Shower seat Additional Comments: has a paid caregiver in the home. spouse works during the day    Prior Function Prior Level of Function : Needs assist             Mobility Comments: recently has required intermittent assistance for mobility due to weakness and multiple falls. ADLs Comments: recently has required assistance due to shoulder limitations     Extremity/Trunk Assessment   Upper Extremity Assessment Upper Extremity Assessment: LUE deficits/detail;Left hand dominant LUE Deficits / Details: sling in place. not formally assessed    Lower Extremity Assessment Lower Extremity Assessment: Generalized weakness       Communication   Communication Communication: No apparent difficulties    Cognition Arousal: Alert Behavior During Therapy: WFL for tasks assessed/performed   PT - Cognitive impairments: No apparent impairments                         Following commands: Intact       Cueing Cueing Techniques: Verbal cues     General Comments      Exercises     Assessment/Plan    PT Assessment Patient needs continued PT services  PT Problem List Decreased strength;Decreased range of motion;Decreased activity tolerance;Decreased balance;Decreased mobility       PT Treatment Interventions DME instruction;Gait training;Stair training;Functional mobility training;Therapeutic activities;Therapeutic exercise;Balance training;Neuromuscular re-education;Cognitive remediation;Patient/family education    PT Goals (Current goals can be found in the Care Plan section)  Acute Rehab PT Goals Patient Stated Goal: to have assistance at home, regain independence PT Goal Formulation: With patient Time For Goal Achievement: 10/12/23 Potential to Achieve Goals: Fair    Frequency Min 2X/week     Co-evaluation               AM-PAC PT 6 Clicks Mobility  Outcome Measure Help needed turning from your back to your side while in a  flat bed without using bedrails?: A Little Help needed moving from lying on your back to sitting on the side of a flat bed without using bedrails?: A Little Help needed moving to and from a bed to a chair (including a wheelchair)?: A Little Help needed standing up from a chair using your arms (e.g., wheelchair or bedside chair)?: A Little Help needed to walk in hospital room?: A Little Help needed climbing 3-5 steps with a railing? : A Little 6 Click Score: 18    End of Session Equipment Utilized During Treatment:  (sling LUE) Activity Tolerance: Patient limited by fatigue Patient left: in chair;with call bell/phone within reach Nurse Communication: Mobility status PT Visit Diagnosis: Muscle weakness (generalized) (M62.81);History of falling (Z91.81)    Time: 0812-0828 PT Time Calculation (min) (ACUTE ONLY): 16 min   Charges:   PT Evaluation $PT Eval Moderate Complexity: 1 Mod   PT General Charges $$ ACUTE PT VISIT: 1 Visit         Randine Essex, PT, MPT   Randine LULLA Essex 09/28/2023, 9:44 AM

## 2023-09-29 ENCOUNTER — Inpatient Hospital Stay

## 2023-09-29 DIAGNOSIS — R569 Unspecified convulsions: Secondary | ICD-10-CM | POA: Diagnosis not present

## 2023-09-29 DIAGNOSIS — R404 Transient alteration of awareness: Secondary | ICD-10-CM | POA: Diagnosis not present

## 2023-09-29 DIAGNOSIS — R040 Epistaxis: Secondary | ICD-10-CM | POA: Diagnosis not present

## 2023-09-29 LAB — GLUCOSE, CAPILLARY: Glucose-Capillary: 119 mg/dL — ABNORMAL HIGH (ref 70–99)

## 2023-09-29 LAB — COMPREHENSIVE METABOLIC PANEL WITH GFR
ALT: 19 U/L (ref 0–44)
AST: 21 U/L (ref 15–41)
Albumin: 3 g/dL — ABNORMAL LOW (ref 3.5–5.0)
Alkaline Phosphatase: 150 U/L — ABNORMAL HIGH (ref 38–126)
Anion gap: 8 (ref 5–15)
BUN: 107 mg/dL — ABNORMAL HIGH (ref 8–23)
CO2: 31 mmol/L (ref 22–32)
Calcium: 8.8 mg/dL — ABNORMAL LOW (ref 8.9–10.3)
Chloride: 100 mmol/L (ref 98–111)
Creatinine, Ser: 3.19 mg/dL — ABNORMAL HIGH (ref 0.61–1.24)
GFR, Estimated: 21 mL/min — ABNORMAL LOW (ref >60)
Glucose, Bld: 181 mg/dL — ABNORMAL HIGH (ref 70–99)
Potassium: 3.8 mmol/L (ref 3.5–5.1)
Sodium: 139 mmol/L (ref 135–145)
Total Bilirubin: 1 mg/dL (ref 0.0–1.2)
Total Protein: 6.4 g/dL — ABNORMAL LOW (ref 6.5–8.1)

## 2023-09-29 LAB — CBC
HCT: 26.3 % — ABNORMAL LOW (ref 39.0–52.0)
Hemoglobin: 7.8 g/dL — ABNORMAL LOW (ref 13.0–17.0)
MCH: 28.5 pg (ref 26.0–34.0)
MCHC: 29.7 g/dL — ABNORMAL LOW (ref 30.0–36.0)
MCV: 96 fL (ref 80.0–100.0)
Platelets: 109 K/uL — ABNORMAL LOW (ref 150–400)
RBC: 2.74 MIL/uL — ABNORMAL LOW (ref 4.22–5.81)
RDW: 16.7 % — ABNORMAL HIGH (ref 11.5–15.5)
WBC: 3.3 K/uL — ABNORMAL LOW (ref 4.0–10.5)
nRBC: 0 % (ref 0.0–0.2)

## 2023-09-29 LAB — MAGNESIUM: Magnesium: 2.4 mg/dL (ref 1.7–2.4)

## 2023-09-29 MED ORDER — INSULIN GLARGINE-YFGN 100 UNIT/ML ~~LOC~~ SOLN
15.0000 [IU] | Freq: Every day | SUBCUTANEOUS | Status: DC
  Filled 2023-09-29: qty 0.15

## 2023-09-29 MED ORDER — SALINE SPRAY 0.65 % NA SOLN: 2.0000 | Freq: Three times a day (TID) | NASAL | Status: DC

## 2023-09-29 MED ORDER — LOSARTAN POTASSIUM 25 MG PO TABS: 25.0000 mg | ORAL_TABLET | Freq: Every day | ORAL | Status: DC

## 2023-09-29 MED ORDER — INSULIN GLARGINE-YFGN 100 UNIT/ML ~~LOC~~ SOPN: 15.0000 [IU] | PEN_INJECTOR | Freq: Every day | SUBCUTANEOUS | Status: DC

## 2023-09-29 NOTE — Progress Notes (Signed)
 Eeg done

## 2023-09-29 NOTE — Plan of Care (Addendum)
  Problem: Activity: Goal: Risk for activity intolerance will decrease Outcome: Progressing   Problem: Nutrition: Goal: Adequate nutrition will be maintained Outcome: Progressing   Problem: Coping: Goal: Level of anxiety will decrease Outcome: Progressing   Problem: Elimination: Goal: Will not experience complications related to bowel motility Outcome: Progressing   Problem: Pain Managment: Goal: General experience of comfort will improve and/or be controlled Outcome: Progressing   Problem: Safety: Goal: Ability to remain free from injury will improve Outcome: Progressing   Problem: Skin Integrity: Goal: Risk for impaired skin integrity will decrease Outcome: Progressing   Problem: Clinical Measurements: Goal: Ability to maintain clinical measurements within normal limits will improve Outcome: Progressing

## 2023-09-29 NOTE — TOC Progression Note (Signed)
 Transition of Care (TOC) - Progression Note    Patient Details  Name: [name]. MRN: [id number] Date of Birth: April 11, 1960  [facility] CM/SW Contact  Marinda Cooks, RN Phone Number: 09/29/2023, 12:15 PM  Clinical Narrative:    Per chart review pt is HOD#4 cont to medically managed s/p and nose bleed . No TOC requested or assessed at this time.       Expected Discharge Plan and Services                                               Social Determinants of Health (SDOH) Interventions SDOH Screenings   Food Insecurity: No Food Insecurity (09/25/2023)  Housing: Low Risk  (09/25/2023)  Transportation Needs: No Transportation Needs (09/25/2023)  Utilities: Not At Risk (09/25/2023)  Depression (PHQ2-9): High Risk (09/12/2023)  Social Connections: Socially Integrated (04/11/2023)  Tobacco Use: Medium Risk (09/25/2023)    Readmission Risk Interventions    04/09/2023   12:05 PM 06/24/2022    9:39 AM 11/04/2021   11:44 AM  Readmission Risk Prevention Plan  Transportation Screening Complete Complete Complete  HRI or Home Care Consult  --   Social Work Consult for Recovery Care Planning/Counseling  Complete   Palliative Care Screening  Not Applicable   Medication Review Oceanographer) Complete Complete Complete  PCP or Specialist appointment within 3-5 days of discharge Complete  Complete  HRI or Home Care Consult   Complete  SW Recovery Care/Counseling Consult Complete  Complete  Palliative Care Screening Not Applicable  Not Applicable  Skilled Nursing Facility Not Applicable  Not Applicable

## 2023-09-29 NOTE — TOC Progression Note (Signed)
 Transition of Care (TOC) - Progression Note    Patient Details  Name: [name]. MRN: [id number] Date of Birth: 1960/11/19  Transition of Care [facility]) CM/SW Contact  Marinda Cooks, RN Phone Number: 09/29/2023, 5:18 PM  Clinical Narrative:    This Cm spoke with pt at bedside introduced my role and discussed dc plan for [facility] , pt agreed with plan and denied having a HH agency preference . This confirmed HH for dc with [facility]. TOC will cont to follow dc planning/ care coordination while pt is hospitalized and update as applicable.         Expected Discharge Plan and Services                                               Social Determinants of Health (SDOH) Interventions SDOH Screenings   Food Insecurity: No Food Insecurity (09/25/2023)  Housing: Low Risk  (09/25/2023)  Transportation Needs: No Transportation Needs (09/25/2023)  Utilities: Not At Risk (09/25/2023)  Depression (PHQ2-9): High Risk (09/12/2023)  Social Connections: Socially Integrated (04/11/2023)  Tobacco Use: Medium Risk (09/25/2023)    Readmission Risk Interventions    04/09/2023   12:05 PM 06/24/2022    9:39 AM 11/04/2021   11:44 AM  Readmission Risk Prevention Plan  Transportation Screening Complete Complete Complete  HRI or Home Care Consult  --   Social Work Consult for Recovery Care Planning/Counseling  Complete   Palliative Care Screening  Not Applicable   Medication Review Oceanographer) Complete Complete Complete  PCP or Specialist appointment within 3-5 days of discharge Complete  Complete  HRI or Home Care Consult   Complete  SW Recovery Care/Counseling Consult Complete  Complete  Palliative Care Screening Not Applicable  Not Applicable  Skilled Nursing Facility Not Applicable  Not Applicable

## 2023-09-29 NOTE — Progress Notes (Signed)
 Physical Therapy Treatment Patient Details Name: [name]. MRN: [id number] DOB: 1960-09-11 Today's Date: 09/29/2023   History of Present Illness Patient is a 63 year old male with nosebleed from a fall, severe epistaxis in the setting of chronic anticoagulation use. Recent shoulder fracture on the left side in a sling. Had an episode of unresponsiveness while in the hospital, MRI negative for acute stroke. History of Bell's Palsy, multiple falls, cardiac catheterization.    PT Comments  Pt received upright in recliner agreeable to PT. Pt continues to need minA from recliner due to LE weakness. Pt able to progress gait to ~200' and complete stairs safely with SUE support. Reviewed standing therex to address glut pain with mobility and d/c recs. Pt placed back in recliner with all needs in reach. Pt making improvements with gait progressing from CGA to supervision. D/c recs remain appropriate.    If plan is discharge home, recommend the following: A little help with walking and/or transfers;A little help with bathing/dressing/bathroom;Help with stairs or ramp for entrance;Assistance with cooking/housework   Can travel by private vehicle        Equipment Recommendations  None recommended by PT    Recommendations for Other Services       Precautions / Restrictions Precautions Precautions: Fall Recall of Precautions/Restrictions: Intact Restrictions Other Position/Activity Restrictions: presume NWB LUE due to prior shoulder fracutre, patient wearing a sling from home.     Mobility  Bed Mobility               General bed mobility comments: NT. In recliner pre and post session. Patient Response: Cooperative  Transfers Overall transfer level: Needs assistance Equipment used: None Transfers: Sit to/from Stand Sit to Stand: Min assist           General transfer comment: from recliner    Ambulation/Gait Ambulation/Gait assistance: Contact guard assist,  Supervision Gait Distance (Feet): 200 Feet Assistive device: None Gait Pattern/deviations: Step-through pattern, Decreased stride length           Stairs Stairs: Yes Stairs assistance: Modified independent (Device/Increase time) Stair Management: One rail Right, Step to pattern, Forwards Number of Stairs: 4 General stair comments: asc/desc safely using appropriate UE.   Wheelchair Mobility     Tilt Bed Tilt Bed Patient Response: Cooperative  Modified Rankin (Stroke Patients Only)       Balance Overall balance assessment: Needs assistance, History of Falls Sitting-balance support: Feet supported Sitting balance-Leahy Scale: Fair     Standing balance support: Single extremity supported Standing balance-Leahy Scale: Fair                              Hotel manager: No apparent difficulties  Cognition Arousal: Alert Behavior During Therapy: WFL for tasks assessed/performed   PT - Cognitive impairments: No apparent impairments                         Following commands: Intact      Cueing Cueing Techniques: Verbal cues  Exercises General Exercises - Lower Extremity Hip ABduction/ADduction: AROM, Strengthening, Right, 5 reps, Standing Other Exercises Other Exercises: x5 hip extension standing bilat with SUE support    General Comments        Pertinent Vitals/Pain Pain Assessment Pain Assessment: Faces Faces Pain Scale: Hurts little more Pain Location: left shoulder Pain Descriptors / Indicators: Discomfort, Grimacing Pain Intervention(s): Monitored during session, Repositioned, Other (comment) (repositioned  sling for pt)    Home Living                          Prior Function            PT Goals (current goals can now be found in the care plan section) Acute Rehab PT Goals Patient Stated Goal: to have assistance at home, regain independence PT Goal Formulation: With patient Time For  Goal Achievement: 10/12/23 Potential to Achieve Goals: Fair Progress towards PT goals: Progressing toward goals    Frequency    Min 2X/week      PT Plan      Co-evaluation              AM-PAC PT 6 Clicks Mobility   Outcome Measure  Help needed turning from your back to your side while in a flat bed without using bedrails?: A Little Help needed moving from lying on your back to sitting on the side of a flat bed without using bedrails?: A Little Help needed moving to and from a bed to a chair (including a wheelchair)?: A Little Help needed standing up from a chair using your arms (e.g., wheelchair or bedside chair)?: A Little Help needed to walk in hospital room?: A Little Help needed climbing 3-5 steps with a railing? : A Little 6 Click Score: 18    End of Session Equipment Utilized During Treatment: Other (comment) (sling LUE) Activity Tolerance: Patient tolerated treatment well Patient left: in chair;with call bell/phone within reach Nurse Communication: Mobility status PT Visit Diagnosis: Muscle weakness (generalized) (M62.81);History of falling (Z91.81)     Time: 8948-8891 PT Time Calculation (min) (ACUTE ONLY): 17 min  Charges:    $Therapeutic Activity: 8-22 mins PT General Charges $$ ACUTE PT VISIT: 1 Visit                    Dorina HERO. Fairly IV, PT, DPT Physical Therapist- [place] 09/29/2023, 1:41 PM

## 2023-09-29 NOTE — Procedures (Signed)
 Patient Name: [name].  MRN: [id number]  Epilepsy Attending: Arlin MALVA Krebs  Referring Physician/Provider: Lanetta Lingo, MD  Date: 09/29/2023 Duration: 30.18 mins  Patient history: 63yo M with episode of unresponsiveness. EEG to evaluate for seizure  Level of alertness: Awake, asleep  AEDs during EEG study: GBP  Technical aspects: This EEG study was done with scalp electrodes positioned according to the 10-20 International system of electrode placement. Electrical activity was reviewed with band pass filter of 1-70Hz , sensitivity of 7 uV/mm, display speed of 39mm/sec with a 60Hz  notched filter applied as appropriate. EEG data were recorded continuously and digitally stored.  Video monitoring was available and reviewed as appropriate.  Description: The posterior dominant rhythm consists of 8 Hz activity of moderate voltage (25-35 uV) seen predominantly in posterior head regions, symmetric and reactive to eye opening and eye closing. Sleep was characterized by vertex waves, sleep spindles (12 to 14 Hz), maximal frontocentral region. Hyperventilation and photic stimulation were not performed.     IMPRESSION: This study is within normal limits. No seizures or epileptiform discharges were seen throughout the recording.   A normal interictal EEG does not exclude the diagnosis of epilepsy.  [name]

## 2023-09-29 NOTE — Hospital Course (Signed)
 No sob at rest or with activity. Walked with PT. Has some LE edema and discoloration of bilateral lower extremities.

## 2023-09-29 NOTE — Progress Notes (Signed)
..  09/29/2023 8:18 AM  [name] [id number]    Temp:  [98.1 F (36.7 C)-98.3 F (36.8 C)] 98.1 F (36.7 C) (07/07 0425) Pulse Rate:  [63-72] 63 (07/07 0425) Resp:  [18-20] 20 (07/07 0425) BP: (131-136)/(59-74) 136/70 (07/07 0425) SpO2:  [93 %-94 %] 94 % (07/07 0425),     Intake/Output Summary (Last 24 hours) at 09/29/2023 0818 Last data filed at 09/29/2023 0100 Gross per 24 hour  Intake 740 ml  Output 600 ml  Net 140 ml    Results for orders placed or performed during the hospital encounter of 09/25/23 (from the past 24 hours)  CBC     Status: Abnormal   Collection Time: 09/29/23  5:07 AM  Result Value Ref Range   WBC 3.3 (L) 4.0 - 10.5 K/uL   RBC 2.74 (L) 4.22 - 5.81 MIL/uL   Hemoglobin 7.8 (L) 13.0 - 17.0 g/dL   HCT 73.6 (L) 60.9 - 47.9 %   MCV 96.0 80.0 - 100.0 fL   MCH 28.5 26.0 - 34.0 pg   MCHC 29.7 (L) 30.0 - 36.0 g/dL   RDW 83.2 (H) 88.4 - 84.4 %   Platelets 109 (L) 150 - 400 K/uL   nRBC 0.0 0.0 - 0.2 %  Comprehensive metabolic panel with GFR     Status: Abnormal   Collection Time: 09/29/23  5:07 AM  Result Value Ref Range   Sodium 139 135 - 145 mmol/L   Potassium 3.8 3.5 - 5.1 mmol/L   Chloride 100 98 - 111 mmol/L   CO2 31 22 - 32 mmol/L   Glucose, Bld 181 (H) 70 - 99 mg/dL   BUN 892 (H) 8 - 23 mg/dL   Creatinine, Ser 6.80 (H) 0.61 - 1.24 mg/dL   Calcium  8.8 (L) 8.9 - 10.3 mg/dL   Total Protein 6.4 (L) 6.5 - 8.1 g/dL   Albumin  3.0 (L) 3.5 - 5.0 g/dL   AST 21 15 - 41 U/L   ALT 19 0 - 44 U/L   Alkaline Phosphatase 150 (H) 38 - 126 U/L   Total Bilirubin 1.0 0.0 - 1.2 mg/dL   GFR, Estimated 21 (L) >60 mL/min   Anion gap 8 5 - 15  Magnesium      Status: None   Collection Time: 09/29/23  5:07 AM  Result Value Ref Range   Magnesium  2.4 1.7 - 2.4 mg/dL   *Note: Due to a large number of results and/or encounters for the requested time period, some results have not been displayed. A complete set of results can be found in Results Review.    SUBJECTIVE:  No  acute events.  Ready to have packing removed.  OBJECTIVE:  GEN-  NAD sitting upright NOSE-  packing in place bilaterally with old blood on right, left clear.  Bilateral packing removed and nasal cavities evaluated.  No bleeding noted.  No prominent blood vessels noted.  IMPRESSION:  Epistaxis s/p AP packing bilaterally now doing well with packing removed  PLAN:  Doing well.  Follow up in 4 days at [facility].  Continue Saline nasal spray TID.  Norell Brisbin 09/29/2023, 8:18 AM

## 2023-09-29 NOTE — Progress Notes (Incomplete)
 Progress Note   Patient: [name]. FMW:[id number] DOB: 1960-11-03 DOA: 09/25/2023     4 DOS: the patient was seen and examined on 09/29/2023   Brief hospital course:  [name]. is a 63 y.o. male  who presents to the hospital due to nosebleed from a fall. He fell and landed on his face. Of note he is on anti-coagulation with eliquis  for afib. The ER doctor was dealing with persistent bleeding from the nose and as a result Dr. Milissa from ENT was consulted and saw the pt in the ER. Pt currently has packing in place and Dr. Milissa requested that the pt be placed in the hospital due to ongoing anemia and requirement for nasal packing.      07/04 -rapid response called due to change in mental status.  Patient noted to be minimally responsive but able to follow simple commands.  Had some right-sided weakness.  Code stroke called. MRI of the brain showed mild nonspecific sinus disease with mild air-fluid levels which can be seen in acute sinusitis, with no evidence of stroke. EEG was completed      07/05 -sitting up in a recliner.  Does not remember what happened yesterday.  Wife is at the bedside   07/06 -feels better.  Able to ambulate in the hall. No further unresponsive episodes.   7/7 EEG completed results pending.   Assessment and Plan:  Episode of unresponsiveness CVA ruled out Resolved. Unclear etiology.  No further episodes.  No evidence of stroke.  EEG is pending.  No evidence of infection.  BUN not significantly elevated from baseline. -F/u EEG -     ABLA due to Nose bleed  Patient admitted to the hospital for severe epistaxis in the setting of chronic anticoagulation use Appreciate ENT assistance.  Patient is status post nasal packing bilaterally Removed 7/7 AM Noted to have a drop in his H&H from 11.5 (1 month ago) to 7.5 >> 7.9 >> 8.2>7.8 No further episodes of bleeding and H&H is stable No indication for blood transfusion at this time -Continue  saline nasal spray TID       Heart failure with mid range EF RV failure  Ischemic CM. TTE 12/24 LVEF of 45 to 50 % low normal RV function  repeat echocardiogram this hospitalization shows an LVEF of 40 to 45%, l RV systolic function not well visualized   continue torsemide  80 mg twice daily On farxiga  and losartan  25mg  daily per last HF clinic note No spironolactone  d/t kidney dysfunction. No Beta blocker due to bradycardia.     Chronic atrial fibrillation Rate controlled.  On eliquis , held d/t epistaxis  Not on betablocker due to bradycardia.  Resume Eliquis  once seen by ENT in outpatient setting.   CAD Continue statins   HTN Blood pressure is stable   Continue torsemide  and resume low-dose losartan  in the a.m.   Renal transplant recipient (2012)  Continue tacrolimus  and prednisone    BPH Continue Flomax  0.4 mg PO daily     DM2 with complications of stage IV chronic kidney disease status post renal transplant - Renal function limits antidiabetes regimen. On lantus  20u at home. - On Mounjaro  and empagliflozin as well -Start semglee  15u nightly while inpatient  Pancytopenia Iron  deficiency anemia Most likely related to immunosuppression (tacrolimus ).  Worsening hemoglobin in the setting of acute blood loss anemia.  No iron  deficiency anemia as well. -Patient states that he receives iron  transfusions periodically in the outpatient setting  Depression Continue bupropion      Physical deconditioning Left shoulder fracture Appreciate PT input They recommend home health on discharge   Skin tear right buttock Right buttock hematoma Wound care consult   {Tip this will not be part of the note when signed Body mass index is 35.11 kg/m. , ,  (Optional):26781}  Subjective: No new complaint  Physical Exam: Vitals:   09/28/23 1611 09/29/23 0425 09/29/23 0827 09/29/23 1602  BP: (!) 131/59 136/70 107/65 (!) 144/68  Pulse: 72 63  72  Resp:  20 15 18   Temp: 98.1 F  (36.7 C) 98.1 F (36.7 C) 97.9 F (36.6 C) 98 F (36.7 C)  TempSrc: Oral     SpO2: 94% 94% 94% 92%  Weight:      Height:       HENT:     Head: Normocephalic.     Nose:     Comments: B/l nasal packing  Cardiovascular:     Rate and Rhythm: Normal rate.  Pulmonary:     Effort: Pulmonary effort is normal.  Abdominal:     Palpations: Abdomen is soft.  Musculoskeletal:        General: Normal range of motion.  Skin:    General: Skin is warm.     Comments: 2+ pitting edema b/l lower ext's  Neurological:     Mental Status: Awake, alert and oriented to person place and time. Psychiatric:     Normal mood and affect  Data Reviewed: White count 3.1, hemoglobin 8.2, platelet count 102, creatinine 3.63 Labs reviewed  Family Communication: Plan of care discussed with patient and his wife at the bedside.  They verbalized understanding and agree with the plan.  Disposition: Status is: Inpatient Remains inpatient appropriate because: Possible discharge in a.m.  Planned Discharge Destination: Home with Home Health {Tip this will not be part of the note when signed  DVT Prophylaxis  ., Scds  (Optional):26781}   Time spent: 35 minutes  Author: [name] Pepper, MD 09/29/2023 4:34 PM  For on call review [email or web address].

## 2023-09-30 ENCOUNTER — Telehealth: Payer: Self-pay | Admitting: Oncology

## 2023-09-30 DIAGNOSIS — Z9181 History of falling: Secondary | ICD-10-CM | POA: Diagnosis not present

## 2023-09-30 DIAGNOSIS — S42202D Unspecified fracture of upper end of left humerus, subsequent encounter for fracture with routine healing: Secondary | ICD-10-CM | POA: Diagnosis not present

## 2023-09-30 DIAGNOSIS — S42252D Displaced fracture of greater tuberosity of left humerus, subsequent encounter for fracture with routine healing: Secondary | ICD-10-CM | POA: Diagnosis not present

## 2023-09-30 NOTE — Progress Notes (Signed)
 Advanced Heart Failure Clinic Note   Date:  09/30/2023   ID:  [name]., DOB September 02, 1960, MRN [id number]   Provider location: [address] [place] Type of Visit:  Established patient  PCP:  [name], MD  Cardiologist:  Dr. Darron Nephrology: Dr. Joshua  HF Cardiologist: Dr. Rolan  Reason for Visit: f/u for HFmrEF   HPI: [name]. is a 63 y.o. male  with a history of CAD s/p CABG 2005, chronic atrial fibrillation on Eliquis , ESRD s/p renal transplant 2012, and chronic diastolic CHF.  His renal function has been fairly stable, most recent creatinine 1.43.  He has been struggling with volume overload/CHF.  He has a history of ischemic cardiomyopathy with EF as low as 35-40% in the past but echo in 3/20 showed EF 60-65%.  There is evidence for RV failure with pulmonary hypertension by echo.    Admitted 11/05/18 low grade fever and shortness of breath. HS Trop negative. Covid 19 negative. Blood CX negative. Placed on antibiotics and discharged to home the next day.   Admitted 05/03/19 with A/C Diastolic HF. Diuresed with IV lasix  and transitioned to torsemide  80/40 mg . Discharge weight 252 pounds.   Admitted 06/2019 with left arm pain. Left upper extremity venous Doppler revealed occluded cephalic vein at the AV fistula outflow tract in the antecubital fossa with no other thrombus identified. Vascular evaluated. He continued on Eliquis .  No plan for intervention. Also treated for acute gout flare. Discharged on prednisone  taper.     Given IV lasix  on 12/21 by HF Paramedicine.   Admitted to [facility] 2/22 with acute gout flare vs cellulitis. Nephrology was consulted to evaluate transplant regimen and follow along. He was treated with antibiotics and prednisone . Discharged on decreased torsemide  dose of 40 mg daily. Weight 259 lbs.  Admitted 4/22 for left leg cellulitis after sustaining a dog scratch, received IV antibiotics. No changes to his  diuretic therapy. Discharge weight 266 lbs.  Follow up with Dr. Darron, 1/23, he had CP and underwent Lexiscan  myoview , which overall was a low risk study with evidence of prior infarct in the infero-apical area with no significant change from before. Losartan  added to GDMT regimen.  Follow up 5/23, volume overloaded, REDs 40%. Advised taking additional metolazone  (3 doses that week), however labs showed worsening renal function and advised to keep at 2 doses.  Admitted in 5/23 with acute on chronic CHF and AKI.  Spiro, Farxiga  and losartan  stopped. Diuresed with IV lasix . Echo showed EF 40-45%, RV mildly reduced, GIIIDD (restrictive), IVC dilated. Nephrology consulted for diuretic dosing. Discharged home, weight 266 lbs.  He had chest pain in 8/23 and was admitted at [facility], minimal HS-TnI elevation with no trend, thought to be demand ischemia from volume overload.    Echo 8/23 showing EF 40-45%, diffuse hypokinesis with dyssynchrony, mildly dilated RV with mildly decreased systolic function, IVC dilated, moderate AS with mean gradient 15, mild-moderate mitral stenosis with mean gradient 6.   Follow up 03/27/22, volume overloaded with 25 lbs weight gain. Instructed to use Furoscix  x 3 days, then resume home dose of torsemide  + metolazone . Clinic weight 277 lbs.  Admitted from [facility] 04/01/22 with a/c CHF. Echo showed EF 40-45%, RV moderately reduced and RVSP 49 mmHg. RHC showed elevated filling pressures, moderate primarily pulmonary venous hypertension and preserved CO. Tadalafil  stopped with primarily PH group 2 and 3. He was diuresed with IV lasix  and metolazone , and GDMT  added back. Overall down 19 lbs. He was discharged home, weight 255 lbs.  Patient was admitted in 3/24 with Serratia bacteremia/pyelonephritis, treated with ertapenem .  He also had campylobacter diarrhea.  In 4/24, he tripped getting off his riding lawnmower and fell, fracturing ribs. He had a pulmonary contusion with this.   Patient  was admitted with slurred speech and profuse diarrhea in 11/24.  MRI brain showed no acute stroke.  He had AKI on CKD stage IV with creatinine up to 4.3, presentation thought to be due to diarrhea with dehydration related to use of Mounjaro .  He was given IV fluid and diuretics were stopped.  Losartan  and spironolactone  were stopped. He was sent home without diuretics.   Echo in 12/24 was stable with EF 45-50%, low normal RV function, mild MR.  Zio monitor for bradycardia in 12/24 showed average HR 52, no pauses, 4.7% PVCs.   Patient fell while on a cruise in 1/25 and broke ribs on the right (mechanical fall).  He was later admitted in 1/25 with influenza A and suspected development of secondary bacterial PNA.  6/25 had another mechanical fall and fractured his shoulder.   Recently admitted earlier this week after another mechanical fall. He developed subsequent severe persistent epistaxis post fall, prompting ED evaluation (in the setting of being anticoagulated w/ Eliquis ). He required nasal packing by ENT.  Initial head CT demonstrated small volume hemorrhage layering in the maxillary sinuses, and fluid in the visible pharynx. No acute intracranial abnormality. The following day, he had an acute change in mental status. He was noted to be minimally responsive but able to follow simple commands. Had some right-sided weakness. Code stroke called. Repeat head CT negative. MRI of the brain showed mild nonspecific sinus disease with mild air-fluid levels which can be seen in acute sinusitis, with no evidence of stroke. EEG without evidence of seizure  or epileptiform discharges. He was monitored further and had no other episodes and had resolution of right sided weakness. From a HF standpoint, he was noted to have mild b/l LEE on exam. Echo was done and LVEF was stable at 40 to 45%, RV systolic function not well visualized. Treated w/ Lasix . His Farxiga  and losartan  were initially held on admission given  elevation in SCr level, however both were resumed at discharge. He was also instructed to remain off of Eliquis  until f/u. Nasal packing removed prior to d/c.   He presents today for post hospital f/u. Still sore from his falls. Says his shoulder hurts. Denies further nose bleeding.   He also endorses a 6 lb wt gain post hospital. Up from 244 lb >>250 lb.  ReDs elevated today at 40%. Denies resting dyspnea but reports NYHA IIb-III symptoms + increased LEE. Compliant w/ torsemide  and has been taking metolazone  PRN but he notes his UOP is not as robust as before. He denies any further falls.       REDS clip 40%, abnormal   ECG (personally reviewed): chronic atrial fibrillation 67 bpm  Labs (1/24): K 4.6, creatinine 2.44 Labs (2/24): K 3.4, creatinine 2.75, LDL 25 Labs (4/24): K 4.1, creatinine 2.46 Labs (5/24): K 2.7, creatinine 2.25, IgG monoclonal protein on myeloma panel.  Labs (6/24): K 3.8, creatinine 2.58 Labs (9/24): BNP 265, K 4.1, creatinine 2.83, LFTs normal, hgb 13.6 Labs (11/24): K 3.3 => 3.7, creatinine 4.3 => 1.88 => 2.08 Labs (1/25): LDL 17, K 4.9, creatinine 3.3 Labs (5/25): BNP 241, K 3.8, creatinine 2.59  PMH: 1. CAD:  s/p CABG x 4 in [place]  in 2005.  - Cardiolite (1/20): EF 47%, small fixed apical septal defect, no ischemia.  Low risk study.  - Cardiolite (12/22): small apical inferior defect, no changes from prior, no ischemia. Low risk study. 2. Asthma 3. Gout 4. Atrial fibrillation: Chronic.  5. ESRD s/p renal transplantation at [facility] in 2012.  6. Anemia of chronic disease 7. Type II diabetes 8. HTN 9. Hyperlipidemia 10. OSA: On bipap.  11. Ischemic cardiomyopathy: EF 35-40% in past.  - Cardiolite (1/20) with EF 47%.  - Echo (1/20): EF 50-55%, mild MR, RV moderately dilated with moderately decreased systolic function, PASP 73 mmHg, moderate TR.   - Echo (3/20): EF 60-65%, PASP 75 mmHg, mildly dilated RV with mildly decreased systolic function.  - Echo  (2/21): EF 45-50%, global HK, moderately dilated RV with moderately decreased systolic function, D-shaped interventricular septum, dilated IVC.  - RHC (2/21): mean RA 19, PA 78/31 mean 51, mean PCWP 26, CI 3.26, PVR 3.25 - Echo (5/23): EF 40-45%, RV mildly reduced, GIIIDD (restrictive) - Echo (8/23): EF 40-45%, diffuse hypokinesis with dyssynchrony, mildly dilated RV with mildly decreased systolic function, IVC dilated, moderate AS with mean gradient 15, mild-moderate mitral stenosis with mean gradient 6.  - Echo (1/24): Echo showed EF 40-45%, RV moderately reduced and RVSP 49 mmHg, mild MR, no AS, dilated IVC.  - RHC (1/24): RA mean 13, PA 62/24 (mean 40), PCWP mean 20, CO/CI (Fick) 7.63/3.26, PVR 2.6 WU, PAPi 2.9 - Echo (12/24): EF 45-50%, low normal RV function, mild MR. 12. Obesity: s/p gastric bypass in 2015.  13. Bradycardia - Zio monitor (12/24): average HR 52, no pauses, 4.7% PVCs 14. Aortic stenosis: Moderate on 8/23 echo.  15. Mitral stenosis: Mild to moderate on 8/23 echo.  16. Rheumatoid arthritis 17. IgG monoclonal gammopathy 18. PAD: ABIs (10/24) with noncompressible ABIs, normal TBIs.  19. H/o Bell's palsy  Past Surgical History:  Procedure Laterality Date   CARDIAC CATHETERIZATION N/A 08/10/2002   CATARACT EXTRACTION W/PHACO Right 10/29/2017   Procedure: CATARACT EXTRACTION PHACO AND INTRAOCULAR LENS PLACEMENT (IOC)  RIGHT DIABETIC;  Surgeon: Mittie Gaskin, MD;  Location: [facility];  Service: Ophthalmology;  Laterality: Right   CATARACT EXTRACTION W/PHACO Left 11/18/2017   Procedure: CATARACT EXTRACTION PHACO AND INTRAOCULAR LENS PLACEMENT (IOC) LEFT IVA/TOPICAL;  Surgeon: Mittie Gaskin, MD;  Location: [facility];  Service: Ophthalmology;  Laterality: Left   COLONOSCOPY WITH PROPOFOL  N/A 04/22/2013   Procedure: COLONOSCOPY WITH PROPOFOL ;  Surgeon: Toribio SHAUNNA Cedar, MD;  Location: [facility];  Service: Endoscopy;  Laterality: N/A;   CORONARY  ANGIOPLASTY WITH STENT PLACEMENT N/A 1996   CORONARY ARTERY BYPASS GRAFT N/A 08/13/2002   Procedure: 4v CORONARY ARTERY BYPASS GRAFTING; Location: [facility]; Surgeon: Dessa Laine, MD   CYSTOSCOPY WITH INSERTION OF UROLIFT N/A 12/25/2021   Procedure: CYSTOSCOPY WITH INSERTION OF UROLIFT;  Surgeon: Twylla Glendia BROCKS, MD;  Location: [facility];  Service: Urology;  Laterality: N/A;   DG ANGIO AV SHUNT*L*     right and left upper arms   FASCIOTOMY  03/03/2012   Procedure: FASCIOTOMY;  Surgeon: Arley JONELLE Curia, MD;  Location: [facility];  Service: Orthopedics;  Laterality: Right;  FASCIOTOMY RIGHT SMALL FINGER   FASCIOTOMY Left 08/17/2013   Procedure: FASCIOTOMY LEFT RING;  Surgeon: Arley JONELLE Curia, MD;  Location: [facility];  Service: Orthopedics;  Laterality: Left;   INCISION AND DRAINAGE ABSCESS Left 10/15/2015   Procedure: INCISION AND  DRAINAGE ABSCESS;  Surgeon: Laneta JULIANNA Luna, MD;  Location: [facility];  Service: General;  Laterality: Left;   KIDNEY TRANSPLANT  09/13/2010   cadaver--at [facility]   LASER ABLATION CONDOLAMATA N/A 01/08/2023   Procedure: LASER REMOVAL ABLATION OF CONDYLOMATA;  Surgeon: Sheldon Standing, MD;  Location: [facility];  Service: General;  Laterality: N/A;  GEN AND LOCAL   RECTAL EXAM UNDER ANESTHESIA N/A 01/08/2023   Procedure: ANORECTAL EXAM UNDER ANESTHESIA;  Surgeon: Sheldon Standing, MD;  Location: [facility];  Service: General;  Laterality: N/A;   RIGHT HEART CATH N/A 11/15/2016   Procedure: RIGHT HEART CATH;  Surgeon: Darron Deatrice LABOR, MD;  Location: [facility];  Service: Cardiovascular;  Laterality: N/A;   RIGHT HEART CATH N/A 05/03/2019   Procedure: RIGHT HEART CATH;  Surgeon: [name], MD;  Location: [facility];  Service: Cardiovascular;  Laterality: N/A;   RIGHT HEART CATH N/A 04/04/2022   Procedure: RIGHT HEART CATH;  Surgeon: [name], MD;  Location: [facility] INVAS[facility] CV LAB;  Service: Cardiovascular;  Laterality: N/A;    ROUX-EN-Y GASTRIC BYPASS N/A 2015   TYMPANIC MEMBRANE REPAIR Left 03/2010   VASECTOMY     WART FULGURATION N/A 05/31/2022   Procedure: FULGURATION ANAL WART;  Surgeon: Tye Millet, DO;  Location: [facility];  Service: General;  Laterality: N/A;   Current Outpatient Medications  Medication Sig Dispense Refill   albuterol  (PROVENTIL ) (2.5 MG/3ML) 0.083% nebulizer solution Take 3 mLs (2.5 mg total) by nebulization every 6 (six) hours as needed for wheezing or shortness of breath. 150 mL 0   albuterol  (VENTOLIN  HFA) 108 (90 Base) MCG/ACT inhaler Inhale 2 puffs into the lungs every 6 (six) hours as needed for wheezing or shortness of breath. 6.7 g 5   [Paused] apixaban  (ELIQUIS ) 5 MG TABS tablet Take 1 tablet (5 mg total) by mouth 2 (two) times daily. 180 tablet 3   buPROPion  (WELLBUTRIN  SR) 150 MG 12 hr tablet Take 1 tablet (150 mg total) by mouth 2 (two) times daily. 180 tablet 3   Continuous Glucose Sensor (FREESTYLE LIBRE 3 SENSOR) MISC Use one every 2 weeks. 6 each 3   Cyanocobalamin  (B-12 PO) Take 1 tablet by mouth daily. (Patient not taking: Reported on 09/25/2023)     dapagliflozin  propanediol (FARXIGA ) 10 MG TABS tablet Take 1 tablet (10 mg total) by mouth daily before breakfast. 90 tablet 3   febuxostat  (ULORIC ) 40 MG tablet Take 3 tablets (120 mg total) by mouth daily. 90 tablet 5   fluticasone  (FLONASE ) 50 MCG/ACT nasal spray Place 2 sprays into both nostrils daily. 16 g 11   gabapentin  (NEURONTIN ) 300 MG capsule Take 1 capsule (300 mg total) by mouth 2 (two) times daily. 180 capsule 3   insulin  glargine-yfgn (SEMGLEE ) 100 UNIT/ML Pen Inject 20 Units into the skin daily. 45 mL 3   Insulin  Pen Needle (TECHLITE PEN NEEDLES) 31G X 5 MM MISC 4 (four) times daily. 400 each 3   Insulin  Pen Needle (UNIFINE PENTIPS) 31G X 5 MM MISC Use 4 times daily as directed 400 each 3   losartan  (COZAAR ) 25 MG tablet Take 1 tablet (25 mg total) by mouth daily. 90 tablet 3   metolazone  (ZAROXOLYN ) 2.5 MG tablet  Take 1 tablet (2.5 mg total) by mouth as directed. By the heart failure clinic 30 tablet 1   montelukast  (SINGULAIR ) 10 MG tablet Take 1 tablet (10 mg total) by mouth at bedtime. 30 tablet 11   mycophenolate  (  MYFORTIC ) 180 MG EC tablet Take 2 tablets (360 mg total) by mouth every morning AND 1 tablet (180 mg total) every evening. 360 tablet 3   omeprazole  (PRILOSEC) 20 MG capsule Take 1 capsule (20 mg total) by mouth daily. 30 capsule 11   oxyCODONE -acetaminophen  (PERCOCET) 5-325 MG tablet Take 2 tablets by mouth every 6 (six) hours as needed for severe pain (pain score 7-10). 16 tablet 0   potassium chloride  SA (KLOR-CON  M) 20 MEQ tablet Take 1 tablet (20 mEq total) by mouth daily as needed. 90 tablet 3   predniSONE  (DELTASONE ) 5 MG tablet Take 1 tablet (5 mg total) by mouth daily. 90 tablet 3   rosuvastatin  (CRESTOR ) 10 MG tablet Take 1 tablet (10 mg total) by mouth daily. 90 tablet 3   silver  sulfADIAZINE  (SILVADENE ) 1 % cream Apply 1 Application topically daily. 50 g 1   sodium chloride  (OCEAN) 0.65 % SOLN nasal spray Place 2 sprays into both nostrils every 8 (eight) hours for 10 days.     sulfamethoxazole -trimethoprim  (BACTRIM ) 400-80 MG tablet Take 1 tablet by mouth 3 (three) times a week. 36 tablet 3   tacrolimus  (PROGRAF ) 1 MG capsule Take 2 capsules (2 mg total) by mouth every morning AND 1 capsule (1 mg total) every evening. 90 capsule 11   tadalafil  (CIALIS ) 5 MG tablet Take 1 tablet (5 mg total) by mouth daily as needed for erectile dysfunction. 90 tablet 0   tamsulosin  (FLOMAX ) 0.4 MG CAPS capsule Take 1 capsule (0.4 mg total) by mouth daily. 30 capsule 11   Testosterone  20.25 MG/ACT (1.62%) GEL Place 2 Pump onto the skin daily. 150 g 3   tirzepatide  (MOUNJARO ) 5 MG/0.5ML Pen Inject 5 mg into the skin once a week. 2 mL 5   tiZANidine  (ZANAFLEX ) 2 MG tablet Take 1 tablet (2 mg total) by mouth every 6 (six) hours as needed for muscle spasms. 30 tablet 1   torsemide  (DEMADEX ) 20 MG tablet  Take 4 tablets (80 mg total) by mouth 2 (two) times daily. 240 tablet 3   Zinc  50 MG TABS Take 1 tablet (50 mg total) by mouth daily. 100 tablet 3   No current facility-administered medications for this visit.   Allergies:   Iodinated contrast media, Iodine, Acetaminophen , Nsaids, and Ibuprofen   Social History:  The patient  reports that he quit smoking about 29 years ago. His smoking use included cigars. He has never been exposed to tobacco smoke. He has never used smokeless tobacco. He reports that he does not currently use alcohol. He reports that he does not use drugs.   Family History:  The patient's family history includes Breast cancer in his maternal grandmother; Diabetes in his maternal grandmother; Heart disease in his father; Kidney disease in his father; Kidney failure in his father; Liver cancer in his paternal grandmother and paternal uncle; Valvular heart disease in his mother.   ROS:  Please see the history of present illness.   All other systems are personally reviewed and negative.   Wt Readings from Last 3 Encounters:  09/25/23 111 kg (244 lb 11.4 oz)  08/21/23 111.1 kg (245 lb)  08/15/23 112.5 kg (248 lb)    There were no vitals taken for this visit. PHYSICAL EXAM: General:  chronically ill appearing, moderately obese, No respiratory difficulty HEENT: normal Neck: supple. JVD 8-9 cm. Carotids 2+ bilat; no bruits. No lymphadenopathy or thyromegaly appreciated. Cor: PMI nondisplaced. Irregularly irregular rate and rhtyhm. Lungs: decreased BS at the bases  Abdomen: soft, nontender, nondistended. No hepatosplenomegaly. No bruits or masses. Good bowel sounds. Extremities: no cyanosis, clubbing, rash, 2+ b/l LE edema w/ chronic venous stasis dermatitis  Neuro: alert & oriented x 3, cranial nerves grossly intact. moves all 4 extremities w/o difficulty. Affect pleasant.   Assessment & Plan: 1. Chronic heart failure with Mid Range EF/ With prominent RV failure/pulmonary  hypertension:  Ischemic cardiomyopathy.  ?If RV failure is related to severe OSA, he was a remote smoker. Echo in 2/21 with EF 45-50%, moderately dilated RV with moderately decreased systolic function, moderate pulmonary hypertension. Echo in 12/24 showed EF 45-50%, low normal RV function, mild MR. Echo 7/25 EF 40-45%, RV not well visualized. Management complicated by CKD stage 4.  Worsening NYHA Class IIb-III symptoms in the setting of volume overload. Edematous on exam. Wt up 6 lb. ReDs elevated at 40%. Currently taking torsemide  80 mg bid w/ drop in UOP  - Increase torsemide  to 100 mg bid x 2 days, then reduce to 100 mg qam/80 mg qpm  - take 2.5 mg of metolazone  today x 1 dose, then back to PRN  - obtain BMP today and again in 7 days  - Continue losartan  25 mg daily.  - Not on spironolactone  with CKD stage IV.  - Continue Farxiga  10 mg daily. Will need to watch closely for further UTIs.   2. Atrial fibrillation: Chronic. Rate controlled.  - he has had no further nose bleeding, restart Eliquis  for stroke prophylaxis however would like to eventually get him off anticoagulation given numerous mechanical falls. 4/24 he fell getting off his riding lawnmower, fracturing ribs. 1/25 fell while on a cruise and broke ribs on the right. 6/25 mechanical fall resulting in left shoulder fx, 7/25 mechanical fall w/ subsequent severe epistaxis requiring nasal packing. I will refer to EP for consideration for Watchman (Dr. Cindie). He has been advised to use extreme caution w/ ambulation   - Not on beta blocker with h/o bradycardia.   3. CAD: S/p CABG 2005.  Cardiolite 12/22 with prior MI, no ischemia. High threshold for cath given CKD. He denies chest pain  - No ASA given Eliquis  use.  - Continue Crestor  10 mg daily. Good lipids in 1/25. Repeat next visit  4. CKD Stage 4: S/p renal transplantation in 2012. [facility]. He remains on mycophenolate  and tacrolimus .   - continue SGLT2i   - obtain BMP today and again in 7 days w/ diuretic increase  5. OSA: reports compliance w/ BiPAP, continue  6. HTN: Controlled on current regimen  - Continue losartan  7. DM 2: On insulin . Managed by Endocrinology. - Continue dapagliflozin  for now but watch for further GU symptoms. - He is on Mounjaro  and losing wt   8. Pulmonary hypertension: Primarily pulmonary venous hypertension by RHC in 1/24 though there may be group 3 PH component from OSA.  9. Chronic Anemia: Gets iron  infusions.  10. IgG monoclonal protein: followed by hematologist.  11. Bradycardia: Suspect tachy-brady syndrome. HR generally in 50s-60s.  Zio monitor in 12/24 did not show worrisome bradyarrhythmias (avg HR in 50s, no long pauses). He denies any syncope or dizziness associated w/ recent falls (all seem strictly mechanical)  - No nodal blockers.  - May need pacemaker in future.   Refer to EP for Watchman. F/u w/ APP in 3-4 wks to reassess volume status.     Caffie Shed, PA-C  09/30/2023

## 2023-09-30 NOTE — Telephone Encounter (Signed)
 Pt spouse called and states the pt was in the hospital and would like for you to look over his lab results. Please advise any scheduling needs.

## 2023-09-30 NOTE — Telephone Encounter (Signed)
 She can be seen by lauren in about 2 weeks from now and receive iv iron  on that day.

## 2023-10-01 ENCOUNTER — Ambulatory Visit (INDEPENDENT_AMBULATORY_CARE_PROVIDER_SITE_OTHER)
Admission: RE | Admit: 2023-10-01 | Discharge: 2023-10-01 | Disposition: A | Discharge status: Home / Self Care | Source: Ambulatory Visit | Attending: Internal Medicine | Admitting: Internal Medicine

## 2023-10-01 ENCOUNTER — Ambulatory Visit: Payer: Self-pay | Admitting: Internal Medicine

## 2023-10-01 ENCOUNTER — Ambulatory Visit (INDEPENDENT_AMBULATORY_CARE_PROVIDER_SITE_OTHER): Admitting: Internal Medicine

## 2023-10-01 ENCOUNTER — Encounter: Payer: Self-pay | Admitting: Internal Medicine

## 2023-10-01 ENCOUNTER — Other Ambulatory Visit: Payer: Self-pay

## 2023-10-01 VITALS — BP 120/78 | HR 67 | Temp 97.8°F | Ht 70.0 in | Wt 247.0 lb

## 2023-10-01 DIAGNOSIS — S300XXA Contusion of lower back and pelvis, initial encounter: Secondary | ICD-10-CM | POA: Diagnosis not present

## 2023-10-01 DIAGNOSIS — M25522 Pain in left elbow: Secondary | ICD-10-CM

## 2023-10-01 DIAGNOSIS — R040 Epistaxis: Secondary | ICD-10-CM | POA: Diagnosis not present

## 2023-10-01 DIAGNOSIS — S42402A Unspecified fracture of lower end of left humerus, initial encounter for closed fracture: Secondary | ICD-10-CM | POA: Diagnosis not present

## 2023-10-01 DIAGNOSIS — I4821 Permanent atrial fibrillation: Secondary | ICD-10-CM | POA: Diagnosis not present

## 2023-10-01 DIAGNOSIS — S4292XD Fracture of left shoulder girdle, part unspecified, subsequent encounter for fracture with routine healing: Secondary | ICD-10-CM | POA: Diagnosis not present

## 2023-10-01 MED ORDER — HYDROCODONE-ACETAMINOPHEN 5-325 MG PO TABS
1.0000 | ORAL_TABLET | Freq: Four times a day (QID) | ORAL | 0 refills | Status: DC | PRN
  Filled 2023-10-01 – 2023-10-06 (×2): qty 60, 15d supply, fill #0

## 2023-10-01 NOTE — Assessment & Plan Note (Signed)
 Asked him to restart full dose eliquis  now Going back to cardiology today

## 2023-10-01 NOTE — Progress Notes (Signed)
 Subjective:    Patient ID: [name]., male    DOB: [birth year], 63 y.o.   MRN: [id number]  HPI Here for hospital follow up  With wife  [name] walking up steps on porch Right onto nose----had bleeding that he couldn't control Plugs inserted in ER and ENT recommended hospital observation Did have change in responsiveness during stay---rapid response. Right side weakness He doesn't really remember this (wife was the one who found it) He didn't use CPAP in hospital due to the nasal issues--unclear what happened MRI was reassuring and EEG normal  Plugs out of nose No bleeding and plugs were dry when they were removed  Off the eliquis  since the severe bleeding Going back to Dr Rolan tomorrow Discussed going back on full dose now  Hematoma/bruising on buttock is affecting skin  Current Outpatient Medications on File Prior to Visit  Medication Sig Dispense Refill   albuterol  (PROVENTIL ) (2.5 MG/3ML) 0.083% nebulizer solution Take 3 mLs (2.5 mg total) by nebulization every 6 (six) hours as needed for wheezing or shortness of breath. 150 mL 0   albuterol  (VENTOLIN  HFA) 108 (90 Base) MCG/ACT inhaler Inhale 2 puffs into the lungs every 6 (six) hours as needed for wheezing or shortness of breath. 6.7 g 5   buPROPion  (WELLBUTRIN  SR) 150 MG 12 hr tablet Take 1 tablet (150 mg total) by mouth 2 (two) times daily. 180 tablet 3   Continuous Glucose Sensor (FREESTYLE LIBRE 3 SENSOR) MISC Use one every 2 weeks. 6 each 3   Cyanocobalamin  (B-12 PO) Take 1 tablet by mouth daily.     dapagliflozin  propanediol (FARXIGA ) 10 MG TABS tablet Take 1 tablet (10 mg total) by mouth daily before breakfast. 90 tablet 3   febuxostat  (ULORIC ) 40 MG tablet Take 3 tablets (120 mg total) by mouth daily. 90 tablet 5   fluticasone  (FLONASE ) 50 MCG/ACT nasal spray Place 2 sprays into both nostrils daily. 16 g 11   gabapentin  (NEURONTIN ) 300 MG capsule Take 1 capsule (300 mg total) by mouth 2 (two) times daily.  180 capsule 3   insulin  glargine-yfgn (SEMGLEE ) 100 UNIT/ML Pen Inject 20 Units into the skin daily. 45 mL 3   Insulin  Pen Needle (TECHLITE PEN NEEDLES) 31G X 5 MM MISC 4 (four) times daily. 400 each 3   Insulin  Pen Needle (UNIFINE PENTIPS) 31G X 5 MM MISC Use 4 times daily as directed 400 each 3   losartan  (COZAAR ) 25 MG tablet Take 1 tablet (25 mg total) by mouth daily. 90 tablet 3   metolazone  (ZAROXOLYN ) 2.5 MG tablet Take 1 tablet (2.5 mg total) by mouth as directed. By the heart failure clinic 30 tablet 1   montelukast  (SINGULAIR ) 10 MG tablet Take 1 tablet (10 mg total) by mouth at bedtime. 30 tablet 11   mycophenolate  (MYFORTIC ) 180 MG EC tablet Take 2 tablets (360 mg total) by mouth every morning AND 1 tablet (180 mg total) every evening. 360 tablet 3   omeprazole  (PRILOSEC) 20 MG capsule Take 1 capsule (20 mg total) by mouth daily. 30 capsule 11   potassium chloride  SA (KLOR-CON  M) 20 MEQ tablet Take 1 tablet (20 mEq total) by mouth daily as needed. 90 tablet 3   predniSONE  (DELTASONE ) 5 MG tablet Take 1 tablet (5 mg total) by mouth daily. 90 tablet 3   rosuvastatin  (CRESTOR ) 10 MG tablet Take 1 tablet (10 mg total) by mouth daily. 90 tablet 3   silver  sulfADIAZINE  (SILVADENE ) 1 % cream Apply  1 Application topically daily. 50 g 1   sodium chloride  (OCEAN) 0.65 % SOLN nasal spray Place 2 sprays into both nostrils every 8 (eight) hours for 10 days.     sulfamethoxazole -trimethoprim  (BACTRIM ) 400-80 MG tablet Take 1 tablet by mouth 3 (three) times a week. 36 tablet 3   tacrolimus  (PROGRAF ) 1 MG capsule Take 2 capsules (2 mg total) by mouth every morning AND 1 capsule (1 mg total) every evening. 90 capsule 11   tadalafil  (CIALIS ) 5 MG tablet Take 1 tablet (5 mg total) by mouth daily as needed for erectile dysfunction. 90 tablet 0   tamsulosin  (FLOMAX ) 0.4 MG CAPS capsule Take 1 capsule (0.4 mg total) by mouth daily. 30 capsule 11   Testosterone  20.25 MG/ACT (1.62%) GEL Place 2 Pump onto the  skin daily. 150 g 3   tirzepatide  (MOUNJARO ) 5 MG/0.5ML Pen Inject 5 mg into the skin once a week. 2 mL 5   tiZANidine  (ZANAFLEX ) 2 MG tablet Take 1 tablet (2 mg total) by mouth every 6 (six) hours as needed for muscle spasms. 30 tablet 1   torsemide  (DEMADEX ) 20 MG tablet Take 4 tablets (80 mg total) by mouth 2 (two) times daily. 240 tablet 3   Zinc  50 MG TABS Take 1 tablet (50 mg total) by mouth daily. 100 tablet 3   [Paused] apixaban  (ELIQUIS ) 5 MG TABS tablet Take 1 tablet (5 mg total) by mouth 2 (two) times daily. (Patient not taking: Reported on 10/01/2023) 180 tablet 3   No current facility-administered medications on file prior to visit.    Allergies  Allergen Reactions   Iodinated Contrast Media Other (See Comments)    Kidney transplant  Iodinated contrast media (substance)   Iodine Other (See Comments)    Kidney transplant   Acetaminophen  Other (See Comments)    BC of transplant  acetaminophen    Nsaids Other (See Comments)    Kidney transplant   Ibuprofen Other (See Comments)    Due to kidney transplant    Past Medical History:  Diagnosis Date   Anal condyloma    Anemia    Aortic atherosclerosis (HCC)    Aortic stenosis    a.) TTE 10/2021: Mod AS. AVA 1.06cm^2 (VTI). Mean grad ; b.) TTE 04/02/2022: no AV stenosis   Asthma, persistent controlled 02/25/2013   Attention or concentration deficit 04/26/2021   BPH with obstruction/lower urinary tract symptoms 09/25/2014   CAD (coronary artery disease) 2005   a.) LHC 1996 -> HG stenosis mLAD -> PTCA/PCI with stent x 1 (unk type) -> complicated by CFA pseudoanurysm (required surg);  b.) LHC 08/10/2002  -> 95% LAD, 70% ISR LAD, 80% pOM, 70% mRCA --> CVTS consult; c.) 4v CABG 08/13/2002; c.) 03/2018 MV: Small, fixed inferoapical and apical sep defect. No isc. EF 47% (60-65% by 05/2018 TTE); d.) 02/2021 MV: small Apical inf fixed defect. No isc -> low risk.   Cardiomegaly    Cellulitis and abscess of left leg 07/22/2020    Chronic atrial fibrillation    a.) CHA2DS2VASc = 4 (CHF, HTN, vascular disease history, T2DM);  b.) rate/rhythm maintained without pharmacological intervention; chronically anticoagulated with apixaban    Chronic combined systolic and diastolic CHF (congestive heart failure)    a.) TTE 7/18 : EF 45-50%; b.) TTE 05/2018 : EF 60-65%, RVSP 74.8; c.) TTE 12/2018: EF 50-55%. Sev dil LA; d.) TTE 04/2019: EF 45-50%; e.) TTE 07/2021: EF 40-45%, G3DD; f.) TTE 10/2021: EF 40-45%, mild conc LVH, septal-lat dyssynchrony (LBBB), mildly red  RVSF, mild-mod dil LA, mildly dil RA, mild-mod MS, mod AS; g.) TTE 04/02/2022: EF 40-45%, glob HK, LVH, red RVSF, RVE, sev LAE, mild-mod RAE, mild MR   Chronic venous stasis dermatitis of both lower extremities    Cytomegaloviral disease 2017   Diverticulosis of colon 04/22/2013   Dyspnea    Elevated RIGHT hemidiaphragm    Erectile dysfunction    a.) on topical TRT + Trimix (alprostadil/papaverine/phentolamine) injections   ESRD (end stage renal disease) (HCC)    a.) dialysis dependent 2004-2012; b.) s/p cadaveric RIGHT renal transplant 09/13/2010   Essential hypertension 12/17/2010   GERD (gastroesophageal reflux disease)    Gout    History of 2019 novel coronavirus disease (COVID-19) 06/30/2019   History of bilateral cataract extraction 10/2017   History of renal transplant 09/13/2010   a.) s/p cadaveric donor transplant 09/13/2010   Hx of bilateral cataract extraction 10/2017   Hyperlipidemia    Hypogonadism in male 09/25/2014   Ischemic cardiomyopathy    Left cephalic vein thrombosis 06/2019   Long term current use of anticoagulant    a.) apixaban    Long term current use of immunosuppressive drug    a.) mycophenolate  + prednisone  + tacrolimus    Major depressive disorder 10/04/2013   Mitral stenosis 11/07/2021   a.) TTE 11/07/2021: severe MAC, mild-mod MS (MPG 6 mmHg); b.) TTE 04/02/2022: no MV stenosis   Murmur    Obesity hypoventilation syndrome 03/31/2012    OSA treated with BiPAP 09/29/2012   PAH (pulmonary artery hypertension)    a. 04/2019 RHC: RA 19, RV 80/20, PA 78/31 (51), PCWP 25, CO/CI 7.69/3.26. PVR 3.25 --> Sev PAH, likely primarily PV HTN; b.) RHC 04/04/2022: mRA 13, mPA 40, mPCWP 20, PA sat 59, AO sat 92, CO 7.63, CI 3.26, PVR 2.6 --> mod portal venous HTN   Pancreatitis    S/P CABG x 4 08/13/2002   a.) LIMA-LAD, LRA-OM, SVG-D1, SVG-dRCA   S/P gastric bypass    Synovitis of finger 06/11/2019   Trigger finger, unspecified little finger 12/23/2018   Type 2 diabetes mellitus with hyperglycemia, with long-term current use of insulin  09/09/2014   a.) has FreeStyle Libre CGM   Ulcer    Left shin goes to wound care center at [facility] qweek   Wears glasses    Wears hearing aid in both ears     Past Surgical History:  Procedure Laterality Date   CARDIAC CATHETERIZATION N/A 08/10/2002   CATARACT EXTRACTION W/PHACO Right 10/29/2017   Procedure: CATARACT EXTRACTION PHACO AND INTRAOCULAR LENS PLACEMENT (IOC)  RIGHT DIABETIC;  Surgeon: Mittie Gaskin, MD;  Location: [facility];  Service: Ophthalmology;  Laterality: Right   CATARACT EXTRACTION W/PHACO Left 11/18/2017   Procedure: CATARACT EXTRACTION PHACO AND INTRAOCULAR LENS PLACEMENT (IOC) LEFT IVA/TOPICAL;  Surgeon: Mittie Gaskin, MD;  Location: [facility];  Service: Ophthalmology;  Laterality: Left   COLONOSCOPY WITH PROPOFOL  N/A 04/22/2013   Procedure: COLONOSCOPY WITH PROPOFOL ;  Surgeon: Toribio SHAUNNA Cedar, MD;  Location: [facility];  Service: Endoscopy;  Laterality: N/A;   CORONARY ANGIOPLASTY WITH STENT PLACEMENT N/A 1996   CORONARY ARTERY BYPASS GRAFT N/A 08/13/2002   Procedure: 4v CORONARY ARTERY BYPASS GRAFTING; Location: [facility]; Surgeon: Dessa Laine, MD   CYSTOSCOPY WITH INSERTION OF UROLIFT N/A 12/25/2021   Procedure: CYSTOSCOPY WITH INSERTION OF UROLIFT;  Surgeon: Twylla Glendia BROCKS, MD;  Location: [facility];  Service: Urology;  Laterality: N/A;    DG ANGIO AV SHUNT*L*     right and left upper  arms   FASCIOTOMY  03/03/2012   Procedure: FASCIOTOMY;  Surgeon: Arley JONELLE Curia, MD;  Location: [facility];  Service: Orthopedics;  Laterality: Right;  FASCIOTOMY RIGHT SMALL FINGER   FASCIOTOMY Left 08/17/2013   Procedure: FASCIOTOMY LEFT RING;  Surgeon: Arley JONELLE Curia, MD;  Location: [facility];  Service: Orthopedics;  Laterality: Left;   INCISION AND DRAINAGE ABSCESS Left 10/15/2015   Procedure: INCISION AND DRAINAGE ABSCESS;  Surgeon: Laneta JULIANNA Luna, MD;  Location: [facility];  Service: General;  Laterality: Left;   KIDNEY TRANSPLANT  09/13/2010   cadaver--at [facility]   LASER ABLATION CONDOLAMATA N/A 01/08/2023   Procedure: LASER REMOVAL ABLATION OF CONDYLOMATA;  Surgeon: Sheldon Standing, MD;  Location: [facility];  Service: General;  Laterality: N/A;  GEN AND LOCAL   RECTAL EXAM UNDER ANESTHESIA N/A 01/08/2023   Procedure: ANORECTAL EXAM UNDER ANESTHESIA;  Surgeon: Sheldon Standing, MD;  Location: [facility];  Service: General;  Laterality: N/A;   RIGHT HEART CATH N/A 11/15/2016   Procedure: RIGHT HEART CATH;  Surgeon: Darron Deatrice LABOR, MD;  Location: [facility];  Service: Cardiovascular;  Laterality: N/A;   RIGHT HEART CATH N/A 05/03/2019   Procedure: RIGHT HEART CATH;  Surgeon: [name], MD;  Location: [facility];  Service: Cardiovascular;  Laterality: N/A;   RIGHT HEART CATH N/A 04/04/2022   Procedure: RIGHT HEART CATH;  Surgeon: [name], MD;  Location: [facility];  Service: Cardiovascular;  Laterality: N/A;   ROUX-EN-Y GASTRIC BYPASS N/A 2015   TYMPANIC MEMBRANE REPAIR Left 03/2010   VASECTOMY     WART FULGURATION N/A 05/31/2022   Procedure: FULGURATION ANAL WART;  Surgeon: Tye Millet, DO;  Location: [facility];  Service: General;  Laterality: N/A;    Family History  Problem Relation Age of Onset   Heart disease Father    Kidney failure Father    Kidney disease Father    Diabetes  Maternal Grandmother    Breast cancer Maternal Grandmother    Valvular heart disease Mother    Liver cancer Paternal Uncle    Liver cancer Paternal Grandmother    Prostate cancer Neg Hx     Social History   Socioeconomic History   Marital status: Married    Spouse name: Not on file   Number of children: 2   Years of education: 14   Highest education level: Associate degree: academic program  Occupational History   Occupation: Presenter, broadcasting: unemployed    Comment: disabled due to kidney failure  Tobacco Use   Smoking status: Former    Types: Cigars    Quit date: 09/02/1994    Years since quitting: 29.0    Passive exposure: Never   Smokeless tobacco: Never  Vaping Use   Vaping status: Never Used  Substance and Sexual Activity   Alcohol use: Not Currently    Comment: occ   Drug use: No   Sexual activity: Not on file  Other Topics Concern   Not on file  Social History Narrative   Has living will   Wife is health care POA---son Donnice is alternate   Would accept resuscitation attempts   No tube feedings if cognitively unaware   Social Drivers of Health   Financial Resource Strain: Not on file  Food Insecurity: No Food Insecurity (09/25/2023)   Hunger Vital Sign    Worried About Running Out of Food in the Last Year: Never true  Ran Out of Food in the Last Year: Never true  Transportation Needs: No Transportation Needs (09/25/2023)   PRAPARE - Administrator, Civil Service (Medical): No    Lack of Transportation (Non-Medical): No  Physical Activity: Not on file  Stress: Not on file  Social Connections: Socially Integrated (04/11/2023)   Social Connection and Isolation Panel    Frequency of Communication with Friends and Family: More than three times a week    Frequency of Social Gatherings with Friends and Family: More than three times a week    Attends Religious Services: More than 4 times per year    Active Member of [employer] or  Organizations: Yes    Attends Engineer, structural: More than 4 times per year    Marital Status: Married  Catering manager Violence: Not At Risk (09/25/2023)   Humiliation, Afraid, Rape, and Kick questionnaire    Fear of Current or Ex-Partner: No    Emotionally Abused: No    Physically Abused: No    Sexually Abused: No   Review of Systems Shoulder is improving---but now worried about his elbow (still hurts a lot) Still needs the oxycodone /hydrocodone --but less (especially at night) Appetite is not great--but eat some     Objective:   Physical Exam Musculoskeletal:     Comments: Left elbow is swollen and extremely tender Resists ROM   Skin:    Comments: Large hematoma on left buttock with single small ulcerated area with eschar            Assessment & Plan:

## 2023-10-01 NOTE — Assessment & Plan Note (Addendum)
 Was not evaluated when fractured shoulder found Will check x-ray now--no clear fracture Discussed gently ROM exercises and reviewing his elbow at the next ortho visit

## 2023-10-01 NOTE — Discharge Summary (Signed)
 Physician Discharge Summary   Patient: [name]. MRN: [id number] DOB: Dec 26, 1960  Admit date:     09/25/2023  Discharge date: 09/29/2023  Discharge Physician: [name] Pepper   PCP: [name], MD   Recommendations at discharge:    BMP to ensure renal function returning to baseline CBC to ensure cell counts stable, f/u with hematology oncology   Discharge Diagnoses: Principal Problem:   Bleeding from the nose  Resolved Problems:   * No resolved hospital problems. *  Hospital Course: [name]. is a 63 y.o. male  who presents to the hospital due to nosebleed from a fall. He fell and landed on his face. Of note he is on anti-coagulation with eliquis  for afib. The ER doctor was dealing with persistent bleeding from the nose and as a result Dr. Milissa from ENT was consulted and saw the pt in the ER. Pt currently has packing in place and Dr. Milissa requested that the pt be placed in the hospital due to ongoing anemia and requirement for nasal packing.      07/04 -rapid response called due to change in mental status.  Patient noted to be minimally responsive but able to follow simple commands.  Had some right-sided weakness.  Code stroke called. MRI of the brain showed mild nonspecific sinus disease with mild air-fluid levels which can be seen in acute sinusitis, with no evidence of stroke. EEG was completed      07/05 -sitting up in a recliner.  Does not remember what happened yesterday.  Wife is at the bedside     07/06 -feels better.  Able to ambulate in the hall. No further unresponsive episodes.     7/7 EEG without evidence of seizure  or epileptiform discharges. This was a spot EEG, but he had no further episodes of unresponsiveness and continuous EEG was not pursued.      No sob at rest or with activity. Walked with PT. Has some LE edema and discoloration of bilateral lower extremities.   Assessment and Plan: Episode of unresponsiveness CVA  ruled out Occurred 7/4. Patient was altered but able to follow simple commands. Noted to have R sided weakness. MRI of the brain showed no evidence of stroke. EEG obtained 7/7 showed no evidence of seizure. He had no further episodes during this hospitalization.  His BUN was not significantly increased from 1 month ago. He also had no other electrolyte abnormalities to suggest an etiology for this episode. Neurology was consulted on 7/4, no further work up was pursued this hospitalization and he will follow up with his PCP.     ABLA due to epistaxis  Patient admitted to the hospital for severe epistaxis in the setting of chronic anticoagulation use. Patient underwent nasal packing bilaterally with ENT. This was removed 7/7 AM and patient had no further episodes of epistaxis.  Noted to have a drop in his H&H from 11.5 (1 month ago) to 7.5 >> 7.9 >> 8.2>7.8. But hemoglobin remained stable on discharged.    Patient was discharged with instructions to continue saline nasal sprays and follow up with ENT.      Heart failure with mid range EF RV failure  Patient had no dyspnea at rest or with ambulating. He did have some LE edema, with evidence of chronic venous insufficiency (discoloration of BLE).  Ischemic CM. TTE 12/24 LVEF of 45 to 50 % low normal RV function  Repeat echocardiogram this hospitalization shows an LVEF of 40 to  45%, RV systolic function not well visualized    His home torsemide  80 mg twice daily was briefly continued. His farxiga  and losartan  were held with the elevation of his serum creatinine but these were resumed on discharge given improvement in his serum creatinine.   GDMT limited:  No spironolactone  d/t kidney dysfunction. No Beta blocker due to bradycardia.      Chronic atrial fibrillation Rate controlled.  On eliquis , held d/t epistaxis. Resume this once follow up with PCP, ensure no further bleeding and hemoglobin stable.    CAD Continued on statin.   HTN Blood  pressure is stable   Continue torsemide  and losartan  was resumed on discharge.    Renal transplant recipient (2012)  Continue tacrolimus  and prednisone    BPH Continue Flomax  0.4 mg PO daily    DM2 with complications of stage IV chronic kidney disease status post renal transplant - Renal function limits antidiabetes regimen. On lantus  20u at home. - On Mounjaro  and empagliflozin as well -Home medications resumed on discharge. Held meal time dose, unclear what patient taking at home. Will have close follow up with PCP for further titration of this.    Pancytopenia Iron  deficiency anemia Most likely related to immunosuppression (tacrolimus ).  Worsening hemoglobin in the setting of acute blood loss anemia.  Known iron  deficiency anemia as well. -Patient states that he receives iron  transfusions periodically in the outpatient setting, will schedule IV iron  with hematology.  -Will have repeat CBC in the outpatient to ensure hemoglobin is stable.    Depression Buproprion was continued.      Physical deconditioning Left shoulder fracture Patient in sling. Will have HH on discharge.      Skin tear right buttock Right buttock hematoma Wound care instructions were given. Patient will have home health for assistance with wound care.         Consultants: ENT Procedures performed: None  Disposition: Home health Diet recommendation:  Cardiac and Carb modified diet DISCHARGE MEDICATION: Allergies as of 09/29/2023       Reactions   Iodinated Contrast Media Other (See Comments)   Kidney transplant Iodinated contrast media (substance)   Iodine Other (See Comments)   Kidney transplant   Acetaminophen  Other (See Comments)   BC of transplant acetaminophen    Nsaids Other (See Comments)   Kidney transplant   Ibuprofen Other (See Comments)   Due to kidney transplant        Medication List     PAUSE taking these medications    Eliquis  5 MG Tabs tablet Wait to take this until  your doctor or other care provider tells you to start again. Generic drug: apixaban  Take 1 tablet (5 mg total) by mouth 2 (two) times daily.       STOP taking these medications    azithromycin  500 MG tablet Commonly known as: ZITHROMAX    benzonatate  200 MG capsule Commonly known as: TESSALON    colchicine  0.6 MG tablet   HYDROcodone -acetaminophen  5-325 MG tablet Commonly known as: NORCO/VICODIN   ofloxacin  0.3 % ophthalmic solution Commonly known as: OCUFLOX    tadalafil  (PAH) 20 MG tablet Commonly known as: ADCIRCA        TAKE these medications    albuterol  (2.5 MG/3ML) 0.083% nebulizer solution Commonly known as: PROVENTIL  Inhale one vial (3 ml) via nebulizer every 6 hours as needed for wheezing or shortness of breath (Take 3 mLs (2.5 mg total) by nebulization every 6 (six) hours as needed for wheezing or shortness of breath.)   albuterol  108 (  90 Base) MCG/ACT inhaler Commonly known as: VENTOLIN  HFA Inhale 2 puffs into the lungs every 6 (six) hours as needed for wheezing or shortness of breath.   B-12 PO Take 1 tablet by mouth daily.   buPROPion  150 MG 12 hr tablet Commonly known as: WELLBUTRIN  SR Take 1 tablet (150 mg total) by mouth 2 (two) times daily.   Farxiga  10 MG Tabs tablet Generic drug: dapagliflozin  propanediol Take 1 tablet (10 mg total) by mouth daily before breakfast.   febuxostat  40 MG tablet Commonly known as: ULORIC  Take 3 tablets (120 mg total) by mouth daily.   fluticasone  50 MCG/ACT nasal spray Commonly known as: FLONASE  Place 2 sprays into both nostrils daily.   FreeStyle Libre 3 Sensor Misc Use one every 2 weeks.   gabapentin  300 MG capsule Commonly known as: NEURONTIN  Take 1 capsule (300 mg total) by mouth 2 (two) times daily.   losartan  25 MG tablet Commonly known as: COZAAR  Take 1 tablet (25 mg total) by mouth daily.   metolazone  2.5 MG tablet Commonly known as: ZAROXOLYN  Take 1 tablet (2.5 mg total) by mouth as directed.  By the heart failure clinic   montelukast  10 MG tablet Commonly known as: SINGULAIR  Take 1 tablet (10 mg total) by mouth at bedtime.   Mounjaro  5 MG/0.5ML Pen Generic drug: tirzepatide  Inject 5 mg into the skin once a week.   mycophenolate  180 MG EC tablet Commonly known as: MYFORTIC  Take 2 tablets (360 mg total) by mouth every morning AND 1 tablet (180 mg total) every evening.   omeprazole  20 MG capsule Commonly known as: PRILOSEC Take 1 capsule (20 mg total) by mouth daily.   potassium chloride  SA 20 MEQ tablet Commonly known as: KLOR-CON  M Take 1 tablet (20 mEq total) by mouth daily as needed.   predniSONE  5 MG tablet Commonly known as: DELTASONE  Take 1 tablet (5 mg total) by mouth daily.   rosuvastatin  10 MG tablet Commonly known as: CRESTOR  Take 1 tablet (10 mg total) by mouth daily.   Semglee  (yfgn) 100 UNIT/ML Pen Generic drug: insulin  glargine-yfgn Inject 20 Units into the skin daily.   silver  sulfADIAZINE  1 % cream Commonly known as: SILVADENE  Apply 1 Application topically daily.   sodium chloride  0.65 % Soln nasal spray Commonly known as: OCEAN Place 2 sprays into both nostrils every 8 (eight) hours for 10 days.   sulfamethoxazole -trimethoprim  400-80 MG tablet Commonly known as: BACTRIM  Take 1 tablet by mouth 3 (three) times a week.   tacrolimus  1 MG capsule Commonly known as: PROGRAF  Take 2 capsules (2 mg total) by mouth every morning AND 1 capsule (1 mg total) every evening.   tadalafil  5 MG tablet Commonly known as: CIALIS  Take 1 tablet (5 mg total) by mouth daily as needed for erectile dysfunction.   tamsulosin  0.4 MG Caps capsule Commonly known as: FLOMAX  Take 1 capsule (0.4 mg total) by mouth daily.   Testosterone  20.25 MG/ACT (1.62%) Gel Place 2 Pump onto the skin daily.   tiZANidine  2 MG tablet Commonly known as: ZANAFLEX  Take 1 tablet (2 mg total) by mouth every 6 (six) hours as needed for muscle spasms.   torsemide  20 MG  tablet Commonly known as: DEMADEX  Take 4 tablets (80 mg total) by mouth 2 (two) times daily.   Unifine Pentips 31G X 5 MM Misc Generic drug: Insulin  Pen Needle Use 4 times daily as directed What changed: Another medication with the same name was removed. Continue taking this medication, and follow  the directions you see here.   Insupen Pen Needles 31G X 5 MM Misc Generic drug: Insulin  Pen Needle 4 (four) times daily. What changed: Another medication with the same name was removed. Continue taking this medication, and follow the directions you see here.   Zinc  50 MG Tabs Take 1 tablet (50 mg total) by mouth daily.               Discharge Care Instructions  (From admission, onward)           Start     Ordered   09/29/23 0000  Discharge wound care:       Comments: Apply Silvadene  to left leg Q day, then cover with gauze and tape or foam dressing.  Remove all previous Silvadene  with moist gauze before applying more each time.   Cleanse buttocks with soap and water , dry and apply Xeroform gauze Soila [phone]) to area of peeling skin/hematoma daily.  Secure with silicone foam or ABD pad whichever is preferred.   09/29/23 1731            Follow-up Information     Blair Mt, MD. Call .   Specialty: Otolaryngology Why: for a follow up appointment Contact information: [address] [place] [address] [place] [address] [phone]         [name], MD Follow up.   Specialties: Internal Medicine, Pediatrics Why: Hospital follow up Contact information: [address] [place] [address] [phone]                Discharge Exam: Fredricka Weights   09/25/23 0536  Weight: 111 kg   Physical Exam  Constitutional: In no distress.  Cardiovascular: Normal rate, regular rhythm. 1-2+ bilateral lower extremity edema  Pulmonary: Non labored breathing on room air, no wheezing or rales.   Abdominal: Soft. Normal bowel sounds. Non  distended and non tender Musculoskeletal: L shoulder in sling.     Neurological: Alert and oriented to person, place, and time. Non focal  Skin: Buttock wound     Condition at discharge: stable  The results of significant diagnostics from this hospitalization (including imaging, microbiology, ancillary and laboratory) are listed below for reference.   Imaging Studies: DG Elbow Complete Left Result Date: 10/01/2023 CLINICAL DATA:  Elbow pain after fall EXAM: LEFT ELBOW - COMPLETE 3+ VIEW COMPARISON:  None Available. FINDINGS: Suboptimal positioning related to patient tolerance. Vascular calcifications with some clips and a possible graft or av fistula in the antecubital region, correlate with patient history. Exam indeterminate for elbow effusion due to lack of a true lateral projection. Chronic appearing well corticated erosion along the lateral capitellar margin. Irregularity medially along the distal metaphysis on the oblique lateral projection attempt probably due to bony superimposition of the epicondyle. I am generally skeptical of an acute fracture. Substantially reduced sensitivity due to positioning difficulties, consider CT scan. IMPRESSION: 1. Suboptimal positioning related to patient tolerance. I am generally skeptical of an acute fracture, but consider CT scan for further characterization if there is a high clinical index of suspicion. 2. Indeterminate for elbow effusion. 3. Chronic appearing well corticated erosion along the lateral capitellar margin. 4. Vascular calcifications with some clips and a possible graft or AV fistula in the antecubital region, correlate with patient history. Electronically Signed   By: Ryan Salvage M.D.   On: 10/01/2023 09:08   EEG adult Result Date: 09/29/2023 Shelton Arlin KIDD, MD     09/29/2023  5:05 PM Patient  Name: [name]. MRN: [id number] Epilepsy Attending: Arlin MALVA Krebs Referring Physician/Provider: Lanetta Lingo, MD Date: 09/29/2023  Duration: 30.18 mins Patient history: 63yo M with episode of unresponsiveness. EEG to evaluate for seizure Level of alertness: Awake, asleep AEDs during EEG study: GBP Technical aspects: This EEG study was done with scalp electrodes positioned according to the 10-20 International system of electrode placement. Electrical activity was reviewed with band pass filter of 1-70Hz , sensitivity of 7 uV/mm, display speed of 47mm/sec with a 60Hz  notched filter applied as appropriate. EEG data were recorded continuously and digitally stored.  Video monitoring was available and reviewed as appropriate. Description: The posterior dominant rhythm consists of 8 Hz activity of moderate voltage (25-35 uV) seen predominantly in posterior head regions, symmetric and reactive to eye opening and eye closing. Sleep was characterized by vertex waves, sleep spindles (12 to 14 Hz), maximal frontocentral region. Hyperventilation and photic stimulation were not performed.   IMPRESSION: This study is within normal limits. No seizures or epileptiform discharges were seen throughout the recording. A normal interictal EEG does not exclude the diagnosis of epilepsy. Arlin MALVA Krebs   ECHOCARDIOGRAM COMPLETE Result Date: 09/27/2023    ECHOCARDIOGRAM REPORT   Patient Name:   [name]. Date of Exam: 09/26/2023 Medical Rec #:  [id number]               Height:       70.0 in Accession #:    [id number]              Weight:       244.7 lb Date of Birth:  14-Mar-1961               BSA:          2.274 m Patient Age:    63 years                BP:           130/70 mmHg Patient Gender: M                       HR:           62 bpm. Exam Location:  [facility] Procedure: 2D Echo, Cardiac Doppler and Color Doppler (Both Spectral and Color            Flow Doppler were utilized during procedure). Indications:     CHF I50.21  History:         Patient has prior history of Echocardiogram examinations, most                  recent 03/25/2023.  Sonographer:      Thedora Louder RDCS, FASE Referring Phys:  [id number] ATLEE ABERNETHY Diagnosing Phys: Cara JONETTA Lovelace MD  Sonographer Comments: Technically difficult study due to poor echo windows, no apical window, suboptimal parasternal window and patient is obese. Image acquisition challenging due to respiratory motion. Unable to obtain any apical images due to patient having a left arm sling. IMPRESSIONS  1. Left ventricular ejection fraction, by estimation, is 40 to 45%. The left ventricle has mildly decreased function. The left ventricle demonstrates global hypokinesis. The left ventricular internal cavity size was mildly dilated. Left ventricular diastolic function could not be evaluated.  2. Right ventricular systolic function was not well visualized. The right ventricular size is not well visualized.  3. Left atrial size was moderately dilated.  4. Right atrial size was mild to moderately  dilated.  5. The mitral valve was not well visualized. No evidence of mitral valve regurgitation.  6. The aortic valve was not well visualized. Aortic valve regurgitation is not visualized. FINDINGS  Left Ventricle: Left ventricular ejection fraction, by estimation, is 40 to 45%. The left ventricle has mildly decreased function. The left ventricle demonstrates global hypokinesis. Strain was performed and the global longitudinal strain is indeterminate. The left ventricular internal cavity size was mildly dilated. There is borderline left ventricular hypertrophy. Left ventricular diastolic function could not be evaluated. Right Ventricle: The right ventricular size is not well visualized. Right vetricular wall thickness was not well visualized. Right ventricular systolic function was not well visualized. Left Atrium: Left atrial size was moderately dilated. Right Atrium: Right atrial size was mild to moderately dilated. Pericardium: The pericardium was not well visualized. Mitral Valve: The mitral valve was not well visualized. No evidence  of mitral valve regurgitation. Tricuspid Valve: The tricuspid valve is not well visualized. Tricuspid valve regurgitation is not demonstrated. Aortic Valve: The aortic valve was not well visualized. Aortic valve regurgitation is not visualized. Pulmonic Valve: The pulmonic valve was not well visualized. Pulmonic valve regurgitation is not visualized. Aorta: The ascending aorta was not well visualized. IAS/Shunts: No atrial level shunt detected by color flow Doppler. Additional Comments: 3D was performed not requiring image post processing on an independent workstation and was indeterminate.  LEFT VENTRICLE PLAX 2D LVIDd:         5.40 cm LVIDs:         4.30 cm LV PW:         1.10 cm LV IVS:        1.00 cm LVOT diam:     1.80 cm LVOT Area:     2.54 cm  LEFT ATRIUM         Index LA diam:    4.80 cm 2.11 cm/m                        PULMONIC VALVE AORTA                 PV Vmax:        1.86 m/s Ao Root diam: 3.50 cm PV Peak grad:   13.8 mmHg                       RVOT Peak grad: 4 mmHg  TRICUSPID VALVE TR Peak grad:   39.4 mmHg TR Vmax:        314.00 cm/s  SHUNTS Systemic Diam: 1.80 cm Cara JONETTA Lovelace MD Electronically signed by Cara JONETTA Lovelace MD Signature Date/Time: 09/27/2023/12:51:40 PM    Final    DG Chest Port 1 View Result Date: 09/26/2023 CLINICAL DATA:  Shortness of breath EXAM: PORTABLE CHEST 1 VIEW COMPARISON:  04/08/2023 FINDINGS: Sternotomy. Cardiomegaly with vascular congestion and mild diffuse interstitial opacities likely edema. No pleural effusion or focal airspace disease. IMPRESSION: Cardiomegaly with vascular congestion and mild diffuse interstitial opacities likely due to edema. Electronically Signed   By: Luke Bun M.D.   On: 09/26/2023 15:25   MR BRAIN WO CONTRAST Result Date: 09/26/2023 EXAM DESCRIPTION: MR BRAIN WO CONTRAST CLINICAL HISTORY: Mental status change, unknown cause COMPARISON: None available TECHNIQUE: Non contrast MRI of the brain is performed according to our usual  protocol including multiplanar multi sequence technique. FINDINGS: No acute intracranial hemorrhage, mass, edema, or hydrocephalus. Mild cortical atrophy. No encephalomalacia. Mild to moderate white matter  disease suggesting chronic small vessel ischemic change. The vascular flow voids are unremarkable. Mild sinus disease with mild air-fluid levels posteriorly which can be seen with acute maxillary sinusitis. IMPRESSION: Age-related change without acute intracranial pathology. Mild nonspecific sinus disease with mild air-fluid levels which can be seen in acute sinusitis. Electronically signed by: Reyes Frees MD 09/26/2023 12:38 PM EDT RP Workstation: [id number]   CT HEAD CODE STROKE WO CONTRAST` Result Date: 09/26/2023 CLINICAL DATA:  Code stroke. 63 year old male. Recent fall with epistaxis, on Eliquis . EXAM: CT HEAD WITHOUT CONTRAST TECHNIQUE: Contiguous axial images were obtained from the base of the skull through the vertex without intravenous contrast. RADIATION DOSE REDUCTION: This exam was performed according to the departmental dose-optimization program which includes automated exposure control, adjustment of the mA and/or kV according to patient size and/or use of iterative reconstruction technique. COMPARISON:  Head face and cervical spine CT yesterday. FINDINGS: Brain: Cerebral volume is normal for age. No midline shift, ventriculomegaly, mass effect, evidence of mass lesion, intracranial hemorrhage or evidence of cortically based acute infarction. Stable scattered mild to moderate patchy white matter hypodensity. Vascular: Calcified atherosclerosis at the skull base. No suspicious intracranial vascular hyperdensity. Skull: Stable.  No acute osseous abnormality identified. Sinuses/Orbits: Stable maxillary sinus fluid levels, bubbly opacity in the visible nasal cavity and nasopharynx. Tympanic cavities, mastoids remain well aerated. Chronic left mastoidectomy. Other: Extensive scalp vessel calcified  atherosclerosis. Stable orbit and scalp soft tissues. ASPECTS [facility] Stroke Program Early CT Score) Total score (0-10 with 10 being normal): 10 IMPRESSION: 1. Stable non contrast CT appearance of the brain from yesterday. ASPECTS 10. Patchy white matter disease. 2. The above were communicated to Dr. Matthews at 8:58 am on 09/26/2023 by text page via the [facility] messaging system. 3. Stable other face, paranasal sinus, scalp soft tissue findings. Electronically Signed   By: VEAR Hurst M.D.   On: 09/26/2023 08:58   CT Maxillofacial Wo Contrast Result Date: 09/25/2023 CLINICAL DATA:  63 year old male status post fall striking face with subsequent epistaxis. On Eliquis . EXAM: CT MAXILLOFACIAL WITHOUT CONTRAST TECHNIQUE: Multidetector CT imaging of the maxillofacial structures was performed. Multiplanar CT image reconstructions were also generated. RADIATION DOSE REDUCTION: This exam was performed according to the departmental dose-optimization program which includes automated exposure control, adjustment of the mA and/or kV according to patient size and/or use of iterative reconstruction technique. COMPARISON:  Head CT today. FINDINGS: Mild motion artifact. Osseous: Mandible motion artifact. Normally aligned bilateral TMJ. No mandible fracture is evident. Bilateral maxilla, nasal bones, pterygoid bones, zygoma appears symmetric and intact. Visible skull base, cervical vertebrae appear intact and aligned. Orbits: No orbital wall fracture. Postoperative changes to both globes. Otherwise negative orbits soft tissues. Sinuses: Mild leftward nasal septal deviation is stable from 2024 brain MRI. Mild retained secretions in the anterior inferior nasal cavity. Opacified bilateral posterior nasal meatus. No nasal septal swelling identified. Small volume layering hemorrhage in the maxillary sinuses. Trace fluid in the right sphenoid sinus. Left greater than right ethmoid opacification is more chronic. Frontal sinuses are clear. Tympanic  cavities and mastoids are clear. Previous left mastoidectomy. Soft tissues: Larynx and pharynx motion artifact. Oropharynx appears pneumatized. There is opacification of the nasopharynx contiguous with the posterior nasal cavity. Negative visible noncontrast parapharyngeal spaces, retropharyngeal space, sublingual space, submandibular spaces, masticator and parotid spaces (atrophy parotid glands). Widespread calcified atherosclerosis. Limited intracranial: Stable to that reported separately today. IMPRESSION: 1. Mild motion artifact. No acute facial fracture identified. 2. Opacified posterior nasal cavity, nasopharynx. Small volume  hemorrhage layering in the maxillary and right sphenoid sinuses. No acute nasal fracture identified. Electronically Signed   By: VEAR Hurst M.D.   On: 09/25/2023 07:22   CT Head Wo Contrast Result Date: 09/25/2023 CLINICAL DATA:  63 year old male status post fall striking face with subsequent epistaxis. On Eliquis . EXAM: CT HEAD WITHOUT CONTRAST TECHNIQUE: Contiguous axial images were obtained from the base of the skull through the vertex without intravenous contrast. RADIATION DOSE REDUCTION: This exam was performed according to the departmental dose-optimization program which includes automated exposure control, adjustment of the mA and/or kV according to patient size and/or use of iterative reconstruction technique. COMPARISON:  Face CT today reported separately.  Head CT 06/20/2023. FINDINGS: Brain: Stable cerebral volume, within normal limits for age. No midline shift, ventriculomegaly, mass effect, evidence of mass lesion, intracranial hemorrhage or evidence of cortically based acute infarction. Mild for age scattered patchy white matter hypodensity is stable. Vascular: Calcified atherosclerosis at the skull base. No suspicious intracranial vascular hyperdensity. Skull: No acute fracture identified.  Face is reported separately. Sinuses/Orbits: Layering hyperdense fluid compatible  with blood in the maxillary sinuses. Fluid in the visible nasopharynx. Scattered ethmoid mucosal thickening and opacification unchanged from March. Tympanic cavities and mastoids remain well aerated, chronic left mastoidectomy. Other: Calcified scalp vessel atherosclerosis. No acute orbit or scalp soft tissue finding identified. IMPRESSION: 1. Small volume hemorrhage layering in the maxillary sinuses, and fluid in the visible pharynx. See Face CT reported separately. 2. No acute intracranial abnormality. Stable non contrast CT appearance of chronic white matter disease. Electronically Signed   By: VEAR Hurst M.D.   On: 09/25/2023 07:19    Microbiology: Results for orders placed or performed during the hospital encounter of 04/08/23  Resp panel by RT-PCR (RSV, Flu A&B, Covid) Anterior Nasal Swab     Status: Abnormal   Collection Time: 04/08/23  7:20 PM   Specimen: Anterior Nasal Swab  Result Value Ref Range Status   SARS Coronavirus 2 by RT PCR NEGATIVE NEGATIVE Final    Comment: (NOTE) SARS-CoV-2 target nucleic acids are NOT DETECTED.  The SARS-CoV-2 RNA is generally detectable in upper respiratory specimens during the acute phase of infection. The lowest concentration of SARS-CoV-2 viral copies this assay can detect is 138 copies/mL. A negative result does not preclude SARS-Cov-2 infection and should not be used as the sole basis for treatment or other patient management decisions. A negative result may occur with  improper specimen collection/handling, submission of specimen other than nasopharyngeal swab, presence of viral mutation(s) within the areas targeted by this assay, and inadequate number of viral copies(<138 copies/mL). A negative result must be combined with clinical observations, patient history, and epidemiological information. The expected result is Negative.  Fact Sheet for Patients:  BloggerCourse.com  Fact Sheet for Healthcare Providers:   SeriousBroker.it  This test is no t yet approved or cleared by the [place]  FDA and  has been authorized for detection and/or diagnosis of SARS-CoV-2 by FDA under an Emergency Use Authorization (EUA). This EUA will remain  in effect (meaning this test can be used) for the duration of the COVID-19 declaration under Section 564(b)(1) of the Act, 21 U.S.C.section 360bbb-3(b)(1), unless the authorization is terminated  or revoked sooner.       Influenza A by PCR POSITIVE (A) NEGATIVE Final   Influenza B by PCR NEGATIVE NEGATIVE Final    Comment: (NOTE) The Xpert Xpress SARS-CoV-2/FLU/RSV plus assay is intended as an aid in the diagnosis of influenza from  Nasopharyngeal swab specimens and should not be used as a sole basis for treatment. Nasal washings and aspirates are unacceptable for Xpert Xpress SARS-CoV-2/FLU/RSV testing.  Fact Sheet for Patients: BloggerCourse.com  Fact Sheet for Healthcare Providers: SeriousBroker.it  This test is not yet approved or cleared by the [place]  FDA and has been authorized for detection and/or diagnosis of SARS-CoV-2 by FDA under an Emergency Use Authorization (EUA). This EUA will remain in effect (meaning this test can be used) for the duration of the COVID-19 declaration under Section 564(b)(1) of the Act, 21 U.S.C. section 360bbb-3(b)(1), unless the authorization is terminated or revoked.     Resp Syncytial Virus by PCR NEGATIVE NEGATIVE Final    Comment: (NOTE) Fact Sheet for Patients: BloggerCourse.com  Fact Sheet for Healthcare Providers: SeriousBroker.it  This test is not yet approved or cleared by the [place]  FDA and has been authorized for detection and/or diagnosis of SARS-CoV-2 by FDA under an Emergency Use Authorization (EUA). This EUA will remain in effect (meaning this test can be used)  for the duration of the COVID-19 declaration under Section 564(b)(1) of the Act, 21 U.S.C. section 360bbb-3(b)(1), unless the authorization is terminated or revoked.  Performed at [facility], [address] [place], [place] [address]   Blood culture (routine x 2)     Status: None   Collection Time: 04/08/23 10:31 PM   Specimen: BLOOD  Result Value Ref Range Status   Specimen Description   Final    BLOOD RIGHT FOREARM Performed at [facility], [name], [place], [place] [address]    Special Requests   Final    BOTTLES DRAWN AEROBIC AND ANAEROBIC Blood Culture results may not be optimal due to an inadequate volume of blood received in culture bottles Performed at [facility], [address] [place], [place] [address]    Culture   Final    NO GROWTH 5 DAYS Performed at [facility], [address] [place], [place] [address]    Report Status 04/14/2023 FINAL  Final  Blood culture (routine x 2)     Status: None   Collection Time: 04/08/23 10:47 PM   Specimen: BLOOD  Result Value Ref Range Status   Specimen Description   Final    BLOOD RIGHT ASSIST CONTROL Performed at [facility], [address] [place], [place] [address]    Special Requests   Final    BOTTLES DRAWN AEROBIC AND ANAEROBIC Blood Culture adequate volume Performed at [facility], [address] [place], [place] [address]    Culture   Final    NO GROWTH 5 DAYS Performed at [facility], [address], [place], [place] [address]    Report Status 04/14/2023 FINAL  Final  Expectorated Sputum Assessment w Gram Stain, Rflx to Resp Cult     Status: None   Collection Time: 04/09/23 11:37 AM   Specimen: Sputum  Result Value Ref Range Status   Specimen Description SPUTUM  Final   Special Requests NONE  Final   Sputum evaluation   Final    THIS SPECIMEN IS ACCEPTABLE FOR SPUTUM CULTURE Performed at [facility], [address] [place], [place] [address]    Report Status 04/09/2023 FINAL  Final  Culture, Respiratory w Gram Stain     Status: None   Collection Time: 04/09/23 11:37 AM   Specimen: SPU  Result Value Ref Range Status   Specimen Description   Final  SPUTUM Performed at [facility], [address] [place], [place] [address]    Special Requests   Final    NONE Reflexed from [id number] Performed at [facility], [address] [place], [place] [address]    Gram Stain   Final    ABUNDANT WBC PRESENT, PREDOMINANTLY PMN RARE GRAM POSITIVE RODS RARE GRAM POSITIVE COCCI    Culture   Final    FEW Normal respiratory flora-no Staph aureus or Pseudomonas seen Performed at [facility], [address] [place], [place] [address]    Report Status 04/12/2023 FINAL  Final   *Note: Due to a large number of results and/or encounters for the requested time period, some results have not been displayed. A complete set of results can be found in Results Review.    Labs: CBC: Recent Labs  Lab 09/25/23 1955 09/26/23 0446 09/27/23 0437 09/28/23 0501 09/29/23 0507  WBC 3.0* 3.0* 3.4* 3.1* 3.3*  HGB 8.2* 7.5* 7.9* 8.2* 7.8*  HCT 26.4* 24.8* 26.5* 27.0* 26.3*  MCV 94.3 94.7 96.7 96.4 96.0  PLT 116* 118* 117* 102* 109*   Basic Metabolic Panel: Recent Labs  Lab 09/26/23 0446 09/27/23 0437 09/28/23 0501 09/29/23 0507  NA 142 143 143 139  K 3.6 3.5 3.7 3.8  CL 100 101 102 100  CO2 30 30 31 31   GLUCOSE 116* 138* 158* 181*  BUN 117* 112* 110* 107*  CREATININE 3.78* 3.86* 3.63* 3.19*  CALCIUM  8.8* 8.7* 8.7* 8.8*  MG 2.4 2.7* 2.5* 2.4   Liver Function Tests: Recent Labs  Lab 09/26/23 0446 09/27/23 0437 09/28/23 0501 09/29/23 0507  AST 19 18 21 21   ALT 17 18 19 19   ALKPHOS 162* 170* 165* 150*  BILITOT 1.2 0.9 0.8 1.0  PROT 6.3* 6.4* 6.0* 6.4*  ALBUMIN  3.1* 3.0* 2.9* 3.0*   CBG: Recent Labs  Lab 09/26/23 0829  GLUCAP 119*    Discharge time spent: greater than  30 minutes.  Signed: [name] Pepper, MD [facility] 10/01/2023

## 2023-10-01 NOTE — Assessment & Plan Note (Signed)
 Improving but still needs the hydrocodone  Will refill

## 2023-10-01 NOTE — Assessment & Plan Note (Signed)
 Discussed that it will take a long time to heal Wife will cover open area with neosporin daily

## 2023-10-01 NOTE — Assessment & Plan Note (Signed)
 Due to direct trauma after fall Resolved now

## 2023-10-02 ENCOUNTER — Encounter (HOSPITAL_COMMUNITY): Payer: Self-pay

## 2023-10-02 ENCOUNTER — Ambulatory Visit (HOSPITAL_COMMUNITY)
Admission: RE | Admit: 2023-10-02 | Discharge: 2023-10-02 | Disposition: A | Discharge status: Home / Self Care | Source: Ambulatory Visit | Attending: Cardiology | Admitting: Cardiology

## 2023-10-02 ENCOUNTER — Ambulatory Visit (HOSPITAL_COMMUNITY): Payer: Self-pay | Admitting: Cardiology

## 2023-10-02 ENCOUNTER — Telehealth: Payer: Self-pay

## 2023-10-02 VITALS — BP 120/70 | HR 75 | Wt 250.2 lb

## 2023-10-02 DIAGNOSIS — Z940 Kidney transplant status: Secondary | ICD-10-CM | POA: Insufficient documentation

## 2023-10-02 DIAGNOSIS — M6289 Other specified disorders of muscle: Secondary | ICD-10-CM | POA: Diagnosis not present

## 2023-10-02 DIAGNOSIS — D472 Monoclonal gammopathy: Secondary | ICD-10-CM | POA: Diagnosis not present

## 2023-10-02 DIAGNOSIS — Z951 Presence of aortocoronary bypass graft: Secondary | ICD-10-CM | POA: Diagnosis not present

## 2023-10-02 DIAGNOSIS — Z87891 Personal history of nicotine dependence: Secondary | ICD-10-CM | POA: Insufficient documentation

## 2023-10-02 DIAGNOSIS — Z7984 Long term (current) use of oral hypoglycemic drugs: Secondary | ICD-10-CM | POA: Diagnosis not present

## 2023-10-02 DIAGNOSIS — Z794 Long term (current) use of insulin: Secondary | ICD-10-CM | POA: Insufficient documentation

## 2023-10-02 DIAGNOSIS — N184 Chronic kidney disease, stage 4 (severe): Secondary | ICD-10-CM | POA: Diagnosis not present

## 2023-10-02 DIAGNOSIS — I272 Pulmonary hypertension, unspecified: Secondary | ICD-10-CM | POA: Insufficient documentation

## 2023-10-02 DIAGNOSIS — D631 Anemia in chronic kidney disease: Secondary | ICD-10-CM | POA: Diagnosis not present

## 2023-10-02 DIAGNOSIS — Z79899 Other long term (current) drug therapy: Secondary | ICD-10-CM | POA: Insufficient documentation

## 2023-10-02 DIAGNOSIS — Z9181 History of falling: Secondary | ICD-10-CM | POA: Diagnosis not present

## 2023-10-02 DIAGNOSIS — G4733 Obstructive sleep apnea (adult) (pediatric): Secondary | ICD-10-CM | POA: Diagnosis not present

## 2023-10-02 DIAGNOSIS — I255 Ischemic cardiomyopathy: Secondary | ICD-10-CM | POA: Diagnosis not present

## 2023-10-02 DIAGNOSIS — I5032 Chronic diastolic (congestive) heart failure: Secondary | ICD-10-CM | POA: Diagnosis not present

## 2023-10-02 DIAGNOSIS — E1122 Type 2 diabetes mellitus with diabetic chronic kidney disease: Secondary | ICD-10-CM | POA: Insufficient documentation

## 2023-10-02 DIAGNOSIS — I2510 Atherosclerotic heart disease of native coronary artery without angina pectoris: Secondary | ICD-10-CM | POA: Diagnosis not present

## 2023-10-02 DIAGNOSIS — I482 Chronic atrial fibrillation, unspecified: Secondary | ICD-10-CM | POA: Insufficient documentation

## 2023-10-02 DIAGNOSIS — R001 Bradycardia, unspecified: Secondary | ICD-10-CM | POA: Insufficient documentation

## 2023-10-02 DIAGNOSIS — I130 Hypertensive heart and chronic kidney disease with heart failure and stage 1 through stage 4 chronic kidney disease, or unspecified chronic kidney disease: Secondary | ICD-10-CM | POA: Insufficient documentation

## 2023-10-02 DIAGNOSIS — Z7901 Long term (current) use of anticoagulants: Secondary | ICD-10-CM | POA: Insufficient documentation

## 2023-10-02 LAB — BASIC METABOLIC PANEL WITH GFR
Anion gap: 11 (ref 5–15)
BUN: 105 mg/dL — ABNORMAL HIGH (ref 8–23)
CO2: 28 mmol/L (ref 22–32)
Calcium: 8.7 mg/dL — ABNORMAL LOW (ref 8.9–10.3)
Chloride: 100 mmol/L (ref 98–111)
Creatinine, Ser: 3.57 mg/dL — ABNORMAL HIGH (ref 0.61–1.24)
GFR, Estimated: 18 mL/min — ABNORMAL LOW (ref >60)
Glucose, Bld: 107 mg/dL — ABNORMAL HIGH (ref 70–99)
Potassium: 3.4 mmol/L — ABNORMAL LOW (ref 3.5–5.1)
Sodium: 139 mmol/L (ref 135–145)

## 2023-10-02 NOTE — Telephone Encounter (Signed)
 Copied from CRM [id number]. Topic: Clinical - Home Health Verbal Orders >> Oct 02, 2023  3:54 PM Robinson DEL wrote: Caller/Agency: Tiffany/[facility] Callback Number: [phone] Secure Line Service Requested: Physical Therapy, Occupational, Home Health, skilled nursing Frequency: PT 1 week 1, 2 week 1, 1 week 6 Any new concerns about the patient? Yes, fall 2 weeks before hospital stay

## 2023-10-02 NOTE — Patient Instructions (Signed)
 RESTART your Eliquis .  INCREASE Torsemide  to 100 mg Twice daily for 2 days, then change to 100 mg in the morning and 80 mg in the evening.  TAKE Metolazone  2.5 mg today ONLY.  Labs done today, your results will be available in MyChart, we will contact you for abnormal readings.  Repeat blood work in 1 week.  Your physician recommends that you schedule a follow-up appointment as scheduled.   If you have any questions or concerns before your next appointment please send us  a message through [place] or call our office at [phone].    TO LEAVE A MESSAGE FOR THE NURSE SELECT OPTION 2, PLEASE LEAVE A MESSAGE INCLUDING: YOUR NAME DATE OF BIRTH CALL BACK NUMBER REASON FOR CALL**this is important as we prioritize the call backs  YOU WILL RECEIVE A CALL BACK THE SAME DAY AS LONG AS YOU CALL BEFORE 4:00 PM  At the [facility], you and your health needs are our priority. As part of our continuing mission to provide you with exceptional heart care, we have created designated Provider Care Teams. These Care Teams include your primary Cardiologist (physician) and Advanced Practice Providers (APPs- Physician Assistants and Nurse Practitioners) who all work together to provide you with the care you need, when you need it.   You may see any of the following providers on your designated Care Team at your next follow up: Dr Toribio Fuel Dr Ezra Shuck Dr. Ria Commander Dr. Morene Brownie Amy Lenetta, NP Caffie Shed, [place] [facility] [place], [place] Beckey Coe, NP [place] Lee, NP Ellouise Class, NP Tinnie Redman, PharmD Jaun Bash, PharmD   Please be sure to bring in all your medications bottles to every appointment.    Thank you for choosing [facility]

## 2023-10-02 NOTE — Progress Notes (Signed)
 ReDS Vest / Clip - 10/02/23 0900       ReDS Vest / Clip   Station Marker D    Ruler Value 35    ReDS Value Range Moderate volume overload    ReDS Actual Value 40

## 2023-10-03 NOTE — Telephone Encounter (Signed)
Verbal orders given to Tiffany.  

## 2023-10-06 ENCOUNTER — Emergency Department

## 2023-10-06 ENCOUNTER — Other Ambulatory Visit: Payer: Self-pay

## 2023-10-06 ENCOUNTER — Emergency Department
Admission: EM | Admit: 2023-10-06 | Discharge: 2023-10-06 | Disposition: A | Discharge status: Home / Self Care | Attending: Emergency Medicine | Admitting: Emergency Medicine

## 2023-10-06 DIAGNOSIS — Z7901 Long term (current) use of anticoagulants: Secondary | ICD-10-CM | POA: Insufficient documentation

## 2023-10-06 DIAGNOSIS — I2510 Atherosclerotic heart disease of native coronary artery without angina pectoris: Secondary | ICD-10-CM | POA: Diagnosis not present

## 2023-10-06 DIAGNOSIS — N186 End stage renal disease: Secondary | ICD-10-CM | POA: Insufficient documentation

## 2023-10-06 DIAGNOSIS — I120 Hypertensive chronic kidney disease with stage 5 chronic kidney disease or end stage renal disease: Secondary | ICD-10-CM | POA: Insufficient documentation

## 2023-10-06 DIAGNOSIS — M7989 Other specified soft tissue disorders: Secondary | ICD-10-CM | POA: Diagnosis not present

## 2023-10-06 DIAGNOSIS — R600 Localized edema: Secondary | ICD-10-CM | POA: Insufficient documentation

## 2023-10-06 DIAGNOSIS — R2232 Localized swelling, mass and lump, left upper limb: Secondary | ICD-10-CM | POA: Diagnosis present

## 2023-10-06 DIAGNOSIS — S4292XA Fracture of left shoulder girdle, part unspecified, initial encounter for closed fracture: Secondary | ICD-10-CM | POA: Diagnosis not present

## 2023-10-06 NOTE — ED Provider Notes (Signed)
 [facility] Provider Note    Event Date/Time   First MD Initiated Contact with Patient 10/06/23 0730     (approximate)   History   Left arm swelling  HPI  [name]. is a 63 y.o. male with a history of CAD, hypertension, atrial fibrillation on Eliquis , end-stage renal disease who presents with left arm swelling.  Patient reports around 4 weeks ago he had a fall and broke his shoulder .  He has been wearing a sling as directed by his doctor.  He reports he has had swelling in his left arm and is concerned about a clot possible clot.  He was off of his Eliquis  for about a week because of the hematoma.  He does have a fistula in his left arm that was never used.     Physical Exam   Triage Vital Signs: ED Triage Vitals  Encounter Vitals Group     BP 10/06/23 0723 120/73     Girls Systolic BP Percentile --      Girls Diastolic BP Percentile --      Boys Systolic BP Percentile --      Boys Diastolic BP Percentile --      Pulse Rate 10/06/23 0723 (!) 127     Resp 10/06/23 0723 18     Temp 10/06/23 0723 98 F (36.7 C)     Temp Source 10/06/23 0723 Oral     SpO2 10/06/23 0723 95 %     Weight 10/06/23 0726 113.4 kg (250 lb)     Height 10/06/23 0726 1.778 m (5' 10)     Head Circumference --      Peak Flow --      Pain Score 10/06/23 0709 5     Pain Loc --      Pain Education --      Exclude from Growth Chart --     Most recent vital signs: Vitals:   10/06/23 0723 10/06/23 1029  BP: 120/73 137/72  Pulse: (!) 127 63  Resp: 18 16  Temp: 98 F (36.7 C)   SpO2: 95% 95%     General: Awake, no distress.  CV:  Good peripheral perfusion.  Resp:  Normal effort.  Abd:  No distention.  Other:  Left arm with mild diffuse swelling, normal cap refill, normal pulses distally, no bruising or redness to suggest infection   ED Results / Procedures / Treatments   Labs (all labs ordered are listed, but only abnormal results are  displayed) Labs Reviewed - No data to display   EKG     RADIOLOGY Elbow x-ray viewed to provide me, no fracture noted   PROCEDURES:  Critical Care performed:   Procedures   MEDICATIONS ORDERED IN ED: Medications - No data to display   IMPRESSION / MDM / ASSESSMENT AND PLAN / ED COURSE  I reviewed the triage vital signs and the nursing notes. Patient's presentation is most consistent with acute presentation with potential threat to life or bodily function.  Patient presents with left arm swelling as above, given recent injury and period of time off Eliquis  certainly possibility of DVT, differential also includes dependent edema from being in sling  Patient was initially mildly tachycardic but improved my exam, probably related to his known atrial fibrillation.  Will image the elbow and obtain ultrasound  Elbow x-ray without acute abnormality.  Ultrasound negative for DVT, patient is relieved by this, appropriate for discharge with outpatient follow-up with PCP,  suspect edema is related to dependent position and sling      FINAL CLINICAL IMPRESSION(S) / ED DIAGNOSES   Final diagnoses:  Edema of left upper arm     Rx / DC Orders   ED Discharge Orders     None        Note:  This document was prepared using Dragon voice recognition software and may include unintentional dictation errors.   Carl Charleston, MD 10/06/23 1152

## 2023-10-06 NOTE — ED Triage Notes (Incomplete)
 Pt to ED for L hand swelling since 2 weeks after he broke his shoulder and was seen at ED in [facility]. Pt has sling. Hand is markedly swollen. Cap refill is <2 sec. Pt also has dialysis fistula to same arm, has not been used for 13 years and does not get dialysis. L elbow is hurting but according to xrays at other hospital, it was not broken.

## 2023-10-07 ENCOUNTER — Other Ambulatory Visit: Payer: Self-pay

## 2023-10-07 ENCOUNTER — Inpatient Hospital Stay: Attending: Oncology

## 2023-10-07 ENCOUNTER — Telehealth: Payer: Self-pay | Admitting: *Deleted

## 2023-10-07 DIAGNOSIS — D696 Thrombocytopenia, unspecified: Secondary | ICD-10-CM | POA: Diagnosis not present

## 2023-10-07 DIAGNOSIS — S4292XD Fracture of left shoulder girdle, part unspecified, subsequent encounter for fracture with routine healing: Secondary | ICD-10-CM | POA: Diagnosis not present

## 2023-10-07 DIAGNOSIS — D61818 Other pancytopenia: Secondary | ICD-10-CM | POA: Diagnosis not present

## 2023-10-07 DIAGNOSIS — I4821 Permanent atrial fibrillation: Secondary | ICD-10-CM | POA: Diagnosis not present

## 2023-10-07 DIAGNOSIS — I272 Pulmonary hypertension, unspecified: Secondary | ICD-10-CM | POA: Diagnosis not present

## 2023-10-07 DIAGNOSIS — I700 Atherosclerosis of aorta: Secondary | ICD-10-CM | POA: Diagnosis not present

## 2023-10-07 DIAGNOSIS — E119 Type 2 diabetes mellitus without complications: Secondary | ICD-10-CM | POA: Diagnosis not present

## 2023-10-07 DIAGNOSIS — D509 Iron deficiency anemia, unspecified: Secondary | ICD-10-CM

## 2023-10-07 DIAGNOSIS — I110 Hypertensive heart disease with heart failure: Secondary | ICD-10-CM | POA: Diagnosis not present

## 2023-10-07 DIAGNOSIS — E662 Morbid (severe) obesity with alveolar hypoventilation: Secondary | ICD-10-CM | POA: Diagnosis not present

## 2023-10-07 DIAGNOSIS — I5042 Chronic combined systolic (congestive) and diastolic (congestive) heart failure: Secondary | ICD-10-CM | POA: Diagnosis not present

## 2023-10-07 NOTE — Telephone Encounter (Signed)
 The patient sent a message stating that he had been in the ER for several hours and he will not be able to come to his appointment he is worn out by being in the ER.  He wants to change his lab appointment from 7/15 to either a Wednesday or Friday. I called him and got his voicemail and so I told him that he is now on Friday at 1030 for his labs.  If he needs any thing else he can just give us  a call

## 2023-10-09 ENCOUNTER — Telehealth: Payer: Self-pay

## 2023-10-09 ENCOUNTER — Other Ambulatory Visit (HOSPITAL_COMMUNITY)

## 2023-10-09 DIAGNOSIS — E119 Type 2 diabetes mellitus without complications: Secondary | ICD-10-CM | POA: Diagnosis not present

## 2023-10-09 DIAGNOSIS — D696 Thrombocytopenia, unspecified: Secondary | ICD-10-CM | POA: Diagnosis not present

## 2023-10-09 DIAGNOSIS — S4292XD Fracture of left shoulder girdle, part unspecified, subsequent encounter for fracture with routine healing: Secondary | ICD-10-CM | POA: Diagnosis not present

## 2023-10-09 DIAGNOSIS — I700 Atherosclerosis of aorta: Secondary | ICD-10-CM | POA: Diagnosis not present

## 2023-10-09 DIAGNOSIS — I5042 Chronic combined systolic (congestive) and diastolic (congestive) heart failure: Secondary | ICD-10-CM | POA: Diagnosis not present

## 2023-10-09 DIAGNOSIS — E662 Morbid (severe) obesity with alveolar hypoventilation: Secondary | ICD-10-CM | POA: Diagnosis not present

## 2023-10-09 DIAGNOSIS — D61818 Other pancytopenia: Secondary | ICD-10-CM | POA: Diagnosis not present

## 2023-10-09 DIAGNOSIS — I110 Hypertensive heart disease with heart failure: Secondary | ICD-10-CM | POA: Diagnosis not present

## 2023-10-09 DIAGNOSIS — I4821 Permanent atrial fibrillation: Secondary | ICD-10-CM | POA: Diagnosis not present

## 2023-10-09 DIAGNOSIS — I272 Pulmonary hypertension, unspecified: Secondary | ICD-10-CM | POA: Diagnosis not present

## 2023-10-09 NOTE — Transitions of Care (Post Inpatient/ED Visit) (Signed)
 Unable to reach pt by phone; no answer and no v/m .    10/09/2023  Name: [name]. MRN: [id number] DOB: 1960-10-20  Today's TOC FU Call Status: Today's TOC FU Call Status:: Unsuccessful Call (1st Attempt) Unsuccessful Call (1st Attempt) Date: 10/09/23  Attempted to reach the patient regarding the most recent Inpatient/ED visit.  Follow Up Plan: Additional outreach attempts will be made to reach the patient to complete the Transitions of Care (Post Inpatient/ED visit) call.   Signature .Laray Arenas, LPN

## 2023-10-10 ENCOUNTER — Inpatient Hospital Stay: Attending: Oncology

## 2023-10-10 ENCOUNTER — Other Ambulatory Visit
Admission: RE | Admit: 2023-10-10 | Discharge: 2023-10-10 | Disposition: A | Discharge status: Home / Self Care | Attending: Cardiology | Admitting: Cardiology

## 2023-10-10 ENCOUNTER — Ambulatory Visit: Payer: Self-pay

## 2023-10-10 DIAGNOSIS — Z800 Family history of malignant neoplasm of digestive organs: Secondary | ICD-10-CM | POA: Insufficient documentation

## 2023-10-10 DIAGNOSIS — Z803 Family history of malignant neoplasm of breast: Secondary | ICD-10-CM | POA: Insufficient documentation

## 2023-10-10 DIAGNOSIS — D509 Iron deficiency anemia, unspecified: Secondary | ICD-10-CM | POA: Diagnosis not present

## 2023-10-10 DIAGNOSIS — I5032 Chronic diastolic (congestive) heart failure: Secondary | ICD-10-CM | POA: Insufficient documentation

## 2023-10-10 DIAGNOSIS — Z87891 Personal history of nicotine dependence: Secondary | ICD-10-CM | POA: Insufficient documentation

## 2023-10-10 DIAGNOSIS — D631 Anemia in chronic kidney disease: Secondary | ICD-10-CM | POA: Diagnosis not present

## 2023-10-10 DIAGNOSIS — Z940 Kidney transplant status: Secondary | ICD-10-CM | POA: Diagnosis not present

## 2023-10-10 DIAGNOSIS — N184 Chronic kidney disease, stage 4 (severe): Secondary | ICD-10-CM | POA: Diagnosis not present

## 2023-10-10 DIAGNOSIS — Z79899 Other long term (current) drug therapy: Secondary | ICD-10-CM | POA: Diagnosis not present

## 2023-10-10 LAB — BASIC METABOLIC PANEL WITH GFR
Anion gap: 9 (ref 5–15)
BUN: 90 mg/dL — ABNORMAL HIGH (ref 8–23)
CO2: 29 mmol/L (ref 22–32)
Calcium: 8.9 mg/dL (ref 8.9–10.3)
Chloride: 102 mmol/L (ref 98–111)
Creatinine, Ser: 2.96 mg/dL — ABNORMAL HIGH (ref 0.61–1.24)
GFR, Estimated: 23 mL/min — ABNORMAL LOW (ref >60)
Glucose, Bld: 153 mg/dL — ABNORMAL HIGH (ref 70–99)
Potassium: 4.2 mmol/L (ref 3.5–5.1)
Sodium: 140 mmol/L (ref 135–145)

## 2023-10-10 LAB — CBC (CANCER CENTER ONLY)
HCT: 27.6 % — ABNORMAL LOW (ref 39.0–52.0)
Hemoglobin: 8.4 g/dL — ABNORMAL LOW (ref 13.0–17.0)
MCH: 29.2 pg (ref 26.0–34.0)
MCHC: 30.4 g/dL (ref 30.0–36.0)
MCV: 95.8 fL (ref 80.0–100.0)
Platelet Count: 131 K/uL — ABNORMAL LOW (ref 150–400)
RBC: 2.88 MIL/uL — ABNORMAL LOW (ref 4.22–5.81)
RDW: 15.4 % (ref 11.5–15.5)
WBC Count: 3.2 K/uL — ABNORMAL LOW (ref 4.0–10.5)
nRBC: 0 % (ref 0.0–0.2)

## 2023-10-10 LAB — IRON AND TIBC
Iron: 28 ug/dL — ABNORMAL LOW (ref 45–182)
Saturation Ratios: 10 % — ABNORMAL LOW (ref 17.9–39.5)
TIBC: 276 ug/dL (ref 250–450)
UIBC: 248 ug/dL

## 2023-10-10 LAB — FERRITIN: Ferritin: 127 ng/mL (ref 24–336)

## 2023-10-10 NOTE — Telephone Encounter (Signed)
-----   Message from Annah JAYSON Skene sent at 10/10/2023 12:17 PM EDT ----- Regarding: FW: Needs iv iron  ----- Message ----- From: Rebecka, Lab In [facility] Sent: 10/10/2023  10:49 AM EDT To: Annah JAYSON Skene, MD

## 2023-10-10 NOTE — Telephone Encounter (Signed)
 Per Dr. Melanee Needs IV iron .  Outbound call to patient.  Reviewed patient is scheduled to see Lauren followed by possible IV venofer  afterwards (doesn't appear IV iron  order is in).  Patient is requesting to come in sooner than scheduled for IV iron .  Informed patient I would forward to provider and see if we can accommodate a sooner. Patient verbalized understanding.

## 2023-10-12 DIAGNOSIS — S81802A Unspecified open wound, left lower leg, initial encounter: Secondary | ICD-10-CM | POA: Diagnosis not present

## 2023-10-13 DIAGNOSIS — I110 Hypertensive heart disease with heart failure: Secondary | ICD-10-CM | POA: Diagnosis not present

## 2023-10-13 DIAGNOSIS — I272 Pulmonary hypertension, unspecified: Secondary | ICD-10-CM | POA: Diagnosis not present

## 2023-10-13 DIAGNOSIS — E119 Type 2 diabetes mellitus without complications: Secondary | ICD-10-CM | POA: Diagnosis not present

## 2023-10-13 DIAGNOSIS — I5042 Chronic combined systolic (congestive) and diastolic (congestive) heart failure: Secondary | ICD-10-CM | POA: Diagnosis not present

## 2023-10-13 DIAGNOSIS — I4821 Permanent atrial fibrillation: Secondary | ICD-10-CM | POA: Diagnosis not present

## 2023-10-13 DIAGNOSIS — S4292XD Fracture of left shoulder girdle, part unspecified, subsequent encounter for fracture with routine healing: Secondary | ICD-10-CM | POA: Diagnosis not present

## 2023-10-13 DIAGNOSIS — D696 Thrombocytopenia, unspecified: Secondary | ICD-10-CM | POA: Diagnosis not present

## 2023-10-13 DIAGNOSIS — E662 Morbid (severe) obesity with alveolar hypoventilation: Secondary | ICD-10-CM | POA: Diagnosis not present

## 2023-10-13 DIAGNOSIS — D61818 Other pancytopenia: Secondary | ICD-10-CM | POA: Diagnosis not present

## 2023-10-13 DIAGNOSIS — I700 Atherosclerosis of aorta: Secondary | ICD-10-CM | POA: Diagnosis not present

## 2023-10-14 ENCOUNTER — Encounter: Payer: Self-pay | Admitting: Oncology

## 2023-10-14 DIAGNOSIS — I110 Hypertensive heart disease with heart failure: Secondary | ICD-10-CM | POA: Diagnosis not present

## 2023-10-14 DIAGNOSIS — D696 Thrombocytopenia, unspecified: Secondary | ICD-10-CM | POA: Diagnosis not present

## 2023-10-14 DIAGNOSIS — S4292XD Fracture of left shoulder girdle, part unspecified, subsequent encounter for fracture with routine healing: Secondary | ICD-10-CM | POA: Diagnosis not present

## 2023-10-14 DIAGNOSIS — I272 Pulmonary hypertension, unspecified: Secondary | ICD-10-CM | POA: Diagnosis not present

## 2023-10-14 DIAGNOSIS — I5042 Chronic combined systolic (congestive) and diastolic (congestive) heart failure: Secondary | ICD-10-CM | POA: Diagnosis not present

## 2023-10-14 DIAGNOSIS — I4821 Permanent atrial fibrillation: Secondary | ICD-10-CM | POA: Diagnosis not present

## 2023-10-14 DIAGNOSIS — E662 Morbid (severe) obesity with alveolar hypoventilation: Secondary | ICD-10-CM | POA: Diagnosis not present

## 2023-10-14 DIAGNOSIS — D61818 Other pancytopenia: Secondary | ICD-10-CM | POA: Diagnosis not present

## 2023-10-14 DIAGNOSIS — I700 Atherosclerosis of aorta: Secondary | ICD-10-CM | POA: Diagnosis not present

## 2023-10-14 DIAGNOSIS — E119 Type 2 diabetes mellitus without complications: Secondary | ICD-10-CM | POA: Diagnosis not present

## 2023-10-14 NOTE — Telephone Encounter (Signed)
 Infusion scheduled 7/25 (prior to 7/29 as previously scheduled).  No further follow up needed.

## 2023-10-15 DIAGNOSIS — S81802A Unspecified open wound, left lower leg, initial encounter: Secondary | ICD-10-CM | POA: Diagnosis not present

## 2023-10-16 ENCOUNTER — Other Ambulatory Visit: Admitting: Urology

## 2023-10-16 DIAGNOSIS — D696 Thrombocytopenia, unspecified: Secondary | ICD-10-CM | POA: Diagnosis not present

## 2023-10-16 DIAGNOSIS — I5042 Chronic combined systolic (congestive) and diastolic (congestive) heart failure: Secondary | ICD-10-CM | POA: Diagnosis not present

## 2023-10-16 DIAGNOSIS — I700 Atherosclerosis of aorta: Secondary | ICD-10-CM | POA: Diagnosis not present

## 2023-10-16 DIAGNOSIS — S4292XD Fracture of left shoulder girdle, part unspecified, subsequent encounter for fracture with routine healing: Secondary | ICD-10-CM | POA: Diagnosis not present

## 2023-10-16 DIAGNOSIS — D61818 Other pancytopenia: Secondary | ICD-10-CM | POA: Diagnosis not present

## 2023-10-16 DIAGNOSIS — I110 Hypertensive heart disease with heart failure: Secondary | ICD-10-CM | POA: Diagnosis not present

## 2023-10-16 DIAGNOSIS — E119 Type 2 diabetes mellitus without complications: Secondary | ICD-10-CM | POA: Diagnosis not present

## 2023-10-16 DIAGNOSIS — I272 Pulmonary hypertension, unspecified: Secondary | ICD-10-CM | POA: Diagnosis not present

## 2023-10-16 DIAGNOSIS — I4821 Permanent atrial fibrillation: Secondary | ICD-10-CM | POA: Diagnosis not present

## 2023-10-16 DIAGNOSIS — E662 Morbid (severe) obesity with alveolar hypoventilation: Secondary | ICD-10-CM | POA: Diagnosis not present

## 2023-10-16 DIAGNOSIS — S81802A Unspecified open wound, left lower leg, initial encounter: Secondary | ICD-10-CM | POA: Diagnosis not present

## 2023-10-17 ENCOUNTER — Telehealth: Payer: Self-pay

## 2023-10-17 ENCOUNTER — Inpatient Hospital Stay

## 2023-10-17 ENCOUNTER — Telehealth: Payer: Self-pay | Admitting: *Deleted

## 2023-10-17 ENCOUNTER — Ambulatory Visit: Payer: Self-pay

## 2023-10-17 VITALS — BP 120/70 | HR 83 | Temp 97.7°F

## 2023-10-17 DIAGNOSIS — Z87891 Personal history of nicotine dependence: Secondary | ICD-10-CM | POA: Diagnosis not present

## 2023-10-17 DIAGNOSIS — D509 Iron deficiency anemia, unspecified: Secondary | ICD-10-CM | POA: Diagnosis not present

## 2023-10-17 DIAGNOSIS — Z800 Family history of malignant neoplasm of digestive organs: Secondary | ICD-10-CM | POA: Diagnosis not present

## 2023-10-17 DIAGNOSIS — Z79899 Other long term (current) drug therapy: Secondary | ICD-10-CM | POA: Diagnosis not present

## 2023-10-17 DIAGNOSIS — Z803 Family history of malignant neoplasm of breast: Secondary | ICD-10-CM | POA: Diagnosis not present

## 2023-10-17 DIAGNOSIS — D631 Anemia in chronic kidney disease: Secondary | ICD-10-CM | POA: Diagnosis not present

## 2023-10-17 DIAGNOSIS — Z940 Kidney transplant status: Secondary | ICD-10-CM | POA: Diagnosis not present

## 2023-10-17 DIAGNOSIS — N184 Chronic kidney disease, stage 4 (severe): Secondary | ICD-10-CM | POA: Diagnosis not present

## 2023-10-17 DIAGNOSIS — D508 Other iron deficiency anemias: Secondary | ICD-10-CM

## 2023-10-17 MED ORDER — SODIUM CHLORIDE 0.9% FLUSH
10.0000 mL | Freq: Once | INTRAVENOUS | Status: AC | PRN
  Administered 2023-10-17: 10 mL
  Filled 2023-10-17: qty 10

## 2023-10-17 MED ORDER — IRON SUCROSE 20 MG/ML IV SOLN
200.0000 mg | INTRAVENOUS | Status: DC
  Administered 2023-10-17: 200 mg via INTRAVENOUS
  Filled 2023-10-17: qty 10

## 2023-10-17 NOTE — Telephone Encounter (Signed)
 Called pt and was talking to him, but the call dropped. I called back and left detailed message.

## 2023-10-17 NOTE — Telephone Encounter (Signed)
 Copied from CRM #[id number]. Topic: Clinical - Home Health Verbal Orders >> Oct 17, 2023  8:40 AM Donna BRAVO wrote: Caller/Agency: [facility] Callback Number: [phone] Service Requested: Occupational Therapy Frequency: 2 a week for 6 weeks Any new concerns about the patient? No   [name] has noticed tremors on left upper extremity, unsure what was causing the tremor.

## 2023-10-17 NOTE — Telephone Encounter (Signed)
 FYI Only or Action Required?: Action required by provider: request for appointment.  Patient was last seen in primary care on 10/01/2023 by [name], MD.  Called Nurse Triage reporting Tremors.  Symptoms began about a month ago.  Interventions attempted: Nothing.  Symptoms are: unchanged.  Triage Disposition: See PCP When Office is Open (Within 3 Days)  Patient/caregiver understands and will follow disposition?: YesCopied from CRM #[id number]. Topic: Clinical - Red Word Triage >> Oct 17, 2023  8:43 AM Donna BRAVO wrote: Red Word that prompted transfer to Nurse Triage: Caller/Agency: [facility] Callback Number: [phone]  [name] has noticed tremors on left upper extremity, unsure what was causing the tremor. Very random Reason for Disposition  [1] Muscle jerks or tics without an obvious cause AND [2] brief (seconds, gone now) AND [3] otherwise feels normal (well)  Answer Assessment - Initial Assessment Questions 1. APPEARANCE of MOVEMENT: What did the jerking or twitching look like? (e.g., body area)     Tremors- left arm  2. ONSET: When did this start happening? (e.g., hours, days, weeks, months ago)     June or july 3. DURATION: How long does the jerk, twitch, or spasm last?     Few seconds 4. FREQUENCY:  How often does this happen?      random 5. WHEN: When does this happen? (e.g., while awake, while falling asleep, while sleeping)     Moving or sitting still  6. CAUSE: What do you think caused the ?     Not sure-pain 7. OTHER SYMPTOMS: Are there any other symptoms? (e.g., fever, headache)     denies   Connie,OT, from [facility] called to report her findings from yesterday visit. Random tremors that last seconds.  Pt fell and broke collarbone-middle of june.  Pt doesn't want to take hydrocodone  due to stomach issues. Pt is taking tylenol  for pain.RN  called pt. Pt stated if feels like my shoulder feels like a vibrator is making my arm  move. Pt is concerned with weight. I'm not eating much and not losing weight. I had edema  in stomach area after fall. Maybe that's it.  Pt couldn't  do Monday appt due to PT. Pt has appt with PCP 7/29.  Protocols used: Muscle Jerks - Tics - [facility]

## 2023-10-17 NOTE — Telephone Encounter (Signed)
 Patient called and said he would like to move up the iron  infusion from 12:30: to earlier today.  I spoke to the staff infusion and they says yes and he will be here 12 min. Changed the time to 11:30 and Amy was sending message that  registration will know that he can come in 11:30

## 2023-10-17 NOTE — Telephone Encounter (Signed)
Left detailed message on verified VM. 

## 2023-10-20 DIAGNOSIS — I272 Pulmonary hypertension, unspecified: Secondary | ICD-10-CM | POA: Diagnosis not present

## 2023-10-20 DIAGNOSIS — S4292XD Fracture of left shoulder girdle, part unspecified, subsequent encounter for fracture with routine healing: Secondary | ICD-10-CM | POA: Diagnosis not present

## 2023-10-20 DIAGNOSIS — D61818 Other pancytopenia: Secondary | ICD-10-CM | POA: Diagnosis not present

## 2023-10-20 DIAGNOSIS — I4821 Permanent atrial fibrillation: Secondary | ICD-10-CM | POA: Diagnosis not present

## 2023-10-20 DIAGNOSIS — E119 Type 2 diabetes mellitus without complications: Secondary | ICD-10-CM | POA: Diagnosis not present

## 2023-10-20 DIAGNOSIS — E662 Morbid (severe) obesity with alveolar hypoventilation: Secondary | ICD-10-CM | POA: Diagnosis not present

## 2023-10-20 DIAGNOSIS — D696 Thrombocytopenia, unspecified: Secondary | ICD-10-CM | POA: Diagnosis not present

## 2023-10-20 DIAGNOSIS — I110 Hypertensive heart disease with heart failure: Secondary | ICD-10-CM | POA: Diagnosis not present

## 2023-10-20 DIAGNOSIS — I5042 Chronic combined systolic (congestive) and diastolic (congestive) heart failure: Secondary | ICD-10-CM | POA: Diagnosis not present

## 2023-10-20 DIAGNOSIS — I700 Atherosclerosis of aorta: Secondary | ICD-10-CM | POA: Diagnosis not present

## 2023-10-21 ENCOUNTER — Other Ambulatory Visit: Payer: Self-pay

## 2023-10-21 ENCOUNTER — Inpatient Hospital Stay (HOSPITAL_BASED_OUTPATIENT_CLINIC_OR_DEPARTMENT_OTHER): Admitting: Nurse Practitioner

## 2023-10-21 ENCOUNTER — Ambulatory Visit: Admitting: Internal Medicine

## 2023-10-21 ENCOUNTER — Inpatient Hospital Stay

## 2023-10-21 VITALS — BP 134/72 | HR 66 | Temp 96.6°F | Resp 17 | Ht 70.0 in | Wt 247.4 lb

## 2023-10-21 DIAGNOSIS — D509 Iron deficiency anemia, unspecified: Secondary | ICD-10-CM | POA: Diagnosis not present

## 2023-10-21 DIAGNOSIS — I110 Hypertensive heart disease with heart failure: Secondary | ICD-10-CM | POA: Diagnosis not present

## 2023-10-21 DIAGNOSIS — E119 Type 2 diabetes mellitus without complications: Secondary | ICD-10-CM | POA: Diagnosis not present

## 2023-10-21 DIAGNOSIS — E662 Morbid (severe) obesity with alveolar hypoventilation: Secondary | ICD-10-CM | POA: Diagnosis not present

## 2023-10-21 DIAGNOSIS — D631 Anemia in chronic kidney disease: Secondary | ICD-10-CM | POA: Diagnosis not present

## 2023-10-21 DIAGNOSIS — I272 Pulmonary hypertension, unspecified: Secondary | ICD-10-CM | POA: Diagnosis not present

## 2023-10-21 DIAGNOSIS — N184 Chronic kidney disease, stage 4 (severe): Secondary | ICD-10-CM | POA: Diagnosis not present

## 2023-10-21 DIAGNOSIS — Z803 Family history of malignant neoplasm of breast: Secondary | ICD-10-CM | POA: Diagnosis not present

## 2023-10-21 DIAGNOSIS — D696 Thrombocytopenia, unspecified: Secondary | ICD-10-CM | POA: Diagnosis not present

## 2023-10-21 DIAGNOSIS — Z800 Family history of malignant neoplasm of digestive organs: Secondary | ICD-10-CM | POA: Diagnosis not present

## 2023-10-21 DIAGNOSIS — I5042 Chronic combined systolic (congestive) and diastolic (congestive) heart failure: Secondary | ICD-10-CM | POA: Diagnosis not present

## 2023-10-21 DIAGNOSIS — I4821 Permanent atrial fibrillation: Secondary | ICD-10-CM | POA: Diagnosis not present

## 2023-10-21 DIAGNOSIS — D508 Other iron deficiency anemias: Secondary | ICD-10-CM

## 2023-10-21 DIAGNOSIS — I700 Atherosclerosis of aorta: Secondary | ICD-10-CM | POA: Diagnosis not present

## 2023-10-21 DIAGNOSIS — Z87891 Personal history of nicotine dependence: Secondary | ICD-10-CM | POA: Diagnosis not present

## 2023-10-21 DIAGNOSIS — Z940 Kidney transplant status: Secondary | ICD-10-CM | POA: Diagnosis not present

## 2023-10-21 DIAGNOSIS — Z79899 Other long term (current) drug therapy: Secondary | ICD-10-CM | POA: Diagnosis not present

## 2023-10-21 DIAGNOSIS — D61818 Other pancytopenia: Secondary | ICD-10-CM | POA: Diagnosis not present

## 2023-10-21 DIAGNOSIS — S4292XD Fracture of left shoulder girdle, part unspecified, subsequent encounter for fracture with routine healing: Secondary | ICD-10-CM | POA: Diagnosis not present

## 2023-10-21 MED ORDER — IRON SUCROSE 20 MG/ML IV SOLN
200.0000 mg | INTRAVENOUS | Status: DC
  Administered 2023-10-21: 200 mg via INTRAVENOUS
  Filled 2023-10-21: qty 10

## 2023-10-21 NOTE — Progress Notes (Signed)
 Advanced [facility] Failure Clinic Note   Date:  10/24/2023   ID:  [name]., DOB February 23, 1961, MRN [id number]   Provider location: [address] [place] Type of Visit:  Established patient  PCP:  [name], MD  Cardiologist:  Dr. Darron Nephrology: Dr. Joshua  HF Cardiologist: Dr. Rolan  HPI: [name]. is a 63 y.o. male  with a history of CAD s/p CABG 2005, chronic atrial fibrillation on Eliquis , ESRD s/p renal transplant 2012, and chronic diastolic CHF.  His renal function has been fairly stable, most recent creatinine 1.43.  He has been struggling with volume overload/CHF.  He has a history of ischemic cardiomyopathy with EF as low as 35-40% in the past but echo in 3/20 showed EF 60-65%.  There is evidence for RV failure with pulmonary hypertension by echo.    Admitted 11/05/18 low grade fever and shortness of breath. HS Trop negative. Covid 19 negative. Blood CX negative. Placed on antibiotics and discharged to home the next day.   Admitted 05/03/19 with A/C Diastolic HF. Diuresed with IV lasix  and transitioned to torsemide  80/40 mg . Discharge weight 252 pounds.   Admitted 06/2019 with left arm pain. Left upper extremity venous Doppler revealed occluded cephalic vein at the AV fistula outflow tract in the antecubital fossa with no other thrombus identified. Vascular evaluated. He continued on Eliquis .  No plan for intervention. Also treated for acute gout flare. Discharged on prednisone  taper.     Given IV lasix  on 12/21 by HF Paramedicine.   Admitted to [facility] 2/22 with acute gout flare vs cellulitis. Nephrology was consulted to evaluate transplant regimen and follow along. He was treated with antibiotics and prednisone . Discharged on decreased torsemide  dose of 40 mg daily. Weight 259 lbs.  Admitted 4/22 for left leg cellulitis after sustaining a dog scratch, received IV antibiotics. No changes to his diuretic therapy. Discharge weight 266  lbs.  Follow up with Dr. Darron, 1/23, he had CP and underwent Lexiscan  myoview , which overall was a low risk study with evidence of prior infarct in the infero-apical area with no significant change from before. Losartan  added to GDMT regimen.  Follow up 5/23, volume overloaded, REDs 40%. Advised taking additional metolazone  (3 doses that week), however labs showed worsening renal function and advised to keep at 2 doses.  Admitted in 5/23 with acute on chronic CHF and AKI.  Spiro, Farxiga  and losartan  stopped. Diuresed with IV lasix . Echo showed EF 40-45%, RV mildly reduced, GIIIDD (restrictive), IVC dilated. Nephrology consulted for diuretic dosing. Discharged home, weight 266 lbs.  He had chest pain in 8/23 and was admitted at [facility], minimal HS-TnI elevation with no trend, thought to be demand ischemia from volume overload.    Echo 8/23 showing EF 40-45%, diffuse hypokinesis with dyssynchrony, mildly dilated RV with mildly decreased systolic function, IVC dilated, moderate AS with mean gradient 15, mild-moderate mitral stenosis with mean gradient 6.   Follow up 03/27/22, volume overloaded with 25 lbs weight gain. Instructed to use Furoscix  x 3 days, then resume home dose of torsemide  + metolazone . Clinic weight 277 lbs.  Admitted from [facility] 04/01/22 with a/c CHF. Echo showed EF 40-45%, RV moderately reduced and RVSP 49 mmHg. RHC showed elevated filling pressures, moderate primarily pulmonary venous hypertension and preserved CO. Tadalafil  stopped with primarily PH group 2 and 3. He was diuresed with IV lasix  and metolazone , and GDMT added back. Overall down 19 lbs. He was  discharged home, weight 255 lbs.  Admitted in 3/24 with Serratia bacteremia/pyelonephritis, treated with ertapenem .  He also had campylobacter diarrhea.  In 4/24, he tripped getting off his riding lawnmower and fell, fracturing ribs. He had a pulmonary contusion with this.   Admitted 11/24 with slurred speech and profuse diarrhea.  MRI  brain showed no acute stroke.  He had AKI on CKD stage IV with creatinine up to 4.3, presentation thought to be due to diarrhea with dehydration related to use of Mounjaro .  He was given IV fluid and diuretics were stopped.  Losartan  and spironolactone  were stopped. He was sent home without diuretics.   Echo in 12/24 was stable with EF 45-50%, low normal RV function, mild MR.  Zio monitor for bradycardia in 12/24 showed average HR 52, no pauses, 4.7% PVCs.   Patient fell while on a cruise in 1/25 and broke ribs on the right (mechanical fall).  He was later admitted in 1/25 with influenza A and suspected development of secondary bacterial PNA.  6/25 had another mechanical fall and fractured his shoulder.   Admitted 7/25 with another mechanical fall & subsequent severe persistent epistaxis. Eliquis  held. He required nasal packing by ENT.  Initial head CT demonstrated small volume hemorrhage layering in the maxillary sinuses, and fluid in the visible pharynx. He had an acute change in mental status, with right-sided weakness. Code stroke called. Repeat head CT negative. MRI of the brain showed mild nonspecific sinus disease with mild air-fluid levels which can be seen in acute sinusitis, with no evidence of stroke. EEG without evidence of seizure  or epileptiform discharges. Symptoms resolved. Echo showed EF 40 to 45%, RV systolic function not well visualized. He was diuresed and GDMT titrated, but initially limited by elevated creatinine. Nasal packing removed and he was instructed to remain off of Eliquis  until f/u.   Today he returns for HF follow up. Overall feeling fine. He has SOB walking up steep hills. Has swelling around midsection, feels he has 5 lbs of fluid on board. Denies palpitations, abnormal bleeding, CP, dizziness, edema, or PND/Orthopnea. Appetite fair, on Mounjaro . Weight at home 240 pounds. Taking all medications. Usually takes metolazone  1-2x/week.  REDS clip 37%, abnormal   ECG  (personally reviewed): none ordered today.  Labs (1/24): K 4.6, creatinine 2.44 Labs (2/24): K 3.4, creatinine 2.75, LDL 25 Labs (4/24): K 4.1, creatinine 2.46 Labs (5/24): K 2.7, creatinine 2.25, IgG monoclonal protein on myeloma panel.  Labs (6/24): K 3.8, creatinine 2.58 Labs (9/24): BNP 265, K 4.1, creatinine 2.83, LFTs normal, hgb 13.6 Labs (11/24): K 3.3 => 3.7, creatinine 4.3 => 1.88 => 2.08 Labs (1/25): LDL 17, K 4.9, creatinine 3.3 Labs (5/25): BNP 241, K 3.8, creatinine 2.59, LDL 18 Labs (7/25): K 4.2, creatinine 2.96  PMH: 1. CAD: s/p CABG x 4 in [place]  in 2005.  - Cardiolite (1/20): EF 47%, small fixed apical septal defect, no ischemia.  Low risk study.  - Cardiolite (12/22): small apical inferior defect, no changes from prior, no ischemia. Low risk study. 2. Asthma 3. Gout 4. Atrial fibrillation: Chronic.  5. ESRD s/p renal transplantation at [facility] in 2012.  6. Anemia of chronic disease 7. Type II diabetes 8. HTN 9. Hyperlipidemia 10. OSA: On bipap.  11. Ischemic cardiomyopathy: EF 35-40% in past.  - Cardiolite (1/20) with EF 47%.  - Echo (1/20): EF 50-55%, mild MR, RV moderately dilated with moderately decreased systolic function, PASP 73 mmHg, moderate TR.   - Echo (3/20):  EF 60-65%, PASP 75 mmHg, mildly dilated RV with mildly decreased systolic function.  - Echo (2/21): EF 45-50%, global HK, moderately dilated RV with moderately decreased systolic function, D-shaped interventricular septum, dilated IVC.  - RHC (2/21): mean RA 19, PA 78/31 mean 51, mean PCWP 26, CI 3.26, PVR 3.25 - Echo (5/23): EF 40-45%, RV mildly reduced, GIIIDD (restrictive) - Echo (8/23): EF 40-45%, diffuse hypokinesis with dyssynchrony, mildly dilated RV with mildly decreased systolic function, IVC dilated, moderate AS with mean gradient 15, mild-moderate mitral stenosis with mean gradient 6.  - Echo (1/24): Echo showed EF 40-45%, RV moderately reduced and RVSP 49 mmHg, mild MR, no AS,  dilated IVC.  - RHC (1/24): RA mean 13, PA 62/24 (mean 40), PCWP mean 20, CO/CI (Fick) 7.63/3.26, PVR 2.6 WU, PAPi 2.9 - Echo (12/24): EF 45-50%, low normal RV function, mild MR. - Echo 7/25: EF 40-45%, RV not well visualized 12. Obesity: s/p gastric bypass in 2015.  13. Bradycardia - Zio monitor (12/24): average HR 52, no pauses, 4.7% PVCs 14. Aortic stenosis: Moderate on 8/23 echo.  15. Mitral stenosis: Mild to moderate on 8/23 echo.  16. Rheumatoid arthritis 17. IgG monoclonal gammopathy 18. PAD: ABIs (10/24) with noncompressible ABIs, normal TBIs.  19. H/o Bell's palsy  Past Surgical History:  Procedure Laterality Date   CARDIAC CATHETERIZATION N/A 08/10/2002   CATARACT EXTRACTION W/PHACO Right 10/29/2017   Procedure: CATARACT EXTRACTION PHACO AND INTRAOCULAR LENS PLACEMENT (IOC)  RIGHT DIABETIC;  Surgeon: Mittie Gaskin, MD;  Location[facility];  Service: Ophthalmology;  Laterality: Right   CATARACT EXTRACTION W/PHACO Left 11/18/2017   Procedure: CATARACT EXTRACTION PHACO AND INTRAOCULAR LENS PLACEMENT (IOC) LEFT IVA/TOPICAL;  Surgeon: Mittie Gaskin, MD;  Location: [facility];  Service: Ophthalmology;  Laterality: Left   COLONOSCOPY WITH PROPOFOL  N/A 04/22/2013   Procedure: COLONOSCOPY WITH PROPOFOL ;  Surgeon: Toribio SHAUNNA Cedar, MD;  Location: [facility];  Service: Endoscopy;  Laterality: N/A;   CORONARY ANGIOPLASTY WITH STENT PLACEMENT N/A 1996   CORONARY ARTERY BYPASS GRAFT N/A 08/13/2002   Procedure: 4v CORONARY ARTERY BYPASS GRAFTING; Location: [facility]; Surgeon: Dessa Laine, MD   CYSTOSCOPY WITH INSERTION OF UROLIFT N/A 12/25/2021   Procedure: CYSTOSCOPY WITH INSERTION OF UROLIFT;  Surgeon: Twylla Glendia BROCKS, MD;  Location: [facility];  Service: Urology;  Laterality: N/A;   DG ANGIO AV SHUNT*L*     right and left upper arms   FASCIOTOMY  03/03/2012   Procedure: FASCIOTOMY;  Surgeon: Arley JONELLE Curia, MD;  Location: [facility];   Service: Orthopedics;  Laterality: Right;  FASCIOTOMY RIGHT SMALL FINGER   FASCIOTOMY Left 08/17/2013   Procedure: FASCIOTOMY LEFT RING;  Surgeon: Arley JONELLE Curia, MD;  Location: [facility];  Service: Orthopedics;  Laterality: Left;   INCISION AND DRAINAGE ABSCESS Left 10/15/2015   Procedure: INCISION AND DRAINAGE ABSCESS;  Surgeon: Laneta JULIANNA Luna, MD;  Location: [facility];  Service: General;  Laterality: Left;   KIDNEY TRANSPLANT  09/13/2010   cadaver--at [facility]   LASER ABLATION CONDOLAMATA N/A 01/08/2023   Procedure: LASER REMOVAL ABLATION OF CONDYLOMATA;  Surgeon: Sheldon Standing, MD;  Location: [facility];  Service: General;  Laterality: N/A;  GEN AND LOCAL   RECTAL EXAM UNDER ANESTHESIA N/A 01/08/2023   Procedure: ANORECTAL EXAM UNDER ANESTHESIA;  Surgeon: Sheldon Standing, MD;  Location: [facility];  Service: General;  Laterality: N/A;   RIGHT HEART CATH N/A 11/15/2016   Procedure: RIGHT HEART CATH;  Surgeon: Darron Deatrice LABOR, MD;  Location: [facility];  Service: Cardiovascular;  Laterality: N/A;   RIGHT HEART CATH N/A 05/03/2019   Procedure: RIGHT HEART CATH;  Surgeon: [name], MD;  Location: [facility] CV LAB;  Service: Cardiovascular;  Laterality: N/A;   RIGHT HEART CATH N/A 04/04/2022   Procedure: RIGHT HEART CATH;  Surgeon: [name], MD;  Location: [facility] INVASIVE CV LAB;  Service: Cardiovascular;  Laterality: N/A;   ROUX-EN-Y GASTRIC BYPASS N/A 2015   TYMPANIC MEMBRANE REPAIR Left 03/2010   VASECTOMY     WART FULGURATION N/A 05/31/2022   Procedure: FULGURATION ANAL WART;  Surgeon: Tye Millet, DO;  Location: [facility];  Service: General;  Laterality: N/A;   Current Outpatient Medications  Medication Sig Dispense Refill   albuterol  (PROVENTIL ) (2.5 MG/3ML) 0.083% nebulizer solution Take 3 mLs (2.5 mg total) by nebulization every 6 (six) hours as needed for wheezing or shortness of breath. 150 mL 0   albuterol  (VENTOLIN  HFA) 108 (90 Base) MCG/ACT inhaler  Inhale 2 puffs into the lungs every 6 (six) hours as needed for wheezing or shortness of breath. 6.7 g 5   apixaban  (ELIQUIS ) 5 MG TABS tablet Take 1 tablet (5 mg total) by mouth 2 (two) times daily. 180 tablet 3   buPROPion  (WELLBUTRIN  SR) 150 MG 12 hr tablet Take 1 tablet (150 mg total) by mouth 2 (two) times daily. 180 tablet 3   Continuous Glucose Sensor (FREESTYLE LIBRE 3 SENSOR) MISC Use one every 2 weeks. 6 each 3   Cyanocobalamin  (B-12 PO) Take 1 tablet by mouth daily.     dapagliflozin  propanediol (FARXIGA ) 10 MG TABS tablet Take 1 tablet (10 mg total) by mouth daily before breakfast. 90 tablet 3   febuxostat  (ULORIC ) 40 MG tablet Take 3 tablets (120 mg total) by mouth daily. 90 tablet 5   fluticasone  (FLONASE ) 50 MCG/ACT nasal spray Place 2 sprays into both nostrils daily. 16 g 11   gabapentin  (NEURONTIN ) 300 MG capsule Take 1 capsule (300 mg total) by mouth 2 (two) times daily. 180 capsule 3   HYDROcodone -acetaminophen  (NORCO/VICODIN) 5-325 MG tablet Take 1 tablet by mouth every 6 (six) hours as needed for moderate pain (pain score 4-6). 60 tablet 0   insulin  glargine-yfgn (SEMGLEE ) 100 UNIT/ML Pen Inject 20 Units into the skin daily. 45 mL 3   Insulin  Pen Needle (TECHLITE PEN NEEDLES) 31G X 5 MM MISC 4 (four) times daily. 400 each 3   Insulin  Pen Needle (UNIFINE PENTIPS) 31G X 5 MM MISC Use 4 times daily as directed 400 each 3   losartan  (COZAAR ) 25 MG tablet Take 1 tablet (25 mg total) by mouth daily. 90 tablet 3   metolazone  (ZAROXOLYN ) 2.5 MG tablet Take 1 tablet (2.5 mg total) by mouth as directed. By the heart failure clinic 30 tablet 1   montelukast  (SINGULAIR ) 10 MG tablet Take 1 tablet (10 mg total) by mouth at bedtime. 30 tablet 11   mycophenolate  (MYFORTIC ) 180 MG EC tablet Take 2 tablets (360 mg total) by mouth every morning AND 1 tablet (180 mg total) every evening. 360 tablet 3   omeprazole  (PRILOSEC) 20 MG capsule Take 1 capsule (20 mg total) by mouth daily. 30 capsule 11    potassium chloride  SA (KLOR-CON  M) 20 MEQ tablet Take 1 tablet (20 mEq total) by mouth daily as needed. 90 tablet 3   predniSONE  (DELTASONE ) 5 MG tablet Take 1 tablet (5 mg total) by mouth daily. 90 tablet 3   rosuvastatin  (CRESTOR )  10 MG tablet Take 1 tablet (10 mg total) by mouth daily. 90 tablet 3   silver  sulfADIAZINE  (SILVADENE ) 1 % cream Apply 1 Application topically daily. 50 g 1   sodium chloride  (OCEAN) 0.65 % SOLN nasal spray Place 2 sprays into both nostrils every 8 (eight) hours for 10 days.     sulfamethoxazole -trimethoprim  (BACTRIM ) 400-80 MG tablet Take 1 tablet by mouth 3 (three) times a week. 36 tablet 3   tacrolimus  (PROGRAF ) 1 MG capsule Take 2 capsules (2 mg total) by mouth every morning AND 1 capsule (1 mg total) every evening. 90 capsule 11   tadalafil  (CIALIS ) 5 MG tablet Take 1 tablet (5 mg total) by mouth daily as needed for erectile dysfunction. 90 tablet 0   tamsulosin  (FLOMAX ) 0.4 MG CAPS capsule Take 1 capsule (0.4 mg total) by mouth daily. 30 capsule 11   Testosterone  20.25 MG/ACT (1.62%) GEL Place 2 Pump onto the skin daily. 150 g 3   tirzepatide  (MOUNJARO ) 5 MG/0.5ML Pen Inject 5 mg into the skin once a week. 2 mL 5   tiZANidine  (ZANAFLEX ) 2 MG tablet Take 1 tablet (2 mg total) by mouth every 6 (six) hours as needed for muscle spasms. 30 tablet 1   torsemide  (DEMADEX ) 20 MG tablet Take 4 tablets (80 mg total) by mouth 2 (two) times daily. 240 tablet 3   Zinc  50 MG TABS Take 1 tablet (50 mg total) by mouth daily. 100 tablet 3   No current facility-administered medications for this encounter.   Allergies:   Iodinated contrast media, Iodine, Acetaminophen , Nsaids, and Ibuprofen   Social History:  The patient  reports that he quit smoking about 29 years ago. His smoking use included cigars. He has never been exposed to tobacco smoke. He has never used smokeless tobacco. He reports that he does not currently use alcohol. He reports that he does not use drugs.    Family History:  The patient's family history includes Breast cancer in his maternal grandmother; Diabetes in his maternal grandmother; Heart disease in his father; Kidney disease in his father; Kidney failure in his father; Liver cancer in his paternal grandmother and paternal uncle; Valvular heart disease in his mother.   ROS:  Please see the history of present illness.   All other systems are personally reviewed and negative.   Wt Readings from Last 3 Encounters:  10/24/23 111.7 kg (246 lb 3.2 oz)  10/21/23 112.2 kg (247 lb 6.4 oz)  10/06/23 113.4 kg (250 lb)    BP 112/68   Pulse 72   Wt 111.7 kg (246 lb 3.2 oz)   SpO2 96%   BMI 35.33 kg/m   Physical Exam: General:  NAD. No resp difficulty, walked into clinic HEENT: Normal Neck: Supple. No JVD. Cor: Irregular rate & rhythm. No rubs, gallops or murmurs. Lungs: Clear Abdomen: Soft, nontender, nondistended.  Extremities: No cyanosis, clubbing, rash, 1+ BLE edema w/ chronic venous changes to lower egs Neuro: Alert & oriented x 3, moves all 4 extremities w/o difficulty. Affect pleasant.  Assessment & Plan: 1. Chronic heart failure with Mid Range EF/ With prominent RV failure/pulmonary hypertension:  Ischemic cardiomyopathy.  ?If RV failure is related to severe OSA, he was a remote smoker. Echo in 2/21 with EF 45-50%, moderately dilated RV with moderately decreased systolic function, moderate pulmonary hypertension. Echo in 12/24 showed EF 45-50%, low normal RV function, mild MR. Echo 7/25 EF 40-45%, RV not well visualized. Management complicated by CKD stage 4.  NYHA  III, volume improving but still up, REDs 37%. - Increase torsemide  to 100 mg bid. Recent labs reviewed and are stable, repeat BMET in 7-10 days - Take metolazone  2.5 mg + 20 KCL today. Suspect he will need 2x/weekly going forward - Continue Farxiga  10 mg daily - Continue losartan  25 mg daily.  - No spironolactone  with CKD stage IV.  2. Atrial fibrillation: Chronic.  Rate controlled.  - Continue Eliquis   5 mg bid. He would like to eventually stop anticoagulation given numerous mechanical falls. 4/24 he fell getting off his riding lawnmower, fracturing ribs. 1/25 fell while on a cruise and broke ribs on the right. 6/25 mechanical fall resulting in left shoulder fx, 7/25 mechanical fall w/ subsequent severe epistaxis requiring nasal packing.  - Referred to EP for consideration for Watchman (Dr. Cindie). He has been advised to use extreme caution w/ ambulation.  - Not on beta blocker with h/o bradycardia.   3. CAD: S/p CABG 2005.  Cardiolite 12/22 with prior MI, no ischemia. High threshold for cath given CKD. He denies chest pain  - No ASA given Eliquis  use.  - Continue Crestor  10 mg daily. Good lipids in 1/25.   4. CKD Stage 4: S/p renal transplantation in 2012. Followed by transplant center at [facility]. He remains on mycophenolate  and tacrolimus .   - Continue SGLT2i .  5. OSA: Reports compliance w/ BiPAP. - No change. 6. HTN: Controlled on current regimen  - Continue current dose of losartan . 7. DM 2: On insulin . Managed by Endocrinology. - Continue dapagliflozin  for now but watch for further GU symptoms. - He is on tirzepatide . 8. Pulmonary hypertension: Primarily pulmonary venous hypertension by RHC in 1/24 though there may be group 3 PH component from OSA.  9. Chronic Anemia: Gets iron  infusions.  10. IgG monoclonal protein: followed by hematologist.  11. Bradycardia: Suspect tachy-brady syndrome. HR generally in 50s-60s.  Zio monitor in 12/24 did not show worrisome bradyarrhythmias (avg HR in 50s, no long pauses). He denies any syncope or dizziness associated w/ recent falls (all seem strictly mechanical)  - No nodal blockers.  - May need pacemaker in future.   Follow up in 2 months with Dr. Rolan, at [facility] office.   Harlene HERO Maben, FNP-BC 10/24/2023

## 2023-10-21 NOTE — Progress Notes (Signed)
 Tired all the time and cold. [name] second week of June, left shoulder fracture.

## 2023-10-21 NOTE — Progress Notes (Signed)
 Hematology/Oncology Consult note [facility]  Telephone:([phone] [phone]  Patient Care Team: [name], MD as PCP - General (Pediatrics) [name], MD as PCP - Advanced Heart Failure (Cardiology) Darron Deatrice LABOR, MD as PCP - Cardiology (Cardiology) Donette Ellouise LABOR, FNP as Nurse Practitioner (Family Medicine) [name], MD as Consulting Physician (Cardiology) Tye Millet, DO as Consulting Physician (General Surgery) Sheldon Standing, MD as Consulting Physician (Colon and Rectal Surgery) Auston Elsie Hero, PA-C as Physician Assistant (Transplant) Melanee Annah BROCKS, MD as Consulting Physician (Oncology)   Name of the patient: [name]  [id number]  02-21-1961   Date of visit: 10/21/23  Diagnosis-anemia of chronic kidney disease  Chief complaint/ Reason for visit-routine follow-up of anemia  Heme/Onc history: patient is a 63 year old male with a past medical history is significant for stage IV CKD, ischemic cardiomyopathy, atrial fibrillation, hypertension hyperlipidemia, CAD among other medical problems.  He has undergone kidney transplant back in June 2012.  He is presently on maintenance tacrolimus  and mycophenolate .  He has been referred to us  for anemia.  CBC from 07/08/2022 showed white cell count of 5, H&H of 10.6/37 with an MCV of 82 and a platelet count of 150.  Looking back at his CBC his hemoglobin has been around 10 for the last 3 months.  Up until June 2023 his hemoglobin was close to 12.   Patient has been following up with Dr. Cathryne Molt from [facility] nephrology.  He has been receiving EPO as well as iron  injections through them.  He last received IV iron  about 3 weeks ago.  He has been receiving EPO injections about once a month and would like to transition over here for EPO and iron     Interval history-patient is a 63 year old male who returns to clinic for routine follow-up of his anemia of chronic kidney  disease. He had two falls with injury including shoulder fracture and facial fractures. Feels tired and generally run down.   ECOG PS- 1 Pain scale- 0  Review of systems- Review of Systems  Constitutional:  Positive for malaise/fatigue. Negative for chills, fever and weight loss.  HENT:  Negative for congestion, ear discharge and nosebleeds.   Eyes:  Negative for blurred vision.  Respiratory:  Negative for cough, hemoptysis, sputum production, shortness of breath and wheezing.   Cardiovascular:  Negative for chest pain, palpitations, orthopnea and claudication.  Gastrointestinal:  Negative for abdominal pain, blood in stool, constipation, diarrhea, heartburn, melena, nausea and vomiting.  Genitourinary:  Negative for dysuria, flank pain, frequency, hematuria and urgency.  Musculoskeletal:  Positive for falls and joint pain. Negative for back pain and myalgias.  Skin:  Negative for rash.  Neurological:  Negative for dizziness, tingling, focal weakness, seizures, weakness and headaches.  Endo/Heme/Allergies:  Does not bruise/bleed easily.  Psychiatric/Behavioral:  Negative for depression and suicidal ideas. The patient does not have insomnia.     Allergies  Allergen Reactions   Iodinated Contrast Media Other (See Comments)    Kidney transplant  Iodinated contrast media (substance)   Iodine Other (See Comments)    Kidney transplant   Acetaminophen  Other (See Comments)    BC of transplant  acetaminophen    Nsaids Other (See Comments)    Kidney transplant   Ibuprofen Other (See Comments)    Due to kidney transplant   Past Medical History:  Diagnosis Date   Anal condyloma    Anemia    Aortic atherosclerosis (HCC)    Aortic  stenosis    a.) TTE 10/2021: Mod AS. AVA 1.06cm^2 (VTI). Mean grad ; b.) TTE 04/02/2022: no AV stenosis   Asthma, persistent controlled 02/25/2013   Attention or concentration deficit 04/26/2021   BPH with obstruction/lower urinary tract symptoms  09/25/2014   CAD (coronary artery disease) 2005   a.) LHC 1996 -> HG stenosis mLAD -> PTCA/PCI with stent x 1 (unk type) -> complicated by CFA pseudoanurysm (required surg);  b.) LHC 08/10/2002  -> 95% LAD, 70% ISR LAD, 80% pOM, 70% mRCA --> CVTS consult; c.) 4v CABG 08/13/2002; c.) 03/2018 MV: Small, fixed inferoapical and apical sep defect. No isc. EF 47% (60-65% by 05/2018 TTE); d.) 02/2021 MV: small Apical inf fixed defect. No isc -> low risk.   Cardiomegaly    Cellulitis and abscess of left leg 07/22/2020   Chronic atrial fibrillation    a.) CHA2DS2VASc = 4 (CHF, HTN, vascular disease history, T2DM);  b.) rate/rhythm maintained without pharmacological intervention; chronically anticoagulated with apixaban    Chronic combined systolic and diastolic CHF (congestive heart failure)    a.) TTE 7/18 : EF 45-50%; b.) TTE 05/2018 : EF 60-65%, RVSP 74.8; c.) TTE 12/2018: EF 50-55%. Sev dil LA; d.) TTE 04/2019: EF 45-50%; e.) TTE 07/2021: EF 40-45%, G3DD; f.) TTE 10/2021: EF 40-45%, mild conc LVH, septal-lat dyssynchrony (LBBB), mildly red RVSF, mild-mod dil LA, mildly dil RA, mild-mod MS, mod AS; g.) TTE 04/02/2022: EF 40-45%, glob HK, LVH, red RVSF, RVE, sev LAE, mild-mod RAE, mild MR   Chronic venous stasis dermatitis of both lower extremities    Cytomegaloviral disease 2017   Diverticulosis of colon 04/22/2013   Dyspnea    Elevated RIGHT hemidiaphragm    Erectile dysfunction    a.) on topical TRT + Trimix (alprostadil/papaverine/phentolamine) injections   ESRD (end stage renal disease) (HCC)    a.) dialysis dependent 2004-2012; b.) s/p cadaveric RIGHT renal transplant 09/13/2010   Essential hypertension 12/17/2010   GERD (gastroesophageal reflux disease)    Gout    History of 2019 novel coronavirus disease (COVID-19) 06/30/2019   History of bilateral cataract extraction 10/2017   History of renal transplant 09/13/2010   a.) s/p cadaveric donor transplant 09/13/2010   Hx of bilateral cataract  extraction 10/2017   Hyperlipidemia    Hypogonadism in male 09/25/2014   Ischemic cardiomyopathy    Left cephalic vein thrombosis 06/2019   Long term current use of anticoagulant    a.) apixaban    Long term current use of immunosuppressive drug    a.) mycophenolate  + prednisone  + tacrolimus    Major depressive disorder 10/04/2013   Mitral stenosis 11/07/2021   a.) TTE 11/07/2021: severe MAC, mild-mod MS (MPG 6 mmHg); b.) TTE 04/02/2022: no MV stenosis   Murmur    Obesity hypoventilation syndrome 03/31/2012   OSA treated with BiPAP 09/29/2012   PAH (pulmonary artery hypertension)    a. 04/2019 RHC: RA 19, RV 80/20, PA 78/31 (51), PCWP 25, CO/CI 7.69/3.26. PVR 3.25 --> Sev PAH, likely primarily PV HTN; b.) RHC 04/04/2022: mRA 13, mPA 40, mPCWP 20, PA sat 59, AO sat 92, CO 7.63, CI 3.26, PVR 2.6 --> mod portal venous HTN   Pancreatitis    S/P CABG x 4 08/13/2002   a.) LIMA-LAD, LRA-OM, SVG-D1, SVG-dRCA   S/P gastric bypass    Synovitis of finger 06/11/2019   Trigger finger, unspecified little finger 12/23/2018   Type 2 diabetes mellitus with hyperglycemia, with long-term current use of insulin  09/09/2014   a.) has FreeStyle  Libre CGM   Ulcer    Left shin goes to wound care center at [facility] qweek   Wears glasses    Wears hearing aid in both ears    Past Surgical History:  Procedure Laterality Date   CARDIAC CATHETERIZATION N/A 08/10/2002   CATARACT EXTRACTION W/PHACO Right 10/29/2017   Procedure: CATARACT EXTRACTION PHACO AND INTRAOCULAR LENS PLACEMENT (IOC)  RIGHT DIABETIC;  Surgeon: Mittie Gaskin, MD;  Location: [facility];  Service: Ophthalmology;  Laterality: Right   CATARACT EXTRACTION W/PHACO Left 11/18/2017   Procedure: CATARACT EXTRACTION PHACO AND INTRAOCULAR LENS PLACEMENT (IOC) LEFT IVA/TOPICAL;  Surgeon: Mittie Gaskin, MD;  Location: [facility];  Service: Ophthalmology;  Laterality: Left   COLONOSCOPY WITH PROPOFOL  N/A 04/22/2013    Procedure: COLONOSCOPY WITH PROPOFOL ;  Surgeon: Toribio SHAUNNA Cedar, MD;  Location: [facility];  Service: Endoscopy;  Laterality: N/A;   CORONARY ANGIOPLASTY WITH STENT PLACEMENT N/A 1996   CORONARY ARTERY BYPASS GRAFT N/A 08/13/2002   Procedure: 4v CORONARY ARTERY BYPASS GRAFTING; Location: [facility]; Surgeon: Dessa Laine, MD   CYSTOSCOPY WITH INSERTION OF UROLIFT N/A 12/25/2021   Procedure: CYSTOSCOPY WITH INSERTION OF UROLIFT;  Surgeon: Twylla Glendia BROCKS, MD;  Location: [facility];  Service: Urology;  Laterality: N/A;   DG ANGIO AV SHUNT*L*     right and left upper arms   FASCIOTOMY  03/03/2012   Procedure: FASCIOTOMY;  Surgeon: Arley JONELLE Curia, MD;  Location: [facility];  Service: Orthopedics;  Laterality: Right;  FASCIOTOMY RIGHT SMALL FINGER   FASCIOTOMY Left 08/17/2013   Procedure: FASCIOTOMY LEFT RING;  Surgeon: Arley JONELLE Curia, MD;  Location:  SURGERY CENTER;  Service: Orthopedics;  Laterality: Left;   INCISION AND DRAINAGE ABSCESS Left 10/15/2015   Procedure: INCISION AND DRAINAGE ABSCESS;  Surgeon: Laneta JULIANNA Luna, MD;  Location: [facility];  Service: General;  Laterality: Left;   KIDNEY TRANSPLANT  09/13/2010   cadaver--at [facility]   LASER ABLATION CONDOLAMATA N/A 01/08/2023   Procedure: LASER REMOVAL ABLATION OF CONDYLOMATA;  Surgeon: Sheldon Standing, MD;  Location: [facility];  Service: General;  Laterality: N/A;  GEN AND LOCAL   RECTAL EXAM UNDER ANESTHESIA N/A 01/08/2023   Procedure: ANORECTAL EXAM UNDER ANESTHESIA;  Surgeon: Sheldon Standing, MD;  Location: [facility];  Service: General;  Laterality: N/A;   RIGHT HEART CATH N/A 11/15/2016   Procedure: RIGHT HEART CATH;  Surgeon: Darron Deatrice LABOR, MD;  Location: [facility] INVASIVE CV LAB;  Service: Cardiovascular;  Laterality: N/A;   RIGHT HEART CATH N/A 05/03/2019   Procedure: RIGHT HEART CATH;  Surgeon: [name], MD;  Location: [facility] INVAS[facility] CV LAB;  Service: Cardiovascular;  Laterality: N/A;   RIGHT HEART CATH N/A  04/04/2022   Procedure: RIGHT HEART CATH;  Surgeon: [name], MD;  Location: [facility] INVASIVE CV LAB;  Service: Cardiovascular;  Laterality: N/A;   ROUX-EN-Y GASTRIC BYPASS N/A 2015   TYMPANIC MEMBRANE REPAIR Left 03/2010   VASECTOMY     WART FULGURATION N/A 05/31/2022   Procedure: FULGURATION ANAL WART;  Surgeon: Tye Millet, DO;  Location: [facility];  Service: General;  Laterality: N/A;    Social History   Socioeconomic History   Marital status: Married    Spouse name: Not on file   Number of children: 2   Years of education: 14   Highest education level: Associate degree: academic program  Occupational History   Occupation: Presenter, broadcasting: unemployed    Comment: disabled due to kidney  failure  Tobacco Use   Smoking status: Former    Types: Cigars    Quit date: 09/02/1994    Years since quitting: 29.1    Passive exposure: Never   Smokeless tobacco: Never  Vaping Use   Vaping status: Never Used  Substance and Sexual Activity   Alcohol use: Not Currently    Comment: occ   Drug use: No   Sexual activity: Not on file  Other Topics Concern   Not on file  Social History Narrative   Has living will   Wife is health care POA---son Donnice is alternate   Would accept resuscitation attempts   No tube feedings if cognitively unaware   Social Drivers of Corporate investment banker Strain: Not on file  Food Insecurity: No Food Insecurity (09/25/2023)   Hunger Vital Sign    Worried About Running Out of Food in the Last Year: Never true    Ran Out of Food in the Last Year: Never true  Transportation Needs: No Transportation Needs (09/25/2023)   PRAPARE - Administrator, Civil Service (Medical): No    Lack of Transportation (Non-Medical): No  Physical Activity: Not on file  Stress: Not on file  Social Connections: Socially Integrated (04/11/2023)   Social Connection and Isolation Panel    Frequency of Communication with Friends and Family:  More than three times a week    Frequency of Social Gatherings with Friends and Family: More than three times a week    Attends Religious Services: More than 4 times per year    Active Member of [employer] or Organizations: Yes    Attends Engineer, structural: More than 4 times per year    Marital Status: Married  Catering manager Violence: Not At Risk (09/25/2023)   Humiliation, Afraid, Rape, and Kick questionnaire    Fear of Current or Ex-Partner: No    Emotionally Abused: No    Physically Abused: No    Sexually Abused: No    Family History  Problem Relation Age of Onset   Heart disease Father    Kidney failure Father    Kidney disease Father    Diabetes Maternal Grandmother    Breast cancer Maternal Grandmother    Valvular heart disease Mother    Liver cancer Paternal Uncle    Liver cancer Paternal Grandmother    Prostate cancer Neg Hx      Current Outpatient Medications:    albuterol  (PROVENTIL ) (2.5 MG/3ML) 0.083% nebulizer solution, Take 3 mLs (2.5 mg total) by nebulization every 6 (six) hours as needed for wheezing or shortness of breath., Disp: 150 mL, Rfl: 0   albuterol  (VENTOLIN  HFA) 108 (90 Base) MCG/ACT inhaler, Inhale 2 puffs into the lungs every 6 (six) hours as needed for wheezing or shortness of breath., Disp: 6.7 g, Rfl: 5   apixaban  (ELIQUIS ) 5 MG TABS tablet, Take 1 tablet (5 mg total) by mouth 2 (two) times daily., Disp: 180 tablet, Rfl: 3   buPROPion  (WELLBUTRIN  SR) 150 MG 12 hr tablet, Take 1 tablet (150 mg total) by mouth 2 (two) times daily., Disp: 180 tablet, Rfl: 3   Continuous Glucose Sensor (FREESTYLE LIBRE 3 SENSOR) MISC, Use one every 2 weeks., Disp: 6 each, Rfl: 3   Cyanocobalamin  (B-12 PO), Take 1 tablet by mouth daily., Disp: , Rfl:    dapagliflozin  propanediol (FARXIGA ) 10 MG TABS tablet, Take 1 tablet (10 mg total) by mouth daily before breakfast., Disp: 90 tablet, Rfl: 3  fluticasone  (FLONASE ) 50 MCG/ACT nasal spray, Place 2 sprays into both  nostrils daily., Disp: 16 g, Rfl: 11   gabapentin  (NEURONTIN ) 300 MG capsule, Take 1 capsule (300 mg total) by mouth 2 (two) times daily., Disp: 180 capsule, Rfl: 3   HYDROcodone -acetaminophen  (NORCO/VICODIN) 5-325 MG tablet, Take 1 tablet by mouth every 6 (six) hours as needed for moderate pain (pain score 4-6)., Disp: 60 tablet, Rfl: 0   insulin  glargine-yfgn (SEMGLEE ) 100 UNIT/ML Pen, Inject 20 Units into the skin daily., Disp: 45 mL, Rfl: 3   Insulin  Pen Needle (TECHLITE PEN NEEDLES) 31G X 5 MM MISC, 4 (four) times daily., Disp: 400 each, Rfl: 3   Insulin  Pen Needle (UNIFINE PENTIPS) 31G X 5 MM MISC, Use 4 times daily as directed, Disp: 400 each, Rfl: 3   losartan  (COZAAR ) 25 MG tablet, Take 1 tablet (25 mg total) by mouth daily., Disp: 90 tablet, Rfl: 3   metolazone  (ZAROXOLYN ) 2.5 MG tablet, Take 1 tablet (2.5 mg total) by mouth as directed. By the heart failure clinic, Disp: 30 tablet, Rfl: 1   montelukast  (SINGULAIR ) 10 MG tablet, Take 1 tablet (10 mg total) by mouth at bedtime., Disp: 30 tablet, Rfl: 11   mycophenolate  (MYFORTIC ) 180 MG EC tablet, Take 2 tablets (360 mg total) by mouth every morning AND 1 tablet (180 mg total) every evening., Disp: 360 tablet, Rfl: 3   omeprazole  (PRILOSEC) 20 MG capsule, Take 1 capsule (20 mg total) by mouth daily., Disp: 30 capsule, Rfl: 11   potassium chloride  SA (KLOR-CON  M) 20 MEQ tablet, Take 1 tablet (20 mEq total) by mouth daily as needed., Disp: 90 tablet, Rfl: 3   predniSONE  (DELTASONE ) 5 MG tablet, Take 1 tablet (5 mg total) by mouth daily., Disp: 90 tablet, Rfl: 3   rosuvastatin  (CRESTOR ) 10 MG tablet, Take 1 tablet (10 mg total) by mouth daily., Disp: 90 tablet, Rfl: 3   silver  sulfADIAZINE  (SILVADENE ) 1 % cream, Apply 1 Application topically daily., Disp: 50 g, Rfl: 1   sulfamethoxazole -trimethoprim  (BACTRIM ) 400-80 MG tablet, Take 1 tablet by mouth 3 (three) times a week., Disp: 36 tablet, Rfl: 3   tacrolimus  (PROGRAF ) 1 MG capsule, Take 2  capsules (2 mg total) by mouth every morning AND 1 capsule (1 mg total) every evening., Disp: 90 capsule, Rfl: 11   tadalafil  (CIALIS ) 5 MG tablet, Take 1 tablet (5 mg total) by mouth daily as needed for erectile dysfunction., Disp: 90 tablet, Rfl: 0   tamsulosin  (FLOMAX ) 0.4 MG CAPS capsule, Take 1 capsule (0.4 mg total) by mouth daily., Disp: 30 capsule, Rfl: 11   Testosterone  20.25 MG/ACT (1.62%) GEL, Place 2 Pump onto the skin daily., Disp: 150 g, Rfl: 3   tirzepatide  (MOUNJARO ) 5 MG/0.5ML Pen, Inject 5 mg into the skin once a week., Disp: 2 mL, Rfl: 5   tiZANidine  (ZANAFLEX ) 2 MG tablet, Take 1 tablet (2 mg total) by mouth every 6 (six) hours as needed for muscle spasms., Disp: 30 tablet, Rfl: 1   torsemide  (DEMADEX ) 20 MG tablet, Take 4 tablets (80 mg total) by mouth 2 (two) times daily., Disp: 240 tablet, Rfl: 3   Zinc  50 MG TABS, Take 1 tablet (50 mg total) by mouth daily., Disp: 100 tablet, Rfl: 3   febuxostat  (ULORIC ) 40 MG tablet, Take 3 tablets (120 mg total) by mouth daily. (Patient not taking: Reported on 10/21/2023), Disp: 90 tablet, Rfl: 5   sodium chloride  (OCEAN) 0.65 % SOLN nasal spray, Place 2 sprays into  both nostrils every 8 (eight) hours for 10 days. (Patient not taking: Reported on 10/21/2023), Disp: , Rfl:   Physical exam:  Vitals:   10/21/23 1308  BP: 134/72  Pulse: 66  Resp: 17  Temp: (!) 96.6 F (35.9 C)  TempSrc: Tympanic  SpO2: 98%  Weight: 247 lb 6.4 oz (112.2 kg)  Height: 5' 10 (1.778 m)   Physical Exam Cardiovascular:     Rate and Rhythm: Normal rate and regular rhythm.     Heart sounds: Normal heart sounds.  Pulmonary:     Effort: Pulmonary effort is normal.     Breath sounds: Normal breath sounds.  Skin:    General: Skin is warm and dry.  Neurological:     Mental Status: He is alert and oriented to person, place, and time.         Latest Ref Rng & Units 10/10/2023    9:53 AM  CMP  Glucose 70 - 99 mg/dL 846   BUN 8 - 23 mg/dL 90   Creatinine  9.38 - 1.24 mg/dL 7.03   Sodium 864 - 854 mmol/L 140   Potassium 3.5 - 5.1 mmol/L 4.2   Chloride 98 - 111 mmol/L 102   CO2 22 - 32 mmol/L 29   Calcium  8.9 - 10.3 mg/dL 8.9       Latest Ref Rng & Units 10/10/2023   10:10 AM  CBC  WBC 4.0 - 10.5 K/uL 3.2   Hemoglobin 13.0 - 17.0 g/dL 8.4   Hematocrit 60.9 - 52.0 % 27.6   Platelets 150 - 400 K/uL 131     No images are attached to the encounter.  US  Venous Img Upper Uni Left Result Date: 10/06/2023 CLINICAL DATA:  Recent fall. History of shoulder fracture. Left arm swelling. EXAM: Left UPPER EXTREMITY VENOUS DOPPLER ULTRASOUND TECHNIQUE: Gray-scale sonography with graded compression, as well as color Doppler and duplex ultrasound were performed to evaluate the upper extremity deep venous system from the level of the subclavian vein and including the jugular, axillary, basilic, radial, ulnar and upper cephalic vein. Spectral Doppler was utilized to evaluate flow at rest and with distal augmentation maneuvers. COMPARISON:  None Available. FINDINGS: Contralateral Subclavian Vein: Respiratory phasicity is normal and symmetric with the symptomatic side. No evidence of thrombus. Normal compressibility. Internal Jugular Vein: No evidence of thrombus. Normal compressibility, respiratory phasicity and response to augmentation. Subclavian Vein: No evidence of thrombus. Normal compressibility, respiratory phasicity and response to augmentation. Axillary Vein: No evidence of thrombus. Normal compressibility, respiratory phasicity and response to augmentation. Cephalic Vein: No evidence of thrombus. Normal compressibility, respiratory phasicity and response to augmentation. Basilic Vein: No evidence of thrombus. Normal compressibility, respiratory phasicity and response to augmentation. Brachial Veins: No evidence of thrombus. Normal compressibility, respiratory phasicity and response to augmentation. Radial Veins: No evidence of thrombus. Normal compressibility,  respiratory phasicity and response to augmentation. Ulnar Veins: No evidence of thrombus. Normal compressibility, respiratory phasicity and response to augmentation. Venous Reflux:  None visualized. Other Findings: Patent AV fistula noted between the brachial artery and the cephalic vein for dialysis. IMPRESSION: No evidence of DVT within the left upper extremity. Electronically Signed   By: MYRTIS Stammer M.D.   On: 10/06/2023 10:09   DG Elbow 2 Views Left Result Date: 10/06/2023 CLINICAL DATA:  Left shoulder fracture.  Evaluate left elbow. EXAM: LEFT ELBOW - 2 VIEW COMPARISON:  10/01/2023 FINDINGS: Suboptimal positioning related to patient tolerance. Vascular calcifications and clips are identified within the antecubital region. Exam  is indeterminate for the evaluation of joint effusion due to suboptimal positioning of the lateral projection radiograph. Within these limitations, there are no signs to suggest acute fracture or dislocation. IMPRESSION: 1. Suboptimal positioning related to patient tolerance. No acute findings. Consider CT scan for further characterization if there is a high clinical index of suspicion for fracture. 2. Exam is indeterminate for the evaluation of joint effusion due to suboptimal positioning of the lateral projection radiograph. Electronically Signed   By: Waddell Calk M.D.   On: 10/06/2023 08:28   DG Elbow Complete Left Result Date: 10/01/2023 CLINICAL DATA:  Elbow pain after fall EXAM: LEFT ELBOW - COMPLETE 3+ VIEW COMPARISON:  None Available. FINDINGS: Suboptimal positioning related to patient tolerance. Vascular calcifications with some clips and a possible graft or av fistula in the antecubital region, correlate with patient history. Exam indeterminate for elbow effusion due to lack of a true lateral projection. Chronic appearing well corticated erosion along the lateral capitellar margin. Irregularity medially along the distal metaphysis on the oblique lateral projection  attempt probably due to bony superimposition of the epicondyle. I am generally skeptical of an acute fracture. Substantially reduced sensitivity due to positioning difficulties, consider CT scan. IMPRESSION: 1. Suboptimal positioning related to patient tolerance. I am generally skeptical of an acute fracture, but consider CT scan for further characterization if there is a high clinical index of suspicion. 2. Indeterminate for elbow effusion. 3. Chronic appearing well corticated erosion along the lateral capitellar margin. 4. Vascular calcifications with some clips and a possible graft or AV fistula in the antecubital region, correlate with patient history. Electronically Signed   By: Ryan Salvage M.D.   On: 10/01/2023 09:08   EEG adult Result Date: 09/29/2023 Shelton Arlin KIDD, MD     09/29/2023  5:05 PM Patient Name: Vartan Kerins. MRN: [id number] Epilepsy Attending: Arlin KIDD Shelton Referring Physician/Provider: Lanetta Lingo, MD Date: 09/29/2023 Duration: 30.18 mins Patient history: 63yo M with episode of unresponsiveness. EEG to evaluate for seizure Level of alertness: Awake, asleep AEDs during EEG study: GBP Technical aspects: This EEG study was done with scalp electrodes positioned according to the 10-20 International system of electrode placement. Electrical activity was reviewed with band pass filter of 1-70Hz , sensitivity of 7 uV/mm, display speed of 77mm/sec with a 60Hz  notched filter applied as appropriate. EEG data were recorded continuously and digitally stored.  Video monitoring was available and reviewed as appropriate. Description: The posterior dominant rhythm consists of 8 Hz activity of moderate voltage (25-35 uV) seen predominantly in posterior head regions, symmetric and reactive to eye opening and eye closing. Sleep was characterized by vertex waves, sleep spindles (12 to 14 Hz), maximal frontocentral region. Hyperventilation and photic stimulation were not performed.    IMPRESSION: This study is within normal limits. No seizures or epileptiform discharges were seen throughout the recording. A normal interictal EEG does not exclude the diagnosis of epilepsy. Arlin KIDD Shelton   ECHOCARDIOGRAM COMPLETE Result Date: 09/27/2023    ECHOCARDIOGRAM REPORT   Patient Name:   Timoteo Carreiro. Date of Exam: 09/26/2023 Medical Rec #:  [id number]               Height:       70.0 in Accession #:    [id number]              Weight:       244.7 lb Date of Birth:  Sep 26, 1960  BSA:          2.274 m Patient Age:    63 years                BP:           130/70 mmHg Patient Gender: M                       HR:           62 bpm. Exam Location:  [facility] Procedure: 2D Echo, Cardiac Doppler and Color Doppler (Both Spectral and Color            Flow Doppler were utilized during procedure). Indications:     CHF I50.21  History:         Patient has prior history of Echocardiogram examinations, most                  recent 03/25/2023.  Sonographer:     Thedora Louder RDCS, FASE Referring Phys:  [id number] ATLEE ABERNETHY Diagnosing Phys: Cara JONETTA Lovelace MD  Sonographer Comments: Technically difficult study due to poor echo windows, no apical window, suboptimal parasternal window and patient is obese. Image acquisition challenging due to respiratory motion. Unable to obtain any apical images due to patient having a left arm sling. IMPRESSIONS  1. Left ventricular ejection fraction, by estimation, is 40 to 45%. The left ventricle has mildly decreased function. The left ventricle demonstrates global hypokinesis. The left ventricular internal cavity size was mildly dilated. Left ventricular diastolic function could not be evaluated.  2. Right ventricular systolic function was not well visualized. The right ventricular size is not well visualized.  3. Left atrial size was moderately dilated.  4. Right atrial size was mild to moderately dilated.  5. The mitral valve was not well visualized. No evidence of  mitral valve regurgitation.  6. The aortic valve was not well visualized. Aortic valve regurgitation is not visualized. FINDINGS  Left Ventricle: Left ventricular ejection fraction, by estimation, is 40 to 45%. The left ventricle has mildly decreased function. The left ventricle demonstrates global hypokinesis. Strain was performed and the global longitudinal strain is indeterminate. The left ventricular internal cavity size was mildly dilated. There is borderline left ventricular hypertrophy. Left ventricular diastolic function could not be evaluated. Right Ventricle: The right ventricular size is not well visualized. Right vetricular wall thickness was not well visualized. Right ventricular systolic function was not well visualized. Left Atrium: Left atrial size was moderately dilated. Right Atrium: Right atrial size was mild to moderately dilated. Pericardium: The pericardium was not well visualized. Mitral Valve: The mitral valve was not well visualized. No evidence of mitral valve regurgitation. Tricuspid Valve: The tricuspid valve is not well visualized. Tricuspid valve regurgitation is not demonstrated. Aortic Valve: The aortic valve was not well visualized. Aortic valve regurgitation is not visualized. Pulmonic Valve: The pulmonic valve was not well visualized. Pulmonic valve regurgitation is not visualized. Aorta: The ascending aorta was not well visualized. IAS/Shunts: No atrial level shunt detected by color flow Doppler. Additional Comments: 3D was performed not requiring image post processing on an independent workstation and was indeterminate.  LEFT VENTRICLE PLAX 2D LVIDd:         5.40 cm LVIDs:         4.30 cm LV PW:         1.10 cm LV IVS:        1.00 cm LVOT diam:     1.80 cm  LVOT Area:     2.54 cm  LEFT ATRIUM         Index LA diam:    4.80 cm 2.11 cm/m                        PULMONIC VALVE AORTA                 PV Vmax:        1.86 m/s Ao Root diam: 3.50 cm PV Peak grad:   13.8 mmHg                        RVOT Peak grad: 4 mmHg  TRICUSPID VALVE TR Peak grad:   39.4 mmHg TR Vmax:        314.00 cm/s  SHUNTS Systemic Diam: 1.80 cm Cara JONETTA Lovelace MD Electronically signed by Cara JONETTA Lovelace MD Signature Date/Time: 09/27/2023/12:51:40 PM    Final    DG Chest Port 1 View Result Date: 09/26/2023 CLINICAL DATA:  Shortness of breath EXAM: PORTABLE CHEST 1 VIEW COMPARISON:  04/08/2023 FINDINGS: Sternotomy. Cardiomegaly with vascular congestion and mild diffuse interstitial opacities likely edema. No pleural effusion or focal airspace disease. IMPRESSION: Cardiomegaly with vascular congestion and mild diffuse interstitial opacities likely due to edema. Electronically Signed   By: Luke Bun M.D.   On: 09/26/2023 15:25   MR BRAIN WO CONTRAST Result Date: 09/26/2023 EXAM DESCRIPTION: MR BRAIN WO CONTRAST CLINICAL HISTORY: Mental status change, unknown cause COMPARISON: None available TECHNIQUE: Non contrast MRI of the brain is performed according to our usual protocol including multiplanar multi sequence technique. FINDINGS: No acute intracranial hemorrhage, mass, edema, or hydrocephalus. Mild cortical atrophy. No encephalomalacia. Mild to moderate white matter disease suggesting chronic small vessel ischemic change. The vascular flow voids are unremarkable. Mild sinus disease with mild air-fluid levels posteriorly which can be seen with acute maxillary sinusitis. IMPRESSION: Age-related change without acute intracranial pathology. Mild nonspecific sinus disease with mild air-fluid levels which can be seen in acute sinusitis. Electronically signed by: Reyes Frees MD 09/26/2023 12:38 PM EDT RP Workstation: [id number]   CT HEAD CODE STROKE WO CONTRAST` Result Date: 09/26/2023 CLINICAL DATA:  Code stroke. 62 year old male. Recent fall with epistaxis, on Eliquis . EXAM: CT HEAD WITHOUT CONTRAST TECHNIQUE: Contiguous axial images were obtained from the base of the skull through the vertex without intravenous  contrast. RADIATION DOSE REDUCTION: This exam was performed according to the departmental dose-optimization program which includes automated exposure control, adjustment of the mA and/or kV according to patient size and/or use of iterative reconstruction technique. COMPARISON:  Head face and cervical spine CT yesterday. FINDINGS: Brain: Cerebral volume is normal for age. No midline shift, ventriculomegaly, mass effect, evidence of mass lesion, intracranial hemorrhage or evidence of cortically based acute infarction. Stable scattered mild to moderate patchy white matter hypodensity. Vascular: Calcified atherosclerosis at the skull base. No suspicious intracranial vascular hyperdensity. Skull: Stable.  No acute osseous abnormality identified. Sinuses/Orbits: Stable maxillary sinus fluid levels, bubbly opacity in the visible nasal cavity and nasopharynx. Tympanic cavities, mastoids remain well aerated. Chronic left mastoidectomy. Other: Extensive scalp vessel calcified atherosclerosis. Stable orbit and scalp soft tissues. ASPECTS [facility] Stroke Program Early CT Score) Total score (0-10 with 10 being normal): 10 IMPRESSION: 1. Stable non contrast CT appearance of the brain from yesterday. ASPECTS 10. Patchy white matter disease. 2. The above were communicated to Dr. Matthews at 8:58 am on 09/26/2023 by text page  via the [employer] system. 3. Stable other face, paranasal sinus, scalp soft tissue findings. Electronically Signed   By: VEAR Hurst M.D.   On: 09/26/2023 08:58   CT Maxillofacial Wo Contrast Result Date: 09/25/2023 CLINICAL DATA:  63 year old male status post fall striking face with subsequent epistaxis. On Eliquis . EXAM: CT MAXILLOFACIAL WITHOUT CONTRAST TECHNIQUE: Multidetector CT imaging of the maxillofacial structures was performed. Multiplanar CT image reconstructions were also generated. RADIATION DOSE REDUCTION: This exam was performed according to the departmental dose-optimization program which  includes automated exposure control, adjustment of the mA and/or kV according to patient size and/or use of iterative reconstruction technique. COMPARISON:  Head CT today. FINDINGS: Mild motion artifact. Osseous: Mandible motion artifact. Normally aligned bilateral TMJ. No mandible fracture is evident. Bilateral maxilla, nasal bones, pterygoid bones, zygoma appears symmetric and intact. Visible skull base, cervical vertebrae appear intact and aligned. Orbits: No orbital wall fracture. Postoperative changes to both globes. Otherwise negative orbits soft tissues. Sinuses: Mild leftward nasal septal deviation is stable from 2024 brain MRI. Mild retained secretions in the anterior inferior nasal cavity. Opacified bilateral posterior nasal meatus. No nasal septal swelling identified. Small volume layering hemorrhage in the maxillary sinuses. Trace fluid in the right sphenoid sinus. Left greater than right ethmoid opacification is more chronic. Frontal sinuses are clear. Tympanic cavities and mastoids are clear. Previous left mastoidectomy. Soft tissues: Larynx and pharynx motion artifact. Oropharynx appears pneumatized. There is opacification of the nasopharynx contiguous with the posterior nasal cavity. Negative visible noncontrast parapharyngeal spaces, retropharyngeal space, sublingual space, submandibular spaces, masticator and parotid spaces (atrophy parotid glands). Widespread calcified atherosclerosis. Limited intracranial: Stable to that reported separately today. IMPRESSION: 1. Mild motion artifact. No acute facial fracture identified. 2. Opacified posterior nasal cavity, nasopharynx. Small volume hemorrhage layering in the maxillary and right sphenoid sinuses. No acute nasal fracture identified. Electronically Signed   By: VEAR Hurst M.D.   On: 09/25/2023 07:22   CT Head Wo Contrast Result Date: 09/25/2023 CLINICAL DATA:  63 year old male status post fall striking face with subsequent epistaxis. On Eliquis .  EXAM: CT HEAD WITHOUT CONTRAST TECHNIQUE: Contiguous axial images were obtained from the base of the skull through the vertex without intravenous contrast. RADIATION DOSE REDUCTION: This exam was performed according to the departmental dose-optimization program which includes automated exposure control, adjustment of the mA and/or kV according to patient size and/or use of iterative reconstruction technique. COMPARISON:  Face CT today reported separately.  Head CT 06/20/2023. FINDINGS: Brain: Stable cerebral volume, within normal limits for age. No midline shift, ventriculomegaly, mass effect, evidence of mass lesion, intracranial hemorrhage or evidence of cortically based acute infarction. Mild for age scattered patchy white matter hypodensity is stable. Vascular: Calcified atherosclerosis at the skull base. No suspicious intracranial vascular hyperdensity. Skull: No acute fracture identified.  Face is reported separately. Sinuses/Orbits: Layering hyperdense fluid compatible with blood in the maxillary sinuses. Fluid in the visible nasopharynx. Scattered ethmoid mucosal thickening and opacification unchanged from March. Tympanic cavities and mastoids remain well aerated, chronic left mastoidectomy. Other: Calcified scalp vessel atherosclerosis. No acute orbit or scalp soft tissue finding identified. IMPRESSION: 1. Small volume hemorrhage layering in the maxillary sinuses, and fluid in the visible pharynx. See Face CT reported separately. 2. No acute intracranial abnormality. Stable non contrast CT appearance of chronic white matter disease. Electronically Signed   By: VEAR Hurst M.D.   On: 09/25/2023 07:19   Assessment and plan- Patient is a 63 y.o. male  Anemia of chronic  kidney disease- Hemoglobin has been between 12-13 since 2024 and had not required EPO after transitioning to [place]. However, More recently, Hemglobin has dipped to 8.4. Ferritin 127, Iron  sat 10%. Plan to receive venofer  x 4. We will also  likely need to restart EPO if hemoglobin < 11 with optimized iron  stores. Goal iron  sat > 20%. Plan to check MM panel at next visit.  CKD- GFR 20s. Fistula in left arm but has not yet required dialysis.  Iron  Deficiency- suspect secondary to blood loss/hematomas with recent injuries. Monitor for now.   Disposition:  6 weeks- lab (H&H, hold tube) Venofer  x 4 (first today then weekly) 8 weeks- lab (cbc, ferritin, iron  studies, MM panel), Dr Melanee, +/- venofer - la   Visit Diagnosis 1. Anemia due to stage 4 chronic kidney disease (HCC)   2. Iron  deficiency anemia, unspecified iron  deficiency anemia type     Tinnie Dawn, DNP, AGNP-C, AOCNP [facility] [phone] (clinic) 10/21/2023

## 2023-10-22 DIAGNOSIS — D696 Thrombocytopenia, unspecified: Secondary | ICD-10-CM | POA: Diagnosis not present

## 2023-10-22 DIAGNOSIS — E119 Type 2 diabetes mellitus without complications: Secondary | ICD-10-CM | POA: Diagnosis not present

## 2023-10-22 DIAGNOSIS — S4292XD Fracture of left shoulder girdle, part unspecified, subsequent encounter for fracture with routine healing: Secondary | ICD-10-CM | POA: Diagnosis not present

## 2023-10-22 DIAGNOSIS — I110 Hypertensive heart disease with heart failure: Secondary | ICD-10-CM | POA: Diagnosis not present

## 2023-10-22 DIAGNOSIS — E662 Morbid (severe) obesity with alveolar hypoventilation: Secondary | ICD-10-CM | POA: Diagnosis not present

## 2023-10-22 DIAGNOSIS — I272 Pulmonary hypertension, unspecified: Secondary | ICD-10-CM | POA: Diagnosis not present

## 2023-10-22 DIAGNOSIS — D61818 Other pancytopenia: Secondary | ICD-10-CM | POA: Diagnosis not present

## 2023-10-22 DIAGNOSIS — I5042 Chronic combined systolic (congestive) and diastolic (congestive) heart failure: Secondary | ICD-10-CM | POA: Diagnosis not present

## 2023-10-22 DIAGNOSIS — I4821 Permanent atrial fibrillation: Secondary | ICD-10-CM | POA: Diagnosis not present

## 2023-10-22 DIAGNOSIS — I700 Atherosclerosis of aorta: Secondary | ICD-10-CM | POA: Diagnosis not present

## 2023-10-23 ENCOUNTER — Telehealth (HOSPITAL_COMMUNITY): Payer: Self-pay

## 2023-10-23 DIAGNOSIS — D696 Thrombocytopenia, unspecified: Secondary | ICD-10-CM | POA: Diagnosis not present

## 2023-10-23 DIAGNOSIS — I4821 Permanent atrial fibrillation: Secondary | ICD-10-CM | POA: Diagnosis not present

## 2023-10-23 DIAGNOSIS — E662 Morbid (severe) obesity with alveolar hypoventilation: Secondary | ICD-10-CM | POA: Diagnosis not present

## 2023-10-23 DIAGNOSIS — G5132 Clonic hemifacial spasm, left: Secondary | ICD-10-CM | POA: Diagnosis not present

## 2023-10-23 DIAGNOSIS — I5042 Chronic combined systolic (congestive) and diastolic (congestive) heart failure: Secondary | ICD-10-CM | POA: Diagnosis not present

## 2023-10-23 DIAGNOSIS — S4292XD Fracture of left shoulder girdle, part unspecified, subsequent encounter for fracture with routine healing: Secondary | ICD-10-CM | POA: Diagnosis not present

## 2023-10-23 DIAGNOSIS — I700 Atherosclerosis of aorta: Secondary | ICD-10-CM | POA: Diagnosis not present

## 2023-10-23 DIAGNOSIS — E119 Type 2 diabetes mellitus without complications: Secondary | ICD-10-CM | POA: Diagnosis not present

## 2023-10-23 DIAGNOSIS — D61818 Other pancytopenia: Secondary | ICD-10-CM | POA: Diagnosis not present

## 2023-10-23 DIAGNOSIS — F32A Depression, unspecified: Secondary | ICD-10-CM

## 2023-10-23 DIAGNOSIS — I110 Hypertensive heart disease with heart failure: Secondary | ICD-10-CM | POA: Diagnosis not present

## 2023-10-23 DIAGNOSIS — I872 Venous insufficiency (chronic) (peripheral): Secondary | ICD-10-CM

## 2023-10-23 DIAGNOSIS — I272 Pulmonary hypertension, unspecified: Secondary | ICD-10-CM | POA: Diagnosis not present

## 2023-10-23 NOTE — Telephone Encounter (Signed)
 Called to confirm/remind patient of their appointment at the [facility] on 10/24/23.   Appointment:   [] Confirmed  [x] Left mess   [] No answer/No voice mail  [] VM Full/unable to leave message  [] Phone not in service  And to bring in all medications and/or complete list.

## 2023-10-24 ENCOUNTER — Encounter (HOSPITAL_COMMUNITY): Payer: Self-pay

## 2023-10-24 ENCOUNTER — Other Ambulatory Visit: Payer: Self-pay

## 2023-10-24 ENCOUNTER — Ambulatory Visit (HOSPITAL_COMMUNITY)
Admission: RE | Admit: 2023-10-24 | Discharge: 2023-10-24 | Disposition: A | Discharge status: Home / Self Care | Source: Ambulatory Visit | Attending: Family Medicine | Admitting: Family Medicine

## 2023-10-24 VITALS — BP 112/68 | HR 72 | Wt 246.2 lb

## 2023-10-24 DIAGNOSIS — Z7985 Long-term (current) use of injectable non-insulin antidiabetic drugs: Secondary | ICD-10-CM | POA: Insufficient documentation

## 2023-10-24 DIAGNOSIS — I10 Essential (primary) hypertension: Secondary | ICD-10-CM

## 2023-10-24 DIAGNOSIS — I130 Hypertensive heart and chronic kidney disease with heart failure and stage 1 through stage 4 chronic kidney disease, or unspecified chronic kidney disease: Secondary | ICD-10-CM | POA: Diagnosis not present

## 2023-10-24 DIAGNOSIS — Z79899 Other long term (current) drug therapy: Secondary | ICD-10-CM | POA: Insufficient documentation

## 2023-10-24 DIAGNOSIS — Z7901 Long term (current) use of anticoagulants: Secondary | ICD-10-CM | POA: Insufficient documentation

## 2023-10-24 DIAGNOSIS — E1121 Type 2 diabetes mellitus with diabetic nephropathy: Secondary | ICD-10-CM | POA: Diagnosis not present

## 2023-10-24 DIAGNOSIS — Z951 Presence of aortocoronary bypass graft: Secondary | ICD-10-CM | POA: Diagnosis not present

## 2023-10-24 DIAGNOSIS — I255 Ischemic cardiomyopathy: Secondary | ICD-10-CM | POA: Diagnosis not present

## 2023-10-24 DIAGNOSIS — I252 Old myocardial infarction: Secondary | ICD-10-CM | POA: Diagnosis not present

## 2023-10-24 DIAGNOSIS — I5022 Chronic systolic (congestive) heart failure: Secondary | ICD-10-CM | POA: Insufficient documentation

## 2023-10-24 DIAGNOSIS — Z9181 History of falling: Secondary | ICD-10-CM | POA: Insufficient documentation

## 2023-10-24 DIAGNOSIS — I5032 Chronic diastolic (congestive) heart failure: Secondary | ICD-10-CM

## 2023-10-24 DIAGNOSIS — E1122 Type 2 diabetes mellitus with diabetic chronic kidney disease: Secondary | ICD-10-CM | POA: Insufficient documentation

## 2023-10-24 DIAGNOSIS — Z794 Long term (current) use of insulin: Secondary | ICD-10-CM | POA: Insufficient documentation

## 2023-10-24 DIAGNOSIS — Z7984 Long term (current) use of oral hypoglycemic drugs: Secondary | ICD-10-CM | POA: Insufficient documentation

## 2023-10-24 DIAGNOSIS — I50810 Right heart failure, unspecified: Secondary | ICD-10-CM | POA: Diagnosis not present

## 2023-10-24 DIAGNOSIS — Z940 Kidney transplant status: Secondary | ICD-10-CM | POA: Diagnosis not present

## 2023-10-24 DIAGNOSIS — Z79624 Long term (current) use of inhibitors of nucleotide synthesis: Secondary | ICD-10-CM | POA: Diagnosis not present

## 2023-10-24 DIAGNOSIS — I482 Chronic atrial fibrillation, unspecified: Secondary | ICD-10-CM | POA: Diagnosis not present

## 2023-10-24 DIAGNOSIS — D8984 IgG4-related disease: Secondary | ICD-10-CM | POA: Diagnosis not present

## 2023-10-24 DIAGNOSIS — N184 Chronic kidney disease, stage 4 (severe): Secondary | ICD-10-CM | POA: Diagnosis not present

## 2023-10-24 DIAGNOSIS — Z87891 Personal history of nicotine dependence: Secondary | ICD-10-CM | POA: Insufficient documentation

## 2023-10-24 DIAGNOSIS — I2510 Atherosclerotic heart disease of native coronary artery without angina pectoris: Secondary | ICD-10-CM | POA: Insufficient documentation

## 2023-10-24 DIAGNOSIS — Z79621 Long term (current) use of calcineurin inhibitor: Secondary | ICD-10-CM | POA: Diagnosis not present

## 2023-10-24 DIAGNOSIS — R001 Bradycardia, unspecified: Secondary | ICD-10-CM | POA: Insufficient documentation

## 2023-10-24 DIAGNOSIS — G4733 Obstructive sleep apnea (adult) (pediatric): Secondary | ICD-10-CM | POA: Insufficient documentation

## 2023-10-24 DIAGNOSIS — I272 Pulmonary hypertension, unspecified: Secondary | ICD-10-CM | POA: Diagnosis not present

## 2023-10-24 DIAGNOSIS — D631 Anemia in chronic kidney disease: Secondary | ICD-10-CM | POA: Insufficient documentation

## 2023-10-24 MED ORDER — TORSEMIDE 100 MG PO TABS
100.0000 mg | ORAL_TABLET | Freq: Two times a day (BID) | ORAL | 1 refills | Status: DC
  Filled 2023-10-24: qty 90, 45d supply, fill #0

## 2023-10-24 NOTE — Patient Instructions (Addendum)
 Good to see you today!  INCREASE Torsemide  to 100 mg ( 1 tablet) Twice daily  TAKe Metolazine 2.5 mg today along with potassium 20 meq  today  Lab work in 10-14 days at [facility]  Your physician recommends that you schedule a follow-up appointment as schedule  If you have any questions or concerns before your next appointment please send us  a message through [place] or call our office at [phone].    TO LEAVE A MESSAGE FOR THE NURSE SELECT OPTION 2, PLEASE LEAVE A MESSAGE INCLUDING: YOUR NAME DATE OF BIRTH CALL BACK NUMBER REASON FOR CALL**this is important as we prioritize the call backs  YOU WILL RECEIVE A CALL BACK THE SAME DAY AS LONG AS YOU CALL BEFORE 4:00 PM   At the [facility], you and your health needs are our priority. As part of our continuing mission to provide you with exceptional heart care, we have created designated Provider Care Teams. These Care Teams include your primary Cardiologist (physician) and Advanced Practice Providers (APPs- Physician Assistants and Nurse Practitioners) who all work together to provide you with the care you need, when you need it.   You may see any of the following providers on your designated Care Team at your next follow up: Dr Toribio Fuel Dr Ezra Shuck Dr. Ria Commander Dr. Morene Brownie Amy Lenetta, NP Caffie Shed, [place] [facility] [place], [place] Beckey Coe, NP [place] Lee, NP Ellouise Class, NP Tinnie Redman, PharmD Jaun Bash, PharmD   Please be sure to bring in all your medications bottles to every appointment.    Thank you for choosing [facility]

## 2023-10-24 NOTE — Progress Notes (Signed)
 ReDS Vest / Clip - 10/24/23 0900       ReDS Vest / Clip   Station Marker D    Ruler Value 34.5    ReDS Value Range Moderate volume overload    ReDS Actual Value 37

## 2023-10-27 NOTE — Telephone Encounter (Signed)
 Patient would like to change his 3pm infusion appointment tomorrow. Please call.

## 2023-10-28 ENCOUNTER — Inpatient Hospital Stay

## 2023-10-28 DIAGNOSIS — I4821 Permanent atrial fibrillation: Secondary | ICD-10-CM | POA: Diagnosis not present

## 2023-10-28 DIAGNOSIS — I272 Pulmonary hypertension, unspecified: Secondary | ICD-10-CM | POA: Diagnosis not present

## 2023-10-28 DIAGNOSIS — E662 Morbid (severe) obesity with alveolar hypoventilation: Secondary | ICD-10-CM | POA: Diagnosis not present

## 2023-10-28 DIAGNOSIS — D696 Thrombocytopenia, unspecified: Secondary | ICD-10-CM | POA: Diagnosis not present

## 2023-10-28 DIAGNOSIS — E119 Type 2 diabetes mellitus without complications: Secondary | ICD-10-CM | POA: Diagnosis not present

## 2023-10-28 DIAGNOSIS — S4292XD Fracture of left shoulder girdle, part unspecified, subsequent encounter for fracture with routine healing: Secondary | ICD-10-CM | POA: Diagnosis not present

## 2023-10-28 DIAGNOSIS — I5042 Chronic combined systolic (congestive) and diastolic (congestive) heart failure: Secondary | ICD-10-CM | POA: Diagnosis not present

## 2023-10-28 DIAGNOSIS — I110 Hypertensive heart disease with heart failure: Secondary | ICD-10-CM | POA: Diagnosis not present

## 2023-10-28 DIAGNOSIS — D61818 Other pancytopenia: Secondary | ICD-10-CM | POA: Diagnosis not present

## 2023-10-28 DIAGNOSIS — I700 Atherosclerosis of aorta: Secondary | ICD-10-CM | POA: Diagnosis not present

## 2023-10-29 ENCOUNTER — Other Ambulatory Visit: Payer: Self-pay

## 2023-10-29 ENCOUNTER — Other Ambulatory Visit: Payer: Self-pay | Admitting: Cardiology

## 2023-10-29 ENCOUNTER — Other Ambulatory Visit (HOSPITAL_COMMUNITY): Payer: Self-pay | Admitting: Cardiology

## 2023-10-29 DIAGNOSIS — M25512 Pain in left shoulder: Secondary | ICD-10-CM | POA: Diagnosis not present

## 2023-10-29 DIAGNOSIS — S42232D 3-part fracture of surgical neck of left humerus, subsequent encounter for fracture with routine healing: Secondary | ICD-10-CM | POA: Diagnosis not present

## 2023-10-29 DIAGNOSIS — R531 Weakness: Secondary | ICD-10-CM | POA: Diagnosis not present

## 2023-10-29 DIAGNOSIS — M25612 Stiffness of left shoulder, not elsewhere classified: Secondary | ICD-10-CM | POA: Diagnosis not present

## 2023-10-30 ENCOUNTER — Other Ambulatory Visit: Payer: Self-pay

## 2023-10-30 ENCOUNTER — Ambulatory Visit: Payer: Self-pay | Admitting: Surgery

## 2023-10-30 ENCOUNTER — Inpatient Hospital Stay: Attending: Oncology

## 2023-10-30 VITALS — BP 136/73 | HR 58 | Temp 97.2°F | Resp 18

## 2023-10-30 DIAGNOSIS — N184 Chronic kidney disease, stage 4 (severe): Secondary | ICD-10-CM | POA: Insufficient documentation

## 2023-10-30 DIAGNOSIS — D509 Iron deficiency anemia, unspecified: Secondary | ICD-10-CM | POA: Diagnosis not present

## 2023-10-30 DIAGNOSIS — I700 Atherosclerosis of aorta: Secondary | ICD-10-CM | POA: Diagnosis not present

## 2023-10-30 DIAGNOSIS — Z87891 Personal history of nicotine dependence: Secondary | ICD-10-CM | POA: Insufficient documentation

## 2023-10-30 DIAGNOSIS — D631 Anemia in chronic kidney disease: Secondary | ICD-10-CM | POA: Diagnosis not present

## 2023-10-30 DIAGNOSIS — S4292XD Fracture of left shoulder girdle, part unspecified, subsequent encounter for fracture with routine healing: Secondary | ICD-10-CM | POA: Diagnosis not present

## 2023-10-30 DIAGNOSIS — D508 Other iron deficiency anemias: Secondary | ICD-10-CM

## 2023-10-30 DIAGNOSIS — I272 Pulmonary hypertension, unspecified: Secondary | ICD-10-CM | POA: Diagnosis not present

## 2023-10-30 DIAGNOSIS — Z79899 Other long term (current) drug therapy: Secondary | ICD-10-CM | POA: Insufficient documentation

## 2023-10-30 DIAGNOSIS — Z803 Family history of malignant neoplasm of breast: Secondary | ICD-10-CM | POA: Diagnosis not present

## 2023-10-30 DIAGNOSIS — Z800 Family history of malignant neoplasm of digestive organs: Secondary | ICD-10-CM | POA: Diagnosis not present

## 2023-10-30 DIAGNOSIS — Z940 Kidney transplant status: Secondary | ICD-10-CM | POA: Insufficient documentation

## 2023-10-30 DIAGNOSIS — E119 Type 2 diabetes mellitus without complications: Secondary | ICD-10-CM | POA: Diagnosis not present

## 2023-10-30 DIAGNOSIS — D696 Thrombocytopenia, unspecified: Secondary | ICD-10-CM | POA: Diagnosis not present

## 2023-10-30 DIAGNOSIS — I110 Hypertensive heart disease with heart failure: Secondary | ICD-10-CM | POA: Diagnosis not present

## 2023-10-30 DIAGNOSIS — I5042 Chronic combined systolic (congestive) and diastolic (congestive) heart failure: Secondary | ICD-10-CM | POA: Diagnosis not present

## 2023-10-30 DIAGNOSIS — E662 Morbid (severe) obesity with alveolar hypoventilation: Secondary | ICD-10-CM | POA: Diagnosis not present

## 2023-10-30 DIAGNOSIS — I4821 Permanent atrial fibrillation: Secondary | ICD-10-CM | POA: Diagnosis not present

## 2023-10-30 DIAGNOSIS — D61818 Other pancytopenia: Secondary | ICD-10-CM | POA: Diagnosis not present

## 2023-10-30 MED ORDER — MOUNJARO 5 MG/0.5ML ~~LOC~~ SOAJ
5.0000 mg | SUBCUTANEOUS | 5 refills | Status: DC
  Filled 2023-10-30: qty 2, 28d supply, fill #0
  Filled 2023-11-29: qty 2, 28d supply, fill #1

## 2023-10-30 MED ORDER — IRON SUCROSE 20 MG/ML IV SOLN
200.0000 mg | INTRAVENOUS | Status: DC
  Administered 2023-10-30: 200 mg via INTRAVENOUS
  Filled 2023-10-30: qty 10

## 2023-10-30 MED ORDER — APIXABAN 5 MG PO TABS
5.0000 mg | ORAL_TABLET | Freq: Two times a day (BID) | ORAL | 3 refills | Status: DC
  Filled 2023-10-30: qty 180, 90d supply, fill #0

## 2023-10-30 MED ORDER — ROSUVASTATIN CALCIUM 10 MG PO TABS
10.0000 mg | ORAL_TABLET | Freq: Every day | ORAL | 3 refills | Status: DC
  Filled 2023-10-30: qty 90, 90d supply, fill #0
  Filled 2023-12-31 – 2024-03-11 (×2): qty 90, 90d supply, fill #1

## 2023-10-30 NOTE — H&P (Signed)
 PROVIDER: [name], MD  Patient Care Team: Carl Charlie FERNS, MD as PCP - General (Family Medicine) Carl Ezra RAMAN, MD (Cardiovascular Disease) Carl Toribio SQUIBB, MD (Gastroenterology) SCHULTZE, ELSPETH JUDAH, MD as Consulting Provider (Colon and Rectal Surgery) Tye Millet, DO as Consulting Provider (General Surgery) Auston Elsie Hero as Physician Assistant (Transplant Surgery)  [facility] MRN: [id number] DOB: 1961-02-22 DATE OF ENCOUNTER: 07/14/2023  Interval History:   The patient returns to the office after undergoing  -Anal condyloma fulguration excisional biopsy. Anal examination under anesthesia. 05/31/2022 Dr. Tye -anorectal examination under anesthesia, anal biopsies, laser ablation of anal condylomata 01/08/2023 Dr SCHULTZE  Pathology: Condyloma with low-grade dysplasia  Patient returns for 16-month follow-up after ablation of condyloma and healing. He been feeling okay but noticed some bleeding the past week or so and was concerned. Moving his bowels every day. No fevers or chills  PRIOR NOTE 02/24/2023 Patient returns for second postop visit. Unfortunately week after the last visit he felt weak and was admitted to rule out a stroke. Workup was negative for that. He did have a bump in his creatinine with his kidney transplant though. Followed up with cardiology and restarted on some of his diuretics with adjustment of his medications. Last visit last week increasing his diuretic some more by Dr. Mady with cardiology.  He came in today by himself. He is feeling better. Sounds like he got bad diarrhea with the antibiotics given but now that he stopped that it is better. Usually having 1 or 2 bowel movements. Some irritation occasionally hard. Not on any laxatives or stool softeners at this time. Intermittent on the fiber. No fevers or chills. Been using powders and dry pads around the anus. Some mild anal leakage but it is tapering off. No sharp pains  PRIOR NOTE  01/27/2023 Patient returns feeling relatively well. Having some fecal urgency with eating. No incontinence. Not really on much of a fiber supplement. Not trying Imodium. He has been placed on a short course of antibiotics given concerns about his kidney he tells me today. No fevers or chills. Pain is gone down. Denies much bleeding or drainage. Has been using topical lidocaine  for to help.     Labs, Imaging and Diagnostic Testing:  Located in 'Care Everywhere' section of Epic EMR chart  PRIOR [facility] CLINIC NOTES:  Located in 'Care Everywhere' section of Epic EMR chart  SURGERY NOTES:  Located in 'Care Everywhere' section of Epic EMR chart  PATHOLOGY:  Located in 'Care Everywhere' section of Epic EMR chart  Physical Examination:   There is no height or weight on file to calculate BMI.  Constitutional: Not cachectic. Hygeine adequate.  Eyes: Normal extraocular movements. Sclera nonicteric Neuro: No major focal sensory defects. No major motor deficits. Psych: No severe agitation. No severe anxiety. Judgment & insight Adequate, Oriented x4, HENT: Normocephalic, Mucus membranes moist. No thrush.  Neck: Supple, No tracheal deviation.  Chest: Good respiratory excursion. No audible wheezing CV: No major extremity edema Ext: No obvious deformity or contracture. Edema: not present. No cyanosis Skin: Warm and dry Musculoskeletal: Mobility: no assist device moving easily without restrictions Abdomen: Nontender. Soft. Nondistended.  Gen: Inguinal hernia: Not present. Inguinal lymph nodes: without lymphadenopathy.   Rectal: Perianal skin clear with mild moisture. Right lateral left lateral small verrucous condyloma, 1 in each side. In the anal canal he has a pedunculated friable condylomatous mass with a narrow polyp of moderate size. At least centimeter. Sensitive. Friable. I feel no definite stricture or tumor.  Sphincter tone intact.     .    Assessment and Plan:   [name] is  a 63 y.o. male recovering s/p EUA with biopsies and laser ablation of condyloma in the setting of immunosuppression for kidney transplant.  Diagnoses and all orders for this visit:  Anal condyloma  Immunosuppressed status (CMS/HHS-HCC)  Longstanding persistent atrial fibrillation (CMS/HHS-HCC)  Chronic anticoagulation  Hx of adenomatous colonic polyps  Other orders - fluorouraciL  (EFUDEX ) 5 % cream; Apply topically 2 (two) times daily for 60 days Apply to anus starting after laser surgery for 8 weeks to prevent recurrent lesions   Unfortunately he has developed a second recurrence. Most likely this is a reflection of his immunosuppression on drug therapy to avoid kidney transplant rejection.  I recommended repeat anorectal examination under anesthesia with ablation and biopsy. He has never had anything above low-grade dysplasia/LGSIL. Does not feel like cancer. However wish to be aggressive.  Patient had to postpone surgery in June but is now ready to proceed.  The anatomy & physiology of the anorectal region was discussed. The pathophysiology of anorectal warts and differential diagnosis was discussed. Natural history risks without surgery was discussed such as further growth and cancer. I stressed the importance of office follow-up to catch early recurrence & minimize/halt progression of disease. Interventions such as cauterization by topical agents were discussed.  The patient's symptoms are not adequately controlled by non-operative treatments. I feel the risks & problems of no surgery outweigh the operative risks; therefore, I recommended surgery to treat the anal warts by removal, ablation and/or cauterization.  Risks such as bleeding, infection, need for further treatment, Risks of bleeding, infection, injury to other organs, need for repair of tissues / organs, reoperation, heart attack, death, and other risks were discussed. I noted a good likelihood this will help address the  problem. Goals of post-operative recovery were discussed as well. Possibility that this will not correct all symptoms was explained. Post-operative pain, bleeding, constipation, and other problems after surgery were discussed. We will work to minimize complications. Educational handouts further explaining the pathology, treatment options, and bowel regimen were given as well. Questions were answered. The patient expresses understanding & wishes to proceed with surgery.  Discussed with my colorectal colleague. I think this is a situation where he needs some help with topical therapy. Recent articles talk about using 5% fluorouracil  cream for 8 weeks. Will need to make sure he is inserting it with his finger into the rectum as well. See if that can help prevent a third recurrence. We will see.  Fiber bowel regimen to avoid constipation or diarrhea. That appears to be in good control.   If resolved then go back to survival pathway. Surgical (Rectal exam) follow-up after resection of condyloma acuminatum (warts) with DRE/anoscopy: - every 3 months until negative exam x2, then - every 6 months until negative exam x1, then - every Year until negative exam x1, then - as needed thereafter  Return for WE RECOMMEND SURGERY - See instructions.   Elspeth KYM Schultze, MD, FACS, MASCRS Esophageal, Gastrointestinal & Colorectal Surgery Robotic and Minimally Invasive Surgery  [facility] [address], [address] [place], [place] [address] [phone] Fax [phone] Main  CONTACT INFORMATION: Weekday (9AM-5PM): Call [facility] main office at [phone] Weeknight (5PM-9AM) or Weekend/Holiday: Check EPIC Web Links tab & use AMION (password  [email or web address]) for General Surgery [facility] coverage  Please, DO NOT use SecureChat  (it is  not reliable communication to reach operating surgeons & will lead to a delay in care).   Epic staff messaging available for outptient  concerns needing 1-2 business day response.      10/30/2023

## 2023-10-30 NOTE — H&P (View-Only) (Signed)
 PROVIDER: [name], MD  Patient Care Team: [name], MD as PCP - General (Family Medicine) [name], MD (Cardiovascular Disease) [name], MD (Gastroenterology) SCHULTZE, ELSPETH JUDAH, MD as Consulting Provider (Colon and Rectal Surgery) Tye Millet, DO as Consulting Provider (General Surgery) Auston Elsie Hero as Physician Assistant (Transplant Surgery)  [facility] MRN: [id number] DOB: 1961-02-22 DATE OF ENCOUNTER: 07/14/2023  Interval History:   The patient returns to the office after undergoing  -Anal condyloma fulguration excisional biopsy. Anal examination under anesthesia. 05/31/2022 Dr. Tye -anorectal examination under anesthesia, anal biopsies, laser ablation of anal condylomata 01/08/2023 Dr SCHULTZE  Pathology: Condyloma with low-grade dysplasia  Patient returns for 16-month follow-up after ablation of condyloma and healing. He been feeling okay but noticed some bleeding the past week or so and was concerned. Moving his bowels every day. No fevers or chills  PRIOR NOTE 02/24/2023 Patient returns for second postop visit. Unfortunately week after the last visit he felt weak and was admitted to rule out a stroke. Workup was negative for that. He did have a bump in his creatinine with his kidney transplant though. Followed up with cardiology and restarted on some of his diuretics with adjustment of his medications. Last visit last week increasing his diuretic some more by Dr. Mady with cardiology.  He came in today by himself. He is feeling better. Sounds like he got bad diarrhea with the antibiotics given but now that he stopped that it is better. Usually having 1 or 2 bowel movements. Some irritation occasionally hard. Not on any laxatives or stool softeners at this time. Intermittent on the fiber. No fevers or chills. Been using powders and dry pads around the anus. Some mild anal leakage but it is tapering off. No sharp pains  PRIOR NOTE  01/27/2023 Patient returns feeling relatively well. Having some fecal urgency with eating. No incontinence. Not really on much of a fiber supplement. Not trying Imodium. He has been placed on a short course of antibiotics given concerns about his kidney he tells me today. No fevers or chills. Pain is gone down. Denies much bleeding or drainage. Has been using topical lidocaine  for to help.     Labs, Imaging and Diagnostic Testing:  Located in 'Care Everywhere' section of Epic EMR chart  PRIOR [facility] CLINIC NOTES:  Located in 'Care Everywhere' section of Epic EMR chart  SURGERY NOTES:  Located in 'Care Everywhere' section of Epic EMR chart  PATHOLOGY:  Located in 'Care Everywhere' section of Epic EMR chart  Physical Examination:   There is no height or weight on file to calculate BMI.  Constitutional: Not cachectic. Hygeine adequate.  Eyes: Normal extraocular movements. Sclera nonicteric Neuro: No major focal sensory defects. No major motor deficits. Psych: No severe agitation. No severe anxiety. Judgment & insight Adequate, Oriented x4, HENT: Normocephalic, Mucus membranes moist. No thrush.  Neck: Supple, No tracheal deviation.  Chest: Good respiratory excursion. No audible wheezing CV: No major extremity edema Ext: No obvious deformity or contracture. Edema: not present. No cyanosis Skin: Warm and dry Musculoskeletal: Mobility: no assist device moving easily without restrictions Abdomen: Nontender. Soft. Nondistended.  Gen: Inguinal hernia: Not present. Inguinal lymph nodes: without lymphadenopathy.   Rectal: Perianal skin clear with mild moisture. Right lateral left lateral small verrucous condyloma, 1 in each side. In the anal canal he has a pedunculated friable condylomatous mass with a narrow polyp of moderate size. At least centimeter. Sensitive. Friable. I feel no definite stricture or tumor.  Sphincter tone intact.     .    Assessment and Plan:   [name] is  a 63 y.o. male recovering s/p EUA with biopsies and laser ablation of condyloma in the setting of immunosuppression for kidney transplant.  Diagnoses and all orders for this visit:  Anal condyloma  Immunosuppressed status (CMS/HHS-HCC)  Longstanding persistent atrial fibrillation (CMS/HHS-HCC)  Chronic anticoagulation  Hx of adenomatous colonic polyps  Other orders - fluorouraciL  (EFUDEX ) 5 % cream; Apply topically 2 (two) times daily for 60 days Apply to anus starting after laser surgery for 8 weeks to prevent recurrent lesions   Unfortunately he has developed a second recurrence. Most likely this is a reflection of his immunosuppression on drug therapy to avoid kidney transplant rejection.  I recommended repeat anorectal examination under anesthesia with ablation and biopsy. He has never had anything above low-grade dysplasia/LGSIL. Does not feel like cancer. However wish to be aggressive.  Patient had to postpone surgery in June but is now ready to proceed.  The anatomy & physiology of the anorectal region was discussed. The pathophysiology of anorectal warts and differential diagnosis was discussed. Natural history risks without surgery was discussed such as further growth and cancer. I stressed the importance of office follow-up to catch early recurrence & minimize/halt progression of disease. Interventions such as cauterization by topical agents were discussed.  The patient's symptoms are not adequately controlled by non-operative treatments. I feel the risks & problems of no surgery outweigh the operative risks; therefore, I recommended surgery to treat the anal warts by removal, ablation and/or cauterization.  Risks such as bleeding, infection, need for further treatment, Risks of bleeding, infection, injury to other organs, need for repair of tissues / organs, reoperation, heart attack, death, and other risks were discussed. I noted a good likelihood this will help address the  problem. Goals of post-operative recovery were discussed as well. Possibility that this will not correct all symptoms was explained. Post-operative pain, bleeding, constipation, and other problems after surgery were discussed. We will work to minimize complications. Educational handouts further explaining the pathology, treatment options, and bowel regimen were given as well. Questions were answered. The patient expresses understanding & wishes to proceed with surgery.  Discussed with my colorectal colleague. I think this is a situation where he needs some help with topical therapy. Recent articles talk about using 5% fluorouracil  cream for 8 weeks. Will need to make sure he is inserting it with his finger into the rectum as well. See if that can help prevent a third recurrence. We will see.  Fiber bowel regimen to avoid constipation or diarrhea. That appears to be in good control.   If resolved then go back to survival pathway. Surgical (Rectal exam) follow-up after resection of condyloma acuminatum (warts) with DRE/anoscopy: - every 3 months until negative exam x2, then - every 6 months until negative exam x1, then - every Year until negative exam x1, then - as needed thereafter  Return for WE RECOMMEND SURGERY - See instructions.   Elspeth KYM Schultze, MD, FACS, MASCRS Esophageal, Gastrointestinal & Colorectal Surgery Robotic and Minimally Invasive Surgery  [facility] [address], [address] [place], [place] [address] [phone] Fax [phone] Main  CONTACT INFORMATION: Weekday (9AM-5PM): Call [facility] main office at [phone] Weeknight (5PM-9AM) or Weekend/Holiday: Check EPIC Web Links tab & use AMION (password  [email or web address]) for General Surgery [facility] coverage  Please, DO NOT use SecureChat  (it is  not reliable communication to reach operating surgeons & will lead to a delay in care).   Epic staff messaging available for outptient  concerns needing 1-2 business day response.      10/30/2023

## 2023-10-30 NOTE — Patient Instructions (Signed)

## 2023-10-30 NOTE — Progress Notes (Signed)
 Sent message, via epic in basket, requesting orders in epic from Careers adviser.

## 2023-11-03 DIAGNOSIS — D61818 Other pancytopenia: Secondary | ICD-10-CM | POA: Diagnosis not present

## 2023-11-03 DIAGNOSIS — E119 Type 2 diabetes mellitus without complications: Secondary | ICD-10-CM | POA: Diagnosis not present

## 2023-11-03 DIAGNOSIS — I110 Hypertensive heart disease with heart failure: Secondary | ICD-10-CM | POA: Diagnosis not present

## 2023-11-03 DIAGNOSIS — I4821 Permanent atrial fibrillation: Secondary | ICD-10-CM | POA: Diagnosis not present

## 2023-11-03 DIAGNOSIS — I700 Atherosclerosis of aorta: Secondary | ICD-10-CM | POA: Diagnosis not present

## 2023-11-03 DIAGNOSIS — E662 Morbid (severe) obesity with alveolar hypoventilation: Secondary | ICD-10-CM | POA: Diagnosis not present

## 2023-11-03 DIAGNOSIS — S4292XD Fracture of left shoulder girdle, part unspecified, subsequent encounter for fracture with routine healing: Secondary | ICD-10-CM | POA: Diagnosis not present

## 2023-11-03 DIAGNOSIS — I5042 Chronic combined systolic (congestive) and diastolic (congestive) heart failure: Secondary | ICD-10-CM | POA: Diagnosis not present

## 2023-11-03 DIAGNOSIS — I272 Pulmonary hypertension, unspecified: Secondary | ICD-10-CM | POA: Diagnosis not present

## 2023-11-03 DIAGNOSIS — D696 Thrombocytopenia, unspecified: Secondary | ICD-10-CM | POA: Diagnosis not present

## 2023-11-03 NOTE — Progress Notes (Addendum)
 Anesthesia Review:  PCP: Letvak- LVO 05/04/23 will be switching to Dr Rickford since Dr Jimmy is retirin  Cardiologist : Ezra Shuck LVO 08/21/23  CHF visit- 10/24/23   PPM/ ICD: Device Orders: Rep Notified:  Chest x-ray : 09/26/23- 1 view  EKG : 10/02/23  Echo : 09/27/23  Stress test: Cardiac Cath :  04/04/22  Monitor- 03/09/23   Activity level: can do a flight of stairs without difficutly  Sleep Study/ CPAP : none  Fasting Blood Sugar :      / Checks Blood Sugar -- times a day:     DM- type2- Freestyle Libre  Hgba1c-  11/06/23- 5.5 Mounjaro - LaSt dose on  11/10/23  Semglee - Take 1/2 dose nite before surgery  Farxiga - Hold for 72 hours piror to surgery Last dose on 11/16/23   Blood Thinner/ Instructions /Last Dose: ASA / Instructions/ Last Dose :    Eliquis - last dose on - pt has not yet been given instructions as of preop on 11/06/23.  PT aware to call Dr Sheldon and DR Shuck office for instructons.    Wife with pt at preop appt.    At preop appt pt had not yet received Rectal Prep instructions from Dr Sheldon.  Called [facility] and spoke with Nat and requested.  Emailed to preop nurse.  PT given copy and went over with pt and wife.  PT voiced understanding.  Copy on chart.    NO b/p , etc in left arm.     8/76/25- Infusion    11/06/23- Routed to DR Gross - cbc results of 11/06/2023.  Pltc-100, hgb- 10.2 Burnard Senna ,[facility] aware.  BMp done 11/06/23 routed to DR Gross on 11/06/23. Burnard Senna [name] aware.

## 2023-11-03 NOTE — Patient Instructions (Addendum)
 SURGICAL WAITING ROOM VISITATION  Patients having surgery or a procedure may have no more than 2 support people in the waiting area - these visitors may rotate.    Children under the age of 17 must have an adult with them who is not the patient.  Visitors with respiratory illnesses are discouraged from visiting and should remain at home.  If the patient needs to stay at the hospital during part of their recovery, the visitor guidelines for inpatient rooms apply. Pre-op nurse will coordinate an appropriate time for 1 support person to accompany patient in pre-op.  This support person may not rotate.    Please refer to the [facility] website for the visitor guidelines for Inpatients (after your surgery is over and you are in a regular room).       Your procedure is scheduled on:  11/20/23    Report to [facility] Main Entrance    Report to admitting at  0515 AM   Call this number if you have problems the morning of surgery [phone]   Do not eat food  or drink liquids :After Midnight.   Follow rectal prep instructions per MD.                                   If you have questions, please contact your surgeon's office.       Oral Hygiene is also important to reduce your risk of infection.                                    Remember - BRUSH YOUR TEETH THE MORNING OF SURGERY WITH YOUR REGULAR TOOTHPASTE  DENTURES WILL BE REMOVED PRIOR TO SURGERY PLEASE DO NOT APPLY Poly grip OR ADHESIVES!!!   Do NOT smoke after Midnight   Stop all vitamins and herbal supplements 7 days before surgery.   Take these medicines the morning of surgery with A SIP OF WATER :  nebulilzer if needed, Inhalers as usual and bring, flonase , mycophenalate, uloric , gabapentine, prograf , omeprazole               Farxiga - Hold for 72 hours prior to procedure, Last dose on 11/16/23            Semglee - Take 1/2 dose             Mounjaro -Last dose on   DO NOT TAKE ANY ORAL DIABETIC MEDICATIONS  DAY OF YOUR SURGERY  Bring CPAP mask and tubing day of surgery.                              You may not have any metal on your body including hair pins, jewelry, and body piercing             Do not wear make-up, lotions, powders, perfumes/cologne, or deodorant  Do not wear nail polish including gel and S&S, artificial/acrylic nails, or any other type of covering on natural nails including finger and toenails. If you have artificial nails, gel coating, etc. that needs to be removed by a nail salon please have this removed prior to surgery or surgery may need to be canceled/ delayed if the surgeon/ anesthesia feels like they are unable to be safely monitored.   Do not shave  48 hours prior to surgery.  Men may shave face and neck.   Do not bring valuables to the hospital. [employer] IS NOT             RESPONSIBLE   FOR VALUABLES.   Contacts, glasses, dentures or bridgework may not be worn into surgery.   Bring small overnight bag day of surgery.   DO NOT BRING YOUR HOME MEDICATIONS TO THE HOSPITAL. PHARMACY WILL DISPENSE MEDICATIONS LISTED ON YOUR MEDICATION LIST TO YOU DURING YOUR ADMISSION IN THE HOSPITAL!    Patients discharged on the day of surgery will not be allowed to drive home.  Someone NEEDS to stay with you for the first 24 hours after anesthesia.   Special Instructions: Bring a copy of your healthcare power of attorney and living will documents the day of surgery if you haven't scanned them before.              Please read over the following fact sheets you were given: IF YOU HAVE QUESTIONS ABOUT YOUR PRE-OP INSTRUCTIONS PLEASE CALL [phone].   If you received a COVID test during your pre-op visit  it is requested that you wear a mask when out in public, stay away from anyone that may not be feeling well and notify your surgeon if you develop symptoms. If you test positive for Covid or have been in contact with anyone that has tested positive in the last 10  days please notify you surgeon.    [facility] - Preparing for Surgery Before surgery, you can play an important role.  Because skin is not sterile, your skin needs to be as free of germs as possible.  You can reduce the number of germs on your skin by washing with CHG (chlorahexidine gluconate) soap before surgery.  CHG is an antiseptic cleaner which kills germs and bonds with the skin to continue killing germs even after washing. Please DO NOT use if you have an allergy to CHG or antibacterial soaps.  If your skin becomes reddened/irritated stop using the CHG and inform your nurse when you arrive at Short Stay. Do not shave (including legs and underarms) for at least 48 hours prior to the first CHG shower.  You may shave your face/neck. Please follow these instructions carefully:  1.  Shower with CHG Soap the night before surgery and the  morning of Surgery.  2.  If you choose to wash your hair, wash your hair first as usual with your  normal  shampoo.  3.  After you shampoo, rinse your hair and body thoroughly to remove the  shampoo.                           4.  Use CHG as you would any other liquid soap.  You can apply chg directly  to the skin and wash                       Gently with a scrungie or clean washcloth.  5.  Apply the CHG Soap to your body ONLY FROM THE NECK DOWN.   Do not use on face/ open                           Wound or open sores. Avoid contact with eyes, ears mouth and genitals (private parts).  Wash face,  Genitals (private parts) with your normal soap.             6.  Wash thoroughly, paying special attention to the area where your surgery  will be performed.  7.  Thoroughly rinse your body with warm water  from the neck down.  8.  DO NOT shower/wash with your normal soap after using and rinsing off  the CHG Soap.                9.  Pat yourself dry with a clean towel.            10.  Wear clean pajamas.            11.  Place clean sheets on your bed  the night of your first shower and do not  sleep with pets. Day of Surgery : Do not apply any lotions/deodorants the morning of surgery.  Please wear clean clothes to the hospital/surgery center.  FAILURE TO FOLLOW THESE INSTRUCTIONS MAY RESULT IN THE CANCELLATION OF YOUR SURGERY PATIENT SIGNATURE_________________________________  NURSE SIGNATURE__________________________________  ________________________________________________________________________

## 2023-11-04 ENCOUNTER — Encounter: Payer: Self-pay | Admitting: Internal Medicine

## 2023-11-04 ENCOUNTER — Ambulatory Visit (INDEPENDENT_AMBULATORY_CARE_PROVIDER_SITE_OTHER): Admitting: Internal Medicine

## 2023-11-04 ENCOUNTER — Other Ambulatory Visit: Payer: Self-pay

## 2023-11-04 VITALS — BP 128/66 | HR 43 | Temp 97.8°F | Ht 69.5 in | Wt 245.0 lb

## 2023-11-04 DIAGNOSIS — D61818 Other pancytopenia: Secondary | ICD-10-CM | POA: Diagnosis not present

## 2023-11-04 DIAGNOSIS — I5042 Chronic combined systolic (congestive) and diastolic (congestive) heart failure: Secondary | ICD-10-CM | POA: Diagnosis not present

## 2023-11-04 DIAGNOSIS — N401 Enlarged prostate with lower urinary tract symptoms: Secondary | ICD-10-CM

## 2023-11-04 DIAGNOSIS — I700 Atherosclerosis of aorta: Secondary | ICD-10-CM | POA: Diagnosis not present

## 2023-11-04 DIAGNOSIS — N184 Chronic kidney disease, stage 4 (severe): Secondary | ICD-10-CM

## 2023-11-04 DIAGNOSIS — D696 Thrombocytopenia, unspecified: Secondary | ICD-10-CM | POA: Diagnosis not present

## 2023-11-04 DIAGNOSIS — Z794 Long term (current) use of insulin: Secondary | ICD-10-CM

## 2023-11-04 DIAGNOSIS — I5032 Chronic diastolic (congestive) heart failure: Secondary | ICD-10-CM | POA: Diagnosis not present

## 2023-11-04 DIAGNOSIS — I272 Pulmonary hypertension, unspecified: Secondary | ICD-10-CM | POA: Diagnosis not present

## 2023-11-04 DIAGNOSIS — S4292XD Fracture of left shoulder girdle, part unspecified, subsequent encounter for fracture with routine healing: Secondary | ICD-10-CM | POA: Diagnosis not present

## 2023-11-04 DIAGNOSIS — Z0000 Encounter for general adult medical examination without abnormal findings: Secondary | ICD-10-CM

## 2023-11-04 DIAGNOSIS — I4821 Permanent atrial fibrillation: Secondary | ICD-10-CM | POA: Diagnosis not present

## 2023-11-04 DIAGNOSIS — E662 Morbid (severe) obesity with alveolar hypoventilation: Secondary | ICD-10-CM | POA: Diagnosis not present

## 2023-11-04 DIAGNOSIS — E1121 Type 2 diabetes mellitus with diabetic nephropathy: Secondary | ICD-10-CM | POA: Diagnosis not present

## 2023-11-04 DIAGNOSIS — I110 Hypertensive heart disease with heart failure: Secondary | ICD-10-CM | POA: Diagnosis not present

## 2023-11-04 DIAGNOSIS — R3911 Hesitancy of micturition: Secondary | ICD-10-CM

## 2023-11-04 DIAGNOSIS — E119 Type 2 diabetes mellitus without complications: Secondary | ICD-10-CM | POA: Diagnosis not present

## 2023-11-04 LAB — HM DIABETES FOOT EXAM

## 2023-11-04 MED ORDER — HYDROCODONE-ACETAMINOPHEN 5-325 MG PO TABS
1.0000 | ORAL_TABLET | Freq: Four times a day (QID) | ORAL | 0 refills | Status: DC | PRN
  Filled 2023-11-04: qty 60, 15d supply, fill #0

## 2023-11-04 MED ORDER — TADALAFIL 20 MG PO TABS
10.0000 mg | ORAL_TABLET | ORAL | 11 refills | Status: DC | PRN
  Filled 2023-11-04: qty 6, 30d supply, fill #0

## 2023-11-04 MED ORDER — DIAZEPAM 5 MG PO TABS
10.0000 mg | ORAL_TABLET | Freq: Once | ORAL | 0 refills | Status: DC | PRN
  Filled 2023-11-04: qty 2, 1d supply, fill #0

## 2023-11-04 NOTE — Assessment & Plan Note (Signed)
 Rate controlled Is on the eliquis 

## 2023-11-04 NOTE — Assessment & Plan Note (Signed)
 Compensated with bid torsemide 

## 2023-11-04 NOTE — Progress Notes (Signed)
 Subjective:    Patient ID: [name]., male    DOB: 01/15/1961, 63 y.o.   MRN: [id number]  HPI Here for physical  Heart is okay No palpitations No chest pain or SOB Intermittent edema---better with elevation Sleeps in bed again---no PND No dizziness or syncope  GFR under 30 and as low as 17 Last was 23 Continues working with [facility] as transplant team  Ongoing issues with anal condyloma Is due for another surgical procedure soon  Admitted with aphasia back in November No clear stroke though  Recent fall with shoulder fracture, then nasal damage after another fall Elbow pain but now improved May have rotator cuff tear---so getting MRI Working with PT  Diabetes control has been good Still follows with Dr Damian  Current Outpatient Medications on File Prior to Visit  Medication Sig Dispense Refill   albuterol  (PROVENTIL ) (2.5 MG/3ML) 0.083% nebulizer solution Take 3 mLs (2.5 mg total) by nebulization every 6 (six) hours as needed for wheezing or shortness of breath. 150 mL 0   albuterol  (VENTOLIN  HFA) 108 (90 Base) MCG/ACT inhaler Inhale 2 puffs into the lungs every 6 (six) hours as needed for wheezing or shortness of breath. 6.7 g 5   apixaban  (ELIQUIS ) 5 MG TABS tablet Take 1 tablet (5 mg total) by mouth 2 (two) times daily. 180 tablet 3   buPROPion  (WELLBUTRIN  SR) 150 MG 12 hr tablet Take 1 tablet (150 mg total) by mouth 2 (two) times daily. 180 tablet 3   Continuous Glucose Sensor (FREESTYLE LIBRE 3 SENSOR) MISC Use one every 2 weeks. 6 each 3   Cyanocobalamin  (B-12 PO) Take 1 tablet by mouth daily.     dapagliflozin  propanediol (FARXIGA ) 10 MG TABS tablet Take 1 tablet (10 mg total) by mouth daily before breakfast. 90 tablet 3   febuxostat  (ULORIC ) 40 MG tablet Take 3 tablets (120 mg total) by mouth daily. 90 tablet 5   fluticasone  (FLONASE ) 50 MCG/ACT nasal spray Place 2 sprays into both nostrils daily. 16 g 11   gabapentin  (NEURONTIN ) 300 MG capsule Take 1  capsule (300 mg total) by mouth 2 (two) times daily. 180 capsule 3   HYDROcodone -acetaminophen  (NORCO/VICODIN) 5-325 MG tablet Take 1 tablet by mouth every 6 (six) hours as needed for moderate pain (pain score 4-6). 60 tablet 0   insulin  glargine-yfgn (SEMGLEE ) 100 UNIT/ML Pen Inject 20 Units into the skin daily. 45 mL 3   Insulin  Pen Needle (TECHLITE PEN NEEDLES) 31G X 5 MM MISC 4 (four) times daily. 400 each 3   Insulin  Pen Needle (UNIFINE PENTIPS) 31G X 5 MM MISC Use 4 times daily as directed 400 each 3   losartan  (COZAAR ) 25 MG tablet Take 1 tablet (25 mg total) by mouth daily. 90 tablet 3   metolazone  (ZAROXOLYN ) 2.5 MG tablet Take 1 tablet (2.5 mg total) by mouth as directed. By the heart failure clinic 30 tablet 1   montelukast  (SINGULAIR ) 10 MG tablet Take 1 tablet (10 mg total) by mouth at bedtime. 30 tablet 11   mycophenolate  (MYFORTIC ) 180 MG EC tablet Take 2 tablets (360 mg total) by mouth every morning AND 1 tablet (180 mg total) every evening. 360 tablet 3   omeprazole  (PRILOSEC) 20 MG capsule Take 1 capsule (20 mg total) by mouth daily. 30 capsule 11   potassium chloride  SA (KLOR-CON  M) 20 MEQ tablet Take 1 tablet (20 mEq total) by mouth daily as needed. 90 tablet 3   predniSONE  (DELTASONE ) 5 MG  tablet Take 1 tablet (5 mg total) by mouth daily. 90 tablet 3   rosuvastatin  (CRESTOR ) 10 MG tablet Take 1 tablet (10 mg total) by mouth daily. 90 tablet 3   silver  sulfADIAZINE  (SILVADENE ) 1 % cream Apply 1 Application topically daily. 50 g 1   sodium chloride  (OCEAN) 0.65 % SOLN nasal spray Place 2 sprays into both nostrils every 8 (eight) hours for 10 days.     sulfamethoxazole -trimethoprim  (BACTRIM ) 400-80 MG tablet Take 1 tablet by mouth 3 (three) times a week. 36 tablet 3   tacrolimus  (PROGRAF ) 1 MG capsule Take 2 capsules (2 mg total) by mouth every morning AND 1 capsule (1 mg total) every evening. 90 capsule 11   tadalafil  (CIALIS ) 5 MG tablet Take 1 tablet (5 mg total) by mouth daily  as needed for erectile dysfunction. 90 tablet 0   tamsulosin  (FLOMAX ) 0.4 MG CAPS capsule Take 1 capsule (0.4 mg total) by mouth daily. 30 capsule 11   Testosterone  20.25 MG/ACT (1.62%) GEL Place 2 Pump onto the skin daily. 150 g 3   tirzepatide  (MOUNJARO ) 5 MG/0.5ML Pen Inject 5 mg into the skin once a week. 2 mL 5   tiZANidine  (ZANAFLEX ) 2 MG tablet Take 1 tablet (2 mg total) by mouth every 6 (six) hours as needed for muscle spasms. 30 tablet 1   torsemide  (DEMADEX ) 100 MG tablet Take 1 tablet (100 mg total) by mouth 2 (two) times daily. 90 tablet 1   Zinc  50 MG TABS Take 1 tablet (50 mg total) by mouth daily. 100 tablet 3   No current facility-administered medications on file prior to visit.    Allergies  Allergen Reactions   Iodinated Contrast Media Other (See Comments)    Kidney transplant  Iodinated contrast media (substance)   Iodine Other (See Comments)    Kidney transplant   Acetaminophen  Other (See Comments)    BC of transplant  acetaminophen    Nsaids Other (See Comments)    Kidney transplant   Ibuprofen Other (See Comments)    Due to kidney transplant    Past Medical History:  Diagnosis Date   Anal condyloma    Anemia    Aortic atherosclerosis (HCC)    Aortic stenosis    a.) TTE 10/2021: Mod AS. AVA 1.06cm^2 (VTI). Mean grad ; b.) TTE 04/02/2022: no AV stenosis   Asthma, persistent controlled 02/25/2013   Attention or concentration deficit 04/26/2021   BPH with obstruction/lower urinary tract symptoms 09/25/2014   CAD (coronary artery disease) 2005   a.) LHC 1996 -> HG stenosis mLAD -> PTCA/PCI with stent x 1 (unk type) -> complicated by CFA pseudoanurysm (required surg);  b.) LHC 08/10/2002  -> 95% LAD, 70% ISR LAD, 80% pOM, 70% mRCA --> CVTS consult; c.) 4v CABG 08/13/2002; c.) 03/2018 MV: Small, fixed inferoapical and apical sep defect. No isc. EF 47% (60-65% by 05/2018 TTE); d.) 02/2021 MV: small Apical inf fixed defect. No isc -> low risk.   Cardiomegaly     Cellulitis and abscess of left leg 07/22/2020   Chronic atrial fibrillation    a.) CHA2DS2VASc = 4 (CHF, HTN, vascular disease history, T2DM);  b.) rate/rhythm maintained without pharmacological intervention; chronically anticoagulated with apixaban    Chronic combined systolic and diastolic CHF (congestive heart failure)    a.) TTE 7/18 : EF 45-50%; b.) TTE 05/2018 : EF 60-65%, RVSP 74.8; c.) TTE 12/2018: EF 50-55%. Sev dil LA; d.) TTE 04/2019: EF 45-50%; e.) TTE 07/2021: EF 40-45%, G3DD; f.) TTE  10/2021: EF 40-45%, mild conc LVH, septal-lat dyssynchrony (LBBB), mildly red RVSF, mild-mod dil LA, mildly dil RA, mild-mod MS, mod AS; g.) TTE 04/02/2022: EF 40-45%, glob HK, LVH, red RVSF, RVE, sev LAE, mild-mod RAE, mild MR   Chronic venous stasis dermatitis of both lower extremities    Cytomegaloviral disease 2017   Diverticulosis of colon 04/22/2013   Dyspnea    Elevated RIGHT hemidiaphragm    Erectile dysfunction    a.) on topical TRT + Trimix (alprostadil/papaverine/phentolamine) injections   ESRD (end stage renal disease) (HCC)    a.) dialysis dependent 2004-2012; b.) s/p cadaveric RIGHT renal transplant 09/13/2010   Essential hypertension 12/17/2010   GERD (gastroesophageal reflux disease)    Gout    History of 2019 novel coronavirus disease (COVID-19) 06/30/2019   History of bilateral cataract extraction 10/2017   History of renal transplant 09/13/2010   a.) s/p cadaveric donor transplant 09/13/2010   Hx of bilateral cataract extraction 10/2017   Hyperlipidemia    Hypogonadism in male 09/25/2014   Ischemic cardiomyopathy    Left cephalic vein thrombosis 06/2019   Long term current use of anticoagulant    a.) apixaban    Long term current use of immunosuppressive drug    a.) mycophenolate  + prednisone  + tacrolimus    Major depressive disorder 10/04/2013   Mitral stenosis 11/07/2021   a.) TTE 11/07/2021: severe MAC, mild-mod MS (MPG 6 mmHg); b.) TTE 04/02/2022: no MV stenosis   Murmur     Obesity hypoventilation syndrome 03/31/2012   OSA treated with BiPAP 09/29/2012   PAH (pulmonary artery hypertension)    a. 04/2019 RHC: RA 19, RV 80/20, PA 78/31 (51), PCWP 25, CO/CI 7.69/3.26. PVR 3.25 --> Sev PAH, likely primarily PV HTN; b.) RHC 04/04/2022: mRA 13, mPA 40, mPCWP 20, PA sat 59, AO sat 92, CO 7.63, CI 3.26, PVR 2.6 --> mod portal venous HTN   Pancreatitis    S/P CABG x 4 08/13/2002   a.) LIMA-LAD, LRA-OM, SVG-D1, SVG-dRCA   S/P gastric bypass    Synovitis of finger 06/11/2019   Trigger finger, unspecified little finger 12/23/2018   Type 2 diabetes mellitus with hyperglycemia, with long-term current use of insulin  09/09/2014   a.) has FreeStyle Libre CGM   Ulcer    Left shin goes to wound care center at [facility] qweek   Wears glasses    Wears hearing aid in both ears     Past Surgical History:  Procedure Laterality Date   CARDIAC CATHETERIZATION N/A 08/10/2002   CATARACT EXTRACTION W/PHACO Right 10/29/2017   Procedure: CATARACT EXTRACTION PHACO AND INTRAOCULAR LENS PLACEMENT (IOC)  RIGHT DIABETIC;  Surgeon: Mittie Gaskin, MD;  Location: [facility];  Service: Ophthalmology;  Laterality: Right   CATARACT EXTRACTION W/PHACO Left 11/18/2017   Procedure: CATARACT EXTRACTION PHACO AND INTRAOCULAR LENS PLACEMENT (IOC) LEFT IVA/TOPICAL;  Surgeon: Mittie Gaskin, MD;  Location: [facility];  Service: Ophthalmology;  Laterality: Left   COLONOSCOPY WITH PROPOFOL  N/A 04/22/2013   Procedure: COLONOSCOPY WITH PROPOFOL ;  Surgeon: Toribio SHAUNNA Cedar, MD;  Location: [facility];  Service: Endoscopy;  Laterality: N/A;   CORONARY ANGIOPLASTY WITH STENT PLACEMENT N/A 1996   CORONARY ARTERY BYPASS GRAFT N/A 08/13/2002   Procedure: 4v CORONARY ARTERY BYPASS GRAFTING; Location: [facility]; Surgeon: Dessa Laine, MD   CYSTOSCOPY WITH INSERTION OF UROLIFT N/A 12/25/2021   Procedure: CYSTOSCOPY WITH INSERTION OF UROLIFT;  Surgeon: Twylla Glendia BROCKS, MD;  Location:  [facility];  Service: Urology;  Laterality: N/A;   DG  ANGIO AV SHUNT*L*     right and left upper arms   FASCIOTOMY  03/03/2012   Procedure: FASCIOTOMY;  Surgeon: Arley JONELLE Curia, MD;  Location: [facility];  Service: Orthopedics;  Laterality: Right;  FASCIOTOMY RIGHT SMALL FINGER   FASCIOTOMY Left 08/17/2013   Procedure: FASCIOTOMY LEFT RING;  Surgeon: Arley JONELLE Curia, MD;  Location: [facility];  Service: Orthopedics;  Laterality: Left;   INCISION AND DRAINAGE ABSCESS Left 10/15/2015   Procedure: INCISION AND DRAINAGE ABSCESS;  Surgeon: Laneta JULIANNA Luna, MD;  Location: [facility];  Service: General;  Laterality: Left;   KIDNEY TRANSPLANT  09/13/2010   cadaver--at [facility]   LASER ABLATION CONDOLAMATA N/A 01/08/2023   Procedure: LASER REMOVAL ABLATION OF CONDYLOMATA;  Surgeon: Sheldon Standing, MD;  Location: [facility];  Service: General;  Laterality: N/A;  GEN AND LOCAL   RECTAL EXAM UNDER ANESTHESIA N/A 01/08/2023   Procedure: ANORECTAL EXAM UNDER ANESTHESIA;  Surgeon: Sheldon Standing, MD;  Location: [facility];  Service: General;  Laterality: N/A;   RIGHT HEART CATH N/A 11/15/2016   Procedure: RIGHT HEART CATH;  Surgeon: Darron Deatrice LABOR, MD;  Location: [facility];  Service: Cardiovascular;  Laterality: N/A;   RIGHT HEART CATH N/A 05/03/2019   Procedure: RIGHT HEART CATH;  Surgeon: [name], MD;  Location: [facility];  Service: Cardiovascular;  Laterality: N/A;   RIGHT HEART CATH N/A 04/04/2022   Procedure: RIGHT HEART CATH;  Surgeon: [name], MD;  Location: [facility] CV [facility];  Service: Cardiovascular;  Laterality: N/A;   ROUX-EN-Y GASTRIC BYPASS N/A 2015   TYMPANIC MEMBRANE REPAIR Left 03/2010   VASECTOMY     WART FULGURATION N/A 05/31/2022   Procedure: FULGURATION ANAL WART;  Surgeon: Tye Millet, DO;  Location: [facility];  Service: General;  Laterality: N/A;    Family History  Problem Relation Age of Onset   Heart disease Father    Kidney  failure Father    Kidney disease Father    Diabetes Maternal Grandmother    Breast cancer Maternal Grandmother    Valvular heart disease Mother    Liver cancer Paternal Uncle    Liver cancer Paternal Grandmother    Prostate cancer Neg Hx     Social History   Socioeconomic History   Marital status: Married    Spouse name: Not on file   Number of children: 2   Years of education: 14   Highest education level: Associate degree: academic program  Occupational History   Occupation: Presenter, broadcasting: unemployed    Comment: disabled due to kidney failure  Tobacco Use   Smoking status: Former    Types: Cigars    Quit date: 09/02/1994    Years since quitting: 29.1    Passive exposure: Never   Smokeless tobacco: Never  Vaping Use   Vaping status: Never Used  Substance and Sexual Activity   Alcohol use: Not Currently    Comment: occ   Drug use: No   Sexual activity: Not on file  Other Topics Concern   Not on file  Social History Narrative   Has living will   Wife is health care POA---son Donnice is alternate   Would accept resuscitation attempts   No tube feedings if cognitively unaware   Social Drivers of Health   Financial Resource Strain: Not on file  Food Insecurity: No Food Insecurity (09/25/2023)   Hunger Vital Sign    Worried About  Running Out of Food in the Last Year: Never true    Ran Out of Food in the Last Year: Never true  Transportation Needs: No Transportation Needs (09/25/2023)   PRAPARE - Administrator, Civil Service (Medical): No    Lack of Transportation (Non-Medical): No  Physical Activity: Not on file  Stress: Not on file  Social Connections: Socially Integrated (04/11/2023)   Social Connection and Isolation Panel    Frequency of Communication with Friends and Family: More than three times a week    Frequency of Social Gatherings with Friends and Family: More than three times a week    Attends Religious Services:  More than 4 times per year    Active Member of [employer] or Organizations: Yes    Attends Engineer, structural: More than 4 times per year    Marital Status: Married  Catering manager Violence: Not At Risk (09/25/2023)   Humiliation, Afraid, Rape, and Kick questionnaire    Fear of Current or Ex-Partner: No    Emotionally Abused: No    Physically Abused: No    Sexually Abused: No   Review of Systems  Constitutional:  Negative for unexpected weight change.       Wears seat belt  HENT:  Negative for dental problem, tinnitus and trouble swallowing.        Hearing is helped by aides Keeps up with dentist  Eyes:  Negative for visual disturbance.       No diplopia or unilateral vision loss  Respiratory:  Negative for cough, chest tightness and shortness of breath.   Cardiovascular:  Positive for leg swelling. Negative for chest pain and palpitations.  Gastrointestinal:  Negative for blood in stool and constipation.       No heartburn  Endocrine: Negative for polydipsia and polyuria.  Genitourinary:  Positive for frequency. Negative for difficulty urinating.       Some ED---wants to try meds  Musculoskeletal:  Positive for arthralgias and back pain. Negative for joint swelling.  Skin:  Negative for rash.  Allergic/Immunologic: Positive for environmental allergies. Negative for immunocompromised state.       Uses flonase  prn  Neurological:  Negative for dizziness, syncope, light-headedness and headaches.  Hematological:  Negative for adenopathy. Bruises/bleeds easily.  Psychiatric/Behavioral:         Depression is better---with improvement of shoulder Frustrated by limitations       Objective:   Physical Exam Constitutional:      Appearance: Normal appearance.  HENT:     Mouth/Throat:     Pharynx: No oropharyngeal exudate or posterior oropharyngeal erythema.  Eyes:     Conjunctiva/sclera: Conjunctivae normal.     Pupils: Pupils are equal, round, and reactive to light.   Cardiovascular:     Rate and Rhythm: Normal rate. Rhythm irregular.     Pulses: Normal pulses.     Heart sounds:     No gallop.     Comments: Soft systolic murmur Pulmonary:     Effort: Pulmonary effort is normal.     Breath sounds: Normal breath sounds. No wheezing or rales.  Abdominal:     Palpations: Abdomen is soft.     Tenderness: There is no abdominal tenderness.  Musculoskeletal:     Cervical back: Neck supple.     Right lower leg: No edema.     Left lower leg: No edema.  Lymphadenopathy:     Cervical: No cervical adenopathy.  Skin:    Findings: No lesion  or rash.     Comments: No foot lesions Pimple on nose--doesn't look neoplastic  Neurological:     General: No focal deficit present.     Mental Status: He is alert and oriented to person, place, and time.     Comments: Normal sensation in feet  Psychiatric:        Mood and Affect: Mood normal.        Behavior: Behavior normal.            Assessment & Plan:

## 2023-11-04 NOTE — Assessment & Plan Note (Signed)
 Dr Damian is managing

## 2023-11-04 NOTE — Assessment & Plan Note (Signed)
 Tough year with fall /injury, etc Colon due 2028 Recent normal PSA Flu/COVID/RSV this fall

## 2023-11-04 NOTE — Assessment & Plan Note (Signed)
 Follows with transplant team

## 2023-11-05 ENCOUNTER — Other Ambulatory Visit: Payer: Self-pay | Admitting: Oncology

## 2023-11-05 ENCOUNTER — Inpatient Hospital Stay: Attending: Oncology

## 2023-11-05 ENCOUNTER — Other Ambulatory Visit: Payer: Self-pay

## 2023-11-05 ENCOUNTER — Inpatient Hospital Stay

## 2023-11-05 VITALS — BP 144/64 | HR 42 | Temp 96.1°F | Resp 18

## 2023-11-05 DIAGNOSIS — Z800 Family history of malignant neoplasm of digestive organs: Secondary | ICD-10-CM | POA: Diagnosis not present

## 2023-11-05 DIAGNOSIS — M25512 Pain in left shoulder: Secondary | ICD-10-CM | POA: Diagnosis not present

## 2023-11-05 DIAGNOSIS — D509 Iron deficiency anemia, unspecified: Secondary | ICD-10-CM | POA: Diagnosis not present

## 2023-11-05 DIAGNOSIS — D631 Anemia in chronic kidney disease: Secondary | ICD-10-CM | POA: Diagnosis not present

## 2023-11-05 DIAGNOSIS — D508 Other iron deficiency anemias: Secondary | ICD-10-CM

## 2023-11-05 DIAGNOSIS — Z79899 Other long term (current) drug therapy: Secondary | ICD-10-CM | POA: Diagnosis not present

## 2023-11-05 DIAGNOSIS — Z940 Kidney transplant status: Secondary | ICD-10-CM | POA: Diagnosis not present

## 2023-11-05 DIAGNOSIS — N184 Chronic kidney disease, stage 4 (severe): Secondary | ICD-10-CM | POA: Diagnosis not present

## 2023-11-05 DIAGNOSIS — Z87891 Personal history of nicotine dependence: Secondary | ICD-10-CM | POA: Diagnosis not present

## 2023-11-05 DIAGNOSIS — Z803 Family history of malignant neoplasm of breast: Secondary | ICD-10-CM | POA: Diagnosis not present

## 2023-11-05 MED ORDER — DIAZEPAM 5 MG PO TABS: 5.0000 mg | ORAL_TABLET | ORAL | 0 refills | Status: DC

## 2023-11-05 MED ORDER — IRON SUCROSE 20 MG/ML IV SOLN
200.0000 mg | INTRAVENOUS | Status: DC
  Administered 2023-11-05 (×2): 200 mg via INTRAVENOUS
  Filled 2023-11-05: qty 10

## 2023-11-05 MED ORDER — SODIUM CHLORIDE 0.9% FLUSH
10.0000 mL | Freq: Once | INTRAVENOUS | Status: AC | PRN
Start: 2023-11-05 — End: 2023-11-05
  Administered 2023-11-05 (×2): 10 mL
  Filled 2023-11-05: qty 10

## 2023-11-06 ENCOUNTER — Other Ambulatory Visit: Payer: Self-pay

## 2023-11-06 ENCOUNTER — Ambulatory Visit: Payer: Self-pay | Admitting: Surgery

## 2023-11-06 ENCOUNTER — Encounter (HOSPITAL_COMMUNITY)
Admission: RE | Admit: 2023-11-06 | Discharge: 2023-11-06 | Disposition: A | Discharge status: Home / Self Care | Source: Ambulatory Visit | Attending: Surgery | Admitting: Surgery

## 2023-11-06 ENCOUNTER — Encounter (HOSPITAL_COMMUNITY): Payer: Self-pay

## 2023-11-06 DIAGNOSIS — Z01812 Encounter for preprocedural laboratory examination: Secondary | ICD-10-CM | POA: Diagnosis not present

## 2023-11-06 DIAGNOSIS — D61818 Other pancytopenia: Secondary | ICD-10-CM | POA: Diagnosis not present

## 2023-11-06 DIAGNOSIS — E1165 Type 2 diabetes mellitus with hyperglycemia: Secondary | ICD-10-CM | POA: Insufficient documentation

## 2023-11-06 DIAGNOSIS — N184 Chronic kidney disease, stage 4 (severe): Secondary | ICD-10-CM | POA: Insufficient documentation

## 2023-11-06 DIAGNOSIS — E662 Morbid (severe) obesity with alveolar hypoventilation: Secondary | ICD-10-CM | POA: Diagnosis not present

## 2023-11-06 DIAGNOSIS — I110 Hypertensive heart disease with heart failure: Secondary | ICD-10-CM | POA: Diagnosis not present

## 2023-11-06 DIAGNOSIS — Z01818 Encounter for other preprocedural examination: Secondary | ICD-10-CM | POA: Diagnosis present

## 2023-11-06 DIAGNOSIS — S4292XD Fracture of left shoulder girdle, part unspecified, subsequent encounter for fracture with routine healing: Secondary | ICD-10-CM | POA: Diagnosis not present

## 2023-11-06 DIAGNOSIS — Z794 Long term (current) use of insulin: Secondary | ICD-10-CM | POA: Diagnosis not present

## 2023-11-06 DIAGNOSIS — I10 Essential (primary) hypertension: Secondary | ICD-10-CM | POA: Diagnosis not present

## 2023-11-06 DIAGNOSIS — I4821 Permanent atrial fibrillation: Secondary | ICD-10-CM | POA: Diagnosis not present

## 2023-11-06 DIAGNOSIS — I5042 Chronic combined systolic (congestive) and diastolic (congestive) heart failure: Secondary | ICD-10-CM | POA: Diagnosis not present

## 2023-11-06 DIAGNOSIS — D696 Thrombocytopenia, unspecified: Secondary | ICD-10-CM | POA: Diagnosis not present

## 2023-11-06 DIAGNOSIS — E1122 Type 2 diabetes mellitus with diabetic chronic kidney disease: Secondary | ICD-10-CM | POA: Insufficient documentation

## 2023-11-06 DIAGNOSIS — E119 Type 2 diabetes mellitus without complications: Secondary | ICD-10-CM | POA: Diagnosis not present

## 2023-11-06 DIAGNOSIS — I272 Pulmonary hypertension, unspecified: Secondary | ICD-10-CM | POA: Diagnosis not present

## 2023-11-06 DIAGNOSIS — I700 Atherosclerosis of aorta: Secondary | ICD-10-CM | POA: Diagnosis not present

## 2023-11-06 HISTORY — DX: Anxiety disorder, unspecified: F41.9

## 2023-11-06 LAB — CBC
HCT: 35.2 % — ABNORMAL LOW (ref 39.0–52.0)
Hemoglobin: 10.2 g/dL — ABNORMAL LOW (ref 13.0–17.0)
MCH: 28.5 pg (ref 26.0–34.0)
MCHC: 29 g/dL — ABNORMAL LOW (ref 30.0–36.0)
MCV: 98.3 fL (ref 80.0–100.0)
Platelets: 100 K/uL — ABNORMAL LOW (ref 150–400)
RBC: 3.58 MIL/uL — ABNORMAL LOW (ref 4.22–5.81)
RDW: 15.6 % — ABNORMAL HIGH (ref 11.5–15.5)
WBC: 2.8 K/uL — ABNORMAL LOW (ref 4.0–10.5)
nRBC: 0 % (ref 0.0–0.2)

## 2023-11-06 LAB — BASIC METABOLIC PANEL WITH GFR
Anion gap: 12 (ref 5–15)
BUN: 73 mg/dL — ABNORMAL HIGH (ref 8–23)
CO2: 29 mmol/L (ref 22–32)
Calcium: 8.8 mg/dL — ABNORMAL LOW (ref 8.9–10.3)
Chloride: 99 mmol/L (ref 98–111)
Creatinine, Ser: 2.42 mg/dL — ABNORMAL HIGH (ref 0.61–1.24)
GFR, Estimated: 29 mL/min — ABNORMAL LOW (ref >60)
Glucose, Bld: 105 mg/dL — ABNORMAL HIGH (ref 70–99)
Potassium: 3.2 mmol/L — ABNORMAL LOW (ref 3.5–5.1)
Sodium: 140 mmol/L (ref 135–145)

## 2023-11-06 LAB — HEMOGLOBIN A1C
Hgb A1c MFr Bld: 5.5 % (ref 4.8–5.6)
Mean Plasma Glucose: 111.15 mg/dL

## 2023-11-06 LAB — GLUCOSE, CAPILLARY: Glucose-Capillary: 98 mg/dL (ref 70–99)

## 2023-11-10 DIAGNOSIS — I5042 Chronic combined systolic (congestive) and diastolic (congestive) heart failure: Secondary | ICD-10-CM | POA: Diagnosis not present

## 2023-11-10 DIAGNOSIS — E662 Morbid (severe) obesity with alveolar hypoventilation: Secondary | ICD-10-CM | POA: Diagnosis not present

## 2023-11-10 DIAGNOSIS — D61818 Other pancytopenia: Secondary | ICD-10-CM | POA: Diagnosis not present

## 2023-11-10 DIAGNOSIS — E119 Type 2 diabetes mellitus without complications: Secondary | ICD-10-CM | POA: Diagnosis not present

## 2023-11-10 DIAGNOSIS — I700 Atherosclerosis of aorta: Secondary | ICD-10-CM | POA: Diagnosis not present

## 2023-11-10 DIAGNOSIS — I272 Pulmonary hypertension, unspecified: Secondary | ICD-10-CM | POA: Diagnosis not present

## 2023-11-10 DIAGNOSIS — D696 Thrombocytopenia, unspecified: Secondary | ICD-10-CM | POA: Diagnosis not present

## 2023-11-10 DIAGNOSIS — S4292XD Fracture of left shoulder girdle, part unspecified, subsequent encounter for fracture with routine healing: Secondary | ICD-10-CM | POA: Diagnosis not present

## 2023-11-10 DIAGNOSIS — I110 Hypertensive heart disease with heart failure: Secondary | ICD-10-CM | POA: Diagnosis not present

## 2023-11-10 DIAGNOSIS — I4821 Permanent atrial fibrillation: Secondary | ICD-10-CM | POA: Diagnosis not present

## 2023-11-10 NOTE — Progress Notes (Signed)
 Called admitting on 11/10/23 and spoke with Will and informed that pt needs surgery day on 11/20/23 stickers.  There is not hospital account listed under patient station for that day.  Will stated he would make call to take care. Of.   Called Admitting on 11/11/2023 and Will stated that Debbie who would handle this is on vacation.  Will speak with Trevette today on 11/11/2023 in regards to no account for surgery on 11/20/23.

## 2023-11-11 DIAGNOSIS — I110 Hypertensive heart disease with heart failure: Secondary | ICD-10-CM | POA: Diagnosis not present

## 2023-11-11 DIAGNOSIS — I700 Atherosclerosis of aorta: Secondary | ICD-10-CM | POA: Diagnosis not present

## 2023-11-11 DIAGNOSIS — I4821 Permanent atrial fibrillation: Secondary | ICD-10-CM | POA: Diagnosis not present

## 2023-11-11 DIAGNOSIS — S4292XD Fracture of left shoulder girdle, part unspecified, subsequent encounter for fracture with routine healing: Secondary | ICD-10-CM | POA: Diagnosis not present

## 2023-11-11 DIAGNOSIS — E119 Type 2 diabetes mellitus without complications: Secondary | ICD-10-CM | POA: Diagnosis not present

## 2023-11-11 DIAGNOSIS — E662 Morbid (severe) obesity with alveolar hypoventilation: Secondary | ICD-10-CM | POA: Diagnosis not present

## 2023-11-11 DIAGNOSIS — D696 Thrombocytopenia, unspecified: Secondary | ICD-10-CM | POA: Diagnosis not present

## 2023-11-11 DIAGNOSIS — I272 Pulmonary hypertension, unspecified: Secondary | ICD-10-CM | POA: Diagnosis not present

## 2023-11-11 DIAGNOSIS — D61818 Other pancytopenia: Secondary | ICD-10-CM | POA: Diagnosis not present

## 2023-11-11 DIAGNOSIS — I5042 Chronic combined systolic (congestive) and diastolic (congestive) heart failure: Secondary | ICD-10-CM | POA: Diagnosis not present

## 2023-11-12 ENCOUNTER — Inpatient Hospital Stay

## 2023-11-12 ENCOUNTER — Other Ambulatory Visit: Payer: Self-pay

## 2023-11-12 ENCOUNTER — Emergency Department

## 2023-11-12 ENCOUNTER — Encounter: Payer: Self-pay | Admitting: Emergency Medicine

## 2023-11-12 ENCOUNTER — Emergency Department
Admission: EM | Admit: 2023-11-12 | Discharge: 2023-11-12 | Disposition: A | Discharge status: Home / Self Care | Source: Ambulatory Visit | Attending: Emergency Medicine | Admitting: Emergency Medicine

## 2023-11-12 VITALS — BP 136/53 | HR 37 | Temp 99.1°F | Resp 16

## 2023-11-12 DIAGNOSIS — I2721 Secondary pulmonary arterial hypertension: Secondary | ICD-10-CM | POA: Insufficient documentation

## 2023-11-12 DIAGNOSIS — Z800 Family history of malignant neoplasm of digestive organs: Secondary | ICD-10-CM | POA: Diagnosis not present

## 2023-11-12 DIAGNOSIS — D508 Other iron deficiency anemias: Secondary | ICD-10-CM

## 2023-11-12 DIAGNOSIS — I2510 Atherosclerotic heart disease of native coronary artery without angina pectoris: Secondary | ICD-10-CM | POA: Diagnosis not present

## 2023-11-12 DIAGNOSIS — Z803 Family history of malignant neoplasm of breast: Secondary | ICD-10-CM | POA: Diagnosis not present

## 2023-11-12 DIAGNOSIS — R5383 Other fatigue: Secondary | ICD-10-CM | POA: Diagnosis not present

## 2023-11-12 DIAGNOSIS — R6883 Chills (without fever): Secondary | ICD-10-CM | POA: Diagnosis not present

## 2023-11-12 DIAGNOSIS — N189 Chronic kidney disease, unspecified: Secondary | ICD-10-CM | POA: Diagnosis not present

## 2023-11-12 DIAGNOSIS — N184 Chronic kidney disease, stage 4 (severe): Secondary | ICD-10-CM | POA: Diagnosis not present

## 2023-11-12 DIAGNOSIS — D631 Anemia in chronic kidney disease: Secondary | ICD-10-CM | POA: Diagnosis not present

## 2023-11-12 DIAGNOSIS — Z79899 Other long term (current) drug therapy: Secondary | ICD-10-CM | POA: Diagnosis not present

## 2023-11-12 DIAGNOSIS — I509 Heart failure, unspecified: Secondary | ICD-10-CM | POA: Diagnosis not present

## 2023-11-12 DIAGNOSIS — R0989 Other specified symptoms and signs involving the circulatory and respiratory systems: Secondary | ICD-10-CM | POA: Diagnosis not present

## 2023-11-12 DIAGNOSIS — Z940 Kidney transplant status: Secondary | ICD-10-CM | POA: Diagnosis not present

## 2023-11-12 DIAGNOSIS — J45909 Unspecified asthma, uncomplicated: Secondary | ICD-10-CM | POA: Diagnosis not present

## 2023-11-12 DIAGNOSIS — R531 Weakness: Secondary | ICD-10-CM | POA: Diagnosis not present

## 2023-11-12 DIAGNOSIS — I4891 Unspecified atrial fibrillation: Secondary | ICD-10-CM | POA: Diagnosis not present

## 2023-11-12 DIAGNOSIS — D509 Iron deficiency anemia, unspecified: Secondary | ICD-10-CM | POA: Diagnosis not present

## 2023-11-12 DIAGNOSIS — Z87891 Personal history of nicotine dependence: Secondary | ICD-10-CM | POA: Diagnosis not present

## 2023-11-12 DIAGNOSIS — I130 Hypertensive heart and chronic kidney disease with heart failure and stage 1 through stage 4 chronic kidney disease, or unspecified chronic kidney disease: Secondary | ICD-10-CM | POA: Insufficient documentation

## 2023-11-12 DIAGNOSIS — E119 Type 2 diabetes mellitus without complications: Secondary | ICD-10-CM | POA: Insufficient documentation

## 2023-11-12 LAB — COMPREHENSIVE METABOLIC PANEL WITH GFR
ALT: 18 U/L (ref 0–44)
AST: 22 U/L (ref 15–41)
Albumin: 3.4 g/dL — ABNORMAL LOW (ref 3.5–5.0)
Alkaline Phosphatase: 187 U/L — ABNORMAL HIGH (ref 38–126)
Anion gap: 9 (ref 5–15)
BUN: 59 mg/dL — ABNORMAL HIGH (ref 8–23)
CO2: 27 mmol/L (ref 22–32)
Calcium: 8.6 mg/dL — ABNORMAL LOW (ref 8.9–10.3)
Chloride: 102 mmol/L (ref 98–111)
Creatinine, Ser: 2.04 mg/dL — ABNORMAL HIGH (ref 0.61–1.24)
GFR, Estimated: 36 mL/min — ABNORMAL LOW (ref >60)
Glucose, Bld: 110 mg/dL — ABNORMAL HIGH (ref 70–99)
Potassium: 3.4 mmol/L — ABNORMAL LOW (ref 3.5–5.1)
Sodium: 138 mmol/L (ref 135–145)
Total Bilirubin: 0.5 mg/dL (ref 0.0–1.2)
Total Protein: 6.8 g/dL (ref 6.5–8.1)

## 2023-11-12 LAB — DIFFERENTIAL
Abs Immature Granulocytes: 0.01 K/uL (ref 0.00–0.07)
Basophils Absolute: 0 K/uL (ref 0.0–0.1)
Basophils Relative: 1 %
Eosinophils Absolute: 0 K/uL (ref 0.0–0.5)
Eosinophils Relative: 1 %
Immature Granulocytes: 0 %
Lymphocytes Relative: 26 %
Lymphs Abs: 0.6 K/uL — ABNORMAL LOW (ref 0.7–4.0)
Monocytes Absolute: 0.2 K/uL (ref 0.1–1.0)
Monocytes Relative: 11 %
Neutro Abs: 1.4 K/uL — ABNORMAL LOW (ref 1.7–7.7)
Neutrophils Relative %: 61 %

## 2023-11-12 LAB — CBC WITH DIFFERENTIAL/PLATELET
Abs Immature Granulocytes: 0.01 K/uL (ref 0.00–0.07)
Basophils Absolute: 0 K/uL (ref 0.0–0.1)
Basophils Relative: 1 %
Eosinophils Absolute: 0 K/uL (ref 0.0–0.5)
Eosinophils Relative: 1 %
HCT: 33.6 % — ABNORMAL LOW (ref 39.0–52.0)
Hemoglobin: 10.2 g/dL — ABNORMAL LOW (ref 13.0–17.0)
Immature Granulocytes: 0 %
Lymphocytes Relative: 29 %
Lymphs Abs: 0.8 K/uL (ref 0.7–4.0)
MCH: 29.3 pg (ref 26.0–34.0)
MCHC: 30.4 g/dL (ref 30.0–36.0)
MCV: 96.6 fL (ref 80.0–100.0)
Monocytes Absolute: 0.3 K/uL (ref 0.1–1.0)
Monocytes Relative: 12 %
Neutro Abs: 1.6 K/uL — ABNORMAL LOW (ref 1.7–7.7)
Neutrophils Relative %: 57 %
Platelets: 94 K/uL — ABNORMAL LOW (ref 150–400)
RBC: 3.48 MIL/uL — ABNORMAL LOW (ref 4.22–5.81)
RDW: 15.4 % (ref 11.5–15.5)
WBC: 2.9 K/uL — ABNORMAL LOW (ref 4.0–10.5)
nRBC: 0 % (ref 0.0–0.2)

## 2023-11-12 LAB — CBC
HCT: 33.4 % — ABNORMAL LOW (ref 39.0–52.0)
Hemoglobin: 10 g/dL — ABNORMAL LOW (ref 13.0–17.0)
MCH: 28.7 pg (ref 26.0–34.0)
MCHC: 29.9 g/dL — ABNORMAL LOW (ref 30.0–36.0)
MCV: 95.7 fL (ref 80.0–100.0)
Platelets: 102 K/uL — ABNORMAL LOW (ref 150–400)
RBC: 3.49 MIL/uL — ABNORMAL LOW (ref 4.22–5.81)
RDW: 15.4 % (ref 11.5–15.5)
WBC: 2.8 K/uL — ABNORMAL LOW (ref 4.0–10.5)
nRBC: 0 % (ref 0.0–0.2)

## 2023-11-12 LAB — RESP PANEL BY RT-PCR (RSV, FLU A&B, COVID)  RVPGX2
Influenza A by PCR: NEGATIVE
Influenza B by PCR: NEGATIVE
Resp Syncytial Virus by PCR: NEGATIVE
SARS Coronavirus 2 by RT PCR: NEGATIVE

## 2023-11-12 LAB — TSH: TSH: 2.476 u[IU]/mL (ref 0.350–4.500)

## 2023-11-12 LAB — PROTIME-INR
INR: 1.6 — ABNORMAL HIGH (ref 0.8–1.2)
Prothrombin Time: 20.1 s — ABNORMAL HIGH (ref 11.4–15.2)

## 2023-11-12 LAB — CBC (CANCER CENTER ONLY)
HCT: 31.3 % — ABNORMAL LOW (ref 39.0–52.0)
Hemoglobin: 9.4 g/dL — ABNORMAL LOW (ref 13.0–17.0)
MCH: 28.4 pg (ref 26.0–34.0)
MCHC: 30 g/dL (ref 30.0–36.0)
MCV: 94.6 fL (ref 80.0–100.0)
Platelet Count: 88 K/uL — ABNORMAL LOW (ref 150–400)
RBC: 3.31 MIL/uL — ABNORMAL LOW (ref 4.22–5.81)
RDW: 15.5 % (ref 11.5–15.5)
WBC Count: 2.2 K/uL — ABNORMAL LOW (ref 4.0–10.5)
nRBC: 0 % (ref 0.0–0.2)

## 2023-11-12 LAB — URINALYSIS, ROUTINE W REFLEX MICROSCOPIC
Bilirubin Urine: NEGATIVE
Glucose, UA: 150 mg/dL — AB
Hgb urine dipstick: NEGATIVE
Ketones, ur: NEGATIVE mg/dL
Leukocytes,Ua: NEGATIVE
Nitrite: NEGATIVE
Protein, ur: NEGATIVE mg/dL
Specific Gravity, Urine: 1.012 (ref 1.005–1.030)
pH: 5 (ref 5.0–8.0)

## 2023-11-12 LAB — VITAMIN B12: Vitamin B-12: 351 pg/mL (ref 180–914)

## 2023-11-12 MED ORDER — IRON SUCROSE 20 MG/ML IV SOLN
200.0000 mg | INTRAVENOUS | Status: DC
  Administered 2023-11-12: 200 mg via INTRAVENOUS
  Filled 2023-11-12: qty 10

## 2023-11-12 NOTE — Progress Notes (Signed)
 Patient here for iron  infusion. States that he has been very weak and fatigued with chills for the past several days. CBC, TSH, B12 level checked in clinic today per Dr. Melanee. Patient's WBC count 2.2 and ANC 1400. Pulse fluctuating between 20's and 80's after iron  infusion. History of of Afib. Patient taken to the ED for further evaluation.

## 2023-11-12 NOTE — ED Triage Notes (Signed)
 Presented to [facility] for Iron  infusion.  Patient c/o several days of fatigue.  Labs done, WBC low and low grade fever.  One episode of HR into 20's, BP wnl.  Hx afib.  Patietn AAOx3.  Skin warm and dry. NAD

## 2023-11-12 NOTE — ED Notes (Signed)
 Pt has weakness for 3 days.  Recent falls.  Pt has fx left shoulder per pt.  Pt denies chest pain or sob.   Pt was sent from the cancer center for eval of fatigue.  Pt had iron  infusion today.  Pt alert  speech clear.

## 2023-11-12 NOTE — ED Triage Notes (Signed)
 Patient to ED from the cancer center- being seen today for iron  infusion. States he has been feeling weak and having chills for the past few days. Labs completed at [facility]- WBC 2.2.Hx afib- [facility] states 1 episode of HR in the 20's.   Has a broken left shoulder per pt.

## 2023-11-12 NOTE — ED Provider Triage Note (Signed)
 Emergency Medicine Provider Triage Evaluation Note  [name]. , a 63 y.o. male  was evaluated in triage.  Pt complains of weakness.  Patient was seen today at the cancer center for iron  infusion.  Patient states feeling weak, with chills..  Review of Systems  Positive:  Negative:   Physical Exam  BP 126/78 (BP Location: Right Arm)   Pulse 88   Temp 99.4 F (37.4 C) (Oral)   Resp 18   Ht 5' 10 (1.778 m)   Wt 107.5 kg   SpO2 95%   BMI 34.01 kg/m during triage vital signs are normal, patient is afebrile Gen:   Awake, no distress  Resp:  Normal effort MSK:   Moves extremities without difficulty  Other:    Medical Decision Making  Medically screening exam initiated at 4:03 PM.  Appropriate orders placed.  Carl Geary [name]. was informed that the remainder of the evaluation will be completed by another provider, this initial triage assessment does not replace that evaluation, and the importance of remaining in the ED until their evaluation is complete. Patient was referred from cancer Center for weakness, chills, order EKG pro time/INR, EKG.   CBC CMP    Janit Kast, PA-C 11/12/23 1604

## 2023-11-12 NOTE — ED Provider Notes (Signed)
 [facility] Provider Note    Event Date/Time   First MD Initiated Contact with Patient 11/12/23 1721     (approximate)   History   Weakness   HPI  [name]. is a 63 y.o. male with a history of diabetes, heart failure, asthma, hypertension, CAD, CKD, atrial fibrillation, pulmonary artery hypertension, anemia who presents from the cancer center after having iron  infusion because he has been having diffuse weakness fatigue, chills over the last 2 to 3 days.  No cough, no dysuria, no abdominal pain, no chest pain, no rash     Physical Exam   Triage Vital Signs: ED Triage Vitals  Encounter Vitals Group     BP 11/12/23 1554 126/78     Girls Systolic BP Percentile --      Girls Diastolic BP Percentile --      Boys Systolic BP Percentile --      Boys Diastolic BP Percentile --      Pulse Rate 11/12/23 1554 88     Resp 11/12/23 1559 18     Temp 11/12/23 1554 99.4 F (37.4 C)     Temp Source 11/12/23 1554 Oral     SpO2 11/12/23 1554 95 %     Weight 11/12/23 1559 107.5 kg (237 lb)     Height 11/12/23 1559 1.778 m (5' 10)     Head Circumference --      Peak Flow --      Pain Score 11/12/23 1559 6     Pain Loc --      Pain Education --      Exclude from Growth Chart --     Most recent vital signs: Vitals:   11/12/23 1935 11/12/23 2000  BP: (!) 121/55 (!) 132/48  Pulse: 88 (!) 44  Resp: 18 19  Temp: 98.8 F (37.1 C)   SpO2: 99% 95%     General: Awake, no distress.  CV:  Good peripheral perfusion.  Resp:  Normal effort.  Abd:  No distention.  Other:     ED Results / Procedures / Treatments   Labs (all labs ordered are listed, but only abnormal results are displayed) Labs Reviewed  COMPREHENSIVE METABOLIC PANEL WITH GFR - Abnormal; Notable for the following components:      Result Value   Potassium 3.4 (*)    Glucose, Bld 110 (*)    BUN 59 (*)    Creatinine, Ser 2.04 (*)    Calcium  8.6 (*)    Albumin  3.4 (*)     Alkaline Phosphatase 187 (*)    GFR, Estimated 36 (*)    All other components within normal limits  CBC - Abnormal; Notable for the following components:   WBC 2.8 (*)    RBC 3.49 (*)    Hemoglobin 10.0 (*)    HCT 33.4 (*)    MCHC 29.9 (*)    Platelets 102 (*)    All other components within normal limits  URINALYSIS, ROUTINE W REFLEX MICROSCOPIC - Abnormal; Notable for the following components:   Color, Urine YELLOW (*)    APPearance HAZY (*)    Glucose, UA 150 (*)    All other components within normal limits  PROTIME-INR - Abnormal; Notable for the following components:   Prothrombin Time 20.1 (*)    INR 1.6 (*)    All other components within normal limits  CBC WITH DIFFERENTIAL/PLATELET - Abnormal; Notable for the following components:   WBC 2.9 (*)  RBC 3.48 (*)    Hemoglobin 10.2 (*)    HCT 33.6 (*)    Platelets 94 (*)    Neutro Abs 1.6 (*)    All other components within normal limits  RESP PANEL BY RT-PCR (RSV, FLU A&B, COVID)  [id number]  CULTURE, BLOOD (ROUTINE X 2)  CULTURE, BLOOD (ROUTINE X 2)  CBG MONITORING, ED     EKG  ED ECG REPORT I, Lamar Price, the attending physician, personally viewed and interpreted this ECG.  Date: 11/12/2023  Rhythm: Atrial ibrillation QRS Axis: normal Intervals: Abnormal ST/T Wave abnormalities: normal Narrative Interpretation: PVCs Compared to prior EKGs, no significant changes   RADIOLOGY Chest x-ray view interpret by me, no pneumonia    PROCEDURES:  Critical Care performed:   Procedures   MEDICATIONS ORDERED IN ED: Medications - No data to display   IMPRESSION / MDM / ASSESSMENT AND PLAN / ED COURSE  I reviewed the triage vital signs and the nursing notes. Patient's presentation is most consistent with acute presentation with potential threat to life or bodily function.  Patient presents with weakness as detailed above, temperature here is 99.4 but otherwise vitals are reassuring.  Inferential includes  viral versus bacterial infectious etiology, dehydration, kidney disease  Will check COVID, flu, RSV, obtain chest x-ray, urinalysis  Lab work here seems improved from 2 hours ago, white blood cell count is 2.8, will add differential, kidney function improved over the last month  ANC improved  COVID flu negative, chest x-ray without evidence of pneumonia, urinalysis is reassuring  Discussed admission with the patient however he states that he does not want to be admitted and would prefer to have blood cultures and be discharged, he can return if any growth, he and his wife both agree with discharge at this time      FINAL CLINICAL IMPRESSION(S) / ED DIAGNOSES   Final diagnoses:  Generalized weakness     Rx / DC Orders   ED Discharge Orders     None        Note:  This document was prepared using Dragon voice recognition software and may include unintentional dictation errors.   Price Lamar, MD 11/12/23 2129

## 2023-11-13 DIAGNOSIS — R29898 Other symptoms and signs involving the musculoskeletal system: Secondary | ICD-10-CM | POA: Diagnosis not present

## 2023-11-13 DIAGNOSIS — M62512 Muscle wasting and atrophy, not elsewhere classified, left shoulder: Secondary | ICD-10-CM | POA: Diagnosis not present

## 2023-11-13 DIAGNOSIS — S42215D Unspecified nondisplaced fracture of surgical neck of left humerus, subsequent encounter for fracture with routine healing: Secondary | ICD-10-CM | POA: Diagnosis not present

## 2023-11-14 DIAGNOSIS — I272 Pulmonary hypertension, unspecified: Secondary | ICD-10-CM | POA: Diagnosis not present

## 2023-11-14 DIAGNOSIS — E662 Morbid (severe) obesity with alveolar hypoventilation: Secondary | ICD-10-CM | POA: Diagnosis not present

## 2023-11-14 DIAGNOSIS — D696 Thrombocytopenia, unspecified: Secondary | ICD-10-CM | POA: Diagnosis not present

## 2023-11-14 DIAGNOSIS — I700 Atherosclerosis of aorta: Secondary | ICD-10-CM | POA: Diagnosis not present

## 2023-11-14 DIAGNOSIS — I110 Hypertensive heart disease with heart failure: Secondary | ICD-10-CM | POA: Diagnosis not present

## 2023-11-14 DIAGNOSIS — D61818 Other pancytopenia: Secondary | ICD-10-CM | POA: Diagnosis not present

## 2023-11-14 DIAGNOSIS — E119 Type 2 diabetes mellitus without complications: Secondary | ICD-10-CM | POA: Diagnosis not present

## 2023-11-14 DIAGNOSIS — I4821 Permanent atrial fibrillation: Secondary | ICD-10-CM | POA: Diagnosis not present

## 2023-11-14 DIAGNOSIS — I5042 Chronic combined systolic (congestive) and diastolic (congestive) heart failure: Secondary | ICD-10-CM | POA: Diagnosis not present

## 2023-11-14 DIAGNOSIS — S4292XD Fracture of left shoulder girdle, part unspecified, subsequent encounter for fracture with routine healing: Secondary | ICD-10-CM | POA: Diagnosis not present

## 2023-11-17 ENCOUNTER — Other Ambulatory Visit: Payer: Self-pay

## 2023-11-17 DIAGNOSIS — E662 Morbid (severe) obesity with alveolar hypoventilation: Secondary | ICD-10-CM | POA: Diagnosis not present

## 2023-11-17 DIAGNOSIS — I4821 Permanent atrial fibrillation: Secondary | ICD-10-CM | POA: Diagnosis not present

## 2023-11-17 DIAGNOSIS — I700 Atherosclerosis of aorta: Secondary | ICD-10-CM | POA: Diagnosis not present

## 2023-11-17 DIAGNOSIS — D696 Thrombocytopenia, unspecified: Secondary | ICD-10-CM | POA: Diagnosis not present

## 2023-11-17 DIAGNOSIS — D61818 Other pancytopenia: Secondary | ICD-10-CM | POA: Diagnosis not present

## 2023-11-17 DIAGNOSIS — I110 Hypertensive heart disease with heart failure: Secondary | ICD-10-CM | POA: Diagnosis not present

## 2023-11-17 DIAGNOSIS — I5042 Chronic combined systolic (congestive) and diastolic (congestive) heart failure: Secondary | ICD-10-CM | POA: Diagnosis not present

## 2023-11-17 DIAGNOSIS — I272 Pulmonary hypertension, unspecified: Secondary | ICD-10-CM | POA: Diagnosis not present

## 2023-11-17 DIAGNOSIS — S4292XD Fracture of left shoulder girdle, part unspecified, subsequent encounter for fracture with routine healing: Secondary | ICD-10-CM | POA: Diagnosis not present

## 2023-11-17 DIAGNOSIS — E119 Type 2 diabetes mellitus without complications: Secondary | ICD-10-CM | POA: Diagnosis not present

## 2023-11-17 LAB — CULTURE, BLOOD (ROUTINE X 2)
Culture: NO GROWTH
Culture: NO GROWTH
Special Requests: ADEQUATE
Special Requests: ADEQUATE

## 2023-11-19 DIAGNOSIS — I5042 Chronic combined systolic (congestive) and diastolic (congestive) heart failure: Secondary | ICD-10-CM | POA: Diagnosis not present

## 2023-11-19 DIAGNOSIS — I110 Hypertensive heart disease with heart failure: Secondary | ICD-10-CM | POA: Diagnosis not present

## 2023-11-19 DIAGNOSIS — D61818 Other pancytopenia: Secondary | ICD-10-CM | POA: Diagnosis not present

## 2023-11-19 DIAGNOSIS — I700 Atherosclerosis of aorta: Secondary | ICD-10-CM | POA: Diagnosis not present

## 2023-11-19 DIAGNOSIS — E662 Morbid (severe) obesity with alveolar hypoventilation: Secondary | ICD-10-CM | POA: Diagnosis not present

## 2023-11-19 DIAGNOSIS — I272 Pulmonary hypertension, unspecified: Secondary | ICD-10-CM | POA: Diagnosis not present

## 2023-11-19 DIAGNOSIS — M25512 Pain in left shoulder: Secondary | ICD-10-CM | POA: Diagnosis not present

## 2023-11-19 DIAGNOSIS — S4292XD Fracture of left shoulder girdle, part unspecified, subsequent encounter for fracture with routine healing: Secondary | ICD-10-CM | POA: Diagnosis not present

## 2023-11-19 DIAGNOSIS — I4821 Permanent atrial fibrillation: Secondary | ICD-10-CM | POA: Diagnosis not present

## 2023-11-19 DIAGNOSIS — D696 Thrombocytopenia, unspecified: Secondary | ICD-10-CM | POA: Diagnosis not present

## 2023-11-19 DIAGNOSIS — E119 Type 2 diabetes mellitus without complications: Secondary | ICD-10-CM | POA: Diagnosis not present

## 2023-11-19 NOTE — Progress Notes (Signed)
 Anesthesia Chart Review    Case: [id number] Date/Time: 11/20/23 0715   Procedures:      ABLATION, CONDYLOMA, USING LASER - LASER REMOVAL ABLATION OF CONDYLOMATA ANORECTAL EXAMINATION UNDER ANESTHESIA     EXAM UNDER ANESTHESIA, RECTUM   Anesthesia type: General   Diagnosis: Anal condyloma [A63.0]   Pre-op diagnosis: CONDYLOMA ACUMINATUM OF PERIANAL  AND  ANAL CANAL REGION   Location: [facility]OM 01 / [facility]   Surgeons: Sheldon Standing, MD       DISCUSSION:63 y.o. never smoker with h/o gastric bypass, HTN, asthma, OSA treated with BiPAP, CKD Stage IV, s/p right renal transplant 2012 (on tacrolimus  and mycophenolate ), atrial fibrillation on Eliquis , CAD s/p CABG 2005, CHF, PAH, AS with mean gradient 8.3 mmHg, valve area 2.09 cm2 on Echo 03/25/2023, insulin  dependent DM II, BPH, perianal condyloma scheduled for above procedure 11/20/2023 with Dr. Standing Sheldon.   S/p gastric bypass 2015.   Pt with anemia of CKD, iron  deficiency anemia. Receives iron  infusions with oncology.   Pt last seen by cardiology 10/24/2023. Per OV note pt doing well overall.   Per preoperative evaluation, - Revised Cardiac Risk Index for Pre-Operative Risk Score: 4. 15% risk of major cardiac event - Discussed with Dr. Rolan. Ok to hold Eliquis  for 2 days prior to procedure. Will need to hold Farxiga  3 days prior. Will need to hold Mounjaro  1 week before procedure.   - Cardiolite (12/22): small apical inferior defect, no changes from prior, no ischemia. Low risk study. - Echo 7/25: EF 40-45%, RV not well visualized   Pt last seen by PCP 11/04/2023. Stable at this visit.   S/p ablation of condyloma 01/08/2023, no anesthesia complications noted.  ASA 4. Assess volume status DOS.   VS: BP 132/62   Pulse 75   Temp 36.7 C (Oral)   Resp 16   Ht 5' 9 (1.753 m)   Wt 107.5 kg   SpO2 97%   BMI 35.00 kg/m   PROVIDERS: [name], MD is PCP  Cardiologist:  Dr. Darron Nephrology: Dr. Joshua  HF Cardiologist: Dr.  Rolan  LABS: Labs reviewed: Acceptable for surgery. (all labs ordered are listed, but only abnormal results are displayed)  Labs Reviewed  BASIC METABOLIC PANEL WITH GFR - Abnormal; Notable for the following components:      Result Value   Potassium 3.2 (*)    Glucose, Bld 105 (*)    BUN 73 (*)    Creatinine, Ser 2.42 (*)    Calcium  8.8 (*)    GFR, Estimated 29 (*)    All other components within normal limits  CBC - Abnormal; Notable for the following components:   WBC 2.8 (*)    RBC 3.58 (*)    Hemoglobin 10.2 (*)    HCT 35.2 (*)    MCHC 29.0 (*)    RDW 15.6 (*)    Platelets 100 (*)    All other components within normal limits  HEMOGLOBIN A1C     IMAGES:   EKG:   CV: Echo 09/26/2023 1. Left ventricular ejection fraction, by estimation, is 40 to 45%. The  left ventricle has mildly decreased function. The left ventricle  demonstrates global hypokinesis. The left ventricular internal cavity size  was mildly dilated. Left ventricular  diastolic function could not be evaluated.   2. Right ventricular systolic function was not well visualized. The right  ventricular size is not well visualized.   3. Left atrial size was moderately dilated.  4. Right atrial size was mild to moderately dilated.   5. The mitral valve was not well visualized. No evidence of mitral valve  regurgitation.   6. The aortic valve was not well visualized. Aortic valve regurgitation  is not visualized.  Past Medical History:  Diagnosis Date   Anal condyloma    Anemia    Anxiety    Aortic atherosclerosis (HCC)    Aortic stenosis    a.) TTE 10/2021: Mod AS. AVA 1.06cm^2 (VTI). Mean grad ; b.) TTE 04/02/2022: no AV stenosis   Asthma, persistent controlled 02/25/2013   Attention or concentration deficit 04/26/2021   BPH with obstruction/lower urinary tract symptoms 09/25/2014   CAD (coronary artery disease) 2005   a.) LHC 1996 -> HG stenosis mLAD -> PTCA/PCI with stent x 1 (unk type) ->  complicated by CFA pseudoanurysm (required surg);  b.) LHC 08/10/2002  -> 95% LAD, 70% ISR LAD, 80% pOM, 70% mRCA --> CVTS consult; c.) 4v CABG 08/13/2002; c.) 03/2018 MV: Small, fixed inferoapical and apical sep defect. No isc. EF 47% (60-65% by 05/2018 TTE); d.) 02/2021 MV: small Apical inf fixed defect. No isc -> low risk.   Cardiomegaly    Cellulitis and abscess of left leg 07/22/2020   Chronic atrial fibrillation    a.) CHA2DS2VASc = 4 (CHF, HTN, vascular disease history, T2DM);  b.) rate/rhythm maintained without pharmacological intervention; chronically anticoagulated with apixaban    Chronic combined systolic and diastolic CHF (congestive heart failure)    a.) TTE 7/18 : EF 45-50%; b.) TTE 05/2018 : EF 60-65%, RVSP 74.8; c.) TTE 12/2018: EF 50-55%. Sev dil LA; d.) TTE 04/2019: EF 45-50%; e.) TTE 07/2021: EF 40-45%, G3DD; f.) TTE 10/2021: EF 40-45%, mild conc LVH, septal-lat dyssynchrony (LBBB), mildly red RVSF, mild-mod dil LA, mildly dil RA, mild-mod MS, mod AS; g.) TTE 04/02/2022: EF 40-45%, glob HK, LVH, red RVSF, RVE, sev LAE, mild-mod RAE, mild MR   Chronic venous stasis dermatitis of both lower extremities    Cytomegaloviral disease 2017   Diverticulosis of colon 04/22/2013   Elevated RIGHT hemidiaphragm    Erectile dysfunction    a.) on topical TRT + Trimix (alprostadil/papaverine/phentolamine) injections   ESRD (end stage renal disease) (HCC)    a.) dialysis dependent 2004-2012; b.) s/p cadaveric RIGHT renal transplant 09/13/2010   Essential hypertension 12/17/2010   Gout    History of 2019 novel coronavirus disease (COVID-19) 06/30/2019   History of bilateral cataract extraction 10/2017   History of renal transplant 09/13/2010   a.) s/p cadaveric donor transplant 09/13/2010   Hx of bilateral cataract extraction 10/2017   Hyperlipidemia    Hypogonadism in male 09/25/2014   Ischemic cardiomyopathy    Left cephalic vein thrombosis 06/2019   Long term current use of anticoagulant     a.) apixaban    Long term current use of immunosuppressive drug    a.) mycophenolate  + prednisone  + tacrolimus    Major depressive disorder 10/04/2013   Mitral stenosis 11/07/2021   a.) TTE 11/07/2021: severe MAC, mild-mod MS (MPG 6 mmHg); b.) TTE 04/02/2022: no MV stenosis   Murmur    Obesity hypoventilation syndrome 03/31/2012   OSA treated with BiPAP 09/29/2012   PAH (pulmonary artery hypertension)    a. 04/2019 RHC: RA 19, RV 80/20, PA 78/31 (51), PCWP 25, CO/CI 7.69/3.26. PVR 3.25 --> Sev PAH, likely primarily PV HTN; b.) RHC 04/04/2022: mRA 13, mPA 40, mPCWP 20, PA sat 59, AO sat 92, CO 7.63, CI 3.26, PVR 2.6 --> mod  portal venous HTN   Pancreatitis    S/P CABG x 4 08/13/2002   a.) LIMA-LAD, LRA-OM, SVG-D1, SVG-dRCA   S/P gastric bypass    Synovitis of finger 06/11/2019   Trigger finger, unspecified little finger 12/23/2018   Type 2 diabetes mellitus with hyperglycemia, with long-term current use of insulin  09/09/2014   a.) has FreeStyle Libre CGM   Ulcer    Left shin goes to wound care center at [facility] qweek   Wears glasses    Wears hearing aid in both ears     Past Surgical History:  Procedure Laterality Date   CARDIAC CATHETERIZATION N/A 08/10/2002   CATARACT EXTRACTION W/PHACO Right 10/29/2017   Procedure: CATARACT EXTRACTION PHACO AND INTRAOCULAR LENS PLACEMENT (IOC)  RIGHT DIABETIC;  Surgeon: Mittie Gaskin, MD;  Location: [facility];  Service: Ophthalmology;  Laterality: Right   CATARACT EXTRACTION W/PHACO Left 11/18/2017   Procedure: CATARACT EXTRACTION PHACO AND INTRAOCULAR LENS PLACEMENT (IOC) LEFT IVA/TOPICAL;  Surgeon: Mittie Gaskin, MD;  Location: [facility];  Service: Ophthalmology;  Laterality: Left   COLONOSCOPY WITH PROPOFOL  N/A 04/22/2013   Procedure: COLONOSCOPY WITH PROPOFOL ;  Surgeon: Toribio SHAUNNA Cedar, MD;  Location: [facility];  Service: Endoscopy;  Laterality: N/A;   CORONARY ANGIOPLASTY WITH STENT PLACEMENT N/A 1996    CORONARY ARTERY BYPASS GRAFT N/A 08/13/2002   Procedure: 4v CORONARY ARTERY BYPASS GRAFTING; Location: [facility]; Surgeon: Dessa Laine, MD   CYSTOSCOPY WITH INSERTION OF UROLIFT N/A 12/25/2021   Procedure: CYSTOSCOPY WITH INSERTION OF UROLIFT;  Surgeon: Twylla Glendia BROCKS, MD;  Location: [facility];  Service: Urology;  Laterality: N/A;   DG ANGIO AV SHUNT*L*     right and left upper arms   FASCIOTOMY  03/03/2012   Procedure: FASCIOTOMY;  Surgeon: Arley JONELLE Curia, MD;  Location: [facility];  Service: Orthopedics;  Laterality: Right;  FASCIOTOMY RIGHT SMALL FINGER   FASCIOTOMY Left 08/17/2013   Procedure: FASCIOTOMY LEFT RING;  Surgeon: Arley JONELLE Curia, MD;  Location: [facility];  Service: Orthopedics;  Laterality: Left;   INCISION AND DRAINAGE ABSCESS Left 10/15/2015   Procedure: INCISION AND DRAINAGE ABSCESS;  Surgeon: Laneta JULIANNA Luna, MD;  Location: [facility];  Service: General;  Laterality: Left;   KIDNEY TRANSPLANT  09/13/2010   cadaver--at [facility]   LASER ABLATION CONDOLAMATA N/A 01/08/2023   Procedure: LASER REMOVAL ABLATION OF CONDYLOMATA;  Surgeon: Sheldon Standing, MD;  Location: [facility];  Service: General;  Laterality: N/A;  GEN AND LOCAL   RECTAL EXAM UNDER ANESTHESIA N/A 01/08/2023   Procedure: ANORECTAL EXAM UNDER ANESTHESIA;  Surgeon: Sheldon Standing, MD;  Location: [facility];  Service: General;  Laterality: N/A;   RIGHT HEART CATH N/A 11/15/2016   Procedure: RIGHT HEART CATH;  Surgeon: Darron Deatrice LABOR, MD;  Location: [facility];  Service: Cardiovascular;  Laterality: N/A;   RIGHT HEART CATH N/A 05/03/2019   Procedure: RIGHT HEART CATH;  Surgeon: [name], MD;  Location: [facility];  Service: Cardiovascular;  Laterality: N/A;   RIGHT HEART CATH N/A 04/04/2022   Procedure: RIGHT HEART CATH;  Surgeon: [name], MD;  Location: [facility] INVASIVE CV LAB;  Service: Cardiovascular;  Laterality: N/A;   ROUX-EN-Y GASTRIC BYPASS N/A 2015   TYMPANIC  MEMBRANE REPAIR Left 03/2010   VASECTOMY     WART FULGURATION N/A 05/31/2022   Procedure: FULGURATION ANAL WART;  Surgeon: Tye Millet, DO;  Location: [facility];  Service: General;  Laterality: N/A;  MEDICATIONS:  albuterol  (PROVENTIL ) (2.5 MG/3ML) 0.083% nebulizer solution   albuterol  (VENTOLIN  HFA) 108 (90 Base) MCG/ACT inhaler   apixaban  (ELIQUIS ) 5 MG TABS tablet   buPROPion  (WELLBUTRIN  SR) 150 MG 12 hr tablet   colchicine  0.6 MG tablet   Continuous Glucose Sensor (FREESTYLE LIBRE 3 SENSOR) MISC   Cyanocobalamin  (B-12 PO)   dapagliflozin  propanediol (FARXIGA ) 10 MG TABS tablet   diazepam  (VALIUM ) 5 MG tablet   diazepam  (VALIUM ) 5 MG tablet   febuxostat  (ULORIC ) 40 MG tablet   fluticasone  (FLONASE ) 50 MCG/ACT nasal spray   gabapentin  (NEURONTIN ) 300 MG capsule   HYDROcodone -acetaminophen  (NORCO/VICODIN) 5-325 MG tablet   insulin  glargine-yfgn (SEMGLEE ) 100 UNIT/ML Pen   Insulin  Pen Needle (TECHLITE PEN NEEDLES) 31G X 5 MM MISC   Insulin  Pen Needle (UNIFINE PENTIPS) 31G X 5 MM MISC   losartan  (COZAAR ) 25 MG tablet   metolazone  (ZAROXOLYN ) 2.5 MG tablet   montelukast  (SINGULAIR ) 10 MG tablet   mycophenolate  (MYFORTIC ) 180 MG EC tablet   omeprazole  (PRILOSEC) 20 MG capsule   potassium chloride  SA (KLOR-CON  M) 20 MEQ tablet   predniSONE  (DELTASONE ) 5 MG tablet   rosuvastatin  (CRESTOR ) 10 MG tablet   silver  sulfADIAZINE  (SILVADENE ) 1 % cream   sodium chloride  (OCEAN) 0.65 % SOLN nasal spray   sulfamethoxazole -trimethoprim  (BACTRIM ) 400-80 MG tablet   tacrolimus  (PROGRAF ) 1 MG capsule   tadalafil  (CIALIS ) 20 MG tablet   tadalafil  (CIALIS ) 5 MG tablet   tamsulosin  (FLOMAX ) 0.4 MG CAPS capsule   Testosterone  20.25 MG/ACT (1.62%) GEL   tirzepatide  (MOUNJARO ) 5 MG/0.5ML Pen   tiZANidine  (ZANAFLEX ) 2 MG tablet   torsemide  (DEMADEX ) 100 MG tablet   Zinc  50 MG TABS   No current facility-administered medications for this encounter.    Harlene Hoots Ward, PA-C [facility]  [facility] [phone]

## 2023-11-19 NOTE — Anesthesia Preprocedure Evaluation (Addendum)
 Anesthesia Evaluation  Patient identified by MRN, date of birth, ID band Patient awake    Reviewed: Allergy & Precautions, NPO status , Patient's Chart, lab work & pertinent test results  History of Anesthesia Complications Negative for: history of anesthetic complications  Airway Mallampati: III  TM Distance: >3 FB Neck ROM: Full   Comment: Previous grade III view with MAC 4, easy mask with OPA Dental  (+) Dental Advisory Given   Pulmonary neg shortness of breath, asthma (no recent flares, acts up in the spring) , sleep apnea and Continuous Positive Airway Pressure Ventilation , neg COPD, neg recent URI Obesity hypoventilation syndrome   Pulmonary exam normal breath sounds clear to auscultation       Cardiovascular hypertension (losartan , metolazone ), Pt. on medications pulmonary hypertension(-) angina + CAD, + Cardiac Stents, + CABG (2004), +CHF and + DVT  + dysrhythmias Atrial Fibrillation + Valvular Problems/Murmurs  Rhythm:Regular Rate:Normal  HLD  TTE 09/27/2023: IMPRESSIONS    1. Left ventricular ejection fraction, by estimation, is 40 to 45%. The  left ventricle has mildly decreased function. The left ventricle  demonstrates global hypokinesis. The left ventricular internal cavity size  was mildly dilated. Left ventricular  diastolic function could not be evaluated.   2. Right ventricular systolic function was not well visualized. The right  ventricular size is not well visualized.   3. Left atrial size was moderately dilated.   4. Right atrial size was mild to moderately dilated.   5. The mitral valve was not well visualized. No evidence of mitral valve  regurgitation.   6. The aortic valve was not well visualized. Aortic valve regurgitation  is not visualized.   RHC 04/04/2022: 1. Elevated filling pressures.  2. Moderate primarily pulmonary venous hypertension.  3. Preserved cardiac output.     Neuro/Psych neg  Seizures PSYCHIATRIC DISORDERS Anxiety Depression     Neuromuscular disease (h/o Bell's palsy with chronic left facial droop)    GI/Hepatic Neg liver ROS,GERD  Medicated,,Diverticulosis, s/p gastric bypass   Endo/Other  diabetes, Type 2, Insulin  Dependent    Renal/GU Renal disease (s/p renal transplant 09/13/2010)     Musculoskeletal  (+) Arthritis ,    Abdominal   Peds  Hematology  (+) Blood dyscrasia (thrombocytopenia), anemia Lab Results      Component                Value               Date                      WBC                      2.8 (L)             11/12/2023                WBC                      2.9 (L)             11/12/2023                HGB                      10.0 (L)            11/12/2023  HGB                      10.2 (L)            11/12/2023                HCT                      33.4 (L)            11/12/2023                HCT                      33.6 (L)            11/12/2023                MCV                      95.7                11/12/2023                MCV                      96.6                11/12/2023                PLT                      102 (L)             11/12/2023                PLT                      94 (L)              11/12/2023              Anesthesia Other Findings 8/20 - weakness, fatigue in the ED. States it was his son's wedding and that he may have overdone it. Has been feeling well since.  Fistula on the left side. Broken shoulder on the left side.  Last Eliquis : 11/16/2023  Last Farxiga : 11/16/2023  Last Mounjaro : 11/10/2023  Reproductive/Obstetrics                              Anesthesia Physical Anesthesia Plan  ASA: 4  Anesthesia Plan: General   Post-op Pain Management: Tylenol  PO (pre-op)*   Induction: Intravenous  PONV Risk Score and Plan: 2 and Ondansetron , Dexamethasone , Treatment may vary due to age or medical condition and Midazolam   Airway Management  Planned: Oral ETT  Additional Equipment:   Intra-op Plan:   Post-operative Plan: Extubation in OR  Informed Consent: I have reviewed the patients History and Physical, chart, labs and discussed the procedure including the risks, benefits and alternatives for the proposed anesthesia with the patient or authorized representative who has indicated his/her understanding and acceptance.     Dental advisory given  Plan Discussed with: CRNA and Anesthesiologist  Anesthesia Plan Comments: (See PAT note 11/06/2023  Risks of general anesthesia discussed including, but not limited to, sore throat, hoarse voice, chipped/damaged teeth, injury to vocal cords, nausea and vomiting, allergic reactions,  lung infection, heart attack, stroke, and death. All questions answered. )         Anesthesia Quick Evaluation

## 2023-11-20 ENCOUNTER — Encounter: Payer: Self-pay | Admitting: Oncology

## 2023-11-20 ENCOUNTER — Ambulatory Visit (HOSPITAL_COMMUNITY): Admission: RE | Admit: 2023-11-20 | Source: Home / Self Care | Admitting: Surgery

## 2023-11-20 ENCOUNTER — Other Ambulatory Visit: Payer: Self-pay

## 2023-11-20 ENCOUNTER — Encounter (HOSPITAL_COMMUNITY)
Admission: RE | Disposition: A | Discharge status: Home / Self Care | Payer: Self-pay | Source: Home / Self Care | Attending: Surgery

## 2023-11-20 ENCOUNTER — Other Ambulatory Visit: Payer: Self-pay | Admitting: Urology

## 2023-11-20 ENCOUNTER — Encounter (HOSPITAL_COMMUNITY): Payer: Self-pay | Admitting: Surgery

## 2023-11-20 ENCOUNTER — Ambulatory Visit (HOSPITAL_COMMUNITY): Payer: Self-pay | Admitting: Anesthesiology

## 2023-11-20 ENCOUNTER — Ambulatory Visit (HOSPITAL_COMMUNITY)
Admission: RE | Admit: 2023-11-20 | Discharge: 2023-11-20 | Disposition: A | Discharge status: Home / Self Care | Attending: Surgery | Admitting: Surgery

## 2023-11-20 ENCOUNTER — Ambulatory Visit (HOSPITAL_COMMUNITY): Payer: Self-pay | Admitting: Medical

## 2023-11-20 DIAGNOSIS — I4819 Other persistent atrial fibrillation: Secondary | ICD-10-CM | POA: Diagnosis not present

## 2023-11-20 DIAGNOSIS — I2510 Atherosclerotic heart disease of native coronary artery without angina pectoris: Secondary | ICD-10-CM | POA: Insufficient documentation

## 2023-11-20 DIAGNOSIS — M1990 Unspecified osteoarthritis, unspecified site: Secondary | ICD-10-CM | POA: Insufficient documentation

## 2023-11-20 DIAGNOSIS — K641 Second degree hemorrhoids: Secondary | ICD-10-CM | POA: Insufficient documentation

## 2023-11-20 DIAGNOSIS — D849 Immunodeficiency, unspecified: Secondary | ICD-10-CM | POA: Diagnosis not present

## 2023-11-20 DIAGNOSIS — K219 Gastro-esophageal reflux disease without esophagitis: Secondary | ICD-10-CM | POA: Diagnosis not present

## 2023-11-20 DIAGNOSIS — I5022 Chronic systolic (congestive) heart failure: Secondary | ICD-10-CM

## 2023-11-20 DIAGNOSIS — E662 Morbid (severe) obesity with alveolar hypoventilation: Secondary | ICD-10-CM | POA: Insufficient documentation

## 2023-11-20 DIAGNOSIS — Z955 Presence of coronary angioplasty implant and graft: Secondary | ICD-10-CM | POA: Diagnosis not present

## 2023-11-20 DIAGNOSIS — I272 Pulmonary hypertension, unspecified: Secondary | ICD-10-CM | POA: Diagnosis not present

## 2023-11-20 DIAGNOSIS — K642 Third degree hemorrhoids: Secondary | ICD-10-CM | POA: Insufficient documentation

## 2023-11-20 DIAGNOSIS — Z951 Presence of aortocoronary bypass graft: Secondary | ICD-10-CM | POA: Diagnosis not present

## 2023-11-20 DIAGNOSIS — J45909 Unspecified asthma, uncomplicated: Secondary | ICD-10-CM | POA: Diagnosis not present

## 2023-11-20 DIAGNOSIS — Z794 Long term (current) use of insulin: Secondary | ICD-10-CM | POA: Diagnosis not present

## 2023-11-20 DIAGNOSIS — G4733 Obstructive sleep apnea (adult) (pediatric): Secondary | ICD-10-CM | POA: Insufficient documentation

## 2023-11-20 DIAGNOSIS — Z7985 Long-term (current) use of injectable non-insulin antidiabetic drugs: Secondary | ICD-10-CM | POA: Insufficient documentation

## 2023-11-20 DIAGNOSIS — K6282 Dysplasia of anus: Secondary | ICD-10-CM | POA: Insufficient documentation

## 2023-11-20 DIAGNOSIS — Z8249 Family history of ischemic heart disease and other diseases of the circulatory system: Secondary | ICD-10-CM | POA: Insufficient documentation

## 2023-11-20 DIAGNOSIS — F418 Other specified anxiety disorders: Secondary | ICD-10-CM | POA: Diagnosis not present

## 2023-11-20 DIAGNOSIS — I132 Hypertensive heart and chronic kidney disease with heart failure and with stage 5 chronic kidney disease, or end stage renal disease: Secondary | ICD-10-CM | POA: Diagnosis not present

## 2023-11-20 DIAGNOSIS — I110 Hypertensive heart disease with heart failure: Secondary | ICD-10-CM | POA: Diagnosis not present

## 2023-11-20 DIAGNOSIS — E1165 Type 2 diabetes mellitus with hyperglycemia: Secondary | ICD-10-CM

## 2023-11-20 DIAGNOSIS — N189 Chronic kidney disease, unspecified: Secondary | ICD-10-CM | POA: Diagnosis not present

## 2023-11-20 DIAGNOSIS — Z7984 Long term (current) use of oral hypoglycemic drugs: Secondary | ICD-10-CM | POA: Insufficient documentation

## 2023-11-20 DIAGNOSIS — A630 Anogenital (venereal) warts: Secondary | ICD-10-CM

## 2023-11-20 DIAGNOSIS — D696 Thrombocytopenia, unspecified: Secondary | ICD-10-CM | POA: Insufficient documentation

## 2023-11-20 DIAGNOSIS — Z940 Kidney transplant status: Secondary | ICD-10-CM | POA: Diagnosis not present

## 2023-11-20 DIAGNOSIS — I5042 Chronic combined systolic (congestive) and diastolic (congestive) heart failure: Secondary | ICD-10-CM | POA: Insufficient documentation

## 2023-11-20 DIAGNOSIS — Z6834 Body mass index (BMI) 34.0-34.9, adult: Secondary | ICD-10-CM | POA: Diagnosis not present

## 2023-11-20 DIAGNOSIS — E349 Endocrine disorder, unspecified: Secondary | ICD-10-CM

## 2023-11-20 DIAGNOSIS — E785 Hyperlipidemia, unspecified: Secondary | ICD-10-CM | POA: Insufficient documentation

## 2023-11-20 DIAGNOSIS — Z833 Family history of diabetes mellitus: Secondary | ICD-10-CM | POA: Insufficient documentation

## 2023-11-20 DIAGNOSIS — E1122 Type 2 diabetes mellitus with diabetic chronic kidney disease: Secondary | ICD-10-CM | POA: Insufficient documentation

## 2023-11-20 DIAGNOSIS — G709 Myoneural disorder, unspecified: Secondary | ICD-10-CM | POA: Insufficient documentation

## 2023-11-20 DIAGNOSIS — Z9884 Bariatric surgery status: Secondary | ICD-10-CM | POA: Insufficient documentation

## 2023-11-20 HISTORY — PX: RECTAL EXAM UNDER ANESTHESIA: SHX6399

## 2023-11-20 HISTORY — PX: LASER ABLATION CONDOLAMATA: SHX5941

## 2023-11-20 LAB — GLUCOSE, CAPILLARY
Glucose-Capillary: 174 mg/dL — ABNORMAL HIGH (ref 70–99)
Glucose-Capillary: 169 mg/dL — ABNORMAL HIGH (ref 70–99)

## 2023-11-20 SURGERY — ABLATION, CONDYLOMA, USING LASER
Anesthesia: General

## 2023-11-20 MED ORDER — BUPIVACAINE LIPOSOME 1.3 % IJ SUSP
INTRAMUSCULAR | Status: AC
  Filled 2023-11-20: qty 20

## 2023-11-20 MED ORDER — FENTANYL CITRATE (PF) 250 MCG/5ML IJ SOLN
INTRAMUSCULAR | Status: AC
  Filled 2023-11-20: qty 5

## 2023-11-20 MED ORDER — SUGAMMADEX SODIUM 200 MG/2ML IV SOLN
INTRAVENOUS | Status: DC | PRN
  Administered 2023-11-20: 250 mg via INTRAVENOUS

## 2023-11-20 MED ORDER — LIDOCAINE HCL (PF) 2 % IJ SOLN
INTRAMUSCULAR | Status: DC | PRN
  Administered 2023-11-20: 100 mg via INTRADERMAL

## 2023-11-20 MED ORDER — PHENYLEPHRINE 80 MCG/ML (10ML) SYRINGE FOR IV PUSH (FOR BLOOD PRESSURE SUPPORT)
PREFILLED_SYRINGE | INTRAVENOUS | Status: DC | PRN
  Administered 2023-11-20 (×2): 120 ug via INTRAVENOUS
  Administered 2023-11-20: 160 ug via INTRAVENOUS
  Administered 2023-11-20: 80 ug via INTRAVENOUS

## 2023-11-20 MED ORDER — BUPIVACAINE-EPINEPHRINE (PF) 0.25% -1:200000 IJ SOLN
INTRAMUSCULAR | Status: AC
  Filled 2023-11-20: qty 30

## 2023-11-20 MED ORDER — OXYCODONE HCL 5 MG PO TABS
ORAL_TABLET | ORAL | Status: AC
  Filled 2023-11-20: qty 1

## 2023-11-20 MED ORDER — LIDOCAINE HCL (PF) 2 % IJ SOLN
INTRAMUSCULAR | Status: AC
  Filled 2023-11-20: qty 5

## 2023-11-20 MED ORDER — DIBUCAINE (PERIANAL) 1 % EX OINT
TOPICAL_OINTMENT | CUTANEOUS | Status: DC | PRN
  Administered 2023-11-20: 1 via RECTAL

## 2023-11-20 MED ORDER — ROCURONIUM BROMIDE 10 MG/ML (PF) SYRINGE
PREFILLED_SYRINGE | INTRAVENOUS | Status: DC | PRN
  Administered 2023-11-20: 15 mg via INTRAVENOUS
  Administered 2023-11-20: 60 mg via INTRAVENOUS
  Administered 2023-11-20: 15 mg via INTRAVENOUS

## 2023-11-20 MED ORDER — PHENYLEPHRINE 80 MCG/ML (10ML) SYRINGE FOR IV PUSH (FOR BLOOD PRESSURE SUPPORT)
PREFILLED_SYRINGE | INTRAVENOUS | Status: AC
  Filled 2023-11-20: qty 10

## 2023-11-20 MED ORDER — ONDANSETRON HCL 4 MG/2ML IJ SOLN
INTRAMUSCULAR | Status: DC | PRN
  Administered 2023-11-20: 4 mg via INTRAVENOUS

## 2023-11-20 MED ORDER — MIDAZOLAM HCL 2 MG/2ML IJ SOLN
INTRAMUSCULAR | Status: AC
  Filled 2023-11-20: qty 2

## 2023-11-20 MED ORDER — ACETIC ACID 5 % SOLN
Status: AC
  Filled 2023-11-20: qty 12

## 2023-11-20 MED ORDER — PROPOFOL 10 MG/ML IV BOLUS
INTRAVENOUS | Status: AC
  Filled 2023-11-20: qty 20

## 2023-11-20 MED ORDER — BUPIVACAINE LIPOSOME 1.3 % IJ SUSP: 20.0000 mL | Freq: Once | INTRAMUSCULAR | Status: DC

## 2023-11-20 MED ORDER — PROPOFOL 10 MG/ML IV BOLUS
INTRAVENOUS | Status: DC | PRN
  Administered 2023-11-20: 200 mg via INTRAVENOUS

## 2023-11-20 MED ORDER — ACETIC ACID 5 % SOLN
Status: DC | PRN
  Administered 2023-11-20: 1 via TOPICAL

## 2023-11-20 MED ORDER — OXYCODONE HCL 5 MG PO TABS
5.0000 mg | ORAL_TABLET | Freq: Once | ORAL | Status: AC | PRN
  Administered 2023-11-20: 5 mg via ORAL

## 2023-11-20 MED ORDER — SUGAMMADEX SODIUM 200 MG/2ML IV SOLN
INTRAVENOUS | Status: AC
  Filled 2023-11-20: qty 4

## 2023-11-20 MED ORDER — 0.9 % SODIUM CHLORIDE (POUR BTL) OPTIME
TOPICAL | Status: DC | PRN
Start: 2023-11-20 — End: 2023-11-20
  Administered 2023-11-20: 1000 mL

## 2023-11-20 MED ORDER — CHLORHEXIDINE GLUCONATE 0.12 % MT SOLN
15.0000 mL | Freq: Once | OROMUCOSAL | Status: AC
  Administered 2023-11-20: 15 mL via OROMUCOSAL

## 2023-11-20 MED ORDER — DIBUCAINE (PERIANAL) 1 % EX OINT
TOPICAL_OINTMENT | CUTANEOUS | Status: AC
  Filled 2023-11-20: qty 28

## 2023-11-20 MED ORDER — DIAZEPAM 5 MG PO TABS
5.0000 mg | ORAL_TABLET | Freq: Three times a day (TID) | ORAL | 1 refills | Status: DC | PRN
  Filled 2023-11-20: qty 6, 2d supply, fill #0

## 2023-11-20 MED ORDER — ONDANSETRON HCL 4 MG/2ML IJ SOLN
INTRAMUSCULAR | Status: AC
  Filled 2023-11-20: qty 2

## 2023-11-20 MED ORDER — ROCURONIUM BROMIDE 10 MG/ML (PF) SYRINGE
PREFILLED_SYRINGE | INTRAVENOUS | Status: AC
  Filled 2023-11-20: qty 10

## 2023-11-20 MED ORDER — BUPIVACAINE-EPINEPHRINE (PF) 0.25% -1:200000 IJ SOLN
INTRAMUSCULAR | Status: DC | PRN
  Administered 2023-11-20: 50 mL

## 2023-11-20 MED ORDER — CHLORHEXIDINE GLUCONATE CLOTH 2 % EX PADS: 6.0000 | MEDICATED_PAD | Freq: Once | CUTANEOUS | Status: DC

## 2023-11-20 MED ORDER — INSULIN ASPART 100 UNIT/ML IJ SOLN: 0.0000 [IU] | INTRAMUSCULAR | Status: DC | PRN

## 2023-11-20 MED ORDER — OXYCODONE-ACETAMINOPHEN 10-325 MG PO TABS
1.0000 | ORAL_TABLET | Freq: Four times a day (QID) | ORAL | 0 refills | Status: DC | PRN
  Filled 2023-11-20: qty 30, 8d supply, fill #0

## 2023-11-20 MED ORDER — FENTANYL CITRATE (PF) 250 MCG/5ML IJ SOLN
INTRAMUSCULAR | Status: DC | PRN
  Administered 2023-11-20: 100 ug via INTRAVENOUS
  Administered 2023-11-20 (×3): 25 ug via INTRAVENOUS

## 2023-11-20 MED ORDER — OXYCODONE HCL 5 MG/5ML PO SOLN: 5.0000 mg | Freq: Once | ORAL | Status: AC | PRN

## 2023-11-20 MED ORDER — FENTANYL CITRATE PF 50 MCG/ML IJ SOSY
PREFILLED_SYRINGE | INTRAMUSCULAR | Status: AC
  Filled 2023-11-20: qty 2

## 2023-11-20 MED ORDER — ACETAMINOPHEN 500 MG PO TABS: 1000.0000 mg | ORAL_TABLET | ORAL | Status: DC

## 2023-11-20 MED ORDER — MIDAZOLAM HCL 2 MG/2ML IJ SOLN
INTRAMUSCULAR | Status: DC | PRN
  Administered 2023-11-20: 2 mg via INTRAVENOUS

## 2023-11-20 MED ORDER — ORAL CARE MOUTH RINSE: 15.0000 mL | Freq: Once | OROMUCOSAL | Status: AC

## 2023-11-20 MED ORDER — INSULIN ASPART 100 UNIT/ML IJ SOLN
INTRAMUSCULAR | Status: AC
  Administered 2023-11-20: 2 [IU] via SUBCUTANEOUS
  Filled 2023-11-20: qty 1

## 2023-11-20 MED ORDER — GABAPENTIN 300 MG PO CAPS: 300.0000 mg | ORAL_CAPSULE | ORAL | Status: DC

## 2023-11-20 MED ORDER — FENTANYL CITRATE PF 50 MCG/ML IJ SOSY
25.0000 ug | PREFILLED_SYRINGE | INTRAMUSCULAR | Status: DC | PRN
  Administered 2023-11-20 (×2): 50 ug via INTRAVENOUS

## 2023-11-20 MED ORDER — LACTATED RINGERS IV SOLN: INTRAVENOUS | Status: DC

## 2023-11-20 SURGICAL SUPPLY — 34 items
BAG COUNTER SPONGE SURGICOUNT (BAG) IMPLANT
BENZOIN TINCTURE PRP APPL 2/3 (GAUZE/BANDAGES/DRESSINGS) ×1 IMPLANT
BLADE SURG 15 STRL LF DISP TIS (BLADE) IMPLANT
BRIEF MESH DISP LRG (UNDERPADS AND DIAPERS) ×1 IMPLANT
CNTNR URN SCR LID CUP LEK RST (MISCELLANEOUS) ×1 IMPLANT
COVER SURGICAL LIGHT HANDLE (MISCELLANEOUS) ×1 IMPLANT
DRAPE LAPAROTOMY T 102X78X121 (DRAPES) ×1 IMPLANT
ELECT NDL BLADE 2-5/6 (NEEDLE) IMPLANT
ELECT NEEDLE BLADE 2-5/6 (NEEDLE) IMPLANT
ELECT PENCIL ROCKER SW 15FT (MISCELLANEOUS) ×1 IMPLANT
ELECT REM PT RETURN 15FT ADLT (MISCELLANEOUS) ×1 IMPLANT
GAUZE 4X4 16PLY ~~LOC~~+RFID DBL (SPONGE) ×1 IMPLANT
GAUZE PAD ABD 8X10 STRL (GAUZE/BANDAGES/DRESSINGS) IMPLANT
GAUZE SPONGE 4X4 12PLY STRL (GAUZE/BANDAGES/DRESSINGS) IMPLANT
GLOVE ECLIPSE 8.0 STRL XLNG CF (GLOVE) ×1 IMPLANT
GLOVE INDICATOR 8.0 STRL GRN (GLOVE) ×1 IMPLANT
GOWN STRL REUS W/ TWL XL LVL3 (GOWN DISPOSABLE) ×3 IMPLANT
KIT BASIN OR (CUSTOM PROCEDURE TRAY) ×1 IMPLANT
KIT TURNOVER KIT A (KITS) ×1 IMPLANT
LOOP VESSEL MAXI BLUE (MISCELLANEOUS) IMPLANT
NDL HYPO 22X1.5 SAFETY MO (MISCELLANEOUS) ×1 IMPLANT
NEEDLE HYPO 22X1.5 SAFETY MO (MISCELLANEOUS) ×1 IMPLANT
PACK BASIC VI WITH GOWN DISP (CUSTOM PROCEDURE TRAY) ×1 IMPLANT
PENCIL BUTTON HOLSTER BLD 10FT (ELECTRODE) ×1 IMPLANT
SCRUB CHG 4% DYNA-HEX 4OZ (MISCELLANEOUS) ×1 IMPLANT
SPIKE FLUID TRANSFER (MISCELLANEOUS) ×1 IMPLANT
SURGILUBE 2OZ TUBE FLIPTOP (MISCELLANEOUS) ×1 IMPLANT
SUT CHROMIC 2 0 SH (SUTURE) ×1 IMPLANT
SUT VIC AB 2-0 UR6 27 (SUTURE) ×6 IMPLANT
SYR 20ML LL LF (SYRINGE) ×1 IMPLANT
SYR 3ML LL SCALE MARK (SYRINGE) IMPLANT
TOWEL OR 17X26 10 PK STRL BLUE (TOWEL DISPOSABLE) ×1 IMPLANT
VACUUM HOSE 7/8X10 W/ WAND (MISCELLANEOUS) ×1 IMPLANT
YANKAUER SUCT BULB TIP 10FT TU (MISCELLANEOUS) ×1 IMPLANT

## 2023-11-20 NOTE — Interval H&P Note (Signed)
 History and Physical Interval Note:  11/20/2023 7:20 AM  [name].  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  The various methods of treatment have been discussed with the patient and family. After consideration of risks, benefits and other options for treatment, the patient has consented to  Procedure(s) with comments: ABLATION, CONDYLOMA, USING LASER (N/A) - LASER REMOVAL ABLATION OF CONDYLOMATA ANORECTAL EXAMINATION UNDER ANESTHESIA EXAM UNDER ANESTHESIA, RECTUM (N/A) as a surgical intervention.  The patient's history has been reviewed, patient examined, no change in status, stable for surgery.  I have reviewed the patient's chart and labs.  Questions were answered to the patient's satisfaction.    Patient rescheduled given concern for outpatient center not comfortable with proceeding.  Patient had fall and injury to left shoulder.  Has been getting physical therapy with steroid injections and followed by orthopedic surgery.  However has limited mobility of left shoulder.  Will position the patient lithotomy with left arm tucked and proceed with surgery.  I have re-reviewed the the patient's records, history, medications, and allergies.  I have re-examined the patient.  I again discussed intraoperative plans and goals of post-operative recovery.  The patient agrees to proceed.  [name]  [birth year] [id number]  Patient Care Team: [name], MD as PCP - General (Pediatrics) [name], MD as PCP - Advanced Heart Failure (Cardiology) Darron Deatrice LABOR, MD as PCP - Cardiology (Cardiology) Donette Ellouise LABOR, FNP as Nurse Practitioner (Family Medicine) [name], MD as Consulting Physician (Cardiology) Tye Millet, DO as Consulting Physician (General Surgery) Sheldon Standing, MD as Consulting Physician (Colon and Rectal Surgery) Auston Elsie Hero, PA-C as Physician Assistant  (Transplant) Melanee Annah BROCKS, MD as Consulting Physician (Oncology)  Patient Active Problem List   Diagnosis Date Noted   Bleeding from the nose 09/25/2023   Fracture of left shoulder 09/12/2023   Whiplash injuries, subsequent encounter 06/24/2023   Thrombocytopenia (HCC) 04/08/2023   Obesity (BMI 30-39.9) 04/08/2023   Myocardial injury 04/08/2023   Acute-on-chronic kidney injury (HCC) 02/04/2023   History of Bell's palsy with chronic left facial droop 02/03/2023   Anal condyloma 11/26/2022   Diastasis recti 11/26/2022   Hx of adenomatous colonic polyps 11/26/2022   Longstanding persistent atrial fibrillation (HCC) 11/26/2022   Asthmatic bronchitis with acute exacerbation 11/18/2022   Iron  deficiency anemia 08/26/2022   CKD (chronic kidney disease) stage 4, GFR 15-29 ml/min (HCC) 07/07/2022   Chronic anticoagulation 07/07/2022   Complicated UTI (urinary tract infection) 06/24/2022   Ischemic cardiomyopathy 06/22/2022   Permanent atrial fibrillation (HCC) 06/22/2022   Anemia due to stage 4 chronic kidney disease (HCC) 03/04/2022   Internal hemorrhoids 01/21/2022   Arthritis of right acromioclavicular joint 11/12/2021   Chronic atrial fibrillation (HCC)    PAH (pulmonary artery hypertension) (HCC)    Type II diabetes mellitus with renal manifestations (HCC)    Obesity with body mass index of 30.0-39.9    Normocytic anemia    Preventative health care 10/30/2021   Mood disorder (HCC) 10/30/2021   Dupuytren's disease of finger with contracture 05/29/2021   GERD (gastroesophageal reflux disease) 04/26/2021   Long-term use of immunosuppressant medication 05/11/2020   Erectile dysfunction due to arterial insufficiency 10/20/2019   Acute gouty arthropathy 03/30/2019   Pulmonary hypertension due to left heart disease (HCC) 01/20/2019   Superficial thrombophlebitis 12/23/2018   Chronic HFrEF (heart failure with reduced ejection fraction) (HCC) 11/19/2016  Chronic diastolic heart  failure (HCC) 91/71/7981   Chronic venous insufficiency 06/05/2016   Proteinuria 06/23/2015   BPH (benign prostatic hyperplasia) 09/25/2014   Immunosuppressed status (HCC) 09/09/2014   H/O gastric bypass 04/25/2014   Benign neoplasm of colon 04/22/2013   Diverticulosis of colon 04/22/2013   Renal transplant recipient, 2012 03/01/2013   Asthma, persistent controlled 02/25/2013   OSA (obstructive sleep apnea) 09/29/2012   Gout 09/13/2012   Immunosuppressive management encounter following kidney transplant 12/25/2010   Essential hypertension 12/17/2010   Hypertension    Hyperlipidemia    CAD s/p CABG 2005 2005    Past Medical History:  Diagnosis Date   Anal condyloma    Anemia    Anxiety    Aortic atherosclerosis (HCC)    Aortic stenosis    a.) TTE 10/2021: Mod AS. AVA 1.06cm^2 (VTI). Mean grad ; b.) TTE 04/02/2022: no AV stenosis   Asthma, persistent controlled 02/25/2013   Attention or concentration deficit 04/26/2021   BPH with obstruction/lower urinary tract symptoms 09/25/2014   CAD (coronary artery disease) 2005   a.) LHC 1996 -> HG stenosis mLAD -> PTCA/PCI with stent x 1 (unk type) -> complicated by CFA pseudoanurysm (required surg);  b.) LHC 08/10/2002  -> 95% LAD, 70% ISR LAD, 80% pOM, 70% mRCA --> CVTS consult; c.) 4v CABG 08/13/2002; c.) 03/2018 MV: Small, fixed inferoapical and apical sep defect. No isc. EF 47% (60-65% by 05/2018 TTE); d.) 02/2021 MV: small Apical inf fixed defect. No isc -> low risk.   Cardiomegaly    Cellulitis and abscess of left leg 07/22/2020   Chronic atrial fibrillation    a.) CHA2DS2VASc = 4 (CHF, HTN, vascular disease history, T2DM);  b.) rate/rhythm maintained without pharmacological intervention; chronically anticoagulated with apixaban    Chronic combined systolic and diastolic CHF (congestive heart failure)    a.) TTE 7/18 : EF 45-50%; b.) TTE 05/2018 : EF 60-65%, RVSP 74.8; c.) TTE 12/2018: EF 50-55%. Sev dil LA; d.) TTE 04/2019: EF  45-50%; e.) TTE 07/2021: EF 40-45%, G3DD; f.) TTE 10/2021: EF 40-45%, mild conc LVH, septal-lat dyssynchrony (LBBB), mildly red RVSF, mild-mod dil LA, mildly dil RA, mild-mod MS, mod AS; g.) TTE 04/02/2022: EF 40-45%, glob HK, LVH, red RVSF, RVE, sev LAE, mild-mod RAE, mild MR   Chronic venous stasis dermatitis of both lower extremities    Cytomegaloviral disease 2017   Diverticulosis of colon 04/22/2013   Elevated RIGHT hemidiaphragm    Erectile dysfunction    a.) on topical TRT + Trimix (alprostadil/papaverine/phentolamine) injections   ESRD (end stage renal disease) (HCC)    a.) dialysis dependent 2004-2012; b.) s/p cadaveric RIGHT renal transplant 09/13/2010   Essential hypertension 12/17/2010   Gout    History of 2019 novel coronavirus disease (COVID-19) 06/30/2019   History of bilateral cataract extraction 10/2017   History of renal transplant 09/13/2010   a.) s/p cadaveric donor transplant 09/13/2010   Hx of bilateral cataract extraction 10/2017   Hyperlipidemia    Hypogonadism in male 09/25/2014   Ischemic cardiomyopathy    Left cephalic vein thrombosis 06/2019   Long term current use of anticoagulant    a.) apixaban    Long term current use of immunosuppressive drug    a.) mycophenolate  + prednisone  + tacrolimus    Major depressive disorder 10/04/2013   Mitral stenosis 11/07/2021   a.) TTE 11/07/2021: severe MAC, mild-mod MS (MPG 6 mmHg); b.) TTE 04/02/2022: no MV stenosis   Murmur    Obesity hypoventilation syndrome 03/31/2012  OSA treated with BiPAP 09/29/2012   PAH (pulmonary artery hypertension)    a. 04/2019 RHC: RA 19, RV 80/20, PA 78/31 (51), PCWP 25, CO/CI 7.69/3.26. PVR 3.25 --> Sev PAH, likely primarily PV HTN; b.) RHC 04/04/2022: mRA 13, mPA 40, mPCWP 20, PA sat 59, AO sat 92, CO 7.63, CI 3.26, PVR 2.6 --> mod portal venous HTN   Pancreatitis    S/P CABG x 4 08/13/2002   a.) LIMA-LAD, LRA-OM, SVG-D1, SVG-dRCA   S/P gastric bypass    Synovitis of finger 06/11/2019    Trigger finger, unspecified little finger 12/23/2018   Type 2 diabetes mellitus with hyperglycemia, with long-term current use of insulin  09/09/2014   a.) has FreeStyle Libre CGM   Ulcer    Left shin goes to wound care center [facility] qweek   Wears glasses    Wears hearing aid in both ears     Past Surgical History:  Procedure Laterality Date   CARDIAC CATHETERIZATION N/A 08/10/2002   CATARACT EXTRACTION W/PHACO Right 10/29/2017   Procedure: CATARACT EXTRACTION PHACO AND INTRAOCULAR LENS PLACEMENT (IOC)  RIGHT DIABETIC;  Surgeon: Mittie Gaskin, MD;  Location: [facility];  Service: Ophthalmology;  Laterality: Right   CATARACT EXTRACTION W/PHACO Left 11/18/2017   Procedure: CATARACT EXTRACTION PHACO AND INTRAOCULAR LENS PLACEMENT (IOC) LEFT IVA/TOPICAL;  Surgeon: Mittie Gaskin, MD;  Location: [facility];  Service: Ophthalmology;  Laterality: Left   COLONOSCOPY WITH PROPOFOL  N/A 04/22/2013   Procedure: COLONOSCOPY WITH PROPOFOL ;  Surgeon: Toribio SHAUNNA Cedar, MD;  Location: [facility];  Service: Endoscopy;  Laterality: N/A;   CORONARY ANGIOPLASTY WITH STENT PLACEMENT N/A 1996   CORONARY ARTERY BYPASS GRAFT N/A 08/13/2002   Procedure: 4v CORONARY ARTERY BYPASS GRAFTING; Location: [facility]; Surgeon: Dessa Laine, MD   CYSTOSCOPY WITH INSERTION OF UROLIFT N/A 12/25/2021   Procedure: CYSTOSCOPY WITH INSERTION OF UROLIFT;  Surgeon: Twylla Glendia BROCKS, MD;  Location: [facility];  Service: Urology;  Laterality: N/A;   DG ANGIO AV SHUNT*L*     right and left upper arms   FASCIOTOMY  03/03/2012   Procedure: FASCIOTOMY;  Surgeon: Arley JONELLE Curia, MD;  Location: [facility];  Service: Orthopedics;  Laterality: Right;  FASCIOTOMY RIGHT SMALL FINGER   FASCIOTOMY Left 08/17/2013   Procedure: FASCIOTOMY LEFT RING;  Surgeon: Arley JONELLE Curia, MD;  Location: [facility];  Service: Orthopedics;  Laterality: Left;   INCISION AND DRAINAGE ABSCESS Left  10/15/2015   Procedure: INCISION AND DRAINAGE ABSCESS;  Surgeon: Laneta JULIANNA Luna, MD;  Location: [facility];  Service: General;  Laterality: Left;   KIDNEY TRANSPLANT  09/13/2010   cadaver--at [facility]   LASER ABLATION CONDOLAMATA N/A 01/08/2023   Procedure: LASER REMOVAL ABLATION OF CONDYLOMATA;  Surgeon: Sheldon Standing, MD;  Location: [facility];  Service: General;  Laterality: N/A;  GEN AND LOCAL   RECTAL EXAM UNDER ANESTHESIA N/A 01/08/2023   Procedure: ANORECTAL EXAM UNDER ANESTHESIA;  Surgeon: Sheldon Standing, MD;  Location: [facility];  Service: General;  Laterality: N/A;   RIGHT HEART CATH N/A 11/15/2016   Procedure: RIGHT HEART CATH;  Surgeon: Darron Deatrice LABOR, MD;  Location: [facility];  Service: Cardiovascular;  Laterality: N/A;   RIGHT HEART CATH N/A 05/03/2019   Procedure: RIGHT HEART CATH;  Surgeon: [name], MD;  Location: [facility];  Service: Cardiovascular;  Laterality: N/A;   RIGHT HEART CATH N/A 04/04/2022   Procedure: RIGHT HEART CATH;  Surgeon: [name], MD;  Location: [facility];  Service: Cardiovascular;  Laterality: N/A;   ROUX-EN-Y GASTRIC BYPASS N/A 2015   TYMPANIC MEMBRANE REPAIR Left 03/2010   VASECTOMY     WART FULGURATION N/A 05/31/2022   Procedure: FULGURATION ANAL WART;  Surgeon: Tye Millet, DO;  Location: [facility];  Service: General;  Laterality: N/A;    Social History   Socioeconomic History   Marital status: Married    Spouse name: Not on file   Number of children: 2   Years of education: 14   Highest education level: Associate degree: academic program  Occupational History   Occupation: Presenter, broadcasting: unemployed    Comment: disabled due to kidney failure  Tobacco Use   Smoking status: Never   Smokeless tobacco: Never  Vaping Use   Vaping status: Never Used  Substance and Sexual Activity   Alcohol use: Not Currently    Comment: occ   Drug use: No   Sexual activity: Not on file  Other  Topics Concern   Not on file  Social History Narrative   Has living will   Wife is health care POA---son Donnice is alternate   Would accept resuscitation attempts   No tube feedings if cognitively unaware   Social Drivers of Health   Financial Resource Strain: Not on file  Food Insecurity: No Food Insecurity (09/25/2023)   Hunger Vital Sign    Worried About Running Out of Food in the Last Year: Never true    Ran Out of Food in the Last Year: Never true  Transportation Needs: No Transportation Needs (09/25/2023)   PRAPARE - Administrator, Civil Service (Medical): No    Lack of Transportation (Non-Medical): No  Physical Activity: Not on file  Stress: Not on file  Social Connections: Socially Integrated (04/11/2023)   Social Connection and Isolation Panel    Frequency of Communication with Friends and Family: More than three times a week    Frequency of Social Gatherings with Friends and Family: More than three times a week    Attends Religious Services: More than 4 times per year    Active Member of [employer] or Organizations: Yes    Attends Engineer, structural: More than 4 times per year    Marital Status: Married  Catering manager Violence: Not At Risk (09/25/2023)   Humiliation, Afraid, Rape, and Kick questionnaire    Fear of Current or Ex-Partner: No    Emotionally Abused: No    Physically Abused: No    Sexually Abused: No    Family History  Problem Relation Age of Onset   Heart disease Father    Kidney failure Father    Kidney disease Father    Diabetes Maternal Grandmother    Breast cancer Maternal Grandmother    Valvular heart disease Mother    Liver cancer Paternal Uncle    Liver cancer Paternal Grandmother    Prostate cancer Neg Hx     Medications Prior to Admission  Medication Sig Dispense Refill Last Dose/Taking   albuterol  (PROVENTIL ) (2.5 MG/3ML) 0.083% nebulizer solution Take 3 mLs (2.5 mg total) by nebulization every 6 (six) hours as needed  for wheezing or shortness of breath. 150 mL 0 Past Month   buPROPion  (WELLBUTRIN  SR) 150 MG 12 hr tablet Take 1 tablet (150 mg total) by mouth 2 (two) times daily. 180 tablet 3 11/20/2023 Morning   colchicine  0.6 MG tablet Take 0.6 mg by mouth every other day.  11/20/2023 Morning   Cyanocobalamin  (B-12 PO) Take 1 tablet by mouth daily.   Past Week   febuxostat  (ULORIC ) 40 MG tablet Take 3 tablets (120 mg total) by mouth daily. 90 tablet 5 11/20/2023 Morning   fluticasone  (FLONASE ) 50 MCG/ACT nasal spray Place 2 sprays into both nostrils daily. (Patient taking differently: Place 2 sprays into both nostrils daily as needed for allergies or rhinitis.) 16 g 11 11/19/2023   gabapentin  (NEURONTIN ) 300 MG capsule Take 1 capsule (300 mg total) by mouth 2 (two) times daily. 180 capsule 3 11/20/2023 Morning   HYDROcodone -acetaminophen  (NORCO/VICODIN) 5-325 MG tablet Take 1 tablet by mouth every 6 (six) hours as needed for moderate pain (pain score 4-6). 60 tablet 0 11/19/2023   insulin  glargine-yfgn (SEMGLEE ) 100 UNIT/ML Pen Inject 20 Units into the skin daily. (Patient taking differently: Inject 14 Units into the skin daily.) 45 mL 3 Past Week   losartan  (COZAAR ) 25 MG tablet Take 1 tablet (25 mg total) by mouth daily. 90 tablet 3 11/19/2023   montelukast  (SINGULAIR ) 10 MG tablet Take 1 tablet (10 mg total) by mouth at bedtime. 30 tablet 11 11/20/2023 Morning   mycophenolate  (MYFORTIC ) 180 MG EC tablet Take 2 tablets (360 mg total) by mouth every morning AND 1 tablet (180 mg total) every evening. 360 tablet 3 11/20/2023 Morning   omeprazole  (PRILOSEC) 20 MG capsule Take 1 capsule (20 mg total) by mouth daily. 30 capsule 11 11/20/2023 Morning   potassium chloride  SA (KLOR-CON  M) 20 MEQ tablet Take 1 tablet (20 mEq total) by mouth daily as needed. 90 tablet 3 Past Week   predniSONE  (DELTASONE ) 5 MG tablet Take 1 tablet (5 mg total) by mouth daily. 90 tablet 3 11/20/2023 Morning   rosuvastatin  (CRESTOR ) 10 MG tablet Take 1  tablet (10 mg total) by mouth daily. 90 tablet 3 11/20/2023 Morning   sulfamethoxazole -trimethoprim  (BACTRIM ) 400-80 MG tablet Take 1 tablet by mouth 3 (three) times a week. 36 tablet 3 11/19/2023   tacrolimus  (PROGRAF ) 1 MG capsule Take 2 capsules (2 mg total) by mouth every morning AND 1 capsule (1 mg total) every evening. 90 capsule 11 11/20/2023 Morning   tamsulosin  (FLOMAX ) 0.4 MG CAPS capsule Take 1 capsule (0.4 mg total) by mouth daily. 30 capsule 11 11/20/2023 Morning   tiZANidine  (ZANAFLEX ) 2 MG tablet Take 1 tablet (2 mg total) by mouth every 6 (six) hours as needed for muscle spasms. 30 tablet 1 Past Week   torsemide  (DEMADEX ) 100 MG tablet Take 1 tablet (100 mg total) by mouth 2 (two) times daily. 90 tablet 1 11/19/2023   Zinc  50 MG TABS Take 1 tablet (50 mg total) by mouth daily. 100 tablet 3 Past Week   albuterol  (VENTOLIN  HFA) 108 (90 Base) MCG/ACT inhaler Inhale 2 puffs into the lungs every 6 (six) hours as needed for wheezing or shortness of breath. 6.7 g 5    apixaban  (ELIQUIS ) 5 MG TABS tablet Take 1 tablet (5 mg total) by mouth 2 (two) times daily. 180 tablet 3 11/16/2023 at  6:00 PM   Continuous Glucose Sensor (FREESTYLE LIBRE 3 SENSOR) MISC Use one every 2 weeks. 6 each 3    dapagliflozin  propanediol (FARXIGA ) 10 MG TABS tablet Take 1 tablet (10 mg total) by mouth daily before breakfast. 90 tablet 3 11/16/2023   diazepam  (VALIUM ) 5 MG tablet Take 2 tablets (10 mg total) by mouth Once as needed as directed for MRI (Patient not taking: Reported on 11/06/2023) 2 tablet 0  diazepam  (VALIUM ) 5 MG tablet MUST HAVE DRIVER. Arrive 1 hour prior to your MRI appt. Take 1 tablet once your driver has been confirmed. If needed, take second tablet 30 minutes prior to MRI. (Patient not taking: Reported on 11/06/2023) 2 tablet 0    Insulin  Pen Needle (TECHLITE PEN NEEDLES) 31G X 5 MM MISC 4 (four) times daily. 400 each 3    Insulin  Pen Needle (UNIFINE PENTIPS) 31G X 5 MM MISC Use 4 times daily as  directed 400 each 3    metolazone  (ZAROXOLYN ) 2.5 MG tablet Take 1 tablet (2.5 mg total) by mouth as directed. By the heart failure clinic (Patient taking differently: Take 2.5 mg by mouth daily as needed (Edema).) 30 tablet 1    silver  sulfADIAZINE  (SILVADENE ) 1 % cream Apply 1 Application topically daily. (Patient not taking: Reported on 11/06/2023) 50 g 1    sodium chloride  (OCEAN) 0.65 % SOLN nasal spray Place 2 sprays into both nostrils every 8 (eight) hours for 10 days. (Patient taking differently: Place 2 sprays into both nostrils as needed for congestion.)      tadalafil  (CIALIS ) 20 MG tablet Take 0.5-1 tablets (10-20 mg total) by mouth every other day as needed for erectile dysfunction. (Patient not taking: Reported on 11/06/2023) 10 tablet 11    tadalafil  (CIALIS ) 5 MG tablet Take 5 mg by mouth daily as needed for erectile dysfunction.      Testosterone  20.25 MG/ACT (1.62%) GEL Place 2 Pump onto the skin daily. 150 g 3    tirzepatide  (MOUNJARO ) 5 MG/0.5ML Pen Inject 5 mg into the skin once a week. 2 mL 5 11/10/2023    Current Facility-Administered Medications  Medication Dose Route Frequency Provider Last Rate Last Admin   acetaminophen  (TYLENOL ) tablet 1,000 mg  1,000 mg Oral On Call to OR Sheldon Standing, MD       bupivacaine  liposome (EXPAREL ) 1.3 % injection 266 mg  20 mL Infiltration Once Sheldon Standing, MD       Chlorhexidine  Gluconate Cloth 2 % PADS 6 each  6 each Topical Once Sheldon Standing, MD       gabapentin  (NEURONTIN ) capsule 300 mg  300 mg Oral On Call to OR Sheldon Standing, MD       insulin  aspart (novoLOG ) injection 0-14 Units  0-14 Units Subcutaneous Q2H PRN Peggye Delon Brunswick, MD   2 Units at 11/20/23 9297   lactated ringers  infusion   Intravenous Continuous Keneth Lynwood POUR, MD 10 mL/hr at 11/20/23 9378 New Bag at 11/20/23 0621     Allergies  Allergen Reactions   Iodinated Contrast Media Other (See Comments)    Kidney transplant  Iodinated contrast media (substance)    Iodine Other (See Comments)    Kidney transplant   Acetaminophen  Other (See Comments)    BC of transplant  acetaminophen    Nsaids Other (See Comments)    Kidney transplant   Ibuprofen Other (See Comments)    Due to kidney transplant    BP (!) 145/70   Pulse 78   Temp 97.8 F (36.6 C) (Oral)   Resp 20   Wt 107.5 kg   SpO2 93%   BMI 34.01 kg/m   Labs: Results for orders placed or performed during the hospital encounter of 11/20/23 (from the past 48 hours)  Glucose, capillary     Status: Abnormal   Collection Time: 11/20/23  5:50 AM  Result Value Ref Range   Glucose-Capillary 174 (H) 70 - 99 mg/dL    Comment:  Glucose reference range applies only to samples taken after fasting for at least 8 hours.   Comment 1 Notify RN    Comment 2 Document in Chart    *Note: Due to a large number of results and/or encounters for the requested time period, some results have not been displayed. A complete set of results can be found in Results Review.    Imaging / Studies: DG Chest Port 1 View Result Date: 11/12/2023 CLINICAL DATA:  [id number] Weakness [id number] EXAM: PORTABLE CHEST 1 VIEW COMPARISON:  09/26/2023. FINDINGS: Low lung volume. Redemonstration of multiple surgical staples overlying the left upper hemithorax. There is stable calcified right paratracheal lymph node. There is mild diffuse pulmonary vascular congestion, likely accentuated by low lung volume. No frank pulmonary edema. Bilateral lung fields are otherwise clear. Bilateral costophrenic angles are clear. Stable moderately enlarged cardio-mediastinal silhouette. Sternotomy wires noted. No acute osseous abnormalities. The soft tissues are within normal limits. IMPRESSION: *Mild diffuse pulmonary vascular congestion, likely accentuated by low lung volume. No frank pulmonary edema. Electronically Signed   By: Ree Molt M.D.   On: 11/12/2023 17:55     .Elspeth KYM Schultze, M.D., F.A.C.S. [facility] [address] [place], [place] [address] [phone] Main / Paging  11/20/2023 7:22 AM     Elspeth JAYSON Schultze

## 2023-11-20 NOTE — Op Note (Signed)
 11/20/2023  8:50 AM  PATIENT:  [name].  63 y.o. male  Patient Care Team: [name], MD as PCP - General (Pediatrics) [name], MD as PCP - Advanced Heart Failure (Cardiology) Darron Deatrice LABOR, MD as PCP - Cardiology (Cardiology) Donette Ellouise LABOR, FNP as Nurse Practitioner (Family Medicine) [name], MD as Consulting Physician (Cardiology) Tye Millet, DO as Consulting Physician (General Surgery) Sheldon Standing, MD as Consulting Physician (Colon and Rectal Surgery) Sparks, Elsie Hero, PA-C as Physician Assistant (Transplant) Melanee Annah BROCKS, MD as Consulting Physician (Oncology) Marchia Drivers, MD as Consulting Physician (Orthopedic Surgery)  PRE-OPERATIVE DIAGNOSIS:  HEMORRHOID - INTERNAL GRADE 2 PROLAPSING and ANAL CONDYLOMATA  POST-OPERATIVE DIAGNOSIS:   ANAL CONDYLOMATA HEMORRHOID - INTERNAL GRADE 3 PROLAPSING HEMORRHOID - INTERNAL GRADE 2 PROLAPSING  PROCEDURE:  ANAL WART ABLATION - CO2 laser & cautery HEMORRHOIDAL LIGATION & HEMORRHOIDOPEXY, HEMORRHOIDECTOMY x 2 ANORECTAL EXAMINATION UNDER ANESTHESIA  SURGEON:  Standing KYM Sheldon, MD  ASSISTANT:  (n/a)   ANESTHESIA:  General endotracheal intubation anesthesia (GETA) and Anorectal and field block for perioperative & postoperative pain control provided with liposomal bupivacaine  (Experel) 20mL mixed with 30mL of bupivicaine 0.25% with epinephrine   Estimated Blood Loss (EBL):   Total I/O In: 500 [I.V.:500] Out: 0 .   (See anesthesia record)  Delay start of Pharmacological VTE agent (>24hrs) due to concerns of significant anemia, surgical blood loss, or risk of bleeding?:  no  DRAINS: (None)  SPECIMEN:  Hemorrhoid: Right lateral and Left posterior  DISPOSITION OF SPECIMEN:  Pathology  COUNTS:  Sponge, needle, & instrument counts CORRECT at the conclusion of the case.      PLAN OF CARE: Discharge to home after PACU  PATIENT DISPOSITION:  PACU - hemodynamically  stable.  INDICATION: Patient with perianal condyloma.  Burden felt too much to treat in office.  Kidney transplant patient chronically immunosuppressed.  Some intermittent bleeding with some irritated hemorrhoids.  Recommendation made for examination under anesthesia with ablation and possible removal of condyloma.    The anatomy & physiology of the anorectal region was discussed.  The pathophysiology of anorectal warts and differential diagnosis was discussed.  Natural history risks without surgery was discussed such as further growth and cancer.   I stressed the importance of office follow-up to catch early recurrence & minimize/halt progression of disease.  Interventions such as cauterization by topical agents were discussed.  The patient's symptoms are not adequately controlled by non-operative treatments.  I feel the risks & problems of no surgery outweigh the operative risks; therefore, I recommended surgery to treat the anal warts by removal, ablation and/or cauterization.  Risks such as bleeding, infection, need for further treatment, heart attack, death, and other risks were discussed.   I noted a good likelihood this will help address the problem. Goals of post-operative recovery were discussed as well.  Possibility that this will not correct all symptoms was explained.  Post-operative pain, bleeding, constipation, and other problems after surgery were discussed.  We will work to minimize complications.   Educational handouts further explaining the pathology, treatment options, and bowel regimen were given as well.  Questions were answered.  The patient expresses understanding & wishes to proceed with surgery.  OR FINDINGS: Perianal external condyloma right posterior quadrant most dominant.  Not large burden.  Left posterior hemorrhoidal pile with some fibrotic dysplastic changes suspicious for some and/condylomata.  Excision done.  Right lateral inflamed irritated grade 2-3 hemorrhoid.  Ligation  pexy excision done.  DESCRIPTION:   Informed consent was confirmed. Patient underwent general anesthesia without difficulty.  Patient had a history of a recent left shoulder injury with some pinning of his shoulder undergoing physical therapy with orthopedic surgery.  Therefore the patient was carefully positioned with left arm tucked and protected in its more natural position and lithotomy positioning.  The perianal region was prepped and draped in sterile fashion. Surgical timeout confirmed or plan.  I did digital rectal examination and then transitioned over to anoscopy to get a sense of the anatomy.  I also had done staining with acetic acid . Findings noted above. Most concerning lesion was in the left posterior anal canal.  Look like a fibrotic hemorrhoid but possible dysplastic changes.  A side to do excision for biopsy.  2-0 Vicryl suture figure-of-eight 6 cm proximal to anal verge in the left posterior column) that down and interrupting fashion.  I then excised the left posterior hemorrhoidal pile/mass in a longitudinal biconcave fusiform fashion.  I then ran the stitch down to help close the defect of the hemorrhoidectomy in a good fashion for pexy as well.  Patient had irritated right lateral hemorrhoid.  Some irritation there and thickening so proceeded to hemorrhoidectomy in a similar fashion there.  I proceeded to ablate the condyloma using the CO2 laser.  Set to 7 W initially but had to transition to 15 W.  I worked peripherally and then into anal canal.  I  repeated inspection.   I used laser along the anal canal and anal verge on some mildly dysplastic areas but tissues were rather friable.  Ended up having to clean up with some needlepoint touch point cautery as well.  Ensured hemostasis with suturing on the left posterior pile.  Mucosa was quite friable and of doing some touch part cautery in that region.  No other major abnormalities noted.  I repeated anoscopy and examination.   Hemostasis was good.  Patient being extubated to go to the recovery room.  I had discussed postop care in detail with the patient in the preop holding area.  Instructions for post-operative recovery had been given in the office and moral be given at discharge.  Prescription for pain medicine written.  I discussed operative findings, updated the patient's status, discussed probable steps to recovery, and gave postoperative recommendations to the patient's spouse, Loui Massenburg.  Recommendations were made.  Questions were answered.  She expressed understanding & appreciation.  Elspeth KYM Schultze, M.D., F.A.C.S. Gastrointestinal and Minimally Invasive Surgery [facility] [address] [place], [place] [address] [phone] Main / Paging

## 2023-11-20 NOTE — Transfer of Care (Signed)
 Immediate Anesthesia Transfer of Care Note  Patient: [name].  Procedure(s) Performed: ABLATION, CONDYLOMA, USING LASER EXAM UNDER ANESTHESIA, RECTUM  Patient Location: PACU  Anesthesia Type:General  Level of Consciousness: awake and patient cooperative  Airway & Oxygen  Therapy: Patient Spontanous Breathing and Patient connected to face mask  Post-op Assessment: Report given to RN and Post -op Vital signs reviewed and stable  Post vital signs: Reviewed and stable  Last Vitals:  Vitals Value Taken Time  BP 130/71 11/20/23 08:56  Temp    Pulse 76 11/20/23 09:00  Resp 20 11/20/23 09:00  SpO2 99 % 11/20/23 09:00  Vitals shown include unfiled device data.  Last Pain:  Vitals:   11/20/23 0602  TempSrc:   PainSc: 4          Complications: No notable events documented.

## 2023-11-20 NOTE — Anesthesia Procedure Notes (Signed)
 Procedure Name: Intubation Date/Time: 11/20/2023 7:41 AM  Performed by: Augusta Daved SAILOR, CRNAPre-anesthesia Checklist: Patient identified, Emergency Drugs available, Suction available and Patient being monitored Patient Re-evaluated:Patient Re-evaluated prior to induction Oxygen  Delivery Method: Circle System Utilized Preoxygenation: Pre-oxygenation with 100% oxygen  Induction Type: IV induction Ventilation: Mask ventilation without difficulty Laryngoscope Size: Glidescope and 3 Grade View: Grade I Tube type: Oral Number of attempts: 1 Airway Equipment and Method: Stylet and Oral airway Placement Confirmation: ETT inserted through vocal cords under direct vision, positive ETCO2 and breath sounds checked- equal and bilateral Secured at: 23 (at the lip) cm Tube secured with: Tape Dental Injury: Teeth and Oropharynx as per pre-operative assessment

## 2023-11-20 NOTE — Anesthesia Postprocedure Evaluation (Signed)
 Anesthesia Post Note  Patient: Josephine Rudnick.  Procedure(s) Performed: ABLATION, CONDYLOMA, USING LASER HEMORRHOIDAL LIGATION & HEMORRHOIDOPEXY, HEMORRHOIDECTOMY x 2 EXAM UNDER ANESTHESIA, RECTUM     Patient location during evaluation: PACU Anesthesia Type: General Level of consciousness: awake Pain management: pain level controlled Vital Signs Assessment: post-procedure vital signs reviewed and stable Respiratory status: spontaneous breathing, nonlabored ventilation and respiratory function stable Cardiovascular status: blood pressure returned to baseline and stable Postop Assessment: no apparent nausea or vomiting Anesthetic complications: no   No notable events documented.  Last Vitals:  Vitals:   11/20/23 0930 11/20/23 0945  BP: (!) 145/84 137/86  Pulse: 84 73  Resp: 11 19  Temp:    SpO2: 96% 95%    Last Pain:  Vitals:   11/20/23 0945  TempSrc:   PainSc: 5                  Delon Aisha Arch

## 2023-11-20 NOTE — Discharge Instructions (Signed)
 ##############################################  ANORECTAL SURGERY:  POST OPERATIVE INSTRUCTIONS  ######################################################################  EAT Start with a pureed / full liquid diet After 24 hours, gradually transition to a high fiber diet.    CONTROL PAIN Control pain so you can tolerate bowel movements,  walk, sleep, tolerate sneezing/coughing, and go up/down stairs.   HAVE A BOWEL MOVEMENT DAILY Keep your bowels regular to avoid problems.   Taking a fiber supplement 2-4 doses every day to keep bowels soft.   HAVE A BM BY THE SECOND POSTOPERATIVE DAY Use an antidairrheal to slow down diarrhea.   Call if not better after 2 tries  WALK Walk an hour a day.  Control your pain to do that.   CALL IF YOU HAVE PROBLEMS/CONCERNS Call if you are still struggling despite following these instructions. Call if you have concerns not answered by these instructions  ######################################################################    Take your usually prescribed home medications unless otherwise directed. Blood thinners:  You can restart any strong blood thinners after the second postoperative day  for example: COUMADIN (warfarin), XERELTO (rivaroxaban), ELIQUIS  (apixaban ), PLAVIX  (clopidigrel), BRILINTA (ticagrelor), EFFIENT (prasugrel), PRADAXA (dabigatran), etc  Continue aspirin  before & after surgery..     Some oozing/bleeding the first 1-2 weeks is common but should taper down & be small volume.    If you are passing many large clots or having uncontrolling bleeding, call your surgeon  DIET: Follow a light bland diet & liquids the first 24 hours after arrival home, such as soup, liquids, starches, etc.  Be sure to drink plenty of fluids.  Quickly advance to a usual solid diet within a few days.  Avoid fast food or heavy meals as your are more likely to get nauseated or have irregular bowels.  A low-fat, high-fiber diet for the rest of your life  is ideal.  Control pain to avoid problems with urinating or defecating:  Pain is best controlled by a usual combination of many methods TOGETHER: Warm baths/soaks or Ice packs Over the counter pain medication Prescription pain medications Topical creams  A.  Warm water  baths or ice packs (30-60 minutes up to 8 times a day, especially after bowel meovements) will help.  Using ice for the first few days can help decrease swelling and bruising,  Use heat such as warm towels, sitz baths, warm baths, warm showers, etc to help relax tight/sore spots and speed recovery.   Some people prefer to use ice alone, heat alone, alternating between ice & heat.  Experiment to what works for you.    It is helpful to take an over-the-counter pain medication continuously for the first few weeks.  This helps people need to use less narcotics Choose one of the following that works best for you: Naproxen (Aleve, etc)  Two 220mg  tabs twice a day Ibuprofen (Advil, etc) Three 200mg  tabs four times a day (every meal & bedtime) Acetaminophen  (Tylenol , etc) 500-650mg  four times a day (every meal & bedtime)  A  prescription for pain medication (such as oxycodone , hydrocodone , etc) should be given to you upon discharge.  Take your pain medication as prescribed.  If you are having problems/concerns with the prescription medicine (does not control pain, nausea, vomiting, rash, itching, etc), please call us  [phone] to see if we need to switch you to a different pain medicine that will work better for you and/or control your side effect better. Most people will need 1-2 refills to get through the first couple of weeks when pain is the most  intense.  If you need a refill on your pain medication, please contact your pharmacy.  They will contact our office to request authorization. Prescriptions will not be filled after 5 pm or on week-ends.  If can take up to 48 hours for it to be filled & ready so avoid waiting until you  are down to thel ast pill.  A numbing topical cream (Dibucaine) may be given to you.  Many people find relief with topical creams.  Some people find it burns too much.  Experiment.  If it helps, use it.  If it burns, stop using it.  You also may receive a prescription for diazepam , a muscle relaxant to help you to be able to urinate at first easily.  It is safe to take a few doses with the other medications as long as you are not planning to drive or do anything intense.  Hopefully this can minimize the chance of needing a Foley catheter into your bladder     Have a bowel movement every day  Until you have a bowel movement after surgery, he will struggle with urinating and controlling her pain.  Take a fiber supplement (such as Metamucil, Citrucel, FiberCon, MiraLax , etc) 2-4 doses a day to help prevent constipation from occurring.     If you have not had a bowel movement the second postoperative day:  -switch to drinking liquids only -take MiraLAX  double dose every 2 hours x 3 or until you have a BM -If that does not work x 3 doses, increase to 4 doses every 2 hours (Like a small bowel prep) until you have a bowel movement. -Avoid enemas or suppositories (can harm sutures/repair) -Once you start having bowel movements, adjust your fiber supplement or Miralax  dosing back down until you are having 1-2 soft well formed BMs a day.  (Start at 2 doses a day & adjust up or down to avoid diarrhea or recurrent constipation)   Watch out for diarrhea.  If you have many loose bowel movements, simplify your diet to bland foods & liquids for a few days.  Stop any stool softeners and decrease your fiber supplement.  Switching to mild anti-diarrheal medications (Kayopectate, Pepto Bismol) can help.  Can try an imodium/loperamide dose.  If this worsens or does not improve, please call us .  Wound Care   a. You have some fluffed cotton on top of the anus to help catch drainage and bleeding.  THERE IS NO  PACKING INSIDE THE RECTUM -Let the cotton fall off with the first bowel movement or shower.  It is okay to reinforce or replace as needed.  Bleeding is common at first and gradually slows down after a few weeks   b. Place soft cotton balls on the anus/wounds and use an absorbent pad in your underwear as needed to catch any drainage and help keep the area.  Try to use cotton balls or pads (regular gauze or toilet paper will stick and pull, causing pain.  Cotton will come off more easily).  OK to try dry powders such as cornstarch or baby powders   c. Keep the area clean and dry.  Bathe / shower every day.  Keep the area clean by showering / bathing over the incision / wound.   It is okay to soak an open wound to help wash it.  Consider using a squeeze bottle filled with warm water  to gently wash the anal area.  Wet wipes or showers / gentle washing after bowel movements  is often less traumatic than regular toilet paper.           D.  Use a Sitz Bath 4-8 times a day for relief  A sitz bath is a warm water  bath taken in the sitting position that covers only the hips and buttocks. It may be used for either healing or hygiene purposes. Sitz baths are also used to relieve pain, itching, or muscle spasms.  Gently cleaned the area and the heat will help lower spasm and offer better pain control.    Fill the bathtub half full with warm water . Sit in the water  and open the drain a little. Turn on the warm water  to keep the tub half full. Keep the water  running constantly. Soak in the water  for 15 to 20 minutes. After the sitz bath, pat the affected area dry first.   d. You will often notice bleeding, especially with bowel movements.  This should slow down by the end of the first week of surgery.  It can take over a month to more fully stop.  Sitting on an ice pack can help.   e. Expect some drainage.  You often will have some blood or yellow drainage with open wounds.  Sometimes she will get a little leaking  of liquid stool until the incision/wounds have fully close down.  This should slow down by the end of the first week of surgery, but you will have occasional bleeding or drainage up to a few months after surgery until all incisions & wounds have closed.  Wear an absorbent pad or soft cotton gauze in your underwear until the drainage stops.  ACTIVITIES as tolerated:    You may resume regular (light) daily activities beginning the next day--such as daily self-care, walking, climbing stairs--gradually increasing activities as tolerated.  If you can walk 30 minutes without difficulty, it is safe to try more intense activity such as jogging, treadmill, bicycling, low-impact aerobics, swimming, etc. Save the most intensive and strenuous activity for last such as sit-ups, heavy lifting, contact sports, etc  Refrain from any heavy lifting or straining until you are off narcotics for pain control.   DO NOT PUSH THROUGH PAIN.  Let pain be your guide: If it hurts to do something, don't do it.  Pain is your body warning you to avoid that activity for another week until the pain goes down. You may drive when you are no longer taking prescription pain medication, you can comfortably sit for long periods of time, and you can safely maneuver your car and apply brakes. You may have sexual intercourse when it is comfortable.   6.  FOLLOW UP in our office Please call [facility] at [phone] to set up an appointment to see your surgeon in the office for a follow-up appointment approximately 3 weeks after your surgery. If tissue is sent for pathology, it usually takes a week for pathology results to come back.  We will make an effort to call you with results as soon as we get them.  If you have not heard back in a week and wish to know the results before your postop visit, please call our office and we will see if we can give feedback on the results Make sure that you call for this appointment the day you arrive home to  ensure a convenient appointment time.  7. IF YOU HAVE DISABILITY OR FAMILY LEAVE FORMS, BRING THEM TO THE OFFICE FOR PROCESSING.  DO NOT GIVE THEM TO YOUR  DOCTOR.        WHEN TO CALL US  [phone]: Poor pain control Reactions / problems with new medications (rash/itching, nausea, etc)  Fever over 101.5 F (38.5 C) Inability to urinate Nausea and/or vomiting Worsening swelling or bruising Continued bleeding from incision. Increased pain, redness, or drainage from the incision  The clinic staff is available to answer your questions during regular business hours (8:30am-5pm).  Please don't hesitate to call and ask to speak to one of our nurses for clinical concerns.   A surgeon from [facility] is always on call at the hospitals   If you have a medical emergency, go to the nearest emergency room or call 911.    [facility] [address], [place], [place]  [address] ? MAIN: [phone] ? TOLL FREE: [phone] ? FAX [phone] [email or web address]  #####################################################    Recommended surgeon followup physical exam schedule for condyloma/warts: -every 6 months until negative exam x2, then -every Year until negative exam x2, then -as needed thereafter   Genital Warts Genital warts are a sexually transmitted infection. They may appear as small bumps on the tissues of the genital area. CAUSES  Genital warts are caused by a virus called human papillomavirus (HPV). HPV is the most common sexually transmitted disease (STD) and infection of the sex organs. This infection is spread by having unprotected sex with an infected person. It can be spread by vaginal, anal, and oral sex. Many people do not know they are infected. They may be infected for years without problems. However, even if they do not have problems, they can unknowingly pass the infection to their sexual  partners. SYMPTOMS  Itching and irritation in the genital area.Anal wart  Warts that bleed. Painful sexual intercourse. DIAGNOSIS  Warts are usually recognized with the naked eye on the vagina, vulva, perineum, anus, and rectum. Certain tests can also diagnose genital warts, such as: A Pap test. A tissue sample (biopsy) exam. Colposcopy. A magnifying tool is used to examine the vagina and cervix. The HPV cells will change color when certain solutions are used. TREATMENT  Warts can be removed by: Applying certain chemicals, such as cantharidin or podophyllin. Liquid nitrogen freezing (cryotherapy). Immunotherapy with candida or trichophyton injections. Laser treatment. Burning with an electrified probe (electrocautery). Interferon injections. Surgery. PREVENTION  HPV vaccination can help prevent HPV infections that cause genital warts and that cause cancer of the cervix. It is recommended that the vaccination be given to people between the ages 66 to 54 years old. The vaccine might not work as well or might not work at all if you already have HPV. It should not be given to pregnant women. HOME CARE INSTRUCTIONS  It is important to follow your caregiver's instructions. The warts will not go away without treatment. Repeat treatments are often needed to get rid of warts. Even after it appears that the warts are gone, the normal tissue underneath often remains infected. Do not try to treat genital warts with medicine used to treat hand warts. This type of medicine is strong and can burn the skin in the genital area, causing more damage. Tell your past and current sexual partner(s) that you have genital warts. They may be infected also and need treatment. Avoid sexual contact while being treated. Do not touch or scratch the warts. The infection may spread to other parts of your body. Women with genital warts should have a cervical cancer check (Pap test) at least  once a year. This type of cancer  is slow-growing and can be cured if found early. Chances of developing cervical cancer are increased with HPV. Inform your obstetrician about your warts in the event of pregnancy. This virus can be passed to the baby's respiratory tract. Discuss this with your caregiver. Use a condom during sexual intercourse. Following treatment, the use of condoms will help prevent reinfection. Ask your caregiver about using over-the-counter anti-itch creams. SEEK MEDICAL CARE IF:  Your treated skin becomes red, swollen, or painful. You have a fever. You feel generally ill. You feel little lumps in and around your genital area. You are bleeding or have painful sexual intercourse. MAKE SURE YOU:  Understand these instructions. Will watch your condition. Will get help right away if you are not doing well or get worse.

## 2023-11-21 ENCOUNTER — Other Ambulatory Visit: Payer: Self-pay | Admitting: Urology

## 2023-11-21 ENCOUNTER — Other Ambulatory Visit: Payer: Self-pay

## 2023-11-21 ENCOUNTER — Encounter (HOSPITAL_COMMUNITY): Payer: Self-pay | Admitting: Surgery

## 2023-11-21 DIAGNOSIS — E349 Endocrine disorder, unspecified: Secondary | ICD-10-CM

## 2023-11-21 LAB — SURGICAL PATHOLOGY

## 2023-11-22 ENCOUNTER — Other Ambulatory Visit: Payer: Self-pay

## 2023-11-22 ENCOUNTER — Emergency Department

## 2023-11-22 ENCOUNTER — Inpatient Hospital Stay
Admission: EM | Admit: 2023-11-22 | Discharge: 2023-11-27 | DRG: 871 | Disposition: A | Discharge status: Home Health | Attending: Internal Medicine | Admitting: Internal Medicine

## 2023-11-22 DIAGNOSIS — K7460 Unspecified cirrhosis of liver: Secondary | ICD-10-CM | POA: Diagnosis not present

## 2023-11-22 DIAGNOSIS — E1165 Type 2 diabetes mellitus with hyperglycemia: Secondary | ICD-10-CM | POA: Diagnosis present

## 2023-11-22 DIAGNOSIS — Z794 Long term (current) use of insulin: Secondary | ICD-10-CM | POA: Diagnosis not present

## 2023-11-22 DIAGNOSIS — S8990XA Unspecified injury of unspecified lower leg, initial encounter: Secondary | ICD-10-CM | POA: Diagnosis not present

## 2023-11-22 DIAGNOSIS — I480 Paroxysmal atrial fibrillation: Secondary | ICD-10-CM | POA: Diagnosis present

## 2023-11-22 DIAGNOSIS — E785 Hyperlipidemia, unspecified: Secondary | ICD-10-CM | POA: Diagnosis present

## 2023-11-22 DIAGNOSIS — Z9884 Bariatric surgery status: Secondary | ICD-10-CM

## 2023-11-22 DIAGNOSIS — R531 Weakness: Principal | ICD-10-CM

## 2023-11-22 DIAGNOSIS — Z951 Presence of aortocoronary bypass graft: Secondary | ICD-10-CM

## 2023-11-22 DIAGNOSIS — M47812 Spondylosis without myelopathy or radiculopathy, cervical region: Secondary | ICD-10-CM | POA: Diagnosis not present

## 2023-11-22 DIAGNOSIS — E1122 Type 2 diabetes mellitus with diabetic chronic kidney disease: Secondary | ICD-10-CM | POA: Diagnosis present

## 2023-11-22 DIAGNOSIS — I132 Hypertensive heart and chronic kidney disease with heart failure and with stage 5 chronic kidney disease, or end stage renal disease: Secondary | ICD-10-CM | POA: Diagnosis present

## 2023-11-22 DIAGNOSIS — R0902 Hypoxemia: Secondary | ICD-10-CM | POA: Diagnosis not present

## 2023-11-22 DIAGNOSIS — Z961 Presence of intraocular lens: Secondary | ICD-10-CM | POA: Diagnosis present

## 2023-11-22 DIAGNOSIS — I872 Venous insufficiency (chronic) (peripheral): Secondary | ICD-10-CM | POA: Diagnosis present

## 2023-11-22 DIAGNOSIS — E860 Dehydration: Secondary | ICD-10-CM | POA: Diagnosis present

## 2023-11-22 DIAGNOSIS — N186 End stage renal disease: Secondary | ICD-10-CM | POA: Diagnosis present

## 2023-11-22 DIAGNOSIS — Z7901 Long term (current) use of anticoagulants: Secondary | ICD-10-CM

## 2023-11-22 DIAGNOSIS — Z1152 Encounter for screening for COVID-19: Secondary | ICD-10-CM | POA: Diagnosis not present

## 2023-11-22 DIAGNOSIS — N1330 Unspecified hydronephrosis: Secondary | ICD-10-CM | POA: Diagnosis not present

## 2023-11-22 DIAGNOSIS — N1832 Chronic kidney disease, stage 3b: Secondary | ICD-10-CM | POA: Diagnosis present

## 2023-11-22 DIAGNOSIS — I6523 Occlusion and stenosis of bilateral carotid arteries: Secondary | ICD-10-CM | POA: Diagnosis not present

## 2023-11-22 DIAGNOSIS — I2489 Other forms of acute ischemic heart disease: Secondary | ICD-10-CM | POA: Diagnosis present

## 2023-11-22 DIAGNOSIS — M1712 Unilateral primary osteoarthritis, left knee: Secondary | ICD-10-CM | POA: Diagnosis not present

## 2023-11-22 DIAGNOSIS — M25462 Effusion, left knee: Secondary | ICD-10-CM | POA: Diagnosis not present

## 2023-11-22 DIAGNOSIS — Z886 Allergy status to analgesic agent status: Secondary | ICD-10-CM

## 2023-11-22 DIAGNOSIS — Z8616 Personal history of COVID-19: Secondary | ICD-10-CM | POA: Diagnosis not present

## 2023-11-22 DIAGNOSIS — E876 Hypokalemia: Secondary | ICD-10-CM | POA: Diagnosis present

## 2023-11-22 DIAGNOSIS — I5022 Chronic systolic (congestive) heart failure: Secondary | ICD-10-CM | POA: Diagnosis present

## 2023-11-22 DIAGNOSIS — E1169 Type 2 diabetes mellitus with other specified complication: Secondary | ICD-10-CM | POA: Diagnosis present

## 2023-11-22 DIAGNOSIS — R609 Edema, unspecified: Secondary | ICD-10-CM | POA: Diagnosis not present

## 2023-11-22 DIAGNOSIS — Z7952 Long term (current) use of systemic steroids: Secondary | ICD-10-CM

## 2023-11-22 DIAGNOSIS — I2721 Secondary pulmonary arterial hypertension: Secondary | ICD-10-CM | POA: Diagnosis present

## 2023-11-22 DIAGNOSIS — M25062 Hemarthrosis, left knee: Secondary | ICD-10-CM | POA: Diagnosis present

## 2023-11-22 DIAGNOSIS — W19XXXA Unspecified fall, initial encounter: Secondary | ICD-10-CM | POA: Diagnosis present

## 2023-11-22 DIAGNOSIS — D631 Anemia in chronic kidney disease: Secondary | ICD-10-CM | POA: Diagnosis not present

## 2023-11-22 DIAGNOSIS — R590 Localized enlarged lymph nodes: Secondary | ICD-10-CM | POA: Diagnosis not present

## 2023-11-22 DIAGNOSIS — Z7985 Long-term (current) use of injectable non-insulin antidiabetic drugs: Secondary | ICD-10-CM

## 2023-11-22 DIAGNOSIS — K921 Melena: Secondary | ICD-10-CM | POA: Diagnosis present

## 2023-11-22 DIAGNOSIS — I517 Cardiomegaly: Secondary | ICD-10-CM | POA: Diagnosis not present

## 2023-11-22 DIAGNOSIS — D849 Immunodeficiency, unspecified: Secondary | ICD-10-CM | POA: Diagnosis present

## 2023-11-22 DIAGNOSIS — G4733 Obstructive sleep apnea (adult) (pediatric): Secondary | ICD-10-CM | POA: Diagnosis present

## 2023-11-22 DIAGNOSIS — R9431 Abnormal electrocardiogram [ECG] [EKG]: Secondary | ICD-10-CM

## 2023-11-22 DIAGNOSIS — Z940 Kidney transplant status: Secondary | ICD-10-CM

## 2023-11-22 DIAGNOSIS — I10 Essential (primary) hypertension: Secondary | ICD-10-CM | POA: Diagnosis present

## 2023-11-22 DIAGNOSIS — I700 Atherosclerosis of aorta: Secondary | ICD-10-CM | POA: Diagnosis present

## 2023-11-22 DIAGNOSIS — D61818 Other pancytopenia: Secondary | ICD-10-CM | POA: Diagnosis not present

## 2023-11-22 DIAGNOSIS — Y92009 Unspecified place in unspecified non-institutional (private) residence as the place of occurrence of the external cause: Secondary | ICD-10-CM

## 2023-11-22 DIAGNOSIS — T8619 Other complication of kidney transplant: Secondary | ICD-10-CM | POA: Diagnosis not present

## 2023-11-22 DIAGNOSIS — S83242A Other tear of medial meniscus, current injury, left knee, initial encounter: Secondary | ICD-10-CM | POA: Diagnosis not present

## 2023-11-22 DIAGNOSIS — N179 Acute kidney failure, unspecified: Secondary | ICD-10-CM | POA: Diagnosis present

## 2023-11-22 DIAGNOSIS — Z6834 Body mass index (BMI) 34.0-34.9, adult: Secondary | ICD-10-CM

## 2023-11-22 DIAGNOSIS — Z91041 Radiographic dye allergy status: Secondary | ICD-10-CM

## 2023-11-22 DIAGNOSIS — Z955 Presence of coronary angioplasty implant and graft: Secondary | ICD-10-CM

## 2023-11-22 DIAGNOSIS — Z8249 Family history of ischemic heart disease and other diseases of the circulatory system: Secondary | ICD-10-CM

## 2023-11-22 DIAGNOSIS — Z992 Dependence on renal dialysis: Secondary | ICD-10-CM

## 2023-11-22 DIAGNOSIS — K625 Hemorrhage of anus and rectum: Secondary | ICD-10-CM | POA: Diagnosis present

## 2023-11-22 DIAGNOSIS — A419 Sepsis, unspecified organism: Principal | ICD-10-CM | POA: Diagnosis present

## 2023-11-22 DIAGNOSIS — R58 Hemorrhage, not elsewhere classified: Secondary | ICD-10-CM

## 2023-11-22 DIAGNOSIS — R338 Other retention of urine: Secondary | ICD-10-CM | POA: Diagnosis present

## 2023-11-22 DIAGNOSIS — Z713 Dietary counseling and surveillance: Secondary | ICD-10-CM

## 2023-11-22 DIAGNOSIS — Y830 Surgical operation with transplant of whole organ as the cause of abnormal reaction of the patient, or of later complication, without mention of misadventure at the time of the procedure: Secondary | ICD-10-CM | POA: Diagnosis present

## 2023-11-22 DIAGNOSIS — D84821 Immunodeficiency due to drugs: Secondary | ICD-10-CM | POA: Diagnosis present

## 2023-11-22 DIAGNOSIS — E119 Type 2 diabetes mellitus without complications: Secondary | ICD-10-CM | POA: Diagnosis present

## 2023-11-22 DIAGNOSIS — Z7409 Other reduced mobility: Secondary | ICD-10-CM | POA: Diagnosis present

## 2023-11-22 DIAGNOSIS — N178 Other acute kidney failure: Secondary | ICD-10-CM | POA: Diagnosis not present

## 2023-11-22 DIAGNOSIS — N401 Enlarged prostate with lower urinary tract symptoms: Secondary | ICD-10-CM | POA: Diagnosis present

## 2023-11-22 DIAGNOSIS — Z974 Presence of external hearing-aid: Secondary | ICD-10-CM

## 2023-11-22 DIAGNOSIS — Z888 Allergy status to other drugs, medicaments and biological substances status: Secondary | ICD-10-CM

## 2023-11-22 DIAGNOSIS — Z79890 Hormone replacement therapy (postmenopausal): Secondary | ICD-10-CM

## 2023-11-22 DIAGNOSIS — M4802 Spinal stenosis, cervical region: Secondary | ICD-10-CM | POA: Diagnosis not present

## 2023-11-22 DIAGNOSIS — Z560 Unemployment, unspecified: Secondary | ICD-10-CM

## 2023-11-22 DIAGNOSIS — Z79899 Other long term (current) drug therapy: Secondary | ICD-10-CM

## 2023-11-22 DIAGNOSIS — R0989 Other specified symptoms and signs involving the circulatory and respiratory systems: Secondary | ICD-10-CM | POA: Diagnosis not present

## 2023-11-22 DIAGNOSIS — I2510 Atherosclerotic heart disease of native coronary artery without angina pectoris: Secondary | ICD-10-CM | POA: Diagnosis present

## 2023-11-22 DIAGNOSIS — Q686 Discoid meniscus: Secondary | ICD-10-CM | POA: Diagnosis not present

## 2023-11-22 DIAGNOSIS — Z833 Family history of diabetes mellitus: Secondary | ICD-10-CM

## 2023-11-22 DIAGNOSIS — E66812 Obesity, class 2: Secondary | ICD-10-CM | POA: Diagnosis present

## 2023-11-22 DIAGNOSIS — E1129 Type 2 diabetes mellitus with other diabetic kidney complication: Secondary | ICD-10-CM | POA: Diagnosis present

## 2023-11-22 DIAGNOSIS — R161 Splenomegaly, not elsewhere classified: Secondary | ICD-10-CM | POA: Diagnosis not present

## 2023-11-22 DIAGNOSIS — N189 Chronic kidney disease, unspecified: Secondary | ICD-10-CM | POA: Diagnosis not present

## 2023-11-22 DIAGNOSIS — M25469 Effusion, unspecified knee: Secondary | ICD-10-CM | POA: Diagnosis not present

## 2023-11-22 DIAGNOSIS — R310 Gross hematuria: Secondary | ICD-10-CM | POA: Diagnosis present

## 2023-11-22 DIAGNOSIS — M109 Gout, unspecified: Secondary | ICD-10-CM | POA: Diagnosis present

## 2023-11-22 DIAGNOSIS — Z841 Family history of disorders of kidney and ureter: Secondary | ICD-10-CM

## 2023-11-22 DIAGNOSIS — Z87440 Personal history of urinary (tract) infections: Secondary | ICD-10-CM

## 2023-11-22 DIAGNOSIS — I255 Ischemic cardiomyopathy: Secondary | ICD-10-CM | POA: Diagnosis present

## 2023-11-22 LAB — URINALYSIS, W/ REFLEX TO CULTURE (INFECTION SUSPECTED)
Bacteria, UA: NONE SEEN
Bilirubin Urine: NEGATIVE
Glucose, UA: 50 mg/dL — AB
Ketones, ur: NEGATIVE mg/dL
Leukocytes,Ua: NEGATIVE
Nitrite: NEGATIVE
Protein, ur: NEGATIVE mg/dL
Specific Gravity, Urine: 1.012 (ref 1.005–1.030)
Squamous Epithelial / HPF: 0 /HPF (ref 0–5)
pH: 5 (ref 5.0–8.0)

## 2023-11-22 LAB — COMPREHENSIVE METABOLIC PANEL WITH GFR
ALT: 16 U/L (ref 0–44)
AST: 18 U/L (ref 15–41)
Albumin: 3.5 g/dL (ref 3.5–5.0)
Alkaline Phosphatase: 136 U/L — ABNORMAL HIGH (ref 38–126)
Anion gap: 13 (ref 5–15)
BUN: 91 mg/dL — ABNORMAL HIGH (ref 8–23)
CO2: 27 mmol/L (ref 22–32)
Calcium: 8.3 mg/dL — ABNORMAL LOW (ref 8.9–10.3)
Chloride: 97 mmol/L — ABNORMAL LOW (ref 98–111)
Creatinine, Ser: 3.87 mg/dL — ABNORMAL HIGH (ref 0.61–1.24)
GFR, Estimated: 17 mL/min — ABNORMAL LOW (ref >60)
Glucose, Bld: 157 mg/dL — ABNORMAL HIGH (ref 70–99)
Potassium: 4.1 mmol/L (ref 3.5–5.1)
Sodium: 137 mmol/L (ref 135–145)
Total Bilirubin: 0.4 mg/dL (ref 0.0–1.2)
Total Protein: 6.8 g/dL (ref 6.5–8.1)

## 2023-11-22 LAB — AMMONIA: Ammonia: 21 umol/L (ref 9–35)

## 2023-11-22 LAB — CBC WITH DIFFERENTIAL/PLATELET
Abs Immature Granulocytes: 0.03 K/uL (ref 0.00–0.07)
Basophils Absolute: 0 K/uL (ref 0.0–0.1)
Basophils Relative: 0 %
Eosinophils Absolute: 0 K/uL (ref 0.0–0.5)
Eosinophils Relative: 0 %
HCT: 33.3 % — ABNORMAL LOW (ref 39.0–52.0)
Hemoglobin: 9.9 g/dL — ABNORMAL LOW (ref 13.0–17.0)
Immature Granulocytes: 1 %
Lymphocytes Relative: 10 %
Lymphs Abs: 0.5 K/uL — ABNORMAL LOW (ref 0.7–4.0)
MCH: 29 pg (ref 26.0–34.0)
MCHC: 29.7 g/dL — ABNORMAL LOW (ref 30.0–36.0)
MCV: 97.7 fL (ref 80.0–100.0)
Monocytes Absolute: 0.4 K/uL (ref 0.1–1.0)
Monocytes Relative: 8 %
Neutro Abs: 3.7 K/uL (ref 1.7–7.7)
Neutrophils Relative %: 81 %
Platelets: 93 K/uL — ABNORMAL LOW (ref 150–400)
RBC: 3.41 MIL/uL — ABNORMAL LOW (ref 4.22–5.81)
RDW: 15.2 % (ref 11.5–15.5)
WBC: 4.6 K/uL (ref 4.0–10.5)
nRBC: 0 % (ref 0.0–0.2)

## 2023-11-22 LAB — BLOOD GAS, VENOUS
Acid-Base Excess: 1.9 mmol/L (ref 0.0–2.0)
Bicarbonate: 30.5 mmol/L — ABNORMAL HIGH (ref 20.0–28.0)
O2 Saturation: 68.5 %
Patient temperature: 37
pCO2, Ven: 68 mmHg — ABNORMAL HIGH (ref 44–60)
pH, Ven: 7.26 (ref 7.25–7.43)
pO2, Ven: 44 mmHg (ref 32–45)

## 2023-11-22 LAB — TROPONIN I (HIGH SENSITIVITY)
Troponin I (High Sensitivity): 85 ng/L — ABNORMAL HIGH (ref <18)
Troponin I (High Sensitivity): 104 ng/L (ref <18)

## 2023-11-22 LAB — PROTIME-INR
INR: 1.7 — ABNORMAL HIGH (ref 0.8–1.2)
Prothrombin Time: 20.4 s — ABNORMAL HIGH (ref 11.4–15.2)

## 2023-11-22 LAB — RESP PANEL BY RT-PCR (RSV, FLU A&B, COVID)  RVPGX2
Influenza A by PCR: NEGATIVE
Influenza B by PCR: NEGATIVE
Resp Syncytial Virus by PCR: NEGATIVE
SARS Coronavirus 2 by RT PCR: NEGATIVE

## 2023-11-22 LAB — GLUCOSE, CAPILLARY
Glucose-Capillary: 126 mg/dL — ABNORMAL HIGH (ref 70–99)
Glucose-Capillary: 166 mg/dL — ABNORMAL HIGH (ref 70–99)

## 2023-11-22 LAB — LACTIC ACID, PLASMA
Lactic Acid, Venous: 2.1 mmol/L (ref 0.5–1.9)
Lactic Acid, Venous: 1.3 mmol/L (ref 0.5–1.9)

## 2023-11-22 MED ORDER — INSULIN ASPART 100 UNIT/ML IJ SOLN
0.0000 [IU] | Freq: Three times a day (TID) | INTRAMUSCULAR | Status: DC
  Administered 2023-11-23 – 2023-11-25 (×3): 1 [IU] via SUBCUTANEOUS
  Administered 2023-11-26: 2 [IU] via SUBCUTANEOUS
  Administered 2023-11-26: 1 [IU] via SUBCUTANEOUS
  Administered 2023-11-26 – 2023-11-27 (×2): 2 [IU] via SUBCUTANEOUS
  Filled 2023-11-22 (×7): qty 1

## 2023-11-22 MED ORDER — SODIUM CHLORIDE 0.9 % IV BOLUS: 1000.0000 mL | Freq: Once | INTRAVENOUS | Status: DC

## 2023-11-22 MED ORDER — INSULIN GLARGINE 100 UNIT/ML ~~LOC~~ SOLN
14.0000 [IU] | Freq: Every day | SUBCUTANEOUS | Status: DC
  Administered 2023-11-22 – 2023-11-26 (×5): 14 [IU] via SUBCUTANEOUS
  Filled 2023-11-22 (×5): qty 0.14

## 2023-11-22 MED ORDER — COLCHICINE 0.6 MG PO TABS
0.6000 mg | ORAL_TABLET | ORAL | Status: DC
  Administered 2023-11-23 – 2023-11-27 (×3): 0.6 mg via ORAL
  Filled 2023-11-22 (×4): qty 1

## 2023-11-22 MED ORDER — HEPARIN SODIUM (PORCINE) 5000 UNIT/ML IJ SOLN: 5000.0000 [IU] | Freq: Three times a day (TID) | INTRAMUSCULAR | Status: DC

## 2023-11-22 MED ORDER — TACROLIMUS 1 MG PO CAPS
2.0000 mg | ORAL_CAPSULE | Freq: Every morning | ORAL | Status: DC
  Administered 2023-11-23 – 2023-11-27 (×5): 2 mg via ORAL
  Filled 2023-11-22 (×6): qty 2

## 2023-11-22 MED ORDER — TACROLIMUS 1 MG PO CAPS
1.0000 mg | ORAL_CAPSULE | Freq: Every evening | ORAL | Status: DC
Start: 2023-11-22 — End: 2023-11-27
  Administered 2023-11-22 – 2023-11-26 (×5): 1 mg via ORAL
  Filled 2023-11-22 (×6): qty 1

## 2023-11-22 MED ORDER — ACETAMINOPHEN 650 MG RE SUPP: 650.0000 mg | Freq: Four times a day (QID) | RECTAL | Status: DC | PRN

## 2023-11-22 MED ORDER — SODIUM CHLORIDE 0.9 % IV SOLN: 2.0000 g | Freq: Once | INTRAVENOUS | Status: DC

## 2023-11-22 MED ORDER — MONTELUKAST SODIUM 10 MG PO TABS
10.0000 mg | ORAL_TABLET | Freq: Every day | ORAL | Status: DC
  Administered 2023-11-22 – 2023-11-26 (×5): 10 mg via ORAL
  Filled 2023-11-22 (×5): qty 1

## 2023-11-22 MED ORDER — POLYETHYLENE GLYCOL 3350 17 G PO PACK: 17.0000 g | PACK | Freq: Every day | ORAL | Status: DC | PRN

## 2023-11-22 MED ORDER — METRONIDAZOLE 500 MG/100ML IV SOLN
500.0000 mg | Freq: Once | INTRAVENOUS | Status: AC
  Administered 2023-11-22: 500 mg via INTRAVENOUS
  Filled 2023-11-22: qty 100

## 2023-11-22 MED ORDER — BUPROPION HCL ER (SR) 150 MG PO TB12
150.0000 mg | ORAL_TABLET | Freq: Two times a day (BID) | ORAL | Status: DC
  Administered 2023-11-22 – 2023-11-27 (×11): 150 mg via ORAL
  Filled 2023-11-22 (×11): qty 1

## 2023-11-22 MED ORDER — VANCOMYCIN HCL IN DEXTROSE 1-5 GM/200ML-% IV SOLN: 1000.0000 mg | Freq: Once | INTRAVENOUS | Status: DC

## 2023-11-22 MED ORDER — INSULIN GLARGINE 100 UNITS/ML SOLOSTAR PEN: 14.0000 [IU] | PEN_INJECTOR | Freq: Every day | SUBCUTANEOUS | Status: DC

## 2023-11-22 MED ORDER — METRONIDAZOLE 500 MG/100ML IV SOLN
500.0000 mg | Freq: Two times a day (BID) | INTRAVENOUS | Status: DC
  Administered 2023-11-22 – 2023-11-25 (×5): 500 mg via INTRAVENOUS
  Filled 2023-11-22 (×6): qty 100

## 2023-11-22 MED ORDER — SODIUM CHLORIDE 0.9 % IV BOLUS
500.0000 mL | Freq: Once | INTRAVENOUS | Status: AC
  Administered 2023-11-22: 500 mL via INTRAVENOUS

## 2023-11-22 MED ORDER — SULFAMETHOXAZOLE-TRIMETHOPRIM 400-80 MG PO TABS: 1.0000 | ORAL_TABLET | ORAL | Status: DC

## 2023-11-22 MED ORDER — VANCOMYCIN VARIABLE DOSE PER UNSTABLE RENAL FUNCTION (PHARMACIST DOSING): Status: DC

## 2023-11-22 MED ORDER — SODIUM CHLORIDE 0.9 % IV SOLN
2.0000 g | INTRAVENOUS | Status: DC
  Administered 2023-11-23: 2 g via INTRAVENOUS
  Filled 2023-11-22 (×2): qty 12.5

## 2023-11-22 MED ORDER — SODIUM CHLORIDE 0.9 % IV SOLN
2.0000 g | Freq: Once | INTRAVENOUS | Status: AC
  Administered 2023-11-22: 2 g via INTRAVENOUS
  Filled 2023-11-22: qty 12.5

## 2023-11-22 MED ORDER — PREDNISONE 10 MG PO TABS
5.0000 mg | ORAL_TABLET | Freq: Every day | ORAL | Status: DC
  Administered 2023-11-22 – 2023-11-27 (×6): 5 mg via ORAL
  Filled 2023-11-22 (×6): qty 1

## 2023-11-22 MED ORDER — SODIUM CHLORIDE 0.9 % IV BOLUS: 500.0000 mL | Freq: Once | INTRAVENOUS | Status: DC

## 2023-11-22 MED ORDER — LACTATED RINGERS IV BOLUS (SEPSIS): 500.0000 mL | Freq: Once | INTRAVENOUS | Status: DC

## 2023-11-22 MED ORDER — ALBUTEROL SULFATE (2.5 MG/3ML) 0.083% IN NEBU: 2.5000 mg | INHALATION_SOLUTION | Freq: Four times a day (QID) | RESPIRATORY_TRACT | Status: DC | PRN

## 2023-11-22 MED ORDER — INSULIN ASPART 100 UNIT/ML IJ SOLN
0.0000 [IU] | Freq: Every day | INTRAMUSCULAR | Status: DC
  Administered 2023-11-26: 2 [IU] via SUBCUTANEOUS
  Filled 2023-11-22: qty 1

## 2023-11-22 MED ORDER — LACTATED RINGERS IV SOLN
150.0000 mL/h | INTRAVENOUS | Status: DC
  Administered 2023-11-22 – 2023-11-23 (×3): 150 mL/h via INTRAVENOUS

## 2023-11-22 MED ORDER — VANCOMYCIN HCL IN DEXTROSE 1-5 GM/200ML-% IV SOLN
1000.0000 mg | Freq: Once | INTRAVENOUS | Status: AC
  Administered 2023-11-22: 1000 mg via INTRAVENOUS
  Filled 2023-11-22: qty 200

## 2023-11-22 MED ORDER — VANCOMYCIN HCL 1250 MG/250ML IV SOLN
1250.0000 mg | Freq: Once | INTRAVENOUS | Status: AC
  Administered 2023-11-22: 1250 mg via INTRAVENOUS
  Filled 2023-11-22: qty 250

## 2023-11-22 MED ORDER — ACETAMINOPHEN 325 MG PO TABS
650.0000 mg | ORAL_TABLET | Freq: Four times a day (QID) | ORAL | Status: DC | PRN
Start: 2023-11-22 — End: 2023-11-27
  Administered 2023-11-23 – 2023-11-26 (×2): 650 mg via ORAL
  Filled 2023-11-22 (×2): qty 2

## 2023-11-22 MED ORDER — APIXABAN 5 MG PO TABS
5.0000 mg | ORAL_TABLET | Freq: Two times a day (BID) | ORAL | Status: DC
  Administered 2023-11-22 – 2023-11-26 (×8): 5 mg via ORAL
  Filled 2023-11-22 (×8): qty 1

## 2023-11-22 MED ORDER — ROSUVASTATIN CALCIUM 10 MG PO TABS
10.0000 mg | ORAL_TABLET | Freq: Every day | ORAL | Status: DC
  Administered 2023-11-22 – 2023-11-27 (×6): 10 mg via ORAL
  Filled 2023-11-22 (×7): qty 1

## 2023-11-22 NOTE — Consult Note (Signed)
 CODE SEPSIS - PHARMACY COMMUNICATION  **Broad Spectrum Antibiotics should be administered within 1 hour of Sepsis diagnosis**  Time Code Sepsis Called/Page Received: 1114  Antibiotics Ordered:  Cefepime  2g x1, flagyl  500mg  x1, vancomycin  1g x1  Time of 1st antibiotic administration: 1130    Leonor JAYSON Argyle ,PharmD PGY1 11/22/2023  11:32 AM

## 2023-11-22 NOTE — ED Triage Notes (Signed)
 Pt to ED with wife for weakness since yesterday. Pt slid out of recliner yesterday and twice today and could not get up today. Pt had rectal biopsy 3 days ago. Restricted on L arm. Hx kidney transplant, immunosuppressed. BP 88/48, bringing to room now.

## 2023-11-22 NOTE — ED Provider Notes (Signed)
 [name] Provider Note    Event Date/Time   First MD Initiated Contact with Patient 11/22/23 1036     (approximate)   History   Weakness   HPI  [name]. is a 64 y.o. male with history of ESRD status post renal transplant on immunosuppression, history of CHF on torsemide , A-fib on Eliquis , CAD, presenting with generalized weakness.  Per wife, patient slid out of his recliner today.  Has been very weak, has slipped out of his recliner multiple times the last couple days.  No fever or nausea or vomiting.  He denies any diarrhea or urinary symptoms, no chest pain or shortness of breath.  He denies any pain anywhere.  Wife noted that he had a biopsy of his rectum 3 days ago and has been having bloody stools since then.  Says no increase in bloody stools that she is noted.  He denies any focal weakness or numbness, no slurred speech, no vision changes.  Patient states his blood pressure tends to be in the 100 systolics to 140s.  Independent history obtained from wife as above.  Wife states that the rectal bleeding is normal for him post surgery since he has had this done before.  Is typically the bleeding last for a week for him.  Wife states that he does have a left shoulder fracture from a prior fall in June.  Wife states that he is not on any oxygen  at baseline.  Wears CPAP at night.  On independent review, he had an anal wart ablation, hemorrhoid ligation and hemorrhoidectomy with general surgery on 28 August.  He had an echo done in July that showed an EF of 40 to 45%.     Physical Exam   Triage Vital Signs: ED Triage Vitals  Encounter Vitals Group     BP 11/22/23 1032 (!) 88/48     Girls Systolic BP Percentile --      Girls Diastolic BP Percentile --      Boys Systolic BP Percentile --      Boys Diastolic BP Percentile --      Pulse Rate 11/22/23 1032 (!) 24     Resp 11/22/23 1032 20     Temp 11/22/23 1032 98.4 F (36.9 C)     Temp  Source 11/22/23 1032 Oral     SpO2 11/22/23 1032 94 %     Weight 11/22/23 1034 236 lb 15.9 oz (107.5 kg)     Height 11/22/23 1034 5' 10 (1.778 m)     Head Circumference --      Peak Flow --      Pain Score 11/22/23 1033 0     Pain Loc --      Pain Education --      Exclude from Growth Chart --     Most recent vital signs: Vitals:   11/22/23 1032 11/22/23 1130  BP: (!) 88/48 (!) 88/55  Pulse: (!) 24 72  Resp: 20 14  Temp: 98.4 F (36.9 C)   SpO2: 94% 100%     General: Sleeping but awakes easily to voice, no distress.  CV:  Good peripheral perfusion.  Resp:  Normal effort.  No tachypnea or respiratory distress, was noted to be hypoxic to the high 70s, low 80s, placed on 4 L nasal cannula. Abd:  No distention.  Soft nontender Other:  Dry oral mucosa, no palpable skull deformities or tenderness, no facial deformities or tenderness, no midline spinal tenderness,  he does have some tenderness to his left shoulder, no focal weakness or numbness, he has an AV fistula to his left upper extremity with palpable thrill.  Equal radial pulses bilaterally.  Rectal exam was done as chaperone, he has trace bloody mucosa, he does have some erythema around his rectum that is tender but not pain out of proportion, no palpable crepitus, he does have an area of ecchymoses and tenderness to his left posterior hip and lower paralumbar region.  No tenderness to the rest of his extremities.  No nuchal rigidity.   ED Results / Procedures / Treatments   Labs (all labs ordered are listed, but only abnormal results are displayed) Labs Reviewed  LACTIC ACID, PLASMA - Abnormal; Notable for the following components:      Result Value   Lactic Acid, Venous 2.1 (*)    All other components within normal limits  CBC WITH DIFFERENTIAL/PLATELET - Abnormal; Notable for the following components:   RBC 3.41 (*)    Hemoglobin 9.9 (*)    HCT 33.3 (*)    MCHC 29.7 (*)    Platelets 93 (*)    Lymphs Abs 0.5 (*)     All other components within normal limits  URINALYSIS, W/ REFLEX TO CULTURE (INFECTION SUSPECTED) - Abnormal; Notable for the following components:   Color, Urine YELLOW (*)    APPearance CLEAR (*)    Glucose, UA 50 (*)    Hgb urine dipstick SMALL (*)    All other components within normal limits  BLOOD GAS, VENOUS - Abnormal; Notable for the following components:   pCO2, Ven 68 (*)    Bicarbonate 30.5 (*)    All other components within normal limits  COMPREHENSIVE METABOLIC PANEL WITH GFR - Abnormal; Notable for the following components:   Chloride 97 (*)    Glucose, Bld 157 (*)    BUN 91 (*)    Creatinine, Ser 3.87 (*)    Calcium  8.3 (*)    Alkaline Phosphatase 136 (*)    GFR, Estimated 17 (*)    All other components within normal limits  PROTIME-INR - Abnormal; Notable for the following components:   Prothrombin Time 20.4 (*)    INR 1.7 (*)    All other components within normal limits  TROPONIN I (HIGH SENSITIVITY) - Abnormal; Notable for the following components:   Troponin I (High Sensitivity) 85 (*)    All other components within normal limits  RESP PANEL BY RT-PCR (RSV, FLU A&B, COVID)  [id number]  CULTURE, BLOOD (ROUTINE X 2)  CULTURE, BLOOD (ROUTINE X 2)  LACTIC ACID, PLASMA  AMMONIA  TROPONIN I (HIGH SENSITIVITY)     EKG  EKG shows, atrial fibrillation, rate 84, widened QRS, QTc is prolonged, PVC noted, no obvious ischemic ST elevation, intraventricular conduction delay noted.  T wave flattening in aVL, intraventricular conduction delay is old compared to prior, QTc was 490 previously.   RADIOLOGY On my independent interpretation, CT head without obvious intracranial hemorrhage   PROCEDURES:  Critical Care performed: Yes, see critical care procedure note(s)  .Critical Care  Performed by: Waymond Lorelle Cummins, MD Authorized by: Waymond Lorelle Cummins, MD   Critical care provider statement:    Critical care time (minutes):  40   Critical care was necessary to treat or prevent  imminent or life-threatening deterioration of the following conditions:  Respiratory failure   Critical care was time spent personally by me on the following activities:  Development of treatment plan with patient or  surrogate, discussions with consultants, evaluation of patient's response to treatment, examination of patient, ordering and review of laboratory studies, ordering and review of radiographic studies, ordering and performing treatments and interventions, pulse oximetry, re-evaluation of patient's condition and review of old charts    MEDICATIONS ORDERED IN ED: Medications  vancomycin  (VANCOCIN ) IVPB 1000 mg/200 mL premix (1,000 mg Intravenous New Bag/Given 11/22/23 1317)  ceFEPIme  (MAXIPIME ) 2 g in sodium chloride  0.9 % 100 mL IVPB (0 g Intravenous Stopped 11/22/23 1207)  metroNIDAZOLE  (FLAGYL ) IVPB 500 mg (0 mg Intravenous Stopped 11/22/23 1317)  sodium chloride  0.9 % bolus 500 mL (0 mLs Intravenous Stopped 11/22/23 1219)     IMPRESSION / MDM / ASSESSMENT AND PLAN / ED COURSE  I reviewed the triage vital signs and the nursing notes.                              Differential diagnosis includes, but is not limited to, sepsis, infection, electrolyte derangements, dehydration, arrhythmia, atypical ACS, anemia, PE.  For his fall on Eliquis , did consider intracranial hemorrhage, fracture.  Will get labs, EKG, troponin, chest x-ray, blood cultures, lactic acid, UA, CT head, CT cervical spine, CT abdomen pelvis.  CT PE study.  Fluids.  Empiric antibiotics.  He will need to be admitted.  Discussed with patient and family about using IV contrast with CT, he does have history of renal transplant, if his kidney function is worsening and his GFR is low, will avoid the contrast study but if it is normal today, able to proceed with contrasted study.  Patient's presentation is most consistent with acute presentation with potential threat to life or bodily function.  Independent interpretation of labs  and imaging below.  Clinical course as below.  Given the patient's AKI, initial hypotension, and hypoxia he will need to be admitted for further management.  Consult to hospitalist was agreeable with the plan for admission and will evaluate the patient.  He is admitted.  The patient is on the cardiac monitor to evaluate for evidence of arrhythmia and/or significant heart rate changes.   Clinical Course as of 11/22/23 1335  Sat Nov 22, 2023  1118 DG Chest [place] 1 View if patient is in a treatment room. 1. Stable cardiomegaly and pulmonary vascular congestion.  [TT]  1209 Independent review of labs, no leukocytosis, troponin is mildly elevated, lactate is mildly elevated, respiratory viral panel is negative, VBG his pH is 7.26, pCO2 is elevated, he does have an AKI, rest electrolytes not severely deranged, ammonia level is not elevated.  His H&H is stable.  Will switch out CT with IV contrast with CT chest abdomen pelvis without contrast. [TT]  1308 CT Cervical Spine Wo Contrast IMPRESSION: 1. No acute fracture or traumatic malalignment of the cervical spine. 2. Moderate left foraminal stenosis at C5-6 and C6-7 due to uncovertebral spurring. 3. Moderate central canal stenosis at C3-4 and C4-5.   [TT]  1308 CT Head Wo Contrast IMPRESSION: 1. No acute intracranial abnormality. 2. Moderately advanced periventricular and scattered subcortical white matter hypoattenuation, stable from the prior study.   [TT]  1311 On reassessment patient's blood pressures are now systolic 110s.  He had completed his IV fluids, is more awake.  Discussed with him about imaging and lab results so far.  States that he has been drinking less recently as well as peeing less. [TT]  1323 CT CHEST ABDOMEN PELVIS WO CONTRAST IMPRESSION: 1. New small left  pleural effusion and trace right pleural fluid. 2. Age-indeterminate fracture deformity involving the proximal left humerus appears new from 04/08/2023. 3. Morphologic  features of the liver suggestive of cirrhosis. Enlarged spleen measuring 15.4 cm, previously 13.6 cm. 4. Right lower quadrant renal graft without hydronephrosis or mass. 5. Increased subcutaneous soft tissue stranding within the flanks and posterior abdominal wall compatible with anasarca.   [TT]    Clinical Course User Index [TT] Waymond Lorelle Cummins, MD     FINAL CLINICAL IMPRESSION(S) / ED DIAGNOSES   Final diagnoses:  Weakness  Hypoxia  Prolonged Q-T interval on ECG  Fall, initial encounter  Rectal bleeding  Ecchymosis  AKI (acute kidney injury) (HCC)     Rx / DC Orders   ED Discharge Orders     None        Note:  This document was prepared using Dragon voice recognition software and may include unintentional dictation errors.    Waymond Lorelle Cummins, MD 11/22/23 [id number]

## 2023-11-22 NOTE — H&P (Addendum)
 History and Physical    [name]. FMW:[id number] DOB: 1960/04/17 DOA: 11/22/2023  DOS: the patient was seen and examined on 11/22/2023  PCP: [name], MD   Patient coming from: Home  I have personally briefly reviewed patient's old medical records in [facility]  Chief Complaint: Generalized weakness and fall this morning  HPI: [name]. is a pleasant 63 y.o. male with medical history significant for history of ESRD s/p renal transplant in 2012, currently on immunosuppression therapy, history of heart failure last EF was 40 to 45% in July 2025, on torsemide , A-fib on Eliquis , CAD, obesity on CPAP who presented to ED at [facility] today after a fall at home and generalized weakness.  Per patient's wife patient slid down off his recliner this morning.  He has been weak and has been sleeping out of his recliner multiple times last couple days.  There was no history of fever, nausea, vomiting, chest pain, palpitations, shortness of breath.  He denies any diarrhea, dysuria or urinary symptoms.  Wife noted that he had a biopsy of his rectum 3 days ago and also had a hemorrhoid removal then he has been having some bloody stools.  Patient also said that he had blood in his stool.  He was not able to eat and drink well for the last couple days after he had the surgery.  Patient does not wear oxygen  at baseline.  ED Course: Upon arrival to the ED, patient is found to be hypotensive around 88/48, tachypneic at 24, generally weak, hemoglobin 9.9 and hematocrit 33.3 platelets 93, afebrile at 98.4, creatinine 3.87 and BUN 91, baseline creatinine was around 2.  His troponin was 85.  Patient was initiated for sepsis protocol.  COVID and flu was negative, blood cultures were sent.  Patient was started on vancomycin , cefepime  and metronidazole  for unknown sepsis.  Hospitalist service was consulted for evaluation for admission for sepsis and AKI.  Review of Systems:  ROS  All  other systems negative except as noted in the HPI.  Past Medical History:  Diagnosis Date   Anal condyloma    Anemia    Anxiety    Aortic atherosclerosis (HCC)    Aortic stenosis    a.) TTE 10/2021: Mod AS. AVA 1.06cm^2 (VTI). Mean grad ; b.) TTE 04/02/2022: no AV stenosis   Asthma, persistent controlled 02/25/2013   Attention or concentration deficit 04/26/2021   BPH with obstruction/lower urinary tract symptoms 09/25/2014   CAD (coronary artery disease) 2005   a.) LHC 1996 -> HG stenosis mLAD -> PTCA/PCI with stent x 1 (unk type) -> complicated by CFA pseudoanurysm (required surg);  b.) LHC 08/10/2002  -> 95% LAD, 70% ISR LAD, 80% pOM, 70% mRCA --> CVTS consult; c.) 4v CABG 08/13/2002; c.) 03/2018 MV: Small, fixed inferoapical and apical sep defect. No isc. EF 47% (60-65% by 05/2018 TTE); d.) 02/2021 MV: small Apical inf fixed defect. No isc -> low risk.   Cardiomegaly    Cellulitis and abscess of left leg 07/22/2020   Chronic atrial fibrillation    a.) CHA2DS2VASc = 4 (CHF, HTN, vascular disease history, T2DM);  b.) rate/rhythm maintained without pharmacological intervention; chronically anticoagulated with apixaban    Chronic combined systolic and diastolic CHF (congestive heart failure)    a.) TTE 7/18 : EF 45-50%; b.) TTE 05/2018 : EF 60-65%, RVSP 74.8; c.) TTE 12/2018: EF 50-55%. Sev dil LA; d.) TTE 04/2019: EF 45-50%; e.) TTE 07/2021: EF 40-45%, G3DD; f.) TTE 10/2021:  EF 40-45%, mild conc LVH, septal-lat dyssynchrony (LBBB), mildly red RVSF, mild-mod dil LA, mildly dil RA, mild-mod MS, mod AS; g.) TTE 04/02/2022: EF 40-45%, glob HK, LVH, red RVSF, RVE, sev LAE, mild-mod RAE, mild MR   Chronic venous stasis dermatitis of both lower extremities    Cytomegaloviral disease 2017   Diverticulosis of colon 04/22/2013   Elevated RIGHT hemidiaphragm    Erectile dysfunction    a.) on topical TRT + Trimix (alprostadil/papaverine/phentolamine) injections   ESRD (end stage renal disease) (HCC)     a.) dialysis dependent 2004-2012; b.) s/p cadaveric RIGHT renal transplant 09/13/2010   Essential hypertension 12/17/2010   Gout    History of 2019 novel coronavirus disease (COVID-19) 06/30/2019   History of bilateral cataract extraction 10/2017   History of renal transplant 09/13/2010   a.) s/p cadaveric donor transplant 09/13/2010   Hx of bilateral cataract extraction 10/2017   Hyperlipidemia    Hypogonadism in male 09/25/2014   Ischemic cardiomyopathy    Left cephalic vein thrombosis 06/2019   Long term current use of anticoagulant    a.) apixaban    Long term current use of immunosuppressive drug    a.) mycophenolate  + prednisone  + tacrolimus    Major depressive disorder 10/04/2013   Mitral stenosis 11/07/2021   a.) TTE 11/07/2021: severe MAC, mild-mod MS (MPG 6 mmHg); b.) TTE 04/02/2022: no MV stenosis   Murmur    Obesity hypoventilation syndrome 03/31/2012   OSA treated with BiPAP 09/29/2012   PAH (pulmonary artery hypertension)    a. 04/2019 RHC: RA 19, RV 80/20, PA 78/31 (51), PCWP 25, CO/CI 7.69/3.26. PVR 3.25 --> Sev PAH, likely primarily PV HTN; b.) RHC 04/04/2022: mRA 13, mPA 40, mPCWP 20, PA sat 59, AO sat 92, CO 7.63, CI 3.26, PVR 2.6 --> mod portal venous HTN   Pancreatitis    S/P CABG x 4 08/13/2002   a.) LIMA-LAD, LRA-OM, SVG-D1, SVG-dRCA   S/P gastric bypass    Synovitis of finger 06/11/2019   Trigger finger, unspecified little finger 12/23/2018   Type 2 diabetes mellitus with hyperglycemia, with long-term current use of insulin  09/09/2014   a.) has FreeStyle Libre CGM   Ulcer    Left shin goes to wound care center at [facility] qweek   Wears glasses    Wears hearing aid in both ears     Past Surgical History:  Procedure Laterality Date   CARDIAC CATHETERIZATION N/A 08/10/2002   CATARACT EXTRACTION W/PHACO Right 10/29/2017   Procedure: CATARACT EXTRACTION PHACO AND INTRAOCULAR LENS PLACEMENT (IOC)  RIGHT DIABETIC;  Surgeon: Mittie Gaskin, MD;   Location: [facility];  Service: Ophthalmology;  Laterality: Right   CATARACT EXTRACTION W/PHACO Left 11/18/2017   Procedure: CATARACT EXTRACTION PHACO AND INTRAOCULAR LENS PLACEMENT (IOC) LEFT IVA/TOPICAL;  Surgeon: Mittie Gaskin, MD;  Location: [facility];  Service: Ophthalmology;  Laterality: Left   COLONOSCOPY WITH PROPOFOL  N/A 04/22/2013   Procedure: COLONOSCOPY WITH PROPOFOL ;  Surgeon: Toribio SHAUNNA Cedar, MD;  Location: [facility];  Service: Endoscopy;  Laterality: N/A;   CORONARY ANGIOPLASTY WITH STENT PLACEMENT N/A 1996   CORONARY ARTERY BYPASS GRAFT N/A 08/13/2002   Procedure: 4v CORONARY ARTERY BYPASS GRAFTING; Location: [facility]; Surgeon: Dessa Laine, MD   CYSTOSCOPY WITH INSERTION OF UROLIFT N/A 12/25/2021   Procedure: CYSTOSCOPY WITH INSERTION OF UROLIFT;  Surgeon: Twylla Glendia BROCKS, MD;  Location: [facility];  Service: Urology;  Laterality: N/A;   DG ANGIO AV SHUNT*L*     right and left upper arms  FASCIOTOMY  03/03/2012   Procedure: FASCIOTOMY;  Surgeon: Arley JONELLE Curia, MD;  Location: [facility];  Service: Orthopedics;  Laterality: Right;  FASCIOTOMY RIGHT SMALL FINGER   FASCIOTOMY Left 08/17/2013   Procedure: FASCIOTOMY LEFT RING;  Surgeon: Arley JONELLE Curia, MD;  Location: [facility];  Service: Orthopedics;  Laterality: Left;   INCISION AND DRAINAGE ABSCESS Left 10/15/2015   Procedure: INCISION AND DRAINAGE ABSCESS;  Surgeon: Laneta JULIANNA Luna, MD;  Location: [facility];  Service: General;  Laterality: Left;   KIDNEY TRANSPLANT  09/13/2010   cadaver--at [facility]   LASER ABLATION CONDOLAMATA N/A 01/08/2023   Procedure: LASER REMOVAL ABLATION OF CONDYLOMATA;  Surgeon: Sheldon Standing, MD;  Location: [facility];  Service: General;  Laterality: N/A;  GEN AND LOCAL   LASER ABLATION CONDOLAMATA N/A 11/20/2023   Procedure: ABLATION, CONDYLOMA, USING LASER HEMORRHOIDAL LIGATION & HEMORRHOIDOPEXY, HEMORRHOIDECTOMY x 2;  Surgeon: Sheldon Standing, MD;   Location: [facility];  Service: General;  Laterality: N/A;  LASER REMOVAL ABLATION OF CONDYLOMATA ANORECTAL EXAMINATION UNDER ANESTHESIA   RECTAL EXAM UNDER ANESTHESIA N/A 01/08/2023   Procedure: ANORECTAL EXAM UNDER ANESTHESIA;  Surgeon: Sheldon Standing, MD;  Location: [facility];  Service: General;  Laterality: N/A;   RECTAL EXAM UNDER ANESTHESIA N/A 11/20/2023   Procedure: EXAM UNDER ANESTHESIA, RECTUM;  Surgeon: Sheldon Standing, MD;  Location: [facility];  Service: General;  Laterality: N/A;   RIGHT HEART CATH N/A 11/15/2016   Procedure: RIGHT HEART CATH;  Surgeon: Darron Deatrice LABOR, MD;  Location: [facility];  Service: Cardiovascular;  Laterality: N/A;   RIGHT HEART CATH N/A 05/03/2019   Procedure: RIGHT HEART CATH;  Surgeon: [name], MD;  Location: [facility];  Service: Cardiovascular;  Laterality: N/A;   RIGHT HEART CATH N/A 04/04/2022   Procedure: RIGHT HEART CATH;  Surgeon: [name], MD;  Location: [facility] IN[facility]AS[facility] CV LAB;  Service: Cardiovascular;  Laterality: N/A;   ROUX-EN-Y GASTRIC BYPASS N/A 2015   TYMPANIC MEMBRANE REPAIR Left 03/2010   VASECTOMY     WART FULGURATION N/A 05/31/2022   Procedure: FULGURATION ANAL WART;  Surgeon: Tye Millet, DO;  Location: [facility];  Service: General;  Laterality: N/A;     reports that he has never smoked. He has never used smokeless tobacco. He reports that he does not currently use alcohol. He reports that he does not use drugs.  Allergies  Allergen Reactions   Iodinated Contrast Media Other (See Comments)    Kidney transplant  Iodinated contrast media (substance)   Iodine Other (See Comments)    Kidney transplant   Nsaids Other (See Comments)    Kidney transplant   Ibuprofen Other (See Comments)    Due to kidney transplant    Family History  Problem Relation Age of Onset   Heart disease Father    Kidney failure Father    Kidney disease Father    Diabetes Maternal Grandmother    Breast cancer Maternal Grandmother     Valvular heart disease Mother    Liver cancer Paternal Uncle    Liver cancer Paternal Grandmother    Prostate cancer Neg Hx     Prior to Admission medications   Medication Sig Start Date End Date Taking? Authorizing Provider  albuterol  (PROVENTIL ) (2.5 MG/3ML) 0.083% nebulizer solution Take 3 mLs (2.5 mg total) by nebulization every 6 (six) hours as needed for wheezing or shortness of breath. 11/18/22   [name], MD  albuterol  (VENTOLIN  HFA) 108 (  90 Base) MCG/ACT inhaler Inhale 2 puffs into the lungs every 6 (six) hours as needed for wheezing or shortness of breath. 11/18/22   [name], MD  apixaban  (ELIQUIS ) 5 MG TABS tablet Take 1 tablet (5 mg total) by mouth 2 (two) times daily. 10/30/23 10/29/24  [name], MD  buPROPion  (WELLBUTRIN  SR) 150 MG 12 hr tablet Take 1 tablet (150 mg total) by mouth 2 (two) times daily. 05/14/23 05/13/24  [name], MD  colchicine  0.6 MG tablet Take 0.6 mg by mouth every other day.    [provider]  Continuous Glucose Sensor (FREESTYLE LIBRE 3 SENSOR) MISC Use one every 2 weeks. 01/24/23     Cyanocobalamin  (B-12 PO) Take 1 tablet by mouth daily.    [provider]  dapagliflozin  propanediol (FARXIGA ) 10 MG TABS tablet Take 1 tablet (10 mg total) by mouth daily before breakfast. 10/21/22   [name], MD  diazepam  (VALIUM ) 5 MG tablet Take 1 tablet (5 mg total) by mouth every 8 (eight) hours as needed for muscle spasms (difficulty urinating). 11/20/23   Sheldon Standing, MD  febuxostat  (ULORIC ) 40 MG tablet Take 3 tablets (120 mg total) by mouth daily. 07/30/23     fluticasone  (FLONASE ) 50 MCG/ACT nasal spray Place 2 sprays into both nostrils daily. Patient taking differently: Place 2 sprays into both nostrils daily as needed for allergies or rhinitis. 09/09/23   [name], MD  gabapentin  (NEURONTIN ) 300 MG capsule Take 1 capsule (300 mg total) by mouth 2 (two) times daily. 05/16/23     insulin  glargine-yfgn  (SEMGLEE ) 100 UNIT/ML Pen Inject 20 Units into the skin daily. Patient taking differently: Inject 14 Units into the skin daily. 06/16/23     Insulin  Pen Needle (TECHLITE PEN NEEDLES) 31G X 5 MM MISC 4 (four) times daily. 08/21/22   Brendia Calton Squires, PA-C  Insulin  Pen Needle (UNIFINE PENTIPS) 31G X 5 MM MISC Use 4 times daily as directed 05/28/21     losartan  (COZAAR ) 25 MG tablet Take 1 tablet (25 mg total) by mouth daily. 04/21/23   Letvak, Richard I, MD  metolazone  (ZAROXOLYN ) 2.5 MG tablet Take 1 tablet (2.5 mg total) by mouth as directed. By the heart failure clinic Patient taking differently: Take 2.5 mg by mouth daily as needed (Edema). 09/22/23 01/05/24  [name], MD  montelukast  (SINGULAIR ) 10 MG tablet Take 1 tablet (10 mg total) by mouth at bedtime. 01/10/23 01/10/24  [name], MD  mycophenolate  (MYFORTIC ) 180 MG EC tablet Take 2 tablets (360 mg total) by mouth every morning AND 1 tablet (180 mg total) every evening. 04/21/23     omeprazole  (PRILOSEC) 20 MG capsule Take 1 capsule (20 mg total) by mouth daily. 01/09/23 01/09/24  [name], MD  oxyCODONE -acetaminophen  (PERCOCET) 10-325 MG tablet Take 1 tablet by mouth every 6 (six) hours as needed for pain. 11/20/23   Sheldon Standing, MD  potassium chloride  SA (KLOR-CON  M) 20 MEQ tablet Take 1 tablet (20 mEq total) by mouth daily as needed. 07/29/23   Hayes Beckey CROME, NP  predniSONE  (DELTASONE ) 5 MG tablet Take 1 tablet (5 mg total) by mouth daily. 03/12/23     rosuvastatin  (CRESTOR ) 10 MG tablet Take 1 tablet (10 mg total) by mouth daily. 10/30/23   [name], MD  silver  sulfADIAZINE  (SILVADENE ) 1 % cream Apply 1 Application topically daily. Patient not taking: Reported on 11/06/2023 09/12/23   [name], MD  sodium  chloride (OCEAN) 0.65 % SOLN nasal spray Place 2 sprays into both nostrils every 8 (eight) hours for 10 days. Patient taking differently: Place 2 sprays into both nostrils as needed for  congestion. 09/29/23 10/23/24  Franchot Novel, MD  sulfamethoxazole -trimethoprim  (BACTRIM ) 400-80 MG tablet Take 1 tablet by mouth 3 (three) times a week. 02/14/23     tacrolimus  (PROGRAF ) 1 MG capsule Take 2 capsules (2 mg total) by mouth every morning AND 1 capsule (1 mg total) every evening. 03/12/23     tadalafil  (CIALIS ) 20 MG tablet Take 0.5-1 tablets (10-20 mg total) by mouth every other day as needed for erectile dysfunction. Patient not taking: Reported on 11/06/2023 11/04/23   [name], MD  tadalafil  (CIALIS ) 5 MG tablet Take 5 mg by mouth daily as needed for erectile dysfunction.    [provider]  tamsulosin  (FLOMAX ) 0.4 MG CAPS capsule Take 1 capsule (0.4 mg total) by mouth daily. 01/16/23   Vaillancourt, Samantha, PA-C  Testosterone  20.25 MG/ACT (1.62%) GEL Place 2 Pump onto the skin daily. 05/14/23   Helon Kirsch A, PA-C  tirzepatide  (MOUNJARO ) 5 MG/0.5ML Pen Inject 5 mg into the skin once a week. 10/30/23     tiZANidine  (ZANAFLEX ) 2 MG tablet Take 1 tablet (2 mg total) by mouth every 6 (six) hours as needed for muscle spasms. 06/24/23   [name], MD  torsemide  (DEMADEX ) 100 MG tablet Take 1 tablet (100 mg total) by mouth 2 (two) times daily. 10/24/23   Milford, Harlene HERO, FNP  Zinc  50 MG TABS Take 1 tablet (50 mg total) by mouth daily. 04/21/23   [name], MD    Physical Exam: Vitals:   11/22/23 1230 11/22/23 1300 11/22/23 1400 11/22/23 1430  BP: 98/60 113/73 (!) 90/55 106/62  Pulse: 69 (!) 164 72 67  Resp: 13 13 13 16   Temp:      TempSrc:      SpO2: 100% 100% 100% 100%  Weight:      Height:        Physical Exam   Constitutional: Alert, awake, calm, comfortable HEENT: Neck supple Respiratory: Clear to auscultation B/L, no wheezing, no rales.  Cardiovascular: Regular rate and rhythm, no murmurs / rubs / gallops. No extremity edema. 2+ pedal pulses. No carotid bruits.  Abdomen: Soft, no tenderness, Bowel sounds positive.  Morbidly obese  abdomen Musculoskeletal: no clubbing / cyanosis. Good ROM, no contractures. Normal muscle tone.  Skin: Bilateral lower extremity skin changes, 1+ pitting edema Neurologic: CN 2-12 grossly intact. Sensation intact, No focal deficit identified Psychiatric: Alert and oriented x 3. Normal mood.    Labs on Admission: I have personally reviewed following labs and imaging studies  CBC: Recent Labs  Lab 11/22/23 1052  WBC 4.6  NEUTROABS 3.7  HGB 9.9*  HCT 33.3*  MCV 97.7  PLT 93*   Basic Metabolic Panel: Recent Labs  Lab 11/22/23 1140  NA 137  K 4.1  CL 97*  CO2 27  GLUCOSE 157*  BUN 91*  CREATININE 3.87*  CALCIUM  8.3*   GFR: Estimated Creatinine Clearance: 23.7 mL/min (A) (by C-G formula based on SCr of 3.87 mg/dL (H)). Liver Function Tests: Recent Labs  Lab 11/22/23 1140  AST 18  ALT 16  ALKPHOS 136*  BILITOT 0.4  PROT 6.8  ALBUMIN  3.5   No results for input(s): LIPASE, AMYLASE in the last 168 hours. Recent Labs  Lab 11/22/23 1140  AMMONIA 21   Coagulation Profile: Recent Labs  Lab  11/22/23 1140  INR 1.7*   Cardiac Enzymes: Recent Labs  Lab 11/22/23 1052 11/22/23 1311  TROPONINIHS 85* 104*   BNP (last 3 results) Recent Labs    04/08/23 1920 05/02/23 1015 07/29/23 1521  BNP 1,427.4* 443.8* 241.4*   HbA1C: No results for input(s): HGBA1C in the last 72 hours. CBG: Recent Labs  Lab 11/20/23 0550 11/20/23 0904  GLUCAP 174* 169*   Lipid Profile: No results for input(s): CHOL, HDL, LDLCALC, TRIG, CHOLHDL, LDLDIRECT in the last 72 hours. Thyroid  Function Tests: No results for input(s): TSH, T4TOTAL, FREET4, T3FREE, THYROIDAB in the last 72 hours. Anemia Panel: No results for input(s): VITAMINB12, FOLATE, FERRITIN, TIBC, IRON , RETICCTPCT in the last 72 hours. Urine analysis:    Component Value Date/Time   COLORURINE YELLOW (A) 11/22/2023 1311   APPEARANCEUR CLEAR (A) 11/22/2023 1311   APPEARANCEUR  Clear 10/14/2022 0933   LABSPEC 1.012 11/22/2023 1311   PHURINE 5.0 11/22/2023 1311   GLUCOSEU 50 (A) 11/22/2023 1311   HGBUR SMALL (A) 11/22/2023 1311   BILIRUBINUR NEGATIVE 11/22/2023 1311   BILIRUBINUR Negative 10/14/2022 0933   KETONESUR NEGATIVE 11/22/2023 1311   PROTEINUR NEGATIVE 11/22/2023 1311   NITRITE NEGATIVE 11/22/2023 1311   LEUKOCYTESUR NEGATIVE 11/22/2023 1311    Radiological Exams on Admission: I have personally reviewed images CT CHEST ABDOMEN PELVIS WO CONTRAST Result Date: 11/22/2023 EXAM: CT CHEST, ABDOMEN AND PELVIS WITHOUT CONTRAST 11/22/2023 12:51:41 PM TECHNIQUE: CT of the chest, abdomen and pelvis was performed without the administration of intravenous contrast. Multiplanar reformatted images are provided for review. Automated exposure control, iterative reconstruction, and/or weight based adjustment of the mA/kV was utilized to reduce the radiation dose to as low as reasonably achievable. COMPARISON: 04/08/2023 CLINICAL HISTORY: Sepsis. Pt to ED with wife for weakness since yesterday. Pt slid out of recliner yesterday and twice today and could not get up today. Pt had rectal biopsy 3 days ago. Restricted on L arm. Hx kidney transplant, immunosuppressed. BP 88/48, bringing to room now. FINDINGS: CHEST: MEDIASTINUM AND LYMPH NODES: Cardiac enlargement, unchanged. Aortic atherosclerosis. Status post CABG. No pericardial effusion. Thyroid  gland, trachea and esophagus are normal. No enlarged mediastinal or axillary lymph nodes. Calcified mediastinal and hilar lymph nodes compatible with prior granulomatous disease. LUNGS AND PLEURA: New small left pleural effusion. Trace right pleural fluid. Bilateral calcified granulomas identified. No pneumothorax. No airspace consolidation. ABDOMEN AND PELVIS: LIVER: No suspicious liver lesion. Widening of the falciform ligament with relative hypertrophy of the lateral segment of the left lobe and caudate lobe of liver noted. This may be seen  in this setting of early cirrhosis. GALLBLADDER AND BILE DUCTS: Status post cholecystectomy. No bile duct dilatation identified. SPLEEN: The spleen is enlarged measuring 15.4 cm craniocaudal. Previously 13.6 cm. PANCREAS: No acute abnormality. ADRENAL GLANDS: No acute abnormality. KIDNEYS, URETERS AND BLADDER: Bilateral end-stage atrophic kidneys containing multiple calcifications. Hyperdense right kidney lesion is incompletely characterized without IV contrast measuring 1.5 cm. Previously 1.5 cm. This measures 95 Hounsfield units and likely represents a hemorrhagic or proteinaceous cyst. No follow-up imaging recommended. Right lower quadrant renal graft is identified. No hydronephrosis or mass associated with the graft. Bladder is unremarkable. GI AND BOWEL: Previous sleeve gastrectomy. No pathologic dilatation of the large or small bowel loops. The appendix is visualized and appears normal. No signs of bowel inflammation. REPRODUCTIVE ORGANS: No acute abnormality. PERITONEUM AND RETROPERITONEUM: No free fluid or fluid collections within the abdomen or pelvis. No signs of pneumoperitoneum. VASCULATURE: No acute abnormality. ABDOMINAL AND PELVIS  LYMPH NODES: No lymphadenopathy. REPRODUCTIVE ORGANS: No acute abnormality. BONES AND SOFT TISSUES: Status post median sternotomy. Scattered remote rib fractures. Partially imaged subacute to chronic fracture deformity is noted involving the proximal left humerus which appears new from the previous exam. Increased subcutaneous soft tissue stranding within the flanks and posterior abdominal wall compatible with anasarca. IMPRESSION: 1. New small left pleural effusion and trace right pleural fluid. 2. Age-indeterminate fracture deformity involving the proximal left humerus appears new from 04/08/2023. 3. Morphologic features of the liver suggestive of cirrhosis. Enlarged spleen measuring 15.4 cm, previously 13.6 cm. 4. Right lower quadrant renal graft without hydronephrosis or  mass. 5. Increased subcutaneous soft tissue stranding within the flanks and posterior abdominal wall compatible with anasarca. Electronically signed by: Waddell Calk MD 11/22/2023 01:12 PM EDT RP Workstation: [id number]   CT Cervical Spine Wo Contrast Result Date: 11/22/2023 EXAM: CT CERVICAL SPINE WITHOUT CONTRAST 11/22/2023 12:51:41 PM TECHNIQUE: CT of the cervical spine was performed without the administration of intravenous contrast. Multiplanar reformatted images are provided for review. Automated exposure control, iterative reconstruction, and/or weight based adjustment of the mA/kV was utilized to reduce the radiation dose to as low as reasonably achievable. COMPARISON: None available. CLINICAL HISTORY: Fall. Pt to ED with wife for weakness since yesterday. Pt slid out of recliner yesterday and twice today and could not get up today. Pt had rectal biopsy 3 days ago. Restricted on L arm. Hx kidney transplant, immunosuppressed. BP 88/48, bringing to room now. FINDINGS: CERVICAL SPINE: BONES AND ALIGNMENT: Straightening of the normal cervical lordosis is present. No acute fracture or traumatic malalignment. DEGENERATIVE CHANGES: Uncovertebral spurring results in moderate left foraminal stenosis at C5-6 and C6-7. Moderate central canal stenosis is present at C3-4 and C4-5. SOFT TISSUES: Calcified right paratracheal lymph nodes are present. VASCULATURE: Atherosclerotic calcifications are present at the carotid bifurcation bilaterally without definite stenosis. IMPRESSION: 1. No acute fracture or traumatic malalignment of the cervical spine. 2. Moderate left foraminal stenosis at C5-6 and C6-7 due to uncovertebral spurring. 3. Moderate central canal stenosis at C3-4 and C4-5. Electronically signed by: Lonni Necessary MD 11/22/2023 01:05 PM EDT RP Workstation: [id number]   CT Head Wo Contrast Result Date: 11/22/2023 EXAM: CT HEAD WITHOUT CONTRAST 11/22/2023 12:51:41 PM TECHNIQUE: CT of the head was  performed without the administration of intravenous contrast. Automated exposure control, iterative reconstruction, and/or weight based adjustment of the mA/kV was utilized to reduce the radiation dose to as low as reasonably achievable. COMPARISON: CT head without contrast 09/26/2023. CLINICAL HISTORY: Fall. Pt to ED with wife for weakness since yesterday. Pt slid out of recliner yesterday and twice today and could not get up today. Pt had rectal biopsy 3 days ago. Restricted on L arm. Hx kidney transplant, immunosuppressed. BP 88/48, bringing to room now. FINDINGS: BRAIN AND VENTRICLES: No acute hemorrhage. No evidence of acute infarct. No hydrocephalus. No extra-axial collection. No mass effect or midline shift. Periventricular and scattered subcortical white matter hypoattenuation is moderately advanced for age, stable from the prior study. ORBITS: No acute abnormality. SINUSES: Left ethmoid air cells are opacified. The paranasal sinuses and mastoid air cells are otherwise clear. SOFT TISSUES AND SKULL: No acute soft tissue abnormality. No skull fracture. IMPRESSION: 1. No acute intracranial abnormality. 2. Moderately advanced periventricular and scattered subcortical white matter hypoattenuation, stable from the prior study. Electronically signed by: Lonni Necessary MD 11/22/2023 01:00 PM EDT RP Workstation: [id number]   DG Chest Port 1 View if patient is in a treatment room. Result  Date: 11/22/2023 EXAM: 1 VIEW XRAY OF THE CHEST 11/22/2023 10:59:00 AM COMPARISON: 11/12/2023 CLINICAL HISTORY: Suspected Sepsis. Table formatting from the original note was not included.; Pt to ED with wife for weakness since yesterday. Pt slid out of recliner yesterday and twice today and could not get up today. Pt had rectal biopsy 3 days ago. Restricted on L arm. Hx kidney transplant, immunosuppressed. BP 88/48, bringing to room now. FINDINGS: LUNGS AND PLEURA: Stable pulmonary vascular congestion . No focal pulmonary  opacity. No pneumothorax. Possible trace left pleural effusion. HEART AND MEDIASTINUM: Stable cardiomegaly. Calcified right hilar and paratracheal adenopathy. CABG markers. Sternotomy wires. BONES AND SOFT TISSUES: No acute osseous abnormality. IMPRESSION: 1. Stable cardiomegaly and pulmonary vascular congestion. Electronically signed by: Katheleen Faes MD 11/22/2023 11:04 AM EDT RP Workstation: [id number]    EKG: My personal interpretation of EKG shows: A-fib    Assessment/Plan Active Problems:   CAD s/p CABG 2005   Chronic HFrEF (heart failure with reduced ejection fraction) (HCC)   Hypertension   Hyperlipidemia   Renal transplant recipient, 2012   Ischemic cardiomyopathy   OSA (obstructive sleep apnea)   H/O gastric bypass   Type II diabetes mellitus with renal manifestations (HCC)   Immunosuppressed status (HCC)   Sepsis (HCC)    Assessment and Plan: 63 year old male W/PMH of multiple medical problems including but not limited to CAD s/p CABG in 2005, ESRD s/p renal transplant in 2012, ischemic cardiomyopathy with last ejection fraction 40 to 45%, HTN, HLD, OSA on CPAP, DM who came in to ED after generalized weakness, fall from the recliner.  1.  AKI on CKD 3 in a patient with a history of renal transplant on immunosuppressant - Patient had anal/rectal surgery 2 days ago. - Since then patient was not able to eat and drink well - It appears that he has AKI due to poor oral intake/dehydration. - He will be admitted to hospital as inpatient for AKI and possible sepsis. - He has been started on IV fluid and his diuretic torsemide  will be discontinued at this point. - Will avoid nephrotoxic drugs. - Nephrology consult will be called.  2.  Sepsis, unknown etiology - On exam patient appears to be warm but temperature was normal - He was tachycardic and tachypneic - He was given cefepime , vancomycin , Flagyl  for broader coverage for unknown sepsis - He received IV fluid per sepsis  protocol - Cultures were sent - Chest x-ray and urinalysis were negative for acute infection - Will continue to monitor to find etiology.  3.  Generalized weakness/fall - CT scan of the head and cervical spines did not show any fracture or bleeding - Will continue to monitor him hopefully he will get better after  hydration - PT/OT  4.  Atrial fibrillation on Eliquis  - Rate has been fairly controlled - He does not have obvious bleeding - Will continue his home medications including Eliquis  and monitor his heart in telemetry  5.  HFrEF - Does not appear to be in exacerbation - He is in drier side - Will give him gentle hydration for AKI, hold his diuretics for now  6.  Obstructive sleep apnea on CPAP - Continue his home CPAP at home settings  7.  Diabetes - Will continue his Lantus  as well as sliding scale - Continue to monitor blood sugar  8.  History of gout - Continue on close colchicine   9.  Slightly elevated troponin, demand ischemia - May be related to AKI and sepsis, has  a history of congestive heart failure - Continue to monitor    DVT prophylaxis: Eliquis  Code Status: Full Code Family Communication: None available Disposition Plan: Home Consults called: Nephrology Admission status: Inpatient, Telemetry bed   Nena Rebel, MD [facility] 11/22/2023, 2:48 PM

## 2023-11-22 NOTE — Plan of Care (Signed)
  Problem: Metabolic: Goal: Ability to maintain appropriate glucose levels will improve Outcome: Progressing   Problem: Clinical Measurements: Goal: Respiratory complications will improve Outcome: Progressing   Problem: Coping: Goal: Level of anxiety will decrease Outcome: Progressing   Problem: Safety: Goal: Ability to remain free from injury will improve Outcome: Progressing   Problem: Fluid Volume: Goal: Ability to maintain a balanced intake and output will improve Outcome: Not Progressing   Problem: Clinical Measurements: Goal: Will remain free from infection Outcome: Not Progressing   Problem: Elimination: Goal: Will not experience complications related to urinary retention Outcome: Not Progressing

## 2023-11-22 NOTE — Progress Notes (Addendum)
 Pharmacy Antibiotic Note  [name]. is a 62 y.o. male admitted on 11/22/2023 with an infection of unknown source.  Patient presents to ED with generalized weakness. No diarrhea or urinary symptoms reported. Pharmacy has been consulted for vancomycin  and cefepime  dosing x 7 days.  Plan: Give vancomycin  1250 mg IV x1 to complete a loading dose of 2250 mg. Check random vanc level tomorrow given AKI (SCr 3.87, baseline 2-2.4 (11/06/2023)) Start cefepime  2 g IV Q24H x 7 days Patient is also on metronidazole  500 mg IV Q12H x 7 days Continue to monitor renal function and follow culture results   Height: 5' 10 (177.8 cm) Weight: 107.5 kg (236 lb 15.9 oz) IBW/kg (Calculated) : 73  Temp (24hrs), Avg:98.4 F (36.9 C), Min:98.4 F (36.9 C), Max:98.4 F (36.9 C)  Recent Labs  Lab 11/22/23 1052 11/22/23 1140 11/22/23 1311  WBC 4.6  --   --   CREATININE  --  3.87*  --   LATICACIDVEN 2.1*  --  1.3    Estimated Creatinine Clearance: 23.7 mL/min (A) (by C-G formula based on SCr of 3.87 mg/dL (H)).    Allergies  Allergen Reactions   Iodinated Contrast Media Other (See Comments)    Kidney transplant  Iodinated contrast media (substance)   Iodine Other (See Comments)    Kidney transplant   Nsaids Other (See Comments)    Kidney transplant   Ibuprofen Other (See Comments)    Due to kidney transplant    Antimicrobials this admission: 8/30 cefepime  >>  8/30 metronidazole  >>  8/30 vancomycin >>  Microbiology results: 8/30 BCx: in process  Thank you for allowing pharmacy to be a part of this patient's care.  Lum VEAR Mania, PharmD, BCPS 11/22/2023 2:24 PM

## 2023-11-22 NOTE — Progress Notes (Signed)
 Elink following for sepsis protocol.

## 2023-11-23 ENCOUNTER — Inpatient Hospital Stay

## 2023-11-23 DIAGNOSIS — N179 Acute kidney failure, unspecified: Secondary | ICD-10-CM | POA: Diagnosis not present

## 2023-11-23 LAB — CBC
HCT: 34.1 % — ABNORMAL LOW (ref 39.0–52.0)
Hemoglobin: 10 g/dL — ABNORMAL LOW (ref 13.0–17.0)
MCH: 28.2 pg (ref 26.0–34.0)
MCHC: 29.3 g/dL — ABNORMAL LOW (ref 30.0–36.0)
MCV: 96.3 fL (ref 80.0–100.0)
Platelets: 92 K/uL — ABNORMAL LOW (ref 150–400)
RBC: 3.54 MIL/uL — ABNORMAL LOW (ref 4.22–5.81)
RDW: 14.9 % (ref 11.5–15.5)
WBC: 3.9 K/uL — ABNORMAL LOW (ref 4.0–10.5)
nRBC: 0 % (ref 0.0–0.2)

## 2023-11-23 LAB — GLUCOSE, CAPILLARY
Glucose-Capillary: 97 mg/dL (ref 70–99)
Glucose-Capillary: 113 mg/dL — ABNORMAL HIGH (ref 70–99)
Glucose-Capillary: 170 mg/dL — ABNORMAL HIGH (ref 70–99)
Glucose-Capillary: 167 mg/dL — ABNORMAL HIGH (ref 70–99)

## 2023-11-23 LAB — URINALYSIS, ROUTINE W REFLEX MICROSCOPIC
Bilirubin Urine: NEGATIVE
Glucose, UA: 50 mg/dL — AB
Ketones, ur: NEGATIVE mg/dL
Leukocytes,Ua: NEGATIVE
Nitrite: NEGATIVE
Protein, ur: NEGATIVE mg/dL
Specific Gravity, Urine: 1.009 (ref 1.005–1.030)
pH: 5 (ref 5.0–8.0)

## 2023-11-23 LAB — COMPREHENSIVE METABOLIC PANEL WITH GFR
ALT: 14 U/L (ref 0–44)
AST: 14 U/L — ABNORMAL LOW (ref 15–41)
Albumin: 3.3 g/dL — ABNORMAL LOW (ref 3.5–5.0)
Alkaline Phosphatase: 126 U/L (ref 38–126)
Anion gap: 12 (ref 5–15)
BUN: 95 mg/dL — ABNORMAL HIGH (ref 8–23)
CO2: 28 mmol/L (ref 22–32)
Calcium: 8.4 mg/dL — ABNORMAL LOW (ref 8.9–10.3)
Chloride: 100 mmol/L (ref 98–111)
Creatinine, Ser: 3.57 mg/dL — ABNORMAL HIGH (ref 0.61–1.24)
GFR, Estimated: 18 mL/min — ABNORMAL LOW (ref >60)
Glucose, Bld: 131 mg/dL — ABNORMAL HIGH (ref 70–99)
Potassium: 3.8 mmol/L (ref 3.5–5.1)
Sodium: 140 mmol/L (ref 135–145)
Total Bilirubin: 0.8 mg/dL (ref 0.0–1.2)
Total Protein: 7 g/dL (ref 6.5–8.1)

## 2023-11-23 LAB — PROTIME-INR
INR: 1.8 — ABNORMAL HIGH (ref 0.8–1.2)
Prothrombin Time: 21.7 s — ABNORMAL HIGH (ref 11.4–15.2)

## 2023-11-23 LAB — HEMOGLOBIN AND HEMATOCRIT, BLOOD
HCT: 32.7 % — ABNORMAL LOW (ref 39.0–52.0)
Hemoglobin: 9.8 g/dL — ABNORMAL LOW (ref 13.0–17.0)

## 2023-11-23 LAB — CORTISOL-AM, BLOOD: Cortisol - AM: 3.9 ug/dL — ABNORMAL LOW (ref 6.7–22.6)

## 2023-11-23 LAB — HIV ANTIBODY (ROUTINE TESTING W REFLEX): HIV Screen 4th Generation wRfx: NONREACTIVE

## 2023-11-23 MED ORDER — TAMSULOSIN HCL 0.4 MG PO CAPS
0.4000 mg | ORAL_CAPSULE | Freq: Every day | ORAL | Status: DC
  Administered 2023-11-23 – 2023-11-27 (×5): 0.4 mg via ORAL
  Filled 2023-11-23 (×5): qty 1

## 2023-11-23 MED ORDER — CHLORHEXIDINE GLUCONATE CLOTH 2 % EX PADS
6.0000 | MEDICATED_PAD | Freq: Every day | CUTANEOUS | Status: DC
  Administered 2023-11-23 – 2023-11-26 (×4): 6 via TOPICAL

## 2023-11-23 MED ORDER — MORPHINE SULFATE (PF) 2 MG/ML IV SOLN
1.0000 mg | INTRAVENOUS | Status: DC | PRN
  Administered 2023-11-23 – 2023-11-26 (×7): 1 mg via INTRAVENOUS
  Filled 2023-11-23 (×7): qty 1

## 2023-11-23 MED ORDER — LACTATED RINGERS IV SOLN: INTRAVENOUS | Status: AC

## 2023-11-23 MED ORDER — OXYCODONE-ACETAMINOPHEN 5-325 MG PO TABS
1.0000 | ORAL_TABLET | Freq: Four times a day (QID) | ORAL | Status: DC | PRN
  Administered 2023-11-23 – 2023-11-26 (×4): 1 via ORAL
  Filled 2023-11-23 (×4): qty 1

## 2023-11-23 MED ORDER — MYCOPHENOLATE SODIUM 180 MG PO TBEC
180.0000 mg | DELAYED_RELEASE_TABLET | Freq: Every evening | ORAL | Status: DC
  Administered 2023-11-23 – 2023-11-26 (×4): 180 mg via ORAL
  Filled 2023-11-23 (×5): qty 1

## 2023-11-23 MED ORDER — MYCOPHENOLATE SODIUM 180 MG PO TBEC
360.0000 mg | DELAYED_RELEASE_TABLET | Freq: Every morning | ORAL | Status: DC
Start: 2023-11-23 — End: 2023-11-27
  Administered 2023-11-23 – 2023-11-27 (×5): 360 mg via ORAL
  Filled 2023-11-23 (×5): qty 2

## 2023-11-23 NOTE — Progress Notes (Addendum)
 PROGRESS NOTE   HPI was taken from Dr. Roann: [name]. is a pleasant 63 y.o. male with medical history significant for history of ESRD s/p renal transplant in 2012, currently on immunosuppression therapy, history of heart failure last EF was 40 to 45% in July 2025, on torsemide , A-fib on Eliquis , CAD, obesity on CPAP who presented to ED at [facility] today after a fall at home and generalized weakness.  Per patient's wife patient slid down off his recliner this morning.  He has been weak and has been sleeping out of his recliner multiple times last couple days.  There was no history of fever, nausea, vomiting, chest pain, palpitations, shortness of breath.  He denies any diarrhea, dysuria or urinary symptoms.  Wife noted that he had a biopsy of his rectum 3 days ago and also had a hemorrhoid removal then he has been having some bloody stools.  Patient also said that he had blood in his stool.  He was not able to eat and drink well for the last couple days after he had the surgery.  Patient does not wear oxygen  at baseline.   ED Course: Upon arrival to the ED, patient is found to be hypotensive around 88/48, tachypneic at 24, generally weak, hemoglobin 9.9 and hematocrit 33.3 platelets 93, afebrile at 98.4, creatinine 3.87 and BUN 91, baseline creatinine was around 2.  His troponin was 85.  Patient was initiated for sepsis protocol.  COVID and flu was negative, blood cultures were sent.  Patient was started on vancomycin , cefepime  and metronidazole  for unknown sepsis.  Hospitalist service was consulted for evaluation for admission for sepsis and AKI.   [name].  FMW:[id number] DOB: 03/03/1961 DOA: 11/22/2023 PCP: [name], MD   Assessment & Plan:   Active Problems:   CAD s/p CABG 2005   Chronic HFrEF (heart failure with reduced ejection fraction) (HCC)   Hypertension   Hyperlipidemia   Renal transplant recipient, 2012   Ischemic cardiomyopathy   OSA (obstructive  sleep apnea)   H/O gastric bypass   Type II diabetes mellitus with renal manifestations (HCC)   Immunosuppressed status (HCC)   Sepsis (HCC)  Assessment and Plan: AKI on CKDIIIb: w/ hx of renal transplant & on immunosuppresants. Continue on IVFs. Holding home bactrim . Continue on home dose of steroids, tacrolimus , mycophenolate . Continue on IV cefepime , flagyl  & vanco. Renal US  ordered. Concern for urinary retention. Bladder scan prn. May need a foley.    Sepsis: met criteria w/ tachycardia, tachypnea but unknown source of infection. CXR was neg for pneumonia. UA was positive for Hbg & glucose. Renal US  ordered. Continue on IV abxs and IVFs.  Left knee pain: fall at home. XR shows small knee joint effusion & mild medial compartment joint space narrowing. Tylenol  prn for pain   Rectal bleeding: w/ recent hx of biopsy of rectum & hemorrhoid removed. Likely etiology of rectal bleeding. Repeat H&H ordered. If continues to bleed/drop in H&H, consider GI consult   Generalized weakness: PT recs SNF. OT consulted   Likely PAF: continue on home dose of eliquis    Chronic systolic CHF: monitor I/Os. Holding home diuretics   OSA: on CPAP   DM2: likely poorly controlled. Continue on glargine, SSI w/ accuchecks    Hx of gout: continue on colchicine    Elevated troponin: likely secondary to demand ischemia   Obesity: BMI 34.0. Would benefit from weight loss    DVT prophylaxis: eliquis  Code Status: full  Family Communication:  Disposition Plan: possibly d/c to SNF.  Level of care: Telemetry Cardiac Consultants:  Nephro   Procedures:   Antimicrobials:    Subjective: Pt c/o left knee pain  Objective: Vitals:   11/23/23 0100 11/23/23 0144 11/23/23 0414 11/23/23 0819  BP:   126/76 120/73  Pulse:   (!) 101 70  Resp:  17 16 17   Temp: 98.4 F (36.9 C)  97.9 F (36.6 C) 98.2 F (36.8 C)  TempSrc: Axillary   Oral  SpO2: (!) 89% 96% 93% 100%  Weight:      Height:         Intake/Output Summary (Last 24 hours) at 11/23/2023 0907 Last data filed at 11/23/2023 0600 Gross per 24 hour  Intake 2040 ml  Output 1177 ml  Net 863 ml   Filed Weights   11/22/23 1034  Weight: 107.5 kg    Examination:  General exam: Appears calm and comfortable  Respiratory system: Clear to auscultation. Respiratory effort normal. Cardiovascular system: S1 & S2 +. No rubs, gallops or clicks Gastrointestinal system: Abdomen is obese, soft and nontender. Normal bowel sounds heard. Central nervous system: Alert and oriented.moves all extremities Psychiatry: Judgement and insight appear normal. Flat mood and affect     Data Reviewed: I have personally reviewed following labs and imaging studies  CBC: Recent Labs  Lab 11/22/23 1052 11/23/23 0453  WBC 4.6 3.9*  NEUTROABS 3.7  --   HGB 9.9* 10.0*  HCT 33.3* 34.1*  MCV 97.7 96.3  PLT 93* 92*   Basic Metabolic Panel: Recent Labs  Lab 11/22/23 1140 11/23/23 0453  NA 137 140  K 4.1 3.8  CL 97* 100  CO2 27 28  GLUCOSE 157* 131*  BUN 91* 95*  CREATININE 3.87* 3.57*  CALCIUM  8.3* 8.4*   GFR: Estimated Creatinine Clearance: 25.7 mL/min (A) (by C-G formula based on SCr of 3.57 mg/dL (H)). Liver Function Tests: Recent Labs  Lab 11/22/23 1140 11/23/23 0453  AST 18 14*  ALT 16 14  ALKPHOS 136* 126  BILITOT 0.4 0.8  PROT 6.8 7.0  ALBUMIN  3.5 3.3*   No results for input(s): LIPASE, AMYLASE in the last 168 hours. Recent Labs  Lab 11/22/23 1140  AMMONIA 21   Coagulation Profile: Recent Labs  Lab 11/22/23 1140 11/23/23 0453  INR 1.7* 1.8*   Cardiac Enzymes: No results for input(s): CKTOTAL, CKMB, CKMBINDEX, TROPONINI in the last 168 hours. BNP (last 3 results) No results for input(s): PROBNP in the last 8760 hours. HbA1C: No results for input(s): HGBA1C in the last 72 hours. CBG: Recent Labs  Lab 11/20/23 0550 11/20/23 0904 11/22/23 1640 11/22/23 2021 11/23/23 0822  GLUCAP 174*  169* 126* 166* 97   Lipid Profile: No results for input(s): CHOL, HDL, LDLCALC, TRIG, CHOLHDL, LDLDIRECT in the last 72 hours. Thyroid  Function Tests: No results for input(s): TSH, T4TOTAL, FREET4, T3FREE, THYROIDAB in the last 72 hours. Anemia Panel: No results for input(s): VITAMINB12, FOLATE, FERRITIN, TIBC, IRON , RETICCTPCT in the last 72 hours. Sepsis Labs: Recent Labs  Lab 11/22/23 1052 11/22/23 1311  LATICACIDVEN 2.1* 1.3    Recent Results (from the past 240 hours)  Culture, blood (Routine x 2)     Status: None (Preliminary result)   Collection Time: 11/22/23 10:52 AM   Specimen: BLOOD RIGHT ARM  Result Value Ref Range Status   Specimen Description BLOOD RIGHT ARM  Final   Special Requests   Final    BOTTLES DRAWN AEROBIC AND ANAEROBIC Blood Culture adequate volume  Culture   Final    NO GROWTH < 24 HOURS Performed at [facility], [address] [place], [place] [address]    Report Status PENDING  Incomplete  Culture, blood (Routine x 2)     Status: None (Preliminary result)   Collection Time: 11/22/23 10:53 AM   Specimen: BLOOD RIGHT HAND  Result Value Ref Range Status   Specimen Description BLOOD RIGHT HAND  Final   Special Requests   Final    BOTTLES DRAWN AEROBIC AND ANAEROBIC Blood Culture adequate volume   Culture   Final    NO GROWTH < 24 HOURS Performed at [facility], [address], [place], [place] [address]    Report Status PENDING  Incomplete  Resp panel by RT-PCR (RSV, Flu A&B, Covid) Anterior Nasal Swab     Status: None   Collection Time: 11/22/23 10:53 AM   Specimen: Anterior Nasal Swab  Result Value Ref Range Status   SARS Coronavirus 2 by RT PCR NEGATIVE NEGATIVE Final    Comment: (NOTE) SARS-CoV-2 target nucleic acids are NOT DETECTED.  The SARS-CoV-2 RNA is generally detectable in upper respiratory specimens during the acute phase of infection. The lowest concentration of SARS-CoV-2  viral copies this assay can detect is 138 copies/mL. A negative result does not preclude SARS-Cov-2 infection and should not be used as the sole basis for treatment or other patient management decisions. A negative result may occur with  improper specimen collection/handling, submission of specimen other than nasopharyngeal swab, presence of viral mutation(s) within the areas targeted by this assay, and inadequate number of viral copies(<138 copies/mL). A negative result must be combined with clinical observations, patient history, and epidemiological information. The expected result is Negative.  Fact Sheet for Patients:  BloggerCourse.com  Fact Sheet for Healthcare Providers:  SeriousBroker.it  This test is no t yet approved or cleared by the [place]  FDA and  has been authorized for detection and/or diagnosis of SARS-CoV-2 by FDA under an Emergency Use Authorization (EUA). This EUA will remain  in effect (meaning this test can be used) for the duration of the COVID-19 declaration under Section 564(b)(1) of the Act, 21 U.S.C.section 360bbb-3(b)(1), unless the authorization is terminated  or revoked sooner.       Influenza A by PCR NEGATIVE NEGATIVE Final   Influenza B by PCR NEGATIVE NEGATIVE Final    Comment: (NOTE) The Xpert Xpress SARS-CoV-2/FLU/RSV plus assay is intended as an aid in the diagnosis of influenza from Nasopharyngeal swab specimens and should not be used as a sole basis for treatment. Nasal washings and aspirates are unacceptable for Xpert Xpress SARS-CoV-2/FLU/RSV testing.  Fact Sheet for Patients: BloggerCourse.com  Fact Sheet for Healthcare Providers: SeriousBroker.it  This test is not yet approved or cleared by the [place]  FDA and has been authorized for detection and/or diagnosis of SARS-CoV-2 by FDA under an Emergency Use Authorization  (EUA). This EUA will remain in effect (meaning this test can be used) for the duration of the COVID-19 declaration under Section 564(b)(1) of the Act, 21 U.S.C. section 360bbb-3(b)(1), unless the authorization is terminated or revoked.     Resp Syncytial Virus by PCR NEGATIVE NEGATIVE Final    Comment: (NOTE) Fact Sheet for Patients: BloggerCourse.com  Fact Sheet for Healthcare Providers: SeriousBroker.it  This test is not yet approved or cleared by the [place]  FDA and has been authorized for detection and/or diagnosis of SARS-CoV-2 by FDA under an Emergency Use Authorization (EUA). This EUA will remain  in effect (meaning this test can be used) for the duration of the COVID-19 declaration under Section 564(b)(1) of the Act, 21 U.S.C. section 360bbb-3(b)(1), unless the authorization is terminated or revoked.  Performed at [facility], [address] [place], [place] [address]          Radiology Studies: CT CHEST ABDOMEN PELVIS WO CONTRAST Result Date: 11/22/2023 EXAM: CT CHEST, ABDOMEN AND PELVIS WITHOUT CONTRAST 11/22/2023 12:51:41 PM TECHNIQUE: CT of the chest, abdomen and pelvis was performed without the administration of intravenous contrast. Multiplanar reformatted images are provided for review. Automated exposure control, iterative reconstruction, and/or weight based adjustment of the mA/kV was utilized to reduce the radiation dose to as low as reasonably achievable. COMPARISON: 04/08/2023 CLINICAL HISTORY: Sepsis. Pt to ED with wife for weakness since yesterday. Pt slid out of recliner yesterday and twice today and could not get up today. Pt had rectal biopsy 3 days ago. Restricted on L arm. Hx kidney transplant, immunosuppressed. BP 88/48, bringing to room now. FINDINGS: CHEST: MEDIASTINUM AND LYMPH NODES: Cardiac enlargement, unchanged. Aortic atherosclerosis. Status post CABG. No pericardial effusion.  Thyroid  gland, trachea and esophagus are normal. No enlarged mediastinal or axillary lymph nodes. Calcified mediastinal and hilar lymph nodes compatible with prior granulomatous disease. LUNGS AND PLEURA: New small left pleural effusion. Trace right pleural fluid. Bilateral calcified granulomas identified. No pneumothorax. No airspace consolidation. ABDOMEN AND PELVIS: LIVER: No suspicious liver lesion. Widening of the falciform ligament with relative hypertrophy of the lateral segment of the left lobe and caudate lobe of liver noted. This may be seen in this setting of early cirrhosis. GALLBLADDER AND BILE DUCTS: Status post cholecystectomy. No bile duct dilatation identified. SPLEEN: The spleen is enlarged measuring 15.4 cm craniocaudal. Previously 13.6 cm. PANCREAS: No acute abnormality. ADRENAL GLANDS: No acute abnormality. KIDNEYS, URETERS AND BLADDER: Bilateral end-stage atrophic kidneys containing multiple calcifications. Hyperdense right kidney lesion is incompletely characterized without IV contrast measuring 1.5 cm. Previously 1.5 cm. This measures 95 Hounsfield units and likely represents a hemorrhagic or proteinaceous cyst. No follow-up imaging recommended. Right lower quadrant renal graft is identified. No hydronephrosis or mass associated with the graft. Bladder is unremarkable. GI AND BOWEL: Previous sleeve gastrectomy. No pathologic dilatation of the large or small bowel loops. The appendix is visualized and appears normal. No signs of bowel inflammation. REPRODUCTIVE ORGANS: No acute abnormality. PERITONEUM AND RETROPERITONEUM: No free fluid or fluid collections within the abdomen or pelvis. No signs of pneumoperitoneum. VASCULATURE: No acute abnormality. ABDOMINAL AND PELVIS LYMPH NODES: No lymphadenopathy. REPRODUCTIVE ORGANS: No acute abnormality. BONES AND SOFT TISSUES: Status post median sternotomy. Scattered remote rib fractures. Partially imaged subacute to chronic fracture deformity is noted  involving the proximal left humerus which appears new from the previous exam. Increased subcutaneous soft tissue stranding within the flanks and posterior abdominal wall compatible with anasarca. IMPRESSION: 1. New small left pleural effusion and trace right pleural fluid. 2. Age-indeterminate fracture deformity involving the proximal left humerus appears new from 04/08/2023. 3. Morphologic features of the liver suggestive of cirrhosis. Enlarged spleen measuring 15.4 cm, previously 13.6 cm. 4. Right lower quadrant renal graft without hydronephrosis or mass. 5. Increased subcutaneous soft tissue stranding within the flanks and posterior abdominal wall compatible with anasarca. Electronically signed by: Waddell Calk MD 11/22/2023 01:12 PM EDT RP Workstation: [id number]   CT Cervical Spine Wo Contrast Result Date: 11/22/2023 EXAM: CT CERVICAL SPINE WITHOUT CONTRAST 11/22/2023 12:51:41 PM TECHNIQUE: CT of the cervical spine was performed without the administration  of intravenous contrast. Multiplanar reformatted images are provided for review. Automated exposure control, iterative reconstruction, and/or weight based adjustment of the mA/kV was utilized to reduce the radiation dose to as low as reasonably achievable. COMPARISON: None available. CLINICAL HISTORY: Fall. Pt to ED with wife for weakness since yesterday. Pt slid out of recliner yesterday and twice today and could not get up today. Pt had rectal biopsy 3 days ago. Restricted on L arm. Hx kidney transplant, immunosuppressed. BP 88/48, bringing to room now. FINDINGS: CERVICAL SPINE: BONES AND ALIGNMENT: Straightening of the normal cervical lordosis is present. No acute fracture or traumatic malalignment. DEGENERATIVE CHANGES: Uncovertebral spurring results in moderate left foraminal stenosis at C5-6 and C6-7. Moderate central canal stenosis is present at C3-4 and C4-5. SOFT TISSUES: Calcified right paratracheal lymph nodes are present. VASCULATURE:  Atherosclerotic calcifications are present at the carotid bifurcation bilaterally without definite stenosis. IMPRESSION: 1. No acute fracture or traumatic malalignment of the cervical spine. 2. Moderate left foraminal stenosis at C5-6 and C6-7 due to uncovertebral spurring. 3. Moderate central canal stenosis at C3-4 and C4-5. Electronically signed by: Lonni Necessary MD 11/22/2023 01:05 PM EDT RP Workstation: [id number]   CT Head Wo Contrast Result Date: 11/22/2023 EXAM: CT HEAD WITHOUT CONTRAST 11/22/2023 12:51:41 PM TECHNIQUE: CT of the head was performed without the administration of intravenous contrast. Automated exposure control, iterative reconstruction, and/or weight based adjustment of the mA/kV was utilized to reduce the radiation dose to as low as reasonably achievable. COMPARISON: CT head without contrast 09/26/2023. CLINICAL HISTORY: Fall. Pt to ED with wife for weakness since yesterday. Pt slid out of recliner yesterday and twice today and could not get up today. Pt had rectal biopsy 3 days ago. Restricted on L arm. Hx kidney transplant, immunosuppressed. BP 88/48, bringing to room now. FINDINGS: BRAIN AND VENTRICLES: No acute hemorrhage. No evidence of acute infarct. No hydrocephalus. No extra-axial collection. No mass effect or midline shift. Periventricular and scattered subcortical white matter hypoattenuation is moderately advanced for age, stable from the prior study. ORBITS: No acute abnormality. SINUSES: Left ethmoid air cells are opacified. The paranasal sinuses and mastoid air cells are otherwise clear. SOFT TISSUES AND SKULL: No acute soft tissue abnormality. No skull fracture. IMPRESSION: 1. No acute intracranial abnormality. 2. Moderately advanced periventricular and scattered subcortical white matter hypoattenuation, stable from the prior study. Electronically signed by: Lonni Necessary MD 11/22/2023 01:00 PM EDT RP Workstation: [id number]   DG Chest Port 1 View if patient is  in a treatment room. Result Date: 11/22/2023 EXAM: 1 VIEW XRAY OF THE CHEST 11/22/2023 10:59:00 AM COMPARISON: 11/12/2023 CLINICAL HISTORY: Suspected Sepsis. Table formatting from the original note was not included.; Pt to ED with wife for weakness since yesterday. Pt slid out of recliner yesterday and twice today and could not get up today. Pt had rectal biopsy 3 days ago. Restricted on L arm. Hx kidney transplant, immunosuppressed. BP 88/48, bringing to room now. FINDINGS: LUNGS AND PLEURA: Stable pulmonary vascular congestion . No focal pulmonary opacity. No pneumothorax. Possible trace left pleural effusion. HEART AND MEDIASTINUM: Stable cardiomegaly. Calcified right hilar and paratracheal adenopathy. CABG markers. Sternotomy wires. BONES AND SOFT TISSUES: No acute osseous abnormality. IMPRESSION: 1. Stable cardiomegaly and pulmonary vascular congestion. Electronically signed by: Dayne Hassell MD 11/22/2023 11:04 AM EDT RP Workstation: [id number]        Scheduled Meds:  apixaban   5 mg Oral BID   buPROPion   150 mg Oral BID   colchicine   0.6 mg Oral QODAY  insulin  aspart  0-5 Units Subcutaneous QHS   insulin  aspart  0-6 Units Subcutaneous TID WC   insulin  glargine  14 Units Subcutaneous Daily   montelukast   10 mg Oral QHS   predniSONE   5 mg Oral Daily   rosuvastatin   10 mg Oral Daily   tacrolimus   2 mg Oral q morning   And   tacrolimus   1 mg Oral QPM   vancomycin  variable dose per unstable renal function (pharmacist dosing)   Does not apply See admin instructions   Continuous Infusions:  ceFEPime  (MAXIPIME ) IV     lactated ringers  150 mL/hr (11/22/23 2328)   metronidazole  500 mg (11/22/23 2334)     LOS: 1 day       Anthony CHRISTELLA Pouch, MD [facility] Pager [phone]  If 7PM-7AM, please contact night-coverage [email or web address] 11/23/2023, 9:07 AM

## 2023-11-23 NOTE — Progress Notes (Signed)
 [facility]  ROUNDING NOTE   Subjective:   Patient well-known to us  from prior admissions. He has past medical history of ESRD status post renal transplantation in 2012, chronic systolic heart failure ejection fraction 40 to 45%, atrial fibrillation, coronary disease, obesity, aortic stenosis, history of BPH, chronic venous stasis of both lower extremities, diverticulosis, erectile dysfunction, hyperlipidemia, depression, obstructive sleep apnea, pulmonary artery hypertension, status post gastric bypass, diabetes mellitus type 2 who presented status post fall and generalized weakness.  Patient status post recent hemorrhoid removal as well as rectal biopsy.  He also had anal wart ablation.  Objective:  Vital signs in last 24 hours:  Temp:  [97.4 F (36.3 C)-98.5 F (36.9 C)] 98.2 F (36.8 C) (08/31 0819) Pulse Rate:  [24-164] 70 (08/31 0819) Resp:  [12-20] 17 (08/31 0819) BP: (88-128)/(48-103) 120/73 (08/31 0819) SpO2:  [82 %-100 %] 100 % (08/31 0819) FiO2 (%):  [21 %-44 %] 44 % (08/31 0144) Weight:  [107.5 kg] 107.5 kg (08/30 1034)  Weight change:  Filed Weights   11/22/23 1034  Weight: 107.5 kg    Intake/Output: I/O last 3 completed shifts: In: 2040 [P.O.:660; I.V.:1280; IV Piggyback:100] Out: 1177 [Urine:1177]   Intake/Output this shift:  No intake/output data recorded.  Physical Exam: General: No acute distress  Head: Normocephalic, atraumatic. Moist oral mucosal membranes  Neck: Supple  Lungs:  Clear to auscultation, normal effort  Heart: S1S2 no rubs  Abdomen:  Soft, nontender, bowel sounds present  Extremities: Venous stasis changes noted  Neurologic: Awake, alert, following commands  Skin: No acute rash       Basic Metabolic Panel: Recent Labs  Lab 11/22/23 1140 11/23/23 0453  NA 137 140  K 4.1 3.8  CL 97* 100  CO2 27 28  GLUCOSE 157* 131*  BUN 91* 95*  CREATININE 3.87* 3.57*  CALCIUM  8.3* 8.4*    Liver Function Tests: Recent Labs   Lab 11/22/23 1140 11/23/23 0453  AST 18 14*  ALT 16 14  ALKPHOS 136* 126  BILITOT 0.4 0.8  PROT 6.8 7.0  ALBUMIN  3.5 3.3*   No results for input(s): LIPASE, AMYLASE in the last 168 hours. Recent Labs  Lab 11/22/23 1140  AMMONIA 21    CBC: Recent Labs  Lab 11/22/23 1052 11/23/23 0453  WBC 4.6 3.9*  NEUTROABS 3.7  --   HGB 9.9* 10.0*  HCT 33.3* 34.1*  MCV 97.7 96.3  PLT 93* 92*    Cardiac Enzymes: No results for input(s): CKTOTAL, CKMB, CKMBINDEX, TROPONINI in the last 168 hours.  BNP: Invalid input(s): POCBNP  CBG: Recent Labs  Lab 11/20/23 0550 11/20/23 0904 11/22/23 1640 11/22/23 2021 11/23/23 0822  GLUCAP 174* 169* 126* 166* 97    Microbiology: Results for orders placed or performed during the hospital encounter of 11/22/23  Culture, blood (Routine x 2)     Status: None (Preliminary result)   Collection Time: 11/22/23 10:52 AM   Specimen: BLOOD RIGHT ARM  Result Value Ref Range Status   Specimen Description BLOOD RIGHT ARM  Final   Special Requests   Final    BOTTLES DRAWN AEROBIC AND ANAEROBIC Blood Culture adequate volume   Culture   Final    NO GROWTH < 24 HOURS Performed at [facility], [address] [place], [place] [address]    Report Status PENDING  Incomplete  Culture, blood (Routine x 2)     Status: None (Preliminary result)   Collection Time: 11/22/23 10:53 AM   Specimen:  BLOOD RIGHT HAND  Result Value Ref Range Status   Specimen Description BLOOD RIGHT HAND  Final   Special Requests   Final    BOTTLES DRAWN AEROBIC AND ANAEROBIC Blood Culture adequate volume   Culture   Final    NO GROWTH < 24 HOURS Performed at [facility], [address], [place], [place] [address]    Report Status PENDING  Incomplete  Resp panel by RT-PCR (RSV, Flu A&B, Covid) Anterior Nasal Swab     Status: None   Collection Time: 11/22/23 10:53 AM   Specimen: Anterior Nasal Swab  Result Value Ref Range Status   SARS  Coronavirus 2 by RT PCR NEGATIVE NEGATIVE Final    Comment: (NOTE) SARS-CoV-2 target nucleic acids are NOT DETECTED.  The SARS-CoV-2 RNA is generally detectable in upper respiratory specimens during the acute phase of infection. The lowest concentration of SARS-CoV-2 viral copies this assay can detect is 138 copies/mL. A negative result does not preclude SARS-Cov-2 infection and should not be used as the sole basis for treatment or other patient management decisions. A negative result may occur with  improper specimen collection/handling, submission of specimen other than nasopharyngeal swab, presence of viral mutation(s) within the areas targeted by this assay, and inadequate number of viral copies(<138 copies/mL). A negative result must be combined with clinical observations, patient history, and epidemiological information. The expected result is Negative.  Fact Sheet for Patients:  BloggerCourse.com  Fact Sheet for Healthcare Providers:  SeriousBroker.it  This test is no t yet approved or cleared by the [place]  FDA and  has been authorized for detection and/or diagnosis of SARS-CoV-2 by FDA under an Emergency Use Authorization (EUA). This EUA will remain  in effect (meaning this test can be used) for the duration of the COVID-19 declaration under Section 564(b)(1) of the Act, 21 U.S.C.section 360bbb-3(b)(1), unless the authorization is terminated  or revoked sooner.       Influenza A by PCR NEGATIVE NEGATIVE Final   Influenza B by PCR NEGATIVE NEGATIVE Final    Comment: (NOTE) The Xpert Xpress SARS-CoV-2/FLU/RSV plus assay is intended as an aid in the diagnosis of influenza from Nasopharyngeal swab specimens and should not be used as a sole basis for treatment. Nasal washings and aspirates are unacceptable for Xpert Xpress SARS-CoV-2/FLU/RSV testing.  Fact Sheet for  Patients: BloggerCourse.com  Fact Sheet for Healthcare Providers: SeriousBroker.it  This test is not yet approved or cleared by the [place]  FDA and has been authorized for detection and/or diagnosis of SARS-CoV-2 by FDA under an Emergency Use Authorization (EUA). This EUA will remain in effect (meaning this test can be used) for the duration of the COVID-19 declaration under Section 564(b)(1) of the Act, 21 U.S.C. section 360bbb-3(b)(1), unless the authorization is terminated or revoked.     Resp Syncytial Virus by PCR NEGATIVE NEGATIVE Final    Comment: (NOTE) Fact Sheet for Patients: BloggerCourse.com  Fact Sheet for Healthcare Providers: SeriousBroker.it  This test is not yet approved or cleared by the [place]  FDA and has been authorized for detection and/or diagnosis of SARS-CoV-2 by FDA under an Emergency Use Authorization (EUA). This EUA will remain in effect (meaning this test can be used) for the duration of the COVID-19 declaration under Section 564(b)(1) of the Act, 21 U.S.C. section 360bbb-3(b)(1), unless the authorization is terminated or revoked.  Performed at [facility], [address], [place], [place] [address]    *Note: Due to a large number of results  and/or encounters for the requested time period, some results have not been displayed. A complete set of results can be found in Results Review.    Coagulation Studies: Recent Labs    11/22/23 1140 11/23/23 0453  LABPROT 20.4* 21.7*  INR 1.7* 1.8*    Urinalysis: Recent Labs    11/22/23 1311 11/23/23 0530  COLORURINE YELLOW* YELLOW*  LABSPEC 1.012 1.009  PHURINE 5.0 5.0  GLUCOSEU 50* 50*  HGBUR SMALL* LARGE*  BILIRUBINUR NEGATIVE NEGATIVE  KETONESUR NEGATIVE NEGATIVE  PROTEINUR NEGATIVE NEGATIVE  NITRITE NEGATIVE NEGATIVE  LEUKOCYTESUR NEGATIVE NEGATIVE       Imaging: CT CHEST ABDOMEN PELVIS WO CONTRAST Result Date: 11/22/2023 EXAM: CT CHEST, ABDOMEN AND PELVIS WITHOUT CONTRAST 11/22/2023 12:51:41 PM TECHNIQUE: CT of the chest, abdomen and pelvis was performed without the administration of intravenous contrast. Multiplanar reformatted images are provided for review. Automated exposure control, iterative reconstruction, and/or weight based adjustment of the mA/kV was utilized to reduce the radiation dose to as low as reasonably achievable. COMPARISON: 04/08/2023 CLINICAL HISTORY: Sepsis. Pt to ED with wife for weakness since yesterday. Pt slid out of recliner yesterday and twice today and could not get up today. Pt had rectal biopsy 3 days ago. Restricted on L arm. Hx kidney transplant, immunosuppressed. BP 88/48, bringing to room now. FINDINGS: CHEST: MEDIASTINUM AND LYMPH NODES: Cardiac enlargement, unchanged. Aortic atherosclerosis. Status post CABG. No pericardial effusion. Thyroid  gland, trachea and esophagus are normal. No enlarged mediastinal or axillary lymph nodes. Calcified mediastinal and hilar lymph nodes compatible with prior granulomatous disease. LUNGS AND PLEURA: New small left pleural effusion. Trace right pleural fluid. Bilateral calcified granulomas identified. No pneumothorax. No airspace consolidation. ABDOMEN AND PELVIS: LIVER: No suspicious liver lesion. Widening of the falciform ligament with relative hypertrophy of the lateral segment of the left lobe and caudate lobe of liver noted. This may be seen in this setting of early cirrhosis. GALLBLADDER AND BILE DUCTS: Status post cholecystectomy. No bile duct dilatation identified. SPLEEN: The spleen is enlarged measuring 15.4 cm craniocaudal. Previously 13.6 cm. PANCREAS: No acute abnormality. ADRENAL GLANDS: No acute abnormality. KIDNEYS, URETERS AND BLADDER: Bilateral end-stage atrophic kidneys containing multiple calcifications. Hyperdense right kidney lesion is incompletely characterized  without IV contrast measuring 1.5 cm. Previously 1.5 cm. This measures 95 Hounsfield units and likely represents a hemorrhagic or proteinaceous cyst. No follow-up imaging recommended. Right lower quadrant renal graft is identified. No hydronephrosis or mass associated with the graft. Bladder is unremarkable. GI AND BOWEL: Previous sleeve gastrectomy. No pathologic dilatation of the large or small bowel loops. The appendix is visualized and appears normal. No signs of bowel inflammation. REPRODUCTIVE ORGANS: No acute abnormality. PERITONEUM AND RETROPERITONEUM: No free fluid or fluid collections within the abdomen or pelvis. No signs of pneumoperitoneum. VASCULATURE: No acute abnormality. ABDOMINAL AND PELVIS LYMPH NODES: No lymphadenopathy. REPRODUCTIVE ORGANS: No acute abnormality. BONES AND SOFT TISSUES: Status post median sternotomy. Scattered remote rib fractures. Partially imaged subacute to chronic fracture deformity is noted involving the proximal left humerus which appears new from the previous exam. Increased subcutaneous soft tissue stranding within the flanks and posterior abdominal wall compatible with anasarca. IMPRESSION: 1. New small left pleural effusion and trace right pleural fluid. 2. Age-indeterminate fracture deformity involving the proximal left humerus appears new from 04/08/2023. 3. Morphologic features of the liver suggestive of cirrhosis. Enlarged spleen measuring 15.4 cm, previously 13.6 cm. 4. Right lower quadrant renal graft without hydronephrosis or mass. 5. Increased subcutaneous soft tissue stranding within the flanks and  posterior abdominal wall compatible with anasarca. Electronically signed by: Waddell Calk MD 11/22/2023 01:12 PM EDT RP Workstation: [id number]   CT Cervical Spine Wo Contrast Result Date: 11/22/2023 EXAM: CT CERVICAL SPINE WITHOUT CONTRAST 11/22/2023 12:51:41 PM TECHNIQUE: CT of the cervical spine was performed without the administration of intravenous contrast.  Multiplanar reformatted images are provided for review. Automated exposure control, iterative reconstruction, and/or weight based adjustment of the mA/kV was utilized to reduce the radiation dose to as low as reasonably achievable. COMPARISON: None available. CLINICAL HISTORY: Fall. Pt to ED with wife for weakness since yesterday. Pt slid out of recliner yesterday and twice today and could not get up today. Pt had rectal biopsy 3 days ago. Restricted on L arm. Hx kidney transplant, immunosuppressed. BP 88/48, bringing to room now. FINDINGS: CERVICAL SPINE: BONES AND ALIGNMENT: Straightening of the normal cervical lordosis is present. No acute fracture or traumatic malalignment. DEGENERATIVE CHANGES: Uncovertebral spurring results in moderate left foraminal stenosis at C5-6 and C6-7. Moderate central canal stenosis is present at C3-4 and C4-5. SOFT TISSUES: Calcified right paratracheal lymph nodes are present. VASCULATURE: Atherosclerotic calcifications are present at the carotid bifurcation bilaterally without definite stenosis. IMPRESSION: 1. No acute fracture or traumatic malalignment of the cervical spine. 2. Moderate left foraminal stenosis at C5-6 and C6-7 due to uncovertebral spurring. 3. Moderate central canal stenosis at C3-4 and C4-5. Electronically signed by: Lonni Necessary MD 11/22/2023 01:05 PM EDT RP Workstation: [id number]   CT Head Wo Contrast Result Date: 11/22/2023 EXAM: CT HEAD WITHOUT CONTRAST 11/22/2023 12:51:41 PM TECHNIQUE: CT of the head was performed without the administration of intravenous contrast. Automated exposure control, iterative reconstruction, and/or weight based adjustment of the mA/kV was utilized to reduce the radiation dose to as low as reasonably achievable. COMPARISON: CT head without contrast 09/26/2023. CLINICAL HISTORY: Fall. Pt to ED with wife for weakness since yesterday. Pt slid out of recliner yesterday and twice today and could not get up today. Pt had rectal  biopsy 3 days ago. Restricted on L arm. Hx kidney transplant, immunosuppressed. BP 88/48, bringing to room now. FINDINGS: BRAIN AND VENTRICLES: No acute hemorrhage. No evidence of acute infarct. No hydrocephalus. No extra-axial collection. No mass effect or midline shift. Periventricular and scattered subcortical white matter hypoattenuation is moderately advanced for age, stable from the prior study. ORBITS: No acute abnormality. SINUSES: Left ethmoid air cells are opacified. The paranasal sinuses and mastoid air cells are otherwise clear. SOFT TISSUES AND SKULL: No acute soft tissue abnormality. No skull fracture. IMPRESSION: 1. No acute intracranial abnormality. 2. Moderately advanced periventricular and scattered subcortical white matter hypoattenuation, stable from the prior study. Electronically signed by: Lonni Necessary MD 11/22/2023 01:00 PM EDT RP Workstation: [id number]   DG Chest Port 1 View if patient is in a treatment room. Result Date: 11/22/2023 EXAM: 1 VIEW XRAY OF THE CHEST 11/22/2023 10:59:00 AM COMPARISON: 11/12/2023 CLINICAL HISTORY: Suspected Sepsis. Table formatting from the original note was not included.; Pt to ED with wife for weakness since yesterday. Pt slid out of recliner yesterday and twice today and could not get up today. Pt had rectal biopsy 3 days ago. Restricted on L arm. Hx kidney transplant, immunosuppressed. BP 88/48, bringing to room now. FINDINGS: LUNGS AND PLEURA: Stable pulmonary vascular congestion . No focal pulmonary opacity. No pneumothorax. Possible trace left pleural effusion. HEART AND MEDIASTINUM: Stable cardiomegaly. Calcified right hilar and paratracheal adenopathy. CABG markers. Sternotomy wires. BONES AND SOFT TISSUES: No acute osseous abnormality. IMPRESSION: 1. Stable cardiomegaly  and pulmonary vascular congestion. Electronically signed by: Dayne Hassell MD 11/22/2023 11:04 AM EDT RP Workstation: [id number]     Medications:    ceFEPime  (MAXIPIME )  IV     lactated ringers  150 mL/hr (11/23/23 9081)   metronidazole  500 mg (11/22/23 2334)    apixaban   5 mg Oral BID   buPROPion   150 mg Oral BID   colchicine   0.6 mg Oral QODAY   insulin  aspart  0-5 Units Subcutaneous QHS   insulin  aspart  0-6 Units Subcutaneous TID WC   insulin  glargine  14 Units Subcutaneous Daily   montelukast   10 mg Oral QHS   predniSONE   5 mg Oral Daily   rosuvastatin   10 mg Oral Daily   tacrolimus   2 mg Oral q morning   And   tacrolimus   1 mg Oral QPM   vancomycin  variable dose per unstable renal function (pharmacist dosing)   Does not apply See admin instructions   acetaminophen  **OR** acetaminophen , albuterol , polyethylene glycol  Assessment/ Plan:  63 y.o. male with past medical history of ESRD status post renal transplantation in 2012, chronic systolic heart failure ejection fraction 40 to 45%, atrial fibrillation, coronary disease, obesity, aortic stenosis, history of BPH, chronic venous stasis of both lower extremities, diverticulosis, erectile dysfunction, hyperlipidemia, depression, obstructive sleep apnea, pulmonary artery hypertension, status post gastric bypass, diabetes mellitus type 2 who presented status post fall and generalized weakness.  Patient status post recent hemorrhoid removal as well as rectal biopsy.  He also had anal wart ablation.  1.  Acute kidney injury/chronic kidney disease stage IIIb with history of ESRD status post deceased donor kidney transplant 08/2010 with history of diabetic nephropathy on biopsy in 2017.  Patient had poor p.o. intake status post surgery.  Suspect acute kidney injury due to volume depletion.  Maintain the patient on tacrolimus  2 mg in a.m. and 1 mg in the p.m. as well as prednisone  5 mg daily.  We may need to consider touching base with his outpatient transplant nephrologist as he was previously on mycophenolate  260 mg in a.m. and 180 mg in the p.m.  Obtain transplant ultrasound to make sure no underlying  obstruction.  2.  Anemia of chronic kidney disease.  Hemoglobin currently 10.0.  No immediate need for Epogen.    LOS: 1 Jacqulynn Shappell 8/31/20259:44 AM

## 2023-11-23 NOTE — Evaluation (Signed)
 Physical Therapy Evaluation Patient Details Name: [name]. MRN: [id number] DOB: 1961-02-03 Today's Date: 11/23/2023  History of Present Illness  [name]. is a pleasant 63 y.o. male with medical history significant for history of ESRD s/p renal transplant in 2012, currently on immunosuppression therapy, history of heart failure last EF was 40 to 45% in July 2025, on torsemide , A-fib on Eliquis , CAD, obesity on CPAP who presented to ED at [facility] today after a fall at home and generalized weakness.   Clinical Impression  Pt admitted with above diagnosis. Pt currently with functional limitations due to the deficits listed below (see PT Problem List). Pt received upright in bed agreeable to PT services. Reports PTA being indep with gait. Receiving HHPT for L shoulder. Needing assist with bathing due to healing L humeral fx.   To date, pt has swollen L knee that pt reports as painful. Requires maxA+1 for STS with inability to stand pivot to recliner. Reliant on modA+1 to laterally scoot to bedside going to the L due to inability to use LUE to assist. Improved ability to Stand and stand pivot back to recliner CGA using RUE on arm rest. Able to titrate supplemental O2 to 2 L/min maintaining SPO2 > 96%. Pt left with all needs in reach. Deferred gait due to poor standing tolerance. Pt will benefit from skilled PT services to address acute weakness, pain, and functional mobility deficits to maximize return to PLOF.      If plan is discharge home, recommend the following: A lot of help with walking and/or transfers;A lot of help with bathing/dressing/bathroom;Assistance with cooking/housework;Assist for transportation;Help with stairs or ramp for entrance   Can travel by private vehicle   No    Equipment Recommendations Other (comment) (TBD by next venue of care)  Recommendations for Other Services       Functional Status Assessment Patient has had a recent decline in their  functional status and demonstrates the ability to make significant improvements in function in a reasonable and predictable amount of time.     Precautions / Restrictions Precautions Precautions: Fall Restrictions Weight Bearing Restrictions Per Provider Order: No      Mobility  Bed Mobility               General bed mobility comments: NT. In recliner pre and post session Patient Response: Cooperative  Transfers Overall transfer level: Needs assistance Equipment used: None Transfers: Sit to/from Stand, Bed to chair/wheelchair/BSC Sit to Stand: Max assist Stand pivot transfers: Contact guard assist        Lateral/Scoot Transfers: Mod assist General transfer comment: Unable to stand pivot from Recliner to bedside.Needs modA for lateral scoot. ABle to stand pivot from bedside to recliner due to ability to use RUE on arm rest. CGA for stand pivot.    Ambulation/Gait               General Gait Details: deferred due to difficulty with standing  Stairs            Wheelchair Mobility     Tilt Bed Tilt Bed Patient Response: Cooperative  Modified Rankin (Stroke Patients Only)       Balance Overall balance assessment: Needs assistance Sitting-balance support: No upper extremity supported, Feet supported Sitting balance-Leahy Scale: Good     Standing balance support: No upper extremity supported Standing balance-Leahy Scale: Poor Standing balance comment: limited due to R knee pain  Pertinent Vitals/Pain Pain Assessment Pain Assessment: Faces Faces Pain Scale: Hurts even more Pain Location: L knee Pain Descriptors / Indicators: Aching, Discomfort, Grimacing Pain Intervention(s): Limited activity within patient's tolerance, Monitored during session, Repositioned    Home Living Family/patient expects to be discharged to:: Private residence Living Arrangements: Spouse/significant other Available Help at  Discharge: Family;Available PRN/intermittently Type of Home: House Home Access: Stairs to enter Entrance Stairs-Rails: None Entrance Stairs-Number of Steps: 2   Home Layout: One level Home Equipment: Shower seat;Grab bars - tub/shower Additional Comments: PCA 1x/week to help with showering    Prior Function Prior Level of Function : Needs assist             Mobility Comments: Indep with gait. ADLs Comments: assist with bathing due to LUE from shoulder fracture.     Extremity/Trunk Assessment   Upper Extremity Assessment Upper Extremity Assessment: Defer to OT evaluation;LUE deficits/detail LUE Deficits / Details: limited LUE AROM    Lower Extremity Assessment Lower Extremity Assessment: Generalized weakness;LLE deficits/detail LLE Deficits / Details: LLE swollen around knee joint globally    Cervical / Trunk Assessment Cervical / Trunk Assessment: Normal  Communication   Communication Communication: No apparent difficulties    Cognition Arousal: Alert Behavior During Therapy: WFL for tasks assessed/performed   PT - Cognitive impairments: No apparent impairments                         Following commands: Intact       Cueing Cueing Techniques: Verbal cues     General Comments General comments (skin integrity, edema, etc.): R knee swollen and warm to touch    Exercises Other Exercises Other Exercises: Role of PT in acute setting, d/c recs   Assessment/Plan    PT Assessment Patient needs continued PT services  PT Problem List Decreased strength;Decreased range of motion;Decreased activity tolerance;Decreased balance;Decreased mobility;Pain       PT Treatment Interventions DME instruction;Balance training;Gait training;Neuromuscular re-education;Stair training;Functional mobility training;Patient/family education;Therapeutic activities;Therapeutic exercise    PT Goals (Current goals can be found in the Care Plan section)  Acute Rehab PT  Goals Patient Stated Goal: improve mobility PT Goal Formulation: With patient Time For Goal Achievement: 12/07/23 Potential to Achieve Goals: Good    Frequency Min 3X/week     Co-evaluation               AM-PAC PT 6 Clicks Mobility  Outcome Measure Help needed turning from your back to your side while in a flat bed without using bedrails?: A Lot Help needed moving from lying on your back to sitting on the side of a flat bed without using bedrails?: A Lot Help needed moving to and from a bed to a chair (including a wheelchair)?: A Lot Help needed standing up from a chair using your arms (e.g., wheelchair or bedside chair)?: A Lot Help needed to walk in hospital room?: Total Help needed climbing 3-5 steps with a railing? : Total 6 Click Score: 10    End of Session Equipment Utilized During Treatment: Gait belt Activity Tolerance: Patient limited by pain Patient left: in chair;with call bell/phone within reach;with chair alarm set Nurse Communication: Mobility status PT Visit Diagnosis: Muscle weakness (generalized) (M62.81);History of falling (Z91.81);Difficulty in walking, not elsewhere classified (R26.2);Pain Pain - Right/Left: Left Pain - part of body: Shoulder;Knee    Time: 9043-8979 PT Time Calculation (min) (ACUTE ONLY): 24 min   Charges:   PT Evaluation $PT Eval Moderate Complexity:  1 Mod PT Treatments $Therapeutic Activity: 8-22 mins PT General Charges $$ ACUTE PT VISIT: 1 Visit         Dorina HERO. Fairly IV, PT, DPT Physical Therapist- [place]  [facility] 11/23/2023, 12:36 PM

## 2023-11-23 NOTE — Evaluation (Signed)
 Occupational Therapy Evaluation Patient Details Name: [name]. MRN: [id number] DOB: Oct 03, 1960 Today's Date: 11/23/2023   History of Present Illness   Damari Dominyck Reser. is a pleasant 63 y.o. male with medical history significant for history of ESRD s/p renal transplant in 2012, currently on immunosuppression therapy, history of heart failure last EF was 40 to 45% in July 2025, on torsemide , A-fib on Eliquis , CAD, obesity on CPAP who presented to ED at [facility] today after a fall at home and generalized weakness.     Clinical Impressions Patient presenting with decreased Ind in self care, balance, functional mobility/transfers, endurance, and safety awareness. Patient living at home with wife and needing some assistance with self care tasks. Pt has aide that assists with shower 1x/wk.Upon entering the room, pt seated on EOB with nurse and NT present. Pt having just had large rectal bleeding. Pt needing max A of 2 to return to supine and max A to roll  L <> R in bed for repositoning. Pt reporting significant stomach pain. OT taking vitals which were WNLs. Session terminated at this time for medical situation to be addressed further. Patient will benefit from acute OT to increase overall independence in the areas of ADLs, functional mobility, and safety awareness in order to safely discharge.     If plan is discharge home, recommend the following:   A lot of help with walking and/or transfers;A lot of help with bathing/dressing/bathroom;Assistance with cooking/housework;Assist for transportation;Help with stairs or ramp for entrance     Functional Status Assessment   Patient has had a recent decline in their functional status and demonstrates the ability to make significant improvements in function in a reasonable and predictable amount of time.     Equipment Recommendations   Other (comment) (defer to next venue of care)      Precautions/Restrictions    Precautions Precautions: Fall     Mobility Bed Mobility Overal bed mobility: Needs Assistance Bed Mobility: Sit to Supine       Sit to supine: Max assist, +2 for physical assistance        Transfers                          Balance Overall balance assessment: Needs assistance Sitting-balance support: No upper extremity supported, Feet supported Sitting balance-Leahy Scale: Fair                                     ADL either performed or assessed with clinical judgement   ADL Overall ADL's : Needs assistance/impaired                                       General ADL Comments: session limited secondary to rectal bleeding but pt will need significant assistance     Vision Baseline Vision/History: 1 Wears glasses Patient Visual Report: No change from baseline              Pertinent Vitals/Pain Pain Assessment Pain Assessment: 0-10 Pain Score: 10-Worst pain ever Pain Location: stomach Pain Descriptors / Indicators: Aching, Discomfort, Grimacing, Moaning Pain Intervention(s): Limited activity within patient's tolerance, Monitored during session, Repositioned     Extremity/Trunk Assessment Upper Extremity Assessment Upper Extremity Assessment: Generalized weakness;Overall [facility] for tasks assessed LUE Deficits / Details: limited LUE  AROM from humerus fx in June   Lower Extremity Assessment Lower Extremity Assessment: Generalized weakness;LLE deficits/detail LLE Deficits / Details: LLE swollen around knee joint globally   Cervical / Trunk Assessment Cervical / Trunk Assessment: Normal   Communication Communication Communication: No apparent difficulties   Cognition Arousal: Alert Behavior During Therapy: WFL for tasks assessed/performed Cognition: No apparent impairments                               Following commands: Intact       Cueing  General Comments   Cueing Techniques: Verbal cues  R  knee swollen and warm to touch           Home Living Family/patient expects to be discharged to:: Private residence Living Arrangements: Spouse/significant other Available Help at Discharge: Family;Available PRN/intermittently Type of Home: House Home Access: Stairs to enter [employer] of Steps: 2 Entrance Stairs-Rails: None Home Layout: One level     Bathroom Shower/Tub: Producer, television/film/video: Standard Bathroom Accessibility: Yes   Home Equipment: Shower seat;Grab bars - tub/shower   Additional Comments: PCA 1x/week to help with showering      Prior Functioning/Environment Prior Level of Function : Needs assist             Mobility Comments: Indep with gait. ADLs Comments: assist with bathing due to LUE from shoulder fracture. Pt reports on the other days he get's it done even if it takes hours    OT Problem List: Decreased strength;Decreased safety awareness;Decreased activity tolerance;Impaired balance (sitting and/or standing);Pain   OT Treatment/Interventions: Self-care/ADL training;Therapeutic activities;Therapeutic exercise;Energy conservation;Patient/family education;Balance training      OT Goals(Current goals can be found in the care plan section)   Acute Rehab OT Goals Patient Stated Goal: to feel better and decrease pain OT Goal Formulation: With patient Time For Goal Achievement: 12/07/23 Potential to Achieve Goals: Fair ADL Goals Pt Will Perform Grooming: with supervision;sitting Pt Will Perform Upper Body Dressing: with supervision;sitting Pt Will Perform Lower Body Dressing: with contact guard assist;sit to/from stand Pt Will Transfer to Toilet: with contact guard assist;ambulating Pt Will Perform Toileting - Clothing Manipulation and hygiene: with contact guard assist;sit to/from stand   OT Frequency:  Min 2X/week       AM-PAC OT 6 Clicks Daily Activity     Outcome Measure Help from another person eating  meals?: None Help from another person taking care of personal grooming?: A Little Help from another person toileting, which includes using toliet, bedpan, or urinal?: A Lot Help from another person bathing (including washing, rinsing, drying)?: A Lot Help from another person to put on and taking off regular upper body clothing?: A Lot Help from another person to put on and taking off regular lower body clothing?: A Lot 6 Click Score: 15   End of Session Nurse Communication: Mobility status  Activity Tolerance: Patient limited by pain Patient left: in bed;with call bell/phone within reach;with bed alarm set;with family/visitor present  OT Visit Diagnosis: Unsteadiness on feet (R26.81);Repeated falls (R29.6);Muscle weakness (generalized) (M62.81);Pain Pain - part of body:  (stomach)                Time: 8592-8572 OT Time Calculation (min): 20 min Charges:  OT General Charges $OT Visit: 1 Visit OT Evaluation $OT Eval Moderate Complexity: 1 Mod OT Treatments $Therapeutic Activity: 8-22 mins  Izetta Claude, MS, OTR/L , CBIS ascom [phone]  11/23/23, 3:31  PM

## 2023-11-23 NOTE — Significant Event (Signed)
       CROSS COVER NOTE  NAME: [name]. MRN: [id number] DOB : March 09, 1961 ATTENDING PHYSICIAN: Roann Gouty, MD    Date of Service   11/23/2023   HPI/Events of Note   Message received from [facility] at 0742 pm Situation-Patient's wife states he hasn't had much urine output today. States around less than 200cc's.  Background-[name]. is a pleasant 63 y.o. male with medical history significant for history of ESRD s/p renal transplant in 2012, currently on immunosuppression therapy, history of heart failure last EF was 40 to 45% in July 2025, on torsemide , A-fib on Eliquis , CAD, obesity on CPAP who presented to ED at [facility] today after a fall at home and generalized weakness. Per patient's wife patient slid down off his recliner this morning. He has been weak and has been sleeping out of his recliner multiple times last couple days. Patient is found to be hypotensive around 88/48, tachypneic at 24, generally weak, hemoglobin 9.9 and hematocrit 33.3 platelets 93, afebrile at 98.4, creatinine 3.87 and BUN 91, baseline creatinine was around 2. His troponin was 85. Patient was initiated for sepsis protocol. COVID and flu was negative, blood cultures were sent. Patient was started on vancomycin , cefepime  and metronidazole  for unknown sepsis. Hospitalist service was consulted for evaluation for admission for sepsis and AKI. Assessment-Patient states he feels fullness. Asked to void and only voided 50cc.  Recommendation-Order for bladder scan   Patient was able to void over half the volume obtained on bladder scan - nurse reported evidence of hematuria Currently with inability to void and large volume on bladder scan  Has hx of UTIs in past with E  faecaleis  Interventions   Assessment/Plan: In and out cath Send specimen for repeat ua. micro  X       Erminio LITTIE Cone NP [facility] Cross Cover 7pm-7am - check amion for availability Pager [phone]

## 2023-11-23 NOTE — Plan of Care (Signed)
   Problem: Nutritional: Goal: Maintenance of adequate nutrition will improve Outcome: Progressing

## 2023-11-24 ENCOUNTER — Inpatient Hospital Stay

## 2023-11-24 DIAGNOSIS — N179 Acute kidney failure, unspecified: Secondary | ICD-10-CM | POA: Diagnosis not present

## 2023-11-24 DIAGNOSIS — N189 Chronic kidney disease, unspecified: Secondary | ICD-10-CM | POA: Diagnosis not present

## 2023-11-24 LAB — CBC
HCT: 32.5 % — ABNORMAL LOW (ref 39.0–52.0)
Hemoglobin: 10 g/dL — ABNORMAL LOW (ref 13.0–17.0)
MCH: 28.7 pg (ref 26.0–34.0)
MCHC: 30.8 g/dL (ref 30.0–36.0)
MCV: 93.1 fL (ref 80.0–100.0)
Platelets: 100 K/uL — ABNORMAL LOW (ref 150–400)
RBC: 3.49 MIL/uL — ABNORMAL LOW (ref 4.22–5.81)
RDW: 15 % (ref 11.5–15.5)
WBC: 3.3 K/uL — ABNORMAL LOW (ref 4.0–10.5)
nRBC: 0 % (ref 0.0–0.2)

## 2023-11-24 LAB — GLUCOSE, CAPILLARY
Glucose-Capillary: 99 mg/dL (ref 70–99)
Glucose-Capillary: 124 mg/dL — ABNORMAL HIGH (ref 70–99)
Glucose-Capillary: 195 mg/dL — ABNORMAL HIGH (ref 70–99)
Glucose-Capillary: 136 mg/dL — ABNORMAL HIGH (ref 70–99)

## 2023-11-24 LAB — BASIC METABOLIC PANEL WITH GFR
Anion gap: 10 (ref 5–15)
BUN: 90 mg/dL — ABNORMAL HIGH (ref 8–23)
CO2: 28 mmol/L (ref 22–32)
Calcium: 8.4 mg/dL — ABNORMAL LOW (ref 8.9–10.3)
Chloride: 102 mmol/L (ref 98–111)
Creatinine, Ser: 2.87 mg/dL — ABNORMAL HIGH (ref 0.61–1.24)
GFR, Estimated: 24 mL/min — ABNORMAL LOW (ref >60)
Glucose, Bld: 119 mg/dL — ABNORMAL HIGH (ref 70–99)
Potassium: 3.3 mmol/L — ABNORMAL LOW (ref 3.5–5.1)
Sodium: 140 mmol/L (ref 135–145)

## 2023-11-24 LAB — MRSA NEXT GEN BY PCR, NASAL: MRSA by PCR Next Gen: NOT DETECTED

## 2023-11-24 LAB — VANCOMYCIN, RANDOM: Vancomycin Rm: 12 ug/mL

## 2023-11-24 MED ORDER — VANCOMYCIN HCL 1500 MG/300ML IV SOLN
1500.0000 mg | INTRAVENOUS | Status: DC
  Administered 2023-11-24: 1500 mg via INTRAVENOUS
  Filled 2023-11-24: qty 300

## 2023-11-24 MED ORDER — SODIUM CHLORIDE 0.9 % IV SOLN
2.0000 g | Freq: Two times a day (BID) | INTRAVENOUS | Status: DC
  Administered 2023-11-24 (×2): 2 g via INTRAVENOUS
  Filled 2023-11-24 (×3): qty 12.5

## 2023-11-24 NOTE — Progress Notes (Signed)
 Progress Note   Patient: [name]. FMW:[id number] DOB: Feb 13, 1961 DOA: 11/22/2023     2 DOS: the patient was seen and examined on 11/24/2023   Brief hospital course: HPI was taken from Dr. Roann: [name]. is a pleasant 63 y.o. male with medical history significant for history of ESRD s/p renal transplant in 2012, currently on immunosuppression therapy, history of heart failure last EF was 40 to 45% in July 2025, on torsemide , A-fib on Eliquis , CAD, obesity on CPAP who presented to ED at [facility] today after a fall at home and generalized weakness.  Per patient's wife patient slid down off his recliner this morning.  He has been weak and has been sleeping out of his recliner multiple times last couple days.  There was no history of fever, nausea, vomiting, chest pain, palpitations, shortness of breath.  He denies any diarrhea, dysuria or urinary symptoms.  Wife noted that he had a biopsy of his rectum 3 days ago and also had a hemorrhoid removal then he has been having some bloody stools.  Patient also said that he had blood in his stool.  He was not able to eat and drink well for the last couple days after he had the surgery.  Patient does not wear oxygen  at baseline.   ED Course: Upon arrival to the ED, patient is found to be hypotensive around 88/48, tachypneic at 24, generally weak, hemoglobin 9.9 and hematocrit 33.3 platelets 93, afebrile at 98.4, creatinine 3.87 and BUN 91, baseline creatinine was around 2.  His troponin was 85.  Patient was initiated for sepsis protocol.  COVID and flu was negative, blood cultures were sent.  Patient was started on vancomycin , cefepime  and metronidazole  for unknown sepsis.  Hospitalist service was consulted for evaluation for admission for sepsis and AKI.    Assessment and Plan:  AKI on CKDIIIb: w/ hx of renal transplant & on immunosuppresants.  Continue on IVFs.  Holding home bactrim .  Continue on home dose of steroids, tacrolimus ,  mycophenolate .  Continue on IV cefepime , flagyl  & vanco.  Renal US  ordered.  Concern for urinary retention.  Bladder scan prn.  May need a foley.    Sepsis: met criteria w/ tachycardia, tachypnea but unknown source of infection. CXR was neg for pneumonia. UA was positive for Hbg & glucose.  Continue on IV abxs and IVFs.   Left knee pain: fall at home.  XR shows small knee joint effusion & mild medial compartment joint space narrowing.  Patient has increasing persistent and given the nature of trauma we will rule out any ligamentous tear with MRI of the knee Continue as needed pain medication   Rectal bleeding: w/ recent hx of biopsy of rectum & hemorrhoid removed. Likely etiology of rectal bleeding. Repeat H&H ordered. If continues to bleed/drop in H&H, consider GI consult    Generalized weakness: PT recs SNF. OT consulted    Likely PAF: continue on home dose of eliquis    Chronic systolic CHF: monitor I/Os. Holding home diuretics   OSA: on CPAP   DM2: likely poorly controlled. Continue on glargine, SSI w/ accuchecks    Hx of gout: continue on colchicine     Elevated troponin: likely secondary to demand ischemia    Obesity: BMI 34.0. Would benefit from weight loss     DVT prophylaxis: eliquis  Code Status: full  Family Communication:  Disposition Plan: possibly d/c to SNF.   Level of care: Telemetry Cardiac  Consultants:  Nephro  Subjective:  Patient seen and examined at bedside this morning Complaining of pain as well as limited range of motion involving the left knee after a recent trauma Denies chest pain nausea vomiting or worsening respiratory function  Physical Exam:  General exam: Appears calm and comfortable  Respiratory system: Clear to auscultation. Respiratory effort normal. Cardiovascular system: S1 & S2 +. No rubs, gallops or clicks Gastrointestinal system: Abdomen is obese, soft and nontender. Normal bowel sounds heard. Central nervous system:  Alert and oriented.moves all extremities Psychiatry: Judgement and insight appear normal. Flat mood and affect  Data Reviewed:  X-ray of the left knee reviewed that shows small effusion.  No dislocation or fractures     Latest Ref Rng & Units 11/24/2023    4:05 AM 11/23/2023    4:17 PM 11/23/2023    4:53 AM  CBC  WBC 4.0 - 10.5 K/uL 3.3   3.9   Hemoglobin 13.0 - 17.0 g/dL 89.9  9.8  89.9   Hematocrit 39.0 - 52.0 % 32.5  32.7  34.1   Platelets 150 - 400 K/uL 100   92        Latest Ref Rng & Units 11/24/2023    4:05 AM 11/23/2023    4:53 AM 11/22/2023   11:40 AM  BMP  Glucose 70 - 99 mg/dL 880  868  842   BUN 8 - 23 mg/dL 90  95  91   Creatinine 0.61 - 1.24 mg/dL 7.12  6.42  6.12   Sodium 135 - 145 mmol/L 140  140  137   Potassium 3.5 - 5.1 mmol/L 3.3  3.8  4.1   Chloride 98 - 111 mmol/L 102  100  97   CO2 22 - 32 mmol/L 28  28  27    Calcium  8.9 - 10.3 mg/dL 8.4  8.4  8.3     Vitals:   11/23/23 2318 11/24/23 0436 11/24/23 0850 11/24/23 1125  BP: 129/62 123/70 139/72 133/72  Pulse:   75 77  Resp: 20 20 18 16   Temp: 98 F (36.7 C) 98.4 F (36.9 C) 98.5 F (36.9 C) 98.3 F (36.8 C)  TempSrc: Oral Oral    SpO2: 91% 94% 93% 95%  Weight:      Height:        Author: Drue ONEIDA Potter, MD 11/24/2023 3:16 PM  For on call review [email or web address].

## 2023-11-24 NOTE — Progress Notes (Signed)
 [facility]  ROUNDING NOTE   Subjective:   Patient well-known to us  from prior admissions. He has past medical history of ESRD status post renal transplantation in 2012, chronic systolic heart failure ejection fraction 40 to 45%, atrial fibrillation, coronary disease, obesity, aortic stenosis, history of BPH, chronic venous stasis of both lower extremities, diverticulosis, erectile dysfunction, hyperlipidemia, depression, obstructive sleep apnea, pulmonary artery hypertension, status post gastric bypass, diabetes mellitus type 2 who presented status post fall and generalized weakness.  Patient status post recent hemorrhoid removal as well as rectal biopsy.  He also had anal wart ablation.  Update:    Patient tearful today. Renal function has improved. Creatinine down to 2.8.  Objective:  Vital signs in last 24 hours:  Temp:  [98 F (36.7 C)-98.6 F (37 C)] 98.5 F (36.9 C) (09/01 0850) Pulse Rate:  [51-95] 75 (09/01 0850) Resp:  [16-20] 18 (09/01 0850) BP: (111-141)/(62-80) 139/72 (09/01 0850) SpO2:  [91 %-99 %] 93 % (09/01 0850)  Weight change:  Filed Weights   11/22/23 1034  Weight: 107.5 kg    Intake/Output: I/O last 3 completed shifts: In: 4212.5 [P.O.:1380; I.V.:2432.5; IV Piggyback:400] Out: 3227 [Urine:3227]   Intake/Output this shift:  Total I/O In: 240 [P.O.:240] Out: -   Physical Exam: General: No acute distress  Head: Normocephalic, atraumatic. Moist oral mucosal membranes  Neck: Supple  Lungs:  Clear to auscultation, normal effort  Heart: S1S2 no rubs  Abdomen:  Soft, nontender, bowel sounds present  Extremities: Venous stasis changes noted  Neurologic: Awake, alert, following commands  Skin: No acute rash       Basic Metabolic Panel: Recent Labs  Lab 11/22/23 1140 11/23/23 0453 11/24/23 0405  NA 137 140 140  K 4.1 3.8 3.3*  CL 97* 100 102  CO2 27 28 28   GLUCOSE 157* 131* 119*  BUN 91* 95* 90*  CREATININE 3.87* 3.57* 2.87*   CALCIUM  8.3* 8.4* 8.4*    Liver Function Tests: Recent Labs  Lab 11/22/23 1140 11/23/23 0453  AST 18 14*  ALT 16 14  ALKPHOS 136* 126  BILITOT 0.4 0.8  PROT 6.8 7.0  ALBUMIN  3.5 3.3*   No results for input(s): LIPASE, AMYLASE in the last 168 hours. Recent Labs  Lab 11/22/23 1140  AMMONIA 21    CBC: Recent Labs  Lab 11/22/23 1052 11/23/23 0453 11/23/23 1617 11/24/23 0405  WBC 4.6 3.9*  --  3.3*  NEUTROABS 3.7  --   --   --   HGB 9.9* 10.0* 9.8* 10.0*  HCT 33.3* 34.1* 32.7* 32.5*  MCV 97.7 96.3  --  93.1  PLT 93* 92*  --  100*    Cardiac Enzymes: No results for input(s): CKTOTAL, CKMB, CKMBINDEX, TROPONINI in the last 168 hours.  BNP: Invalid input(s): POCBNP  CBG: Recent Labs  Lab 11/23/23 0822 11/23/23 1140 11/23/23 1830 11/23/23 2059 11/24/23 0847  GLUCAP 97 113* 170* 167* 99    Microbiology: Results for orders placed or performed during the hospital encounter of 11/22/23  Culture, blood (Routine x 2)     Status: None (Preliminary result)   Collection Time: 11/22/23 10:52 AM   Specimen: BLOOD RIGHT ARM  Result Value Ref Range Status   Specimen Description BLOOD RIGHT ARM  Final   Special Requests   Final    BOTTLES DRAWN AEROBIC AND ANAEROBIC Blood Culture adequate volume   Culture   Final    NO GROWTH 2 DAYS Performed at [facility], [address] [place], [place] [address]    Report Status PENDING  Incomplete  Culture, blood (Routine x 2)     Status: None (Preliminary result)   Collection Time: 11/22/23 10:53 AM   Specimen: BLOOD RIGHT HAND  Result Value Ref Range Status   Specimen Description BLOOD RIGHT HAND  Final   Special Requests   Final    BOTTLES DRAWN AEROBIC AND ANAEROBIC Blood Culture adequate volume   Culture   Final    NO GROWTH 2 DAYS Performed at [facility], [address] [place], [place] [address]    Report Status PENDING  Incomplete  Resp panel by RT-PCR (RSV, Flu A&B, Covid)  Anterior Nasal Swab     Status: None   Collection Time: 11/22/23 10:53 AM   Specimen: Anterior Nasal Swab  Result Value Ref Range Status   SARS Coronavirus 2 by RT PCR NEGATIVE NEGATIVE Final    Comment: (NOTE) SARS-CoV-2 target nucleic acids are NOT DETECTED.  The SARS-CoV-2 RNA is generally detectable in upper respiratory specimens during the acute phase of infection. The lowest concentration of SARS-CoV-2 viral copies this assay can detect is 138 copies/mL. A negative result does not preclude SARS-Cov-2 infection and should not be used as the sole basis for treatment or other patient management decisions. A negative result may occur with  improper specimen collection/handling, submission of specimen other than nasopharyngeal swab, presence of viral mutation(s) within the areas targeted by this assay, and inadequate number of viral copies(<138 copies/mL). A negative result must be combined with clinical observations, patient history, and epidemiological information. The expected result is Negative.  Fact Sheet for Patients:  BloggerCourse.com  Fact Sheet for Healthcare Providers:  SeriousBroker.it  This test is no t yet approved or cleared by the [place]  FDA and  has been authorized for detection and/or diagnosis of SARS-CoV-2 by FDA under an Emergency Use Authorization (EUA). This EUA will remain  in effect (meaning this test can be used) for the duration of the COVID-19 declaration under Section 564(b)(1) of the Act, 21 U.S.C.section 360bbb-3(b)(1), unless the authorization is terminated  or revoked sooner.       Influenza A by PCR NEGATIVE NEGATIVE Final   Influenza B by PCR NEGATIVE NEGATIVE Final    Comment: (NOTE) The Xpert Xpress SARS-CoV-2/FLU/RSV plus assay is intended as an aid in the diagnosis of influenza from Nasopharyngeal swab specimens and should not be used as a sole basis for treatment. Nasal washings  and aspirates are unacceptable for Xpert Xpress SARS-CoV-2/FLU/RSV testing.  Fact Sheet for Patients: BloggerCourse.com  Fact Sheet for Healthcare Providers: SeriousBroker.it  This test is not yet approved or cleared by the [place]  FDA and has been authorized for detection and/or diagnosis of SARS-CoV-2 by FDA under an Emergency Use Authorization (EUA). This EUA will remain in effect (meaning this test can be used) for the duration of the COVID-19 declaration under Section 564(b)(1) of the Act, 21 U.S.C. section 360bbb-3(b)(1), unless the authorization is terminated or revoked.     Resp Syncytial Virus by PCR NEGATIVE NEGATIVE Final    Comment: (NOTE) Fact Sheet for Patients: BloggerCourse.com  Fact Sheet for Healthcare Providers: SeriousBroker.it  This test is not yet approved or cleared by the [place]  FDA and has been authorized for detection and/or diagnosis of SARS-CoV-2 by FDA under an Emergency Use Authorization (EUA). This EUA will remain in effect (meaning this test can be used) for the duration of the COVID-19 declaration under Section 564(b)(1) of the  Act, 21 U.S.C. section 360bbb-3(b)(1), unless the authorization is terminated or revoked.  Performed at [facility], [address] [place], [place] [address]   MRSA Next Gen by PCR, Nasal     Status: None   Collection Time: 11/24/23  4:03 AM   Specimen: Nasal Mucosa; Nasal Swab  Result Value Ref Range Status   MRSA by PCR Next Gen NOT DETECTED NOT DETECTED Final    Comment: (NOTE) The GeneXpert MRSA Assay (FDA approved for NASAL specimens only), is one component of a comprehensive MRSA colonization surveillance program. It is not intended to diagnose MRSA infection nor to guide or monitor treatment for MRSA infections. Test performance is not FDA approved in patients less than 67  years old. Performed at [facility], [name] [place], [place] [address]    *Note: Due to a large number of results and/or encounters for the requested time period, some results have not been displayed. A complete set of results can be found in Results Review.    Coagulation Studies: Recent Labs    11/22/23 1140 11/23/23 0453  LABPROT 20.4* 21.7*  INR 1.7* 1.8*    Urinalysis: Recent Labs    11/22/23 1311 11/23/23 0530  COLORURINE YELLOW* YELLOW*  LABSPEC 1.012 1.009  PHURINE 5.0 5.0  GLUCOSEU 50* 50*  HGBUR SMALL* LARGE*  BILIRUBINUR NEGATIVE NEGATIVE  KETONESUR NEGATIVE NEGATIVE  PROTEINUR NEGATIVE NEGATIVE  NITRITE NEGATIVE NEGATIVE  LEUKOCYTESUR NEGATIVE NEGATIVE      Imaging: US  Renal Transplant w/Doppler Result Date: 11/23/2023 EXAM: RETROPERITONEAL ULTRASOUND OF THE KIDNEYS 11/23/2023 03:31:55 PM TECHNIQUE: Real-time ultrasonography of the retroperitoneum, specifically the kidneys and urinary bladder, was performed. COMPARISON: Renal transplant ultrasound 06/21/2022. CLINICAL HISTORY: 409830 AKI (acute kidney injury) (HCC) 409830. AKI (acute kidney injury) (HCC) FINDINGS: TRANSPLANT KIDNEY: The right lower quadrant renal transplant measures 13.3 x 7.0 x 8.0 cm. Estimated volume is 389 ml. Mild hydronephrosis is present. Venous waveform is present in the main renal artery. Resistive index in the main renal artery is 0.88. Resistive index in the upper pole segmental artery is 0.81. Resistive index in the lower pole segmental artery is 0.71. BLADDER: The urinary bladder is distended. IMPRESSION: 1. Right lower quadrant renal transplant with mild hydronephrosis and borderline elevated resistive index in the main renal artery (0.88). Electronically signed by: Lonni Necessary MD 11/23/2023 06:09 PM EDT RP Workstation: [id number]   DG Knee 1-2 Views Left Result Date: 11/23/2023 EXAM: 1 or 2 VIEW(S) XRAY OF THE LEFT KNEE 11/23/2023 10:53:00 AM COMPARISON:  None available. CLINICAL HISTORY: Knee pain, left FINDINGS: BONES AND JOINTS: No acute fracture. No focal osseous lesion. No joint dislocation. Small knee joint effusion. Mild medial compartment joint space narrowing. SOFT TISSUES: Dense vascular calcifications. IMPRESSION: 1. Small knee joint effusion. 2. Mild medial compartment joint space narrowing. Electronically signed by: Lonni Necessary MD 11/23/2023 11:15 AM EDT RP Workstation: [id number]   CT CHEST ABDOMEN PELVIS WO CONTRAST Result Date: 11/22/2023 EXAM: CT CHEST, ABDOMEN AND PELVIS WITHOUT CONTRAST 11/22/2023 12:51:41 PM TECHNIQUE: CT of the chest, abdomen and pelvis was performed without the administration of intravenous contrast. Multiplanar reformatted images are provided for review. Automated exposure control, iterative reconstruction, and/or weight based adjustment of the mA/kV was utilized to reduce the radiation dose to as low as reasonably achievable. COMPARISON: 04/08/2023 CLINICAL HISTORY: Sepsis. Pt to ED with wife for weakness since yesterday. Pt slid out of recliner yesterday and twice today and could not get up today. Pt had rectal biopsy 3 days  ago. Restricted on L arm. Hx kidney transplant, immunosuppressed. BP 88/48, bringing to room now. FINDINGS: CHEST: MEDIASTINUM AND LYMPH NODES: Cardiac enlargement, unchanged. Aortic atherosclerosis. Status post CABG. No pericardial effusion. Thyroid  gland, trachea and esophagus are normal. No enlarged mediastinal or axillary lymph nodes. Calcified mediastinal and hilar lymph nodes compatible with prior granulomatous disease. LUNGS AND PLEURA: New small left pleural effusion. Trace right pleural fluid. Bilateral calcified granulomas identified. No pneumothorax. No airspace consolidation. ABDOMEN AND PELVIS: LIVER: No suspicious liver lesion. Widening of the falciform ligament with relative hypertrophy of the lateral segment of the left lobe and caudate lobe of liver noted. This may be seen in  this setting of early cirrhosis. GALLBLADDER AND BILE DUCTS: Status post cholecystectomy. No bile duct dilatation identified. SPLEEN: The spleen is enlarged measuring 15.4 cm craniocaudal. Previously 13.6 cm. PANCREAS: No acute abnormality. ADRENAL GLANDS: No acute abnormality. KIDNEYS, URETERS AND BLADDER: Bilateral end-stage atrophic kidneys containing multiple calcifications. Hyperdense right kidney lesion is incompletely characterized without IV contrast measuring 1.5 cm. Previously 1.5 cm. This measures 95 Hounsfield units and likely represents a hemorrhagic or proteinaceous cyst. No follow-up imaging recommended. Right lower quadrant renal graft is identified. No hydronephrosis or mass associated with the graft. Bladder is unremarkable. GI AND BOWEL: Previous sleeve gastrectomy. No pathologic dilatation of the large or small bowel loops. The appendix is visualized and appears normal. No signs of bowel inflammation. REPRODUCTIVE ORGANS: No acute abnormality. PERITONEUM AND RETROPERITONEUM: No free fluid or fluid collections within the abdomen or pelvis. No signs of pneumoperitoneum. VASCULATURE: No acute abnormality. ABDOMINAL AND PELVIS LYMPH NODES: No lymphadenopathy. REPRODUCTIVE ORGANS: No acute abnormality. BONES AND SOFT TISSUES: Status post median sternotomy. Scattered remote rib fractures. Partially imaged subacute to chronic fracture deformity is noted involving the proximal left humerus which appears new from the previous exam. Increased subcutaneous soft tissue stranding within the flanks and posterior abdominal wall compatible with anasarca. IMPRESSION: 1. New small left pleural effusion and trace right pleural fluid. 2. Age-indeterminate fracture deformity involving the proximal left humerus appears new from 04/08/2023. 3. Morphologic features of the liver suggestive of cirrhosis. Enlarged spleen measuring 15.4 cm, previously 13.6 cm. 4. Right lower quadrant renal graft without hydronephrosis or  mass. 5. Increased subcutaneous soft tissue stranding within the flanks and posterior abdominal wall compatible with anasarca. Electronically signed by: Waddell Calk MD 11/22/2023 01:12 PM EDT RP Workstation: [id number]   CT Cervical Spine Wo Contrast Result Date: 11/22/2023 EXAM: CT CERVICAL SPINE WITHOUT CONTRAST 11/22/2023 12:51:41 PM TECHNIQUE: CT of the cervical spine was performed without the administration of intravenous contrast. Multiplanar reformatted images are provided for review. Automated exposure control, iterative reconstruction, and/or weight based adjustment of the mA/kV was utilized to reduce the radiation dose to as low as reasonably achievable. COMPARISON: None available. CLINICAL HISTORY: Fall. Pt to ED with wife for weakness since yesterday. Pt slid out of recliner yesterday and twice today and could not get up today. Pt had rectal biopsy 3 days ago. Restricted on L arm. Hx kidney transplant, immunosuppressed. BP 88/48, bringing to room now. FINDINGS: CERVICAL SPINE: BONES AND ALIGNMENT: Straightening of the normal cervical lordosis is present. No acute fracture or traumatic malalignment. DEGENERATIVE CHANGES: Uncovertebral spurring results in moderate left foraminal stenosis at C5-6 and C6-7. Moderate central canal stenosis is present at C3-4 and C4-5. SOFT TISSUES: Calcified right paratracheal lymph nodes are present. VASCULATURE: Atherosclerotic calcifications are present at the carotid bifurcation bilaterally without definite stenosis. IMPRESSION: 1. No acute fracture or traumatic  malalignment of the cervical spine. 2. Moderate left foraminal stenosis at C5-6 and C6-7 due to uncovertebral spurring. 3. Moderate central canal stenosis at C3-4 and C4-5. Electronically signed by: Lonni Necessary MD 11/22/2023 01:05 PM EDT RP Workstation: [id number]   CT Head Wo Contrast Result Date: 11/22/2023 EXAM: CT HEAD WITHOUT CONTRAST 11/22/2023 12:51:41 PM TECHNIQUE: CT of the head was  performed without the administration of intravenous contrast. Automated exposure control, iterative reconstruction, and/or weight based adjustment of the mA/kV was utilized to reduce the radiation dose to as low as reasonably achievable. COMPARISON: CT head without contrast 09/26/2023. CLINICAL HISTORY: Fall. Pt to ED with wife for weakness since yesterday. Pt slid out of recliner yesterday and twice today and could not get up today. Pt had rectal biopsy 3 days ago. Restricted on L arm. Hx kidney transplant, immunosuppressed. BP 88/48, bringing to room now. FINDINGS: BRAIN AND VENTRICLES: No acute hemorrhage. No evidence of acute infarct. No hydrocephalus. No extra-axial collection. No mass effect or midline shift. Periventricular and scattered subcortical white matter hypoattenuation is moderately advanced for age, stable from the prior study. ORBITS: No acute abnormality. SINUSES: Left ethmoid air cells are opacified. The paranasal sinuses and mastoid air cells are otherwise clear. SOFT TISSUES AND SKULL: No acute soft tissue abnormality. No skull fracture. IMPRESSION: 1. No acute intracranial abnormality. 2. Moderately advanced periventricular and scattered subcortical white matter hypoattenuation, stable from the prior study. Electronically signed by: Lonni Necessary MD 11/22/2023 01:00 PM EDT RP Workstation: [id number]   DG Chest Port 1 View if patient is in a treatment room. Result Date: 11/22/2023 EXAM: 1 VIEW XRAY OF THE CHEST 11/22/2023 10:59:00 AM COMPARISON: 11/12/2023 CLINICAL HISTORY: Suspected Sepsis. Table formatting from the original note was not included.; Pt to ED with wife for weakness since yesterday. Pt slid out of recliner yesterday and twice today and could not get up today. Pt had rectal biopsy 3 days ago. Restricted on L arm. Hx kidney transplant, immunosuppressed. BP 88/48, bringing to room now. FINDINGS: LUNGS AND PLEURA: Stable pulmonary vascular congestion . No focal pulmonary  opacity. No pneumothorax. Possible trace left pleural effusion. HEART AND MEDIASTINUM: Stable cardiomegaly. Calcified right hilar and paratracheal adenopathy. CABG markers. Sternotomy wires. BONES AND SOFT TISSUES: No acute osseous abnormality. IMPRESSION: 1. Stable cardiomegaly and pulmonary vascular congestion. Electronically signed by: Dayne Hassell MD 11/22/2023 11:04 AM EDT RP Workstation: [id number]     Medications:    ceFEPime  (MAXIPIME ) IV 2 g (11/23/23 1219)   lactated ringers  75 mL/hr at 11/23/23 2051   metronidazole  500 mg (11/23/23 2331)   vancomycin       apixaban   5 mg Oral BID   buPROPion   150 mg Oral BID   Chlorhexidine  Gluconate Cloth  6 each Topical Daily   colchicine   0.6 mg Oral QODAY   insulin  aspart  0-5 Units Subcutaneous QHS   insulin  aspart  0-6 Units Subcutaneous TID WC   insulin  glargine  14 Units Subcutaneous Daily   montelukast   10 mg Oral QHS   mycophenolate   360 mg Oral q morning   And   mycophenolate   180 mg Oral QPM   predniSONE   5 mg Oral Daily   rosuvastatin   10 mg Oral Daily   tacrolimus   2 mg Oral q morning   And   tacrolimus   1 mg Oral QPM   tamsulosin   0.4 mg Oral Daily   acetaminophen  **OR** acetaminophen , albuterol , morphine  injection, oxyCODONE -acetaminophen , polyethylene glycol  Assessment/ Plan:  63 y.o. male with past  medical history of ESRD status post renal transplantation in 2012, chronic systolic heart failure ejection fraction 40 to 45%, atrial fibrillation, coronary disease, obesity, aortic stenosis, history of BPH, chronic venous stasis of both lower extremities, diverticulosis, erectile dysfunction, hyperlipidemia, depression, obstructive sleep apnea, pulmonary artery hypertension, status post gastric bypass, diabetes mellitus type 2 who presented status post fall and generalized weakness.  Patient status post recent hemorrhoid removal as well as rectal biopsy.  He also had anal wart ablation.  1.  Acute kidney injury/chronic  kidney disease stage IIIb with history of ESRD status post deceased donor kidney transplant 08/2010 with history of diabetic nephropathy on biopsy in 2017.  Patient had poor p.o. intake status post surgery.  Suspect acute kidney injury due to volume depletion.   Update: Renal ultrasound performed.  There is mild right-sided hydronephrosis.  Patient is producing urine.  Creatinine down to 2.8.  This is improved but still above his prior baseline of 2.04.  Continue hydration for now.  No immediate need for dialysis.  Continue current doses of tacrolimus , prednisone , and mycophenolate .  2.  Anemia of chronic kidney disease.  Hemoglobin currently 10.0.  No immediate need for Epogen.  Continue to monitor for while admitted.    LOS: 2 Jaretssi Kraker 9/1/20259:42 AM

## 2023-11-24 NOTE — Plan of Care (Signed)
  Problem: Clinical Measurements: Goal: Diagnostic test results will improve Outcome: Progressing Goal: Signs and symptoms of infection will decrease Outcome: Progressing

## 2023-11-24 NOTE — Progress Notes (Signed)
 Pharmacy Antibiotic Note  [name]. is a 63 y.o. male admitted on 11/22/2023 with an infection of unknown source.  Patient presents to ED with generalized weakness. No diarrhea or urinary symptoms reported. Pharmacy has been consulted for vancomycin  and cefepime  dosing x 7 days.  Plan: 9/01@0405 : VR=12 Patient's Scr continues to improve(3.87>3.57>2.87), near baseline. Will transition to AUC dosing for Vancomycin . Vancomycin  1500mg  Q48 hours Calculated AUC: 522.9 Calculated Cmin: 11.2 Scr: 2.87, Vd used: 0.5(BMI~35) Continue cefepime  2 g IV Q24H x 7 days Patient is also on metronidazole  500 mg IV Q12H x 7 days Continue to monitor renal function and follow culture results   Height: 5' 10 (177.8 cm) Weight: 107.5 kg (236 lb 15.9 oz) IBW/kg (Calculated) : 73  Temp (24hrs), Avg:98.2 F (36.8 C), Min:98 F (36.7 C), Max:98.6 F (37 C)  Recent Labs  Lab 11/22/23 1052 11/22/23 1140 11/22/23 1311 11/23/23 0453 11/24/23 0405  WBC 4.6  --   --  3.9* 3.3*  CREATININE  --  3.87*  --  3.57* 2.87*  LATICACIDVEN 2.1*  --  1.3  --   --   VANCORANDOM  --   --   --   --  12    Estimated Creatinine Clearance: 31.9 mL/min (A) (by C-G formula based on SCr of 2.87 mg/dL (H)).    Allergies  Allergen Reactions   Iodinated Contrast Media Other (See Comments)    Kidney transplant  Iodinated contrast media (substance)   Iodine Other (See Comments)    Kidney transplant   Nsaids Other (See Comments)    Kidney transplant   Ibuprofen Other (See Comments)    Due to kidney transplant    Antimicrobials this admission: 8/30 cefepime  >>  8/30 metronidazole  >>  8/30 vancomycin >>  Microbiology results: 08/30 BCx: NGTD 09/01 MRSA PCR: negative  Thank you for allowing pharmacy to be a part of this patient's care.  [name], PharmD Clinical Pharmacist 11/24/2023 6:01 AM

## 2023-11-24 NOTE — TOC Progression Note (Signed)
 Transition of Care (TOC) - Progression Note    Patient Details  Name: [name]. MRN: [id number] Date of Birth: 11-10-1960  Transition of Care [facility]) CM/SW Contact  Lorraine LILLETTE Fenton, LCSW Phone Number: 11/24/2023, 3:58 PM  Clinical Narrative:    CSW visitd pt at bedside, spouse was present as well. Discussed the SNF recommendation, versus HHPT.  Pt stated that he is interested in inpatient to try to get better.  CSW explored choice with pt and spouse, they did not know of providers, CSW printed list of providers from medicare.gov for pt. CSW advised that a ICM peer will reach out to them on Tuesday and get their choice- then move forward with Auth process. ICM following.   Expected Discharge Plan: Skilled Nursing Facility Barriers to Discharge: Continued Medical Work up               Expected Discharge Plan and Services In-house Referral: Clinical Social Work   Post Acute Care Choice: Skilled Nursing Facility                                         Social Drivers of Health (SDOH) Interventions SDOH Screenings   Food Insecurity: No Food Insecurity (11/22/2023)  Housing: Low Risk  (11/22/2023)  Transportation Needs: No Transportation Needs (11/22/2023)  Utilities: Not At Risk (11/22/2023)  Depression (PHQ2-9): Low Risk  (11/12/2023)  Recent Concern: Depression (PHQ2-9) - High Risk (10/01/2023)  Social Connections: Socially Integrated (04/11/2023)  Tobacco Use: Low Risk  (11/22/2023)  Recent Concern: Tobacco Use - Medium Risk (11/04/2023)    Readmission Risk Interventions    04/09/2023   12:05 PM 06/24/2022    9:39 AM 11/04/2021   11:44 AM  Readmission Risk Prevention Plan  Transportation Screening Complete Complete Complete  HRI or Home Care Consult  --   Social Work Consult for Recovery Care Planning/Counseling  Complete   Palliative Care Screening  Not Applicable   Medication Review Oceanographer) Complete Complete Complete  PCP or Specialist  appointment within 3-5 days of discharge Complete  Complete  HRI or Home Care Consult   Complete  SW Recovery Care/Counseling Consult Complete  Complete  Palliative Care Screening Not Applicable  Not Applicable  Skilled Nursing Facility Not Applicable  Not Applicable

## 2023-11-24 NOTE — Progress Notes (Addendum)
 Mobility Specialist - Progress Note   11/24/23 1219  Mobility  Activity Stood at bedside;Pivoted/transferred from chair to bed  Level of Assistance Maximum assist, patient does 25-49%  Assistive Device Front wheel walker  Distance Ambulated (ft) 5 ft  Activity Response Tolerated well  Mobility visit 1 Mobility  Mobility Specialist Start Time (ACUTE ONLY) 1150  Mobility Specialist Stop Time (ACUTE ONLY) 1200  Mobility Specialist Time Calculation (min) (ACUTE ONLY) 10 min     Pt sitting in recliner upon arrival, utilizing RA. Pt reporting pain 10/10 in L knee and reports having broken L shoulder. Unable to stand with maxA +1 on 3 different attempts. Session restarted after RN administered pain meds. Pain 8/10 after meds. 2nd assist called into room. Pt stood with maxA +2 on 2nd attempt. Pt took several steps towards bed with slow, guarded pace. Pt returned to bed with assist on LE. Pt experiencing chills and shivering at end of session with slightly warmth to back of L knee. RN notified. Pt left in bed with alarm set, needs in reach.    Lennette Seip Mobility Specialist 11/24/23, 12:44 PM

## 2023-11-25 ENCOUNTER — Other Ambulatory Visit: Payer: Self-pay | Admitting: Urology

## 2023-11-25 ENCOUNTER — Other Ambulatory Visit: Payer: Self-pay

## 2023-11-25 DIAGNOSIS — N179 Acute kidney failure, unspecified: Secondary | ICD-10-CM | POA: Diagnosis not present

## 2023-11-25 DIAGNOSIS — E349 Endocrine disorder, unspecified: Secondary | ICD-10-CM

## 2023-11-25 LAB — CBC WITH DIFFERENTIAL/PLATELET
Abs Immature Granulocytes: 0.02 K/uL (ref 0.00–0.07)
Basophils Absolute: 0 K/uL (ref 0.0–0.1)
Basophils Relative: 0 %
Eosinophils Absolute: 0 K/uL (ref 0.0–0.5)
Eosinophils Relative: 1 %
HCT: 33.2 % — ABNORMAL LOW (ref 39.0–52.0)
Hemoglobin: 10.3 g/dL — ABNORMAL LOW (ref 13.0–17.0)
Immature Granulocytes: 1 %
Lymphocytes Relative: 16 %
Lymphs Abs: 0.5 K/uL — ABNORMAL LOW (ref 0.7–4.0)
MCH: 28.8 pg (ref 26.0–34.0)
MCHC: 31 g/dL (ref 30.0–36.0)
MCV: 92.7 fL (ref 80.0–100.0)
Monocytes Absolute: 0.4 K/uL (ref 0.1–1.0)
Monocytes Relative: 11 %
Neutro Abs: 2.4 K/uL (ref 1.7–7.7)
Neutrophils Relative %: 71 %
Platelets: 101 K/uL — ABNORMAL LOW (ref 150–400)
RBC: 3.58 MIL/uL — ABNORMAL LOW (ref 4.22–5.81)
RDW: 15.1 % (ref 11.5–15.5)
WBC: 3.4 K/uL — ABNORMAL LOW (ref 4.0–10.5)
nRBC: 0 % (ref 0.0–0.2)

## 2023-11-25 LAB — SYNOVIAL CELL COUNT + DIFF, W/ CRYSTALS
Crystals, Fluid: NONE SEEN
Eosinophils-Synovial: 0 %
Lymphocytes-Synovial Fld: 0 %
Monocyte-Macrophage-Synovial Fluid: 3 %
Neutrophil, Synovial: 97 %
WBC, Synovial: 11286 /mm3 — ABNORMAL HIGH (ref 0–200)

## 2023-11-25 LAB — BASIC METABOLIC PANEL WITH GFR
Anion gap: 14 (ref 5–15)
BUN: 82 mg/dL — ABNORMAL HIGH (ref 8–23)
CO2: 29 mmol/L (ref 22–32)
Calcium: 8.5 mg/dL — ABNORMAL LOW (ref 8.9–10.3)
Chloride: 104 mmol/L (ref 98–111)
Creatinine, Ser: 2.41 mg/dL — ABNORMAL HIGH (ref 0.61–1.24)
GFR, Estimated: 29 mL/min — ABNORMAL LOW (ref >60)
Glucose, Bld: 130 mg/dL — ABNORMAL HIGH (ref 70–99)
Potassium: 3.2 mmol/L — ABNORMAL LOW (ref 3.5–5.1)
Sodium: 144 mmol/L (ref 135–145)

## 2023-11-25 LAB — GLUCOSE, CAPILLARY
Glucose-Capillary: 98 mg/dL (ref 70–99)
Glucose-Capillary: 111 mg/dL — ABNORMAL HIGH (ref 70–99)
Glucose-Capillary: 170 mg/dL — ABNORMAL HIGH (ref 70–99)
Glucose-Capillary: 137 mg/dL — ABNORMAL HIGH (ref 70–99)

## 2023-11-25 MED ORDER — SODIUM CHLORIDE 0.9 % IV SOLN
2.0000 g | INTRAVENOUS | Status: AC
  Administered 2023-11-25 – 2023-11-26 (×2): 2 g via INTRAVENOUS
  Filled 2023-11-25 (×2): qty 20

## 2023-11-25 MED ORDER — POTASSIUM CHLORIDE CRYS ER 20 MEQ PO TBCR
40.0000 meq | EXTENDED_RELEASE_TABLET | Freq: Once | ORAL | Status: AC
Start: 2023-11-25 — End: 2023-11-25
  Administered 2023-11-25: 40 meq via ORAL
  Filled 2023-11-25: qty 2

## 2023-11-25 MED ORDER — TRIAMCINOLONE ACETONIDE 40 MG/ML IJ SUSP
80.0000 mg | Freq: Once | INTRAMUSCULAR | Status: DC
  Filled 2023-11-25: qty 2

## 2023-11-25 MED ORDER — BUPIVACAINE HCL (PF) 0.5 % IJ SOLN
10.0000 mL | Freq: Once | INTRAMUSCULAR | Status: DC
  Filled 2023-11-25: qty 10

## 2023-11-25 NOTE — Progress Notes (Signed)
 Occupational Therapy Treatment Patient Details Name: [name]. MRN: [id number] DOB: 1961-01-14 Today's Date: 11/25/2023   History of present illness [name]. is a pleasant 63 y.o. male with medical history significant for history of ESRD s/p renal transplant in 2012, currently on immunosuppression therapy, history of heart failure last EF was 40 to 45% in July 2025, on torsemide , A-fib on Eliquis , CAD, obesity on CPAP who presented to ED at [facility] today after a fall at home and generalized weakness.   OT comments  Chart reviewed to date, pt greeted semi supine in bed, agreeable to OT tx session targeting improving functional activity tolerance in prep for ADL tasks. Pt continues to require increased assist compared to baseline in for ADL/functional mobility. Please see further details below. Blood noted on chux after STS- nurse notified. Pt is left in chair, all needs met. OT will continue to follow.       If plan is discharge home, recommend the following:  A lot of help with walking and/or transfers;A lot of help with bathing/dressing/bathroom;Assistance with cooking/housework;Assist for transportation;Help with stairs or ramp for entrance   Equipment Recommendations  Other (comment) (defer to next venue of care)    Recommendations for Other Services      Precautions / Restrictions Precautions Precautions: Fall Precaution/Restrictions Comments: old left shoulder fx early June Restrictions Weight Bearing Restrictions Per Provider Order: No       Mobility Bed Mobility Overal bed mobility: Needs Assistance Bed Mobility: Supine to Sit     Supine to sit: Mod assist, Max assist, HOB elevated          Transfers Overall transfer level: Needs assistance Equipment used: Rolling walker (2 wheels) Transfers: Sit to/from Stand Sit to Stand: Mod assist, From elevated surface                 Balance Overall balance assessment: Needs  assistance Sitting-balance support: No upper extremity supported, Feet supported Sitting balance-Leahy Scale: Good     Standing balance support: Bilateral upper extremity supported, Reliant on assistive device for balance, During functional activity Standing balance-Leahy Scale: Poor                             ADL either performed or assessed with clinical judgement   ADL Overall ADL's : Needs assistance/impaired Eating/Feeding: Set up;Sitting   Grooming: Sitting;Set up               Lower Body Dressing: Maximal assistance   Toilet Transfer: Moderate assistance;Rolling walker (2 wheels);Minimal assistance Toilet Transfer Details (indicate cue type and reason): short amb transfer to bedside chair                Extremity/Trunk Assessment              Vision       Perception     Praxis     Communication Communication Communication: No apparent difficulties   Cognition Arousal: Alert Behavior During Therapy: WFL for tasks assessed/performed Cognition: No apparent impairments                               Following commands: Intact        Cueing   Cueing Techniques: Verbal cues  Exercises Other Exercises Other Exercises: edu re role of OT, role of rehab, continued importance of progressing mobility    Shoulder Instructions  General Comments vss throughout; pt with continued bleeding from rectum, nurse notified    Pertinent Vitals/ Pain       Pain Assessment Pain Assessment: 0-10 Pain Score: 5  Pain Location: L knee, L shoulder Pain Descriptors / Indicators: Aching, Discomfort, Grimacing, Moaning Pain Intervention(s): Monitored during session, Limited activity within patient's tolerance, Repositioned  Home Living                                          Prior Functioning/Environment              Frequency  Min 2X/week        Progress Toward Goals  OT Goals(current goals can  now be found in the care plan section)  Progress towards OT goals: Progressing toward goals  Acute Rehab OT Goals Time For Goal Achievement: 12/07/23  Plan      Co-evaluation                 AM-PAC OT 6 Clicks Daily Activity     Outcome Measure   Help from another person eating meals?: None Help from another person taking care of personal grooming?: A Little Help from another person toileting, which includes using toliet, bedpan, or urinal?: A Lot Help from another person bathing (including washing, rinsing, drying)?: A Lot Help from another person to put on and taking off regular upper body clothing?: A Lot Help from another person to put on and taking off regular lower body clothing?: A Lot 6 Click Score: 15    End of Session Equipment Utilized During Treatment: Gait belt;Rolling walker (2 wheels)  OT Visit Diagnosis: Unsteadiness on feet (R26.81);Repeated falls (R29.6);Muscle weakness (generalized) (M62.81);Pain   Activity Tolerance Patient limited by pain   Patient Left with call bell/phone within reach;in chair;with chair alarm set   Nurse Communication Mobility status        Time: 8587-8560 OT Time Calculation (min): 27 min  Charges: OT General Charges $OT Visit: 1 Visit OT Treatments $Self Care/Home Management : 8-22 mins $Therapeutic Activity: 8-22 mins Therisa Sheffield, OTD OTR/L  11/25/23, 4:06 PM

## 2023-11-25 NOTE — Progress Notes (Signed)
 Physical Therapy Treatment Patient Details Name: [name]. MRN: [id number] DOB: Nov 23, 1960 Today's Date: 11/25/2023   History of Present Illness [name]. is a pleasant 63 y.o. male with medical history significant for history of ESRD s/p renal transplant in 2012, currently on immunosuppression therapy, history of heart failure last EF was 40 to 45% in July 2025, on torsemide , A-fib on Eliquis , CAD, obesity on CPAP who presented to ED at [facility] today after a fall at home and generalized weakness.    PT Comments  Pt ready for session. He has been up in chair x 2.5 hours and ready to return to bed to elevate LE's.  Assisted tech with bathing prior to return.  Stands from recliner with difficulty with heavy mod a x 2 to RW.  Is able to take several small steps to bed with min a x 2 for safety with heavy lean on RW.  Upon turning he stated he needed to have BM.  No BSC is available in room and he opts to sit on bedpan.  No BM noted but upon standing from recliner, turning and on bedpan he does have some bright red blood.  Tech stated it is less than this AM.  Pt reported recent rectal surgery at [facility] which he attributes to bleeding.  Tech stated RN is aware.  He stands from elevated bed with max a x 1 for care before sitting back down for seated AROM,  Pain remains primary barrier.  Pt reported MRI was done yesterday but results not back yet.  He also has healing L humeral fracture from early June which remains painful and limits his ability to help with sit to stand and walker use.  Pt stated he is unaware of any WB restrictions or need to use sling at this time.  Returned to supine with mod a x 1 for BLE's.    If plan is discharge home, recommend the following: A lot of help with bathing/dressing/bathroom;Assistance with cooking/housework;Assist for transportation;Help with stairs or ramp for entrance;Two people to help with walking and/or transfers   Can travel by private vehicle         Equipment Recommendations       Recommendations for Other Services       Precautions / Restrictions Precautions Precautions: Fall Precaution/Restrictions Comments: old left hsoulder fx early June Restrictions Weight Bearing Restrictions Per Provider Order: No     Mobility  Bed Mobility Overal bed mobility: Needs Assistance Bed Mobility: Sit to Supine       Sit to supine: Mod assist     Patient Response: Cooperative  Transfers Overall transfer level: Needs assistance Equipment used: Rolling walker (2 wheels) Transfers: Sit to/from Stand, Bed to chair/wheelchair/BSC Sit to Stand: Mod assist, +2 physical assistance Stand pivot transfers: Min assist, +2 physical assistance, +2 safety/equipment         General transfer comment: used R hand on walker for support, resting L hand on walker    Ambulation/Gait Ambulation/Gait assistance: Min assist, +2 physical assistance Gait Distance (Feet): 2 Feet Assistive device: Rolling walker (2 wheels) Gait Pattern/deviations: Step-to pattern Gait velocity: dec     General Gait Details: step to shuffle   Stairs             Wheelchair Mobility     Tilt Bed Tilt Bed Patient Response: Cooperative  Modified Rankin (Stroke Patients Only)       Balance Overall balance assessment: Needs assistance Sitting-balance support: No upper extremity  supported, Feet supported Sitting balance-Leahy Scale: Good     Standing balance support: Bilateral upper extremity supported Standing balance-Leahy Scale: Poor Standing balance comment: limited due to R knee pain                            Communication Communication Communication: No apparent difficulties  Cognition Arousal: Alert Behavior During Therapy: WFL for tasks assessed/performed   PT - Cognitive impairments: No apparent impairments                         Following commands: Intact      Cueing Cueing Techniques: Verbal  cues  Exercises Other Exercises Other Exercises: seated AROM BLE    General Comments        Pertinent Vitals/Pain Pain Assessment Faces Pain Scale: Hurts whole lot Pain Location: L knee primarily, less L shoulder Pain Descriptors / Indicators: Aching, Discomfort, Grimacing, Moaning Pain Intervention(s): Limited activity within patient's tolerance, Monitored during session, Premedicated before session, Repositioned    Home Living                          Prior Function            PT Goals (current goals can now be found in the care plan section) Progress towards PT goals: Progressing toward goals    Frequency    Min 3X/week      PT Plan      Co-evaluation              AM-PAC PT 6 Clicks Mobility   Outcome Measure  Help needed turning from your back to your side while in a flat bed without using bedrails?: A Lot Help needed moving from lying on your back to sitting on the side of a flat bed without using bedrails?: A Lot Help needed moving to and from a bed to a chair (including a wheelchair)?: A Lot Help needed standing up from a chair using your arms (e.g., wheelchair or bedside chair)?: A Lot Help needed to walk in hospital room?: Total Help needed climbing 3-5 steps with a railing? : Total 6 Click Score: 10    End of Session Equipment Utilized During Treatment: Gait belt Activity Tolerance: Patient limited by pain Patient left: in bed;with call bell/phone within reach;with bed alarm set Nurse Communication: Mobility status PT Visit Diagnosis: Muscle weakness (generalized) (M62.81);History of falling (Z91.81);Difficulty in walking, not elsewhere classified (R26.2);Pain Pain - Right/Left: Left Pain - part of body: Shoulder;Knee     Time: 9075-9045 PT Time Calculation (min) (ACUTE ONLY): 30 min  Charges:    $Therapeutic Exercise: 8-22 mins $Therapeutic Activity: 8-22 mins PT General Charges $$ ACUTE PT VISIT: 1 Visit                    Lauraine Gills, PTA 11/25/23, 10:33 AM

## 2023-11-25 NOTE — Plan of Care (Signed)

## 2023-11-25 NOTE — TOC PASRR Note (Signed)
 RE: [name] Date of Birth: [birth year] Date: 11/25/2023   To Whom It May Concern:  Please be advised that the above-named patient will require a short-term nursing home stay - anticipated 30 days or less for rehabilitation and strengthening.  The plan is for return home.

## 2023-11-25 NOTE — Consult Note (Signed)
 ORTHOPAEDIC CONSULTATION  REQUESTING PHYSICIAN: Dorinda Drue DASEN, MD  Chief Complaint:   Left knee pain and swelling  History of Present Illness: [name]. is a 63 y.o. male with multiple medical problems including coronary artery disease, cardiomegaly, chronic combined systolic and diastolic congestive heart failure, chronic atrial fibrillation, obesity, pulmonary artery hypertension, hyperlipidemia, hypertension, end-stage renal disease, gout, diabetes, and chronic venous stasis dermatitis of both lower extremities who normally lives at home with his wife.  The patient notes several episodes of feeling as though his legs gave out on him, causing him to fall, and prompting him to be brought to the emergency room where he subsequently was admitted for further workup.  As part of this workup, the patient notes he has been had increased pain and swelling in his left knee which he feels is due to the several falls that he has sustained recently.  Apparently, the physical therapist tried to mobilize him 2 days ago without success, so an MRI scan of his left knee was obtained.  By report, the study demonstrated evidence of a large effusion and a medial meniscal tear, but no ligamentous pathology or fractures were identified.  An attempt was made by physical therapy to try to mobilize the patient again this morning without success.  Therefore, orthopedic consultation was requested.  Past Medical History:  Diagnosis Date   Anal condyloma    Anemia    Anxiety    Aortic atherosclerosis (HCC)    Aortic stenosis    a.) TTE 10/2021: Mod AS. AVA 1.06cm^2 (VTI). Mean grad ; b.) TTE 04/02/2022: no AV stenosis   Asthma, persistent controlled 02/25/2013   Attention or concentration deficit 04/26/2021   BPH with obstruction/lower urinary tract symptoms 09/25/2014   CAD (coronary artery disease) 2005   a.) LHC 1996 -> HG stenosis  mLAD -> PTCA/PCI with stent x 1 (unk type) -> complicated by CFA pseudoanurysm (required surg);  b.) LHC 08/10/2002  -> 95% LAD, 70% ISR LAD, 80% pOM, 70% mRCA --> CVTS consult; c.) 4v CABG 08/13/2002; c.) 03/2018 MV: Small, fixed inferoapical and apical sep defect. No isc. EF 47% (60-65% by 05/2018 TTE); d.) 02/2021 MV: small Apical inf fixed defect. No isc -> low risk.   Cardiomegaly    Cellulitis and abscess of left leg 07/22/2020   Chronic atrial fibrillation    a.) CHA2DS2VASc = 4 (CHF, HTN, vascular disease history, T2DM);  b.) rate/rhythm maintained without pharmacological intervention; chronically anticoagulated with apixaban    Chronic combined systolic and diastolic CHF (congestive heart failure)    a.) TTE 7/18 : EF 45-50%; b.) TTE 05/2018 : EF 60-65%, RVSP 74.8; c.) TTE 12/2018: EF 50-55%. Sev dil LA; d.) TTE 04/2019: EF 45-50%; e.) TTE 07/2021: EF 40-45%, G3DD; f.) TTE 10/2021: EF 40-45%, mild conc LVH, septal-lat dyssynchrony (LBBB), mildly red RVSF, mild-mod dil LA, mildly dil RA, mild-mod MS, mod AS; g.) TTE 04/02/2022: EF 40-45%, glob HK, LVH, red RVSF, RVE, sev LAE, mild-mod RAE, mild MR   Chronic venous stasis dermatitis of both lower extremities    Cytomegaloviral disease 2017   Diverticulosis of colon 04/22/2013   Elevated RIGHT hemidiaphragm    Erectile dysfunction    a.) on topical TRT + Trimix (alprostadil/papaverine/phentolamine) injections   ESRD (end stage renal disease) (HCC)    a.) dialysis dependent 2004-2012; b.) s/p cadaveric RIGHT renal transplant 09/13/2010   Essential hypertension 12/17/2010   Gout    History of 2019 novel coronavirus disease (COVID-19) 06/30/2019  History of bilateral cataract extraction 10/2017   History of renal transplant 09/13/2010   a.) s/p cadaveric donor transplant 09/13/2010   Hx of bilateral cataract extraction 10/2017   Hyperlipidemia    Hypogonadism in male 09/25/2014   Ischemic cardiomyopathy    Left cephalic vein thrombosis 06/2019    Long term current use of anticoagulant    a.) apixaban    Long term current use of immunosuppressive drug    a.) mycophenolate  + prednisone  + tacrolimus    Major depressive disorder 10/04/2013   Mitral stenosis 11/07/2021   a.) TTE 11/07/2021: severe MAC, mild-mod MS (MPG 6 mmHg); b.) TTE 04/02/2022: no MV stenosis   Murmur    Obesity hypoventilation syndrome 03/31/2012   OSA treated with BiPAP 09/29/2012   PAH (pulmonary artery hypertension)    a. 04/2019 RHC: RA 19, RV 80/20, PA 78/31 (51), PCWP 25, CO/CI 7.69/3.26. PVR 3.25 --> Sev PAH, likely primarily PV HTN; b.) RHC 04/04/2022: mRA 13, mPA 40, mPCWP 20, PA sat 59, AO sat 92, CO 7.63, CI 3.26, PVR 2.6 --> mod portal venous HTN   Pancreatitis    S/P CABG x 4 08/13/2002   a.) LIMA-LAD, LRA-OM, SVG-D1, SVG-dRCA   S/P gastric bypass    Synovitis of finger 06/11/2019   Trigger finger, unspecified little finger 12/23/2018   Type 2 diabetes mellitus with hyperglycemia, with long-term current use of insulin  09/09/2014   a.) has FreeStyle Libre CGM   Ulcer    Left shin goes to wound care center at [facility] qweek   Wears glasses    Wears hearing aid in both ears    Past Surgical History:  Procedure Laterality Date   CARDIAC CATHETERIZATION N/A 08/10/2002   CATARACT EXTRACTION W/PHACO Right 10/29/2017   Procedure: CATARACT EXTRACTION PHACO AND INTRAOCULAR LENS PLACEMENT (IOC)  RIGHT DIABETIC;  Surgeon: Mittie Gaskin, MD;  Location: [facility];  Service: Ophthalmology;  Laterality: Right   CATARACT EXTRACTION W/PHACO Left 11/18/2017   Procedure: CATARACT EXTRACTION PHACO AND INTRAOCULAR LENS PLACEMENT (IOC) LEFT IVA/TOPICAL;  Surgeon: Mittie Gaskin, MD;  Location: [facility];  Service: Ophthalmology;  Laterality: Left   COLONOSCOPY WITH PROPOFOL  N/A 04/22/2013   Procedure: COLONOSCOPY WITH PROPOFOL ;  Surgeon: Toribio SHAUNNA Cedar, MD;  Location: [facility];  Service: Endoscopy;  Laterality: N/A;   CORONARY  ANGIOPLASTY WITH STENT PLACEMENT N/A 1996   CORONARY ARTERY BYPASS GRAFT N/A 08/13/2002   Procedure: 4v CORONARY ARTERY BYPASS GRAFTING; Location: [facility]; Surgeon: Dessa Laine, MD   CYSTOSCOPY WITH INSERTION OF UROLIFT N/A 12/25/2021   Procedure: CYSTOSCOPY WITH INSERTION OF UROLIFT;  Surgeon: Twylla Glendia BROCKS, MD;  Location: [facility];  Service: Urology;  Laterality: N/A;   DG ANGIO AV SHUNT*L*     right and left upper arms   FASCIOTOMY  03/03/2012   Procedure: FASCIOTOMY;  Surgeon: Arley JONELLE Curia, MD;  Location: [facility];  Service: Orthopedics;  Laterality: Right;  FASCIOTOMY RIGHT SMALL FINGER   FASCIOTOMY Left 08/17/2013   Procedure: FASCIOTOMY LEFT RING;  Surgeon: Arley JONELLE Curia, MD;  Location: [facility];  Service: Orthopedics;  Laterality: Left;   INCISION AND DRAINAGE ABSCESS Left 10/15/2015   Procedure: INCISION AND DRAINAGE ABSCESS;  Surgeon: Laneta JULIANNA Luna, MD;  Location: [facility];  Service: General;  Laterality: Left;   KIDNEY TRANSPLANT  09/13/2010   cadaver--at [facility]   LASER ABLATION CONDOLAMATA N/A 01/08/2023   Procedure: LASER REMOVAL ABLATION OF CONDYLOMATA;  Surgeon: Sheldon Standing, MD;  Location: [facility];  Service: General;  Laterality: N/A;  GEN AND LOCAL   LASER ABLATION CONDOLAMATA N/A 11/20/2023   Procedure: ABLATION, CONDYLOMA, USING LASER HEMORRHOIDAL LIGATION & HEMORRHOIDOPEXY, HEMORRHOIDECTOMY x 2;  Surgeon: Sheldon Standing, MD;  Location: [facility];  Service: General;  Laterality: N/A;  LASER REMOVAL ABLATION OF CONDYLOMATA ANORECTAL EXAMINATION UNDER ANESTHESIA   RECTAL EXAM UNDER ANESTHESIA N/A 01/08/2023   Procedure: ANORECTAL EXAM UNDER ANESTHESIA;  Surgeon: Sheldon Standing, MD;  Location: [facility];  Service: General;  Laterality: N/A;   RECTAL EXAM UNDER ANESTHESIA N/A 11/20/2023   Procedure: EXAM UNDER ANESTHESIA, RECTUM;  Surgeon: Sheldon Standing, MD;  Location: [facility];  Service: General;  Laterality: N/A;   RIGHT HEART CATH N/A 11/15/2016    Procedure: RIGHT HEART CATH;  Surgeon: Darron Deatrice LABOR, MD;  Location: [facility];  Service: Cardiovascular;  Laterality: N/A;   RIGHT HEART CATH N/A 05/03/2019   Procedure: RIGHT HEART CATH;  Surgeon: [name], MD;  Location: [facility];  Service: Cardiovascular;  Laterality: N/A;   RIGHT HEART CATH N/A 04/04/2022   Procedure: RIGHT HEART CATH;  Surgeon: [name], MD;  Location: [facility];  Service: Cardiovascular;  Laterality: N/A;   ROUX-EN-Y GASTRIC BYPASS N/A 2015   TYMPANIC MEMBRANE REPAIR Left 03/2010   VASECTOMY     WART FULGURATION N/A 05/31/2022   Procedure: FULGURATION ANAL WART;  Surgeon: Tye Millet, DO;  Location: [facility];  Service: General;  Laterality: N/A;   Social History   Socioeconomic History   Marital status: Married    Spouse name: Not on file   Number of children: 2   Years of education: 14   Highest education level: Associate degree: academic program  Occupational History   Occupation: Presenter, broadcasting: unemployed    Comment: disabled due to kidney failure  Tobacco Use   Smoking status: Never   Smokeless tobacco: Never  Vaping Use   Vaping status: Never Used  Substance and Sexual Activity   Alcohol use: Not Currently    Comment: occ   Drug use: No   Sexual activity: Not on file  Other Topics Concern   Not on file  Social History Narrative   Has living will   Wife is health care POA---son Donnice is alternate   Would accept resuscitation attempts   No tube feedings if cognitively unaware   Social Drivers of Corporate investment banker Strain: Not on file  Food Insecurity: No Food Insecurity (11/22/2023)   Hunger Vital Sign    Worried About Running Out of Food in the Last Year: Never true    Ran Out of Food in the Last Year: Never true  Transportation Needs: No Transportation Needs (11/22/2023)   PRAPARE - Administrator, Civil Service (Medical): No    Lack of  Transportation (Non-Medical): No  Physical Activity: Not on file  Stress: Not on file  Social Connections: Socially Integrated (04/11/2023)   Social Connection and Isolation Panel    Frequency of Communication with Friends and Family: More than three times a week    Frequency of Social Gatherings with Friends and Family: More than three times a week    Attends Religious Services: More than 4 times per year    Active Member of [employer] or Organizations: Yes    Attends Engineer, structural: More than 4 times per year    Marital Status: Married   Family History  Problem Relation Age of Onset   Heart disease Father    Kidney failure Father    Kidney disease Father    Diabetes Maternal Grandmother    Breast cancer Maternal Grandmother    Valvular heart disease Mother    Liver cancer Paternal Uncle    Liver cancer Paternal Grandmother    Prostate cancer Neg Hx    Allergies  Allergen Reactions   Iodinated Contrast Media Other (See Comments)    Kidney transplant  Iodinated contrast media (substance)   Iodine Other (See Comments)    Kidney transplant   Nsaids Other (See Comments)    Kidney transplant   Ibuprofen Other (See Comments)    Due to kidney transplant   Prior to Admission medications   Medication Sig Start Date End Date Taking? Authorizing Provider  albuterol  (PROVENTIL ) (2.5 MG/3ML) 0.083% nebulizer solution Take 3 mLs (2.5 mg total) by nebulization every 6 (six) hours as needed for wheezing or shortness of breath. 11/18/22  Yes [name], MD  albuterol  (VENTOLIN  HFA) 108 (90 Base) MCG/ACT inhaler Inhale 2 puffs into the lungs every 6 (six) hours as needed for wheezing or shortness of breath. 11/18/22  Yes [name], MD  apixaban  (ELIQUIS ) 5 MG TABS tablet Take 1 tablet (5 mg total) by mouth 2 (two) times daily. 10/30/23 10/29/24 Yes [name], MD  buPROPion  (WELLBUTRIN  SR) 150 MG 12 hr tablet Take 1 tablet (150 mg total) by mouth 2 (two) times daily.  05/14/23 05/13/24 Yes [name], MD  colchicine  0.6 MG tablet Take 0.6 mg by mouth every other day.   Yes [provider]  Cyanocobalamin  (B-12 PO) Take 1 tablet by mouth daily.   Yes [provider]  dapagliflozin  propanediol (FARXIGA ) 10 MG TABS tablet Take 1 tablet (10 mg total) by mouth daily before breakfast. 10/21/22  Yes [name], MD  diazepam  (VALIUM ) 5 MG tablet Take 1 tablet (5 mg total) by mouth every 8 (eight) hours as needed for muscle spasms (difficulty urinating). 11/20/23  Yes Sheldon Standing, MD  febuxostat  (ULORIC ) 40 MG tablet Take 3 tablets (120 mg total) by mouth daily. 07/30/23  Yes   gabapentin  (NEURONTIN ) 300 MG capsule Take 1 capsule (300 mg total) by mouth 2 (two) times daily. 05/16/23  Yes   insulin  glargine-yfgn (SEMGLEE ) 100 UNIT/ML Pen Inject 20 Units into the skin daily. Patient taking differently: Inject 14 Units into the skin daily. 06/16/23  Yes   losartan  (COZAAR ) 25 MG tablet Take 1 tablet (25 mg total) by mouth daily. 04/21/23  Yes [name], MD  metolazone  (ZAROXOLYN ) 2.5 MG tablet Take 1 tablet (2.5 mg total) by mouth as directed. By the heart failure clinic Patient taking differently: Take 2.5 mg by mouth daily as needed (Edema). 09/22/23 01/05/24 Yes [name], MD  montelukast  (SINGULAIR ) 10 MG tablet Take 1 tablet (10 mg total) by mouth at bedtime. 01/10/23 01/10/24 Yes [name], MD  mycophenolate  (MYFORTIC ) 180 MG EC tablet Take 2 tablets (360 mg total) by mouth every morning AND 1 tablet (180 mg total) every evening. 04/21/23  Yes   omeprazole  (PRILOSEC) 20 MG capsule Take 1 capsule (20 mg total) by mouth daily. 01/09/23 01/09/24 Yes [name], MD  oxyCODONE -acetaminophen  (PERCOCET) 10-325 MG tablet Take 1 tablet by mouth every 6 (six) hours as needed for pain. 11/20/23  Yes Sheldon Standing, MD  potassium chloride  SA (KLOR-CON  M) 20 MEQ tablet Take 1 tablet (20 mEq  total) by mouth daily as needed. 07/29/23   Yes Hayes Beckey CROME, NP  predniSONE  (DELTASONE ) 5 MG tablet Take 1 tablet (5 mg total) by mouth daily. 03/12/23  Yes   rosuvastatin  (CRESTOR ) 10 MG tablet Take 1 tablet (10 mg total) by mouth daily. 10/30/23  Yes [name], MD  sodium chloride  (OCEAN) 0.65 % SOLN nasal spray Place 2 sprays into both nostrils every 8 (eight) hours for 10 days. Patient taking differently: Place 2 sprays into both nostrils as needed for congestion. 09/29/23 10/23/24 Yes Franchot Novel, MD  sulfamethoxazole -trimethoprim  (BACTRIM ) 400-80 MG tablet Take 1 tablet by mouth 3 (three) times a week. 02/14/23  Yes   tacrolimus  (PROGRAF ) 1 MG capsule Take 2 capsules (2 mg total) by mouth every morning AND 1 capsule (1 mg total) every evening. 03/12/23  Yes   tadalafil  (CIALIS ) 5 MG tablet Take 5 mg by mouth daily as needed for erectile dysfunction.   Yes [provider]  tamsulosin  (FLOMAX ) 0.4 MG CAPS capsule Take 1 capsule (0.4 mg total) by mouth daily. 01/16/23  Yes Vaillancourt, Samantha, PA-C  torsemide  (DEMADEX ) 100 MG tablet Take 1 tablet (100 mg total) by mouth 2 (two) times daily. 10/24/23  Yes Milford, Harlene HERO, FNP  Zinc  50 MG TABS Take 1 tablet (50 mg total) by mouth daily. 04/21/23  Yes [name], MD  Continuous Glucose Sensor (FREESTYLE LIBRE 3 SENSOR) MISC Use one every 2 weeks. 01/24/23     fluticasone  (FLONASE ) 50 MCG/ACT nasal spray Place 2 sprays into both nostrils daily. Patient taking differently: Place 2 sprays into both nostrils daily as needed for allergies or rhinitis. 09/09/23   [name], MD  Insulin  Pen Needle (TECHLITE PEN NEEDLES) 31G X 5 MM MISC 4 (four) times daily. 08/21/22   Brendia Calton Squires, PA-C  Insulin  Pen Needle (UNIFINE PENTIPS) 31G X 5 MM MISC Use 4 times daily as directed 05/28/21     silver  sulfADIAZINE  (SILVADENE ) 1 % cream Apply 1 Application topically daily. Patient not taking: Reported on 11/06/2023 09/12/23   [name], MD  tadalafil   (CIALIS ) 20 MG tablet Take 0.5-1 tablets (10-20 mg total) by mouth every other day as needed for erectile dysfunction. Patient not taking: Reported on 11/06/2023 11/04/23   [name], MD  Testosterone  20.25 MG/ACT (1.62%) GEL Place 2 Pump onto the skin daily. Patient not taking: Reported on 11/22/2023 05/14/23   Helon Kirsch A, PA-C  tirzepatide  (MOUNJARO ) 5 MG/0.5ML Pen Inject 5 mg into the skin once a week. 10/30/23     tiZANidine  (ZANAFLEX ) 2 MG tablet Take 1 tablet (2 mg total) by mouth every 6 (six) hours as needed for muscle spasms. Patient not taking: Reported on 11/22/2023 06/24/23   [name], MD   MR KNEE LEFT WO CONTRAST Result Date: 11/25/2023 CLINICAL DATA:  Knee trauma, internal derangement suspected, xray done EXAM: MR KNEE*L* W/O CM TECHNIQUE: Multiplanar, multisequence MR imaging of the knee was performed. No intravenous contrast was administered. COMPARISON:  Left knee radiographs dated 11/23/2022. FINDINGS: MENISCI Medial: Horizontally oriented irregular hyperintense signal at the body and posterior horn of the medial meniscus is suspicious for a tear. Lateral: Discoid morphology of the lateral meniscus without discrete tear. LIGAMENTS Cruciates: ACL and PCL are intact. Collaterals: Medial collateral ligament is intact. Lateral collateral ligament complex is intact. CARTILAGE Patellofemoral:  No chondral defect. Medial: Mild partial-thickness cartilage loss of the medial femorotibial compartment. Lateral:  No chondral defect. JOINT: Moderate-to-large joint effusion.  Edema in Hoffa's fat-pad is likely reactive. POPLITEAL FOSSA: Popliteus tendon is intact. No Baker's cyst. EXTENSOR MECHANISM: Intact quadriceps tendon. Thickening and intermediate signal of the patellar tendon, most compatible with tendinosis. BONES: No aggressive osseous lesion. No fracture or dislocation. Other: Nonspecific subcutaneous edema of the knee, most pronounced posteriorly. No loculated fluid collection.  IMPRESSION: 1. Findings suspicious for horizontal tear of the body and posterior horn of the medial meniscus. 2. Discoid morphology of the lateral meniscus without discrete tear. 3. Moderate to large knee joint effusion. 4. Patellar tendinosis. 5. Mild partial-thickness cartilage loss of the medial femorotibial compartment. 6. Nonspecific subcutaneous edema of the knee, most pronounced posteriorly. Electronically Signed   By: Harrietta Sherry M.D.   On: 11/25/2023 12:25    Positive ROS: All other systems have been reviewed and were otherwise negative with the exception of those mentioned in the HPI and as above.  Physical Exam: General:  Alert, no acute distress Psychiatric:  Patient is competent for consent with normal mood and affect   Cardiovascular:  No pedal edema Respiratory:  No wheezing, non-labored breathing GI:  Abdomen is soft and non-tender Skin:  No lesions in the area of chief complaint Neurologic:  Sensation intact distally Lymphatic:  No axillary or cervical lymphadenopathy  Orthopedic Exam:  Orthopedic examination is limited to the left knee and lower extremity.  Skin inspection around the knee is notable for mild swelling, but otherwise is unremarkable.  No erythema, ecchymosis, abrasions, or other skin abnormalities are identified.  There is a trace to 1+ effusion.  He has moderate focal tenderness to palpation over the anteromedial joint line, but there is no lateral or parapatellar tenderness.  Actively, he is able to extend his knee to within 20 degrees of full extension and flexes knee to 100 degrees.  Passively, the knee can be extended to near 0 degrees and flexed beyond 100 degrees.  His patella tracks well and is without crepitus.  He is grossly neurovascularly intact to the left lower extremity and foot, although significant peripheral vascular skin changes are noted in the lower leg and foot.  X-rays:  A recent MRI scan of the left knee is available for review and has  been reviewed by myself.  By report, the study demonstrates evidence of findings suspicious for horizontal tear of the body posterior horn of medial meniscus as well as for mild partial-thickness cartilage loss of the medial femorotibial compartment.  There also is a moderate to large knee joint effusion.  Both the films and report were reviewed by myself and discussed with the patient.  Assessment: Acute left knee effusion status post fall with MRI findings consistent with early degenerative joint disease and horizontal tear of medial meniscus but no fractures or more significant pathology.  Plan: The treatment options have been discussed with the patient and his wife, who is at the bedside.  The patient is reassured that there does not appear to be any need to consider surgical intervention at this time.  The pattern of medial meniscal tear noted on MRI scan is inherently stable and may be managed nonsurgically, at least initially.  Therefore, the patient is offered and accepts a steroid injection into the left knee.    After obtaining verbal consent, the left knee was aspirated sterilely of 30 cc of primarily bloody fluid before it was injected using a solution of 8 cc of 0.5% plain Sensorcaine  and 2 cc of Kenalog -40 (80 mg).  The patient tolerated the procedure well.  The aspirated fluid has been sent off for cell count differential, crystals, culture and sensitivity, and Gram stain.  The patient may continue to be mobilized with physical therapy as tolerated, weightbearing as tolerated on the left leg.  Thank you for asking me to participate in the care of this most pleasant yet unfortunate man.  I will be happy to follow him with you.   DOROTHA Reyes Maltos, MD  Beeper #:  [phone]  11/25/2023 5:38 PM

## 2023-11-25 NOTE — TOC Initial Note (Addendum)
 Transition of Care (TOC) - Initial/Assessment Note    Patient Details  Name: [name]. MRN: [id number] Date of Birth: 1960-04-27  Transition of Care [facility]) CM/SW Contact:    Lauraine JAYSON Carpen, LCSW Phone Number: 11/25/2023, 10:19 AM  Clinical Narrative: CSW met with patient. No family at bedside. Patient confirmed plan for SNF placement and stated that [facility] is first preference. CSW left message for admissions coordinator asking her to review. Discussed sending referral to other local facilities as a backup plan. Patient is agreeable. PASARR under manual review. Asked MD to [facility] and 30-day note.                 10:40 am: [facility] is not in network with patient's insurance.  11:35 am: Per [facility] liaison, patient is active with them for PT, OT, RN, aide.  1:24 pm: Uploaded requested documents into [place] [facility] for PASARR review.  Expected Discharge Plan: Skilled Nursing Facility Barriers to Discharge: English as a second language teacher, Continued Medical Work up, Engineer, mining)   Patient Goals and CMS Choice Patient states their goals for this hospitalization and ongoing recovery are:: Pt wants SNF to get better then home CMS Medicare.gov Compare Post Acute Care list provided to:: Patient Choice offered to / list presented to : Patient, Spouse (Provided list from [email or web address].)      Expected Discharge Plan and Services In-house Referral: Clinical Social Work   Post Acute Care Choice: Skilled Nursing Facility Living arrangements for the past 2 months: Single Family Home                                      Prior Living Arrangements/Services Living arrangements for the past 2 months: Single Family Home Lives with:: Spouse Patient language and need for interpreter reviewed:: Yes Do you feel safe going back to the place where you live?: Yes      Need for Family Participation in Patient Care: Yes (Comment) Care giver support system  in place?: Yes (comment)   Criminal Activity/Legal Involvement Pertinent to Current Situation/Hospitalization: No - Comment as needed  Activities of Daily Living   ADL Screening (condition at time of admission) Independently performs ADLs?: Yes (appropriate for developmental age) Is the patient deaf or have difficulty hearing?: No Does the patient have difficulty seeing, even when wearing glasses/contacts?: No Does the patient have difficulty concentrating, remembering, or making decisions?: No  Permission Sought/Granted Permission sought to share information with : Facility Industrial/product designer granted to share information with : Yes, Verbal Permission Granted     Permission granted to share info w AGENCY: SNF's        Emotional Assessment Appearance:: Appears stated age Attitude/Demeanor/Rapport: Engaged, Gracious Affect (typically observed): Accepting, Appropriate, Calm, Pleasant Orientation: : Oriented to Self, Oriented to Place, Oriented to  Time, Oriented to Situation Alcohol / Substance Use: Not Applicable Psych Involvement: No (comment)  Admission diagnosis:  Rectal bleeding [K62.5] Ecchymosis [R58] Weakness [R53.1] Prolonged Q-T interval on ECG [R94.31] Hypoxia [R09.02] AKI (acute kidney injury) (HCC) [N17.9] Fall, initial encounter [W19.XXXA] Sepsis [facility]) [A41.9] Patient Active Problem List   Diagnosis Date Noted   Sepsis (HCC) 11/22/2023   Bleeding from the nose 09/25/2023   Fracture of left shoulder 09/12/2023   Whiplash injuries, subsequent encounter 06/24/2023   Thrombocytopenia (HCC) 04/08/2023   Obesity (BMI 30-39.9) 04/08/2023   Myocardial injury 04/08/2023   Acute-on-chronic kidney  injury (HCC) 02/04/2023   History of Bell's palsy with chronic left facial droop 02/03/2023   Anal condyloma 11/26/2022   Diastasis recti 11/26/2022   Hx of adenomatous colonic polyps 11/26/2022   Longstanding persistent atrial fibrillation (HCC) 11/26/2022    Asthmatic bronchitis with acute exacerbation 11/18/2022   Iron  deficiency anemia 08/26/2022   CKD (chronic kidney disease) stage 4, GFR 15-29 ml/min (HCC) 07/07/2022   Chronic anticoagulation 07/07/2022   Complicated UTI (urinary tract infection) 06/24/2022   Ischemic cardiomyopathy 06/22/2022   Permanent atrial fibrillation (HCC) 06/22/2022   Anemia due to stage 4 chronic kidney disease (HCC) 03/04/2022   Internal hemorrhoids 01/21/2022   Arthritis of right acromioclavicular joint 11/12/2021   Chronic atrial fibrillation (HCC)    PAH (pulmonary artery hypertension) (HCC)    Type II diabetes mellitus with renal manifestations (HCC)    Obesity with body mass index of 30.0-39.9    Normocytic anemia    Preventative health care 10/30/2021   Mood disorder (HCC) 10/30/2021   Dupuytren's disease of finger with contracture 05/29/2021   GERD (gastroesophageal reflux disease) 04/26/2021   Long-term use of immunosuppressant medication 05/11/2020   Erectile dysfunction due to arterial insufficiency 10/20/2019   Acute gouty arthropathy 03/30/2019   Pulmonary hypertension due to left heart disease (HCC) 01/20/2019   Superficial thrombophlebitis 12/23/2018   Chronic HFrEF (heart failure with reduced ejection fraction) (HCC) 11/19/2016   Chronic diastolic heart failure (HCC) 11/19/2016   Chronic venous insufficiency 06/05/2016   Proteinuria 06/23/2015   BPH (benign prostatic hyperplasia) 09/25/2014   Immunosuppressed status (HCC) 09/09/2014   H/O gastric bypass 04/25/2014   Benign neoplasm of colon 04/22/2013   Diverticulosis of colon 04/22/2013   Renal transplant recipient, 2012 03/01/2013   Asthma, persistent controlled 02/25/2013   OSA (obstructive sleep apnea) 09/29/2012   Gout 09/13/2012   Immunosuppressive management encounter following kidney transplant 12/25/2010   Essential hypertension 12/17/2010   Hypertension    Hyperlipidemia    CAD s/p CABG 2005 2005   PCP:  [name], MD Pharmacy:   [facility] [address] [place] [address] Phone: [phone] Fax: [phone]  [facility] [address] [place] [address] Phone: [phone] Fax: [phone]     Social Drivers of Health (SDOH) Social History: SDOH Screenings   Food Insecurity: No Food Insecurity (11/22/2023)  Housing: Low Risk  (11/22/2023)  Transportation Needs: No Transportation Needs (11/22/2023)  Utilities: Not At Risk (11/22/2023)  Depression (PHQ2-9): Low Risk  (11/12/2023)  Recent Concern: Depression (PHQ2-9) - High Risk (10/01/2023)  Social Connections: Socially Integrated (04/11/2023)  Tobacco Use: Low Risk  (11/22/2023)  Recent Concern: Tobacco Use - Medium Risk (11/04/2023)   SDOH Interventions:     Readmission Risk Interventions    04/09/2023   12:05 PM 06/24/2022    9:39 AM 11/04/2021   11:44 AM  Readmission Risk Prevention Plan  Transportation Screening Complete Complete Complete  HRI or Home Care Consult  --   Social Work Consult for Recovery Care Planning/Counseling  Complete   Palliative Care Screening  Not Applicable   Medication Review Oceanographer) Complete Complete Complete  PCP or Specialist appointment within 3-5 days of discharge Complete  Complete  HRI or Home Care Consult   Complete  SW Recovery Care/Counseling Consult Complete  Complete  Palliative Care Screening Not Applicable  Not Applicable  Skilled Nursing Facility Not Applicable  Not Applicable

## 2023-11-25 NOTE — Progress Notes (Signed)
 [facility]  ROUNDING NOTE   Subjective:   Patient well-known to us  from prior admissions. He has past medical history of ESRD status post renal transplantation in 2012, chronic systolic heart failure ejection fraction 40 to 45%, atrial fibrillation, coronary disease, obesity, aortic stenosis, history of BPH, chronic venous stasis of both lower extremities, diverticulosis, erectile dysfunction, hyperlipidemia, depression, obstructive sleep apnea, pulmonary artery hypertension, status post gastric bypass, diabetes mellitus type 2 who presented status post fall and generalized weakness.  Patient status post recent hemorrhoid removal as well as rectal biopsy.  He also had anal wart ablation.  Update:    Patient appears in better spirits today 1L [place]  Creatinine 2.41  Objective:  Vital signs in last 24 hours:  Temp:  [97.7 F (36.5 C)-98 F (36.7 C)] 97.7 F (36.5 C) (09/02 1123) Pulse Rate:  [67-80] 80 (09/02 1123) Resp:  [18-20] 18 (09/02 1123) BP: (125-157)/(54-83) 152/77 (09/02 1123) SpO2:  [92 %-97 %] 97 % (09/02 1123)  Weight change:  Filed Weights   11/22/23 1034  Weight: 107.5 kg    Intake/Output: I/O last 3 completed shifts: In: 3070 [P.O.:840; I.V.:1630; IV Piggyback:600] Out: 3100 [Urine:3100]   Intake/Output this shift:  Total I/O In: 240 [P.O.:240] Out: -   Physical Exam: General: No acute distress  Head: Normocephalic, atraumatic. Moist oral mucosal membranes  Lungs:  Clear to auscultation, normal effort  Heart: S1S2 no rubs  Abdomen:  Soft, nontender, bowel sounds present  Extremities: Venous stasis changes noted  Neurologic: Awake, alert, following commands  Skin: No acute rash       Basic Metabolic Panel: Recent Labs  Lab 11/22/23 1140 11/23/23 0453 11/24/23 0405 11/25/23 0509  NA 137 140 140 144  K 4.1 3.8 3.3* 3.2*  CL 97* 100 102 104  CO2 27 28 28 29   GLUCOSE 157* 131* 119* 130*  BUN 91* 95* 90* 82*  CREATININE 3.87* 3.57*  2.87* 2.41*  CALCIUM  8.3* 8.4* 8.4* 8.5*    Liver Function Tests: Recent Labs  Lab 11/22/23 1140 11/23/23 0453  AST 18 14*  ALT 16 14  ALKPHOS 136* 126  BILITOT 0.4 0.8  PROT 6.8 7.0  ALBUMIN  3.5 3.3*   No results for input(s): LIPASE, AMYLASE in the last 168 hours. Recent Labs  Lab 11/22/23 1140  AMMONIA 21    CBC: Recent Labs  Lab 11/22/23 1052 11/23/23 0453 11/23/23 1617 11/24/23 0405 11/25/23 0509  WBC 4.6 3.9*  --  3.3* 3.4*  NEUTROABS 3.7  --   --   --  2.4  HGB 9.9* 10.0* 9.8* 10.0* 10.3*  HCT 33.3* 34.1* 32.7* 32.5* 33.2*  MCV 97.7 96.3  --  93.1 92.7  PLT 93* 92*  --  100* 101*    Cardiac Enzymes: No results for input(s): CKTOTAL, CKMB, CKMBINDEX, TROPONINI in the last 168 hours.  BNP: Invalid input(s): POCBNP  CBG: Recent Labs  Lab 11/24/23 1203 11/24/23 1601 11/24/23 2158 11/25/23 0752 11/25/23 1119  GLUCAP 124* 195* 136* 98 111*    Microbiology: Results for orders placed or performed during the hospital encounter of 11/22/23  Culture, blood (Routine x 2)     Status: None (Preliminary result)   Collection Time: 11/22/23 10:52 AM   Specimen: BLOOD RIGHT ARM  Result Value Ref Range Status   Specimen Description BLOOD RIGHT ARM  Final   Special Requests   Final    BOTTLES DRAWN AEROBIC AND ANAEROBIC Blood Culture adequate volume   Culture  Final    NO GROWTH 3 DAYS Performed at [facility], [address] [place], [place] [address]    Report Status PENDING  Incomplete  Culture, blood (Routine x 2)     Status: None (Preliminary result)   Collection Time: 11/22/23 10:53 AM   Specimen: BLOOD RIGHT HAND  Result Value Ref Range Status   Specimen Description BLOOD RIGHT HAND  Final   Special Requests   Final    BOTTLES DRAWN AEROBIC AND ANAEROBIC Blood Culture adequate volume   Culture   Final    NO GROWTH 3 DAYS Performed at [facility], [address], [place], [place] [address]    Report Status  PENDING  Incomplete  Resp panel by RT-PCR (RSV, Flu A&B, Covid) Anterior Nasal Swab     Status: None   Collection Time: 11/22/23 10:53 AM   Specimen: Anterior Nasal Swab  Result Value Ref Range Status   SARS Coronavirus 2 by RT PCR NEGATIVE NEGATIVE Final    Comment: (NOTE) SARS-CoV-2 target nucleic acids are NOT DETECTED.  The SARS-CoV-2 RNA is generally detectable in upper respiratory specimens during the acute phase of infection. The lowest concentration of SARS-CoV-2 viral copies this assay can detect is 138 copies/mL. A negative result does not preclude SARS-Cov-2 infection and should not be used as the sole basis for treatment or other patient management decisions. A negative result may occur with  improper specimen collection/handling, submission of specimen other than nasopharyngeal swab, presence of viral mutation(s) within the areas targeted by this assay, and inadequate number of viral copies(<138 copies/mL). A negative result must be combined with clinical observations, patient history, and epidemiological information. The expected result is Negative.  Fact Sheet for Patients:  BloggerCourse.com  Fact Sheet for Healthcare Providers:  SeriousBroker.it  This test is no t yet approved or cleared by the [place]  FDA and  has been authorized for detection and/or diagnosis of SARS-CoV-2 by FDA under an Emergency Use Authorization (EUA). This EUA will remain  in effect (meaning this test can be used) for the duration of the COVID-19 declaration under Section 564(b)(1) of the Act, 21 U.S.C.section 360bbb-3(b)(1), unless the authorization is terminated  or revoked sooner.       Influenza A by PCR NEGATIVE NEGATIVE Final   Influenza B by PCR NEGATIVE NEGATIVE Final    Comment: (NOTE) The Xpert Xpress SARS-CoV-2/FLU/RSV plus assay is intended as an aid in the diagnosis of influenza from Nasopharyngeal swab specimens  and should not be used as a sole basis for treatment. Nasal washings and aspirates are unacceptable for Xpert Xpress SARS-CoV-2/FLU/RSV testing.  Fact Sheet for Patients: BloggerCourse.com  Fact Sheet for Healthcare Providers: SeriousBroker.it  This test is not yet approved or cleared by the [place]  FDA and has been authorized for detection and/or diagnosis of SARS-CoV-2 by FDA under an Emergency Use Authorization (EUA). This EUA will remain in effect (meaning this test can be used) for the duration of the COVID-19 declaration under Section 564(b)(1) of the Act, 21 U.S.C. section 360bbb-3(b)(1), unless the authorization is terminated or revoked.     Resp Syncytial Virus by PCR NEGATIVE NEGATIVE Final    Comment: (NOTE) Fact Sheet for Patients: BloggerCourse.com  Fact Sheet for Healthcare Providers: SeriousBroker.it  This test is not yet approved or cleared by the [place]  FDA and has been authorized for detection and/or diagnosis of SARS-CoV-2 by FDA under an Emergency Use Authorization (EUA). This EUA will remain in effect (meaning this test  can be used) for the duration of the COVID-19 declaration under Section 564(b)(1) of the Act, 21 U.S.C. section 360bbb-3(b)(1), unless the authorization is terminated or revoked.  Performed at [facility], [address] [place], [place] [address]   MRSA Next Gen by PCR, Nasal     Status: None   Collection Time: 11/24/23  4:03 AM   Specimen: Nasal Mucosa; Nasal Swab  Result Value Ref Range Status   MRSA by PCR Next Gen NOT DETECTED NOT DETECTED Final    Comment: (NOTE) The GeneXpert MRSA Assay (FDA approved for NASAL specimens only), is one component of a comprehensive MRSA colonization surveillance program. It is not intended to diagnose MRSA infection nor to guide or monitor treatment for MRSA  infections. Test performance is not FDA approved in patients less than 59 years old. Performed at [facility], [address] [place], [place] [address]    *Note: Due to a large number of results and/or encounters for the requested time period, some results have not been displayed. A complete set of results can be found in Results Review.    Coagulation Studies: Recent Labs    11/23/23 0453  LABPROT 21.7*  INR 1.8*    Urinalysis: Recent Labs    11/23/23 0530  COLORURINE YELLOW*  LABSPEC 1.009  PHURINE 5.0  GLUCOSEU 50*  HGBUR LARGE*  BILIRUBINUR NEGATIVE  KETONESUR NEGATIVE  PROTEINUR NEGATIVE  NITRITE NEGATIVE  LEUKOCYTESUR NEGATIVE      Imaging: MR KNEE LEFT WO CONTRAST Result Date: 11/25/2023 CLINICAL DATA:  Knee trauma, internal derangement suspected, xray done EXAM: MR KNEE*L* W/O CM TECHNIQUE: Multiplanar, multisequence MR imaging of the knee was performed. No intravenous contrast was administered. COMPARISON:  Left knee radiographs dated 11/23/2022. FINDINGS: MENISCI Medial: Horizontally oriented irregular hyperintense signal at the body and posterior horn of the medial meniscus is suspicious for a tear. Lateral: Discoid morphology of the lateral meniscus without discrete tear. LIGAMENTS Cruciates: ACL and PCL are intact. Collaterals: Medial collateral ligament is intact. Lateral collateral ligament complex is intact. CARTILAGE Patellofemoral:  No chondral defect. Medial: Mild partial-thickness cartilage loss of the medial femorotibial compartment. Lateral:  No chondral defect. JOINT: Moderate-to-large joint effusion. Edema in Hoffa's fat-pad is likely reactive. POPLITEAL FOSSA: Popliteus tendon is intact. No Baker's cyst. EXTENSOR MECHANISM: Intact quadriceps tendon. Thickening and intermediate signal of the patellar tendon, most compatible with tendinosis. BONES: No aggressive osseous lesion. No fracture or dislocation. Other: Nonspecific subcutaneous edema of  the knee, most pronounced posteriorly. No loculated fluid collection. IMPRESSION: 1. Findings suspicious for horizontal tear of the body and posterior horn of the medial meniscus. 2. Discoid morphology of the lateral meniscus without discrete tear. 3. Moderate to large knee joint effusion. 4. Patellar tendinosis. 5. Mild partial-thickness cartilage loss of the medial femorotibial compartment. 6. Nonspecific subcutaneous edema of the knee, most pronounced posteriorly. Electronically Signed   By: Harrietta Sherry M.D.   On: 11/25/2023 12:25   US  Renal Transplant w/Doppler Result Date: 11/23/2023 EXAM: RETROPERITONEAL ULTRASOUND OF THE KIDNEYS 11/23/2023 03:31:55 PM TECHNIQUE: Real-time ultrasonography of the retroperitoneum, specifically the kidneys and urinary bladder, was performed. COMPARISON: Renal transplant ultrasound 06/21/2022. CLINICAL HISTORY: 409830 AKI (acute kidney injury) (HCC) 409830. AKI (acute kidney injury) (HCC) FINDINGS: TRANSPLANT KIDNEY: The right lower quadrant renal transplant measures 13.3 x 7.0 x 8.0 cm. Estimated volume is 389 ml. Mild hydronephrosis is present. Venous waveform is present in the main renal artery. Resistive index in the main renal artery is 0.88. Resistive  index in the upper pole segmental artery is 0.81. Resistive index in the lower pole segmental artery is 0.71. BLADDER: The urinary bladder is distended. IMPRESSION: 1. Right lower quadrant renal transplant with mild hydronephrosis and borderline elevated resistive index in the main renal artery (0.88). Electronically signed by: Lonni Necessary MD 11/23/2023 06:09 PM EDT RP Workstation: [id number]     Medications:    cefTRIAXone  (ROCEPHIN )  IV 2 g (11/25/23 1013)    apixaban   5 mg Oral BID   buPROPion   150 mg Oral BID   Chlorhexidine  Gluconate Cloth  6 each Topical Daily   colchicine   0.6 mg Oral QODAY   insulin  aspart  0-5 Units Subcutaneous QHS   insulin  aspart  0-6 Units Subcutaneous TID WC   insulin   glargine  14 Units Subcutaneous Daily   montelukast   10 mg Oral QHS   mycophenolate   360 mg Oral q morning   And   mycophenolate   180 mg Oral QPM   predniSONE   5 mg Oral Daily   rosuvastatin   10 mg Oral Daily   tacrolimus   2 mg Oral q morning   And   tacrolimus   1 mg Oral QPM   tamsulosin   0.4 mg Oral Daily   acetaminophen  **OR** acetaminophen , albuterol , morphine  injection, oxyCODONE -acetaminophen , polyethylene glycol  Assessment/ Plan:  63 y.o. male with past medical history of ESRD status post renal transplantation in 2012, chronic systolic heart failure ejection fraction 40 to 45%, atrial fibrillation, coronary disease, obesity, aortic stenosis, history of BPH, chronic venous stasis of both lower extremities, diverticulosis, erectile dysfunction, hyperlipidemia, depression, obstructive sleep apnea, pulmonary artery hypertension, status post gastric bypass, diabetes mellitus type 2 who presented status post fall and generalized weakness.  Patient status post recent hemorrhoid removal as well as rectal biopsy.  He also had anal wart ablation.  1.  Acute kidney injury/chronic kidney disease stage IIIb with history of ESRD status post deceased donor kidney transplant 08/2010 with history of diabetic nephropathy on biopsy in 2017.  This is improved but still above his prior baseline of 2.04. Patient had poor p.o. intake status post surgery.  Suspect acute kidney injury due to volume depletion.  Renal ultrasound performed.  There is mild right-sided hydronephrosis.    Update: Creatinine 2.41, continues to improve.  Encouraged to maintain oral intake. No immediate need for dialysis.  Continue current doses of tacrolimus , prednisone , and mycophenolate .  2.  Anemia of chronic kidney disease.  Hemoglobin  10.3.  No immediate need for Epogen.  Continue to monitor for while admitted.    LOS: 3 [name] 9/2/20251:20 PM

## 2023-11-25 NOTE — Progress Notes (Signed)
 Progress Note   Patient: [name]. FMW:[id number] DOB: Oct 09, 1960 DOA: 11/22/2023     3 DOS: the patient was seen and examined on 11/25/2023    Brief hospital course: From HPI [place]. is a pleasant 63 y.o. male with medical history significant for history of ESRD s/p renal transplant in 2012, currently on immunosuppression therapy, history of heart failure last EF was 40 to 45% in July 2025, on torsemide , A-fib on Eliquis , CAD, obesity on CPAP who presented to ED at [facility] today after a fall at home and generalized weakness.  Per patient's wife patient slid down off his recliner this morning.  He has been weak and has been sleeping out of his recliner multiple times last couple days.  There was no history of fever, nausea, vomiting, chest pain, palpitations, shortness of breath.  He denies any diarrhea, dysuria or urinary symptoms.  Wife noted that he had a biopsy of his rectum 3 days ago and also had a hemorrhoid removal then he has been having some bloody stools.  Patient also said that he had blood in his stool.  He was not able to eat and drink well for the last couple days after he had the surgery.  Patient does not wear oxygen  at baseline.   ED Course: Upon arrival to the ED, patient is found to be hypotensive around 88/48, tachypneic at 24, generally weak, hemoglobin 9.9 and hematocrit 33.3 platelets 93, afebrile at 98.4, creatinine 3.87 and BUN 91, baseline creatinine was around 2.  His troponin was 85.  Patient was initiated for sepsis protocol.  COVID and flu was negative, blood cultures were sent.  Patient was started on vancomycin , cefepime  and metronidazole  for unknown sepsis.  Hospitalist service was consulted for evaluation for admission for sepsis and AKI.       Assessment and Plan:   AKI on CKDIIIb: w/ hx of renal transplant & on immunosuppresants.  Continue on IVFs.  Holding home bactrim .  Continue on home dose of steroids, tacrolimus , mycophenolate .   Antibiotics have been de-escalated from vancomycin  and cefepime  to ceftriaxone  given that culture results shows no growth We will complete a course of 5 days empiric therapy Patient may need trial of voiding closer to discharge Nephrologist on board and case discussed   Sepsis: met criteria w/ tachycardia, tachypnea but unknown source of infection. CXR was neg for pneumonia. UA was positive for Hbg & glucose.  Continue on IV abxs and IVFs. Will complete 5 days course of empiric antibiotics   Left knee pain: fall at home.  MRI showing medial meniscal tear as well as moderate to large joint effusion Continue as needed pain medication I have consulted Dr. Edie and he plans to see patient  Rectal bleeding: w/ recent hx of biopsy of rectum & hemorrhoid removed. Likely etiology of rectal bleeding. Repeat H&H ordered. If continues to bleed/drop in H&H, consider GI consult    Generalized weakness: PT recs SNF. OT consulted    Likely PAF: continue on home dose of eliquis    Chronic systolic CHF: monitor I/Os. Holding home diuretics   OSA: on CPAP   DM2: likely poorly controlled. Continue on glargine, SSI w/ accuchecks    Hx of gout: continue on colchicine     Elevated troponin: likely secondary to demand ischemia    Obesity: BMI 34.0. Would benefit from weight loss     DVT prophylaxis: eliquis  Code Status: full  Family Communication:  Disposition Plan: PT recommends SNF.  Level of care: Telemetry Cardiac   Consultants:  Nephro      Subjective:  Still has left knee pain MRI showing medial meniscal tear I have consulted orthopedics Respiratory function improving   Physical Exam:   General exam: Appears calm and comfortable  Respiratory system: Clear to auscultation. Respiratory effort normal. Cardiovascular system: S1 & S2 +. No rubs, gallops or clicks Gastrointestinal system: Abdomen is obese, soft and nontender. Normal bowel sounds heard. Central nervous system: Alert  and oriented.moves all extremities Psychiatry: Judgement and insight appear normal. Flat mood and affect  Data Reviewed:  MRI of the left knee showing findings suspicious for a horizontal tear of the body and posterior horn of the medial meniscus as well as a discoid morphology of the lateral meniscus without discrete.  Moderate to large joint effusion.   Vitals:   11/24/23 2319 11/25/23 0413 11/25/23 1123 11/25/23 1507  BP: (!) 141/74 (!) 157/82 (!) 152/77 (!) 145/70  Pulse:  69 80 88  Resp: 19 20 18 18   Temp: 97.9 F (36.6 C) 98 F (36.7 C) 97.7 F (36.5 C) 97.8 F (36.6 C)  TempSrc:  Oral    SpO2: 97% 92% 97% 95%  Weight:      Height:          Latest Ref Rng & Units 11/25/2023    5:09 AM 11/24/2023    4:05 AM 11/23/2023    4:17 PM  CBC  WBC 4.0 - 10.5 K/uL 3.4  3.3    Hemoglobin 13.0 - 17.0 g/dL 89.6  89.9  9.8   Hematocrit 39.0 - 52.0 % 33.2  32.5  32.7   Platelets 150 - 400 K/uL 101  100         Latest Ref Rng & Units 11/25/2023    5:09 AM 11/24/2023    4:05 AM 11/23/2023    4:53 AM  BMP  Glucose 70 - 99 mg/dL 869  880  868   BUN 8 - 23 mg/dL 82  90  95   Creatinine 0.61 - 1.24 mg/dL 7.58  7.12  6.42   Sodium 135 - 145 mmol/L 144  140  140   Potassium 3.5 - 5.1 mmol/L 3.2  3.3  3.8   Chloride 98 - 111 mmol/L 104  102  100   CO2 22 - 32 mmol/L 29  28  28    Calcium  8.9 - 10.3 mg/dL 8.5  8.4  8.4      Author: Drue ONEIDA Potter, MD 11/25/2023 4:47 PM  For on call review [email or web address].

## 2023-11-25 NOTE — NC FL2 (Signed)
 [place]  MEDICAID [id number] LEVEL OF CARE FORM     IDENTIFICATION  Patient Name: [name]. Birthdate: [birth year] Sex: male Admission Date (Current Location): 11/22/2023  [place] and [place] Number:  Chiropodist and Address:  [name], [address] [place], [place] [address]      Provider Number: [id number]  Attending Physician Name and Address:  Dorinda Drue DASEN, MD  Relative Name and Phone Number:       Current Level of Care: Hospital Recommended Level of Care: Skilled Nursing Facility Prior Approval Number:    Date Approved/Denied:   PASRR Number: Manual review  Discharge Plan: SNF    Current Diagnoses: Patient Active Problem List   Diagnosis Date Noted   Sepsis (HCC) 11/22/2023   Bleeding from the nose 09/25/2023   Fracture of left shoulder 09/12/2023   Whiplash injuries, subsequent encounter 06/24/2023   Thrombocytopenia (HCC) 04/08/2023   Obesity (BMI 30-39.9) 04/08/2023   Myocardial injury 04/08/2023   Acute-on-chronic kidney injury (HCC) 02/04/2023   History of Bell's palsy with chronic left facial droop 02/03/2023   Anal condyloma 11/26/2022   Diastasis recti 11/26/2022   Hx of adenomatous colonic polyps 11/26/2022   Longstanding persistent atrial fibrillation (HCC) 11/26/2022   Asthmatic bronchitis with acute exacerbation 11/18/2022   Iron  deficiency anemia 08/26/2022   CKD (chronic kidney disease) stage 4, GFR 15-29 ml/min (HCC) 07/07/2022   Chronic anticoagulation 07/07/2022   Complicated UTI (urinary tract infection) 06/24/2022   Ischemic cardiomyopathy 06/22/2022   Permanent atrial fibrillation (HCC) 06/22/2022   Anemia due to stage 4 chronic kidney disease (HCC) 03/04/2022   Internal hemorrhoids 01/21/2022   Arthritis of right acromioclavicular joint 11/12/2021   Chronic atrial fibrillation (HCC)    PAH (pulmonary artery hypertension) (HCC)    Type II diabetes mellitus with renal manifestations  (HCC)    Obesity with body mass index of 30.0-39.9    Normocytic anemia    Preventative health care 10/30/2021   Mood disorder (HCC) 10/30/2021   Dupuytren's disease of finger with contracture 05/29/2021   GERD (gastroesophageal reflux disease) 04/26/2021   Long-term use of immunosuppressant medication 05/11/2020   Erectile dysfunction due to arterial insufficiency 10/20/2019   Acute gouty arthropathy 03/30/2019   Pulmonary hypertension due to left heart disease (HCC) 01/20/2019   Superficial thrombophlebitis 12/23/2018   Chronic HFrEF (heart failure with reduced ejection fraction) (HCC) 11/19/2016   Chronic diastolic heart failure (HCC) 11/19/2016   Chronic venous insufficiency 06/05/2016   Proteinuria 06/23/2015   BPH (benign prostatic hyperplasia) 09/25/2014   Immunosuppressed status (HCC) 09/09/2014   H/O gastric bypass 04/25/2014   Benign neoplasm of colon 04/22/2013   Diverticulosis of colon 04/22/2013   Renal transplant recipient, 2012 03/01/2013   Asthma, persistent controlled 02/25/2013   OSA (obstructive sleep apnea) 09/29/2012   Gout 09/13/2012   Immunosuppressive management encounter following kidney transplant 12/25/2010   Essential hypertension 12/17/2010   Hypertension    Hyperlipidemia    CAD s/p CABG 2005 2005    Orientation RESPIRATION BLADDER Height & Weight     Self, Time, Situation, Place  O2, Other (Comment) (Nasal Cannula 1 L. CPAP at bedtime. Has home equipment at the hospital.) Continent, Indwelling catheter Weight: 236 lb 15.9 oz (107.5 kg) Height:  5' 10 (177.8 cm)  BEHAVIORAL SYMPTOMS/MOOD NEUROLOGICAL BOWEL NUTRITION STATUS   (None)  (None) Incontinent Diet (Regular)  AMBULATORY STATUS COMMUNICATION OF NEEDS Skin   Extensive Assist Verbally Skin abrasions, Other (Comment) (Erythema/redness.)  Personal Care Assistance Level of Assistance  Bathing, Feeding, Dressing Bathing Assistance: Maximum assistance Feeding  assistance: Limited assistance Dressing Assistance: Maximum assistance     Functional Limitations Info  Sight, Hearing, Speech Sight Info: Adequate Hearing Info: Adequate Speech Info: Adequate    SPECIAL CARE FACTORS FREQUENCY  PT (By licensed PT), OT (By licensed OT)     PT Frequency: 5 x week OT Frequency: 5 x week            Contractures Contractures Info: Not present    Additional Factors Info  Code Status, Allergies Code Status Info: Full code Allergies Info: Iodinated Contrast Media, Iodine, Nsaids, Ibuprofen           Current Medications (11/25/2023):  This is the current hospital active medication list Current Facility-Administered Medications  Medication Dose Route Frequency Provider Last Rate Last Admin   acetaminophen  (TYLENOL ) tablet 650 mg  650 mg Oral Q6H PRN Paudel, Keshab, MD   650 mg at 11/23/23 9087   Or   acetaminophen  (TYLENOL ) suppository 650 mg  650 mg Rectal Q6H PRN Roann Gouty, MD       albuterol  (PROVENTIL ) (2.5 MG/3ML) 0.083% nebulizer solution 2.5 mg  2.5 mg Nebulization Q6H PRN Paudel, Gouty, MD       apixaban  (ELIQUIS ) tablet 5 mg  5 mg Oral BID Paudel, Keshab, MD   5 mg at 11/25/23 9077   buPROPion  (WELLBUTRIN  SR) 12 hr tablet 150 mg  150 mg Oral BID Paudel, Gouty, MD   150 mg at 11/25/23 9077   cefTRIAXone  (ROCEPHIN ) 2 g in sodium chloride  0.9 % 100 mL IVPB  2 g Intravenous Q24H Dorinda Homans T, MD 200 mL/hr at 11/25/23 1013 2 g at 11/25/23 1013   Chlorhexidine  Gluconate Cloth 2 % PADS 6 each  6 each Topical Daily Trudy Anthony HERO, MD   6 each at 11/25/23 9070   colchicine  tablet 0.6 mg  0.6 mg Oral QODAY Paudel, Keshab, MD   0.6 mg at 11/25/23 9076   insulin  aspart (novoLOG ) injection 0-5 Units  0-5 Units Subcutaneous QHS Paudel, Gouty, MD       insulin  aspart (novoLOG ) injection 0-6 Units  0-6 Units Subcutaneous TID WC Paudel, Keshab, MD   1 Units at 11/24/23 1729   insulin  glargine (LANTUS ) injection 14 Units  14 Units Subcutaneous  Daily Paudel, Keshab, MD   14 Units at 11/24/23 2130   montelukast  (SINGULAIR ) tablet 10 mg  10 mg Oral QHS Paudel, Gouty, MD   10 mg at 11/24/23 2130   morphine  (PF) 2 MG/ML injection 1 mg  1 mg Intravenous Q4H PRN Williams, Jamiese M, MD   1 mg at 11/25/23 0344   mycophenolate  (MYFORTIC ) EC tablet 360 mg  360 mg Oral q morning Trudy Anthony HERO, MD   360 mg at 11/25/23 9077   And   mycophenolate  (MYFORTIC ) EC tablet 180 mg  180 mg Oral QPM Trudy Anthony HERO, MD   180 mg at 11/24/23 1729   oxyCODONE -acetaminophen  (PERCOCET/ROXICET) 5-325 MG per tablet 1 tablet  1 tablet Oral Q6H PRN Trudy Anthony HERO, MD   1 tablet at 11/25/23 0925   polyethylene glycol (MIRALAX  / GLYCOLAX ) packet 17 g  17 g Oral Daily PRN Paudel, Keshab, MD       predniSONE  (DELTASONE ) tablet 5 mg  5 mg Oral Daily Paudel, Gouty, MD   5 mg at 11/25/23 9076   rosuvastatin  (CRESTOR ) tablet 10 mg  10 mg Oral Daily Paudel, Keshab, MD  10 mg at 11/25/23 9075   tacrolimus  (PROGRAF ) capsule 2 mg  2 mg Oral q morning Paudel, Keshab, MD   2 mg at 11/25/23 9075   And   tacrolimus  (PROGRAF ) capsule 1 mg  1 mg Oral QPM Paudel, Keshab, MD   1 mg at 11/24/23 1729   tamsulosin  (FLOMAX ) capsule 0.4 mg  0.4 mg Oral Daily Trudy Anthony HERO, MD   0.4 mg at 11/25/23 9075     Discharge Medications: Please see discharge summary for a list of discharge medications.  Relevant Imaging Results:  Relevant Lab Results:   Additional Information SS#: [id number]  Lauraine JAYSON Carpen, LCSW

## 2023-11-26 ENCOUNTER — Other Ambulatory Visit: Payer: Self-pay

## 2023-11-26 DIAGNOSIS — I5022 Chronic systolic (congestive) heart failure: Secondary | ICD-10-CM

## 2023-11-26 DIAGNOSIS — Z940 Kidney transplant status: Secondary | ICD-10-CM | POA: Diagnosis not present

## 2023-11-26 LAB — CBC WITH DIFFERENTIAL/PLATELET
Abs Immature Granulocytes: 0.03 K/uL (ref 0.00–0.07)
Basophils Absolute: 0 K/uL (ref 0.0–0.1)
Basophils Relative: 0 %
Eosinophils Absolute: 0 K/uL (ref 0.0–0.5)
Eosinophils Relative: 0 %
HCT: 34 % — ABNORMAL LOW (ref 39.0–52.0)
Hemoglobin: 10.4 g/dL — ABNORMAL LOW (ref 13.0–17.0)
Immature Granulocytes: 1 %
Lymphocytes Relative: 8 %
Lymphs Abs: 0.3 K/uL — ABNORMAL LOW (ref 0.7–4.0)
MCH: 28.5 pg (ref 26.0–34.0)
MCHC: 30.6 g/dL (ref 30.0–36.0)
MCV: 93.2 fL (ref 80.0–100.0)
Monocytes Absolute: 0.1 K/uL (ref 0.1–1.0)
Monocytes Relative: 4 %
Neutro Abs: 2.8 K/uL (ref 1.7–7.7)
Neutrophils Relative %: 87 %
Platelets: 118 K/uL — ABNORMAL LOW (ref 150–400)
RBC: 3.65 MIL/uL — ABNORMAL LOW (ref 4.22–5.81)
RDW: 14.9 % (ref 11.5–15.5)
WBC: 3.3 K/uL — ABNORMAL LOW (ref 4.0–10.5)
nRBC: 0 % (ref 0.0–0.2)

## 2023-11-26 LAB — BASIC METABOLIC PANEL WITH GFR
Anion gap: 11 (ref 5–15)
BUN: 76 mg/dL — ABNORMAL HIGH (ref 8–23)
CO2: 28 mmol/L (ref 22–32)
Calcium: 8.8 mg/dL — ABNORMAL LOW (ref 8.9–10.3)
Chloride: 105 mmol/L (ref 98–111)
Creatinine, Ser: 2 mg/dL — ABNORMAL HIGH (ref 0.61–1.24)
GFR, Estimated: 37 mL/min — ABNORMAL LOW (ref >60)
Glucose, Bld: 150 mg/dL — ABNORMAL HIGH (ref 70–99)
Potassium: 4 mmol/L (ref 3.5–5.1)
Sodium: 144 mmol/L (ref 135–145)

## 2023-11-26 LAB — GLUCOSE, CAPILLARY
Glucose-Capillary: 177 mg/dL — ABNORMAL HIGH (ref 70–99)
Glucose-Capillary: 213 mg/dL — ABNORMAL HIGH (ref 70–99)
Glucose-Capillary: 230 mg/dL — ABNORMAL HIGH (ref 70–99)
Glucose-Capillary: 227 mg/dL — ABNORMAL HIGH (ref 70–99)

## 2023-11-26 NOTE — Progress Notes (Signed)
 [facility]  ROUNDING NOTE   Subjective:   Patient well-known to us  from prior admissions. He has past medical history of ESRD status post renal transplantation in 2012, chronic systolic heart failure ejection fraction 40 to 45%, atrial fibrillation, coronary disease, obesity, aortic stenosis, history of BPH, chronic venous stasis of both lower extremities, diverticulosis, erectile dysfunction, hyperlipidemia, depression, obstructive sleep apnea, pulmonary artery hypertension, status post gastric bypass, diabetes mellitus type 2 who presented status post fall and generalized weakness.  Patient status post recent hemorrhoid removal as well as rectal biopsy.  He also had anal wart ablation.  Update:    Patient sitting up in chair Room air No edema   Creatinine 2.00 UOP  Objective:  Vital signs in last 24 hours:  Temp:  [97.4 F (36.3 C)-97.9 F (36.6 C)] 97.8 F (36.6 C) (09/03 1059) Pulse Rate:  [73-88] 73 (09/03 1059) Resp:  [17-20] 17 (09/03 1059) BP: (142-160)/(70-89) 142/88 (09/03 1059) SpO2:  [95 %-99 %] 99 % (09/03 1059)  Weight change:  Filed Weights   11/22/23 1034  Weight: 107.5 kg    Intake/Output: I/O last 3 completed shifts: In: 4996.7 [P.O.:1440; IV Piggyback:3556.7] Out: 4650 [Urine:4650]   Intake/Output this shift:  Total I/O In: 240 [P.O.:240] Out: 450 [Urine:450]  Physical Exam: General: No acute distress  Head: Normocephalic, atraumatic. Moist oral mucosal membranes  Lungs:  Clear to auscultation, normal effort  Heart: S1S2 no rubs  Abdomen:  Soft, nontender, bowel sounds present  Extremities: Venous stasis changes noted  Neurologic: Awake, alert, following commands  Skin: No acute rash       Basic Metabolic Panel: Recent Labs  Lab 11/22/23 1140 11/23/23 0453 11/24/23 0405 11/25/23 0509 11/26/23 0446  NA 137 140 140 144 144  K 4.1 3.8 3.3* 3.2* 4.0  CL 97* 100 102 104 105  CO2 27 28 28 29 28   GLUCOSE 157* 131*  119* 130* 150*  BUN 91* 95* 90* 82* 76*  CREATININE 3.87* 3.57* 2.87* 2.41* 2.00*  CALCIUM  8.3* 8.4* 8.4* 8.5* 8.8*    Liver Function Tests: Recent Labs  Lab 11/22/23 1140 11/23/23 0453  AST 18 14*  ALT 16 14  ALKPHOS 136* 126  BILITOT 0.4 0.8  PROT 6.8 7.0  ALBUMIN  3.5 3.3*   No results for input(s): LIPASE, AMYLASE in the last 168 hours. Recent Labs  Lab 11/22/23 1140  AMMONIA 21    CBC: Recent Labs  Lab 11/22/23 1052 11/23/23 0453 11/23/23 1617 11/24/23 0405 11/25/23 0509 11/26/23 0446  WBC 4.6 3.9*  --  3.3* 3.4* 3.3*  NEUTROABS 3.7  --   --   --  2.4 2.8  HGB 9.9* 10.0* 9.8* 10.0* 10.3* 10.4*  HCT 33.3* 34.1* 32.7* 32.5* 33.2* 34.0*  MCV 97.7 96.3  --  93.1 92.7 93.2  PLT 93* 92*  --  100* 101* 118*    Cardiac Enzymes: No results for input(s): CKTOTAL, CKMB, CKMBINDEX, TROPONINI in the last 168 hours.  BNP: Invalid input(s): POCBNP  CBG: Recent Labs  Lab 11/25/23 1119 11/25/23 1659 11/25/23 2152 11/26/23 0807 11/26/23 1213  GLUCAP 111* 170* 137* 177* 213*    Microbiology: Results for orders placed or performed during the hospital encounter of 11/22/23  Culture, blood (Routine x 2)     Status: None (Preliminary result)   Collection Time: 11/22/23 10:52 AM   Specimen: BLOOD RIGHT ARM  Result Value Ref Range Status   Specimen Description BLOOD RIGHT ARM  Final   Special  Requests   Final    BOTTLES DRAWN AEROBIC AND ANAEROBIC Blood Culture adequate volume   Culture   Final    NO GROWTH 3 DAYS Performed at [facility] [place], [address] [place], [place] [address]    Report Status PENDING  Incomplete  Culture, blood (Routine x 2)     Status: None (Preliminary result)   Collection Time: 11/22/23 10:53 AM   Specimen: BLOOD RIGHT HAND  Result Value Ref Range Status   Specimen Description BLOOD RIGHT HAND  Final   Special Requests   Final    BOTTLES DRAWN AEROBIC AND ANAEROBIC Blood Culture adequate volume   Culture    Final    NO GROWTH 3 DAYS Performed at [facility], [address] [place], [place] [address]    Report Status PENDING  Incomplete  Resp panel by RT-PCR (RSV, Flu A&B, Covid) Anterior Nasal Swab     Status: None   Collection Time: 11/22/23 10:53 AM   Specimen: Anterior Nasal Swab  Result Value Ref Range Status   SARS Coronavirus 2 by RT PCR NEGATIVE NEGATIVE Final    Comment: (NOTE) SARS-CoV-2 target nucleic acids are NOT DETECTED.  The SARS-CoV-2 RNA is generally detectable in upper respiratory specimens during the acute phase of infection. The lowest concentration of SARS-CoV-2 viral copies this assay can detect is 138 copies/mL. A negative result does not preclude SARS-Cov-2 infection and should not be used as the sole basis for treatment or other patient management decisions. A negative result may occur with  improper specimen collection/handling, submission of specimen other than nasopharyngeal swab, presence of viral mutation(s) within the areas targeted by this assay, and inadequate number of viral copies(<138 copies/mL). A negative result must be combined with clinical observations, patient history, and epidemiological information. The expected result is Negative.  Fact Sheet for Patients:  BloggerCourse.com  Fact Sheet for Healthcare Providers:  SeriousBroker.it  This test is no t yet approved or cleared by the [place]  FDA and  has been authorized for detection and/or diagnosis of SARS-CoV-2 by FDA under an Emergency Use Authorization (EUA). This EUA will remain  in effect (meaning this test can be used) for the duration of the COVID-19 declaration under Section 564(b)(1) of the Act, 21 U.S.C.section 360bbb-3(b)(1), unless the authorization is terminated  or revoked sooner.       Influenza A by PCR NEGATIVE NEGATIVE Final   Influenza B by PCR NEGATIVE NEGATIVE Final    Comment: (NOTE) The Xpert  Xpress SARS-CoV-2/FLU/RSV plus assay is intended as an aid in the diagnosis of influenza from Nasopharyngeal swab specimens and should not be used as a sole basis for treatment. Nasal washings and aspirates are unacceptable for Xpert Xpress SARS-CoV-2/FLU/RSV testing.  Fact Sheet for Patients: BloggerCourse.com  Fact Sheet for Healthcare Providers: SeriousBroker.it  This test is not yet approved or cleared by the [place]  FDA and has been authorized for detection and/or diagnosis of SARS-CoV-2 by FDA under an Emergency Use Authorization (EUA). This EUA will remain in effect (meaning this test can be used) for the duration of the COVID-19 declaration under Section 564(b)(1) of the Act, 21 U.S.C. section 360bbb-3(b)(1), unless the authorization is terminated or revoked.     Resp Syncytial Virus by PCR NEGATIVE NEGATIVE Final    Comment: (NOTE) Fact Sheet for Patients: BloggerCourse.com  Fact Sheet for Healthcare Providers: SeriousBroker.it  This test is not yet approved or cleared by the [place]  FDA and has been authorized for detection  and/or diagnosis of SARS-CoV-2 by FDA under an Emergency Use Authorization (EUA). This EUA will remain in effect (meaning this test can be used) for the duration of the COVID-19 declaration under Section 564(b)(1) of the Act, 21 U.S.C. section 360bbb-3(b)(1), unless the authorization is terminated or revoked.  Performed at [facility], [address] [place], [place] [address]   MRSA Next Gen by PCR, Nasal     Status: None   Collection Time: 11/24/23  4:03 AM   Specimen: Nasal Mucosa; Nasal Swab  Result Value Ref Range Status   MRSA by PCR Next Gen NOT DETECTED NOT DETECTED Final    Comment: (NOTE) The GeneXpert MRSA Assay (FDA approved for NASAL specimens only), is one component of a comprehensive MRSA colonization  surveillance program. It is not intended to diagnose MRSA infection nor to guide or monitor treatment for MRSA infections. Test performance is not FDA approved in patients less than 18 years old. Performed at [facility], [address] [place], [place] [address]   Body fluid culture w Gram Stain     Status: None (Preliminary result)   Collection Time: 11/25/23  7:31 PM   Specimen: Synovium; Synovial Fluid  Result Value Ref Range Status   Specimen Description   Final    SYNOVIAL Performed at [facility], [address] [place], [place] [address]    Special Requests   Final    JOINT KNEE Performed at [facility], [address] [place], [place] [address]    Gram Stain NO WBC SEEN NO ORGANISMS SEEN   Final   Culture   Final    NO GROWTH < 12 HOURS Performed at [facility], [address] [place], [place] [address]    Report Status PENDING  Incomplete   *Note: Due to a large number of results and/or encounters for the requested time period, some results have not been displayed. A complete set of results can be found in Results Review.    Coagulation Studies: No results for input(s): LABPROT, INR in the last 72 hours.   Urinalysis: No results for input(s): COLORURINE, LABSPEC, PHURINE, GLUCOSEU, HGBUR, BILIRUBINUR, KETONESUR, PROTEINUR, UROBILINOGEN, NITRITE, LEUKOCYTESUR in the last 72 hours.  Invalid input(s): APPERANCEUR     Imaging: MR KNEE LEFT WO CONTRAST Result Date: 11/25/2023 CLINICAL DATA:  Knee trauma, internal derangement suspected, xray done EXAM: MR KNEE*L* W/O CM TECHNIQUE: Multiplanar, multisequence MR imaging of the knee was performed. No intravenous contrast was administered. COMPARISON:  Left knee radiographs dated 11/23/2022. FINDINGS: MENISCI Medial: Horizontally oriented irregular hyperintense signal at the body and posterior horn of the medial meniscus is suspicious for a tear.  Lateral: Discoid morphology of the lateral meniscus without discrete tear. LIGAMENTS Cruciates: ACL and PCL are intact. Collaterals: Medial collateral ligament is intact. Lateral collateral ligament complex is intact. CARTILAGE Patellofemoral:  No chondral defect. Medial: Mild partial-thickness cartilage loss of the medial femorotibial compartment. Lateral:  No chondral defect. JOINT: Moderate-to-large joint effusion. Edema in Hoffa's fat-pad is likely reactive. POPLITEAL FOSSA: Popliteus tendon is intact. No Baker's cyst. EXTENSOR MECHANISM: Intact quadriceps tendon. Thickening and intermediate signal of the patellar tendon, most compatible with tendinosis. BONES: No aggressive osseous lesion. No fracture or dislocation. Other: Nonspecific subcutaneous edema of the knee, most pronounced posteriorly. No loculated fluid collection. IMPRESSION: 1. Findings suspicious for horizontal tear of the body and posterior horn of the medial meniscus. 2. Discoid morphology of the lateral meniscus without discrete tear. 3. Moderate to  large knee joint effusion. 4. Patellar tendinosis. 5. Mild partial-thickness cartilage loss of the medial femorotibial compartment. 6. Nonspecific subcutaneous edema of the knee, most pronounced posteriorly. Electronically Signed   By: Harrietta Sherry M.D.   On: 11/25/2023 12:25     Medications:      apixaban   5 mg Oral BID   bupivacaine (PF)  10 mL Intra-articular Once   buPROPion   150 mg Oral BID   Chlorhexidine  Gluconate Cloth  6 each Topical Daily   colchicine   0.6 mg Oral QODAY   insulin  aspart  0-5 Units Subcutaneous QHS   insulin  aspart  0-6 Units Subcutaneous TID WC   insulin  glargine  14 Units Subcutaneous Daily   montelukast   10 mg Oral QHS   mycophenolate   360 mg Oral q morning   And   mycophenolate   180 mg Oral QPM   predniSONE   5 mg Oral Daily   rosuvastatin   10 mg Oral Daily   tacrolimus   2 mg Oral q morning   And   tacrolimus   1 mg Oral QPM   tamsulosin   0.4  mg Oral Daily   triamcinolone  acetonide  80 mg Intra-articular Once   acetaminophen  **OR** acetaminophen , albuterol , morphine  injection, oxyCODONE -acetaminophen , polyethylene glycol  Assessment/ Plan:  63 y.o. male with past medical history of ESRD status post renal transplantation in 2012, chronic systolic heart failure ejection fraction 40 to 45%, atrial fibrillation, coronary disease, obesity, aortic stenosis, history of BPH, chronic venous stasis of both lower extremities, diverticulosis, erectile dysfunction, hyperlipidemia, depression, obstructive sleep apnea, pulmonary artery hypertension, status post gastric bypass, diabetes mellitus type 2 who presented status post fall and generalized weakness.  Patient status post recent hemorrhoid removal as well as rectal biopsy.  He also had anal wart ablation.  1.  Acute kidney injury/chronic kidney disease stage IIIb with history of ESRD status post deceased donor kidney transplant 08/2010 with history of diabetic nephropathy on biopsy in 2017.  This is improved but still above his prior baseline of 2.04. Patient had poor p.o. intake status post surgery.  Suspect acute kidney injury due to volume depletion.  Renal ultrasound performed.  There is mild right-sided hydronephrosis.    Update: Creatinine 2.00, baseline.  Encouraged to maintain oral intake. Continue current doses of tacrolimus , prednisone , and mycophenolate .Will continue to monitor peripherally.   2.  Anemia of chronic kidney disease.  Hemoglobin  10.4.  Continue to monitor for while admitted.    LOS: 4 Zauria Dombek 9/3/202512:46 PM

## 2023-11-26 NOTE — Progress Notes (Signed)
 Patient ID: Carl Moscato., male   DOB: Sep 20, 1960, 63 y.o.   MRN: [id number]  Subjective: Overall, the patient feels that his knee symptoms are substantially improved following the steroid injection administered last evening.  He notes that he was able to ambulate in the halls and has already made 6 circuits around the nurses station with plans to do another 3 laps before bedtime.  He has no new complaints pertaining to his left knee.  Objective: Vital signs in last 24 hours: Temp:  [97.4 F (36.3 C)-97.9 F (36.6 C)] 97.6 F (36.4 C) (09/03 1619) Pulse Rate:  [73-82] 76 (09/03 1619) Resp:  [16-20] 16 (09/03 1619) BP: (139-160)/(76-89) 139/80 (09/03 1619) SpO2:  [95 %-99 %] 95 % (09/03 1619)  Intake/Output from previous day: 09/02 0701 - 09/03 0700 In: 4756.7 [P.O.:1200; IV Piggyback:3556.7] Out: 2950 [Urine:2950] Intake/Output this shift: Total I/O In: 420 [P.O.:420] Out: 450 [Urine:450]  Recent Labs    11/24/23 0405 11/25/23 0509 11/26/23 0446  HGB 10.0* 10.3* 10.4*   Recent Labs    11/25/23 0509 11/26/23 0446  WBC 3.4* 3.3*  RBC 3.58* 3.65*  HCT 33.2* 34.0*  PLT 101* 118*   Recent Labs    11/25/23 0509 11/26/23 0446  NA 144 144  K 3.2* 4.0  CL 104 105  CO2 29 28  BUN 82* 76*  CREATININE 2.41* 2.00*  GLUCOSE 130* 150*  CALCIUM  8.5* 8.8*   No results for input(s): LABPT, INR in the last 72 hours.  Physical Exam: Orthopedic examination is limited to the left knee and lower extremity.  The swelling and tenderness noted yesterday is markedly improved today.  Actively Is able to extend his knee to within 10 degrees of full extension and flex to 100 degrees without pain.  His neurovascular status is unchanged as compared to yesterday.  Assessment: Acute left knee hemarthrosis with medial meniscus tear, symptomatically improved.  Plan: The treatment options have been reviewed with the patient.  He may continue to be mobilized as symptoms permit,  weightbearing as tolerated on the left lower extremity.  Thank you for asking me to participate in the care of this most pleasant patient.  I will sign off at this time.  If you have further need of orthopedic input during this hospitalization, please reconsult me.  He may follow-up in our office on an as necessary basis.   [name] 11/26/2023, 5:04 PM

## 2023-11-26 NOTE — Progress Notes (Addendum)
 Progress Note   Patient: [name]. FMW:[id number] DOB: 05/14/1960 DOA: 11/22/2023     4 DOS: the patient was seen and examined on 11/26/2023   Brief hospital course: From HPI [name]. is a pleasant 63 y.o. male with medical history significant for history of ESRD s/p renal transplant in 2012, currently on immunosuppression therapy, history of heart failure last EF was 40 to 45% in July 2025, on torsemide , A-fib on Eliquis , CAD, obesity on CPAP who presented to ED at [facility] today after a fall at home and generalized weakness.  Per patient's wife patient slid down off his recliner this morning.  He has been weak and has been sleeping out of his recliner multiple times last couple days.   Upon arrival to the ED, patient is found to be hypotensive around 88/48.  His creatinine went up to 3.87 with baseline around 2.   Active Problems:   CAD s/p CABG 2005   Chronic HFrEF (heart failure with reduced ejection fraction) (HCC)   Hypertension   Hyperlipidemia   Renal transplant recipient, 2012   Ischemic cardiomyopathy   OSA (obstructive sleep apnea)   H/O gastric bypass   Type II diabetes mellitus with renal manifestations (HCC)   Immunosuppressed status (HCC)   Sepsis (HCC)   Assessment and Plan: AKI on CKDIIIb: w/ hx of renal transplant & on immunosuppresants.  Received IV fluids and renal function has back to baseline. Holding home bactrim .  Continue on home dose of steroids, tacrolimus , mycophenolate .     Sepsis with immunosuppression: met criteria w/ tachycardia, tachypnea and leukopenia but unknown source of infection. CXR was neg for pneumonia. UA was positive for Hbg & glucose.  Patient so far has completed 5 days antibiotics.   Left knee pain: medial meniscal tear  fall at home.  Impaired mobility from knee pain MRI showing medial meniscal tear as well as moderate to large joint effusion Patient is seen by orthopedics, had joint injection.  Pain much  better. Patient also walked better today after joint injection, no longer want nursing home.  Will discharge home when medically stable.  Urinary retention secondary to benign prostate hypertrophy. Gross hematuria secondary to Foley catheter placement Patient had a Foley catheter placed at time of admission, Flomax  started.  Patient still has some gross hematuria from the initial trauma from Foley catheter.  Will continue to follow.   Rectal bleeding: w/ recent hx of biopsy of rectum & hemorrhoid removed. Likely etiology of rectal bleeding.  Rectal bleeding has resolved.   PAF:  Hold off Eliquis  due to gross hematuria.  Chronic systolic CHF: Currently euvolemic, hold off diuretics for now.   OSA: on CPAP   DM2: likely poorly controlled. Continue on glargine, SSI w/ accuchecks    Hx of gout: continue on colchicine       Class II obesity: BMI 34.0. Would benefit from weight loss         Subjective:  Has some gross hematuria, otherwise no complaint.  Physical Exam: Vitals:   11/26/23 0009 11/26/23 0354 11/26/23 0739 11/26/23 1059  BP:  (!) 155/85 (!) 149/89 (!) 142/88  Pulse:  75 77 73  Resp: 17 18  17   Temp:  97.7 F (36.5 C) 97.6 F (36.4 C) 97.8 F (36.6 C)  TempSrc:      SpO2:  97% 95% 99%  Weight:      Height:       General exam: Appears calm and comfortable  Respiratory system:  Clear to auscultation. Respiratory effort normal. Cardiovascular system: S1 & S2 heard, RRR. No JVD, murmurs, rubs, gallops or clicks. No pedal edema. Gastrointestinal system: Abdomen is nondistended, soft and nontender. No organomegaly or masses felt. Normal bowel sounds heard. Central nervous system: Alert and oriented. No focal neurological deficits. Extremities: Symmetric 5 x 5 power. Skin: No rashes, lesions or ulcers Psychiatry: Judgement and insight appear normal. Mood & affect appropriate.    Data Reviewed:  Lab results reviewed.  Family Communication:  None  Disposition: Status is: Inpatient Remains inpatient appropriate because: Severity of disease.     Time spent: 35 minutes  Author: Murvin Mana, MD 11/26/2023 2:12 PM  For on call review [email or web address].  '

## 2023-11-26 NOTE — Hospital Course (Signed)
 From HPI [name]. is a pleasant 63 y.o. male with medical history significant for history of ESRD s/p renal transplant in 2012, currently on immunosuppression therapy, history of heart failure last EF was 40 to 45% in July 2025, on torsemide , A-fib on Eliquis , CAD, obesity on CPAP who presented to ED at [facility] today after a fall at home and generalized weakness.  Per patient's wife patient slid down off his recliner this morning.  He has been weak and has been sleeping out of his recliner multiple times last couple days.   Upon arrival to the ED, patient is found to be hypotensive around 88/48.  His creatinine went up to 3.87 with baseline around 2.

## 2023-11-26 NOTE — Progress Notes (Signed)
 Occupational Therapy Treatment Patient Details Name: [name]. MRN: [id number] DOB: 1960/04/03 Today's Date: 11/26/2023   History of present illness [name]. is a pleasant 63 y.o. male with medical history significant for history of ESRD s/p renal transplant in 2012, currently on immunosuppression therapy, history of heart failure last EF was 40 to 45% in July 2025, on torsemide , A-fib on Eliquis , CAD, obesity on CPAP who presented to ED at [facility] today after a fall at home and generalized weakness.   OT comments  Upon entering the room, pt supine in bed and agreeable to OT intervention. Pt reports feeling much better today and has a big smile on his face. Pt standing with min guard and ambulates 300' with RW with supervision and no LOB. Pt returning to room for BM and able to perform hygiene while seated without assistance min guard to stand from commode. Pt washing hands while standing and sink and then ambulation back to sit in recliner chair at end of session. Pt making excellent progress this session. Call bell and all needed items within reach.       If plan is discharge home, recommend the following:  Assist for transportation;Help with stairs or ramp for entrance;A little help with walking and/or transfers;A little help with bathing/dressing/bathroom;Assistance with cooking/housework   Equipment Recommendations  BSC/3in1;Other (comment) (2WW)       Precautions / Restrictions Precautions Precautions: Fall Precaution/Restrictions Comments: old left shoulder fx early June       Mobility Bed Mobility               General bed mobility comments: seated in recliner chair at beginning and end of session    Transfers Overall transfer level: Needs assistance Equipment used: Rolling walker (2 wheels) Transfers: Sit to/from Stand Sit to Stand: Contact guard assist                 Balance   Sitting-balance support: No upper extremity supported,  Feet supported Sitting balance-Leahy Scale: Normal     Standing balance support: During functional activity, No upper extremity supported Standing balance-Leahy Scale: Fair                             ADL either performed or assessed with clinical judgement   ADL Overall ADL's : Needs assistance/impaired                         Toilet Transfer: Contact guard assist;Rolling walker (2 wheels);Comfort height toilet;Ambulation   Toileting- Clothing Manipulation and Hygiene: Sit to/from stand;Contact guard assist       Functional mobility during ADLs: Supervision/safety      Extremity/Trunk Assessment Upper Extremity Assessment Upper Extremity Assessment: Generalized weakness            Vision Patient Visual Report: No change from baseline           Communication Communication Communication: No apparent difficulties   Cognition Arousal: Alert Behavior During Therapy: WFL for tasks assessed/performed Cognition: No apparent impairments                               Following commands: Intact        Cueing   Cueing Techniques: Verbal cues             Pertinent Vitals/ Pain  Pain Assessment Pain Assessment: Faces Faces Pain Scale: Hurts a little bit Pain Location: L knee, L shoulder Pain Descriptors / Indicators: Aching, Discomfort, Grimacing Pain Intervention(s): Monitored during session         Frequency  Min 2X/week        Progress Toward Goals  OT Goals(current goals can now be found in the care plan section)  Progress towards OT goals: Progressing toward goals      AM-PAC OT 6 Clicks Daily Activity     Outcome Measure   Help from another person eating meals?: None Help from another person taking care of personal grooming?: None Help from another person toileting, which includes using toliet, bedpan, or urinal?: A Little Help from another person bathing (including washing, rinsing, drying)?: A  Little Help from another person to put on and taking off regular upper body clothing?: None Help from another person to put on and taking off regular lower body clothing?: A Little 6 Click Score: 21    End of Session Equipment Utilized During Treatment: Rolling walker (2 wheels)  OT Visit Diagnosis: Unsteadiness on feet (R26.81);Repeated falls (R29.6);Muscle weakness (generalized) (M62.81);Pain   Activity Tolerance Patient tolerated treatment well   Patient Left with call bell/phone within reach;in chair;with chair alarm set   Nurse Communication Mobility status        Time: 8894-8873 OT Time Calculation (min): 21 min  Charges: OT General Charges $OT Visit: 1 Visit OT Treatments $Therapeutic Activity: 8-22 mins  Izetta Claude, MS, OTR/L , CBIS ascom [phone]  11/26/23, 1:54 PM

## 2023-11-26 NOTE — TOC Progression Note (Signed)
 Transition of Care (TOC) - Progression Note    Patient Details  Name: [name]. MRN: [id number] Date of Birth: Apr 17, 1960  Transition of Care [facility]) CM/SW Contact  Lauraine JAYSON Carpen, LCSW Phone Number: 11/26/2023, 2:51 PM  Clinical Narrative:  PT and OT have changed recommendation to home health. Went by room to talk to patient but he was asleep and did not wake up to CSW calling his name. Will try again later.   Expected Discharge Plan: Skilled Nursing Facility Barriers to Discharge: English as a second language teacher, Continued Medical Work up, Engineer, mining)               Expected Discharge Plan and Services In-house Referral: Clinical Social Work   Post Acute Care Choice: Skilled Nursing Facility Living arrangements for the past 2 months: Single Family Home                                       Social Drivers of Health (SDOH) Interventions SDOH Screenings   Food Insecurity: No Food Insecurity (11/22/2023)  Housing: Low Risk  (11/22/2023)  Transportation Needs: No Transportation Needs (11/22/2023)  Utilities: Not At Risk (11/22/2023)  Depression (PHQ2-9): Low Risk  (11/12/2023)  Recent Concern: Depression (PHQ2-9) - High Risk (10/01/2023)  Social Connections: Socially Integrated (04/11/2023)  Tobacco Use: Low Risk  (11/22/2023)  Recent Concern: Tobacco Use - Medium Risk (11/04/2023)    Readmission Risk Interventions    04/09/2023   12:05 PM 06/24/2022    9:39 AM 11/04/2021   11:44 AM  Readmission Risk Prevention Plan  Transportation Screening Complete Complete Complete  HRI or Home Care Consult  --   Social Work Consult for Recovery Care Planning/Counseling  Complete   Palliative Care Screening  Not Applicable   Medication Review Oceanographer) Complete Complete Complete  PCP or Specialist appointment within 3-5 days of discharge Complete  Complete  HRI or Home Care Consult   Complete  SW Recovery Care/Counseling Consult Complete  Complete   Palliative Care Screening Not Applicable  Not Applicable  Skilled Nursing Facility Not Applicable  Not Applicable

## 2023-11-26 NOTE — Progress Notes (Signed)
 Physical Therapy Treatment Patient Details Name: [name]. MRN: [id number] DOB: June 30, 1960 Today's Date: 11/26/2023   History of Present Illness [name]. is a pleasant 63 y.o. male with medical history significant for history of ESRD s/p renal transplant in 2012, currently on immunosuppression therapy, history of heart failure last EF was 40 to 45% in July 2025, on torsemide , A-fib on Eliquis , CAD, obesity on CPAP who presented to ED at [facility] today after a fall at home and generalized weakness.    PT Comments  Pt received upright in recliner motivated to attempt gait. Pt has improved skin color, appears like he is feeling better. Reports verbally that he is. States L knee is improved since Dr. Edie consult. Pt able to STS to RW at supervision completing 3 laps at NSG station. VC's to limit LUE WB through L shoulder and only needing x1 standing rest break. HR in low 100's with mobility. Normal reciprocal gait appreciated. Education provided on LE sequencing in relation to acute meniscus tear for pain modulation and limit risk of L knee buckling with asc/desc steps. Pt understanding. Pt reports need for BM. Indep with toileting, pericare, hand hygiene. Returns to recliner with all needs in reach. Pt in agreement to update d/c recs. TOC and OT informed of updated PT recs. All needs in reach upon exit.    If plan is discharge home, recommend the following: A little help with walking and/or transfers;A little help with bathing/dressing/bathroom;Assistance with cooking/housework;Assist for transportation;Help with stairs or ramp for entrance   Can travel by private vehicle        Equipment Recommendations  None recommended by PT    Recommendations for Other Services       Precautions / Restrictions Precautions Precautions: Fall Precaution/Restrictions Comments: old left shoulder fx early June Restrictions Weight Bearing Restrictions Per Provider Order: No      Mobility  Bed Mobility               General bed mobility comments: NT. In recliner pre and post session Patient Response: Cooperative  Transfers Overall transfer level: Needs assistance Equipment used: Rolling walker (2 wheels) Transfers: Sit to/from Stand Sit to Stand: Supervision                Ambulation/Gait Ambulation/Gait assistance: Supervision Gait Distance (Feet): 520 Feet Assistive device: Rolling walker (2 wheels) Gait Pattern/deviations: Step-through pattern       General Gait Details: uses RW successfully. Only x1 standing rest break during bout for recovery. VC's to minimize LUE WB through RW due to healing L humeral fx.   Stairs             Wheelchair Mobility     Tilt Bed Tilt Bed Patient Response: Cooperative  Modified Rankin (Stroke Patients Only)       Balance Overall balance assessment: Mild deficits observed, not formally tested, Needs assistance Sitting-balance support: No upper extremity supported, Feet supported Sitting balance-Leahy Scale: Normal     Standing balance support: During functional activity, No upper extremity supported Standing balance-Leahy Scale: Fair                              Hotel manager: No apparent difficulties  Cognition Arousal: Alert Behavior During Therapy: WFL for tasks assessed/performed   PT - Cognitive impairments: No apparent impairments  PT - Cognition Comments: motivated to progress mobility as pt reports feeling better Following commands: Intact      Cueing Cueing Techniques: Verbal cues  Exercises Other Exercises Other Exercises: discussed appropriate stair navigation/LE sequencing due to acute meniscus tear found from fall at home in L knee.    General Comments        Pertinent Vitals/Pain Pain Assessment Pain Assessment: Faces Faces Pain Scale: Hurts a little bit Pain Location: L knee, L  shoulder Pain Descriptors / Indicators: Aching, Discomfort, Grimacing Pain Intervention(s): Monitored during session, Repositioned    Home Living                          Prior Function            PT Goals (current goals can now be found in the care plan section) Acute Rehab PT Goals Patient Stated Goal: improve mobility PT Goal Formulation: With patient Time For Goal Achievement: 12/07/23 Potential to Achieve Goals: Good Progress towards PT goals: Progressing toward goals    Frequency    Min 3X/week      PT Plan      Co-evaluation              AM-PAC PT 6 Clicks Mobility   Outcome Measure  Help needed turning from your back to your side while in a flat bed without using bedrails?: A Little Help needed moving from lying on your back to sitting on the side of a flat bed without using bedrails?: A Little Help needed moving to and from a bed to a chair (including a wheelchair)?: A Little Help needed standing up from a chair using your arms (e.g., wheelchair or bedside chair)?: A Little Help needed to walk in hospital room?: A Little Help needed climbing 3-5 steps with a railing? : A Little 6 Click Score: 18    End of Session Equipment Utilized During Treatment: Gait belt Activity Tolerance: Patient tolerated treatment well Patient left: in chair;with call bell/phone within reach;with chair alarm set Nurse Communication: Mobility status PT Visit Diagnosis: Muscle weakness (generalized) (M62.81);History of falling (Z91.81);Difficulty in walking, not elsewhere classified (R26.2);Pain Pain - Right/Left: Left Pain - part of body: Shoulder;Knee     Time: 9055-8989 PT Time Calculation (min) (ACUTE ONLY): 26 min  Charges:    $Therapeutic Exercise: 8-22 mins $Therapeutic Activity: 8-22 mins PT General Charges $$ ACUTE PT VISIT: 1 Visit                     Dorina HERO. Fairly IV, PT, DPT Physical Therapist- [place]  [facility] 11/26/2023, 11:37 AM

## 2023-11-26 NOTE — Plan of Care (Signed)
  Problem: Clinical Measurements: Goal: Diagnostic test results will improve Outcome: Progressing   Problem: Clinical Measurements: Goal: Signs and symptoms of infection will decrease Outcome: Progressing   Problem: Respiratory: Goal: Ability to maintain adequate ventilation will improve Outcome: Progressing   Problem: Coping: Goal: Ability to adjust to condition or change in health will improve Outcome: Progressing   Problem: Skin Integrity: Goal: Risk for impaired skin integrity will decrease Outcome: Progressing

## 2023-11-26 NOTE — Care Management Important Message (Signed)
 Important Message  Patient Details  Name: [name]. MRN: [id number] Date of Birth: 05-09-1960   Important Message Given:  Yes - Medicare IM     [name] 11/26/2023, 8:24 AM

## 2023-11-26 NOTE — Plan of Care (Signed)

## 2023-11-27 ENCOUNTER — Other Ambulatory Visit: Payer: Self-pay

## 2023-11-27 DIAGNOSIS — N178 Other acute kidney failure: Secondary | ICD-10-CM

## 2023-11-27 DIAGNOSIS — I5022 Chronic systolic (congestive) heart failure: Secondary | ICD-10-CM | POA: Diagnosis not present

## 2023-11-27 DIAGNOSIS — N1832 Chronic kidney disease, stage 3b: Secondary | ICD-10-CM

## 2023-11-27 DIAGNOSIS — Z940 Kidney transplant status: Secondary | ICD-10-CM | POA: Diagnosis not present

## 2023-11-27 LAB — CBC WITH DIFFERENTIAL/PLATELET
Abs Immature Granulocytes: 0.01 K/uL (ref 0.00–0.07)
Basophils Absolute: 0 K/uL (ref 0.0–0.1)
Basophils Relative: 0 %
Eosinophils Absolute: 0 K/uL (ref 0.0–0.5)
Eosinophils Relative: 0 %
HCT: 33.5 % — ABNORMAL LOW (ref 39.0–52.0)
Hemoglobin: 10.4 g/dL — ABNORMAL LOW (ref 13.0–17.0)
Immature Granulocytes: 0 %
Lymphocytes Relative: 9 %
Lymphs Abs: 0.2 K/uL — ABNORMAL LOW (ref 0.7–4.0)
MCH: 28.2 pg (ref 26.0–34.0)
MCHC: 31 g/dL (ref 30.0–36.0)
MCV: 90.8 fL (ref 80.0–100.0)
Monocytes Absolute: 0.1 K/uL (ref 0.1–1.0)
Monocytes Relative: 5 %
Neutro Abs: 2.1 K/uL (ref 1.7–7.7)
Neutrophils Relative %: 86 %
Platelets: 122 K/uL — ABNORMAL LOW (ref 150–400)
RBC: 3.69 MIL/uL — ABNORMAL LOW (ref 4.22–5.81)
RDW: 15 % (ref 11.5–15.5)
WBC: 2.5 K/uL — ABNORMAL LOW (ref 4.0–10.5)
nRBC: 0 % (ref 0.0–0.2)

## 2023-11-27 LAB — CULTURE, BLOOD (ROUTINE X 2)
Culture: NO GROWTH
Culture: NO GROWTH
Special Requests: ADEQUATE
Special Requests: ADEQUATE

## 2023-11-27 LAB — BASIC METABOLIC PANEL WITH GFR
Anion gap: 10 (ref 5–15)
BUN: 82 mg/dL — ABNORMAL HIGH (ref 8–23)
CO2: 28 mmol/L (ref 22–32)
Calcium: 8.9 mg/dL (ref 8.9–10.3)
Chloride: 103 mmol/L (ref 98–111)
Creatinine, Ser: 2.09 mg/dL — ABNORMAL HIGH (ref 0.61–1.24)
GFR, Estimated: 35 mL/min — ABNORMAL LOW (ref >60)
Glucose, Bld: 210 mg/dL — ABNORMAL HIGH (ref 70–99)
Potassium: 4 mmol/L (ref 3.5–5.1)
Sodium: 141 mmol/L (ref 135–145)

## 2023-11-27 LAB — GLUCOSE, CAPILLARY: Glucose-Capillary: 205 mg/dL — ABNORMAL HIGH (ref 70–99)

## 2023-11-27 MED ORDER — APIXABAN 5 MG PO TABS: 5.0000 mg | ORAL_TABLET | Freq: Two times a day (BID) | ORAL | Status: DC

## 2023-11-27 NOTE — Inpatient Diabetes Management (Addendum)
 Inpatient Diabetes Program Recommendations  AACE/ADA: New Consensus Statement on Inpatient Glycemic Control (2015)  Target Ranges:  Prepandial:   less than 140 mg/dL      Peak postprandial:   less than 180 mg/dL (1-2 hours)      Critically ill patients:  140 - 180 mg/dL   Lab Results  Component Value Date   GLUCAP 205 (H) 11/27/2023   HGBA1C 5.5 11/06/2023    Review of Glycemic Control  Latest Reference Range & Units 11/26/23 12:13 11/26/23 16:48 11/26/23 21:40 11/27/23 07:42  Glucose-Capillary 70 - 99 mg/dL 786 (H) 769 (H) 772 (H) 205 (H)   Diabetes history: DM 2 Outpatient Diabetes medications:  Mounjaro  5 mg weekly Semglee  14 units daily Farxiga  10 mg daily Current orders for Inpatient glycemic control:  Novolog  0-6 units tid with meals and HS Lantus  14 units daily Inpatient Diabetes Program Recommendations:   Note that patient did receive steroid injection in knee on 11/25/23 which may have increased CBG's.  Per MD, patient will be discharged today.     Thanks,  Randall Bullocks, RN, BC-ADM Inpatient Diabetes Coordinator Pager [phone]  (8a-5p)

## 2023-11-27 NOTE — Plan of Care (Signed)
  Problem: Coping: Goal: Ability to adjust to condition or change in health will improve Outcome: Progressing   Problem: Metabolic: Goal: Ability to maintain appropriate glucose levels will improve Outcome: Progressing   Problem: Education: Goal: Knowledge of General Education information will improve Description: Including pain rating scale, medication(s)/side effects and non-pharmacologic comfort measures Outcome: Progressing   Problem: Health Behavior/Discharge Planning: Goal: Ability to manage health-related needs will improve Outcome: Progressing   Problem: Activity: Goal: Risk for activity intolerance will decrease Outcome: Progressing   Problem: Coping: Goal: Level of anxiety will decrease Outcome: Progressing

## 2023-11-27 NOTE — TOC Transition Note (Signed)
 Transition of Care [facility]) - Discharge Note   Patient Details  Name: [name]. MRN: [id number] Date of Birth: [birth year]  Transition of Care [facility]) CM/SW Contact:  Lauraine JAYSON Carpen, LCSW Phone Number: 11/27/2023, 10:37 AM   Clinical Narrative:  Patient has orders to discharge home today. [facility] liaison is aware. Patient stated he already has a RW and 3-in-1. He will work on finding a ride home. No further concerns. CSW signing off.   Final next level of care: Home w Home Health Services Barriers to Discharge: Barriers Resolved   Patient Goals and CMS Choice Patient states their goals for this hospitalization and ongoing recovery are:: Pt wants SNF to get better then home CMS Medicare.gov Compare Post Acute Care list provided to:: Patient Choice offered to / list presented to : Patient      Discharge Placement                    Patient and family notified of of transfer: 11/27/23  Discharge Plan and Services Additional resources added to the After Visit Summary for   In-house Referral: Clinical Social Work   Post Acute Care Choice: Skilled Nursing Facility                    HH Arranged: RN, PT, OT, Nurse's Aide HH Agency: [facility] Date [facility] Agency Contacted: 11/27/23   Representative spoke with at [facility] Agency: Leotis  Social Drivers of Health (SDOH) Interventions SDOH Screenings   Food Insecurity: No Food Insecurity (11/22/2023)  Housing: Low Risk  (11/22/2023)  Transportation Needs: No Transportation Needs (11/22/2023)  Utilities: Not At Risk (11/22/2023)  Depression (PHQ2-9): Low Risk  (11/12/2023)  Recent Concern: Depression (PHQ2-9) - High Risk (10/01/2023)  Social Connections: Socially Integrated (04/11/2023)  Tobacco Use: Low Risk  (11/22/2023)  Recent Concern: Tobacco Use - Medium Risk (11/04/2023)     Readmission Risk Interventions    04/09/2023   12:05 PM 06/24/2022    9:39 AM 11/04/2021   11:44 AM  Readmission Risk  Prevention Plan  Transportation Screening Complete Complete Complete  HRI or Home Care Consult  --   Social Work Consult for Recovery Care Planning/Counseling  Complete   Palliative Care Screening  Not Applicable   Medication Review Oceanographer) Complete Complete Complete  PCP or Specialist appointment within 3-5 days of discharge Complete  Complete  HRI or Home Care Consult   Complete  SW Recovery Care/Counseling Consult Complete  Complete  Palliative Care Screening Not Applicable  Not Applicable  Skilled Nursing Facility Not Applicable  Not Applicable

## 2023-11-27 NOTE — Discharge Summary (Signed)
 Physician Discharge Summary   Patient: [name]. MRN: [id number] DOB: Sep 24, 1960  Admit date:     11/22/2023  Discharge date: 11/27/23  Discharge Physician: [name]   PCP: Carl Baller, MD   Recommendations at discharge:   Patient will be followed with PCP in 1 week. Follow-up with nephrology as soon as possible. Follow-up with urology to be seen in 1 week, possible removal of Foley catheter. Hold the Bactrim  until seen by PCP or nephrology. Check a BMP at next office visit. Hold off Eliquis , restart in 3 days. All above advice has been communicated with patient, he voiced understanding.  Discharge Diagnoses: Active Problems:   CAD s/p CABG 2005   Chronic HFrEF (heart failure with reduced ejection fraction) (HCC)   Hypertension   Hyperlipidemia   Renal transplant recipient, 2012   Ischemic cardiomyopathy   OSA (obstructive sleep apnea)   H/O gastric bypass   Type II diabetes mellitus with renal manifestations (HCC)   Immunosuppressed status (HCC)   Acute renal failure superimposed on stage 3b chronic kidney disease (HCC)   Sepsis (HCC) Chronic pancytopenia.  Resolved Problems:   * No resolved hospital problems. * Hypokalemia  Hospital Course: From HPI [name]. is a pleasant 63 y.o. male with medical history significant for history of ESRD s/p renal transplant in 2012, currently on immunosuppression therapy, history of heart failure last EF was 40 to 45% in July 2025, on torsemide , A-fib on Eliquis , CAD, obesity on CPAP who presented to ED at [facility] today after a fall at home and generalized weakness.  Per patient's wife patient slid down off his recliner this morning.  He has been weak and has been sleeping out of his recliner multiple times last couple days.   Upon arrival to the ED, patient is found to be hypotensive around 88/48.  His creatinine went up to 3.87 with baseline around 2. Patient received fluids, renal function back to  baseline.  Currently medically stable for discharge  Assessment and Plan:  AKI on CKDIIIb: w/ hx of renal transplant & on immunosuppresants.  Received IV fluids and renal function has back to baseline.  He was seen by nephrology, they had signed off after renal function improved Holding home bactrim .  Continue on home dose of steroids, tacrolimus , mycophenolate .  Also hold diuretics and ARB until seen by PCP or nephrology.     Sepsis with immunosuppression: met criteria w/ tachycardia, tachypnea and leukopenia but unknown source of infection. CXR was neg for pneumonia. UA was positive for Hbg & glucose.  Patient so far has completed 5 days antibiotics.   Left knee pain: medial meniscal tear  fall at home.  Impaired mobility from knee pain MRI showing medial meniscal tear as well as moderate to large joint effusion Patient is seen by orthopedics, had joint injection.  Pain much better. Patient also walked better today after joint injection, no longer want nursing home.  Will discharge home today. Follow-up with orthopedics in 1 month   Urinary retention secondary to benign prostate hypertrophy. Gross hematuria secondary to Foley catheter placement Patient had a Foley catheter placed at time of admission, Flomax  started.  Patient still has some gross hematuria from the initial trauma from Foley catheter.  After holding Eliquis , hematuria has resolved.  Will continue to hold it for additional 3 days.     Rectal bleeding: w/ recent hx of biopsy of rectum & hemorrhoid removed. Likely etiology of rectal bleeding.  Rectal bleeding has resolved.  PAF:  Hold off Eliquis  due to gross hematuria.  Ordered restart after 3 days.   Chronic systolic CHF: Currently euvolemic, hold off diuretics for now.   OSA: on CPAP   DM2: Uncontrolled with hyperglycemia on long-term insulin  use. Follow-up with PCP to adjust dose.  Zoom at home regimens.   Hx of gout: continue on colchicine       Class II  obesity: BMI 34.0. Would benefit from weight loss         Consultants: Nephrology Procedures performed: None  Disposition: Home Diet recommendation:  Discharge Diet Orders (From admission, onward)     Start     Ordered   11/27/23 0000  Diet - low sodium heart healthy        11/27/23 1016           Cardiac diet DISCHARGE MEDICATION: Allergies as of 11/27/2023       Reactions   Iodinated Contrast Media Other (See Comments)   Kidney transplant Iodinated contrast media (substance)   Iodine Other (See Comments)   Kidney transplant   Nsaids Other (See Comments)   Kidney transplant   Ibuprofen Other (See Comments)   Due to kidney transplant        Medication List     STOP taking these medications    losartan  25 MG tablet Commonly known as: COZAAR    metolazone  2.5 MG tablet Commonly known as: ZAROXOLYN    potassium chloride  SA 20 MEQ tablet Commonly known as: KLOR-CON  M   silver  sulfADIAZINE  1 % cream Commonly known as: SILVADENE    sulfamethoxazole -trimethoprim  400-80 MG tablet Commonly known as: BACTRIM    Testosterone  20.25 MG/ACT (1.62%) Gel   tiZANidine  2 MG tablet Commonly known as: ZANAFLEX    torsemide  100 MG tablet Commonly known as: DEMADEX        TAKE these medications    albuterol  (2.5 MG/3ML) 0.083% nebulizer solution Commonly known as: PROVENTIL  Inhale one vial (3 ml) via nebulizer every 6 hours as needed for wheezing or shortness of breath (Take 3 mLs (2.5 mg total) by nebulization every 6 (six) hours as needed for wheezing or shortness of breath.)   albuterol  108 (90 Base) MCG/ACT inhaler Commonly known as: VENTOLIN  HFA Inhale 2 puffs into the lungs every 6 (six) hours as needed for wheezing or shortness of breath.   apixaban  5 MG Tabs tablet Commonly known as: Eliquis  Take 1 tablet (5 mg total) by mouth 2 (two) times daily. Start taking on: November 30, 2023 What changed: These instructions start on November 30, 2023. If you are  unsure what to do until then, ask your doctor or other care provider.   B-12 PO Take 1 tablet by mouth daily.   buPROPion  150 MG 12 hr tablet Commonly known as: WELLBUTRIN  SR Take 1 tablet (150 mg total) by mouth 2 (two) times daily.   colchicine  0.6 MG tablet Take 0.6 mg by mouth every other day.   diazepam  5 MG tablet Commonly known as: VALIUM  Take 1 tablet (5 mg total) by mouth every 8 (eight) hours as needed for muscle spasms (difficulty urinating).   Farxiga  10 MG Tabs tablet Generic drug: dapagliflozin  propanediol Take 1 tablet (10 mg total) by mouth daily before breakfast.   febuxostat  40 MG tablet Commonly known as: ULORIC  Take 3 tablets (120 mg total) by mouth daily.   fluticasone  50 MCG/ACT nasal spray Commonly known as: FLONASE  Place 2 sprays into both nostrils daily. What changed:  when to take this reasons to take this  FreeStyle Libre 3 Sensor Misc Use one every 2 weeks.   gabapentin  300 MG capsule Commonly known as: NEURONTIN  Take 1 capsule (300 mg total) by mouth 2 (two) times daily.   montelukast  10 MG tablet Commonly known as: SINGULAIR  Take 1 tablet (10 mg total) by mouth at bedtime.   Mounjaro  5 MG/0.5ML Pen Generic drug: tirzepatide  Inject 5 mg into the skin once a week.   mycophenolate  180 MG EC tablet Commonly known as: MYFORTIC  Take 2 tablets (360 mg total) by mouth every morning AND 1 tablet (180 mg total) every evening.   omeprazole  20 MG capsule Commonly known as: PRILOSEC Take 1 capsule (20 mg total) by mouth daily.   oxyCODONE -acetaminophen  10-325 MG tablet Commonly known as: Percocet Take 1 tablet by mouth every 6 (six) hours as needed for pain.   predniSONE  5 MG tablet Commonly known as: DELTASONE  Take 1 tablet (5 mg total) by mouth daily.   rosuvastatin  10 MG tablet Commonly known as: CRESTOR  Take 1 tablet (10 mg total) by mouth daily.   Semglee  (yfgn) 100 UNIT/ML Pen Generic drug: insulin  glargine-yfgn Inject 20  Units into the skin daily. What changed: how much to take   sodium chloride  0.65 % Soln nasal spray Commonly known as: OCEAN Place 2 sprays into both nostrils every 8 (eight) hours for 10 days. What changed:  when to take this reasons to take this   tacrolimus  1 MG capsule Commonly known as: PROGRAF  Take 2 capsules (2 mg total) by mouth every morning AND 1 capsule (1 mg total) every evening.   tadalafil  5 MG tablet Commonly known as: CIALIS  Take 5 mg by mouth daily as needed for erectile dysfunction. What changed: Another medication with the same name was removed. Continue taking this medication, and follow the directions you see here.   tamsulosin  0.4 MG Caps capsule Commonly known as: FLOMAX  Take 1 capsule (0.4 mg total) by mouth daily.   Unifine Pentips 31G X 5 MM Misc Generic drug: Insulin  Pen Needle Use 4 times daily as directed   Insupen Pen Needles 31G X 5 MM Misc Generic drug: Insulin  Pen Needle 4 (four) times daily.   Zinc  50 MG Tabs Take 1 tablet (50 mg total) by mouth daily.        Follow-up Information     Carl Baller, MD Follow up in 1 week(s).   Specialty: Family Medicine Contact information: [address] [place] [address] [phone]         Twylla Glendia BROCKS, MD Follow up in 1 week(s).   Specialty: Urology Contact information: [address] [place] [address] [phone]                Discharge Exam: Fredricka Weights   11/22/23 1034  Weight: 107.5 kg   General exam: Appears calm and comfortable  Respiratory system: Clear to auscultation. Respiratory effort normal. Cardiovascular system: S1 & S2 heard, RRR. No JVD, murmurs, rubs, gallops or clicks. No pedal edema. Gastrointestinal system: Abdomen is nondistended, soft and nontender. No organomegaly or masses felt. Normal bowel sounds heard. Central nervous system: Alert and oriented. No focal neurological deficits. Extremities: Symmetric 5  x 5 power. Skin: No rashes, lesions or ulcers Psychiatry: Judgement and insight appear normal. Mood & affect appropriate.    Condition at discharge: good  The results of significant diagnostics from this hospitalization (including imaging, microbiology, ancillary and laboratory) are listed below for reference.   Imaging Studies: MR KNEE LEFT  WO CONTRAST Result Date: 11/25/2023 CLINICAL DATA:  Knee trauma, internal derangement suspected, xray done EXAM: MR KNEE*L* W/O CM TECHNIQUE: Multiplanar, multisequence MR imaging of the knee was performed. No intravenous contrast was administered. COMPARISON:  Left knee radiographs dated 11/23/2022. FINDINGS: MENISCI Medial: Horizontally oriented irregular hyperintense signal at the body and posterior horn of the medial meniscus is suspicious for a tear. Lateral: Discoid morphology of the lateral meniscus without discrete tear. LIGAMENTS Cruciates: ACL and PCL are intact. Collaterals: Medial collateral ligament is intact. Lateral collateral ligament complex is intact. CARTILAGE Patellofemoral:  No chondral defect. Medial: Mild partial-thickness cartilage loss of the medial femorotibial compartment. Lateral:  No chondral defect. JOINT: Moderate-to-large joint effusion. Edema in Hoffa's fat-pad is likely reactive. POPLITEAL FOSSA: Popliteus tendon is intact. No Baker's cyst. EXTENSOR MECHANISM: Intact quadriceps tendon. Thickening and intermediate signal of the patellar tendon, most compatible with tendinosis. BONES: No aggressive osseous lesion. No fracture or dislocation. Other: Nonspecific subcutaneous edema of the knee, most pronounced posteriorly. No loculated fluid collection. IMPRESSION: 1. Findings suspicious for horizontal tear of the body and posterior horn of the medial meniscus. 2. Discoid morphology of the lateral meniscus without discrete tear. 3. Moderate to large knee joint effusion. 4. Patellar tendinosis. 5. Mild partial-thickness cartilage loss of the  medial femorotibial compartment. 6. Nonspecific subcutaneous edema of the knee, most pronounced posteriorly. Electronically Signed   By: Harrietta Sherry M.D.   On: 11/25/2023 12:25   US  Renal Transplant w/Doppler Result Date: 11/23/2023 EXAM: RETROPERITONEAL ULTRASOUND OF THE KIDNEYS 11/23/2023 03:31:55 PM TECHNIQUE: Real-time ultrasonography of the retroperitoneum, specifically the kidneys and urinary bladder, was performed. COMPARISON: Renal transplant ultrasound 06/21/2022. CLINICAL HISTORY: 409830 AKI (acute kidney injury) (HCC) 409830. AKI (acute kidney injury) (HCC) FINDINGS: TRANSPLANT KIDNEY: The right lower quadrant renal transplant measures 13.3 x 7.0 x 8.0 cm. Estimated volume is 389 ml. Mild hydronephrosis is present. Venous waveform is present in the main renal artery. Resistive index in the main renal artery is 0.88. Resistive index in the upper pole segmental artery is 0.81. Resistive index in the lower pole segmental artery is 0.71. BLADDER: The urinary bladder is distended. IMPRESSION: 1. Right lower quadrant renal transplant with mild hydronephrosis and borderline elevated resistive index in the main renal artery (0.88). Electronically signed by: Lonni Necessary MD 11/23/2023 06:09 PM EDT RP Workstation: [id number]   DG Knee 1-2 Views Left Result Date: 11/23/2023 EXAM: 1 or 2 VIEW(S) XRAY OF THE LEFT KNEE 11/23/2023 10:53:00 AM COMPARISON: None available. CLINICAL HISTORY: Knee pain, left FINDINGS: BONES AND JOINTS: No acute fracture. No focal osseous lesion. No joint dislocation. Small knee joint effusion. Mild medial compartment joint space narrowing. SOFT TISSUES: Dense vascular calcifications. IMPRESSION: 1. Small knee joint effusion. 2. Mild medial compartment joint space narrowing. Electronically signed by: Lonni Necessary MD 11/23/2023 11:15 AM EDT RP Workstation: [id number]   CT CHEST ABDOMEN PELVIS WO CONTRAST Result Date: 11/22/2023 EXAM: CT CHEST, ABDOMEN AND PELVIS  WITHOUT CONTRAST 11/22/2023 12:51:41 PM TECHNIQUE: CT of the chest, abdomen and pelvis was performed without the administration of intravenous contrast. Multiplanar reformatted images are provided for review. Automated exposure control, iterative reconstruction, and/or weight based adjustment of the mA/kV was utilized to reduce the radiation dose to as low as reasonably achievable. COMPARISON: 04/08/2023 CLINICAL HISTORY: Sepsis. Pt to ED with wife for weakness since yesterday. Pt slid out of recliner yesterday and twice today and could not get up today. Pt had rectal biopsy 3 days ago. Restricted on L arm. Hx kidney  transplant, immunosuppressed. BP 88/48, bringing to room now. FINDINGS: CHEST: MEDIASTINUM AND LYMPH NODES: Cardiac enlargement, unchanged. Aortic atherosclerosis. Status post CABG. No pericardial effusion. Thyroid  gland, trachea and esophagus are normal. No enlarged mediastinal or axillary lymph nodes. Calcified mediastinal and hilar lymph nodes compatible with prior granulomatous disease. LUNGS AND PLEURA: New small left pleural effusion. Trace right pleural fluid. Bilateral calcified granulomas identified. No pneumothorax. No airspace consolidation. ABDOMEN AND PELVIS: LIVER: No suspicious liver lesion. Widening of the falciform ligament with relative hypertrophy of the lateral segment of the left lobe and caudate lobe of liver noted. This may be seen in this setting of early cirrhosis. GALLBLADDER AND BILE DUCTS: Status post cholecystectomy. No bile duct dilatation identified. SPLEEN: The spleen is enlarged measuring 15.4 cm craniocaudal. Previously 13.6 cm. PANCREAS: No acute abnormality. ADRENAL GLANDS: No acute abnormality. KIDNEYS, URETERS AND BLADDER: Bilateral end-stage atrophic kidneys containing multiple calcifications. Hyperdense right kidney lesion is incompletely characterized without IV contrast measuring 1.5 cm. Previously 1.5 cm. This measures 95 Hounsfield units and likely represents  a hemorrhagic or proteinaceous cyst. No follow-up imaging recommended. Right lower quadrant renal graft is identified. No hydronephrosis or mass associated with the graft. Bladder is unremarkable. GI AND BOWEL: Previous sleeve gastrectomy. No pathologic dilatation of the large or small bowel loops. The appendix is visualized and appears normal. No signs of bowel inflammation. REPRODUCTIVE ORGANS: No acute abnormality. PERITONEUM AND RETROPERITONEUM: No free fluid or fluid collections within the abdomen or pelvis. No signs of pneumoperitoneum. VASCULATURE: No acute abnormality. ABDOMINAL AND PELVIS LYMPH NODES: No lymphadenopathy. REPRODUCTIVE ORGANS: No acute abnormality. BONES AND SOFT TISSUES: Status post median sternotomy. Scattered remote rib fractures. Partially imaged subacute to chronic fracture deformity is noted involving the proximal left humerus which appears new from the previous exam. Increased subcutaneous soft tissue stranding within the flanks and posterior abdominal wall compatible with anasarca. IMPRESSION: 1. New small left pleural effusion and trace right pleural fluid. 2. Age-indeterminate fracture deformity involving the proximal left humerus appears new from 04/08/2023. 3. Morphologic features of the liver suggestive of cirrhosis. Enlarged spleen measuring 15.4 cm, previously 13.6 cm. 4. Right lower quadrant renal graft without hydronephrosis or mass. 5. Increased subcutaneous soft tissue stranding within the flanks and posterior abdominal wall compatible with anasarca. Electronically signed by: Waddell Calk MD 11/22/2023 01:12 PM EDT RP Workstation: [id number]   CT Cervical Spine Wo Contrast Result Date: 11/22/2023 EXAM: CT CERVICAL SPINE WITHOUT CONTRAST 11/22/2023 12:51:41 PM TECHNIQUE: CT of the cervical spine was performed without the administration of intravenous contrast. Multiplanar reformatted images are provided for review. Automated exposure control, iterative reconstruction,  and/or weight based adjustment of the mA/kV was utilized to reduce the radiation dose to as low as reasonably achievable. COMPARISON: None available. CLINICAL HISTORY: Fall. Pt to ED with wife for weakness since yesterday. Pt slid out of recliner yesterday and twice today and could not get up today. Pt had rectal biopsy 3 days ago. Restricted on L arm. Hx kidney transplant, immunosuppressed. BP 88/48, bringing to room now. FINDINGS: CERVICAL SPINE: BONES AND ALIGNMENT: Straightening of the normal cervical lordosis is present. No acute fracture or traumatic malalignment. DEGENERATIVE CHANGES: Uncovertebral spurring results in moderate left foraminal stenosis at C5-6 and C6-7. Moderate central canal stenosis is present at C3-4 and C4-5. SOFT TISSUES: Calcified right paratracheal lymph nodes are present. VASCULATURE: Atherosclerotic calcifications are present at the carotid bifurcation bilaterally without definite stenosis. IMPRESSION: 1. No acute fracture or traumatic malalignment of the cervical spine. 2. Moderate  left foraminal stenosis at C5-6 and C6-7 due to uncovertebral spurring. 3. Moderate central canal stenosis at C3-4 and C4-5. Electronically signed by: Lonni Necessary MD 11/22/2023 01:05 PM EDT RP Workstation: [id number]   CT Head Wo Contrast Result Date: 11/22/2023 EXAM: CT HEAD WITHOUT CONTRAST 11/22/2023 12:51:41 PM TECHNIQUE: CT of the head was performed without the administration of intravenous contrast. Automated exposure control, iterative reconstruction, and/or weight based adjustment of the mA/kV was utilized to reduce the radiation dose to as low as reasonably achievable. COMPARISON: CT head without contrast 09/26/2023. CLINICAL HISTORY: Fall. Pt to ED with wife for weakness since yesterday. Pt slid out of recliner yesterday and twice today and could not get up today. Pt had rectal biopsy 3 days ago. Restricted on L arm. Hx kidney transplant, immunosuppressed. BP 88/48, bringing to room  now. FINDINGS: BRAIN AND VENTRICLES: No acute hemorrhage. No evidence of acute infarct. No hydrocephalus. No extra-axial collection. No mass effect or midline shift. Periventricular and scattered subcortical white matter hypoattenuation is moderately advanced for age, stable from the prior study. ORBITS: No acute abnormality. SINUSES: Left ethmoid air cells are opacified. The paranasal sinuses and mastoid air cells are otherwise clear. SOFT TISSUES AND SKULL: No acute soft tissue abnormality. No skull fracture. IMPRESSION: 1. No acute intracranial abnormality. 2. Moderately advanced periventricular and scattered subcortical white matter hypoattenuation, stable from the prior study. Electronically signed by: Lonni Necessary MD 11/22/2023 01:00 PM EDT RP Workstation: [id number]   DG Chest Port 1 View if patient is in a treatment room. Result Date: 11/22/2023 EXAM: 1 VIEW XRAY OF THE CHEST 11/22/2023 10:59:00 AM COMPARISON: 11/12/2023 CLINICAL HISTORY: Suspected Sepsis. Table formatting from the original note was not included.; Pt to ED with wife for weakness since yesterday. Pt slid out of recliner yesterday and twice today and could not get up today. Pt had rectal biopsy 3 days ago. Restricted on L arm. Hx kidney transplant, immunosuppressed. BP 88/48, bringing to room now. FINDINGS: LUNGS AND PLEURA: Stable pulmonary vascular congestion . No focal pulmonary opacity. No pneumothorax. Possible trace left pleural effusion. HEART AND MEDIASTINUM: Stable cardiomegaly. Calcified right hilar and paratracheal adenopathy. CABG markers. Sternotomy wires. BONES AND SOFT TISSUES: No acute osseous abnormality. IMPRESSION: 1. Stable cardiomegaly and pulmonary vascular congestion. Electronically signed by: Katheleen Faes MD 11/22/2023 11:04 AM EDT RP Workstation: [id number]   DG Chest Port 1 View Result Date: 11/12/2023 CLINICAL DATA:  [id number] Weakness [id number] EXAM: PORTABLE CHEST 1 VIEW COMPARISON:  09/26/2023. FINDINGS:  Low lung volume. Redemonstration of multiple surgical staples overlying the left upper hemithorax. There is stable calcified right paratracheal lymph node. There is mild diffuse pulmonary vascular congestion, likely accentuated by low lung volume. No frank pulmonary edema. Bilateral lung fields are otherwise clear. Bilateral costophrenic angles are clear. Stable moderately enlarged cardio-mediastinal silhouette. Sternotomy wires noted. No acute osseous abnormalities. The soft tissues are within normal limits. IMPRESSION: *Mild diffuse pulmonary vascular congestion, likely accentuated by low lung volume. No frank pulmonary edema. Electronically Signed   By: Ree Molt M.D.   On: 11/12/2023 17:55    Microbiology: Results for orders placed or performed during the hospital encounter of 11/22/23  Culture, blood (Routine x 2)     Status: None   Collection Time: 11/22/23 10:52 AM   Specimen: BLOOD RIGHT ARM  Result Value Ref Range Status   Specimen Description BLOOD RIGHT ARM  Final   Special Requests   Final    BOTTLES DRAWN AEROBIC AND ANAEROBIC Blood Culture adequate volume  Culture   Final    NO GROWTH 5 DAYS Performed at [facility], [name] [place], [place] [address]    Report Status 11/27/2023 FINAL  Final  Culture, blood (Routine x 2)     Status: None   Collection Time: 11/22/23 10:53 AM   Specimen: BLOOD RIGHT HAND  Result Value Ref Range Status   Specimen Description BLOOD RIGHT HAND  Final   Special Requests   Final    BOTTLES DRAWN AEROBIC AND ANAEROBIC Blood Culture adequate volume   Culture   Final    NO GROWTH 5 DAYS Performed at [facility], [address] [place], [place] [address]    Report Status 11/27/2023 FINAL  Final  Resp panel by RT-PCR (RSV, Flu A&B, Covid) Anterior Nasal Swab     Status: None   Collection Time: 11/22/23 10:53 AM   Specimen: Anterior Nasal Swab  Result Value Ref Range Status   SARS Coronavirus 2 by RT PCR NEGATIVE  NEGATIVE Final    Comment: (NOTE) SARS-CoV-2 target nucleic acids are NOT DETECTED.  The SARS-CoV-2 RNA is generally detectable in upper respiratory specimens during the acute phase of infection. The lowest concentration of SARS-CoV-2 viral copies this assay can detect is 138 copies/mL. A negative result does not preclude SARS-Cov-2 infection and should not be used as the sole basis for treatment or other patient management decisions. A negative result may occur with  improper specimen collection/handling, submission of specimen other than nasopharyngeal swab, presence of viral mutation(s) within the areas targeted by this assay, and inadequate number of viral copies(<138 copies/mL). A negative result must be combined with clinical observations, patient history, and epidemiological information. The expected result is Negative.  Fact Sheet for Patients:  BloggerCourse.com  Fact Sheet for Healthcare Providers:  SeriousBroker.it  This test is no t yet approved or cleared by the [place]  FDA and  has been authorized for detection and/or diagnosis of SARS-CoV-2 by FDA under an Emergency Use Authorization (EUA). This EUA will remain  in effect (meaning this test can be used) for the duration of the COVID-19 declaration under Section 564(b)(1) of the Act, 21 U.S.C.section 360bbb-3(b)(1), unless the authorization is terminated  or revoked sooner.       Influenza A by PCR NEGATIVE NEGATIVE Final   Influenza B by PCR NEGATIVE NEGATIVE Final    Comment: (NOTE) The Xpert Xpress SARS-CoV-2/FLU/RSV plus assay is intended as an aid in the diagnosis of influenza from Nasopharyngeal swab specimens and should not be used as a sole basis for treatment. Nasal washings and aspirates are unacceptable for Xpert Xpress SARS-CoV-2/FLU/RSV testing.  Fact Sheet for Patients: BloggerCourse.com  Fact Sheet for Healthcare  Providers: SeriousBroker.it  This test is not yet approved or cleared by the [place]  FDA and has been authorized for detection and/or diagnosis of SARS-CoV-2 by FDA under an Emergency Use Authorization (EUA). This EUA will remain in effect (meaning this test can be used) for the duration of the COVID-19 declaration under Section 564(b)(1) of the Act, 21 U.S.C. section 360bbb-3(b)(1), unless the authorization is terminated or revoked.     Resp Syncytial Virus by PCR NEGATIVE NEGATIVE Final    Comment: (NOTE) Fact Sheet for Patients: BloggerCourse.com  Fact Sheet for Healthcare Providers: SeriousBroker.it  This test is not yet approved or cleared by the [place]  FDA and has been authorized for detection and/or diagnosis of SARS-CoV-2 by FDA under an Emergency Use Authorization (EUA). This EUA will remain in effect (  meaning this test can be used) for the duration of the COVID-19 declaration under Section 564(b)(1) of the Act, 21 U.S.C. section 360bbb-3(b)(1), unless the authorization is terminated or revoked.  Performed at [facility], [address] [place], [place] [address]   MRSA Next Gen by PCR, Nasal     Status: None   Collection Time: 11/24/23  4:03 AM   Specimen: Nasal Mucosa; Nasal Swab  Result Value Ref Range Status   MRSA by PCR Next Gen NOT DETECTED NOT DETECTED Final    Comment: (NOTE) The GeneXpert MRSA Assay (FDA approved for NASAL specimens only), is one component of a comprehensive MRSA colonization surveillance program. It is not intended to diagnose MRSA infection nor to guide or monitor treatment for MRSA infections. Test performance is not FDA approved in patients less than 50 years old. Performed at [facility], [address] [place], [place] [address]   Body fluid culture w Gram Stain     Status: None (Preliminary result)   Collection Time:  11/25/23  7:31 PM   Specimen: Synovium; Synovial Fluid  Result Value Ref Range Status   Specimen Description   Final    SYNOVIAL Performed at [facility], [address] [place], [place] [address]    Special Requests   Final    JOINT KNEE Performed at [facility], [address] [place], [place] [address]    Gram Stain NO WBC SEEN NO ORGANISMS SEEN   Final   Culture   Final    NO GROWTH < 12 HOURS Performed at [facility], [address] [place], [place] [address]    Report Status PENDING  Incomplete   *Note: Due to a large number of results and/or encounters for the requested time period, some results have not been displayed. A complete set of results can be found in Results Review.    Labs: CBC: Recent Labs  Lab 11/22/23 1052 11/23/23 0453 11/23/23 1617 11/24/23 0405 11/25/23 0509 11/26/23 0446 11/27/23 0411  WBC 4.6 3.9*  --  3.3* 3.4* 3.3* 2.5*  NEUTROABS 3.7  --   --   --  2.4 2.8 2.1  HGB 9.9* 10.0* 9.8* 10.0* 10.3* 10.4* 10.4*  HCT 33.3* 34.1* 32.7* 32.5* 33.2* 34.0* 33.5*  MCV 97.7 96.3  --  93.1 92.7 93.2 90.8  PLT 93* 92*  --  100* 101* 118* 122*   Basic Metabolic Panel: Recent Labs  Lab 11/23/23 0453 11/24/23 0405 11/25/23 0509 11/26/23 0446 11/27/23 0411  NA 140 140 144 144 141  K 3.8 3.3* 3.2* 4.0 4.0  CL 100 102 104 105 103  CO2 28 28 29 28 28   GLUCOSE 131* 119* 130* 150* 210*  BUN 95* 90* 82* 76* 82*  CREATININE 3.57* 2.87* 2.41* 2.00* 2.09*  CALCIUM  8.4* 8.4* 8.5* 8.8* 8.9   Liver Function Tests: Recent Labs  Lab 11/22/23 1140 11/23/23 0453  AST 18 14*  ALT 16 14  ALKPHOS 136* 126  BILITOT 0.4 0.8  PROT 6.8 7.0  ALBUMIN  3.5 3.3*   CBG: Recent Labs  Lab 11/26/23 0807 11/26/23 1213 11/26/23 1648 11/26/23 2140 11/27/23 0742  GLUCAP 177* 213* 230* 227* 205*    Discharge time spent: 35 minutes.  Signed: Murvin Mana, MD [facility] 11/27/2023

## 2023-11-27 NOTE — Progress Notes (Signed)
 Physical Therapy Treatment Patient Details Name: [name]. MRN: [id number] DOB: 07/21/1960 Today's Date: 11/27/2023   History of Present Illness [name]. is a pleasant 63 y.o. male with medical history significant for history of ESRD s/p renal transplant in 2012, currently on immunosuppression therapy, history of heart failure last EF was 40 to 45% in July 2025, on torsemide , A-fib on Eliquis , CAD, obesity on CPAP who presented to ED at [facility] today after a fall at home and generalized weakness.    PT Comments  Pt received in hallway ambulating with NSG. Agreeable for stairs assessment. Pt completes 2 laps gait using RW. Notable reduction in UE support noted. Able to asc/desc 3 steps for home set up with correct technique and LE sequencing in setting of acute L knee pain from meniscal tear. Pt returned to room with seated rest. Agreeable to assess gait with no AD. Able to complete additional lap no AD at [facility]. Similar gait speed as with RW, maintains reciprocal pattern. No evidence of antalgic gait or buckling of L knee reported. Pt re-assured with progress in functional mobility. Pt left in recliner with all needs. Encouraged pt to use RW if he feels instances of L knee instability or pain to reduce falls risk as needed. Pt understanding. D/c recs remain appropriate.    If plan is discharge home, recommend the following: A little help with walking and/or transfers;A little help with bathing/dressing/bathroom;Assistance with cooking/housework;Assist for transportation;Help with stairs or ramp for entrance   Can travel by private vehicle     Yes  Equipment Recommendations  None recommended by PT    Recommendations for Other Services       Precautions / Restrictions Precautions Precautions: Fall Precaution/Restrictions Comments: old left shoulder fx early June Restrictions Weight Bearing Restrictions Per Provider Order: No     Mobility  Bed Mobility                General bed mobility comments: seated in recliner chair at beginning and end of session Patient Response: Cooperative  Transfers Overall transfer level: Needs assistance Equipment used: Rolling walker (2 wheels), None Transfers: Sit to/from Stand Sit to Stand: Supervision                Ambulation/Gait Ambulation/Gait assistance: Contact guard assist, Supervision Gait Distance (Feet): 600 Feet Assistive device: Rolling walker (2 wheels), None Gait Pattern/deviations: Step-through pattern       General Gait Details: 2 laps with RW. Additional lap with no AD completing step through gait at similar gait speeds as with RW.   Stairs Stairs: Yes Stairs assistance: Supervision Stair Management: One rail Left, Step to pattern, Sideways Number of Stairs: 3 General stair comments: asc/desc twice with RUE use on railing to mimic home set up. Able to complete with correct LE sequencing due to acute L knee pain.   Wheelchair Mobility     Tilt Bed Tilt Bed Patient Response: Cooperative  Modified Rankin (Stroke Patients Only)       Balance Overall balance assessment: Mild deficits observed, not formally tested                                          Communication Communication Communication: No apparent difficulties  Cognition Arousal: Alert Behavior During Therapy: WFL for tasks assessed/performed   PT - Cognitive impairments: No apparent impairments  Following commands: Intact      Cueing Cueing Techniques: Verbal cues  Exercises      General Comments        Pertinent Vitals/Pain Pain Assessment Pain Assessment: Faces Faces Pain Scale: Hurts a little bit Pain Location: L shoulder Pain Descriptors / Indicators: Aching, Discomfort, Grimacing Pain Intervention(s): Monitored during session    Home Living                          Prior Function            PT Goals (current goals  can now be found in the care plan section) Acute Rehab PT Goals Patient Stated Goal: improve mobility PT Goal Formulation: With patient Time For Goal Achievement: 12/07/23 Potential to Achieve Goals: Good Progress towards PT goals: Progressing toward goals    Frequency    Min 3X/week      PT Plan      Co-evaluation              AM-PAC PT 6 Clicks Mobility   Outcome Measure  Help needed turning from your back to your side while in a flat bed without using bedrails?: A Little Help needed moving from lying on your back to sitting on the side of a flat bed without using bedrails?: A Little Help needed moving to and from a bed to a chair (including a wheelchair)?: A Little Help needed standing up from a chair using your arms (e.g., wheelchair or bedside chair)?: A Little Help needed to walk in hospital room?: A Little Help needed climbing 3-5 steps with a railing? : A Little 6 Click Score: 18    End of Session Equipment Utilized During Treatment: Gait belt Activity Tolerance: Patient tolerated treatment well Patient left: in chair;with call bell/phone within reach;with chair alarm set Nurse Communication: Mobility status PT Visit Diagnosis: Muscle weakness (generalized) (M62.81);History of falling (Z91.81);Difficulty in walking, not elsewhere classified (R26.2);Pain Pain - Right/Left: Left Pain - part of body: Shoulder;Knee     Time: 9081-9065 PT Time Calculation (min) (ACUTE ONLY): 16 min  Charges:    $Gait Training: 8-22 mins PT General Charges $$ ACUTE PT VISIT: 1 Visit                    Dorina HERO. Fairly IV, PT, DPT Physical Therapist- [place] 11/27/2023, 10:54 AM

## 2023-11-28 ENCOUNTER — Other Ambulatory Visit: Payer: Self-pay

## 2023-11-28 DIAGNOSIS — D696 Thrombocytopenia, unspecified: Secondary | ICD-10-CM | POA: Diagnosis not present

## 2023-11-28 DIAGNOSIS — I5042 Chronic combined systolic (congestive) and diastolic (congestive) heart failure: Secondary | ICD-10-CM | POA: Diagnosis not present

## 2023-11-28 DIAGNOSIS — I272 Pulmonary hypertension, unspecified: Secondary | ICD-10-CM | POA: Diagnosis not present

## 2023-11-28 DIAGNOSIS — I700 Atherosclerosis of aorta: Secondary | ICD-10-CM | POA: Diagnosis not present

## 2023-11-28 DIAGNOSIS — E119 Type 2 diabetes mellitus without complications: Secondary | ICD-10-CM | POA: Diagnosis not present

## 2023-11-28 DIAGNOSIS — E662 Morbid (severe) obesity with alveolar hypoventilation: Secondary | ICD-10-CM | POA: Diagnosis not present

## 2023-11-28 DIAGNOSIS — I4821 Permanent atrial fibrillation: Secondary | ICD-10-CM | POA: Diagnosis not present

## 2023-11-28 DIAGNOSIS — D61818 Other pancytopenia: Secondary | ICD-10-CM | POA: Diagnosis not present

## 2023-11-28 DIAGNOSIS — S4292XD Fracture of left shoulder girdle, part unspecified, subsequent encounter for fracture with routine healing: Secondary | ICD-10-CM | POA: Diagnosis not present

## 2023-11-28 DIAGNOSIS — I110 Hypertensive heart disease with heart failure: Secondary | ICD-10-CM | POA: Diagnosis not present

## 2023-11-29 ENCOUNTER — Other Ambulatory Visit: Payer: Self-pay

## 2023-11-29 LAB — BODY FLUID CULTURE W GRAM STAIN
Culture: NO GROWTH
Gram Stain: NONE SEEN

## 2023-11-29 MED FILL — Omeprazole Cap Delayed Release 20 MG: ORAL | 60 days supply | Qty: 60 | Fill #6 | Status: AC

## 2023-11-29 MED FILL — Montelukast Sodium Tab 10 MG (Base Equiv): ORAL | 60 days supply | Qty: 60 | Fill #6 | Status: AC

## 2023-11-29 MED FILL — Tamsulosin HCl Cap 0.4 MG: ORAL | 60 days supply | Qty: 60 | Fill #6 | Status: AC

## 2023-11-30 ENCOUNTER — Other Ambulatory Visit: Payer: Self-pay

## 2023-12-01 ENCOUNTER — Other Ambulatory Visit: Payer: Self-pay

## 2023-12-01 ENCOUNTER — Telehealth: Payer: Self-pay

## 2023-12-01 DIAGNOSIS — D508 Other iron deficiency anemias: Secondary | ICD-10-CM

## 2023-12-01 DIAGNOSIS — D631 Anemia in chronic kidney disease: Secondary | ICD-10-CM

## 2023-12-01 NOTE — Telephone Encounter (Signed)
 Pt called in after having a catheter placed 2 days ago in the ED for retention. Pt wanted catheter taken out because he was going out of town. I let patient know our clinic policy on how long he needs to keep in the cath and the negative effects that could happen for taking it out to soon. Pt voiced understanding and I let him know that he could com into the clinic to get more discreet leg bags so he can attend a wedding out of town. Patient is already scheduled to have a voiding trial at the end of the month.

## 2023-12-02 ENCOUNTER — Inpatient Hospital Stay: Attending: Oncology

## 2023-12-02 ENCOUNTER — Telehealth: Payer: Self-pay

## 2023-12-02 ENCOUNTER — Other Ambulatory Visit: Payer: Self-pay

## 2023-12-02 DIAGNOSIS — D509 Iron deficiency anemia, unspecified: Secondary | ICD-10-CM | POA: Diagnosis not present

## 2023-12-02 DIAGNOSIS — Z800 Family history of malignant neoplasm of digestive organs: Secondary | ICD-10-CM | POA: Insufficient documentation

## 2023-12-02 DIAGNOSIS — Z79899 Other long term (current) drug therapy: Secondary | ICD-10-CM | POA: Diagnosis not present

## 2023-12-02 DIAGNOSIS — Z940 Kidney transplant status: Secondary | ICD-10-CM | POA: Diagnosis not present

## 2023-12-02 DIAGNOSIS — Z803 Family history of malignant neoplasm of breast: Secondary | ICD-10-CM | POA: Diagnosis not present

## 2023-12-02 DIAGNOSIS — Z87891 Personal history of nicotine dependence: Secondary | ICD-10-CM | POA: Diagnosis not present

## 2023-12-02 DIAGNOSIS — N184 Chronic kidney disease, stage 4 (severe): Secondary | ICD-10-CM | POA: Insufficient documentation

## 2023-12-02 DIAGNOSIS — D508 Other iron deficiency anemias: Secondary | ICD-10-CM

## 2023-12-02 DIAGNOSIS — D631 Anemia in chronic kidney disease: Secondary | ICD-10-CM | POA: Diagnosis not present

## 2023-12-02 LAB — SAMPLE TO BLOOD BANK

## 2023-12-02 LAB — HEMOGLOBIN AND HEMATOCRIT (CANCER CENTER ONLY)
HCT: 36.9 % — ABNORMAL LOW (ref 39.0–52.0)
Hemoglobin: 11.4 g/dL — ABNORMAL LOW (ref 13.0–17.0)

## 2023-12-02 MED ORDER — OXYCODONE-ACETAMINOPHEN 10-325 MG PO TABS
1.0000 | ORAL_TABLET | Freq: Four times a day (QID) | ORAL | 0 refills | Status: DC | PRN
  Filled 2023-12-02: qty 25, 7d supply, fill #0

## 2023-12-02 NOTE — Telephone Encounter (Signed)
 Hgb 11.4; per Dr. Melanee no blood tranfusion for tomorrow 12/03/23.  Outbound call; informed of above.  No questions / concerns at this time.

## 2023-12-03 ENCOUNTER — Inpatient Hospital Stay

## 2023-12-04 DIAGNOSIS — E1122 Type 2 diabetes mellitus with diabetic chronic kidney disease: Secondary | ICD-10-CM | POA: Diagnosis not present

## 2023-12-04 DIAGNOSIS — Z466 Encounter for fitting and adjustment of urinary device: Secondary | ICD-10-CM | POA: Diagnosis not present

## 2023-12-04 DIAGNOSIS — I5042 Chronic combined systolic (congestive) and diastolic (congestive) heart failure: Secondary | ICD-10-CM | POA: Diagnosis not present

## 2023-12-04 DIAGNOSIS — I130 Hypertensive heart and chronic kidney disease with heart failure and stage 1 through stage 4 chronic kidney disease, or unspecified chronic kidney disease: Secondary | ICD-10-CM | POA: Diagnosis not present

## 2023-12-04 DIAGNOSIS — N401 Enlarged prostate with lower urinary tract symptoms: Secondary | ICD-10-CM | POA: Diagnosis not present

## 2023-12-04 DIAGNOSIS — N184 Chronic kidney disease, stage 4 (severe): Secondary | ICD-10-CM | POA: Diagnosis not present

## 2023-12-04 DIAGNOSIS — N179 Acute kidney failure, unspecified: Secondary | ICD-10-CM | POA: Diagnosis not present

## 2023-12-04 DIAGNOSIS — R338 Other retention of urine: Secondary | ICD-10-CM | POA: Diagnosis not present

## 2023-12-04 DIAGNOSIS — S4292XD Fracture of left shoulder girdle, part unspecified, subsequent encounter for fracture with routine healing: Secondary | ICD-10-CM | POA: Diagnosis not present

## 2023-12-04 DIAGNOSIS — D631 Anemia in chronic kidney disease: Secondary | ICD-10-CM | POA: Diagnosis not present

## 2023-12-05 ENCOUNTER — Telehealth: Payer: Self-pay

## 2023-12-05 DIAGNOSIS — E1122 Type 2 diabetes mellitus with diabetic chronic kidney disease: Secondary | ICD-10-CM | POA: Diagnosis not present

## 2023-12-05 DIAGNOSIS — N401 Enlarged prostate with lower urinary tract symptoms: Secondary | ICD-10-CM | POA: Diagnosis not present

## 2023-12-05 DIAGNOSIS — R338 Other retention of urine: Secondary | ICD-10-CM | POA: Diagnosis not present

## 2023-12-05 DIAGNOSIS — M25512 Pain in left shoulder: Secondary | ICD-10-CM | POA: Diagnosis not present

## 2023-12-05 DIAGNOSIS — I5042 Chronic combined systolic (congestive) and diastolic (congestive) heart failure: Secondary | ICD-10-CM | POA: Diagnosis not present

## 2023-12-05 DIAGNOSIS — D631 Anemia in chronic kidney disease: Secondary | ICD-10-CM | POA: Diagnosis not present

## 2023-12-05 DIAGNOSIS — N179 Acute kidney failure, unspecified: Secondary | ICD-10-CM | POA: Diagnosis not present

## 2023-12-05 DIAGNOSIS — Z466 Encounter for fitting and adjustment of urinary device: Secondary | ICD-10-CM | POA: Diagnosis not present

## 2023-12-05 DIAGNOSIS — N184 Chronic kidney disease, stage 4 (severe): Secondary | ICD-10-CM | POA: Diagnosis not present

## 2023-12-05 DIAGNOSIS — I130 Hypertensive heart and chronic kidney disease with heart failure and stage 1 through stage 4 chronic kidney disease, or unspecified chronic kidney disease: Secondary | ICD-10-CM | POA: Diagnosis not present

## 2023-12-05 DIAGNOSIS — S4292XD Fracture of left shoulder girdle, part unspecified, subsequent encounter for fracture with routine healing: Secondary | ICD-10-CM | POA: Diagnosis not present

## 2023-12-05 NOTE — Telephone Encounter (Signed)
 Called Tiffany gave verbal orders will call if any questions.

## 2023-12-05 NOTE — Telephone Encounter (Signed)
 Copied from CRM #[id number]. Topic: Clinical - Home Health Verbal Orders >> Dec 05, 2023  1:26 PM Burnard DEL wrote: Caller/Agency: [facility] Callback Number: [phone]  Service Requested: Physical Therapy Frequency: 1wk1 2wk  1wk6 Any new concerns about the patient? Yes Glucose reading was 287,patient used insulin  to try and get it down.He was just released from hospital ,he has weakness and balance issues

## 2023-12-05 NOTE — Telephone Encounter (Signed)
 Agree with this. Thanks.

## 2023-12-08 ENCOUNTER — Inpatient Hospital Stay

## 2023-12-08 DIAGNOSIS — R338 Other retention of urine: Secondary | ICD-10-CM | POA: Diagnosis not present

## 2023-12-08 DIAGNOSIS — E1122 Type 2 diabetes mellitus with diabetic chronic kidney disease: Secondary | ICD-10-CM | POA: Diagnosis not present

## 2023-12-08 DIAGNOSIS — N179 Acute kidney failure, unspecified: Secondary | ICD-10-CM | POA: Diagnosis not present

## 2023-12-08 DIAGNOSIS — I130 Hypertensive heart and chronic kidney disease with heart failure and stage 1 through stage 4 chronic kidney disease, or unspecified chronic kidney disease: Secondary | ICD-10-CM | POA: Diagnosis not present

## 2023-12-08 DIAGNOSIS — Z466 Encounter for fitting and adjustment of urinary device: Secondary | ICD-10-CM | POA: Diagnosis not present

## 2023-12-08 DIAGNOSIS — D631 Anemia in chronic kidney disease: Secondary | ICD-10-CM | POA: Diagnosis not present

## 2023-12-08 DIAGNOSIS — N184 Chronic kidney disease, stage 4 (severe): Secondary | ICD-10-CM | POA: Diagnosis not present

## 2023-12-08 DIAGNOSIS — S4292XD Fracture of left shoulder girdle, part unspecified, subsequent encounter for fracture with routine healing: Secondary | ICD-10-CM | POA: Diagnosis not present

## 2023-12-08 DIAGNOSIS — I5042 Chronic combined systolic (congestive) and diastolic (congestive) heart failure: Secondary | ICD-10-CM | POA: Diagnosis not present

## 2023-12-08 DIAGNOSIS — N401 Enlarged prostate with lower urinary tract symptoms: Secondary | ICD-10-CM | POA: Diagnosis not present

## 2023-12-09 ENCOUNTER — Encounter: Payer: Self-pay | Admitting: Family Medicine

## 2023-12-09 ENCOUNTER — Ambulatory Visit: Admitting: Family Medicine

## 2023-12-09 VITALS — BP 138/88 | HR 77 | Temp 97.9°F | Ht 69.0 in | Wt 244.0 lb

## 2023-12-09 DIAGNOSIS — J45998 Other asthma: Secondary | ICD-10-CM

## 2023-12-09 DIAGNOSIS — N1832 Chronic kidney disease, stage 3b: Secondary | ICD-10-CM | POA: Diagnosis not present

## 2023-12-09 DIAGNOSIS — R310 Gross hematuria: Secondary | ICD-10-CM

## 2023-12-09 DIAGNOSIS — I872 Venous insufficiency (chronic) (peripheral): Secondary | ICD-10-CM

## 2023-12-09 DIAGNOSIS — I482 Chronic atrial fibrillation, unspecified: Secondary | ICD-10-CM

## 2023-12-09 DIAGNOSIS — D696 Thrombocytopenia, unspecified: Secondary | ICD-10-CM

## 2023-12-09 DIAGNOSIS — L97901 Non-pressure chronic ulcer of unspecified part of unspecified lower leg limited to breakdown of skin: Secondary | ICD-10-CM | POA: Diagnosis not present

## 2023-12-09 DIAGNOSIS — I5022 Chronic systolic (congestive) heart failure: Secondary | ICD-10-CM

## 2023-12-09 DIAGNOSIS — Z23 Encounter for immunization: Secondary | ICD-10-CM

## 2023-12-09 DIAGNOSIS — D849 Immunodeficiency, unspecified: Secondary | ICD-10-CM

## 2023-12-09 DIAGNOSIS — N400 Enlarged prostate without lower urinary tract symptoms: Secondary | ICD-10-CM

## 2023-12-09 DIAGNOSIS — K219 Gastro-esophageal reflux disease without esophagitis: Secondary | ICD-10-CM

## 2023-12-09 DIAGNOSIS — E785 Hyperlipidemia, unspecified: Secondary | ICD-10-CM | POA: Diagnosis not present

## 2023-12-09 DIAGNOSIS — N184 Chronic kidney disease, stage 4 (severe): Secondary | ICD-10-CM

## 2023-12-09 DIAGNOSIS — M064 Inflammatory polyarthropathy: Secondary | ICD-10-CM

## 2023-12-09 DIAGNOSIS — E1169 Type 2 diabetes mellitus with other specified complication: Secondary | ICD-10-CM

## 2023-12-09 DIAGNOSIS — M1A09X1 Idiopathic chronic gout, multiple sites, with tophus (tophi): Secondary | ICD-10-CM

## 2023-12-09 DIAGNOSIS — E1122 Type 2 diabetes mellitus with diabetic chronic kidney disease: Secondary | ICD-10-CM | POA: Diagnosis not present

## 2023-12-09 DIAGNOSIS — F39 Unspecified mood [affective] disorder: Secondary | ICD-10-CM

## 2023-12-09 DIAGNOSIS — I10 Essential (primary) hypertension: Secondary | ICD-10-CM | POA: Diagnosis not present

## 2023-12-09 DIAGNOSIS — Z7985 Long-term (current) use of injectable non-insulin antidiabetic drugs: Secondary | ICD-10-CM

## 2023-12-09 DIAGNOSIS — S4292XD Fracture of left shoulder girdle, part unspecified, subsequent encounter for fracture with routine healing: Secondary | ICD-10-CM

## 2023-12-09 DIAGNOSIS — S83242A Other tear of medial meniscus, current injury, left knee, initial encounter: Secondary | ICD-10-CM

## 2023-12-09 DIAGNOSIS — Z940 Kidney transplant status: Secondary | ICD-10-CM

## 2023-12-09 DIAGNOSIS — D631 Anemia in chronic kidney disease: Secondary | ICD-10-CM | POA: Diagnosis not present

## 2023-12-09 DIAGNOSIS — E291 Testicular hypofunction: Secondary | ICD-10-CM

## 2023-12-09 DIAGNOSIS — Z794 Long term (current) use of insulin: Secondary | ICD-10-CM

## 2023-12-09 DIAGNOSIS — Z9884 Bariatric surgery status: Secondary | ICD-10-CM

## 2023-12-09 DIAGNOSIS — Z7984 Long term (current) use of oral hypoglycemic drugs: Secondary | ICD-10-CM

## 2023-12-09 DIAGNOSIS — G4733 Obstructive sleep apnea (adult) (pediatric): Secondary | ICD-10-CM

## 2023-12-09 LAB — CBC WITH DIFFERENTIAL/PLATELET
Basophils Absolute: 0 K/uL (ref 0.0–0.1)
Basophils Relative: 0.6 % (ref 0.0–3.0)
Eosinophils Absolute: 0 K/uL (ref 0.0–0.7)
Eosinophils Relative: 0.4 % (ref 0.0–5.0)
HCT: 33 % — ABNORMAL LOW (ref 39.0–52.0)
Hemoglobin: 10.7 g/dL — ABNORMAL LOW (ref 13.0–17.0)
Lymphocytes Relative: 8.6 % — ABNORMAL LOW (ref 12.0–46.0)
Lymphs Abs: 0.3 K/uL — ABNORMAL LOW (ref 0.7–4.0)
MCHC: 32.3 g/dL (ref 30.0–36.0)
MCV: 87.9 fl (ref 78.0–100.0)
Monocytes Absolute: 0.2 K/uL (ref 0.1–1.0)
Monocytes Relative: 5.1 % (ref 3.0–12.0)
Neutro Abs: 3.4 K/uL (ref 1.4–7.7)
Neutrophils Relative %: 85.3 % — ABNORMAL HIGH (ref 43.0–77.0)
Platelets: 91 K/uL — ABNORMAL LOW (ref 150.0–400.0)
RBC: 3.76 Mil/uL — ABNORMAL LOW (ref 4.22–5.81)
RDW: 16.6 % — ABNORMAL HIGH (ref 11.5–15.5)
WBC: 4 K/uL (ref 4.0–10.5)

## 2023-12-09 NOTE — Progress Notes (Unsigned)
 Electrophysiology Office Note:    Date:  12/10/2023   ID:  Carl Poli., DOB 27-Dec-1960, MRN [id number]  [facility] Cardiologist:  Deatrice Cage, MD  [facility] Electrophysiologist:  OLE ONEIDA HOLTS, MD   Referring MD: [name]*   Chief Complaint: Atrial fibrillation  History of Present Illness:    [name] is a 63 year old man who I am seeing today for an evaluation of atrial fibrillation.  He has an extensive past medical history that includes coronary artery disease with prior CABG in 2005, chronic systolic heart failure, hypertension, kidney transplant in 2012, gastric bypass, diabetes and pancytopenia.  The patient has been admitted for mechanical falls and epistaxis.  This is required stopping his anticoagulant and packing by ENT of the nares.  He is doing well.  He confirms that he has had multiple falls with personal injury.  1 resulted in a fractured nose with bleeding that required packing with ENT.       Their past medical, social and family history was reviewed.   ROS:   Please see the history of present illness.    All other systems reviewed and are negative.  EKGs/Labs/Other Studies Reviewed:    The following studies were reviewed today:  September 27, 2023 echo EF 40-45 RV poorly visualized Dilated left and right atrium No MR       Physical Exam:    VS:  BP (!) 140/68 (BP Location: Left Arm, Patient Position: Sitting, Cuff Size: Large)   Pulse 69   Ht 5' 9.5 (1.765 m)   Wt 243 lb (110.2 kg)   SpO2 98%   BMI 35.37 kg/m     Wt Readings from Last 3 Encounters:  12/10/23 243 lb (110.2 kg)  12/09/23 244 lb (110.7 kg)  11/22/23 236 lb 15.9 oz (107.5 kg)     GEN: no distress CARD: RRR, No MRG RESP: No IWOB. CTAB.        ASSESSMENT AND PLAN:    1. Chronic atrial fibrillation (HCC)     #Atrial fibrillation #Falls #Epistaxis The patient is referred to discuss left atrial appendage occlusion given his  inability to tolerate indefinite anticoagulation.  I have seen Carl Swayze Pries. in the office today who is being considered for a Watchman left atrial appendage closure device. I believe they will benefit from this procedure given their history of atrial fibrillation, CHA2DS2-VASc score of 4 and unadjusted ischemic stroke rate of 4.8% per year. Unfortunately, the patient is not felt to be a long term anticoagulation candidate secondary to epistaxis and recurrent falls. The patient's chart has been reviewed and I feel that they would be a candidate for short term oral anticoagulation after Watchman implant.   It is my belief that after undergoing a LAA closure procedure, Carl Anshul Meddings. will not need long term anticoagulation which eliminates anticoagulation side effects and major bleeding risk.   Procedural risks for the Watchman implant have been reviewed with the patient including a 0.5% risk of stroke, <1% risk of perforation and <1% risk of device embolization. Other risks include bleeding, vascular damage, tamponade, worsening renal function, and death. The patient understands these risk and wishes to proceed.     The published clinical data on the safety and effectiveness of WATCHMAN include but are not limited to the following: - Holmes DR, Jess BEARD, Sick P et al. for the PROTECT AF Investigators. Percutaneous closure of the left atrial appendage versus warfarin therapy for prevention of  stroke in patients with atrial fibrillation: a randomised non-inferiority trial. Lancet 2009; 374: 534-42. GLENWOOD Jess BEARD, Doshi SK, Jonita VEAR Satchel D et al. on behalf of the PROTECT AF Investigators. Percutaneous Left Atrial Appendage Closure for Stroke Prophylaxis in Patients With Atrial Fibrillation 2.3-Year Follow-up of the PROTECT AF (Watchman Left Atrial Appendage System for Embolic Protection in Patients With Atrial Fibrillation) Trial. Circulation 2013; 127:720-729. - Alli O, Doshi S,  Kar S,  Reddy VY, Sievert H et al. Quality of Life Assessment in the Randomized PROTECT AF (Percutaneous Closure of the Left Atrial Appendage Versus Warfarin Therapy for Prevention of Stroke in Patients With Atrial Fibrillation) Trial of Patients at Risk for Stroke With Nonvalvular Atrial Fibrillation. J Am Coll Cardiol 2013; 61:1790-8. GLENWOOD Satchel DR, Archer RAMAN, Price M, Whisenant B, Sievert H, Doshi S, Huber K, Reddy V. Prospective randomized evaluation of the Watchman left atrial appendage Device in patients with atrial fibrillation versus long-term warfarin therapy; the PREVAIL trial. Journal of the [employer] of Cardiology, Vol. 4, No. 1, 2014, 1-11. - Kar S, Doshi SK, Sadhu A, Horton R, Osorio J et al. Primary outcome evaluation of a next-generation left atrial appendage closure device: results from the PINNACLE FLX trial. Circulation 2021;143(18)1754-1762.    After today's visit with the patient which was dedicated solely for shared decision making visit regarding LAA closure device, the patient decided to proceed with the LAA appendage closure procedure scheduled to be done in the near future at [facility].   HAS-BLED score 3 Hypertension Yes  Abnormal renal and liver function (Dialysis, transplant, Cr >2.26 mg/dL /Cirrhosis or Bilirubin >2x Normal or AST/ALT/AP >3x Normal) Yes  Stroke No  Bleeding Yes  Labile INR (Unstable/high INR) No  Elderly (>65) No  Drugs or alcohol (>= 8 drinks/week, anti-plt or NSAID) No   CHA2DS2-VASc Score = 4  The patient's score is based upon: CHF History: 1 HTN History: 1 Diabetes History: 1 Stroke History: 0 Vascular Disease History: 1 Age Score: 0 Gender Score: 0  He is planning to speak with his nephrologist about the risks of contrast media given his abnormal kidney function.  He will reach out if he wants to proceed.  We will schedule a backstop appointment for January 2026.  Signed, Ole DASEN. Cindie, MD, [name], [facility] 12/10/2023 11:34 AM     Electrophysiology  [facility]

## 2023-12-09 NOTE — Progress Notes (Addendum)
 Ph: [phone] Fax: [phone]   Patient ID: [name]., male    DOB: 31-Jan-1961, 63 y.o.   MRN: [id number]  This visit was conducted in person.  BP 138/88   Pulse 77   Temp 97.9 F (36.6 C) (Oral)   Ht 5' 9 (1.753 m)   Wt 244 lb (110.7 kg)   SpO2 96%   BMI 36.03 kg/m    CC: transfer care visit , hospital follow up Subjective:   HPI: Carl Naseem Varden. is a 63 y.o. male presenting on 12/09/2023 for Transitions Of Care (Pt needs a referral to the wound center )   Previously saw Dr Jimmy who has retired. Last physical was 11/04/2023.  [place].  H/o ESRD s/p renal transplant 2012 (transplant team at [facility] Dr Joshua) on immunosuppression (prednisone , mycophenolate , tacrolimus ), HFmrEF 40-45% 09/2023 on torsemide , afib on eliquis , CAD s/p CABG 2005, obesity and OSA on CPAP.   Initial fall 09/06/2023 - tripped going down stairs in fishing tournament, broke left shoulder. Again fell 09/2023 going down stairs. He had been doing rehab.  Recent colonoscopy complicated by urinary retention s/p foley catheter placement.   Recent hospitalization for generalized weakness presenting with fall at home - slid off recliner complicated by acute on chronic kidney injury and sepsis of unknown etiology, treated with empiric antibiotics. Foley catheter was placed due to urinary retention attributed to BPH despite flomax .  Hospital records reviewed. Med rec performed.  Held meds: losartan  25mg , metolazone  2.5mg , potassium , bactrim , testosterone , tizanidine  2mg , torsemide  100mg .  Hypotensive on presentation with Cr up to 3.87 (baseline around 2). Treated with IVF rehydration.  MRI L knee showed medial meniscal tear with mod-large joint effusion. Received steroid injection in hospital by orthopedics.  Gross hematuria - due to trauma of foley catheter. Eliquis  was held with resolution. Discharged with foley catheter with bag, this  was removed on 12/02/2023 by general surgeon. Has uro f/u scheduled 12/22/2023.   Rectal bleed after recent condyloma ablation and hemorrhoid ligation/ hemorrhoidopexy /hemorrhoidectomy by Dr Sheldon 11/20/2023.   Known OSA on CPAP.  Known gout and rheumatoid arthritis on uloric  120mg  daily and colchicine  every other day scheduled, managed by Rheum Dr Tobie. Not on DMARD due to kidney transplant/ immunosuppressed status.   DM - on semglee  14u daily, mounjaro  5mg  weekly, farxiga  10mg  daily. Fasting cbg this morning 96. No recent low sugars. Sees Dr Damian endocrinology.  Lab Results  Component Value Date   HGBA1C 5.5 11/06/2023   Requests referral to wound clinic - has several poorly healing wounds. Present for 3-4 wks. Treating with silver  sulfadiazene.   He states he's using toresmide, potassium metolazone  as needed for leg swelling, weight gain. He weighs regularly.   He is currently taking oxycodone  PRN pain - taking about 3-4x/wk He states he continues testosterone  topical replacement.  He is off bactrim  - will check with nephrology prior to restarting.   He feels he's voiding well, on flomax .  Since home overall feeling well.  No fevers/chills, dysuria, abd pain, chest pain or shortness of breath.   Home health - set up with [facility].  Other follow up appointments scheduled: gen surg Dr Sheldon 9/22, heme/onc Dr Melanee 9/22, EP Dr Cindie tomorrow, nephrology team Friday, endo Dr Damian Friday, CHF Dr Rolan 9/19 and urology Sam Vaillancourt PA 9/29.  ______________________________________________________________________ Hospital admission: 11/22/2023 Hospital discharge: 11/27/2023 TCM f/u phone call: not performed   Recommendations at discharge:  Patient will be followed with PCP in 1 week. Follow-up with nephrology as soon as possible. Follow-up with urology to be seen in 1 week, possible removal of Foley catheter. Hold the Bactrim  until seen by PCP or nephrology. Check a BMP at next  office visit. Hold off Eliquis , restart in 3 days. All above advice has been communicated with patient, he voiced understanding.   Discharge Diagnoses: Active Problems:   CAD s/p CABG 2005   Chronic HFrEF (heart failure with reduced ejection fraction) (HCC)   Hypertension   Hyperlipidemia   Renal transplant recipient, 2012   Ischemic cardiomyopathy   OSA (obstructive sleep apnea)   H/O gastric bypass   Type II diabetes mellitus with renal manifestations (HCC)   Immunosuppressed status (HCC)   Acute renal failure superimposed on stage 3b chronic kidney disease (HCC)   Sepsis (HCC) Chronic pancytopenia.     Relevant past medical, surgical, family and social history reviewed and updated as indicated. Interim medical history since our last visit reviewed. Allergies and medications reviewed and updated. Outpatient Medications Prior to Visit  Medication Sig Dispense Refill   albuterol  (PROVENTIL ) (2.5 MG/3ML) 0.083% nebulizer solution Take 3 mLs (2.5 mg total) by nebulization every 6 (six) hours as needed for wheezing or shortness of breath. 150 mL 0   albuterol  (VENTOLIN  HFA) 108 (90 Base) MCG/ACT inhaler Inhale 2 puffs into the lungs every 6 (six) hours as needed for wheezing or shortness of breath. 6.7 g 5   apixaban  (ELIQUIS ) 5 MG TABS tablet Take 1 tablet (5 mg total) by mouth 2 (two) times daily.     buPROPion  (WELLBUTRIN  SR) 150 MG 12 hr tablet Take 1 tablet (150 mg total) by mouth 2 (two) times daily. 180 tablet 3   colchicine  0.6 MG tablet Take 0.6 mg by mouth every other day.     Cyanocobalamin  (B-12 PO) Take 1 tablet by mouth daily.     diazepam  (VALIUM ) 5 MG tablet Take 1 tablet (5 mg total) by mouth every 8 (eight) hours as needed for muscle spasms (difficulty urinating). 6 tablet 1   febuxostat  (ULORIC ) 40 MG tablet Take 3 tablets (120 mg total) by mouth daily. 90 tablet 5   fluticasone  (FLONASE ) 50 MCG/ACT nasal spray Place 2 sprays into both nostrils daily. (Patient taking  differently: Place 2 sprays into both nostrils daily as needed for allergies or rhinitis.) 16 g 11   gabapentin  (NEURONTIN ) 300 MG capsule Take 1 capsule (300 mg total) by mouth 2 (two) times daily. 180 capsule 3   insulin  glargine-yfgn (SEMGLEE ) 100 UNIT/ML Pen Inject 20 Units into the skin daily. (Patient taking differently: Inject 14 Units into the skin daily.) 45 mL 3   Insulin  Pen Needle (TECHLITE PEN NEEDLES) 31G X 5 MM MISC 4 (four) times daily. 400 each 3   Insulin  Pen Needle (UNIFINE PENTIPS) 31G X 5 MM MISC Use 4 times daily as directed 400 each 3   montelukast  (SINGULAIR ) 10 MG tablet Take 1 tablet (10 mg total) by mouth at bedtime. 30 tablet 11   mycophenolate  (MYFORTIC ) 180 MG EC tablet Take 2 tablets (360 mg total) by mouth every morning AND 1 tablet (180 mg total) every evening. 360 tablet 3   omeprazole  (PRILOSEC) 20 MG capsule Take 1 capsule (20 mg total) by mouth daily. 30 capsule 11   oxyCODONE -acetaminophen  (PERCOCET) 10-325 MG tablet Take 1 tablet by mouth every 6 (six) hours as needed for up to 5 days. 25 tablet 0   predniSONE  (  DELTASONE ) 5 MG tablet Take 1 tablet (5 mg total) by mouth daily. 90 tablet 3   rosuvastatin  (CRESTOR ) 10 MG tablet Take 1 tablet (10 mg total) by mouth daily. 90 tablet 3   sodium chloride  (OCEAN) 0.65 % SOLN nasal spray Place 2 sprays into both nostrils every 8 (eight) hours for 10 days. (Patient taking differently: Place 2 sprays into both nostrils as needed for congestion.)     tadalafil  (CIALIS ) 5 MG tablet Take 5 mg by mouth daily as needed for erectile dysfunction.     tamsulosin  (FLOMAX ) 0.4 MG CAPS capsule Take 1 capsule (0.4 mg total) by mouth daily. 30 capsule 11   Zinc  50 MG TABS Take 1 tablet (50 mg total) by mouth daily. 100 tablet 3   Continuous Glucose Sensor (FREESTYLE LIBRE 3 SENSOR) MISC Use one every 2 weeks. 6 each 3   dapagliflozin  propanediol (FARXIGA ) 10 MG TABS tablet Take 1 tablet (10 mg total) by mouth daily before breakfast. 90  tablet 3   tacrolimus  (PROGRAF ) 1 MG capsule Take 2 capsules (2 mg total) by mouth every morning AND 1 capsule (1 mg total) every evening. 90 capsule 11   tirzepatide  (MOUNJARO ) 5 MG/0.5ML Pen Inject 5 mg into the skin once a week. 2 mL 5   No facility-administered medications prior to visit.     Per HPI unless specifically indicated in ROS section below Review of Systems  Objective:  BP 138/88   Pulse 77   Temp 97.9 F (36.6 C) (Oral)   Ht 5' 9 (1.753 m)   Wt 244 lb (110.7 kg)   SpO2 96%   BMI 36.03 kg/m   Wt Readings from Last 3 Encounters:  01/08/24 237 lb (107.5 kg)  12/22/23 246 lb 14.4 oz (112 kg)  12/15/23 244 lb 1.6 oz (110.7 kg)      Physical Exam Vitals and nursing note reviewed.  Constitutional:      Appearance: Normal appearance. He is not ill-appearing.     Comments: Ambulates unassisted  HENT:     Head: Normocephalic and atraumatic.     Mouth/Throat:     Mouth: Mucous membranes are moist.     Pharynx: Oropharynx is clear. No oropharyngeal exudate or posterior oropharyngeal erythema.  Eyes:     Extraocular Movements: Extraocular movements intact.     Conjunctiva/sclera: Conjunctivae normal.     Pupils: Pupils are equal, round, and reactive to light.  Cardiovascular:     Rate and Rhythm: Normal rate. Rhythm irregular.     Pulses: Normal pulses.     Heart sounds: Normal heart sounds. No murmur heard. Pulmonary:     Effort: Pulmonary effort is normal. No respiratory distress.     Breath sounds: Normal breath sounds. No wheezing, rhonchi or rales.  Musculoskeletal:        General: Normal range of motion.     Right lower leg: Edema present.     Left lower leg: Edema present.     Comments: 1+ DP bilaterally  Skin:    General: Skin is warm and dry.     Findings: Wound present.     Comments: Lichenification to BLE with shallow weeping wounds anterior legs bilaterally without surrounding erythema   Neurological:     Mental Status: He is alert.   Psychiatric:        Mood and Affect: Mood normal.        Behavior: Behavior normal.       Results for orders placed or  performed in visit on 12/09/23  Basic metabolic panel with GFR   Collection Time: 12/09/23 10:40 AM  Result Value Ref Range   Sodium 137 135 - 145 mEq/L   Potassium 3.5 3.5 - 5.1 mEq/L   Chloride 108 96 - 112 mEq/L   CO2 28 19 - 32 mEq/L   Glucose, Bld 130 (H) 70 - 99 mg/dL   BUN 71 (H) 6 - 23 mg/dL   Creatinine, Ser 8.06 (H) 0.40 - 1.50 mg/dL   GFR 63.79 (L) >39.99 mL/min   Calcium  9.1 8.4 - 10.5 mg/dL  CBC with Differential/Platelet   Collection Time: 12/09/23 10:40 AM  Result Value Ref Range   WBC 4.0 4.0 - 10.5 K/uL   RBC 3.76 (L) 4.22 - 5.81 Mil/uL   Hemoglobin 10.7 (L) 13.0 - 17.0 g/dL   HCT 66.9 (L) 60.9 - 47.9 %   MCV 87.9 78.0 - 100.0 fl   MCHC 32.3 30.0 - 36.0 g/dL   RDW 83.3 (H) 88.4 - 84.4 %   Platelets 91.0 (L) 150.0 - 400.0 K/uL   Neutrophils Relative % 85.3 Repeated and verified X2. (H) 43.0 - 77.0 %   Lymphocytes Relative 8.6 (L) 12.0 - 46.0 %   Monocytes Relative 5.1 3.0 - 12.0 %   Eosinophils Relative 0.4 0.0 - 5.0 %   Basophils Relative 0.6 0.0 - 3.0 %   Neutro Abs 3.4 1.4 - 7.7 K/uL   Lymphs Abs 0.3 (L) 0.7 - 4.0 K/uL   Monocytes Absolute 0.2 0.1 - 1.0 K/uL   Eosinophils Absolute 0.0 0.0 - 0.7 K/uL   Basophils Absolute 0.0 0.0 - 0.1 K/uL   *Note: Due to a large number of results and/or encounters for the requested time period, some results have not been displayed. A complete set of results can be found in Results Review.   Lab Results  Component Value Date   TSH 2.476 11/12/2023    Lab Results  Component Value Date   ALT 14 11/23/2023   AST 14 (L) 11/23/2023   ALKPHOS 126 11/23/2023   BILITOT 0.8 11/23/2023   Lab Results  Component Value Date   CKTOTAL 38 (L) 06/21/2022   Lab Results  Component Value Date   LABURIC 16.1 (H) 09/18/2021   Lab Results  Component Value Date   IRON  30 (L) 12/15/2023   TIBC 202 (L)  12/15/2023   FERRITIN 167 12/15/2023       12/15/2023    1:59 PM 12/09/2023    9:50 AM 11/12/2023    2:00 PM 11/05/2023    2:25 PM 11/04/2023    9:00 AM  Depression screen PHQ 2/9  Decreased Interest 0 0 0 0 0  Down, Depressed, Hopeless 0 0 0 0 0  PHQ - 2 Score 0 0 0 0 0  Altered sleeping 0 0 0 0   Tired, decreased energy 0 0 0 0   Change in appetite 0 0 0 0   Feeling bad or failure about yourself  0 0 0 0   Trouble concentrating 0 0 0 0   Moving slowly or fidgety/restless 0 0 0 0   Suicidal thoughts 0 0     PHQ-9 Score 0 0 0 0   Difficult doing work/chores  Not difficult at all          12/09/2023    9:50 AM 08/06/2022   12:04 PM 07/19/2022    3:29 PM  GAD 7 : Generalized Anxiety Score  Nervous, Anxious, on  Edge 0 1 0  Control/stop worrying 0 0 0  Worry too much - different things 1 1 0  Trouble relaxing 0 1 0  Restless 0 1 0  Easily annoyed or irritable 0 0 0  Afraid - awful might happen 0 0 0  Total GAD 7 Score 1 4 0  Anxiety Difficulty Not difficult at all Not difficult at all Not difficult at all   Assessment & Plan:  I personally spent a total of 45 minutes in the care of the patient today including preparing to see the patient, getting/reviewing separately obtained history, performing a medically appropriate exam/evaluation, placing orders, referring and communicating with other health care professionals, documenting clinical information in the EHR, coordinating care, and performing wound care to lower extremity ulcers.   Problem List Items Addressed This Visit     Hyperlipidemia associated with type 2 diabetes mellitus (HCC)   Continue crestor .       Gout   Gout with tophi. Followed by rheumatology Mathias) on uloric  and PRN colchicine .      OSA (obstructive sleep apnea)   Chronic on CPAP.      Asthma, persistent controlled   Continue singulair  with PRN albuterol . Consider Airsupra.       Renal transplant, status post   Essential hypertension   Chronic,  BP stable on current regimen, losartan  currently on hold. See below.       Immunosuppressed status   On prednisone  5mg  daily, mycophenolate  180/360mg  bid, tacrolimus  2/1mg  bid, followed by transplant team.       BPH (benign prostatic hyperplasia)   Recent urinary retention despite flomax  after hemorrhoid surgery.  Foley has since been removed and he is voiding well.  Has uro f/u next week.       Hypogonadism in male   Is being followed by urology, discussing topical testosterone  replacement      Chronic venous insufficiency   Contributes to LE ulcer disease.      Chronic heart failure with mildly reduced ejection fraction (HFmrEF, 41-49%) (HCC)   Latest EF 40-45% (09/2023).  He states he was using torsemide  and metolazone  PRN - currently on hold after recent hospitalization for acute on chronic renal failure. Will update labs then consider restarting diuretic.  He has renal team f/u scheduled for end of the week.       GERD (gastroesophageal reflux disease)   Managed with low dose omeprazole  20mg        Mood disorder   Chronic, stable period on wellbutrin  150mg  SR BID.       Chronic atrial fibrillation (HCC)   Continue eliquis , not on BB.       Type II diabetes mellitus with renal manifestations (HCC)   Followed by endo Dr Damian on farxiga , mounjaro  5mg  weekly, semglee  basal insulin .       Severe obesity (BMI 35.0-39.9) with comorbidity (HCC)   Obesity complicated by comorobidites of HTN, HLD, OSA, CKD, CAD, CHF, afib, gout, OA, chronic pain and mood disorder.       H/O gastric bypass   S/p gastric bypass (Roux-en-Y 2015)      Anemia due to stage 3b chronic kidney disease (HCC)   Update CBC.       Relevant Orders   CBC with Differential/Platelet (Completed)   Acute renal failure superimposed on stage 3b chronic kidney disease (HCC)   In setting of recent falls and likely dehydration.  He was treated in hospital with improvement.  Several meds continue to be  held - losartan , torsemide  (PRN), metolazone  (PRN), potassium (PRN), bactrim , testosterone , tizanidine .  Recommend continue holding until renal transplant f/u scheduled 12/12/2023. Update labs today.       Relevant Orders   Basic metabolic panel with GFR (Completed)   Thrombocytopenia   Update CBC       Fracture of left shoulder   Suffered after fall 08/2023, followed by [facility] [place] Sibyl).       Ulcer of lower extremity, limited to breakdown of skin (HCC) - Primary   Wounds evaluated - chronic venous insufficiency and PAD contribute.  Wounds dressed with triple abx ointment and abdominal pads covered by fluffy kerlix dressing then ACE wraps. Reviewed home wound care, rec daily change with antibiotic ointment. Will also refer to [facility] wound clinic - last seen 12/2022.  Unna boot use considered given palpable pedal pulses however will not use in PAD hx.  ABIs from 12/2022 reviewed - BLE with noncompressible ABIs.       Relevant Orders   Ambulatory referral to Wound Clinic   Inflammatory polyarthropathy [facility])   Sees rheumatology for gout and rheumatoid arthritis, off DMARD due to immunosuppressed status (renal transplant)      Acute meniscal tear, medial, left, initial encounter   After several recent falls. Treated by ortho in hospital with steroid injection with benefit.  Rec ortho f/u as needed. He sees Dr Marchia at [facility] in [place]      Gross hematuria   Traumatic due to foley catheter placement after urinary retention after colonoscopy. This did resolve prior to discharge.       Other Visit Diagnoses       Encounter for immunization       Relevant Orders   Flu vaccine trivalent PF, 6mos and older(Flulaval ,Afluria,Fluarix,Fluzone) (Completed)     Long-term (current) use of injectable non-insulin  antidiabetic drugs         Long term current use of oral hypoglycemic drug            No orders of the defined types were placed in this  encounter.   Orders Placed This Encounter  Procedures   Flu vaccine trivalent PF, 6mos and older(Flulaval ,Afluria,Fluarix,Fluzone)   Basic metabolic panel with GFR   CBC with Differential/Platelet   Ambulatory referral to Wound Clinic    Referral Priority:   Routine    Referral Type:   Consultation    Referral Reason:   Specialty Services Required    Requested Specialty:   Wound Care    Number of Visits Requested:   1    Patient Instructions  Flu shot today  Labs today  I will refer you to wound clinic.  Continue daily dressing changes for wounds.  Return in 6 weeks for follow up visit.   Follow up plan: Return in about 6 weeks (around 01/20/2024) for follow up visit.  [name] Blas, MD

## 2023-12-09 NOTE — Patient Instructions (Addendum)
 Flu shot today  Labs today  I will refer you to wound clinic.  Continue daily dressing changes for wounds.  Return in 6 weeks for follow up visit.

## 2023-12-10 ENCOUNTER — Ambulatory Visit: Attending: Cardiology | Admitting: Cardiology

## 2023-12-10 ENCOUNTER — Encounter: Payer: Self-pay | Admitting: Cardiology

## 2023-12-10 VITALS — BP 140/68 | HR 69 | Ht 69.5 in | Wt 243.0 lb

## 2023-12-10 DIAGNOSIS — I482 Chronic atrial fibrillation, unspecified: Secondary | ICD-10-CM

## 2023-12-10 NOTE — Patient Instructions (Signed)
 Medication Instructions:  Your physician recommends that you continue on your current medications as directed. Please refer to the Current Medication list given to you today.  *If you need a refill on your cardiac medications before your next appointment, please call your pharmacy*   Follow-Up: At [facility], you and your health needs are our priority.  As part of our continuing mission to provide you with exceptional heart care, our providers are all part of one team.  This team includes your primary Cardiologist (physician) and Advanced Practice Providers or APPs (Physician Assistants and Nurse Practitioners) who all work together to provide you with the care you need, when you need it.  Your next appointment:   4 months  Provider:   Harvie Liner, MD

## 2023-12-11 ENCOUNTER — Ambulatory Visit: Payer: Self-pay | Admitting: Family Medicine

## 2023-12-11 ENCOUNTER — Encounter: Payer: Self-pay | Admitting: Family Medicine

## 2023-12-11 DIAGNOSIS — R310 Gross hematuria: Secondary | ICD-10-CM | POA: Insufficient documentation

## 2023-12-11 DIAGNOSIS — I5042 Chronic combined systolic (congestive) and diastolic (congestive) heart failure: Secondary | ICD-10-CM | POA: Diagnosis not present

## 2023-12-11 DIAGNOSIS — E1122 Type 2 diabetes mellitus with diabetic chronic kidney disease: Secondary | ICD-10-CM | POA: Diagnosis not present

## 2023-12-11 DIAGNOSIS — S83242A Other tear of medial meniscus, current injury, left knee, initial encounter: Secondary | ICD-10-CM | POA: Insufficient documentation

## 2023-12-11 DIAGNOSIS — I130 Hypertensive heart and chronic kidney disease with heart failure and stage 1 through stage 4 chronic kidney disease, or unspecified chronic kidney disease: Secondary | ICD-10-CM | POA: Diagnosis not present

## 2023-12-11 DIAGNOSIS — N184 Chronic kidney disease, stage 4 (severe): Secondary | ICD-10-CM | POA: Diagnosis not present

## 2023-12-11 DIAGNOSIS — N401 Enlarged prostate with lower urinary tract symptoms: Secondary | ICD-10-CM | POA: Diagnosis not present

## 2023-12-11 DIAGNOSIS — M064 Inflammatory polyarthropathy: Secondary | ICD-10-CM | POA: Insufficient documentation

## 2023-12-11 DIAGNOSIS — D631 Anemia in chronic kidney disease: Secondary | ICD-10-CM | POA: Diagnosis not present

## 2023-12-11 DIAGNOSIS — L97901 Non-pressure chronic ulcer of unspecified part of unspecified lower leg limited to breakdown of skin: Secondary | ICD-10-CM | POA: Insufficient documentation

## 2023-12-11 DIAGNOSIS — Z466 Encounter for fitting and adjustment of urinary device: Secondary | ICD-10-CM | POA: Diagnosis not present

## 2023-12-11 DIAGNOSIS — S4292XD Fracture of left shoulder girdle, part unspecified, subsequent encounter for fracture with routine healing: Secondary | ICD-10-CM | POA: Diagnosis not present

## 2023-12-11 DIAGNOSIS — R338 Other retention of urine: Secondary | ICD-10-CM | POA: Diagnosis not present

## 2023-12-11 DIAGNOSIS — N179 Acute kidney failure, unspecified: Secondary | ICD-10-CM | POA: Diagnosis not present

## 2023-12-11 NOTE — Assessment & Plan Note (Addendum)
 Followed by endo Dr Damian on farxiga , mounjaro  5mg  weekly, semglee  basal insulin .

## 2023-12-11 NOTE — Assessment & Plan Note (Signed)
Chronic on CPAP 

## 2023-12-11 NOTE — Assessment & Plan Note (Addendum)
 Chronic, BP stable on current regimen, losartan  currently on hold. See below.

## 2023-12-11 NOTE — Assessment & Plan Note (Addendum)
 Gout with tophi. Followed by rheumatology Mathias) on uloric  and PRN colchicine .

## 2023-12-11 NOTE — Assessment & Plan Note (Signed)
 Is being followed by urology, discussing topical testosterone  replacement

## 2023-12-11 NOTE — Assessment & Plan Note (Signed)
 Update CBC.

## 2023-12-11 NOTE — Assessment & Plan Note (Signed)
 Managed with low dose omeprazole  20mg 

## 2023-12-11 NOTE — Assessment & Plan Note (Addendum)
 Update CBC.

## 2023-12-11 NOTE — Assessment & Plan Note (Signed)
 Sees rheumatology for gout and rheumatoid arthritis, off DMARD due to immunosuppressed status (renal transplant)

## 2023-12-11 NOTE — Assessment & Plan Note (Signed)
 Traumatic due to foley catheter placement after urinary retention after colonoscopy. This did resolve prior to discharge.

## 2023-12-11 NOTE — Assessment & Plan Note (Addendum)
 On prednisone  5mg  daily, mycophenolate  180/360mg  bid, tacrolimus  2/1mg  bid, followed by transplant team.

## 2023-12-11 NOTE — Assessment & Plan Note (Addendum)
 Wounds evaluated - chronic venous insufficiency and PAD contribute.  Wounds dressed with triple abx ointment and abdominal pads covered by fluffy kerlix dressing then ACE wraps. Reviewed home wound care, rec daily change with antibiotic ointment. Will also refer to [facility] wound clinic - last seen 12/2022.  Unna boot use considered given palpable pedal pulses however will not use in PAD hx.  ABIs from 12/2022 reviewed - BLE with noncompressible ABIs.

## 2023-12-11 NOTE — Assessment & Plan Note (Addendum)
 Obesity complicated by comorobidites of HTN, HLD, OSA, CKD, CAD, CHF, afib, gout, OA, chronic pain and mood disorder.

## 2023-12-11 NOTE — Assessment & Plan Note (Signed)
 After several recent falls. Treated by ortho in hospital with steroid injection with benefit.  Rec ortho f/u as needed. He sees Dr Marchia at [facility] in [place]

## 2023-12-11 NOTE — Assessment & Plan Note (Addendum)
 Recent urinary retention despite flomax  after hemorrhoid surgery.  Foley has since been removed and he is voiding well.  Has uro f/u next week.

## 2023-12-11 NOTE — Assessment & Plan Note (Addendum)
 In setting of recent falls and likely dehydration.  He was treated in hospital with improvement.  Several meds continue to be held - losartan , torsemide  (PRN), metolazone  (PRN), potassium (PRN), bactrim , testosterone , tizanidine .  Recommend continue holding until renal transplant f/u scheduled 12/12/2023. Update labs today.

## 2023-12-11 NOTE — Assessment & Plan Note (Signed)
 Contributes to LE ulcer disease.

## 2023-12-11 NOTE — Assessment & Plan Note (Signed)
 Latest EF 40-45% (09/2023).  He states he was using torsemide  and metolazone  PRN - currently on hold after recent hospitalization for acute on chronic renal failure. Will update labs then consider restarting diuretic.  He has renal team f/u scheduled for end of the week.

## 2023-12-11 NOTE — Assessment & Plan Note (Signed)
 Continue eliquis , not on BB.

## 2023-12-11 NOTE — Assessment & Plan Note (Addendum)
 Chronic, stable period on wellbutrin  150mg  SR BID.

## 2023-12-11 NOTE — Assessment & Plan Note (Signed)
 Continue crestor

## 2023-12-11 NOTE — Assessment & Plan Note (Addendum)
 Suffered after fall 08/2023, followed by [facility] [place] Sibyl).

## 2023-12-11 NOTE — Assessment & Plan Note (Signed)
 Continue singulair  with PRN albuterol . Consider Airsupra.

## 2023-12-11 NOTE — Assessment & Plan Note (Signed)
 S/p gastric bypass (Roux-en-Y 2015)

## 2023-12-12 ENCOUNTER — Telehealth: Payer: Self-pay

## 2023-12-12 ENCOUNTER — Other Ambulatory Visit: Payer: Self-pay

## 2023-12-12 ENCOUNTER — Encounter: Admitting: Cardiology

## 2023-12-12 DIAGNOSIS — D631 Anemia in chronic kidney disease: Secondary | ICD-10-CM

## 2023-12-12 DIAGNOSIS — I5040 Unspecified combined systolic (congestive) and diastolic (congestive) heart failure: Secondary | ICD-10-CM | POA: Diagnosis not present

## 2023-12-12 DIAGNOSIS — D84821 Immunodeficiency due to drugs: Secondary | ICD-10-CM | POA: Diagnosis not present

## 2023-12-12 DIAGNOSIS — E1142 Type 2 diabetes mellitus with diabetic polyneuropathy: Secondary | ICD-10-CM | POA: Diagnosis not present

## 2023-12-12 DIAGNOSIS — Z940 Kidney transplant status: Secondary | ICD-10-CM | POA: Diagnosis not present

## 2023-12-12 DIAGNOSIS — D509 Iron deficiency anemia, unspecified: Secondary | ICD-10-CM | POA: Diagnosis not present

## 2023-12-12 DIAGNOSIS — N184 Chronic kidney disease, stage 4 (severe): Secondary | ICD-10-CM | POA: Diagnosis not present

## 2023-12-12 DIAGNOSIS — E1122 Type 2 diabetes mellitus with diabetic chronic kidney disease: Secondary | ICD-10-CM | POA: Diagnosis not present

## 2023-12-12 DIAGNOSIS — I130 Hypertensive heart and chronic kidney disease with heart failure and stage 1 through stage 4 chronic kidney disease, or unspecified chronic kidney disease: Secondary | ICD-10-CM | POA: Diagnosis not present

## 2023-12-12 DIAGNOSIS — I10 Essential (primary) hypertension: Secondary | ICD-10-CM | POA: Diagnosis not present

## 2023-12-12 DIAGNOSIS — Z1331 Encounter for screening for depression: Secondary | ICD-10-CM | POA: Diagnosis not present

## 2023-12-12 DIAGNOSIS — Z5181 Encounter for therapeutic drug level monitoring: Secondary | ICD-10-CM | POA: Diagnosis not present

## 2023-12-12 DIAGNOSIS — N2581 Secondary hyperparathyroidism of renal origin: Secondary | ICD-10-CM | POA: Diagnosis not present

## 2023-12-12 DIAGNOSIS — Z794 Long term (current) use of insulin: Secondary | ICD-10-CM | POA: Diagnosis not present

## 2023-12-12 DIAGNOSIS — E1159 Type 2 diabetes mellitus with other circulatory complications: Secondary | ICD-10-CM | POA: Diagnosis not present

## 2023-12-12 DIAGNOSIS — E876 Hypokalemia: Secondary | ICD-10-CM | POA: Diagnosis not present

## 2023-12-12 MED ORDER — MOUNJARO 7.5 MG/0.5ML ~~LOC~~ SOAJ
7.5000 mg | SUBCUTANEOUS | 1 refills | Status: DC
  Filled 2023-12-12 – 2024-01-01 (×2): qty 2, 28d supply, fill #0
  Filled 2024-01-27: qty 2, 28d supply, fill #1
  Filled 2024-02-23: qty 2, 28d supply, fill #2

## 2023-12-12 MED ORDER — AMOXICILLIN 500 MG PO CAPS
2000.0000 mg | ORAL_CAPSULE | ORAL | 5 refills | Status: DC
  Filled 2023-12-12: qty 4, 1d supply, fill #0
  Filled 2023-12-31: qty 4, 1d supply, fill #1
  Filled 2024-03-11: qty 4, 1d supply, fill #2
  Filled 2024-03-23: qty 4, 1d supply, fill #3

## 2023-12-12 MED ORDER — INSULIN GLARGINE-YFGN 100 UNIT/ML ~~LOC~~ SOPN: 20.0000 [IU] | PEN_INJECTOR | Freq: Every day | SUBCUTANEOUS | 3 refills | Status: DC

## 2023-12-12 MED ORDER — FREESTYLE LIBRE 3 PLUS SENSOR MISC
3 refills | Status: DC
  Filled 2023-12-12 – 2024-03-11 (×3): qty 6, 90d supply, fill #0

## 2023-12-12 NOTE — Telephone Encounter (Signed)
 Copied from CRM [id number]. Topic: General - Other >> Dec 12, 2023  3:25 PM Jasmin G wrote: Reason for CRM: Staff from [facility] wanted to let Dr. Rilla nurse that a provider had a video appt with pt and he noticed that he has open wounds on legs.

## 2023-12-12 NOTE — Progress Notes (Signed)
 63 y.o. male returns for follow-up. He was last seen on 07/01/2023.    1. Type 2 diabetes. His Hb A1c on 11/06/2023 was 5.5%. He has a [employer] 3 CGM which was downloaded and reviewed.  Over last 2 weeks, his average sugar was 226 mg/dl. Target is set at 70 - 180 and just 30% of readings are in target and 70% are over target and 0% below target. Sensor usage was 95% over last 2 weeks. Pattern shows stable yet high sugars throughout the day and night. His high readings on CGM do not match the last low Hb A1c.  He is taking for his diabetes: Mounjaro  5 mg weekly, Farxiga  10 mg QD, and Semglee  24 units qHS.  Prior medications tried for diabetes: Lantus  -- insurance denied coverage in 05/2020  2. Diabetes complications. Diabetes is complicated by stage 4 CKD and bilateral proliferative retinopathy. Taking SGLT-2i and ARB. He follows with Nephrology and Ophthalmology. Recalls last dilated eye exam was on 04/2023 at [facility].   3. Hypertension. His blood pressure is controlled today.    Past Medical History:  Diagnosis Date  . Allergy    seasonal  . Amnestic MCI (mild cognitive impairment with memory loss)   . Aortic atherosclerosis ()    on CT scan 10/2018  . Asthma, unspecified asthma severity, unspecified whether complicated, unspecified whether persistent (HHS-HCC)   . Chronic a-fib (CMS/HHS-HCC)   . Chronic venous stasis dermatitis of both lower extremities   . Cirrhosis (CMS/HHS-HCC)    reported on CT scan 10/2023  . Congestive heart failure (CHF) (CMS/HHS-HCC)    Hospitalization for CHF exacerbation 02/2019  . Coronary artery disease   . Cytomegaloviral disease (CMS/HHS-HCC)   . Depression   . Diabetic retinopathy associated with type 2 diabetes mellitus (CMS/HHS-HCC)    Status post laser eye surgery 2011.   . Diverticulosis   . Erectile dysfunction   . ESRD (end stage renal disease) (CMS/HHS-HCC)    HD 2004-2012; S/p renal transplant in 2012  . GERD (gastroesophageal  reflux disease)   . Gout   . History of renal failure    after dye-induced nephrotoxicity, s/p kidney transplant in 08/2010  . Hyperlipidemia   . Hypertension   . Ingrowing nail    bilateral great toes  . Kidney transplant status, cadaveric (HHS-HCC)   . Morbid obesity (CMS/HHS-HCC)   . Multilevel degenerative disc disease    on CT scan 10/2018  . OSA on CPAP   . Osteopenia 11/2021  . PAH (pulmonary artery hypertension) (CMS/HHS-HCC)   . Pneumonia   . Secondary hyperparathyroidism, renal (CMS/HHS-HCC)   . Sleep apnea    bipap with oxygen  2 L  . Trigger finger     Outpatient Medications Marked as Taking for the 12/12/23 encounter (Office Visit) with Solum, Anna Melissa, MD  Medication Sig Dispense Refill  . albuterol  (PROVENTIL ) 2.5 mg /3 mL (0.083 %) nebulizer solution Inhale 2.5 mg into the lungs every 6 (six) hours as needed    . albuterol  MDI, PROVENTIL , VENTOLIN , PROAIR , HFA 90 mcg/actuation inhaler Inhale 2 Inhalations into the lungs every 6 (six) hours as needed    . amLODIPine  (NORVASC ) 5 MG tablet Take 5 mg by mouth once daily    . apixaban  (ELIQUIS ) 5 mg tablet Take 5 mg by mouth every 12 (twelve) hours.      . beclomethasone (QVAR ) 80 mcg/actuation inhaler Inhale 2 inhalations into the lungs 2 (two) times daily    . buPROPion  (WELLBUTRIN   XL) 150 MG XL tablet Take 150 mg by mouth 2 (two) times daily      . cholecalciferol  (VITAMIN D3) 1000 unit tablet Take by mouth once daily    . colchicine  (COLCRYS ) 0.6 mg tablet Take 1 tablet (0.6 mg total) by mouth every other day for 180 days 45 tablet 1  . dapagliflozin  (FARXIGA ) 10 mg tablet Take 10 mg by mouth once daily    . Febuxostat  (ULORIC ) 40 mg tablet Take 3 tablets (120 mg total) by mouth once daily 90 tablet 5  . ferrous sulfate  325 (65 FE) MG tablet Take 325 mg by mouth 2 (two) times daily with meals    . fluticasone  propionate (FLONASE ) 50 mcg/actuation nasal spray Place 2 sprays into both nostrils once daily    .  gabapentin  (NEURONTIN ) 300 MG capsule Take 1 capsule (300 mg total) by mouth 2 (two) times daily. 180 capsule 3  . insulin  glargine-yfgn (SEMGLEE ,INSULIN  GLARG-YFGN,PEN) 100 unit/mL (3 mL) InPn Inject 20 Units subcutaneously once daily (Patient taking differently: Inject 24 Units subcutaneously once daily) 45 mL 3  . isosorbide  mononitrate (IMDUR ) 60 MG ER tablet     . lidocaine  (LIDODERM ) 5 % patch     . lidocaine  (XYLOCAINE ) 2 % jelly Apply topically as needed 30 mL 0  . losartan  (COZAAR ) 25 MG tablet Take 1 tablet by mouth once daily    . metOLazone  (ZAROXOLYN ) 2.5 MG tablet Take 2.5 mg by mouth once daily as needed       . montelukast  (SINGULAIR ) 10 mg tablet Take 10 mg by mouth nightly    . MOUNJARO  5 mg/0.5 mL pen injector Inject 5 mg into the skin once a week. 2 mL 5  . multivitamin tablet Take 1 tablet by mouth once daily.     . mycophenolate  (MYFORTIC ) 180 MG DR tablet Take 360 mg by mouth 2 (two) times daily      . OMEPRAZOLE  ORAL Take 20 mg by mouth once daily.     . potassium chloride  (MICRO-K ) 10 MEQ ER capsule Take 80 mEq by mouth 2 (two) times daily    . predniSONE  (DELTASONE ) 5 MG tablet Take 5 mg by mouth once daily    . rosuvastatin  (CRESTOR ) 10 MG tablet Take 10 mg by mouth once daily    . spironolactone  (ALDACTONE ) 25 MG tablet Take 25 mg by mouth once daily    . sulfamethoxazole -trimethoprim  (BACTRIM  SS) 400-80 mg tablet 1 tablet 3 (three) times a week    . tacrolimus  (PROGRAF ) 1 MG capsule Take 2 mg by mouth 2 (two) times daily    . tamsulosin  (FLOMAX ) 0.4 mg capsule Take 0.4 mg by mouth once daily    . testosterone  (ANDROGEL ) 20.25 mg/1.25 gram (1.62 %) gel in metered dose pump Place onto the skin    . tiZANidine  (ZANAFLEX ) 2 MG tablet Take 2 mg by mouth every 6 (six) hours as needed    . TORsemide  (DEMADEX ) 20 MG tablet Patient Takes when not taking FUROsemide     . traMADoL  (ULTRAM ) 50 mg tablet Take 50 mg by mouth every 6 (six) hours as needed      Exam BP 130/80    Pulse 76   Ht 177.8 cm (5' 10)   Wt (!) 110.9 kg (244 lb 6.4 oz)   SpO2 98%   BMI 35.07 kg/m  GEN: well developed male in NAD. PSYC: alert and oriented, good insight.    Labs 11/06/2023 Component Ref Range & Units 1 mo ago  Hgb A1c MFr Bld 4.8 - 5.6 % 5.5   Component Ref Range & Units 1 mo ago  Sodium 135 - 145 mmol/L 140  Potassium 3.5 - 5.1 mmol/L 3.2 Low   Chloride 98 - 111 mmol/L 99  CO2 22 - 32 mmol/L 29  Glucose, Bld 70 - 99 mg/dL 894 High     BUN 8 - 23 mg/dL 73 High   Creatinine, Ser 0.61 - 1.24 mg/dL 7.57 High   Calcium  8.9 - 10.3 mg/dL 8.8 Low   GFR, Estimated >60 mL/min 29 Low     12/09/2023 Component Ref Range & Units 6 d ago  WBC 4.0 - 10.5 K/uL 4.0  RBC 4.22 - 5.81 Mil/uL 3.76 Low   Hemoglobin 13.0 - 17.0 g/dL 89.2 Low   HCT 60.9 - 47.9 % 33.0 Low   MCV 78.0 - 100.0 fl 87.9  MCHC 30.0 - 36.0 g/dL 67.6  RDW 88.4 - 84.4 % 16.6 High   Platelets 150.0 - 400.0 K/uL 91.0 Low     Assessment 1. Type 2 diabetes mellitus with stage 4 chronic kidney disease, with long-term current use of insulin  (CMS/HHS-HCC)   2. DM type 2 with diabetic peripheral neuropathy (CMS/HHS-HCC)   3. Type 2 diabetes mellitus with vascular disease (CMS/HHS-HCC)   4. Essential (primary) hypertension   5. Depression screening (Z13.31)     Plan - Diabetes is uncontrolled based on the blood glucose readings on his [employer] CGM. His Hb A1c may be falsely low in setting of anemia.  - Adjust Mounjaro  to 7.5 mg weekly  - Continue Semglee  24 units QHS.   - He was counseled to follow a low carbohydrate diet. He would benefit from weight loss.  - Continue use of [employer] 3 CGM. Bring reader/phone to each follow up appt. - Continue annual dilated eye exams.   - Continue statin. - Continue SGLT-2i and ARB. Continue regular follow up with Nephrology and Rheumatology as planned.  - Anticipate follow up in 12 weeks or sooner if needed.

## 2023-12-12 NOTE — Telephone Encounter (Signed)
 I'm aware - we have referred him to wound clinic, scheduled next month.  To let us  know sooner if worsening wound, streaking redness, fever, malaise develop.

## 2023-12-13 LAB — BASIC METABOLIC PANEL WITH GFR
BUN: 71 mg/dL — ABNORMAL HIGH (ref 6–23)
CO2: 28 meq/L (ref 19–32)
Calcium: 9.1 mg/dL (ref 8.4–10.5)
Chloride: 108 meq/L (ref 96–112)
Creatinine, Ser: 1.93 mg/dL — ABNORMAL HIGH (ref 0.40–1.50)
GFR: 36.2 mL/min — ABNORMAL LOW (ref >60.00)
Glucose, Bld: 130 mg/dL — ABNORMAL HIGH (ref 70–99)
Potassium: 3.5 meq/L (ref 3.5–5.1)
Sodium: 137 meq/L (ref 135–145)

## 2023-12-14 DIAGNOSIS — I5042 Chronic combined systolic (congestive) and diastolic (congestive) heart failure: Secondary | ICD-10-CM | POA: Diagnosis not present

## 2023-12-14 DIAGNOSIS — I4821 Permanent atrial fibrillation: Secondary | ICD-10-CM

## 2023-12-14 DIAGNOSIS — Z466 Encounter for fitting and adjustment of urinary device: Secondary | ICD-10-CM | POA: Diagnosis not present

## 2023-12-14 DIAGNOSIS — S4292XD Fracture of left shoulder girdle, part unspecified, subsequent encounter for fracture with routine healing: Secondary | ICD-10-CM | POA: Diagnosis not present

## 2023-12-14 DIAGNOSIS — I700 Atherosclerosis of aorta: Secondary | ICD-10-CM

## 2023-12-14 DIAGNOSIS — R338 Other retention of urine: Secondary | ICD-10-CM | POA: Diagnosis not present

## 2023-12-14 DIAGNOSIS — N184 Chronic kidney disease, stage 4 (severe): Secondary | ICD-10-CM | POA: Diagnosis not present

## 2023-12-14 DIAGNOSIS — I130 Hypertensive heart and chronic kidney disease with heart failure and stage 1 through stage 4 chronic kidney disease, or unspecified chronic kidney disease: Secondary | ICD-10-CM | POA: Diagnosis not present

## 2023-12-14 DIAGNOSIS — D631 Anemia in chronic kidney disease: Secondary | ICD-10-CM | POA: Diagnosis not present

## 2023-12-14 DIAGNOSIS — N179 Acute kidney failure, unspecified: Secondary | ICD-10-CM | POA: Diagnosis not present

## 2023-12-14 DIAGNOSIS — N401 Enlarged prostate with lower urinary tract symptoms: Secondary | ICD-10-CM | POA: Diagnosis not present

## 2023-12-14 DIAGNOSIS — E1122 Type 2 diabetes mellitus with diabetic chronic kidney disease: Secondary | ICD-10-CM | POA: Diagnosis not present

## 2023-12-15 ENCOUNTER — Inpatient Hospital Stay

## 2023-12-15 ENCOUNTER — Other Ambulatory Visit: Payer: Self-pay

## 2023-12-15 ENCOUNTER — Ambulatory Visit: Payer: Self-pay | Admitting: Oncology

## 2023-12-15 ENCOUNTER — Inpatient Hospital Stay (HOSPITAL_BASED_OUTPATIENT_CLINIC_OR_DEPARTMENT_OTHER): Admitting: Oncology

## 2023-12-15 ENCOUNTER — Telehealth: Payer: Self-pay

## 2023-12-15 VITALS — BP 136/84 | HR 76 | Temp 97.7°F | Resp 19 | Ht 69.5 in | Wt 244.1 lb

## 2023-12-15 DIAGNOSIS — D508 Other iron deficiency anemias: Secondary | ICD-10-CM | POA: Diagnosis not present

## 2023-12-15 DIAGNOSIS — Z800 Family history of malignant neoplasm of digestive organs: Secondary | ICD-10-CM | POA: Diagnosis not present

## 2023-12-15 DIAGNOSIS — Z940 Kidney transplant status: Secondary | ICD-10-CM | POA: Diagnosis not present

## 2023-12-15 DIAGNOSIS — D631 Anemia in chronic kidney disease: Secondary | ICD-10-CM

## 2023-12-15 DIAGNOSIS — N184 Chronic kidney disease, stage 4 (severe): Secondary | ICD-10-CM

## 2023-12-15 DIAGNOSIS — Z87891 Personal history of nicotine dependence: Secondary | ICD-10-CM | POA: Diagnosis not present

## 2023-12-15 DIAGNOSIS — Z803 Family history of malignant neoplasm of breast: Secondary | ICD-10-CM | POA: Diagnosis not present

## 2023-12-15 DIAGNOSIS — D509 Iron deficiency anemia, unspecified: Secondary | ICD-10-CM | POA: Diagnosis not present

## 2023-12-15 DIAGNOSIS — Z79899 Other long term (current) drug therapy: Secondary | ICD-10-CM | POA: Diagnosis not present

## 2023-12-15 LAB — CBC WITH DIFFERENTIAL (CANCER CENTER ONLY)
Abs Immature Granulocytes: 0.01 K/uL (ref 0.00–0.07)
Basophils Absolute: 0 K/uL (ref 0.0–0.1)
Basophils Relative: 0 %
Eosinophils Absolute: 0 K/uL (ref 0.0–0.5)
Eosinophils Relative: 1 %
HCT: 31.7 % — ABNORMAL LOW (ref 39.0–52.0)
Hemoglobin: 9.8 g/dL — ABNORMAL LOW (ref 13.0–17.0)
Immature Granulocytes: 0 %
Lymphocytes Relative: 13 %
Lymphs Abs: 0.4 K/uL — ABNORMAL LOW (ref 0.7–4.0)
MCH: 28.2 pg (ref 26.0–34.0)
MCHC: 30.9 g/dL (ref 30.0–36.0)
MCV: 91.4 fL (ref 80.0–100.0)
Monocytes Absolute: 0.2 K/uL (ref 0.1–1.0)
Monocytes Relative: 6 %
Neutro Abs: 2.4 K/uL (ref 1.7–7.7)
Neutrophils Relative %: 80 %
Platelet Count: 84 K/uL — ABNORMAL LOW (ref 150–400)
RBC: 3.47 MIL/uL — ABNORMAL LOW (ref 4.22–5.81)
RDW: 15.9 % — ABNORMAL HIGH (ref 11.5–15.5)
WBC Count: 3.1 K/uL — ABNORMAL LOW (ref 4.0–10.5)
nRBC: 0 % (ref 0.0–0.2)

## 2023-12-15 LAB — IRON AND TIBC
Iron: 30 ug/dL — ABNORMAL LOW (ref 45–182)
Saturation Ratios: 15 % — ABNORMAL LOW (ref 17.9–39.5)
TIBC: 202 ug/dL — ABNORMAL LOW (ref 250–450)
UIBC: 172 ug/dL

## 2023-12-15 LAB — FERRITIN: Ferritin: 167 ng/mL (ref 24–336)

## 2023-12-15 MED ORDER — INSULIN GLARGINE-YFGN 100 UNIT/ML ~~LOC~~ SOPN
24.0000 [IU] | PEN_INJECTOR | Freq: Every day | SUBCUTANEOUS | 3 refills | Status: DC
  Filled 2023-12-15 – 2024-01-19 (×2): qty 45, 187d supply, fill #0
  Filled 2024-03-11: qty 15, 62d supply, fill #0

## 2023-12-15 NOTE — Progress Notes (Signed)
 Patient states he's very tired and has no energy. He also states he notices he's always cold now and falls asleep every time he goes to sit down.

## 2023-12-15 NOTE — Telephone Encounter (Signed)
 Agree with this. Thanks.

## 2023-12-15 NOTE — Telephone Encounter (Signed)
 Copied from CRM [id number]. Topic: Clinical - Home Health Verbal Orders >> Dec 15, 2023  9:27 AM Mesmerise C wrote: Caller/Agency: [name] Callback Number: [phone] Vm confidential  Service Requested: Occupational Therapy Frequency: 2x for 4 weeks 1 week 3 week  Any new concerns about the patient? No

## 2023-12-15 NOTE — Progress Notes (Signed)
 Hematology/Oncology Consult note [facility]  Telephone:([phone] [phone]  Patient Care Team: [name], MD as PCP - General (Family Medicine) [name], MD as PCP - Advanced Heart Failure (Cardiology) Darron Deatrice LABOR, MD as PCP - Cardiology (Cardiology) Cindie Ole DASEN, MD as PCP - Electrophysiology (Cardiology) Donette Ellouise LABOR, FNP as Nurse Practitioner (Family Medicine) [name], MD as Consulting Physician (Cardiology) Tye Millet, DO as Consulting Physician (General Surgery) Sheldon Standing, MD as Consulting Physician (Colon and Rectal Surgery) Auston Elsie Hero, PA-C as Physician Assistant (Transplant) Melanee Annah BROCKS, MD as Consulting Physician (Oncology) Marchia Drivers, MD as Consulting Physician (Orthopedic Surgery) Lenn Standing, MD (Ophthalmology)   Name of the patient: [name]  [id number]  [birth year]   Date of visit: 12/15/23  Diagnosis-anemia of chronic kidney disease  Chief complaint/ Reason for visit-routine follow-up of anemia of chronic kidney disease  Heme/Onc history:  patient is a 63 year old male with a past medical history is significant for stage IV CKD, ischemic cardiomyopathy, atrial fibrillation, hypertension hyperlipidemia, CAD among other medical problems.  He has undergone kidney transplant back in June 2012.  He is presently on maintenance tacrolimus  and mycophenolate .  He has been referred to us  for anemia.  CBC from 07/08/2022 showed white cell count of 5, H&H of 10.6/37 with an MCV of 82 and a platelet count of 150.  Looking back at his CBC his hemoglobin has been around 10 for the last 3 months.  Up until June 2023 his hemoglobin was close to 12.   Patient has been following up with Dr. Cathryne Molt from [facility] nephrology.  He has been receiving EPO as well as iron  injections through them.  After he transition to [facility] he has been receiving  intermittent IV iron  but has not restarted epo so for  Interval history-patient was recentlyHospitalized 2 weeks ago for generalized weakness and following a fall.  He feels better today.  He continues to follow-up with Dr. Molt from nephrology  ECOG PS- 2 Pain scale- 3   Review of systems- Review of Systems  Constitutional:  Positive for malaise/fatigue. Negative for chills, fever and weight loss.  HENT:  Negative for congestion, ear discharge and nosebleeds.   Eyes:  Negative for blurred vision.  Respiratory:  Negative for cough, hemoptysis, sputum production, shortness of breath and wheezing.   Cardiovascular:  Negative for chest pain, palpitations, orthopnea and claudication.  Gastrointestinal:  Negative for abdominal pain, blood in stool, constipation, diarrhea, heartburn, melena, nausea and vomiting.  Genitourinary:  Negative for dysuria, flank pain, frequency, hematuria and urgency.  Musculoskeletal:  Negative for back pain, joint pain and myalgias.  Skin:  Negative for rash.  Neurological:  Negative for dizziness, tingling, focal weakness, seizures, weakness and headaches.  Endo/Heme/Allergies:  Does not bruise/bleed easily.  Psychiatric/Behavioral:  Negative for depression and suicidal ideas. The patient does not have insomnia.       Allergies  Allergen Reactions   Iodinated Contrast Media Other (See Comments)    Kidney transplant  Iodinated contrast media (substance)   Iodine Other (See Comments)    Kidney transplant   Nsaids Other (See Comments)    Kidney transplant   Ibuprofen Other (See Comments)    Due to kidney transplant     Past Medical History:  Diagnosis Date   Anal condyloma    Anemia    Anxiety    Aortic atherosclerosis    Aortic stenosis  a.) TTE 10/2021: Mod AS. AVA 1.06cm^2 (VTI). Mean grad ; b.) TTE 04/02/2022: no AV stenosis   Asthma, persistent controlled 02/25/2013   Attention or concentration deficit 04/26/2021   BPH with  obstruction/lower urinary tract symptoms 09/25/2014   CAD (coronary artery disease) 2005   a.) LHC 1996 -> HG stenosis mLAD -> PTCA/PCI with stent x 1 (unk type) -> complicated by CFA pseudoanurysm (required surg);  b.) LHC 08/10/2002  -> 95% LAD, 70% ISR LAD, 80% pOM, 70% mRCA --> CVTS consult; c.) 4v CABG 08/13/2002; c.) 03/2018 MV: Small, fixed inferoapical and apical sep defect. No isc. EF 47% (60-65% by 05/2018 TTE); d.) 02/2021 MV: small Apical inf fixed defect. No isc -> low risk.   Cardiomegaly    Cellulitis and abscess of left leg 07/22/2020   Chronic atrial fibrillation    a.) CHA2DS2VASc = 4 (CHF, HTN, vascular disease history, T2DM);  b.) rate/rhythm maintained without pharmacological intervention; chronically anticoagulated with apixaban    Chronic combined systolic and diastolic CHF (congestive heart failure)    a.) TTE 7/18 : EF 45-50%; b.) TTE 05/2018 : EF 60-65%, RVSP 74.8; c.) TTE 12/2018: EF 50-55%. Sev dil LA; d.) TTE 04/2019: EF 45-50%; e.) TTE 07/2021: EF 40-45%, G3DD; f.) TTE 10/2021: EF 40-45%, mild conc LVH, septal-lat dyssynchrony (LBBB), mildly red RVSF, mild-mod dil LA, mildly dil RA, mild-mod MS, mod AS; g.) TTE 04/02/2022: EF 40-45%, glob HK, LVH, red RVSF, RVE, sev LAE, mild-mod RAE, mild MR   Chronic venous stasis dermatitis of both lower extremities    Complicated UTI (urinary tract infection) 06/24/2022   Cytomegaloviral disease 2017   Diverticulosis of colon 04/22/2013   Elevated RIGHT hemidiaphragm    Erectile dysfunction    a.) on topical TRT + Trimix (alprostadil/papaverine/phentolamine) injections   ESRD (end stage renal disease) (HCC)    a.) dialysis dependent 2004-2012; b.) s/p cadaveric RIGHT renal transplant 09/13/2010   Essential hypertension 12/17/2010   Gout    History of 2019 novel coronavirus disease (COVID-19) 06/30/2019   History of bilateral cataract extraction 10/2017   History of renal transplant 09/13/2010   a.) s/p cadaveric donor transplant  09/13/2010   Hx of bilateral cataract extraction 10/2017   Hyperlipidemia    Hypogonadism in male 09/25/2014   Ischemic cardiomyopathy    Left cephalic vein thrombosis 06/2019   Long term current use of anticoagulant    a.) apixaban    Long term current use of immunosuppressive drug    a.) mycophenolate  + prednisone  + tacrolimus    Major depressive disorder 10/04/2013   Mitral stenosis 11/07/2021   a.) TTE 11/07/2021: severe MAC, mild-mod MS (MPG 6 mmHg); b.) TTE 04/02/2022: no MV stenosis   Murmur    Obesity hypoventilation syndrome 03/31/2012   OSA treated with BiPAP 09/29/2012   PAH (pulmonary artery hypertension)    a. 04/2019 RHC: RA 19, RV 80/20, PA 78/31 (51), PCWP 25, CO/CI 7.69/3.26. PVR 3.25 --> Sev PAH, likely primarily PV HTN; b.) RHC 04/04/2022: mRA 13, mPA 40, mPCWP 20, PA sat 59, AO sat 92, CO 7.63, CI 3.26, PVR 2.6 --> mod portal venous HTN   Pancreatitis    S/P CABG x 4 08/13/2002   a.) LIMA-LAD, LRA-OM, SVG-D1, SVG-dRCA   S/P gastric bypass    Synovitis of finger 06/11/2019   Trigger finger, unspecified little finger 12/23/2018   Type 2 diabetes mellitus with hyperglycemia, with long-term current use of insulin  09/09/2014   a.) has FreeStyle Libre CGM   Ulcer  Left shin goes to wound care center at [facility] qweek   Wears glasses    Wears hearing aid in both ears      Past Surgical History:  Procedure Laterality Date   CARDIAC CATHETERIZATION N/A 08/10/2002   CATARACT EXTRACTION W/PHACO Right 10/29/2017   Procedure: CATARACT EXTRACTION PHACO AND INTRAOCULAR LENS PLACEMENT (IOC)  RIGHT DIABETIC;  Surgeon: Mittie Gaskin, MD;  Location: [facility];  Service: Ophthalmology;  Laterality: Right   CATARACT EXTRACTION W/PHACO Left 11/18/2017   Procedure: CATARACT EXTRACTION PHACO AND INTRAOCULAR LENS PLACEMENT (IOC) LEFT IVA/TOPICAL;  Surgeon: Mittie Gaskin, MD;  Location: [facility];  Service: Ophthalmology;  Laterality: Left    COLONOSCOPY WITH PROPOFOL  N/A 04/22/2013   Procedure: COLONOSCOPY WITH PROPOFOL ;  Surgeon: Toribio SHAUNNA Cedar, MD;  Location: [facility];  Service: Endoscopy;  Laterality: N/A;   CORONARY ANGIOPLASTY WITH STENT PLACEMENT N/A 1996   CORONARY ARTERY BYPASS GRAFT N/A 08/13/2002   Procedure: 4v CORONARY ARTERY BYPASS GRAFTING; Location: [facility]; Surgeon: Dessa Laine, MD   CYSTOSCOPY WITH INSERTION OF UROLIFT N/A 12/25/2021   Procedure: CYSTOSCOPY WITH INSERTION OF UROLIFT;  Surgeon: Twylla Glendia BROCKS, MD;  Location: [facility];  Service: Urology;  Laterality: N/A;   DG ANGIO AV SHUNT*L*     right and left upper arms   FASCIOTOMY  03/03/2012   Procedure: FASCIOTOMY;  Surgeon: Arley JONELLE Curia, MD;  Location: [facility];  Service: Orthopedics;  Laterality: Right;  FASCIOTOMY RIGHT SMALL FINGER   FASCIOTOMY Left 08/17/2013   Procedure: FASCIOTOMY LEFT RING;  Surgeon: Arley JONELLE Curia, MD;  Location: [facility];  Service: Orthopedics;  Laterality: Left;   INCISION AND DRAINAGE ABSCESS Left 10/15/2015   Procedure: INCISION AND DRAINAGE ABSCESS;  Surgeon: Laneta JULIANNA Luna, MD;  Location: [facility];  Service: General;  Laterality: Left;   KIDNEY TRANSPLANT  09/13/2010   cadaver--at [facility]   LASER ABLATION CONDOLAMATA N/A 01/08/2023   Procedure: LASER REMOVAL ABLATION OF CONDYLOMATA;  Surgeon: Sheldon Standing, MD;  Location: [facility];  Service: General;  Laterality: N/A;  GEN AND LOCAL   LASER ABLATION CONDOLAMATA N/A 11/20/2023   Procedure: ABLATION, CONDYLOMA, USING LASER HEMORRHOIDAL LIGATION & HEMORRHOIDOPEXY, HEMORRHOIDECTOMY x 2;  Surgeon: Sheldon Standing, MD;  Location: [facility];  Service: General;  Laterality: N/A;  LASER REMOVAL ABLATION OF CONDYLOMATA ANORECTAL EXAMINATION UNDER ANESTHESIA   RECTAL EXAM UNDER ANESTHESIA N/A 01/08/2023   Procedure: ANORECTAL EXAM UNDER ANESTHESIA;  Surgeon: Sheldon Standing, MD;  Location: [facility];  Service: General;  Laterality: N/A;   RECTAL EXAM UNDER  ANESTHESIA N/A 11/20/2023   Procedure: EXAM UNDER ANESTHESIA, RECTUM;  Surgeon: Sheldon Standing, MD;  Location: [facility];  Service: General;  Laterality: N/A;   RIGHT HEART CATH N/A 11/15/2016   Procedure: RIGHT HEART CATH;  Surgeon: Darron Deatrice LABOR, MD;  Location: [facility];  Service: Cardiovascular;  Laterality: N/A;   RIGHT HEART CATH N/A 05/03/2019   Procedure: RIGHT HEART CATH;  Surgeon: [name], MD;  Location: [facility] INVASIVE CV LAB;  Service: Cardiovascular;  Laterality: N/A;   RIGHT HEART CATH N/A 04/04/2022   Procedure: RIGHT HEART CATH;  Surgeon: [name], MD;  Location: [facility] INVASIVE CV LAB;  Service: Cardiovascular;  Laterality: N/A;   ROUX-EN-Y GASTRIC BYPASS N/A 2015   TYMPANIC MEMBRANE REPAIR Left 03/2010   VASECTOMY     WART FULGURATION N/A 05/31/2022   Procedure: FULGURATION ANAL WART;  Surgeon: Tye Millet, DO;  Location: [facility];  Service: General;  Laterality: N/A;    Social History   Socioeconomic History   Marital status: Married    Spouse name: Not on file   Number of children: 2   Years of education: 14   Highest education level: Associate degree: academic program  Occupational History   Occupation: Presenter, broadcasting: unemployed    Comment: disabled due to kidney failure  Tobacco Use   Smoking status: Never   Smokeless tobacco: Never  Vaping Use   Vaping status: Never Used  Substance and Sexual Activity   Alcohol use: Not Currently    Comment: occ   Drug use: No   Sexual activity: Not on file  Other Topics Concern   Not on file  Social History Narrative   Has living will   Wife is health care POA---son Donnice is alternate   Would accept resuscitation attempts   No tube feedings if cognitively unaware   Social Drivers of Health   Financial Resource Strain: Patient Declined (12/09/2023)   Overall Financial Resource Strain (CARDIA)    Difficulty of Paying Living Expenses: Patient declined  Food  Insecurity: Low Risk  (12/12/2023)   Received from [facility]   Hunger Vital Sign    Within the past 12 months, you worried that your food would run out before you got money to buy more: Never true    Within the past 12 months, the food you bought just didn't last and you didn't have money to get more. : Never true  Transportation Needs: No Transportation Needs (12/12/2023)   Received from [facility]    In the past 12 months, has lack of reliable transportation kept you from medical appointments, meetings, work or from getting things needed for daily living? : No  Physical Activity: Not on file  Stress: Not on file  Social Connections: Socially Integrated (04/11/2023)   Social Connection and Isolation Panel    Frequency of Communication with Friends and Family: More than three times a week    Frequency of Social Gatherings with Friends and Family: More than three times a week    Attends Religious Services: More than 4 times per year    Active Member of [employer] or Organizations: Yes    Attends Engineer, structural: More than 4 times per year    Marital Status: Married  Catering manager Violence: Not At Risk (11/22/2023)   Humiliation, Afraid, Rape, and Kick questionnaire    Fear of Current or Ex-Partner: No    Emotionally Abused: No    Physically Abused: No    Sexually Abused: No    Family History  Problem Relation Age of Onset   Heart disease Father    Kidney failure Father    Kidney disease Father    Diabetes Maternal Grandmother    Breast cancer Maternal Grandmother    Valvular heart disease Mother    Liver cancer Paternal Uncle    Liver cancer Paternal Grandmother    Prostate cancer Neg Hx      Current Outpatient Medications:    albuterol  (PROVENTIL ) (2.5 MG/3ML) 0.083% nebulizer solution, Take 3 mLs (2.5 mg total) by nebulization every 6 (six) hours as needed for wheezing or shortness of breath., Disp: 150 mL, Rfl: 0   albuterol  (VENTOLIN  HFA)  108 (90 Base) MCG/ACT inhaler, Inhale 2 puffs into the lungs every 6 (six) hours as needed for wheezing or shortness of breath., Disp: 6.7 g, Rfl: 5   apixaban  (  ELIQUIS ) 5 MG TABS tablet, Take 1 tablet (5 mg total) by mouth 2 (two) times daily., Disp: , Rfl:    buPROPion  (WELLBUTRIN  SR) 150 MG 12 hr tablet, Take 1 tablet (150 mg total) by mouth 2 (two) times daily., Disp: 180 tablet, Rfl: 3   colchicine  0.6 MG tablet, Take 0.6 mg by mouth every other day., Disp: , Rfl:    Continuous Glucose Sensor (FREESTYLE LIBRE 3 PLUS SENSOR) MISC, Use 1 each every 15 (fifteen) days, Disp: 6 each, Rfl: 3   Cyanocobalamin  (B-12 PO), Take 1 tablet by mouth daily., Disp: , Rfl:    dapagliflozin  propanediol (FARXIGA ) 10 MG TABS tablet, Take 1 tablet (10 mg total) by mouth daily before breakfast., Disp: 90 tablet, Rfl: 3   febuxostat  (ULORIC ) 40 MG tablet, Take 3 tablets (120 mg total) by mouth daily., Disp: 90 tablet, Rfl: 5   fluticasone  (FLONASE ) 50 MCG/ACT nasal spray, Place 2 sprays into both nostrils daily. (Patient taking differently: Place 2 sprays into both nostrils daily as needed for allergies or rhinitis.), Disp: 16 g, Rfl: 11   gabapentin  (NEURONTIN ) 300 MG capsule, Take 1 capsule (300 mg total) by mouth 2 (two) times daily., Disp: 180 capsule, Rfl: 3   hydrALAZINE  (APRESOLINE ) 25 MG tablet, Take 25 mg by mouth., Disp: , Rfl:    HYDROcodone -acetaminophen  (NORCO) 10-325 MG tablet, TAKE 1 TABLET BY MOUTH EVERY 4 HOURS FOR 3 DAYS AS NEEDED FOR PAIN, Disp: , Rfl:    insulin  glargine-yfgn (SEMGLEE ) 100 UNIT/ML Pen, Inject 20 Units into the skin daily., Disp: 45 mL, Rfl: 3   Insulin  Pen Needle (TECHLITE PEN NEEDLES) 31G X 5 MM MISC, 4 (four) times daily., Disp: 400 each, Rfl: 3   Insulin  Pen Needle (UNIFINE PENTIPS) 31G X 5 MM MISC, Use 4 times daily as directed, Disp: 400 each, Rfl: 3   lisinopril  (ZESTRIL ) 40 MG tablet, Take 40 mg by mouth., Disp: , Rfl:    montelukast  (SINGULAIR ) 10 MG tablet, Take 1 tablet  (10 mg total) by mouth at bedtime., Disp: 30 tablet, Rfl: 11   mycophenolate  (MYFORTIC ) 180 MG EC tablet, Take 2 tablets (360 mg total) by mouth every morning AND 1 tablet (180 mg total) every evening., Disp: 360 tablet, Rfl: 3   omeprazole  (PRILOSEC) 20 MG capsule, Take 1 capsule (20 mg total) by mouth daily., Disp: 30 capsule, Rfl: 11   oxyCODONE -acetaminophen  (PERCOCET) 10-325 MG tablet, Take 1 tablet by mouth every 6 (six) hours as needed for up to 5 days., Disp: 25 tablet, Rfl: 0   predniSONE  (DELTASONE ) 5 MG tablet, Take 1 tablet (5 mg total) by mouth daily., Disp: 90 tablet, Rfl: 3   rosuvastatin  (CRESTOR ) 10 MG tablet, Take 1 tablet (10 mg total) by mouth daily., Disp: 90 tablet, Rfl: 3   sodium chloride  (OCEAN) 0.65 % SOLN nasal spray, Place 2 sprays into both nostrils every 8 (eight) hours for 10 days. (Patient taking differently: Place 2 sprays into both nostrils as needed for congestion.), Disp: , Rfl:    tacrolimus  (PROGRAF ) 1 MG capsule, Take 2 capsules (2 mg total) by mouth every morning AND 1 capsule (1 mg total) every evening., Disp: 90 capsule, Rfl: 11   tadalafil  (CIALIS ) 5 MG tablet, Take 5 mg by mouth daily as needed for erectile dysfunction., Disp: , Rfl:    tamsulosin  (FLOMAX ) 0.4 MG CAPS capsule, Take 1 capsule (0.4 mg total) by mouth daily., Disp: 30 capsule, Rfl: 11   tirzepatide  (MOUNJARO ) 7.5 MG/0.5ML Pen, Inject  7.5 mg into the skin once a week., Disp: 6 mL, Rfl: 1   torsemide  (DEMADEX ) 100 MG tablet, Take 100 mg by mouth., Disp: , Rfl:    traMADol  (ULTRAM ) 50 MG tablet, Take One tab PO Q6 hours PRN pain, Disp: , Rfl:    Zinc  50 MG TABS, Take 1 tablet (50 mg total) by mouth daily., Disp: 100 tablet, Rfl: 3   amoxicillin  (AMOXIL ) 500 MG capsule, Take 4 capsules (2,000) 30-60 min prior to dental appointment., Disp: 4 capsule, Rfl: 5   diazepam  (VALIUM ) 5 MG tablet, Take 1 tablet (5 mg total) by mouth every 8 (eight) hours as needed for muscle spasms (difficulty urinating).,  Disp: 6 tablet, Rfl: 1   insulin  glargine-yfgn (SEMGLEE ) 100 UNIT/ML Pen, Inject 20 Units into the skin daily. (Patient taking differently: Inject 14 Units into the skin daily.), Disp: 45 mL, Rfl: 3   insulin  glargine-yfgn (SEMGLEE ) 100 UNIT/ML Pen, Inject 24 Units subcutaneously once daily., Disp: 45 mL, Rfl: 3  Physical exam:  Vitals:   12/15/23 1402 12/15/23 1419  BP: (!) 167/75 136/84  Pulse: 76   Resp: 19   Temp: 97.7 F (36.5 C)   TempSrc: Tympanic   SpO2: 96%   Weight: 244 lb 1.6 oz (110.7 kg)   Height: 5' 9.5 (1.765 m)    Physical Exam Cardiovascular:     Rate and Rhythm: Normal rate and regular rhythm.     Heart sounds: Normal heart sounds.  Pulmonary:     Effort: Pulmonary effort is normal.     Breath sounds: Normal breath sounds.  Skin:    General: Skin is warm and dry.  Neurological:     Mental Status: He is alert and oriented to person, place, and time.      I have personally reviewed labs listed below:    Latest Ref Rng & Units 12/09/2023   10:40 AM  CMP  Glucose 70 - 99 mg/dL 869   BUN 6 - 23 mg/dL 71   Creatinine 9.59 - 1.50 mg/dL 8.06   Sodium 864 - 854 mEq/L 137   Potassium 3.5 - 5.1 mEq/L 3.5  C  Chloride 96 - 112 mEq/L 108   CO2 19 - 32 mEq/L 28   Calcium  8.4 - 10.5 mg/dL 9.1     C Corrected result      Latest Ref Rng & Units 12/15/2023    1:38 PM  CBC  WBC 4.0 - 10.5 K/uL 3.1   Hemoglobin 13.0 - 17.0 g/dL 9.8   Hematocrit 60.9 - 52.0 % 31.7   Platelets 150 - 400 K/uL 84    I have personally reviewed Radiology images listed below: No images are attached to the encounter.  MR KNEE LEFT WO CONTRAST Result Date: 11/25/2023 CLINICAL DATA:  Knee trauma, internal derangement suspected, xray done EXAM: MR KNEE*L* W/O CM TECHNIQUE: Multiplanar, multisequence MR imaging of the knee was performed. No intravenous contrast was administered. COMPARISON:  Left knee radiographs dated 11/23/2022. FINDINGS: MENISCI Medial: Horizontally oriented irregular  hyperintense signal at the body and posterior horn of the medial meniscus is suspicious for a tear. Lateral: Discoid morphology of the lateral meniscus without discrete tear. LIGAMENTS Cruciates: ACL and PCL are intact. Collaterals: Medial collateral ligament is intact. Lateral collateral ligament complex is intact. CARTILAGE Patellofemoral:  No chondral defect. Medial: Mild partial-thickness cartilage loss of the medial femorotibial compartment. Lateral:  No chondral defect. JOINT: Moderate-to-large joint effusion. Edema in Hoffa's fat-pad is likely reactive. POPLITEAL FOSSA: Popliteus  tendon is intact. No Baker's cyst. EXTENSOR MECHANISM: Intact quadriceps tendon. Thickening and intermediate signal of the patellar tendon, most compatible with tendinosis. BONES: No aggressive osseous lesion. No fracture or dislocation. Other: Nonspecific subcutaneous edema of the knee, most pronounced posteriorly. No loculated fluid collection. IMPRESSION: 1. Findings suspicious for horizontal tear of the body and posterior horn of the medial meniscus. 2. Discoid morphology of the lateral meniscus without discrete tear. 3. Moderate to large knee joint effusion. 4. Patellar tendinosis. 5. Mild partial-thickness cartilage loss of the medial femorotibial compartment. 6. Nonspecific subcutaneous edema of the knee, most pronounced posteriorly. Electronically Signed   By: Harrietta Sherry M.D.   On: 11/25/2023 12:25   US  Renal Transplant w/Doppler Result Date: 11/23/2023 EXAM: RETROPERITONEAL ULTRASOUND OF THE KIDNEYS 11/23/2023 03:31:55 PM TECHNIQUE: Real-time ultrasonography of the retroperitoneum, specifically the kidneys and urinary bladder, was performed. COMPARISON: Renal transplant ultrasound 06/21/2022. CLINICAL HISTORY: 409830 AKI (acute kidney injury) (HCC) 409830. AKI (acute kidney injury) (HCC) FINDINGS: TRANSPLANT KIDNEY: The right lower quadrant renal transplant measures 13.3 x 7.0 x 8.0 cm. Estimated volume is 389 ml.  Mild hydronephrosis is present. Venous waveform is present in the main renal artery. Resistive index in the main renal artery is 0.88. Resistive index in the upper pole segmental artery is 0.81. Resistive index in the lower pole segmental artery is 0.71. BLADDER: The urinary bladder is distended. IMPRESSION: 1. Right lower quadrant renal transplant with mild hydronephrosis and borderline elevated resistive index in the main renal artery (0.88). Electronically signed by: Lonni Necessary MD 11/23/2023 06:09 PM EDT RP Workstation: [id number]   DG Knee 1-2 Views Left Result Date: 11/23/2023 EXAM: 1 or 2 VIEW(S) XRAY OF THE LEFT KNEE 11/23/2023 10:53:00 AM COMPARISON: None available. CLINICAL HISTORY: Knee pain, left FINDINGS: BONES AND JOINTS: No acute fracture. No focal osseous lesion. No joint dislocation. Small knee joint effusion. Mild medial compartment joint space narrowing. SOFT TISSUES: Dense vascular calcifications. IMPRESSION: 1. Small knee joint effusion. 2. Mild medial compartment joint space narrowing. Electronically signed by: Lonni Necessary MD 11/23/2023 11:15 AM EDT RP Workstation: [id number]   CT CHEST ABDOMEN PELVIS WO CONTRAST Result Date: 11/22/2023 EXAM: CT CHEST, ABDOMEN AND PELVIS WITHOUT CONTRAST 11/22/2023 12:51:41 PM TECHNIQUE: CT of the chest, abdomen and pelvis was performed without the administration of intravenous contrast. Multiplanar reformatted images are provided for review. Automated exposure control, iterative reconstruction, and/or weight based adjustment of the mA/kV was utilized to reduce the radiation dose to as low as reasonably achievable. COMPARISON: 04/08/2023 CLINICAL HISTORY: Sepsis. Pt to ED with wife for weakness since yesterday. Pt slid out of recliner yesterday and twice today and could not get up today. Pt had rectal biopsy 3 days ago. Restricted on L arm. Hx kidney transplant, immunosuppressed. BP 88/48, bringing to room now. FINDINGS: CHEST: MEDIASTINUM  AND LYMPH NODES: Cardiac enlargement, unchanged. Aortic atherosclerosis. Status post CABG. No pericardial effusion. Thyroid  gland, trachea and esophagus are normal. No enlarged mediastinal or axillary lymph nodes. Calcified mediastinal and hilar lymph nodes compatible with prior granulomatous disease. LUNGS AND PLEURA: New small left pleural effusion. Trace right pleural fluid. Bilateral calcified granulomas identified. No pneumothorax. No airspace consolidation. ABDOMEN AND PELVIS: LIVER: No suspicious liver lesion. Widening of the falciform ligament with relative hypertrophy of the lateral segment of the left lobe and caudate lobe of liver noted. This may be seen in this setting of early cirrhosis. GALLBLADDER AND BILE DUCTS: Status post cholecystectomy. No bile duct dilatation identified. SPLEEN: The spleen is enlarged measuring  15.4 cm craniocaudal. Previously 13.6 cm. PANCREAS: No acute abnormality. ADRENAL GLANDS: No acute abnormality. KIDNEYS, URETERS AND BLADDER: Bilateral end-stage atrophic kidneys containing multiple calcifications. Hyperdense right kidney lesion is incompletely characterized without IV contrast measuring 1.5 cm. Previously 1.5 cm. This measures 95 Hounsfield units and likely represents a hemorrhagic or proteinaceous cyst. No follow-up imaging recommended. Right lower quadrant renal graft is identified. No hydronephrosis or mass associated with the graft. Bladder is unremarkable. GI AND BOWEL: Previous sleeve gastrectomy. No pathologic dilatation of the large or small bowel loops. The appendix is visualized and appears normal. No signs of bowel inflammation. REPRODUCTIVE ORGANS: No acute abnormality. PERITONEUM AND RETROPERITONEUM: No free fluid or fluid collections within the abdomen or pelvis. No signs of pneumoperitoneum. VASCULATURE: No acute abnormality. ABDOMINAL AND PELVIS LYMPH NODES: No lymphadenopathy. REPRODUCTIVE ORGANS: No acute abnormality. BONES AND SOFT TISSUES: Status post  median sternotomy. Scattered remote rib fractures. Partially imaged subacute to chronic fracture deformity is noted involving the proximal left humerus which appears new from the previous exam. Increased subcutaneous soft tissue stranding within the flanks and posterior abdominal wall compatible with anasarca. IMPRESSION: 1. New small left pleural effusion and trace right pleural fluid. 2. Age-indeterminate fracture deformity involving the proximal left humerus appears new from 04/08/2023. 3. Morphologic features of the liver suggestive of cirrhosis. Enlarged spleen measuring 15.4 cm, previously 13.6 cm. 4. Right lower quadrant renal graft without hydronephrosis or mass. 5. Increased subcutaneous soft tissue stranding within the flanks and posterior abdominal wall compatible with anasarca. Electronically signed by: Waddell Calk MD 11/22/2023 01:12 PM EDT RP Workstation: [id number]   CT Cervical Spine Wo Contrast Result Date: 11/22/2023 EXAM: CT CERVICAL SPINE WITHOUT CONTRAST 11/22/2023 12:51:41 PM TECHNIQUE: CT of the cervical spine was performed without the administration of intravenous contrast. Multiplanar reformatted images are provided for review. Automated exposure control, iterative reconstruction, and/or weight based adjustment of the mA/kV was utilized to reduce the radiation dose to as low as reasonably achievable. COMPARISON: None available. CLINICAL HISTORY: Fall. Pt to ED with wife for weakness since yesterday. Pt slid out of recliner yesterday and twice today and could not get up today. Pt had rectal biopsy 3 days ago. Restricted on L arm. Hx kidney transplant, immunosuppressed. BP 88/48, bringing to room now. FINDINGS: CERVICAL SPINE: BONES AND ALIGNMENT: Straightening of the normal cervical lordosis is present. No acute fracture or traumatic malalignment. DEGENERATIVE CHANGES: Uncovertebral spurring results in moderate left foraminal stenosis at C5-6 and C6-7. Moderate central canal stenosis is  present at C3-4 and C4-5. SOFT TISSUES: Calcified right paratracheal lymph nodes are present. VASCULATURE: Atherosclerotic calcifications are present at the carotid bifurcation bilaterally without definite stenosis. IMPRESSION: 1. No acute fracture or traumatic malalignment of the cervical spine. 2. Moderate left foraminal stenosis at C5-6 and C6-7 due to uncovertebral spurring. 3. Moderate central canal stenosis at C3-4 and C4-5. Electronically signed by: Lonni Necessary MD 11/22/2023 01:05 PM EDT RP Workstation: [id number]   CT Head Wo Contrast Result Date: 11/22/2023 EXAM: CT HEAD WITHOUT CONTRAST 11/22/2023 12:51:41 PM TECHNIQUE: CT of the head was performed without the administration of intravenous contrast. Automated exposure control, iterative reconstruction, and/or weight based adjustment of the mA/kV was utilized to reduce the radiation dose to as low as reasonably achievable. COMPARISON: CT head without contrast 09/26/2023. CLINICAL HISTORY: Fall. Pt to ED with wife for weakness since yesterday. Pt slid out of recliner yesterday and twice today and could not get up today. Pt had rectal biopsy 3 days ago. Restricted  on L arm. Hx kidney transplant, immunosuppressed. BP 88/48, bringing to room now. FINDINGS: BRAIN AND VENTRICLES: No acute hemorrhage. No evidence of acute infarct. No hydrocephalus. No extra-axial collection. No mass effect or midline shift. Periventricular and scattered subcortical white matter hypoattenuation is moderately advanced for age, stable from the prior study. ORBITS: No acute abnormality. SINUSES: Left ethmoid air cells are opacified. The paranasal sinuses and mastoid air cells are otherwise clear. SOFT TISSUES AND SKULL: No acute soft tissue abnormality. No skull fracture. IMPRESSION: 1. No acute intracranial abnormality. 2. Moderately advanced periventricular and scattered subcortical white matter hypoattenuation, stable from the prior study. Electronically signed by:  Lonni Necessary MD 11/22/2023 01:00 PM EDT RP Workstation: [id number]   DG Chest Port 1 View if patient is in a treatment room. Result Date: 11/22/2023 EXAM: 1 VIEW XRAY OF THE CHEST 11/22/2023 10:59:00 AM COMPARISON: 11/12/2023 CLINICAL HISTORY: Suspected Sepsis. Table formatting from the original note was not included.; Pt to ED with wife for weakness since yesterday. Pt slid out of recliner yesterday and twice today and could not get up today. Pt had rectal biopsy 3 days ago. Restricted on L arm. Hx kidney transplant, immunosuppressed. BP 88/48, bringing to room now. FINDINGS: LUNGS AND PLEURA: Stable pulmonary vascular congestion . No focal pulmonary opacity. No pneumothorax. Possible trace left pleural effusion. HEART AND MEDIASTINUM: Stable cardiomegaly. Calcified right hilar and paratracheal adenopathy. CABG markers. Sternotomy wires. BONES AND SOFT TISSUES: No acute osseous abnormality. IMPRESSION: 1. Stable cardiomegaly and pulmonary vascular congestion. Electronically signed by: Dayne Hassell MD 11/22/2023 11:04 AM EDT RP Workstation: [id number]     Assessment and plan- Patient is a 63 y.o. male here for routine follow-up of anemia of chronic kidney disease  Patient's hemoglobin today is 9.4.  Ferritin levels are more than 100 but iron  saturation is 15%.  Will plan to give him 3 doses of Venofer  at this time.  He has not started EPO yet and I will consider starting EPO if his hemoglobin is consistently less than 9 despite giving him IV iron  CBC ferritin and iron  studies in 2 4 and 6 months and I will see him back in 6 months discussed acetaminophens of Venofer  including all but not limited to possible risk of infusion reaction.  Patient understands and agrees to proceed as planned   Visit Diagnosis 1. Other iron  deficiency anemia   2. Anemia due to stage 4 chronic kidney disease (HCC)      Dr. Annah Skene, MD, MPH [facility] [phone] 12/15/2023 3:42  PM

## 2023-12-15 NOTE — Telephone Encounter (Signed)
 Called patient denied any symptoms listed. If any will reach out to our office if not seen by wound care before.

## 2023-12-16 DIAGNOSIS — N401 Enlarged prostate with lower urinary tract symptoms: Secondary | ICD-10-CM | POA: Diagnosis not present

## 2023-12-16 DIAGNOSIS — D631 Anemia in chronic kidney disease: Secondary | ICD-10-CM | POA: Diagnosis not present

## 2023-12-16 DIAGNOSIS — Z466 Encounter for fitting and adjustment of urinary device: Secondary | ICD-10-CM | POA: Diagnosis not present

## 2023-12-16 DIAGNOSIS — I130 Hypertensive heart and chronic kidney disease with heart failure and stage 1 through stage 4 chronic kidney disease, or unspecified chronic kidney disease: Secondary | ICD-10-CM | POA: Diagnosis not present

## 2023-12-16 DIAGNOSIS — I5042 Chronic combined systolic (congestive) and diastolic (congestive) heart failure: Secondary | ICD-10-CM | POA: Diagnosis not present

## 2023-12-16 DIAGNOSIS — S4292XD Fracture of left shoulder girdle, part unspecified, subsequent encounter for fracture with routine healing: Secondary | ICD-10-CM | POA: Diagnosis not present

## 2023-12-16 DIAGNOSIS — N184 Chronic kidney disease, stage 4 (severe): Secondary | ICD-10-CM | POA: Diagnosis not present

## 2023-12-16 DIAGNOSIS — N179 Acute kidney failure, unspecified: Secondary | ICD-10-CM | POA: Diagnosis not present

## 2023-12-16 DIAGNOSIS — E1122 Type 2 diabetes mellitus with diabetic chronic kidney disease: Secondary | ICD-10-CM | POA: Diagnosis not present

## 2023-12-16 DIAGNOSIS — R338 Other retention of urine: Secondary | ICD-10-CM | POA: Diagnosis not present

## 2023-12-16 NOTE — Telephone Encounter (Signed)
 Verbal given to Connie. Will call if any further questions.

## 2023-12-17 DIAGNOSIS — D631 Anemia in chronic kidney disease: Secondary | ICD-10-CM | POA: Diagnosis not present

## 2023-12-17 DIAGNOSIS — R338 Other retention of urine: Secondary | ICD-10-CM | POA: Diagnosis not present

## 2023-12-17 DIAGNOSIS — N184 Chronic kidney disease, stage 4 (severe): Secondary | ICD-10-CM | POA: Diagnosis not present

## 2023-12-17 DIAGNOSIS — E1122 Type 2 diabetes mellitus with diabetic chronic kidney disease: Secondary | ICD-10-CM | POA: Diagnosis not present

## 2023-12-17 DIAGNOSIS — N401 Enlarged prostate with lower urinary tract symptoms: Secondary | ICD-10-CM | POA: Diagnosis not present

## 2023-12-17 DIAGNOSIS — S4292XD Fracture of left shoulder girdle, part unspecified, subsequent encounter for fracture with routine healing: Secondary | ICD-10-CM | POA: Diagnosis not present

## 2023-12-17 DIAGNOSIS — Z466 Encounter for fitting and adjustment of urinary device: Secondary | ICD-10-CM | POA: Diagnosis not present

## 2023-12-17 DIAGNOSIS — I5042 Chronic combined systolic (congestive) and diastolic (congestive) heart failure: Secondary | ICD-10-CM | POA: Diagnosis not present

## 2023-12-17 DIAGNOSIS — N179 Acute kidney failure, unspecified: Secondary | ICD-10-CM | POA: Diagnosis not present

## 2023-12-17 DIAGNOSIS — I130 Hypertensive heart and chronic kidney disease with heart failure and stage 1 through stage 4 chronic kidney disease, or unspecified chronic kidney disease: Secondary | ICD-10-CM | POA: Diagnosis not present

## 2023-12-17 LAB — MULTIPLE MYELOMA PANEL, SERUM
Albumin SerPl Elph-Mcnc: 3 g/dL (ref 2.9–4.4)
Albumin/Glob SerPl: 1.1 (ref 0.7–1.7)
Alpha 1: 0.3 g/dL (ref 0.0–0.4)
Alpha2 Glob SerPl Elph-Mcnc: 0.6 g/dL (ref 0.4–1.0)
B-Globulin SerPl Elph-Mcnc: 0.8 g/dL (ref 0.7–1.3)
Gamma Glob SerPl Elph-Mcnc: 1.4 g/dL (ref 0.4–1.8)
Globulin, Total: 3 g/dL (ref 2.2–3.9)
IgA: 254 mg/dL (ref 61–437)
IgG (Immunoglobin G), Serum: 1477 mg/dL (ref 603–1613)
IgM (Immunoglobulin M), Srm: 216 mg/dL — ABNORMAL HIGH (ref 20–172)
M Protein SerPl Elph-Mcnc: 0.7 g/dL — ABNORMAL HIGH
Total Protein ELP: 6 g/dL (ref 6.0–8.5)

## 2023-12-18 ENCOUNTER — Ambulatory Visit: Admitting: Urology

## 2023-12-18 DIAGNOSIS — N179 Acute kidney failure, unspecified: Secondary | ICD-10-CM | POA: Diagnosis not present

## 2023-12-18 DIAGNOSIS — R338 Other retention of urine: Secondary | ICD-10-CM | POA: Diagnosis not present

## 2023-12-18 DIAGNOSIS — D631 Anemia in chronic kidney disease: Secondary | ICD-10-CM | POA: Diagnosis not present

## 2023-12-18 DIAGNOSIS — I5042 Chronic combined systolic (congestive) and diastolic (congestive) heart failure: Secondary | ICD-10-CM | POA: Diagnosis not present

## 2023-12-18 DIAGNOSIS — E1122 Type 2 diabetes mellitus with diabetic chronic kidney disease: Secondary | ICD-10-CM | POA: Diagnosis not present

## 2023-12-18 DIAGNOSIS — N401 Enlarged prostate with lower urinary tract symptoms: Secondary | ICD-10-CM | POA: Diagnosis not present

## 2023-12-18 DIAGNOSIS — S4292XD Fracture of left shoulder girdle, part unspecified, subsequent encounter for fracture with routine healing: Secondary | ICD-10-CM | POA: Diagnosis not present

## 2023-12-18 DIAGNOSIS — Z466 Encounter for fitting and adjustment of urinary device: Secondary | ICD-10-CM | POA: Diagnosis not present

## 2023-12-18 DIAGNOSIS — I130 Hypertensive heart and chronic kidney disease with heart failure and stage 1 through stage 4 chronic kidney disease, or unspecified chronic kidney disease: Secondary | ICD-10-CM | POA: Diagnosis not present

## 2023-12-18 DIAGNOSIS — N184 Chronic kidney disease, stage 4 (severe): Secondary | ICD-10-CM | POA: Diagnosis not present

## 2023-12-19 DIAGNOSIS — E785 Hyperlipidemia, unspecified: Secondary | ICD-10-CM | POA: Diagnosis not present

## 2023-12-19 DIAGNOSIS — I130 Hypertensive heart and chronic kidney disease with heart failure and stage 1 through stage 4 chronic kidney disease, or unspecified chronic kidney disease: Secondary | ICD-10-CM | POA: Diagnosis not present

## 2023-12-19 DIAGNOSIS — I5042 Chronic combined systolic (congestive) and diastolic (congestive) heart failure: Secondary | ICD-10-CM | POA: Diagnosis not present

## 2023-12-19 DIAGNOSIS — I10 Essential (primary) hypertension: Secondary | ICD-10-CM | POA: Diagnosis not present

## 2023-12-19 DIAGNOSIS — E1169 Type 2 diabetes mellitus with other specified complication: Secondary | ICD-10-CM | POA: Diagnosis not present

## 2023-12-19 DIAGNOSIS — D849 Immunodeficiency, unspecified: Secondary | ICD-10-CM | POA: Diagnosis not present

## 2023-12-19 DIAGNOSIS — R338 Other retention of urine: Secondary | ICD-10-CM | POA: Diagnosis not present

## 2023-12-19 DIAGNOSIS — I872 Venous insufficiency (chronic) (peripheral): Secondary | ICD-10-CM | POA: Diagnosis not present

## 2023-12-19 DIAGNOSIS — N401 Enlarged prostate with lower urinary tract symptoms: Secondary | ICD-10-CM | POA: Diagnosis not present

## 2023-12-19 DIAGNOSIS — Z0000 Encounter for general adult medical examination without abnormal findings: Secondary | ICD-10-CM | POA: Diagnosis not present

## 2023-12-19 DIAGNOSIS — N179 Acute kidney failure, unspecified: Secondary | ICD-10-CM | POA: Diagnosis not present

## 2023-12-19 DIAGNOSIS — Z466 Encounter for fitting and adjustment of urinary device: Secondary | ICD-10-CM | POA: Diagnosis not present

## 2023-12-19 DIAGNOSIS — N184 Chronic kidney disease, stage 4 (severe): Secondary | ICD-10-CM | POA: Diagnosis not present

## 2023-12-19 DIAGNOSIS — Z5181 Encounter for therapeutic drug level monitoring: Secondary | ICD-10-CM | POA: Diagnosis not present

## 2023-12-19 DIAGNOSIS — E1122 Type 2 diabetes mellitus with diabetic chronic kidney disease: Secondary | ICD-10-CM | POA: Diagnosis not present

## 2023-12-19 DIAGNOSIS — N400 Enlarged prostate without lower urinary tract symptoms: Secondary | ICD-10-CM | POA: Diagnosis not present

## 2023-12-19 DIAGNOSIS — Z131 Encounter for screening for diabetes mellitus: Secondary | ICD-10-CM | POA: Diagnosis not present

## 2023-12-19 DIAGNOSIS — D631 Anemia in chronic kidney disease: Secondary | ICD-10-CM | POA: Diagnosis not present

## 2023-12-19 DIAGNOSIS — Z4822 Encounter for aftercare following kidney transplant: Secondary | ICD-10-CM | POA: Diagnosis not present

## 2023-12-19 DIAGNOSIS — S4292XD Fracture of left shoulder girdle, part unspecified, subsequent encounter for fracture with routine healing: Secondary | ICD-10-CM | POA: Diagnosis not present

## 2023-12-19 DIAGNOSIS — Z940 Kidney transplant status: Secondary | ICD-10-CM | POA: Diagnosis not present

## 2023-12-19 NOTE — Telephone Encounter (Addendum)
-----   Message from Emery Cranston [place], MD sent at 12/19/2023  3:53 PM EDT ----- Serum Cr lower than before. Tacrolimus  level above acceptable range of 4-6 if true trough level, if he is on tacrolimus  2 mg in AM and 1 mg in PM, please decrease tacrolimus  dose to 1 mg bid and  repeat tacrolimus  level next week.  Called pt and he confirmed trough. He will change FK to 1/1 and recheck labs in [place]. Will place orders. Electronically signed by: Powell LITTIE Agent, RN 12/19/2023 4:07 PM    ----- Message ----- From: Lab, Background User Sent: 12/19/2023  12:46 PM EDT To: Emery Garnell Cranston [place], MD

## 2023-12-22 ENCOUNTER — Inpatient Hospital Stay

## 2023-12-22 ENCOUNTER — Ambulatory Visit: Admitting: Physician Assistant

## 2023-12-22 ENCOUNTER — Other Ambulatory Visit: Payer: Self-pay

## 2023-12-22 ENCOUNTER — Ambulatory Visit (INDEPENDENT_AMBULATORY_CARE_PROVIDER_SITE_OTHER): Admitting: Physician Assistant

## 2023-12-22 ENCOUNTER — Encounter: Payer: Self-pay | Admitting: Physician Assistant

## 2023-12-22 VITALS — BP 139/87 | HR 75 | Temp 97.0°F | Resp 18

## 2023-12-22 VITALS — BP 116/67 | HR 69 | Ht 69.5 in | Wt 246.9 lb

## 2023-12-22 DIAGNOSIS — Z466 Encounter for fitting and adjustment of urinary device: Secondary | ICD-10-CM | POA: Diagnosis not present

## 2023-12-22 DIAGNOSIS — E291 Testicular hypofunction: Secondary | ICD-10-CM | POA: Diagnosis not present

## 2023-12-22 DIAGNOSIS — D509 Iron deficiency anemia, unspecified: Secondary | ICD-10-CM | POA: Diagnosis not present

## 2023-12-22 DIAGNOSIS — S4292XD Fracture of left shoulder girdle, part unspecified, subsequent encounter for fracture with routine healing: Secondary | ICD-10-CM | POA: Diagnosis not present

## 2023-12-22 DIAGNOSIS — Z803 Family history of malignant neoplasm of breast: Secondary | ICD-10-CM | POA: Diagnosis not present

## 2023-12-22 DIAGNOSIS — Z79899 Other long term (current) drug therapy: Secondary | ICD-10-CM | POA: Diagnosis not present

## 2023-12-22 DIAGNOSIS — D631 Anemia in chronic kidney disease: Secondary | ICD-10-CM | POA: Diagnosis not present

## 2023-12-22 DIAGNOSIS — N451 Epididymitis: Secondary | ICD-10-CM | POA: Diagnosis not present

## 2023-12-22 DIAGNOSIS — N184 Chronic kidney disease, stage 4 (severe): Secondary | ICD-10-CM | POA: Diagnosis not present

## 2023-12-22 DIAGNOSIS — E1122 Type 2 diabetes mellitus with diabetic chronic kidney disease: Secondary | ICD-10-CM | POA: Diagnosis not present

## 2023-12-22 DIAGNOSIS — Z800 Family history of malignant neoplasm of digestive organs: Secondary | ICD-10-CM | POA: Diagnosis not present

## 2023-12-22 DIAGNOSIS — Z940 Kidney transplant status: Secondary | ICD-10-CM | POA: Diagnosis not present

## 2023-12-22 DIAGNOSIS — Z87891 Personal history of nicotine dependence: Secondary | ICD-10-CM | POA: Diagnosis not present

## 2023-12-22 DIAGNOSIS — D508 Other iron deficiency anemias: Secondary | ICD-10-CM

## 2023-12-22 DIAGNOSIS — N179 Acute kidney failure, unspecified: Secondary | ICD-10-CM | POA: Diagnosis not present

## 2023-12-22 DIAGNOSIS — R338 Other retention of urine: Secondary | ICD-10-CM | POA: Diagnosis not present

## 2023-12-22 DIAGNOSIS — I130 Hypertensive heart and chronic kidney disease with heart failure and stage 1 through stage 4 chronic kidney disease, or unspecified chronic kidney disease: Secondary | ICD-10-CM | POA: Diagnosis not present

## 2023-12-22 DIAGNOSIS — N401 Enlarged prostate with lower urinary tract symptoms: Secondary | ICD-10-CM | POA: Diagnosis not present

## 2023-12-22 DIAGNOSIS — R339 Retention of urine, unspecified: Secondary | ICD-10-CM | POA: Diagnosis not present

## 2023-12-22 DIAGNOSIS — I5042 Chronic combined systolic (congestive) and diastolic (congestive) heart failure: Secondary | ICD-10-CM | POA: Diagnosis not present

## 2023-12-22 LAB — URINALYSIS, COMPLETE
Bilirubin, UA: NEGATIVE
Ketones, UA: NEGATIVE
Leukocytes,UA: NEGATIVE
Nitrite, UA: NEGATIVE
Protein,UA: NEGATIVE
RBC, UA: NEGATIVE
Specific Gravity, UA: 1.015 (ref 1.005–1.030)
Urobilinogen, Ur: 0.2 mg/dL (ref 0.2–1.0)
pH, UA: 6 (ref 5.0–7.5)

## 2023-12-22 LAB — MICROSCOPIC EXAMINATION: RBC, Urine: NONE SEEN /HPF (ref 0–2)

## 2023-12-22 LAB — BLADDER SCAN AMB NON-IMAGING

## 2023-12-22 MED ORDER — XYOSTED 75 MG/0.5ML ~~LOC~~ SOAJ
0.5000 mL | SUBCUTANEOUS | 0 refills | Status: DC
  Filled 2023-12-22: qty 2, 28d supply, fill #0

## 2023-12-22 MED ORDER — IRON SUCROSE 20 MG/ML IV SOLN
200.0000 mg | INTRAVENOUS | Status: DC
  Administered 2023-12-22: 200 mg via INTRAVENOUS
  Filled 2023-12-22: qty 10

## 2023-12-22 MED ORDER — SULFAMETHOXAZOLE-TRIMETHOPRIM 800-160 MG PO TABS
1.0000 | ORAL_TABLET | Freq: Two times a day (BID) | ORAL | 0 refills | Status: AC
  Filled 2023-12-22: qty 20, 10d supply, fill #0

## 2023-12-22 NOTE — Patient Instructions (Signed)

## 2023-12-22 NOTE — Progress Notes (Signed)
 12/22/2023 5:33 PM   [name] [birth year] [id number]  CC: Chief Complaint  Patient presents with   Genital pain   HPI: [name]. is a 63 y.o. male with PMH diabetes on Farxiga , BPH with LUTS s/p UroLift in October 2023, and hypogonadism on testosterone  gel with a recent history of urinary retention after undergoing anal wart ablation with hemorrhoidectomy/hemorrhoid ligation with Dr. Sheldon who presents today for voiding trial.   Catheter was placed in the ED on POD 2.  Dr. Sheldon' team removed it on 12/02/2023.  Today he reports he has been voiding without difficulty since Foley removal.  He has noticed a week of left testicular swelling and tenderness consistent with the past episode of epididymitis that I treated.  He also wonders about alternative formulations of testosterone .  He was unable to tolerate IM injections due to pain.  He finds the gel to be somewhat messy.  In office UA with 2+ glucose; urine microscopy with granular casts.  PVR 78 mL.  PMH: Past Medical History:  Diagnosis Date   Anal condyloma    Anemia    Anxiety    Aortic atherosclerosis    Aortic stenosis    a.) TTE 10/2021: Mod AS. AVA 1.06cm^2 (VTI). Mean grad ; b.) TTE 04/02/2022: no AV stenosis   Asthma, persistent controlled 02/25/2013   Attention or concentration deficit 04/26/2021   BPH with obstruction/lower urinary tract symptoms 09/25/2014   CAD (coronary artery disease) 2005   a.) LHC 1996 -> HG stenosis mLAD -> PTCA/PCI with stent x 1 (unk type) -> complicated by CFA pseudoanurysm (required surg);  b.) LHC 08/10/2002  -> 95% LAD, 70% ISR LAD, 80% pOM, 70% mRCA --> CVTS consult; c.) 4v CABG 08/13/2002; c.) 03/2018 MV: Small, fixed inferoapical and apical sep defect. No isc. EF 47% (60-65% by 05/2018 TTE); d.) 02/2021 MV: small Apical inf fixed defect. No isc -> low risk.   Cardiomegaly    Cellulitis and abscess of left leg 07/22/2020   Chronic atrial fibrillation     a.) CHA2DS2VASc = 4 (CHF, HTN, vascular disease history, T2DM);  b.) rate/rhythm maintained without pharmacological intervention; chronically anticoagulated with apixaban    Chronic combined systolic and diastolic CHF (congestive heart failure)    a.) TTE 7/18 : EF 45-50%; b.) TTE 05/2018 : EF 60-65%, RVSP 74.8; c.) TTE 12/2018: EF 50-55%. Sev dil LA; d.) TTE 04/2019: EF 45-50%; e.) TTE 07/2021: EF 40-45%, G3DD; f.) TTE 10/2021: EF 40-45%, mild conc LVH, septal-lat dyssynchrony (LBBB), mildly red RVSF, mild-mod dil LA, mildly dil RA, mild-mod MS, mod AS; g.) TTE 04/02/2022: EF 40-45%, glob HK, LVH, red RVSF, RVE, sev LAE, mild-mod RAE, mild MR   Chronic venous stasis dermatitis of both lower extremities    Complicated UTI (urinary tract infection) 06/24/2022   Cytomegaloviral disease 2017   Diverticulosis of colon 04/22/2013   Elevated RIGHT hemidiaphragm    Erectile dysfunction    a.) on topical TRT + Trimix (alprostadil/papaverine/phentolamine) injections   ESRD (end stage renal disease) (HCC)    a.) dialysis dependent 2004-2012; b.) s/p cadaveric RIGHT renal transplant 09/13/2010   Essential hypertension 12/17/2010   Gout    History of 2019 novel coronavirus disease (COVID-19) 06/30/2019   History of bilateral cataract extraction 10/2017   History of renal transplant 09/13/2010   a.) s/p cadaveric donor transplant 09/13/2010   Hx of bilateral cataract extraction 10/2017   Hyperlipidemia    Hypogonadism in male 09/25/2014   Ischemic  cardiomyopathy    Left cephalic vein thrombosis 06/2019   Long term current use of anticoagulant    a.) apixaban    Long term current use of immunosuppressive drug    a.) mycophenolate  + prednisone  + tacrolimus    Major depressive disorder 10/04/2013   Mitral stenosis 11/07/2021   a.) TTE 11/07/2021: severe MAC, mild-mod MS (MPG 6 mmHg); b.) TTE 04/02/2022: no MV stenosis   Murmur    Obesity hypoventilation syndrome 03/31/2012   OSA treated with BiPAP  09/29/2012   PAH (pulmonary artery hypertension)    a. 04/2019 RHC: RA 19, RV 80/20, PA 78/31 (51), PCWP 25, CO/CI 7.69/3.26. PVR 3.25 --> Sev PAH, likely primarily PV HTN; b.) RHC 04/04/2022: mRA 13, mPA 40, mPCWP 20, PA sat 59, AO sat 92, CO 7.63, CI 3.26, PVR 2.6 --> mod portal venous HTN   Pancreatitis    S/P CABG x 4 08/13/2002   a.) LIMA-LAD, LRA-OM, SVG-D1, SVG-dRCA   S/P gastric bypass    Synovitis of finger 06/11/2019   Trigger finger, unspecified little finger 12/23/2018   Type 2 diabetes mellitus with hyperglycemia, with long-term current use of insulin  09/09/2014   a.) has FreeStyle Libre CGM   Ulcer    Left shin goes to wound care center at [name] qweek   Wears glasses    Wears hearing aid in both ears     Surgical History: Past Surgical History:  Procedure Laterality Date   CARDIAC CATHETERIZATION N/A 08/10/2002   CATARACT EXTRACTION W/PHACO Right 10/29/2017   Procedure: CATARACT EXTRACTION PHACO AND INTRAOCULAR LENS PLACEMENT (IOC)  RIGHT DIABETIC;  Surgeon: Mittie Gaskin, MD;  Location: [facility];  Service: Ophthalmology;  Laterality: Right   CATARACT EXTRACTION W/PHACO Left 11/18/2017   Procedure: CATARACT EXTRACTION PHACO AND INTRAOCULAR LENS PLACEMENT (IOC) LEFT IVA/TOPICAL;  Surgeon: Mittie Gaskin, MD;  Location: [facility];  Service: Ophthalmology;  Laterality: Left   COLONOSCOPY WITH PROPOFOL  N/A 04/22/2013   Procedure: COLONOSCOPY WITH PROPOFOL ;  Surgeon: Toribio SHAUNNA Cedar, MD;  Location: [facility];  Service: Endoscopy;  Laterality: N/A;   CORONARY ANGIOPLASTY WITH STENT PLACEMENT N/A 1996   CORONARY ARTERY BYPASS GRAFT N/A 08/13/2002   Procedure: 4v CORONARY ARTERY BYPASS GRAFTING; Location: [facility]; Surgeon: Dessa Laine, MD   CYSTOSCOPY WITH INSERTION OF UROLIFT N/A 12/25/2021   Procedure: CYSTOSCOPY WITH INSERTION OF UROLIFT;  Surgeon: Twylla Glendia BROCKS, MD;  Location: [facility];  Service: Urology;  Laterality: N/A;   DG  ANGIO AV SHUNT*L*     right and left upper arms   FASCIOTOMY  03/03/2012   Procedure: FASCIOTOMY;  Surgeon: Arley JONELLE Curia, MD;  Location: [facility];  Service: Orthopedics;  Laterality: Right;  FASCIOTOMY RIGHT SMALL FINGER   FASCIOTOMY Left 08/17/2013   Procedure: FASCIOTOMY LEFT RING;  Surgeon: Arley JONELLE Curia, MD;  Location: [facility];  Service: Orthopedics;  Laterality: Left;   INCISION AND DRAINAGE ABSCESS Left 10/15/2015   Procedure: INCISION AND DRAINAGE ABSCESS;  Surgeon: Laneta JULIANNA Luna, MD;  Location: [facility];  Service: General;  Laterality: Left;   KIDNEY TRANSPLANT  09/13/2010   cadaver--at [facility]   LASER ABLATION CONDOLAMATA N/A 01/08/2023   Procedure: LASER REMOVAL ABLATION OF CONDYLOMATA;  Surgeon: Sheldon Standing, MD;  Location: [facility];  Service: General;  Laterality: N/A;  GEN AND LOCAL   LASER ABLATION CONDOLAMATA N/A 11/20/2023   Procedure: ABLATION, CONDYLOMA, USING LASER HEMORRHOIDAL LIGATION & HEMORRHOIDOPEXY, HEMORRHOIDECTOMY x 2;  Surgeon: Sheldon Standing, MD;  Location:  [facility];  Service: General;  Laterality: N/A;  LASER REMOVAL ABLATION OF CONDYLOMATA ANORECTAL EXAMINATION UNDER ANESTHESIA   RECTAL EXAM UNDER ANESTHESIA N/A 01/08/2023   Procedure: ANORECTAL EXAM UNDER ANESTHESIA;  Surgeon: Sheldon Standing, MD;  Location: [facility];  Service: General;  Laterality: N/A;   RECTAL EXAM UNDER ANESTHESIA N/A 11/20/2023   Procedure: EXAM UNDER ANESTHESIA, RECTUM;  Surgeon: Sheldon Standing, MD;  Location: [facility];  Service: General;  Laterality: N/A;   RIGHT HEART CATH N/A 11/15/2016   Procedure: RIGHT HEART CATH;  Surgeon: Darron Deatrice LABOR, MD;  Location: [facility];  Service: Cardiovascular;  Laterality: N/A;   RIGHT HEART CATH N/A 05/03/2019   Procedure: RIGHT HEART CATH;  Surgeon: [name], MD;  Location: [facility];  Service: Cardiovascular;  Laterality: N/A;   RIGHT HEART CATH N/A 04/04/2022   Procedure: RIGHT HEART CATH;   Surgeon: [name], MD;  Location: [facility] CV [facility];  Service: Cardiovascular;  Laterality: N/A;   ROUX-EN-Y GASTRIC BYPASS N/A 2015   TYMPANIC MEMBRANE REPAIR Left 03/2010   VASECTOMY     WART FULGURATION N/A 05/31/2022   Procedure: FULGURATION ANAL WART;  Surgeon: Tye Millet, DO;  Location: [facility];  Service: General;  Laterality: N/A;    Home Medications:  Allergies as of 12/22/2023       Reactions   Iodinated Contrast Media Other (See Comments)   Kidney transplant Iodinated contrast media (substance)   Iodine Other (See Comments)   Kidney transplant   Nsaids Other (See Comments)   Kidney transplant Kidney transplant    Non-steroidal anti-inflammatory agent (substance)   Other Other (See Comments)   Non-steroidal anti-inflammatory agent (substance)   Ibuprofen Other (See Comments)   Due to kidney transplant        Medication List        Accurate as of December 22, 2023  5:33 PM. If you have any questions, ask your nurse or doctor.          albuterol  (2.5 MG/3ML) 0.083% nebulizer solution Commonly known as: PROVENTIL  Inhale one vial (3 ml) via nebulizer every 6 hours as needed for wheezing or shortness of breath (Take 3 mLs (2.5 mg total) by nebulization every 6 (six) hours as needed for wheezing or shortness of breath.)   albuterol  108 (90 Base) MCG/ACT inhaler Commonly known as: VENTOLIN  HFA Inhale 2 puffs into the lungs every 6 (six) hours as needed for wheezing or shortness of breath.   amoxicillin  500 MG capsule Commonly known as: AMOXIL  Take 4 capsules (2,000) 30-60 min prior to dental appointment.   apixaban  5 MG Tabs tablet Commonly known as: Eliquis  Take 1 tablet (5 mg total) by mouth 2 (two) times daily.   B-12 PO Take 1 tablet by mouth daily.   buPROPion  150 MG 12 hr tablet Commonly known as: WELLBUTRIN  SR Take 1 tablet (150 mg total) by mouth 2 (two) times daily.   colchicine  0.6 MG tablet Take 0.6 mg by mouth every other day.    diazepam  5 MG tablet Commonly known as: VALIUM  Take 1 tablet (5 mg total) by mouth every 8 (eight) hours as needed for muscle spasms (difficulty urinating).   Farxiga  10 MG Tabs tablet Generic drug: dapagliflozin  propanediol Take 1 tablet (10 mg total) by mouth daily before breakfast.   febuxostat  40 MG tablet Commonly known as: ULORIC  Take 3 tablets (120 mg total) by mouth daily.   fluticasone  50 MCG/ACT nasal spray Commonly known as:  FLONASE  Place 2 sprays into both nostrils daily. What changed:  when to take this reasons to take this   FreeStyle Libre 3 Plus Sensor Misc Use 1 each every 15 (fifteen) days   FT Zinc  Chelated 50 MG Tabs Take 1 tablet (50 mg total) by mouth daily.   gabapentin  300 MG capsule Commonly known as: NEURONTIN  Take 1 capsule (300 mg total) by mouth 2 (two) times daily.   hydrALAZINE  25 MG tablet Commonly known as: APRESOLINE  Take 25 mg by mouth.   HYDROcodone -acetaminophen  10-325 MG tablet Commonly known as: NORCO TAKE 1 TABLET BY MOUTH EVERY 4 HOURS FOR 3 DAYS AS NEEDED FOR PAIN   lisinopril  40 MG tablet Commonly known as: ZESTRIL  Take 40 mg by mouth.   montelukast  10 MG tablet Commonly known as: SINGULAIR  Take 1 tablet (10 mg total) by mouth at bedtime.   Mounjaro  7.5 MG/0.5ML Pen Generic drug: tirzepatide  Inject 7.5 mg into the skin once a week.   mycophenolate  180 MG EC tablet Commonly known as: MYFORTIC  Take 2 tablets (360 mg total) by mouth every morning AND 1 tablet (180 mg total) every evening.   omeprazole  20 MG capsule Commonly known as: PRILOSEC Take 1 capsule (20 mg total) by mouth daily.   oxyCODONE -acetaminophen  10-325 MG tablet Commonly known as: PERCOCET Take 1 tablet by mouth every 6 (six) hours as needed for up to 5 days.   predniSONE  5 MG tablet Commonly known as: DELTASONE  Take 1 tablet (5 mg total) by mouth daily.   rosuvastatin  10 MG tablet Commonly known as: CRESTOR  Take 1 tablet (10 mg total) by  mouth daily.   Semglee  (yfgn) 100 UNIT/ML Pen Generic drug: insulin  glargine-yfgn Inject 20 Units into the skin daily. What changed: how much to take   insulin  glargine-yfgn 100 UNIT/ML Pen Commonly known as: SEMGLEE  Inject 20 Units into the skin daily. What changed: Another medication with the same name was changed. Make sure you understand how and when to take each.   insulin  glargine-yfgn 100 UNIT/ML Pen Commonly known as: SEMGLEE  Inject 24 Units subcutaneously once daily. What changed: Another medication with the same name was changed. Make sure you understand how and when to take each.   sodium chloride  0.65 % Soln nasal spray Commonly known as: OCEAN Place 2 sprays into both nostrils every 8 (eight) hours for 10 days. What changed:  when to take this reasons to take this   sulfamethoxazole -trimethoprim  800-160 MG tablet Commonly known as: BACTRIM  DS Take 1 tablet by mouth 2 (two) times daily for 10 days. Started by: Hasset Chaviano   tacrolimus  1 MG capsule Commonly known as: PROGRAF  Take 1 mg by mouth.   tadalafil  5 MG tablet Commonly known as: CIALIS  Take 5 mg by mouth daily as needed for erectile dysfunction.   tamsulosin  0.4 MG Caps capsule Commonly known as: FLOMAX  Take 1 capsule (0.4 mg total) by mouth daily.   torsemide  100 MG tablet Commonly known as: DEMADEX  Take 100 mg by mouth.   traMADol  50 MG tablet Commonly known as: ULTRAM  Take One tab PO Q6 hours PRN pain   Unifine Pentips 31G X 5 MM Misc Generic drug: Insulin  Pen Needle Use 4 times daily as directed   Insupen Pen Needles 31G X 5 MM Misc Generic drug: Insulin  Pen Needle 4 (four) times daily.        Allergies:  Allergies  Allergen Reactions   Iodinated Contrast Media Other (See Comments)    Kidney transplant  Iodinated contrast media (  substance)   Iodine Other (See Comments)    Kidney transplant   Nsaids Other (See Comments)    Kidney transplant  Kidney transplant     Non-steroidal anti-inflammatory agent (substance)   Other Other (See Comments)    Non-steroidal anti-inflammatory agent (substance)   Ibuprofen Other (See Comments)    Due to kidney transplant    Family History: Family History  Problem Relation Age of Onset   Heart disease Father    Kidney failure Father    Kidney disease Father    Diabetes Maternal Grandmother    Breast cancer Maternal Grandmother    Valvular heart disease Mother    Liver cancer Paternal Uncle    Liver cancer Paternal Grandmother    Prostate cancer Neg Hx     Social History:   reports that he has never smoked. He has never used smokeless tobacco. He reports that he does not currently use alcohol. He reports that he does not use drugs.  Physical Exam: BP 116/67 (BP Location: Right Arm, Patient Position: Sitting, Cuff Size: Large)   Pulse 69   Ht 5' 9.5 (1.765 m)   Wt 246 lb 14.4 oz (112 kg)   BMI 35.94 kg/m   Constitutional:  Alert and oriented, no acute distress, nontoxic appearing HEENT: [place], AT Cardiovascular: No clubbing, cyanosis, or edema Respiratory: Normal respiratory effort, no increased work of breathing GU: Bilateral descended testicles.  Left epididymis is somewhat enlarged and tender.  No scrotal edema, erythema, fluctuance, or crepitus. Skin: No rashes, bruises or suspicious lesions Neurologic: Grossly intact, no focal deficits, moving all 4 extremities Psychiatric: Normal mood and affect  Laboratory Data: Results for orders placed or performed in visit on 12/22/23  BLADDER SCAN AMB NON-IMAGING   Collection Time: 12/22/23  9:27 AM  Result Value Ref Range   Scan Result 78ml   Microscopic Examination   Collection Time: 12/22/23  9:40 AM   Urine  Result Value Ref Range   WBC, UA 0-5 0 - 5 /hpf   RBC, Urine None seen 0 - 2 /hpf   Epithelial Cells (non renal) 0-10 0 - 10 /hpf   Casts Present (A) None seen /lpf   Cast Type Granular casts (A) N/A   Mucus, UA Present (A) Not Estab.    Bacteria, UA Few None seen/Few  Urinalysis, Complete   Collection Time: 12/22/23  9:40 AM  Result Value Ref Range   Specific Gravity, UA 1.015 1.005 - 1.030   pH, UA 6.0 5.0 - 7.5   Color, UA Yellow Yellow   Appearance Ur Clear Clear   Leukocytes,UA Negative Negative   Protein,UA Negative Negative/Trace   Glucose, UA 2+ (A) Negative   Ketones, UA Negative Negative   RBC, UA Negative Negative   Bilirubin, UA Negative Negative   Urobilinogen, Ur 0.2 0.2 - 1.0 mg/dL   Nitrite, UA Negative Negative   Microscopic Examination Comment    Microscopic Examination See below:    *Note: Due to a large number of results and/or encounters for the requested time period, some results have not been displayed. A complete set of results can be found in Results Review.   Assessment & Plan:   1. Urinary retention (Primary) Foley was removed about 3 weeks ago.  He is emptying appropriately.  We discussed that this seems to have been isolated postop urinary retention. - BLADDER SCAN AMB NON-IMAGING  2. Left epididymitis Left epididymis is somewhat enlarged and tender today consistent with epididymitis in the setting of  recent Foley.  Will start empiric Bactrim  and send urine for culture. - Urinalysis, Complete - CULTURE, URINE COMPREHENSIVE - sulfamethoxazole -trimethoprim  (BACTRIM  DS) 800-160 MG tablet; Take 1 tablet by mouth 2 (two) times daily for 10 days.  Dispense: 20 tablet; Refill: 0  3. Hypogonadism in male On testosterone  gel, which has been messy.  Unable to tolerate IM injections.  Will see if we can get prior Auth for [place]. - Testosterone  Enanthate (XYOSTED) 75 MG/0.5ML SOAJ; Inject 0.5 mLs into the skin once a week.  Dispense: 3 mL; Refill: 0    Return if symptoms worsen or fail to improve.  Lucie Hones, PA-C  [facility] [place] [address], [address] [place], [place] [address] [phone]

## 2023-12-23 ENCOUNTER — Other Ambulatory Visit: Payer: Self-pay

## 2023-12-23 ENCOUNTER — Encounter: Attending: Physician Assistant | Admitting: Physician Assistant

## 2023-12-23 ENCOUNTER — Telehealth: Payer: Self-pay

## 2023-12-23 DIAGNOSIS — N401 Enlarged prostate with lower urinary tract symptoms: Secondary | ICD-10-CM | POA: Diagnosis not present

## 2023-12-23 DIAGNOSIS — Z466 Encounter for fitting and adjustment of urinary device: Secondary | ICD-10-CM | POA: Diagnosis not present

## 2023-12-23 DIAGNOSIS — E1122 Type 2 diabetes mellitus with diabetic chronic kidney disease: Secondary | ICD-10-CM | POA: Diagnosis not present

## 2023-12-23 DIAGNOSIS — S61411A Laceration without foreign body of right hand, initial encounter: Secondary | ICD-10-CM | POA: Insufficient documentation

## 2023-12-23 DIAGNOSIS — I5042 Chronic combined systolic (congestive) and diastolic (congestive) heart failure: Secondary | ICD-10-CM | POA: Insufficient documentation

## 2023-12-23 DIAGNOSIS — I130 Hypertensive heart and chronic kidney disease with heart failure and stage 1 through stage 4 chronic kidney disease, or unspecified chronic kidney disease: Secondary | ICD-10-CM | POA: Diagnosis not present

## 2023-12-23 DIAGNOSIS — R338 Other retention of urine: Secondary | ICD-10-CM | POA: Diagnosis not present

## 2023-12-23 DIAGNOSIS — I87333 Chronic venous hypertension (idiopathic) with ulcer and inflammation of bilateral lower extremity: Secondary | ICD-10-CM | POA: Diagnosis not present

## 2023-12-23 DIAGNOSIS — Z940 Kidney transplant status: Secondary | ICD-10-CM | POA: Insufficient documentation

## 2023-12-23 DIAGNOSIS — D631 Anemia in chronic kidney disease: Secondary | ICD-10-CM | POA: Diagnosis not present

## 2023-12-23 DIAGNOSIS — X58XXXA Exposure to other specified factors, initial encounter: Secondary | ICD-10-CM | POA: Diagnosis not present

## 2023-12-23 DIAGNOSIS — I480 Paroxysmal atrial fibrillation: Secondary | ICD-10-CM | POA: Diagnosis not present

## 2023-12-23 DIAGNOSIS — E11622 Type 2 diabetes mellitus with other skin ulcer: Secondary | ICD-10-CM | POA: Insufficient documentation

## 2023-12-23 DIAGNOSIS — I890 Lymphedema, not elsewhere classified: Secondary | ICD-10-CM | POA: Diagnosis not present

## 2023-12-23 DIAGNOSIS — Z7901 Long term (current) use of anticoagulants: Secondary | ICD-10-CM | POA: Insufficient documentation

## 2023-12-23 DIAGNOSIS — L97822 Non-pressure chronic ulcer of other part of left lower leg with fat layer exposed: Secondary | ICD-10-CM | POA: Insufficient documentation

## 2023-12-23 DIAGNOSIS — L97812 Non-pressure chronic ulcer of other part of right lower leg with fat layer exposed: Secondary | ICD-10-CM | POA: Insufficient documentation

## 2023-12-23 DIAGNOSIS — S4292XD Fracture of left shoulder girdle, part unspecified, subsequent encounter for fracture with routine healing: Secondary | ICD-10-CM | POA: Diagnosis not present

## 2023-12-23 DIAGNOSIS — N179 Acute kidney failure, unspecified: Secondary | ICD-10-CM | POA: Diagnosis not present

## 2023-12-23 DIAGNOSIS — I110 Hypertensive heart disease with heart failure: Secondary | ICD-10-CM | POA: Diagnosis not present

## 2023-12-23 DIAGNOSIS — N184 Chronic kidney disease, stage 4 (severe): Secondary | ICD-10-CM | POA: Diagnosis not present

## 2023-12-23 NOTE — Telephone Encounter (Signed)
 Spoke with the patient's wife who states that the patient has spoken to his nephrologist about risks of watchman implant and worsening renal function. Patient has decided that he would like to proceed with watchman. Aware that he will be contacted to schedule.

## 2023-12-24 ENCOUNTER — Other Ambulatory Visit: Payer: Self-pay

## 2023-12-24 DIAGNOSIS — I5042 Chronic combined systolic (congestive) and diastolic (congestive) heart failure: Secondary | ICD-10-CM | POA: Diagnosis not present

## 2023-12-24 DIAGNOSIS — I10 Essential (primary) hypertension: Secondary | ICD-10-CM | POA: Diagnosis not present

## 2023-12-24 DIAGNOSIS — E11622 Type 2 diabetes mellitus with other skin ulcer: Secondary | ICD-10-CM | POA: Diagnosis not present

## 2023-12-24 DIAGNOSIS — E1122 Type 2 diabetes mellitus with diabetic chronic kidney disease: Secondary | ICD-10-CM | POA: Diagnosis not present

## 2023-12-24 DIAGNOSIS — S4292XD Fracture of left shoulder girdle, part unspecified, subsequent encounter for fracture with routine healing: Secondary | ICD-10-CM | POA: Diagnosis not present

## 2023-12-24 DIAGNOSIS — R338 Other retention of urine: Secondary | ICD-10-CM | POA: Diagnosis not present

## 2023-12-24 DIAGNOSIS — S81809A Unspecified open wound, unspecified lower leg, initial encounter: Secondary | ICD-10-CM | POA: Diagnosis not present

## 2023-12-24 DIAGNOSIS — N184 Chronic kidney disease, stage 4 (severe): Secondary | ICD-10-CM | POA: Diagnosis not present

## 2023-12-24 DIAGNOSIS — Z466 Encounter for fitting and adjustment of urinary device: Secondary | ICD-10-CM | POA: Diagnosis not present

## 2023-12-24 DIAGNOSIS — S81812A Laceration without foreign body, left lower leg, initial encounter: Secondary | ICD-10-CM | POA: Diagnosis not present

## 2023-12-24 DIAGNOSIS — N401 Enlarged prostate with lower urinary tract symptoms: Secondary | ICD-10-CM | POA: Diagnosis not present

## 2023-12-24 DIAGNOSIS — N179 Acute kidney failure, unspecified: Secondary | ICD-10-CM | POA: Diagnosis not present

## 2023-12-24 DIAGNOSIS — I890 Lymphedema, not elsewhere classified: Secondary | ICD-10-CM | POA: Diagnosis not present

## 2023-12-24 DIAGNOSIS — L97822 Non-pressure chronic ulcer of other part of left lower leg with fat layer exposed: Secondary | ICD-10-CM | POA: Diagnosis not present

## 2023-12-24 DIAGNOSIS — L97812 Non-pressure chronic ulcer of other part of right lower leg with fat layer exposed: Secondary | ICD-10-CM | POA: Diagnosis not present

## 2023-12-24 DIAGNOSIS — I87333 Chronic venous hypertension (idiopathic) with ulcer and inflammation of bilateral lower extremity: Secondary | ICD-10-CM | POA: Diagnosis not present

## 2023-12-24 DIAGNOSIS — I130 Hypertensive heart and chronic kidney disease with heart failure and stage 1 through stage 4 chronic kidney disease, or unspecified chronic kidney disease: Secondary | ICD-10-CM | POA: Diagnosis not present

## 2023-12-24 DIAGNOSIS — I480 Paroxysmal atrial fibrillation: Secondary | ICD-10-CM | POA: Diagnosis not present

## 2023-12-24 DIAGNOSIS — D631 Anemia in chronic kidney disease: Secondary | ICD-10-CM | POA: Diagnosis not present

## 2023-12-25 ENCOUNTER — Inpatient Hospital Stay

## 2023-12-25 ENCOUNTER — Ambulatory Visit: Payer: Self-pay | Admitting: Physician Assistant

## 2023-12-25 LAB — CULTURE, URINE COMPREHENSIVE

## 2023-12-26 NOTE — Telephone Encounter (Signed)
 The patient reports he spoke with his nephrologist and has weighed pros/cons of contrast during procedure. He wishes to proceed with LAAO.  Will schedule the patient for LAAO on 02/26/2024. Pre-procedure visit scheduled 11/17. He was grateful for call and agreed with plan.

## 2023-12-29 ENCOUNTER — Telehealth: Payer: Self-pay

## 2023-12-29 NOTE — Telephone Encounter (Signed)
 Prior Auth has been sent to [employer] via Cover My Meds. Awaiting Approval.

## 2023-12-30 ENCOUNTER — Encounter: Attending: Physician Assistant | Admitting: Physician Assistant

## 2023-12-30 ENCOUNTER — Encounter: Admitting: Sleep Medicine

## 2023-12-30 DIAGNOSIS — W228XXA Striking against or struck by other objects, initial encounter: Secondary | ICD-10-CM | POA: Diagnosis not present

## 2023-12-30 DIAGNOSIS — I5042 Chronic combined systolic (congestive) and diastolic (congestive) heart failure: Secondary | ICD-10-CM | POA: Insufficient documentation

## 2023-12-30 DIAGNOSIS — S4292XD Fracture of left shoulder girdle, part unspecified, subsequent encounter for fracture with routine healing: Secondary | ICD-10-CM | POA: Diagnosis not present

## 2023-12-30 DIAGNOSIS — E11622 Type 2 diabetes mellitus with other skin ulcer: Secondary | ICD-10-CM | POA: Diagnosis not present

## 2023-12-30 DIAGNOSIS — Z7901 Long term (current) use of anticoagulants: Secondary | ICD-10-CM | POA: Insufficient documentation

## 2023-12-30 DIAGNOSIS — S61411A Laceration without foreign body of right hand, initial encounter: Secondary | ICD-10-CM | POA: Insufficient documentation

## 2023-12-30 DIAGNOSIS — I890 Lymphedema, not elsewhere classified: Secondary | ICD-10-CM | POA: Diagnosis not present

## 2023-12-30 DIAGNOSIS — R338 Other retention of urine: Secondary | ICD-10-CM | POA: Diagnosis not present

## 2023-12-30 DIAGNOSIS — I110 Hypertensive heart disease with heart failure: Secondary | ICD-10-CM | POA: Diagnosis not present

## 2023-12-30 DIAGNOSIS — I480 Paroxysmal atrial fibrillation: Secondary | ICD-10-CM | POA: Insufficient documentation

## 2023-12-30 DIAGNOSIS — L97822 Non-pressure chronic ulcer of other part of left lower leg with fat layer exposed: Secondary | ICD-10-CM | POA: Insufficient documentation

## 2023-12-30 DIAGNOSIS — Z940 Kidney transplant status: Secondary | ICD-10-CM | POA: Diagnosis not present

## 2023-12-30 DIAGNOSIS — L97812 Non-pressure chronic ulcer of other part of right lower leg with fat layer exposed: Secondary | ICD-10-CM | POA: Diagnosis not present

## 2023-12-30 DIAGNOSIS — N401 Enlarged prostate with lower urinary tract symptoms: Secondary | ICD-10-CM | POA: Diagnosis not present

## 2023-12-30 DIAGNOSIS — E1122 Type 2 diabetes mellitus with diabetic chronic kidney disease: Secondary | ICD-10-CM | POA: Diagnosis not present

## 2023-12-30 DIAGNOSIS — N184 Chronic kidney disease, stage 4 (severe): Secondary | ICD-10-CM | POA: Diagnosis not present

## 2023-12-30 DIAGNOSIS — I87333 Chronic venous hypertension (idiopathic) with ulcer and inflammation of bilateral lower extremity: Secondary | ICD-10-CM | POA: Diagnosis not present

## 2023-12-30 DIAGNOSIS — I130 Hypertensive heart and chronic kidney disease with heart failure and stage 1 through stage 4 chronic kidney disease, or unspecified chronic kidney disease: Secondary | ICD-10-CM | POA: Diagnosis not present

## 2023-12-30 DIAGNOSIS — I10 Essential (primary) hypertension: Secondary | ICD-10-CM | POA: Diagnosis not present

## 2023-12-30 DIAGNOSIS — D631 Anemia in chronic kidney disease: Secondary | ICD-10-CM | POA: Diagnosis not present

## 2023-12-30 DIAGNOSIS — N179 Acute kidney failure, unspecified: Secondary | ICD-10-CM | POA: Diagnosis not present

## 2023-12-30 DIAGNOSIS — Z466 Encounter for fitting and adjustment of urinary device: Secondary | ICD-10-CM | POA: Diagnosis not present

## 2023-12-30 DIAGNOSIS — S81812A Laceration without foreign body, left lower leg, initial encounter: Secondary | ICD-10-CM | POA: Diagnosis not present

## 2023-12-30 DIAGNOSIS — S81809A Unspecified open wound, unspecified lower leg, initial encounter: Secondary | ICD-10-CM | POA: Diagnosis not present

## 2023-12-31 ENCOUNTER — Ambulatory Visit: Admitting: Physician Assistant

## 2023-12-31 ENCOUNTER — Other Ambulatory Visit: Payer: Self-pay

## 2023-12-31 ENCOUNTER — Other Ambulatory Visit: Payer: Self-pay | Admitting: Cardiology

## 2023-12-31 DIAGNOSIS — D631 Anemia in chronic kidney disease: Secondary | ICD-10-CM | POA: Diagnosis not present

## 2023-12-31 DIAGNOSIS — N184 Chronic kidney disease, stage 4 (severe): Secondary | ICD-10-CM | POA: Diagnosis not present

## 2023-12-31 DIAGNOSIS — N401 Enlarged prostate with lower urinary tract symptoms: Secondary | ICD-10-CM | POA: Diagnosis not present

## 2023-12-31 DIAGNOSIS — R338 Other retention of urine: Secondary | ICD-10-CM | POA: Diagnosis not present

## 2023-12-31 DIAGNOSIS — Z466 Encounter for fitting and adjustment of urinary device: Secondary | ICD-10-CM | POA: Diagnosis not present

## 2023-12-31 DIAGNOSIS — I5042 Chronic combined systolic (congestive) and diastolic (congestive) heart failure: Secondary | ICD-10-CM | POA: Diagnosis not present

## 2023-12-31 DIAGNOSIS — E1122 Type 2 diabetes mellitus with diabetic chronic kidney disease: Secondary | ICD-10-CM | POA: Diagnosis not present

## 2023-12-31 DIAGNOSIS — S4292XD Fracture of left shoulder girdle, part unspecified, subsequent encounter for fracture with routine healing: Secondary | ICD-10-CM | POA: Diagnosis not present

## 2023-12-31 DIAGNOSIS — N179 Acute kidney failure, unspecified: Secondary | ICD-10-CM | POA: Diagnosis not present

## 2023-12-31 DIAGNOSIS — I130 Hypertensive heart and chronic kidney disease with heart failure and stage 1 through stage 4 chronic kidney disease, or unspecified chronic kidney disease: Secondary | ICD-10-CM | POA: Diagnosis not present

## 2023-12-31 MED ORDER — DAPAGLIFLOZIN PROPANEDIOL 10 MG PO TABS
10.0000 mg | ORAL_TABLET | Freq: Every day | ORAL | 3 refills | Status: DC
  Filled 2023-12-31: qty 90, 90d supply, fill #0

## 2024-01-01 ENCOUNTER — Inpatient Hospital Stay: Attending: Oncology

## 2024-01-01 ENCOUNTER — Inpatient Hospital Stay

## 2024-01-01 ENCOUNTER — Other Ambulatory Visit: Payer: Self-pay

## 2024-01-01 ENCOUNTER — Other Ambulatory Visit: Payer: Self-pay | Admitting: Cardiology

## 2024-01-01 VITALS — BP 119/54 | HR 51 | Temp 97.9°F | Resp 16

## 2024-01-01 DIAGNOSIS — Z79899 Other long term (current) drug therapy: Secondary | ICD-10-CM | POA: Insufficient documentation

## 2024-01-01 DIAGNOSIS — Z940 Kidney transplant status: Secondary | ICD-10-CM | POA: Diagnosis not present

## 2024-01-01 DIAGNOSIS — D509 Iron deficiency anemia, unspecified: Secondary | ICD-10-CM | POA: Insufficient documentation

## 2024-01-01 DIAGNOSIS — N184 Chronic kidney disease, stage 4 (severe): Secondary | ICD-10-CM | POA: Diagnosis not present

## 2024-01-01 DIAGNOSIS — D631 Anemia in chronic kidney disease: Secondary | ICD-10-CM | POA: Diagnosis not present

## 2024-01-01 DIAGNOSIS — G5622 Lesion of ulnar nerve, left upper limb: Secondary | ICD-10-CM | POA: Diagnosis not present

## 2024-01-01 DIAGNOSIS — Z800 Family history of malignant neoplasm of digestive organs: Secondary | ICD-10-CM | POA: Diagnosis not present

## 2024-01-01 DIAGNOSIS — Z803 Family history of malignant neoplasm of breast: Secondary | ICD-10-CM | POA: Insufficient documentation

## 2024-01-01 DIAGNOSIS — G5602 Carpal tunnel syndrome, left upper limb: Secondary | ICD-10-CM | POA: Diagnosis not present

## 2024-01-01 DIAGNOSIS — D508 Other iron deficiency anemias: Secondary | ICD-10-CM

## 2024-01-01 MED ORDER — IRON SUCROSE 20 MG/ML IV SOLN
200.0000 mg | INTRAVENOUS | Status: DC
  Administered 2024-01-01: 200 mg via INTRAVENOUS
  Filled 2024-01-01: qty 10

## 2024-01-01 MED FILL — Metolazone Tab 2.5 MG: ORAL | 90 days supply | Qty: 30 | Fill #0 | Status: AC

## 2024-01-01 NOTE — Patient Instructions (Signed)

## 2024-01-02 ENCOUNTER — Other Ambulatory Visit: Payer: Self-pay

## 2024-01-05 ENCOUNTER — Encounter: Admitting: Cardiology

## 2024-01-05 ENCOUNTER — Other Ambulatory Visit: Payer: Self-pay

## 2024-01-06 ENCOUNTER — Other Ambulatory Visit: Payer: Self-pay

## 2024-01-06 DIAGNOSIS — I482 Chronic atrial fibrillation, unspecified: Secondary | ICD-10-CM

## 2024-01-06 DIAGNOSIS — G5622 Lesion of ulnar nerve, left upper limb: Secondary | ICD-10-CM | POA: Diagnosis not present

## 2024-01-06 DIAGNOSIS — R296 Repeated falls: Secondary | ICD-10-CM

## 2024-01-06 DIAGNOSIS — R040 Epistaxis: Secondary | ICD-10-CM

## 2024-01-06 DIAGNOSIS — G5602 Carpal tunnel syndrome, left upper limb: Secondary | ICD-10-CM | POA: Diagnosis not present

## 2024-01-06 DIAGNOSIS — M25512 Pain in left shoulder: Secondary | ICD-10-CM | POA: Diagnosis not present

## 2024-01-06 DIAGNOSIS — D631 Anemia in chronic kidney disease: Secondary | ICD-10-CM

## 2024-01-06 DIAGNOSIS — S42202D Unspecified fracture of upper end of left humerus, subsequent encounter for fracture with routine healing: Secondary | ICD-10-CM | POA: Diagnosis not present

## 2024-01-07 ENCOUNTER — Other Ambulatory Visit: Payer: Self-pay

## 2024-01-07 ENCOUNTER — Encounter: Admitting: Physician Assistant

## 2024-01-07 DIAGNOSIS — L97822 Non-pressure chronic ulcer of other part of left lower leg with fat layer exposed: Secondary | ICD-10-CM | POA: Diagnosis not present

## 2024-01-07 DIAGNOSIS — I890 Lymphedema, not elsewhere classified: Secondary | ICD-10-CM | POA: Diagnosis not present

## 2024-01-07 DIAGNOSIS — I5042 Chronic combined systolic (congestive) and diastolic (congestive) heart failure: Secondary | ICD-10-CM | POA: Diagnosis not present

## 2024-01-07 DIAGNOSIS — E11622 Type 2 diabetes mellitus with other skin ulcer: Secondary | ICD-10-CM | POA: Diagnosis not present

## 2024-01-07 DIAGNOSIS — I480 Paroxysmal atrial fibrillation: Secondary | ICD-10-CM | POA: Diagnosis not present

## 2024-01-07 DIAGNOSIS — S61411A Laceration without foreign body of right hand, initial encounter: Secondary | ICD-10-CM | POA: Diagnosis not present

## 2024-01-07 DIAGNOSIS — Z7901 Long term (current) use of anticoagulants: Secondary | ICD-10-CM | POA: Diagnosis not present

## 2024-01-07 DIAGNOSIS — I87333 Chronic venous hypertension (idiopathic) with ulcer and inflammation of bilateral lower extremity: Secondary | ICD-10-CM | POA: Diagnosis not present

## 2024-01-07 DIAGNOSIS — I110 Hypertensive heart disease with heart failure: Secondary | ICD-10-CM | POA: Diagnosis not present

## 2024-01-07 DIAGNOSIS — L97812 Non-pressure chronic ulcer of other part of right lower leg with fat layer exposed: Secondary | ICD-10-CM | POA: Diagnosis not present

## 2024-01-08 ENCOUNTER — Encounter: Payer: Self-pay | Admitting: Sleep Medicine

## 2024-01-08 ENCOUNTER — Inpatient Hospital Stay

## 2024-01-08 ENCOUNTER — Ambulatory Visit: Admitting: Sleep Medicine

## 2024-01-08 ENCOUNTER — Other Ambulatory Visit: Payer: Self-pay

## 2024-01-08 VITALS — BP 120/68 | Ht 69.5 in | Wt 237.0 lb

## 2024-01-08 VITALS — BP 136/66 | HR 61 | Temp 98.6°F | Resp 18

## 2024-01-08 DIAGNOSIS — I10 Essential (primary) hypertension: Secondary | ICD-10-CM

## 2024-01-08 DIAGNOSIS — G4733 Obstructive sleep apnea (adult) (pediatric): Secondary | ICD-10-CM

## 2024-01-08 MED ORDER — SODIUM CHLORIDE 0.9% FLUSH
10.0000 mL | Freq: Once | INTRAVENOUS | Status: DC | PRN
  Filled 2024-01-08: qty 10

## 2024-01-08 MED ORDER — IRON SUCROSE 20 MG/ML IV SOLN
200.0000 mg | INTRAVENOUS | Status: DC
  Filled 2024-01-08: qty 10

## 2024-01-08 NOTE — Progress Notes (Signed)
 Unable to obtain IV access today. Patient will be rescheduled for another day.

## 2024-01-08 NOTE — Patient Instructions (Signed)
 Patient Instructions  Continue to use CPAP every night, minimum of 7-8 hours a night.  Change equipment every 30 days or as directed by DME.  Wash your tubing with warm soap and water weekly, hang to dry. Wash humidifier portion weekly. Use distilled water and change daily.  Be aware of reduced alertness and do not drive or operate heavy machinery if experiencing this or drowsiness.  Exercise encouraged, as tolerated. Encouraged proper weight management.  Important to get eight or more hours of sleep  Limiting the use of the computer and television before bedtime.  Decrease naps during the [place], so night time sleep will become enhanced.  Limit caffeine, and sleep deprivation.  HTN, stroke, uncontrolled diabetes and heart failure are potential risk factors.  Risk of untreated sleep apnea including cardiac arrhthymias, stroke, DM, pulm HTN.

## 2024-01-08 NOTE — Progress Notes (Signed)
 Name:[name]. MRN: [id number] DOB: [birth year]   CHIEF COMPLAINT:  CPAP F/U   HISTORY OF PRESENT ILLNESS: Mr. Chait is a 63 y.o. w/ a h/o OSA, CAD, HTN, hyperlipidemia, asthma and obesity who presents for BIPAP follow up visit. Reports using BIPAP therapy every night, which is confirmed by compliance data. He is currently using a full face mask, which is comfortable. Reports intermittent air leaks. Overall reports feeling significantly more refreshed upon awakening with CPAP therapy.    PAST MEDICAL HISTORY :   has a past medical history of Anal condyloma, Anemia, Anxiety, Aortic atherosclerosis, Aortic stenosis, Asthma, persistent controlled (02/25/2013), Attention or concentration deficit (04/26/2021), BPH with obstruction/lower urinary tract symptoms (09/25/2014), CAD (coronary artery disease) (2005), Cardiomegaly, Cellulitis and abscess of left leg (07/22/2020), Chronic atrial fibrillation, Chronic combined systolic and diastolic CHF (congestive heart failure), Chronic venous stasis dermatitis of both lower extremities, Complicated UTI (urinary tract infection) (06/24/2022), Cytomegaloviral disease (2017), Diverticulosis of colon (04/22/2013), Elevated RIGHT hemidiaphragm, Erectile dysfunction, ESRD (end stage renal disease) (HCC), Essential hypertension (12/17/2010), Gout, History of 2019 novel coronavirus disease (COVID-19) (06/30/2019), History of bilateral cataract extraction (10/2017), History of renal transplant (09/13/2010), bilateral cataract extraction (10/2017), Hyperlipidemia, Hypogonadism in male (09/25/2014), Ischemic cardiomyopathy, Left cephalic vein thrombosis (06/2019), Long term current use of anticoagulant, Long term current use of immunosuppressive drug, Major depressive disorder (10/04/2013), Mitral stenosis (11/07/2021), Murmur, Obesity hypoventilation syndrome (03/31/2012), OSA treated with BiPAP (09/29/2012), PAH (pulmonary artery hypertension),  Pancreatitis, S/P CABG x 4 (08/13/2002), S/P gastric bypass, Synovitis of finger (06/11/2019), Trigger finger, unspecified little finger (12/23/2018), Type 2 diabetes mellitus with hyperglycemia, with long-term current use of insulin  (09/09/2014), Ulcer, Wears glasses, and Wears hearing aid in both ears.  has a past surgical history that includes Kidney transplant (09/13/2010); Tympanic membrane repair (Left, 03/2010); DG ANGIO AV SHUNT*L*; Fasciotomy (03/03/2012); Coronary artery bypass graft (N/A, 08/13/2002); Colonoscopy with propofol  (N/A, 04/22/2013); Fasciotomy (Left, 08/17/2013); Roux-en-Y Gastric Bypass (N/A, 2015); Vasectomy; Incision and drainage abscess (Left, 10/15/2015); RIGHT HEART CATH (N/A, 11/15/2016); Cataract extraction w/PHACO (Right, 10/29/2017); Cataract extraction w/PHACO (Left, 11/18/2017); RIGHT HEART CATH (N/A, 05/03/2019); Coronary angioplasty with stent (N/A, 1996); Cardiac catheterization (N/A, 08/10/2002); Cystoscopy with insertion of urolift (N/A, 12/25/2021); RIGHT HEART CATH (N/A, 04/04/2022); Wart fulguration (N/A, 05/31/2022); Laser ablation condolamata (N/A, 01/08/2023); Rectal exam under anesthesia (N/A, 01/08/2023); Laser ablation condolamata (N/A, 11/20/2023); and Rectal exam under anesthesia (N/A, 11/20/2023). Prior to Admission medications   Medication Sig Start Date End Date Taking? Authorizing Provider  albuterol  (PROVENTIL ) (2.5 MG/3ML) 0.083% nebulizer solution Take 3 mLs (2.5 mg total) by nebulization every 6 (six) hours as needed for wheezing or shortness of breath. 11/18/22  Yes [name], MD  albuterol  (VENTOLIN  HFA) 108 (90 Base) MCG/ACT inhaler Inhale 2 puffs into the lungs every 6 (six) hours as needed for wheezing or shortness of breath. 11/18/22  Yes [name], MD  amoxicillin  (AMOXIL ) 500 MG capsule Take 4 capsules (2,000) 30-60 min prior to dental appointment. 12/12/23  Yes   apixaban  (ELIQUIS ) 5 MG TABS tablet Take 1 tablet (5 mg total) by  mouth 2 (two) times daily. 11/30/23 11/29/24 Yes Laurita Pillion, MD  buPROPion  (WELLBUTRIN  SR) 150 MG 12 hr tablet Take 1 tablet (150 mg total) by mouth 2 (two) times daily. 05/14/23 05/13/24 Yes [name], MD  colchicine  0.6 MG tablet Take 0.6 mg by mouth every other day.   Yes [provider]  Continuous Glucose Sensor (FREESTYLE LIBRE 3  PLUS SENSOR) MISC Use 1 each every 15 (fifteen) days 12/12/23  Yes   Cyanocobalamin  (B-12 PO) Take 1 tablet by mouth daily.   Yes [provider]  dapagliflozin  propanediol (FARXIGA ) 10 MG TABS tablet Take 1 tablet (10 mg total) by mouth daily before breakfast. 12/31/23  Yes [name], MD  diazepam  (VALIUM ) 5 MG tablet Take 1 tablet (5 mg total) by mouth every 8 (eight) hours as needed for muscle spasms (difficulty urinating). 11/20/23  Yes Sheldon Standing, MD  febuxostat  (ULORIC ) 40 MG tablet Take 3 tablets (120 mg total) by mouth daily. 07/30/23  Yes   fluticasone  (FLONASE ) 50 MCG/ACT nasal spray Place 2 sprays into both nostrils daily. Patient taking differently: Place 2 sprays into both nostrils daily as needed for allergies or rhinitis. 09/09/23  Yes [name], MD  gabapentin  (NEURONTIN ) 300 MG capsule Take 1 capsule (300 mg total) by mouth 2 (two) times daily. 05/16/23  Yes   hydrALAZINE  (APRESOLINE ) 25 MG tablet Take 25 mg by mouth. 12/12/23  Yes [provider]  HYDROcodone -acetaminophen  (NORCO) 10-325 MG tablet TAKE 1 TABLET BY MOUTH EVERY 4 HOURS FOR 3 DAYS AS NEEDED FOR PAIN   Yes [provider]  insulin  glargine-yfgn (SEMGLEE ) 100 UNIT/ML Pen Inject 20 Units into the skin daily. Patient taking differently: Inject 14 Units into the skin daily. 06/16/23  Yes   insulin  glargine-yfgn (SEMGLEE ) 100 UNIT/ML Pen Inject 20 Units into the skin daily. 12/12/23  Yes   insulin  glargine-yfgn (SEMGLEE ) 100 UNIT/ML Pen Inject 24 Units subcutaneously once daily. 12/15/23  Yes   Insulin  Pen Needle (TECHLITE PEN NEEDLES) 31G X 5 MM  MISC 4 (four) times daily. 08/21/22  Yes Brendia Calton Squires, PA-C  Insulin  Pen Needle (UNIFINE PENTIPS) 31G X 5 MM MISC Use 4 times daily as directed 05/28/21  Yes   lisinopril  (ZESTRIL ) 40 MG tablet Take 40 mg by mouth. 08/19/18  Yes [provider]  metolazone  (ZAROXOLYN ) 2.5 MG tablet Take 1 tablet (2.5 mg total) by mouth as directed by the heart failure clinic. 01/01/24  Yes [name], MD  montelukast  (SINGULAIR ) 10 MG tablet Take 1 tablet (10 mg total) by mouth at bedtime. 01/10/23 02/07/24 Yes [name], MD  mycophenolate  (MYFORTIC ) 180 MG EC tablet Take 2 tablets (360 mg total) by mouth every morning AND 1 tablet (180 mg total) every evening. 04/21/23  Yes   omeprazole  (PRILOSEC) 20 MG capsule Take 1 capsule (20 mg total) by mouth daily. 01/09/23 02/07/24 Yes [name], MD  oxyCODONE -acetaminophen  (PERCOCET) 10-325 MG tablet Take 1 tablet by mouth every 6 (six) hours as needed for up to 5 days. 12/02/23  Yes   predniSONE  (DELTASONE ) 5 MG tablet Take 1 tablet (5 mg total) by mouth daily. 03/12/23  Yes   rosuvastatin  (CRESTOR ) 10 MG tablet Take 1 tablet (10 mg total) by mouth daily. 10/30/23  Yes [name], MD  sodium chloride  (OCEAN) 0.65 % SOLN nasal spray Place 2 sprays into both nostrils every 8 (eight) hours for 10 days. Patient taking differently: Place 2 sprays into both nostrils as needed for congestion. 09/29/23 10/23/24 Yes Franchot Novel, MD  tacrolimus  (PROGRAF ) 1 MG capsule Take 1 mg by mouth. 12/19/23  Yes [provider]  tadalafil  (CIALIS ) 5 MG tablet Take 5 mg by mouth daily as needed for erectile dysfunction.   Yes [provider]  tamsulosin  (FLOMAX ) 0.4 MG CAPS capsule Take 1 capsule (0.4 mg total) by mouth daily.  01/16/23  Yes Vaillancourt, Samantha, PA-C  Testosterone  Enanthate (XYOSTED) 75 MG/0.5ML SOAJ Inject 0.5 mLs into the skin once a week. 12/22/23  Yes Vaillancourt, Samantha, PA-C  tirzepatide  (MOUNJARO )  7.5 MG/0.5ML Pen Inject 7.5 mg into the skin once a week. 12/12/23  Yes   torsemide  (DEMADEX ) 100 MG tablet Take 100 mg by mouth. 12/12/23  Yes [provider]  traMADol  (ULTRAM ) 50 MG tablet Take One tab PO Q6 hours PRN pain 06/26/23  Yes [provider]  Zinc  50 MG TABS Take 1 tablet (50 mg total) by mouth daily. 04/21/23  Yes [name], MD   Allergies  Allergen Reactions   Iodinated Contrast Media Other (See Comments)    Kidney transplant  Iodinated contrast media (substance)   Iodine Other (See Comments)    Kidney transplant   Nsaids Other (See Comments)    Kidney transplant  Kidney transplant    Non-steroidal anti-inflammatory agent (substance)   Other Other (See Comments)    Non-steroidal anti-inflammatory agent (substance)   Ibuprofen Other (See Comments)    Due to kidney transplant    FAMILY HISTORY:  family history includes Breast cancer in his maternal grandmother; Diabetes in his maternal grandmother; Heart disease in his father; Kidney disease in his father; Kidney failure in his father; Liver cancer in his paternal grandmother and paternal uncle; Valvular heart disease in his mother. SOCIAL HISTORY:  reports that he has never smoked. He has never used smokeless tobacco. He reports that he does not currently use alcohol. He reports that he does not use drugs.   Review of Systems:  Gen:  Denies  fever, sweats, chills weight loss  HEENT: Denies blurred vision, double vision, ear pain, eye pain, hearing loss, nose bleeds, sore throat Cardiac:  No dizziness, chest pain or heaviness, chest tightness,edema, No JVD Resp:   No cough, -sputum production, -shortness of breath,-wheezing, -hemoptysis,  Gi: Denies swallowing difficulty, stomach pain, nausea or vomiting, diarrhea, constipation, bowel incontinence Gu:  Denies bladder incontinence, burning urine Ext:   Denies Joint pain, stiffness or swelling Skin: Denies  skin rash, easy bruising or bleeding  or hives Endoc:  Denies polyuria, polydipsia , polyphagia or weight change Psych:   Denies depression, insomnia or hallucinations  Other:  All other systems negative  VITAL SIGNS: BP 120/68   Ht 5' 9.5 (1.765 m)   Wt 237 lb (107.5 kg)   BMI 34.50 kg/m    Physical Examination:   General Appearance: No distress  EYES PERRLA, EOM intact.   NECK Supple, No JVD Pulmonary: normal breath sounds, No wheezing.  CardiovascularNormal S1,S2.  No m/r/g.   Abdomen: Benign, Soft, non-tender. Skin:   warm, no rashes, no ecchymosis  Extremities: normal, no cyanosis, clubbing. Neuro:without focal findings,  speech normal  PSYCHIATRIC: Mood, affect within normal limits.   ASSESSMENT AND PLAN  OSA Patient is using and benefiting from PAP therapy. Counseled patient on proper mask fit. Will try patient on the Airfit X30i FFM. Discussed the consequences of untreated sleep apnea. Advised not to drive drowsy for safety of patient and others. Will follow up in 1 year.    HTN Stable, on current management. Following with PCP.    Patient  satisfied with Plan of action and management. All questions answered  I spent a total of 20 minutes reviewing chart data, face-to-face evaluation with the patient, counseling and coordination of care as detailed above.    Taquila Leys, M.D.  Sleep Medicine [facility]

## 2024-01-12 ENCOUNTER — Other Ambulatory Visit: Payer: Self-pay

## 2024-01-12 DIAGNOSIS — M25512 Pain in left shoulder: Secondary | ICD-10-CM | POA: Diagnosis not present

## 2024-01-12 DIAGNOSIS — M25612 Stiffness of left shoulder, not elsewhere classified: Secondary | ICD-10-CM | POA: Diagnosis not present

## 2024-01-13 ENCOUNTER — Ambulatory Visit: Admitting: Physician Assistant

## 2024-01-13 ENCOUNTER — Other Ambulatory Visit: Payer: Self-pay

## 2024-01-13 ENCOUNTER — Encounter: Admitting: Physician Assistant

## 2024-01-13 DIAGNOSIS — I890 Lymphedema, not elsewhere classified: Secondary | ICD-10-CM | POA: Diagnosis not present

## 2024-01-13 DIAGNOSIS — I87333 Chronic venous hypertension (idiopathic) with ulcer and inflammation of bilateral lower extremity: Secondary | ICD-10-CM | POA: Diagnosis not present

## 2024-01-13 DIAGNOSIS — E11622 Type 2 diabetes mellitus with other skin ulcer: Secondary | ICD-10-CM | POA: Diagnosis not present

## 2024-01-13 DIAGNOSIS — E1142 Type 2 diabetes mellitus with diabetic polyneuropathy: Secondary | ICD-10-CM | POA: Diagnosis not present

## 2024-01-13 DIAGNOSIS — S42202D Unspecified fracture of upper end of left humerus, subsequent encounter for fracture with routine healing: Secondary | ICD-10-CM | POA: Diagnosis not present

## 2024-01-13 DIAGNOSIS — I110 Hypertensive heart disease with heart failure: Secondary | ICD-10-CM | POA: Diagnosis not present

## 2024-01-13 DIAGNOSIS — L97822 Non-pressure chronic ulcer of other part of left lower leg with fat layer exposed: Secondary | ICD-10-CM | POA: Diagnosis not present

## 2024-01-13 DIAGNOSIS — Z7901 Long term (current) use of anticoagulants: Secondary | ICD-10-CM | POA: Diagnosis not present

## 2024-01-13 DIAGNOSIS — S61411A Laceration without foreign body of right hand, initial encounter: Secondary | ICD-10-CM | POA: Diagnosis not present

## 2024-01-13 DIAGNOSIS — I5042 Chronic combined systolic (congestive) and diastolic (congestive) heart failure: Secondary | ICD-10-CM | POA: Diagnosis not present

## 2024-01-13 DIAGNOSIS — G5622 Lesion of ulnar nerve, left upper limb: Secondary | ICD-10-CM | POA: Diagnosis not present

## 2024-01-13 DIAGNOSIS — G5602 Carpal tunnel syndrome, left upper limb: Secondary | ICD-10-CM | POA: Diagnosis not present

## 2024-01-13 DIAGNOSIS — L97812 Non-pressure chronic ulcer of other part of right lower leg with fat layer exposed: Secondary | ICD-10-CM | POA: Diagnosis not present

## 2024-01-13 DIAGNOSIS — M720 Palmar fascial fibromatosis [Dupuytren]: Secondary | ICD-10-CM | POA: Diagnosis not present

## 2024-01-13 DIAGNOSIS — I480 Paroxysmal atrial fibrillation: Secondary | ICD-10-CM | POA: Diagnosis not present

## 2024-01-14 ENCOUNTER — Other Ambulatory Visit: Payer: Self-pay | Admitting: Family Medicine

## 2024-01-14 ENCOUNTER — Other Ambulatory Visit: Payer: Self-pay

## 2024-01-14 ENCOUNTER — Encounter: Payer: Self-pay | Admitting: Oncology

## 2024-01-14 ENCOUNTER — Other Ambulatory Visit: Payer: Self-pay | Admitting: Cardiology

## 2024-01-14 MED ORDER — TRAMADOL HCL 50 MG PO TABS
50.0000 mg | ORAL_TABLET | Freq: Four times a day (QID) | ORAL | 0 refills | Status: DC | PRN
  Filled 2024-01-14: qty 40, 10d supply, fill #0

## 2024-01-14 MED FILL — Torsemide Tab 100 MG: ORAL | 45 days supply | Qty: 90 | Fill #0 | Status: AC

## 2024-01-14 MED FILL — Tadalafil Tab 20 MG: ORAL | 30 days supply | Qty: 10 | Fill #0 | Status: AC

## 2024-01-14 NOTE — Telephone Encounter (Signed)
 This is on pt med list but no refill date. Please advise to refill.

## 2024-01-15 ENCOUNTER — Inpatient Hospital Stay

## 2024-01-15 ENCOUNTER — Other Ambulatory Visit: Payer: Self-pay

## 2024-01-15 VITALS — BP 125/72 | HR 52 | Temp 99.0°F | Resp 16

## 2024-01-15 DIAGNOSIS — D508 Other iron deficiency anemias: Secondary | ICD-10-CM

## 2024-01-15 DIAGNOSIS — Z803 Family history of malignant neoplasm of breast: Secondary | ICD-10-CM | POA: Diagnosis not present

## 2024-01-15 DIAGNOSIS — N184 Chronic kidney disease, stage 4 (severe): Secondary | ICD-10-CM | POA: Diagnosis not present

## 2024-01-15 MED ORDER — IRON SUCROSE 20 MG/ML IV SOLN
200.0000 mg | Freq: Once | INTRAVENOUS | Status: AC
  Administered 2024-01-15: 200 mg via INTRAVENOUS
  Filled 2024-01-15: qty 10

## 2024-01-15 NOTE — Patient Instructions (Signed)

## 2024-01-16 ENCOUNTER — Other Ambulatory Visit: Payer: Self-pay

## 2024-01-16 NOTE — Telephone Encounter (Signed)
 Please check with patient if he is  still taking this or not. I believe it was only a temporary course.

## 2024-01-19 ENCOUNTER — Other Ambulatory Visit: Payer: Self-pay

## 2024-01-19 DIAGNOSIS — M25612 Stiffness of left shoulder, not elsewhere classified: Secondary | ICD-10-CM | POA: Diagnosis not present

## 2024-01-19 DIAGNOSIS — M542 Cervicalgia: Secondary | ICD-10-CM | POA: Diagnosis not present

## 2024-01-19 DIAGNOSIS — M25512 Pain in left shoulder: Secondary | ICD-10-CM | POA: Diagnosis not present

## 2024-01-20 ENCOUNTER — Other Ambulatory Visit: Payer: Self-pay

## 2024-01-20 ENCOUNTER — Encounter: Admitting: Physician Assistant

## 2024-01-20 DIAGNOSIS — L97812 Non-pressure chronic ulcer of other part of right lower leg with fat layer exposed: Secondary | ICD-10-CM | POA: Diagnosis not present

## 2024-01-20 DIAGNOSIS — I5042 Chronic combined systolic (congestive) and diastolic (congestive) heart failure: Secondary | ICD-10-CM | POA: Diagnosis not present

## 2024-01-20 DIAGNOSIS — I10 Essential (primary) hypertension: Secondary | ICD-10-CM | POA: Diagnosis not present

## 2024-01-20 DIAGNOSIS — I890 Lymphedema, not elsewhere classified: Secondary | ICD-10-CM | POA: Diagnosis not present

## 2024-01-20 DIAGNOSIS — Z7901 Long term (current) use of anticoagulants: Secondary | ICD-10-CM | POA: Diagnosis not present

## 2024-01-20 DIAGNOSIS — I110 Hypertensive heart disease with heart failure: Secondary | ICD-10-CM | POA: Diagnosis not present

## 2024-01-20 DIAGNOSIS — L97822 Non-pressure chronic ulcer of other part of left lower leg with fat layer exposed: Secondary | ICD-10-CM | POA: Diagnosis not present

## 2024-01-20 DIAGNOSIS — I87333 Chronic venous hypertension (idiopathic) with ulcer and inflammation of bilateral lower extremity: Secondary | ICD-10-CM | POA: Diagnosis not present

## 2024-01-20 DIAGNOSIS — S81809A Unspecified open wound, unspecified lower leg, initial encounter: Secondary | ICD-10-CM | POA: Diagnosis not present

## 2024-01-20 DIAGNOSIS — I480 Paroxysmal atrial fibrillation: Secondary | ICD-10-CM | POA: Diagnosis not present

## 2024-01-20 DIAGNOSIS — S81812A Laceration without foreign body, left lower leg, initial encounter: Secondary | ICD-10-CM | POA: Diagnosis not present

## 2024-01-20 DIAGNOSIS — S61411A Laceration without foreign body of right hand, initial encounter: Secondary | ICD-10-CM | POA: Diagnosis not present

## 2024-01-20 DIAGNOSIS — E11622 Type 2 diabetes mellitus with other skin ulcer: Secondary | ICD-10-CM | POA: Diagnosis not present

## 2024-01-21 ENCOUNTER — Encounter: Admitting: Family Medicine

## 2024-01-22 DIAGNOSIS — M542 Cervicalgia: Secondary | ICD-10-CM | POA: Diagnosis not present

## 2024-01-22 DIAGNOSIS — M25612 Stiffness of left shoulder, not elsewhere classified: Secondary | ICD-10-CM | POA: Diagnosis not present

## 2024-01-22 DIAGNOSIS — M25512 Pain in left shoulder: Secondary | ICD-10-CM | POA: Diagnosis not present

## 2024-01-22 DIAGNOSIS — G5132 Clonic hemifacial spasm, left: Secondary | ICD-10-CM | POA: Diagnosis not present

## 2024-01-23 ENCOUNTER — Other Ambulatory Visit: Payer: Self-pay

## 2024-01-23 MED ORDER — COLCHICINE 0.6 MG PO TABS
0.6000 mg | ORAL_TABLET | Freq: Every day | ORAL | 1 refills | Status: DC
  Filled 2024-01-23: qty 90, 90d supply, fill #0

## 2024-01-26 ENCOUNTER — Other Ambulatory Visit: Payer: Self-pay

## 2024-01-26 ENCOUNTER — Emergency Department

## 2024-01-26 ENCOUNTER — Encounter: Payer: Self-pay | Admitting: Oncology

## 2024-01-26 ENCOUNTER — Emergency Department: Admission: EM | Admit: 2024-01-26 | Discharge: 2024-01-26 | Disposition: A | Discharge status: Home / Self Care

## 2024-01-26 DIAGNOSIS — I482 Chronic atrial fibrillation, unspecified: Secondary | ICD-10-CM | POA: Diagnosis not present

## 2024-01-26 DIAGNOSIS — E119 Type 2 diabetes mellitus without complications: Secondary | ICD-10-CM | POA: Diagnosis not present

## 2024-01-26 DIAGNOSIS — I2510 Atherosclerotic heart disease of native coronary artery without angina pectoris: Secondary | ICD-10-CM | POA: Insufficient documentation

## 2024-01-26 DIAGNOSIS — I509 Heart failure, unspecified: Secondary | ICD-10-CM | POA: Insufficient documentation

## 2024-01-26 DIAGNOSIS — Z940 Kidney transplant status: Secondary | ICD-10-CM | POA: Insufficient documentation

## 2024-01-26 DIAGNOSIS — Z7901 Long term (current) use of anticoagulants: Secondary | ICD-10-CM | POA: Insufficient documentation

## 2024-01-26 DIAGNOSIS — R0989 Other specified symptoms and signs involving the circulatory and respiratory systems: Secondary | ICD-10-CM | POA: Diagnosis not present

## 2024-01-26 DIAGNOSIS — Z951 Presence of aortocoronary bypass graft: Secondary | ICD-10-CM | POA: Diagnosis not present

## 2024-01-26 DIAGNOSIS — D72819 Decreased white blood cell count, unspecified: Secondary | ICD-10-CM | POA: Diagnosis not present

## 2024-01-26 DIAGNOSIS — R079 Chest pain, unspecified: Secondary | ICD-10-CM | POA: Diagnosis not present

## 2024-01-26 DIAGNOSIS — I517 Cardiomegaly: Secondary | ICD-10-CM | POA: Diagnosis not present

## 2024-01-26 DIAGNOSIS — R0789 Other chest pain: Secondary | ICD-10-CM | POA: Diagnosis not present

## 2024-01-26 LAB — CBC WITH DIFFERENTIAL/PLATELET
Abs Immature Granulocytes: 0 K/uL (ref 0.00–0.07)
Basophils Absolute: 0 K/uL (ref 0.0–0.1)
Basophils Relative: 1 %
Eosinophils Absolute: 0 K/uL (ref 0.0–0.5)
Eosinophils Relative: 1 %
HCT: 34.3 % — ABNORMAL LOW (ref 39.0–52.0)
Hemoglobin: 10.6 g/dL — ABNORMAL LOW (ref 13.0–17.0)
Immature Granulocytes: 0 %
Lymphocytes Relative: 32 %
Lymphs Abs: 1.2 K/uL (ref 0.7–4.0)
MCH: 29.1 pg (ref 26.0–34.0)
MCHC: 30.9 g/dL (ref 30.0–36.0)
MCV: 94.2 fL (ref 80.0–100.0)
Monocytes Absolute: 0.3 K/uL (ref 0.1–1.0)
Monocytes Relative: 8 %
Neutro Abs: 2.1 K/uL (ref 1.7–7.7)
Neutrophils Relative %: 58 %
Platelets: 88 K/uL — ABNORMAL LOW (ref 150–400)
RBC: 3.64 MIL/uL — ABNORMAL LOW (ref 4.22–5.81)
RDW: 18 % — ABNORMAL HIGH (ref 11.5–15.5)
WBC: 3.6 K/uL — ABNORMAL LOW (ref 4.0–10.5)
nRBC: 0 % (ref 0.0–0.2)

## 2024-01-26 LAB — COMPREHENSIVE METABOLIC PANEL WITH GFR
ALT: 15 U/L (ref 0–44)
AST: 25 U/L (ref 15–41)
Albumin: 3.6 g/dL (ref 3.5–5.0)
Alkaline Phosphatase: 177 U/L — ABNORMAL HIGH (ref 38–126)
Anion gap: 11 (ref 5–15)
BUN: 51 mg/dL — ABNORMAL HIGH (ref 8–23)
CO2: 27 mmol/L (ref 22–32)
Calcium: 8.7 mg/dL — ABNORMAL LOW (ref 8.9–10.3)
Chloride: 102 mmol/L (ref 98–111)
Creatinine, Ser: 2.2 mg/dL — ABNORMAL HIGH (ref 0.61–1.24)
GFR, Estimated: 33 mL/min — ABNORMAL LOW (ref >60)
Glucose, Bld: 90 mg/dL (ref 70–99)
Potassium: 3.6 mmol/L (ref 3.5–5.1)
Sodium: 140 mmol/L (ref 135–145)
Total Bilirubin: 1.3 mg/dL — ABNORMAL HIGH (ref 0.0–1.2)
Total Protein: 7.7 g/dL (ref 6.5–8.1)

## 2024-01-26 LAB — BRAIN NATRIURETIC PEPTIDE: B Natriuretic Peptide: 544.9 pg/mL — ABNORMAL HIGH (ref 0.0–100.0)

## 2024-01-26 LAB — PROTIME-INR
INR: 1.8 — ABNORMAL HIGH (ref 0.8–1.2)
Prothrombin Time: 21.8 s — ABNORMAL HIGH (ref 11.4–15.2)

## 2024-01-26 LAB — LIPASE, BLOOD: Lipase: 32 U/L (ref 11–51)

## 2024-01-26 LAB — TROPONIN I (HIGH SENSITIVITY)
Troponin I (High Sensitivity): 35 ng/L — ABNORMAL HIGH (ref <18)
Troponin I (High Sensitivity): 32 ng/L — ABNORMAL HIGH (ref <18)

## 2024-01-26 MED ORDER — LIDOCAINE 5 % EX PTCH
1.0000 | MEDICATED_PATCH | Freq: Once | CUTANEOUS | Status: DC
  Administered 2024-01-26: 1 via TRANSDERMAL
  Filled 2024-01-26: qty 1

## 2024-01-26 MED ORDER — OMEPRAZOLE 20 MG PO CPDR
20.0000 mg | DELAYED_RELEASE_CAPSULE | Freq: Every day | ORAL | 0 refills | Status: DC
  Filled 2024-01-26: qty 14, 14d supply, fill #0

## 2024-01-26 MED ORDER — FENTANYL CITRATE (PF) 50 MCG/ML IJ SOSY
50.0000 ug | PREFILLED_SYRINGE | Freq: Once | INTRAMUSCULAR | Status: AC
  Administered 2024-01-26: 50 ug via INTRAVENOUS
  Filled 2024-01-26: qty 1

## 2024-01-26 MED ORDER — PANTOPRAZOLE SODIUM 40 MG IV SOLR
40.0000 mg | Freq: Once | INTRAVENOUS | Status: AC
  Administered 2024-01-26: 40 mg via INTRAVENOUS
  Filled 2024-01-26: qty 10

## 2024-01-26 MED ORDER — LIDOCAINE VISCOUS HCL 2 % MT SOLN
15.0000 mL | Freq: Once | OROMUCOSAL | Status: AC
  Administered 2024-01-26: 15 mL via ORAL
  Filled 2024-01-26: qty 15

## 2024-01-26 MED ORDER — ALUMINUM-MAGNESIUM-SIMETHICONE 200-200-20 MG/5ML PO SUSP
30.0000 mL | Freq: Three times a day (TID) | ORAL | 0 refills | Status: DC
  Filled 2024-01-26: qty 710, 6d supply, fill #0

## 2024-01-26 MED ORDER — ALUM & MAG HYDROXIDE-SIMETH 200-200-20 MG/5ML PO SUSP
30.0000 mL | Freq: Once | ORAL | Status: AC
  Administered 2024-01-26: 30 mL via ORAL
  Filled 2024-01-26: qty 30

## 2024-01-26 MED ORDER — ACETAMINOPHEN 500 MG PO TABS
1000.0000 mg | ORAL_TABLET | Freq: Once | ORAL | Status: AC
  Administered 2024-01-26: 1000 mg via ORAL
  Filled 2024-01-26: qty 2

## 2024-01-26 NOTE — ED Notes (Signed)
 RN at bedside to assess pt. SPO2 reading 87% while pt sleeping. Pt reports he has sleep apnea and wears CPAP at night. Pt placed on 2L while pt is resting. SPO2 98% at this time.

## 2024-01-26 NOTE — ED Provider Notes (Signed)
 [facility] Provider Note    Event Date/Time   First MD Initiated Contact with Patient 01/26/24 1011     (approximate)   History   Chest Pain  Pt presents to the ED via POV from home with left sided chest pain that radiates down his left arm that started last night. Pt reports a hx of afib. A&Ox4   HPI Dearius Isaak Delmundo. is a 63 y.o. male multiple medical comorbidities including chronic A-fib on Eliquis , heart failure, prior gastric bypass, ischemic cardiomyopathy, T2DM, CAD status post CABG (2005), renal transplant on immunosuppression presents for evaluation of chest pain - Started the left back/shoulder last night no patient says has radiated down into his chest and left arm today.  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  Denies any new focal weakness or numbness. -Does take his Eliquis  as prescribed -Pain is somewhat positional with ranging of his left shoulder.  No preceding trauma. -No cough, fever.  Does feel some shortness of breath.     Physical Exam   Triage Vital Signs: ED Triage Vitals  Encounter Vitals Group     BP 01/26/24 1004 (!) 149/76     Girls Systolic BP Percentile --      Girls Diastolic BP Percentile --      Boys Systolic BP Percentile --      Boys Diastolic BP Percentile --      Pulse Rate 01/26/24 1004 (!) 48     Resp 01/26/24 1004 18     Temp 01/26/24 1004 97.6 F (36.4 C)     Temp Source 01/26/24 1004 Oral     SpO2 01/26/24 1004 96 %     Weight 01/26/24 1001 235 lb 14.3 oz (107 kg)     Height 01/26/24 1001 5' 10 (1.778 m)     Head Circumference --      Peak Flow --      Pain Score 01/26/24 1001 10     Pain Loc --      Pain Education --      Exclude from Growth Chart --     Most recent vital signs: Vitals:   01/26/24 1400 01/26/24 1430  BP: 139/71 (!) 142/59  Pulse: 62 (!) 59  Resp: (!) 21 19  Temp:    SpO2: 97% 97%     General: Awake, appears uncomfortable CV:  Good  peripheral perfusion. RRR, RP 2+ and equal in bilateral upper Chest:  Some pain with anterior lateral chest wall with ranging of left shoulder and tenderness to palpation in this area Resp:  Normal effort. CTAB Abd:  No distention. Nontender to deep palpation throughout.  No pulsatile masses.     ED Results / Procedures / Treatments   Labs (all labs ordered are listed, but only abnormal results are displayed) Labs Reviewed  CBC WITH DIFFERENTIAL/PLATELET - Abnormal; Notable for the following components:      Result Value   WBC 3.6 (*)    RBC 3.64 (*)    Hemoglobin 10.6 (*)    HCT 34.3 (*)    RDW 18.0 (*)    Platelets 88 (*)    All other components within normal limits  COMPREHENSIVE METABOLIC PANEL WITH GFR - Abnormal; Notable for the following components:   BUN 51 (*)    Creatinine, Ser 2.20 (*)    Calcium  8.7 (*)    Alkaline Phosphatase 177 (*)    Total Bilirubin 1.3 (*)  GFR, Estimated 33 (*)    All other components within normal limits  PROTIME-INR - Abnormal; Notable for the following components:   Prothrombin Time 21.8 (*)    INR 1.8 (*)    All other components within normal limits  BRAIN NATRIURETIC PEPTIDE - Abnormal; Notable for the following components:   B Natriuretic Peptide 544.9 (*)    All other components within normal limits  TROPONIN I (HIGH SENSITIVITY) - Abnormal; Notable for the following components:   Troponin I (High Sensitivity) 35 (*)    All other components within normal limits  TROPONIN I (HIGH SENSITIVITY) - Abnormal; Notable for the following components:   Troponin I (High Sensitivity) 32 (*)    All other components within normal limits  LIPASE, BLOOD     EKG  Ecg = A-fib, frequent PVCs, rate 61, no gross ST elevation, some depressions and T wave inversions noted in leads V2 and V3.  Normal axis.  Normal intervals.  Borderline prolonged QTc at 499.  Interventricular conduction delay present.  Repeat EKG overall stable, T wave versions in  lead V2 decongest evident   RADIOLOGY Radiology interpreted by myself and radiology report reviewed.  No acute pathology identified.    PROCEDURES:  Critical Care performed: No  Procedures   MEDICATIONS ORDERED IN ED: Medications  lidocaine  (LIDODERM ) 5 % 1 patch (1 patch Transdermal Patch Applied 01/26/24 1259)  fentaNYL  (SUBLIMAZE ) injection 50 mcg (50 mcg Intravenous Given 01/26/24 1109)  acetaminophen  (TYLENOL ) tablet 1,000 mg (1,000 mg Oral Given 01/26/24 1247)  alum & mag hydroxide-simeth (MAALOX/MYLANTA) 200-200-20 MG/5ML suspension 30 mL (30 mLs Oral Given 01/26/24 1249)    And  lidocaine  (XYLOCAINE ) 2 % viscous mouth solution 15 mL (15 mLs Oral Given 01/26/24 1249)  pantoprazole  (PROTONIX ) injection 40 mg (40 mg Intravenous Given 01/26/24 1256)     IMPRESSION / MDM / ASSESSMENT AND PLAN / ED COURSE  I reviewed the triage vital signs and the nursing notes.                              DDX/MDM/AP: Differential diagnosis includes, but is not limited to, ACS, pneumothorax, doubt PE and is already anticoagulated patient, consider MSK strain, pneumonia, pleurisy, pericarditis.  Plan: - Labs - Chest x-ray - EKG - Pain control - N.p.o. - Reassess  Patient's presentation is most consistent with acute presentation with potential threat to life or bodily function.  The patient is on the cardiac monitor to evaluate for evidence of arrhythmia and/or significant heart rate changes.  ED course below.  Serial troponins stable and within baseline range.  EKG with no obvious ischemic findings.  Patient felt much better after GI cocktail in particular.  I did offer admission given his notable comorbidities for expedited stress testing though he declined and prefers to follow-up closely with his outpatient cardiologist with whom he is already closely established.  Rx omeprazole , Maalox.  After close PMD follow-up in addition.  ED return precautions in place.  Patient agrees with  plan.  Clinical Course as of 01/26/24 1509  Mon Jan 26, 2024  1146 Cbc w/ stable leukopenia, stable anemia  P with very mild bump in creatinine though overall near baseline range [MM]  1148 Troponin mildly elevated at 35 though downtrending from prior [MM]  1233 Patient with recurrent chest pain, trialing GI cocktail, Tylenol , Lidoderm  patch  Repeat troponin collected  Will escalate pain regimen as needed [MM]  1326 Rpt  trop stable [MM]  1423 Patient reevaluated, notes his chest discomfort entirely resolved after GI cocktail, does clarify that it was primarily substernal as opposed to left-sided  Given his age and comorbidities, chest pain is moderate risk despite reassuring cardiac workup today.  Did offer admission for expedited stress testing though patient prefers discharge home and close follow-up with his cardiologist I believe is reasonable.  Patient does have decision-making capacity on my evaluation and does understand risk of undiagnosed pathology leading to clinical decompensation including death.  Strict ED return precautions in place in the interim.  Patient agrees with plan. [MM]    Clinical Course User Index [MM] Clarine Ozell LABOR, MD     FINAL CLINICAL IMPRESSION(S) / ED DIAGNOSES   Final diagnoses:  Chest pain, unspecified type     Rx / DC Orders   ED Discharge Orders          Ordered    omeprazole  (PRILOSEC) 20 MG capsule  Daily        01/26/24 1456    aluminum-magnesium  hydroxide-simethicone  (MAALOX) 200-200-20 MG/5ML SUSP  3 times daily before meals & bedtime        01/26/24 1456             Note:  This document was prepared using Dragon voice recognition software and may include unintentional dictation errors.   Clarine Ozell LABOR, MD 01/26/24 [phone]

## 2024-01-26 NOTE — ED Triage Notes (Signed)
 Pt presents to the ED via POV from home with left sided chest pain that radiates down his left arm that started last night. Pt reports a hx of afib. A&Ox4

## 2024-01-26 NOTE — ED Notes (Signed)
 Attempts by multiple RN's to start a line x6. All attempts unsuccessful. Mian MD notified and MD states that they will attempt US  IV.

## 2024-01-26 NOTE — Discharge Instructions (Addendum)
 Your evaluation in the emergency department was overall reassuring.  I am unsure as to the exact cause of your chest pain today.  Your heart testing was reassuring, though this does not entirely rule out an underlying heart problem.  Your symptoms did seem to get better with antacid medications, and I have prescribed you 2 new antacid medications to use over the next couple weeks.  Please do follow-up closely with your cardiologist and primary care doctor for reevaluation, and return to the emergency department with any recurrent, new, or worsening symptoms.

## 2024-01-26 NOTE — ED Notes (Signed)
 MD Mian at bedside to US  IV access.

## 2024-01-27 ENCOUNTER — Other Ambulatory Visit: Payer: Self-pay

## 2024-01-27 ENCOUNTER — Encounter: Attending: Physician Assistant | Admitting: Physician Assistant

## 2024-01-27 DIAGNOSIS — I5042 Chronic combined systolic (congestive) and diastolic (congestive) heart failure: Secondary | ICD-10-CM | POA: Diagnosis not present

## 2024-01-27 DIAGNOSIS — L97812 Non-pressure chronic ulcer of other part of right lower leg with fat layer exposed: Secondary | ICD-10-CM | POA: Diagnosis not present

## 2024-01-27 DIAGNOSIS — I890 Lymphedema, not elsewhere classified: Secondary | ICD-10-CM | POA: Diagnosis not present

## 2024-01-27 DIAGNOSIS — E11622 Type 2 diabetes mellitus with other skin ulcer: Secondary | ICD-10-CM | POA: Diagnosis not present

## 2024-01-27 DIAGNOSIS — Z79899 Other long term (current) drug therapy: Secondary | ICD-10-CM | POA: Diagnosis not present

## 2024-01-27 DIAGNOSIS — I480 Paroxysmal atrial fibrillation: Secondary | ICD-10-CM | POA: Diagnosis not present

## 2024-01-27 DIAGNOSIS — M25512 Pain in left shoulder: Secondary | ICD-10-CM | POA: Diagnosis not present

## 2024-01-27 DIAGNOSIS — I87333 Chronic venous hypertension (idiopathic) with ulcer and inflammation of bilateral lower extremity: Secondary | ICD-10-CM | POA: Diagnosis not present

## 2024-01-27 DIAGNOSIS — Z940 Kidney transplant status: Secondary | ICD-10-CM | POA: Diagnosis not present

## 2024-01-27 DIAGNOSIS — I110 Hypertensive heart disease with heart failure: Secondary | ICD-10-CM | POA: Diagnosis not present

## 2024-01-27 DIAGNOSIS — M542 Cervicalgia: Secondary | ICD-10-CM | POA: Diagnosis not present

## 2024-01-27 DIAGNOSIS — I872 Venous insufficiency (chronic) (peripheral): Secondary | ICD-10-CM | POA: Diagnosis not present

## 2024-01-27 DIAGNOSIS — M25612 Stiffness of left shoulder, not elsewhere classified: Secondary | ICD-10-CM | POA: Diagnosis not present

## 2024-01-27 DIAGNOSIS — Z7901 Long term (current) use of anticoagulants: Secondary | ICD-10-CM | POA: Diagnosis not present

## 2024-01-27 MED ORDER — TRIAMCINOLONE ACETONIDE 0.025 % EX CREA
1.0000 | TOPICAL_CREAM | Freq: Every day | CUTANEOUS | 5 refills | Status: DC
  Filled 2024-01-27: qty 454, 90d supply, fill #0
  Filled 2024-03-13 – 2024-03-23 (×2): qty 454, 90d supply, fill #1
  Filled 2024-03-23: qty 454, 30d supply, fill #1

## 2024-01-28 ENCOUNTER — Encounter (HOSPITAL_COMMUNITY): Payer: Self-pay

## 2024-01-28 ENCOUNTER — Other Ambulatory Visit: Payer: Self-pay

## 2024-01-28 ENCOUNTER — Ambulatory Visit (HOSPITAL_COMMUNITY)
Admit: 2024-01-28 | Discharge: 2024-01-28 | Disposition: A | Discharge status: Home / Self Care | Source: Ambulatory Visit | Attending: Cardiology | Admitting: Cardiology

## 2024-01-28 VITALS — HR 56 | Ht 70.0 in | Wt 242.4 lb

## 2024-01-28 DIAGNOSIS — Z7984 Long term (current) use of oral hypoglycemic drugs: Secondary | ICD-10-CM | POA: Insufficient documentation

## 2024-01-28 DIAGNOSIS — Z79624 Long term (current) use of inhibitors of nucleotide synthesis: Secondary | ICD-10-CM | POA: Insufficient documentation

## 2024-01-28 DIAGNOSIS — Z7901 Long term (current) use of anticoagulants: Secondary | ICD-10-CM | POA: Diagnosis not present

## 2024-01-28 DIAGNOSIS — I2510 Atherosclerotic heart disease of native coronary artery without angina pectoris: Secondary | ICD-10-CM | POA: Diagnosis not present

## 2024-01-28 DIAGNOSIS — I132 Hypertensive heart and chronic kidney disease with heart failure and with stage 5 chronic kidney disease, or end stage renal disease: Secondary | ICD-10-CM | POA: Insufficient documentation

## 2024-01-28 DIAGNOSIS — Z951 Presence of aortocoronary bypass graft: Secondary | ICD-10-CM | POA: Insufficient documentation

## 2024-01-28 DIAGNOSIS — I482 Chronic atrial fibrillation, unspecified: Secondary | ICD-10-CM | POA: Diagnosis not present

## 2024-01-28 DIAGNOSIS — I255 Ischemic cardiomyopathy: Secondary | ICD-10-CM | POA: Insufficient documentation

## 2024-01-28 DIAGNOSIS — I272 Pulmonary hypertension, unspecified: Secondary | ICD-10-CM | POA: Diagnosis not present

## 2024-01-28 DIAGNOSIS — G4733 Obstructive sleep apnea (adult) (pediatric): Secondary | ICD-10-CM | POA: Insufficient documentation

## 2024-01-28 DIAGNOSIS — Z7985 Long-term (current) use of injectable non-insulin antidiabetic drugs: Secondary | ICD-10-CM | POA: Diagnosis not present

## 2024-01-28 DIAGNOSIS — I5022 Chronic systolic (congestive) heart failure: Secondary | ICD-10-CM | POA: Insufficient documentation

## 2024-01-28 DIAGNOSIS — Z79899 Other long term (current) drug therapy: Secondary | ICD-10-CM | POA: Insufficient documentation

## 2024-01-28 DIAGNOSIS — I5032 Chronic diastolic (congestive) heart failure: Secondary | ICD-10-CM | POA: Diagnosis not present

## 2024-01-28 DIAGNOSIS — N186 End stage renal disease: Secondary | ICD-10-CM | POA: Diagnosis not present

## 2024-01-28 DIAGNOSIS — Z940 Kidney transplant status: Secondary | ICD-10-CM | POA: Diagnosis not present

## 2024-01-28 DIAGNOSIS — E1122 Type 2 diabetes mellitus with diabetic chronic kidney disease: Secondary | ICD-10-CM | POA: Diagnosis not present

## 2024-01-28 DIAGNOSIS — Z794 Long term (current) use of insulin: Secondary | ICD-10-CM | POA: Diagnosis not present

## 2024-01-28 DIAGNOSIS — E8770 Fluid overload, unspecified: Secondary | ICD-10-CM | POA: Insufficient documentation

## 2024-01-28 DIAGNOSIS — I252 Old myocardial infarction: Secondary | ICD-10-CM | POA: Diagnosis not present

## 2024-01-28 DIAGNOSIS — D631 Anemia in chronic kidney disease: Secondary | ICD-10-CM | POA: Diagnosis not present

## 2024-01-28 MED ORDER — HYDRALAZINE HCL 25 MG PO TABS
25.0000 mg | ORAL_TABLET | Freq: Three times a day (TID) | ORAL | 5 refills | Status: DC
  Filled 2024-01-28: qty 90, 30d supply, fill #0

## 2024-01-28 NOTE — Progress Notes (Addendum)
 [facility] Note   Date:  01/28/2024   ID:  [name]., DOB 03/17/1961, MRN [id number]   Provider location: [address], [place] Type of Visit:  Established patient  PCP:  [name], MD  Cardiologist:  Dr. Darron Nephrology: Dr. Joshua  HF Cardiologist: Dr. Rolan  Reason for visit: f/u for HFmrEF   HPI: [name]. is a 63 y.o. male  with a history of CAD s/p CABG 2005, chronic atrial fibrillation on Eliquis , ESRD s/p renal transplant 2012, and chronic diastolic CHF.  He has a history of ischemic cardiomyopathy with EF as low as 35-40% in the past but echo in 3/20 showed EF 60-65%.  There is evidence for RV failure with pulmonary hypertension by echo.    Admitted 11/05/18 low grade fever and shortness of breath. HS Trop negative. Covid 19 negative. Blood CX negative. Placed on antibiotics and discharged to home the next day.   Admitted 05/03/19 with A/C Diastolic HF. Diuresed with IV lasix  and transitioned to torsemide  80/40 mg . Discharge weight 252 pounds.   Admitted 06/2019 with left arm pain. Left upper extremity venous Doppler revealed occluded cephalic vein at the AV fistula outflow tract in the antecubital fossa with no other thrombus identified. Vascular evaluated. He continued on Eliquis .  No plan for intervention. Also treated for acute gout flare. Discharged on prednisone  taper.     Given IV lasix  on 12/21 by HF Paramedicine.   Admitted to [facility] 2/22 with acute gout flare vs cellulitis. Nephrology was consulted to evaluate transplant regimen and follow along. He was treated with antibiotics and prednisone . Discharged on decreased torsemide  dose of 40 mg daily. Weight 259 lbs.  Admitted 4/22 for left leg cellulitis after sustaining a dog scratch, received IV antibiotics. No changes to his diuretic therapy. Discharge weight 266 lbs.  Follow up with Dr. Darron, 1/23, he had CP and underwent Lexiscan  myoview ,  which overall was a low risk study with evidence of prior infarct in the infero-apical area with no significant change from before. Losartan  added to GDMT regimen.  Follow up 5/23, volume overloaded, REDs 40%. Advised taking additional metolazone  (3 doses that week), however labs showed worsening renal function and advised to keep at 2 doses.  Admitted in 5/23 with acute on chronic CHF and AKI.  Spiro, Farxiga  and losartan  stopped. Diuresed with IV lasix . Echo showed EF 40-45%, RV mildly reduced, GIIIDD (restrictive), IVC dilated. Nephrology consulted for diuretic dosing. Discharged home, weight 266 lbs.  He had chest pain in 8/23 and was admitted at [facility], minimal HS-TnI elevation with no trend, thought to be demand ischemia from volume overload.    Echo 8/23 showing EF 40-45%, diffuse hypokinesis with dyssynchrony, mildly dilated RV with mildly decreased systolic function, IVC dilated, moderate AS with mean gradient 15, mild-moderate mitral stenosis with mean gradient 6.   Follow up 03/27/22, volume overloaded with 25 lbs weight gain. Instructed to use Furoscix  x 3 days, then resume home dose of torsemide  + metolazone . Clinic weight 277 lbs.  Admitted from [facility] 04/01/22 with a/c CHF. Echo showed EF 40-45%, RV moderately reduced and RVSP 49 mmHg. RHC showed elevated filling pressures, moderate primarily pulmonary venous hypertension and preserved CO. Tadalafil  stopped with primarily PH group 2 and 3. He was diuresed with IV lasix  and metolazone , and GDMT added back. Overall down 19 lbs. He was discharged home, weight 255 lbs.  Admitted in 3/24 with Serratia bacteremia/pyelonephritis, treated  with ertapenem .  He also had campylobacter diarrhea.  In 4/24, he tripped getting off his riding lawnmower and fell, fracturing ribs. He had a pulmonary contusion with this.   Admitted 11/24 with slurred speech and profuse diarrhea.  MRI brain showed no acute stroke.  He had AKI on CKD stage IV with creatinine up to  4.3, presentation thought to be due to diarrhea with dehydration related to use of Mounjaro .  He was given IV fluid and diuretics were stopped.  Losartan  and spironolactone  were stopped. He was sent home without diuretics.   Echo in 12/24 was stable with EF 45-50%, low normal RV function, mild MR.  Zio monitor for bradycardia in 12/24 showed average HR 52, no pauses, 4.7% PVCs.   Patient fell while on a cruise in 1/25 and broke ribs on the right (mechanical fall).  He was later admitted in 1/25 with influenza A and suspected development of secondary bacterial PNA.  6/25 had another mechanical fall and fractured his shoulder.   Admitted 7/25 with another mechanical fall & subsequent severe persistent epistaxis. Eliquis  held. He required nasal packing by ENT.  Initial head CT demonstrated small volume hemorrhage layering in the maxillary sinuses, and fluid in the visible pharynx. He had an acute change in mental status, with right-sided weakness. Code stroke called. Repeat head CT negative. MRI of the brain showed mild nonspecific sinus disease with mild air-fluid levels which can be seen in acute sinusitis, with no evidence of stroke. EEG without evidence of seizure  or epileptiform discharges. Symptoms resolved. Echo showed EF 40 to 45%, RV systolic function not well visualized. He was diuresed and GDMT titrated, but initially limited by elevated creatinine. Nasal packing removed and he was instructed to remain off of Eliquis  until f/u. At hospital follow up in the [facility], referral was placed to EP for consideration for Watchman. He has seen Dr. Cindie for initial consultation and had f/u arranged again this coming January to further discuss.   He presents back today for post ED f/u. Seen 11/3 for chest pain. Hs trop 35>>32. BNP was elevated at 544. CXR showed no acute findings. Instructed to f/u in [facility].   In clinic today, ReDs is elevated at 41%. He denies resting dyspnea. Reports stable NYHA Class II  symptoms. C/w atypical sharp left sided chest pain. No triggers nor exacerbating features. Not worse w/ exertion. Not pleuritic. No association w/ meals. No reproducible/ worsened w/ palpation. EKG done in clinic shows no ST changes. He reports full compliance w/ Eliquis .   Overall, he has lost wt w/ Mounjaro .   He is back on Eliquis . Restarted med ~2 months ago. Denies any further falls. No gross bleeding.    REDS clip 41%, abnormal   ECG (personally reviewed): NSR no acute ST changes   Labs (1/24): K 4.6, creatinine 2.44 Labs (2/24): K 3.4, creatinine 2.75, LDL 25 Labs (4/24): K 4.1, creatinine 2.46 Labs (5/24): K 2.7, creatinine 2.25, IgG monoclonal protein on myeloma panel.  Labs (6/24): K 3.8, creatinine 2.58 Labs (9/24): BNP 265, K 4.1, creatinine 2.83, LFTs normal, hgb 13.6 Labs (11/24): K 3.3 => 3.7, creatinine 4.3 => 1.88 => 2.08 Labs (1/25): LDL 17, K 4.9, creatinine 3.3 Labs (5/25): BNP 241, K 3.8, creatinine 2.59, LDL 18 Labs (7/25): K 4.2, creatinine 2.96 Labs (11/25): K 3.6, creatinine 2.02   PMH: 1. CAD: s/p CABG x 4 in [place]  in 2005.  - Cardiolite (1/20): EF 47%, small fixed apical septal defect, no ischemia.  Low risk study.  - Cardiolite (12/22): small apical inferior defect, no changes from prior, no ischemia. Low risk study. 2. Asthma 3. Gout 4. Atrial fibrillation: Chronic.  5. ESRD s/p renal transplantation at [facility] in 2012.  6. Anemia of chronic disease 7. Type II diabetes 8. HTN 9. Hyperlipidemia 10. OSA: On bipap.  11. Ischemic cardiomyopathy: EF 35-40% in past.  - Cardiolite (1/20) with EF 47%.  - Echo (1/20): EF 50-55%, mild MR, RV moderately dilated with moderately decreased systolic function, PASP 73 mmHg, moderate TR.   - Echo (3/20): EF 60-65%, PASP 75 mmHg, mildly dilated RV with mildly decreased systolic function.  - Echo (2/21): EF 45-50%, global HK, moderately dilated RV with moderately decreased systolic function, D-shaped  interventricular septum, dilated IVC.  - RHC (2/21): mean RA 19, PA 78/31 mean 51, mean PCWP 26, CI 3.26, PVR 3.25 - Echo (5/23): EF 40-45%, RV mildly reduced, GIIIDD (restrictive) - Echo (8/23): EF 40-45%, diffuse hypokinesis with dyssynchrony, mildly dilated RV with mildly decreased systolic function, IVC dilated, moderate AS with mean gradient 15, mild-moderate mitral stenosis with mean gradient 6.  - Echo (1/24): Echo showed EF 40-45%, RV moderately reduced and RVSP 49 mmHg, mild MR, no AS, dilated IVC.  - RHC (1/24): RA mean 13, PA 62/24 (mean 40), PCWP mean 20, CO/CI (Fick) 7.63/3.26, PVR 2.6 WU, PAPi 2.9 - Echo (12/24): EF 45-50%, low normal RV function, mild MR. - Echo 7/25: EF 40-45%, RV not well visualized 12. Obesity: s/p gastric bypass in 2015.  13. Bradycardia - Zio monitor (12/24): average HR 52, no pauses, 4.7% PVCs 14. Aortic stenosis: Moderate on 8/23 echo.  15. Mitral stenosis: Mild to moderate on 8/23 echo.  16. Rheumatoid arthritis 17. IgG monoclonal gammopathy 18. PAD: ABIs (10/24) with noncompressible ABIs, normal TBIs.  19. H/o Bell's palsy  Past Surgical History:  Procedure Laterality Date   CARDIAC CATHETERIZATION N/A 08/10/2002   CATARACT EXTRACTION W/PHACO Right 10/29/2017   Procedure: CATARACT EXTRACTION PHACO AND INTRAOCULAR LENS PLACEMENT (IOC)  RIGHT DIABETIC;  Surgeon: Mittie Gaskin, MD;  Location: [facility];  Service: Ophthalmology;  Laterality: Right   CATARACT EXTRACTION W/PHACO Left 11/18/2017   Procedure: CATARACT EXTRACTION PHACO AND INTRAOCULAR LENS PLACEMENT (IOC) LEFT IVA/TOPICAL;  Surgeon: Mittie Gaskin, MD;  Location: [facility];  Service: Ophthalmology;  Laterality: Left   COLONOSCOPY WITH PROPOFOL  N/A 04/22/2013   Procedure: COLONOSCOPY WITH PROPOFOL ;  Surgeon: Toribio SHAUNNA Cedar, MD;  Location: [facility];  Service: Endoscopy;  Laterality: N/A;   CORONARY ANGIOPLASTY WITH STENT PLACEMENT N/A 1996   CORONARY  ARTERY BYPASS GRAFT N/A 08/13/2002   Procedure: 4v CORONARY ARTERY BYPASS GRAFTING; Location: [facility]; Surgeon: Dessa Laine, MD   CYSTOSCOPY WITH INSERTION OF UROLIFT N/A 12/25/2021   Procedure: CYSTOSCOPY WITH INSERTION OF UROLIFT;  Surgeon: Twylla Glendia BROCKS, MD;  Location: [facility];  Service: Urology;  Laterality: N/A;   DG ANGIO AV SHUNT*L*     right and left upper arms   FASCIOTOMY  03/03/2012   Procedure: FASCIOTOMY;  Surgeon: Arley JONELLE Curia, MD;  Location: [facility];  Service: Orthopedics;  Laterality: Right;  FASCIOTOMY RIGHT SMALL FINGER   FASCIOTOMY Left 08/17/2013   Procedure: FASCIOTOMY LEFT RING;  Surgeon: Arley JONELLE Curia, MD;  Location: [facility];  Service: Orthopedics;  Laterality: Left;   INCISION AND DRAINAGE ABSCESS Left 10/15/2015   Procedure: INCISION AND DRAINAGE ABSCESS;  Surgeon: Laneta JULIANNA Luna, MD;  Location: [facility];  Service: General;  Laterality: Left;   KIDNEY TRANSPLANT  09/13/2010   cadaver--at [facility]   LASER ABLATION CONDOLAMATA N/A 01/08/2023   Procedure: LASER REMOVAL ABLATION OF CONDYLOMATA;  Surgeon: Sheldon Standing, MD;  Location: [facility];  Service: General;  Laterality: N/A;  GEN AND LOCAL   LASER ABLATION CONDOLAMATA N/A 11/20/2023   Procedure: ABLATION, CONDYLOMA, USING LASER HEMORRHOIDAL LIGATION & HEMORRHOIDOPEXY, HEMORRHOIDECTOMY x 2;  Surgeon: Sheldon Standing, MD;  Location: [facility];  Service: General;  Laterality: N/A;  LASER REMOVAL ABLATION OF CONDYLOMATA ANORECTAL EXAMINATION UNDER ANESTHESIA   RECTAL EXAM UNDER ANESTHESIA N/A 01/08/2023   Procedure: ANORECTAL EXAM UNDER ANESTHESIA;  Surgeon: Sheldon Standing, MD;  Location: [facility];  Service: General;  Laterality: N/A;   RECTAL EXAM UNDER ANESTHESIA N/A 11/20/2023   Procedure: EXAM UNDER ANESTHESIA, RECTUM;  Surgeon: Sheldon Standing, MD;  Location: [facility];  Service: General;  Laterality: N/A;   RIGHT HEART CATH N/A 11/15/2016   Procedure: RIGHT HEART CATH;  Surgeon: Darron Deatrice LABOR, MD;  Location: [facility];  Service: Cardiovascular;  Laterality: N/A;   RIGHT HEART CATH N/A 05/03/2019   Procedure: RIGHT HEART CATH;  Surgeon: [name], MD;  Location: [facility];  Service: Cardiovascular;  Laterality: N/A;   RIGHT HEART CATH N/A 04/04/2022   Procedure: RIGHT HEART CATH;  Surgeon: [name], MD;  Location: [facility] IN[facility]AS[facility] CV LAB;  Service: Cardiovascular;  Laterality: N/A;   ROUX-EN-Y GASTRIC BYPASS N/A 2015   TYMPANIC MEMBRANE REPAIR Left 03/2010   VASECTOMY     WART FULGURATION N/A 05/31/2022   Procedure: FULGURATION ANAL WART;  Surgeon: Tye Millet, DO;  Location: [facility];  Service: General;  Laterality: N/A;   Current Outpatient Medications  Medication Sig Dispense Refill   albuterol  (PROVENTIL ) (2.5 MG/3ML) 0.083% nebulizer solution Take 3 mLs (2.5 mg total) by nebulization every 6 (six) hours as needed for wheezing or shortness of breath. 150 mL 0   albuterol  (VENTOLIN  HFA) 108 (90 Base) MCG/ACT inhaler Inhale 2 puffs into the lungs every 6 (six) hours as needed for wheezing or shortness of breath. 6.7 g 5   aluminum-magnesium  hydroxide-simethicone  (MAALOX) 200-200-20 MG/5ML SUSP Take 30 mLs by mouth 4 (four) times daily -  before meals and at bedtime. 1680 mL 0   amoxicillin  (AMOXIL ) 500 MG capsule Take 4 capsules (2,000) 30-60 min prior to dental appointment. 4 capsule 5   apixaban  (ELIQUIS ) 5 MG TABS tablet Take 1 tablet (5 mg total) by mouth 2 (two) times daily.     buPROPion  (WELLBUTRIN  SR) 150 MG 12 hr tablet Take 1 tablet (150 mg total) by mouth 2 (two) times daily. 180 tablet 3   colchicine  0.6 MG tablet Take 0.6 mg by mouth every other day.     colchicine  0.6 MG tablet Take 1 tablet (0.6 mg total) by mouth daily. 90 tablet 1   Continuous Glucose Sensor (FREESTYLE LIBRE 3 PLUS SENSOR) MISC Use 1 each every 15 (fifteen) days 6 each 3   Cyanocobalamin  (B-12 PO) Take 1 tablet by mouth daily.     dapagliflozin  propanediol  (FARXIGA ) 10 MG TABS tablet Take 1 tablet (10 mg total) by mouth daily before breakfast. 90 tablet 3   diazepam  (VALIUM ) 5 MG tablet Take 1 tablet (5 mg total) by mouth every 8 (eight) hours as needed for muscle spasms (difficulty urinating). 6 tablet 1   febuxostat  (ULORIC ) 40 MG tablet Take 3 tablets (120 mg total) by mouth daily. 90  tablet 5   fluticasone  (FLONASE ) 50 MCG/ACT nasal spray Place 2 sprays into both nostrils daily. (Patient taking differently: Place 2 sprays into both nostrils daily as needed for allergies or rhinitis.) 16 g 11   gabapentin  (NEURONTIN ) 300 MG capsule Take 1 capsule (300 mg total) by mouth 2 (two) times daily. 180 capsule 3   hydrALAZINE  (APRESOLINE ) 25 MG tablet Take 1 tablet (25 mg total) by mouth 3 (three) times daily. 90 tablet 5   HYDROcodone -acetaminophen  (NORCO) 10-325 MG tablet TAKE 1 TABLET BY MOUTH EVERY 4 HOURS FOR 3 DAYS AS NEEDED FOR PAIN     insulin  glargine-yfgn (SEMGLEE ) 100 UNIT/ML Pen Inject 20 Units into the skin daily. (Patient taking differently: Inject 14 Units into the skin daily.) 45 mL 3   insulin  glargine-yfgn (SEMGLEE ) 100 UNIT/ML Pen Inject 20 Units into the skin daily. 45 mL 3   insulin  glargine-yfgn (SEMGLEE ) 100 UNIT/ML Pen Inject 24 Units subcutaneously once daily. 45 mL 3   Insulin  Pen Needle (TECHLITE PEN NEEDLES) 31G X 5 MM MISC 4 (four) times daily. 400 each 3   Insulin  Pen Needle (UNIFINE PENTIPS) 31G X 5 MM MISC Use 4 times daily as directed 400 each 3   lisinopril  (ZESTRIL ) 40 MG tablet Take 40 mg by mouth.     metolazone  (ZAROXOLYN ) 2.5 MG tablet Take 1 tablet (2.5 mg total) by mouth as directed by the heart failure clinic. 30 tablet 1   montelukast  (SINGULAIR ) 10 MG tablet Take 1 tablet (10 mg total) by mouth at bedtime. 30 tablet 11   mycophenolate  (MYFORTIC ) 180 MG EC tablet Take 2 tablets (360 mg total) by mouth every morning AND 1 tablet (180 mg total) every evening. 360 tablet 3   omeprazole  (PRILOSEC) 20 MG capsule Take 1  capsule (20 mg total) by mouth daily. 30 capsule 11   omeprazole  (PRILOSEC) 20 MG capsule Take 1 capsule (20 mg total) by mouth daily for 14 days. 14 capsule 0   oxyCODONE -acetaminophen  (PERCOCET) 10-325 MG tablet Take 1 tablet by mouth every 6 (six) hours as needed for up to 5 days. 25 tablet 0   predniSONE  (DELTASONE ) 5 MG tablet Take 1 tablet (5 mg total) by mouth daily. 90 tablet 3   rosuvastatin  (CRESTOR ) 10 MG tablet Take 1 tablet (10 mg total) by mouth daily. 90 tablet 3   sodium chloride  (OCEAN) 0.65 % SOLN nasal spray Place 2 sprays into both nostrils every 8 (eight) hours for 10 days. (Patient taking differently: Place 2 sprays into both nostrils as needed for congestion.)     tacrolimus  (PROGRAF ) 1 MG capsule Take 1 mg by mouth.     tadalafil  (CIALIS ) 20 MG tablet Take 0.5-1 tablets (10-20 mg total) by mouth every other day as needed for ere 10 tablet 11   tadalafil  (CIALIS ) 5 MG tablet Take 5 mg by mouth daily as needed for erectile dysfunction.     tamsulosin  (FLOMAX ) 0.4 MG CAPS capsule Take 1 capsule (0.4 mg total) by mouth daily. 30 capsule 11   Testosterone  Enanthate (XYOSTED) 75 MG/0.5ML SOAJ Inject 0.5 mLs into the skin once a week. 2 mL 0   tirzepatide  (MOUNJARO ) 7.5 MG/0.5ML Pen Inject 7.5 mg into the skin once a week. 6 mL 1   torsemide  (DEMADEX ) 100 MG tablet Take 1 tablet (100 mg total) by mouth 2 (two) times daily. 90 tablet 1   traMADol  (ULTRAM ) 50 MG tablet Take One tab PO Q6 hours PRN pain     traMADol  (  ULTRAM ) 50 MG tablet Take 1 tablet (50 mg total) by mouth every 6 (six) hours as needed for pain. 40 tablet 0   triamcinolone  (KENALOG ) 0.025 % cream Apply 1-2 Applications topically daily to sandpaper areas on legs. 454 g 5   Zinc  50 MG TABS Take 1 tablet (50 mg total) by mouth daily. 100 tablet 3   No current facility-administered medications for this encounter.   Allergies:   Iodinated contrast media, Iodine, Nsaids, Other, and Ibuprofen   Social History:  The  patient  reports that he has never smoked. He has never used smokeless tobacco. He reports that he does not currently use alcohol. He reports that he does not use drugs.   Family History:  The patient's family history includes Breast cancer in his maternal grandmother; Diabetes in his maternal grandmother; Heart disease in his father; Kidney disease in his father; Kidney failure in his father; Liver cancer in his paternal grandmother and paternal uncle; Valvular heart disease in his mother.   ROS:  Please see the history of present illness.   All other systems are personally reviewed and negative.   Wt Readings from Last 3 Encounters:  01/28/24 110 kg (242 lb 6.4 oz)  01/26/24 107 kg (235 lb 14.3 oz)  01/08/24 107.5 kg (237 lb)    Pulse (!) 56   Ht 5' 10 (1.778 m)   Wt 110 kg (242 lb 6.4 oz)   SpO2 (!) 89%   BMI 34.78 kg/m   Physical Exam  ReDs 41% GENERAL: NAD Lungs- diminished at the bases bilaterally  CARDIAC:  JVP 8-9 cm          Irregularly irregular regular rhythm. No MRG. 1+ b/l pretibial edema  ABDOMEN: Soft, non-tender, non-distended.  EXTREMITIES: Warm and well perfused.  NEUROLOGIC: No obvious FND   Assessment & Plan: 1. Acute on Chronic heart failure with Mid Range EF/ With prominent RV failure/pulmonary hypertension:  Ischemic cardiomyopathy.  ?If RV failure is related to severe OSA, he was a remote smoker. Echo in 2/21 with EF 45-50%, moderately dilated RV with moderately decreased systolic function, moderate pulmonary hypertension. Echo in 12/24 showed EF 45-50%, low normal RV function, mild MR. Echo 7/25 EF 40-45%, RV not well visualized. Management complicated by CKD stage 4.  Stable NYHA Class III. Volume overloaded on exam and by ReDs at 41% - Continue torsemide  100 mg bid - instructed to take 2.5 mg of metolazone  today and again on Friday w/ KCl supp. Suspect he may need once weekly metolazone  to keep euvolemic. Will reassess volume status at upcomming appt in 2  wks  - Continue Farxiga  10 mg daily - Continue losartan  25 mg daily.  - No spironolactone  with CKD stage IV.  - Check BMP in 1 wk  2. Atrial fibrillation: Chronic. Rate controlled.  - Continue Eliquis   5 mg bid. He would like to eventually stop anticoagulation given numerous mechanical falls. 4/24 he fell getting off his riding lawnmower, fracturing ribs. 1/25 fell while on a cruise and broke ribs on the right. 6/25 mechanical fall resulting in left shoulder fx, 7/25 mechanical fall w/ subsequent severe epistaxis requiring nasal packing.  - Referred to EP for consideration for Watchman (Dr. Cindie). He has been advised to use extreme caution w/ ambulation.  - Not on beta blocker with h/o bradycardia.   3. CAD: S/p CABG 2005.  Cardiolite 12/22 with prior MI, no ischemia. High threshold for cath given CKD. He denies ischemic CP symptoms. Seen in  ED for r/o 11/3 and Hs trops low level/flat x 2  - No ASA given Eliquis  use.  - Continue Crestor  10 mg daily. Good lipids in 1/25.   4. CKD Stage 4: S/p renal transplantation in 2012. Followed by transplant center at [facility]. He remains on mycophenolate  and tacrolimus .   - Continue SGLT2i. - reviewed BMP from ED 2 days ago. SCr and K stable. Plan repeat BMP in 1 wk given diuretic increase  5. OSA: Reports compliance w/ BiPAP. - No change. 6. HTN: moderately elevated - Continue current dose of losartan . - increase hydralazine  to 25 mg tid (currently only taking once daily) 7. DM 2: On insulin . Managed by Endocrinology. - Continue dapagliflozin  for now but watch for further GU symptoms. - He is on tirzepatide . 8. Pulmonary hypertension: Primarily pulmonary venous hypertension by RHC in 1/24 though there may be group 3 PH component from OSA.  9. Chronic Anemia: Gets iron  infusions.  10. IgG monoclonal protein: followed by hematologist.  11. Bradycardia: Suspect tachy-brady syndrome. HR generally in 50s-60s.  Zio monitor in 12/24 did not show  worrisome bradyarrhythmias (avg HR in 50s, no long pauses). He denies any syncope or dizziness associated w/ recent falls (all seem strictly mechanical). Today's EKG shows chronic Afib 55 bpm  - No nodal blockers.  - May need pacemaker in future.   Increase Hydralazine  to 25 mg tid. Take metolazone  2.5 mg today and repeat again on Friday w/ extra KCl. F/u BMP and BNP in 1 wk. Keep f/u w/ Dr. Rolan in 2 wks.    Caffie Shed, PA-C  01/28/2024

## 2024-01-28 NOTE — Patient Instructions (Signed)
 Medication Changes:  INCREASE HYDRALAZINE  TO 25MG  THREE TIMES DAILY   TAKE METOLAZONE  2.5MG  TODAY AND AGAIN ON FRIDAY WITH EXTRA POTASSIUM   Lab Work:  Go over to the [facility]. Go pass the gift shop and have your blood work completed. IN ONE WEEK   We will only call you if the results are abnormal or if the provider would like to make medication changes.  No news is good news.  Follow-Up in: AS SCHEDULED WITH DR. ROLAN   At the [facility], you and your health needs are our priority. We have a designated team specialized in the treatment of Heart Failure. This Care Team includes your primary Heart Failure Specialized Cardiologist (physician), Advanced Practice Providers (APPs- Physician Assistants and Nurse Practitioners), and Pharmacist who all work together to provide you with the care you need, when you need it.   You may see any of the following providers on your designated Care Team at your next follow up:  Dr. Toribio Fuel Dr. Ezra Rolan Dr. Odis Brownie Greig Mosses, NP Caffie Shed, [place] [facility] [place], [place] Beckey Coe, NP Jordan Lee, NP Tinnie Redman, PharmD   Please be sure to bring in all your medications bottles to every appointment.   Need to Contact Us :  If you have any questions or concerns before your next appointment please send us  a message through [place] or call our office at [phone].    TO LEAVE A MESSAGE FOR THE NURSE SELECT OPTION 2, PLEASE LEAVE A MESSAGE INCLUDING: YOUR NAME DATE OF BIRTH CALL BACK NUMBER REASON FOR CALL**this is important as we prioritize the call backs  YOU WILL RECEIVE A CALL BACK THE SAME DAY AS LONG AS YOU CALL BEFORE 4:00 PM

## 2024-01-29 ENCOUNTER — Other Ambulatory Visit: Payer: Self-pay

## 2024-01-29 ENCOUNTER — Telehealth: Payer: Self-pay

## 2024-01-29 DIAGNOSIS — M25612 Stiffness of left shoulder, not elsewhere classified: Secondary | ICD-10-CM | POA: Diagnosis not present

## 2024-01-29 DIAGNOSIS — M542 Cervicalgia: Secondary | ICD-10-CM | POA: Diagnosis not present

## 2024-01-29 DIAGNOSIS — M25512 Pain in left shoulder: Secondary | ICD-10-CM | POA: Diagnosis not present

## 2024-01-29 NOTE — Telephone Encounter (Signed)
 Copied from CRM [id number]. Topic: General - Other >> Jan 29, 2024 10:08 AM Macario HERO wrote: Reason for CRM: Patient called regarding FMLA paperwork. Patient is requesting a call back from nurse.

## 2024-01-29 NOTE — Telephone Encounter (Signed)
 Spoke with patient. He would like to extend [facility] for spouse. Pt said forms were faxed to our office, I have not received them as of today.  Pt will bring forms in for completion.   I Informed pt.he would need to schedule an appointment with Dr. Rilla, as previous forms were completed by Dr. Jimmy.

## 2024-01-29 NOTE — Telephone Encounter (Signed)
 Copied from CRM #[id number]. Topic: General - Other >> Jan 29, 2024  3:56 PM Anairis L wrote: Reason for CRM: Calling following FMLA paperwork, paperwork being faxed in now .Wife would like  it to be ready for patient app 02/03/2024 if possible due to work urgency.   Thank you!!  Please fax back to [employer] [phone]

## 2024-01-30 ENCOUNTER — Encounter: Payer: Self-pay | Admitting: Family Medicine

## 2024-01-30 DIAGNOSIS — Z0279 Encounter for issue of other medical certificate: Secondary | ICD-10-CM

## 2024-01-30 NOTE — Telephone Encounter (Signed)
 Completed forms received and faxed to [employer] at [phone]  Copy sent to scan  Copy left at front desk for patients records  Pt notified by phone

## 2024-01-30 NOTE — Telephone Encounter (Addendum)
 Filled out and returned to [place].

## 2024-01-30 NOTE — Telephone Encounter (Signed)
 FMLA extension forms received  Previous forms completed by Dr. Jimmy  Patient has scheduled appointment with Dr. Rilla on 11.11.25   Forms will be faxed to [employer] at [phone] when complete  Forms placed in providers box for review

## 2024-02-02 ENCOUNTER — Encounter: Admitting: Cardiology

## 2024-02-03 ENCOUNTER — Inpatient Hospital Stay: Admitting: Family Medicine

## 2024-02-03 DIAGNOSIS — M25512 Pain in left shoulder: Secondary | ICD-10-CM | POA: Diagnosis not present

## 2024-02-03 DIAGNOSIS — M542 Cervicalgia: Secondary | ICD-10-CM | POA: Diagnosis not present

## 2024-02-03 DIAGNOSIS — M25612 Stiffness of left shoulder, not elsewhere classified: Secondary | ICD-10-CM | POA: Diagnosis not present

## 2024-02-04 ENCOUNTER — Ambulatory Visit: Admitting: Oncology

## 2024-02-04 ENCOUNTER — Other Ambulatory Visit

## 2024-02-04 ENCOUNTER — Inpatient Hospital Stay: Admitting: Family Medicine

## 2024-02-04 DIAGNOSIS — Z7960 Long term (current) use of unspecified immunomodulators and immunosuppressants: Secondary | ICD-10-CM | POA: Diagnosis not present

## 2024-02-04 DIAGNOSIS — M0579 Rheumatoid arthritis with rheumatoid factor of multiple sites without organ or systems involvement: Secondary | ICD-10-CM | POA: Diagnosis not present

## 2024-02-04 DIAGNOSIS — M109 Gout, unspecified: Secondary | ICD-10-CM | POA: Diagnosis not present

## 2024-02-08 NOTE — Progress Notes (Unsigned)
  Electrophysiology Office Note:   Date:  02/09/2024  ID:  Carl Zoll., DOB [birth year], MRN [id number]  Primary Cardiologist: Deatrice Cage, MD Electrophysiologist: OLE ONEIDA HOLTS, MD   Electrophysiologist:  OLE ONEIDA HOLTS, MD  Advanced Heart Failure:  Ezra Shuck, MD      History of Present Illness:   Carl Zadok Holaway. is a 63 y.o. male with h/o coronary artery disease with prior CABG in 2005, chronic systolic heart failure, hypertension, kidney transplant in 2012, gastric bypass, diabetes and pancytopenia seen today for routine electrophysiology followup.   Pending Watchman 02/26/2024 in setting of falls and epistaxis. Plan TEE day of with CKD.   Since last being seen in our clinic the patient reports doing well overall. He is doing PT for his h/o mechanical falls and previously broken shoulder. Seen in ED earlier this month for what was felt to be acid reflux. Otherwise, he denies chest pain, palpitations, dyspnea, PND, orthopnea, nausea, vomiting, dizziness, syncope, edema, weight gain, or early satiety.   Review of systems complete and found to be negative unless listed in HPI.   EP Information / Studies Reviewed:    EKG is ordered today. Personal review as below.  EKG Interpretation Date/Time:  Monday February 09 2024 09:12:44 EST Ventricular Rate:  59 PR Interval:    QRS Duration:  164 QT Interval:  502 QTC Calculation: 496 R Axis:   143  Text Interpretation: with frequent Premature ventricular complexes in a pattern of bigeminy Right bundle branch block Left posterior fascicular block Bifascicular block Confirmed by [name] Carl [phone]) on 02/09/2024 9:16:55 AM    Arrhythmia/Device History [facility] A[facility] MD: Dr. Shuck    Physical Exam:   VS:  BP (!) 156/79   Pulse 69   Ht 5' 10 (1.778 m)   Wt 248 lb 4.8 oz (112.6 kg)   SpO2 95%   BMI 35.63 kg/m    Wt Readings from Last 3 Encounters:  02/09/24 248 lb 4.8 oz (112.6 kg)  01/28/24 242 lb  6.4 oz (110 kg)  01/26/24 235 lb 14.3 oz (107 kg)     GEN: No acute distress NECK: No JVD; No carotid bruits CARDIAC: Irregularly irregular rate and rhythm, no murmurs, rubs, gallops RESPIRATORY:  Clear to auscultation without rales, wheezing or rhonchi  ABDOMEN: Soft, non-tender, non-distended EXTREMITIES:  No edema; No deformity   ASSESSMENT AND PLAN:    Atrial fibrillation Falls Epistaxis Secondary hypercoagulable state Planning for Watchman 02/26/2024 No CT planned, TEE day of given CKD.  Cleared by nephrology for cautious use of contrast during the case  Previously has had shared decision discussion with Dr. Holts and remains agreeable to proceed with LAAO.   CAD s/p CABG No s/s of ischemia.      Follow up with EP Team as usual post procedure  Signed, Carl Prentice Lesia, PA-C

## 2024-02-09 ENCOUNTER — Ambulatory Visit (INDEPENDENT_AMBULATORY_CARE_PROVIDER_SITE_OTHER): Admitting: Family Medicine

## 2024-02-09 ENCOUNTER — Telehealth: Payer: Self-pay

## 2024-02-09 ENCOUNTER — Encounter: Payer: Self-pay | Admitting: Family Medicine

## 2024-02-09 ENCOUNTER — Other Ambulatory Visit: Payer: Self-pay

## 2024-02-09 ENCOUNTER — Encounter: Payer: Self-pay | Admitting: Student

## 2024-02-09 ENCOUNTER — Ambulatory Visit: Attending: Student | Admitting: Student

## 2024-02-09 VITALS — BP 156/79 | HR 69 | Ht 70.0 in | Wt 248.3 lb

## 2024-02-09 VITALS — BP 120/60 | HR 59 | Temp 97.8°F | Ht 70.0 in | Wt 245.5 lb

## 2024-02-09 DIAGNOSIS — Z9884 Bariatric surgery status: Secondary | ICD-10-CM

## 2024-02-09 DIAGNOSIS — M1A09X1 Idiopathic chronic gout, multiple sites, with tophus (tophi): Secondary | ICD-10-CM

## 2024-02-09 DIAGNOSIS — K219 Gastro-esophageal reflux disease without esophagitis: Secondary | ICD-10-CM

## 2024-02-09 DIAGNOSIS — I5022 Chronic systolic (congestive) heart failure: Secondary | ICD-10-CM

## 2024-02-09 DIAGNOSIS — E1122 Type 2 diabetes mellitus with diabetic chronic kidney disease: Secondary | ICD-10-CM

## 2024-02-09 DIAGNOSIS — I255 Ischemic cardiomyopathy: Secondary | ICD-10-CM

## 2024-02-09 DIAGNOSIS — N1832 Chronic kidney disease, stage 3b: Secondary | ICD-10-CM

## 2024-02-09 DIAGNOSIS — D509 Iron deficiency anemia, unspecified: Secondary | ICD-10-CM

## 2024-02-09 DIAGNOSIS — N184 Chronic kidney disease, stage 4 (severe): Secondary | ICD-10-CM

## 2024-02-09 DIAGNOSIS — Z8669 Personal history of other diseases of the nervous system and sense organs: Secondary | ICD-10-CM

## 2024-02-09 DIAGNOSIS — M064 Inflammatory polyarthropathy: Secondary | ICD-10-CM

## 2024-02-09 DIAGNOSIS — G4733 Obstructive sleep apnea (adult) (pediatric): Secondary | ICD-10-CM

## 2024-02-09 DIAGNOSIS — D849 Immunodeficiency, unspecified: Secondary | ICD-10-CM | POA: Diagnosis not present

## 2024-02-09 DIAGNOSIS — I482 Chronic atrial fibrillation, unspecified: Secondary | ICD-10-CM

## 2024-02-09 DIAGNOSIS — I25118 Atherosclerotic heart disease of native coronary artery with other forms of angina pectoris: Secondary | ICD-10-CM

## 2024-02-09 DIAGNOSIS — Z794 Long term (current) use of insulin: Secondary | ICD-10-CM

## 2024-02-09 DIAGNOSIS — L97921 Non-pressure chronic ulcer of unspecified part of left lower leg limited to breakdown of skin: Secondary | ICD-10-CM

## 2024-02-09 DIAGNOSIS — E291 Testicular hypofunction: Secondary | ICD-10-CM

## 2024-02-09 DIAGNOSIS — M720 Palmar fascial fibromatosis [Dupuytren]: Secondary | ICD-10-CM

## 2024-02-09 DIAGNOSIS — Z7960 Long term (current) use of unspecified immunomodulators and immunosuppressants: Secondary | ICD-10-CM

## 2024-02-09 DIAGNOSIS — Z940 Kidney transplant status: Secondary | ICD-10-CM

## 2024-02-09 LAB — CBC

## 2024-02-09 MED ORDER — MONTELUKAST SODIUM 10 MG PO TABS
10.0000 mg | ORAL_TABLET | Freq: Every day | ORAL | 11 refills | Status: DC
  Filled 2024-02-09: qty 30, 30d supply, fill #0
  Filled 2024-02-23: qty 30, 30d supply, fill #1

## 2024-02-09 NOTE — Patient Instructions (Addendum)
 Continue current medicines. Good to see you today Return as needed 4 months for follow up visit

## 2024-02-09 NOTE — Progress Notes (Unsigned)
 Ph: [phone] Fax: [phone]   Patient ID: Carl Mcgrory., male    DOB: October 02, 1960, 63 y.o.   MRN: [id number]  This visit was conducted in person.  BP 120/60   Pulse (!) 59 Comment: manual  Temp 97.8 F (36.6 C) (Oral)   Ht 5' 10 (1.778 m)   Wt 245 lb 8 oz (111.4 kg)   BMI 35.23 kg/m    CC: ER f/u visit  Subjective:   HPI: [name]. is a 63 y.o. male presenting on 02/09/2024 for Follow-up (Pt here for ED f/u )   Known chronic afib on eliquis , CHF with ischemic CM and EF 40-45% on torsemide , h/o gastric bypass, T2DM, CAD s/p CABG 2005, s/p renal transplant 2012 ([facility]) on immunosuppression (cellcept, tacrolimus , prednisone ). Also OSA on CPAP Mickael).   Recent ER visit 01/26/2024 for chest pain - s/p reassuring workup - BNP 500s, troponins 30s, EKG non-acute, treated with GI cocktail with symptomatic improvement. Discharged with Maalox and omeprazole  with improvement. Describes sharp left sided chest pain with radiation to the back.   Saw EP Ozell Passey PA earlier today - planned upcoming watchman procedure 02/26/2024.  He is back on torsemide  100mg  bid with metolazone  2.5mg  PRN weight gain (3 lbs). He states goal dry weight is 235lbs.   Saw rheum Dr Tobie 02/04/2024, note reviewed - stable period, continue Uloric  with PRN colchicine . Discussing injectable gout medicine Pegloticase. Seeing Q61mo.   Lower extremity ulcer - followed by wound clinic with benefit Debi Dustman).   Seeing hand surgeon ortho discussing possible surgery for right hand dupuytren's contracture.   Continues testosterone  topical replacement followed by urology.   DM - sees endo Dr Damian on mounjaro  7.5mg  weekly, with rare Semglee  insulin  use. Also on farxiga .  Lab Results  Component Value Date   HGBA1C 5.5 11/06/2023   Low energy, cold intolerance, falling asleep more easily - wonders if iron  is again low. Previously was receiving iron  infusions x3 months, last done  01/01/2024 venofer  200mg  x3 latest 01/15/2024. Planned rpt labs 02/17/2024     Relevant past medical, surgical, family and social history reviewed and updated as indicated. Interim medical history since our last visit reviewed. Allergies and medications reviewed and updated. Outpatient Medications Prior to Visit  Medication Sig Dispense Refill   albuterol  (PROVENTIL ) (2.5 MG/3ML) 0.083% nebulizer solution Take 3 mLs (2.5 mg total) by nebulization every 6 (six) hours as needed for wheezing or shortness of breath. 150 mL 0   albuterol  (VENTOLIN  HFA) 108 (90 Base) MCG/ACT inhaler Inhale 2 puffs into the lungs every 6 (six) hours as needed for wheezing or shortness of breath. 6.7 g 5   aluminum-magnesium  hydroxide-simethicone  (MAALOX) 200-200-20 MG/5ML SUSP Take 30 mLs by mouth 4 (four) times daily -  before meals and at bedtime. 1680 mL 0   amoxicillin  (AMOXIL ) 500 MG capsule Take 4 capsules (2,000) 30-60 min prior to dental appointment. 4 capsule 5   apixaban  (ELIQUIS ) 5 MG TABS tablet Take 1 tablet (5 mg total) by mouth 2 (two) times daily.     buPROPion  (WELLBUTRIN  SR) 150 MG 12 hr tablet Take 1 tablet (150 mg total) by mouth 2 (two) times daily. 180 tablet 3   colchicine  0.6 MG tablet Take 1 tablet (0.6 mg total) by mouth daily. 90 tablet 1   Continuous Glucose Sensor (FREESTYLE LIBRE 3 PLUS SENSOR) MISC Use 1 each every 15 (fifteen) days 6 each 3   Cyanocobalamin  (B-12 PO) Take 1 tablet  by mouth daily.     dapagliflozin  propanediol (FARXIGA ) 10 MG TABS tablet Take 1 tablet (10 mg total) by mouth daily before breakfast. 90 tablet 3   febuxostat  (ULORIC ) 40 MG tablet Take 3 tablets (120 mg total) by mouth daily. 90 tablet 5   fluticasone  (FLONASE ) 50 MCG/ACT nasal spray Place 2 sprays into both nostrils daily. 16 g 11   gabapentin  (NEURONTIN ) 300 MG capsule Take 1 capsule (300 mg total) by mouth 2 (two) times daily. 180 capsule 3   hydrALAZINE  (APRESOLINE ) 25 MG tablet Take 1 tablet (25 mg total)  by mouth 3 (three) times daily. 90 tablet 5   Insulin  Pen Needle (TECHLITE PEN NEEDLES) 31G X 5 MM MISC 4 (four) times daily. 400 each 3   Insulin  Pen Needle (UNIFINE PENTIPS) 31G X 5 MM MISC Use 4 times daily as directed 400 each 3   lisinopril  (ZESTRIL ) 40 MG tablet Take 40 mg by mouth.     metolazone  (ZAROXOLYN ) 2.5 MG tablet Take 1 tablet (2.5 mg total) by mouth as directed by the heart failure clinic. 30 tablet 1   mycophenolate  (MYFORTIC ) 180 MG EC tablet Take 2 tablets (360 mg total) by mouth every morning AND 1 tablet (180 mg total) every evening. 360 tablet 3   omeprazole  (PRILOSEC) 20 MG capsule Take 1 capsule (20 mg total) by mouth daily. 30 capsule 11   oxyCODONE -acetaminophen  (PERCOCET) 10-325 MG tablet Take 1 tablet by mouth every 6 (six) hours as needed for up to 5 days. 25 tablet 0   predniSONE  (DELTASONE ) 5 MG tablet Take 1 tablet (5 mg total) by mouth daily. 90 tablet 3   rosuvastatin  (CRESTOR ) 10 MG tablet Take 1 tablet (10 mg total) by mouth daily. 90 tablet 3   sodium chloride  (OCEAN) 0.65 % SOLN nasal spray Place 2 sprays into both nostrils every 8 (eight) hours for 10 days.     tacrolimus  (PROGRAF ) 1 MG capsule Take 1 mg by mouth.     tadalafil  (CIALIS ) 20 MG tablet Take 0.5-1 tablets (10-20 mg total) by mouth every other day as needed for ere 10 tablet 11   tamsulosin  (FLOMAX ) 0.4 MG CAPS capsule Take 1 capsule (0.4 mg total) by mouth daily. 30 capsule 11   Testosterone  Enanthate (XYOSTED) 75 MG/0.5ML SOAJ Inject 0.5 mLs into the skin once a week. 2 mL 0   tirzepatide  (MOUNJARO ) 7.5 MG/0.5ML Pen Inject 7.5 mg into the skin once a week. 6 mL 1   torsemide  (DEMADEX ) 100 MG tablet Take 1 tablet (100 mg total) by mouth 2 (two) times daily. 90 tablet 1   traMADol  (ULTRAM ) 50 MG tablet Take 1 tablet (50 mg total) by mouth every 6 (six) hours as needed for pain. 40 tablet 0   triamcinolone  (KENALOG ) 0.025 % cream Apply 1-2 Applications topically daily to sandpaper areas on legs.  454 g 5   Zinc  50 MG TABS Take 1 tablet (50 mg total) by mouth daily. 100 tablet 3   colchicine  0.6 MG tablet Take 0.6 mg by mouth every other day.     diazepam  (VALIUM ) 5 MG tablet Take 1 tablet (5 mg total) by mouth every 8 (eight) hours as needed for muscle spasms (difficulty urinating). 6 tablet 1   HYDROcodone -acetaminophen  (NORCO) 10-325 MG tablet TAKE 1 TABLET BY MOUTH EVERY 4 HOURS FOR 3 DAYS AS NEEDED FOR PAIN     insulin  glargine-yfgn (SEMGLEE ) 100 UNIT/ML Pen Inject 20 Units into the skin daily. 45 mL 3  insulin  glargine-yfgn (SEMGLEE ) 100 UNIT/ML Pen Inject 20 Units into the skin daily. 45 mL 3   montelukast  (SINGULAIR ) 10 MG tablet Take 1 tablet (10 mg total) by mouth at bedtime. 30 tablet 11   omeprazole  (PRILOSEC) 20 MG capsule Take 1 capsule (20 mg total) by mouth daily for 14 days. 14 capsule 0   tadalafil  (CIALIS ) 5 MG tablet Take 5 mg by mouth daily as needed for erectile dysfunction.     traMADol  (ULTRAM ) 50 MG tablet Take One tab PO Q6 hours PRN pain     insulin  glargine-yfgn (SEMGLEE ) 100 UNIT/ML Pen Inject 24 Units subcutaneously once daily. (Patient not taking: Reported on 02/09/2024) 45 mL 3   No facility-administered medications prior to visit.     Per HPI unless specifically indicated in ROS section below Review of Systems  Objective:  BP 120/60   Pulse (!) 59 Comment: manual  Temp 97.8 F (36.6 C) (Oral)   Ht 5' 10 (1.778 m)   Wt 245 lb 8 oz (111.4 kg)   BMI 35.23 kg/m   Wt Readings from Last 3 Encounters:  02/09/24 245 lb 8 oz (111.4 kg)  02/09/24 248 lb 4.8 oz (112.6 kg)  01/28/24 242 lb 6.4 oz (110 kg)      Physical Exam Vitals and nursing note reviewed.  Constitutional:      Appearance: Normal appearance. He is not ill-appearing.  HENT:     Head: Normocephalic and atraumatic.     Ears:     Comments: Chronic L facial droop    Mouth/Throat:     Mouth: Mucous membranes are moist.     Pharynx: Oropharynx is clear. No oropharyngeal exudate or  posterior oropharyngeal erythema.  Eyes:     Extraocular Movements: Extraocular movements intact.     Pupils: Pupils are equal, round, and reactive to light.  Cardiovascular:     Rate and Rhythm: Bradycardia present. Rhythm regularly irregular. Frequent Extrasystoles are present.    Pulses: Normal pulses.     Heart sounds: Normal heart sounds.     Comments: Bigeminy present Pulmonary:     Effort: Pulmonary effort is normal. No respiratory distress.     Breath sounds: No wheezing or rhonchi.     Comments:  Bibasilar crackles Musculoskeletal:     Right lower leg: Edema (tr) present.     Comments:  LLE wrapped in compression ace wrap  Skin:    General: Skin is warm and dry.  Neurological:     Mental Status: He is alert.  Psychiatric:        Mood and Affect: Mood normal.        Behavior: Behavior normal.       Results for orders placed or performed in visit on 02/09/24  Basic metabolic panel with GFR   Collection Time: 02/09/24 10:37 AM  Result Value Ref Range   Glucose 151 (H) 70 - 99 mg/dL   BUN 78 (HH) 8 - 27 mg/dL   Creatinine, Ser 7.29 (H) 0.76 - 1.27 mg/dL   eGFR 26 (L) >40 fO/fpw/8.26   BUN/Creatinine Ratio 29 (H) 10 - 24   Sodium 142 134 - 144 mmol/L   Potassium 3.5 3.5 - 5.2 mmol/L   Chloride 102 96 - 106 mmol/L   CO2 24 20 - 29 mmol/L   Calcium  9.1 8.6 - 10.2 mg/dL  CBC   Collection Time: 02/09/24 10:37 AM  Result Value Ref Range   WBC 3.3 (L) 3.4 - 10.8 x10E3/uL  RBC 3.56 (L) 4.14 - 5.80 x10E6/uL   Hemoglobin 10.4 (L) 13.0 - 17.7 g/dL   Hematocrit 66.6 (L) 62.4 - 51.0 %   MCV 94 79 - 97 fL   MCH 29.2 26.6 - 33.0 pg   MCHC 31.2 (L) 31.5 - 35.7 g/dL   RDW 84.4 (H) 88.3 - 84.5 %   Platelets 119 (L) 150 - 450 x10E3/uL   *Note: Due to a large number of results and/or encounters for the requested time period, some results have not been displayed. A complete set of results can be found in Results Review.    Assessment & Plan:   Problem List Items Addressed  This Visit     CAD s/p CABG 2005 - Primary   Continue rosuvastatin , eliquis .       Gout   Gout with tophi, followed by rheum Mathias) on Uloric  with PRN colchicine . To consider Pegloticase IV treatment.      OSA (obstructive sleep apnea)   Continue nightly CPAP followed by Dr Jess pulmonologist      Renal transplant, status post   Immunosuppressed status   Hypogonadism in male   On topical testosterone  replacement through urology.       Chronic heart failure with mildly reduced ejection fraction (HFmrEF, 41-49%) (HCC)   Continue torsemide  with PRN metolazone .  Regularly sees cardiology clinic.       GERD (gastroesophageal reflux disease)   Recent ER visit for chest pain deemed GI in nature, treated with maalox and omeprazole .       Chronic atrial fibrillation (HCC)   Continue eliquis   Regularly sees EP       Type II diabetes mellitus with renal manifestations (HCC)   Sees endo, recent good glycemic control with mounjaro  titration and continued farxiga , notes rare semglee  insulin  use.       Ischemic cardiomyopathy   Iron  deficiency anemia   This is regularly followed by hematology, with latest Venofer  200mg  infusions x3 12/2023.       H/O gastric bypass   Dupuytren's disease of finger with contracture   Seeing hand specialist, discussing possible surgery      Long-term use of immunosuppressant medication   On prednisone  5mg  daily, mycophenolate  180/360mg  bid, tacrolimus  2/1mg  bid, followed by transplant team.       History of Bell's palsy with chronic left facial droop   Chronic kidney disease, stage 3b (HCC)   Appreciate renal transplant team care.       Ulcer of lower extremity, limited to breakdown of skin (HCC)   Appreciate wound clinic care.       Inflammatory polyarthropathy (HCC)   Continue seeing rheumatology.         Meds ordered this encounter  Medications   montelukast  (SINGULAIR ) 10 MG tablet    Sig: Take 1 tablet (10 mg total) by  mouth at bedtime.    Dispense:  30 tablet    Refill:  11    No orders of the defined types were placed in this encounter.   Patient Instructions  Continue current medicines. Good to see you today Return as needed 4 months for follow up visit   Follow up plan: Return in about 4 months (around 06/08/2024) for follow up visit.  [name] Blas, MD

## 2024-02-09 NOTE — Telephone Encounter (Signed)
 SABRA

## 2024-02-09 NOTE — Patient Instructions (Addendum)
 Medication Instructions:  No medication changes today. *If you need a refill on your cardiac medications before your next appointment, please call your pharmacy*  Lab Work: BMET, BNP, and CBC today.  If you have labs (blood work) drawn today and your tests are completely normal, you will receive your results only by: MyChart Message (if you have MyChart) OR A paper copy in the mail If you have any lab test that is abnormal or we need to change your treatment, we will call you to review the results.  Testing/Procedures: You are scheduled for Watchman 02/26/2024  Follow-Up: At [facility], you and your health needs are our priority.  As part of our continuing mission to provide you with exceptional heart care, our providers are all part of one team.  This team includes your primary Cardiologist (physician) and Advanced Practice Providers or APPs (Physician Assistants and Nurse Practitioners) who all work together to provide you with the care you need, when you need it.  Your next appointment:   As scheduled after your procedure.   We recommend signing up for the patient portal called MyChart.  Sign up information is provided on this After Visit Summary.  MyChart is used to connect with patients for Virtual Visits (Telemedicine).  Patients are able to view lab/test results, encounter notes, upcoming appointments, etc.  Non-urgent messages can be sent to your provider as well.   To learn more about what you can do with MyChart, go to [email or web address].

## 2024-02-10 ENCOUNTER — Ambulatory Visit: Payer: Self-pay | Admitting: Student

## 2024-02-10 ENCOUNTER — Telehealth: Payer: Self-pay

## 2024-02-10 ENCOUNTER — Encounter: Admitting: Physician Assistant

## 2024-02-10 DIAGNOSIS — M25512 Pain in left shoulder: Secondary | ICD-10-CM | POA: Diagnosis not present

## 2024-02-10 DIAGNOSIS — Z7901 Long term (current) use of anticoagulants: Secondary | ICD-10-CM | POA: Diagnosis not present

## 2024-02-10 DIAGNOSIS — I480 Paroxysmal atrial fibrillation: Secondary | ICD-10-CM | POA: Diagnosis not present

## 2024-02-10 DIAGNOSIS — M25612 Stiffness of left shoulder, not elsewhere classified: Secondary | ICD-10-CM | POA: Diagnosis not present

## 2024-02-10 DIAGNOSIS — I5042 Chronic combined systolic (congestive) and diastolic (congestive) heart failure: Secondary | ICD-10-CM | POA: Diagnosis not present

## 2024-02-10 DIAGNOSIS — I87333 Chronic venous hypertension (idiopathic) with ulcer and inflammation of bilateral lower extremity: Secondary | ICD-10-CM | POA: Diagnosis not present

## 2024-02-10 DIAGNOSIS — L97812 Non-pressure chronic ulcer of other part of right lower leg with fat layer exposed: Secondary | ICD-10-CM | POA: Diagnosis not present

## 2024-02-10 DIAGNOSIS — Z940 Kidney transplant status: Secondary | ICD-10-CM | POA: Diagnosis not present

## 2024-02-10 DIAGNOSIS — I110 Hypertensive heart disease with heart failure: Secondary | ICD-10-CM | POA: Diagnosis not present

## 2024-02-10 DIAGNOSIS — I890 Lymphedema, not elsewhere classified: Secondary | ICD-10-CM | POA: Diagnosis not present

## 2024-02-10 DIAGNOSIS — E11622 Type 2 diabetes mellitus with other skin ulcer: Secondary | ICD-10-CM | POA: Diagnosis not present

## 2024-02-10 DIAGNOSIS — M542 Cervicalgia: Secondary | ICD-10-CM | POA: Diagnosis not present

## 2024-02-10 LAB — BASIC METABOLIC PANEL WITH GFR
BUN/Creatinine Ratio: 29 — ABNORMAL HIGH (ref 10–24)
BUN: 78 mg/dL (ref 8–27)
CO2: 24 mmol/L (ref 20–29)
Calcium: 9.1 mg/dL (ref 8.6–10.2)
Chloride: 102 mmol/L (ref 96–106)
Creatinine, Ser: 2.7 mg/dL — ABNORMAL HIGH (ref 0.76–1.27)
Glucose: 151 mg/dL — ABNORMAL HIGH (ref 70–99)
Potassium: 3.5 mmol/L (ref 3.5–5.2)
Sodium: 142 mmol/L (ref 134–144)
eGFR: 26 mL/min/1.73 — ABNORMAL LOW (ref >59)

## 2024-02-10 LAB — CBC
Hematocrit: 33.3 % — AB (ref 37.5–51.0)
Hemoglobin: 10.4 g/dL — AB (ref 13.0–17.7)
MCH: 29.2 pg (ref 26.6–33.0)
MCHC: 31.2 g/dL — AB (ref 31.5–35.7)
MCV: 94 fL (ref 79–97)
Platelets: 119 x10E3/uL — AB (ref 150–450)
RBC: 3.56 x10E6/uL — AB (ref 4.14–5.80)
RDW: 15.5 % — AB (ref 11.6–15.4)
WBC: 3.3 x10E3/uL — AB (ref 3.4–10.8)

## 2024-02-10 LAB — BRAIN NATRIURETIC PEPTIDE: BNP: 538.3 pg/mL — ABNORMAL HIGH (ref 0.0–100.0)

## 2024-02-10 NOTE — Assessment & Plan Note (Signed)
 Appreciate renal transplant team care.

## 2024-02-10 NOTE — Assessment & Plan Note (Addendum)
 Continue eliquis   Regularly sees EP

## 2024-02-10 NOTE — Assessment & Plan Note (Signed)
 This is regularly followed by hematology, with latest Venofer  200mg  infusions x3 12/2023.

## 2024-02-10 NOTE — Assessment & Plan Note (Signed)
 Continue nightly CPAP followed by Dr Jess pulmonologist

## 2024-02-10 NOTE — Telephone Encounter (Signed)
 Left voicemail for patient to return call to discuss upcoming Watchman.

## 2024-02-10 NOTE — Telephone Encounter (Signed)
 He just had a CBC checked yesterday which looks stable.  He has labs scheduled for next week.  We can move it up to this week if he would like or keep it the way it is

## 2024-02-10 NOTE — Telephone Encounter (Signed)
 Patient is thinking his iron  may be low and request to have labs checked.  Having increased fatigue, sleeping a lot, feeling cold, and occasional dizziness with movement for the past week.  Had CBC drawn yesterday with 10.4 result.  Denies headaches or bleeding.

## 2024-02-10 NOTE — Assessment & Plan Note (Signed)
Continue seeing rheumatology

## 2024-02-10 NOTE — Assessment & Plan Note (Signed)
 Continue rosuvastatin , eliquis .

## 2024-02-10 NOTE — Assessment & Plan Note (Signed)
 On topical testosterone  replacement through urology.

## 2024-02-10 NOTE — Assessment & Plan Note (Deleted)
 Continue torsemide  with PRN metolazone .  Regularly sees cardiology clinic.

## 2024-02-10 NOTE — Assessment & Plan Note (Signed)
 Seeing hand specialist, discussing possible surgery

## 2024-02-10 NOTE — Assessment & Plan Note (Signed)
 Continue torsemide  with PRN metolazone .  Regularly sees cardiology clinic.

## 2024-02-10 NOTE — Assessment & Plan Note (Signed)
Appreciate wound clinic care.  

## 2024-02-10 NOTE — Telephone Encounter (Signed)
Per Dr. Cindie, informed the patient that due to his lab work (pancytopenia, increasingly worsening kidney function), it is unsafe to proceed with Watchman on 02/26/2024. Spoke with the patient at length. Scheduled the patient with Dr. Almetta on 04/11/2024 to reestablish EP care and to revisit Watchman if labs have improved.  The patient was grateful for call and agreed with plan. Watchman procedure cancelled.

## 2024-02-10 NOTE — Assessment & Plan Note (Signed)
 On prednisone  5mg  daily, mycophenolate  180/360mg  bid, tacrolimus  2/1mg  bid, followed by transplant team.

## 2024-02-10 NOTE — Assessment & Plan Note (Signed)
 Sees endo, recent good glycemic control with mounjaro  titration and continued farxiga , notes rare semglee  insulin  use.

## 2024-02-10 NOTE — Telephone Encounter (Signed)
Patient notified of appt change.

## 2024-02-10 NOTE — Assessment & Plan Note (Signed)
 Recent ER visit for chest pain deemed GI in nature, treated with maalox and omeprazole .

## 2024-02-10 NOTE — Assessment & Plan Note (Signed)
 Gout with tophi, followed by rheum Mathias) on Uloric  with PRN colchicine . To consider Pegloticase IV treatment.

## 2024-02-10 NOTE — Telephone Encounter (Signed)
 Please move lab appt up to this week and inform pt of appt details.  Thanks!

## 2024-02-11 ENCOUNTER — Other Ambulatory Visit: Payer: Self-pay

## 2024-02-12 ENCOUNTER — Ambulatory Visit: Payer: Self-pay | Admitting: Oncology

## 2024-02-12 ENCOUNTER — Inpatient Hospital Stay: Attending: Oncology

## 2024-02-12 DIAGNOSIS — M25612 Stiffness of left shoulder, not elsewhere classified: Secondary | ICD-10-CM | POA: Diagnosis not present

## 2024-02-12 DIAGNOSIS — M542 Cervicalgia: Secondary | ICD-10-CM | POA: Diagnosis not present

## 2024-02-12 DIAGNOSIS — D508 Other iron deficiency anemias: Secondary | ICD-10-CM

## 2024-02-12 DIAGNOSIS — D631 Anemia in chronic kidney disease: Secondary | ICD-10-CM | POA: Diagnosis not present

## 2024-02-12 DIAGNOSIS — M25512 Pain in left shoulder: Secondary | ICD-10-CM | POA: Diagnosis not present

## 2024-02-12 DIAGNOSIS — N184 Chronic kidney disease, stage 4 (severe): Secondary | ICD-10-CM | POA: Diagnosis not present

## 2024-02-12 LAB — CBC WITH DIFFERENTIAL (CANCER CENTER ONLY)
Abs Immature Granulocytes: 0.02 K/uL (ref 0.00–0.07)
Basophils Absolute: 0 K/uL (ref 0.0–0.1)
Basophils Relative: 0 %
Eosinophils Absolute: 0 K/uL (ref 0.0–0.5)
Eosinophils Relative: 0 %
HCT: 33.1 % — ABNORMAL LOW (ref 39.0–52.0)
Hemoglobin: 10.6 g/dL — ABNORMAL LOW (ref 13.0–17.0)
Immature Granulocytes: 1 %
Lymphocytes Relative: 17 %
Lymphs Abs: 0.5 K/uL — ABNORMAL LOW (ref 0.7–4.0)
MCH: 29.5 pg (ref 26.0–34.0)
MCHC: 32 g/dL (ref 30.0–36.0)
MCV: 92.2 fL (ref 80.0–100.0)
Monocytes Absolute: 0.1 K/uL (ref 0.1–1.0)
Monocytes Relative: 2 %
Neutro Abs: 2.3 K/uL (ref 1.7–7.7)
Neutrophils Relative %: 80 %
Platelet Count: 112 K/uL — ABNORMAL LOW (ref 150–400)
RBC: 3.59 MIL/uL — ABNORMAL LOW (ref 4.22–5.81)
RDW: 17.1 % — ABNORMAL HIGH (ref 11.5–15.5)
WBC Count: 2.9 K/uL — ABNORMAL LOW (ref 4.0–10.5)
nRBC: 0 % (ref 0.0–0.2)

## 2024-02-12 LAB — IRON AND TIBC
Iron: 49 ug/dL (ref 45–182)
Saturation Ratios: 18 % (ref 17.9–39.5)
TIBC: 267 ug/dL (ref 250–450)
UIBC: 218 ug/dL

## 2024-02-12 LAB — FERRITIN: Ferritin: 312 ng/mL (ref 24–336)

## 2024-02-16 ENCOUNTER — Telehealth: Payer: Self-pay | Admitting: Cardiology

## 2024-02-16 DIAGNOSIS — M25612 Stiffness of left shoulder, not elsewhere classified: Secondary | ICD-10-CM | POA: Diagnosis not present

## 2024-02-16 DIAGNOSIS — M542 Cervicalgia: Secondary | ICD-10-CM | POA: Diagnosis not present

## 2024-02-16 DIAGNOSIS — M25512 Pain in left shoulder: Secondary | ICD-10-CM | POA: Diagnosis not present

## 2024-02-16 NOTE — Telephone Encounter (Signed)
 Called to confirm/remind patient of their appointment at the [facility] on 02/17/24.   Appointment:   [] Confirmed  [x] Left mess   [] No answer/No voice mail  [] VM Full/unable to leave message  [] Phone not in service  Patient reminded to bring all medications and/or complete list.  Confirmed patient has transportation. Gave directions, instructed to utilize valet parking.

## 2024-02-17 ENCOUNTER — Ambulatory Visit: Attending: Cardiology | Admitting: Cardiology

## 2024-02-17 ENCOUNTER — Encounter: Payer: Self-pay | Admitting: Cardiology

## 2024-02-17 ENCOUNTER — Ambulatory Visit: Admitting: Cardiology

## 2024-02-17 ENCOUNTER — Other Ambulatory Visit

## 2024-02-17 VITALS — BP 130/65 | HR 64 | Wt 238.5 lb

## 2024-02-17 DIAGNOSIS — Z7984 Long term (current) use of oral hypoglycemic drugs: Secondary | ICD-10-CM | POA: Insufficient documentation

## 2024-02-17 DIAGNOSIS — Z7985 Long-term (current) use of injectable non-insulin antidiabetic drugs: Secondary | ICD-10-CM | POA: Diagnosis not present

## 2024-02-17 DIAGNOSIS — Z79624 Long term (current) use of inhibitors of nucleotide synthesis: Secondary | ICD-10-CM | POA: Insufficient documentation

## 2024-02-17 DIAGNOSIS — Z9181 History of falling: Secondary | ICD-10-CM | POA: Diagnosis not present

## 2024-02-17 DIAGNOSIS — I5023 Acute on chronic systolic (congestive) heart failure: Secondary | ICD-10-CM | POA: Insufficient documentation

## 2024-02-17 DIAGNOSIS — I272 Pulmonary hypertension, unspecified: Secondary | ICD-10-CM | POA: Insufficient documentation

## 2024-02-17 DIAGNOSIS — D631 Anemia in chronic kidney disease: Secondary | ICD-10-CM | POA: Diagnosis not present

## 2024-02-17 DIAGNOSIS — I130 Hypertensive heart and chronic kidney disease with heart failure and stage 1 through stage 4 chronic kidney disease, or unspecified chronic kidney disease: Secondary | ICD-10-CM | POA: Diagnosis not present

## 2024-02-17 DIAGNOSIS — I482 Chronic atrial fibrillation, unspecified: Secondary | ICD-10-CM | POA: Insufficient documentation

## 2024-02-17 DIAGNOSIS — I255 Ischemic cardiomyopathy: Secondary | ICD-10-CM | POA: Insufficient documentation

## 2024-02-17 DIAGNOSIS — Z951 Presence of aortocoronary bypass graft: Secondary | ICD-10-CM | POA: Insufficient documentation

## 2024-02-17 DIAGNOSIS — E1122 Type 2 diabetes mellitus with diabetic chronic kidney disease: Secondary | ICD-10-CM | POA: Insufficient documentation

## 2024-02-17 DIAGNOSIS — Z79899 Other long term (current) drug therapy: Secondary | ICD-10-CM | POA: Insufficient documentation

## 2024-02-17 DIAGNOSIS — N184 Chronic kidney disease, stage 4 (severe): Secondary | ICD-10-CM | POA: Diagnosis not present

## 2024-02-17 DIAGNOSIS — I2510 Atherosclerotic heart disease of native coronary artery without angina pectoris: Secondary | ICD-10-CM | POA: Insufficient documentation

## 2024-02-17 DIAGNOSIS — I5022 Chronic systolic (congestive) heart failure: Secondary | ICD-10-CM

## 2024-02-17 DIAGNOSIS — I252 Old myocardial infarction: Secondary | ICD-10-CM | POA: Diagnosis not present

## 2024-02-17 DIAGNOSIS — Z7901 Long term (current) use of anticoagulants: Secondary | ICD-10-CM | POA: Diagnosis not present

## 2024-02-17 DIAGNOSIS — D472 Monoclonal gammopathy: Secondary | ICD-10-CM | POA: Diagnosis not present

## 2024-02-17 DIAGNOSIS — Z940 Kidney transplant status: Secondary | ICD-10-CM | POA: Diagnosis not present

## 2024-02-17 DIAGNOSIS — G4733 Obstructive sleep apnea (adult) (pediatric): Secondary | ICD-10-CM | POA: Insufficient documentation

## 2024-02-17 DIAGNOSIS — R001 Bradycardia, unspecified: Secondary | ICD-10-CM | POA: Insufficient documentation

## 2024-02-17 NOTE — Patient Instructions (Signed)
 Medication Changes:  No medication changes today!  Lab Work:  Go downstairs to [facility] on [address] to have your blood work completed.  We will only call you if the results are abnormal or if the provider would like to make medication changes.  No news is good news.   Follow-Up in: Please follow up with the [facility] in 3 months with Carl Class, FNP.   Thank you for choosing [facility].    At the [facility], you and your health needs are our priority. We have a designated team specialized in the treatment of Heart Failure. This Care Team includes your primary Heart Failure Specialized Cardiologist (physician), Advanced Practice Providers (APPs- Physician Assistants and Nurse Practitioners), and Pharmacist who all work together to provide you with the care you need, when you need it.   You may see any of the following providers on your designated Care Team at your next follow up:  Dr. Toribio Fuel Dr. Ezra Shuck Dr. Ria Commander Dr. Morene Brownie Carl Class, FNP Jaun Bash, RPH-CPP  Please be sure to bring in all your medications bottles to every appointment.   Need to Contact Us :  If you have any questions or concerns before your next appointment please send us  a message through [place] or call our office at [phone].    TO LEAVE A MESSAGE FOR THE NURSE SELECT OPTION 2, PLEASE LEAVE A MESSAGE INCLUDING: YOUR NAME DATE OF BIRTH CALL BACK NUMBER REASON FOR CALL**this is important as we prioritize the call backs  YOU WILL RECEIVE A CALL BACK THE SAME DAY AS LONG AS YOU CALL BEFORE 4:00 PM

## 2024-02-17 NOTE — Progress Notes (Signed)
 Advanced Heart Failure Clinic Note   Date:  02/17/2024   ID:  [name]., DOB [birth year], MRN [id number]   Provider location: [address], [place] Type of Visit:  Established patient  PCP:  [name], MD  Nephrology: Dr. Joshua  HF Cardiologist: Dr. Rolan  Reason for visit: CHF  HPI: [name]. is a 63 y.o. male  with a history of CAD s/p CABG 2005, chronic atrial fibrillation on Eliquis , ESRD s/p renal transplant 2012, and chronic diastolic CHF.  He has a history of ischemic cardiomyopathy with EF as low as 35-40% in the past but echo in 3/20 showed EF 60-65%.  There is evidence for RV failure with pulmonary hypertension by echo.    Admitted 11/05/18 low grade fever and shortness of breath. HS Trop negative. Covid 19 negative. Blood CX negative. Placed on antibiotics and discharged to home the next day.   Admitted 05/03/19 with A/C Diastolic HF. Diuresed with IV lasix  and transitioned to torsemide  80/40 mg . Discharge weight 252 pounds.   Admitted 06/2019 with left arm pain. Left upper extremity venous Doppler revealed occluded cephalic vein at the AV fistula outflow tract in the antecubital fossa with no other thrombus identified. Vascular evaluated. He continued on Eliquis .  No plan for intervention. Also treated for acute gout flare. Discharged on prednisone  taper.     Given IV lasix  on 12/21 by HF Paramedicine.   Admitted to [facility] 2/22 with acute gout flare vs cellulitis. Nephrology was consulted to evaluate transplant regimen and follow along. He was treated with antibiotics and prednisone . Discharged on decreased torsemide  dose of 40 mg daily. Weight 259 lbs.  Admitted 4/22 for left leg cellulitis after sustaining a dog scratch, received IV antibiotics. No changes to his diuretic therapy. Discharge weight 266 lbs.  Follow up with Dr. Darron, 1/23, he had CP and underwent Lexiscan  myoview , which overall was a low risk study with  evidence of prior infarct in the infero-apical area with no significant change from before. Losartan  added to GDMT regimen.  Follow up 5/23, volume overloaded, REDs 40%. Advised taking additional metolazone  (3 doses that week), however labs showed worsening renal function and advised to keep at 2 doses.  Admitted in 5/23 with acute on chronic CHF and AKI.  Spiro, Farxiga  and losartan  stopped. Diuresed with IV lasix . Echo showed EF 40-45%, RV mildly reduced, GIIIDD (restrictive), IVC dilated. Nephrology consulted for diuretic dosing. Discharged home, weight 266 lbs.  He had chest pain in 8/23 and was admitted at [facility], minimal HS-TnI elevation with no trend, thought to be demand ischemia from volume overload.    Echo 8/23 showing EF 40-45%, diffuse hypokinesis with dyssynchrony, mildly dilated RV with mildly decreased systolic function, IVC dilated, moderate AS with mean gradient 15, mild-moderate mitral stenosis with mean gradient 6.   Follow up 03/27/22, volume overloaded with 25 lbs weight gain. Instructed to use Furoscix  x 3 days, then resume home dose of torsemide  + metolazone . Clinic weight 277 lbs.  Admitted from [facility] 04/01/22 with a/c CHF. Echo showed EF 40-45%, RV moderately reduced and RVSP 49 mmHg. RHC showed elevated filling pressures, moderate primarily pulmonary venous hypertension and preserved CO. Tadalafil  stopped with primarily PH group 2 and 3. He was diuresed with IV lasix  and metolazone , and GDMT added back. Overall down 19 lbs. He was discharged home, weight 255 lbs.  Admitted in 3/24 with Serratia bacteremia/pyelonephritis, treated with ertapenem .  He also had campylobacter  diarrhea.  In 4/24, he tripped getting off his riding lawnmower and fell, fracturing ribs. He had a pulmonary contusion with this.   Admitted 11/24 with slurred speech and profuse diarrhea.  MRI brain showed no acute stroke.  He had AKI on CKD stage IV with creatinine up to 4.3, presentation thought to be due to  diarrhea with dehydration related to use of Mounjaro .  He was given IV fluid and diuretics were stopped.  Losartan  and spironolactone  were stopped. He was sent home without diuretics.   Echo in 12/24 was stable with EF 45-50%, low normal RV function, mild MR.  Zio monitor for bradycardia in 12/24 showed average HR 52, no pauses, 4.7% PVCs.   Patient fell while on a cruise in 1/25 and broke ribs on the right (mechanical fall).  He was later admitted in 1/25 with influenza A and suspected development of secondary bacterial PNA.  6/25 had another mechanical fall and fractured his shoulder.   Admitted 7/25 with another mechanical fall & subsequent severe persistent epistaxis. Eliquis  held. He required nasal packing by ENT.  Initial head CT demonstrated small volume hemorrhage layering in the maxillary sinuses, and fluid in the visible pharynx. He had an acute change in mental status, with right-sided weakness. Code stroke called. Repeat head CT negative. MRI of the brain showed mild nonspecific sinus disease with mild air-fluid levels which can be seen in acute sinusitis, with no evidence of stroke. EEG without evidence of seizure  or epileptiform discharges. Symptoms resolved. Echo in 7/25 showed EF 40 to 45%, RV systolic function not well visualized, MV and AoV poorly visualized. He was diuresed and GDMT titrated, but initially limited by elevated creatinine. Nasal packing removed and he was instructed to remain off of Eliquis  until f/u. At hospital follow up in the [facility], referral was placed to EP for consideration for Watchman. With rise in creatinine and mild thrombocytopenia, it was decided not to proceed with Watchman.    He returns for followup of CHF.  He is no longer taking hydralazine  and at some point, he was switched from losartan  to lisinopril . No recent falls, no further epistaxis.  On Eliquis  with no bleeding issues.  He takes metolazone  occasionally, usually with dietary indiscretion.  He at at  [employer] last week and took a metolazone  the next day.  No recent chest pain.  He is short of breath walking up stairs and hills.  No dyspnea on flat ground.  No orthopnea/PND.  No lightheadedness. Weight down 4 lbs.   ECG (11/25, personally reviewed): Atrial fibrillation, LPFB, RBBB, PVCs  Labs (1/24): K 4.6, creatinine 2.44 Labs (2/24): K 3.4, creatinine 2.75, LDL 25 Labs (4/24): K 4.1, creatinine 2.46 Labs (5/24): K 2.7, creatinine 2.25, IgG monoclonal protein on myeloma panel.  Labs (6/24): K 3.8, creatinine 2.58 Labs (9/24): BNP 265, K 4.1, creatinine 2.83, LFTs normal, hgb 13.6 Labs (11/24): K 3.3 => 3.7, creatinine 4.3 => 1.88 => 2.08 Labs (1/25): LDL 17, K 4.9, creatinine 3.3 Labs (5/25): BNP 241, K 3.8, creatinine 2.59, LDL 18 Labs (7/25): K 4.2, creatinine 2.96 Labs (11/25): K 3.6, creatinine 2.02 => 2.7, plts 112, hgb 10.6  PMH: 1. CAD: s/p CABG x 4 in [place]  in 2005.  - Cardiolite (1/20): EF 47%, small fixed apical septal defect, no ischemia.  Low risk study.  - Cardiolite (12/22): small apical inferior defect, no changes from prior, no ischemia. Low risk study. 2. Asthma 3. Gout 4. Atrial fibrillation: Chronic.  5. ESRD s/p  renal transplantation at [facility] in 2012.  6. Anemia of chronic disease 7. Type II diabetes 8. HTN 9. Hyperlipidemia 10. OSA: On bipap.  11. Ischemic cardiomyopathy: EF 35-40% in past.  - Cardiolite (1/20) with EF 47%.  - Echo (1/20): EF 50-55%, mild MR, RV moderately dilated with moderately decreased systolic function, PASP 73 mmHg, moderate TR.   - Echo (3/20): EF 60-65%, PASP 75 mmHg, mildly dilated RV with mildly decreased systolic function.  - Echo (2/21): EF 45-50%, global HK, moderately dilated RV with moderately decreased systolic function, D-shaped interventricular septum, dilated IVC.  - RHC (2/21): mean RA 19, PA 78/31 mean 51, mean PCWP 26, CI 3.26, PVR 3.25 - Echo (5/23): EF 40-45%, RV mildly reduced, GIIIDD (restrictive) - Echo  (8/23): EF 40-45%, diffuse hypokinesis with dyssynchrony, mildly dilated RV with mildly decreased systolic function, IVC dilated, moderate AS with mean gradient 15, mild-moderate mitral stenosis with mean gradient 6.  - Echo (1/24): Echo showed EF 40-45%, RV moderately reduced and RVSP 49 mmHg, mild MR, no AS, dilated IVC.  - RHC (1/24): RA mean 13, PA 62/24 (mean 40), PCWP mean 20, CO/CI (Fick) 7.63/3.26, PVR 2.6 WU, PAPi 2.9 - Echo (12/24): EF 45-50%, low normal RV function, mild MR. - Echo (7/25): EF 40-45%, RV not well visualized, MV and AoV poorly visualized.  12. Obesity: s/p gastric bypass in 2015.  13. Bradycardia - Zio monitor (12/24): average HR 52, no pauses, 4.7% PVCs 14. Aortic stenosis: Moderate on 8/23 echo.  15. Mitral stenosis: Mild to moderate on 8/23 echo.  16. Rheumatoid arthritis 17. IgG monoclonal gammopathy 18. PAD: ABIs (10/24) with noncompressible ABIs, normal TBIs.  19. H/o Bell's palsy  Past Surgical History:  Procedure Laterality Date   CARDIAC CATHETERIZATION N/A 08/10/2002   CATARACT EXTRACTION W/PHACO Right 10/29/2017   Procedure: CATARACT EXTRACTION PHACO AND INTRAOCULAR LENS PLACEMENT (IOC)  RIGHT DIABETIC;  Surgeon: Mittie Gaskin, MD;  Location: [facility];  Service: Ophthalmology;  Laterality: Right   CATARACT EXTRACTION W/PHACO Left 11/18/2017   Procedure: CATARACT EXTRACTION PHACO AND INTRAOCULAR LENS PLACEMENT (IOC) LEFT IVA/TOPICAL;  Surgeon: Mittie Gaskin, MD;  Location: [facility];  Service: Ophthalmology;  Laterality: Left   COLONOSCOPY WITH PROPOFOL  N/A 04/22/2013   Procedure: COLONOSCOPY WITH PROPOFOL ;  Surgeon: Toribio SHAUNNA Cedar, MD;  Location: [facility];  Service: Endoscopy;  Laterality: N/A;   CORONARY ANGIOPLASTY WITH STENT PLACEMENT N/A 1996   CORONARY ARTERY BYPASS GRAFT N/A 08/13/2002   Procedure: 4v CORONARY ARTERY BYPASS GRAFTING; Location: [facility]; Surgeon: Dessa Laine, MD   CYSTOSCOPY WITH  INSERTION OF UROLIFT N/A 12/25/2021   Procedure: CYSTOSCOPY WITH INSERTION OF UROLIFT;  Surgeon: Twylla Glendia BROCKS, MD;  Location: [facility];  Service: Urology;  Laterality: N/A;   DG ANGIO AV SHUNT*L*     right and left upper arms   FASCIOTOMY  03/03/2012   Procedure: FASCIOTOMY;  Surgeon: Arley JONELLE Curia, MD;  Location: [facility];  Service: Orthopedics;  Laterality: Right;  FASCIOTOMY RIGHT SMALL FINGER   FASCIOTOMY Left 08/17/2013   Procedure: FASCIOTOMY LEFT RING;  Surgeon: Arley JONELLE Curia, MD;  Location: [facility];  Service: Orthopedics;  Laterality: Left;   INCISION AND DRAINAGE ABSCESS Left 10/15/2015   Procedure: INCISION AND DRAINAGE ABSCESS;  Surgeon: Laneta JULIANNA Luna, MD;  Location: [facility];  Service: General;  Laterality: Left;   KIDNEY TRANSPLANT  09/13/2010   cadaver--at [facility]   LASER ABLATION CONDOLAMATA N/A 01/08/2023   Procedure: LASER  REMOVAL ABLATION OF CONDYLOMATA;  Surgeon: Sheldon Standing, MD;  Location: [facility];  Service: General;  Laterality: N/A;  GEN AND LOCAL   LASER ABLATION CONDOLAMATA N/A 11/20/2023   Procedure: ABLATION, CONDYLOMA, USING LASER HEMORRHOIDAL LIGATION & HEMORRHOIDOPEXY, HEMORRHOIDECTOMY x 2;  Surgeon: Sheldon Standing, MD;  Location: [facility];  Service: General;  Laterality: N/A;  LASER REMOVAL ABLATION OF CONDYLOMATA ANORECTAL EXAMINATION UNDER ANESTHESIA   RECTAL EXAM UNDER ANESTHESIA N/A 01/08/2023   Procedure: ANORECTAL EXAM UNDER ANESTHESIA;  Surgeon: Sheldon Standing, MD;  Location: [facility];  Service: General;  Laterality: N/A;   RECTAL EXAM UNDER ANESTHESIA N/A 11/20/2023   Procedure: EXAM UNDER ANESTHESIA, RECTUM;  Surgeon: Sheldon Standing, MD;  Location: [facility];  Service: General;  Laterality: N/A;   RIGHT HEART CATH N/A 11/15/2016   Procedure: RIGHT HEART CATH;  Surgeon: Darron Deatrice LABOR, MD;  Location: [facility];  Service: Cardiovascular;  Laterality: N/A;   RIGHT HEART CATH N/A 05/03/2019   Procedure: RIGHT HEART  CATH;  Surgeon: [name], MD;  Location: [facility] IN[facility] CV [facility];  Service: Cardiovascular;  Laterality: N/A;   RIGHT HEART CATH N/A 04/04/2022   Procedure: RIGHT HEART CATH;  Surgeon: [name], MD;  Location: [facility] INVAS[facility] CV LAB;  Service: Cardiovascular;  Laterality: N/A;   ROUX-EN-Y GASTRIC BYPASS N/A 2015   TYMPANIC MEMBRANE REPAIR Left 03/2010   VASECTOMY     WART FULGURATION N/A 05/31/2022   Procedure: FULGURATION ANAL WART;  Surgeon: Tye Millet, DO;  Location: [facility];  Service: General;  Laterality: N/A;   Current Outpatient Medications  Medication Sig Dispense Refill   albuterol  (PROVENTIL ) (2.5 MG/3ML) 0.083% nebulizer solution Take 3 mLs (2.5 mg total) by nebulization every 6 (six) hours as needed for wheezing or shortness of breath. 150 mL 0   albuterol  (VENTOLIN  HFA) 108 (90 Base) MCG/ACT inhaler Inhale 2 puffs into the lungs every 6 (six) hours as needed for wheezing or shortness of breath. 6.7 g 5   aluminum -magnesium  hydroxide-simethicone  (MAALOX) 200-200-20 MG/5ML SUSP Take 30 mLs by mouth 4 (four) times daily -  before meals and at bedtime. 1680 mL 0   apixaban  (ELIQUIS ) 5 MG TABS tablet Take 1 tablet (5 mg total) by mouth 2 (two) times daily.     buPROPion  (WELLBUTRIN  SR) 150 MG 12 hr tablet Take 1 tablet (150 mg total) by mouth 2 (two) times daily. 180 tablet 3   colchicine  0.6 MG tablet Take 1 tablet (0.6 mg total) by mouth daily. 90 tablet 1   Continuous Glucose Sensor (FREESTYLE LIBRE 3 PLUS SENSOR) MISC Use 1 each every 15 (fifteen) days 6 each 3   Cyanocobalamin  (B-12 PO) Take 1 tablet by mouth daily.     dapagliflozin  propanediol (FARXIGA ) 10 MG TABS tablet Take 1 tablet (10 mg total) by mouth daily before breakfast. 90 tablet 3   febuxostat  (ULORIC ) 40 MG tablet Take 3 tablets (120 mg total) by mouth daily. 90 tablet 5   fluticasone  (FLONASE ) 50 MCG/ACT nasal spray Place 2 sprays into both nostrils daily. 16 g 11   gabapentin  (NEURONTIN ) 300 MG capsule Take  1 capsule (300 mg total) by mouth 2 (two) times daily. 180 capsule 3   Insulin  Pen Needle (TECHLITE PEN NEEDLES) 31G X 5 MM MISC 4 (four) times daily. 400 each 3   Insulin  Pen Needle (UNIFINE PENTIPS) 31G X 5 MM MISC Use 4 times daily as directed 400 each 3   lisinopril  (ZESTRIL ) 40 MG tablet Take  40 mg by mouth.     metolazone  (ZAROXOLYN ) 2.5 MG tablet Take 1 tablet (2.5 mg total) by mouth as directed by the heart failure clinic. 30 tablet 1   montelukast  (SINGULAIR ) 10 MG tablet Take 1 tablet (10 mg total) by mouth at bedtime. 30 tablet 11   mycophenolate  (MYFORTIC ) 180 MG EC tablet Take 2 tablets (360 mg total) by mouth every morning AND 1 tablet (180 mg total) every evening. 360 tablet 3   omeprazole  (PRILOSEC) 20 MG capsule Take 1 capsule (20 mg total) by mouth daily. 30 capsule 11   predniSONE  (DELTASONE ) 5 MG tablet Take 1 tablet (5 mg total) by mouth daily. 90 tablet 3   rosuvastatin  (CRESTOR ) 10 MG tablet Take 1 tablet (10 mg total) by mouth daily. 90 tablet 3   sodium chloride  (OCEAN) 0.65 % SOLN nasal spray Place 2 sprays into both nostrils every 8 (eight) hours for 10 days.     tacrolimus  (PROGRAF ) 1 MG capsule Take 1 mg by mouth.     tadalafil  (CIALIS ) 20 MG tablet Take 0.5-1 tablets (10-20 mg total) by mouth every other day as needed for ere 10 tablet 11   tamsulosin  (FLOMAX ) 0.4 MG CAPS capsule Take 1 capsule (0.4 mg total) by mouth daily. 30 capsule 11   Testosterone  Enanthate (XYOSTED ) 75 MG/0.5ML SOAJ Inject 0.5 mLs into the skin once a week. 2 mL 0   tirzepatide  (MOUNJARO ) 7.5 MG/0.5ML Pen Inject 7.5 mg into the skin once a week. 6 mL 1   torsemide  (DEMADEX ) 100 MG tablet Take 1 tablet (100 mg total) by mouth 2 (two) times daily. 90 tablet 1   traMADol  (ULTRAM ) 50 MG tablet Take 1 tablet (50 mg total) by mouth every 6 (six) hours as needed for pain. 40 tablet 0   triamcinolone  (KENALOG ) 0.025 % cream Apply 1-2 Applications topically daily to sandpaper areas on legs. 454 g 5    Zinc  50 MG TABS Take 1 tablet (50 mg total) by mouth daily. 100 tablet 3   amoxicillin  (AMOXIL ) 500 MG capsule Take 4 capsules (2,000) 30-60 min prior to dental appointment. (Patient not taking: Reported on 02/17/2024) 4 capsule 5   insulin  glargine-yfgn (SEMGLEE ) 100 UNIT/ML Pen Inject 24 Units subcutaneously once daily. (Patient not taking: Reported on 02/17/2024) 45 mL 3   oxyCODONE -acetaminophen  (PERCOCET) 10-325 MG tablet Take 1 tablet by mouth every 6 (six) hours as needed for up to 5 days. (Patient not taking: Reported on 02/17/2024) 25 tablet 0   No current facility-administered medications for this visit.   Allergies:   Iodinated contrast media, Iodine, Nsaids, Other, and Ibuprofen   Social History:  The patient  reports that he has never smoked. He has never used smokeless tobacco. He reports that he does not currently use alcohol. He reports that he does not use drugs.   Family History:  The patient's family history includes Breast cancer in his maternal grandmother; Diabetes in his maternal grandmother; Heart disease in his father; Kidney disease in his father; Kidney failure in his father; Liver cancer in his paternal grandmother and paternal uncle; Valvular heart disease in his mother.   ROS:  Please see the history of present illness.   All other systems are personally reviewed and negative.   Wt Readings from Last 3 Encounters:  02/17/24 238 lb 8 oz (108.2 kg)  02/09/24 245 lb 8 oz (111.4 kg)  02/09/24 248 lb 4.8 oz (112.6 kg)    BP 130/65   Pulse 64  Wt 238 lb 8 oz (108.2 kg)   SpO2 95%   BMI 34.22 kg/m   Physical Exam  General: NAD Neck: No JVD, no thyromegaly or thyroid  nodule.  Lungs: Clear to auscultation bilaterally with normal respiratory effort. CV: Nondisplaced PMI.  Heart irregular S1/S2, no S3/S4, 2/6 SEM RUSB with clear S2.  Trace ankle edema.  No carotid bruit.  Normal pedal pulses.  Abdomen: Soft, nontender, no hepatosplenomegaly, no distention.  Skin:  Intact without lesions or rashes.  Neurologic: Alert and oriented x 3.  Psych: Normal affect. Extremities: No clubbing or cyanosis.  HEENT: Normal.   Assessment & Plan: 1. Acute on Chronic heart failure with Mid Range EF/ With prominent RV failure/pulmonary hypertension:  Ischemic cardiomyopathy.  ?If RV failure is related to severe OSA, he was a remote smoker. Echo in 2/21 with EF 45-50%, moderately dilated RV with moderately decreased systolic function, moderate pulmonary hypertension. Echo in 12/24 showed EF 45-50%, low normal RV function, mild MR. Echo 7/25 EF 40-45%, RV not well visualized, MV and AoV poorly visualized. Management complicated by CKD stage 4.  Stable NYHA Class II-III. He is not volume overloaded on exam and weight is down 4 lbs.  - Continue torsemide  100 mg bid - Using metolazone  prn, instructed no more than once a week.  - Continue Farxiga  10 mg daily - He is now on lisinopril  40 mg daily.  Will continue this, consider changing to Entresto  in future but would like to make sure renal function remains stable.  BMET/BNP today.  - No spironolactone  with CKD stage IV.  2. Atrial fibrillation: Chronic. Rate controlled.  - Continue Eliquis   5 mg bid. No recent falls or bleeding.  Would not pursue Watchman.  - Not on beta blocker with h/o bradycardia.   3. CAD: S/p CABG 2005.  Cardiolite 12/22 with prior MI, no ischemia. High threshold for cath given CKD. He denies ischemic CP symptoms recently.  - No ASA given Eliquis  use.  - Continue Crestor  10 mg daily. Good lipids in 5/25.   4. CKD Stage 4: S/p renal transplantation in 2012. Followed by [facility] center at [facility]. He remains on mycophenolate  and tacrolimus .   - Continue SGLT2i. - BMET today, creatinine up to 2.7 recently.  5. OSA: Reports compliance w/ BiPAP. 6. HTN: BP controlled on current regimen.  7. DM 2: On insulin . Managed by Endocrinology. - Continue dapagliflozin  for now but watch for further GU symptoms. -  He is on tirzepatide . 8. Pulmonary hypertension: Primarily pulmonary venous hypertension by RHC in 1/24 though there may be group 3 PH component from OSA.  9. Chronic Anemia: Gets iron  infusions.  10. IgG monoclonal gammopathy of uncertain significance: followed by hematologist.  11. Bradycardia: Suspect tachy-brady syndrome. HR generally in 50s-60s.  Zio monitor in 12/24 did not show worrisome bradyarrhythmias (avg HR in 50s, no long pauses). He denies any syncope or dizziness associated w/ prior falls (all seem strictly mechanical).  - No nodal blockers.  - May need pacemaker in future.   Followup with APP in 3 months.   I spent 31 minutes reviewing records, interviewing/examining patient, and managing orders.    Ezra Shuck,   02/17/2024

## 2024-02-18 DIAGNOSIS — M25512 Pain in left shoulder: Secondary | ICD-10-CM | POA: Diagnosis not present

## 2024-02-18 DIAGNOSIS — M25612 Stiffness of left shoulder, not elsewhere classified: Secondary | ICD-10-CM | POA: Diagnosis not present

## 2024-02-19 LAB — BASIC METABOLIC PANEL WITH GFR
BUN/Creatinine Ratio: 30 — ABNORMAL HIGH (ref 10–24)
BUN: 81 mg/dL (ref 8–27)
CO2: 25 mmol/L (ref 20–29)
Calcium: 9.2 mg/dL (ref 8.6–10.2)
Chloride: 102 mmol/L (ref 96–106)
Creatinine, Ser: 2.72 mg/dL — ABNORMAL HIGH (ref 0.76–1.27)
Glucose: 155 mg/dL — ABNORMAL HIGH (ref 70–99)
Potassium: 3.8 mmol/L (ref 3.5–5.2)
Sodium: 143 mmol/L (ref 134–144)
eGFR: 25 mL/min/1.73 — ABNORMAL LOW (ref >59)

## 2024-02-19 LAB — BRAIN NATRIURETIC PEPTIDE: BNP: 585.6 pg/mL — ABNORMAL HIGH (ref 0.0–100.0)

## 2024-02-22 ENCOUNTER — Ambulatory Visit (HOSPITAL_COMMUNITY): Payer: Self-pay | Admitting: Cardiology

## 2024-02-23 ENCOUNTER — Other Ambulatory Visit: Payer: Self-pay

## 2024-02-23 ENCOUNTER — Other Ambulatory Visit: Payer: Self-pay | Admitting: Cardiology

## 2024-02-23 MED ORDER — APIXABAN 5 MG PO TABS
5.0000 mg | ORAL_TABLET | Freq: Two times a day (BID) | ORAL | 3 refills | Status: DC
  Filled 2024-02-23: qty 180, 90d supply, fill #0

## 2024-02-23 MED ORDER — TACROLIMUS 1 MG PO CAPS
ORAL_CAPSULE | ORAL | 11 refills | Status: DC
  Filled 2024-02-23: qty 90, 30d supply, fill #0

## 2024-02-23 MED FILL — Torsemide Tab 100 MG: ORAL | 45 days supply | Qty: 90 | Fill #1 | Status: AC

## 2024-02-25 DIAGNOSIS — M720 Palmar fascial fibromatosis [Dupuytren]: Secondary | ICD-10-CM | POA: Diagnosis not present

## 2024-02-25 DIAGNOSIS — M25612 Stiffness of left shoulder, not elsewhere classified: Secondary | ICD-10-CM | POA: Diagnosis not present

## 2024-02-25 DIAGNOSIS — M6281 Muscle weakness (generalized): Secondary | ICD-10-CM | POA: Diagnosis not present

## 2024-02-25 DIAGNOSIS — E1142 Type 2 diabetes mellitus with diabetic polyneuropathy: Secondary | ICD-10-CM | POA: Diagnosis not present

## 2024-02-25 DIAGNOSIS — S42202D Unspecified fracture of upper end of left humerus, subsequent encounter for fracture with routine healing: Secondary | ICD-10-CM | POA: Diagnosis not present

## 2024-02-25 DIAGNOSIS — M25512 Pain in left shoulder: Secondary | ICD-10-CM | POA: Diagnosis not present

## 2024-02-26 ENCOUNTER — Encounter (HOSPITAL_COMMUNITY): Payer: Self-pay

## 2024-02-26 ENCOUNTER — Inpatient Hospital Stay (HOSPITAL_COMMUNITY): Admit: 2024-02-26 | Admitting: Cardiology

## 2024-02-26 SURGERY — LEFT ATRIAL APPENDAGE OCCLUSION
Anesthesia: General

## 2024-03-01 ENCOUNTER — Telehealth: Payer: Self-pay

## 2024-03-01 ENCOUNTER — Other Ambulatory Visit: Payer: Self-pay

## 2024-03-01 ENCOUNTER — Other Ambulatory Visit: Payer: Self-pay | Admitting: Family Medicine

## 2024-03-01 DIAGNOSIS — G4733 Obstructive sleep apnea (adult) (pediatric): Secondary | ICD-10-CM

## 2024-03-01 MED ORDER — SULFAMETHOXAZOLE-TRIMETHOPRIM 400-80 MG PO TABS
1.0000 | ORAL_TABLET | ORAL | 3 refills | Status: DC
  Filled 2024-03-01: qty 36, 84d supply, fill #0

## 2024-03-01 MED FILL — Montelukast Sodium Tab 10 MG (Base Equiv): ORAL | 30 days supply | Qty: 30 | Fill #0 | Status: CN

## 2024-03-01 NOTE — Telephone Encounter (Signed)
Order has been sent to [facility]

## 2024-03-01 NOTE — Telephone Encounter (Signed)
 Copied from CRM [id number]. Topic: Clinical - Medical Advice >> Mar 01, 2024 10:48 AM Essie A wrote: Reason for CRM: Patient's provider is Dr. Jess. He called [facility] where he gets his supplies and they don't have Dr. Reddy's name on file in his chart.  He is in need of a new face mask and a humidity chamber.  Can someone call [facility] and get her name in the system so patient can get his supplies.  Please let patient know this has been done by calling  Him at [phone].

## 2024-03-02 ENCOUNTER — Other Ambulatory Visit: Payer: Self-pay

## 2024-03-04 ENCOUNTER — Other Ambulatory Visit: Payer: Self-pay

## 2024-03-04 MED ORDER — FEBUXOSTAT 40 MG PO TABS
120.0000 mg | ORAL_TABLET | Freq: Every day | ORAL | 1 refills | Status: DC
  Filled 2024-03-04: qty 270, 90d supply, fill #0

## 2024-03-04 MED FILL — Montelukast Sodium Tab 10 MG (Base Equiv): ORAL | 30 days supply | Qty: 30 | Fill #0 | Status: AC

## 2024-03-05 ENCOUNTER — Other Ambulatory Visit: Payer: Self-pay

## 2024-03-05 DIAGNOSIS — T8619 Other complication of kidney transplant: Secondary | ICD-10-CM | POA: Diagnosis not present

## 2024-03-05 DIAGNOSIS — Z4822 Encounter for aftercare following kidney transplant: Secondary | ICD-10-CM | POA: Diagnosis not present

## 2024-03-05 DIAGNOSIS — N184 Chronic kidney disease, stage 4 (severe): Secondary | ICD-10-CM | POA: Diagnosis not present

## 2024-03-05 DIAGNOSIS — D849 Immunodeficiency, unspecified: Secondary | ICD-10-CM | POA: Diagnosis not present

## 2024-03-05 DIAGNOSIS — E559 Vitamin D deficiency, unspecified: Secondary | ICD-10-CM | POA: Diagnosis not present

## 2024-03-05 DIAGNOSIS — I10 Essential (primary) hypertension: Secondary | ICD-10-CM | POA: Diagnosis not present

## 2024-03-05 DIAGNOSIS — Z940 Kidney transplant status: Secondary | ICD-10-CM | POA: Diagnosis not present

## 2024-03-05 DIAGNOSIS — D509 Iron deficiency anemia, unspecified: Secondary | ICD-10-CM | POA: Diagnosis not present

## 2024-03-05 DIAGNOSIS — E876 Hypokalemia: Secondary | ICD-10-CM | POA: Diagnosis not present

## 2024-03-05 DIAGNOSIS — Z5181 Encounter for therapeutic drug level monitoring: Secondary | ICD-10-CM | POA: Diagnosis not present

## 2024-03-05 MED ORDER — MYCOPHENOLATE SODIUM 180 MG PO TBEC
180.0000 mg | DELAYED_RELEASE_TABLET | Freq: Two times a day (BID) | ORAL | 3 refills | Status: DC
  Filled 2024-03-05: qty 180, 90d supply, fill #0

## 2024-03-05 MED ORDER — TACROLIMUS 1 MG PO CAPS
ORAL_CAPSULE | ORAL | 3 refills | Status: DC
  Filled 2024-03-05: qty 270, 90d supply, fill #0

## 2024-03-05 MED ORDER — PREDNISONE 5 MG PO TABS
5.0000 mg | ORAL_TABLET | Freq: Every day | ORAL | 3 refills | Status: DC
  Filled 2024-03-05: qty 90, 90d supply, fill #0

## 2024-03-09 DIAGNOSIS — M25512 Pain in left shoulder: Secondary | ICD-10-CM | POA: Diagnosis not present

## 2024-03-09 DIAGNOSIS — M25612 Stiffness of left shoulder, not elsewhere classified: Secondary | ICD-10-CM | POA: Diagnosis not present

## 2024-03-09 DIAGNOSIS — M542 Cervicalgia: Secondary | ICD-10-CM | POA: Diagnosis not present

## 2024-03-11 ENCOUNTER — Encounter: Payer: Self-pay | Admitting: Oncology

## 2024-03-11 ENCOUNTER — Other Ambulatory Visit: Payer: Self-pay

## 2024-03-11 ENCOUNTER — Other Ambulatory Visit: Payer: Self-pay | Admitting: Family Medicine

## 2024-03-11 DIAGNOSIS — N184 Chronic kidney disease, stage 4 (severe): Secondary | ICD-10-CM | POA: Diagnosis not present

## 2024-03-11 DIAGNOSIS — D631 Anemia in chronic kidney disease: Secondary | ICD-10-CM | POA: Diagnosis not present

## 2024-03-11 DIAGNOSIS — Z79621 Long term (current) use of calcineurin inhibitor: Secondary | ICD-10-CM | POA: Diagnosis not present

## 2024-03-11 DIAGNOSIS — Z5181 Encounter for therapeutic drug level monitoring: Secondary | ICD-10-CM | POA: Diagnosis not present

## 2024-03-11 DIAGNOSIS — D849 Immunodeficiency, unspecified: Secondary | ICD-10-CM | POA: Diagnosis not present

## 2024-03-11 DIAGNOSIS — Z7960 Long term (current) use of unspecified immunomodulators and immunosuppressants: Secondary | ICD-10-CM | POA: Diagnosis not present

## 2024-03-11 DIAGNOSIS — Z940 Kidney transplant status: Secondary | ICD-10-CM | POA: Diagnosis not present

## 2024-03-11 DIAGNOSIS — E876 Hypokalemia: Secondary | ICD-10-CM | POA: Diagnosis not present

## 2024-03-11 DIAGNOSIS — E559 Vitamin D deficiency, unspecified: Secondary | ICD-10-CM | POA: Diagnosis not present

## 2024-03-11 DIAGNOSIS — K219 Gastro-esophageal reflux disease without esophagitis: Secondary | ICD-10-CM

## 2024-03-11 DIAGNOSIS — I10 Essential (primary) hypertension: Secondary | ICD-10-CM | POA: Diagnosis not present

## 2024-03-12 ENCOUNTER — Other Ambulatory Visit: Payer: Self-pay

## 2024-03-12 MED ORDER — MOUNJARO 5 MG/0.5ML ~~LOC~~ SOAJ
5.0000 mg | SUBCUTANEOUS | 3 refills | Status: DC
  Filled 2024-03-12: qty 2, 28d supply, fill #0
  Filled 2024-03-16 – 2024-03-17 (×2): qty 6, 84d supply, fill #0
  Filled ????-??-??: fill #0

## 2024-03-12 MED FILL — Omeprazole Cap Delayed Release 20 MG: ORAL | 30 days supply | Qty: 30 | Fill #0 | Status: AC

## 2024-03-13 ENCOUNTER — Other Ambulatory Visit: Payer: Self-pay

## 2024-03-13 MED FILL — Tadalafil Tab 20 MG: ORAL | 50 days supply | Qty: 10 | Fill #1 | Status: AC

## 2024-03-15 ENCOUNTER — Other Ambulatory Visit: Payer: Self-pay

## 2024-03-16 ENCOUNTER — Other Ambulatory Visit: Payer: Self-pay

## 2024-03-16 DIAGNOSIS — A630 Anogenital (venereal) warts: Secondary | ICD-10-CM | POA: Diagnosis not present

## 2024-03-16 DIAGNOSIS — D849 Immunodeficiency, unspecified: Secondary | ICD-10-CM | POA: Diagnosis not present

## 2024-03-16 DIAGNOSIS — K6282 Dysplasia of anus: Secondary | ICD-10-CM | POA: Diagnosis not present

## 2024-03-16 MED ORDER — OXYCODONE-ACETAMINOPHEN 10-325 MG PO TABS
1.0000 | ORAL_TABLET | Freq: Four times a day (QID) | ORAL | 0 refills | Status: DC | PRN
  Filled 2024-03-16: qty 20, 5d supply, fill #0

## 2024-03-17 ENCOUNTER — Other Ambulatory Visit: Payer: Self-pay

## 2024-03-23 ENCOUNTER — Other Ambulatory Visit: Payer: Self-pay | Admitting: Physician Assistant

## 2024-03-23 ENCOUNTER — Other Ambulatory Visit: Payer: Self-pay

## 2024-03-23 DIAGNOSIS — N411 Chronic prostatitis: Secondary | ICD-10-CM

## 2024-03-24 ENCOUNTER — Other Ambulatory Visit: Payer: Self-pay

## 2024-03-28 ENCOUNTER — Other Ambulatory Visit: Payer: Self-pay

## 2024-03-29 ENCOUNTER — Telehealth: Payer: Self-pay

## 2024-03-29 ENCOUNTER — Ambulatory Visit: Payer: Self-pay

## 2024-03-29 NOTE — Telephone Encounter (Signed)
 FYI Only or Action Required?: FYI only for provider: Advised UC today.  Patient was last seen in primary care on 02/09/2024 by [name], MD.  Called Nurse Triage reporting Fatigue and Cough.  Symptoms began several days ago.  Interventions attempted: Prescription medications: Albuterol  nebulizer.  Symptoms are: gradually worsening.  Triage Disposition: See HCP Within 4 Hours (Or PCP Triage)  Patient/caregiver understands and will follow disposition?: Yes     Spoke with pts [place] (on DPR). Not with the pt currently. NYE onset of moderate dry cough, chest congestion and mild SOB only when coughing. Has been using nebulizer recently, about 1x/day. 2 days ago onset increased weakness/lethargy, poor PO intake. No fever. No CP.  Hx of CHF with ankle swelling, has gotten a little worse. Has hx of kidney transplant and extensive cardiac hx.   Advised to be seen in next 4 hours. Offices now closed. Wife agreeable, she will see if pt agreeable to go to UC today or not. States he often bypasses UC and goes straight to the ED. Advised ED or calling 911 for worsening symptoms.      Copied from CRM [id number]. Topic: Appointments - Appointment Scheduling >> Mar 29, 2024  4:46 PM Tonda B wrote: Patient/patient representative is calling to schedule an appointment. Refer to attachments for appointment information.  Patient wife pt calling pt is having shortness of breath Reason for Disposition  [1] Ankle swelling AND [2] swelling is increasing  Answer Assessment - Initial Assessment Questions 1. ONSET: When did the cough begin?      NYE  2. SEVERITY: How bad is the cough today?      Moderate  3. SPUTUM: Describe the color of your sputum (e.g., none, dry cough; clear, white, yellow, green)     Denies  4. HEMOPTYSIS: Are you coughing up any blood? If Yes, ask: How much? (e.g., flecks, streaks, tablespoons, etc.)     Denies  5. DIFFICULTY BREATHING: Are you having  difficulty breathing? If Yes, ask: How bad is it? (e.g., mild, moderate, severe)      Mild SOB  6. FEVER: Do you have a fever? If Yes, ask: What is your temperature, how was it measured, and when did it start?     Denies  7. CARDIAC HISTORY: Do you have any history of heart disease? (e.g., heart attack, congestive heart failure)      CABG, CHF, pulm htn, chronic venous insufficiency, afib  8. LUNG HISTORY: Do you have any history of lung disease?  (e.g., pulmonary embolus, asthma, emphysema)     Asthma, OSA  9. PE RISK FACTORS: Do you have a history of blood clots? (or: recent major surgery, recent prolonged travel, bedridden)     Denies  10. OTHER SYMPTOMS: Do you have any other symptoms? (e.g., runny nose, wheezing, chest pain)       Chest congestion, lethargy, poor PO intake  12. TRAVEL: Have you traveled out of the country in the last month? (e.g., travel history, exposures)       Denies  Protocols used: Cough - Acute Non-Productive-A-AH

## 2024-03-29 NOTE — Telephone Encounter (Signed)
 Copied from CRM [id number]. Topic: General - Other >> Mar 29, 2024 11:17 AM Tonda B wrote: Reason for CRM: patient missed call from office please call pt back [phone]

## 2024-03-30 ENCOUNTER — Encounter

## 2024-03-30 IMAGING — CR DG CHEST 2V
2 series · 2 of 2 positions shown · non-contrast
Comparison: 07/12/2019

CLINICAL DATA: Short of breath

EXAM:
CHEST - 2 VIEW

[chest lat]
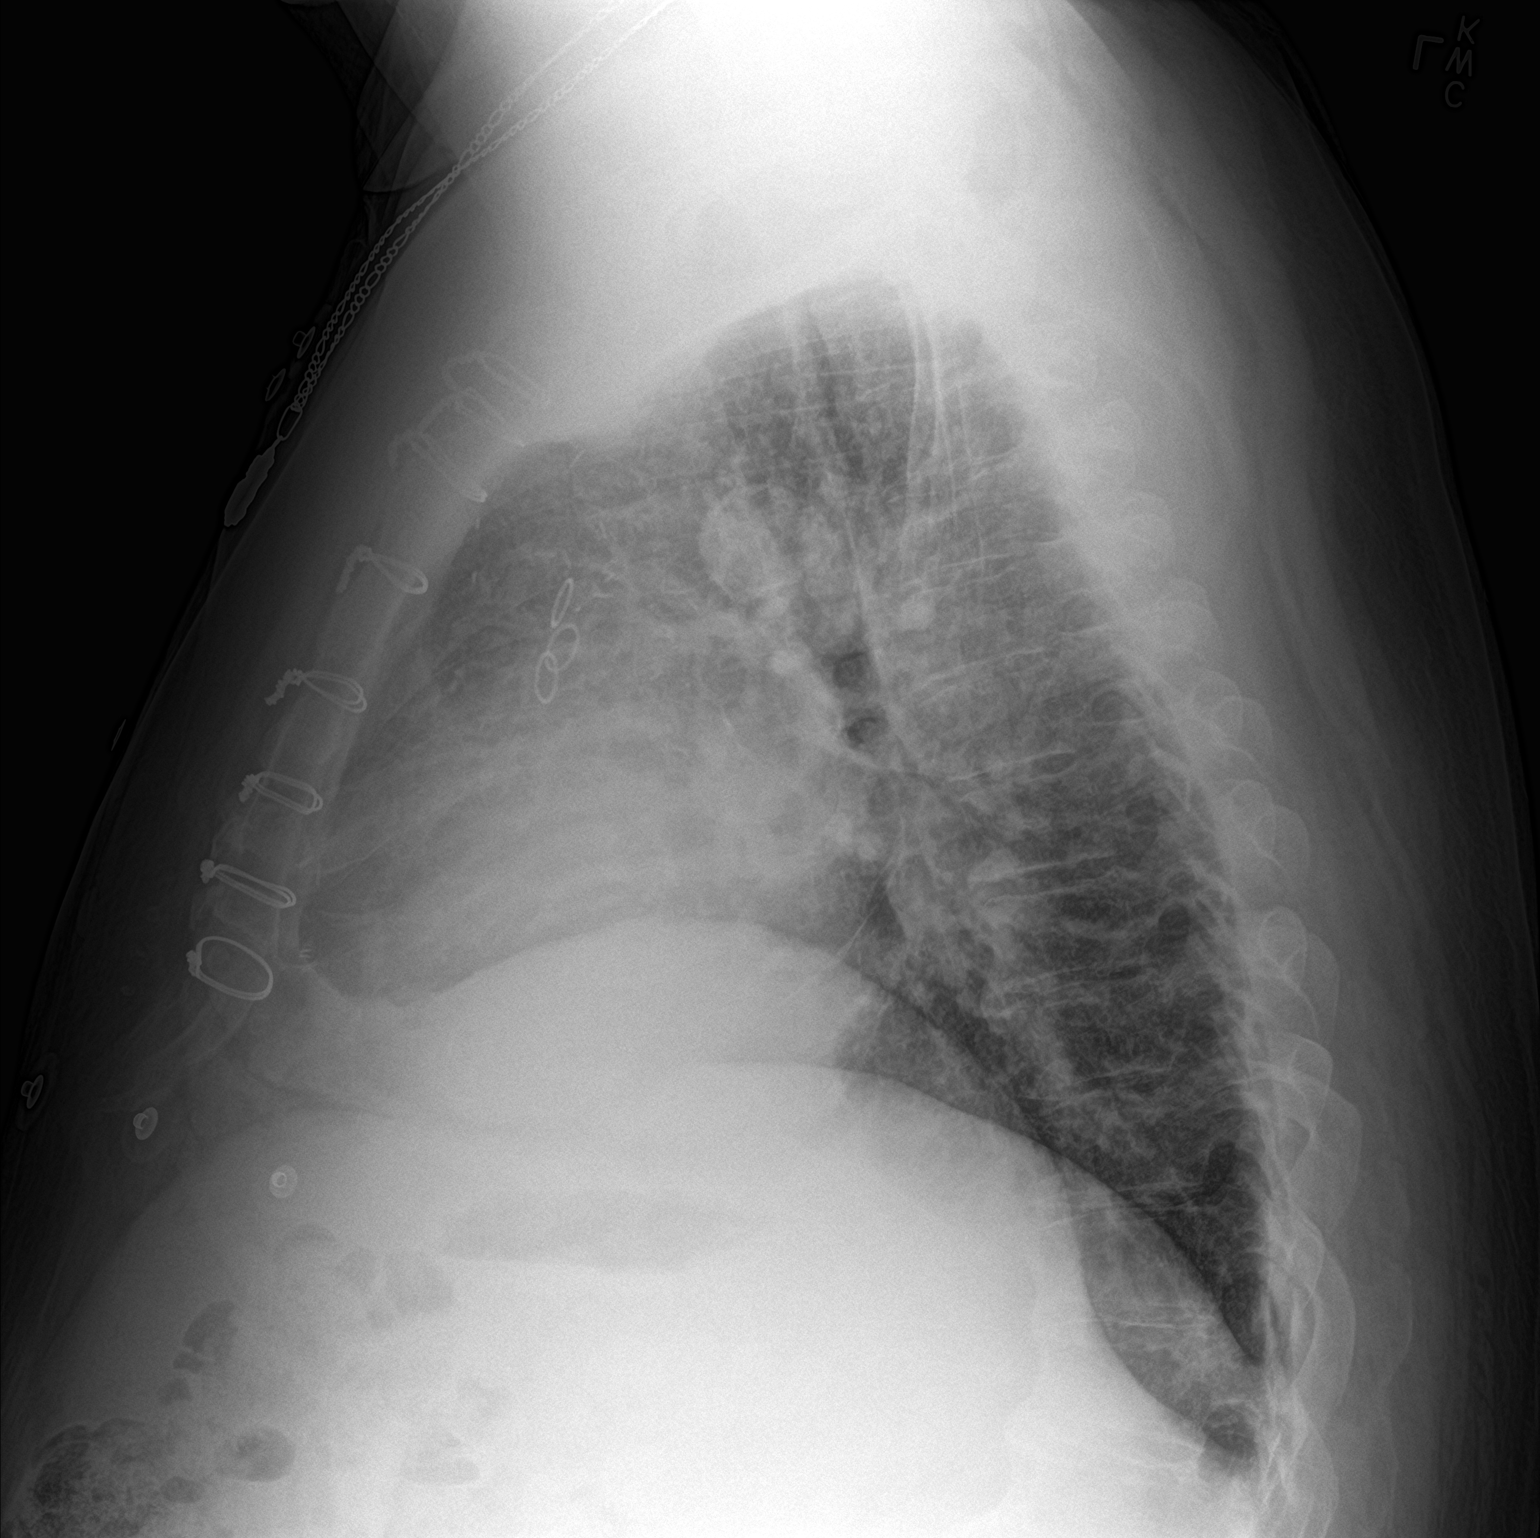

[chest pa]
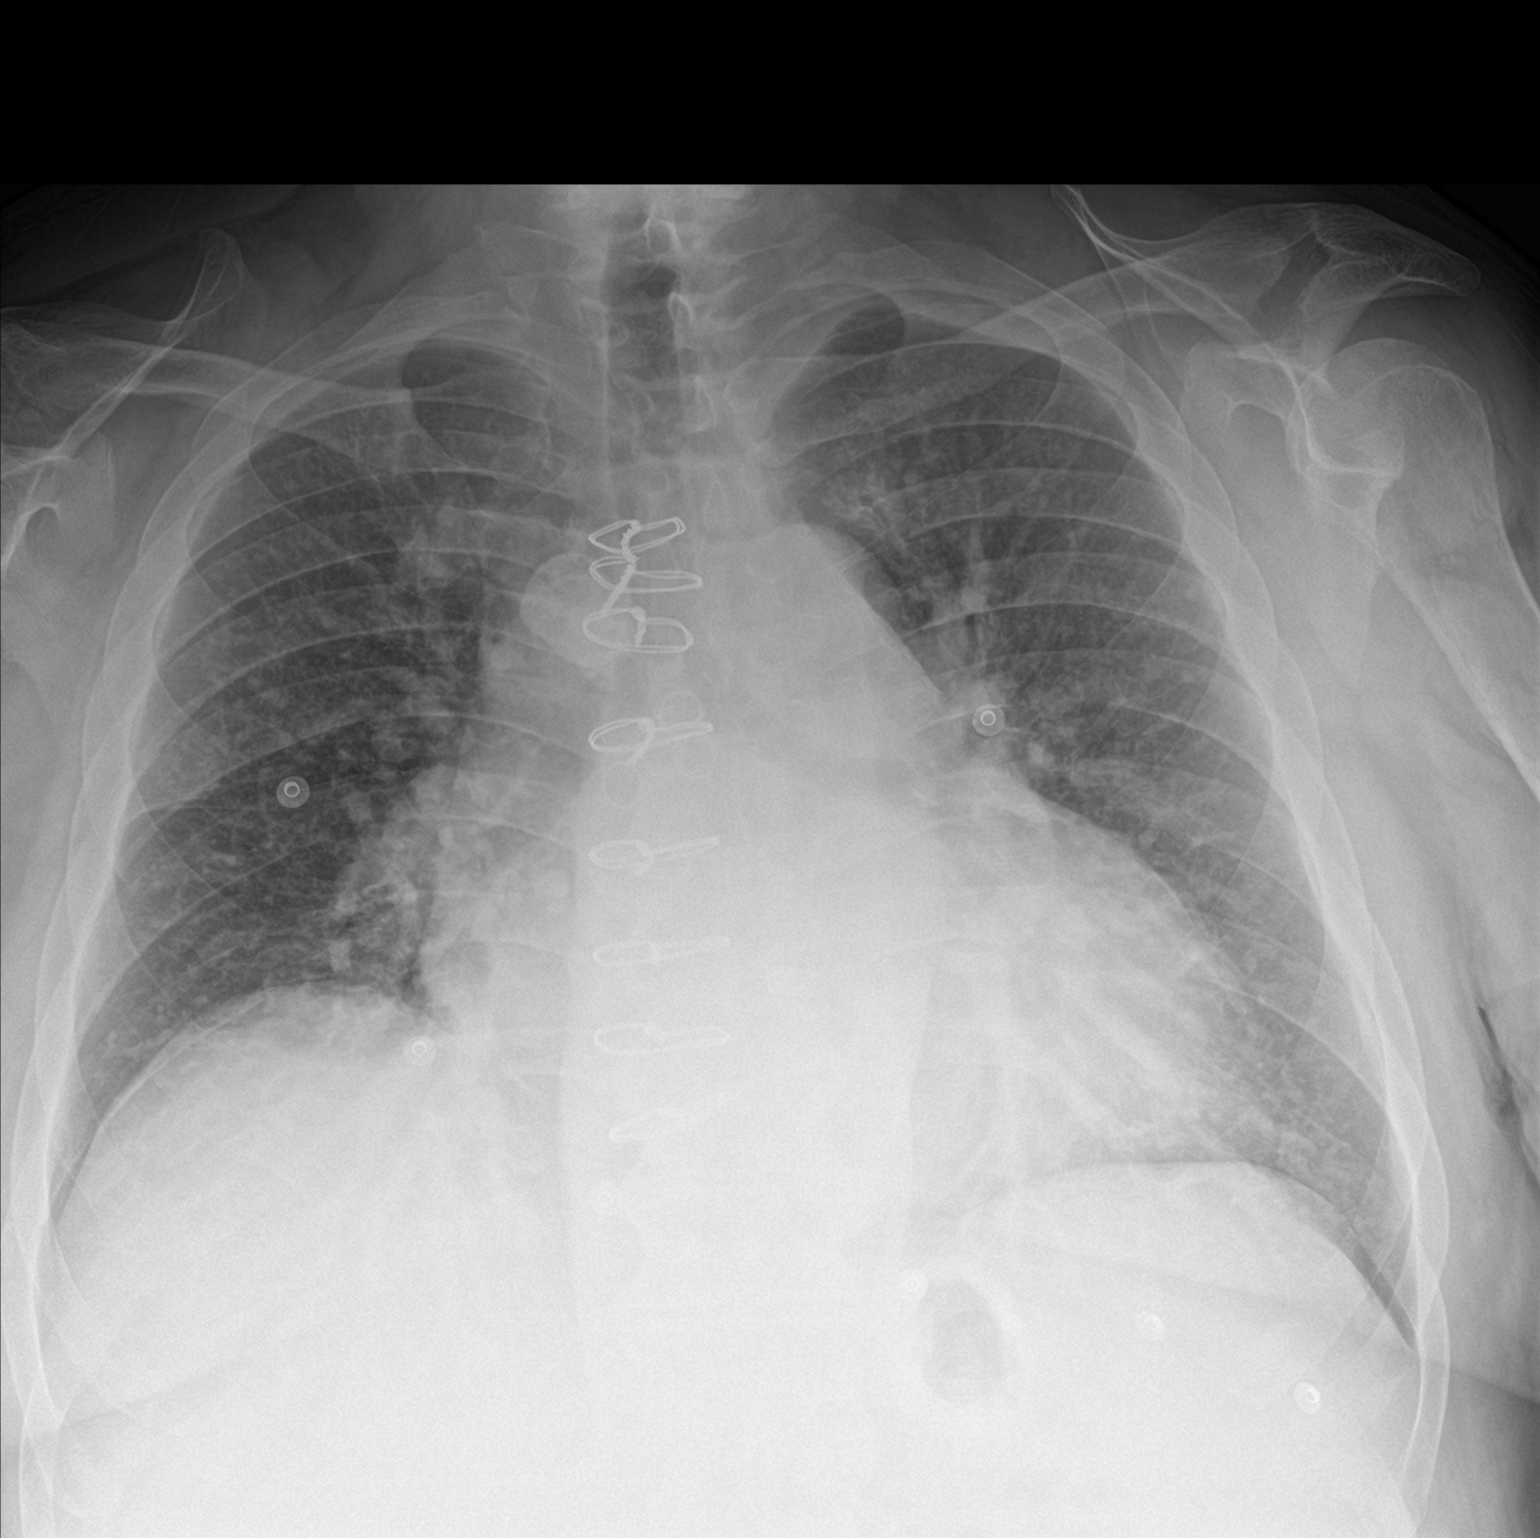

[2 of 2 positions shown; findings below may reference images not displayed]

FINDINGS: Prior CABG. Cardiac enlargement. Mild vascular congestion. Negative
for pulmonary edema or pleural effusion. Negative for pneumonia.

Calcified right paratracheal lymph node unchanged.
IMPRESSION: Cardiac enlargement with mild vascular congestion. Negative for
edema or effusion.

## 2024-03-30 NOTE — Telephone Encounter (Signed)
 Plz check to see if pt evaluated yesterday at [facility].  If not rec OV for further evaluation.

## 2024-03-30 NOTE — Telephone Encounter (Signed)
 Called and spoke with pt

## 2024-03-30 NOTE — Telephone Encounter (Signed)
 Called  and spoke to pt. Pt has virtual visit with PA today. Pt has visit in our office this Friday. Stated he will call back to let us  not of any concerns of questions

## 2024-04-01 ENCOUNTER — Other Ambulatory Visit: Payer: Self-pay | Admitting: Family Medicine

## 2024-04-01 ENCOUNTER — Other Ambulatory Visit: Payer: Self-pay

## 2024-04-01 DIAGNOSIS — J45998 Other asthma: Secondary | ICD-10-CM

## 2024-04-02 ENCOUNTER — Ambulatory Visit: Admitting: Family Medicine

## 2024-04-05 ENCOUNTER — Other Ambulatory Visit: Payer: Self-pay

## 2024-04-05 MED FILL — Albuterol Sulfate Soln Nebu 0.083% (2.5 MG/3ML): RESPIRATORY_TRACT | 13 days supply | Qty: 150 | Fill #0 | Status: CN

## 2024-04-06 ENCOUNTER — Telehealth: Payer: Self-pay | Admitting: Cardiology

## 2024-04-07 ENCOUNTER — Emergency Department

## 2024-04-07 ENCOUNTER — Other Ambulatory Visit: Payer: Self-pay

## 2024-04-07 ENCOUNTER — Ambulatory Visit: Admission: EM | Admit: 2024-04-07 | Discharge: 2024-04-07 | Disposition: A | Discharge status: Home / Self Care

## 2024-04-07 ENCOUNTER — Inpatient Hospital Stay
Admission: EM | Admit: 2024-04-07 | Discharge: 2024-04-25 | DRG: 291 | Disposition: E | Attending: Internal Medicine | Admitting: Internal Medicine

## 2024-04-07 DIAGNOSIS — Z8419 Family history of other disorders of kidney and ureter: Secondary | ICD-10-CM

## 2024-04-07 DIAGNOSIS — Y841 Kidney dialysis as the cause of abnormal reaction of the patient, or of later complication, without mention of misadventure at the time of the procedure: Secondary | ICD-10-CM | POA: Diagnosis present

## 2024-04-07 DIAGNOSIS — Z8616 Personal history of COVID-19: Secondary | ICD-10-CM

## 2024-04-07 DIAGNOSIS — Z7960 Long term (current) use of unspecified immunomodulators and immunosuppressants: Secondary | ICD-10-CM

## 2024-04-07 DIAGNOSIS — J9601 Acute respiratory failure with hypoxia: Principal | ICD-10-CM | POA: Diagnosis present

## 2024-04-07 DIAGNOSIS — Z9889 Other specified postprocedural states: Secondary | ICD-10-CM

## 2024-04-07 DIAGNOSIS — R1900 Intra-abdominal and pelvic swelling, mass and lump, unspecified site: Secondary | ICD-10-CM | POA: Diagnosis present

## 2024-04-07 DIAGNOSIS — Z5982 Transportation insecurity: Secondary | ICD-10-CM

## 2024-04-07 DIAGNOSIS — I080 Rheumatic disorders of both mitral and aortic valves: Secondary | ICD-10-CM | POA: Diagnosis present

## 2024-04-07 DIAGNOSIS — R0602 Shortness of breath: Secondary | ICD-10-CM

## 2024-04-07 DIAGNOSIS — K219 Gastro-esophageal reflux disease without esophagitis: Secondary | ICD-10-CM | POA: Diagnosis present

## 2024-04-07 DIAGNOSIS — Z803 Family history of malignant neoplasm of breast: Secondary | ICD-10-CM

## 2024-04-07 DIAGNOSIS — Z515 Encounter for palliative care: Secondary | ICD-10-CM

## 2024-04-07 DIAGNOSIS — G9341 Metabolic encephalopathy: Secondary | ICD-10-CM | POA: Diagnosis not present

## 2024-04-07 DIAGNOSIS — D62 Acute posthemorrhagic anemia: Secondary | ICD-10-CM | POA: Diagnosis not present

## 2024-04-07 DIAGNOSIS — N138 Other obstructive and reflux uropathy: Secondary | ICD-10-CM | POA: Diagnosis present

## 2024-04-07 DIAGNOSIS — E1122 Type 2 diabetes mellitus with diabetic chronic kidney disease: Secondary | ICD-10-CM | POA: Diagnosis present

## 2024-04-07 DIAGNOSIS — Z9841 Cataract extraction status, right eye: Secondary | ICD-10-CM

## 2024-04-07 DIAGNOSIS — E875 Hyperkalemia: Secondary | ICD-10-CM | POA: Diagnosis not present

## 2024-04-07 DIAGNOSIS — Z8719 Personal history of other diseases of the digestive system: Secondary | ICD-10-CM

## 2024-04-07 DIAGNOSIS — I482 Chronic atrial fibrillation, unspecified: Secondary | ICD-10-CM | POA: Diagnosis present

## 2024-04-07 DIAGNOSIS — E66813 Obesity, class 3: Secondary | ICD-10-CM | POA: Diagnosis present

## 2024-04-07 DIAGNOSIS — Z66 Do not resuscitate: Secondary | ICD-10-CM | POA: Diagnosis not present

## 2024-04-07 DIAGNOSIS — D631 Anemia in chronic kidney disease: Secondary | ICD-10-CM | POA: Diagnosis present

## 2024-04-07 DIAGNOSIS — I5043 Acute on chronic combined systolic (congestive) and diastolic (congestive) heart failure: Secondary | ICD-10-CM | POA: Diagnosis present

## 2024-04-07 DIAGNOSIS — J4489 Other specified chronic obstructive pulmonary disease: Secondary | ICD-10-CM | POA: Diagnosis present

## 2024-04-07 DIAGNOSIS — R42 Dizziness and giddiness: Secondary | ICD-10-CM

## 2024-04-07 DIAGNOSIS — T8610 Unspecified complication of kidney transplant: Secondary | ICD-10-CM

## 2024-04-07 DIAGNOSIS — I5020 Unspecified systolic (congestive) heart failure: Secondary | ICD-10-CM

## 2024-04-07 DIAGNOSIS — T82838A Hemorrhage of vascular prosthetic devices, implants and grafts, initial encounter: Secondary | ICD-10-CM | POA: Diagnosis present

## 2024-04-07 DIAGNOSIS — Y828 Other medical devices associated with adverse incidents: Secondary | ICD-10-CM | POA: Diagnosis present

## 2024-04-07 DIAGNOSIS — I493 Ventricular premature depolarization: Secondary | ICD-10-CM | POA: Diagnosis present

## 2024-04-07 DIAGNOSIS — Z955 Presence of coronary angioplasty implant and graft: Secondary | ICD-10-CM

## 2024-04-07 DIAGNOSIS — Z1152 Encounter for screening for COVID-19: Secondary | ICD-10-CM

## 2024-04-07 DIAGNOSIS — Z560 Unemployment, unspecified: Secondary | ICD-10-CM

## 2024-04-07 DIAGNOSIS — N170 Acute kidney failure with tubular necrosis: Secondary | ICD-10-CM | POA: Diagnosis present

## 2024-04-07 DIAGNOSIS — Y830 Surgical operation with transplant of whole organ as the cause of abnormal reaction of the patient, or of later complication, without mention of misadventure at the time of the procedure: Secondary | ICD-10-CM | POA: Diagnosis present

## 2024-04-07 DIAGNOSIS — Z833 Family history of diabetes mellitus: Secondary | ICD-10-CM

## 2024-04-07 DIAGNOSIS — E1169 Type 2 diabetes mellitus with other specified complication: Secondary | ICD-10-CM | POA: Diagnosis present

## 2024-04-07 DIAGNOSIS — N390 Urinary tract infection, site not specified: Secondary | ICD-10-CM | POA: Diagnosis present

## 2024-04-07 DIAGNOSIS — Z9884 Bariatric surgery status: Secondary | ICD-10-CM

## 2024-04-07 DIAGNOSIS — F329 Major depressive disorder, single episode, unspecified: Secondary | ICD-10-CM | POA: Diagnosis present

## 2024-04-07 DIAGNOSIS — B9789 Other viral agents as the cause of diseases classified elsewhere: Secondary | ICD-10-CM | POA: Diagnosis present

## 2024-04-07 DIAGNOSIS — R0902 Hypoxemia: Secondary | ICD-10-CM | POA: Diagnosis not present

## 2024-04-07 DIAGNOSIS — Z992 Dependence on renal dialysis: Secondary | ICD-10-CM

## 2024-04-07 DIAGNOSIS — G4733 Obstructive sleep apnea (adult) (pediatric): Secondary | ICD-10-CM | POA: Diagnosis present

## 2024-04-07 DIAGNOSIS — Z794 Long term (current) use of insulin: Secondary | ICD-10-CM

## 2024-04-07 DIAGNOSIS — Z9181 History of falling: Secondary | ICD-10-CM

## 2024-04-07 DIAGNOSIS — Z7952 Long term (current) use of systemic steroids: Secondary | ICD-10-CM

## 2024-04-07 DIAGNOSIS — Z961 Presence of intraocular lens: Secondary | ICD-10-CM | POA: Diagnosis present

## 2024-04-07 DIAGNOSIS — Z9842 Cataract extraction status, left eye: Secondary | ICD-10-CM

## 2024-04-07 DIAGNOSIS — Z6837 Body mass index (BMI) 37.0-37.9, adult: Secondary | ICD-10-CM

## 2024-04-07 DIAGNOSIS — R197 Diarrhea, unspecified: Secondary | ICD-10-CM | POA: Diagnosis present

## 2024-04-07 DIAGNOSIS — I4729 Other ventricular tachycardia: Secondary | ICD-10-CM | POA: Diagnosis present

## 2024-04-07 DIAGNOSIS — T8619 Other complication of kidney transplant: Secondary | ICD-10-CM | POA: Diagnosis present

## 2024-04-07 DIAGNOSIS — I10 Essential (primary) hypertension: Secondary | ICD-10-CM | POA: Diagnosis present

## 2024-04-07 DIAGNOSIS — Z87891 Personal history of nicotine dependence: Secondary | ICD-10-CM

## 2024-04-07 DIAGNOSIS — Z800 Family history of malignant neoplasm of digestive organs: Secondary | ICD-10-CM

## 2024-04-07 DIAGNOSIS — I2510 Atherosclerotic heart disease of native coronary artery without angina pectoris: Secondary | ICD-10-CM | POA: Diagnosis present

## 2024-04-07 DIAGNOSIS — F419 Anxiety disorder, unspecified: Secondary | ICD-10-CM | POA: Diagnosis present

## 2024-04-07 DIAGNOSIS — I700 Atherosclerosis of aorta: Secondary | ICD-10-CM | POA: Diagnosis present

## 2024-04-07 DIAGNOSIS — I255 Ischemic cardiomyopathy: Secondary | ICD-10-CM | POA: Diagnosis present

## 2024-04-07 DIAGNOSIS — I2721 Secondary pulmonary arterial hypertension: Secondary | ICD-10-CM | POA: Diagnosis present

## 2024-04-07 DIAGNOSIS — I5023 Acute on chronic systolic (congestive) heart failure: Secondary | ICD-10-CM | POA: Diagnosis present

## 2024-04-07 DIAGNOSIS — Z8249 Family history of ischemic heart disease and other diseases of the circulatory system: Secondary | ICD-10-CM

## 2024-04-07 DIAGNOSIS — R451 Restlessness and agitation: Secondary | ICD-10-CM | POA: Diagnosis not present

## 2024-04-07 DIAGNOSIS — N186 End stage renal disease: Secondary | ICD-10-CM | POA: Diagnosis present

## 2024-04-07 DIAGNOSIS — N400 Enlarged prostate without lower urinary tract symptoms: Secondary | ICD-10-CM | POA: Diagnosis present

## 2024-04-07 DIAGNOSIS — I509 Heart failure, unspecified: Secondary | ICD-10-CM

## 2024-04-07 DIAGNOSIS — J9602 Acute respiratory failure with hypercapnia: Secondary | ICD-10-CM | POA: Diagnosis present

## 2024-04-07 DIAGNOSIS — N185 Chronic kidney disease, stage 5: Secondary | ICD-10-CM | POA: Diagnosis present

## 2024-04-07 DIAGNOSIS — R001 Bradycardia, unspecified: Secondary | ICD-10-CM | POA: Diagnosis present

## 2024-04-07 DIAGNOSIS — T8241XA Breakdown (mechanical) of vascular dialysis catheter, initial encounter: Secondary | ICD-10-CM | POA: Diagnosis present

## 2024-04-07 DIAGNOSIS — D539 Nutritional anemia, unspecified: Secondary | ICD-10-CM | POA: Diagnosis present

## 2024-04-07 DIAGNOSIS — E860 Dehydration: Secondary | ICD-10-CM | POA: Diagnosis present

## 2024-04-07 DIAGNOSIS — I5022 Chronic systolic (congestive) heart failure: Secondary | ICD-10-CM | POA: Diagnosis present

## 2024-04-07 DIAGNOSIS — L97818 Non-pressure chronic ulcer of other part of right lower leg with other specified severity: Secondary | ICD-10-CM | POA: Diagnosis present

## 2024-04-07 DIAGNOSIS — Y832 Surgical operation with anastomosis, bypass or graft as the cause of abnormal reaction of the patient, or of later complication, without mention of misadventure at the time of the procedure: Secondary | ICD-10-CM | POA: Diagnosis present

## 2024-04-07 DIAGNOSIS — N401 Enlarged prostate with lower urinary tract symptoms: Secondary | ICD-10-CM | POA: Diagnosis present

## 2024-04-07 DIAGNOSIS — Z7984 Long term (current) use of oral hypoglycemic drugs: Secondary | ICD-10-CM

## 2024-04-07 DIAGNOSIS — E7849 Other hyperlipidemia: Secondary | ICD-10-CM | POA: Diagnosis present

## 2024-04-07 DIAGNOSIS — Z86718 Personal history of other venous thrombosis and embolism: Secondary | ICD-10-CM

## 2024-04-07 DIAGNOSIS — I878 Other specified disorders of veins: Secondary | ICD-10-CM | POA: Diagnosis present

## 2024-04-07 DIAGNOSIS — Z7901 Long term (current) use of anticoagulants: Secondary | ICD-10-CM

## 2024-04-07 DIAGNOSIS — D696 Thrombocytopenia, unspecified: Secondary | ICD-10-CM | POA: Diagnosis present

## 2024-04-07 DIAGNOSIS — J45998 Other asthma: Secondary | ICD-10-CM | POA: Diagnosis present

## 2024-04-07 DIAGNOSIS — I132 Hypertensive heart and chronic kidney disease with heart failure and with stage 5 chronic kidney disease, or end stage renal disease: Principal | ICD-10-CM | POA: Diagnosis present

## 2024-04-07 DIAGNOSIS — Z7985 Long-term (current) use of injectable non-insulin antidiabetic drugs: Secondary | ICD-10-CM

## 2024-04-07 DIAGNOSIS — E119 Type 2 diabetes mellitus without complications: Secondary | ICD-10-CM

## 2024-04-07 DIAGNOSIS — Z951 Presence of aortocoronary bypass graft: Secondary | ICD-10-CM

## 2024-04-07 DIAGNOSIS — K766 Portal hypertension: Secondary | ICD-10-CM | POA: Diagnosis present

## 2024-04-07 DIAGNOSIS — Z886 Allergy status to analgesic agent status: Secondary | ICD-10-CM

## 2024-04-07 DIAGNOSIS — I4821 Permanent atrial fibrillation: Secondary | ICD-10-CM | POA: Diagnosis present

## 2024-04-07 DIAGNOSIS — M109 Gout, unspecified: Secondary | ICD-10-CM | POA: Diagnosis present

## 2024-04-07 DIAGNOSIS — I959 Hypotension, unspecified: Secondary | ICD-10-CM | POA: Diagnosis not present

## 2024-04-07 DIAGNOSIS — I447 Left bundle-branch block, unspecified: Secondary | ICD-10-CM | POA: Diagnosis present

## 2024-04-07 DIAGNOSIS — I872 Venous insufficiency (chronic) (peripheral): Secondary | ICD-10-CM | POA: Diagnosis present

## 2024-04-07 DIAGNOSIS — Z91041 Radiographic dye allergy status: Secondary | ICD-10-CM

## 2024-04-07 DIAGNOSIS — Z79899 Other long term (current) drug therapy: Secondary | ICD-10-CM

## 2024-04-07 DIAGNOSIS — I2489 Other forms of acute ischemic heart disease: Secondary | ICD-10-CM | POA: Diagnosis present

## 2024-04-07 DIAGNOSIS — D509 Iron deficiency anemia, unspecified: Secondary | ICD-10-CM | POA: Diagnosis present

## 2024-04-07 DIAGNOSIS — L819 Disorder of pigmentation, unspecified: Secondary | ICD-10-CM | POA: Diagnosis present

## 2024-04-07 LAB — COMPREHENSIVE METABOLIC PANEL WITH GFR
ALT: 16 U/L (ref 0–44)
AST: 26 U/L (ref 15–41)
Albumin: 4.1 g/dL (ref 3.5–5.0)
Alkaline Phosphatase: 203 U/L — ABNORMAL HIGH (ref 38–126)
Anion gap: 14 (ref 5–15)
BUN: 100 mg/dL — ABNORMAL HIGH (ref 8–23)
CO2: 25 mmol/L (ref 22–32)
Calcium: 8.9 mg/dL (ref 8.9–10.3)
Chloride: 104 mmol/L (ref 98–111)
Creatinine, Ser: 4.75 mg/dL — ABNORMAL HIGH (ref 0.61–1.24)
GFR, Estimated: 13 mL/min — ABNORMAL LOW
Glucose, Bld: 121 mg/dL — ABNORMAL HIGH (ref 70–99)
Potassium: 4.3 mmol/L (ref 3.5–5.1)
Sodium: 144 mmol/L (ref 135–145)
Total Bilirubin: 0.8 mg/dL (ref 0.0–1.2)
Total Protein: 7.3 g/dL (ref 6.5–8.1)

## 2024-04-07 LAB — URINALYSIS, COMPLETE (UACMP) WITH MICROSCOPIC
Bilirubin Urine: NEGATIVE
Glucose, UA: 50 mg/dL — AB
Ketones, ur: NEGATIVE mg/dL
Nitrite: NEGATIVE
Protein, ur: 30 mg/dL — AB
Specific Gravity, Urine: 1.013 (ref 1.005–1.030)
pH: 5 (ref 5.0–8.0)

## 2024-04-07 LAB — BASIC METABOLIC PANEL WITH GFR
Anion gap: 15 (ref 5–15)
BUN: 102 mg/dL — ABNORMAL HIGH (ref 8–23)
CO2: 25 mmol/L (ref 22–32)
Calcium: 8.7 mg/dL — ABNORMAL LOW (ref 8.9–10.3)
Chloride: 103 mmol/L (ref 98–111)
Creatinine, Ser: 4.95 mg/dL — ABNORMAL HIGH (ref 0.61–1.24)
GFR, Estimated: 12 mL/min — ABNORMAL LOW
Glucose, Bld: 159 mg/dL — ABNORMAL HIGH (ref 70–99)
Potassium: 4.5 mmol/L (ref 3.5–5.1)
Sodium: 143 mmol/L (ref 135–145)

## 2024-04-07 LAB — CBC WITH DIFFERENTIAL/PLATELET
Abs Immature Granulocytes: 0.06 K/uL (ref 0.00–0.07)
Basophils Absolute: 0 K/uL (ref 0.0–0.1)
Basophils Relative: 1 %
Eosinophils Absolute: 0 K/uL (ref 0.0–0.5)
Eosinophils Relative: 0 %
HCT: 38.1 % — ABNORMAL LOW (ref 39.0–52.0)
Hemoglobin: 11.3 g/dL — ABNORMAL LOW (ref 13.0–17.0)
Immature Granulocytes: 1 %
Lymphocytes Relative: 14 %
Lymphs Abs: 0.6 K/uL — ABNORMAL LOW (ref 0.7–4.0)
MCH: 30.5 pg (ref 26.0–34.0)
MCHC: 29.7 g/dL — ABNORMAL LOW (ref 30.0–36.0)
MCV: 102.7 fL — ABNORMAL HIGH (ref 80.0–100.0)
Monocytes Absolute: 0.2 K/uL (ref 0.1–1.0)
Monocytes Relative: 5 %
Neutro Abs: 3.6 K/uL (ref 1.7–7.7)
Neutrophils Relative %: 79 %
Platelets: 82 K/uL — ABNORMAL LOW (ref 150–400)
RBC: 3.71 MIL/uL — ABNORMAL LOW (ref 4.22–5.81)
RDW: 16.9 % — ABNORMAL HIGH (ref 11.5–15.5)
Smear Review: NORMAL
WBC: 4.5 K/uL (ref 4.0–10.5)
nRBC: 0 % (ref 0.0–0.2)

## 2024-04-07 LAB — TROPONIN T, HIGH SENSITIVITY
Troponin T High Sensitivity: 129 ng/L (ref 0–19)
Troponin T High Sensitivity: 118 ng/L (ref 0–19)

## 2024-04-07 LAB — BLOOD GAS, VENOUS
Acid-Base Excess: 3.4 mmol/L — ABNORMAL HIGH (ref 0.0–2.0)
Bicarbonate: 32.8 mmol/L — ABNORMAL HIGH (ref 20.0–28.0)
O2 Saturation: 57.1 %
Patient temperature: 37
pCO2, Ven: 73 mmHg (ref 44–60)
pH, Ven: 7.26 (ref 7.25–7.43)
pO2, Ven: 37 mmHg (ref 32–45)

## 2024-04-07 LAB — OSMOLALITY, URINE: Osmolality, Ur: 347 mosm/kg (ref 300–900)

## 2024-04-07 LAB — LACTIC ACID, PLASMA: Lactic Acid, Venous: 1.3 mmol/L (ref 0.5–1.9)

## 2024-04-07 LAB — MAGNESIUM: Magnesium: 2.9 mg/dL — ABNORMAL HIGH (ref 1.7–2.4)

## 2024-04-07 LAB — RESP PANEL BY RT-PCR (RSV, FLU A&B, COVID)  RVPGX2
Influenza A by PCR: NEGATIVE
Influenza B by PCR: NEGATIVE
Resp Syncytial Virus by PCR: NEGATIVE
SARS Coronavirus 2 by RT PCR: NEGATIVE

## 2024-04-07 LAB — CREATININE, URINE, RANDOM: Creatinine, Urine: 114 mg/dL

## 2024-04-07 LAB — CBG MONITORING, ED: Glucose-Capillary: 160 mg/dL — ABNORMAL HIGH (ref 70–99)

## 2024-04-07 LAB — PRO BRAIN NATRIURETIC PEPTIDE: Pro Brain Natriuretic Peptide: >35000 pg/mL — ABNORMAL HIGH

## 2024-04-07 LAB — PROCALCITONIN: Procalcitonin: 0.31 ng/mL

## 2024-04-07 MED ORDER — ONDANSETRON HCL 4 MG PO TABS: 4.0000 mg | ORAL_TABLET | Freq: Four times a day (QID) | ORAL | Status: DC | PRN

## 2024-04-07 MED ORDER — PREDNISONE 10 MG PO TABS
5.0000 mg | ORAL_TABLET | Freq: Every day | ORAL | Status: DC
  Administered 2024-04-08 – 2024-04-11 (×4): 5 mg via ORAL
  Filled 2024-04-07 (×4): qty 1

## 2024-04-07 MED ORDER — FUROSEMIDE 10 MG/ML IJ SOLN
80.0000 mg | Freq: Two times a day (BID) | INTRAMUSCULAR | Status: AC
  Administered 2024-04-07 – 2024-04-11 (×8): 80 mg via INTRAVENOUS
  Filled 2024-04-07 (×9): qty 8

## 2024-04-07 MED ORDER — MYCOPHENOLATE SODIUM 180 MG PO TBEC
360.0000 mg | DELAYED_RELEASE_TABLET | Freq: Every morning | ORAL | Status: DC
  Administered 2024-04-08 – 2024-04-11 (×4): 360 mg via ORAL
  Filled 2024-04-07 (×4): qty 2

## 2024-04-07 MED ORDER — ACETAMINOPHEN 325 MG PO TABS
650.0000 mg | ORAL_TABLET | Freq: Four times a day (QID) | ORAL | Status: DC | PRN
  Administered 2024-04-09: 650 mg via ORAL
  Filled 2024-04-07 (×2): qty 2

## 2024-04-07 MED ORDER — SENNOSIDES-DOCUSATE SODIUM 8.6-50 MG PO TABS
1.0000 | ORAL_TABLET | Freq: Every evening | ORAL | Status: DC | PRN
  Filled 2024-04-07: qty 1

## 2024-04-07 MED ORDER — ONDANSETRON HCL 4 MG/2ML IJ SOLN: 4.0000 mg | Freq: Four times a day (QID) | INTRAMUSCULAR | Status: DC | PRN

## 2024-04-07 MED ORDER — MYCOPHENOLATE SODIUM 180 MG PO TBEC
180.0000 mg | DELAYED_RELEASE_TABLET | Freq: Every day | ORAL | Status: DC
  Administered 2024-04-07 – 2024-04-10 (×4): 180 mg via ORAL
  Filled 2024-04-07 (×5): qty 1

## 2024-04-07 MED ORDER — IPRATROPIUM-ALBUTEROL 0.5-2.5 (3) MG/3ML IN SOLN
3.0000 mL | Freq: Once | RESPIRATORY_TRACT | Status: AC
  Administered 2024-04-07: 3 mL via RESPIRATORY_TRACT
  Filled 2024-04-07: qty 3

## 2024-04-07 MED ORDER — TAMSULOSIN HCL 0.4 MG PO CAPS
0.4000 mg | ORAL_CAPSULE | Freq: Every day | ORAL | Status: DC
  Administered 2024-04-08 – 2024-04-11 (×4): 0.4 mg via ORAL
  Filled 2024-04-07 (×4): qty 1

## 2024-04-07 MED ORDER — MAGNESIUM SULFATE 2 GM/50ML IV SOLN
2.0000 g | INTRAVENOUS | Status: AC
  Administered 2024-04-07: 2 g via INTRAVENOUS
  Filled 2024-04-07: qty 50

## 2024-04-07 MED ORDER — METHYLPREDNISOLONE SODIUM SUCC 125 MG IJ SOLR
125.0000 mg | INTRAMUSCULAR | Status: AC
  Administered 2024-04-07: 125 mg via INTRAVENOUS
  Filled 2024-04-07: qty 2

## 2024-04-07 MED ORDER — ALBUTEROL SULFATE (2.5 MG/3ML) 0.083% IN NEBU
5.0000 mg | INHALATION_SOLUTION | Freq: Once | RESPIRATORY_TRACT | Status: AC
  Administered 2024-04-07: 5 mg via RESPIRATORY_TRACT
  Filled 2024-04-07: qty 6

## 2024-04-07 MED ORDER — FUROSEMIDE 10 MG/ML IJ SOLN
160.0000 mg | Freq: Once | INTRAVENOUS | Status: AC
  Administered 2024-04-07: 160 mg via INTRAVENOUS
  Filled 2024-04-07: qty 16

## 2024-04-07 MED ORDER — SODIUM CHLORIDE 0.9 % IV SOLN
1.0000 g | INTRAVENOUS | Status: DC
  Administered 2024-04-07 – 2024-04-08 (×2): 1 g via INTRAVENOUS
  Filled 2024-04-07 (×2): qty 10

## 2024-04-07 MED ORDER — SODIUM CHLORIDE 0.9% FLUSH
3.0000 mL | Freq: Two times a day (BID) | INTRAVENOUS | Status: DC
  Administered 2024-04-07 – 2024-04-12 (×11): 3 mL via INTRAVENOUS

## 2024-04-07 MED ORDER — ACETAMINOPHEN 650 MG RE SUPP: 650.0000 mg | Freq: Four times a day (QID) | RECTAL | Status: DC | PRN

## 2024-04-07 MED ORDER — INSULIN ASPART 100 UNIT/ML IJ SOLN
0.0000 [IU] | Freq: Three times a day (TID) | INTRAMUSCULAR | Status: DC
  Administered 2024-04-08 (×2): 2 [IU] via SUBCUTANEOUS
  Administered 2024-04-08: 3 [IU] via SUBCUTANEOUS
  Administered 2024-04-09 (×2): 2 [IU] via SUBCUTANEOUS
  Administered 2024-04-10 (×2): 1 [IU] via SUBCUTANEOUS
  Filled 2024-04-07: qty 2
  Filled 2024-04-07: qty 3
  Filled 2024-04-07: qty 1
  Filled 2024-04-07 (×3): qty 2
  Filled 2024-04-07: qty 1

## 2024-04-07 MED ORDER — APIXABAN 5 MG PO TABS
5.0000 mg | ORAL_TABLET | Freq: Two times a day (BID) | ORAL | Status: DC
  Administered 2024-04-07 – 2024-04-10 (×7): 5 mg via ORAL
  Filled 2024-04-07 (×7): qty 1

## 2024-04-07 MED ORDER — INSULIN ASPART 100 UNIT/ML IJ SOLN
0.0000 [IU] | Freq: Every day | INTRAMUSCULAR | Status: DC
  Administered 2024-04-08: 2 [IU] via SUBCUTANEOUS
  Filled 2024-04-07: qty 2

## 2024-04-07 NOTE — Hospital Course (Addendum)
 HPI per Dr. Fernand: [name]. is a 64 y.o. year old male with medical history of HTN, HLD, T2DM, CHF (EF 45%) and 09/2023, chronic atrial fibrillation, BPH, OSA, ESRD status post renal transplant presenting to the ED with worsening shortness of breath.  Patient has been having URI symptoms for about 10 days.  His symptoms include shortness of breath, coughing but patient denies any fevers or chills. States his breathing has worsened over the last 3 days. He states his medications were adjusted a few days ago.  He reports that he is holding onto fluid in his body and just needs lasix .  Denies any chest pain On arrival to the ED patient was noted to be HDS stable.  Lab work and imaging obtained.  CBC without leukocytosis, mild anemia that is macrocytic. Thrombocytopenia that appears to be chronic and stable.  CMP obtained that shows AKI on CKD with baseline creatinine around 2 and current creatinine at 4.75.  There is ALP elevation that appears to be chronic.  Troponin checked and elevated at 129.  proBNP greater than 35,000.  VBG obtained that shows 7.26/73/37.  Respiratory panel negative for COVID, flu, RSV.  Chest x-ray shows volume overload.  Patient started on IV Lasix  and [facility] consulted for admission.  Discussed with Dr. Floy regarding patient's AKI on transplanted kidney and that he may need to be transferred to his transplant center.  Discussed case with nephrologist Dr. Lazarus who was in agreement.  EDP initiated transfer but stated patient was on a wait list so unlikely that patient will be transferred today, EDP reach out to nephrologist on-call who stated patient can be admitted at [name] ([facility]  Given this, [facility] contacted for admission.  Acute Heart Failure Exacerbation Patient lab work, exam and imaging consistent with volume overload.  Patient given IV Lasix  for diuresis.  Strict ins and outs obtained.  Echo ordered.   Pt with AKI. Baseline creatinine is 2.0.  Current creatinine 4.75.   Patient's case is complicated by transplanted kidney.  Suspicion for AKI secondary to venous congestion but other etiologies not ruled out.  Diuresed as above.  Plan was to transfer to [facility] given his transplant team's care.  Renal ultrasound and urine studies obtained.  Nephrology consulted.   BPH: Continued home Flomax  and bladder scans every shift   Atrial fibrillation: Patient's Eliquis  restarted and he was noted to be in sinus rhythm.    MDD: Continued home Wellbutrin      Type 2 diabetes: Holding home Farxiga  and gabapentin  in setting of AKI.  Can resume outpatient Mounjaro  after discharge.  Patient was started on SSI.   Hypertension: Holding home losartan  and hydralazine  as patient is getting IV diuresis.  Can restart hydralazine  but will hold losartan  until AKI resolves.   GERD: Held home PPI in case of intrinsic kidney disease.   Hyperlipidemia: Continued home statin   Iron  deficiency anemia: Patient gets iron  transfusions.  Hemoglobin above baseline.

## 2024-04-07 NOTE — ED Notes (Signed)
 Patient is being discharged from the Urgent Care and sent to the Emergency Department via EMS . Per Burnard Cork NP, patient is in need of higher level of care due to hypoxia. Patient is aware and verbalizes understanding of plan of care.  Vitals:   04/07/24 1316 04/07/24 1317  BP:    Pulse:    Resp:    Temp:    SpO2: (!) 83% 96%

## 2024-04-07 NOTE — ED Triage Notes (Addendum)
 Pt has been SOB and breathing difficulty for the last 10 days and feeling off balance. EMS sts that pt is normally on RA however pt has been placed on 6l/min via [place].

## 2024-04-07 NOTE — ED Triage Notes (Addendum)
 Patient to Urgent Care with wife, complaints of  shortness of breath/ feeling off balance and having visual hallucinations. Dark urine.   Symptoms x10 days. Developed SHOB  and weakness today. Last breathing treatment yesterday.   RA spo2 at 74%. COPD- regular RA spo2 upper 90's.

## 2024-04-07 NOTE — ED Notes (Signed)
 Called CCMD to add pt to monitoring.

## 2024-04-07 NOTE — ED Notes (Signed)
 CO2 73, MD Viviann made aware.

## 2024-04-07 NOTE — H&P (Signed)
 " History and Physical    [name]. FMW:[id number] DOB: [birth year] DOA: 04/07/2024  DOS: the patient was seen and examined on 04/07/2024  PCP: [name], MD   Patient coming from: Home  I have personally briefly reviewed patient's old medical records in [facility] and CareEverywhere  HPI:   [name]. is a 64 y.o. year old male with medical history of HTN, HLD, T2DM, CHF (EF 45%) and 09/2023, chronic atrial fibrillation, BPH, OSA, ESRD status post renal transplant presenting to the ED with worsening shortness of breath.  Patient has been having URI symptoms for about 10 days.  His symptoms include shortness of breath, coughing but patient denies any fevers or chills. States his breathing has worsened over the last 3 days. He states his medications were adjusted a few days ago.  He reports that he is holding onto fluid in his body and just needs lasix .  Denies any chest pain On arrival to the ED patient was noted to be HDS stable.  Lab work and imaging obtained.  CBC without leukocytosis, mild anemia that is macrocytic. Thrombocytopenia that appears to be chronic and stable.  CMP obtained that shows AKI on CKD with baseline creatinine around 2 and current creatinine at 4.75.  There is ALP elevation that appears to be chronic.  Troponin checked and elevated at 129.  proBNP greater than 35,000.  VBG obtained that shows 7.26/73/37.  Respiratory panel negative for COVID, flu, RSV.  Chest x-ray shows volume overload.  Patient started on IV Lasix  and TRH consulted for admission.  Discussed with Dr. Floy regarding patient's AKI on transplanted kidney and that he may need to be transferred to his transplant center.  Discussed case with nephrologist Dr. Lazarus who was in agreement.  EDP initiated transfer but stated patient was on a wait list so unlikely that patient will be transferred today, EDP reach out to nephrologist on-call who stated patient can be admitted at  [facility].  Given this, TRH contacted for admission.  Review of Systems: As mentioned in the history of present illness. All other systems reviewed and are negative.   Past Medical History:  Diagnosis Date   Anal condyloma    Anemia    Anxiety    Aortic atherosclerosis    Aortic stenosis    a.) TTE 10/2021: Mod AS. AVA 1.06cm^2 (VTI). Mean grad ; b.) TTE 04/02/2022: no AV stenosis   Asthma, persistent controlled 02/25/2013   Attention or concentration deficit 04/26/2021   BPH with obstruction/lower urinary tract symptoms 09/25/2014   CAD (coronary artery disease) 2005   a.) LHC 1996 -> HG stenosis mLAD -> PTCA/PCI with stent x 1 (unk type) -> complicated by CFA pseudoanurysm (required surg);  b.) LHC 08/10/2002  -> 95% LAD, 70% ISR LAD, 80% pOM, 70% mRCA --> CVTS consult; c.) 4v CABG 08/13/2002; c.) 03/2018 MV: Small, fixed inferoapical and apical sep defect. No isc. EF 47% (60-65% by 05/2018 TTE); d.) 02/2021 MV: small Apical inf fixed defect. No isc -> low risk.   Cardiomegaly    Cellulitis and abscess of left leg 07/22/2020   Chronic atrial fibrillation    a.) CHA2DS2VASc = 4 (CHF, HTN, vascular disease history, T2DM);  b.) rate/rhythm maintained without pharmacological intervention; chronically anticoagulated with apixaban    Chronic combined systolic and diastolic CHF (congestive heart failure)    a.) TTE 7/18 : EF 45-50%; b.) TTE 05/2018 : EF 60-65%, RVSP 74.8; c.) TTE 12/2018: EF 50-55%. Sev dil  LA; d.) TTE 04/2019: EF 45-50%; e.) TTE 07/2021: EF 40-45%, G3DD; f.) TTE 10/2021: EF 40-45%, mild conc LVH, septal-lat dyssynchrony (LBBB), mildly red RVSF, mild-mod dil LA, mildly dil RA, mild-mod MS, mod AS; g.) TTE 04/02/2022: EF 40-45%, glob HK, LVH, red RVSF, RVE, sev LAE, mild-mod RAE, mild MR   Chronic venous stasis dermatitis of both lower extremities    Complicated UTI (urinary tract infection) 06/24/2022   Cytomegaloviral disease 2017   Diverticulosis of colon 04/22/2013   Elevated  RIGHT hemidiaphragm    Erectile dysfunction    a.) on topical TRT + Trimix (alprostadil/papaverine/phentolamine) injections   ESRD (end stage renal disease) (HCC)    a.) dialysis dependent 2004-2012; b.) s/p cadaveric RIGHT renal transplant 09/13/2010   Essential hypertension 12/17/2010   Gout    History of 2019 novel coronavirus disease (COVID-19) 06/30/2019   History of bilateral cataract extraction 10/2017   History of renal transplant 09/13/2010   a.) s/p cadaveric donor transplant 09/13/2010   Hx of bilateral cataract extraction 10/2017   Hyperlipidemia    Hypogonadism in male 09/25/2014   Ischemic cardiomyopathy    Left cephalic vein thrombosis 06/2019   Long term current use of anticoagulant    a.) apixaban    Long term current use of immunosuppressive drug    a.) mycophenolate  + prednisone  + tacrolimus    Major depressive disorder 10/04/2013   Mitral stenosis 11/07/2021   a.) TTE 11/07/2021: severe MAC, mild-mod MS (MPG 6 mmHg); b.) TTE 04/02/2022: no MV stenosis   Murmur    Obesity hypoventilation syndrome 03/31/2012   OSA treated with BiPAP 09/29/2012   PAH (pulmonary artery hypertension)    a. 04/2019 RHC: RA 19, RV 80/20, PA 78/31 (51), PCWP 25, CO/CI 7.69/3.26. PVR 3.25 --> Sev PAH, likely primarily PV HTN; b.) RHC 04/04/2022: mRA 13, mPA 40, mPCWP 20, PA sat 59, AO sat 92, CO 7.63, CI 3.26, PVR 2.6 --> mod portal venous HTN   Pancreatitis    S/P CABG x 4 08/13/2002   a.) LIMA-LAD, LRA-OM, SVG-D1, SVG-dRCA   S/P gastric bypass    Synovitis of finger 06/11/2019   Trigger finger, unspecified little finger 12/23/2018   Type 2 diabetes mellitus with hyperglycemia, with long-term current use of insulin  09/09/2014   a.) has FreeStyle Libre CGM   Ulcer    Left shin goes to wound care center at [facility] qweek   Wears glasses    Wears hearing aid in both ears     Past Surgical History:  Procedure Laterality Date   CARDIAC CATHETERIZATION N/A 08/10/2002   CATARACT  EXTRACTION W/PHACO Right 10/29/2017   Procedure: CATARACT EXTRACTION PHACO AND INTRAOCULAR LENS PLACEMENT (IOC)  RIGHT DIABETIC;  Surgeon: Mittie Gaskin, MD;  Location: [facility];  Service: Ophthalmology;  Laterality: Right   CATARACT EXTRACTION W/PHACO Left 11/18/2017   Procedure: CATARACT EXTRACTION PHACO AND INTRAOCULAR LENS PLACEMENT (IOC) LEFT IVA/TOPICAL;  Surgeon: Mittie Gaskin, MD;  Location: [facility];  Service: Ophthalmology;  Laterality: Left   COLONOSCOPY WITH PROPOFOL  N/A 04/22/2013   Procedure: COLONOSCOPY WITH PROPOFOL ;  Surgeon: Toribio SHAUNNA Cedar, MD;  Location: [facility];  Service: Endoscopy;  Laterality: N/A;   CORONARY ANGIOPLASTY WITH STENT PLACEMENT N/A 1996   CORONARY ARTERY BYPASS GRAFT N/A 08/13/2002   Procedure: 4v CORONARY ARTERY BYPASS GRAFTING; Location: [facility]; Surgeon: Dessa Laine, MD   CYSTOSCOPY WITH INSERTION OF UROLIFT N/A 12/25/2021   Procedure: CYSTOSCOPY WITH INSERTION OF UROLIFT;  Surgeon: Twylla Glendia BROCKS, MD;  Location:  [facility];  Service: Urology;  Laterality: N/A;   DG ANGIO AV SHUNT*L*     right and left upper arms   FASCIOTOMY  03/03/2012   Procedure: FASCIOTOMY;  Surgeon: Arley JONELLE Curia, MD;  Location: [facility];  Service: Orthopedics;  Laterality: Right;  FASCIOTOMY RIGHT SMALL FINGER   FASCIOTOMY Left 08/17/2013   Procedure: FASCIOTOMY LEFT RING;  Surgeon: Arley JONELLE Curia, MD;  Location: [facility];  Service: Orthopedics;  Laterality: Left;   INCISION AND DRAINAGE ABSCESS Left 10/15/2015   Procedure: INCISION AND DRAINAGE ABSCESS;  Surgeon: Laneta JULIANNA Luna, MD;  Location: [facility];  Service: General;  Laterality: Left;   KIDNEY TRANSPLANT  09/13/2010   cadaver--at [facility]   LASER ABLATION CONDOLAMATA N/A 01/08/2023   Procedure: LASER REMOVAL ABLATION OF CONDYLOMATA;  Surgeon: Sheldon Standing, MD;  Location: [facility];  Service: General;  Laterality: N/A;  GEN AND LOCAL   LASER ABLATION  CONDOLAMATA N/A 11/20/2023   Procedure: ABLATION, CONDYLOMA, USING LASER HEMORRHOIDAL LIGATION & HEMORRHOIDOPEXY, HEMORRHOIDECTOMY x 2;  Surgeon: Sheldon Standing, MD;  Location: [facility];  Service: General;  Laterality: N/A;  LASER REMOVAL ABLATION OF CONDYLOMATA ANORECTAL EXAMINATION UNDER ANESTHESIA   RECTAL EXAM UNDER ANESTHESIA N/A 01/08/2023   Procedure: ANORECTAL EXAM UNDER ANESTHESIA;  Surgeon: Sheldon Standing, MD;  Location: [facility];  Service: General;  Laterality: N/A;   RECTAL EXAM UNDER ANESTHESIA N/A 11/20/2023   Procedure: EXAM UNDER ANESTHESIA, RECTUM;  Surgeon: Sheldon Standing, MD;  Location: [facility];  Service: General;  Laterality: N/A;   RIGHT HEART CATH N/A 11/15/2016   Procedure: RIGHT HEART CATH;  Surgeon: Darron Deatrice LABOR, MD;  Location: [facility];  Service: Cardiovascular;  Laterality: N/A;   RIGHT HEART CATH N/A 05/03/2019   Procedure: RIGHT HEART CATH;  Surgeon: [name], MD;  Location: [facility] CV [facility];  Service: Cardiovascular;  Laterality: N/A;   RIGHT HEART CATH N/A 04/04/2022   Procedure: RIGHT HEART CATH;  Surgeon: [name], MD;  Location: [facility] CV LAB;  Service: Cardiovascular;  Laterality: N/A;   ROUX-EN-Y GASTRIC BYPASS N/A 2015   TYMPANIC MEMBRANE REPAIR Left 03/2010   VASECTOMY     WART FULGURATION N/A 05/31/2022   Procedure: FULGURATION ANAL WART;  Surgeon: Tye Millet, DO;  Location: [facility];  Service: General;  Laterality: N/A;     Allergies[1]  Family History  Problem Relation Age of Onset   Heart disease Father    Kidney failure Father    Kidney disease Father    Diabetes Maternal Grandmother    Breast cancer Maternal Grandmother    Valvular heart disease Mother    Liver cancer Paternal Uncle    Liver cancer Paternal Grandmother    Prostate cancer Neg Hx     Prior to Admission medications  Medication Sig Start Date End Date Taking? Authorizing Provider  albuterol  (PROVENTIL ) (2.5 MG/3ML) 0.083% nebulizer solution  Take 3 mLs (2.5 mg total) by nebulization every 6 (six) hours as needed for wheezing or shortness of breath. 04/05/24   [name], MD  albuterol  (VENTOLIN  HFA) 108 [phone] Base) MCG/ACT inhaler Inhale 2 puffs into the lungs every 6 (six) hours as needed for wheezing or shortness of breath. 11/18/22   [name], MD  aluminum -magnesium  hydroxide-simethicone  (MAALOX) 200-200-20 MG/5ML SUSP Take 30 mLs by mouth 4 (four) times daily -  before meals and at bedtime. 01/26/24   Clarine Ozell LABOR, MD  amoxicillin  (AMOXIL ) 500 MG capsule  Take 4 capsules (2,000) 30-60 min prior to dental appointment. Patient not taking: Reported on 02/17/2024 12/12/23     apixaban  (ELIQUIS ) 5 MG TABS tablet Take 1 tablet (5 mg total) by mouth 2 (two) times daily. 11/30/23 11/29/24  Laurita Pillion, MD  apixaban  (ELIQUIS ) 5 MG TABS tablet Take 1 tablet (5 mg total) by mouth 2 (two) times daily. 02/23/24 02/22/25  [name], MD  buPROPion  (WELLBUTRIN  SR) 150 MG 12 hr tablet Take 1 tablet (150 mg total) by mouth 2 (two) times daily. 05/14/23 06/10/24  [name], MD  colchicine  0.6 MG tablet Take 1 tablet (0.6 mg total) by mouth daily. 01/23/24     Continuous Glucose Sensor (FREESTYLE LIBRE 3 PLUS SENSOR) MISC Use 1 each every 15 (fifteen) days 12/12/23     Cyanocobalamin  (B-12 PO) Take 1 tablet by mouth daily.    [provider]  dapagliflozin  propanediol (FARXIGA ) 10 MG TABS tablet Take 1 tablet (10 mg total) by mouth daily before breakfast. 12/31/23   [name], MD  febuxostat  (ULORIC ) 40 MG tablet Take 3 tablets (120 mg total) by mouth daily. 03/04/24     fluticasone  (FLONASE ) 50 MCG/ACT nasal spray Place 2 sprays into both nostrils daily. 09/09/23   [name], MD  gabapentin  (NEURONTIN ) 300 MG capsule Take 1 capsule (300 mg total) by mouth 2 (two) times daily. 05/16/23     insulin  glargine-yfgn (SEMGLEE ) 100 UNIT/ML Pen Inject 24 Units subcutaneously once daily. Patient not taking: Reported on  02/17/2024 12/15/23     Insulin  Pen Needle (TECHLITE PEN NEEDLES) 31G X 5 MM MISC 4 (four) times daily. 08/21/22   Brendia Calton Squires, PA-C  Insulin  Pen Needle (UNIFINE PENTIPS) 31G X 5 MM MISC Use 4 times daily as directed 05/28/21     lisinopril  (ZESTRIL ) 40 MG tablet Take 40 mg by mouth. 08/19/18   [provider]  metolazone  (ZAROXOLYN ) 2.5 MG tablet Take 1 tablet (2.5 mg total) by mouth as directed by the heart failure clinic. 01/01/24   [name], MD  montelukast  (SINGULAIR ) 10 MG tablet Take 1 tablet (10 mg total) by mouth at bedtime. 03/01/24 03/01/25  [name], MD  mycophenolate  (MYFORTIC ) 180 MG EC tablet Take 2 tablets (360 mg total) by mouth every morning AND 1 tablet (180 mg total) every evening. 04/21/23     mycophenolate  (MYFORTIC ) 180 MG EC tablet Take 1 tablet (180 mg total) by mouth 2 (two) times daily. 03/05/24     omeprazole  (PRILOSEC) 20 MG capsule Take 1 capsule (20 mg total) by mouth daily. 03/12/24 03/12/25  [name], MD  oxyCODONE -acetaminophen  (PERCOCET) 10-325 MG tablet Take 1 tablet by mouth every 6 (six) hours as needed for up to 5 days. Patient not taking: Reported on 02/17/2024 12/02/23     oxyCODONE -acetaminophen  (PERCOCET) 10-325 MG tablet Take 1 tablet by mouth every 6 (six) hours as needed for pain. 03/16/24     predniSONE  (DELTASONE ) 5 MG tablet Take 1 tablet (5 mg total) by mouth daily. 03/05/24     rosuvastatin  (CRESTOR ) 10 MG tablet Take 1 tablet (10 mg total) by mouth daily. 10/30/23   [name], MD  sodium chloride  (OCEAN) 0.65 % SOLN nasal spray Place 2 sprays into both nostrils every 8 (eight) hours for 10 days. 09/29/23 10/23/24  Franchot Novel, MD  sulfamethoxazole -trimethoprim  (BACTRIM ) 400-80 MG tablet Take 1 tablet by mouth 3 (three) times a week. 03/01/24     tacrolimus  (PROGRAF ) 1 MG capsule  Take 1 mg by mouth. 12/19/23   [provider]  tacrolimus  (PROGRAF ) 1 MG capsule Take 2 capsules (2 mg total)  by mouth every morning AND 1 capsule (1 mg total) every evening. 02/23/24     tacrolimus  (PROGRAF ) 1 MG capsule Take 2 capsules (2 mg total) by mouth in the morning AND 1 capsule (1 mg total) at bedtime. 03/05/24     tadalafil  (CIALIS ) 20 MG tablet Take 0.5-1 tablets (10-20 mg total) by mouth every other day as needed 01/14/24   [name], MD  tamsulosin  (FLOMAX ) 0.4 MG CAPS capsule Take 1 capsule (0.4 mg total) by mouth daily. 01/16/23   Vaillancourt, Samantha, PA-C  Testosterone  Enanthate (XYOSTED ) 75 MG/0.5ML SOAJ Inject 0.5 mLs into the skin once a week. 12/22/23   Vaillancourt, Samantha, PA-C  tirzepatide  (MOUNJARO ) 5 MG/0.5ML Pen Inject 5 mg into the skin every 7 (seven) days. 03/12/24     torsemide  (DEMADEX ) 100 MG tablet Take 1 tablet (100 mg total) by mouth 2 (two) times daily. 01/14/24   [name], MD  traMADol  (ULTRAM ) 50 MG tablet Take 1 tablet (50 mg total) by mouth every 6 (six) hours as needed for pain. 01/14/24     triamcinolone  (KENALOG ) 0.025 % cream Apply 1-2 Applications topically daily to sandpaper areas on legs. 01/27/24     Zinc  50 MG TABS Take 1 tablet (50 mg total) by mouth daily. 04/21/23   [name], MD    Social History:  reports that he has never smoked. He has never used smokeless tobacco. He reports that he does not currently use alcohol. He reports that he does not use drugs.   Physical Exam: Vitals:   04/07/24 1800 04/07/24 1815 04/07/24 1821 04/07/24 1823  BP:   122/72   Pulse: 83 85 76   Resp: 20 19 (!) 23   Temp:    98.4 F (36.9 C)  TempSrc:    Oral  SpO2: 90% (!) 89% 92%     Gen: somnolent but arousable HENT: Mertzon in place CV: RRR, palpable pedal pulse Lung: bibasilar rales Abd: No TTP, normal bowel sounds MSK: No asymmetry, good bulk and tone Skin: superficial ulcer noted on RLE Neuro: somnolent but oriented   Labs on Admission: I have personally reviewed following labs and imaging studies  CBC: Recent Labs  Lab  04/07/24 1408  WBC 4.5  NEUTROABS 3.6  HGB 11.3*  HCT 38.1*  MCV 102.7*  PLT 82*   Basic Metabolic Panel: Recent Labs  Lab 04/07/24 1408  NA 144  K 4.3  CL 104  CO2 25  GLUCOSE 121*  BUN 100*  CREATININE 4.75*  CALCIUM  8.9   GFR: CrCl cannot be calculated (Unknown ideal weight.). Liver Function Tests: Recent Labs  Lab 04/07/24 1408  AST 26  ALT 16  ALKPHOS 203*  BILITOT 0.8  PROT 7.3  ALBUMIN  4.1   No results for input(s): LIPASE, AMYLASE in the last 168 hours. No results for input(s): AMMONIA in the last 168 hours. Coagulation Profile: No results for input(s): INR, PROTIME in the last 168 hours. Cardiac Enzymes: No results for input(s): CKTOTAL, CKMB, CKMBINDEX, TROPONINI, TROPONINIHS in the last 168 hours. BNP (last 3 results) Recent Labs    01/26/24 1024 02/09/24 1036 02/17/24 1145  BNP 544.9* 538.3* 585.6*   HbA1C: No results for input(s): HGBA1C in the last 72 hours. CBG: No results for input(s): GLUCAP in the last 168 hours. Lipid Profile: No results for input(s): CHOL,  HDL, LDLCALC, TRIG, CHOLHDL, LDLDIRECT in the last 72 hours. Thyroid  Function Tests: No results for input(s): TSH, T4TOTAL, FREET4, T3FREE, THYROIDAB in the last 72 hours. Anemia Panel: No results for input(s): VITAMINB12, FOLATE, FERRITIN, TIBC, IRON , RETICCTPCT in the last 72 hours. Urine analysis:    Component Value Date/Time   COLORURINE YELLOW (A) 11/23/2023 0530   APPEARANCEUR Clear 12/22/2023 0940   LABSPEC 1.009 11/23/2023 0530   PHURINE 5.0 11/23/2023 0530   GLUCOSEU 2+ (A) 12/22/2023 0940   HGBUR LARGE (A) 11/23/2023 0530   BILIRUBINUR Negative 12/22/2023 0940   KETONESUR NEGATIVE 11/23/2023 0530   PROTEINUR Negative 12/22/2023 0940   PROTEINUR NEGATIVE 11/23/2023 0530   NITRITE Negative 12/22/2023 0940   NITRITE NEGATIVE 11/23/2023 0530   LEUKOCYTESUR Negative 12/22/2023 0940   LEUKOCYTESUR NEGATIVE  11/23/2023 0530    Radiological Exams on Admission: I have personally reviewed images DG Chest Portable 1 View Result Date: 04/07/2024 CLINICAL DATA:  Short of breath, cough EXAM: PORTABLE CHEST 1 VIEW COMPARISON:  01/26/2024 FINDINGS: Single frontal view of the chest demonstrates postsurgical changes from CABG. Cardiac silhouette remains enlarged. There is increased pulmonary vascular congestion with patchy bilateral airspace disease greatest at the left lung base. Trace bilateral pleural effusions. No pneumothorax. IMPRESSION: 1. Congestive heart failure, with worsening volume status since prior study. Electronically Signed   By: Ozell Daring M.D.   On: 04/07/2024 14:56    EKG: My personal interpretation of EKG shows: pending  Assessment/Plan Principal Problem:   Acute hypoxic respiratory failure (HCC) Active Problems:   Chronic heart failure with mildly reduced ejection fraction (HFmrEF, 41-49%) (HCC)   Hyperlipidemia associated with type 2 diabetes mellitus (HCC)   Chronic atrial fibrillation (HCC)   PAH (pulmonary artery hypertension) (HCC)   Essential hypertension   OSA (obstructive sleep apnea)   Asthma, persistent controlled   BPH (benign prostatic hyperplasia)   Type II diabetes mellitus (HCC)   GERD (gastroesophageal reflux disease)   Obesity, Class III, BMI 40-49.9 (morbid obesity) (HCC)   Acute Heart Failure Exacerbation Pt with hx of CHF and signs of volume overload on exam and imaging suggestive of CHF exacerbation.  - Admit to progressive LOC - Start IV Furosemide  80 mg BID as pt is on 100 mg torsemide  BID - Trend BMP, follow mag (goal K>4 and Mag>2) - Strict I&Os - Daily Weights - Fluid restriction to 2 L, limit salt intake.   Pt with AKI. Baseline creatinine is 2.0.  Current creatinine 4.75.  Patient's case is complicated by transplanted kidney.  Suspect it is secondary to venous congestion as patient is volume overloaded but other etiologies including intrinsic  etiology are possibilities.  Discussed with EDP that patient will be best served at transplant center and he is on the wait list for transfer to his transplant center. Urine studies and imaging including bladder scan and renal ultrasound ordered.  Will trend renal function every 8 hours and will consult nephrology for their assistance.  Will closely monitor urine output.  Will continue his immunosuppressants.  Will reach out to nephrology and pharmacy to assist with dosing in setting of AKI.  BPH: Continue home Flomax  and bladder scans every shift  Atrial fibrillation: Patient appears to be in sinus rhythm.  Continue home Eliquis , per cardiology no nodal blockade given history of bradycardia.  MDD: Continue home Wellbutrin    Type 2 diabetes: Holding home Farxiga  and gabapentin  in setting of AKI.  Can resume outpatient Mounjaro  after discharge.  Hypertension: Holding home losartan  and  hydralazine  as patient is getting IV diuresis.  Can restart hydralazine  but will hold losartan  until AKI resolves.  GERD: Continue home PPI  Hyperlipidemia: Continue home statin  Iron  deficiency anemia: Patient gets iron  transfusions.  Hemoglobin above baseline.  VTE prophylaxis:  Eliquis   Diet: HH Code Status:  Full Code Telemetry:  Admission status: Observation, Progressive Patient is from: Home Anticipated d/c is to: Home Anticipated d/c is in: 1-2 days   Family Communication: Updated at bedside  Consults called: Nephrology, cardiology   Severity of Illness: The appropriate patient status for this patient is OBSERVATION. Observation status is judged to be reasonable and necessary in order to provide the required intensity of service to ensure the patient's safety. The patient's presenting symptoms, physical exam findings, and initial radiographic and laboratory data in the context of their medical condition is felt to place them at decreased risk for further clinical deterioration. Furthermore, it  is anticipated that the patient will be medically stable for discharge from the hospital within 2 midnights of admission.    Morene Bathe, MD [facility]     [1]  Allergies Allergen Reactions   Iodinated Contrast Media Other (See Comments)    Kidney transplant  Iodinated contrast media (substance)   Iodine Other (See Comments)    Kidney transplant   Nsaids Other (See Comments)    Kidney transplant  Kidney transplant    Non-steroidal anti-inflammatory agent (substance)   Other Other (See Comments)    Non-steroidal anti-inflammatory agent (substance)   Ibuprofen Other (See Comments)    Due to kidney transplant   "

## 2024-04-07 NOTE — ED Notes (Signed)
 [facility] for transfer per Dr. Floy Md

## 2024-04-07 NOTE — ED Provider Notes (Signed)
 " [facility]    CSN: [id number] Arrival date & time: 04/07/24  1200      History   Chief Complaint Chief Complaint  Patient presents with   Nasal Congestion   Generalized Body Aches   Shortness of Breath    HPI [name]. is a 64 y.o. male.  Accompanied by his wife, patient presents with URI symptoms x 10 days.  Per wife, he is acutely short of breath, dizzy, visual hallucinations today.  His medical history includes COPD, heart failure, CABG, pulmonary hypertension, chronic A-fib, kidney transplant.  The history is provided by the spouse, the patient and medical records.    Past Medical History:  Diagnosis Date   Anal condyloma    Anemia    Anxiety    Aortic atherosclerosis    Aortic stenosis    a.) TTE 10/2021: Mod AS. AVA 1.06cm^2 (VTI). Mean grad ; b.) TTE 04/02/2022: no AV stenosis   Asthma, persistent controlled 02/25/2013   Attention or concentration deficit 04/26/2021   BPH with obstruction/lower urinary tract symptoms 09/25/2014   CAD (coronary artery disease) 2005   a.) LHC 1996 -> HG stenosis mLAD -> PTCA/PCI with stent x 1 (unk type) -> complicated by CFA pseudoanurysm (required surg);  b.) LHC 08/10/2002  -> 95% LAD, 70% ISR LAD, 80% pOM, 70% mRCA --> CVTS consult; c.) 4v CABG 08/13/2002; c.) 03/2018 MV: Small, fixed inferoapical and apical sep defect. No isc. EF 47% (60-65% by 05/2018 TTE); d.) 02/2021 MV: small Apical inf fixed defect. No isc -> low risk.   Cardiomegaly    Cellulitis and abscess of left leg 07/22/2020   Chronic atrial fibrillation    a.) CHA2DS2VASc = 4 (CHF, HTN, vascular disease history, T2DM);  b.) rate/rhythm maintained without pharmacological intervention; chronically anticoagulated with apixaban    Chronic combined systolic and diastolic CHF (congestive heart failure)    a.) TTE 7/18 : EF 45-50%; b.) TTE 05/2018 : EF 60-65%, RVSP 74.8; c.) TTE 12/2018: EF 50-55%. Sev dil LA; d.) TTE 04/2019: EF 45-50%; e.) TTE  07/2021: EF 40-45%, G3DD; f.) TTE 10/2021: EF 40-45%, mild conc LVH, septal-lat dyssynchrony (LBBB), mildly red RVSF, mild-mod dil LA, mildly dil RA, mild-mod MS, mod AS; g.) TTE 04/02/2022: EF 40-45%, glob HK, LVH, red RVSF, RVE, sev LAE, mild-mod RAE, mild MR   Chronic venous stasis dermatitis of both lower extremities    Complicated UTI (urinary tract infection) 06/24/2022   Cytomegaloviral disease 2017   Diverticulosis of colon 04/22/2013   Elevated RIGHT hemidiaphragm    Erectile dysfunction    a.) on topical TRT + Trimix (alprostadil/papaverine/phentolamine) injections   ESRD (end stage renal disease) (HCC)    a.) dialysis dependent 2004-2012; b.) s/p cadaveric RIGHT renal transplant 09/13/2010   Essential hypertension 12/17/2010   Gout    History of 2019 novel coronavirus disease (COVID-19) 06/30/2019   History of bilateral cataract extraction 10/2017   History of renal transplant 09/13/2010   a.) s/p cadaveric donor transplant 09/13/2010   Hx of bilateral cataract extraction 10/2017   Hyperlipidemia    Hypogonadism in male 09/25/2014   Ischemic cardiomyopathy    Left cephalic vein thrombosis 06/2019   Long term current use of anticoagulant    a.) apixaban    Long term current use of immunosuppressive drug    a.) mycophenolate  + prednisone  + tacrolimus    Major depressive disorder 10/04/2013   Mitral stenosis 11/07/2021   a.) TTE 11/07/2021: severe MAC, mild-mod MS (MPG 6 mmHg);  b.) TTE 04/02/2022: no MV stenosis   Murmur    Obesity hypoventilation syndrome 03/31/2012   OSA treated with BiPAP 09/29/2012   PAH (pulmonary artery hypertension)    a. 04/2019 RHC: RA 19, RV 80/20, PA 78/31 (51), PCWP 25, CO/CI 7.69/3.26. PVR 3.25 --> Sev PAH, likely primarily PV HTN; b.) RHC 04/04/2022: mRA 13, mPA 40, mPCWP 20, PA sat 59, AO sat 92, CO 7.63, CI 3.26, PVR 2.6 --> mod portal venous HTN   Pancreatitis    S/P CABG x 4 08/13/2002   a.) LIMA-LAD, LRA-OM, SVG-D1, SVG-dRCA   S/P gastric  bypass    Synovitis of finger 06/11/2019   Trigger finger, unspecified little finger 12/23/2018   Type 2 diabetes mellitus with hyperglycemia, with long-term current use of insulin  09/09/2014   a.) has FreeStyle Libre CGM   Ulcer    Left shin goes to wound care center at [facility] qweek   Wears glasses    Wears hearing aid in both ears     Patient Active Problem List   Diagnosis Date Noted   Ulcer of lower extremity, limited to breakdown of skin (HCC) 12/11/2023   Inflammatory polyarthropathy (HCC) 12/11/2023   Acute meniscal tear, medial, left, initial encounter 12/11/2023   Gross hematuria 12/11/2023   Fracture of left shoulder 09/12/2023   Thrombocytopenia 04/08/2023   Chronic kidney disease, stage 3b (HCC) 02/04/2023   History of Bell's palsy with chronic left facial droop 02/03/2023   Anal condyloma 11/26/2022   Diastasis recti 11/26/2022   Hx of adenomatous colonic polyps 11/26/2022   Asthmatic bronchitis with acute exacerbation 11/18/2022   Iron  deficiency anemia 08/26/2022   Chronic anticoagulation 07/07/2022   Ischemic cardiomyopathy 06/22/2022   Anemia due to stage 3b chronic kidney disease (HCC) 03/04/2022   Internal hemorrhoids 01/21/2022   Chronic atrial fibrillation (HCC)    PAH (pulmonary artery hypertension) (HCC)    Type II diabetes mellitus with renal manifestations (HCC)    Severe obesity (BMI 35.0-39.9) with comorbidity (HCC)    Encounter for general adult medical examination with abnormal findings 10/30/2021   Mood disorder 10/30/2021   Dupuytren's disease of finger with contracture 05/29/2021   GERD (gastroesophageal reflux disease) 04/26/2021   Long-term use of immunosuppressant medication 05/11/2020   Erectile dysfunction due to arterial insufficiency 10/20/2019   Pulmonary hypertension due to left heart disease (HCC) 01/20/2019   Chronic heart failure with mildly reduced ejection fraction (HFmrEF, 41-49%) (HCC) 11/19/2016   Chronic diastolic heart  failure (HCC) 91/71/7981   Chronic venous insufficiency 06/05/2016   Proteinuria 06/23/2015   BPH (benign prostatic hyperplasia) 09/25/2014   Hypogonadism in male 09/25/2014   Immunosuppressed status 09/09/2014   H/O gastric bypass 04/25/2014   Benign neoplasm of colon 04/22/2013   Diverticulosis of colon 04/22/2013   Asthma, persistent controlled 02/25/2013   OSA (obstructive sleep apnea) 09/29/2012   Gout 09/13/2012   Essential hypertension 12/17/2010   Hyperlipidemia associated with type 2 diabetes mellitus (HCC)    Renal transplant, status post 2012   CAD s/p CABG 2005 2005    Past Surgical History:  Procedure Laterality Date   CARDIAC CATHETERIZATION N/A 08/10/2002   CATARACT EXTRACTION W/PHACO Right 10/29/2017   Procedure: CATARACT EXTRACTION PHACO AND INTRAOCULAR LENS PLACEMENT (IOC)  RIGHT DIABETIC;  Surgeon: Mittie Gaskin, MD;  [facility];  Service: Ophthalmology;  Laterality: Right   CATARACT EXTRACTION W/PHACO Left 11/18/2017   Procedure: CATARACT EXTRACTION PHACO AND INTRAOCULAR LENS PLACEMENT (IOC) LEFT IVA/TOPICAL;  Surgeon: Mittie Gaskin,  MD;  Location: [facility];  Service: Ophthalmology;  Laterality: Left   COLONOSCOPY WITH PROPOFOL  N/A 04/22/2013   Procedure: COLONOSCOPY WITH PROPOFOL ;  Surgeon: Toribio SHAUNNA Cedar, MD;  Location: [facility];  Service: Endoscopy;  Laterality: N/A;   CORONARY ANGIOPLASTY WITH STENT PLACEMENT N/A 1996   CORONARY ARTERY BYPASS GRAFT N/A 08/13/2002   Procedure: 4v CORONARY ARTERY BYPASS GRAFTING; Location: [facility]; Surgeon: Dessa Laine, MD   CYSTOSCOPY WITH INSERTION OF UROLIFT N/A 12/25/2021   Procedure: CYSTOSCOPY WITH INSERTION OF UROLIFT;  Surgeon: Twylla Glendia BROCKS, MD;  Location: [facility];  Service: Urology;  Laterality: N/A;   DG ANGIO AV SHUNT*L*     right and left upper arms   FASCIOTOMY  03/03/2012   Procedure: FASCIOTOMY;  Surgeon: Arley JONELLE Curia, MD;  Location: [facility];  Service: Orthopedics;  Laterality: Right;  FASCIOTOMY RIGHT SMALL FINGER   FASCIOTOMY Left 08/17/2013   Procedure: FASCIOTOMY LEFT RING;  Surgeon: Arley JONELLE Curia, MD;  Location: [facility];  Service: Orthopedics;  Laterality: Left;   INCISION AND DRAINAGE ABSCESS Left 10/15/2015   Procedure: INCISION AND DRAINAGE ABSCESS;  Surgeon: Laneta JULIANNA Luna, MD;  Location: [facility];  Service: General;  Laterality: Left;   KIDNEY TRANSPLANT  09/13/2010   cadaver--at [facility]   LASER ABLATION CONDOLAMATA N/A 01/08/2023   Procedure: LASER REMOVAL ABLATION OF CONDYLOMATA;  Surgeon: Sheldon Standing, MD;  Location: [facility];  Service: General;  Laterality: N/A;  GEN AND LOCAL   LASER ABLATION CONDOLAMATA N/A 11/20/2023   Procedure: ABLATION, CONDYLOMA, USING LASER HEMORRHOIDAL LIGATION & HEMORRHOIDOPEXY, HEMORRHOIDECTOMY x 2;  Surgeon: Sheldon Standing, MD;  Location: [facility];  Service: General;  Laterality: N/A;  LASER REMOVAL ABLATION OF CONDYLOMATA ANORECTAL EXAMINATION UNDER ANESTHESIA   RECTAL EXAM UNDER ANESTHESIA N/A 01/08/2023   Procedure: ANORECTAL EXAM UNDER ANESTHESIA;  Surgeon: Sheldon Standing, MD;  Location: [facility];  Service: General;  Laterality: N/A;   RECTAL EXAM UNDER ANESTHESIA N/A 11/20/2023   Procedure: EXAM UNDER ANESTHESIA, RECTUM;  Surgeon: Sheldon Standing, MD;  Location: [facility];  Service: General;  Laterality: N/A;   RIGHT HEART CATH N/A 11/15/2016   Procedure: RIGHT HEART CATH;  Surgeon: Darron Deatrice LABOR, MD;  Location: [facility];  Service: Cardiovascular;  Laterality: N/A;   RIGHT HEART CATH N/A 05/03/2019   Procedure: RIGHT HEART CATH;  Surgeon: [name], MD;  Location: [facility];  Service: Cardiovascular;  Laterality: N/A;   RIGHT HEART CATH N/A 04/04/2022   Procedure: RIGHT HEART CATH;  Surgeon: [name], MD;  Location: [facility];  Service: Cardiovascular;  Laterality: N/A;   ROUX-EN-Y GASTRIC BYPASS N/A 2015   TYMPANIC MEMBRANE  REPAIR Left 03/2010   VASECTOMY     WART FULGURATION N/A 05/31/2022   Procedure: FULGURATION ANAL WART;  Surgeon: Tye Millet, DO;  Location: [facility];  Service: General;  Laterality: N/A;       Home Medications    Prior to Admission medications  Medication Sig Start Date End Date Taking? Authorizing Provider  albuterol  (PROVENTIL ) (2.5 MG/3ML) 0.083% nebulizer solution Take 3 mLs (2.5 mg total) by nebulization every 6 (six) hours as needed for wheezing or shortness of breath. 04/05/24   [name], MD  albuterol  (VENTOLIN  HFA) 108 [phone] Base) MCG/ACT inhaler Inhale 2 puffs into the lungs every 6 (six) hours as needed for wheezing or shortness of breath. 11/18/22   [name], MD  aluminum -magnesium  hydroxide-simethicone  (  MAALOX) 200-200-20 MG/5ML SUSP Take 30 mLs by mouth 4 (four) times daily -  before meals and at bedtime. 01/26/24   Clarine Ozell LABOR, MD  amoxicillin  (AMOXIL ) 500 MG capsule Take 4 capsules (2,000) 30-60 min prior to dental appointment. Patient not taking: Reported on 02/17/2024 12/12/23     apixaban  (ELIQUIS ) 5 MG TABS tablet Take 1 tablet (5 mg total) by mouth 2 (two) times daily. 11/30/23 11/29/24  Laurita Pillion, MD  apixaban  (ELIQUIS ) 5 MG TABS tablet Take 1 tablet (5 mg total) by mouth 2 (two) times daily. 02/23/24 02/22/25  [name], MD  buPROPion  (WELLBUTRIN  SR) 150 MG 12 hr tablet Take 1 tablet (150 mg total) by mouth 2 (two) times daily. 05/14/23 06/10/24  [name], MD  colchicine  0.6 MG tablet Take 1 tablet (0.6 mg total) by mouth daily. 01/23/24     Continuous Glucose Sensor (FREESTYLE LIBRE 3 PLUS SENSOR) MISC Use 1 each every 15 (fifteen) days 12/12/23     Cyanocobalamin  (B-12 PO) Take 1 tablet by mouth daily.    [provider]  dapagliflozin  propanediol (FARXIGA ) 10 MG TABS tablet Take 1 tablet (10 mg total) by mouth daily before breakfast. 12/31/23   [name], MD  febuxostat  (ULORIC ) 40 MG tablet Take 3 tablets (120 mg total) by  mouth daily. 03/04/24     fluticasone  (FLONASE ) 50 MCG/ACT nasal spray Place 2 sprays into both nostrils daily. 09/09/23   [name], MD  gabapentin  (NEURONTIN ) 300 MG capsule Take 1 capsule (300 mg total) by mouth 2 (two) times daily. 05/16/23     insulin  glargine-yfgn (SEMGLEE ) 100 UNIT/ML Pen Inject 24 Units subcutaneously once daily. Patient not taking: Reported on 02/17/2024 12/15/23     Insulin  Pen Needle (TECHLITE PEN NEEDLES) 31G X 5 MM MISC 4 (four) times daily. 08/21/22   Brendia Calton Squires, PA-C  Insulin  Pen Needle (UNIFINE PENTIPS) 31G X 5 MM MISC Use 4 times daily as directed 05/28/21     lisinopril  (ZESTRIL ) 40 MG tablet Take 40 mg by mouth. 08/19/18   [provider]  metolazone  (ZAROXOLYN ) 2.5 MG tablet Take 1 tablet (2.5 mg total) by mouth as directed by the heart failure clinic. 01/01/24   [name], MD  montelukast  (SINGULAIR ) 10 MG tablet Take 1 tablet (10 mg total) by mouth at bedtime. 03/01/24 03/01/25  [name], MD  mycophenolate  (MYFORTIC ) 180 MG EC tablet Take 2 tablets (360 mg total) by mouth every morning AND 1 tablet (180 mg total) every evening. 04/21/23     mycophenolate  (MYFORTIC ) 180 MG EC tablet Take 1 tablet (180 mg total) by mouth 2 (two) times daily. 03/05/24     omeprazole  (PRILOSEC) 20 MG capsule Take 1 capsule (20 mg total) by mouth daily. 03/12/24 03/12/25  [name], MD  oxyCODONE -acetaminophen  (PERCOCET) 10-325 MG tablet Take 1 tablet by mouth every 6 (six) hours as needed for up to 5 days. Patient not taking: Reported on 02/17/2024 12/02/23     oxyCODONE -acetaminophen  (PERCOCET) 10-325 MG tablet Take 1 tablet by mouth every 6 (six) hours as needed for pain. 03/16/24     predniSONE  (DELTASONE ) 5 MG tablet Take 1 tablet (5 mg total) by mouth daily. 03/05/24     rosuvastatin  (CRESTOR ) 10 MG tablet Take 1 tablet (10 mg total) by mouth daily. 10/30/23   [name], MD  sodium chloride  (OCEAN) 0.65 % SOLN nasal  spray Place 2 sprays into both nostrils every 8 (eight) hours for  10 days. 09/29/23 10/23/24  Franchot Novel, MD  sulfamethoxazole -trimethoprim  (BACTRIM ) 400-80 MG tablet Take 1 tablet by mouth 3 (three) times a week. 03/01/24     tacrolimus  (PROGRAF ) 1 MG capsule Take 1 mg by mouth. 12/19/23   [provider]  tacrolimus  (PROGRAF ) 1 MG capsule Take 2 capsules (2 mg total) by mouth every morning AND 1 capsule (1 mg total) every evening. 02/23/24     tacrolimus  (PROGRAF ) 1 MG capsule Take 2 capsules (2 mg total) by mouth in the morning AND 1 capsule (1 mg total) at bedtime. 03/05/24     tadalafil  (CIALIS ) 20 MG tablet Take 0.5-1 tablets (10-20 mg total) by mouth every other day as needed 01/14/24   [name], MD  tamsulosin  (FLOMAX ) 0.4 MG CAPS capsule Take 1 capsule (0.4 mg total) by mouth daily. 01/16/23   Vaillancourt, Samantha, PA-C  Testosterone  Enanthate (XYOSTED ) 75 MG/0.5ML SOAJ Inject 0.5 mLs into the skin once a week. 12/22/23   Vaillancourt, Samantha, PA-C  tirzepatide  (MOUNJARO ) 5 MG/0.5ML Pen Inject 5 mg into the skin every 7 (seven) days. 03/12/24     torsemide  (DEMADEX ) 100 MG tablet Take 1 tablet (100 mg total) by mouth 2 (two) times daily. 01/14/24   [name], MD  traMADol  (ULTRAM ) 50 MG tablet Take 1 tablet (50 mg total) by mouth every 6 (six) hours as needed for pain. 01/14/24     triamcinolone  (KENALOG ) 0.025 % cream Apply 1-2 Applications topically daily to sandpaper areas on legs. 01/27/24     Zinc  50 MG TABS Take 1 tablet (50 mg total) by mouth daily. 04/21/23   [name], MD    Family History Family History  Problem Relation Age of Onset   Heart disease Father    Kidney failure Father    Kidney disease Father    Diabetes Maternal Grandmother    Breast cancer Maternal Grandmother    Valvular heart disease Mother    Liver cancer Paternal Uncle    Liver cancer Paternal Grandmother    Prostate cancer Neg Hx     Social History Social  History[1]   Allergies   Iodinated contrast media, Iodine, Nsaids, Other, and Ibuprofen   Review of Systems Review of Systems  Respiratory:  Positive for cough and shortness of breath.   Neurological:  Positive for dizziness and light-headedness.  Psychiatric/Behavioral:  Positive for hallucinations.      Physical Exam Triage Vital Signs ED Triage Vitals [04/07/24 1315]  Encounter Vitals Group     BP 117/67     Girls Systolic BP Percentile      Girls Diastolic BP Percentile      Boys Systolic BP Percentile      Boys Diastolic BP Percentile      Pulse Rate 80     Resp (!) 28     Temp 98.2 F (36.8 C)     Temp src      SpO2 (!) 74 %     Weight      Height      Head Circumference      Peak Flow      Pain Score      Pain Loc      Pain Education      Exclude from Growth Chart    No data found.  Updated Vital Signs BP 117/67   Pulse 80   Temp 98.2 F (36.8 C)   Resp (!) 28   SpO2 96%   Visual Acuity  Right Eye Distance:   Left Eye Distance:   Bilateral Distance:    Right Eye Near:   Left Eye Near:    Bilateral Near:     Physical Exam Constitutional:      General: He is in acute distress.     Appearance: He is ill-appearing.  Cardiovascular:     Rate and Rhythm: Normal rate and regular rhythm.     Heart sounds: Normal heart sounds.  Pulmonary:     Effort: Tachypnea and respiratory distress present.     Comments: Breath sounds are diminished. Neurological:     General: No focal deficit present.     Mental Status: He is alert.     Gait: Gait abnormal.     Comments: Patient is unsteady when attempting to walk.  He is being held up by his wife as he walks to the exam room.      UC Treatments / Results  Labs (all labs ordered are listed, but only abnormal results are displayed) Labs Reviewed - No data to display  EKG   Radiology No results found.  Procedures Procedures (including critical care time)  Medications Ordered in UC Medications  - No data to display  Initial Impression / Assessment and Plan / UC Course  I have reviewed the triage vital signs and the nursing notes.  Pertinent labs & imaging results that were available during my care of the patient were reviewed by me and considered in my medical decision making (see chart for details).    Hypoxia, shortness of breath, dizziness.  O2 sats 70% on room air upon arrival; improved to 83% with 4 L via nasal cannula; improved to 96% on mask.  Sending patient to the ED via EMS.  Final Clinical Impressions(s) / UC Diagnoses   Final diagnoses:  Hypoxia  Shortness of breath  Dizziness   Discharge Instructions   None    ED Prescriptions   None    PDMP not reviewed this encounter.    [1]  Social History Tobacco Use   Smoking status: Never   Smokeless tobacco: Never  Vaping Use   Vaping status: Never Used  Substance Use Topics   Alcohol use: Not Currently    Comment: occ   Drug use: No     Corlis Burnard DEL, NP 04/07/24 1332  "

## 2024-04-07 NOTE — ED Notes (Signed)
 RN went into pt room due to low o2 sat. Pt is having audible wheezing and is more somnolent then when he arrived in the ED. Pt work of breathing is shallow and fast. Pt still on 6l/min via [place]. RN was able to sit pt up in high fowlers position and instructed pt to take deep breaths. Pt O2 came up at that time. RN notified provider, charge nurse and transferred pt to B pod and notified resp for bipap placement per MD verbal order.

## 2024-04-07 NOTE — ED Provider Notes (Signed)
 "  [facility] Provider Note    Event Date/Time   First MD Initiated Contact with Patient 04/07/24 1349     (approximate)   History   Chief Complaint: Shortness of Breath   HPI  Carl Mousa Prout. is a 64 y.o. male with a history of CHF, ESRD postrenal transplant, COPD who comes to the ED by EMS due to shortness of breath for the last 10 days, gradually worsening.  Associated with frequent cough.  Denies chest pain.  Patient initially went to urgent care where he was found to have oxygen  saturation of 75% on room air.  Oxygenation normalized to 92% on 6 L nasal cannula during EMS transport.  Patient does not use home oxygen  normally.  Patient also notes that he checks his weight daily, weight is currently 6 pounds increased from his usual euvolemic weight.  He has been compliant with his torsemide  diuretic as well as additional booster medications.        Past Medical History:  Diagnosis Date   Anal condyloma    Anemia    Anxiety    Aortic atherosclerosis    Aortic stenosis    a.) TTE 10/2021: Mod AS. AVA 1.06cm^2 (VTI). Mean grad ; b.) TTE 04/02/2022: no AV stenosis   Asthma, persistent controlled 02/25/2013   Attention or concentration deficit 04/26/2021   BPH with obstruction/lower urinary tract symptoms 09/25/2014   CAD (coronary artery disease) 2005   a.) LHC 1996 -> HG stenosis mLAD -> PTCA/PCI with stent x 1 (unk type) -> complicated by CFA pseudoanurysm (required surg);  b.) LHC 08/10/2002  -> 95% LAD, 70% ISR LAD, 80% pOM, 70% mRCA --> CVTS consult; c.) 4v CABG 08/13/2002; c.) 03/2018 MV: Small, fixed inferoapical and apical sep defect. No isc. EF 47% (60-65% by 05/2018 TTE); d.) 02/2021 MV: small Apical inf fixed defect. No isc -> low risk.   Cardiomegaly    Cellulitis and abscess of left leg 07/22/2020   Chronic atrial fibrillation    a.) CHA2DS2VASc = 4 (CHF, HTN, vascular disease history, T2DM);  b.) rate/rhythm maintained without  pharmacological intervention; chronically anticoagulated with apixaban    Chronic combined systolic and diastolic CHF (congestive heart failure)    a.) TTE 7/18 : EF 45-50%; b.) TTE 05/2018 : EF 60-65%, RVSP 74.8; c.) TTE 12/2018: EF 50-55%. Sev dil LA; d.) TTE 04/2019: EF 45-50%; e.) TTE 07/2021: EF 40-45%, G3DD; f.) TTE 10/2021: EF 40-45%, mild conc LVH, septal-lat dyssynchrony (LBBB), mildly red RVSF, mild-mod dil LA, mildly dil RA, mild-mod MS, mod AS; g.) TTE 04/02/2022: EF 40-45%, glob HK, LVH, red RVSF, RVE, sev LAE, mild-mod RAE, mild MR   Chronic venous stasis dermatitis of both lower extremities    Complicated UTI (urinary tract infection) 06/24/2022   Cytomegaloviral disease 2017   Diverticulosis of colon 04/22/2013   Elevated RIGHT hemidiaphragm    Erectile dysfunction    a.) on topical TRT + Trimix (alprostadil/papaverine/phentolamine) injections   ESRD (end stage renal disease) (HCC)    a.) dialysis dependent 2004-2012; b.) s/p cadaveric RIGHT renal transplant 09/13/2010   Essential hypertension 12/17/2010   Gout    History of 2019 novel coronavirus disease (COVID-19) 06/30/2019   History of bilateral cataract extraction 10/2017   History of renal transplant 09/13/2010   a.) s/p cadaveric donor transplant 09/13/2010   Hx of bilateral cataract extraction 10/2017   Hyperlipidemia    Hypogonadism in male 09/25/2014   Ischemic cardiomyopathy    Left cephalic vein  thrombosis 06/2019   Long term current use of anticoagulant    a.) apixaban    Long term current use of immunosuppressive drug    a.) mycophenolate  + prednisone  + tacrolimus    Major depressive disorder 10/04/2013   Mitral stenosis 11/07/2021   a.) TTE 11/07/2021: severe MAC, mild-mod MS (MPG 6 mmHg); b.) TTE 04/02/2022: no MV stenosis   Murmur    Obesity hypoventilation syndrome 03/31/2012   OSA treated with BiPAP 09/29/2012   PAH (pulmonary artery hypertension)    a. 04/2019 RHC: RA 19, RV 80/20, PA 78/31 (51), PCWP 25,  CO/CI 7.69/3.26. PVR 3.25 --> Sev PAH, likely primarily PV HTN; b.) RHC 04/04/2022: mRA 13, mPA 40, mPCWP 20, PA sat 59, AO sat 92, CO 7.63, CI 3.26, PVR 2.6 --> mod portal venous HTN   Pancreatitis    S/P CABG x 4 08/13/2002   a.) LIMA-LAD, LRA-OM, SVG-D1, SVG-dRCA   S/P gastric bypass    Synovitis of finger 06/11/2019   Trigger finger, unspecified little finger 12/23/2018   Type 2 diabetes mellitus with hyperglycemia, with long-term current use of insulin  09/09/2014   a.) has FreeStyle Libre CGM   Ulcer    Left shin goes to wound care center at [facility] qweek   Wears glasses    Wears hearing aid in both ears     Current Outpatient Rx   Order #: [id number] Class: Normal   Order #: [id number] Class: Normal   Order #: [id number] Class: Normal   Order #: [id number] Class: Normal   Order #: [id number] Class: No Print   Order #: [id number] Class: Normal   Order #: [id number] Class: Normal   Order #: [id number] Class: Normal   Order #: [id number] Class: Normal   Order #: [id number] Class: Historical Med   Order #: [id number] Class: Normal   Order #: [id number] Class: Normal   Order #: [id number] Class: Normal   Order #: [id number] Class: Normal   Order #: [id number] Class: Normal   Order #: [id number] Class: Normal   Order #: [id number] Class: Normal   Order #: [id number] Class: Historical Med   Order #: [id number] Class: Normal   Order #: [id number] Class: Normal   Order #: [id number] Class: Normal   Order #: [id number] Class: Normal   Order #: [id number] Class: Normal   Order #: [id number] Class: Normal   Order #: [id number] Class: Normal   Order #: [id number] Class: Normal   Order #: [id number] Class: Normal   Order #: [id number] Class: OTC   Order #: [id number] Class: Normal   Order #: [id number] Class: Historical Med   Order #: [id number] Class: Normal   Order #: [id number] Class: Normal   Order #: [id number] Class: Normal   Order #: [id number] Class: Normal   Order #: [id number] Class: Normal   Order #: [id number] Class: Normal    Order #: [id number] Class: Normal   Order #: [id number] Class: Normal   Order #: [id number] Class: Normal   Order #: [id number] Class: Normal    Past Surgical History:  Procedure Laterality Date   CARDIAC CATHETERIZATION N/A 08/10/2002   CATARACT EXTRACTION W/PHACO Right 10/29/2017   Procedure: CATARACT EXTRACTION PHACO AND INTRAOCULAR LENS PLACEMENT (IOC)  RIGHT DIABETIC;  Surgeon: Mittie Gaskin, MD;  Location: [facility];  Service: Ophthalmology;  Laterality: Right   CATARACT EXTRACTION W/PHACO Left 11/18/2017   Procedure: CATARACT EXTRACTION PHACO AND INTRAOCULAR LENS PLACEMENT (IOC) LEFT IVA/TOPICAL;  Surgeon: Mittie Gaskin, MD;  Location: [facility];  Service: Ophthalmology;  Laterality: Left   COLONOSCOPY WITH PROPOFOL  N/A 04/22/2013   Procedure: COLONOSCOPY WITH PROPOFOL ;  Surgeon: Toribio SHAUNNA Cedar, MD;  Location: [facility];  Service: Endoscopy;  Laterality: N/A;   CORONARY  ANGIOPLASTY WITH STENT PLACEMENT N/A 1996   CORONARY ARTERY BYPASS GRAFT N/A 08/13/2002   Procedure: 4v CORONARY ARTERY BYPASS GRAFTING; Location: [facility]; Surgeon: Dessa Laine, MD   CYSTOSCOPY WITH INSERTION OF UROLIFT N/A 12/25/2021   Procedure: CYSTOSCOPY WITH INSERTION OF UROLIFT;  Surgeon: Twylla Glendia BROCKS, MD;  Location: [facility];  Service: Urology;  Laterality: N/A;   DG ANGIO AV SHUNT*L*     right and left upper arms   FASCIOTOMY  03/03/2012   Procedure: FASCIOTOMY;  Surgeon: Arley JONELLE Curia, MD;  Location: [facility];  Service: Orthopedics;  Laterality: Right;  FASCIOTOMY RIGHT SMALL FINGER   FASCIOTOMY Left 08/17/2013   Procedure: FASCIOTOMY LEFT RING;  Surgeon: Arley JONELLE Curia, MD;  Location: [facility];  Service: Orthopedics;  Laterality: Left;   INCISION AND DRAINAGE ABSCESS Left 10/15/2015   Procedure: INCISION AND DRAINAGE ABSCESS;  Surgeon: Laneta JULIANNA Luna, MD;  Location: [facility];  Service: General;  Laterality: Left;   KIDNEY TRANSPLANT   09/13/2010   cadaver--at [facility]   LASER ABLATION CONDOLAMATA N/A 01/08/2023   Procedure: LASER REMOVAL ABLATION OF CONDYLOMATA;  Surgeon: Sheldon Standing, MD;  Location: [facility];  Service: General;  Laterality: N/A;  GEN AND LOCAL   LASER ABLATION CONDOLAMATA N/A 11/20/2023   Procedure: ABLATION, CONDYLOMA, USING LASER HEMORRHOIDAL LIGATION & HEMORRHOIDOPEXY, HEMORRHOIDECTOMY x 2;  Surgeon: Sheldon Standing, MD;  Location: [facility];  Service: General;  Laterality: N/A;  LASER REMOVAL ABLATION OF CONDYLOMATA ANORECTAL EXAMINATION UNDER ANESTHESIA   RECTAL EXAM UNDER ANESTHESIA N/A 01/08/2023   Procedure: ANORECTAL EXAM UNDER ANESTHESIA;  Surgeon: Sheldon Standing, MD;  Location: [facility];  Service: General;  Laterality: N/A;   RECTAL EXAM UNDER ANESTHESIA N/A 11/20/2023   Procedure: EXAM UNDER ANESTHESIA, RECTUM;  Surgeon: Sheldon Standing, MD;  Location: [facility];  Service: General;  Laterality: N/A;   RIGHT HEART CATH N/A 11/15/2016   Procedure: RIGHT HEART CATH;  Surgeon: Darron Deatrice LABOR, MD;  Location: [facility];  Service: Cardiovascular;  Laterality: N/A;   RIGHT HEART CATH N/A 05/03/2019   Procedure: RIGHT HEART CATH;  Surgeon: [name], MD;  Location: [facility];  Service: Cardiovascular;  Laterality: N/A;   RIGHT HEART CATH N/A 04/04/2022   Procedure: RIGHT HEART CATH;  Surgeon: [name], MD;  Location: [facility] CV [facility];  Service: Cardiovascular;  Laterality: N/A;   ROUX-EN-Y GASTRIC BYPASS N/A 2015   TYMPANIC MEMBRANE REPAIR Left 03/2010   VASECTOMY     WART FULGURATION N/A 05/31/2022   Procedure: FULGURATION ANAL WART;  Surgeon: Tye Millet, DO;  Location: [facility];  Service: General;  Laterality: N/A;    Physical Exam   Triage Vital Signs: ED Triage Vitals  Encounter Vitals Group     BP      Girls Systolic BP Percentile      Girls Diastolic BP Percentile      Boys Systolic BP Percentile      Boys Diastolic BP Percentile      Pulse      Resp      Temp       Temp src      SpO2      Weight      Height      Head Circumference      Peak Flow      Pain Score      Pain Loc      Pain Education  Exclude from Growth Chart     Most recent vital signs: Vitals:   04/07/24 1405  BP: 120/74  Pulse: 82  Resp: (!) 21  Temp: 98.2 F (36.8 C)  SpO2: 98%    General: Awake, no distress.  CV:  Good peripheral perfusion.  Regular rate rhythm Resp:  Normal effort.  Tachypnea, respiratory rate of 24.  Prolonged expiratory phase, frequent coughing.  Bibasilar crackles Abd:  No distention.  Soft nontender Other:  2+ pitting edema bilateral lower extremities   ED Results / Procedures / Treatments   Labs (all labs ordered are listed, but only abnormal results are displayed) Labs Reviewed  BLOOD GAS, VENOUS - Abnormal; Notable for the following components:      Result Value   pCO2, Ven 73 (*)    Bicarbonate 32.8 (*)    Acid-Base Excess 3.4 (*)    All other components within normal limits  RESP PANEL BY RT-PCR (RSV, FLU A&B, COVID)  RVPGX2  LACTIC ACID, PLASMA  COMPREHENSIVE METABOLIC PANEL WITH GFR  CBC WITH DIFFERENTIAL/PLATELET  PRO BRAIN NATRIURETIC PEPTIDE  PROCALCITONIN  TROPONIN T, HIGH SENSITIVITY     EKG Interpreted by me Atrial fibrillation, rate of 83.  Normal axis, left bundle branch block.  No acute ischemic changes.   RADIOLOGY Chest x-ray interpreted by me, shows pulmonary edema.  Radiology report reviewed   PROCEDURES:  .Critical Care  Performed by: Viviann Pastor, MD Authorized by: Viviann Pastor, MD   Critical care provider statement:    Critical care time (minutes):  35   Critical care time was exclusive of:  Separately billable procedures and treating other patients   Critical care was necessary to treat or prevent imminent or life-threatening deterioration of the following conditions:  Respiratory failure   Critical care was time spent personally by me on the following activities:  Development  of treatment plan with patient or surrogate, discussions with consultants, evaluation of patient's response to treatment, examination of patient, obtaining history from patient or surrogate, ordering and performing treatments and interventions, ordering and review of laboratory studies, ordering and review of radiographic studies, pulse oximetry, re-evaluation of patient's condition and review of old charts   Care discussed with: admitting provider      MEDICATIONS ORDERED IN ED: Medications  furosemide  (LASIX ) 160 mg in dextrose  5 % 50 mL IVPB (160 mg Intravenous New Bag/Given 04/07/24 1447)  magnesium  sulfate IVPB 2 g 50 mL (has no administration in time range)  methylPREDNISolone  sodium succinate (SOLU-MEDROL ) 125 mg/2 mL injection 125 mg (125 mg Intravenous Given 04/07/24 1448)  ipratropium-albuterol  (DUONEB) 0.5-2.5 (3) MG/3ML nebulizer solution 3 mL (3 mLs Nebulization Given 04/07/24 1447)  albuterol  (PROVENTIL ) (2.5 MG/3ML) 0.083% nebulizer solution 5 mg (5 mg Nebulization Given 04/07/24 1447)     IMPRESSION / MDM / ASSESSMENT AND PLAN / ED COURSE  I reviewed the triage vital signs and the nursing notes.  DDx: Pneumonia, pleural effusion, pulmonary edema, non-STEMI, pneumothorax, pulmonary embolism, AKI, electrolyte derangement, COVID, influenza, COPD exacerbation  Patient's presentation is most consistent with acute presentation with potential threat to life or bodily function.  Patient presents with shortness of breath and cough, found to be profoundly hypoxic on room air.  Not septic on arrival.  Will check labs, chest x-ray, start diuresis along with steroids and bronchodilators for suspected mixed COPD and CHF exacerbation.  Will need to admit for further stabilization.   ----------------------------------------- 2:57 PM on 04/07/2024 ----------------------------------------- VBG shows hypercapnia with pCO2 of 73.  This  is slightly higher than results seen on previous VBG's,  overall very similar.  Would not start BiPAP currently.  Plan to admit when labs result.      FINAL CLINICAL IMPRESSION(S) / ED DIAGNOSES   Final diagnoses:  Acute respiratory failure with hypoxia (HCC)     Rx / DC Orders   ED Discharge Orders     None        Note:  This document was prepared using Dragon voice recognition software and may include unintentional dictation errors.   Viviann Pastor, MD 04/07/24 1458  "

## 2024-04-08 ENCOUNTER — Observation Stay (HOSPITAL_COMMUNITY)
Admit: 2024-04-08 | Discharge: 2024-04-08 | Disposition: A | Discharge status: Home / Self Care | Attending: Internal Medicine | Admitting: Internal Medicine

## 2024-04-08 DIAGNOSIS — E1169 Type 2 diabetes mellitus with other specified complication: Secondary | ICD-10-CM | POA: Diagnosis not present

## 2024-04-08 DIAGNOSIS — I5021 Acute systolic (congestive) heart failure: Secondary | ICD-10-CM

## 2024-04-08 DIAGNOSIS — I132 Hypertensive heart and chronic kidney disease with heart failure and with stage 5 chronic kidney disease, or end stage renal disease: Secondary | ICD-10-CM | POA: Diagnosis present

## 2024-04-08 DIAGNOSIS — Z8616 Personal history of COVID-19: Secondary | ICD-10-CM | POA: Diagnosis not present

## 2024-04-08 DIAGNOSIS — N170 Acute kidney failure with tubular necrosis: Secondary | ICD-10-CM | POA: Diagnosis present

## 2024-04-08 DIAGNOSIS — N189 Chronic kidney disease, unspecified: Secondary | ICD-10-CM | POA: Diagnosis not present

## 2024-04-08 DIAGNOSIS — J4489 Other specified chronic obstructive pulmonary disease: Secondary | ICD-10-CM | POA: Diagnosis present

## 2024-04-08 DIAGNOSIS — N185 Chronic kidney disease, stage 5: Secondary | ICD-10-CM | POA: Diagnosis not present

## 2024-04-08 DIAGNOSIS — I5023 Acute on chronic systolic (congestive) heart failure: Secondary | ICD-10-CM | POA: Diagnosis not present

## 2024-04-08 DIAGNOSIS — E1122 Type 2 diabetes mellitus with diabetic chronic kidney disease: Secondary | ICD-10-CM | POA: Diagnosis present

## 2024-04-08 DIAGNOSIS — Y841 Kidney dialysis as the cause of abnormal reaction of the patient, or of later complication, without mention of misadventure at the time of the procedure: Secondary | ICD-10-CM | POA: Diagnosis present

## 2024-04-08 DIAGNOSIS — I2489 Other forms of acute ischemic heart disease: Secondary | ICD-10-CM | POA: Diagnosis present

## 2024-04-08 DIAGNOSIS — Y830 Surgical operation with transplant of whole organ as the cause of abnormal reaction of the patient, or of later complication, without mention of misadventure at the time of the procedure: Secondary | ICD-10-CM | POA: Diagnosis present

## 2024-04-08 DIAGNOSIS — I482 Chronic atrial fibrillation, unspecified: Secondary | ICD-10-CM

## 2024-04-08 DIAGNOSIS — D631 Anemia in chronic kidney disease: Secondary | ICD-10-CM | POA: Diagnosis present

## 2024-04-08 DIAGNOSIS — Y828 Other medical devices associated with adverse incidents: Secondary | ICD-10-CM | POA: Diagnosis present

## 2024-04-08 DIAGNOSIS — I5022 Chronic systolic (congestive) heart failure: Secondary | ICD-10-CM | POA: Diagnosis not present

## 2024-04-08 DIAGNOSIS — I255 Ischemic cardiomyopathy: Secondary | ICD-10-CM | POA: Diagnosis not present

## 2024-04-08 DIAGNOSIS — Z515 Encounter for palliative care: Secondary | ICD-10-CM | POA: Diagnosis not present

## 2024-04-08 DIAGNOSIS — J9601 Acute respiratory failure with hypoxia: Secondary | ICD-10-CM | POA: Diagnosis present

## 2024-04-08 DIAGNOSIS — Z66 Do not resuscitate: Secondary | ICD-10-CM | POA: Diagnosis not present

## 2024-04-08 DIAGNOSIS — N186 End stage renal disease: Secondary | ICD-10-CM | POA: Diagnosis present

## 2024-04-08 DIAGNOSIS — I4821 Permanent atrial fibrillation: Secondary | ICD-10-CM | POA: Diagnosis not present

## 2024-04-08 DIAGNOSIS — T8610 Unspecified complication of kidney transplant: Secondary | ICD-10-CM | POA: Diagnosis not present

## 2024-04-08 DIAGNOSIS — D62 Acute posthemorrhagic anemia: Secondary | ICD-10-CM | POA: Diagnosis not present

## 2024-04-08 DIAGNOSIS — J9602 Acute respiratory failure with hypercapnia: Secondary | ICD-10-CM | POA: Diagnosis present

## 2024-04-08 DIAGNOSIS — I2510 Atherosclerotic heart disease of native coronary artery without angina pectoris: Secondary | ICD-10-CM | POA: Diagnosis not present

## 2024-04-08 DIAGNOSIS — I4729 Other ventricular tachycardia: Secondary | ICD-10-CM | POA: Diagnosis present

## 2024-04-08 DIAGNOSIS — I5020 Unspecified systolic (congestive) heart failure: Secondary | ICD-10-CM | POA: Diagnosis not present

## 2024-04-08 DIAGNOSIS — Y832 Surgical operation with anastomosis, bypass or graft as the cause of abnormal reaction of the patient, or of later complication, without mention of misadventure at the time of the procedure: Secondary | ICD-10-CM | POA: Diagnosis present

## 2024-04-08 DIAGNOSIS — I10 Essential (primary) hypertension: Secondary | ICD-10-CM | POA: Diagnosis not present

## 2024-04-08 DIAGNOSIS — Z951 Presence of aortocoronary bypass graft: Secondary | ICD-10-CM | POA: Diagnosis not present

## 2024-04-08 DIAGNOSIS — N390 Urinary tract infection, site not specified: Secondary | ICD-10-CM | POA: Diagnosis present

## 2024-04-08 DIAGNOSIS — G9341 Metabolic encephalopathy: Secondary | ICD-10-CM | POA: Diagnosis not present

## 2024-04-08 DIAGNOSIS — Z7189 Other specified counseling: Secondary | ICD-10-CM | POA: Diagnosis not present

## 2024-04-08 DIAGNOSIS — E785 Hyperlipidemia, unspecified: Secondary | ICD-10-CM | POA: Diagnosis not present

## 2024-04-08 DIAGNOSIS — Z1152 Encounter for screening for COVID-19: Secondary | ICD-10-CM | POA: Diagnosis not present

## 2024-04-08 DIAGNOSIS — Z7901 Long term (current) use of anticoagulants: Secondary | ICD-10-CM | POA: Diagnosis not present

## 2024-04-08 DIAGNOSIS — Z794 Long term (current) use of insulin: Secondary | ICD-10-CM | POA: Diagnosis not present

## 2024-04-08 DIAGNOSIS — I5043 Acute on chronic combined systolic (congestive) and diastolic (congestive) heart failure: Secondary | ICD-10-CM | POA: Diagnosis present

## 2024-04-08 DIAGNOSIS — K766 Portal hypertension: Secondary | ICD-10-CM | POA: Diagnosis present

## 2024-04-08 DIAGNOSIS — I2721 Secondary pulmonary arterial hypertension: Secondary | ICD-10-CM | POA: Diagnosis present

## 2024-04-08 DIAGNOSIS — T8619 Other complication of kidney transplant: Secondary | ICD-10-CM | POA: Diagnosis present

## 2024-04-08 DIAGNOSIS — D696 Thrombocytopenia, unspecified: Secondary | ICD-10-CM | POA: Diagnosis present

## 2024-04-08 DIAGNOSIS — Z992 Dependence on renal dialysis: Secondary | ICD-10-CM | POA: Diagnosis not present

## 2024-04-08 DIAGNOSIS — I509 Heart failure, unspecified: Secondary | ICD-10-CM | POA: Diagnosis not present

## 2024-04-08 LAB — CBC
HCT: 36.9 % — ABNORMAL LOW (ref 39.0–52.0)
Hemoglobin: 11 g/dL — ABNORMAL LOW (ref 13.0–17.0)
MCH: 30.7 pg (ref 26.0–34.0)
MCHC: 29.8 g/dL — ABNORMAL LOW (ref 30.0–36.0)
MCV: 103.1 fL — ABNORMAL HIGH (ref 80.0–100.0)
Platelets: 72 K/uL — ABNORMAL LOW (ref 150–400)
RBC: 3.58 MIL/uL — ABNORMAL LOW (ref 4.22–5.81)
RDW: 16.8 % — ABNORMAL HIGH (ref 11.5–15.5)
WBC: 2.1 K/uL — ABNORMAL LOW (ref 4.0–10.5)
nRBC: 0 % (ref 0.0–0.2)

## 2024-04-08 LAB — RESPIRATORY PANEL BY PCR

## 2024-04-08 LAB — BASIC METABOLIC PANEL WITH GFR
Anion gap: 14 (ref 5–15)
Anion gap: 12 (ref 5–15)
Anion gap: 14 (ref 5–15)
BUN: 106 mg/dL — ABNORMAL HIGH (ref 8–23)
BUN: 109 mg/dL — ABNORMAL HIGH (ref 8–23)
BUN: 116 mg/dL — ABNORMAL HIGH (ref 8–23)
CO2: 26 mmol/L (ref 22–32)
CO2: 27 mmol/L (ref 22–32)
CO2: 24 mmol/L (ref 22–32)
Calcium: 9 mg/dL (ref 8.9–10.3)
Calcium: 9.1 mg/dL (ref 8.9–10.3)
Calcium: 8.6 mg/dL — ABNORMAL LOW (ref 8.9–10.3)
Chloride: 102 mmol/L (ref 98–111)
Chloride: 103 mmol/L (ref 98–111)
Chloride: 103 mmol/L (ref 98–111)
Creatinine, Ser: 5.18 mg/dL — ABNORMAL HIGH (ref 0.61–1.24)
Creatinine, Ser: 5.55 mg/dL — ABNORMAL HIGH (ref 0.61–1.24)
Creatinine, Ser: 5.96 mg/dL — ABNORMAL HIGH (ref 0.61–1.24)
GFR, Estimated: 12 mL/min — ABNORMAL LOW
GFR, Estimated: 11 mL/min — ABNORMAL LOW
GFR, Estimated: 10 mL/min — ABNORMAL LOW
Glucose, Bld: 171 mg/dL — ABNORMAL HIGH (ref 70–99)
Glucose, Bld: 232 mg/dL — ABNORMAL HIGH (ref 70–99)
Glucose, Bld: 232 mg/dL — ABNORMAL HIGH (ref 70–99)
Potassium: 4.6 mmol/L (ref 3.5–5.1)
Potassium: 4.6 mmol/L (ref 3.5–5.1)
Potassium: 4.7 mmol/L (ref 3.5–5.1)
Sodium: 142 mmol/L (ref 135–145)
Sodium: 142 mmol/L (ref 135–145)
Sodium: 141 mmol/L (ref 135–145)

## 2024-04-08 LAB — ECHOCARDIOGRAM COMPLETE
AR max vel: 1.42 cm2
AV Area VTI: 1.53 cm2
AV Area mean vel: 1.45 cm2
AV Mean grad: 12.7 mmHg
AV Peak grad: 20.9 mmHg
Ao pk vel: 2.29 m/s
Area-P 1/2: 3.21 cm2
Calc EF: 16.7 %
MV VTI: 2.2 cm2
S' Lateral: 4.6 cm
Single Plane A2C EF: 10.6 %
Single Plane A4C EF: 22.4 %
Weight: 4040.96 [oz_av]

## 2024-04-08 LAB — CBG MONITORING, ED
Glucose-Capillary: 162 mg/dL — ABNORMAL HIGH (ref 70–99)
Glucose-Capillary: 173 mg/dL — ABNORMAL HIGH (ref 70–99)
Glucose-Capillary: 215 mg/dL — ABNORMAL HIGH (ref 70–99)
Glucose-Capillary: 201 mg/dL — ABNORMAL HIGH (ref 70–99)

## 2024-04-08 LAB — SODIUM, URINE, RANDOM: Sodium, Ur: <30 mmol/L

## 2024-04-08 MED ORDER — TACROLIMUS 1 MG PO CAPS
1.0000 mg | ORAL_CAPSULE | Freq: Every day | ORAL | Status: DC
  Administered 2024-04-08 – 2024-04-10 (×3): 1 mg via ORAL
  Filled 2024-04-08 (×6): qty 1

## 2024-04-08 MED ORDER — TACROLIMUS 1 MG PO CAPS
2.0000 mg | ORAL_CAPSULE | Freq: Every morning | ORAL | Status: DC
  Administered 2024-04-08 – 2024-04-11 (×4): 2 mg via ORAL
  Filled 2024-04-08 (×4): qty 2

## 2024-04-08 MED ORDER — SODIUM CHLORIDE 0.9 % IV SOLN: INTRAVENOUS | Status: DC

## 2024-04-08 NOTE — ED Notes (Signed)
 Pt refusing BiPAP. Transitioned to 8L nonrebreather.

## 2024-04-08 NOTE — Progress Notes (Signed)
 " [facility]  ROUNDING NOTE   Subjective:   [name]. 64 y.o. male with past medical history of ESRD status post renal transplantation in 2012, chronic systolic heart failure ejection fraction 40 to 45%, atrial fibrillation, coronary disease, obesity, aortic stenosis, history of BPH, chronic venous stasis of both lower extremities, diverticulosis, erectile dysfunction, hyperlipidemia, depression, obstructive sleep apnea, pulmonary artery hypertension, status post gastric bypass, diabetes mellitus type 2. He presents with shortness of breath and altered gait for 10 days. Patient is admitted for Acute hypoxic respiratory failure (HCC) [J96.01]  Patient is known to our practice from previous admissions.  He is currently followed by [facility] nephrology.  Patient is seen resting on stretcher.  Family at bedside.  Patient states he has been having difficulty breathing over the past few days.  Does also endorse decreased oral intake.  States that his urine output has also declined.  Family states they were trying to self treat with breathing treatments outpatient.  Symptoms continued to worsen.  Labs on ED arrival concerning for BUN 100, creatinine 4.75, BNP greater than 35,000, troponin 129/118, and hemoglobin 11.3.  Expanded respiratory panel positive for rhinovirus.  UA appears cloudy with little leukocytes and proteinuria.  Urine culture pending.  Chest x-ray shows congestive heart failure with worsening volume status.  This patient is a renal transplant recipient, we have been consulted to help manage acute kidney injury.   Objective:  Vital signs in last 24 hours:  Temp:  [97.5 F (36.4 C)-98.4 F (36.9 C)] 97.9 F (36.6 C) (01/15 1406) Pulse Rate:  [63-123] 71 (01/15 1415) Resp:  [14-23] 20 (01/15 1415) BP: (101-136)/(49-81) 109/49 (01/15 1400) SpO2:  [66 %-100 %] 98 % (01/15 1415) FiO2 (%):  [40 %] 40 % (01/14 2345) Weight:  [114.6 kg] 114.6 kg (01/14 2119)  Weight  change:  Filed Weights   04/07/24 2119  Weight: 114.6 kg    Intake/Output: I/O last 3 completed shifts: In: -  Out: 80 [Urine:80]   Intake/Output this shift:  No intake/output data recorded.  Physical Exam: General: Ill-appearing  Head: Normocephalic, atraumatic. Moist oral mucosal membranes  Eyes: Anicteric  Lungs:  Coarse  Heart: Regular rate and rhythm  Abdomen:  Soft, nontender  Extremities: 1+ peripheral edema.  Neurologic: Awake, alert, conversant  Skin: Warm,dry, no rash       Basic Metabolic Panel: Recent Labs  Lab 04/07/24 1408 04/07/24 1929 04/07/24 2159 04/08/24 0456  NA 144  --  143 142  K 4.3  --  4.5 4.6  CL 104  --  103 102  CO2 25  --  25 26  GLUCOSE 121*  --  159* 171*  BUN 100*  --  102* 106*  CREATININE 4.75*  --  4.95* 5.18*  CALCIUM  8.9  --  8.7* 9.0  MG  --  2.9*  --   --     Liver Function Tests: Recent Labs  Lab 04/07/24 1408  AST 26  ALT 16  ALKPHOS 203*  BILITOT 0.8  PROT 7.3  ALBUMIN  4.1   No results for input(s): LIPASE, AMYLASE in the last 168 hours. No results for input(s): AMMONIA in the last 168 hours.  CBC: Recent Labs  Lab 04/07/24 1408 04/08/24 0456  WBC 4.5 2.1*  NEUTROABS 3.6  --   HGB 11.3* 11.0*  HCT 38.1* 36.9*  MCV 102.7* 103.1*  PLT 82* 72*    Cardiac Enzymes: No results for input(s): CKTOTAL, CKMB, CKMBINDEX, TROPONINI in  the last 168 hours.  BNP: Invalid input(s): POCBNP  CBG: Recent Labs  Lab 04/07/24 2154 04/08/24 0936 04/08/24 1124  GLUCAP 160* 162* 173*    Microbiology: Results for orders placed or performed during the hospital encounter of 04/07/24  Resp panel by RT-PCR (RSV, Flu A&B, Covid) Anterior Nasal Swab     Status: None   Collection Time: 04/07/24  2:08 PM   Specimen: Anterior Nasal Swab  Result Value Ref Range Status   SARS Coronavirus 2 by RT PCR NEGATIVE NEGATIVE Final    Comment: (NOTE) SARS-CoV-2 target nucleic acids are NOT DETECTED.  The  SARS-CoV-2 RNA is generally detectable in upper respiratory specimens during the acute phase of infection. The lowest concentration of SARS-CoV-2 viral copies this assay can detect is 138 copies/mL. A negative result does not preclude SARS-Cov-2 infection and should not be used as the sole basis for treatment or other patient management decisions. A negative result may occur with  improper specimen collection/handling, submission of specimen other than nasopharyngeal swab, presence of viral mutation(s) within the areas targeted by this assay, and inadequate number of viral copies(<138 copies/mL). A negative result must be combined with clinical observations, patient history, and epidemiological information. The expected result is Negative.  Fact Sheet for Patients:  bloggercourse.com  Fact Sheet for Healthcare Providers:  seriousbroker.it  This test is no t yet approved or cleared by the [place]  FDA and  has been authorized for detection and/or diagnosis of SARS-CoV-2 by FDA under an Emergency Use Authorization (EUA). This EUA will remain  in effect (meaning this test can be used) for the duration of the COVID-19 declaration under Section 564(b)(1) of the Act, 21 U.S.C.section 360bbb-3(b)(1), unless the authorization is terminated  or revoked sooner.       Influenza A by PCR NEGATIVE NEGATIVE Final   Influenza B by PCR NEGATIVE NEGATIVE Final    Comment: (NOTE) The Xpert Xpress SARS-CoV-2/FLU/RSV plus assay is intended as an aid in the diagnosis of influenza from Nasopharyngeal swab specimens and should not be used as a sole basis for treatment. Nasal washings and aspirates are unacceptable for Xpert Xpress SARS-CoV-2/FLU/RSV testing.  Fact Sheet for Patients: bloggercourse.com  Fact Sheet for Healthcare Providers: seriousbroker.it  This test is not yet approved or  cleared by the [place]  FDA and has been authorized for detection and/or diagnosis of SARS-CoV-2 by FDA under an Emergency Use Authorization (EUA). This EUA will remain in effect (meaning this test can be used) for the duration of the COVID-19 declaration under Section 564(b)(1) of the Act, 21 U.S.C. section 360bbb-3(b)(1), unless the authorization is terminated or revoked.     Resp Syncytial Virus by PCR NEGATIVE NEGATIVE Final    Comment: (NOTE) Fact Sheet for Patients: bloggercourse.com  Fact Sheet for Healthcare Providers: seriousbroker.it  This test is not yet approved or cleared by the [place]  FDA and has been authorized for detection and/or diagnosis of SARS-CoV-2 by FDA under an Emergency Use Authorization (EUA). This EUA will remain in effect (meaning this test can be used) for the duration of the COVID-19 declaration under Section 564(b)(1) of the Act, 21 U.S.C. section 360bbb-3(b)(1), unless the authorization is terminated or revoked.  Performed at [facility], [address] [place], [place] [address]   Respiratory (~20 pathogens) panel by PCR     Status: Abnormal   Collection Time: 04/07/24  7:29 PM   Specimen: Nasopharyngeal Swab; Respiratory  Result Value Ref Range Status   Adenovirus NOT DETECTED  NOT DETECTED Final   Coronavirus 229E NOT DETECTED NOT DETECTED Final    Comment: (NOTE) The Coronavirus on the Respiratory Panel, DOES NOT test for the novel  Coronavirus (2019 nCoV)    Coronavirus HKU1 NOT DETECTED NOT DETECTED Final   Coronavirus NL63 NOT DETECTED NOT DETECTED Final   Coronavirus OC43 NOT DETECTED NOT DETECTED Final   Metapneumovirus NOT DETECTED NOT DETECTED Final   Rhinovirus / Enterovirus DETECTED (A) NOT DETECTED Final   Influenza A NOT DETECTED NOT DETECTED Final   Influenza B NOT DETECTED NOT DETECTED Final   Parainfluenza Virus 1 NOT DETECTED NOT DETECTED Final    Parainfluenza Virus 2 NOT DETECTED NOT DETECTED Final   Parainfluenza Virus 3 NOT DETECTED NOT DETECTED Final   Parainfluenza Virus 4 NOT DETECTED NOT DETECTED Final   Respiratory Syncytial Virus NOT DETECTED NOT DETECTED Final   Bordetella pertussis NOT DETECTED NOT DETECTED Final   Bordetella Parapertussis NOT DETECTED NOT DETECTED Final   Chlamydophila pneumoniae NOT DETECTED NOT DETECTED Final   Mycoplasma pneumoniae NOT DETECTED NOT DETECTED Final    Comment: Performed at [facility], [address] [place], [place] [address]   *Note: Due to a large number of results and/or encounters for the requested time period, some results have not been displayed. A complete set of results can be found in Results Review.    Coagulation Studies: No results for input(s): LABPROT, INR in the last 72 hours.  Urinalysis: Recent Labs    04/07/24 1929  COLORURINE YELLOW*  LABSPEC 1.013  PHURINE 5.0  GLUCOSEU 50*  HGBUR SMALL*  BILIRUBINUR NEGATIVE  KETONESUR NEGATIVE  PROTEINUR 30*  NITRITE NEGATIVE  LEUKOCYTESUR SMALL*      Imaging: ECHOCARDIOGRAM COMPLETE Result Date: 04/08/2024    ECHOCARDIOGRAM REPORT   Patient Name:   [name]. Date of Exam: 04/08/2024 Medical Rec #:  [id number]               Height:       70.0 in Accession #:    [id number]              Weight:       252.6 lb Date of Birth:  11/14/1960               BSA:          2.305 m Patient Age:    63 years                BP:           125/65 mmHg Patient Gender: M                       HR:           67 bpm. Exam Location:  [facility] Procedure: 2D Echo, Cardiac Doppler and Color Doppler (Both Spectral and Color            Flow Doppler were utilized during procedure). Indications:     CHF-acute systolic I50.21  History:         Patient has prior history of Echocardiogram examinations, most                  recent 09/27/2023. CAD, Arrythmias:Atrial Fibrillation; Risk                  Factors:Hypertension. Aortic  stenosis.  Sonographer:     Christopher Furnace Referring Phys:  [id number] [facility] Diagnosing Phys: Evalene  Gollan MD IMPRESSIONS  1. Left ventricular ejection fraction, by estimation, is 40 to 45%. Left ventricular ejection fraction by PLAX is 48 %. The left ventricle has mildly decreased function. The left ventricle demonstrates regional wall motion abnormalities (septal motion consistent with bundle branch block).  2. The left ventricular internal cavity size was mildly dilated. There is mild left ventricular hypertrophy. Left ventricular diastolic parameters are indeterminate.  3. Right ventricular systolic function is low normal. The right ventricular size is mildly enlarged. There is moderately elevated pulmonary artery systolic pressure. The estimated right ventricular systolic pressure is 47.5 mmHg.  4. Left atrial size was moderately dilated.  5. The mitral valve is normal in structure. Mild to moderate mitral valve regurgitation. No evidence of mitral stenosis. The mean mitral valve gradient is 4.0 mmHg. Moderate mitral annular calcification.  6. The aortic valve is normal in structure. Aortic valve regurgitation is not visualized. No aortic stenosis is present.  7. The inferior vena cava is normal in size with greater than 50% respiratory variability, suggesting right atrial pressure of 3 mmHg. FINDINGS  Left Ventricle: Left ventricular ejection fraction, by estimation, is 40 to 45%. Left ventricular ejection fraction by PLAX is 48 %. The left ventricle has mildly decreased function. The left ventricle demonstrates regional wall motion abnormalities. Strain was performed and the global longitudinal strain is indeterminate. The left ventricular internal cavity size was mildly dilated. There is mild left ventricular hypertrophy. Left ventricular diastolic parameters are indeterminate. Right Ventricle: The right ventricular size is mildly enlarged. No increase in right ventricular wall thickness. Right ventricular  systolic function is low normal. There is moderately elevated pulmonary artery systolic pressure. The tricuspid regurgitant  velocity is 3.26 m/s, and with an assumed right atrial pressure of 5 mmHg, the estimated right ventricular systolic pressure is 47.5 mmHg. Left Atrium: Left atrial size was moderately dilated. Right Atrium: Right atrial size was normal in size. Pericardium: There is no evidence of pericardial effusion. Mitral Valve: The mitral valve is normal in structure. Moderate mitral annular calcification. Mild to moderate mitral valve regurgitation. No evidence of mitral valve stenosis. MV peak gradient, 11.0 mmHg. The mean mitral valve gradient is 4.0 mmHg. Tricuspid Valve: The tricuspid valve is normal in structure. Tricuspid valve regurgitation is not demonstrated. No evidence of tricuspid stenosis. Aortic Valve: The aortic valve is normal in structure. Aortic valve regurgitation is not visualized. No aortic stenosis is present. Aortic valve mean gradient measures 12.7 mmHg. Aortic valve peak gradient measures 20.9 mmHg. Aortic valve area, by VTI measures 1.53 cm. Pulmonic Valve: The pulmonic valve was normal in structure. Pulmonic valve regurgitation is not visualized. No evidence of pulmonic stenosis. Aorta: The aortic root is normal in size and structure. Venous: The inferior vena cava is normal in size with greater than 50% respiratory variability, suggesting right atrial pressure of 3 mmHg. IAS/Shunts: No atrial level shunt detected by color flow Doppler. Additional Comments: 3D was performed not requiring image post processing on an independent workstation and was indeterminate.  LEFT VENTRICLE PLAX 2D LV EF:         Left            Diastology                ventricular     LV e' medial:   5.22 cm/s                ejection        LV E/e' medial: 30.3  fraction by                PLAX is 48                %. LVIDd:         6.10 cm LVIDs:         4.60 cm LV PW:         1.40 cm LV  IVS:        1.40 cm LVOT diam:     2.00 cm LV SV:         70 LV SV Index:   30 LVOT Area:     3.14 cm  LV Volumes (MOD) LV vol d, MOD    180.0 ml A2C: LV vol d, MOD    86.1 ml A4C: LV vol s, MOD    161.0 ml A2C: LV vol s, MOD    66.8 ml A4C: LV SV MOD A2C:   19.0 ml LV SV MOD A4C:   86.1 ml LV SV MOD BP:    23.1 ml RIGHT VENTRICLE RV Basal diam:  4.10 cm RV Mid diam:    4.30 cm LEFT ATRIUM             Index        RIGHT ATRIUM           Index LA diam:        5.80 cm 2.52 cm/m   RA Area:     18.00 cm LA Vol (A2C):   84.6 ml 36.70 ml/m  RA Volume:   41.10 ml  17.83 ml/m LA Vol (A4C):   82.9 ml 35.97 ml/m LA Biplane Vol: 88.7 ml 38.48 ml/m  AORTIC VALVE AV Area (Vmax):    1.42 cm AV Area (Vmean):   1.45 cm AV Area (VTI):     1.53 cm AV Vmax:           228.67 cm/s AV Vmean:          164.667 cm/s AV VTI:            0.459 m AV Peak Grad:      20.9 mmHg AV Mean Grad:      12.7 mmHg LVOT Vmax:         103.00 cm/s LVOT Vmean:        76.200 cm/s LVOT VTI:          0.223 m LVOT/AV VTI ratio: 0.49  AORTA Ao Root diam: 3.00 cm MITRAL VALVE                TRICUSPID VALVE MV Area (PHT): 3.21 cm     TR Peak grad:   42.5 mmHg MV Area VTI:   2.20 cm     TR Vmax:        326.00 cm/s MV Peak grad:  11.0 mmHg MV Mean grad:  4.0 mmHg     SHUNTS MV Vmax:       1.66 m/s     Systemic VTI:  0.22 m MV Vmean:      87.6 cm/s    Systemic Diam: 2.00 cm MV Decel Time: 236 msec MV E velocity: 158.00 cm/s Evalene Lunger MD Electronically signed by Evalene Lunger MD Signature Date/Time: 04/08/2024/1:23:21 PM    Final    US  RENAL Result Date: 04/07/2024 EXAM: RETROPERITONEAL ULTRASOUND OF THE KIDNEYS 04/07/2024 07:43:14 PM TECHNIQUE: Real-time ultrasonography of the retroperitoneum, specifically the kidneys and urinary bladder, was performed. COMPARISON: None available. CLINICAL  HISTORY: AKI (acute kidney injury) FINDINGS: RIGHT KIDNEY: The native right kidney is atrophic and echogenic in keeping with chronic renal insufficiency, and is  largely obscured by overlying bowel gas. This is not well assessed on this examination. There is no hydronephrosis involving the visualized portion of the native right kidney. LEFT KIDNEY: The native left kidney is atrophic and echogenic in keeping with chronic renal insufficiency, and is largely obscured by overlying bowel gas. This is not well assessed on this examination. There is no hydronephrosis involving the visualized portion of the native left kidney. TRANSPLANT KIDNEY: Right lower quadrant transplant kidney is identified. The transplant kidney measures 12.1 x 6.5 x 6.7 cm. Volume: 273.6 cm. No hydronephrosis. No perinephric fluid collection. Normal cortical thickness and echogenicity. No intrarenal masses or calcifications are seen. Normal color flow vascularity. BLADDER: Limited images of the bladder are unremarkable. IMPRESSION: 1. Right lower quadrant transplant kidney without hydronephrosis or perinephric fluid collection. 2. Atrophic and echogenic native kidneys, consistent with chronic renal insufficiency, largely obscured by overlying bowel gas, with no hydronephrosis involving the visualized portions. Electronically signed by: Dorethia Molt MD 04/07/2024 07:49 PM EST RP Workstation: [id number]   DG Chest Portable 1 View Result Date: 04/07/2024 CLINICAL DATA:  Short of breath, cough EXAM: PORTABLE CHEST 1 VIEW COMPARISON:  01/26/2024 FINDINGS: Single frontal view of the chest demonstrates postsurgical changes from CABG. Cardiac silhouette remains enlarged. There is increased pulmonary vascular congestion with patchy bilateral airspace disease greatest at the left lung base. Trace bilateral pleural effusions. No pneumothorax. IMPRESSION: 1. Congestive heart failure, with worsening volume status since prior study. Electronically Signed   By: Ozell Daring M.D.   On: 04/07/2024 14:56     Medications:    sodium chloride  40 mL/hr at 04/08/24 9045   cefTRIAXone  (ROCEPHIN )  IV Stopped  (04/07/24 2152)    apixaban   5 mg Oral BID   furosemide   80 mg Intravenous BID   insulin  aspart  0-5 Units Subcutaneous QHS   insulin  aspart  0-9 Units Subcutaneous TID WC   mycophenolate   360 mg Oral q morning   And   mycophenolate   180 mg Oral QHS   predniSONE   5 mg Oral Daily   sodium chloride  flush  3 mL Intravenous Q12H   tacrolimus   1 mg Oral QHS   tacrolimus   2 mg Oral q morning   tamsulosin   0.4 mg Oral Daily   acetaminophen  **OR** acetaminophen , ondansetron  **OR** ondansetron  (ZOFRAN ) IV, senna-docusate  Assessment/ Plan:  [name] is a 65 y.o.  male with past medical history of ESRD status post renal transplantation in 2012, chronic systolic heart failure ejection fraction 40 to 45%, atrial fibrillation, coronary disease, obesity, aortic stenosis, history of BPH, chronic venous stasis of both lower extremities, diverticulosis, erectile dysfunction, hyperlipidemia, depression, obstructive sleep apnea, pulmonary artery hypertension, status post gastric bypass, diabetes mellitus type 2.  Acute Kidney Injury on chronic kidney disease stage 3 BT with baseline creatinine 2.72 on 19-Feb-2024. Deceased donor kidney transplant 08/2010  Acute kidney injury secondary to dehydration at this time.  Patient reports persistent diarrhea with poor oral intake spanning a few days.  Will attempt fluid resuscitation with IV hydration, normal saline at 40 mL/h.  No acute indication for dialysis but discussed with the patient renal replacement therapy may be necessary.  Will continue to monitor for now.  Continue outpatient immunosuppressive medications, prednisone , mycophenolate  and tacrolimus .   Lab Results  Component Value Date   CREATININE 5.18 (H)  04/08/2024   CREATININE 4.95 (H) 04/07/2024   CREATININE 4.75 (H) 04/07/2024    Intake/Output Summary (Last 24 hours) at 04/08/2024 1433 Last data filed at 04/07/2024 1955 Gross per 24 hour  Intake --  Output 80 ml  Net -80 ml    2. Anemia of chronic kidney disease Lab Results  Component Value Date   HGB 11.0 (L) 04/08/2024    Hemoglobin acceptable for this patient.  3.  Acute respiratory failure, patient with history of CHF.  Concern for volume overload.  Primary team has ordered IV furosemide .  Patient also positive for rhinovirus.  Continue supportive measures.  4. Diabetes mellitus type II with chronic kidney disease/renal manifestations: insulin  dependent. Home regimen includes  Farxiga  and Mounjaro . Most recent hemoglobin A1c is 6.2 on 03/12/24.     LOS: 0 Tex Conroy 1/15/20262:33 PM   "

## 2024-04-08 NOTE — Evaluation (Signed)
 Occupational Therapy Evaluation Patient Details Name: [name]. MRN: [id number] DOB: [birth year] Today's Date: 04/08/2024   History of Present Illness   64 y.o. year old male with medical history of HTN, HLD, T2DM, CHF (EF 45%) and 09/2023, chronic atrial fibrillation, BPH, OSA, ESRD status post renal transplant presenting to the ED with worsening shortness of breath.  Patient has been having URI symptoms for about 10 days.  His symptoms include shortness of breath, coughing but patient denies any fevers or chills. States his breathing has worsened over the last 3 days. He states his medications were adjusted a few days ago.     Clinical Impressions Patient presenting with decreased Ind in self care,balance, functional mobility, endurance, and safety awareness. Patient reports living at home home with wife. He has RW from prior admission but endorses more furniture walking within home. Pt has needed increasing assistance as he progressively has become weaker over the last several months. Patient currently functioning at mod - max A for bed mobility. Pt sits on EOB and stands with mod A with immediate buckling of B knees and returning to sit on EOB. Pt sitting on EOB for ~ 7-8 minutes with min guard and returning to supine with max A. Patient will benefit from acute OT to increase overall independence in the areas of ADLs, functional mobility, and safety awareness in order to safely discharge.     If plan is discharge home, recommend the following:   A lot of help with walking and/or transfers;A lot of help with bathing/dressing/bathroom;Assistance with cooking/housework;Assist for transportation;Help with stairs or ramp for entrance     Functional Status Assessment   Patient has had a recent decline in their functional status and demonstrates the ability to make significant improvements in function in a reasonable and predictable amount of time.     Equipment  Recommendations   BSC/3in1      Precautions/Restrictions   Precautions Precautions: Fall     Mobility Bed Mobility Overal bed mobility: Needs Assistance Bed Mobility: Supine to Sit, Sit to Supine     Supine to sit: Mod assist Sit to supine: Max assist        Transfers Overall transfer level: Needs assistance Equipment used: 1 person hand held assist Transfers: Sit to/from Stand Sit to Stand: Mod assist           General transfer comment: knees buckle in standing      Balance Overall balance assessment: Needs assistance Sitting-balance support: Feet supported Sitting balance-Leahy Scale: Good     Standing balance support: Single extremity supported Standing balance-Leahy Scale: Poor                             ADL either performed or assessed with clinical judgement   ADL Overall ADL's : Needs assistance/impaired     Grooming: Wash/dry face;Sitting;Minimal assistance                                       Vision Baseline Vision/History: 1 Wears glasses Patient Visual Report: No change from baseline              Pertinent Vitals/Pain Pain Assessment Pain Assessment: No/denies pain     Extremity/Trunk Assessment Upper Extremity Assessment Upper Extremity Assessment: Generalized weakness;LUE deficits/detail LUE Deficits / Details: decreased shoulder flexion to ~ 90 degrees from prior surgery  Lower Extremity Assessment Lower Extremity Assessment: Generalized weakness       Communication Communication Communication: No apparent difficulties   Cognition Arousal: Alert Behavior During Therapy: WFL for tasks assessed/performed Cognition: No apparent impairments                               Following commands: Intact       Cueing  General Comments   Cueing Techniques: Verbal cues              Home Living Family/patient expects to be discharged to:: Private residence Living  Arrangements: Spouse/significant other Available Help at Discharge: Family;Available PRN/intermittently Type of Home: House Home Access: Stairs to enter [employer] of Steps: 2 Entrance Stairs-Rails: None Home Layout: One level     Bathroom Shower/Tub: Producer, Television/film/video: Standard Bathroom Accessibility: Yes   Home Equipment: Shower seat;Grab bars - tub/shower;Rolling Environmental Consultant (2 wheels)          Prior Functioning/Environment Prior Level of Function : Needs assist             Mobility Comments: Pt uses RW for mobility ADLs Comments: Pt was able to do himself but recently needing more assistance secondary to weakness. He no longer has PCA to assist with shower.    OT Problem List: Decreased strength;Cardiopulmonary status limiting activity;Decreased activity tolerance;Impaired balance (sitting and/or standing);Decreased safety awareness   OT Treatment/Interventions: Self-care/ADL training;Balance training;Therapeutic exercise;Therapeutic activities;Patient/family education      OT Goals(Current goals can be found in the care plan section)   Acute Rehab OT Goals Patient Stated Goal: to get stronger OT Goal Formulation: With patient/family Time For Goal Achievement: 04/22/24 Potential to Achieve Goals: Fair ADL Goals Pt Will Perform Grooming: standing;with supervision Pt Will Perform Lower Body Dressing: with supervision Pt Will Transfer to Toilet: with supervision;ambulating Pt Will Perform Toileting - Clothing Manipulation and hygiene: with supervision;sit to/from stand   OT Frequency:  Min 2X/week       AM-PAC OT 6 Clicks Daily Activity     Outcome Measure Help from another person eating meals?: None Help from another person taking care of personal grooming?: A Little Help from another person toileting, which includes using toliet, bedpan, or urinal?: A Lot Help from another person bathing (including washing, rinsing, drying)?: A  Lot Help from another person to put on and taking off regular upper body clothing?: A Little Help from another person to put on and taking off regular lower body clothing?: A Lot 6 Click Score: 16   End of Session Nurse Communication: Mobility status  Activity Tolerance: Patient tolerated treatment well Patient left: in bed;with call bell/phone within reach;with bed alarm set  OT Visit Diagnosis: Muscle weakness (generalized) (M62.81);Unsteadiness on feet (R26.81)                Time: 9047-8987 OT Time Calculation (min): 20 min Charges:  OT General Charges $OT Visit: 1 Visit OT Evaluation $OT Eval Low Complexity: 1 Low OT Treatments $Therapeutic Activity: 8-22 mins  Izetta Claude, MS, OTR/L , CBIS ascom [phone]  04/08/24, 12:53 PM

## 2024-04-08 NOTE — ED Notes (Signed)
 [facility]  called  for  Dr  Leita Blanch  MD

## 2024-04-08 NOTE — ED Notes (Signed)
OT at BS.  

## 2024-04-08 NOTE — ED Notes (Signed)
 Pt sleeping, [place] not in nares. Returned to O2 HF [place]. VSS. SPO2 improved. Wife into room, at [facility].

## 2024-04-08 NOTE — Telephone Encounter (Signed)
 Pt following up in the ED

## 2024-04-08 NOTE — Progress Notes (Signed)
 [facility]  - [place] at [facility]   PATIENT NAME: [name]    MR#:  [id number]  DATE OF BIRTH:  [birth year]  SUBJECTIVE:   No family at bedside seen earlier in the ER left message for Mrs. Appenzeller. Came in with increasing shortness of breath swelling of upper and lower extremity along with diarrhea poor PO intake found to have creatinine of 5.1. Started on IV fluids and some Lasix  due to volume overload peripherally. [facility] list [facility] no beds yet   VITALS:  Blood pressure (!) 107/50, pulse 68, temperature 97.9 F (36.6 C), temperature source Oral, resp. rate 17, weight 114.6 kg, SpO2 97%.  PHYSICAL EXAMINATION:   GENERAL:  64 y.o.-year-old patient with mild acute distress. Obese LUNGS: distant breath sounds bilaterally, no wheezing CARDIOVASCULAR: S1, S2 normal. No murmur   ABDOMEN: Soft, nontender, nondistended. Bowel sounds present.  EXTREMITIES: anasarca   NEUROLOGIC: nonfocal  patient is alert and awake   LABORATORY PANEL:  CBC Recent Labs  Lab 04/08/24 0456  WBC 2.1*  HGB 11.0*  HCT 36.9*  PLT 72*    Chemistries  Recent Labs  Lab 04/07/24 1408 04/07/24 1929 04/07/24 2159 04/08/24 1435  NA 144  --    < > 142  K 4.3  --    < > 4.6  CL 104  --    < > 103  CO2 25  --    < > 27  GLUCOSE 121*  --    < > 232*  BUN 100*  --    < > 109*  CREATININE 4.75*  --    < > 5.55*  CALCIUM  8.9  --    < > 9.1  MG  --  2.9*  --   --   AST 26  --   --   --   ALT 16  --   --   --   ALKPHOS 203*  --   --   --   BILITOT 0.8  --   --   --    < > = values in this interval not displayed.   Cardiac Enzymes No results for input(s): TROPONINI in the last 168 hours. RADIOLOGY:  ECHOCARDIOGRAM COMPLETE Result Date: 04/08/2024    ECHOCARDIOGRAM REPORT   Patient Name:   [name]. Date of Exam: 04/08/2024 Medical Rec #:  [id number]               Height:       70.0 in Accession #:    [id number]              Weight:        252.6 lb Date of Birth:  Aug 21, 1960               BSA:          2.305 m Patient Age:    64 years                BP:           125/65 mmHg Patient Gender: M                       HR:           67 bpm. Exam Location:  [facility] Procedure: 2D Echo, Cardiac Doppler and Color Doppler (Both Spectral and Color            Flow Doppler were utilized  during procedure). Indications:     CHF-acute systolic I50.21  History:         Patient has prior history of Echocardiogram examinations, most                  recent 09/27/2023. CAD, Arrythmias:Atrial Fibrillation; Risk                  Factors:Hypertension. Aortic stenosis.  Sonographer:     Christopher Furnace Referring Phys:  [id number] [facility] Diagnosing Phys: Evalene Lunger MD IMPRESSIONS  1. Left ventricular ejection fraction, by estimation, is 40 to 45%. Left ventricular ejection fraction by PLAX is 48 %. The left ventricle has mildly decreased function. The left ventricle demonstrates regional wall motion abnormalities (septal motion consistent with bundle branch block).  2. The left ventricular internal cavity size was mildly dilated. There is mild left ventricular hypertrophy. Left ventricular diastolic parameters are indeterminate.  3. Right ventricular systolic function is low normal. The right ventricular size is mildly enlarged. There is moderately elevated pulmonary artery systolic pressure. The estimated right ventricular systolic pressure is 47.5 mmHg.  4. Left atrial size was moderately dilated.  5. The mitral valve is normal in structure. Mild to moderate mitral valve regurgitation. No evidence of mitral stenosis. The mean mitral valve gradient is 4.0 mmHg. Moderate mitral annular calcification.  6. The aortic valve is normal in structure. Aortic valve regurgitation is not visualized. No aortic stenosis is present.  7. The inferior vena cava is normal in size with greater than 50% respiratory variability, suggesting right atrial pressure of 3 mmHg. FINDINGS  Left Ventricle:  Left ventricular ejection fraction, by estimation, is 40 to 45%. Left ventricular ejection fraction by PLAX is 48 %. The left ventricle has mildly decreased function. The left ventricle demonstrates regional wall motion abnormalities. Strain was performed and the global longitudinal strain is indeterminate. The left ventricular internal cavity size was mildly dilated. There is mild left ventricular hypertrophy. Left ventricular diastolic parameters are indeterminate. Right Ventricle: The right ventricular size is mildly enlarged. No increase in right ventricular wall thickness. Right ventricular systolic function is low normal. There is moderately elevated pulmonary artery systolic pressure. The tricuspid regurgitant  velocity is 3.26 m/s, and with an assumed right atrial pressure of 5 mmHg, the estimated right ventricular systolic pressure is 47.5 mmHg. Left Atrium: Left atrial size was moderately dilated. Right At[facility]: Right atrial size was normal in size. Pericardium: There is no evidence of pericardial effusion. Mitral Valve: The mitral valve is normal in structure. Moderate mitral annular calcification. Mild to moderate mitral valve regurgitation. No evidence of mitral valve stenosis. MV peak gradient, 11.0 mmHg. The mean mitral valve gradient is 4.0 mmHg. Tricuspid Valve: The tricuspid valve is normal in structure. Tricuspid valve regurgitation is not demonstrated. No evidence of tricuspid stenosis. Aortic Valve: The aortic valve is normal in structure. Aortic valve regurgitation is not visualized. No aortic stenosis is present. Aortic valve mean gradient measures 12.7 mmHg. Aortic valve peak gradient measures 20.9 mmHg. Aortic valve area, by VTI measures 1.53 cm. Pulmonic Valve: The pulmonic valve was normal in structure. Pulmonic valve regurgitation is not visualized. No evidence of pulmonic stenosis. Aorta: The aortic root is normal in size and structure. Venous: The inferior vena cava is normal in size  with greater than 50% respiratory variability, suggesting right atrial pressure of 3 mmHg. IAS/Shunts: No atrial level shunt detected by color flow Doppler. Additional Comments: 3D was performed not requiring image post processing on  an independent workstation and was indeterminate.  LEFT VENTRICLE PLAX 2D LV EF:         Left            Diastology                ventricular     LV e' medial:   5.22 cm/s                ejection        LV E/e' medial: 30.3                fraction by                PLAX is 48                %. LVIDd:         6.10 cm LVIDs:         4.60 cm LV PW:         1.40 cm LV IVS:        1.40 cm LVOT diam:     2.00 cm LV SV:         70 LV SV Index:   30 LVOT Area:     3.14 cm  LV Volumes (MOD) LV vol d, MOD    180.0 ml A2C: LV vol d, MOD    86.1 ml A4C: LV vol s, MOD    161.0 ml A2C: LV vol s, MOD    66.8 ml A4C: LV SV MOD A2C:   19.0 ml LV SV MOD A4C:   86.1 ml LV SV MOD BP:    23.1 ml RIGHT VENTRICLE RV Basal diam:  4.10 cm RV Mid diam:    4.30 cm LEFT ATRIUM             Index        RIGHT ATRIUM           Index LA diam:        5.80 cm 2.52 cm/m   RA Area:     18.00 cm LA Vol (A2C):   84.6 ml 36.70 ml/m  RA Volume:   41.10 ml  17.83 ml/m LA Vol (A4C):   82.9 ml 35.97 ml/m LA Biplane Vol: 88.7 ml 38.48 ml/m  AORTIC VALVE AV Area (Vmax):    1.42 cm AV Area (Vmean):   1.45 cm AV Area (VTI):     1.53 cm AV Vmax:           228.67 cm/s AV Vmean:          164.667 cm/s AV VTI:            0.459 m AV Peak Grad:      20.9 mmHg AV Mean Grad:      12.7 mmHg LVOT Vmax:         103.00 cm/s LVOT Vmean:        76.200 cm/s LVOT VTI:          0.223 m LVOT/AV VTI ratio: 0.49  AORTA Ao Root diam: 3.00 cm MITRAL VALVE                TRICUSPID VALVE MV Area (PHT): 3.21 cm     TR Peak grad:   42.5 mmHg MV Area VTI:   2.20 cm     TR Vmax:        326.00 cm/s MV Peak grad:  11.0 mmHg MV Mean grad:  4.0 mmHg  SHUNTS MV Vmax:       1.66 m/s     Systemic VTI:  0.22 m MV Vmean:      87.6 cm/s    Systemic Diam:  2.00 cm MV Decel Time: 236 msec MV E velocity: 158.00 cm/s Evalene Lunger MD Electronically signed by Evalene Lunger MD Signature Date/Time: 04/08/2024/1:23:21 PM    Final    US  RENAL Result Date: 04/07/2024 EXAM: RETROPERITONEAL ULTRASOUND OF THE KIDNEYS 04/07/2024 07:43:14 PM TECHNIQUE: Real-time ultrasonography of the retroperitoneum, specifically the kidneys and urinary bladder, was performed. COMPARISON: None available. CLINICAL HISTORY: AKI (acute kidney injury) FINDINGS: RIGHT KIDNEY: The native right kidney is atrophic and echogenic in keeping with chronic renal insufficiency, and is largely obscured by overlying bowel gas. This is not well assessed on this examination. There is no hydronephrosis involving the visualized portion of the native right kidney. LEFT KIDNEY: The native left kidney is atrophic and echogenic in keeping with chronic renal insufficiency, and is largely obscured by overlying bowel gas. This is not well assessed on this examination. There is no hydronephrosis involving the visualized portion of the native left kidney. TRANSPLANT KIDNEY: Right lower quadrant transplant kidney is identified. The transplant kidney measures 12.1 x 6.5 x 6.7 cm. Volume: 273.6 cm. No hydronephrosis. No perinephric fluid collection. Normal cortical thickness and echogenicity. No intrarenal masses or calcifications are seen. Normal color flow vascularity. BLADDER: Limited images of the bladder are unremarkable. IMPRESSION: 1. Right lower quadrant transplant kidney without hydronephrosis or perinephric fluid collection. 2. Atrophic and echogenic native kidneys, consistent with chronic renal insufficiency, largely obscured by overlying bowel gas, with no hydronephrosis involving the visualized portions. Electronically signed by: Dorethia Molt MD 04/07/2024 07:49 PM EST RP Workstation: [id number]   DG Chest Portable 1 View Result Date: 04/07/2024 CLINICAL DATA:  Short of breath, cough EXAM: PORTABLE CHEST 1  VIEW COMPARISON:  01/26/2024 FINDINGS: Single frontal view of the chest demonstrates postsurgical changes from CABG. Cardiac silhouette remains enlarged. There is increased pulmonary vascular congestion with patchy bilateral airspace disease greatest at the left lung base. Trace bilateral pleural effusions. No pneumothorax. IMPRESSION: 1. Congestive heart failure, with worsening volume status since prior study. Electronically Signed   By: Ozell Daring M.D.   On: 04/07/2024 14:56    Assessment and Plan  Derl Sebasthian Stailey. is a 64 y.o. year old male with medical history of HTN, HLD, T2DM, CHF (EF 45%) and 09/2023, chronic atrial fibrillation, BPH, OSA, ESRD status post renal transplant presenting to the ED with worsening shortness of breath.  Patient has been having URI symptoms for about 10 days.  His symptoms include shortness of breath, coughing but patient denies any fevers or chills. States his breathing has worsened over the last 3 days. He states his medications were adjusted a few days ago.  He reports that he is holding onto fluid in his body and just needs lasix .  Denies any chest pain  per family patient had not been eating and drinking well for past few days.  His baseline creatinine is around 2.8-- 3.0 patient came in with creatinine of more than 5.0 workup in the ER showed clinical dehydration along with significant peripheral edema patient is on the transfer list to [facility] [place]  acute on chronic congestive heart failure diastolic with EF of 40 to 45% significant edema/anasarca -- started on IV Lasix  per Dr. Marcelino nephrology -- [facility] cardiology consultation with Dr. Gollan -- monitor input output -- holding losartan  and hydralazine   Acute on chronic kidney disease stage IIIB -- patient is deceased donor kidney transplant June 2012-- follows with Dr. DUFFY Molt [facility]rology [facility] [place] -- patient is on the transfer list however no beds --  acute kidney injury secondary to dehydration. Patient has had persistent diarrhea and poor PO intake for few days.- IV fluids at 40 mL an hour -- per nephrology continue outpatient immunosuppressive medications, prednisone , mycophenolate , tacrolimus - -- creat 4.9--5.18--5.55 -- further management per nephrology  A fib appears in sinus rhythm -- on eliquis  -- not on any rate blocking agent due to history of bradycardia  MDD -- takes Wellbutrin  at home  Type II diabetes -- sliding scale insulin  for now -- hold farxiga , gabapentin , mounjaro   Hypertension Holding home losartan  and hydralazine  as patient is getting IV diuresis.  Can restart hydralazine  but will hold losartan  until AKI resolves.   GERD: Continue home PPI   Hyperlipidemia: Continue home statin  Procedures: Family communication : left message for patient's wife Consults : nephrology, cardiology [facility] MG CODE STATUS: full DVT Prophylaxis : eliquis  Level of care: Progressive Status is: Inpatient Remains inpatient appropriate because: worsening chronic kidney disease    TOTAL TIME TAKING CARE OF THIS PATIENT: 45 minutes.  >50% time spent on counselling and coordination of care  Note: This dictation was prepared with Dragon dictation along with smaller phrase technology. Any transcriptional errors that result from this process are unintentional.  Leita Blanch M.D    [facility]   CC: Primary care physician; [name], MD

## 2024-04-08 NOTE — Consult Note (Signed)
 "  Cardiology Consult    Patient ID: [name]. MRN: [id number], DOB/AGE: [birth year]   Admit date: 04/07/2024 Date of Consult: 04/08/2024  Primary Physician: [name], MD Primary Cardiologist: Deatrice Cage, MD  Electrophysiologist:  OLE ONEIDA HOLTS, MD (Inactive)  Advanced Heart Failure:  Ezra Shuck, MD  Requesting Provider: GORMAN. Tobie, MD  Patient Profile    [name]. is a 64 y.o. male with a history of CAD status post CABG in 2005, permanent atrial fibrillation on Eliquis , end-stage renal disease s/p renal transplant 2012, ischemic cardiomyopathy, chronic heart failure with midrange ejection fraction, pulmonary hypertension, hypertension, hyperlipidemia, obesity status post gastric bypass, BPH, gout, and anemia, who is being seen today for the evaluation of acute on chronic heart failure in the setting of worsening renal function at the request of Dr. Tobie.  Past Medical History   Subjective  Past Medical History:  Diagnosis Date   Anal condyloma    Anemia    Anxiety    Aortic atherosclerosis    Aortic stenosis    a.) TTE 10/2021: Mod AS. AVA 1.06cm^2 (VTI). Mean grad ; b.) TTE 04/02/2022: no AV stenosis   Asthma, persistent controlled 02/25/2013   Attention or concentration deficit 04/26/2021   BPH with obstruction/lower urinary tract symptoms 09/25/2014   CAD (coronary artery disease) 2005   a.) LHC 1996 -> HG stenosis mLAD -> PTCA/PCI with stent x 1 (unk type) -> complicated by CFA pseudoanurysm (required surg);  b.) LHC 08/10/2002  -> 95% LAD, 70% ISR LAD, 80% pOM, 70% mRCA --> CVTS consult; c.) 4v CABG 08/13/2002; c.) 03/2018 MV: Small, fixed inferoapical and apical sep defect. No isc. EF 47% (60-65% by 05/2018 TTE); d.) 02/2021 MV: small Apical inf fixed defect. No isc -> low risk.   Cardiomegaly    Cellulitis and abscess of left leg 07/22/2020   Chronic atrial fibrillation    a.) CHA2DS2VASc = 4 (CHF, HTN, vascular disease  history, T2DM);  b.) rate/rhythm maintained without pharmacological intervention; chronically anticoagulated with apixaban    Chronic combined systolic and diastolic CHF (congestive heart failure)    a.) TTE 7/18 : EF 45-50%; b.) TTE 05/2018 : EF 60-65%, RVSP 74.8; c.) TTE 12/2018: EF 50-55%. Sev dil LA; d.) TTE 04/2019: EF 45-50%; e.) TTE 07/2021: EF 40-45%, G3DD; f.) TTE 10/2021: EF 40-45%, mild conc LVH, septal-lat dyssynchrony (LBBB), mildly red RVSF, mild-mod dil LA, mildly dil RA, mild-mod MS, mod AS; g.) TTE 04/02/2022: EF 40-45%, glob HK, LVH, red RVSF, RVE, sev LAE, mild-mod RAE, mild MR   Chronic venous stasis dermatitis of both lower extremities    Complicated UTI (urinary tract infection) 06/24/2022   Cytomegaloviral disease 2017   Diverticulosis of colon 04/22/2013   Elevated RIGHT hemidiaphragm    Erectile dysfunction    a.) on topical TRT + Trimix (alprostadil/papaverine/phentolamine) injections   ESRD (end stage renal disease) (HCC)    a.) dialysis dependent 2004-2012; b.) s/p cadaveric RIGHT renal transplant 09/13/2010   Essential hypertension 12/17/2010   Gout    History of 2019 novel coronavirus disease (COVID-19) 06/30/2019   History of bilateral cataract extraction 10/2017   History of renal transplant 09/13/2010   a.) s/p cadaveric donor transplant 09/13/2010   Hx of bilateral cataract extraction 10/2017   Hyperlipidemia    Hypogonadism in male 09/25/2014   Ischemic cardiomyopathy    Left cephalic vein thrombosis 06/2019   Long term current use of anticoagulant    a.) apixaban    Long term  current use of immunosuppressive drug    a.) mycophenolate  + prednisone  + tacrolimus    Major depressive disorder 10/04/2013   Mitral stenosis 11/07/2021   a.) TTE 11/07/2021: severe MAC, mild-mod MS (MPG 6 mmHg); b.) TTE 04/02/2022: no MV stenosis   Murmur    Obesity hypoventilation syndrome 03/31/2012   OSA treated with BiPAP 09/29/2012   PAH (pulmonary artery hypertension)    a.  04/2019 RHC: RA 19, RV 80/20, PA 78/31 (51), PCWP 25, CO/CI 7.69/3.26. PVR 3.25 --> Sev PAH, likely primarily PV HTN; b.) RHC 04/04/2022: mRA 13, mPA 40, mPCWP 20, PA sat 59, AO sat 92, CO 7.63, CI 3.26, PVR 2.6 --> mod portal venous HTN   Pancreatitis    S/P CABG x 4 08/13/2002   a.) LIMA-LAD, LRA-OM, SVG-D1, SVG-dRCA   S/P gastric bypass    Synovitis of finger 06/11/2019   Trigger finger, unspecified little finger 12/23/2018   Type 2 diabetes mellitus with hyperglycemia, with long-term current use of insulin  09/09/2014   a.) has FreeStyle Libre CGM   Ulcer    Left shin goes to wound care center at [facility] qweek   Wears glasses    Wears hearing aid in both ears     Past Surgical History:  Procedure Laterality Date   CARDIAC CATHETERIZATION N/A 08/10/2002   CATARACT EXTRACTION W/PHACO Right 10/29/2017   Procedure: CATARACT EXTRACTION PHACO AND INTRAOCULAR LENS PLACEMENT (IOC)  RIGHT DIABETIC;  Surgeon: Mittie Gaskin, MD;  Location: [facility];  Service: Ophthalmology;  Laterality: Right   CATARACT EXTRACTION W/PHACO Left 11/18/2017   Procedure: CATARACT EXTRACTION PHACO AND INTRAOCULAR LENS PLACEMENT (IOC) LEFT IVA/TOPICAL;  Surgeon: Mittie Gaskin, MD;  Location: [facility];  Service: Ophthalmology;  Laterality: Left   COLONOSCOPY WITH PROPOFOL  N/A 04/22/2013   Procedure: COLONOSCOPY WITH PROPOFOL ;  Surgeon: Toribio SHAUNNA Cedar, MD;  Location: [facility];  Service: Endoscopy;  Laterality: N/A;   CORONARY ANGIOPLASTY WITH STENT PLACEMENT N/A 1996   CORONARY ARTERY BYPASS GRAFT N/A 08/13/2002   Procedure: 4v CORONARY ARTERY BYPASS GRAFTING; Location: [facility]; Surgeon: Dessa Laine, MD   CYSTOSCOPY WITH INSERTION OF UROLIFT N/A 12/25/2021   Procedure: CYSTOSCOPY WITH INSERTION OF UROLIFT;  Surgeon: Twylla Glendia BROCKS, MD;  Location: [facility];  Service: Urology;  Laterality: N/A;   DG ANGIO AV SHUNT*L*     right and left upper arms   FASCIOTOMY  03/03/2012    Procedure: FASCIOTOMY;  Surgeon: Arley JONELLE Curia, MD;  Location: [facility];  Service: Orthopedics;  Laterality: Right;  FASCIOTOMY RIGHT SMALL FINGER   FASCIOTOMY Left 08/17/2013   Procedure: FASCIOTOMY LEFT RING;  Surgeon: Arley JONELLE Curia, MD;  Location: [facility];  Service: Orthopedics;  Laterality: Left;   INCISION AND DRAINAGE ABSCESS Left 10/15/2015   Procedure: INCISION AND DRAINAGE ABSCESS;  Surgeon: Laneta JULIANNA Luna, MD;  Location: [facility];  Service: General;  Laterality: Left;   KIDNEY TRANSPLANT  09/13/2010   cadaver--at [facility]   LASER ABLATION CONDOLAMATA N/A 01/08/2023   Procedure: LASER REMOVAL ABLATION OF CONDYLOMATA;  Surgeon: Sheldon Standing, MD;  Location: [facility];  Service: General;  Laterality: N/A;  GEN AND LOCAL   LASER ABLATION CONDOLAMATA N/A 11/20/2023   Procedure: ABLATION, CONDYLOMA, USING LASER HEMORRHOIDAL LIGATION & HEMORRHOIDOPEXY, HEMORRHOIDECTOMY x 2;  Surgeon: Sheldon Standing, MD;  Location: [facility];  Service: General;  Laterality: N/A;  LASER REMOVAL ABLATION OF CONDYLOMATA ANORECTAL EXAMINATION UNDER ANESTHESIA   RECTAL EXAM UNDER ANESTHESIA N/A 01/08/2023  Procedure: ANORECTAL EXAM UNDER ANESTHESIA;  Surgeon: Sheldon Standing, MD;  Location: [facility];  Service: General;  Laterality: N/A;   RECTAL EXAM UNDER ANESTHESIA N/A 11/20/2023   Procedure: EXAM UNDER ANESTHESIA, RECTUM;  Surgeon: Sheldon Standing, MD;  Location: [facility];  Service: General;  Laterality: N/A;   RIGHT HEART CATH N/A 11/15/2016   Procedure: RIGHT HEART CATH;  Surgeon: Darron Deatrice LABOR, MD;  Location: [facility];  Service: Cardiovascular;  Laterality: N/A;   RIGHT HEART CATH N/A 05/03/2019   Procedure: RIGHT HEART CATH;  Surgeon: [name], MD;  Location: [name];  Service: Cardiovascular;  Laterality: N/A;   RIGHT HEART CATH N/A 04/04/2022   Procedure: RIGHT HEART CATH;  Surgeon: [name], MD;  Location: [facility] CV [facility];  Service:  Cardiovascular;  Laterality: N/A;   ROUX-EN-Y GASTRIC BYPASS N/A 2015   TYMPANIC MEMBRANE REPAIR Left 03/2010   VASECTOMY     WART FULGURATION N/A 05/31/2022   Procedure: FULGURATION ANAL WART;  Surgeon: Tye Millet, DO;  Location: [facility];  Service: General;  Laterality: N/A;     Allergies  Allergies[1]     History of Present Illness   64 year old male with a history of CAD status post CABG in 2005, permanent atrial fibrillation on Eliquis , end-stage renal disease s/p renal transplant 2012, ischemic cardiomyopathy, chronic heart failure with midrange ejection fraction, pulmonary hypertension, hypertension, hyperlipidemia, obesity status post gastric bypass, BPH, gout, and anemia.  EF previously as low as 35 to 40% with subsequent improvement to 60 to 65% by echo in March 2021.  In the setting of chest pain, he underwent stress testing in December 2022, which was overall low risk with evidence of prior infarct in the inferoapical area with no significant ischemia.  GDMT was optimized.  In May 2023, he was admitted with recurrent heart failure and AKI and EF showed mild reduction to 40-45%.  He underwent event monitoring in December 2024 in the setting of bradycardia, which showed an average heart rate in the 50s without any significant bradycardia arrhythmias.  AV nodal blocking agents have been avoided.  He was admitted in July 2025 for mechanical fall and severe persistent epistaxis requiring holding of Eliquis  and nasal packing by ENT.  Initial head CT demonstrated small volume hemorrhage layering in the maxillary sinuses and fluid in the visible pharynx.  Subsequently had acute change in mental status.  Repeat head CT was negative.  MRI of the brain showed mild, nonspecific sinus disease and no evidence of stroke.  EEG showed no evidence of seizure.  Symptoms subsequently resolved.  Echo showed an EF was 40 to 45%.  He was maintained on GDMT and followed by heart failure in the outpatient  setting, last seen in November 2025.  In the outpatient setting, he has been taking torsemide  100 mg twice daily and metolazone  once a week as needed.  Approximately 7-10 days ago, pt developed upper resp congestion w/ productive cough, which was initially clear but subsequently became darker - black/brown.  He did not seek medical care and managed it at home conservatively.  He did not use any OTC cold meds.  Resp symptoms began improving however his appetite waned and his wt started rising.  In that setting, he noted increasing abdominal bloating, which he identified as fluid retention.  He denies any lower extremity edema or chest pain, but did develop orthopnea.  His urine output declined and his urine color darkened.  He discussed w/  his nephrologist and had labs on 1/9, revealing worsening of renal fxn w/ BUN/creat of 88/3.42.  There was concern for possible dehydration.  Over past 48 hrs, he has noted progressive dyspnea and abdominal bloating.  He also noted some shaking and was feeling off balanced.  He was becoming concerned that he was becoming septic, and for that reason, he presented to the [facility] emergency department on January 14.  Here, he was afebrile and normotensive.  He was saturating at 98% on 6 L/min.  Lab work notable for H&H of 11.3/38.1, white count of 4.5, AKI with a BUN/creatinine 100/4.75, pro BNP greater than 35,000, and mild troponin elevations at 129  118.  Resp screen + for rhinovirus.  Urinalysis concerning for UTI.  Chest x-ray showed congestive heart failure.  Renal duplex showed no hydronephrosis or perinephric fluid collection involving the right lower quadrant transplanted kidney.  He was given Lasix  160 mg x 1 followed by 80 mg IV twice daily, as well as a dose of Solu-Medrol  due to AKI and concerns related to possible rejection.  Ceftriaxone  was added due to concern for UTI.  Patient remains dyspneic with speech and mildly orthopneic.  He denies any recent history of chest  pain.  Meds, FH, SH, ROS    Subjective   Inpatient Medications    apixaban   5 mg Oral BID   furosemide   80 mg Intravenous BID   insulin  aspart  0-5 Units Subcutaneous QHS   insulin  aspart  0-9 Units Subcutaneous TID WC   mycophenolate   360 mg Oral q morning   And   mycophenolate   180 mg Oral QHS   predniSONE   5 mg Oral Daily   sodium chloride  flush  3 mL Intravenous Q12H   tacrolimus   1 mg Oral QHS   tacrolimus   2 mg Oral q morning   tamsulosin   0.4 mg Oral Daily    Family History    Family History  Problem Relation Age of Onset   Heart disease Father    Kidney failure Father    Kidney disease Father    Diabetes Maternal Grandmother    Breast cancer Maternal Grandmother    Valvular heart disease Mother    Liver cancer Paternal Uncle    Liver cancer Paternal Grandmother    Prostate cancer Neg Hx    He indicated that his mother is deceased. He indicated that his father is deceased. He indicated that both of his sisters are alive. He indicated that two of his three brothers are alive. He indicated that his maternal grandmother is deceased. He indicated that the status of his paternal grandmother is unknown. He indicated that both of his sons are alive. He indicated that the status of his paternal uncle is unknown. He indicated that the status of his neg hx is unknown.   Social History    Social History   Socioeconomic History   Marital status: Married    Spouse name: Not on file   Number of children: 2   Years of education: 14   Highest education level: Associate degree: academic program  Occupational History   Occupation: Presenter, Broadcasting: unemployed    Comment: disabled due to kidney failure  Tobacco Use   Smoking status: Never   Smokeless tobacco: Never  Vaping Use   Vaping status: Never Used  Substance and Sexual Activity   Alcohol use: Not Currently    Comment: occ   Drug use: No   Sexual activity:  Not on file  Other Topics  Concern   Not on file  Social History Narrative   Has living will   Wife is health care POA---son Donnice is alternate   Would accept resuscitation attempts   No tube feedings if cognitively unaware   Social Drivers of Health   Tobacco Use: Low Risk (04/07/2024)   Patient History    Smoking Tobacco Use: Never    Smokeless Tobacco Use: Never    Passive Exposure: Never  Recent Concern: Tobacco Use - Medium Risk (03/16/2024)   Received from [facility]   Patient History    Smoking Tobacco Use: Former    Smokeless Tobacco Use: Never    Passive Exposure: Past  Physicist, Medical Strain: Patient Declined (12/09/2023)   Overall Financial Resource Strain (CARDIA)    Difficulty of Paying Living Expenses: Patient declined  Food Insecurity: Low Risk (03/05/2024)   Received from [facility]   Epic    Within the past 12 months, you worried that your food would run out before you got money to buy more: Never true    Within the past 12 months, the food you bought just didn't last and you didn't have money to get more. : Never true  Transportation Needs: No Transportation Needs (03/05/2024)   Received from [facility]    In the past 12 months, has lack of reliable transportation kept you from medical appointments, meetings, work or from getting things needed for daily living? : No  Physical Activity: Not on file  Stress: Not on file  Social Connections: Socially Integrated (04/11/2023)   Social Connection and Isolation Panel    Frequency of Communication with Friends and Family: More than three times a week    Frequency of Social Gatherings with Friends and Family: More than three times a week    Attends Religious Services: More than 4 times per year    Active Member of [employer] or Organizations: Yes    Attends Banker Meetings: More than 4 times per year    Marital Status: Married  Catering Manager Violence: Not At Risk (11/22/2023)   Epic     Fear of Current or Ex-Partner: No    Emotionally Abused: No    Physically Abused: No    Sexually Abused: No  Depression (PHQ2-9): Low Risk (02/09/2024)   Depression (PHQ2-9)    PHQ-2 Score: 3  Alcohol Screen: Not on file  Housing: Low Risk (03/05/2024)   Received from [facility]   Epic    What is your living situation today?: I have a steady place to live    Think about the place you live. Do you have problems with any of the following? Choose all that apply:: None/None on this list  Utilities: Low Risk (03/05/2024)   Received from [facility]   Utilities    In the past 12 months has the electric, gas, oil, or water  company threatened to shut off services in your home? : No  Health Literacy: Not on file     Review of Systems    General:  No chills, fever, night sweats or weight changes.  Cardiovascular:  No chest pain, +++ dyspnea on exertion, +++ abd bloating, no LE edema, +++ orthopnea, no palpitations, paroxysmal nocturnal dyspnea. Dermatological: No rash, lesions/masses Respiratory: +++ productive cough, +++ dyspnea Urologic: No hematuria, dysuria.  Decreased UO. Abdominal:   No nausea, vomiting, diarrhea, bright red blood per rectum, melena, or hematemesis Neurologic:  No  visual changes, wkns, changes in mental status. All other systems reviewed and are otherwise negative except as noted above.     Exam, Labs, Other  Objective   Physical Exam    Blood pressure (!) 106/55, pulse 64, temperature 97.9 F (36.6 C), temperature source Oral, resp. rate 14, weight 114.6 kg, SpO2 95%.  General: Pleasant, NAD Psych: Normal affect. Neuro: Alert and oriented X 3. Moves all extremities spontaneously. HEENT: Normal  Neck: Supple, obese, difficult to gauge JVP.  No bruits or masses. Lungs:  Resp regular and unlabored, coarse breath sounds bilat. Bibasilar crackles. Heart: IR, IR, no s3, s4, or murmurs. Abdomen: Soft, non-tender, non-distended, BS + x 4.  Extremities: No  clubbing, cyanosis or edema. DP/PT2+, Radials 2+ and equal bilaterally.  Labs    Recent Labs  Lab 04/07/24 1408 04/07/24 1929  TRNPT 129* 118*  ] BNP    Component Value Date/Time   BNP 585.6 (H) 02/17/2024 1145   BNP 544.9 (H) 01/26/2024 1024    ProBNP    Component Value Date/Time   PROBNP >35,000.0 (H) 04/07/2024 1408   PROBNP 4,402 (H) 10/21/2022 1214    Lab Results  Component Value Date   WBC 2.1 (L) 04/08/2024   HGB 11.0 (L) 04/08/2024   HCT 36.9 (L) 04/08/2024   MCV 103.1 (H) 04/08/2024   PLT 72 (L) 04/08/2024    Recent Labs  Lab 04/07/24 1408 04/07/24 2159 04/08/24 0456  NA 144   < > 142  K 4.3   < > 4.6  CL 104   < > 102  CO2 25   < > 26  BUN 100*   < > 106*  CREATININE 4.75*   < > 5.18*  CALCIUM  8.9   < > 9.0  PROT 7.3  --   --   BILITOT 0.8  --   --   ALKPHOS 203*  --   --   ALT 16  --   --   AST 26  --   --   GLUCOSE 121*   < > 171*   < > = values in this interval not displayed.   Lab Results  Component Value Date   CHOL 72 (L) 08/21/2023   HDL 37 (L) 08/21/2023   LDLCALC 18 08/21/2023   TRIG 83 08/21/2023     Radiology Studies    US  RENAL Result Date: 04/07/2024 EXAM: RETROPERITONEAL ULTRASOUND OF THE KIDNEYS 04/07/2024 07:43:14 PM TECHNIQUE: Real-time ultrasonography of the retroperitoneum, specifically the kidneys and urinary bladder, was performed. COMPARISON: None available. CLINICAL HISTORY: AKI (acute kidney injury) FINDINGS: RIGHT KIDNEY: The native right kidney is atrophic and echogenic in keeping with chronic renal insufficiency, and is largely obscured by overlying bowel gas. This is not well assessed on this examination. There is no hydronephrosis involving the visualized portion of the native right kidney. LEFT KIDNEY: The native left kidney is atrophic and echogenic in keeping with chronic renal insufficiency, and is largely obscured by overlying bowel gas. This is not well assessed on this examination. There is no hydronephrosis  involving the visualized portion of the native left kidney. TRANSPLANT KIDNEY: Right lower quadrant transplant kidney is identified. The transplant kidney measures 12.1 x 6.5 x 6.7 cm. Volume: 273.6 cm. No hydronephrosis. No perinephric fluid collection. Normal cortical thickness and echogenicity. No intrarenal masses or calcifications are seen. Normal color flow vascularity. BLADDER: Limited images of the bladder are unremarkable. IMPRESSION: 1. Right lower quadrant transplant kidney without hydronephrosis or perinephric fluid collection.  2. Atrophic and echogenic native kidneys, consistent with chronic renal insufficiency, largely obscured by overlying bowel gas, with no hydronephrosis involving the visualized portions. Electronically signed by: Dorethia Molt MD 04/07/2024 07:49 PM EST RP Workstation: [id number]   DG Chest Portable 1 View Result Date: 04/07/2024 CLINICAL DATA:  Short of breath, cough EXAM: PORTABLE CHEST 1 VIEW COMPARISON:  01/26/2024 FINDINGS: Single frontal view of the chest demonstrates postsurgical changes from CABG. Cardiac silhouette remains enlarged. There is increased pulmonary vascular congestion with patchy bilateral airspace disease greatest at the left lung base. Trace bilateral pleural effusions. No pneumothorax. IMPRESSION: 1. Congestive heart failure, with worsening volume status since prior study. Electronically Signed   By: Ozell Daring M.D.   On: 04/07/2024 14:56      ECG & Cardiac Imaging    Atrial fibrillation, 83, left bundle branch block- personally reviewed.  Assessment & Plan    1.  Acute on chronic heart failure with midrange ejection fraction/ischemic cardiomyopathy: Most recent echo in July 2025 showed persistent, mild LV dysfunction with an EF of 40 to 45%.  Approximately 7 to 10 days ago, patient developed upper respiratory symptoms with productive cough and brown sputum which he managed conservatively, followed by progressive weight gain, increased  abdominal bloating, reduction in urine output, dark urine, and finding of AKI on outpatient labs through his nephrologist office.  He presented to the ED due to worsening dyspnea and chills/shakes, with concern for sepsis.  Here, proBNP greater than 35,000 and chest x-ray shows CHF.  On examination, hisabdomen is full and he has coarse breath sounds with basilar crackles.  He was treated with a 160 mg of intravenous Lasix  on presentation and has since been on 80 IV twice daily.  Renal ultrasound does not show any hydronephrosis or perinephric fluid collection surrounding the right lower quadrant transplanted kidney.  With diuretic therapy, urine output has improved some however, BUN and creatinine up higher to 106 and 5.18.  Awaiting nephrology evaluation with possibility of transfer to a tertiary center given transplant history.  Defer diuretic management to nephrology team.  At this time, heart failure GDMT on hold including Farxiga  and lisinopril .  Reevaluate pending improvement in renal function.  2.  Coronary artery disease: Status post prior CABG with nonischemic Myoview  in December 2022.  Patient has not been experiencing chest pain at home.  ECG without acute ST or T changes.  Upon presentation with volume overload and AKI, troponins mildly elevated at 129 with a follow-up value of 118.  Suspect demand ischemia in the setting of heart failure and AKI.  Follow-up limited echo.  No aspirin  in the setting of Eliquis .  No beta-blocker in the setting of history of bradycardia.  Home dose of statin was held on admission.  Recommend resumption when okay with primary team.  3.  Permanent atrial fibrillation: Rate controlled in the absence of AV nodal blocking agents due to history of bradycardia.  He is critical anticoagulated with Eliquis .  4.  End-stage renal disease s/p renal transplant/acute kidney injury: Progressive worsening renal function over the past 2 weeks with reduction in urine output in the  outpatient setting.  Currently being treated for UTI.  Nephrology following.  5.  Primary hypertension: Blood pressure stable on presentation.  ACE inhibitor on hold due to AKI.  6.  Hyperlipidemia: LDL at goal-18 in May 2025.  On rosuvastatin  in the outpatient setting.  7.  Macrocytic anemia: Stable.  8.  UTI: Ceftriaxone  per primary team.  Risk Assessment/Risk  Scores:     TIMI Risk Score for Unstable Angina or Non-ST Elevation MI:   The patient's TIMI risk score is 3, which indicates a 13% risk of all cause mortality, new or recurrent myocardial infarction or need for urgent revascularization in the next 14 days.  New [name]  Heart Association (NYHA) Functional Class NYHA Class IV  CHA2DS2-VASc Score = 4   This indicates a 4.8% annual risk of stroke. The patient's score is based upon: CHF History: 1 HTN History: 1 Diabetes History: 1 Stroke History: 0 Vascular Disease History: 1 Age Score: 0 Gender Score: 0     Signed, Lonni Meager, NP 04/08/2024, 1:44 PM  For questions or updates, please contact   Please consult [email or web address] for contact info under Cardiology/STEMI.       [1]  Allergies Allergen Reactions   Iodinated Contrast Media Other (See Comments)    Kidney transplant  Iodinated contrast media (substance)   Iodine Other (See Comments)    Kidney transplant   Nsaids Other (See Comments)    Kidney transplant  Kidney transplant    Non-steroidal anti-inflammatory agent (substance)   Other Other (See Comments)    Non-steroidal anti-inflammatory agent (substance)   Ibuprofen Other (See Comments)    Due to kidney transplant   "

## 2024-04-08 NOTE — ED Notes (Signed)
 MD x2 into room, at [facility].

## 2024-04-08 NOTE — ED Notes (Signed)
 Pt had removed self from O2 NRB 8L. Desatted. RN and wife at [facility]. Pt alert, NAD, calm, interactive. Alert, NAD, calm, interactive, oriented, skin W&D. RT into room. Returned to monitor and NRB 10L. SPO2 back to 98%. Renal MD into room, at [facility]. PT/family updated. Denies pain.

## 2024-04-08 NOTE — Progress Notes (Signed)
*  PRELIMINARY RESULTS* Echocardiogram 2D Echocardiogram has been performed.  [name] 04/08/2024, 10:59 AM

## 2024-04-08 NOTE — Consult Note (Signed)
 WOC Nurse Consult Note: Reason for Consult:Nonhealing wound to right anterior lower leg in the setting of fluid overload.  Is being diuresed with IV lasix .  Wound type: nonhealing trauma Pressure Injury POA: NA Measurement: 3 open areas with patches of intact skin between.  Total area is 2 cmx 4 cm with each open area 0.5 cm round Wound bed: ruddy red Drainage (amount, consistency, odor) minimal serosanguinous   Periwound: dry skin, edema to lower legs Dressing procedure/placement/frequency:  Bedside RN to cleanse wound to right lower leg with VASHE cleanser ITEM # [id number]  Apply Xeroform gauze to open area.  Cover with gauze and kerlix/tape.  Change daily.  Will not follow at this time.  Please re-consult if needed.  Darice Cooley MSN, RN, FNP-BC CWON Wound, Ostomy, Continence Nurse Outpatient [facility] [phone] Work cell phone:  [phone]

## 2024-04-09 ENCOUNTER — Encounter: Payer: Self-pay | Admitting: Internal Medicine

## 2024-04-09 DIAGNOSIS — I2510 Atherosclerotic heart disease of native coronary artery without angina pectoris: Secondary | ICD-10-CM

## 2024-04-09 DIAGNOSIS — I509 Heart failure, unspecified: Secondary | ICD-10-CM

## 2024-04-09 DIAGNOSIS — I255 Ischemic cardiomyopathy: Secondary | ICD-10-CM

## 2024-04-09 DIAGNOSIS — I4821 Permanent atrial fibrillation: Secondary | ICD-10-CM | POA: Diagnosis not present

## 2024-04-09 DIAGNOSIS — N185 Chronic kidney disease, stage 5: Secondary | ICD-10-CM | POA: Diagnosis not present

## 2024-04-09 DIAGNOSIS — J9601 Acute respiratory failure with hypoxia: Secondary | ICD-10-CM | POA: Diagnosis not present

## 2024-04-09 DIAGNOSIS — Z951 Presence of aortocoronary bypass graft: Secondary | ICD-10-CM | POA: Diagnosis not present

## 2024-04-09 DIAGNOSIS — I5020 Unspecified systolic (congestive) heart failure: Secondary | ICD-10-CM

## 2024-04-09 DIAGNOSIS — E1169 Type 2 diabetes mellitus with other specified complication: Secondary | ICD-10-CM

## 2024-04-09 DIAGNOSIS — I482 Chronic atrial fibrillation, unspecified: Secondary | ICD-10-CM | POA: Diagnosis not present

## 2024-04-09 DIAGNOSIS — E785 Hyperlipidemia, unspecified: Secondary | ICD-10-CM

## 2024-04-09 DIAGNOSIS — N170 Acute kidney failure with tubular necrosis: Secondary | ICD-10-CM | POA: Diagnosis not present

## 2024-04-09 LAB — CBG MONITORING, ED
Glucose-Capillary: 136 mg/dL — ABNORMAL HIGH (ref 70–99)
Glucose-Capillary: 176 mg/dL — ABNORMAL HIGH (ref 70–99)
Glucose-Capillary: 208 mg/dL — ABNORMAL HIGH (ref 70–99)
Glucose-Capillary: 197 mg/dL — ABNORMAL HIGH (ref 70–99)

## 2024-04-09 LAB — URINE CULTURE: Culture: NO GROWTH

## 2024-04-09 LAB — BASIC METABOLIC PANEL WITH GFR
Anion gap: 13 (ref 5–15)
BUN: 121 mg/dL — ABNORMAL HIGH (ref 8–23)
CO2: 26 mmol/L (ref 22–32)
Calcium: 8.9 mg/dL (ref 8.9–10.3)
Chloride: 103 mmol/L (ref 98–111)
Creatinine, Ser: 6.42 mg/dL — ABNORMAL HIGH (ref 0.61–1.24)
GFR, Estimated: 9 mL/min — ABNORMAL LOW
Glucose, Bld: 207 mg/dL — ABNORMAL HIGH (ref 70–99)
Potassium: 4.9 mmol/L (ref 3.5–5.1)
Sodium: 141 mmol/L (ref 135–145)

## 2024-04-09 LAB — UREA NITROGEN, URINE: Urea Nitrogen, Ur: 143 mg/dL

## 2024-04-09 LAB — HEPATITIS B SURFACE ANTIGEN: Hepatitis B Surface Ag: NONREACTIVE

## 2024-04-09 LAB — GLUCOSE, CAPILLARY: Glucose-Capillary: 187 mg/dL — ABNORMAL HIGH (ref 70–99)

## 2024-04-09 MED ORDER — OXYCODONE-ACETAMINOPHEN 10-325 MG PO TABS: 1.0000 | ORAL_TABLET | Freq: Four times a day (QID) | ORAL | Status: DC | PRN

## 2024-04-09 MED ORDER — ACETAMINOPHEN 325 MG PO TABS
325.0000 mg | ORAL_TABLET | Freq: Four times a day (QID) | ORAL | Status: DC | PRN
  Administered 2024-04-11: 325 mg via ORAL
  Filled 2024-04-09: qty 1

## 2024-04-09 MED ORDER — CHLORHEXIDINE GLUCONATE CLOTH 2 % EX PADS
6.0000 | MEDICATED_PAD | Freq: Every day | CUTANEOUS | Status: DC
  Administered 2024-04-10 – 2024-04-12 (×3): 6 via TOPICAL
  Filled 2024-04-09: qty 6

## 2024-04-09 MED ORDER — IPRATROPIUM-ALBUTEROL 0.5-2.5 (3) MG/3ML IN SOLN
RESPIRATORY_TRACT | Status: AC
  Filled 2024-04-09: qty 3

## 2024-04-09 MED ORDER — OXYCODONE HCL 5 MG PO TABS
10.0000 mg | ORAL_TABLET | Freq: Four times a day (QID) | ORAL | Status: DC | PRN
  Administered 2024-04-09 – 2024-04-11 (×3): 10 mg via ORAL
  Filled 2024-04-09 (×4): qty 2

## 2024-04-09 NOTE — Progress Notes (Signed)
" °   04/09/24 1700  Vitals  Temp 97.8 F (36.6 C)  Temp Source Oral  BP (!) 106/43  MAP (mmHg) (!) 62  BP Location Right Arm  BP Method Automatic  Patient Position (if appropriate) Lying  Pulse Rate 72  Pulse Rate Source Monitor  ECG Heart Rate 71  Resp 15  Oxygen  Therapy  SpO2 98 %  During Treatment Monitoring  Blood Flow Rate (mL/min) 0 mL/min  Arterial Pressure (mmHg) -3.63 mmHg  Venous Pressure (mmHg) -2.02 mmHg  TMP (mmHg) -10.1 mmHg  Ultrafiltration Rate (mL/min) 300 mL/min  Dialysate Flow Rate (mL/min) 299 ml/min  Duration of HD Treatment -hour(s) 0.41 hour(s)  Cumulative Fluid Removed (mL) per Treatment  11.87  HD Safety Checks Performed Yes  Intra-Hemodialysis Comments Tx completed  Post Treatment  Dialyzer Clearance Heavily streaked  Hemodialysis Intake (mL) 0 mL  Liters Processed 12  Fluid Removed (mL) 0 mL  Tolerated HD Treatment  (see progress note)  Post-Hemodialysis Comments see progress note  AVG/AVF Arterial Site Held (minutes) 5 minutes  AVG/AVF Venous Site Held (minutes) 5 minutes    "

## 2024-04-09 NOTE — Progress Notes (Signed)
 [facility]  - [place] at [facility]   PATIENT NAME: [name]    MR#:  [id number]  DATE OF BIRTH:  Nov 07, 1960  SUBJECTIVE:   Pt's wife at bedside  Sitting out int he chair. Vitals better. Poor UOP. Planning for HD per nephrology VITALS:  Blood pressure 121/70, pulse 74, temperature 97.8 F (36.6 C), temperature source Oral, resp. rate 17, weight 114.6 kg, SpO2 95%.  PHYSICAL EXAMINATION:   GENERAL:  64 y.o.-year-old patient with mild acute distress. Obese, chronically ill LUNGS: distant breath sounds bilaterally, no wheezing CARDIOVASCULAR: S1, S2 normal. No murmur   ABDOMEN: Soft, nontender, nondistended. Bowel sounds present.  EXTREMITIES: anasarca  , bilateral LE venous stasis with hyperpigmentation NEUROLOGIC: nonfocal  patient is alert and awake   LABORATORY PANEL:  CBC Recent Labs  Lab 04/08/24 0456  WBC 2.1*  HGB 11.0*  HCT 36.9*  PLT 72*    Chemistries  Recent Labs  Lab 04/07/24 1408 04/07/24 1929 04/07/24 2159 04/08/24 2140  NA 144  --    < > 141  K 4.3  --    < > 4.7  CL 104  --    < > 103  CO2 25  --    < > 24  GLUCOSE 121*  --    < > 232*  BUN 100*  --    < > 116*  CREATININE 4.75*  --    < > 5.96*  CALCIUM  8.9  --    < > 8.6*  MG  --  2.9*  --   --   AST 26  --   --   --   ALT 16  --   --   --   ALKPHOS 203*  --   --   --   BILITOT 0.8  --   --   --    < > = values in this interval not displayed.   Cardiac Enzymes No results for input(s): TROPONINI in the last 168 hours. RADIOLOGY:  ECHOCARDIOGRAM COMPLETE Result Date: 04/08/2024    ECHOCARDIOGRAM REPORT   Patient Name:   [name]. Date of Exam: 04/08/2024 Medical Rec #:  [id number]               Height:       70.0 in Accession #:    [id number]              Weight:       252.6 lb Date of Birth:  07/26/1960               BSA:          2.305 m Patient Age:    63 years                BP:           125/65 mmHg Patient Gender: M                       HR:            67 bpm. Exam Location:  [facility] Procedure: 2D Echo, Cardiac Doppler and Color Doppler (Both Spectral and Color            Flow Doppler were utilized during procedure). Indications:     CHF-acute systolic I50.21  History:         Patient has prior history of Echocardiogram examinations, most  recent 09/27/2023. CAD, Arrythmias:Atrial Fibrillation; Risk                  Factors:Hypertension. Aortic stenosis.  Sonographer:     Christopher Furnace Referring Phys:  [id number] [facility] Diagnosing Phys: Evalene Lunger MD IMPRESSIONS  1. Left ventricular ejection fraction, by estimation, is 40 to 45%. Left ventricular ejection fraction by PLAX is 48 %. The left ventricle has mildly decreased function. The left ventricle demonstrates regional wall motion abnormalities (septal motion consistent with bundle branch block).  2. The left ventricular internal cavity size was mildly dilated. There is mild left ventricular hypertrophy. Left ventricular diastolic parameters are indeterminate.  3. Right ventricular systolic function is low normal. The right ventricular size is mildly enlarged. There is moderately elevated pulmonary artery systolic pressure. The estimated right ventricular systolic pressure is 47.5 mmHg.  4. Left atrial size was moderately dilated.  5. The mitral valve is normal in structure. Mild to moderate mitral valve regurgitation. No evidence of mitral stenosis. The mean mitral valve gradient is 4.0 mmHg. Moderate mitral annular calcification.  6. The aortic valve is normal in structure. Aortic valve regurgitation is not visualized. No aortic stenosis is present.  7. The inferior vena cava is normal in size with greater than 50% respiratory variability, suggesting right atrial pressure of 3 mmHg. FINDINGS  Left Ventricle: Left ventricular ejection fraction, by estimation, is 40 to 45%. Left ventricular ejection fraction by PLAX is 48 %. The left ventricle has mildly decreased function. The left ventricle  demonstrates regional wall motion abnormalities. Strain was performed and the global longitudinal strain is indeterminate. The left ventricular internal cavity size was mildly dilated. There is mild left ventricular hypertrophy. Left ventricular diastolic parameters are indeterminate. Right Ventricle: The right ventricular size is mildly enlarged. No increase in right ventricular wall thickness. Right ventricular systolic function is low normal. There is moderately elevated pulmonary artery systolic pressure. The tricuspid regurgitant  velocity is 3.26 m/s, and with an assumed right atrial pressure of 5 mmHg, the estimated right ventricular systolic pressure is 47.5 mmHg. Left [facility]: Left atrial size was moderately dilated. Right [facility]: Right atrial size was normal in size. Pericardium: There is no evidence of pericardial effusion. Mitral Valve: The mitral valve is normal in structure. Moderate mitral annular calcification. Mild to moderate mitral valve regurgitation. No evidence of mitral valve stenosis. MV peak gradient, 11.0 mmHg. The mean mitral valve gradient is 4.0 mmHg. Tricuspid Valve: The tricuspid valve is normal in structure. Tricuspid valve regurgitation is not demonstrated. No evidence of tricuspid stenosis. Aortic Valve: The aortic valve is normal in structure. Aortic valve regurgitation is not visualized. No aortic stenosis is present. Aortic valve mean gradient measures 12.7 mmHg. Aortic valve peak gradient measures 20.9 mmHg. Aortic valve area, by VTI measures 1.53 cm. Pulmonic Valve: The pulmonic valve was normal in structure. Pulmonic valve regurgitation is not visualized. No evidence of pulmonic stenosis. Aorta: The aortic root is normal in size and structure. Venous: The inferior vena cava is normal in size with greater than 50% respiratory variability, suggesting right atrial pressure of 3 mmHg. IAS/Shunts: No atrial level shunt detected by color flow Doppler. Additional Comments: 3D was  performed not requiring image post processing on an independent workstation and was indeterminate.  LEFT VENTRICLE PLAX 2D LV EF:         Left            Diastology  ventricular     LV e' medial:   5.22 cm/s                ejection        LV E/e' medial: 30.3                fraction by                PLAX is 48                %. LVIDd:         6.10 cm LVIDs:         4.60 cm LV PW:         1.40 cm LV IVS:        1.40 cm LVOT diam:     2.00 cm LV SV:         70 LV SV Index:   30 LVOT Area:     3.14 cm  LV Volumes (MOD) LV vol d, MOD    180.0 ml A2C: LV vol d, MOD    86.1 ml A4C: LV vol s, MOD    161.0 ml A2C: LV vol s, MOD    66.8 ml A4C: LV SV MOD A2C:   19.0 ml LV SV MOD A4C:   86.1 ml LV SV MOD BP:    23.1 ml RIGHT VENTRICLE RV Basal diam:  4.10 cm RV Mid diam:    4.30 cm LEFT ATRIUM             Index        RIGHT ATRIUM           Index LA diam:        5.80 cm 2.52 cm/m   RA Area:     18.00 cm LA Vol (A2C):   84.6 ml 36.70 ml/m  RA Volume:   41.10 ml  17.83 ml/m LA Vol (A4C):   82.9 ml 35.97 ml/m LA Biplane Vol: 88.7 ml 38.48 ml/m  AORTIC VALVE AV Area (Vmax):    1.42 cm AV Area (Vmean):   1.45 cm AV Area (VTI):     1.53 cm AV Vmax:           228.67 cm/s AV Vmean:          164.667 cm/s AV VTI:            0.459 m AV Peak Grad:      20.9 mmHg AV Mean Grad:      12.7 mmHg LVOT Vmax:         103.00 cm/s LVOT Vmean:        76.200 cm/s LVOT VTI:          0.223 m LVOT/AV VTI ratio: 0.49  AORTA Ao Root diam: 3.00 cm MITRAL VALVE                TRICUSPID VALVE MV Area (PHT): 3.21 cm     TR Peak grad:   42.5 mmHg MV Area VTI:   2.20 cm     TR Vmax:        326.00 cm/s MV Peak grad:  11.0 mmHg MV Mean grad:  4.0 mmHg     SHUNTS MV Vmax:       1.66 m/s     Systemic VTI:  0.22 m MV Vmean:      87.6 cm/s    Systemic Diam: 2.00 cm MV Decel Time: 236 msec MV E velocity: 158.00  cm/s Evalene Lunger MD Electronically signed by Evalene Lunger MD Signature Date/Time: 04/08/2024/1:23:21 PM    Final    US   RENAL Result Date: 04/07/2024 EXAM: RETROPERITONEAL ULTRASOUND OF THE KIDNEYS 04/07/2024 07:43:14 PM TECHNIQUE: Real-time ultrasonography of the retroperitoneum, specifically the kidneys and urinary bladder, was performed. COMPARISON: None available. CLINICAL HISTORY: AKI (acute kidney injury) FINDINGS: RIGHT KIDNEY: The native right kidney is atrophic and echogenic in keeping with chronic renal insufficiency, and is largely obscured by overlying bowel gas. This is not well assessed on this examination. There is no hydronephrosis involving the visualized portion of the native right kidney. LEFT KIDNEY: The native left kidney is atrophic and echogenic in keeping with chronic renal insufficiency, and is largely obscured by overlying bowel gas. This is not well assessed on this examination. There is no hydronephrosis involving the visualized portion of the native left kidney. TRANSPLANT KIDNEY: Right lower quadrant transplant kidney is identified. The transplant kidney measures 12.1 x 6.5 x 6.7 cm. Volume: 273.6 cm. No hydronephrosis. No perinephric fluid collection. Normal cortical thickness and echogenicity. No intrarenal masses or calcifications are seen. Normal color flow vascularity. BLADDER: Limited images of the bladder are unremarkable. IMPRESSION: 1. Right lower quadrant transplant kidney without hydronephrosis or perinephric fluid collection. 2. Atrophic and echogenic native kidneys, consistent with chronic renal insufficiency, largely obscured by overlying bowel gas, with no hydronephrosis involving the visualized portions. Electronically signed by: Dorethia Molt MD 04/07/2024 07:49 PM EST RP Workstation: [id number]   DG Chest Portable 1 View Result Date: 04/07/2024 CLINICAL DATA:  Short of breath, cough EXAM: PORTABLE CHEST 1 VIEW COMPARISON:  01/26/2024 FINDINGS: Single frontal view of the chest demonstrates postsurgical changes from CABG. Cardiac silhouette remains enlarged. There is increased  pulmonary vascular congestion with patchy bilateral airspace disease greatest at the left lung base. Trace bilateral pleural effusions. No pneumothorax. IMPRESSION: 1. Congestive heart failure, with worsening volume status since prior study. Electronically Signed   By: Ozell Daring M.D.   On: 04/07/2024 14:56    Assessment and Plan  Jadin Dorn Hartshorne. is a 64 y.o. year old male with medical history of HTN, HLD, T2DM, CHF (EF 45%) and 09/2023, chronic atrial fibrillation, BPH, OSA, ESRD status post renal transplant presenting to the ED with worsening shortness of breath.  Patient has been having URI symptoms for about 10 days.  His symptoms include shortness of breath, coughing but patient denies any fevers or chills. States his breathing has worsened over the last 3 days. He states his medications were adjusted a few days ago.  He reports that he is holding onto fluid in his body and just needs lasix .  Denies any chest pain  per family patient had not been eating and drinking well for past few days.  His baseline creatinine is around 2.8-- 3.0 patient came in with creatinine of more than 5.0 workup in the ER showed clinical dehydration along with significant peripheral edema patient is on the transfer list to [facility] [place]  acute on chronic congestive heart failure diastolic with EF of 40 to 45% significant edema/anasarca -- started on IV Lasix  per Dr. Marcelino nephrology -- [facility] cardiology consultation with Dr. Gollan -- holding losartan  and hydralazine   Acute on chronic kidney disease stage IIIB -- patient is deceased donor kidney transplant June 2012-- follows with Dr. DUFFY Molt [facility]rology at [facility] [place] -- patient is on the transfer list however no beds -- acute kidney injury secondary to dehydration. Patient has had persistent diarrhea  and poor PO intake for few days.- IV fluids at 40 mL an hour -- per nephrology continue outpatient immunosuppressive  medications, prednisone , mycophenolate , tacrolimus - -- creat 4.9--5.18--5.55--5.96 -- further management per nephrology --1/16--pt to be started on HD  A fib appears in sinus rhythm -- on eliquis  -- not on any rate blocking agent due to history of bradycardia  MDD -- takes Wellbutrin  at home--holding  Type II diabetes -- sliding scale insulin  for now -- hold farxiga , gabapentin , mounjaro   Hypertension Holding home losartan  and hydralazine  as patient is getting IV diuresis.  Can restart hydralazine  but will hold losartan  until AKI resolves.   GERD: Continue home PPI   Hyperlipidemia: Continue home statin  Procedures: Family communication : patient's wife Consults : nephrology, cardiology CH MG CODE STATUS: full DVT Prophylaxis : eliquis  Level of care: Progressive Status is: Inpatient Remains inpatient appropriate because: worsening chronic kidney disease--now needing HD  Pt remains on Transfer list to [facility])    TOTAL TIME TAKING CARE OF THIS PATIENT: 45 minutes.  >50% time spent on counselling and coordination of care  Note: This dictation was prepared with Dragon dictation along with smaller phrase technology. Any transcriptional errors that result from this process are unintentional.  Leita Blanch M.D    [facility]   CC: Primary care physician; [name], MD

## 2024-04-09 NOTE — ED Notes (Signed)
 Pt CPAP setup. PT resting comfortably

## 2024-04-09 NOTE — ED Notes (Signed)
 Provider at bedside.

## 2024-04-09 NOTE — Progress Notes (Signed)
 " [facility]  ROUNDING NOTE   Subjective:   [name]. 64 y.o. male with past medical history of ESRD status post renal transplantation in 2012, chronic systolic heart failure ejection fraction 40 to 45%, atrial fibrillation, coronary disease, obesity, aortic stenosis, history of BPH, chronic venous stasis of both lower extremities, diverticulosis, erectile dysfunction, hyperlipidemia, depression, obstructive sleep apnea, pulmonary artery hypertension, status post gastric bypass, diabetes mellitus type 2. He presents with shortness of breath and altered gait for 10 days. Patient is admitted for Acute hypoxic respiratory failure (HCC) [J96.01]  Patient is known to our practice from previous admissions.  He is currently followed by [facility].  Patient is seen sitting up in chair, wife at bedside Patient reports fatigue today Oxygen  recently increased due to shortness of breath.   Objective:  Vital signs in last 24 hours:  Temp:  [97 F (36.1 C)-97.8 F (36.6 C)] 97.8 F (36.6 C) (01/16 1044) Pulse Rate:  [40-98] 71 (01/16 1300) Resp:  [13-27] 13 (01/16 1300) BP: (97-124)/(46-70) 114/46 (01/16 1209) SpO2:  [90 %-100 %] 97 % (01/16 1300) FiO2 (%):  [100 %] 100 % (01/15 1756)  Weight change:  Filed Weights   04/07/24 2119  Weight: 114.6 kg    Intake/Output: I/O last 3 completed shifts: In: 100 [IV Piggyback:100] Out: 80 [Urine:80]   Intake/Output this shift:  No intake/output data recorded.  Physical Exam: General: Ill-appearing  Head: Normocephalic, atraumatic. Moist oral mucosal membranes  Eyes: Anicteric  Lungs:  Coarse  Heart: Regular rate and rhythm  Abdomen:  Soft, nontender  Extremities: 1+ peripheral edema.  Neurologic: Awake, alert, conversant  Skin: Warm,dry, no rash  Access Left AVF    Basic Metabolic Panel: Recent Labs  Lab 04/07/24 1929 04/07/24 2159 04/08/24 0456 04/08/24 1435 04/08/24 2140 04/09/24 1204  NA  --  143  142 142 141 141  K  --  4.5 4.6 4.6 4.7 4.9  CL  --  103 102 103 103 103  CO2  --  25 26 27 24 26   GLUCOSE  --  159* 171* 232* 232* 207*  BUN  --  102* 106* 109* 116* 121*  CREATININE  --  4.95* 5.18* 5.55* 5.96* 6.42*  CALCIUM   --  8.7* 9.0 9.1 8.6* 8.9  MG 2.9*  --   --   --   --   --     Liver Function Tests: Recent Labs  Lab 04/07/24 1408  AST 26  ALT 16  ALKPHOS 203*  BILITOT 0.8  PROT 7.3  ALBUMIN  4.1   No results for input(s): LIPASE, AMYLASE in the last 168 hours. No results for input(s): AMMONIA in the last 168 hours.  CBC: Recent Labs  Lab 04/07/24 1408 04/08/24 0456  WBC 4.5 2.1*  NEUTROABS 3.6  --   HGB 11.3* 11.0*  HCT 38.1* 36.9*  MCV 102.7* 103.1*  PLT 82* 72*    Cardiac Enzymes: No results for input(s): CKTOTAL, CKMB, CKMBINDEX, TROPONINI in the last 168 hours.  BNP: Invalid input(s): POCBNP  CBG: Recent Labs  Lab 04/08/24 1124 04/08/24 1643 04/08/24 2145 04/09/24 0812 04/09/24 1157  GLUCAP 173* 215* 201* 136* 176*    Microbiology: Results for orders placed or performed during the hospital encounter of 04/07/24  Resp panel by RT-PCR (RSV, Flu A&B, Covid) Anterior Nasal Swab     Status: None   Collection Time: 04/07/24  2:08 PM   Specimen: Anterior Nasal Swab  Result Value Ref Range  Status   SARS Coronavirus 2 by RT PCR NEGATIVE NEGATIVE Final    Comment: (NOTE) SARS-CoV-2 target nucleic acids are NOT DETECTED.  The SARS-CoV-2 RNA is generally detectable in upper respiratory specimens during the acute phase of infection. The lowest concentration of SARS-CoV-2 viral copies this assay can detect is 138 copies/mL. A negative result does not preclude SARS-Cov-2 infection and should not be used as the sole basis for treatment or other patient management decisions. A negative result may occur with  improper specimen collection/handling, submission of specimen other than nasopharyngeal swab, presence of viral mutation(s)  within the areas targeted by this assay, and inadequate number of viral copies(<138 copies/mL). A negative result must be combined with clinical observations, patient history, and epidemiological information. The expected result is Negative.  Fact Sheet for Patients:  bloggercourse.com  Fact Sheet for Healthcare Providers:  seriousbroker.it  This test is no t yet approved or cleared by the [place]  FDA and  has been authorized for detection and/or diagnosis of SARS-CoV-2 by FDA under an Emergency Use Authorization (EUA). This EUA will remain  in effect (meaning this test can be used) for the duration of the COVID-19 declaration under Section 564(b)(1) of the Act, 21 U.S.C.section 360bbb-3(b)(1), unless the authorization is terminated  or revoked sooner.       Influenza A by PCR NEGATIVE NEGATIVE Final   Influenza B by PCR NEGATIVE NEGATIVE Final    Comment: (NOTE) The Xpert Xpress SARS-CoV-2/FLU/RSV plus assay is intended as an aid in the diagnosis of influenza from Nasopharyngeal swab specimens and should not be used as a sole basis for treatment. Nasal washings and aspirates are unacceptable for Xpert Xpress SARS-CoV-2/FLU/RSV testing.  Fact Sheet for Patients: bloggercourse.com  Fact Sheet for Healthcare Providers: seriousbroker.it  This test is not yet approved or cleared by the [place]  FDA and has been authorized for detection and/or diagnosis of SARS-CoV-2 by FDA under an Emergency Use Authorization (EUA). This EUA will remain in effect (meaning this test can be used) for the duration of the COVID-19 declaration under Section 564(b)(1) of the Act, 21 U.S.C. section 360bbb-3(b)(1), unless the authorization is terminated or revoked.     Resp Syncytial Virus by PCR NEGATIVE NEGATIVE Final    Comment: (NOTE) Fact Sheet for  Patients: bloggercourse.com  Fact Sheet for Healthcare Providers: seriousbroker.it  This test is not yet approved or cleared by the [place]  FDA and has been authorized for detection and/or diagnosis of SARS-CoV-2 by FDA under an Emergency Use Authorization (EUA). This EUA will remain in effect (meaning this test can be used) for the duration of the COVID-19 declaration under Section 564(b)(1) of the Act, 21 U.S.C. section 360bbb-3(b)(1), unless the authorization is terminated or revoked.  Performed at [facility], [address] [place], [place] [address]   Respiratory (~20 pathogens) panel by PCR     Status: Abnormal   Collection Time: 04/07/24  7:29 PM   Specimen: Nasopharyngeal Swab; Respiratory  Result Value Ref Range Status   Adenovirus NOT DETECTED NOT DETECTED Final   Coronavirus 229E NOT DETECTED NOT DETECTED Final    Comment: (NOTE) The Coronavirus on the Respiratory Panel, DOES NOT test for the novel  Coronavirus (2019 nCoV)    Coronavirus HKU1 NOT DETECTED NOT DETECTED Final   Coronavirus NL63 NOT DETECTED NOT DETECTED Final   Coronavirus OC43 NOT DETECTED NOT DETECTED Final   Metapneumovirus NOT DETECTED NOT DETECTED Final   Rhinovirus / Enterovirus DETECTED (A) NOT DETECTED Final  Influenza A NOT DETECTED NOT DETECTED Final   Influenza B NOT DETECTED NOT DETECTED Final   Parainfluenza Virus 1 NOT DETECTED NOT DETECTED Final   Parainfluenza Virus 2 NOT DETECTED NOT DETECTED Final   Parainfluenza Virus 3 NOT DETECTED NOT DETECTED Final   Parainfluenza Virus 4 NOT DETECTED NOT DETECTED Final   Respiratory Syncytial Virus NOT DETECTED NOT DETECTED Final   Bordetella pertussis NOT DETECTED NOT DETECTED Final   Bordetella Parapertussis NOT DETECTED NOT DETECTED Final   Chlamydophila pneumoniae NOT DETECTED NOT DETECTED Final   Mycoplasma pneumoniae NOT DETECTED NOT DETECTED Final    Comment:  Performed at [facility], [address], [place], [place] [address]  Urine Culture (for pregnant, neutropenic or urologic patients or patients with an indwelling urinary catheter)     Status: None   Collection Time: 04/07/24  8:46 PM   Specimen: Urine, Clean Catch  Result Value Ref Range Status   Specimen Description   Final    URINE, CLEAN CATCH Performed at [facility], [address] [place], [place] [address]    Special Requests   Final    NONE Performed at [facility], [name] [place], [place] [address]    Culture   Final    NO GROWTH Performed at [facility], [address] [place], [place] [address]    Report Status 04/09/2024 FINAL  Final   *Note: Due to a large number of results and/or encounters for the requested time period, some results have not been displayed. A complete set of results can be found in Results Review.    Coagulation Studies: No results for input(s): LABPROT, INR in the last 72 hours.  Urinalysis: Recent Labs    04/07/24 1929  COLORURINE YELLOW*  LABSPEC 1.013  PHURINE 5.0  GLUCOSEU 50*  HGBUR SMALL*  BILIRUBINUR NEGATIVE  KETONESUR NEGATIVE  PROTEINUR 30*  NITRITE NEGATIVE  LEUKOCYTESUR SMALL*      Imaging: ECHOCARDIOGRAM COMPLETE Result Date: 04/08/2024    ECHOCARDIOGRAM REPORT   Patient Name:   [name]. Date of Exam: 04/08/2024 Medical Rec #:  [id number]               Height:       70.0 in Accession #:    [id number]              Weight:       252.6 lb Date of Birth:  [birth year]               BSA:          2.305 m Patient Age:    63 years                BP:           125/65 mmHg Patient Gender: M                       HR:           67 bpm. Exam Location:  [facility] Procedure: 2D Echo, Cardiac Doppler and Color Doppler (Both Spectral and Color            Flow Doppler were utilized during procedure). Indications:     CHF-acute systolic I50.21  History:         Patient has prior history  of Echocardiogram examinations, most  recent 09/27/2023. CAD, Arrythmias:Atrial Fibrillation; Risk                  Factors:Hypertension. Aortic stenosis.  Sonographer:     Christopher Furnace Referring Phys:  [id number] [facility] Diagnosing Phys: Evalene Lunger MD IMPRESSIONS  1. Left ventricular ejection fraction, by estimation, is 40 to 45%. Left ventricular ejection fraction by PLAX is 48 %. The left ventricle has mildly decreased function. The left ventricle demonstrates regional wall motion abnormalities (septal motion consistent with bundle branch block).  2. The left ventricular internal cavity size was mildly dilated. There is mild left ventricular hypertrophy. Left ventricular diastolic parameters are indeterminate.  3. Right ventricular systolic function is low normal. The right ventricular size is mildly enlarged. There is moderately elevated pulmonary artery systolic pressure. The estimated right ventricular systolic pressure is 47.5 mmHg.  4. Left atrial size was moderately dilated.  5. The mitral valve is normal in structure. Mild to moderate mitral valve regurgitation. No evidence of mitral stenosis. The mean mitral valve gradient is 4.0 mmHg. Moderate mitral annular calcification.  6. The aortic valve is normal in structure. Aortic valve regurgitation is not visualized. No aortic stenosis is present.  7. The inferior vena cava is normal in size with greater than 50% respiratory variability, suggesting right atrial pressure of 3 mmHg. FINDINGS  Left Ventricle: Left ventricular ejection fraction, by estimation, is 40 to 45%. Left ventricular ejection fraction by PLAX is 48 %. The left ventricle has mildly decreased function. The left ventricle demonstrates regional wall motion abnormalities. Strain was performed and the global longitudinal strain is indeterminate. The left ventricular internal cavity size was mildly dilated. There is mild left ventricular hypertrophy. Left ventricular diastolic  parameters are indeterminate. Right Ventricle: The right ventricular size is mildly enlarged. No increase in right ventricular wall thickness. Right ventricular systolic function is low normal. There is moderately elevated pulmonary artery systolic pressure. The tricuspid regurgitant  velocity is 3.26 m/s, and with an assumed right atrial pressure of 5 mmHg, the estimated right ventricular systolic pressure is 47.5 mmHg. Left [facility]: Left atrial size was moderately dilated. Right [facility]: Right atrial size was normal in size. Pericardium: There is no evidence of pericardial effusion. Mitral Valve: The mitral valve is normal in structure. Moderate mitral annular calcification. Mild to moderate mitral valve regurgitation. No evidence of mitral valve stenosis. MV peak gradient, 11.0 mmHg. The mean mitral valve gradient is 4.0 mmHg. Tricuspid Valve: The tricuspid valve is normal in structure. Tricuspid valve regurgitation is not demonstrated. No evidence of tricuspid stenosis. Aortic Valve: The aortic valve is normal in structure. Aortic valve regurgitation is not visualized. No aortic stenosis is present. Aortic valve mean gradient measures 12.7 mmHg. Aortic valve peak gradient measures 20.9 mmHg. Aortic valve area, by VTI measures 1.53 cm. Pulmonic Valve: The pulmonic valve was normal in structure. Pulmonic valve regurgitation is not visualized. No evidence of pulmonic stenosis. Aorta: The aortic root is normal in size and structure. Venous: The inferior vena cava is normal in size with greater than 50% respiratory variability, suggesting right atrial pressure of 3 mmHg. IAS/Shunts: No atrial level shunt detected by color flow Doppler. Additional Comments: 3D was performed not requiring image post processing on an independent workstation and was indeterminate.  LEFT VENTRICLE PLAX 2D LV EF:         Left            Diastology  ventricular     LV e' medial:   5.22 cm/s                ejection        LV  E/e' medial: 30.3                fraction by                PLAX is 48                %. LVIDd:         6.10 cm LVIDs:         4.60 cm LV PW:         1.40 cm LV IVS:        1.40 cm LVOT diam:     2.00 cm LV SV:         70 LV SV Index:   30 LVOT Area:     3.14 cm  LV Volumes (MOD) LV vol d, MOD    180.0 ml A2C: LV vol d, MOD    86.1 ml A4C: LV vol s, MOD    161.0 ml A2C: LV vol s, MOD    66.8 ml A4C: LV SV MOD A2C:   19.0 ml LV SV MOD A4C:   86.1 ml LV SV MOD BP:    23.1 ml RIGHT VENTRICLE RV Basal diam:  4.10 cm RV Mid diam:    4.30 cm LEFT ATRIUM             Index        RIGHT ATRIUM           Index LA diam:        5.80 cm 2.52 cm/m   RA Area:     18.00 cm LA Vol (A2C):   84.6 ml 36.70 ml/m  RA Volume:   41.10 ml  17.83 ml/m LA Vol (A4C):   82.9 ml 35.97 ml/m LA Biplane Vol: 88.7 ml 38.48 ml/m  AORTIC VALVE AV Area (Vmax):    1.42 cm AV Area (Vmean):   1.45 cm AV Area (VTI):     1.53 cm AV Vmax:           228.67 cm/s AV Vmean:          164.667 cm/s AV VTI:            0.459 m AV Peak Grad:      20.9 mmHg AV Mean Grad:      12.7 mmHg LVOT Vmax:         103.00 cm/s LVOT Vmean:        76.200 cm/s LVOT VTI:          0.223 m LVOT/AV VTI ratio: 0.49  AORTA Ao Root diam: 3.00 cm MITRAL VALVE                TRICUSPID VALVE MV Area (PHT): 3.21 cm     TR Peak grad:   42.5 mmHg MV Area VTI:   2.20 cm     TR Vmax:        326.00 cm/s MV Peak grad:  11.0 mmHg MV Mean grad:  4.0 mmHg     SHUNTS MV Vmax:       1.66 m/s     Systemic VTI:  0.22 m MV Vmean:      87.6 cm/s    Systemic Diam: 2.00 cm MV Decel Time: 236 msec MV E velocity:  158.00 cm/s Evalene Lunger MD Electronically signed by Evalene Lunger MD Signature Date/Time: 04/08/2024/1:23:21 PM    Final    US  RENAL Result Date: 04/07/2024 EXAM: RETROPERITONEAL ULTRASOUND OF THE KIDNEYS 04/07/2024 07:43:14 PM TECHNIQUE: Real-time ultrasonography of the retroperitoneum, specifically the kidneys and urinary bladder, was performed. COMPARISON: None available. CLINICAL  HISTORY: AKI (acute kidney injury) FINDINGS: RIGHT KIDNEY: The native right kidney is atrophic and echogenic in keeping with chronic renal insufficiency, and is largely obscured by overlying bowel gas. This is not well assessed on this examination. There is no hydronephrosis involving the visualized portion of the native right kidney. LEFT KIDNEY: The native left kidney is atrophic and echogenic in keeping with chronic renal insufficiency, and is largely obscured by overlying bowel gas. This is not well assessed on this examination. There is no hydronephrosis involving the visualized portion of the native left kidney. TRANSPLANT KIDNEY: Right lower quadrant transplant kidney is identified. The transplant kidney measures 12.1 x 6.5 x 6.7 cm. Volume: 273.6 cm. No hydronephrosis. No perinephric fluid collection. Normal cortical thickness and echogenicity. No intrarenal masses or calcifications are seen. Normal color flow vascularity. BLADDER: Limited images of the bladder are unremarkable. IMPRESSION: 1. Right lower quadrant transplant kidney without hydronephrosis or perinephric fluid collection. 2. Atrophic and echogenic native kidneys, consistent with chronic renal insufficiency, largely obscured by overlying bowel gas, with no hydronephrosis involving the visualized portions. Electronically signed by: Dorethia Molt MD 04/07/2024 07:49 PM EST RP Workstation: [id number]   DG Chest Portable 1 View Result Date: 04/07/2024 CLINICAL DATA:  Short of breath, cough EXAM: PORTABLE CHEST 1 VIEW COMPARISON:  01/26/2024 FINDINGS: Single frontal view of the chest demonstrates postsurgical changes from CABG. Cardiac silhouette remains enlarged. There is increased pulmonary vascular congestion with patchy bilateral airspace disease greatest at the left lung base. Trace bilateral pleural effusions. No pneumothorax. IMPRESSION: 1. Congestive heart failure, with worsening volume status since prior study. Electronically Signed    By: Ozell Daring M.D.   On: 04/07/2024 14:56     Medications:      apixaban   5 mg Oral BID   Chlorhexidine  Gluconate Cloth  6 each Topical Q0600   furosemide   80 mg Intravenous BID   insulin  aspart  0-5 Units Subcutaneous QHS   insulin  aspart  0-9 Units Subcutaneous TID WC   ipratropium-albuterol        mycophenolate   360 mg Oral q morning   And   mycophenolate   180 mg Oral QHS   predniSONE   5 mg Oral Daily   sodium chloride  flush  3 mL Intravenous Q12H   tacrolimus   1 mg Oral QHS   tacrolimus   2 mg Oral q morning   tamsulosin   0.4 mg Oral Daily   acetaminophen  **OR** acetaminophen , ipratropium-albuterol , ondansetron  **OR** ondansetron  (ZOFRAN ) IV, senna-docusate  Assessment/ Plan:  [name] is a 64 y.o.  male with past medical history of ESRD status post renal transplantation in 2012, chronic systolic heart failure ejection fraction 40 to 45%, atrial fibrillation, coronary disease, obesity, aortic stenosis, history of BPH, chronic venous stasis of both lower extremities, diverticulosis, erectile dysfunction, hyperlipidemia, depression, obstructive sleep apnea, pulmonary artery hypertension, status post gastric bypass, diabetes mellitus type 2.  Acute Kidney Injury on chronic kidney disease stage 3 BT with baseline creatinine 2.72 on 2024-03-05. Deceased donor kidney transplant 08/2010  Acute kidney injury secondary to dehydration at this time.  Patient reports persistent diarrhea with poor oral intake spanning a few days.   Attempted fluid  resuscitation to regain renal function however unsuccessful.  Discussed with patient and wife the desire to reinitiate dialysis.  They are agreeable.  Patient maintains a functioning fistula in left arm.  Hepatitis B panel ordered.  Will initiate dialysis today and continue tomorrow.  Will assess renal function early next week to determine need for outpatient placement.  Continue immunosuppressive's as ordered.   Lab Results   Component Value Date   CREATININE 6.42 (H) 04/09/2024   CREATININE 5.96 (H) 04/08/2024   CREATININE 5.55 (H) 04/08/2024    Intake/Output Summary (Last 24 hours) at 04/09/2024 1434 Last data filed at 04/08/2024 2210 Gross per 24 hour  Intake 100 ml  Output --  Net 100 ml   2. Anemia of chronic kidney disease Lab Results  Component Value Date   HGB 11.0 (L) 04/08/2024    Hemoglobin within optimal range  3.  Acute respiratory failure, patient with history of CHF.  Concern for volume overload.  Primary team has ordered IV furosemide .  Patient also positive for rhinovirus.  Continue supportive measures.  4. Diabetes mellitus type II with chronic kidney disease/renal manifestations: insulin  dependent. Home regimen includes  Farxiga  and Mounjaro . Most recent hemoglobin A1c is 6.2 on 03/12/24.     LOS: 1 [name] 1/16/20262:34 PM   "

## 2024-04-09 NOTE — Evaluation (Signed)
 Physical Therapy Evaluation Patient Details Name: [name]. MRN: [id number] DOB: [birth year] Today's Date: 04/09/2024  History of Present Illness  Pt is a 64 y/o M admitted on 04/07/24 after presenting with c/o worsening SOB. Pt is being treated for acute on chronic CHF, acute on CKD. Pt tested positive for rhinovirus. PMH: HTN, HLD, DM2, CHF, chronic a-fib, BPH, OSA, ESRD s/p renal transplant, anxiety, gout  Clinical Impression  Pt seen for PT tx with pt agreeable, wife present for session. Pt shivering throughout session, provided with warm blankets at end of session. Pt reports prior to admission he was independent without AD but reports 3 falls in the past 6 months. On this date, pt requires +2 for sit>stand from recliner, tolerates standing & marching in place, upright ~1 minute with RW & min assist before requiring rest. Further mobility limited by pt hooked to stationary IV pole. Recommend ongoing PT services to progress mobility as able. Pt would benefit from ongoing PT services to progress mobility as able. Pt has good support system & eager to get better, recommend post acute rehab >3 hours therapy/day upon d/c.      If plan is discharge home, recommend the following: Two people to help with walking and/or transfers;A lot of help with bathing/dressing/bathroom;Assistance with cooking/housework   Can travel by private vehicle        Equipment Recommendations Other (comment) (TBD)  Recommendations for Other Services       Functional Status Assessment Patient has had a recent decline in their functional status and demonstrates the ability to make significant improvements in function in a reasonable and predictable amount of time.     Precautions / Restrictions Precautions Precautions: Fall Restrictions Weight Bearing Restrictions Per Provider Order: No      Mobility  Bed Mobility               General bed mobility comments: not tested, pt received & left  sitting in recliner    Transfers Overall transfer level: Needs assistance Equipment used: Rolling walker (2 wheels) Transfers: Sit to/from Stand Sit to Stand: Mod assist, +2 physical assistance           General transfer comment: wife provided assistance for sit>stand from recliner    Ambulation/Gait                  Stairs            Wheelchair Mobility     Tilt Bed    Modified Rankin (Stroke Patients Only)       Balance Overall balance assessment: Needs assistance Sitting-balance support: Feet supported Sitting balance-Leahy Scale: Good     Standing balance support: During functional activity, Bilateral upper extremity supported, Reliant on assistive device for balance Standing balance-Leahy Scale: Poor                               Pertinent Vitals/Pain Pain Assessment Pain Assessment: No/denies pain    Home Living Family/patient expects to be discharged to:: Private residence Living Arrangements: Spouse/significant other Available Help at Discharge: Family;Available PRN/intermittently Type of Home: House Home Access: Stairs to enter Entrance Stairs-Rails: None Entrance Stairs-Number of Steps: 2   Home Layout: One level Home Equipment: Shower seat;Grab bars - tub/shower;Rolling Walker (2 wheels)      Prior Function Prior Level of Function : Needs assist             Mobility Comments:  Pt reports he was ambulatory without, had 3 falls in the past 6 months.       Extremity/Trunk Assessment   Upper Extremity Assessment Upper Extremity Assessment: Generalized weakness;Defer to OT evaluation    Lower Extremity Assessment Lower Extremity Assessment: Generalized weakness (discoloration in distal BLE)       Communication   Communication Communication: No apparent difficulties    Cognition Arousal: Alert Behavior During Therapy: WFL for tasks assessed/performed   PT - Cognitive impairments: No apparent  impairments                         Following commands: Intact       Cueing Cueing Techniques: Verbal cues     General Comments General comments (skin integrity, edema, etc.): Pt on 10L/min via nasal cannula, SPO2 as low as 86% after standing but recovered with rest. Pt shivering, c/o feeling cold, provided pt with warm blankets.    Exercises     Assessment/Plan    PT Assessment Patient needs continued PT services  PT Problem List Decreased strength;Decreased coordination;Cardiopulmonary status limiting activity;Decreased range of motion;Decreased activity tolerance;Decreased balance;Decreased mobility;Decreased knowledge of precautions;Decreased knowledge of use of DME       PT Treatment Interventions Balance training;DME instruction;Therapeutic exercise;Neuromuscular re-education;Gait training;Functional mobility training;Patient/family education;Stair training;Therapeutic activities    PT Goals (Current goals can be found in the Care Plan section)  Acute Rehab PT Goals Patient Stated Goal: get better PT Goal Formulation: With patient/family Time For Goal Achievement: 04/23/24 Potential to Achieve Goals: Good    Frequency Min 3X/week     Co-evaluation               AM-PAC PT 6 Clicks Mobility  Outcome Measure Help needed turning from your back to your side while in a flat bed without using bedrails?: A Little Help needed moving from lying on your back to sitting on the side of a flat bed without using bedrails?: A Lot Help needed moving to and from a bed to a chair (including a wheelchair)?: A Lot Help needed standing up from a chair using your arms (e.g., wheelchair or bedside chair)?: Total Help needed to walk in hospital room?: Total Help needed climbing 3-5 steps with a railing? : Total 6 Click Score: 10    End of Session Equipment Utilized During Treatment: Oxygen  Activity Tolerance: Patient tolerated treatment well;Patient limited by  fatigue Patient left: in chair;with family/visitor present Nurse Communication: Mobility status PT Visit Diagnosis: Unsteadiness on feet (R26.81);Difficulty in walking, not elsewhere classified (R26.2);Muscle weakness (generalized) (M62.81);Other abnormalities of gait and mobility (R26.89)    Time: 1129-1140 PT Time Calculation (min) (ACUTE ONLY): 11 min   Charges:   PT Evaluation $PT Eval Moderate Complexity: 1 Mod   PT General Charges $$ ACUTE PT VISIT: 1 Visit         Richerd Pinal, PT, DPT 04/09/24, 1:17 PM   [name] 04/09/2024, 1:15 PM

## 2024-04-09 NOTE — Progress Notes (Signed)
 "  Cardiology Progress Note   Patient Name: [name]. Date of Encounter: 04/09/2024  Primary Cardiologist: Deatrice Cage, MD  Subjective   Notes drop in UO since yesterday.  Small amt of brown urine in container at this time.  Became very short of breath earlier this morning which seemed to improve following a DuoNeb.  Denies chest pain or palpitations. Objective   Inpatient Medications    Scheduled Meds:  apixaban   5 mg Oral BID   furosemide   80 mg Intravenous BID   insulin  aspart  0-5 Units Subcutaneous QHS   insulin  aspart  0-9 Units Subcutaneous TID WC   ipratropium-albuterol        mycophenolate   360 mg Oral q morning   And   mycophenolate   180 mg Oral QHS   predniSONE   5 mg Oral Daily   sodium chloride  flush  3 mL Intravenous Q12H   tacrolimus   1 mg Oral QHS   tacrolimus   2 mg Oral q morning   tamsulosin   0.4 mg Oral Daily   Continuous Infusions:  sodium chloride  40 mL/hr at 04/09/24 0642   cefTRIAXone  (ROCEPHIN )  IV Stopped (04/08/24 2210)   PRN Meds: acetaminophen  **OR** acetaminophen , ipratropium-albuterol , ondansetron  **OR** ondansetron  (ZOFRAN ) IV, senna-docusate   Vital Signs    Vitals:   04/09/24 0651 04/09/24 0652 04/09/24 0653 04/09/24 0706  BP:      Pulse: 66 90 83   Resp: (!) 25 (!) 22 (!) 23   Temp:      TempSrc:      SpO2: 94% 92% 94% 99%  Weight:        Intake/Output Summary (Last 24 hours) at 04/09/2024 0835 Last data filed at 04/08/2024 2210 Gross per 24 hour  Intake 100 ml  Output --  Net 100 ml   Filed Weights   04/07/24 2119  Weight: 114.6 kg    Physical Exam   GEN: Well nourished, well developed, in no acute distress.  HEENT: Grossly normal.  Neck: Supple, unable to assess JVD PE secondary to girth. Cardiac: IR, IR, 2/6 systolic murmur at the left lower sternal border/apex, no rubs or gallops. No clubbing, cyanosis, trace bilateral lower extremity woody edema.  Radials 2+, DP/PT 1+ and equal bilaterally.   Respiratory:  Respirations regular and unlabored, bibasilar crackles. GI: Semifirm and protuberant, nontender, BS + x 4. MS: no deformity or atrophy. Skin: warm and dry, no rash. Neuro:  Strength and sensation are intact. Psych: AAOx3.  Normal affect.  Labs    Chemistry Recent Labs  Lab 04/07/24 1408 04/07/24 2159 04/08/24 0456 04/08/24 1435 04/08/24 2140  NA 144   < > 142 142 141  K 4.3   < > 4.6 4.6 4.7  CL 104   < > 102 103 103  CO2 25   < > 26 27 24   GLUCOSE 121*   < > 171* 232* 232*  BUN 100*   < > 106* 109* 116*  CREATININE 4.75*   < > 5.18* 5.55* 5.96*  CALCIUM  8.9   < > 9.0 9.1 8.6*  PROT 7.3  --   --   --   --   ALBUMIN  4.1  --   --   --   --   AST 26  --   --   --   --   ALT 16  --   --   --   --   ALKPHOS 203*  --   --   --   --  BILITOT 0.8  --   --   --   --   GFRNONAA 13*   < > 12* 11* 10*  ANIONGAP 14   < > 14 12 14    < > = values in this interval not displayed.     Hematology Recent Labs  Lab 04/07/24 1408 04/08/24 0456  WBC 4.5 2.1*  RBC 3.71* 3.58*  HGB 11.3* 11.0*  HCT 38.1* 36.9*  MCV 102.7* 103.1*  MCH 30.5 30.7  MCHC 29.7* 29.8*  RDW 16.9* 16.8*  PLT 82* 72*    Cardiac Enzymes  Recent Labs  Lab 04/07/24 1408 04/07/24 1929  TRNPT 129* 118*  ]  BNP    Component Value Date/Time   BNP 585.6 (H) 02/17/2024 1145   BNP 544.9 (H) 01/26/2024 1024    ProBNP    Component Value Date/Time   PROBNP >35,000.0 (H) 04/07/2024 1408   PROBNP 4,402 (H) 10/21/2022 1214   Lipids  Lab Results  Component Value Date   CHOL 72 (L) 08/21/2023   HDL 37 (L) 08/21/2023   LDLCALC 18 08/21/2023   LDLDIRECT 41.9 04/20/2012   TRIG 83 08/21/2023   CHOLHDL 1.9 08/21/2023    HbA1c  Lab Results  Component Value Date   HGBA1C 5.5 11/06/2023    Radiology    US  RENAL Result Date: 04/07/2024 EXAM: RETROPERITONEAL ULTRASOUND OF THE KIDNEYS 04/07/2024 07:43:14 PM TECHNIQUE: Real-time ultrasonography of the retroperitoneum, specifically the  kidneys and urinary bladder, was performed. COMPARISON: None available. CLINICAL HISTORY: AKI (acute kidney injury) FINDINGS: RIGHT KIDNEY: The native right kidney is atrophic and echogenic in keeping with chronic renal insufficiency, and is largely obscured by overlying bowel gas. This is not well assessed on this examination. There is no hydronephrosis involving the visualized portion of the native right kidney. LEFT KIDNEY: The native left kidney is atrophic and echogenic in keeping with chronic renal insufficiency, and is largely obscured by overlying bowel gas. This is not well assessed on this examination. There is no hydronephrosis involving the visualized portion of the native left kidney. TRANSPLANT KIDNEY: Right lower quadrant transplant kidney is identified. The transplant kidney measures 12.1 x 6.5 x 6.7 cm. Volume: 273.6 cm. No hydronephrosis. No perinephric fluid collection. Normal cortical thickness and echogenicity. No intrarenal masses or calcifications are seen. Normal color flow vascularity. BLADDER: Limited images of the bladder are unremarkable. IMPRESSION: 1. Right lower quadrant transplant kidney without hydronephrosis or perinephric fluid collection. 2. Atrophic and echogenic native kidneys, consistent with chronic renal insufficiency, largely obscured by overlying bowel gas, with no hydronephrosis involving the visualized portions. Electronically signed by: Dorethia Molt MD 04/07/2024 07:49 PM EST RP Workstation: [id number]   DG Chest Portable 1 View Result Date: 04/07/2024 CLINICAL DATA:  Short of breath, cough EXAM: PORTABLE CHEST 1 VIEW COMPARISON:  01/26/2024 FINDINGS: Single frontal view of the chest demonstrates postsurgical changes from CABG. Cardiac silhouette remains enlarged. There is increased pulmonary vascular congestion with patchy bilateral airspace disease greatest at the left lung base. Trace bilateral pleural effusions. No pneumothorax. IMPRESSION: 1. Congestive heart  failure, with worsening volume status since prior study. Electronically Signed   By: Ozell Daring M.D.   On: 04/07/2024 14:56     Telemetry    Afib, 60's to 80's - Personally Reviewed  Cardiac Studies   2D Echocardiogram 1.15.2026   1. Left ventricular ejection fraction, by estimation, is 40 to 45%. Left  ventricular ejection fraction by PLAX is 48 %. The left ventricle has  mildly decreased  function. The left ventricle demonstrates regional wall  motion abnormalities (septal motion  consistent with bundle branch block).   2. The left ventricular internal cavity size was mildly dilated. There is  mild left ventricular hypertrophy. Left ventricular diastolic parameters  are indeterminate.   3. Right ventricular systolic function is low normal. The right  ventricular size is mildly enlarged. There is moderately elevated  pulmonary artery systolic pressure. The estimated right ventricular  systolic pressure is 47.5 mmHg.   4. Left atrial size was moderately dilated.   5. The mitral valve is normal in structure. Mild to moderate mitral valve  regurgitation. No evidence of mitral stenosis. The mean mitral valve  gradient is 4.0 mmHg. Moderate mitral annular calcification.   6. The aortic valve is normal in structure. Aortic valve regurgitation is  not visualized. No aortic stenosis is present.   7. The inferior vena cava is normal in size with greater than 50%  respiratory variability, suggesting right atrial pressure of 3 mmHg.  _____________   Patient Profile     63 y.o. male with a history of CAD status post CABG in 2005, permanent atrial fibrillation on Eliquis , end-stage renal disease s/p renal transplant 2012, ischemic cardiomyopathy, chronic heart failure with midrange ejection fraction, pulmonary hypertension, hypertension, hyperlipidemia, obesity status post gastric bypass, BPH, gout, and anemia, who was admitted January 14 with respiratory failure, rhinovirus, acute  CHF/volume overload, and acute on chronic renal failure.  Assessment & Plan    1.  Acute on chronic heart failure with midrange ejection fraction/ischemic cardiomyopathy: History of midrange ejection fraction with echo on January 15 again showing an EF of 40 to 45% with mild LVH, RVSP 47.5, and mild to moderate MR.  Patient presented with 7 to 10-day history of upper respiratory symptoms followed by reduction in urine output and dyspnea.  proBNP greater than 35,000 with chest x-ray showing CHF on admission.  He was initially placed on intravenous Lasix  with good response though over the past 24 hours, urine output has dropped significantly.  On examination, he continues to feel that his abdomen is full and he has bibasilar crackles.  Body habitus prevents adequate evaluation of neck veins.  BUN and creatinine were higher last night and labs are pending this morning.  Defer diuretic management to nephrology team though renal replacement therapy being considered.  Home doses of GDMT including lisinopril  and Farxiga  are on hold in the setting of acute renal failure.  2.  End-stage renal disease s/p renal transplant/acute kidney injury: See above.  Nephrology following.  Labs pending this morning.  May require renal replacement therapy today.  3.  Coronary artery disease: Status post prior CABG with nonischemic Myoview  in December 2022.  He has not been experiencing chest pain.  In setting of volume overload and AKI, troponin is elevated at 129 with a follow-up value of 118.  Suspect demand ischemia in the setting of heart failure and AKI.  Follow-up limited echo showed stable, midrange LV dysfunction with EF of 40 to 45%.  He is not on aspirin  in setting of Eliquis .  No beta-blocker in the setting history of bradycardia.  Home dose of statin has been held since admission.  Recommend resumption when okay with primary team.  4.  Perm atrial fibrillation: Rate control in the absence of AV nodal blocking agents  due to history of bradycardia.  He is chronically anticoagulated Eliquis .  5.  Primary hypertension: Blood pressure is normal.  Home dose lisinopril  on hold.  6.  Hyperlipidemia: LDL at goal in May 2025.  Outpatient rosuvastatin  dose currently on holdH&H .  7.  Macrocytic anemia: Stable on admission.  8.  UTI: Ceftriaxone  per primary team.  9.  Rhinovirus: Supportive care per primary team.  Signed, Lonni Meager, NP  04/09/2024, 8:35 AM    For questions or updates, please contact   Please consult [email or web address] for contact info under Cardiology/STEMI.  "

## 2024-04-09 NOTE — Progress Notes (Signed)
" °  Inpatient Rehab Admissions Coordinator :  Per therapy recommendations, patient was screened for CIR candidacy by Heron Leavell RN MSN. Noted to begin hemodialysis /history of.renal tranplant. On transfer list for [facility]. Await further progress with therapy once dialysis initiated, to assist with planning rehab venue needs.   Heron Leavell RN MSN Admissions Coordinator [phone]   "

## 2024-04-09 NOTE — ED Notes (Signed)
 Report given to dialysis RN

## 2024-04-09 NOTE — ED Notes (Signed)
 Xeroform and rolled gauze applied to wound LRE.

## 2024-04-09 NOTE — Progress Notes (Signed)
 Patient cannulated x 2 without difficulty. BFR was adequate at 250 ml/min. Patient provided education regarding the importance of keeping his left arm still during his treatment. Patient received 45 minutes of treatment before reaching for his cell phone. Staff attempted to provide assistance. However the patient has already made contact with the device and caused the venous needle to infiltrate. Patient experienced a burning sensation with egg sized swelling. BFR stopped immediately. Needles removed. Makeshift ice pack applied. Access is positive for thrill and bruit. Swelling has resolved somewhat with listed interventions.

## 2024-04-09 NOTE — ED Notes (Signed)
 This RN paged hospitalist x 2 no call back. RT  notified of pt increased work of breathing and c/o shortness of breath. This RN was at bedside, pt began c/o feeling short of breath and asking can I sit up some. Pt HOB elevate, pt still c/o shortness of breath and displayed increased work of breathing and became flushed in the face, HF increased, hospitalist and RT notified. One duoneb treatment overrode in pyxis.

## 2024-04-10 ENCOUNTER — Encounter: Admission: EM | Disposition: E | Payer: Self-pay | Source: Home / Self Care | Attending: Internal Medicine

## 2024-04-10 ENCOUNTER — Inpatient Hospital Stay

## 2024-04-10 ENCOUNTER — Inpatient Hospital Stay: Admitting: Certified Registered"

## 2024-04-10 DIAGNOSIS — I509 Heart failure, unspecified: Secondary | ICD-10-CM | POA: Diagnosis not present

## 2024-04-10 DIAGNOSIS — I5022 Chronic systolic (congestive) heart failure: Secondary | ICD-10-CM | POA: Diagnosis not present

## 2024-04-10 DIAGNOSIS — I482 Chronic atrial fibrillation, unspecified: Secondary | ICD-10-CM | POA: Diagnosis not present

## 2024-04-10 DIAGNOSIS — N186 End stage renal disease: Secondary | ICD-10-CM

## 2024-04-10 DIAGNOSIS — Z992 Dependence on renal dialysis: Secondary | ICD-10-CM

## 2024-04-10 HISTORY — PX: INSERTION OF DIALYSIS CATHETER: SHX1324

## 2024-04-10 LAB — GLUCOSE, CAPILLARY
Glucose-Capillary: 123 mg/dL — ABNORMAL HIGH (ref 70–99)
Glucose-Capillary: 129 mg/dL — ABNORMAL HIGH (ref 70–99)
Glucose-Capillary: 146 mg/dL — ABNORMAL HIGH (ref 70–99)
Glucose-Capillary: 140 mg/dL — ABNORMAL HIGH (ref 70–99)

## 2024-04-10 LAB — TACROLIMUS LEVEL: Tacrolimus (FK506) - LabCorp: 10.7 ng/mL (ref 5.0–20.0)

## 2024-04-10 LAB — HEPATITIS B SURFACE ANTIBODY, QUANTITATIVE: Hep B S AB Quant (Post): <3.5 m[IU]/mL — ABNORMAL LOW

## 2024-04-10 MED ORDER — IPRATROPIUM-ALBUTEROL 0.5-2.5 (3) MG/3ML IN SOLN
3.0000 mL | RESPIRATORY_TRACT | Status: DC
  Administered 2024-04-10: 3 mL via RESPIRATORY_TRACT

## 2024-04-10 MED ORDER — LIDOCAINE-EPINEPHRINE 1 %-1:100000 IJ SOLN
INTRAMUSCULAR | Status: AC
  Filled 2024-04-10: qty 20

## 2024-04-10 MED ORDER — SODIUM CHLORIDE (PF) 0.9 % IJ SOLN
INTRAMUSCULAR | Status: AC
  Filled 2024-04-10: qty 50

## 2024-04-10 MED ORDER — CEFAZOLIN SODIUM-DEXTROSE 2-3 GM-%(50ML) IV SOLR
INTRAVENOUS | Status: DC | PRN
  Administered 2024-04-10: 2 g via INTRAVENOUS

## 2024-04-10 MED ORDER — 0.9 % SODIUM CHLORIDE (POUR BTL) OPTIME
TOPICAL | Status: DC | PRN
  Administered 2024-04-10: 500 mL

## 2024-04-10 MED ORDER — HEPARIN SODIUM (PORCINE) 10000 UNIT/ML IJ SOLN
INTRAMUSCULAR | Status: DC | PRN
  Administered 2024-04-10: 4600 [IU] via INTRAVENOUS

## 2024-04-10 MED ORDER — LIDOCAINE-EPINEPHRINE 1 %-1:100000 IJ SOLN
INTRAMUSCULAR | Status: DC | PRN
  Administered 2024-04-10: 15 mL

## 2024-04-10 MED ORDER — DEXMEDETOMIDINE HCL IN NACL 80 MCG/20ML IV SOLN
INTRAVENOUS | Status: DC | PRN
  Administered 2024-04-10: 10 ug via INTRAVENOUS

## 2024-04-10 MED ORDER — HEPARIN SODIUM (PORCINE) 1000 UNIT/ML IJ SOLN
INTRAMUSCULAR | Status: AC
  Filled 2024-04-10: qty 10

## 2024-04-10 MED ORDER — PROPOFOL 10 MG/ML IV BOLUS
INTRAVENOUS | Status: AC
  Filled 2024-04-10: qty 20

## 2024-04-10 MED ORDER — FENTANYL CITRATE (PF) 100 MCG/2ML IJ SOLN
INTRAMUSCULAR | Status: AC
  Filled 2024-04-10: qty 2

## 2024-04-10 MED ORDER — MIDAZOLAM HCL 5 MG/5ML IJ SOLN
INTRAMUSCULAR | Status: DC | PRN
  Administered 2024-04-10 (×2): 1 mg via INTRAVENOUS

## 2024-04-10 MED ORDER — HEPARIN SODIUM (PORCINE) 10000 UNIT/ML IJ SOLN
INTRAMUSCULAR | Status: AC
  Filled 2024-04-10: qty 1

## 2024-04-10 MED ORDER — IPRATROPIUM-ALBUTEROL 0.5-2.5 (3) MG/3ML IN SOLN
RESPIRATORY_TRACT | Status: AC
  Filled 2024-04-10: qty 3

## 2024-04-10 MED ORDER — MIDAZOLAM HCL 2 MG/2ML IJ SOLN
INTRAMUSCULAR | Status: AC
  Filled 2024-04-10: qty 2

## 2024-04-10 MED ORDER — SODIUM CHLORIDE 0.9 % IV SOLN: INTRAVENOUS | Status: DC | PRN

## 2024-04-10 MED ORDER — ROSUVASTATIN CALCIUM 10 MG PO TABS
10.0000 mg | ORAL_TABLET | Freq: Every day | ORAL | Status: DC
  Administered 2024-04-10 – 2024-04-11 (×2): 10 mg via ORAL
  Filled 2024-04-10 (×2): qty 1

## 2024-04-10 MED ORDER — HEPARIN SODIUM (PORCINE) 5000 UNIT/ML IJ SOLN
INTRAMUSCULAR | Status: AC
  Filled 2024-04-10: qty 1

## 2024-04-10 NOTE — Progress Notes (Signed)
 " [facility]  PROGRESS NOTE   Subjective:   Patient had attempted dialysis this morning.  AV access did not work.  Vascular consulted.  Patient now has a permacath placed.  Objective:  Vital signs: Blood pressure (!) 93/47, pulse 79, temperature 97.7 F (36.5 C), resp. rate 18, height 5' 10 (1.778 m), weight 112.8 kg, SpO2 97%.  Intake/Output Summary (Last 24 hours) at 04/10/2024 1501 Last data filed at 04/10/2024 1425 Gross per 24 hour  Intake 100 ml  Output 0 ml  Net 100 ml   Filed Weights   04/09/24 2049 04/10/24 0500 04/10/24 0828  Weight: 114 kg 117 kg 112.8 kg     Physical Exam: General:  No acute distress  Head:  Normocephalic, atraumatic. Moist oral mucosal membranes  Eyes:  Anicteric  Neck:  Supple  Lungs:   Clear to auscultation, normal effort  Heart:  S1S2 no rubs  Abdomen:   Soft, nontender, bowel sounds present  Extremities:  peripheral edema.  Neurologic:  Awake, alert, following commands  Skin:  No lesions  Access:     Basic Metabolic Panel: Recent Labs  Lab 04/07/24 1929 04/07/24 2159 04/08/24 0456 04/08/24 1435 04/08/24 2140 04/09/24 1204  NA  --  143 142 142 141 141  K  --  4.5 4.6 4.6 4.7 4.9  CL  --  103 102 103 103 103  CO2  --  25 26 27 24 26   GLUCOSE  --  159* 171* 232* 232* 207*  BUN  --  102* 106* 109* 116* 121*  CREATININE  --  4.95* 5.18* 5.55* 5.96* 6.42*  CALCIUM   --  8.7* 9.0 9.1 8.6* 8.9  MG 2.9*  --   --   --   --   --    GFR: Estimated Creatinine Clearance: 14.6 mL/min (A) (by C-G formula based on SCr of 6.42 mg/dL (H)).  Liver Function Tests: Recent Labs  Lab 04/07/24 1408  AST 26  ALT 16  ALKPHOS 203*  BILITOT 0.8  PROT 7.3  ALBUMIN  4.1   No results for input(s): LIPASE, AMYLASE in the last 168 hours. No results for input(s): AMMONIA in the last 168 hours.  CBC: Recent Labs  Lab 04/07/24 1408 04/08/24 0456  WBC 4.5 2.1*  NEUTROABS 3.6  --   HGB 11.3* 11.0*  HCT 38.1* 36.9*  MCV  102.7* 103.1*  PLT 82* 72*     HbA1C: Hemoglobin A1C  Date/Time Value Ref Range Status  10/09/2017 12:00 AM 7.5  Final  06/20/2017 12:00 AM 7.5  Final   Hgb A1c MFr Bld  Date/Time Value Ref Range Status  11/06/2023 10:56 AM 5.5 4.8 - 5.6 % Final    Comment:    (NOTE) Diagnosis of Diabetes The following HbA1c ranges recommended by the American Diabetes Association (ADA) may be used as an aid in the diagnosis of diabetes mellitus.  Hemoglobin             Suggested A1C NGSP%              Diagnosis  <5.7                   Non Diabetic  5.7-6.4                Pre-Diabetic  >6.4                   Diabetic  <7.0  Glycemic control for                       adults with diabetes.    04/09/2023 03:43 AM 7.7 (H) 4.8 - 5.6 % Final    Comment:    (NOTE) Pre diabetes:          5.7%-6.4%  Diabetes:              >6.4%  Glycemic control for   <7.0% adults with diabetes     Urinalysis: Recent Labs    04/07/24 1929  COLORURINE YELLOW*  LABSPEC 1.013  PHURINE 5.0  GLUCOSEU 50*  HGBUR SMALL*  BILIRUBINUR NEGATIVE  KETONESUR NEGATIVE  PROTEINUR 30*  NITRITE NEGATIVE  LEUKOCYTESUR SMALL*      Imaging: DG C-Arm 1-60 Min-No Report Result Date: 04/10/2024 Fluoroscopy was utilized by the requesting physician.  No radiographic interpretation.     Medications:     [MAR Hold] apixaban   5 mg Oral BID   [MAR Hold] Chlorhexidine  Gluconate Cloth  6 each Topical Q0600   [MAR Hold] furosemide   80 mg Intravenous BID   [MAR Hold] insulin  aspart  0-5 Units Subcutaneous QHS   [MAR Hold] insulin  aspart  0-9 Units Subcutaneous TID WC   [MAR Hold] mycophenolate   360 mg Oral q morning   And   [MAR Hold] mycophenolate   180 mg Oral QHS   [MAR Hold] predniSONE   5 mg Oral Daily   [MAR Hold] rosuvastatin   10 mg Oral Daily   [MAR Hold] sodium chloride  flush  3 mL Intravenous Q12H   [MAR Hold] tacrolimus   1 mg Oral QHS   [MAR Hold] tacrolimus   2 mg Oral q morning    [MAR Hold] tamsulosin   0.4 mg Oral Daily    Assessment/ Plan:     64 y.o. year old male with medical history of HTN, HLD, T2DM, CHF (EF 45%) and 09/2023, chronic atrial fibrillation, BPH, OSA, ESRD status post renal transplant presenting to the ED with worsening shortness of breath. Patient has been having URI symptoms and now in respiratory distress.  He also had acute kidney injury on chronic kidney disease.  #1: Acute kidney injury on chronic kidney disease: Patient had history of renal transplant in 2012.  Now possibly failing.  Patient and family agreed for dialysis.  AV access did not work.  Called vascular and had his permacath placed.  Will attempt dialysis either tonight or tomorrow morning.  #2: History of renal transplant: Will continue immunosuppressives for now  #3: Anemia: Will continue to monitor closely.  #4: Respiratory failure/CHF: Continue diuretics as ordered.  #5: Diabetes: Continue insulin  protocols per  Labs and medications reviewed. Will continue to follow along with you.   LOS: 2 Pinkey Edman, MD [facility] 1/17/20263:01 PM  "

## 2024-04-10 NOTE — Plan of Care (Signed)
   Problem: Education: Goal: Ability to describe self-care measures that may prevent or decrease complications (Diabetes Survival Skills Education) will improve Outcome: Progressing Goal: Individualized Educational Video(s) Outcome: Progressing

## 2024-04-10 NOTE — Progress Notes (Signed)
Report given to HD nurse  

## 2024-04-10 NOTE — Transfer of Care (Signed)
 Immediate Anesthesia Transfer of Care Note  Patient: [name].  Procedure(s) Performed: INSERTION OF DIALYSIS CATHETER  Patient Location: PACU  Anesthesia Type:MAC  Level of Consciousness: drowsy  Airway & Oxygen  Therapy: Patient Spontanous Breathing and Patient connected to face mask oxygen   Post-op Assessment: Report given to RN and Post -op Vital signs reviewed and stable  Post vital signs: Reviewed  Last Vitals:  Vitals Value Taken Time  BP 97/55   Temp    Pulse 92   Resp 22   SpO2 95     Last Pain:  Vitals:   04/10/24 1200  TempSrc: Axillary  PainSc:          Complications: No notable events documented.

## 2024-04-10 NOTE — Progress Notes (Signed)
 "  Cardiology Progress Note   Patient Name: [name]. Date of Encounter: 04/10/2024  Primary Cardiologist: Deatrice Cage, MD  Subjective   Just returned from dialysis.  Very groggy.  He says dialysis makes him tired and he does not like it.  Denies chest pain or dyspnea at this time. Objective   Inpatient Medications    Scheduled Meds:  apixaban   5 mg Oral BID   Chlorhexidine  Gluconate Cloth  6 each Topical Q0600   furosemide   80 mg Intravenous BID   insulin  aspart  0-5 Units Subcutaneous QHS   insulin  aspart  0-9 Units Subcutaneous TID WC   mycophenolate   360 mg Oral q morning   And   mycophenolate   180 mg Oral QHS   predniSONE   5 mg Oral Daily   sodium chloride  flush  3 mL Intravenous Q12H   tacrolimus   1 mg Oral QHS   tacrolimus   2 mg Oral q morning   tamsulosin   0.4 mg Oral Daily   Continuous Infusions:  PRN Meds: acetaminophen  **OR** acetaminophen , oxyCODONE  **AND** acetaminophen , ondansetron  **OR** ondansetron  (ZOFRAN ) IV, senna-docusate   Vital Signs    Vitals:   04/10/24 0345 04/10/24 0500 04/10/24 0509 04/10/24 0714  BP: (!) 116/48  106/69   Pulse: 73  93   Resp: 19   13  Temp: 97.8 F (36.6 C)     TempSrc:      SpO2: 92%     Weight:  117 kg    Height:        Intake/Output Summary (Last 24 hours) at 04/10/2024 0750 Last data filed at 04/09/2024 1700 Gross per 24 hour  Intake --  Output 0 ml  Net 0 ml   Filed Weights   04/07/24 2119 04/09/24 2049 04/10/24 0500  Weight: 114.6 kg 114 kg 117 kg    Physical Exam   GEN: Well nourished, well developed, in no acute distress.  HEENT: Grossly normal.  Neck: Supple, no JVD, carotid bruits, or masses. Cardiac: Irregularly irregular, 2 out of 6 stock murmur heard throughout.  No rubs or gallops. No clubbing, cyanosis, edema.  Radials 2+, DP/PT 1+ and equal bilaterally.  Right lower extremity is wrapped. Respiratory:  Respirations regular and unlabored, diminished breath sounds  bilaterally-poor effort. GI: Soft, nontender, nondistended, BS + x 4. MS: no deformity or atrophy. Skin: warm and dry, no rash. Neuro:  Strength and sensation are intact. Psych: AAOx3 upon awakening.  Groggy.  Normal affect.  Labs    Chemistry Recent Labs  Lab 04/07/24 1408 04/07/24 2159 04/08/24 1435 04/08/24 2140 04/09/24 1204  NA 144   < > 142 141 141  K 4.3   < > 4.6 4.7 4.9  CL 104   < > 103 103 103  CO2 25   < > 27 24 26   GLUCOSE 121*   < > 232* 232* 207*  BUN 100*   < > 109* 116* 121*  CREATININE 4.75*   < > 5.55* 5.96* 6.42*  CALCIUM  8.9   < > 9.1 8.6* 8.9  PROT 7.3  --   --   --   --   ALBUMIN  4.1  --   --   --   --   AST 26  --   --   --   --   ALT 16  --   --   --   --   ALKPHOS 203*  --   --   --   --  BILITOT 0.8  --   --   --   --   GFRNONAA 13*   < > 11* 10* 9*  ANIONGAP 14   < > 12 14 13    < > = values in this interval not displayed.     Hematology Recent Labs  Lab 04/07/24 1408 04/08/24 0456  WBC 4.5 2.1*  RBC 3.71* 3.58*  HGB 11.3* 11.0*  HCT 38.1* 36.9*  MCV 102.7* 103.1*  MCH 30.5 30.7  MCHC 29.7* 29.8*  RDW 16.9* 16.8*  PLT 82* 72*    Cardiac Enzymes  Recent Labs  Lab 04/07/24 1408 04/07/24 1929  TRNPT 129* 118*    BNP    Component Value Date/Time   BNP 585.6 (H) 02/17/2024 1145   BNP 544.9 (H) 01/26/2024 1024    ProBNP    Component Value Date/Time   PROBNP >35,000.0 (H) 04/07/2024 1408   PROBNP 4,402 (H) 10/21/2022 1214    Lipids  Lab Results  Component Value Date   CHOL 72 (L) 08/21/2023   HDL 37 (L) 08/21/2023   LDLCALC 18 08/21/2023   LDLDIRECT 41.9 04/20/2012   TRIG 83 08/21/2023   CHOLHDL 1.9 08/21/2023    HbA1c  Lab Results  Component Value Date   HGBA1C 5.5 11/06/2023    Radiology    US  RENAL Result Date: 04/07/2024 EXAM: RETROPERITONEAL ULTRASOUND OF THE KIDNEYS 04/07/2024 07:43:14 PM TECHNIQUE: Real-time ultrasonography of the retroperitoneum, specifically the kidneys and urinary bladder, was  performed. COMPARISON: None available. CLINICAL HISTORY: AKI (acute kidney injury) FINDINGS: RIGHT KIDNEY: The native right kidney is atrophic and echogenic in keeping with chronic renal insufficiency, and is largely obscured by overlying bowel gas. This is not well assessed on this examination. There is no hydronephrosis involving the visualized portion of the native right kidney. LEFT KIDNEY: The native left kidney is atrophic and echogenic in keeping with chronic renal insufficiency, and is largely obscured by overlying bowel gas. This is not well assessed on this examination. There is no hydronephrosis involving the visualized portion of the native left kidney. TRANSPLANT KIDNEY: Right lower quadrant transplant kidney is identified. The transplant kidney measures 12.1 x 6.5 x 6.7 cm. Volume: 273.6 cm. No hydronephrosis. No perinephric fluid collection. Normal cortical thickness and echogenicity. No intrarenal masses or calcifications are seen. Normal color flow vascularity. BLADDER: Limited images of the bladder are unremarkable. IMPRESSION: 1. Right lower quadrant transplant kidney without hydronephrosis or perinephric fluid collection. 2. Atrophic and echogenic native kidneys, consistent with chronic renal insufficiency, largely obscured by overlying bowel gas, with no hydronephrosis involving the visualized portions. Electronically signed by: Dorethia Molt MD 04/07/2024 07:49 PM EST RP Workstation: [id number]   DG Chest Portable 1 View Result Date: 04/07/2024 CLINICAL DATA:  Short of breath, cough EXAM: PORTABLE CHEST 1 VIEW COMPARISON:  01/26/2024 FINDINGS: Single frontal view of the chest demonstrates postsurgical changes from CABG. Cardiac silhouette remains enlarged. There is increased pulmonary vascular congestion with patchy bilateral airspace disease greatest at the left lung base. Trace bilateral pleural effusions. No pneumothorax. IMPRESSION: 1. Congestive heart failure, with worsening volume  status since prior study. Electronically Signed   By: Ozell Daring M.D.   On: 04/07/2024 14:56     Telemetry    Atrial fibrillation, 70s to 80s, PVCs, 4 beats of nonsustained VT- Personally Reviewed  Cardiac Studies   2D Echocardiogram 1.15.2026    1. Left ventricular ejection fraction, by estimation, is 40 to 45%. Left  ventricular ejection fraction by PLAX is 48 %.  The left ventricle has  mildly decreased function. The left ventricle demonstrates regional wall  motion abnormalities (septal motion  consistent with bundle branch block).   2. The left ventricular internal cavity size was mildly dilated. There is  mild left ventricular hypertrophy. Left ventricular diastolic parameters  are indeterminate.   3. Right ventricular systolic function is low normal. The right  ventricular size is mildly enlarged. There is moderately elevated  pulmonary artery systolic pressure. The estimated right ventricular  systolic pressure is 47.5 mmHg.   4. Left atrial size was moderately dilated.   5. The mitral valve is normal in structure. Mild to moderate mitral valve  regurgitation. No evidence of mitral stenosis. The mean mitral valve  gradient is 4.0 mmHg. Moderate mitral annular calcification.   6. The aortic valve is normal in structure. Aortic valve regurgitation is  not visualized. No aortic stenosis is present.   7. The inferior vena cava is normal in size with greater than 50%  respiratory variability, suggesting right atrial pressure of 3 mmHg.  _____________   Patient Profile     64 y.o. male with a history of CAD status post CABG in 2005, permanent atrial fibrillation on Eliquis , end-stage renal disease s/p renal transplant 2012, ischemic cardiomyopathy, chronic heart failure with midrange ejection fraction, pulmonary hypertension, hypertension, hyperlipidemia, obesity status post gastric bypass, BPH, gout, and anemia, who was admitted January 14 with respiratory failure,  rhinovirus, acute CHF/volume overload, and acute on chronic renal failure.   Assessment & Plan    1. Acute on chronic heart failure with midrange ejection fraction/ischemic cardiomyopathy: History of midrange ejection fraction with echo on January 15 again showing an EF of 40 to 45% with mild LVH, RVSP 47.5, and mild to moderate MR. Patient presented with 7 to 10-day history of upper respiratory symptoms followed by reduction in urine output and dyspnea. proBNP greater than 35,000 with chest x-ray showing CHF on admission.  He did not respond well to IV Lasix  and was placed on dialysis yesterday with repeat session this morning.  Currently feels tired but breathing improved.  Home doses of lisinopril  and Farxiga  remain on hold in the setting of acute renal failure.  Volume management per nephrology.  2.  Stage renal disease status post renal transplant/acute kidney injury: Now receiving hemodialysis.  Per nephrology.  3.  CAD:  Status post prior CABG with nonischemic Myoview  in December 2022. He has not been experiencing chest pain. In setting of volume overload and AKI, troponin is elevated at 129 with a follow-up value of 118. Suspect demand ischemia in the setting of heart failure and AKI. Follow-up limited echo showed stable, midrange LV dysfunction with EF of 40 to 45%. He is not on aspirin  in setting of Eliquis . No beta-blocker in the setting history of bradycardia.  Resume statin.  4.  Permanent atrial fibrillation: Rate controlled in the absence of AV nodal blocking agents due to history of bradycardia.  Chronically anticoagulated with apixaban .  5.  Primary hypertension: Stable.  6.  Hyperlipidemia: Resume rosuvastatin .  7.  Microcytic anemia: Stable by lab work on the 15th.  8.  UTI: Completed course of ceftriaxone .  9.  Rhinovirus: Supportive care per primary team.  Signed, Lonni Meager, NP  04/10/2024, 7:50 AM    For questions or updates, please contact   Please consult  [email or web address] for contact info under Cardiology/STEMI.  "

## 2024-04-10 NOTE — Consult Note (Signed)
 "      VASCULAR SURGERY CONSULTATION   Requested by:  Leita Blanch, MD  Reason for consultation:  dialysis catheter placement    History of Present Illness   Carl [name] [name]. is a 64 y.o. ([birth year]) male s/p CKT who presents with cc: acute renal failure.  Pt has a failing kidney transplant and non-functional LUA AVF.  Pt has been in hospital since 04/07/24 with SOB felt to be related worsening renal function related to failing renal transplant.  Looks like attempts to transfer to renal transplant center have been unsuccessful.  Reported two failed attempt to run patient on HD via LEFT UA AVF, placed at outside facility.  Unclear why Vascular Surgery was NOT consult to try to recannulate such.  Renal reportedly feels patient need urgent HD at this point, hence request for [facility].  Past Medical History:  Diagnosis Date   Anal condyloma    Anemia    Anxiety    Aortic atherosclerosis    Aortic stenosis    a.) TTE 10/2021: Mod AS. AVA 1.06cm^2 (VTI). Mean grad ; b.) TTE 04/02/2022: no AV stenosis   Asthma, persistent controlled 02/25/2013   Attention or concentration deficit 04/26/2021   BPH with obstruction/lower urinary tract symptoms 09/25/2014   CAD (coronary artery disease) 2005   a.) LHC 1996 -> HG stenosis mLAD -> PTCA/PCI with stent x 1 (unk type) -> complicated by CFA pseudoanurysm (required surg);  b.) LHC 08/10/2002  -> 95% LAD, 70% ISR LAD, 80% pOM, 70% mRCA --> CVTS consult; c.) 4v CABG 08/13/2002; c.) 03/2018 MV: Small, fixed inferoapical and apical sep defect. No isc. EF 47% (60-65% by 05/2018 TTE); d.) 02/2021 MV: small Apical inf fixed defect. No isc -> low risk.   Cardiomegaly    Cellulitis and abscess of left leg 07/22/2020   Chronic atrial fibrillation    a.) CHA2DS2VASc = 4 (CHF, HTN, vascular disease history, T2DM);  b.) rate/rhythm maintained without pharmacological intervention; chronically anticoagulated with apixaban    Chronic combined systolic and  diastolic CHF (congestive heart failure)    a.) TTE 7/18 : EF 45-50%; b.) TTE 05/2018 : EF 60-65%, RVSP 74.8; c.) TTE 12/2018: EF 50-55%. Sev dil LA; d.) TTE 04/2019: EF 45-50%; e.) TTE 07/2021: EF 40-45%, G3DD; f.) TTE 10/2021: EF 40-45%, mild conc LVH, septal-lat dyssynchrony (LBBB), mildly red RVSF, mild-mod dil LA, mildly dil RA, mild-mod MS, mod AS; g.) TTE 04/02/2022: EF 40-45%, glob HK, LVH, red RVSF, RVE, sev LAE, mild-mod RAE, mild MR   Chronic venous stasis dermatitis of both lower extremities    Complicated UTI (urinary tract infection) 06/24/2022   Cytomegaloviral disease 2017   Diverticulosis of colon 04/22/2013   Elevated RIGHT hemidiaphragm    Erectile dysfunction    a.) on topical TRT + Trimix (alprostadil/papaverine/phentolamine) injections   ESRD (end stage renal disease) (HCC)    a.) dialysis dependent 2004-2012; b.) s/p cadaveric RIGHT renal transplant 09/13/2010   Essential hypertension 12/17/2010   Gout    History of 2019 novel coronavirus disease (COVID-19) 06/30/2019   History of bilateral cataract extraction 10/2017   History of renal transplant 09/13/2010   a.) s/p cadaveric donor transplant 09/13/2010   Hx of bilateral cataract extraction 10/2017   Hyperlipidemia    Hypogonadism in male 09/25/2014   Ischemic cardiomyopathy    Left cephalic vein thrombosis 06/2019   Long term current use of anticoagulant    a.) apixaban    Long term current use of immunosuppressive drug  a.) mycophenolate  + prednisone  + tacrolimus    Major depressive disorder 10/04/2013   Mitral stenosis 11/07/2021   a.) TTE 11/07/2021: severe MAC, mild-mod MS (MPG 6 mmHg); b.) TTE 04/02/2022: no MV stenosis   Murmur    Obesity hypoventilation syndrome 03/31/2012   OSA treated with BiPAP 09/29/2012   PAH (pulmonary artery hypertension)    a. 04/2019 RHC: RA 19, RV 80/20, PA 78/31 (51), PCWP 25, CO/CI 7.69/3.26. PVR 3.25 --> Sev PAH, likely primarily PV HTN; b.) RHC 04/04/2022: mRA 13, mPA 40,  mPCWP 20, PA sat 59, AO sat 92, CO 7.63, CI 3.26, PVR 2.6 --> mod portal venous HTN   Pancreatitis    S/P CABG x 4 08/13/2002   a.) LIMA-LAD, LRA-OM, SVG-D1, SVG-dRCA   S/P gastric bypass    Synovitis of finger 06/11/2019   Trigger finger, unspecified little finger 12/23/2018   Type 2 diabetes mellitus with hyperglycemia, with long-term current use of insulin  09/09/2014   a.) has FreeStyle Libre CGM   Ulcer    Left shin goes to wound care center at [facility] qweek   Wears glasses    Wears hearing aid in both ears     Past Surgical History:  Procedure Laterality Date   CARDIAC CATHETERIZATION N/A 08/10/2002   CATARACT EXTRACTION W/PHACO Right 10/29/2017   Procedure: CATARACT EXTRACTION PHACO AND INTRAOCULAR LENS PLACEMENT (IOC)  RIGHT DIABETIC;  Surgeon: Mittie Gaskin, MD;  Location: [facility];  Service: Ophthalmology;  Laterality: Right   CATARACT EXTRACTION W/PHACO Left 11/18/2017   Procedure: CATARACT EXTRACTION PHACO AND INTRAOCULAR LENS PLACEMENT (IOC) LEFT IVA/TOPICAL;  Surgeon: Mittie Gaskin, MD;  Location: [facility];  Service: Ophthalmology;  Laterality: Left   COLONOSCOPY WITH PROPOFOL  N/A 04/22/2013   Procedure: COLONOSCOPY WITH PROPOFOL ;  Surgeon: Toribio SHAUNNA Cedar, MD;  Location: [facility];  Service: Endoscopy;  Laterality: N/A;   CORONARY ANGIOPLASTY WITH STENT PLACEMENT N/A 1996   CORONARY ARTERY BYPASS GRAFT N/A 08/13/2002   Procedure: 4v CORONARY ARTERY BYPASS GRAFTING; Location: [facility]; Surgeon: Dessa Laine, MD   CYSTOSCOPY WITH INSERTION OF UROLIFT N/A 12/25/2021   Procedure: CYSTOSCOPY WITH INSERTION OF UROLIFT;  Surgeon: Twylla Glendia BROCKS, MD;  Location: [facility];  Service: Urology;  Laterality: N/A;   DG ANGIO AV SHUNT*L*     right and left upper arms   FASCIOTOMY  03/03/2012   Procedure: FASCIOTOMY;  Surgeon: Arley JONELLE Curia, MD;  Location: [facility];  Service: Orthopedics;  Laterality: Right;  FASCIOTOMY RIGHT  SMALL FINGER   FASCIOTOMY Left 08/17/2013   Procedure: FASCIOTOMY LEFT RING;  Surgeon: Arley JONELLE Curia, MD;  Location: [facility];  Service: Orthopedics;  Laterality: Left;   INCISION AND DRAINAGE ABSCESS Left 10/15/2015   Procedure: INCISION AND DRAINAGE ABSCESS;  Surgeon: Laneta JULIANNA Luna, MD;  Location: [facility];  Service: General;  Laterality: Left;   KIDNEY TRANSPLANT  09/13/2010   cadaver--at [facility]   LASER ABLATION CONDOLAMATA N/A 01/08/2023   Procedure: LASER REMOVAL ABLATION OF CONDYLOMATA;  Surgeon: Sheldon Standing, MD;  Location: [facility];  Service: General;  Laterality: N/A;  GEN AND LOCAL   LASER ABLATION CONDOLAMATA N/A 11/20/2023   Procedure: ABLATION, CONDYLOMA, USING LASER HEMORRHOIDAL LIGATION & HEMORRHOIDOPEXY, HEMORRHOIDECTOMY x 2;  Surgeon: Sheldon Standing, MD;  Location: [facility];  Service: General;  Laterality: N/A;  LASER REMOVAL ABLATION OF CONDYLOMATA ANORECTAL EXAMINATION UNDER ANESTHESIA   RECTAL EXAM UNDER ANESTHESIA N/A 01/08/2023   Procedure: ANORECTAL EXAM UNDER ANESTHESIA;  Surgeon: Sheldon,  Elspeth, MD;  Location: [facility];  Service: General;  Laterality: N/A;   RECTAL EXAM UNDER ANESTHESIA N/A 11/20/2023   Procedure: EXAM UNDER ANESTHESIA, RECTUM;  Surgeon: Sheldon Elspeth, MD;  Location: [facility];  Service: General;  Laterality: N/A;   RIGHT HEART CATH N/A 11/15/2016   Procedure: RIGHT HEART CATH;  Surgeon: Darron Deatrice LABOR, MD;  Location: [facility];  Service: Cardiovascular;  Laterality: N/A;   RIGHT HEART CATH N/A 05/03/2019   Procedure: RIGHT HEART CATH;  Surgeon: [name], MD;  Location: [facility]VAS[facility] CV LAB;  Service: Cardiovascular;  Laterality: N/A;   RIGHT HEART CATH N/A 04/04/2022   Procedure: RIGHT HEART CATH;  Surgeon: [name], MD;  Location: [facility] INVASIVE CV LAB;  Service: Cardiovascular;  Laterality: N/A;   ROUX-EN-Y GASTRIC BYPASS N/A 2015   TYMPANIC MEMBRANE REPAIR Left 03/2010   VASECTOMY     WART FULGURATION N/A 05/31/2022    Procedure: FULGURATION ANAL WART;  Surgeon: Tye Millet, DO;  Location: [facility];  Service: General;  Laterality: N/A;     Social History   Socioeconomic History   Marital status: Married    Spouse name: Not on file   Number of children: 2   Years of education: 14   Highest education level: Associate degree: academic program  Occupational History   Occupation: Presenter, Broadcasting: unemployed    Comment: disabled due to kidney failure  Tobacco Use   Smoking status: Never   Smokeless tobacco: Never  Vaping Use   Vaping status: Never Used  Substance and Sexual Activity   Alcohol use: Not Currently    Comment: occ   Drug use: No   Sexual activity: Not on file  Other Topics Concern   Not on file  Social History Narrative   Has living will   Wife is health care POA---son Donnice is alternate   Would accept resuscitation attempts   No tube feedings if cognitively unaware   Social Drivers of Health   Tobacco Use: Low Risk (04/07/2024)   Patient History    Smoking Tobacco Use: Never    Smokeless Tobacco Use: Never    Passive Exposure: Never  Recent Concern: Tobacco Use - Medium Risk (03/16/2024)   Received from [facility]   Patient History    Smoking Tobacco Use: Former    Smokeless Tobacco Use: Never    Passive Exposure: Past  Physicist, Medical Strain: Patient Declined (12/09/2023)   Overall Financial Resource Strain (CARDIA)    Difficulty of Paying Living Expenses: Patient declined  Food Insecurity: No Food Insecurity (04/09/2024)   Epic    Worried About Programme Researcher, Broadcasting/film/video in the Last Year: Never true    Ran Out of Food in the Last Year: Never true  Transportation Needs: Unmet Transportation Needs (04/09/2024)   Epic    Lack of Transportation (Medical): Yes    Lack of Transportation (Non-Medical): Yes  Physical Activity: Not on file  Stress: Not on file  Social Connections: Socially Integrated (04/11/2023)   Social Connection  and Isolation Panel    Frequency of Communication with Friends and Family: More than three times a week    Frequency of Social Gatherings with Friends and Family: More than three times a week    Attends Religious Services: More than 4 times per year    Active Member of [employer] or Organizations: Yes    Attends Banker Meetings: More than 4 times per year  Marital Status: Married  Catering Manager Violence: Not At Risk (04/09/2024)   Epic    Fear of Current or Ex-Partner: No    Emotionally Abused: No    Physically Abused: No    Sexually Abused: No  Depression (PHQ2-9): Low Risk (02/09/2024)   Depression (PHQ2-9)    PHQ-2 Score: 3  Alcohol Screen: Not on file  Housing: Low Risk (04/09/2024)   Epic    Unable to Pay for Housing in the Last Year: No    Number of Times Moved in the Last Year: 0    Homeless in the Last Year: No  Utilities: Not At Risk (04/09/2024)   Epic    Threatened with loss of utilities: No  Health Literacy: Not on file    Family History  Problem Relation Age of Onset   Heart disease Father    Kidney failure Father    Kidney disease Father    Diabetes Maternal Grandmother    Breast cancer Maternal Grandmother    Valvular heart disease Mother    Liver cancer Paternal Uncle    Liver cancer Paternal Grandmother    Prostate cancer Neg Hx     Current Facility-Administered Medications  Medication Dose Route Frequency Provider Last Rate Last Admin   acetaminophen  (TYLENOL ) tablet 650 mg  650 mg Oral Q6H PRN Khan, Ghalib, MD   650 mg at 04/09/24 1529   Or   acetaminophen  (TYLENOL ) suppository 650 mg  650 mg Rectal Q6H PRN Fernand Prost, MD       oxyCODONE  (Oxy IR/ROXICODONE ) immediate release tablet 10 mg  10 mg Oral Q6H PRN Patel, Sona, MD   10 mg at 04/09/24 2238   And   acetaminophen  (TYLENOL ) tablet 325 mg  325 mg Oral Q6H PRN Patel, Sona, MD       apixaban  (ELIQUIS ) tablet 5 mg  5 mg Oral BID Fernand Prost, MD   5 mg at 04/09/24 2238   Chlorhexidine   Gluconate Cloth 2 % PADS 6 each  6 each Topical Q0600 Druscilla Bald, NP   6 each at 04/10/24 0603   furosemide  (LASIX ) injection 80 mg  80 mg Intravenous BID Fernand Prost, MD   80 mg at 04/09/24 1912   insulin  aspart (novoLOG ) injection 0-5 Units  0-5 Units Subcutaneous QHS Fernand Prost, MD   2 Units at 04/08/24 2159   insulin  aspart (novoLOG ) injection 0-9 Units  0-9 Units Subcutaneous TID WC Fernand Prost, MD   2 Units at 04/09/24 1911   mycophenolate  (MYFORTIC ) EC tablet 360 mg  360 mg Oral q morning Fernand Prost, MD   360 mg at 04/09/24 1044   And   mycophenolate  (MYFORTIC ) EC tablet 180 mg  180 mg Oral QHS Khan, Ghalib, MD   180 mg at 04/09/24 2240   ondansetron  (ZOFRAN ) tablet 4 mg  4 mg Oral Q6H PRN Fernand Prost, MD       Or   ondansetron  (ZOFRAN ) injection 4 mg  4 mg Intravenous Q6H PRN Fernand Prost, MD       predniSONE  (DELTASONE ) tablet 5 mg  5 mg Oral Daily Fernand Prost, MD   5 mg at 04/09/24 1041   senna-docusate (Senokot-S) tablet 1 tablet  1 tablet Oral QHS PRN Fernand Prost, MD       sodium chloride  flush (NS) 0.9 % injection 3 mL  3 mL Intravenous Q12H Fernand Prost, MD   3 mL at 04/09/24 2240   tacrolimus  (PROGRAF ) capsule 1 mg  1 mg Oral QHS  Druscilla Bald, NP   1 mg at 04/09/24 2239   tacrolimus  (PROGRAF ) capsule 2 mg  2 mg Oral q morning Druscilla Bald, NP   2 mg at 04/09/24 1043   tamsulosin  (FLOMAX ) capsule 0.4 mg  0.4 mg Oral Daily Khan, Ghalib, MD   0.4 mg at 04/09/24 1042    Allergies[1]  REVIEW OF SYSTEMS (negative unless checked):   Unclear if language barrier or altered mental status as limited response to questioning  Physical Examination     Vitals:   04/10/24 0802 04/10/24 0828 04/10/24 0843 04/10/24 0955  BP:  106/60  109/65  Pulse:  74 77 78  Resp: 12 15 15 17   Temp:  97.7 F (36.5 C)  97.8 F (36.6 C)  TempSrc:  Axillary  Axillary  SpO2:  99% 98% 95%  Weight:  112.8 kg    Height:       Body mass index is 35.68 kg/m.  General  Somulent, Confused, ill appearing  Head [place]/AT,   Neck Possible R neck catheter insertion site  Pulmonary Sym exp, mild tachycardiac, coarse BS, some increased work to breathing  Cardiac RRR, Nl S1, S2, no Murmurs, +gallop  Vascular No pedal pulses palpable, no bruit or thrill in LUA  Musculo- skeletal BLE: 2+, LDS extensive, RLE bandage in place  Neurologic Somulent, limited communication    Laboratory   CBC    Latest Ref Rng & Units 04/08/2024    4:56 AM 04/07/2024    2:08 PM 02/12/2024   10:26 AM  CBC  WBC 4.0 - 10.5 K/uL 2.1  4.5  2.9   Hemoglobin 13.0 - 17.0 g/dL 88.9  88.6  89.3   Hematocrit 39.0 - 52.0 % 36.9  38.1  33.1   Platelets 150 - 400 K/uL 72  82  112     BMP    Latest Ref Rng & Units 04/09/2024   12:04 PM 04/08/2024    9:40 PM 04/08/2024    2:35 PM  BMP  Glucose 70 - 99 mg/dL 792  767  767   BUN 8 - 23 mg/dL 878  883  890   Creatinine 0.61 - 1.24 mg/dL 3.57  4.03  4.44   Sodium 135 - 145 mmol/L 141  141  142   Potassium 3.5 - 5.1 mmol/L 4.9  4.7  4.6   Chloride 98 - 111 mmol/L 103  103  103   CO2 22 - 32 mmol/L 26  24  27    Calcium  8.9 - 10.3 mg/dL 8.9  8.6  9.1     Coagulation Lab Results  Component Value Date   INR 1.8 (H) 01/26/2024   INR 1.8 (H) 11/23/2023   INR 1.7 (H) 11/22/2023   No results found for: PTT  Lipids    Component Value Date/Time   CHOL 72 (L) 08/21/2023 0952   TRIG 83 08/21/2023 0952   HDL 37 (L) 08/21/2023 0952   CHOLHDL 1.9 08/21/2023 0952   CHOLHDL 2.2 04/09/2023 0343   VLDL 17 04/09/2023 0343   LDLCALC 18 08/21/2023 0952   LDLDIRECT 41.9 04/20/2012 1618     Radiology     ECHOCARDIOGRAM COMPLETE Result Date: 04/08/2024    ECHOCARDIOGRAM REPORT   Patient Name:   Carl Wile. Date of Exam: 04/08/2024 Medical Rec #:  [id number]               Height:       70.0 in Accession #:    [id number]  Weight:       252.6 lb Date of Birth:  05/30/1960               BSA:          2.305 m Patient Age:    63  years                BP:           125/65 mmHg Patient Gender: M                       HR:           67 bpm. Exam Location:  [facility] Procedure: 2D Echo, Cardiac Doppler and Color Doppler (Both Spectral and Color            Flow Doppler were utilized during procedure). Indications:     CHF-acute systolic I50.21  History:         Patient has prior history of Echocardiogram examinations, most                  recent 09/27/2023. CAD, Arrythmias:Atrial Fibrillation; Risk                  Factors:Hypertension. Aortic stenosis.  Sonographer:     Christopher Furnace Referring Phys:  [id number] [facility] Diagnosing Phys: Evalene Lunger MD IMPRESSIONS  1. Left ventricular ejection fraction, by estimation, is 40 to 45%. Left ventricular ejection fraction by PLAX is 48 %. The left ventricle has mildly decreased function. The left ventricle demonstrates regional wall motion abnormalities (septal motion consistent with bundle branch block).  2. The left ventricular internal cavity size was mildly dilated. There is mild left ventricular hypertrophy. Left ventricular diastolic parameters are indeterminate.  3. Right ventricular systolic function is low normal. The right ventricular size is mildly enlarged. There is moderately elevated pulmonary artery systolic pressure. The estimated right ventricular systolic pressure is 47.5 mmHg.  4. Left atrial size was moderately dilated.  5. The mitral valve is normal in structure. Mild to moderate mitral valve regurgitation. No evidence of mitral stenosis. The mean mitral valve gradient is 4.0 mmHg. Moderate mitral annular calcification.  6. The aortic valve is normal in structure. Aortic valve regurgitation is not visualized. No aortic stenosis is present.  7. The inferior vena cava is normal in size with greater than 50% respiratory variability, suggesting right atrial pressure of 3 mmHg. FINDINGS  Left Ventricle: Left ventricular ejection fraction, by estimation, is 40 to 45%. Left ventricular ejection  fraction by PLAX is 48 %. The left ventricle has mildly decreased function. The left ventricle demonstrates regional wall motion abnormalities. Strain was performed and the global longitudinal strain is indeterminate. The left ventricular internal cavity size was mildly dilated. There is mild left ventricular hypertrophy. Left ventricular diastolic parameters are indeterminate. Right Ventricle: The right ventricular size is mildly enlarged. No increase in right ventricular wall thickness. Right ventricular systolic function is low normal. There is moderately elevated pulmonary artery systolic pressure. The tricuspid regurgitant  velocity is 3.26 m/s, and with an assumed right atrial pressure of 5 mmHg, the estimated right ventricular systolic pressure is 47.5 mmHg. Left Atrium: Left atrial size was moderately dilated. Right Atrium: Right atrial size was normal in size. Pericardium: There is no evidence of pericardial effusion. Mitral Valve: The mitral valve is normal in structure. Moderate mitral annular calcification. Mild to moderate mitral valve regurgitation. No evidence of mitral valve stenosis. MV peak gradient, 11.0  mmHg. The mean mitral valve gradient is 4.0 mmHg. Tricuspid Valve: The tricuspid valve is normal in structure. Tricuspid valve regurgitation is not demonstrated. No evidence of tricuspid stenosis. Aortic Valve: The aortic valve is normal in structure. Aortic valve regurgitation is not visualized. No aortic stenosis is present. Aortic valve mean gradient measures 12.7 mmHg. Aortic valve peak gradient measures 20.9 mmHg. Aortic valve area, by VTI measures 1.53 cm. Pulmonic Valve: The pulmonic valve was normal in structure. Pulmonic valve regurgitation is not visualized. No evidence of pulmonic stenosis. Aorta: The aortic root is normal in size and structure. Venous: The inferior vena cava is normal in size with greater than 50% respiratory variability, suggesting right atrial pressure of 3 mmHg.  IAS/Shunts: No atrial level shunt detected by color flow Doppler. Additional Comments: 3D was performed not requiring image post processing on an independent workstation and was indeterminate.  LEFT VENTRICLE PLAX 2D LV EF:         Left            Diastology                ventricular     LV e' medial:   5.22 cm/s                ejection        LV E/e' medial: 30.3                fraction by                PLAX is 48                %. LVIDd:         6.10 cm LVIDs:         4.60 cm LV PW:         1.40 cm LV IVS:        1.40 cm LVOT diam:     2.00 cm LV SV:         70 LV SV Index:   30 LVOT Area:     3.14 cm  LV Volumes (MOD) LV vol d, MOD    180.0 ml A2C: LV vol d, MOD    86.1 ml A4C: LV vol s, MOD    161.0 ml A2C: LV vol s, MOD    66.8 ml A4C: LV SV MOD A2C:   19.0 ml LV SV MOD A4C:   86.1 ml LV SV MOD BP:    23.1 ml RIGHT VENTRICLE RV Basal diam:  4.10 cm RV Mid diam:    4.30 cm LEFT ATRIUM             Index        RIGHT ATRIUM           Index LA diam:        5.80 cm 2.52 cm/m   RA Area:     18.00 cm LA Vol (A2C):   84.6 ml 36.70 ml/m  RA Volume:   41.10 ml  17.83 ml/m LA Vol (A4C):   82.9 ml 35.97 ml/m LA Biplane Vol: 88.7 ml 38.48 ml/m  AORTIC VALVE AV Area (Vmax):    1.42 cm AV Area (Vmean):   1.45 cm AV Area (VTI):     1.53 cm AV Vmax:           228.67 cm/s AV Vmean:          164.667 cm/s AV VTI:  0.459 m AV Peak Grad:      20.9 mmHg AV Mean Grad:      12.7 mmHg LVOT Vmax:         103.00 cm/s LVOT Vmean:        76.200 cm/s LVOT VTI:          0.223 m LVOT/AV VTI ratio: 0.49  AORTA Ao Root diam: 3.00 cm MITRAL VALVE                TRICUSPID VALVE MV Area (PHT): 3.21 cm     TR Peak grad:   42.5 mmHg MV Area VTI:   2.20 cm     TR Vmax:        326.00 cm/s MV Peak grad:  11.0 mmHg MV Mean grad:  4.0 mmHg     SHUNTS MV Vmax:       1.66 m/s     Systemic VTI:  0.22 m MV Vmean:      87.6 cm/s    Systemic Diam: 2.00 cm MV Decel Time: 236 msec MV E velocity: 158.00 cm/s Evalene Lunger MD Electronically  signed by Evalene Lunger MD Signature Date/Time: 04/08/2024/1:23:21 PM    Final      Medical Decision Making   Carl Aison Malveaux. is a 64 y.o. male who presents with: ARF, failing CKT  Pt's renal transplant and access done at outside facility so I have no access to records No way to know how long the LUA AVF has been occluded Would just place tunneled dialysis catheter to allow hemodialysis. Risks of tunneled dialysis catheter placement include but are not limited to: bleeding, infection, central venous injury, pneumothorax, possible venous stenosis, possible malpositioning in the venous system, and possible infections related to long-term catheter presence.  Patient is not able to carry on a conversation so will need to contact family or proceed with emergency consent. Agree that pt is best serve at his transplant center.  Transfer when able.  Redell Door, MD, FACS, FSVS Covering for [facility]: [phone]  04/10/2024, 10:22 AM       [1]  Allergies Allergen Reactions   Iodinated Contrast Media Other (See Comments)    Kidney transplant  Iodinated contrast media (substance)   Iodine Other (See Comments)    Kidney transplant   Nsaids Other (See Comments)    Kidney transplant  Kidney transplant    Non-steroidal anti-inflammatory agent (substance)   Other Other (See Comments)    Non-steroidal anti-inflammatory agent (substance)   Ibuprofen Other (See Comments)    Due to kidney transplant   "

## 2024-04-10 NOTE — Anesthesia Preprocedure Evaluation (Signed)
"                                    Anesthesia Evaluation  Patient identified by MRN, date of birth, ID band Patient awake    Reviewed: Allergy & Precautions, H&P , NPO status , Patient's Chart, lab work & pertinent test results  Airway Mallampati: II  TM Distance: >3 FB Neck ROM: Full    Dental no notable dental hx.    Pulmonary neg pulmonary ROS   Pulmonary exam normal breath sounds clear to auscultation       Cardiovascular hypertension, negative cardio ROS Normal cardiovascular exam Rhythm:Regular Rate:Normal     Neuro/Psych negative neurological ROS  negative psych ROS   GI/Hepatic negative GI ROS, Neg liver ROS,,,  Endo/Other  negative endocrine ROSdiabetes    Renal/GU negative Renal ROS  negative genitourinary   Musculoskeletal negative musculoskeletal ROS (+)    Abdominal   Peds negative pediatric ROS (+)  Hematology negative hematology ROS (+)   Anesthesia Other Findings   Reproductive/Obstetrics negative OB ROS                              Anesthesia Physical Anesthesia Plan  ASA: 4  Anesthesia Plan: MAC   Post-op Pain Management:    Induction:   PONV Risk Score and Plan:   Airway Management Planned:   Additional Equipment:   Intra-op Plan:   Post-operative Plan:   Informed Consent:   Plan Discussed with: CRNA  Anesthesia Plan Comments:         Anesthesia Quick Evaluation  "

## 2024-04-10 NOTE — Progress Notes (Signed)
 Pt was seen in HD today. Left AVF was still swollen from infiltration from yesterday tx. and unable to hear bruit in area of swelling.SABRA Charge nurse made aware of issues in the venous area of fistula. 2 attempts was made by charge nurse after approval from Nephrologist Korrapati MD to try to access the fistula.  Both Attempts was unsuccessful. Pt c/o pain in the area and 2nd attempt began to look infiltrated. Notified Nephrologist and Floor nurse. Vascular was called. No other issues at this time. Vs stable Pt was sent back to room for further evaluation.

## 2024-04-10 NOTE — Progress Notes (Signed)
" °  °  °  Daily Progress Note   Called the patient's spouse, who is going to discuss placement of [facility] with her son.   Reportedly, the family was considering comfort measures previously and forgoing additional HD.   Redell Door, MD, FACS, FSVS Covering for [facility]: [phone]  04/10/2024, 10:46 AM       "

## 2024-04-10 NOTE — Progress Notes (Signed)
 [facility]  - [place] at [facility]   PATIENT NAME: [name]    MR#:  [id number]  DATE OF BIRTH:  01/14/1961  SUBJECTIVE:   Pt's wife at bedside. Patient seen earlier. Nephrology was unable to do dialysis through the existing fistula. [name] vascular consulted temporary dialysis catheter will be placed. VITALS:  Blood pressure (!) 93/47, pulse 79, temperature 97.7 F (36.5 C), resp. rate 18, height 5' 10 (1.778 m), weight 112.8 kg, SpO2 97%.  PHYSICAL EXAMINATION:   GENERAL:  64 y.o.-year-old patient with mild acute distress. Obese, chronically ill LUNGS: distant breath sounds bilaterally, no wheezing CARDIOVASCULAR: S1, S2 normal. No murmur   ABDOMEN: Soft, nontender, nondistended. Bowel sounds present.  EXTREMITIES: anasarca  , bilateral LE venous stasis with hyperpigmentation NEUROLOGIC: nonfocal  patient is alert and awake   LABORATORY PANEL:  CBC Recent Labs  Lab 04/08/24 0456  WBC 2.1*  HGB 11.0*  HCT 36.9*  PLT 72*    Chemistries  Recent Labs  Lab 04/07/24 1408 04/07/24 1929 04/07/24 2159 04/09/24 1204  NA 144  --    < > 141  K 4.3  --    < > 4.9  CL 104  --    < > 103  CO2 25  --    < > 26  GLUCOSE 121*  --    < > 207*  BUN 100*  --    < > 121*  CREATININE 4.75*  --    < > 6.42*  CALCIUM  8.9  --    < > 8.9  MG  --  2.9*  --   --   AST 26  --   --   --   ALT 16  --   --   --   ALKPHOS 203*  --   --   --   BILITOT 0.8  --   --   --    < > = values in this interval not displayed.   Cardiac Enzymes No results for input(s): TROPONINI in the last 168 hours. RADIOLOGY:  DG C-Arm 1-60 Min-No Report Result Date: 04/10/2024 Fluoroscopy was utilized by the requesting physician.  No radiographic interpretation.    Assessment and Plan  [name]. is a 64 y.o. year old male with medical history of HTN, HLD, T2DM, CHF (EF 45%) and 09/2023, chronic atrial fibrillation, BPH, OSA, ESRD status post renal transplant  presenting to the ED with worsening shortness of breath.  Patient has been having URI symptoms for about 10 days.  His symptoms include shortness of breath, coughing but patient denies any fevers or chills. States his breathing has worsened over the last 3 days. He states his medications were adjusted a few days ago.  He reports that he is holding onto fluid in his body and just needs lasix .  Denies any chest pain  per family patient had not been eating and drinking well for past few days.  His baseline creatinine is around 2.8-- 3.0 patient came in with creatinine of more than 5.0 workup in the ER showed clinical dehydration along with significant peripheral edema patient is on the transfer list to [facility] in [place]  acute on chronic congestive heart failure diastolic with EF of 40 to 45% significant edema/anasarca -- started on IV Lasix  per [name] nephrology -- [facility] cardiology consultation with [name] -- holding losartan  and hydralazine   Acute on chronic kidney disease stage IIIB -- patient is deceased donor kidney  transplant June 2012-- follows with [name] [place] -- patient is on the transfer list however no beds -- acute kidney injury secondary to dehydration. Patient has had persistent diarrhea and poor PO intake for few days.- IV fluids at 40 mL an hour -- per nephrology continue outpatient immunosuppressive medications, prednisone , mycophenolate , tacrolimus - -- creat 4.9--5.18--5.55--5.96 -- further management per nephrology --1/16--pt to be started on HD --1/17-- malfunctioning existing AV fistula. Vascular surgery consultation with Dr. Laurence who will place temporary dialysis catheter.  A fib appears in sinus rhythm -- on eliquis  -- not on any rate blocking agent due to history of bradycardia  MDD -- takes Wellbutrin  at home--holding  Type II diabetes -- sliding scale insulin  for now -- hold farxiga , gabapentin ,  mounjaro   Hypertension Holding home losartan  and hydralazine  as patient is getting IV diuresis.  Can restart hydralazine  but will hold losartan  until AKI resolves.   GERD: Continue home PPI   Hyperlipidemia: Continue home statin  Procedures: Family communication : patient's wife Consults : nephrology, cardiology [facility] CODE STATUS: full DVT Prophylaxis : eliquis  Level of care: Progressive Status is: Inpatient Remains inpatient appropriate because: worsening chronic kidney disease--now needing HD  Pt remains on Transfer list to [facility])    TOTAL TIME TAKING CARE OF THIS PATIENT: 45 minutes.  >50% time spent on counselling and coordination of care  Note: This dictation was prepared with Dragon dictation along with smaller phrase technology. Any transcriptional errors that result from this process are unintentional.  Leita Blanch M.D    [facility]   CC: Primary care physician; [name], MD

## 2024-04-10 NOTE — Anesthesia Postprocedure Evaluation (Signed)
"   Anesthesia Post Note  Patient: [name].  Procedure(s) Performed: INSERTION OF DIALYSIS CATHETER  Patient location during evaluation: PACU Anesthesia Type: MAC Level of consciousness: awake and alert Pain management: pain level controlled Vital Signs Assessment: post-procedure vital signs reviewed and stable Respiratory status: spontaneous breathing, nonlabored ventilation, respiratory function stable and patient connected to nasal cannula oxygen  Cardiovascular status: blood pressure returned to baseline and stable Postop Assessment: no apparent nausea or vomiting Anesthetic complications: no   No notable events documented.   Last Vitals:  Vitals:   04/10/24 1520 04/10/24 1530  BP: (!) 104/58 (!) 98/55  Pulse: 89 87  Resp: 17 (!) 28  Temp: (!) 36.1 C   SpO2: 94% 92%    Last Pain:  Vitals:   04/10/24 1525  TempSrc:   PainSc: 0-No pain                 Fairy A Ramsha Lonigro      "

## 2024-04-10 NOTE — Op Note (Addendum)
 " OPERATIVE NOTE  PROCEDURE: 1.  Right internal jugular vein tunneled dialysis catheter placement 2.  Right internal jugular vein cannulation under ultrasound guidance  PRE-OPERATIVE DIAGNOSIS: end-stage renal failure  POST-OPERATIVE DIAGNOSIS: same as above  SURGEON: Redell Door, MD  ANESTHESIA: local and MAC  ESTIMATED BLOOD LOSS: 30 cc  FINDING(S): 1.  Tips of the catheter in the right atrium on fluoroscopy 2.  No obvious pneumothorax on fluoroscopy  SPECIMEN(S):  none  INDICATIONS:   [name]. is a 64 y.o. male who presents with hemodialysis dependence.  The patient presents for tunneled dialysis catheter placement.  The patient;s wife is aware the risks of tunneled dialysis catheter placement include but are not limited to: bleeding, infection, central venous injury, pneumothorax, possible venous stenosis, possible malpositioning in the venous system, and possible infections related to long-term catheter presence.  The patient's wife was aware of these risks and agreed to proceed.   DESCRIPTION: After written full informed consent was obtained from the patient, the patient was taken back to the operating room.  Prior to induction, the patient was given IV antibiotics.  After obtaining adequate sedation, the patient was prepped and draped in the standard fashion for a chest or neck tunneled dialysis catheter placement.     Note, throughout this case, the patient was confused and active interfered with care.  Multiple staff members had to help restrain the patient as the anesthesia team worked hard to maintain this patient's airway.  The cannulation site, the catheter exit site, and tract for the subcutaneous tunnel were then anesthestized with a total of 20 cc of 1% lidocaine  without epinephrine .  Under ultrasound guidance, the right internal jugular vein was cannulated with the 18 gauge needle.  A J-wire was then placed down into the inferior vena cava under  fluoroscopic guidance.  The wire was then secured in place with a clamp to the drapes.  The tapered wire guide was left occluding the lumen of the needle to avoid air embolism.    I then made stab incisions at the neck and exit sites.   I dissected from the exit site to the cannulation site with a tunneler.   The wire was then unclamped and I removed the needle.  The skin tract and venotomy was dilated serially with difficulty with graduated dilators, passed under fluoroscopic guidance.   Finally, the dilator-sheath was placed under fluoroscopic guidance into the superior vena cava.  The central dilator and wire were removed.  A 23 cm Palindrome catheter was placed under fluoroscopic guidance down into the right atrium.    The sheath was broken and peeled away while holding the catheter cuff at the level of the skin.  The catheter was clamped with the plastic clamp and the back end of this catheter was transected.  One lumen was docked onto the tunneler.  The catheter was delivered through the subcutaneous tunnel by pulling the tunneler out the exit site.  The catheter was reclamped and was transected a second time, revealing the two lumens of this catheter.  The catheter collar was loaded over the back end of the catheter.  The two  ports were docked onto these two lumens.  The catheter collar was then snapped into place, securing the two ports.  Each port was tested by aspirating and flushing.  No resistance was noted.  Each port was then thoroughly flushed with heparinized saline.  The catheter was secured in placed with two interrupted stitches of 3-0 Nylon  tied to the catheter.  The neck incision was closed with a U-stitch of 4-0 Monocryl.  The neck and chest incision were cleaned.  The closed neck incision was reinforced with Dermabond and sterile bandages applied to the catheter exit site.  Each port was then loaded with concentrated heparin  (1000 Units/mL) at the manufacturer recommended volumes to each  port.  Sterile caps were applied to each port.    On completion fluoroscopy, the tips of the catheter were in the right atrium, and there was no evidence of pneumothorax.   COMPLICATIONS: none  CONDITION: stable  Redell Door, MD, FACS, FSVS Covering for [facility]: [phone]  04/10/2024, 2:47 PM  "

## 2024-04-11 ENCOUNTER — Encounter: Payer: Self-pay | Admitting: Vascular Surgery

## 2024-04-11 DIAGNOSIS — I5022 Chronic systolic (congestive) heart failure: Secondary | ICD-10-CM

## 2024-04-11 DIAGNOSIS — N186 End stage renal disease: Secondary | ICD-10-CM | POA: Diagnosis not present

## 2024-04-11 DIAGNOSIS — Z992 Dependence on renal dialysis: Secondary | ICD-10-CM | POA: Diagnosis not present

## 2024-04-11 DIAGNOSIS — N185 Chronic kidney disease, stage 5: Secondary | ICD-10-CM | POA: Diagnosis not present

## 2024-04-11 DIAGNOSIS — I509 Heart failure, unspecified: Secondary | ICD-10-CM | POA: Diagnosis not present

## 2024-04-11 DIAGNOSIS — N170 Acute kidney failure with tubular necrosis: Secondary | ICD-10-CM | POA: Diagnosis not present

## 2024-04-11 DIAGNOSIS — Z515 Encounter for palliative care: Secondary | ICD-10-CM | POA: Diagnosis not present

## 2024-04-11 DIAGNOSIS — Z7189 Other specified counseling: Secondary | ICD-10-CM | POA: Diagnosis not present

## 2024-04-11 DIAGNOSIS — I482 Chronic atrial fibrillation, unspecified: Secondary | ICD-10-CM | POA: Diagnosis not present

## 2024-04-11 DIAGNOSIS — J9601 Acute respiratory failure with hypoxia: Secondary | ICD-10-CM | POA: Diagnosis not present

## 2024-04-11 DIAGNOSIS — T8610 Unspecified complication of kidney transplant: Secondary | ICD-10-CM | POA: Diagnosis not present

## 2024-04-11 LAB — BASIC METABOLIC PANEL WITH GFR
Anion gap: 14 (ref 5–15)
BUN: 103 mg/dL — ABNORMAL HIGH (ref 8–23)
CO2: 25 mmol/L (ref 22–32)
Calcium: 8.9 mg/dL (ref 8.9–10.3)
Chloride: 100 mmol/L (ref 98–111)
Creatinine, Ser: 6.28 mg/dL — ABNORMAL HIGH (ref 0.61–1.24)
GFR, Estimated: 9 mL/min — ABNORMAL LOW
Glucose, Bld: 109 mg/dL — ABNORMAL HIGH (ref 70–99)
Potassium: 4.7 mmol/L (ref 3.5–5.1)
Sodium: 139 mmol/L (ref 135–145)

## 2024-04-11 LAB — CBC
HCT: 29.5 % — ABNORMAL LOW (ref 39.0–52.0)
Hemoglobin: 8.9 g/dL — ABNORMAL LOW (ref 13.0–17.0)
MCH: 30.6 pg (ref 26.0–34.0)
MCHC: 30.2 g/dL (ref 30.0–36.0)
MCV: 101.4 fL — ABNORMAL HIGH (ref 80.0–100.0)
Platelets: 50 K/uL — ABNORMAL LOW (ref 150–400)
RBC: 2.91 MIL/uL — ABNORMAL LOW (ref 4.22–5.81)
RDW: 16.8 % — ABNORMAL HIGH (ref 11.5–15.5)
WBC: 3.1 K/uL — ABNORMAL LOW (ref 4.0–10.5)
nRBC: 0 % (ref 0.0–0.2)

## 2024-04-11 LAB — PROTIME-INR
INR: 2.8 — ABNORMAL HIGH (ref 0.8–1.2)
Prothrombin Time: 30.9 s — ABNORMAL HIGH (ref 11.4–15.2)

## 2024-04-11 LAB — GLUCOSE, CAPILLARY
Glucose-Capillary: 110 mg/dL — ABNORMAL HIGH (ref 70–99)
Glucose-Capillary: 113 mg/dL — ABNORMAL HIGH (ref 70–99)
Glucose-Capillary: 130 mg/dL — ABNORMAL HIGH (ref 70–99)

## 2024-04-11 LAB — LACTIC ACID, PLASMA: Lactic Acid, Venous: 1.7 mmol/L (ref 0.5–1.9)

## 2024-04-11 LAB — AMMONIA: Ammonia: 26 umol/L (ref 9–35)

## 2024-04-11 LAB — APTT: aPTT: 59 s — ABNORMAL HIGH (ref 24–36)

## 2024-04-11 MED ORDER — HYDROMORPHONE HCL 1 MG/ML IJ SOLN
0.5000 mg | Freq: Once | INTRAMUSCULAR | Status: AC
  Administered 2024-04-11: 0.5 mg via INTRAVENOUS
  Filled 2024-04-11: qty 0.5

## 2024-04-11 MED ORDER — OXIDIZED CELLULOSE EX PADS
1.0000 | MEDICATED_PAD | Freq: Once | CUTANEOUS | Status: AC
  Administered 2024-04-11: 1 via TOPICAL

## 2024-04-11 MED ORDER — HEPARIN SODIUM (PORCINE) 1000 UNIT/ML DIALYSIS
1000.0000 [IU] | INTRAMUSCULAR | Status: DC | PRN
  Administered 2024-04-11: 4600 [IU]
  Filled 2024-04-11 (×7): qty 1

## 2024-04-11 MED ORDER — MYCOPHENOLATE SODIUM 180 MG PO TBEC
180.0000 mg | DELAYED_RELEASE_TABLET | Freq: Every day | ORAL | Status: DC
  Filled 2024-04-11 (×2): qty 1

## 2024-04-11 MED ORDER — MYCOPHENOLATE SODIUM 180 MG PO TBEC
180.0000 mg | DELAYED_RELEASE_TABLET | Freq: Every morning | ORAL | Status: DC
  Filled 2024-04-11 (×2): qty 1

## 2024-04-11 MED ORDER — MIDODRINE HCL 5 MG PO TABS
5.0000 mg | ORAL_TABLET | Freq: Three times a day (TID) | ORAL | Status: DC
  Administered 2024-04-11 (×2): 5 mg via ORAL
  Filled 2024-04-11 (×2): qty 1

## 2024-04-11 MED ORDER — ALBUMIN HUMAN 25 % IV SOLN
25.0000 g | Freq: Once | INTRAVENOUS | Status: AC
  Administered 2024-04-11: 25 g via INTRAVENOUS
  Filled 2024-04-11: qty 100

## 2024-04-11 MED ORDER — HEPARIN SODIUM (PORCINE) 1000 UNIT/ML IJ SOLN
INTRAMUSCULAR | Status: AC
  Filled 2024-04-11: qty 5

## 2024-04-11 MED ORDER — TACROLIMUS 1 MG PO CAPS
1.0000 mg | ORAL_CAPSULE | Freq: Every morning | ORAL | Status: DC
  Filled 2024-04-11 (×2): qty 1

## 2024-04-11 NOTE — Progress Notes (Signed)
 " Progress Note   Patient: [name]. FMW:[id number] DOB: 1961-01-27 DOA: 04/07/2024     3 DOS: the patient was seen and examined on 04/11/2024   Brief hospital course:  [name]. is a 64 y.o. year old male with medical history of HTN, HLD, T2DM, CHF (EF 45%) and 09/2023, chronic atrial fibrillation, BPH, OSA, ESRD status post renal transplant presenting to the ED with worsening shortness of breath.  Patient has been having URI symptoms for about 10 days.  His symptoms include shortness of breath, coughing but patient denies any fevers or chills. States his breathing has worsened over the last 3 days. He states his medications were adjusted a few days ago.  He reports that he is holding onto fluid in his body and just needs lasix .  Denies any chest pain  per family patient had not been eating and drinking well for past few days.   His baseline creatinine is around 2.8-- 3.0 patient came in with creatinine of more than 5.0 workup in the ER showed clinical dehydration along with significant peripheral edema patient is on the transfer list to [facility]    01/18: Took over patient's care this morning.  Has been bleeding from his temporary dialysis access site.  Patient is very restless and agitated.  Wife initially requested transfer to a tertiary facility.  Patient is on the waiting list for [facility].  Further conversation with patient's wife and CODE STATUS was discussed.  She wants him placed on a DO NOT RESUSCITATE status and would like to discuss goals of care with the palliative team     Assessment and Plan:  Acute on chronic congestive heart failure diastolic with EF of 40 to 45% significant edema/anasarca Continue IV Lasix  Continue renal replacement therapy for volume status management Continue holding losartan  and hydralazine  while patient is on midodrine    Acute on chronic kidney disease stage IIIB -- patient is deceased donor  kidney transplant June 2012--  follows with Dr. DUFFY [facility]olt [facility]rology [facility] [place] -- patient is on the transfer list however no beds -- acute kidney injury secondary to dehydration. Patient has had persistent diarrhea and poor PO intake for few days. - Continue to hold losartan  due to AKI as well as hydralazine  due to relative hypotension. -No improvement in his renal function despite hydration -- per nephrology continue outpatient immunosuppressive medications but doses have been decreased, prednisone , mycophenolate , tacrolimus - -- creat 4.9--5.18--5.55--5.96-- 6.28 -- further management per nephrology --1/16--pt to be started on HD --1/17-- malfunctioning existing AV fistula. Vascular surgery consultation with Dr. Laurence who will place temporary dialysis catheter.  Received ultrafiltration overnight --1/18--temporary dialysis catheter placed, bleeding from site of insertion.  Eliquis  on hold.  For renal replacement therapy today most likely ultrafiltration due to blood pressure issues.   A fib appears in sinus rhythm -- Eliquis  is on hold due to bleeding -- not on any rate blocking agent due to history of bradycardia   MDD -- takes Wellbutrin  at home--holding   Type II diabetes with complications of stage IIIb chronic kidney disease -- sliding scale insulin  for now -- Continue consistent carbohydrate diet   Hypertension  Continue to monitor hold home losartan  and hydralazine  due to relative hypotension requiring Midodrine .    GERD: Continue home PPI   Hyperlipidemia: Continue home statin           Subjective: Restless and agitated..  Wife at the bedside  Physical Exam: Vitals:   04/11/24  0220 04/11/24 0416 04/11/24 0929 04/11/24 1252  BP: (!) 104/52 (!) 105/57 (!) 95/54 (!) 109/44  Pulse: 79 70 80 65  Resp:  17 17 16   Temp:  98.6 F (37 C) 98.2 F (36.8 C) 97.7 F (36.5 C)  TempSrc:      SpO2: 99% 100% 100% 100%  Weight:      Height:        General: Restless, agitated, bleeding around site of insertion of his temporary dialysis access  Head:  Pale conjunctiva   Eyes:  Anicteric  Neck:  Supple  Lungs:  Bilateral air entry, Rales at the bases  Heart:  S1S2 no rubs  Abdomen:   Soft, nontender, bowel sounds present  Extremities:  peripheral edema.  Neurologic:  Awake,  restless, agitated  Skin:  No lesions      Data Reviewed: Labs reviewed.  BUN 103, creatinine 6.28 Labs reviewed  Family Communication: Plan of care was discussed with patient's wife and son at the bedside.  Patient's wife is overwhelmed and will like to speak to palliative care with regards to goals of care and further treatment plans.  CODE STATUS was discussed and patient is a DO NOT RESUSCITATE  Disposition: Status is: Inpatient Remains inpatient appropriate because: Continues to require dialysis for AKI  Planned Discharge Destination: TBD    Time spent: 50 minutes  Author: Aimee Somerset, MD 04/11/2024 1:52 PM  For on call review [email or web address].  "

## 2024-04-11 NOTE — Progress Notes (Addendum)
 Received patient in bed to unit.  Alert and oriented x1-2 Informed consent signed and in chart.   TX duration:1.59 minutes  Patient started to pull lines and get out of bed, pt redirected and will get out of bed again after 3 minutes. Dressing is soaked with blood per primary nurse it was already change prior to stating tx. RN had to end tx early for pt safety. Alert, without acute distress. AVF + Bruit, - Thrill  Hand-off given to patient's nurse.   Access used: dialysis cath Access issues: none  Total UF removed: 0 Medication(s) given: heplock 2.2 units arterial 2.4 units venous Post HD VS: see table below Post HD weight: 115.2kg   04/11/24 0202  Vitals  Temp 97.7 F (36.5 C)  Temp Source Oral  BP 119/73  MAP (mmHg) 85  BP Location Left Leg  BP Method Automatic  Patient Position (if appropriate) Lying  Pulse Rate 73  Pulse Rate Source Monitor  ECG Heart Rate 76  Resp 16  Weight 115.2 kg  Type of Weight Post-Dialysis  Oxygen  Therapy  SpO2 100 %  O2 Device HFNC  O2 Flow Rate (L/min) 10 L/min  Patient Activity (if Appropriate) In bed  Pulse Oximetry Type Continuous  During Treatment Monitoring  HD Safety Checks Performed Yes  Intra-Hemodialysis Comments See progress note  Post Treatment  Dialyzer Clearance Lightly streaked  Hemodialysis Intake (mL) 100 mL  Liters Processed 29  Fluid Removed (mL) 0 mL  Tolerated HD Treatment No (Comment)  Post-Hemodialysis Comments see notes  Hemodialysis Catheter Right Subclavian  Placement Date/Time: 04/10/24 1430   Time Out: Correct patient;Correct site;Correct procedure  Maximum sterile barrier precautions: Hand hygiene;Large sterile sheet;Sterile probe cover;Cap;Mask;Sterile gown;Sterile gloves  Site Prep: Chlorhexidine  (pr...  Site Condition No complications  Blue Lumen Status Flushed;Heparin  locked;Dead end cap in place  Red Lumen Status Flushed;Heparin  locked;Dead end cap in place  Purple Lumen Status N/A  Catheter fill  solution Heparin  1000 units/ml  Catheter fill volume (Arterial) 2.2 cc  Catheter fill volume (Venous) 2.4  Dressing Type Gauze/Drain sponge;Transparent  Dressing Status Old drainage  Post treatment catheter status Capped and Clamped      Lorrene KANDICE [facility]

## 2024-04-11 NOTE — Progress Notes (Addendum)
 Hemodialysis Note:  Received patient in bed. Responds to voice Informed consent singed and in chart.  Treatment initiated: 1610 Treatment completed: 1814  Access used: Right Subclavian catheter Access issues: HD catheter dressing is soaked with blood. Dressing changed with transparent and antimicrobial disk.  Patient didn't tolerate his treatment (UF only) due to hypotension. Report given to patient's RN.  Total UF removed: 200 ml Medications given: Albumin  25 gm , Midodrine  10 mg Tablet  Post HD weight: 117 Kg  [name]

## 2024-04-11 NOTE — Progress Notes (Signed)
 "  Cardiology Progress Note   Patient Name: [name]. Date of Encounter: 04/11/2024  Primary Cardiologist: Deatrice Cage, MD  Subjective   Patient sitting up at bedside due to significant orthopnea.  He is groggy with minimal verbal response despite sitting up and being awake.  Family in room.  Patient has been clearing throat throughout the night per family.  Approximate 2-hour dialysis session earlier this morning.  Notes indicate he was disoriented. Objective   Inpatient Medications    Scheduled Meds:  apixaban   5 mg Oral BID   Chlorhexidine  Gluconate Cloth  6 each Topical Q0600   furosemide   80 mg Intravenous BID   insulin  aspart  0-5 Units Subcutaneous QHS   insulin  aspart  0-9 Units Subcutaneous TID WC   mycophenolate   360 mg Oral q morning   And   mycophenolate   180 mg Oral QHS   predniSONE   5 mg Oral Daily   rosuvastatin   10 mg Oral Daily   sodium chloride  flush  3 mL Intravenous Q12H   tacrolimus   1 mg Oral QHS   tacrolimus   2 mg Oral q morning   tamsulosin   0.4 mg Oral Daily   Continuous Infusions:  PRN Meds: acetaminophen  **OR** acetaminophen , oxyCODONE  **AND** acetaminophen , heparin , ondansetron  **OR** [DISCONTINUED] ondansetron  (ZOFRAN ) IV, senna-docusate   Vital Signs    Vitals:   04/11/24 0156 04/11/24 0202 04/11/24 0220 04/11/24 0416  BP: 109/62 119/73 (!) 104/52 (!) 105/57  Pulse:  73 79 70  Resp: 14 16  17   Temp:  97.7 F (36.5 C)  98.6 F (37 C)  TempSrc:  Oral    SpO2: 100% 100% 99% 100%  Weight:  115.2 kg    Height:        Intake/Output Summary (Last 24 hours) at 04/11/2024 0833 Last data filed at 04/11/2024 0400 Gross per 24 hour  Intake 200 ml  Output 200 ml  Net 0 ml   Filed Weights   04/10/24 0828 04/10/24 2343 04/11/24 0202  Weight: 112.8 kg 115.1 kg 115.2 kg    Physical Exam   GEN: Obese, dyspneic. HEENT: Grossly normal.  Neck: Supple, JVD to jaw.  No bruits or masses.   Cardiac: Irregularly irregular, 2/6  systolic murmur heard throughout.  No rubs or gallops.. No clubbing, cyanosis.  3+ bilateral lower extremity edema to the hips with abdominal/flank edema. Respiratory:  Respirations regular and unlabored, coarse breath sounds with crackles throughout. GI: Obese, firm, edematous.   MS: no deformity or atrophy. Skin: warm and dry, no rash. Neuro:  Strength and sensation are intact. Psych: Very groggy this morning.  Opens eyes.  Minimizes responses to one-word.  Labs    Chemistry Recent Labs  Lab 04/07/24 1408 04/07/24 2159 04/08/24 1435 04/08/24 2140 04/09/24 1204  NA 144   < > 142 141 141  K 4.3   < > 4.6 4.7 4.9  CL 104   < > 103 103 103  CO2 25   < > 27 24 26   GLUCOSE 121*   < > 232* 232* 207*  BUN 100*   < > 109* 116* 121*  CREATININE 4.75*   < > 5.55* 5.96* 6.42*  CALCIUM  8.9   < > 9.1 8.6* 8.9  PROT 7.3  --   --   --   --   ALBUMIN  4.1  --   --   --   --   AST 26  --   --   --   --  ALT 16  --   --   --   --   ALKPHOS 203*  --   --   --   --   BILITOT 0.8  --   --   --   --   GFRNONAA 13*   < > 11* 10* 9*  ANIONGAP 14   < > 12 14 13    < > = values in this interval not displayed.     Hematology Recent Labs  Lab 04/07/24 1408 04/08/24 0456 04/11/24 0609  WBC 4.5 2.1* 3.1*  RBC 3.71* 3.58* 2.91*  HGB 11.3* 11.0* 8.9*  HCT 38.1* 36.9* 29.5*  MCV 102.7* 103.1* 101.4*  MCH 30.5 30.7 30.6  MCHC 29.7* 29.8* 30.2  RDW 16.9* 16.8* 16.8*  PLT 82* 72* 50*    Cardiac Enzymes  Recent Labs  Lab 04/07/24 1408 04/07/24 1929  TRNPT 129* 118*    BNP    Component Value Date/Time   BNP 585.6 (H) 02/17/2024 1145   BNP 544.9 (H) 01/26/2024 1024    ProBNP    Component Value Date/Time   PROBNP >35,000.0 (H) 04/07/2024 1408   PROBNP 4,402 (H) 10/21/2022 1214    Lipids  Lab Results  Component Value Date   CHOL 72 (L) 08/21/2023   HDL 37 (L) 08/21/2023   LDLCALC 18 08/21/2023   LDLDIRECT 41.9 04/20/2012   TRIG 83 08/21/2023   CHOLHDL 1.9 08/21/2023     HbA1c  Lab Results  Component Value Date   HGBA1C 5.5 11/06/2023    Radiology    DG Chest Port 1 View Result Date: 04/10/2024 CLINICAL DATA:  End-stage renal disease on dialysis. Post dialysis catheter placement. EXAM: PORTABLE CHEST 1 VIEW COMPARISON:  04/07/2024, 11/22/2023. FINDINGS: The heart is enlarged and the mediastinal contour stable. A calcified nodule is noted in the suprahilar region on the right, compatible with known calcified lymph node. Aortic atherosclerosis is noted. Pulmonary vasculature is distended. Interstitial prominence is noted bilaterally with mild airspace disease at the lung bases. There suspected trace bilateral pleural effusions. No pneumothorax is seen. A right internal jugular central venous catheter terminates in the right atrium. Sternotomy wires are present over the midline. IMPRESSION: 1. Cardiomegaly with pulmonary vascular congestion. 2. Interstitial prominence bilaterally with mild airspace disease at the lung bases, possible edema or infiltrate. 3. Suspected small bilateral pleural effusions. 4. Right internal jugular central venous catheter terminates over the right atrium. Electronically Signed   By: Leita Birmingham M.D.   On: 04/10/2024 15:31   DG C-Arm 1-60 Min-No Report Result Date: 04/10/2024 Fluoroscopy was utilized by the requesting physician.  No radiographic interpretation.   US  RENAL Result Date: 04/07/2024 EXAM: RETROPERITONEAL ULTRASOUND OF THE KIDNEYS 04/07/2024 07:43:14 PM TECHNIQUE: Real-time ultrasonography of the retroperitoneum, specifically the kidneys and urinary bladder, was performed. COMPARISON: None available. CLINICAL HISTORY: AKI (acute kidney injury) FINDINGS: RIGHT KIDNEY: The native right kidney is atrophic and echogenic in keeping with chronic renal insufficiency, and is largely obscured by overlying bowel gas. This is not well assessed on this examination. There is no hydronephrosis involving the visualized portion of the  native right kidney. LEFT KIDNEY: The native left kidney is atrophic and echogenic in keeping with chronic renal insufficiency, and is largely obscured by overlying bowel gas. This is not well assessed on this examination. There is no hydronephrosis involving the visualized portion of the native left kidney. TRANSPLANT KIDNEY: Right lower quadrant transplant kidney is identified. The transplant kidney measures 12.1 x 6.5 x 6.7  cm. Volume: 273.6 cm. No hydronephrosis. No perinephric fluid collection. Normal cortical thickness and echogenicity. No intrarenal masses or calcifications are seen. Normal color flow vascularity. BLADDER: Limited images of the bladder are unremarkable. IMPRESSION: 1. Right lower quadrant transplant kidney without hydronephrosis or perinephric fluid collection. 2. Atrophic and echogenic native kidneys, consistent with chronic renal insufficiency, largely obscured by overlying bowel gas, with no hydronephrosis involving the visualized portions. Electronically signed by: Dorethia Molt MD 04/07/2024 07:49 PM EST RP Workstation: [id number]   DG Chest Portable 1 View Result Date: 04/07/2024 CLINICAL DATA:  Short of breath, cough EXAM: PORTABLE CHEST 1 VIEW COMPARISON:  01/26/2024 FINDINGS: Single frontal view of the chest demonstrates postsurgical changes from CABG. Cardiac silhouette remains enlarged. There is increased pulmonary vascular congestion with patchy bilateral airspace disease greatest at the left lung base. Trace bilateral pleural effusions. No pneumothorax. IMPRESSION: 1. Congestive heart failure, with worsening volume status since prior study. Electronically Signed   By: Ozell Daring M.D.   On: 04/07/2024 14:56     Telemetry    Afib, 80's to 90's PVCs, 6 beats NSVT - Personally Reviewed  Cardiac Studies   2D Echocardiogram 1.15.2026    1. Left ventricular ejection fraction, by estimation, is 40 to 45%. Left  ventricular ejection fraction by PLAX is 48 %. The left  ventricle has  mildly decreased function. The left ventricle demonstrates regional wall  motion abnormalities (septal motion  consistent with bundle branch block).   2. The left ventricular internal cavity size was mildly dilated. There is  mild left ventricular hypertrophy. Left ventricular diastolic parameters  are indeterminate.   3. Right ventricular systolic function is low normal. The right  ventricular size is mildly enlarged. There is moderately elevated  pulmonary artery systolic pressure. The estimated right ventricular  systolic pressure is 47.5 mmHg.   4. Left atrial size was moderately dilated.   5. The mitral valve is normal in structure. Mild to moderate mitral valve  regurgitation. No evidence of mitral stenosis. The mean mitral valve  gradient is 4.0 mmHg. Moderate mitral annular calcification.   6. The aortic valve is normal in structure. Aortic valve regurgitation is  not visualized. No aortic stenosis is present.   7. The inferior vena cava is normal in size with greater than 50%  respiratory variability, suggesting right atrial pressure of 3 mmHg.  _____________   Patient Profile     64 y.o. male with a history of CAD status post CABG in 2005, permanent atrial fibrillation on Eliquis , end-stage renal disease s/p renal transplant 2012, ischemic cardiomyopathy, chronic heart failure with midrange ejection fraction, pulmonary hypertension, hypertension, hyperlipidemia, obesity status post gastric bypass, BPH, gout, and anemia, who was admitted January 14 with respiratory failure, rhinovirus, acute CHF/volume overload, and acute on chronic renal failure.   Assessment & Plan    1. Acute on chronic heart failure with midrange ejection fraction/ischemic cardiomyopathy: History of midrange ejection fraction with echo on January 15 again showing an EF of 40 to 45% with mild LVH, RVSP 47.5, and mild to moderate MR. Patient presented with 7 to 10-day history of upper respiratory  symptoms followed by reduction in urine output and dyspnea. proBNP greater than 35,000 with chest x-ray showing CHF on admission.  He did not respond well to IV Lasix  and required placement of a temporary dialysis catheter on January 17 with dialysis initiated overnight.  Patient is very groggy this morning and orthopneic.  Anasarca on examination.  Follow-up lab work this  morning.  Otherwise request, I reached out to the primary team and nephrology as they would like transfer to a tertiary center.  Volume management per nephrology.  Patient is at high risk for further decompensation.   2.  End stage renal disease status post renal transplant/acute kidney injury: Status post temporary dialysis catheter on January 17 with initiation of dialysis early this morning during which it was documented that patient was pulling lines and trying to get out of bed.  Ongoing anasarca.  See above.   3.  CAD:  Status post prior CABG with nonischemic Myoview  in December 2022. He has not been experiencing chest pain. In setting of volume overload and AKI, troponin is elevated at 129 with a follow-up value of 118. Suspect demand ischemia in the setting of heart failure and AKI. Follow-up limited echo showed stable, midrange LV dysfunction with EF of 40 to 45%. He is not on aspirin  in setting of Eliquis . No beta-blocker in the setting history of bradycardia.  Resume statin.   4.  Permanent atrial fibrillation: Rate controlled in the absence of AV nodal blocking agents due to history of bradycardia.  Chronically anticoagulated with apixaban  however, he has had bleeding around his dialysis catheter site with drop in H&H to 8.9 and 29.5 this morning.  I will hold Eliquis  for the time being.   5.  Primary hypertension: Was hypotensive after midnight.  Pressure 105/57 currently.   6.  Hyperlipidemia: On rosuvastatin .   7.  Microcytic anemia/acute blood loss anemia: Having oozing around temporary dialysis catheter site in the  setting of marked volume overload.  Evaluated by vascular surgery this morning.  H&H down to 8.9 and 29.5.  Holding Eliquis .   8.  UTI: Completed course of ceftriaxone .   9.  Rhinovirus: Supportive care per primary team.    Signed, Lonni Meager, NP  04/11/2024, 8:33 AM    For questions or updates, please contact   Please consult [email or web address] for contact info under Cardiology/STEMI.  "

## 2024-04-11 NOTE — Progress Notes (Signed)
 Soaked dressing noted at new HD catheter site. Dressing reinforced with 4x4 gauze, dressing change twice due to continued saturation. Rockie Rams NP notified with orders Surgicel pad application and laboratory studies. CBC, APTT PROTIME-INR.

## 2024-04-11 NOTE — Progress Notes (Signed)
 " [facility]  PROGRESS NOTE   Subjective:   Events from last night noted.  Patient completed dialysis treatment in the early hours of this morning. Patient is mildly confused.  Patient's wife at bedside.  Had lengthy discussion with her.  Will attempt another dialysis session with fluid removal only today.  Patient had been on Eliquis  until yesterday.  Objective:  Vital signs: Blood pressure (!) 109/44, pulse 65, temperature 97.7 F (36.5 C), resp. rate 16, height 5' 10 (1.778 m), weight 115.2 kg, SpO2 100%.  Intake/Output Summary (Last 24 hours) at 04/11/2024 1434 Last data filed at 04/11/2024 0400 Gross per 24 hour  Intake 100 ml  Output 200 ml  Net -100 ml   Filed Weights   04/10/24 0828 04/10/24 2343 04/11/24 0202  Weight: 112.8 kg 115.1 kg 115.2 kg     Physical Exam: General:  No acute distress  Head:  Normocephalic, atraumatic. Moist oral mucosal membranes  Eyes:  Anicteric  Neck:  Supple  Lungs:  Bilateral wheeze  Heart:  S1S2 no rubs  Abdomen:   Soft, nontender, bowel sounds present  Extremities: 2+ peripheral edema.  Neurologic:  Awake, alert, following commands  Skin:  No lesions  Access:     Basic Metabolic Panel: Recent Labs  Lab 04/07/24 1929 04/07/24 2159 04/08/24 0456 04/08/24 1435 04/08/24 2140 04/09/24 1204 04/11/24 0910  NA  --    < > 142 142 141 141 139  K  --    < > 4.6 4.6 4.7 4.9 4.7  CL  --    < > 102 103 103 103 100  CO2  --    < > 26 27 24 26 25   GLUCOSE  --    < > 171* 232* 232* 207* 109*  BUN  --    < > 106* 109* 116* 121* 103*  CREATININE  --    < > 5.18* 5.55* 5.96* 6.42* 6.28*  CALCIUM   --    < > 9.0 9.1 8.6* 8.9 8.9  MG 2.9*  --   --   --   --   --   --    < > = values in this interval not displayed.   GFR: Estimated Creatinine Clearance: 15.1 mL/min (A) (by C-G formula based on SCr of 6.28 mg/dL (H)).  Liver Function Tests: Recent Labs  Lab 04/07/24 1408  AST 26  ALT 16  ALKPHOS 203*  BILITOT 0.8  PROT  7.3  ALBUMIN  4.1   No results for input(s): LIPASE, AMYLASE in the last 168 hours. Recent Labs  Lab 04/11/24 0910  AMMONIA 26    CBC: Recent Labs  Lab 04/07/24 1408 04/08/24 0456 04/11/24 0609  WBC 4.5 2.1* 3.1*  NEUTROABS 3.6  --   --   HGB 11.3* 11.0* 8.9*  HCT 38.1* 36.9* 29.5*  MCV 102.7* 103.1* 101.4*  PLT 82* 72* 50*     HbA1C: Hemoglobin A1C  Date/Time Value Ref Range Status  10/09/2017 12:00 AM 7.5  Final  06/20/2017 12:00 AM 7.5  Final   Hgb A1c MFr Bld  Date/Time Value Ref Range Status  11/06/2023 10:56 AM 5.5 4.8 - 5.6 % Final    Comment:    (NOTE) Diagnosis of Diabetes The following HbA1c ranges recommended by the American Diabetes Association (ADA) may be used as an aid in the diagnosis of diabetes mellitus.  Hemoglobin             Suggested A1C NGSP%  Diagnosis  <5.7                   Non Diabetic  5.7-6.4                Pre-Diabetic  >6.4                   Diabetic  <7.0                   Glycemic control for                       adults with diabetes.    04/09/2023 03:43 AM 7.7 (H) 4.8 - 5.6 % Final    Comment:    (NOTE) Pre diabetes:          5.7%-6.4%  Diabetes:              >6.4%  Glycemic control for   <7.0% adults with diabetes     Urinalysis: No results for input(s): COLORURINE, LABSPEC, PHURINE, GLUCOSEU, HGBUR, BILIRUBINUR, KETONESUR, PROTEINUR, UROBILINOGEN, NITRITE, LEUKOCYTESUR in the last 72 hours.  Invalid input(s): APPERANCEUR    Imaging: DG Chest Port 1 View Result Date: 04/10/2024 CLINICAL DATA:  End-stage renal disease on dialysis. Post dialysis catheter placement. EXAM: PORTABLE CHEST 1 VIEW COMPARISON:  04/07/2024, 11/22/2023. FINDINGS: The heart is enlarged and the mediastinal contour stable. A calcified nodule is noted in the suprahilar region on the right, compatible with known calcified lymph node. Aortic atherosclerosis is noted. Pulmonary vasculature is distended.  Interstitial prominence is noted bilaterally with mild airspace disease at the lung bases. There suspected trace bilateral pleural effusions. No pneumothorax is seen. A right internal jugular central venous catheter terminates in the right atrium. Sternotomy wires are present over the midline. IMPRESSION: 1. Cardiomegaly with pulmonary vascular congestion. 2. Interstitial prominence bilaterally with mild airspace disease at the lung bases, possible edema or infiltrate. 3. Suspected small bilateral pleural effusions. 4. Right internal jugular central venous catheter terminates over the right atrium. Electronically Signed   By: Leita Birmingham M.D.   On: 04/10/2024 15:31   DG C-Arm 1-60 Min-No Report Result Date: 04/10/2024 Fluoroscopy was utilized by the requesting physician.  No radiographic interpretation.     Medications:     Chlorhexidine  Gluconate Cloth  6 each Topical Q0600   furosemide   80 mg Intravenous BID   insulin  aspart  0-9 Units Subcutaneous TID WC   midodrine   5 mg Oral TID WC   [START ON 04/12/2024] mycophenolate   180 mg Oral q morning   And   mycophenolate   180 mg Oral QHS   predniSONE   5 mg Oral Daily   rosuvastatin   10 mg Oral Daily   sodium chloride  flush  3 mL Intravenous Q12H   tacrolimus   1 mg Oral QHS   [START ON 04/12/2024] tacrolimus   1 mg Oral q morning   tamsulosin   0.4 mg Oral Daily    Assessment/ Plan:     64 y.o. year old male with medical history of HTN, HLD, T2DM, CHF (EF 45%) and 09/2023, chronic atrial fibrillation, BPH, OSA, ESRD status post renal transplant presenting to the ED with worsening shortness of breath. Patient has been having URI symptoms and now in respiratory distress.  He also had acute kidney injury on chronic kidney disease.   #1: Acute kidney injury on chronic kidney disease: Patient had history of renal transplant in 2012.  Now possibly failing.  Patient and family  agreed for dialysis.  AV access did not work.  Called vascular and had a  permacath placed.  He was dialyzed late last night and was unable to remove any fluid.  Spoke to patient's wife at bedside in detail and answered all her questions to her satisfaction.  She wants to talk to her other family members and possibly place the patient on comfort care.  She wants to try 1 more treatment with fluid removal today.  She initially requested us  to transfer the patient to her transplant Institute.   #2: History of renal transplant: Will decrease mycophenolate  to 180 mg twice a day.  Will also decrease the dose of tacrolimus  to 1 mg twice a day.  His most recent tacrolimus  levels were around 10.   #3: Anemia: Will continue to monitor closely.   #4: Respiratory failure/CHF: Patient has anasarca.  Will continue the Lasix  at 80 mg twice a day.     #5: Diabetes: Continue insulin  protocol.  #6: Catheter site bleeding: Was evaluated by vascular this morning.  Patient also has been on Eliquis .  #7: As per the patient's wife she would like to place him on DNR status   Overall prognosis is poor. Labs and medications reviewed. Will continue to follow along with you.   LOS: 3 Lyndsy Gilberto, MD [facility] 1/18/20262:34 PM  "

## 2024-04-11 NOTE — Progress Notes (Signed)
" °  °  °  Daily Progress Note   Assessment/Planning:   POD #1 s/p RIJV TDC  Dressing saturated with serosang fluid Pt is massively fluid overloaded as evident by intra-op ultrasound Pt will continue to drain fluid until fluid balance improved Additionally, platelet dysfunction will potentiate any venous ooze Redress dressing prn Exit site was already tightened with sutures so any further stitches likely to necrose the skin    Subjective  - 1 Day Post-Op   sleeping   Objective   Vitals:   04/11/24 0156 04/11/24 0202 04/11/24 0220 04/11/24 0416  BP: 109/62 119/73 (!) 104/52 (!) 105/57  Pulse:  73 79 70  Resp: 14 16  17   Temp:  97.7 F (36.5 C)  98.6 F (37 C)  TempSrc:  Oral    SpO2: 100% 100% 99% 100%  Weight:  115.2 kg    Height:         Intake/Output Summary (Last 24 hours) at 04/11/2024 0819 Last data filed at 04/11/2024 0400 Gross per 24 hour  Intake 200 ml  Output 200 ml  Net 0 ml    VASC R TDC bandaged with saturated serosang drainage    Laboratory   CBC    Latest Ref Rng & Units 04/11/2024    6:09 AM 04/08/2024    4:56 AM 04/07/2024    2:08 PM  CBC  WBC 4.0 - 10.5 K/uL 3.1  2.1  4.5   Hemoglobin 13.0 - 17.0 g/dL 8.9  88.9  88.6   Hematocrit 39.0 - 52.0 % 29.5  36.9  38.1   Platelets 150 - 400 K/uL 50  72  82     BMET    Component Value Date/Time   NA 141 04/09/2024 1204   NA 143 02/17/2024 1145   NA 141 06/16/2014 0911   K 4.9 04/09/2024 1204   K 3.4 (L) 06/16/2014 0911   CL 103 04/09/2024 1204   CL 104 06/16/2014 0911   CO2 26 04/09/2024 1204   CO2 30 06/16/2014 0911   GLUCOSE 207 (H) 04/09/2024 1204   GLUCOSE 139 (H) 06/16/2014 0911   BUN 121 (H) 04/09/2024 1204   BUN 81 (HH) 02/17/2024 1145   BUN 13 06/16/2014 0911   CREATININE 6.42 (H) 04/09/2024 1204   CREATININE 0.83 06/16/2014 0911   CALCIUM  8.9 04/09/2024 1204   CALCIUM  9.1 06/16/2014 0911   GFRNONAA 9 (L) 04/09/2024 1204   GFRNONAA >60 06/16/2014 0911   GFRAA 52 (L)  11/15/2019 1209   GFRAA >60 06/16/2014 0911     Redell Door, MD, FACS, FSVS Covering for [facility]: [phone]  04/11/2024, 8:19 AM       "

## 2024-04-11 NOTE — Consult Note (Signed)
 "                                   Consultation Note Date: 04/11/2024   Patient Name: [name].  DOB: 04-21-1960  MRN: [id number]  Age / Sex: 64 y.o., male  PCP: [name], MD Referring Physician: Lanetta Lingo, MD  Reason for Consultation: Establishing goals of care   HPI/Brief Hospital Course: 64 y.o. male  with an extensive past medical history of CAD s/p CABG, A-fib, type 2 diabetes, ischemic cardiomyopathy, gout, OSA, renal transplant in 2012, history of bariatric surgery, CHF, hypertension, hyperlipidemia and CKD admitted from home on 04/07/2024 with symptoms of URI x 10 days, diarrhea and poor p.o. intake.  In ED found to have significant AKI with creatinine greater than 5--known baseline around 2.8-3, concern for clinical dehydration with significant peripheral edema In ED decision was made to place on transfer list to [facility] in [place]--no bed available up to this point proBNP greater than 35,000--not responsive to IV Lasix  Need for emergent dialysis--course further complicated by access complications requiring placement of PermCath--received partial HD session early hours this AM--unable to complete full session due to patient safety as [name] was pulling at lines and attempting to get out of bed and unable to be redirected  Palliative medicine was consulted for assisting with goals of care conversations as requested to prioritize by primary team as well as [facility] (house supervisor).  Subjective:  Chart reviewed: Labs:All labs reviewed and trended since admission--renal function continues to decline Vital signs:Hypotension noted Progress Notes:ED notes, primary team, nephrology, vascular, cardiology, and nursing notes reviewed since admission. Care Everywhere also reviewed, outpatient PCP, nephrology and cardiology. Imaging:Imaging reviewed since admission  Visited with [name] at his bedside. He is resting in bed with eyes closed, he is  lethargic, opens eyes intermittently, responds intermittently appropriately, intermittently follows simple commands but easily drifts back to sleep without constant redirection.  Met at bedside with wife-[name].  Introduced myself as a publishing rights manager as a member of the palliative care team. Explained palliative medicine is specialized medical care for people living with serious illness. It focuses on providing relief from the symptoms and stress of a serious illness. The goal is to improve quality of life for both the patient and the family.   [name] shares her concerns and grievances that she and [name] have experience since admission.  [name] shares she feels as though she is not being heard and [name] is not being prioritized.  She is thankful to have someone visit with her to address her concerns.  [name] provides a brief life review.  Her and [name] have been married for over 30 years.  They have 2 sons that both live local to the area but one of their sons is currently visiting [place]  for his birthday with plans to return to [facility] on Tuesday morning.  [name] shares prior to admission [name] was able to care for himself and remained independent.  [name] speaks to [name] complex medical history.  She shares after his CABG he experienced significant renal injury resulting in his need for renal transplant in 2012--after transplant he no longer required hemodialysis.  [name] shares for the last 2 weeks [name] has struggled with a viral illness with upper respiratory symptoms as well as GI upset and lack of intake.  [name] is aware dehydration likely precipitated worsening renal  function.  [name] concerned that [name] receiving hemodialysis is not an more of an urgent matter--nursing staff assisted in reaching out to hemodialysis center, received update that [name] is next in line to receive treatment--will likely be up to get him within the hour.   [name] appreciative of update.  [name] shares she has not heard of any updates regarding transfer to tertiary center.  [name] shares she got in contact with [facility] in [place] and was informed to [name] is next on the list as far as transfer.  [name] shares she is not sure at this time if she will proceed with transfer. [name] verbalizes understanding.  We discussed patient's current illness and what it means in the larger context of patient's on-going co-morbidities. Natural disease trajectory and expectations at EOL were discussed.   [name] shares that she is aware of the significance of [name] overall health complicated by multiple underlying comorbid conditions.  She is aware of his guarded prognosis.  [name] confirms conversation held earlier with primary team regarding CODE STATUS, [name] remains in agreement for DNR/DNI status.  We discussed the difference between continuing with aggressive medical treatments and interventions versus shifting our focus to comfort care. Ms. Reichardt would no longer receive aggressive medical interventions such as continuous vital signs, lab work, radiology testing, or medications not focused on comfort. Explained to [name] this would include discontinuation of HD. All care would focus on how the patient is looking and feeling. This would include management of any symptoms that may cause discomfort, pain, shortness of breath, cough, nausea, agitation, anxiety, and/or secretions etc. Symptoms would be managed with medications and other non-pharmacological interventions such as spiritual support if requested, repositioning, music therapy, or therapeutic listening. Family verbalized understanding and appreciation.    [name] shares at this time, she would like to proceed with HD tonight for [name] in hopes this will improve his overall status and cognition. She would like for [name] to be involved in GOC discussions but aware this may  not be possible. At minimum she would like both of her sons to be apart of GOC discussions--plan set to have both sons available in person or via FaceTime to meet with PMT provider tomorrow to continue GOC discussions.  I discussed importance of continued conversations with family/support persons and all members of their medical team regarding overall plan of care and treatment options ensuring decisions are in alignment with patients goals of care.  All questions/concerns addressed. Emotional support provided to patient/family/support persons. PMT will continue to follow and support patient as needed.   Objective: Primary Diagnoses: Present on Admission:  Hyperlipidemia associated with type 2 diabetes mellitus (HCC)  OSA (obstructive sleep apnea)  Asthma, persistent controlled  Essential hypertension  BPH (benign prostatic hyperplasia)  GERD (gastroesophageal reflux disease)  Obesity, Class III, BMI 40-49.9 (morbid obesity) (HCC)  PAH (pulmonary artery hypertension) (HCC)  Chronic atrial fibrillation (HCC)  Chronic heart failure with mildly reduced ejection fraction (HFmrEF, 41-49%) (HCC)  Acute hypoxic respiratory failure (HCC)  Acute renal failure with acute tubular necrosis superimposed on stage 5 chronic kidney disease, not on chronic dialysis (HCC)  Acute respiratory failure with hypoxia (HCC)   Physical Exam Constitutional:      General: He is not in acute distress.    Appearance: He is obese. He is ill-appearing.  Pulmonary:     Effort: Pulmonary effort is normal. No respiratory distress.  Skin:    General: Skin is warm and dry.  Neurological:     Mental Status: He is easily aroused. He is lethargic.     Motor: Weakness present.     Vital Signs: BP (!) 86/47 (BP Location: Right Leg)   Pulse 66   Temp (!) 97.5 F (36.4 C) (Axillary)   Resp 11   Ht 5' 10 (1.778 m)   Wt 117.2 kg   SpO2 98%   BMI 37.07 kg/m  Pain Scale: 0-10   Pain Score: Asleep  IO:  Intake/output summary:  Intake/Output Summary (Last 24 hours) at 04/11/2024 1737 Last data filed at 04/11/2024 0400 Gross per 24 hour  Intake 100 ml  Output 200 ml  Net -100 ml    LBM: Last BM Date : 04/09/24 Baseline Weight: Weight: 114.6 kg Most recent weight: Weight: 117.2 kg       Assessment and Plan  SUMMARY OF RECOMMENDATIONS   DNR/DNI GOC conversation tomorrow with PMT, wife and two sons  Palliative Prophylaxis:   Bowel Regimen, Delirium Protocol and Frequent Pain Assessment  Discussed With: Primary team and nursing staff   Thank you for this consult and allowing Palliative Medicine to participate in the care of [name]. Palliative medicine will continue to follow and assist as needed.   I personally spent a total of 75 minutes in the care of the patient today including preparing to see the patient, getting/reviewing separately obtained history, performing a medically appropriate exam/evaluation, counseling and educating, referring and communicating with other health care professionals, documenting clinical information in the EHR, and coordinating care.    Signed by: Waddell Lesches, DNP, AGNP-C Palliative Medicine    Please contact Palliative Medicine Team phone at [phone] for questions and concerns.  For individual provider: See Amion   "

## 2024-04-12 DIAGNOSIS — T8610 Unspecified complication of kidney transplant: Secondary | ICD-10-CM | POA: Diagnosis not present

## 2024-04-12 DIAGNOSIS — N170 Acute kidney failure with tubular necrosis: Secondary | ICD-10-CM | POA: Diagnosis not present

## 2024-04-12 DIAGNOSIS — I509 Heart failure, unspecified: Secondary | ICD-10-CM | POA: Diagnosis not present

## 2024-04-12 DIAGNOSIS — N185 Chronic kidney disease, stage 5: Secondary | ICD-10-CM | POA: Diagnosis not present

## 2024-04-12 DIAGNOSIS — Z992 Dependence on renal dialysis: Secondary | ICD-10-CM | POA: Diagnosis not present

## 2024-04-12 DIAGNOSIS — J9601 Acute respiratory failure with hypoxia: Secondary | ICD-10-CM | POA: Diagnosis not present

## 2024-04-12 LAB — CBC
HCT: 28.7 % — ABNORMAL LOW (ref 39.0–52.0)
Hemoglobin: 8.8 g/dL — ABNORMAL LOW (ref 13.0–17.0)
MCH: 31 pg (ref 26.0–34.0)
MCHC: 30.7 g/dL (ref 30.0–36.0)
MCV: 101.1 fL — ABNORMAL HIGH (ref 80.0–100.0)
Platelets: 47 K/uL — ABNORMAL LOW (ref 150–400)
RBC: 2.84 MIL/uL — ABNORMAL LOW (ref 4.22–5.81)
RDW: 16.4 % — ABNORMAL HIGH (ref 11.5–15.5)
WBC: 3.9 K/uL — ABNORMAL LOW (ref 4.0–10.5)
nRBC: 0 % (ref 0.0–0.2)

## 2024-04-12 LAB — GLUCOSE, CAPILLARY
Glucose-Capillary: 133 mg/dL — ABNORMAL HIGH (ref 70–99)
Glucose-Capillary: 107 mg/dL — ABNORMAL HIGH (ref 70–99)
Glucose-Capillary: 112 mg/dL — ABNORMAL HIGH (ref 70–99)
Glucose-Capillary: 98 mg/dL (ref 70–99)
Glucose-Capillary: 101 mg/dL — ABNORMAL HIGH (ref 70–99)

## 2024-04-12 LAB — BASIC METABOLIC PANEL WITH GFR
Anion gap: 14 (ref 5–15)
BUN: 115 mg/dL — ABNORMAL HIGH (ref 8–23)
CO2: 25 mmol/L (ref 22–32)
Calcium: 9.1 mg/dL (ref 8.9–10.3)
Chloride: 100 mmol/L (ref 98–111)
Creatinine, Ser: 7.19 mg/dL — ABNORMAL HIGH (ref 0.61–1.24)
GFR, Estimated: 8 mL/min — ABNORMAL LOW
Glucose, Bld: 106 mg/dL — ABNORMAL HIGH (ref 70–99)
Potassium: 5.2 mmol/L — ABNORMAL HIGH (ref 3.5–5.1)
Sodium: 139 mmol/L (ref 135–145)

## 2024-04-12 MED ORDER — LORAZEPAM 2 MG/ML IJ SOLN
1.0000 mg | INTRAMUSCULAR | Status: DC | PRN
  Administered 2024-04-12: 1 mg via INTRAVENOUS
  Filled 2024-04-12: qty 1

## 2024-04-12 MED ORDER — GLYCOPYRROLATE 0.2 MG/ML IJ SOLN
0.1000 mg | Freq: Once | INTRAMUSCULAR | Status: AC
  Administered 2024-04-12: 0.1 mg via INTRAVENOUS
  Filled 2024-04-12: qty 0.5

## 2024-04-12 MED ORDER — HYDROMORPHONE HCL 1 MG/ML IJ SOLN
0.5000 mg | Freq: Four times a day (QID) | INTRAMUSCULAR | Status: DC | PRN
  Administered 2024-04-12: 0.5 mg via INTRAVENOUS
  Filled 2024-04-12: qty 0.5

## 2024-04-12 MED ORDER — ONDANSETRON 4 MG PO TBDP: 4.0000 mg | ORAL_TABLET | Freq: Four times a day (QID) | ORAL | Status: DC | PRN

## 2024-04-12 MED ORDER — LORAZEPAM 2 MG/ML PO CONC: 1.0000 mg | ORAL | Status: DC | PRN

## 2024-04-12 MED ORDER — HYDROMORPHONE HCL 1 MG/ML IJ SOLN
0.5000 mg | INTRAMUSCULAR | Status: AC | PRN
  Administered 2024-04-12: 0.5 mg via INTRAVENOUS
  Filled 2024-04-12: qty 0.5

## 2024-04-12 MED ORDER — HALOPERIDOL LACTATE 5 MG/ML IJ SOLN: 0.5000 mg | INTRAMUSCULAR | Status: DC | PRN

## 2024-04-12 MED ORDER — LORAZEPAM 2 MG/ML IJ SOLN
0.5000 mg | Freq: Once | INTRAMUSCULAR | Status: AC
  Administered 2024-04-12: 0.5 mg via INTRAVENOUS
  Filled 2024-04-12: qty 1

## 2024-04-12 MED ORDER — HYDROMORPHONE HCL 1 MG/ML IJ SOLN
0.5000 mg | INTRAMUSCULAR | Status: DC | PRN
  Administered 2024-04-12 (×2): 0.5 mg via INTRAVENOUS
  Filled 2024-04-12 (×2): qty 0.5

## 2024-04-12 MED ORDER — HALOPERIDOL 0.5 MG PO TABS: 0.5000 mg | ORAL_TABLET | ORAL | Status: DC | PRN

## 2024-04-12 MED ORDER — ALBUMIN HUMAN 25 % IV SOLN: 25.0000 g | Freq: Once | INTRAVENOUS | Status: DC

## 2024-04-12 MED ORDER — HEPARIN SODIUM (PORCINE) 1000 UNIT/ML DIALYSIS: 1000.0000 [IU] | INTRAMUSCULAR | Status: DC | PRN

## 2024-04-12 MED ORDER — ONDANSETRON HCL 4 MG/2ML IJ SOLN: 4.0000 mg | Freq: Four times a day (QID) | INTRAMUSCULAR | Status: DC | PRN

## 2024-04-12 MED ORDER — HALOPERIDOL LACTATE 2 MG/ML PO CONC: 0.5000 mg | ORAL | Status: DC | PRN

## 2024-04-12 MED ORDER — ALTEPLASE 2 MG IJ SOLR: 2.0000 mg | Freq: Once | INTRAMUSCULAR | Status: DC | PRN

## 2024-04-12 MED ORDER — OXYCODONE HCL 5 MG PO TABS: 10.0000 mg | ORAL_TABLET | Freq: Four times a day (QID) | ORAL | Status: DC | PRN

## 2024-04-12 MED ORDER — LORAZEPAM 1 MG PO TABS: 1.0000 mg | ORAL_TABLET | ORAL | Status: DC | PRN

## 2024-04-12 MED ORDER — ACETAMINOPHEN 325 MG PO TABS: 325.0000 mg | ORAL_TABLET | Freq: Four times a day (QID) | ORAL | Status: DC | PRN

## 2024-04-12 NOTE — Progress Notes (Signed)
 " [facility]  PROGRESS NOTE   Subjective:   Patient seen resting in bed, eyes closed.  Wife at bedside, tearful. Wife voices concerns of upcoming goals of care discussion Request 1 additional dialysis treatment to hopefully improve mentation so that patient can participate.  Objective:  Vital signs: Blood pressure (!) 101/36, pulse 74, temperature (!) 96.4 F (35.8 C), resp. rate 20, height 5' 10 (1.778 m), weight 117 kg, SpO2 95%.  Intake/Output Summary (Last 24 hours) at 04/12/2024 1608 Last data filed at 04/12/2024 1018 Gross per 24 hour  Intake 0 ml  Output 200 ml  Net -200 ml   Filed Weights   04/11/24 1600 04/11/24 1814 04/12/24 0500  Weight: 117.2 kg 117 kg 117 kg     Physical Exam: General:  No acute distress  Head:  Normocephalic, atraumatic. Moist oral mucosal membranes  Eyes:  Anicteric  Lungs:  Bilateral wheeze  Heart:  S1S2 no rubs  Abdomen:   Soft, nontender, bowel sounds present  Extremities: 3+ peripheral edema.  Neurologic:  Awake, alert, following commands  Skin:  No lesions  Access: Left aVF, right chest PermCath    Basic Metabolic Panel: Recent Labs  Lab 04/07/24 1929 04/07/24 2159 04/08/24 1435 04/08/24 2140 04/09/24 1204 04/11/24 0910 04/12/24 0920  NA  --    < > 142 141 141 139 139  K  --    < > 4.6 4.7 4.9 4.7 5.2*  CL  --    < > 103 103 103 100 100  CO2  --    < > 27 24 26 25 25   GLUCOSE  --    < > 232* 232* 207* 109* 106*  BUN  --    < > 109* 116* 121* 103* 115*  CREATININE  --    < > 5.55* 5.96* 6.42* 6.28* 7.19*  CALCIUM   --    < > 9.1 8.6* 8.9 8.9 9.1  MG 2.9*  --   --   --   --   --   --    < > = values in this interval not displayed.   GFR: Estimated Creatinine Clearance: 13.3 mL/min (A) (by C-G formula based on SCr of 7.19 mg/dL (H)).  Liver Function Tests: Recent Labs  Lab 04/07/24 1408  AST 26  ALT 16  ALKPHOS 203*  BILITOT 0.8  PROT 7.3  ALBUMIN  4.1   No results for input(s): LIPASE, AMYLASE  in the last 168 hours. Recent Labs  Lab 04/11/24 0910  AMMONIA 26    CBC: Recent Labs  Lab 04/07/24 1408 04/08/24 0456 04/11/24 0609 04/12/24 0920  WBC 4.5 2.1* 3.1* 3.9*  NEUTROABS 3.6  --   --   --   HGB 11.3* 11.0* 8.9* 8.8*  HCT 38.1* 36.9* 29.5* 28.7*  MCV 102.7* 103.1* 101.4* 101.1*  PLT 82* 72* 50* 47*     HbA1C: Hemoglobin A1C  Date/Time Value Ref Range Status  10/09/2017 12:00 AM 7.5  Final  06/20/2017 12:00 AM 7.5  Final   Hgb A1c MFr Bld  Date/Time Value Ref Range Status  11/06/2023 10:56 AM 5.5 4.8 - 5.6 % Final    Comment:    (NOTE) Diagnosis of Diabetes The following HbA1c ranges recommended by the American Diabetes Association (ADA) may be used as an aid in the diagnosis of diabetes mellitus.  Hemoglobin             Suggested A1C NGSP%  Diagnosis  <5.7                   Non Diabetic  5.7-6.4                Pre-Diabetic  >6.4                   Diabetic  <7.0                   Glycemic control for                       adults with diabetes.    04/09/2023 03:43 AM 7.7 (H) 4.8 - 5.6 % Final    Comment:    (NOTE) Pre diabetes:          5.7%-6.4%  Diabetes:              >6.4%  Glycemic control for   <7.0% adults with diabetes     Urinalysis: No results for input(s): COLORURINE, LABSPEC, PHURINE, GLUCOSEU, HGBUR, BILIRUBINUR, KETONESUR, PROTEINUR, UROBILINOGEN, NITRITE, LEUKOCYTESUR in the last 72 hours.  Invalid input(s): APPERANCEUR    Imaging: No results found.    Medications:    albumin  human      Chlorhexidine  Gluconate Cloth  6 each Topical Q0600   furosemide   80 mg Intravenous BID   insulin  aspart  0-9 Units Subcutaneous TID WC   midodrine   5 mg Oral TID WC   mycophenolate   180 mg Oral q morning   And   mycophenolate   180 mg Oral QHS   predniSONE   5 mg Oral Daily   rosuvastatin   10 mg Oral Daily   sodium chloride  flush  3 mL Intravenous Q12H   tacrolimus   1 mg Oral QHS    tacrolimus   1 mg Oral q morning   tamsulosin   0.4 mg Oral Daily    Assessment/ Plan:     64 y.o. year old male with medical history of HTN, HLD, T2DM, CHF (EF 45%) and 09/2023, chronic atrial fibrillation, BPH, OSA, ESRD status post renal transplant presenting to the ED with worsening shortness of breath. Patient has been having URI symptoms and now in respiratory distress.  He also had acute kidney injury on chronic kidney disease.   #1: Acute kidney injury on chronic kidney disease: Patient had history of renal transplant in 2012.  Now possibly failing.  Patient and family agreed for dialysis.    Patient received partial treatment on dialysis until access infiltration.  Unable to access fistula during Saturday's treatment.  Right PermCath placed with dialysis afterwards.  Patient unable to tolerate fluid pool due to hypotension.  Discussed with spouse the concern for additional dialysis today, patient's intolerance of fluid pool.  Spouse request for treatment for fluid removal and improvements in mentation.  Short treatment ordered with albumin  for blood pressure support.   #2: History of renal transplant: Will decrease mycophenolate  to 180 mg twice a day.  Will also decrease the dose of tacrolimus  to 1 mg twice a day.  His most recent tacrolimus  levels were around 10.   #3: Anemia with chronic kidney disease: Hemoglobin 8.8, can consider ESA.  Will continue to monitor due to bleeding access site.  Oozing likely secondary to elevated BUN levels.   #4: Respiratory failure/CHF: Patient has anasarca.  Will continue the Lasix  at 80 mg twice a day.     #5: Diabetes melitis type II with chronic kidney disease: Glucose well-controlled.  LOS: 4 [facility] 1/19/20264:08 PM  "

## 2024-04-12 NOTE — Progress Notes (Signed)
 Upon arrival offered prayer per family request. Family was appreciative of prayer. Family engaged in storytelling. Wife feels well supported by family and friends.     04/12/24 2300  Spiritual Encounters  Type of Visit Follow up  Care provided to: Pt and family  Reason for visit Religious ritual  OnCall Visit Yes

## 2024-04-12 NOTE — Progress Notes (Addendum)
 " Progress Note   Patient: [name]. FMW:[id number] DOB: 1960/08/09 DOA: 04/07/2024     4 DOS: the patient was seen and examined on 04/12/2024   Brief hospital course:  [name]. is a 64 y.o. year old male with medical history of HTN, HLD, T2DM, CHF (EF 45%) and 09/2023, chronic atrial fibrillation, BPH, OSA, ESRD status post renal transplant presenting to the ED with worsening shortness of breath.  Patient has been having URI symptoms for about 10 days.  His symptoms include shortness of breath, coughing but patient denies any fevers or chills. States his breathing has worsened over the last 3 days. He states his medications were adjusted a few days ago.  He reports that he is holding onto fluid in his body and just needs lasix .  Denies any chest pain  per family patient had not been eating and drinking well for past few days.   His baseline creatinine is around 2.8-- 3.0 patient came in with creatinine of more than 5.0 workup in the ER showed clinical dehydration along with significant peripheral edema patient is on the transfer list to [facility]     01/18: Took over patient's care this morning.  Has been bleeding from his temporary dialysis access site.  Patient is very restless and agitated.  Wife initially requested transfer to a tertiary facility.  Patient is on the waiting list for [facility].  Further conversation with patient's wife and CODE STATUS was discussed.  She wants him placed on a DO NOT RESUSCITATE status and would like to discuss goals of care with the palliative team.   01/19: Seen and examined at the bedside.  Remains lethargic and unable to follow commands.  Wife is at the bedside.  Requests for medications for pain control for patient  Assessment and Plan:   Acute on chronic congestive heart failure diastolic with EF of 40 to 45% significant edema/anasarca Continue IV Lasix  as much as blood pressure tolerates.    Patient has had relative hypotension which has limited his renal replacement therapy Continue renal replacement therapy for volume status management as much as blood pressure tolerates Continue holding losartan  and hydralazine  while patient is on midodrine     Acute on chronic kidney disease stage IIIB -- patient is deceased donor kidney transplant June 2012--  follows [facility]. DUFFY [facility]olt [facility] [place] -- patient is on the transfer list however no beds -- acute kidney injury secondary to dehydration. Patient has had persistent diarrhea and poor PO intake for few days. - Continue to hold losartan  due to AKI as well as hydralazine  due to relative hypotension. -No improvement in his renal function despite hydration -- per nephrology continue outpatient immunosuppressive medications but doses have been decreased, prednisone , mycophenolate , tacrolimus - -- creat continues to show an upward trend 4.9--5.18--5.55--5.96-- 6.28--7.18  -- further management per nephrology --1/16--pt to be started on HD --1/17-- malfunctioning existing AV fistula. Vascular surgery consultation with Dr. Laurence who will place temporary dialysis catheter.  Received ultrafiltration overnight --1/18--temporary dialysis catheter placed, bleeding from site of insertion.  Eliquis  on hold.  For renal replacement therapy today most likely ultrafiltration due to blood pressure issues. --1/19--received renal replacement therapy bowel overnight and required albumin  and midodrine  due to hypotension.  Remains lethargic.  No changes in his mental status following treatment overnight.  Mild hyperkalemia noted which should improve with dialysis   Acute metabolic encephalopathy Secondary to patient's worsening renal function Remains lethargic and confused with  episodes of agitation Appreciate palliative care consult    A fib appears in sinus rhythm -- Eliquis  remains on hold due to bleeding from site of insertion  of his temporary dialysis catheter.? -- not on any rate blocking agent due to history of bradycardia   MDD -- takes Wellbutrin  at home--holding due to altered mental status completed   Type II diabetes with complications of stage IIIb chronic kidney disease -- Patient is n.p.o. for now due to mental status changes --Blood sugar checks every 4 hours   Hypertension  Continue to hold home losartan  and hydralazine  due to relative hypotension requiring Midodrine .     GERD: Continue home PPI   Hyperlipidemia: Continue home statin            Subjective: Remains lethargic with episodes of confusion.  Wife at the bedside  Physical Exam: Vitals:   04/12/24 0105 04/12/24 0500 04/12/24 0511 04/12/24 0857  BP: 137/66  (!) 102/45 (!) 101/36  Pulse: 74  73 74  Resp: 18  20 20   Temp: (!) 96 F (35.6 C)  97.8 F (36.6 C) (!) 96.4 F (35.8 C)  TempSrc:   Oral   SpO2: 97%  98% 95%  Weight:  117 kg    Height:       General: Lethargic with episodes of agitation, bleeding around site of insertion of his temporary dialysis access  Head:  Pale conjunctiva   Eyes:  Anicteric  Neck:  Supple  Lungs:  Bilateral air entry, Rales at the bases  Heart:  S1S2 no rubs  Abdomen:   Soft, nontender, bowel sounds present  Extremities:  peripheral edema.  Neurologic: Lethargic  Skin:  No lesions    Data Reviewed: Labs reviewed. Potassium 5.2, BUN 115, creatinine 7.19, hemoglobin 8.8 Labs reviewed.  Family Communication: Plan of care was discussed with patient's wife at the bedside.  She notes that she is waiting for her son's and patient's sister to arrive to discuss goals of care with palliative care.  Disposition: Status is: Inpatient Remains inpatient appropriate because: Continues to require dialysis  Planned Discharge Destination: TBD    Time spent: 50 minutes  Author: Aimee Somerset, MD 04/12/2024 2:27 PM  For on call review [email or web address].  "

## 2024-04-12 NOTE — IPAL (Signed)
" °  Interdisciplinary Goals of Care Family Meeting   Date carried out: 04/12/2024  Location of the meeting: Bedside  Member's involved: Physician, Bedside Registered Nurse, and Family Member or next of kin  Durable Power of Attorney or acting medical decision maker: Patient's wife, [name]  Discussion: We discussed goals of care for [name]. patient with worsening renal function as well as worsening mental status and unable to tolerate hemodialysis due to hypotension.  Wife is agreeable to place on comfort measures.  Code status:   Code Status: Do not attempt resuscitation (DNR) - Comfort care   Disposition: In-patient comfort care  Time spent for the meeting: 35 minutes    Carl Falotico, MD  04/12/2024, 6:03 PM   "

## 2024-04-12 NOTE — Progress Notes (Signed)
 Changed perm cath dressing using surgicell and gauze and tegaderm held pressure for 10 minutes. Also went to take off pressure dressing from fistula in left arm from Friday 04/09/2024 and the site started bleeding. Used another surgicell and gauze and held pressure to that site also. Pressure applied to fistula site.

## 2024-04-12 NOTE — Progress Notes (Signed)
 SPIRITUAL CARE AND COUNSELING CONSULT NOTE   VISIT SUMMARY  Chaplain provided spiritual care to family as directed by on duty nurse. Will continue to moniter as extended family arrives.  SPIRITUAL ENCOUNTER                                                                                                                                                                      Type of Visit: Initial Care provided to:: Family Referral source: Nurse (RN/NT/LPN) Reason for visit: Urgent spiritual support OnCall Visit: No   SPIRITUAL FRAMEWORK  Presenting Themes: Significant life change, Caregiving needs, Coping tools, Community and relationships Community/Connection: Family, Friend(s), Significant other, Faith community Patient Stress Factors: None identified Family Stress Factors: Financial concerns, Health changes, Major life changes   GOALS       INTERVENTIONS   Spiritual Care Interventions Made: Established relationship of care and support, Normalization of emotions, Bereavement/grief support, Encouragement    INTERVENTION OUTCOMES   Outcomes: Awareness of health, Reduced isolation  SPIRITUAL CARE PLAN   Spiritual Care Issues Still Outstanding: Chaplain will continue to follow    If immediate needs arise,    Lailah Marcelli  04/12/2024 2:27 PM

## 2024-04-12 NOTE — Progress Notes (Signed)
 "                                                                                                                                                                                               Palliative Care Progress Note, Assessment & Plan   Patient Name: [name].       Date: 04/12/2024 DOB: 23-Oct-1960  Age: 64 y.o. MRN#: [id number] Attending Physician: Lanetta Lingo, MD Primary Care Physician: [name], MD Admit Date: 04/07/2024  Subjective: Unable to assess  HPI: Per previous HPI- 64 y.o. male  with an extensive past medical history of CAD s/p CABG, A-fib, type 2 diabetes, ischemic cardiomyopathy, gout, OSA, renal transplant in 2012, history of bariatric surgery, CHF, hypertension, hyperlipidemia and CKD admitted from home on 04/07/2024 with symptoms of URI x 10 days, diarrhea and poor p.o. intake.   In ED found to have significant AKI with creatinine greater than 5--known baseline around 2.8-3, concern for clinical dehydration with significant peripheral edema In ED decision was made to place on transfer list to [facility] in [place]--no bed available up to this point proBNP greater than 35,000--not responsive to IV Lasix  Need for emergent dialysis--course further complicated by access complications requiring placement of PermCath--received partial HD session early hours this AM--unable to complete full session due to patient safety as [name] was pulling at lines and attempting to get out of bed and unable to be redirected   Palliative medicine was consulted for assisting with goals of care conversations as requested to prioritize by primary team as well as [facility] (house supervisor).\\  Following up today per previous PMT provider request for family meeting to discuss prognosis and possible transition to comfort care.  Summary of counseling/coordination of care Chart reviewed:   Labs: Hyperkalemic 5.2.  BUN increased to 115, creatinine 7.19 and GFR 8.   Hemoglobin stable at 8.8 platelets 47 today from 50 yesterday.  PT/INR 30.9/2.8.     Latest Ref Rng & Units 04/12/2024    9:20 AM 04/11/2024    6:09 AM 04/08/2024    4:56 AM  CBC  WBC 4.0 - 10.5 K/uL 3.9  3.1  2.1   Hemoglobin 13.0 - 17.0 g/dL 8.8  8.9  88.9   Hematocrit 39.0 - 52.0 % 28.7  29.5  36.9   Platelets 150 - 400 K/uL 47  50  72       Latest Ref Rng & Units 04/12/2024    9:20 AM 04/11/2024    9:10 AM 04/09/2024   12:04 PM  CMP  Glucose 70 - 99 mg/dL 893  890  207   BUN 8 - 23 mg/dL 884  896  878   Creatinine 0.61 - 1.24 mg/dL 2.80  3.71  3.57   Sodium 135 - 145 mmol/L 139  139  141   Potassium 3.5 - 5.1 mmol/L 5.2  4.7  4.9   Chloride 98 - 111 mmol/L 100  100  103   CO2 22 - 32 mmol/L 25  25  26    Calcium  8.9 - 10.3 mg/dL 9.1  8.9  8.9      Vitals: Blood pressure (!) 101/36, pulse 74, temperature (!) 96.4 F (35.8 C), resp. rate 20, height 5' 10 (1.778 m), weight 117 kg, SpO2 95%.   Progress notes: Reviewed notes from [facility], PMT, nephrology, cardiology, vascular surgery and nursing notes.  Imaging: No new imaging  MAR: No changes to [facility]  ACP documents: No ACP documentation on file  After reviewing the patient's chart and assessing the patient at bedside, I spoke with patient's wife in regards to symptom management and goals of care.   Ill-appearing male sleeping in bed with wife at bedside.  He has episodes of agitation and yells out intermittently.  Accessory muscle use noted without tachypnea.  Bair hugger in place as patient was hypothermic this morning at 96.4 F.  He does not respond to verbal stimuli.  He is in no distress.  Introduced myself as publishing rights manager from palliative medicine team.  [name], wife, shares she is not ready for family meeting today as she is awaiting the arrival of patient's sister and a time for both sons to be involved as well.  She shares Tuesday, 04-14-2024, would be a better day for them.    [name] tearfully states she is not ready  to let [name] (husband) go but she also does not want to see him suffer.  We discussed worsening labs and inability to tolerate dialysis as well as other medical comorbidities makes prognosis even more poor.  [name] verbalizes understanding and again is tearful at the thought of letting [name] go.  She shares change today with not being able to arouse her husband or get him to respond.  She states she does not want to unplug him until family has had a chance to visit.    Discussed in the event she felt [name] was suffering, she could decide transition to comfort where medications would be provided to reduce suffering.  [name] shares concern when her husband is gone what she will do and how she will survive financially.   [name] continues to wish for [name] to be able to have dialysis with possibility of waking up and speaking to her to share his wishes.  Therapeutic silence and active listening provided for [name] to share her thoughts and emotions regarding current medical situation.  Emotional support provided.  She declines spiritual support at this time stating chaplain has already visited this morning.  Physical Exam Vitals reviewed.  Constitutional:      General: He is not in acute distress.    Appearance: He is ill-appearing.  HENT:     Head: Normocephalic and atraumatic.     Mouth/Throat:     Mouth: Mucous membranes are dry.  Pulmonary:     Effort: No respiratory distress.     Comments: Accessory muscle use Musculoskeletal:     Right lower leg: No edema.     Left lower leg: No edema.  Skin:    General: Skin is warm and dry.   Recommendations/Plan: DNR/DNI Continue current supportive  interventions Wife request family meeting 2024-05-10 when sons and patient's sister can be present Palliative will follow for continuing GOC discussions           I personally spent a total of 50 minutes in the care of the patient today including preparing to see the patient, getting/reviewing separately  obtained history, performing a medically appropriate exam/evaluation, counseling and educating, referring and communicating with other health care professionals, documenting clinical information in the EHR, independently interpreting results, communicating results, and coordinating care.    Devere Sacks, AMANDA [facility] Palliative Medicine Team  04/12/2024 8:43 AM  Office [phone]  Pager [phone]     "

## 2024-04-12 NOTE — Progress Notes (Addendum)
 PT Cancellation Note  Patient Details Name: [name]. MRN: [id number] DOB: May 03, 1960   Cancelled Treatment:    Reason Eval/Treat Not Completed: Fatigue/lethargy limiting ability to participate  Pt lethargic today and not able to actively participate.  Initial recommendations for CIR but after discussion with team and family, CIR does not seem like the appropriate disposition given medical changes.  Will adjust recommendations to < 3 hrs a day at this time to assist with DC planning.   Lauraine Gills 04/12/2024, 3:49 PM

## 2024-04-12 NOTE — Plan of Care (Signed)
°  Problem: Education: Goal: Ability to describe self-care measures that may prevent or decrease complications (Diabetes Survival Skills Education) will improve Outcome: Not Progressing   Problem: Education: Goal: Individualized Educational Video(s) Outcome: Not Progressing   Problem: Coping: Goal: Ability to adjust to condition or change in health will improve Outcome: Not Progressing   Problem: Fluid Volume: Goal: Ability to maintain a balanced intake and output will improve Outcome: Not Progressing

## 2024-04-12 NOTE — Progress Notes (Signed)
 "  Rounding Note   Patient Name: [name]. Date of Encounter: 04/12/2024  [facility] Cardiologist: Deatrice Cage, MD   Subjective Patient somnolent on exam. Wife at the bedside. Did not tolerate dialysis yesterday due to hypotension.   Scheduled Meds:  Chlorhexidine  Gluconate Cloth  6 each Topical Q0600   furosemide   80 mg Intravenous BID   insulin  aspart  0-9 Units Subcutaneous TID WC   midodrine   5 mg Oral TID WC   mycophenolate   180 mg Oral q morning   And   mycophenolate   180 mg Oral QHS   predniSONE   5 mg Oral Daily   rosuvastatin   10 mg Oral Daily   sodium chloride  flush  3 mL Intravenous Q12H   tacrolimus   1 mg Oral QHS   tacrolimus   1 mg Oral q morning   tamsulosin   0.4 mg Oral Daily   Continuous Infusions:  albumin  human     PRN Meds: acetaminophen  **OR** acetaminophen , oxyCODONE  **AND** acetaminophen , HYDROmorphone  (DILAUDID ) injection, ondansetron  **OR** [DISCONTINUED] ondansetron  (ZOFRAN ) IV, senna-docusate   Vital Signs  Vitals:   04/12/24 0105 04/12/24 0500 04/12/24 0511 04/12/24 0857  BP: 137/66  (!) 102/45 (!) 101/36  Pulse: 74  73 74  Resp: 18  20 20   Temp: (!) 96 F (35.6 C)  97.8 F (36.6 C) (!) 96.4 F (35.8 C)  TempSrc:   Oral   SpO2: 97%  98% 95%  Weight:  117 kg    Height:        Intake/Output Summary (Last 24 hours) at 04/12/2024 1005 Last data filed at 04/12/2024 0600 Gross per 24 hour  Intake 0 ml  Output 200 ml  Net -200 ml      04/12/2024    5:00 AM 04/11/2024    6:14 PM 04/11/2024    4:00 PM  Last 3 Weights  Weight (lbs) 257 lb 15 oz 257 lb 15 oz 258 lb 6.1 oz  Weight (kg) 117 kg 117 kg 117.2 kg      Telemetry Atrial fibrillation, rate 60s to 80s bpm- Personally Reviewed  Physical Exam  GEN: No acute distress.   Neck: + JVD Cardiac: IRIR, II/VI systolic murmur, no rubs or gallops.  Respiratory: Coarse breath sounds heart anteriorly GI: Abdomen firm and edematous MS: No edema; No  deformity. Neuro:  Nonfocal  Psych: Somnolent  Labs High Sensitivity Troponin:  No results for input(s): TROPONINIHS in the last 720 hours.  Recent Labs  Lab 04/07/24 1408 04/07/24 1929  TRNPT 129* 118*       Chemistry Recent Labs  Lab 04/07/24 1408 04/07/24 1929 04/07/24 2159 04/08/24 2140 04/09/24 1204 04/11/24 0910  NA 144  --    < > 141 141 139  K 4.3  --    < > 4.7 4.9 4.7  CL 104  --    < > 103 103 100  CO2 25  --    < > 24 26 25   GLUCOSE 121*  --    < > 232* 207* 109*  BUN 100*  --    < > 116* 121* 103*  CREATININE 4.75*  --    < > 5.96* 6.42* 6.28*  CALCIUM  8.9  --    < > 8.6* 8.9 8.9  MG  --  2.9*  --   --   --   --   PROT 7.3  --   --   --   --   --   ALBUMIN   4.1  --   --   --   --   --   AST 26  --   --   --   --   --   ALT 16  --   --   --   --   --   ALKPHOS 203*  --   --   --   --   --   BILITOT 0.8  --   --   --   --   --   GFRNONAA 13*  --    < > 10* 9* 9*  ANIONGAP 14  --    < > 14 13 14    < > = values in this interval not displayed.    Lipids No results for input(s): CHOL, TRIG, HDL, LABVLDL, LDLCALC, CHOLHDL in the last 168 hours.  Hematology Recent Labs  Lab 04/08/24 0456 04/11/24 0609 04/12/24 0920  WBC 2.1* 3.1* 3.9*  RBC 3.58* 2.91* 2.84*  HGB 11.0* 8.9* 8.8*  HCT 36.9* 29.5* 28.7*  MCV 103.1* 101.4* 101.1*  MCH 30.7 30.6 31.0  MCHC 29.8* 30.2 30.7  RDW 16.8* 16.8* 16.4*  PLT 72* 50* 47*   Thyroid  No results for input(s): TSH, FREET4 in the last 168 hours.  BNP Recent Labs  Lab 04/07/24 1408  PROBNP >35,000.0*    DDimer No results for input(s): DDIMER in the last 168 hours.   Radiology  DG Chest Port 1 View Result Date: 04/10/2024 IMPRESSION: 1. Cardiomegaly with pulmonary vascular congestion. 2. Interstitial prominence bilaterally with mild airspace disease at the lung bases, possible edema or infiltrate. 3. Suspected small bilateral pleural effusions. 4. Right internal jugular central venous catheter  terminates over the right atrium. Electronically Signed   By: Leita Birmingham M.D.   On: 04/10/2024 15:31   DG C-Arm 1-60 Min-No Report Result Date: 04/10/2024 Fluoroscopy was utilized by the requesting physician.  No radiographic interpretation.    Cardiac Studies  04/08/2024 2D echo 1. Left ventricular ejection fraction, by estimation, is 40 to 45%. Left  ventricular ejection fraction by PLAX is 48 %. The left ventricle has  mildly decreased function. The left ventricle demonstrates regional wall  motion abnormalities (septal motion  consistent with bundle branch block).   2. The left ventricular internal cavity size was mildly dilated. There is  mild left ventricular hypertrophy. Left ventricular diastolic parameters  are indeterminate.   3. Right ventricular systolic function is low normal. The right  ventricular size is mildly enlarged. There is moderately elevated  pulmonary artery systolic pressure. The estimated right ventricular  systolic pressure is 47.5 mmHg.   4. Left atrial size was moderately dilated.   5. The mitral valve is normal in structure. Mild to moderate mitral valve  regurgitation. No evidence of mitral stenosis. The mean mitral valve  gradient is 4.0 mmHg. Moderate mitral annular calcification.   6. The aortic valve is normal in structure. Aortic valve regurgitation is  not visualized. No aortic stenosis is present.   7. The inferior vena cava is normal in size with greater than 50%  respiratory variability, suggesting right atrial pressure of 3 mmHg.   Patient Profile   64 y.o. male with a history of CAD status post CABG in 2005, permanent atrial fibrillation on Eliquis , end-stage renal disease s/p renal transplant 2012, ischemic cardiomyopathy, chronic heart failure with midrange ejection fraction, pulmonary hypertension, hypertension, hyperlipidemia, obesity status post gastric bypass, BPH, gout, and anemia, who was admitted January 14 with respiratory failure,  rhinovirus, acute CHF/volume overload, and acute on chronic renal failure.   Assessment & Plan   Acute on chronic HFmrEF Ischemic cardiomyopathy - History of of heart failure with midrange ejection fraction.  Echo this admission with EF 40 to 45%, mild LVH, RVSP 47.5, mild to moderate MR - Patient initially presented with 7 to 10-day history of upper respiratory symptoms followed by reduction in urine output and dyspnea - proBNP greater than 35,000 with chest x-ray showing CHF on admission - Did not respond well to IV Lasix  and required placement of temporary dialysis catheter in 1/17 with dialysis initiated overnight. Did not tolerate dialysis yesterday evening due to hypotension.  - Remains volume overloaded on exam with anasarca - Volume management per nephrology  End-stage renal disease s/p renal transplant AKI - S/p temporary dialysis catheter placed 1/17 with initiation of dialysis during which it was documented that patient was pulling lines and trying to get out of bed - Ongoing management per nephrology  Coronary artery disease - S/p CABG with nonischemic Myoview  in 02/2021 - Denies chest pain - Troponin mildly elevated at 129 > 118, in the setting of volume overload and AKI.  Suspect demand ischemia. - Echo this admission showed stable midrange LV dysfunction, EF 40 to 45% - Continue statin therapy.  No aspirin  in the setting of Eliquis .  No beta-blocker with history of bradycardia.  Permanent atrial fibrillation - Rate controlled in the absence of AV nodal blocking agents due to history of bradycardia - Chronically anticoagulated with Eliquis .  However, has had bleeding around his dialysis catheter site with drop in hemoglobin/hematocrit.  Eliquis  currently held.  Primary hypertension - BP low but overall stable  Hyperlipidemia - Continue statin therapy  Microcytic anemia Acute blood loss anemia - Oozing around the temporary dialysis catheter noted - Hemoglobin and  hematocrit overall stable this morning  UTI - Completed course of antibiotics  Rhinovirus -Ongoing management per IM   For questions or updates, please contact [facility] Please consult [email or web address] for contact info under       Signed, Lesley LITTIE Maffucci, PA-C  04/12/2024, 10:05 AM    "

## 2024-04-12 NOTE — Progress Notes (Addendum)
 OT Cancellation Note  Patient Details Name: [name]. MRN: [id number] DOB: 06/23/1960   Cancelled Treatment:    Reason Eval/Treat Not Completed: Medical issues which prohibited therapy;Fatigue/lethargy limiting ability to participate. Pt received in bed with family present. Pt lethargic and unable to actively participate in OT. OT will follow acutely and see as appropriate at later date.   Cherise Fedder L. Khyan Oats, OTR/L  04/12/24, 4:27 PM

## 2024-04-12 NOTE — Significant Event (Addendum)
 Notified by nephrology that they are unable to dialyze patient due to ongoing hypotension.  Patient has had 2 dialysis sessions and they have been unable to remove any fluid due to hypotension.  He continues to have worsening anasarca and fluid overload and remains lethargic and poorly arousable. Discussed again in detail with patient's wife and recommendations made for comfort measures which she is agreeable to.  She notes that she does not want patient to suffer and wants to make sure he is pain-free.  She notes that patient's sister is on her way and will get here at about 8 PM.  Her sons are also trying to make it to the hospital. Will place orders for comfort measures Offered chaplain support which patient's wife declines at this time    Total time spent was 35 minutes

## 2024-04-13 ENCOUNTER — Ambulatory Visit: Admitting: Student in an Organized Health Care Education/Training Program

## 2024-04-13 ENCOUNTER — Inpatient Hospital Stay

## 2024-04-13 ENCOUNTER — Ambulatory Visit: Admitting: Student

## 2024-04-13 LAB — GLUCOSE, CAPILLARY: Glucose-Capillary: 97 mg/dL (ref 70–99)

## 2024-04-14 ENCOUNTER — Telehealth: Payer: Self-pay | Admitting: Family Medicine

## 2024-04-14 NOTE — Telephone Encounter (Addendum)
 Called wife Berwyn to express my condolences on husband's passing yesterday  She was appreciative of phone call .

## 2024-04-25 NOTE — Death Summary Note (Signed)
 "  DEATH SUMMARY   Patient Details  Name: [name]. MRN: [id number] DOB: 02/05/1961 ERE:[name], [name], MD Admission/Discharge Information   Admit Date:  05-04-24  Date of Death: Date of Death: 2024-05-10  Time of Death: Time of Death: 0401  Length of Stay: 5   Principle Cause of death: Acute kidney injury                                             Acute metabolic encephalopathy                                            Acute on chronic systolic dysfunction CHF  Hospital Diagnoses: Active Problems:   Acute renal failure with acute tubular necrosis superimposed on stage 5 chronic kidney disease, not on chronic dialysis (HCC)   Acute respiratory failure with hypoxia (HCC)   Acute metabolic encephalopathy   PAH (pulmonary artery hypertension) (HCC)   Essential hypertension   OSA (obstructive sleep apnea)   Asthma, persistent controlled   BPH (benign prostatic hyperplasia)   Type II diabetes mellitus (HCC)   Acute on chronic systolic CHF (congestive heart failure) (HCC)   GERD (gastroesophageal reflux disease)   Obesity, Class III, BMI 40-49.9 (morbid obesity) (HCC)   Renal transplant disorder   HFrEF (heart failure with reduced ejection fraction) (HCC)   S/P CABG (coronary artery bypass graft)   Permanent atrial fibrillation (HCC)   Acute decompensated heart failure [facility])   Hospital Course: [name]. is a 64 y.o. year old male with medical history of HTN, HLD, T2DM, CHF (EF 45%) and 09/2023, chronic atrial fibrillation, BPH, OSA, ESRD status post renal transplant presenting to the ED with worsening shortness of breath.  Patient has been having URI symptoms for about 10 days.  His symptoms include shortness of breath, coughing but patient denies any fevers or chills. States his breathing has worsened over the last 3 days. He states his medications were adjusted a few days ago.  He reports that he is holding onto fluid in his body and just needs  lasix .  Denies any chest pain  per family patient had not been eating and drinking well for past few days.   His baseline creatinine is around 2.8-- 3.0 patient came in with creatinine of more than 5.0 workup in the ER showed clinical dehydration along with significant peripheral edema patient is on the transfer list to [facility] in [facility]     01/18: Took over patient's care this morning.  Has been bleeding from his temporary dialysis access site.  Patient is very restless and agitated.  Wife initially requested transfer to a tertiary facility.  Patient is on the waiting list for [facility].  Further conversation with patient's wife and CODE STATUS was discussed.  She wants him placed on a DO NOT RESUSCITATE status and would like to discuss goals of care with the palliative team.     01/19: Seen and examined at the bedside.  Remains lethargic and unable to follow commands.  Wife is at the bedside.  Requests for medications for pain control for patient. Patient continued to decline and had a conversation with patient's wife since he was unable to get dialysis due to worsening hypotension.  She understood  that his overall prognosis remains poor and agreed to place patient on comfort measures.  04-May-2024: Patient expired at 4:01 AM    Assessment and Plan:  Acute on chronic congestive heart failure diastolic with EF of 40 to 45% significant edema/anasarca Patient with significant fluid overload related to AKI and unable to receive Lasix  due to hypotension and minimal response to renal replacement therapy since they were unable to pull fluids due to hypotension with minimal response to albumin  and midodrine       Acute on chronic kidney disease stage IIIB -- patient is deceased donor kidney transplant June 2012--  follows [name] [place] -- patient was on the transfer list however no beds -- acute kidney injury was thought to be secondary  to dehydration from GI losses due to diarrhea as well as poor PO intake for few days. - Patient's losartan  was held due to AKI and hydralazine  held due to relative hypotension. -No improvement in his renal function despite hydration -- per nephrology continue outpatient immunosuppressive medications but doses have been decreased, prednisone , mycophenolate , tacrolimus - -- Creatinine continued to show an upward trend 4.9--5.18--5.55--5.96-- 6.28--7.18  -- further management per nephrology --1/16--pt to be started on HD --1/17-- malfunctioning existing AV fistula. Vascular surgery consultation with Dr. Laurence who will place temporary dialysis catheter.  Received ultrafiltration overnight --1/18--temporary dialysis catheter placed, bleeding from site of insertion.  Eliquis  on hold.  For renal replacement therapy today most likely ultrafiltration due to blood pressure issues. --1/19--received renal replacement therapy bowel overnight and required albumin  and midodrine  due to hypotension.  Remains lethargic.  No changes in his mental status following treatment overnight.  Mild hyperkalemia noted which should improve with dialysis.  Patient unable to receive renal replacement therapy due to persistent hypotension.  Patient was placed on comfort measures with     Acute metabolic encephalopathy Secondary to patient's worsening renal function/uremia Remains lethargic and confused with episodes of agitation Appreciate palliative care consult     A fib appears in sinus rhythm -- Eliquis  remains on hold due to bleeding from site of insertion of his temporary dialysis catheter.? -- not on any rate blocking agent due to history of bradycardia   MDD -- takes Wellbutrin  at home--holding due to altered mental status completed   Type II diabetes with complications of stage IIIb chronic kidney disease -- Patient is n.p.o. for now due to mental status changes --Blood sugar checks every 4 hours   Hypertension   Continue to hold home losartan  and hydralazine  due to relative hypotension requiring Midodrine .     GERD: Continue home PPI   Hyperlipidemia: Continue home statin           Procedures: Temporary dialysis access insertion  Consultations: Nephrology, cardiology, palliative care  The results of significant diagnostics from this hospitalization (including imaging, microbiology, ancillary and laboratory) are listed below for reference.   Significant Diagnostic Studies: DG Chest Port 1 View Result Date: 04/10/2024 CLINICAL DATA:  End-stage renal disease on dialysis. Post dialysis catheter placement. EXAM: PORTABLE CHEST 1 VIEW COMPARISON:  04/07/2024, 11/22/2023. FINDINGS: The heart is enlarged and the mediastinal contour stable. A calcified nodule is noted in the suprahilar region on the right, compatible with known calcified lymph node. Aortic atherosclerosis is noted. Pulmonary vasculature is distended. Interstitial prominence is noted bilaterally with mild airspace disease at the lung bases. There suspected trace bilateral pleural effusions. No pneumothorax is seen. A right internal jugular central venous catheter terminates in the  right atrium. Sternotomy wires are present over the midline. IMPRESSION: 1. Cardiomegaly with pulmonary vascular congestion. 2. Interstitial prominence bilaterally with mild airspace disease at the lung bases, possible edema or infiltrate. 3. Suspected small bilateral pleural effusions. 4. Right internal jugular central venous catheter terminates over the right atrium. Electronically Signed   By: Leita Birmingham M.D.   On: 04/10/2024 15:31   DG C-Arm 1-60 Min-No Report Result Date: 04/10/2024 Fluoroscopy was utilized by the requesting physician.  No radiographic interpretation.   ECHOCARDIOGRAM COMPLETE Result Date: 04/08/2024    ECHOCARDIOGRAM REPORT   Patient Name:   [name]. Date of Exam: 04/08/2024 Medical Rec #:  [id number]               Height:        70.0 in Accession #:    [id number]              Weight:       252.6 lb Date of Birth:  [birth year]               BSA:          2.305 m Patient Age:    63 years                BP:           125/65 mmHg Patient Gender: M                       HR:           67 bpm. Exam Location:  [facility] Procedure: 2D Echo, Cardiac Doppler and Color Doppler (Both Spectral and Color            Flow Doppler were utilized during procedure). Indications:     CHF-acute systolic I50.21  History:         Patient has prior history of Echocardiogram examinations, most                  recent 09/27/2023. CAD, Arrythmias:Atrial Fibrillation; Risk                  Factors:Hypertension. Aortic stenosis.  Sonographer:     Christopher Furnace Referring Phys:  [id number] [facility] Diagnosing Phys: Evalene Lunger MD IMPRESSIONS  1. Left ventricular ejection fraction, by estimation, is 40 to 45%. Left ventricular ejection fraction by PLAX is 48 %. The left ventricle has mildly decreased function. The left ventricle demonstrates regional wall motion abnormalities (septal motion consistent with bundle branch block).  2. The left ventricular internal cavity size was mildly dilated. There is mild left ventricular hypertrophy. Left ventricular diastolic parameters are indeterminate.  3. Right ventricular systolic function is low normal. The right ventricular size is mildly enlarged. There is moderately elevated pulmonary artery systolic pressure. The estimated right ventricular systolic pressure is 47.5 mmHg.  4. Left atrial size was moderately dilated.  5. The mitral valve is normal in structure. Mild to moderate mitral valve regurgitation. No evidence of mitral stenosis. The mean mitral valve gradient is 4.0 mmHg. Moderate mitral annular calcification.  6. The aortic valve is normal in structure. Aortic valve regurgitation is not visualized. No aortic stenosis is present.  7. The inferior vena cava is normal in size with greater than 50% respiratory variability,  suggesting right atrial pressure of 3 mmHg. FINDINGS  Left Ventricle: Left ventricular ejection fraction, by estimation, is 40 to 45%. Left ventricular ejection fraction by PLAX is 48 %.  The left ventricle has mildly decreased function. The left ventricle demonstrates regional wall motion abnormalities. Strain was performed and the global longitudinal strain is indeterminate. The left ventricular internal cavity size was mildly dilated. There is mild left ventricular hypertrophy. Left ventricular diastolic parameters are indeterminate. Right Ventricle: The right ventricular size is mildly enlarged. No increase in right ventricular wall thickness. Right ventricular systolic function is low normal. There is moderately elevated pulmonary artery systolic pressure. The tricuspid regurgitant  velocity is 3.26 m/s, and with an assumed right atrial pressure of 5 mmHg, the estimated right ventricular systolic pressure is 47.5 mmHg. Left Atrium: Left atrial size was moderately dilated. Right Atrium: Right atrial size was normal in size. Pericardium: There is no evidence of pericardial effusion. Mitral Valve: The mitral valve is normal in structure. Moderate mitral annular calcification. Mild to moderate mitral valve regurgitation. No evidence of mitral valve stenosis. MV peak gradient, 11.0 mmHg. The mean mitral valve gradient is 4.0 mmHg. Tricuspid Valve: The tricuspid valve is normal in structure. Tricuspid valve regurgitation is not demonstrated. No evidence of tricuspid stenosis. Aortic Valve: The aortic valve is normal in structure. Aortic valve regurgitation is not visualized. No aortic stenosis is present. Aortic valve mean gradient measures 12.7 mmHg. Aortic valve peak gradient measures 20.9 mmHg. Aortic valve area, by VTI measures 1.53 cm. Pulmonic Valve: The pulmonic valve was normal in structure. Pulmonic valve regurgitation is not visualized. No evidence of pulmonic stenosis. Aorta: The aortic root is normal in  size and structure. Venous: The inferior vena cava is normal in size with greater than 50% respiratory variability, suggesting right atrial pressure of 3 mmHg. IAS/Shunts: No atrial level shunt detected by color flow Doppler. Additional Comments: 3D was performed not requiring image post processing on an independent workstation and was indeterminate.  LEFT VENTRICLE PLAX 2D LV EF:         Left            Diastology                ventricular     LV e' medial:   5.22 cm/s                ejection        LV E/e' medial: 30.3                fraction by                PLAX is 48                %. LVIDd:         6.10 cm LVIDs:         4.60 cm LV PW:         1.40 cm LV IVS:        1.40 cm LVOT diam:     2.00 cm LV SV:         70 LV SV Index:   30 LVOT Area:     3.14 cm  LV Volumes (MOD) LV vol d, MOD    180.0 ml A2C: LV vol d, MOD    86.1 ml A4C: LV vol s, MOD    161.0 ml A2C: LV vol s, MOD    66.8 ml A4C: LV SV MOD A2C:   19.0 ml LV SV MOD A4C:   86.1 ml LV SV MOD BP:    23.1 ml RIGHT VENTRICLE RV Basal diam:  4.10 cm RV Mid diam:  4.30 cm LEFT ATRIUM             Index        RIGHT ATRIUM           Index LA diam:        5.80 cm 2.52 cm/m   RA Area:     18.00 cm LA Vol (A2C):   84.6 ml 36.70 ml/m  RA Volume:   41.10 ml  17.83 ml/m LA Vol (A4C):   82.9 ml 35.97 ml/m LA Biplane Vol: 88.7 ml 38.48 ml/m  AORTIC VALVE AV Area (Vmax):    1.42 cm AV Area (Vmean):   1.45 cm AV Area (VTI):     1.53 cm AV Vmax:           228.67 cm/s AV Vmean:          164.667 cm/s AV VTI:            0.459 m AV Peak Grad:      20.9 mmHg AV Mean Grad:      12.7 mmHg LVOT Vmax:         103.00 cm/s LVOT Vmean:        76.200 cm/s LVOT VTI:          0.223 m LVOT/AV VTI ratio: 0.49  AORTA Ao Root diam: 3.00 cm MITRAL VALVE                TRICUSPID VALVE MV Area (PHT): 3.21 cm     TR Peak grad:   42.5 mmHg MV Area VTI:   2.20 cm     TR Vmax:        326.00 cm/s MV Peak grad:  11.0 mmHg MV Mean grad:  4.0 mmHg     SHUNTS MV Vmax:       1.66 m/s      Systemic VTI:  0.22 m MV Vmean:      87.6 cm/s    Systemic Diam: 2.00 cm MV Decel Time: 236 msec MV E velocity: 158.00 cm/s Evalene Lunger MD Electronically signed by Evalene Lunger MD Signature Date/Time: 04/08/2024/1:23:21 PM    Final    US  RENAL Result Date: 04/07/2024 EXAM: RETROPERITONEAL ULTRASOUND OF THE KIDNEYS 04/07/2024 07:43:14 PM TECHNIQUE: Real-time ultrasonography of the retroperitoneum, specifically the kidneys and urinary bladder, was performed. COMPARISON: None available. CLINICAL HISTORY: AKI (acute kidney injury) FINDINGS: RIGHT KIDNEY: The native right kidney is atrophic and echogenic in keeping with chronic renal insufficiency, and is largely obscured by overlying bowel gas. This is not well assessed on this examination. There is no hydronephrosis involving the visualized portion of the native right kidney. LEFT KIDNEY: The native left kidney is atrophic and echogenic in keeping with chronic renal insufficiency, and is largely obscured by overlying bowel gas. This is not well assessed on this examination. There is no hydronephrosis involving the visualized portion of the native left kidney. TRANSPLANT KIDNEY: Right lower quadrant transplant kidney is identified. The transplant kidney measures 12.1 x 6.5 x 6.7 cm. Volume: 273.6 cm. No hydronephrosis. No perinephric fluid collection. Normal cortical thickness and echogenicity. No intrarenal masses or calcifications are seen. Normal color flow vascularity. BLADDER: Limited images of the bladder are unremarkable. IMPRESSION: 1. Right lower quadrant transplant kidney without hydronephrosis or perinephric fluid collection. 2. Atrophic and echogenic native kidneys, consistent with chronic renal insufficiency, largely obscured by overlying bowel gas, with no hydronephrosis involving the visualized portions. Electronically signed by: Dorethia Molt MD 04/07/2024 07:49 PM EST RP Workstation:  [id number]   DG Chest Portable 1 View Result Date:  04/07/2024 CLINICAL DATA:  Short of breath, cough EXAM: PORTABLE CHEST 1 VIEW COMPARISON:  01/26/2024 FINDINGS: Single frontal view of the chest demonstrates postsurgical changes from CABG. Cardiac silhouette remains enlarged. There is increased pulmonary vascular congestion with patchy bilateral airspace disease greatest at the left lung base. Trace bilateral pleural effusions. No pneumothorax. IMPRESSION: 1. Congestive heart failure, with worsening volume status since prior study. Electronically Signed   By: Ozell Daring M.D.   On: 04/07/2024 14:56    Microbiology: Recent Results (from the past 240 hours)  Resp panel by RT-PCR (RSV, Flu A&B, Covid) Anterior Nasal Swab     Status: None   Collection Time: 04/07/24  2:08 PM   Specimen: Anterior Nasal Swab  Result Value Ref Range Status   SARS Coronavirus 2 by RT PCR NEGATIVE NEGATIVE Final    Comment: (NOTE) SARS-CoV-2 target nucleic acids are NOT DETECTED.  The SARS-CoV-2 RNA is generally detectable in upper respiratory specimens during the acute phase of infection. The lowest concentration of SARS-CoV-2 viral copies this assay can detect is 138 copies/mL. A negative result does not preclude SARS-Cov-2 infection and should not be used as the sole basis for treatment or other patient management decisions. A negative result may occur with  improper specimen collection/handling, submission of specimen other than nasopharyngeal swab, presence of viral mutation(s) within the areas targeted by this assay, and inadequate number of viral copies(<138 copies/mL). A negative result must be combined with clinical observations, patient history, and epidemiological information. The expected result is Negative.  Fact Sheet for Patients:  bloggercourse.com  Fact Sheet for Healthcare Providers:  seriousbroker.it  This test is no t yet approved or cleared by the [place]  FDA and  has been  authorized for detection and/or diagnosis of SARS-CoV-2 by FDA under an Emergency Use Authorization (EUA). This EUA will remain  in effect (meaning this test can be used) for the duration of the COVID-19 declaration under Section 564(b)(1) of the Act, 21 U.S.C.section 360bbb-3(b)(1), unless the authorization is terminated  or revoked sooner.       Influenza A by PCR NEGATIVE NEGATIVE Final   Influenza B by PCR NEGATIVE NEGATIVE Final    Comment: (NOTE) The Xpert Xpress SARS-CoV-2/FLU/RSV plus assay is intended as an aid in the diagnosis of influenza from Nasopharyngeal swab specimens and should not be used as a sole basis for treatment. Nasal washings and aspirates are unacceptable for Xpert Xpress SARS-CoV-2/FLU/RSV testing.  Fact Sheet for Patients: bloggercourse.com  Fact Sheet for Healthcare Providers: seriousbroker.it  This test is not yet approved or cleared by the [place]  FDA and has been authorized for detection and/or diagnosis of SARS-CoV-2 by FDA under an Emergency Use Authorization (EUA). This EUA will remain in effect (meaning this test can be used) for the duration of the COVID-19 declaration under Section 564(b)(1) of the Act, 21 U.S.C. section 360bbb-3(b)(1), unless the authorization is terminated or revoked.     Resp Syncytial Virus by PCR NEGATIVE NEGATIVE Final    Comment: (NOTE) Fact Sheet for Patients: bloggercourse.com  Fact Sheet for Healthcare Providers: seriousbroker.it  This test is not yet approved or cleared by the [place]  FDA and has been authorized for detection and/or diagnosis of SARS-CoV-2 by FDA under an Emergency Use Authorization (EUA). This EUA will remain in effect (meaning this test can be used) for the duration of the COVID-19 declaration under Section 564(b)(1) of the Act, 21 U.S.C. section  360bbb-3(b)(1), unless the  authorization is terminated or revoked.  Performed at [facility], [address] [place], [place] [address]   Respiratory (~20 pathogens) panel by PCR     Status: Abnormal   Collection Time: 04/07/24  7:29 PM   Specimen: Nasopharyngeal Swab; Respiratory  Result Value Ref Range Status   Adenovirus NOT DETECTED NOT DETECTED Final   Coronavirus 229E NOT DETECTED NOT DETECTED Final    Comment: (NOTE) The Coronavirus on the Respiratory Panel, DOES NOT test for the novel  Coronavirus (2019 nCoV)    Coronavirus HKU1 NOT DETECTED NOT DETECTED Final   Coronavirus NL63 NOT DETECTED NOT DETECTED Final   Coronavirus OC43 NOT DETECTED NOT DETECTED Final   Metapneumovirus NOT DETECTED NOT DETECTED Final   Rhinovirus / Enterovirus DETECTED (A) NOT DETECTED Final   Influenza A NOT DETECTED NOT DETECTED Final   Influenza B NOT DETECTED NOT DETECTED Final   Parainfluenza Virus 1 NOT DETECTED NOT DETECTED Final   Parainfluenza Virus 2 NOT DETECTED NOT DETECTED Final   Parainfluenza Virus 3 NOT DETECTED NOT DETECTED Final   Parainfluenza Virus 4 NOT DETECTED NOT DETECTED Final   Respiratory Syncytial Virus NOT DETECTED NOT DETECTED Final   Bordetella pertussis NOT DETECTED NOT DETECTED Final   Bordetella Parapertussis NOT DETECTED NOT DETECTED Final   Chlamydophila pneumoniae NOT DETECTED NOT DETECTED Final   Mycoplasma pneumoniae NOT DETECTED NOT DETECTED Final    Comment: Performed at [facility], [address] [place], [place] [address]  Urine Culture (for pregnant, neutropenic or urologic patients or patients with an indwelling urinary catheter)     Status: None   Collection Time: 04/07/24  8:46 PM   Specimen: Urine, Clean Catch  Result Value Ref Range Status   Specimen Description   Final    URINE, CLEAN CATCH Performed at [facility], [address] [place], [place] [address]    Special Requests   Final    NONE Performed at [facility], [address] [place], [place] [address]    Culture   Final    NO GROWTH Performed at [facility], [address], [place], [place] [address]    Report Status 04/09/2024 FINAL  Final    Time spent: 40 minutes  Signed: Aimee Somerset, MD 2024-04-24   "

## 2024-04-25 NOTE — Plan of Care (Signed)
  Problem: Education: Goal: Ability to describe self-care measures that may prevent or decrease complications (Diabetes Survival Skills Education) will improve Outcome: Progressing Goal: Individualized Educational Video(s) Outcome: Progressing   Problem: Coping: Goal: Ability to adjust to condition or change in health will improve Outcome: Progressing   Problem: Fluid Volume: Goal: Ability to maintain a balanced intake and output will improve Outcome: Progressing   Problem: Health Behavior/Discharge Planning: Goal: Ability to identify and utilize available resources and services will improve Outcome: Progressing Goal: Ability to manage health-related needs will improve Outcome: Progressing   Problem: Metabolic: Goal: Ability to maintain appropriate glucose levels will improve Outcome: Progressing   Problem: Nutritional: Goal: Maintenance of adequate nutrition will improve Outcome: Progressing Goal: Progress toward achieving an optimal weight will improve Outcome: Progressing   Problem: Skin Integrity: Goal: Risk for impaired skin integrity will decrease Outcome: Progressing   Problem: Tissue Perfusion: Goal: Adequacy of tissue perfusion will improve Outcome: Progressing   Problem: Education: Goal: Knowledge of General Education information will improve Description: Including pain rating scale, medication(s)/side effects and non-pharmacologic comfort measures Outcome: Progressing   Problem: Health Behavior/Discharge Planning: Goal: Ability to manage health-related needs will improve Outcome: Progressing   Problem: Clinical Measurements: Goal: Ability to maintain clinical measurements within normal limits will improve Outcome: Progressing Goal: Will remain free from infection Outcome: Progressing Goal: Diagnostic test results will improve Outcome: Progressing Goal: Respiratory complications will improve Outcome: Progressing Goal: Cardiovascular complication will  be avoided Outcome: Progressing   Problem: Activity: Goal: Risk for activity intolerance will decrease Outcome: Progressing   Problem: Nutrition: Goal: Adequate nutrition will be maintained Outcome: Progressing   Problem: Coping: Goal: Level of anxiety will decrease Outcome: Progressing   Problem: Elimination: Goal: Will not experience complications related to bowel motility Outcome: Progressing Goal: Will not experience complications related to urinary retention Outcome: Progressing   Problem: Pain Managment: Goal: General experience of comfort will improve and/or be controlled Outcome: Progressing   Problem: Safety: Goal: Ability to remain free from injury will improve Outcome: Progressing   Problem: Skin Integrity: Goal: Risk for impaired skin integrity will decrease Outcome: Progressing   Problem: Education: Goal: Knowledge of the prescribed therapeutic regimen will improve Outcome: Progressing   Problem: Coping: Goal: Ability to identify and develop effective coping behavior will improve Outcome: Progressing   Problem: Clinical Measurements: Goal: Quality of life will improve Outcome: Progressing   Problem: Respiratory: Goal: Verbalizations of increased ease of respirations will increase Outcome: Progressing   Problem: Role Relationship: Goal: Family's ability to cope with current situation will improve Outcome: Progressing Goal: Ability to verbalize concerns, feelings, and thoughts to partner or family member will improve Outcome: Progressing   Problem: Pain Management: Goal: Satisfaction with pain management regimen will improve Outcome: Progressing

## 2024-04-25 NOTE — Significant Event (Signed)
"  ° °      CROSS COVER NOTE  NAME: [name]. MRN: [id number] DOB : Jul 27, 1960 ATTENDING PHYSICIAN: Lanetta Lingo, MD    Notified by RN that patient has expired at 0401  Patient was DNR/DNI and comfort care  2 RN verified and family notified by RN.    This document was prepared using Dragon voice recognition software and may include unintentional dictation errors.  Rockie Rams DNP, MBA, FNP-BC Nurse Practitioner [facility] Pager [phone]  "

## 2024-04-25 NOTE — Progress Notes (Signed)
 PT Cancellation Note  Patient Details Name: [name]. MRN: [id number] DOB: 1961/02/20   Cancelled Treatment:    Reason Eval/Treat Not Completed: Medical issues which prohibited therapy  Due to medical decline and comfort status, pt no longer appropriate for therapy interventions.  Will DC at this time.   Lauraine Gills 04-25-24, 8:02 AM

## 2024-04-25 NOTE — Progress Notes (Unsigned)
" °  Cardiology Office Note   Date:  04/17/2024  ID:  Arrick Dutton., DOB Feb 22, 1961, MRN [id number] PCP: [name], MD  [facility] Providers Cardiologist:  Deatrice Cage, MD Electrophysiologist:  OLE ONEIDA HOLTS, MD (Inactive)  Advanced Heart Failure:  Ezra Shuck, MD { Click to update primary MD,subspecialty MD or APP then REFRESH:1}    History of Present Illness Urie Bret Vanessen. is a 64 y.o. male ***  ROS: ***  Studies Reviewed      *** Risk Assessment/Calculations {Does this patient have ATRIAL FIBRILLATION?[phone]} No BP recorded.  {Refresh Note OR Click here to enter BP  :1}***       Physical Exam VS:  There were no vitals taken for this visit.       Wt Readings from Last 3 Encounters:  04-22-24 259 lb 4.2 oz (117.6 kg)  02/17/24 238 lb 8 oz (108.2 kg)  02/09/24 245 lb 8 oz (111.4 kg)    GEN: Well nourished, well developed in no acute distress NECK: No JVD; No carotid bruits CARDIAC: ***RRR, no murmurs, rubs, gallops RESPIRATORY:  Clear to auscultation without rales, wheezing or rhonchi  ABDOMEN: Soft, non-tender, non-distended EXTREMITIES:  No edema; No deformity   ASSESSMENT AND PLAN ***    {Are you ordering a CV Procedure (e.g. stress test, cath, DCCV, TEE, etc)?   Press F2        :[id number]}  Dispo: ***  Signed, Donnice DELENA Primus, MD  "

## 2024-04-25 NOTE — Progress Notes (Signed)
 OT Cancellation Note  Patient Details Name: [name]. MRN: [id number] DOB: Jul 29, 1960   Cancelled Treatment:    Reason Eval/Treat Not Completed: Other (comment) Chart reviewed. Due to medical decline and comfort status, pt no longer appropriate for therapy interventions. Will DC at this time.   Jhonny Pelton, M.S., OTR/L Apr 14, 2024, 8:05 AM

## 2024-04-25 DEATH — deceased

## 2024-05-18 ENCOUNTER — Ambulatory Visit: Admitting: Family

## 2024-06-08 ENCOUNTER — Ambulatory Visit: Admitting: Family Medicine

## 2024-06-14 ENCOUNTER — Ambulatory Visit: Admitting: Oncology

## 2024-06-14 ENCOUNTER — Other Ambulatory Visit
